# Patient Record
Sex: Male | Born: 1985
Health system: Southern US, Community
[De-identification: ages and names within clinical notes are randomized; demographics above are authoritative.]

## PROBLEM LIST (undated history)

## (undated) DIAGNOSIS — N186 End stage renal disease: Secondary | ICD-10-CM

## (undated) DIAGNOSIS — J189 Pneumonia, unspecified organism: Secondary | ICD-10-CM

## (undated) DIAGNOSIS — T462X1A Poisoning by other antidysrhythmic drugs, accidental (unintentional), initial encounter: Secondary | ICD-10-CM

## (undated) DIAGNOSIS — N289 Disorder of kidney and ureter, unspecified: Secondary | ICD-10-CM

## (undated) DIAGNOSIS — R7303 Prediabetes: Secondary | ICD-10-CM

## (undated) DIAGNOSIS — D649 Anemia, unspecified: Secondary | ICD-10-CM

## (undated) DIAGNOSIS — I4892 Unspecified atrial flutter: Secondary | ICD-10-CM

## (undated) DIAGNOSIS — I428 Other cardiomyopathies: Secondary | ICD-10-CM

## (undated) DIAGNOSIS — I480 Paroxysmal atrial fibrillation: Secondary | ICD-10-CM

## (undated) DIAGNOSIS — I5082 Biventricular heart failure: Secondary | ICD-10-CM

## (undated) DIAGNOSIS — E662 Morbid (severe) obesity with alveolar hypoventilation: Secondary | ICD-10-CM

## (undated) DIAGNOSIS — J9611 Chronic respiratory failure with hypoxia: Secondary | ICD-10-CM

## (undated) DIAGNOSIS — Z992 Dependence on renal dialysis: Secondary | ICD-10-CM

## (undated) HISTORY — PX: CARDIAC CATHETERIZATION: SHX172

## (undated) HISTORY — PX: ABSCESS DRAINAGE: SHX1119

## (undated) NOTE — *Deleted (*Deleted)
***In Progress*** PCP: [facility] Primary Cardiologist: Dr Haroldine Laws  HPI:   25 y.o.with history of cardiomyopathy and obesity. He has been known to have a cardiomyopathy (biventricular failure) since back in 2019, echo in 2019 with EF 20% [facility]). He has been followed in the [facility] system.   Admitted with increased A/C Systolic HF. Diuresed with IV furosemide and transitioned to torsemide. Placed on milrinone and had RHC/LHC. Cath showed no CAD and volume overload.  Blood gas revealed a CO2 level elevated to 78. BiPAP was initiated. HF team recommended to d/c on Farxiga 10 mg daily, digoxin 0.125 mg daily,  Corlanor 5 mg BID, losartan 12.5 mg BID, KCl 20 mEq BID, torsemide 60 mg daily, and spironolactone 25 mg daily.  He was not discharged on torsemide but discharged on all other recommended meds.  Discharge weight was 417 pounds.   He returned to clinic for HF follow up with Darrick Grinder, NP, on 04/29/20. Overall feeling much better. Able to walk up steps. Mild SOB with steps. Denied PND/Orthopnea. Using Bipap nightly.  Appetite ok. Drinking lots of fluids. No fever or chills. Weight at home 419-432 pounds. Taking all medications. Living with his mom and uncle. Drives a bus full time.  Today he returns to HF clinic for pharmacist medication titration. At last visit with Darrick Grinder, NP, on 04/29/20, carvedilol 3.125 mg BID was initiated.   Overall feeling ***. Dizziness, lightheadedness, fatigue:  Chest pain or palpitations.  How is your breathing?: *** SOB Able to complete all ADLs. Activity level ***  Weight at home pounds. Takes furosemide/torsemide/bumex *** mg *** daily.  PND/Orthopnea:   Appetite ***.   HF Medications: Carvedilol 3.125 mg BID  Entresto 24/26 mg BID  Spironolactone 25 mg daily  Dapagliflozin 10 mg daily  Digoxin 0.125 mg daily Ivabradine 5 mg BID  Torsemide 60 mg daily  Potassium Chloride 20 mEq BID  Has the patient been experiencing any side  effects to the medications prescribed?  {YES NO:22349}  Does the patient have any problems obtaining medications due to transportation or finances?   {YES [employer]  Understanding of regimen: {excellent/good/fair/poor:19665} Understanding of indications: {excellent/good/fair/poor:19665} Potential of compliance: {excellent/good/fair/poor:19665} Patient understands to avoid NSAIDs. Patient understands to avoid decongestants.    Pertinent Lab Values 04/29/20: Serum creatinine 1.07, BUN 8, Potassium 3.7, Sodium 134, BNP 368.5 (02/26/20)   Vital Signs: . Weight: *** (last clinic weight: 428 lbs) . Blood pressure: ***  . Heart rate: ***   Assessment:  1. Chronic systolic CHF: Known cardiomyopathy since 2019. Echo 02/27/2020 his admission with EF <20%, moderate LV dilation, severely decreased RV function with severe RV dilation, severe biatrial enlargement, mild MR. No history of ETOH/drugs. No FH of cardiomyopathy (mother with [place]). 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 Suspect he is too large for cardiac MRI. Narrow QRS on ECG so not CRT candidate. RHC/LHC on 03/04/20 with no CAD (nonischemic cardiomyopathy), elevated left and right heart filling pressures, and low CI at 2.1. Started on milrinone. Milrinone weaned and discontinued8/15/21. - NYHA II. Volume status stable. Continue  torsemide 60 mg once daily. Discussed limiting fluid intake to < 2 liter per day.  - Continue carvedilol 3.125 mg BID - Continue Ivabradine 5 mg BID.  - Continue digoxin 0.125 daily - Continue spironolactone 25 mg daily  - Continue Entresto 24-26 mg twice a day.  - Continue dapagliflozin 10 mg daily. - Plan to repeat ECHO in November with  Dr [name].   2. OSA: Hypercarbic respiratory failure in hospital.  -Continue Bipap every night.   3.Type 2Diabetes: - Hgb A1c 6.8 - Started dapagliflozin 03/08/20 - Followed by  primary care.    4. Recent AKI: Creatinine up to 1.5. Suspect cardiorenal +/- contrast from cath this admit - Creatinine now at baseline of 1.1  5. Hyponatremia, Na 134 on 04/29/20, now stable  6. 2nd Degree AV Block, Type 2   7. Obesity  Body mass index is 58.07 kg/m. Discussed portion control.    Plan: 1) Medication changes: Based on clinical presentation, vital signs and recent labs will *** 2) Labs: *** 3) Follow-up with Dr. [name] in 3 weeks   Audry Riles, PharmD, BCPS, BCCP, CPP Heart Failure Clinic Pharmacist [phone]

---

## 2015-03-11 ENCOUNTER — Ambulatory Visit: Payer: No Typology Code available for payment source | Attending: Chiropractic Medicine | Admitting: Physical Therapy

## 2015-04-07 ENCOUNTER — Ambulatory Visit: Payer: Self-pay | Attending: Chiropractic Medicine | Admitting: Physical Therapy

## 2015-04-07 ENCOUNTER — Encounter: Payer: Self-pay | Admitting: Physical Therapy

## 2015-04-07 DIAGNOSIS — M545 Low back pain, unspecified: Secondary | ICD-10-CM

## 2015-04-07 DIAGNOSIS — M629 Disorder of muscle, unspecified: Secondary | ICD-10-CM | POA: Insufficient documentation
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
## 2015-04-09 ENCOUNTER — Encounter: Payer: Self-pay | Admitting: Physical Therapy

## 2015-04-09 ENCOUNTER — Ambulatory Visit: Payer: Self-pay | Admitting: Physical Therapy

## 2015-04-09 DIAGNOSIS — M545 Low back pain, unspecified: Secondary | ICD-10-CM

## 2015-04-09 DIAGNOSIS — M629 Disorder of muscle, unspecified: Secondary | ICD-10-CM

## 2015-04-09 NOTE — Therapy (Signed)
[place] [address] [place], [place], [address] Phone: [phone]   Fax:  [phone]  Physical Therapy Treatment  Patient Details  Name: Phillip Young MRN: [id number] Date of Birth: [birth year] Referring Provider:  Reinaldo Meeker, DC  Encounter Date: 04/09/2015      PT End of Session - 04/09/15 1600    Visit Number 2   Date for PT Re-Evaluation 06/07/15   PT Start Time 1529   PT Stop Time 1600   PT Time Calculation (min) 31 min   Activity Tolerance Patient tolerated treatment well   Behavior During Therapy [facility] for tasks assessed/performed      History reviewed. No pertinent past medical history.  History reviewed. No pertinent past surgical history.  There were no vitals filed for this visit.  Visit Diagnosis:  Hamstring tightness of both lower extremities  Midline low back pain without sciatica      Subjective Assessment - 04/09/15 1530    Subjective Pt report waking up stiff. Pt report mild LBP after last treatment on his drive home.                          [facility] Adult PT Treatment/Exercise - 04/09/15 0001    Lumbar Exercises: Aerobic   Elliptical I 10 R 5 x 5 min    Lumbar Exercises: Machines for Strengthening   Cybex Knee Extension #45 3X10    Cybex Knee Flexion 55# 3x10   Leg Press #100 3X10    Other Lumbar Machine Exercise seated rows 65#, Lats 65# 2x 15 each   Other Lumbar Machine Exercise Standing AR press #55 10 reps 2 sets, Standing straight arm pull downs #75 3X15   Manual Therapy   Manual Therapy Passive ROM   Manual therapy comments Tight hamstrings R>L, PROM taken to end range and held   Passive ROM bilat Hip flex and ext.                  PT Short Term Goals - 04/09/15 1602    PT SHORT TERM GOAL #1   Title independent with initial HEP   Status Partially Met           PT Long Term Goals - 04/07/15 1440    PT LONG TERM GOAL #1   Title  understand proper posture and body mechanics   Time 8   Period Weeks   Status New   PT LONG TERM GOAL #2   Title decrease pain 50%   Time 8   Period Weeks   Status New   PT LONG TERM GOAL #3   Title increase ROM of the lumbar spine tpo WFL's   Time 8   Period Weeks   Status New   PT LONG TERM GOAL #4   Title lift 75# from the floor   Time 8   Period Weeks   Status New               Plan - 04/09/15 1601    Clinical Impression Statement Pt 14 minutes late for PT treatment. Pt tolerated gym level exercises well and shows good strength. Pt does show signs of fatigue with exercises. Pt bilat hamstrings very tight L>R. Pt reports low back stiffness throughout treatment   Pt will benefit from skilled therapeutic intervention in order to improve on the following deficits Cardiopulmonary status limiting activity;Decreased activity tolerance;Decreased range of motion;Increased muscle spasms;Pain  Rehab Potential Good   PT Frequency 2x / week   PT Duration 8 weeks   PT Treatment/Interventions Moist Heat;Electrical Stimulation;Traction;Therapeutic exercise;Therapeutic activities;Manual techniques;Patient/family education        Problem List There are no active problems to display for this patient.   Scot Jun, PTA  04/09/2015, 4:04 PM  [place] [address] [place], [place], [address] Phone: [phone]   Fax:  [phone]

## 2015-04-14 ENCOUNTER — Ambulatory Visit: Payer: Self-pay | Admitting: Physical Therapy

## 2015-04-17 ENCOUNTER — Encounter: Payer: Self-pay | Admitting: Physical Therapy

## 2015-04-17 ENCOUNTER — Ambulatory Visit: Payer: Self-pay | Admitting: Physical Therapy

## 2015-04-17 DIAGNOSIS — M629 Disorder of muscle, unspecified: Secondary | ICD-10-CM

## 2015-04-17 DIAGNOSIS — M545 Low back pain, unspecified: Secondary | ICD-10-CM

## 2015-04-17 NOTE — Therapy (Signed)
[place] [address] [place], [place], [address] Phone: [phone]   Fax:  [phone]  Physical Therapy Treatment  Patient Details  Name: Kaizen Ibsen MRN: [id number] Date of Birth: 1986-03-05 Referring Provider:  Reinaldo Meeker, DC  Encounter Date: 04/17/2015      PT End of Session - 04/17/15 1430    Visit Number 2   Date for PT Re-Evaluation 06/07/15   PT Start Time 5465   PT Stop Time 1430   PT Time Calculation (min) 31 min   Activity Tolerance Patient tolerated treatment well   Behavior During Therapy [facility] for tasks assessed/performed      History reviewed. No pertinent past medical history.  History reviewed. No pertinent past surgical history.  There were no vitals filed for this visit.  Visit Diagnosis:  Hamstring tightness of both lower extremities  Midline low back pain without sciatica      Subjective Assessment - 04/17/15 1402    Subjective Pt reports that he has no pain today but does have some stiffness in low back. Pt does report little low back pain in the mornings.   Currently in Pain? No/denies   Pain Score 0-No pain                         [facility] Adult PT Treatment/Exercise - 04/17/15 0001    Lumbar Exercises: Aerobic   Elliptical I 10 R 5 x 6 min    Lumbar Exercises: Machines for Strengthening   Cybex Knee Extension #65 2X15    Cybex Knee Flexion 65# 2x15   Leg Press #120 3X15   Other Lumbar Machine Exercise seated rows 85# 3X15, Lats 85# 2x15    Other Lumbar Machine Exercise Standing AR press #55 10 reps 2 sets, Standing straight arm pull downs #75 3X15   Lumbar Exercises: Standing   Other Standing Lumbar Exercises Dead lifts #20 2X15   Shoulder Exercises: Standing   Row 15 reps;Both;PROM;Strengthening;Weights  2 sets    Row Weight (lbs) 85                  PT Short Term Goals - 04/09/15 1602    PT SHORT TERM GOAL #1   Title independent with  initial HEP   Status Partially Met           PT Long Term Goals - 04/17/15 1434    PT LONG TERM GOAL #1   Title understand proper posture and body mechanics   Status Partially Met               Plan - 04/17/15 1432    Clinical Impression Statement Pr again 15 minutes late for PR treatment. Reports tightness and pain in his low back in the morning that improves once he gets moving. Pt continues to have decrease activity tolerance with exercises but displays good strength.   Pt will benefit from skilled therapeutic intervention in order to improve on the following deficits Cardiopulmonary status limiting activity;Decreased activity tolerance;Decreased range of motion;Increased muscle spasms;Pain   Rehab Potential Good   PT Frequency 2x / week   PT Duration 8 weeks   PT Treatment/Interventions Moist Heat;Electrical Stimulation;Traction;Therapeutic exercise;Therapeutic activities;Manual techniques;Patient/family education   PT Next Visit Plan continue to add exercises        Problem List There are no active problems to display for this patient.   Scot Jun, PTA  04/17/2015, 2:34 PM  [place] [address] [place], [place], [address] Phone: [phone]   Fax:  [phone]

## 2015-04-21 ENCOUNTER — Ambulatory Visit: Payer: Self-pay | Admitting: Physical Therapy

## 2015-04-21 ENCOUNTER — Encounter: Payer: Self-pay | Admitting: Physical Therapy

## 2015-04-21 DIAGNOSIS — M545 Low back pain, unspecified: Secondary | ICD-10-CM

## 2015-04-21 DIAGNOSIS — M629 Disorder of muscle, unspecified: Secondary | ICD-10-CM

## 2015-04-21 NOTE — Therapy (Signed)
[place] [address] [place], [place], [address] Phone: [phone]   Fax:  [phone]  Physical Therapy Treatment  Patient Details  Name: Phillip Young MRN: [id number] Date of Birth: 09-03-1985 Referring Sarina Robleto:  Reinaldo Meeker, DC  Encounter Date: 04/21/2015      PT End of Session - 04/21/15 0920    Visit Number 3   Date for PT Re-Evaluation 06/07/15   PT Start Time 0838   PT Stop Time 0920   PT Time Calculation (min) 42 min   Activity Tolerance Patient tolerated treatment well;Patient limited by fatigue   Behavior During Therapy [facility] for tasks assessed/performed      History reviewed. No pertinent past medical history.  History reviewed. No pertinent past surgical history.  There were no vitals filed for this visit.  Visit Diagnosis:  Hamstring tightness of both lower extremities  Midline low back pain without sciatica      Subjective Assessment - 04/21/15 0839    Subjective Pt reports mild LPB when he breathes, Pt also reports that he cant sleep on his side.    Pain Score 5    Pain Location Back   Pain Orientation Lower                         [facility] Adult PT Treatment/Exercise - 04/21/15 0001    Exercises   Exercises Lumbar;Knee/Hip   Lumbar Exercises: Aerobic   Elliptical I 10 R 5 x 7 min    Lumbar Exercises: Machines for Strengthening   Cybex Knee Extension #75 2X15    Cybex Knee Flexion 75# 2x15   Leg Press #140 3X15   Other Lumbar Machine Exercise seated rows 95# 3X10, Lats 95# 3x10    Other Lumbar Machine Exercise Standing AR press #55 10 reps 2 sets, Standing straight arm pull downs #75 3X15   Lumbar Exercises: Standing   Other Standing Lumbar Exercises Functional carry 25lb x4, 3 flights of stairs #20    Lumbar Exercises: Prone   Other Prone Lumbar Exercises Plank 30 sex X3                   PT Short Term Goals - 04/21/15 3893    PT SHORT TERM GOAL  #1   Title independent with initial HEP   Status Achieved           PT Long Term Goals - 04/17/15 1434    PT LONG TERM GOAL #1   Title understand proper posture and body mechanics   Status Partially Met               Plan - 04/21/15 0920    Clinical Impression Statement Pt demo ed a decrease activity tolerance this day with exercises. Pt shows good strength but requires multiple rest breaks. Pt able to progress to functional carry as well as stair negotiation with weight.   Pt will benefit from skilled therapeutic intervention in order to improve on the following deficits Cardiopulmonary status limiting activity;Decreased activity tolerance;Decreased range of motion;Increased muscle spasms;Pain   PT Frequency 2x / week   PT Duration 8 weeks   PT Treatment/Interventions Moist Heat;Electrical Stimulation;Traction;Therapeutic exercise;Therapeutic activities;Manual techniques;Patient/family education   PT Next Visit Plan Core strengthening         Problem List There are no active problems to display for this patient.   Scot Jun , PTA   04/21/2015, 9:22 AM  [facility]  [address] [place], [place], [address] Phone: [phone]   Fax:  [phone]

## 2015-04-25 ENCOUNTER — Ambulatory Visit: Payer: Self-pay | Admitting: Physical Therapy

## 2015-04-25 ENCOUNTER — Encounter: Payer: Self-pay | Admitting: Physical Therapy

## 2015-04-25 DIAGNOSIS — M545 Low back pain, unspecified: Secondary | ICD-10-CM

## 2015-04-25 DIAGNOSIS — M629 Disorder of muscle, unspecified: Secondary | ICD-10-CM

## 2015-04-25 NOTE — Therapy (Addendum)
[place] [address] [place], [place], [address] Phone: [phone]   Fax:  [phone]  Physical Therapy Treatment  Patient Details  Name: Phillip Young MRN: [id number] Date of Birth: [birth year] Referring Provider:  Reinaldo Meeker, DC  Encounter Date: 04/25/2015      PT End of Session - 04/25/15 0926    Visit Number 4   Date for PT Re-Evaluation 06/07/15   PT Start Time 3790   PT Stop Time 0927   PT Time Calculation (min) 32 min   Activity Tolerance Patient tolerated treatment well;Patient limited by fatigue   Behavior During Therapy [facility] for tasks assessed/performed      History reviewed. No pertinent past medical history.  History reviewed. No pertinent past surgical history.  There were no vitals filed for this visit.  Visit Diagnosis:  Hamstring tightness of both lower extremities  Midline low back pain without sciatica      Subjective Assessment - 04/25/15 0852    Subjective Pt reports that everything is good but their is always pain   Diagnostic tests x-rays negative   Currently in Pain? Yes   Pain Score 2    Pain Location Back                         [facility] Adult PT Treatment/Exercise - 04/25/15 0001    Lumbar Exercises: Aerobic   Elliptical I 10 R 10 x 7 min    Lumbar Exercises: Machines for Strengthening   Cybex Knee Extension #85 3X15    Cybex Knee Flexion 85# 2x15   Leg Press #160 3X15   Other Lumbar Machine Exercise seated rows 105# 3X10, Lats 105# 3x10    Other Lumbar Machine Exercise Standing trunk rotation #45 2X10    Lumbar Exercises: Standing   Other Standing Lumbar Exercises Functional carry 25lb x2 up and down stairs    Lumbar Exercises: Prone   Other Prone Lumbar Exercises Plank 30 sex X3                   PT Short Term Goals - 04/21/15 2409    PT SHORT TERM GOAL #1   Title independent with initial HEP   Status Achieved           PT  Long Term Goals - 04/25/15 0929    PT LONG TERM GOAL #2   Title decrease pain 50%   Status Partially Met   PT LONG TERM GOAL #3   Title increase ROM of the lumbar spine tpo WFL's   Status Partially Met               Plan - 04/25/15 0927    Clinical Impression Statement Pt 10 minutes late for PT treatment. Pt tolerated all activities well. Pt able shows good strength and able to progress to functional carry up and down stairs. Pt reports all mobility at home is good but he still has LBP   Pt will benefit from skilled therapeutic intervention in order to improve on the following deficits Cardiopulmonary status limiting activity;Decreased activity tolerance;Decreased range of motion;Increased muscle spasms;Pain   Rehab Potential Good   PT Frequency 2x / week   PT Duration 8 weeks   PT Treatment/Interventions Moist Heat;Electrical Stimulation;Traction;Therapeutic exercise;Therapeutic activities;Manual techniques;Patient/family education   PT Next Visit Plan Core strengthening        PHYSICAL THERAPY DISCHARGE SUMMARY  Visits from Start of Care: 4  Plan: Patient agrees to discharge.  Patient goals were partially met. Patient is being discharged due to not returning since the last visit.  ?????       Problem List There are no active problems to display for this patient.   Scot Jun, PTA  04/25/2015, 9:30 AM  [name] [place], [place], [address] Phone: [phone]   Fax:  [phone]

## 2015-04-30 ENCOUNTER — Ambulatory Visit: Payer: Self-pay | Attending: Chiropractic Medicine | Admitting: Physical Therapy

## 2015-04-30 ENCOUNTER — Encounter: Payer: Self-pay | Admitting: Physical Therapy

## 2015-04-30 DIAGNOSIS — M629 Disorder of muscle, unspecified: Secondary | ICD-10-CM | POA: Insufficient documentation

## 2015-04-30 DIAGNOSIS — M545 Low back pain, unspecified: Secondary | ICD-10-CM

## 2015-04-30 NOTE — Therapy (Signed)
[address] [place], [place], [address] Phone: [phone]   Fax:  [phone]  Physical Therapy Treatment  Patient Details  Name: Phillip Young MRN: [id number] Date of Birth: 1986/01/12 Referring Provider:  Reinaldo Meeker, DC  Encounter Date: 04/30/2015      PT End of Session - 04/30/15 0922    Visit Number 5   Date for PT Re-Evaluation 06/07/15   PT Start Time 0840   PT Stop Time 0922   PT Time Calculation (min) 42 min   Activity Tolerance Patient tolerated treatment well;Patient limited by fatigue   Behavior During Therapy [facility] for tasks assessed/performed      History reviewed. No pertinent past medical history.  History reviewed. No pertinent past surgical history.  There were no vitals filed for this visit.  Visit Diagnosis:  Hamstring tightness of both lower extremities  Midline low back pain without sciatica      Subjective Assessment - 04/30/15 0840    Subjective Pt reports pain in low back, but has minor improvements    Currently in Pain? Yes   Pain Score 2    Pain Location Back                         [facility] Adult PT Treatment/Exercise - 04/30/15 0001    Lumbar Exercises: Stretches   Passive Hamstring Stretch 5 reps;10 seconds   Single Knee to Chest Stretch 1 rep;10 seconds   Quad Stretch 1 rep;10 seconds   Piriformis Stretch 10 seconds;1 rep   Lumbar Exercises: Aerobic   Elliptical I 10 R 10 x 7 min    Lumbar Exercises: Machines for Strengthening   Cybex Knee Extension #85 2X15    Cybex Knee Flexion 85# 2x15   Leg Press #200 3X15   Other Lumbar Machine Exercise seated rows 115# 2X15, Lats 115# 3x10    Other Lumbar Machine Exercise Standing trunk rotation #45 2X10    Lumbar Exercises: Standing   Row Power tower;AROM;Strengthening;Both;15 reps  2 sets   Row Limitations 105lb   Other Standing Lumbar Exercises Functional carry 25lb x2 up and down stairs    Lumbar Exercises: Prone   Other Prone Lumbar Exercises Plank 45 sec X3                   PT Short Term Goals - 04/21/15 [id number]    PT SHORT TERM GOAL #1   Title independent with initial HEP   Status Achieved           PT Long Term Goals - 04/30/15 0924    PT LONG TERM GOAL #1   Title understand proper posture and body mechanics   Status Achieved               Plan - 04/30/15 0923    Clinical Impression Statement Pt continues to report low back pain, tight bilat hamstrings. Demos good strength and able to complete all interventions. Fatigues easily frequent rest breaks.   Pt will benefit from skilled therapeutic intervention in order to improve on the following deficits Cardiopulmonary status limiting activity;Decreased activity tolerance;Decreased range of motion;Increased muscle spasms;Pain   Rehab Potential Good   PT Frequency 2x / week   PT Duration 8 weeks   PT Treatment/Interventions Moist Heat;Electrical Stimulation;Traction;Therapeutic exercise;Therapeutic activities;Manual techniques;Patient/family education   PT Next Visit Plan Core strengthening         Problem List There are no  active problems to display for this patient.   Scot Jun, PTA   04/30/2015, 9:25 AM  [address] [place], [place], [address] Phone: [phone]   Fax:  [phone]

## 2019-10-15 DIAGNOSIS — G4730 Sleep apnea, unspecified: Secondary | ICD-10-CM | POA: Diagnosis not present

## 2019-10-15 DIAGNOSIS — I5022 Chronic systolic (congestive) heart failure: Secondary | ICD-10-CM | POA: Diagnosis not present

## 2019-10-15 DIAGNOSIS — D508 Other iron deficiency anemias: Secondary | ICD-10-CM | POA: Diagnosis not present

## 2019-10-15 DIAGNOSIS — I110 Hypertensive heart disease with heart failure: Secondary | ICD-10-CM | POA: Diagnosis not present

## 2019-11-01 DIAGNOSIS — R05 Cough: Secondary | ICD-10-CM | POA: Diagnosis not present

## 2019-11-01 DIAGNOSIS — D509 Iron deficiency anemia, unspecified: Secondary | ICD-10-CM | POA: Diagnosis not present

## 2019-11-01 DIAGNOSIS — Z6841 Body Mass Index (BMI) 40.0 and over, adult: Secondary | ICD-10-CM | POA: Diagnosis not present

## 2019-11-05 DIAGNOSIS — I517 Cardiomegaly: Secondary | ICD-10-CM | POA: Diagnosis not present

## 2019-11-05 DIAGNOSIS — R05 Cough: Secondary | ICD-10-CM | POA: Diagnosis not present

## 2019-11-12 DIAGNOSIS — R1310 Dysphagia, unspecified: Secondary | ICD-10-CM | POA: Diagnosis not present

## 2019-11-12 DIAGNOSIS — R05 Cough: Secondary | ICD-10-CM | POA: Diagnosis not present

## 2019-11-12 DIAGNOSIS — D509 Iron deficiency anemia, unspecified: Secondary | ICD-10-CM | POA: Diagnosis not present

## 2019-12-04 DIAGNOSIS — G4730 Sleep apnea, unspecified: Secondary | ICD-10-CM | POA: Diagnosis not present

## 2019-12-04 DIAGNOSIS — R0681 Apnea, not elsewhere classified: Secondary | ICD-10-CM | POA: Diagnosis not present

## 2019-12-04 DIAGNOSIS — R400 Somnolence: Secondary | ICD-10-CM | POA: Diagnosis not present

## 2019-12-10 DIAGNOSIS — K449 Diaphragmatic hernia without obstruction or gangrene: Secondary | ICD-10-CM | POA: Diagnosis not present

## 2019-12-19 DIAGNOSIS — R0789 Other chest pain: Secondary | ICD-10-CM | POA: Diagnosis not present

## 2019-12-19 DIAGNOSIS — I5020 Unspecified systolic (congestive) heart failure: Secondary | ICD-10-CM | POA: Diagnosis not present

## 2019-12-19 DIAGNOSIS — Z87891 Personal history of nicotine dependence: Secondary | ICD-10-CM | POA: Diagnosis not present

## 2019-12-19 DIAGNOSIS — I5023 Acute on chronic systolic (congestive) heart failure: Secondary | ICD-10-CM | POA: Diagnosis not present

## 2019-12-19 DIAGNOSIS — Z6841 Body Mass Index (BMI) 40.0 and over, adult: Secondary | ICD-10-CM | POA: Diagnosis not present

## 2019-12-19 DIAGNOSIS — L732 Hidradenitis suppurativa: Secondary | ICD-10-CM | POA: Diagnosis not present

## 2019-12-19 DIAGNOSIS — D649 Anemia, unspecified: Secondary | ICD-10-CM | POA: Diagnosis not present

## 2019-12-19 DIAGNOSIS — I420 Dilated cardiomyopathy: Secondary | ICD-10-CM | POA: Diagnosis not present

## 2019-12-19 DIAGNOSIS — K219 Gastro-esophageal reflux disease without esophagitis: Secondary | ICD-10-CM | POA: Diagnosis not present

## 2019-12-19 DIAGNOSIS — I110 Hypertensive heart disease with heart failure: Secondary | ICD-10-CM | POA: Diagnosis not present

## 2019-12-19 DIAGNOSIS — L0591 Pilonidal cyst without abscess: Secondary | ICD-10-CM | POA: Diagnosis not present

## 2019-12-19 DIAGNOSIS — R1013 Epigastric pain: Secondary | ICD-10-CM | POA: Diagnosis not present

## 2019-12-19 DIAGNOSIS — R000 Tachycardia, unspecified: Secondary | ICD-10-CM | POA: Diagnosis not present

## 2019-12-19 DIAGNOSIS — Z79899 Other long term (current) drug therapy: Secondary | ICD-10-CM | POA: Diagnosis not present

## 2019-12-19 DIAGNOSIS — R109 Unspecified abdominal pain: Secondary | ICD-10-CM | POA: Diagnosis not present

## 2019-12-19 DIAGNOSIS — R072 Precordial pain: Secondary | ICD-10-CM | POA: Diagnosis not present

## 2019-12-19 DIAGNOSIS — I517 Cardiomegaly: Secondary | ICD-10-CM | POA: Diagnosis not present

## 2019-12-19 DIAGNOSIS — L0501 Pilonidal cyst with abscess: Secondary | ICD-10-CM | POA: Diagnosis not present

## 2019-12-19 DIAGNOSIS — R0602 Shortness of breath: Secondary | ICD-10-CM | POA: Diagnosis not present

## 2019-12-19 DIAGNOSIS — R05 Cough: Secondary | ICD-10-CM | POA: Diagnosis not present

## 2019-12-19 DIAGNOSIS — G4733 Obstructive sleep apnea (adult) (pediatric): Secondary | ICD-10-CM | POA: Diagnosis not present

## 2019-12-23 MED ORDER — LOSARTAN POTASSIUM 25 MG PO TABS
12.50 | ORAL_TABLET | ORAL | Status: DC
Start: 2019-12-23 — End: 2019-12-23

## 2019-12-23 MED ORDER — PROMETHAZINE HCL 25 MG PO TABS
25.00 | ORAL_TABLET | ORAL | Status: DC
Start: ? — End: 2019-12-23

## 2019-12-23 MED ORDER — ONDANSETRON HCL 4 MG/2ML IJ SOLN
4.00 | INTRAMUSCULAR | Status: DC
Start: ? — End: 2019-12-23

## 2019-12-23 MED ORDER — SPIRONOLACTONE 25 MG PO TABS
12.50 | ORAL_TABLET | ORAL | Status: DC
Start: 2019-12-23 — End: 2019-12-23

## 2019-12-23 MED ORDER — METOPROLOL SUCCINATE ER 25 MG PO TB24
25.00 | ORAL_TABLET | ORAL | Status: DC
Start: 2019-12-23 — End: 2019-12-23

## 2019-12-23 MED ORDER — OXYCODONE-ACETAMINOPHEN 10-325 MG PO TABS
1.00 | ORAL_TABLET | ORAL | Status: DC
Start: ? — End: 2019-12-23

## 2019-12-23 MED ORDER — FAMOTIDINE 20 MG PO TABS
20.00 | ORAL_TABLET | ORAL | Status: DC
Start: 2019-12-22 — End: 2019-12-23

## 2019-12-23 MED ORDER — ACETAMINOPHEN 325 MG PO TABS
650.00 | ORAL_TABLET | ORAL | Status: DC
Start: ? — End: 2019-12-23

## 2019-12-23 MED ORDER — MORPHINE SULFATE 2 MG/ML IJ SOLN
2.00 | INTRAMUSCULAR | Status: DC
Start: ? — End: 2019-12-23

## 2019-12-23 MED ORDER — FUROSEMIDE 10 MG/ML IJ SOLN
40.00 | INTRAMUSCULAR | Status: DC
Start: 2019-12-23 — End: 2019-12-23

## 2019-12-23 MED ORDER — HYDROCODONE-ACETAMINOPHEN 5-325 MG PO TABS
1.00 | ORAL_TABLET | ORAL | Status: DC
Start: ? — End: 2019-12-23

## 2019-12-23 MED ORDER — BENZONATATE 100 MG PO CAPS
100.00 | ORAL_CAPSULE | ORAL | Status: DC
Start: ? — End: 2019-12-23

## 2019-12-23 MED ORDER — BENZOCAINE-MENTHOL 6-10 MG MT LOZG
1.00 | LOZENGE | OROMUCOSAL | Status: DC
Start: ? — End: 2019-12-23

## 2020-01-07 DIAGNOSIS — L0501 Pilonidal cyst with abscess: Secondary | ICD-10-CM | POA: Diagnosis not present

## 2020-01-07 DIAGNOSIS — Z8679 Personal history of other diseases of the circulatory system: Secondary | ICD-10-CM | POA: Diagnosis not present

## 2020-01-16 DIAGNOSIS — I509 Heart failure, unspecified: Secondary | ICD-10-CM | POA: Diagnosis not present

## 2020-01-16 DIAGNOSIS — L0501 Pilonidal cyst with abscess: Secondary | ICD-10-CM | POA: Diagnosis not present

## 2020-01-16 DIAGNOSIS — L732 Hidradenitis suppurativa: Secondary | ICD-10-CM | POA: Diagnosis not present

## 2020-01-16 DIAGNOSIS — E119 Type 2 diabetes mellitus without complications: Secondary | ICD-10-CM | POA: Diagnosis not present

## 2020-02-18 DIAGNOSIS — J90 Pleural effusion, not elsewhere classified: Secondary | ICD-10-CM | POA: Diagnosis not present

## 2020-02-18 DIAGNOSIS — R109 Unspecified abdominal pain: Secondary | ICD-10-CM | POA: Diagnosis not present

## 2020-02-18 DIAGNOSIS — R0602 Shortness of breath: Secondary | ICD-10-CM | POA: Diagnosis not present

## 2020-02-18 DIAGNOSIS — R05 Cough: Secondary | ICD-10-CM | POA: Diagnosis not present

## 2020-02-18 DIAGNOSIS — I517 Cardiomegaly: Secondary | ICD-10-CM | POA: Diagnosis not present

## 2020-02-18 DIAGNOSIS — R1084 Generalized abdominal pain: Secondary | ICD-10-CM | POA: Diagnosis not present

## 2020-02-18 DIAGNOSIS — M7989 Other specified soft tissue disorders: Secondary | ICD-10-CM | POA: Diagnosis not present

## 2020-02-19 DIAGNOSIS — M7989 Other specified soft tissue disorders: Secondary | ICD-10-CM | POA: Diagnosis not present

## 2020-02-19 DIAGNOSIS — R05 Cough: Secondary | ICD-10-CM | POA: Diagnosis not present

## 2020-02-19 DIAGNOSIS — R000 Tachycardia, unspecified: Secondary | ICD-10-CM | POA: Diagnosis not present

## 2020-02-19 DIAGNOSIS — R109 Unspecified abdominal pain: Secondary | ICD-10-CM | POA: Diagnosis not present

## 2020-02-19 DIAGNOSIS — R1084 Generalized abdominal pain: Secondary | ICD-10-CM | POA: Diagnosis not present

## 2020-02-19 DIAGNOSIS — R0602 Shortness of breath: Secondary | ICD-10-CM | POA: Diagnosis not present

## 2020-02-26 ENCOUNTER — Inpatient Hospital Stay (HOSPITAL_COMMUNITY)
Admission: EM | Admit: 2020-02-26 | Discharge: 2020-03-11 | DRG: 286 | Disposition: A | Discharge status: Home / Self Care | Payer: BC Managed Care – PPO | Attending: Internal Medicine | Admitting: Internal Medicine

## 2020-02-26 ENCOUNTER — Emergency Department (HOSPITAL_COMMUNITY): Payer: BC Managed Care – PPO

## 2020-02-26 ENCOUNTER — Encounter (HOSPITAL_COMMUNITY): Payer: Self-pay | Admitting: Internal Medicine

## 2020-02-26 ENCOUNTER — Other Ambulatory Visit: Payer: Self-pay

## 2020-02-26 DIAGNOSIS — E119 Type 2 diabetes mellitus without complications: Secondary | ICD-10-CM | POA: Diagnosis not present

## 2020-02-26 DIAGNOSIS — R7303 Prediabetes: Secondary | ICD-10-CM

## 2020-02-26 DIAGNOSIS — I371 Nonrheumatic pulmonary valve insufficiency: Secondary | ICD-10-CM | POA: Diagnosis not present

## 2020-02-26 DIAGNOSIS — I441 Atrioventricular block, second degree: Secondary | ICD-10-CM | POA: Diagnosis present

## 2020-02-26 DIAGNOSIS — I509 Heart failure, unspecified: Secondary | ICD-10-CM | POA: Diagnosis not present

## 2020-02-26 DIAGNOSIS — I517 Cardiomegaly: Secondary | ICD-10-CM | POA: Diagnosis not present

## 2020-02-26 DIAGNOSIS — R053 Chronic cough: Secondary | ICD-10-CM

## 2020-02-26 DIAGNOSIS — E662 Morbid (severe) obesity with alveolar hypoventilation: Secondary | ICD-10-CM | POA: Diagnosis present

## 2020-02-26 DIAGNOSIS — I5043 Acute on chronic combined systolic (congestive) and diastolic (congestive) heart failure: Secondary | ICD-10-CM | POA: Diagnosis not present

## 2020-02-26 DIAGNOSIS — I5023 Acute on chronic systolic (congestive) heart failure: Secondary | ICD-10-CM | POA: Diagnosis present

## 2020-02-26 DIAGNOSIS — Z8249 Family history of ischemic heart disease and other diseases of the circulatory system: Secondary | ICD-10-CM | POA: Diagnosis not present

## 2020-02-26 DIAGNOSIS — Z6841 Body Mass Index (BMI) 40.0 and over, adult: Secondary | ICD-10-CM

## 2020-02-26 DIAGNOSIS — I110 Hypertensive heart disease with heart failure: Secondary | ICD-10-CM | POA: Diagnosis not present

## 2020-02-26 DIAGNOSIS — R05 Cough: Secondary | ICD-10-CM | POA: Diagnosis not present

## 2020-02-26 DIAGNOSIS — I5082 Biventricular heart failure: Secondary | ICD-10-CM | POA: Diagnosis not present

## 2020-02-26 DIAGNOSIS — J9602 Acute respiratory failure with hypercapnia: Secondary | ICD-10-CM | POA: Diagnosis not present

## 2020-02-26 DIAGNOSIS — G4733 Obstructive sleep apnea (adult) (pediatric): Secondary | ICD-10-CM | POA: Diagnosis not present

## 2020-02-26 DIAGNOSIS — Z79899 Other long term (current) drug therapy: Secondary | ICD-10-CM

## 2020-02-26 DIAGNOSIS — I959 Hypotension, unspecified: Secondary | ICD-10-CM | POA: Diagnosis present

## 2020-02-26 DIAGNOSIS — J9610 Chronic respiratory failure, unspecified whether with hypoxia or hypercapnia: Secondary | ICD-10-CM | POA: Diagnosis not present

## 2020-02-26 DIAGNOSIS — Z87891 Personal history of nicotine dependence: Secondary | ICD-10-CM

## 2020-02-26 DIAGNOSIS — R0602 Shortness of breath: Secondary | ICD-10-CM | POA: Diagnosis not present

## 2020-02-26 DIAGNOSIS — E871 Hypo-osmolality and hyponatremia: Secondary | ICD-10-CM | POA: Diagnosis not present

## 2020-02-26 DIAGNOSIS — L732 Hidradenitis suppurativa: Secondary | ICD-10-CM | POA: Diagnosis present

## 2020-02-26 DIAGNOSIS — I429 Cardiomyopathy, unspecified: Secondary | ICD-10-CM | POA: Diagnosis not present

## 2020-02-26 DIAGNOSIS — Z20822 Contact with and (suspected) exposure to covid-19: Secondary | ICD-10-CM | POA: Diagnosis not present

## 2020-02-26 DIAGNOSIS — Z9119 Patient's noncompliance with other medical treatment and regimen: Secondary | ICD-10-CM

## 2020-02-26 DIAGNOSIS — E876 Hypokalemia: Secondary | ICD-10-CM

## 2020-02-26 DIAGNOSIS — I5022 Chronic systolic (congestive) heart failure: Secondary | ICD-10-CM

## 2020-02-26 DIAGNOSIS — N179 Acute kidney failure, unspecified: Secondary | ICD-10-CM | POA: Diagnosis not present

## 2020-02-26 DIAGNOSIS — K219 Gastro-esophageal reflux disease without esophagitis: Secondary | ICD-10-CM | POA: Diagnosis not present

## 2020-02-26 DIAGNOSIS — J9601 Acute respiratory failure with hypoxia: Secondary | ICD-10-CM | POA: Diagnosis present

## 2020-02-26 DIAGNOSIS — I10 Essential (primary) hypertension: Secondary | ICD-10-CM

## 2020-02-26 DIAGNOSIS — I340 Nonrheumatic mitral (valve) insufficiency: Secondary | ICD-10-CM | POA: Diagnosis not present

## 2020-02-26 DIAGNOSIS — T502X5A Adverse effect of carbonic-anhydrase inhibitors, benzothiadiazides and other diuretics, initial encounter: Secondary | ICD-10-CM | POA: Diagnosis present

## 2020-02-26 DIAGNOSIS — I428 Other cardiomyopathies: Secondary | ICD-10-CM | POA: Diagnosis not present

## 2020-02-26 HISTORY — DX: Biventricular heart failure: I50.82

## 2020-02-26 HISTORY — DX: Gastro-esophageal reflux disease without esophagitis: K21.9

## 2020-02-26 HISTORY — DX: Prediabetes: R73.03

## 2020-02-26 HISTORY — DX: Body Mass Index (BMI) 40.0 and over, adult: Z684

## 2020-02-26 HISTORY — DX: Essential (primary) hypertension: I10

## 2020-02-26 HISTORY — DX: Hidradenitis suppurativa: L73.2

## 2020-02-26 HISTORY — DX: Morbid (severe) obesity due to excess calories: E66.01

## 2020-02-26 HISTORY — DX: Obstructive sleep apnea (adult) (pediatric): G47.33

## 2020-02-26 HISTORY — DX: Acute on chronic systolic (congestive) heart failure: I50.23

## 2020-02-26 LAB — CBC WITH DIFFERENTIAL/PLATELET
Abs Immature Granulocytes: 0.04 10*3/uL (ref 0.00–0.07)
Basophils Absolute: 0.1 10*3/uL (ref 0.0–0.1)
Basophils Relative: 1 %
Eosinophils Absolute: 0.3 10*3/uL (ref 0.0–0.5)
Eosinophils Relative: 3 %
HCT: 40.8 % (ref 39.0–52.0)
Hemoglobin: 12.7 g/dL — ABNORMAL LOW (ref 13.0–17.0)
Immature Granulocytes: 1 %
Lymphocytes Relative: 17 %
Lymphs Abs: 1.4 10*3/uL (ref 0.7–4.0)
MCH: 28.5 pg (ref 26.0–34.0)
MCHC: 31.1 g/dL (ref 30.0–36.0)
MCV: 91.5 fL (ref 80.0–100.0)
Monocytes Absolute: 0.5 10*3/uL (ref 0.1–1.0)
Monocytes Relative: 6 %
Neutro Abs: 6.2 10*3/uL (ref 1.7–7.7)
Neutrophils Relative %: 72 %
Platelets: 313 10*3/uL (ref 150–400)
RBC: 4.46 MIL/uL (ref 4.22–5.81)
RDW: 15.9 % — ABNORMAL HIGH (ref 11.5–15.5)
WBC: 8.6 10*3/uL (ref 4.0–10.5)
nRBC: 0 % (ref 0.0–0.2)

## 2020-02-26 LAB — BASIC METABOLIC PANEL
Anion gap: 12 (ref 5–15)
BUN: 8 mg/dL (ref 6–20)
CO2: 29 mmol/L (ref 22–32)
Calcium: 9 mg/dL (ref 8.9–10.3)
Chloride: 95 mmol/L — ABNORMAL LOW (ref 98–111)
Creatinine, Ser: 1.08 mg/dL (ref 0.61–1.24)
GFR calc Af Amer: >60 mL/min (ref >60)
GFR calc non Af Amer: >60 mL/min (ref >60)
Glucose, Bld: 102 mg/dL — ABNORMAL HIGH (ref 70–99)
Potassium: 3.1 mmol/L — ABNORMAL LOW (ref 3.5–5.1)
Sodium: 136 mmol/L (ref 135–145)

## 2020-02-26 LAB — BRAIN NATRIURETIC PEPTIDE: B Natriuretic Peptide: 368.5 pg/mL — ABNORMAL HIGH (ref 0.0–100.0)

## 2020-02-26 LAB — SARS CORONAVIRUS 2 BY RT PCR (HOSPITAL ORDER, PERFORMED IN ~~LOC~~ HOSPITAL LAB): SARS Coronavirus 2: NEGATIVE

## 2020-02-26 IMAGING — DX DG CHEST 1V PORT
1 series · 1 of 1 positions shown · non-contrast
Comparison: [DATE]

CLINICAL DATA: Cough and possible COVID

EXAM:
PORTABLE CHEST 1 VIEW

[chest ap]
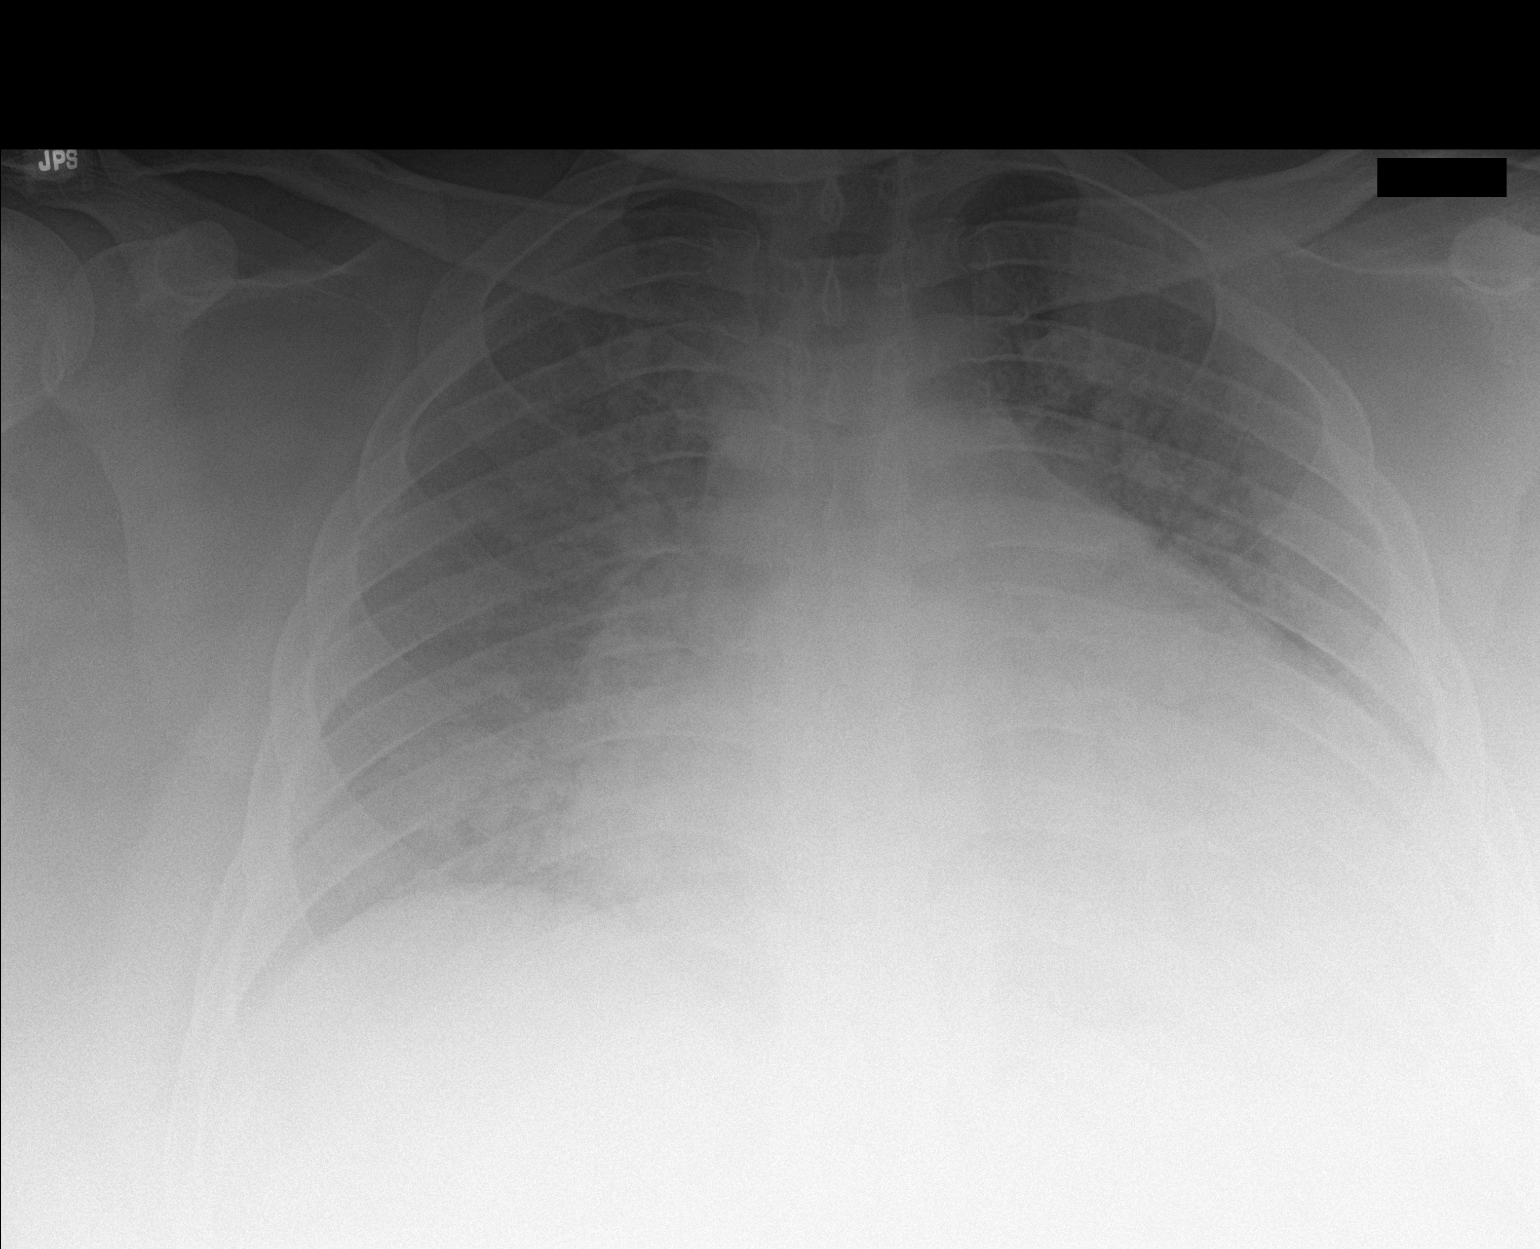

[1 of 1 positions shown; findings below may reference images not displayed]

FINDINGS: Cardiopericardial enlargement. Diffuse hazy opacification of the
chest which is likely vascular congestion and soft tissue
attenuation. No visible effusion or pneumothorax
IMPRESSION: Cardiomegaly and vascular congestion.  No focal pneumonia.

## 2020-02-26 MED ORDER — PANTOPRAZOLE SODIUM 40 MG PO TBEC
40.0000 mg | DELAYED_RELEASE_TABLET | Freq: Every day | ORAL | Status: DC
  Administered 2020-02-27 – 2020-03-11 (×14): 40 mg via ORAL
  Filled 2020-02-26 (×14): qty 1

## 2020-02-26 MED ORDER — SODIUM CHLORIDE 0.9% FLUSH
3.0000 mL | Freq: Two times a day (BID) | INTRAVENOUS | Status: DC
  Administered 2020-02-27 – 2020-03-09 (×14): 3 mL via INTRAVENOUS

## 2020-02-26 MED ORDER — ACETAMINOPHEN 325 MG PO TABS
650.0000 mg | ORAL_TABLET | ORAL | Status: DC | PRN
  Administered 2020-02-27 – 2020-02-29 (×4): 650 mg via ORAL
  Filled 2020-02-26 (×4): qty 2

## 2020-02-26 MED ORDER — FUROSEMIDE 10 MG/ML IJ SOLN
60.0000 mg | Freq: Two times a day (BID) | INTRAMUSCULAR | Status: DC
  Administered 2020-02-27 – 2020-03-02 (×10): 60 mg via INTRAVENOUS
  Filled 2020-02-26 (×10): qty 6

## 2020-02-26 MED ORDER — POLYETHYLENE GLYCOL 3350 17 G PO PACK: 17.0000 g | PACK | Freq: Every day | ORAL | Status: DC | PRN

## 2020-02-26 MED ORDER — SODIUM CHLORIDE 0.9 % IV SOLN: 250.0000 mL | INTRAVENOUS | Status: DC | PRN

## 2020-02-26 MED ORDER — POTASSIUM CHLORIDE CRYS ER 20 MEQ PO TBCR
20.0000 meq | EXTENDED_RELEASE_TABLET | Freq: Every day | ORAL | 0 refills | Status: DC
Start: 2020-02-26 — End: 2020-03-11

## 2020-02-26 MED ORDER — ENOXAPARIN SODIUM 100 MG/ML ~~LOC~~ SOLN
100.0000 mg | Freq: Every day | SUBCUTANEOUS | Status: DC
  Administered 2020-02-27: 100 mg via SUBCUTANEOUS
  Filled 2020-02-26: qty 1

## 2020-02-26 MED ORDER — LOSARTAN POTASSIUM 25 MG PO TABS
25.0000 mg | ORAL_TABLET | Freq: Every day | ORAL | Status: DC
  Administered 2020-02-27 – 2020-02-29 (×3): 25 mg via ORAL
  Filled 2020-02-26 (×3): qty 1

## 2020-02-26 MED ORDER — SODIUM CHLORIDE 0.9% FLUSH
3.0000 mL | INTRAVENOUS | Status: DC | PRN
  Administered 2020-03-02: 3 mL via INTRAVENOUS

## 2020-02-26 MED ORDER — ONDANSETRON HCL 4 MG/2ML IJ SOLN
4.0000 mg | Freq: Four times a day (QID) | INTRAMUSCULAR | Status: DC | PRN
  Administered 2020-03-04: 4 mg via INTRAVENOUS
  Filled 2020-02-26: qty 2

## 2020-02-26 MED ORDER — FUROSEMIDE 10 MG/ML IJ SOLN
40.0000 mg | Freq: Once | INTRAMUSCULAR | Status: AC
  Administered 2020-02-26: 40 mg via INTRAVENOUS
  Filled 2020-02-26: qty 4

## 2020-02-26 MED ORDER — POTASSIUM CHLORIDE CRYS ER 20 MEQ PO TBCR
60.0000 meq | EXTENDED_RELEASE_TABLET | Freq: Once | ORAL | Status: AC
  Administered 2020-02-26: 60 meq via ORAL
  Filled 2020-02-26: qty 3

## 2020-02-26 NOTE — ED Notes (Signed)
IV access attempted by multiple RN's without success.

## 2020-02-26 NOTE — ED Triage Notes (Signed)
Pt reports since Wednesday he has has productive cough with night sweats and shortness of breath. Pt has hx of CHF has not received any covid vaccines.

## 2020-02-26 NOTE — H&P (Signed)
History and Physical    Phillip Young GYI:[id number] DOB: 1986/01/26 DOA: 02/26/2020  PCP: Patient, No Pcp Per  Patient coming from: Home   Chief Complaint:  Chief Complaint  Patient presents with  . Cough  . Shortness of Breath     HPI:    34 year old male with past medical history of biventricular congestive heart failure (last document EF at [facility] of 20% 11/2019), morbid obesity, hidradenitis suppurativa obstructive sleep apnea, gastroesophageal reflux disease who presents to [facility] emergency department with complaints of shortness of breath.  Patient explains that on Saturday he began to develop shortness of breath, worse than his baseline.  Shortness of breath was initially mild in intensity of progressively worsened over the next several days and became severe in intensity.  Shortness of breath is worse with minimal exertion and improved with rest.  Patient complains of severe associated pillow orthopnea.  Patient denies any associated chest pain.  Patient denies any change in his chronic cough.  Patient denies fevers, sick contacts, recent travel or confirmed contact with COVID-19 infection.  Of note, patient has not had a COVID-19 vaccination.  Upon further questioning, it turns out the patient has been developing increasing abdominal girth and bilateral lower extremity edema over the past several weeks.  Patient was evaluated in the [facility] emergency department on 7/26 where he was evaluated and discharged home.  Patient symptoms have continued to worsen until he eventually presented to [facility] emergency department for evaluation.  Upon evaluation in the emergency department patient was clinically felt to be suffering from acute congestive heart failure and therefore was given 40 mg of IV Lasix.  Patient was also administered 60 mEq of oral potassium for hypokalemia.  The hospitalist group was then called to assess the patient for admission the  hospital.   Review of Systems:   Review of Systems  Respiratory: Positive for cough and shortness of breath.   Cardiovascular: Positive for leg swelling.  All other systems reviewed and are negative.     Past Medical History:  Diagnosis Date  . Biventricular congestive heart failure ([place])    Last Echo 11/2019 at [facility] reveals EF 20%  . Class 3 severe obesity due to excess calories with serious comorbidity and body mass index (BMI) of 50.0 to 59.9 in adult ([place]) 02/26/2020  . Essential hypertension 02/26/2020  . GERD without esophagitis 02/26/2020  . Hidradenitis suppurativa 02/26/2020  . OSA (obstructive sleep apnea) 02/26/2020  . Prediabetes 02/26/2020    History reviewed. No pertinent surgical history.   reports that he has quit smoking. He has quit using smokeless tobacco. No history on file for alcohol use and drug use.  No Known Allergies  Family History  Problem Relation Age of Onset  . Heart disease Mother   . Hypertension Mother   . Drug abuse Father      Prior to Admission medications   Medication Sig Start Date End Date Taking? Authorizing Provider  spironolactone (ALDACTONE) 25 MG tablet Take 12.5 mg by mouth daily. 01/16/20  Yes [provider]  torsemide (DEMADEX) 20 MG tablet Take 20 mg by mouth in the morning and at bedtime. 01/16/20  Yes [provider]  potassium chloride SA (KLOR-CON) 20 MEQ tablet Take 1 tablet (20 mEq total) by mouth daily. 02/26/20   Margarita Mail, PA-C    Physical Exam: Vitals:   02/26/20 1125 02/26/20 1341 02/26/20 2117 02/26/20 2242  BP: 134/79 103/76 117/81   Pulse:  99 97 (!) 104 (!) 101  Resp: (!) 24 16 (!) 34 (!) 26  Temp:      TempSrc:      SpO2: 93% 94% 91% 96%  Weight:      Height:        Constitutional: Acute alert and oriented x3, patient is in respiratory distress.  Patient is obese. Skin: no rashes, no lesions, good skin turgor noted. Eyes: Pupils are equally reactive to light.  No evidence of  scleral icterus or conjunctival pallor.  ENMT: Moist mucous membranes noted.  Posterior pharynx clear of any exudate or lesions.   Neck: normal, supple, no masses, no thyromegaly.  No evidence of jugular venous distension.   Respiratory: Patient exhibits fine bibasilar and mid field rales.  Patient does not exhibit any wheezing.  Patient is in respiratory distress and is tachypneic without any evidence of accessory muscle use  Cardiovascular: Tachycardic rate with regular rhythm, no murmurs / rubs / gallops.  Severe bilateral lower extremity pitting edema that tracks all the way up through the thighs.. 2+ pedal pulses. No carotid bruits.  Chest:   Nontender without crepitus or deformity.   Back:   Nontender without crepitus or deformity. Abdomen: Abdomen is extremely protuberant.  Abdomen is soft however.  Patient complains of generalized tenderness on palpation.  No evidence of intra-abdominal masses.  Positive bowel sounds noted in all quadrants.  Notable anterior abdominal wall pitting edema. Musculoskeletal: No joint deformity upper and lower extremities. Good ROM, no contractures. Normal muscle tone.  Neurologic: CN 2-12 grossly intact. Sensation intact, strength noted to be 5 out of 5 in all 4 extremities.  Patient is following all commands.  Patient is responsive to verbal stimuli.   Psychiatric: Patient presents as a normal mood with appropriate affect.  Patient seems to possess insight as to theircurrent situation.     Labs on Admission: I have personally reviewed following labs and imaging studies -   CBC: Recent Labs  Lab 02/26/20 1701  WBC 8.6  NEUTROABS 6.2  HGB 12.7*  HCT 40.8  MCV 91.5  PLT 099   Basic Metabolic Panel: Recent Labs  Lab 02/26/20 1701  NA 136  K 3.1*  CL 95*  CO2 29  GLUCOSE 102*  BUN 8  CREATININE 1.08  CALCIUM 9.0   GFR: Estimated Creatinine Clearance: 169.9 mL/min (by C-G formula based on SCr of 1.08 mg/dL). Liver Function Tests: No results  for input(s): AST, ALT, ALKPHOS, BILITOT, PROT, ALBUMIN in the last 168 hours. No results for input(s): LIPASE, AMYLASE in the last 168 hours. No results for input(s): AMMONIA in the last 168 hours. Coagulation Profile: No results for input(s): INR, PROTIME in the last 168 hours. Cardiac Enzymes: No results for input(s): CKTOTAL, CKMB, CKMBINDEX, TROPONINI in the last 168 hours. BNP (last 3 results) No results for input(s): PROBNP in the last 8760 hours. HbA1C: No results for input(s): HGBA1C in the last 72 hours. CBG: No results for input(s): GLUCAP in the last 168 hours. Lipid Profile: No results for input(s): CHOL, HDL, LDLCALC, TRIG, CHOLHDL, LDLDIRECT in the last 72 hours. Thyroid Function Tests: No results for input(s): TSH, T4TOTAL, FREET4, T3FREE, THYROIDAB in the last 72 hours. Anemia Panel: No results for input(s): VITAMINB12, FOLATE, FERRITIN, TIBC, IRON, RETICCTPCT in the last 72 hours. Urine analysis: No results found for: COLORURINE, APPEARANCEUR, LABSPEC, PHURINE, GLUCOSEU, HGBUR, BILIRUBINUR, KETONESUR, PROTEINUR, UROBILINOGEN, NITRITE, LEUKOCYTESUR  Radiological Exams on Admission - Personally Reviewed: DG Chest Portable 1 View  Result Date: 02/26/2020 CLINICAL DATA:  Cough and possible COVID EXAM: PORTABLE CHEST 1 VIEW COMPARISON:  02/18/2020 FINDINGS: Cardiopericardial enlargement. Diffuse hazy opacification of the chest which is likely vascular congestion and soft tissue attenuation. No visible effusion or pneumothorax IMPRESSION: Cardiomegaly and vascular congestion.  No focal pneumonia. Electronically Signed   By: Monte Fantasia M.D.   On: 02/26/2020 09:11    EKG: Personally reviewed.  Rhythm is sinus tachycardia with heart rate of 103 bpm.  Right axis deviation with Q waves in the anterior septal leads and evidence of biatrial enlargement.  No dynamic ST segment changes appreciated.  Assessment/Plan Principal Problem:   Acute on chronic systolic (congestive)  heart failure ([name])   Patient presenting with several week history of increasing abdominal girth and bilateral lower extremity pitting edema with rapidly progressive shortness of breath in the past several days concerning for acute on chronic systolic congestive heart failure.  Chest x-ray findings consistent with acute pulmonary edema.  Patient has known history of advanced congestive heart failure with last ejection fraction noted to be 20% in May.  Per last cardiology note at [facility], patient has yet to undergo cardiac catheterization but this is suspected to be nonischemic cardiomyopathy.  Placing patient on Lasix 60 mg IV every 12.  We will place patient on low-dose losartan. Patient reports that he has historically been on this medication however has been inconsistently taking some of his medications the past due to financial difficulties all of the patient is currently now working and has insurance.  Patient would benefit from reinitiation of beta-blocker therapy as well, will consider doing this once acute congestive heart failure is improved somewhat.  Placing patient temporarily on BiPAP for work of breathing as patient is in respiratory distress and quite tachypneic with pulmonary edema.  Strict input and output monitoring  Monitoring renal function electrolytes with serial chemistries  Daily weights  Admitting to stepdown unit  Active Problems:   Class 3 severe obesity due to excess calories with serious comorbidity and body mass index (BMI) of 50.0 to 59.9 in adult [facility])   Counseled patient daily on caloric restriction and regular physical activity.    Essential hypertension   As mentioned above, patient has historically been on beta-blocker therapy and losartan in the past but is not currently due to insurance difficulties.  Placing patient back on low-dose losartan, will likely also restart low-dose beta-blocker therapy as patient clinically improves.    GERD  without esophagitis   Daily proton pump inhibitor    Diuretic-induced hypokalemia   Provide patient with oral potassium chloride    OSA (obstructive sleep apnea)    Patient has history of obstructive sleep apnea and has last been seen at [place] [facility] in May.  Patient has yet to be prescribed with a new CPAP/BiPAP however   Code Status:  Full code Family Communication: Mother is at bedside and has been updated on plan of care.  Status is: Inpatient  Remains inpatient appropriate because:IV treatments appropriate due to intensity of illness or inability to take PO and Inpatient level of care appropriate due to severity of illness   Dispo: The patient is from: Home              Anticipated d/c is to: Home              Anticipated d/c date is: > 3 days              Patient  currently is not medically stable to d/c.        Vernelle Emerald MD [facility] Pager [phone]  If 7PM-7AM, please contact night-coverage [email or web address] Use universal [place] password for that web site. If you do not have the password, please call the hospital operator.  02/26/2020, 11:59 PM

## 2020-02-26 NOTE — Discharge Instructions (Signed)
Contact a health care provider if: You have questions about your medicines or you miss a dose. You feel anxious, depressed, or stressed. You have swelling in your feet, ankles, legs, or abdomen. You have shortness of breath during activity or exercise. You have a cough. You have a fever. You have trouble sleeping. You gain 2-3 lb (1-1.4 kg) in 24 hours or 5 lb (2.3 kg) in a week. Get help right away if: You have chest pain. You have shortness of breath while resting. You have severe fatigue. You are confused. You have severe dizziness. You have a rapid or irregular heartbeat. You have nausea or you vomit. You have a cough that is worse at night or you cannot lie flat. You have a cough that will not go away. You have severe depression or sadness.

## 2020-02-26 NOTE — ED Provider Notes (Signed)
[place] EMERGENCY DEPARTMENT Provider Note   CSN: [id number] Arrival date & time: 02/26/20  0825     History Chief Complaint  Patient presents with  . Cough  . Shortness of Breath    Phillip Young is a 34 y.o. male who presents with a cc of shortness of breath.  He has a past medical history of obesity, nicotine dependence, chronic systolic heart failure.  Over the past 3 days he has had worsening shortness of breath at rest and states that normally he gets winded when he is walking however now he can try to move or take off his shirt and he gets extremely short of breath.  He has increased his oral Lasix intake to 60 to 80 mg daily even though he is only prescribed 40 states that he has been urinating but it does not seem to reduce his edema in his legs and abdomen.  He has had chronic cough for over a year.  He denies fevers, chills.  HPI     No past medical history on file.  There are no problems to display for this patient.   No past surgical history on file.     No family history on file.  Social History   Tobacco Use  . Smoking status: Not on file  Substance Use Topics  . Alcohol use: Not on file  . Drug use: Not on file    Home Medications Prior to Admission medications   Not on File    Allergies    Patient has no known allergies.  Review of Systems   Review of Systems Ten systems reviewed and are negative for acute change, except as noted in the HPI.   Physical Exam Updated Vital Signs BP 103/76   Pulse 97   Temp 98.3 F (36.8 C) (Oral)   Resp 16   Ht 6' (1.829 m)   Wt (!) 195 kg   SpO2 94%   BMI 58.32 kg/m   Physical Exam Vitals and nursing note reviewed.  Constitutional:      General: He is not in acute distress.    Appearance: He is well-developed. He is not diaphoretic.  HENT:     Head: Normocephalic and atraumatic.  Eyes:     General: No scleral icterus.    Conjunctiva/sclera: Conjunctivae normal.   Cardiovascular:     Rate and Rhythm: Normal rate and regular rhythm.     Heart sounds: Normal heart sounds.     Comments: 1+ pitting abdominal edema up to the umbilicus Pulmonary:     Effort: Pulmonary effort is normal. No respiratory distress.     Breath sounds: Normal breath sounds.  Abdominal:     Palpations: Abdomen is soft.     Tenderness: There is no abdominal tenderness.  Musculoskeletal:     Cervical back: Normal range of motion and neck supple.     Right lower leg: 4+ Edema present.     Left lower leg: 4+ Edema present.  Skin:    General: Skin is warm and dry.  Neurological:     Mental Status: He is alert.  Psychiatric:        Behavior: Behavior normal.     ED Results / Procedures / Treatments   Labs (all labs ordered are listed, but only abnormal results are displayed) Labs Reviewed  SARS CORONAVIRUS 2 BY RT PCR (HOSPITAL ORDER, [facility])  BASIC METABOLIC PANEL  CBC WITH DIFFERENTIAL/PLATELET  BRAIN NATRIURETIC  PEPTIDE    EKG None  Radiology DG Chest Portable 1 View  Result Date: 02/26/2020 CLINICAL DATA:  Cough and possible COVID EXAM: PORTABLE CHEST 1 VIEW COMPARISON:  02/18/2020 FINDINGS: Cardiopericardial enlargement. Diffuse hazy opacification of the chest which is likely vascular congestion and soft tissue attenuation. No visible effusion or pneumothorax IMPRESSION: Cardiomegaly and vascular congestion.  No focal pneumonia. Electronically Signed   By: Monte Fantasia M.D.   On: 02/26/2020 09:11    Procedures Procedures (including critical care time)  Medications Ordered in ED Medications - No data to display  ED Course  I have reviewed the triage vital signs and the nursing notes.  Pertinent labs & imaging results that were available during my care of the patient were reviewed by me and considered in my medical decision making (see chart for details).  Clinical Course as of Feb 26 1446  Tue Feb 26, 2020  1920  Potassium(!): 3.1 [AH]    Clinical Course User Index [AH] Margarita Mail, PA-C   MDM Rules/Calculators/A&P                          CC:sob VS: BP (!) 125/99   Pulse 98   Temp 97.9 F (36.6 C) (Oral)   Resp 13   Ht 6' (1.829 m)   Wt (!) 206.3 kg Comment: Scale A  SpO2 100%   BMI 61.68 kg/m  QB:HALPFXT is gathered by patient and EMR/ mom at bedside. Previous records obtained and reviewed. DDX:The patient's complaint of sob involves an extensive number of diagnostic and treatment options, and is a complaint that carries with it a high risk of complications, morbidity, and potential mortality. Given the large differential diagnosis, medical decision making is of high complexity. The emergent differential diagnosis for shortness of breath includes, but is not limited to, Pulmonary edema, bronchoconstriction, Pneumonia, Pulmonary embolism, Pneumotherax/ Hemothorax, Dysrythmia, ACS.  Labs: I ordered reviewed and interpreted labs which include CBC which shows mild normocytic anemia, BNP elevated at 368.5 in a morbidly obese male. BMP with mild hypokalemia, elevated blood glucose.  Imaging: I ordered and reviewed images which included portable 1 view chest x-ray. I independently visualized and interpreted all imaging. Significant findings include cardiomegaly with pulmonary vascular congestion.  EKG: Sinus tachycardia at a rate of 103  Patient here with SOB- appears to be CHF exacerbation. Given IV lasix with 1 L urine OP. Plan to ambulate with pulse ox. Hypokalemia repleated orally Sign out given to Dr. Roxanne Mins.        Final Clinical Impression(s) / ED Diagnoses Final diagnoses:  None    Rx / DC Orders ED Discharge Orders    None       Margarita Mail, PA-C 02/40/97 3532    Delora Fuel, MD 99/24/26 8341    Delora Fuel, MD 96/22/29 1352

## 2020-02-27 ENCOUNTER — Other Ambulatory Visit: Payer: Self-pay

## 2020-02-27 ENCOUNTER — Inpatient Hospital Stay (HOSPITAL_COMMUNITY): Payer: BC Managed Care – PPO

## 2020-02-27 DIAGNOSIS — I340 Nonrheumatic mitral (valve) insufficiency: Secondary | ICD-10-CM

## 2020-02-27 DIAGNOSIS — I5023 Acute on chronic systolic (congestive) heart failure: Secondary | ICD-10-CM | POA: Diagnosis not present

## 2020-02-27 DIAGNOSIS — E876 Hypokalemia: Secondary | ICD-10-CM | POA: Diagnosis not present

## 2020-02-27 DIAGNOSIS — I371 Nonrheumatic pulmonary valve insufficiency: Secondary | ICD-10-CM

## 2020-02-27 DIAGNOSIS — I10 Essential (primary) hypertension: Secondary | ICD-10-CM | POA: Diagnosis not present

## 2020-02-27 LAB — CBC WITH DIFFERENTIAL/PLATELET
Abs Immature Granulocytes: 0.05 10*3/uL (ref 0.00–0.07)
Basophils Absolute: 0.1 10*3/uL (ref 0.0–0.1)
Basophils Relative: 1 %
Eosinophils Absolute: 0.3 10*3/uL (ref 0.0–0.5)
Eosinophils Relative: 3 %
HCT: 41.1 % (ref 39.0–52.0)
Hemoglobin: 13.1 g/dL (ref 13.0–17.0)
Immature Granulocytes: 1 %
Lymphocytes Relative: 15 %
Lymphs Abs: 1.6 10*3/uL (ref 0.7–4.0)
MCH: 28.9 pg (ref 26.0–34.0)
MCHC: 31.9 g/dL (ref 30.0–36.0)
MCV: 90.5 fL (ref 80.0–100.0)
Monocytes Absolute: 0.6 10*3/uL (ref 0.1–1.0)
Monocytes Relative: 5 %
Neutro Abs: 7.9 10*3/uL — ABNORMAL HIGH (ref 1.7–7.7)
Neutrophils Relative %: 75 %
Platelets: 337 10*3/uL (ref 150–400)
RBC: 4.54 MIL/uL (ref 4.22–5.81)
RDW: 15.9 % — ABNORMAL HIGH (ref 11.5–15.5)
WBC: 10.5 10*3/uL (ref 4.0–10.5)
nRBC: 0 % (ref 0.0–0.2)

## 2020-02-27 LAB — ECHOCARDIOGRAM COMPLETE
Area-P 1/2: 5.02 cm2
Height: 72 in
S' Lateral: 5.75 cm
Weight: 7276.94 oz

## 2020-02-27 LAB — BASIC METABOLIC PANEL
Anion gap: 14 (ref 5–15)
BUN: 8 mg/dL (ref 6–20)
CO2: 28 mmol/L (ref 22–32)
Calcium: 8.9 mg/dL (ref 8.9–10.3)
Chloride: 96 mmol/L — ABNORMAL LOW (ref 98–111)
Creatinine, Ser: 1.09 mg/dL (ref 0.61–1.24)
GFR calc Af Amer: >60 mL/min (ref >60)
GFR calc non Af Amer: >60 mL/min (ref >60)
Glucose, Bld: 144 mg/dL — ABNORMAL HIGH (ref 70–99)
Potassium: 3.2 mmol/L — ABNORMAL LOW (ref 3.5–5.1)
Sodium: 138 mmol/L (ref 135–145)

## 2020-02-27 LAB — MAGNESIUM
Magnesium: 1.6 mg/dL — ABNORMAL LOW (ref 1.7–2.4)
Magnesium: 1.7 mg/dL (ref 1.7–2.4)

## 2020-02-27 LAB — TROPONIN I (HIGH SENSITIVITY)
Troponin I (High Sensitivity): 22 ng/L — ABNORMAL HIGH (ref <18)
Troponin I (High Sensitivity): 21 ng/L — ABNORMAL HIGH (ref <18)

## 2020-02-27 LAB — HEMOGLOBIN A1C
Hgb A1c MFr Bld: 6.8 % — ABNORMAL HIGH (ref 4.8–5.6)
Mean Plasma Glucose: 148.46 mg/dL

## 2020-02-27 LAB — HIV ANTIBODY (ROUTINE TESTING W REFLEX): HIV Screen 4th Generation wRfx: NONREACTIVE

## 2020-02-27 LAB — MRSA PCR SCREENING: MRSA by PCR: NEGATIVE

## 2020-02-27 MED ORDER — METOPROLOL TARTRATE 12.5 MG HALF TABLET
12.5000 mg | ORAL_TABLET | Freq: Two times a day (BID) | ORAL | Status: DC
  Administered 2020-02-27 – 2020-03-02 (×9): 12.5 mg via ORAL
  Filled 2020-02-27 (×9): qty 1

## 2020-02-27 MED ORDER — HEPARIN SODIUM (PORCINE) 5000 UNIT/ML IJ SOLN
5000.0000 [IU] | Freq: Three times a day (TID) | INTRAMUSCULAR | Status: DC
  Administered 2020-02-28 – 2020-03-03 (×15): 5000 [IU] via SUBCUTANEOUS
  Filled 2020-02-27 (×16): qty 1

## 2020-02-27 MED ORDER — POTASSIUM CHLORIDE CRYS ER 20 MEQ PO TBCR
40.0000 meq | EXTENDED_RELEASE_TABLET | ORAL | Status: AC
  Administered 2020-02-27 (×2): 40 meq via ORAL
  Filled 2020-02-27 (×2): qty 2

## 2020-02-27 MED ORDER — MAGNESIUM SULFATE 2 GM/50ML IV SOLN
2.0000 g | Freq: Once | INTRAVENOUS | Status: AC
  Administered 2020-02-27: 2 g via INTRAVENOUS
  Filled 2020-02-27: qty 50

## 2020-02-27 MED ORDER — SPIRONOLACTONE 25 MG PO TABS
25.0000 mg | ORAL_TABLET | Freq: Every day | ORAL | Status: DC
  Administered 2020-02-27 – 2020-03-11 (×14): 25 mg via ORAL
  Filled 2020-02-27 (×14): qty 1

## 2020-02-27 MED ORDER — HEPARIN SODIUM (PORCINE) 5000 UNIT/ML IJ SOLN: 5000.0000 [IU] | Freq: Three times a day (TID) | INTRAMUSCULAR | Status: DC

## 2020-02-27 MED ORDER — PNEUMOCOCCAL VAC POLYVALENT 25 MCG/0.5ML IJ INJ: 0.5000 mL | INJECTION | INTRAMUSCULAR | Status: DC

## 2020-02-27 MED ORDER — GUAIFENESIN-DM 100-10 MG/5ML PO SYRP
5.0000 mL | ORAL_SOLUTION | ORAL | Status: DC | PRN
  Administered 2020-02-28 – 2020-03-03 (×7): 5 mL via ORAL
  Filled 2020-02-27 (×7): qty 5

## 2020-02-27 MED ORDER — PERFLUTREN LIPID MICROSPHERE
1.0000 mL | INTRAVENOUS | Status: AC | PRN
  Administered 2020-02-27: 2 mL via INTRAVENOUS
  Filled 2020-02-27: qty 10

## 2020-02-27 NOTE — Progress Notes (Signed)
  Echocardiogram 2D Echocardiogram with defintiy has been performed.  Phillip Young M 02/27/2020, 2:53 PM

## 2020-02-27 NOTE — Progress Notes (Signed)
PROGRESS NOTE    Phillip Young  ZJQ:[id number] DOB: 1986/03/19 DOA: 02/26/2020 PCP: Patient, No Pcp Per    Brief Narrative:  Patient was admitted to the hospital with a working diagnosis of acute on chronic systolic heart failure.  34 year old male with past medical history of biventricular congestive heart failure, ejection fraction 20%, obesity class III, obstructive sleep apnea, GERD and hidradenitis suppurativa who presents with dyspnea.  Patient reported 3 days of worsening dyspnea, associated with severe orthopnea GERD and lower extremity edema.  On his initial physical examination blood pressure 134/79, heart rate 104, respiratory rate thirty-four, oxygen saturation 91%.  His lungs had bibasilar Rales, no wheezing, heart S1-S2, present rhythmic, abdomen protuberant, positive lower extremity edema pitting bilaterally. Sodium 136, potassium 3.1, chloride 105, bicarb 29, glucose 102, BUN eight, creatinine 1.0, BNP 368, white count 8.6, hemoglobin 12.7, hematocrit 40.8, platelets 313.  SARS COVID-19 negative. Chest radiograph with positive cardiomegaly, diffuse bilateral interstitial infiltrates. EKG 103 bpm, rightward axis, normal intervals, sinus rhythm with left atrial enlargement, poor R wave progression, no significant ST segment or T wave changes.   Assessment & Plan:   Principal Problem:   Acute on chronic systolic (congestive) heart failure ([name]) Active Problems:   Class 3 severe obesity due to excess calories with serious comorbidity and body mass index (BMI) of 50.0 to 59.9 in adult [facility])   Essential hypertension   GERD without esophagitis   Diuretic-induced hypokalemia   OSA (obstructive sleep apnea)   1. Acute on chronic systolic heart failure, complicated with cardiogenic pulmonary edema and acute hypoxemic respiratory failure. E 20 to  25% 2019.  Blood pressure this am 103, oxygenation 100% on 3 L/ min per [place], urine output since admission 2000 ml.  Patient with  improvement of dyspnea but not yet back to baseline.   Continue aggressive diuresis with furosemide 60 mg IV q12, to target a negative fluid balance.  Continue with losartan and will resume spironolactone, will resume low dos metoprolol. In the past he has been on digoxin.   Old records personally reviewed: cardiology follow up at [facility] 03/21.  Non ischemic cardiomyopathy with biventricular failure, b blockade then held due to acute decompensation and hypotension. He is under consideration for ICD.   2. Hypokalemia/ hypomagnesemia.  Renal function with serum cr at 1,0, K is down to 3,2 and Mg is 1,7.  Will continue K correction with Kcl 40 meq x2 and add Mg sulfate. Continue diuresis with furosemide, follow up renal function in am.    3. HTN. Continue losartan, will resume low dose metoprolol and resume spironolactone  4. GERD. Continue antiacid therapy   5. Obesity class 3 with OSA. Continue cpap as tolerted    Patient continue to be at high risk for worsening heart failure   Status is: Inpatient  Remains inpatient appropriate because:IV treatments appropriate due to intensity of illness or inability to take PO   Dispo: The patient is from: Home              Anticipated d/c is to: Home              Anticipated d/c date is: 3 days              Patient currently is not medically stable to d/c.   DVT prophylaxis: Heparin   Code Status:   full  Family Communication:  No family at the bedside      Nutrition Status:    Subjective: Patient continue  to have dyspnea, improved but not yet back to baseline, no nausea or vomiting, no chest pain ,.   Objective: Vitals:   02/27/20 0451 02/27/20 0742 02/27/20 0917 02/27/20 1225  BP:  118/78  103/72  Pulse: (!) 101 (!) 103 94 92  Resp: (!) 29 20 (!) 22 13  Temp:  97.8 F (36.6 C)  97.9 F (36.6 C)  TempSrc:  Oral  Oral  SpO2: 100% 99% 99% 100%  Weight:      Height:        Intake/Output Summary (Last 24 hours) at  02/27/2020 1253 Last data filed at 02/27/2020 1200 Gross per 24 hour  Intake 1384 ml  Output 4385 ml  Net -3001 ml   Filed Weights   02/26/20 0841 02/27/20 0100  Weight: (!) 195 kg (!) 206.3 kg    Examination:   General: deconditioned  Neurology: Awake and alert, non focal  E ENT: mild pallor, no icterus, oral mucosa moist Cardiovascular: No JVD. S1-S2 present, rhythmic, no gallops, rubs, or murmurs. +++ pitting bilateral lower extremity edema. Pulmonary: positive breath sounds bilaterally, decreased breath sounds at bases. Gastrointestinal. Abdomen protuberant, non tender Skin. No rashes Musculoskeletal: no joint deformities     Data Reviewed: I have personally reviewed following labs and imaging studies  CBC: Recent Labs  Lab 02/26/20 1701 02/27/20 0134  WBC 8.6 10.5  NEUTROABS 6.2 7.9*  HGB 12.7* 13.1  HCT 40.8 41.1  MCV 91.5 90.5  PLT 313 993   Basic Metabolic Panel: Recent Labs  Lab 02/26/20 1701 02/27/20 0108 02/27/20 0134  NA 136  --  138  K 3.1*  --  3.2*  CL 95*  --  96*  CO2 29  --  28  GLUCOSE 102*  --  144*  BUN 8  --  8  CREATININE 1.08  --  1.09  CALCIUM 9.0  --  8.9  MG  --  1.6* 1.7   GFR: Estimated Creatinine Clearance: 174.4 mL/min (by C-G formula based on SCr of 1.09 mg/dL). Liver Function Tests: No results for input(s): AST, ALT, ALKPHOS, BILITOT, PROT, ALBUMIN in the last 168 hours. No results for input(s): LIPASE, AMYLASE in the last 168 hours. No results for input(s): AMMONIA in the last 168 hours. Coagulation Profile: No results for input(s): INR, PROTIME in the last 168 hours. Cardiac Enzymes: No results for input(s): CKTOTAL, CKMB, CKMBINDEX, TROPONINI in the last 168 hours. BNP (last 3 results) No results for input(s): PROBNP in the last 8760 hours. HbA1C: Recent Labs    02/27/20 0134  HGBA1C 6.8*   CBG: No results for input(s): GLUCAP in the last 168 hours. Lipid Profile: No results for input(s): CHOL, HDL, LDLCALC,  TRIG, CHOLHDL, LDLDIRECT in the last 72 hours. Thyroid Function Tests: No results for input(s): TSH, T4TOTAL, FREET4, T3FREE, THYROIDAB in the last 72 hours. Anemia Panel: No results for input(s): VITAMINB12, FOLATE, FERRITIN, TIBC, IRON, RETICCTPCT in the last 72 hours.    Radiology Studies: I have reviewed all of the imaging during this hospital visit personally     Scheduled Meds: . enoxaparin (LOVENOX) injection  100 mg Subcutaneous Daily  . furosemide  60 mg Intravenous BID  . losartan  25 mg Oral Daily  . pantoprazole  40 mg Oral Daily  . [START ON 02/28/2020] pneumococcal 23 valent vaccine  0.5 mL Intramuscular Tomorrow-1000  . potassium chloride  40 mEq Oral Q4H  . sodium chloride flush  3 mL Intravenous Q12H   Continuous Infusions: .  sodium chloride       LOS: 1 day        Dutch Ing Gerome Apley, MD

## 2020-02-27 NOTE — Plan of Care (Signed)
°  Problem: Education: Goal: Knowledge of General Education information will improve Description: Including pain rating scale, medication(s)/side effects and non-pharmacologic comfort measures Outcome: Progressing   Problem: Health Behavior/Discharge Planning: Goal: Ability to manage health-related needs will improve Outcome: Progressing   Problem: Clinical Measurements: Goal: Ability to maintain clinical measurements within normal limits will improve Outcome: Progressing   Problem: Clinical Measurements: Goal: Diagnostic test results will improve Outcome: Progressing   Problem: Clinical Measurements: Goal: Respiratory complications will improve Outcome: Progressing   Problem: Activity: Goal: Risk for activity intolerance will decrease Outcome: Progressing   Problem: Elimination: Goal: Will not experience complications related to urinary retention Outcome: Progressing   Problem: Pain Managment: Goal: General experience of comfort will improve Outcome: Progressing   Problem: Safety: Goal: Ability to remain free from injury will improve Outcome: Progressing

## 2020-02-28 DIAGNOSIS — I5023 Acute on chronic systolic (congestive) heart failure: Secondary | ICD-10-CM | POA: Diagnosis not present

## 2020-02-28 DIAGNOSIS — E876 Hypokalemia: Secondary | ICD-10-CM | POA: Diagnosis not present

## 2020-02-28 DIAGNOSIS — I10 Essential (primary) hypertension: Secondary | ICD-10-CM | POA: Diagnosis not present

## 2020-02-28 LAB — BASIC METABOLIC PANEL
Anion gap: 10 (ref 5–15)
BUN: 11 mg/dL (ref 6–20)
CO2: 27 mmol/L (ref 22–32)
Calcium: 9 mg/dL (ref 8.9–10.3)
Chloride: 100 mmol/L (ref 98–111)
Creatinine, Ser: 1.17 mg/dL (ref 0.61–1.24)
GFR calc Af Amer: >60 mL/min (ref >60)
GFR calc non Af Amer: >60 mL/min (ref >60)
Glucose, Bld: 137 mg/dL — ABNORMAL HIGH (ref 70–99)
Potassium: 4.1 mmol/L (ref 3.5–5.1)
Sodium: 137 mmol/L (ref 135–145)

## 2020-02-28 NOTE — Plan of Care (Signed)

## 2020-02-28 NOTE — Progress Notes (Signed)
PROGRESS NOTE    Phillip Young  XNA:[id number] DOB: [birth year] DOA: 02/26/2020 PCP: Patient, No Pcp Per    Brief Narrative:  Patient was admitted to the hospital with a working diagnosis of acute on chronic systolic heart failure.  34 year old male with past medical history of biventricular congestive heart failure, ejection fraction 20%, obesity class III, obstructive sleep apnea, GERD and hidradenitis suppurativa who presents with dyspnea.  Patient reported 3 days of worsening dyspnea, associated with severe orthopnea GERD and lower extremity edema.  On his initial physical examination blood pressure 134/79, heart rate 104, respiratory rate thirty-four, oxygen saturation 91%.  His lungs had bibasilar Rales, no wheezing, heart S1-S2, present rhythmic, abdomen protuberant, positive lower extremity edema pitting bilaterally. Sodium 136, potassium 3.1, chloride 105, bicarb 29, glucose 102, BUN eight, creatinine 1.0, BNP 368, white count 8.6, hemoglobin 12.7, hematocrit 40.8, platelets 313.  SARS COVID-19 negative. Chest radiograph with positive cardiomegaly, diffuse bilateral interstitial infiltrates. EKG 103 bpm, rightward axis, normal intervals, sinus rhythm with left atrial enlargement, poor R wave progression, no significant ST segment or T wave changes.  Old records personally reviewed: cardiology follow up at [facility] 03/21.  Non ischemic cardiomyopathy with biventricular failure, b blockade then held due to acute decompensation and hypotension. He is under consideration for ICD.   Patient placed on aggressive diuresis with furosemide with good response.   Assessment & Plan:   Principal Problem:   Acute on chronic systolic (congestive) heart failure ([name]) Active Problems:   Class 3 severe obesity due to excess calories with serious comorbidity and body mass index (BMI) of 50.0 to 59.9 in adult [facility])   Essential hypertension   GERD without esophagitis   Diuretic-induced  hypokalemia   OSA (obstructive sleep apnea)   1. Acute on chronic systolic heart failure, complicated with cardiogenic pulmonary edema and acute hypoxemic respiratory failure. E 20 to  25% 2019.  His blood pressure has remain stable with systolic 542 mmHG, urine output over last 24 H is 2,336 ml, with continue to improve dyspnea.   On IV diuresis with furosemide 60 mg q12 and spironolactone. Heart failure regimen with losartan and metoprolol.   Follow up echocardiography with EF less than 20%, with global hypokinesis, severe reduction in right ventricle systolic function, with severe dilatation.   2. Hypokalemia/ hypomagnesemia.  Tolerating well diuresis, clinically continue to be volume overloaded.  Renal function with serum cr at 1,17 with K at 4,1 and serum bicarbonate at 27.  Continue loop diuretic and spironolactone diuresis. Target a negative fluid balance.   3. HTN. Stable blood pressure tolerating well diuresis,   4. GERD. Continue with pantoprazole.    5. Obesity class 3 with OSA. On qhs cpap as tolerted   Patient continue to be at high risk for worsening heart failure.   Status is: Inpatient  Remains inpatient appropriate because:IV treatments appropriate due to intensity of illness or inability to take PO Patient was not able to follow up as outpatient due to high co-pay.   Dispo: The patient is from: Home              Anticipated d/c is to: Home              Anticipated d/c date is: 3 days              Patient currently is not medically stable to d/c.   DVT prophylaxis: Heparin   Code Status:   full  Family Communication:  I left  a voice message to his mother, unable to reach her at this point.  Subjective: Patient with improved dyspnea but not yet back to baseline continue to have edema and cough.   Objective: Vitals:   02/27/20 2344 02/28/20 0000 02/28/20 0320 02/28/20 0811  BP: 106/64 116/78 104/65 114/70  Pulse: 93 82 (!) 106 93  Resp: (!) 26 (!) 21  18 (!) 24  Temp: 97.9 F (36.6 C)  98.2 F (36.8 C) 97.6 F (36.4 C)  TempSrc: Oral  Oral Oral  SpO2: 92% 99% 93% 93%  Weight:   (!) 207.1 kg   Height:        Intake/Output Summary (Last 24 hours) at 02/28/2020 0955 Last data filed at 02/28/2020 0813 Gross per 24 hour  Intake 2213 ml  Output 2260 ml  Net -47 ml   Filed Weights   02/26/20 0841 02/27/20 0100 02/28/20 0320  Weight: (!) 195 kg (!) 206.3 kg (!) 207.1 kg    Examination:   General: deconditioned  Neurology: Awake and alert, non focal  E ENT: no pallor, no icterus, oral mucosa moist Cardiovascular: No JVD (wide short neck). S1-S2 present, rhythmic, no gallops, rubs, or murmurs. +++ pitting bilateral lower extremity edema. Pulmonary: decreased breath sounds bilaterally, at bases, but no wheezing, rales or rhonchi Gastrointestinal. Abdomen soft and non tender, protuberant Skin. No rashes Musculoskeletal: no joint deformities     Data Reviewed: I have personally reviewed following labs and imaging studies  CBC: Recent Labs  Lab 02/26/20 1701 02/27/20 0134  WBC 8.6 10.5  NEUTROABS 6.2 7.9*  HGB 12.7* 13.1  HCT 40.8 41.1  MCV 91.5 90.5  PLT 313 379   Basic Metabolic Panel: Recent Labs  Lab 02/26/20 1701 02/27/20 0108 02/27/20 0134 02/28/20 0351  NA 136  --  138 137  K 3.1*  --  3.2* 4.1  CL 95*  --  96* 100  CO2 29  --  28 27  GLUCOSE 102*  --  144* 137*  BUN 8  --  8 11  CREATININE 1.08  --  1.09 1.17  CALCIUM 9.0  --  8.9 9.0  MG  --  1.6* 1.7  --    GFR: Estimated Creatinine Clearance: 162.8 mL/min (by C-G formula based on SCr of 1.17 mg/dL). Liver Function Tests: No results for input(s): AST, ALT, ALKPHOS, BILITOT, PROT, ALBUMIN in the last 168 hours. No results for input(s): LIPASE, AMYLASE in the last 168 hours. No results for input(s): AMMONIA in the last 168 hours. Coagulation Profile: No results for input(s): INR, PROTIME in the last 168 hours. Cardiac Enzymes: No results for  input(s): CKTOTAL, CKMB, CKMBINDEX, TROPONINI in the last 168 hours. BNP (last 3 results) No results for input(s): PROBNP in the last 8760 hours. HbA1C: Recent Labs    02/27/20 0134  HGBA1C 6.8*   CBG: No results for input(s): GLUCAP in the last 168 hours. Lipid Profile: No results for input(s): CHOL, HDL, LDLCALC, TRIG, CHOLHDL, LDLDIRECT in the last 72 hours. Thyroid Function Tests: No results for input(s): TSH, T4TOTAL, FREET4, T3FREE, THYROIDAB in the last 72 hours. Anemia Panel: No results for input(s): VITAMINB12, FOLATE, FERRITIN, TIBC, IRON, RETICCTPCT in the last 72 hours.    Radiology Studies: I have reviewed all of the imaging during this hospital visit personally     Scheduled Meds: . furosemide  60 mg Intravenous BID  . heparin injection (subcutaneous)  5,000 Units Subcutaneous Q8H  . losartan  25 mg Oral Daily  .  metoprolol tartrate  12.5 mg Oral BID  . pantoprazole  40 mg Oral Daily  . pneumococcal 23 valent vaccine  0.5 mL Intramuscular Tomorrow-1000  . sodium chloride flush  3 mL Intravenous Q12H  . spironolactone  25 mg Oral Daily   Continuous Infusions: . sodium chloride       LOS: 2 days        Bethenny Losee Gerome Apley, MD

## 2020-02-29 ENCOUNTER — Encounter (HOSPITAL_COMMUNITY): Payer: Self-pay | Admitting: Internal Medicine

## 2020-02-29 DIAGNOSIS — R053 Chronic cough: Secondary | ICD-10-CM

## 2020-02-29 DIAGNOSIS — Z6841 Body Mass Index (BMI) 40.0 and over, adult: Secondary | ICD-10-CM

## 2020-02-29 DIAGNOSIS — I5023 Acute on chronic systolic (congestive) heart failure: Secondary | ICD-10-CM | POA: Diagnosis not present

## 2020-02-29 DIAGNOSIS — I5082 Biventricular heart failure: Secondary | ICD-10-CM

## 2020-02-29 DIAGNOSIS — R05 Cough: Secondary | ICD-10-CM

## 2020-02-29 DIAGNOSIS — I509 Heart failure, unspecified: Secondary | ICD-10-CM

## 2020-02-29 DIAGNOSIS — I5043 Acute on chronic combined systolic (congestive) and diastolic (congestive) heart failure: Secondary | ICD-10-CM

## 2020-02-29 LAB — BASIC METABOLIC PANEL
Anion gap: 12 (ref 5–15)
BUN: 10 mg/dL (ref 6–20)
CO2: 26 mmol/L (ref 22–32)
Calcium: 9 mg/dL (ref 8.9–10.3)
Chloride: 97 mmol/L — ABNORMAL LOW (ref 98–111)
Creatinine, Ser: 1.08 mg/dL (ref 0.61–1.24)
GFR calc Af Amer: >60 mL/min (ref >60)
GFR calc non Af Amer: >60 mL/min (ref >60)
Glucose, Bld: 136 mg/dL — ABNORMAL HIGH (ref 70–99)
Potassium: 4 mmol/L (ref 3.5–5.1)
Sodium: 135 mmol/L (ref 135–145)

## 2020-02-29 MED ORDER — BENZONATATE 100 MG PO CAPS
200.0000 mg | ORAL_CAPSULE | Freq: Three times a day (TID) | ORAL | Status: DC | PRN
  Administered 2020-02-29 – 2020-03-03 (×4): 200 mg via ORAL
  Filled 2020-02-29 (×4): qty 2

## 2020-02-29 MED ORDER — DIGOXIN 125 MCG PO TABS
0.1250 mg | ORAL_TABLET | Freq: Every day | ORAL | Status: DC
  Administered 2020-02-29 – 2020-03-11 (×12): 0.125 mg via ORAL
  Filled 2020-02-29 (×12): qty 1

## 2020-02-29 MED ORDER — SACUBITRIL-VALSARTAN 24-26 MG PO TABS
1.0000 | ORAL_TABLET | Freq: Two times a day (BID) | ORAL | Status: DC
  Administered 2020-03-01 – 2020-03-04 (×7): 1 via ORAL
  Filled 2020-02-29 (×7): qty 1

## 2020-02-29 NOTE — Progress Notes (Signed)
PROGRESS NOTE    Phillip Young  WJX:[id number] DOB: 1986-02-24 DOA: 02/26/2020 PCP: Patient, No Pcp Per    Brief Narrative:  Patient was admitted to the hospital with a working diagnosis of acute on chronic systolic heart failure.  34 year old male with past medical history of biventricular congestive heart failure, ejection fraction 20%, obesity class III, obstructive sleep apnea, GERD and hidradenitis suppurativa who presents with dyspnea.  Patient reported 3 days of worsening dyspnea, associated with severe orthopnea GERD and lower extremity edema.  On his initial physical examination blood pressure 134/79, heart rate 104, respiratory rate thirty-four, oxygen saturation 91%.  His lungs had bibasilar Rales, no wheezing, heart S1-S2, present rhythmic, abdomen protuberant, positive lower extremity edema pitting bilaterally. Sodium 136, potassium 3.1, chloride 105, bicarb 29, glucose 102, BUN eight, creatinine 1.0, BNP 368, white count 8.6, hemoglobin 12.7, hematocrit 40.8, platelets 313.  SARS COVID-19 negative. Chest radiograph with positive cardiomegaly, diffuse bilateral interstitial infiltrates. EKG 103 bpm, rightward axis, normal intervals, sinus rhythm with left atrial enlargement, poor R wave progression, no significant ST segment or T wave changes.  Old records personally reviewed: cardiology follow up at [facility] 03/21.  Non ischemic cardiomyopathy with biventricular failure, b blockade then held due to acute decompensation and hypotension. He is under consideration for ICD.   Patient placed on aggressive diuresis with furosemide with good response.  February 29, 2020: Patient seen. Available records reviewed. Patient was admitted with acute on chronic biventricular heart failure. Patient is deemed to have nonischemic cardiomyopathy, but has never undergone ischemic work-up. Patient is morbidly obese, and likely has undiagnosed OSA/OHS. Patient is currently on IV diuretics. Patient is  slowly improving. Will consult cardiology team to assist with patient's management. Patient reports cough that may be secondary to postnasal drip syndrome.  Assessment & Plan:   Principal Problem:   Acute on chronic systolic (congestive) heart failure ([name]) Active Problems:   Morbid obesity with body mass index (BMI) of 60.0 to 69.9 in adult [facility])   Essential hypertension   GERD without esophagitis   Diuretic-induced hypokalemia   OSA (obstructive sleep apnea)   Biventricular heart failure ([name])   Chronic cough   1. Acute on chronic biventricular heart failure, complicated with cardiogenic pulmonary edema and acute hypoxemic respiratory failure. E 20 to  25% 2019.  His blood pressure has remain stable with systolic 213 mmHG, urine output over last 24 H is 2,336 ml, with continue to improve dyspnea.   On IV diuresis with furosemide 60 mg q12 and spironolactone. Heart failure regimen with losartan and metoprolol.   Follow up echocardiography with EF less than 20%, with global hypokinesis, severe reduction in right ventricle systolic function, with severe dilatation.  February 29, 2020: Continue IV Lasix. Consult cardiology team. Patient may need ischemic work-up. Patient will need to complete work-up for likely obstructive sleep apnea. Patient remains morbidly obese.  2. Hypokalemia/ hypomagnesemia.  Tolerating well diuresis, clinically continue to be volume overloaded.  Renal function with serum cr at 1,17 with K at 4,1 and serum bicarbonate at 27.  Continue loop diuretic and spironolactone diuresis. Target a negative fluid balance.  February 29, 2020: Potassium today is 4. Renal function is normal. Monitor renal function and electrolytes.  3. HTN. Stable blood pressure tolerating well diuresis,   4. GERD. Continue with pantoprazole.    5. Obesity class 3 with OSA. On qhs cpap as tolerted  Status is: Inpatient  Remains inpatient appropriate because:IV treatments appropriate due  to intensity of  illness or inability to take PO Patient was not able to follow up as outpatient due to high co-pay.   Dispo: The patient is from: Home              Anticipated d/c is to: Home              Anticipated d/c date is: 3 days              Patient currently is not medically stable to d/c.   DVT prophylaxis: Heparin   Code Status:   full  Family Communication:  I  Subjective: -Patient continues to report shortness of breath. -Patient reports having cough.   Objective: Vitals:   02/29/20 1214 02/29/20 1607 02/29/20 1845 02/29/20 1846  BP:  124/67    Pulse: 92 97 99   Resp: 19 19    Temp: 98 F (36.7 C) (!) 97.4 F (36.3 C)    TempSrc: Oral Oral    SpO2: 97% 99%  100%  Weight:      Height:        Intake/Output Summary (Last 24 hours) at 02/29/2020 1932 Last data filed at 02/29/2020 1600 Gross per 24 hour  Intake 1569 ml  Output 1950 ml  Net -381 ml   Filed Weights   02/27/20 0100 02/28/20 0320 02/29/20 0346  Weight: (!) 206.3 kg (!) 207.1 kg (!) 207.8 kg    Examination:   General: Morbidly obese. Not in any significant distress.   Neurology: Awake and alert, non focal. Patient moves all extremities. HEENT: no pallor, no jaundice. JVD is difficult to assess. Cardiovascular: S1-S2. Pulmonary: Decreased air entry globally. Gastrointestinal. Abdomen morbidly obese, soft and non tender. Organs are difficult to assess. Extremities: Bilateral leg edema.   Data Reviewed: I have personally reviewed following labs and imaging studies  CBC: Recent Labs  Lab 02/26/20 1701 02/27/20 0134  WBC 8.6 10.5  NEUTROABS 6.2 7.9*  HGB 12.7* 13.1  HCT 40.8 41.1  MCV 91.5 90.5  PLT 313 803   Basic Metabolic Panel: Recent Labs  Lab 02/26/20 1701 02/27/20 0108 02/27/20 0134 02/28/20 0351 02/29/20 0225  NA 136  --  138 137 135  K 3.1*  --  3.2* 4.1 4.0  CL 95*  --  96* 100 97*  CO2 29  --  28 27 26   GLUCOSE 102*  --  144* 137* 136*  BUN 8  --  8 11 10    CREATININE 1.08  --  1.09 1.17 1.08  CALCIUM 9.0  --  8.9 9.0 9.0  MG  --  1.6* 1.7  --   --    GFR: Estimated Creatinine Clearance: 175.1 mL/min (by C-G formula based on SCr of 1.08 mg/dL). Liver Function Tests: No results for input(s): AST, ALT, ALKPHOS, BILITOT, PROT, ALBUMIN in the last 168 hours. No results for input(s): LIPASE, AMYLASE in the last 168 hours. No results for input(s): AMMONIA in the last 168 hours. Coagulation Profile: No results for input(s): INR, PROTIME in the last 168 hours. Cardiac Enzymes: No results for input(s): CKTOTAL, CKMB, CKMBINDEX, TROPONINI in the last 168 hours. BNP (last 3 results) No results for input(s): PROBNP in the last 8760 hours. HbA1C: Recent Labs    02/27/20 0134  HGBA1C 6.8*   CBG: No results for input(s): GLUCAP in the last 168 hours. Lipid Profile: No results for input(s): CHOL, HDL, LDLCALC, TRIG, CHOLHDL, LDLDIRECT in the last 72 hours. Thyroid Function Tests: No results for input(s): TSH, T4TOTAL,  FREET4, T3FREE, THYROIDAB in the last 72 hours. Anemia Panel: No results for input(s): VITAMINB12, FOLATE, FERRITIN, TIBC, IRON, RETICCTPCT in the last 72 hours.    Radiology Studies: I have reviewed all of the imaging during this hospital visit personally  Scheduled Meds: . digoxin  0.125 mg Oral Daily  . furosemide  60 mg Intravenous BID  . heparin injection (subcutaneous)  5,000 Units Subcutaneous Q8H  . metoprolol tartrate  12.5 mg Oral BID  . pantoprazole  40 mg Oral Daily  . pneumococcal 23 valent vaccine  0.5 mL Intramuscular Tomorrow-1000  . [START ON 03/01/2020] sacubitril-valsartan  1 tablet Oral BID  . sodium chloride flush  3 mL Intravenous Q12H  . spironolactone  25 mg Oral Daily   Continuous Infusions: . sodium chloride       LOS: 3 days   Greater than 35 minutes spent in direct patient care.  Bonnell Public, MD

## 2020-02-29 NOTE — Consult Note (Addendum)
Cardiology Consultation:   Patient ID: Phillip Young MRN: [id number]; DOB: [birth year]  Admit date: 02/26/2020 Date of Consult: 02/29/2020  Primary Care Provider: Patient, No Pcp Per [place] Cardiologist: [facility] - Dr. Rosanna Randy [facility] Electrophysiologist:  None    Patient Profile:   Phillip Young is a 34 y.o. male with a hx of past medical history of iron deficiency anemia, hypertension, DM 2, undiagnosed obstructive sleep apnea, former tobacco abuse, morbid obesity, and history of biventricular heart failure who is being seen today for the evaluation of acute on chronic biventricular CHF at the request of Dr. Marthenia Rolling.  History of Present Illness:   Phillip Young is a morbidly obese 34 year old male with past medical history of iron deficiency anemia, hypertension, DM 2, undiagnosed obstructive sleep apnea, former tobacco abuse, morbid obesity, and history of biventricular heart failure.  Patient was initially diagnosed with biventricular heart failure after he presented with worsening dyspnea in 2019.  He has not smoked since the diagnosis of heart failure.  He denies any history of alcohol abuse or illicit drug use.  His mother has a history of pulmonary hypertension, father died of heroin overdose.  Family previously observed him to have multiple apnea episodes during sleep, however he was never formally diagnosed with obstructive sleep apnea.  It was suspected he has nonischemic cardiomyopathy, however left and right heart cath was not performed previously due to hypervolemia and orthopnea.  He says he has been sleeping upright in a chair for the past several years due to inability to breathe and chest tightness laying flat.  Previous work-up showed positive inflammatory markers involve ANA, ESR and CRP, negative HIV, negative hepatitis panel, negative viral panel, negative ds-DNA antibody, negative SPEP, negative drug screen, negative sickle cell and negative celiac  disease.  TSH normal.  Previous echocardiogram performed at [facility] on 12/20/2019 showed EF 20%, severe left atrial dilatation, severe global hypokinesis, no significant change when compared to the previous echo on 05/12/2018.  More recently, due to worsening dyspnea, he went to the emergency room.  He was told to increase his torsemide from the previous 20 mg twice daily to 40 mg a.m. and 20 mg p.m.    For the past week, he has been noticing increasing dyspnea on exertion, orthopnea and PND.  He self increase his torsemide to 40 mg twice daily without improvement.  He eventually sought medical attention at [facility].  Initial BNP was 368.5.  Potassium 3.1.  COVID-19 test was negative.  Portable chest x-ray obtained on 02/26/2020 showed vascular congestion, no focal pneumonia.  His mother who came to the hospital at the same time was diagnosed with pneumonia.  They were initially concerned of COVID-19 symptoms, however fortunately neither one of them was diagnosed with COVID-19.  EKG on arrival was sinus tachycardia.  HIV screen was negative.  Hemoglobin A1c was 6.8 which placed the patient in the diabetic range, whereas previously he carries a diagnosis of prediabetes.  He was placed on 60 mg twice daily of IV Lasix.  So far he has been diuresed 4.3 L.  Repeat echocardiogram performed at [facility] on 02/27/2020 showed EF less than 20%, no evidence of intracardiac thrombus, severely reduced RV systolic function, severe dilatation of the left and right atrium, mild MR.  Cardiology has been consulted for management of acute on chronic biventricular heart failure.   Past Medical History:  Diagnosis Date   Biventricular congestive heart failure ([place])    Last  Echo 11/2019 at [facility] reveals EF 20%   Class 3 severe obesity due to excess calories with serious comorbidity and body mass index (BMI) of 50.0 to 59.9 in adult [facility]) 02/26/2020   Essential hypertension 02/26/2020   GERD without  esophagitis 02/26/2020   Hidradenitis suppurativa 02/26/2020   OSA (obstructive sleep apnea) 02/26/2020   Prediabetes 02/26/2020    History reviewed. No pertinent surgical history.   Home Medications:  Prior to Admission medications   Medication Sig Start Date End Date Taking? Authorizing Provider  spironolactone (ALDACTONE) 25 MG tablet Take 12.5 mg by mouth daily. 01/16/20  Yes [provider]  torsemide (DEMADEX) 20 MG tablet Take 20 mg by mouth in the morning and at bedtime. 01/16/20  Yes [provider]  potassium chloride SA (KLOR-CON) 20 MEQ tablet Take 1 tablet (20 mEq total) by mouth daily. 02/26/20   Margarita Mail, PA-C    Inpatient Medications: Scheduled Meds:  furosemide  60 mg Intravenous BID   heparin injection (subcutaneous)  5,000 Units Subcutaneous Q8H   losartan  25 mg Oral Daily   metoprolol tartrate  12.5 mg Oral BID   pantoprazole  40 mg Oral Daily   pneumococcal 23 valent vaccine  0.5 mL Intramuscular Tomorrow-1000   sodium chloride flush  3 mL Intravenous Q12H   spironolactone  25 mg Oral Daily   Continuous Infusions:  sodium chloride     PRN Meds: sodium chloride, acetaminophen, guaiFENesin-dextromethorphan, ondansetron (ZOFRAN) IV, polyethylene glycol, sodium chloride flush  Allergies:   No Known Allergies  Social History:   Social History   Socioeconomic History   Marital status: Unknown    Spouse name: Not on file   Number of children: Not on file   Years of education: Not on file   Highest education level: Not on file  Occupational History   Not on file  Tobacco Use   Smoking status: Former Smoker   Smokeless tobacco: Former Network engineer and Sexual Activity   Alcohol use: Not on file   Drug use: Not on file   Sexual activity: Not on file  Other Topics Concern   Not on file  Social History Narrative   Not on file   Social Determinants of Health   Financial Resource Strain:    Difficulty of  Paying Living Expenses:   Food Insecurity:    Worried About Charity fundraiser in the Last Year:    Arboriculturist in the Last Year:   Transportation Needs:    Film/video editor (Medical):    Lack of Transportation (Non-Medical):   Physical Activity:    Days of Exercise per Week:    Minutes of Exercise per Session:   Stress:    Feeling of Stress :   Social Connections:    Frequency of Communication with Friends and Family:    Frequency of Social Gatherings with Friends and Family:    Attends Religious Services:    Active Member of Clubs or Organizations:    Attends Music therapist:    Marital Status:   Intimate Partner Violence:    Fear of Current or Ex-Partner:    Emotionally Abused:    Physically Abused:    Sexually Abused:     Family History:    Family History  Problem Relation Age of Onset   Heart disease Mother    Hypertension Mother    Drug abuse Father      ROS:  Please see the  history of present illness.   All other ROS reviewed and negative.     Physical Exam/Data:   Vitals:   02/29/20 0800 02/29/20 0840 02/29/20 1214 02/29/20 1607  BP: (!) 114/50   124/67  Pulse: 95 97 92 97  Resp: '20 20 19 19  '$ Temp:  97.9 F (36.6 C) 98 F (36.7 C) (!) 97.4 F (36.3 C)  TempSrc:  Oral Oral Oral  SpO2:   97% 99%  Weight:      Height:        Intake/Output Summary (Last 24 hours) at 02/29/2020 1733 Last data filed at 02/29/2020 1600 Gross per 24 hour  Intake 2169 ml  Output 3250 ml  Net -1081 ml   Last 3 Weights 02/29/2020 02/28/2020 02/27/2020  Weight (lbs) 458 lb 1.9 oz 456 lb 9.2 oz 454 lb 12.9 oz  Weight (kg) 207.8 kg 207.1 kg 206.3 kg     Body mass index is 62.13 kg/m.  General:  Well nourished, well developed, in no acute distress HEENT: normal Lymph: no adenopathy Neck: no JVD Endocrine:  No thryomegaly Vascular: No carotid bruits; FA pulses 2+ bilaterally without bruits  Cardiac:  normal S1, S2; RRR; no murmur   Lungs:  clear to auscultation bilaterally, no wheezing, rhonchi or rales  Abd: soft, nontender, no hepatomegaly  Ext: 1-2+ edema Musculoskeletal:  No deformities, BUE and BLE strength normal and equal Skin: warm and dry  Neuro:  CNs 2-12 intact, no focal abnormalities noted Psych:  Normal affect   EKG:  The EKG was personally reviewed and demonstrates: Sinus tachycardia, no significant ST-T wave changes Telemetry:  Telemetry was personally reviewed and demonstrates: Sinus tachycardia, single run of 5 beats nonsustained VT.  Relevant CV Studies:  Echo 02/27/2020 1. No intracardiac thrombus seen with Definity contrast. Left ventricular  ejection fraction, by estimation, is <20%. The left ventricle has severely  decreased function. The left ventricle demonstrates global hypokinesis.  The left ventricular internal  cavity size was moderately to severely dilated. Left ventricular diastolic  parameters are consistent with Grade II diastolic dysfunction  (pseudonormalization). Elevated left atrial pressure.  2. Right ventricular systolic function is severely reduced. The right  ventricular size is severely enlarged. Tricuspid regurgitation signal is  inadequate for assessing PA pressure.  3. Left atrial size was severely dilated.  4. Right atrial size was severely dilated.  5. The mitral valve is normal in structure. Mild mitral valve  regurgitation.  6. The aortic valve is tricuspid. Aortic valve regurgitation is not  visualized. No aortic stenosis is present.  7. The inferior vena cava is dilated in size with <50% respiratory  variability, suggesting right atrial pressure of 15 mmHg.   Laboratory Data:  High Sensitivity Troponin:   Recent Labs  Lab 02/27/20 0108 02/27/20 0939  TROPONINIHS 22* 21*     Chemistry Recent Labs  Lab 02/27/20 0134 02/28/20 0351 02/29/20 0225  NA 138 137 135  K 3.2* 4.1 4.0  CL 96* 100 97*  CO2 '28 27 26  '$ GLUCOSE 144* 137* 136*  BUN '8 11  10  '$ CREATININE 1.09 1.17 1.08  CALCIUM 8.9 9.0 9.0  GFRNONAA >60 >60 >60  GFRAA >60 >60 >60  ANIONGAP '14 10 12    '$ No results for input(s): PROT, ALBUMIN, AST, ALT, ALKPHOS, BILITOT in the last 168 hours. Hematology Recent Labs  Lab 02/26/20 1701 02/27/20 0134  WBC 8.6 10.5  RBC 4.46 4.54  HGB 12.7* 13.1  HCT 40.8 41.1  MCV 91.5  90.5  MCH 28.5 28.9  MCHC 31.1 31.9  RDW 15.9* 15.9*  PLT 313 337   BNP Recent Labs  Lab 02/26/20 1730  BNP 368.5*    DDimer No results for input(s): DDIMER in the last 168 hours.   Radiology/Studies:  DG Chest Portable 1 View  Result Date: 02/26/2020 CLINICAL DATA:  Cough and possible COVID EXAM: PORTABLE CHEST 1 VIEW COMPARISON:  02/18/2020 FINDINGS: Cardiopericardial enlargement. Diffuse hazy opacification of the chest which is likely vascular congestion and soft tissue attenuation. No visible effusion or pneumothorax IMPRESSION: Cardiomegaly and vascular congestion.  No focal pneumonia. Electronically Signed   By: Monte Fantasia M.D.   On: 02/26/2020 09:11   ECHOCARDIOGRAM COMPLETE  Result Date: 02/27/2020    ECHOCARDIOGRAM REPORT   Patient Name:   Phillip Young Date of Exam: 02/27/2020 Medical Rec #:  [id number]      Height:       72.0 in Accession #:    [id number]     Weight:       454.8 lb Date of Birth:  Dec 24, 1985       BSA:          3.021 m Patient Age:    33 years       BP:           125/99 mmHg Patient Gender: M              HR:           98 bpm. Exam Location:  Inpatient Procedure: 2D Echo and Intracardiac Opacification Agent Indications:    Dyspnea 786.09 / R06.00  History:        Patient has prior history of Echocardiogram examinations, most                 recent 05/12/2018. Risk Factors:Sleep Apnea and Former Smoker.                 Biventricular systolic heart failure, Biventricular systolic                 heart failure:.  Sonographer:    Darlina Sicilian RDCS Referring Phys: [id number] [place]  Sonographer Comments: Technically  difficult study due to poor echo windows. IMPRESSIONS  1. No intracardiac thrombus seen with Definity contrast. Left ventricular ejection fraction, by estimation, is <20%. The left ventricle has severely decreased function. The left ventricle demonstrates global hypokinesis. The left ventricular internal cavity size was moderately to severely dilated. Left ventricular diastolic parameters are consistent with Grade II diastolic dysfunction (pseudonormalization). Elevated left atrial pressure.  2. Right ventricular systolic function is severely reduced. The right ventricular size is severely enlarged. Tricuspid regurgitation signal is inadequate for assessing PA pressure.  3. Left atrial size was severely dilated.  4. Right atrial size was severely dilated.  5. The mitral valve is normal in structure. Mild mitral valve regurgitation.  6. The aortic valve is tricuspid. Aortic valve regurgitation is not visualized. No aortic stenosis is present.  7. The inferior vena cava is dilated in size with <50% respiratory variability, suggesting right atrial pressure of 15 mmHg. FINDINGS  Left Ventricle: No intracardiac thrombus seen with Definity contrast. Left ventricular ejection fraction, by estimation, is <20%. The left ventricle has severely decreased function. The left ventricle demonstrates global hypokinesis. Definity contrast agent was given IV to delineate the left ventricular endocardial borders. The left ventricular internal cavity size was moderately to severely dilated. There is no left ventricular hypertrophy. Left ventricular diastolic  parameters are consistent with Grade II diastolic dysfunction (pseudonormalization). Elevated left atrial pressure. Right Ventricle: The right ventricular size is severely enlarged. No increase in right ventricular wall thickness. Right ventricular systolic function is severely reduced. Tricuspid regurgitation signal is inadequate for assessing PA pressure. Left Atrium: Left atrial  size was severely dilated. Right Atrium: Right atrial size was severely dilated. Pericardium: There is no evidence of pericardial effusion. Mitral Valve: The mitral valve is normal in structure. Mild mitral valve regurgitation, with centrally-directed jet. Tricuspid Valve: The tricuspid valve is grossly normal. Tricuspid valve regurgitation is not demonstrated. Aortic Valve: The aortic valve is tricuspid. Aortic valve regurgitation is not visualized. No aortic stenosis is present. Pulmonic Valve: The pulmonic valve was normal in structure. Pulmonic valve regurgitation is mild. Aorta: The aortic root and ascending aorta are structurally normal, with no evidence of dilitation. Venous: The inferior vena cava is dilated in size with less than 50% respiratory variability, suggesting right atrial pressure of 15 mmHg. IAS/Shunts: The interatrial septum was not well visualized.  LEFT VENTRICLE PLAX 2D LVIDd:         6.65 cm LVIDs:         5.75 cm LV PW:         1.10 cm LV IVS:        1.00 cm LVOT diam:     1.70 cm LV SV:         18 LV SV Index:   6 LVOT Area:     2.27 cm  LEFT ATRIUM           Index LA diam:      4.90 cm 1.62 cm/m LA Vol (A4C): 84.5 ml 27.97 ml/m  AORTIC VALVE LVOT Vmax:   54.10 cm/s LVOT Vmean:  42.300 cm/s LVOT VTI:    0.080 m  AORTA Ao Root diam: 2.50 cm MITRAL VALVE MV Area (PHT): 5.02 cm    SHUNTS MV Decel Time: 151 msec    Systemic VTI:  0.08 m MV E velocity: 68.90 cm/s  Systemic Diam: 1.70 cm Dani Gobble Croitoru MD Electronically signed by Sanda Klein MD Signature Date/Time: 02/27/2020/3:46:27 PM    Final      New York Heart Association (NYHA) Functional Class NYHA Class IV  Assessment and Plan:   1. Acute on chronic biventricular heart failure  -Patient was initially on 20 mg twice daily of torsemide during the previous office visit in March with his primary cardiologist at [facility].  Since then, torsemide has been increased to 40 mg a.m. and 20 mg p.m. during the recent ED visit.  Due  to worsening shortness of breath, orthopnea and PND symptom, he eventually increased torsemide to 40 mg twice daily without significant improvement.  -Previous echocardiogram obtained at [facility] showed EF 20% with severe LV and RV dysfunction.  Repeat echocardiogram obtained on 02/26/2020 continue to show EF less than 20% with LV and RV dysfunction.  -Even though patient was diagnosed with acute heart failure since 2019, he never underwent a left and right heart cath due to volume overload and orthopnea  -continue IV lasix '60mg'$  BID, switch losartan to Entresto 24-'26mg'$  BID. Start on low dose digoxin  -Potentially consider left and right heart cath on Monday once he is adequately diuresed.  While the patient is here, also consider sleep study and CPAP titration  -Agree with previous assessment this is likely nonischemic cardiomyopathy given his young age.  Cardiomyopathy likely due to combination of issues include morbid obesity, hypertension,  undiagnosed obstructive sleep apnea.  Previous work-up showed positive inflammatory markers involve ANA, ESR and CRP, however negative viral panel, negative hepatitis panel, negative dsDNA antibody, negative SPEP, negative drug screen, negative sickle cell and a negative celiac disease.  -Patient is fairly compliant with medications.  He avoid salty food.  Unfortunately he frequently drinks more than 2 L of fluid due to thirst after taking diuretic pill, this will need to be watched carefully.  We also discussed the importance of seek medical attention if weight increased by more than 3 pounds overnight or 5 pounds in a single week.  He need close outpatient follow-up with his cardiologist.  During this admission, consider having him seen by our heart failure physician.  2. Hypertension: On spironolactone at home.  Continue spironolactone.  Consider transitioning his losartan to [facility].  I will leave the patient on low-dose metoprolol to avoid suppressing  catecholamine release in this patient with acute decompensated heart failure.  3. DM 2: Although he previously carried a diagnosis of prediabetes, hemoglobin A1c obtained during this admission was 6.8 which placed the patient in the diabetic range.  4. Morbid obesity: Weight loss imperative  5. Undiagnosed obstructive sleep apnea: CPAP titration while in the hospital  6. Former tobacco abuse: Quit in 2019  7. History of iron deficiency anemia: Resolved, hemoglobin normal.  8. Hypokalemia: Potassium normalized.      For questions or updates, please contact [place] Please consult [email or web address] for contact info under    Hilbert Corrigan, [place]  02/29/2020 5:33 PM   Patient seen and examined. Agree with assessment and plan.  Phillip Young is a 34 year old African-American male who has a history of super morbid obesity, hypertension, type 2 diabetes mellitus, obstructive sleep apnea, iron deficiency anemia, as well as biventricular heart failure.  He states since January 2019 he has noticed progressive increasing shortness of breath.  His weight at that time was in the mid to upper 300s.  Since June 2019 his weight has increased over 400 pounds.  He has been evaluated in the [facility] system.  He is been demonstrated to have an EF of 20% with severe four-chamber enlargement and severe global hypokinesis.  He had been on furosemide and ultimately this was switched to torsemide and he self increased his dose to 40 mg twice a day due to progressive shortness of breath.  He has had a chronic cough.  He was supposed to be evaluated for sleep apnea but this was canceled secondary to Covid.  He admits to snoring.  He has difficulty sleeping on his back.  He was admitted with increasing shortness of breath.  A chest x-ray showed vascular congestion without focal pneumonia.  Repeat echo Doppler study on August 4 showed EF less than 20% with severe biventricular failure.  On exam, he is morbidly  obese with a body mass index greater than 60.  There is no apparent JVD but he has very increased neck size.  Mallampati scale is a 4.  There are no rales.  Rhythm is regular with rate increase in the upper 90s.  There is a 1/6 systolic murmur.  No rub.  There is significant central adiposity.  Bowel sounds positive.  1-2+ bilateral lower extremity edema.  Laboratory is notable for minimally increased high-sensitivity troponin with flat plateau at 22 and 21. Potassium 4.0.  BUN 10 creatinine 1.08.  Hemoglobin 13.1/hematocrit 41.1.  BNP 368. While in the hospital he has been placed on furosemide but he  believes he had better diuresis with torsemide.  He also has been started on BiPAP 14/6 but states his cough is making it difficult for him to use throughout the night.  He does have a history of reflux.  At present, recommend initiation of low-dose digoxin at 0.125 mg.  Will change losartan to Entresto 24/26 mg twice daily.  Continue spironolactone at current dose.  Ultimately, consider transition from metoprolol tartrate to carvedilol but  will not do this presently.  Continue with nocturnal BiPAP.  Patient once stabilized should undergo right and left heart cardiac catheterization.  With chronic cough, doubt secondary to previous Entresto use.  Consider initiation of PPI in the event of reflux contribution.  Patient denies any postnasal drip.  Will most likely need prophylactic ICD.   Troy Sine, MD, [facility] 02/29/2020 6:12 PM

## 2020-02-29 NOTE — Plan of Care (Signed)

## 2020-03-01 DIAGNOSIS — I5023 Acute on chronic systolic (congestive) heart failure: Secondary | ICD-10-CM | POA: Diagnosis not present

## 2020-03-01 DIAGNOSIS — E876 Hypokalemia: Secondary | ICD-10-CM

## 2020-03-01 DIAGNOSIS — I5022 Chronic systolic (congestive) heart failure: Secondary | ICD-10-CM

## 2020-03-01 LAB — BASIC METABOLIC PANEL
Anion gap: 13 (ref 5–15)
BUN: 11 mg/dL (ref 6–20)
CO2: 27 mmol/L (ref 22–32)
Calcium: 9.3 mg/dL (ref 8.9–10.3)
Chloride: 97 mmol/L — ABNORMAL LOW (ref 98–111)
Creatinine, Ser: 1.08 mg/dL (ref 0.61–1.24)
GFR calc Af Amer: >60 mL/min (ref >60)
GFR calc non Af Amer: >60 mL/min (ref >60)
Glucose, Bld: 130 mg/dL — ABNORMAL HIGH (ref 70–99)
Potassium: 3.6 mmol/L (ref 3.5–5.1)
Sodium: 137 mmol/L (ref 135–145)

## 2020-03-01 MED ORDER — LORATADINE 10 MG PO TABS
10.0000 mg | ORAL_TABLET | Freq: Every day | ORAL | Status: DC
  Administered 2020-03-01 – 2020-03-11 (×11): 10 mg via ORAL
  Filled 2020-03-01 (×11): qty 1

## 2020-03-01 MED ORDER — FLUTICASONE PROPIONATE 50 MCG/ACT NA SUSP
2.0000 | Freq: Every day | NASAL | Status: DC
  Administered 2020-03-01 – 2020-03-06 (×6): 2 via NASAL
  Filled 2020-03-01: qty 16

## 2020-03-01 NOTE — Plan of Care (Signed)

## 2020-03-01 NOTE — Plan of Care (Signed)
?  Problem: Clinical Measurements: ?Goal: Will remain free from infection ?Outcome: Progressing ?Goal: Diagnostic test results will improve ?Outcome: Progressing ?Goal: Respiratory complications will improve ?Outcome: Progressing ?  ?

## 2020-03-01 NOTE — Progress Notes (Signed)
Went to patient's room to place on Bipap, patient told me he could not go on because he had just popped a cough drop in his mouth.  I explained to patient that it may be a while before I could get back to room again, patient understood and informed patient to have his RN call when he was ready to go on.  Notified Janett Billow, RN of conversation with patient, asked her to call when he was ready.

## 2020-03-01 NOTE — Progress Notes (Signed)
Progress Note  Patient Name: Phillip Young Date of Encounter: 03/01/2020  Primary Cardiologist: No primary care provider on file.   Subjective   Complains of sharp stabbing pains in left breast.  Still SOB.  Now complaining of lower abdominal pain.   Inpatient Medications    Scheduled Meds: . digoxin  0.125 mg Oral Daily  . fluticasone  2 spray Each Nare Daily  . furosemide  60 mg Intravenous BID  . heparin injection (subcutaneous)  5,000 Units Subcutaneous Q8H  . loratadine  10 mg Oral Daily  . metoprolol tartrate  12.5 mg Oral BID  . pantoprazole  40 mg Oral Daily  . pneumococcal 23 valent vaccine  0.5 mL Intramuscular Tomorrow-1000  . sacubitril-valsartan  1 tablet Oral BID  . sodium chloride flush  3 mL Intravenous Q12H  . spironolactone  25 mg Oral Daily   Continuous Infusions: . sodium chloride     PRN Meds: sodium chloride, acetaminophen, benzonatate, guaiFENesin-dextromethorphan, ondansetron (ZOFRAN) IV, polyethylene glycol, sodium chloride flush   Vital Signs    Vitals:   03/01/20 0948 03/01/20 1121 03/01/20 1204 03/01/20 1412  BP: (!) 109/57 94/61  (!) 107/59  Pulse:  100  100  Resp:  (!) 22 (!) 22 17  Temp:  97.8 F (36.6 C)    TempSrc:  Oral    SpO2:  98%  97%  Weight:      Height:        Intake/Output Summary (Last 24 hours) at 03/01/2020 1604 Last data filed at 03/01/2020 1200 Gross per 24 hour  Intake 1909 ml  Output 2290 ml  Net -381 ml   Filed Weights   02/28/20 0320 02/29/20 0346 03/01/20 0328  Weight: (!) 207.1 kg (!) 207.8 kg (!) 210.7 kg    Telemetry    NSR - Personally Reviewed  ECG    No new EKG to review - Personally Reviewed  Physical Exam   GEN: No acute distress.  Morbid obese Neck: No JVD Cardiac: RRR, no murmurs, rubs, or gallops.  Respiratory: Crackles in lungs at bases GI: Soft, nontender, non-distended  MS: 1+ LE brawny edema; No deformity. Neuro:  Nonfocal  Psych: Normal affect   Labs    Chemistry Recent  Labs  Lab 02/28/20 0351 02/29/20 0225 03/01/20 0133  NA 137 135 137  K 4.1 4.0 3.6  CL 100 97* 97*  CO2 '27 26 27  '$ GLUCOSE 137* 136* 130*  BUN '11 10 11  '$ CREATININE 1.17 1.08 1.08  CALCIUM 9.0 9.0 9.3  GFRNONAA >60 >60 >60  GFRAA >60 >60 >60  ANIONGAP '10 12 13     '$ Hematology Recent Labs  Lab 02/26/20 1701 02/27/20 0134  WBC 8.6 10.5  RBC 4.46 4.54  HGB 12.7* 13.1  HCT 40.8 41.1  MCV 91.5 90.5  MCH 28.5 28.9  MCHC 31.1 31.9  RDW 15.9* 15.9*  PLT 313 337    Cardiac EnzymesNo results for input(s): TROPONINI in the last 168 hours. No results for input(s): TROPIPOC in the last 168 hours.   BNP Recent Labs  Lab 02/26/20 1730  BNP 368.5*     DDimer No results for input(s): DDIMER in the last 168 hours.   Radiology    No results found.  Cardiac Studies   2D echo 02/27/2020 IMPRESSIONS    1. No intracardiac thrombus seen with Definity contrast. Left ventricular  ejection fraction, by estimation, is <20%. The left ventricle has severely  decreased function. The left ventricle demonstrates global  hypokinesis.  The left ventricular internal  cavity size was moderately to severely dilated. Left ventricular diastolic  parameters are consistent with Grade II diastolic dysfunction  (pseudonormalization). Elevated left atrial pressure.  2. Right ventricular systolic function is severely reduced. The right  ventricular size is severely enlarged. Tricuspid regurgitation signal is  inadequate for assessing PA pressure.  3. Left atrial size was severely dilated.  4. Right atrial size was severely dilated.  5. The mitral valve is normal in structure. Mild mitral valve  regurgitation.  6. The aortic valve is tricuspid. Aortic valve regurgitation is not  visualized. No aortic stenosis is present.  7. The inferior vena cava is dilated in size with <50% respiratory   Patient Profile     34 y.o. male  with a hx of past medical history of iron deficiency anemia,  hypertension, DM 2, undiagnosed obstructive sleep apnea, former tobacco abuse, morbid obesity, and history of biventricular heart failure who is being seen  for the evaluation of acute on chronic biventricular CHF at the request of Dr. Marthenia Rolling.  Assessment & Plan    1. Acute on chronic biventricular heart failure -Patient was initially on 20 mg twice daily of torsemide during the previous office visit in March with his primary cardiologist at [facility].  Since then, torsemide has been increased to 40 mg a.m. and 20 mg p.m. during the recent ED visit.   -Due to worsening shortness of breath, orthopnea and PND symptom, he eventually increased torsemide to 40 mg twice daily without significant improvement.  -Previous echocardiogram obtained at [facility] showed EF 20% with severe LV and RV dysfunction.  Repeat echocardiogram obtained on 02/26/2020 continue to show EF less than 20% with LV and RV dysfunction. -Even though patient was diagnosed with acute heart failure since 2019, he never underwent a left and right heart cath due to volume overload and orthopnea -currently on IV lasix '60mg'$  BID and put out 2.4L yesterday and is net neg 4.66L -SCr stable with diuresis at 1.08 today -his Losartan was changed to Entresto 24-'26mg'$  BID and cannot titrate further due to soft BP -continue low dose dig and BB        -check TSH -recommend AHF consult tomorrow and plan left and right heart cath on Monday once he is adequately diuresed.   -he needs to be evaluated for OSA as well which can be done as an outpt -Cardiomyopathy likely due to combination of issues include morbid obesity, hypertension, undiagnosed obstructive sleep apnea.   -Previous work-up showed positive inflammatory markers involve ANA, ESR and CRP, however negative viral panel, negative hepatitis panel, negative dsDNA antibody, negative SPEP, negative drug screen, negative sickle cell and a negative celiac disease. -Patient is fairly compliant with  medications and tries to avoid salty food. -Unfortunately he frequently drinks more than 2 L of fluid due to thirst after taking diuretic pill, this will need to be watched carefully.   2.  Hypertension:  -Continue spironolactone.  -Losartan changed to Entresto    -continue low-dose metoprolol   3.  DM 2: Although he previously carried a diagnosis of prediabetes, hemoglobin A1c obtained during this admission was 6.8 which placed the patient in the diabetic range.  4.  Morbid obesity:  -Weight loss imperative -needs outpt sleep study  5.  Undiagnosed obstructive sleep apnea:  -will get outpt split night sleep study  6.  Former tobacco abuse: Quit in 2019    I have spent a total of 45 minutes  with patient reviewing 2D echo , telemetry, EKGs, labs and examining patient as well as establishing an assessment and plan that was discussed with the patient.  > 50% of time was spent in direct patient care.    For questions or updates, please contact [place] Please consult [email or web address] for contact info under Cardiology/STEMI.      Signed, Fransico Him, MD  03/01/2020, 4:04 PM

## 2020-03-01 NOTE — Progress Notes (Signed)
Patient gained almost 3 kg for one day. Patient drinks a lot of juice and water. The reason is that he coughs then drink water helps for stop coughs as well as [place].  Reminded patient to reduce fluid intake and explained why he have to do. Patient seemed understood it, but continue to ask fluid. Patient has abdominal pain due to coughing and tylenol is not working for him. Asked Dr. Marthenia Rolling regarding this matter and cough medications ( tessalon & robitussin DM which aren't working). Dr. Marthenia Rolling didn't want to give other pain medications. Explained patient that MD didn't order for pain meds. Patient's EF is only less than 20% and talked cardiologist regarding consulting heart failure team. Cardiologist will consult to HF team. HS Truman Hayward RN

## 2020-03-01 NOTE — Progress Notes (Signed)
PROGRESS NOTE    Phillip Young  PQZ:[id number] DOB: 03-08-1986 DOA: 02/26/2020 PCP: Patient, No Pcp Per    Brief Narrative:  Patient was admitted to the hospital with a working diagnosis of acute on chronic systolic heart failure.  34 year old male with past medical history of biventricular congestive heart failure, ejection fraction 20%, obesity class III, obstructive sleep apnea, GERD and hidradenitis suppurativa who presents with dyspnea.  Patient reported 3 days of worsening dyspnea, associated with severe orthopnea GERD and lower extremity edema.  On his initial physical examination blood pressure 134/79, heart rate 104, respiratory rate thirty-four, oxygen saturation 91%.  His lungs had bibasilar Rales, no wheezing, heart S1-S2, present rhythmic, abdomen protuberant, positive lower extremity edema pitting bilaterally. Sodium 136, potassium 3.1, chloride 105, bicarb 29, glucose 102, BUN eight, creatinine 1.0, BNP 368, white count 8.6, hemoglobin 12.7, hematocrit 40.8, platelets 313.  SARS COVID-19 negative. Chest radiograph with positive cardiomegaly, diffuse bilateral interstitial infiltrates. EKG 103 bpm, rightward axis, normal intervals, sinus rhythm with left atrial enlargement, poor R wave progression, no significant ST segment or T wave changes.  Old records personally reviewed: cardiology follow up at [facility] 03/21.  Non ischemic cardiomyopathy with biventricular failure, b blockade then held due to acute decompensation and hypotension. He is under consideration for ICD.   Patient placed on aggressive diuresis with furosemide with good response.  February 29, 2020: Patient seen. Available records reviewed. Patient was admitted with acute on chronic biventricular heart failure. Patient is deemed to have nonischemic cardiomyopathy, but has never undergone ischemic work-up. Patient is morbidly obese, and likely has undiagnosed OSA/OHS. Patient is currently on IV diuretics. Patient is  slowly improving. Will consult cardiology team to assist with patient's management. Patient reports cough that may be secondary to postnasal drip syndrome.  March 01, 2020: Patient seen. Cardiology input is appreciated. For possible cardiac catheterization on Monday. Patient continues to cough. Patient is diuresing well. Patient will need work-up for likely OSA.  Assessment & Plan:   Principal Problem:   Acute on chronic systolic (congestive) heart failure ([name]) Active Problems:   Morbid obesity with body mass index (BMI) of 60.0 to 69.9 in adult [facility])   Essential hypertension   GERD without esophagitis   Diuretic-induced hypokalemia   OSA (obstructive sleep apnea)   Biventricular heart failure ([name])   Chronic cough   Acute on chronic systolic congestive heart failure ([name])   Hypokalemia   1. Acute on chronic biventricular heart failure, complicated with cardiogenic pulmonary edema and acute hypoxemic respiratory failure. E 20 to  25% 2019.  His blood pressure has remain stable with systolic 335 mmHG, urine output over last 24 H is 2,336 ml, with continue to improve dyspnea.   On IV diuresis with furosemide 60 mg q12 and spironolactone. Heart failure regimen with losartan and metoprolol.   Follow up echocardiography with EF less than 20%, with global hypokinesis, severe reduction in right ventricle systolic function, with severe dilatation.  February 29, 2020: Continue IV Lasix. Consult cardiology team. Patient may need ischemic work-up. Patient will need to complete work-up for likely obstructive sleep apnea. Patient remains morbidly obese. March 01, 2020: Continue diuresis. Cardiology is directing care.  2. Hypokalemia/ hypomagnesemia.  Tolerating well diuresis, clinically continue to be volume overloaded.  Renal function with serum cr at 1,17 with K at 4,1 and serum bicarbonate at 27.  Continue loop diuretic and spironolactone diuresis. Target a negative fluid balance.  February 29, 2020: Potassium today is 4. Renal  function is normal. Monitor renal function and electrolytes. March 01, 2020: Continue to monitor.  3. HTN. Stable blood pressure tolerating well diuresis,   4. GERD. Continue with pantoprazole.    5. Obesity class 3 with OSA. On qhs cpap as tolerted  Status is: Inpatient  Remains inpatient appropriate because:IV treatments appropriate due to intensity of illness or inability to take PO Patient was not able to follow up as outpatient due to high co-pay.   Dispo: The patient is from: Home              Anticipated d/c is to: Home              Anticipated d/c date is: 3 days              Patient currently is not medically stable to d/c.   DVT prophylaxis: Heparin   Code Status:   full  Family Communication:  I  Subjective: -No new complaints. -Patient continues to cough.   Objective: Vitals:   03/01/20 1121 03/01/20 1204 03/01/20 1412 03/01/20 1614  BP: 94/61  (!) 107/59 116/61  Pulse: 100  100 96  Resp: (!) 22 (!) 22 17 (!) 22  Temp: 97.8 F (36.6 C)   98.2 F (36.8 C)  TempSrc: Oral   Oral  SpO2: 98%  97% 97%  Weight:      Height:        Intake/Output Summary (Last 24 hours) at 03/01/2020 1747 Last data filed at 03/01/2020 1200 Gross per 24 hour  Intake 1909 ml  Output 2290 ml  Net -381 ml   Filed Weights   02/28/20 0320 02/29/20 0346 03/01/20 0328  Weight: (!) 207.1 kg (!) 207.8 kg (!) 210.7 kg    Examination:   General: Morbidly obese. Not in any significant distress.   Neurology: Awake and alert, non focal. Patient moves all extremities. HEENT: no pallor, no jaundice. JVD is difficult to assess. Cardiovascular: S1-S2. Pulmonary: Decreased air entry globally. Gastrointestinal. Abdomen morbidly obese, soft and non tender. Organs are difficult to assess. Extremities: Bilateral leg edema.   Data Reviewed: I have personally reviewed following labs and imaging studies  CBC: Recent Labs  Lab 02/26/20 1701 02/27/20 0134   WBC 8.6 10.5  NEUTROABS 6.2 7.9*  HGB 12.7* 13.1  HCT 40.8 41.1  MCV 91.5 90.5  PLT 313 010   Basic Metabolic Panel: Recent Labs  Lab 02/26/20 1701 02/27/20 0108 02/27/20 0134 02/28/20 0351 02/29/20 0225 03/01/20 0133  NA 136  --  138 137 135 137  K 3.1*  --  3.2* 4.1 4.0 3.6  CL 95*  --  96* 100 97* 97*  CO2 29  --  28 27 26 27   GLUCOSE 102*  --  144* 137* 136* 130*  BUN 8  --  8 11 10 11   CREATININE 1.08  --  1.09 1.17 1.08 1.08  CALCIUM 9.0  --  8.9 9.0 9.0 9.3  MG  --  1.6* 1.7  --   --   --    GFR: Estimated Creatinine Clearance: 176.6 mL/min (by C-G formula based on SCr of 1.08 mg/dL). Liver Function Tests: No results for input(s): AST, ALT, ALKPHOS, BILITOT, PROT, ALBUMIN in the last 168 hours. No results for input(s): LIPASE, AMYLASE in the last 168 hours. No results for input(s): AMMONIA in the last 168 hours. Coagulation Profile: No results for input(s): INR, PROTIME in the last 168 hours. Cardiac Enzymes: No results for input(s): CKTOTAL, CKMB,  CKMBINDEX, TROPONINI in the last 168 hours. BNP (last 3 results) No results for input(s): PROBNP in the last 8760 hours. HbA1C: No results for input(s): HGBA1C in the last 72 hours. CBG: No results for input(s): GLUCAP in the last 168 hours. Lipid Profile: No results for input(s): CHOL, HDL, LDLCALC, TRIG, CHOLHDL, LDLDIRECT in the last 72 hours. Thyroid Function Tests: No results for input(s): TSH, T4TOTAL, FREET4, T3FREE, THYROIDAB in the last 72 hours. Anemia Panel: No results for input(s): VITAMINB12, FOLATE, FERRITIN, TIBC, IRON, RETICCTPCT in the last 72 hours.    Radiology Studies: I have reviewed all of the imaging during this hospital visit personally  Scheduled Meds: . digoxin  0.125 mg Oral Daily  . fluticasone  2 spray Each Nare Daily  . furosemide  60 mg Intravenous BID  . heparin injection (subcutaneous)  5,000 Units Subcutaneous Q8H  . loratadine  10 mg Oral Daily  . metoprolol tartrate   12.5 mg Oral BID  . pantoprazole  40 mg Oral Daily  . pneumococcal 23 valent vaccine  0.5 mL Intramuscular Tomorrow-1000  . sacubitril-valsartan  1 tablet Oral BID  . sodium chloride flush  3 mL Intravenous Q12H  . spironolactone  25 mg Oral Daily   Continuous Infusions: . sodium chloride       LOS: 4 days   Greater than 35 minutes spent in direct patient care.  Bonnell Public, MD

## 2020-03-02 ENCOUNTER — Inpatient Hospital Stay: Payer: Self-pay

## 2020-03-02 DIAGNOSIS — I5023 Acute on chronic systolic (congestive) heart failure: Secondary | ICD-10-CM | POA: Diagnosis not present

## 2020-03-02 LAB — BASIC METABOLIC PANEL
Anion gap: 11 (ref 5–15)
BUN: 7 mg/dL (ref 6–20)
CO2: 28 mmol/L (ref 22–32)
Calcium: 8.5 mg/dL — ABNORMAL LOW (ref 8.9–10.3)
Chloride: 96 mmol/L — ABNORMAL LOW (ref 98–111)
Creatinine, Ser: 1.01 mg/dL (ref 0.61–1.24)
GFR calc Af Amer: >60 mL/min (ref >60)
GFR calc non Af Amer: >60 mL/min (ref >60)
Glucose, Bld: 139 mg/dL — ABNORMAL HIGH (ref 70–99)
Potassium: 3.4 mmol/L — ABNORMAL LOW (ref 3.5–5.1)
Sodium: 135 mmol/L (ref 135–145)

## 2020-03-02 LAB — COOXEMETRY PANEL
Carboxyhemoglobin: 1.8 % — ABNORMAL HIGH (ref 0.5–1.5)
Methemoglobin: 0.9 % (ref 0.0–1.5)
O2 Saturation: 64.6 %
Total hemoglobin: 12.6 g/dL (ref 12.0–16.0)

## 2020-03-02 MED ORDER — METOLAZONE 2.5 MG PO TABS
2.5000 mg | ORAL_TABLET | Freq: Once | ORAL | Status: AC
  Administered 2020-03-02: 2.5 mg via ORAL
  Filled 2020-03-02: qty 1

## 2020-03-02 MED ORDER — SODIUM CHLORIDE 0.9% FLUSH
10.0000 mL | Freq: Two times a day (BID) | INTRAVENOUS | Status: DC
  Administered 2020-03-02 – 2020-03-10 (×14): 10 mL

## 2020-03-02 MED ORDER — POTASSIUM CHLORIDE CRYS ER 20 MEQ PO TBCR
40.0000 meq | EXTENDED_RELEASE_TABLET | Freq: Once | ORAL | Status: AC
  Administered 2020-03-02: 40 meq via ORAL
  Filled 2020-03-02: qty 2

## 2020-03-02 MED ORDER — IVABRADINE HCL 5 MG PO TABS
5.0000 mg | ORAL_TABLET | Freq: Two times a day (BID) | ORAL | Status: DC
  Administered 2020-03-02 – 2020-03-10 (×17): 5 mg via ORAL
  Filled 2020-03-02 (×21): qty 1

## 2020-03-02 MED ORDER — FUROSEMIDE 10 MG/ML IJ SOLN
15.0000 mg/h | INTRAVENOUS | Status: DC
  Administered 2020-03-02 – 2020-03-04 (×3): 12 mg/h via INTRAVENOUS
  Administered 2020-03-06 – 2020-03-08 (×4): 15 mg/h via INTRAVENOUS
  Filled 2020-03-02 (×9): qty 25
  Filled 2020-03-02: qty 21

## 2020-03-02 MED ORDER — CHLORHEXIDINE GLUCONATE CLOTH 2 % EX PADS
6.0000 | MEDICATED_PAD | Freq: Every day | CUTANEOUS | Status: DC
  Administered 2020-03-02 – 2020-03-11 (×9): 6 via TOPICAL

## 2020-03-02 MED ORDER — CARVEDILOL 3.125 MG PO TABS
3.1250 mg | ORAL_TABLET | Freq: Two times a day (BID) | ORAL | Status: DC
  Administered 2020-03-02 – 2020-03-04 (×5): 3.125 mg via ORAL
  Filled 2020-03-02 (×5): qty 1

## 2020-03-02 MED ORDER — SODIUM CHLORIDE 0.9% FLUSH
10.0000 mL | INTRAVENOUS | Status: DC | PRN
  Administered 2020-03-04: 10 mL

## 2020-03-02 NOTE — Progress Notes (Signed)
PROGRESS NOTE    Delois Tolbert  GYI:[id number] DOB: [birth year] DOA: 02/26/2020 PCP: Patient, No Pcp Per    Brief Narrative:  Patient was admitted to the hospital with a working diagnosis of acute on chronic biventricular heart failure.  Patient is a 34 year old male with past medical history of biventricular congestive heart failure, ejection fraction 20%, obesity class III, likely undiagnosed obstructive sleep apnea, GERD and hidradenitis suppurativa.  Patient was admitted with worsening dyspnea on minimal exertion, orthopnea and edema. On his initial physical examination blood pressure 134/79, heart rate 104, respiratory rate thirty-four, oxygen saturation 91%.  His lungs had bibasilar Rales, no wheezing, heart S1-S2, present rhythmic, abdomen protuberant, positive lower extremity edema pitting bilaterally. Sodium 136, potassium 3.1, chloride 105, bicarb 29, glucose 102, BUN eight, creatinine 1.0, BNP 368, white count 8.6, hemoglobin 12.7, hematocrit 40.8, platelets 313.  SARS COVID-19 negative.  Chest radiograph revealed positive cardiomegaly, diffuse bilateral interstitial infiltrates.  EKG 103 bpm, rightward axis, normal intervals, sinus rhythm with left atrial enlargement, poor R wave progression, no significant ST segment or T wave changes.  Apparently, patient had been seen at [facility] system was being managed for non ischemic cardiomyopathy with biventricular failure.  Patient has not had ischemic work-up due to significant orthopnea.  He is under consideration for ICD.   Patient was admitted and started on IV diuretics.  Cardiology team was consulted to assist with patient's management.  Heart failure team has been consulted, and they are directing care.  Patient is not on Lasix drip.  Patient is also on Aldactone, Entresto, digoxin, Coreg and ivabradine.  Patient received metolazone briefly.  The plan is to proceed with cardiac catheterization when patient is able to lie down  flat.  Assessment & Plan:   Principal Problem:   Acute on chronic systolic (congestive) heart failure ([name]) Active Problems:   Morbid obesity with body mass index (BMI) of 60.0 to 69.9 in adult [facility])   Essential hypertension   GERD without esophagitis   Diuretic-induced hypokalemia   OSA (obstructive sleep apnea)   Biventricular heart failure ([name])   Chronic cough   Acute on chronic systolic congestive heart failure ([name])   Hypokalemia   1. Acute on chronic biventricular heart failure, complicated with cardiogenic pulmonary edema and acute hypoxemic respiratory failure: -Current medication is as documented above.   -Last known EF of 20 to 25%. -Currently on Lasix drip. -For ischemic work-up when patient is able to lie down flat.  Follow up echocardiography done on 02/27/2020 revealed:   1. No intracardiac thrombus seen with Definity contrast. Left ventricular  ejection fraction, by estimation, is <20%. The left ventricle has severely  decreased function. The left ventricle demonstrates global hypokinesis.  The left ventricular internal  cavity size was moderately to severely dilated. Left ventricular diastolic  parameters are consistent with Grade II diastolic dysfunction  (pseudonormalization). Elevated left atrial pressure.  2. Right ventricular systolic function is severely reduced. The right  ventricular size is severely enlarged. Tricuspid regurgitation signal is  inadequate for assessing PA pressure.  3. Left atrial size was severely dilated.  4. Right atrial size was severely dilated.  5. The mitral valve is normal in structure. Mild mitral valve  regurgitation.  6. The aortic valve is tricuspid. Aortic valve regurgitation is not  visualized. No aortic stenosis is present.  7. The inferior vena cava is dilated in size with <50% respiratory  variability, suggesting right atrial pressure of 15 mmHg.  2. Hypokalemia/ hypomagnesemia: -Repeat  potassium is 3.4  today. -We will continue to correct. -Magnesium on 02/27/2020 was 1.7.   3. HTN. Stable blood pressure tolerating well diuresis,   4. GERD. Continue with pantoprazole.    5. Obesity class 3 with OSA: -sleep studies.   -Diet and exercise  Status is: Inpatient  Remains inpatient appropriate because:IV treatments appropriate due to intensity of illness or inability to take PO Patient was not able to follow up as outpatient due to high co-pay.   Dispo: The patient is from: Home              Anticipated d/c is to: Home              Anticipated d/c date is: 3 days              Patient currently is not medically stable to d/c.   DVT prophylaxis: Heparin   Code Status:   full  Family Communication:    Subjective: -No new complaints.  Objective: Vitals:   03/01/20 2315 03/02/20 0232 03/02/20 0749 03/02/20 1044  BP: 109/76 (!) 98/55 (!) 104/49 113/67  Pulse: 98 97 89 (!) 104  Resp: 20 20 (!) 22 20  Temp: 98.3 F (36.8 C) 97.9 F (36.6 C) 97.8 F (36.6 C) 97.9 F (36.6 C)  TempSrc: Oral Oral Oral Oral  SpO2: 95% 100% 97% 96%  Weight:  (!) 208.3 kg    Height:        Intake/Output Summary (Last 24 hours) at 03/02/2020 1455 Last data filed at 03/02/2020 1300 Gross per 24 hour  Intake 1836 ml  Output 6025 ml  Net -4189 ml   Filed Weights   02/29/20 0346 03/01/20 0328 03/02/20 0232  Weight: (!) 207.8 kg (!) 210.7 kg (!) 208.3 kg    Examination:   General: Morbidly obese. Not in any significant distress.   Neurology: Awake and alert, non focal. Patient moves all extremities. HEENT: no pallor, no jaundice. JVD is difficult to assess. Cardiovascular: S1-S2. Pulmonary: Decreased air entry globally. Gastrointestinal. Abdomen morbidly obese, soft and non tender. Organs are difficult to assess. Extremities: Bilateral leg edema.   Data Reviewed: I have personally reviewed following labs and imaging studies  CBC: Recent Labs  Lab 02/26/20 1701 02/27/20 0134  WBC 8.6  10.5  NEUTROABS 6.2 7.9*  HGB 12.7* 13.1  HCT 40.8 41.1  MCV 91.5 90.5  PLT 313 947   Basic Metabolic Panel: Recent Labs  Lab 02/26/20 1701 02/27/20 0108 02/27/20 0134 02/28/20 0351 02/29/20 0225 03/01/20 0133 03/02/20 0213  NA   < >  --  138 137 135 137 135  K   < >  --  3.2* 4.1 4.0 3.6 3.4*  CL   < >  --  96* 100 97* 97* 96*  CO2   < >  --  28 27 26 27 28   GLUCOSE   < >  --  144* 137* 136* 130* 139*  BUN   < >  --  8 11 10 11 7   CREATININE   < >  --  1.09 1.17 1.08 1.08 1.01  CALCIUM   < >  --  8.9 9.0 9.0 9.3 8.5*  MG  --  1.6* 1.7  --   --   --   --    < > = values in this interval not displayed.   GFR: Estimated Creatinine Clearance: 187.6 mL/min (by C-G formula based on SCr of 1.01 mg/dL). Liver Function Tests: No results  for input(s): AST, ALT, ALKPHOS, BILITOT, PROT, ALBUMIN in the last 168 hours. No results for input(s): LIPASE, AMYLASE in the last 168 hours. No results for input(s): AMMONIA in the last 168 hours. Coagulation Profile: No results for input(s): INR, PROTIME in the last 168 hours. Cardiac Enzymes: No results for input(s): CKTOTAL, CKMB, CKMBINDEX, TROPONINI in the last 168 hours. BNP (last 3 results) No results for input(s): PROBNP in the last 8760 hours. HbA1C: No results for input(s): HGBA1C in the last 72 hours. CBG: No results for input(s): GLUCAP in the last 168 hours. Lipid Profile: No results for input(s): CHOL, HDL, LDLCALC, TRIG, CHOLHDL, LDLDIRECT in the last 72 hours. Thyroid Function Tests: No results for input(s): TSH, T4TOTAL, FREET4, T3FREE, THYROIDAB in the last 72 hours. Anemia Panel: No results for input(s): VITAMINB12, FOLATE, FERRITIN, TIBC, IRON, RETICCTPCT in the last 72 hours.    Radiology Studies: I have reviewed all of the imaging during this hospital visit personally  Scheduled Meds: . carvedilol  3.125 mg Oral BID WC  . digoxin  0.125 mg Oral Daily  . fluticasone  2 spray Each Nare Daily  . heparin  injection (subcutaneous)  5,000 Units Subcutaneous Q8H  . ivabradine  5 mg Oral BID WC  . loratadine  10 mg Oral Daily  . pantoprazole  40 mg Oral Daily  . pneumococcal 23 valent vaccine  0.5 mL Intramuscular Tomorrow-1000  . sacubitril-valsartan  1 tablet Oral BID  . sodium chloride flush  3 mL Intravenous Q12H  . spironolactone  25 mg Oral Daily   Continuous Infusions: . sodium chloride    . furosemide (LASIX) infusion 12 mg/hr (03/02/20 1319)     LOS: 5 days   Greater than 35 minutes spent in direct patient care.  Bonnell Public, MD

## 2020-03-02 NOTE — Progress Notes (Signed)
Orthopedic Tech Progress Note Patient Details:  Phillip Young April 04, 1986 [id number]  Ortho Devices Type of Ortho Device: Haematologist Ortho Device/Splint Location: BLE Ortho Device/Splint Interventions: Ordered, Application   Post Interventions Patient Tolerated: Well   Phillip Young 03/02/2020, 12:34 PM

## 2020-03-02 NOTE — Consult Note (Signed)
Advanced Heart Failure Team Consult Note   Primary Physician: Patient, No Pcp Per PCP-Cardiologist:  No primary care provider on file.  Reason for Consultation: CHF  HPI:    Phillip Young is seen today for evaluation of CHF at the request of Dr. Radford Pax.   34 y.o. with history of cardiomyopathy and obesity.  He has been known to have a cardiomyopathy (biventricular failure) since back in 2019, echo in 2019 with EF 20% [facility]).  He has been followed in the [facility] system.  No history of cath.  Suspected to have OSA but never tested.  He reports progressive dyspnea and weight gain over the last few months.  He thinks that his weight is up >75 lbs in a few months.  He was admitted with worsening dyspnea and orthopnea.  He was started on IV Lasix. I/Os negative yesterday and weight down 2 lbs, but patient also reports that his uncle brought him in 2 torsemide pills to take since he did not feel like the IV Lasix was working.    This morning, he remains tachypneic and orthopneic.  He is sitting on the side of the bed.  Sinus tachy in 100s.  SBP 100s.    Review of Systems:  All systems reviewed and negative except as per HPI.   Home Medications Prior to Admission medications   Medication Sig Start Date End Date Taking? Authorizing Provider  spironolactone (ALDACTONE) 25 MG tablet Take 12.5 mg by mouth daily. 01/16/20  Yes [provider]  torsemide (DEMADEX) 20 MG tablet Take 20 mg by mouth in the morning and at bedtime. 01/16/20  Yes [provider]  potassium chloride SA (KLOR-CON) 20 MEQ tablet Take 1 tablet (20 mEq total) by mouth daily. 02/26/20   Margarita Mail, PA-C    Past Medical History: 1. Hidradenitis suppuritiva 2. GERD 3. OSA: Suspected.  4. Diabetes 5. Morbid obesity 6. Fe deficiency anemia 7. Chronic systolic CHF: No history of ETOH or drugs.  +ANA.  No strong FH of cardiomyopathy.  - Echo (10/19, [facility]): EF 20%.  - Echo (5/21, [facility]): EF 20% - Echo (8/21): EF < 20%, moderate LV dilation, severely decreased RV function with severe RV dilation, severe biatrial enlargement, mild MR.  8. Prior smoker.   Past Surgical History: Past Surgical History:  Procedure Laterality Date  . ABSCESS DRAINAGE      Family History: Family History  Problem Relation Age of Onset  . Heart disease Mother   . Hypertension Mother   . Pulmonary Hypertension Mother   . Drug abuse Father        died due to Heroin overdose    Social History: Social History   Socioeconomic History  . Marital status: Unknown    Spouse name: Not on file  . Number of children: Not on file  . Years of education: Not on file  . Highest education level: Not on file  Occupational History  . Not on file  Tobacco Use  . Smoking status: Former Research scientist (life sciences)  . Smokeless tobacco: Former Systems developer  . Tobacco comment: quit in 2019  Substance and Sexual Activity  . Alcohol use: Never  . Drug use: Never  . Sexual activity: Not on file  Other Topics Concern  . Not on file  Social History Narrative  . Not on file   Social Determinants of Health   Financial Resource Strain:   . Difficulty of Paying Living Expenses:   Food Insecurity:   .  Worried About Charity fundraiser in the Last Year:   . Arboriculturist in the Last Year:   Transportation Needs:   . Film/video editor (Medical):   Marland Kitchen Lack of Transportation (Non-Medical):   Physical Activity:   . Days of Exercise per Week:   . Minutes of Exercise per Session:   Stress:   . Feeling of Stress :   Social Connections:   . Frequency of Communication with Friends and Family:   . Frequency of Social Gatherings with Friends and Family:   . Attends Religious Services:   . Active Member of Clubs or Organizations:   . Attends Archivist Meetings:   Marland Kitchen Marital Status:     Allergies:  No Known Allergies  Objective:    Vital Signs:   Temp:  [97.8 F (36.6 C)-98.3 F (36.8 C)] 97.8 F (36.6  C) (08/08 0749) Pulse Rate:  [89-107] 89 (08/08 0749) Resp:  [17-22] 22 (08/08 0749) BP: (94-139)/(49-76) 104/49 (08/08 0749) SpO2:  [95 %-100 %] 97 % (08/08 0749) Weight:  [208.3 kg] 208.3 kg (08/08 0232) Last BM Date: 03/01/20  Weight change: Filed Weights   02/29/20 0346 03/01/20 0328 03/02/20 0232  Weight: (!) 207.8 kg (!) 210.7 kg (!) 208.3 kg    Intake/Output:   Intake/Output Summary (Last 24 hours) at 03/02/2020 1042 Last data filed at 03/02/2020 1023 Gross per 24 hour  Intake 1956 ml  Output 5505 ml  Net -3549 ml      Physical Exam    General:  Tachypneic HEENT: normal Neck: supple. JVP 16+. Carotids 2+ bilat; no bruits. No lymphadenopathy or thyromegaly appreciated. Cor: PMI lateral. Mildly tachy, regular rate & rhythm. No rubs, gallops or murmurs. Lungs: clear Abdomen: soft, nontender, nondistended. No hepatosplenomegaly. No bruits or masses. Good bowel sounds. Extremities: no cyanosis, clubbing, rash. 2+ edema to thighs.  Neuro: alert & orientedx3, cranial nerves grossly intact. moves all 4 extremities w/o difficulty. Affect pleasant   Telemetry   ST 100s (personally reviewed)  EKG    ST, right axis deviation, iRBBB (personally reviewed)  Labs   Basic Metabolic Panel: Recent Labs  Lab 02/26/20 1701 02/27/20 0108 02/27/20 0134 02/27/20 0134 02/28/20 0351 02/28/20 0351 02/29/20 0225 03/01/20 0133 03/02/20 0213  NA   < >  --  138  --  137  --  135 137 135  K   < >  --  3.2*  --  4.1  --  4.0 3.6 3.4*  CL   < >  --  96*  --  100  --  97* 97* 96*  CO2   < >  --  28  --  27  --  26 27 28   GLUCOSE   < >  --  144*  --  137*  --  136* 130* 139*  BUN   < >  --  8  --  11  --  10 11 7   CREATININE   < >  --  1.09  --  1.17  --  1.08 1.08 1.01  CALCIUM   < >  --  8.9   < > 9.0   < > 9.0 9.3 8.5*  MG  --  1.6* 1.7  --   --   --   --   --   --    < > = values in this interval not displayed.    Liver Function Tests: No results for input(s): AST, ALT,  ALKPHOS, BILITOT, PROT, ALBUMIN in the last 168 hours. No results for input(s): LIPASE, AMYLASE in the last 168 hours. No results for input(s): AMMONIA in the last 168 hours.  CBC: Recent Labs  Lab 02/26/20 1701 02/27/20 0134  WBC 8.6 10.5  NEUTROABS 6.2 7.9*  HGB 12.7* 13.1  HCT 40.8 41.1  MCV 91.5 90.5  PLT 313 337    Cardiac Enzymes: No results for input(s): CKTOTAL, CKMB, CKMBINDEX, TROPONINI in the last 168 hours.  BNP: BNP (last 3 results) Recent Labs    02/26/20 1730  BNP 368.5*    ProBNP (last 3 results) No results for input(s): PROBNP in the last 8760 hours.   CBG: No results for input(s): GLUCAP in the last 168 hours.  Coagulation Studies: No results for input(s): LABPROT, INR in the last 72 hours.   Imaging   [place] EKG SITE RITE  Result Date: 03/02/2020 If Site Rite image not attached, placement could not be confirmed due to current cardiac rhythm.     Medications:     Current Medications: . carvedilol  3.125 mg Oral BID WC  . digoxin  0.125 mg Oral Daily  . fluticasone  2 spray Each Nare Daily  . heparin injection (subcutaneous)  5,000 Units Subcutaneous Q8H  . ivabradine  5 mg Oral BID WC  . loratadine  10 mg Oral Daily  . metolazone  2.5 mg Oral Once  . pantoprazole  40 mg Oral Daily  . pneumococcal 23 valent vaccine  0.5 mL Intramuscular Tomorrow-1000  . potassium chloride  40 mEq Oral Once  . sacubitril-valsartan  1 tablet Oral BID  . sodium chloride flush  3 mL Intravenous Q12H  . spironolactone  25 mg Oral Daily     Infusions: . sodium chloride    . furosemide (LASIX) infusion        Assessment/Plan   1. Acute on chronic systolic CHF: Known cardiomyopathy since 2019.  Echo this admission with EF < 20%, moderate LV dilation, severely decreased RV function with severe RV dilation, severe biatrial enlargement, mild MR. No history of ETOH/drugs.  No FH of cardiomyopathy (mother with [place]).  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  Suspect he is too large for cardiac MRI.  Narrow QRS on ECG so not CRT candidate.  He is markedly volume overloaded on exam and tachypneic.  Says Lasix boluses don't work so took extra torsemide that his family brought in last night.  - Asked not to bring in outside meds.  - He has had Lasix 60 mg IV x 1 today so far, will start Lasix gtt 12 mg/hr and will give metolazone 2.5 x 1.  Replace K.  - Place PICC to follow CVP and co-ox (?need for milrinone).  - Stop metoprolol, can use low dose Coreg 3.125 mg bid for now.  - Ivabradine 5 mg bid.  - Continue digoxin 0.125 daily.  - Can continue current Entresto and spironolactone as long as BP remains stable.  - Unna boots.  - Repeat ANA, ?strongly positive.  - Will need right/left heart cath when CHF is better-controlled.  2. OSA: Strongly suspect.  Needs sleep study as outpatient.  3. Diabetes: Eventual SGLT2 inhibitor.   Length of Stay: [place], MD  03/02/2020, 10:42 AM  Advanced Heart Failure Team Pager [phone] (M-F; 7a - 4p)  Please contact [facility] for night-coverage after hours (4p -7a ) and weekends on [email or web address]

## 2020-03-02 NOTE — Progress Notes (Signed)
Peripherally Inserted Central Catheter Placement  The IV Nurse has discussed with the patient and/or persons authorized to consent for the patient, the purpose of this procedure and the potential benefits and risks involved with this procedure.  The benefits include less needle sticks, lab draws from the catheter, and the patient may be discharged home with the catheter. Risks include, but not limited to, infection, bleeding, blood clot (thrombus formation), and puncture of an artery; nerve damage and irregular heartbeat and possibility to perform a PICC exchange if needed/ordered by physician.  Alternatives to this procedure were also discussed.  Bard Power PICC patient education guide, fact sheet on infection prevention and patient information card has been provided to patient /or left at bedside.    PICC Placement Documentation  PICC Double Lumen 03/02/20 PICC Right Cephalic 46 cm 1 cm (Active)  Indication for Insertion or Continuance of Line Vasoactive infusions;Chronic illness with exacerbations (CF, Sickle Cell, etc.) 03/02/20 1456  Exposed Catheter (cm) 1 cm 03/02/20 1456  Site Assessment Clean;Dry;Intact 03/02/20 1456  Lumen #1 Status Flushed;Saline locked;Blood return noted 03/02/20 1456  Lumen #2 Status Flushed;Saline locked;Blood return noted 03/02/20 1456  Dressing Type Transparent 03/02/20 1456  Dressing Status Clean;Intact;Dry;Antimicrobial disc in place 03/02/20 1456  Safety Lock Not Applicable 33/74/45 1460  Line Care Connections checked and tightened 03/02/20 1456  Line Adjustment (NICU/IV Team Only) No 03/02/20 1456  Dressing Intervention New dressing 03/02/20 1456  Dressing Change Due 03/09/20 03/02/20 1456       Rolena Infante 03/02/2020, 2:57 PM

## 2020-03-03 DIAGNOSIS — I5023 Acute on chronic systolic (congestive) heart failure: Secondary | ICD-10-CM | POA: Diagnosis not present

## 2020-03-03 LAB — BASIC METABOLIC PANEL
Anion gap: 12 (ref 5–15)
BUN: 8 mg/dL (ref 6–20)
CO2: 32 mmol/L (ref 22–32)
Calcium: 8.6 mg/dL — ABNORMAL LOW (ref 8.9–10.3)
Chloride: 92 mmol/L — ABNORMAL LOW (ref 98–111)
Creatinine, Ser: 1.05 mg/dL (ref 0.61–1.24)
GFR calc Af Amer: >60 mL/min (ref >60)
GFR calc non Af Amer: >60 mL/min (ref >60)
Glucose, Bld: 167 mg/dL — ABNORMAL HIGH (ref 70–99)
Potassium: 3.2 mmol/L — ABNORMAL LOW (ref 3.5–5.1)
Sodium: 136 mmol/L (ref 135–145)

## 2020-03-03 LAB — MAGNESIUM: Magnesium: 1.9 mg/dL (ref 1.7–2.4)

## 2020-03-03 MED ORDER — SODIUM CHLORIDE 0.9 % IV SOLN: 250.0000 mL | INTRAVENOUS | Status: DC | PRN

## 2020-03-03 MED ORDER — TRAMADOL HCL 50 MG PO TABS
50.0000 mg | ORAL_TABLET | Freq: Two times a day (BID) | ORAL | Status: DC | PRN
  Administered 2020-03-03 – 2020-03-04 (×3): 50 mg via ORAL
  Filled 2020-03-03 (×3): qty 1

## 2020-03-03 MED ORDER — POTASSIUM CHLORIDE CRYS ER 10 MEQ PO TBCR
40.0000 meq | EXTENDED_RELEASE_TABLET | Freq: Once | ORAL | Status: AC
  Administered 2020-03-03: 40 meq via ORAL
  Filled 2020-03-03: qty 4

## 2020-03-03 MED ORDER — SODIUM CHLORIDE 0.9 % IV SOLN: INTRAVENOUS | Status: DC

## 2020-03-03 MED ORDER — METOLAZONE 2.5 MG PO TABS
2.5000 mg | ORAL_TABLET | Freq: Once | ORAL | Status: AC
  Administered 2020-03-03: 2.5 mg via ORAL
  Filled 2020-03-03: qty 1

## 2020-03-03 MED ORDER — MAGNESIUM SULFATE 2 GM/50ML IV SOLN
2.0000 g | Freq: Once | INTRAVENOUS | Status: AC
  Administered 2020-03-03: 2 g via INTRAVENOUS
  Filled 2020-03-03: qty 50

## 2020-03-03 MED ORDER — SODIUM CHLORIDE 0.9% FLUSH
3.0000 mL | Freq: Two times a day (BID) | INTRAVENOUS | Status: DC
  Administered 2020-03-03 – 2020-03-09 (×6): 3 mL via INTRAVENOUS

## 2020-03-03 MED ORDER — ASPIRIN 81 MG PO CHEW
81.0000 mg | CHEWABLE_TABLET | ORAL | Status: AC
  Administered 2020-03-04: 81 mg via ORAL
  Filled 2020-03-03: qty 1

## 2020-03-03 MED ORDER — SODIUM CHLORIDE 0.9% FLUSH: 3.0000 mL | INTRAVENOUS | Status: DC | PRN

## 2020-03-03 MED ORDER — POTASSIUM CHLORIDE CRYS ER 20 MEQ PO TBCR
40.0000 meq | EXTENDED_RELEASE_TABLET | Freq: Two times a day (BID) | ORAL | Status: AC
  Administered 2020-03-03: 40 meq via ORAL
  Filled 2020-03-03 (×2): qty 2

## 2020-03-03 NOTE — Progress Notes (Signed)
Patient ID: Phillip [name], male   DOB: 03-14-1986, 34 y.o.   MRN: [id number]     Advanced Heart Failure Rounding Note  PCP-Cardiologist: No primary care provider on file.   Subjective:    Excellent diuresis yesterday, weight down 6 lbs.  Still tachypneic and more short of breath than baseline.   CVP 13-14, co-ox 65%.    Objective:   Weight Range: (!) 205.7 kg Body mass index is 61.5 kg/m.   Vital Signs:   Temp:  [97.5 F (36.4 C)-98.4 F (36.9 C)] 98.4 F (36.9 C) (08/09 0308) Pulse Rate:  [96-104] 96 (08/09 0308) Resp:  [18-20] 18 (08/09 0308) BP: (104-115)/(53-85) 115/80 (08/09 0308) SpO2:  [92 %-99 %] 92 % (08/09 0308) Weight:  [205.7 kg] 205.7 kg (08/09 0308) Last BM Date: 03/02/20  Weight change: Filed Weights   03/01/20 0328 03/02/20 0232 03/03/20 0308  Weight: (!) 210.7 kg (!) 208.3 kg (!) 205.7 kg    Intake/Output:   Intake/Output Summary (Last 24 hours) at 03/03/2020 0844 Last data filed at 03/03/2020 0800 Gross per 24 hour  Intake 1920.79 ml  Output 9325 ml  Net -7404.21 ml      Physical Exam    General:  Well appearing. No resp difficulty HEENT: Normal Neck: Supple. JVP 12 cm. Carotids 2+ bilat; no bruits. No lymphadenopathy or thyromegaly appreciated. Cor: PMI nondisplaced. Regular rate & rhythm. No rubs, gallops or murmurs. Lungs: Clear Abdomen: Soft, nontender, nondistended. No hepatosplenomegaly. No bruits or masses. Good bowel sounds. Extremities: No cyanosis, clubbing, rash.  1+ edema to knees.  Neuro: Alert & orientedx3, cranial nerves grossly intact. moves all 4 extremities w/o difficulty. Affect pleasant   Telemetry   NSR 90s (personally reviewed)  Labs    CBC No results for input(s): WBC, NEUTROABS, HGB, HCT, MCV, PLT in the last 72 hours. Basic Metabolic Panel Recent Labs    03/01/20 0133 03/02/20 0213 03/03/20 0050  NA 137 135  --   K 3.6 3.4*  --   CL 97* 96*  --   CO2 27 28  --   GLUCOSE 130* 139*  --   BUN 11 7  --    CREATININE 1.08 1.01  --   CALCIUM 9.3 8.5*  --   MG  --   --  1.9   Liver Function Tests No results for input(s): AST, ALT, ALKPHOS, BILITOT, PROT, ALBUMIN in the last 72 hours. No results for input(s): LIPASE, AMYLASE in the last 72 hours. Cardiac Enzymes No results for input(s): CKTOTAL, CKMB, CKMBINDEX, TROPONINI in the last 72 hours.  BNP: BNP (last 3 results) Recent Labs    02/26/20 1730  BNP 368.5*    ProBNP (last 3 results) No results for input(s): PROBNP in the last 8760 hours.   D-Dimer No results for input(s): DDIMER in the last 72 hours. Hemoglobin A1C No results for input(s): HGBA1C in the last 72 hours. Fasting Lipid Panel No results for input(s): CHOL, HDL, LDLCALC, TRIG, CHOLHDL, LDLDIRECT in the last 72 hours. Thyroid Function Tests No results for input(s): TSH, T4TOTAL, T3FREE, THYROIDAB in the last 72 hours.  Invalid input(s): FREET3  Other results:   Imaging    [place] EKG SITE RITE  Result Date: 03/02/2020 If Site Rite image not attached, placement could not be confirmed due to current cardiac rhythm.     Medications:     Scheduled Medications: . carvedilol  3.125 mg Oral BID WC  . Chlorhexidine Gluconate Cloth  6 each Topical Daily  .  digoxin  0.125 mg Oral Daily  . fluticasone  2 spray Each Nare Daily  . heparin injection (subcutaneous)  5,000 Units Subcutaneous Q8H  . ivabradine  5 mg Oral BID WC  . loratadine  10 mg Oral Daily  . pantoprazole  40 mg Oral Daily  . pneumococcal 23 valent vaccine  0.5 mL Intramuscular Tomorrow-1000  . sacubitril-valsartan  1 tablet Oral BID  . sodium chloride flush  10-40 mL Intracatheter Q12H  . sodium chloride flush  3 mL Intravenous Q12H  . spironolactone  25 mg Oral Daily     Infusions: . sodium chloride    . furosemide (LASIX) infusion 12 mg/hr (03/02/20 1319)  . magnesium sulfate bolus IVPB       PRN Medications:  sodium chloride, acetaminophen, benzonatate,  guaiFENesin-dextromethorphan, ondansetron (ZOFRAN) IV, polyethylene glycol, sodium chloride flush, sodium chloride flush   Assessment/Plan   1. Acute on chronic systolic CHF: Known cardiomyopathy since 2019.  Echo this admission with EF < 20%, moderate LV dilation, severely decreased RV function with severe RV dilation, severe biatrial enlargement, mild MR. No history of ETOH/drugs.  No FH of cardiomyopathy (mother with [place]).  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  Suspect he is too large for cardiac MRI.  Narrow QRS on ECG so not CRT candidate.  Excellent diuresis yesterday with Lasix gtt + metolazone.  CVP 13-14, co-ox 65%.   - Continue Lasix gtt 12 mg/hr and will give metolazone 2.5 x 1.  Replace K (pending BMET).  - Continue low dose Coreg 3.125 mg bid for now.  - Ivabradine 5 mg bid.  - Continue digoxin 0.125 daily.  - Continue current Entresto and spironolactone.  - Unna boots.  - Repeat ANA, ?strongly positive.  - Will need right/left heart cath when CHF is better-controlled => aim for tomorrow.  Discussed risks/benefits with patient, he agrees to procedure.  2. OSA: Strongly suspect.  Needs sleep study as outpatient.  3. Diabetes: Eventual SGLT2 inhibitor.   Length of Stay: 6  [name], MD  03/03/2020, 8:44 AM  Advanced Heart Failure Team Pager [phone] (M-F; 7a - 4p)  Please contact [facility] for night-coverage after hours (4p -7a ) and weekends on [email or web address]

## 2020-03-03 NOTE — Plan of Care (Signed)

## 2020-03-03 NOTE — Progress Notes (Signed)
PROGRESS NOTE  Phillip [name]  DOB: Feb 18, 1986  PCP: Patient, No Pcp Per WHQ:[id number]  DOA: 02/26/2020  LOS: 6 days   Chief Complaint  Patient presents with  . Cough  . Shortness of Breath   Brief narrative: Patient is a 34 year old male with past medical history of biventricular congestive heart failure, ejection fraction 20%, obesity class III, likely undiagnosed obstructive sleep apnea, GERD and hidradenitis suppurativa.  Patient presented to the ED on 8/3 with worsening dyspnea on minimal exertion, orthopnea and edema.  Apparently, patient had been seen at [facility] system was being managed for non ischemic cardiomyopathy with biventricular failure.  Patient has not had ischemic work-up due to significant orthopnea.  He is under consideration for ICD.   In the ED, he was tachypneic to 30s.  He had bilateral lower extremity pedal edema. Chest x-ray showed diffuse hazy opacification bilaterally suggestive of vascular congestion. Patient was admitted under hospitalist service for CHF exacerbation. Cardiology consultation was obtained.   Patient was admitted and started on IV diuretics.  Cardiology team was consulted to assist with patient's management.  Heart failure team has been consulted, and they are directing care.  Patient is not on Lasix drip.  Patient is also on Aldactone, Entresto, digoxin, Coreg and ivabradine.  Patient received metolazone briefly.  The plan is to proceed with cardiac catheterization when patient is able to lie down flat.  Subjective: Patient was seen and examined this afternoon. [name] African-American male.  Sitting up at the edge of the bed.  Taking snacks. Not in physical distress. On room air.  Assessment/Plan: Acute on chronic biventricular heart failure Acute hypoxemic respiratory failure -Presented with tachypnea, bilateral pedal edema and pulmonary edema -Last known EF 20 to 25%. -Cardiac consult appreciated.  Currently on Lasix  drip. -Noted in ultimate plan for ischemia work-up when patient is able to lie down flat. -Continue to monitor intake, output and daily weight.  Hypokalemia/ hypomagnesemia: -Potassium and magnesium level being monitored while on Lasix drip. -Potassium level 3.2 today.  Noted oral replacement ordered.  HTN. Stable blood pressure tolerating well diuresis  New onset diabetes mellitus -A1c 6.8. -Diet control and exercise recommended.  GERD. Continue with pantoprazole.   Morbid obesity - Body mass index is 61.5 kg/m. Patient has been advised to make an attempt to improve diet and exercise patterns to aid in weight loss.  Mobility: Encourage ambulation Code Status:   Code Status: Full Code  Nutritional status: Body mass index is 61.5 kg/m.     Diet Order            Diet NPO time specified Except for: Sips with Meds  Diet effective midnight           Diet Heart Room service appropriate? Yes; Fluid consistency: Thin  Diet effective now                 DVT prophylaxis: heparin injection 5,000 Units Start: 02/28/20 0600   Antimicrobials:  None Fluid: None  Consultants: Cardiology Family Communication:  Girlfriend at bedside  Status is: Inpatient  Remains inpatient appropriate because:Ongoing diagnostic testing needed not appropriate for outpatient work up, Unsafe d/c plan and IV treatments appropriate due to intensity of illness or inability to take PO   Dispo: The patient is from: Home              Anticipated d/c is to: Home              Anticipated d/c date  is: 3 days              Patient currently is not medically stable to d/c.  Infusions:  . sodium chloride    . sodium chloride    . [START ON 03/04/2020] sodium chloride    . furosemide (LASIX) infusion 12 mg/hr (03/03/20 1111)    Scheduled Meds: . aspirin  81 mg Oral Pre-Cath  . carvedilol  3.125 mg Oral BID WC  . Chlorhexidine Gluconate Cloth  6 each Topical Daily  . digoxin  0.125 mg Oral Daily  .  fluticasone  2 spray Each Nare Daily  . heparin injection (subcutaneous)  5,000 Units Subcutaneous Q8H  . ivabradine  5 mg Oral BID WC  . loratadine  10 mg Oral Daily  . pantoprazole  40 mg Oral Daily  . pneumococcal 23 valent vaccine  0.5 mL Intramuscular Tomorrow-1000  . potassium chloride  40 mEq Oral BID  . sacubitril-valsartan  1 tablet Oral BID  . sodium chloride flush  10-40 mL Intracatheter Q12H  . sodium chloride flush  3 mL Intravenous Q12H  . sodium chloride flush  3 mL Intravenous Q12H  . spironolactone  25 mg Oral Daily    Antimicrobials: Anti-infectives (From admission, onward)   None      PRN meds: sodium chloride, sodium chloride, acetaminophen, benzonatate, guaiFENesin-dextromethorphan, ondansetron (ZOFRAN) IV, polyethylene glycol, sodium chloride flush, sodium chloride flush, sodium chloride flush, traMADol   Objective: Vitals:   03/03/20 0308 03/03/20 1121  BP: 115/80 (!) 102/52  Pulse: 96   Resp: 18 17  Temp: 98.4 F (36.9 C) 98.5 F (36.9 C)  SpO2: 92%     Intake/Output Summary (Last 24 hours) at 03/03/2020 1425 Last data filed at 03/03/2020 1124 Gross per 24 hour  Intake 1137.79 ml  Output 9900 ml  Net -8762.21 ml   Filed Weights   03/01/20 0328 03/02/20 0232 03/03/20 0308  Weight: (!) 210.7 kg (!) 208.3 kg (!) 205.7 kg   Weight change: -2.6 kg Body mass index is 61.5 kg/m.   Physical Exam: General exam: Appears calm and comfortable.  Morbidly obese.  Not in physical distress Skin: No rashes, lesions or ulcers. HEENT: Atraumatic, normocephalic, supple neck, no obvious bleeding Lungs: Clear to auscultate bilaterally CVS: Regular rate and rhythm with no murmur GI/Abd soft, nontender, distended from obesity, bowel sound present CNS: Alert, awake, oriented x3 Psychiatry: Mood appropriate Extremities: He has bilateral pedal edema on Ace wraps.  Data Review: I have personally reviewed the laboratory data and studies available.  Recent Labs   Lab 02/26/20 1701 02/27/20 0134  WBC 8.6 10.5  NEUTROABS 6.2 7.9*  HGB 12.7* 13.1  HCT 40.8 41.1  MCV 91.5 90.5  PLT 313 337   Recent Labs  Lab 02/26/20 1701 02/27/20 0108 02/27/20 0134 02/27/20 0134 02/28/20 0351 02/29/20 0225 03/01/20 0133 03/02/20 0213 03/03/20 0050 03/03/20 0817  NA   < >  --  138   < > 137 135 137 135  --  136  K   < >  --  3.2*   < > 4.1 4.0 3.6 3.4*  --  3.2*  CL   < >  --  96*   < > 100 97* 97* 96*  --  92*  CO2   < >  --  28   < > 27 26 27 28   --  32  GLUCOSE   < >  --  144*   < > 137* 136* 130*  139*  --  167*  BUN   < >  --  8   < > 11 10 11 7   --  8  CREATININE   < >  --  1.09   < > 1.17 1.08 1.08 1.01  --  1.05  CALCIUM   < >  --  8.9   < > 9.0 9.0 9.3 8.5*  --  8.6*  MG  --  1.6* 1.7  --   --   --   --   --  1.9  --    < > = values in this interval not displayed.   Lab Results  Component Value Date   HGBA1C 6.8 (H) 02/27/2020    No results found for: CHOL, TRIG, HDL, CHOLHDL, VLDL, LDLCALC, LDLDIRECT, LABVLDL  Signed, Terrilee Croak, MD [facility] Pager: [phone] (Secure Chat preferred). 03/03/2020

## 2020-03-03 NOTE — H&P (View-Only) (Signed)
Patient ID: Phillip [name], male   DOB: 07-Feb-1986, 34 y.o.   MRN: [id number]     Advanced Heart Failure Rounding Note  PCP-Cardiologist: No primary care provider on file.   Subjective:    Excellent diuresis yesterday, weight down 6 lbs.  Still tachypneic and more short of breath than baseline.   CVP 13-14, co-ox 65%.    Objective:   Weight Range: (!) 205.7 kg Body mass index is 61.5 kg/m.   Vital Signs:   Temp:  [97.5 F (36.4 C)-98.4 F (36.9 C)] 98.4 F (36.9 C) (08/09 0308) Pulse Rate:  [96-104] 96 (08/09 0308) Resp:  [18-20] 18 (08/09 0308) BP: (104-115)/(53-85) 115/80 (08/09 0308) SpO2:  [92 %-99 %] 92 % (08/09 0308) Weight:  [205.7 kg] 205.7 kg (08/09 0308) Last BM Date: 03/02/20  Weight change: Filed Weights   03/01/20 0328 03/02/20 0232 03/03/20 0308  Weight: (!) 210.7 kg (!) 208.3 kg (!) 205.7 kg    Intake/Output:   Intake/Output Summary (Last 24 hours) at 03/03/2020 0844 Last data filed at 03/03/2020 0800 Gross per 24 hour  Intake 1920.79 ml  Output 9325 ml  Net -7404.21 ml      Physical Exam    General:  Well appearing. No resp difficulty HEENT: Normal Neck: Supple. JVP 12 cm. Carotids 2+ bilat; no bruits. No lymphadenopathy or thyromegaly appreciated. Cor: PMI nondisplaced. Regular rate & rhythm. No rubs, gallops or murmurs. Lungs: Clear Abdomen: Soft, nontender, nondistended. No hepatosplenomegaly. No bruits or masses. Good bowel sounds. Extremities: No cyanosis, clubbing, rash.  1+ edema to knees.  Neuro: Alert & orientedx3, cranial nerves grossly intact. moves all 4 extremities w/o difficulty. Affect pleasant   Telemetry   NSR 90s (personally reviewed)  Labs    CBC No results for input(s): WBC, NEUTROABS, HGB, HCT, MCV, PLT in the last 72 hours. Basic Metabolic Panel Recent Labs    03/01/20 0133 03/02/20 0213 03/03/20 0050  NA 137 135  --   K 3.6 3.4*  --   CL 97* 96*  --   CO2 27 28  --   GLUCOSE 130* 139*  --   BUN 11 7  --    CREATININE 1.08 1.01  --   CALCIUM 9.3 8.5*  --   MG  --   --  1.9   Liver Function Tests No results for input(s): AST, ALT, ALKPHOS, BILITOT, PROT, ALBUMIN in the last 72 hours. No results for input(s): LIPASE, AMYLASE in the last 72 hours. Cardiac Enzymes No results for input(s): CKTOTAL, CKMB, CKMBINDEX, TROPONINI in the last 72 hours.  BNP: BNP (last 3 results) Recent Labs    02/26/20 1730  BNP 368.5*    ProBNP (last 3 results) No results for input(s): PROBNP in the last 8760 hours.   D-Dimer No results for input(s): DDIMER in the last 72 hours. Hemoglobin A1C No results for input(s): HGBA1C in the last 72 hours. Fasting Lipid Panel No results for input(s): CHOL, HDL, LDLCALC, TRIG, CHOLHDL, LDLDIRECT in the last 72 hours. Thyroid Function Tests No results for input(s): TSH, T4TOTAL, T3FREE, THYROIDAB in the last 72 hours.  Invalid input(s): FREET3  Other results:   Imaging    [place] EKG SITE RITE  Result Date: 03/02/2020 If Site Rite image not attached, placement could not be confirmed due to current cardiac rhythm.     Medications:     Scheduled Medications:  carvedilol  3.125 mg Oral BID WC   Chlorhexidine Gluconate Cloth  6 each Topical Daily  digoxin  0.125 mg Oral Daily   fluticasone  2 spray Each Nare Daily   heparin injection (subcutaneous)  5,000 Units Subcutaneous Q8H   ivabradine  5 mg Oral BID WC   loratadine  10 mg Oral Daily   pantoprazole  40 mg Oral Daily   pneumococcal 23 valent vaccine  0.5 mL Intramuscular Tomorrow-1000   sacubitril-valsartan  1 tablet Oral BID   sodium chloride flush  10-40 mL Intracatheter Q12H   sodium chloride flush  3 mL Intravenous Q12H   spironolactone  25 mg Oral Daily     Infusions:  sodium chloride     furosemide (LASIX) infusion 12 mg/hr (03/02/20 1319)   magnesium sulfate bolus IVPB       PRN Medications:  sodium chloride, acetaminophen, benzonatate,  guaiFENesin-dextromethorphan, ondansetron (ZOFRAN) IV, polyethylene glycol, sodium chloride flush, sodium chloride flush   Assessment/Plan   1. Acute on chronic systolic CHF: Known cardiomyopathy since 2019.  Echo this admission with EF < 20%, moderate LV dilation, severely decreased RV function with severe RV dilation, severe biatrial enlargement, mild MR. No history of ETOH/drugs.  No FH of cardiomyopathy (mother with [place]).  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  Suspect he is too large for cardiac MRI.  Narrow QRS on ECG so not CRT candidate.  Excellent diuresis yesterday with Lasix gtt + metolazone.  CVP 13-14, co-ox 65%.   - Continue Lasix gtt 12 mg/hr and will give metolazone 2.5 x 1.  Replace K (pending BMET).  - Continue low dose Coreg 3.125 mg bid for now.  - Ivabradine 5 mg bid.  - Continue digoxin 0.125 daily.  - Continue current Entresto and spironolactone.  - Unna boots.  - Repeat ANA, ?strongly positive.  - Will need right/left heart cath when CHF is better-controlled => aim for tomorrow.  Discussed risks/benefits with patient, he agrees to procedure.  2. OSA: Strongly suspect.  Needs sleep study as outpatient.  3. Diabetes: Eventual SGLT2 inhibitor.   Length of Stay: 6  [name], MD  03/03/2020, 8:44 AM  Advanced Heart Failure Team Pager [phone] (M-F; 7a - 4p)  Please contact [facility] for night-coverage after hours (4p -7a ) and weekends on [email or web address]

## 2020-03-03 NOTE — Progress Notes (Signed)
Pt not in need of bipap at this time. RT will continue to monitor as needed.

## 2020-03-03 NOTE — Progress Notes (Signed)
RN to called to pt room for "leaking" from IV site. Upon evaluation and after discussion with patient, pt admitted that tubing came loose while he was sleep and he tried to clean it up and reconnect. Educated pt on importance of not manipulating central line and asking for assistance; educated on risk of bleeding, clots and infection. Pt voiced understanding. Also reinforced the importance of fluid restrictions after pt was seen getting water from the faucet.

## 2020-03-04 ENCOUNTER — Encounter (HOSPITAL_COMMUNITY)
Admission: EM | Disposition: A | Discharge status: Home / Self Care | Payer: Self-pay | Source: Home / Self Care | Attending: Internal Medicine

## 2020-03-04 ENCOUNTER — Encounter: Payer: Self-pay | Admitting: *Deleted

## 2020-03-04 DIAGNOSIS — Z006 Encounter for examination for normal comparison and control in clinical research program: Secondary | ICD-10-CM

## 2020-03-04 DIAGNOSIS — I429 Cardiomyopathy, unspecified: Secondary | ICD-10-CM

## 2020-03-04 DIAGNOSIS — I509 Heart failure, unspecified: Secondary | ICD-10-CM

## 2020-03-04 DIAGNOSIS — E119 Type 2 diabetes mellitus without complications: Secondary | ICD-10-CM

## 2020-03-04 DIAGNOSIS — I5023 Acute on chronic systolic (congestive) heart failure: Secondary | ICD-10-CM | POA: Diagnosis not present

## 2020-03-04 HISTORY — PX: RIGHT/LEFT HEART CATH AND CORONARY ANGIOGRAPHY: CATH118266

## 2020-03-04 LAB — CBC
HCT: 42.9 % (ref 39.0–52.0)
Hemoglobin: 13.7 g/dL (ref 13.0–17.0)
MCH: 28.8 pg (ref 26.0–34.0)
MCHC: 31.9 g/dL (ref 30.0–36.0)
MCV: 90.1 fL (ref 80.0–100.0)
Platelets: 326 10*3/uL (ref 150–400)
RBC: 4.76 MIL/uL (ref 4.22–5.81)
RDW: 15.2 % (ref 11.5–15.5)
WBC: 8.9 10*3/uL (ref 4.0–10.5)
nRBC: 0 % (ref 0.0–0.2)

## 2020-03-04 LAB — BLOOD GAS, ARTERIAL
Acid-Base Excess: 11.9 mmol/L — ABNORMAL HIGH (ref 0.0–2.0)
Bicarbonate: 38.4 mmol/L — ABNORMAL HIGH (ref 20.0–28.0)
Drawn by: 137461
FIO2: 40
O2 Saturation: 95.5 %
Patient temperature: 37
pCO2 arterial: 78 mmHg (ref 32.0–48.0)
pH, Arterial: 7.313 — ABNORMAL LOW (ref 7.350–7.450)
pO2, Arterial: 91.3 mmHg (ref 83.0–108.0)

## 2020-03-04 LAB — BASIC METABOLIC PANEL
Anion gap: 12 (ref 5–15)
BUN: 17 mg/dL (ref 6–20)
CO2: 36 mmol/L — ABNORMAL HIGH (ref 22–32)
Calcium: 9.3 mg/dL (ref 8.9–10.3)
Chloride: 85 mmol/L — ABNORMAL LOW (ref 98–111)
Creatinine, Ser: 1.05 mg/dL (ref 0.61–1.24)
GFR calc Af Amer: >60 mL/min (ref >60)
GFR calc non Af Amer: >60 mL/min (ref >60)
Glucose, Bld: 127 mg/dL — ABNORMAL HIGH (ref 70–99)
Potassium: 4.1 mmol/L (ref 3.5–5.1)
Sodium: 133 mmol/L — ABNORMAL LOW (ref 135–145)

## 2020-03-04 LAB — POCT I-STAT EG7
Acid-Base Excess: 7 mmol/L — ABNORMAL HIGH (ref 0.0–2.0)
Bicarbonate: 38.6 mmol/L — ABNORMAL HIGH (ref 20.0–28.0)
Calcium, Ion: 1.03 mmol/L — ABNORMAL LOW (ref 1.15–1.40)
HCT: 43 % (ref 39.0–52.0)
Hemoglobin: 14.6 g/dL (ref 13.0–17.0)
O2 Saturation: 59 %
Potassium: 3.3 mmol/L — ABNORMAL LOW (ref 3.5–5.1)
Sodium: 129 mmol/L — ABNORMAL LOW (ref 135–145)
TCO2: 41 mmol/L — ABNORMAL HIGH (ref 22–32)
pCO2, Ven: 92.6 mmHg (ref 44.0–60.0)
pH, Ven: 7.228 — ABNORMAL LOW (ref 7.250–7.430)
pO2, Ven: 39 mmHg (ref 32.0–45.0)

## 2020-03-04 LAB — MAGNESIUM: Magnesium: 1.9 mg/dL (ref 1.7–2.4)

## 2020-03-04 LAB — COOXEMETRY PANEL
Carboxyhemoglobin: 1.5 % (ref 0.5–1.5)
Methemoglobin: 0.6 % (ref 0.0–1.5)
O2 Saturation: 71.4 %
Total hemoglobin: 13.6 g/dL (ref 12.0–16.0)

## 2020-03-04 LAB — ANA: Anti Nuclear Antibody (ANA): NEGATIVE

## 2020-03-04 SURGERY — RIGHT/LEFT HEART CATH AND CORONARY ANGIOGRAPHY
Anesthesia: LOCAL

## 2020-03-04 MED ORDER — IOHEXOL 350 MG/ML SOLN
INTRAVENOUS | Status: DC | PRN
  Administered 2020-03-04: 70 mL

## 2020-03-04 MED ORDER — ACETAZOLAMIDE 250 MG PO TABS
250.0000 mg | ORAL_TABLET | Freq: Every day | ORAL | Status: DC
  Administered 2020-03-04 – 2020-03-10 (×6): 250 mg via ORAL
  Filled 2020-03-04 (×7): qty 1

## 2020-03-04 MED ORDER — HEPARIN (PORCINE) IN NACL 1000-0.9 UT/500ML-% IV SOLN
INTRAVENOUS | Status: AC
  Filled 2020-03-04: qty 1000

## 2020-03-04 MED ORDER — MIDAZOLAM HCL 2 MG/2ML IJ SOLN
INTRAMUSCULAR | Status: DC | PRN
  Administered 2020-03-04: 1 mg via INTRAVENOUS

## 2020-03-04 MED ORDER — HYDRALAZINE HCL 20 MG/ML IJ SOLN: 10.0000 mg | INTRAMUSCULAR | Status: AC | PRN

## 2020-03-04 MED ORDER — FENTANYL CITRATE (PF) 100 MCG/2ML IJ SOLN
INTRAMUSCULAR | Status: DC | PRN
  Administered 2020-03-04: 25 ug via INTRAVENOUS

## 2020-03-04 MED ORDER — HEPARIN SODIUM (PORCINE) 1000 UNIT/ML IJ SOLN
INTRAMUSCULAR | Status: DC | PRN
  Administered 2020-03-04: 6000 [IU] via INTRAVENOUS

## 2020-03-04 MED ORDER — METOLAZONE 2.5 MG PO TABS
2.5000 mg | ORAL_TABLET | Freq: Once | ORAL | Status: AC
  Administered 2020-03-04: 2.5 mg via ORAL
  Filled 2020-03-04: qty 1

## 2020-03-04 MED ORDER — FENTANYL CITRATE (PF) 100 MCG/2ML IJ SOLN
INTRAMUSCULAR | Status: AC
  Filled 2020-03-04: qty 2

## 2020-03-04 MED ORDER — SODIUM CHLORIDE 0.9 % IV SOLN: 250.0000 mL | INTRAVENOUS | Status: DC | PRN

## 2020-03-04 MED ORDER — ACETAMINOPHEN 325 MG PO TABS
650.0000 mg | ORAL_TABLET | ORAL | Status: DC | PRN
  Administered 2020-03-06: 650 mg via ORAL
  Filled 2020-03-04: qty 2

## 2020-03-04 MED ORDER — ONDANSETRON HCL 4 MG/2ML IJ SOLN: 4.0000 mg | Freq: Four times a day (QID) | INTRAMUSCULAR | Status: DC | PRN

## 2020-03-04 MED ORDER — SODIUM CHLORIDE 0.9% FLUSH
3.0000 mL | INTRAVENOUS | Status: DC | PRN
  Administered 2020-03-11: 3 mL via INTRAVENOUS

## 2020-03-04 MED ORDER — HEPARIN SODIUM (PORCINE) 1000 UNIT/ML IJ SOLN
INTRAMUSCULAR | Status: AC
  Filled 2020-03-04: qty 1

## 2020-03-04 MED ORDER — LIDOCAINE HCL (PF) 1 % IJ SOLN
INTRAMUSCULAR | Status: AC
  Filled 2020-03-04: qty 30

## 2020-03-04 MED ORDER — LABETALOL HCL 5 MG/ML IV SOLN: 10.0000 mg | INTRAVENOUS | Status: AC | PRN

## 2020-03-04 MED ORDER — HEPARIN (PORCINE) IN NACL 1000-0.9 UT/500ML-% IV SOLN
INTRAVENOUS | Status: DC | PRN
  Administered 2020-03-04 (×2): 500 mL

## 2020-03-04 MED ORDER — HEPARIN SODIUM (PORCINE) 5000 UNIT/ML IJ SOLN
5000.0000 [IU] | Freq: Three times a day (TID) | INTRAMUSCULAR | Status: DC
  Administered 2020-03-05 – 2020-03-08 (×8): 5000 [IU] via SUBCUTANEOUS
  Filled 2020-03-04 (×14): qty 1

## 2020-03-04 MED ORDER — MIDAZOLAM HCL 2 MG/2ML IJ SOLN
INTRAMUSCULAR | Status: AC
  Filled 2020-03-04: qty 2

## 2020-03-04 MED ORDER — SODIUM CHLORIDE 0.9% FLUSH
3.0000 mL | Freq: Two times a day (BID) | INTRAVENOUS | Status: DC
  Administered 2020-03-06 – 2020-03-10 (×6): 3 mL via INTRAVENOUS

## 2020-03-04 MED ORDER — VERAPAMIL HCL 2.5 MG/ML IV SOLN
INTRAVENOUS | Status: AC
  Filled 2020-03-04: qty 2

## 2020-03-04 MED ORDER — VERAPAMIL HCL 2.5 MG/ML IV SOLN
INTRAVENOUS | Status: DC | PRN
  Administered 2020-03-04: 10 mL via INTRA_ARTERIAL

## 2020-03-04 MED ORDER — LIDOCAINE HCL (PF) 1 % IJ SOLN
INTRAMUSCULAR | Status: DC | PRN
  Administered 2020-03-04 (×2): 2 mL via INTRADERMAL

## 2020-03-04 SURGICAL SUPPLY — 11 items
CATH 5FR JL3.5 JR4 ANG PIG MP (CATHETERS) ×1 IMPLANT
CATH BALLN WEDGE 5F 110CM (CATHETERS) ×1 IMPLANT
DEVICE RAD COMP TR BAND LRG (VASCULAR PRODUCTS) ×1 IMPLANT
GLIDESHEATH SLEND SS 6F .021 (SHEATH) ×1 IMPLANT
GUIDEWIRE .025 260CM (WIRE) ×1 IMPLANT
GUIDEWIRE INQWIRE 1.5J.035X260 (WIRE) IMPLANT
INQWIRE 1.5J .035X260CM (WIRE) ×2
KIT HEART LEFT (KITS) ×2 IMPLANT
PACK CARDIAC CATHETERIZATION (CUSTOM PROCEDURE TRAY) ×2 IMPLANT
SHEATH GLIDE SLENDER 4/5FR (SHEATH) ×1 IMPLANT
TRANSDUCER W/STOPCOCK (MISCELLANEOUS) ×2 IMPLANT

## 2020-03-04 NOTE — Care Management (Signed)
Benefit check sent for entresto and corlinor

## 2020-03-04 NOTE — Progress Notes (Signed)
Patient is not wearing the bipap at this time and resting well. RT will continue to monitor and will put pt on bipap when he is ready.

## 2020-03-04 NOTE — Progress Notes (Signed)
Back from the cath lab awake and alert on O2 4 L [place] tolerating well. Right TR Band in placed, Dressing to left brachial in placed. Denied any discomfort. Instructed to elevate right arm with pillow and to avoid movement of the affected arm but non- compliant. Explained the chance of bleeding  If he kept moving the arm. Continue to monitor.

## 2020-03-04 NOTE — Progress Notes (Signed)
Pt transported to and from the cardiac cath lab on BiPAP without incident.  Upon returning to the patient's room post-cath, pt was taken off bipap and placed on 4 l/m [place].

## 2020-03-04 NOTE — Interval H&P Note (Signed)
History and Physical Interval Note:  03/04/2020 1:25 PM  Phillip Young  has presented today for surgery, with the diagnosis of heart failure.  The various methods of treatment have been discussed with the patient and family. After consideration of risks, benefits and other options for treatment, the patient has consented to  Procedure(s): RIGHT/LEFT HEART CATH AND CORONARY ANGIOGRAPHY (N/A) as a surgical intervention.  The patient's history has been reviewed, patient examined, no change in status, stable for surgery.  I have reviewed the patient's chart and labs.  Questions were answered to the patient's satisfaction.     Cloy Cozzens [employer]

## 2020-03-04 NOTE — TOC Benefit Eligibility Note (Signed)
Transition of Care [facility]) Benefit Eligibility Note    Patient Details  Name: Kendric Sindelar MRN: [id number] Date of Birth: 01-Nov-1985   Medication/Dose: Delene Loll 24-26 MG BID  Covered?: Yes  Tier: 3 Drug (PREFERRED)  Prescription Coverage Preferred Pharmacy: Roseanne Kaufman with Person/Company/Phone Number:: [facility]  @ [employer]  RX # [phone]  Co-Pay: Johnsie [employer]  Prior Approval: No     Additional Notes: IVABRADINE TABLET    5 MG  BID  ( CORLANOR  ) : COVER- YES  P/A-YES # [phone]    Memory Argue Phone Number: 03/04/2020, 4:18 PM

## 2020-03-04 NOTE — Progress Notes (Signed)
Patient was lethargic earlier around noon.  I obtained a blood gas. pH 7.31 with PCO2 elevated to 78. Seems to be in Cath Lab right now.  Will benefit from continuous BiPAP today and tonight. Repeat ABG in am. Discussed with cardiologist Dr. [name] and patient's RN.

## 2020-03-04 NOTE — Progress Notes (Signed)
Pt non compliant with post cath procedure of keeping affected arm elevated and immobilized. TR band still in place due to bleeding at puncture site. Will continue to monitor.

## 2020-03-04 NOTE — Research (Signed)
[place] Informed Consent                  Subject Name:   Phillip Young   Subject met inclusion and exclusion criteria.  The informed consent form, study requirements and expectations were reviewed with the subject and questions and concerns were addressed prior to the signing of the consent form.  The subject verbalized understanding of the trial requirements.  The subject agreed to participate in the [facility] trial and signed the informed consent.  The informed consent was obtained prior to performance of any protocol-specific procedures for the subject.  A copy of the signed informed consent was given to the subject and a copy was placed in the subject's medical record.   [place] Dariella Gillihan, Research Assistant  03/04/2020 08:19 a.m.

## 2020-03-04 NOTE — Progress Notes (Signed)
PROGRESS NOTE  Phillip Young  DOB: June 25, 1986  PCP: Patient, No Pcp Per ASN:[id number]  DOA: 02/26/2020  LOS: 7 days   Chief Complaint  Patient presents with  . Cough  . Shortness of Breath   Brief narrative: Patient is a 34 year old male with past medical history of biventricular congestive heart failure, ejection fraction 20%, obesity class III, likely undiagnosed obstructive sleep apnea, GERD and hidradenitis suppurativa.  Patient presented to the ED on 8/3 with worsening dyspnea on minimal exertion, orthopnea and edema.  Apparently, patient had been seen at [facility] system was being managed for non ischemic cardiomyopathy with biventricular failure.  Patient has not had ischemic work-up due to significant orthopnea.  He is under consideration for ICD.   In the ED, he was tachypneic to 30s.  He had bilateral lower extremity pedal edema. Chest x-ray showed diffuse hazy opacification bilaterally suggestive of vascular congestion. Patient was admitted under hospitalist service for CHF exacerbation. Cardiology consultation was obtained.  Patient was admitted and started on IV diuretics.  Cardiology team was consulted to assist with patient's management.  Heart failure team has been consulted, and they are directing care.  Patient is not on Lasix drip.  Patient is also on Aldactone, Entresto, digoxin, Coreg and ivabradine.  Patient received metolazone briefly.  The plan is to proceed with cardiac catheterization when patient is able to lie down flat.  Subjective: Patient was seen and examined this afternoon. He has been on BiPAP for last 30 minutes.  Per RN, he got short of breath, his oxygen saturation dropped to 70s and 80s.  After he was placed on BiPAP, saturation improved to 94.  Is currently able to open eyes on BiPAP. Labs from this morning noted.  Serum bicarb level has been trending up from 26 on admission to 36 today.  Could be secondary to Lasix drip.  May need to  rule out CO2 retention as well..  Assessment/Plan: Acute on chronic biventricular heart failure -Presented with tachypnea, bilateral pedal edema and pulmonary edema -Last known EF 20 to 25%. -Cardiac consult appreciated.  Currently on Lasix drip. -Noted net negative balance of 4 L in last 24 hours and 19 L since admission. -Noted in ultimate plan for ischemia work-up when patient is able to lie down flat.  -Continue to monitor intake, output and daily weight.  Acute hypoxemic respiratory failure Rule out CO2 retention -Respiratory failure since admission secondary to CHF exacerbation.  So far he has remained on 3 L oxygen by nasal cannula.  -However, this morning patient is lethargic and requiring BiPAP. -Serum bicarb level has been trending up from 26 on admission to 36 today.  Could be secondary to Lasix drip.  May need to rule out CO2 retention as well.. -Obtain stat blood gas.  He probably has underlying sleep apnea and would benefit from nightly BiPAP.   Hypokalemia/ hypomagnesemia: -Potassium and magnesium level being monitored while on Lasix drip.  HTN. Stable blood pressure tolerating well diuresis  New onset diabetes mellitus -A1c 6.8. -Diet control and exercise recommended.  GERD. Continue with pantoprazole.   Morbid obesity - Body mass index is 60.46 kg/m. Patient has been advised to make an attempt to improve diet and exercise patterns to aid in weight loss.  Mobility: Encourage ambulation Code Status:   Code Status: Full Code  Nutritional status: Body mass index is 60.46 kg/m.     Diet Order            Diet NPO  time specified Except for: Sips with Meds  Diet effective midnight                 DVT prophylaxis: heparin injection 5,000 Units Start: 02/28/20 0600   Antimicrobials:  None Fluid: None  Consultants: Cardiology Family Communication:  None at bedside.  Status is: Inpatient  Remains inpatient appropriate because:Ongoing diagnostic testing  needed not appropriate for outpatient work up, Unsafe d/c plan and IV treatments appropriate due to intensity of illness or inability to take PO   Dispo: The patient is from: Home              Anticipated d/c is to: Home              Anticipated d/c date is: More than 3 days              Patient currently is not medically stable to d/c.  Infusions:  . sodium chloride    . sodium chloride    . sodium chloride    . furosemide (LASIX) infusion 12 mg/hr (03/04/20 0944)    Scheduled Meds: . carvedilol  3.125 mg Oral BID WC  . Chlorhexidine Gluconate Cloth  6 each Topical Daily  . digoxin  0.125 mg Oral Daily  . fluticasone  2 spray Each Nare Daily  . heparin injection (subcutaneous)  5,000 Units Subcutaneous Q8H  . ivabradine  5 mg Oral BID WC  . loratadine  10 mg Oral Daily  . pantoprazole  40 mg Oral Daily  . pneumococcal 23 valent vaccine  0.5 mL Intramuscular Tomorrow-1000  . sacubitril-valsartan  1 tablet Oral BID  . sodium chloride flush  10-40 mL Intracatheter Q12H  . sodium chloride flush  3 mL Intravenous Q12H  . sodium chloride flush  3 mL Intravenous Q12H  . spironolactone  25 mg Oral Daily    Antimicrobials: Anti-infectives (From admission, onward)   None      PRN meds: sodium chloride, sodium chloride, acetaminophen, benzonatate, guaiFENesin-dextromethorphan, ondansetron (ZOFRAN) IV, polyethylene glycol, sodium chloride flush, sodium chloride flush, sodium chloride flush, traMADol   Objective: Vitals:   03/03/20 2315 03/04/20 1156  BP: 113/62   Pulse:  87  Resp: 20   Temp: 98.4 F (36.9 C)   SpO2: 96%     Intake/Output Summary (Last 24 hours) at 03/04/2020 1227 Last data filed at 03/04/2020 1200 Gross per 24 hour  Intake 1512.48 ml  Output 4450 ml  Net -2937.52 ml   Filed Weights   03/02/20 0232 03/03/20 0308 03/04/20 0500  Weight: (!) 208.3 kg (!) 205.7 kg (!) 202.2 kg   Weight change: -3.5 kg Body mass index is 60.46 kg/m.   Physical  Exam: General exam: Appears calm and comfortable.  Morbidly obese.  Lethargic on BiPAP  skin: No rashes, lesions or ulcers. HEENT: Atraumatic, normocephalic, supple neck, no obvious bleeding Lungs: Clear to auscultation bilaterally CVS: Regular rate and rhythm with no murmur GI/Abd soft, nontender, distended from obesity, bowel sound present CNS: Opens eyes on verbal command.  On BiPAP. Psychiatry: Unable to examine Extremities: He has bilateral pedal edema on Ace wraps.  Data Review: I have personally reviewed the laboratory data and studies available.  Recent Labs  Lab 02/26/20 1701 02/27/20 0134 03/04/20 0553  WBC 8.6 10.5 8.9  NEUTROABS 6.2 7.9*  --   HGB 12.7* 13.1 13.7  HCT 40.8 41.1 42.9  MCV 91.5 90.5 90.1  PLT 313 337 326   Recent Labs  Lab 02/26/20 1701 02/27/20  3762 02/27/20 0134 02/28/20 0351 02/29/20 0225 03/01/20 0133 03/02/20 0213 03/03/20 0050 03/03/20 0817 03/04/20 0553  NA   < >  --  138   < > 135 137 135  --  136 133*  K   < >  --  3.2*   < > 4.0 3.6 3.4*  --  3.2* 4.1  CL   < >  --  96*   < > 97* 97* 96*  --  92* 85*  CO2   < >  --  28   < > 26 27 28   --  32 36*  GLUCOSE   < >  --  144*   < > 136* 130* 139*  --  167* 127*  BUN   < >  --  8   < > 10 11 7   --  8 17  CREATININE   < >  --  1.09   < > 1.08 1.08 1.01  --  1.05 1.05  CALCIUM   < >  --  8.9   < > 9.0 9.3 8.5*  --  8.6* 9.3  MG  --  1.6* 1.7  --   --   --   --  1.9  --  1.9   < > = values in this interval not displayed.   Lab Results  Component Value Date   HGBA1C 6.8 (H) 02/27/2020    No results found for: CHOL, TRIG, HDL, CHOLHDL, VLDL, LDLCALC, LDLDIRECT, LABVLDL  Signed, Terrilee Croak, MD [facility] Pager: [phone] (Secure Chat preferred). 03/04/2020

## 2020-03-04 NOTE — Progress Notes (Signed)
Patient does not want to go on BIPAP yet, he said he is doing fine and he will let the nurse know if he needs RT to come in for the BIPAP. RT will continue to monitor.

## 2020-03-04 NOTE — Progress Notes (Signed)
Became lethargic but arousable, Desats to 70's-80's on 5 L [place] Placed on Bipap at 40% FIo2 saturation went up to 94 . PA made aware with no order,  continue to monitor.

## 2020-03-04 NOTE — Progress Notes (Signed)
Bleeding noted on TR Band site. Inflated with air, total 12 ml. Continue to monitor. Non-compliant with vital signs taking would always take out the BP cuff and not elevating right arm with pillow and always moving affected site. Continue to monitor.

## 2020-03-04 NOTE — Progress Notes (Addendum)
Patient ID: Ranen Doolin, male   DOB: [birth year], 34 y.o.   MRN: [id number]     Advanced Heart Failure Rounding Note  PCP-Cardiologist: No primary care provider on file.   Subjective:    Continues to diurese well. - 5.9L out yesterday. Wt down an additional 8 lb. Unable to get CVP reading this am (RN working to troubleshoot)  SCr and K stable.   Feeling better. Breathing improved. No CP. Poor sleep last night.   Awaiting R/LHC today.    Objective:   Weight Range: (!) 202.2 kg Body mass index is 60.46 kg/m.   Vital Signs:   Temp:  [98.4 F (36.9 C)-98.5 F (36.9 C)] 98.4 F (36.9 C) (08/09 2315) Pulse Rate:  [86-87] 87 (08/09 2010) Resp:  [15-24] 20 (08/09 2315) BP: (102-117)/(52-68) 113/62 (08/09 2315) SpO2:  [96 %-100 %] 96 % (08/09 2315) Weight:  [202.2 kg] 202.2 kg (08/10 0500) Last BM Date: 03/03/20  Weight change: Filed Weights   03/02/20 0232 03/03/20 0308 03/04/20 0500  Weight: (!) 208.3 kg (!) 205.7 kg (!) 202.2 kg    Intake/Output:   Intake/Output Summary (Last 24 hours) at 03/04/2020 0907 Last data filed at 03/04/2020 0700 Gross per 24 hour  Intake 1455.48 ml  Output 5375 ml  Net -3919.52 ml      Physical Exam    General:  Super morbidly obese young male, sitting up on side of bed. No resp difficulty HEENT: Normal Neck: Supple. Thick neck, JVD not well visualized Carotids 2+ bilat; no bruits. No lymphadenopathy or thyromegaly appreciated. Cor: PMI nondisplaced. Regular rate & rhythm. No rubs, gallops or murmurs. Lungs: distant breath sounds likely 2/2 body habitus  Abdomen: obese, Soft, nontender, nondistended. No hepatosplenomegaly. No bruits or masses. Good bowel sounds. Extremities: No cyanosis, clubbing, rash.  1+ edema to knees. + bilateral unna boots  Neuro: Alert & orientedx3, cranial nerves grossly intact. moves all 4 extremities w/o difficulty. Affect pleasant   Telemetry   NSR 80s- 90s (personally reviewed)  Labs    CBC Recent  Labs    03/04/20 0553  WBC 8.9  HGB 13.7  HCT 42.9  MCV 90.1  PLT 229   Basic Metabolic Panel Recent Labs    03/02/20 0213 03/03/20 0050 03/03/20 0817 03/04/20 0553  NA   < >  --  136 133*  K   < >  --  3.2* 4.1  CL   < >  --  92* 85*  CO2   < >  --  32 36*  GLUCOSE   < >  --  167* 127*  BUN   < >  --  8 17  CREATININE   < >  --  1.05 1.05  CALCIUM   < >  --  8.6* 9.3  MG  --  1.9  --  1.9   < > = values in this interval not displayed.   Liver Function Tests No results for input(s): AST, ALT, ALKPHOS, BILITOT, PROT, ALBUMIN in the last 72 hours. No results for input(s): LIPASE, AMYLASE in the last 72 hours. Cardiac Enzymes No results for input(s): CKTOTAL, CKMB, CKMBINDEX, TROPONINI in the last 72 hours.  BNP: BNP (last 3 results) Recent Labs    02/26/20 1730  BNP 368.5*    ProBNP (last 3 results) No results for input(s): PROBNP in the last 8760 hours.   D-Dimer No results for input(s): DDIMER in the last 72 hours. Hemoglobin A1C No results for input(s): HGBA1C in  the last 72 hours. Fasting Lipid Panel No results for input(s): CHOL, HDL, LDLCALC, TRIG, CHOLHDL, LDLDIRECT in the last 72 hours. Thyroid Function Tests No results for input(s): TSH, T4TOTAL, T3FREE, THYROIDAB in the last 72 hours.  Invalid input(s): FREET3  Other results:   Imaging    No results found.   Medications:     Scheduled Medications: . carvedilol  3.125 mg Oral BID WC  . Chlorhexidine Gluconate Cloth  6 each Topical Daily  . digoxin  0.125 mg Oral Daily  . fluticasone  2 spray Each Nare Daily  . heparin injection (subcutaneous)  5,000 Units Subcutaneous Q8H  . ivabradine  5 mg Oral BID WC  . loratadine  10 mg Oral Daily  . pantoprazole  40 mg Oral Daily  . pneumococcal 23 valent vaccine  0.5 mL Intramuscular Tomorrow-1000  . potassium chloride  40 mEq Oral BID  . sacubitril-valsartan  1 tablet Oral BID  . sodium chloride flush  10-40 mL Intracatheter Q12H  . sodium  chloride flush  3 mL Intravenous Q12H  . sodium chloride flush  3 mL Intravenous Q12H  . spironolactone  25 mg Oral Daily    Infusions: . sodium chloride    . sodium chloride    . sodium chloride    . furosemide (LASIX) infusion 12 mg/hr (03/03/20 1800)    PRN Medications: sodium chloride, sodium chloride, acetaminophen, benzonatate, guaiFENesin-dextromethorphan, ondansetron (ZOFRAN) IV, polyethylene glycol, sodium chloride flush, sodium chloride flush, sodium chloride flush, traMADol   Assessment/Plan   1. Acute on chronic systolic CHF: Known cardiomyopathy since 2019.  Echo this admission with EF < 20%, moderate LV dilation, severely decreased RV function with severe RV dilation, severe biatrial enlargement, mild MR. No history of ETOH/drugs.  No FH of cardiomyopathy (mother with [place]).  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  Suspect he is too large for cardiac MRI.  Narrow QRS on ECG so not CRT candidate.  Excellent diuresis yesterday with Lasix gtt + metolazone, -5.9L. Wt down additional 8 lb.  Continues w/ volume overload. SCr/K stable. Co-ox pending - Continue Lasix gtt 12 mg/hr.  - Continue low dose Coreg 3.125 mg bid for now.  - Ivabradine 5 mg bid.  - Continue digoxin 0.125 daily.  - Continue current Entresto and spironolactone.  - Unna boots.  - Repeat ANA pending, ?strongly positive.  - Plan R/LHC today.  2. OSA: Strongly suspect.  Needs sleep study as outpatient.  3. Diabetes: Eventual SGLT2 inhibitor.   Length of Stay: [address], PA-C  03/04/2020, 9:07 AM  Advanced Heart Failure Team Pager [phone] (M-F; 7a - 4p)  Please contact [facility] for night-coverage after hours (4p -7a ) and weekends on [email or web address]  Patient seen with PA, agree with the above note.    Hypercarbic respiratory failure noted today and patient was put on Bipap.  Good diuresis again yesterday on Lasix gtt +  metolazone, weight coming down.   RHC/LHC: Coronary Findings  Diagnostic Dominance: Right Left Main  No significant disease.  Left Anterior Descending  Luminal irregularities.  Ramus Intermedius  Large, no significant disease.  Left Circumflex  Small, no significant disease.  Right Coronary Artery  No significant disease.  Intervention  No interventions have been documented. Right Heart  Right Heart Pressures RHC Procedural Findings: Hemodynamics (mmHg) RA mean 19 RV 67/23 PA 71/20, mean 39 PCWP mean 23 LV 97/29 AO 95/60  Oxygen saturations: PA 61% AO 95%  Cardiac Output (Fick) 6.34  Cardiac Index (Fick) 2.1 PVR 2.5 WU   General: NAD, Bipap Neck: JVP 14-16 cm, no thyromegaly or thyroid nodule.  Lungs: Clear to auscultation bilaterally with normal respiratory effort. CV: Nonpalpable PMI.  Heart regular S1/S2, no S3/S4, no murmur.  1+ edema to thighs. Abdomen: Soft, nontender, no hepatosplenomegaly, no distention.  Skin: Intact without lesions or rashes.  Neurologic: Alert and oriented x 3.  Psych: Normal affect. Extremities: No clubbing or cyanosis.  HEENT: Normal.   Patient still has significant volume overload based on cath, cardiac output low but not markedly low.  He also has pulmonary venous hypertension.  - No BP room to titrate his meds today.  - Continue Lasix gtt 12 mg/hr and add metolazone 2.5 x 1.  - Add acetazolamide 250 mg daily with rising HCO3.   Hypercarbic respiratory failure.  Would keep on Bipap for now, can give him a break later this evening then use qhs.  He will need formal sleep study eventually.   [name] 03/04/2020 4:12 PM

## 2020-03-05 ENCOUNTER — Encounter (HOSPITAL_COMMUNITY): Payer: Self-pay | Admitting: Cardiology

## 2020-03-05 DIAGNOSIS — I5023 Acute on chronic systolic (congestive) heart failure: Secondary | ICD-10-CM | POA: Diagnosis not present

## 2020-03-05 DIAGNOSIS — N179 Acute kidney failure, unspecified: Secondary | ICD-10-CM

## 2020-03-05 LAB — BASIC METABOLIC PANEL
Anion gap: 11 (ref 5–15)
Anion gap: 11 (ref 5–15)
BUN: 23 mg/dL — ABNORMAL HIGH (ref 6–20)
BUN: 25 mg/dL — ABNORMAL HIGH (ref 6–20)
CO2: 37 mmol/L — ABNORMAL HIGH (ref 22–32)
CO2: 35 mmol/L — ABNORMAL HIGH (ref 22–32)
Calcium: 8.8 mg/dL — ABNORMAL LOW (ref 8.9–10.3)
Calcium: 8.8 mg/dL — ABNORMAL LOW (ref 8.9–10.3)
Chloride: 83 mmol/L — ABNORMAL LOW (ref 98–111)
Chloride: 80 mmol/L — ABNORMAL LOW (ref 98–111)
Creatinine, Ser: 1.5 mg/dL — ABNORMAL HIGH (ref 0.61–1.24)
Creatinine, Ser: 1.49 mg/dL — ABNORMAL HIGH (ref 0.61–1.24)
GFR calc Af Amer: >60 mL/min (ref >60)
GFR calc Af Amer: >60 mL/min (ref >60)
GFR calc non Af Amer: 59 mL/min — ABNORMAL LOW (ref >60)
GFR calc non Af Amer: 60 mL/min — ABNORMAL LOW (ref >60)
Glucose, Bld: 122 mg/dL — ABNORMAL HIGH (ref 70–99)
Glucose, Bld: 305 mg/dL — ABNORMAL HIGH (ref 70–99)
Potassium: 4 mmol/L (ref 3.5–5.1)
Potassium: 3.4 mmol/L — ABNORMAL LOW (ref 3.5–5.1)
Sodium: 131 mmol/L — ABNORMAL LOW (ref 135–145)
Sodium: 126 mmol/L — ABNORMAL LOW (ref 135–145)

## 2020-03-05 LAB — BLOOD GAS, ARTERIAL
Acid-Base Excess: 11.9 mmol/L — ABNORMAL HIGH (ref 0.0–2.0)
Acid-Base Excess: 12.7 mmol/L — ABNORMAL HIGH (ref 0.0–2.0)
Bicarbonate: 38 mmol/L — ABNORMAL HIGH (ref 20.0–28.0)
Bicarbonate: 38.9 mmol/L — ABNORMAL HIGH (ref 20.0–28.0)
Drawn by: 59133
Drawn by: 336832
FIO2: 36
FIO2: 36
O2 Saturation: 93.5 %
O2 Saturation: 92.5 %
Patient temperature: 37.4
Patient temperature: 36.7
pCO2 arterial: 74.7 mmHg (ref 32.0–48.0)
pCO2 arterial: 73.1 mmHg (ref 32.0–48.0)
pH, Arterial: 7.33 — ABNORMAL LOW (ref 7.350–7.450)
pH, Arterial: 7.344 — ABNORMAL LOW (ref 7.350–7.450)
pO2, Arterial: 75.6 mmHg — ABNORMAL LOW (ref 83.0–108.0)
pO2, Arterial: 69.8 mmHg — ABNORMAL LOW (ref 83.0–108.0)

## 2020-03-05 LAB — POCT I-STAT EG7
Acid-Base Excess: 12 mmol/L — ABNORMAL HIGH (ref 0.0–2.0)
Bicarbonate: 43.9 mmol/L — ABNORMAL HIGH (ref 20.0–28.0)
Calcium, Ion: 1.16 mmol/L (ref 1.15–1.40)
HCT: 46 % (ref 39.0–52.0)
Hemoglobin: 15.6 g/dL (ref 13.0–17.0)
O2 Saturation: 63 %
Potassium: 3.7 mmol/L (ref 3.5–5.1)
Sodium: 134 mmol/L — ABNORMAL LOW (ref 135–145)
TCO2: 47 mmol/L — ABNORMAL HIGH (ref 22–32)
pCO2, Ven: 93.6 mmHg (ref 44.0–60.0)
pH, Ven: 7.28 (ref 7.250–7.430)
pO2, Ven: 39 mmHg (ref 32.0–45.0)

## 2020-03-05 LAB — COOXEMETRY PANEL
Carboxyhemoglobin: 1.4 % (ref 0.5–1.5)
Methemoglobin: 0.6 % (ref 0.0–1.5)
O2 Saturation: 50.8 %
Total hemoglobin: 15.3 g/dL (ref 12.0–16.0)

## 2020-03-05 LAB — MAGNESIUM: Magnesium: 1.9 mg/dL (ref 1.7–2.4)

## 2020-03-05 LAB — DIGOXIN LEVEL: Digoxin Level: <0.2 ng/mL — ABNORMAL LOW (ref 0.8–2.0)

## 2020-03-05 MED ORDER — POTASSIUM CHLORIDE CRYS ER 20 MEQ PO TBCR
40.0000 meq | EXTENDED_RELEASE_TABLET | ORAL | Status: DC
  Filled 2020-03-05: qty 2

## 2020-03-05 MED ORDER — MAGNESIUM SULFATE 2 GM/50ML IV SOLN
2.0000 g | Freq: Once | INTRAVENOUS | Status: AC
  Administered 2020-03-05: 2 g via INTRAVENOUS
  Filled 2020-03-05: qty 50

## 2020-03-05 MED ORDER — POTASSIUM CHLORIDE CRYS ER 10 MEQ PO TBCR
40.0000 meq | EXTENDED_RELEASE_TABLET | ORAL | Status: AC
  Administered 2020-03-05 (×2): 40 meq via ORAL
  Filled 2020-03-05 (×2): qty 4

## 2020-03-05 MED ORDER — POTASSIUM CHLORIDE CRYS ER 10 MEQ PO TBCR
40.0000 meq | EXTENDED_RELEASE_TABLET | Freq: Once | ORAL | Status: AC
  Administered 2020-03-05: 40 meq via ORAL
  Filled 2020-03-05: qty 4

## 2020-03-05 MED ORDER — METOLAZONE 2.5 MG PO TABS
2.5000 mg | ORAL_TABLET | Freq: Once | ORAL | Status: AC
  Administered 2020-03-05: 2.5 mg via ORAL
  Filled 2020-03-05: qty 1

## 2020-03-05 MED ORDER — POTASSIUM CHLORIDE CRYS ER 20 MEQ PO TBCR
40.0000 meq | EXTENDED_RELEASE_TABLET | Freq: Once | ORAL | Status: DC
  Filled 2020-03-05: qty 2

## 2020-03-05 MED ORDER — MILRINONE LACTATE IN DEXTROSE 20-5 MG/100ML-% IV SOLN
0.1250 ug/kg/min | INTRAVENOUS | Status: DC
  Administered 2020-03-05 – 2020-03-08 (×9): 0.25 ug/kg/min via INTRAVENOUS
  Administered 2020-03-08 – 2020-03-09 (×2): 0.125 ug/kg/min via INTRAVENOUS
  Filled 2020-03-05 (×13): qty 100

## 2020-03-05 NOTE — Progress Notes (Signed)
Patient ID: Phillip Young, male   DOB: 1986/07/05, 34 y.o.   MRN: [id number]     Advanced Heart Failure Rounding Note  PCP-Cardiologist: No primary care provider on file.   Subjective:    He did not diurese as well yesterday, SBP as low as 80s at times. Creatinine up to 1.5. CVP 18-19 today.   Significant hypercarbia overnight with ABG 7.33/75/76.  He was not wearing Bipap but was convinced to wear it this morning, now sleeping on Bipap.    RHC/LHC: Coronary Findings  Diagnostic Dominance: Right Left Main  No significant disease.  Left Anterior Descending  Luminal irregularities.  Ramus Intermedius  Large, no significant disease.  Left Circumflex  Small, no significant disease.  Right Coronary Artery  No significant disease.  Intervention  No interventions have been documented. Right Heart  Right Heart Pressures RHC Procedural Findings: Hemodynamics (mmHg) RA mean 19 RV 67/23 PA 71/20, mean 39 PCWP mean 23 LV 97/29 AO 95/60  Oxygen saturations: PA 61% AO 95%  Cardiac Output (Fick) 6.34  Cardiac Index (Fick) 2.1 PVR 2.5 WU     Objective:   Weight Range: (!) 203.5 kg Body mass index is 60.85 kg/m.   Vital Signs:   Temp:  [98.7 F (37.1 C)-99.3 F (37.4 C)] 99.3 F (37.4 C) (08/11 0418) Pulse Rate:  [0-93] 87 (08/11 0731) Resp:  [0-66] 31 (08/11 0731) BP: (85-140)/(55-90) 102/55 (08/11 0418) SpO2:  [0 %-100 %] 90 % (08/11 0731) FiO2 (%):  [36 %-40 %] 40 % (08/11 0731) Weight:  [203.5 kg] 203.5 kg (08/11 0418) Last BM Date: 03/03/20  Weight change: Filed Weights   03/03/20 0308 03/04/20 0500 03/05/20 0418  Weight: (!) 205.7 kg (!) 202.2 kg (!) 203.5 kg    Intake/Output:   Intake/Output Summary (Last 24 hours) at 03/05/2020 0909 Last data filed at 03/05/2020 0800 Gross per 24 hour  Intake 860 ml  Output 2275 ml  Net -1415 ml      Physical Exam    General: Morbid obesity Neck: JVP 16 cm, no thyromegaly or thyroid nodule.  Lungs: Clear  to auscultation bilaterally with normal respiratory effort. CV: Nonpalpable PMI.  Heart regular S1/S2, no S3/S4, no murmur.  1+ edema to thighs.  Abdomen: Soft, nontender, no hepatosplenomegaly, no distention.  Skin: Intact without lesions or rashes.  Neurologic: Alert and oriented x 3.  Psych: Normal affect. Extremities: No clubbing or cyanosis.  HEENT: Normal.    Telemetry   NSR 80s- 90s (personally reviewed)  Labs    CBC Recent Labs    03/04/20 0553 03/04/20 1346  WBC 8.9  --   HGB 13.7 14.6  HCT 42.9 43.0  MCV 90.1  --   PLT 326  --    Basic Metabolic Panel Recent Labs    03/04/20 0553 03/04/20 0553 03/04/20 1346 03/05/20 0406  NA 133*   < > 129* 131*  K 4.1   < > 3.3* 4.0  CL 85*  --   --  83*  CO2 36*  --   --  37*  GLUCOSE 127*  --   --  122*  BUN 17  --   --  23*  CREATININE 1.05  --   --  1.50*  CALCIUM 9.3  --   --  8.8*  MG 1.9  --   --  1.9   < > = values in this interval not displayed.   Liver Function Tests No results for input(s): AST, ALT, ALKPHOS, BILITOT,  PROT, ALBUMIN in the last 72 hours. No results for input(s): LIPASE, AMYLASE in the last 72 hours. Cardiac Enzymes No results for input(s): CKTOTAL, CKMB, CKMBINDEX, TROPONINI in the last 72 hours.  BNP: BNP (last 3 results) Recent Labs    02/26/20 1730  BNP 368.5*    ProBNP (last 3 results) No results for input(s): PROBNP in the last 8760 hours.   D-Dimer No results for input(s): DDIMER in the last 72 hours. Hemoglobin A1C No results for input(s): HGBA1C in the last 72 hours. Fasting Lipid Panel No results for input(s): CHOL, HDL, LDLCALC, TRIG, CHOLHDL, LDLDIRECT in the last 72 hours. Thyroid Function Tests No results for input(s): TSH, T4TOTAL, T3FREE, THYROIDAB in the last 72 hours.  Invalid input(s): FREET3  Other results:   Imaging    CARDIAC CATHETERIZATION  Result Date: 03/04/2020 1. No significant CAD, nonischemic cardiomyopathy. 2. Filling pressures remain  elevated. 3. Severe pulmonary venous hypertension. 4. Low but not markedly low cardiac output.     Medications:     Scheduled Medications: . acetaZOLAMIDE  250 mg Oral Daily  . carvedilol  3.125 mg Oral BID WC  . Chlorhexidine Gluconate Cloth  6 each Topical Daily  . digoxin  0.125 mg Oral Daily  . fluticasone  2 spray Each Nare Daily  . heparin  5,000 Units Subcutaneous Q8H  . ivabradine  5 mg Oral BID WC  . loratadine  10 mg Oral Daily  . metolazone  2.5 mg Oral Once  . pantoprazole  40 mg Oral Daily  . pneumococcal 23 valent vaccine  0.5 mL Intramuscular Tomorrow-1000  . potassium chloride  40 mEq Oral Once  . sodium chloride flush  10-40 mL Intracatheter Q12H  . sodium chloride flush  3 mL Intravenous Q12H  . sodium chloride flush  3 mL Intravenous Q12H  . sodium chloride flush  3 mL Intravenous Q12H  . spironolactone  25 mg Oral Daily    Infusions: . sodium chloride    . sodium chloride    . furosemide (LASIX) infusion 12 mg/hr (03/04/20 0944)  . milrinone      PRN Medications: sodium chloride, sodium chloride, acetaminophen, benzonatate, guaiFENesin-dextromethorphan, ondansetron (ZOFRAN) IV, polyethylene glycol, sodium chloride flush, sodium chloride flush, sodium chloride flush, traMADol   Assessment/Plan   1. Acute on chronic systolic CHF: Known cardiomyopathy since 2019.  Echo this admission with EF < 20%, moderate LV dilation, severely decreased RV function with severe RV dilation, severe biatrial enlargement, mild MR. No history of ETOH/drugs.  No FH of cardiomyopathy (mother with [place]).  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  Suspect he is too large for cardiac MRI.  Narrow QRS on ECG so not CRT candidate. RHC/LHC on 8/10 with no CAD (nonischemic cardiomyopathy), elevated left and right heart filling pressures, and low CI at 2.1. No co-ox this morning, CVP 18-19 => needs more diuresis.  SBP low at  times overnight, did not diurese as well.  - With rise in creatinine and still volume overloaded with marginal diuresis, will add milrinone 0.25 mcg/kg/min.  - Stop Entresto with rise in creatinine and soft BP.  - Hold Coreg for now.   - Continue Ivabradine 5 mg bid.  - Continue digoxin 0.125 daily.  - Can continue spironolactone.  - Increase Lasix gtt to 15 mg/hr and will give dose of metolazone 2.5 x 1 today.  He is on acetazolamide 250 mg daily with HCO3 37 today.   Louretta Parma  boots.  - Repeat ANA negative.   2. OSA: Strongly suspect.  Hypercarbic respiratory failure in hospital.  - Encourage Bipap for now, repeat ABG on Bipap.  3. Diabetes: Eventual SGLT2 inhibitor.  4. AKI: Creatinine up to 1.5.  BP low now.  Suspect cardiorenal +/- contrast from cath yesterday.  - As above, stopping Entresto and Coreg today.  - Adding milrinone given elevated creatinine, poor diuresis, and marginal CI 2.1 on RHC yesterday.   CRITICAL CARE Performed by: [name]  Total critical care time: 35 minutes  Critical care time was exclusive of separately billable procedures and treating other patients.  Critical care was necessary to treat or prevent imminent or life-threatening deterioration.  Critical care was time spent personally by me on the following activities: development of treatment plan with patient and/or surrogate as well as nursing, discussions with consultants, evaluation of patient's response to treatment, examination of patient, obtaining history from patient or surrogate, ordering and performing treatments and interventions, ordering and review of laboratory studies, ordering and review of radiographic studies, pulse oximetry and re-evaluation of patient's condition.   Length of Stay: 8  [name], MD  03/05/2020, 9:09 AM  Advanced Heart Failure Team Pager [phone] (M-F; 7a - 4p)  Please contact [facility] for night-coverage after hours (4p -7a ) and weekends on  [email or web address]

## 2020-03-05 NOTE — Progress Notes (Signed)
Pt was on the BIPAP and was coughing and took off his mask. I talked to him about keeping the BIPAP on. Pt was little irritated but understood. RT will continue to monitor.

## 2020-03-05 NOTE — Progress Notes (Signed)
PROGRESS NOTE  Cecilio Ohlrich  DOB: Jul 01, 1986  PCP: Patient, No Pcp Per LHT:[id number]  DOA: 02/26/2020  LOS: 8 days   Chief Complaint  Patient presents with  . Cough  . Shortness of Breath   Brief narrative: Patient is a 34 year old male with past medical history of biventricular congestive heart failure, ejection fraction 20%, obesity class III, likely undiagnosed obstructive sleep apnea, GERD and hidradenitis suppurativa.  Patient presented to the ED on 8/3 with worsening dyspnea on minimal exertion, orthopnea and edema.  Apparently, patient had been seen at [facility] system was being managed for non ischemic cardiomyopathy with biventricular failure.  Patient has not had ischemic work-up due to significant orthopnea.  He is under consideration for ICD.   In the ED, he was tachypneic to 30s.  He had bilateral lower extremity pedal edema. Chest x-ray showed diffuse hazy opacification bilaterally suggestive of vascular congestion. Patient was admitted under hospitalist service for CHF exacerbation. Cardiology consultation was obtained.  Patient was admitted and started on IV diuretics.   Cardiology team was consulted.  Patient was started on Lasix drip.   8/10, patient underwent cardiac catheterization.   8/10, patient was also noted to be in hypercapnic respiratory failure.  Initiated on BiPAP.    Subjective: Patient was seen and examined this morning.  On BiPAP.  Alert, awake, able to answer questions. But RN and respiratory therapist at bedside, patient keeps pulling off his BiPAP as soon as his mental status improves.  He then becomes lethargic and gets placed back on BiPAP.  Blood gas from yesterday and this morning noted.  Only slight improvement PCO2.  Assessment/Plan: Acute on chronic biventricular heart failure Nonischemic cardiomyopathy Essential hypertension -Presented with tachypnea, bilateral pedal edema and pulmonary edema -Echo in this admission with  EF less than 20%, moderate LV dilatation, severely decreased RV function and severe RV dilatation. -Cardiac consult appreciated.  Currently on Lasix drip. -Negative balance of 1.4 liters in last 24 hours and 20 L since admission. -8/10, underwent cardiac catheterization, no coronary disease noted.  Nonischemic cardiomyopathy. -Heart failure medications being adjusted by cardiology.  Acute hypercapnic hypoxemic respiratory failure -Respiratory distress on admission secondary to CHF.  Patient probably also has underlying sleep apnea. -8/10, patient was noted to be lethargic.  Blood gas revealed a CO2 level elevated to 78.  Started on BiPAP but patient was noncompliant.  Repeat blood gas this morning shows only small improvement in PCO2 to 74. -Strongly recommend BiPAP throughout the day today and night with short breaks for food and hydration. -Repeat blood gas in the morning. -I have strongly counseled patient to comply with BiPAP.  Hypokalemia/ hypomagnesemia: -Potassium and magnesium level being monitored while on Lasix drip.  New onset diabetes mellitus -A1c 6.8. -Diet control and exercise recommended.  GERD. Continue with pantoprazole.   Morbid obesity - Body mass index is 60.85 kg/m. Patient has been advised to make an attempt to improve diet and exercise patterns to aid in weight loss.  Mobility: Encourage ambulation Code Status:   Code Status: Full Code  Nutritional status: Body mass index is 60.85 kg/m.     Diet Order            Diet NPO time specified  Diet effective now                 DVT prophylaxis: heparin injection 5,000 Units Start: 03/05/20 0600 SCD's Start: 03/04/20 1524   Antimicrobials:  None Fluid: None  Consultants: Cardiology Family  Communication:  None at bedside.  Status is: Inpatient  Remains inpatient appropriate because:Ongoing diagnostic testing needed not appropriate for outpatient work up, Unsafe d/c plan and IV treatments  appropriate due to intensity of illness or inability to take PO   Dispo: The patient is from: Home              Anticipated d/c is to: Home              Anticipated d/c date is: More than 3 days              Patient currently is not medically stable to d/c.  Infusions:  . sodium chloride    . sodium chloride    . furosemide (LASIX) infusion 15 mg/hr (03/05/20 1012)  . magnesium sulfate bolus IVPB 2 g (03/05/20 1205)  . milrinone 0.25 mcg/kg/min (03/05/20 1009)    Scheduled Meds: . acetaZOLAMIDE  250 mg Oral Daily  . Chlorhexidine Gluconate Cloth  6 each Topical Daily  . digoxin  0.125 mg Oral Daily  . fluticasone  2 spray Each Nare Daily  . heparin  5,000 Units Subcutaneous Q8H  . ivabradine  5 mg Oral BID WC  . loratadine  10 mg Oral Daily  . pantoprazole  40 mg Oral Daily  . pneumococcal 23 valent vaccine  0.5 mL Intramuscular Tomorrow-1000  . potassium chloride  40 mEq Oral Once  . sodium chloride flush  10-40 mL Intracatheter Q12H  . sodium chloride flush  3 mL Intravenous Q12H  . sodium chloride flush  3 mL Intravenous Q12H  . sodium chloride flush  3 mL Intravenous Q12H  . spironolactone  25 mg Oral Daily    Antimicrobials: Anti-infectives (From admission, onward)   None      PRN meds: sodium chloride, sodium chloride, acetaminophen, benzonatate, guaiFENesin-dextromethorphan, ondansetron (ZOFRAN) IV, polyethylene glycol, sodium chloride flush, sodium chloride flush, sodium chloride flush, traMADol   Objective: Vitals:   03/05/20 1124 03/05/20 1200  BP:    Pulse: 90 90  Resp: (!) 24   Temp:  98.5 F (36.9 C)  SpO2: 98%     Intake/Output Summary (Last 24 hours) at 03/05/2020 1258 Last data filed at 03/05/2020 1205 Gross per 24 hour  Intake 934.6 ml  Output 2425 ml  Net -1490.4 ml   Filed Weights   03/03/20 0308 03/04/20 0500 03/05/20 0418  Weight: (!) 205.7 kg (!) 202.2 kg (!) 203.5 kg   Weight change: 1.3 kg Body mass index is 60.85 kg/m.    Physical Exam: General exam: Morbidly obese.  Calm on BiPAP at the time of my evaluation Skin: No rashes, lesions or ulcers. HEENT: Atraumatic, normocephalic, supple neck, no obvious bleeding Lungs: Clear to auscultation bilaterally CVS: Regular rate and rhythm with no murmur GI/Abd soft, nontender, distended from obesity, bowel sound present CNS: Opens eyes on verbal command. On BiPAP. Psychiatry: Unable to examine Extremities: He has bilateral pedal edema on Ace wraps.  Data Review: I have personally reviewed the laboratory data and studies available.  Recent Labs  Lab 03/04/20 0553 03/04/20 1346  WBC 8.9  --   HGB 13.7 14.6  HCT 42.9 43.0  MCV 90.1  --   PLT 326  --    Recent Labs  Lab 03/01/20 0133 03/01/20 0133 03/02/20 0213 03/03/20 0050 03/03/20 0817 03/04/20 0553 03/04/20 1346 03/05/20 0406  NA 137   < > 135  --  136 133* 129* 131*  K 3.6   < > 3.4*  --  3.2* 4.1 3.3* 4.0  CL 97*  --  96*  --  92* 85*  --  83*  CO2 27  --  28  --  32 36*  --  37*  GLUCOSE 130*  --  139*  --  167* 127*  --  122*  BUN 11  --  7  --  8 17  --  23*  CREATININE 1.08  --  1.01  --  1.05 1.05  --  1.50*  CALCIUM 9.3  --  8.5*  --  8.6* 9.3  --  8.8*  MG  --   --   --  1.9  --  1.9  --  1.9   < > = values in this interval not displayed.   Lab Results  Component Value Date   HGBA1C 6.8 (H) 02/27/2020    No results found for: CHOL, TRIG, HDL, CHOLHDL, VLDL, LDLCALC, LDLDIRECT, LABVLDL  Signed, Terrilee Croak, MD [facility] Pager: [phone] (Secure Chat preferred). 03/05/2020

## 2020-03-05 NOTE — Progress Notes (Signed)
Pulled out bipap and refused to wear, not in resp. distress. Placed on [place] 4L pulse ox-98%. Continue to monitor.

## 2020-03-05 NOTE — Progress Notes (Signed)
RT NOTE: Pt currently off BIPAP and sitting up in the chair. Pts mental status is appropriate at this time. Rt to continue to monitor.

## 2020-03-05 NOTE — Progress Notes (Signed)
RN called RT and said that patient would like to go on bipap now. RT went into the room pt was sleeping. I woke him up and ask if he was ready and said no, he is ok without it. RT will continue to monitor.

## 2020-03-05 NOTE — Plan of Care (Signed)
  Problem: Education: Goal: Knowledge of General Education information will improve Description: Including pain rating scale, medication(s)/side effects and non-pharmacologic comfort measures Outcome: Progressing   Problem: Activity: Goal: Risk for activity intolerance will decrease Outcome: Progressing   Problem: Nutrition: Goal: Adequate nutrition will be maintained Outcome: Progressing   Problem: Coping: Goal: Level of anxiety will decrease Outcome: Progressing   Problem: Elimination: Goal: Will not experience complications related to bowel motility Outcome: Progressing Goal: Will not experience complications related to urinary retention Outcome: Progressing   Problem: Pain Managment: Goal: General experience of comfort will improve Outcome: Progressing   Problem: Safety: Goal: Ability to remain free from injury will improve Outcome: Progressing   Problem: Skin Integrity: Goal: Risk for impaired skin integrity will decrease Outcome: Progressing   

## 2020-03-05 NOTE — Progress Notes (Addendum)
Floor coverage  Patient admitted for acute hypoxemic and hypercarbic respiratory failure secondary to decompensated CHF (biventricular congestive heart failure with EF 20-25%).  Paged by nursing staff stating patient has refused to wear BiPAP all night.  Currently satting well on 4 L supplemental oxygen.  Previous ABG done yesterday with pH 7.31, PCO2 78, and PO2 91.3. ABG this morning showing PCO2 74.7 and and PO2 75.6.  I spoke to the patient and he does not appear confused.  AAO x4.  I explained to him the importance of wearing his BiPAP mask and he now agrees.  Nursing staff updated.  Repeat ABG in a few hours.

## 2020-03-05 NOTE — Progress Notes (Signed)
Attempted to place pt on BIPAP four times throughout the night. Pt refused and educated. Will continue to monitor.

## 2020-03-05 NOTE — Care Management (Addendum)
Patient continues to exhibit signs of hypercapnia associated with Morbid obesity BMI 60, causing thoracic restriction, hypoxemia and hypercarbic respiratory failure secondary to decompensated CHF (biventricular congestive heart failure with EF 20-25%), and obstructive sleep apnea. Patient requires the use of NIV both at sleep and in the daytime to help with exacerbation periods. The use of the NIV will treat both the patients high PCO2 levels and can reduce the risk of exacerbations and future hospitalizations when used at night and during the day. The patient will need these advanced settings in conjunction with their current medication regimen; BIPAP is not an option due to its functional limitations and the severity of the patient's condition. Failure to have NIV available for use over a 24 hour period could lead to death.   Patient is able to clear airway and manage secretions.

## 2020-03-05 NOTE — TOC Initial Note (Signed)
Transition of Care [facility]) - Initial/Assessment Note    Patient Details  Name: Phillip Young MRN: [id number] Date of Birth: [birth year]  Transition [facility]) CM/SW Contact:    Carles Collet, RN Phone Number: 03/05/2020, 8:32 AM  Clinical Narrative:           Patient is candidate for NIV/ BiPAP at DC.  Notified Zach w [employer], who will follow for orders.          Expected Discharge Plan: Home/Self Care Barriers to Discharge: Continued Medical Work up   Patient Goals and CMS Choice        Expected Discharge Plan and Services Expected Discharge Plan: Home/Self Care                         DME Arranged: NIV DME Agency: [employer] Date DME Agency Contacted: 03/05/20 Time DME Agency Contacted: [phone] Representative spoke with at DME Agency: Thedore Mins            Prior Living Arrangements/Services                       Activities of Daily Living Home Assistive Devices/Equipment: None ADL Screening (condition at time of admission) Patient's cognitive ability adequate to safely complete daily activities?: Yes Is the patient deaf or have difficulty hearing?: No Does the patient have difficulty seeing, even when wearing glasses/contacts?: No Does the patient have difficulty concentrating, remembering, or making decisions?: No Patient able to express need for assistance with ADLs?: Yes Does the patient have difficulty dressing or bathing?: No Independently performs ADLs?: Yes (appropriate for developmental age) Does the patient have difficulty walking or climbing stairs?: No Weakness of Legs: None Weakness of Arms/Hands: None  Permission Sought/Granted                  Emotional Assessment              Admission diagnosis:  Hypokalemia [E87.6] Acute on chronic systolic congestive heart failure ([name]) [I50.23] Acute on chronic systolic (congestive) heart failure ([place]) [I50.23] Patient Active Problem List   Diagnosis Date Noted  . Acute on chronic  systolic congestive heart failure ([place])   . Hypokalemia   . Biventricular heart failure ([place])   . Chronic cough   . Acute on chronic systolic (congestive) heart failure ([place]) 02/26/2020  . Morbid obesity with body mass index (BMI) of 60.0 to 69.9 in adult ([place]) 02/26/2020  . Essential hypertension 02/26/2020  . GERD without esophagitis 02/26/2020  . Diuretic-induced hypokalemia 02/26/2020  . OSA (obstructive sleep apnea) 02/26/2020  . Hidradenitis suppurativa 02/26/2020  . Prediabetes 02/26/2020   PCP:  Patient, No Pcp Per Pharmacy:   [id number] - [place], [place] - [address] AT [address] [place] [address] Phone: [phone] Fax: [phone]  [facility] [place], [place] [address] [place] [address] Phone: [phone] Fax: [phone]     Social Determinants of Health (SDOH) Interventions    Readmission Risk Interventions No flowsheet data found.

## 2020-03-05 NOTE — Progress Notes (Signed)
RN assessed pt and noted pt had removed pressure dressing in place post cath procedure. New pressure dressing applied and pt reeducated on the importance of being compliant with instructions given. Pt also noted to have removed una boots from both legs. Pt stated "my legs were itching and I had to take them off to scratch them." Pt also noted to have fast food on tray while on full liquid diet. Will continue to monitor.

## 2020-03-05 NOTE — Progress Notes (Signed)
Pt found not on bipap. ABG collected and pt lethargic at this time. RT placed pt back on bipap.

## 2020-03-05 NOTE — Progress Notes (Signed)
TR band removed and pressure dressing applied. Pt educated on leaving pressure dressing in place for twenty-four hours and also restricting movement of affected extremity. Pt also educated on monitoring site for bleeding. Will continue to monitor.

## 2020-03-05 NOTE — Progress Notes (Signed)
RT went to check on patient to see if he was still on the BIPAP. Pt is off the BIPAP and eating. RN said she will put the BIPAP back on when pt is ready again for it. Pt is stable and doing well at this moment. RT will continue to monitor.

## 2020-03-06 DIAGNOSIS — I5023 Acute on chronic systolic (congestive) heart failure: Secondary | ICD-10-CM | POA: Diagnosis not present

## 2020-03-06 LAB — BASIC METABOLIC PANEL
Anion gap: 11 (ref 5–15)
BUN: 25 mg/dL — ABNORMAL HIGH (ref 6–20)
CO2: 36 mmol/L — ABNORMAL HIGH (ref 22–32)
Calcium: 8.7 mg/dL — ABNORMAL LOW (ref 8.9–10.3)
Chloride: 81 mmol/L — ABNORMAL LOW (ref 98–111)
Creatinine, Ser: 1.4 mg/dL — ABNORMAL HIGH (ref 0.61–1.24)
GFR calc Af Amer: >60 mL/min (ref >60)
GFR calc non Af Amer: >60 mL/min (ref >60)
Glucose, Bld: 259 mg/dL — ABNORMAL HIGH (ref 70–99)
Potassium: 3.5 mmol/L (ref 3.5–5.1)
Sodium: 128 mmol/L — ABNORMAL LOW (ref 135–145)

## 2020-03-06 LAB — GLUCOSE, CAPILLARY
Glucose-Capillary: 177 mg/dL — ABNORMAL HIGH (ref 70–99)
Glucose-Capillary: 179 mg/dL — ABNORMAL HIGH (ref 70–99)
Glucose-Capillary: 140 mg/dL — ABNORMAL HIGH (ref 70–99)
Glucose-Capillary: 119 mg/dL — ABNORMAL HIGH (ref 70–99)

## 2020-03-06 LAB — COOXEMETRY PANEL
Carboxyhemoglobin: 2 % — ABNORMAL HIGH (ref 0.5–1.5)
Methemoglobin: 0.8 % (ref 0.0–1.5)
O2 Saturation: 70.9 %
Total hemoglobin: 17.2 g/dL — ABNORMAL HIGH (ref 12.0–16.0)

## 2020-03-06 LAB — MAGNESIUM: Magnesium: 2.3 mg/dL (ref 1.7–2.4)

## 2020-03-06 MED ORDER — POTASSIUM CHLORIDE CRYS ER 20 MEQ PO TBCR
40.0000 meq | EXTENDED_RELEASE_TABLET | Freq: Two times a day (BID) | ORAL | Status: AC
  Administered 2020-03-06 (×2): 40 meq via ORAL
  Filled 2020-03-06 (×2): qty 2

## 2020-03-06 MED ORDER — INSULIN ASPART 100 UNIT/ML ~~LOC~~ SOLN: 0.0000 [IU] | Freq: Three times a day (TID) | SUBCUTANEOUS | Status: DC

## 2020-03-06 MED ORDER — INSULIN ASPART 100 UNIT/ML ~~LOC~~ SOLN: 0.0000 [IU] | Freq: Every day | SUBCUTANEOUS | Status: DC

## 2020-03-06 MED ORDER — LOSARTAN POTASSIUM 25 MG PO TABS
12.5000 mg | ORAL_TABLET | Freq: Every day | ORAL | Status: DC
  Administered 2020-03-06: 12.5 mg via ORAL
  Filled 2020-03-06: qty 1

## 2020-03-06 NOTE — Progress Notes (Addendum)
Inpatient Diabetes Program Recommendations  AACE/ADA: New Consensus Statement on Inpatient Glycemic Control (2015)  Target Ranges:  Prepandial:   less than 140 mg/dL      Peak postprandial:   less than 180 mg/dL (1-2 hours)      Critically ill patients:  140 - 180 mg/dL   Lab Results  Component Value Date   GLUCAP 177 (H) 03/06/2020   HGBA1C 6.8 (H) 02/27/2020    Review of Glycemic Control  Diabetes history: None Current orders for Inpatient glycemic control:  Novolog 0-9 units tid + hs  Inpatient Diabetes Program Recommendations:    Glucose 177 this am.  Noted Hospitalist note yesterday, New DM diagnosis diet and lifestyle modifications recommended. Will speak with pt today if pt appropriate.  Addendum 2:19 pm:  Spoke with pt reqarding new DM diagnosis. Pt has DM in his family and just had an uncle with an amputation. 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 PT already modifying his eating habits while here. Pt reporting drinking lots of water and says the reason his glucose levels are up are due to the many cough drops he uses during the day for a chronic cough.  Cautioned pt about fluid intake and to check with his doctor. Discussed exercise with pt. Discussed glucose goals to pt. Discussed the reasons we use insulin in the hospital. Pt motivated and adamant about walking around the nursing unit so he doesn't have to get the insulin ordered.  Discussed with pt RN regarding conversation with pt. Expect fair compliance.  Thanks,  Tama Headings RN, MSN, BC-ADM Inpatient Diabetes Coordinator Team Pager [phone] (8a-5p)

## 2020-03-06 NOTE — Progress Notes (Signed)
Patient is off the BIPAP and is on 4L [place], He keeps taking the BIPAP off and unable to cope with it. RT will continue to monitor.

## 2020-03-06 NOTE — Progress Notes (Signed)
Patient has attempted to wear BiPap multiple times tonight.  He is only able to tolerate it 20min-1hour at a time.  He states "I think it is making me sick".  He does not elaborate on this statement, even when asked about it.  He tolerates 4LNC while awake with sats remaining above 90%.  Will continue to monitor.

## 2020-03-06 NOTE — TOC Progression Note (Signed)
Transition of Care [facility]) - Progression Note    Patient Details  Name: Phillip Young MRN: [id number] Date of Birth: 05-Oct-1985  Transition of Care [facility]) CM/SW [place], RN Phone Number: 03/06/2020, 1:51 PM  Clinical Narrative:    [name] to [place] regarding Home bipap. Co-pay looks like 158/month He will broach this with patient.  It would be beneficial to bring this here and get him used to his own machine with settings, monitor o2 sats, Co2/Abg to  Tweak prior to DC. Continue to follow for further home needs.    Expected Discharge Plan: Home/Self Care Barriers to Discharge: Continued Medical Work up  Expected Discharge Plan and Services Expected Discharge Plan: Home/Self Care                         DME Arranged: NIV DME Agency: [employer] Date DME Agency Contacted: 03/05/20 Time DME Agency Contacted: [phone] Representative spoke with at DME Agency: [place] ([place]) Interventions    Readmission Risk Interventions No flowsheet data found.

## 2020-03-06 NOTE — Progress Notes (Addendum)
Patient ID: Phillip Young, male   DOB: 02-09-1986, 34 y.o.   MRN: [id number]     Advanced Heart Failure Rounding Note  PCP-Cardiologist: No primary care provider on file.   Subjective:    Admitted for a/c systolic HF w/ volume overload. RHC/LHC on 8/10 with no CAD (nonischemic cardiomyopathy), elevated left and right heart filling pressures, and low CI at 2.1. Started on milrinone.   Improved diuresis yesterday, -7L out. Wt down 15 lb. SCr stable at 1.40. K 3.5.   Co-ox 71% on 0.25 of metolazone.   Na 128.   He is sleeping w/o BiPAP. He claims it causes a HA. HCO3 36 today. On Diamox.   RHC/LHC: Coronary Findings  Diagnostic Dominance: Right Left Main  No significant disease.  Left Anterior Descending  Luminal irregularities.  Ramus Intermedius  Large, no significant disease.  Left Circumflex  Small, no significant disease.  Right Coronary Artery  No significant disease.  Intervention  No interventions have been documented. Right Heart  Right Heart Pressures RHC Procedural Findings: Hemodynamics (mmHg) RA mean 19 RV 67/23 PA 71/20, mean 39 PCWP mean 23 LV 97/29 AO 95/60  Oxygen saturations: PA 61% AO 95%  Cardiac Output (Fick) 6.34  Cardiac Index (Fick) 2.1 PVR 2.5 WU     Objective:   Weight Range: (!) 196.6 kg Body mass index is 58.78 kg/m.   Vital Signs:   Temp:  [98 F (36.7 C)-100.9 F (38.3 C)] 98.4 F (36.9 C) (08/12 0734) Pulse Rate:  [87-100] 92 (08/12 0734) Resp:  [18-24] 18 (08/12 0734) BP: (93-130)/(50-63) 103/50 (08/12 0734) SpO2:  [90 %-100 %] 95 % (08/12 0815) Weight:  [196.6 kg] 196.6 kg (08/12 0538) Last BM Date: 03/03/20  Weight change: Filed Weights   03/04/20 0500 03/05/20 0418 03/06/20 0538  Weight: (!) 202.2 kg (!) 203.5 kg (!) 196.6 kg    Intake/Output:   Intake/Output Summary (Last 24 hours) at 03/06/2020 1246 Last data filed at 03/06/2020 1123 Gross per 24 hour  Intake 1642.38 ml  Output 7925 ml  Net -6282.62  ml      Physical Exam    PHYSICAL EXAM: CVP 15-16 General:  Obese,  No respiratory difficulty HEENT: normal Neck: supple. Thick neck, JVD not well visualized. Carotids 2+ bilat; no bruits. No lymphadenopathy or thyromegaly appreciated. Cor: PMI nondisplaced. Regular rate & rhythm. No rubs, gallops or murmurs. Lungs: clear Abdomen: soft, nontender, nondistended. No hepatosplenomegaly. No bruits or masses. Good bowel sounds. Extremities: no cyanosis, clubbing, rash, 1+ bilateral LE edema Neuro: alert & oriented x 3, cranial nerves grossly intact. moves all 4 extremities w/o difficulty. Affect pleasant.    Telemetry   NSR 80s- 90s (personally reviewed)  Labs    CBC Recent Labs    03/04/20 0553 03/04/20 0553 03/04/20 1346 03/04/20 1347  WBC 8.9  --   --   --   HGB 13.7   < > 14.6 15.6  HCT 42.9   < > 43.0 46.0  MCV 90.1  --   --   --   PLT 326  --   --   --    < > = values in this interval not displayed.   Basic Metabolic Panel Recent Labs    03/05/20 0406 03/05/20 0406 03/05/20 1500 03/06/20 0320  NA 131*   < > 126* 128*  K 4.0   < > 3.4* 3.5  CL 83*   < > 80* 81*  CO2 37*   < > 35* 36*  GLUCOSE 122*   < > 305* 259*  BUN 23*   < > 25* 25*  CREATININE 1.50*   < > 1.49* 1.40*  CALCIUM 8.8*   < > 8.8* 8.7*  MG 1.9  --   --  2.3   < > = values in this interval not displayed.   Liver Function Tests No results for input(s): AST, ALT, ALKPHOS, BILITOT, PROT, ALBUMIN in the last 72 hours. No results for input(s): LIPASE, AMYLASE in the last 72 hours. Cardiac Enzymes No results for input(s): CKTOTAL, CKMB, CKMBINDEX, TROPONINI in the last 72 hours.  BNP: BNP (last 3 results) Recent Labs    02/26/20 1730  BNP 368.5*    ProBNP (last 3 results) No results for input(s): PROBNP in the last 8760 hours.   D-Dimer No results for input(s): DDIMER in the last 72 hours. Hemoglobin A1C No results for input(s): HGBA1C in the last 72 hours. Fasting Lipid Panel No  results for input(s): CHOL, HDL, LDLCALC, TRIG, CHOLHDL, LDLDIRECT in the last 72 hours. Thyroid Function Tests No results for input(s): TSH, T4TOTAL, T3FREE, THYROIDAB in the last 72 hours.  Invalid input(s): FREET3  Other results:   Imaging    No results found.   Medications:     Scheduled Medications: . acetaZOLAMIDE  250 mg Oral Daily  . Chlorhexidine Gluconate Cloth  6 each Topical Daily  . digoxin  0.125 mg Oral Daily  . fluticasone  2 spray Each Nare Daily  . heparin  5,000 Units Subcutaneous Q8H  . insulin aspart  0-5 Units Subcutaneous QHS  . insulin aspart  0-9 Units Subcutaneous TID WC  . ivabradine  5 mg Oral BID WC  . loratadine  10 mg Oral Daily  . pantoprazole  40 mg Oral Daily  . pneumococcal 23 valent vaccine  0.5 mL Intramuscular Tomorrow-1000  . sodium chloride flush  10-40 mL Intracatheter Q12H  . sodium chloride flush  3 mL Intravenous Q12H  . sodium chloride flush  3 mL Intravenous Q12H  . sodium chloride flush  3 mL Intravenous Q12H  . spironolactone  25 mg Oral Daily    Infusions: . sodium chloride    . sodium chloride    . furosemide (LASIX) infusion 15 mg/hr (03/06/20 0321)  . milrinone 0.25 mcg/kg/min (03/06/20 1123)    PRN Medications: sodium chloride, sodium chloride, acetaminophen, benzonatate, guaiFENesin-dextromethorphan, ondansetron (ZOFRAN) IV, polyethylene glycol, sodium chloride flush, sodium chloride flush, sodium chloride flush, traMADol   Assessment/Plan   1. Acute on chronic systolic CHF: Known cardiomyopathy since 2019.  Echo this admission with EF < 20%, moderate LV dilation, severely decreased RV function with severe RV dilation, severe biatrial enlargement, mild MR. No history of ETOH/drugs.  No FH of cardiomyopathy (mother with [place]).  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  Suspect he is too large for cardiac MRI.  Narrow QRS on ECG so not CRT candidate.  RHC/LHC on 8/10 with no CAD (nonischemic cardiomyopathy), elevated left and right heart filling pressures, and low CI at 2.1. Started on milrinone 0.25. Co-ox 71% today. Diuresing well on lasix gtt + metolazone + diamox. -7L out yesterday. Wt down an additional 15 lb. Still volume up, CVP 16-17 today - continue lasix gtt at 15 mg/hr.  - continue Diamox 250 mg daily w/ HCO3 at 36 - Entresto on hold for AKI and hypotension  - Hold Coreg for now.   - Continue Ivabradine 5 mg bid.  - Continue  digoxin 0.125 daily.  - Can continue spironolactone.  - start losartan 12.5 mg today  - Unna boots.  - Repeat ANA negative.   2. OSA: Strongly suspect.  Hypercarbic respiratory failure in hospital.  - Encourage Bipap for now 3. Diabetes: Eventual SGLT2 inhibitor.  4. AKI: Creatinine up to 1.5.  BP low now.  Suspect cardiorenal +/- contrast from cath yesterday.  - As above, Entresto and Coreg discontinued - SCr improving on inotrope and w/ diuresis, 1.40 today  - start losartan 12.5 mg today. F/u BMP in AM    Length of Stay: [address] PA-C  03/06/2020, 12:46 PM  Advanced Heart Failure Team Pager [phone] (M-F; [place])  Please contact [facility] for night-coverage after hours (4p -7a ) and weekends on [email or web address]  Patient seen with PA.    Great diuresis yesterday, weight coming down.  Creatinine down to 1.4.  Co-ox 71% with CVP 16-17.  Not doing well with Bipap, it causes a headache.   General: NAD Neck: Thick, JVP 14+, no thyromegaly or thyroid nodule.  Lungs: Clear to auscultation bilaterally with normal respiratory effort. CV: Nondisplaced PMI.  Heart regular S1/S2, no S3/S4, no murmur.  Trace edema. Abdomen: Soft, nontender, no hepatosplenomegaly, no distention.  Skin: Intact without lesions or rashes.  Neurologic: Alert and oriented x 3.  Psych: Normal affect. Extremities: No clubbing or cyanosis.  HEENT: Normal.   Continue Lasix gtt 15 mg/hr + Diamox today.  Will  continue milrinone 0.25 mcg/kg/min.  Needs more fluid off. Add losartan.   Encourage Bipap.   [name] 03/06/2020 2:02 PM

## 2020-03-06 NOTE — Progress Notes (Signed)
RN entered pts room to assess pt during shift change. RN noted pt was off tele and IV pump was turned off. Pt told RN he turned it off because " it was beeping for four hours and nobody came to check it." RN noticed during assessment that the PICC line dressing was soiled and not properly applied. This RN changed the PICC line dressing using sterile technique and restarted pts IV medications. Patient and pts visitor were very upset about the soiled PICC line dressing and were taking pictures. Will continue to monitor.

## 2020-03-06 NOTE — Progress Notes (Signed)
PROGRESS NOTE  Phillip Young  DOB: May 22, 1986  PCP: Patient, No Pcp Per [facility]:[id number]  DOA: 02/26/2020  LOS: 9 days   Chief Complaint  Patient presents with  . Cough  . Shortness of Breath   Brief narrative: Patient is a 34 year old male with past medical history of biventricular congestive heart failure, ejection fraction 20%, obesity class III, likely undiagnosed obstructive sleep apnea, GERD and hidradenitis suppurativa.  Patient presented to the ED on 8/3 with worsening dyspnea on minimal exertion, orthopnea and edema.  Apparently, patient had been seen at [facility] system was being managed for non ischemic cardiomyopathy with biventricular failure.  Patient has not had ischemic work-up due to significant orthopnea.  He is under consideration for ICD.   In the ED, he was tachypneic to 30s.  He had bilateral lower extremity pedal edema. Chest x-ray showed diffuse hazy opacification bilaterally suggestive of vascular congestion. Patient was admitted under hospitalist service for CHF exacerbation. Cardiology consultation was obtained.  Patient has remained on Lasix drip and milrinone drip. 8/10, patient underwent cardiac catheterization.   8/10, patient was also noted to be in hypercapnic respiratory failure.  Initiated on BiPAP, however he has been very noncompliant to BiPAP.  Subjective: Patient was seen and examined this morning.   Sitting up in chair.  On 4 L oxygen by nasal cannula.   Per RN, patient was noncompliant to BiPAP again last night.  This morning reported on for 30 minutes and removed it off again.  Patient complains he gets headache with BiPAP. He is asking me to increase the dose of Ultram. He has been refusing to take insulin.  Assessment/Plan: Acute on chronic biventricular heart failure Nonischemic cardiomyopathy Essential hypertension -Presented with tachypnea, bilateral pedal edema and pulmonary edema -Echo in this admission with EF less  than 20%, moderate LV dilatation, severely decreased RV function and severe RV dilatation. -Cardiac consult appreciated.  Currently on Lasix drip. -Negative balance of 5.7 liters in last 24 hours and 26 L since admission. -8/10, underwent cardiac catheterization, no coronary disease noted.  Nonischemic cardiomyopathy. -Heart failure medications being adjusted by cardiology.  Acute hypercapnic hypoxemic respiratory failure Multifactorial: CHF exacerbation, morbid obesity, likely obesity hypoventilation syndrome -8/10, patient was noted to be lethargic.  Blood gas revealed a CO2 level elevated to 78.  -Since then, patient has been placed on BiPAP but is very noncompliant and keeps on removing BiPAP in less than an hour.  Because of this there has not been much improvement in PCO2 level. -Strongly recommend BiPAP nightly as well as as needed during the day.   -Repeat blood gas in the morning.  Hypokalemia/ hypomagnesemia: -Potassium and magnesium level being monitored while on Lasix drip.  New onset diabetes mellitus -A1c 6.8. -Start on sliding scale insulin with Accu-Cheks. -Diet control and exercise recommended.  GERD. Continue with pantoprazole.   Morbid obesity - Body mass index is 58.78 kg/m. Patient has been advised to make an attempt to improve diet and exercise patterns to aid in weight loss.  Mobility: Encourage ambulation Code Status:   Code Status: Full Code  Nutritional status: Body mass index is 58.78 kg/m.     Diet Order            Diet Heart Room service appropriate? Yes; Fluid consistency: Thin; Fluid restriction: 1200 mL Fluid  Diet effective now                 DVT prophylaxis: heparin injection 5,000 Units Start: 03/05/20 0600  SCD's Start: 03/04/20 1524   Antimicrobials:  None Fluid: None  Consultants: Cardiology Family Communication:  None at bedside.  Status is: Inpatient  Remains inpatient appropriate because:Ongoing diagnostic testing needed  not appropriate for outpatient work up, Unsafe d/c plan and IV treatments appropriate due to intensity of illness or inability to take PO   Dispo: The patient is from: Home              Anticipated d/c is to: Home              Anticipated d/c date is: More than 3 days              Patient currently is not medically stable to d/c.  Infusions:  . sodium chloride    . sodium chloride    . furosemide (LASIX) infusion 15 mg/hr (03/06/20 0321)  . milrinone 0.25 mcg/kg/min (03/06/20 1123)    Scheduled Meds: . acetaZOLAMIDE  250 mg Oral Daily  . Chlorhexidine Gluconate Cloth  6 each Topical Daily  . digoxin  0.125 mg Oral Daily  . fluticasone  2 spray Each Nare Daily  . heparin  5,000 Units Subcutaneous Q8H  . insulin aspart  0-5 Units Subcutaneous QHS  . insulin aspart  0-9 Units Subcutaneous TID WC  . ivabradine  5 mg Oral BID WC  . loratadine  10 mg Oral Daily  . pantoprazole  40 mg Oral Daily  . pneumococcal 23 valent vaccine  0.5 mL Intramuscular Tomorrow-1000  . sodium chloride flush  10-40 mL Intracatheter Q12H  . sodium chloride flush  3 mL Intravenous Q12H  . sodium chloride flush  3 mL Intravenous Q12H  . sodium chloride flush  3 mL Intravenous Q12H  . spironolactone  25 mg Oral Daily    Antimicrobials: Anti-infectives (From admission, onward)   None      PRN meds: sodium chloride, sodium chloride, acetaminophen, benzonatate, guaiFENesin-dextromethorphan, ondansetron (ZOFRAN) IV, polyethylene glycol, sodium chloride flush, sodium chloride flush, sodium chloride flush, traMADol   Objective: Vitals:   03/06/20 0734 03/06/20 0815  BP: (!) 103/50   Pulse: 92   Resp: 18   Temp: 98.4 F (36.9 C)   SpO2: 96% 95%    Intake/Output Summary (Last 24 hours) at 03/06/2020 1314 Last data filed at 03/06/2020 1123 Gross per 24 hour  Intake 1612.08 ml  Output 7925 ml  Net -6312.92 ml   Filed Weights   03/04/20 0500 03/05/20 0418 03/06/20 0538  Weight: (!) 202.2 kg (!)  203.5 kg (!) 196.6 kg   Weight change: -6.9 kg Body mass index is 58.78 kg/m.   Physical Exam: General exam: Morbidly obese.  Awake alert.  Not on BiPAP at the time of my evaluation.  On 4 L oxygen nasal cannula  skin: No rashes, lesions or ulcers. HEENT: Atraumatic, normocephalic, supple neck, no obvious bleeding Lungs: Clear to auscultation bilaterally CVS: Regular rate and rhythm with no murmur GI/Abd soft, nontender, distended from obesity, bowel sound present CNS: Alert, awake, oriented x3. Psychiatry: Unable to examine Extremities: He has bilateral pedal edema on Ace wraps.  Data Review: I have personally reviewed the laboratory data and studies available.  Recent Labs  Lab 03/04/20 0553 03/04/20 1346 03/04/20 1347  WBC 8.9  --   --   HGB 13.7 14.6 15.6  HCT 42.9 43.0 46.0  MCV 90.1  --   --   PLT 326  --   --    Recent Labs  Lab 03/02/20 0213 03/03/20  0050 03/03/20 0817 03/03/20 0817 03/04/20 0553 03/04/20 0553 03/04/20 1346 03/04/20 1347 03/05/20 0406 03/05/20 1500 03/06/20 0320  NA   < >  --  136   < > 133*   < > 129* 134* 131* 126* 128*  K   < >  --  3.2*   < > 4.1   < > 3.3* 3.7 4.0 3.4* 3.5  CL   < >  --  92*  --  85*  --   --   --  83* 80* 81*  CO2   < >  --  32  --  36*  --   --   --  37* 35* 36*  GLUCOSE   < >  --  167*  --  127*  --   --   --  122* 305* 259*  BUN   < >  --  8  --  17  --   --   --  23* 25* 25*  CREATININE   < >  --  1.05  --  1.05  --   --   --  1.50* 1.49* 1.40*  CALCIUM   < >  --  8.6*  --  9.3  --   --   --  8.8* 8.8* 8.7*  MG  --  1.9  --   --  1.9  --   --   --  1.9  --  2.3   < > = values in this interval not displayed.   Lab Results  Component Value Date   HGBA1C 6.8 (H) 02/27/2020    No results found for: CHOL, TRIG, HDL, CHOLHDL, VLDL, LDLCALC, LDLDIRECT, LABVLDL  Signed, Terrilee Croak, MD [facility] Pager: [phone] (Secure Chat preferred). 03/06/2020

## 2020-03-07 DIAGNOSIS — I5023 Acute on chronic systolic (congestive) heart failure: Secondary | ICD-10-CM | POA: Diagnosis not present

## 2020-03-07 LAB — CBC
HCT: 43.1 % (ref 39.0–52.0)
Hemoglobin: 13.6 g/dL (ref 13.0–17.0)
MCH: 27.9 pg (ref 26.0–34.0)
MCHC: 31.6 g/dL (ref 30.0–36.0)
MCV: 88.3 fL (ref 80.0–100.0)
Platelets: 273 10*3/uL (ref 150–400)
RBC: 4.88 MIL/uL (ref 4.22–5.81)
RDW: 15 % (ref 11.5–15.5)
WBC: 9.8 10*3/uL (ref 4.0–10.5)
nRBC: 0 % (ref 0.0–0.2)

## 2020-03-07 LAB — BASIC METABOLIC PANEL
Anion gap: 10 (ref 5–15)
BUN: 25 mg/dL — ABNORMAL HIGH (ref 6–20)
CO2: 37 mmol/L — ABNORMAL HIGH (ref 22–32)
Calcium: 9.3 mg/dL (ref 8.9–10.3)
Chloride: 84 mmol/L — ABNORMAL LOW (ref 98–111)
Creatinine, Ser: 1.25 mg/dL — ABNORMAL HIGH (ref 0.61–1.24)
GFR calc Af Amer: >60 mL/min (ref >60)
GFR calc non Af Amer: >60 mL/min (ref >60)
Glucose, Bld: 120 mg/dL — ABNORMAL HIGH (ref 70–99)
Potassium: 3.6 mmol/L (ref 3.5–5.1)
Sodium: 131 mmol/L — ABNORMAL LOW (ref 135–145)

## 2020-03-07 LAB — GLUCOSE, CAPILLARY
Glucose-Capillary: 127 mg/dL — ABNORMAL HIGH (ref 70–99)
Glucose-Capillary: 156 mg/dL — ABNORMAL HIGH (ref 70–99)
Glucose-Capillary: 135 mg/dL — ABNORMAL HIGH (ref 70–99)
Glucose-Capillary: 156 mg/dL — ABNORMAL HIGH (ref 70–99)

## 2020-03-07 LAB — COOXEMETRY PANEL
Carboxyhemoglobin: 2 % — ABNORMAL HIGH (ref 0.5–1.5)
Methemoglobin: 0.9 % (ref 0.0–1.5)
O2 Saturation: 71.2 %
Total hemoglobin: 14.4 g/dL (ref 12.0–16.0)

## 2020-03-07 LAB — MAGNESIUM: Magnesium: 2.2 mg/dL (ref 1.7–2.4)

## 2020-03-07 MED ORDER — LOSARTAN POTASSIUM 25 MG PO TABS
12.5000 mg | ORAL_TABLET | Freq: Two times a day (BID) | ORAL | Status: DC
  Administered 2020-03-07 – 2020-03-11 (×7): 12.5 mg via ORAL
  Filled 2020-03-07 (×8): qty 1

## 2020-03-07 MED ORDER — POTASSIUM CHLORIDE CRYS ER 20 MEQ PO TBCR
40.0000 meq | EXTENDED_RELEASE_TABLET | Freq: Two times a day (BID) | ORAL | Status: AC
  Filled 2020-03-07 (×3): qty 2

## 2020-03-07 NOTE — Plan of Care (Signed)

## 2020-03-07 NOTE — Progress Notes (Signed)
Pt is removing nasal canula and desating to the 17s. RN attempted to educate patient on importance of being compliant with care. Pt stated he has a headache from the nasal canula but is refusing acetaminophen at this time. Patient also refusing BIPAP. Will continue to monitor.

## 2020-03-07 NOTE — Progress Notes (Addendum)
Patient ID: Phillip Young, male   DOB: 1986-05-25, 34 y.o.   MRN: [id number]     Advanced Heart Failure Rounding Note  PCP-Cardiologist: No primary care provider on file.   Subjective:    Admitted for a/c systolic HF w/ volume overload. RHC/LHC on 8/10 with no CAD (nonischemic cardiomyopathy), elevated left and right heart filling pressures, and low CI at 2.1. Started on milrinone.   Continues to diurese well on lasix gtt. Additional 6.5L out yesterday. Wt down and additional 8 lb. Remains volume overloaded. CVP 15-16 today.   Remains on 0.25 of milrinone. Co-ox 71%. Rhythm stable on tele.   SCr improving w/ diuresis, down from 1.4>>1.25 today. Na improving, 126>>128>>131 today.   BP stable w/ losartan addition.    Continues to refuse bipap. Causes HA. Using [place]. CO2 37 today.   RHC/LHC: Coronary Findings  Diagnostic Dominance: Right Left Main  No significant disease.  Left Anterior Descending  Luminal irregularities.  Ramus Intermedius  Large, no significant disease.  Left Circumflex  Small, no significant disease.  Right Coronary Artery  No significant disease.  Intervention  No interventions have been documented. Right Heart  Right Heart Pressures RHC Procedural Findings: Hemodynamics (mmHg) RA mean 19 RV 67/23 PA 71/20, mean 39 PCWP mean 23 LV 97/29 AO 95/60  Oxygen saturations: PA 61% AO 95%  Cardiac Output (Fick) 6.34  Cardiac Index (Fick) 2.1 PVR 2.5 WU     Objective:   Weight Range: (!) 193.1 kg Body mass index is 57.74 kg/m.   Vital Signs:   Temp:  [97.8 F (36.6 C)-98.5 F (36.9 C)] 97.8 F (36.6 C) (08/13 0734) Pulse Rate:  [46-100] 88 (08/13 0734) Resp:  [12-26] 12 (08/13 0734) BP: (103-130)/(44-72) 108/56 (08/13 0734) SpO2:  [95 %-99 %] 97 % (08/13 0734) Weight:  [193.1 kg] 193.1 kg (08/13 0320) Last BM Date: 03/07/20  Weight change: Filed Weights   03/05/20 0418 03/06/20 0538 03/07/20 0320  Weight: (!) 203.5 kg (!) 196.6 kg  (!) 193.1 kg    Intake/Output:   Intake/Output Summary (Last 24 hours) at 03/07/2020 1022 Last data filed at 03/07/2020 1020 Gross per 24 hour  Intake 1654.22 ml  Output 6500 ml  Net -4845.78 ml      Physical Exam    PHYSICAL EXAM: CVP 15-16 General:  Obese, drowsy looking using [place].  No respiratory difficulty HEENT: normal Neck: supple. Thick neck, JVD not well visualized. Carotids 2+ bilat; no bruits. No lymphadenopathy or thyromegaly appreciated. Cor: PMI nondisplaced. Regular rate & rhythm. No rubs, gallops or murmurs. Lungs: distant BS bilaterally (2/2 obsity/ body habitus) Abdomen: soft, nontender, nondistended. No hepatosplenomegaly. No bruits or masses. Good bowel sounds. Extremities: no cyanosis, clubbing, rash, trace bilateral LE edema Neuro: alert & oriented x 3, cranial nerves grossly intact. moves all 4 extremities w/o difficulty. Affect pleasant.   Telemetry   NSR 80s, no arrhthymias  (personally reviewed)  Labs    CBC Recent Labs    03/04/20 1347 03/07/20 0441  WBC  --  9.8  HGB 15.6 13.6  HCT 46.0 43.1  MCV  --  88.3  PLT  --  962   Basic Metabolic Panel Recent Labs    03/06/20 0320 03/07/20 0441  NA 128* 131*  K 3.5 3.6  CL 81* 84*  CO2 36* 37*  GLUCOSE 259* 120*  BUN 25* 25*  CREATININE 1.40* 1.25*  CALCIUM 8.7* 9.3  MG 2.3 2.2   Liver Function Tests No results for input(s): AST,  ALT, ALKPHOS, BILITOT, PROT, ALBUMIN in the last 72 hours. No results for input(s): LIPASE, AMYLASE in the last 72 hours. Cardiac Enzymes No results for input(s): CKTOTAL, CKMB, CKMBINDEX, TROPONINI in the last 72 hours.  BNP: BNP (last 3 results) Recent Labs    02/26/20 1730  BNP 368.5*    ProBNP (last 3 results) No results for input(s): PROBNP in the last 8760 hours.   D-Dimer No results for input(s): DDIMER in the last 72 hours. Hemoglobin A1C No results for input(s): HGBA1C in the last 72 hours. Fasting Lipid Panel No results for input(s):  CHOL, HDL, LDLCALC, TRIG, CHOLHDL, LDLDIRECT in the last 72 hours. Thyroid Function Tests No results for input(s): TSH, T4TOTAL, T3FREE, THYROIDAB in the last 72 hours.  Invalid input(s): FREET3  Other results:   Imaging    No results found.   Medications:     Scheduled Medications:  acetaZOLAMIDE  250 mg Oral Daily   Chlorhexidine Gluconate Cloth  6 each Topical Daily   digoxin  0.125 mg Oral Daily   fluticasone  2 spray Each Nare Daily   heparin  5,000 Units Subcutaneous Q8H   insulin aspart  0-5 Units Subcutaneous QHS   insulin aspart  0-9 Units Subcutaneous TID WC   ivabradine  5 mg Oral BID WC   loratadine  10 mg Oral Daily   losartan  12.5 mg Oral BID   pantoprazole  40 mg Oral Daily   pneumococcal 23 valent vaccine  0.5 mL Intramuscular Tomorrow-1000   potassium chloride  40 mEq Oral BID   sodium chloride flush  10-40 mL Intracatheter Q12H   sodium chloride flush  3 mL Intravenous Q12H   sodium chloride flush  3 mL Intravenous Q12H   sodium chloride flush  3 mL Intravenous Q12H   spironolactone  25 mg Oral Daily    Infusions:  sodium chloride     sodium chloride     furosemide (LASIX) infusion 15 mg/hr (03/07/20 0200)   milrinone 0.25 mcg/kg/min (03/07/20 1001)    PRN Medications: sodium chloride, sodium chloride, acetaminophen, benzonatate, guaiFENesin-dextromethorphan, ondansetron (ZOFRAN) IV, polyethylene glycol, sodium chloride flush, sodium chloride flush, sodium chloride flush, traMADol   Assessment/Plan   1. Acute on chronic systolic CHF: Known cardiomyopathy since 2019.  Echo this admission with EF < 20%, moderate LV dilation, severely decreased RV function with severe RV dilation, severe biatrial enlargement, mild MR. No history of ETOH/drugs.  No FH of cardiomyopathy (mother with [place]).  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  Suspect he is too large for cardiac  MRI.  Narrow QRS on ECG so not CRT candidate. RHC/LHC on 8/10 with no CAD (nonischemic cardiomyopathy), elevated left and right heart filling pressures, and low CI at 2.1. Started on milrinone 0.25. Co-ox 71% today. Diuresing well on lasix gtt + diamox. -6.5L out yesterday. Wt down an additional 8 lb. Still volume up, CVP 15-16 today - continue lasix gtt at 15 mg/hr.  - continue Diamox 250 mg daily w/ HCO3 at 44  - Hold Coreg for now.   - Continue Ivabradine 5 mg bid.  - Continue digoxin 0.125 daily. Level ok on 8/11 (<0.2) - Continue spironolactone 25 mg daily  - Increase Losartan to 12.5 bid. BP too soft for Entresto - Will try starting SGLT2i soon  - Continue [place] boots.  - Repeat ANA negative.   2. OSA: Strongly suspect.  Hypercarbic respiratory failure in hospital.  - Encourage Bipap but  unable to tolerate due to "HA"  3. Diabetes: Eventual SGLT2 inhibitor.  4. AKI: Creatinine up to 1.5.  Suspect cardiorenal +/- contrast from cath this admit - improving w/ inotrope + diuresis, 1.25 today   - continue to monitor w/ diuresis   Length of Stay: [address] PA-C  03/07/2020, 10:22 AM  Advanced Heart Failure Team Pager [phone] (M-F; 7a - 4p)  Please contact [facility] for night-coverage after hours (4p -7a ) and weekends on [email or web address]  Patient seen with PA, agree with the above note.    Great diuresis again yesterday, weight coming down.  Creatinine down to 1.25.  Co-ox 71% with CVP 12-13 on my check. Not doing well with Bipap, it causes a headache.   General: NAD Neck: Thick. JVP 12 cm, no thyromegaly or thyroid nodule.  Lungs: Clear to auscultation bilaterally with normal respiratory effort. CV: Nondisplaced PMI.  Heart regular S1/S2, no S3/S4, no murmur.  1+ ankle edema.    Abdomen: Soft, nontender, no hepatosplenomegaly, no distention.  Skin: Intact without lesions or rashes.  Neurologic: Alert and oriented x 3.  Psych: Normal affect. Extremities: No  clubbing or cyanosis.  HEENT: Normal.   Continue Lasix gtt 15 mg/hr + Diamox today, hopefully back to po tomorrow with CVP coming down.  Will continue milrinone 0.25 mcg/kg/min today, start weaning tomorrow.  Increase losartan to 12.5 mg bid today, SGLT2 inhibitor soon.    Encourage Bipap. He says that he will try it again.    [name] 03/07/2020 11:06 AM

## 2020-03-07 NOTE — Progress Notes (Signed)
Patient asking if he can be taken off of some of his medications (milrinone + lasix drip) states that he feels like the milrinone is making him "sicker" because it is making his heart "beat too hard and too fast, and giving him a headace" but denies chest pains. RN educated patient on the purpose of these medications and explained that he getting continuous cardiac monitoring and his HR is reading normal. RN asked pt to call if he developed chest pain or pressure in his chest pt denies at this time. VSS. Will monitor.

## 2020-03-07 NOTE — Progress Notes (Signed)
RT entered pts room to collect ABG as order for the morning and pt refused stating " Hell Naw yall and sticking me like that again, that shit hurts". RT explained to pt it was to check his oxygen and CO2 levels, pt stated that "I know they high like they always are". Pt also refusing to keep any form of oxygen on throughout the night and sats dipping down into the 60's. RN made this RT aware that this has been an issue with the pt all night. RT discussed and educated the pt on the importance of being compliant with his oxygen. Pt still continues to pull oxygen off. RT will continue to monitor.

## 2020-03-07 NOTE — Progress Notes (Signed)
Pt declining use of BIPAP at this time. Pt states he just cannot handle it. Pt respiratory status is stable at this time on 6 Lpm  w/no distress noted. RT will continue to monitor.

## 2020-03-07 NOTE — Progress Notes (Signed)
PROGRESS NOTE  Phillip Young  DOB: 1986-03-10  PCP: Patient, No Pcp Per [id number]  DOA: 02/26/2020  LOS: 10 days   Chief Complaint  Patient presents with   Cough   Shortness of Breath   Brief narrative: Patient is a 34 year old male with past medical history of biventricular congestive heart failure, ejection fraction 20%, obesity class III, likely undiagnosed obstructive sleep apnea, GERD and hidradenitis suppurativa.  Patient presented to the ED on 8/3 with worsening dyspnea on minimal exertion, orthopnea and edema.  Apparently, patient had been seen at [facility] system was being managed for non ischemic cardiomyopathy with biventricular failure.  Patient has not had ischemic work-up due to significant orthopnea.  He is under consideration for ICD.   In the ED, he was tachypneic to 30s.  He had bilateral lower extremity pedal edema. Chest x-ray showed diffuse hazy opacification bilaterally suggestive of vascular congestion. Patient was admitted under hospitalist service for CHF exacerbation. Cardiology consultation was obtained.  Patient has remained on Lasix drip and milrinone drip. 8/10, patient underwent cardiac catheterization.   8/10, patient was also noted to be in hypercapnic respiratory failure.  Initiated on BiPAP, however he has been very noncompliant to BiPAP.  Subjective: Patient was seen and examined this morning.   Lying down in bed.  On 4 L oxygen by nasal cannula.  He has been noncompliant to BiPAP all night long.  Patient keeps on repeating that he gets headache with oxygen.  He says he would use BiPAP only if oxygen is not attached to it. Later this morning, he got lethargic again.  Rapid response was called.  He was put back on BiPAP.  He soon woke up and pulled out the mask.  Assessment/Plan: Acute on chronic biventricular heart failure Nonischemic cardiomyopathy Essential hypertension -Presented with tachypnea, bilateral pedal edema and  pulmonary edema -Echo in this admission with EF less than 20%, moderate LV dilatation, severely decreased RV function and severe RV dilatation. -Cardiac consult appreciated.  Currently on Lasix drip and milrinone drip. -Negative balance of 4.5 liters in last 24 hours and 31 L since admission. -8/10, underwent cardiac catheterization, no coronary disease noted.  Nonischemic cardiomyopathy. -Heart failure medications being adjusted by cardiology.  Acute hypercapnic hypoxemic respiratory failure Multifactorial: CHF exacerbation, morbid obesity, likely obesity hypoventilation syndrome -8/10, patient was noted to be lethargic.  Blood gas revealed a CO2 level elevated to 78.  -BiPAP was initiated but he has been very noncompliant using it and keeps on removing it within less than an hour.  Because of this behavior, there has not been adequate improvement in PCO2 level. -Strongly recommend BiPAP nightly as well as as needed during the day.   -He has been refusing blood gas as well. -Again I spent more than 20 minutes in counseling him this morning to use BiPAP.  He said he would. Later this morning, he got lethargic on oxygen by nasal cannula.  Rapid response was called.  He was put back on BiPAP.  He soon woke up and pulled out the mask.  Hypokalemia/ hypomagnesemia: -Potassium and magnesium level being monitored while on Lasix drip.  New onset diabetes mellitus -A1c 6.8. -Start on sliding scale insulin with Accu-Cheks. -Diet control and exercise recommended.  GERD. Continue with pantoprazole.   Morbid obesity - Body mass index is 57.74 kg/m. Patient has been advised to make an attempt to improve diet and exercise patterns to aid in weight loss.  Mobility: Encourage ambulation Code Status:  Code Status: Full Code  Nutritional status: Body mass index is 57.74 kg/m.     Diet Order            Diet Heart Room service appropriate? Yes; Fluid consistency: Thin; Fluid restriction: 1200 mL  Fluid  Diet effective now                 DVT prophylaxis: heparin injection 5,000 Units Start: 03/05/20 0600 SCD's Start: 03/04/20 1524   Antimicrobials:  None Fluid: None  Consultants: Cardiology Family Communication:  None at bedside.  Status is: Inpatient  Remains inpatient appropriate because:Ongoing diagnostic testing needed not appropriate for outpatient work up, Unsafe d/c plan and IV treatments appropriate due to intensity of illness or inability to take PO   Dispo: The patient is from: Home              Anticipated d/c is to: Home              Anticipated d/c date is: More than 3 days              Patient currently is not medically stable to d/c.  Infusions:   sodium chloride     sodium chloride     furosemide (LASIX) infusion 15 mg/hr (03/07/20 0200)   milrinone 0.25 mcg/kg/min (03/07/20 1001)    Scheduled Meds:  acetaZOLAMIDE  250 mg Oral Daily   Chlorhexidine Gluconate Cloth  6 each Topical Daily   digoxin  0.125 mg Oral Daily   fluticasone  2 spray Each Nare Daily   heparin  5,000 Units Subcutaneous Q8H   insulin aspart  0-5 Units Subcutaneous QHS   insulin aspart  0-9 Units Subcutaneous TID WC   ivabradine  5 mg Oral BID WC   loratadine  10 mg Oral Daily   losartan  12.5 mg Oral BID   pantoprazole  40 mg Oral Daily   pneumococcal 23 valent vaccine  0.5 mL Intramuscular Tomorrow-1000   potassium chloride  40 mEq Oral BID   sodium chloride flush  10-40 mL Intracatheter Q12H   sodium chloride flush  3 mL Intravenous Q12H   sodium chloride flush  3 mL Intravenous Q12H   sodium chloride flush  3 mL Intravenous Q12H   spironolactone  25 mg Oral Daily    Antimicrobials: Anti-infectives (From admission, onward)   None      PRN meds: sodium chloride, sodium chloride, acetaminophen, benzonatate, guaiFENesin-dextromethorphan, ondansetron (ZOFRAN) IV, polyethylene glycol, sodium chloride flush, sodium chloride flush, sodium  chloride flush, traMADol   Objective: Vitals:   03/07/20 1230 03/07/20 1233  BP: (!) 103/56   Pulse: 97 94  Resp: (!) 21 18  Temp: (!) 97.1 F (36.2 C)   SpO2: 100% 100%    Intake/Output Summary (Last 24 hours) at 03/07/2020 1330 Last data filed at 03/07/2020 1020 Gross per 24 hour  Intake 1654.22 ml  Output 5850 ml  Net -4195.78 ml   Filed Weights   03/05/20 0418 03/06/20 0538 03/07/20 0320  Weight: (!) 203.5 kg (!) 196.6 kg (!) 193.1 kg   Weight change: -3.5 kg Body mass index is 57.74 kg/m.   Physical Exam: General exam: Morbidly obese.  Awake alert.  Not on BiPAP at the time of my evaluation.  On 4 L oxygen nasal cannula  skin: No rashes, lesions or ulcers. HEENT: Atraumatic, normocephalic, supple neck, no obvious bleeding Lungs: Clear to auscultation bilaterally CVS: Regular rate and rhythm with no murmur GI/Abd soft, nontender, distended from  obesity, bowel sound present CNS: Alert, awake, oriented x3. Psychiatry: Unable to examine Extremities: Improving bilateral pedal edema  Data Review: I have personally reviewed the laboratory data and studies available.  Recent Labs  Lab 03/04/20 0553 03/04/20 1346 03/04/20 1347 03/07/20 0441  WBC 8.9  --   --  9.8  HGB 13.7 14.6 15.6 13.6  HCT 42.9 43.0 46.0 43.1  MCV 90.1  --   --  88.3  PLT 326  --   --  273   Recent Labs  Lab 03/03/20 0050 03/03/20 0817 03/04/20 0553 03/04/20 1346 03/04/20 1347 03/05/20 0406 03/05/20 1500 03/06/20 0320 03/07/20 0441  NA  --    < > 133*   < > 134* 131* 126* 128* 131*  K  --    < > 4.1   < > 3.7 4.0 3.4* 3.5 3.6  CL  --    < > 85*  --   --  83* 80* 81* 84*  CO2  --    < > 36*  --   --  37* 35* 36* 37*  GLUCOSE  --    < > 127*  --   --  122* 305* 259* 120*  BUN  --    < > 17  --   --  23* 25* 25* 25*  CREATININE  --    < > 1.05  --   --  1.50* 1.49* 1.40* 1.25*  CALCIUM  --    < > 9.3  --   --  8.8* 8.8* 8.7* 9.3  MG 1.9  --  1.9  --   --  1.9  --  2.3 2.2   < > =  values in this interval not displayed.   Lab Results  Component Value Date   HGBA1C 6.8 (H) 02/27/2020    No results found for: CHOL, TRIG, HDL, CHOLHDL, VLDL, LDLCALC, LDLDIRECT, LABVLDL  Signed, Terrilee Croak, MD [facility] Pager: [phone] (Secure Chat preferred). 03/07/2020

## 2020-03-07 NOTE — Progress Notes (Signed)
RN entered patient room to find pt un-arousable to voice and stimuli. Pt placed on bipap mask, 2nd RN Dawn in room attempted to arouse patient but was unsuccessful. Attempted to notify MD no return response so Rapid response called and to bedside. STAT abg ordered, after approx 15 minutes on bipap - pt suddenly woke and immediately pulled off bipap mask and refused to be stuck for ABG. Pt completely alert and oriented at that time. Nasal Cannula placed on patient-RN to monitor.

## 2020-03-07 NOTE — Significant Event (Addendum)
Rapid Response Event Note   Reason for Call :  Pt was alert, oriented talking with staff earlier. Shortly after, RN found pt unarousable to voice or sternal rub. RN placed pt on BiPAP initially and contacted RRRN.   Initial Focused Assessment:  Pt lying in bed. Opens eyes to painful stimuli, but does not remain awake. Pt appears to be in no distress, currently on BiPAP. PERRLA, 17mm.    VS: T 97.86F, BP 103/56, HR 97, RR 21, SpO2 100%  Pt suddenly alert, oriented. He removed BiPAP mask and began conversing with staff. ABG order discontinued. RN asked to continue to monitor pt and notify primary attending of event.   Interventions:  CBG: 156 ABG ordered- order cancelled due to pt responding, oriented and conversing with staff  Plan of Care:  -Encourage BiPAP -Continue to monitor for changes in mentation and LOC  Call rapid response for additional needs.   Event Summary:  MD Notified:  Dr. Pietro Cassis notified by primary RN, awaiting call back Call Time: [place] Time: 1222 End Time: [place], RN

## 2020-03-08 DIAGNOSIS — I5023 Acute on chronic systolic (congestive) heart failure: Secondary | ICD-10-CM | POA: Diagnosis not present

## 2020-03-08 LAB — BASIC METABOLIC PANEL
Anion gap: 11 (ref 5–15)
BUN: 23 mg/dL — ABNORMAL HIGH (ref 6–20)
CO2: 36 mmol/L — ABNORMAL HIGH (ref 22–32)
Calcium: 9.7 mg/dL (ref 8.9–10.3)
Chloride: 81 mmol/L — ABNORMAL LOW (ref 98–111)
Creatinine, Ser: 1.2 mg/dL (ref 0.61–1.24)
GFR calc Af Amer: >60 mL/min (ref >60)
GFR calc non Af Amer: >60 mL/min (ref >60)
Glucose, Bld: 198 mg/dL — ABNORMAL HIGH (ref 70–99)
Potassium: 3.6 mmol/L (ref 3.5–5.1)
Sodium: 128 mmol/L — ABNORMAL LOW (ref 135–145)

## 2020-03-08 LAB — GLUCOSE, CAPILLARY
Glucose-Capillary: 236 mg/dL — ABNORMAL HIGH (ref 70–99)
Glucose-Capillary: 117 mg/dL — ABNORMAL HIGH (ref 70–99)
Glucose-Capillary: 123 mg/dL — ABNORMAL HIGH (ref 70–99)
Glucose-Capillary: 143 mg/dL — ABNORMAL HIGH (ref 70–99)

## 2020-03-08 LAB — MAGNESIUM: Magnesium: 2.2 mg/dL (ref 1.7–2.4)

## 2020-03-08 LAB — COOXEMETRY PANEL
Carboxyhemoglobin: 1.8 % — ABNORMAL HIGH (ref 0.5–1.5)
Methemoglobin: 1 % (ref 0.0–1.5)
O2 Saturation: 56.2 %
Total hemoglobin: 15 g/dL (ref 12.0–16.0)

## 2020-03-08 MED ORDER — POTASSIUM CHLORIDE ER 10 MEQ PO TBCR
20.0000 meq | EXTENDED_RELEASE_TABLET | Freq: Two times a day (BID) | ORAL | Status: DC
  Administered 2020-03-08 – 2020-03-11 (×6): 20 meq via ORAL
  Filled 2020-03-08 (×14): qty 2

## 2020-03-08 MED ORDER — TORSEMIDE 20 MG PO TABS
40.0000 mg | ORAL_TABLET | Freq: Two times a day (BID) | ORAL | Status: DC
  Administered 2020-03-08 – 2020-03-09 (×2): 40 mg via ORAL
  Filled 2020-03-08 (×2): qty 2

## 2020-03-08 MED ORDER — DAPAGLIFLOZIN PROPANEDIOL 10 MG PO TABS
10.0000 mg | ORAL_TABLET | Freq: Every day | ORAL | Status: DC
  Filled 2020-03-08 (×4): qty 1

## 2020-03-08 NOTE — Progress Notes (Signed)
PROGRESS NOTE  Phillip Young  DOB: September 19, 1985  PCP: Patient, No Pcp Per [id number]  DOA: 02/26/2020  LOS: 11 days   Chief Complaint  Patient presents with  . Cough  . Shortness of Breath   Brief narrative: Patient is a 34 year old male with past medical history of biventricular congestive heart failure, ejection fraction 20%, obesity class III, likely undiagnosed obstructive sleep apnea, GERD and hidradenitis suppurativa.  Patient presented to the ED on 8/3 with worsening dyspnea on minimal exertion, orthopnea and edema.  Apparently, patient had been seen at [facility] system was being managed for non ischemic cardiomyopathy with biventricular failure.  Patient has not had ischemic work-up due to significant orthopnea.  He is under consideration for ICD.   In the ED, he was tachypneic to 30s.  He had bilateral lower extremity pedal edema. Chest x-ray showed diffuse hazy opacification bilaterally suggestive of vascular congestion. Patient was admitted under hospitalist service for CHF exacerbation. Cardiology consultation was obtained.  Patient has remained on Lasix drip and milrinone drip. 8/10, patient underwent cardiac catheterization.   8/10, patient was also noted to be in hypercapnic respiratory failure.  Initiated on BiPAP, however he has been very noncompliant to BiPAP.  Subjective: Patient was seen and examined this morning.   Sitting up in chair.  Not in distress.  Not on supplemental oxygen this morning while at rest sitting upright. Patient used new BiPAP machine that has been arranged for him to take home. Complains of headache with supplemental oxygen on BiPAP Also wanted to stop Lasix drip and milrinone drip because he thinks Lasix as side effects. Heart rate in 90s, blood pressure 90s and 100s in last 24 hours. Labs this morning show sodium level down to 128, creatinine down to 1.2, magnesium 2.2, potassium 3.6.  Assessment/Plan: Acute on chronic  biventricular heart failure Nonischemic cardiomyopathy Essential hypertension -Presented with tachypnea, bilateral pedal edema and pulmonary edema -Echo in this admission with EF less than 20%, moderate LV dilatation, severely decreased RV function and severe RV dilatation. -Cardiac consult appreciated.  Currently on Lasix drip and milrinone drip.  Defer to cardiology for further adjustment. -Negative balance of 3.9 liters in last 24 hours and 34 L since admission. -8/10, underwent cardiac catheterization, no coronary disease noted.  Nonischemic cardiomyopathy. -Heart failure medications being adjusted by cardiology.  Acute hypercapnic hypoxemic respiratory failure Multifactorial: CHF exacerbation, morbid obesity, likely obesity hypoventilation syndrome -8/10, patient was noted to be lethargic.  Blood gas revealed a CO2 level elevated to 78.  -BiPAP was initiated but he has been very noncompliant using it and keeps on removing it within less than an hour.  Because of this behavior, there has not been adequate improvement in PCO2 level. -Strongly recommend BiPAP nightly as well as as needed during the day.   -He has been refusing blood gas as well. -Now he has new BiPAP machine to take home.  He says he likes it.  I hope his compliance with BiPAP will improve now.  Hypokalemia/ hypomagnesemia: -Potassium and magnesium level this morning at 3.6 and 2.2.   -On potassium 20 mg twice daily.  Continue to monitor for levels while on Lasix drip.  New onset diabetes mellitus -A1c 6.8. -Start on sliding scale insulin with Accu-Cheks. -Diet control and exercise recommended.  GERD. Continue with pantoprazole.   Morbid obesity - Body mass index is 56.99 kg/m. Patient has been advised to make an attempt to improve diet and exercise patterns to aid in weight loss.  Mobility: Encourage ambulation Code Status:   Code Status: Full Code  Nutritional status: Body mass index is 56.99 kg/m.      Diet Order            Diet Heart Room service appropriate? Yes; Fluid consistency: Thin; Fluid restriction: 1200 mL Fluid  Diet effective now                 DVT prophylaxis: heparin injection 5,000 Units Start: 03/05/20 0600 SCD's Start: 03/04/20 1524   Antimicrobials:  None Fluid: None  Consultants: Cardiology Family Communication:  None at bedside.  Status is: Inpatient  Remains inpatient appropriate because:Ongoing diagnostic testing needed not appropriate for outpatient work up, Unsafe d/c plan and IV treatments appropriate due to intensity of illness or inability to take PO   Dispo: The patient is from: Home              Anticipated d/c is to: Home              Anticipated d/c date is: 2 to 3 days              Patient currently is not medically stable to d/c.  Infusions:  . sodium chloride    . sodium chloride    . milrinone 0.125 mcg/kg/min (03/08/20 1127)    Scheduled Meds: . acetaZOLAMIDE  250 mg Oral Daily  . Chlorhexidine Gluconate Cloth  6 each Topical Daily  . dapagliflozin propanediol  10 mg Oral Daily  . digoxin  0.125 mg Oral Daily  . fluticasone  2 spray Each Nare Daily  . heparin  5,000 Units Subcutaneous Q8H  . insulin aspart  0-5 Units Subcutaneous QHS  . insulin aspart  0-9 Units Subcutaneous TID WC  . ivabradine  5 mg Oral BID WC  . loratadine  10 mg Oral Daily  . losartan  12.5 mg Oral BID  . pantoprazole  40 mg Oral Daily  . pneumococcal 23 valent vaccine  0.5 mL Intramuscular Tomorrow-1000  . potassium chloride  20 mEq Oral BID  . sodium chloride flush  10-40 mL Intracatheter Q12H  . sodium chloride flush  3 mL Intravenous Q12H  . sodium chloride flush  3 mL Intravenous Q12H  . sodium chloride flush  3 mL Intravenous Q12H  . spironolactone  25 mg Oral Daily  . torsemide  40 mg Oral BID    Antimicrobials: Anti-infectives (From admission, onward)   None      PRN meds: sodium chloride, sodium chloride, acetaminophen, benzonatate,  guaiFENesin-dextromethorphan, ondansetron (ZOFRAN) IV, polyethylene glycol, sodium chloride flush, sodium chloride flush, sodium chloride flush, traMADol   Objective: Vitals:   03/08/20 0755 03/08/20 1055  BP: 104/65 (!) 113/54  Pulse: 98 90  Resp: (!) 22 18  Temp: 98 F (36.7 C) 98.1 F (36.7 C)  SpO2: 96% 93%    Intake/Output Summary (Last 24 hours) at 03/08/2020 1250 Last data filed at 03/08/2020 0844 Gross per 24 hour  Intake 1523.53 ml  Output 3800 ml  Net -2276.47 ml   Filed Weights   03/06/20 0538 03/07/20 0320 03/08/20 0310  Weight: (!) 196.6 kg (!) 193.1 kg (!) 190.6 kg   Weight change: -2.5 kg Body mass index is 56.99 kg/m.   Physical Exam: General exam: Morbidly obese.  Awake alert.  Sitting up on chair.  On room air.  skin: No rashes, lesions or ulcers. HEENT: Atraumatic, normocephalic, supple neck, no obvious bleeding Lungs: Clear to auscultation bilaterally CVS: Regular rate and  rhythm with no murmur GI/Abd soft, nontender, distended from obesity, bowel sound present CNS: Alert, awake, oriented x3 Psychiatry: Unable to examine Extremities: Improving bilateral pedal edema  Data Review: I have personally reviewed the laboratory data and studies available.  Recent Labs  Lab 03/04/20 0553 03/04/20 1346 03/04/20 1347 03/07/20 0441  WBC 8.9  --   --  9.8  HGB 13.7 14.6 15.6 13.6  HCT 42.9 43.0 46.0 43.1  MCV 90.1  --   --  88.3  PLT 326  --   --  273   Recent Labs  Lab 03/04/20 0553 03/04/20 1346 03/05/20 0406 03/05/20 1500 03/06/20 0320 03/07/20 0441 03/08/20 0300  NA 133*   < > 131* 126* 128* 131* 128*  K 4.1   < > 4.0 3.4* 3.5 3.6 3.6  CL 85*  --  83* 80* 81* 84* 81*  CO2 36*  --  37* 35* 36* 37* 36*  GLUCOSE 127*  --  122* 305* 259* 120* 198*  BUN 17  --  23* 25* 25* 25* 23*  CREATININE 1.05  --  1.50* 1.49* 1.40* 1.25* 1.20  CALCIUM 9.3  --  8.8* 8.8* 8.7* 9.3 9.7  MG 1.9  --  1.9  --  2.3 2.2 2.2   < > = values in this interval not  displayed.   Lab Results  Component Value Date   HGBA1C 6.8 (H) 02/27/2020    No results found for: CHOL, TRIG, HDL, CHOLHDL, VLDL, LDLCALC, LDLDIRECT, LABVLDL  Signed, Terrilee Croak, MD [facility] Pager: [phone] (Secure Chat preferred). 03/08/2020

## 2020-03-08 NOTE — Progress Notes (Signed)
Patient ID: Phillip Young, male   DOB: 09-19-1985, 34 y.o.   MRN: [id number]     Advanced Heart Failure Rounding Note  PCP-Cardiologist: No primary care provider on file.   Subjective:    Admitted for a/c systolic HF w/ volume overload. RHC/LHC on 8/10 with no CAD (nonischemic cardiomyopathy), elevated left and right heart filling pressures, and low CI at 2.1. Started on milrinone.   Continues to diurese well on lasix gtt. Wt down and additional 5 lb overnight. CVP 9-10  Remains on 0.25 of milrinone. Co-ox 71%-> 56%. Rhythm stable on tele.   SCr improving w/ diuresis, down from 1.4>>1.25 -> 1.20 today. Sodium 128  Feels ok. C/o chronic cough. No orthopnea or PND. Was up in chair earlier Drinking a lot of water.    RHC/LHC: Coronary Findings  Diagnostic Dominance: Right Left Main  No significant disease.  Left Anterior Descending  Luminal irregularities.  Ramus Intermedius  Large, no significant disease.  Left Circumflex  Small, no significant disease.  Right Coronary Artery  No significant disease.  Intervention  No interventions have been documented. Right Heart  Right Heart Pressures RHC Procedural Findings: Hemodynamics (mmHg) RA mean 19 RV 67/23 PA 71/20, mean 39 PCWP mean 23 LV 97/29 AO 95/60  Oxygen saturations: PA 61% AO 95%  Cardiac Output (Fick) 6.34  Cardiac Index (Fick) 2.1 PVR 2.5 WU     Objective:   Weight Range: (!) 190.6 kg Body mass index is 56.99 kg/m.   Vital Signs:   Temp:  [97.1 F (36.2 C)-98.7 F (37.1 C)] 98.1 F (36.7 C) (08/14 1055) Pulse Rate:  [88-98] 90 (08/14 1055) Resp:  [18-22] 18 (08/14 1055) BP: (91-113)/(48-67) 113/54 (08/14 1055) SpO2:  [92 %-100 %] 93 % (08/14 1055) Weight:  [190.6 kg] 190.6 kg (08/14 0310) Last BM Date: 03/07/20  Weight change: Filed Weights   03/06/20 0538 03/07/20 0320 03/08/20 0310  Weight: (!) 196.6 kg (!) 193.1 kg (!) 190.6 kg    Intake/Output:   Intake/Output Summary (Last 24  hours) at 03/08/2020 1100 Last data filed at 03/08/2020 0844 Gross per 24 hour  Intake 1523.53 ml  Output 3800 ml  Net -2276.47 ml      Physical Exam    General: Obese male lying flat in bed No resp difficulty HEENT: normal Neck: supple. JVP 9-10. Carotids 2+ bilat; no bruits. No lymphadenopathy or thryomegaly appreciated. Cor: PMI nondisplaced. Regular rate & rhythm. Distant  Lungs: clear Abdomen: markedly obese soft, nontender, nondistended. No hepatosplenomegaly. No bruits or masses. Good bowel sounds. Extremities: no cyanosis, clubbing, rash, edema Neuro: alert & orientedx3, cranial nerves grossly intact. moves all 4 extremities w/o difficulty. Affect pleasant   Telemetry   NSR 80-90s, no arrhthymias  (personally reviewed)  Labs    CBC Recent Labs    03/07/20 0441  WBC 9.8  HGB 13.6  HCT 43.1  MCV 88.3  PLT 678   Basic Metabolic Panel Recent Labs    03/07/20 0441 03/08/20 0300  NA 131* 128*  K 3.6 3.6  CL 84* 81*  CO2 37* 36*  GLUCOSE 120* 198*  BUN 25* 23*  CREATININE 1.25* 1.20  CALCIUM 9.3 9.7  MG 2.2 2.2   Liver Function Tests No results for input(s): AST, ALT, ALKPHOS, BILITOT, PROT, ALBUMIN in the last 72 hours. No results for input(s): LIPASE, AMYLASE in the last 72 hours. Cardiac Enzymes No results for input(s): CKTOTAL, CKMB, CKMBINDEX, TROPONINI in the last 72 hours.  BNP: BNP (last 3  results) Recent Labs    02/26/20 1730  BNP 368.5*    ProBNP (last 3 results) No results for input(s): PROBNP in the last 8760 hours.   D-Dimer No results for input(s): DDIMER in the last 72 hours. Hemoglobin A1C No results for input(s): HGBA1C in the last 72 hours. Fasting Lipid Panel No results for input(s): CHOL, HDL, LDLCALC, TRIG, CHOLHDL, LDLDIRECT in the last 72 hours. Thyroid Function Tests No results for input(s): TSH, T4TOTAL, T3FREE, THYROIDAB in the last 72 hours.  Invalid input(s): FREET3  Other results:   Imaging    No  results found.   Medications:     Scheduled Medications: . acetaZOLAMIDE  250 mg Oral Daily  . Chlorhexidine Gluconate Cloth  6 each Topical Daily  . digoxin  0.125 mg Oral Daily  . fluticasone  2 spray Each Nare Daily  . heparin  5,000 Units Subcutaneous Q8H  . insulin aspart  0-5 Units Subcutaneous QHS  . insulin aspart  0-9 Units Subcutaneous TID WC  . ivabradine  5 mg Oral BID WC  . loratadine  10 mg Oral Daily  . losartan  12.5 mg Oral BID  . pantoprazole  40 mg Oral Daily  . pneumococcal 23 valent vaccine  0.5 mL Intramuscular Tomorrow-1000  . sodium chloride flush  10-40 mL Intracatheter Q12H  . sodium chloride flush  3 mL Intravenous Q12H  . sodium chloride flush  3 mL Intravenous Q12H  . sodium chloride flush  3 mL Intravenous Q12H  . spironolactone  25 mg Oral Daily    Infusions: . sodium chloride    . sodium chloride    . furosemide (LASIX) infusion 15 mg/hr (03/08/20 0856)  . milrinone 0.25 mcg/kg/min (03/08/20 0722)    PRN Medications: sodium chloride, sodium chloride, acetaminophen, benzonatate, guaiFENesin-dextromethorphan, ondansetron (ZOFRAN) IV, polyethylene glycol, sodium chloride flush, sodium chloride flush, sodium chloride flush, traMADol   Assessment/Plan   1. Acute on chronic systolic CHF: Known cardiomyopathy since 2019.  Echo this admission with EF < 20%, moderate LV dilation, severely decreased RV function with severe RV dilation, severe biatrial enlargement, mild MR. No history of ETOH/drugs.  No FH of cardiomyopathy (mother with [place]).  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  Suspect he is too large for cardiac MRI.  Narrow QRS on ECG so not CRT candidate. RHC/LHC on 8/10 with no CAD (nonischemic cardiomyopathy), elevated left and right heart filling pressures, and low CI at 2.1. Started on milrinone 0.25. Co-ox 71%-> 56% today. Diuresing well on lasix gtt + diamox. CVP 9-10 - Will  stop IV lasix and switch back to torsemide - wean milrinone to 0.125 - continue Diamox 250 mg daily w/ HCO3 at 8  - Hold Coreg for now.   - Continue Ivabradine 5 mg bid.  - Continue digoxin 0.125 daily. Level ok on 8/11 (<0.2) - Continue spironolactone 25 mg daily  - Continue Losartan 25 bid.  - Add Farxiga 10 - Repeat ANA negative.   - Ambulate - Supp K  2. OSA: Strongly suspect.  Hypercarbic respiratory failure in hospital.  - Encourage Bipap but unable to tolerate due to "HA"  3. Diabetes:  - Start Farxiga 4. AKI: Creatinine up to 1.5.  Suspect cardiorenal +/- contrast from cath this admit - improving w/ inotrope + diuresis, 1.20 today   - continue to monitor w/ diuresis   Length of Stay: [place], MD  03/08/2020, 11:00 AM  Advanced Heart Failure  Team Pager [phone] (M-F; [place])  Please contact [facility] for night-coverage after hours (4p -7a ) and weekends on [email or web address]

## 2020-03-09 DIAGNOSIS — I5023 Acute on chronic systolic (congestive) heart failure: Secondary | ICD-10-CM | POA: Diagnosis not present

## 2020-03-09 LAB — BASIC METABOLIC PANEL
Anion gap: 11 (ref 5–15)
BUN: 25 mg/dL — ABNORMAL HIGH (ref 6–20)
CO2: 34 mmol/L — ABNORMAL HIGH (ref 22–32)
Calcium: 9.6 mg/dL (ref 8.9–10.3)
Chloride: 84 mmol/L — ABNORMAL LOW (ref 98–111)
Creatinine, Ser: 1.21 mg/dL (ref 0.61–1.24)
GFR calc Af Amer: >60 mL/min (ref >60)
GFR calc non Af Amer: >60 mL/min (ref >60)
Glucose, Bld: 230 mg/dL — ABNORMAL HIGH (ref 70–99)
Potassium: 3.6 mmol/L (ref 3.5–5.1)
Sodium: 129 mmol/L — ABNORMAL LOW (ref 135–145)

## 2020-03-09 LAB — MAGNESIUM: Magnesium: 2.3 mg/dL (ref 1.7–2.4)

## 2020-03-09 LAB — GLUCOSE, CAPILLARY
Glucose-Capillary: 115 mg/dL — ABNORMAL HIGH (ref 70–99)
Glucose-Capillary: 128 mg/dL — ABNORMAL HIGH (ref 70–99)
Glucose-Capillary: 112 mg/dL — ABNORMAL HIGH (ref 70–99)
Glucose-Capillary: 119 mg/dL — ABNORMAL HIGH (ref 70–99)

## 2020-03-09 LAB — COOXEMETRY PANEL
Carboxyhemoglobin: 1.5 % (ref 0.5–1.5)
Methemoglobin: 0.6 % (ref 0.0–1.5)
O2 Saturation: 77.4 %
Total hemoglobin: 14.9 g/dL (ref 12.0–16.0)

## 2020-03-09 MED ORDER — TORSEMIDE 20 MG PO TABS
60.0000 mg | ORAL_TABLET | Freq: Two times a day (BID) | ORAL | Status: DC
  Administered 2020-03-09 – 2020-03-10 (×2): 60 mg via ORAL
  Filled 2020-03-09 (×2): qty 3

## 2020-03-09 MED ORDER — POTASSIUM CHLORIDE CRYS ER 20 MEQ PO TBCR
20.0000 meq | EXTENDED_RELEASE_TABLET | Freq: Once | ORAL | Status: AC
  Administered 2020-03-09: 20 meq via ORAL
  Filled 2020-03-09: qty 1

## 2020-03-09 NOTE — Progress Notes (Signed)
PROGRESS NOTE  [name]  DOB: [birth year]  PCP: Patient, No Pcp Per [id number]  DOA: 02/26/2020  LOS: 12 days   Chief Complaint  Patient presents with  . Cough  . Shortness of Breath   Brief narrative: Patient is a 34 year old male with past medical history of biventricular congestive heart failure, ejection fraction 20%, obesity class III, likely undiagnosed obstructive sleep apnea, GERD and hidradenitis suppurativa.  Patient presented to the ED on 8/3 with worsening dyspnea on minimal exertion, orthopnea and edema.  Apparently, patient had been seen at [facility] system was being managed for non ischemic cardiomyopathy with biventricular failure.  Patient has not had ischemic work-up due to significant orthopnea.  He is under consideration for ICD.   In the ED, he was tachypneic to 30s.  He had bilateral lower extremity pedal edema. Chest x-ray showed diffuse hazy opacification bilaterally suggestive of vascular congestion. Patient was admitted under hospitalist service for CHF exacerbation. Cardiology consultation was obtained.  Patient has remained on Lasix drip and milrinone drip. 8/10, patient underwent cardiac catheterization.   8/10, patient was also noted to be in hypercapnic respiratory failure.  Initiated on BiPAP, however he has been very noncompliant to BiPAP.  Subjective: Patient was seen and examined this morning.   Lying down in bed.  On oxygen by nasal cannula. Alert, awake, oriented x3.  States he was able to use trilogy last night for 4 and half hours. Currently off Lasix drip and on IV Lasix. Negative balance of 1.7 L overnight and 35 L since admission. No fever last 24 hours.  Heart rate in 90s, blood pressure 100s, On labs with sodium 129, potassium 3.6 and magnesium 2.3, creatinine 1.1, bicarbonate 34.  Assessment/Plan: Acute on chronic biventricular heart failure Nonischemic cardiomyopathy Essential hypertension -Presented with  tachypnea, bilateral pedal edema and pulmonary edema -Echo in this admission with EF less than 20%, moderate LV dilatation, severely decreased RV function and severe RV dilatation. -Cardiac consult appreciated.  Patient was placed on Lasix drip and milrinone drip.  Currently off Lasix drip and on IV Lasix..  Milrinone drip ongoing.  Defer to cardiology for further adjustment. -Negative balance of 1.7 liters in last 24 hours and 35 L since admission. -8/10, underwent cardiac catheterization, no coronary disease noted.  Nonischemic cardiomyopathy. -Heart failure medications being adjusted by cardiology.  Acute hypercapnic hypoxemic respiratory failure Multifactorial: CHF exacerbation, morbid obesity, likely obesity hypoventilation syndrome -8/10, patient was noted to be lethargic.  Blood gas revealed a CO2 level elevated to 78.  -BiPAP was initiated but he has been very noncompliant using it and keeps on removing it within less than an hour.  Because of this behavior, there has not been adequate improvement in PCO2 level. -Patient now has a trilogy device at his bedside.  He states he likes it and he states he used it for 4-1/2 hours last night.  Encourage nightly use and also during the day if becomes lethargic. -It would be better to obtain a blood gas if he gets lethargic but patient is very reluctant to getting blood gas done.  Hypokalemia/ hypomagnesemia: -Potassium and magnesium levels are being monitored and replaced. -Potassium 3.6 this morning.  On 20 mEq twice daily twice a day.  New onset diabetes mellitus - A1c 6.8. -Start on sliding scale insulin with Accu-Cheks. -Diet control and exercise recommended.  GERD. Continue with pantoprazole.   Morbid obesity - Body mass index is 57.17 kg/m. Patient has been advised to make an attempt  to improve diet and exercise patterns to aid in weight loss.  Mobility: Encourage ambulation Code Status:   Code Status: Full Code  Nutritional  status: Body mass index is 57.17 kg/m.     Diet Order            Diet Heart Room service appropriate? Yes; Fluid consistency: Thin; Fluid restriction: 1200 mL Fluid  Diet effective now                 DVT prophylaxis: heparin injection 5,000 Units Start: 03/05/20 0600 SCD's Start: 03/04/20 1524   Antimicrobials:  None Fluid: None  Consultants: Cardiology Family Communication:  None at bedside.  Status is: Inpatient  Remains inpatient appropriate because:Ongoing diagnostic testing needed not appropriate for outpatient work up, Unsafe d/c plan and IV treatments appropriate due to intensity of illness or inability to take PO   Dispo: The patient is from: Home              Anticipated d/c is to: Home              Anticipated d/c date is: 2 to 3 days              Patient currently is not medically stable to d/c.  Once cardiology is able to wean him off IV Lasix and milrinone drip, we can discharge him home.  Infusions:  . sodium chloride    . sodium chloride    . milrinone 0.125 mcg/kg/min (03/09/20 6160)    Scheduled Meds: . acetaZOLAMIDE  250 mg Oral Daily  . Chlorhexidine Gluconate Cloth  6 each Topical Daily  . dapagliflozin propanediol  10 mg Oral Daily  . digoxin  0.125 mg Oral Daily  . fluticasone  2 spray Each Nare Daily  . heparin  5,000 Units Subcutaneous Q8H  . insulin aspart  0-5 Units Subcutaneous QHS  . insulin aspart  0-9 Units Subcutaneous TID WC  . ivabradine  5 mg Oral BID WC  . loratadine  10 mg Oral Daily  . losartan  12.5 mg Oral BID  . pantoprazole  40 mg Oral Daily  . pneumococcal 23 valent vaccine  0.5 mL Intramuscular Tomorrow-1000  . potassium chloride  20 mEq Oral BID  . sodium chloride flush  10-40 mL Intracatheter Q12H  . sodium chloride flush  3 mL Intravenous Q12H  . sodium chloride flush  3 mL Intravenous Q12H  . sodium chloride flush  3 mL Intravenous Q12H  . spironolactone  25 mg Oral Daily  . torsemide  40 mg Oral BID     Antimicrobials: Anti-infectives (From admission, onward)   None      PRN meds: sodium chloride, sodium chloride, acetaminophen, benzonatate, guaiFENesin-dextromethorphan, ondansetron (ZOFRAN) IV, polyethylene glycol, sodium chloride flush, sodium chloride flush, sodium chloride flush, traMADol   Objective: Vitals:   03/09/20 0331 03/09/20 0717  BP: (!) 111/45 108/78  Pulse: 89 93  Resp: 20 20  Temp: (!) 97.4 F (36.3 C) 98 F (36.7 C)  SpO2: 95% 90%    Intake/Output Summary (Last 24 hours) at 03/09/2020 0925 Last data filed at 03/09/2020 0900 Gross per 24 hour  Intake 730 ml  Output 2000 ml  Net -1270 ml   Filed Weights   03/07/20 0320 03/08/20 0310 03/09/20 0500  Weight: (!) 193.1 kg (!) 190.6 kg (!) 191.2 kg   Weight change: 0.6 kg Body mass index is 57.17 kg/m.   Physical Exam: General exam: Morbidly obese.  Awake alert.  Lying  on bed.  Not in distress.  On nasal cannula.  skin: No rashes, lesions or ulcers. HEENT: Atraumatic, normocephalic, supple neck, no obvious bleeding Lungs: Clear to auscultation bilaterally. CVS: Regular rate and rhythm with no murmur GI/Abd soft, nontender, distended from obesity, bowel sound present CNS: Alert, awake, oriented x3 Psychiatry: Unable to examine Extremities: Still has trace bilateral pedal edema but much better than at presentation.  Data Review: I have personally reviewed the laboratory data and studies available.  Recent Labs  Lab 03/04/20 0553 03/04/20 1346 03/04/20 1347 03/07/20 0441  WBC 8.9  --   --  9.8  HGB 13.7 14.6 15.6 13.6  HCT 42.9 43.0 46.0 43.1  MCV 90.1  --   --  88.3  PLT 326  --   --  273   Recent Labs  Lab 03/05/20 0406 03/05/20 0406 03/05/20 1500 03/06/20 0320 03/07/20 0441 03/08/20 0300 03/09/20 0320  NA 131*   < > 126* 128* 131* 128* 129*  K 4.0   < > 3.4* 3.5 3.6 3.6 3.6  CL 83*   < > 80* 81* 84* 81* 84*  CO2 37*   < > 35* 36* 37* 36* 34*  GLUCOSE 122*   < > 305* 259* 120*  198* 230*  BUN 23*   < > 25* 25* 25* 23* 25*  CREATININE 1.50*   < > 1.49* 1.40* 1.25* 1.20 1.21  CALCIUM 8.8*   < > 8.8* 8.7* 9.3 9.7 9.6  MG 1.9  --   --  2.3 2.2 2.2 2.3   < > = values in this interval not displayed.   Lab Results  Component Value Date   HGBA1C 6.8 (H) 02/27/2020    No results found for: CHOL, TRIG, HDL, CHOLHDL, VLDL, LDLCALC, LDLDIRECT, LABVLDL  Signed, Terrilee Croak, MD [facility] Pager: [phone] (Secure Chat preferred). 03/09/2020

## 2020-03-09 NOTE — Progress Notes (Signed)
Pt is able to place himself on his trilogy without assistance. RT place equipment within pt reach, pt independent needs very little assistance. RT will continue to monitor.

## 2020-03-09 NOTE — Progress Notes (Signed)
RT setup Trilogy for pt within reach, pt able to place himself on without difficulty. Pt on preset settings w/3 Lpm bled into the system. RT will continue to monitor.

## 2020-03-09 NOTE — Plan of Care (Signed)

## 2020-03-09 NOTE — Progress Notes (Signed)
Patient ID: Phillip Young, male   DOB: 1985/12/27, 34 y.o.   MRN: [id number]     Advanced Heart Failure Rounding Note  PCP-Cardiologist: No primary care provider on file.   Subjective:    Admitted for a/c systolic HF w/ volume overload. RHC/LHC on 8/10 with no CAD (nonischemic cardiomyopathy), elevated left and right heart filling pressures, and low CI at 2.1. Started on milrinone.   Milrinone decreased to 0.125. Switched to torsemide yesterday. Weight up 1 pound. CVP 12. Co-ox 77%  Denies CP, SOB, orthopnea or PND.   RHC/LHC: Coronary Findings  Diagnostic Dominance: Right Left Main  No significant disease.  Left Anterior Descending  Luminal irregularities.  Ramus Intermedius  Large, no significant disease.  Left Circumflex  Small, no significant disease.  Right Coronary Artery  No significant disease.  Intervention  No interventions have been documented. Right Heart  Right Heart Pressures RHC Procedural Findings: Hemodynamics (mmHg) RA mean 19 RV 67/23 PA 71/20, mean 39 PCWP mean 23 LV 97/29 AO 95/60  Oxygen saturations: PA 61% AO 95%  Cardiac Output (Fick) 6.34  Cardiac Index (Fick) 2.1 PVR 2.5 WU     Objective:   Weight Range: (!) 191.2 kg Body mass index is 57.17 kg/m.   Vital Signs:   Temp:  [97.4 F (36.3 C)-98.4 F (36.9 C)] 98 F (36.7 C) (08/15 0717) Pulse Rate:  [89-94] 93 (08/15 0717) Resp:  [15-28] 20 (08/15 0717) BP: (106-118)/(45-81) 108/78 (08/15 0717) SpO2:  [90 %-96 %] 90 % (08/15 0717) Weight:  [191.2 kg] 191.2 kg (08/15 0500) Last BM Date: 03/07/20  Weight change: Filed Weights   03/07/20 0320 03/08/20 0310 03/09/20 0500  Weight: (!) 193.1 kg (!) 190.6 kg (!) 191.2 kg    Intake/Output:   Intake/Output Summary (Last 24 hours) at 03/09/2020 1028 Last data filed at 03/09/2020 0900 Gross per 24 hour  Intake 730 ml  Output 2550 ml  Net -1820 ml      Physical Exam    General: Obese male lying flat in bed No resp  difficulty HEENT: normal Neck: supple.JVP 12  Carotids 2+ bilat; no bruits. No lymphadenopathy or thryomegaly appreciated. Cor: PMI nondisplaced. Regular rate & rhythm. No rubs, gallops or murmurs. Lungs: clear Abdomen: obese soft, nontender, nondistended. No hepatosplenomegaly. No bruits or masses. Good bowel sounds. Extremities: no cyanosis, clubbing, rash, edema Neuro: alert & orientedx3, cranial nerves grossly intact. moves all 4 extremities w/o difficulty. Affect pleasant   Telemetry   NSR 80-90s Personally reviewed  Labs    CBC Recent Labs    03/07/20 0441  WBC 9.8  HGB 13.6  HCT 43.1  MCV 88.3  PLT 623   Basic Metabolic Panel Recent Labs    03/08/20 0300 03/09/20 0320  NA 128* 129*  K 3.6 3.6  CL 81* 84*  CO2 36* 34*  GLUCOSE 198* 230*  BUN 23* 25*  CREATININE 1.20 1.21  CALCIUM 9.7 9.6  MG 2.2 2.3   Liver Function Tests No results for input(s): AST, ALT, ALKPHOS, BILITOT, PROT, ALBUMIN in the last 72 hours. No results for input(s): LIPASE, AMYLASE in the last 72 hours. Cardiac Enzymes No results for input(s): CKTOTAL, CKMB, CKMBINDEX, TROPONINI in the last 72 hours.  BNP: BNP (last 3 results) Recent Labs    02/26/20 1730  BNP 368.5*    ProBNP (last 3 results) No results for input(s): PROBNP in the last 8760 hours.   D-Dimer No results for input(s): DDIMER in the last 72 hours. Hemoglobin  A1C No results for input(s): HGBA1C in the last 72 hours. Fasting Lipid Panel No results for input(s): CHOL, HDL, LDLCALC, TRIG, CHOLHDL, LDLDIRECT in the last 72 hours. Thyroid Function Tests No results for input(s): TSH, T4TOTAL, T3FREE, THYROIDAB in the last 72 hours.  Invalid input(s): FREET3  Other results:   Imaging    No results found.   Medications:     Scheduled Medications:  acetaZOLAMIDE  250 mg Oral Daily   Chlorhexidine Gluconate Cloth  6 each Topical Daily   dapagliflozin propanediol  10 mg Oral Daily   digoxin  0.125 mg  Oral Daily   fluticasone  2 spray Each Nare Daily   heparin  5,000 Units Subcutaneous Q8H   insulin aspart  0-5 Units Subcutaneous QHS   insulin aspart  0-9 Units Subcutaneous TID WC   ivabradine  5 mg Oral BID WC   loratadine  10 mg Oral Daily   losartan  12.5 mg Oral BID   pantoprazole  40 mg Oral Daily   pneumococcal 23 valent vaccine  0.5 mL Intramuscular Tomorrow-1000   potassium chloride  20 mEq Oral BID   sodium chloride flush  10-40 mL Intracatheter Q12H   sodium chloride flush  3 mL Intravenous Q12H   sodium chloride flush  3 mL Intravenous Q12H   sodium chloride flush  3 mL Intravenous Q12H   spironolactone  25 mg Oral Daily   torsemide  40 mg Oral BID    Infusions:  sodium chloride     sodium chloride     milrinone 0.125 mcg/kg/min (03/09/20 0624)    PRN Medications: sodium chloride, sodium chloride, acetaminophen, benzonatate, guaiFENesin-dextromethorphan, ondansetron (ZOFRAN) IV, polyethylene glycol, sodium chloride flush, sodium chloride flush, sodium chloride flush, traMADol   Assessment/Plan   1. Acute on chronic systolic CHF: Known cardiomyopathy since 2019.  Echo this admission with EF < 20%, moderate LV dilation, severely decreased RV function with severe RV dilation, severe biatrial enlargement, mild MR. No history of ETOH/drugs.  No FH of cardiomyopathy (mother with [place]).  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  Suspect he is too large for cardiac MRI.  Narrow QRS on ECG so not CRT candidate. RHC/LHC on 8/10 with no CAD (nonischemic cardiomyopathy), elevated left and right heart filling pressures, and low CI at 2.1. Weaning milrinone now on 0.125. Co-ox 71%-> 56% -> 77% today. On torsemide 40 bid.  - Stop milrinone today. Follow co-ox - continue Diamox 250 mg daily w/ HCO3 at 84  - Hold Coreg for now.   - Continue Ivabradine 5 mg bid.  - Continue digoxin 0.125 daily. Level ok  on 8/11 (<0.2) - Continue spironolactone 25 mg daily  - Continue Losartan 25 bid.  - Continue Farxiga 10 (started yesterday) - Increase torsemide to 60 bid - Repeat ANA negative.   - Continue to ambulate - Supp K  2. OSA: Strongly suspect.  Hypercarbic respiratory failure in hospital.  - Encourage Bipap but unable to tolerate due to "HA"  3. Diabetes:  - Started Farxiga 8/14 4. AKI: Creatinine up to 1.5.  Suspect cardiorenal +/- contrast from cath this admit - improving w/ inotrope + diuresis, stable at 1.21 today   - continue to monitor w/ diuresis  5. Hyponatremia - stable 128-130 - restrict free water (he drinks a lot!)  Length of Stay: 12  Glori Bickers, MD  03/09/2020, 10:28 AM  Advanced Heart Failure Team Pager [phone] (M-F; 7a - 4p)  Please  contact [facility] for night-coverage after hours (4p -7a ) and weekends on [email or web address]

## 2020-03-10 DIAGNOSIS — I5023 Acute on chronic systolic (congestive) heart failure: Secondary | ICD-10-CM | POA: Diagnosis not present

## 2020-03-10 LAB — CBC
HCT: 48.2 % (ref 39.0–52.0)
Hemoglobin: 15.1 g/dL (ref 13.0–17.0)
MCH: 27.9 pg (ref 26.0–34.0)
MCHC: 31.3 g/dL (ref 30.0–36.0)
MCV: 88.9 fL (ref 80.0–100.0)
Platelets: 288 10*3/uL (ref 150–400)
RBC: 5.42 MIL/uL (ref 4.22–5.81)
RDW: 15.6 % — ABNORMAL HIGH (ref 11.5–15.5)
WBC: 10.2 10*3/uL (ref 4.0–10.5)
nRBC: 0 % (ref 0.0–0.2)

## 2020-03-10 LAB — GLUCOSE, CAPILLARY
Glucose-Capillary: 155 mg/dL — ABNORMAL HIGH (ref 70–99)
Glucose-Capillary: 115 mg/dL — ABNORMAL HIGH (ref 70–99)
Glucose-Capillary: 124 mg/dL — ABNORMAL HIGH (ref 70–99)

## 2020-03-10 LAB — COOXEMETRY PANEL
Carboxyhemoglobin: 1.4 % (ref 0.5–1.5)
Carboxyhemoglobin: 1.4 % (ref 0.5–1.5)
Methemoglobin: 0.8 % (ref 0.0–1.5)
Methemoglobin: 0.8 % (ref 0.0–1.5)
O2 Saturation: 48 %
O2 Saturation: 51.4 %
Total hemoglobin: 15.7 g/dL (ref 12.0–16.0)
Total hemoglobin: 15.7 g/dL (ref 12.0–16.0)

## 2020-03-10 LAB — BASIC METABOLIC PANEL
Anion gap: 10 (ref 5–15)
BUN: 22 mg/dL — ABNORMAL HIGH (ref 6–20)
CO2: 32 mmol/L (ref 22–32)
Calcium: 9.8 mg/dL (ref 8.9–10.3)
Chloride: 92 mmol/L — ABNORMAL LOW (ref 98–111)
Creatinine, Ser: 1.11 mg/dL (ref 0.61–1.24)
GFR calc Af Amer: >60 mL/min (ref >60)
GFR calc non Af Amer: >60 mL/min (ref >60)
Glucose, Bld: 116 mg/dL — ABNORMAL HIGH (ref 70–99)
Potassium: 4.6 mmol/L (ref 3.5–5.1)
Sodium: 134 mmol/L — ABNORMAL LOW (ref 135–145)

## 2020-03-10 LAB — MAGNESIUM: Magnesium: 2.4 mg/dL (ref 1.7–2.4)

## 2020-03-10 NOTE — Progress Notes (Addendum)
Patient ID: Phillip Young, male   DOB: Jul 17, 1986, 34 y.o.   MRN: [id number]     Advanced Heart Failure Rounding Note  PCP-Cardiologist: No primary care provider on file.   Subjective:    Admitted for a/c systolic HF w/ volume overload. RHC/LHC on 8/10 with no CAD (nonischemic cardiomyopathy), elevated left and right heart filling pressures, and low CI at 2.1. Started on milrinone.   Milrinone weaned over the weekend, discontinued 8/15. Co-ox low at 48% (77% yesterday).   Good UOP on PO diuretics, -3.7L out yesterday. Wt down an additional 4 lb.   Na trending up w/ diuresis, 134 today. SCr and K WNL.   2nd degree AVB type II, on tele early AM hours ~4AM, occurred during sleep.  He is sitting up in chair. Feels well. Denies fatigue. No dyspnea. CVP currently not connected to PICC line.     RHC/LHC: Coronary Findings  Diagnostic Dominance: Right Left Main  No significant disease.  Left Anterior Descending  Luminal irregularities.  Ramus Intermedius  Large, no significant disease.  Left Circumflex  Small, no significant disease.  Right Coronary Artery  No significant disease.  Intervention  No interventions have been documented. Right Heart  Right Heart Pressures RHC Procedural Findings: Hemodynamics (mmHg) RA mean 19 RV 67/23 PA 71/20, mean 39 PCWP mean 23 LV 97/29 AO 95/60  Oxygen saturations: PA 61% AO 95%  Cardiac Output (Fick) 6.34  Cardiac Index (Fick) 2.1 PVR 2.5 WU     Objective:   Weight Range: (!) 189.6 kg Body mass index is 56.69 kg/m.   Vital Signs:   Temp:  [97.8 F (36.6 C)-98.4 F (36.9 C)] 97.9 F (36.6 C) (08/16 0618) Pulse Rate:  [70-91] 87 (08/16 0618) Resp:  [13-24] 20 (08/16 0618) BP: (106-154)/(60-99) 152/99 (08/16 0618) SpO2:  [94 %-99 %] 99 % (08/16 0618) Weight:  [189.6 kg] 189.6 kg (08/16 0618) Last BM Date: 03/09/20  Weight change: Filed Weights   03/08/20 0310 03/09/20 0500 03/10/20 0618  Weight: (!) 190.6 kg  (!) 191.2 kg (!) 189.6 kg    Intake/Output:   Intake/Output Summary (Last 24 hours) at 03/10/2020 0729 Last data filed at 03/10/2020 0700 Gross per 24 hour  Intake 1519.81 ml  Output 3700 ml  Net -2180.19 ml      Physical Exam    PHYSICAL EXAM: General:  Morbidly obese male sitting up in chair, appearing looks less drowsy today. No respiratory difficulty HEENT: normal Neck: supple. Thick neck, JVD not well visualized. Carotids 2+ bilat; no bruits. No lymphadenopathy or thyromegaly appreciated. Cor: PMI nondisplaced. Regular rate & rhythm. No rubs, gallops or murmurs. Lungs: distant breath sounds, likely 2/2 body habitus/obesity  Abdomen: obese, soft, nontender, nondistended. No hepatosplenomegaly. No bruits or masses. Good bowel sounds. Extremities: no cyanosis, clubbing, rash, edema, + RUE PICC  Neuro: alert & oriented x 3, cranial nerves grossly intact. moves all 4 extremities w/o difficulty. Affect pleasant.   Telemetry   NSR 90s, 2nd degree AVB, type II ~4AM today   Labs    CBC Recent Labs    03/10/20 0550  WBC 10.2  HGB 15.1  HCT 48.2  MCV 88.9  PLT 235   Basic Metabolic Panel Recent Labs    03/09/20 0320 03/10/20 0550  NA 129* 134*  K 3.6 4.6  CL 84* 92*  CO2 34* 32  GLUCOSE 230* 116*  BUN 25* 22*  CREATININE 1.21 1.11  CALCIUM 9.6 9.8  MG 2.3 2.4   Liver Function  Tests No results for input(s): AST, ALT, ALKPHOS, BILITOT, PROT, ALBUMIN in the last 72 hours. No results for input(s): LIPASE, AMYLASE in the last 72 hours. Cardiac Enzymes No results for input(s): CKTOTAL, CKMB, CKMBINDEX, TROPONINI in the last 72 hours.  BNP: BNP (last 3 results) Recent Labs    02/26/20 1730  BNP 368.5*    ProBNP (last 3 results) No results for input(s): PROBNP in the last 8760 hours.   D-Dimer No results for input(s): DDIMER in the last 72 hours. Hemoglobin A1C No results for input(s): HGBA1C in the last 72 hours. Fasting Lipid Panel No results for  input(s): CHOL, HDL, LDLCALC, TRIG, CHOLHDL, LDLDIRECT in the last 72 hours. Thyroid Function Tests No results for input(s): TSH, T4TOTAL, T3FREE, THYROIDAB in the last 72 hours.  Invalid input(s): FREET3  Other results:   Imaging    No results found.   Medications:     Scheduled Medications: . acetaZOLAMIDE  250 mg Oral Daily  . Chlorhexidine Gluconate Cloth  6 each Topical Daily  . dapagliflozin propanediol  10 mg Oral Daily  . digoxin  0.125 mg Oral Daily  . fluticasone  2 spray Each Nare Daily  . heparin  5,000 Units Subcutaneous Q8H  . insulin aspart  0-5 Units Subcutaneous QHS  . insulin aspart  0-9 Units Subcutaneous TID WC  . ivabradine  5 mg Oral BID WC  . loratadine  10 mg Oral Daily  . losartan  12.5 mg Oral BID  . pantoprazole  40 mg Oral Daily  . pneumococcal 23 valent vaccine  0.5 mL Intramuscular Tomorrow-1000  . potassium chloride  20 mEq Oral BID  . sodium chloride flush  10-40 mL Intracatheter Q12H  . sodium chloride flush  3 mL Intravenous Q12H  . sodium chloride flush  3 mL Intravenous Q12H  . sodium chloride flush  3 mL Intravenous Q12H  . spironolactone  25 mg Oral Daily  . torsemide  60 mg Oral BID    Infusions: . sodium chloride    . sodium chloride      PRN Medications: sodium chloride, sodium chloride, acetaminophen, benzonatate, guaiFENesin-dextromethorphan, ondansetron (ZOFRAN) IV, polyethylene glycol, sodium chloride flush, sodium chloride flush, sodium chloride flush, traMADol   Assessment/Plan   1. Acute on chronic systolic CHF: Known cardiomyopathy since 2019.  Echo this admission with EF < 20%, moderate LV dilation, severely decreased RV function with severe RV dilation, severe biatrial enlargement, mild MR. No history of ETOH/drugs.  No FH of cardiomyopathy (mother with [place]).  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  Suspect he is too large for cardiac MRI.  Narrow QRS  on ECG so not CRT candidate. RHC/LHC on 8/10 with no CAD (nonischemic cardiomyopathy), elevated left and right heart filling pressures, and low CI at 2.1. Started on milrinone. Milrinone weaned and discontinued yesterday. Co-ox 77>>48% today (repeat stat co-ox, 51%) - will hold diuretic today to help w/ pre-load (torsemide and diamox) - Hold Coreg for now w/ low output.  - Continue Ivabradine 5 mg bid.  - Continue digoxin 0.125 daily. Level ok on 8/11 (<0.2) - Continue spironolactone 25 mg daily  - Continue Losartan 25 bid - Continue Farxiga 10  - Repeat ANA negative.   - Continue to ambulate - Volume status improved, have asked RN to set up CVP for measurement  - if co-ox remains low off milrinone, may need to consider home inotrope  2. OSA: Strongly suspect.  Hypercarbic respiratory failure  in hospital.  - Encourage Bipap but unable to tolerate due to "HA"  3. Diabetes:  - Started Farxiga 8/14 4. AKI: Creatinine up to 1.5.  Suspect cardiorenal +/- contrast from cath this admit - improving w/ inotrope + diuresis, stable at 1.11 today   - continue to monitor w/ diuresis  5. Hyponatremia Improving w/ diuresis and fluid restriction, 134 today  - continue to restrict free water (he drinks a lot!) 6. 2nd Degree AV Block, Type 2:  - occurred early morning hours during sleep, suspect OSA - no daytime AV block - encouraged use of BiPAP  Length of Stay: [id number], PA-C  03/10/2020, 7:29 AM  Advanced Heart Failure Team Pager [phone] (M-F; 7a - 4p)  Please contact [facility] for night-coverage after hours (4p -7a ) and weekends on [email or web address]  Patient seen and examined with the above-signed Advanced Practice Provider and/or Housestaff. I personally reviewed laboratory data, imaging studies and relevant notes. I independently examined the patient and formulated the important aspects of the plan. I have edited the note to reflect any of my changes or salient points. I have  personally discussed the plan with the patient and/or family.  Milrinone stopped yesterday. Torsemide increased. Weight down. Co-ox down to 48%. Recheck 51%  Renal function stable.   General:  Sitting in chair . No resp difficulty HEENT: normal Neck: supple. JVP hard to see. Carotids 2+ bilat; no bruits. No lymphadenopathy or thryomegaly appreciated. Cor: PMI nondisplaced. Regular rate & rhythm. No rubs, gallops or murmurs. Lungs: clear Abdomen: obese soft, nontender, nondistended. No hepatosplenomegaly. No bruits or masses. Good bowel sounds. Extremities: no cyanosis, clubbing, rash, edema Neuro: alert & orientedx3, cranial nerves grossly intact. moves all 4 extremities w/o difficulty. Affect pleasant  Suspect he is on the dry side. Co-ox down. Will hold diuretics today. Repeat co-ox in am. Not good candidate for home inotropes. Nocturnal Type 2 HB due to OSA.  Transient. Encourage BIPAP use.   Glori Bickers, MD  2:20 PM

## 2020-03-10 NOTE — Progress Notes (Signed)
PROGRESS NOTE  [name]  DOB: 05-23-1986  PCP: Patient, No Pcp Per [facility]:[id number]  DOA: 02/26/2020  LOS: 13 days   Chief Complaint  Patient presents with  . Cough  . Shortness of Breath   Brief narrative: Patient is a 34 year old male with past medical history of biventricular congestive heart failure, ejection fraction 20%, obesity class III, likely undiagnosed obstructive sleep apnea, GERD and hidradenitis suppurativa.  Patient presented to the ED on 8/3 with worsening dyspnea on minimal exertion, orthopnea and edema.  Apparently, patient had been seen at [facility] was being managed for non ischemic cardiomyopathy with biventricular failure.  Patient has not had ischemic work-up due to significant orthopnea.  He is under consideration for ICD.   In the ED, he was tachypneic to 30s.  He had bilateral lower extremity pedal edema. Chest x-ray showed diffuse hazy opacification bilaterally suggestive of vascular congestion. Patient was admitted under hospitalist service for CHF exacerbation. Cardiology consultation was obtained.  Patient has remained on Lasix drip and milrinone drip. 8/10, patient underwent cardiac catheterization.   8/10, patient was also noted to be in hypercapnic respiratory failure.  Initiated on BiPAP, however he has been very noncompliant to BiPAP.  Subjective: Patient was seen and examined this morning.   Lying down in bed.  Has trilogy mask on.  He says he likes it.  Negative balance of 2.1 L overnight and 38 L since admission. No fever last 24 hours.  Heart rate in 90s, blood pressure in 140s and 150s. Labs this morning with potassium 4.6, magnesium 2.4, coming down 32.  Assessment/Plan: Acute on chronic biventricular heart failure Nonischemic cardiomyopathy Essential hypertension -Presented with tachypnea, bilateral pedal edema and pulmonary edema -Echo in this admission with EF less than 20%, moderate LV dilatation, severely  decreased RV function and severe RV dilatation. -Cardiac consult appreciated.  Patient was placed on Lasix drip and milrinone drip.  Currently off both drips. -Negative balance of 2.1 L overnight and 38 L since admission. -8/10, underwent cardiac catheterization, no coronary disease noted.  Nonischemic cardiomyopathy. -Heart failure medications being adjusted by cardiology.  Acute hypercapnic hypoxemic respiratory failure Multifactorial: CHF exacerbation, morbid obesity, likely obesity hypoventilation syndrome -8/10, patient was noted to be lethargic.  Blood gas revealed a CO2 level elevated to 78.  -BiPAP was initiated on BiPAP but was very inconsistently compliant.   -He now has a trilogy machine arranged and is more compliant with it. -He has been less lethargic now.  He would not agree to blood gas for confirmation.  Hypokalemia/ hypomagnesemia: -Potassium and magnesium levels are being monitored and replaced by cardiology.  New onset diabetes mellitus - A1c 6.8. -Start on sliding scale insulin with Accu-Cheks. -Diet control and exercise recommended.  GERD. Continue with pantoprazole.   Morbid obesity - Body mass index is 56.69 kg/m. Patient has been advised to make an attempt to improve diet and exercise patterns to aid in weight loss.  Mobility: Encourage ambulation Code Status:   Code Status: Full Code  Nutritional status: Body mass index is 56.69 kg/m.     Diet Order            Diet Heart Room service appropriate? Yes; Fluid consistency: Thin; Fluid restriction: 1200 mL Fluid  Diet effective now                 DVT prophylaxis: heparin injection 5,000 Units Start: 03/05/20 0600 SCD's Start: 03/04/20 1524   Antimicrobials:  None Fluid: None  Consultants:  Cardiology Family Communication:  None at bedside.  Status is: Inpatient  Remains inpatient appropriate because:Ongoing diagnostic testing needed not appropriate for outpatient work up, Unsafe d/c plan and  IV treatments appropriate due to intensity of illness or inability to take PO   Dispo: The patient is from: Home              Anticipated d/c is to: Home              Anticipated d/c date is: 2 to 3 days              Patient currently is not medically stable to d/c.  Pending cardiology clearance.  Infusions:  . sodium chloride    . sodium chloride      Scheduled Meds: . Chlorhexidine Gluconate Cloth  6 each Topical Daily  . dapagliflozin propanediol  10 mg Oral Daily  . digoxin  0.125 mg Oral Daily  . fluticasone  2 spray Each Nare Daily  . heparin  5,000 Units Subcutaneous Q8H  . insulin aspart  0-5 Units Subcutaneous QHS  . insulin aspart  0-9 Units Subcutaneous TID WC  . ivabradine  5 mg Oral BID WC  . loratadine  10 mg Oral Daily  . losartan  12.5 mg Oral BID  . pantoprazole  40 mg Oral Daily  . pneumococcal 23 valent vaccine  0.5 mL Intramuscular Tomorrow-1000  . potassium chloride  20 mEq Oral BID  . sodium chloride flush  10-40 mL Intracatheter Q12H  . sodium chloride flush  3 mL Intravenous Q12H  . sodium chloride flush  3 mL Intravenous Q12H  . sodium chloride flush  3 mL Intravenous Q12H  . spironolactone  25 mg Oral Daily    Antimicrobials: Anti-infectives (From admission, onward)   None      PRN meds: sodium chloride, sodium chloride, acetaminophen, benzonatate, guaiFENesin-dextromethorphan, ondansetron (ZOFRAN) IV, polyethylene glycol, sodium chloride flush, sodium chloride flush, sodium chloride flush, traMADol   Objective: Vitals:   03/10/20 0744 03/10/20 1042  BP: (!) 145/121 (!) 121/52  Pulse:  88  Resp:  18  Temp:  98.4 F (36.9 C)  SpO2:  98%    Intake/Output Summary (Last 24 hours) at 03/10/2020 1116 Last data filed at 03/10/2020 1044 Gross per 24 hour  Intake 1389.81 ml  Output 3000 ml  Net -1610.19 ml   Filed Weights   03/08/20 0310 03/09/20 0500 03/10/20 0618  Weight: (!) 190.6 kg (!) 191.2 kg (!) 189.6 kg   Weight change: -1.6  kg Body mass index is 56.69 kg/m.   Physical Exam: General exam: Morbidly obese.  Awake alert.  Lying on bed.  Not in distress.  On trilogy skin: No rashes, lesions or ulcers. HEENT: Atraumatic, normocephalic, supple neck, no obvious bleeding Lungs: Clear to auscultation bilaterally. CVS: Regular rate and rhythm with no murmur GI/Abd soft, nontender, distended from obesity, bowel sound present CNS: Alert, awake, oriented x3 Psychiatry: Unable to examine Extremities: Still has trace bilateral pedal edema but much better than at presentation.  Data Review: I have personally reviewed the laboratory data and studies available.  Recent Labs  Lab 03/04/20 0553 03/04/20 1346 03/04/20 1347 03/07/20 0441 03/10/20 0550  WBC 8.9  --   --  9.8 10.2  HGB 13.7 14.6 15.6 13.6 15.1  HCT 42.9 43.0 46.0 43.1 48.2  MCV 90.1  --   --  88.3 88.9  PLT 326  --   --  273 288   Recent Labs  Lab 03/06/20 0320 03/07/20 0441 03/08/20 0300 03/09/20 0320 03/10/20 0550  NA 128* 131* 128* 129* 134*  K 3.5 3.6 3.6 3.6 4.6  CL 81* 84* 81* 84* 92*  CO2 36* 37* 36* 34* 32  GLUCOSE 259* 120* 198* 230* 116*  BUN 25* 25* 23* 25* 22*  CREATININE 1.40* 1.25* 1.20 1.21 1.11  CALCIUM 8.7* 9.3 9.7 9.6 9.8  MG 2.3 2.2 2.2 2.3 2.4   Lab Results  Component Value Date   HGBA1C 6.8 (H) 02/27/2020    No results found for: CHOL, TRIG, HDL, CHOLHDL, VLDL, LDLCALC, LDLDIRECT, LABVLDL  Signed, Terrilee Croak, MD [facility] Pager: [phone] (Secure Chat preferred). 03/10/2020

## 2020-03-10 NOTE — Progress Notes (Signed)
Patient has home Trilogy at bedside. Patient states he is able to place himself on/off as needed. No assistance needed from RT at this time. Patient aware to call for assistance if needed.

## 2020-03-11 ENCOUNTER — Telehealth (HOSPITAL_COMMUNITY): Payer: Self-pay | Admitting: Pharmacist

## 2020-03-11 LAB — BASIC METABOLIC PANEL
Anion gap: 9 (ref 5–15)
BUN: 17 mg/dL (ref 6–20)
CO2: 30 mmol/L (ref 22–32)
Calcium: 9.5 mg/dL (ref 8.9–10.3)
Chloride: 93 mmol/L — ABNORMAL LOW (ref 98–111)
Creatinine, Ser: 1.03 mg/dL (ref 0.61–1.24)
GFR calc Af Amer: >60 mL/min (ref >60)
GFR calc non Af Amer: >60 mL/min (ref >60)
Glucose, Bld: 120 mg/dL — ABNORMAL HIGH (ref 70–99)
Potassium: 4 mmol/L (ref 3.5–5.1)
Sodium: 132 mmol/L — ABNORMAL LOW (ref 135–145)

## 2020-03-11 LAB — GLUCOSE, CAPILLARY
Glucose-Capillary: 176 mg/dL — ABNORMAL HIGH (ref 70–99)
Glucose-Capillary: 100 mg/dL — ABNORMAL HIGH (ref 70–99)
Glucose-Capillary: 114 mg/dL — ABNORMAL HIGH (ref 70–99)

## 2020-03-11 LAB — COOXEMETRY PANEL
Carboxyhemoglobin: 1.2 % (ref 0.5–1.5)
Methemoglobin: 0.5 % (ref 0.0–1.5)
O2 Saturation: 58.5 %
Total hemoglobin: 16.4 g/dL — ABNORMAL HIGH (ref 12.0–16.0)

## 2020-03-11 LAB — MAGNESIUM: Magnesium: 2.2 mg/dL (ref 1.7–2.4)

## 2020-03-11 MED ORDER — LOSARTAN POTASSIUM 25 MG PO TABS: 12.5000 mg | ORAL_TABLET | Freq: Two times a day (BID) | ORAL | 0 refills | Status: DC

## 2020-03-11 MED ORDER — SPIRONOLACTONE 25 MG PO TABS: 25.0000 mg | ORAL_TABLET | Freq: Every day | ORAL | 0 refills | Status: DC

## 2020-03-11 MED ORDER — DIGOXIN 125 MCG PO TABS: 0.1250 mg | ORAL_TABLET | Freq: Every day | ORAL | 0 refills | Status: DC

## 2020-03-11 MED ORDER — DAPAGLIFLOZIN PROPANEDIOL 10 MG PO TABS: 10.0000 mg | ORAL_TABLET | Freq: Every day | ORAL | 0 refills | Status: AC

## 2020-03-11 MED ORDER — POTASSIUM CHLORIDE ER 20 MEQ PO TBCR: 20.0000 meq | EXTENDED_RELEASE_TABLET | Freq: Two times a day (BID) | ORAL | 0 refills | Status: DC

## 2020-03-11 MED ORDER — IVABRADINE HCL 5 MG PO TABS: 5.0000 mg | ORAL_TABLET | Freq: Two times a day (BID) | ORAL | 0 refills | Status: DC

## 2020-03-11 MED FILL — FARXIGA 10 MG TABLET: 10 | 30 days supply | Qty: 30 | Fill #0

## 2020-03-11 MED FILL — POTASSIUM CHLORIDE 20meqER: 20 | 30 days supply | Qty: 60 | Fill #0

## 2020-03-11 MED FILL — DIGOXIN 0.125 MG TABLET: 125 | 30 days supply | Qty: 30 | Fill #0

## 2020-03-11 MED FILL — LOSARTAN POTASSIUM 25 MG TA: 25 | 30 days supply | Qty: 30 | Fill #0

## 2020-03-11 MED FILL — SPIRONOLACTONE 25 MG TABLET: 25 | 30 days supply | Qty: 30 | Fill #0

## 2020-03-11 NOTE — Plan of Care (Signed)
  Problem: Education: Goal: Knowledge of General Education information will improve Description: Including pain rating scale, medication(s)/side effects and non-pharmacologic comfort measures Outcome: Progressing   Problem: Clinical Measurements: Goal: Ability to maintain clinical measurements within normal limits will improve Outcome: Progressing Goal: Will remain free from infection Outcome: Progressing Goal: Diagnostic test results will improve Outcome: Progressing   Problem: Activity: Goal: Risk for activity intolerance will decrease Outcome: Progressing   Problem: Nutrition: Goal: Adequate nutrition will be maintained Outcome: Progressing   Problem: Coping: Goal: Level of anxiety will decrease Outcome: Progressing

## 2020-03-11 NOTE — Progress Notes (Signed)
Pt has refused all treatment interventions. No CVP's and no pm meds. He states that he is going home tomorrow and he does not need any further interventions. Patient education provided. Will continue to monitor.

## 2020-03-11 NOTE — Progress Notes (Addendum)
Patient ID: Phillip [name], male   DOB: Jan 28, 1986, 34 y.o.   MRN: [id number]     Advanced Heart Failure Rounding Note  PCP-Cardiologist: No primary care provider on file.   Subjective:    Admitted for a/c systolic HF w/ volume overload. RHC/LHC on 8/10 with no CAD (nonischemic cardiomyopathy), elevated left and right heart filling pressures, and low CI at 2.1. Started on milrinone.   Currently off diuretics.  Wt -4lb since yesterday.    Apparently, he refused all PM meds and refused CVP checks and co-ox draw this am. Was concerned about his PICC line and paranoid thinking it could be infected. Had sharp pain at sight yesterday but now resolved. BP elevated this am.   He is sitting up in chair. No dyspnea. No fatigue.   After discussion, he is agreeable to lab draw. Co-ox 59% today (up from 51% yesterday). CVP 7.     RHC/LHC: Coronary Findings  Diagnostic Dominance: Right Left Main  No significant disease.  Left Anterior Descending  Luminal irregularities.  Ramus Intermedius  Large, no significant disease.  Left Circumflex  Small, no significant disease.  Right Coronary Artery  No significant disease.  Intervention  No interventions have been documented. Right Heart  Right Heart Pressures RHC Procedural Findings: Hemodynamics (mmHg) RA mean 19 RV 67/23 PA 71/20, mean 39 PCWP mean 23 LV 97/29 AO 95/60  Oxygen saturations: PA 61% AO 95%  Cardiac Output (Fick) 6.34  Cardiac Index (Fick) 2.1 PVR 2.5 WU     Objective:   Weight Range: (!) 189.6 kg Body mass index is 56.69 kg/m.   Vital Signs:   Temp:  [98.4 F (36.9 C)-98.6 F (37 C)] 98.4 F (36.9 C) (08/16 2310) Pulse Rate:  [88-92] 92 (08/16 2310) Resp:  [18-23] 21 (08/16 2310) BP: (121-152)/(52-121) 152/105 (08/16 1626) SpO2:  [98 %] 98 % (08/16 1042) Last BM Date: 03/10/20  Weight change: Filed Weights   03/08/20 0310 03/09/20 0500 03/10/20 0618  Weight: (!) 190.6 kg (!) 191.2 kg (!) 189.6  kg    Intake/Output:   Intake/Output Summary (Last 24 hours) at 03/11/2020 0714 Last data filed at 03/10/2020 1300 Gross per 24 hour  Intake 360 ml  Output 1050 ml  Net -690 ml      Physical Exam    PHYSICAL EXAM: General:  Obese, sitting up in chair No respiratory difficulty HEENT: normal Neck: supple. Thick neck, JVD not well visualized. Carotids 2+ bilat; no bruits. No lymphadenopathy or thyromegaly appreciated. Cor: PMI nondisplaced. Regular rate & rhythm. No rubs, gallops or murmurs. Lungs: clear  Abdomen: obese, soft, nontender, nondistended. No hepatosplenomegaly. No bruits or masses. Good bowel sounds. Extremities: no cyanosis, clubbing, rash, edema, + RUE PICC  Neuro: alert & oriented x 3, cranial nerves grossly intact. moves all 4 extremities w/o difficulty. Affect pleasant.   Telemetry   NSR upper 90s  Labs    CBC Recent Labs    03/10/20 0550  WBC 10.2  HGB 15.1  HCT 48.2  MCV 88.9  PLT 427   Basic Metabolic Panel Recent Labs    03/09/20 0320 03/10/20 0550  NA 129* 134*  K 3.6 4.6  CL 84* 92*  CO2 34* 32  GLUCOSE 230* 116*  BUN 25* 22*  CREATININE 1.21 1.11  CALCIUM 9.6 9.8  MG 2.3 2.4   Liver Function Tests No results for input(s): AST, ALT, ALKPHOS, BILITOT, PROT, ALBUMIN in the last 72 hours. No results for input(s): LIPASE, AMYLASE in the  last 72 hours. Cardiac Enzymes No results for input(s): CKTOTAL, CKMB, CKMBINDEX, TROPONINI in the last 72 hours.  BNP: BNP (last 3 results) Recent Labs    02/26/20 1730  BNP 368.5*    ProBNP (last 3 results) No results for input(s): PROBNP in the last 8760 hours.   D-Dimer No results for input(s): DDIMER in the last 72 hours. Hemoglobin A1C No results for input(s): HGBA1C in the last 72 hours. Fasting Lipid Panel No results for input(s): CHOL, HDL, LDLCALC, TRIG, CHOLHDL, LDLDIRECT in the last 72 hours. Thyroid Function Tests No results for input(s): TSH, T4TOTAL, T3FREE, THYROIDAB in  the last 72 hours.  Invalid input(s): FREET3  Other results:   Imaging    No results found.   Medications:     Scheduled Medications: . Chlorhexidine Gluconate Cloth  6 each Topical Daily  . dapagliflozin propanediol  10 mg Oral Daily  . digoxin  0.125 mg Oral Daily  . fluticasone  2 spray Each Nare Daily  . heparin  5,000 Units Subcutaneous Q8H  . insulin aspart  0-5 Units Subcutaneous QHS  . insulin aspart  0-9 Units Subcutaneous TID WC  . ivabradine  5 mg Oral BID WC  . loratadine  10 mg Oral Daily  . losartan  12.5 mg Oral BID  . pantoprazole  40 mg Oral Daily  . pneumococcal 23 valent vaccine  0.5 mL Intramuscular Tomorrow-1000  . potassium chloride  20 mEq Oral BID  . sodium chloride flush  10-40 mL Intracatheter Q12H  . sodium chloride flush  3 mL Intravenous Q12H  . sodium chloride flush  3 mL Intravenous Q12H  . sodium chloride flush  3 mL Intravenous Q12H  . spironolactone  25 mg Oral Daily    Infusions: . sodium chloride    . sodium chloride      PRN Medications: sodium chloride, sodium chloride, acetaminophen, benzonatate, guaiFENesin-dextromethorphan, ondansetron (ZOFRAN) IV, polyethylene glycol, sodium chloride flush, sodium chloride flush, sodium chloride flush, traMADol   Assessment/Plan   1. Acute on chronic systolic CHF: Known cardiomyopathy since 2019.  Echo this admission with EF < 20%, moderate LV dilation, severely decreased RV function with severe RV dilation, severe biatrial enlargement, mild MR. No history of ETOH/drugs.  No FH of cardiomyopathy (mother with [place]).  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  Suspect he is too large for cardiac MRI.  Narrow QRS on ECG so not CRT candidate. RHC/LHC on 8/10 with no CAD (nonischemic cardiomyopathy), elevated left and right heart filling pressures, and low CI at 2.1. Started on milrinone. Milrinone weaned and discontinued 8/15. Co-ox  marginal at 59% but not good candidate for home inotropes. CVP 7 today  - Plan to resume torsemide, reduce down to 60 mg once daily  - Hold Coreg for now w/ low output.  - Continue Ivabradine 5 mg bid.  - Continue digoxin 0.125 daily. Level ok on 8/11 (<0.2) - Continue spironolactone 25 mg daily  - Continue Losartan 25 bid. Eventual transition of Entresto if BP can tolerate.  - Continue Farxiga 10  - Repeat ANA negative.   2. OSA: Strongly suspect.  Hypercarbic respiratory failure in hospital.  - Encourage Bipap but unable to tolerate due to "HA"  3. Type 2 Diabetes:  - Hgb A1c 6.8  - Started Farxiga 8/14 4. AKI: Creatinine up to 1.5.  Suspect cardiorenal +/- contrast from cath this admit - improving w/ inotrope + diuresis, stable at 1.11 yesterday. Today's  BMP pending 5. Hyponatremia Improving w/ diuresis and fluid restriction,  Repeat BMP pending  - continue to restrict free water (he drinks a lot!) 6. 2nd Degree AV Block, Type 2:  - occurs during early morning hours during sleep, suspect OSA - no daytime AV block - encouraged use of BiPAP  Possible d/c home today after Dr. Haroldine Laws evaluates.  Cardiac Meds for Discharge Farxiga 10 mg daily  Digoxin 0.125 mg daily  Corlanor 5 mg bid Losartan 12.5 mg bid KCl 20 mEq bid Torsemide 60 mg daily  Spironolactone 25 mg daily   We will arrange [facility] f/u and will place appt infor in AVS.    Length of Stay: [address] PA-C  03/11/2020, 7:14 AM  Advanced Heart Failure Team Pager [phone] (M-F; 7a - 4p)  Please contact [facility] for night-coverage after hours (4p -7a ) and weekends on [email or web address]  Patient seen and examined with the above-signed Advanced Practice Provider and/or Housestaff. I personally reviewed laboratory data, imaging studies and relevant notes. I independently examined the patient and formulated the important aspects of the plan. I have edited the note to reflect any of my changes or salient  points. I have personally discussed the plan with the patient and/or family.  Looks and feels much better. Volume status optimized. Co-ox improved. Creatinine stable.   General:  Well appearing. No resp difficulty HEENT: normal Neck: supple. no JVD. Carotids 2+ bilat; no bruits. No lymphadenopathy or thryomegaly appreciated. Cor: PMI nondisplaced. Regular rate & rhythm. No rubs, gallops or murmurs. Lungs: clear Abdomen: obese soft, nontender, nondistended. No hepatosplenomegaly. No bruits or masses. Good bowel sounds. Extremities: no cyanosis, clubbing, rash, edema Neuro: alert & orientedx3, cranial nerves grossly intact. moves all 4 extremities w/o difficulty. Affect pleasant  Ok for d/c today on meds as above. Will need close f/u in HF Clinic. D/w Dr. Pietro Cassis.   Glori Bickers, MD  3:26 PM

## 2020-03-11 NOTE — Telephone Encounter (Signed)
Patient Advocate Encounter   Received notification from [facility] that prior authorization for Corlanor is required.   PA submitted via fax Status is pending   Will continue to follow.  Audry Riles, PharmD, BCPS, BCCP, CPP Heart Failure Clinic Pharmacist [phone]

## 2020-03-11 NOTE — Discharge Summary (Signed)
Physician Discharge Summary  Phillip [name] [facility]:[id number] DOB: 10-16-1985 DOA: 02/26/2020  PCP: Patient, No Pcp Per  Admit date: 02/26/2020 Discharge date: 03/11/2020  Admitted From: Home Discharge disposition: Home   Code Status: Full Code  Diet Recommendation: Cardiac/diabetic diet  Discharge Diagnosis:   Principal Problem:   Acute on chronic systolic (congestive) heart failure ([place]) Active Problems:   Morbid obesity with body mass index (BMI) of 60.0 to 69.9 in adult [facility])   Essential hypertension   GERD without esophagitis   Diuretic-induced hypokalemia   OSA (obstructive sleep apnea)   Biventricular heart failure ([name])   Chronic cough   Acute on chronic systolic congestive heart failure ([name])   Hypokalemia   History of Present Illness / Brief narrative:  Patient is a34 year old male with past medical history of biventricular congestive heart failure, ejection fraction 20%, obesity class III,likely undiagnosedobstructive sleep apnea, GERD and hidradenitissuppurativa.  Patient presented to the ED on 8/3 with worsening dyspnea on minimal exertion, orthopnea and edema.  Apparently, patient had been seen at [facility] system was being managed fornon ischemic cardiomyopathy with biventricular failure. Patient has not had ischemic work-up due to significant orthopnea. He is under consideration for ICD.   In the ED, he was tachypneic to 30s.  He had bilateral lower extremity pedal edema. Chest x-ray showed diffuse hazy opacification bilaterally suggestive of vascular congestion. Patient was admitted under hospitalist service for CHF exacerbation. Cardiology consultation was obtained.  Patient has remained on Lasix drip and milrinone drip. 8/10, patient underwent cardiac catheterization.   8/10, patient was also noted to be in hypercapnic respiratory failure.  Initiated on BiPAP, ultimately switched to trilogy device. See below for details.  Hospital Course:   Acute on chronic biventricular heart failure Nonischemic cardiomyopathy Essential hypertension -Presented with tachypnea, bilateral pedal edema and pulmonary edema -Echo in this admission with EF less than 20%, moderate LV dilatation, severely decreased RV function and severe RV dilatation. -Cardiac consult appreciated.  Patient was placed on Lasix drip and milrinone drip.  Currently off both drips. -Negative balance of 38 L since admission. -8/10, underwent cardiac catheterization, no coronary disease noted.  Nonischemic cardiomyopathy. -Heart failure medications at discharge adjusted and finalized by cardiology. Farxiga 10 mg daily  Digoxin 0.125 mg daily  Corlanor 5 mg bid Losartan 12.5 mg bid KCl 20 mEq bid Torsemide 60 mg daily  Spironolactone 25 mg daily   Acute hypercapnic hypoxemic respiratory failure Multifactorial: CHF exacerbation, morbid obesity, likely obesity hypoventilation syndrome -8/10, patient was noted to be lethargic.  Blood gas revealed a CO2 level elevated to 78.  -BiPAP was initiated on BiPAP but was very inconsistently compliant.   -He now has a trilogy machine arranged and is more compliant with it. -He has been less lethargic now.  He would not agree to blood gas for confirmation.  Hypokalemia/ hypomagnesemia: -Potassium and magnesium levels were monitored and replaced by cardiology.  Discharged on oral potassium as well.  New onset diabetes mellitus - A1c 6.8. -Start on sliding scale insulin with Accu-Cheks. -Diet control and exercise recommended. -Patient is not interested in Metformin.  GERD. Continue with pantoprazole.   Morbid obesity - Body mass index is 56.69 kg/m. Patient has been advised to make an attempt to improve diet and exercise patterns to aid in weight loss.  Wound care:    Subjective:  Seen and examined this morning.  [name] African-American male.  Lying on bed.  Not in distress.  Discharge Exam:   Vitals:  03/10/20  1042 03/10/20 1626 03/10/20 1955 03/10/20 2310  BP: (!) 121/52 (!) 152/105    Pulse: 88  92 92  Resp: 18 (!) 23  (!) 21  Temp: 98.4 F (36.9 C) 98.6 F (37 C) 98.6 F (37 C) 98.4 F (36.9 C)  TempSrc: Oral Oral Oral Oral  SpO2: 98%     Weight:      Height:        Body mass index is 56.69 kg/m.  General exam: Appears calm and comfortable.  Skin: No rashes, lesions or ulcers. HEENT: Atraumatic, normocephalic, supple neck, no obvious bleeding Lungs: Clear to auscultation bilaterally CVS: Regular rate and rhythm, no murmur GI/Abd soft, nontender, nondistended, bowel sound present CNS: Alert, awake, oriented x3 Psychiatry: Mood appropriate Extremities: Improving bilateral pedal edema, trace on both  Follow ups:   Discharge Instructions    AMB referral to CHF clinic   Complete by: As directed    Diet - low sodium heart healthy   Complete by: As directed    Increase activity slowly   Complete by: As directed       Follow-up Information    [facility] up on 04/10/2020.   Specialty: Cardiology Why: 10:00 AM [facility] Parking Garage Code 3220  Contact information: [address] [phone] [facility] up.   Contact information: [address] [place] [address] [phone]              Recommendations for Outpatient Follow-Up:   1. Follow-up with PCP as an outpatient 2. Follow-up with cardiology as an outpatient  Discharge Instructions:  Follow with Primary MD Patient, No Pcp Per in 7 days   Get CBC/CMP checked in next visit within 1 week by PCP or SNF MD ( we routinely change or add medications that can affect your baseline labs and fluid status, therefore we recommend that you get the mentioned basic workup next visit with your PCP, your PCP may decide not to get  them or add new tests based on their clinical decision)  On your next visit with your PCP, please Get Medicines reviewed and adjusted.  Please request your PCP  to go over all Hospital Tests and Procedure/Radiological results at the follow up, please get all Hospital records sent to your Prim MD by signing hospital release before you go home.  Activity: As tolerated with Full fall precautions use walker/cane & assistance as needed  For Heart failure patients - Check your Weight same time everyday, if you gain over 2 pounds, or you develop in leg swelling, experience more shortness of breath or chest pain, call your Primary MD immediately. Follow Cardiac Low Salt Diet and 1.5 lit/day fluid restriction.  If you have smoked or chewed Tobacco in the last 2 yrs please stop smoking, stop any regular Alcohol  and or any Recreational drug use.  If you experience worsening of your admission symptoms, develop shortness of breath, life threatening emergency, suicidal or homicidal thoughts you must seek medical attention immediately by calling 911 or calling your MD immediately  if symptoms less severe.  You Must read complete instructions/literature along with all the possible adverse reactions/side effects for all the Medicines you take and that have been prescribed to you. Take any new Medicines after you have completely understood and accpet all the possible adverse reactions/side effects.  Do not drive, operate heavy machinery, perform activities at heights, swimming or participation in water activities or provide baby sitting services if your were admitted for syncope or siezures until you have seen by Primary MD or a Neurologist and advised to do so again.  Do not drive when taking Pain medications.  Do not take more than prescribed Pain, Sleep and Anxiety Medications  Wear Seat belts while driving.   Please note You were cared for by a hospitalist during your hospital stay. If you have any  questions about your discharge medications or the care you received while you were in the hospital after you are discharged, you can call the unit and asked to speak with the hospitalist on call if the hospitalist that took care of you is not available. Once you are discharged, your primary care physician will handle any further medical issues. Please note that NO REFILLS for any discharge medications will be authorized once you are discharged, as it is imperative that you return to your primary care physician (or establish a relationship with a primary care physician if you do not have one) for your aftercare needs so that they can reassess your need for medications and monitor your lab values.    Allergies as of 03/11/2020   No Known Allergies     Medication List    STOP taking these medications   torsemide 20 MG tablet Commonly known as: DEMADEX     TAKE these medications   dapagliflozin propanediol 10 MG Tabs tablet Commonly known as: FARXIGA Take 1 tablet (10 mg total) by mouth daily. Start taking on: March 12, 2020   digoxin 0.125 MG tablet Commonly known as: LANOXIN Take 1 tablet (0.125 mg total) by mouth daily. Start taking on: March 12, 2020   ivabradine 5 MG Tabs tablet Commonly known as: CORLANOR Take 1 tablet (5 mg total) by mouth 2 (two) times daily with a meal.   losartan 25 MG tablet Commonly known as: COZAAR Take 0.5 tablets (12.5 mg total) by mouth 2 (two) times daily.   Potassium Chloride ER 20 MEQ Tbcr Take 20 mEq by mouth 2 (two) times daily.   spironolactone 25 MG tablet Commonly known as: ALDACTONE Take 1 tablet (25 mg total) by mouth daily. Start taking on: March 12, 2020 What changed: how much to take       Time coordinating discharge: 35 minutes  The results of significant diagnostics from this hospitalization (including imaging, microbiology, ancillary and laboratory) are listed below for reference.    Procedures and Diagnostic Studies:    DG Chest Portable 1 View  Result Date: 02/26/2020 CLINICAL DATA:  Cough and possible COVID EXAM: PORTABLE CHEST 1 VIEW COMPARISON:  02/18/2020 FINDINGS: Cardiopericardial enlargement. Diffuse hazy opacification of the chest which is likely vascular congestion and soft tissue attenuation. No visible effusion or pneumothorax IMPRESSION: Cardiomegaly and vascular congestion.  No focal pneumonia. Electronically Signed   By: Monte Fantasia M.D.   On: 02/26/2020 09:11   ECHOCARDIOGRAM COMPLETE  Result Date: 02/27/2020    ECHOCARDIOGRAM REPORT   Patient Name:   Phillip [name] Date of Exam: 02/27/2020 Medical Rec #:  [id number]      Height:       72.0 in Accession #:    [id number]     Weight:       454.8 lb Date of Birth:  04-Jul-1986       BSA:          3.021 m Patient Age:  33 years       BP:           125/99 mmHg Patient Gender: M              HR:           98 bpm. Exam Location:  Inpatient Procedure: 2D Echo and Intracardiac Opacification Agent Indications:    Dyspnea 786.09 / R06.00  History:        Patient has prior history of Echocardiogram examinations, most                 recent 05/12/2018. Risk Factors:Sleep Apnea and Former Smoker.                 Biventricular systolic heart failure, Biventricular systolic                 heart failure:.  Sonographer:    Darlina Sicilian RDCS Referring Phys: [id number] [place]  Sonographer Comments: Technically difficult study due to poor echo windows. IMPRESSIONS  1. No intracardiac thrombus seen with Definity contrast. Left ventricular ejection fraction, by estimation, is <20%. The left ventricle has severely decreased function. The left ventricle demonstrates global hypokinesis. The left ventricular internal cavity size was moderately to severely dilated. Left ventricular diastolic parameters are consistent with Grade II diastolic dysfunction (pseudonormalization). Elevated left atrial pressure.  2. Right ventricular systolic function is severely reduced. The  right ventricular size is severely enlarged. Tricuspid regurgitation signal is inadequate for assessing PA pressure.  3. Left atrial size was severely dilated.  4. Right atrial size was severely dilated.  5. The mitral valve is normal in structure. Mild mitral valve regurgitation.  6. The aortic valve is tricuspid. Aortic valve regurgitation is not visualized. No aortic stenosis is present.  7. The inferior vena cava is dilated in size with <50% respiratory variability, suggesting right atrial pressure of 15 mmHg. FINDINGS  Left Ventricle: No intracardiac thrombus seen with Definity contrast. Left ventricular ejection fraction, by estimation, is <20%. The left ventricle has severely decreased function. The left ventricle demonstrates global hypokinesis. Definity contrast agent was given IV to delineate the left ventricular endocardial borders. The left ventricular internal cavity size was moderately to severely dilated. There is no left ventricular hypertrophy. Left ventricular diastolic parameters are consistent with Grade II diastolic dysfunction (pseudonormalization). Elevated left atrial pressure. Right Ventricle: The right ventricular size is severely enlarged. No increase in right ventricular wall thickness. Right ventricular systolic function is severely reduced. Tricuspid regurgitation signal is inadequate for assessing PA pressure. Left Atrium: Left atrial size was severely dilated. Right Atrium: Right atrial size was severely dilated. Pericardium: There is no evidence of pericardial effusion. Mitral Valve: The mitral valve is normal in structure. Mild mitral valve regurgitation, with centrally-directed jet. Tricuspid Valve: The tricuspid valve is grossly normal. Tricuspid valve regurgitation is not demonstrated. Aortic Valve: The aortic valve is tricuspid. Aortic valve regurgitation is not visualized. No aortic stenosis is present. Pulmonic Valve: The pulmonic valve was normal in structure. Pulmonic valve  regurgitation is mild. Aorta: The aortic root and ascending aorta are structurally normal, with no evidence of dilitation. Venous: The inferior vena cava is dilated in size with less than 50% respiratory variability, suggesting right atrial pressure of 15 mmHg. IAS/Shunts: The interatrial septum was not well visualized.  LEFT VENTRICLE PLAX 2D LVIDd:         6.65 cm LVIDs:         5.75 cm LV PW:  1.10 cm LV IVS:        1.00 cm LVOT diam:     1.70 cm LV SV:         18 LV SV Index:   6 LVOT Area:     2.27 cm  LEFT ATRIUM           Index LA diam:      4.90 cm 1.62 cm/m LA Vol (A4C): 84.5 ml 27.97 ml/m  AORTIC VALVE LVOT Vmax:   54.10 cm/s LVOT Vmean:  42.300 cm/s LVOT VTI:    0.080 m  AORTA Ao Root diam: 2.50 cm MITRAL VALVE MV Area (PHT): 5.02 cm    SHUNTS MV Decel Time: 151 msec    Systemic VTI:  0.08 m MV E velocity: 68.90 cm/s  Systemic Diam: 1.70 cm Mihai Croitoru MD Electronically signed by Sanda Klein MD Signature Date/Time: 02/27/2020/3:46:27 PM    Final      Labs:   Basic Metabolic Panel: Recent Labs  Lab 03/07/20 0441 03/07/20 0441 03/08/20 0300 03/08/20 0300 03/09/20 0320 03/09/20 0320 03/10/20 0550 03/11/20 1015  NA 131*  --  128*  --  129*  --  134* 132*  K 3.6   < > 3.6   < > 3.6   < > 4.6 4.0  CL 84*  --  81*  --  84*  --  92* 93*  CO2 37*  --  36*  --  34*  --  32 30  GLUCOSE 120*  --  198*  --  230*  --  116* 120*  BUN 25*  --  23*  --  25*  --  22* 17  CREATININE 1.25*  --  1.20  --  1.21  --  1.11 1.03  CALCIUM 9.3  --  9.7  --  9.6  --  9.8 9.5  MG 2.2  --  2.2  --  2.3  --  2.4 2.2   < > = values in this interval not displayed.   GFR Estimated Creatinine Clearance: 173.3 mL/min (by C-G formula based on SCr of 1.03 mg/dL). Liver Function Tests: No results for input(s): AST, ALT, ALKPHOS, BILITOT, PROT, ALBUMIN in the last 168 hours. No results for input(s): LIPASE, AMYLASE in the last 168 hours. No results for input(s): AMMONIA in the last 168  hours. Coagulation profile No results for input(s): INR, PROTIME in the last 168 hours.  CBC: Recent Labs  Lab 03/07/20 0441 03/10/20 0550  WBC 9.8 10.2  HGB 13.6 15.1  HCT 43.1 48.2  MCV 88.3 88.9  PLT 273 288   Cardiac Enzymes: No results for input(s): CKTOTAL, CKMB, CKMBINDEX, TROPONINI in the last 168 hours. BNP: Invalid input(s): POCBNP CBG: Recent Labs  Lab 03/10/20 0614 03/10/20 1120 03/10/20 1630 03/11/20 0823 03/11/20 1131  GLUCAP 155* 115* 124* 176* 100*   D-Dimer No results for input(s): DDIMER in the last 72 hours. Hgb A1c No results for input(s): HGBA1C in the last 72 hours. Lipid Profile No results for input(s): CHOL, HDL, LDLCALC, TRIG, CHOLHDL, LDLDIRECT in the last 72 hours. Thyroid function studies No results for input(s): TSH, T4TOTAL, T3FREE, THYROIDAB in the last 72 hours.  Invalid input(s): FREET3 Anemia work up No results for input(s): VITAMINB12, FOLATE, FERRITIN, TIBC, IRON, RETICCTPCT in the last 72 hours. Microbiology No results found for this or any previous visit (from the past 240 hour(s)).   Signed: Terrilee Croak  [facility] 03/11/2020, 3:28 PM

## 2020-03-11 NOTE — TOC Progression Note (Addendum)
Transition of Care [facility]) - Progression Note    Patient Details  Name: Phillip [name] MRN: [id number] Date of Birth: June 27, 1986  Transition of Care [facility] CM/SW Contact  Zenon Mayo, RN Phone Number: 03/11/2020, 2:06 PM  Clinical Narrative:    NCM spoke with patient at bedside, he states he does not use oxygen at home.  He will be going home with NIV thru [employer].  He states he has transportation at [employer].  Zach with [employer] states the respiratory rep will make an apt to go by and set up the NIV at his home. Patient is for possible dc today.    Expected Discharge Plan: Home/Self Care Barriers to Discharge: Continued Medical Work up  Expected Discharge Plan and Services Expected Discharge Plan: Home/Self Care                         DME Arranged: NIV DME Agency: [employer] Date DME Agency Contacted: 03/05/20 Time DME Agency Contacted: [phone] Representative spoke with at DME Agency: [place] ([place]) Interventions    Readmission Risk Interventions No flowsheet data found.

## 2020-03-13 NOTE — Telephone Encounter (Signed)
Advanced Heart Failure Patient Advocate Encounter  Prior Authorization for Corlanor has been approved.    Effective dates: 03/12/20 through 03/12/21  Audry Riles, PharmD, BCPS, BCCP, CPP Heart Failure Clinic Pharmacist [phone]

## 2020-03-17 ENCOUNTER — Other Ambulatory Visit (HOSPITAL_COMMUNITY): Payer: Self-pay | Admitting: *Deleted

## 2020-03-17 ENCOUNTER — Telehealth (HOSPITAL_COMMUNITY): Payer: Self-pay | Admitting: Pharmacy Technician

## 2020-03-17 DIAGNOSIS — I5082 Biventricular heart failure: Secondary | ICD-10-CM | POA: Diagnosis not present

## 2020-03-17 DIAGNOSIS — I5023 Acute on chronic systolic (congestive) heart failure: Secondary | ICD-10-CM | POA: Diagnosis not present

## 2020-03-17 MED ORDER — IVABRADINE HCL 5 MG PO TABS: 5.0000 mg | ORAL_TABLET | Freq: Two times a day (BID) | ORAL | 0 refills | Status: DC

## 2020-03-17 NOTE — Telephone Encounter (Signed)
Received a notification that the patient's Corlanor was not going through at the pharmacy. Called and spoke with patient's insurance. Although the PA was approved, someone entered in the information incorrectly. That information has been corrected and now it will work at the pharmacy.  Co-pay on Corlanor is $0. Called and informed the patient. He would like to pick it up from [facility]. Asked for the script to be sent over.  Charlann Boxer, CPhT

## 2020-03-20 NOTE — Progress Notes (Addendum)
PCP: [facility] Primary Cardiologist: Dr Haroldine Laws HPI: 34 y.o. with history of cardiomyopathy and obesity.  He has been known to have a cardiomyopathy (biventricular failure) since back in 2019, echo in 2019 with EF 20% [facility]).  He has been followed in the [facility] system.   Admitted with increased A/C Systolic HF. Diuresed with IV lasix and transitioned to torsemide. Placed on milrinone and had RHC/LHC. Cath showed no CAD and volume overload.  Blood gas revealed a CO2 level elevated to 78. BiPAP was initiated. HF team recommended to d/c on Farxiga 10 mg daily , Digoxin 0.125 mg daily  Corlanor 5 mg bid, Losartan 12.5 mg bid, KCl 20 mEq bid, Torsemide 60 mg daily, and Spironolactone 25 mg daily  He was not discharged on torsemide and he was discharged on losartan 12.5 mg twice a day.  Discharge weight was 417 pounds.   Today he returns for post hospital follow up. Overall feeling ok. Wants to go back to work. Says he is limited by knee pain. Mild SOB with brisk walking. Denies PND/Orthopnea. Appetite ok. No fever or chills. Weight at home has gone up 10 pounds. Using Bipap. Taking all medications. 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    ECHO 02/27/2020 EF < 20%  03/04/2020 RHC/LHC:Dominance: Right Left Main  No significant disease.  Left Anterior Descending  Luminal irregularities.  Ramus Intermedius  Large, no significant disease.  Left Circumflex  Small, no significant disease.  Right Coronary Artery  No significant disease.  Intervention No interventions have been documented. Right Heart  Right Heart Pressures RHC Procedural Findings: Hemodynamics (mmHg) RA mean 19 RV 67/23 PA 71/20, mean 39 PCWP mean 23 LV 97/29 AO 95/60  Oxygen saturations: PA 61% AO 95%  Cardiac Output (Fick) 6.34  Cardiac Index (Fick) 2.1 PVR 2.5 WU    ROS: All systems negative except as listed in HPI, PMH and Problem List.  SH:  Social History   Socioeconomic History  .  Marital status: Unknown    Spouse name: Not on file  . Number of children: Not on file  . Years of education: Not on file  . Highest education level: Not on file  Occupational History  . Not on file  Tobacco Use  . Smoking status: Former Research scientist (life sciences)  . Smokeless tobacco: Former Systems developer  . Tobacco comment: quit in 2019  Substance and Sexual Activity  . Alcohol use: Never  . Drug use: Never  . Sexual activity: Not on file  Other Topics Concern  . Not on file  Social History Narrative  . Not on file   Social Determinants of Health   Financial Resource Strain:   . Difficulty of Paying Living Expenses: Not on file  Food Insecurity:   . Worried About Charity fundraiser in the Last Year: Not on file  . Ran Out of Food in the Last Year: Not on file  Transportation Needs:   . Lack of Transportation (Medical): Not on file  . Lack of Transportation (Non-Medical): Not on file  Physical Activity:   . Days of Exercise per Week: Not on file  . Minutes of Exercise per Session: Not on file  Stress:   . Feeling of Stress : Not on file  Social Connections:   . Frequency of Communication with Friends and Family: Not on file  . Frequency of Social Gatherings with Friends and Family: Not on file  . Attends Religious Services: Not on file  . Active  Member of Clubs or Organizations: Not on file  . Attends Archivist Meetings: Not on file  . Marital Status: Not on file  Intimate Partner Violence:   . Fear of Current or Ex-Partner: Not on file  . Emotionally Abused: Not on file  . Physically Abused: Not on file  . Sexually Abused: Not on file    FH:  Family History  Problem Relation Age of Onset  . Heart disease Mother   . Hypertension Mother   . Pulmonary Hypertension Mother   . Drug abuse Father        died due to Heroin overdose    Past Medical History:  Diagnosis Date  . Biventricular congestive heart failure ([place])    Last Echo 11/2019 at [facility] reveals EF 20%  . Class  3 severe obesity due to excess calories with serious comorbidity and body mass index (BMI) of 50.0 to 59.9 in adult ([place]) 02/26/2020  . Essential hypertension 02/26/2020  . GERD without esophagitis 02/26/2020  . Hidradenitis suppurativa 02/26/2020  . OSA (obstructive sleep apnea) 02/26/2020  . Prediabetes 02/26/2020    Current Outpatient Medications  Medication Sig Dispense Refill  . dapagliflozin propanediol (FARXIGA) 10 MG TABS tablet Take 1 tablet (10 mg total) by mouth daily. 30 tablet 0  . digoxin (LANOXIN) 0.125 MG tablet Take 1 tablet (0.125 mg total) by mouth daily. 30 tablet 0  . ivabradine (CORLANOR) 5 MG TABS tablet Take 1 tablet (5 mg total) by mouth 2 (two) times daily with a meal. 60 tablet 0  . losartan (COZAAR) 25 MG tablet Take 0.5 tablets (12.5 mg total) by mouth 2 (two) times daily. 30 tablet 0  . potassium chloride 20 MEQ TBCR Take 20 mEq by mouth 2 (two) times daily. 60 tablet 0  . spironolactone (ALDACTONE) 25 MG tablet Take 1 tablet (25 mg total) by mouth daily. 30 tablet 0   No current facility-administered medications for this encounter.    Vitals:   03/21/20 1044  BP: 130/90  Pulse: 87  SpO2: 94%  Weight: (!) 193.7 kg (427 lb)   Wt Readings from Last 3 Encounters:  03/21/20 (!) 193.7 kg  03/10/20 (!) 189.6 kg    PHYSICAL EXAM: General:  No resp difficulty HEENT: normal Neck: supple. JVP 11-12 . Carotids 2+ bilaterally; no bruits. No lymphadenopathy or thryomegaly appreciated. Cor: PMI normal. Regular rate & rhythm. No rubs, gallops or murmurs. Lungs: clear Abdomen: obese, soft, nontender, nondistended. No hepatosplenomegaly. No bruits or masses. Good bowel sounds. Extremities: no cyanosis, clubbing, rash, R and LLE 1-2+  edema Neuro: alert & orientedx3, cranial nerves grossly intact. Moves all 4 extremities w/o difficulty. Affect pleasant.   ECG: SR 90 bpm QRS 90 ms    ASSESSMENT & PLAN: 1. Chronic systolic CHF: Known cardiomyopathy since 2019. Echo this  admission with EF <20%, moderate LV dilation, severely decreased RV function with severe RV dilation, severe biatrial enlargement, mild MR. No history of ETOH/drugs. No FH of cardiomyopathy (mother with [place]). 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 Suspect he is too large for cardiac MRI. Narrow QRS on ECG so not CRT candidate. RHC/LHC on 8/10 with no CAD (nonischemic cardiomyopathy), elevated left and right heart filling pressures, and low CI at 2.1. Started on milrinone. Milrinone weaned and discontinued 8/15.  - NYHA II. - Volume status elevated not a surprise because he was not discharged on torsemide.  -Restart torsemide 60 mg once daily.  -  Hold Coreg for now w/ low output. Consider at next visit.   - Continue Ivabradine 5 mg bid.  - Continue digoxin 0.125 daily. Level ok on 8/11 (<0.2) - Continue spironolactone 25 mg daily  - Continue Losartan 12.5  bid. Eventual transition of Entresto if BP can tolerate.  - Continue Farxiga 10 mg daily. - Check BMET today and in 7 days.  - Plan to repeat ECHO in 3 months after HF meds optimized. Narrow QRS so no role for CRT  2. OSA: Hypercarbic respiratory failure in hospital.  - Encouraged to use Bipap nightly.    3. Type 2 Diabetes:  - Hgb A1c 6.8  - Started Farxiga 03/08/20 - Followed by primary care.    4. Recent AKI: Creatinine up to 1.5.  Suspect cardiorenal +/- contrast from cath this admit - Creatinine baseline 1.1 Check BMET today   5. Hyponatremia Check BMET   6. 2nd Degree AV Block, Type 2:  - In SR today   7. Obesity  Body mass index is 57.91 kg/m. Discussed portion control.   Follow up in 2 weeks with pharmacy and 4 weeks with APP and 3 months with Dr [name] and an ECHO.  Provided with a letter to return to work.   Milli Woolridge NP-C  11:13 AM

## 2020-03-21 ENCOUNTER — Ambulatory Visit (HOSPITAL_COMMUNITY)
Admit: 2020-03-21 | Discharge: 2020-03-21 | Disposition: A | Discharge status: Home / Self Care | Payer: BC Managed Care – PPO | Attending: Adult Health | Admitting: Adult Health

## 2020-03-21 ENCOUNTER — Other Ambulatory Visit: Payer: Self-pay

## 2020-03-21 VITALS — BP 130/90 | HR 87 | Wt >= 6400 oz

## 2020-03-21 DIAGNOSIS — G4733 Obstructive sleep apnea (adult) (pediatric): Secondary | ICD-10-CM | POA: Diagnosis not present

## 2020-03-21 DIAGNOSIS — Z7984 Long term (current) use of oral hypoglycemic drugs: Secondary | ICD-10-CM | POA: Insufficient documentation

## 2020-03-21 DIAGNOSIS — I429 Cardiomyopathy, unspecified: Secondary | ICD-10-CM | POA: Insufficient documentation

## 2020-03-21 DIAGNOSIS — E871 Hypo-osmolality and hyponatremia: Secondary | ICD-10-CM | POA: Insufficient documentation

## 2020-03-21 DIAGNOSIS — K219 Gastro-esophageal reflux disease without esophagitis: Secondary | ICD-10-CM | POA: Diagnosis not present

## 2020-03-21 DIAGNOSIS — I441 Atrioventricular block, second degree: Secondary | ICD-10-CM | POA: Insufficient documentation

## 2020-03-21 DIAGNOSIS — Z87891 Personal history of nicotine dependence: Secondary | ICD-10-CM | POA: Diagnosis not present

## 2020-03-21 DIAGNOSIS — Z79899 Other long term (current) drug therapy: Secondary | ICD-10-CM | POA: Diagnosis not present

## 2020-03-21 DIAGNOSIS — I5022 Chronic systolic (congestive) heart failure: Secondary | ICD-10-CM | POA: Insufficient documentation

## 2020-03-21 DIAGNOSIS — I509 Heart failure, unspecified: Secondary | ICD-10-CM | POA: Diagnosis not present

## 2020-03-21 DIAGNOSIS — Z8249 Family history of ischemic heart disease and other diseases of the circulatory system: Secondary | ICD-10-CM | POA: Diagnosis not present

## 2020-03-21 DIAGNOSIS — E119 Type 2 diabetes mellitus without complications: Secondary | ICD-10-CM | POA: Insufficient documentation

## 2020-03-21 DIAGNOSIS — I110 Hypertensive heart disease with heart failure: Secondary | ICD-10-CM | POA: Diagnosis not present

## 2020-03-21 DIAGNOSIS — Z6841 Body Mass Index (BMI) 40.0 and over, adult: Secondary | ICD-10-CM | POA: Diagnosis not present

## 2020-03-21 DIAGNOSIS — I5082 Biventricular heart failure: Secondary | ICD-10-CM | POA: Insufficient documentation

## 2020-03-21 LAB — BASIC METABOLIC PANEL
Anion gap: 15 (ref 5–15)
BUN: 12 mg/dL (ref 6–20)
CO2: 24 mmol/L (ref 22–32)
Calcium: 9.8 mg/dL (ref 8.9–10.3)
Chloride: 96 mmol/L — ABNORMAL LOW (ref 98–111)
Creatinine, Ser: 0.92 mg/dL (ref 0.61–1.24)
GFR calc Af Amer: >60 mL/min (ref >60)
GFR calc non Af Amer: >60 mL/min (ref >60)
Glucose, Bld: 107 mg/dL — ABNORMAL HIGH (ref 70–99)
Potassium: 3.9 mmol/L (ref 3.5–5.1)
Sodium: 135 mmol/L (ref 135–145)

## 2020-03-21 MED ORDER — TORSEMIDE 20 MG PO TABS
60.0000 mg | ORAL_TABLET | Freq: Two times a day (BID) | ORAL | 6 refills | Status: DC
Start: 2020-03-21 — End: 2020-04-03

## 2020-03-21 NOTE — Patient Instructions (Addendum)
Labs done today, your results will be available in MyChart, we will contact you for abnormal readings.  Repeat labs in: 1 week  START Torsemide 60mg  (3 tablets) Daily   Follow up with our APP clinic in: 2 weeks  Follow up with our Pharmacist in: 4 weeks  Follow up with our Physician with an echocardiogram in: 3 months  Your physician has requested that you have an echocardiogram. Echocardiography is a painless test that uses sound waves to create images of your heart. It provides your doctor with information about the size and shape of your heart and how well your heart's chambers and valves are working. This procedure takes approximately one hour. There are no restrictions for this procedure.   If you have any questions or concerns before your next appointment please send [place] a message through [place] or call our office at [phone].    TO LEAVE A MESSAGE FOR THE NURSE SELECT OPTION 2, PLEASE LEAVE A MESSAGE INCLUDING: . YOUR NAME . DATE OF BIRTH . CALL BACK NUMBER . REASON FOR CALL**this is important as we prioritize the call backs  [place] AS LONG AS YOU CALL BEFORE 4:00 PM   At the [facility], you and your health needs are our priority. As part of our continuing mission to provide you with exceptional heart care, we have created designated Provider Care Teams. These Care Teams include your primary Cardiologist (physician) and Advanced Practice Providers (APPs- Physician Assistants and Nurse Practitioners) who all work together to provide you with the care you need, when you need it.   You may see any of the following providers on your designated Care Team at your next follow up: Marland Kitchen Dr Glori Bickers . Dr [name] . Darrick Grinder, NP . Lyda Jester, PA . Audry Riles, PharmD   Please be sure to bring in all your medications bottles to every appointment.

## 2020-03-27 ENCOUNTER — Ambulatory Visit (HOSPITAL_COMMUNITY)
Admission: RE | Admit: 2020-03-27 | Discharge: 2020-03-27 | Disposition: A | Discharge status: Home / Self Care | Payer: BC Managed Care – PPO | Source: Ambulatory Visit | Attending: Internal Medicine | Admitting: Internal Medicine

## 2020-03-27 ENCOUNTER — Other Ambulatory Visit: Payer: Self-pay

## 2020-03-27 DIAGNOSIS — I509 Heart failure, unspecified: Secondary | ICD-10-CM | POA: Diagnosis not present

## 2020-03-27 LAB — BASIC METABOLIC PANEL
Anion gap: 12 (ref 5–15)
BUN: 11 mg/dL (ref 6–20)
CO2: 26 mmol/L (ref 22–32)
Calcium: 9.3 mg/dL (ref 8.9–10.3)
Chloride: 98 mmol/L (ref 98–111)
Creatinine, Ser: 0.96 mg/dL (ref 0.61–1.24)
GFR calc Af Amer: >60 mL/min (ref >60)
GFR calc non Af Amer: >60 mL/min (ref >60)
Glucose, Bld: 169 mg/dL — ABNORMAL HIGH (ref 70–99)
Potassium: 3.9 mmol/L (ref 3.5–5.1)
Sodium: 136 mmol/L (ref 135–145)

## 2020-04-03 ENCOUNTER — Telehealth (HOSPITAL_COMMUNITY): Payer: Self-pay | Admitting: Pharmacist

## 2020-04-03 MED ORDER — TORSEMIDE 20 MG PO TABS: 60.0000 mg | ORAL_TABLET | Freq: Every day | ORAL | 6 refills | Status: DC

## 2020-04-03 NOTE — Progress Notes (Signed)
PCP: [facility] Primary Cardiologist: Dr Haroldine Laws  HPI:  34 y.o.with history of cardiomyopathy and obesity. He has been known to have a cardiomyopathy (biventricular failure) since back in 2019, echo in 2019 with EF 20% [facility]). He has been followed in the [facility] system.   Admitted with increased A/C Systolic HF. Diuresed with IV lasix and transitioned to torsemide. Placed on milrinone and had RHC/LHC. Cath showed no CAD and volume overload.  Blood gas revealed a CO2 level elevated to 78. BiPAP was initiated. HF team recommended to discharge on Farxiga 10 mg daily , Digoxin 0.125 mg daily, Corlanor 5 mg bid, Losartan 12.5 mg BID, KCl 20 mEq BID, Torsemide 60 mg daily, and Spironolactone 25 mg daily. He was not discharged on torsemide and he was discharged on losartan 12.5 mg twice a day.  Discharge weight was 417 pounds.   Recently returned to HF Clinic for post hospital follow up on 03/21/20. Overall was feeling ok. Wanted to go back to work. Stated he was limited by knee pain. Mild SOB with brisk walking. Denied PND/Orthopnea. Appetite was ok. No fever or chills. Weight at home had gone up 10 pounds. Reported using his Bipap. Was taking all medications. 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   Today he returns to HF clinic for pharmacist medication titration. At last visit with APP, torsemide 60 mg daily was restarted.  Overall he is feeling well today. No dizziness, lightheadedness, chest pain or palpitations. He has to "breathe heavily" with mild-moderate activity but says he is not SOB. He can walk all the way around [employer] without being limited, which he was unable to do before. He takes torsemide 60 mg daily and has not needed any extra. His weight at home has been stable at 427 lbs. Weight in clinic was 435 lbs, but he was wearing heavy boots and wearing 3 large key rings around his neck. No LEE, PND or orthopnea.  States he legs sometimes swell at home, but this resolves with  torsemide. Drinks 2 L of water/juice per day. He follows a low salt diet. No issues affording medications, but did have difficulties getting his medications filled as they were last filled at [facility] and he couldn't get them transferred to [facility]. Because of this, he had been out of digoxin and spironolactone for 3 days. He was also taking losartan 25 mg daily instead of 12.5 mg BID.    HF Medications: Losartan 12.5 mg BID Spironolactone 25 mg daily Dapagliflozin 10 mg daily Ivabradine 5 mg BID Digoxin 0.125 mg daily Torsemide 60 mg daily Potassium chloride 20 mEq BID  Has the patient been experiencing any side effects to the medications prescribed?  no  Does the patient have any problems obtaining medications due to transportation or finances?   No - has Film/video editor.  Understanding of regimen: good Understanding of indications: good Potential of compliance: good Patient understands to avoid NSAIDs. Patient understands to avoid decongestants.    Pertinent Lab Values: . Serum creatinine 0.96, BUN 11, Potassium 3.9, Sodium 136  Vital Signs: . Weight: 435 lbs (last clinic weight: 427 lbs) . Blood pressure: 132/90  . Heart rate: 89   Assessment: 1. Chronic systolic CHF: Known cardiomyopathy since 2019. Echo this admission with EF <20%, moderate LV dilation, severely decreased RV function with severe RV dilation, severe biatrial enlargement, mild MR. No history of ETOH/drugs. No FH of cardiomyopathy (mother with [place]). 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 Suspect he is too large for cardiac MRI. Narrow QRS on ECG so not CRT candidate. RHC/LHC on 8/10 with no CAD (nonischemic cardiomyopathy), elevated left and right heart filling pressures, and low CI at 2.1. Started on milrinone. Milrinone weaned and discontinued8/15. - NYHA II, euvolemic on exam  - Continue torsemide 60 mg once daily.  - Hold  carvedilol for now with low output.  - Continue Ivabradine 5 mg bid.  - Continue digoxin 0.125 daily. Level ok on 03/05/20 (<0.2) - Continue spironolactone 25 mg daily  - Stop losartan and start Entresto 24/26 mg BID. Repeat BMET in 2 weeks.  - Continue Farxiga 10 mg daily. - Plan to repeat ECHO in 3 months after HF meds optimized. Narrow QRS so no role for CRT  2. OSA: Hypercarbic respiratory failure in hospital.  - Encouraged to use Bipap nightly.    3.Type 2Diabetes: - Hgb A1c 6.8 - Started Farxiga 03/08/20 - Followed by primary care.    4. Recent AKI: Creatinine up to 1.5. Suspect cardiorenal +/- contrast from cath this admit - Creatinine baseline 1.1  6. 2nd Degree AV Block, Type 2:  -In NSR today   7. Obesity  Body mass index is 57.91 kg/m. Discussed portion control.    Plan: 1) Medication changes: Based on clinical presentation, vital signs and recent labs will stop losartan and start Entresto 24/26 mg BID. Refills of all heart failure medications sent to [facility]. 2) Follow-up: 2 weeks with APP Clinic.    Audry Riles, PharmD, BCPS, BCCP, CPP Heart Failure Clinic Pharmacist [phone]

## 2020-04-03 NOTE — Telephone Encounter (Signed)
Spoke with Darrick Grinder, APP and patient should have been prescribed torsemide 60 mg daily instead of 60 mg BID. Called patient, he stated he had only been taking the 60 mg daily. I will update his torsemide to reflect correct dosage.   Audry Riles, PharmD, BCPS, BCCP, CPP Heart Failure Clinic Pharmacist [phone]

## 2020-04-07 DIAGNOSIS — J9610 Chronic respiratory failure, unspecified whether with hypoxia or hypercapnia: Secondary | ICD-10-CM | POA: Diagnosis not present

## 2020-04-10 ENCOUNTER — Encounter (HOSPITAL_COMMUNITY): Payer: BC Managed Care – PPO

## 2020-04-11 ENCOUNTER — Other Ambulatory Visit (HOSPITAL_COMMUNITY): Payer: Self-pay | Admitting: *Deleted

## 2020-04-16 ENCOUNTER — Ambulatory Visit (HOSPITAL_COMMUNITY)
Admission: RE | Admit: 2020-04-16 | Discharge: 2020-04-16 | Disposition: A | Discharge status: Home / Self Care | Payer: BC Managed Care – PPO | Source: Ambulatory Visit | Attending: Internal Medicine | Admitting: Internal Medicine

## 2020-04-16 ENCOUNTER — Other Ambulatory Visit: Payer: Self-pay

## 2020-04-16 VITALS — BP 132/90 | HR 89 | Wt >= 6400 oz

## 2020-04-16 DIAGNOSIS — Z7984 Long term (current) use of oral hypoglycemic drugs: Secondary | ICD-10-CM | POA: Diagnosis not present

## 2020-04-16 DIAGNOSIS — I5022 Chronic systolic (congestive) heart failure: Secondary | ICD-10-CM | POA: Insufficient documentation

## 2020-04-16 DIAGNOSIS — I5082 Biventricular heart failure: Secondary | ICD-10-CM | POA: Diagnosis not present

## 2020-04-16 DIAGNOSIS — I441 Atrioventricular block, second degree: Secondary | ICD-10-CM | POA: Diagnosis not present

## 2020-04-16 DIAGNOSIS — N179 Acute kidney failure, unspecified: Secondary | ICD-10-CM | POA: Insufficient documentation

## 2020-04-16 DIAGNOSIS — I509 Heart failure, unspecified: Secondary | ICD-10-CM

## 2020-04-16 DIAGNOSIS — Z6841 Body Mass Index (BMI) 40.0 and over, adult: Secondary | ICD-10-CM | POA: Insufficient documentation

## 2020-04-16 DIAGNOSIS — I429 Cardiomyopathy, unspecified: Secondary | ICD-10-CM | POA: Diagnosis not present

## 2020-04-16 DIAGNOSIS — G4733 Obstructive sleep apnea (adult) (pediatric): Secondary | ICD-10-CM | POA: Diagnosis not present

## 2020-04-16 DIAGNOSIS — Z9989 Dependence on other enabling machines and devices: Secondary | ICD-10-CM | POA: Diagnosis not present

## 2020-04-16 DIAGNOSIS — E669 Obesity, unspecified: Secondary | ICD-10-CM | POA: Insufficient documentation

## 2020-04-16 DIAGNOSIS — E119 Type 2 diabetes mellitus without complications: Secondary | ICD-10-CM | POA: Diagnosis not present

## 2020-04-16 DIAGNOSIS — Z79899 Other long term (current) drug therapy: Secondary | ICD-10-CM | POA: Insufficient documentation

## 2020-04-16 MED ORDER — DIGOXIN 125 MCG PO TABS: 0.1250 mg | ORAL_TABLET | Freq: Every day | ORAL | 3 refills | Status: DC

## 2020-04-16 MED ORDER — IVABRADINE HCL 5 MG PO TABS: 5.0000 mg | ORAL_TABLET | Freq: Two times a day (BID) | ORAL | 3 refills | Status: DC

## 2020-04-16 MED ORDER — SACUBITRIL-VALSARTAN 24-26 MG PO TABS: 1.0000 | ORAL_TABLET | Freq: Two times a day (BID) | ORAL | 3 refills | Status: DC

## 2020-04-16 MED ORDER — SPIRONOLACTONE 25 MG PO TABS: 25.0000 mg | ORAL_TABLET | Freq: Every day | ORAL | 0 refills | Status: DC

## 2020-04-16 MED ORDER — POTASSIUM CHLORIDE ER 20 MEQ PO TBCR: 20.0000 meq | EXTENDED_RELEASE_TABLET | Freq: Two times a day (BID) | ORAL | 3 refills | Status: DC

## 2020-04-16 MED ORDER — TORSEMIDE 20 MG PO TABS: 60.0000 mg | ORAL_TABLET | Freq: Every day | ORAL | 3 refills | Status: DC

## 2020-04-16 MED ORDER — DAPAGLIFLOZIN PROPANEDIOL 10 MG PO TABS: 10.0000 mg | ORAL_TABLET | Freq: Every day | ORAL | 3 refills | Status: DC

## 2020-04-16 NOTE — Patient Instructions (Addendum)
It was a pleasure seeing you today!  MEDICATIONS: -We are changing your medications today -Stop losartan -Start Entresto 24/26 mg (1 tablet) twice daily.  -Call if you have questions about your medications.   NEXT APPOINTMENT: Return to clinic in 2 weeks with APP Clinic.  In general, to take care of your heart failure: -Limit your fluid intake to 2 Liters (half-gallon) per day.   -Limit your salt intake to ideally 2-3 grams (2000-3000 mg) per day. -Weigh yourself daily and record, and bring that "weight diary" to your next appointment.  (Weight gain of 2-3 pounds in 1 day typically means fluid weight.) -The medications for your heart are to help your heart and help you live longer.   -Please contact us before stopping any of your heart medications.  Call the clinic at [phone] with questions or to reschedule future appointments.

## 2020-04-29 ENCOUNTER — Ambulatory Visit (HOSPITAL_COMMUNITY)
Admission: RE | Admit: 2020-04-29 | Discharge: 2020-04-29 | Disposition: A | Discharge status: Home / Self Care | Payer: BC Managed Care – PPO | Source: Ambulatory Visit | Attending: Adult Health | Admitting: Adult Health

## 2020-04-29 ENCOUNTER — Other Ambulatory Visit: Payer: Self-pay

## 2020-04-29 VITALS — BP 110/75 | HR 96 | Wt >= 6400 oz

## 2020-04-29 DIAGNOSIS — I5082 Biventricular heart failure: Secondary | ICD-10-CM | POA: Insufficient documentation

## 2020-04-29 DIAGNOSIS — Z8249 Family history of ischemic heart disease and other diseases of the circulatory system: Secondary | ICD-10-CM | POA: Insufficient documentation

## 2020-04-29 DIAGNOSIS — I110 Hypertensive heart disease with heart failure: Secondary | ICD-10-CM | POA: Diagnosis not present

## 2020-04-29 DIAGNOSIS — E119 Type 2 diabetes mellitus without complications: Secondary | ICD-10-CM | POA: Diagnosis not present

## 2020-04-29 DIAGNOSIS — G4733 Obstructive sleep apnea (adult) (pediatric): Secondary | ICD-10-CM | POA: Diagnosis not present

## 2020-04-29 DIAGNOSIS — I509 Heart failure, unspecified: Secondary | ICD-10-CM | POA: Diagnosis not present

## 2020-04-29 DIAGNOSIS — Z6841 Body Mass Index (BMI) 40.0 and over, adult: Secondary | ICD-10-CM | POA: Diagnosis not present

## 2020-04-29 DIAGNOSIS — E871 Hypo-osmolality and hyponatremia: Secondary | ICD-10-CM | POA: Insufficient documentation

## 2020-04-29 DIAGNOSIS — Z87891 Personal history of nicotine dependence: Secondary | ICD-10-CM | POA: Insufficient documentation

## 2020-04-29 DIAGNOSIS — R0602 Shortness of breath: Secondary | ICD-10-CM | POA: Diagnosis not present

## 2020-04-29 DIAGNOSIS — Z7984 Long term (current) use of oral hypoglycemic drugs: Secondary | ICD-10-CM | POA: Insufficient documentation

## 2020-04-29 DIAGNOSIS — I441 Atrioventricular block, second degree: Secondary | ICD-10-CM | POA: Diagnosis not present

## 2020-04-29 DIAGNOSIS — Z79899 Other long term (current) drug therapy: Secondary | ICD-10-CM | POA: Diagnosis not present

## 2020-04-29 DIAGNOSIS — I5022 Chronic systolic (congestive) heart failure: Secondary | ICD-10-CM

## 2020-04-29 LAB — BASIC METABOLIC PANEL
Anion gap: 11 (ref 5–15)
BUN: 8 mg/dL (ref 6–20)
CO2: 27 mmol/L (ref 22–32)
Calcium: 9 mg/dL (ref 8.9–10.3)
Chloride: 96 mmol/L — ABNORMAL LOW (ref 98–111)
Creatinine, Ser: 1.07 mg/dL (ref 0.61–1.24)
GFR calc non Af Amer: >60 mL/min (ref >60)
Glucose, Bld: 115 mg/dL — ABNORMAL HIGH (ref 70–99)
Potassium: 3.7 mmol/L (ref 3.5–5.1)
Sodium: 134 mmol/L — ABNORMAL LOW (ref 135–145)

## 2020-04-29 MED ORDER — CARVEDILOL 3.125 MG PO TABS
3.1250 mg | ORAL_TABLET | Freq: Two times a day (BID) | ORAL | 11 refills | Status: DC
Start: 2020-04-29 — End: 2020-05-22

## 2020-04-29 NOTE — Progress Notes (Signed)
PCP: [facility] Primary Cardiologist: Dr Haroldine Laws HPI: 34 y.o. with history of cardiomyopathy and obesity.  He has been known to have a cardiomyopathy (biventricular failure) since back in 2019, echo in 2019 with EF 20% [facility]).  He has been followed in the [facility] system.   Admitted with increased A/C Systolic HF. Diuresed with IV lasix and transitioned to torsemide. Placed on milrinone and had RHC/LHC. Cath showed no CAD and volume overload.  Blood gas revealed a CO2 level elevated to 78. BiPAP was initiated. HF team recommended to d/c on Farxiga 10 mg daily , Digoxin 0.125 mg daily  Corlanor 5 mg bid, Losartan 12.5 mg bid, KCl 20 mEq bid, Torsemide 60 mg daily, and Spironolactone 25 mg daily  He was not discharged on torsemide and he was discharged on losartan 12.5 mg twice a day.  Discharge weight was 417 pounds.   Today he returns for HF follow up. Last visit he was seen by pharmacy and started on entresto. Overall feeling much better.  Able to walk up steps. Mild SOB with steps. Denies PND/Orthopnea. Using Bipap nightly.  Appetite ok.Drinking lots of fluids. No fever or chills. Weight at home 419-432 pounds. Taking all medications. 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    ECHO 02/27/2020 EF < 20%  03/04/2020 RHC/LHC:Dominance: Right Left Main  No significant disease.  Left Anterior Descending  Luminal irregularities.  Ramus Intermedius  Large, no significant disease.  Left Circumflex  Small, no significant disease.  Right Coronary Artery  No significant disease.  Intervention No interventions have been documented. Right Heart  Right Heart Pressures RHC Procedural Findings: Hemodynamics (mmHg) RA mean 19 RV 67/23 PA 71/20, mean 39 PCWP mean 23 LV 97/29 AO 95/60  Oxygen saturations: PA 61% AO 95%  Cardiac Output (Fick) 6.34  Cardiac Index (Fick) 2.1 PVR 2.5 WU    ROS: All systems negative except as listed in HPI, PMH and Problem List.  SH:    Social History   Socioeconomic History  . Marital status: Unknown    Spouse name: Not on file  . Number of children: Not on file  . Years of education: Not on file  . Highest education level: Not on file  Occupational History  . Not on file  Tobacco Use  . Smoking status: Former Research scientist (life sciences)  . Smokeless tobacco: Former Systems developer  . Tobacco comment: quit in 2019  Substance and Sexual Activity  . Alcohol use: Never  . Drug use: Never  . Sexual activity: Not on file  Other Topics Concern  . Not on file  Social History Narrative  . Not on file   Social Determinants of Health   Financial Resource Strain:   . Difficulty of Paying Living Expenses: Not on file  Food Insecurity:   . Worried About Charity fundraiser in the Last Year: Not on file  . Ran Out of Food in the Last Year: Not on file  Transportation Needs:   . Lack of Transportation (Medical): Not on file  . Lack of Transportation (Non-Medical): Not on file  Physical Activity:   . Days of Exercise per Week: Not on file  . Minutes of Exercise per Session: Not on file  Stress:   . Feeling of Stress : Not on file  Social Connections:   . Frequency of Communication with Friends and Family: Not on file  . Frequency of Social Gatherings with Friends and Family: Not on file  . Attends  Religious Services: Not on file  . Active Member of Clubs or Organizations: Not on file  . Attends Archivist Meetings: Not on file  . Marital Status: Not on file  Intimate Partner Violence:   . Fear of Current or Ex-Partner: Not on file  . Emotionally Abused: Not on file  . Physically Abused: Not on file  . Sexually Abused: Not on file    FH:  Family History  Problem Relation Age of Onset  . Heart disease Mother   . Hypertension Mother   . Pulmonary Hypertension Mother   . Drug abuse Father        died due to Heroin overdose    Past Medical History:  Diagnosis Date  . Biventricular congestive heart failure ([place])    Last Echo  11/2019 at [facility] reveals EF 20%  . Class 3 severe obesity due to excess calories with serious comorbidity and body mass index (BMI) of 50.0 to 59.9 in adult ([place]) 02/26/2020  . Essential hypertension 02/26/2020  . GERD without esophagitis 02/26/2020  . Hidradenitis suppurativa 02/26/2020  . OSA (obstructive sleep apnea) 02/26/2020  . Prediabetes 02/26/2020    Current Outpatient Medications  Medication Sig Dispense Refill  . dapagliflozin propanediol (FARXIGA) 10 MG TABS tablet Take 1 tablet (10 mg total) by mouth daily. 90 tablet 3  . digoxin (LANOXIN) 0.125 MG tablet Take 1 tablet (0.125 mg total) by mouth daily. 90 tablet 3  . ivabradine (CORLANOR) 5 MG TABS tablet Take 1 tablet (5 mg total) by mouth 2 (two) times daily with a meal. 180 tablet 3  . Potassium Chloride ER 20 MEQ TBCR Take 20 mEq by mouth 2 (two) times daily. 180 tablet 3  . sacubitril-valsartan (ENTRESTO) 24-26 MG Take 1 tablet by mouth 2 (two) times daily. 180 tablet 3  . spironolactone (ALDACTONE) 25 MG tablet Take 1 tablet (25 mg total) by mouth daily. 90 tablet 0  . torsemide (DEMADEX) 20 MG tablet Take 3 tablets (60 mg total) by mouth daily. 270 tablet 3   No current facility-administered medications for this encounter.    Vitals:   04/29/20 1116  BP: 110/75  Pulse: 96  SpO2: 94%  Weight: (!) 194.2 kg (428 lb 3.2 oz)   Wt Readings from Last 3 Encounters:  04/29/20 (!) 194.2 kg (428 lb 3.2 oz)  04/16/20 (!) 197.3 kg (435 lb)  03/21/20 (!) 193.7 kg (427 lb)    PHYSICAL EXAM: General:  Well appearing. No resp difficulty HEENT: normal Neck: supple. no JVD. Carotids 2+ bilat; no bruits. No lymphadenopathy or thryomegaly appreciated. Cor: PMI nondisplaced. Regular rate & rhythm. No rubs, gallops or murmurs. Lungs: clear Abdomen: obese, soft, nontender, nondistended. No hepatosplenomegaly. No bruits or masses. Good bowel sounds. Extremities: no cyanosis, clubbing, rash, edema Neuro: alert & orientedx3, cranial  nerves grossly intact. moves all 4 extremities w/o difficulty. Affect pleasant  EKG: SR 91 bpm PR 156 ms QRS 84 ms  ASSESSMENT & PLAN: 1. Chronic systolic CHF: Known cardiomyopathy since 2019. Echo 02/27/2020 his admission with EF <20%, moderate LV dilation, severely decreased RV function with severe RV dilation, severe biatrial enlargement, mild MR. No history of ETOH/drugs. No FH of cardiomyopathy (mother with [place]). 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 Suspect he is too large for cardiac MRI. Narrow QRS on ECG so not CRT candidate. RHC/LHC on 8/10 with no CAD (nonischemic cardiomyopathy), elevated left and right heart filling pressures, and low CI at  2.1. Started on milrinone. Milrinone weaned and discontinued 8/15.  - NYHA II. Volume status stable. Continue  torsemide 60 mg once daily. Discussed limiting fluid intake to < 2 liter per day.  -Add coreg 3.125 mg twice a day.  EKG ok today.  - Continue Ivabradine 5 mg bid.  - Continue digoxin 0.125 daily. Level ok on 8/11 (<0.2) - Continue spironolactone 25 mg daily  - Continue entresto 24-26 mg twice a day.  - Continue Farxiga 10 mg daily. - Plan to repeat ECHO in November with Dr [name].  2. OSA: Hypercarbic respiratory failure in hospital.  -Continue Bipap every night.  3. Type 2 Diabetes:  - Hgb A1c 6.8  - Started Farxiga 03/08/20 - Followed by primary care.   4. Recent AKI: Creatinine up to 1.5.  Suspect cardiorenal +/- contrast from cath this admit - Creatinine baseline 1.1 5. Hyponatremia Check BMET 6. 2nd Degree AV Block, Type 2:  - In SR today  7. Obesity  Body mass index is 58.07 kg/m. Discussed portion control.   Check BMEt. Discussed medication changes.  Follow up with pharmacy in 3 weeks and 6 weeks with Dr [name] and an ECHO.   Cheresa Siers NP-C  11:19 AM

## 2020-04-29 NOTE — Patient Instructions (Signed)
START Coreg 3.125 mg, one tab twice a day  Labs today We will only contact you if something comes back abnormal or we need to make some changes. Otherwise no news is good news!  Your physician recommends that you schedule a follow-up appointment in: 3-4 weeks with the pharmacy team  If you have any questions or concerns before your next appointment please send [place] a message through [place] or call our office at [phone].    TO LEAVE A MESSAGE FOR THE NURSE SELECT OPTION 2, PLEASE LEAVE A MESSAGE INCLUDING: . YOUR NAME . DATE OF BIRTH . CALL BACK NUMBER . REASON FOR CALL**this is important as we prioritize the call backs  YOU WILL RECEIVE A CALL BACK THE SAME DAY AS LONG AS YOU CALL BEFORE 4:00 PM

## 2020-05-07 DIAGNOSIS — J9610 Chronic respiratory failure, unspecified whether with hypoxia or hypercapnia: Secondary | ICD-10-CM | POA: Diagnosis not present

## 2020-05-22 ENCOUNTER — Other Ambulatory Visit: Payer: Self-pay

## 2020-05-22 ENCOUNTER — Ambulatory Visit (HOSPITAL_COMMUNITY)
Admission: RE | Admit: 2020-05-22 | Discharge: 2020-05-22 | Disposition: A | Discharge status: Home / Self Care | Payer: BC Managed Care – PPO | Source: Ambulatory Visit | Attending: Cardiology | Admitting: Cardiology

## 2020-05-22 VITALS — BP 108/62 | Wt >= 6400 oz

## 2020-05-22 DIAGNOSIS — G4733 Obstructive sleep apnea (adult) (pediatric): Secondary | ICD-10-CM | POA: Diagnosis not present

## 2020-05-22 DIAGNOSIS — Z87448 Personal history of other diseases of urinary system: Secondary | ICD-10-CM | POA: Insufficient documentation

## 2020-05-22 DIAGNOSIS — I5022 Chronic systolic (congestive) heart failure: Secondary | ICD-10-CM | POA: Diagnosis not present

## 2020-05-22 DIAGNOSIS — I5082 Biventricular heart failure: Secondary | ICD-10-CM | POA: Insufficient documentation

## 2020-05-22 DIAGNOSIS — I441 Atrioventricular block, second degree: Secondary | ICD-10-CM | POA: Insufficient documentation

## 2020-05-22 DIAGNOSIS — Z7984 Long term (current) use of oral hypoglycemic drugs: Secondary | ICD-10-CM | POA: Insufficient documentation

## 2020-05-22 DIAGNOSIS — Z79899 Other long term (current) drug therapy: Secondary | ICD-10-CM | POA: Diagnosis not present

## 2020-05-22 DIAGNOSIS — Z5181 Encounter for therapeutic drug level monitoring: Secondary | ICD-10-CM | POA: Insufficient documentation

## 2020-05-22 DIAGNOSIS — Z9989 Dependence on other enabling machines and devices: Secondary | ICD-10-CM | POA: Insufficient documentation

## 2020-05-22 DIAGNOSIS — E119 Type 2 diabetes mellitus without complications: Secondary | ICD-10-CM | POA: Diagnosis not present

## 2020-05-22 DIAGNOSIS — I429 Cardiomyopathy, unspecified: Secondary | ICD-10-CM | POA: Diagnosis not present

## 2020-05-22 DIAGNOSIS — E669 Obesity, unspecified: Secondary | ICD-10-CM | POA: Diagnosis not present

## 2020-05-22 DIAGNOSIS — Z6841 Body Mass Index (BMI) 40.0 and over, adult: Secondary | ICD-10-CM | POA: Insufficient documentation

## 2020-05-22 MED ORDER — IVABRADINE HCL 7.5 MG PO TABS: 7.5000 mg | ORAL_TABLET | Freq: Two times a day (BID) | ORAL | 3 refills | Status: DC

## 2020-05-22 NOTE — Patient Instructions (Addendum)
It was a pleasure seeing you today!  MEDICATIONS: -We are changing your medications today  - STOP carvedilol -Increase ivabradine to 7.5 mg (1 tablet) twice daily. You may take 1.5 tablets of the 5 mg strength twice daily until you pick up the new dose.  -Call if you have questions about your medications.  NEXT APPOINTMENT: Return to clinic in 4 weeks with Dr. [name].  In general, to take care of your heart failure: -Limit your fluid intake to 2 Liters (half-gallon) per day.   -Limit your salt intake to ideally 2-3 grams (2000-3000 mg) per day. -Weigh yourself daily and record, and bring that "weight diary" to your next appointment.  (Weight gain of 2-3 pounds in 1 day typically means fluid weight.) -The medications for your heart are to help your heart and help you live longer.   -Please contact us before stopping any of your heart medications.  Call the clinic at [phone] with questions or to reschedule future appointments.

## 2020-05-22 NOTE — Progress Notes (Addendum)
PCP: [facility] Primary Cardiologist: Dr [name]  HPI:  34 y.o.with history of cardiomyopathy and obesity. He has been known to have a cardiomyopathy (biventricular failure) since back in 2019, echo in 2019 with EF 20% [facility]). He has been followed in the [facility] system.   Admitted with increased A/C Systolic HF. Diuresed with IV furosemide and transitioned to torsemide. Placed on milrinone and had RHC/LHC. Cath showed no CAD and volume overload.  Blood gas revealed a CO2 level elevated to 78. BiPAP was initiated. HF team recommended to d/c on Farxiga 10 mg daily, digoxin 0.125 mg daily,  Corlanor 5 mg BID, losartan 12.5 mg BID, KCl 20 mEq BID, torsemide 60 mg daily, and spironolactone 25 mg daily.  He was not discharged on torsemide but discharged on all other recommended meds.  Discharge weight was 417 pounds.   He returned to clinic for HF follow up with Darrick Grinder, NP, on 04/29/20. Overall was feeling much better. Able to walk up steps. Mild SOB with steps. Denied PND/Orthopnea. Was using Bipap nightly.  Appetite was ok. Drinking lots of fluids. No fever or chills. Weight at home 419-432 pounds. Taking all medications. 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  Today he returns to HF clinic for pharmacist medication titration. At last visit with Darrick Grinder, NP, on 04/29/20, carvedilol 3.125 mg BID was initiated. Overall he was feeling great until starting carvedilol. Since then he has had severe diarrhea and drowsiness. He noted some blood in his stool when straining. Additionally, complains of wheezing with deep breaths, and generally has been feeling ill. He reports dizziness and lightheadedness as well. No chest pain or palpitations. Breathing is difficult when overexerting. SOB when walking up hills or steps after about 5 minutes. Able to walk on flat ground. Weight at home has been stable at 425-435 pounds. Takes torsemide 60 mg daily and has not needed any extra. No  PND/Orthopnea recently. States he sometimes has bloating in abdomen and shoes fit tight, which he attributes to extra fluid, but this resolves with torsemide.  Appetite has been good. Patient has been eating more salt because he snacks on nuts and sunflower seeds to stay awake since starting carvedilol. Taking all medications as prescribed. No affordability issues.  HF Medications: Carvedilol 3.125 mg BID  Entresto 24/26 mg BID  Spironolactone 25 mg daily  Dapagliflozin 10 mg daily  Digoxin 0.125 mg daily Ivabradine 5 mg BID  Torsemide 60 mg daily  Potassium Chloride 20 mEq BID  Has the patient been experiencing any side effects to the medications prescribed?  Yes: drowsiness, diarrhea, and wheezing since carvedilol initiation.   Does the patient have any problems obtaining medications due to transportation or finances?  NO, BCBS Pharmacist, community  Understanding of regimen: Excellent Understanding of indications: Good Potential of compliance: Excellent Patient understands to avoid NSAIDs. Patient understands to avoid decongestants.    Pertinent Lab Values 04/29/20: Serum creatinine 1.07, BUN 8, Potassium 3.7, Sodium 134, BNP 368.5 (02/26/20)   Vital Signs: . Weight: 432.4 lbs (last clinic weight: 428 lbs) . Blood pressure: 108/62  . Heart rate: 92  Assessment:  1. Chronic systolic CHF: Known cardiomyopathy since 2019. Echo 02/27/2020 his admission with EF <20%, moderate LV dilation, severely decreased RV function with severe RV dilation, severe biatrial enlargement, mild MR. No history of ETOH/drugs. No FH of cardiomyopathy (mother with [place]). 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 Suspect he is too large  for cardiac MRI. Narrow QRS on ECG so not CRT candidate. RHC/LHC on 03/04/20 with no CAD (nonischemic cardiomyopathy), elevated left and right heart filling pressures, and low CI at 2.1. Started on milrinone.  Milrinone weaned and discontinued8/15/21. - NYHA II. Volume status stable on exam - Continue torsemide 60 mg once daily. - Discontinue carvedilol, patient is not tolerating carvedilol, has drowsiness, wheezing, and diarrhea. May consider bisoprolol in the future when patient can tolerate beta-blocker initiation. - Increase ivabradine to 7.5 mg BID. - Continue digoxin 0.125 mg daily - Continue Entresto 24-26 mg BID.  - Continue spironolactone 25 mg daily  - Continue dapagliflozin 10 mg daily. - Plan to repeat ECHO in November with Dr [name].   2. OSA: Hypercarbic respiratory failure in hospital.  -Continue Bipap every night.   3.Type 2Diabetes: - Hgb A1c 6.8 - Continue dapagliflozin 10 mg daily  - Followed by primary care.    4. Recent AKI: Creatinine up to 1.5. Suspect cardiorenal +/- contrast from cath this admit - Creatinine now at baseline of 1.1  6. 2nd Degree AV Block, Type 2   7. Obesity  Body mass index is 58.07 kg/m. Discussed portion control.    Plan: 1) Medication changes: Based on clinical presentation, vital signs and recent labs will discontinue carvedilol and increase ivabradine to 7.5 mg BID 2) Follow-up with Dr. [name] in 4 weeks   Audry Riles, PharmD, BCPS, BCCP, CPP Heart Failure Clinic Pharmacist [phone]

## 2020-06-07 DIAGNOSIS — J9610 Chronic respiratory failure, unspecified whether with hypoxia or hypercapnia: Secondary | ICD-10-CM | POA: Diagnosis not present

## 2020-06-17 ENCOUNTER — Other Ambulatory Visit: Payer: Self-pay

## 2020-06-17 ENCOUNTER — Ambulatory Visit (HOSPITAL_COMMUNITY)
Admission: RE | Admit: 2020-06-17 | Discharge: 2020-06-17 | Disposition: A | Discharge status: Home / Self Care | Payer: BC Managed Care – PPO | Source: Ambulatory Visit | Attending: Cardiology | Admitting: Cardiology

## 2020-06-17 ENCOUNTER — Ambulatory Visit (HOSPITAL_BASED_OUTPATIENT_CLINIC_OR_DEPARTMENT_OTHER)
Admission: RE | Admit: 2020-06-17 | Discharge: 2020-06-17 | Disposition: A | Discharge status: Home / Self Care | Payer: BC Managed Care – PPO | Source: Ambulatory Visit | Attending: Cardiology | Admitting: Cardiology

## 2020-06-17 ENCOUNTER — Encounter (HOSPITAL_COMMUNITY): Payer: Self-pay | Admitting: Cardiology

## 2020-06-17 VITALS — BP 96/62 | HR 88 | Wt >= 6400 oz

## 2020-06-17 DIAGNOSIS — K219 Gastro-esophageal reflux disease without esophagitis: Secondary | ICD-10-CM | POA: Insufficient documentation

## 2020-06-17 DIAGNOSIS — Z79899 Other long term (current) drug therapy: Secondary | ICD-10-CM | POA: Insufficient documentation

## 2020-06-17 DIAGNOSIS — Z87891 Personal history of nicotine dependence: Secondary | ICD-10-CM | POA: Diagnosis not present

## 2020-06-17 DIAGNOSIS — Z7984 Long term (current) use of oral hypoglycemic drugs: Secondary | ICD-10-CM | POA: Insufficient documentation

## 2020-06-17 DIAGNOSIS — Z6841 Body Mass Index (BMI) 40.0 and over, adult: Secondary | ICD-10-CM | POA: Diagnosis not present

## 2020-06-17 DIAGNOSIS — I5082 Biventricular heart failure: Secondary | ICD-10-CM | POA: Diagnosis not present

## 2020-06-17 DIAGNOSIS — G4733 Obstructive sleep apnea (adult) (pediatric): Secondary | ICD-10-CM | POA: Insufficient documentation

## 2020-06-17 DIAGNOSIS — R197 Diarrhea, unspecified: Secondary | ICD-10-CM | POA: Diagnosis not present

## 2020-06-17 DIAGNOSIS — I509 Heart failure, unspecified: Secondary | ICD-10-CM

## 2020-06-17 DIAGNOSIS — I5022 Chronic systolic (congestive) heart failure: Secondary | ICD-10-CM

## 2020-06-17 DIAGNOSIS — I110 Hypertensive heart disease with heart failure: Secondary | ICD-10-CM | POA: Diagnosis not present

## 2020-06-17 DIAGNOSIS — R0689 Other abnormalities of breathing: Secondary | ICD-10-CM | POA: Insufficient documentation

## 2020-06-17 DIAGNOSIS — Z8249 Family history of ischemic heart disease and other diseases of the circulatory system: Secondary | ICD-10-CM | POA: Insufficient documentation

## 2020-06-17 LAB — ECHOCARDIOGRAM COMPLETE
Area-P 1/2: 2.66 cm2
S' Lateral: 4.2 cm

## 2020-06-17 LAB — BASIC METABOLIC PANEL
Anion gap: 13 (ref 5–15)
BUN: 6 mg/dL (ref 6–20)
CO2: 28 mmol/L (ref 22–32)
Calcium: 9.4 mg/dL (ref 8.9–10.3)
Chloride: 96 mmol/L — ABNORMAL LOW (ref 98–111)
Creatinine, Ser: 0.85 mg/dL (ref 0.61–1.24)
GFR, Estimated: >60 mL/min (ref >60)
Glucose, Bld: 112 mg/dL — ABNORMAL HIGH (ref 70–99)
Potassium: 3.9 mmol/L (ref 3.5–5.1)
Sodium: 137 mmol/L (ref 135–145)

## 2020-06-17 LAB — DIGOXIN LEVEL: Digoxin Level: 0.2 ng/mL — ABNORMAL LOW (ref 1.0–2.0)

## 2020-06-17 MED ORDER — TORSEMIDE 20 MG PO TABS
80.0000 mg | ORAL_TABLET | Freq: Every day | ORAL | 3 refills | Status: DC
Start: 2020-06-17 — End: 2020-07-07

## 2020-06-17 NOTE — Progress Notes (Signed)
  Echocardiogram 2D Echocardiogram has been performed.  Phillip Young 06/17/2020, 12:02 PM

## 2020-06-17 NOTE — Progress Notes (Signed)
PCP: Patient, No Pcp Per Cardiology: Dr. [name]  HPI: 34 y.o. with history of cardiomyopathy and obesity.  He has been known to have a cardiomyopathy (biventricular failure) since back in 2019, echo in 2019 with EF 20% [facility]).  He has been followed in the [facility] system.  Admitted to [facility] in 7/16 with A/C Systolic HF. Diuresed with IV lasix and transitioned to torsemide. Placed on milrinone with low output and had RHC/LHC. Cath showed no CAD and volume overload.  Blood gas revealed a CO2 level elevated to 78. CPAP was initiated.   He was unable to tolerate Coreg due to wheezing and diarrhea.   Echo was done today and reviewed, EF 20-25%, RV poorly visualized.   Today he returns for HF follow up. Weight is up 23 lbs.  He admits to missing torsemide for 3 days over the weekend when he went on a trip.  He is short of breath walking up stairs and hill.  No dyspnea walking on flat ground.  He tries to use CPAP at night but does have some trouble with it.  +Orthopnea and bendopnea.  No PND.  No chest pain.  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 BP soft but no lightheadedness.   Labs (10/21): K 3.7, creatinine 1.07  ECG (personally reviewed): NSR, PVCs, right axis deviation, nonspecific T wave flattening.  PMH: 1. Hidradenitis suppuritiva 2. GERD 3. OSA: Suspected.  4. Diabetes 5. Morbid obesity 6. Fe deficiency anemia 7. Chronic systolic CHF: Nonischemic cardiomyopathy.  No history of ETOH or drugs.  +ANA.  No strong FH of cardiomyopathy.  - Echo (10/19, [facility]): EF 20%.  - Echo (5/21, [facility]): EF 20% - Echo (8/21): EF < 20%, moderate LV dilation, severely decreased RV function with severe RV dilation, severe biatrial enlargement, mild MR.  - LHC/RHC (8/21): No significant coronary disease; mean RA 19, PA 71/20 mean 39, mean PCWP 23, CI 2.1, PVR 2.5 WU.  - Echo (11/21): EF 20-25%, RV poorly visualized.  8. Prior smoker.   ROS: All systems negative except as  listed in HPI, PMH and Problem List.  Social History   Socioeconomic History  . Marital status: Unknown    Spouse name: Not on file  . Number of children: Not on file  . Years of education: Not on file  . Highest education level: Not on file  Occupational History  . Not on file  Tobacco Use  . Smoking status: Former Research scientist (life sciences)  . Smokeless tobacco: Former Systems developer  . Tobacco comment: quit in 2019  Substance and Sexual Activity  . Alcohol use: Never  . Drug use: Never  . Sexual activity: Not on file  Other Topics Concern  . Not on file  Social History Narrative  . Not on file   Social Determinants of Health   Financial Resource Strain:   . Difficulty of Paying Living Expenses: Not on file  Food Insecurity:   . Worried About Charity fundraiser in the Last Year: Not on file  . Ran Out of Food in the Last Year: Not on file  Transportation Needs:   . Lack of Transportation (Medical): Not on file  . Lack of Transportation (Non-Medical): Not on file  Physical Activity:   . Days of Exercise per Week: Not on file  . Minutes of Exercise per Session: Not on file  Stress:   . Feeling of Stress : Not on file  Social Connections:   . Frequency  of Communication with Friends and Family: Not on file  . Frequency of Social Gatherings with Friends and Family: Not on file  . Attends Religious Services: Not on file  . Active Member of Clubs or Organizations: Not on file  . Attends Archivist Meetings: Not on file  . Marital Status: Not on file  Intimate Partner Violence:   . Fear of Current or Ex-Partner: Not on file  . Emotionally Abused: Not on file  . Physically Abused: Not on file  . Sexually Abused: Not on file   Family History  Problem Relation Age of Onset  . Heart disease Mother   . Hypertension Mother   . Pulmonary Hypertension Mother   . Drug abuse Father        died due to Heroin overdose    Past Medical History:  Diagnosis Date  . Biventricular congestive  heart failure ([place])    Last Echo 11/2019 at [facility] reveals EF 20%  . Class 3 severe obesity due to excess calories with serious comorbidity and body mass index (BMI) of 50.0 to 59.9 in adult ([place]) 02/26/2020  . Essential hypertension 02/26/2020  . GERD without esophagitis 02/26/2020  . Hidradenitis suppurativa 02/26/2020  . OSA (obstructive sleep apnea) 02/26/2020  . Prediabetes 02/26/2020    Current Outpatient Medications  Medication Sig Dispense Refill  . dapagliflozin propanediol (FARXIGA) 10 MG TABS tablet Take 1 tablet (10 mg total) by mouth daily. 90 tablet 3  . digoxin (LANOXIN) 0.125 MG tablet Take 1 tablet (0.125 mg total) by mouth daily. 90 tablet 3  . ivabradine (CORLANOR) 7.5 MG TABS tablet Take 1 tablet (7.5 mg total) by mouth 2 (two) times daily with a meal. 180 tablet 3  . Potassium Chloride ER 20 MEQ TBCR Take 20 mEq by mouth 2 (two) times daily. 180 tablet 3  . sacubitril-valsartan (ENTRESTO) 24-26 MG Take 1 tablet by mouth 2 (two) times daily. 180 tablet 3  . spironolactone (ALDACTONE) 25 MG tablet Take 1 tablet (25 mg total) by mouth daily. 90 tablet 0  . torsemide (DEMADEX) 20 MG tablet Take 4 tablets (80 mg total) by mouth daily. 120 tablet 3   No current facility-administered medications for this encounter.    Vitals:   06/17/20 1215  BP: 96/62  Pulse: 88  SpO2: 90%  Weight: (!) 204.6 kg (451 lb)   Wt Readings from Last 3 Encounters:  06/17/20 (!) 204.6 kg (451 lb)  05/22/20 (!) 196.1 kg (432 lb 6.4 oz)  04/29/20 (!) 194.2 kg (428 lb 3.2 oz)    PHYSICAL EXAM: General: NAD, obese Neck: Thick, JVP 8-9 cm, no thyromegaly or thyroid nodule.  Lungs: Clear to auscultation bilaterally with normal respiratory effort. CV: Nondisplaced PMI.  Heart regular S1/S2, no S3/S4, no murmur.  1+ edema 3/4 to knees bilaterally.  No carotid bruit.  Normal pedal pulses.  Abdomen: Soft, nontender, no hepatosplenomegaly, no distention.  Skin: Intact without lesions or rashes.   Neurologic: Alert and oriented x 3.  Psych: Normal affect. Extremities: No clubbing or cyanosis.  HEENT: Normal.   ASSESSMENT & PLAN: 1. Chronic systolic CHF: Known cardiomyopathy since 2019. Echo 02/27/2020 his admission with EF <20%, moderate LV dilation, severely decreased RV function with severe RV dilation, severe biatrial enlargement, mild MR. No history of ETOH/drugs. No FH of cardiomyopathy (mother with [place]). 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 Suspect he is too large for cardiac MRI. Narrow QRS on ECG so not  CRT candidate. RHC/LHC on 03/04/20 with no CAD (nonischemic cardiomyopathy), elevated left and right heart filling pressures, and low CI at 2.1. Echo today showed EF 20-25%.  NYHA class II symptoms. He is volume overloaded after missing 3 days of torsemide and weight is up.   - Increase torsemide to 80 qam/40 qpm x 3 days then 80 mg daily. BMET today and in 10 days.  - He did not tolerate Coreg, will try to get him on bisoprolol when volume status better. .  - Continue Ivabradine 7.5 mg bid.  - Continue digoxin 0.125 daily. Check level today.  - Continue spironolactone 25 mg daily  - Continue entresto 24-26 mg twice a day, no BP room to increase today.  - Continue Farxiga 10 mg daily. - EF remains low on today's echo, I will refer to EP for evaluation for ICD.  2. OSA: Hypercarbic respiratory failure in hospital.  - Use OSA.  3. Obesity : Body mass index is 61.17 kg/m.  Imperative to lose weight.  - [facility]er to [facility].  4. I recommended that he get the COVID-19 vaccination.   Followup in 3 wks with APP to reassess volume status.    [name] 06/17/2020

## 2020-06-17 NOTE — Patient Instructions (Signed)
Increase Torsemide to 80 mg (4 tabs) in AM and 40 mg (2 tabs) in PM FOR 3 DAYS ONLY, then take:  Torsemide 80 mg (4 tabs) Daily  Labs done today, your results will be available in MyChart, we will contact you for abnormal readings.  Your physician recommends that you return for lab work in: 1-2 weeks  You have been referred to EP to discuss getting a defibrillator, they will call you for an appointment  You have been referred to [facility], they will call you to schedule an appointment  Your physician recommends that you schedule a follow-up appointment in: 3 weeks  If you have any questions or concerns before your next appointment please send [place] a message through [place] or call our office at [phone].    TO LEAVE A MESSAGE FOR THE NURSE SELECT OPTION 2, PLEASE LEAVE A MESSAGE INCLUDING: . YOUR NAME . DATE OF BIRTH . CALL BACK NUMBER . REASON FOR CALL**this is important as we prioritize the call backs  [place] AS LONG AS YOU CALL BEFORE 4:00 PM  At the [facility], you and your health needs are our priority. As part of our continuing mission to provide you with exceptional heart care, we have created designated Provider Care Teams. These Care Teams include your primary Cardiologist (physician) and Advanced Practice Providers (APPs- Physician Assistants and Nurse Practitioners) who all work together to provide you with the care you need, when you need it.   You may see any of the following providers on your designated Care Team at your next follow up: Marland Kitchen Dr Glori Bickers . Dr [name] . Darrick Grinder, NP . Lyda Jester, PA . Audry Riles, PharmD   Please be sure to bring in all your medications bottles to every appointment.

## 2020-06-24 ENCOUNTER — Other Ambulatory Visit: Payer: Self-pay

## 2020-06-24 ENCOUNTER — Encounter: Payer: Self-pay | Admitting: Cardiology

## 2020-06-24 ENCOUNTER — Ambulatory Visit (INDEPENDENT_AMBULATORY_CARE_PROVIDER_SITE_OTHER): Payer: BC Managed Care – PPO | Admitting: Cardiology

## 2020-06-24 VITALS — BP 92/60 | HR 87 | Ht 71.0 in | Wt >= 6400 oz

## 2020-06-24 DIAGNOSIS — I428 Other cardiomyopathies: Secondary | ICD-10-CM

## 2020-06-24 NOTE — Progress Notes (Signed)
Electrophysiology Office Note   Date:  06/24/2020   ID:  Phillip Young, DOB 06/06/1986, MRN [id number]  PCP:  Patient, No Pcp Per  Cardiologist: [name] Primary Electrophysiologist:  Marge Vandermeulen Meredith Leeds, MD    Chief Complaint: CHF   History of Present Illness: Phillip Young is a 34 y.o. male who is being seen today for the evaluation of CHF at the request of Larey Dresser, MD. Presenting today for electrophysiology evaluation.  He has a history of cardiomyopathy and obesity.  He has biventricular failure since 2019.  He was admitted to [facility] August 2021 with acute on chronic heart failure exacerbation.  He was placed on milrinone for low output heart failure.  Left heart catheterization showed no coronary artery disease.    Today, he denies symptoms of palpitations, chest pain, shortness of breath, orthopnea, PND, lower extremity edema, claudication, dizziness, presyncope, syncope, bleeding, or neurologic sequela. The patient is tolerating medications without difficulties.  He feels well today.  He does state that he gets short of breath when he misses doses of his torsemide, but otherwise has no acute complaints.   Past Medical History:  Diagnosis Date  . Biventricular congestive heart failure ([place])    Last Echo 11/2019 at [facility] reveals EF 20%  . Class 3 severe obesity due to excess calories with serious comorbidity and body mass index (BMI) of 50.0 to 59.9 in adult ([place]) 02/26/2020  . Essential hypertension 02/26/2020  . GERD without esophagitis 02/26/2020  . Hidradenitis suppurativa 02/26/2020  . OSA (obstructive sleep apnea) 02/26/2020  . Prediabetes 02/26/2020   Past Surgical History:  Procedure Laterality Date  . ABSCESS DRAINAGE    . RIGHT/LEFT HEART CATH AND CORONARY ANGIOGRAPHY N/A 03/04/2020   Procedure: RIGHT/LEFT HEART CATH AND CORONARY ANGIOGRAPHY;  Surgeon: Larey Dresser, MD;  Location: [facility];  Service: Cardiovascular;  Laterality: N/A;      Current Outpatient Medications  Medication Sig Dispense Refill  . dapagliflozin propanediol (FARXIGA) 10 MG TABS tablet Take 1 tablet (10 mg total) by mouth daily. 90 tablet 3  . digoxin (LANOXIN) 0.125 MG tablet Take 1 tablet (0.125 mg total) by mouth daily. 90 tablet 3  . ivabradine (CORLANOR) 7.5 MG TABS tablet Take 1 tablet (7.5 mg total) by mouth 2 (two) times daily with a meal. 180 tablet 3  . sacubitril-valsartan (ENTRESTO) 24-26 MG Take 1 tablet by mouth 2 (two) times daily. 180 tablet 3  . spironolactone (ALDACTONE) 25 MG tablet Take 1 tablet (25 mg total) by mouth daily. 90 tablet 0  . torsemide (DEMADEX) 20 MG tablet Take 4 tablets (80 mg total) by mouth daily. 120 tablet 3  . Potassium Chloride ER 20 MEQ TBCR Take 20 mEq by mouth 2 (two) times daily. 180 tablet 3   No current facility-administered medications for this visit.    Allergies:   Patient has no known allergies.   Social History:  The patient  reports that he has quit smoking. He has quit using smokeless tobacco. He reports that he does not drink alcohol and does not use drugs.   Family History:  The patient's family history includes Drug abuse in his father; Heart disease in his mother; Hypertension in his mother; Pulmonary Hypertension in his mother.    ROS:  Please see the history of present illness.   Otherwise, review of systems is positive for none.   All other systems are reviewed and negative.    PHYSICAL EXAM: VS:  BP 92/60   Pulse 87   Ht 5\' 11"  (1.803 m)   Wt (!) 446 lb 3.2 oz (202.4 kg)   SpO2 95%   BMI 62.23 kg/m  , BMI Body mass index is 62.23 kg/m. GEN: Well nourished, well developed, in no acute distress  HEENT: normal  Neck: no JVD, carotid bruits, or masses Cardiac: RRR; no murmurs, rubs, or gallops,no edema  Respiratory:  clear to auscultation bilaterally, normal work of breathing GI: soft, nontender, nondistended, + BS MS: no deformity or atrophy  Skin: warm and dry Neuro:   Strength and sensation are intact Psych: euthymic mood, full affect  EKG:  EKG is not ordered today. Personal review of the ekg ordered 06/17/20 shows sinus rhythm, right axis deviation, PVCs    Recent Labs: 02/26/2020: B Natriuretic Peptide 368.5 03/10/2020: Hemoglobin 15.1; Platelets 288 03/11/2020: Magnesium 2.2 06/17/2020: BUN 6; Creatinine, Ser 0.85; Potassium 3.9; Sodium 137    Lipid Panel  No results found for: CHOL, TRIG, HDL, CHOLHDL, VLDL, LDLCALC, LDLDIRECT   Wt Readings from Last 3 Encounters:  06/24/20 (!) 446 lb 3.2 oz (202.4 kg)  06/17/20 (!) 451 lb (204.6 kg)  05/22/20 (!) 432 lb 6.4 oz (196.1 kg)      Other studies Reviewed: Additional studies/ records that were reviewed today include: TTE 06/17/20  Review of the above records today demonstrates:  1. Left ventricular ejection fraction, by estimation, is 20 to 25%. The  left ventricle has severely decreased function. The left ventricle  demonstrates global hypokinesis. The left ventricular internal cavity size  was mildly dilated. Left ventricular  diastolic parameters are consistent with Grade II diastolic dysfunction  (pseudonormalization).  2. Right ventricular systolic function was not well visualized. The right  ventricular size is normal. Tricuspid regurgitation signal is inadequate  for assessing PA pressure.  3. Left atrial size was mildly dilated.  4. The mitral valve is normal in structure. No evidence of mitral valve  regurgitation. No evidence of mitral stenosis.  5. The aortic valve is tricuspid. Aortic valve regurgitation is not  visualized. No aortic stenosis is present.  6. The inferior vena cava is normal in size with <50% respiratory  variability, suggesting right atrial pressure of 8 mmHg.   Heart catheterization 03/04/2020 1. No significant CAD, nonischemic cardiomyopathy.  2. Filling pressures remain elevated.  3. Severe pulmonary venous hypertension.  4. Low but not markedly low  cardiac output.    ASSESSMENT AND PLAN:  1.  Chronic systolic heart failure due to nonischemic cardiomyopathy: Patient is currently on optimal medical therapy with Lisabeth Register, Aldactone.  He has not tolerated beta-blockers in the past.  He would likely benefit from an ICD, though his weight is a limiting factor.  He weighs 446 pounds today.  I am concerned about periprocedural complications.  I have asked him to continue to try to lose weight and if he can, would reconsider.  He would need to lose at least 75 to 100 pounds.  He understands this and Avrey Flanagin attempt to lose weight.  2.  Obstructive sleep apnea: CPAP compliance encouraged  Case discussed with primary cardiology  Current medicines are reviewed at length with the patient today.   The patient does not have concerns regarding his medicines.  The following changes were made today:  none  Labs/ tests ordered today include:  No orders of the defined types were placed in this encounter.    Disposition:   FU with Kalem Rockwell as needed  Signed, Elijio Staples  Meredith Leeds, MD  06/24/2020 12:29 PM     [facility] [address] [place] [address] [phone] (office) [phone] (fax)

## 2020-06-25 ENCOUNTER — Other Ambulatory Visit (HOSPITAL_COMMUNITY): Payer: BC Managed Care – PPO

## 2020-07-07 ENCOUNTER — Ambulatory Visit (HOSPITAL_COMMUNITY)
Admission: RE | Admit: 2020-07-07 | Discharge: 2020-07-07 | Disposition: A | Discharge status: Home / Self Care | Payer: BC Managed Care – PPO | Source: Ambulatory Visit | Attending: Adult Health | Admitting: Adult Health

## 2020-07-07 ENCOUNTER — Encounter (HOSPITAL_COMMUNITY): Payer: Self-pay

## 2020-07-07 ENCOUNTER — Other Ambulatory Visit: Payer: Self-pay

## 2020-07-07 VITALS — BP 126/88 | HR 83 | Wt >= 6400 oz

## 2020-07-07 DIAGNOSIS — Z79899 Other long term (current) drug therapy: Secondary | ICD-10-CM | POA: Diagnosis not present

## 2020-07-07 DIAGNOSIS — I5082 Biventricular heart failure: Secondary | ICD-10-CM | POA: Diagnosis not present

## 2020-07-07 DIAGNOSIS — G4733 Obstructive sleep apnea (adult) (pediatric): Secondary | ICD-10-CM | POA: Insufficient documentation

## 2020-07-07 DIAGNOSIS — I110 Hypertensive heart disease with heart failure: Secondary | ICD-10-CM | POA: Insufficient documentation

## 2020-07-07 DIAGNOSIS — Z6841 Body Mass Index (BMI) 40.0 and over, adult: Secondary | ICD-10-CM | POA: Diagnosis not present

## 2020-07-07 DIAGNOSIS — Z9989 Dependence on other enabling machines and devices: Secondary | ICD-10-CM | POA: Insufficient documentation

## 2020-07-07 DIAGNOSIS — J9610 Chronic respiratory failure, unspecified whether with hypoxia or hypercapnia: Secondary | ICD-10-CM | POA: Diagnosis not present

## 2020-07-07 DIAGNOSIS — I428 Other cardiomyopathies: Secondary | ICD-10-CM | POA: Diagnosis not present

## 2020-07-07 DIAGNOSIS — I493 Ventricular premature depolarization: Secondary | ICD-10-CM | POA: Insufficient documentation

## 2020-07-07 DIAGNOSIS — I420 Dilated cardiomyopathy: Secondary | ICD-10-CM | POA: Diagnosis not present

## 2020-07-07 DIAGNOSIS — Z87891 Personal history of nicotine dependence: Secondary | ICD-10-CM | POA: Diagnosis not present

## 2020-07-07 DIAGNOSIS — Z7984 Long term (current) use of oral hypoglycemic drugs: Secondary | ICD-10-CM | POA: Insufficient documentation

## 2020-07-07 DIAGNOSIS — I5022 Chronic systolic (congestive) heart failure: Secondary | ICD-10-CM

## 2020-07-07 MED ORDER — ENTRESTO 49-51 MG PO TABS
1.0000 | ORAL_TABLET | Freq: Two times a day (BID) | ORAL | 3 refills | Status: DC
Start: 2020-07-07 — End: 2020-10-02

## 2020-07-07 NOTE — Progress Notes (Signed)
PCP: Patient, No Pcp Per Cardiology: Dr. [name]  HPI: 34 y.o. with history of cardiomyopathy and obesity.  He has been known to have a cardiomyopathy (biventricular failure) since back in 2019, echo in 2019 with EF 20% [facility]).  He has been followed in the [facility] system.  Admitted to [facility] in 1/44 with A/C Systolic HF. Diuresed with IV lasix and transitioned to torsemide. Placed on milrinone with low output and had RHC/LHC. Cath showed no CAD and volume overload.  Blood gas revealed a CO2 level elevated to 78. CPAP was initiated.   He was unable to tolerate Coreg due to wheezing and diarrhea.   06/17/2020 Echo EF 20-25%, RV poorly visualized.   Today he returns for HF follow up.Overall feeling fine. SOB with inclines but can walk on flat surfaces. .  Denies PND/Orthopnea. Using CPAP. Drinks lots of fluids. Appetite ok. No fever or chills. Weight at home 440  pounds. Taking all medications. 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   Labs (10/21): K 3.7, creatinine 1.07  ECG (personally reviewed): NSR, PVCs, right axis deviation, nonspecific T wave flattening.  PMH: 1. Hidradenitis suppuritiva 2. GERD 3. OSA: Suspected.  4. Diabetes 5. Morbid obesity 6. Fe deficiency anemia 7. Chronic systolic CHF: Nonischemic cardiomyopathy.  No history of ETOH or drugs.  +ANA.  No strong FH of cardiomyopathy.  - Echo (10/19, [facility]): EF 20%.  - Echo (5/21, [facility]): EF 20% - Echo (8/21): EF < 20%, moderate LV dilation, severely decreased RV function with severe RV dilation, severe biatrial enlargement, mild MR.  - Echo 06/17/2020 EF  - LHC/RHC (8/21): No significant coronary disease; mean RA 19, PA 71/20 mean 39, mean PCWP 23, CI 2.1, PVR 2.5 WU.  - Echo (11/21): EF 20-25%, RV poorly visualized.  8. Prior smoker.   ROS: All systems negative except as listed in HPI, PMH and Problem List.  Social History   Socioeconomic History  . Marital status: Unknown    Spouse  name: Not on file  . Number of children: Not on file  . Years of education: Not on file  . Highest education level: Not on file  Occupational History  . Not on file  Tobacco Use  . Smoking status: Former Research scientist (life sciences)  . Smokeless tobacco: Former Systems developer  . Tobacco comment: quit in 2019  Substance and Sexual Activity  . Alcohol use: Never  . Drug use: Never  . Sexual activity: Not on file  Other Topics Concern  . Not on file  Social History Narrative  . Not on file   Social Determinants of Health   Financial Resource Strain: Not on file  Food Insecurity: Not on file  Transportation Needs: Not on file  Physical Activity: Not on file  Stress: Not on file  Social Connections: Not on file  Intimate Partner Violence: Not on file   Family History  Problem Relation Age of Onset  . Heart disease Mother   . Hypertension Mother   . Pulmonary Hypertension Mother   . Drug abuse Father        died due to Heroin overdose    Past Medical History:  Diagnosis Date  . Biventricular congestive heart failure ([place])    Last Echo 11/2019 at [facility] reveals EF 20%  . Class 3 severe obesity due to excess calories with serious comorbidity and body mass index (BMI) of 50.0 to 59.9 in adult ([place]) 02/26/2020  . Essential hypertension 02/26/2020  .  GERD without esophagitis 02/26/2020  . Hidradenitis suppurativa 02/26/2020  . OSA (obstructive sleep apnea) 02/26/2020  . Prediabetes 02/26/2020    Current Outpatient Medications  Medication Sig Dispense Refill  . dapagliflozin propanediol (FARXIGA) 10 MG TABS tablet Take 1 tablet (10 mg total) by mouth daily. 90 tablet 3  . digoxin (LANOXIN) 0.125 MG tablet Take 1 tablet (0.125 mg total) by mouth daily. 90 tablet 3  . ivabradine (CORLANOR) 7.5 MG TABS tablet Take 1 tablet (7.5 mg total) by mouth 2 (two) times daily with a meal. 180 tablet 3  . Potassium Chloride ER 20 MEQ TBCR Take 20 mEq by mouth 2 (two) times daily. 180 tablet 3  . spironolactone (ALDACTONE) 25  MG tablet Take 1 tablet (25 mg total) by mouth daily. 90 tablet 0  . torsemide (DEMADEX) 20 MG tablet Take 60 mg by mouth daily.    . sacubitril-valsartan (ENTRESTO) 49-51 MG Take 1 tablet by mouth 2 (two) times daily. 60 tablet 3   No current facility-administered medications for this encounter.    Vitals:   07/07/20 1520  BP: 126/88  Pulse: 83  SpO2: 93%  Weight: (!) 204.3 kg (450 lb 6.4 oz)   Wt Readings from Last 3 Encounters:  07/07/20 (!) 204.3 kg (450 lb 6.4 oz)  06/24/20 (!) 202.4 kg (446 lb 3.2 oz)  06/17/20 (!) 204.6 kg (451 lb)    PHYSICAL EXAM: General:  Well appearing. No resp difficulty HEENT: normal Neck: supple. no JVD. Carotids 2+ bilat; no bruits. No lymphadenopathy or thryomegaly appreciated. Cor: PMI nondisplaced. Regular rate & rhythm. No rubs, gallops or murmurs. Lungs: clear Abdomen: obese, soft, nontender, nondistended. No hepatosplenomegaly. No bruits or masses. Good bowel sounds. Extremities: no cyanosis, clubbing, rash, edema Neuro: alert & orientedx3, cranial nerves grossly intact. moves all 4 extremities w/o difficulty. Affect pleasant    ASSESSMENT & PLAN: 1. Chronic systolic CHF: Known cardiomyopathy since 2019. Echo 02/27/2020 his admission with EF <20%, moderate LV dilation, severely decreased RV function with severe RV dilation, severe biatrial enlargement, mild MR. No history of ETOH/drugs. No FH of cardiomyopathy (mother with [place]). 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 Suspect he is too large for cardiac MRI. Narrow QRS on ECG so not CRT candidate. RHC/LHC on 03/04/20 with no CAD (nonischemic cardiomyopathy), elevated left and right heart filling pressures, and low CI at 2.1.  05/2020 Echo  showed EF 20-25%. Saw EP but needs to lose weight for ICD.   NYHA class II -III. Volume status stable.  - He did not tolerate Coreg- Updated allergies.  - Continue Ivabradine 7.5 mg bid.  -  Continue digoxin 0.125 daily. Check level today.  - Continue spironolactone 25 mg daily  - Increaes entresto 49-51 mg twice a day.  Check  BMET in 7 days  - Continue Farxiga 10 mg daily. 2. OSA: Hypercarbic respiratory failure in hospital.  - Use OSA. Continue nightly.  3. Obesity : Body mass index is 62.82 kg/m.  Imperative to lose weight.  - [facility] to [facility].   Follow up in 8 weeks. Consider increase entresto at tat time.    Greater than 50% of the (total minutes 25) visit spent in counseling/coordination of care regarding the above plan and medication changes.    Warren Lacy Kaelin Holford  NP-C  07/07/2020

## 2020-07-07 NOTE — Patient Instructions (Addendum)
INCREASE Entresto 49/51mg  (1 tablet) twice daily  Your physician recommends that you schedule a lab appointment in 1 week  Your physician recommends that you schedule a follow-up appointment in: 8 weeks  If you have any questions or concerns before your next appointment please send [place] a message through [place] or call our office at [phone].    TO LEAVE A MESSAGE FOR THE NURSE SELECT OPTION 2, PLEASE LEAVE A MESSAGE INCLUDING: . YOUR NAME . DATE OF BIRTH . CALL BACK NUMBER . REASON FOR CALL**this is important as we prioritize the call backs  YOU WILL RECEIVE A CALL BACK THE SAME DAY AS LONG AS YOU CALL BEFORE 4:00 PM

## 2020-07-12 ENCOUNTER — Other Ambulatory Visit (HOSPITAL_COMMUNITY): Payer: Self-pay | Admitting: Internal Medicine

## 2020-07-14 ENCOUNTER — Other Ambulatory Visit (HOSPITAL_COMMUNITY): Payer: BC Managed Care – PPO

## 2020-07-28 ENCOUNTER — Encounter (HOSPITAL_COMMUNITY): Payer: Self-pay | Admitting: Emergency Medicine

## 2020-07-28 ENCOUNTER — Emergency Department (HOSPITAL_COMMUNITY)
Admission: EM | Admit: 2020-07-28 | Discharge: 2020-07-29 | Disposition: A | Discharge status: Home / Self Care | Payer: BC Managed Care – PPO | Attending: Emergency Medicine | Admitting: Emergency Medicine

## 2020-07-28 DIAGNOSIS — I517 Cardiomegaly: Secondary | ICD-10-CM | POA: Diagnosis not present

## 2020-07-28 DIAGNOSIS — Z87891 Personal history of nicotine dependence: Secondary | ICD-10-CM | POA: Insufficient documentation

## 2020-07-28 DIAGNOSIS — Z79899 Other long term (current) drug therapy: Secondary | ICD-10-CM | POA: Diagnosis not present

## 2020-07-28 DIAGNOSIS — R0602 Shortness of breath: Secondary | ICD-10-CM | POA: Diagnosis not present

## 2020-07-28 DIAGNOSIS — R079 Chest pain, unspecified: Secondary | ICD-10-CM | POA: Diagnosis not present

## 2020-07-28 DIAGNOSIS — U071 COVID-19: Secondary | ICD-10-CM

## 2020-07-28 DIAGNOSIS — I110 Hypertensive heart disease with heart failure: Secondary | ICD-10-CM | POA: Diagnosis not present

## 2020-07-28 DIAGNOSIS — I5082 Biventricular heart failure: Secondary | ICD-10-CM | POA: Diagnosis not present

## 2020-07-28 DIAGNOSIS — R059 Cough, unspecified: Secondary | ICD-10-CM | POA: Diagnosis not present

## 2020-07-28 DIAGNOSIS — R519 Headache, unspecified: Secondary | ICD-10-CM | POA: Diagnosis not present

## 2020-07-28 LAB — RESP PANEL BY RT-PCR (FLU A&B, COVID) ARPGX2
Influenza A by PCR: NEGATIVE
Influenza B by PCR: NEGATIVE
SARS Coronavirus 2 by RT PCR: POSITIVE — AB

## 2020-07-28 NOTE — ED Triage Notes (Signed)
Pt reports he was around his mother yesterday who got diagnosed with covid, now having lower back pain, cough and nasal congestion.

## 2020-07-29 ENCOUNTER — Emergency Department (HOSPITAL_COMMUNITY): Payer: BC Managed Care – PPO

## 2020-07-29 DIAGNOSIS — R059 Cough, unspecified: Secondary | ICD-10-CM | POA: Diagnosis not present

## 2020-07-29 DIAGNOSIS — I517 Cardiomegaly: Secondary | ICD-10-CM | POA: Diagnosis not present

## 2020-07-29 LAB — COMPREHENSIVE METABOLIC PANEL
ALT: 32 U/L (ref 0–44)
AST: 38 U/L (ref 15–41)
Albumin: 3.3 g/dL — ABNORMAL LOW (ref 3.5–5.0)
Alkaline Phosphatase: 60 U/L (ref 38–126)
Anion gap: 12 (ref 5–15)
BUN: 11 mg/dL (ref 6–20)
CO2: 27 mmol/L (ref 22–32)
Calcium: 8.9 mg/dL (ref 8.9–10.3)
Chloride: 93 mmol/L — ABNORMAL LOW (ref 98–111)
Creatinine, Ser: 1.02 mg/dL (ref 0.61–1.24)
GFR, Estimated: >60 mL/min (ref >60)
Glucose, Bld: 121 mg/dL — ABNORMAL HIGH (ref 70–99)
Potassium: 3.6 mmol/L (ref 3.5–5.1)
Sodium: 132 mmol/L — ABNORMAL LOW (ref 135–145)
Total Bilirubin: 0.6 mg/dL (ref 0.3–1.2)
Total Protein: 7.8 g/dL (ref 6.5–8.1)

## 2020-07-29 LAB — CBC WITH DIFFERENTIAL/PLATELET
Abs Immature Granulocytes: 0.03 10*3/uL (ref 0.00–0.07)
Basophils Absolute: 0 10*3/uL (ref 0.0–0.1)
Basophils Relative: 1 %
Eosinophils Absolute: 0.1 10*3/uL (ref 0.0–0.5)
Eosinophils Relative: 1 %
HCT: 43.6 % (ref 39.0–52.0)
Hemoglobin: 14.2 g/dL (ref 13.0–17.0)
Immature Granulocytes: 1 %
Lymphocytes Relative: 15 %
Lymphs Abs: 1 10*3/uL (ref 0.7–4.0)
MCH: 30.6 pg (ref 26.0–34.0)
MCHC: 32.6 g/dL (ref 30.0–36.0)
MCV: 94 fL (ref 80.0–100.0)
Monocytes Absolute: 0.7 10*3/uL (ref 0.1–1.0)
Monocytes Relative: 10 %
Neutro Abs: 4.6 10*3/uL (ref 1.7–7.7)
Neutrophils Relative %: 72 %
Platelets: 249 10*3/uL (ref 150–400)
RBC: 4.64 MIL/uL (ref 4.22–5.81)
RDW: 12.5 % (ref 11.5–15.5)
WBC: 6.3 10*3/uL (ref 4.0–10.5)
nRBC: 0 % (ref 0.0–0.2)

## 2020-07-29 LAB — LACTATE DEHYDROGENASE: LDH: 192 U/L (ref 98–192)

## 2020-07-29 LAB — PROCALCITONIN: Procalcitonin: <0.1 ng/mL

## 2020-07-29 LAB — C-REACTIVE PROTEIN: CRP: 1.4 mg/dL — ABNORMAL HIGH (ref <1.0)

## 2020-07-29 LAB — FERRITIN: Ferritin: 101 ng/mL (ref 24–336)

## 2020-07-29 LAB — LACTIC ACID, PLASMA: Lactic Acid, Venous: 2.1 mmol/L (ref 0.5–1.9)

## 2020-07-29 LAB — TRIGLYCERIDES: Triglycerides: 137 mg/dL (ref <150)

## 2020-07-29 LAB — FIBRINOGEN: Fibrinogen: 514 mg/dL — ABNORMAL HIGH (ref 210–475)

## 2020-07-29 LAB — BRAIN NATRIURETIC PEPTIDE: B Natriuretic Peptide: 27.9 pg/mL (ref 0.0–100.0)

## 2020-07-29 LAB — D-DIMER, QUANTITATIVE: D-Dimer, Quant: 0.48 ug/mL-FEU (ref 0.00–0.50)

## 2020-07-29 IMAGING — DX DG CHEST 1V PORT
1 series · 1 of 1 positions shown · non-contrast
Comparison: [DATE]

CLINICAL DATA: [1T], cough, nasal congestion

EXAM:
PORTABLE CHEST 1 VIEW

[chest]
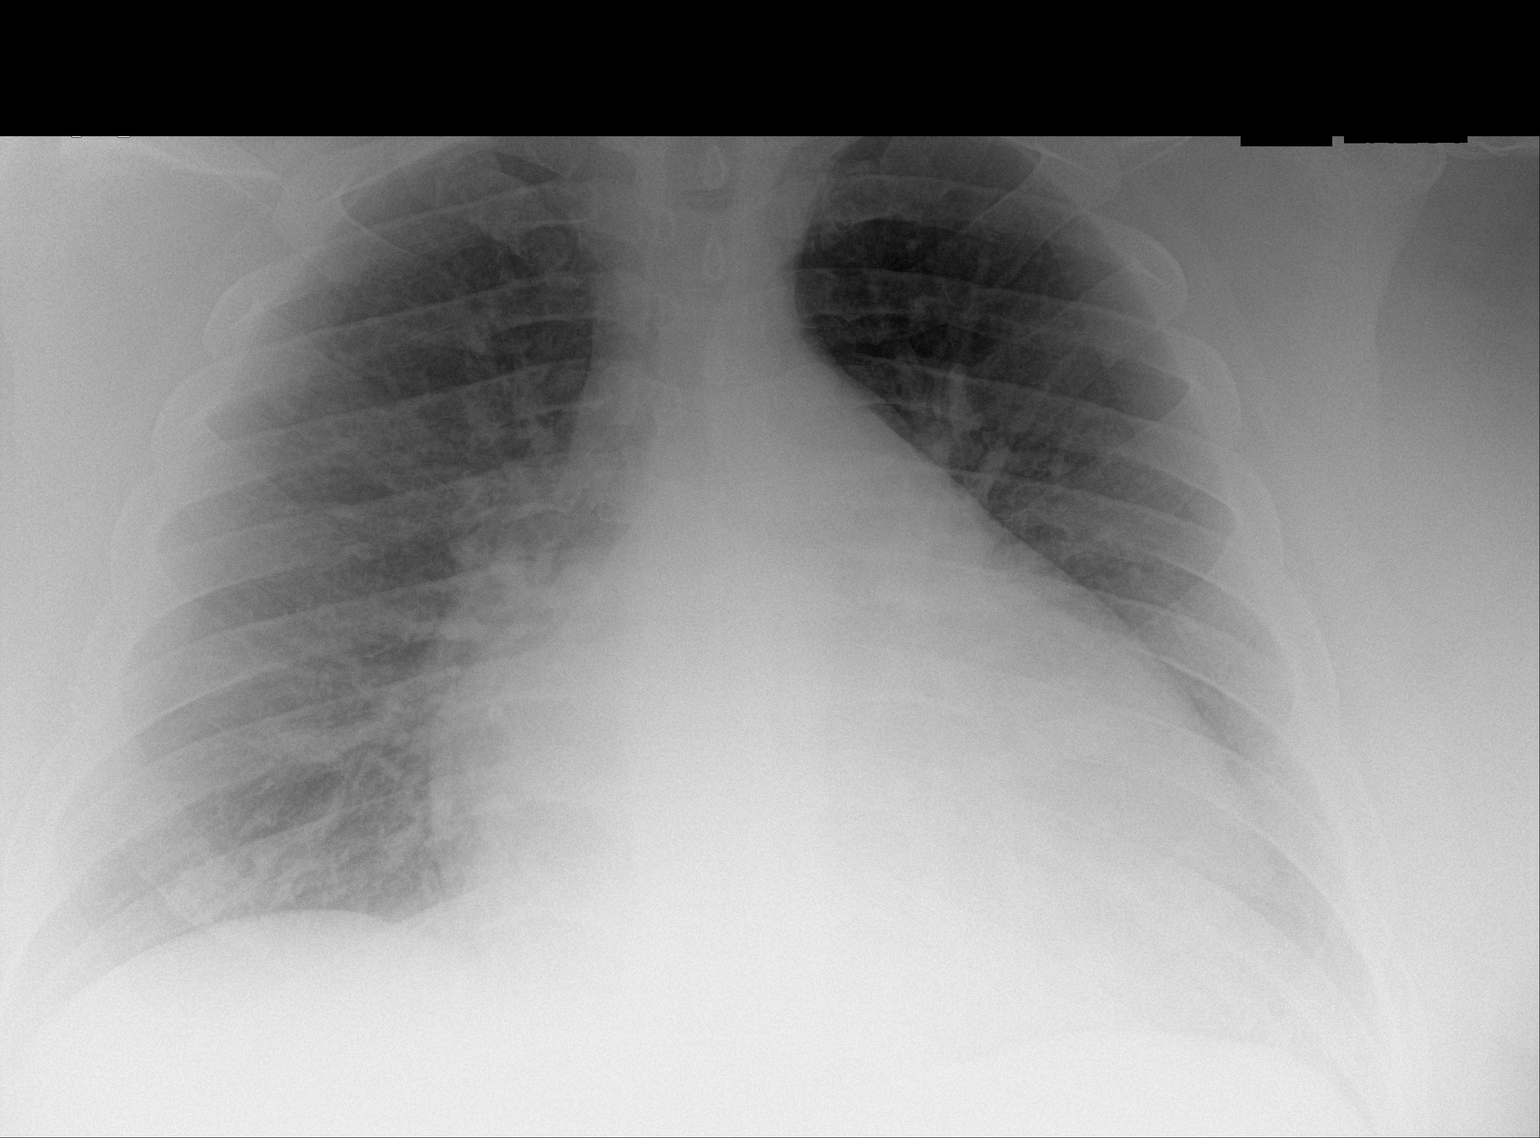

[1 of 1 positions shown; findings below may reference images not displayed]

FINDINGS: Single frontal view of the chest demonstrates stable enlargement of
the cardiac silhouette. No acute airspace disease, effusion, or
pneumothorax. Chronic central vascular congestion unchanged. No
acute bony abnormalities.
IMPRESSION: 1. No acute airspace disease.
2. Chronic cardiomegaly and central vascular congestion.

## 2020-07-29 MED ORDER — ALBUTEROL SULFATE HFA 108 (90 BASE) MCG/ACT IN AERS
4.0000 | INHALATION_SPRAY | Freq: Once | RESPIRATORY_TRACT | Status: AC
  Administered 2020-07-29: 4 via RESPIRATORY_TRACT
  Filled 2020-07-29: qty 6.7

## 2020-07-29 MED ORDER — ACETAMINOPHEN 325 MG PO TABS
650.0000 mg | ORAL_TABLET | Freq: Once | ORAL | Status: AC
  Administered 2020-07-29: 650 mg via ORAL
  Filled 2020-07-29: qty 2

## 2020-07-29 NOTE — ED Provider Notes (Signed)
[facility] EMERGENCY DEPARTMENT Provider Note   CSN: [id number] Arrival date & time: 07/28/20  1239     History No chief complaint on file.   Phillip Young is a 35 y.o. male.  HPI    35 year old male with history of morbid obesity, CHF with EF of 20% comes with a chief complaint of shortness of breath.  Patient reports that he started developing symptoms about 4 to 5 days ago.  He is having body aches, headaches, shortness of breath, cough and URI-like symptoms.  Patient does not have any underlying lung disease -but does have OSA.  He is not vaccinated.  Past Medical History:  Diagnosis Date  . Biventricular congestive heart failure ([place])    Last Echo 11/2019 at [facility] reveals EF 20%  . Class 3 severe obesity due to excess calories with serious comorbidity and body mass index (BMI) of 50.0 to 59.9 in adult ([place]) 02/26/2020  . Essential hypertension 02/26/2020  . GERD without esophagitis 02/26/2020  . Hidradenitis suppurativa 02/26/2020  . OSA (obstructive sleep apnea) 02/26/2020  . Prediabetes 02/26/2020    Patient Active Problem List   Diagnosis Date Noted  . Acute on chronic systolic congestive heart failure ([place])   . Hypokalemia   . Biventricular heart failure ([place])   . Chronic cough   . Acute on chronic systolic (congestive) heart failure ([place]) 02/26/2020  . Morbid obesity with body mass index (BMI) of 60.0 to 69.9 in adult ([place]) 02/26/2020  . Essential hypertension 02/26/2020  . GERD without esophagitis 02/26/2020  . Diuretic-induced hypokalemia 02/26/2020  . OSA (obstructive sleep apnea) 02/26/2020  . Hidradenitis suppurativa 02/26/2020  . Prediabetes 02/26/2020    Past Surgical History:  Procedure Laterality Date  . ABSCESS DRAINAGE    . RIGHT/LEFT HEART CATH AND CORONARY ANGIOGRAPHY N/A 03/04/2020   Procedure: RIGHT/LEFT HEART CATH AND CORONARY ANGIOGRAPHY;  Surgeon: Larey Dresser, MD;  Location: [facility];  Service: Cardiovascular;   Laterality: N/A;       Family History  Problem Relation Age of Onset  . Heart disease Mother   . Hypertension Mother   . Pulmonary Hypertension Mother   . Drug abuse Father        died due to Heroin overdose    Social History   Tobacco Use  . Smoking status: Former Research scientist (life sciences)  . Smokeless tobacco: Former Systems developer  . Tobacco comment: quit in 2019  Substance Use Topics  . Alcohol use: Never  . Drug use: Never    Home Medications Prior to Admission medications   Medication Sig Start Date End Date Taking? Authorizing Provider  dapagliflozin propanediol (FARXIGA) 10 MG TABS tablet Take 1 tablet (10 mg total) by mouth daily. 04/16/20   Bensimhon, Shaune Pascal, MD  digoxin (LANOXIN) 0.125 MG tablet Take 1 tablet (0.125 mg total) by mouth daily. 04/16/20   Bensimhon, Shaune Pascal, MD  ivabradine (CORLANOR) 7.5 MG TABS tablet Take 1 tablet (7.5 mg total) by mouth 2 (two) times daily with a meal. 05/22/20   Larey Dresser, MD  Potassium Chloride ER 20 MEQ TBCR Take 20 mEq by mouth 2 (two) times daily. 04/16/20 06/17/20  Bensimhon, Shaune Pascal, MD  sacubitril-valsartan (ENTRESTO) 49-51 MG Take 1 tablet by mouth 2 (two) times daily. 07/07/20   Clegg, Amy D, NP  spironolactone (ALDACTONE) 25 MG tablet TAKE 1 TABLET(25 MG) BY MOUTH DAILY 07/14/20   Bensimhon, Shaune Pascal, MD  torsemide (DEMADEX) 20 MG tablet Take 60 mg  by mouth daily.    [provider]    Allergies    Coreg [carvedilol]  Review of Systems   Review of Systems  Constitutional: Positive for activity change.  Respiratory: Positive for shortness of breath.   Cardiovascular: Positive for chest pain.  Gastrointestinal: Negative for nausea and vomiting.  Musculoskeletal: Positive for arthralgias.  All other systems reviewed and are negative.   Physical Exam Updated Vital Signs BP 135/77   Pulse 89   Temp 99.2 F (37.3 C) (Oral)   Resp 18   SpO2 98%   Physical Exam Vitals and nursing note reviewed.  Constitutional:       Appearance: He is well-developed.  HENT:     Head: Atraumatic.  Cardiovascular:     Rate and Rhythm: Normal rate.  Pulmonary:     Effort: Pulmonary effort is normal.     Breath sounds: No wheezing or rales.  Musculoskeletal:     Cervical back: Neck supple.     Right lower leg: No edema.     Left lower leg: No edema.  Skin:    General: Skin is warm.  Neurological:     Mental Status: He is alert and oriented to person, place, and time.     ED Results / Procedures / Treatments   Labs (all labs ordered are listed, but only abnormal results are displayed) Labs Reviewed  RESP PANEL BY RT-PCR (FLU A&B, COVID) ARPGX2 - Abnormal; Notable for the following components:      Result Value   SARS Coronavirus 2 by RT PCR POSITIVE (*)    All other components within normal limits  LACTIC ACID, PLASMA  LACTIC ACID, PLASMA  CBC WITH DIFFERENTIAL/PLATELET  COMPREHENSIVE METABOLIC PANEL  D-DIMER, QUANTITATIVE (NOT AT [facility])  PROCALCITONIN  LACTATE DEHYDROGENASE  FERRITIN  TRIGLYCERIDES  FIBRINOGEN  C-REACTIVE PROTEIN  BRAIN NATRIURETIC PEPTIDE    EKG None  Radiology No results found.  Procedures Procedures (including critical care time)  Medications Ordered in ED Medications  albuterol (VENTOLIN HFA) 108 (90 Base) MCG/ACT inhaler 4 puff (has no administration in time range)  acetaminophen (TYLENOL) tablet 650 mg (has no administration in time range)    ED Course  I have reviewed the triage vital signs and the nursing notes.  Pertinent labs & imaging results that were available during my care of the patient were reviewed by me and considered in my medical decision making (see chart for details).    MDM Rules/Calculators/A&P                          35 year old male comes in a chief complaint of shortness of breath.  He is having URI-like symptoms as well.  He is confirmed to be Covid positive.  He is unvaccinated.  Patient risk factors for complication from [id number] include  obesity, OSA, advanced CHF.  Hemodynamically stable.  He is slightly tachypneic but no respiratory distress.  Given his advanced CHF, we will get basic labs and chest x-ray. Anticipate discharge if work-up is reassuring with strict ER return precautions.   Davell Beckstead was evaluated in Emergency Department on 07/29/2020 for the symptoms described in the history of present illness. He was evaluated in the context of the global COVID-19 pandemic, which necessitated consideration that the patient might be at risk for infection with the SARS-CoV-2 virus that causes COVID-19. Institutional protocols and algorithms that pertain to the evaluation of patients at risk for COVID-19 are in a state  of rapid change based on information released by regulatory bodies including the CDC and federal and state organizations. These policies and algorithms were followed during the patient's care in the ED.   Final Clinical Impression(s) / ED Diagnoses Final diagnoses:  Acute COVID-19    Rx / DC Orders ED Discharge Orders    None       Varney Biles, MD 07/29/20 [phone]

## 2020-07-29 NOTE — ED Provider Notes (Signed)
Patient signed out pending lab work.  Improved Covid positive.  Significant medical history.  Lab work obtained over stratify.  O2 sats 93 to 95%.  Lab work reviewed.  Slight lactic acidosis 2.1.  No signs or symptoms of sepsis.  Slight hyponatremia but otherwise no significant metabolic derangements.  And other lab work and risk stratifying tests are negative.  Chest x-ray is reassuring.  Will discharge home with Covid precautions per prior provider.  Taner Rzepka was evaluated in Emergency Department on 07/29/2020 for the symptoms described in the history of present illness. He was evaluated in the context of the global COVID-19 pandemic, which necessitated consideration that the patient might be at risk for infection with the SARS-CoV-2 virus that causes COVID-19. Institutional protocols and algorithms that pertain to the evaluation of patients at risk for COVID-19 are in a state of rapid change based on information released by regulatory bodies including the CDC and federal and state organizations. These policies and algorithms were followed during the patient's care in the ED.    Physical Exam  BP (!) 141/87   Pulse 100   Temp 99.2 F (37.3 C) (Oral)   Resp (!) 21   Ht 1.803 m (5\' 11" )   Wt (!) 204.3 kg   SpO2 93%   BMI 62.82 kg/m     Merryl Hacker, MD 07/29/20 [phone]

## 2020-07-29 NOTE — Discharge Instructions (Addendum)
Your labs and work-up are reassuring.  Return for any new or worsening symptoms including worsening shortness of breath.

## 2020-08-01 ENCOUNTER — Telehealth: Payer: Self-pay | Admitting: Cardiology

## 2020-08-01 NOTE — Telephone Encounter (Signed)
[name] called in reporting he was recently Dx with COVID. Known to AHF with EF of 20%. He has developed worsening dyspnea over the past day. Is notable coughing and wheezing on the phone. Feels his heart is "flying, beating very fast". Seems to be in distress from brief conversation had with patient and his mother. I advised that they call 911 now as he is a serious risk of decompensating, not to wait any longer. She voiced understanding and was calling at the end of our conversation.

## 2020-08-07 DIAGNOSIS — J9610 Chronic respiratory failure, unspecified whether with hypoxia or hypercapnia: Secondary | ICD-10-CM | POA: Diagnosis not present

## 2020-08-30 ENCOUNTER — Encounter (INDEPENDENT_AMBULATORY_CARE_PROVIDER_SITE_OTHER): Payer: Self-pay

## 2020-09-01 ENCOUNTER — Encounter (HOSPITAL_COMMUNITY): Payer: BC Managed Care – PPO

## 2020-09-01 ENCOUNTER — Telehealth (HOSPITAL_COMMUNITY): Payer: Self-pay | Admitting: Cardiology

## 2020-09-01 NOTE — Telephone Encounter (Signed)
appt call reminder made appt details left on voicemail [phone] (M)

## 2020-09-07 DIAGNOSIS — J9610 Chronic respiratory failure, unspecified whether with hypoxia or hypercapnia: Secondary | ICD-10-CM | POA: Diagnosis not present

## 2020-10-02 ENCOUNTER — Ambulatory Visit (HOSPITAL_COMMUNITY)
Admission: RE | Admit: 2020-10-02 | Discharge: 2020-10-02 | Disposition: A | Discharge status: Home / Self Care | Payer: BC Managed Care – PPO | Source: Ambulatory Visit | Attending: Family Medicine | Admitting: Family Medicine

## 2020-10-02 ENCOUNTER — Encounter (HOSPITAL_COMMUNITY): Payer: Self-pay

## 2020-10-02 ENCOUNTER — Other Ambulatory Visit: Payer: Self-pay

## 2020-10-02 VITALS — BP 126/92 | HR 93 | Ht 72.0 in | Wt >= 6400 oz

## 2020-10-02 DIAGNOSIS — I110 Hypertensive heart disease with heart failure: Secondary | ICD-10-CM | POA: Diagnosis not present

## 2020-10-02 DIAGNOSIS — Z79899 Other long term (current) drug therapy: Secondary | ICD-10-CM | POA: Insufficient documentation

## 2020-10-02 DIAGNOSIS — I5082 Biventricular heart failure: Secondary | ICD-10-CM | POA: Insufficient documentation

## 2020-10-02 DIAGNOSIS — Z8249 Family history of ischemic heart disease and other diseases of the circulatory system: Secondary | ICD-10-CM | POA: Diagnosis not present

## 2020-10-02 DIAGNOSIS — Z7984 Long term (current) use of oral hypoglycemic drugs: Secondary | ICD-10-CM | POA: Diagnosis not present

## 2020-10-02 DIAGNOSIS — G4733 Obstructive sleep apnea (adult) (pediatric): Secondary | ICD-10-CM

## 2020-10-02 DIAGNOSIS — E119 Type 2 diabetes mellitus without complications: Secondary | ICD-10-CM | POA: Diagnosis not present

## 2020-10-02 DIAGNOSIS — I428 Other cardiomyopathies: Secondary | ICD-10-CM | POA: Insufficient documentation

## 2020-10-02 DIAGNOSIS — Z87891 Personal history of nicotine dependence: Secondary | ICD-10-CM | POA: Diagnosis not present

## 2020-10-02 DIAGNOSIS — R7303 Prediabetes: Secondary | ICD-10-CM | POA: Diagnosis not present

## 2020-10-02 DIAGNOSIS — Z6841 Body Mass Index (BMI) 40.0 and over, adult: Secondary | ICD-10-CM | POA: Diagnosis not present

## 2020-10-02 DIAGNOSIS — I5022 Chronic systolic (congestive) heart failure: Secondary | ICD-10-CM

## 2020-10-02 DIAGNOSIS — Z8616 Personal history of COVID-19: Secondary | ICD-10-CM | POA: Insufficient documentation

## 2020-10-02 LAB — BASIC METABOLIC PANEL
Anion gap: 8 (ref 5–15)
BUN: 8 mg/dL (ref 6–20)
CO2: 28 mmol/L (ref 22–32)
Calcium: 9.3 mg/dL (ref 8.9–10.3)
Chloride: 100 mmol/L (ref 98–111)
Creatinine, Ser: 0.95 mg/dL (ref 0.61–1.24)
GFR, Estimated: >60 mL/min (ref >60)
Glucose, Bld: 115 mg/dL — ABNORMAL HIGH (ref 70–99)
Potassium: 3.5 mmol/L (ref 3.5–5.1)
Sodium: 136 mmol/L (ref 135–145)

## 2020-10-02 LAB — HEMOGLOBIN A1C
Hgb A1c MFr Bld: 6.1 % — ABNORMAL HIGH (ref 4.8–5.6)
Mean Plasma Glucose: 128.37 mg/dL

## 2020-10-02 LAB — BRAIN NATRIURETIC PEPTIDE: B Natriuretic Peptide: 153.4 pg/mL — ABNORMAL HIGH (ref 0.0–100.0)

## 2020-10-02 LAB — DIGOXIN LEVEL: Digoxin Level: <0.2 ng/mL — ABNORMAL LOW (ref 0.8–2.0)

## 2020-10-02 MED ORDER — SACUBITRIL-VALSARTAN 97-103 MG PO TABS: 1.0000 | ORAL_TABLET | Freq: Two times a day (BID) | ORAL | 11 refills | Status: DC

## 2020-10-02 NOTE — Addendum Note (Signed)
Encounter addended by: Rafael Bihari, FNP on: 10/02/2020 11:42 AM  Actions taken: Clinical Note Signed

## 2020-10-02 NOTE — Patient Instructions (Signed)
INCREASE Entresto to 97/'103mg'$  (1 tab) twice a day  INCREASE Torsemide to '60mg'$  (3 tabs) twice a day for 3 days THEN back down to '60mg'$  (3 tabs) daily.  Labs today We will only contact you if something comes back abnormal or we need to make some changes. Otherwise no news is good news!  Your physician recommends that you schedule a follow-up appointment in: 3-4 weeks with the Nurse Practitioner/Physician Assistant.  Please call office at [phone] option 2 if you have any questions or concerns.   At the [facility], you and your health needs are our priority. As part of our continuing mission to provide you with exceptional heart care, we have created designated Provider Care Teams. These Care Teams include your primary Cardiologist (physician) and Advanced Practice Providers (APPs- Physician Assistants and Nurse Practitioners) who all work together to provide you with the care you need, when you need it.   You may see any of the following providers on your designated Care Team at your next follow up: Marland Kitchen Dr Glori Bickers . Dr [name] . Dr Vickki Muff . Darrick Grinder, NP . Lyda Jester, [place] . Audry Riles, PharmD   Please be sure to bring in all your medications bottles to every appointment.

## 2020-10-02 NOTE — Progress Notes (Addendum)
PCP: Patient, No Pcp Per Cardiology: Dr. [name]  HPI: 35 y.o. with history of cardiomyopathy and obesity.  He has been known to have a cardiomyopathy (biventricular failure) since back in 2019, echo in 2019 with EF 20% [facility]).  He has been followed in the [facility] system.  Admitted to [facility] in [id number] with A/C Systolic HF. Diuresed with IV lasix and transitioned to torsemide. Placed on milrinone with low output and had RHC/LHC. Cath showed no CAD and volume overload.  Blood gas revealed a CO2 level elevated to 78. CPAP was initiated.   He was unable to tolerate Coreg due to wheezing and diarrhea.   06/17/2020 Echo EF 20-25%, RV poorly visualized.   Today she returns for HF follow up. Last seen (12/21) and weight was up due to increased fluid intake. Entresto was increased.  Now, overall feeling fine. Weight up 15 lbs since last visit. Denies increasing SOB, CP, dizziness, edema, or PND/Orthopnea. Appetite ok. No fever or chills. Weight at home 440-450 pounds. Taking all medications. Works full time as a Recruitment consultant. Been working OT lately, been taking extra torsemide about 1x/week. COVID+ in January 2022, unable to tolerate CPAP due to coughing post-covid.  Labs (10/21): K 3.7, creatinine 1.07 Labs (1/22): K 3.6, creatinine 1.02  PMH: 1. Hidradenitis suppuritiva 2. GERD 3. OSA: Suspected.  4. Diabetes 5. Morbid obesity 6. Fe deficiency anemia 7. Chronic systolic CHF: Nonischemic cardiomyopathy.  No history of ETOH or drugs.  +ANA.  No strong FH of cardiomyopathy.  - Echo (10/19, [facility]): EF 20%.  - Echo (5/21, [facility]): EF 20% - Echo (8/21): EF < 20%, moderate LV dilation, severely decreased RV function with severe RV dilation, severe biatrial enlargement, mild MR.  - Echo 06/17/2020 EF  - LHC/RHC (8/21): No significant coronary disease; mean RA 19, PA 71/20 mean 39, mean PCWP 23, CI 2.1, PVR 2.5 WU.  - Echo (11/21): EF 20-25%, RV poorly visualized.  8. Prior  smoker.   ROS: All systems negative except as listed in HPI, PMH and Problem List.  Social History   Socioeconomic History  . Marital status: Unknown    Spouse name: Not on file  . Number of children: Not on file  . Years of education: Not on file  . Highest education level: Not on file  Occupational History  . Not on file  Tobacco Use  . Smoking status: Former Research scientist (life sciences)  . Smokeless tobacco: Former Systems developer  . Tobacco comment: quit in 2019  Substance and Sexual Activity  . Alcohol use: Never  . Drug use: Never  . Sexual activity: Not on file  Other Topics Concern  . Not on file  Social History Narrative  . Not on file   Social Determinants of Health   Financial Resource Strain: Not on file  Food Insecurity: Not on file  Transportation Needs: Not on file  Physical Activity: Not on file  Stress: Not on file  Social Connections: Not on file  Intimate Partner Violence: Not on file   Family History  Problem Relation Age of Onset  . Heart disease Mother   . Hypertension Mother   . Pulmonary Hypertension Mother   . Drug abuse Father        died due to Heroin overdose   Past Medical History:  Diagnosis Date  . Biventricular congestive heart failure ([place])    Last Echo 11/2019 at [facility] reveals EF 20%  . Class 3 severe obesity due to  excess calories with serious comorbidity and body mass index (BMI) of 50.0 to 59.9 in adult [facility]) 02/26/2020  . Essential hypertension 02/26/2020  . GERD without esophagitis 02/26/2020  . Hidradenitis suppurativa 02/26/2020  . OSA (obstructive sleep apnea) 02/26/2020  . Prediabetes 02/26/2020   Current Outpatient Medications  Medication Sig Dispense Refill  . dapagliflozin propanediol (FARXIGA) 10 MG TABS tablet Take 1 tablet (10 mg total) by mouth daily. 90 tablet 3  . digoxin (LANOXIN) 0.125 MG tablet Take 1 tablet (0.125 mg total) by mouth daily. 90 tablet 3  . ivabradine (CORLANOR) 7.5 MG TABS tablet Take 1 tablet (7.5 mg total) by mouth 2 (two)  times daily with a meal. 180 tablet 3  . Potassium Chloride ER 20 MEQ TBCR Take 20 mEq by mouth 2 (two) times daily. 180 tablet 3  . sacubitril-valsartan (ENTRESTO) 49-51 MG Take 1 tablet by mouth 2 (two) times daily. 60 tablet 3  . spironolactone (ALDACTONE) 25 MG tablet TAKE 1 TABLET(25 MG) BY MOUTH DAILY 90 tablet 0  . torsemide (DEMADEX) 20 MG tablet Take 60 mg by mouth daily.     No current facility-administered medications for this encounter.   Vitals:   10/02/20 0913  BP: (!) 126/92  Pulse: 93  SpO2: 90%  Weight: (!) 211.6 kg (466 lb 9.6 oz)  Height: 6' (1.829 m)   Wt Readings from Last 3 Encounters:  10/02/20 (!) 211.6 kg (466 lb 9.6 oz)  07/29/20 (!) 204.3 kg (450 lb 6.4 oz)  07/07/20 (!) 204.3 kg (450 lb 6.4 oz)   PHYSICAL EXAM: General:  Well appearing. No resp difficulty. HEENT: normal Neck: supple. Thick neck, difficult to assess JVP. Carotids 2+ bilat; no bruits. No lymphadenopathy or thryomegaly appreciated. Cor: PMI nondisplaced. Regular rate & rhythm. No rubs, gallops or murmurs. Lungs: clear Abdomen: Obese, soft, nontender, +distended. No hepatosplenomegaly. No bruits or masses. Good bowel sounds. Extremities: No cyanosis, clubbing, rash, 1+ LE edema. Neuro: Alert & orientedx3, cranial nerves grossly intact. moves all 4 extremities w/o difficulty. Affect pleasant.   ASSESSMENT & PLAN: 1. Chronic systolic CHF: Known cardiomyopathy since 2019. Echo 02/27/2020 his admission with EF <20%, moderate LV dilation, severely decreased RV function with severe RV dilation, severe biatrial enlargement, mild MR. No history of ETOH/drugs. No FH of cardiomyopathy (mother with [place]). 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 Suspect he is too large for cardiac MRI. Narrow QRS on ECG so not CRT candidate. RHC/LHC on 03/04/20 with no CAD (nonischemic cardiomyopathy), elevated left and right heart filling pressures, and low CI at  2.1. 05/2020 Echo showed EF 20-25%. Saw EP but needs to lose weight for ICD.  - NYHA III, confounded by obesity and knee pain. Volume up on exam and by weight. - Increase Entresto to 97/103 mg bid. BMET today, repeat 7-10 days. - Increase torsemide to 60 mg bid x 3 days, then back to 60 mg daily - Continue Ivabradine 7.5 mg bid. HR 93. - Continue digoxin 0.125 daily. Check level today.  - Continue spironolactone 25 mg daily  - Continue Farxiga 10 mg daily. No GU symptoms. - He did not tolerate Coreg (Updated allergies).  2. OSA: Hypercarbic respiratory failure in hospital.  - Has CPAP. Encouraged to use nightly.  3. Obesity : Body mass index is 63.28 kg/m.  Imperative to lose weight.  - [facility] but had to reschedule because of COVID.  4. DM2: a1c 6.8, working on getting  PCP. - Will check A1c today.  - Strongly encouraged to get COVID vaccinations - Follow up in 3-4 weeks w/ APP to evaluate fluid status.  Maricela Bo [facility]  FNP-BC  10/02/2020

## 2020-10-05 DIAGNOSIS — J9610 Chronic respiratory failure, unspecified whether with hypoxia or hypercapnia: Secondary | ICD-10-CM | POA: Diagnosis not present

## 2020-10-08 ENCOUNTER — Other Ambulatory Visit: Payer: Self-pay

## 2020-10-08 ENCOUNTER — Inpatient Hospital Stay (HOSPITAL_COMMUNITY)
Admission: EM | Admit: 2020-10-08 | Discharge: 2020-10-16 | DRG: 193 | Disposition: A | Discharge status: Home / Self Care | Payer: BC Managed Care – PPO | Attending: Internal Medicine | Admitting: Internal Medicine

## 2020-10-08 ENCOUNTER — Emergency Department (HOSPITAL_COMMUNITY): Payer: BC Managed Care – PPO

## 2020-10-08 ENCOUNTER — Encounter (HOSPITAL_COMMUNITY): Payer: Self-pay | Admitting: Emergency Medicine

## 2020-10-08 DIAGNOSIS — I517 Cardiomegaly: Secondary | ICD-10-CM | POA: Diagnosis not present

## 2020-10-08 DIAGNOSIS — Z6841 Body Mass Index (BMI) 40.0 and over, adult: Secondary | ICD-10-CM

## 2020-10-08 DIAGNOSIS — R918 Other nonspecific abnormal finding of lung field: Secondary | ICD-10-CM | POA: Diagnosis not present

## 2020-10-08 DIAGNOSIS — I110 Hypertensive heart disease with heart failure: Secondary | ICD-10-CM | POA: Diagnosis present

## 2020-10-08 DIAGNOSIS — B1910 Unspecified viral hepatitis B without hepatic coma: Secondary | ICD-10-CM | POA: Diagnosis not present

## 2020-10-08 DIAGNOSIS — R059 Cough, unspecified: Secondary | ICD-10-CM | POA: Diagnosis not present

## 2020-10-08 DIAGNOSIS — G4733 Obstructive sleep apnea (adult) (pediatric): Secondary | ICD-10-CM | POA: Diagnosis present

## 2020-10-08 DIAGNOSIS — I5082 Biventricular heart failure: Secondary | ICD-10-CM | POA: Diagnosis not present

## 2020-10-08 DIAGNOSIS — R7303 Prediabetes: Secondary | ICD-10-CM | POA: Diagnosis present

## 2020-10-08 DIAGNOSIS — R7989 Other specified abnormal findings of blood chemistry: Secondary | ICD-10-CM | POA: Diagnosis present

## 2020-10-08 DIAGNOSIS — Z813 Family history of other psychoactive substance abuse and dependence: Secondary | ICD-10-CM | POA: Diagnosis not present

## 2020-10-08 DIAGNOSIS — R0600 Dyspnea, unspecified: Secondary | ICD-10-CM | POA: Diagnosis not present

## 2020-10-08 DIAGNOSIS — I5023 Acute on chronic systolic (congestive) heart failure: Secondary | ICD-10-CM | POA: Diagnosis not present

## 2020-10-08 DIAGNOSIS — Z452 Encounter for adjustment and management of vascular access device: Secondary | ICD-10-CM | POA: Diagnosis not present

## 2020-10-08 DIAGNOSIS — J123 Human metapneumovirus pneumonia: Secondary | ICD-10-CM | POA: Diagnosis not present

## 2020-10-08 DIAGNOSIS — J811 Chronic pulmonary edema: Secondary | ICD-10-CM | POA: Diagnosis not present

## 2020-10-08 DIAGNOSIS — Z87891 Personal history of nicotine dependence: Secondary | ICD-10-CM

## 2020-10-08 DIAGNOSIS — Z20822 Contact with and (suspected) exposure to covid-19: Secondary | ICD-10-CM | POA: Diagnosis not present

## 2020-10-08 DIAGNOSIS — I4891 Unspecified atrial fibrillation: Secondary | ICD-10-CM | POA: Diagnosis not present

## 2020-10-08 DIAGNOSIS — Z9119 Patient's noncompliance with other medical treatment and regimen: Secondary | ICD-10-CM

## 2020-10-08 DIAGNOSIS — K219 Gastro-esophageal reflux disease without esophagitis: Secondary | ICD-10-CM | POA: Diagnosis not present

## 2020-10-08 DIAGNOSIS — Z888 Allergy status to other drugs, medicaments and biological substances status: Secondary | ICD-10-CM

## 2020-10-08 DIAGNOSIS — Z8616 Personal history of COVID-19: Secondary | ICD-10-CM

## 2020-10-08 DIAGNOSIS — E119 Type 2 diabetes mellitus without complications: Secondary | ICD-10-CM | POA: Diagnosis present

## 2020-10-08 DIAGNOSIS — E785 Hyperlipidemia, unspecified: Secondary | ICD-10-CM | POA: Diagnosis present

## 2020-10-08 DIAGNOSIS — Z23 Encounter for immunization: Secondary | ICD-10-CM

## 2020-10-08 DIAGNOSIS — R350 Frequency of micturition: Secondary | ICD-10-CM | POA: Diagnosis present

## 2020-10-08 DIAGNOSIS — E871 Hypo-osmolality and hyponatremia: Secondary | ICD-10-CM | POA: Diagnosis not present

## 2020-10-08 DIAGNOSIS — M549 Dorsalgia, unspecified: Secondary | ICD-10-CM | POA: Diagnosis present

## 2020-10-08 DIAGNOSIS — T502X5A Adverse effect of carbonic-anhydrase inhibitors, benzothiadiazides and other diuretics, initial encounter: Secondary | ICD-10-CM | POA: Diagnosis not present

## 2020-10-08 DIAGNOSIS — Z8249 Family history of ischemic heart disease and other diseases of the circulatory system: Secondary | ICD-10-CM | POA: Diagnosis not present

## 2020-10-08 DIAGNOSIS — I10 Essential (primary) hypertension: Secondary | ICD-10-CM | POA: Diagnosis present

## 2020-10-08 DIAGNOSIS — Z79899 Other long term (current) drug therapy: Secondary | ICD-10-CM

## 2020-10-08 DIAGNOSIS — R0602 Shortness of breath: Secondary | ICD-10-CM | POA: Diagnosis not present

## 2020-10-08 DIAGNOSIS — I420 Dilated cardiomyopathy: Secondary | ICD-10-CM | POA: Diagnosis present

## 2020-10-08 DIAGNOSIS — Z9989 Dependence on other enabling machines and devices: Secondary | ICD-10-CM | POA: Diagnosis not present

## 2020-10-08 DIAGNOSIS — J9601 Acute respiratory failure with hypoxia: Secondary | ICD-10-CM | POA: Diagnosis not present

## 2020-10-08 DIAGNOSIS — J189 Pneumonia, unspecified organism: Secondary | ICD-10-CM | POA: Diagnosis present

## 2020-10-08 DIAGNOSIS — L732 Hidradenitis suppurativa: Secondary | ICD-10-CM | POA: Diagnosis present

## 2020-10-08 DIAGNOSIS — I5043 Acute on chronic combined systolic (congestive) and diastolic (congestive) heart failure: Secondary | ICD-10-CM | POA: Diagnosis not present

## 2020-10-08 DIAGNOSIS — R197 Diarrhea, unspecified: Secondary | ICD-10-CM | POA: Diagnosis not present

## 2020-10-08 DIAGNOSIS — I2699 Other pulmonary embolism without acute cor pulmonale: Secondary | ICD-10-CM | POA: Diagnosis not present

## 2020-10-08 DIAGNOSIS — R600 Localized edema: Secondary | ICD-10-CM | POA: Diagnosis not present

## 2020-10-08 DIAGNOSIS — R053 Chronic cough: Secondary | ICD-10-CM | POA: Diagnosis present

## 2020-10-08 LAB — SARS CORONAVIRUS 2 (TAT 6-24 HRS): SARS Coronavirus 2: NEGATIVE

## 2020-10-08 LAB — RESPIRATORY PANEL BY PCR

## 2020-10-08 LAB — BASIC METABOLIC PANEL
Anion gap: 11 (ref 5–15)
BUN: 7 mg/dL (ref 6–20)
CO2: 25 mmol/L (ref 22–32)
Calcium: 8.9 mg/dL (ref 8.9–10.3)
Chloride: 96 mmol/L — ABNORMAL LOW (ref 98–111)
Creatinine, Ser: 1.13 mg/dL (ref 0.61–1.24)
GFR, Estimated: >60 mL/min (ref >60)
Glucose, Bld: 137 mg/dL — ABNORMAL HIGH (ref 70–99)
Potassium: 3.4 mmol/L — ABNORMAL LOW (ref 3.5–5.1)
Sodium: 132 mmol/L — ABNORMAL LOW (ref 135–145)

## 2020-10-08 LAB — CBC
HCT: 41.8 % (ref 39.0–52.0)
Hemoglobin: 14 g/dL (ref 13.0–17.0)
MCH: 31 pg (ref 26.0–34.0)
MCHC: 33.5 g/dL (ref 30.0–36.0)
MCV: 92.7 fL (ref 80.0–100.0)
Platelets: 265 10*3/uL (ref 150–400)
RBC: 4.51 MIL/uL (ref 4.22–5.81)
RDW: 14.1 % (ref 11.5–15.5)
WBC: 7.2 10*3/uL (ref 4.0–10.5)
nRBC: 0 % (ref 0.0–0.2)

## 2020-10-08 LAB — HEPATIC FUNCTION PANEL
ALT: 36 U/L (ref 0–44)
AST: 44 U/L — ABNORMAL HIGH (ref 15–41)
Albumin: 3.3 g/dL — ABNORMAL LOW (ref 3.5–5.0)
Alkaline Phosphatase: 70 U/L (ref 38–126)
Bilirubin, Direct: 0.2 mg/dL (ref 0.0–0.2)
Indirect Bilirubin: 1.1 mg/dL — ABNORMAL HIGH (ref 0.3–0.9)
Total Bilirubin: 1.3 mg/dL — ABNORMAL HIGH (ref 0.3–1.2)
Total Protein: 8.1 g/dL (ref 6.5–8.1)

## 2020-10-08 LAB — DIGOXIN LEVEL: Digoxin Level: 0.2 ng/mL — ABNORMAL LOW (ref 0.8–2.0)

## 2020-10-08 LAB — TROPONIN I (HIGH SENSITIVITY)
Troponin I (High Sensitivity): 25 ng/L — ABNORMAL HIGH (ref <18)
Troponin I (High Sensitivity): 30 ng/L — ABNORMAL HIGH (ref <18)

## 2020-10-08 LAB — BRAIN NATRIURETIC PEPTIDE: B Natriuretic Peptide: 93.2 pg/mL (ref 0.0–100.0)

## 2020-10-08 LAB — PROCALCITONIN: Procalcitonin: <0.1 ng/mL

## 2020-10-08 LAB — D-DIMER, QUANTITATIVE: D-Dimer, Quant: 1.55 ug/mL-FEU — ABNORMAL HIGH (ref 0.00–0.50)

## 2020-10-08 LAB — CBG MONITORING, ED: Glucose-Capillary: 132 mg/dL — ABNORMAL HIGH (ref 70–99)

## 2020-10-08 IMAGING — CR DG CHEST 2V
2 series · 2 of 2 positions shown · non-contrast
Comparison: [DATE].

CLINICAL DATA: Dyspnea.  Productive cough.  Fever/body aches.

EXAM:
CHEST - 2 VIEW

[chest pa]
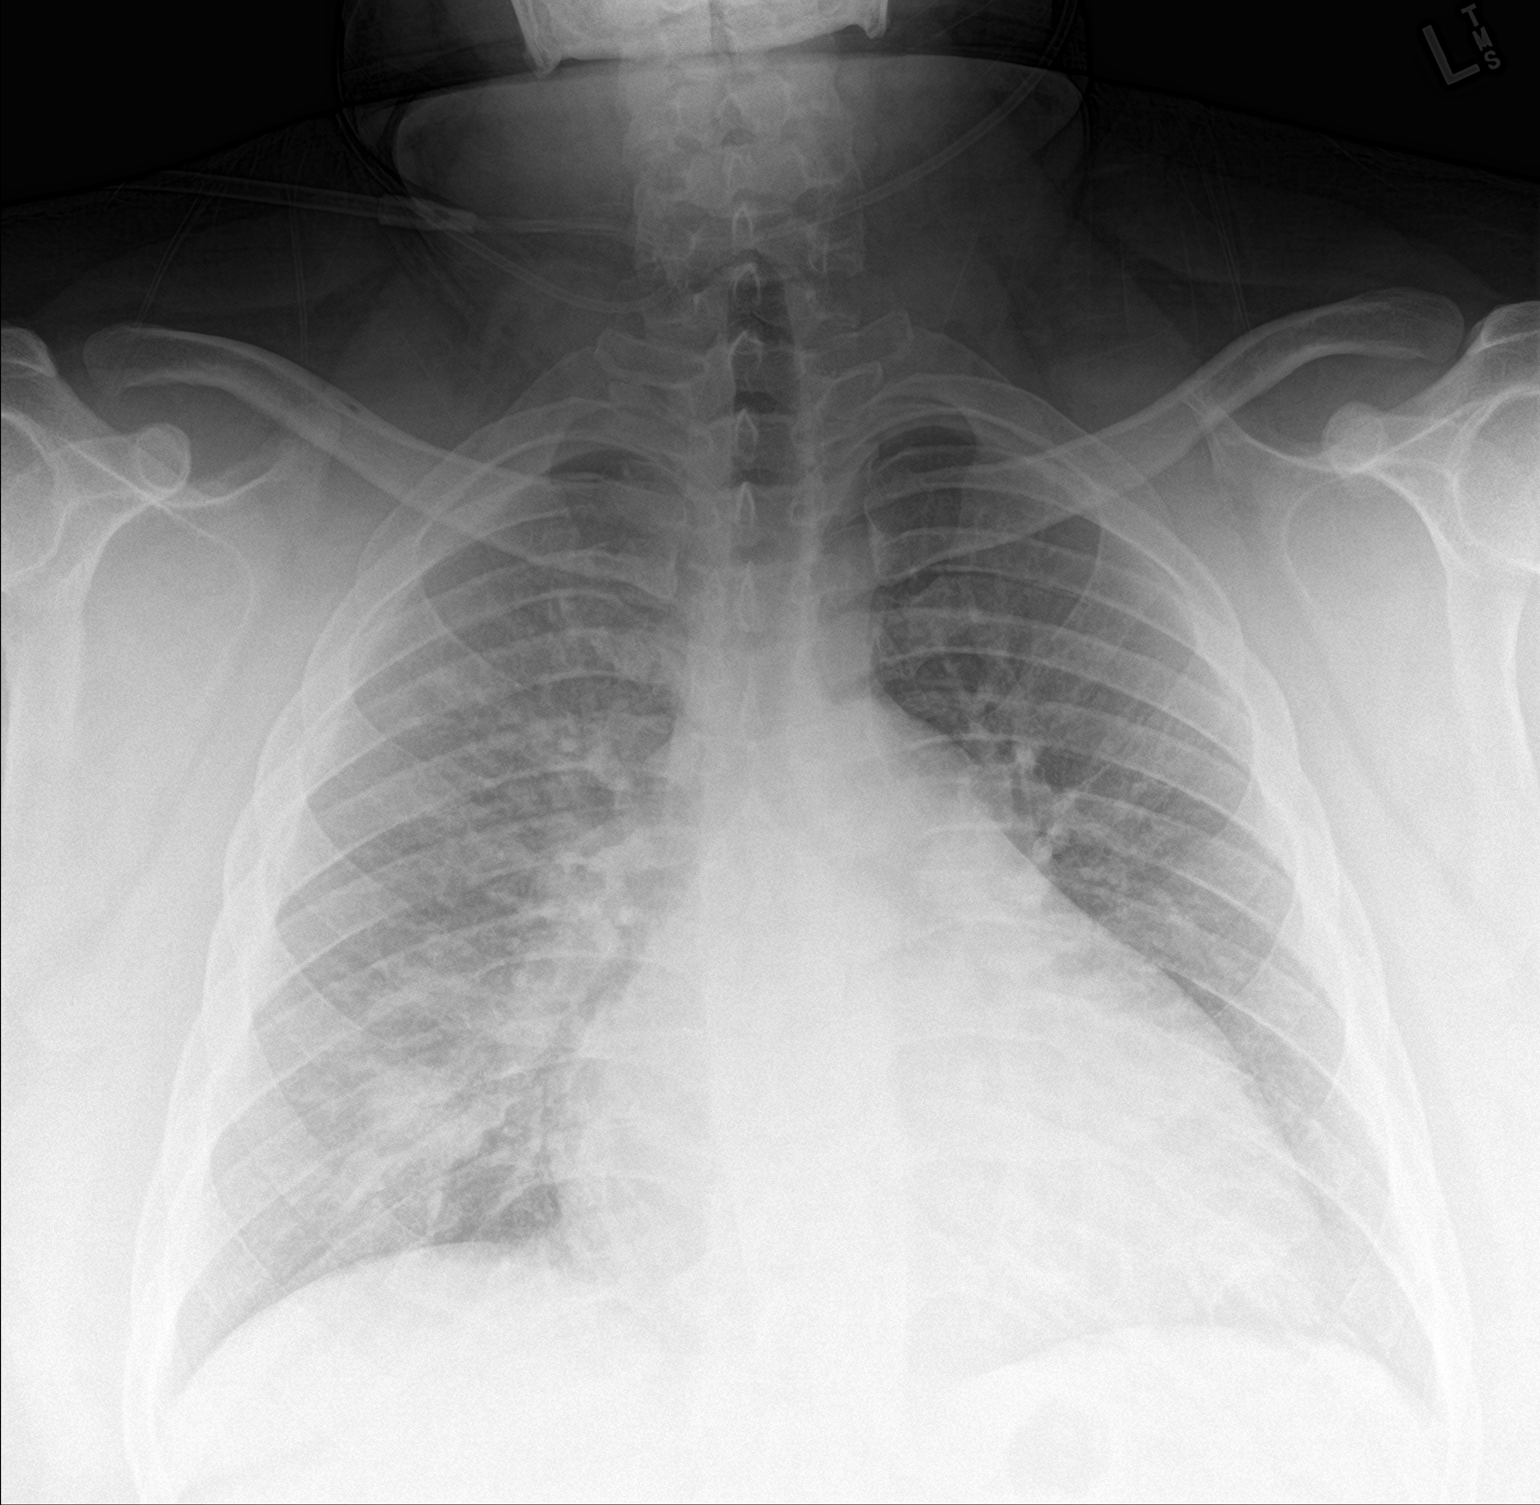

[chest lat]
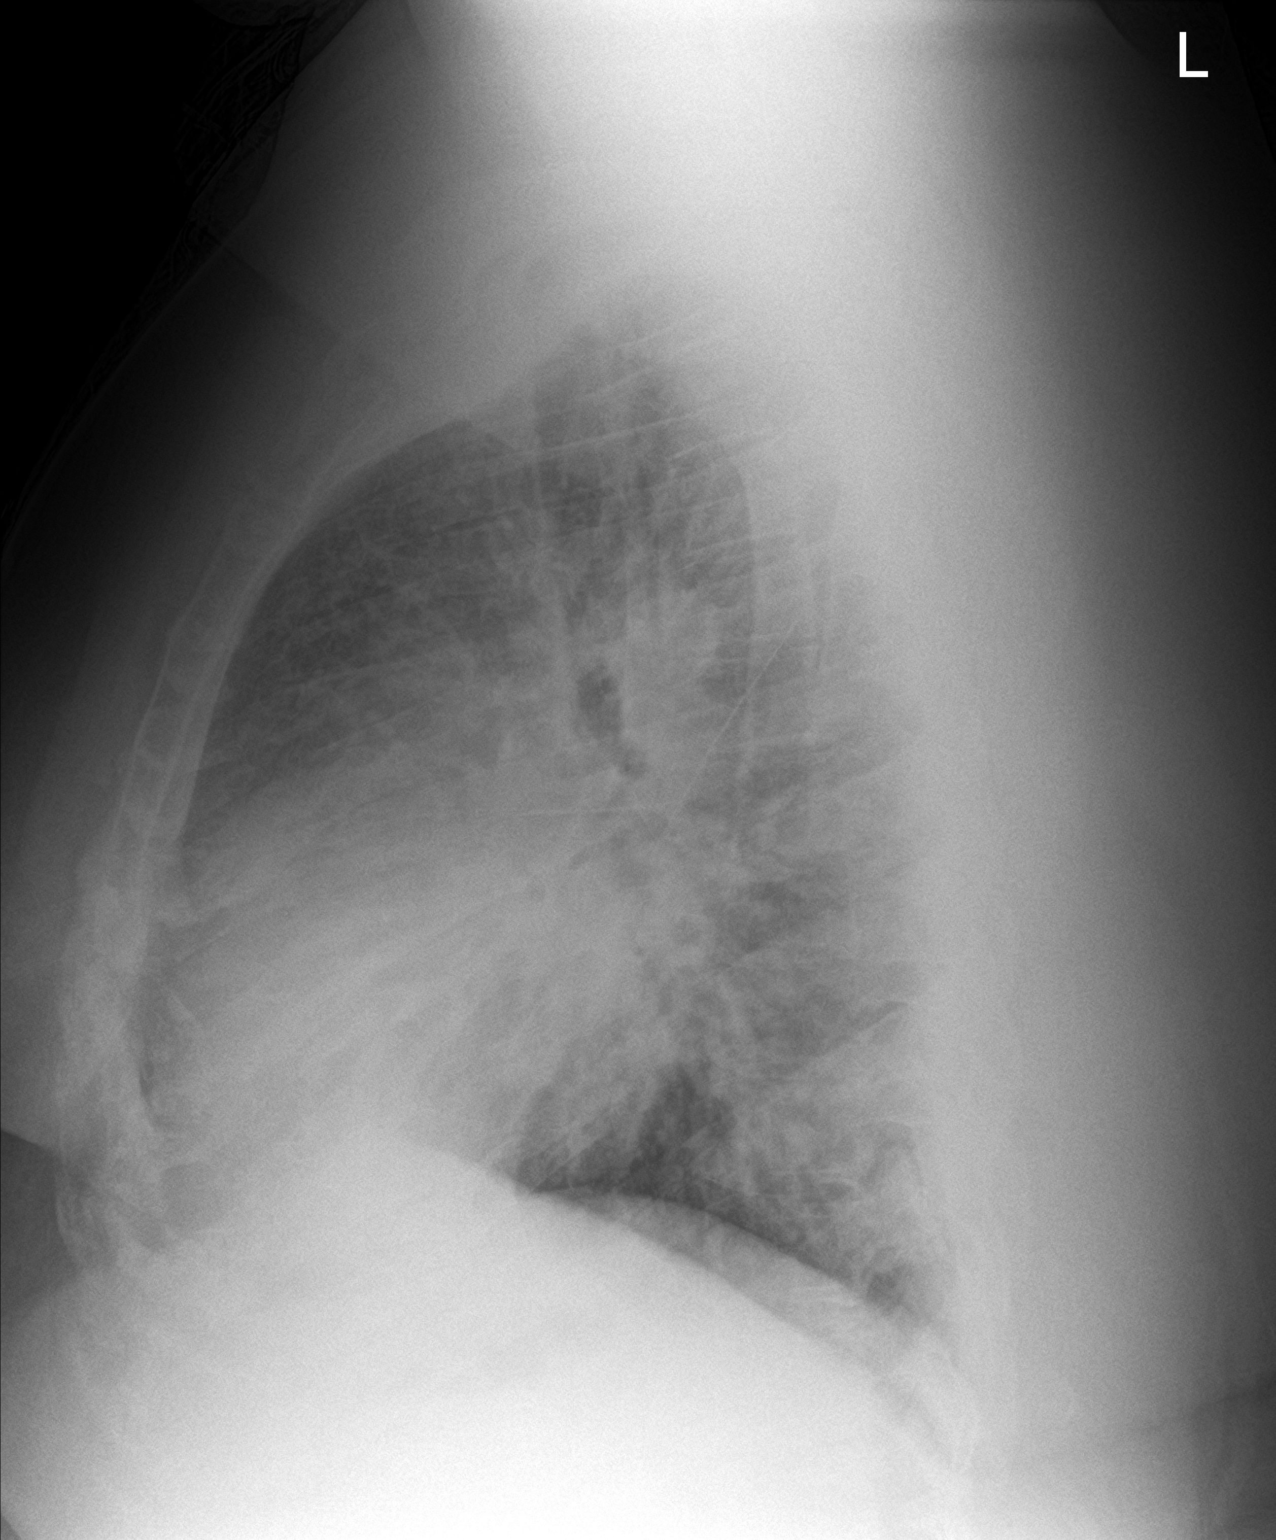

[2 of 2 positions shown; findings below may reference images not displayed]

FINDINGS: New consolidation in the right lower lung. No visible pleural
effusions or pneumothorax. Similar enlarged cardiac silhouette.
Similar pulmonary vascular congestion.
IMPRESSION: 1. New consolidation in the right lower lung, concerning for
pneumonia. Recommend imaging follow-up to resolution.
2. Similar cardiomegaly and pulmonary vascular congestion.

## 2020-10-08 MED ORDER — DIGOXIN 125 MCG PO TABS
0.1250 mg | ORAL_TABLET | Freq: Every day | ORAL | Status: DC
  Administered 2020-10-09 – 2020-10-16 (×8): 0.125 mg via ORAL
  Filled 2020-10-08 (×8): qty 1

## 2020-10-08 MED ORDER — SACUBITRIL-VALSARTAN 49-51 MG PO TABS
1.0000 | ORAL_TABLET | Freq: Two times a day (BID) | ORAL | Status: DC
  Administered 2020-10-08 – 2020-10-16 (×16): 1 via ORAL
  Filled 2020-10-08 (×16): qty 1

## 2020-10-08 MED ORDER — SODIUM CHLORIDE 0.9 % IV SOLN
1.0000 g | Freq: Once | INTRAVENOUS | Status: AC
  Administered 2020-10-08: 1 g via INTRAVENOUS
  Filled 2020-10-08: qty 10

## 2020-10-08 MED ORDER — GUAIFENESIN 100 MG/5ML PO SOLN
5.0000 mL | ORAL | Status: DC | PRN
  Administered 2020-10-08 – 2020-10-10 (×4): 100 mg via ORAL
  Filled 2020-10-08 (×4): qty 5

## 2020-10-08 MED ORDER — SODIUM CHLORIDE 0.9 % IV SOLN
500.0000 mg | Freq: Once | INTRAVENOUS | Status: AC
  Administered 2020-10-08: 500 mg via INTRAVENOUS
  Filled 2020-10-08: qty 500

## 2020-10-08 MED ORDER — INSULIN ASPART 100 UNIT/ML ~~LOC~~ SOLN: 0.0000 [IU] | Freq: Three times a day (TID) | SUBCUTANEOUS | Status: DC

## 2020-10-08 MED ORDER — DAPAGLIFLOZIN PROPANEDIOL 10 MG PO TABS
10.0000 mg | ORAL_TABLET | Freq: Every day | ORAL | Status: DC
  Administered 2020-10-09 – 2020-10-16 (×8): 10 mg via ORAL
  Filled 2020-10-08 (×8): qty 1

## 2020-10-08 MED ORDER — POTASSIUM CHLORIDE CRYS ER 20 MEQ PO TBCR
40.0000 meq | EXTENDED_RELEASE_TABLET | Freq: Two times a day (BID) | ORAL | Status: AC
  Administered 2020-10-08 (×2): 40 meq via ORAL
  Filled 2020-10-08 (×2): qty 2

## 2020-10-08 MED ORDER — IVABRADINE HCL 7.5 MG PO TABS
7.5000 mg | ORAL_TABLET | Freq: Two times a day (BID) | ORAL | Status: DC
  Administered 2020-10-08 – 2020-10-10 (×4): 7.5 mg via ORAL
  Filled 2020-10-08 (×5): qty 1

## 2020-10-08 MED ORDER — ENOXAPARIN SODIUM 40 MG/0.4ML ~~LOC~~ SOLN
40.0000 mg | Freq: Every day | SUBCUTANEOUS | Status: DC
  Administered 2020-10-08: 40 mg via SUBCUTANEOUS
  Filled 2020-10-08: qty 0.4

## 2020-10-08 MED ORDER — IPRATROPIUM-ALBUTEROL 0.5-2.5 (3) MG/3ML IN SOLN: 3.0000 mL | RESPIRATORY_TRACT | Status: DC | PRN

## 2020-10-08 MED ORDER — SPIRONOLACTONE 25 MG PO TABS
25.0000 mg | ORAL_TABLET | Freq: Every day | ORAL | Status: DC
Start: 2020-10-09 — End: 2020-10-16
  Administered 2020-10-09 – 2020-10-16 (×8): 25 mg via ORAL
  Filled 2020-10-08 (×8): qty 1

## 2020-10-08 MED ORDER — ACETAMINOPHEN 325 MG PO TABS
650.0000 mg | ORAL_TABLET | Freq: Four times a day (QID) | ORAL | Status: DC | PRN
  Administered 2020-10-09 – 2020-10-10 (×2): 650 mg via ORAL
  Filled 2020-10-08 (×4): qty 2

## 2020-10-08 MED ORDER — ACETAMINOPHEN 650 MG RE SUPP: 650.0000 mg | Freq: Four times a day (QID) | RECTAL | Status: DC | PRN

## 2020-10-08 MED ORDER — TORSEMIDE 20 MG PO TABS
60.0000 mg | ORAL_TABLET | Freq: Every day | ORAL | Status: DC
  Administered 2020-10-09 – 2020-10-13 (×5): 60 mg via ORAL
  Filled 2020-10-08 (×5): qty 3

## 2020-10-08 MED ORDER — FUROSEMIDE 10 MG/ML IJ SOLN
60.0000 mg | Freq: Once | INTRAMUSCULAR | Status: AC
  Administered 2020-10-08: 60 mg via INTRAVENOUS
  Filled 2020-10-08: qty 6

## 2020-10-08 NOTE — Plan of Care (Signed)

## 2020-10-08 NOTE — ED Triage Notes (Signed)
Patient with history of heart failure complains of shortness of breath that started one week ago, tested positive for COVID in January. Tachypneic in triage with room air SpO2 of 86%. Placed on 2L [place]. SpO2 95% on 2L [place], respiratory rate slowed, reports improvement in dyspnea.

## 2020-10-08 NOTE — ED Provider Notes (Signed)
[place] EMERGENCY DEPARTMENT Provider Note   CSN: [id number] Arrival date & time: 10/08/20  1017     History Chief Complaint  Patient presents with  . Shortness of Breath    Phillip Young is a 35 y.o. male.  Patient is a 35 year old male with a history of biventricular CHF, GERD, OSA who is on multiple congestive heart failure medications and diuretics presenting today with a 2-week history of worsening cough and shortness of breath.  Patient does not check his weight regularly but does not feel like the swelling in his lower extremities are much worse.  He did see his heart doctor 2 weeks ago and at that time they increased his torsemide which he has been doing ever since.  However he reports the cough has worsened and he is now short of breath with any minor activity.  Today he noticed a little bit of productive sputum that was white and noticed a temperature of 99-100 yesterday.  He does note that he had Covid at the beginning of January but is not aware of any specific complications.  Upon arrival to the emergency room oxygen saturation was 88% and he was placed on 2 L.  However he continued to drop to the 80s and was placed on 4 L with improvement of his symptoms.  He denies any chest pain, abdominal pain, nausea or vomiting.  He does not have a history of asthma or use inhalers at home.  He has quit smoking some years ago.  The history is provided by the patient.  Shortness of Breath Severity:  Severe Onset quality:  Gradual Duration:  2 weeks Timing:  Constant Progression:  Worsening Chronicity:  Recurrent Context: activity and URI   Relieved by:  Oxygen and sitting up Worsened by:  Activity and exertion Ineffective treatments:  None tried Associated symptoms: cough, fever and sputum production   Associated symptoms: no abdominal pain and no wheezing        Past Medical History:  Diagnosis Date  . Biventricular congestive heart failure ([place])     Last Echo 11/2019 at [facility] reveals EF 20%  . Class 3 severe obesity due to excess calories with serious comorbidity and body mass index (BMI) of 50.0 to 59.9 in adult ([place]) 02/26/2020  . Essential hypertension 02/26/2020  . GERD without esophagitis 02/26/2020  . Hidradenitis suppurativa 02/26/2020  . OSA (obstructive sleep apnea) 02/26/2020  . Prediabetes 02/26/2020    Patient Active Problem List   Diagnosis Date Noted  . Acute on chronic systolic congestive heart failure ([place])   . Hypokalemia   . Biventricular heart failure ([place])   . Chronic cough   . Acute on chronic systolic (congestive) heart failure ([place]) 02/26/2020  . Morbid obesity with body mass index (BMI) of 60.0 to 69.9 in adult ([place]) 02/26/2020  . Essential hypertension 02/26/2020  . GERD without esophagitis 02/26/2020  . Diuretic-induced hypokalemia 02/26/2020  . OSA (obstructive sleep apnea) 02/26/2020  . Hidradenitis suppurativa 02/26/2020  . Prediabetes 02/26/2020    Past Surgical History:  Procedure Laterality Date  . ABSCESS DRAINAGE    . RIGHT/LEFT HEART CATH AND CORONARY ANGIOGRAPHY N/A 03/04/2020   Procedure: RIGHT/LEFT HEART CATH AND CORONARY ANGIOGRAPHY;  Surgeon: Larey Dresser, MD;  Location: [place] CV LAB;  Service: Cardiovascular;  Laterality: N/A;       Family History  Problem Relation Age of Onset  . Heart disease Mother   . Hypertension Mother   . Pulmonary Hypertension  Mother   . Drug abuse Father        died due to Heroin overdose    Social History   Tobacco Use  . Smoking status: Former Research scientist (life sciences)  . Smokeless tobacco: Former Systems developer  . Tobacco comment: quit in 2019  Substance Use Topics  . Alcohol use: Never  . Drug use: Never    Home Medications Prior to Admission medications   Medication Sig Start Date End Date Taking? Authorizing Provider  dapagliflozin propanediol (FARXIGA) 10 MG TABS tablet Take 1 tablet (10 mg total) by mouth daily. 04/16/20   Bensimhon, Shaune Pascal, MD  digoxin  (LANOXIN) 0.125 MG tablet Take 1 tablet (0.125 mg total) by mouth daily. 04/16/20   Bensimhon, Shaune Pascal, MD  ivabradine (CORLANOR) 7.5 MG TABS tablet Take 1 tablet (7.5 mg total) by mouth 2 (two) times daily with a meal. 05/22/20   Larey Dresser, MD  Potassium Chloride ER 20 MEQ TBCR Take 20 mEq by mouth 2 (two) times daily. 04/16/20 06/17/20  Bensimhon, Shaune Pascal, MD  sacubitril-valsartan (ENTRESTO) 97-103 MG Take 1 tablet by mouth 2 (two) times daily. 10/02/20   Rafael Bihari, FNP  spironolactone (ALDACTONE) 25 MG tablet TAKE 1 TABLET(25 MG) BY MOUTH DAILY 07/14/20   Bensimhon, Shaune Pascal, MD  torsemide (DEMADEX) 20 MG tablet Take 60 mg by mouth daily.    [provider]    Allergies    Coreg [carvedilol]  Review of Systems   Review of Systems  Constitutional: Positive for fever.  Respiratory: Positive for cough, sputum production and shortness of breath. Negative for wheezing.   Gastrointestinal: Negative for abdominal pain.  All other systems reviewed and are negative.   Physical Exam Updated Vital Signs BP 137/74   Pulse (!) 101   Temp 98.2 F (36.8 C)   Resp 15   Ht 6' (1.829 m)   Wt (!) 211.4 kg   SpO2 96%   BMI 63.20 kg/m   Physical Exam Vitals and nursing note reviewed.  Constitutional:      General: He is not in acute distress.    Appearance: He is well-developed.  HENT:     Head: Normocephalic and atraumatic.     Mouth/Throat:     Mouth: Mucous membranes are moist.  Eyes:     Conjunctiva/sclera: Conjunctivae normal.     Pupils: Pupils are equal, round, and reactive to light.  Cardiovascular:     Rate and Rhythm: Regular rhythm. Tachycardia present.     Heart sounds: No murmur heard.   Pulmonary:     Effort: Pulmonary effort is normal. Tachypnea present. No respiratory distress.     Breath sounds: Normal breath sounds. No wheezing or rales.     Comments: Worse breath sounds throughout with some mild rales.  Wet cough throughout exam Abdominal:      General: There is no distension.     Palpations: Abdomen is soft.     Tenderness: There is no abdominal tenderness. There is no guarding or rebound.  Musculoskeletal:        General: No tenderness. Normal range of motion.     Cervical back: Normal range of motion and neck supple.     Right lower leg: Edema present.     Left lower leg: Edema present.  Skin:    General: Skin is warm and dry.     Findings: No erythema or rash.  Neurological:     Mental Status: He is alert and oriented to person,  place, and time. Mental status is at baseline.  Psychiatric:        Mood and Affect: Mood normal.        Behavior: Behavior normal.        Thought Content: Thought content normal.     ED Results / Procedures / Treatments   Labs (all labs ordered are listed, but only abnormal results are displayed) Labs Reviewed  BASIC METABOLIC PANEL - Abnormal; Notable for the following components:      Result Value   Sodium 132 (*)    Potassium 3.4 (*)    Chloride 96 (*)    Glucose, Bld 137 (*)    All other components within normal limits  DIGOXIN LEVEL - Abnormal; Notable for the following components:   Digoxin Level 0.2 (*)    All other components within normal limits  HEPATIC FUNCTION PANEL - Abnormal; Notable for the following components:   Albumin 3.3 (*)    AST 44 (*)    Total Bilirubin 1.3 (*)    Indirect Bilirubin 1.1 (*)    All other components within normal limits  TROPONIN I (HIGH SENSITIVITY) - Abnormal; Notable for the following components:   Troponin I (High Sensitivity) 25 (*)    All other components within normal limits  SARS CORONAVIRUS 2 (TAT 6-24 HRS)  CBC  BRAIN NATRIURETIC PEPTIDE  PROCALCITONIN  TROPONIN I (HIGH SENSITIVITY)    EKG EKG Interpretation  Date/Time:  Wednesday October 08 2020 11:09:28 EDT Ventricular Rate:  100 PR Interval:    QRS Duration: 94 QT Interval:  343 QTC Calculation: 443 R Axis:   107 Text Interpretation: Sinus tachycardia Biatrial  enlargement Right axis deviation recurrent Borderline T abnormalities, inferior leads Confirmed by Blanchie Dessert [id number]) on 10/08/2020 12:46:17 PM   Radiology DG Chest 2 View  Result Date: 10/08/2020 CLINICAL DATA:  Dyspnea.  Productive cough.  Fever/body aches. EXAM: CHEST - 2 VIEW COMPARISON:  07/29/2020. FINDINGS: New consolidation in the right lower lung. No visible pleural effusions or pneumothorax. Similar enlarged cardiac silhouette. Similar pulmonary vascular congestion. IMPRESSION: 1. New consolidation in the right lower lung, concerning for pneumonia. Recommend imaging follow-up to resolution. 2. Similar cardiomegaly and pulmonary vascular congestion. Electronically Signed   By: Margaretha Sheffield MD   On: 10/08/2020 11:14    Procedures Procedures   Medications Ordered in ED Medications  furosemide (LASIX) injection 60 mg (has no administration in time range)  cefTRIAXone (ROCEPHIN) 1 g in sodium chloride 0.9 % 100 mL IVPB (has no administration in time range)  azithromycin (ZITHROMAX) 500 mg in sodium chloride 0.9 % 250 mL IVPB (has no administration in time range)    ED Course  I have reviewed the triage vital signs and the nursing notes.  Pertinent labs & imaging results that were available during my care of the patient were reviewed by me and considered in my medical decision making (see chart for details).    MDM Rules/Calculators/A&P                          35 year old male with known congestive heart failure on multiple medications presenting today with 2 weeks of worsening cough and shortness of breath.  Patient does note low-grade fever yesterday and some mild sputum production today.  However he reports the cough is just worsening and now he can walk only a few steps before becoming winded.  Patient was hypoxic upon arrival to the emergency room and has  a wet sounding cough.  He is mentating normally and sats on 4 L are now 96 to 97%.  He is mildly tachycardic but  the sinus rhythm.  No acute EKG changes.  Low suspicion for ACS today however concern for CHF exacerbation versus infectious etiology.  Patient did have Covid at the beginning of January and lower suspicion for Covid however on x-ray today it does show that he has new concern for right lower lobe pneumonia.  Given he had had low-grade temperatures and productive cough we will treat with azithromycin and Rocephin however also concern for fluid overload which he reports has caused significant coughing in the past as well.  This is despite taking increased torsemide for the last 2 weeks.  There is vascular congestion and cardiomegaly present on chest x-ray.  Last echo was 06/17/2020 and at that time an EF of 20 to 25%.  Plan will be to admit as patient is currently requiring oxygen.  He was given IV Lasix in addition to the antibiotics.  BMP with stable creatinine and potassium of 3.4, CBC without acute findings.  Troponin, BMP and LFTs are pending.  1:36 PM Trop is mildly elevate at 25 but baseline.  BNP slightly improved from prior.  lft's wnl.  Will admit for hypoxia and cap  MDM Number of Diagnoses or Management Options   Amount and/or Complexity of Data Reviewed Clinical lab tests: ordered and reviewed Tests in the radiology section of CPT: ordered and reviewed Tests in the medicine section of CPT: reviewed and ordered Decide to obtain previous medical records or to obtain history from someone other than the patient: yes Obtain history from someone other than the patient: yes Review and summarize past medical records: yes Discuss the patient with other providers: yes Independent visualization of images, tracings, or specimens: yes  Risk of Complications, Morbidity, and/or Mortality Presenting problems: high Diagnostic procedures: moderate Management options: moderate  Patient Progress Patient progress: stable   Final Clinical Impression(s) / ED Diagnoses Final diagnoses:  Community  acquired pneumonia of right middle lobe of lung  Acute respiratory failure with hypoxia ([place])    Rx / DC Orders ED Discharge Orders    None       Blanchie Dessert, MD 10/08/20 1339

## 2020-10-08 NOTE — ED Notes (Signed)
Pt coming to room from Xray

## 2020-10-08 NOTE — H&P (Addendum)
Date: 10/08/2020               Patient Name:  Phillip Young MRN: [id number]  DOB: 07/20/1986 Age / Sex: 35 y.o., male   PCP: Patient, No Pcp Per         Medical Service: Internal Medicine Teaching Service         Attending Physician: Dr. Aldine Contes, MD    First Contact: Sanjuana Letters, DO Pager: Maryje[facility] Morn [phone]  Second Contact: Marianna Payment, DO Pager: [facility] [phone]       After Hours (After 5p/  First Contact Pager: [phone]  weekends / holidays): Second Contact Pager: [phone]   SUBJECTIVE   Chief Complaint: Cough, weight gain  History of Present Illness:  Phillip Young is a 35 year old male with past medical history of biventricular heart failure, diabetes, HLD, OSA, recent Covid infection presented to the ED today with dyspnea as well as dry cough for the past 2 weeks.  Patient states he has had a persistent nonproductive cough since his Covid infection 3-1/2 months ago.  He did not have to be admitted for this Covid infection.  He states that over the past 2 weeks his cough has worsened, leading him to be evaluated in the emergency department.  He also endorses worsening dyspnea.  He states that he is unable to lie flat and that is unchanged from his baseline.  Dyspnea on exertion has worsened.  Endorses temperatures of 99-100 Fahrenheit.  He denied any chills, chest pain, nausea, vomiting, diarrhea.  Denies lower extremity swelling being worse than usual.  States he has not any difficulty urinating, and feels as though his fluid pills are working.  Passing his bowels okay.  Denies sick contacts.  States he has had a chronic cough since his Covid infection.  Notes baseline weight of 440 lbs. tries to weight himself daily but does not believe his home scale is accurate.  He has been admitted in the past for CHF exacerbation.  He recently was instructed to double his torsemide 2 weeks ago due to weight gain.  He endorses good urinary output with this.  ED Course: Patient hypoxic on  arrival, saturating above 90% on 4 L.  Chest x-ray revealed a new right lower lobe opacity that was concerning for possible pneumonia.  He was started on azithromycin and ceftriaxone due to concern for possible pneumonia.  He was also found to have vascular congestion and cardiomegaly.  He received IV Lasix in the ED.  Creatinine stable and potassium of 3.4.  CBC without acute findings.  Troponins flat.  BNP is 90 (elevated in the past in the 300-400s.)  Liver enzymes within normal limits   Lab Orders     SARS CORONAVIRUS 2 (TAT 6-24 HRS) Nasopharyngeal Nasopharyngeal Swab     Respiratory (~20 pathogens) panel by PCR     Basic metabolic panel     CBC     Digoxin level     Hepatic function panel     Brain natriuretic peptide     Procalcitonin - Baseline     Procalcitonin     CBC     Basic metabolic panel   Meds:  Current Meds  Medication Sig  . CORLANOR 5 MG TABS tablet Take 7.5 mg by mouth 2 (two) times daily.  . dapagliflozin propanediol (FARXIGA) 10 MG TABS tablet Take 1 tablet (10 mg total) by mouth daily.  . digoxin (LANOXIN) 0.125 MG tablet Take 1 tablet (0.125 mg total) by mouth daily.  Marland Kitchen  Potassium Chloride ER 20 MEQ TBCR Take 20 mEq by mouth 2 (two) times daily.  . sacubitril-valsartan (ENTRESTO) 49-51 MG Take 1 tablet by mouth 2 (two) times daily.  Marland Kitchen spironolactone (ALDACTONE) 25 MG tablet TAKE 1 TABLET(25 MG) BY MOUTH DAILY (Patient taking differently: Take 25 mg by mouth daily.)  . torsemide (DEMADEX) 20 MG tablet Take 60 mg by mouth daily.  . [DISCONTINUED] ivabradine (CORLANOR) 7.5 MG TABS tablet Take 1 tablet (7.5 mg total) by mouth 2 (two) times daily with a meal. (Patient taking differently: Take 11.25 mg by mouth 2 (two) times daily with a meal.)    Social:  Lives With: Family Occupation: Bus driver Level of Function: Able to form all of his own ADLs PCP: None Substances: Quit smoking in Oct 2019, used to smoke about 6 cigars per day for about 2 years, smoked  Newports prior to that since the age of [age], [age], about 1/2 ppd. Denies EtOH use or recreational drug use.    Family History: Mother: Pulm HTN. Father: Used heroin, passed away.   Allergies: Allergies as of 10/08/2020 - Review Complete 10/08/2020  Allergen Reaction Noted  . Coreg [carvedilol] Shortness Of Breath and Diarrhea 07/07/2020   Past Medical History:  Diagnosis Date  . Biventricular congestive heart failure ([place])    Last Echo 11/2019 at [facility] reveals EF 20%  . Class 3 severe obesity due to excess calories with serious comorbidity and body mass index (BMI) of 50.0 to 59.9 in adult ([place]) 02/26/2020  . Essential hypertension 02/26/2020  . GERD without esophagitis 02/26/2020  . Hidradenitis suppurativa 02/26/2020  . OSA (obstructive sleep apnea) 02/26/2020  . Prediabetes 02/26/2020    Review of Systems: A complete ROS was negative except as per HPI.   OBJECTIVE:   Physical Exam: Blood pressure 125/68, pulse 98, temperature 98.2 F (36.8 C), resp. rate 20, height 6' (1.829 m), weight (!) 211.4 kg, SpO2 93 %. Constitutional: Mild distress with coughing HENT: normocephalic atraumatic, mucous membranes moist Eyes: conjunctiva non-erythematous Neck: supple.  JVD difficult to assess secondary to body habitus. Cardiovascular: Tachycardic, regular rhythm no m/r/g Pulmonary/Chest: 3 to 4 L nasal cannula breathing well.  Mild redness of bilateral lower extremity bases.  Difficult to assess due to body habitus Abdominal: soft, non-tender, non-distended MSK: normal bulk and tone Neurological: alert & oriented x 3, 5/5 strength in bilateral upper and lower extremities, normal gait Skin: warm and dry.  1+ lower extremity edema Psych: Normal thought process  Labs: CBC    Component Value Date/Time   WBC 7.2 10/08/2020 1031   RBC 4.51 10/08/2020 1031   HGB 14.0 10/08/2020 1031   HCT 41.8 10/08/2020 1031   PLT 265 10/08/2020 1031   MCV 92.7 10/08/2020 1031   MCH 31.0 10/08/2020  1031   MCHC 33.5 10/08/2020 1031   RDW 14.1 10/08/2020 1031   LYMPHSABS 1.0 07/29/2020 0141   MONOABS 0.7 07/29/2020 0141   EOSABS 0.1 07/29/2020 0141   BASOSABS 0.0 07/29/2020 0141     CMP     Component Value Date/Time   NA 132 (L) 10/08/2020 1031   K 3.4 (L) 10/08/2020 1031   CL 96 (L) 10/08/2020 1031   CO2 25 10/08/2020 1031   GLUCOSE 137 (H) 10/08/2020 1031   BUN 7 10/08/2020 1031   CREATININE 1.13 10/08/2020 1031   CALCIUM 8.9 10/08/2020 1031   PROT 8.1 10/08/2020 1115   ALBUMIN 3.3 (L) 10/08/2020 1115   AST 44 (H) 10/08/2020 1115  ALT 36 10/08/2020 1115   ALKPHOS 70 10/08/2020 1115   BILITOT 1.3 (H) 10/08/2020 1115   GFRNONAA >60 10/08/2020 1031   GFRAA >60 03/27/2020 1055    Imaging: DG Chest 2 View  Result Date: 10/08/2020 CLINICAL DATA:  Dyspnea.  Productive cough.  Fever/body aches. EXAM: CHEST - 2 VIEW COMPARISON:  07/29/2020. FINDINGS: New consolidation in the right lower lung. No visible pleural effusions or pneumothorax. Similar enlarged cardiac silhouette. Similar pulmonary vascular congestion. IMPRESSION: 1. New consolidation in the right lower lung, concerning for pneumonia. Recommend imaging follow-up to resolution. 2. Similar cardiomegaly and pulmonary vascular congestion. Electronically Signed   By: Margaretha Sheffield MD   On: 10/08/2020 11:14    EKG: personally reviewed my interpretation is sinus tachycardia, rate of 100.  Left atrial enlargement.  Compared to prior from January 2022.  No acute changes   ASSESSMENT & PLAN:    Assessment & Plan by Problem: Active Problems:   CAP (community acquired pneumonia)   Tyelor Parrales is a 35 y.o. with pertinent PMH of biventricular heart failure, diabetes, HLD, OSA, recent Covid infection who presented with dry cough, dyspnea on exertion and will admit for pneumonia versus acute CHF exacerbation on hospital day 0  Nonproductive cough Dyspnea on exertion Right lower lobe opacity Patient with  nonproductive cough and worsening dyspnea.  Chest x-ray with new right lower lobe opacity.  Difficult to assess whether or not this is pneumonia versus fluid.  Pro-Cal negative, will plan to discontinue antibiotics tomorrow.  We will continue heart failure medications, patient does not seem to be hypervolemic on exam, but this is difficult to assess due to his body habitus despite being 20 pounds over baseline.  He does not have an AKI nor an elevated BNP compared to prior, lowering my suspicion that his worsening dyspnea is from fluid.  Plan to continue home heart failure medications.  Will work-up for viral pneumonia.  Covid test negative.  Cough also be secondary to post Covid syndrome, but will work-up for other etiologies as this is diagnosis of exclusion.  New oxygen requirement on admission, will continue to monitor.  Low suspicion for pulmonary emboli. Low risk Geneva score.  In setting of recent Covid, and new oxygen requirement, will order D-dimer. -Given dose of azithromycin and ceftriaxone in the ED, will discontinue with negative pro-Cal -Urine Legionella/strep pneumo pending -Viral respiratory panel pending, Covid negative -D-dimer pending -Continue heart failure medications as per below -Robitussin for cough -Monitor fever, Tylenol for any continued fever -Wean oxygen as possible  Biventricular heart failure (EF 20-25%, 05/2020) Patient follow closely with heart failure team.  As per above do not believe that this is an acute CHF exacerbation, despite difference in weight.  Torsemide was doubled 2 weeks ago, patient with good urine output since.  BNP is elevated has been in the past. Patient states that he is adherent to his home medications, but his digoxin level is nontherapeutic. Continue to monitor and continue on home CHF medications. -Continue Entresto, Farxiga, spironolactone, torsemide -Strict I/O's, daily weights -Daily BMPs  History of tobacco use Patient been smoking  cigarettes/cigars on and off since he was 35 years old.  Patient may have some component of COPD that is contributing to his chronic cough.  Patient quit smoking October 2019. -Duo nebs every 4 hours as needed wheezing  OSA Continue home CPAP  Diet: Heart healthy VTE: Enoxaparin IVF: None,None Code: Full  Prior to Admission Living Arrangement: Home, living Family Anticipated Discharge Location:  Home Barriers to Discharge: Improvement of dyspnea  Dispo: Admit patient to Inpatient with expected length of stay greater than 2 midnights.  Signed: [name], MD Internal Medicine Resident PGY-1 Pager: [phone]  10/08/2020, 4:36 PM

## 2020-10-08 NOTE — Hospital Course (Signed)
  On sees heart failure Significant HFrEF  Heart Failure Exacerbation vs pneumonia       No PCP  Past Medical History:  Diagnosis Date   Biventricular congestive heart failure ([place])    Last Echo 11/2019 at [facility] reveals EF 20%   Class 3 severe obesity due to excess calories with serious comorbidity and body mass index (BMI) of 50.0 to 59.9 in adult [facility]) 02/26/2020   Essential hypertension 02/26/2020   GERD without esophagitis 02/26/2020   Hidradenitis suppurativa 02/26/2020   OSA (obstructive sleep apnea) 02/26/2020   Prediabetes 02/26/2020

## 2020-10-08 NOTE — ED Notes (Signed)
O2 decreased to 1.5L/[place]

## 2020-10-08 NOTE — ED Notes (Signed)
Weaned from 1.5L O2/[place] to room air

## 2020-10-09 ENCOUNTER — Inpatient Hospital Stay (HOSPITAL_COMMUNITY): Payer: BC Managed Care – PPO

## 2020-10-09 ENCOUNTER — Encounter (HOSPITAL_COMMUNITY): Payer: Self-pay | Admitting: Internal Medicine

## 2020-10-09 ENCOUNTER — Other Ambulatory Visit (HOSPITAL_COMMUNITY): Payer: BC Managed Care – PPO

## 2020-10-09 ENCOUNTER — Other Ambulatory Visit: Payer: Self-pay

## 2020-10-09 ENCOUNTER — Other Ambulatory Visit (HOSPITAL_COMMUNITY): Payer: Self-pay | Admitting: Internal Medicine

## 2020-10-09 DIAGNOSIS — R7989 Other specified abnormal findings of blood chemistry: Secondary | ICD-10-CM

## 2020-10-09 DIAGNOSIS — G4733 Obstructive sleep apnea (adult) (pediatric): Secondary | ICD-10-CM

## 2020-10-09 DIAGNOSIS — Z9989 Dependence on other enabling machines and devices: Secondary | ICD-10-CM

## 2020-10-09 DIAGNOSIS — J123 Human metapneumovirus pneumonia: Principal | ICD-10-CM

## 2020-10-09 DIAGNOSIS — I5082 Biventricular heart failure: Secondary | ICD-10-CM

## 2020-10-09 DIAGNOSIS — Z8616 Personal history of COVID-19: Secondary | ICD-10-CM

## 2020-10-09 LAB — GLUCOSE, CAPILLARY
Glucose-Capillary: 176 mg/dL — ABNORMAL HIGH (ref 70–99)
Glucose-Capillary: 132 mg/dL — ABNORMAL HIGH (ref 70–99)
Glucose-Capillary: 107 mg/dL — ABNORMAL HIGH (ref 70–99)
Glucose-Capillary: 124 mg/dL — ABNORMAL HIGH (ref 70–99)

## 2020-10-09 LAB — BASIC METABOLIC PANEL
Anion gap: 8 (ref 5–15)
BUN: 9 mg/dL (ref 6–20)
CO2: 27 mmol/L (ref 22–32)
Calcium: 8.7 mg/dL — ABNORMAL LOW (ref 8.9–10.3)
Chloride: 96 mmol/L — ABNORMAL LOW (ref 98–111)
Creatinine, Ser: 1.12 mg/dL (ref 0.61–1.24)
GFR, Estimated: >60 mL/min (ref >60)
Glucose, Bld: 118 mg/dL — ABNORMAL HIGH (ref 70–99)
Potassium: 3.6 mmol/L (ref 3.5–5.1)
Sodium: 131 mmol/L — ABNORMAL LOW (ref 135–145)

## 2020-10-09 LAB — CBC
HCT: 41.1 % (ref 39.0–52.0)
Hemoglobin: 13.3 g/dL (ref 13.0–17.0)
MCH: 30.3 pg (ref 26.0–34.0)
MCHC: 32.4 g/dL (ref 30.0–36.0)
MCV: 93.6 fL (ref 80.0–100.0)
Platelets: 252 10*3/uL (ref 150–400)
RBC: 4.39 MIL/uL (ref 4.22–5.81)
RDW: 13.9 % (ref 11.5–15.5)
WBC: 6.1 10*3/uL (ref 4.0–10.5)
nRBC: 0 % (ref 0.0–0.2)

## 2020-10-09 LAB — PROCALCITONIN: Procalcitonin: <0.1 ng/mL

## 2020-10-09 IMAGING — CT CT ANGIO CHEST
2 of 6 series · 18 of 46 positions shown · IV contrast (omnipaque)
Comparison: Chest radiography yesterday.  Previous CT [DATE]

CLINICAL DATA: Positive D-dimer.  Question pulmonary emboli.

EXAM:
CT ANGIOGRAPHY CHEST WITH CONTRAST
TECHNIQUE: Multidetector CT imaging of the chest was performed using the
standard protocol during bolus administration of intravenous
contrast. Multiplanar CT image reconstructions and MIPs were
obtained to evaluate the vascular anatomy.
CONTRAST:  100mL OMNIPAQUE IOHEXOL 350 MG/ML SOLN

[Series 6: thins · axial · 0.98mm/px · z∈[+1418,+1704]mm · 15 of 314 slices shown]
[im 14/314  lung]
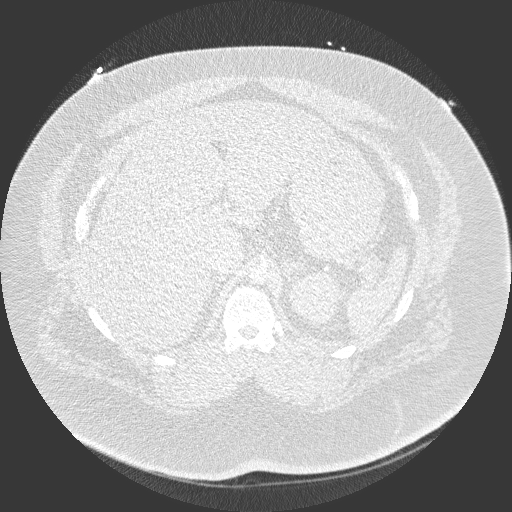
[im 41/314  soft-tissue]
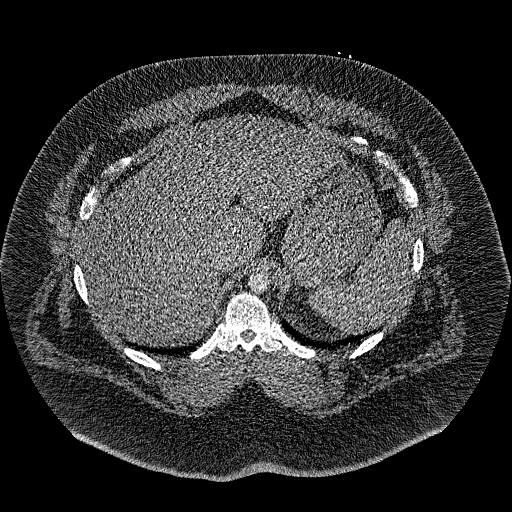
[im 55/314  lung]
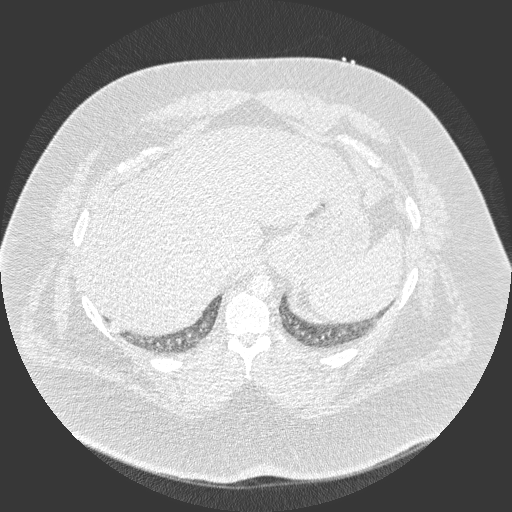
[im 82/314  soft-tissue]
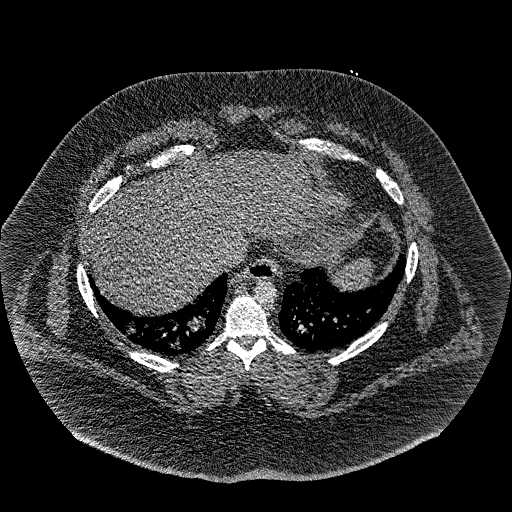
[im 96/314  lung]
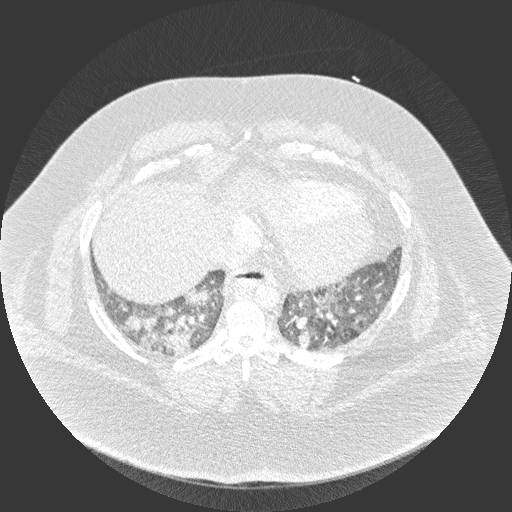
[im 123/314  soft-tissue]
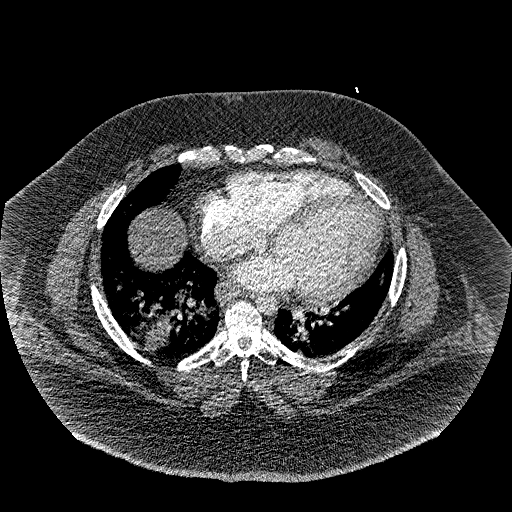
[im 137/314  lung]
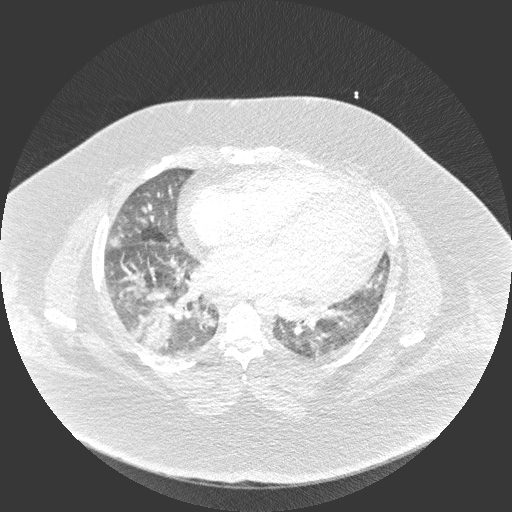
[im 164/314  soft-tissue]
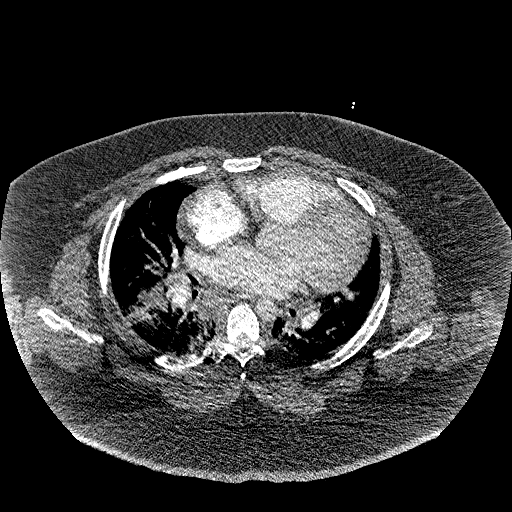
[im 177/314  lung]
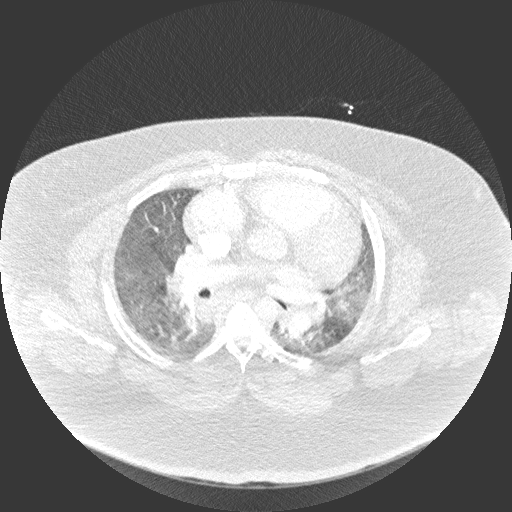
[im 191/314  soft-tissue]
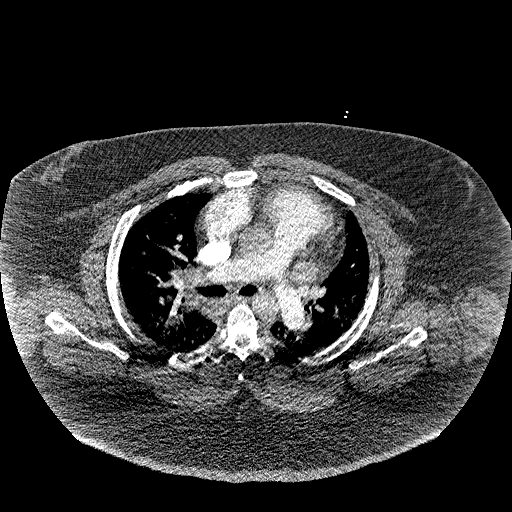
[im 218/314  lung]
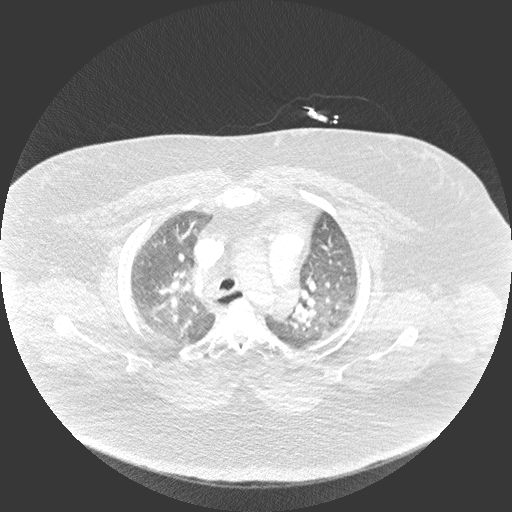
[im 232/314  soft-tissue]
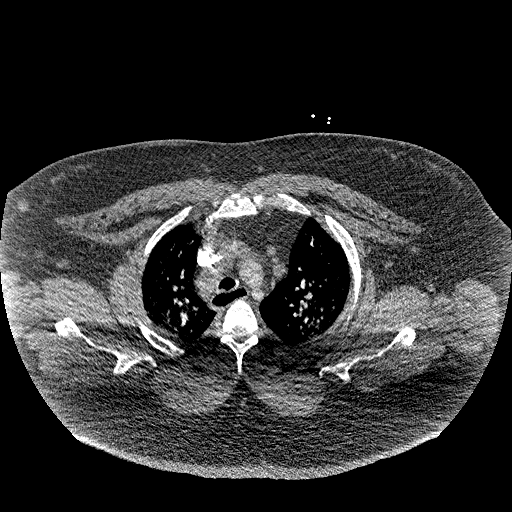
[im 259/314  lung]
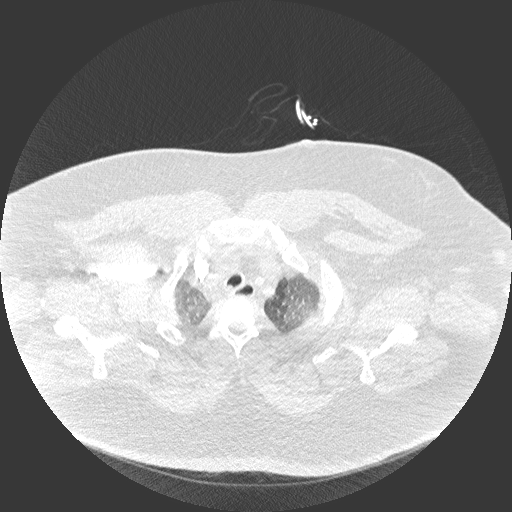
[im 273/314  soft-tissue]
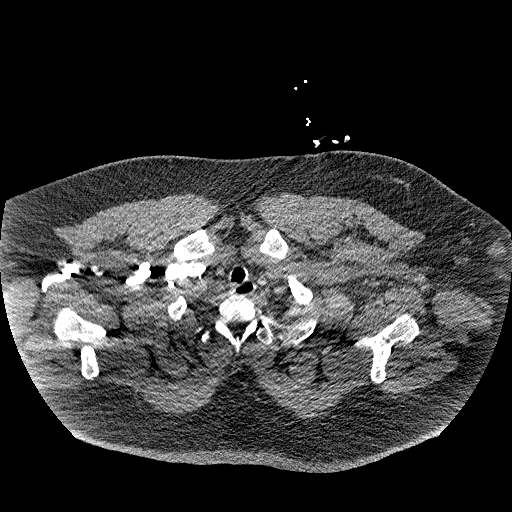
[im 300/314  lung]
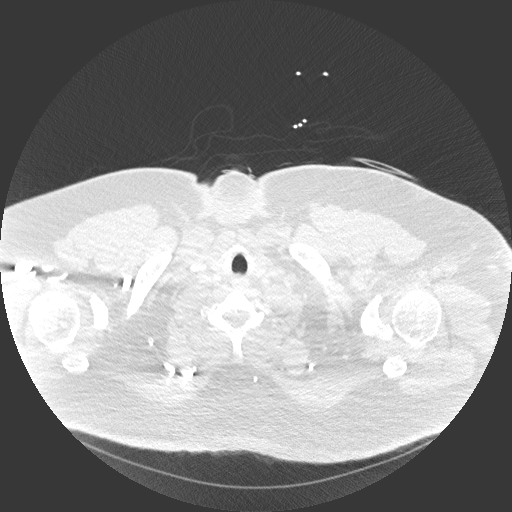

[Series 8: coronal mpr · coronal · 0.61mm/px · 3 of 161 slices shown]
[im 41/161  soft-tissue]
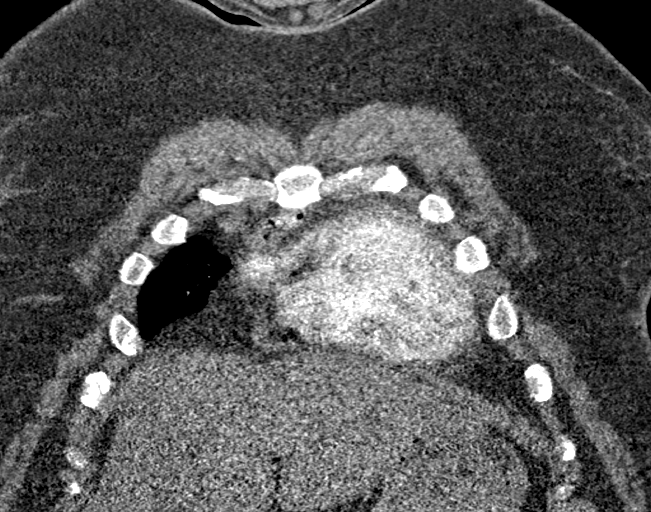
[im 81/161  soft-tissue]
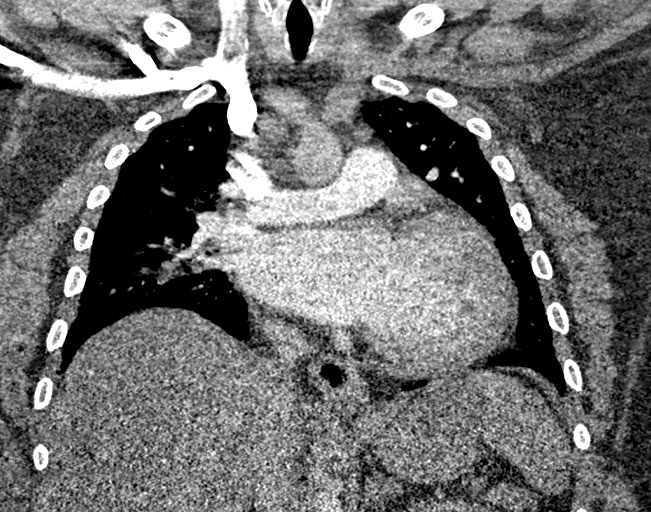
[im 121/161  soft-tissue]
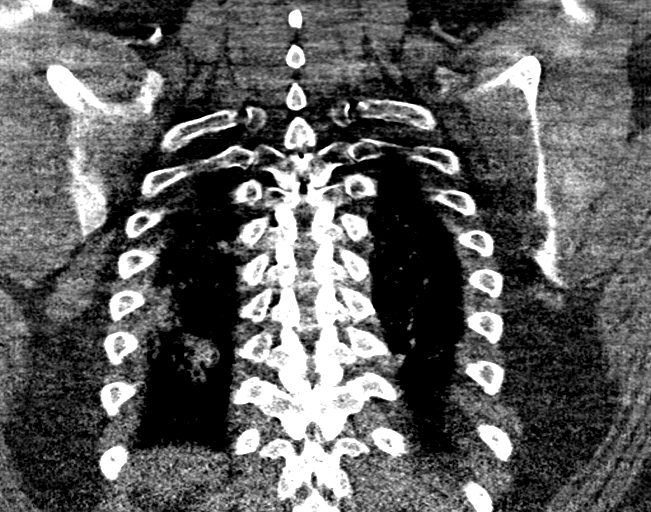

[18 of 46 positions shown; findings below may reference images not displayed]

FINDINGS: Cardiovascular: Cardiomegaly. Small amount of pericardial fluid. No
visible coronary artery calcification or aortic atherosclerotic
calcification. Pulmonary arterial opacification is moderate. No
central pulmonary emboli are seen. Small peripheral emboli could not
be excluded given this degree of arterial opacification.

Mediastinum/Nodes: No mediastinal or hilar mass. Mild nodal
prominence, likely reactive.

Lungs/Pleura: Bilateral lower lobe bronchopneumonia or extensive on
the right than the left. No pleural effusion.

Upper Abdomen: Negative

Musculoskeletal: Negative

Review of the MIP images confirms the above findings.
IMPRESSION: 1. Pulmonary arterial opacification is moderate. No central
pulmonary emboli are seen. Small peripheral emboli could not be
excluded given this degree of arterial opacification.
2. Cardiomegaly. Small amount of pericardial fluid.
3. Bilateral lower lobe bronchopneumonia more extensive on the right
than the left.

## 2020-10-09 MED ORDER — INFLUENZA VAC SPLIT QUAD 0.5 ML IM SUSY
0.5000 mL | PREFILLED_SYRINGE | INTRAMUSCULAR | Status: AC
  Administered 2020-10-12: 0.5 mL via INTRAMUSCULAR
  Filled 2020-10-09 (×2): qty 0.5

## 2020-10-09 MED ORDER — ENOXAPARIN SODIUM 100 MG/ML ~~LOC~~ SOLN
100.0000 mg | Freq: Every day | SUBCUTANEOUS | Status: DC
  Administered 2020-10-09 – 2020-10-10 (×2): 100 mg via SUBCUTANEOUS
  Filled 2020-10-09 (×2): qty 1

## 2020-10-09 MED ORDER — IOHEXOL 350 MG/ML SOLN
100.0000 mL | Freq: Once | INTRAVENOUS | Status: AC | PRN
  Administered 2020-10-09: 100 mL via INTRAVENOUS

## 2020-10-09 MED ORDER — KETOROLAC TROMETHAMINE 15 MG/ML IJ SOLN
15.0000 mg | Freq: Once | INTRAMUSCULAR | Status: AC
  Administered 2020-10-09: 15 mg via INTRAVENOUS
  Filled 2020-10-09: qty 1

## 2020-10-09 NOTE — Progress Notes (Signed)
  Date: 10/09/2020  Patient name: Phillip Young  Medical record number: [id number]  Date of birth: Jun 18, 1986   I have seen and evaluated Phillip Young and discussed their care with the Residency Team.  In brief, patient is a 35 year old male with a possible history of chronic combined systolic and diastolic heart failure, type 2 diabetes, hyperlipidemia, OSA and recent Covid infection who presented to the ED with worsening cough and shortness of breath over the last couple of weeks.  Patient states that since he was diagnosed with Covid approximately 3 and half months ago he has noted persistent nonproductive cough.  Over the last couple weeks patient has noted progressively worsening cough with associated dyspnea on exertion.  Patient also states that he is unable to lie flat and that is unchanged from his baseline.  Patient also states that he has some associated low-grade fevers.  No chills, no chest pain, no nausea or vomiting, no diarrhea, no abdominal pain, no lightheadedness, no syncope, no focal weakness.  Today, patient states that he feels a little better and that her shortness of breath and cough are slowly improving.  He still complains of dyspnea on minimal exertion.  PMHx, Fam Hx, and/or Soc Hx : As per resident admit note  Vitals:   10/09/20 1022 10/09/20 1218  BP: 124/66 105/83  Pulse: 93 88  Resp: 20 20  Temp: 99 F (37.2 C) 98.8 F (37.1 C)  SpO2: 95% 97%   General: Awake, alert, and x3, NAD CVS: Regular rhythm, heart sounds Lungs: Distant breath sounds, clear to auscultation bilaterally Abdomen: Soft, obese, nontender, nondistended, normoactive bowel sounds Extremities: Trace to 1+ bilateral lower extremity pitting edema noted worse on the left than the right, nontender to palpation Psych: Normal mood and affect HEENT: Normocephalic, atraumatic Skin: Warm and dry  Assessment and Plan: I have seen and evaluated the patient as outlined above. I agree with the  formulated Assessment and Plan as detailed in the residents' note, with the following changes:   1.  Viral pneumonia: -Patient presented to ED with progressively worsening cough and shortness of breath with dyspnea on exertion as well as low-grade fevers and was noted to have a right lower lobe infiltrate on imaging.  Respiratory viral panel was positive for metapneumovirus.  Covid test was negative and procalcitonin was less than 0.1 making bacterial pneumonia unlikely -Antibiotics were DC'd today -CTA chest was done secondary to elevated D-dimer and tachycardia as well as shortness of breath.  CTA showed no evidence of PE but bilateral lower lobe bronchopneumonia worse on the right.  This would be consistent with a viral pneumonia (metapneumovirus). -There was concern for heart failure exacerbation initially on admission but patient does not appear to be grossly volume overloaded and his BNP is within normal limits which would make this unlikely (BNP can be on the lower side in patients who are obese but he was noted to have an elevated BNP when he had a heart failure exacerbation a couple months ago). -We will continue with current medication regimen including Entresto, digoxin, ivabradine, Farxiga, spironolactone and torsemide for his chronic heart failure treatment. -Continue with supportive care for his viral pneumonia including guaifenesin and duo nebs as needed as well as Tylenol for fevers -No further work-up at this time -I suspect patient should be stable for DC home in the next 1 to 2 days if he continues to improve  Phillip Contes, MD 3/17/20223:54 PM

## 2020-10-09 NOTE — Progress Notes (Signed)
Pt on edge of bed, dozing off.  Assisted to recliner to prevent falling forward.  SPO2 fluctuating d/t reclined position and sleeping.  Pt appreciative of more comfortable position to sleep in.  Aware that Pt needs CT this morning.  Will cont plan of care.

## 2020-10-09 NOTE — Progress Notes (Signed)
Patient requested Covid and pneumonia vaccines. MD will advise if patient is able to receive those vaccinations during this hospital stay with current diagnosis.

## 2020-10-09 NOTE — Progress Notes (Signed)
HD#1 Subjective:  Overnight Events: No new events overnight.   Patient reports that his breathing is doing better. States that the cough medication helped. He reports he did not wear a CPAP last night, states he wasn't able to sleep much.States that he got short of breath even getting up to the sink.  He stated that someone was going to bring in his CPAP tonight to use.  We discussed that he has a viral pneumonia and that this needs to gett supportive care. We discussed that this was likely not related to his COVID infection. We discussed that this may take a few weeks to get better.   Objective:  Vital signs in last 24 hours: Vitals:   10/08/20 2117 10/08/20 2318 10/09/20 0055 10/09/20 0318  BP:  130/66 112/78 124/75  Pulse: 86 98 94 87  Resp: 19 (!) '21 20 18  '$ Temp:  99.9 F (37.7 C) 99.4 F (37.4 C) 98.3 F (36.8 C)  TempSrc:  Oral Oral Oral  SpO2: 92% 95% 96% 94%  Weight:      Height:       Supplemental O2: Nasal Cannula SpO2: 94 % O2 Flow Rate (L/min): 4 L/min   Physical Exam:  Constitutional: Sitting in recliner comfortably HENT: normocephalic atraumatic Eyes: conjunctiva non-erythematous Neck: supple Cardiovascular: regular rate and rhythm, no m/r/g Pulmonary/Chest: Breathing comfortably on 4 L, diminished breath sounds throughout.  Clear otherwise Abdominal: soft, non-tender, non-distended MSK: normal bulk and tone Neurological: alert & oriented x 3 Skin: warm and dry.  Trace to 1+ bilateral lower extremity pitting edema worse on the left. Psych: Normal mood  Filed Weights   10/08/20 1120  Weight: (!) 211.4 kg     Intake/Output Summary (Last 24 hours) at 10/09/2020 0726 Last data filed at 10/08/2020 1410 Gross per 24 hour  Intake 450 ml  Output 950 ml  Net -500 ml   Net IO Since Admission: -500 mL [10/09/20 0726]  Pertinent Labs: CBC Latest Ref Rng & Units 10/09/2020 10/08/2020 07/29/2020  WBC 4.0 - 10.5 K/uL 6.1 7.2 6.3  Hemoglobin 13.0 - 17.0 g/dL  13.3 14.0 14.2  Hematocrit 39.0 - 52.0 % 41.1 41.8 43.6  Platelets 150 - 400 K/uL 252 265 249    CMP Latest Ref Rng & Units 10/09/2020 10/08/2020 10/02/2020  Glucose 70 - 99 mg/dL 118(H) 137(H) 115(H)  BUN 6 - 20 mg/dL '9 7 8  '$ Creatinine 0.61 - 1.24 mg/dL 1.12 1.13 0.95  Sodium 135 - 145 mmol/L 131(L) 132(L) 136  Potassium 3.5 - 5.1 mmol/L 3.6 3.4(L) 3.5  Chloride 98 - 111 mmol/L 96(L) 96(L) 100  CO2 22 - 32 mmol/L '27 25 28  '$ Calcium 8.9 - 10.3 mg/dL 8.7(L) 8.9 9.3  Total Protein 6.5 - 8.1 g/dL - 8.1 -  Total Bilirubin 0.3 - 1.2 mg/dL - 1.3(H) -  Alkaline Phos 38 - 126 U/L - 70 -  AST 15 - 41 U/L - 44(H) -  ALT 0 - 44 U/L - 36 -    Imaging: DG Chest 2 View  Result Date: 10/08/2020 CLINICAL DATA:  Dyspnea.  Productive cough.  Fever/body aches. EXAM: CHEST - 2 VIEW COMPARISON:  07/29/2020. FINDINGS: New consolidation in the right lower lung. No visible pleural effusions or pneumothorax. Similar enlarged cardiac silhouette. Similar pulmonary vascular congestion. IMPRESSION: 1. New consolidation in the right lower lung, concerning for pneumonia. Recommend imaging follow-up to resolution. 2. Similar cardiomegaly and pulmonary vascular congestion. Electronically Signed   By: Margaretha Sheffield  MD   On: 10/08/2020 11:14    Assessment/Plan:   Active Problems:   CAP (community acquired pneumonia)   Patient Summary: Phillip Young is a 35 y.o. with pertinent PMH of biventricular heart failure, diabetes, HLD, OSA, recent Covid infection who presented with dry cough, dyspnea on exertion and will admit for pneumonia versus acute CHF exacerbation.  Found to have viral pneumonia.  Pneumometavirus Lower respiratory infection Positive for PMV on respiratory panel.  Continue conservative treatment. -Discontinue antibiotics -Robitussin for cough -Wean oxygen as tolerated -Contact precautions  Biventricular heart failure (EF 20-25%, 05/2020) We will continue on home medications as per below.  Lower  extremity edema is trace to 1+.  We will continue to monitor daily. -Continue Delene Loll, Farxiga, spironolactone, torsemide -I/O - -500 -Daily Weight - Pending Admission Weight - 211.4 Kg -Strict I/O's, daily weights -Daily BMPs  OSA Continue home CPAP.  Patient states he will have family member bring his home CPAP  Elevated D-dimer In setting of patient's new oxygen requirement recent Covid infection, a D-dimer was performed.  This was positive, so a chest CTA was ordered.  This was negative for any acute clot.  We will continue to monitor and keep on DVT prophylaxis.  Diet: Heart Healthy IVF: None,None VTE: Enoxaparin Code: Full PT/OT recs: None,  Dispo: Anticipated discharge to Home in 3 days pending improvement of hypoxia  [facility] Internal Medicine Resident PGY-1 Pager [phone] Please contact the on call pager after 5 pm and on weekends at [phone].

## 2020-10-09 NOTE — Progress Notes (Signed)
Patient has gone down for CT scan twice. When CT flushes the IV, both times the IV has blown. Pt is a difficult start and has been requiring ultrasound to get an IV. Will send a new request.

## 2020-10-09 NOTE — Progress Notes (Signed)
Notified Harvie Heck MD, in reference to contact precautions. Pt tested positive for Metapneumovirus and MD stated the patient needed to be on contact precautions. RN asked about droplet and MD stated the virus is not transmitted via droplet.

## 2020-10-10 ENCOUNTER — Inpatient Hospital Stay (HOSPITAL_COMMUNITY): Payer: BC Managed Care – PPO

## 2020-10-10 ENCOUNTER — Inpatient Hospital Stay: Payer: Self-pay

## 2020-10-10 DIAGNOSIS — I4891 Unspecified atrial fibrillation: Secondary | ICD-10-CM

## 2020-10-10 DIAGNOSIS — R600 Localized edema: Secondary | ICD-10-CM

## 2020-10-10 DIAGNOSIS — J189 Pneumonia, unspecified organism: Secondary | ICD-10-CM

## 2020-10-10 LAB — LEGIONELLA PNEUMOPHILA SEROGP 1 UR AG: L. pneumophila Serogp 1 Ur Ag: NEGATIVE

## 2020-10-10 LAB — RENAL FUNCTION PANEL
Albumin: 3.1 g/dL — ABNORMAL LOW (ref 3.5–5.0)
Anion gap: 9 (ref 5–15)
BUN: 15 mg/dL (ref 6–20)
CO2: 26 mmol/L (ref 22–32)
Calcium: 8.8 mg/dL — ABNORMAL LOW (ref 8.9–10.3)
Chloride: 96 mmol/L — ABNORMAL LOW (ref 98–111)
Creatinine, Ser: 1.3 mg/dL — ABNORMAL HIGH (ref 0.61–1.24)
GFR, Estimated: >60 mL/min (ref >60)
Glucose, Bld: 130 mg/dL — ABNORMAL HIGH (ref 70–99)
Phosphorus: 4.1 mg/dL (ref 2.5–4.6)
Potassium: 3.8 mmol/L (ref 3.5–5.1)
Sodium: 131 mmol/L — ABNORMAL LOW (ref 135–145)

## 2020-10-10 LAB — SODIUM, URINE, RANDOM: Sodium, Ur: 31 mmol/L

## 2020-10-10 LAB — COOXEMETRY PANEL
Carboxyhemoglobin: 1.1 % (ref 0.5–1.5)
Methemoglobin: 0.9 % (ref 0.0–1.5)
O2 Saturation: 56.5 %
Total hemoglobin: 14.4 g/dL (ref 12.0–16.0)

## 2020-10-10 LAB — CBC
HCT: 41.4 % (ref 39.0–52.0)
Hemoglobin: 13.4 g/dL (ref 13.0–17.0)
MCH: 30.4 pg (ref 26.0–34.0)
MCHC: 32.4 g/dL (ref 30.0–36.0)
MCV: 93.9 fL (ref 80.0–100.0)
Platelets: 253 10*3/uL (ref 150–400)
RBC: 4.41 MIL/uL (ref 4.22–5.81)
RDW: 13.7 % (ref 11.5–15.5)
WBC: 6.9 10*3/uL (ref 4.0–10.5)
nRBC: 0 % (ref 0.0–0.2)

## 2020-10-10 LAB — BASIC METABOLIC PANEL
Anion gap: 9 (ref 5–15)
BUN: 12 mg/dL (ref 6–20)
CO2: 31 mmol/L (ref 22–32)
Calcium: 9 mg/dL (ref 8.9–10.3)
Chloride: 93 mmol/L — ABNORMAL LOW (ref 98–111)
Creatinine, Ser: 1.19 mg/dL (ref 0.61–1.24)
GFR, Estimated: >60 mL/min (ref >60)
Glucose, Bld: 131 mg/dL — ABNORMAL HIGH (ref 70–99)
Potassium: 4 mmol/L (ref 3.5–5.1)
Sodium: 133 mmol/L — ABNORMAL LOW (ref 135–145)

## 2020-10-10 LAB — LACTIC ACID, PLASMA: Lactic Acid, Venous: 1.5 mmol/L (ref 0.5–1.9)

## 2020-10-10 LAB — BLOOD GAS, VENOUS
Acid-Base Excess: 2.3 mmol/L — ABNORMAL HIGH (ref 0.0–2.0)
Bicarbonate: 27.2 mmol/L (ref 20.0–28.0)
FIO2: 40
O2 Saturation: 96.9 %
pCO2, Ven: 49 mmHg (ref 44.0–60.0)
pH, Ven: 7.363 (ref 7.250–7.430)

## 2020-10-10 LAB — BRAIN NATRIURETIC PEPTIDE: B Natriuretic Peptide: 126.9 pg/mL — ABNORMAL HIGH (ref 0.0–100.0)

## 2020-10-10 LAB — PROTEIN / CREATININE RATIO, URINE
Creatinine, Urine: 62.43 mg/dL
Protein Creatinine Ratio: 0.32 mg/mg{Cre} — ABNORMAL HIGH (ref 0.00–0.15)
Total Protein, Urine: 20 mg/dL

## 2020-10-10 LAB — MAGNESIUM: Magnesium: 1.7 mg/dL (ref 1.7–2.4)

## 2020-10-10 LAB — HEPARIN LEVEL (UNFRACTIONATED): Heparin Unfractionated: 0.15 IU/mL — ABNORMAL LOW (ref 0.30–0.70)

## 2020-10-10 LAB — PROCALCITONIN: Procalcitonin: <0.1 ng/mL

## 2020-10-10 LAB — GLUCOSE, CAPILLARY: Glucose-Capillary: 176 mg/dL — ABNORMAL HIGH (ref 70–99)

## 2020-10-10 IMAGING — DX DG CHEST 1V PORT
1 series · 1 of 1 positions shown · non-contrast
Comparison: [DATE]

CLINICAL DATA: Central line placement

EXAM:
PORTABLE CHEST 1 VIEW

[chest ap]
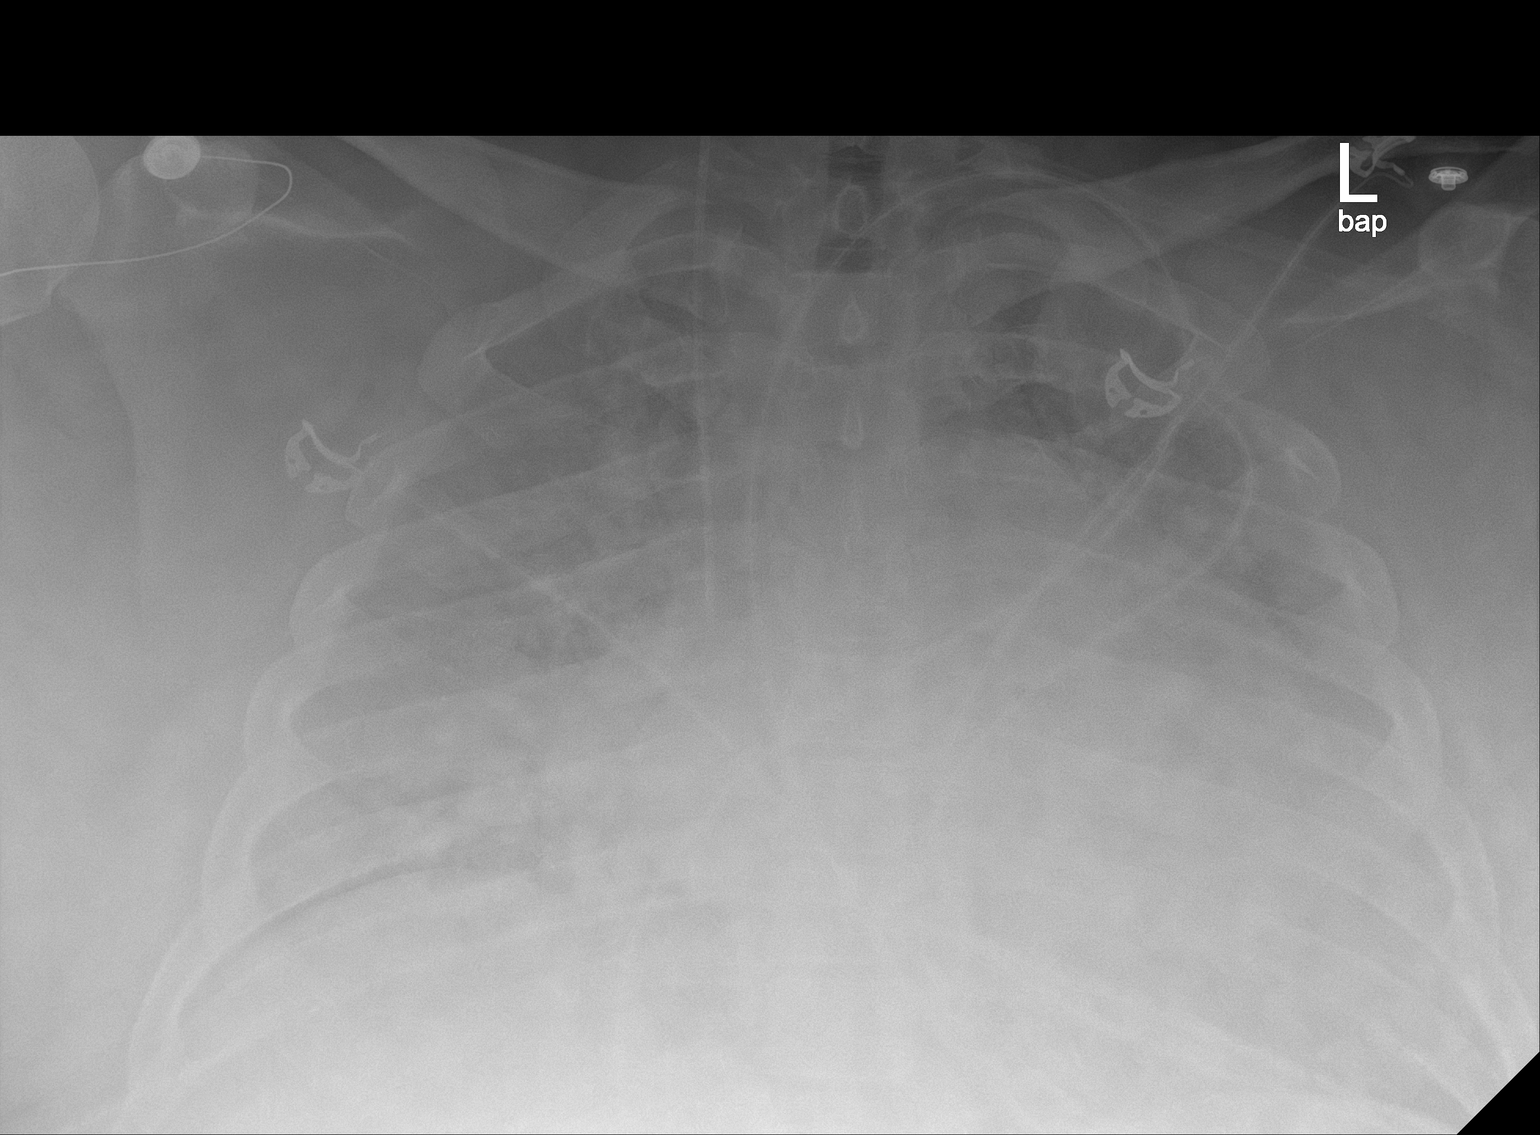

[1 of 1 positions shown; findings below may reference images not displayed]

FINDINGS: A right central line terminates in the central SVC. No pneumothorax.
Diffuse bilateral pulmonary infiltrates are markedly worsened in the
interval. Cardiomegaly. No other acute abnormalities.
IMPRESSION: 1. The right central line terminates in the central SVC without
pneumothorax.
2. Diffuse bilateral pulmonary infiltrates are worrisome for a
diffuse infectious process given CT pulmonary angiogram findings of
pneumonia from yesterday.

## 2020-10-10 MED ORDER — HEPARIN (PORCINE) 25000 UT/250ML-% IV SOLN
2600.0000 [IU]/h | INTRAVENOUS | Status: DC
  Administered 2020-10-10: 1800 [IU]/h via INTRAVENOUS
  Administered 2020-10-12: 2600 [IU]/h via INTRAVENOUS
  Filled 2020-10-10 (×5): qty 250

## 2020-10-10 MED ORDER — LIDOCAINE 5 % EX PTCH
1.0000 | MEDICATED_PATCH | CUTANEOUS | Status: DC
  Administered 2020-10-10 – 2020-10-13 (×2): 1 via TRANSDERMAL
  Filled 2020-10-10 (×2): qty 1

## 2020-10-10 MED ORDER — LIDOCAINE HCL (PF) 1 % IJ SOLN
5.0000 mL | Freq: Once | INTRAMUSCULAR | Status: DC
  Filled 2020-10-10: qty 5

## 2020-10-10 MED ORDER — IBUPROFEN 400 MG PO TABS
400.0000 mg | ORAL_TABLET | Freq: Four times a day (QID) | ORAL | Status: DC
  Administered 2020-10-10: 400 mg via ORAL
  Filled 2020-10-10: qty 1

## 2020-10-10 MED ORDER — TRAMADOL HCL 50 MG PO TABS
50.0000 mg | ORAL_TABLET | Freq: Once | ORAL | Status: AC
  Administered 2020-10-10: 50 mg via ORAL
  Filled 2020-10-10: qty 1

## 2020-10-10 MED ORDER — ACETAMINOPHEN 650 MG RE SUPP: 650.0000 mg | Freq: Four times a day (QID) | RECTAL | Status: DC

## 2020-10-10 MED ORDER — KETOROLAC TROMETHAMINE 15 MG/ML IJ SOLN: 7.5000 mg | Freq: Three times a day (TID) | INTRAMUSCULAR | Status: DC | PRN

## 2020-10-10 MED ORDER — GUAIFENESIN 100 MG/5ML PO SOLN
5.0000 mL | ORAL | Status: DC
  Administered 2020-10-10 – 2020-10-16 (×25): 100 mg via ORAL
  Filled 2020-10-10 (×13): qty 5
  Filled 2020-10-10: qty 10
  Filled 2020-10-10 (×7): qty 5
  Filled 2020-10-10: qty 10
  Filled 2020-10-10 (×3): qty 5

## 2020-10-10 MED ORDER — AMIODARONE HCL IN DEXTROSE 360-4.14 MG/200ML-% IV SOLN
60.0000 mg/h | INTRAVENOUS | Status: AC
  Administered 2020-10-10: 60 mg/h via INTRAVENOUS
  Filled 2020-10-10: qty 200

## 2020-10-10 MED ORDER — IPRATROPIUM-ALBUTEROL 0.5-2.5 (3) MG/3ML IN SOLN
3.0000 mL | RESPIRATORY_TRACT | Status: DC
  Administered 2020-10-10: 3 mL via RESPIRATORY_TRACT
  Filled 2020-10-10: qty 3

## 2020-10-10 MED ORDER — FENTANYL CITRATE (PF) 100 MCG/2ML IJ SOLN
INTRAMUSCULAR | Status: AC
  Filled 2020-10-10: qty 2

## 2020-10-10 MED ORDER — LEVALBUTEROL HCL 0.63 MG/3ML IN NEBU: 0.6300 mg | INHALATION_SOLUTION | Freq: Four times a day (QID) | RESPIRATORY_TRACT | Status: DC | PRN

## 2020-10-10 MED ORDER — AMIODARONE HCL IN DEXTROSE 360-4.14 MG/200ML-% IV SOLN
30.0000 mg/h | INTRAVENOUS | Status: DC
  Filled 2020-10-10 (×3): qty 200

## 2020-10-10 MED ORDER — AMIODARONE LOAD VIA INFUSION
150.0000 mg | Freq: Once | INTRAVENOUS | Status: AC
  Administered 2020-10-10: 150 mg via INTRAVENOUS
  Filled 2020-10-10: qty 83.34

## 2020-10-10 MED ORDER — ACETAMINOPHEN 325 MG PO TABS
650.0000 mg | ORAL_TABLET | Freq: Four times a day (QID) | ORAL | Status: DC
  Administered 2020-10-10 – 2020-10-16 (×19): 650 mg via ORAL
  Filled 2020-10-10 (×19): qty 2

## 2020-10-10 NOTE — Progress Notes (Signed)
[place] for IV heparin Indication: atrial fibrillation  Allergies  Allergen Reactions  . Coreg [Carvedilol] Shortness Of Breath and Diarrhea    Wheezing     Patient Measurements: Height: 6' (182.9 cm) Weight: (!) 212.4 kg (468 lb 2.9 oz) IBW/kg (Calculated) : 77.6 Heparin Dosing Weight: 131 kg  Vital Signs: Temp: 100.7 F (38.2 C) (03/18 2304) Temp Source: Oral (03/18 2304) BP: 118/83 (03/18 2304) Pulse Rate: 113 (03/18 2304)  Labs: Recent Labs    10/08/20 1031 10/08/20 1115 10/08/20 1251 10/09/20 0116 10/10/20 0217 10/10/20 1336 10/10/20 2304  HGB 14.0  --   --  13.3 13.4  --   --   HCT 41.8  --   --  41.1 41.4  --   --   PLT 265  --   --  252 253  --   --   HEPARINUNFRC  --   --   --   --   --   --  0.15*  CREATININE 1.13  --   --  1.12 1.19 1.30*  --   TROPONINIHS  --  25* 30*  --   --   --   --     Estimated Creatinine Clearance: 147.5 mL/min (A) (by C-G formula based on SCr of 1.3 mg/dL (H)).  Assessment: 35 yo male with new onset afib.  Had been receiving 100 mg of lovenox (0.5 mg/kg) for DVT prophylaxis.  Last dose today 945 AM. Pharmacy asked to begin anticoagulation with IV heparin.    Heparin level subtherapeutic (0.15) on gtt at 1800 units/hr. No issues with line or bleeding reported per RN.  Goal of Therapy:  Heparin level 0.3-0.7 units/ml Monitor platelets by anticoagulation protocol: Yes   Plan:  Increase IV heparin to 2200 units/hr  Will f/u 6 hr heparin level  Sherlon Handing, PharmD, BCPS Please see amion for complete clinical pharmacist phone list  10/10/2020 11:43 PM

## 2020-10-10 NOTE — Progress Notes (Signed)
Okay to proceed with the PICC placement per DR. B Coe in regards to elevated temp today. BC negative.

## 2020-10-10 NOTE — Progress Notes (Signed)
   10/10/20 0245  Vitals  Temp (!) 102.1 F (38.9 C)  Temp Source Oral  BP 108/64  MAP (mmHg) 78  BP Location Left Wrist  BP Method Automatic  Patient Position (if appropriate) Lying  Pulse Rate 94  Pulse Rate Source Monitor  ECG Heart Rate 93  Resp 20   650 tylenol given will continue to monitor

## 2020-10-10 NOTE — Progress Notes (Signed)
VBG revealed pH of 7.35.  Patient reevaluated.  Placed on CPAP and napping.  Oxygen saturation was in the 80s, O2 monitor was replaced.  Oxygen saturations above 90% at this time.  Patient continues to appear fatigued.  Will have low threshold to repeat VBG.  Cardiology on board, adding amiodarone and PICC line to be placed.  We will continue to monitor, patient is high risk for decompensation.

## 2020-10-10 NOTE — Progress Notes (Signed)
PT Cancellation Note  Patient Details Name: Phillip Young MRN: [id number] DOB: [birth year]   Cancelled Treatment:    Reason Eval/Treat Not Completed: Medical issues which prohibited therapy.  Pt is running a higher temp, will retry as time and pt allow.   Ramond Dial 10/10/2020, 10:25 AM Mee Hives, PT MS Acute Rehab Dept. Number: [place] [facility] [place]

## 2020-10-10 NOTE — CV Procedure (Signed)
Central Venous Catheter Insertion Procedure Note Earley Pock [id number] 13-Sep-1985    Procedure: Insertion of Central Venous Catheter Indications: Drug and/or fluid administration   Procedure Details Consent: Risks of procedure as well as the alternatives and risks of each were explained to the (patient/caregiver).  Consent for procedure obtained. Time Out: Verified patient identification, verified procedure, site/side was marked, verified correct patient position, special equipment/implants available, medications/allergies/relevent history reviewed, required imaging and test results available.  Performed   Maximum sterile technique was used including antiseptics, cap, gloves, gown, hand hygiene, mask and sheet. Skin prep: Chlorhexidine; local anesthetic administered A antimicrobial bonded/coated triple lumen catheter was placed in the right internal jugular vein using the Seldinger technique and u/s guidance.   Evaluation Blood flow good Complications: No apparent complications Patient did tolerate procedure well. Chest X-ray ordered to verify placement.  CXR: ok   Glori Bickers, MD  9:49 PM

## 2020-10-10 NOTE — Progress Notes (Signed)
Paged to bedside patient was in A. fib and heart rate in the 120s to 150s.  Went to bedside with co-resident Dr. Marianna Payment.  Patient resting in bed with significant other at bedside.  He states he feels fine except for pounding in his head.  States he was receiving a breathing treatment which helped improve his shortness of breath.  Denies chest pain.  Patient resting in bed saturating well on 5 L.  Pulse oximetry not reading at this time, but per nursing staff patient saturating 91% on 5 L.  Does not appear to be in acute respiratory distress at this time.  Overall patient does not appear in any acute distress as compared to earlier when he was evaluated.  EKG performed, difficult to assess if patient is in A. fib versus flutter.  Does appear that P waves are present making me think more of a flutter picture.  His heart rate was ranging from 90s to 120s.   Patient is already on digoxin.  Has been unable to tolerate carvedilol in the past (allergy of shortness of breath and wheezing) because of patient's acute illness of viral pneumonia, new oxygen requirement, and now with the A. Fib, will consult cardiology.  Patient is high risk for decompensation.  Asked nursing staff to place patient back on pulse ox.

## 2020-10-10 NOTE — Progress Notes (Signed)
   10/10/20 0535  Vitals  Temp (!) 102.8 F (39.3 C)  Temp Source Oral  BP 128/79  MAP (mmHg) 91  BP Location Left Wrist  BP Method Automatic  Patient Position (if appropriate) Lying  Pulse Rate 95  Pulse Rate Source Monitor  ECG Heart Rate 96  Resp 20   Pt tempeture went up to 102.8 after administration of tylenol check with 2 different thermometers, Dr Lisabeth Devoid paged and no new orders given will continue to monitor

## 2020-10-10 NOTE — Progress Notes (Signed)
ANTICOAGULATION CONSULT NOTE - Initial Consult  Pharmacy Consult for IV heparin Indication: atrial fibrillation  Allergies  Allergen Reactions  . Coreg [Carvedilol] Shortness Of Breath and Diarrhea    Wheezing     Patient Measurements: Height: 6' (182.9 cm) Weight: (!) 212.4 kg (468 lb 2.9 oz) IBW/kg (Calculated) : 77.6 Heparin Dosing Weight: 131 kg  Vital Signs: Temp: 99.1 F (37.3 C) (03/18 1229) Temp Source: Oral (03/18 1229) BP: 124/88 (03/18 1229) Pulse Rate: 90 (03/18 1229)  Labs: Recent Labs    10/08/20 1031 10/08/20 1115 10/08/20 1251 10/09/20 0116 10/10/20 0217  HGB 14.0  --   --  13.3 13.4  HCT 41.8  --   --  41.1 41.4  PLT 265  --   --  252 253  CREATININE 1.13  --   --  1.12 1.19  TROPONINIHS  --  25* 30*  --   --     Estimated Creatinine Clearance: 161.2 mL/min (by C-G formula based on SCr of 1.19 mg/dL).   Medical History: Past Medical History:  Diagnosis Date  . Biventricular congestive heart failure ([place])    Last Echo 11/2019 at [facility] reveals EF 20%  . Class 3 severe obesity due to excess calories with serious comorbidity and body mass index (BMI) of 50.0 to 59.9 in adult ([place]) 02/26/2020  . Essential hypertension 02/26/2020  . GERD without esophagitis 02/26/2020  . Hidradenitis suppurativa 02/26/2020  . OSA (obstructive sleep apnea) 02/26/2020  . Prediabetes 02/26/2020    Medications:  Scheduled:  . acetaminophen  650 mg Oral Q6H   Or  . acetaminophen  650 mg Rectal Q6H  . amiodarone  150 mg Intravenous Once  . dapagliflozin propanediol  10 mg Oral Daily  . digoxin  0.125 mg Oral Daily  . guaiFENesin  5 mL Oral Q4H  . influenza vac split quadrivalent PF  0.5 mL Intramuscular Tomorrow-1000  . insulin aspart  0-15 Units Subcutaneous TID WC  . lidocaine  1 patch Transdermal Q24H  . sacubitril-valsartan  1 tablet Oral BID  . spironolactone  25 mg Oral Daily  . torsemide  60 mg Oral Daily    Assessment: 35 yo male with new onset afib.  Had  been receiving 100 mg of lovenox (0.5 mg/kg) for DVT prophylaxis.  Last dose today 945 AM.  Pharmacy asked to begin anticoagulation with IV heparin.  CBC WNL, no known bleeding or complications noted.  Goal of Therapy:  Heparin level 0.3-0.7 units/ml Monitor platelets by anticoagulation protocol: Yes   Plan:  Start IV heparin at 1800 units/hr without bolus given recent Lovenox. Check heparin level 8 hrs after drip started. Daily heparin level and CBC.  [place] Crane, Roylene Reason, BCCP Clinical Pharmacist  10/10/2020 3:02 PM   [facility] pharmacy phone numbers are listed on [email or web address]

## 2020-10-10 NOTE — Progress Notes (Signed)
PICC unsuccessful.  Amiodarone and heparin not IV compatible. Primary team made aware and orders placed for central line.  Page HF MD on call as well at [place] who saw Pt earlier.  No call back.  Per primary heparin gtt remains running in single PIV.  Await central line for Amiodarone admin and CVP monitoring.   Pt resting in bed with CPAP on, SPO2 87-95%.  HR 110's to 130's, atrial fib.  BP stable. Will report to night RN

## 2020-10-10 NOTE — Progress Notes (Signed)
HD#2 Subjective:  Overnight Events: Back pain, given Toradol.  Patient febrile  Patient leaning over bedside table ill-appearing.  He states he feels worse today in terms of being febrile with body aches.  Patient states that he takes '60mg'$  torsemide daily at home although recently took torsemide '60mg'$  twice daily for 3 days. He says in the past he got more fluid off with an IV drip. He states that he continues to have severe shortness of breath and endorses fever and productive cough, intermittently able to cough up phlegm. He says he was unable to wear his CPAP overnight as there's a hole in his home tubing and he has not been oxygenating well. He says he would like his Robitussin and Motrin scheduled because he is having pain in his neck and back from coughing.  No other concerns at this time   Objective:  Vital signs in last 24 hours: Vitals:   10/09/20 2258 10/10/20 0245 10/10/20 0300 10/10/20 0535  BP: 112/77 108/64  128/79  Pulse: 69 94  95  Resp: '20 20  20  '$ Temp: 99.2 F (37.3 C) (!) 102.1 F (38.9 C)  (!) 102.8 F (39.3 C)  TempSrc: Oral Oral  Oral  SpO2: 93% 93%  96%  Weight:   (!) 212.4 kg   Height:       Supplemental O2: Nasal Cannula SpO2: 96 % O2 Flow Rate (L/min): 4 L/min   Physical Exam:  Constitutional: sitting up, edge of bed. Ill appearing HENT: normocephalic atraumatic Eyes: conjunctiva non-erythematous Neck: supple Cardiovascular: regular rate and rhythm, no m/r/g Pulmonary/Chest: Breathing comfortably on 4 L, diminished breath sounds throughout diffusely. Abdominal: soft, non-tender, non-distended MSK: normal bulk and tone Neurological: alert & oriented x 3 Skin: warm and dry.  Trace to 1+ bilateral lower extremity pitting edema worse on the left. Psych: Normal mood  Filed Weights   10/08/20 1120 10/10/20 0300  Weight: (!) 211.4 kg (!) 212.4 kg     Intake/Output Summary (Last 24 hours) at 10/10/2020 0613 Last data filed at 10/10/2020 0245 Gross  per 24 hour  Intake 1200 ml  Output 3800 ml  Net -2600 ml   Net IO Since Admission: -3,100 mL [10/10/20 0613]  Pertinent Labs: CBC Latest Ref Rng & Units 10/10/2020 10/09/2020 10/08/2020  WBC 4.0 - 10.5 K/uL 6.9 6.1 7.2  Hemoglobin 13.0 - 17.0 g/dL 13.4 13.3 14.0  Hematocrit 39.0 - 52.0 % 41.4 41.1 41.8  Platelets 150 - 400 K/uL 253 252 265    CMP Latest Ref Rng & Units 10/10/2020 10/09/2020 10/08/2020  Glucose 70 - 99 mg/dL 131(H) 118(H) 137(H)  BUN 6 - 20 mg/dL '12 9 7  '$ Creatinine 0.61 - 1.24 mg/dL 1.19 1.12 1.13  Sodium 135 - 145 mmol/L 133(L) 131(L) 132(L)  Potassium 3.5 - 5.1 mmol/L 4.0 3.6 3.4(L)  Chloride 98 - 111 mmol/L 93(L) 96(L) 96(L)  CO2 22 - 32 mmol/L '31 27 25  '$ Calcium 8.9 - 10.3 mg/dL 9.0 8.7(L) 8.9  Total Protein 6.5 - 8.1 g/dL - - 8.1  Total Bilirubin 0.3 - 1.2 mg/dL - - 1.3(H)  Alkaline Phos 38 - 126 U/L - - 70  AST 15 - 41 U/L - - 44(H)  ALT 0 - 44 U/L - - 36    Imaging: CT ANGIO CHEST PE W OR WO CONTRAST  Result Date: 10/09/2020 CLINICAL DATA:  Positive D-dimer.  Question pulmonary emboli. EXAM: CT ANGIOGRAPHY CHEST WITH CONTRAST TECHNIQUE: Multidetector CT imaging of the chest was  performed using the standard protocol during bolus administration of intravenous contrast. Multiplanar CT image reconstructions and MIPs were obtained to evaluate the vascular anatomy. CONTRAST:  169m OMNIPAQUE IOHEXOL 350 MG/ML SOLN COMPARISON:  Chest radiography yesterday.  Previous CT 02/19/2020 FINDINGS: Cardiovascular: Cardiomegaly. Small amount of pericardial fluid. No visible coronary artery calcification or aortic atherosclerotic calcification. Pulmonary arterial opacification is moderate. No central pulmonary emboli are seen. Small peripheral emboli could not be excluded given this degree of arterial opacification. Mediastinum/Nodes: No mediastinal or hilar mass. Mild nodal prominence, likely reactive. Lungs/Pleura: Bilateral lower lobe bronchopneumonia or extensive on the right  than the left. No pleural effusion. Upper Abdomen: Negative Musculoskeletal: Negative Review of the MIP images confirms the above findings. IMPRESSION: 1. Pulmonary arterial opacification is moderate. No central pulmonary emboli are seen. Small peripheral emboli could not be excluded given this degree of arterial opacification. 2. Cardiomegaly. Small amount of pericardial fluid. 3. Bilateral lower lobe bronchopneumonia more extensive on the right than the left. Electronically Signed   By: MNelson ChimesM.D.   On: 10/09/2020 14:04    Assessment/Plan:   Active Problems:   CAP (community acquired pneumonia)   Patient Summary: WHubbert Degrawis a 35y.o. with pertinent PMH of biventricular heart failure, diabetes, HLD, OSA, recent Covid infection who presented with dry cough, dyspnea on exertion and will admit for pneumonia versus acute CHF exacerbation.  Found to have viral pneumonia.  Pneumometavirus Lower respiratory infection Positive for PMV on respiratory panel.  Patient states he feels more ill today due to feeling febrile.  We will continue with conservative treatment, patient with bilateral pneumonia on CTA.  Continues to endorse body aches. -Continue Tylenol for any fever, alternate with ibuprofen for any pain. -Robitussin for cough scheduled -Flutter valve -Lidocaine patch for back pain -Wean oxygen as tolerated -Contact precautions  Biventricular heart failure (EF 20-25%, 05/2020) We will continue on home medications as per below.  Lower extremity edema is trace to 1+.  We will continue to monitor daily. -Continue EDelene Loll Farxiga, spironolactone, torsemide -I/O - -3100 -Daily Weight - 212 Kg - 211.4 Kg -Strict I/O's, daily weights -Daily BMPs and electrolytes, is hyponatremic suspect this is secondary to his diuretic use.  OSA Patient unable to use CPAP last night as home CPAP broken.  Will consult respiratory care for hospital supplied machine.  Expect if patient is able to  get a good nights rest, this will help him feel better. -CPAP nightly  Diet: Heart Healthy IVF: None,None VTE: Enoxaparin Code: Full PT/OT recs: None,  Dispo: Anticipated discharge to Home in 3 days pending improvement of hypoxia  VRutledgeInternal Medicine Resident PGY-1 Pager [phone] contact the on call pager after 5 pm and on weekends at [phone]

## 2020-10-10 NOTE — Progress Notes (Signed)
Went to patient room to place PICC line. Attempted twice in the LUE, able to access the vessel but unable to thread the catheter in the subclavian. Unable to thread the catheter centrally.  Assessed the vein in the RUE. Cephalic and Basilic vein is small to measure. Brachial vein is 4cm deep that is hard to access. No suitable vein for PICC insertion. RN made aware and to notify MD.

## 2020-10-10 NOTE — Consult Note (Addendum)
Advanced Heart Failure Team Consult Note   Primary Physician: Patient, No Pcp Per PCP-Cardiologist:  Dr. [name]    Reason for Consultation: acute on chronic systolic heart failure   HPI:    Phillip Young is seen today for evaluation of acute on chronic systolic heart failure + Atrial fibrillation w/ RVR at the request of Dr. Dareen Piano, Internal Medicine.   35 y.o.with history of cardiomyopathy and super morbid obesity. He has been known to have a cardiomyopathy (biventricular failure) since back in 2019, echo in 2019 with EF 20% [facility]). He has been followed in the [facility] system.  Admitted to [facility] in [id number] with A/C Systolic HF. Diuresed with IV lasix and transitioned to torsemide. Placed on milrinone with low output and had RHC/LHC. Cath showed no CAD and volume overload.  Blood gas revealed a CO2 level elevated to 78. CPAP was initiated.   He was unable to tolerate Coreg due to wheezing and diarrhea.   06/17/2020 Echo EF 20-25%, RV poorly visualized.   07/2020 he was diagnosed w COVID. Since COVID, he has struggled w/ persistent dyspnea and increased difficulty tolerating BiPAP at night.   He presented to [facility] ED on 3/16 w/ complains of nonproductive cough, increasing dyspnea and low grade fevers. COVID negative. BNP 93. CXR showed ?RLL opacity concerning for PNA + cardiomegaly and pulmonary vascular congestion. WBC 7. Started on abx but PCT not elevated, <0.10 x 3. Abx discontinued. Remains Febrile w/ temp 102.8. Had chest CT given sinus tach, dyspnea and elevated D-dimer but was negative for PE. Did show bilateral lower lobe bronchopneumonia worse on the right.  Being treated for viral PNA. Respiratory panel + for Metapneumovirus.   He was in sinus tach on EKG on admit but went into Afib w/ RVR earlier today during breathing treatment w/ albuterol.  Rates in the 150s. Given his chronic biventricular dysfunction and tachy atrial arrhthymia, AHF team has been consulted  to assist w/ further management given high risk of acute decompensation.   Echo 02/2020 EF <20%, RV severely reduced Echo 05/2020 EF 20-25%, G2DD, RV not well visualized. LA mildly dilated   [facility] 02/2020 1. No significant CAD, nonischemic cardiomyopathy.  2. Filling pressures remain elevated.  3. Severe pulmonary venous hypertension.  4. Low but not markedly low cardiac output.   Right Heart Pressures RHC Procedural Findings: Hemodynamics (mmHg) RA mean 19 RV 67/23 PA 71/20, mean 39 PCWP mean 23 LV 97/29 AO 95/60  Oxygen saturations: PA 61% AO 95%  Cardiac Output (Fick) 6.34  Cardiac Index (Fick) 2.1 PVR 2.5 WU      Review of Systems: [y] = yes, '[ ]'$  = no    General: Weight gain '[ ]'$ ; Weight loss '[ ]'$ ; Anorexia '[ ]'$ ; Fatigue [ Y]; Fever [ Y]; Chills '[ ]'$ ; Weakness '[ ]'$    Cardiac: Chest pain/pressure '[ ]'$ ; Resting SOB [Y ]; Exertional SOB [ Y]; Orthopnea '[ ]'$ ; Pedal Edema '[ ]'$ ; Palpitations '[ ]'$ ; Syncope '[ ]'$ ; Presyncope '[ ]'$ ; Paroxysmal nocturnal dyspnea'[ ]'$    Pulmonary: Cough [Y ]; Wheezing[ Y]; Hemoptysis'[ ]'$ ; Sputum '[ ]'$ ; Snoring '[ ]'$    GI: Vomiting'[ ]'$ ; Dysphagia'[ ]'$ ; Melena'[ ]'$ ; Hematochezia '[ ]'$ ; Heartburn'[ ]'$ ; Abdominal pain '[ ]'$ ; Constipation '[ ]'$ ; Diarrhea '[ ]'$ ; BRBPR '[ ]'$    GU: Hematuria'[ ]'$ ; Dysuria '[ ]'$ ; Nocturia'[ ]'$    Vascular: Pain in legs with walking '[ ]'$ ; Pain in feet with lying flat '[ ]'$ ; Non-healing sores '[ ]'$ ; Stroke '[ ]'$ ;  TIA '[ ]'$ ; Slurred speech '[ ]'$ ;   Neuro: Headaches'[ ]'$ ; Vertigo'[ ]'$ ; Seizures'[ ]'$ ; Paresthesias'[ ]'$ ;Blurred vision '[ ]'$ ; Diplopia '[ ]'$ ; Vision changes '[ ]'$    Ortho/Skin: Arthritis '[ ]'$ ; Joint pain '[ ]'$ ; Muscle pain '[ ]'$ ; Joint swelling '[ ]'$ ; Back Pain '[ ]'$ ; Rash '[ ]'$    Psych: Depression'[ ]'$ ; Anxiety'[ ]'$    Heme: Bleeding problems '[ ]'$ ; Clotting disorders '[ ]'$ ; Anemia '[ ]'$    Endocrine: Diabetes '[ ]'$ ; Thyroid dysfunction'[ ]'$   Home Medications Prior to Admission medications   Medication Sig Start Date End Date Taking? Authorizing Provider  CORLANOR 5 MG TABS tablet Take 7.5 mg  by mouth 2 (two) times daily. 09/10/20  Yes [provider]  dapagliflozin propanediol (FARXIGA) 10 MG TABS tablet Take 1 tablet (10 mg total) by mouth daily. 04/16/20  Yes Labib Cwynar, Shaune Pascal, MD  digoxin (LANOXIN) 0.125 MG tablet Take 1 tablet (0.125 mg total) by mouth daily. 04/16/20  Yes Christine Schiefelbein, Shaune Pascal, MD  Potassium Chloride ER 20 MEQ TBCR Take 20 mEq by mouth 2 (two) times daily. 04/16/20 06/17/20 Yes Ferol Laiche, Shaune Pascal, MD  sacubitril-valsartan (ENTRESTO) 49-51 MG Take 1 tablet by mouth 2 (two) times daily.   Yes [provider]  torsemide (DEMADEX) 20 MG tablet Take 60 mg by mouth daily.   Yes [provider]  sacubitril-valsartan (ENTRESTO) 97-103 MG Take 1 tablet by mouth 2 (two) times daily. Patient not taking: No sig reported 10/02/20   Rafael Bihari, FNP  spironolactone (ALDACTONE) 25 MG tablet TAKE 1 TABLET(25 MG) BY MOUTH DAILY 10/09/20   Vrinda Heckstall, Shaune Pascal, MD    Past Medical History: Past Medical History:  Diagnosis Date   Biventricular congestive heart failure ([place])    Last Echo 11/2019 at [facility] reveals EF 20%   Class 3 severe obesity due to excess calories with serious comorbidity and body mass index (BMI) of 50.0 to 59.9 in adult [facility]) 02/26/2020   Essential hypertension 02/26/2020   GERD without esophagitis 02/26/2020   Hidradenitis suppurativa 02/26/2020   OSA (obstructive sleep apnea) 02/26/2020   Prediabetes 02/26/2020    Past Surgical History: Past Surgical History:  Procedure Laterality Date   ABSCESS DRAINAGE     RIGHT/LEFT HEART CATH AND CORONARY ANGIOGRAPHY N/A 03/04/2020   Procedure: RIGHT/LEFT HEART CATH AND CORONARY ANGIOGRAPHY;  Surgeon: Larey Dresser, MD;  Location: [facility];  Service: Cardiovascular;  Laterality: N/A;    Family History: Family History  Problem Relation Age of Onset   Heart disease Mother    Hypertension Mother    Pulmonary Hypertension Mother    Drug abuse Father        died  due to Heroin overdose    Social History: Social History   Socioeconomic History   Marital status: Unknown    Spouse name: Not on file   Number of children: Not on file   Years of education: Not on file   Highest education level: Not on file  Occupational History   Not on file  Tobacco Use   Smoking status: Former Smoker   Smokeless tobacco: Former Systems developer   Tobacco comment: quit in 2019  Substance and Sexual Activity   Alcohol use: Never   Drug use: Never   Sexual activity: Not on file  Other Topics Concern   Not on file  Social History Narrative   Not on file   Social Determinants of Health   Financial Resource Strain: Not on file  Food Insecurity:  Not on file  Transportation Needs: Not on file  Physical Activity: Not on file  Stress: Not on file  Social Connections: Not on file    Allergies:  Allergies  Allergen Reactions   Coreg [Carvedilol] Shortness Of Breath and Diarrhea    Wheezing     Objective:    Vital Signs:   Temp:  [99.1 F (37.3 C)-102.8 F (39.3 C)] 99.1 F (37.3 C) (03/18 1229) Pulse Rate:  [69-98] 90 (03/18 1229) Resp:  [20-33] 33 (03/18 1127) BP: (104-139)/(43-88) 124/88 (03/18 1229) SpO2:  [90 %-96 %] 90 % (03/18 1229) Weight:  [212.4 kg] 212.4 kg (03/18 0300) Last BM Date: 10/09/20  Weight change: Filed Weights   10/08/20 1120 10/10/20 0300  Weight: (!) 211.4 kg (!) 212.4 kg    Intake/Output:   Intake/Output Summary (Last 24 hours) at 10/10/2020 1249 Last data filed at 10/10/2020 0803 Gross per 24 hour  Intake 2160 ml  Output 4400 ml  Net -2240 ml      Physical Exam    General:  Well appearing but super morbidly obese AAM. No resp difficulty HEENT: normal Neck: supple. Thick neck JVD assessment difficult . Carotids 2+ bilat; no bruits. No lymphadenopathy or thyromegaly appreciated. Cor: PMI nondisplaced. Irregularly irregular rhythm, tachy rate No rubs, gallops or murmurs. Lungs: Decreased BS at the bases,  + expiratory wheezing RLL Abdomen: obese soft, nontender, nondistended. No hepatosplenomegaly. No bruits or masses. Good bowel sounds. Extremities: no cyanosis, clubbing, rash, obese extremities  Neuro: alert & orientedx3, cranial nerves grossly intact. moves all 4 extremities w/o difficulty. Affect pleasant   Telemetry   Atrial fibrillation 120s   EKG    Admit EKG sinus tach 100 bpm   Labs   Basic Metabolic Panel: Recent Labs  Lab 10/08/20 1031 10/09/20 0116 10/10/20 0217  NA 132* 131* 133*  K 3.4* 3.6 4.0  CL 96* 96* 93*  CO2 '25 27 31  '$ GLUCOSE 137* 118* 131*  BUN '7 9 12  '$ CREATININE 1.13 1.12 1.19  CALCIUM 8.9 8.7* 9.0    Liver Function Tests: Recent Labs  Lab 10/08/20 1115  AST 44*  ALT 36  ALKPHOS 70  BILITOT 1.3*  PROT 8.1  ALBUMIN 3.3*   No results for input(s): LIPASE, AMYLASE in the last 168 hours. No results for input(s): AMMONIA in the last 168 hours.  CBC: Recent Labs  Lab 10/08/20 1031 10/09/20 0116 10/10/20 0217  WBC 7.2 6.1 6.9  HGB 14.0 13.3 13.4  HCT 41.8 41.1 41.4  MCV 92.7 93.6 93.9  PLT 265 252 253    Cardiac Enzymes: No results for input(s): CKTOTAL, CKMB, CKMBINDEX, TROPONINI in the last 168 hours.  BNP: BNP (last 3 results) Recent Labs    07/29/20 0140 10/02/20 1015 10/08/20 1115  BNP 27.9 153.4* 93.2    ProBNP (last 3 results) No results for input(s): PROBNP in the last 8760 hours.   CBG: Recent Labs  Lab 10/09/20 0630 10/09/20 1215 10/09/20 1727 10/09/20 2127 10/10/20 0611  GLUCAP 176* 132* 107* 124* 176*    Coagulation Studies: No results for input(s): LABPROT, INR in the last 72 hours.   Imaging   CT ANGIO CHEST PE W OR WO CONTRAST  Result Date: 10/09/2020 CLINICAL DATA:  Positive D-dimer.  Question pulmonary emboli. EXAM: CT ANGIOGRAPHY CHEST WITH CONTRAST TECHNIQUE: Multidetector CT imaging of the chest was performed using the standard protocol during bolus administration of intravenous contrast.  Multiplanar CT image reconstructions and MIPs were obtained to evaluate  the vascular anatomy. CONTRAST:  16m OMNIPAQUE IOHEXOL 350 MG/ML SOLN COMPARISON:  Chest radiography yesterday.  Previous CT 02/19/2020 FINDINGS: Cardiovascular: Cardiomegaly. Small amount of pericardial fluid. No visible coronary artery calcification or aortic atherosclerotic calcification. Pulmonary arterial opacification is moderate. No central pulmonary emboli are seen. Small peripheral emboli could not be excluded given this degree of arterial opacification. Mediastinum/Nodes: No mediastinal or hilar mass. Mild nodal prominence, likely reactive. Lungs/Pleura: Bilateral lower lobe bronchopneumonia or extensive on the right than the left. No pleural effusion. Upper Abdomen: Negative Musculoskeletal: Negative Review of the MIP images confirms the above findings. IMPRESSION: 1. Pulmonary arterial opacification is moderate. No central pulmonary emboli are seen. Small peripheral emboli could not be excluded given this degree of arterial opacification. 2. Cardiomegaly. Small amount of pericardial fluid. 3. Bilateral lower lobe bronchopneumonia more extensive on the right than the left. Electronically Signed   By: MNelson ChimesM.D.   On: 10/09/2020 14:04      Medications:     Current Medications:  acetaminophen  650 mg Oral Q6H   Or   acetaminophen  650 mg Rectal Q6H   dapagliflozin propanediol  10 mg Oral Daily   digoxin  0.125 mg Oral Daily   enoxaparin (LOVENOX) injection  100 mg Subcutaneous Daily   guaiFENesin  5 mL Oral Q4H   ibuprofen  400 mg Oral Q6H   influenza vac split quadrivalent PF  0.5 mL Intramuscular Tomorrow-1000   insulin aspart  0-15 Units Subcutaneous TID WC   ivabradine  7.5 mg Oral BID   lidocaine  1 patch Transdermal Q24H   sacubitril-valsartan  1 tablet Oral BID   spironolactone  25 mg Oral Daily   torsemide  60 mg Oral Daily     Infusions:     Assessment/Plan   1. Viral  PNA - CXR and Chest CT w/ bilateral lower lobe bronchopneumonia worse on the right - COVID negative  - Febrile mTemp 102. WBC 7. PCT <0.10 x 3 - Respiratory panel + for Metapneumovirus  - supportive care - change PRN bronchodilator from Albuterol to Xopenex to reduce risk of reflex tachycardia   2. Atrial Fibrillation w/ RVR - new diagnosis - suspect triggered by acute viral infection (PNA)  - also in setting of structural heart diease and underlying OSA w/ poor compliance w/ CPAP - K normal, 4.0. Check Mg level and TSH  - no Cardizem w/ BiV dysfunction, avoid  blocker w/ acute CHF decompensation - start Amio gtt for rate/rhythm control  - Keep K > 4.0 and Mg > 2.0  - continue digoxin 0.125 - ? Discontinuation of ivabradine    - start heparin gtt for now    3. Acute on Chronic Biventricular Heart Failure  - Known cardiomyopathy since 2019. Echo 02/27/2020 his admission with EF <20%, moderate LV dilation, severely decreased RV function with severe RV dilation, severe biatrial enlargement, mild MR. No history of ETOH/drugs. No FH of cardiomyopathy (mother with [place]. 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 Suspect he is too large for cardiac MRI. Narrow QRS on ECG so not CRT candidate. RHC/LHC on 03/04/20 with no CAD (nonischemic cardiomyopathy), elevated left and right heart filling pressures, and low CI at 2.1. 05/2020 Echo showed EF 20-25%. RV was not well visualized. Saw EP but needs to lose weight for ICD.  - Initial CXR w/ cardiomegaly and pulmonary vascular congestion - BNP only 92 but may be falsely low in setting of obesity  -  Initially received IV Lasix. Net I/Os negative 4.5L. Now back on PO toresemide - suspect he is still volume overloaded but assessing degree is challenging on exam due to body habitus/obesity  - Place PICC for CVP monitoring to help guide further diuresis  - follow co-ox after PICC placement  - resume  IV Lasix if CVP elevated  - Continue Entresto 49-51 bid - Continue Spiro 25 mg daily  - Continue Farxiga 10 mg daily  - Continue digoxin 0.125 mg daily. Check dig level  - ? Discontinuing ivabradine now in Afib  - Strict I/Os and daily wts   4. OSA - CPAP QHS   5. Super Morbid Obesity  Body mass index is 63.5 kg/m. - wt loss advised   Length of Stay: 2  Lyda Jester, PA-C  10/10/2020, 12:49 PM  Advanced Heart Failure Team Pager [phone] (M-F; 7a - 5p)  Please contact [facility] for night-coverage after hours (4p -7a ) and weekends on [email or web address]  Patient seen and examined with the above-signed Advanced Practice Provider and/or Housestaff. I personally reviewed laboratory data, imaging studies and relevant notes. I independently examined the patient and formulated the important aspects of the plan. I have edited the note to reflect any of my changes or salient points. I have personally discussed the plan with the patient and/or family.  35 y/o male with super morbid obesity and severe systolic HF due to biventricular dysfunction. Admitted with recurrent HF in setting of PNA and AF with RVR.   Remains quite SO. Mentating ok. Febrile to 102  Volume status difficult to assess but suspect he is volume overloaded and possibly low output.   General:  Obese male mildly SOB HEENT: normal Neck: supple. Unable to assess JVP. Carotids 2+ bilat; no bruits. No lymphadenopathy or thryomegaly appreciated. Cor: PMI nondisplaced. Irregular tachy  Lungs: coarse Abdomen: obese soft, nontender, nondistended.  No bruits or masses. Good bowel sounds. Extremities: no cyanosis, clubbing, rash, 1-2+ woody edema Neuro: alert & orientedx3, cranial nerves grossly intact. moves all 4 extremities w/o difficulty. Affect pleasant  He remains tenuous. I placed central line this evening to get a better sense of his volume status and cardiac output. CVPs may be elevated due to chronic RV failure. Would  be careful not to overdiurese in setting of active infection. Agree with amio and heparin for AF. We will follow.   Glori Bickers, MD  9:53 PM

## 2020-10-10 NOTE — Progress Notes (Signed)
RT unable to obtain ABG. Pt did not want another RT to try. MD at bedside and will order. VBG.

## 2020-10-10 NOTE — Progress Notes (Signed)
Patient reevaluated at bedside. Patient states he is weak. Denies trouble breathing. Does appear more fatigued. Will order stat ABG. Consider placing patient on cpap

## 2020-10-10 NOTE — Significant Event (Signed)
Rapid Response Progress Note   Reason for Call :  Asked to evaluate pt.  Pt admitted for pneumonia vs acute CHF exacerbation. Hx of heart failure, OSA, recent Covid infection. Ordered to have a heparin gtt and amiodarone gtt. Amio gtt initiation delayed d/t needing additional IV access. IV team unable to place PICC line. Currently he has one PIV which is infusing Heparin gtt. Pt AO. Pt endorses pain "all over", congested cough, headache. Febrile. Warm, dry to touch. Lung sounds are diminished. No distress, currently on his CPAP. His heart rate is irregular, 100-135 bpm, atrial fibrillation.  T 101.5F, BP 99/28, HR 125, RR 24, SpO2 96% on 5LNC  Interventions:  No acute need for rapid response intervention at this time RN contacting primary MD regarding IV access needs.  Time arrived: 1800 Time departed: [place], RN

## 2020-10-10 NOTE — Progress Notes (Signed)
Peripherally Inserted Central Catheter Placement  The IV Nurse has discussed with the patient and/or persons authorized to consent for the patient, the purpose of this procedure and the potential benefits and risks involved with this procedure.  The benefits include less needle sticks, lab draws from the catheter, and the patient may be discharged home with the catheter. Risks include, but not limited to, infection, bleeding, blood clot (thrombus formation), and puncture of an artery; nerve damage and irregular heartbeat and possibility to perform a PICC exchange if needed/ordered by physician.  Alternatives to this procedure were also discussed.  Bard Power PICC patient education guide, fact sheet on infection prevention and patient information card has been provided to patient /or left at bedside.    PICC Placement Documentation        Phillip Young 10/10/2020, 5:42 PM

## 2020-10-11 LAB — BLOOD GAS, VENOUS
Acid-Base Excess: 4.5 mmol/L — ABNORMAL HIGH (ref 0.0–2.0)
Acid-Base Excess: 4.5 mmol/L — ABNORMAL HIGH (ref 0.0–2.0)
Bicarbonate: 30.3 mmol/L — ABNORMAL HIGH (ref 20.0–28.0)
Bicarbonate: 30.3 mmol/L — ABNORMAL HIGH (ref 20.0–28.0)
FIO2: 40
FIO2: 56
O2 Saturation: 61.3 %
O2 Saturation: 73.9 %
Patient temperature: 37
Patient temperature: 37
pCO2, Ven: 62.4 mmHg — ABNORMAL HIGH (ref 44.0–60.0)
pCO2, Ven: 61 mmHg — ABNORMAL HIGH (ref 44.0–60.0)
pH, Ven: 7.308 (ref 7.250–7.430)
pH, Ven: 7.316 (ref 7.250–7.430)
pO2, Ven: 36.9 mmHg (ref 32.0–45.0)

## 2020-10-11 LAB — COMPREHENSIVE METABOLIC PANEL
ALT: 50 U/L — ABNORMAL HIGH (ref 0–44)
AST: 58 U/L — ABNORMAL HIGH (ref 15–41)
Albumin: 3 g/dL — ABNORMAL LOW (ref 3.5–5.0)
Alkaline Phosphatase: 53 U/L (ref 38–126)
Anion gap: 8 (ref 5–15)
BUN: 15 mg/dL (ref 6–20)
CO2: 29 mmol/L (ref 22–32)
Calcium: 8.6 mg/dL — ABNORMAL LOW (ref 8.9–10.3)
Chloride: 94 mmol/L — ABNORMAL LOW (ref 98–111)
Creatinine, Ser: 1.31 mg/dL — ABNORMAL HIGH (ref 0.61–1.24)
GFR, Estimated: >60 mL/min (ref >60)
Glucose, Bld: 127 mg/dL — ABNORMAL HIGH (ref 70–99)
Potassium: 4 mmol/L (ref 3.5–5.1)
Sodium: 131 mmol/L — ABNORMAL LOW (ref 135–145)
Total Bilirubin: 1 mg/dL (ref 0.3–1.2)
Total Protein: 7.7 g/dL (ref 6.5–8.1)

## 2020-10-11 LAB — HEPARIN LEVEL (UNFRACTIONATED)
Heparin Unfractionated: 0.29 IU/mL — ABNORMAL LOW (ref 0.30–0.70)
Heparin Unfractionated: 0.25 IU/mL — ABNORMAL LOW (ref 0.30–0.70)
Heparin Unfractionated: 0.32 IU/mL (ref 0.30–0.70)
Heparin Unfractionated: 0.2 IU/mL — ABNORMAL LOW (ref 0.30–0.70)

## 2020-10-11 LAB — COOXEMETRY PANEL
Carboxyhemoglobin: 1 % (ref 0.5–1.5)
Methemoglobin: 0.7 % (ref 0.0–1.5)
O2 Saturation: 55.5 %
Total hemoglobin: 15.1 g/dL (ref 12.0–16.0)

## 2020-10-11 LAB — GLUCOSE, CAPILLARY
Glucose-Capillary: 151 mg/dL — ABNORMAL HIGH (ref 70–99)
Glucose-Capillary: 128 mg/dL — ABNORMAL HIGH (ref 70–99)
Glucose-Capillary: 149 mg/dL — ABNORMAL HIGH (ref 70–99)

## 2020-10-11 LAB — MAGNESIUM: Magnesium: 1.8 mg/dL (ref 1.7–2.4)

## 2020-10-11 LAB — PHOSPHORUS: Phosphorus: 4.1 mg/dL (ref 2.5–4.6)

## 2020-10-11 MED ORDER — BENZONATATE 100 MG PO CAPS
100.0000 mg | ORAL_CAPSULE | Freq: Three times a day (TID) | ORAL | Status: DC | PRN
  Administered 2020-10-11 – 2020-10-15 (×6): 100 mg via ORAL
  Filled 2020-10-11 (×6): qty 1

## 2020-10-11 MED ORDER — HEPARIN SODIUM (PORCINE) 5000 UNIT/ML IJ SOLN
INTRAMUSCULAR | Status: AC
  Administered 2020-10-11: 1500 [IU]
  Filled 2020-10-11: qty 1

## 2020-10-11 MED ORDER — HEPARIN BOLUS VIA INFUSION
1500.0000 [IU] | Freq: Once | INTRAVENOUS | Status: DC
  Filled 2020-10-11: qty 1500

## 2020-10-11 MED ORDER — HYDROCOD POLST-CPM POLST ER 10-8 MG/5ML PO SUER
5.0000 mL | Freq: Once | ORAL | Status: AC
  Administered 2020-10-11: 5 mL via ORAL
  Filled 2020-10-11: qty 5

## 2020-10-11 MED ORDER — CHLORHEXIDINE GLUCONATE CLOTH 2 % EX PADS
6.0000 | MEDICATED_PAD | Freq: Every day | CUTANEOUS | Status: DC
  Administered 2020-10-11: 6 via TOPICAL

## 2020-10-11 MED ORDER — MAGNESIUM SULFATE 2 GM/50ML IV SOLN
2.0000 g | Freq: Once | INTRAVENOUS | Status: AC
  Administered 2020-10-11: 2 g via INTRAVENOUS
  Filled 2020-10-11: qty 50

## 2020-10-11 NOTE — Progress Notes (Signed)
Pt noted sleeping in room, encouraged pt to put CPAP on while asleep pt stated "I don't feel like it, maybe tonight I will try again".

## 2020-10-11 NOTE — Progress Notes (Signed)
[place] for IV heparin Indication: atrial fibrillation  Allergies  Allergen Reactions  . Coreg [Carvedilol] Shortness Of Breath and Diarrhea    Wheezing     Patient Measurements: Height: 6' (182.9 cm) Weight: (!) 212.4 kg (468 lb 2.9 oz) IBW/kg (Calculated) : 77.6 Heparin Dosing Weight: 131 kg  Vital Signs: Temp: 100.1 F (37.8 C) (03/19 0809) Temp Source: Oral (03/19 0809) BP: 90/77 (03/19 0809) Pulse Rate: 125 (03/19 0809)  Labs: Recent Labs    10/08/20 1115 10/08/20 1251 10/09/20 0116 10/09/20 0116 10/10/20 0217 10/10/20 1336 10/10/20 2304 10/11/20 0500 10/11/20 0536  HGB  --   --  13.3  --  13.4  --   --   --   --   HCT  --   --  41.1  --  41.4  --   --   --   --   PLT  --   --  252  --  253  --   --   --   --   HEPARINUNFRC  --   --   --   --   --   --  0.15*  --  0.29*  CREATININE  --   --  1.12   < > 1.19 1.30*  --  1.31*  --   TROPONINIHS 25* 30*  --   --   --   --   --   --   --    < > = values in this interval not displayed.    Estimated Creatinine Clearance: 146.4 mL/min (A) (by C-G formula based on SCr of 1.31 mg/dL (H)).  Assessment: 35 yo male with new onset afib.  Had been receiving 100 mg of lovenox (0.5 mg/kg) for DVT prophylaxis.  Last dose today 945 AM. Pharmacy asked to begin anticoagulation with IV heparin.    AM heparin level delayed since it was hemolyzed - f/u level subtherapeutic (0.25) on gtt at 2,200 units/hr. No issues with line or bleeding noted.   Goal of Therapy:  Heparin level 0.3-0.7 units/ml Monitor platelets by anticoagulation protocol: Yes   Plan:  Increase IV heparin to 2,400 units/hr   F/u 6 hr heparin level Monitor daily HL, CBC and s/sx bleeding  Mercy Riding, PharmD PGY1 Acute Care Pharmacy Resident Please refer to [facility] for unit-specific pharmacist

## 2020-10-11 NOTE — Progress Notes (Signed)
After multiple attempts to get pt wear CPAP from myself and multiple MD's pt still doesn't want to wear it.

## 2020-10-11 NOTE — Progress Notes (Signed)
HD#3 SUBJECTIVE:  Overnight Events: Patient had central line placed overnight. Appreciate PCCM and Advanced Heart Failures assistance with CVC placement. Patient tolerated procedure well.  Patient was not able to tolerate CPAP overnight despite multiple attempts to educate him on his need for CPAP therapy while sleeping.  Patient stated that he was unable to keep it on due to having to get up frequently to urinate.   Patient was evaluated this morning while sitting at his bedside chair.  The nurse did page me right before evaluating patient due to concern over increased sleepiness.  On evaluation the patient does appear sleepy but is alert and oriented.  He is moderately tachypneic, but does not appear to be in overt respiratory distress.  He states that he is feeling a little bit better today but feels as though he is not sleeping well and is more sleepy due to this.  I educated him on the importance of using his CPAP whenever he is resting.   OBJECTIVE:  Vital Signs: Vitals:   10/10/20 2140 10/10/20 2300 10/10/20 2304 10/11/20 0034  BP: (!) 118/102 (!) 132/119 118/83   Pulse: 66  (!) 113 100  Resp: (!) '23  20 20  '$ Temp:   (!) 100.7 F (38.2 C) 100.2 F (37.9 C)  TempSrc:   Oral Oral  SpO2: 97%  93% 94%  Weight:      Height:       Supplemental O2: Nasal Cannula SpO2: 94 % O2 Flow Rate (L/min): 5 L/min  Filed Weights   10/08/20 1120 10/10/20 0300  Weight: (!) 211.4 kg (!) 212.4 kg     Intake/Output Summary (Last 24 hours) at 10/11/2020 0530 Last data filed at 10/11/2020 0200 Gross per 24 hour  Intake 1466.9 ml  Output 3600 ml  Net -2133.1 ml   Net IO Since Admission: -4,633.1 mL [10/11/20 0530]  Physical Exam: Physical Exam Constitutional:      General: He is not in acute distress.    Appearance: He is obese.  HENT:     Head: Normocephalic and atraumatic.  Eyes:     Extraocular Movements: Extraocular movements intact.  Cardiovascular:     Rate and Rhythm:  Tachycardia present. Rhythm irregular.     Pulses: Normal pulses.     Heart sounds: No murmur heard.   Pulmonary:     Effort: Pulmonary effort is normal. Tachypnea present. No respiratory distress.     Comments: Distant breath sounds without significant wheezing or rails improved from patient. Abdominal:     General: Bowel sounds are normal. There is no distension.     Tenderness: There is no abdominal tenderness.  Musculoskeletal:        General: Normal range of motion.     Cervical back: Normal range of motion.     Right lower leg: Edema (1-2+ improved from yesterday.) present.     Left lower leg: Edema (1-2+ improved) present.  Skin:    General: Skin is warm and dry.  Neurological:     General: No focal deficit present.     Mental Status: He is alert and oriented to person, place, and time.     Patient Lines/Drains/Airways Status    Active Line/Drains/Airways    Name Placement date Placement time Site Days   Peripheral IV 10/09/20 Right;Anterior Forearm 10/09/20  0523  Forearm  2   CVC Triple Lumen 10/10/20 Right Internal jugular 10/10/20  2135  - 1  Recent Labs    10/09/20 1727 10/09/20 2127 10/10/20 0611  GLUCAP 107* 124* 176*     Pertinent Labs: CBC Latest Ref Rng & Units 10/10/2020 10/09/2020 10/08/2020  WBC 4.0 - 10.5 K/uL 6.9 6.1 7.2  Hemoglobin 13.0 - 17.0 g/dL 13.4 13.3 14.0  Hematocrit 39.0 - 52.0 % 41.4 41.1 41.8  Platelets 150 - 400 K/uL 253 252 265    CMP Latest Ref Rng & Units 10/10/2020 10/10/2020 10/09/2020  Glucose 70 - 99 mg/dL 130(H) 131(H) 118(H)  BUN 6 - 20 mg/dL '15 12 9  '$ Creatinine 0.61 - 1.24 mg/dL 1.30(H) 1.19 1.12  Sodium 135 - 145 mmol/L 131(L) 133(L) 131(L)  Potassium 3.5 - 5.1 mmol/L 3.8 4.0 3.6  Chloride 98 - 111 mmol/L 96(L) 93(L) 96(L)  CO2 22 - 32 mmol/L '26 31 27  '$ Calcium 8.9 - 10.3 mg/dL 8.8(L) 9.0 8.7(L)  Total Protein 6.5 - 8.1 g/dL - - -  Total Bilirubin 0.3 - 1.2 mg/dL - - -  Alkaline Phos 38 - 126 U/L - - -  AST  15 - 41 U/L - - -  ALT 0 - 44 U/L - - -   CVP: 13  Co-ox: 56.5 FENa: 0.5%  Imaging: DG CHEST PORT 1 VIEW  Result Date: 10/10/2020 CLINICAL DATA:  Central line placement EXAM: PORTABLE CHEST 1 VIEW COMPARISON:  October 08, 2020 FINDINGS: A right central line terminates in the central SVC. No pneumothorax. Diffuse bilateral pulmonary infiltrates are markedly worsened in the interval. Cardiomegaly. No other acute abnormalities. IMPRESSION: 1. The right central line terminates in the central SVC without pneumothorax. 2. Diffuse bilateral pulmonary infiltrates are worrisome for a diffuse infectious process given CT pulmonary angiogram findings of pneumonia from yesterday. Electronically Signed   By: Dorise Bullion III M.D   On: 10/10/2020 21:56   [place] EKG SITE RITE  Result Date: 10/10/2020 If Site Rite image not attached, placement could not be confirmed due to current cardiac rhythm.   ASSESSMENT/PLAN:   Active Problems:   CAP (community acquired pneumonia)   Patient Summary: Phillip Young a 35 y.o.with pertinent PMH of biventricular heart failure, diabetes, HLD, OSA, recent Covid infectionwho presented withdry cough, dyspnea on exertionand willadmit forPMV pneumonia complicated by CHF exacerbation.   Pneumometavirus pneumonia Appears mildly improved today.  He is still very tachypneic/short of breath and having difficulty with adherence to CPAP.  He remains febrile overnight with a T-max of 100.7 and saturating at 94-98% on 5 L nasal cannula.  Continues to have significant cough. -Continue Tylenol for fever  -Continue Robitussin, Tessalon for cough -Continue lidocaine patch for back pain secondary to cough -Keep SPO2 around 90 to 93% wean as tolerated - VBG prn if patient develops acute respiratory distress  Biventricular heart failure(EF 20-25%, 05/2020) Patient had central venous catheter placed overnight.  He tolerated the procedure well.  CVP is mildly elevated at 13 and  co-ox of 56.5.  Continues to diurese well on torsemide 60 mg daily with 3.6 L out over the last 24 hours.  Patient subjectively feels better than when admitted.  Lung exam improved with less crackles and lower extremity edema improved at roughly 1+ bilaterally today. -We will continue guideline directed therapy with Entresto 49-51 mg twice daily, spironolactone 25 mg daily, dapagliflozin 10 mg daily, torsemide 60 mg daily.   -Strict ins and outs -Fluid and salt restricted diet -Daily weights -Titrate medications as needed -Trend CVP and Co-ox. -She advanced heart failure's assistance with this patient  Atrial  fibrillation w/ RVR: Patient remains in atrial fibrillation with rates ranging from 100- 130 bpm on digoxin 0.125 mg and amiodarone gtt. patient is tolerating this well.  Patient is tolerating heparin infusion without evidence of bleeding -Continue digoxin, amiodarone -Continue heparin  OSA Patient was still unable to tolerate CPAP overnight due to frequent urination.  We will place an order for condom cath and educated patient regarding the importance of wearing his CPAP whenever he is resting. -CPAP prn  Best Practice: Diet: HH/Fluid restriction  IVF: PO intake (fluid restriction of 1500 cc) VTE: Heparin gtt Code: Full PT/OT: Pending DISPO: Pending   Anticipated discharge in 1-2 days pending clinical improvement.  Lawerance Cruel, D.O.  Internal Medicine Resident, PGY-2 [facility]  Pager: [phone] 5:30 AM, 10/11/2020   Please contact the on call pager after 5 pm and on weekends at [phone].

## 2020-10-11 NOTE — Progress Notes (Signed)
Have encouraged pt multiple times to wear CPAP pt continues to state he is not ready for it will continue to monitor

## 2020-10-11 NOTE — Progress Notes (Signed)
[place] for IV heparin Indication: atrial fibrillation  Allergies  Allergen Reactions  . Coreg [Carvedilol] Shortness Of Breath and Diarrhea    Wheezing     Patient Measurements: Height: 6' (182.9 cm) Weight: (!) 212.4 kg (468 lb 2.9 oz) IBW/kg (Calculated) : 77.6 Heparin Dosing Weight: 131 kg  Vital Signs: Temp: 99.3 F (37.4 C) (03/19 1947) Temp Source: Oral (03/19 1947) BP: 100/72 (03/19 1947) Pulse Rate: 98 (03/19 1947)  Labs: Recent Labs    10/09/20 0116 10/10/20 0217 10/10/20 1336 10/10/20 2304 10/11/20 0500 10/11/20 0536 10/11/20 1027 10/11/20 1657 10/11/20 1900  HGB 13.3 13.4  --   --   --   --   --   --   --   HCT 41.1 41.4  --   --   --   --   --   --   --   PLT 252 253  --   --   --   --   --   --   --   HEPARINUNFRC  --   --   --    < >  --    < > 0.25* 0.32 0.20*  CREATININE 1.12 1.19 1.30*  --  1.31*  --   --   --   --    < > = values in this interval not displayed.    Estimated Creatinine Clearance: 146.4 mL/min (A) (by C-G formula based on SCr of 1.31 mg/dL (H)).  Assessment: 35 yo male with new onset afib.  Had been receiving 100 mg of lovenox (0.5 mg/kg) for DVT prophylaxis.  Last dose today 945 AM. Pharmacy asked to begin anticoagulation with IV heparin.    Evening heparin level remains subtherapeutic and has trended down to 0.2. No overt s/s of bleeding per RN   Goal of Therapy:  Heparin level 0.3-0.7 units/ml Monitor platelets by anticoagulation protocol: Yes   Plan:  Bolus heparin 1500 units IV once. Increase IV heparin to 2,600 units/hr   F/u 6 hr heparin level Monitor daily HL, CBC and s/sx bleeding  Albertina Parr, PharmD., BCPS, BCCCP Clinical Pharmacist Please refer to [facility] for unit-specific pharmacist

## 2020-10-11 NOTE — Progress Notes (Signed)
Advanced Heart Failure Rounding Note   Subjective:    Resting comfortably.   Breathing better but still SOB and tachypneic. Diuresing well. Febrile to 101. Still with some cough,   CVP 12-13. Co-ox 56%  Converted to NSR at 230p on IV amio    Objective:   Weight Range:  Vital Signs:   Temp:  [98 F (36.7 C)-101.1 F (38.4 C)] 98.3 F (36.8 C) (03/19 1156) Pulse Rate:  [50-181] 67 (03/19 1156) Resp:  [17-37] 20 (03/19 1156) BP: (90-133)/(45-119) 110/85 (03/19 1156) SpO2:  [81 %-100 %] 90 % (03/19 1156) Last BM Date: 10/09/20  Weight change: Filed Weights   10/08/20 1120 10/10/20 0300  Weight: (!) 211.4 kg (!) 212.4 kg    Intake/Output:   Intake/Output Summary (Last 24 hours) at 10/11/2020 1508 Last data filed at 10/11/2020 1500 Gross per 24 hour  Intake 2157.76 ml  Output 3450 ml  Net -1292.24 ml     Physical Exam: General:  Obese male lying in bed mildly tachypneic HEENT: normal Neck: supple. JVP hard to see. RIJ central line Carotids 2+ bilat; no bruits. No lymphadenopathy or thryomegaly appreciated. Cor: PMI nondisplaced. Regular rate & rhythm. No rubs, gallops or murmurs. Lungs: coarse Abdomen: marhely obese soft, nontender, nondistended. No hepatosplenomegaly. No bruits or masses. Good bowel sounds. Extremities: no cyanosis, clubbing, rash, 1+edema Neuro: alert & orientedx3, cranial nerves grossly intact. moves all 4 extremities w/o difficulty. Affect pleasant  Telemetry:  Sinus 60-70 Personally reviewed   Labs: Basic Metabolic Panel: Recent Labs  Lab 10/08/20 1031 10/09/20 0116 10/10/20 0217 10/10/20 1336 10/11/20 0500  NA 132* 131* 133* 131* 131*  K 3.4* 3.6 4.0 3.8 4.0  CL 96* 96* 93* 96* 94*  CO2 '25 27 31 26 29  '$ GLUCOSE 137* 118* 131* 130* 127*  BUN '7 9 12 15 15  '$ CREATININE 1.13 1.12 1.19 1.30* 1.31*  CALCIUM 8.9 8.7* 9.0 8.8* 8.6*  MG  --   --   --  1.7 1.8  PHOS  --   --   --  4.1 4.1    Liver Function Tests: Recent Labs   Lab 10/08/20 1115 10/10/20 1336 10/11/20 0500  AST 44*  --  58*  ALT 36  --  50*  ALKPHOS 70  --  53  BILITOT 1.3*  --  1.0  PROT 8.1  --  7.7  ALBUMIN 3.3* 3.1* 3.0*   No results for input(s): LIPASE, AMYLASE in the last 168 hours. No results for input(s): AMMONIA in the last 168 hours.  CBC: Recent Labs  Lab 10/08/20 1031 10/09/20 0116 10/10/20 0217  WBC 7.2 6.1 6.9  HGB 14.0 13.3 13.4  HCT 41.8 41.1 41.4  MCV 92.7 93.6 93.9  PLT 265 252 253    Cardiac Enzymes: No results for input(s): CKTOTAL, CKMB, CKMBINDEX, TROPONINI in the last 168 hours.  BNP: BNP (last 3 results) Recent Labs    10/02/20 1015 10/08/20 1115 10/10/20 1336  BNP 153.4* 93.2 126.9*    ProBNP (last 3 results) No results for input(s): PROBNP in the last 8760 hours.    Other results:  Imaging: DG CHEST PORT 1 VIEW  Result Date: 10/10/2020 CLINICAL DATA:  Central line placement EXAM: PORTABLE CHEST 1 VIEW COMPARISON:  October 08, 2020 FINDINGS: A right central line terminates in the central SVC. No pneumothorax. Diffuse bilateral pulmonary infiltrates are markedly worsened in the interval. Cardiomegaly. No other acute abnormalities. IMPRESSION: 1. The right central line terminates in the  central SVC without pneumothorax. 2. Diffuse bilateral pulmonary infiltrates are worrisome for a diffuse infectious process given CT pulmonary angiogram findings of pneumonia from yesterday. Electronically Signed   By: Dorise Bullion III M.D   On: 10/10/2020 21:56   [place] EKG SITE RITE  Result Date: 10/10/2020 If Site Rite image not attached, placement could not be confirmed due to current cardiac rhythm.     Medications:     Scheduled Medications: . acetaminophen  650 mg Oral Q6H   Or  . acetaminophen  650 mg Rectal Q6H  . Chlorhexidine Gluconate Cloth  6 each Topical Daily  . dapagliflozin propanediol  10 mg Oral Daily  . digoxin  0.125 mg Oral Daily  . guaiFENesin  5 mL Oral Q4H  . influenza vac  split quadrivalent PF  0.5 mL Intramuscular Tomorrow-1000  . insulin aspart  0-15 Units Subcutaneous TID WC  . lidocaine  1 patch Transdermal Q24H  . lidocaine (PF)  5 mL Intradermal Once  . sacubitril-valsartan  1 tablet Oral BID  . spironolactone  25 mg Oral Daily  . torsemide  60 mg Oral Daily     Infusions: . amiodarone 30 mg/hr (10/11/20 0500)  . heparin 2,400 Units/hr (10/11/20 1144)     PRN Medications:  benzonatate, levalbuterol   Assessment/Plan:    1. Acute hypoxic respiratory failure due to [place] - CXR and Chest CT w/ bilateral lower lobe bronchopneumonia worse on the right - COVID negative  - Respiratory panel + for Metapneumovirus  - supportive care - Management per primary   2. Atrial Fibrillation w/ RVR - new diagnosis - suspect triggered by acute viral infection (PNA)  - also in setting of structural heart diease and underlying OSA w/ poor compliance w/ CPAP - K normal, 4.0. Check Mg level and TSH  - no Cardizem w/ BiV dysfunction, avoid ? blocker w/ acute CHF decompensation - Converted to SR on IV amio. Will continue.  - Keep K > 4.0 and Mg > 2.0  - continue digoxin 0.125 - ivabradine stopped  - continue heparin gtt. Can switch to DOAC prior to d/c   3. Acute on Chronic Biventricular Heart Failure  - Known cardiomyopathy since 2019. Echo 02/27/2020 his admission with EF <20%, moderate LV dilation, severely decreased RV function with severe RV dilation, severe biatrial enlargement, mild MR. No history of ETOH/drugs. No FH of cardiomyopathy (mother with [place]). 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 Suspect he is too large for cardiac MRI. Narrow QRS on ECG so not CRT candidate. RHC/LHC on 03/04/20 with no CAD (nonischemic cardiomyopathy), elevated left and right heart filling pressures, and low CI at 2.1. 05/2020 Echo showed EF 20-25%. RV was not well visualized. Saw EP but needs to lose weight  for ICD. - Initial CXR w/ cardiomegaly and pulmonary vascular congestion - BNP only 92 but may be falsely low in setting of obesity  - Initially received IV Lasix. Net I/Os negative 4.5L. Now back on PO toresemide - Diuresing well. CVP 12. Co-ox 56. Would continue po torsemide. Would not overdiurese in setting of active PNA - Continue Entresto 49-51 bid - Continue Spiro 25 mg daily  - Continue Farxiga 10 mg daily  - Continue digoxin 0.125 mg daily. Check dig level   - Off ivabradine with new Afib  - Strict I/Os and daily wts   4. OSA - CPAP QHS   5. Super Morbid Obesity  Body mass index is 63.5 kg/m. -  wt loss advised     Length of Stay: 3   Glori Bickers MD 10/11/2020, 3:08 PM  Advanced Heart Failure Team Pager [phone] (M-F; 7a - 4p)  Please contact [facility] for night-coverage after hours (4p -7a ) and weekends on [email or web address]

## 2020-10-11 NOTE — Progress Notes (Signed)
Pt converted to NSR

## 2020-10-11 NOTE — Progress Notes (Signed)
Pt states he is not a diabetic and will not take insulin.  Pt also is refusing is scheduled medication. MD Coe aware. Will CTM.

## 2020-10-12 LAB — CBC
HCT: 40.8 % (ref 39.0–52.0)
Hemoglobin: 13.2 g/dL (ref 13.0–17.0)
MCH: 29.9 pg (ref 26.0–34.0)
MCHC: 32.4 g/dL (ref 30.0–36.0)
MCV: 92.5 fL (ref 80.0–100.0)
Platelets: 214 10*3/uL (ref 150–400)
RBC: 4.41 MIL/uL (ref 4.22–5.81)
RDW: 13.6 % (ref 11.5–15.5)
WBC: 7.1 10*3/uL (ref 4.0–10.5)
nRBC: 0 % (ref 0.0–0.2)

## 2020-10-12 LAB — COMPREHENSIVE METABOLIC PANEL
ALT: 57 U/L — ABNORMAL HIGH (ref 0–44)
AST: 60 U/L — ABNORMAL HIGH (ref 15–41)
Albumin: 2.9 g/dL — ABNORMAL LOW (ref 3.5–5.0)
Alkaline Phosphatase: 46 U/L (ref 38–126)
Anion gap: 8 (ref 5–15)
BUN: 12 mg/dL (ref 6–20)
CO2: 30 mmol/L (ref 22–32)
Calcium: 8.5 mg/dL — ABNORMAL LOW (ref 8.9–10.3)
Chloride: 93 mmol/L — ABNORMAL LOW (ref 98–111)
Creatinine, Ser: 1.14 mg/dL (ref 0.61–1.24)
GFR, Estimated: >60 mL/min (ref >60)
Glucose, Bld: 116 mg/dL — ABNORMAL HIGH (ref 70–99)
Potassium: 4 mmol/L (ref 3.5–5.1)
Sodium: 131 mmol/L — ABNORMAL LOW (ref 135–145)
Total Bilirubin: 0.6 mg/dL (ref 0.3–1.2)
Total Protein: 7.9 g/dL (ref 6.5–8.1)

## 2020-10-12 LAB — HEPARIN LEVEL (UNFRACTIONATED)
Heparin Unfractionated: 0.49 IU/mL (ref 0.30–0.70)
Heparin Unfractionated: 0.36 IU/mL (ref 0.30–0.70)

## 2020-10-12 LAB — COOXEMETRY PANEL
Carboxyhemoglobin: 1 % (ref 0.5–1.5)
Methemoglobin: 1 % (ref 0.0–1.5)
O2 Saturation: 65 %
Total hemoglobin: 13.2 g/dL (ref 12.0–16.0)

## 2020-10-12 LAB — GLUCOSE, CAPILLARY
Glucose-Capillary: 110 mg/dL — ABNORMAL HIGH (ref 70–99)
Glucose-Capillary: 159 mg/dL — ABNORMAL HIGH (ref 70–99)
Glucose-Capillary: 115 mg/dL — ABNORMAL HIGH (ref 70–99)
Glucose-Capillary: 111 mg/dL — ABNORMAL HIGH (ref 70–99)

## 2020-10-12 MED ORDER — PHENOL 1.4 % MT LIQD
1.0000 | OROMUCOSAL | Status: DC | PRN
  Filled 2020-10-12: qty 177

## 2020-10-12 NOTE — Evaluation (Signed)
Physical Therapy Evaluation Patient Details Name: Phillip Young MRN: [id number] DOB: [birth year] Today's Date: 10/12/2020   History of Present Illness  Patient is a 35 y/o male who presents with SOB, cough, fevers 10/08/20. CXR- RLL opacity. Admitted with pneumometavirus of the lower respiratory tract and respiratory failure. PMH includes OSA, HTN, Biventricular congestive heart failure.  Clinical Impression  Patient presents with dyspnea on exertion, decreased cardiovascular endurance, impaired activity tolerance and impaired mobility s/p above. Pt lives with uncle and reports being independent for ADLs/IADLs PTA working as a Recruitment consultant. Today, pt tolerated transfers and gait training with supervision for safety. Pt with poor awareness of medical condition and abilities at this time. Needs cues to self pace and monitor for symptoms during mobility as well as follow fluid restrictions etc. Education re: energy conservation techniques, pursed lip breathing, importance of upright and short bouts of activity with longer rest breaks. Sp02 ranged from 85-92% on 4-6L/min 02 [place] with activity. HR up to 123 bpm during exercise. Needed frequent standing rest breaks due to 3/4 DOE. Anticipate pt improve with increased activity in appropriate parameters. Will follow acutely to maximize independence and mobility prior to return home.    Follow Up Recommendations No PT follow up;Supervision - Intermittent    Equipment Recommendations  None recommended by PT    Recommendations for Other Services       Precautions / Restrictions Precautions Precautions: Other (comment) Precaution Comments: watch 02 Restrictions Weight Bearing Restrictions: No      Mobility  Bed Mobility               General bed mobility comments: Standing at sink upon PT arrival.    Transfers Overall transfer level: Modified independent Equipment used: None             General transfer comment: Stood from EOB without  difficulty.  Ambulation/Gait Ambulation/Gait assistance: Supervision Gait Distance (Feet): 320 Feet Assistive device: None Gait Pattern/deviations: Step-through pattern;Decreased stride length;Wide base of support;Decreased step length - right;Decreased step length - left Gait velocity: varies based on patient Gait velocity interpretation: <1.31 ft/sec, indicative of household ambulator General Gait Details: Initially, slow waddling like gait with 1 instance of brisk walking despite cues to slow down, "I need to get back to normal." 4 standing rest breaks leaning against wall for rest. Sp02 initially on 4L increased to 6L due to drop in Sp02 to 85% with activity. Able to maintain >88% the rest of ambulation on 6L 02 [place]. Cues for pursed lip breathing. HR up to 123 bpm.  Stairs            Wheelchair Mobility    Modified Rankin (Stroke Patients Only)       Balance Overall balance assessment: Needs assistance Sitting-balance support: Feet supported;No upper extremity supported Sitting balance-Leahy Scale: Good     Standing balance support: During functional activity Standing balance-Leahy Scale: Good                               Pertinent Vitals/Pain Pain Assessment: Faces Faces Pain Scale: Hurts little more Pain Location: headache, back Pain Descriptors / Indicators: Aching;Headache;Sore Pain Intervention(s): Monitored during session;Repositioned;RN gave pain meds during session    Home Living Family/patient expects to be discharged to:: Private residence Living Arrangements: Other relatives (uncle) Available Help at Discharge: Family;Available PRN/intermittently Type of Home: House Home Access: Level entry     Home Layout: One level Home Equipment: None  Prior Function Level of Independence: Independent         Comments: 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 Independent for ADLs/IADLs.     Hand Dominance        Extremity/Trunk Assessment   Upper  Extremity Assessment Upper Extremity Assessment: Defer to OT evaluation    Lower Extremity Assessment Lower Extremity Assessment: Generalized weakness (but functional)       Communication   Communication: No difficulties  Cognition Arousal/Alertness: Awake/alert Behavior During Therapy: WFL for tasks assessed/performed Overall Cognitive Status: Within Functional Limits for tasks assessed                                 General Comments: Pt not wanting to be in the hospital and does what he wants to do.      General Comments General comments (skin integrity, edema, etc.): Sp02 ranged from 85-92% on 4-6L/min 02 [place] with activity. HR up to 123 bpm during exercise.    Exercises     Assessment/Plan    PT Assessment Patient needs continued PT services  PT Problem List Decreased strength;Decreased mobility;Decreased activity tolerance;Cardiopulmonary status limiting activity       PT Treatment Interventions Therapeutic exercise;Gait training;Patient/family education;Therapeutic activities;Functional mobility training;Stair training    PT Goals (Current goals can be found in the Care Plan section)  Acute Rehab PT Goals Patient Stated Goal: "to get back to normal" PT Goal Formulation: With patient Time For Goal Achievement: 10/26/20 Potential to Achieve Goals: Good    Frequency Min 3X/week   Barriers to discharge        Co-evaluation               AM-PAC PT "6 Clicks" Mobility  Outcome Measure Help needed turning from your back to your side while in a flat bed without using bedrails?: None Help needed moving from lying on your back to sitting on the side of a flat bed without using bedrails?: None Help needed moving to and from a bed to a chair (including a wheelchair)?: A Little Help needed standing up from a chair using your arms (e.g., wheelchair or bedside chair)?: A Little Help needed to walk in hospital room?: A Little Help needed climbing 3-5  steps with a railing? : A Little 6 Click Score: 20    End of Session Equipment Utilized During Treatment: Oxygen Activity Tolerance: Treatment limited secondary to medical complications (Comment) (drop in Sp02) Patient left: in bed;with call bell/phone within reach (sitting EOB) Nurse Communication: Mobility status PT Visit Diagnosis: Other (comment);Difficulty in walking, not elsewhere classified (R26.2) (dyspnea)    Time: [id number] PT Time Calculation (min) (ACUTE ONLY): 30 min   Charges:   PT Evaluation $PT Eval Moderate Complexity: 1 Mod PT Treatments $Gait Training: 8-22 mins        Marisa Severin, PT, DPT Acute Rehabilitation Services Pager [phone] Office [place] 10/12/2020, 10:49 AM

## 2020-10-12 NOTE — Progress Notes (Signed)
Patient continues to deny insulin.  Will DC sliding scale, and continue continuous glucose monitoring.

## 2020-10-12 NOTE — Progress Notes (Signed)
Pt is at 1500 for fluid restriction.

## 2020-10-12 NOTE — Progress Notes (Signed)
Noted patients IV pump was alarming, once entering the room 2 lines were disconnected completed from his CVL exposing the catheter hubs.  Pt stated he was trying to get to the bathroom and they popped off then he proceeded to clamp off all of his IV lines.  I scrubbed the hubs of all the lines and changed the tubing. Educated pt to not touch the central line nor any of the IV  Equipment, pt stated he understood and he would not touch them again.

## 2020-10-12 NOTE — Progress Notes (Signed)
HD#4 SUBJECTIVE:  Overnight Events: Difficulty sleeping, did not use CPAP.  Converted to NSR  Patient sitting up at bedside, eating breakfast.  States he is unable to use his CPAP secondary to frequent urination.  He is unable to take his home torsemide during the day at home because he works as a [employer] consultant.  This limits his ability to urinate frequently.  With therefore infuses having to urinate at night, so he does not wear his CPAP.  Overall he states he feels better, he still has some shortness of breath with using the bathroom and ambulating.  He states he is overall frustrated with being hospitalized, but is grateful for the staff.  Discussed with him it is important to take his medications and to follow nursing instructions.  He agrees to do these things, just states it is difficult when he has to frequently urinate.  OBJECTIVE:  Vital Signs: Vitals:   10/11/20 1156 10/11/20 1619 10/11/20 1947 10/12/20 0100  BP: 110/85 118/74 100/72 122/77  Pulse: 67 (!) 105 98 99  Resp: '20 18 19   '$ Temp: 98.3 F (36.8 C) (!) 100.9 F (38.3 C) 99.3 F (37.4 C) 98.1 F (36.7 C)  TempSrc: Oral Oral Oral Oral  SpO2: 90% 93% 98%   Weight:      Height:       Supplemental O2: Nasal Cannula SpO2: 98 % O2 Flow Rate (L/min): 4 L/min  Filed Weights   10/08/20 1120 10/10/20 0300  Weight: (!) 211.4 kg (!) 212.4 kg     Intake/Output Summary (Last 24 hours) at 10/12/2020 0626 Last data filed at 10/11/2020 1949 Gross per 24 hour  Intake 1458.35 ml  Output 1600 ml  Net -141.65 ml   Net IO Since Admission: -4,488.93 mL [10/12/20 0626]  Constitutional: Obese, uncomfortable appearing HENT: normocephalic atraumatic Eyes: conjunctiva non-erythematous Neck: supple, difficult to assess JVP in setting of body habitus.  R IJ central line in place, nontender nonerythematous. Cardiovascular: regular rate and rhythm, no m/r/g Pulmonary/Chest: Diminished breath sounds.  Saturating well on 4 L nasal  cannula.  No obvious wheezing or rhonchi Abdominal: soft, non-tender, non-distended MSK: normal bulk and tone Neurological: alert & oriented x 3, 5/5 strength in bilateral upper and lower extremities, normal gait Skin: warm and dry.  1+ lower extremity edema Psych: Normal thought process  Telemetry-normal sinus rhythm  Patient Lines/Drains/Airways Status    Active Line/Drains/Airways    Name Placement date Placement time Site Days   Peripheral IV 10/09/20 Right;Anterior Forearm 10/09/20  0523  Forearm  2   CVC Triple Lumen 10/10/20 Right Internal jugular 10/10/20  2135  - 1          Recent Labs    10/11/20 1230 10/11/20 1607 10/12/20 0443  GLUCAP 128* 149* 110*     Pertinent Labs: CBC Latest Ref Rng & Units 10/12/2020 10/10/2020 10/09/2020  WBC 4.0 - 10.5 K/uL 7.1 6.9 6.1  Hemoglobin 13.0 - 17.0 g/dL 13.2 13.4 13.3  Hematocrit 39.0 - 52.0 % 40.8 41.4 41.1  Platelets 150 - 400 K/uL 214 253 252    CMP Latest Ref Rng & Units 10/11/2020 10/10/2020 10/10/2020  Glucose 70 - 99 mg/dL 127(H) 130(H) 131(H)  BUN 6 - 20 mg/dL '15 15 12  '$ Creatinine 0.61 - 1.24 mg/dL 1.31(H) 1.30(H) 1.19  Sodium 135 - 145 mmol/L 131(L) 131(L) 133(L)  Potassium 3.5 - 5.1 mmol/L 4.0 3.8 4.0  Chloride 98 - 111 mmol/L 94(L) 96(L) 93(L)  CO2 22 -  32 mmol/L '29 26 31  '$ Calcium 8.9 - 10.3 mg/dL 8.6(L) 8.8(L) 9.0  Total Protein 6.5 - 8.1 g/dL 7.7 - -  Total Bilirubin 0.3 - 1.2 mg/dL 1.0 - -  Alkaline Phos 38 - 126 U/L 53 - -  AST 15 - 41 U/L 58(H) - -  ALT 0 - 44 U/L 50(H) - -   CVP: 13  Co-ox: 56.5 FENa: 0.5%  Imaging: No results found.  ASSESSMENT/PLAN:   Active Problems:   CAP (community acquired pneumonia)   Patient Summary: Phillip Young a 35 y.o.with pertinent PMH of biventricular heart failure, diabetes, HLD, OSA, recent Covid infectionwho presented withdry cough, dyspnea on exertionand willadmit forPMV pneumonia complicated by CHF exacerbation.   Pneumometavirus pneumonia Acute  hypoxic respiratory failure Appears mildly improved today.  Continues to be short of breath requiring supplemental oxygen.  He remains febrile overnight with a T-max of 100.9 and saturating at 94-98% on 4 L nasal cannula.  Continues to have significant cough.  - Continue conservative treatment. - Continue Tylenol for fever  - Continue Robitussin, Tessalon for cough - Continue lidocaine patch for back pain secondary to cough - Keep SPO2 around 90 to 93% wean as tolerated requirement improving - VBG prn if patient develops acute respiratory distress  Biventricular heart failure(EF 20-25%, 05/2020) Central venous catheter in place.  CO-OX of 65%, improving.  CVP is in the 90s to 80s, believe these are being recorded improperly. Yesterday CVP 12-13.  Heart failure team on board, appreciate their recommendations. -We will continue guideline directed therapy with Entresto 49-51 mg twice daily, spironolactone 25 mg daily, dapagliflozin 10 mg daily, torsemide 60 mg daily.   -Strict ins and outs -Fluid and salt restricted diet -Daily weights, patient has not been weighed in 2 days. -Titrate medications as needed -Trend CVP and Co-ox.  Atrial fibrillation w/ RVR: Converted to normal sinus rhythm overnight.  -Continue digoxin, amiodarone -Continue heparin  OSA Patient was still unable to tolerate CPAP overnight due to frequent urination.  We will place an order for condom cath and educated patient regarding the importance of wearing his CPAP whenever he is resting. -CPAP prn  Best Practice: Diet: HH/Fluid restriction  IVF: PO intake (fluid restriction of 1500 cc) VTE: heparin bolus via infusion 1,500 Units Start: 10/11/20 2100Heparin gtt Code: Full PT/OT: Pending DISPO: Pending  Anticipated discharge in 1-2 days pending clinical improvement.  Sanjuana Letters DO  Internal Medicine Resident PGY-1 [place]  Pager: [phone]  Please contact the on call pager after 5 pm and on  weekends at [phone].

## 2020-10-12 NOTE — Progress Notes (Signed)
[place] for IV heparin Indication: atrial fibrillation  Allergies  Allergen Reactions  . Coreg [Carvedilol] Shortness Of Breath and Diarrhea    Wheezing     Patient Measurements: Height: 6' (182.9 cm) Weight: (!) 210.5 kg (464 lb) IBW/kg (Calculated) : 77.6 Heparin Dosing Weight: 131 kg  Vital Signs: Temp: 99 F (37.2 C) (03/20 0715) Temp Source: Oral (03/20 0715) BP: 107/74 (03/20 0715) Pulse Rate: 100 (03/20 0715)  Labs: Recent Labs    10/10/20 0217 10/10/20 1336 10/10/20 2304 10/11/20 0500 10/11/20 0536 10/11/20 1900 10/12/20 0433 10/12/20 0452 10/12/20 0739 10/12/20 0928  HGB 13.4  --   --   --   --   --   --  13.2  --   --   HCT 41.4  --   --   --   --   --   --  40.8  --   --   PLT 253  --   --   --   --   --   --  214  --   --   HEPARINUNFRC  --   --    < >  --    < > 0.20* 0.49  --   --  0.36  CREATININE 1.19 1.30*  --  1.31*  --   --   --   --  1.14  --    < > = values in this interval not displayed.    Estimated Creatinine Clearance: 167.3 mL/min (by C-G formula based on SCr of 1.14 mg/dL).  Assessment: 35 yo male with new onset afib.  Had been receiving 100 mg of lovenox (0.5 mg/kg) for DVT prophylaxis.  Last dose today 945 AM. Pharmacy asked to begin anticoagulation with IV heparin.    HL remains therapeutic (0.36) on gtt at current rate. CBC wnl today. Will transition to monitoring daily HLs now.    Goal of Therapy:  Heparin level 0.3-0.7 units/ml Monitor platelets by anticoagulation protocol: Yes   Plan:  Continue IV heparin at 2,600 units/hr Monitor daily HL, CBC and s/sx bleeding F/u transition to PO anticoagulation  Mercy Riding, PharmD PGY1 Acute Care Pharmacy Resident Please refer to [facility] for unit-specific pharmacist

## 2020-10-12 NOTE — Progress Notes (Signed)
ANTICOAGULATION CONSULT NOTE - Follow Up Consult  Pharmacy Consult for heparin Indication: atrial fibrillation  Labs: Recent Labs    10/10/20 0217 10/10/20 1336 10/10/20 2304 10/11/20 0500 10/11/20 0536 10/11/20 1657 10/11/20 1900 10/12/20 0433 10/12/20 0452  HGB 13.4  --   --   --   --   --   --   --  13.2  HCT 41.4  --   --   --   --   --   --   --  40.8  PLT 253  --   --   --   --   --   --   --  214  HEPARINUNFRC  --   --    < >  --    < > 0.32 0.20* 0.49  --   CREATININE 1.19 1.30*  --  1.31*  --   --   --   --   --    < > = values in this interval not displayed.    Assessment/Plan:  35yo male therapeutic on heparin after rate changes. Will continue gtt at current rate of 2600 units/hr and confirm stable with additional level.   Wynona Neat, PharmD, BCPS  10/12/2020,5:20 AM

## 2020-10-12 NOTE — Progress Notes (Signed)
Notified by RN Herbert Spires, when she went into patient's room 2 of his IV lines were disconnected leaving catheter hub exposed.  Patient stated he had to go to the bathroom and when he did the clamps popped off.  Hubs were then scrubbed and tubing was changed.  He was educated by the RN about this.  I also discussed with the patient the risk of infection, which may exacerbate his illness further.  He also requested an increase in fluid restriction.  He is still 20 pounds above his base weight of 440.  Discussed with him that we will keep it where it is and give him sponges to moisten his mouth as well as a few ice chips.  He is agreeable to wear a condom cath to prevent this from occurring in the future.

## 2020-10-13 DIAGNOSIS — I5043 Acute on chronic combined systolic (congestive) and diastolic (congestive) heart failure: Secondary | ICD-10-CM

## 2020-10-13 DIAGNOSIS — J9601 Acute respiratory failure with hypoxia: Secondary | ICD-10-CM

## 2020-10-13 LAB — CBC
HCT: 38.2 % — ABNORMAL LOW (ref 39.0–52.0)
Hemoglobin: 12.7 g/dL — ABNORMAL LOW (ref 13.0–17.0)
MCH: 30.8 pg (ref 26.0–34.0)
MCHC: 33.2 g/dL (ref 30.0–36.0)
MCV: 92.5 fL (ref 80.0–100.0)
Platelets: 214 10*3/uL (ref 150–400)
RBC: 4.13 MIL/uL — ABNORMAL LOW (ref 4.22–5.81)
RDW: 13.7 % (ref 11.5–15.5)
WBC: 8 10*3/uL (ref 4.0–10.5)
nRBC: 0 % (ref 0.0–0.2)

## 2020-10-13 LAB — GLUCOSE, CAPILLARY
Glucose-Capillary: 154 mg/dL — ABNORMAL HIGH (ref 70–99)
Glucose-Capillary: 112 mg/dL — ABNORMAL HIGH (ref 70–99)
Glucose-Capillary: 152 mg/dL — ABNORMAL HIGH (ref 70–99)
Glucose-Capillary: 153 mg/dL — ABNORMAL HIGH (ref 70–99)

## 2020-10-13 LAB — BASIC METABOLIC PANEL
Anion gap: 6 (ref 5–15)
BUN: 10 mg/dL (ref 6–20)
CO2: 34 mmol/L — ABNORMAL HIGH (ref 22–32)
Calcium: 8.3 mg/dL — ABNORMAL LOW (ref 8.9–10.3)
Chloride: 93 mmol/L — ABNORMAL LOW (ref 98–111)
Creatinine, Ser: 0.99 mg/dL (ref 0.61–1.24)
GFR, Estimated: >60 mL/min (ref >60)
Glucose, Bld: 110 mg/dL — ABNORMAL HIGH (ref 70–99)
Potassium: 3.6 mmol/L (ref 3.5–5.1)
Sodium: 133 mmol/L — ABNORMAL LOW (ref 135–145)

## 2020-10-13 LAB — COOXEMETRY PANEL
Carboxyhemoglobin: 1.2 % (ref 0.5–1.5)
Methemoglobin: 1 % (ref 0.0–1.5)
O2 Saturation: 74.3 %
Total hemoglobin: 12.9 g/dL (ref 12.0–16.0)

## 2020-10-13 LAB — MAGNESIUM: Magnesium: 2.2 mg/dL (ref 1.7–2.4)

## 2020-10-13 LAB — HEPARIN LEVEL (UNFRACTIONATED): Heparin Unfractionated: 0.5 IU/mL (ref 0.30–0.70)

## 2020-10-13 MED ORDER — FUROSEMIDE 10 MG/ML IJ SOLN: 80.0000 mg | Freq: Two times a day (BID) | INTRAMUSCULAR | Status: DC

## 2020-10-13 MED ORDER — POTASSIUM CHLORIDE CRYS ER 20 MEQ PO TBCR: 40.0000 meq | EXTENDED_RELEASE_TABLET | Freq: Once | ORAL | Status: DC

## 2020-10-13 MED ORDER — AMIODARONE HCL 200 MG PO TABS
200.0000 mg | ORAL_TABLET | Freq: Two times a day (BID) | ORAL | Status: DC
  Administered 2020-10-13 – 2020-10-16 (×7): 200 mg via ORAL
  Filled 2020-10-13 (×7): qty 1

## 2020-10-13 MED ORDER — APIXABAN 5 MG PO TABS
5.0000 mg | ORAL_TABLET | Freq: Two times a day (BID) | ORAL | Status: DC
  Administered 2020-10-13 – 2020-10-16 (×7): 5 mg via ORAL
  Filled 2020-10-13 (×7): qty 1

## 2020-10-13 MED ORDER — FUROSEMIDE 10 MG/ML IJ SOLN
80.0000 mg | Freq: Two times a day (BID) | INTRAMUSCULAR | Status: DC
  Administered 2020-10-13 – 2020-10-15 (×6): 80 mg via INTRAVENOUS
  Filled 2020-10-13 (×6): qty 8

## 2020-10-13 MED ORDER — AMIODARONE HCL IN DEXTROSE 360-4.14 MG/200ML-% IV SOLN
INTRAVENOUS | Status: AC
  Filled 2020-10-13: qty 200

## 2020-10-13 MED ORDER — POTASSIUM CHLORIDE CRYS ER 20 MEQ PO TBCR
40.0000 meq | EXTENDED_RELEASE_TABLET | Freq: Two times a day (BID) | ORAL | Status: AC
  Administered 2020-10-13 (×2): 40 meq via ORAL
  Filled 2020-10-13 (×2): qty 2

## 2020-10-13 NOTE — Discharge Instructions (Addendum)
Information on my medicine - ELIQUIS (apixaban)  Why was Eliquis prescribed for you? Eliquis was prescribed for you to reduce the risk of forming blood clots that can cause a stroke if you have a medical condition called atrial fibrillation (a type of irregular heartbeat) OR to reduce the risk of a blood clots forming after orthopedic surgery.  What do You need to know about Eliquis ? Take your Eliquis TWICE DAILY - one tablet in the morning and one tablet in the evening with or without food.  It would be best to take the doses about the same time each day.  If you have difficulty swallowing the tablet whole please discuss with your pharmacist how to take the medication safely.  Take Eliquis exactly as prescribed by your doctor and DO NOT stop taking Eliquis without talking to the doctor who prescribed the medication.  Stopping may increase your risk of developing a new clot or stroke.  Refill your prescription before you run out.  After discharge, you should have regular check-up appointments with your healthcare provider that is prescribing your Eliquis.  In the future your dose may need to be changed if your kidney function or weight changes by a significant amount or as you get older.  What do you do if you miss a dose? If you miss a dose, take it as soon as you remember on the same day and resume taking twice daily.  Do not take more than one dose of ELIQUIS at the same time.  Important Safety Information A possible side effect of Eliquis is bleeding. You should call your healthcare provider right away if you experience any of the following: ? Bleeding from an injury or your nose that does not stop. ? Unusual colored urine (red or dark brown) or unusual colored stools (red or black). ? Unusual bruising for unknown reasons. ? A serious fall or if you hit your head (even if there is no bleeding).  Some medicines may interact with Eliquis and might increase your risk of bleeding  or clotting while on Eliquis. To help avoid this, consult your healthcare provider or pharmacist prior to using any new prescription or non-prescription medications, including herbals, vitamins, non-steroidal anti-inflammatory drugs (NSAIDs) and supplements.  This website has more information on Eliquis (apixaban): [email or web address].  Low Sodium Nutrition Therapy  Eating less sodium can help you if you have high blood pressure, heart failure, or kidney or liver disease.   Your body needs a little sodium, but too much sodium can cause your body to hold onto extra water. This extra water will raise your blood pressure and can cause damage to your heart, kidneys, or liver as they are forced to work harder.   Sometimes you can see how the extra fluid affects you because your hands, legs, or belly swell. You may also hold water around your heart and lungs, which makes it hard to breathe.   Even if you take medication for blood pressure or a water pill (diuretic) to remove fluid, it is still important to have less salt in your diet.   Check with your primary care provider before drinking alcohol since it may affect the amount of fluid in your body and how your heart, kidneys, or liver work. Sodium in Food A low-sodium meal plan limits the sodium that you get from food and beverages to 1,500-2,000 milligrams (mg) per day. Salt is the main source of sodium. Read the nutrition label on the package to find out how  much sodium is in one serving of a food.   Select foods with 140 milligrams (mg) of sodium or less per serving.   You may be able to eat one or two servings of foods with a little more than 140 milligrams (mg) of sodium if you are closely watching how much sodium you eat in a day.   Check the serving size on the label. The amount of sodium listed on the label shows the amount in one serving of the food. So, if you eat more than one serving, you will get more sodium than the amount  listed.  Tips Cutting Back on Sodium  Eat more fresh foods.   Fresh fruits and vegetables are low in sodium, as well as frozen vegetables and fruits that have no added juices or sauces.   Fresh meats are lower in sodium than processed meats, such as bacon, sausage, and hotdogs.   Not all processed foods are unhealthy, but some processed foods may have too much sodium.   Eat less salt at the table and when cooking. One of the ingredients in salt is sodium.   One teaspoon of table salt has 2,300 milligrams of sodium.   Leave the salt out of recipes for pasta, casseroles, and soups.  Be a Paramedic.   Food packages that say Salt-free, sodium-free, very low sodium, and low sodium have less than 140 milligrams of sodium per serving.   Beware of products identified as Unsalted, No Salt Added, Reduced Sodium, or Lower Sodium. These items may still be high in sodium. You should always check the nutrition label.  Add flavors to your food without adding sodium.   Try lemon juice, lime juice, or vinegar.   Dry or fresh herbs add flavor.   Buy a sodium-free seasoning blend or make your own at home.  You can purchase salt-free or sodium-free condiments like barbeque sauce in stores and online. Ask your registered dietitian nutritionist for recommendations and where to find them.    Eating in Restaurants  Choose foods carefully when you eat outside your home. Restaurant foods can be very high in sodium. Many restaurants provide nutrition facts on their menus or their websites. If you cannot find that information, ask your server. Let your server know that you want your food to be cooked without salt and that you would like your salad dressing and sauces to be served on the side.      Foods Recommended  Food Group  Foods Recommended   Grains  Bread, bagels, rolls without salted tops Homemade bread made with reduced-sodium baking powder Cold cereals, especially  shredded wheat and puffed rice Oats, grits, or cream of wheat Pastas, quinoa, and rice Popcorn, pretzels or crackers without salt Corn tortillas   Protein Foods  Fresh meats and fish; [place] bacon (check the nutrition labels - make sure they are not packaged in a sodium solution) Canned or packed tuna (no more than 4 ounces at 1 serving) Beans and peas Soybeans) and tofu Eggs Nuts or nut butters without salt   Dairy  Milk or milk powder Plant milks, such as rice and soy Yogurt, including Greek yogurt Small amounts of natural cheese (blocks of cheese) or reduced-sodium cheese can be used in moderation. (Swiss, ricotta, and fresh mozzarella cheese are lower in sodium than the others) Cream Cheese Low sodium cottage cheese   Vegetables  Fresh and frozen vegetables without added sauces or salt Homemade soups (without salt) Low-sodium, salt-free or sodium-free canned  vegetables and soups   Fruit  Fresh and canned fruits Dried fruits, such as raisins, cranberries, and prunes   Oils  Tub or liquid margarine, regular or without salt Canola, corn, peanut, olive, safflower, or sunflower oils   Condiments  Fresh or dried herbs such as basil, bay leaf, dill, mustard (dry), nutmeg, paprika, parsley, rosemary, sage, or thyme.  Low sodium ketchup Vinegar  Lemon or lime juice Pepper, red pepper flakes, and cayenne. Hot sauce contains sodium, but if you use just a drop or two, it will not add up to much.  Salt-free or sodium-free seasoning mixes and marinades Simple salad dressings: vinegar and oil     Foods Not Recommended  Food Group  Foods Not Recommended   Grains  Breads or crackers topped with salt Cereals (hot/cold) with more than 300 mg sodium per serving Biscuits, cornbread, and other quick breads prepared with baking soda Pre-packaged bread crumbs Seasoned and packaged rice and pasta mixes Self-rising flours   Protein Foods  Cured meats: Bacon, ham, sausage,  pepperoni and hot dogs Canned meats (chili, vienna sausage, or sardines) Smoked fish and meats Frozen meals that have more than 600 mg of sodium per serving Egg substitute (with added sodium)   Dairy  Buttermilk Processed cheese spreads Cottage cheese (1 cup may have over 500 mg of sodium; look for low-sodium.) American or feta cheese Shredded Cheese has more sodium than blocks of cheese String cheese   Vegetables  Canned vegetables (unless they are salt-free, sodium-free or low sodium) Frozen vegetables with seasoning and sauces Sauerkraut and pickled vegetables Canned or dried soups (unless they are salt-free, sodium-free, or low sodium) [place] fries and onion rings   Fruit  Dried fruits preserved with additives that have sodium   Oils  Salted butter or margarine, all types of olives   Condiments  Salt, sea salt, kosher salt, onion salt, and garlic salt Seasoning mixes with salt Bouillon cubes Ketchup Barbeque sauce and Worcestershire sauce unless low sodium Soy sauce Salsa, pickles, olives, relish Salad dressings: ranch, blue cheese, [place], and [place].     Low Sodium Sample 1-Day Menu   Breakfast  1 cup cooked oatmeal   1 slice whole wheat bread toast   1 tablespoon peanut butter without salt   1 banana   1 cup 1% milk   Lunch  Tacos made with: 2 corn tortillas    cup black beans, low sodium    cup roasted or grilled chicken (without skin)    avocado   Squeeze of lime juice   1 cup salad greens   1 tablespoon low-sodium salad dressing    cup strawberries   1 orange   Afternoon Snack  1/3 cup grapes   6 ounces yogurt   Evening Meal  3 ounces herb-baked fish   1 baked potato   2 teaspoons olive oil    cup cooked carrots   2 thick slices tomatoes on:   2 lettuce leaves   1 teaspoon olive oil   1 teaspoon balsamic vinegar   1 cup 1% milk   Evening Snack  1 apple    cup almonds without salt     Low-Sodium  Vegetarian (Lacto-Ovo) Sample 1-Day Menu   Breakfast  1 cup cooked oatmeal   1 slice whole wheat toast   1 tablespoon peanut butter without salt   1 banana   1 cup 1% milk   Lunch  Tacos made with: 2 corn tortillas    cup  black beans, low sodium    cup roasted or grilled chicken (without skin)    avocado   Squeeze of lime juice   1 cup salad greens   1 tablespoon low-sodium salad dressing    cup strawberries   1 orange   Evening Meal  Stir fry made with:  cup tofu   1 cup brown rice    cup broccoli    cup green beans    cup peppers    tablespoon peanut oil   1 orange   1 cup 1% milk   Evening Snack  4 strips celery   2 tablespoons hummus   1 hard-boiled egg     Low-Sodium Vegan Sample 1-Day Menu   Breakfast  1 cup cooked oatmeal   1 tablespoon peanut butter without salt   1 cup blueberries   1 cup soymilk fortified with calcium, vitamin B12, and vitamin D   Lunch  1 small whole wheat pita    cup cooked lentils   2 tablespoons hummus   4 carrot sticks   1 medium apple   1 cup soymilk fortified with calcium, vitamin B12, and vitamin D   Evening Meal  Stir fry made with:  cup tofu   1 cup brown rice    cup broccoli    cup green beans    cup peppers    tablespoon peanut oil   1 cup cantaloupe   Evening Snack  1 cup soy yogurt    cup mixed nuts   Copyright 2020  Academy of [employer]. All rights reserved    Sodium Free Flavoring Tips    When cooking, the following items may be used for flavoring instead of salt or seasonings that contain sodium.  Remember: A little bit of spice goes a long way! Be careful not to overseason.  Spice Blend Recipe (makes about ? cup)  5 teaspoons onion powder   2 teaspoons garlic powder   2 teaspoons paprika   2 teaspoon dry mustard   1 teaspoon crushed thyme leaves    teaspoon white pepper    teaspoon celery seed Food Item  Flavorings  Beef Basil, bay leaf, caraway, curry, dill, dry mustard, garlic, grape jelly, green pepper, mace, marjoram, mushrooms (fresh), nutmeg, onion or onion powder, parsley, pepper, rosemary, sage  Chicken Basil, cloves, cranberries, mace, mushrooms (fresh), nutmeg, oregano, paprika, parsley, pineapple, saffron, sage, savory, tarragon, thyme, tomato, turmeric  Egg Chervil, curry, dill, dry mustard, garlic or garlic powder, green pepper, jelly, mushrooms (fresh), nutmeg, onion powder, paprika, parsley, rosemary, tarragon, tomato  Fish Basil, bay leaf, chervil, curry, dill, dry mustard, green pepper, lemon juice, marjoram, mushrooms (fresh), paprika, pepper, tarragon, tomato, turmeric  Lamb Cloves, curry, dill, garlic or garlic powder, mace, mint, mint jelly, onion, oregano, parsley, pineapple, rosemary, tarragon, thyme  Pork Applesauce, basil, caraway, chives, cloves, garlic or garlic powder, onion or onion powder, rosemary, thyme  Veal Apricots, basil, bay leaf, currant jelly, curry, ginger, marjoram, mushrooms (fresh), oregano, paprika  Vegetables Basil, dill, garlic or garlic powder, ginger, lemon juice, mace, marjoram, nutmeg, onion or onion powder, tarragon, tomato, sugar or sugar substitute, salt-free salad dressing, vinegar  Desserts Allspice, anise, cinnamon, cloves, ginger, mace, nutmeg, vanilla extract, other extracts   Copyright 2020  Academy of [employer] and [employer]etics. All rights reserved  Fluid Restricted Nutrition Therapy  You have been prescribed this diet because your condition affects how much fluid you can eat or drink. If your heart, liver, or kidneys  aren't working properly, you may not be able to effectively eliminate fluids from the body and this may cause swelling (edema) in the legs, arms, and/or stomach. Drink no more than _________ liters or ________ ounces or ________cups of fluid per day.   You don't need to stop eating or drinking the same fluids you normally would,  but you may need to eat or drink less than usual.   Your registered dietitian nutritionist will help you determine the correct amount of fluid to consume during the day Breakfast Include fluids taken with medications  Lunch Include fluids taken with medications  Dinner Include fluids taken with medications  Bedtime Snack Include fluids taken with medications     Tips What Are Fluids?  A fluid is anything that is liquid or anything that would melt if left at room temperature. You will need to count these foods and liquids--including any liquid used to take medication--as part of your daily fluid intake. Some examples are:  Alcohol (drink only with your doctor's permission)   Coffee, tea, and other hot beverages   Gelatin (Jell-O)   Gravy   Ice cream, sherbet, sorbet   Ice cubes, ice chips   Milk, liquid creamer   Nutritional supplements   Popsicles   Vegetable and fruit juices; fluid in canned fruit   Watermelon   Yogurt   Soft drinks, lemonade, limeade   Soups   Syrup How Do I Measure My Fluid Intake?  Record your fluid intake daily.   Tip: Every day, each time you eat or drink fluids, pour water in the same amount into an empty container that can hold the same amount of fluids you are allowed daily. This may help you keep track of how much fluid you are taking in throughout the day.   To accurately keep track of how much liquid you take in, measure the size of the cups, glasses, and bowls you use. If you eat soup, measure how much of it is liquid and how much is solid (such as noodles, vegetables, meat). Conversions for Measuring Fluid Intake  Milliliters (mL) Liters (L) Ounces (oz) Cups (c)  1000 1 32 4  1200 1.2 40 5  1500 1.5 50 6 1/4  1800 1.8 60 7 1/2  2000 2 67 8 1/3  Tips to Reduce Your Thirst  Chew gum or suck on hard candy.   Rinse or gargle with mouthwash. Do not swallow.   Ice chips or popsicles my help quench thirst, but this too needs to  be calculated into the total restriction. Melt ice chips or cubes first to figure out how much fluid they produce (for example, experiment with melting  cup ice chips or 2 ice cubes).   Add a lemon wedge to your water.   Limit how much salt you take in. A high salt intake might make you thirstier.   Don't eat or drink all your allowed liquids at once. Space your liquids out through the day.   Use small glasses and cups and sip slowly. If allowed, take your medications with fluids you eat or drink during a meal.   Fluid-Restricted Nutrition Therapy Sample 1-Day Menu  Breakfast 1 slice wheat toast  1 tablespoon peanut butter  1/2 cup yogurt (120 milliliters)  1/2 cup blueberries  1 cup milk (240 milliliters)   Lunch 3 ounces sliced [place]  2 slices whole wheat bread  1/2 cup lettuce for sandwich  2 slices tomato for sandwich  1 ounce reduced-fat,  reduced-sodium cheese  1/2 cup fresh carrot sticks  1 banana  1 cup unsweetened tea (240 milliliters)   Evening Meal 8 ounces soup (240 milliliters)  3 ounces salmon  1/2 cup quinoa  1 cup green beans  1 cup mixed greens salad  1 tablespoon olive oil  1 cup coffee (240 milliliters)  Evening Snack 1/2 cup sliced peaches  1/2 cup frozen yogurt (120 milliliters)  1 cup water (240 milliliters)  Copyright 2020  Academy of [employer] and Dietetics. All rights reserved

## 2020-10-13 NOTE — Progress Notes (Signed)
HD#5 SUBJECTIVE:  Overnight Events: Patient refused CPAP  Phillip Young lying in bed with CPAP on. States that he was able to get some sleep overnight and feels his breathing is much better. He is currently on CPAP. He feels much better overall today.   OBJECTIVE:  Vital Signs: Vitals:   10/12/20 2111 10/13/20 0043 10/13/20 0453 10/13/20 0702  BP:  (!) 114/58 133/90   Pulse:  79 82 90  Resp:  '16 14 19  '$ Temp: (P) 98.1 F (36.7 C) 98.3 F (36.8 C)    TempSrc: (P) Oral Oral  Oral  SpO2:  96% 98% 94%  Weight:    (!) 211.8 kg  Height:       Supplemental O2: Nasal Cannula SpO2: 94 % O2 Flow Rate (L/min): 4 L/min  Filed Weights   10/10/20 0300 10/12/20 0944 10/13/20 0702  Weight: (!) 212.4 kg (!) 210.5 kg (!) 211.8 kg     Intake/Output Summary (Last 24 hours) at 10/13/2020 0748 Last data filed at 10/12/2020 2148 Gross per 24 hour  Intake 1860 ml  Output 3180 ml  Net -1320 ml   Net IO Since Admission: -6,158.93 mL [10/13/20 0748]  Constitutional: Obese, uncomfortable appearing HENT: normocephalic atraumatic Eyes: conjunctiva non-erythematous Neck: supple, difficult to assess JVP in setting of body habitus.  R IJ central line in place, nontender nonerythematous. Cardiovascular: regular rate and rhythm, no m/r/g. No LE edema Pulmonary/Chest: Diminished breath sounds.  Saturating well on CPAP.  No obvious wheezing or rhonchi Abdominal: soft, non-tender, non-distended MSK: normal bulk and tone Neurological: alert & oriented x 3 Skin: warm and dry.  1+ lower extremity edema Psych: Normal thought process  Telemetry-normal sinus rhythm  Patient Lines/Drains/Airways Status    Active Line/Drains/Airways    Name Placement date Placement time Site Days   Peripheral IV 10/09/20 Right;Anterior Forearm 10/09/20  0523  Forearm  2   CVC Triple Lumen 10/10/20 Right Internal jugular 10/10/20  2135  - 1          Recent Labs    10/12/20 1631 10/12/20 2138 10/13/20 0704   GLUCAP 115* 111* 154*     Pertinent Labs: CBC Latest Ref Rng & Units 10/13/2020 10/12/2020 10/10/2020  WBC 4.0 - 10.5 K/uL 8.0 7.1 6.9  Hemoglobin 13.0 - 17.0 g/dL 12.7(L) 13.2 13.4  Hematocrit 39.0 - 52.0 % 38.2(L) 40.8 41.4  Platelets 150 - 400 K/uL 214 214 253    CMP Latest Ref Rng & Units 10/13/2020 10/12/2020 10/11/2020  Glucose 70 - 99 mg/dL 110(H) 116(H) 127(H)  BUN 6 - 20 mg/dL '10 12 15  '$ Creatinine 0.61 - 1.24 mg/dL 0.99 1.14 1.31(H)  Sodium 135 - 145 mmol/L 133(L) 131(L) 131(L)  Potassium 3.5 - 5.1 mmol/L 3.6 4.0 4.0  Chloride 98 - 111 mmol/L 93(L) 93(L) 94(L)  CO2 22 - 32 mmol/L 34(H) 30 29  Calcium 8.9 - 10.3 mg/dL 8.3(L) 8.5(L) 8.6(L)  Total Protein 6.5 - 8.1 g/dL - 7.9 7.7  Total Bilirubin 0.3 - 1.2 mg/dL - 0.6 1.0  Alkaline Phos 38 - 126 U/L - 46 53  AST 15 - 41 U/L - 60(H) 58(H)  ALT 0 - 44 U/L - 57(H) 50(H)   CVP: 15 Co-ox: 74% FENa: 0.5%  Imaging: No results found.  ASSESSMENT/PLAN:   Active Problems:   CAP (community acquired pneumonia)   Patient Summary: Phillip Young a 35 y.o.with pertinent PMH of biventricular heart failure, diabetes, HLD, OSA, recent Covid infectionwho presented withdry cough, dyspnea on  exertionand willadmit forPMV pneumonia complicated by CHF exacerbation.   Pneumometavirus pneumonia Acute hypoxic respiratory failure Patient endorses improvement of shortness of breath.  Has remained afebrile.  Will wean oxygen today, O2 goal as per below. - Continue conservative treatment. - Continue Tylenol for fever  - Continue Robitussin, Tessalon for cough - Continue lidocaine patch for back pain secondary to cough - Keep SPO2 around 90 to 93% wean as tolerated requirement improving - VBG prn if patient develops acute respiratory distress  Biventricular heart failure(EF 20-25%, 05/2020) Central venous catheter in place.  CO-OX of 74%, improving.  CVP of 15.. Yesterday CVP 12-13.  Heart failure team on board, appreciate their  recommendations.  Patient continues to have good urinary output, but uncertain if patient is following fluid restrictions. -We will continue guideline directed therapy with Entresto 49-51 mg twice daily, spironolactone 25 mg daily, dapagliflozin 10 mg daily, torsemide 60 mg daily.   -Strict ins and outs-1500 mL fluid restriction -Fluid and salt restricted diet -Daily weights, patient has not been weighed in 2 days. -Titrate medications as needed -Trend CVP and Co-ox.  Atrial fibrillation w/ RVR: Converted to normal sinus rhythm overnight.  -Continue digoxin, amiodarone -Continue heparin  OSA Patient appears to wear CPAP during daytime.  He does work third shift as a Recruitment consultant, and states that he has a difficult time taking diuretics while at work.  This then leads to him taking them in the evening and with the frequent back and forth to the bathroom, difficult for him to remain adherent to his CPAP. -CPAP prn  Best Practice: Diet: HH/Fluid restriction  IVF: PO intake (fluid restriction of 1500 cc) VTE: Heparin gtt Code: Full PT/OT: Pending DISPO: Pending  Anticipated discharge in 1-2 days pending clinical improvement.  Sanjuana Letters DO  Internal Medicine Resident PGY-1 [place]  Pager: [phone]  Please contact the on call pager after 5 pm and on weekends at [phone].

## 2020-10-13 NOTE — Progress Notes (Addendum)
Patient ID: Phillip Young, male   DOB: 1986/04/15, 35 y.o.   MRN: [id number]    Advanced Heart Failure Rounding Note   Subjective:    Sleepy but will awaken.  Still short of breath and feels like he is retaining fluid.  CVP 18, co-ox 74%.   He is off antibiotics.    Objective:   Weight Range:  Vital Signs:   Temp:  [97.6 F (36.4 C)-98.3 F (36.8 C)] 98 F (36.7 C) (03/21 0805) Pulse Rate:  [79-90] 81 (03/21 0847) Resp:  [14-20] 19 (03/21 0805) BP: (113-133)/(58-90) 118/78 (03/21 0805) SpO2:  [92 %-98 %] 98 % (03/21 0805) Weight:  [211.8 kg] 211.8 kg (03/21 0702) Last BM Date: 10/12/20  Weight change: Filed Weights   10/10/20 0300 10/12/20 0944 10/13/20 0702  Weight: (!) 212.4 kg (!) 210.5 kg (!) 211.8 kg    Intake/Output:   Intake/Output Summary (Last 24 hours) at 10/13/2020 1014 Last data filed at 10/12/2020 2148 Gross per 24 hour  Intake 1380 ml  Output 3180 ml  Net -1800 ml     Physical Exam: General: NAD Neck: JVP difficult with thick neck, no thyromegaly or thyroid nodule.  Lungs: Bilateral wheezing CV: Nonpalpable PMI.  Heart regular S1/S2, no S3/S4, no murmur.  1+ edema 1/2 to knees bilaterally.   Abdomen: Soft, nontender, no hepatosplenomegaly, no distention.  Skin: Intact without lesions or rashes.  Neurologic: Alert and oriented x 3.  Psych: Normal affect. Extremities: No clubbing or cyanosis.  HEENT: Normal.   Telemetry:  NSR 60s Personally reviewed   Labs: Basic Metabolic Panel: Recent Labs  Lab 10/10/20 0217 10/10/20 1336 10/11/20 0500 10/12/20 0739 10/13/20 0447 10/13/20 0700  NA 133* 131* 131* 131* 133*  --   K 4.0 3.8 4.0 4.0 3.6  --   CL 93* 96* 94* 93* 93*  --   CO2 '31 26 29 30 '$ 34*  --   GLUCOSE 131* 130* 127* 116* 110*  --   BUN '12 15 15 12 10  '$ --   CREATININE 1.19 1.30* 1.31* 1.14 0.99  --   CALCIUM 9.0 8.8* 8.6* 8.5* 8.3*  --   MG  --  1.7 1.8  --   --  2.2  PHOS  --  4.1 4.1  --   --   --     Liver Function  Tests: Recent Labs  Lab 10/08/20 1115 10/10/20 1336 10/11/20 0500 10/12/20 0739  AST 44*  --  58* 60*  ALT 36  --  50* 57*  ALKPHOS 70  --  53 46  BILITOT 1.3*  --  1.0 0.6  PROT 8.1  --  7.7 7.9  ALBUMIN 3.3* 3.1* 3.0* 2.9*   No results for input(s): LIPASE, AMYLASE in the last 168 hours. No results for input(s): AMMONIA in the last 168 hours.  CBC: Recent Labs  Lab 10/08/20 1031 10/09/20 0116 10/10/20 0217 10/12/20 0452 10/13/20 0447  WBC 7.2 6.1 6.9 7.1 8.0  HGB 14.0 13.3 13.4 13.2 12.7*  HCT 41.8 41.1 41.4 40.8 38.2*  MCV 92.7 93.6 93.9 92.5 92.5  PLT 265 252 253 214 214    Cardiac Enzymes: No results for input(s): CKTOTAL, CKMB, CKMBINDEX, TROPONINI in the last 168 hours.  BNP: BNP (last 3 results) Recent Labs    10/02/20 1015 10/08/20 1115 10/10/20 1336  BNP 153.4* 93.2 126.9*    ProBNP (last 3 results) No results for input(s): PROBNP in the last 8760 hours.    Other results:  Imaging: No results found.   Medications:     Scheduled Medications: . acetaminophen  650 mg Oral Q6H   Or  . acetaminophen  650 mg Rectal Q6H  . Chlorhexidine Gluconate Cloth  6 each Topical Daily  . dapagliflozin propanediol  10 mg Oral Daily  . digoxin  0.125 mg Oral Daily  . furosemide  80 mg Intravenous BID  . guaiFENesin  5 mL Oral Q4H  . lidocaine  1 patch Transdermal Q24H  . lidocaine (PF)  5 mL Intradermal Once  . potassium chloride  40 mEq Oral Once  . sacubitril-valsartan  1 tablet Oral BID  . spironolactone  25 mg Oral Daily    Infusions: . amiodarone 30 mg/hr (10/11/20 0500)  . heparin 2,600 Units/hr (10/12/20 1514)    PRN Medications: benzonatate, levalbuterol, phenol   Assessment/Plan:    1. Acute hypoxic respiratory failure: Due to PNA. CXR and Chest CT w/ bilateral lower lobe bronchopneumonia worse on the right. COVID negative. Respiratory panel + for Metapneumovirus.   - Management per primary, now off abx.  2. Atrial Fibrillation  w/ RVR: New diagnosis. Suspect triggered by acute viral infection (PNA). Also in setting of structural heart diease and underlying OSA w/ poor compliance w/ CPAP.  Converted to SR on IV amio. Will continue.  - Keep K > 4.0 and Mg > 2.0  - continue digoxin 0.125 - ivabradine stopped  - Would transition to Eliquis at this point.  - Can transition to po amiodarone.  3. Acute on Chronic Biventricular Heart Failure: Known cardiomyopathy since 2019. Echo 02/27/2020 with EF <20%, moderate LV dilation, severely decreased RV function with severe RV dilation, severe biatrial enlargement, mild MR. No history of ETOH/drugs. No FH of cardiomyopathy (mother with [place]). 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 Suspect he is too large for cardiac MRI. Narrow QRS on ECG so not CRT candidate. RHC/LHC on 03/04/20 with no CAD (nonischemic cardiomyopathy), elevated left and right heart filling pressures, and low CI at 2.1. 05/2020 Echo showed EF 20-25%. RV was not well visualized. Saw EP but needs to lose weight for ICD. BNP only 92 but may be falsely low in setting of obesity.  Initially received IV Lasix.  Now back on PO torsemide. CVP 18 today, needs to go back on IV diuretics.  - Stop torsemide, restart Lasix 80 mg IV bid. Replace K.  - Continue Entresto 49-51 bid - Continue Spiro 25 mg daily  - Continue Farxiga 10 mg daily  - Continue digoxin 0.125 mg daily.  - Off ivabradine with new Afib  - Strict I/Os and daily wts  4. OSA: CPAP QHS  5. Super Morbid Obesity: Body mass index is 63.5 kg/m. - wt loss advised   Length of Stay: 5  [name] MD 10/13/2020, 10:14 AM  Advanced Heart Failure Team Pager [phone] (M-F; 7a - 4p)  Please contact [facility] for night-coverage after hours (4p -7a ) and weekends on [email or web address]

## 2020-10-13 NOTE — Progress Notes (Signed)
Patient with O2 saturation of 70% at times while sleeping and apneic periods of 20 seconds or more. Patient and cardiologist were made aware. Patient refused Cpap machine for now. Patient educated on the possible consequences of noncompliance and still chose to refuse. Will continue to monitor.   -Donnelly Angelica, RN

## 2020-10-13 NOTE — Progress Notes (Addendum)
[place] for IV heparin Indication: atrial fibrillation  Allergies  Allergen Reactions  . Coreg [Carvedilol] Shortness Of Breath and Diarrhea    Wheezing     Patient Measurements: Height: 6' (182.9 cm) Weight: (!) 211.8 kg (466 lb 14.4 oz) IBW/kg (Calculated) : 77.6 Heparin Dosing Weight: 131 kg  Vital Signs: Temp: 98.3 F (36.8 C) (03/21 0043) Temp Source: Oral (03/21 0702) BP: 133/90 (03/21 0453) Pulse Rate: 81 (03/21 0847)  Labs: Recent Labs    10/11/20 0500 10/11/20 0536 10/12/20 0433 10/12/20 0452 10/12/20 0739 10/12/20 0928 10/13/20 0447  HGB  --   --   --  13.2  --   --  12.7*  HCT  --   --   --  40.8  --   --  38.2*  PLT  --   --   --  214  --   --  214  HEPARINUNFRC  --    < > 0.49  --   --  0.36 0.50  CREATININE 1.31*  --   --   --  1.14  --  0.99   < > = values in this interval not displayed.    Estimated Creatinine Clearance: 193.4 mL/min (by C-G formula based on SCr of 0.99 mg/dL).  Assessment: 35 yo male with new onset afib.  Had been receiving 100 mg of lovenox (0.5 mg/kg) for DVT prophylaxis. Pharmacy asked to begin anticoagulation with IV heparin.    HL remains therapeutic (0.5) on heparin infusion at 2600 units/hr. CBC stable. No s/sx of bleeding or infusion issues.    Goal of Therapy:  Heparin level 0.3-0.7 units/ml Monitor platelets by anticoagulation protocol: Yes   Plan:  Continue IV heparin at 2,600 units/hr Monitor daily HL, CBC and s/sx bleeding F/u transition to PO anticoagulation  Antonietta Jewel, PharmD, [place] Pharmacist  Phone: [phone] 10/13/2020 9:26 AM  Please check AMION for all [place] phone numbers After 10:00 PM, call [facility] to transition from heparin infusion to apixaban. Given age<80, wt> 60 kg, and Scr<1.5, qualifies for full dose apixaban 5 mg BID. Monitor for s/sx of bleeding.  Antonietta Jewel, PharmD, [place] Clinical Pharmacist

## 2020-10-14 DIAGNOSIS — I5023 Acute on chronic systolic (congestive) heart failure: Secondary | ICD-10-CM

## 2020-10-14 LAB — COOXEMETRY PANEL
Carboxyhemoglobin: 1.2 % (ref 0.5–1.5)
Methemoglobin: 0.8 % (ref 0.0–1.5)
O2 Saturation: 50.6 %
Total hemoglobin: 13 g/dL (ref 12.0–16.0)

## 2020-10-14 LAB — BASIC METABOLIC PANEL
Anion gap: 7 (ref 5–15)
BUN: 10 mg/dL (ref 6–20)
CO2: 33 mmol/L — ABNORMAL HIGH (ref 22–32)
Calcium: 8.8 mg/dL — ABNORMAL LOW (ref 8.9–10.3)
Chloride: 93 mmol/L — ABNORMAL LOW (ref 98–111)
Creatinine, Ser: 0.98 mg/dL (ref 0.61–1.24)
GFR, Estimated: >60 mL/min (ref >60)
Glucose, Bld: 105 mg/dL — ABNORMAL HIGH (ref 70–99)
Potassium: 3.9 mmol/L (ref 3.5–5.1)
Sodium: 133 mmol/L — ABNORMAL LOW (ref 135–145)

## 2020-10-14 LAB — CBC
HCT: 39.7 % (ref 39.0–52.0)
Hemoglobin: 12.9 g/dL — ABNORMAL LOW (ref 13.0–17.0)
MCH: 30.2 pg (ref 26.0–34.0)
MCHC: 32.5 g/dL (ref 30.0–36.0)
MCV: 93 fL (ref 80.0–100.0)
Platelets: 221 10*3/uL (ref 150–400)
RBC: 4.27 MIL/uL (ref 4.22–5.81)
RDW: 13.6 % (ref 11.5–15.5)
WBC: 6.8 10*3/uL (ref 4.0–10.5)
nRBC: 0 % (ref 0.0–0.2)

## 2020-10-14 LAB — GLUCOSE, CAPILLARY
Glucose-Capillary: 100 mg/dL — ABNORMAL HIGH (ref 70–99)
Glucose-Capillary: 135 mg/dL — ABNORMAL HIGH (ref 70–99)

## 2020-10-14 LAB — PHOSPHORUS: Phosphorus: 4.4 mg/dL (ref 2.5–4.6)

## 2020-10-14 LAB — MAGNESIUM: Magnesium: 2 mg/dL (ref 1.7–2.4)

## 2020-10-14 MED ORDER — METOLAZONE 5 MG PO TABS
2.5000 mg | ORAL_TABLET | Freq: Once | ORAL | Status: AC
  Administered 2020-10-14: 2.5 mg via ORAL
  Filled 2020-10-14: qty 1

## 2020-10-14 MED ORDER — POTASSIUM CHLORIDE CRYS ER 20 MEQ PO TBCR
40.0000 meq | EXTENDED_RELEASE_TABLET | Freq: Two times a day (BID) | ORAL | Status: DC
  Administered 2020-10-14 (×2): 40 meq via ORAL
  Filled 2020-10-14 (×2): qty 2

## 2020-10-14 MED ORDER — POTASSIUM CHLORIDE CRYS ER 20 MEQ PO TBCR
40.0000 meq | EXTENDED_RELEASE_TABLET | Freq: Once | ORAL | Status: AC
  Administered 2020-10-14: 40 meq via ORAL
  Filled 2020-10-14: qty 2

## 2020-10-14 MED ORDER — POTASSIUM CHLORIDE CRYS ER 20 MEQ PO TBCR: 40.0000 meq | EXTENDED_RELEASE_TABLET | Freq: Three times a day (TID) | ORAL | Status: DC

## 2020-10-14 MED ORDER — POTASSIUM CHLORIDE CRYS ER 20 MEQ PO TBCR: 30.0000 meq | EXTENDED_RELEASE_TABLET | Freq: Once | ORAL | Status: DC

## 2020-10-14 NOTE — TOC Initial Note (Signed)
Transition of Care [facility]) - Initial Note  Heart Failure   Patient Details  Name: Phillip Young MRN: [id number] Date of Birth: 17-Oct-1985  [facility] CM/SW Contact:    [place], [place] Phone Number: 10/14/2020, 11:54 AM  Clinical Narrative:             CSW attempted to screen patient for SDOH however Phillip Young kept falling asleep and couldn't stay awake to speak with CSW. Phillip Young reported that he does not have a PCP and would like helping setting up an appointment. CSW will work on getting Phillip Young a PCP. CSW will try again at another time to screen Phillip Young for social needs.        Expected Discharge Plan: Home/Self Care Barriers to Discharge: Continued Medical Work up   Patient Goals and CMS Choice        Expected Discharge Plan and Services Expected Discharge Plan: Home/Self Care In-house Referral: Clinical Social Work Discharge Planning Services: CM Consult                                          Prior Living Arrangements/Services   Lives with:: Self,Parents,Relatives Patient language and need for interpreter reviewed:: Yes        Need for Family Participation in Patient Care: No (Comment) Care giver support system in place?: No (comment)   Criminal Activity/Legal Involvement Pertinent to Current Situation/Hospitalization: No - Comment as needed  Activities of Daily Living Home Assistive Devices/Equipment: None ADL Screening (condition at time of admission) Patient's cognitive ability adequate to safely complete daily activities?: Yes Is the patient deaf or have difficulty hearing?: No Does the patient have difficulty seeing, even when wearing glasses/contacts?: No Does the patient have difficulty concentrating, remembering, or making decisions?: No Patient able to express need for assistance with ADLs?: Yes Does the patient have difficulty dressing or bathing?: Yes Independently performs ADLs?: Yes (appropriate for  developmental age) Does the patient have difficulty walking or climbing stairs?: Yes Weakness of Legs: None Weakness of Arms/Hands: None  Permission Sought/Granted Permission sought to share information with : Case Manager                Emotional Assessment Appearance:: Appears stated age Attitude/Demeanor/Rapport: Lethargic Affect (typically observed): Quiet   Alcohol / Substance Use: Not Applicable Psych Involvement: No (comment)  Admission diagnosis:  CAP (community acquired pneumonia) [J18.9] Acute respiratory failure with hypoxia ([place]) [J96.01] Community acquired pneumonia of right middle lobe of lung [J18.9] Patient Active Problem List   Diagnosis Date Noted  . CAP (community acquired pneumonia) 10/08/2020  . Acute on chronic systolic congestive heart failure ([place])   . Hypokalemia   . Biventricular heart failure ([place])   . Chronic cough   . Acute on chronic systolic (congestive) heart failure ([place]) 02/26/2020  . Morbid obesity with body mass index (BMI) of 60.0 to 69.9 in adult ([place]) 02/26/2020  . Essential hypertension 02/26/2020  . GERD without esophagitis 02/26/2020  . Diuretic-induced hypokalemia 02/26/2020  . OSA (obstructive sleep apnea) 02/26/2020  . Hidradenitis suppurativa 02/26/2020  . Prediabetes 02/26/2020   PCP:  Patient, No Pcp Per Pharmacy:   [id number] - [place], [place] - [address] AT [place] [address] [place] [address] Phone: [phone] Fax: [phone]  [facility] -  [place], [place] - [address] [place] [address] Phone: [phone] Fax: [phone]     Social Determinants of Health ([place]) Interventions    Readmission Risk Interventions No flowsheet data found.  Cortlin Polo, MSW, [place] Heart Failure Social Worker

## 2020-10-14 NOTE — Progress Notes (Signed)
Nutrition Education Note  RD consulted for nutrition education regarding new onset CHF.  RD provided "Low Sodium Nutrition Therapy" handout from the Academy of Nutrition and Dietetics. Reviewed patient's dietary recall. Provided examples on ways to decrease sodium intake in diet. Discouraged intake of processed foods and use of salt shaker. Encouraged fresh fruits and vegetables as well as whole grain sources of carbohydrates to maximize fiber intake.   RD discussed why it is important for patient to adhere to diet recommendations, and emphasized the role of fluids, foods to avoid, and importance of weighing self daily. Teach back method used.  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 Typically eats breakfast, dinner, and skips lunch to avoid being sleepy while driving. States breakfast consists of egg sandwich and dinner consists of a meat (cooked in air fryer) with vegetable, and grain. He seems to be aware of foods that are higher in sodium, tries to avoid using the salt shaker, and uses alternative seasoning such as Mrs. Dash. States he "fell back into old ways" when he was diagnosed with COVID a few months. RD dicussed why it's important to follow these recommendations. Provided handout in AVS.   Expect fair compliance.  Current diet order is heart healthy, patient is consuming approximately 100% of meals at this time. Labs and medications reviewed. No further nutrition interventions warranted at this time. RD contact information provided. If additional nutrition issues arise, please re-consult RD.   Mariana Single RD, LDN Clinical Nutrition Pager listed in [place]

## 2020-10-14 NOTE — Progress Notes (Addendum)
Patient ID: Phillip Young, male   DOB: 1985-12-29, 35 y.o.   MRN: [id number]    Advanced Heart Failure Rounding Note   Subjective:    Yesterday IV lasix restarted. Negative 2.1 liters.   Feeling a little better.   Objective:   Weight Range:  Vital Signs:   Temp:  [97.3 F (36.3 C)-98.5 F (36.9 C)] 98.5 F (36.9 C) (03/22 0415) Pulse Rate:  [81-88] 84 (03/22 0415) Resp:  [18-20] 20 (03/22 0415) BP: (108-143)/(69-100) 108/73 (03/22 0415) SpO2:  [93 %-100 %] 93 % (03/22 0415) Weight:  [212.6 kg] 212.6 kg (03/22 0415) Last BM Date: 10/12/20  Weight change: Filed Weights   10/12/20 0944 10/13/20 0702 10/14/20 0415  Weight: (!) 210.5 kg (!) 211.8 kg (!) 212.6 kg    Intake/Output:   Intake/Output Summary (Last 24 hours) at 10/14/2020 0738 Last data filed at 10/14/2020 0239 Gross per 24 hour  Intake 1480 ml  Output 3677 ml  Net -2197 ml    CVP sitting on the side of the bed 14 Physical Exam: General:  Sitting on the side of the bed.  No resp difficulty HEENT: normal Neck: supple. JVP difficult to assess.  Carotids 2+ bilat; no bruits. No lymphadenopathy or thryomegaly appreciated. RIJ  Cor: PMI nondisplaced. Regular rate & rhythm. No rubs, gallops or murmurs. Lungs: clear Abdomen: obese, soft, nontender, nondistended. No hepatosplenomegaly. No bruits or masses. Good bowel sounds. Extremities: no cyanosis, clubbing, rash, R and LLE 1+ edema Neuro: alert & orientedx3, cranial nerves grossly intact. moves all 4 extremities w/o difficulty. Affect pleasant  Telemetry: NSR 60s had brief episode of bradycardia.    Labs: Basic Metabolic Panel: Recent Labs  Lab 10/10/20 0217 10/10/20 1336 10/11/20 0500 10/12/20 0739 10/13/20 0447 10/13/20 0700  NA 133* 131* 131* 131* 133*  --   K 4.0 3.8 4.0 4.0 3.6  --   CL 93* 96* 94* 93* 93*  --   CO2 '31 26 29 30 '$ 34*  --   GLUCOSE 131* 130* 127* 116* 110*  --   BUN '12 15 15 12 10  '$ --   CREATININE 1.19 1.30* 1.31* 1.14 0.99  --    CALCIUM 9.0 8.8* 8.6* 8.5* 8.3*  --   MG  --  1.7 1.8  --   --  2.2  PHOS  --  4.1 4.1  --   --   --     Liver Function Tests: Recent Labs  Lab 10/08/20 1115 10/10/20 1336 10/11/20 0500 10/12/20 0739  AST 44*  --  58* 60*  ALT 36  --  50* 57*  ALKPHOS 70  --  53 46  BILITOT 1.3*  --  1.0 0.6  PROT 8.1  --  7.7 7.9  ALBUMIN 3.3* 3.1* 3.0* 2.9*   No results for input(s): LIPASE, AMYLASE in the last 168 hours. No results for input(s): AMMONIA in the last 168 hours.  CBC: Recent Labs  Lab 10/08/20 1031 10/09/20 0116 10/10/20 0217 10/12/20 0452 10/13/20 0447  WBC 7.2 6.1 6.9 7.1 8.0  HGB 14.0 13.3 13.4 13.2 12.7*  HCT 41.8 41.1 41.4 40.8 38.2*  MCV 92.7 93.6 93.9 92.5 92.5  PLT 265 252 253 214 214    Cardiac Enzymes: No results for input(s): CKTOTAL, CKMB, CKMBINDEX, TROPONINI in the last 168 hours.  BNP: BNP (last 3 results) Recent Labs    10/02/20 1015 10/08/20 1115 10/10/20 1336  BNP 153.4* 93.2 126.9*    ProBNP (last 3 results) No results  for input(s): PROBNP in the last 8760 hours.    Other results:  Imaging: No results found.   Medications:     Scheduled Medications: . acetaminophen  650 mg Oral Q6H   Or  . acetaminophen  650 mg Rectal Q6H  . amiodarone  200 mg Oral BID  . apixaban  5 mg Oral BID  . Chlorhexidine Gluconate Cloth  6 each Topical Daily  . dapagliflozin propanediol  10 mg Oral Daily  . digoxin  0.125 mg Oral Daily  . furosemide  80 mg Intravenous BID  . guaiFENesin  5 mL Oral Q4H  . lidocaine  1 patch Transdermal Q24H  . lidocaine (PF)  5 mL Intradermal Once  . sacubitril-valsartan  1 tablet Oral BID  . spironolactone  25 mg Oral Daily    Infusions:   PRN Medications: benzonatate, levalbuterol, phenol   Assessment/Plan:    1. Acute hypoxic respiratory failure: Due to PNA. CXR and Chest CT w/ bilateral lower lobe bronchopneumonia worse on the right. COVID negative. Respiratory panel + for Metapneumovirus.   -  Management per primary, now off abx.  2. Atrial Fibrillation w/ RVR: New diagnosis. Suspect triggered by acute viral infection (PNA). Also in setting of structural heart diease and underlying OSA w/ poor compliance w/ CPAP.  Converted to SR on IV amio.  - Keep K > 4.0 and Mg > 2.0  - Maintaining NSR.  - continue digoxin 0.125 - ivabradine stopped  - Switched to eliquis. .  - Continue amio 200 mg twice a day .  3. Acute on Chronic Biventricular Heart Failure: Known cardiomyopathy since 2019. Echo 02/27/2020 with EF <20%, moderate LV dilation, severely decreased RV function with severe RV dilation, severe biatrial enlargement, mild MR. No history of ETOH/drugs. No FH of cardiomyopathy (mother with [place]). 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 Suspect he is too large for cardiac MRI. Narrow QRS on ECG so not CRT candidate. RHC/LHC on 03/04/20 with no CAD (nonischemic cardiomyopathy), elevated left and right heart filling pressures, and low CI at 2.1. 05/2020 Echo showed EF 20-25%. RV was not well visualized. Saw EP but needs to lose weight for ICD. BNP only 92 but may be falsely low in setting of obesity.  Initially received IV Lasix and was switched to torsemide but CVP went back up. Now on IV lasix.  - CVP 13-14. Continue IV lasix 80 mg twice a day and give 2.5 mg metolazone.  If poor response can switch to lasix drip.  - Continue Entresto 49-51 bid - Continue Spiro 25 mg daily  - Continue Farxiga 10 mg daily  - Continue digoxin 0.125 mg daily.  - Off ivabradine with new Afib  4. OSA: CPAP QHS  5. Super Morbid Obesity: Body mass index is 63.58 kg/m. - wt loss advised  - Consult dietitian   Labs pending.   Consult cardiac rehab.   Length of Stay: [place] NP-C  10/14/2020, 7:38 AM  Advanced Heart Failure Team Pager [phone] (M-F; 7a - 5p)  Please contact [facility] for night-coverage after hours (5p -7a ) and weekends on  [email or web address]  Patient seen with NP, agree with the above note.   Good diuresis yesterday but CVP still 14.  He still feels "swollen" with some dyspnea.   General: NAD Neck: Thick, JVP difficult, no thyromegaly or thyroid nodule.  Lungs: Clear to auscultation bilaterally with normal respiratory effort. CV: Nonpalpable PMI.  Heart regular S1/S2, no  S3/S4, no murmur.  1+ edema 1/2 to knees. Abdomen: Soft, nontender, no hepatosplenomegaly, no distention.  Skin: Intact without lesions or rashes.  Neurologic: Alert and oriented x 3.  Psych: Normal affect. Extremities: No clubbing or cyanosis.  HEENT: Normal.   Still volume overloaded with CVP 14.  Agree with Lasix 80 mg IV bid today and will give a dose of metolazone 2.5 x 1.   Still pending labs, replace K as needed.   [name] 10/14/2020 8:28 AM

## 2020-10-14 NOTE — Progress Notes (Signed)
Physical Therapy Treatment Patient Details Name: Phillip Young MRN: [id number] DOB: [birth year] Today's Date: 10/14/2020    History of Present Illness Patient is a 35 y/o male who presents with SOB, cough, fevers 10/08/20. CXR- RLL opacity. Admitted with pneumometavirus of the lower respiratory tract and respiratory failure. PMH includes OSA, HTN, Biventricular congestive heart failure.    PT Comments    Pt received in standard chair in room, sitting forward in chair and c/o discomfort due to chair too small for him to sit comfortably in. Pt assisted to transfer from chair>EOB>bariatric recliner chair with Supervision for line mgmt/safety and pt denies dizziness during mobility (per RN he had possible near-syncopal episode earlier) and no loss of balance. Reviewed seated BLE therex and encouraged pt to perform t/o day as currently he defers to attempt. Pt continues to benefit from PT services to progress toward functional mobility goals. Pt could benefit from working with mobility tech as well if appropriate per RN/MD. Continue to recommend home, pending progress may need supplemental O2.  Follow Up Recommendations  No PT follow up;Supervision - Intermittent     Equipment Recommendations  None recommended by PT (may need supplemental O2, will continue to assess)    Recommendations for Other Services       Precautions / Restrictions Precautions Precautions: Other (comment) Precaution Comments: watch 02 Restrictions Weight Bearing Restrictions: No    Mobility  Bed Mobility               General bed mobility comments: sitting in chair pre/post session    Transfers Overall transfer level: Needs assistance Equipment used: None Transfers: Sit to/from Stand Sit to Stand: Supervision         General transfer comment: Stood from Phillip Young, no LOB and Supervision for line mgmt  Ambulation/Gait Ambulation/Gait assistance: Supervision Gait Distance (Feet): 10 Feet Assistive  device: None Gait Pattern/deviations: Step-through pattern;Decreased stride length;Wide base of support;Decreased step length - right;Decreased step length - left     General Gait Details: Wide BOS 2/2 body habitus, minimal distance as pt reporting increased fatigue after earlier session and poor sleep previous evening   Stairs             Wheelchair Mobility    Modified Rankin (Stroke Patients Only)       Balance Overall balance assessment: Needs assistance Sitting-balance support: Feet supported;No upper extremity supported Sitting balance-Leahy Scale: Good     Standing balance support: During functional activity Standing balance-Leahy Scale: Fair Standing balance comment: no LOB, supervision for safety                            Cognition Arousal/Alertness: Awake/alert Behavior During Therapy: WFL for tasks assessed/performed Overall Cognitive Status: Within Functional Limits for tasks assessed                                 General Comments: cooperative, frustrated with his health issues as he wants to be able to help his mother at home (she also has mobility issues)      Exercises General Exercises - Lower Extremity Ankle Circles/Pumps: AROM;Both;10 reps;Seated Other Exercises Other Exercises: reviewed seated therex, encouraged pt to perform TID    General Comments General comments (skin integrity, edema, etc.): SpO2 WNL on 2L [place]; limited mobility due to pt fatigue      Pertinent Vitals/Pain Pain Assessment: Faces Faces Pain Scale: Hurts  little more Pain Location: LBP Pain Descriptors / Indicators: Aching Pain Intervention(s): Monitored during session;Repositioned    Home Living                      Prior Function            PT Goals (current goals can now be found in the care plan section) Acute Rehab PT Goals Patient Stated Goal: "to get back to normal so I can help take care of my Mom." PT Goal Formulation:  With patient Time For Goal Achievement: 10/26/20 Potential to Achieve Goals: Good Progress towards PT goals: Progressing toward goals    Frequency    Min 3X/week      PT Plan Current plan remains appropriate    Co-evaluation              AM-PAC PT "6 Clicks" Mobility   Outcome Measure  Help needed turning from your back to your side while in a flat bed without using bedrails?: None Help needed moving from lying on your back to sitting on the side of a flat bed without using bedrails?: None Help needed moving to and from a bed to a chair (including a wheelchair)?: A Little Help needed standing up from a chair using your arms (e.g., wheelchair or bedside chair)?: A Little Help needed to walk in hospital room?: A Little Help needed climbing 3-5 steps with a railing? : A Little 6 Click Score: 20    End of Session Equipment Utilized During Treatment: Oxygen Activity Tolerance: Patient tolerated treatment well Patient left: with call bell/phone within reach;in chair Nurse Communication: Mobility status PT Visit Diagnosis: Other (comment);Difficulty in walking, not elsewhere classified (R26.2) (dyspnea)     Time: [id number] PT Time Calculation (min) (ACUTE ONLY): 9 min  Charges:  $Therapeutic Activity: 8-22 mins                     Aisia Correira P., PTA Acute Rehabilitation Services Pager: [phone] Office: [place] 10/14/2020, 4:05 PM

## 2020-10-14 NOTE — TOC Progression Note (Signed)
Transition of Care (TOC) - Progression Note  Heart Failure   Patient Details  Name: Phillip Young MRN: [id number] Date of Birth: Dec 22, 1985  Transition of Care [facility]) CM/SW [place], [place] Phone Number: 10/14/2020, 2:20 PM  Clinical Narrative:    CSW attempted again to speak with [name] for SDOH and to inform him of his scheduled PCP appointment. However [name] again couldn't stay awake to speak with CSW. CSW attempted to wake [name] up several times to discuss PCP appointment and [name] reported understanding and that he did have transportation but then fell back asleep. CSW left paperwork for [name] about his upcoming PCP appointment date, time, address and phone number.  TOC will continue to follow for discharge.    Expected Discharge Plan: Home/Self Care Barriers to Discharge: Continued Medical Work up  Expected Discharge Plan and Services Expected Discharge Plan: Home/Self Care In-house Referral: Clinical Social Work Discharge Planning Services: CM Consult                                           Social Determinants of Health (SDOH) Interventions    Readmission Risk Interventions Readmission Risk Prevention Plan 10/14/2020 10/14/2020  Transportation Screening Complete -  PCP or Specialist Appt within 5-7 Days Complete Complete  Some recent data might be hidden   Cortlin Polo, MSW, [place] Heart Failure Social Worker

## 2020-10-14 NOTE — Progress Notes (Signed)
Physical Therapy Treatment Patient Details Name: Phillip Young MRN: [id number] DOB: 1985-07-30 Today's Date: 10/14/2020    History of Present Illness Patient is a 35 y/o male who presents with SOB, cough, fevers 10/08/20. CXR- RLL opacity. Admitted with pneumometavirus of the lower respiratory tract and respiratory failure. PMH includes OSA, HTN, Biventricular congestive heart failure.    PT Comments    Pt received seated EOB, agreeable to therapy session and with good participation, motivated to progress activity tolerance. Pt remains limited due to DOE 2-3/4 during gait trial but able to perform greater than household distance ambulation task with no AD and self-monitors activity, taking standing breaks as needed. He continues to need cues for pursed-lip breathing technique and seems to breathe through mouth but SpO2 WNL on 4L O2 [place] with exertion, he does desat to 86% on 2L with exertion. Reviewed seated/standing HEP, pt would benefit from handout for HEP and stair training next session. Pt continues to benefit from PT services to progress toward functional mobility goals. Continue to recommend DC home, will continue to monitor O2 needs.  Follow Up Recommendations  No PT follow up;Supervision - Intermittent     Equipment Recommendations  None recommended by PT (may need supplemental O2, will continue to assess)    Recommendations for Other Services       Precautions / Restrictions Precautions Precautions: Other (comment) Precaution Comments: watch 02 Restrictions Weight Bearing Restrictions: No    Mobility  Bed Mobility               General bed mobility comments: sitting EOB upon PTA arrival    Transfers Overall transfer level: Needs assistance Equipment used: None Transfers: Sit to/from Stand Sit to Stand: Supervision         General transfer comment: Stood from EOB, increased time and Supervision due to mild posterior lean upon initially standing but pt able to  self-correct with bed frame support behind BLE  Ambulation/Gait Ambulation/Gait assistance: Supervision Gait Distance (Feet): 300 Feet (including x4 prolonged standing breaks (>1 min each)) Assistive device: None Gait Pattern/deviations: Step-through pattern;Decreased stride length;Wide base of support;Decreased step length - right;Decreased step length - left Gait velocity: grossly <0.6 m/s; variable   General Gait Details: Wide BOS 2/2 body habitus, x4 standing rest breaks leaning against wall for rest and pt at times reaching for hallway handrail but seems more due to fatigue/DOE than poor balance. SpO2 initially on 2L increased to 3L then 4L due to drop in Sp02 to 86% with activity. Able to maintain >88% the rest of ambulation on 4L 02 [place]. Cues for pursed lip breathing.   Stairs             Wheelchair Mobility    Modified Rankin (Stroke Patients Only)       Balance Overall balance assessment: Needs assistance Sitting-balance support: Feet supported;No upper extremity supported Sitting balance-Leahy Scale: Good     Standing balance support: During functional activity Standing balance-Leahy Scale: Fair Standing balance comment: tending to use hallway handrails occasionally, but static standing/dynamic standing tasks without overt LOB                            Cognition Arousal/Alertness: Awake/alert Behavior During Therapy: WFL for tasks assessed/performed Overall Cognitive Status: Within Functional Limits for tasks assessed  General Comments: Pt not wanting to be in the hospital and does what he wants to do but cooperative this date      Exercises General Exercises - Lower Extremity Heel Raises: AROM;Both;5 reps;Standing Other Exercises Other Exercises: reviewed seated therex and standing hamstring curls and standing hip flexion, encouraged pt to perform TID with hand/bed rails and supervision, will need  reinforcement    General Comments General comments (skin integrity, edema, etc.): SpO2 86-94% on 2-4L O2 [place] with exertion, increased when sats dropped below 88%; mostly on 4L O2 [place] during gait trial; HR 92-103 bpm during activity;      Pertinent Vitals/Pain Pain Assessment: Faces Faces Pain Scale: Hurts little more Pain Location: LBP Pain Descriptors / Indicators: Aching Pain Intervention(s): Monitored during session;Repositioned           PT Goals (current goals can now be found in the care plan section) Acute Rehab PT Goals Patient Stated Goal: "to get back to normal" PT Goal Formulation: With patient Time For Goal Achievement: 10/26/20 Potential to Achieve Goals: Good Progress towards PT goals: Progressing toward goals    Frequency    Min 3X/week      PT Plan Current plan remains appropriate       AM-PAC PT "6 Clicks" Mobility   Outcome Measure  Help needed turning from your back to your side while in a flat bed without using bedrails?: None Help needed moving from lying on your back to sitting on the side of a flat bed without using bedrails?: None Help needed moving to and from a bed to a chair (including a wheelchair)?: A Little Help needed standing up from a chair using your arms (e.g., wheelchair or bedside chair)?: A Little Help needed to walk in hospital room?: A Little Help needed climbing 3-5 steps with a railing? : A Little 6 Click Score: 20    End of Session Equipment Utilized During Treatment: Oxygen Activity Tolerance: Patient tolerated treatment well Patient left: in bed;with call bell/phone within reach;Other (comment) (seated EOB) Nurse Communication: Mobility status;Other (comment) (needed 4L O2 [place] to remain >90%) PT Visit Diagnosis: Other (comment);Difficulty in walking, not elsewhere classified (R26.2) (dyspnea)     Time: [id number] PT Time Calculation (min) (ACUTE ONLY): 19 min  Charges:  $Gait Training: 8-22 mins                      Calhoun Reichardt P., PTA Acute Rehabilitation Services Pager: [phone] Office: [place] 10/14/2020, 1:52 PM

## 2020-10-14 NOTE — Progress Notes (Signed)
[id number] Offered to walk. Pt coughing often and hard and not quite ready to walk again. Walked with PT earlier. Gave pt CHF booklet for him to read. Pt stated he does not think his scales are accurate for his weight. Will continue to follow. Graylon Good RN BSN 10/14/2020 3:07 PM

## 2020-10-14 NOTE — TOC Progression Note (Signed)
Transition of Care (TOC) - Progression Note Heart Failure   Patient Details  Name: Phillip Young MRN: [id number] Date of Birth: January 04, 1986  Transition of Care [facility]) CM/SW [place], [place] Phone Number: 10/14/2020, 12:28 PM  Clinical Narrative:    CSW scheduled [name] a PCP appointment at [facility] in [place], [place] for March 29th, 2022 Tuesday at 12:45pm. CSW will provide [name] with PCP appointment time, location, and phone number.  TOC will continue to follow through discharge.    Expected Discharge Plan: Home/Self Care Barriers to Discharge: Continued Medical Work up  Expected Discharge Plan and Services Expected Discharge Plan: Home/Self Care In-house Referral: Clinical Social Work Discharge Planning Services: CM Consult                                           Social Determinants of Health (SDOH) Interventions    Readmission Risk Interventions Readmission Risk Prevention Plan 10/14/2020  PCP or Specialist Appt within 5-7 Days Complete  Some recent data might be hidden   Cortlin Polo, MSW, [place] Heart Failure Social Worker

## 2020-10-14 NOTE — Progress Notes (Signed)
HD#6 SUBJECTIVE:  Overnight Events: No acute events overnight  Patient sitting up at edge of bed, breathing comfortably on 3 to 4 L nasal cannula.  He states that his breathing is much improved, but he feels as though if he had all the fluid off of him he be able to breathe better.  He has no other acute concerns at this time.  OBJECTIVE:  Vital Signs: Vitals:   10/13/20 1943 10/13/20 2300 10/14/20 0002 10/14/20 0415  BP: 114/69  (!) 120/91 108/73  Pulse: 81  85 84  Resp: '20 20 20 20  '$ Temp: 97.6 F (36.4 C) 97.8 F (36.6 C) 97.8 F (36.6 C) 98.5 F (36.9 C)  TempSrc: Oral Oral Oral Oral  SpO2: 100%  96% 93%  Weight:    (!) 212.6 kg  Height:       Supplemental O2: Nasal Cannula SpO2: 93 % O2 Flow Rate (L/min): 4 L/min  Filed Weights   10/12/20 0944 10/13/20 0702 10/14/20 0415  Weight: (!) 210.5 kg (!) 211.8 kg (!) 212.6 kg     Intake/Output Summary (Last 24 hours) at 10/14/2020 0621 Last data filed at 10/14/2020 0239 Gross per 24 hour  Intake 1480 ml  Output 3677 ml  Net -2197 ml   Net IO Since Admission: -8,355.93 mL [10/14/20 0621]  Constitutional: Obese HENT: normocephalic atraumatic Eyes: conjunctiva non-erythematous Neck: supple, difficult to assess JVP in setting of body habitus.  R IJ central line in place Cardiovascular: regular rate and rhythm, no m/r/g. No LE edema Pulmonary/Chest: Diminished breath sounds.  Saturating well 4 L no obvious wheezing or rhonchi Abdominal: soft, non-tender, non-distended MSK: normal bulk and tone Neurological: alert & oriented x 3 Skin: warm and dry.  1+-2+ lower extremity edema Psych: Normal thought process   Patient Lines/Drains/Airways Status    Active Line/Drains/Airways    Name Placement date Placement time Site Days   Peripheral IV 10/09/20 Right;Anterior Forearm 10/09/20  0523  Forearm  2   CVC Triple Lumen 10/10/20 Right Internal jugular 10/10/20  2135  - 1          Recent Labs    10/13/20 1736  10/13/20 2119 10/14/20 0614  GLUCAP 152* 153* 100*     Pertinent Labs: CBC Latest Ref Rng & Units 10/13/2020 10/12/2020 10/10/2020  WBC 4.0 - 10.5 K/uL 8.0 7.1 6.9  Hemoglobin 13.0 - 17.0 g/dL 12.7(L) 13.2 13.4  Hematocrit 39.0 - 52.0 % 38.2(L) 40.8 41.4  Platelets 150 - 400 K/uL 214 214 253    CMP Latest Ref Rng & Units 10/13/2020 10/12/2020 10/11/2020  Glucose 70 - 99 mg/dL 110(H) 116(H) 127(H)  BUN 6 - 20 mg/dL '10 12 15  '$ Creatinine 0.61 - 1.24 mg/dL 0.99 1.14 1.31(H)  Sodium 135 - 145 mmol/L 133(L) 131(L) 131(L)  Potassium 3.5 - 5.1 mmol/L 3.6 4.0 4.0  Chloride 98 - 111 mmol/L 93(L) 93(L) 94(L)  CO2 22 - 32 mmol/L 34(H) 30 29  Calcium 8.9 - 10.3 mg/dL 8.3(L) 8.5(L) 8.6(L)  Total Protein 6.5 - 8.1 g/dL - 7.9 7.7  Total Bilirubin 0.3 - 1.2 mg/dL - 0.6 1.0  Alkaline Phos 38 - 126 U/L - 46 53  AST 15 - 41 U/L - 60(H) 58(H)  ALT 0 - 44 U/L - 57(H) 50(H)   CVP: 15 Co-ox: 50%  Imaging: No results found.  ASSESSMENT/PLAN:   Active Problems:   CAP (community acquired pneumonia)   Patient Summary: Phillip Young a 35 y.o.with pertinent PMH of biventricular  heart failure, diabetes, HLD, OSA, recent Covid infectionwho presented withdry cough, dyspnea on exertionand willadmit forPMV pneumonia complicated by CHF exacerbation.   Pneumometavirus pneumonia Acute hypoxic respiratory failure Patient endorses improvement of shortness of breath and he is remained afebrile.  I believe his pneumonia is improving well.  We will continue to wean oxygen as tolerable. - Continue conservative treatment. - Continue Tylenol for fever  - Continue Robitussin, Tessalon for cough - Continue lidocaine patch for back pain secondary to cough - Keep SPO2 around 90 to 93% wean as tolerated requirement improving - VBG prn if patient develops acute respiratory distress  Biventricular heart failure(EF 20-25%, 05/2020) CVP of 14.  Still appears hypervolemic on exam.  Patient continues to have  good urinary output, but with increase in CVP cardiology recommend restarting IV Lasix.  Appreciate heart failure team's recommendations -We will continue guideline directed therapy with Entresto 49-51 mg twice daily, spironolactone 25 mg daily, dapagliflozin 10 mg daily -Start IV Lasix 80 mg twice daily today and metolazone 2.5 once -Monitor electrolytes -Strict ins and outs-1500 mL fluid restriction -Fluid and salt restricted diet -Daily weights -Titrate medications as needed -Trend CVP and Co-ox.  Atrial fibrillation w/ RVR-resolved Converted to normal sinus rhythm -Continue digoxin, amiodarone -Continue heparin  OSA Continue to encourage CPAP use  Best Practice: Diet: HH/Fluid restriction  IVF: PO intake (fluid restriction of 1500 cc) VTE: Heparin gtt Code: Full PT/OT: Pending DISPO: Pending  Anticipated discharge in 1-2 days pending clinical improvement.  Sanjuana Letters DO  Internal Medicine Resident PGY-1 [place]  Pager: [phone]  Please contact the on call pager after 5 pm and on weekends at [phone].

## 2020-10-15 LAB — CBC
HCT: 40.4 % (ref 39.0–52.0)
Hemoglobin: 13.5 g/dL (ref 13.0–17.0)
MCH: 30.5 pg (ref 26.0–34.0)
MCHC: 33.4 g/dL (ref 30.0–36.0)
MCV: 91.4 fL (ref 80.0–100.0)
Platelets: 256 10*3/uL (ref 150–400)
RBC: 4.42 MIL/uL (ref 4.22–5.81)
RDW: 13.7 % (ref 11.5–15.5)
WBC: 7.2 10*3/uL (ref 4.0–10.5)
nRBC: 0 % (ref 0.0–0.2)

## 2020-10-15 LAB — MAGNESIUM: Magnesium: 2.1 mg/dL (ref 1.7–2.4)

## 2020-10-15 LAB — BASIC METABOLIC PANEL
Anion gap: 8 (ref 5–15)
BUN: 16 mg/dL (ref 6–20)
CO2: 32 mmol/L (ref 22–32)
Calcium: 9.3 mg/dL (ref 8.9–10.3)
Chloride: 92 mmol/L — ABNORMAL LOW (ref 98–111)
Creatinine, Ser: 1 mg/dL (ref 0.61–1.24)
GFR, Estimated: >60 mL/min (ref >60)
Glucose, Bld: 110 mg/dL — ABNORMAL HIGH (ref 70–99)
Potassium: 4.2 mmol/L (ref 3.5–5.1)
Sodium: 132 mmol/L — ABNORMAL LOW (ref 135–145)

## 2020-10-15 LAB — GLUCOSE, CAPILLARY
Glucose-Capillary: 111 mg/dL — ABNORMAL HIGH (ref 70–99)
Glucose-Capillary: 116 mg/dL — ABNORMAL HIGH (ref 70–99)
Glucose-Capillary: 156 mg/dL — ABNORMAL HIGH (ref 70–99)
Glucose-Capillary: 119 mg/dL — ABNORMAL HIGH (ref 70–99)

## 2020-10-15 LAB — PHOSPHORUS: Phosphorus: 4.9 mg/dL — ABNORMAL HIGH (ref 2.5–4.6)

## 2020-10-15 LAB — COOXEMETRY PANEL
Carboxyhemoglobin: 1.3 % (ref 0.5–1.5)
Methemoglobin: 0.7 % (ref 0.0–1.5)
O2 Saturation: 65.6 %
Total hemoglobin: 13.8 g/dL (ref 12.0–16.0)

## 2020-10-15 MED ORDER — METOLAZONE 5 MG PO TABS
2.5000 mg | ORAL_TABLET | Freq: Once | ORAL | Status: AC
  Administered 2020-10-15: 2.5 mg via ORAL
  Filled 2020-10-15: qty 1

## 2020-10-15 MED ORDER — POTASSIUM CHLORIDE CRYS ER 20 MEQ PO TBCR
40.0000 meq | EXTENDED_RELEASE_TABLET | Freq: Two times a day (BID) | ORAL | Status: AC
  Administered 2020-10-15: 40 meq via ORAL
  Filled 2020-10-15 (×2): qty 2

## 2020-10-15 NOTE — Progress Notes (Signed)
Patient ID: Phillip Young, male   DOB: 06/16/1986, 35 y.o.   MRN: [id number] P   Advanced Heart Failure Rounding Note   Subjective:    Excellent response to IV Lasix + metolazone 2.5. Weight down 12 lbs. Co-ox 66%, CVP 14.   Still wearing oxygen during the day.   Objective:   Weight Range:  Vital Signs:   Temp:  [97.9 F (36.6 C)-98.5 F (36.9 C)] 98 F (36.7 C) (03/23 0821) Pulse Rate:  [64-87] 66 (03/23 0821) Resp:  [18-20] 19 (03/23 0821) BP: (105-138)/(59-91) 118/62 (03/23 0821) SpO2:  [90 %-97 %] 97 % (03/23 0821) Weight:  [207.2 kg] 207.2 kg (03/23 0431) Last BM Date: 10/12/20  Weight change: Filed Weights   10/13/20 0702 10/14/20 0415 10/15/20 0431  Weight: (!) 211.8 kg (!) 212.6 kg (!) 207.2 kg    Intake/Output:   Intake/Output Summary (Last 24 hours) at 10/15/2020 0849 Last data filed at 10/15/2020 0435 Gross per 24 hour  Intake 894 ml  Output 6840 ml  Net -5946 ml    General: NAD Neck: Thick, JVP difficult, no thyromegaly or thyroid nodule.  Lungs: Clear to auscultation bilaterally with normal respiratory effort. CV: Nondisplaced PMI.  Heart regular S1/S2, no S3/S4, no murmur.  1+ ankle edema.  Abdomen: Soft, nontender, no hepatosplenomegaly, no distention.  Skin: Intact without lesions or rashes.  Neurologic: Alert and oriented x 3.  Psych: Normal affect. Extremities: No clubbing or cyanosis.  HEENT: Normal.    Telemetry: NSR 80s, personally reviewed.    Labs: Basic Metabolic Panel: Recent Labs  Lab 10/10/20 1336 10/11/20 0500 10/12/20 0739 10/13/20 0447 10/13/20 0700 10/14/20 0736 10/15/20 0236  NA 131* 131* 131* 133*  --  133* 132*  K 3.8 4.0 4.0 3.6  --  3.9 4.2  CL 96* 94* 93* 93*  --  93* 92*  CO2 '26 29 30 '$ 34*  --  33* 32  GLUCOSE 130* 127* 116* 110*  --  105* 110*  BUN '15 15 12 10  '$ --  10 16  CREATININE 1.30* 1.31* 1.14 0.99  --  0.98 1.00  CALCIUM 8.8* 8.6* 8.5* 8.3*  --  8.8* 9.3  MG 1.7 1.8  --   --  2.2 2.0 2.1  PHOS 4.1  4.1  --   --   --  4.4 4.9*    Liver Function Tests: Recent Labs  Lab 10/08/20 1115 10/10/20 1336 10/11/20 0500 10/12/20 0739  AST 44*  --  58* 60*  ALT 36  --  50* 57*  ALKPHOS 70  --  53 46  BILITOT 1.3*  --  1.0 0.6  PROT 8.1  --  7.7 7.9  ALBUMIN 3.3* 3.1* 3.0* 2.9*   No results for input(s): LIPASE, AMYLASE in the last 168 hours. No results for input(s): AMMONIA in the last 168 hours.  CBC: Recent Labs  Lab 10/10/20 0217 10/12/20 0452 10/13/20 0447 10/14/20 0736 10/15/20 0236  WBC 6.9 7.1 8.0 6.8 7.2  HGB 13.4 13.2 12.7* 12.9* 13.5  HCT 41.4 40.8 38.2* 39.7 40.4  MCV 93.9 92.5 92.5 93.0 91.4  PLT 253 214 214 221 256    Cardiac Enzymes: No results for input(s): CKTOTAL, CKMB, CKMBINDEX, TROPONINI in the last 168 hours.  BNP: BNP (last 3 results) Recent Labs    10/02/20 1015 10/08/20 1115 10/10/20 1336  BNP 153.4* 93.2 126.9*    ProBNP (last 3 results) No results for input(s): PROBNP in the last 8760 hours.  Other results:  Imaging: No results found.   Medications:     Scheduled Medications: . acetaminophen  650 mg Oral Q6H   Or  . acetaminophen  650 mg Rectal Q6H  . amiodarone  200 mg Oral BID  . apixaban  5 mg Oral BID  . Chlorhexidine Gluconate Cloth  6 each Topical Daily  . dapagliflozin propanediol  10 mg Oral Daily  . digoxin  0.125 mg Oral Daily  . furosemide  80 mg Intravenous BID  . guaiFENesin  5 mL Oral Q4H  . lidocaine  1 patch Transdermal Q24H  . lidocaine (PF)  5 mL Intradermal Once  . potassium chloride  40 mEq Oral BID  . sacubitril-valsartan  1 tablet Oral BID  . spironolactone  25 mg Oral Daily    Infusions:   PRN Medications: benzonatate, levalbuterol, phenol   Assessment/Plan:    1. Acute hypoxic respiratory failure: Due to PNA. CXR and Chest CT w/ bilateral lower lobe bronchopneumonia worse on the right. COVID negative. Respiratory panel + for Metapneumovirus.   - Management per primary, now off abx.   2. Atrial Fibrillation w/ RVR: New diagnosis. Suspect triggered by acute viral infection (PNA). Also in setting of structural heart diease and underlying OSA w/ poor compliance w/ CPAP.  Converted to SR on IV amio.  Remains on NSR now on po amiodarone.  - Continue Eliquis.  - Continue amio 200 mg twice a day .  3. Acute on Chronic Biventricular Heart Failure: Known cardiomyopathy since 2019. Echo 02/27/2020 with EF <20%, moderate LV dilation, severely decreased RV function with severe RV dilation, severe biatrial enlargement, mild MR. No history of ETOH/drugs. No FH of cardiomyopathy (mother with [place]). 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 Suspect he is too large for cardiac MRI. Narrow QRS on ECG so not CRT candidate. RHC/LHC on 03/04/20 with no CAD (nonischemic cardiomyopathy), elevated left and right heart filling pressures, and low CI at 2.1. 05/2020 Echo showed EF 20-25%. RV was not well visualized. Saw EP but needs to lose weight for ICD. BNP only 92 but may be falsely low in setting of obesity.  Initially received IV Lasix and was switched to torsemide but CVP went back up. Now on IV lasix + metolazone with excellent response, weight down 12 lbs over the last day but still volume overloaded with CVP 14.   - Continue IV lasix 80 mg twice a day and give 2.5 mg metolazone again.  - Continue Entresto 49-51 bid - Continue Spiro 25 mg daily  - Continue Farxiga 10 mg daily  - Continue digoxin 0.125 mg daily.  - Off ivabradine with new Afib  4. OSA: CPAP QHS  5. Super Morbid Obesity: Body mass index is 61.94 kg/m. - wt loss advised  - Consult dietitian   [name] 10/15/2020 8:49 AM

## 2020-10-15 NOTE — Progress Notes (Signed)
Physical Therapy Treatment Patient Details Name: Phillip Young MRN: [id number] DOB: 09/27/1985 Today's Date: 10/15/2020    History of Present Illness Patient is a 35 y/o male who presents with SOB, cough, fevers 10/08/20. CXR- RLL opacity. Admitted with pneumometavirus of the lower respiratory tract and respiratory failure. PMH includes OSA, HTN, Biventricular congestive heart failure.    PT Comments    Pt received seated EOB, agreeable to therapy session and with good tolerance for gait and stair training. Pt reports improved activity tolerance and he needed at most 3L O2 [place] with exertion, an improvement from previous session. Pt Supervision for safety with functional mobility tasks. Pt continues to benefit from PT services to progress toward functional mobility goals. Anticipate pt to discharge home, will continue to monitor for post-acute needs, currently recommend no PT follow-up.  Follow Up Recommendations  No PT follow up;Supervision - Intermittent (may benefit from cardiopulmonary rehab in outpatient setting depending on progress acutely)     Equipment Recommendations  None recommended by PT (may need supplemental O2, will continue to assess)    Recommendations for Other Services       Precautions / Restrictions Precautions Precautions: Other (comment) Precaution Comments: watch 02 Restrictions Weight Bearing Restrictions: No    Mobility  Bed Mobility               General bed mobility comments: sitting EOB pre/post session    Transfers Overall transfer level: Needs assistance Equipment used: None Transfers: Sit to/from Stand Sit to Stand: Supervision         General transfer comment: Stood from EOB, no LOB and Supervision only for line mgmt  Ambulation/Gait Ambulation/Gait assistance: Supervision Gait Distance (Feet): 330 Feet Assistive device: None Gait Pattern/deviations: Step-through pattern;Decreased stride length;Wide base of support;Decreased step  length - right;Decreased step length - left Gait velocity: grossly <0.6 m/s; variable   General Gait Details: Wide BOS 2/2 body habitus, x2 standing breaks >1 minute with cues for pursed-lip breathing. Pt on 2L O2 [place] at rest and needs increased to 3L O2 [place] with exertion, after ~217f he did desat to 86%. On 3L maintained 89-94%.   Stairs Stairs: Yes Stairs assistance: Min guard Stair Management: One rail Right;Step to pattern;Forwards Number of Stairs: 5 General stair comments: he ascended/descended 1 step, then 2 steps, then 2 more steps with brief standing breaks between   Wheelchair Mobility    Modified Rankin (Stroke Patients Only)       Balance Overall balance assessment: Needs assistance Sitting-balance support: Feet supported;No upper extremity supported Sitting balance-Leahy Scale: Good     Standing balance support: During functional activity Standing balance-Leahy Scale: Fair Standing balance comment: no LOB, supervision for safety                            Cognition Arousal/Alertness: Awake/alert Behavior During Therapy: WFL for tasks assessed/performed Overall Cognitive Status: Within Functional Limits for tasks assessed                General Comments: cooperative, seems more optimistic this date and wanting to get stronger to help his Mom      Exercises      General Comments General comments (skin integrity, edema, etc.): HR 65 bpm resting and SpO2 98% at rest on 2L O2 [place]; desat to 86% with exertion but improves to >89% on 3L O2 [place]      Pertinent Vitals/Pain Pain Assessment: Faces Faces Pain Scale: Hurts little  more Pain Location: LBP, increases with time standing Pain Descriptors / Indicators: Aching Pain Intervention(s): Monitored during session;Repositioned           PT Goals (current goals can now be found in the care plan section) Acute Rehab PT Goals Patient Stated Goal: "to get back to normal so I can help take care of  my Mom." PT Goal Formulation: With patient Time For Goal Achievement: 10/26/20 Potential to Achieve Goals: Good Progress towards PT goals: Progressing toward goals    Frequency    Min 3X/week      PT Plan Current plan remains appropriate    Co-evaluation              AM-PAC PT "6 Clicks" Mobility   Outcome Measure  Help needed turning from your back to your side while in a flat bed without using bedrails?: None Help needed moving from lying on your back to sitting on the side of a flat bed without using bedrails?: None Help needed moving to and from a bed to a chair (including a wheelchair)?: A Little Help needed standing up from a chair using your arms (e.g., wheelchair or bedside chair)?: A Little Help needed to walk in hospital room?: A Little Help needed climbing 3-5 steps with a railing? : A Little 6 Click Score: 20    End of Session Equipment Utilized During Treatment: Oxygen Activity Tolerance: Patient tolerated treatment well Patient left: with call bell/phone within reach;in bed;Other (comment) (seated EOB) Nurse Communication: Mobility status PT Visit Diagnosis: Other (comment);Difficulty in walking, not elsewhere classified (R26.2) (dyspnea)     Time: [id number] PT Time Calculation (min) (ACUTE ONLY): 23 min  Charges:  $Gait Training: 23-37 mins                     Phillip Young P., PTA Acute Rehabilitation Services Pager: [phone] Office: [place] 10/15/2020, 5:36 PM

## 2020-10-15 NOTE — Progress Notes (Signed)
62 Talked with PT who is going to check with pt this afternoon. Will see how pt does and continue to follow. Graylon Good RN BSN 10/15/2020 2:48 PM

## 2020-10-15 NOTE — Progress Notes (Signed)
Pt repeatedly refusing CPAP placement throughout night 2/2 coughing. Has been getting available PRNs. Continues to cough up clear/tan thick phlegm.   Also repeatedly taking nasal cannula off, and sats will drift into 60s while asleep and reclined in chair. Instructed pt to leave cannula in and the risks of allowing sats to drop too low for too long. Will continue to monitor.  Jaymes Graff, RN

## 2020-10-15 NOTE — Progress Notes (Signed)
HD#7 SUBJECTIVE:  Overnight Events: No acute events overnight  Patient sitting up at edge of bed.  Comfortably on 4 L nasal cannula.  States he feels much better after being given IV diuretics yesterday and has had improvement of his breathing OBJECTIVE:  Vital Signs: Vitals:   10/14/20 1644 10/14/20 1944 10/14/20 2325 10/15/20 0431  BP: (!) 123/59 105/78 121/75 (!) 138/91  Pulse: 64 85 86 87  Resp: '18 20 20 20  '$ Temp: 98 F (36.7 C) 97.9 F (36.6 C) 97.9 F (36.6 C) 98.3 F (36.8 C)  TempSrc: Oral Oral Oral Oral  SpO2: 91% 90% 96% 93%  Weight:    (!) 207.2 kg  Height:       Supplemental O2: Nasal Cannula SpO2: 93 % O2 Flow Rate (L/min): 4 L/min  Filed Weights   10/13/20 0702 10/14/20 0415 10/15/20 0431  Weight: (!) 211.8 kg (!) 212.6 kg (!) 207.2 kg     Intake/Output Summary (Last 24 hours) at 10/15/2020 0742 Last data filed at 10/15/2020 0435 Gross per 24 hour  Intake 894 ml  Output 7440 ml  Net -6546 ml   Net IO Since Admission: -14,901.93 mL [10/15/20 0742]  Constitutional: Obese HENT: normocephalic atraumatic Eyes: conjunctiva non-erythematous Neck: supple, difficult to assess JVP in setting of body habitus.  R IJ central line in place Cardiovascular: regular rate and rhythm, no m/r/g.  Pulmonary/Chest: Diminished breath sounds.  Saturating well 4 L no obvious wheezing or rhonchi Abdominal: soft, non-tender, non-distended MSK: normal bulk and tone Neurological: alert & oriented x 3 Skin: warm and dry.  1+ lower extremity edema Psych: Normal thought process   Patient Lines/Drains/Airways Status    Active Line/Drains/Airways    Name Placement date Placement time Site Days   Peripheral IV 10/09/20 Right;Anterior Forearm 10/09/20  0523  Forearm  2   CVC Triple Lumen 10/10/20 Right Internal jugular 10/10/20  2135  -- 1          Recent Labs    10/14/20 0614 10/14/20 2119 10/15/20 0608  GLUCAP 100* 135* 111*     Pertinent Labs: CBC Latest Ref  Rng & Units 10/15/2020 10/14/2020 10/13/2020  WBC 4.0 - 10.5 K/uL 7.2 6.8 8.0  Hemoglobin 13.0 - 17.0 g/dL 13.5 12.9(L) 12.7(L)  Hematocrit 39.0 - 52.0 % 40.4 39.7 38.2(L)  Platelets 150 - 400 K/uL 256 221 214    CMP Latest Ref Rng & Units 10/15/2020 10/14/2020 10/13/2020  Glucose 70 - 99 mg/dL 110(H) 105(H) 110(H)  BUN 6 - 20 mg/dL '16 10 10  '$ Creatinine 0.61 - 1.24 mg/dL 1.00 0.98 0.99  Sodium 135 - 145 mmol/L 132(L) 133(L) 133(L)  Potassium 3.5 - 5.1 mmol/L 4.2 3.9 3.6  Chloride 98 - 111 mmol/L 92(L) 93(L) 93(L)  CO2 22 - 32 mmol/L 32 33(H) 34(H)  Calcium 8.9 - 10.3 mg/dL 9.3 8.8(L) 8.3(L)  Total Protein 6.5 - 8.1 g/dL - - -  Total Bilirubin 0.3 - 1.2 mg/dL - - -  Alkaline Phos 38 - 126 U/L - - -  AST 15 - 41 U/L - - -  ALT 0 - 44 U/L - - -   CVP: 14  Co-ox: 60%  Imaging: No results found.  ASSESSMENT/PLAN:   Active Problems:   CAP (community acquired pneumonia)   Patient Summary: Phillip Young a 35 y.o.with pertinent PMH of biventricular heart failure, diabetes, HLD, OSA, recent Covid infectionwho presented withdry cough, dyspnea on exertionand willadmit forPMV pneumonia complicated by CHF exacerbation.  Pneumometavirus pneumonia Acute hypoxic respiratory failure Pneumonia improving well.  Continue to wean oxygen as tolerable and continuous conservative treatment.  Patient continues to have cough during sleep, will request nurse to give Robitussin prior to bedtime and CPAP placement. - Continue conservative treatment. - Continue Tylenol for fever  - Continue Robitussin, Tessalon for cough - Continue lidocaine patch for back pain secondary to cough - Keep SPO2 around 90 to 93% wean as tolerated requirement improving - VBG prn if patient develops acute respiratory distress  Biventricular heart failure(EF 20-25%, 05/2020) CVP of 14.  Patient feels much improved since diuresis yesterday with Lasix and metolazone.  Weight down 12 pounds with adequate urinary  output. -We will continue guideline directed therapy with Entresto 49-51 mg twice daily, spironolactone 25 mg daily, dapagliflozin 10 mg daily -Continue IV Lasix 80 mg twice daily today and metolazone 2.5 once -Monitor electrolytes -Strict ins and outs-1500 mL fluid restriction -Fluid and salt restricted diet -Daily weights -Titrate medications as needed -Trend CVP and Co-ox.  Atrial fibrillation w/ RVR-resolved Converted to normal sinus rhythm -Continue digoxin, amiodarone -Continue heparin  OSA Continue to encourage CPAP use  Best Practice: Diet: HH/Fluid restriction  IVF: PO intake (fluid restriction of 1500 cc) VTE: Heparin gtt Code: Full PT/OT: No further recommendations DISPO: Pending  Anticipated discharge in 1-2 days pending clinical improvement.  Sanjuana Letters DO  Internal Medicine Resident PGY-1 [place]  Pager: [phone]  Please contact the on call pager after 5 pm and on weekends at [phone].

## 2020-10-16 LAB — BASIC METABOLIC PANEL
Anion gap: 6 (ref 5–15)
BUN: 19 mg/dL (ref 6–20)
CO2: 33 mmol/L — ABNORMAL HIGH (ref 22–32)
Calcium: 8.9 mg/dL (ref 8.9–10.3)
Chloride: 93 mmol/L — ABNORMAL LOW (ref 98–111)
Creatinine, Ser: 1.12 mg/dL (ref 0.61–1.24)
GFR, Estimated: >60 mL/min (ref >60)
Glucose, Bld: 102 mg/dL — ABNORMAL HIGH (ref 70–99)
Potassium: 4.1 mmol/L (ref 3.5–5.1)
Sodium: 132 mmol/L — ABNORMAL LOW (ref 135–145)

## 2020-10-16 LAB — CBC
HCT: 40.4 % (ref 39.0–52.0)
Hemoglobin: 13.2 g/dL (ref 13.0–17.0)
MCH: 30.3 pg (ref 26.0–34.0)
MCHC: 32.7 g/dL (ref 30.0–36.0)
MCV: 92.7 fL (ref 80.0–100.0)
Platelets: 297 K/uL (ref 150–400)
RBC: 4.36 MIL/uL (ref 4.22–5.81)
RDW: 13.7 % (ref 11.5–15.5)
WBC: 8.6 K/uL (ref 4.0–10.5)
nRBC: 0 % (ref 0.0–0.2)

## 2020-10-16 LAB — COOXEMETRY PANEL
Carboxyhemoglobin: 1.1 % (ref 0.5–1.5)
Methemoglobin: 0.8 % (ref 0.0–1.5)
O2 Saturation: 84.1 %
Total hemoglobin: 13.4 g/dL (ref 12.0–16.0)

## 2020-10-16 LAB — GLUCOSE, CAPILLARY
Glucose-Capillary: 131 mg/dL — ABNORMAL HIGH (ref 70–99)
Glucose-Capillary: 109 mg/dL — ABNORMAL HIGH (ref 70–99)

## 2020-10-16 LAB — MAGNESIUM: Magnesium: 2 mg/dL (ref 1.7–2.4)

## 2020-10-16 MED ORDER — TORSEMIDE 40 MG PO TABS: 80.0000 mg | ORAL_TABLET | Freq: Every day | ORAL | 0 refills | Status: DC

## 2020-10-16 MED ORDER — TORSEMIDE 20 MG PO TABS
80.0000 mg | ORAL_TABLET | Freq: Every day | ORAL | Status: DC
  Administered 2020-10-16: 80 mg via ORAL
  Filled 2020-10-16: qty 4

## 2020-10-16 MED ORDER — GUAIFENESIN 100 MG/5ML PO SOLN: 5.0000 mL | ORAL | 0 refills | Status: DC

## 2020-10-16 MED ORDER — POTASSIUM CHLORIDE CRYS ER 20 MEQ PO TBCR
20.0000 meq | EXTENDED_RELEASE_TABLET | Freq: Once | ORAL | Status: AC
  Administered 2020-10-16: 20 meq via ORAL
  Filled 2020-10-16: qty 1

## 2020-10-16 MED ORDER — AMIODARONE HCL 200 MG PO TABS: 200.0000 mg | ORAL_TABLET | Freq: Two times a day (BID) | ORAL | 0 refills | Status: DC

## 2020-10-16 MED ORDER — APIXABAN 5 MG PO TABS: 5.0000 mg | ORAL_TABLET | Freq: Two times a day (BID) | ORAL | 0 refills | Status: DC

## 2020-10-16 NOTE — TOC Transition Note (Signed)
Transition of Care [facility]) - CM/SW Discharge Note Heart Failure   Patient Details  Name: Phillip Young MRN: [id number] Date of Birth: 06-12-1986  Transition of Care [facility]) CM/SW Contact:  [place], [place] Phone Number: 10/16/2020, 2:14 PM   Clinical Narrative:    CSW received a message from the patients nurse that Mr. Fiene will be discharging today and needing oxygen. The doctor put in an order for the oxygen and CSW contacted Thedore Mins with [employer] [phone]) to get oxygen delivered to the patients room and to his house before Mr. Suda discharges. CSW reached out to the patients mother Ms. Crystopher Durst to inform her about the oxygen delivery from [employer].  No other needs identified at this time.  TOC is signing off.    Final next level of care: Home/Self Care Barriers to Discharge: Barriers Resolved   Patient Goals and CMS Choice        Discharge Placement                       Discharge Plan and Services In-house Referral: Clinical Social Work Discharge Planning Services: CM Consult            DME Arranged: Oxygen DME Agency: [employer] Date DME Agency Contacted: 10/16/20 Time DME Agency Contacted: 0102 Representative spoke with at DME Agency: Thedore Mins with [employer]            Social Determinants of Health ([place]) Interventions     Readmission Risk Interventions Readmission Risk Prevention Plan 10/16/2020 10/16/2020 10/14/2020  Transportation Screening Complete Complete Complete  PCP or Specialist Appt within 5-7 Days Complete Complete Complete  Home Care Screening Complete - -  Medication Review (RN CM) Referral to Pharmacy - -  Some recent data might be hidden    Cortlin Polo, MSW, [place] Heart Failure Social Worker

## 2020-10-16 NOTE — Progress Notes (Signed)
MD called and wanted patient to put CPAP on. Patient has personal CPAP in room at bedside. He was agreeable to wearing CPAP. RN set up for patient and waited to ensure CPAP was on and working correctly.Fuller [place], RN

## 2020-10-16 NOTE — Progress Notes (Signed)
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  o2 sats 81-85% on RA, pt doesn't complain of any distress.  MD and team aware, orders received to do a walk test on RA to see if pt qualifies for home o2.  Will ctm .

## 2020-10-16 NOTE — Progress Notes (Incomplete)
HD#8 SUBJECTIVE:  Overnight Events: No acute events overnight  OBJECTIVE:  Vital Signs: Vitals:   10/15/20 1623 10/15/20 2003 10/16/20 0021 10/16/20 0401  BP: 131/87 (!) 93/54 113/61 99/61  Pulse: 81 85 75 83  Resp: '19 20 19 20  '$ Temp: 97.7 F (36.5 C) 97.9 F (36.6 C) 97.6 F (36.4 C) 97.6 F (36.4 C)  TempSrc: Oral Oral Oral Oral  SpO2: 95% 97% (!) 40% 97%  Weight:    (!) 198.4 kg  Height:       Supplemental O2: Nasal Cannula SpO2: 97 % O2 Flow Rate (L/min): 4 L/min  Filed Weights   10/14/20 0415 10/15/20 0431 10/16/20 0401  Weight: (!) 212.6 kg (!) 207.2 kg (!) 198.4 kg     Intake/Output Summary (Last 24 hours) at 10/16/2020 0630 Last data filed at 10/15/2020 2100 Gross per 24 hour  Intake 1200 ml  Output 1800 ml  Net -600 ml   Net IO Since Admission: -15,501.93 mL [10/16/20 0630]  Constitutional: Obese HENT: normocephalic atraumatic Eyes: conjunctiva non-erythematous Neck: supple, difficult to assess JVP in setting of body habitus.  R IJ central line in place Cardiovascular: regular rate and rhythm, no m/r/g.  Pulmonary/Chest: Diminished breath sounds.  Saturating well 4 L no obvious wheezing or rhonchi Abdominal: soft, non-tender, non-distended MSK: normal bulk and tone Neurological: alert & oriented x 3 Skin: warm and dry.  1+ lower extremity edema Psych: Normal thought process   Patient Lines/Drains/Airways Status    Active Line/Drains/Airways    Name Placement date Placement time Site Days   Peripheral IV 10/09/20 Right;Anterior Forearm 10/09/20  0523  Forearm  2   CVC Triple Lumen 10/10/20 Right Internal jugular 10/10/20  2135  - 1          Recent Labs    10/15/20 1117 10/15/20 1615 10/15/20 2120  GLUCAP 116* 156* 119*     Pertinent Labs: CBC Latest Ref Rng & Units 10/16/2020 10/15/2020 10/14/2020  WBC 4.0 - 10.5 K/uL 8.6 7.2 6.8  Hemoglobin 13.0 - 17.0 g/dL 13.2 13.5 12.9(L)  Hematocrit 39.0 - 52.0 % 40.4 40.4 39.7  Platelets 150 -  400 K/uL 297 256 221    CMP Latest Ref Rng & Units 10/16/2020 10/15/2020 10/14/2020  Glucose 70 - 99 mg/dL 102(H) 110(H) 105(H)  BUN 6 - 20 mg/dL '19 16 10  '$ Creatinine 0.61 - 1.24 mg/dL 1.12 1.00 0.98  Sodium 135 - 145 mmol/L 132(L) 132(L) 133(L)  Potassium 3.5 - 5.1 mmol/L 4.1 4.2 3.9  Chloride 98 - 111 mmol/L 93(L) 92(L) 93(L)  CO2 22 - 32 mmol/L 33(H) 32 33(H)  Calcium 8.9 - 10.3 mg/dL 8.9 9.3 8.8(L)  Total Protein 6.5 - 8.1 g/dL - - -  Total Bilirubin 0.3 - 1.2 mg/dL - - -  Alkaline Phos 38 - 126 U/L - - -  AST 15 - 41 U/L - - -  ALT 0 - 44 U/L - - -   CVP: 14  Co-ox: 60%  Imaging: No results found.  ASSESSMENT/PLAN:   Active Problems:   CAP (community acquired pneumonia)   Patient Summary: Phillip Young a 35 y.o.with pertinent PMH of biventricular heart failure, diabetes, HLD, OSA, recent Covid infectionwho presented withdry cough, dyspnea on exertionand willadmit forPMV pneumonia complicated by CHF exacerbation.   Pneumometavirus pneumonia Acute hypoxic respiratory failure Pneumonia improving well.  Continue to wean oxygen as tolerable and continuous conservative treatment.  Patient continues to have cough during sleep, will request nurse to give  Robitussin prior to bedtime and CPAP placement. - Continue conservative treatment. - Continue Tylenol for fever  - Continue Robitussin, Tessalon for cough - Continue lidocaine patch for back pain secondary to cough - Keep SPO2 around 90 to 93% wean as tolerated requirement improving - VBG prn if patient develops acute respiratory distress  Biventricular heart failure(EF 20-25%, 05/2020) CVP of 14.  Patient feels much improved since diuresis yesterday with Lasix and metolazone.  Weight down 12 pounds with adequate urinary output. -We will continue guideline directed therapy with Entresto 49-51 mg twice daily, spironolactone 25 mg daily, dapagliflozin 10 mg daily -Continue IV Lasix 80 mg twice daily today and  metolazone 2.5 once -Monitor electrolytes -Strict ins and outs-1500 mL fluid restriction -Fluid and salt restricted diet -Daily weights -Titrate medications as needed -Trend CVP and Co-ox.  Atrial fibrillation w/ RVR-resolved Converted to normal sinus rhythm -Continue digoxin, amiodarone -Continue heparin  OSA Continue to encourage CPAP use  Best Practice: Diet: HH/Fluid restriction  IVF: PO intake (fluid restriction of 1500 cc) VTE: Heparin gtt Code: Full PT/OT: No further recommendations DISPO: Pending  Anticipated discharge in 1-2 days pending clinical improvement.  Sanjuana Letters DO  Internal Medicine Resident PGY-1 [place]  Pager: [phone]  Please contact the on call pager after 5 pm and on weekends at [phone].

## 2020-10-16 NOTE — Discharge Summary (Signed)
Name: Phillip Young MRN: [id number] DOB: 10-26-1985 35 y.o. PCP: Patient, No Pcp Per  Date of Admission: 10/08/2020 10:22 AM Date of Discharge: 10/16/2020  Attending Physician: Dr. Dareen Piano  DISCHARGE DIAGNOSIS:  Principal Problem:   CAP (community acquired pneumonia) Active Problems:   Acute on chronic systolic (congestive) heart failure ([place])   Essential hypertension   OSA (obstructive sleep apnea)   Prediabetes    DISCHARGE MEDICATIONS:   Allergies as of 10/16/2020      Reactions   Coreg [carvedilol] Shortness Of Breath, Diarrhea   Wheezing       Medication List    STOP taking these medications   Corlanor 5 MG Tabs tablet Generic drug: ivabradine     TAKE these medications   amiodarone 200 MG tablet Commonly known as: PACERONE Take 1 tablet (200 mg total) by mouth 2 (two) times daily.   apixaban 5 MG Tabs tablet Commonly known as: ELIQUIS Take 1 tablet (5 mg total) by mouth 2 (two) times daily.   dapagliflozin propanediol 10 MG Tabs tablet Commonly known as: FARXIGA Take 1 tablet (10 mg total) by mouth daily.   digoxin 0.125 MG tablet Commonly known as: LANOXIN Take 1 tablet (0.125 mg total) by mouth daily.   Entresto 49-51 MG Generic drug: sacubitril-valsartan Take 1 tablet by mouth 2 (two) times daily. What changed: Another medication with the same name was removed. Continue taking this medication, and follow the directions you see here.   guaiFENesin 100 MG/5ML Soln Commonly known as: ROBITUSSIN Take 5 mLs (100 mg total) by mouth every 4 (four) hours.   Potassium Chloride ER 20 MEQ Tbcr Take 20 mEq by mouth 2 (two) times daily.   spironolactone 25 MG tablet Commonly known as: ALDACTONE TAKE 1 TABLET(25 MG) BY MOUTH DAILY What changed: See the new instructions.   Torsemide 40 MG Tabs Take 80 mg by mouth daily. Start taking on: October 17, 2020 What changed:   medication strength  how much to take            Durable Medical Equipment   (From admission, onward)         Start     Ordered   10/16/20 1349  For home use only DME oxygen  Once       Question Answer Comment  Length of Need 6 Months   Mode or (Route) Nasal cannula   Liters per Minute 2   Oxygen delivery system Gas      10/16/20 1348          DISPOSITION AND FOLLOW-UP:  [name] was discharged from [facility] in Stable condition.  At the hospital follow up visit please address:  Follow-up Recommendations:  A. Viral CAP: Recommendations: Wean O2 Pending work-up: None Labs: None, Imaging: x-ray (in 4-6 weeks)  B. Acute on Chronic Systolic Heart Failure: Recommendations: Cont. GDT, torsemide increased to 80 mg at d/c Pending work-up: none Labs: Basic Metabolic Profile, Imaging: none  C. OSA: Recommendations: Make sure patient is compliant with CPAP Pending work-up: none Labs: none, Imaging: none  D. Atrial Fibrillation w/ RVR: Recommendations: Started on digoxin, amiodarone, and apixaban, reeval in 4-6 weeks for continuation of AC. Pending work-up: none Labs: Electrolyte, Imaging: EKG   E. Prediabetes: Recommendations: repeat A1c Pending work-up: none Labs: a1c, Imaging: none  Important changes since admission: Make sure patient is taking all of his Heart failure medications and Afib meds.   Follow-up Appointments:  Follow-up Information    [facility] Follow up on 11/04/2020.   Specialty: Cardiology Why: at 1200 Contact information: [address] [id number] [place], [place] [facility] Follow up.   Why: Oxygen to be delivered to hospital room and home prior to discharge.  Contact information: [place] [address] [phone]               HOSPITAL COURSE:   Community-acquired pneumonia secondary to metapneumovirus.   Patient presented with fevers and shortness of breath  hep B CID positive for metapneumovirus.  Patient was sent home on 2 L supplemental oxygen to be weaned in the outpatient setting.  A. fib with RVR: Patient presented with atrial fibrillation with rapid ventricular response in the setting of viral pneumonia and acute decompensation of his heart failure.  Patient was started on jocks and and amiodarone and anticoagulated with apixaban.  Patient will need follow-up in the outpatient setting in roughly 3 to 6 weeks to follow-up his atrial fibrillation.  Patient would also benefit from continuing use of CPAP at night due to underlying OSA.   Acute on chronic systolic heart failure: Patient presented with shortness of breath in the setting of viral pneumonia with subsequent decompensated systolic heart failure.  Patient was volume up at presentation requiring significant IV diuresis.  Patient was diuresed down to his dry weight of 240 pounds.  Patient remains on supplemental oxygen at discharge requiring 2 L.  He will need to be weaned down in the outpatient setting.  Patient will need follow-up with heart failure team in the following week or 2.  He will remain on his guideline directed heart failure medications.  He was increased to torsemide 80 mg prior to discharge.   OSA: Needs to stay on CPAP in the outpatient setting  DISCHARGE INSTRUCTIONS:   Discharge Instructions    Call MD for:  difficulty breathing, headache or visual disturbances   Complete by: As directed    Call MD for:  hives   Complete by: As directed    Call MD for:  persistant dizziness or light-headedness   Complete by: As directed    Call MD for:  persistant nausea and vomiting   Complete by: As directed    Call MD for:  redness, tenderness, or signs of infection (pain, swelling, redness, odor or green/yellow discharge around incision site)   Complete by: As directed    Call MD for:  severe uncontrolled pain   Complete by: As directed    Call MD for:  temperature >100.4    Complete by: As directed    Diet - low sodium heart healthy   Complete by: As directed    Discharge instructions   Complete by: As directed    [name] thank you for allowing [place] to care for you during her hospitalization.  You were admitted with viral pneumonia as well as heart failure exacerbation.  You are treated with conservative measures for your pneumonia.  You are given IV diuretics to help with your heart failure exacerbation.  He progressed well, but will continue with oxygen at home.  Please follow-up with your primary care provider on Monday to see if you need to continue this.  Please follow-up with your heart failure doctors as well.  We will be increasing your torsemide to 80 mg daily.  We have started you on 2 new medications amiodarone, and Eliquis.  I have  also added Robitussin for your cough, please take this every 4 hours as needed for coughing.   Increase activity slowly   Complete by: As directed       SUBJECTIVE:   Discharge Vitals:   BP 115/64 (BP Location: Left Wrist)   Pulse 79   Temp 97.7 F (36.5 C) (Oral)   Resp 20   Ht 6' (1.829 m)   Wt (!) 198.4 kg   SpO2 90%   BMI 59.32 kg/m   OBJECTIVE:  Pertinent Labs, Studies, and Procedures:  CBC Latest Ref Rng & Units 10/16/2020 10/15/2020 10/14/2020  WBC 4.0 - 10.5 K/uL 8.6 7.2 6.8  Hemoglobin 13.0 - 17.0 g/dL 13.2 13.5 12.9(L)  Hematocrit 39.0 - 52.0 % 40.4 40.4 39.7  Platelets 150 - 400 K/uL 297 256 221    CMP Latest Ref Rng & Units 10/16/2020 10/15/2020 10/14/2020  Glucose 70 - 99 mg/dL 102(H) 110(H) 105(H)  BUN 6 - 20 mg/dL '19 16 10  '$ Creatinine 0.61 - 1.24 mg/dL 1.12 1.00 0.98  Sodium 135 - 145 mmol/L 132(L) 132(L) 133(L)  Potassium 3.5 - 5.1 mmol/L 4.1 4.2 3.9  Chloride 98 - 111 mmol/L 93(L) 92(L) 93(L)  CO2 22 - 32 mmol/L 33(H) 32 33(H)  Calcium 8.9 - 10.3 mg/dL 8.9 9.3 8.8(L)  Total Protein 6.5 - 8.1 g/dL - - -  Total Bilirubin 0.3 - 1.2 mg/dL - - -  Alkaline Phos 38 - 126 U/L - - -  AST 15 - 41  U/L - - -  ALT 0 - 44 U/L - - -    DG Chest 2 View  Result Date: 10/08/2020 CLINICAL DATA:  Dyspnea.  Productive cough.  Fever/body aches. EXAM: CHEST - 2 VIEW COMPARISON:  07/29/2020. FINDINGS: New consolidation in the right lower lung. No visible pleural effusions or pneumothorax. Similar enlarged cardiac silhouette. Similar pulmonary vascular congestion. IMPRESSION: 1. New consolidation in the right lower lung, concerning for pneumonia. Recommend imaging follow-up to resolution. 2. Similar cardiomegaly and pulmonary vascular congestion. Electronically Signed   By: Margaretha Sheffield MD   On: 10/08/2020 11:14   CT ANGIO CHEST PE W OR WO CONTRAST  Result Date: 10/09/2020 CLINICAL DATA:  Positive D-dimer.  Question pulmonary emboli. EXAM: CT ANGIOGRAPHY CHEST WITH CONTRAST TECHNIQUE: Multidetector CT imaging of the chest was performed using the standard protocol during bolus administration of intravenous contrast. Multiplanar CT image reconstructions and MIPs were obtained to evaluate the vascular anatomy. CONTRAST:  165m OMNIPAQUE IOHEXOL 350 MG/ML SOLN COMPARISON:  Chest radiography yesterday.  Previous CT 02/19/2020 FINDINGS: Cardiovascular: Cardiomegaly. Small amount of pericardial fluid. No visible coronary artery calcification or aortic atherosclerotic calcification. Pulmonary arterial opacification is moderate. No central pulmonary emboli are seen. Small peripheral emboli could not be excluded given this degree of arterial opacification. Mediastinum/Nodes: No mediastinal or hilar mass. Mild nodal prominence, likely reactive. Lungs/Pleura: Bilateral lower lobe bronchopneumonia or extensive on the right than the left. No pleural effusion. Upper Abdomen: Negative Musculoskeletal: Negative Review of the MIP images confirms the above findings. IMPRESSION: 1. Pulmonary arterial opacification is moderate. No central pulmonary emboli are seen. Small peripheral emboli could not be excluded given this degree  of arterial opacification. 2. Cardiomegaly. Small amount of pericardial fluid. 3. Bilateral lower lobe bronchopneumonia more extensive on the right than the left. Electronically Signed   By: MNelson ChimesM.D.   On: 10/09/2020 14:04     Signed: BLawerance Cruel D.O.  Internal Medicine Resident, PGY-2 [facility]  Pager: [phone] 5:36 PM, 10/16/2020

## 2020-10-16 NOTE — Progress Notes (Signed)
SATURATION QUALIFICATIONS: (This note is used to comply with regulatory documentation for home oxygen)  Patient Saturations on Room Air at Rest = 88%  Patient Saturations on Room Air while Ambulating = 84%  Patient Saturations on 1 Liters of oxygen while Ambulating = 90%  Please briefly explain why patient needs home oxygen: Pt desaturation with exertion to 84% on room air.

## 2020-10-16 NOTE — Progress Notes (Addendum)
Patient ID: Phillip Young, male   DOB: 1986-07-15, 35 y.o.   MRN: [id number] P   Advanced Heart Failure Rounding Note   Subjective:    Excellent response to IV Lasix + metolazone 2.5. Weight down another 19 pounds. Co-ox 84%  CVP down to 6.    Feeling much better Wants to go home. Denies SOB.   Objective:   Weight Range:  Vital Signs:   Temp:  [97.6 F (36.4 C)-97.9 F (36.6 C)] 97.6 F (36.4 C) (03/24 0401) Pulse Rate:  [75-85] 83 (03/24 0401) Resp:  [19-20] 20 (03/24 0401) BP: (93-131)/(54-87) 99/61 (03/24 0401) SpO2:  [40 %-97 %] 97 % (03/24 0401) Weight:  [198.4 kg] 198.4 kg (03/24 0401) Last BM Date: 10/15/20  Weight change: Filed Weights   10/14/20 0415 10/15/20 0431 10/16/20 0401  Weight: (!) 212.6 kg (!) 207.2 kg (!) 198.4 kg    Intake/Output:   Intake/Output Summary (Last 24 hours) at 10/16/2020 0831 Last data filed at 10/16/2020 0746 Gross per 24 hour  Intake 1560 ml  Output 1800 ml  Net -240 ml  CVP 5-6   General:  Well appearing. No resp difficulty HEENT: normal Neck: supple. no JVD. Carotids 2+ bilat; no bruits. No lymphadenopathy or thryomegaly appreciated. Cor: PMI nondisplaced. Regular rate & rhythm. No rubs, gallops or murmurs. Lungs: clear Abdomen: soft, nontender, nondistended. No hepatosplenomegaly. No bruits or masses. Good bowel sounds. Extremities: no cyanosis, clubbing, rash, edema Neuro: alert & orientedx3, cranial nerves grossly intact. moves all 4 extremities w/o difficulty. Affect pleasant .    Telemetry: NSR 70-80s   Labs: Basic Metabolic Panel: Recent Labs  Lab 10/10/20 1336 10/11/20 0500 10/12/20 0739 10/13/20 0447 10/13/20 0700 10/14/20 0736 10/15/20 0236 10/16/20 0450  NA 131* 131* 131* 133*  --  133* 132* 132*  K 3.8 4.0 4.0 3.6  --  3.9 4.2 4.1  CL 96* 94* 93* 93*  --  93* 92* 93*  CO2 '26 29 30 '$ 34*  --  33* 32 33*  GLUCOSE 130* 127* 116* 110*  --  105* 110* 102*  BUN '15 15 12 10  '$ --  '10 16 19  '$ CREATININE 1.30*  1.31* 1.14 0.99  --  0.98 1.00 1.12  CALCIUM 8.8* 8.6* 8.5* 8.3*  --  8.8* 9.3 8.9  MG 1.7 1.8  --   --  2.2 2.0 2.1 2.0  PHOS 4.1 4.1  --   --   --  4.4 4.9*  --     Liver Function Tests: Recent Labs  Lab 10/10/20 1336 10/11/20 0500 10/12/20 0739  AST  --  58* 60*  ALT  --  50* 57*  ALKPHOS  --  53 46  BILITOT  --  1.0 0.6  PROT  --  7.7 7.9  ALBUMIN 3.1* 3.0* 2.9*   No results for input(s): LIPASE, AMYLASE in the last 168 hours. No results for input(s): AMMONIA in the last 168 hours.  CBC: Recent Labs  Lab 10/12/20 0452 10/13/20 0447 10/14/20 0736 10/15/20 0236 10/16/20 0450  WBC 7.1 8.0 6.8 7.2 8.6  HGB 13.2 12.7* 12.9* 13.5 13.2  HCT 40.8 38.2* 39.7 40.4 40.4  MCV 92.5 92.5 93.0 91.4 92.7  PLT 214 214 221 256 297    Cardiac Enzymes: No results for input(s): CKTOTAL, CKMB, CKMBINDEX, TROPONINI in the last 168 hours.  BNP: BNP (last 3 results) Recent Labs    10/02/20 1015 10/08/20 1115 10/10/20 1336  BNP 153.4* 93.2 126.9*    ProBNP (  last 3 results) No results for input(s): PROBNP in the last 8760 hours.    Other results:  Imaging: No results found.   Medications:     Scheduled Medications: . acetaminophen  650 mg Oral Q6H   Or  . acetaminophen  650 mg Rectal Q6H  . amiodarone  200 mg Oral BID  . apixaban  5 mg Oral BID  . Chlorhexidine Gluconate Cloth  6 each Topical Daily  . dapagliflozin propanediol  10 mg Oral Daily  . digoxin  0.125 mg Oral Daily  . furosemide  80 mg Intravenous BID  . guaiFENesin  5 mL Oral Q4H  . lidocaine  1 patch Transdermal Q24H  . lidocaine (PF)  5 mL Intradermal Once  . potassium chloride  40 mEq Oral BID  . sacubitril-valsartan  1 tablet Oral BID  . spironolactone  25 mg Oral Daily    Infusions:   PRN Medications: benzonatate, levalbuterol, phenol   Assessment/Plan:    1. Acute hypoxic respiratory failure: Due to PNA. CXR and Chest CT w/ bilateral lower lobe bronchopneumonia worse on the right.  COVID negative. Respiratory panel + for Metapneumovirus.   - Management per primary, now off abx.  2. Atrial Fibrillation w/ RVR: New diagnosis. Suspect triggered by acute viral infection (PNA). Also in setting of structural heart diease and underlying OSA w/ poor compliance w/ CPAP.  Converted to SR on IV amio.   - Remains in NSR.   - Continue Eliquis.  - Continue amio 200 mg twice a day .  3. Acute on Chronic Biventricular Heart Failure: Known cardiomyopathy since 2019. Echo 02/27/2020 with EF <20%, moderate LV dilation, severely decreased RV function with severe RV dilation, severe biatrial enlargement, mild MR. No history of ETOH/drugs. No FH of cardiomyopathy (mother with [place]). 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 Suspect he is too large for cardiac MRI. Narrow QRS on ECG so not CRT candidate. RHC/LHC on 03/04/20 with no CAD (nonischemic cardiomyopathy), elevated left and right heart filling pressures, and low CI at 2.1. 05/2020 Echo showed EF 20-25%. RV was not well visualized. Saw EP but needs to lose weight for ICD. BNP only 92 but may be falsely low in setting of obesity.  Initially received IV Lasix and was switched to torsemide but CVP went back up. Now on IV lasix + metolazone with excellent response, weight down another 19 bs over the last day.  - CVP down to 5-6. Stop IV lasix. Start torsemide 80 mg daily.    - Continue Entresto 49-51 bid - Continue Spiro 25 mg daily  - Continue Farxiga 10 mg daily  - Continue digoxin 0.125 mg daily.  - Off ivabradine with new Afib  4. OSA: CPAP QHS  5. Super Morbid Obesity: Body mass index is 59.32 kg/m. - wt loss advised  - Consult dietitian   [facility] for d/c with higher dose of torsemide. We will set up HF follow up.   Amy Clegg NP-C  10/16/2020 8:31 AM  Patient seen with NP, agree with the above note.   He diuresed very well again yesterday, CVP down to 5. Coughing improved.   Breathing improved.   General: NAD, obese.  Neck: No JVD, no thyromegaly or thyroid nodule.  Lungs: Clear to auscultation bilaterally with normal respiratory effort. CV: Nondisplaced PMI.  Heart regular S1/S2, no S3/S4, no murmur.  No peripheral edema.   Abdomen: Soft, nontender, no hepatosplenomegaly, no distention.  Skin: Intact without lesions or  rashes.  Neurologic: Alert and oriented x 3.  Psych: Normal affect. Extremities: No clubbing or cyanosis.  HEENT: Normal.   Stop IV Lasix, will start higher dose of torsemide 80 mg daily for home.  Will leave other cardiac meds as current.    I agree that he can go home today from cardiac perspective and it appears that PNA treatment is completed.  Would keep him on his home cardiac meds except would increase his torsemide to 80 mg daily for home (will give this morning), continue Eliquis, and home on amiodarone 200 mg daily.   [name] 10/16/2020 8:47 AM

## 2020-10-16 NOTE — Progress Notes (Signed)
Physical Therapy Treatment Patient Details Name: Phillip Young MRN: [id number] DOB: 03/26/1986 Today's Date: 10/16/2020    History of Present Illness Patient is a 35 y/o male who presents with SOB, cough, fevers 10/08/20. CXR- RLL opacity. Admitted with pneumometavirus of the lower respiratory tract and respiratory failure. PMH includes OSA, HTN, Biventricular congestive heart failure.    PT Comments    Pt received in chair, agreeable to therapy session and with good participation and tolerance for hallway gait training. Pt continues to perform gait with Supervision (assist for line mgmt) and no assistive device, needs cues at time for standing rest breaks/pursed-lip breathing techniques as he desaturates with exertion. Pt making good progress toward mobility goals, performed transfers modI. Pt continues to benefit from PT services to progress toward functional mobility goals and anticipate pt safe to DC home once medically cleared.  Patient Saturations on Room Air at Rest = 88% Patient Saturations on [employer] while Ambulating = 84% Patient Saturations on 1 Liters of oxygen while Ambulating = 90% Please briefly explain why patient needs home oxygen: Pt desaturation with exertion to 84%   Follow Up Recommendations  No PT follow up;Supervision - Intermittent (may benefit from cardiopulmonary rehab in outpatient setting depending on progress acutely)     Equipment Recommendations  Other (comment) (would benefit from supplemental O2 with mobility)    Recommendations for Other Services       Precautions / Restrictions Precautions Precautions: Other (comment) Precaution Comments: watch 02 Restrictions Weight Bearing Restrictions: No    Mobility  Bed Mobility               General bed mobility comments: sitting in recliner chair    Transfers Overall transfer level: Needs assistance Equipment used: None Transfers: Sit to/from Stand Sit to Stand: Modified independent  (Device/Increase time)         General transfer comment: Stood from chair, no LOB  Ambulation/Gait Ambulation/Gait assistance: Supervision Gait Distance (Feet): 270 Feet Assistive device: None Gait Pattern/deviations: Step-through pattern;Decreased stride length;Wide base of support;Decreased step length - right;Decreased step length - left Gait velocity: grossly <0.6 m/s; variable   General Gait Details: Wide BOS 2/2 body habitus, x3 standing breaks >1 minute with cues for pursed-lip breathing. Pt on RA at rest and desat to 84% on RA with exertion; needs increased to 1L O2 [place] with exertion to maintain >88%, RN notified.   Stairs             Wheelchair Mobility    Modified Rankin (Stroke Patients Only)       Balance Overall balance assessment: Needs assistance Sitting-balance support: Feet supported;No upper extremity supported Sitting balance-Leahy Scale: Good     Standing balance support: During functional activity Standing balance-Leahy Scale: Fair Standing balance comment: no LOB, supervision for safety, leaning against wall while resting in hallway                            Cognition Arousal/Alertness: Awake/alert Behavior During Therapy: WFL for tasks assessed/performed Overall Cognitive Status: Within Functional Limits for tasks assessed                                 General Comments: cooperative, expressed some anxiety regarding O2 needs with exertion, reports he is motivated to lose weight so he won't need bipap      Exercises      General  Comments General comments (skin integrity, edema, etc.): HR 80's bpm during gait trial, see assessment comments for SpO2, pt needs 1L O2 [place] with exertion; no dizziness with transfers/gait and BP not further assessed; 3/10 RPE after gait trial      Pertinent Vitals/Pain Pain Assessment: Faces Faces Pain Scale: Hurts a little bit Pain Location: LBP, increases with time standing Pain  Descriptors / Indicators: Aching Pain Intervention(s): Monitored during session;Repositioned    Home Living                      Prior Function            PT Goals (current goals can now be found in the care plan section) Acute Rehab PT Goals Patient Stated Goal: "to get back to normal so I can help take care of my Mom." PT Goal Formulation: With patient Time For Goal Achievement: 10/26/20 Potential to Achieve Goals: Good Progress towards PT goals: Progressing toward goals    Frequency    Min 3X/week      PT Plan Current plan remains appropriate    Co-evaluation              AM-PAC PT "6 Clicks" Mobility   Outcome Measure  Help needed turning from your back to your side while in a flat bed without using bedrails?: None Help needed moving from lying on your back to sitting on the side of a flat bed without using bedrails?: None Help needed moving to and from a bed to a chair (including a wheelchair)?: None Help needed standing up from a chair using your arms (e.g., wheelchair or bedside chair)?: None Help needed to walk in hospital room?: A Little Help needed climbing 3-5 steps with a railing? : A Little 6 Click Score: 22    End of Session Equipment Utilized During Treatment: Oxygen Activity Tolerance: Patient tolerated treatment well Patient left: with call bell/phone within reach;Other (comment);in chair (bipap on) Nurse Communication: Mobility status PT Visit Diagnosis: Other (comment);Difficulty in walking, not elsewhere classified (R26.2) (dyspnea)     Time: [id number] PT Time Calculation (min) (ACUTE ONLY): 15 min  Charges:  $Gait Training: 8-22 mins                     Keyundra Fant P., PTA Acute Rehabilitation Services Pager: [phone] Office: [place] 10/16/2020, 12:51 PM

## 2020-10-16 NOTE — TOC Progression Note (Signed)
Transition of Care (TOC) - Progression Note  Heart Failure  Patient Details  Name: Zyran Eyer MRN: [id number] Date of Birth: 1986/03/20  Transition of Care [facility]) CM/SW [place], [place] Phone Number: 10/16/2020, 1:10 PM  Clinical Narrative:    CSW received a message from the patients nurse that Mr. Loatman will be discharging today and needing oxygen. The doctor put in an order for the oxygen and CSW contacted Thedore Mins with [employer] [phone]) to get oxygen delivered to the patients room and to his house before Mr. Cubero discharges. CSW reached out to the patients mother Ms. Dvante Rogier to inform her about the oxygen delivery from [employer].    Expected Discharge Plan: Home/Self Care Barriers to Discharge: Continued Medical Work up  Expected Discharge Plan and Services Expected Discharge Plan: Home/Self Care In-house Referral: Clinical Social Work Discharge Planning Services: CM Consult                                           Social Determinants of Health (SDOH) Interventions    Readmission Risk Interventions Readmission Risk Prevention Plan 10/14/2020 10/14/2020  Transportation Screening Complete -  PCP or Specialist Appt within 5-7 Days Complete Complete  Some recent data might be hidden   Cortlin Polo, MSW, [place] Heart Failure Social Worker

## 2020-10-21 ENCOUNTER — Other Ambulatory Visit (HOSPITAL_COMMUNITY): Payer: Self-pay

## 2020-10-21 ENCOUNTER — Ambulatory Visit: Payer: BC Managed Care – PPO | Admitting: Family Medicine

## 2020-10-21 DIAGNOSIS — Z0289 Encounter for other administrative examinations: Secondary | ICD-10-CM

## 2020-10-21 MED ORDER — TORSEMIDE 40 MG PO TABS: 80.0000 mg | ORAL_TABLET | Freq: Every day | ORAL | 0 refills | Status: DC

## 2020-11-04 ENCOUNTER — Telehealth (HOSPITAL_COMMUNITY): Payer: Self-pay | Admitting: Cardiology

## 2020-11-04 ENCOUNTER — Encounter (HOSPITAL_COMMUNITY): Payer: BC Managed Care – PPO

## 2020-11-04 NOTE — Telephone Encounter (Signed)
appt call reminder complete Unable to speak with patient no answer Unable to leave message as VM full

## 2020-11-07 ENCOUNTER — Telehealth (HOSPITAL_COMMUNITY): Payer: Self-pay | Admitting: Cardiology

## 2020-11-07 ENCOUNTER — Encounter (HOSPITAL_COMMUNITY): Payer: Self-pay

## 2020-11-07 ENCOUNTER — Other Ambulatory Visit (HOSPITAL_COMMUNITY): Payer: Self-pay

## 2020-11-07 ENCOUNTER — Other Ambulatory Visit: Payer: Self-pay

## 2020-11-07 ENCOUNTER — Ambulatory Visit (HOSPITAL_COMMUNITY)
Admission: RE | Admit: 2020-11-07 | Discharge: 2020-11-07 | Disposition: A | Discharge status: Home / Self Care | Payer: BC Managed Care – PPO | Source: Ambulatory Visit | Attending: Adult Health | Admitting: Adult Health

## 2020-11-07 VITALS — BP 112/90 | HR 100 | Wt >= 6400 oz

## 2020-11-07 DIAGNOSIS — I5023 Acute on chronic systolic (congestive) heart failure: Secondary | ICD-10-CM

## 2020-11-07 DIAGNOSIS — Z79899 Other long term (current) drug therapy: Secondary | ICD-10-CM | POA: Insufficient documentation

## 2020-11-07 DIAGNOSIS — I110 Hypertensive heart disease with heart failure: Secondary | ICD-10-CM | POA: Diagnosis not present

## 2020-11-07 DIAGNOSIS — I5022 Chronic systolic (congestive) heart failure: Secondary | ICD-10-CM | POA: Diagnosis not present

## 2020-11-07 DIAGNOSIS — Z87891 Personal history of nicotine dependence: Secondary | ICD-10-CM | POA: Insufficient documentation

## 2020-11-07 DIAGNOSIS — I428 Other cardiomyopathies: Secondary | ICD-10-CM

## 2020-11-07 DIAGNOSIS — Z8249 Family history of ischemic heart disease and other diseases of the circulatory system: Secondary | ICD-10-CM | POA: Insufficient documentation

## 2020-11-07 DIAGNOSIS — Z7984 Long term (current) use of oral hypoglycemic drugs: Secondary | ICD-10-CM | POA: Insufficient documentation

## 2020-11-07 DIAGNOSIS — E119 Type 2 diabetes mellitus without complications: Secondary | ICD-10-CM | POA: Diagnosis not present

## 2020-11-07 DIAGNOSIS — I5082 Biventricular heart failure: Secondary | ICD-10-CM | POA: Diagnosis not present

## 2020-11-07 DIAGNOSIS — G4733 Obstructive sleep apnea (adult) (pediatric): Secondary | ICD-10-CM | POA: Diagnosis not present

## 2020-11-07 DIAGNOSIS — R0602 Shortness of breath: Secondary | ICD-10-CM | POA: Insufficient documentation

## 2020-11-07 DIAGNOSIS — I480 Paroxysmal atrial fibrillation: Secondary | ICD-10-CM

## 2020-11-07 DIAGNOSIS — Z6841 Body Mass Index (BMI) 40.0 and over, adult: Secondary | ICD-10-CM | POA: Insufficient documentation

## 2020-11-07 LAB — CBC
HCT: 42.7 % (ref 39.0–52.0)
Hemoglobin: 14 g/dL (ref 13.0–17.0)
MCH: 30.4 pg (ref 26.0–34.0)
MCHC: 32.8 g/dL (ref 30.0–36.0)
MCV: 92.6 fL (ref 80.0–100.0)
Platelets: 242 10*3/uL (ref 150–400)
RBC: 4.61 MIL/uL (ref 4.22–5.81)
RDW: 14.7 % (ref 11.5–15.5)
WBC: 8.6 10*3/uL (ref 4.0–10.5)
nRBC: 0 % (ref 0.0–0.2)

## 2020-11-07 LAB — BASIC METABOLIC PANEL
Anion gap: 9 (ref 5–15)
BUN: 9 mg/dL (ref 6–20)
CO2: 27 mmol/L (ref 22–32)
Calcium: 9.4 mg/dL (ref 8.9–10.3)
Chloride: 100 mmol/L (ref 98–111)
Creatinine, Ser: 0.93 mg/dL (ref 0.61–1.24)
GFR, Estimated: >60 mL/min (ref >60)
Glucose, Bld: 111 mg/dL — ABNORMAL HIGH (ref 70–99)
Potassium: 3.7 mmol/L (ref 3.5–5.1)
Sodium: 136 mmol/L (ref 135–145)

## 2020-11-07 LAB — BRAIN NATRIURETIC PEPTIDE: B Natriuretic Peptide: 106 pg/mL — ABNORMAL HIGH (ref 0.0–100.0)

## 2020-11-07 MED ORDER — ELIQUIS 5 MG PO TABS: 5.0000 mg | ORAL_TABLET | Freq: Two times a day (BID) | ORAL | 3 refills | Status: DC

## 2020-11-07 MED ORDER — FUROSEMIDE 10 MG/ML IJ SOLN
80.0000 mg | Freq: Once | INTRAMUSCULAR | Status: AC
  Administered 2020-11-07: 80 mg via INTRAVENOUS
  Filled 2020-11-07: qty 8

## 2020-11-07 MED ORDER — POTASSIUM CHLORIDE CRYS ER 20 MEQ PO TBCR
20.0000 meq | EXTENDED_RELEASE_TABLET | Freq: Once | ORAL | Status: AC
  Administered 2020-11-07: 20 meq via ORAL
  Filled 2020-11-07: qty 1

## 2020-11-07 NOTE — Telephone Encounter (Signed)
appt call reminder placed No answer and unable to leave message VM full [phone] No answer unable to leave message [phone]

## 2020-11-07 NOTE — Progress Notes (Signed)
PCP: Patient, No Pcp Per (Inactive) Cardiology: Dr. [name]  HPI: 35 y.o. with history of cardiomyopathy and obesity.  He has been known to have a cardiomyopathy (biventricular failure) since back in 2019, echo in 2019 with EF 20% [facility]).  He has been followed in the [facility] system.  Admitted to [facility] in [id number] with A/C Systolic HF. Diuresed with IV lasix and transitioned to torsemide. Placed on milrinone with low output and had RHC/LHC. Cath showed no CAD and volume overload.  Blood gas revealed a CO2 level elevated to 78. CPAP was initiated.   He was unable to tolerate Coreg due to wheezing and diarrhea.   06/17/2020 Echo EF 20-25%, RV poorly visualized.   Admitted 10/08/20 with viral CAP and A/C systolic heart failure. Treated with antibiotics and diuresed with IV lasix. Once diuresed transitioned to 80 mg torsemide daily. Had new onset A fib RVR thought to be triggered by CAP.  Placed on dig + amio + elqiuis for A fib RVR. Unfortunately he was not able to pay for amiodarone or eliquis due to cost so he started ivabradine. He was switched to Bipap at discharge but he could not afford the Co-pay. Discharge weight 437 pounds.   Today he returns for post hospital heart failure follow up. Overall feeling ok.  Remains SOB with exertion. Denies PND/Orthopnea. Unable to pay for Bipap due to cost. Says it was 200.00 so he sent it back. Appetite ok. Eating chips and cheez its.  Drinks lots of fluids. No fever or chills. Weight at home has gone up 20 pounds. He could not afford elquis or amiodarone. He has been taking ivabradine instead of amiodarone.  Works full time as a Recruitment consultant. Usually takes torsemide at night.   Labs (10/21): K 3.7, creatinine 1.07 Labs (1/22): K 3.6, creatinine 1.02 Labs (10/16/20): K 4.1 Creatinine 1.12   PMH: 1. Hidradenitis suppuritiva 2. GERD 3. OSA: Suspected.  4. Diabetes 5. Morbid obesity 6. Fe deficiency anemia 7. Chronic systolic CHF: Nonischemic  cardiomyopathy.  No history of ETOH or drugs.  +ANA.  No strong FH of cardiomyopathy.  - Echo (10/19, [facility]): EF 20%.  - Echo (5/21, [facility]): EF 20% - Echo (8/21): EF < 20%, moderate LV dilation, severely decreased RV function with severe RV dilation, severe biatrial enlargement, mild MR.  - Echo 06/17/2020 EF  - LHC/RHC (8/21): No significant coronary disease; mean RA 19, PA 71/20 mean 39, mean PCWP 23, CI 2.1, PVR 2.5 WU.  - Echo (11/21): EF 20-25%, RV poorly visualized.  8. Prior smoker.   ROS: All systems negative except as listed in HPI, PMH and Problem List.  Social History   Socioeconomic History  . Marital status: Unknown    Spouse name: Not on file  . Number of children: Not on file  . Years of education: Not on file  . Highest education level: Not on file  Occupational History  . Not on file  Tobacco Use  . Smoking status: Former Research scientist (life sciences)  . Smokeless tobacco: Former Systems developer  . Tobacco comment: quit in 2019  Substance and Sexual Activity  . Alcohol use: Never  . Drug use: Never  . Sexual activity: Not on file  Other Topics Concern  . Not on file  Social History Narrative  . Not on file   Social Determinants of Health   Financial Resource Strain: Not on file  Food Insecurity: Not on file  Transportation Needs: Not on file  Physical  Activity: Not on file  Stress: Not on file  Social Connections: Not on file  Intimate Partner Violence: Not on file   Family History  Problem Relation Age of Onset  . Heart disease Mother   . Hypertension Mother   . Pulmonary Hypertension Mother   . Drug abuse Father        died due to Heroin overdose   Past Medical History:  Diagnosis Date  . Biventricular congestive heart failure ([place])    Last Echo 11/2019 at [facility] reveals EF 20%  . Class 3 severe obesity due to excess calories with serious comorbidity and body mass index (BMI) of 50.0 to 59.9 in adult ([place]) 02/26/2020  . Essential hypertension 02/26/2020  . GERD  without esophagitis 02/26/2020  . Hidradenitis suppurativa 02/26/2020  . OSA (obstructive sleep apnea) 02/26/2020  . Prediabetes 02/26/2020   Current Outpatient Medications  Medication Sig Dispense Refill  . dapagliflozin propanediol (FARXIGA) 10 MG TABS tablet Take 1 tablet (10 mg total) by mouth daily. 90 tablet 3  . digoxin (LANOXIN) 0.125 MG tablet Take 1 tablet (0.125 mg total) by mouth daily. 90 tablet 3  . ivabradine (CORLANOR) 5 MG TABS tablet Take 7.5 mg by mouth 2 (two) times daily with a meal.    . Potassium Chloride ER 20 MEQ TBCR Take 20 mEq by mouth 2 (two) times daily. 180 tablet 3  . sacubitril-valsartan (ENTRESTO) 49-51 MG Take 1 tablet by mouth 2 (two) times daily.    Marland Kitchen spironolactone (ALDACTONE) 25 MG tablet TAKE 1 TABLET(25 MG) BY MOUTH DAILY 90 tablet 0  . Torsemide 40 MG TABS Take 80 mg by mouth daily. 120 tablet 0  . guaiFENesin (ROBITUSSIN) 100 MG/5ML SOLN Take 5 mLs (100 mg total) by mouth every 4 (four) hours. 236 mL 0   No current facility-administered medications for this encounter.   Vitals:   11/07/20 0913  BP: 112/90  Pulse: 100  SpO2: 93%  Weight: (!) 211.6 kg   Wt Readings from Last 3 Encounters:  11/07/20 (!) 211.6 kg  10/16/20 (!) 198.4 kg  10/02/20 (!) 211.6 kg   PHYSICAL EXAM: General:  Walked slowly in the clinic. No resp difficulty HEENT: normal Neck: supple. JVP to jaw. Carotids 2+ bilat; no bruits. No lymphadenopathy or thryomegaly appreciated. Cor: PMI nondisplaced. Regular rate & rhythm. No rubs, gallops or murmurs. Lungs: clear decreased in the bases.  Abdomen: obese, soft, nontender, nondistended. No hepatosplenomegaly. No bruits or masses. Good bowel sounds. Extremities: no cyanosis, clubbing, rash, Rand LLE 2+ edema Neuro: alert & orientedx3, cranial nerves grossly intact. moves all 4 extremities w/o difficulty. Affect pleasant  EKG: SR 96 bpm    ASSESSMENT & PLAN: 1. Chronic HFrEF: Known cardiomyopathy since 2019. Echo 02/27/2020 his  admission with EF <20%, moderate LV dilation, severely decreased RV function with severe RV dilation, severe biatrial enlargement, mild MR. No history of ETOH/drugs. No FH of cardiomyopathy (mother with [place]). 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 Suspect he is too large for cardiac MRI. Narrow QRS on ECG so not CRT candidate. RHC/LHC on 03/04/20 with no CAD (nonischemic cardiomyopathy), elevated left and right heart filling pressures, and low CI at 2.1. 05/2020 Echo showed EF 20-25%. Saw EP but needs to lose weight for ICD.  - NYHA III. Volume status elevated. Weight up over 20 pounds from discharge. Given 80 mg IV lasix x1 + 20 meq of potassium--> excellent response with 1 liter urine noted prior to  d/c.  -Tomorrow he will take torsemide 80 mg twice a day x2 days then back to torsemide 80 mg daily.  - Continue digoxin 0.125 daily.  - Continue spironolactone 25 mg daily  - Continue Farxiga 10 mg daily. No GU symptoms. - He did not tolerate Coreg (Updated allergies).  - Stop ivabradine today.  -Check BMET  2. OSA: Hypercarbic respiratory failure in hospital.  -No longer using Bipap due to cost.  3. Obesity : Body mass index is 63.26 kg/m.   - [facility]  4. DM2: a1c 6.8, working on getting PCP. 5. PAF -He was unable to start eliquis & amio due to cost after d/c  -EKG today -->NSR today.  -Will need to stop ivabradine.  - Hold off on amiodarone.  - Start elquis 5 mg twice a day. We verified with pharmacy team he has $0 co pay for eliquis. Stressed that he needed to start due to risk of stroke.   Check BMET today.   Referred to HFSW and pharmacy team for assistance with medications. Follow up in 1-2 weeks.  Will need to reassess volume status. Discussed low salt food choices and limiting fluid intake to < 2 liters per day. Stressed importance of taking all medications. Warren Lacy Emelina Hinch  NP-C   11/07/2020

## 2020-11-07 NOTE — Patient Instructions (Addendum)
INCREASE Torsemide to 80 mg twice a day for two days, then resume 80 mg daily thereafter START Eliquis 5 mg twice a day  Labs today We will only contact you if something comes back abnormal or we need to make some changes. Otherwise no news is good news!  Your physician recommends that you schedule a follow-up appointment in: 1-2 weeks  Do the following things EVERYDAY: 1) Weigh yourself in the morning before breakfast. Write it down and keep it in a log. 2) Take your medicines as prescribed 3) Eat low salt foods--Limit salt (sodium) to 2000 mg per day.  4) Stay as active as you can everyday 5) Limit all fluids for the day to less than 2 liters  If you have any questions or concerns before your next appointment please send [place] a message through [place] or call our office at [phone].    TO LEAVE A MESSAGE FOR THE NURSE SELECT OPTION 2, PLEASE LEAVE A MESSAGE INCLUDING: . YOUR NAME . DATE OF BIRTH . CALL BACK NUMBER . REASON FOR CALL**this is important as we prioritize the call backs  YOU WILL RECEIVE A CALL BACK THE SAME DAY AS LONG AS YOU CALL BEFORE 4:00 PM

## 2020-11-16 DIAGNOSIS — I5082 Biventricular heart failure: Secondary | ICD-10-CM | POA: Diagnosis not present

## 2020-11-16 DIAGNOSIS — I5023 Acute on chronic systolic (congestive) heart failure: Secondary | ICD-10-CM | POA: Diagnosis not present

## 2020-11-20 ENCOUNTER — Ambulatory Visit (HOSPITAL_COMMUNITY)
Admission: RE | Admit: 2020-11-20 | Discharge: 2020-11-20 | Disposition: A | Discharge status: Home / Self Care | Payer: BC Managed Care – PPO | Source: Ambulatory Visit | Attending: Internal Medicine | Admitting: Internal Medicine

## 2020-11-20 ENCOUNTER — Encounter (HOSPITAL_COMMUNITY): Payer: Self-pay

## 2020-11-20 ENCOUNTER — Other Ambulatory Visit: Payer: Self-pay

## 2020-11-20 VITALS — BP 118/78 | HR 97 | Resp 95 | Wt >= 6400 oz

## 2020-11-20 DIAGNOSIS — G4733 Obstructive sleep apnea (adult) (pediatric): Secondary | ICD-10-CM

## 2020-11-20 DIAGNOSIS — Z7901 Long term (current) use of anticoagulants: Secondary | ICD-10-CM | POA: Diagnosis not present

## 2020-11-20 DIAGNOSIS — Z8249 Family history of ischemic heart disease and other diseases of the circulatory system: Secondary | ICD-10-CM | POA: Diagnosis not present

## 2020-11-20 DIAGNOSIS — E119 Type 2 diabetes mellitus without complications: Secondary | ICD-10-CM | POA: Diagnosis not present

## 2020-11-20 DIAGNOSIS — I428 Other cardiomyopathies: Secondary | ICD-10-CM

## 2020-11-20 DIAGNOSIS — I5022 Chronic systolic (congestive) heart failure: Secondary | ICD-10-CM

## 2020-11-20 DIAGNOSIS — R0602 Shortness of breath: Secondary | ICD-10-CM | POA: Insufficient documentation

## 2020-11-20 DIAGNOSIS — Z79899 Other long term (current) drug therapy: Secondary | ICD-10-CM | POA: Insufficient documentation

## 2020-11-20 DIAGNOSIS — I5082 Biventricular heart failure: Secondary | ICD-10-CM | POA: Diagnosis not present

## 2020-11-20 DIAGNOSIS — I5023 Acute on chronic systolic (congestive) heart failure: Secondary | ICD-10-CM | POA: Diagnosis not present

## 2020-11-20 DIAGNOSIS — I110 Hypertensive heart disease with heart failure: Secondary | ICD-10-CM | POA: Diagnosis not present

## 2020-11-20 DIAGNOSIS — Z7984 Long term (current) use of oral hypoglycemic drugs: Secondary | ICD-10-CM | POA: Diagnosis not present

## 2020-11-20 DIAGNOSIS — Z6841 Body Mass Index (BMI) 40.0 and over, adult: Secondary | ICD-10-CM | POA: Diagnosis not present

## 2020-11-20 DIAGNOSIS — I5032 Chronic diastolic (congestive) heart failure: Secondary | ICD-10-CM | POA: Diagnosis not present

## 2020-11-20 DIAGNOSIS — Z87891 Personal history of nicotine dependence: Secondary | ICD-10-CM | POA: Insufficient documentation

## 2020-11-20 DIAGNOSIS — I480 Paroxysmal atrial fibrillation: Secondary | ICD-10-CM | POA: Insufficient documentation

## 2020-11-20 LAB — BASIC METABOLIC PANEL
Anion gap: 11 (ref 5–15)
BUN: 7 mg/dL (ref 6–20)
CO2: 30 mmol/L (ref 22–32)
Calcium: 9 mg/dL (ref 8.9–10.3)
Chloride: 94 mmol/L — ABNORMAL LOW (ref 98–111)
Creatinine, Ser: 0.82 mg/dL (ref 0.61–1.24)
GFR, Estimated: >60 mL/min (ref >60)
Glucose, Bld: 95 mg/dL (ref 70–99)
Potassium: 3.1 mmol/L — ABNORMAL LOW (ref 3.5–5.1)
Sodium: 135 mmol/L (ref 135–145)

## 2020-11-20 NOTE — Progress Notes (Signed)
PCP: Patient, No Pcp Per (Inactive) Cardiology: Dr. [name]  HPI: 35 y.o. with history of cardiomyopathy and obesity.  He has been known to have a cardiomyopathy (biventricular failure) since back in 2019, echo in 2019 with EF 20% [facility]).  He has been followed in the [facility] system.  Admitted to [facility] in [id number] with A/C Systolic HF. Diuresed with IV lasix and transitioned to torsemide. Placed on milrinone with low output and had RHC/LHC. Cath showed no CAD and volume overload.  Blood gas revealed a CO2 level elevated to 78. CPAP was initiated.   He was unable to tolerate Coreg due to wheezing and diarrhea.   06/17/2020 Echo EF 20-25%, RV poorly visualized.   Admitted 10/08/20 with viral CAP and A/C systolic heart failure. Treated with antibiotics and diuresed with IV lasix. Once diuresed transitioned to 80 mg torsemide daily. Had new onset A fib RVR thought to be triggered by CAP.  Placed on dig + amio + elqiuis for A fib RVR. Unfortunately he was not able to pay for amiodarone or eliquis due to cost so he started ivabradine. He was switched to Bipap at discharge but he could not afford the Co-pay. Discharge weight 437 pounds.   He was seen on 11/07/20 and given IV lasix and instructed to take torsemide 80 mg twice a day x2 days then 80 mg daily.  Also stopped ivabradine and restarted eliquis. He had not been taking eliquis because he thought it was to expensive. We verified $0 co pay.   Today he returns for HF follow up.Overall feeling much better. SOB with exertion but much improved. SOB with exertion. Denies PND/Orthopnea. Appetite ok. No fever or chills. Weight at home 443-447 pounds. Taking all medications. Planning to fly to [place] today. Plans to take his meds with him. 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   Labs (10/21): K 3.7, creatinine 1.07 Labs (1/22): K 3.6, creatinine 1.02 Labs (10/16/20): K 4.1 Creatinine 1.12   PMH: 1. Hidradenitis suppuritiva 2. GERD 3.  OSA: Suspected.  4. Diabetes 5. Morbid obesity 6. Fe deficiency anemia 7. Chronic systolic CHF: Nonischemic cardiomyopathy.  No history of ETOH or drugs.  +ANA.  No strong FH of cardiomyopathy.  - Echo (10/19, [facility]): EF 20%.  - Echo (5/21, [facility]): EF 20% - Echo (8/21): EF < 20%, moderate LV dilation, severely decreased RV function with severe RV dilation, severe biatrial enlargement, mild MR.  - Echo 06/17/2020 EF  - LHC/RHC (8/21): No significant coronary disease; mean RA 19, PA 71/20 mean 39, mean PCWP 23, CI 2.1, PVR 2.5 WU.  - Echo (11/21): EF 20-25%, RV poorly visualized.  8. Prior smoker.   ROS: All systems negative except as listed in HPI, PMH and Problem List.  Social History   Socioeconomic History  . Marital status: Unknown    Spouse name: Not on file  . Number of children: Not on file  . Years of education: Not on file  . Highest education level: Not on file  Occupational History  . Not on file  Tobacco Use  . Smoking status: Former Research scientist (life sciences)  . Smokeless tobacco: Former Systems developer  . Tobacco comment: quit in 2019  Substance and Sexual Activity  . Alcohol use: Never  . Drug use: Never  . Sexual activity: Not on file  Other Topics Concern  . Not on file  Social History Narrative  . Not on file   Social Determinants of Health  Financial Resource Strain: Not on file  Food Insecurity: Not on file  Transportation Needs: Not on file  Physical Activity: Not on file  Stress: Not on file  Social Connections: Not on file  Intimate Partner Violence: Not on file   Family History  Problem Relation Age of Onset  . Heart disease Mother   . Hypertension Mother   . Pulmonary Hypertension Mother   . Drug abuse Father        died due to Heroin overdose   Past Medical History:  Diagnosis Date  . Biventricular congestive heart failure ([place])    Last Echo 11/2019 at [facility] reveals EF 20%  . Class 3 severe obesity due to excess calories with serious comorbidity  and body mass index (BMI) of 50.0 to 59.9 in adult ([place]) 02/26/2020  . Essential hypertension 02/26/2020  . GERD without esophagitis 02/26/2020  . Hidradenitis suppurativa 02/26/2020  . OSA (obstructive sleep apnea) 02/26/2020  . Prediabetes 02/26/2020   Current Outpatient Medications  Medication Sig Dispense Refill  . apixaban (ELIQUIS) 5 MG TABS tablet Take 1 tablet (5 mg total) by mouth 2 (two) times daily. 180 tablet 3  . dapagliflozin propanediol (FARXIGA) 10 MG TABS tablet Take 1 tablet (10 mg total) by mouth daily. 90 tablet 3  . digoxin (LANOXIN) 0.125 MG tablet Take 1 tablet (0.125 mg total) by mouth daily. 90 tablet 3  . guaiFENesin (ROBITUSSIN) 100 MG/5ML SOLN Take 5 mLs (100 mg total) by mouth every 4 (four) hours. 236 mL 0  . Potassium Chloride ER 20 MEQ TBCR Take 20 mEq by mouth 2 (two) times daily. 180 tablet 3  . sacubitril-valsartan (ENTRESTO) 49-51 MG Take 1 tablet by mouth 2 (two) times daily.    Marland Kitchen spironolactone (ALDACTONE) 25 MG tablet TAKE 1 TABLET(25 MG) BY MOUTH DAILY 90 tablet 0  . Torsemide 40 MG TABS Take 80 mg by mouth daily. 120 tablet 0   No current facility-administered medications for this encounter.   Vitals:   11/20/20 1215  BP: 118/78  Pulse: 97  Resp: (!) 95  Weight: (!) 206.4 kg (455 lb)   Wt Readings from Last 3 Encounters:  11/20/20 (!) 206.4 kg (455 lb)  11/07/20 (!) 211.6 kg (466 lb 6.4 oz)  10/16/20 (!) 198.4 kg (437 lb 6.4 oz)   PHYSICAL EXAM: General:  Walked in the clinic. No resp difficulty HEENT: normal Neck: supple. JVP 7-8  Carotids 2+ bilat; no bruits. No lymphadenopathy or thryomegaly appreciated. Cor: PMI nondisplaced. Regular rate & rhythm. No rubs, gallops or murmurs. Lungs: clear Abdomen: obese, soft, nontender, nondistended. No hepatosplenomegaly. No bruits or masses. Good bowel sounds. Extremities: no cyanosis, clubbing, rash, R and LLE trace edema Neuro: alert & orientedx3, cranial nerves grossly intact. moves all 4 extremities w/o  difficulty. Affect pleasant    ASSESSMENT & PLAN: 1. Chronic HFrEF: Known cardiomyopathy since 2019. Echo 02/27/2020 his admission with EF <20%, moderate LV dilation, severely decreased RV function with severe RV dilation, severe biatrial enlargement, mild MR. No history of ETOH/drugs. No FH of cardiomyopathy (mother with [place]). 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 Suspect he is too large for cardiac MRI. Narrow QRS on ECG so not CRT candidate. RHC/LHC on 03/04/20 with no CAD (nonischemic cardiomyopathy), elevated left and right heart filling pressures, and low CI at 2.1. 05/2020 Echo showed EF 20-25%. Saw EP but needs to lose weight for ICD.  - NYHA III. Volume status mildly elevated. Increase torsemide  to 80 mg daily. He has only been taking 60 mg daily torsemide. Check BMET today  - Continue digoxin 0.125 daily. Dig level 0.2.  - Continue spironolactone 25 mg daily  - Continue Farxiga 10 mg daily. No GU symptoms. - He did not tolerate Coreg (Updated allergies).  2. OSA: Hypercarbic respiratory failure in hospital.  -No longer using Bipap due to cost. W 3. Obesity : Body mass index is 61.71 kg/m.   - [facility]  - Discussed portion control 4. DM2: a1c 6.8, working on getting PCP. 5. PAF -Keep off ivabradine given h/o A fib.  - Regular on exam.  - Continue  elquis 5 mg twice a day. He has the medicine has been taking.      . Darrick Grinder  NP-C  11/20/2020

## 2020-11-20 NOTE — Patient Instructions (Addendum)
  Please take Torsemide 80 mg daily Labs today.  We will call if abnormal.

## 2020-11-21 ENCOUNTER — Telehealth (HOSPITAL_COMMUNITY): Payer: Self-pay | Admitting: Surgery

## 2020-11-21 MED ORDER — POTASSIUM CHLORIDE ER 20 MEQ PO TBCR: 40.0000 meq | EXTENDED_RELEASE_TABLET | Freq: Two times a day (BID) | ORAL | 3 refills | Status: DC

## 2020-11-21 NOTE — Telephone Encounter (Signed)
Increased Potassium dosage to 40 meq BID per Dr. Haroldine Laws after recent labwork in clinic.  New prescription sent in.

## 2020-12-16 DIAGNOSIS — I5082 Biventricular heart failure: Secondary | ICD-10-CM | POA: Diagnosis not present

## 2020-12-16 DIAGNOSIS — I5023 Acute on chronic systolic (congestive) heart failure: Secondary | ICD-10-CM | POA: Diagnosis not present

## 2020-12-23 ENCOUNTER — Other Ambulatory Visit (HOSPITAL_COMMUNITY): Payer: Self-pay

## 2020-12-23 MED ORDER — SPIRONOLACTONE 25 MG PO TABS: ORAL_TABLET | ORAL | 0 refills | Status: DC

## 2020-12-23 NOTE — Progress Notes (Signed)
PCP: Patient, No Pcp Per (Inactive) Cardiology: Dr. [name]  HPI: 35 y.o. with history of cardiomyopathy and obesity.  He has been known to have a cardiomyopathy (biventricular failure) since back in 2019, echo in 2019 with EF 20% [facility]).  He has been followed in the [facility] system.  Admitted to [facility] in [id number] with A/C Systolic HF. Diuresed with IV lasix and transitioned to torsemide. Placed on milrinone with low output and had RHC/LHC. Cath showed no CAD and volume overload.  Blood gas revealed a CO2 level elevated to 78. CPAP was initiated.   He was unable to tolerate Coreg due to wheezing and diarrhea.   06/17/2020 Echo EF 20-25%, RV poorly visualized.   Admitted 10/08/20 with viral CAP and A/C systolic heart failure. Treated with antibiotics and diuresed with IV lasix. Once diuresed transitioned to 80 mg torsemide daily. Had new onset A fib RVR thought to be triggered by CAP.  Placed on dig + amio + elqiuis for A fib RVR. Unfortunately he was not able to pay for amiodarone or eliquis due to cost so he started ivabradine. He was switched to Bipap at discharge but he could not afford the Co-pay. Discharge weight 437 pounds.   He was seen on 11/07/20 and given IV lasix and instructed to take torsemide 80 mg twice a day x2 days then 80 mg daily.  Also stopped ivabradine and restarted eliquis. He had not been taking eliquis because he thought it was to expensive. We verified $0 co pay.   Today he returns for HF follow up.Overall feeling fine. Mild SOB with exertion but says its much better. Denies PND/Orthopnea. Appetite ok. No fever or chills. Weight at home 443 -446 pounds. Using oxygen at night. Not using Bipap due to cost. Taking all medication. Says he is walking more.  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   Labs (10/21): K 3.7, creatinine 1.07 Labs (1/22): K 3.6, creatinine 1.02 Labs (10/16/20): K 4.1 Creatinine 1.12  Labs (11/20/20) : K 3.1 Creatinine 0.8   PMH: 1.  Hidradenitis suppuritiva 2. GERD 3. OSA: Suspected.  4. Diabetes 5. Morbid obesity 6. Fe deficiency anemia 7. Chronic systolic CHF: Nonischemic cardiomyopathy.  No history of ETOH or drugs.  +ANA.  No strong FH of cardiomyopathy.  - Echo (10/19, [facility]): EF 20%.  - Echo (5/21, [facility]): EF 20% - Echo (8/21): EF < 20%, moderate LV dilation, severely decreased RV function with severe RV dilation, severe biatrial enlargement, mild MR.  - Echo 06/17/2020 EF  - LHC/RHC (8/21): No significant coronary disease; mean RA 19, PA 71/20 mean 39, mean PCWP 23, CI 2.1, PVR 2.5 WU.  - Echo (11/21): EF 20-25%, RV poorly visualized.  8. Prior smoker.   ROS: All systems negative except as listed in HPI, PMH and Problem List.  Social History   Socioeconomic History  . Marital status: Unknown    Spouse name: Not on file  . Number of children: Not on file  . Years of education: Not on file  . Highest education level: Not on file  Occupational History  . Not on file  Tobacco Use  . Smoking status: Former Research scientist (life sciences)  . Smokeless tobacco: Former Systems developer  . Tobacco comment: quit in 2019  Substance and Sexual Activity  . Alcohol use: Never  . Drug use: Never  . Sexual activity: Not on file  Other Topics Concern  . Not on file  Social History Narrative  .  Not on file   Social Determinants of Health   Financial Resource Strain: Not on file  Food Insecurity: Not on file  Transportation Needs: Not on file  Physical Activity: Not on file  Stress: Not on file  Social Connections: Not on file  Intimate Partner Violence: Not on file   Family History  Problem Relation Age of Onset  . Heart disease Mother   . Hypertension Mother   . Pulmonary Hypertension Mother   . Drug abuse Father        died due to Heroin overdose   Past Medical History:  Diagnosis Date  . Biventricular congestive heart failure ([place])    Last Echo 11/2019 at [facility] reveals EF 20%  . Class 3 severe obesity due to  excess calories with serious comorbidity and body mass index (BMI) of 50.0 to 59.9 in adult ([place]) 02/26/2020  . Essential hypertension 02/26/2020  . GERD without esophagitis 02/26/2020  . Hidradenitis suppurativa 02/26/2020  . OSA (obstructive sleep apnea) 02/26/2020  . Prediabetes 02/26/2020   Current Outpatient Medications  Medication Sig Dispense Refill  . apixaban (ELIQUIS) 5 MG TABS tablet Take 1 tablet (5 mg total) by mouth 2 (two) times daily. 180 tablet 3  . dapagliflozin propanediol (FARXIGA) 10 MG TABS tablet Take 1 tablet (10 mg total) by mouth daily. 90 tablet 3  . digoxin (LANOXIN) 0.125 MG tablet Take 1 tablet (0.125 mg total) by mouth daily. 90 tablet 3  . Potassium Chloride ER 20 MEQ TBCR Take 40 mEq by mouth 2 (two) times daily. 180 tablet 3  . sacubitril-valsartan (ENTRESTO) 97-103 MG Take 1 tablet by mouth 2 (two) times daily.    Marland Kitchen spironolactone (ALDACTONE) 25 MG tablet TAKE 1 TABLET(25 MG) BY MOUTH DAILY 90 tablet 0  . Torsemide 40 MG TABS Take 80 mg by mouth daily. 120 tablet 0   No current facility-administered medications for this encounter.   Vitals:   12/24/20 1205  BP: (!) 140/92  Pulse: 86  SpO2: 92%  Weight: (!) 206.7 kg (455 lb 12.8 oz)   Wt Readings from Last 3 Encounters:  12/24/20 (!) 206.7 kg (455 lb 12.8 oz)  11/20/20 (!) 206.4 kg (455 lb)  11/07/20 (!) 211.6 kg (466 lb 6.4 oz)   PHYSICAL EXAM: General:  Well appearing. No resp difficulty HEENT: normal Neck: supple. no JVD. Carotids 2+ bilat; no bruits. No lymphadenopathy or thryomegaly appreciated. Cor: PMI nondisplaced. Regular rate & rhythm. No rubs, gallops or murmurs. Lungs: clear Abdomen: obese, soft, nontender, nondistended. No hepatosplenomegaly. No bruits or masses. Good bowel sounds. Extremities: no cyanosis, clubbing, rash, edema Neuro: alert & orientedx3, cranial nerves grossly intact. moves all 4 extremities w/o difficulty. Affect pleasant    ASSESSMENT & PLAN: 1. Chronic HFrEF: Known  cardiomyopathy since 2019. Echo 02/27/2020 his admission with EF <20%, moderate LV dilation, severely decreased RV function with severe RV dilation, severe biatrial enlargement, mild MR. No history of ETOH/drugs. No FH of cardiomyopathy (mother with [place]). 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 Suspect he is too large for cardiac MRI. Narrow QRS on ECG so not CRT candidate. RHC/LHC on 03/04/20 with no CAD (nonischemic cardiomyopathy), elevated left and right heart filling pressures, and low CI at 2.1. 05/2020 Echo showed EF 20-25%. Saw EP but needs to lose weight for ICD.  - NYHA III. Stressed he needs to take torsemide as directed. Volume status stable. Continue torsemide to 80 mg daily. Refill torsemide today. Check BMET  -  Continue digoxin 0.125 daily. Dig level 0.2.  - Continue spironolactone 25 mg daily  - Continue entresto 97-103 twice a day  - We discussed adding Bidil but he wants to hold off. Asked him to check BP at home. We are going to send BP cuff to his house.  - Continue Farxiga 10 mg daily. No GU symptoms. - He did not tolerate Coreg (Updated allergies).  2. OSA: Hypercarbic respiratory failure in hospital.  -No longer using Bipap due to cost.  3. Obesity : Body mass index is 61.82 kg/m.   - [facility]erred to [facility] . He cancelled his appointment due to [place]. I have asked him to call for an appointment.  4. DM2: a1c 6.8, working on getting PCP. 5. PAF - Regular on exam.  -Keep off ivabradine given h/o A fib.  - Continue  elquis 5 mg twice a day. He has the medicine has been taking.  6. Hypokalemia Check BMET today.    Follow up with Dr [name] in 3 months. Will see if we can get BP  machine sent to his home. I have asked him to reschedule appointment with  [facility].   Needs PCP. Given list to PCP.   Check BMET today     Phillip Scarano  NP-C  12/24/2020

## 2020-12-24 ENCOUNTER — Encounter (HOSPITAL_COMMUNITY): Payer: BC Managed Care – PPO

## 2020-12-24 ENCOUNTER — Telehealth (HOSPITAL_COMMUNITY): Payer: Self-pay | Admitting: Licensed Clinical Social Worker

## 2020-12-24 ENCOUNTER — Other Ambulatory Visit: Payer: Self-pay

## 2020-12-24 ENCOUNTER — Encounter (HOSPITAL_COMMUNITY): Payer: Self-pay

## 2020-12-24 ENCOUNTER — Ambulatory Visit (HOSPITAL_COMMUNITY)
Admission: RE | Admit: 2020-12-24 | Discharge: 2020-12-24 | Disposition: A | Discharge status: Home / Self Care | Payer: BC Managed Care – PPO | Source: Ambulatory Visit | Attending: Adult Health | Admitting: Adult Health

## 2020-12-24 VITALS — BP 140/92 | HR 86 | Wt >= 6400 oz

## 2020-12-24 DIAGNOSIS — E119 Type 2 diabetes mellitus without complications: Secondary | ICD-10-CM | POA: Insufficient documentation

## 2020-12-24 DIAGNOSIS — Z87891 Personal history of nicotine dependence: Secondary | ICD-10-CM | POA: Insufficient documentation

## 2020-12-24 DIAGNOSIS — Z6841 Body Mass Index (BMI) 40.0 and over, adult: Secondary | ICD-10-CM | POA: Insufficient documentation

## 2020-12-24 DIAGNOSIS — I5082 Biventricular heart failure: Secondary | ICD-10-CM | POA: Diagnosis not present

## 2020-12-24 DIAGNOSIS — G4733 Obstructive sleep apnea (adult) (pediatric): Secondary | ICD-10-CM | POA: Insufficient documentation

## 2020-12-24 DIAGNOSIS — I480 Paroxysmal atrial fibrillation: Secondary | ICD-10-CM | POA: Diagnosis not present

## 2020-12-24 DIAGNOSIS — R0602 Shortness of breath: Secondary | ICD-10-CM | POA: Insufficient documentation

## 2020-12-24 DIAGNOSIS — I5022 Chronic systolic (congestive) heart failure: Secondary | ICD-10-CM | POA: Diagnosis not present

## 2020-12-24 DIAGNOSIS — Z7901 Long term (current) use of anticoagulants: Secondary | ICD-10-CM | POA: Insufficient documentation

## 2020-12-24 DIAGNOSIS — Z7984 Long term (current) use of oral hypoglycemic drugs: Secondary | ICD-10-CM | POA: Diagnosis not present

## 2020-12-24 DIAGNOSIS — I428 Other cardiomyopathies: Secondary | ICD-10-CM

## 2020-12-24 DIAGNOSIS — Z8249 Family history of ischemic heart disease and other diseases of the circulatory system: Secondary | ICD-10-CM | POA: Diagnosis not present

## 2020-12-24 DIAGNOSIS — Z79899 Other long term (current) drug therapy: Secondary | ICD-10-CM | POA: Insufficient documentation

## 2020-12-24 DIAGNOSIS — E876 Hypokalemia: Secondary | ICD-10-CM | POA: Insufficient documentation

## 2020-12-24 DIAGNOSIS — I110 Hypertensive heart disease with heart failure: Secondary | ICD-10-CM | POA: Diagnosis not present

## 2020-12-24 LAB — BASIC METABOLIC PANEL
Anion gap: 9 (ref 5–15)
BUN: 6 mg/dL (ref 6–20)
CO2: 28 mmol/L (ref 22–32)
Calcium: 9 mg/dL (ref 8.9–10.3)
Chloride: 100 mmol/L (ref 98–111)
Creatinine, Ser: 0.95 mg/dL (ref 0.61–1.24)
GFR, Estimated: >60 mL/min (ref >60)
Glucose, Bld: 118 mg/dL — ABNORMAL HIGH (ref 70–99)
Potassium: 3.5 mmol/L (ref 3.5–5.1)
Sodium: 137 mmol/L (ref 135–145)

## 2020-12-24 MED ORDER — TORSEMIDE 40 MG PO TABS: 80.0000 mg | ORAL_TABLET | Freq: Every day | ORAL | 6 refills | Status: DC

## 2020-12-24 NOTE — Telephone Encounter (Signed)
CSW referred to assist patient with obtaining a BP cuff. CSW contacted patient to inform cuff will be delivered to home. Patient grateful for support and assistance. CSW available as needed. Jackie Benita Boonstra, LCSW, CCSW-MCS [phone]  

## 2020-12-24 NOTE — Patient Instructions (Addendum)
Labs today We will only contact you if something comes back abnormal or we need to make some changes. Otherwise no news is good news!  We will order a Blood Pressure cuff for you and mail to your home   Record Blood pressure every day.  Please call us if blood pressure is too low or too high  We provided a list of primary care physicians.  Your physician recommends that you schedule a follow-up appointment in: 3 months with Dr [name]  Please call office at [phone] option [phone] if you have any questions or concerns.    At the [facility], you and your health needs are our priority. As part of our continuing mission to provide you with exceptional heart care, we have created designated Provider Care Teams. These Care Teams include your primary Cardiologist (physician) and Advanced Practice Providers (APPs- Physician Assistants and Nurse Practitioners) who all work together to provide you with the care you need, when you need it.   You may see any of the following providers on your designated Care Team at your next follow up: Marland Kitchen Dr Glori Bickers . Dr [name] . Dr Vickki Muff . Darrick Grinder, NP . Lyda Jester, [place] . Audry Riles, PharmD   Please be sure to bring in all your medications bottles to every appointment.

## 2021-01-16 DIAGNOSIS — I5023 Acute on chronic systolic (congestive) heart failure: Secondary | ICD-10-CM | POA: Diagnosis not present

## 2021-01-16 DIAGNOSIS — I5082 Biventricular heart failure: Secondary | ICD-10-CM | POA: Diagnosis not present

## 2021-02-05 ENCOUNTER — Telehealth (HOSPITAL_COMMUNITY): Payer: Self-pay | Admitting: Pharmacy Technician

## 2021-02-05 NOTE — Telephone Encounter (Signed)
Patient Advocate Encounter   Received notification from [employer] that prior authorization for Corlanor is required.   PA submitted on CoverMyMeds Key [id number] Status is pending   Will continue to follow.

## 2021-02-11 ENCOUNTER — Inpatient Hospital Stay (HOSPITAL_COMMUNITY)
Admission: EM | Admit: 2021-02-11 | Discharge: 2021-03-26 | DRG: 286 | Disposition: A | Discharge status: Home / Self Care | Payer: BC Managed Care – PPO | Attending: Cardiology | Admitting: Cardiology

## 2021-02-11 ENCOUNTER — Encounter (HOSPITAL_COMMUNITY): Payer: Self-pay | Admitting: Emergency Medicine

## 2021-02-11 ENCOUNTER — Emergency Department (HOSPITAL_COMMUNITY): Payer: BC Managed Care – PPO

## 2021-02-11 DIAGNOSIS — Z6841 Body Mass Index (BMI) 40.0 and over, adult: Secondary | ICD-10-CM

## 2021-02-11 DIAGNOSIS — N184 Chronic kidney disease, stage 4 (severe): Secondary | ICD-10-CM | POA: Diagnosis present

## 2021-02-11 DIAGNOSIS — I10 Essential (primary) hypertension: Secondary | ICD-10-CM | POA: Diagnosis present

## 2021-02-11 DIAGNOSIS — Z515 Encounter for palliative care: Secondary | ICD-10-CM | POA: Diagnosis not present

## 2021-02-11 DIAGNOSIS — R7303 Prediabetes: Secondary | ICD-10-CM | POA: Diagnosis present

## 2021-02-11 DIAGNOSIS — N170 Acute kidney failure with tubular necrosis: Secondary | ICD-10-CM | POA: Diagnosis not present

## 2021-02-11 DIAGNOSIS — T82818A Embolism of vascular prosthetic devices, implants and grafts, initial encounter: Secondary | ICD-10-CM | POA: Diagnosis not present

## 2021-02-11 DIAGNOSIS — Z9114 Patient's other noncompliance with medication regimen: Secondary | ICD-10-CM

## 2021-02-11 DIAGNOSIS — R059 Cough, unspecified: Secondary | ICD-10-CM | POA: Diagnosis not present

## 2021-02-11 DIAGNOSIS — R112 Nausea with vomiting, unspecified: Secondary | ICD-10-CM | POA: Diagnosis not present

## 2021-02-11 DIAGNOSIS — I248 Other forms of acute ischemic heart disease: Secondary | ICD-10-CM | POA: Diagnosis present

## 2021-02-11 DIAGNOSIS — J9691 Respiratory failure, unspecified with hypoxia: Secondary | ICD-10-CM

## 2021-02-11 DIAGNOSIS — R188 Other ascites: Secondary | ICD-10-CM | POA: Diagnosis present

## 2021-02-11 DIAGNOSIS — N179 Acute kidney failure, unspecified: Secondary | ICD-10-CM | POA: Diagnosis present

## 2021-02-11 DIAGNOSIS — L732 Hidradenitis suppurativa: Secondary | ICD-10-CM | POA: Diagnosis not present

## 2021-02-11 DIAGNOSIS — R509 Fever, unspecified: Secondary | ICD-10-CM | POA: Diagnosis not present

## 2021-02-11 DIAGNOSIS — I5023 Acute on chronic systolic (congestive) heart failure: Secondary | ICD-10-CM | POA: Diagnosis not present

## 2021-02-11 DIAGNOSIS — J9600 Acute respiratory failure, unspecified whether with hypoxia or hypercapnia: Secondary | ICD-10-CM

## 2021-02-11 DIAGNOSIS — R7989 Other specified abnormal findings of blood chemistry: Secondary | ICD-10-CM | POA: Diagnosis not present

## 2021-02-11 DIAGNOSIS — I471 Supraventricular tachycardia: Secondary | ICD-10-CM | POA: Diagnosis present

## 2021-02-11 DIAGNOSIS — J9690 Respiratory failure, unspecified, unspecified whether with hypoxia or hypercapnia: Secondary | ICD-10-CM | POA: Diagnosis not present

## 2021-02-11 DIAGNOSIS — Z79899 Other long term (current) drug therapy: Secondary | ICD-10-CM

## 2021-02-11 DIAGNOSIS — I517 Cardiomegaly: Secondary | ICD-10-CM | POA: Diagnosis not present

## 2021-02-11 DIAGNOSIS — I130 Hypertensive heart and chronic kidney disease with heart failure and stage 1 through stage 4 chronic kidney disease, or unspecified chronic kidney disease: Secondary | ICD-10-CM | POA: Diagnosis not present

## 2021-02-11 DIAGNOSIS — J811 Chronic pulmonary edema: Secondary | ICD-10-CM | POA: Diagnosis not present

## 2021-02-11 DIAGNOSIS — D696 Thrombocytopenia, unspecified: Secondary | ICD-10-CM | POA: Diagnosis not present

## 2021-02-11 DIAGNOSIS — I509 Heart failure, unspecified: Secondary | ICD-10-CM

## 2021-02-11 DIAGNOSIS — D7582 Heparin induced thrombocytopenia (HIT): Secondary | ICD-10-CM | POA: Diagnosis not present

## 2021-02-11 DIAGNOSIS — Y832 Surgical operation with anastomosis, bypass or graft as the cause of abnormal reaction of the patient, or of later complication, without mention of misadventure at the time of the procedure: Secondary | ICD-10-CM | POA: Diagnosis not present

## 2021-02-11 DIAGNOSIS — R570 Cardiogenic shock: Secondary | ICD-10-CM

## 2021-02-11 DIAGNOSIS — E8770 Fluid overload, unspecified: Secondary | ICD-10-CM | POA: Diagnosis present

## 2021-02-11 DIAGNOSIS — R34 Anuria and oliguria: Secondary | ICD-10-CM | POA: Diagnosis not present

## 2021-02-11 DIAGNOSIS — Z20822 Contact with and (suspected) exposure to covid-19: Secondary | ICD-10-CM | POA: Diagnosis present

## 2021-02-11 DIAGNOSIS — N390 Urinary tract infection, site not specified: Secondary | ICD-10-CM | POA: Diagnosis present

## 2021-02-11 DIAGNOSIS — Z452 Encounter for adjustment and management of vascular access device: Secondary | ICD-10-CM | POA: Diagnosis not present

## 2021-02-11 DIAGNOSIS — E8779 Other fluid overload: Secondary | ICD-10-CM | POA: Diagnosis not present

## 2021-02-11 DIAGNOSIS — E662 Morbid (severe) obesity with alveolar hypoventilation: Secondary | ICD-10-CM | POA: Diagnosis present

## 2021-02-11 DIAGNOSIS — R002 Palpitations: Secondary | ICD-10-CM | POA: Diagnosis present

## 2021-02-11 DIAGNOSIS — I4892 Unspecified atrial flutter: Secondary | ICD-10-CM | POA: Diagnosis not present

## 2021-02-11 DIAGNOSIS — Z87891 Personal history of nicotine dependence: Secondary | ICD-10-CM

## 2021-02-11 DIAGNOSIS — I5082 Biventricular heart failure: Secondary | ICD-10-CM | POA: Diagnosis not present

## 2021-02-11 DIAGNOSIS — Z992 Dependence on renal dialysis: Secondary | ICD-10-CM

## 2021-02-11 DIAGNOSIS — R0602 Shortness of breath: Secondary | ICD-10-CM

## 2021-02-11 DIAGNOSIS — Z9111 Patient's noncompliance with dietary regimen: Secondary | ICD-10-CM

## 2021-02-11 DIAGNOSIS — D509 Iron deficiency anemia, unspecified: Secondary | ICD-10-CM | POA: Diagnosis not present

## 2021-02-11 DIAGNOSIS — R0600 Dyspnea, unspecified: Secondary | ICD-10-CM

## 2021-02-11 DIAGNOSIS — I5021 Acute systolic (congestive) heart failure: Secondary | ICD-10-CM | POA: Diagnosis not present

## 2021-02-11 DIAGNOSIS — K047 Periapical abscess without sinus: Secondary | ICD-10-CM | POA: Diagnosis present

## 2021-02-11 DIAGNOSIS — I4891 Unspecified atrial fibrillation: Secondary | ICD-10-CM | POA: Diagnosis not present

## 2021-02-11 DIAGNOSIS — Z888 Allergy status to other drugs, medicaments and biological substances status: Secondary | ICD-10-CM

## 2021-02-11 DIAGNOSIS — R404 Transient alteration of awareness: Secondary | ICD-10-CM | POA: Diagnosis not present

## 2021-02-11 DIAGNOSIS — K5900 Constipation, unspecified: Secondary | ICD-10-CM | POA: Diagnosis present

## 2021-02-11 DIAGNOSIS — R000 Tachycardia, unspecified: Secondary | ICD-10-CM

## 2021-02-11 DIAGNOSIS — Z7189 Other specified counseling: Secondary | ICD-10-CM | POA: Diagnosis not present

## 2021-02-11 DIAGNOSIS — E1122 Type 2 diabetes mellitus with diabetic chronic kidney disease: Secondary | ICD-10-CM | POA: Diagnosis present

## 2021-02-11 DIAGNOSIS — Z813 Family history of other psychoactive substance abuse and dependence: Secondary | ICD-10-CM

## 2021-02-11 DIAGNOSIS — I420 Dilated cardiomyopathy: Secondary | ICD-10-CM | POA: Diagnosis present

## 2021-02-11 DIAGNOSIS — IMO0002 Reserved for concepts with insufficient information to code with codable children: Secondary | ICD-10-CM

## 2021-02-11 DIAGNOSIS — G4733 Obstructive sleep apnea (adult) (pediatric): Secondary | ICD-10-CM | POA: Diagnosis present

## 2021-02-11 DIAGNOSIS — E871 Hypo-osmolality and hyponatremia: Secondary | ICD-10-CM | POA: Diagnosis present

## 2021-02-11 DIAGNOSIS — R600 Localized edema: Secondary | ICD-10-CM | POA: Diagnosis not present

## 2021-02-11 DIAGNOSIS — J9601 Acute respiratory failure with hypoxia: Secondary | ICD-10-CM

## 2021-02-11 DIAGNOSIS — I4819 Other persistent atrial fibrillation: Secondary | ICD-10-CM | POA: Diagnosis not present

## 2021-02-11 DIAGNOSIS — K5901 Slow transit constipation: Secondary | ICD-10-CM | POA: Diagnosis not present

## 2021-02-11 DIAGNOSIS — K219 Gastro-esophageal reflux disease without esophagitis: Secondary | ICD-10-CM | POA: Diagnosis present

## 2021-02-11 DIAGNOSIS — Z8249 Family history of ischemic heart disease and other diseases of the circulatory system: Secondary | ICD-10-CM

## 2021-02-11 DIAGNOSIS — I483 Typical atrial flutter: Secondary | ICD-10-CM | POA: Diagnosis present

## 2021-02-11 DIAGNOSIS — Z66 Do not resuscitate: Secondary | ICD-10-CM | POA: Diagnosis not present

## 2021-02-11 DIAGNOSIS — R0603 Acute respiratory distress: Secondary | ICD-10-CM | POA: Diagnosis not present

## 2021-02-11 DIAGNOSIS — E875 Hyperkalemia: Secondary | ICD-10-CM | POA: Diagnosis present

## 2021-02-11 DIAGNOSIS — I480 Paroxysmal atrial fibrillation: Secondary | ICD-10-CM | POA: Diagnosis present

## 2021-02-11 DIAGNOSIS — Z9119 Patient's noncompliance with other medical treatment and regimen: Secondary | ICD-10-CM

## 2021-02-11 DIAGNOSIS — R109 Unspecified abdominal pain: Secondary | ICD-10-CM

## 2021-02-11 DIAGNOSIS — Z7901 Long term (current) use of anticoagulants: Secondary | ICD-10-CM

## 2021-02-11 DIAGNOSIS — R778 Other specified abnormalities of plasma proteins: Secondary | ICD-10-CM | POA: Diagnosis present

## 2021-02-11 DIAGNOSIS — Z5320 Procedure and treatment not carried out because of patient's decision for unspecified reasons: Secondary | ICD-10-CM | POA: Diagnosis not present

## 2021-02-11 DIAGNOSIS — Z4901 Encounter for fitting and adjustment of extracorporeal dialysis catheter: Secondary | ICD-10-CM | POA: Diagnosis not present

## 2021-02-11 DIAGNOSIS — Z95828 Presence of other vascular implants and grafts: Secondary | ICD-10-CM

## 2021-02-11 DIAGNOSIS — J9611 Chronic respiratory failure with hypoxia: Secondary | ICD-10-CM

## 2021-02-11 LAB — CBC WITH DIFFERENTIAL/PLATELET
Abs Immature Granulocytes: 0.04 10*3/uL (ref 0.00–0.07)
Basophils Absolute: 0.1 10*3/uL (ref 0.0–0.1)
Basophils Relative: 1 %
Eosinophils Absolute: 0.1 10*3/uL (ref 0.0–0.5)
Eosinophils Relative: 1 %
HCT: 42.9 % (ref 39.0–52.0)
Hemoglobin: 13.5 g/dL (ref 13.0–17.0)
Immature Granulocytes: 0 %
Lymphocytes Relative: 15 %
Lymphs Abs: 1.6 10*3/uL (ref 0.7–4.0)
MCH: 29.3 pg (ref 26.0–34.0)
MCHC: 31.5 g/dL (ref 30.0–36.0)
MCV: 93.1 fL (ref 80.0–100.0)
Monocytes Absolute: 0.5 10*3/uL (ref 0.1–1.0)
Monocytes Relative: 5 %
Neutro Abs: 8.1 10*3/uL — ABNORMAL HIGH (ref 1.7–7.7)
Neutrophils Relative %: 78 %
Platelets: 343 10*3/uL (ref 150–400)
RBC: 4.61 MIL/uL (ref 4.22–5.81)
RDW: 15 % (ref 11.5–15.5)
WBC: 10.3 10*3/uL (ref 4.0–10.5)
nRBC: 0 % (ref 0.0–0.2)

## 2021-02-11 LAB — COMPREHENSIVE METABOLIC PANEL
ALT: 52 U/L — ABNORMAL HIGH (ref 0–44)
AST: 44 U/L — ABNORMAL HIGH (ref 15–41)
Albumin: 3.4 g/dL — ABNORMAL LOW (ref 3.5–5.0)
Alkaline Phosphatase: 68 U/L (ref 38–126)
Anion gap: 11 (ref 5–15)
BUN: 10 mg/dL (ref 6–20)
CO2: 25 mmol/L (ref 22–32)
Calcium: 8.6 mg/dL — ABNORMAL LOW (ref 8.9–10.3)
Chloride: 99 mmol/L (ref 98–111)
Creatinine, Ser: 1.14 mg/dL (ref 0.61–1.24)
GFR, Estimated: >60 mL/min (ref >60)
Glucose, Bld: 127 mg/dL — ABNORMAL HIGH (ref 70–99)
Potassium: 3.6 mmol/L (ref 3.5–5.1)
Sodium: 135 mmol/L (ref 135–145)
Total Bilirubin: 2.1 mg/dL — ABNORMAL HIGH (ref 0.3–1.2)
Total Protein: 7.7 g/dL (ref 6.5–8.1)

## 2021-02-11 LAB — DIGOXIN LEVEL: Digoxin Level: <0.2 ng/mL — ABNORMAL LOW (ref 0.8–2.0)

## 2021-02-11 LAB — MAGNESIUM: Magnesium: 1.8 mg/dL (ref 1.7–2.4)

## 2021-02-11 LAB — LACTIC ACID, PLASMA: Lactic Acid, Venous: 1.9 mmol/L (ref 0.5–1.9)

## 2021-02-11 LAB — RESP PANEL BY RT-PCR (FLU A&B, COVID) ARPGX2
Influenza A by PCR: NEGATIVE
Influenza B by PCR: NEGATIVE
SARS Coronavirus 2 by RT PCR: NEGATIVE

## 2021-02-11 LAB — TROPONIN I (HIGH SENSITIVITY)
Troponin I (High Sensitivity): 131 ng/L (ref <18)
Troponin I (High Sensitivity): 146 ng/L (ref <18)

## 2021-02-11 LAB — TSH: TSH: 3.693 u[IU]/mL (ref 0.350–4.500)

## 2021-02-11 LAB — HEMOGLOBIN A1C
Hgb A1c MFr Bld: 6.5 % — ABNORMAL HIGH (ref 4.8–5.6)
Mean Plasma Glucose: 139.85 mg/dL

## 2021-02-11 LAB — BRAIN NATRIURETIC PEPTIDE: B Natriuretic Peptide: 194.3 pg/mL — ABNORMAL HIGH (ref 0.0–100.0)

## 2021-02-11 LAB — D-DIMER, QUANTITATIVE: D-Dimer, Quant: 0.59 ug/mL-FEU — ABNORMAL HIGH (ref 0.00–0.50)

## 2021-02-11 IMAGING — CR DG CHEST 2V
2 series · 2 of 2 positions shown · non-contrast
Comparison: [DATE]

CLINICAL DATA: shortness of breath and feeling like his heart is
racing.hx chf

EXAM:
CHEST - 2 VIEW

[chest pa]
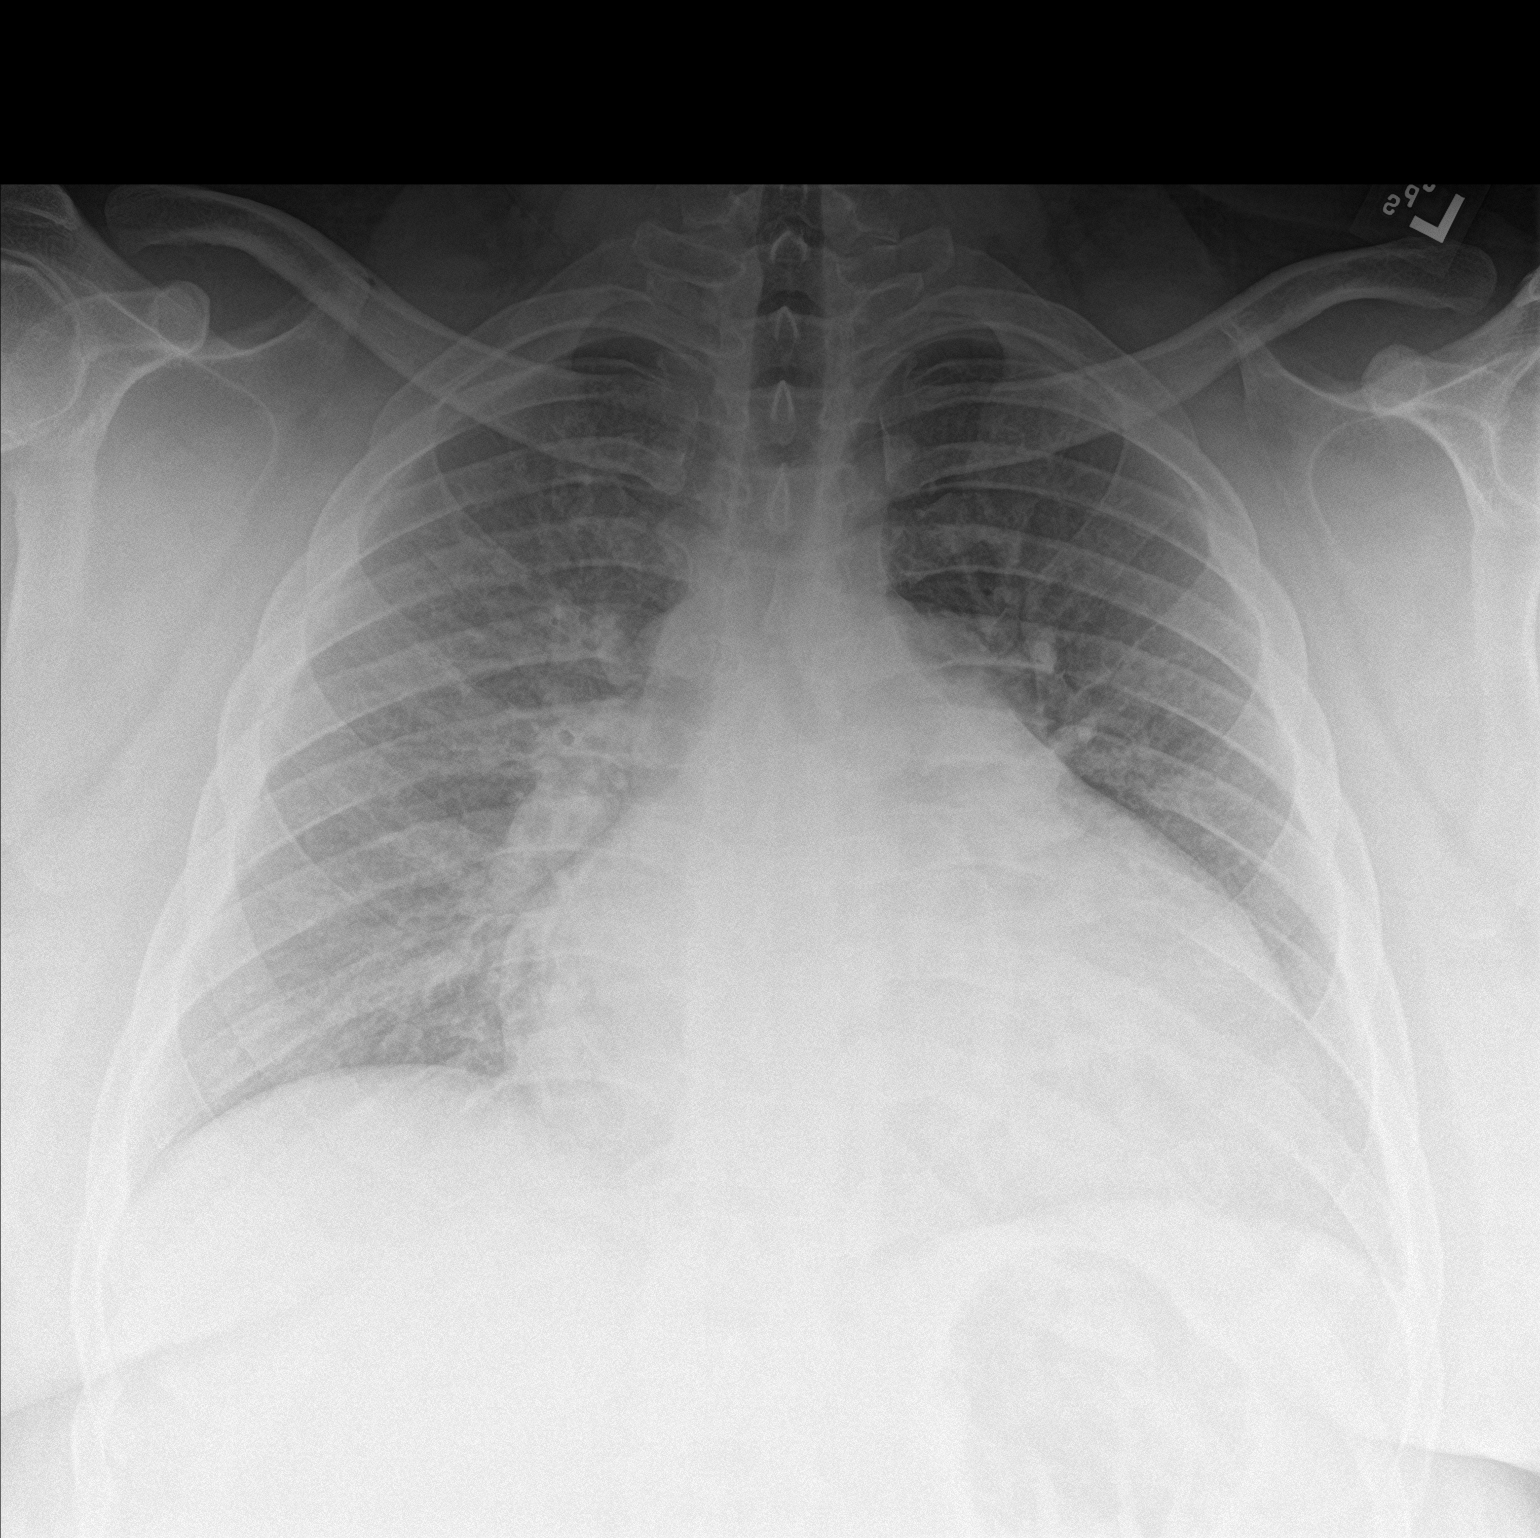

[chest lat]
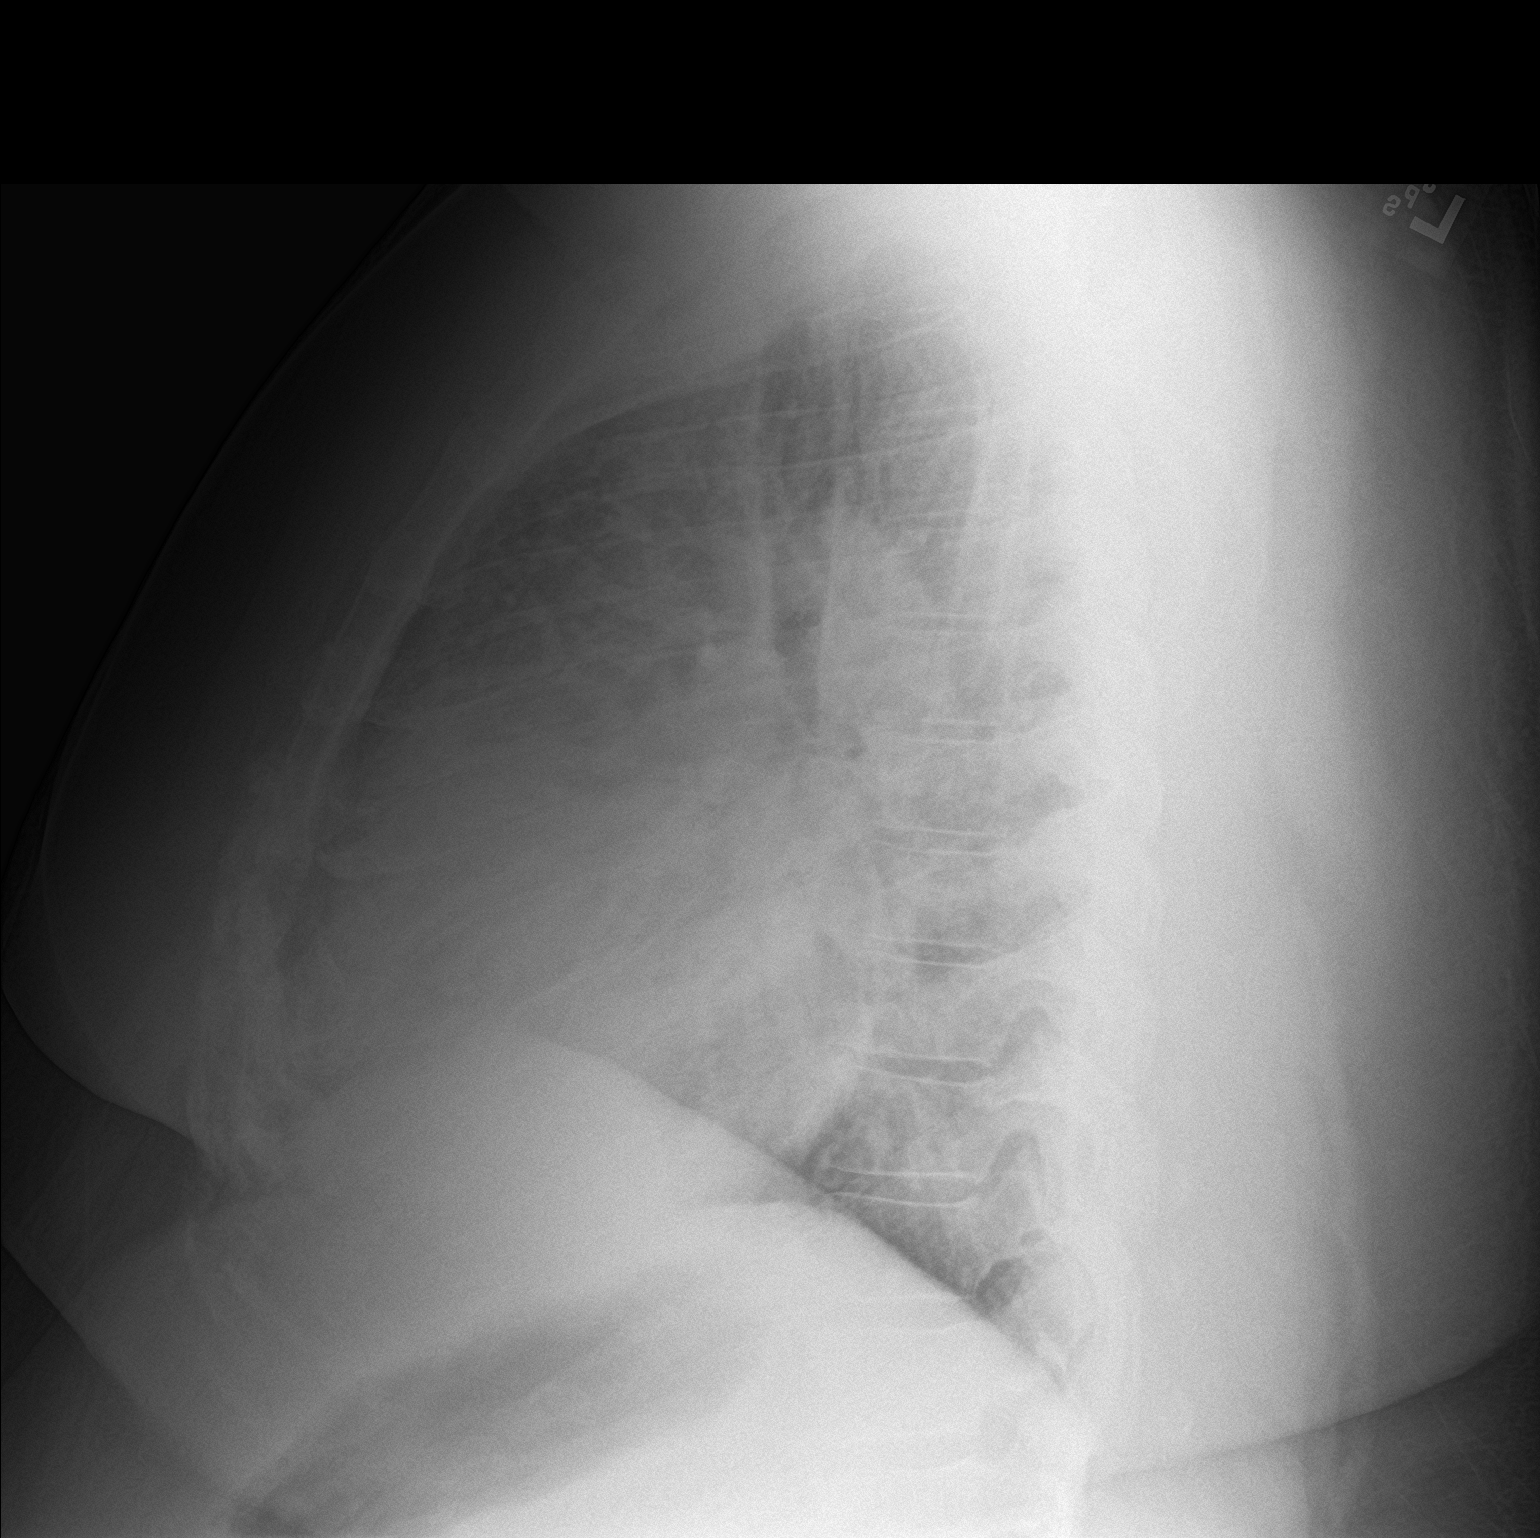

[2 of 2 positions shown; findings below may reference images not displayed]

FINDINGS: The central line has been removed. There are mild bilateral
interstitial infiltrates or edema, improved since previous.

Persistent cardiomegaly.

No effusion.  No pneumothorax.

Visualized bones unremarkable.
IMPRESSION: 1. Mild bilateral interstitial edema or infiltrates, improved since
previous

## 2021-02-11 MED ORDER — DILTIAZEM HCL 25 MG/5ML IV SOLN
15.0000 mg | Freq: Once | INTRAVENOUS | Status: DC
  Filled 2021-02-11: qty 5

## 2021-02-11 MED ORDER — AMIODARONE HCL IN DEXTROSE 360-4.14 MG/200ML-% IV SOLN
30.0000 mg/h | INTRAVENOUS | Status: DC
  Administered 2021-02-11: 30 mg/h via INTRAVENOUS
  Administered 2021-02-12: 60 mg/h via INTRAVENOUS
  Administered 2021-02-12: 30 mg/h via INTRAVENOUS
  Administered 2021-02-13 – 2021-02-15 (×13): 60 mg/h via INTRAVENOUS
  Administered 2021-02-16: 30 mg/h via INTRAVENOUS
  Administered 2021-02-16: 60 mg/h via INTRAVENOUS
  Administered 2021-02-16 – 2021-02-23 (×13): 30 mg/h via INTRAVENOUS
  Administered 2021-02-24: 60 mg/h via INTRAVENOUS
  Administered 2021-02-24 (×2): 30 mg/h via INTRAVENOUS
  Administered 2021-02-25 – 2021-02-26 (×5): 60 mg/h via INTRAVENOUS
  Administered 2021-02-26 – 2021-03-04 (×11): 30 mg/h via INTRAVENOUS
  Filled 2021-02-11 (×31): qty 200
  Filled 2021-02-11: qty 400
  Filled 2021-02-11 (×18): qty 200

## 2021-02-11 MED ORDER — ACETAMINOPHEN 325 MG PO TABS
650.0000 mg | ORAL_TABLET | Freq: Four times a day (QID) | ORAL | Status: DC | PRN
  Administered 2021-02-26 – 2021-03-17 (×5): 650 mg via ORAL
  Filled 2021-02-11 (×5): qty 2

## 2021-02-11 MED ORDER — SODIUM CHLORIDE 0.9 % IV SOLN
250.0000 mL | INTRAVENOUS | Status: DC | PRN
  Administered 2021-02-26: 250 mL via INTRAVENOUS

## 2021-02-11 MED ORDER — FUROSEMIDE 10 MG/ML IJ SOLN
80.0000 mg | Freq: Once | INTRAMUSCULAR | Status: AC
  Administered 2021-02-11: 80 mg via INTRAVENOUS
  Filled 2021-02-11: qty 8

## 2021-02-11 MED ORDER — AMIODARONE HCL IN DEXTROSE 360-4.14 MG/200ML-% IV SOLN
60.0000 mg/h | INTRAVENOUS | Status: DC
  Administered 2021-02-11: 60 mg/h via INTRAVENOUS
  Filled 2021-02-11 (×2): qty 200

## 2021-02-11 MED ORDER — AMIODARONE LOAD VIA INFUSION
150.0000 mg | Freq: Once | INTRAVENOUS | Status: AC
  Administered 2021-02-11: 150 mg via INTRAVENOUS
  Filled 2021-02-11: qty 83.34

## 2021-02-11 MED ORDER — SODIUM CHLORIDE 0.9% FLUSH
3.0000 mL | Freq: Two times a day (BID) | INTRAVENOUS | Status: DC
  Administered 2021-02-12 – 2021-03-10 (×19): 3 mL via INTRAVENOUS

## 2021-02-11 MED ORDER — SODIUM CHLORIDE 0.9% FLUSH: 3.0000 mL | INTRAVENOUS | Status: DC | PRN

## 2021-02-11 MED ORDER — ACETAMINOPHEN 650 MG RE SUPP: 650.0000 mg | Freq: Four times a day (QID) | RECTAL | Status: DC | PRN

## 2021-02-11 NOTE — ED Notes (Signed)
Report given to rn on 6e 

## 2021-02-11 NOTE — ED Provider Notes (Addendum)
Assumed care of patient at 3 PM.  Here with shortness of breath, hypoxia, atrial flutter with RVR.  He is on amiodarone, digoxin, Eliquis.  He does admit to some missed doses of Eliquis and may be some of his other meds.  Patient had chest x-ray that shows fluid but improved from prior EKGs.  BNP and troponin mildly elevated.  D-dimer mildly elevated but have low suspicion for PE but will talk to medicine if they want to pursue a CT.  Have low concern for infectious process.  Talked with Dr. [name] with cardiology and overall given his obesity, possibly some missed doses of Eliquis do not think is a great candidate for cardioversion or sedation at this time.  Patient does not want to pursue cardioversion at this time as well and overall we will start patient on amiodarone bolus and infusion.  Cardiology will see but prefer admission to medicine at this time.  We will give him a dose IV Lasix as well as suspect there is component of fluid overload causing his shortness of breath and hypoxia as well.  To be admitted to medicine for further care.  This chart was dictated using voice recognition software.  Despite best efforts to proofread,  errors can occur which can change the documentation meaning.  .Critical Care  Date/Time: 02/11/2021 4:57 PM Performed by: Lennice Sites, DO Authorized by: Lennice Sites, DO   Critical care provider statement:    Critical care time (minutes):  35   Critical care was necessary to treat or prevent imminent or life-threatening deterioration of the following conditions: atrial flutter with rvr.   Critical care was time spent personally by me on the following activities:  Blood draw for specimens, development of treatment plan with patient or surrogate, discussions with consultants, discussions with primary provider, evaluation of patient's response to treatment, examination of patient, obtaining history from patient or surrogate, ordering and performing treatments and  interventions, ordering and review of laboratory studies, re-evaluation of patient's condition, ordering and review of radiographic studies, pulse oximetry and review of old charts   I assumed direction of critical care for this patient from another provider in my specialty: no     Care discussed with: admitting provider      Lennice Sites, DO 02/11/21 [place], [name], DO 02/11/21 1658

## 2021-02-11 NOTE — ED Notes (Signed)
hospitalist paged about the pt not having a urine ourout after 80 of lasix  Bladder scan ordered

## 2021-02-11 NOTE — ED Triage Notes (Signed)
Patient was started on amoxicillin last Thursday in preparation for tooth extraction, reports shortness of breath and feeling like his heart is racing. Patient also reports new diarrhea this week. Patient alert, oriented, and in no apparent distress at this time.

## 2021-02-11 NOTE — ED Provider Notes (Signed)
[facility] EMERGENCY DEPARTMENT Provider Note   CSN: [id number] Arrival date & time: 02/11/21  1156     History Chief Complaint  Patient presents with   Shortness of Breath    Phillip Young is a 35 y.o. male.   Shortness of Breath  Patient presents to the ED with complaints of shortness of breath and palpitations.  Patient does have history of CHF and morbid obesity.  Patient states he started having his symptoms several days ago.  Patient started taking amoxicillin last Thursday in preparation for tooth extraction.  He started to have some episodes of loose stools.  Patient feels like the breathing issues started after that.  He has not had any fevers or chills.  He has not had any body aches.  Patient does have history of congestive heart failure and felt that he had similar symptoms when he was first diagnosed.  Past Medical History:  Diagnosis Date   Biventricular congestive heart failure ([place])    Last Echo 11/2019 at [facility] reveals EF 20%   Class 3 severe obesity due to excess calories with serious comorbidity and body mass index (BMI) of 50.0 to 59.9 in adult [facility]) 02/26/2020   Essential hypertension 02/26/2020   GERD without esophagitis 02/26/2020   Hidradenitis suppurativa 02/26/2020   OSA (obstructive sleep apnea) 02/26/2020   Prediabetes 02/26/2020    Patient Active Problem List   Diagnosis Date Noted   CAP (community acquired pneumonia) 10/08/2020   Acute on chronic systolic congestive heart failure ([name])    Hypokalemia    Biventricular heart failure ([name])    Chronic cough    Acute on chronic systolic (congestive) heart failure ([place]) 02/26/2020   Morbid obesity with body mass index (BMI) of 60.0 to 69.9 in adult [facility]) 02/26/2020   Essential hypertension 02/26/2020   GERD without esophagitis 02/26/2020   Diuretic-induced hypokalemia 02/26/2020   OSA (obstructive sleep apnea) 02/26/2020   Hidradenitis suppurativa 02/26/2020   Prediabetes 02/26/2020     Past Surgical History:  Procedure Laterality Date   ABSCESS DRAINAGE     RIGHT/LEFT HEART CATH AND CORONARY ANGIOGRAPHY N/A 03/04/2020   Procedure: RIGHT/LEFT HEART CATH AND CORONARY ANGIOGRAPHY;  Surgeon: Larey Dresser, MD;  Location: [place] CV LAB;  Service: Cardiovascular;  Laterality: N/A;       Family History  Problem Relation Age of Onset   Heart disease Mother    Hypertension Mother    Pulmonary Hypertension Mother    Drug abuse Father        died due to Heroin overdose    Social History   Tobacco Use   Smoking status: Former   Smokeless tobacco: Former   Tobacco comments:    quit in 2019  Substance Use Topics   Alcohol use: Never   Drug use: Never    Home Medications Prior to Admission medications   Medication Sig Start Date End Date Taking? Authorizing Provider  amiodarone (PACERONE) 200 MG tablet Take 80 mg by mouth daily. 12/18/20  Yes [provider]  amoxicillin (AMOXIL) 500 MG capsule Take 500 mg by mouth 3 (three) times daily. 02/07/21  Yes [provider]  apixaban (ELIQUIS) 5 MG TABS tablet Take 1 tablet (5 mg total) by mouth 2 (two) times daily. 11/07/20  Yes Clegg, Amy D, NP  dapagliflozin propanediol (FARXIGA) 10 MG TABS tablet Take 1 tablet (10 mg total) by mouth daily. 04/16/20  Yes Bensimhon, Shaune Pascal, MD  digoxin (LANOXIN) 0.125 MG tablet  Take 1 tablet (0.125 mg total) by mouth daily. 04/16/20  Yes Bensimhon, Shaune Pascal, MD  Potassium Chloride ER 20 MEQ TBCR Take 40 mEq by mouth 2 (two) times daily. 11/21/20 02/11/21 Yes Bensimhon, Shaune Pascal, MD  sacubitril-valsartan (ENTRESTO) 97-103 MG Take 1 tablet by mouth 2 (two) times daily.   Yes [provider]  spironolactone (ALDACTONE) 25 MG tablet TAKE 1 TABLET(25 MG) BY MOUTH DAILY Patient taking differently: Take 25 mg by mouth daily. TAKE 1 TABLET(25 MG) BY MOUTH DAILY 12/23/20  Yes Bensimhon, Shaune Pascal, MD  Torsemide 40 MG TABS Take 80 mg by mouth daily. 12/24/20  Yes Clegg,  Amy D, NP    Allergies    Coreg [carvedilol]  Review of Systems   Review of Systems  Respiratory:  Positive for shortness of breath.   All other systems reviewed and are negative.  Physical Exam Updated Vital Signs BP 134/61   Pulse (!) 135   Temp 98.6 F (37 C)   Resp (!) 31   SpO2 97%   Physical Exam Vitals and nursing note reviewed.  Constitutional:      Appearance: He is well-developed. He is obese.  HENT:     Head: Normocephalic and atraumatic.     Right Ear: External ear normal.     Left Ear: External ear normal.  Eyes:     General: No scleral icterus.       Right eye: No discharge.        Left eye: No discharge.     Conjunctiva/sclera: Conjunctivae normal.  Neck:     Trachea: No tracheal deviation.  Cardiovascular:     Rate and Rhythm: Regular rhythm. Tachycardia present.  Pulmonary:     Effort: Pulmonary effort is normal. No respiratory distress.     Breath sounds: Normal breath sounds. No stridor. No wheezing or rales.  Abdominal:     General: Bowel sounds are normal. There is no distension.     Palpations: Abdomen is soft.     Tenderness: There is no abdominal tenderness. There is no guarding or rebound.  Musculoskeletal:        General: No tenderness or deformity.     Cervical back: Neck supple.     Right lower leg: Edema present.     Left lower leg: Edema present.  Skin:    General: Skin is warm and dry.     Findings: No rash.  Neurological:     General: No focal deficit present.     Mental Status: He is alert.     Cranial Nerves: No cranial nerve deficit (no facial droop, extraocular movements intact, no slurred speech).     Sensory: No sensory deficit.     Motor: No abnormal muscle tone or seizure activity.     Coordination: Coordination normal.  Psychiatric:        Mood and Affect: Mood normal.    ED Results / Procedures / Treatments   Labs (all labs ordered are listed, but only abnormal results are displayed) Labs Reviewed   COMPREHENSIVE METABOLIC PANEL - Abnormal; Notable for the following components:      Result Value   Glucose, Bld 127 (*)    Calcium 8.6 (*)    Albumin 3.4 (*)    AST 44 (*)    ALT 52 (*)    Total Bilirubin 2.1 (*)    All other components within normal limits  CBC WITH DIFFERENTIAL/PLATELET - Abnormal; Notable for the following components:   Neutro  Abs 8.1 (*)    All other components within normal limits  D-DIMER, QUANTITATIVE - Abnormal; Notable for the following components:   D-Dimer, Quant 0.59 (*)    All other components within normal limits  RESP PANEL BY RT-PCR (FLU A&B, COVID) ARPGX2  LACTIC ACID, PLASMA  URINALYSIS, ROUTINE W REFLEX MICROSCOPIC  BRAIN NATRIURETIC PEPTIDE  DIGOXIN LEVEL  TROPONIN I (HIGH SENSITIVITY)    EKG EKG Interpretation  Date/Time:  Wednesday February 11 2021 12:14:52 EDT Ventricular Rate:  135 PR Interval:    QRS Duration: 78 QT Interval:  374 QTC Calculation: 561 R Axis:   114 Text Interpretation: Atrial flutter with 2:1 A-V conduction Right axis deviation Pulmonary disease pattern Nonspecific ST and T wave abnormality Abnormal ECG Confirmed by Lennice Sites ([id number]) on 02/11/2021 3:45:35 PM  Radiology DG Chest 2 View  Result Date: 02/11/2021 CLINICAL DATA:  shortness of breath and feeling like his heart is racing.hx chf EXAM: CHEST - 2 VIEW COMPARISON:  10/10/2020 FINDINGS: The central line has been removed. There are mild bilateral interstitial infiltrates or edema, improved since previous. Persistent cardiomegaly. No effusion.  No pneumothorax. Visualized bones unremarkable. IMPRESSION: 1. Mild bilateral interstitial edema or infiltrates, improved since previous Electronically Signed   By: Lucrezia Europe M.D.   On: 02/11/2021 13:32    Procedures Procedures   Medications Ordered in ED Medications  diltiazem (CARDIZEM) injection 15 mg (has no administration in time range)    ED Course  I have reviewed the triage vital signs and the nursing  notes.  Pertinent labs & imaging results that were available during my care of the patient were reviewed by me and considered in my medical decision making (see chart for details).  Clinical Course as of 02/11/21 1551  Wed Feb 11, 2021  1442 CBC normal.  Metabolic panel normal.  Lactic acid level normal. [JK]  1443 Chest x-ray shows mild interstitial edema [JK]    Clinical Course User Index [JK] Dorie Rank, MD   MDM Rules/Calculators/A&P                           Patient presented to ED for evaluation of tachycardia and shortness of breath.  Patient noted to be tachycardic here in the ED 130s.  He denies any fever.  Sepsis was a consideration but I doubt based on his presentation.  No signs of infection.  More concerned about the possibility of CHF and possible atrial flutter with a 2-1 block.  We will add on BNP and troponins to check for heart failure.  COVID test has also been ordered.  We will give a dose of Cardizem.  Care turned over to Dr Ronnald Nian. Final Clinical Impression(s) / ED Diagnoses Final diagnoses:  SOB (shortness of breath)  Tachycardia       Dorie Rank, MD 02/11/21 [id number]

## 2021-02-11 NOTE — ED Notes (Signed)
Pt Sa02 88% on RA, placed pt on 2L 02 per [place]

## 2021-02-11 NOTE — ED Notes (Signed)
Unsuccessful attempt to call report rn on [phone] supposed to call me back

## 2021-02-11 NOTE — H&P (Signed)
History and Physical    Phillip Young [id number] DOB: 31-May-1986 DOA: 02/11/2021  PCP: Patient, No Pcp Per (Inactive) Consultants:  cardiology: Dr. Curt Bears Dr. Sung Amabile Patient coming from:  Home - lives with uncle    Chief Complaint: shortness of breath and palpitations.   HPI: Phillip Young is a 35 y.o. male with medical history significant of biventricular heart failure, morbid obesity, HTN, GERD, OSA, prediabetes who presented with shortness of breath and palpitations x 1 week. He also was on amoxicillin for a tooth extraction and had some diarrhea and feels like his heart rate started to jump up when he started to take this medication. He checked his pulse ox at home and his oxygen was 68% and heart rate was 150. This was 2 days ago.  He went to work the next day and felt bad and decided to come to the ER. He also has had a cough that started a few days ago.  Cough is dry in nature. His weight goes up and down. He denies any leg swelling, but states his stomach seems more swollen. He has orthopnea and can not lie down flat. He denies any fever, has had some chills. Diarrhea has resolved. He denies any chest pain. Does not use cpap at night due to cost.   He does occasionally miss a dose of his medication, but states he typically takes his medication.     ED Course: afebrile, bp: 112/91, HR: 131, RR: 26, oxygen: 88-91% room air-->2L 94%. Ekg with atrial flutter with RVR with rate of 136. Pertinent labs: BNP: 194 Troponin: 131--->pending D-dimer: .59. CXR shows cardiomegaly, but improved fluid. Cardiology consulted and amiodarone drip started. Given '80mg'$  IV lasix x 1 dose. Asked to admit.   Review of Systems: As per HPI; otherwise review of systems reviewed and negative.   Ambulatory Status:  Ambulates without assistance    Past Medical History:  Diagnosis Date   Biventricular congestive heart failure ([place])    Last Echo 11/2019 at [facility] reveals EF 20%   Class 3 severe  obesity due to excess calories with serious comorbidity and body mass index (BMI) of 50.0 to 59.9 in adult [facility]) 02/26/2020   Essential hypertension 02/26/2020   GERD without esophagitis 02/26/2020   Hidradenitis suppurativa 02/26/2020   OSA (obstructive sleep apnea) 02/26/2020   Prediabetes 02/26/2020    Past Surgical History:  Procedure Laterality Date   ABSCESS DRAINAGE     RIGHT/LEFT HEART CATH AND CORONARY ANGIOGRAPHY N/A 03/04/2020   Procedure: RIGHT/LEFT HEART CATH AND CORONARY ANGIOGRAPHY;  Surgeon: Larey Dresser, MD;  Location: [place] [facility];  Service: Cardiovascular;  Laterality: N/A;    Social History   Socioeconomic History   Marital status: Unknown    Spouse name: Not on file   Number of children: Not on file   Years of education: Not on file   Highest education level: Not on file  Occupational History   Not on file  Tobacco Use   Smoking status: Former   Smokeless tobacco: Former   Tobacco comments:    quit in 2019  Substance and Sexual Activity   Alcohol use: Never   Drug use: Never   Sexual activity: Not on file  Other Topics Concern   Not on file  Social History Narrative   Not on file   Social Determinants of Health   Financial Resource Strain: Not on file  Food Insecurity: Not on file  Transportation Needs: Not on file  Physical  Activity: Not on file  Stress: Not on file  Social Connections: Not on file  Intimate Partner Violence: Not on file    Allergies  Allergen Reactions   Coreg [Carvedilol] Shortness Of Breath and Diarrhea    Wheezing     Family History  Problem Relation Age of Onset   Heart disease Mother    Hypertension Mother    Pulmonary Hypertension Mother    Drug abuse Father        died due to Heroin overdose    Prior to Admission medications   Medication Sig Start Date End Date Taking? Authorizing Provider  amiodarone (PACERONE) 200 MG tablet Take 80 mg by mouth daily. 12/18/20  Yes [provider]  amoxicillin  (AMOXIL) 500 MG capsule Take 500 mg by mouth 3 (three) times daily. 02/07/21  Yes [provider]  apixaban (ELIQUIS) 5 MG TABS tablet Take 1 tablet (5 mg total) by mouth 2 (two) times daily. 11/07/20  Yes Clegg, Amy D, NP  dapagliflozin propanediol (FARXIGA) 10 MG TABS tablet Take 1 tablet (10 mg total) by mouth daily. 04/16/20  Yes Bensimhon, Shaune Pascal, MD  digoxin (LANOXIN) 0.125 MG tablet Take 1 tablet (0.125 mg total) by mouth daily. 04/16/20  Yes Bensimhon, Shaune Pascal, MD  Potassium Chloride ER 20 MEQ TBCR Take 40 mEq by mouth 2 (two) times daily. 11/21/20 02/11/21 Yes Bensimhon, Shaune Pascal, MD  sacubitril-valsartan (ENTRESTO) 97-103 MG Take 1 tablet by mouth 2 (two) times daily.   Yes [provider]  spironolactone (ALDACTONE) 25 MG tablet TAKE 1 TABLET(25 MG) BY MOUTH DAILY Patient taking differently: Take 25 mg by mouth daily. TAKE 1 TABLET(25 MG) BY MOUTH DAILY 12/23/20  Yes Bensimhon, Shaune Pascal, MD  Torsemide 40 MG TABS Take 80 mg by mouth daily. 12/24/20  Yes Clegg, Amy D, NP    Physical Exam: Vitals:   02/11/21 1340 02/11/21 1430 02/11/21 1500 02/11/21 1720  BP: (!) 150/93 (!) 123/45 134/61 (!) 155/90  Pulse: (!) 137 (!) 138 (!) 135 (!) 134  Resp: (!) 22 19 (!) 31 (!) 34  Temp:      SpO2: 92% 94% 97% 98%     General:  Appears calm and comfortable and is in NAD Eyes:  PERRL, EOMI, normal lids, iris ENT:  grossly normal hearing, lips & tongue, mmm; appropriate dentition Neck:  no LAD, masses or thyromegaly; no carotid bruits Cardiovascular: tachycardic, but regular, no m/r/g. Trace LE edema.  Respiratory:   CTA bilaterally with no wheezes/rales/rhonchi.  Normal respiratory effort. Abdomen:  soft, NT, ND, NABS Back:   normal alignment, no CVAT Skin:  no rash or induration seen on limited exam Musculoskeletal:  grossly normal tone BUE/BLE, good ROM, no bony abnormality Lower extremity:  trace LE edema.  Limited foot exam with no ulcerations.  2+ distal  pulses. Psychiatric:  grossly normal mood and affect, speech fluent and appropriate, AOx3 Neurologic:  CN 2-12 grossly intact, moves all extremities in coordinated fashion, sensation intact    Radiological Exams on Admission: Independently reviewed - see discussion in A/P where applicable  DG Chest 2 View  Result Date: 02/11/2021 CLINICAL DATA:  shortness of breath and feeling like his heart is racing.hx chf EXAM: CHEST - 2 VIEW COMPARISON:  10/10/2020 FINDINGS: The central line has been removed. There are mild bilateral interstitial infiltrates or edema, improved since previous. Persistent cardiomegaly. No effusion.  No pneumothorax. Visualized bones unremarkable. IMPRESSION: 1. Mild bilateral interstitial edema or infiltrates, improved since previous Electronically  Signed   By: Lucrezia Europe M.D.   On: 02/11/2021 13:32    EKG: Independently reviewed.  Atrial flutter with rate of 135; nonspecific ST changes with no evidence of acute ischemia   Labs on Admission: I have personally reviewed the available labs and imaging studies at the time of the admission.  Pertinent labs:  BNP: 194 Troponin: 131--->146 D-dimer: .59  Assessment/Plan Principal Problem:   Atrial flutter with rapid ventricular response [facility]) -cardiology consulted in ER -started on amiodarone bolus/drip and they will see -continued his digoxin, level pending -continue eliquis and tele  -likely not a candidate for cardioversion with his weight and missed doses of medication but will f/u with cards recommendation  -TSH pending   Active Problems:   Acute on chronic systolic (congestive) heart failure ([name]) -biventricular heart failure, CXR improved and BNP slightly elevated. Likely secondary to atrial flutter -took AM dose of oral toresomide and given '80mg'$  IV lasix in ER -will see how he responds and adjust as needed. Held home diuretics, continued oral potassium replacement.  -strict I/O and daily weights -continued  entresto, farxiga, digoxin and spironolactone  -digoxin level pending  -last echo in 11/21 with EF of 20-25%. Severely decreased LVF and global hypokinesis. Grade II diastolic dysfunction.  -repeat echo pending  -f/u on cards recommendations  Elevated troponin -likely secondary from demand ischemia from atrial flutter with RVR -no ST elevation, chest pain and heart cath in 02/2020 with no significant CAD.  -continue tele and echo pending -cardiology to see   Respiratory failure with hypoxia -needing 2L oxygen, maintaining saturation -likely secondary to atrial flutter +CHF  -wean as tolerated   Elevated d-dimer -low probability of DVT, taking eliquis -no edematous LE -hold CTA at this time.     OSA (obstructive sleep apnea) -noncompliant with cpap at home due to cost -cpap ordered while in hospital -social work consult to help with getting supplies     Essential hypertension -has been well controlled -continue home meds    Prediabetes A1c in 09/2020, will repeat today    Morbid obesity with body mass index (BMI) of 60.0 to 69.9 in adult [facility]) -encourage weight loss and lifestyle changes   Continued amoxicillin x 2 more days for his 7 day course.     There is no height or weight on file to calculate BMI.  Note: This patient has been tested and is negative for the novel coronavirus COVID-19.   Level of care: Progressive DVT prophylaxis:  eliquis Code Status:  Full - confirmed with patient Family Communication: None present Disposition Plan:  The patient is from: home  Anticipated d/c is to: home without [facility] services once his cardiology issues have been resolved. Felt he meets inpatient criteria with new arrhyhtmia requiring a drip for rate control, respiratory failure requiring oxygen.    Consults called: cardiology, Dr. Marigene Ehlers by EDP  Admission status:  inpatient    Orma Flaming MD [facility]   How to contact the [facility] Attending or Consulting provider  [place] or covering provider during after hours [place], for this patient?  Check the care team in [facility] and look for a) attending/consulting TRH provider listed and b) the [facility] team listed Log into [email or web address] and use [place]'s universal password to access. If you do not have the password, please contact the hospital operator. Locate the [facility] provider you are looking for under [facility] and page to a number that you can be directly reached. If you  still have difficulty reaching the provider, please page the [facility] (Director on Call) for the Hospitalists listed on amion for assistance.   02/11/2021, 8:17 PM

## 2021-02-12 ENCOUNTER — Inpatient Hospital Stay: Payer: Self-pay

## 2021-02-12 ENCOUNTER — Encounter (HOSPITAL_COMMUNITY): Payer: Self-pay | Admitting: Family Medicine

## 2021-02-12 ENCOUNTER — Inpatient Hospital Stay (HOSPITAL_COMMUNITY): Payer: BC Managed Care – PPO

## 2021-02-12 DIAGNOSIS — I5021 Acute systolic (congestive) heart failure: Secondary | ICD-10-CM

## 2021-02-12 DIAGNOSIS — I5023 Acute on chronic systolic (congestive) heart failure: Secondary | ICD-10-CM | POA: Diagnosis not present

## 2021-02-12 LAB — COMPREHENSIVE METABOLIC PANEL
ALT: 47 U/L — ABNORMAL HIGH (ref 0–44)
AST: 35 U/L (ref 15–41)
Albumin: 3.4 g/dL — ABNORMAL LOW (ref 3.5–5.0)
Alkaline Phosphatase: 69 U/L (ref 38–126)
Anion gap: 12 (ref 5–15)
BUN: 13 mg/dL (ref 6–20)
CO2: 23 mmol/L (ref 22–32)
Calcium: 8.8 mg/dL — ABNORMAL LOW (ref 8.9–10.3)
Chloride: 97 mmol/L — ABNORMAL LOW (ref 98–111)
Creatinine, Ser: 1.24 mg/dL (ref 0.61–1.24)
GFR, Estimated: >60 mL/min (ref >60)
Glucose, Bld: 144 mg/dL — ABNORMAL HIGH (ref 70–99)
Potassium: 3.6 mmol/L (ref 3.5–5.1)
Sodium: 132 mmol/L — ABNORMAL LOW (ref 135–145)
Total Bilirubin: 1.9 mg/dL — ABNORMAL HIGH (ref 0.3–1.2)
Total Protein: 7.9 g/dL (ref 6.5–8.1)

## 2021-02-12 LAB — URINALYSIS, ROUTINE W REFLEX MICROSCOPIC
Bilirubin Urine: NEGATIVE
Glucose, UA: NEGATIVE mg/dL
Hgb urine dipstick: NEGATIVE
Ketones, ur: NEGATIVE mg/dL
Nitrite: NEGATIVE
Protein, ur: 100 mg/dL — AB
Specific Gravity, Urine: 1.023 (ref 1.005–1.030)
WBC, UA: >50 WBC/hpf — ABNORMAL HIGH (ref 0–5)
pH: 5 (ref 5.0–8.0)

## 2021-02-12 LAB — ECHOCARDIOGRAM COMPLETE
Height: 71 in
S' Lateral: 6.7 cm
Weight: 7601.6 oz

## 2021-02-12 MED ORDER — METOLAZONE 5 MG PO TABS
5.0000 mg | ORAL_TABLET | Freq: Once | ORAL | Status: AC
  Administered 2021-02-13: 5 mg via ORAL
  Filled 2021-02-12: qty 1

## 2021-02-12 MED ORDER — FUROSEMIDE 10 MG/ML IJ SOLN
80.0000 mg | Freq: Once | INTRAMUSCULAR | Status: AC
  Administered 2021-02-12: 80 mg via INTRAVENOUS
  Filled 2021-02-12: qty 8

## 2021-02-12 MED ORDER — AMOXICILLIN 500 MG PO CAPS
500.0000 mg | ORAL_CAPSULE | Freq: Three times a day (TID) | ORAL | Status: DC
  Administered 2021-02-12 – 2021-02-13 (×3): 500 mg via ORAL
  Filled 2021-02-12 (×4): qty 1

## 2021-02-12 MED ORDER — POTASSIUM CHLORIDE CRYS ER 20 MEQ PO TBCR
40.0000 meq | EXTENDED_RELEASE_TABLET | Freq: Two times a day (BID) | ORAL | Status: DC
  Administered 2021-02-12 – 2021-02-13 (×3): 40 meq via ORAL
  Filled 2021-02-12 (×3): qty 2

## 2021-02-12 MED ORDER — SACUBITRIL-VALSARTAN 97-103 MG PO TABS
1.0000 | ORAL_TABLET | Freq: Two times a day (BID) | ORAL | Status: DC
  Administered 2021-02-12 (×2): 1 via ORAL
  Filled 2021-02-12 (×4): qty 1

## 2021-02-12 MED ORDER — PERFLUTREN LIPID MICROSPHERE
1.0000 mL | INTRAVENOUS | Status: AC | PRN
  Administered 2021-02-12: 4 mL via INTRAVENOUS
  Filled 2021-02-12: qty 10

## 2021-02-12 MED ORDER — DAPAGLIFLOZIN PROPANEDIOL 10 MG PO TABS
10.0000 mg | ORAL_TABLET | Freq: Every day | ORAL | Status: DC
  Administered 2021-02-12: 10 mg via ORAL
  Filled 2021-02-12 (×2): qty 1

## 2021-02-12 MED ORDER — SPIRONOLACTONE 25 MG PO TABS
25.0000 mg | ORAL_TABLET | Freq: Every day | ORAL | Status: DC
  Administered 2021-02-12: 25 mg via ORAL
  Filled 2021-02-12: qty 1

## 2021-02-12 MED ORDER — DIGOXIN 125 MCG PO TABS
0.1250 mg | ORAL_TABLET | Freq: Every day | ORAL | Status: DC
  Administered 2021-02-12: 0.125 mg via ORAL
  Filled 2021-02-12: qty 1

## 2021-02-12 MED ORDER — DOBUTAMINE IN D5W 4-5 MG/ML-% IV SOLN
6.0000 ug/kg/min | INTRAVENOUS | Status: DC
  Administered 2021-02-12: 2.5 ug/kg/min via INTRAVENOUS
  Administered 2021-02-13 – 2021-02-23 (×14): 5 ug/kg/min via INTRAVENOUS
  Administered 2021-02-24: 7.5 ug/kg/min via INTRAVENOUS
  Administered 2021-02-24: 5 ug/kg/min via INTRAVENOUS
  Administered 2021-02-25 – 2021-03-07 (×23): 7.5 ug/kg/min via INTRAVENOUS
  Administered 2021-03-07 – 2021-03-08 (×2): 6 ug/kg/min via INTRAVENOUS
  Filled 2021-02-12 (×45): qty 250

## 2021-02-12 MED ORDER — FUROSEMIDE 10 MG/ML IJ SOLN
20.0000 mg/h | INTRAVENOUS | Status: DC
  Administered 2021-02-12: 15 mg/h via INTRAVENOUS
  Administered 2021-02-13 – 2021-02-16 (×8): 20 mg/h via INTRAVENOUS
  Filled 2021-02-12 (×12): qty 20

## 2021-02-12 MED ORDER — APIXABAN 5 MG PO TABS
5.0000 mg | ORAL_TABLET | Freq: Two times a day (BID) | ORAL | Status: DC
  Administered 2021-02-12 – 2021-02-13 (×4): 5 mg via ORAL
  Filled 2021-02-12 (×4): qty 1

## 2021-02-12 MED ORDER — METOLAZONE 5 MG PO TABS
2.5000 mg | ORAL_TABLET | Freq: Once | ORAL | Status: AC
  Administered 2021-02-12: 2.5 mg via ORAL
  Filled 2021-02-12: qty 1

## 2021-02-12 NOTE — Progress Notes (Signed)
Cardiology brief note  Patient seen and examined C/o fluid overload, dizzy spell, decreased energy, SOB and just not feeling well BP 83/73 (MAP 61). On lasix '15mg'$ /hr, UO is minimal. Bladder scan- not much  Morbidly obesee male sitting on the chair with increased work of breathing Abdominal edema/ascities Leg swelling- cold legs  ECHO- EF <20%  Suspect low flow state/impending Cardiogenic shock.  His BP are low and he has not put out Urine despite lasix gtt. Rate control/aflutter also contributing and needs NSR.   - start dobutamine 2.5mg/kg/min. - increase lasix gtt to '20mg'$ /hr. Give metolazone '5mg'$  x1 now.  - get PICC line in am (he refused earlier as he was not able to lay flat) - maintain 2 PIV - get ScVO2 after PICC line - hold tonight entresto and as BP improves- reinstitute afterload reduction - continue amiodarone and eliquis - patient refusing TEE DCCV in am. Probably get off fluid/diurese and plan for TEE DCCV later.  RRenae FickleCardiology coverage.

## 2021-02-12 NOTE — TOC CM/SW Note (Addendum)
TOC CM received call back from [place], Zach. Pt current bill for Trilogy/NIV is $1900, his monthly copay was $50.00. He received that DME in 02/2020. Pt returned the DME in March due to not being able to pay monthly copay. Also has oxygen with [employer]. Per rep, a CPAP copay would be less expensive than NIV. Pt is currently on CPAP with oxygen in hospital. Will need Sleep Study for CPAP. [employer] currently have CPAP machines in stock after nationwide shortage during pandemic. Updated attending on CPAP and NIV. [place], [place] ED TOC CM [phone]

## 2021-02-12 NOTE — TOC Progression Note (Signed)
Transition of Care (TOC) - Progression Note  Heart Failure   Patient Details  Name: Phillip Young MRN: [id number] Date of Birth: [birth year]  Transition of Care [facility]) CM/SW [place], [place] Phone Number: 02/12/2021, 4:01 PM  Clinical Narrative:    CSW spoke with the patient at bedside and completed a very brief SDOH with the patient who reported that he is still working and has tried to apply for disability through his employer but that hasn't been successful for him. CSW spoke with the patient about the [facility] to see if he would find that helpful for him and patient was agreeable and CSW obtained his signature for the Food Stamp and disability referral for the [facility] to reach out to him. CSW faxed over the referral and application and informed the patient that the [facility] will be in touch and to reach out to CSW as other social needs arise.  CSW will continue to follow throughout discharge.  Expected Discharge Plan: Home/Self Care Barriers to Discharge: Continued Medical Work up  Expected Discharge Plan and Services Expected Discharge Plan: Home/Self Care In-house Referral: Clinical Social Work Discharge Planning Services: CM Consult, HF Clinic   Living arrangements for the past 2 months: Single Family Home                                       Social Determinants of Health (SDOH) Interventions Food Insecurity Interventions: Assist with [employer] Application Financial Strain Interventions: Other (Comment) (Referral to [facility]) Housing Interventions: Intervention Not Indicated Transportation Interventions: Intervention Not Indicated  Readmission Risk Interventions Readmission Risk Prevention Plan 10/16/2020 10/16/2020 10/14/2020  Transportation Screening Complete Complete Complete  PCP or Specialist Appt within 5-7 Days Complete Complete Complete  Home Care Screening Complete - -  Medication Review (RN CM) Referral to Pharmacy - -   Some recent data might be hidden   Lamont Tant, MSW, [place] Heart Failure Social Worker

## 2021-02-12 NOTE — Progress Notes (Signed)
   02/12/21 0226  Assess: MEWS Score  Temp (!) 97.4 F (36.3 C)  BP (!) 141/92  Pulse Rate (!) 131  ECG Heart Rate (!) 131  Resp 20  SpO2 92 %  O2 Device CPAP  Assess: MEWS Score  MEWS Temp 0  MEWS Systolic 0  MEWS Pulse 3  MEWS RR 0  MEWS LOC 0  MEWS Score 3  MEWS Score Color Yellow  Assess: if the MEWS score is Yellow or Red  Were vital signs taken at a resting state? Yes  Focused Assessment No change from prior assessment  Early Detection of Sepsis Score *See Row Information* Low  MEWS guidelines implemented *See Row Information* Yes  Treat  MEWS Interventions Administered scheduled meds/treatments  Pain Scale 0-10  Pain Score 0  Take Vital Signs  Increase Vital Sign Frequency  Yellow: Q 2hr X 2 then Q 4hr X 2, if remains yellow, continue Q 4hrs  Notify: Charge Nurse/RN  Name of Charge Nurse/RN Notified Deborah, RN  Date Charge Nurse/RN Notified 02/12/21  Time Charge Nurse/RN Notified [phone]

## 2021-02-12 NOTE — Consult Note (Addendum)
Advanced Heart Failure Team Consult Note   Primary Physician: Patient, No Pcp Per (Inactive) PCP-Cardiologist:  Dr. [name]   Reason for Consultation: acute on chronic biventricular systolic heart failure in setting of afib w/ RVR   HPI:    Phillip Young is seen today for evaluation of acute on chronic biventricular heart failure, in the setting of Afib w/ RVR at the request of Dr. Erlinda Hong, Internal Medicine.   35 y.o. with history of cardiomyopathy and obesity.  He has been known to have a cardiomyopathy (biventricular failure) since back in 2019, echo in 2019 with EF 20% [facility]).   Admitted to [facility] in [id number] with A/C Systolic HF. Diuresed with IV lasix and transitioned to torsemide. Placed on milrinone with low output and had RHC/LHC. Cath showed no CAD and volume overload.  Blood gas revealed a CO2 level elevated to 78. CPAP was initiated.   He was unable to tolerate Coreg due to wheezing and diarrhea.   06/17/2020 Echo EF 20-25%, RV poorly visualized.   Admitted 10/08/20 with viral CAP and A/C systolic heart failure. Treated with antibiotics and diuresed with IV lasix. Once diuresed transitioned to 80 mg torsemide daily. Had new onset A fib RVR thought to be triggered by CAP.  Placed on dig + amio + elqiuis for A fib RVR. He was switched to Bipap at discharge but he could not afford the Co-pay. Discharge weight 437 pounds.   He presented to the ED yesterday w/ complaints of SOB, hypoxia and tachycardia. About 2 weeks ago, he had a GI bug w/ diarrhea. Last week, he went to dentist and found to have infected tooth/abscess and started on 7 day course of amoxicillin. He reports nausea after taking abx. 2-3 days ago, he noticed increased SOB and checked his O2 sats w/ home pulse-ox and noted it to be low in the 80s, HR was also elevated but did not seek immediate medical attention. Went to work the next day and felt worse, prompting him to come to the ED for evaluation.   In ED, he  was found to be in 2:1 rapid Atrial Flutter, ventricular rate 135 bpm. Also in acute on chronic CHF. BNP 194 but likely falsely low in setting of super morbid obesity. CXR showed mild bilateral interstitial edema. He is ~40 lb above last d/c dry wt (437>>475 lb).   Pertinent Labs  WBC 10.3 and Afebrile Hgb 13.5 Na 135 CO2 25 SCr 1.14 Hs Trop 131>>146 Dig level < 0.2 D-dimer elevated 0.59 Hgb A1c 6.5 Mg 1.8  TSH 3.693 AST 44>>35 ALT 52>>47  Total Bili 2.1>>1.9   He was admitted and started on amio gtt + bolus. Received '80mg'$  IV lasix x 1 w/ poor urinary response. Also reports poor response to home PO torsemide over the last 2 weeks. Home HF meds have been continued. Remains in rapid AFL, 120s. BP normotensive. SCr up slight to 1.24, K 3.6, Na 132 today. Repeat echo pending.     Echo 02/2020 EF < 20%, G2DD, RV severely enlarged and severely reduced Echo 05/2020 EF 20-25%, G2DD, RV not well visualized. LA mildly dilated   Review of Systems: [y] = yes, '[ ]'$  = no   General: Weight gain [ Y]; Weight loss '[ ]'$ ; Anorexia '[ ]'$ ; Fatigue '[ ]'$ ; Fever '[ ]'$ ; Chills '[ ]'$ ; Weakness [ Y]  Cardiac: Chest pain/pressure '[ ]'$ ; Resting SOB '[ ]'$ ; Exertional SOB [Y ]; Orthopnea Jazmín.Cullens ]; Pedal Edema '[ ]'$ ; Palpitations [  Y]; Syncope '[ ]'$ ; Presyncope '[ ]'$ ; Paroxysmal nocturnal dyspnea'[ ]'$   Pulmonary: Cough '[ ]'$ ; Wheezing'[ ]'$ ; Hemoptysis'[ ]'$ ; Sputum '[ ]'$ ; Snoring [ Y]  GI: Vomiting'[ ]'$ ; Dysphagia'[ ]'$ ; Melena'[ ]'$ ; Hematochezia '[ ]'$ ; Heartburn'[ ]'$ ; Abdominal pain '[ ]'$ ; Constipation '[ ]'$ ; Diarrhea [Y ]; BRBPR '[ ]'$   GU: Hematuria'[ ]'$ ; Dysuria '[ ]'$ ; Nocturia'[ ]'$   Vascular: Pain in legs with walking '[ ]'$ ; Pain in feet with lying flat '[ ]'$ ; Non-healing sores '[ ]'$ ; Stroke '[ ]'$ ; TIA '[ ]'$ ; Slurred speech '[ ]'$ ;  Neuro: Headaches'[ ]'$ ; Vertigo'[ ]'$ ; Seizures'[ ]'$ ; Paresthesias'[ ]'$ ;Blurred vision '[ ]'$ ; Diplopia '[ ]'$ ; Vision changes '[ ]'$   Ortho/Skin: Arthritis '[ ]'$ ; Joint pain '[ ]'$ ; Muscle pain '[ ]'$ ; Joint swelling '[ ]'$ ; Back Pain '[ ]'$ ; Rash '[ ]'$   Psych: Depression'[ ]'$ ;  Anxiety'[ ]'$   Heme: Bleeding problems '[ ]'$ ; Clotting disorders '[ ]'$ ; Anemia '[ ]'$   Endocrine: Diabetes '[ ]'$ ; Thyroid dysfunction'[ ]'$   Home Medications Prior to Admission medications   Medication Sig Start Date End Date Taking? Authorizing Provider  amiodarone (PACERONE) 200 MG tablet Take 80 mg by mouth daily. 12/18/20  Yes [provider]  amoxicillin (AMOXIL) 500 MG capsule Take 500 mg by mouth 3 (three) times daily. 02/07/21  Yes [provider]  apixaban (ELIQUIS) 5 MG TABS tablet Take 1 tablet (5 mg total) by mouth 2 (two) times daily. 11/07/20  Yes Clegg, Amy D, NP  dapagliflozin propanediol (FARXIGA) 10 MG TABS tablet Take 1 tablet (10 mg total) by mouth daily. 04/16/20  Yes Amani Marseille, Shaune Pascal, MD  digoxin (LANOXIN) 0.125 MG tablet Take 1 tablet (0.125 mg total) by mouth daily. 04/16/20  Yes Nicholes Hibler, Shaune Pascal, MD  Potassium Chloride ER 20 MEQ TBCR Take 40 mEq by mouth 2 (two) times daily. 11/21/20 02/11/21 Yes Thimothy Barretta, Shaune Pascal, MD  sacubitril-valsartan (ENTRESTO) 97-103 MG Take 1 tablet by mouth 2 (two) times daily.   Yes [provider]  spironolactone (ALDACTONE) 25 MG tablet TAKE 1 TABLET(25 MG) BY MOUTH DAILY Patient taking differently: Take 25 mg by mouth daily. TAKE 1 TABLET(25 MG) BY MOUTH DAILY 12/23/20  Yes Krystine Pabst, Shaune Pascal, MD  Torsemide 40 MG TABS Take 80 mg by mouth daily. 12/24/20  Yes Clegg, Amy D, NP    Past Medical History: Past Medical History:  Diagnosis Date   Biventricular congestive heart failure ([place])    Last Echo 11/2019 at [facility] reveals EF 20%   Class 3 severe obesity due to excess calories with serious comorbidity and body mass index (BMI) of 50.0 to 59.9 in adult [facility]) 02/26/2020   Essential hypertension 02/26/2020   GERD without esophagitis 02/26/2020   Hidradenitis suppurativa 02/26/2020   OSA (obstructive sleep apnea) 02/26/2020   Prediabetes 02/26/2020    Past Surgical History: Past Surgical History:  Procedure Laterality Date    ABSCESS DRAINAGE     RIGHT/LEFT HEART CATH AND CORONARY ANGIOGRAPHY N/A 03/04/2020   Procedure: RIGHT/LEFT HEART CATH AND CORONARY ANGIOGRAPHY;  Surgeon: Larey Dresser, MD;  Location: [place] [facility];  Service: Cardiovascular;  Laterality: N/A;    Family History: Family History  Problem Relation Age of Onset   Heart disease Mother    Hypertension Mother    Pulmonary Hypertension Mother    Drug abuse Father        died due to Heroin overdose    Social History: Social History   Socioeconomic History   Marital status: Unknown    Spouse name: Not  on file   Number of children: Not on file   Years of education: Not on file   Highest education level: Not on file  Occupational History   Not on file  Tobacco Use   Smoking status: Former   Smokeless tobacco: Former   Tobacco comments:    quit in 2019  Substance and Sexual Activity   Alcohol use: Never   Drug use: Never   Sexual activity: Not on file  Other Topics Concern   Not on file  Social History Narrative   Not on file   Social Determinants of Health   Financial Resource Strain: Not on file  Food Insecurity: Not on file  Transportation Needs: Not on file  Physical Activity: Not on file  Stress: Not on file  Social Connections: Not on file    Allergies:  Allergies  Allergen Reactions   Coreg [Carvedilol] Shortness Of Breath and Diarrhea    Wheezing     Objective:    Vital Signs:   Temp:  [97.4 F (36.3 C)-98.2 F (36.8 C)] 97.8 F (36.6 C) (07/21 1203) Pulse Rate:  [122-138] 123 (07/21 1203) Resp:  [14-34] 20 (07/21 1203) BP: (99-155)/(45-99) 125/91 (07/21 1203) SpO2:  [71 %-100 %] 96 % (07/21 1203) Weight:  [215.5 kg] 215.5 kg (07/21 0025)    Weight change: Filed Weights   02/12/21 0025  Weight: (!) 215.5 kg    Intake/Output:   Intake/Output Summary (Last 24 hours) at 02/12/2021 1249 Last data filed at 02/12/2021 1204 Gross per 24 hour  Intake 360 ml  Output 300 ml  Net 60 ml       Physical Exam    General:  supper morbidly obese young AAM, sitting up in chair. No resp difficulty at rest HEENT: normal Neck: supple. JVP elevated to ear . Carotids 2+ bilat; no bruits. No lymphadenopathy or thyromegaly appreciated. Cor: PMI nondisplaced. Irregular rhythm, tachy rate No rubs, gallops or murmurs. Lungs: decreased BS at the bases bilaterally  Abdomen: obese, soft, nontender, nondistended. No hepatosplenomegaly. No bruits or masses. Good bowel sounds. Extremities: no cyanosis, clubbing, rash, obese LEs w/ 1+ bilateral LEE  Neuro: alert & orientedx3, cranial nerves grossly intact. moves all 4 extremities w/o difficulty. Affect pleasant   Telemetry   Atrial flutter 120s   EKG    2:1 AFL w/ RVR 135 bpm   Labs   Basic Metabolic Panel: Recent Labs  Lab 02/11/21 1208 02/11/21 2100 02/12/21 0413  NA 135  --  132*  K 3.6  --  3.6  CL 99  --  97*  CO2 25  --  23  GLUCOSE 127*  --  144*  BUN 10  --  13  CREATININE 1.14  --  1.24  CALCIUM 8.6*  --  8.8*  MG  --  1.8  --     Liver Function Tests: Recent Labs  Lab 02/11/21 1208 02/12/21 0413  AST 44* 35  ALT 52* 47*  ALKPHOS 68 69  BILITOT 2.1* 1.9*  PROT 7.7 7.9  ALBUMIN 3.4* 3.4*   No results for input(s): LIPASE, AMYLASE in the last 168 hours. No results for input(s): AMMONIA in the last 168 hours.  CBC: Recent Labs  Lab 02/11/21 1208  WBC 10.3  NEUTROABS 8.1*  HGB 13.5  HCT 42.9  MCV 93.1  PLT 343    Cardiac Enzymes: No results for input(s): CKTOTAL, CKMB, CKMBINDEX, TROPONINI in the last 168 hours.  BNP: BNP (last 3 results) Recent Labs  10/10/20 1336 11/07/20 0932 02/11/21 1500  BNP 126.9* 106.0* 194.3*    ProBNP (last 3 results) No results for input(s): PROBNP in the last 8760 hours.   CBG: No results for input(s): GLUCAP in the last 168 hours.  Coagulation Studies: No results for input(s): LABPROT, INR in the last 72 hours.   Imaging   DG Chest 2  View  Result Date: 02/11/2021 CLINICAL DATA:  shortness of breath and feeling like his heart is racing.hx chf EXAM: CHEST - 2 VIEW COMPARISON:  10/10/2020 FINDINGS: The central line has been removed. There are mild bilateral interstitial infiltrates or edema, improved since previous. Persistent cardiomegaly. No effusion.  No pneumothorax. Visualized bones unremarkable. IMPRESSION: 1. Mild bilateral interstitial edema or infiltrates, improved since previous Electronically Signed   By: Lucrezia Europe M.D.   On: 02/11/2021 13:32     Medications:     Current Medications:  amoxicillin  500 mg Oral TID   apixaban  5 mg Oral BID   dapagliflozin propanediol  10 mg Oral Daily   digoxin  0.125 mg Oral Daily   potassium chloride SA  40 mEq Oral BID   sacubitril-valsartan  1 tablet Oral BID   sodium chloride flush  3 mL Intravenous Q12H   spironolactone  25 mg Oral Daily    Infusions:  sodium chloride     amiodarone 30 mg/hr (02/12/21 0923)      Patient Profile   35 y/o morbidly obese AAM w/ chronic biventricular systolic heart failure/ NICM, OSA not on CPAP, HTN, type 2DM and h/o Atrial Fibrillation, admitted for a/c CHF and AFL w/ RVR.   Assessment/Plan   1. Acute on Chronic Biventricular Heart Failure:  Known cardiomyopathy since 2019, previously [facility].  Echo 02/27/2020 with EF < 20%, moderate LV dilation, severely decreased RV function with severe RV dilation, severe biatrial enlargement, mild MR. No history of ETOH/drugs.  No FH of cardiomyopathy (mother with [place]).  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  He is too large for cardiac MRI. RHC/LHC on 03/04/20 with no CAD (nonischemic cardiomyopathy), elevated left and right heart filling pressures, and low CI at 2.1. 05/2020 Echo showed EF 20-25%. Evaluated by EP but not a candidate for ICD given BMI.  - Now readmitted w/ a/c CHF + AFL w/ RVR. Markedly volume overloaded. Wt up 40  lb from prior d/c wt 4 months ago. Poor initial response to IV Lasix.  - Place PICC line for CVP monitoring to help guide diuresis  - will also check and follow co-ox (required milrinone prior admit 02/2020) - Give 80 mg IV Lasix followed by lasix gtt at 8/hr - Fluid restrict <1,800 ml/ day  - Continue Entresto 97-103 bid  - Continue Spiro 25 mg daily  - Continue Farxiga 10 mg daily for now (may need to d/c if recurrent UTIs, see below) - Continue Digoxin 0.125 (dig level < 0.2)  - Need attempt at DCCV to restore NSR, once better diuresed  - with multiple re-admits in the last 12 months for CHF, consider CardioMEMs  2. Aflutter w/ RVR: Noted to be in Afib prior admit, 3/22, in setting of CAP and a/cCHF but converted to NSR on amiodarone. Discharged home w/ eliquis. In 2:1 AFL this admit in setting of a/c CHF w/ marked volume overload, + recent gastroenteritis w/ diarrhea. Has OSA but not using CPAP. TSH WNL. Admits to intermittent compliance w/ Eliquis. Has missed several doses in the last  month.  - continue amio gtt, can increase to 60/hr if needed - continue digoxin 0.125 mg  - keep K > 4.0 and Mg > 2.0  - continue Eliquis 5 mg bid. Consider switch to Xarelto to help w/ compliance - plan TEE/DCCV this admit, will need more diuresis first  3. Acute Hypoxic Respiratory Failure: suspect underlying OHS exacerbated but a/c CHF + rapid ALF - c/w supp O2 to maintain O2 sats > 94% - c/w diuresis + short term amio for rate/rhythm control  - needs wt loss  - no longer using BiPAP due to cost  4. HTH: controlled on current regimen - c/w GDMT for HF  - monitor BP w/ lasix gtt  5. T2DM  - Hbg A1c 6.5  - on Farxiga 10 mg daily  6. OSA - no longer using BiPAP due to cost  7. Super Morbid Obesity: Body mass index is 66.26 kg/m. - wt loss advised  8. Tooth Abscess  - c/w amoxicillin 500 tid x 7 days  9. UTI:  - asymptomatic ? If not clean catch  - c/w amoxicillin  10. Elevated Hs Trop: Suspect  demand ischemia in setting of CHF + rapid AFL. Cath 8/21 showed no CAD.     Medication concerns reviewed with patient and pharmacy team. Barriers identified: poor compliance w/ meds. Would benefit from paramedicine.   Length of Stay: 1  Lyda Jester, PA-C  02/12/2021, 12:49 PM  Advanced Heart Failure Team Pager [phone] (M-F; 7a - 5p)  Please contact [facility] for night-coverage after hours (4p -7a ) and weekends on [email or web address]  Patient seen and examined with the above-signed Advanced Practice Provider and/or Housestaff. I personally reviewed laboratory data, imaging studies and relevant notes. I independently examined the patient and formulated the important aspects of the plan. I have edited the note to reflect any of my changes or salient points. I have personally discussed the plan with the patient and/or family.  34 y/o male with PAF, OSA, marked obesity, systolic HF with biventricular dysfunction due to NICM. Now admitted with severe volume overload and respiratory failure in setting of AFL with RVR.   Minimal response to initial lasix 80 IV  General:  Markedly obese  Mildly SOB  HEENT: normal Neck: supple. JVP to ear Carotids 2+ bilat; no bruits. No lymphadenopathy or thryomegaly appreciated. Cor: PMI nondisplaced. Tachy mildly irregular No rubs, gallops or murmurs. Lungs: decreased throughout  Abdomen: markedly obese + distended normal bowel sounds Extremities: no cyanosis, clubbing, rash, 3-4+ edema Neuro: alert & orientedx3, cranial nerves grossly intact. moves all 4 extremities w/o difficulty. Affect pleasant  Recurrent HF with marked volume overload in setting of AFL with RVR. Not responding well to 80 IV lasix. Agree with lasix gtt + one dose of metolazone today. Place PICC to follow CVP and co-ox. May need milrinone support. Continue IV amio. Continue DOAC. TEE/DCCV when diuresed. (Has missed doses of Eliquis as outpatient.).   Glori Bickers, MD  5:09  PM

## 2021-02-12 NOTE — TOC Initial Note (Addendum)
Transition of Care [facility]) - Initial/Assessment Note    Patient Details  Name: Phillip Young MRN: [id number] Date of Birth: Dec 15, 1985  [facility]) CM/SW Contact:    Erenest Rasher, RN Phone Number: [phone] 02/12/2021, 2:07 PM  Clinical Narrative:                 TOC CM spoke to pt at bedside. States he did have a Bipap machine with [employer] but due to his monthly cost of $200 he turned machine back into DME vendor. States he does have [employer]. Explained TOC CM will follow up with [employer] to check on copay cost and outstanding bill. He reports he works full-time with [employer]. Pt is in agreement to see Dietician and follow up outpatient with Dietician and/or [facility]. He reports having a previous appt with outpt Nutritionist but he was dx with COVID and did not follow up. ED attending updated and order given for pt to see dietician. He does weigh but not daily, and tries to decrease his sodium intake.   Declines COVID vaccine.    Has appt with new PCP in Sept 20, 2022 with Dr Ron[facility]d Ramp at [facility]. Reports he does not know the name of his [place], Thedore Mins to follow up on cost of Bipap. And if new Rx is required.   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) Patient language and  need for interpreter reviewed:: Yes Do you feel safe going back to the place where you live?: Yes      Need for Family Participation in Patient Care: No (Comment) Care giver support system in place?: No (comment) Current home services: DME (oxygen ([place])) Criminal Activity/Legal Involvement Pertinent to Current Situation/Hospitalization: No - Comment as needed  Activities of Daily Living      Permission Sought/Granted Permission sought to share information with : Case Manager, Facility Sport and exercise psychologist, PCP Permission granted to share information with : Yes, Verbal Permission Granted  Share Information with NAME: Bay Blazejewski     Permission granted to share info w Relationship: mother  Permission granted to share info w Contact Information: [phone]  Emotional Assessment Appearance:: Appears stated age Attitude/Demeanor/Rapport: Engaged Affect (typically observed): Accepting Orientation: : Oriented to Self, Oriented to  Time, Oriented to Place, Oriented to Situation   Psych Involvement: No (comment)  Admission diagnosis:  SOB (shortness of breath) [R06.02] Tachycardia [R00.0] Atrial flutter with rapid ventricular response ([name]) [I48.92] Acute respiratory failure with hypoxia ([name]) [J96.01] Patient Active Problem List   Diagnosis Date Noted   Atrial flutter with rapid ventricular response ([place]) 02/11/2021   Elevated troponin 02/11/2021   Respiratory failure with hypoxia ([place]) 02/11/2021   CAP (community acquired  pneumonia) 10/08/2020   Acute on chronic systolic congestive heart failure ([name])    Hypokalemia    Biventricular heart failure ([name])    Chronic cough    Acute on chronic systolic (congestive) heart failure ([place]) 02/26/2020   Morbid obesity with body mass index (BMI) of 60.0 to 69.9 in adult [facility]) 02/26/2020   Essential hypertension 02/26/2020   GERD without esophagitis 02/26/2020   Diuretic-induced hypokalemia 02/26/2020   OSA (obstructive  sleep apnea) 02/26/2020   Hidradenitis suppurativa 02/26/2020   Prediabetes 02/26/2020   PCP:  Patient, No Pcp Per (Inactive) Pharmacy:   [id number] - [place], [place] - [address] AT [place] [address] [place] [address] Phone: [phone] Fax: [phone]  [facility] [address] [address] Phone: [phone] Fax: [phone]     Social Determinants of Health (SDOH) Interventions    Readmission Risk Interventions Readmission Risk Prevention Plan 10/16/2020 10/16/2020 10/14/2020  Transportation Screening Complete Complete Complete  PCP or Specialist Appt within 5-7 Days Complete Complete Complete  Home Care Screening Complete - -  Medication Review (RN CM) Referral to Pharmacy - -  Some recent data might be hidden

## 2021-02-12 NOTE — Progress Notes (Signed)
Patient states that he is feeling very uncomfortable.  He states that he is having ringing in his ears and dizziness after he has a coughing spell.  Patient states that the ringing in the ears last about 17mns after he has a coughing episode, and he also states that the dizziness last about 224ms after he has a coughing spell.  Also complains of his peripheral vision fading away when he cuts his to the side.  States that his vision does come back after a few mins.  Patient states that he does not want to have the cardioversion in the morning. He states that he wants to start feeling better before he has any invasive procedures.  On-call MD notified and states that he will come and see the patient.  Will continue to monitor.   GaDonah DriverRN

## 2021-02-12 NOTE — Progress Notes (Signed)
Went to patient room to place a PICC line. Floor RN at bedside, patient is not ready for the procedure due to difficulty of breathing that cannot lie down in bed for sterile procedure. Primary RN will update PICC RN when patient is ready. Will follow up.

## 2021-02-12 NOTE — ED Notes (Signed)
Bladder scan of 50 mL

## 2021-02-12 NOTE — Progress Notes (Addendum)
PROGRESS NOTE    Phillip Young  SJG:[id number] DOB: 12-24-1985 DOA: 02/11/2021 PCP: Janith Lima, MD    Chief Complaint  Patient presents with   Shortness of Breath    Brief Narrative:  Phillip Young is a 35 y.o. male with medical history significant of biventricular heart failure, morbid obesity, HTN, GERD, OSA, prediabetes who presented with shortness of breath and palpitations x 1 week, He checked his pulse ox at home and his oxygen was 68% and heart rate was 150 ED Course: afebrile, bp: 112/91, HR: 131, RR: 26, oxygen: 88-91% room air-->2L 94% He is admitted for acute heart failure acute hypoxic respiratory failure, a flutter RVR Heart failure team consulted  Subjective:  Assessment & Plan:   Principal Problem:   Acute on chronic systolic (congestive) heart failure ([place]) Active Problems:   Morbid obesity with body mass index (BMI) of 60.0 to 69.9 in adult [facility])   Essential hypertension   GERD without esophagitis   OSA (obstructive sleep apnea)   Prediabetes   Atrial flutter with rapid ventricular response ([name])   Elevated troponin   Respiratory failure with hypoxia ([name])   Acute on chronic biventricular heart failure exacerbation/Acute hypoxic respiratory failure -LVEF less than 20% -Elevated troponin likely demand ischemia in the setting of heart failure and a flutter, has cath 8/21 showed no CAD -He did not appear to respond IV Lasix , now he is on Lasix drip, spironolactone ,Entresto, Farxiga, digoxin -PICC line to be placed to monitor CVP and venous O2 sat -Appreciate heart failure management, will follow recommendation  A flutter RVR Amiodarone drip, continue Eliquis Plan for cardioversion Management per cardiology  Elevation of LFTs Likely due to liver congestion Improving  CKD 2 Monitor renal function while on Lasix drip and Entresto, spironolactone  Non-insulin-dependent type 2 diabetes A1c 6.5 Continue Farxiga  On amoxicillin after recent  teeth extraction Continue to finish course  Morbid obesity : Body mass index is 66.26 kg/m.. OSA, no longer use BiPAP due to cost, appreciate case manager management, hopefully can get him home on NIV, as he likely has obesity hypoventilation syndrome as well Will likely need home O2 as well Will provide weight management clinic information and dietitian information to the patient      Unresulted Labs (From admission, onward)     Start     Ordered   02/13/21 0500  Blood gas, arterial  Tomorrow morning,   R        02/12/21 1508   02/12/21 1304  .Cooxemetry Panel (carboxy, met, total hgb, O2 sat)  Once,   R        02/12/21 1304              DVT prophylaxis:  apixaban (ELIQUIS) tablet 5 mg   Code Status: Full Family Communication: Patient Disposition:   Status is: Inpatient   Dispo: The patient is from: Home              Anticipated d/c is to: Home              Anticipated d/c date is: To be determined                Consultants:  Cardiology/heart failure team  Procedures:  PICC line placement  Antimicrobials:   Amoxicillin     Objective: Vitals:   02/12/21 0800 02/12/21 1203 02/12/21 1612 02/12/21 1754  BP: 126/70 (!) 125/91 (!) 94/52 100/64  Pulse: (!) 122 (!) 123 (!) 122 (!) 127  Resp: '16 20 20   '$ Temp: 97.6 F (36.4 C) 97.8 F (36.6 C) 98.4 F (36.9 C)   TempSrc: Oral Oral Oral   SpO2: 94% 96% 91% 91%  Weight:      Height:        Intake/Output Summary (Last 24 hours) at 02/12/2021 1821 Last data filed at 02/12/2021 1204 Gross per 24 hour  Intake 360 ml  Output 300 ml  Net 60 ml   Filed Weights   02/12/21 0025  Weight: (!) 215.5 kg    Examination:  General exam: calm, NAD Respiratory system:  Respiratory effort normal. Cardiovascular system: IRRR. +pedal edema. Gastrointestinal system: Abdomen is nondistended, soft and nontender.+ bowel sounds  Central nervous system: Alert and oriented. No focal neurological  deficits. Extremities: Symmetric 5 x 5 power. Skin: No rashes, lesions or ulcers Psychiatry: Judgement and insight appear normal. Mood & affect appropriate.     Data Reviewed: I have personally reviewed following labs and imaging studies  CBC: Recent Labs  Lab 02/11/21 1208  WBC 10.3  NEUTROABS 8.1*  HGB 13.5  HCT 42.9  MCV 93.1  PLT 027    Basic Metabolic Panel: Recent Labs  Lab 02/11/21 1208 02/11/21 2100 02/12/21 0413  NA 135  --  132*  K 3.6  --  3.6  CL 99  --  97*  CO2 25  --  23  GLUCOSE 127*  --  144*  BUN 10  --  13  CREATININE 1.14  --  1.24  CALCIUM 8.6*  --  8.8*  MG  --  1.8  --     GFR: Estimated Creatinine Clearance: 154.5 mL/min (by C-G formula based on SCr of 1.24 mg/dL).  Liver Function Tests: Recent Labs  Lab 02/11/21 1208 02/12/21 0413  AST 44* 35  ALT 52* 47*  ALKPHOS 68 69  BILITOT 2.1* 1.9*  PROT 7.7 7.9  ALBUMIN 3.4* 3.4*    CBG: No results for input(s): GLUCAP in the last 168 hours.   Recent Results (from the past 240 hour(s))  Resp Panel by RT-PCR (Flu A&B, Covid) Nasopharyngeal Swab     Status: None   Collection Time: 02/11/21  2:45 PM   Specimen: Nasopharyngeal Swab; Nasopharyngeal(NP) swabs in vial transport medium  Result Value Ref Range Status   SARS Coronavirus 2 by RT PCR NEGATIVE NEGATIVE Final    Comment: (NOTE) SARS-CoV-2 target nucleic acids are NOT DETECTED.  The SARS-CoV-2 RNA is generally detectable in upper respiratory specimens during the acute phase of infection. The lowest concentration of SARS-CoV-2 viral copies this assay can detect is 138 copies/mL. A negative result does not preclude SARS-Cov-2 infection and should not be used as the sole basis for treatment or other patient management decisions. A negative result may occur with  improper specimen collection/handling, submission of specimen other than nasopharyngeal swab, presence of viral mutation(s) within the areas targeted by this assay, and  inadequate number of viral copies(<138 copies/mL). A negative result must be combined with clinical observations, patient history, and epidemiological information. The expected result is Negative.  Fact Sheet for Patients:  EntrepreneurPulse.com.au  Fact Sheet for Healthcare Providers:  IncredibleEmployment.be  This test is no t yet approved or cleared by the [place] FDA and  has been authorized for detection and/or diagnosis of SARS-CoV-2 by FDA under an Emergency Use Authorization (EUA). This EUA will remain  in effect (meaning this test can be used) for the duration of the COVID-19 declaration under Section 564(b)(1) of  the Act, 21 U.S.C.section 360bbb-3(b)(1), unless the authorization is terminated  or revoked sooner.       Influenza A by PCR NEGATIVE NEGATIVE Final   Influenza B by PCR NEGATIVE NEGATIVE Final    Comment: (NOTE) The Xpert Xpress SARS-CoV-2/FLU/RSV plus assay is intended as an aid in the diagnosis of influenza from Nasopharyngeal swab specimens and should not be used as a sole basis for treatment. Nasal washings and aspirates are unacceptable for Xpert Xpress SARS-CoV-2/FLU/RSV testing.  Fact Sheet for Patients: EntrepreneurPulse.com.au  Fact Sheet for Healthcare Providers: IncredibleEmployment.be  This test is not yet approved or cleared by the [place] FDA and has been authorized for detection and/or diagnosis of SARS-CoV-2 by FDA under an Emergency Use Authorization (EUA). This EUA will remain in effect (meaning this test can be used) for the duration of the COVID-19 declaration under Section 564(b)(1) of the Act, 21 U.S.C. section 360bbb-3(b)(1), unless the authorization is terminated or revoked.  Performed at [facility], [place] [address] [place], [place] [address]          Radiology Studies: DG Chest 2 View  Result Date: 02/11/2021 CLINICAL DATA:   shortness of breath and feeling like his heart is racing.hx chf EXAM: CHEST - 2 VIEW COMPARISON:  10/10/2020 FINDINGS: The central line has been removed. There are mild bilateral interstitial infiltrates or edema, improved since previous. Persistent cardiomegaly. No effusion.  No pneumothorax. Visualized bones unremarkable. IMPRESSION: 1. Mild bilateral interstitial edema or infiltrates, improved since previous Electronically Signed   By: Lucrezia Europe M.D.   On: 02/11/2021 13:32   ECHOCARDIOGRAM COMPLETE  Result Date: 02/12/2021    ECHOCARDIOGRAM REPORT   Patient Name:   Phillip Young Date of Exam: 02/12/2021 Medical Rec #:  [id number]      Height:       71.0 in Accession #:    [id number]     Weight:       475.1 lb Date of Birth:  03-Aug-1985       BSA:          3.046 m Patient Age:    32 years       BP:           126/70 mmHg Patient Gender: M              HR:           122 bpm. Exam Location:  Inpatient Procedure: 2D Echo, Color Doppler, Cardiac Doppler and Intracardiac            Opacification Agent Indications:    H47.42 Acute systolic (congestive) heart failure  History:        Patient has prior history of Echocardiogram examinations, most                 recent 06/17/2020. CHF; Risk Factors:Hypertension and Sleep                 Apnea.  Sonographer:    Raquel Sarna Senior RDCS Referring Phys: [id number] Orma Flaming  Sonographer Comments: Very technically difficult study. Patient is morbidly obese, scanned upright in recliner due to dyspnea. IMPRESSIONS  1. Left ventricular ejection fraction, by estimation, is <20%. The left ventricle has severely decreased function. Left ventricular endocardial border not optimally defined to evaluate regional wall motion. The left ventricular internal cavity size was severely dilated. Left ventricular diastolic parameters are indeterminate.  2. Right ventricular systolic function was not well visualized. The right ventricular size is normal. Tricuspid  regurgitation signal is inadequate  for assessing PA pressure.  3. Left atrial size was moderately dilated.  4. The mitral valve was not well visualized. Trivial mitral valve regurgitation.  5. The aortic valve is tricuspid. Aortic valve regurgitation is not visualized. Comparison(s): A prior study was performed on 06/17/2020. LVEF is severely reduced; difficult comparison (this is a very technically difficult study). FINDINGS  Left Ventricle: Left ventricular ejection fraction, by estimation, is <20%. The left ventricle has severely decreased function. Left ventricular endocardial border not optimally defined to evaluate regional wall motion. Definity contrast agent was given  IV to delineate the left ventricular endocardial borders. The left ventricular internal cavity size was severely dilated. There is no left ventricular hypertrophy. Left ventricular diastolic parameters are indeterminate. Right Ventricle: The right ventricular size is normal. Right vetricular wall thickness was not well visualized. Right ventricular systolic function was not well visualized. Tricuspid regurgitation signal is inadequate for assessing PA pressure. Left Atrium: Left atrial size was moderately dilated. Right Atrium: Right atrial size was normal in size. Pericardium: There is no evidence of pericardial effusion. Mitral Valve: The mitral valve was not well visualized. Trivial mitral valve regurgitation. Tricuspid Valve: The tricuspid valve is not well visualized. Tricuspid valve regurgitation is not demonstrated. No evidence of tricuspid stenosis. Aortic Valve: The aortic valve is tricuspid. Aortic valve regurgitation is not visualized. Pulmonic Valve: The pulmonic valve was not well visualized. Pulmonic valve regurgitation is not visualized. Aorta: The aortic root and ascending aorta are structurally normal, with no evidence of dilitation. IAS/Shunts: The interatrial septum was not well visualized.  LEFT VENTRICLE PLAX 2D LVIDd:         7.20 cm LVIDs:         6.70 cm  LV PW:         0.90 cm LV IVS:        0.70 cm LVOT diam:     2.50 cm LV SV:         32 LV SV Index:   11 LVOT Area:     4.91 cm  RIGHT VENTRICLE RV S prime:     6.09 cm/s TAPSE (M-mode): 0.7 cm LEFT ATRIUM            Index       RIGHT ATRIUM           Index LA diam:      4.40 cm  1.44 cm/m  RA Area:     20.70 cm LA Vol (A4C): 131.0 ml 43.01 ml/m RA Volume:   55.60 ml  18.26 ml/m  AORTIC VALVE LVOT Vmax:   54.30 cm/s LVOT Vmean:  37.700 cm/s LVOT VTI:    0.066 m  AORTA Ao Root diam: 3.00 cm Ao Asc diam:  3.00 cm  SHUNTS Systemic VTI:  0.07 m Systemic Diam: 2.50 cm Riley Lam MD Electronically signed by Riley Lam MD Signature Date/Time: 02/12/2021/3:50:48 PM    Final    [place] EKG SITE RITE  Result Date: 02/12/2021 If Site Rite image not attached, placement could not be confirmed due to current cardiac rhythm.       Scheduled Meds:  amoxicillin  500 mg Oral TID   apixaban  5 mg Oral BID   dapagliflozin propanediol  10 mg Oral Daily   digoxin  0.125 mg Oral Daily   potassium chloride SA  40 mEq Oral BID   sacubitril-valsartan  1 tablet Oral BID   sodium chloride flush  3 mL Intravenous Q12H  spironolactone  25 mg Oral Daily   Continuous Infusions:  sodium chloride     amiodarone 60 mg/hr (02/12/21 1642)   furosemide (LASIX) 200 mg in dextrose 5% 100 mL ($Remov'2mg'fGGnvc$ /mL) infusion 15 mg/hr (02/12/21 1751)     LOS: 1 day   Time spent: 29mins Greater than 50% of this time was spent in counseling, explanation of diagnosis, planning of further management, and coordination of care.   Voice Recognition Viviann Spare dictation system was used to create this note, attempts have been made to correct errors. Please contact the author with questions and/or clarifications.   Florencia Reasons, MD PhD FACP [facility]  Available via Epic secure chat 7am-7pm for nonurgent issues Please page for urgent issues To page the attending provider between 7A-7P or the covering provider during after  hours 7P-7A, please log into the web site [email or web address] and access using universal  password for that web site. If you do not have the password, please call the hospital operator.    02/12/2021, 6:21 PM

## 2021-02-12 NOTE — Telephone Encounter (Signed)
Advanced Heart Failure Patient Advocate Encounter  Prior Authorization for [place] has been approved.    Effective dates: 03/13/21 through 03/13/22 (this pa will pick up when the current pa expires).  Charlann Boxer, CPhT

## 2021-02-12 NOTE — Progress Notes (Signed)
Echocardiogram 2D Echocardiogram has been performed.  Oneal Deputy Keyah Blizard RDCS 02/12/2021, 2:28 PM

## 2021-02-13 ENCOUNTER — Encounter (HOSPITAL_COMMUNITY): Payer: Self-pay | Admitting: Certified Registered Nurse Anesthetist

## 2021-02-13 ENCOUNTER — Inpatient Hospital Stay (HOSPITAL_COMMUNITY): Payer: BC Managed Care – PPO

## 2021-02-13 ENCOUNTER — Inpatient Hospital Stay: Payer: Self-pay

## 2021-02-13 ENCOUNTER — Encounter (HOSPITAL_COMMUNITY)
Admission: EM | Disposition: A | Discharge status: Home / Self Care | Payer: Self-pay | Source: Home / Self Care | Attending: Cardiology

## 2021-02-13 ENCOUNTER — Encounter (HOSPITAL_COMMUNITY): Payer: Self-pay | Admitting: Family Medicine

## 2021-02-13 DIAGNOSIS — I5023 Acute on chronic systolic (congestive) heart failure: Secondary | ICD-10-CM | POA: Diagnosis not present

## 2021-02-13 DIAGNOSIS — R570 Cardiogenic shock: Secondary | ICD-10-CM | POA: Diagnosis not present

## 2021-02-13 LAB — BASIC METABOLIC PANEL WITH GFR
Anion gap: 11 (ref 5–15)
BUN: 22 mg/dL — ABNORMAL HIGH (ref 6–20)
CO2: 24 mmol/L (ref 22–32)
Calcium: 8.5 mg/dL — ABNORMAL LOW (ref 8.9–10.3)
Chloride: 97 mmol/L — ABNORMAL LOW (ref 98–111)
Creatinine, Ser: 2.73 mg/dL — ABNORMAL HIGH (ref 0.61–1.24)
GFR, Estimated: 30 mL/min — ABNORMAL LOW
Glucose, Bld: 106 mg/dL — ABNORMAL HIGH (ref 70–99)
Potassium: 4.1 mmol/L (ref 3.5–5.1)
Sodium: 132 mmol/L — ABNORMAL LOW (ref 135–145)

## 2021-02-13 LAB — MAGNESIUM: Magnesium: 1.8 mg/dL (ref 1.7–2.4)

## 2021-02-13 LAB — BASIC METABOLIC PANEL
Anion gap: 11 (ref 5–15)
BUN: 23 mg/dL — ABNORMAL HIGH (ref 6–20)
CO2: 27 mmol/L (ref 22–32)
Calcium: 8 mg/dL — ABNORMAL LOW (ref 8.9–10.3)
Chloride: 92 mmol/L — ABNORMAL LOW (ref 98–111)
Creatinine, Ser: 2.42 mg/dL — ABNORMAL HIGH (ref 0.61–1.24)
GFR, Estimated: 35 mL/min — ABNORMAL LOW (ref >60)
Glucose, Bld: 257 mg/dL — ABNORMAL HIGH (ref 70–99)
Potassium: 3.5 mmol/L (ref 3.5–5.1)
Sodium: 130 mmol/L — ABNORMAL LOW (ref 135–145)

## 2021-02-13 LAB — COOXEMETRY PANEL
Carboxyhemoglobin: 1.1 % (ref 0.5–1.5)
Methemoglobin: 0.9 % (ref 0.0–1.5)
O2 Saturation: 58.5 %
Total hemoglobin: 11.3 g/dL — ABNORMAL LOW (ref 12.0–16.0)

## 2021-02-13 IMAGING — DX DG CHEST 1V PORT
1 series · 2 of 2 positions shown · non-contrast
Comparison: [DATE]

CLINICAL DATA: Shortness of breath.  PICC line placement.

EXAM:
PORTABLE CHEST 1 VIEW

[Series 1: chest · 0.14mm/px · 2 of 2 slices shown]
[im 1/2]
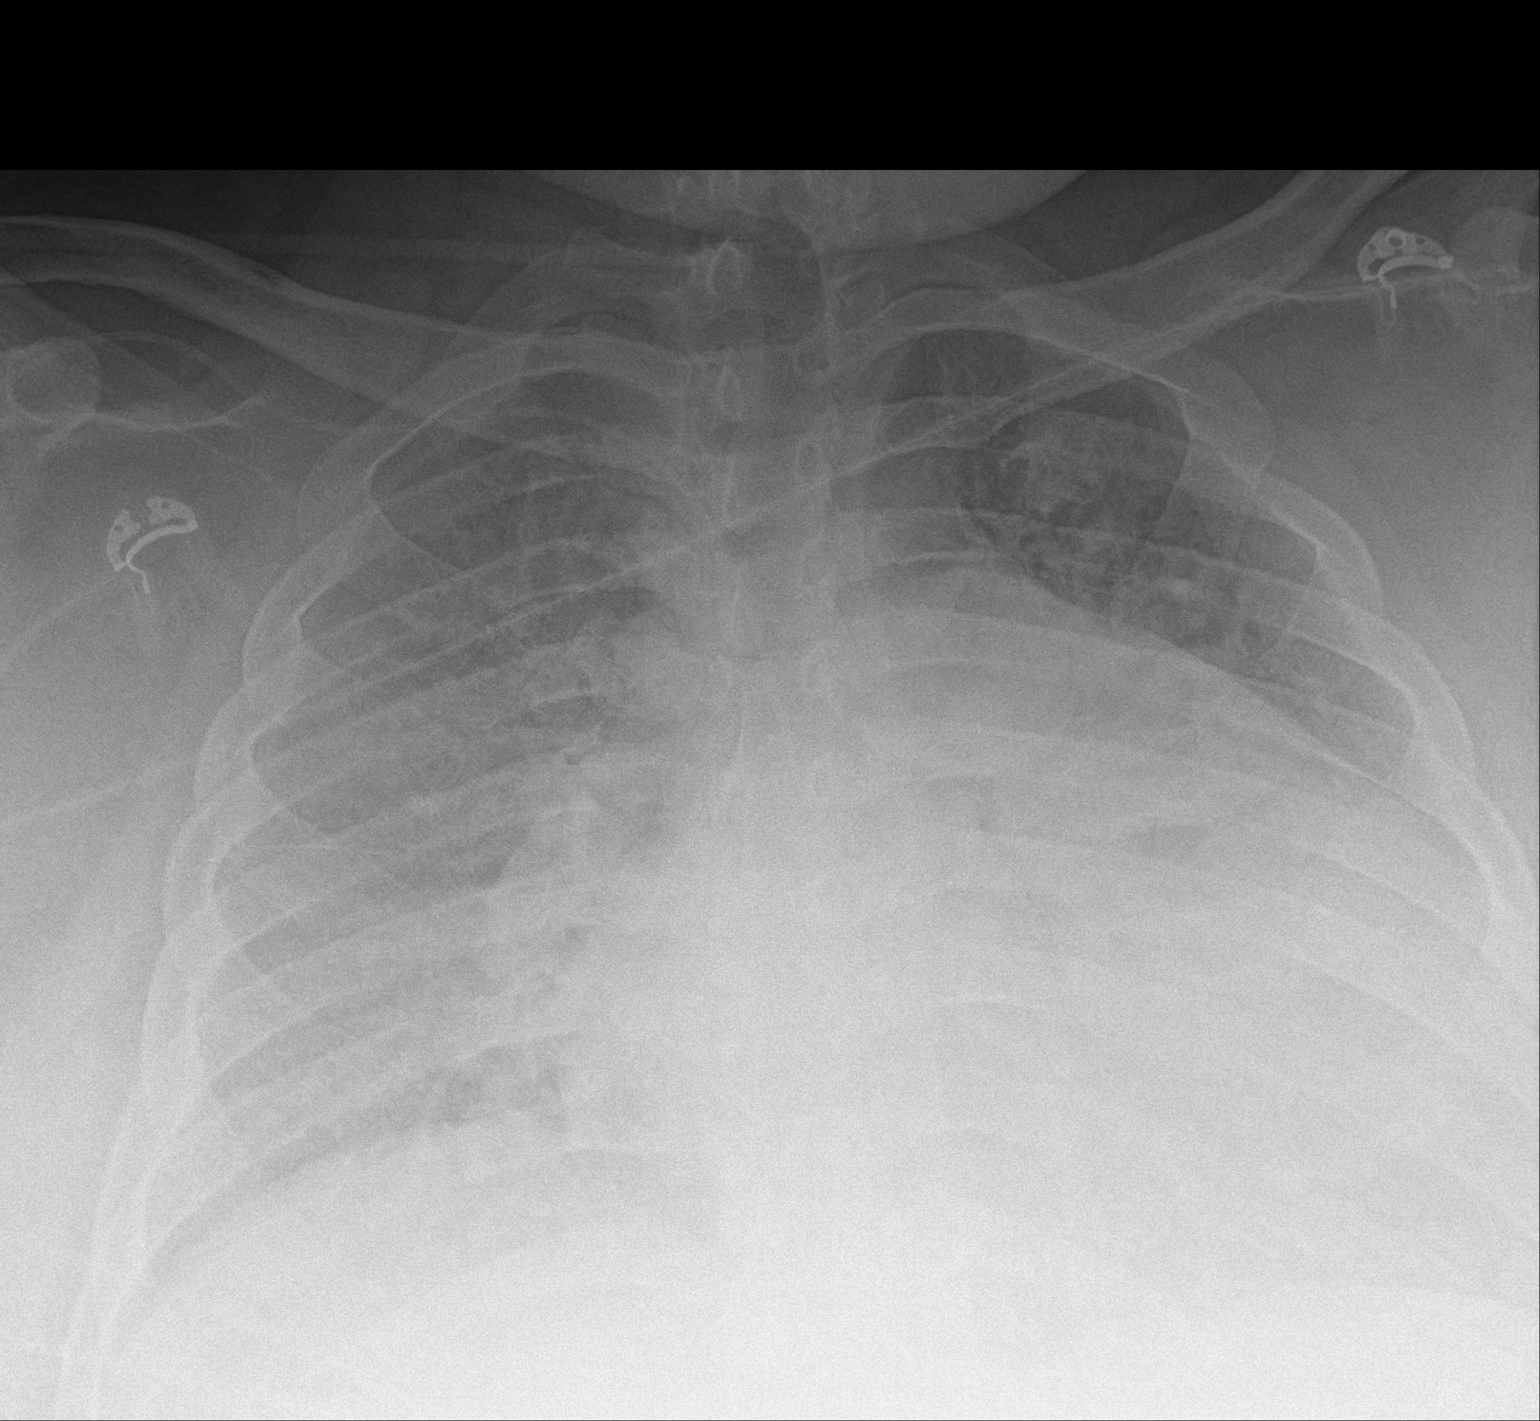
[im 2/2]
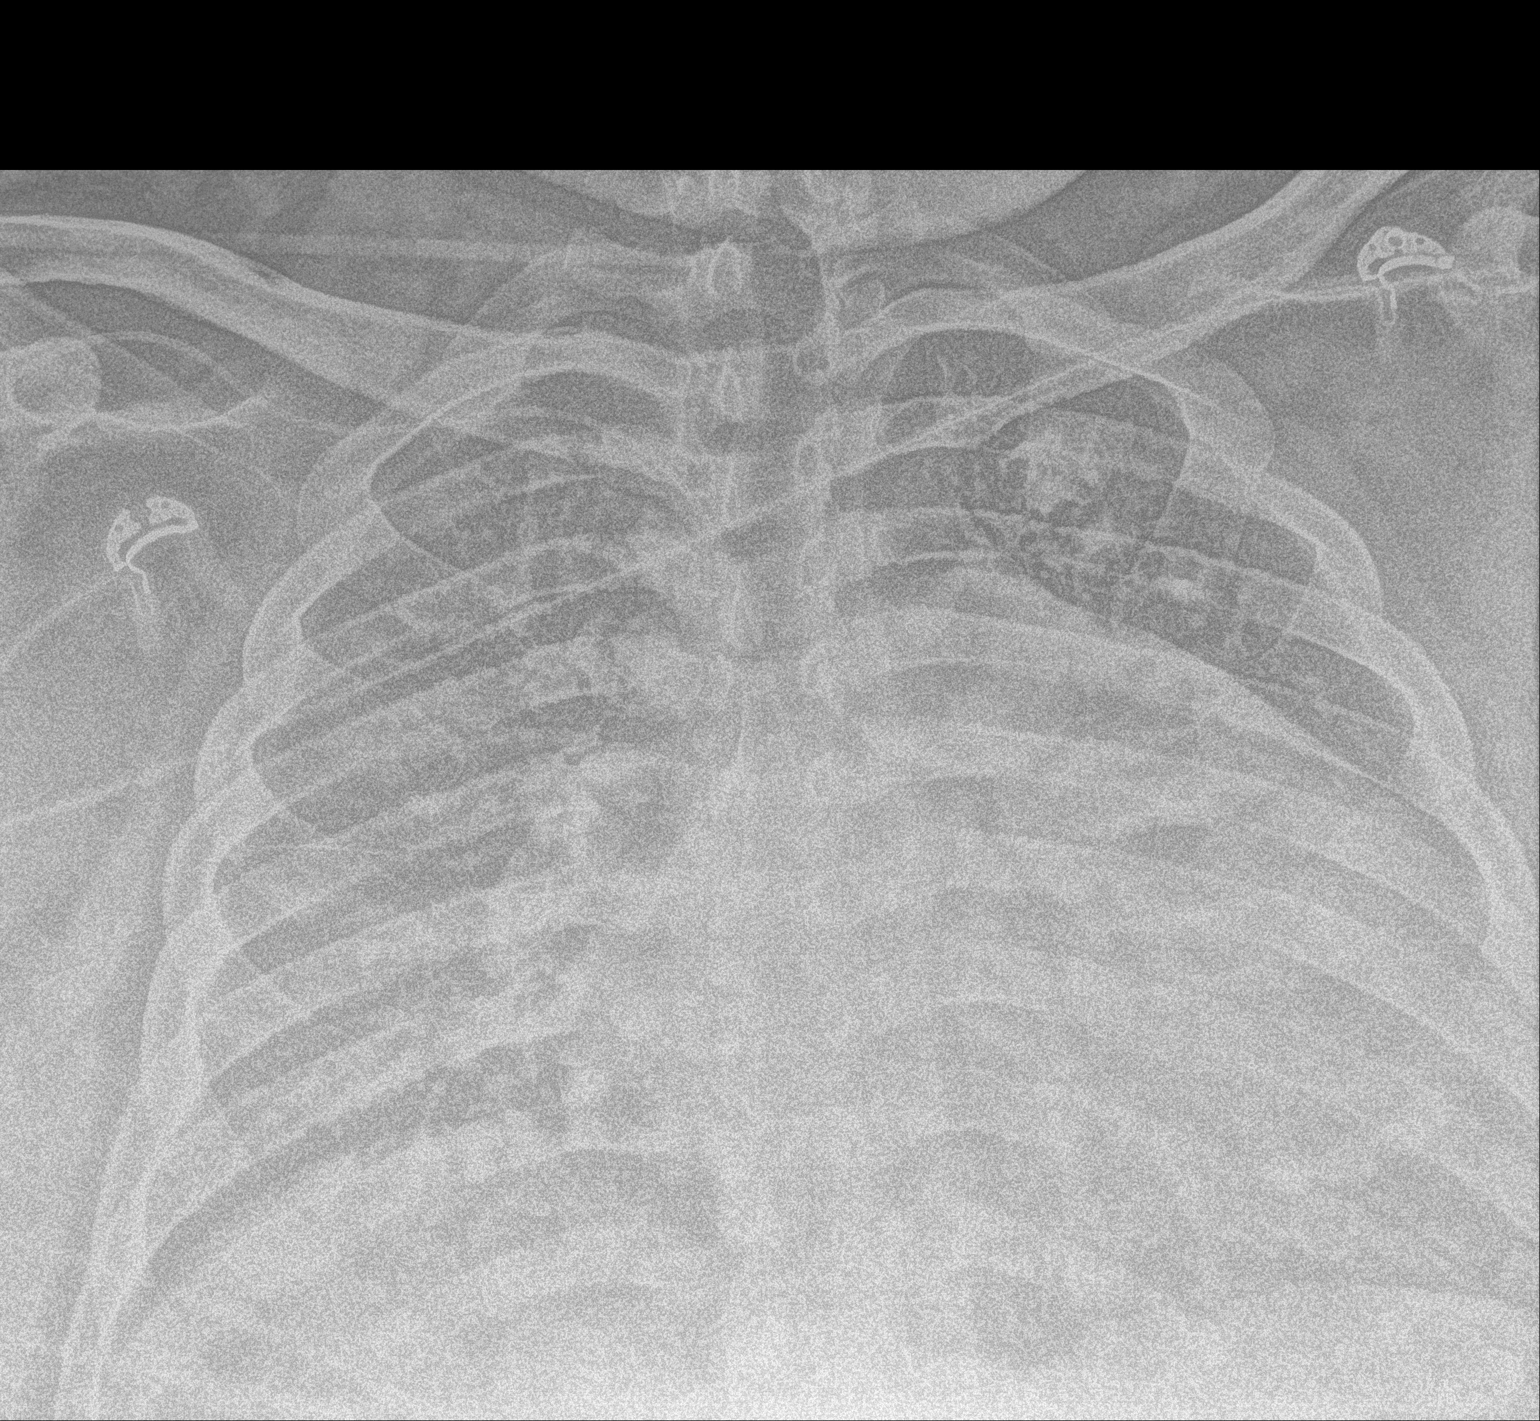

[2 of 2 positions shown; findings below may reference images not displayed]

FINDINGS: [D3] hours. Low volume lordotic film with underpenetration and
leftward rotation. The cardio pericardial silhouette is enlarged.
Vascular congestion with probable interstitial pulmonary edema
although technique on the study hinders assessment. No substantial
pleural effusion left costophrenic angle has not been included on
the study. Right PICC line tip projects over the mediastinum, just
below the carina.
IMPRESSION: Low volume lordotic film with under penetration and leftward
rotation. The right PICC line tip projects inferior to the carina
over the T6 vertebral body. Given technical limitations and patient
rotation, clinical correlation for tip position will be required. If
the PICC line is in the SVC, the position of the PICC line tip would
be in the mid SVC region.

## 2021-02-13 IMAGING — US US RENAL
1 series · 14 of 24 positions shown · non-contrast
Comparison: None.

CLINICAL DATA: Elevated creatinine, morbid obesity

EXAM:
RENAL / URINARY TRACT ULTRASOUND COMPLETE

[Series 1: us renal · 14 of 24 slices shown]
[im 1/24]
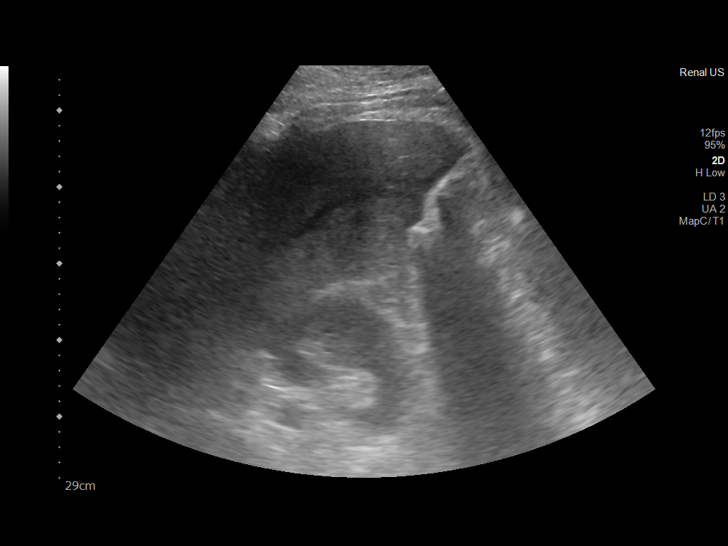
[im 3/24]
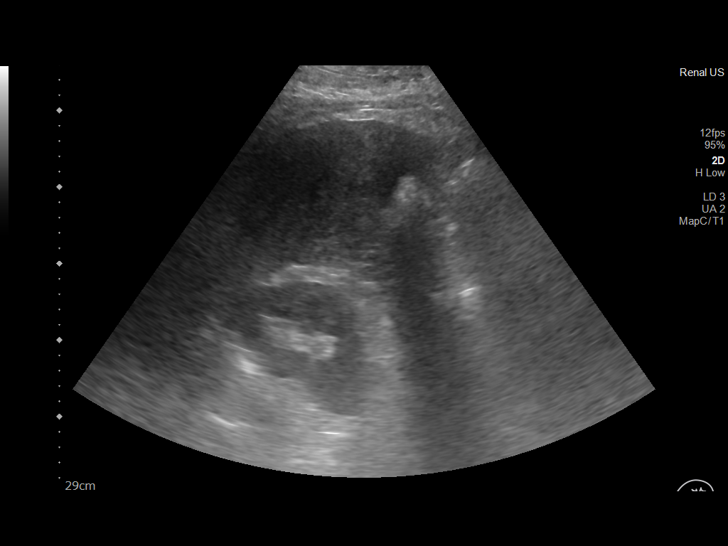
[im 5/24]
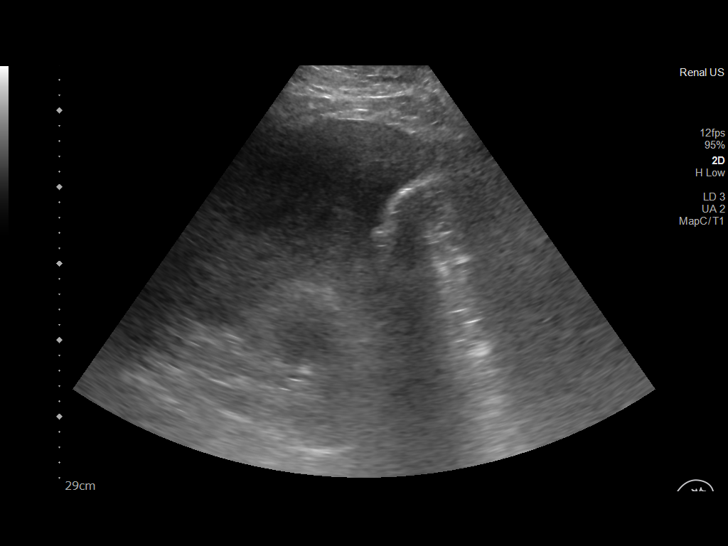
[im 7/24]
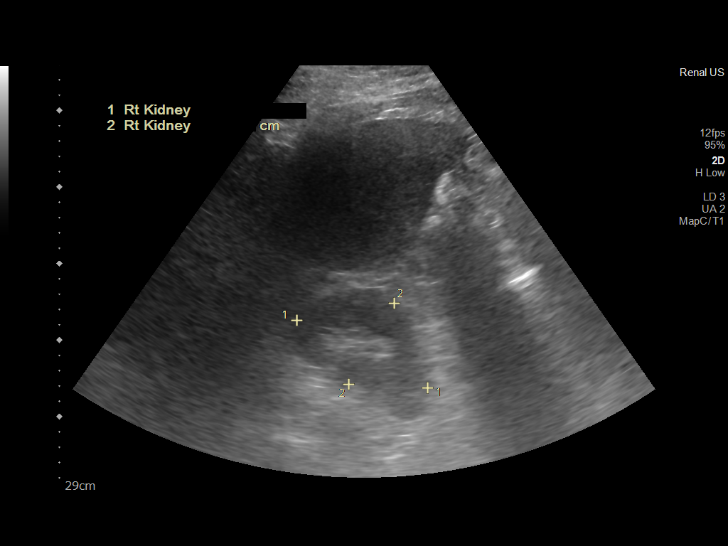
[im 8/24]
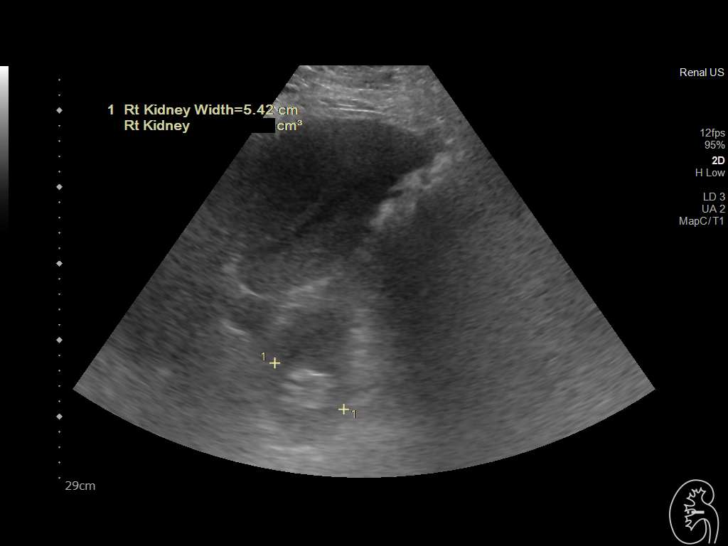
[im 10/24]
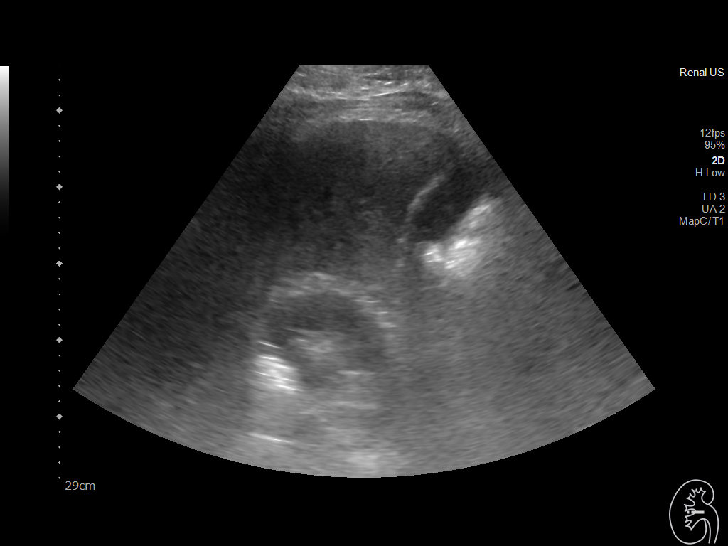
[im 12/24]
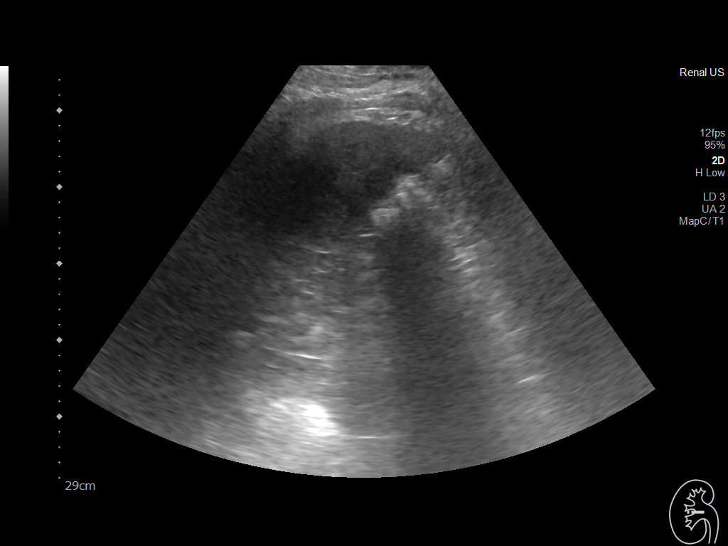
[im 13/24]
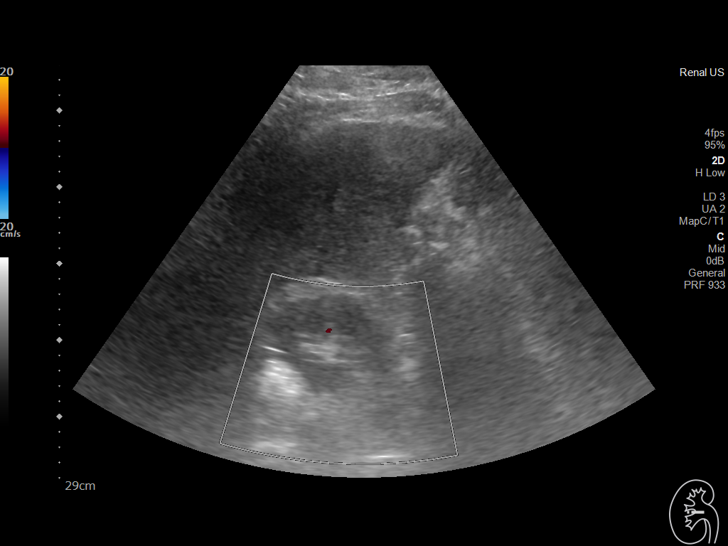
[im 15/24]
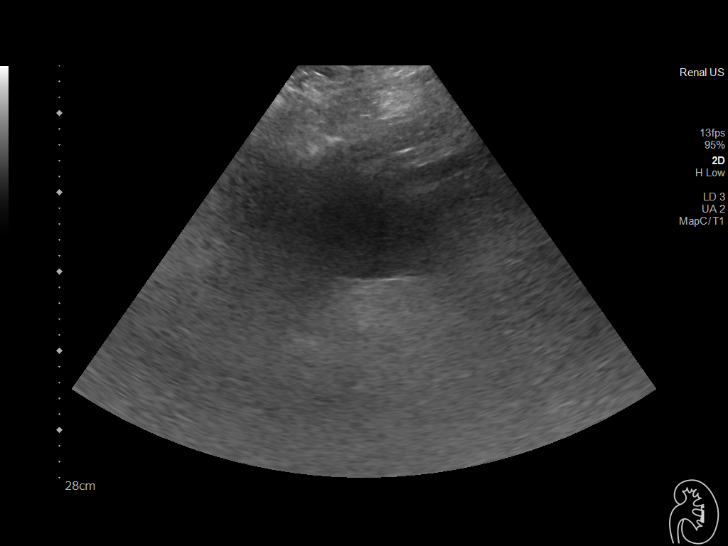
[im 17/24]
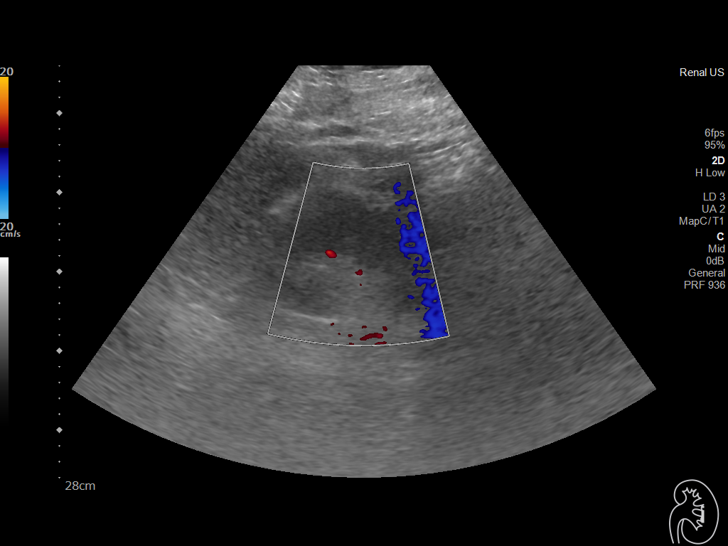
[im 19/24]
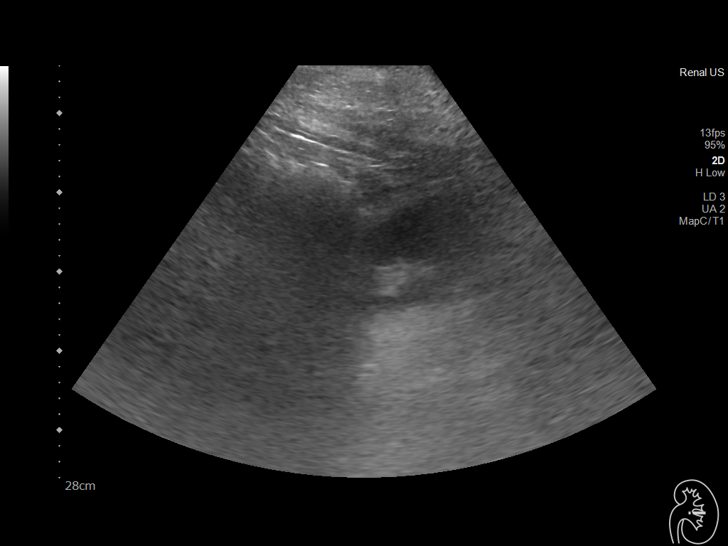
[im 20/24]
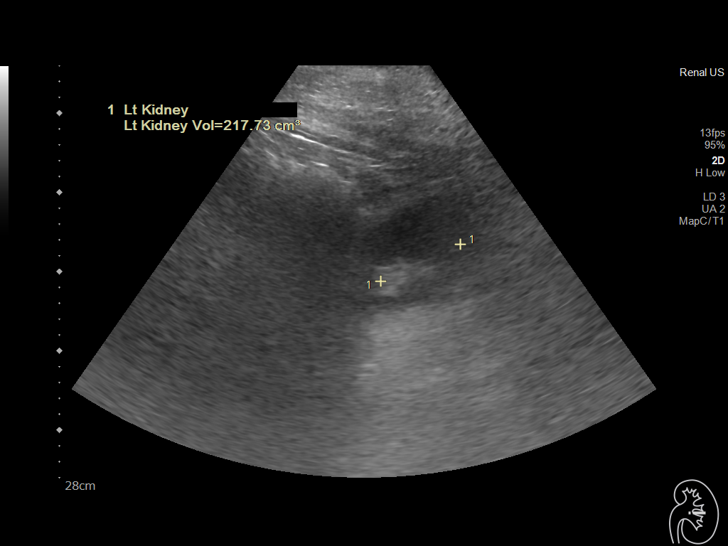
[im 22/24]
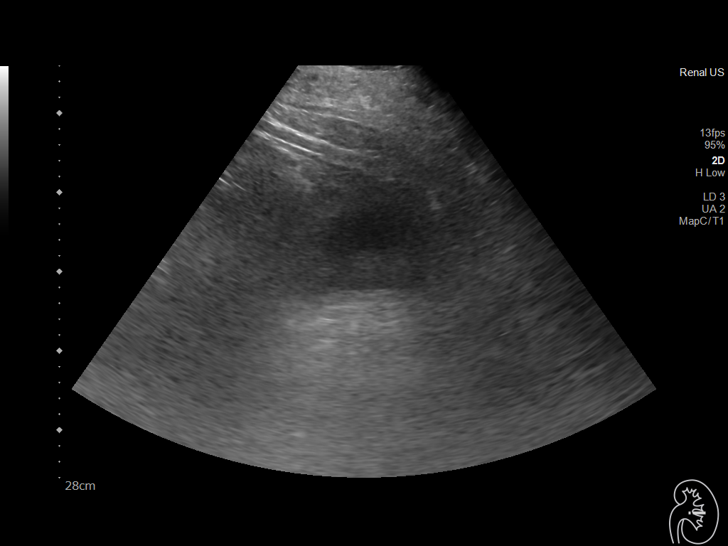
[im 24/24]
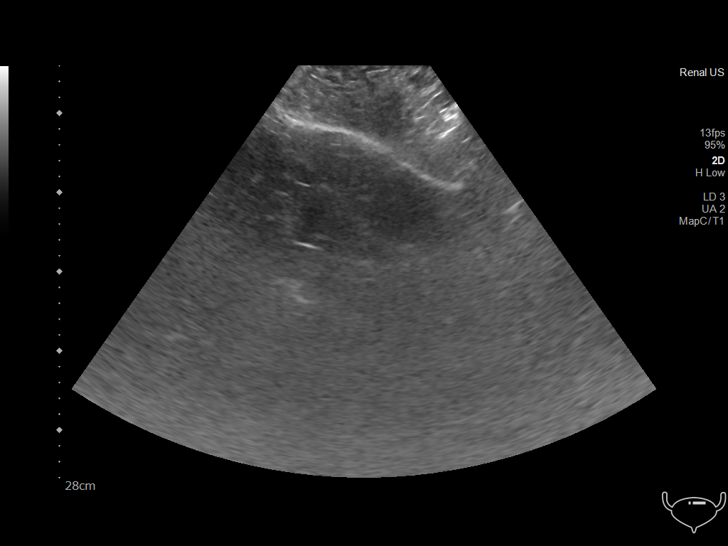

[14 of 24 positions shown; findings below may reference images not displayed]

FINDINGS: Right Kidney:

Renal measurements: 9.6 x 6.1 x 5.4 cm = volume: 165 mL.
Echogenicity within normal limits. No mass or hydronephrosis
visualized.

Left Kidney:

Renal measurements: 11.5 x 6.5 x 5.5 cm = volume: 217 mL.
Echogenicity within normal limits. No mass or hydronephrosis
visualized.

Bladder:

Appears normal for degree of bladder distention.

Other:

Limited exam because of body habitus.
IMPRESSION: Limited exam but no acute finding by ultrasound.  No hydronephrosis.

## 2021-02-13 SURGERY — CANCELLED PROCEDURE

## 2021-02-13 MED ORDER — SODIUM CHLORIDE 0.9% FLUSH
10.0000 mL | Freq: Two times a day (BID) | INTRAVENOUS | Status: DC
  Administered 2021-02-13 – 2021-02-19 (×13): 10 mL
  Administered 2021-02-19: 20 mL
  Administered 2021-02-20: 10 mL
  Administered 2021-02-20: 30 mL
  Administered 2021-02-21 – 2021-03-14 (×18): 10 mL
  Administered 2021-03-16: 40 mL
  Administered 2021-03-17: 10 mL
  Administered 2021-03-18: 20 mL
  Administered 2021-03-18 – 2021-03-22 (×8): 10 mL
  Administered 2021-03-24: 30 mL
  Administered 2021-03-24 – 2021-03-26 (×4): 10 mL

## 2021-02-13 MED ORDER — CHLORHEXIDINE GLUCONATE CLOTH 2 % EX PADS
6.0000 | MEDICATED_PAD | Freq: Every day | CUTANEOUS | Status: DC
  Administered 2021-02-13 – 2021-03-16 (×27): 6 via TOPICAL

## 2021-02-13 MED ORDER — SODIUM CHLORIDE 0.9% FLUSH
10.0000 mL | INTRAVENOUS | Status: DC | PRN
  Administered 2021-03-20: 30 mL

## 2021-02-13 MED ORDER — RIVAROXABAN 20 MG PO TABS
20.0000 mg | ORAL_TABLET | Freq: Every day | ORAL | Status: DC
  Administered 2021-02-13 – 2021-02-15 (×3): 20 mg via ORAL
  Filled 2021-02-13 (×4): qty 1

## 2021-02-13 MED ORDER — LIDOCAINE HCL URETHRAL/MUCOSAL 2 % EX GEL
1.0000 "application " | Freq: Once | CUTANEOUS | Status: AC
  Administered 2021-02-13: 1 via URETHRAL
  Filled 2021-02-13: qty 11
  Filled 2021-02-13: qty 6

## 2021-02-13 MED ORDER — SODIUM CHLORIDE 0.9 % IV SOLN
1.0000 g | INTRAVENOUS | Status: DC
  Administered 2021-02-13 – 2021-02-16 (×4): 1 g via INTRAVENOUS
  Filled 2021-02-13 (×3): qty 1
  Filled 2021-02-13: qty 10

## 2021-02-13 MED ORDER — POTASSIUM CHLORIDE CRYS ER 10 MEQ PO TBCR
30.0000 meq | EXTENDED_RELEASE_TABLET | ORAL | Status: AC
  Administered 2021-02-13 – 2021-02-14 (×2): 30 meq via ORAL
  Filled 2021-02-13 (×2): qty 1

## 2021-02-13 MED ORDER — SODIUM CHLORIDE 0.9 % IV SOLN: INTRAVENOUS | Status: DC

## 2021-02-13 MED ORDER — MAGNESIUM SULFATE 2 GM/50ML IV SOLN
2.0000 g | Freq: Once | INTRAVENOUS | Status: AC
  Administered 2021-02-13: 2 g via INTRAVENOUS
  Filled 2021-02-13: qty 50

## 2021-02-13 NOTE — Progress Notes (Signed)
foley cath insertion with lidocaine gel for lubrication, patient refused. MD notified

## 2021-02-13 NOTE — Progress Notes (Signed)
Patient transferred in bed to [facility] ICU at 0900hrs.  Notified Ken RN 2H that PICC line to be placed 0930hrs.  Patient notified family of transfer.

## 2021-02-13 NOTE — Progress Notes (Signed)
TOC CM at bedside to speak to patient to see if he wanted Chaplain to come and visit. Pt will transferred to [place] and was talking to his mother on phone. Requested CM speak to mother, [name]. Contacted pt's mother, [name]. Mother requested Chaplain come to see patient. Provided her with contact number and she will contact CM when she comes. Referral will sent to Chaplain. [place], [place] ED TOC CM [phone]

## 2021-02-13 NOTE — Progress Notes (Signed)
Peripherally Inserted Central Catheter Placement  The IV Nurse has discussed with the patient and/or persons authorized to consent for the patient, the purpose of this procedure and the potential benefits and risks involved with this procedure.  The benefits include less needle sticks, lab draws from the catheter, and the patient may be discharged home with the catheter. Risks include, but not limited to, infection, bleeding, blood clot (thrombus formation), and puncture of an artery; nerve damage and irregular heartbeat and possibility to perform a PICC exchange if needed/ordered by physician.  Alternatives to this procedure were also discussed.  Bard Power PICC patient education guide, fact sheet on infection prevention and patient information card has been provided to patient /or left at bedside.    PICC Placement Documentation  PICC Triple Lumen 02/13/21 PICC Right Cephalic 47 cm 0 cm (Active)  Site Assessment Clean;Dry;Intact 02/13/21 1025  Lumen #1 Status Flushed;Blood return noted 02/13/21 1025  Lumen #2 Status Flushed 02/13/21 1025  Lumen #3 Status Flushed;Blood return noted 02/13/21 1025  Dressing Type Transparent 02/13/21 1025  Dressing Status Clean;Intact;Dry 02/13/21 1025  Antimicrobial disc in place? Yes 02/13/21 1025  Dressing Intervention New dressing;Other (Comment) 02/13/21 1025  Dressing Change Due 02/20/21 02/13/21 1025       Phillip Young 02/13/2021, 10:27 AM

## 2021-02-13 NOTE — Progress Notes (Signed)
Patient seen by attending MD, PICC line reviewed, blood backflow and CVP waveform measurement obtained from PICC. PICC line can be used as per attending

## 2021-02-13 NOTE — Progress Notes (Signed)
Patient refused to let me collect the ABG. Tried to convince him multiple times to let me try, and patient continued to refused. RN aware.

## 2021-02-13 NOTE — Plan of Care (Signed)
  Problem: Education: Goal: Knowledge of General Education information will improve Description: Including pain rating scale, medication(s)/side effects and non-pharmacologic comfort measures Outcome: Progressing   Problem: Activity: Goal: Risk for activity intolerance will decrease Outcome: Progressing   Problem: Coping: Goal: Level of anxiety will decrease Outcome: Progressing   

## 2021-02-13 NOTE — Progress Notes (Signed)
   02/13/21 1045  Clinical Encounter Type  Visited With Patient  Visit Type Spiritual support  Referral From Nurse  Consult/Referral To Chaplain   Chaplain responded to consult request for prayer. The patient was asleep and there were no support persons present. I prayed at the patient's bedside. This note was prepared by Jeanine Luz, M.Div..  For questions please contact by phone [phone].

## 2021-02-13 NOTE — Progress Notes (Signed)
PT placed on cpap for the night. 

## 2021-02-13 NOTE — Progress Notes (Signed)
PROGRESS NOTE    Phillip Young  VXB:[id number] DOB: Feb 06, 1986 DOA: 02/11/2021 PCP: Janith Lima, MD    Chief Complaint  Patient presents with   Shortness of Breath    Brief Narrative:  Phillip Young is a 35 y.o. male with medical history significant of biventricular heart failure, morbid obesity, HTN, GERD, OSA, prediabetes who presented with shortness of breath and palpitations x 1 week, He checked his pulse ox at home and his oxygen was 68% and heart rate was 150 ED Course: afebrile, bp: 112/91, HR: 131, RR: 26, oxygen: 88-91% room air-->2L 94% He is admitted for acute heart failure acute hypoxic respiratory failure, a flutter RVR Heart failure team consulted  Subjective:  He was started on dobutamine last night, he is transferred to  2heart/cardiac floor  He reports chest pressure last night , lasted several hours, also has left arm heaviness x2 last night He now has a picc line placed  Currently he is sitting up at bedside, denies pain He refused foley insertion, he refused abg testing last night   Assessment & Plan:   Principal Problem:   Acute on chronic systolic (congestive) heart failure ([place]) Active Problems:   Morbid obesity with body mass index (BMI) of 60.0 to 69.9 in adult [facility])   Essential hypertension   GERD without esophagitis   OSA (obstructive sleep apnea)   Prediabetes   Atrial flutter with rapid ventricular response ([name])   Elevated troponin   Respiratory failure with hypoxia ([name])   Acute on chronic biventricular heart failure exacerbation/Acute hypoxic respiratory failure -LVEF less than 20% -Elevated troponin likely demand ischemia in the setting of heart failure and a flutter, has cath 8/21 showed no CAD -He did not appear to respond IV Lasix , now on Lasix drip -he is started on dobutamine drip on 7/21-7/22 night  -spironolactone ,Delene Loll, Farxiga, digoxin discontinued due to worsening of cr -PICC placed to monitor CVP and venous O2  sat -management per  heart failure team   A flutter RVR Amiodarone drip, was on Eliquis, now [place] for cardioversion Management per cardiology  Elevation of LFTs Likely due to liver congestion Improving  AKI on KD 2 Ua with rae bacteria, not sure if clean cath, urine culture ordered, rocephin per cardiology  He refused foley insertion Bladder scan /renal uls Renal dosing meds  Non-insulin-dependent type 2 diabetes A1c 6.5 Was on Farxiga  On amoxicillin after recent teeth extraction Continue to finish course  Morbid obesity : Body mass index is 66.75 kg/m.. OSA, no longer use BiPAP due to cost, appreciate case manager management, hopefully can get him home on NIV, as he likely has obesity hypoventilation syndrome as well Will likely need home O2 as well Will provide weight management clinic information and dietitian information to the patient      Unresulted Labs (From admission, onward)     Start     Ordered   02/14/21 0500  .Cooxemetry Panel (carboxy, met, total hgb, O2 sat)  Daily,   R      02/13/21 0829   02/14/21 0500  CBC  Daily,   R      02/13/21 0829   02/13/21 3300  Basic metabolic panel  Once-Timed,   TIMED        02/13/21 1318   02/13/21 1100  .Cooxemetry Panel (carboxy, met, total hgb, O2 sat)  Once,   R       Comments: After PICC placed    02/13/21 0829  02/13/21 0500  Blood gas, arterial  Tomorrow morning,   R        02/12/21 1508   02/13/21 3790  Basic metabolic panel  Daily,   R      02/12/21 1826   02/12/21 1823  Urine Culture  Once,   R       Question:  Indication  Answer:  Urgency/frequency   02/12/21 1823              DVT prophylaxis:  apixaban (ELIQUIS) tablet 5 mg   Code Status: Full Family Communication: Patient Disposition:   Status is: Inpatient   Dispo: The patient is from: Home              Anticipated d/c is to: Home              Anticipated d/c date is: To be determined                Consultants:   Cardiology/heart failure team  Procedures:  PICC line placement  Antimicrobials:    Anti-infectives (From admission, onward)    Start     Dose/Rate Route Frequency Ordered Stop   02/13/21 0900  cefTRIAXone (ROCEPHIN) 1 g in sodium chloride 0.9 % 100 mL IVPB        1 g 200 mL/hr over 30 Minutes Intravenous Every 24 hours 02/13/21 0810     02/12/21 1000  amoxicillin (AMOXIL) capsule 500 mg  Status:  Discontinued        500 mg Oral 3 times daily 02/12/21 0038 02/13/21 0816           Objective: Vitals:   02/13/21 1000 02/13/21 1100 02/13/21 1200 02/13/21 1300  BP: 126/70 (!) 77/47 (!) 93/51 (!) 121/105  Pulse: (!) 117 (!) 117 (!) 118 92  Resp: (!) 22 (!) _0 Temp:   98.1 F (36.7 C)   TempSrc:   Oral   SpO2: 95% 95% 95% 98%  Weight:      Height:        Intake/Output Summary (Last 24 hours) at 02/13/2021 1346 Last data filed at 02/13/2021 1200 Gross per 24 hour  Intake --  Output 650 ml  Net -650 ml   Filed Weights   02/12/21 0025 02/13/21 0501  Weight: (!) 215.5 kg (!) 217.1 kg    Examination:  General exam: calm, NAD, obese Respiratory system:  difficult to auscultate, diminished, no wheezing /rales/rhonchi, Respiratory effort normal. Cardiovascular system: IRRR. +pedal edema. Gastrointestinal system: Abdomen is nondistended, soft and nontender.+ bowel sounds  Central nervous system: Alert and oriented. No focal neurological deficits. Extremities: Symmetric 5 x 5 power. Skin: No rashes, lesions or ulcers Psychiatry: Judgement and insight appear normal. Mood & affect appropriate.     Data Reviewed: I have personally reviewed following labs and imaging studies  CBC: Recent Labs  Lab 02/11/21 1208  WBC 10.3  NEUTROABS 8.1*  HGB 13.5  HCT 42.9  MCV 93.1  PLT 240    Basic Metabolic Panel: Recent Labs  Lab 02/11/21 1208 02/11/21 2100 02/12/21 0413 02/13/21 0146  NA 135  --  132* 132*  K 3.6  --  3.6 4.1  CL 99  --  97* 97*  CO2 25  --   23 24  GLUCOSE 127*  --  144* 106*  BUN 10  --  13 22*  CREATININE 1.14  --  1.24 2.73*  CALCIUM 8.6*  --  8.8* 8.5*  MG  --  1.8  --  1.8    GFR: Estimated Creatinine Clearance: 70.5 mL/min (A) (by C-G formula based on SCr of 2.73 mg/dL (H)).  Liver Function Tests: Recent Labs  Lab 02/11/21 1208 02/12/21 0413  AST 44* 35  ALT 52* 47*  ALKPHOS 68 69  BILITOT 2.1* 1.9*  PROT 7.7 7.9  ALBUMIN 3.4* 3.4*    CBG: No results for input(s): GLUCAP in the last 168 hours.   Recent Results (from the past 240 hour(s))  Resp Panel by RT-PCR (Flu A&B, Covid) Nasopharyngeal Swab     Status: None   Collection Time: 02/11/21  2:45 PM   Specimen: Nasopharyngeal Swab; Nasopharyngeal(NP) swabs in vial transport medium  Result Value Ref Range Status   SARS Coronavirus 2 by RT PCR NEGATIVE NEGATIVE Final    Comment: (NOTE) SARS-CoV-2 target nucleic acids are NOT DETECTED.  The SARS-CoV-2 RNA is generally detectable in upper respiratory specimens during the acute phase of infection. The lowest concentration of SARS-CoV-2 viral copies this assay can detect is 138 copies/mL. A negative result does not preclude SARS-Cov-2 infection and should not be used as the sole basis for treatment or other patient management decisions. A negative result may occur with  improper specimen collection/handling, submission of specimen other than nasopharyngeal swab, presence of viral mutation(s) within the areas targeted by this assay, and inadequate number of viral copies(<138 copies/mL). A negative result must be combined with clinical observations, patient history, and epidemiological information. The expected result is Negative.  Fact Sheet for Patients:  EntrepreneurPulse.com.au  Fact Sheet for Healthcare Providers:  IncredibleEmployment.be  This test is no t yet approved or cleared by the [place] FDA and  has been authorized for detection and/or diagnosis  of SARS-CoV-2 by FDA under an Emergency Use Authorization (EUA). This EUA will remain  in effect (meaning this test can be used) for the duration of the COVID-19 declaration under Section 564(b)(1) of the Act, 21 U.S.C.section 360bbb-3(b)(1), unless the authorization is terminated  or revoked sooner.       Influenza A by PCR NEGATIVE NEGATIVE Final   Influenza B by PCR NEGATIVE NEGATIVE Final    Comment: (NOTE) The Xpert Xpress SARS-CoV-2/FLU/RSV plus assay is intended as an aid in the diagnosis of influenza from Nasopharyngeal swab specimens and should not be used as a sole basis for treatment. Nasal washings and aspirates are unacceptable for Xpert Xpress SARS-CoV-2/FLU/RSV testing.  Fact Sheet for Patients: EntrepreneurPulse.com.au  Fact Sheet for Healthcare Providers: IncredibleEmployment.be  This test is not yet approved or cleared by the [place] FDA and has been authorized for detection and/or diagnosis of SARS-CoV-2 by FDA under an Emergency Use Authorization (EUA). This EUA will remain in effect (meaning this test can be used) for the duration of the COVID-19 declaration under Section 564(b)(1) of the Act, 21 U.S.C. section 360bbb-3(b)(1), unless the authorization is terminated or revoked.  Performed at [facility], [place] [address], [place], [place] [address]          Radiology Studies: DG Chest Port 1 View  Result Date: 02/13/2021 CLINICAL DATA:  Shortness of breath.  PICC line placement. EXAM: PORTABLE CHEST 1 VIEW COMPARISON:  02/11/2021 FINDINGS: 1035 hours. Low volume lordotic film with underpenetration and leftward rotation. The cardio pericardial silhouette is enlarged. Vascular congestion with probable interstitial pulmonary edema although technique on the study hinders assessment. No substantial pleural effusion left costophrenic angle has not been included on the study. Right PICC line tip projects over the  mediastinum, just below the carina. IMPRESSION: Low volume lordotic film with under penetration and leftward rotation. The right PICC line tip projects inferior to the carina over the T6 vertebral body. Given technical limitations and patient rotation, clinical correlation for tip position will be required. If the PICC line is in the SVC, the position of the PICC line tip would be in the mid SVC region. Electronically Signed   By: Misty Stanley M.D.   On: 02/13/2021 11:01   ECHOCARDIOGRAM COMPLETE  Result Date: 02/12/2021    ECHOCARDIOGRAM REPORT   Patient Name:   KEAHI MCCARNEY Date of Exam: 02/12/2021 Medical Rec #:  [id number]      Height:       71.0 in Accession #:    [id number]     Weight:       475.1 lb Date of Birth:  [birth year]       BSA:          3.046 m Patient Age:    54 years       BP:           126/70 mmHg Patient Gender: M              HR:           122 bpm. Exam Location:  Inpatient Procedure: 2D Echo, Color Doppler, Cardiac Doppler and Intracardiac            Opacification Agent Indications:    H37.16 Acute systolic (congestive) heart failure  History:        Patient has prior history of Echocardiogram examinations, most                 recent 06/17/2020. CHF; Risk Factors:Hypertension and Sleep                 Apnea.  Sonographer:    Raquel Sarna Senior RDCS Referring Phys: [id number] Orma Flaming  Sonographer Comments: Very technically difficult study. Patient is morbidly obese, scanned upright in recliner due to dyspnea. IMPRESSIONS  1. Left ventricular ejection fraction, by estimation, is <20%. The left ventricle has severely decreased function. Left ventricular endocardial border not optimally defined to evaluate regional wall motion. The left ventricular internal cavity size was severely dilated. Left ventricular diastolic parameters are indeterminate.  2. Right ventricular systolic function was not well visualized. The right ventricular size is normal. Tricuspid regurgitation signal is inadequate for  assessing PA pressure.  3. Left atrial size was moderately dilated.  4. The mitral valve was not well visualized. Trivial mitral valve regurgitation.  5. The aortic valve is tricuspid. Aortic valve regurgitation is not visualized. Comparison(s): A prior study was performed on 06/17/2020. LVEF is severely reduced; difficult comparison (this is a very technically difficult study). FINDINGS  Left Ventricle: Left ventricular ejection fraction, by estimation, is <20%. The left ventricle has severely decreased function. Left ventricular endocardial border not optimally defined to evaluate regional wall motion. Definity contrast agent was given  IV to delineate the left ventricular endocardial borders. The left ventricular internal cavity size was severely dilated. There is no left ventricular hypertrophy. Left ventricular diastolic parameters are indeterminate. Right Ventricle: The right ventricular size is normal. Right vetricular wall thickness was not well visualized. Right ventricular systolic function was not well visualized. Tricuspid regurgitation signal is inadequate for assessing PA pressure. Left Atrium: Left atrial size was moderately dilated. Right Atrium: Right atrial size was normal in size. Pericardium: There is no evidence of pericardial effusion. Mitral Valve: The mitral  valve was not well visualized. Trivial mitral valve regurgitation. Tricuspid Valve: The tricuspid valve is not well visualized. Tricuspid valve regurgitation is not demonstrated. No evidence of tricuspid stenosis. Aortic Valve: The aortic valve is tricuspid. Aortic valve regurgitation is not visualized. Pulmonic Valve: The pulmonic valve was not well visualized. Pulmonic valve regurgitation is not visualized. Aorta: The aortic root and ascending aorta are structurally normal, with no evidence of dilitation. IAS/Shunts: The interatrial septum was not well visualized.  LEFT VENTRICLE PLAX 2D LVIDd:         7.20 cm LVIDs:         6.70 cm LV  PW:         0.90 cm LV IVS:        0.70 cm LVOT diam:     2.50 cm LV SV:         32 LV SV Index:   11 LVOT Area:     4.91 cm  RIGHT VENTRICLE RV S prime:     6.09 cm/s TAPSE (M-mode): 0.7 cm LEFT ATRIUM            Index       RIGHT ATRIUM           Index LA diam:      4.40 cm  1.44 cm/m  RA Area:     20.70 cm LA Vol (A4C): 131.0 ml 43.01 ml/m RA Volume:   55.60 ml  18.26 ml/m  AORTIC VALVE LVOT Vmax:   54.30 cm/s LVOT Vmean:  37.700 cm/s LVOT VTI:    0.066 m  AORTA Ao Root diam: 3.00 cm Ao Asc diam:  3.00 cm  SHUNTS Systemic VTI:  0.07 m Systemic Diam: 2.50 cm Rudean Haskell MD Electronically signed by Rudean Haskell MD Signature Date/Time: 02/12/2021/3:50:48 PM    Final    [place] EKG SITE RITE  Result Date: 02/13/2021 If Site Rite image not attached, placement could not be confirmed due to current cardiac rhythm.  [place] EKG SITE RITE  Result Date: 02/12/2021 If Site Rite image not attached, placement could not be confirmed due to current cardiac rhythm.       Scheduled Meds:  apixaban  5 mg Oral BID   Chlorhexidine Gluconate Cloth  6 each Topical Daily   sodium chloride flush  10-40 mL Intracatheter Q12H   sodium chloride flush  3 mL Intravenous Q12H   Continuous Infusions:  sodium chloride     amiodarone 60 mg/hr (02/13/21 1317)   cefTRIAXone (ROCEPHIN)  IV     DOBUTamine 5 mcg/kg/min (02/13/21 1311)   furosemide (LASIX) 200 mg in dextrose 5% 100 mL (38m/mL) infusion 20 mg/hr (02/13/21 0432)   magnesium sulfate bolus IVPB 2 g (02/13/21 1307)     LOS: 2 days   Time spent: 376ms Greater than 50% of this time was spent in counseling, explanation of diagnosis, planning of further management, and coordination of care.   Voice Recognition /DViviann Spareictation system was used to create this note, attempts have been made to correct errors. Please contact the author with questions and/or clarifications.   FaFlorencia ReasonsMD PhD FACP [facility]  Available via Epic secure chat  7am-7pm for nonurgent issues Please page for urgent issues To page the attending provider between 7A-7P or the covering provider during after hours 7P-7A, please log into the web site [email or web address] and access using universal Bureau password for that web site. If you do not have the password, please call the hospital operator.  02/13/2021, 1:46 PM

## 2021-02-13 NOTE — Progress Notes (Signed)
Patient's BP was 73/40, on Dobutamine drip.  Rapid response called to see the patient.  Patient is resting comfortably in the chair with CPAP machine on.  Patient is not in respiratory distress at this time.  Patient has not voided all shift even with Lasix drip.  Bladder scan performed but unsuccessful.  I spoke with patient about an in and out cath so that we can tell how much urine he might be retaining.  Patient is not agreeable to an in and out cath at this time.  Education provided to patient on the importance of accurate input and output.  MD notified. Will continue to monitor.    Donah Driver, RN

## 2021-02-13 NOTE — Progress Notes (Signed)
Report received from night shift RN. Patient refused in/out cath and has had no urine output for 24h.  Refused ABG and refused cardioversion at this time.  Order for Mag IV, patient has 2 IV sites being used for IV lasix, IV amiodarone and IV dobutamine.  IV team order for 3rd IV.  Dr. Erlinda Hong updated on current situation.  Cardiology paged and will come see patient.

## 2021-02-13 NOTE — Progress Notes (Signed)
Patient states that he refuses to have an in and out cath.  I educated him on the importance of getting the in and out cath.  Patient has not voided in 24 hrs.  Patient states that he is aware that he has not voided but states that "the urine will come when it gets ready".  Patient also states that he does not want to have a cardioversion done today.  Patient states that he wants to feel better before he does any type of invasive treatment.  I educated the patient on the patient on the importance of the procedure, patient was still not agreeable. Spoke with on-call MD concerning the patient refusing the in and cath, the cardioversion, and the ABG.  Will continue to monitor.   Donah Driver, RN

## 2021-02-13 NOTE — Progress Notes (Signed)
Patient ID: Phillip Young, male   DOB: [birth year], 35 y.o.   MRN: [id number]     Advanced Heart Failure Rounding Note  PCP-Cardiologist: None   Subjective:    Patient remains in atrial flutter rate 110s on amiodarone 60 mg/hr.    Overnight, no urine output.  Foley recommended but patient refused.  He was hypotensive overnight and dobutamine 2.5 was started.  He is on Lasix gtt 20 mg/hr, finally voided about 225 cc on his own this morning.  Patient did not get CPAP yesterday as he could not lie flat. Creatinine 1.24 => 2.73.   Today, he is reclining in chair on CPAP.  Comfortable currently.    UA concerning for UTI.   Echo: EF < 20%, severe LV dilation, RV not well visualized.   Objective:   Weight Range: (!) 217.1 kg Body mass index is 66.75 kg/m.   Vital Signs:   Temp:  [97 F (36.1 C)-98.4 F (36.9 C)] 97.6 F (36.4 C) (07/22 0501) Pulse Rate:  [84-127] 117 (07/22 0520) Resp:  [20-35] 29 (07/22 0520) BP: (73-202)/(40-161) 100/49 (07/22 0520) SpO2:  [85 %-100 %] 93 % (07/22 0520) Weight:  [217.1 kg] 217.1 kg (07/22 0501) Last BM Date: 02/12/21  Weight change: Filed Weights   02/12/21 0025 02/13/21 0501  Weight: (!) 215.5 kg (!) 217.1 kg    Intake/Output:   Intake/Output Summary (Last 24 hours) at 02/13/2021 0825 Last data filed at 02/12/2021 1204 Gross per 24 hour  Intake 360 ml  Output --  Net 360 ml      Physical Exam    General:  Well appearing. No resp difficulty HEENT: Normal Neck: Thick, JVP 16 cm. Carotids 2+ bilat; no bruits. No lymphadenopathy or thyromegaly appreciated. Cor: PMI nondisplaced. Tachy, regular rate & rhythm. No rubs, gallops or murmurs. Lungs: Clear Abdomen: Soft, nontender, nondistended. No hepatosplenomegaly. No bruits or masses. Good bowel sounds. Extremities: No cyanosis, clubbing, rash.  2+ edema to knees Neuro: Alert & orientedx3, cranial nerves grossly intact. moves all 4 extremities w/o difficulty. Affect  pleasant   Telemetry   Atrial flutter rate 110s  (personally reviewed)  Labs    CBC Recent Labs    02/11/21 1208  WBC 10.3  NEUTROABS 8.1*  HGB 13.5  HCT 42.9  MCV 93.1  PLT [id number]   Basic Metabolic Panel Recent Labs    02/11/21 2100 02/12/21 0413 02/13/21 0146  NA  --  132* 132*  K  --  3.6 4.1  CL  --  97* 97*  CO2  --  23 24  GLUCOSE  --  144* 106*  BUN  --  13 22*  CREATININE  --  1.24 2.73*  CALCIUM  --  8.8* 8.5*  MG 1.8  --  1.8   Liver Function Tests Recent Labs    02/11/21 1208 02/12/21 0413  AST 44* 35  ALT 52* 47*  ALKPHOS 68 69  BILITOT 2.1* 1.9*  PROT 7.7 7.9  ALBUMIN 3.4* 3.4*   No results for input(s): LIPASE, AMYLASE in the last 72 hours. Cardiac Enzymes No results for input(s): CKTOTAL, CKMB, CKMBINDEX, TROPONINI in the last 72 hours.  BNP: BNP (last 3 results) Recent Labs    10/10/20 1336 11/07/20 0932 02/11/21 1500  BNP 126.9* 106.0* 194.3*    ProBNP (last 3 results) No results for input(s): PROBNP in the last 8760 hours.   D-Dimer Recent Labs    02/11/21 1500  DDIMER 0.59*   Hemoglobin A1C Recent Labs  02/11/21 2100  HGBA1C 6.5*   Fasting Lipid Panel No results for input(s): CHOL, HDL, LDLCALC, TRIG, CHOLHDL, LDLDIRECT in the last 72 hours. Thyroid Function Tests Recent Labs    02/11/21 2100  TSH 3.693    Other results:   Imaging    ECHOCARDIOGRAM COMPLETE  Result Date: 02/12/2021    ECHOCARDIOGRAM REPORT   Patient Name:   Phillip Young Date of Exam: 02/12/2021 Medical Rec #:  [id number]      Height:       71.0 in Accession #:    [id number]     Weight:       475.1 lb Date of Birth:  October 27, 1985       BSA:          3.046 m Patient Age:    45 years       BP:           126/70 mmHg Patient Gender: M              HR:           122 bpm. Exam Location:  Inpatient Procedure: 2D Echo, Color Doppler, Cardiac Doppler and Intracardiac            Opacification Agent Indications:    [id number] Acute systolic (congestive)  heart failure  History:        Patient has prior history of Echocardiogram examinations, most                 recent 06/17/2020. CHF; Risk Factors:Hypertension and Sleep                 Apnea.  Sonographer:    Raquel Sarna Senior RDCS Referring Phys: [id number] Orma Flaming  Sonographer Comments: Very technically difficult study. Patient is morbidly obese, scanned upright in recliner due to dyspnea. IMPRESSIONS  1. Left ventricular ejection fraction, by estimation, is <20%. The left ventricle has severely decreased function. Left ventricular endocardial border not optimally defined to evaluate regional wall motion. The left ventricular internal cavity size was severely dilated. Left ventricular diastolic parameters are indeterminate.  2. Right ventricular systolic function was not well visualized. The right ventricular size is normal. Tricuspid regurgitation signal is inadequate for assessing PA pressure.  3. Left atrial size was moderately dilated.  4. The mitral valve was not well visualized. Trivial mitral valve regurgitation.  5. The aortic valve is tricuspid. Aortic valve regurgitation is not visualized. Comparison(s): A prior study was performed on 06/17/2020. LVEF is severely reduced; difficult comparison (this is a very technically difficult study). FINDINGS  Left Ventricle: Left ventricular ejection fraction, by estimation, is <20%. The left ventricle has severely decreased function. Left ventricular endocardial border not optimally defined to evaluate regional wall motion. Definity contrast agent was given  IV to delineate the left ventricular endocardial borders. The left ventricular internal cavity size was severely dilated. There is no left ventricular hypertrophy. Left ventricular diastolic parameters are indeterminate. Right Ventricle: The right ventricular size is normal. Right vetricular wall thickness was not well visualized. Right ventricular systolic function was not well visualized. Tricuspid  regurgitation signal is inadequate for assessing PA pressure. Left Atrium: Left atrial size was moderately dilated. Right Atrium: Right atrial size was normal in size. Pericardium: There is no evidence of pericardial effusion. Mitral Valve: The mitral valve was not well visualized. Trivial mitral valve regurgitation. Tricuspid Valve: The tricuspid valve is not well visualized. Tricuspid valve regurgitation is not demonstrated. No evidence of tricuspid stenosis. Aortic Valve: The aortic  valve is tricuspid. Aortic valve regurgitation is not visualized. Pulmonic Valve: The pulmonic valve was not well visualized. Pulmonic valve regurgitation is not visualized. Aorta: The aortic root and ascending aorta are structurally normal, with no evidence of dilitation. IAS/Shunts: The interatrial septum was not well visualized.  LEFT VENTRICLE PLAX 2D LVIDd:         7.20 cm LVIDs:         6.70 cm LV PW:         0.90 cm LV IVS:        0.70 cm LVOT diam:     2.50 cm LV SV:         32 LV SV Index:   11 LVOT Area:     4.91 cm  RIGHT VENTRICLE RV S prime:     6.09 cm/s TAPSE (M-mode): 0.7 cm LEFT ATRIUM            Index       RIGHT ATRIUM           Index LA diam:      4.40 cm  1.44 cm/m  RA Area:     20.70 cm LA Vol (A4C): 131.0 ml 43.01 ml/m RA Volume:   55.60 ml  18.26 ml/m  AORTIC VALVE LVOT Vmax:   54.30 cm/s LVOT Vmean:  37.700 cm/s LVOT VTI:    0.066 m  AORTA Ao Root diam: 3.00 cm Ao Asc diam:  3.00 cm  SHUNTS Systemic VTI:  0.07 m Systemic Diam: 2.50 cm Rudean Haskell MD Electronically signed by Rudean Haskell MD Signature Date/Time: 02/12/2021/3:50:48 PM    Final    [place] EKG SITE RITE  Result Date: 02/13/2021 If Site Rite image not attached, placement could not be confirmed due to current cardiac rhythm.  [place] EKG SITE RITE  Result Date: 02/12/2021 If Site Rite image not attached, placement could not be confirmed due to current cardiac rhythm.    Medications:     Scheduled Medications:  apixaban  5 mg  Oral BID   potassium chloride SA  40 mEq Oral BID   sodium chloride flush  3 mL Intravenous Q12H    Infusions:  sodium chloride     amiodarone 60 mg/hr (02/13/21 0644)   cefTRIAXone (ROCEPHIN)  IV     DOBUTamine 2.5 mcg/kg/min (02/12/21 2344)   furosemide (LASIX) 200 mg in dextrose 5% 100 mL ('2mg'$ /mL) infusion 20 mg/hr (02/13/21 0432)   magnesium sulfate bolus IVPB      PRN Medications: sodium chloride, acetaminophen **OR** acetaminophen, sodium chloride flush  Assessment/Plan   1. Acute on Chronic Biventricular Heart Failure:  Known cardiomyopathy since 2019, previously [facility] by [facility].  Echo 02/27/2020 with EF < 20%, moderate LV dilation, severely decreased RV function with severe RV dilation, severe biatrial enlargement, mild MR. No history of ETOH/drugs.  No FH of cardiomyopathy (mother with Ross).  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  He is too large for cardiac MRI. RHC/LHC on 03/04/20 with no CAD (nonischemic cardiomyopathy), elevated left and right heart filling pressures, and low CI at 2.1. 05/2020 Echo showed EF 20-25%. Evaluated by EP but not a candidate for ICD given BMI.  Echo this admission with EF < 20%, RV poorly visualized. He now has CHF exacerbation likely in setting of AFL with RVR.  He was hypotensive overnight with poor UOP, concern for cardiogenic shock.  Dobutamine therefore started at 2.5.  Unable to get PICC yesterday and refused foley catheter.  Now with some UOP this morning (just had 225 cc).  SBP 100s, HR 110s.  Comfortable on CPAP but hypoxemic without.  Volume overload on exam, weight is up 40 lbs.  AKI with creatinine up to 2.7.  - Place PICC, he should be able to lie back in bed on CPAP.  Will follow CVP and co-ox.  - Increase dobutamine empircially to 5 mcg/kg/min.  - Now getting some urine output, on Lasix gtt 20 which I will continue.  Needs foley catheter, ?component of obstruction. He is now agreeing  to foley.  - Fluid restrict <1,800 ml/ day - With AKI/hypotension, stop Entresto, dapagliflozin, spironolactone, digoxin.  - Need attempt at DCCV to restore NSR, once better diuresed - with multiple re-admits in the last 12 months for CHF, consider CardioMEMs - With marked obesity, he is not a candidate for advanced therapies.  2. Aflutter w/ RVR: Noted to be in Afib prior admit, 3/22, in setting of CAP and a/c CHF but converted to NSR on amiodarone. Discharged home w/ eliquis. In 2:1 AFL this admit in setting of a/c CHF w/ marked volume overload, + recent gastroenteritis w/ diarrhea. Has OSA but not using CPAP. TSH WNL. Admits to intermittent compliance w/ Eliquis. Has missed several doses in the last month.  Now on amiodarone gtt 60 mg/hr with HR 110s.  - Continue amio gtt 60 mg/hr.  - Continue Eliquis 5 mg bid.  - Plan TEE/DCCV this admit, will need more diuresis first (not today).  3. Acute Hypoxic Respiratory Failure: suspect underlying OHS/OSA exacerbated by CHF this admission.  - Continue oxygen + CPAP.  - Diurese.  4. Type 2 DM: Farxiga held with AKI.  5. OSA: Has not been on Bipap at home.  6. Super Morbid Obesity: Body mass index is 66.26 kg/m. - wt loss advised 7. Tooth Abscess: Has been on amoxicillin but changing to ceftriaxone with ?UTI.  8. UTI: Suspect by UA, sent urine culture.  ?Urinary obstruction related to this.  - Ceftriaxone IV - Place foley for ?obstruction as above.  9. Elevated Hs Trop: Suspect demand ischemia in setting of CHF + rapid AFL. Cath 8/21 showed no CAD.   Transfer to [place].   CRITICAL CARE Performed by: [name]  Total critical care time: 40 minutes  Critical care time was exclusive of separately billable procedures and treating other patients.  Critical care was necessary to treat or prevent imminent or life-threatening deterioration.  Critical care was time spent personally by me on the following activities: development of treatment plan  with patient and/or surrogate as well as nursing, discussions with consultants, evaluation of patient's response to treatment, examination of patient, obtaining history from patient or surrogate, ordering and performing treatments and interventions, ordering and review of laboratory studies, ordering and review of radiographic studies, pulse oximetry and re-evaluation of patient's condition.    Length of Stay: 2  [name], MD  02/13/2021, 8:25 AM  Advanced Heart Failure Team Pager [phone] (M-F; 7a - 5p)  Please contact [facility] for night-coverage after hours (5p -7a ) and weekends on [email or web address]

## 2021-02-14 DIAGNOSIS — I5023 Acute on chronic systolic (congestive) heart failure: Secondary | ICD-10-CM | POA: Diagnosis not present

## 2021-02-14 DIAGNOSIS — R570 Cardiogenic shock: Secondary | ICD-10-CM | POA: Diagnosis not present

## 2021-02-14 LAB — CBC
HCT: 34.4 % — ABNORMAL LOW (ref 39.0–52.0)
Hemoglobin: 11 g/dL — ABNORMAL LOW (ref 13.0–17.0)
MCH: 30 pg (ref 26.0–34.0)
MCHC: 32 g/dL (ref 30.0–36.0)
MCV: 93.7 fL (ref 80.0–100.0)
Platelets: 271 10*3/uL (ref 150–400)
RBC: 3.67 MIL/uL — ABNORMAL LOW (ref 4.22–5.81)
RDW: 14.8 % (ref 11.5–15.5)
WBC: 9.9 10*3/uL (ref 4.0–10.5)
nRBC: 0 % (ref 0.0–0.2)

## 2021-02-14 LAB — BASIC METABOLIC PANEL
Anion gap: 12 (ref 5–15)
BUN: 24 mg/dL — ABNORMAL HIGH (ref 6–20)
CO2: 26 mmol/L (ref 22–32)
Calcium: 8.3 mg/dL — ABNORMAL LOW (ref 8.9–10.3)
Chloride: 90 mmol/L — ABNORMAL LOW (ref 98–111)
Creatinine, Ser: 3.24 mg/dL — ABNORMAL HIGH (ref 0.61–1.24)
GFR, Estimated: 25 mL/min — ABNORMAL LOW (ref >60)
Glucose, Bld: 212 mg/dL — ABNORMAL HIGH (ref 70–99)
Potassium: 4.1 mmol/L (ref 3.5–5.1)
Sodium: 128 mmol/L — ABNORMAL LOW (ref 135–145)

## 2021-02-14 LAB — COOXEMETRY PANEL
Carboxyhemoglobin: 1 % (ref 0.5–1.5)
Methemoglobin: 1 % (ref 0.0–1.5)
O2 Saturation: 86.4 %
Total hemoglobin: 11.1 g/dL — ABNORMAL LOW (ref 12.0–16.0)

## 2021-02-14 MED ORDER — DIPHENHYDRAMINE HCL 25 MG PO CAPS
25.0000 mg | ORAL_CAPSULE | Freq: Once | ORAL | Status: AC
  Administered 2021-02-14: 25 mg via ORAL
  Filled 2021-02-14: qty 1

## 2021-02-14 MED ORDER — NOREPINEPHRINE 4 MG/250ML-% IV SOLN
6.0000 ug/min | INTRAVENOUS | Status: DC
  Administered 2021-02-14 (×2): 3 ug/min via INTRAVENOUS
  Administered 2021-02-15: 5 ug/min via INTRAVENOUS
  Administered 2021-02-16: 3 ug/min via INTRAVENOUS
  Administered 2021-02-16: 6 ug/min via INTRAVENOUS
  Administered 2021-02-17 – 2021-02-24 (×7): 3 ug/min via INTRAVENOUS
  Filled 2021-02-14 (×15): qty 250

## 2021-02-14 MED ORDER — ORAL CARE MOUTH RINSE
15.0000 mL | Freq: Two times a day (BID) | OROMUCOSAL | Status: DC
  Administered 2021-02-14 – 2021-02-28 (×14): 15 mL via OROMUCOSAL

## 2021-02-14 MED ORDER — CHLORHEXIDINE GLUCONATE 0.12 % MT SOLN
15.0000 mL | Freq: Two times a day (BID) | OROMUCOSAL | Status: DC
  Administered 2021-02-14 – 2021-03-02 (×26): 15 mL via OROMUCOSAL
  Filled 2021-02-14 (×23): qty 15

## 2021-02-14 NOTE — Progress Notes (Signed)
[facility] Progress Note Patient Name: Phillip Young DOB: [birth year] MRN: [id number]   Date of Service  02/14/2021  HPI/Events of Note  Pruritus. RN requests benadryl.   eICU Interventions  Ordered benadryl '25mg'$  PO x1 dose.     Intervention Category Minor Interventions: Routine modifications to care plan (e.g. PRN medications for pain, fever)  Charlott Rakes 02/14/2021, 10:32 PM

## 2021-02-14 NOTE — Progress Notes (Signed)
PROGRESS NOTE    Phillip Young  TRV:[id number] DOB: 06-23-1986 DOA: 02/11/2021 PCP: Janith Lima, MD    Chief Complaint  Patient presents with   Shortness of Breath    Brief Narrative:  Phillip Young is a 35 y.o. male with medical history significant of biventricular heart failure, morbid obesity, HTN, GERD, OSA, prediabetes who presented with shortness of breath and palpitations x 1 week, He checked his pulse ox at home and his oxygen was 68% and heart rate was 150 ED Course: afebrile, bp: 112/91, HR: 131, RR: 26, oxygen: 88-91% room air-->2L 94% He is admitted for acute heart failure acute hypoxic respiratory failure, a flutter RVR Heart failure team consulted  Subjective:  He was started on  levophed this am due to hypotension while on dobutamine drip, he is continued on  amiodarone drip and lasix drip 1.3 L urine output documented last 24 hours, creatinine went up He is sitting up on edge of bed, denies chest pain   Assessment & Plan:   Principal Problem:   Acute on chronic systolic (congestive) heart failure ([place]) Active Problems:   Morbid obesity with body mass index (BMI) of 60.0 to 69.9 in adult [facility])   Essential hypertension   GERD without esophagitis   OSA (obstructive sleep apnea)   Prediabetes   Atrial flutter with rapid ventricular response ([name])   Elevated troponin   Respiratory failure with hypoxia ([name])   Acute on chronic biventricular heart failure exacerbation/Acute hypoxic respiratory failure -LVEF less than 20% -Elevated troponin likely demand ischemia in the setting of heart failure and a flutter, has cath 8/21 showed no CAD -He did not appear to respond IV Lasix , now on Lasix drip -he is started on dobutamine drip on 7/21-7/22 night , levophed started on 7/23 am --PICC placed to monitor CVP and venous O2 sat -management per  heart failure team   A flutter RVR Amiodarone drip, was on Eliquis, now [place] for cardioversion Management  per cardiology  Elevation of LFTs Likely due to liver congestion   AKI on KD 2 Ua with rae bacteria, not sure if clean cath, urine culture ordered, but per RN ,not able to get clean cath urine, he refused foley placement rocephin per cardiology  Bladder scan /renal US  Renal dosing meds  Non-insulin-dependent type 2 diabetes A1c 6.5 Was on Farxiga, discontinued due to worsening cr  On amoxicillin after recent teeth extraction prior to admission  finished course  Morbid obesity : Body mass index is 67.95 kg/m.. OSA, no longer use BiPAP due to cost, appreciate case manager management, hopefully can get him home on NIV, as he likely has obesity hypoventilation syndrome as well Will likely need home O2 as well Will provide weight management clinic information and dietitian information to the patient      Unresulted Labs (From admission, onward)     Start     Ordered   02/15/21 0500  Magnesium  Tomorrow morning,   R       Question:  Specimen collection method  Answer:  Unit=Unit collect   02/14/21 1432   02/14/21 0500  .Cooxemetry Panel (carboxy, met, total hgb, O2 sat)  Daily,   R      02/13/21 0829   02/14/21 0500  CBC  Daily,   R      02/13/21 0829   02/14/21 8616  Basic metabolic panel  Daily,   R      02/13/21 1700   02/13/21 0500  Blood gas, arterial  Tomorrow morning,   R        02/12/21 1508   02/12/21 1823  Urine Culture  Once,   R       Question:  Indication  Answer:  Urgency/frequency   02/12/21 1823              DVT prophylaxis:  rivaroxaban (XARELTO) tablet 20 mg   Code Status: Full Family Communication: Patient Disposition:   Status is: Inpatient   Dispo: The patient is from: Home              Anticipated d/c is to: Home              Anticipated d/c date is: To be determined                Consultants:  Cardiology/heart failure team  Procedures:  PICC line placement  Antimicrobials:    Anti-infectives (From admission, onward)     Start     Dose/Rate Route Frequency Ordered Stop   02/13/21 0900  cefTRIAXone (ROCEPHIN) 1 g in sodium chloride 0.9 % 100 mL IVPB        1 g 200 mL/hr over 30 Minutes Intravenous Every 24 hours 02/13/21 0810     02/12/21 1000  amoxicillin (AMOXIL) capsule 500 mg  Status:  Discontinued        500 mg Oral 3 times daily 02/12/21 0038 02/13/21 0816           Objective: Vitals:   02/14/21 1500 02/14/21 1600 02/14/21 1700 02/14/21 1800  BP: (!) 107/58  91/61 (!) 92/54  Pulse: (!) 119 (!) 108 (!) 105 (!) 110  Resp: 11 (!) 40 (!) 27   Temp:      TempSrc:      SpO2: 97% 93% 95% 99%  Weight:      Height:        Intake/Output Summary (Last 24 hours) at 02/14/2021 1837 Last data filed at 02/14/2021 1700 Gross per 24 hour  Intake 2267.55 ml  Output 800 ml  Net 1467.55 ml   Filed Weights   02/12/21 0025 02/13/21 0501 02/14/21 0530  Weight: (!) 215.5 kg (!) 217.1 kg (!) 221 kg    Examination:  General exam: calm, NAD, obese Respiratory system:  difficult to auscultate, diminished, no wheezing /rales/rhonchi, Respiratory effort normal. Cardiovascular system: IRRR. +pedal edema. Gastrointestinal system: Abdomen is nondistended, soft and nontender.+ bowel sounds  Central nervous system: Alert and oriented. No focal neurological deficits. Extremities: Symmetric 5 x 5 power. Skin: No rashes, lesions or ulcers Psychiatry: Judgement and insight appear normal. Mood & affect appropriate.     Data Reviewed: I have personally reviewed following labs and imaging studies  CBC: Recent Labs  Lab 02/11/21 1208 02/14/21 0440  WBC 10.3 9.9  NEUTROABS 8.1*  --   HGB 13.5 11.0*  HCT 42.9 34.4*  MCV 93.1 93.7  PLT 343 831    Basic Metabolic Panel: Recent Labs  Lab 02/11/21 1208 02/11/21 2100 02/12/21 0413 02/13/21 0146 02/13/21 1600 02/14/21 0440  NA 135  --  132* 132* 130* 128*  K 3.6  --  3.6 4.1 3.5 4.1  CL 99  --  97* 97* 92* 90*  CO2 25  --  $R'23 24 27 26  'Tr$ GLUCOSE 127*   --  144* 106* 257* 212*  BUN 10  --  13 22* 23* 24*  CREATININE 1.14  --  1.24 2.73* 2.42* 3.24*  CALCIUM 8.6*  --  8.8* 8.5* 8.0* 8.3*  MG  --  1.8  --  1.8  --   --     GFR: Estimated Creatinine Clearance: 60.1 mL/min (A) (by C-G formula based on SCr of 3.24 mg/dL (H)).  Liver Function Tests: Recent Labs  Lab 02/11/21 1208 02/12/21 0413  AST 44* 35  ALT 52* 47*  ALKPHOS 68 69  BILITOT 2.1* 1.9*  PROT 7.7 7.9  ALBUMIN 3.4* 3.4*    CBG: No results for input(s): GLUCAP in the last 168 hours.   Recent Results (from the past 240 hour(s))  Resp Panel by RT-PCR (Flu A&B, Covid) Nasopharyngeal Swab     Status: None   Collection Time: 02/11/21  2:45 PM   Specimen: Nasopharyngeal Swab; Nasopharyngeal(NP) swabs in vial transport medium  Result Value Ref Range Status   SARS Coronavirus 2 by RT PCR NEGATIVE NEGATIVE Final    Comment: (NOTE) SARS-CoV-2 target nucleic acids are NOT DETECTED.  The SARS-CoV-2 RNA is generally detectable in upper respiratory specimens during the acute phase of infection. The lowest concentration of SARS-CoV-2 viral copies this assay can detect is 138 copies/mL. A negative result does not preclude SARS-Cov-2 infection and should not be used as the sole basis for treatment or other patient management decisions. A negative result may occur with  improper specimen collection/handling, submission of specimen other than nasopharyngeal swab, presence of viral mutation(s) within the areas targeted by this assay, and inadequate number of viral copies(<138 copies/mL). A negative result must be combined with clinical observations, patient history, and epidemiological information. The expected result is Negative.  Fact Sheet for Patients:  EntrepreneurPulse.com.au  Fact Sheet for Healthcare Providers:  IncredibleEmployment.be  This test is no t yet approved or cleared by the [place] FDA and  has been authorized for  detection and/or diagnosis of SARS-CoV-2 by FDA under an Emergency Use Authorization (EUA). This EUA will remain  in effect (meaning this test can be used) for the duration of the COVID-19 declaration under Section 564(b)(1) of the Act, 21 U.S.C.section 360bbb-3(b)(1), unless the authorization is terminated  or revoked sooner.       Influenza A by PCR NEGATIVE NEGATIVE Final   Influenza B by PCR NEGATIVE NEGATIVE Final    Comment: (NOTE) The Xpert Xpress SARS-CoV-2/FLU/RSV plus assay is intended as an aid in the diagnosis of influenza from Nasopharyngeal swab specimens and should not be used as a sole basis for treatment. Nasal washings and aspirates are unacceptable for Xpert Xpress SARS-CoV-2/FLU/RSV testing.  Fact Sheet for Patients: EntrepreneurPulse.com.au  Fact Sheet for Healthcare Providers: IncredibleEmployment.be  This test is not yet approved or cleared by the [place] FDA and has been authorized for detection and/or diagnosis of SARS-CoV-2 by FDA under an Emergency Use Authorization (EUA). This EUA will remain in effect (meaning this test can be used) for the duration of the COVID-19 declaration under Section 564(b)(1) of the Act, 21 U.S.C. section 360bbb-3(b)(1), unless the authorization is terminated or revoked.  Performed at [facility], [place] [address], [place], [place] [address]          Radiology Studies: US RENAL  Result Date: 02/13/2021 CLINICAL DATA:  Elevated creatinine, morbid obesity EXAM: RENAL / URINARY TRACT ULTRASOUND COMPLETE COMPARISON:  None. FINDINGS: Right Kidney: Renal measurements: 9.6 x 6.1 x 5.4 cm = volume: 165 mL. Echogenicity within normal limits. No mass or hydronephrosis visualized. Left Kidney: Renal measurements: 11.5 x 6.5 x 5.5 cm = volume: 217 mL. Echogenicity within normal limits.  No mass or hydronephrosis visualized. Bladder: Appears normal for degree of bladder distention. Other:  Limited exam because of body habitus. IMPRESSION: Limited exam but no acute finding by ultrasound.  No hydronephrosis. Electronically Signed   By: Jerilynn Mages.  Shick M.D.   On: 02/13/2021 17:03   DG Chest Port 1 View  Result Date: 02/13/2021 CLINICAL DATA:  Shortness of breath.  PICC line placement. EXAM: PORTABLE CHEST 1 VIEW COMPARISON:  02/11/2021 FINDINGS: 1035 hours. Low volume lordotic film with underpenetration and leftward rotation. The cardio pericardial silhouette is enlarged. Vascular congestion with probable interstitial pulmonary edema although technique on the study hinders assessment. No substantial pleural effusion left costophrenic angle has not been included on the study. Right PICC line tip projects over the mediastinum, just below the carina. IMPRESSION: Low volume lordotic film with under penetration and leftward rotation. The right PICC line tip projects inferior to the carina over the T6 vertebral body. Given technical limitations and patient rotation, clinical correlation for tip position will be required. If the PICC line is in the SVC, the position of the PICC line tip would be in the mid SVC region. Electronically Signed   By: Misty Stanley M.D.   On: 02/13/2021 11:01   [place] EKG SITE RITE  Result Date: 02/13/2021 If [facility] image not attached, placement could not be confirmed due to current cardiac rhythm.       Scheduled Meds:  chlorhexidine  15 mL Mouth Rinse BID   Chlorhexidine Gluconate Cloth  6 each Topical Daily   mouth rinse  15 mL Mouth Rinse q12n4p   rivaroxaban  20 mg Oral Q supper   sodium chloride flush  10-40 mL Intracatheter Q12H   sodium chloride flush  3 mL Intravenous Q12H   Continuous Infusions:  sodium chloride     sodium chloride 20 mL/hr at 02/14/21 1200   amiodarone 60 mg/hr (02/14/21 1811)   cefTRIAXone (ROCEPHIN)  IV Stopped (02/14/21 0837)   DOBUTamine 5 mcg/kg/min (02/14/21 1200)   furosemide (LASIX) 200 mg in dextrose 5% 100 mL ($Remov'2mg'ZDJrLG$ /mL)  infusion 20 mg/hr (02/14/21 1200)   norepinephrine (LEVOPHED) Adult infusion 5 mcg/min (02/14/21 1335)     LOS: 3 days   Time spent: 62mins Greater than 50% of this time was spent in counseling, explanation of diagnosis, planning of further management, and coordination of care.   Voice Recognition Viviann Spare dictation system was used to create this note, attempts have been made to correct errors. Please contact the author with questions and/or clarifications.   Florencia Reasons, MD PhD FACP [facility]  Available via Epic secure chat 7am-7pm for nonurgent issues Please page for urgent issues To page the attending provider between 7A-7P or the covering provider during after hours 7P-7A, please log into the web site [email or web address] and access using universal [place] password for that web site. If you do not have the password, please call the hospital operator.    02/14/2021, 6:37 PM

## 2021-02-14 NOTE — Plan of Care (Signed)

## 2021-02-14 NOTE — Progress Notes (Signed)
Pt alert and aware sitting on the edge of bed. He had just been visited by the doctor, for which he had a lot of questions for concerning his care. He told me some of his medical history.  The chaplain offered caring and supportive presence, prayers and blessings and listening ear. I informed him that he could request another visit by letting the nurse know.

## 2021-02-14 NOTE — Progress Notes (Signed)
Patient ID: Phillip Young, male   DOB: 16-Feb-1986, 35 y.o.   MRN: [id number]     Advanced Heart Failure Rounding Note  PCP-Cardiologist: None   Subjective:    Patient remains in atrial flutter rate around 110 on amiodarone 60 mg/hr.    Dobutamine 5 ongoing with Lasix gtt 20.  CVP 20 with co-ox 86%. Creatinine 1.24 => 2.73 => 3.24.  UOP not vigorous, 1350 cc.  Breathing comfortably on CPAP.    UA concerning for UTI. On ceftriaxone.   Echo: EF < 20%, severe LV dilation, RV not well visualized.   Objective:   Weight Range: (!) 221 kg Body mass index is 67.95 kg/m.   Vital Signs:   Temp:  [97.4 F (36.3 C)-98.1 F (36.7 C)] 97.5 F (36.4 C) (07/23 0400) Pulse Rate:  [92-120] 111 (07/23 0555) Resp:  [14-35] 26 (07/23 0555) BP: (56-156)/(25-125) 91/56 (07/23 0555) SpO2:  [90 %-100 %] 100 % (07/23 0555) Weight:  [221 kg] 221 kg (07/23 0530) Last BM Date: 02/12/21  Weight change: Filed Weights   02/12/21 0025 02/13/21 0501 02/14/21 0530  Weight: (!) 215.5 kg (!) 217.1 kg (!) 221 kg    Intake/Output:   Intake/Output Summary (Last 24 hours) at 02/14/2021 0713 Last data filed at 02/14/2021 0600 Gross per 24 hour  Intake 3056.27 ml  Output 1350 ml  Net 1706.27 ml      Physical Exam    General: NAD, obese.  Neck: JVP 16+, no thyromegaly or thyroid nodule.  Lungs: Clear to auscultation bilaterally with normal respiratory effort. CV: Nonpalpable PMI.  Heart tachy, regular S1/S2, no S3/S4, no murmur.  1+ edema to knees.   Abdomen: Soft, nontender, no hepatosplenomegaly, no distention.  Skin: Intact without lesions or rashes.  Neurologic: Alert and oriented x 3.  Psych: Normal affect. Extremities: No clubbing or cyanosis.  HEENT: Normal.    Telemetry   Atrial flutter rate around 110 (personally reviewed)  Labs    CBC Recent Labs    02/11/21 1208 02/14/21 0440  WBC 10.3 9.9  NEUTROABS 8.1*  --   HGB 13.5 11.0*  HCT 42.9 34.4*  MCV 93.1 93.7  PLT 343 [id number]    Basic Metabolic Panel Recent Labs    02/11/21 2100 02/12/21 0413 02/13/21 0146 02/13/21 1600 02/14/21 0440  NA  --    < > 132* 130* 128*  K  --    < > 4.1 3.5 4.1  CL  --    < > 97* 92* 90*  CO2  --    < > '24 27 26  '$ GLUCOSE  --    < > 106* 257* 212*  BUN  --    < > 22* 23* 24*  CREATININE  --    < > 2.73* 2.42* 3.24*  CALCIUM  --    < > 8.5* 8.0* 8.3*  MG 1.8  --  1.8  --   --    < > = values in this interval not displayed.   Liver Function Tests Recent Labs    02/11/21 1208 02/12/21 0413  AST 44* 35  ALT 52* 47*  ALKPHOS 68 69  BILITOT 2.1* 1.9*  PROT 7.7 7.9  ALBUMIN 3.4* 3.4*   No results for input(s): LIPASE, AMYLASE in the last 72 hours. Cardiac Enzymes No results for input(s): CKTOTAL, CKMB, CKMBINDEX, TROPONINI in the last 72 hours.  BNP: BNP (last 3 results) Recent Labs    10/10/20 1336 11/07/20 0932 02/11/21 1500  BNP  126.9* 106.0* 194.3*    ProBNP (last 3 results) No results for input(s): PROBNP in the last 8760 hours.   D-Dimer Recent Labs    02/11/21 1500  DDIMER 0.59*   Hemoglobin A1C Recent Labs    02/11/21 2100  HGBA1C 6.5*   Fasting Lipid Panel No results for input(s): CHOL, HDL, LDLCALC, TRIG, CHOLHDL, LDLDIRECT in the last 72 hours. Thyroid Function Tests Recent Labs    02/11/21 2100  TSH 3.693    Other results:   Imaging    US RENAL  Result Date: 02/13/2021 CLINICAL DATA:  Elevated creatinine, morbid obesity EXAM: RENAL / URINARY TRACT ULTRASOUND COMPLETE COMPARISON:  None. FINDINGS: Right Kidney: Renal measurements: 9.6 x 6.1 x 5.4 cm = volume: 165 mL. Echogenicity within normal limits. No mass or hydronephrosis visualized. Left Kidney: Renal measurements: 11.5 x 6.5 x 5.5 cm = volume: 217 mL. Echogenicity within normal limits. No mass or hydronephrosis visualized. Bladder: Appears normal for degree of bladder distention. Other: Limited exam because of body habitus. IMPRESSION: Limited exam but no acute finding by  ultrasound.  No hydronephrosis. Electronically Signed   By: Jerilynn Mages.  Shick M.D.   On: 02/13/2021 17:03   DG Chest Port 1 View  Result Date: 02/13/2021 CLINICAL DATA:  Shortness of breath.  PICC line placement. EXAM: PORTABLE CHEST 1 VIEW COMPARISON:  02/11/2021 FINDINGS: 1035 hours. Low volume lordotic film with underpenetration and leftward rotation. The cardio pericardial silhouette is enlarged. Vascular congestion with probable interstitial pulmonary edema although technique on the study hinders assessment. No substantial pleural effusion left costophrenic angle has not been included on the study. Right PICC line tip projects over the mediastinum, just below the carina. IMPRESSION: Low volume lordotic film with under penetration and leftward rotation. The right PICC line tip projects inferior to the carina over the T6 vertebral body. Given technical limitations and patient rotation, clinical correlation for tip position will be required. If the PICC line is in the SVC, the position of the PICC line tip would be in the mid SVC region. Electronically Signed   By: Misty Stanley M.D.   On: 02/13/2021 11:01   [place] EKG SITE RITE  Result Date: 02/13/2021 If [facility] image not attached, placement could not be confirmed due to current cardiac rhythm.    Medications:     Scheduled Medications:  chlorhexidine  15 mL Mouth Rinse BID   Chlorhexidine Gluconate Cloth  6 each Topical Daily   mouth rinse  15 mL Mouth Rinse q12n4p   rivaroxaban  20 mg Oral Q supper   sodium chloride flush  10-40 mL Intracatheter Q12H   sodium chloride flush  3 mL Intravenous Q12H    Infusions:  sodium chloride     sodium chloride 20 mL/hr at 02/13/21 2148   amiodarone 60 mg/hr (02/14/21 0600)   cefTRIAXone (ROCEPHIN)  IV 1 g (02/13/21 1709)   DOBUTamine 5 mcg/kg/min (02/14/21 0600)   furosemide (LASIX) 200 mg in dextrose 5% 100 mL ('2mg'$ /mL) infusion 20 mg/hr (02/14/21 0600)    PRN Medications: sodium chloride,  acetaminophen **OR** acetaminophen, sodium chloride flush, sodium chloride flush  Assessment/Plan   1. Acute on Chronic Biventricular Heart Failure:  Known cardiomyopathy since 2019, previously followed by [facility].  Echo 02/27/2020 with EF < 20%, moderate LV dilation, severely decreased RV function with severe RV dilation, severe biatrial enlargement, mild MR. No history of ETOH/drugs.  No FH of cardiomyopathy (mother with [place]).  He has biventricular failure, nonischemic dilated cardiomyopathy, ?related to  prior myocarditis.  He is too large for cardiac MRI. RHC/LHC on 03/04/20 with no CAD (nonischemic cardiomyopathy), elevated left and right heart filling pressures, and low CI at 2.1. 05/2020 Echo showed EF 20-25%. Evaluated by EP but not a candidate for ICD given BMI.  Echo this admission with EF < 20%, RV poorly visualized. He now has CHF exacerbation likely in setting of AFL with RVR.  He was hypotensive with poor UOP, concern for cardiogenic shock.  Dobutamine therefore started at 2.5 and increased to 5.  This morning, co-ox 86% but BP soft and CVP still 20 with poor UOP.  AKI with creatinine up to 3.24.  - I will add norepinephrine 3 with soft BP, poor UOP.  - Continue dobutamine 5.  - Continue Lasix gtt 20, if UOP/creatinine do not improve, he will need CVVH.  - Refused foley, bladder scan to make sure no retention.  - Fluid restrict <1,800 ml/ day - With AKI/hypotension, stopped Entresto, dapagliflozin, spironolactone, digoxin.  - Need attempt at DCCV to restore NSR, once better diuresed - With marked obesity, he is not a candidate for advanced therapies.  2. Aflutter w/ RVR: Noted to be in Afib prior admit, 3/22, in setting of CAP and a/c CHF but converted to NSR on amiodarone. Discharged home w/ eliquis. In 2:1 AFL this admit in setting of a/c CHF w/ marked volume overload, + recent gastroenteritis w/ diarrhea. Has OSA but not using CPAP. TSH WNL. Admits to intermittent compliance w/ Eliquis. Has  missed several doses in the last month.  Now on amiodarone gtt 60 mg/hr with HR 110s.  - Continue amio gtt 60 mg/hr.  - Continue Eliquis 5 mg bid.  - Plan TEE/DCCV this admit, will need more diuresis first (not today).  3. Acute Hypoxic Respiratory Failure: suspect underlying OHS/OSA exacerbated by CHF this admission.  - Continue oxygen + CPAP.  - Diurese.  4. Type 2 DM: Farxiga held with AKI.  5. OSA: Has not been on Bipap at home.  6. Super Morbid Obesity: Body mass index is 66.26 kg/m. - wt loss advised 7. Tooth Abscess: Has been on amoxicillin but changing to ceftriaxone with ?UTI.  8. UTI: Suspect by UA, sent urine culture.  ?Urinary obstruction related to this => no hydronephrosis on renal US.  - Ceftriaxone IV - Place foley for ?obstruction as above.  9. Elevated Hs Trop: Suspect demand ischemia in setting of CHF + rapid AFL. Cath 8/21 showed no CAD.  10. AKI: Creatinine up to 3.24 today.  Concern for cardiorenal syndrome.  Renal US with no hydronephrosis.  - With poor UOP and rising creatinine, I am concerned he will require CVVH.    CRITICAL CARE Performed by: [name]  Total critical care time: 40 minutes  Critical care time was exclusive of separately billable procedures and treating other patients.  Critical care was necessary to treat or prevent imminent or life-threatening deterioration.  Critical care was time spent personally by me on the following activities: development of treatment plan with patient and/or surrogate as well as nursing, discussions with consultants, evaluation of patient's response to treatment, examination of patient, obtaining history from patient or surrogate, ordering and performing treatments and interventions, ordering and review of laboratory studies, ordering and review of radiographic studies, pulse oximetry and re-evaluation of patient's condition.    Length of Stay: 3  [name], MD  02/14/2021, 7:13 AM  Advanced Heart  Failure Team Pager [phone] (M-F; 7a - 5p)  Please contact  [facility] for night-coverage after hours (5p -7a ) and weekends on [email or web address]

## 2021-02-15 ENCOUNTER — Inpatient Hospital Stay (HOSPITAL_COMMUNITY): Payer: BC Managed Care – PPO

## 2021-02-15 DIAGNOSIS — I5023 Acute on chronic systolic (congestive) heart failure: Secondary | ICD-10-CM

## 2021-02-15 LAB — RENAL FUNCTION PANEL
Albumin: 3.1 g/dL — ABNORMAL LOW (ref 3.5–5.0)
Anion gap: 8 (ref 5–15)
BUN: 31 mg/dL — ABNORMAL HIGH (ref 6–20)
CO2: 27 mmol/L (ref 22–32)
Calcium: 8.1 mg/dL — ABNORMAL LOW (ref 8.9–10.3)
Chloride: 91 mmol/L — ABNORMAL LOW (ref 98–111)
Creatinine, Ser: 3.45 mg/dL — ABNORMAL HIGH (ref 0.61–1.24)
GFR, Estimated: 23 mL/min — ABNORMAL LOW (ref >60)
Glucose, Bld: 126 mg/dL — ABNORMAL HIGH (ref 70–99)
Phosphorus: 5.8 mg/dL — ABNORMAL HIGH (ref 2.5–4.6)
Potassium: 4.2 mmol/L (ref 3.5–5.1)
Sodium: 126 mmol/L — ABNORMAL LOW (ref 135–145)

## 2021-02-15 LAB — BASIC METABOLIC PANEL
Anion gap: 13 (ref 5–15)
BUN: 28 mg/dL — ABNORMAL HIGH (ref 6–20)
CO2: 23 mmol/L (ref 22–32)
Calcium: 8 mg/dL — ABNORMAL LOW (ref 8.9–10.3)
Chloride: 93 mmol/L — ABNORMAL LOW (ref 98–111)
Creatinine, Ser: 3.32 mg/dL — ABNORMAL HIGH (ref 0.61–1.24)
GFR, Estimated: 24 mL/min — ABNORMAL LOW (ref >60)
Glucose, Bld: 146 mg/dL — ABNORMAL HIGH (ref 70–99)
Potassium: 3.6 mmol/L (ref 3.5–5.1)
Sodium: 129 mmol/L — ABNORMAL LOW (ref 135–145)

## 2021-02-15 LAB — CBC
HCT: 36.1 % — ABNORMAL LOW (ref 39.0–52.0)
Hemoglobin: 11.5 g/dL — ABNORMAL LOW (ref 13.0–17.0)
MCH: 29.6 pg (ref 26.0–34.0)
MCHC: 31.9 g/dL (ref 30.0–36.0)
MCV: 93 fL (ref 80.0–100.0)
Platelets: 274 10*3/uL (ref 150–400)
RBC: 3.88 MIL/uL — ABNORMAL LOW (ref 4.22–5.81)
RDW: 14.9 % (ref 11.5–15.5)
WBC: 8.1 10*3/uL (ref 4.0–10.5)
nRBC: 0 % (ref 0.0–0.2)

## 2021-02-15 LAB — COOXEMETRY PANEL
Carboxyhemoglobin: 1 % (ref 0.5–1.5)
Methemoglobin: 1 % (ref 0.0–1.5)
O2 Saturation: 73.3 %
Total hemoglobin: 11.3 g/dL — ABNORMAL LOW (ref 12.0–16.0)

## 2021-02-15 LAB — MAGNESIUM: Magnesium: 1.9 mg/dL (ref 1.7–2.4)

## 2021-02-15 IMAGING — DX DG CHEST 1V
1 series · 2 of 2 positions shown · non-contrast
Comparison: [DATE]

CLINICAL DATA: Central line placement.  History of CHF.

EXAM:
CHEST  1 VIEW

[Series 1: chest · 0.14mm/px · 2 of 2 slices shown]
[im 1/2]
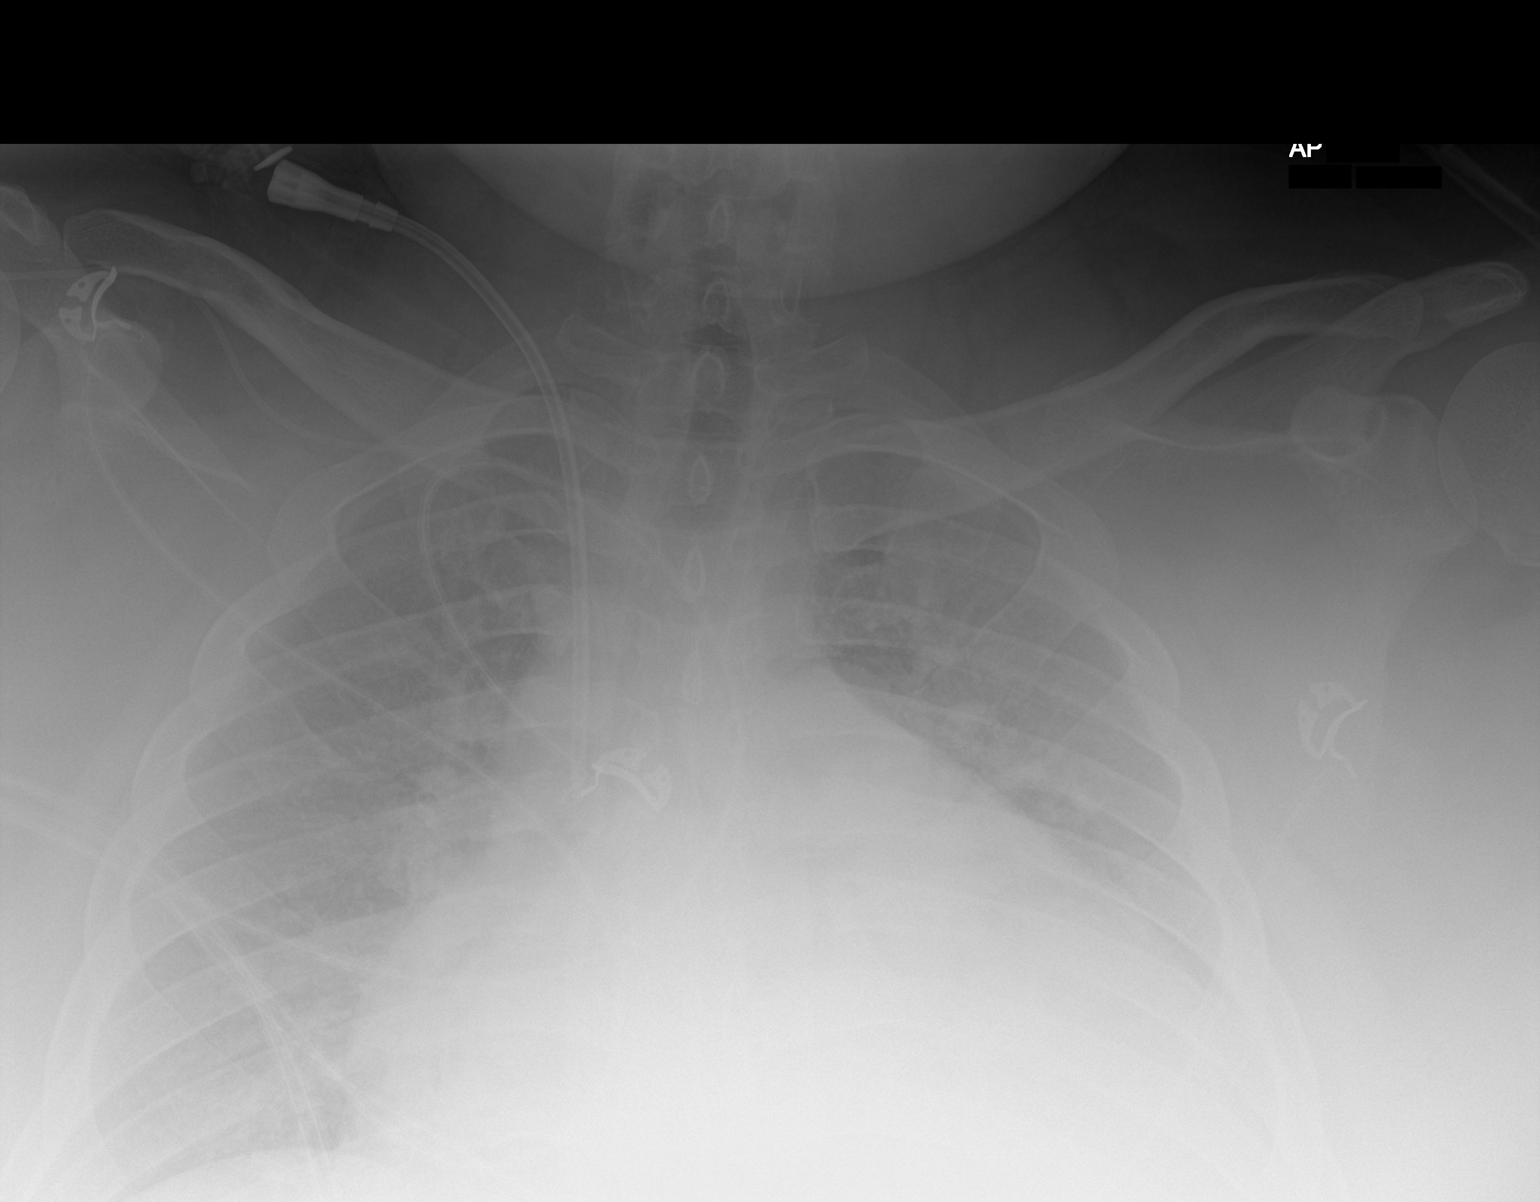
[im 2/2]
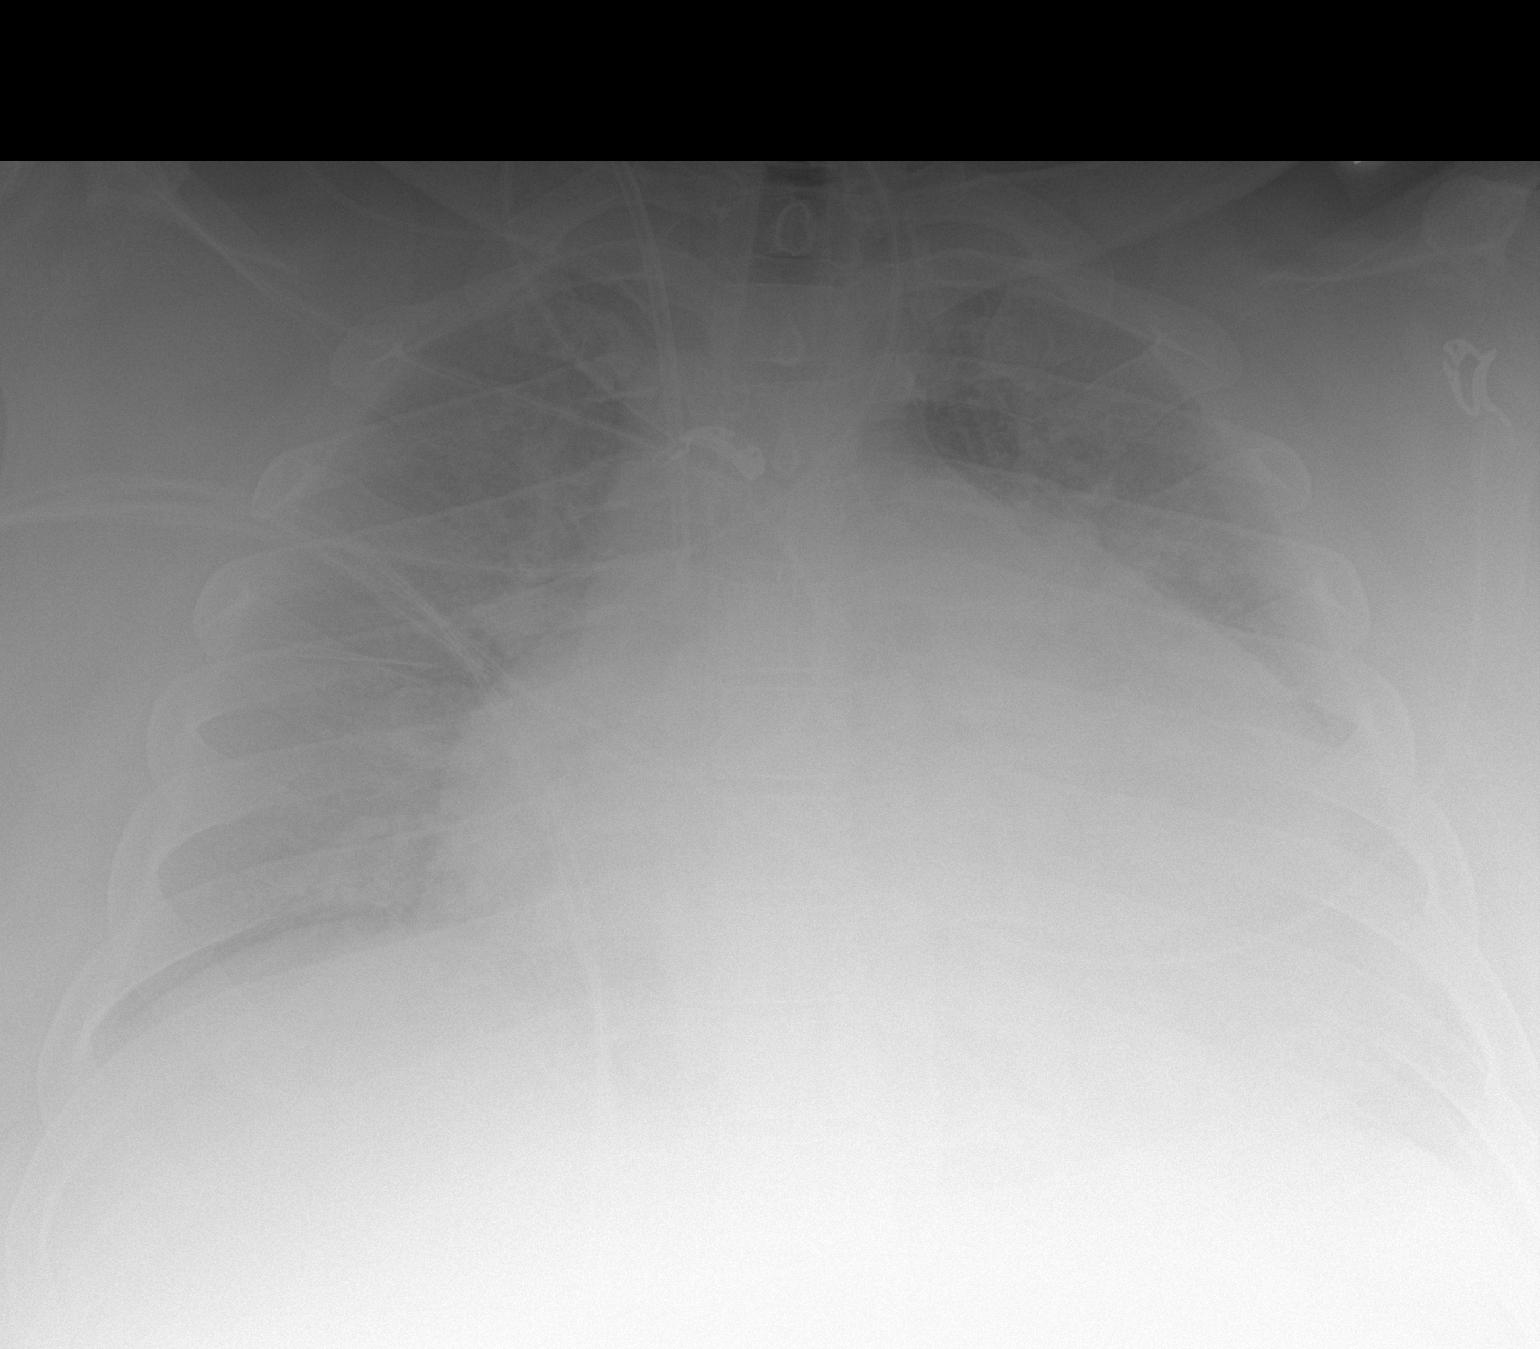

[2 of 2 positions shown; findings below may reference images not displayed]

FINDINGS: A new right central line terminates in the central SVC. No
pneumothorax. Cardiomegaly and probable edema remain on this limited
study. No other interval changes.
IMPRESSION: 1. The new right central line is in good position terminating in the
central SVC without pneumothorax.
2. Cardiomegaly and pulmonary edema.

## 2021-02-15 MED ORDER — PRISMASOL BGK 4/2.5 32-4-2.5 MEQ/L EC SOLN: Status: DC

## 2021-02-15 MED ORDER — DIPHENHYDRAMINE HCL 25 MG PO CAPS
50.0000 mg | ORAL_CAPSULE | Freq: Once | ORAL | Status: AC | PRN
  Administered 2021-02-15: 50 mg via ORAL
  Filled 2021-02-15: qty 2

## 2021-02-15 MED ORDER — MAGNESIUM SULFATE 2 GM/50ML IV SOLN
2.0000 g | Freq: Once | INTRAVENOUS | Status: AC
  Administered 2021-02-15: 2 g via INTRAVENOUS
  Filled 2021-02-15: qty 50

## 2021-02-15 MED ORDER — SODIUM CHLORIDE 0.9 % FOR CRRT: INTRAVENOUS_CENTRAL | Status: DC | PRN

## 2021-02-15 MED ORDER — POTASSIUM CHLORIDE CRYS ER 20 MEQ PO TBCR
20.0000 meq | EXTENDED_RELEASE_TABLET | Freq: Once | ORAL | Status: AC
  Administered 2021-02-15: 20 meq via ORAL
  Filled 2021-02-15: qty 1

## 2021-02-15 MED ORDER — DIPHENHYDRAMINE HCL 25 MG PO CAPS
25.0000 mg | ORAL_CAPSULE | Freq: Once | ORAL | Status: AC | PRN
  Administered 2021-02-15: 25 mg via ORAL
  Filled 2021-02-15: qty 1

## 2021-02-15 MED ORDER — METOLAZONE 5 MG PO TABS
5.0000 mg | ORAL_TABLET | Freq: Two times a day (BID) | ORAL | Status: DC
  Administered 2021-02-15: 5 mg via ORAL
  Filled 2021-02-15: qty 1

## 2021-02-15 MED ORDER — PRISMASOL BGK 4/2.5 32-4-2.5 MEQ/L REPLACEMENT SOLN: Status: DC

## 2021-02-15 MED ORDER — DEXTROSE 5 % IV SOLN
500.0000 mg | Freq: Once | INTRAVENOUS | Status: AC
  Administered 2021-02-15: 500 mg via INTRAVENOUS
  Filled 2021-02-15: qty 500

## 2021-02-15 MED ORDER — ALTEPLASE 2 MG IJ SOLR
2.0000 mg | Freq: Once | INTRAMUSCULAR | Status: AC | PRN
  Administered 2021-02-23: 2 mg
  Filled 2021-02-15: qty 2

## 2021-02-15 MED ORDER — HEPARIN SODIUM (PORCINE) 1000 UNIT/ML DIALYSIS
1000.0000 [IU] | INTRAMUSCULAR | Status: DC | PRN
  Administered 2021-02-16: 2000 [IU] via INTRAVENOUS_CENTRAL
  Administered 2021-02-20: 2400 [IU] via INTRAVENOUS_CENTRAL
  Administered 2021-02-28 – 2021-03-01 (×2): 2000 [IU] via INTRAVENOUS_CENTRAL
  Administered 2021-03-03: 2400 [IU] via INTRAVENOUS_CENTRAL
  Filled 2021-02-15 (×2): qty 6
  Filled 2021-02-15: qty 4
  Filled 2021-02-15: qty 2
  Filled 2021-02-15: qty 6
  Filled 2021-02-15: qty 1
  Filled 2021-02-15: qty 6
  Filled 2021-02-15: qty 4
  Filled 2021-02-15: qty 6
  Filled 2021-02-15: qty 2
  Filled 2021-02-15 (×2): qty 6

## 2021-02-15 NOTE — Procedures (Signed)
Central Venous Catheter Insertion Procedure Note  Phillip Young  [id number]  1986-03-28  Date:02/15/21  Time:3:13 PM   Provider Performing:Porcia Morganti Cipriano Mile   Procedure: Insertion of Non-tunneled Central Venous [phone]) with US guidance [id number])   Indication(s) Hemodialysis  Consent Risks of the procedure as well as the alternatives and risks of each were explained to the patient and/or caregiver.  Consent for the procedure was obtained and is signed in the bedside chart  Anesthesia Topical only with 1% lidocaine   Timeout Verified patient identification, verified procedure, site/side was marked, verified correct patient position, special equipment/implants available, medications/allergies/relevant history reviewed, required imaging and test results available.  Sterile Technique Maximal sterile technique including full sterile barrier drape, hand hygiene, sterile gown, sterile gloves, mask, hair covering, sterile ultrasound probe cover (if used).  Procedure Description Area of catheter insertion was cleaned with chlorhexidine and draped in sterile fashion.  With real-time ultrasound guidance a HD catheter was placed into the right internal jugular vein. Nonpulsatile blood flow and easy flushing noted in all ports.  The catheter was sutured in place and sterile dressing applied.  Complications/Tolerance None; patient tolerated the procedure well. Chest X-ray is ordered to verify placement for internal jugular or subclavian cannulation.   Chest x-ray is not ordered for femoral cannulation.  EBL Minimal  Specimen(s) None

## 2021-02-15 NOTE — Consult Note (Signed)
NAME:  Phillip Young, MRN:  [id number], DOB:  Aug 08, 1985, LOS: 4 ADMISSION DATE:  02/11/2021, CONSULTATION DATE:  02/15/21 REFERRING MD:  Harvin Hazel HF  -- [name] , CHIEF COMPLAINT:  decompensated heart failure, renal failure    History of Present Illness:  35 yo M PMH class V obesity, biventricular heart failure, HTN, OSA presented to ED 7/20 with CC SOB, palpitations x 1 week. Associated dry cough, intermittent hypoxia, edema. Admitted to [facility] for management of acute on chronic heart failure, rapid aflutter. Got '80mg'$  IV lasix, amio. 7/21 Adv HF consulted for further assistance. 7/24 patient in the CVICU on a lasix infusion as well as dobutamine without robust response to diuresis (notably patient is refusing foley catheter placement). He was started on norepi 7/24 with persistent soft Bps amd worsening AKI, volume overload.   Critical care consulted in this setting  Pertinent  Medical History  Biventricular heart failure OSA Class V obesity GERD HTN DM2  Significant Hospital Events: Including procedures, antibiotic start and stop dates in addition to other pertinent events   7/20 admitted to [facility] for Afl RVR, acute on chronic BiV HF, hypoxia. Amio, push dose lasix  7/21 adv HF consult, started dobutamine, lasix gtt  7/22 ECHO with LVEF <20% (worse than prior). Hard to assess RV with technically difficult study  7/24 adding NE. CCM consult. Worse renal function, on lasix gtt but teetering need for CRRT   Interim History / Subjective:  Started on levophed  Critical care consulted   Objective   Blood pressure (!) 129/94, pulse (!) 108, temperature 97.7 F (36.5 C), temperature source Axillary, resp. rate (!) 23, height '5\' 11"'$  (1.803 m), weight (!) 222.5 kg, SpO2 94 %. CVP:  [20 mmHg-32 mmHg] 28 mmHg      Intake/Output Summary (Last 24 hours) at 02/15/2021 1154 Last data filed at 02/15/2021 0912 Gross per 24 hour  Intake 2629.4 ml  Output 1000 ml  Net 1629.4 ml   Filed Weights   02/13/21  0501 02/14/21 0530 02/15/21 0505  Weight: (!) 217.1 kg (!) 221 kg (!) 222.5 kg    Examination: General: super morbidly obese adult M supine on CPAP sleeping   HENT: redundant neck tissue. CPAP in place. Anicteric sclera  Lungs: diminished, shallow unlabored. Symmetrical chest expansion  Cardiovascular: aflutter rvr. Distant heart sounds  Abdomen: obese soft ndnt  Extremities: no acute joint deformity  Neuro: awakens to voice, following commands no focal deficit  GU: no foley   Resolved Hospital Problem list     Assessment & Plan:   Acute hypoxic respiratory failure  OSA -pulm edema, likely some hypoventilation  -doesn't use OP CPAP due to cost -suspect component of OHS,at some point would probably be good to look at ABG and try to establish if pt chronically hypercarbic  P -volume off with lasix gtt  -CPAP PRN and QHS  Acute on chronic biventricular heart failure Cardiogenic shock  -LVEF <20% this admission P -dobutamine, levo -trending CVP, coox   Aflutter RVR P -amio -xarelto  -eventual DCCV   AKI  -not a robust UOP, hard to get a strict measurement with pt refusing foley and bladder scan has potential to be skewed with morbid obesity + edema  -renal US without acute findings  P -continues on lasix gtt, metolazone added  -neprho consulted -- will await their eval re possible CRRT initiation   Hyponatremia  -volume overloaded   DM2 -SSI   Best Practice (right click and "Reselect all SmartList Selections"  daily)   Diet/type: Regular consistency (see orders) DVT prophylaxis: DOAC GI prophylaxis: N/A Lines: yes and it is still needed PICC Foley:  N/A Code Status:  full code Last date of multidisciplinary goals of care discussion [pending] Dispo: CVICU   Labs   CBC: Recent Labs  Lab 02/11/21 1208 02/14/21 0440 02/15/21 0530  WBC 10.3 9.9 8.1  NEUTROABS 8.1*  --   --   HGB 13.5 11.0* 11.5*  HCT 42.9 34.4* 36.1*  MCV 93.1 93.7 93.0  PLT 343 271  123456    Basic Metabolic Panel: Recent Labs  Lab 02/11/21 2100 02/12/21 0413 02/13/21 0146 02/13/21 1600 02/14/21 0440 02/15/21 0530  NA  --  132* 132* 130* 128* 129*  K  --  3.6 4.1 3.5 4.1 3.6  CL  --  97* 97* 92* 90* 93*  CO2  --  '23 24 27 26 23  '$ GLUCOSE  --  144* 106* 257* 212* 146*  BUN  --  13 22* 23* 24* 28*  CREATININE  --  1.24 2.73* 2.42* 3.24* 3.32*  CALCIUM  --  8.8* 8.5* 8.0* 8.3* 8.0*  MG 1.8  --  1.8  --   --  1.9   GFR: Estimated Creatinine Clearance: 58.9 mL/min (A) (by C-G formula based on SCr of 3.32 mg/dL (H)). Recent Labs  Lab 02/11/21 1208 02/11/21 1235 02/14/21 0440 02/15/21 0530  WBC 10.3  --  9.9 8.1  LATICACIDVEN  --  1.9  --   --     Liver Function Tests: Recent Labs  Lab 02/11/21 1208 02/12/21 0413  AST 44* 35  ALT 52* 47*  ALKPHOS 68 69  BILITOT 2.1* 1.9*  PROT 7.7 7.9  ALBUMIN 3.4* 3.4*   No results for input(s): LIPASE, AMYLASE in the last 168 hours. No results for input(s): AMMONIA in the last 168 hours.  ABG    Component Value Date/Time   PHART 7.344 (L) 03/05/2020 1123   PCO2ART 73.1 (HH) 03/05/2020 1123   PO2ART 69.8 (L) 03/05/2020 1123   HCO3 30.3 (H) 10/11/2020 1152   TCO2 47 (H) 03/04/2020 1347   O2SAT 73.3 02/15/2021 0530     Coagulation Profile: No results for input(s): INR, PROTIME in the last 168 hours.  Cardiac Enzymes: No results for input(s): CKTOTAL, CKMB, CKMBINDEX, TROPONINI in the last 168 hours.  HbA1C: Hgb A1c MFr Bld  Date/Time Value Ref Range Status  02/11/2021 09:00 PM 6.5 (H) 4.8 - 5.6 % Final    Comment:    (NOTE) Pre diabetes:          5.7%-6.4%  Diabetes:              >6.4%  Glycemic control for   <7.0% adults with diabetes   10/02/2020 10:15 AM 6.1 (H) 4.8 - 5.6 % Final    Comment:    (NOTE) Pre diabetes:          5.7%-6.4%  Diabetes:              >6.4%  Glycemic control for   <7.0% adults with diabetes     CBG: No results for input(s): GLUCAP in the last 168  hours.  Review of Systems:   Full 12 point ROS performed. Negative except  + SOB  + truncal edema + extremity edema +palpitations  + fatigue + DOE   Past Medical History:  He,  has a past medical history of Biventricular congestive heart failure ([place]), Class 3 severe obesity due to excess  calories with serious comorbidity and body mass index (BMI) of 50.0 to 59.9 in adult [facility]) (02/26/2020), Essential hypertension (02/26/2020), GERD without esophagitis (02/26/2020), Hidradenitis suppurativa (02/26/2020), OSA (obstructive sleep apnea) (02/26/2020), and Prediabetes (02/26/2020).   Surgical History:   Past Surgical History:  Procedure Laterality Date   ABSCESS DRAINAGE     RIGHT/LEFT HEART CATH AND CORONARY ANGIOGRAPHY N/A 03/04/2020   Procedure: RIGHT/LEFT HEART CATH AND CORONARY ANGIOGRAPHY;  Surgeon: Larey Dresser, MD;  Location: [place] CV LAB;  Service: Cardiovascular;  Laterality: N/A;     Social History:   reports that he quit smoking about 3 years ago. His smoking use included cigarettes. He smoked an average of 1 pack per day. He has quit using smokeless tobacco. He reports that he does not drink alcohol and does not use drugs.   Family History:  His family history includes Drug abuse in his father; Heart disease in his mother; Hypertension in his mother; Pulmonary Hypertension in his mother.   Allergies Allergies  Allergen Reactions   Coreg [Carvedilol] Shortness Of Breath and Diarrhea    Wheezing      Home Medications  Prior to Admission medications   Medication Sig Start Date End Date Taking? Authorizing Provider  amiodarone (PACERONE) 200 MG tablet Take 80 mg by mouth daily. 12/18/20  Yes [provider]  amoxicillin (AMOXIL) 500 MG capsule Take 500 mg by mouth 3 (three) times daily. 02/07/21  Yes [provider]  apixaban (ELIQUIS) 5 MG TABS tablet Take 1 tablet (5 mg total) by mouth 2 (two) times daily. 11/07/20  Yes Clegg, Amy D, NP  dapagliflozin  propanediol (FARXIGA) 10 MG TABS tablet Take 1 tablet (10 mg total) by mouth daily. 04/16/20  Yes Bensimhon, Shaune Pascal, MD  digoxin (LANOXIN) 0.125 MG tablet Take 1 tablet (0.125 mg total) by mouth daily. 04/16/20  Yes Bensimhon, Shaune Pascal, MD  Potassium Chloride ER 20 MEQ TBCR Take 40 mEq by mouth 2 (two) times daily. 11/21/20 02/11/21 Yes Bensimhon, Shaune Pascal, MD  sacubitril-valsartan (ENTRESTO) 97-103 MG Take 1 tablet by mouth 2 (two) times daily.   Yes [provider]  spironolactone (ALDACTONE) 25 MG tablet TAKE 1 TABLET(25 MG) BY MOUTH DAILY Patient taking differently: Take 25 mg by mouth daily. TAKE 1 TABLET(25 MG) BY MOUTH DAILY 12/23/20  Yes Bensimhon, Shaune Pascal, MD  Torsemide 40 MG TABS Take 80 mg by mouth daily. 12/24/20  Yes Clegg, Amy D, NP     Critical care time: 41 min    CRITICAL CARE Performed by: Cristal Generous   Total critical care time: 41  minutes  Critical care time was exclusive of separately billable procedures and treating other patients. Critical care was necessary to treat or prevent imminent or life-threatening deterioration.  Critical care was time spent personally by me on the following activities: development of treatment plan with patient and/or surrogate as well as nursing, discussions with consultants, evaluation of patient's response to treatment, examination of patient, obtaining history from patient or surrogate, ordering and performing treatments and interventions, ordering and review of laboratory studies, ordering and review of radiographic studies, pulse oximetry and re-evaluation of patient's condition.   Eliseo Gum MSN, AGACNP-BC [place] for pager  02/15/2021, 1:11 PM

## 2021-02-15 NOTE — Progress Notes (Signed)
Patient ID: Phillip Young, male   DOB: 1986-07-07, 35 y.o.   MRN: [id number]     Advanced Heart Failure Rounding Note  PCP-Cardiologist: None   Subjective:    Patient remains in atrial flutter rate 90s on amiodarone 60 mg/hr.    Dobutamine 5/NE 5 ongoing with Lasix gtt 20.  CVP 26 with co-ox 73%. Creatinine 1.24 => 2.73 => 3.24 => 3.32.  UOP not vigorous, 850 cc.  Breathing comfortably on CPAP.  Had some itching resolved with Benadryl.   UA concerning for UTI. On ceftriaxone.   Echo: EF < 20%, severe LV dilation, RV not well visualized.   Objective:   Weight Range: (!) 222.5 kg Body mass index is 68.41 kg/m.   Vital Signs:   Temp:  [97.7 F (36.5 C)-97.8 F (36.6 C)] 97.7 F (36.5 C) (07/24 0410) Pulse Rate:  [83-119] 95 (07/24 0700) Resp:  [9-40] 26 (07/24 0700) BP: (75-157)/(28-130) 126/61 (07/24 0700) SpO2:  [83 %-100 %] 95 % (07/24 0700) Weight:  [222.5 kg] 222.5 kg (07/24 0505) Last BM Date: 02/12/21  Weight change: Filed Weights   02/13/21 0501 02/14/21 0530 02/15/21 0505  Weight: (!) 217.1 kg (!) 221 kg (!) 222.5 kg    Intake/Output:   Intake/Output Summary (Last 24 hours) at 02/15/2021 0730 Last data filed at 02/15/2021 0700 Gross per 24 hour  Intake 2379.4 ml  Output 850 ml  Net 1529.4 ml      Physical Exam    General: NAD Neck: Thick, JVP 16+, no thyromegaly or thyroid nodule.  Lungs: Clear to auscultation bilaterally with normal respiratory effort. CV: Nonpalpable PMI.  Heart irregular S1/S2, no S3/S4, no murmur.  1+ edema to knees.  Abdomen: Soft, nontender, no hepatosplenomegaly, no distention.  Skin: Intact without lesions or rashes.  Neurologic: Alert and oriented x 3.  Psych: Normal affect. Extremities: No clubbing or cyanosis.  HEENT: Normal.     Telemetry   Atrial flutter rate around 90s (personally reviewed)  Labs    CBC Recent Labs    02/14/21 0440 02/15/21 0530  WBC 9.9 8.1  HGB 11.0* 11.5*  HCT 34.4* 36.1*  MCV 93.7  93.0  PLT 271 [id number]   Basic Metabolic Panel Recent Labs    02/13/21 0146 02/13/21 1600 02/14/21 0440 02/15/21 0530  NA 132*   < > 128* 129*  K 4.1   < > 4.1 3.6  CL 97*   < > 90* 93*  CO2 24   < > 26 23  GLUCOSE 106*   < > 212* 146*  BUN 22*   < > 24* 28*  CREATININE 2.73*   < > 3.24* 3.32*  CALCIUM 8.5*   < > 8.3* 8.0*  MG 1.8  --   --  1.9   < > = values in this interval not displayed.   Liver Function Tests No results for input(s): AST, ALT, ALKPHOS, BILITOT, PROT, ALBUMIN in the last 72 hours.  No results for input(s): LIPASE, AMYLASE in the last 72 hours. Cardiac Enzymes No results for input(s): CKTOTAL, CKMB, CKMBINDEX, TROPONINI in the last 72 hours.  BNP: BNP (last 3 results) Recent Labs    10/10/20 1336 11/07/20 0932 02/11/21 1500  BNP 126.9* 106.0* 194.3*    ProBNP (last 3 results) No results for input(s): PROBNP in the last 8760 hours.   D-Dimer No results for input(s): DDIMER in the last 72 hours.  Hemoglobin A1C No results for input(s): HGBA1C in the last 72 hours.  Fasting  Lipid Panel No results for input(s): CHOL, HDL, LDLCALC, TRIG, CHOLHDL, LDLDIRECT in the last 72 hours. Thyroid Function Tests No results for input(s): TSH, T4TOTAL, T3FREE, THYROIDAB in the last 72 hours.  Invalid input(s): FREET3   Other results:   Imaging    No results found.   Medications:     Scheduled Medications:  chlorhexidine  15 mL Mouth Rinse BID   Chlorhexidine Gluconate Cloth  6 each Topical Daily   mouth rinse  15 mL Mouth Rinse q12n4p   metolazone  5 mg Oral BID   potassium chloride  20 mEq Oral Once   rivaroxaban  20 mg Oral Q supper   sodium chloride flush  10-40 mL Intracatheter Q12H   sodium chloride flush  3 mL Intravenous Q12H    Infusions:  sodium chloride     sodium chloride 20 mL/hr at 02/14/21 1200   amiodarone 60 mg/hr (02/15/21 0700)   cefTRIAXone (ROCEPHIN)  IV Stopped (02/14/21 0837)   DOBUTamine 5 mcg/kg/min (02/15/21 0700)    furosemide (LASIX) 200 mg in dextrose 5% 100 mL ('2mg'$ /mL) infusion 20 mg/hr (02/15/21 0700)   norepinephrine (LEVOPHED) Adult infusion 5 mcg/min (02/15/21 0700)    PRN Medications: sodium chloride, acetaminophen **OR** acetaminophen, sodium chloride flush, sodium chloride flush  Assessment/Plan   1. Acute on Chronic Biventricular Heart Failure:  Known cardiomyopathy since 2019, previously [facility] by [facility]  Echo 02/27/2020 with EF < 20%, moderate LV dilation, severely decreased RV function with severe RV dilation, severe biatrial enlargement, mild MR. No history of ETOH/drugs.  No FH of cardiomyopathy (mother with [place]).  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  He is too large for cardiac MRI. RHC/LHC on 03/04/20 with no CAD (nonischemic cardiomyopathy), elevated left and right heart filling pressures, and low CI at 2.1. 05/2020 Echo showed EF 20-25%. Evaluated by EP but not a candidate for ICD given BMI.  Echo this admission with EF < 20%, RV poorly visualized. He now has CHF exacerbation likely in setting of AFL with RVR.  He was hypotensive with poor UOP, concern for cardiogenic shock.  Dobutamine therefore started at 2.5 and increased to 5, NE added at 5.  This morning, co-ox 73% and CVP still 26 with poor UOP.  AKI with creatinine up to 3.32.  - Continue dobutamine 5 and NE 5. - Continue Lasix gtt 20, add metolazone 5 mg bid.  I am concerned that he is not going to be successful with diuretics and will need CVVH for volume removal.  Discussed with nephrology who will see him today.  - Fluid restrict <1,800 ml/ day - With AKI/hypotension, stopped Entresto, dapagliflozin, spironolactone, digoxin.  - Need attempt at DCCV to restore NSR, once volume better.  - With marked obesity, he is not a candidate for advanced therapies.  2. Aflutter w/ RVR: Noted to be in Afib prior admit, 3/22, in setting of CAP and a/c CHF but converted to NSR on  amiodarone. Discharged home w/ eliquis. In 2:1 AFL this admit in setting of a/c CHF w/ marked volume overload, + recent gastroenteritis w/ diarrhea. Has OSA but not using CPAP. TSH WNL. Admits to intermittent compliance w/ Eliquis. Has missed several doses in the last month.  Now on amiodarone gtt 60 mg/hr with HR 90s.  - Continue amio gtt 60 mg/hr.  - Continue Eliquis 5 mg bid.  - Plan TEE/DCCV this admit, will need more diuresis first. 3. Acute Hypoxic Respiratory Failure: suspect underlying OHS/OSA exacerbated  by CHF this admission.  - Continue oxygen + CPAP.  - Diurese.  4. Type 2 DM: Farxiga held with AKI.  5. OSA: Has not been on Bipap at home.  6. Super Morbid Obesity: Body mass index is 66.26 kg/m. - wt loss advised 7. Tooth Abscess: Has been on amoxicillin but changing to ceftriaxone with ?UTI.  8. UTI: Suspect by UA, sent urine culture.  ?Urinary obstruction related to this => no hydronephrosis on renal US.  - Ceftriaxone IV 9. Elevated Hs Trop: Suspect demand ischemia in setting of CHF + rapid AFL. Cath 8/21 showed no CAD.  10. AKI: Creatinine up to 3.32 today though BUN not markedly high.  Concern for cardiorenal syndrome.  Renal US with no hydronephrosis.  - With poor UOP and rising creatinine + marked volume overload, I am concerned he will require CVVH.  Have asked nephrology to see.    CRITICAL CARE Performed by: [name]  Total critical care time: 40 minutes  Critical care time was exclusive of separately billable procedures and treating other patients.  Critical care was necessary to treat or prevent imminent or life-threatening deterioration.  Critical care was time spent personally by me on the following activities: development of treatment plan with patient and/or surrogate as well as nursing, discussions with consultants, evaluation of patient's response to treatment, examination of patient, obtaining history from patient or surrogate, ordering and performing  treatments and interventions, ordering and review of laboratory studies, ordering and review of radiographic studies, pulse oximetry and re-evaluation of patient's condition.    Length of Stay: [place], MD  02/15/2021, 7:30 AM  Advanced Heart Failure Team Pager [phone] (M-F; 7a - 5p)  Please contact [facility] for night-coverage after hours (5p -7a ) and weekends on [email or web address]

## 2021-02-15 NOTE — Progress Notes (Signed)
PT placed on cpap for the night. 

## 2021-02-15 NOTE — Plan of Care (Signed)
  Problem: Health Behavior/Discharge Planning: Goal: Ability to manage health-related needs will improve Outcome: Progressing   Problem: Clinical Measurements: Goal: Ability to maintain clinical measurements within normal limits will improve Outcome: Progressing Goal: Diagnostic test results will improve Outcome: Progressing Goal: Cardiovascular complication will be avoided Outcome: Progressing

## 2021-02-15 NOTE — Plan of Care (Signed)

## 2021-02-15 NOTE — Progress Notes (Addendum)
eLink Physician-Brief Progress Note Patient Name: Phillip Young DOB: January 30, 1986 MRN: [id number]   Date of Service  02/15/2021  HPI/Events of Note  Complians of generalized itching Received Benadryl 25 mg last night with transient relief and received a second dose  eICU Interventions  Ordered Benadryl 50 mg PO x 1     Intervention Category Minor Interventions: Routine modifications to care plan (e.g. PRN medications for pain, fever)  Shona Needles Analis Distler 02/15/2021, 10:33 PM

## 2021-02-15 NOTE — Consult Note (Signed)
Renal Service Consult Note [facility]  Phillip Young 02/15/2021 Sol Blazing, MD Requesting Physician: Dr. Tamala Julian, D.   Reason for Consult: Renal failure  HPI: The patient is a 35 y.o. year-old w/ hx of severe LV/ RV heart failure, super morbid obesity, HTN, OSA presented on 7/20 w/ SOB x 1 week. ED showed 68% SpO2 on RA, CXR showed diffuse IS edema, +orthopnea, c/o ^'d abd swelling.Cards consulted and IV lasix started and pt admitted. Atrial w/ RVR may have been cause of decomp CHF per cardiology team.  ECHO showed EF <20%. BP's dropped and pt moved to ICU for suspected cardiogenic shock. Started on IV dobutamine and norepi gtt's, Lasix gtt going now at max dose '20mg'$  / hr. CVP remains high w/ UOP < 1 liter day. AKI w/ creat up to 3.3 today (1.2 on admit).  Asked to see for CRRT.    Pt seen in ICU, pt on bipap, no current CP or abd pain.  Bladder scans have been zero, no foley in . Renal US today showed no hydro bilat.  UA 7/21 showed prot 100, 11-20 rbc, >50 wbc     ROS - denies CP, no joint pain, no HA, no blurry vision, no rash, no diarrhea, no nausea/ vomiting, no dysuria, no difficulty voiding   Past Medical History  Past Medical History:  Diagnosis Date   Biventricular congestive heart failure ([place])    Last Echo 11/2019 at [facility] reveals EF 20%   Class 3 severe obesity due to excess calories with serious comorbidity and body mass index (BMI) of 50.0 to 59.9 in adult [facility]) 02/26/2020   Essential hypertension 02/26/2020   GERD without esophagitis 02/26/2020   Hidradenitis suppurativa 02/26/2020   OSA (obstructive sleep apnea) 02/26/2020   Prediabetes 02/26/2020   Past Surgical History  Past Surgical History:  Procedure Laterality Date   ABSCESS DRAINAGE     RIGHT/LEFT HEART CATH AND CORONARY ANGIOGRAPHY N/A 03/04/2020   Procedure: RIGHT/LEFT HEART CATH AND CORONARY ANGIOGRAPHY;  Surgeon: Larey Dresser, MD;  Location: [facility];  Service: Cardiovascular;   Laterality: N/A;   Family History  Family History  Problem Relation Age of Onset   Heart disease Mother    Hypertension Mother    Pulmonary Hypertension Mother    Drug abuse Father        died due to Heroin overdose   Social History  reports that he quit smoking about 3 years ago. His smoking use included cigarettes. He smoked an average of 1 pack per day. He has quit using smokeless tobacco. He reports that he does not drink alcohol and does not use drugs. Allergies  Allergies  Allergen Reactions   Coreg [Carvedilol] Shortness Of Breath and Diarrhea    Wheezing    Home medications Prior to Admission medications   Medication Sig Start Date End Date Taking? Authorizing Provider  amiodarone (PACERONE) 200 MG tablet Take 80 mg by mouth daily. 12/18/20  Yes [provider]  amoxicillin (AMOXIL) 500 MG capsule Take 500 mg by mouth 3 (three) times daily. 02/07/21  Yes [provider]  apixaban (ELIQUIS) 5 MG TABS tablet Take 1 tablet (5 mg total) by mouth 2 (two) times daily. 11/07/20  Yes Clegg, Amy D, NP  dapagliflozin propanediol (FARXIGA) 10 MG TABS tablet Take 1 tablet (10 mg total) by mouth daily. 04/16/20  Yes Bensimhon, Shaune Pascal, MD  digoxin (LANOXIN) 0.125 MG tablet Take 1 tablet (0.125 mg total) by mouth  daily. 04/16/20  Yes Bensimhon, Shaune Pascal, MD  Potassium Chloride ER 20 MEQ TBCR Take 40 mEq by mouth 2 (two) times daily. 11/21/20 02/11/21 Yes Bensimhon, Shaune Pascal, MD  sacubitril-valsartan (ENTRESTO) 97-103 MG Take 1 tablet by mouth 2 (two) times daily.   Yes [provider]  spironolactone (ALDACTONE) 25 MG tablet TAKE 1 TABLET(25 MG) BY MOUTH DAILY Patient taking differently: Take 25 mg by mouth daily. TAKE 1 TABLET(25 MG) BY MOUTH DAILY 12/23/20  Yes Bensimhon, Shaune Pascal, MD  Torsemide 40 MG TABS Take 80 mg by mouth daily. 12/24/20  Yes Clegg, Amy D, NP     Vitals:   02/15/21 1100 02/15/21 1200 02/15/21 1300 02/15/21 1344  BP: (!) 129/94 103/68 (!) 82/54    Pulse: (!) 108 97 (!) 102   Resp: (!) 23 (!) 30 (!) 23   Temp:      TempSrc:      SpO2: 94% 95% 93% 92%  Weight:      Height:       Exam Gen alert, no distress No rash, cyanosis or gangrene Sclera anicteric, throat clear  No jvd or bruits Chest clear bilat to bases, no rales/ wheezing RRR no MRG Abd soft ntnd no mass or ascites +bs GU normal MS no joint effusions or deformity Ext 2+ UE > LE and abd pannus edema Neuro is groggy , on bipap/ cpap mask  CXR > diffuse IS edema  CVP >> 28- 32   Assessment/ Plan: AKI - creat 1.2 on admit, up now to 3.3 in setting on cardiogenic shock, long-standing hx of severe bivent CHF, now w/ aflutter/ RVR.  Vol overload refractory to inotropes, pressors and IV lasix gtt.  Recommend CRRT, max UF as tolerated.  Vol overload - diffuse pulm edema by CXR, on bipap, prob ascites on exam, ^^CVP RV/ LV heart failure - per cardiology / CHF team Morbid obesity      Phillip Splinter  MD 02/15/2021, 2:28 PM  Recent Labs  Lab 02/14/21 0440 02/15/21 0530  WBC 9.9 8.1  HGB 11.0* 11.5*   Recent Labs  Lab 02/14/21 0440 02/15/21 0530  K 4.1 3.6  BUN 24* 28*  CREATININE 3.24* 3.32*  CALCIUM 8.3* 8.0*

## 2021-02-16 DIAGNOSIS — N179 Acute kidney failure, unspecified: Secondary | ICD-10-CM | POA: Diagnosis not present

## 2021-02-16 DIAGNOSIS — I5023 Acute on chronic systolic (congestive) heart failure: Secondary | ICD-10-CM | POA: Diagnosis not present

## 2021-02-16 LAB — RENAL FUNCTION PANEL
Albumin: 2.8 g/dL — ABNORMAL LOW (ref 3.5–5.0)
Albumin: 3.1 g/dL — ABNORMAL LOW (ref 3.5–5.0)
Anion gap: 8 (ref 5–15)
Anion gap: 7 (ref 5–15)
BUN: 25 mg/dL — ABNORMAL HIGH (ref 6–20)
BUN: 19 mg/dL (ref 6–20)
CO2: 26 mmol/L (ref 22–32)
CO2: 28 mmol/L (ref 22–32)
Calcium: 7.8 mg/dL — ABNORMAL LOW (ref 8.9–10.3)
Calcium: 8.1 mg/dL — ABNORMAL LOW (ref 8.9–10.3)
Chloride: 93 mmol/L — ABNORMAL LOW (ref 98–111)
Chloride: 95 mmol/L — ABNORMAL LOW (ref 98–111)
Creatinine, Ser: 2.56 mg/dL — ABNORMAL HIGH (ref 0.61–1.24)
Creatinine, Ser: 1.96 mg/dL — ABNORMAL HIGH (ref 0.61–1.24)
GFR, Estimated: 33 mL/min — ABNORMAL LOW (ref >60)
GFR, Estimated: 45 mL/min — ABNORMAL LOW (ref >60)
Glucose, Bld: 225 mg/dL — ABNORMAL HIGH (ref 70–99)
Glucose, Bld: 118 mg/dL — ABNORMAL HIGH (ref 70–99)
Phosphorus: 4.4 mg/dL (ref 2.5–4.6)
Phosphorus: 3.2 mg/dL (ref 2.5–4.6)
Potassium: 3.7 mmol/L (ref 3.5–5.1)
Potassium: 4.2 mmol/L (ref 3.5–5.1)
Sodium: 127 mmol/L — ABNORMAL LOW (ref 135–145)
Sodium: 130 mmol/L — ABNORMAL LOW (ref 135–145)

## 2021-02-16 LAB — CBC
HCT: 34 % — ABNORMAL LOW (ref 39.0–52.0)
Hemoglobin: 10.8 g/dL — ABNORMAL LOW (ref 13.0–17.0)
MCH: 29.7 pg (ref 26.0–34.0)
MCHC: 31.8 g/dL (ref 30.0–36.0)
MCV: 93.4 fL (ref 80.0–100.0)
Platelets: 241 10*3/uL (ref 150–400)
RBC: 3.64 MIL/uL — ABNORMAL LOW (ref 4.22–5.81)
RDW: 14.8 % (ref 11.5–15.5)
WBC: 7.1 10*3/uL (ref 4.0–10.5)
nRBC: 0 % (ref 0.0–0.2)

## 2021-02-16 LAB — COOXEMETRY PANEL
Carboxyhemoglobin: 1.1 % (ref 0.5–1.5)
Methemoglobin: 1.1 % (ref 0.0–1.5)
O2 Saturation: 68.5 %
Total hemoglobin: 10.7 g/dL — ABNORMAL LOW (ref 12.0–16.0)

## 2021-02-16 LAB — APTT: aPTT: 45 seconds — ABNORMAL HIGH (ref 24–36)

## 2021-02-16 LAB — MAGNESIUM: Magnesium: 2.3 mg/dL (ref 1.7–2.4)

## 2021-02-16 MED ORDER — APIXABAN 5 MG PO TABS
5.0000 mg | ORAL_TABLET | Freq: Two times a day (BID) | ORAL | Status: DC
  Administered 2021-02-16 – 2021-02-20 (×9): 5 mg via ORAL
  Filled 2021-02-16 (×10): qty 1

## 2021-02-16 NOTE — Progress Notes (Signed)
[facility] Progress Note   35 y.o. year-old w/ hx of severe LV/ RV heart failure, super morbid obesity, HTN, OSA presented on 7/20 w/ SOB x 1 week, c/w CHF ->  IV lasix started and pt admitted. Atrial w/ RVR may have been cause of decomp CHF per cardiology team.  ECHO showed EF <20%. BP's dropped and pt moved to ICU for suspected cardiogenic shock. Started on IV dobutamine and norepi gtt's. CRRT started 7/24.  Assessment/ Plan:   AKI - creat 1.2 on admit, up to 3.3 in setting on cardiogenic shock, long-standing hx of severe bivent CHF, now w/ aflutter/ RVR.  Vol overload refractory to inotropes, pressors and IV lasix gtt ->  CRRT 7/24 ->    Seen on CRRT RIJ temp 400/200/1800 pre/post/qd Net UF 100-150 currently on levo and amio  He's markedly up on his EDW (402 lbs earlier in 2022); continue on CRRT with max UF as tolerated  Vol overload - diffuse pulm edema by CXR, on bipap, prob ascites on exam, ^^CVP RV/ LV heart failure - per cardiology / CHF team -> waiting to optimize fluid status (markedly overloaded) prior to attempting DCCV. UTI on Ceftriaxone Morbid obesity. DM  Subjective:   Aflutter in 80's on amio, dobutamine with lasix gtt, CVP >20 with good UOP Wt 486 lbs.   Objective:   BP 135/79   Pulse 90   Temp 97.6 F (36.4 C) (Axillary)   Resp (!) 22   Ht '5\' 11"'$  (1.803 m)   Wt (!) 220.8 kg   SpO2 92%   BMI 67.89 kg/m   Intake/Output Summary (Last 24 hours) at 02/16/2021 1028 Last data filed at 02/16/2021 1000 Gross per 24 hour  Intake 3362.47 ml  Output 3973 ml  Net -610.53 ml   Weight change: -1.7 kg  Physical Exam: Gen alert, no distress No rash, cyanosis or gangrene Sclera anicteric, throat clear No jvd or bruits Chest clear bilat to bases, no rales/ wheezing RRR no MRG Abd soft ntnd no mass or ascites +bs GU normal MS no joint effusions or deformity Ext 2+ UE > LE and abd pannus edema Neuro is groggy , on bipap/ cpap mask  Imaging: DG  Chest 1 View  Result Date: 02/15/2021 CLINICAL DATA:  Central line placement.  History of CHF. EXAM: CHEST  1 VIEW COMPARISON:  February 13, 2021 FINDINGS: A new right central line terminates in the central SVC. No pneumothorax. Cardiomegaly and probable edema remain on this limited study. No other interval changes. IMPRESSION: 1. The new right central line is in good position terminating in the central SVC without pneumothorax. 2. Cardiomegaly and pulmonary edema. Electronically Signed   By: Dorise Bullion III M.D   On: 02/15/2021 15:43    Labs: BMET Recent Labs  Lab 02/12/21 0413 02/13/21 0146 02/13/21 1600 02/14/21 0440 02/15/21 0530 02/15/21 1600 02/16/21 0428  NA 132* 132* 130* 128* 129* 126* 127*  K 3.6 4.1 3.5 4.1 3.6 4.2 3.7  CL 97* 97* 92* 90* 93* 91* 93*  CO2 '23 24 27 26 23 27 26  '$ GLUCOSE 144* 106* 257* 212* 146* 126* 225*  BUN 13 22* 23* 24* 28* 31* 25*  CREATININE 1.24 2.73* 2.42* 3.24* 3.32* 3.45* 2.56*  CALCIUM 8.8* 8.5* 8.0* 8.3* 8.0* 8.1* 7.8*  PHOS  --   --   --   --   --  5.8* 4.4   CBC Recent Labs  Lab 02/11/21 1208 02/14/21 0440 02/15/21 0530 02/16/21 0428  WBC 10.3 9.9 8.1 7.1  NEUTROABS 8.1*  --   --   --   HGB 13.5 11.0* 11.5* 10.8*  HCT 42.9 34.4* 36.1* 34.0*  MCV 93.1 93.7 93.0 93.4  PLT 343 271 274 241    Medications:     apixaban  5 mg Oral BID   chlorhexidine  15 mL Mouth Rinse BID   Chlorhexidine Gluconate Cloth  6 each Topical Daily   mouth rinse  15 mL Mouth Rinse q12n4p   sodium chloride flush  10-40 mL Intracatheter Q12H   sodium chloride flush  3 mL Intravenous Q12H      [name], MD 02/16/2021, 10:28 AM

## 2021-02-16 NOTE — Progress Notes (Signed)
Patient ID: Phillip Young, male   DOB: 05-27-1986, 35 y.o.   MRN: [id number]     Advanced Heart Failure Rounding Note  PCP-Cardiologist: None   Subjective:    Patient remains in atrial flutter rate 80s on amiodarone 60 mg/hr.    Dobutamine 5/NE 3 ongoing with Lasix gtt 20.  CVP >20 with co-ox 68.5%. He is now on CVVH, had about 1450 UOP however.   Sleeping comfortably on CPAP.  Had some itching resolved with Benadryl.   UA concerning for UTI. On ceftriaxone.   Echo: EF < 20%, severe LV dilation, RV not well visualized.   Objective:   Weight Range: (!) 220.8 kg Body mass index is 67.89 kg/m.   Vital Signs:   Temp:  [97.6 F (36.4 C)] 97.6 F (36.4 C) (07/24 2017) Pulse Rate:  [51-110] 99 (07/25 0630) Resp:  [5-35] 19 (07/25 0630) BP: (62-136)/(41-118) 84/69 (07/25 0530) SpO2:  [86 %-100 %] 94 % (07/25 0630) FiO2 (%):  [44 %] 44 % (07/24 1344) Weight:  [220.8 kg] 220.8 kg (07/25 0500) Last BM Date: 02/15/21  Weight change: Filed Weights   02/14/21 0530 02/15/21 0505 02/16/21 0500  Weight: (!) 221 kg (!) 222.5 kg (!) 220.8 kg    Intake/Output:   Intake/Output Summary (Last 24 hours) at 02/16/2021 0713 Last data filed at 02/16/2021 0600 Gross per 24 hour  Intake 2869.7 ml  Output 2960 ml  Net -90.3 ml      Physical Exam    General: NAD Neck: Thick, JVP difficult but elevated, no thyromegaly or thyroid nodule.  Lungs: Clear to auscultation bilaterally with normal respiratory effort. CV: Nonpalpable PMI.  Heart irregular S1/S2, no S3/S4, no murmur.  1+ edema to thighs.  Abdomen: Soft, nontender, no hepatosplenomegaly, no distention.  Skin: Intact without lesions or rashes.  Neurologic: Alert and oriented x 3.  Psych: Normal affect. Extremities: No clubbing or cyanosis.  HEENT: Normal.    Telemetry   Atrial flutter rate around 80s (personally reviewed)  Labs    CBC Recent Labs    02/15/21 0530 02/16/21 0428  WBC 8.1 7.1  HGB 11.5* 10.8*  HCT 36.1*  34.0*  MCV 93.0 93.4  PLT 274 [id number]   Basic Metabolic Panel Recent Labs    02/15/21 0530 02/15/21 1600 02/16/21 0428  NA 129* 126* 127*  K 3.6 4.2 3.7  CL 93* 91* 93*  CO2 '23 27 26  '$ GLUCOSE 146* 126* 225*  BUN 28* 31* 25*  CREATININE 3.32* 3.45* 2.56*  CALCIUM 8.0* 8.1* 7.8*  MG 1.9  --  2.3  PHOS  --  5.8* 4.4   Liver Function Tests Recent Labs    02/15/21 1600 02/16/21 0428  ALBUMIN 3.1* 2.8*    No results for input(s): LIPASE, AMYLASE in the last 72 hours. Cardiac Enzymes No results for input(s): CKTOTAL, CKMB, CKMBINDEX, TROPONINI in the last 72 hours.  BNP: BNP (last 3 results) Recent Labs    10/10/20 1336 11/07/20 0932 02/11/21 1500  BNP 126.9* 106.0* 194.3*    ProBNP (last 3 results) No results for input(s): PROBNP in the last 8760 hours.   D-Dimer No results for input(s): DDIMER in the last 72 hours.  Hemoglobin A1C No results for input(s): HGBA1C in the last 72 hours.  Fasting Lipid Panel No results for input(s): CHOL, HDL, LDLCALC, TRIG, CHOLHDL, LDLDIRECT in the last 72 hours. Thyroid Function Tests No results for input(s): TSH, T4TOTAL, T3FREE, THYROIDAB in the last 72 hours.  Invalid input(s): FREET3  Other results:   Imaging    DG Chest 1 View  Result Date: 02/15/2021 CLINICAL DATA:  Central line placement.  History of CHF. EXAM: CHEST  1 VIEW COMPARISON:  February 13, 2021 FINDINGS: A new right central line terminates in the central SVC. No pneumothorax. Cardiomegaly and probable edema remain on this limited study. No other interval changes. IMPRESSION: 1. The new right central line is in good position terminating in the central SVC without pneumothorax. 2. Cardiomegaly and pulmonary edema. Electronically Signed   By: Dorise Bullion III M.D   On: 02/15/2021 15:43     Medications:     Scheduled Medications:  chlorhexidine  15 mL Mouth Rinse BID   Chlorhexidine Gluconate Cloth  6 each Topical Daily   mouth rinse  15 mL Mouth Rinse  q12n4p   rivaroxaban  20 mg Oral Q supper   sodium chloride flush  10-40 mL Intracatheter Q12H   sodium chloride flush  3 mL Intravenous Q12H    Infusions:   prismasol BGK 4/2.5 400 mL/hr at 02/15/21 1643    prismasol BGK 4/2.5 200 mL/hr at 02/15/21 1643   sodium chloride     sodium chloride Stopped (02/15/21 2116)   amiodarone 60 mg/hr (02/16/21 0600)   cefTRIAXone (ROCEPHIN)  IV Stopped (02/15/21 0944)   DOBUTamine 5 mcg/kg/min (02/16/21 0600)   norepinephrine (LEVOPHED) Adult infusion 3 mcg/min (02/16/21 0600)   prismasol BGK 4/2.5 1,800 mL/hr at 02/15/21 2313    PRN Medications: sodium chloride, acetaminophen **OR** acetaminophen, alteplase, heparin, sodium chloride, sodium chloride flush, sodium chloride flush  Assessment/Plan   1. Acute on Chronic Biventricular Heart Failure:  Known cardiomyopathy since 2019, previously followed by [facility].  Echo 02/27/2020 with EF < 20%, moderate LV dilation, severely decreased RV function with severe RV dilation, severe biatrial enlargement, mild MR. No history of ETOH/drugs.  No FH of cardiomyopathy (mother with [place]).  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  He is too large for cardiac MRI. RHC/LHC on 03/04/20 with no CAD (nonischemic cardiomyopathy), elevated left and right heart filling pressures, and low CI at 2.1. 05/2020 Echo showed EF 20-25%. Evaluated by EP but not a candidate for ICD given BMI.  Echo this admission with EF < 20%, RV poorly visualized. He now has CHF exacerbation likely in setting of AFL with RVR.  He was hypotensive with poor UOP, concern for cardiogenic shock.  Dobutamine therefore started at 2.5 and increased to 5, NE added at 3 currently.  This morning, co-ox 68.5% and CVP still >20.  With progressive renal failure, CVVH started.   - Continue dobutamine 5 and NE 3, can place arterial line if needed. - Now on CVVH, can aim to pull 100-150 cc/hr net negative UF today as BP  tolerates.  - Stop Lasix/metolazone while getting CVVH.  - Fluid restrict <1,800 ml/ day - With AKI/hypotension, stopped Entresto, dapagliflozin, spironolactone, digoxin.  - Need attempt at DCCV to restore NSR, once volume better.  - With marked obesity, he is not a candidate for advanced therapies.  2. Aflutter w/ RVR: Noted to be in Afib prior admit, 3/22, in setting of CAP and a/c CHF but converted to NSR on amiodarone. Discharged home w/ eliquis. In 2:1 AFL this admit in setting of a/c CHF w/ marked volume overload, + recent gastroenteritis w/ diarrhea. Has OSA but not using CPAP. TSH WNL. Admits to intermittent compliance w/ Eliquis. Has missed several doses in the last month.  Now on  amiodarone gtt 60 mg/hr with HR 80s.  - Decrease amiodarone to 30 mg/hr.  - Continue Eliquis 5 mg bid.  - Plan TEE/DCCV this admit, will need more diuresis first. 3. Acute Hypoxic Respiratory Failure: suspect underlying OHS/OSA exacerbated by CHF this admission.  - Continue oxygen + CPAP.  - Diurese.  4. Type 2 DM: Farxiga held with AKI.  5. OSA: Has not been on Bipap at home.  6. Super Morbid Obesity: Body mass index is 66.26 kg/m. - wt loss advised 7. Tooth Abscess: Has been on amoxicillin but changing to ceftriaxone with ?UTI.  8. UTI: Suspect by UA, sent urine culture.  ?Urinary obstruction related to this => no hydronephrosis on renal US.  - Ceftriaxone IV 9. Elevated Hs Trop: Suspect demand ischemia in setting of CHF + rapid AFL. Cath 8/21 showed no CAD.  10. AKI: Creatinine up to 3.32 today though BUN not markedly high.  Concern for cardiorenal syndrome.  Renal US with no hydronephrosis. With poor UOP and rising creatinine + marked volume overload, he was started on CVVH.  - Continue CVVH, pulling 100-150 cc/hr.    CRITICAL CARE Performed by: [name]  Total critical care time: 40 minutes  Critical care time was exclusive of separately billable procedures and treating other  patients.  Critical care was necessary to treat or prevent imminent or life-threatening deterioration.  Critical care was time spent personally by me on the following activities: development of treatment plan with patient and/or surrogate as well as nursing, discussions with consultants, evaluation of patient's response to treatment, examination of patient, obtaining history from patient or surrogate, ordering and performing treatments and interventions, ordering and review of laboratory studies, ordering and review of radiographic studies, pulse oximetry and re-evaluation of patient's condition.    Length of Stay: 5  [name], MD  02/16/2021, 7:13 AM  Advanced Heart Failure Team Pager [phone] (M-F; [place])  Please contact [facility] for night-coverage after hours (5p -7a ) and weekends on [email or web address]

## 2021-02-16 NOTE — Progress Notes (Signed)
Patient found on 72mg of norepi but current order is non titratable at 365m. Will attempt to wean to 78m55mif tolerated.

## 2021-02-16 NOTE — Progress Notes (Signed)
NAME:  Phillip Young, MRN:  [id number], DOB:  07-11-1986, LOS: 5 ADMISSION DATE:  02/11/2021, CONSULTATION DATE:  02/15/21 REFERRING MD:  Harvin Hazel HF  -- [name] , CHIEF COMPLAINT:  decompensated heart failure, renal failure    History of Present Illness:  35 yo M PMH class V obesity, biventricular heart failure, HTN, OSA presented to ED 7/20 with CC SOB, palpitations x 1 week. Associated dry cough, intermittent hypoxia, edema. Admitted to [facility] for management of acute on chronic heart failure, rapid aflutter. Got '80mg'$  IV lasix, amio. 7/21 Adv HF consulted for further assistance. 7/24 patient in the CVICU on a lasix infusion as well as dobutamine without robust response to diuresis (notably patient is refusing foley catheter placement). He was started on norepi 7/24 with persistent soft Bps amd worsening AKI, volume overload.   Critical care consulted in this setting  Pertinent  Medical History  Biventricular heart failure OSA Class V obesity GERD HTN DM2  Significant Hospital Events: Including procedures, antibiotic start and stop dates in addition to other pertinent events   7/20 admitted to [facility] for Afl RVR, acute on chronic BiV HF, hypoxia. Amio, push dose lasix  7/21 adv HF consult, started dobutamine, lasix gtt  7/22 ECHO with LVEF <20% (worse than prior). Hard to assess RV with technically difficult study  7/24 adding NE. CCM consult. Worse renal function, on lasix gtt but teetering need for CRRT   Interim History / Subjective:  Making urine. In good spirits. Upset that night nursing would not let him sit on side of bed to pee.  Objective   Blood pressure 111/67, pulse 89, temperature 97.6 F (36.4 C), temperature source Axillary, resp. rate (!) 28, height '5\' 11"'$  (1.803 m), weight (!) 220.8 kg, SpO2 100 %. CVP:  [20 mmHg-31 mmHg] 21 mmHg  FiO2 (%):  [44 %] 44 %   Intake/Output Summary (Last 24 hours) at 02/16/2021 0746 Last data filed at 02/16/2021 0700 Gross per 24 hour  Intake  2940.46 ml  Output 3167 ml  Net -226.54 ml    Filed Weights   02/14/21 0530 02/15/21 0505 02/16/21 0500  Weight: (!) 221 kg (!) 222.5 kg (!) 220.8 kg    Examination: No distress CVVH ongoing Ext warm Diffuse anasarca Moves all 4 ext, mentating Heart sounds irregular  Chemistries improved with CVVH Net as above Coox 68% on milrinone, levophed CBG okay  Resolved Hospital Problem list     Assessment & Plan:  Acute decompensated heart failure with pulmonary edema- about 60 lbs above dry weight Afib on AC Cardiorenal syndrome Class 5 obesity OSA/OHS Hypomagnesemia  - Continue CVVH with fluid offloading - Wean levophed for MAP 65 - Continue NoAC and amiodarone - Dobutamine dosing per CHF team, appreciate help - Needs about 50 more lbs off - Encourage night-time and PRN BIPAP to help offload LV  Best Practice (right click and "Reselect all SmartList Selections" daily)   Diet/type: diabetic DVT prophylaxis: DOAC GI prophylaxis: N/A Lines: yes and it is still needed  Foley:  N/A, refuses Code Status:  full code Last date of multidisciplinary goals of care discussion [pending] Dispo: CVICU    Patient critically ill due to cardiogenic shock, renal failure Interventions to address this today CVVH, pressor titration Risk of deterioration without these interventions is high  I personally spent 34 minutes providing critical care not including any separately billable procedures  Erskine Emery MD [facility]  Prefer epic messenger for cross cover needs If after hours,  please call E-link

## 2021-02-16 NOTE — Progress Notes (Signed)
RT at bedside to place CPAP. Patient is not ready at this time and can place himself on /off as needed. Patient aware to call for assistance as needed.

## 2021-02-16 NOTE — Plan of Care (Signed)
  Problem: Clinical Measurements: Goal: Ability to maintain clinical measurements within normal limits will improve Outcome: Progressing Goal: Will remain free from infection Outcome: Progressing Goal: Diagnostic test results will improve Outcome: Progressing Goal: Respiratory complications will improve Outcome: Progressing Goal: Cardiovascular complication will be avoided Outcome: Progressing   Problem: Elimination: Goal: Will not experience complications related to bowel motility Outcome: Progressing Goal: Will not experience complications related to urinary retention Outcome: Progressing   Problem: Pain Managment: Goal: General experience of comfort will improve Outcome: Progressing   

## 2021-02-17 ENCOUNTER — Encounter (HOSPITAL_COMMUNITY)
Admission: EM | Disposition: A | Discharge status: Home / Self Care | Payer: Self-pay | Source: Home / Self Care | Attending: Cardiology

## 2021-02-17 ENCOUNTER — Inpatient Hospital Stay (HOSPITAL_COMMUNITY): Payer: BC Managed Care – PPO

## 2021-02-17 DIAGNOSIS — I5023 Acute on chronic systolic (congestive) heart failure: Secondary | ICD-10-CM | POA: Diagnosis not present

## 2021-02-17 DIAGNOSIS — R570 Cardiogenic shock: Secondary | ICD-10-CM

## 2021-02-17 DIAGNOSIS — I4892 Unspecified atrial flutter: Secondary | ICD-10-CM | POA: Diagnosis not present

## 2021-02-17 LAB — CBC
HCT: 37.3 % — ABNORMAL LOW (ref 39.0–52.0)
Hemoglobin: 11.7 g/dL — ABNORMAL LOW (ref 13.0–17.0)
MCH: 29.4 pg (ref 26.0–34.0)
MCHC: 31.4 g/dL (ref 30.0–36.0)
MCV: 93.7 fL (ref 80.0–100.0)
Platelets: 249 10*3/uL (ref 150–400)
RBC: 3.98 MIL/uL — ABNORMAL LOW (ref 4.22–5.81)
RDW: 14.7 % (ref 11.5–15.5)
WBC: 7.9 10*3/uL (ref 4.0–10.5)
nRBC: 0 % (ref 0.0–0.2)

## 2021-02-17 LAB — RENAL FUNCTION PANEL
Albumin: 3.3 g/dL — ABNORMAL LOW (ref 3.5–5.0)
Albumin: 3.3 g/dL — ABNORMAL LOW (ref 3.5–5.0)
Anion gap: 7 (ref 5–15)
Anion gap: 6 (ref 5–15)
BUN: 17 mg/dL (ref 6–20)
BUN: 16 mg/dL (ref 6–20)
CO2: 27 mmol/L (ref 22–32)
CO2: 28 mmol/L (ref 22–32)
Calcium: 8.5 mg/dL — ABNORMAL LOW (ref 8.9–10.3)
Calcium: 8.4 mg/dL — ABNORMAL LOW (ref 8.9–10.3)
Chloride: 97 mmol/L — ABNORMAL LOW (ref 98–111)
Chloride: 97 mmol/L — ABNORMAL LOW (ref 98–111)
Creatinine, Ser: 1.56 mg/dL — ABNORMAL HIGH (ref 0.61–1.24)
Creatinine, Ser: 1.37 mg/dL — ABNORMAL HIGH (ref 0.61–1.24)
GFR, Estimated: 59 mL/min — ABNORMAL LOW (ref >60)
GFR, Estimated: >60 mL/min (ref >60)
Glucose, Bld: 138 mg/dL — ABNORMAL HIGH (ref 70–99)
Glucose, Bld: 114 mg/dL — ABNORMAL HIGH (ref 70–99)
Phosphorus: 2.5 mg/dL (ref 2.5–4.6)
Phosphorus: 2.3 mg/dL — ABNORMAL LOW (ref 2.5–4.6)
Potassium: 4.6 mmol/L (ref 3.5–5.1)
Potassium: 4.5 mmol/L (ref 3.5–5.1)
Sodium: 131 mmol/L — ABNORMAL LOW (ref 135–145)
Sodium: 131 mmol/L — ABNORMAL LOW (ref 135–145)

## 2021-02-17 LAB — MAGNESIUM: Magnesium: 2.4 mg/dL (ref 1.7–2.4)

## 2021-02-17 LAB — COOXEMETRY PANEL
Carboxyhemoglobin: 1.2 % (ref 0.5–1.5)
Methemoglobin: 0.9 % (ref 0.0–1.5)
O2 Saturation: 54.6 %
Total hemoglobin: 11.6 g/dL — ABNORMAL LOW (ref 12.0–16.0)

## 2021-02-17 LAB — APTT: aPTT: 34 seconds (ref 24–36)

## 2021-02-17 SURGERY — CARDIOVERSION
Anesthesia: Monitor Anesthesia Care

## 2021-02-17 MED ORDER — RENA-VITE PO TABS
1.0000 | ORAL_TABLET | Freq: Every day | ORAL | Status: DC
  Administered 2021-02-17 – 2021-03-25 (×37): 1 via ORAL
  Filled 2021-02-17 (×37): qty 1

## 2021-02-17 MED ORDER — PROSOURCE PLUS PO LIQD
30.0000 mL | Freq: Three times a day (TID) | ORAL | Status: DC
  Administered 2021-02-17 – 2021-03-01 (×17): 30 mL via ORAL
  Filled 2021-02-17 (×18): qty 30

## 2021-02-17 MED ORDER — ENSURE ENLIVE PO LIQD
237.0000 mL | Freq: Two times a day (BID) | ORAL | Status: DC
  Administered 2021-02-17 – 2021-02-18 (×2): 237 mL via ORAL

## 2021-02-17 NOTE — Progress Notes (Signed)
Nutrition Follow-up  DOCUMENTATION CODES:   Morbid obesity  INTERVENTION:   30 ml ProSource Plus TID, each supplement provides 100 kcals and 15 grams protein.   Ensure Enlive po BID, each supplement provides 350 kcal and 20 grams of protein   Add Renal MVI daily   NUTRITION DIAGNOSIS:   Increased nutrient needs related to acute illness as evidenced by estimated needs.   GOAL:   Patient will meet greater than or equal to 90% of their needs  MONITOR:   PO intake, Supplement acceptance, Weight trends, Labs  REASON FOR ASSESSMENT:   Rounds (CRRT)    ASSESSMENT:   35 yo male admitted with acute respiratory failure with pulmonary edema with acute decompensated heart failure, cardiogenic shock. Pt requiring CRRT for volume removal. PMH includes biventricular heart failure, morbid obesity, HTN, GERD, OSA, pre-DM  7/20 Admit 7/22 ECHO with LVEF <20% 7/24 CRRT initiated for volume removal   Remains on CRRT, UF 150 ml/hr Very good UOP 7 L in 24 hours  Recorded po intake 40-100% of meals  Current wt 218.4 kg; admit weight 215.5 kg. Noted weight around 206-207 kg in June  If remains on CRRT, pt will likely require phosphorus supplementation Phosphorus trending down-5.8->2.5  Labs: sodium 131 (improved from 126), phosphorus trending down-currently 2.5 Meds: reviewed  Diet Order:   Diet Order             Diet Heart Room service appropriate? Yes; Fluid consistency: Thin; Fluid restriction: 1800 mL Fluid  Diet effective now                   EDUCATION NEEDS:   Education needs have been addressed  Skin:  Skin Assessment: Reviewed RN Assessment  Last BM:  7/24  Height:   Ht Readings from Last 1 Encounters:  02/12/21 '5\' 11"'$  (1.803 m)    Weight:   Wt Readings from Last 1 Encounters:  02/17/21 (!) 218.4 kg     BMI:  Body mass index is 67.16 kg/m.  Estimated Nutritional Needs:   Kcal:  2300-3500 kcals  Protein:  150-170 g  Fluid:  </= 1.8 L  fluid restriction per MD   Kerman Passey MS, RDN, LDN, CNSC Registered Dietitian III Clinical Nutrition RD Pager and On-Call Pager Number Located in [place]

## 2021-02-17 NOTE — Progress Notes (Signed)
[name] Progress Note   35 y.o. year-old w/ hx of severe LV/ RV heart failure, super morbid obesity, HTN, OSA presented on 7/20 w/ SOB x 1 week, c/w CHF ->  IV lasix started and pt admitted. Atrial w/ RVR may have been cause of decomp CHF per cardiology team.  ECHO showed EF <20%. BP's dropped and pt moved to ICU for suspected cardiogenic shock. Started on IV dobutamine and norepi gtt's. CRRT started 7/24.  Assessment/ Plan:   AKI - creat 1.2 on admit, up to 3.3 in setting on cardiogenic shock, long-standing hx of severe bivent CHF, now w/ aflutter/ RVR.  Vol overload refractory to inotropes, pressors and IV lasix gtt ->  CRRT 7/24 ->    Seen on CRRT RIJ temp 400/200/1800 pre/post/qd Net UF 100-150 currently on levo, dobutamine and amio  Filter will need to be changed today  He's markedly up on his EDW (402 lbs earlier in 2022); continue on CRRT with max UF as tolerated -> 218 kg today.  CVP still > 20; pt tolerating CVVHD.  Vol overload - diffuse pulm edema by CXR, on bipap, prob ascites on exam, ^^CVP RV/ LV heart failure - per cardiology / CHF team -> waiting to optimize fluid status (markedly overloaded) prior to attempting DCCV. UTI on Ceftriaxone Morbid obesity. DM  Subjective:   Aflutter in 80's on amio, dobutamine + levophed but now off  lasix gtt, CVP >20 with good UOP Wt 486 lbs -> 479.   Objective:   BP (!) 107/58 (BP Location: Left Wrist)   Pulse 100   Temp 97.6 F (36.4 C)   Resp (!) 25   Ht '5\' 11"'$  (1.803 m)   Wt (!) 218.4 kg   SpO2 98%   BMI 67.16 kg/m   Intake/Output Summary (Last 24 hours) at 02/17/2021 1048 Last data filed at 02/17/2021 1000 Gross per 24 hour  Intake 3030.98 ml  Output 6681 ml  Net -3650.02 ml   Weight change: -2.393 kg  Physical Exam: Gen alert, no distress No rash, cyanosis or gangrene Sclera anicteric, throat clear No jvd or bruits Chest clear bilat to bases, no rales/ wheezing RRR no MRG Abd soft ntnd no  mass or ascites +bs GU normal MS no joint effusions or deformity Ext 2+ UE > LE and abd pannus edema Neuro is groggy , on bipap/ cpap mask  Imaging: DG Chest 1 View  Result Date: 02/15/2021 CLINICAL DATA:  Central line placement.  History of CHF. EXAM: CHEST  1 VIEW COMPARISON:  February 13, 2021 FINDINGS: A new right central line terminates in the central SVC. No pneumothorax. Cardiomegaly and probable edema remain on this limited study. No other interval changes. IMPRESSION: 1. The new right central line is in good position terminating in the central SVC without pneumothorax. 2. Cardiomegaly and pulmonary edema. Electronically Signed   By: Dorise Bullion III M.D   On: 02/15/2021 15:43    Labs: BMET Recent Labs  Lab 02/13/21 1600 02/14/21 0440 02/15/21 0530 02/15/21 1600 02/16/21 0428 02/16/21 1630 02/17/21 0456  NA 130* 128* 129* 126* 127* 130* 131*  K 3.5 4.1 3.6 4.2 3.7 4.2 4.6  CL 92* 90* 93* 91* 93* 95* 97*  CO2 '27 26 23 27 26 28 27  '$ GLUCOSE 257* 212* 146* 126* 225* 118* 138*  BUN 23* 24* 28* 31* 25* 19 17  CREATININE 2.42* 3.24* 3.32* 3.45* 2.56* 1.96* 1.56*  CALCIUM 8.0* 8.3* 8.0* 8.1* 7.8* 8.1* 8.5*  PHOS  --   --   --  5.8* 4.4 3.2 2.5   CBC Recent Labs  Lab 02/11/21 1208 02/14/21 0440 02/15/21 0530 02/16/21 0428 02/17/21 0456  WBC 10.3 9.9 8.1 7.1 7.9  NEUTROABS 8.1*  --   --   --   --   HGB 13.5 11.0* 11.5* 10.8* 11.7*  HCT 42.9 34.4* 36.1* 34.0* 37.3*  MCV 93.1 93.7 93.0 93.4 93.7  PLT 343 271 274 241 249    Medications:     apixaban  5 mg Oral BID   chlorhexidine  15 mL Mouth Rinse BID   Chlorhexidine Gluconate Cloth  6 each Topical Daily   mouth rinse  15 mL Mouth Rinse q12n4p   sodium chloride flush  10-40 mL Intracatheter Q12H   sodium chloride flush  3 mL Intravenous Q12H      [name], MD 02/17/2021, 10:48 AM

## 2021-02-17 NOTE — Progress Notes (Addendum)
Patient ID: Phillip Young, male   DOB: 1986-06-06, 35 y.o.   MRN: [id number]     Advanced Heart Failure Rounding Note  PCP-Cardiologist: None   Subjective:    Remains in AFL, HR 90s- low 100s. On amio gtt at 30/hr.   On DBA 5+ NE 3. Co-ox 55%.  On CRRT for volume removal, current pull rate 150cc/hr   5L pulled yesterday + 1.6L in UOP. Down an additional 5 lb. CVP still > 20   SCr improving, 2.56>>1.96>>1.56 Na 127>>130>>131   Resting comfortably in bed w/ BiPAP. Didn't sleep well last night. Denies CP.   UA concerning for UTI. On ceftriaxone.   Echo: EF < 20%, severe LV dilation, RV not well visualized.   Objective:   Weight Range: (!) 218.4 kg Body mass index is 67.16 kg/m.   Vital Signs:   Temp:  [97.1 F (36.2 C)-97.9 F (36.6 C)] 97.1 F (36.2 C) (07/26 0000) Pulse Rate:  [82-110] 98 (07/26 0636) Resp:  [8-31] 8 (07/26 0636) BP: (87-135)/(52-117) 108/54 (07/26 0619) SpO2:  [85 %-100 %] 100 % (07/26 0636) Weight:  [218.4 kg] 218.4 kg (07/26 0606) Last BM Date: 02/15/21  Weight change: Filed Weights   02/15/21 0505 02/16/21 0500 02/17/21 0606  Weight: (!) 222.5 kg (!) 220.8 kg (!) 218.4 kg    Intake/Output:   Intake/Output Summary (Last 24 hours) at 02/17/2021 0710 Last data filed at 02/17/2021 0600 Gross per 24 hour  Intake 3054.7 ml  Output 6607 ml  Net -3552.3 ml      Physical Exam    CVP > 20  General:  super morbidly obese.  Sleeping w/  bipap. No respiratory difficulty HEENT: normal Neck: supple. Thick neck JVD not well visualized. Carotids 2+ bilat; no bruits. No lymphadenopathy or thyromegaly appreciated. Cor: PMI nondisplaced. Irregular irregular rhythm and tach rate. No rubs, gallops or murmurs. Lungs: decreased BS anteriorly  Abdomen: obese soft, nontender, nondistended. No hepatosplenomegaly. No bruits or masses. Good bowel sounds. Extremities: no cyanosis, clubbing, rash, 1+ bilateral LE edema Neuro: alert & oriented x 3, cranial  nerves grossly intact. moves all 4 extremities w/o difficulty. Affect pleasant.    Telemetry   Atrial flutter rate around 90s-low 100s (personally reviewed)  Labs    CBC Recent Labs    02/16/21 0428 02/17/21 0456  WBC 7.1 7.9  HGB 10.8* 11.7*  HCT 34.0* 37.3*  MCV 93.4 93.7  PLT 241 0000000   Basic Metabolic Panel Recent Labs    02/16/21 0428 02/16/21 1630 02/17/21 0456  NA 127* 130* 131*  K 3.7 4.2 4.6  CL 93* 95* 97*  CO2 '26 28 27  '$ GLUCOSE 225* 118* 138*  BUN 25* 19 17  CREATININE 2.56* 1.96* 1.56*  CALCIUM 7.8* 8.1* 8.5*  MG 2.3  --  2.4  PHOS 4.4 3.2 2.5   Liver Function Tests Recent Labs    02/16/21 1630 02/17/21 0456  ALBUMIN 3.1* 3.3*    No results for input(s): LIPASE, AMYLASE in the last 72 hours. Cardiac Enzymes No results for input(s): CKTOTAL, CKMB, CKMBINDEX, TROPONINI in the last 72 hours.  BNP: BNP (last 3 results) Recent Labs    10/10/20 1336 11/07/20 0932 02/11/21 1500  BNP 126.9* 106.0* 194.3*    ProBNP (last 3 results) No results for input(s): PROBNP in the last 8760 hours.   D-Dimer No results for input(s): DDIMER in the last 72 hours.  Hemoglobin A1C No results for input(s): HGBA1C in the last 72 hours.  Fasting  Lipid Panel No results for input(s): CHOL, HDL, LDLCALC, TRIG, CHOLHDL, LDLDIRECT in the last 72 hours. Thyroid Function Tests No results for input(s): TSH, T4TOTAL, T3FREE, THYROIDAB in the last 72 hours.  Invalid input(s): FREET3   Other results:   Imaging    No results found.   Medications:     Scheduled Medications:  apixaban  5 mg Oral BID   chlorhexidine  15 mL Mouth Rinse BID   Chlorhexidine Gluconate Cloth  6 each Topical Daily   mouth rinse  15 mL Mouth Rinse q12n4p   sodium chloride flush  10-40 mL Intracatheter Q12H   sodium chloride flush  3 mL Intravenous Q12H    Infusions:   prismasol BGK 4/2.5 400 mL/hr at 02/16/21 2202    prismasol BGK 4/2.5 200 mL/hr at 02/16/21 1929    sodium chloride     sodium chloride Stopped (02/16/21 2155)   amiodarone 30 mg/hr (02/17/21 0600)   DOBUTamine 5 mcg/kg/min (02/17/21 0600)   norepinephrine (LEVOPHED) Adult infusion 3 mcg/min (02/17/21 0615)   prismasol BGK 4/2.5 1,800 mL/hr at 02/17/21 0431    PRN Medications: sodium chloride, acetaminophen **OR** acetaminophen, alteplase, heparin, sodium chloride, sodium chloride flush, sodium chloride flush  Assessment/Plan   1. Acute on Chronic Biventricular Heart Failure:  Known cardiomyopathy since 2019, previously followed by [facility].  Echo 02/27/2020 with EF < 20%, moderate LV dilation, severely decreased RV function with severe RV dilation, severe biatrial enlargement, mild MR. No history of ETOH/drugs.  No FH of cardiomyopathy (mother with [place]).  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  He is too large for cardiac MRI. RHC/LHC on 03/04/20 with no CAD (nonischemic cardiomyopathy), elevated left and right heart filling pressures, and low CI at 2.1. 05/2020 Echo showed EF 20-25%. Evaluated by EP but not a candidate for ICD given BMI.  Echo this admission with EF < 20%, RV poorly visualized. He now has CHF exacerbation likely in setting of AFL with RVR.  He was hypotensive with poor UOP, concern for cardiogenic shock.  Dobutamine therefore started at 2.5 and increased to 5, NE added at 3 currently.  This morning, co-ox 56% and CVP still >20.  With progressive renal failure, CVVH started.   - Continue dobutamine 5 and NE 3, can place arterial line if needed. - Now on CVVH, can aim to pull 100-150 cc/hr net negative UF today as BP tolerates.  - Fluid restrict <1,800 ml/ day - With AKI/hypotension, stopped Entresto, dapagliflozin, spironolactone, digoxin.  - Need attempt at DCCV to restore NSR, once volume better.  - With marked obesity, he is not a candidate for advanced therapies.  2. Aflutter w/ RVR: Noted to be in Afib prior admit, 3/22,  in setting of CAP and a/c CHF but converted to NSR on amiodarone. Discharged home w/ eliquis. In 2:1 AFL this admit in setting of a/c CHF w/ marked volume overload, + recent gastroenteritis w/ diarrhea. Has OSA but not using CPAP. TSH WNL. Admits to intermittent compliance w/ Eliquis. Has missed several doses in the last month.  Now on amiodarone gtt 30 mg/hr with HR 90s.  - Continue amiodarone at 30 mg/hr.  - Continue Eliquis 5 mg bid.  - Plan TEE/DCCV this admit, will need more diuresis first. 3. Acute Hypoxic Respiratory Failure: suspect underlying OHS/OSA exacerbated by CHF this admission.  - Continue oxygen + CPAP.  - Diurese.  4. Type 2 DM: Farxiga held with AKI.  5. OSA: Has not been  on Bipap at home.  6. Super Morbid Obesity: Body mass index is 66.26 kg/m. - wt loss advised 7. Tooth Abscess: Has been on amoxicillin but changing to ceftriaxone with ?UTI.  8. UTI: Suspect by UA, sent urine culture.  ?Urinary obstruction related to this => no hydronephrosis on renal US.  - Ceftriaxone IV 9. Elevated Hs Trop: Suspect demand ischemia in setting of CHF + rapid AFL. Cath 8/21 showed no CAD.  10. AKI: Creatinine peaked to 3.32 though BUN not markedly high.  Concern for cardiorenal syndrome.  Renal US with no hydronephrosis. With poor UOP and rising creatinine + marked volume overload, he was started on CVVH.  - Continue CVVH, pulling 100-150 cc/hr.    Length of Stay: [address], PA-C  02/17/2021, 7:10 AM  Advanced Heart Failure Team Pager [phone] (M-F; 7a - 5p)  Please contact [facility] for night-coverage after hours (5p -7a ) and weekends on [email or web address]  Patient seen with PA, agree with the above note.   Weight down 5 lbs, UF 100-150 cc/hr and also with 1680 cc UOP.  He remains on dobutamine 5 and NE 3 with CVP > 20.   General: NAD, obese.  Neck: Thick. JVP 16+, no thyromegaly or thyroid nodule.  Lungs: Clear to auscultation bilaterally with normal respiratory  effort. CV: Nonpalpable.  Heart regular S1/S2, no S3/S4, no murmur. 1+ edema to knees.  No carotid bruit.  Normal pedal pulses.  Abdomen: Soft, nontender, no hepatosplenomegaly, no distention.  Skin: Intact without lesions or rashes.  Neurologic: Alert and oriented x 3.  Psych: Normal affect. Extremities: No clubbing or cyanosis.  HEENT: Normal.   Patient remains markedly volume overloaded.  Continue CVVH 100-150 cc/hr, continue current dobutamine/NE.    He remains in atrial flutter on apixaban and amiodarone.  When volume status better-controlled, will need TEE-guided DCCV.   CRITICAL CARE Performed by: [name]  Total critical care time: 35 minutes  Critical care time was exclusive of separately billable procedures and treating other patients.  Critical care was necessary to treat or prevent imminent or life-threatening deterioration.  Critical care was time spent personally by me on the following activities: development of treatment plan with patient and/or surrogate as well as nursing, discussions with consultants, evaluation of patient's response to treatment, examination of patient, obtaining history from patient or surrogate, ordering and performing treatments and interventions, ordering and review of laboratory studies, ordering and review of radiographic studies, pulse oximetry and re-evaluation of patient's condition.  [name] 02/17/2021 7:35 AM

## 2021-02-17 NOTE — Progress Notes (Addendum)
NAME:  Phillip Young, MRN:  [id number], DOB:  September 28, 1985, LOS: 6 ADMISSION DATE:  02/11/2021, CONSULTATION DATE:  02/15/21 REFERRING MD:  Harvin Hazel HF  -- [name] , CHIEF COMPLAINT:  decompensated heart failure, renal failure    History of Present Illness:  35 yo M PMH class V obesity, biventricular heart failure, HTN, OSA presented to ED 7/20 with CC SOB, palpitations x 1 week. Associated dry cough, intermittent hypoxia, edema. Admitted to [facility] for management of acute on chronic heart failure, rapid aflutter. Got '80mg'$  IV lasix, amio. 7/21 Adv HF consulted for further assistance. 7/24 patient in the CVICU on a lasix infusion as well as dobutamine without robust response to diuresis (notably patient is refusing foley catheter placement). He was started on norepi 7/24 with persistent soft Bps amd worsening AKI, volume overload.   Critical care consulted in this setting  Pertinent  Medical History  Biventricular heart failure OSA Class V obesity GERD HTN DM2  Significant Hospital Events: Including procedures, antibiotic start and stop dates in addition to other pertinent events   7/20 admitted to [facility] for Afl RVR, acute on chronic BiV HF, hypoxia. Amio, push dose lasix  7/21 adv HF consult, started dobutamine, lasix gtt  7/22 ECHO with LVEF <20% (worse than prior). Hard to assess RV with technically difficult study  7/24 adding NE. CCM consult. Worse renal function, on lasix gtt but teetering need for CRRT  7/25 CRRT pulling 150/hr  Interim History / Subjective:  No complaints. Making good urine. 7L out yesterday. CRRT ongoing. suboptimal CPAP compliance.   Objective   Blood pressure 96/82, pulse (!) 108, temperature (!) 97.1 F (36.2 C), temperature source Axillary, resp. rate 15, height '5\' 11"'$  (1.803 m), weight (!) 218.4 kg, SpO2 97 %. CVP:  [25 mmHg-26 mmHg] 26 mmHg      Intake/Output Summary (Last 24 hours) at 02/17/2021 0804 Last data filed at 02/17/2021 0700 Gross per 24 hour  Intake  2643.25 ml  Output 6145 ml  Net -3501.75 ml    Filed Weights   02/15/21 0505 02/16/21 0500 02/17/21 0606  Weight: (!) 222.5 kg (!) 220.8 kg (!) 218.4 kg    Examination: General:  Middle aged appearing male morbid obesity.  Neuro:  Alert, oriented, non-focal HEENT:  [place]/AT, JVP elevated, PERRL Cardiovascular:  Irregular, no MRG Lungs:  Clear bilateral breath sounds Abdomen:  Soft, non-distended, non-tender.  Musculoskeletal:  No acute deformity or ROM limitation Skin:  Intact, MMM  Co-ox 56  Resolved Hospital Problem list     Assessment & Plan:  Acute decompensated heart failure with pulmonary edema- about 60 lbs above dry weight on presentation.  Cardiogenic shock - Appreciate advanced heart failure - Norepi 36mg, Dobutamine 5107m - CRRT for volume removal.  - MAP goal 65.   Atrial flutter w RVR - Amio infusion - Eliquis - Needs cardioversion at some point pending clinical improvement.   Acute hypoxemic respiratory failure: on nocturnal oxygen at home. Sat 88% on room air in ED.  Pulmonary edema.  OSA/OHS with poor home BiPAP compliance due to financial limitations.  - Continue supplemental O2 to keep SpO2 88-95% - CRRT for volume removal.  - Nocturnal CPAP while inpatient with suboptimal compliance. Prescribed BiPAP at home but has been unable to obtain due to financial limitations.  - May be a candidate for Trilogy which can sometimes be easier to obtain. Can figure this out when he is closer to discharge.   Cardiorenal syndrome: creatinine trending down. 7L UOP last  24 hours.  - CRRT - Appreciate nephrology   Best Practice (right click and "Reselect all SmartList Selections" daily)   Diet/type: diabetic DVT prophylaxis: DOAC GI prophylaxis: N/A Lines: yes and it is still needed  Foley:  N/A, refuses Code Status:  full code Last date of multidisciplinary goals of care discussion [pending] Dispo: CVICU   Critical care time 42 mins required due to  cardiogenic shock, acute renal failure on CRRT, acute hypoxemic respiratory failure.   Georgann Housekeeper, AGACNP-BC [facility]  See Amion for personal pager PCCM on call pager [phone] until 7pm. Please call Elink 7p-7a. [phone]  02/17/2021 8:17 AM   Attending Note Seen for cardiorenal syndrome and cardiogenic shock In good spirits, sleeping during day, awake at night Remains volume overloaded on exam Wearing CPAP PRN Continue fluid removal with CRRT, hopefully temporary Wean pressors Continue inotropes Needs another 50 lbs off  32 min cc time Erskine Emery MD PCCM

## 2021-02-18 ENCOUNTER — Inpatient Hospital Stay: Payer: BC Managed Care – PPO | Admitting: Student

## 2021-02-18 DIAGNOSIS — I5023 Acute on chronic systolic (congestive) heart failure: Secondary | ICD-10-CM | POA: Diagnosis not present

## 2021-02-18 LAB — RENAL FUNCTION PANEL
Albumin: 3.3 g/dL — ABNORMAL LOW (ref 3.5–5.0)
Albumin: 3.4 g/dL — ABNORMAL LOW (ref 3.5–5.0)
Anion gap: 6 (ref 5–15)
Anion gap: 7 (ref 5–15)
BUN: 14 mg/dL (ref 6–20)
BUN: 12 mg/dL (ref 6–20)
CO2: 30 mmol/L (ref 22–32)
CO2: 28 mmol/L (ref 22–32)
Calcium: 8.9 mg/dL (ref 8.9–10.3)
Calcium: 8.9 mg/dL (ref 8.9–10.3)
Chloride: 97 mmol/L — ABNORMAL LOW (ref 98–111)
Chloride: 96 mmol/L — ABNORMAL LOW (ref 98–111)
Creatinine, Ser: 1.18 mg/dL (ref 0.61–1.24)
Creatinine, Ser: 1.1 mg/dL (ref 0.61–1.24)
GFR, Estimated: >60 mL/min (ref >60)
GFR, Estimated: >60 mL/min (ref >60)
Glucose, Bld: 115 mg/dL — ABNORMAL HIGH (ref 70–99)
Glucose, Bld: 215 mg/dL — ABNORMAL HIGH (ref 70–99)
Phosphorus: 2.4 mg/dL — ABNORMAL LOW (ref 2.5–4.6)
Phosphorus: 2 mg/dL — ABNORMAL LOW (ref 2.5–4.6)
Potassium: 4.5 mmol/L (ref 3.5–5.1)
Potassium: 4.2 mmol/L (ref 3.5–5.1)
Sodium: 133 mmol/L — ABNORMAL LOW (ref 135–145)
Sodium: 131 mmol/L — ABNORMAL LOW (ref 135–145)

## 2021-02-18 LAB — CBC
HCT: 36 % — ABNORMAL LOW (ref 39.0–52.0)
Hemoglobin: 11.5 g/dL — ABNORMAL LOW (ref 13.0–17.0)
MCH: 29.9 pg (ref 26.0–34.0)
MCHC: 31.9 g/dL (ref 30.0–36.0)
MCV: 93.5 fL (ref 80.0–100.0)
Platelets: 234 10*3/uL (ref 150–400)
RBC: 3.85 MIL/uL — ABNORMAL LOW (ref 4.22–5.81)
RDW: 14.7 % (ref 11.5–15.5)
WBC: 7.5 10*3/uL (ref 4.0–10.5)
nRBC: 0 % (ref 0.0–0.2)

## 2021-02-18 LAB — COOXEMETRY PANEL
Carboxyhemoglobin: 1.5 % (ref 0.5–1.5)
Methemoglobin: 0.7 % (ref 0.0–1.5)
O2 Saturation: 68.1 %
Total hemoglobin: 11.6 g/dL — ABNORMAL LOW (ref 12.0–16.0)

## 2021-02-18 LAB — APTT: aPTT: 33 seconds (ref 24–36)

## 2021-02-18 LAB — MAGNESIUM: Magnesium: 2.5 mg/dL — ABNORMAL HIGH (ref 1.7–2.4)

## 2021-02-18 MED ORDER — MAGNESIUM HYDROXIDE 400 MG/5ML PO SUSP: 30.0000 mL | Freq: Every day | ORAL | Status: DC | PRN

## 2021-02-18 MED ORDER — ALUM & MAG HYDROXIDE-SIMETH 200-200-20 MG/5ML PO SUSP
30.0000 mL | ORAL | Status: DC | PRN
  Administered 2021-02-23 – 2021-02-24 (×2): 30 mL via ORAL
  Filled 2021-02-18 (×3): qty 30

## 2021-02-18 MED ORDER — DIPHENHYDRAMINE HCL 25 MG PO CAPS
25.0000 mg | ORAL_CAPSULE | Freq: Four times a day (QID) | ORAL | Status: DC | PRN
  Administered 2021-02-19 – 2021-03-22 (×13): 25 mg via ORAL
  Filled 2021-02-18 (×13): qty 1

## 2021-02-18 MED ORDER — GUAIFENESIN-DM 100-10 MG/5ML PO SYRP
15.0000 mL | ORAL_SOLUTION | ORAL | Status: DC | PRN
  Administered 2021-02-18 – 2021-03-22 (×18): 15 mL via ORAL
  Filled 2021-02-18 (×22): qty 15

## 2021-02-18 NOTE — Progress Notes (Signed)
CHF team to take over as primary PCCM available PRN

## 2021-02-18 NOTE — Progress Notes (Addendum)
Patient ID: Phillip Young, male   DOB: [birth year], 35 y.o.   MRN: [id number]     Advanced Heart Failure Rounding Note  PCP-Cardiologist: None   Subjective:    Still in AFL, V-rates 110s.    On DBA 5+ NE 3. Co-ox 68%.  Wt down 13 lb from yesterday.  5.7L off w/ CRRT + 2L in UOP. CVP coming down, 19 today.   SCr improving, 2.56>>1.96>>1.56>>1.18 Na 127>>130>>131>>133 K 4.5   Mg 2.5  Feels better. No complaints. Out of bed and just finished breakfast.    Echo: EF < 20%, severe LV dilation, RV not well visualized.   Objective:   Weight Range: (!) 212.4 kg Body mass index is 65.31 kg/m.   Vital Signs:   Temp:  [96 F (35.6 C)-96.8 F (36 C)] 96.8 F (36 C) (07/27 0650) Pulse Rate:  [63-221] 115 (07/27 0650) Resp:  [13-38] 22 (07/27 0650) BP: (59-136)/(43-96) 106/50 (07/27 0600) SpO2:  [82 %-100 %] 95 % (07/27 0650) Weight:  [212.4 kg] 212.4 kg (07/27 0630) Last BM Date: 02/15/21  Weight change: Filed Weights   02/16/21 0500 02/17/21 0606 02/18/21 0630  Weight: (!) 220.8 kg (!) 218.4 kg (!) 212.4 kg    Intake/Output:   Intake/Output Summary (Last 24 hours) at 02/18/2021 0709 Last data filed at 02/18/2021 0700 Gross per 24 hour  Intake 3476.13 ml  Output 7790 ml  Net -4313.87 ml      Physical Exam   CVP 19  General:  morbidly obese young male, sitting up in chair. No respiratory difficulty HEENT: normal Neck: supple. Thick neck, JVD to jaw + Rt IJ HD cath Carotids 2+ bilat; no bruits. No lymphadenopathy or thyromegaly appreciated. Cor: PMI nondisplaced. Irregular rhythm, tachy rate No rubs, gallops or murmurs. Lungs: clear Abdomen: soft, nontender, nondistended. No hepatosplenomegaly. No bruits or masses. Good bowel sounds. Extremities: legs obese, no pitting edema, no cyanosis, clubbing, rash, + RUE PICC Neuro: alert & oriented x 3, cranial nerves grossly intact. moves all 4 extremities w/o difficulty. Affect pleasant.  Telemetry   Atrial flutter 110s  (personally reviewed)  Labs    CBC Recent Labs    02/17/21 0456 02/18/21 0447  WBC 7.9 7.5  HGB 11.7* 11.5*  HCT 37.3* 36.0*  MCV 93.7 93.5  PLT 249 [id number]   Basic Metabolic Panel Recent Labs    02/17/21 0456 02/17/21 1629 02/18/21 0447  NA 131* 131* 133*  K 4.6 4.5 4.5  CL 97* 97* 97*  CO2 '27 28 30  '$ GLUCOSE 138* 114* 115*  BUN '17 16 14  '$ CREATININE 1.56* 1.37* 1.18  CALCIUM 8.5* 8.4* 8.9  MG 2.4  --  2.5*  PHOS 2.5 2.3* 2.4*   Liver Function Tests Recent Labs    02/17/21 1629 02/18/21 0447  ALBUMIN 3.3* 3.3*    No results for input(s): LIPASE, AMYLASE in the last 72 hours. Cardiac Enzymes No results for input(s): CKTOTAL, CKMB, CKMBINDEX, TROPONINI in the last 72 hours.  BNP: BNP (last 3 results) Recent Labs    10/10/20 1336 11/07/20 0932 02/11/21 1500  BNP 126.9* 106.0* 194.3*    ProBNP (last 3 results) No results for input(s): PROBNP in the last 8760 hours.   D-Dimer No results for input(s): DDIMER in the last 72 hours.  Hemoglobin A1C No results for input(s): HGBA1C in the last 72 hours.  Fasting Lipid Panel No results for input(s): CHOL, HDL, LDLCALC, TRIG, CHOLHDL, LDLDIRECT in the last 72 hours. Thyroid Function Tests No results  for input(s): TSH, T4TOTAL, T3FREE, THYROIDAB in the last 72 hours.  Invalid input(s): FREET3   Other results:   Imaging    No results found.   Medications:     Scheduled Medications:  (feeding supplement) PROSource Plus  30 mL Oral TID BM   apixaban  5 mg Oral BID   chlorhexidine  15 mL Mouth Rinse BID   Chlorhexidine Gluconate Cloth  6 each Topical Daily   feeding supplement  237 mL Oral BID BM   mouth rinse  15 mL Mouth Rinse q12n4p   multivitamin  1 tablet Oral QHS   sodium chloride flush  10-40 mL Intracatheter Q12H   sodium chloride flush  3 mL Intravenous Q12H    Infusions:   prismasol BGK 4/2.5 400 mL/hr at 02/18/21 0010    prismasol BGK 4/2.5 200 mL/hr at 02/17/21 2136   sodium  chloride     sodium chloride Stopped (02/16/21 2155)   amiodarone 30 mg/hr (02/18/21 0600)   DOBUTamine 5 mcg/kg/min (02/18/21 0600)   norepinephrine (LEVOPHED) Adult infusion 3 mcg/min (02/18/21 0600)   prismasol BGK 4/2.5 1,800 mL/hr at 02/18/21 0702    PRN Medications: sodium chloride, acetaminophen **OR** acetaminophen, alteplase, diphenhydrAMINE, heparin, sodium chloride, sodium chloride flush, sodium chloride flush  Assessment/Plan   1. Acute on Chronic Biventricular Heart Failure:  Known cardiomyopathy since 2019, previously followed by [facility].  Echo 02/27/2020 with EF < 20%, moderate LV dilation, severely decreased RV function with severe RV dilation, severe biatrial enlargement, mild MR. No history of ETOH/drugs.  No FH of cardiomyopathy (mother with [place]).  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  He is too large for cardiac MRI. RHC/LHC on 03/04/20 with no CAD (nonischemic cardiomyopathy), elevated left and right heart filling pressures, and low CI at 2.1. 05/2020 Echo showed EF 20-25%. Evaluated by EP but not a candidate for ICD given BMI.  Echo this admission with EF < 20%, RV poorly visualized. He now has CHF exacerbation likely in setting of AFL with RVR.  He was hypotensive with poor UOP, concern for cardiogenic shock.  Dobutamine therefore started at 2.5 and increased to 5, NE added at 3 currently.  With progressive renal failure, CVVH started.  This morning, co-ox 68% and CVP trending down at 19.   - Continue dobutamine 5 and NE 3, can place arterial line if needed. - Continue CVVH for fluid offloading  - Fluid restrict <1,800 ml/ day - With AKI/hypotension, stopped Entresto, dapagliflozin, spironolactone, digoxin.  - Need attempt at DCCV to restore NSR, once volume better.  - With marked obesity, he is not a candidate for advanced therapies.  2. Aflutter w/ RVR: Noted to be in Afib prior admit, 3/22, in setting of CAP and a/c  CHF but converted to NSR on amiodarone. Discharged home w/ eliquis. In 2:1 AFL this admit in setting of a/c CHF w/ marked volume overload, + recent gastroenteritis w/ diarrhea. Has OSA but not using CPAP. TSH WNL. Admits to intermittent compliance w/ Eliquis. Has missed several doses in the last month.  Now on amiodarone gtt 30 mg/hr with HR 110s.  - Continue amiodarone at 30 mg/hr.  - Continue Eliquis 5 mg bid.  - Plan TEE/DCCV this admit, will need more diuresis first. 3. Acute Hypoxic Respiratory Failure: suspect underlying OHS/OSA exacerbated by CHF this admission.  - Continue oxygen + CPAP.  - Diurese.  4. Type 2 DM: Farxiga held with AKI.  5. OSA: Has not been  on Bipap at home.  6. Super Morbid Obesity: Body mass index is 66.26 kg/m. - wt loss advised 7. Tooth Abscess: Has been on amoxicillin but changing to ceftriaxone with ?UTI.  8. UTI: Suspect by UA, sent urine culture.  ?Urinary obstruction related to this => no hydronephrosis on renal US.  - Ceftriaxone IV 9. Elevated Hs Trop: Suspect demand ischemia in setting of CHF + rapid AFL. Cath 8/21 showed no CAD.  10. AKI: Creatinine peaked to 3.32 though BUN not markedly high.  Concern for cardiorenal syndrome.  Renal US with no hydronephrosis. With poor UOP and rising creatinine + marked volume overload, he was started on CVVH.  - now resolved, SCr down to 1.18 today  - Continue CVVH for fluid offloading    Length of Stay: [address], PA-C  02/18/2021, 7:09 AM  Advanced Heart Failure Team Pager [phone] (M-F; 7a - 5p)  Please contact [facility] for night-coverage after hours (5p -7a ) and weekends on [email or web address]  Patient seen with PA agree with the above note.   Weight coming down.  CVP 19 this morning, co-ox 68%.  Remains on dobutamine 5 and NE 3. UOP 2080.   General: NAD, obese.  Neck: No JVD, no thyromegaly or thyroid nodule.  Lungs: Clear to auscultation bilaterally with normal respiratory effort. CV:  Nonpalpable PMI.  Heart irregular S1/S2, no S3/S4, no murmur.  1+ edema to thighs.  Abdomen: Soft, nontender, no hepatosplenomegaly, no distention.  Skin: Intact without lesions or rashes.  Neurologic: Alert and oriented x 3.  Psych: Normal affect. Extremities: No clubbing or cyanosis.  HEENT: Normal.   Patient remains markedly volume overloaded but improving with CVP down to 19.  Continue CVVH 100-150 cc/hr, continue current dobutamine/NE.  He is making urine as well, hopefully will have renal recovery when we stop CVVH eventually.    He remains in atrial flutter on apixaban and amiodarone.  When volume status better-controlled, will need TEE-guided DCCV.  CRITICAL CARE Performed by: [name]  Total critical care time: 35 minutes  Critical care time was exclusive of separately billable procedures and treating other patients.  Critical care was necessary to treat or prevent imminent or life-threatening deterioration.  Critical care was time spent personally by me on the following activities: development of treatment plan with patient and/or surrogate as well as nursing, discussions with consultants, evaluation of patient's response to treatment, examination of patient, obtaining history from patient or surrogate, ordering and performing treatments and interventions, ordering and review of laboratory studies, ordering and review of radiographic studies, pulse oximetry and re-evaluation of patient's condition.  [name] 02/18/2021 7:36 AM

## 2021-02-18 NOTE — Progress Notes (Signed)
[facility] Progress Note   35 y.o. year-old w/ hx of severe LV/ RV heart failure, super morbid obesity, HTN, OSA presented on 7/20 w/ SOB x 1 week, c/w CHF ->  IV lasix started and pt admitted. Atrial w/ RVR may have been cause of decomp CHF per cardiology team.  ECHO showed EF <20%. BP's dropped and pt moved to ICU for suspected cardiogenic shock. Started on IV dobutamine and norepi gtt's. CRRT started 7/24.  Assessment/ Plan:   AKI - creat 1.2 on admit, up to 3.3 in setting on cardiogenic shock, long-standing hx of severe bivent CHF, now w/ aflutter/ RVR.  Vol overload refractory to inotropes, pressors and IV lasix gtt ->  CRRT 7/24 ->    Seen on CRRT RIJ temp 400/200/1800 pre/post/qd Net UF 100-150 currently on levo, dobutamine and amio  He's markedly up on his EDW (402 lbs earlier in 2022); continue on CRRT with max UF as tolerated -> 468 lbs today.  CVP finally below  20; pt tolerating CVVHD.  Vol overload - diffuse pulm edema by CXR, on bipap, prob ascites on exam, ^^CVP RV/ LV heart failure - per cardiology / CHF team -> waiting to optimize fluid status (markedly overloaded) prior to attempting DCCV. UTI on Ceftriaxone Morbid obesity. DM  Subjective:   Aflutter on amio, dobutamine + levophed but now off  lasix gtt, CVP 19 and he is making urine. Out of bed this AM. Wt 486 lbs -> 479 -> 468.   Objective:   BP (!) 106/50   Pulse (!) 115   Temp 97.9 F (36.6 C) (Oral)   Resp (!) 22   Ht '5\' 11"'$  (1.803 m)   Wt (!) 212.4 kg   SpO2 95%   BMI 65.31 kg/m   Intake/Output Summary (Last 24 hours) at 02/18/2021 0943 Last data filed at 02/18/2021 [id number] Gross per 24 hour  Intake 3025.7 ml  Output 7749 ml  Net -4723.3 ml   Weight change: -6.007 kg  Physical Exam: Gen alert, no distress No rash, cyanosis or gangrene Sclera anicteric, throat clear No jvd or bruits Chest clear bilat to bases, no rales/ wheezing RRR no MRG Abd soft ntnd no mass or ascites +bs GU  normal MS no joint effusions or deformity Ext 2+ UE > LE and abd pannus edema Neuro very alert this am in chair bedside , off bipap/ cpap mask  Imaging: No results found.  Labs: BMET Recent Labs  Lab 02/15/21 0530 02/15/21 1600 02/16/21 0428 02/16/21 1630 02/17/21 0456 02/17/21 1629 02/18/21 0447  NA 129* 126* 127* 130* 131* 131* 133*  K 3.6 4.2 3.7 4.2 4.6 4.5 4.5  CL 93* 91* 93* 95* 97* 97* 97*  CO2 '23 27 26 28 27 28 30  '$ GLUCOSE 146* 126* 225* 118* 138* 114* 115*  BUN 28* 31* 25* '19 17 16 14  '$ CREATININE 3.32* 3.45* 2.56* 1.96* 1.56* 1.37* 1.18  CALCIUM 8.0* 8.1* 7.8* 8.1* 8.5* 8.4* 8.9  PHOS  --  5.8* 4.4 3.2 2.5 2.3* 2.4*   CBC Recent Labs  Lab 02/11/21 1208 02/14/21 0440 02/15/21 0530 02/16/21 0428 02/17/21 0456 02/18/21 0447  WBC 10.3   < > 8.1 7.1 7.9 7.5  NEUTROABS 8.1*  --   --   --   --   --   HGB 13.5   < > 11.5* 10.8* 11.7* 11.5*  HCT 42.9   < > 36.1* 34.0* 37.3* 36.0*  MCV 93.1   < > 93.0 93.4  93.7 93.5  PLT 343   < > 274 241 249 234   < > = values in this interval not displayed.    Medications:     (feeding supplement) PROSource Plus  30 mL Oral TID BM   apixaban  5 mg Oral BID   chlorhexidine  15 mL Mouth Rinse BID   Chlorhexidine Gluconate Cloth  6 each Topical Daily   feeding supplement  237 mL Oral BID BM   mouth rinse  15 mL Mouth Rinse q12n4p   multivitamin  1 tablet Oral QHS   sodium chloride flush  10-40 mL Intracatheter Q12H   sodium chloride flush  3 mL Intravenous Q12H      [name], MD 02/18/2021, 9:43 AM

## 2021-02-18 NOTE — Progress Notes (Signed)
[place] Progress Note Patient Name: Phillip Young DOB: [birth year] MRN: [id number]   Date of Service  02/18/2021  HPI/Events of Note  Itching - Patient request for Benadryl.   eICU Interventions  Plan: Benadryl 25 mg PO Q 6 hours PRN itching.     Intervention Category Major Interventions: Other:  Shermika Balthaser Cornelia Copa 02/18/2021, 5:14 AM

## 2021-02-19 DIAGNOSIS — I5023 Acute on chronic systolic (congestive) heart failure: Secondary | ICD-10-CM | POA: Diagnosis not present

## 2021-02-19 DIAGNOSIS — I4892 Unspecified atrial flutter: Secondary | ICD-10-CM | POA: Diagnosis not present

## 2021-02-19 LAB — MAGNESIUM: Magnesium: 2.2 mg/dL (ref 1.7–2.4)

## 2021-02-19 LAB — RENAL FUNCTION PANEL
Albumin: 3.3 g/dL — ABNORMAL LOW (ref 3.5–5.0)
Albumin: 3.4 g/dL — ABNORMAL LOW (ref 3.5–5.0)
Anion gap: 8 (ref 5–15)
Anion gap: 7 (ref 5–15)
BUN: 11 mg/dL (ref 6–20)
BUN: 10 mg/dL (ref 6–20)
CO2: 26 mmol/L (ref 22–32)
CO2: 28 mmol/L (ref 22–32)
Calcium: 8.7 mg/dL — ABNORMAL LOW (ref 8.9–10.3)
Calcium: 8.9 mg/dL (ref 8.9–10.3)
Chloride: 94 mmol/L — ABNORMAL LOW (ref 98–111)
Chloride: 98 mmol/L (ref 98–111)
Creatinine, Ser: 1.16 mg/dL (ref 0.61–1.24)
Creatinine, Ser: 1.22 mg/dL (ref 0.61–1.24)
GFR, Estimated: >60 mL/min (ref >60)
GFR, Estimated: >60 mL/min (ref >60)
Glucose, Bld: 278 mg/dL — ABNORMAL HIGH (ref 70–99)
Glucose, Bld: 158 mg/dL — ABNORMAL HIGH (ref 70–99)
Phosphorus: 2.2 mg/dL — ABNORMAL LOW (ref 2.5–4.6)
Phosphorus: 3.1 mg/dL (ref 2.5–4.6)
Potassium: 4.1 mmol/L (ref 3.5–5.1)
Potassium: 4.5 mmol/L (ref 3.5–5.1)
Sodium: 128 mmol/L — ABNORMAL LOW (ref 135–145)
Sodium: 133 mmol/L — ABNORMAL LOW (ref 135–145)

## 2021-02-19 LAB — CBC
HCT: 36.2 % — ABNORMAL LOW (ref 39.0–52.0)
Hemoglobin: 11.4 g/dL — ABNORMAL LOW (ref 13.0–17.0)
MCH: 29.5 pg (ref 26.0–34.0)
MCHC: 31.5 g/dL (ref 30.0–36.0)
MCV: 93.8 fL (ref 80.0–100.0)
Platelets: 212 10*3/uL (ref 150–400)
RBC: 3.86 MIL/uL — ABNORMAL LOW (ref 4.22–5.81)
RDW: 14.7 % (ref 11.5–15.5)
WBC: 8.6 10*3/uL (ref 4.0–10.5)
nRBC: 0 % (ref 0.0–0.2)

## 2021-02-19 LAB — COOXEMETRY PANEL
Carboxyhemoglobin: 1.7 % — ABNORMAL HIGH (ref 0.5–1.5)
Methemoglobin: 1 % (ref 0.0–1.5)
O2 Saturation: 61.7 %
Total hemoglobin: 10.6 g/dL — ABNORMAL LOW (ref 12.0–16.0)

## 2021-02-19 LAB — APTT: aPTT: 36 seconds (ref 24–36)

## 2021-02-19 MED ORDER — HEPARIN (PORCINE) 2000 UNITS/L FOR CRRT: INTRAVENOUS_CENTRAL | Status: DC | PRN

## 2021-02-19 MED ORDER — BENZONATATE 100 MG PO CAPS
100.0000 mg | ORAL_CAPSULE | Freq: Two times a day (BID) | ORAL | Status: DC
  Administered 2021-02-19 – 2021-03-01 (×22): 100 mg via ORAL
  Filled 2021-02-19 (×23): qty 1

## 2021-02-19 NOTE — Progress Notes (Addendum)
Patient ID: Phillip Young, male   DOB: 12-11-1985, 35 y.o.   MRN: [id number]     Advanced Heart Failure Rounding Note  PCP-Cardiologist: None   Subjective:    Still in AFL, V-rates 110s.    On DBA 5+ NE 3. Co-ox 62%.  UOP picking up, -2.8L out yesterday + 4.8L in fluid removal off CVVHD. Wt down another 8 lb. Unable to get accurate CVP reading.   AKI resolved. SCr 2.56>>1.96>>1.56>>1.18>>1.16  Na low at 128   He reports he had a "rough night" last night due to frequent coughing and chest wall discomfort, feels like he pulled a muscle.    Echo: EF < 20%, severe LV dilation, RV not well visualized.   Objective:   Weight Range: (!) 208.8 kg Body mass index is 64.2 kg/m.   Vital Signs:   Temp:  [97.3 F (36.3 C)-98 F (36.7 C)] 97.3 F (36.3 C) (07/28 0400) Pulse Rate:  [85-139] 113 (07/28 0100) Resp:  [20-36] 26 (07/28 0630) BP: (102-136)/(47-103) 127/92 (07/28 0630) SpO2:  [90 %-100 %] 98 % (07/28 0509) Weight:  [208.8 kg] 208.8 kg (07/28 0530) Last BM Date: 02/15/21  Weight change: Filed Weights   02/17/21 0606 02/18/21 0630 02/19/21 0530  Weight: (!) 218.4 kg (!) 212.4 kg (!) 208.8 kg    Intake/Output:   Intake/Output Summary (Last 24 hours) at 02/19/2021 0711 Last data filed at 02/19/2021 0700 Gross per 24 hour  Intake 2475.23 ml  Output 7712 ml  Net -5236.77 ml      Physical Exam   General:  obese. Coughing frequently. Normal WOB. HEENT: normal,  Neck: supple. thick neck, JVD not well visualized . Carotids 2+ bilat; no bruits. No lymphadenopathy or thyromegaly appreciated. Cor: PMI nondisplaced. Irregular rhythm, tachy rate. No rubs, gallops or murmurs. Lungs: decreased BS at the bases  Abdomen: obese soft, nontender, nondistended. No hepatosplenomegaly. No bruits or masses. Good bowel sounds. Extremities: no cyanosis, clubbing, rash, edema Neuro: alert & oriented x 3, cranial nerves grossly intact. moves all 4 extremities w/o difficulty. Affect  pleasant.   Telemetry   Atrial flutter 110s (personally reviewed)  Labs    CBC Recent Labs    02/18/21 0447 02/19/21 0515  WBC 7.5 8.6  HGB 11.5* 11.4*  HCT 36.0* 36.2*  MCV 93.5 93.8  PLT 234 [id number]   Basic Metabolic Panel Recent Labs    02/18/21 0447 02/18/21 1634 02/19/21 0500 02/19/21 0515  NA 133* 131*  --  128*  K 4.5 4.2  --  4.1  CL 97* 96*  --  94*  CO2 30 28  --  26  GLUCOSE 115* 215*  --  278*  BUN 14 12  --  11  CREATININE 1.18 1.10  --  1.16  CALCIUM 8.9 8.9  --  8.7*  MG 2.5*  --  2.2  --   PHOS 2.4* 2.0*  --  2.2*   Liver Function Tests Recent Labs    02/18/21 1634 02/19/21 0515  ALBUMIN 3.4* 3.3*    No results for input(s): LIPASE, AMYLASE in the last 72 hours. Cardiac Enzymes No results for input(s): CKTOTAL, CKMB, CKMBINDEX, TROPONINI in the last 72 hours.  BNP: BNP (last 3 results) Recent Labs    10/10/20 1336 11/07/20 0932 02/11/21 1500  BNP 126.9* 106.0* 194.3*    ProBNP (last 3 results) No results for input(s): PROBNP in the last 8760 hours.   D-Dimer No results for input(s): DDIMER in the last 72 hours.  Hemoglobin A1C No results for input(s): HGBA1C in the last 72 hours.  Fasting Lipid Panel No results for input(s): CHOL, HDL, LDLCALC, TRIG, CHOLHDL, LDLDIRECT in the last 72 hours. Thyroid Function Tests No results for input(s): TSH, T4TOTAL, T3FREE, THYROIDAB in the last 72 hours.  Invalid input(s): FREET3   Other results:   Imaging    No results found.   Medications:     Scheduled Medications:  (feeding supplement) PROSource Plus  30 mL Oral TID BM   apixaban  5 mg Oral BID   chlorhexidine  15 mL Mouth Rinse BID   Chlorhexidine Gluconate Cloth  6 each Topical Daily   mouth rinse  15 mL Mouth Rinse q12n4p   multivitamin  1 tablet Oral QHS   sodium chloride flush  10-40 mL Intracatheter Q12H   sodium chloride flush  3 mL Intravenous Q12H    Infusions:   prismasol BGK 4/2.5 400 mL/hr at 02/19/21  0238    prismasol BGK 4/2.5 200 mL/hr at 02/18/21 2349   sodium chloride     sodium chloride Stopped (02/16/21 2155)   amiodarone 30 mg/hr (02/19/21 0700)   DOBUTamine 5 mcg/kg/min (02/19/21 0700)   norepinephrine (LEVOPHED) Adult infusion 3 mcg/min (02/19/21 0700)   prismasol BGK 4/2.5 1,800 mL/hr at 02/19/21 0636    PRN Medications: sodium chloride, acetaminophen **OR** acetaminophen, alteplase, alum & mag hydroxide-simeth, diphenhydrAMINE, guaiFENesin-dextromethorphan, heparin, magnesium hydroxide, sodium chloride, sodium chloride flush, sodium chloride flush  Assessment/Plan   1. Acute on Chronic Biventricular Heart Failure:  Known cardiomyopathy since 2019, previously [facility] by [facility]  Echo 02/27/2020 with EF < 20%, moderate LV dilation, severely decreased RV function with severe RV dilation, severe biatrial enlargement, mild MR. No history of ETOH/drugs.  No FH of cardiomyopathy (mother with [place]).  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  He is too large for cardiac MRI. RHC/LHC on 03/04/20 with no CAD (nonischemic cardiomyopathy), elevated left and right heart filling pressures, and low CI at 2.1. 05/2020 Echo showed EF 20-25%. Evaluated by EP but not a candidate for ICD given BMI.  Echo this admission with EF < 20%, RV poorly visualized. He now has CHF exacerbation likely in setting of AFL with RVR.  He was hypotensive with poor UOP, concern for cardiogenic shock.  Dobutamine therefore started at 2.5 and increased to 5, NE added at 3 currently.  With progressive renal failure, CVVH started.  This morning, co-ox 62%. Wt trending down.   - Continue dobutamine 5 and NE 3, can place arterial line if needed. - Continue CVVH for fluid offloading  - Fluid restrict <1,800 ml/ day - With AKI/hypotension, stopped Entresto, dapagliflozin, spironolactone, digoxin.  - Need attempt at DCCV to restore NSR, once volume better.  - With marked  obesity, he is not a candidate for advanced therapies.  2. Aflutter w/ RVR: Noted to be in Afib prior admit, 3/22, in setting of CAP and a/c CHF but converted to NSR on amiodarone. Discharged home w/ eliquis. In 2:1 AFL this admit in setting of a/c CHF w/ marked volume overload, + recent gastroenteritis w/ diarrhea. Has OSA but not using CPAP. TSH WNL. Admits to intermittent compliance w/ Eliquis. Has missed several doses in the last month.  Now on amiodarone gtt 30 mg/hr with HR 110s.  - Continue amiodarone at 30 mg/hr.  - Continue Eliquis 5 mg bid.  - Plan TEE/DCCV this admit, will need more diuresis first. 3. Acute Hypoxic Respiratory Failure: suspect underlying  OHS/OSA exacerbated by CHF this admission.  - Continue oxygen + CPAP.  - Diurese.  4. Type 2 DM: Farxiga held with AKI.  5. OSA: Has not been on Bipap at home.  6. Super Morbid Obesity: Body mass index is 66.26 kg/m. - wt loss advised 7. Tooth Abscess: Has been on amoxicillin but changing to ceftriaxone with ?UTI.  8. UTI: Suspect by UA, sent urine culture.  ?Urinary obstruction related to this => no hydronephrosis on renal US.  - Ceftriaxone IV 9. Elevated Hs Trop: Suspect demand ischemia in setting of CHF + rapid AFL. Cath 8/21 showed no CAD.  10. AKI: Creatinine peaked to 3.32 though BUN not markedly high.  Concern for cardiorenal syndrome.  Renal US with no hydronephrosis. With poor UOP and rising creatinine + marked volume overload, he was started on CVVH.  - now resolved, SCr down to 1.16 today  - Continue CVVH for fluid offloading  11. Hypervolemic Hyponatremia: 128 today  - on CVVH, per nephrology  12. Cough - guaifenesin + tessalon pearls     Length of Stay: [address] PA-C  02/19/2021, 7:11 AM  Advanced Heart Failure Team Pager [phone] (M-F; 7a - 5p)  Please contact [facility] for night-coverage after hours (5p -7a ) and weekends on [email or web address]  Patient seen with PA, agree with the above note.     Co-ox 62% this morning.  Weight down 8 lbs.  CVP 17-18.  Good UOP, also pulling 150 cc/hr net UF via CVVH.   General: NAD Neck: Thick, JVP 16 cm, no thyromegaly or thyroid nodule.  Lungs: Clear to auscultation bilaterally with normal respiratory effort. CV: Nondisplaced PMI.  Heart mildly tachy, irregular S1/S2, no S3/S4, no murmur.  Trace ankle edema. Abdomen: Soft, nontender, no hepatosplenomegaly, no distention.  Skin: Intact without lesions or rashes.  Neurologic: Alert and oriented x 3.  Psych: Normal affect. Extremities: No clubbing or cyanosis.  HEENT: Normal.   Patient remains volume overloaded but improving with CVP down to 17-18.  Continue CVVH net 150 cc/hr, continue current dobutamine/NE.  He is making urine as well, hopefully will have renal recovery when we stop CVVH eventually.    He remains in atrial flutter on apixaban and amiodarone.  When volume status better-controlled, will need TEE-guided DCCV (probably Monday).  CRITICAL CARE Performed by: [name]  Total critical care time: 35 minutes  Critical care time was exclusive of separately billable procedures and treating other patients.  Critical care was necessary to treat or prevent imminent or life-threatening deterioration.  Critical care was time spent personally by me on the following activities: development of treatment plan with patient and/or surrogate as well as nursing, discussions with consultants, evaluation of patient's response to treatment, examination of patient, obtaining history from patient or surrogate, ordering and performing treatments and interventions, ordering and review of laboratory studies, ordering and review of radiographic studies, pulse oximetry and re-evaluation of patient's condition.  [name] 02/19/2021 8:28 AM

## 2021-02-19 NOTE — TOC CM/SW Note (Signed)
[facility] TOC CM continues to follow for dc needs. Pt will need Sleep Study outpatient. [employer] rep, Thedore Mins contacted for NIV/Trilogy. Pt was on Trilogy and in March turned device back in due to copay. DME agency willing to work with pt on [place], [place] [facility] TOC CM [phone]

## 2021-02-19 NOTE — Progress Notes (Signed)
[facility] Progress Note   35 y.o. year-old w/ hx of severe LV/ RV heart failure, super morbid obesity, HTN, OSA presented on 7/20 w/ SOB x 1 week, c/w CHF ->  IV lasix started and pt admitted. Atrial w/ RVR may have been cause of decomp CHF per cardiology team.  ECHO showed EF <20%. BP's dropped and pt moved to ICU for suspected cardiogenic shock. Started on IV dobutamine and norepi gtt's. CRRT started 7/24.  Assessment/ Plan:   AKI - creat 1.2 on admit, up to 3.3 in setting on cardiogenic shock, long-standing hx of severe bivent CHF, now w/ aflutter/ RVR.  Vol overload refractory to inotropes, pressors and IV lasix gtt ->  CRRT 7/24 ->    Seen on CRRT RIJ temp 400/200/1800 pre/post/qd Net UF 100-150 currently on levo, dobutamine and amio He was net neg 5837/24hr and by net I&O during hospitalization neg 11.6 L.  Filters clotting bec of frequent coughing; can UF 100-200 as long as UOP good and pressor req don't incr.  He's markedly up on his EDW (402 lbs earlier in 2022); continue on CRRT with max UF as tolerated -> 468 lbs today.  Pt tolerating CVVHD.  Vol overload - diffuse pulm edema by CXR, on bipap, prob ascites on exam, ^^CVP RV/ LV heart failure - per cardiology / CHF team -> waiting to optimize fluid status (markedly overloaded) prior to attempting DCCV. UTI on Ceftriaxone Morbid obesity. DM  Subjective:   Aflutter on amio, dobutamine + levophed but now off  lasix gtt,  and he is making good urine (2830 Margo Aye). Mask uncomfortable so he's been off mask since last night Wt 486 lbs -> 479 -> 468 -> 460.   Objective:   BP (!) 127/92   Pulse (!) 114   Temp 97.9 F (36.6 C) (Oral)   Resp (!) 25   Ht '5\' 11"'$  (1.803 m)   Wt (!) 208.8 kg   SpO2 96%   BMI 64.20 kg/m   Intake/Output Summary (Last 24 hours) at 02/19/2021 [id number] Last data filed at 02/19/2021 0800 Gross per 24 hour  Intake 2434.92 ml  Output 6898 ml  Net -4463.08 ml   Weight change: -3.6  kg  Physical Exam: Gen alert, no distress No rash, cyanosis or gangrene Sclera anicteric, throat clear No jvd or bruits Chest clear bilat to bases, no rales/ wheezing RRR no MRG Abd soft ntnd no mass or ascites +bs GU normal MS no joint effusions or deformity Ext 2+ UE > LE and abd pannus edema Neuro very alert this am in chair bedside , off bipap/ cpap mask  Imaging: No results found.  Labs: BMET Recent Labs  Lab 02/16/21 0428 02/16/21 1630 02/17/21 0456 02/17/21 1629 02/18/21 0447 02/18/21 1634 02/19/21 0515  NA 127* 130* 131* 131* 133* 131* 128*  K 3.7 4.2 4.6 4.5 4.5 4.2 4.1  CL 93* 95* 97* 97* 97* 96* 94*  CO2 '26 28 27 28 30 28 26  '$ GLUCOSE 225* 118* 138* 114* 115* 215* 278*  BUN 25* '19 17 16 14 12 11  '$ CREATININE 2.56* 1.96* 1.56* 1.37* 1.18 1.10 1.16  CALCIUM 7.8* 8.1* 8.5* 8.4* 8.9 8.9 8.7*  PHOS 4.4 3.2 2.5 2.3* 2.4* 2.0* 2.2*   CBC Recent Labs  Lab 02/16/21 0428 02/17/21 0456 02/18/21 0447 02/19/21 0515  WBC 7.1 7.9 7.5 8.6  HGB 10.8* 11.7* 11.5* 11.4*  HCT 34.0* 37.3* 36.0* 36.2*  MCV 93.4 93.7 93.5 93.8  PLT 241 249  234 212    Medications:     (feeding supplement) PROSource Plus  30 mL Oral TID BM   apixaban  5 mg Oral BID   benzonatate  100 mg Oral BID   chlorhexidine  15 mL Mouth Rinse BID   Chlorhexidine Gluconate Cloth  6 each Topical Daily   mouth rinse  15 mL Mouth Rinse q12n4p   multivitamin  1 tablet Oral QHS   sodium chloride flush  10-40 mL Intracatheter Q12H   sodium chloride flush  3 mL Intravenous Q12H      [name], MD 02/19/2021, 9:06 AM

## 2021-02-20 ENCOUNTER — Inpatient Hospital Stay (HOSPITAL_COMMUNITY): Payer: BC Managed Care – PPO

## 2021-02-20 DIAGNOSIS — I4892 Unspecified atrial flutter: Secondary | ICD-10-CM | POA: Diagnosis not present

## 2021-02-20 DIAGNOSIS — I5023 Acute on chronic systolic (congestive) heart failure: Secondary | ICD-10-CM | POA: Diagnosis not present

## 2021-02-20 LAB — RENAL FUNCTION PANEL
Albumin: 3.5 g/dL (ref 3.5–5.0)
Albumin: 3.5 g/dL (ref 3.5–5.0)
Anion gap: 6 (ref 5–15)
Anion gap: 4 — ABNORMAL LOW (ref 5–15)
BUN: 12 mg/dL (ref 6–20)
BUN: 11 mg/dL (ref 6–20)
CO2: 27 mmol/L (ref 22–32)
CO2: 27 mmol/L (ref 22–32)
Calcium: 9.2 mg/dL (ref 8.9–10.3)
Calcium: 8.9 mg/dL (ref 8.9–10.3)
Chloride: 98 mmol/L (ref 98–111)
Chloride: 100 mmol/L (ref 98–111)
Creatinine, Ser: 1.19 mg/dL (ref 0.61–1.24)
Creatinine, Ser: 1.28 mg/dL — ABNORMAL HIGH (ref 0.61–1.24)
GFR, Estimated: >60 mL/min (ref >60)
GFR, Estimated: >60 mL/min (ref >60)
Glucose, Bld: 168 mg/dL — ABNORMAL HIGH (ref 70–99)
Glucose, Bld: 179 mg/dL — ABNORMAL HIGH (ref 70–99)
Phosphorus: 2.7 mg/dL (ref 2.5–4.6)
Phosphorus: 2.4 mg/dL — ABNORMAL LOW (ref 2.5–4.6)
Potassium: 4.3 mmol/L (ref 3.5–5.1)
Potassium: 4.2 mmol/L (ref 3.5–5.1)
Sodium: 131 mmol/L — ABNORMAL LOW (ref 135–145)
Sodium: 131 mmol/L — ABNORMAL LOW (ref 135–145)

## 2021-02-20 LAB — COOXEMETRY PANEL
Carboxyhemoglobin: 1.4 % (ref 0.5–1.5)
Methemoglobin: 1.1 % (ref 0.0–1.5)
O2 Saturation: 65.4 %
Total hemoglobin: 11.8 g/dL — ABNORMAL LOW (ref 12.0–16.0)

## 2021-02-20 LAB — MAGNESIUM: Magnesium: 2.3 mg/dL (ref 1.7–2.4)

## 2021-02-20 LAB — APTT: aPTT: 33 seconds (ref 24–36)

## 2021-02-20 IMAGING — DX DG CHEST 1V PORT
1 series · 1 of 1 positions shown · non-contrast
Comparison: [DATE]

CLINICAL DATA: Dialysis catheter placement

EXAM:
PORTABLE CHEST 1 VIEW

[chest]
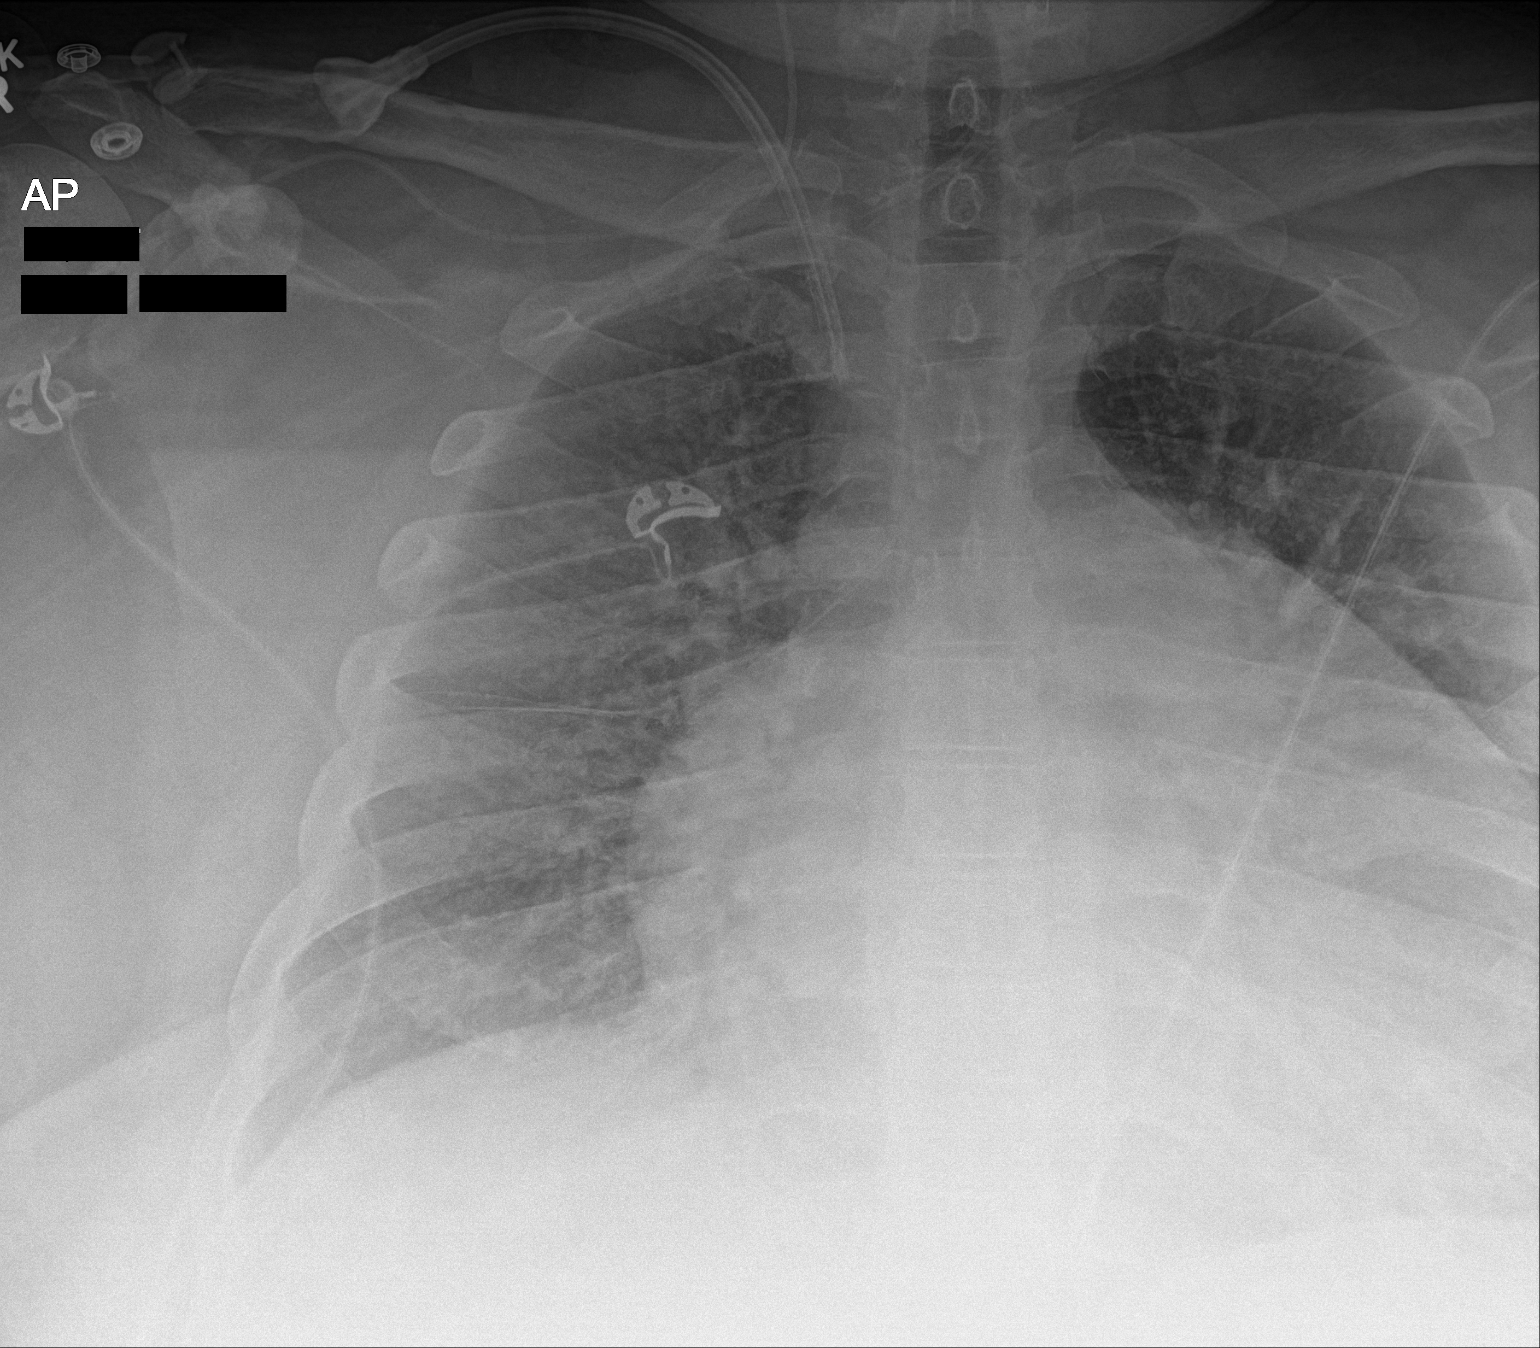

[1 of 1 positions shown; findings below may reference images not displayed]

FINDINGS: Right jugular central venous catheter tip appears to be in the right
innominate vein. This catheter appears to been exchanged since the
prior study. No pneumothorax

Cardiac enlargement with vascular congestion. Mild edema has
improved in the interval.
IMPRESSION: Central venous catheter tip right innominate vein.  No pneumothorax

Improvement in pulmonary edema.

## 2021-02-20 NOTE — Plan of Care (Signed)
  Problem: Education: Goal: Knowledge of General Education information will improve Description: Including pain rating scale, medication(s)/side effects and non-pharmacologic comfort measures Outcome: Progressing   Problem: Health Behavior/Discharge Planning: Goal: Ability to manage health-related needs will improve Outcome: Not Progressing   Problem: Clinical Measurements: Goal: Will remain free from infection Outcome: Progressing Goal: Diagnostic test results will improve Outcome: Progressing Goal: Respiratory complications will improve Outcome: Progressing Goal: Cardiovascular complication will be avoided Outcome: Progressing   Problem: Activity: Goal: Risk for activity intolerance will decrease Outcome: Progressing   Problem: Nutrition: Goal: Adequate nutrition will be maintained Outcome: Completed/Met

## 2021-02-20 NOTE — Progress Notes (Signed)
Physical Therapy Evaluation Patient Details Name: Phillip Young MRN: [id number] DOB: [birth year] Today's Date: 02/20/2021   History of Present Illness  35 y.o. year-old admitted 7/22 w/ SOB x 1 week. CHF exacerbation. Atrial w/ RVR may have been cause of decomp CHF per cardiology team.  ECHO showed EF <20%. BP's dropped and pt moved to ICU for suspected cardiogenic shock.CRRT started 7/24. PMH:  severe LV/ RV heart failure, super morbid obesity, HTN,  Clinical Impression  Pt admitted with above diagnosis. Pt refused to ambulate with PT even with max encouragement since pt was off of CRRT.  Nurse also tried to convince pt however he adamantly refused ambulation. Pt was standing on arrival beside the CRRT machine (disconnected) and balance in standing good.  Pt stood about 10 min while PT talked with him. Anticipate he will do well once he is participatory.  Will follow acutely.  Pt currently with functional limitations due to the deficits listed below (see PT Problem List). Pt will benefit from skilled PT to increase their independence and safety with mobility to allow discharge to the venue listed below.       Follow Up Recommendations No PT follow up    Equipment Recommendations  None recommended by PT    Recommendations for Other Services       Precautions / Restrictions Precautions Precautions: None Restrictions Weight Bearing Restrictions: No      Mobility  Bed Mobility Overal bed mobility: Independent                  Transfers Overall transfer level: Independent               General transfer comment: Pt was standing at side of bed beside CRRT machine (nurse had disconnected pt) and pt had one foot propped up on the chair beside the bed and was standing with good balance for prolonged duration.  Talked with pt for about 10 min trying to get pt to walk in hallway with PT however pt would not agree even with max encouragment.  Nurse tried to convince pt as well  Pt  adamantly refused.  Portable Xray arrived and was able to see pt take a few steps to bed and get into bed.  Ambulation/Gait             General Gait Details: Declined  Stairs            Wheelchair Mobility    Modified Rankin (Stroke Patients Only)       Balance Overall balance assessment: Needs assistance Sitting-balance support: No upper extremity supported;Feet supported Sitting balance-Leahy Scale: Fair     Standing balance support: No upper extremity supported;During functional activity Standing balance-Leahy Scale: Good Standing balance comment: No issues with static standing and dynamic appears fair as well although unable to challenge pts balance today due to pt refusing                             Pertinent Vitals/Pain Pain Assessment: No/denies pain    Home Living Family/patient expects to be discharged to:: Private residence Living Arrangements: Other relatives (uncle) Available Help at Discharge: Family;Available PRN/intermittently Type of Home: House Home Access: Level entry     Home Layout: One level Home Equipment: None      Prior Function Level of Independence: Independent               Hand Dominance  Extremity/Trunk Assessment   Upper Extremity Assessment Upper Extremity Assessment: Defer to OT evaluation    Lower Extremity Assessment Lower Extremity Assessment: Overall WFL for tasks assessed    Cervical / Trunk Assessment Cervical / Trunk Assessment: Normal  Communication   Communication: No difficulties  Cognition Arousal/Alertness: Awake/alert Behavior During Therapy: WFL for tasks assessed/performed Overall Cognitive Status: Within Functional Limits for tasks assessed                                        General Comments General comments (skin integrity, edema, etc.): VSS overall for session  Did remove 4LO2 to check sats  Pt hovered 89-91% on RA therefore nurse agreed to  replace O2 at 4L.    Exercises     Assessment/Plan    PT Assessment Patient needs continued PT services  PT Problem List Decreased activity tolerance;Decreased balance;Decreased mobility;Decreased knowledge of use of DME;Decreased safety awareness       PT Treatment Interventions DME instruction;Gait training;Functional mobility training;Therapeutic activities;Therapeutic exercise;Balance training;Patient/family education    PT Goals (Current goals can be found in the Care Plan section)  Acute Rehab PT Goals Patient Stated Goal: to get off the CRRT machine PT Goal Formulation: With patient Time For Goal Achievement: 03/06/21 Potential to Achieve Goals: Good    Frequency Min 3X/week   Barriers to discharge        Co-evaluation               AM-PAC PT "6 Clicks" Mobility  Outcome Measure Help needed turning from your back to your side while in a flat bed without using bedrails?: None Help needed moving from lying on your back to sitting on the side of a flat bed without using bedrails?: None Help needed moving to and from a bed to a chair (including a wheelchair)?: None Help needed standing up from a chair using your arms (e.g., wheelchair or bedside chair)?: None Help needed to walk in hospital room?: A Little Help needed climbing 3-5 steps with a railing? : A Little 6 Click Score: 22    End of Session Equipment Utilized During Treatment: Gait belt;Oxygen Activity Tolerance: Patient limited by fatigue Patient left: with call bell/phone within reach;in bed;with nursing/sitter in room Nurse Communication: Mobility status PT Visit Diagnosis: Muscle weakness (generalized) (M62.81)    Time: [id number] PT Time Calculation (min) (ACUTE ONLY): 14 min   Charges:   PT Evaluation $PT Eval Low Complexity: 1 Low          Bensyn Bornemann M,PT Acute Rehab Services [phone] (pager)   Alvira Philips 02/20/2021, 11:53 AM

## 2021-02-20 NOTE — Progress Notes (Addendum)
Patient ID: Phillip Young, male   DOB: 1986-04-03, 35 y.o.   MRN: [id number]     Advanced Heart Failure Rounding Note  PCP-Cardiologist: None   Subjective:   Weight peaked 490--> 457lbs today.   Still in AFL, V-rates 110s   On DBA 5+ NE 3. Co-ox 65%   Remains on CRRT pulling 150-200 per hour. Made >1375  cc of urine.    Wants to know when he go back to work.    Echo: EF < 20%, severe LV dilation, RV not well visualized.   Objective:   Weight Range: (!) 207.6 kg Body mass index is 63.84 kg/m.   Vital Signs:   Temp:  [98 F (36.7 C)] 98 F (36.7 C) (07/29 0305) Pulse Rate:  [91-118] 112 (07/29 0700) Resp:  [14-32] 27 (07/29 0700) BP: (87-145)/(48-93) 95/61 (07/29 0700) SpO2:  [89 %-100 %] 100 % (07/29 0700) Weight:  [207.6 kg] 207.6 kg (07/29 0556) Last BM Date: 02/19/21  Weight change: Filed Weights   02/18/21 0630 02/19/21 0530 02/20/21 0556  Weight: (!) 212.4 kg (!) 208.8 kg (!) 207.6 kg    Intake/Output:   Intake/Output Summary (Last 24 hours) at 02/20/2021 0706 Last data filed at 02/20/2021 0600 Gross per 24 hour  Intake 2794.68 ml  Output 6313 ml  Net -3518.32 ml      Physical Exam  CVP 16.  General:  In bed. No resp difficulty HEENT: normal Neck: supple. JVP difficult to assess due to body habitus.  Carotids 2+ bilat; no bruits. No lymphadenopathy or thryomegaly appreciated. RIJ HD cath  Cor: PMI nondisplaced. Tachy Regular rate & rhythm. No rubs, gallops or murmurs. Lungs: clear on 10 liters [place].  Abdomen: soft, nontender, nondistended. No hepatosplenomegaly. No bruits or masses. Good bowel sounds. Extremities: no cyanosis, clubbing, rash, edema. RUE PICC  Neuro: alert & orientedx3, cranial nerves grossly intact. moves all 4 extremities w/o difficulty. Affect pleasant .   Telemetry   A flutter 110s.   Labs    CBC Recent Labs    02/18/21 0447 02/19/21 0515  WBC 7.5 8.6  HGB 11.5* 11.4*  HCT 36.0* 36.2*  MCV 93.5 93.8  PLT 234 [id number]    Basic Metabolic Panel Recent Labs    02/19/21 0500 02/19/21 0515 02/19/21 1602 02/20/21 0518  NA  --    < > 133* 131*  K  --    < > 4.5 4.3  CL  --    < > 98 98  CO2  --    < > 28 27  GLUCOSE  --    < > 158* 168*  BUN  --    < > 10 12  CREATININE  --    < > 1.22 1.19  CALCIUM  --    < > 8.9 9.2  MG 2.2  --   --  2.3  PHOS  --    < > 3.1 2.7   < > = values in this interval not displayed.   Liver Function Tests Recent Labs    02/19/21 1602 02/20/21 0518  ALBUMIN 3.4* 3.5    No results for input(s): LIPASE, AMYLASE in the last 72 hours. Cardiac Enzymes No results for input(s): CKTOTAL, CKMB, CKMBINDEX, TROPONINI in the last 72 hours.  BNP: BNP (last 3 results) Recent Labs    10/10/20 1336 11/07/20 0932 02/11/21 1500  BNP 126.9* 106.0* 194.3*    ProBNP (last 3 results) No results for input(s): PROBNP in the last 8760 hours.  D-Dimer No results for input(s): DDIMER in the last 72 hours.  Hemoglobin A1C No results for input(s): HGBA1C in the last 72 hours.  Fasting Lipid Panel No results for input(s): CHOL, HDL, LDLCALC, TRIG, CHOLHDL, LDLDIRECT in the last 72 hours. Thyroid Function Tests No results for input(s): TSH, T4TOTAL, T3FREE, THYROIDAB in the last 72 hours.  Invalid input(s): FREET3   Other results:   Imaging    No results found.   Medications:     Scheduled Medications:  (feeding supplement) PROSource Plus  30 mL Oral TID BM   apixaban  5 mg Oral BID   benzonatate  100 mg Oral BID   chlorhexidine  15 mL Mouth Rinse BID   Chlorhexidine Gluconate Cloth  6 each Topical Daily   mouth rinse  15 mL Mouth Rinse q12n4p   multivitamin  1 tablet Oral QHS   sodium chloride flush  10-40 mL Intracatheter Q12H   sodium chloride flush  3 mL Intravenous Q12H    Infusions:   prismasol BGK 4/2.5 400 mL/hr at 02/20/21 0458    prismasol BGK 4/2.5 200 mL/hr at 02/20/21 0159   sodium chloride     sodium chloride Stopped (02/16/21 2155)    amiodarone 30 mg/hr (02/20/21 0600)   DOBUTamine 5 mcg/kg/min (02/20/21 0600)   norepinephrine (LEVOPHED) Adult infusion 3 mcg/min (02/20/21 0600)   prismasol BGK 4/2.5 1,800 mL/hr at 02/20/21 0310    PRN Medications: sodium chloride, acetaminophen **OR** acetaminophen, alteplase, alum & mag hydroxide-simeth, diphenhydrAMINE, guaiFENesin-dextromethorphan, heparin, heparin, magnesium hydroxide, sodium chloride, sodium chloride flush, sodium chloride flush  Assessment/Plan   1. Acute on Chronic Biventricular Heart Failure:  Known cardiomyopathy since 2019, previously followed by [facility].  Echo 02/27/2020 with EF < 20%, moderate LV dilation, severely decreased RV function with severe RV dilation, severe biatrial enlargement, mild MR. No history of ETOH/drugs.  No FH of cardiomyopathy (mother with [place]).  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  He is too large for cardiac MRI. RHC/LHC on 03/04/20 with no CAD (nonischemic cardiomyopathy), elevated left and right heart filling pressures, and low CI at 2.1. 05/2020 Echo showed EF 20-25%. Evaluated by EP but not a candidate for ICD given BMI.  Echo this admission with EF < 20%, RV poorly visualized. He now has CHF exacerbation likely in setting of AFL with RVR.  He was hypotensive with poor UOP, concern for cardiogenic shock.  Dobutamine therefore started at 2.5 and increased to 5, NE added at 3 currently.  With progressive renal failure, CVVH started.  This morning, co-ox 65. Wt trending down.   - Continue dobutamine 5 and NE 3, can place arterial line if needed. Continue current regimen.  - Continue CVVH for fluid offloading  - Fluid restrict <1,800 ml/ day - With AKI/hypotension, stopped Entresto, dapagliflozin, spironolactone, digoxin.  - Need attempt at DCCV to restore NSR, once volume better. Shot for next week.  - With marked obesity, he is not a candidate for advanced therapies.  2. Aflutter w/  RVR: Noted to be in Afib prior admit, 3/22, in setting of CAP and a/c CHF but converted to NSR on amiodarone. Discharged home w/ eliquis. In 2:1 AFL this admit in setting of a/c CHF w/ marked volume overload, + recent gastroenteritis w/ diarrhea. Has OSA but not using CPAP. TSH WNL. Admits to intermittent compliance w/ Eliquis. Has missed several doses in the last month.  Now on amiodarone gtt 30 mg/hr with HR 110s. - Continue amiodarone  at 30 mg/hr.  - Continue Eliquis 5 mg bid.  - Plan TEE/DCCV for Monday at 0730. 3. Acute Hypoxic Respiratory Failure: suspect underlying OHS/OSA exacerbated by CHF this admission.  - Continue oxygen + CPAP. Remains on 10 lites [place] 4. Type 2 DM: Farxiga held with AKI.  5. OSA: Has not been on Bipap at home.  6. Super Morbid Obesity: Body mass index is 63.84 kg/m. 7. Tooth Abscess: Has been on amoxicillin but changing to ceftriaxone with ?UTI.  8. UTI: Suspect by UA, sent urine culture.  ?Urinary obstruction related to this => no hydronephrosis on renal US.  - Ceftriaxone IV 9. Elevated Hs Trop: Suspect demand ischemia in setting of CHF + rapid AFL. Cath 8/21 showed no CAD.  10. AKI: Creatinine peaked to 3.32 though BUN not markedly high.  Concern for cardiorenal syndrome.  Renal US with no hydronephrosis.  - Remains on CVVHD. -Nephrology appreciated.  11. Hypervolemic Hyponatremia: 131 today  - on CVVH, per nephrology  12. Cough - guaifenesin + tessalon pearls    Consult PT. TEE/DC-CV Monday at 730    Length of Stay: [place], NP  02/20/2021, 7:06 AM  Advanced Heart Failure Team Pager [phone] (M-F; 7a - 5p)  Please contact [facility] for night-coverage after hours (5p -7a ) and weekends on [email or web address]   Patient seen with NP, agree with the above note .  Co-ox 65% this morning.  Weight down again.  CVP 16.  Good UOP, also pulling 150 cc/hr net UF via CVVH.   General: NAD Neck: Thick, JVP 14-16 no thyromegaly or thyroid nodule.  Lungs: Clear  to auscultation bilaterally with normal respiratory effort. CV: Nondisplaced PMI.  Heart mildly tachy, irregular S1/S2, no S3/S4, no murmur.  Trace ankle edema.  Abdomen: Soft, nontender, no hepatosplenomegaly, no distention.  Skin: Intact without lesions or rashes.  Neurologic: Alert and oriented x 3.  Psych: Normal affect. Extremities: No clubbing or cyanosis.  HEENT: Normal.    Patient remains volume overloaded but improving with CVP down to 16.  Continue CVVH net 150 cc/hr, continue current dobutamine/NE.  He is making urine as well, hopefully will have renal recovery when we stop CVVH eventually.    He remains in atrial flutter on apixaban and amiodarone.  When volume status better-controlled, will need TEE-guided DCCV (probably Monday).  CRITICAL CARE Performed by: [name]  Total critical care time: 35 minutes  Critical care time was exclusive of separately billable procedures and treating other patients.  Critical care was necessary to treat or prevent imminent or life-threatening deterioration.  Critical care was time spent personally by me on the following activities: development of treatment plan with patient and/or surrogate as well as nursing, discussions with consultants, evaluation of patient's response to treatment, examination of patient, obtaining history from patient or surrogate, ordering and performing treatments and interventions, ordering and review of laboratory studies, ordering and review of radiographic studies, pulse oximetry and re-evaluation of patient's condition.  [name] 02/20/2021 8:02 AM

## 2021-02-20 NOTE — Progress Notes (Signed)
[facility] Progress Note   35 y.o. year-old w/ hx of severe LV/ RV heart failure, super morbid obesity, HTN, OSA presented on 7/20 w/ SOB x 1 week, c/w CHF ->  IV lasix started and pt admitted. Atrial w/ RVR may have been cause of decomp CHF per cardiology team.  ECHO showed EF <20%. BP's dropped and pt moved to ICU for suspected cardiogenic shock. Started on IV dobutamine and norepi gtt's. CRRT started 7/24.  Assessment/ Plan:   AKI - creat 1.2 on admit, up to 3.3 in setting on cardiogenic shock, long-standing hx of severe bivent CHF, now w/ aflutter/ RVR.  Vol overload refractory to inotropes, pressors and IV lasix gtt ->  CRRT 7/24 ->    Seen on CRRT RIJ temp 400/200/1800 pre/post/qd Net UF 150-200 currently on levo, dobutamine and amio He was net neg 5837/24hr and by net I&O during hospitalization neg 17 L.  Filters clotting bec of frequent coughing; can UF 100-200 as long as UOP good and pressor req don't incr.  He's markedly up on his EDW (402 lbs earlier in 2022); continue on CRRT with max UF as tolerated -> 457 lbs today.  Pt tolerating CVVHD. Filter clotting frequently; will check CXR some of the catheter is outside the venotomy although the flush and return is good through both ports.  CVP 16-17 -> likely will need to continue CRRT through the weekend  Vol overload - diffuse pulm edema by CXR, on bipap, prob ascites on exam, ^^CVP (16-17) RV/ LV heart failure - per cardiology / CHF team -> waiting to optimize fluid status (markedly overloaded) prior to attempting DCCV. UTI on Ceftriaxone Morbid obesity. DM  Subjective:   Aflutter on amio, dobutamine + levophed and  off  lasix gtt,  and he is making good urine (2830 Margo Aye). Mask uncomfortable so he's been off mask Wt 486 lbs -> 479 -> 468 -> 460 -> 457.   Objective:   BP (!) 137/105   Pulse (!) 113   Temp 98 F (36.7 C) (Oral)   Resp (!) 22   Ht '5\' 11"'$  (1.803 m)   Wt (!) 207.6 kg   SpO2 96%   BMI 63.84  kg/m   Intake/Output Summary (Last 24 hours) at 02/20/2021 0954 Last data filed at 02/20/2021 0800 Gross per 24 hour  Intake 2666.41 ml  Output 6280 ml  Net -3613.59 ml   Weight change: -1.189 kg  Physical Exam: Gen alert, no distress No rash, cyanosis or gangrene Sclera anicteric, throat clear No jvd or bruits Chest clear bilat to bases, no rales/ wheezing RRR no MRG Abd soft ntnd no mass or ascites +bs GU normal MS no joint effusions or deformity Ext 2+ UE > LE and abd pannus edema Neuro very alert this am in chair bedside , off bipap/ cpap mask  Imaging: No results found.  Labs: BMET Recent Labs  Lab 02/17/21 0456 02/17/21 1629 02/18/21 0447 02/18/21 1634 02/19/21 0515 02/19/21 1602 02/20/21 0518  NA 131* 131* 133* 131* 128* 133* 131*  K 4.6 4.5 4.5 4.2 4.1 4.5 4.3  CL 97* 97* 97* 96* 94* 98 98  CO2 '27 28 30 28 26 28 27  '$ GLUCOSE 138* 114* 115* 215* 278* 158* 168*  BUN '17 16 14 12 11 10 12  '$ CREATININE 1.56* 1.37* 1.18 1.10 1.16 1.22 1.19  CALCIUM 8.5* 8.4* 8.9 8.9 8.7* 8.9 9.2  PHOS 2.5 2.3* 2.4* 2.0* 2.2* 3.1 2.7   CBC Recent Labs  Lab 02/16/21 0428 02/17/21 0456 02/18/21 0447 02/19/21 0515  WBC 7.1 7.9 7.5 8.6  HGB 10.8* 11.7* 11.5* 11.4*  HCT 34.0* 37.3* 36.0* 36.2*  MCV 93.4 93.7 93.5 93.8  PLT 241 249 234 212    Medications:     (feeding supplement) PROSource Plus  30 mL Oral TID BM   apixaban  5 mg Oral BID   benzonatate  100 mg Oral BID   chlorhexidine  15 mL Mouth Rinse BID   Chlorhexidine Gluconate Cloth  6 each Topical Daily   mouth rinse  15 mL Mouth Rinse q12n4p   multivitamin  1 tablet Oral QHS   sodium chloride flush  10-40 mL Intracatheter Q12H   sodium chloride flush  3 mL Intravenous Q12H      [name], MD 02/20/2021, 9:54 AM

## 2021-02-21 DIAGNOSIS — I5023 Acute on chronic systolic (congestive) heart failure: Secondary | ICD-10-CM | POA: Diagnosis not present

## 2021-02-21 LAB — RENAL FUNCTION PANEL
Albumin: 3.5 g/dL (ref 3.5–5.0)
Albumin: 3.5 g/dL (ref 3.5–5.0)
Anion gap: 7 (ref 5–15)
Anion gap: 12 (ref 5–15)
BUN: 11 mg/dL (ref 6–20)
BUN: 12 mg/dL (ref 6–20)
CO2: 26 mmol/L (ref 22–32)
CO2: 22 mmol/L (ref 22–32)
Calcium: 9 mg/dL (ref 8.9–10.3)
Calcium: 8.8 mg/dL — ABNORMAL LOW (ref 8.9–10.3)
Chloride: 95 mmol/L — ABNORMAL LOW (ref 98–111)
Chloride: 95 mmol/L — ABNORMAL LOW (ref 98–111)
Creatinine, Ser: 1.3 mg/dL — ABNORMAL HIGH (ref 0.61–1.24)
Creatinine, Ser: 1.26 mg/dL — ABNORMAL HIGH (ref 0.61–1.24)
GFR, Estimated: >60 mL/min (ref >60)
GFR, Estimated: >60 mL/min (ref >60)
Glucose, Bld: 246 mg/dL — ABNORMAL HIGH (ref 70–99)
Glucose, Bld: 298 mg/dL — ABNORMAL HIGH (ref 70–99)
Phosphorus: 2.7 mg/dL (ref 2.5–4.6)
Phosphorus: 2.7 mg/dL (ref 2.5–4.6)
Potassium: 4.4 mmol/L (ref 3.5–5.1)
Potassium: 3.9 mmol/L (ref 3.5–5.1)
Sodium: 128 mmol/L — ABNORMAL LOW (ref 135–145)
Sodium: 129 mmol/L — ABNORMAL LOW (ref 135–145)

## 2021-02-21 LAB — COOXEMETRY PANEL
Carboxyhemoglobin: 1.5 % (ref 0.5–1.5)
Methemoglobin: 0.9 % (ref 0.0–1.5)
O2 Saturation: 62.1 %
Total hemoglobin: 11 g/dL — ABNORMAL LOW (ref 12.0–16.0)

## 2021-02-21 LAB — APTT
aPTT: 27 seconds (ref 24–36)
aPTT: 45 seconds — ABNORMAL HIGH (ref 24–36)

## 2021-02-21 LAB — MAGNESIUM: Magnesium: 2.3 mg/dL (ref 1.7–2.4)

## 2021-02-21 MED ORDER — HEPARIN (PORCINE) 25000 UT/250ML-% IV SOLN
3200.0000 [IU]/h | INTRAVENOUS | Status: DC
  Administered 2021-02-21: 1700 [IU]/h via INTRAVENOUS
  Administered 2021-02-22: 2400 [IU]/h via INTRAVENOUS
  Administered 2021-02-23 (×2): 2600 [IU]/h via INTRAVENOUS
  Administered 2021-02-24: 2900 [IU]/h via INTRAVENOUS
  Administered 2021-02-24: 2800 [IU]/h via INTRAVENOUS
  Administered 2021-02-25 – 2021-02-28 (×9): 2900 [IU]/h via INTRAVENOUS
  Administered 2021-02-28 – 2021-03-01 (×2): 2950 [IU]/h via INTRAVENOUS
  Administered 2021-03-02 – 2021-03-03 (×3): 3300 [IU]/h via INTRAVENOUS
  Administered 2021-03-03 – 2021-03-05 (×6): 3200 [IU]/h via INTRAVENOUS
  Filled 2021-02-21 (×32): qty 250

## 2021-02-21 MED ORDER — HEPARIN BOLUS VIA INFUSION (CRRT): 1000.0000 [IU] | INTRAVENOUS | Status: DC | PRN

## 2021-02-21 MED ORDER — SODIUM CHLORIDE 0.9 % IV SOLN: 250.0000 [IU]/h | INTRAVENOUS | Status: DC

## 2021-02-21 NOTE — Progress Notes (Signed)
[facility] Progress Note   35 y.o. year-old w/ hx of severe LV/ RV heart failure, super morbid obesity, HTN, OSA presented on 7/20 w/ SOB x 1 week, c/w CHF ->  IV lasix started and pt admitted. Atrial w/ RVR may have been cause of decomp CHF per cardiology team.  ECHO showed EF <20%. BP's dropped and pt moved to ICU for suspected cardiogenic shock. Started on IV dobutamine and norepi gtt's. CRRT started 7/24.  Assessment/ Plan:   AKI - creat 1.2 on admit, up to 3.3 in setting on cardiogenic shock, long-standing hx of severe bivent CHF, now w/ aflutter/ RVR.  Vol overload refractory to inotropes, pressors and IV lasix gtt ->  CRRT 7/24 ->    Seen on CRRT RIJ temp 400/200/1800 pre/post/qd Net UF 150-200 currently on levo, dobutamine and amio He was net neg 5837/24hr and by net I&O during hospitalization neg 16.35 L.  Filters clotting bec of frequent coughing; can UF 100-200 as long as UOP good and pressor req don't incr. UOP is trending down but still not in oliguric range -> will give trial with heparin to see if he doesn't clot the filter so often which leads to decr time with UF.  He's markedly up on his EDW (402 lbs earlier in 2022); continue on CRRT with max UF as tolerated -> 457 lbs today.  Pt tolerating CVVHD.   Filter clotting frequently, CXR shows tip of  catheter in the innominate vein; flush and return are good through both ports.  CVP 19 -> likely will need to continue CRRT for another week  Vol overload - diffuse pulm edema by CXR, on bipap, prob ascites on exam, ^^CVP (19) RV/ LV heart failure - per cardiology / CHF team -> waiting to optimize fluid status (markedly overloaded) prior to attempting DCCV. UTI on Ceftriaxone Morbid obesity. DM  Subjective:   Aflutter on amio, dobutamine + levophed and  off  lasix gtt,  but UOP starting to drop  (2830 -> [address]). Mask uncomfortable so he's been off mask. Coughing esp after eating which is chronic per pt. Wt  486 lbs -> 479 -> 468 -> 460 -> 457 -> 452.   Objective:   BP 113/89   Pulse 87   Temp 97.7 F (36.5 C) (Axillary)   Resp (!) 25   Ht '5\' 11"'$  (1.803 m)   Wt (!) 205.3 kg   SpO2 97%   BMI 63.13 kg/m   Intake/Output Summary (Last 24 hours) at 02/21/2021 0900 Last data filed at 02/21/2021 0800 Gross per 24 hour  Intake 2718.75 ml  Output 5132 ml  Net -2413.25 ml   Weight change: -2.311 kg  Physical Exam: Gen alert, no distress Chest clear bilat to bases, no rales/ wheezing RRR no MRG Abd soft ntnd no mass or ascites +bs MS no joint effusions or deformity Ext 2+ UE > LE and abd pannus edema Neuro very alert this am in chair bedside , off bipap/ cpap mask  Imaging: DG CHEST PORT 1 VIEW  Result Date: 02/20/2021 CLINICAL DATA:  Dialysis catheter placement EXAM: PORTABLE CHEST 1 VIEW COMPARISON:  02/15/2021 FINDINGS: Right jugular central venous catheter tip appears to be in the right innominate vein. This catheter appears to been exchanged since the prior study. No pneumothorax Cardiac enlargement with vascular congestion. Mild edema has improved in the interval. IMPRESSION: Central venous catheter tip right innominate vein.  No pneumothorax Improvement in pulmonary edema. Electronically Signed   By: Juanda Crumble  Carlis Abbott M.D.   On: 02/20/2021 10:49    Labs: BMET Recent Labs  Lab 02/18/21 0447 02/18/21 1634 02/19/21 0515 02/19/21 1602 02/20/21 0518 02/20/21 1617 02/21/21 0522  NA 133* 131* 128* 133* 131* 131* 128*  K 4.5 4.2 4.1 4.5 4.3 4.2 4.4  CL 97* 96* 94* 98 98 100 95*  CO2 '30 28 26 28 27 27 26  '$ GLUCOSE 115* 215* 278* 158* 168* 179* 246*  BUN '14 12 11 10 12 11 11  '$ CREATININE 1.18 1.10 1.16 1.22 1.19 1.28* 1.30*  CALCIUM 8.9 8.9 8.7* 8.9 9.2 8.9 9.0  PHOS 2.4* 2.0* 2.2* 3.1 2.7 2.4* 2.7   CBC Recent Labs  Lab 02/16/21 0428 02/17/21 0456 02/18/21 0447 02/19/21 0515  WBC 7.1 7.9 7.5 8.6  HGB 10.8* 11.7* 11.5* 11.4*  HCT 34.0* 37.3* 36.0* 36.2*  MCV 93.4 93.7 93.5  93.8  PLT 241 249 234 212    Medications:     (feeding supplement) PROSource Plus  30 mL Oral TID BM   apixaban  5 mg Oral BID   benzonatate  100 mg Oral BID   chlorhexidine  15 mL Mouth Rinse BID   Chlorhexidine Gluconate Cloth  6 each Topical Daily   mouth rinse  15 mL Mouth Rinse q12n4p   multivitamin  1 tablet Oral QHS   sodium chloride flush  10-40 mL Intracatheter Q12H   sodium chloride flush  3 mL Intravenous Q12H      [name], MD 02/21/2021, 9:00 AM

## 2021-02-21 NOTE — Progress Notes (Signed)
ANTICOAGULATION CONSULT NOTE   Pharmacy Consult for heparin  Indication: atrial fibrillation  Allergies  Allergen Reactions   Coreg [Carvedilol] Shortness Of Breath and Diarrhea    Wheezing     Patient Measurements: Height: '5\' 11"'$  (180.3 cm) Weight: (!) 205.3 kg (452 lb 9.7 oz) IBW/kg (Calculated) : 75.3 HEPARIN DW (KG): 130.5   Vital Signs: Temp: 97.9 F (36.6 C) (07/30 1600) Temp Source: Axillary (07/30 1600) BP: 127/73 (07/30 1840) Pulse Rate: 113 (07/30 1840)  Labs: Recent Labs    02/19/21 0515 02/19/21 1602 02/20/21 0518 02/20/21 1617 02/21/21 0522 02/21/21 1647 02/21/21 1837  HGB 11.4*  --   --   --   --   --   --   HCT 36.2*  --   --   --   --   --   --   PLT 212  --   --   --   --   --   --   APTT  --   --  33  --  27  --  45*  CREATININE 1.16   < > 1.19 1.28* 1.30* 1.26*  --    < > = values in this interval not displayed.     Estimated Creatinine Clearance: 147.3 mL/min (A) (by C-G formula based on SCr of 1.26 mg/dL (H)).   Medical History: Past Medical History:  Diagnosis Date   Biventricular congestive heart failure ([place])    Last Echo 11/2019 at [facility] reveals EF 20%   Class 3 severe obesity due to excess calories with serious comorbidity and body mass index (BMI) of 50.0 to 59.9 in adult [facility]) 02/26/2020   Essential hypertension 02/26/2020   GERD without esophagitis 02/26/2020   Hidradenitis suppurativa 02/26/2020   OSA (obstructive sleep apnea) 02/26/2020   Prediabetes 02/26/2020     Assessment 35 yo male on apixaban for afib. He has acute HF and on CRRT and plans to start heparin. Last dose of apixaban given 7/29 ~ 10pm. Plans noted for DCCV on 8/1 -Hg= 11.7  APTT this evening below goal.  No overt bleeding or complications noted.  Goal of Therapy:  Heparin level 0.3-0.7 units/ml aPTT 66-102 seconds Monitor platelets by anticoagulation protocol: Yes   Plan:  -No heparin bolus with recent apixaban -Increase IV heparin to 2100  units/hr. -aPTT in 8 hours -Daily aPTT, heparin level and CBC  [place] Crane, Roylene Reason, BCCP Clinical Pharmacist  02/21/2021 8:06 PM   [facility] pharmacy phone numbers are listed on [email or web address]

## 2021-02-21 NOTE — Progress Notes (Signed)
Patient ID: Phillip Young, male   DOB: [birth year], 35 y.o.   MRN: [id number]     Advanced Heart Failure Rounding Note  PCP-Cardiologist: None   Subjective:    Weight peaked 490--> 453 lbs today.   Still in AFL, V-rates 110s   On DBA 5+ NE 3. Co-ox 62%   Remains on CRRT pulling 150 per hour net. Less UOP over the last day (885 cc).  CVP still 19-20.   Has been up to chair.   Echo: EF < 20%, severe LV dilation, RV not well visualized.   Objective:   Weight Range: (!) 205.3 kg Body mass index is 63.13 kg/m.   Vital Signs:   Temp:  [97.6 F (36.4 C)-97.8 F (36.6 C)] 97.8 F (36.6 C) (07/30 0516) Pulse Rate:  [55-115] 55 (07/30 0400) Resp:  [19-35] 24 (07/30 0400) BP: (86-137)/(56-105) 127/69 (07/30 0606) SpO2:  [84 %-100 %] 96 % (07/30 0400) Weight:  [205.3 kg] 205.3 kg (07/30 0601) Last BM Date: 02/20/21  Weight change: Filed Weights   02/19/21 0530 02/20/21 0556 02/21/21 0601  Weight: (!) 208.8 kg (!) 207.6 kg (!) 205.3 kg    Intake/Output:   Intake/Output Summary (Last 24 hours) at 02/21/2021 0728 Last data filed at 02/21/2021 0700 Gross per 24 hour  Intake 2998.46 ml  Output 5394 ml  Net -2395.54 ml      Physical Exam   CVP 19-20.  General: NAD Neck: JVP 16+, no thyromegaly or thyroid nodule.  Lungs: Clear to auscultation bilaterally with normal respiratory effort. CV: Nonpalpable PMI.  Heart mildly tachy, irregular S1/S2, no S3/S4, no murmur.  Trace lower leg edema.  Abdomen: Soft, nontender, no hepatosplenomegaly, no distention.  Skin: Intact without lesions or rashes.  Neurologic: Alert and oriented x 3.  Psych: Normal affect. Extremities: No clubbing or cyanosis.  HEENT: Normal.    Telemetry   A flutter 110s.   Labs    CBC Recent Labs    02/19/21 0515  WBC 8.6  HGB 11.4*  HCT 36.2*  MCV 93.8  PLT 99991111   Basic Metabolic Panel Recent Labs    02/20/21 0518 02/20/21 1617 02/21/21 0522  NA 131* 131* 128*  K 4.3 4.2 4.4  CL 98  100 95*  CO2 '27 27 26  '$ GLUCOSE 168* 179* 246*  BUN '12 11 11  '$ CREATININE 1.19 1.28* 1.30*  CALCIUM 9.2 8.9 9.0  MG 2.3  --  2.3  PHOS 2.7 2.4* 2.7   Liver Function Tests Recent Labs    02/20/21 1617 02/21/21 0522  ALBUMIN 3.5 3.5    No results for input(s): LIPASE, AMYLASE in the last 72 hours. Cardiac Enzymes No results for input(s): CKTOTAL, CKMB, CKMBINDEX, TROPONINI in the last 72 hours.  BNP: BNP (last 3 results) Recent Labs    10/10/20 1336 11/07/20 0932 02/11/21 1500  BNP 126.9* 106.0* 194.3*    ProBNP (last 3 results) No results for input(s): PROBNP in the last 8760 hours.   D-Dimer No results for input(s): DDIMER in the last 72 hours.  Hemoglobin A1C No results for input(s): HGBA1C in the last 72 hours.  Fasting Lipid Panel No results for input(s): CHOL, HDL, LDLCALC, TRIG, CHOLHDL, LDLDIRECT in the last 72 hours. Thyroid Function Tests No results for input(s): TSH, T4TOTAL, T3FREE, THYROIDAB in the last 72 hours.  Invalid input(s): FREET3   Other results:   Imaging    DG CHEST PORT 1 VIEW  Result Date: 02/20/2021 CLINICAL DATA:  Dialysis catheter placement  EXAM: PORTABLE CHEST 1 VIEW COMPARISON:  02/15/2021 FINDINGS: Right jugular central venous catheter tip appears to be in the right innominate vein. This catheter appears to been exchanged since the prior study. No pneumothorax Cardiac enlargement with vascular congestion. Mild edema has improved in the interval. IMPRESSION: Central venous catheter tip right innominate vein.  No pneumothorax Improvement in pulmonary edema. Electronically Signed   By: Franchot Gallo M.D.   On: 02/20/2021 10:49     Medications:     Scheduled Medications:  (feeding supplement) PROSource Plus  30 mL Oral TID BM   apixaban  5 mg Oral BID   benzonatate  100 mg Oral BID   chlorhexidine  15 mL Mouth Rinse BID   Chlorhexidine Gluconate Cloth  6 each Topical Daily   mouth rinse  15 mL Mouth Rinse q12n4p    multivitamin  1 tablet Oral QHS   sodium chloride flush  10-40 mL Intracatheter Q12H   sodium chloride flush  3 mL Intravenous Q12H    Infusions:   prismasol BGK 4/2.5 400 mL/hr at 02/20/21 1917    prismasol BGK 4/2.5 200 mL/hr at 02/21/21 0358   sodium chloride     sodium chloride Stopped (02/16/21 2155)   amiodarone 30 mg/hr (02/21/21 0700)   DOBUTamine 5 mcg/kg/min (02/21/21 0700)   norepinephrine (LEVOPHED) Adult infusion 3 mcg/min (02/21/21 0700)   prismasol BGK 4/2.5 1,800 mL/hr at 02/21/21 0644    PRN Medications: sodium chloride, acetaminophen **OR** acetaminophen, alteplase, alum & mag hydroxide-simeth, diphenhydrAMINE, guaiFENesin-dextromethorphan, heparin, heparin, magnesium hydroxide, sodium chloride, sodium chloride flush, sodium chloride flush  Assessment/Plan   1. Acute on Chronic Biventricular Heart Failure:  Known cardiomyopathy since 2019, previously followed by [facility].  Echo 02/27/2020 with EF < 20%, moderate LV dilation, severely decreased RV function with severe RV dilation, severe biatrial enlargement, mild MR. No history of ETOH/drugs.  No FH of cardiomyopathy (mother with [place]).  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  He is too large for cardiac MRI. RHC/LHC on 03/04/20 with no CAD (nonischemic cardiomyopathy), elevated left and right heart filling pressures, and low CI at 2.1. 05/2020 Echo showed EF 20-25%. Evaluated by EP but not a candidate for ICD given BMI.  Echo this admission with EF < 20%, RV poorly visualized. He now has CHF exacerbation likely in setting of AFL with RVR.  He was hypotensive with poor UOP, concern for cardiogenic shock.  Dobutamine therefore started at 2.5 and increased to 5, NE added at 3 currently.  With progressive renal failure, CVVH started.  This morning, co-ox 62%. Weight down another 5 lbs but CVP still 19-20.  - Continue dobutamine 5 and NE 3.   - Continue CVVH for fluid offloading,  pulling 150 cc/hr net (would not increase).  - Fluid restrict <1,800 ml/ day - With AKI/hypotension, stopped Entresto, dapagliflozin, spironolactone, digoxin.  - Need attempt at DCCV to restore NSR, once volume better. Hopefully next week.   - With marked obesity, he is not a candidate for advanced therapies.  2. Aflutter w/ RVR: Noted to be in Afib prior admit, 3/22, in setting of CAP and a/c CHF but converted to NSR on amiodarone. Discharged home w/ eliquis. In 2:1 AFL this admit in setting of a/c CHF w/ marked volume overload, + recent gastroenteritis w/ diarrhea. Has OSA but not using CPAP. TSH WNL. Admits to intermittent compliance w/ Eliquis. Has missed several doses in the last month.  Now on amiodarone gtt 30 mg/hr  with HR 110s. - Continue amiodarone at 30 mg/hr.  - Continue Eliquis 5 mg bid.  - Plan TEE/DCCV for Monday at 0730. 3. Acute Hypoxic Respiratory Failure: Suspect underlying OHS/OSA exacerbated by CHF this admission.  - Continue oxygen + CPAP.  4. Type 2 DM: Farxiga held with AKI.  5. OSA: Has not been on Bipap at home.  6. Super Morbid Obesity: Body mass index is 63.13 kg/m. 7. Tooth Abscess: Completed ceftriaxone. 8. UTI: Suspect by UA, sent urine culture.  ?Urinary obstruction related to this => no hydronephrosis on renal US.  - Ceftriaxone IV course completed.  9. Elevated Hs Trop: Suspect demand ischemia in setting of CHF + rapid AFL. Cath 8/21 showed no CAD.  10. AKI: Creatinine peaked to 3.32 though BUN not markedly high.  Concern for cardiorenal syndrome.  Renal US with no hydronephrosis. Remains on CVVHD, pulling 150 cc/hr.  Not as much UOP over the last day. - Nephrology appreciated.   CRITICAL CARE Performed by: [name]  Total critical care time: 35 minutes  Critical care time was exclusive of separately billable procedures and treating other patients.  Critical care was necessary to treat or prevent imminent or life-threatening  deterioration.  Critical care was time spent personally by me on the following activities: development of treatment plan with patient and/or surrogate as well as nursing, discussions with consultants, evaluation of patient's response to treatment, examination of patient, obtaining history from patient or surrogate, ordering and performing treatments and interventions, ordering and review of laboratory studies, ordering and review of radiographic studies, pulse oximetry and re-evaluation of patient's condition.  Length of Stay: [place], MD  02/21/2021, 7:28 AM  Advanced Heart Failure Team Pager [phone] (M-F; 7a - 5p)  Please contact [facility] for night-coverage after hours (5p -7a ) and weekends on [email or web address]

## 2021-02-21 NOTE — Progress Notes (Signed)
ANTICOAGULATION CONSULT NOTE - Initial Consult  Pharmacy Consult for heparin  Indication: atrial fibrillation  Allergies  Allergen Reactions   Coreg [Carvedilol] Shortness Of Breath and Diarrhea    Wheezing     Patient Measurements: Height: '5\' 11"'$  (180.3 cm) Weight: (!) 205.3 kg (452 lb 9.7 oz) IBW/kg (Calculated) : 75.3 HEPARIN DW (KG): 130.5   Vital Signs: Temp: 97.7 F (36.5 C) (07/30 0800) Temp Source: Axillary (07/30 0800) BP: 113/89 (07/30 0800) Pulse Rate: 87 (07/30 0800)  Labs: Recent Labs    02/19/21 0500 02/19/21 0515 02/19/21 1602 02/20/21 0518 02/20/21 1617 02/21/21 0522  HGB  --  11.4*  --   --   --   --   HCT  --  36.2*  --   --   --   --   PLT  --  212  --   --   --   --   APTT 36  --   --  33  --  27  CREATININE  --  1.16   < > 1.19 1.28* 1.30*   < > = values in this interval not displayed.    Estimated Creatinine Clearance: 142.8 mL/min (A) (by C-G formula based on SCr of 1.3 mg/dL (H)).   Medical History: Past Medical History:  Diagnosis Date   Biventricular congestive heart failure ([place])    Last Echo 11/2019 at [facility] reveals EF 20%   Class 3 severe obesity due to excess calories with serious comorbidity and body mass index (BMI) of 50.0 to 59.9 in adult [facility]) 02/26/2020   Essential hypertension 02/26/2020   GERD without esophagitis 02/26/2020   Hidradenitis suppurativa 02/26/2020   OSA (obstructive sleep apnea) 02/26/2020   Prediabetes 02/26/2020     Assessment 35 yo male on apixaban for afib. He has acute HF and on CRRT and plans to start heparin. Last dose of apixaban given 7/29 ~ 10pm. Plans noted for DCCV on 8/1 -Hg= 11.7   Goal of Therapy:  Heparin level 0.3-0.7 units/ml aPTT 66-102 seconds Monitor platelets by anticoagulation protocol: Yes   Plan:  -No heparin bolus with recent apixaban -Start heparin at 1700 units/hr -aPTT in 8 hours -Daily aPTT, heparin level and CBC  Hildred Laser, PharmD Clinical Pharmacist **Pharmacist  phone directory can now be found on [email or web address] ([email or web address]).  Listed under [place].

## 2021-02-22 ENCOUNTER — Encounter (HOSPITAL_COMMUNITY): Payer: Self-pay | Admitting: Anesthesiology

## 2021-02-22 DIAGNOSIS — I4892 Unspecified atrial flutter: Secondary | ICD-10-CM | POA: Diagnosis not present

## 2021-02-22 DIAGNOSIS — I5023 Acute on chronic systolic (congestive) heart failure: Secondary | ICD-10-CM | POA: Diagnosis not present

## 2021-02-22 LAB — RENAL FUNCTION PANEL
Albumin: 3.8 g/dL (ref 3.5–5.0)
Albumin: 3.8 g/dL (ref 3.5–5.0)
Anion gap: 8 (ref 5–15)
Anion gap: 9 (ref 5–15)
BUN: 14 mg/dL (ref 6–20)
BUN: 16 mg/dL (ref 6–20)
CO2: 25 mmol/L (ref 22–32)
CO2: 24 mmol/L (ref 22–32)
Calcium: 9.2 mg/dL (ref 8.9–10.3)
Calcium: 9.2 mg/dL (ref 8.9–10.3)
Chloride: 96 mmol/L — ABNORMAL LOW (ref 98–111)
Chloride: 95 mmol/L — ABNORMAL LOW (ref 98–111)
Creatinine, Ser: 1.39 mg/dL — ABNORMAL HIGH (ref 0.61–1.24)
Creatinine, Ser: 1.45 mg/dL — ABNORMAL HIGH (ref 0.61–1.24)
GFR, Estimated: >60 mL/min (ref >60)
GFR, Estimated: >60 mL/min (ref >60)
Glucose, Bld: 114 mg/dL — ABNORMAL HIGH (ref 70–99)
Glucose, Bld: 197 mg/dL — ABNORMAL HIGH (ref 70–99)
Phosphorus: 3 mg/dL (ref 2.5–4.6)
Phosphorus: 2.5 mg/dL (ref 2.5–4.6)
Potassium: 4.6 mmol/L (ref 3.5–5.1)
Potassium: 4.4 mmol/L (ref 3.5–5.1)
Sodium: 129 mmol/L — ABNORMAL LOW (ref 135–145)
Sodium: 128 mmol/L — ABNORMAL LOW (ref 135–145)

## 2021-02-22 LAB — APTT
aPTT: 50 seconds — ABNORMAL HIGH (ref 24–36)
aPTT: 68 seconds — ABNORMAL HIGH (ref 24–36)

## 2021-02-22 LAB — CBC
HCT: 32.4 % — ABNORMAL LOW (ref 39.0–52.0)
HCT: 36.5 % — ABNORMAL LOW (ref 39.0–52.0)
Hemoglobin: 9.8 g/dL — ABNORMAL LOW (ref 13.0–17.0)
Hemoglobin: 11.5 g/dL — ABNORMAL LOW (ref 13.0–17.0)
MCH: 29.2 pg (ref 26.0–34.0)
MCH: 29.4 pg (ref 26.0–34.0)
MCHC: 30.2 g/dL (ref 30.0–36.0)
MCHC: 31.5 g/dL (ref 30.0–36.0)
MCV: 96.4 fL (ref 80.0–100.0)
MCV: 93.4 fL (ref 80.0–100.0)
Platelets: 210 10*3/uL (ref 150–400)
Platelets: 208 10*3/uL (ref 150–400)
RBC: 3.36 MIL/uL — ABNORMAL LOW (ref 4.22–5.81)
RBC: 3.91 MIL/uL — ABNORMAL LOW (ref 4.22–5.81)
RDW: 14.9 % (ref 11.5–15.5)
RDW: 14.6 % (ref 11.5–15.5)
WBC: 8 10*3/uL (ref 4.0–10.5)
WBC: 8.9 10*3/uL (ref 4.0–10.5)
nRBC: 0 % (ref 0.0–0.2)
nRBC: 0 % (ref 0.0–0.2)

## 2021-02-22 LAB — COOXEMETRY PANEL
Carboxyhemoglobin: 1.6 % — ABNORMAL HIGH (ref 0.5–1.5)
Methemoglobin: 0.7 % (ref 0.0–1.5)
O2 Saturation: 82.4 %
Total hemoglobin: 10.4 g/dL — ABNORMAL LOW (ref 12.0–16.0)

## 2021-02-22 LAB — HEPARIN LEVEL (UNFRACTIONATED): Heparin Unfractionated: 0.67 IU/mL (ref 0.30–0.70)

## 2021-02-22 LAB — MAGNESIUM: Magnesium: 2.6 mg/dL — ABNORMAL HIGH (ref 1.7–2.4)

## 2021-02-22 MED ORDER — MENTHOL 3 MG MT LOZG
1.0000 | LOZENGE | OROMUCOSAL | Status: DC | PRN
  Filled 2021-02-22: qty 9

## 2021-02-22 MED ORDER — PANTOPRAZOLE SODIUM 40 MG PO TBEC
40.0000 mg | DELAYED_RELEASE_TABLET | Freq: Every day | ORAL | Status: DC
  Administered 2021-02-22 – 2021-02-28 (×3): 40 mg via ORAL
  Filled 2021-02-22 (×6): qty 1

## 2021-02-22 NOTE — Progress Notes (Signed)
[place] for heparin  Indication: atrial fibrillation  Allergies  Allergen Reactions   Coreg [Carvedilol] Shortness Of Breath and Diarrhea    Wheezing     Patient Measurements: Height: '5\' 11"'$  (180.3 cm) Weight: (!) 203.4 kg (448 lb 6.7 oz) IBW/kg (Calculated) : 75.3 HEPARIN DW (KG): 130.5   Vital Signs: Temp: 97.5 F (36.4 C) (07/31 1600) Temp Source: Axillary (07/31 1600) BP: 145/112 (07/31 1600) Pulse Rate: 109 (07/31 1600)  Labs: Recent Labs    02/21/21 0522 02/21/21 1647 02/21/21 1837 02/22/21 0513 02/22/21 0617 02/22/21 0619 02/22/21 1559  HGB  --   --   --  9.8*  --  11.5*  --   HCT  --   --   --  32.4*  --  36.5*  --   PLT  --   --   --  210  --  208  --   APTT 27  --  45*  --  50*  --  68*  HEPARINUNFRC  --   --   --   --  0.67  --   --   CREATININE 1.30* 1.26*  --   --  1.39*  --   --      Estimated Creatinine Clearance: 132.7 mL/min (A) (by C-G formula based on SCr of 1.39 mg/dL (H)).   Medical History: Past Medical History:  Diagnosis Date   Biventricular congestive heart failure ([place])    Last Echo 11/2019 at [facility] reveals EF 20%   Class 3 severe obesity due to excess calories with serious comorbidity and body mass index (BMI) of 50.0 to 59.9 in adult [facility]) 02/26/2020   Essential hypertension 02/26/2020   GERD without esophagitis 02/26/2020   Hidradenitis suppurativa 02/26/2020   OSA (obstructive sleep apnea) 02/26/2020   Prediabetes 02/26/2020     Assessment 35 yo male on apixaban for afib. He has acute HF and on CRRT and plans to start heparin. Last dose of apixaban given 7/29 ~ 10pm. Plans noted for DCCV on 8/1  APTT this evening within goal range.  No overt bleeding or complications noted.  Goal of Therapy:  Heparin level 0.3-0.7 units/ml aPTT 66-102 seconds Monitor platelets by anticoagulation protocol: Yes   Plan:  -Continue IV heparin at 2400 units/hr. -aPTT in 8 hours -Daily aPTT, heparin  level and CBC  [place] Crane, Roylene Reason, [facility] Clinical Pharmacist  02/22/2021 4:50 PM   [facility] pharmacy phone numbers are listed on [email or web address]

## 2021-02-22 NOTE — Progress Notes (Signed)
Patient ID: Phillip Young, male   DOB: 1986/07/26, 35 y.o.   MRN: [id number]     Advanced Heart Failure Rounding Note  PCP-Cardiologist: None   Subjective:    Weight peaked 490--> 448 lbs today.   Still in AFL, V-rates 110s   On DBA 5+ NE 3. Co-ox 82%   Remains on CRRT pulling 150 per hour net. Less UOP over the last day (575 cc).  CVP still 18-19.   Has been up in chair.   Echo: EF < 20%, severe LV dilation, RV not well visualized.   Objective:   Weight Range: (!) 203.4 kg Body mass index is 62.54 kg/m.   Vital Signs:   Temp:  [97.3 F (36.3 C)-98 F (36.7 C)] 98 F (36.7 C) (07/30 2200) Pulse Rate:  [87-126] 126 (07/31 0700) Resp:  [23-37] 37 (07/30 2200) BP: (102-136)/(53-102) 131/94 (07/31 0600) SpO2:  [94 %-100 %] 96 % (07/31 0700) Weight:  [203.4 kg] 203.4 kg (07/31 0600) Last BM Date: 02/20/21  Weight change: Filed Weights   02/20/21 0556 02/21/21 0601 02/22/21 0600  Weight: (!) 207.6 kg (!) 205.3 kg (!) 203.4 kg    Intake/Output:   Intake/Output Summary (Last 24 hours) at 02/22/2021 0711 Last data filed at 02/22/2021 0700 Gross per 24 hour  Intake 2738.51 ml  Output 5411 ml  Net -2672.49 ml      Physical Exam   CVP 18-19  General: NAD Neck: Thick, JVP 16+, no thyromegaly or thyroid nodule.  Lungs: Clear to auscultation bilaterally with normal respiratory effort. CV: Nondisplaced PMI.  Heart tachy, regular S1/S2, no S3/S4, no murmur.  1+ ankle edema.  Abdomen: Soft, nontender, no hepatosplenomegaly, no distention.  Skin: Intact without lesions or rashes.  Neurologic: Alert and oriented x 3.  Psych: Normal affect. Extremities: No clubbing or cyanosis.  HEENT: Normal.    Telemetry   A flutter 110s (personally reviewed).   Labs    CBC Recent Labs    02/22/21 0513 02/22/21 0619  WBC 8.0 8.9  HGB 9.8* 11.5*  HCT 32.4* 36.5*  MCV 96.4 93.4  PLT 210 [id number]   Basic Metabolic Panel Recent Labs    02/21/21 0522 02/21/21 1647  02/22/21 0617  NA 128* 129* 129*  K 4.4 3.9 4.6  CL 95* 95* 96*  CO2 '26 22 25  '$ GLUCOSE 246* 298* 114*  BUN '11 12 14  '$ CREATININE 1.30* 1.26* 1.39*  CALCIUM 9.0 8.8* 9.2  MG 2.3  --  2.6*  PHOS 2.7 2.7 3.0   Liver Function Tests Recent Labs    02/21/21 1647 02/22/21 0617  ALBUMIN 3.5 3.8    No results for input(s): LIPASE, AMYLASE in the last 72 hours. Cardiac Enzymes No results for input(s): CKTOTAL, CKMB, CKMBINDEX, TROPONINI in the last 72 hours.  BNP: BNP (last 3 results) Recent Labs    10/10/20 1336 11/07/20 0932 02/11/21 1500  BNP 126.9* 106.0* 194.3*    ProBNP (last 3 results) No results for input(s): PROBNP in the last 8760 hours.   D-Dimer No results for input(s): DDIMER in the last 72 hours.  Hemoglobin A1C No results for input(s): HGBA1C in the last 72 hours.  Fasting Lipid Panel No results for input(s): CHOL, HDL, LDLCALC, TRIG, CHOLHDL, LDLDIRECT in the last 72 hours. Thyroid Function Tests No results for input(s): TSH, T4TOTAL, T3FREE, THYROIDAB in the last 72 hours.  Invalid input(s): FREET3   Other results:   Imaging    No results found.   Medications:  Scheduled Medications:  (feeding supplement) PROSource Plus  30 mL Oral TID BM   benzonatate  100 mg Oral BID   chlorhexidine  15 mL Mouth Rinse BID   Chlorhexidine Gluconate Cloth  6 each Topical Daily   mouth rinse  15 mL Mouth Rinse q12n4p   multivitamin  1 tablet Oral QHS   sodium chloride flush  10-40 mL Intracatheter Q12H   sodium chloride flush  3 mL Intravenous Q12H    Infusions:   prismasol BGK 4/2.5 400 mL/hr at 02/21/21 2219    prismasol BGK 4/2.5 200 mL/hr at 02/21/21 0358   sodium chloride     sodium chloride Stopped (02/16/21 2155)   amiodarone 30 mg/hr (02/22/21 0700)   DOBUTamine 5 mcg/kg/min (02/22/21 0700)   heparin 2,100 Units/hr (02/22/21 0700)   norepinephrine (LEVOPHED) Adult infusion 3 mcg/min (02/22/21 0700)   prismasol BGK 4/2.5 1,800 mL/hr  at 02/22/21 0626    PRN Medications: sodium chloride, acetaminophen **OR** acetaminophen, alteplase, alum & mag hydroxide-simeth, diphenhydrAMINE, guaiFENesin-dextromethorphan, heparin, heparin, magnesium hydroxide, sodium chloride, sodium chloride flush, sodium chloride flush  Assessment/Plan   1. Acute on Chronic Biventricular Heart Failure:  Known cardiomyopathy since 2019, previously followed by [facility].  Echo 02/27/2020 with EF < 20%, moderate LV dilation, severely decreased RV function with severe RV dilation, severe biatrial enlargement, mild MR. No history of ETOH/drugs.  No FH of cardiomyopathy (mother with [place]).  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  He is too large for cardiac MRI. RHC/LHC on 03/04/20 with no CAD (nonischemic cardiomyopathy), elevated left and right heart filling pressures, and low CI at 2.1. 05/2020 Echo showed EF 20-25%. Evaluated by EP but not a candidate for ICD given BMI.  Echo this admission with EF < 20%, RV poorly visualized. He now has CHF exacerbation likely in setting of AFL with RVR.  He was hypotensive with poor UOP, concern for cardiogenic shock.  Dobutamine therefore started at 2.5 and increased to 5, NE added at 3 currently.  With progressive renal failure, CVVH started.  This morning, co-ox 82%. Weight down another 4 lbs but CVP still 18-19 - Continue dobutamine 5 and NE 3.   - Continue CVVH for fluid offloading, pulling 150-200 cc/hr net (BP stable).   - Fluid restrict <1,800 ml/ day - With AKI/hypotension, stopped Entresto, dapagliflozin, spironolactone, digoxin.  - Need attempt at DCCV to restore NSR, once volume better. Possibly tomorrow.   - With marked obesity, he is not a candidate for advanced therapies.  2. Aflutter w/ RVR: Noted to be in Afib prior admit, 3/22, in setting of CAP and a/c CHF but converted to NSR on amiodarone. Discharged home w/ eliquis. In 2:1 AFL this admit in setting of a/c  CHF w/ marked volume overload, + recent gastroenteritis w/ diarrhea. Has OSA but not using CPAP. TSH WNL. Admits to intermittent compliance w/ Eliquis. Has missed several doses in the last month.  Now on amiodarone gtt 30 mg/hr with HR 110s. - Continue amiodarone at 30 mg/hr.  - Continue Eliquis 5 mg bid.  - Plan TEE/DCCV for Monday at 0730. 3. Acute Hypoxic Respiratory Failure: Suspect underlying OHS/OSA exacerbated by CHF this admission.  - Continue oxygen + CPAP.  4. Type 2 DM: Farxiga held with AKI.  5. OSA: Has not been on Bipap at home.  6. Super Morbid Obesity: Body mass index is 62.54 kg/m. 7. Tooth Abscess: Completed ceftriaxone. 8. UTI: Suspect by UA, sent urine culture.  ?Urinary  obstruction related to this => no hydronephrosis on renal US.  - Ceftriaxone IV course completed.  9. Elevated Hs Trop: Suspect demand ischemia in setting of CHF + rapid AFL. Cath 8/21 showed no CAD.  10. AKI: Creatinine peaked to 3.32 though BUN not markedly high.  Concern for cardiorenal syndrome.  Renal US with no hydronephrosis. Remains on CVVHD, pulling 150 cc/hr.  Not as much UOP over the last day. - Nephrology appreciated.  - UF 150-200 cc/hr today as long as BP continues to tolerate.   CRITICAL CARE Performed by: [name]  Total critical care time: 35 minutes  Critical care time was exclusive of separately billable procedures and treating other patients.  Critical care was necessary to treat or prevent imminent or life-threatening deterioration.  Critical care was time spent personally by me on the following activities: development of treatment plan with patient and/or surrogate as well as nursing, discussions with consultants, evaluation of patient's response to treatment, examination of patient, obtaining history from patient or surrogate, ordering and performing treatments and interventions, ordering and review of laboratory studies, ordering and review of radiographic studies, pulse  oximetry and re-evaluation of patient's condition.  Length of Stay: 56  [name], MD  02/22/2021, 7:11 AM  Advanced Heart Failure Team Pager [phone] (M-F; [place])  Please contact [facility] for night-coverage after hours (5p -7a ) and weekends on [email or web address]

## 2021-02-22 NOTE — Anesthesia Preprocedure Evaluation (Deleted)
Anesthesia Evaluation    Reviewed: Allergy & Precautions, H&P , Patient's Chart, lab work & pertinent test results  Airway        Dental   Pulmonary neg pulmonary ROS, sleep apnea , former smoker,           Cardiovascular Exercise Tolerance: Good hypertension, Pt. on medications +CHF  negative cardio ROS       Neuro/Psych negative neurological ROS  negative psych ROS   GI/Hepatic negative GI ROS, Neg liver ROS, GERD  ,  Endo/Other  negative endocrine ROSMorbid obesity  Renal/GU negative Renal ROS  negative genitourinary   Musculoskeletal   Abdominal   Peds  Hematology negative hematology ROS (+)   Anesthesia Other Findings   Reproductive/Obstetrics negative OB ROS                             Anesthesia Physical Anesthesia Plan  ASA: 4  Anesthesia Plan: General   Post-op Pain Management:    Induction: Intravenous  PONV Risk Score and Plan: 2 and Treatment may vary due to age or medical condition  Airway Management Planned: Oral ETT  Additional Equipment:   Intra-op Plan:   Post-operative Plan: Extubation in OR  Informed Consent:   Plan Discussed with:   Anesthesia Plan Comments:         Anesthesia Quick Evaluation

## 2021-02-22 NOTE — Progress Notes (Signed)
[place] for heparin  Indication: atrial fibrillation  Allergies  Allergen Reactions   Coreg [Carvedilol] Shortness Of Breath and Diarrhea    Wheezing     Patient Measurements: Height: '5\' 11"'$  (180.3 cm) Weight: (!) 203.4 kg (448 lb 6.7 oz) IBW/kg (Calculated) : 75.3 HEPARIN DW (KG): 130.5   Vital Signs: Temp: 98 F (36.7 C) (07/30 2200) BP: 131/94 (07/31 0600) Pulse Rate: 126 (07/31 0700)  Labs: Recent Labs    02/21/21 0522 02/21/21 1647 02/21/21 1837 02/22/21 0513 02/22/21 0617 02/22/21 0619  HGB  --   --   --  9.8*  --  11.5*  HCT  --   --   --  32.4*  --  36.5*  PLT  --   --   --  210  --  208  APTT 27  --  45*  --  50*  --   HEPARINUNFRC  --   --   --   --  0.67  --   CREATININE 1.30* 1.26*  --   --  1.39*  --      Estimated Creatinine Clearance: 132.7 mL/min (A) (by C-G formula based on SCr of 1.39 mg/dL (H)).   Medical History: Past Medical History:  Diagnosis Date   Biventricular congestive heart failure ([place])    Last Echo 11/2019 at [facility] reveals EF 20%   Class 3 severe obesity due to excess calories with serious comorbidity and body mass index (BMI) of 50.0 to 59.9 in adult [facility]) 02/26/2020   Essential hypertension 02/26/2020   GERD without esophagitis 02/26/2020   Hidradenitis suppurativa 02/26/2020   OSA (obstructive sleep apnea) 02/26/2020   Prediabetes 02/26/2020     Assessment 35 yo male on apixaban for afib. He has acute HF and on CRRT and plans to start heparin. Last dose of apixaban given 7/29 ~ 10pm. Plans noted for DCCV on 8/1 -aptt= 50, Hg= 11.5  APTT this evening below goal.  No overt bleeding or complications noted.  Goal of Therapy:  Heparin level 0.3-0.7 units/ml aPTT 66-102 seconds Monitor platelets by anticoagulation protocol: Yes   Plan:  -Increase IV heparin to 2400 units/hr. -aPTT in 8 hours -Daily aPTT, heparin level and CBC  Hildred Laser, PharmD Clinical Pharmacist **Pharmacist  phone directory can now be found on [email or web address] ([email or web address]).  Listed under [place].

## 2021-02-22 NOTE — Progress Notes (Signed)
[facility] Progress Note   35 y.o. year-old w/ hx of severe LV/ RV heart failure, super morbid obesity, HTN, OSA presented on 7/20 w/ SOB x 1 week, c/w CHF ->  IV lasix started and pt admitted. Atrial w/ RVR may have been cause of decomp CHF per cardiology team.  ECHO showed EF <20%. BP's dropped and pt moved to ICU for suspected cardiogenic shock. Started on IV dobutamine and norepi gtt's. CRRT started 7/24.  Assessment/ Plan:   AKI - creat 1.2 on admit, up to 3.3 in setting on cardiogenic shock, long-standing hx of severe bivent CHF, now w/ aflutter/ RVR.  Vol overload refractory to inotropes, pressors and IV lasix gtt ->  CRRT 7/24 ->    Seen on CRRT RIJ temp 400/200/1800 pre/post/qd Net UF 150-200 currently on levo, dobutamine and amio He was net neg 19 L by  I&O during hospitalization.  Filters clotting bec of frequent coughing; can UF 100-200 as long as UOP good and pressor req don't incr. UOP is trending down but still not in oliguric range -> doing better with systemic heparin with filter pressures that aren't as high.  He's markedly up on his EDW (402 lbs earlier in 2022); continue on CRRT with max UF as tolerated -> 448 lbs today.  Pt tolerating CVVHD.   CXR shows tip of  catheter in the innominate vein; flush and return are good through both ports.  CVP 17-19 -> likely will need to continue CRRT for another week -> ok to increase net UF to 200 ml/hr (UOP dropping off regardless).  Vol overload - diffuse pulm edema by CXR, on bipap, prob ascites on exam, ^^CVP (18) RV/ LV heart failure - per cardiology / CHF team -> waiting to optimize fluid status (markedly overloaded) prior to attempting DCCV. UTI on Ceftriaxone Morbid obesity. DM  Subjective:   Aflutter on amio, dobutamine + levophed and  off  lasix gtt,  but UOP starting to drop  (300-400 /24 hr). Mask uncomfortable so had been off mask but it's on ths AM. Coughing esp after eating which is chronic per  pt. Wt 486 lbs -> 479 -> 468 -> 460 -> 457 -> 452 -> 448.   Objective:   BP 130/81 (BP Location: Left Wrist)   Pulse (!) 111   Temp 97.8 F (36.6 C) (Axillary)   Resp (!) 24   Ht '5\' 11"'$  (1.803 m)   Wt (!) 203.4 kg   SpO2 97%   BMI 62.54 kg/m   Intake/Output Summary (Last 24 hours) at 02/22/2021 1012 Last data filed at 02/22/2021 0900 Gross per 24 hour  Intake 2742.76 ml  Output 5649 ml  Net -2906.24 ml   Weight change: -1.9 kg  Physical Exam: Gen alert, no distress, CPAP on Chest clear bilat to bases, no rales/ wheezing RRR no MRG Abd soft ntnd no mass or ascites +bs MS no joint effusions or deformity Ext 2+ UE > LE and abd pannus edema  Imaging: DG CHEST PORT 1 VIEW  Result Date: 02/20/2021 CLINICAL DATA:  Dialysis catheter placement EXAM: PORTABLE CHEST 1 VIEW COMPARISON:  02/15/2021 FINDINGS: Right jugular central venous catheter tip appears to be in the right innominate vein. This catheter appears to been exchanged since the prior study. No pneumothorax Cardiac enlargement with vascular congestion. Mild edema has improved in the interval. IMPRESSION: Central venous catheter tip right innominate vein.  No pneumothorax Improvement in pulmonary edema. Electronically Signed   By: Franchot Gallo M.D.  On: 02/20/2021 10:49    Labs: BMET Recent Labs  Lab 02/19/21 0515 02/19/21 1602 02/20/21 0518 02/20/21 1617 02/21/21 0522 02/21/21 1647 02/22/21 0617  NA 128* 133* 131* 131* 128* 129* 129*  K 4.1 4.5 4.3 4.2 4.4 3.9 4.6  CL 94* 98 98 100 95* 95* 96*  CO2 '26 28 27 27 26 22 25  '$ GLUCOSE 278* 158* 168* 179* 246* 298* 114*  BUN '11 10 12 11 11 12 14  '$ CREATININE 1.16 1.22 1.19 1.28* 1.30* 1.26* 1.39*  CALCIUM 8.7* 8.9 9.2 8.9 9.0 8.8* 9.2  PHOS 2.2* 3.1 2.7 2.4* 2.7 2.7 3.0   CBC Recent Labs  Lab 02/18/21 0447 02/19/21 0515 02/22/21 0513 02/22/21 0619  WBC 7.5 8.6 8.0 8.9  HGB 11.5* 11.4* 9.8* 11.5*  HCT 36.0* 36.2* 32.4* 36.5*  MCV 93.5 93.8 96.4 93.4  PLT  234 212 210 208    Medications:     (feeding supplement) PROSource Plus  30 mL Oral TID BM   benzonatate  100 mg Oral BID   chlorhexidine  15 mL Mouth Rinse BID   Chlorhexidine Gluconate Cloth  6 each Topical Daily   mouth rinse  15 mL Mouth Rinse q12n4p   multivitamin  1 tablet Oral QHS   pantoprazole  40 mg Oral Daily   sodium chloride flush  10-40 mL Intracatheter Q12H   sodium chloride flush  3 mL Intravenous Q12H      [name], MD 02/22/2021, 10:12 AM

## 2021-02-22 NOTE — H&P (Signed)
Pt places self on/off cpap when wanted.  Aware to call in he needs assistance.

## 2021-02-23 ENCOUNTER — Other Ambulatory Visit (HOSPITAL_COMMUNITY): Payer: BC Managed Care – PPO

## 2021-02-23 ENCOUNTER — Inpatient Hospital Stay (HOSPITAL_COMMUNITY): Payer: BC Managed Care – PPO

## 2021-02-23 ENCOUNTER — Encounter (HOSPITAL_COMMUNITY)
Admission: EM | Disposition: A | Discharge status: Home / Self Care | Payer: Self-pay | Source: Home / Self Care | Attending: Cardiology

## 2021-02-23 DIAGNOSIS — I5023 Acute on chronic systolic (congestive) heart failure: Secondary | ICD-10-CM | POA: Diagnosis not present

## 2021-02-23 DIAGNOSIS — I4892 Unspecified atrial flutter: Secondary | ICD-10-CM | POA: Diagnosis not present

## 2021-02-23 LAB — CBC
HCT: 29.3 % — ABNORMAL LOW (ref 39.0–52.0)
Hemoglobin: 8.9 g/dL — ABNORMAL LOW (ref 13.0–17.0)
MCH: 29.8 pg (ref 26.0–34.0)
MCHC: 30.4 g/dL (ref 30.0–36.0)
MCV: 98 fL (ref 80.0–100.0)
Platelets: 170 10*3/uL (ref 150–400)
RBC: 2.99 MIL/uL — ABNORMAL LOW (ref 4.22–5.81)
RDW: 14.7 % (ref 11.5–15.5)
WBC: 9.1 10*3/uL (ref 4.0–10.5)
nRBC: 0.2 % (ref 0.0–0.2)

## 2021-02-23 LAB — RENAL FUNCTION PANEL
Albumin: 4 g/dL (ref 3.5–5.0)
Albumin: 4 g/dL (ref 3.5–5.0)
Anion gap: 12 (ref 5–15)
Anion gap: 10 (ref 5–15)
BUN: 16 mg/dL (ref 6–20)
BUN: 16 mg/dL (ref 6–20)
CO2: 20 mmol/L — ABNORMAL LOW (ref 22–32)
CO2: 25 mmol/L (ref 22–32)
Calcium: 9.4 mg/dL (ref 8.9–10.3)
Calcium: 9.2 mg/dL (ref 8.9–10.3)
Chloride: 97 mmol/L — ABNORMAL LOW (ref 98–111)
Chloride: 92 mmol/L — ABNORMAL LOW (ref 98–111)
Creatinine, Ser: 1.69 mg/dL — ABNORMAL HIGH (ref 0.61–1.24)
Creatinine, Ser: 1.82 mg/dL — ABNORMAL HIGH (ref 0.61–1.24)
GFR, Estimated: 54 mL/min — ABNORMAL LOW (ref >60)
GFR, Estimated: 49 mL/min — ABNORMAL LOW (ref >60)
Glucose, Bld: 135 mg/dL — ABNORMAL HIGH (ref 70–99)
Glucose, Bld: 210 mg/dL — ABNORMAL HIGH (ref 70–99)
Phosphorus: 2.9 mg/dL (ref 2.5–4.6)
Phosphorus: 3 mg/dL (ref 2.5–4.6)
Potassium: 4.9 mmol/L (ref 3.5–5.1)
Potassium: 4.7 mmol/L (ref 3.5–5.1)
Sodium: 129 mmol/L — ABNORMAL LOW (ref 135–145)
Sodium: 127 mmol/L — ABNORMAL LOW (ref 135–145)

## 2021-02-23 LAB — COOXEMETRY PANEL
Carboxyhemoglobin: 1.4 % (ref 0.5–1.5)
Methemoglobin: 1.1 % (ref 0.0–1.5)
O2 Saturation: 59.8 %
Total hemoglobin: 11.7 g/dL — ABNORMAL LOW (ref 12.0–16.0)

## 2021-02-23 LAB — IRON AND TIBC
Iron: 29 ug/dL — ABNORMAL LOW (ref 45–182)
Saturation Ratios: 6 % — ABNORMAL LOW (ref 17.9–39.5)
TIBC: 491 ug/dL — ABNORMAL HIGH (ref 250–450)
UIBC: 462 ug/dL

## 2021-02-23 LAB — APTT
aPTT: 58 seconds — ABNORMAL HIGH (ref 24–36)
aPTT: 69 seconds — ABNORMAL HIGH (ref 24–36)
aPTT: 65 seconds — ABNORMAL HIGH (ref 24–36)

## 2021-02-23 LAB — MAGNESIUM: Magnesium: 2.5 mg/dL — ABNORMAL HIGH (ref 1.7–2.4)

## 2021-02-23 LAB — HEMOGLOBIN AND HEMATOCRIT, BLOOD
HCT: 35.3 % — ABNORMAL LOW (ref 39.0–52.0)
Hemoglobin: 11.2 g/dL — ABNORMAL LOW (ref 13.0–17.0)

## 2021-02-23 LAB — HEPARIN LEVEL (UNFRACTIONATED)
Heparin Unfractionated: 0.38 IU/mL (ref 0.30–0.70)
Heparin Unfractionated: 0.42 IU/mL (ref 0.30–0.70)

## 2021-02-23 LAB — C-REACTIVE PROTEIN: CRP: 13 mg/dL — ABNORMAL HIGH (ref <1.0)

## 2021-02-23 LAB — LACTIC ACID, PLASMA: Lactic Acid, Venous: 1.3 mmol/L (ref 0.5–1.9)

## 2021-02-23 LAB — FERRITIN: Ferritin: 82 ng/mL (ref 24–336)

## 2021-02-23 IMAGING — DX DG CHEST 1V PORT
1 series · 1 of 1 positions shown · non-contrast
Comparison: [DATE]

CLINICAL DATA: Shortness of breath, acute on chronic systolic CHF

EXAM:
PORTABLE CHEST 1 VIEW

[chest ap]
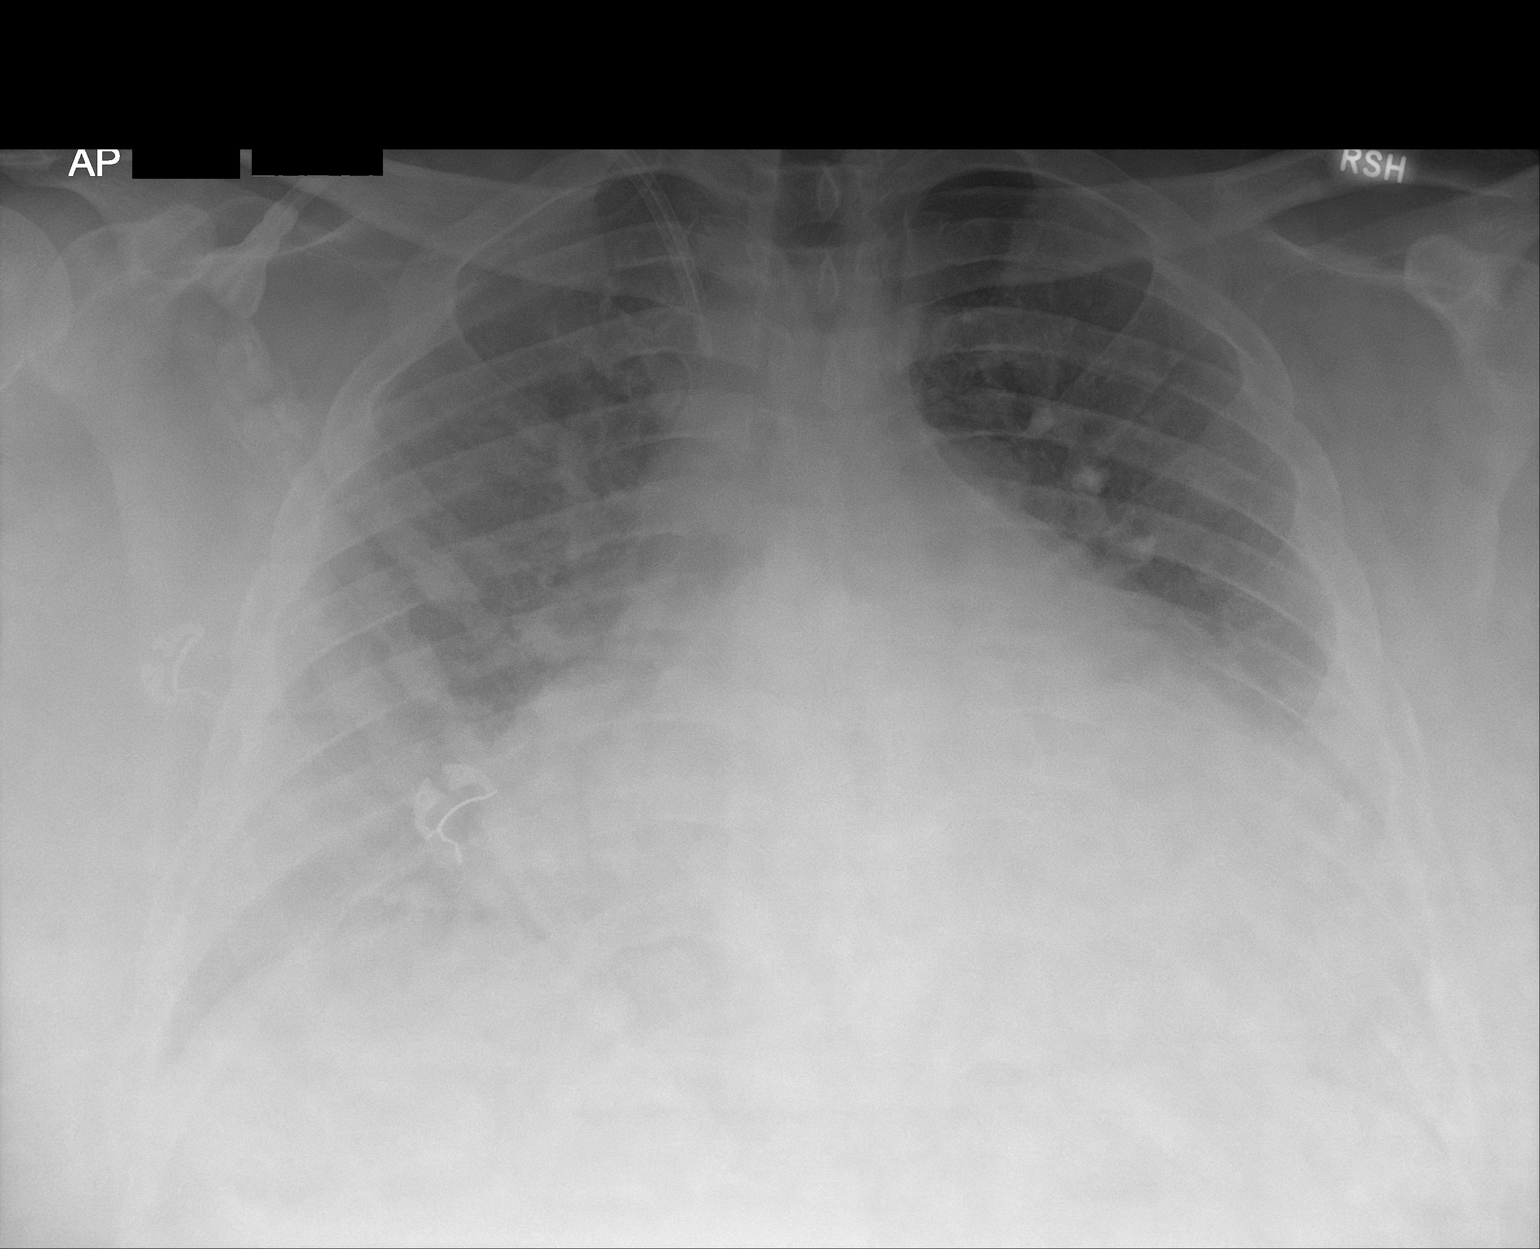

[1 of 1 positions shown; findings below may reference images not displayed]

FINDINGS: Right dialysis catheter remains in place, unchanged. Cardiomegaly
with vascular congestion and bilateral perihilar opacities and
interstitial prominence, increasing since prior study. No visible
effusions. No acute bony abnormality.
IMPRESSION: Worsening CHF.

## 2021-02-23 SURGERY — CANCELLED PROCEDURE

## 2021-02-23 MED ORDER — ALTEPLASE 2 MG IJ SOLR
2.0000 mg | Freq: Once | INTRAMUSCULAR | Status: AC
  Administered 2021-02-23: 2 mg
  Filled 2021-02-23: qty 2

## 2021-02-23 MED ORDER — DICYCLOMINE HCL 10 MG PO CAPS
10.0000 mg | ORAL_CAPSULE | Freq: Once | ORAL | Status: AC
  Administered 2021-02-23: 10 mg via ORAL
  Filled 2021-02-23: qty 1

## 2021-02-23 MED ORDER — ALTEPLASE 2 MG IJ SOLR: 2.0000 mg | Freq: Once | INTRAMUSCULAR | Status: AC

## 2021-02-23 MED ORDER — SENNOSIDES-DOCUSATE SODIUM 8.6-50 MG PO TABS
2.0000 | ORAL_TABLET | Freq: Two times a day (BID) | ORAL | Status: DC
  Administered 2021-02-23 – 2021-03-25 (×54): 2 via ORAL
  Filled 2021-02-23 (×55): qty 2

## 2021-02-23 MED ORDER — FUROSEMIDE 10 MG/ML IJ SOLN
160.0000 mg | Freq: Two times a day (BID) | INTRAVENOUS | Status: AC
  Administered 2021-02-23 (×2): 160 mg via INTRAVENOUS
  Filled 2021-02-23: qty 10
  Filled 2021-02-23: qty 2

## 2021-02-23 NOTE — Plan of Care (Signed)
  Problem: Education: Goal: Knowledge of General Education information will improve Description: Including pain rating scale, medication(s)/side effects and non-pharmacologic comfort measures Outcome: Not Progressing   Problem: Health Behavior/Discharge Planning: Goal: Ability to manage health-related needs will improve Outcome: Progressing   Problem: Clinical Measurements: Goal: Will remain free from infection Outcome: Progressing Goal: Diagnostic test results will improve Outcome: Progressing Goal: Cardiovascular complication will be avoided Outcome: Progressing

## 2021-02-23 NOTE — Significant Event (Signed)
I was notified by nursing that the patient was having significant abdominal pain.  Describes it as being cramping/aching primarily in the LUQ.  The pain started a few hours prior to my arrival.  I went to evaluate the patient and his VS were stable and he was only in mild distress.  His abdominal exam was negative for rebound tenderness or guarding.  He did have tenderness to palpation diffusely however.  Denies having any chest pain or vomiting.  Did endorse decreased appetite and nausea.  I checked a lactate and was 1.3 and CRP was 13.  Low concern for bowel ischemia at this time.  I started the patient on Senokot-S twice daily and gave Bentyl.  He is currently sleeping so presumably the Bentyl helped.  I ordered a KUB as well, but the patient declined given abdominal pain but was agreeable to getting in the morning.  He remains hemodynamically stable at this time.

## 2021-02-23 NOTE — TOC Progression Note (Signed)
Transition of Care (TOC) - Progression Note  Heart Failure   Patient Details  Name: Phillip Young MRN: [id number] Date of Birth: May 25, 1986  Transition of Care [facility]) CM/SW [place], [place] Phone Number: 02/23/2021, 3:25 PM  Clinical Narrative:    CSW met with the patient and his mother, [name] at bedside. [name] reported needing help with FMLA paperwork to send to his employer and needs it sent by tomorrow 02/24/2021. [name] asked the CSW to print off FMLA paperwork from the Department of Labor's website as he has not received anything from his employer even though he has asked them to send him the paperwork. [name] will need the MD to fill it out and send to his employer. CSW was able to get the FMLA paperwork to [name] and provided him with copies and suggested that he send an e-mail to his employer with the information as well to have for his records and [name] reported understanding.   CSW will continue to follow throughout discharge.   Expected Discharge Plan: Home/Self Care Barriers to Discharge: Continued Medical Work up  Expected Discharge Plan and Services Expected Discharge Plan: Home/Self Care In-house Referral: Clinical Social Work Discharge Planning Services: CM Consult, HF Clinic   Living arrangements for the past 2 months: Single Family Home                                       Social Determinants of Health (SDOH) Interventions Food Insecurity Interventions: Assist with [employer] Application Financial Strain Interventions: Other (Comment) (Referral to [facility]) Housing Interventions: Intervention Not Indicated Transportation Interventions: Intervention Not Indicated  Readmission Risk Interventions Readmission Risk Prevention Plan 10/16/2020 10/16/2020 10/14/2020  Transportation Screening Complete Complete Complete  PCP or Specialist Appt within 5-7 Days Complete Complete Complete  Home Care Screening Complete - -   Medication Review (RN CM) Referral to Pharmacy - -  Some recent data might be hidden   Haward Pope, MSW, [place] Heart Failure Social Worker

## 2021-02-23 NOTE — Progress Notes (Signed)
[place] for heparin  Indication: atrial fibrillation  Allergies  Allergen Reactions   Coreg [Carvedilol] Shortness Of Breath and Diarrhea    Wheezing     Patient Measurements: Height: '5\' 11"'$  (180.3 cm) Weight: (!) 203.4 kg (448 lb 6.7 oz) IBW/kg (Calculated) : 75.3 HEPARIN DW (KG): 130.5   Vital Signs: BP: 115/101 (08/01 0545) Pulse Rate: 113 (07/31 1900)  Labs: Recent Labs    02/21/21 1647 02/21/21 1837 02/22/21 0513 02/22/21 0617 02/22/21 0619 02/22/21 1559 02/23/21 0419  HGB  --    < > 9.8*  --  11.5*  --  8.9*  HCT  --   --  32.4*  --  36.5*  --  29.3*  PLT  --   --  210  --  208  --  170  APTT  --    < >  --  50*  --  68* 58*  HEPARINUNFRC  --   --   --  0.67  --   --  0.38  CREATININE 1.26*  --   --  1.39*  --  1.45*  --    < > = values in this interval not displayed.     Estimated Creatinine Clearance: 127.2 mL/min (A) (by C-G formula based on SCr of 1.45 mg/dL (H)).   Medical History: Past Medical History:  Diagnosis Date   Biventricular congestive heart failure ([place])    Last Echo 11/2019 at [facility] reveals EF 20%   Class 3 severe obesity due to excess calories with serious comorbidity and body mass index (BMI) of 50.0 to 59.9 in adult [facility]) 02/26/2020   Essential hypertension 02/26/2020   GERD without esophagitis 02/26/2020   Hidradenitis suppurativa 02/26/2020   OSA (obstructive sleep apnea) 02/26/2020   Prediabetes 02/26/2020     Assessment 35 yo male on apixaban for afib. He has acute HF and on CRRT and plans to start heparin. Last dose of apixaban given 7/29 ~ 10pm. Plans noted for DCCV on 8/1  8/1 AM update: aPTT low  Goal of Therapy:  Heparin level 0.3-0.7 units/ml aPTT 66-102 seconds Monitor platelets by anticoagulation protocol: Yes   Plan:  Inc heparin to 2600 units/hr 1400 aPTT and heparin level  Narda Bonds, PharmD, BCPS Clinical Pharmacist Phone: [phone]

## 2021-02-23 NOTE — Progress Notes (Addendum)
Patient ID: Phillip Young, male   DOB: 1985-12-25, 35 y.o.   MRN: [id number] P    Advanced Heart Failure Rounding Note  PCP-Cardiologist: None   Subjective:    Weight peaked 490--> 442 lbs today.   Still in AFL, V-rates 110s   On DBA 5 + NE 3. Co-ox 60%   Remains on CRRT pulling 200 per hour net. Less UOP over the last day (450 cc).  CVP still at least 18.   Has been up in chair. Has a lot of coughing this morning, cannot lie flat.   Echo: EF < 20%, severe LV dilation, RV not well visualized.   Objective:   Weight Range: (!) 200.9 kg Body mass index is 61.77 kg/m.   Vital Signs:   Temp:  [97.3 F (36.3 C)-97.8 F (36.6 C)] 97.5 F (36.4 C) (07/31 1600) Pulse Rate:  [109-120] 113 (07/31 1900) Resp:  [19-36] 33 (08/01 0600) BP: (88-145)/(53-112) 102/71 (08/01 0600) SpO2:  [92 %-100 %] 98 % (08/01 0545) Weight:  [200.9 kg] 200.9 kg (08/01 0630) Last BM Date: 02/20/21  Weight change: Filed Weights   02/21/21 0601 02/22/21 0600 02/23/21 0630  Weight: (!) 205.3 kg (!) 203.4 kg (!) 200.9 kg    Intake/Output:   Intake/Output Summary (Last 24 hours) at 02/23/2021 0745 Last data filed at 02/23/2021 0700 Gross per 24 hour  Intake 2779.98 ml  Output 7074 ml  Net -4294.02 ml      Physical Exam   CVP 18-20 General: NAD Neck: JVP 16+, no thyromegaly or thyroid nodule.  Lungs: Clear to auscultation bilaterally with normal respiratory effort. CV: Nondisplaced PMI.  Heart tachy regular S1/S2, no S3/S4, no murmur.  Trace ankle edema.  Abdomen: Soft, nontender, no hepatosplenomegaly, no distention.  Skin: Intact without lesions or rashes.  Neurologic: Alert and oriented x 3.  Psych: Normal affect. Extremities: No clubbing or cyanosis.  HEENT: Normal.    Telemetry   A flutter 110s (personally reviewed).   Labs    CBC Recent Labs    02/22/21 0619 02/23/21 0419  WBC 8.9 9.1  HGB 11.5* 8.9*  HCT 36.5* 29.3*  MCV 93.4 98.0  PLT 208 [id number]   Basic Metabolic  Panel Recent Labs    02/22/21 0617 02/22/21 1559 02/23/21 0540  NA 129* 128* 129*  K 4.6 4.4 4.9  CL 96* 95* 97*  CO2 25 24 20*  GLUCOSE 114* 197* 135*  BUN '14 16 16  '$ CREATININE 1.39* 1.45* 1.69*  CALCIUM 9.2 9.2 9.4  MG 2.6*  --  2.5*  PHOS 3.0 2.5 2.9   Liver Function Tests Recent Labs    02/22/21 1559 02/23/21 0540  ALBUMIN 3.8 4.0    No results for input(s): LIPASE, AMYLASE in the last 72 hours. Cardiac Enzymes No results for input(s): CKTOTAL, CKMB, CKMBINDEX, TROPONINI in the last 72 hours.  BNP: BNP (last 3 results) Recent Labs    10/10/20 1336 11/07/20 0932 02/11/21 1500  BNP 126.9* 106.0* 194.3*    ProBNP (last 3 results) No results for input(s): PROBNP in the last 8760 hours.   D-Dimer No results for input(s): DDIMER in the last 72 hours.  Hemoglobin A1C No results for input(s): HGBA1C in the last 72 hours.  Fasting Lipid Panel No results for input(s): CHOL, HDL, LDLCALC, TRIG, CHOLHDL, LDLDIRECT in the last 72 hours. Thyroid Function Tests No results for input(s): TSH, T4TOTAL, T3FREE, THYROIDAB in the last 72 hours.  Invalid input(s): FREET3   Other results:  Imaging    No results found.   Medications:     Scheduled Medications:  (feeding supplement) PROSource Plus  30 mL Oral TID BM   benzonatate  100 mg Oral BID   chlorhexidine  15 mL Mouth Rinse BID   Chlorhexidine Gluconate Cloth  6 each Topical Daily   mouth rinse  15 mL Mouth Rinse q12n4p   multivitamin  1 tablet Oral QHS   pantoprazole  40 mg Oral Daily   sodium chloride flush  10-40 mL Intracatheter Q12H   sodium chloride flush  3 mL Intravenous Q12H    Infusions:   prismasol BGK 4/2.5 400 mL/hr at 02/23/21 0058    prismasol BGK 4/2.5 200 mL/hr at 02/22/21 0715   sodium chloride     sodium chloride Stopped (02/16/21 2155)   amiodarone 30 mg/hr (02/23/21 0700)   DOBUTamine 5 mcg/kg/min (02/23/21 0700)   heparin 2,400 Units/hr (02/23/21 0700)   norepinephrine  (LEVOPHED) Adult infusion 3 mcg/min (02/23/21 0700)   prismasol BGK 4/2.5 1,800 mL/hr at 02/23/21 0610    PRN Medications: sodium chloride, acetaminophen **OR** acetaminophen, alteplase, alum & mag hydroxide-simeth, diphenhydrAMINE, guaiFENesin-dextromethorphan, heparin, heparin, magnesium hydroxide, menthol-cetylpyridinium, sodium chloride, sodium chloride flush, sodium chloride flush  Assessment/Plan   1. Acute on Chronic Biventricular Heart Failure:  Known cardiomyopathy since 2019, previously followed by [facility].  Echo 02/27/2020 with EF < 20%, moderate LV dilation, severely decreased RV function with severe RV dilation, severe biatrial enlargement, mild MR. No history of ETOH/drugs.  No FH of cardiomyopathy (mother with [place]).  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  He is too large for cardiac MRI. RHC/LHC on 03/04/20 with no CAD (nonischemic cardiomyopathy), elevated left and right heart filling pressures, and low CI at 2.1. 05/2020 Echo showed EF 20-25%. Evaluated by EP but not a candidate for ICD given BMI.  Echo this admission with EF < 20%, RV poorly visualized. He now has CHF exacerbation likely in setting of AFL with RVR.  He was hypotensive with poor UOP, concern for cardiogenic shock.  Dobutamine therefore started at 2.5 and increased to 5, NE added at 3 currently.  With progressive renal failure, CVVH started.  This morning, co-ox 60%. Weight down another 6 lbs but CVP still at least 18.  - Continue dobutamine 5 and NE 3.   - Continue CVVH for fluid offloading, aim for 150 cc/hr net today (BP stable).   - Fluid restrict <1,800 ml/ day - With AKI/hypotension, stopped Entresto, dapagliflozin, spironolactone, digoxin.  - Need attempt at DCCV to restore NSR, once volume better. Not today as he is coughing a lot and cannot lie down.  - With marked obesity, he is not a candidate for advanced therapies.  2. Aflutter w/ RVR: Noted to be in  Afib prior admit, 3/22, in setting of CAP and a/c CHF but converted to NSR on amiodarone. Discharged home w/ eliquis. In 2:1 AFL this admit in setting of a/c CHF w/ marked volume overload, + recent gastroenteritis w/ diarrhea. Has OSA but not using CPAP. TSH WNL. Admits to intermittent compliance w/ Eliquis. Has missed several doses in the last month.  Now on amiodarone gtt 30 mg/hr with HR 110s. - Continue amiodarone at 30 mg/hr.  - Continue Eliquis 5 mg bid.  - Plan TEE/DCCV when he can lie back and coughing controlled.  3. Acute Hypoxic Respiratory Failure: Suspect underlying OHS/OSA exacerbated by CHF this admission.  - Continue oxygen + CPAP.  4. Type  2 DM: Wilder Glade held with AKI.  5. OSA: Has not been on Bipap at home.  6. Super Morbid Obesity: Body mass index is 61.77 kg/m. 7. Tooth Abscess: Completed ceftriaxone. 8. UTI: Suspect by UA, sent urine culture.  ?Urinary obstruction related to this => no hydronephrosis on renal US.  - Ceftriaxone IV course completed.  9. Elevated Hs Trop: Suspect demand ischemia in setting of CHF + rapid AFL. Cath 8/21 showed no CAD.  10. AKI: Creatinine peaked to 3.32 though BUN not markedly high.  Concern for cardiorenal syndrome.  Renal US with no hydronephrosis. Remains on CVVHD, pulling 200 cc/hr.  Not as much UOP over the last day. - Nephrology appreciated.  - UF 150 today as long as BP continues to tolerate.  11. Anemia: Drop in hgb to 8.9.  - Recheck CBC - FOBC - Fe, TIBC, ferritin 12. Cough - symptomatic tx - pCXR now.   CRITICAL CARE Performed by: [name]  Total critical care time: 35 minutes  Critical care time was exclusive of separately billable procedures and treating other patients.  Critical care was necessary to treat or prevent imminent or life-threatening deterioration.  Critical care was time spent personally by me on the following activities: development of treatment plan with patient and/or surrogate as well as nursing,  discussions with consultants, evaluation of patient's response to treatment, examination of patient, obtaining history from patient or surrogate, ordering and performing treatments and interventions, ordering and review of laboratory studies, ordering and review of radiographic studies, pulse oximetry and re-evaluation of patient's condition.  Length of Stay: 74  [name], MD  02/23/2021, 7:45 AM  Advanced Heart Failure Team Pager [phone] (M-F; 7a - 5p)  Please contact [facility] for night-coverage after hours (5p -7a ) and weekends on [email or web address]

## 2021-02-23 NOTE — Progress Notes (Signed)
PT Cancellation Note  Patient Details Name: Phillip Young MRN: [id number] DOB: 1986/04/03   Cancelled Treatment:    Reason Eval/Treat Not Completed: Patient declined, no reason specified (pt sitting in short back chair on arrival with limited attention due to frequently falling asleep during conversation. Pt refused repositioning to bed or recliner for safety while sleeping. Pt also stating he is refusing any P.T. while on CRRT. Pt educated for role and purpose of therapy and stated he will perform tranfers on CRRT but no therapy. Will follow from a distance until off CRRT)   [name] B [name] 02/23/2021, 9:54 AM Bayard Males, PT Acute Rehabilitation Services Pager: [phone] Office: [phone]

## 2021-02-23 NOTE — Progress Notes (Addendum)
[facility] Progress Note   35 y.o. year-old w/ hx of severe LV/ RV heart failure, super morbid obesity, HTN, OSA presented on 7/20 w/ SOB x 1 week, c/w CHF ->  IV lasix started and pt admitted. Atrial w/ RVR may have been cause of decomp CHF per cardiology team.  ECHO showed EF <20%. BP's dropped and pt moved to ICU for suspected cardiogenic shock. Started on IV dobutamine and norepi gtt's. CRRT started 7/24.  Assessment/ Plan:   AKI - creat 1.2 on admit, up to 3.3 in setting on cardiogenic shock, long-standing hx of severe bivent CHF, now w/ aflutter/ RVR.  Vol overload refractory to inotropes, pressors and IV lasix gtt ->  CRRT 7/24 ->    Seen on CRRT RIJ temp 400/200/1800 pre/post/qd Net UF 150-200 currently on Levophed and dobutamine Continues to be net negative daily  Filter clotting improved with heparin.  Try addition of diuretics to aid in fluid removal and judge kidneys response.  Vol overload -related to heart failure.  Volume removal as above. RV/ LV heart failure - per cardiology / CHF team -> waiting to optimize fluid status (markedly overloaded) prior to attempting DCCV.  Continue dobutamine and norepinephrine per primary team.  Holding home medications because of hypotension. UTI status post ceftriaxone Morbid obesity. DM -management per primary Hyponatremia: Associated with fluid overload.  Continue CRRT as above Anemia: Hemoglobin 8.9 today.  Iron deficiency noted on labs.  Would benefit from IV iron  Subjective:   Continues to have SVT. Frustrated he isn't peeing yet. Wants to try diuretics. Feels nauseated. Poor appetite  Continues on CRRT.  -4 L yesterday.  450 cc of urine output.  Continues on dobutamine and norepinephrine.   Objective:   BP 102/71   Pulse (!) 113   Temp (!) 97.5 F (36.4 C) (Axillary)   Resp (!) 33   Ht '5\' 11"'$  (1.803 m)   Wt (!) 200.9 kg   SpO2 98%   BMI 61.77 kg/m   Intake/Output Summary (Last 24 hours) at 02/23/2021  0737 Last data filed at 02/23/2021 0700 Gross per 24 hour  Intake 2779.98 ml  Output 7274 ml  Net -4494.02 ml   Weight change: -2.5 kg  Physical Exam: Gen sitting in chair, morbidly obese, nasal cannula Chest bilateral chest rise, distant lung sounds, no rales/ wheezing RRR tachycardia, regular rhythm Abd soft ntnd no mass or ascites +bs MS no joint effusions or deformity Ext 2+ bilateral lower extremity edema, edema pannus  Imaging: No results found.  Labs: BMET Recent Labs  Lab 02/20/21 0518 02/20/21 1617 02/21/21 0522 02/21/21 1647 02/22/21 0617 02/22/21 1559 02/23/21 0540  NA 131* 131* 128* 129* 129* 128* 129*  K 4.3 4.2 4.4 3.9 4.6 4.4 4.9  CL 98 100 95* 95* 96* 95* 97*  CO2 '27 27 26 22 25 24 '$ 20*  GLUCOSE 168* 179* 246* 298* 114* 197* 135*  BUN '12 11 11 12 14 16 16  '$ CREATININE 1.19 1.28* 1.30* 1.26* 1.39* 1.45* 1.69*  CALCIUM 9.2 8.9 9.0 8.8* 9.2 9.2 9.4  PHOS 2.7 2.4* 2.7 2.7 3.0 2.5 2.9   CBC Recent Labs  Lab 02/19/21 0515 02/22/21 0513 02/22/21 0619 02/23/21 0419  WBC 8.6 8.0 8.9 9.1  HGB 11.4* 9.8* 11.5* 8.9*  HCT 36.2* 32.4* 36.5* 29.3*  MCV 93.8 96.4 93.4 98.0  PLT 212 210 208 170    Medications:     (feeding supplement) PROSource Plus  30 mL Oral TID BM  benzonatate  100 mg Oral BID   chlorhexidine  15 mL Mouth Rinse BID   Chlorhexidine Gluconate Cloth  6 each Topical Daily   mouth rinse  15 mL Mouth Rinse q12n4p   multivitamin  1 tablet Oral QHS   pantoprazole  40 mg Oral Daily   sodium chloride flush  10-40 mL Intracatheter Q12H   sodium chloride flush  3 mL Intravenous Q12H      Reesa Chew  02/23/2021, 7:37 AM

## 2021-02-23 NOTE — Progress Notes (Signed)
Pt places self on/off cpap when ready.  RT available if needed.

## 2021-02-23 NOTE — Progress Notes (Addendum)
ANTICOAGULATION CONSULT NOTE- Follow-Up Note  Pharmacy Consult for Heparin  Indication: atrial fibrillation  Patient Measurements: Height: '5\' 11"'$  (180.3 cm) Weight: (!) 200.9 kg (442 lb 14.5 oz) IBW/kg (Calculated) : 75.3 HEPARIN DW (KG): 130.5   Vital Signs: Temp: 97.4 F (36.3 C) (08/01 0800) Temp Source: Oral (08/01 0800) BP: 102/71 (08/01 0600)  Labs: Recent Labs    02/22/21 0513 02/22/21 0617 02/22/21 0619 02/22/21 1559 02/23/21 0419 02/23/21 0540 02/23/21 1441  HGB 9.8*  --  11.5*  --  8.9*  --  11.2*  HCT 32.4*  --  36.5*  --  29.3*  --  35.3*  PLT 210  --  208  --  170  --   --   APTT  --  50*  --  68* 58*  --  69*  HEPARINUNFRC  --  0.67  --   --  0.38  --  0.42  CREATININE  --  1.39*  --  1.45*  --  1.69*  --      Estimated Creatinine Clearance: 108.3 mL/min (A) (by C-G formula based on SCr of 1.69 mg/dL (H)).    Assessment 35 yo male on apixaban for afib. He has acute HF and on CRRT and plans to start heparin. Last dose of apixaban given 7/29 ~ 10pm.   aPTT this evening is therapeutic (aPTT 69, goal of 66-102). Heparin level close to correlating -  perhaps with AM labs.   Addendum: Confirmatory level this evening is slightly SUBtherapeutic (aPTT 65). No bleeding or infusion issues noted. Will increase Heparin to 2800 units/hr and f/u levels in the AM  Alycia Rossetti, PharmD, BCPS 02/23/2021 10:06 PM   Goal of Therapy:  Heparin level 0.3-0.7 units/ml aPTT 66-102 seconds Monitor platelets by anticoagulation protocol: Yes   Plan:  - Continue Heparin at 2600 units/hr - Will continue to monitor for any signs/symptoms of bleeding and will follow up with aPTT in 6 hours to confirm  Thank you for allowing pharmacy to be a part of this patient's care.  Alycia Rossetti, PharmD, BCPS Clinical Pharmacist Clinical phone for 02/23/2021: [phone] 02/23/2021 4:14 PM   **Pharmacist phone directory can now be found on [email or web address] ([email or web address]).  Listed under [place].

## 2021-02-24 DIAGNOSIS — I5023 Acute on chronic systolic (congestive) heart failure: Secondary | ICD-10-CM | POA: Diagnosis not present

## 2021-02-24 DIAGNOSIS — I4892 Unspecified atrial flutter: Secondary | ICD-10-CM | POA: Diagnosis not present

## 2021-02-24 LAB — RENAL FUNCTION PANEL
Albumin: 3.6 g/dL (ref 3.5–5.0)
Albumin: 3.4 g/dL — ABNORMAL LOW (ref 3.5–5.0)
Anion gap: 10 (ref 5–15)
Anion gap: 13 (ref 5–15)
BUN: 17 mg/dL (ref 6–20)
BUN: 19 mg/dL (ref 6–20)
CO2: 23 mmol/L (ref 22–32)
CO2: 20 mmol/L — ABNORMAL LOW (ref 22–32)
Calcium: 8.9 mg/dL (ref 8.9–10.3)
Calcium: 8.7 mg/dL — ABNORMAL LOW (ref 8.9–10.3)
Chloride: 90 mmol/L — ABNORMAL LOW (ref 98–111)
Chloride: 87 mmol/L — ABNORMAL LOW (ref 98–111)
Creatinine, Ser: 2.07 mg/dL — ABNORMAL HIGH (ref 0.61–1.24)
Creatinine, Ser: 2.31 mg/dL — ABNORMAL HIGH (ref 0.61–1.24)
GFR, Estimated: 42 mL/min — ABNORMAL LOW (ref >60)
GFR, Estimated: 37 mL/min — ABNORMAL LOW (ref >60)
Glucose, Bld: 259 mg/dL — ABNORMAL HIGH (ref 70–99)
Glucose, Bld: 381 mg/dL — ABNORMAL HIGH (ref 70–99)
Phosphorus: 2.7 mg/dL (ref 2.5–4.6)
Phosphorus: 3 mg/dL (ref 2.5–4.6)
Potassium: 4.7 mmol/L (ref 3.5–5.1)
Potassium: 4.3 mmol/L (ref 3.5–5.1)
Sodium: 123 mmol/L — ABNORMAL LOW (ref 135–145)
Sodium: 120 mmol/L — ABNORMAL LOW (ref 135–145)

## 2021-02-24 LAB — COOXEMETRY PANEL
Carboxyhemoglobin: 1.6 % — ABNORMAL HIGH (ref 0.5–1.5)
Carboxyhemoglobin: 1.5 % (ref 0.5–1.5)
Methemoglobin: 0.9 % (ref 0.0–1.5)
Methemoglobin: 1.3 % (ref 0.0–1.5)
O2 Saturation: 61.9 %
O2 Saturation: 40.8 %
Total hemoglobin: 11.9 g/dL — ABNORMAL LOW (ref 12.0–16.0)
Total hemoglobin: 10.2 g/dL — ABNORMAL LOW (ref 12.0–16.0)

## 2021-02-24 LAB — CBC
HCT: 36.6 % — ABNORMAL LOW (ref 39.0–52.0)
Hemoglobin: 11.4 g/dL — ABNORMAL LOW (ref 13.0–17.0)
MCH: 28.9 pg (ref 26.0–34.0)
MCHC: 31.1 g/dL (ref 30.0–36.0)
MCV: 92.9 fL (ref 80.0–100.0)
Platelets: 245 10*3/uL (ref 150–400)
RBC: 3.94 MIL/uL — ABNORMAL LOW (ref 4.22–5.81)
RDW: 14.6 % (ref 11.5–15.5)
WBC: 14.9 10*3/uL — ABNORMAL HIGH (ref 4.0–10.5)
nRBC: 0 % (ref 0.0–0.2)

## 2021-02-24 LAB — MAGNESIUM: Magnesium: 2.3 mg/dL (ref 1.7–2.4)

## 2021-02-24 LAB — APTT: aPTT: 63 seconds — ABNORMAL HIGH (ref 24–36)

## 2021-02-24 LAB — PROTIME-INR
INR: 1.4 — ABNORMAL HIGH (ref 0.8–1.2)
Prothrombin Time: 17.3 seconds — ABNORMAL HIGH (ref 11.4–15.2)

## 2021-02-24 MED ORDER — SODIUM CHLORIDE 0.9 % IV SOLN
250.0000 mg | Freq: Every day | INTRAVENOUS | Status: AC
  Administered 2021-02-24 – 2021-02-27 (×4): 250 mg via INTRAVENOUS
  Filled 2021-02-24 (×4): qty 20

## 2021-02-24 MED ORDER — ONDANSETRON HCL 4 MG/2ML IJ SOLN
4.0000 mg | Freq: Four times a day (QID) | INTRAMUSCULAR | Status: DC | PRN
  Administered 2021-02-26 – 2021-03-25 (×9): 4 mg via INTRAVENOUS
  Filled 2021-02-24 (×9): qty 2

## 2021-02-24 MED ORDER — ALTEPLASE 2 MG IJ SOLR: 2.0000 mg | Freq: Once | INTRAMUSCULAR | Status: DC

## 2021-02-24 MED ORDER — AMIODARONE LOAD VIA INFUSION
150.0000 mg | Freq: Once | INTRAVENOUS | Status: AC
  Administered 2021-02-24: 150 mg via INTRAVENOUS
  Filled 2021-02-24: qty 83.34

## 2021-02-24 MED ORDER — MIDODRINE HCL 5 MG PO TABS
5.0000 mg | ORAL_TABLET | Freq: Three times a day (TID) | ORAL | Status: DC
  Administered 2021-02-24: 5 mg via ORAL
  Filled 2021-02-24: qty 1

## 2021-02-24 MED ORDER — SODIUM CHLORIDE 0.9 % IV SOLN: INTRAVENOUS | Status: DC

## 2021-02-24 NOTE — Progress Notes (Addendum)
   Called by nursing staff for A flutter 2:1 rate 220s.   Respiratory rate in the 30s on 6 liters. CRRT on hold  Remains on norepi  3  dobutamine 5 mcg.   Amio increased to 60 mg per hour and given 150 mg bolus with rate back down A flutter 2:1 108   Complaining of dyspnea at rest. Sitting on the side of bed. Moved to the chair.   CCM consulted. Concerned he cant protect his airway. Dr [name] aware and at the bedside.   Amy Clegg  NP-C  11:40 AM   CO-OX 40%. Increase dobutamine 7.5 mcg.   Moved back to the bed. Consult palliative care.   Amy Clegg NP-C  11:54 AM  Patient seen with NP, agree with the above note.   Patient became more short of breath, HR up to 200s atrial flutter x 2.  Resolved with amiodarone bolus, increased to 60 mg/hr.    Co-ox 40%, increase dobutamine to 7.5 for now.    MAP now stable, starting Bipap.  Will continue to pull CVVH 150-200/hr for increased dyspnea.   I discussed code status with him.  He is thinking about it but still wants full code.  I told him that we would have a really hard time getting him off the vent if we had to intubate him. Will involve palliative care.   I do not think we will be able to cardiovert him at this point without intubating (was scheduled for tomorrow).  Will see if we can do this in the future with more fluid off on CVVH.   CRITICAL CARE Performed by: [name]  Total critical care time: 30 minutes  Critical care time was exclusive of separately billable procedures and treating other patients.  Critical care was necessary to treat or prevent imminent or life-threatening deterioration.  Critical care was time spent personally by me on the following activities: development of treatment plan with patient and/or surrogate as well as nursing, discussions with consultants, evaluation of patient's response to treatment, examination of patient, obtaining history from patient or surrogate, ordering and performing  treatments and interventions, ordering and review of laboratory studies, ordering and review of radiographic studies, pulse oximetry and re-evaluation of patient's condition.  [name] 02/24/2021 12:06 PM

## 2021-02-24 NOTE — Progress Notes (Signed)
ANTICOAGULATION CONSULT NOTE- Follow-Up Note  Pharmacy Consult for Heparin  Indication: atrial fibrillation  Patient Measurements: Height: '5\' 11"'$  (180.3 cm) Weight: (!) 200.9 kg (442 lb 14.5 oz) IBW/kg (Calculated) : 75.3 HEPARIN DW (KG): 130.5   Vital Signs: Temp: 98.2 F (36.8 C) (08/02 0400) Temp Source: Oral (08/02 0400) BP: 110/92 (08/02 0700) Pulse Rate: 115 (08/02 0700)  Labs: Recent Labs    02/22/21 0617 02/22/21 [id number] 02/22/21 1559 02/23/21 0419 02/23/21 0540 02/23/21 1441 02/23/21 1600 02/23/21 2119 02/24/21 0535 02/24/21 0700  HGB  --  11.5*  --  8.9*  --  11.2*  --   --  11.4*  --   HCT  --  36.5*  --  29.3*  --  35.3*  --   --  36.6*  --   PLT  --  208  --  170  --   --   --   --  245  --   APTT 50*  --    < > 58*  --  69*  --  65*  --  63*  HEPARINUNFRC 0.67  --   --  0.38  --  0.42  --   --   --   --   CREATININE 1.39*  --    < >  --  1.69*  --  1.82*  --  2.07*  --    < > = values in this interval not displayed.     Estimated Creatinine Clearance: 88.4 mL/min (A) (by C-G formula based on SCr of 2.07 mg/dL (H)).    Assessment 35 yo male on apixaban for afib. He has acute HF and on CRRT and plans to start heparin. Last dose of apixaban given 7/29 ~ 10pm.   aPTT is slightly below goal at 63 seconds, CBC stable, no heparin level this morning.  Goal of Therapy:  Heparin level 0.3-0.7 units/ml aPTT 66-102 seconds Monitor platelets by anticoagulation protocol: Yes   Plan:  -Increase heparin to 2900 units/h -Daily aPTT and heparin level   Arrie Senate, PharmD, BCPS, [facility] Clinical Pharmacist [phone] Please check AMION for all [place] numbers 02/24/2021

## 2021-02-24 NOTE — Progress Notes (Signed)
Consult to de-clot PICC: Caps changed/flushed red and grey lumen. Brisk blood return noted from both. Alteplase order canceled.

## 2021-02-24 NOTE — Progress Notes (Signed)
Patient ID: Phillip Young, male   DOB: 05/18/1986, 35 y.o.   MRN: [id number]     Advanced Heart Failure Rounding Note  PCP-Cardiologist: None   Subjective:    Still in AFL, V-rates 110s   On DBA 5 + NE 3. Co-ox 62%   Remains on CRRT pulling 150 cc per hour net. Less UOP over the last day (300 cc).  CVP 17.    Has been up in chair. Coughing less.  Frustrated with the whole process but understands.    Echo: EF < 20%, severe LV dilation, RV not well visualized.   Objective:   Weight Range: (!) 200.9 kg Body mass index is 61.77 kg/m.   Vital Signs:   Temp:  [97.4 F (36.3 C)-98.4 F (36.9 C)] 98.2 F (36.8 C) (08/02 0400) Pulse Rate:  [89-118] 115 (08/02 0700) Resp:  [21-40] 35 (08/02 0700) BP: (81-140)/(62-105) 110/92 (08/02 0700) SpO2:  [91 %-100 %] 99 % (08/02 0700) Last BM Date: 02/20/21  Weight change: Filed Weights   02/21/21 0601 02/22/21 0600 02/23/21 0630  Weight: (!) 205.3 kg (!) 203.4 kg (!) 200.9 kg    Intake/Output:   Intake/Output Summary (Last 24 hours) at 02/24/2021 0743 Last data filed at 02/24/2021 0700 Gross per 24 hour  Intake 4184.64 ml  Output 7466 ml  Net -3281.36 ml      Physical Exam   CVP 17 General: NAD Neck: JVP 16 cm, no thyromegaly or thyroid nodule.  Lungs: Clear to auscultation bilaterally with normal respiratory effort. CV: Nondisplaced PMI.  Heart tachy, regular S1/S2, no S3/S4, no murmur.  No peripheral edema.  No carotid bruit.  Normal pedal pulses.  Abdomen: Soft, nontender, no hepatosplenomegaly, no distention.  Skin: Intact without lesions or rashes.  Neurologic: Alert and oriented x 3.  Psych: Normal affect. Extremities: No clubbing or cyanosis.  HEENT: Normal.    Telemetry   A flutter 110s (personally reviewed).   Labs    CBC Recent Labs    02/23/21 0419 02/23/21 1441 02/24/21 0535  WBC 9.1  --  14.9*  HGB 8.9* 11.2* 11.4*  HCT 29.3* 35.3* 36.6*  MCV 98.0  --  92.9  PLT 170  --  [id number]   Basic Metabolic  Panel Recent Labs    02/23/21 0540 02/23/21 1600 02/24/21 0535  NA 129* 127* 123*  K 4.9 4.7 4.7  CL 97* 92* 90*  CO2 20* 25 23  GLUCOSE 135* 210* 259*  BUN '16 16 17  '$ CREATININE 1.69* 1.82* 2.07*  CALCIUM 9.4 9.2 8.9  MG 2.5*  --  2.3  PHOS 2.9 3.0 2.7   Liver Function Tests Recent Labs    02/23/21 1600 02/24/21 0535  ALBUMIN 4.0 3.6    No results for input(s): LIPASE, AMYLASE in the last 72 hours. Cardiac Enzymes No results for input(s): CKTOTAL, CKMB, CKMBINDEX, TROPONINI in the last 72 hours.  BNP: BNP (last 3 results) Recent Labs    10/10/20 1336 11/07/20 0932 02/11/21 1500  BNP 126.9* 106.0* 194.3*    ProBNP (last 3 results) No results for input(s): PROBNP in the last 8760 hours.   D-Dimer No results for input(s): DDIMER in the last 72 hours.  Hemoglobin A1C No results for input(s): HGBA1C in the last 72 hours.  Fasting Lipid Panel No results for input(s): CHOL, HDL, LDLCALC, TRIG, CHOLHDL, LDLDIRECT in the last 72 hours. Thyroid Function Tests No results for input(s): TSH, T4TOTAL, T3FREE, THYROIDAB in the last 72 hours.  Invalid input(s): FREET3  Other results:   Imaging    DG CHEST PORT 1 VIEW  Result Date: 02/23/2021 CLINICAL DATA:  Shortness of breath, acute on chronic systolic CHF EXAM: PORTABLE CHEST 1 VIEW COMPARISON:  02/20/2021 FINDINGS: Right dialysis catheter remains in place, unchanged. Cardiomegaly with vascular congestion and bilateral perihilar opacities and interstitial prominence, increasing since prior study. No visible effusions. No acute bony abnormality. IMPRESSION: Worsening CHF. Electronically Signed   By: Rolm Baptise M.D.   On: 02/23/2021 08:40     Medications:     Scheduled Medications:  (feeding supplement) PROSource Plus  30 mL Oral TID BM   benzonatate  100 mg Oral BID   chlorhexidine  15 mL Mouth Rinse BID   Chlorhexidine Gluconate Cloth  6 each Topical Daily   mouth rinse  15 mL Mouth Rinse q12n4p    midodrine  5 mg Oral TID WC   multivitamin  1 tablet Oral QHS   pantoprazole  40 mg Oral Daily   senna-docusate  2 tablet Oral BID   sodium chloride flush  10-40 mL Intracatheter Q12H   sodium chloride flush  3 mL Intravenous Q12H    Infusions:   prismasol BGK 4/2.5 400 mL/hr at 02/24/21 0209    prismasol BGK 4/2.5 200 mL/hr at 02/23/21 0934   sodium chloride     sodium chloride Stopped (02/16/21 2155)   amiodarone 30 mg/hr (02/24/21 0600)   DOBUTamine 5 mcg/kg/min (02/24/21 0600)   ferric gluconate (FERRLECIT) IVPB     heparin 2,800 Units/hr (02/24/21 0700)   norepinephrine (LEVOPHED) Adult infusion 3 mcg/min (02/24/21 0600)   prismasol BGK 4/2.5 1,800 mL/hr at 02/24/21 0551    PRN Medications: sodium chloride, acetaminophen **OR** acetaminophen, alum & mag hydroxide-simeth, diphenhydrAMINE, guaiFENesin-dextromethorphan, heparin, heparin, magnesium hydroxide, menthol-cetylpyridinium, sodium chloride, sodium chloride flush, sodium chloride flush  Assessment/Plan   1. Acute on Chronic Biventricular Heart Failure:  Known cardiomyopathy since 2019, previously followed by [facility].  Echo 02/27/2020 with EF < 20%, moderate LV dilation, severely decreased RV function with severe RV dilation, severe biatrial enlargement, mild MR. No history of ETOH/drugs.  No FH of cardiomyopathy (mother with [place]).  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  He is too large for cardiac MRI. RHC/LHC on 03/04/20 with no CAD (nonischemic cardiomyopathy), elevated left and right heart filling pressures, and low CI at 2.1. 05/2020 Echo showed EF 20-25%. Evaluated by EP but not a candidate for ICD given BMI.  Echo this admission with EF < 20%, RV poorly visualized. He now has CHF exacerbation likely in setting of AFL with RVR.  He was hypotensive with poor UOP, concern for cardiogenic shock.  Dobutamine therefore started at 2.5 and increased to 5, NE added at 3 currently.   With progressive renal failure, CVVH started.  This morning, co-ox 62%. Weight coming down with CVP down to 17.  - Continue dobutamine 5 and NE 3.  Add midodrine 5 mg tid to help wean off pressors eventually.  - Continue CVVH for fluid offloading, aim for 150 cc/hr net today (BP stable).   - Fluid restrict <1,800 ml/ day - With AKI/hypotension, stopped Entresto, dapagliflozin, spironolactone, digoxin.  - Need attempt at DCCV to restore NSR, once volume better.  - With marked obesity, he is not a candidate for advanced therapies.  2. Aflutter w/ RVR: Noted to be in Afib prior admit, 3/22, in setting of CAP and a/c CHF but converted to NSR on amiodarone. Discharged home w/ eliquis. In  2:1 AFL this admit in setting of a/c CHF w/ marked volume overload, + recent gastroenteritis w/ diarrhea. Has OSA but not using CPAP. TSH WNL. Admits to intermittent compliance w/ Eliquis. Has missed several doses in the last month.  Now on amiodarone gtt 30 mg/hr with HR 110s. - Continue amiodarone at 30 mg/hr.  - Continue Eliquis 5 mg bid.  - Plan TEE/DCCV when more fluid off, aim for tomorrow morning. Discussed risks/benefits with patient and he agrees to procedure.   3. Acute Hypoxic Respiratory Failure: Suspect underlying OHS/OSA exacerbated by CHF this admission.  - Continue oxygen + CPAP.  4. Type 2 DM: Farxiga held with AKI.  5. OSA: Has not been on Bipap at home.  6. Super Morbid Obesity: Body mass index is 61.77 kg/m. 7. Tooth Abscess: Completed ceftriaxone. 8. UTI: Suspect by UA, sent urine culture.  ?Urinary obstruction related to this => no hydronephrosis on renal US.  - Ceftriaxone IV course completed.  9. Elevated Hs Trop: Suspect demand ischemia in setting of CHF + rapid AFL. Cath 8/21 showed no CAD.  10. AKI: Creatinine peaked to 3.32 though BUN not markedly high.  Concern for cardiorenal syndrome.  Renal US with no hydronephrosis. Remains on CVVHD, pulling 150 cc/hr.  Not as much UOP over the last  day. I fear that he will end up HD-dependent.  - Nephrology appreciated.  - UF 150 today as long as BP continues to tolerate.  11. Anemia: Hgb 11.4 today.  - Give IV Fe.  12. Cough: CXR with CHF.  - Improved.   CRITICAL CARE Performed by: [name]  Total critical care time: 35 minutes  Critical care time was exclusive of separately billable procedures and treating other patients.  Critical care was necessary to treat or prevent imminent or life-threatening deterioration.  Critical care was time spent personally by me on the following activities: development of treatment plan with patient and/or surrogate as well as nursing, discussions with consultants, evaluation of patient's response to treatment, examination of patient, obtaining history from patient or surrogate, ordering and performing treatments and interventions, ordering and review of laboratory studies, ordering and review of radiographic studies, pulse oximetry and re-evaluation of patient's condition.  Length of Stay: 52  [name], MD  02/24/2021, 7:43 AM  Advanced Heart Failure Team Pager [phone] (M-F; 7a - 5p)  Please contact [facility] for night-coverage after hours (5p -7a ) and weekends on [email or web address]

## 2021-02-24 NOTE — Progress Notes (Signed)
Nutrition Follow-up  DOCUMENTATION CODES:   Morbid obesity  INTERVENTION:   Continue Pro-Source supplements; encourage pt to take  Continue Renal MVI daily  If po intake does not improve, consider restarting Ensure Enlive supplements  NUTRITION DIAGNOSIS:   Increased nutrient needs related to acute illness as evidenced by estimated needs.  Continues  GOAL:   Patient will meet greater than or equal to 90% of their needs   MONITOR:   PO intake, Supplement acceptance, Weight trends, Labs  REASON FOR ASSESSMENT:   Rounds (CRRT)    ASSESSMENT:   35 yo male admitted with acute respiratory failure with pulmonary edema with acute decompensated heart failure, cardiogenic shock. Pt requiring CRRT for volume removal. PMH includes biventricular heart failure, morbid obesity, HTN, GERD, OSA, pre-DM  7/20 Admit 7/22 ECHO with LVEF <20% 7/24 CRRT initiated for volume removal  Today pt complaining of dyspnea at resent, in A/flutter Noted pt placed on BiPap, remains full code. Noted per MD notes, not likely to be able to cardiovert patient tomorrow without intubating patient and if intubated, likely hard to get pt off vent. Noted palliative care has now been consulted  Remains on CRRT, UF 150-200 ml/hr. Remains on dobutamine, levophed and amiodarone  Noted pt complaining of abdominal pain over night. MD evaluated pt at bedside with plans for KUB but pt declined. Lactate and CRP checked; low concern for bowel ischemia at this time per MD  Pt is endorsing decreased appetite, +nausea at times Pt has not been taking Pro-Source supplements per RN Recorded po intake 20-100% of meals; no intake recorded since 7/31 Noted Ensure Enlive had been discontinued several days ago as pt was eating very well, ordering extra food and drinking in excess.   Current wt 198.7 kg; admit weight 215.5 kg; net negative 26L per I/O flow sheet  Labs: sodium 123 (L) Meds: Rena-vite  Diet Order:   Diet  Order             Diet NPO time specified  Diet effective midnight           Diet NPO time specified  Diet effective midnight           Diet Heart Room service appropriate? Yes; Fluid consistency: Thin; Fluid restriction: 1800 mL Fluid  Diet effective now                   EDUCATION NEEDS:   Education needs have been addressed  Skin:  Skin Assessment: Reviewed RN Assessment  Last BM:  7/31  Height:   Ht Readings from Last 1 Encounters:  02/12/21 '5\' 11"'$  (1.803 m)    Weight:   Wt Readings from Last 1 Encounters:  02/24/21 (!) 198.7 kg     BMI:  Body mass index is 61.1 kg/m.  Estimated Nutritional Needs:   Kcal:  2300-3500 kcals  Protein:  150-170 g  Fluid:  </= 1.8 L fluid restriction per MD   Kerman Passey MS, RDN, LDN, CNSC Registered Dietitian III Clinical Nutrition RD Pager and On-Call Pager Number Located in [place]

## 2021-02-24 NOTE — Progress Notes (Signed)
HF TOC CM spoke to [place] for possible Trilogy/NIV for home. Will run pt's benefits for DME. [place], [place] ED TOC CM [phone]

## 2021-02-24 NOTE — Progress Notes (Signed)
[facility] Progress Note   35 y.o. year-old w/ hx of severe LV/ RV heart failure, super morbid obesity, HTN, OSA presented on 7/20 w/ SOB x 1 week, c/w CHF ->  IV lasix started and pt admitted. Atrial w/ RVR may have been cause of decomp CHF per cardiology team.  ECHO showed EF <20%. BP's dropped and pt moved to ICU for suspected cardiogenic shock. Started on IV dobutamine and norepi gtt's. CRRT started 7/24.  Assessment/ Plan:   AKI - creat 1.2 on admit, up to 3.3 in setting on cardiogenic shock, long-standing hx of severe bivent CHF, now w/ aflutter/ RVR.  Vol overload refractory to inotropes, pressors and IV lasix gtt ->  CRRT 7/24 ->    Seen on CRRT RIJ temp 400/200/1800 pre/post/qd Net UF 150-200 currently on Levophed and dobutamine Continues to be net negative daily  Filter clotting improved with heparin.  No significant change in urine output with IV diuretics.  Continue volume removal on CRRT  Vol overload -related to heart failure.  Volume removal as above. RV/ LV heart failure - per cardiology / CHF team -> waiting to optimize fluid status (markedly overloaded) prior to attempting DCCV.  Continue dobutamine and norepinephrine per primary team.  Holding home medications because of hypotension. UTI status post ceftriaxone Morbid obesity. DM -management per primary Hyponatremia: Associated with fluid overload. Na 123 today.  Continue CRRT as above. Consider increasing DFR Patient seen anemia: Hemoglobin 11.4 Iron deplete. Ordered IV iron x4 doses.  Subjective:   Continues on dobutamine and norepinephrine.  Tachycardia persistent.  CVP 17.  Nausea is persistent but no vomiting.  Echocardiogram with EF of less than 20%.  Net -3.4 L yesterday   Objective:   BP (!) 141/128   Pulse (!) 111   Temp 98.2 F (36.8 C) (Oral)   Resp (!) 37   Ht '5\' 11"'$  (1.803 m)   Wt (!) 198.7 kg   SpO2 96%   BMI 61.10 kg/m   Intake/Output Summary (Last 24 hours) at 02/24/2021  [id number] Last data filed at 02/24/2021 0900 Gross per 24 hour  Intake 4178.54 ml  Output 7382 ml  Net -3203.46 ml   Weight change:   Physical Exam: Gen lying in bed, morbidly obese, nasal cannula Chest bilateral chest rise, distant lung sounds, no rales/ wheezing RRR tachycardia, irregular rhythm Abd soft ntnd no mass or ascites +bs MS no joint effusions or deformity Ext 2+ bilateral lower extremity edema, edema pannus  Imaging: DG CHEST PORT 1 VIEW  Result Date: 02/23/2021 CLINICAL DATA:  Shortness of breath, acute on chronic systolic CHF EXAM: PORTABLE CHEST 1 VIEW COMPARISON:  02/20/2021 FINDINGS: Right dialysis catheter remains in place, unchanged. Cardiomegaly with vascular congestion and bilateral perihilar opacities and interstitial prominence, increasing since prior study. No visible effusions. No acute bony abnormality. IMPRESSION: Worsening CHF. Electronically Signed   By: Rolm Baptise M.D.   On: 02/23/2021 08:40    Labs: BMET Recent Labs  Lab 02/21/21 0522 02/21/21 1647 02/22/21 0617 02/22/21 1559 02/23/21 0540 02/23/21 1600 02/24/21 0535  NA 128* 129* 129* 128* 129* 127* 123*  K 4.4 3.9 4.6 4.4 4.9 4.7 4.7  CL 95* 95* 96* 95* 97* 92* 90*  CO2 '26 22 25 24 '$ 20* 25 23  GLUCOSE 246* 298* 114* 197* 135* 210* 259*  BUN '11 12 14 16 16 16 17  '$ CREATININE 1.30* 1.26* 1.39* 1.45* 1.69* 1.82* 2.07*  CALCIUM 9.0 8.8* 9.2 9.2 9.4 9.2 8.9  PHOS  2.7 2.7 3.0 2.5 2.9 3.0 2.7   CBC Recent Labs  Lab 02/22/21 0513 02/22/21 0619 02/23/21 0419 02/23/21 1441 02/24/21 0535  WBC 8.0 8.9 9.1  --  14.9*  HGB 9.8* 11.5* 8.9* 11.2* 11.4*  HCT 32.4* 36.5* 29.3* 35.3* 36.6*  MCV 96.4 93.4 98.0  --  92.9  PLT 210 208 170  --  245    Medications:     (feeding supplement) PROSource Plus  30 mL Oral TID BM   benzonatate  100 mg Oral BID   chlorhexidine  15 mL Mouth Rinse BID   Chlorhexidine Gluconate Cloth  6 each Topical Daily   mouth rinse  15 mL Mouth Rinse q12n4p   midodrine  5  mg Oral TID WC   multivitamin  1 tablet Oral QHS   pantoprazole  40 mg Oral Daily   senna-docusate  2 tablet Oral BID   sodium chloride flush  10-40 mL Intracatheter Q12H   sodium chloride flush  3 mL Intravenous Q12H      Reesa Chew  02/24/2021, 9:06 AM

## 2021-02-24 NOTE — Progress Notes (Signed)
Pt places self on/off cpap.  RT will continue to monitor

## 2021-02-25 ENCOUNTER — Other Ambulatory Visit (HOSPITAL_COMMUNITY): Payer: BC Managed Care – PPO

## 2021-02-25 ENCOUNTER — Other Ambulatory Visit: Payer: Self-pay

## 2021-02-25 ENCOUNTER — Encounter (HOSPITAL_COMMUNITY)
Admission: EM | Disposition: A | Discharge status: Home / Self Care | Payer: Self-pay | Source: Home / Self Care | Attending: Cardiology

## 2021-02-25 DIAGNOSIS — I4892 Unspecified atrial flutter: Secondary | ICD-10-CM | POA: Diagnosis not present

## 2021-02-25 DIAGNOSIS — I5023 Acute on chronic systolic (congestive) heart failure: Secondary | ICD-10-CM | POA: Diagnosis not present

## 2021-02-25 LAB — RENAL FUNCTION PANEL
Albumin: 3.7 g/dL (ref 3.5–5.0)
Albumin: 3.5 g/dL (ref 3.5–5.0)
Anion gap: 14 (ref 5–15)
Anion gap: 13 (ref 5–15)
BUN: 21 mg/dL — ABNORMAL HIGH (ref 6–20)
BUN: 20 mg/dL (ref 6–20)
CO2: 22 mmol/L (ref 22–32)
CO2: 20 mmol/L — ABNORMAL LOW (ref 22–32)
Calcium: 9.3 mg/dL (ref 8.9–10.3)
Calcium: 8.7 mg/dL — ABNORMAL LOW (ref 8.9–10.3)
Chloride: 90 mmol/L — ABNORMAL LOW (ref 98–111)
Chloride: 91 mmol/L — ABNORMAL LOW (ref 98–111)
Creatinine, Ser: 2.31 mg/dL — ABNORMAL HIGH (ref 0.61–1.24)
Creatinine, Ser: 2.4 mg/dL — ABNORMAL HIGH (ref 0.61–1.24)
GFR, Estimated: 37 mL/min — ABNORMAL LOW (ref >60)
GFR, Estimated: 35 mL/min — ABNORMAL LOW (ref >60)
Glucose, Bld: 152 mg/dL — ABNORMAL HIGH (ref 70–99)
Glucose, Bld: 149 mg/dL — ABNORMAL HIGH (ref 70–99)
Phosphorus: 2.8 mg/dL (ref 2.5–4.6)
Phosphorus: 2.9 mg/dL (ref 2.5–4.6)
Potassium: 4.8 mmol/L (ref 3.5–5.1)
Potassium: 4.9 mmol/L (ref 3.5–5.1)
Sodium: 126 mmol/L — ABNORMAL LOW (ref 135–145)
Sodium: 124 mmol/L — ABNORMAL LOW (ref 135–145)

## 2021-02-25 LAB — COOXEMETRY PANEL
Carboxyhemoglobin: 1.2 % (ref 0.5–1.5)
Methemoglobin: 1.1 % (ref 0.0–1.5)
O2 Saturation: 41.5 %
Total hemoglobin: 12.9 g/dL (ref 12.0–16.0)

## 2021-02-25 LAB — MAGNESIUM: Magnesium: 2.6 mg/dL — ABNORMAL HIGH (ref 1.7–2.4)

## 2021-02-25 LAB — CBC
HCT: 36.9 % — ABNORMAL LOW (ref 39.0–52.0)
Hemoglobin: 11.9 g/dL — ABNORMAL LOW (ref 13.0–17.0)
MCH: 29.7 pg (ref 26.0–34.0)
MCHC: 32.2 g/dL (ref 30.0–36.0)
MCV: 92 fL (ref 80.0–100.0)
Platelets: 233 10*3/uL (ref 150–400)
RBC: 4.01 MIL/uL — ABNORMAL LOW (ref 4.22–5.81)
RDW: 14.7 % (ref 11.5–15.5)
WBC: 16.2 10*3/uL — ABNORMAL HIGH (ref 4.0–10.5)
nRBC: 0 % (ref 0.0–0.2)

## 2021-02-25 LAB — HEPARIN LEVEL (UNFRACTIONATED): Heparin Unfractionated: 0.37 IU/mL (ref 0.30–0.70)

## 2021-02-25 LAB — GLUCOSE, CAPILLARY
Glucose-Capillary: 207 mg/dL — ABNORMAL HIGH (ref 70–99)
Glucose-Capillary: 214 mg/dL — ABNORMAL HIGH (ref 70–99)
Glucose-Capillary: 151 mg/dL — ABNORMAL HIGH (ref 70–99)

## 2021-02-25 LAB — APTT: aPTT: 67 seconds — ABNORMAL HIGH (ref 24–36)

## 2021-02-25 SURGERY — CANCELLED PROCEDURE

## 2021-02-25 MED ORDER — POLYETHYLENE GLYCOL 3350 17 G PO PACK
17.0000 g | PACK | Freq: Every day | ORAL | Status: DC
  Filled 2021-02-25 (×2): qty 1

## 2021-02-25 MED ORDER — NOREPINEPHRINE 16 MG/250ML-% IV SOLN: 6.0000 ug/min | INTRAVENOUS | Status: DC

## 2021-02-25 MED ORDER — MIDODRINE HCL 5 MG PO TABS
10.0000 mg | ORAL_TABLET | Freq: Three times a day (TID) | ORAL | Status: DC
  Administered 2021-02-25 (×2): 10 mg via ORAL
  Filled 2021-02-25 (×2): qty 2

## 2021-02-25 MED ORDER — INSULIN ASPART 100 UNIT/ML IJ SOLN
0.0000 [IU] | Freq: Every day | INTRAMUSCULAR | Status: DC
Start: 2021-02-25 — End: 2021-03-26
  Administered 2021-03-01 – 2021-03-17 (×3): 2 [IU] via SUBCUTANEOUS

## 2021-02-25 MED ORDER — INSULIN ASPART 100 UNIT/ML IJ SOLN
0.0000 [IU] | Freq: Three times a day (TID) | INTRAMUSCULAR | Status: DC
  Administered 2021-02-25 (×2): 3 [IU] via SUBCUTANEOUS
  Administered 2021-02-26: 2 [IU] via SUBCUTANEOUS
  Administered 2021-02-26: 1 [IU] via SUBCUTANEOUS
  Administered 2021-02-26: 2 [IU] via SUBCUTANEOUS
  Administered 2021-02-27: 9 [IU] via SUBCUTANEOUS
  Administered 2021-02-27 – 2021-02-28 (×3): 2 [IU] via SUBCUTANEOUS
  Administered 2021-02-28: 3 [IU] via SUBCUTANEOUS
  Administered 2021-03-01: 1 [IU] via SUBCUTANEOUS
  Administered 2021-03-01 – 2021-03-02 (×4): 2 [IU] via SUBCUTANEOUS
  Administered 2021-03-02: 1 [IU] via SUBCUTANEOUS
  Administered 2021-03-03 (×3): 2 [IU] via SUBCUTANEOUS
  Administered 2021-03-06 – 2021-03-16 (×13): 1 [IU] via SUBCUTANEOUS
  Administered 2021-03-16: 2 [IU] via SUBCUTANEOUS
  Administered 2021-03-16: 1 [IU] via SUBCUTANEOUS
  Administered 2021-03-17: 2 [IU] via SUBCUTANEOUS
  Administered 2021-03-18 – 2021-03-19 (×3): 1 [IU] via SUBCUTANEOUS
  Administered 2021-03-22 (×2): 2 [IU] via SUBCUTANEOUS
  Administered 2021-03-22 – 2021-03-25 (×5): 1 [IU] via SUBCUTANEOUS

## 2021-02-25 MED ORDER — NOREPINEPHRINE 4 MG/250ML-% IV SOLN: 6.0000 ug/min | INTRAVENOUS | Status: DC

## 2021-02-25 MED ORDER — SORBITOL 70 % SOLN
45.0000 mL | Freq: Once | Status: DC
  Filled 2021-02-25: qty 60

## 2021-02-25 MED ORDER — NOREPINEPHRINE 16 MG/250ML-% IV SOLN
6.0000 ug/min | INTRAVENOUS | Status: DC
  Administered 2021-02-25 – 2021-02-27 (×2): 6 ug/min via INTRAVENOUS
  Filled 2021-02-25: qty 250

## 2021-02-25 NOTE — Progress Notes (Signed)
Arrived to patient's room to place midline and to remove the PICC line. The patient gave verbal consent to have midline placed and gave verbal consent to have PICC line removed. The patient as well as staff preferred midline be on the right side once the PICC line was removed. [name] was able to accommodate current IV medications with IV access that was present. PICC line was removed. The patient then requested a cup of Ice water. The patient is on 1800 fluid restriction and had just completed a 480cc cup of grape juice. [name] educated patient on his fluid restrictions and asked if he could hold off on the Ice water due to closely reaching his fluid restriction limit. The patient became upset and stated no to having a midline placed and even verbalized signing himself out. [name] educated the patient as in why he needed the midline and the patient's severity of condition as to why it was not a good idea to try and leave AMA. The patient again refused and said he did not want to be poked again and did not care about needing the medicine. Education was given to the patient if he changed his mind then IV team would return to place the midline. I also verbalized to [name] that if the patient changed his mind, if he would re enter the midline consult and IV team would return.

## 2021-02-25 NOTE — Progress Notes (Signed)
RT note. RT attempt to place patient on cpap for the night. Patient not ready at this time. Told patient to let his RN know when he is ready for cpap and RT will place patient on cpap.

## 2021-02-25 NOTE — Progress Notes (Signed)
Called by nursing staff   Phillip Young wants to continue drinking fluids, refused midline catheter, and wants to leave AMA.   On arrival he is asking for ice and more water. Says he just wants more fluids even though he is on fluid restrictions. We discussed fluid restriction and he tells me he is tired and not sure he wants to continue doing this. I told him we have asked Palliative Care Team to consult for goals of care and that if he stops the pressors and CRRT he will likely not survive very long. We discussed transitioning to comfort care and giving medications shortness of breath if needed. He wants to think about this and talk to his Mom.   He is agreeable for midline catheter. Dr [name] aware.    Saivion Goettel NP-C  11:34 AM

## 2021-02-25 NOTE — Progress Notes (Signed)
[facility] Progress Note   35 y.o. year-old w/ hx of severe LV/ RV heart failure, super morbid obesity, HTN, OSA presented on 7/20 w/ SOB x 1 week, c/w CHF ->  IV lasix started and pt admitted. Atrial w/ RVR may have been cause of decomp CHF per cardiology team.  ECHO showed EF <20%. BP's dropped and pt moved to ICU for suspected cardiogenic shock. Started on IV dobutamine and norepi gtt's. CRRT started 7/24.  Assessment/ Plan:   AKI - creat 1.2 on admit, up to 3.3 in setting on cardiogenic shock, long-standing hx of severe bivent CHF, now w/ aflutter/ RVR.  Vol overload refractory to inotropes, pressors and IV lasix gtt ->  CRRT 7/24 ->    Seen on CRRT RIJ temp 400/200/3000 pre/post/qd Net UF 150-200 currently on Levophed and dobutamine Continues to be net negative daily  Filter clotting improved with heparin.  CRRT DFR was adjusted for hyponatremia as below. Continue with aggressive volume removal.   Vol overload -related to heart failure.  Volume removal as above. RV/ LV heart failure - per cardiology / CHF team -> waiting to optimize fluid status (markedly overloaded) prior to attempting DCCV.  Continue dobutamine and norepinephrine per primary team.  Holding home medications because of hypotension. UTI status post ceftriaxone Morbid obesity. DM -management per primary Hyponatremia: Associated with fluid overload worsened yesterday. Odd that this is occurring on CRRT. Did improve today after increasing his DFR to appropriate level for his weight. CRRT as above. Limit his free water as able Patient seen anemia: Hemoglobin improved ~11. Iron deplete. Ordered IV iron x4 doses.  Subjective:   Patient had worsening clinical status yesterday with very rapid atrial flutter and increased respiratory rate requiring more amiodarone.  There was concerned about ability to protect his airway but he did not get put on the ventilator.  Dobutamine was increased to 7.5 MCG.  Palliative  care was consulted.  Patient has been drinking a fair amount of fluids and it is a struggle to get him to drink less.   Objective:   BP (!) 79/53   Pulse (!) 108   Temp 97.8 F (36.6 C) (Oral)   Resp (!) 35   Ht '5\' 11"'$  (1.803 m)   Wt (!) 197.1 kg   SpO2 100%   BMI 60.60 kg/m   Intake/Output Summary (Last 24 hours) at 02/25/2021 [id number] Last data filed at 02/25/2021 0915 Gross per 24 hour  Intake 4793.05 ml  Output 8010 ml  Net -3216.95 ml   Weight change:   Physical Exam: Gen lying in bed, morbidly obese, BiPAP Chest bilateral chest rise, distant lung sounds, no rales/ wheezing RRR tachycardia, irregular rhythm Abd soft ntnd no mass or ascites +bs MS no joint effusions or deformity Ext 2+ bilateral lower extremity edema, edema pannus  Imaging: No results found.  Labs: BMET Recent Labs  Lab 02/22/21 0617 02/22/21 1559 02/23/21 0540 02/23/21 1600 02/24/21 0535 02/24/21 1600 02/25/21 0419  NA 129* 128* 129* 127* 123* 120* 126*  K 4.6 4.4 4.9 4.7 4.7 4.3 4.8  CL 96* 95* 97* 92* 90* 87* 90*  CO2 25 24 20* 25 23 20* 22  GLUCOSE 114* 197* 135* 210* 259* 381* 152*  BUN '14 16 16 16 17 19 '$ 21*  CREATININE 1.39* 1.45* 1.69* 1.82* 2.07* 2.31* 2.31*  CALCIUM 9.2 9.2 9.4 9.2 8.9 8.7* 9.3  PHOS 3.0 2.5 2.9 3.0 2.7 3.0 2.8   CBC Recent Labs  Lab 02/22/21 [phone]  02/23/21 0419 02/23/21 1441 02/24/21 0535 02/25/21 0419  WBC 8.9 9.1  --  14.9* 16.2*  HGB 11.5* 8.9* 11.2* 11.4* 11.9*  HCT 36.5* 29.3* 35.3* 36.6* 36.9*  MCV 93.4 98.0  --  92.9 92.0  PLT 208 170  --  245 233    Medications:     (feeding supplement) PROSource Plus  30 mL Oral TID BM   benzonatate  100 mg Oral BID   chlorhexidine  15 mL Mouth Rinse BID   Chlorhexidine Gluconate Cloth  6 each Topical Daily   insulin aspart  0-5 Units Subcutaneous QHS   insulin aspart  0-9 Units Subcutaneous TID WC   mouth rinse  15 mL Mouth Rinse q12n4p   midodrine  10 mg Oral TID WC   multivitamin  1 tablet Oral QHS    pantoprazole  40 mg Oral Daily   polyethylene glycol  17 g Oral Daily   senna-docusate  2 tablet Oral BID   sodium chloride flush  10-40 mL Intracatheter Q12H   sodium chloride flush  3 mL Intravenous Q12H      Shaune Pollack Auther Lyerly  02/25/2021, 9:22 AM

## 2021-02-25 NOTE — Progress Notes (Signed)
PT Cancellation Note  Patient Details Name: Phillip Young MRN: [id number] DOB: 1986/01/25   Cancelled Treatment:    Reason Eval/Treat Not Completed: Patient declined, no reason specified.  Pt refuses therapy while on CRRT.   02/25/2021  Ginger Carne., PT Acute Rehabilitation Services [phone]  (pager) [phone]  (office)   Tessie Fass Adda Stokes 02/25/2021, 4:09 PM

## 2021-02-25 NOTE — Progress Notes (Addendum)
Patient ID: Phillip Young, male   DOB: Sep 15, 1985, 34 y.o.   MRN: [id number]     Advanced Heart Failure Rounding Note  PCP-Cardiologist: None   Subjective:   8/2 A flutter rate 200s given amio bolus and amio. DBA increased to 7.5.   On DBA 7.5 + NE 3. Co-ox 41%  WBC trending up.   Remains on CRRT   Drinking fluids all night. Feels better today. Says he does not want to stop drinking   Echo: EF < 20%, severe LV dilation, RV not well visualized.   Objective:   Weight Range: (!) 197.1 kg Body mass index is 60.6 kg/m.   Vital Signs:   Temp:  [97.3 F (36.3 C)-97.8 F (36.6 C)] 97.8 F (36.6 C) (08/03 0410) Pulse Rate:  [106-114] 108 (08/03 0600) Resp:  [21-40] 28 (08/03 0600) BP: (71-150)/(55-128) 107/65 (08/03 0600) SpO2:  [94 %-100 %] 96 % (08/03 0600) Weight:  [197.1 kg-198.7 kg] 197.1 kg (08/03 0600) Last BM Date: 02/22/21  Weight change: Filed Weights   02/23/21 0630 02/24/21 0800 02/25/21 0600  Weight: (!) 200.9 kg (!) 198.7 kg (!) 197.1 kg    Intake/Output:   Intake/Output Summary (Last 24 hours) at 02/25/2021 0732 Last data filed at 02/25/2021 0714 Gross per 24 hour  Intake 4820.82 ml  Output 7721 ml  Net -2900.18 ml      Physical Exam  CVP 17 General:  Sitting on the side of the bed. No resp difficulty HEENT: normal Neck: supple. JVP difficult to assess.  Carotids 2+ bilat; no bruits. No lymphadenopathy or thryomegaly appreciated. Cor: PMI nondisplaced. Tachy Regular rate & rhythm. No rubs, gallops or murmurs. Lungs: clear Abdomen: soft, nontender, nondistended. No hepatosplenomegaly. No bruits or masses. Good bowel sounds. Extremities: no cyanosis, clubbing, rash, edema Neuro: alert & orientedx3, cranial nerves grossly intact. moves all 4 extremities w/o difficulty. Affect pleasant   Telemetry   A flutter 110s (personally reviewed).   Labs    CBC Recent Labs    02/24/21 0535 02/25/21 0419  WBC 14.9* 16.2*  HGB 11.4* 11.9*  HCT 36.6* 36.9*   MCV 92.9 92.0  PLT 245 0000000   Basic Metabolic Panel Recent Labs    02/24/21 0535 02/24/21 1600 02/25/21 0419  NA 123* 120* 126*  K 4.7 4.3 4.8  CL 90* 87* 90*  CO2 23 20* 22  GLUCOSE 259* 381* 152*  BUN 17 19 21*  CREATININE 2.07* 2.31* 2.31*  CALCIUM 8.9 8.7* 9.3  MG 2.3  --  2.6*  PHOS 2.7 3.0 2.8   Liver Function Tests Recent Labs    02/24/21 1600 02/25/21 0419  ALBUMIN 3.4* 3.7    No results for input(s): LIPASE, AMYLASE in the last 72 hours. Cardiac Enzymes No results for input(s): CKTOTAL, CKMB, CKMBINDEX, TROPONINI in the last 72 hours.  BNP: BNP (last 3 results) Recent Labs    10/10/20 1336 11/07/20 0932 02/11/21 1500  BNP 126.9* 106.0* 194.3*    ProBNP (last 3 results) No results for input(s): PROBNP in the last 8760 hours.   D-Dimer No results for input(s): DDIMER in the last 72 hours.  Hemoglobin A1C No results for input(s): HGBA1C in the last 72 hours.  Fasting Lipid Panel No results for input(s): CHOL, HDL, LDLCALC, TRIG, CHOLHDL, LDLDIRECT in the last 72 hours. Thyroid Function Tests No results for input(s): TSH, T4TOTAL, T3FREE, THYROIDAB in the last 72 hours.  Invalid input(s): FREET3   Other results:   Imaging    No  results found.   Medications:     Scheduled Medications:  (feeding supplement) PROSource Plus  30 mL Oral TID BM   benzonatate  100 mg Oral BID   chlorhexidine  15 mL Mouth Rinse BID   Chlorhexidine Gluconate Cloth  6 each Topical Daily   mouth rinse  15 mL Mouth Rinse q12n4p   midodrine  5 mg Oral TID WC   multivitamin  1 tablet Oral QHS   pantoprazole  40 mg Oral Daily   senna-docusate  2 tablet Oral BID   sodium chloride flush  10-40 mL Intracatheter Q12H   sodium chloride flush  3 mL Intravenous Q12H    Infusions:   prismasol BGK 4/2.5 400 mL/hr at 02/25/21 0514    prismasol BGK 4/2.5 200 mL/hr at 02/24/21 1144   sodium chloride     amiodarone 30 mg/hr (02/25/21 0700)   DOBUTamine 7.5  mcg/kg/min (02/25/21 0700)   ferric gluconate (FERRLECIT) IVPB Stopped (02/24/21 1208)   heparin 2,900 Units/hr (02/25/21 0700)   norepinephrine (LEVOPHED) Adult infusion 3 mcg/min (02/25/21 0700)   prismasol BGK 4/2.5 3,000 mL/hr at 02/25/21 0620    PRN Medications: sodium chloride, acetaminophen **OR** acetaminophen, alum & mag hydroxide-simeth, diphenhydrAMINE, guaiFENesin-dextromethorphan, heparin, heparin, magnesium hydroxide, menthol-cetylpyridinium, ondansetron (ZOFRAN) IV, sodium chloride, sodium chloride flush, sodium chloride flush  Assessment/Plan   1. Acute on Chronic Biventricular Heart Failure:  Known cardiomyopathy since 2019, previously followed by [facility].  Echo 02/27/2020 with EF < 20%, moderate LV dilation, severely decreased RV function with severe RV dilation, severe biatrial enlargement, mild MR. No history of ETOH/drugs.  No FH of cardiomyopathy (mother with Lamar).  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  He is too large for cardiac MRI. RHC/LHC on 03/04/20 with no CAD (nonischemic cardiomyopathy), elevated left and right heart filling pressures, and low CI at 2.1. 05/2020 Echo showed EF 20-25%. Evaluated by EP but not a candidate for ICD given BMI.  Echo this admission with EF < 20%, RV poorly visualized. He now has CHF exacerbation likely in setting of AFL with RVR.  He was hypotensive with poor UOP, concern for cardiogenic shock.  Dobutamine therefore started at 2.5 and increased to 5, NE added at 3 currently.  With progressive renal failure, CVVH started.  This morning, co-ox 41.5. Weight coming down with CVP down to 17.  - Continue dobutamine 7.5 and increase NE 6 mcg. Increase midodrine to 10 mg tid. -Continue CVVH for fluid offloading, aim for 150 cc/hr net today (BP stable).   - Continues to drink lots of fluids. Discussed limiting fluid.  - With AKI/hypotension, stopped Entresto, dapagliflozin, spironolactone, digoxin.   - Need attempt at DCCV to restore NSR, once volume better.  - With marked obesity, he is not a candidate for advanced therapies.  2. Aflutter w/ RVR: Noted to be in Afib prior admit, 3/22, in setting of CAP and a/c CHF but converted to NSR on amiodarone. Discharged home w/ eliquis. In 2:1 AFL this admit in setting of a/c CHF w/ marked volume overload, + recent gastroenteritis w/ diarrhea. Has OSA but not using CPAP. TSH WNL. Admits to intermittent compliance w/ Eliquis. Has missed several doses in the last month.  -Yesterday rate in the 200s so given IV bolus and continued on amio drip.  - Increase amio drip 60 mg per hour. -  - Continue Eliquis 5 mg bid.  - Plan TEE/DCCV when more fluid off. Discussed risks/benefits with patient and he agrees  to procedure.  Not sure he can get this because he wont stop drinking fluids.  3. Acute Hypoxic Respiratory Failure: Suspect underlying OHS/OSA exacerbated by CHF this admission.  - Continue oxygen + CPAP.  4. Type 2 DM: Farxiga held with AKI.  5. OSA: Has not been on Bipap at home.  6. Super Morbid Obesity: Body mass index is 60.6 kg/m. 7. Tooth Abscess: Completed ceftriaxone. 8. UTI: Suspect by UA, sent urine culture.  ?Urinary obstruction related to this => no hydronephrosis on renal US.  - Ceftriaxone IV course completed.  9. Elevated Hs Trop: Suspect demand ischemia in setting of CHF + rapid AFL. Cath 8/21 showed no CAD.  10. AKI: Creatinine peaked to 3.32 though BUN not markedly high.  Concern for cardiorenal syndrome.  Renal US with no hydronephrosis. Remains on CVVHD, pulling 150 cc/hr.  Not as much UOP over the last day. Concerned he will end up HD-dependent.  - Nephrology appreciated.  - UF 150 today as long as BP continues to tolerate.  11. Anemia: Hgb 11.9 today.  - Received IV Fe.  12. Cough: CXR with CHF.  - Improved.  13. Hyponatremia.  14. ID: WBC trending up Check blood cultures x2.   Length of Stay: [place], NP  02/25/2021,  7:32 AM  Advanced Heart Failure Team Pager [phone] (M-F; 7a - 5p)  Please contact [facility] for night-coverage after hours (5p -7a ) and weekends on [email or web address]   Patient seen with NP, agree with the above note.   Drinking a lot of fluid overnight, still able to get 4 lbs off via CVVH.  CVP 17.  UOP less. Co-ox 41% this morning on dobutamine 7.5 + NE 3.  Remains in atrial flutter 110.   WBCs rising 16.2.   General: NAD, on CPAP.  Neck: JVP 16, no thyromegaly or thyroid nodule.  Lungs: Clear to auscultation bilaterally with normal respiratory effort. CV: Nondisplaced PMI.  Heart tachy, regular S1/S2, no S3/S4, no murmur.  Trace ankle edema.  Abdomen: Soft, nontender, no hepatosplenomegaly, no distention.  Skin: Intact without lesions or rashes.  Neurologic: Alert and oriented x 3.  Psych: Normal affect. Extremities: No clubbing or cyanosis.  HEENT: Normal.   Continue CVVH net UF 150 cc/hr today.  Think we are getting close.  Increase midodrine to 10 mg tid, continue dobutamine, and increase NE to 6 with low co-ox.   Still think TEE-DCCV is going to be high risk, still very hard to lie flat without CPAP.  Continue CVVH today, reassess tomorrow.  Will discuss with Dr. Valeta Harms, may try just DCCV without TEE, understanding risk is higher for CVA but TEE may be too dangerous for him in terms of aspiration risk and intubation (would be very hard to get him extubated). Keep NPO after midnight.   Have discussed code status with him still wants to be full code but willing to talk with palliative care.  I fear that if we intubate him we will not be able to get him extubated.   CRITICAL CARE Performed by: [name]  Total critical care time: 35 minutes  Critical care time was exclusive of separately billable procedures and treating other patients.  Critical care was necessary to treat or prevent imminent or life-threatening deterioration.  Critical care was time spent personally by  me on the following activities: development of treatment plan with patient and/or surrogate as well as nursing, discussions with consultants, evaluation of patient's response to treatment, examination of  patient, obtaining history from patient or surrogate, ordering and performing treatments and interventions, ordering and review of laboratory studies, ordering and review of radiographic studies, pulse oximetry and re-evaluation of patient's condition.  [name] 02/25/2021 8:14 AM

## 2021-02-25 NOTE — Plan of Care (Signed)
  Problem: Fluid Volume: Goal: Fluid volume balance will be maintained or improved Outcome: Not Progressing   Problem: Nutritional: Goal: Ability to make appropriate dietary choices will improve Outcome: Progressing   Problem: Respiratory: Goal: Respiratory symptoms related to disease process will be avoided Outcome: Progressing

## 2021-02-25 NOTE — Progress Notes (Signed)
ANTICOAGULATION CONSULT NOTE- Follow-Up Note  Pharmacy Consult for Heparin  Indication: atrial fibrillation  Patient Measurements: Height: '5\' 11"'$  (180.3 cm) Weight: (!) 197.1 kg (434 lb 8.4 oz) IBW/kg (Calculated) : 75.3 HEPARIN DW (KG): 130.5   Vital Signs: Temp: 97.8 F (36.6 C) (08/03 0410) Temp Source: Oral (08/03 0410) BP: 107/65 (08/03 0600) Pulse Rate: 108 (08/03 0600)  Labs: Recent Labs    02/23/21 0419 02/23/21 0540 02/23/21 1441 02/23/21 1600 02/23/21 2119 02/24/21 0535 02/24/21 0700 02/24/21 1052 02/24/21 1600 02/25/21 0419  HGB 8.9*  --  11.2*  --   --  11.4*  --   --   --  11.9*  HCT 29.3*  --  35.3*  --   --  36.6*  --   --   --  36.9*  PLT 170  --   --   --   --  245  --   --   --  233  APTT 58*  --  69*  --  65*  --  63*  --   --  67*  LABPROT  --   --   --   --   --   --   --  17.3*  --   --   INR  --   --   --   --   --   --   --  1.4*  --   --   HEPARINUNFRC 0.38  --  0.42  --   --   --   --   --   --  0.37  CREATININE  --    < >  --    < >  --  2.07*  --   --  2.31* 2.31*   < > = values in this interval not displayed.     Estimated Creatinine Clearance: 78.3 mL/min (A) (by C-G formula based on SCr of 2.31 mg/dL (H)).    Assessment 35 yo male on apixaban for afib. He has acute HF and on CRRT and plans to start heparin. Last dose of apixaban given 7/29 ~ 10pm.   aPTT is therapeutic at 67 seconds, heparin level 0.37 close to correlating - will begin heparin level monitoring tomorrow. CBC stable, no bleeding noted.  Goal of Therapy:  Heparin level 0.3-0.7 units/ml Monitor platelets by anticoagulation protocol: Yes   Plan:  -Continue heparin 2900 units/h -Daily aPTT and heparin level   Arrie Senate, PharmD, BCPS, [facility] Clinical Pharmacist [phone] Please check AMION for all [facility] Pharmacy numbers 02/25/2021

## 2021-02-26 DIAGNOSIS — Z7189 Other specified counseling: Secondary | ICD-10-CM

## 2021-02-26 DIAGNOSIS — Z515 Encounter for palliative care: Secondary | ICD-10-CM

## 2021-02-26 DIAGNOSIS — I5023 Acute on chronic systolic (congestive) heart failure: Secondary | ICD-10-CM | POA: Diagnosis not present

## 2021-02-26 DIAGNOSIS — I4892 Unspecified atrial flutter: Secondary | ICD-10-CM | POA: Diagnosis not present

## 2021-02-26 DIAGNOSIS — R0602 Shortness of breath: Secondary | ICD-10-CM

## 2021-02-26 DIAGNOSIS — K5901 Slow transit constipation: Secondary | ICD-10-CM

## 2021-02-26 LAB — RENAL FUNCTION PANEL
Albumin: 3.5 g/dL (ref 3.5–5.0)
Albumin: 3.4 g/dL — ABNORMAL LOW (ref 3.5–5.0)
Anion gap: 15 (ref 5–15)
Anion gap: 12 (ref 5–15)
BUN: 17 mg/dL (ref 6–20)
BUN: 15 mg/dL (ref 6–20)
CO2: 20 mmol/L — ABNORMAL LOW (ref 22–32)
CO2: 24 mmol/L (ref 22–32)
Calcium: 9.4 mg/dL (ref 8.9–10.3)
Calcium: 8.7 mg/dL — ABNORMAL LOW (ref 8.9–10.3)
Chloride: 89 mmol/L — ABNORMAL LOW (ref 98–111)
Chloride: 92 mmol/L — ABNORMAL LOW (ref 98–111)
Creatinine, Ser: 2.51 mg/dL — ABNORMAL HIGH (ref 0.61–1.24)
Creatinine, Ser: 2.32 mg/dL — ABNORMAL HIGH (ref 0.61–1.24)
GFR, Estimated: 33 mL/min — ABNORMAL LOW (ref >60)
GFR, Estimated: 37 mL/min — ABNORMAL LOW (ref >60)
Glucose, Bld: 153 mg/dL — ABNORMAL HIGH (ref 70–99)
Glucose, Bld: 190 mg/dL — ABNORMAL HIGH (ref 70–99)
Phosphorus: 3 mg/dL (ref 2.5–4.6)
Phosphorus: 2.1 mg/dL — ABNORMAL LOW (ref 2.5–4.6)
Potassium: 5.1 mmol/L (ref 3.5–5.1)
Potassium: 4.7 mmol/L (ref 3.5–5.1)
Sodium: 124 mmol/L — ABNORMAL LOW (ref 135–145)
Sodium: 128 mmol/L — ABNORMAL LOW (ref 135–145)

## 2021-02-26 LAB — GLUCOSE, CAPILLARY
Glucose-Capillary: 128 mg/dL — ABNORMAL HIGH (ref 70–99)
Glucose-Capillary: 154 mg/dL — ABNORMAL HIGH (ref 70–99)
Glucose-Capillary: 166 mg/dL — ABNORMAL HIGH (ref 70–99)
Glucose-Capillary: 186 mg/dL — ABNORMAL HIGH (ref 70–99)

## 2021-02-26 LAB — CBC
HCT: 35.8 % — ABNORMAL LOW (ref 39.0–52.0)
Hemoglobin: 11.8 g/dL — ABNORMAL LOW (ref 13.0–17.0)
MCH: 29.8 pg (ref 26.0–34.0)
MCHC: 33 g/dL (ref 30.0–36.0)
MCV: 90.4 fL (ref 80.0–100.0)
Platelets: 255 10*3/uL (ref 150–400)
RBC: 3.96 MIL/uL — ABNORMAL LOW (ref 4.22–5.81)
RDW: 14.8 % (ref 11.5–15.5)
WBC: 16.3 10*3/uL — ABNORMAL HIGH (ref 4.0–10.5)
nRBC: 0 % (ref 0.0–0.2)

## 2021-02-26 LAB — COOXEMETRY PANEL
Carboxyhemoglobin: 1 % (ref 0.5–1.5)
Carboxyhemoglobin: 1.1 % (ref 0.5–1.5)
Methemoglobin: 0.7 % (ref 0.0–1.5)
Methemoglobin: 0.7 % (ref 0.0–1.5)
O2 Saturation: 44.1 %
O2 Saturation: 45.5 %
Total hemoglobin: 12.4 g/dL (ref 12.0–16.0)
Total hemoglobin: 12 g/dL (ref 12.0–16.0)

## 2021-02-26 LAB — APTT: aPTT: 84 seconds — ABNORMAL HIGH (ref 24–36)

## 2021-02-26 LAB — MAGNESIUM: Magnesium: 2.7 mg/dL — ABNORMAL HIGH (ref 1.7–2.4)

## 2021-02-26 LAB — HEPARIN LEVEL (UNFRACTIONATED): Heparin Unfractionated: 0.43 IU/mL (ref 0.30–0.70)

## 2021-02-26 MED ORDER — GLYCERIN (LAXATIVE) 2 G RE SUPP
1.0000 | Freq: Once | RECTAL | Status: DC
  Filled 2021-02-26: qty 1

## 2021-02-26 MED ORDER — OXYCODONE HCL 5 MG/5ML PO SOLN
2.5000 mg | ORAL | Status: DC | PRN
  Administered 2021-02-26 – 2021-03-03 (×18): 2.5 mg via ORAL
  Filled 2021-02-26 (×19): qty 5

## 2021-02-26 MED ORDER — SODIUM PHOSPHATES 45 MMOLE/15ML IV SOLN
30.0000 mmol | Freq: Once | INTRAVENOUS | Status: AC
  Administered 2021-02-26: 30 mmol via INTRAVENOUS
  Filled 2021-02-26: qty 10

## 2021-02-26 MED ORDER — POLYETHYLENE GLYCOL 3350 17 G PO PACK
17.0000 g | PACK | Freq: Two times a day (BID) | ORAL | Status: DC
  Administered 2021-02-26 (×2): 17 g via ORAL
  Filled 2021-02-26: qty 1

## 2021-02-26 MED ORDER — GLYCERIN (LAXATIVE) 1 G RE SUPP
2.0000 | Freq: Once | RECTAL | Status: DC
  Filled 2021-02-26 (×2): qty 2

## 2021-02-26 MED ORDER — MIDODRINE HCL 5 MG PO TABS
15.0000 mg | ORAL_TABLET | Freq: Three times a day (TID) | ORAL | Status: DC
  Administered 2021-02-26 – 2021-03-01 (×10): 15 mg via ORAL
  Filled 2021-02-26 (×10): qty 3

## 2021-02-26 MED ORDER — SORBITOL 70 % SOLN
30.0000 mL | Freq: Once | Status: AC
  Administered 2021-02-26: 30 mL via ORAL
  Filled 2021-02-26: qty 30

## 2021-02-26 NOTE — Consult Note (Addendum)
Consultation Note Date: 02/26/2021   Patient Name: Phillip Young  DOB: 10-Jan-1986  MRN: [id number]  Age / Sex: 35 y.o., male  PCP: [name], MD Referring Physician: Larey Dresser, MD  Reason for Consultation: Establishing goals of care  HPI/Patient Profile: 35 y.o. male  with past medical history of diabetes, OSA, CHF, morbid obesity admitted on 02/11/2021 with shortness of breath and palpitations. Workup reveals CHF exacerbation and acute renal failure in the setting of A-fib with RVR. ECHO showed EF <20%. He has been hypotensive - is on dobutamine and norepinephrine. Currently on CRRT. Scheduled for DCCV tomorrow without TE or intubation due to his high risk to not be able to extubate. He was refusing fluid restrictions and midline (but currently has accepted)- and made comment to cardiology NP that he was "tired and not sure he wanted to continue doing this". Palliative medicine consulted for goals of care.   Clinical Assessment and Goals of Care: I have reviewed medical records including EPIC notes, labs and imaging, received report from RN, assessed the patient and then met at the bedside along with Will to discuss diagnosis prognosis, GOC, EOL wishes, disposition and options.  I introduced Palliative Medicine as specialized medical care for people living with serious illness. It focuses on providing relief from the symptoms and stress of a serious illness. The goal is to improve quality of life for both the patient and the family.  We discussed a brief life review of the patient- he is originally from [place], [place]- moved to [place] to be with his family. Lives with his Uncle. 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   We discussed his chronic CHF, as well as his acute state and his goals of care.  His current goals of care are to continue full scope treatment.  If he needs  permanent dialysis he would be willing to do that.  He hopes to be able to return to his job driving a bus.  We discussed the what if's those goals cannot be attained and what his idea of quality of life is.  He was able to define quality of life and being alive as being able to interact with people.  If he was unable to be awake and to interact then he would no longer wish to continue aggressive measures to prolong his life.  His suffering was also discussed-he has had an extended hospitalization this has affected his mood greatly.  He feels that the hospital staff cares greatly for him and he understands now-after discussion with multiple providers- that he must also participate in his care.  He appears to be more motivated.    He shares that he would like to lose weight.  He has stopped eating solid food and is only ordering fruit and drinking ensures.  He is also choosing fruit juices as his fluid intake.  We discussed the necessity to receive nutrition from all different foods, "eating the rainbow".  He was surprised to hear about the sugar  content of the orange juice that he is drinking.  He is bothered by the fluid restriction-he feels the juice gives him energy and brightens his mind.  Attempted to discuss alternatives that he can use to the juice- ie hard candy- he was not interested in this.   He is currently constipated and this is causing him suffering-he feels bloated.  He is passing gas, no nausea or vomiting or abdominal pain.  He is short of breath at rest-this increases with any type of activity.  Nursing has offered to walk down the hall to get him out of the room-however he declines stating he is too short of breath.  This also affects his sleep.  CODE STATUS and advanced directives were discussed.  He was educated that in his current state of health CPR would be unlikely to return him to a state where he would be interactive with people which is his stated goal.  We also discussed the  fact that if he was intubated he would likely not be able to come off of the life support.  I also educated him regarding advanced directives and the importance of advance care planning and advanced directives within the context of his own goals of care and discussing this with his family.  He was unable to make decisions for himself his mother would be his decision maker.   Nursing reported he had been telling his family he was going to Hospice. He tells me that he was saying this to "prank" his family. He acknowledges that there can be truth in his joking. We discussed suffering and how much suffering he is willing to endure. For now he does not wish to go to Hospice. Will continue to explore these feelings with him.   Questions and concerns were addressed.  Hard Choices booklet left for review. The family was encouraged to call with questions or concerns.    Primary Decision Maker PATIENT    SUMMARY OF RECOMMENDATIONS-  GOC/Advanced care planning- full scope, full code- Copy of Advanced Directives given- he will consider completing wishes to discuss with his mother- DNR was recommended due to CPR and intubation will not be effective in restoring him to his previous state of functioning if he experiences cardiac or respiratory arrest due to his chronic illnesses and comorbidities. Will followup with him tomorrow.  -Heart failure- continue current plan and scope of care- patient is wrestling with the fact that his illness is chronic and his own role in his healthcare- noted plan for DCCV tomorrow- will continue to follow his trajectory- and discuss GOC as needed -Shortness of breath- will add low dose liquid oxycodone 2.62m po q2hr prn in efforts to increase his mobility and decrease his suffering- PDMP was reviewed and patient was screened for substance use history which is negative Constipation- LBM is documented as 7/31- need to be aggressive with interventions- he is currently on senna-S 2po bid  since 8/1, sorbitol this morning with no results. Will add miralax 17gm po bid. Patient does not want suppository. Diabetes- last A1C was 6.5- he is on FIranoutpatient but he has poor understanding that he even has DM and how his diet relates to this. Dietician consulted for education- noted that food insecurity is listed as one of his social determinants of health risk factors- he may benefit from social work consult to help address this as well. He also needs a primary care provider to help him manage this       Code  Status/Advance Care Planning: Full code  Psycho-social/Spiritual:  Desire for further Chaplaincy support:no Additional Recommendations: Referral to Community Resources   Prognosis:   Unable to determine  Discharge Planning: To Be Determined  Primary Diagnoses: Present on Admission:  GERD without esophagitis  Essential hypertension  OSA (obstructive sleep apnea)  Prediabetes  Atrial flutter with rapid ventricular response ([name])  Acute on chronic systolic (congestive) heart failure ([name])  Elevated troponin   I have reviewed the medical record, interviewed the patient and family, and examined the patient. The following aspects are pertinent.  Past Medical History:  Diagnosis Date   Biventricular congestive heart failure ([place])    Last Echo 11/2019 at [facility] reveals EF 20%   Class 3 severe obesity due to excess calories with serious comorbidity and body mass index (BMI) of 50.0 to 59.9 in adult [facility]) 02/26/2020   Essential hypertension 02/26/2020   GERD without esophagitis 02/26/2020   Hidradenitis suppurativa 02/26/2020   OSA (obstructive sleep apnea) 02/26/2020   Prediabetes 02/26/2020   Social History   Socioeconomic History   Marital status: Unknown    Spouse name: Not on file   Number of children: Not on file   Years of education: Not on file   Highest education level: Not on file  Occupational History   Not on file  Tobacco Use   Smoking status: Former     Packs/day: 1.00    Types: Cigarettes    Quit date: 2019    Years since quitting: 3.5   Smokeless tobacco: Former   Tobacco comments:    quit in 2019  Substance and Sexual Activity   Alcohol use: Never   Drug use: Never   Sexual activity: Not on file  Other Topics Concern   Not on file  Social History Narrative   Not on file   Social Determinants of Health   Financial Resource Strain: Low Risk    Difficulty of Paying Living Expenses: Not very hard  Food Insecurity: Food Insecurity Present   Worried About [place] in the Last Year: Never true   Ran Out of Food in the Last Year: Sometimes true  Transportation Needs: No Transportation Needs   Lack of Transportation (Medical): No   Lack of Transportation (Non-Medical): No  Physical Activity: Not on file  Stress: Not on file  Social Connections: Not on file   Scheduled Meds:  (feeding supplement) PROSource Plus  30 mL Oral TID BM   benzonatate  100 mg Oral BID   chlorhexidine  15 mL Mouth Rinse BID   Chlorhexidine Gluconate Cloth  6 each Topical Daily   glycerin (Pediatric)  2 suppository Rectal Once   insulin aspart  0-5 Units Subcutaneous QHS   insulin aspart  0-9 Units Subcutaneous TID WC   mouth rinse  15 mL Mouth Rinse q12n4p   midodrine  15 mg Oral TID WC   multivitamin  1 tablet Oral QHS   pantoprazole  40 mg Oral Daily   polyethylene glycol  17 g Oral BID   senna-docusate  2 tablet Oral BID   sodium chloride flush  10-40 mL Intracatheter Q12H   sodium chloride flush  3 mL Intravenous Q12H   Continuous Infusions:   prismasol BGK 4/2.5 400 mL/hr at 02/25/21 1841    prismasol BGK 4/2.5 200 mL/hr at 02/26/21 0756   sodium chloride 10 mL/hr at 02/26/21 1300   amiodarone 30 mg/hr (02/26/21 1300)   DOBUTamine 7.5 mcg/kg/min (02/26/21 1300)   ferric  gluconate (FERRLECIT) IVPB Stopped (02/26/21 1023)   heparin 2,900 Units/hr (02/26/21 1300)   norepinephrine (LEVOPHED) Adult infusion 6 mcg/min (02/26/21 1300)    prismasol BGK 4/2.5 3,000 mL/hr at 02/26/21 1337   PRN Meds:.sodium chloride, acetaminophen **OR** acetaminophen, alum & mag hydroxide-simeth, diphenhydrAMINE, guaiFENesin-dextromethorphan, heparin, heparin, magnesium hydroxide, menthol-cetylpyridinium, ondansetron (ZOFRAN) IV, oxyCODONE, sodium chloride, sodium chloride flush, sodium chloride flush Medications Prior to Admission:  Prior to Admission medications   Medication Sig Start Date End Date Taking? Authorizing Provider  amiodarone (PACERONE) 200 MG tablet Take 80 mg by mouth daily. 12/18/20  Yes [provider]  amoxicillin (AMOXIL) 500 MG capsule Take 500 mg by mouth 3 (three) times daily. 02/07/21  Yes [provider]  apixaban (ELIQUIS) 5 MG TABS tablet Take 1 tablet (5 mg total) by mouth 2 (two) times daily. 11/07/20  Yes Clegg, Amy D, NP  dapagliflozin propanediol (FARXIGA) 10 MG TABS tablet Take 1 tablet (10 mg total) by mouth daily. 04/16/20  Yes Bensimhon, Shaune Pascal, MD  digoxin (LANOXIN) 0.125 MG tablet Take 1 tablet (0.125 mg total) by mouth daily. 04/16/20  Yes Bensimhon, Shaune Pascal, MD  Potassium Chloride ER 20 MEQ TBCR Take 40 mEq by mouth 2 (two) times daily. 11/21/20 02/11/21 Yes Bensimhon, Shaune Pascal, MD  sacubitril-valsartan (ENTRESTO) 97-103 MG Take 1 tablet by mouth 2 (two) times daily.   Yes [provider]  spironolactone (ALDACTONE) 25 MG tablet TAKE 1 TABLET(25 MG) BY MOUTH DAILY Patient taking differently: Take 25 mg by mouth daily. TAKE 1 TABLET(25 MG) BY MOUTH DAILY 12/23/20  Yes Bensimhon, Shaune Pascal, MD  Torsemide 40 MG TABS Take 80 mg by mouth daily. 12/24/20  Yes Clegg, Amy D, NP   Allergies  Allergen Reactions   Coreg [Carvedilol] Shortness Of Breath and Diarrhea    Wheezing    Review of Systems  Constitutional:  Positive for activity change.  Respiratory:  Positive for shortness of breath.   Gastrointestinal:  Positive for abdominal distention, constipation and vomiting. Negative for  abdominal pain and nausea.  Psychiatric/Behavioral:  Positive for sleep disturbance.    Physical Exam Vitals and nursing note reviewed.  Cardiovascular:     Rate and Rhythm: Tachycardia present. Rhythm irregular.  Pulmonary:     Comments: RR rate increased at rest, audibly SOB with discussion Abdominal:     General: There is distension.  Musculoskeletal:        General: No swelling.  Skin:    General: Skin is warm and dry.  Neurological:     Mental Status: He is alert and oriented to person, place, and time.  Psychiatric:        Mood and Affect: Mood normal.     Comments: Insight is fair, judgement fair    Vital Signs: BP (!) 110/53   Pulse 88   Temp (!) 97 F (36.1 C)   Resp 19   Ht _0  (1.803 m)   Wt (!) 197.2 kg   SpO2 95%   BMI 60.63 kg/m  Pain Scale: 0-10   Pain Score: 0-No pain   SpO2: SpO2: 95 % O2 Device:SpO2: 95 % O2 Flow Rate: .O2 Flow Rate (L/min): 6 L/min  IO: Intake/output summary:  Intake/Output Summary (Last 24 hours) at 02/26/2021 1358 Last data filed at 02/26/2021 1300 Gross per 24 hour  Intake 4727.87 ml  Output 9085 ml  Net -4357.13 ml    LBM: Last BM Date: 02/22/21 Baseline Weight: Weight: (!) 215.5 kg Most recent weight: Weight: Marland Kitchen)  197.2 kg     Palliative Assessment/Data: PPS: 50%     Thank you for this consult. Palliative medicine will continue to follow and assist as needed.   Time In: 1232 Time Out: 1447 Time Total: 132 minutes Greater than 50%  of this time was spent counseling and coordinating care related to the above assessment and plan.  Signed by: Mariana Kaufman, AGNP-C Palliative Medicine    Please contact Palliative Medicine Team phone at [phone] for questions and concerns.  For individual provider: See Shea Evans

## 2021-02-26 NOTE — Progress Notes (Addendum)
Patient ID: Phillip Young, male   DOB: July 29, 1985, 35 y.o.   MRN: [id number]     Advanced Heart Failure Rounding Note  PCP-Cardiologist: None   Subjective:   8/2 A flutter rate 200s given amio bolus and amio. DBA increased to 7.5.   On DBA 7.5 + NE 6. Co-ox remains persistently low at 44%, despite further titration of NE yesterday.   9L fluid removal off CRRT yesterday but intake also high. Net negative 4.5L for the day. Only 100 cc in UOP. Wt unchanged.   Remains in AFL, 90s   Na low at 124   WBC elevated at 16K but stable. AF.   Feels ok. Main complaint is constipation. On BM in 3 days.   Echo: EF < 20%, severe LV dilation, RV not well visualized.   Objective:   Weight Range: (!) 197.2 kg Body mass index is 60.63 kg/m.   Vital Signs:   Temp:  [97.2 F (36.2 C)-97.6 F (36.4 C)] 97.2 F (36.2 C) (08/04 0700) Pulse Rate:  [91-102] 98 (08/04 0700) Resp:  [16-35] 27 (08/04 0700) BP: (53-125)/(25-101) 86/61 (08/04 0700) SpO2:  [97 %-100 %] 100 % (08/04 0700) Weight:  [197.2 kg] 197.2 kg (08/04 0500) Last BM Date: 02/22/21  Weight change: Filed Weights   02/24/21 0800 02/25/21 0600 02/26/21 0500  Weight: (!) 198.7 kg (!) 197.1 kg (!) 197.2 kg    Intake/Output:   Intake/Output Summary (Last 24 hours) at 02/26/2021 0708 Last data filed at 02/26/2021 0700 Gross per 24 hour  Intake 4556.22 ml  Output 9092 ml  Net -4535.78 ml      Physical Exam   General:  obese, laying in bed. No respiratory difficulty HEENT: normal Neck: supple. JVD elevated to jaw. Carotids 2+ bilat; no bruits. No lymphadenopathy or thyromegaly appreciated. Cor: PMI nondisplaced. Irregular rhythm and rate. No rubs, gallops or murmurs. Lungs: decreased BS at the bases  Abdomen: obese, soft, nontender, nondistended. No hepatosplenomegaly. No bruits or masses. Good bowel sounds. Extremities: no cyanosis, clubbing, rash, edema Neuro: alert & oriented x 3, cranial nerves grossly intact. moves all 4  extremities w/o difficulty. Affect pleasant.   Telemetry     flutter 90s (personally reviewed).   Labs    CBC Recent Labs    02/25/21 0419 02/26/21 0429  WBC 16.2* 16.3*  HGB 11.9* 11.8*  HCT 36.9* 35.8*  MCV 92.0 90.4  PLT 233 [id number]   Basic Metabolic Panel Recent Labs    02/25/21 0419 02/25/21 1624 02/26/21 0429  NA 126* 124* 124*  K 4.8 4.9 5.1  CL 90* 91* 89*  CO2 22 20* 20*  GLUCOSE 152* 149* 153*  BUN 21* 20 17  CREATININE 2.31* 2.40* 2.51*  CALCIUM 9.3 8.7* 9.4  MG 2.6*  --  2.7*  PHOS 2.8 2.9 3.0   Liver Function Tests Recent Labs    02/25/21 1624 02/26/21 0429  ALBUMIN 3.5 3.5    No results for input(s): LIPASE, AMYLASE in the last 72 hours. Cardiac Enzymes No results for input(s): CKTOTAL, CKMB, CKMBINDEX, TROPONINI in the last 72 hours.  BNP: BNP (last 3 results) Recent Labs    10/10/20 1336 11/07/20 0932 02/11/21 1500  BNP 126.9* 106.0* 194.3*    ProBNP (last 3 results) No results for input(s): PROBNP in the last 8760 hours.   D-Dimer No results for input(s): DDIMER in the last 72 hours.  Hemoglobin A1C No results for input(s): HGBA1C in the last 72 hours.  Fasting Lipid Panel No  results for input(s): CHOL, HDL, LDLCALC, TRIG, CHOLHDL, LDLDIRECT in the last 72 hours. Thyroid Function Tests No results for input(s): TSH, T4TOTAL, T3FREE, THYROIDAB in the last 72 hours.  Invalid input(s): FREET3   Other results:   Imaging    No results found.   Medications:     Scheduled Medications:  (feeding supplement) PROSource Plus  30 mL Oral TID BM   benzonatate  100 mg Oral BID   chlorhexidine  15 mL Mouth Rinse BID   Chlorhexidine Gluconate Cloth  6 each Topical Daily   insulin aspart  0-5 Units Subcutaneous QHS   insulin aspart  0-9 Units Subcutaneous TID WC   mouth rinse  15 mL Mouth Rinse q12n4p   midodrine  10 mg Oral TID WC   multivitamin  1 tablet Oral QHS   pantoprazole  40 mg Oral Daily   polyethylene glycol  17  g Oral Daily   senna-docusate  2 tablet Oral BID   sodium chloride flush  10-40 mL Intracatheter Q12H   sodium chloride flush  3 mL Intravenous Q12H   sorbitol  45 mL Oral Once    Infusions:   prismasol BGK 4/2.5 400 mL/hr at 02/25/21 1841    prismasol BGK 4/2.5 200 mL/hr at 02/25/21 1409   sodium chloride     amiodarone 60 mg/hr (02/26/21 0700)   DOBUTamine 7.5 mcg/kg/min (02/26/21 0700)   ferric gluconate (FERRLECIT) IVPB Stopped (02/25/21 1045)   heparin 2,900 Units/hr (02/26/21 0700)   norepinephrine (LEVOPHED) Adult infusion 6 mcg/min (02/26/21 0700)   prismasol BGK 4/2.5 3,000 mL/hr at 02/26/21 0655    PRN Medications: sodium chloride, acetaminophen **OR** acetaminophen, alum & mag hydroxide-simeth, diphenhydrAMINE, guaiFENesin-dextromethorphan, heparin, heparin, magnesium hydroxide, menthol-cetylpyridinium, ondansetron (ZOFRAN) IV, sodium chloride, sodium chloride flush, sodium chloride flush  Assessment/Plan   1. Acute on Chronic Biventricular Heart Failure:  Known cardiomyopathy since 2019, previously followed by [facility].  Echo 02/27/2020 with EF < 20%, moderate LV dilation, severely decreased RV function with severe RV dilation, severe biatrial enlargement, mild MR. No history of ETOH/drugs.  No FH of cardiomyopathy (mother with [place]).  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  He is too large for cardiac MRI. RHC/LHC on 03/04/20 with no CAD (nonischemic cardiomyopathy), elevated left and right heart filling pressures, and low CI at 2.1. 05/2020 Echo showed EF 20-25%. Evaluated by EP but not a candidate for ICD given BMI.  Echo this admission with EF < 20%, RV poorly visualized. He now has CHF exacerbation likely in setting of AFL with RVR.  He was hypotensive with poor UOP, concern for cardiogenic shock.  Dobutamine therefore started at 2.5 and increased to 7.5, NE added at 6 currently.  With progressive renal failure, CVVH started.   This morning, co-ox 44%. Continues on CVVH for fluid removal but wt unchanged. Fluid intake high.  - Continue dobutamine 7.5 and NE 6 mcg. C/w midodrine 10 mg tid  -Continue CVVH for fluid offloading, aim for 150 cc/hr net today (BP stable).   - Continues to drink lots of fluids. Discussed limiting fluid.  - With AKI/hypotension, stopped Entresto, dapagliflozin, spironolactone, digoxin.  - Need attempt at DCCV to restore NSR, once volume better.  - With marked obesity, he is not a candidate for advanced therapies.  2. Aflutter w/ RVR: Noted to be in Afib prior admit, 3/22, in setting of CAP and a/c CHF but converted to NSR on amiodarone. Discharged home w/ eliquis. In 2:1 AFL this  admit in setting of a/c CHF w/ marked volume overload, + recent gastroenteritis w/ diarrhea. Has OSA but not using CPAP. TSH WNL. Admits to intermittent compliance w/ Eliquis. Has missed several doses in the last month.  - c/w amio drip 60 mg per hour.  - Continue Eliquis 5 mg bid.  - Plan TEE/DCCV when more fluid off. Discussed risks/benefits with patient and he agrees to procedure.  Not sure he can get this because he wont stop drinking fluids.  3. Acute Hypoxic Respiratory Failure: Suspect underlying OHS/OSA exacerbated by CHF this admission.  - Continue oxygen + CPAP.  4. Type 2 DM: Farxiga held with AKI.  5. OSA: Has not been on Bipap at home.  6. Super Morbid Obesity: Body mass index is 60.63 kg/m. 7. Tooth Abscess: Completed ceftriaxone. 8. UTI: Suspect by UA, sent urine culture.  ?Urinary obstruction related to this => no hydronephrosis on renal US.  - Ceftriaxone IV course completed.  9. Elevated Hs Trop: Suspect demand ischemia in setting of CHF + rapid AFL. Cath 8/21 showed no CAD.  10. AKI: Creatinine peaked to 3.32 though BUN not markedly high.  Concern for cardiorenal syndrome.  Renal US with no hydronephrosis. Remains on CVVHD, pulling 150 cc/hr.  Not as much UOP over the last day. Concerned he will end  up HD-dependent.  - Nephrology appreciated.  - UF 150 today as long as BP continues to tolerate.  11. Anemia: Hgb 11.8 today.  - Received IV Fe.  12. Cough: CXR with CHF.  - Improved.  13. Hyponatremia: Na 124 - on CVVHD, management per nephrology  14. ID: WBC elevated but stable at 16K. AF - BCx pending   Length of Stay: [address] PA-C  02/26/2021, 7:08 AM  Advanced Heart Failure Team Pager [phone] (M-F; 7a - 5p)  Please contact [facility] for night-coverage after hours (5p -7a ) and weekends on [email or web address]   Patient seen with NP, agree with the above note.   I/Os net negative 4535 via CVVH.  CVP 15.  UOP less. Co-ox 44% this morning on dobutamine 7.5 + NE 6.  Remains in atrial flutter rate better in 90s on amiodarone 60 mg/hr.   WBCs stable at 16.    General: NAD, obese Neck: JVP 14+, no thyromegaly or thyroid nodule.  Lungs: Clear to auscultation bilaterally with normal respiratory effort. CV: Nondisplaced PMI.  Heart regular S1/S2, no S3/S4, no murmur.  Trace ankle edema.  Abdomen: Soft, nontender, no hepatosplenomegaly, no distention.  Skin: Intact without lesions or rashes.  Neurologic: Alert and oriented x 3.  Psych: Normal affect. Extremities: No clubbing or cyanosis.  HEENT: Normal.     Continue CVVH, CVP down to 15.  Will aim for net negative UF 100 cc/hr today.  Think we are getting close.  Increase midodrine to 15 mg tid, continue dobutamine and NE 6, repeat co-ox today.   Still think TEE-DCCV is going to be high risk, still very hard to lie flat without CPAP.  Continue CVVH today, volume status hopefully will be optimized by tomorrow.  Will discuss with Dr. Valeta Harms, will aim for DCCV without TEE, understanding risk is higher for CVA but TEE may be too dangerous for him in terms of aspiration risk and intubation (would be very hard to get him extubated). Keep NPO after midnight.   Have discussed code status with him still wants to be full code but  willing to talk with palliative care.  I fear that  if we intubate him we will not be able to get him extubated.  CRITICAL CARE Performed by: [name]  Total critical care time: 35 minutes  Critical care time was exclusive of separately billable procedures and treating other patients.  Critical care was necessary to treat or prevent imminent or life-threatening deterioration.  Critical care was time spent personally by me on the following activities: development of treatment plan with patient and/or surrogate as well as nursing, discussions with consultants, evaluation of patient's response to treatment, examination of patient, obtaining history from patient or surrogate, ordering and performing treatments and interventions, ordering and review of laboratory studies, ordering and review of radiographic studies, pulse oximetry and re-evaluation of patient's condition.  [name] 02/26/2021 7:49 AM

## 2021-02-26 NOTE — Progress Notes (Signed)
[facility] Progress Note   35 y.o. year-old w/ hx of severe LV/ RV heart failure, super morbid obesity, HTN, OSA presented on 7/20 w/ SOB x 1 week, c/w CHF ->  IV lasix started and pt admitted. Atrial w/ RVR may have been cause of decomp CHF per cardiology team.  ECHO showed EF <20%. BP's dropped and pt moved to ICU for suspected cardiogenic shock. Started on IV dobutamine and norepi gtt's. CRRT started 7/24.  Assessment/ Plan:   AKI - creat 1.2 on admit, up to 3.3 in setting on cardiogenic shock, long-standing hx of severe bivent CHF, now w/ aflutter/ RVR.  Vol overload refractory to inotropes, pressors and IV lasix gtt ->  CRRT 7/24 ->    Seen on CRRT RIJ temp 400/200/3000 pre/post/qd Net UF 150-200 currently on Levophed and dobutamine Continues to be net negative daily  Filter clotting improved with heparin.  CRRT DFR was adjusted for hyponatremia as below. Continue with aggressive volume removal.   Vol overload -related to heart failure.  Volume removal as above. RV/ LV heart failure - per cardiology / CHF team -> waiting to optimize fluid status (markedly overloaded) prior to attempting DCCV.  Continue dobutamine and norepinephrine per primary team.  Holding home medications because of hypotension.  Palliative care involved given difficult clinical situation and possibility of no recovery UTI status post ceftriaxone Morbid obesity. DM -management per primary Hyponatremia: Associated with fluid overload and high free water intake.  Continue current high DFR to help combat hyponatremia Iron deficiency anemia: Hemoglobin improved greater than 11. Iron deplete.  Finishing up IV iron load  Subjective:   Increased requirement of norepinephrine yesterday.  Patient continues to have extensive fluid intake and does not want to diminish his intake.  -4 L yesterday.  Complains of constipation today.   Objective:   BP (!) 87/72 (BP Location: Left Wrist)   Pulse 88   Temp (!)  97.2 F (36.2 C) (Oral)   Resp (!) 26   Ht '5\' 11"'$  (1.803 m)   Wt (!) 197.2 kg   SpO2 100%   BMI 60.63 kg/m   Intake/Output Summary (Last 24 hours) at 02/26/2021 0913 Last data filed at 02/26/2021 0800 Gross per 24 hour  Intake 4697.65 ml  Output 8688 ml  Net -3990.35 ml   Weight change: -1.5 kg  Physical Exam: Gen sitting in chair morbidly obese, BiPAP Chest bilateral chest rise, distant lung sounds, no rales/ wheezing RRR tachycardia, irregular rhythm Abd soft ntnd no mass or ascites +bs MS no joint effusions or deformity Ext 2+ bilateral lower extremity edema, edema pannus  Imaging: No results found.  Labs: BMET Recent Labs  Lab 02/23/21 0540 02/23/21 1600 02/24/21 0535 02/24/21 1600 02/25/21 0419 02/25/21 1624 02/26/21 0429  NA 129* 127* 123* 120* 126* 124* 124*  K 4.9 4.7 4.7 4.3 4.8 4.9 5.1  CL 97* 92* 90* 87* 90* 91* 89*  CO2 20* 25 23 20* 22 20* 20*  GLUCOSE 135* 210* 259* 381* 152* 149* 153*  BUN '16 16 17 19 '$ 21* 20 17  CREATININE 1.69* 1.82* 2.07* 2.31* 2.31* 2.40* 2.51*  CALCIUM 9.4 9.2 8.9 8.7* 9.3 8.7* 9.4  PHOS 2.9 3.0 2.7 3.0 2.8 2.9 3.0   CBC Recent Labs  Lab 02/23/21 0419 02/23/21 1441 02/24/21 0535 02/25/21 0419 02/26/21 0429  WBC 9.1  --  14.9* 16.2* 16.3*  HGB 8.9* 11.2* 11.4* 11.9* 11.8*  HCT 29.3* 35.3* 36.6* 36.9* 35.8*  MCV 98.0  --  92.9 92.0 90.4  PLT 170  --  245 233 255    Medications:     (feeding supplement) PROSource Plus  30 mL Oral TID BM   benzonatate  100 mg Oral BID   chlorhexidine  15 mL Mouth Rinse BID   Chlorhexidine Gluconate Cloth  6 each Topical Daily   glycerin (Pediatric)  2 suppository Rectal Once   insulin aspart  0-5 Units Subcutaneous QHS   insulin aspart  0-9 Units Subcutaneous TID WC   mouth rinse  15 mL Mouth Rinse q12n4p   midodrine  15 mg Oral TID WC   multivitamin  1 tablet Oral QHS   pantoprazole  40 mg Oral Daily   polyethylene glycol  17 g Oral Daily   senna-docusate  2 tablet Oral BID    sodium chloride flush  10-40 mL Intracatheter Q12H   sodium chloride flush  3 mL Intravenous Q12H      Reesa Chew  02/26/2021, 9:13 AM

## 2021-02-26 NOTE — Plan of Care (Signed)
  Problem: Education: Goal: Knowledge of General Education information will improve Description: Including pain rating scale, medication(s)/side effects and non-pharmacologic comfort measures Outcome: Progressing   Problem: Health Behavior/Discharge Planning: Goal: Ability to manage health-related needs will improve Outcome: Progressing   Problem: Clinical Measurements: Goal: Ability to maintain clinical measurements within normal limits will improve Outcome: Progressing Goal: Will remain free from infection Outcome: Progressing Goal: Diagnostic test results will improve Outcome: Progressing Goal: Respiratory complications will improve Outcome: Progressing Goal: Cardiovascular complication will be avoided Outcome: Progressing   Problem: Activity: Goal: Risk for activity intolerance will decrease Outcome: Progressing   Problem: Coping: Goal: Level of anxiety will decrease Outcome: Progressing   Problem: Elimination: Goal: Will not experience complications related to bowel motility Outcome: Progressing Goal: Will not experience complications related to urinary retention Outcome: Progressing   Problem: Pain Managment: Goal: General experience of comfort will improve Outcome: Progressing   Problem: Safety: Goal: Ability to remain free from injury will improve Outcome: Progressing   Problem: Skin Integrity: Goal: Risk for impaired skin integrity will decrease Outcome: Progressing   Problem: Health Behavior/Discharge Planning: Goal: Ability to manage health-related needs will improve Outcome: Progressing   Problem: Clinical Measurements: Goal: Complications related to the disease process or treatment will be avoided or minimized Outcome: Progressing Goal: Dialysis access will remain free of complications Outcome: Progressing

## 2021-02-26 NOTE — Progress Notes (Signed)
ANTICOAGULATION CONSULT NOTE- Follow-Up Note  Pharmacy Consult for Heparin  Indication: atrial fibrillation  Patient Measurements: Height: '5\' 11"'$  (180.3 cm) Weight: (!) 197.2 kg (434 lb 12 oz) IBW/kg (Calculated) : 75.3 HEPARIN DW (KG): 130.5   Vital Signs: Temp: 97.2 F (36.2 C) (08/04 0700) Temp Source: Oral (08/04 0700) BP: 86/61 (08/04 0700) Pulse Rate: 98 (08/04 0700)  Labs: Recent Labs    02/23/21 1441 02/23/21 1600 02/24/21 0535 02/24/21 0700 02/24/21 1052 02/24/21 1600 02/25/21 0419 02/25/21 1624 02/26/21 0429  HGB 11.2*  --  11.4*  --   --   --  11.9*  --  11.8*  HCT 35.3*  --  36.6*  --   --   --  36.9*  --  35.8*  PLT  --   --  245  --   --   --  233  --  255  APTT 69*   < >  --  63*  --   --  67*  --  84*  LABPROT  --   --   --   --  17.3*  --   --   --   --   INR  --   --   --   --  1.4*  --   --   --   --   HEPARINUNFRC 0.42  --   --   --   --   --  0.37  --  0.43  CREATININE  --    < > 2.07*  --   --    < > 2.31* 2.40* 2.51*   < > = values in this interval not displayed.     Estimated Creatinine Clearance: 72.1 mL/min (A) (by C-G formula based on SCr of 2.51 mg/dL (H)).    Assessment 35 yo male on apixaban for afib. He has acute HF and on CRRT and plans to start heparin. Last dose of apixaban given 7/29 ~ 10pm.   aPTT and heparin level both therapeutic - will stop aPTT monitoring. CBC remains stable, no S/Sx bleeding noted.  Goal of Therapy:  Heparin level 0.3-0.7 units/ml Monitor platelets by anticoagulation protocol: Yes   Plan:  -Continue heparin 2900 units/h -Daily heparin level and CBC   Arrie Senate, PharmD, BCPS, [facility] Clinical Pharmacist [phone] Please check AMION for all [facility] Pharmacy numbers 02/26/2021

## 2021-02-26 NOTE — Plan of Care (Signed)
  Problem: Clinical Measurements: Goal: Dialysis access will remain free of complications Outcome: Progressing   Problem: Fluid Volume: Goal: Fluid volume balance will be maintained or improved Outcome: Not Progressing   Problem: Respiratory: Goal: Respiratory symptoms related to disease process will be avoided Outcome: Progressing   Problem: Urinary Elimination: Goal: Progression of disease will be identified and treated Outcome: Not Progressing

## 2021-02-27 DIAGNOSIS — Z7189 Other specified counseling: Secondary | ICD-10-CM | POA: Diagnosis not present

## 2021-02-27 DIAGNOSIS — I4892 Unspecified atrial flutter: Secondary | ICD-10-CM | POA: Diagnosis not present

## 2021-02-27 DIAGNOSIS — R0602 Shortness of breath: Secondary | ICD-10-CM | POA: Diagnosis not present

## 2021-02-27 DIAGNOSIS — I5023 Acute on chronic systolic (congestive) heart failure: Secondary | ICD-10-CM | POA: Diagnosis not present

## 2021-02-27 LAB — RENAL FUNCTION PANEL
Albumin: 3.3 g/dL — ABNORMAL LOW (ref 3.5–5.0)
Albumin: 3.4 g/dL — ABNORMAL LOW (ref 3.5–5.0)
Albumin: 3.3 g/dL — ABNORMAL LOW (ref 3.5–5.0)
Anion gap: 12 (ref 5–15)
Anion gap: 14 (ref 5–15)
Anion gap: 12 (ref 5–15)
BUN: 15 mg/dL (ref 6–20)
BUN: 17 mg/dL (ref 6–20)
BUN: 19 mg/dL (ref 6–20)
CO2: 25 mmol/L (ref 22–32)
CO2: 20 mmol/L — ABNORMAL LOW (ref 22–32)
CO2: 24 mmol/L (ref 22–32)
Calcium: 8.8 mg/dL — ABNORMAL LOW (ref 8.9–10.3)
Calcium: 9 mg/dL (ref 8.9–10.3)
Calcium: 8.7 mg/dL — ABNORMAL LOW (ref 8.9–10.3)
Chloride: 89 mmol/L — ABNORMAL LOW (ref 98–111)
Chloride: 93 mmol/L — ABNORMAL LOW (ref 98–111)
Chloride: 88 mmol/L — ABNORMAL LOW (ref 98–111)
Creatinine, Ser: 2.72 mg/dL — ABNORMAL HIGH (ref 0.61–1.24)
Creatinine, Ser: 2.79 mg/dL — ABNORMAL HIGH (ref 0.61–1.24)
Creatinine, Ser: 3.09 mg/dL — ABNORMAL HIGH (ref 0.61–1.24)
GFR, Estimated: 30 mL/min — ABNORMAL LOW
GFR, Estimated: 29 mL/min — ABNORMAL LOW
GFR, Estimated: 26 mL/min — ABNORMAL LOW
Glucose, Bld: 114 mg/dL — ABNORMAL HIGH (ref 70–99)
Glucose, Bld: 116 mg/dL — ABNORMAL HIGH (ref 70–99)
Glucose, Bld: 108 mg/dL — ABNORMAL HIGH (ref 70–99)
Phosphorus: 3.9 mg/dL (ref 2.5–4.6)
Phosphorus: 3.6 mg/dL (ref 2.5–4.6)
Phosphorus: 2.8 mg/dL (ref 2.5–4.6)
Potassium: 5.8 mmol/L — ABNORMAL HIGH (ref 3.5–5.1)
Potassium: 5.7 mmol/L — ABNORMAL HIGH (ref 3.5–5.1)
Potassium: 4.9 mmol/L (ref 3.5–5.1)
Sodium: 126 mmol/L — ABNORMAL LOW (ref 135–145)
Sodium: 127 mmol/L — ABNORMAL LOW (ref 135–145)
Sodium: 124 mmol/L — ABNORMAL LOW (ref 135–145)

## 2021-02-27 LAB — CBC
HCT: 36.3 % — ABNORMAL LOW (ref 39.0–52.0)
HCT: 36.6 % — ABNORMAL LOW (ref 39.0–52.0)
HCT: 35.5 % — ABNORMAL LOW (ref 39.0–52.0)
Hemoglobin: 11.6 g/dL — ABNORMAL LOW (ref 13.0–17.0)
Hemoglobin: 11.9 g/dL — ABNORMAL LOW (ref 13.0–17.0)
Hemoglobin: 11.4 g/dL — ABNORMAL LOW (ref 13.0–17.0)
MCH: 29.5 pg (ref 26.0–34.0)
MCH: 29.6 pg (ref 26.0–34.0)
MCH: 29.6 pg (ref 26.0–34.0)
MCHC: 32 g/dL (ref 30.0–36.0)
MCHC: 32.5 g/dL (ref 30.0–36.0)
MCHC: 32.1 g/dL (ref 30.0–36.0)
MCV: 92.4 fL (ref 80.0–100.0)
MCV: 91 fL (ref 80.0–100.0)
MCV: 92.2 fL (ref 80.0–100.0)
Platelets: 277 K/uL (ref 150–400)
Platelets: 258 10*3/uL (ref 150–400)
Platelets: 270 10*3/uL (ref 150–400)
RBC: 3.93 MIL/uL — ABNORMAL LOW (ref 4.22–5.81)
RBC: 4.02 MIL/uL — ABNORMAL LOW (ref 4.22–5.81)
RBC: 3.85 MIL/uL — ABNORMAL LOW (ref 4.22–5.81)
RDW: 15 % (ref 11.5–15.5)
RDW: 15.2 % (ref 11.5–15.5)
RDW: 15.3 % (ref 11.5–15.5)
WBC: 19.2 K/uL — ABNORMAL HIGH (ref 4.0–10.5)
WBC: 16.9 10*3/uL — ABNORMAL HIGH (ref 4.0–10.5)
WBC: 21 10*3/uL — ABNORMAL HIGH (ref 4.0–10.5)
nRBC: 0 % (ref 0.0–0.2)
nRBC: 0 % (ref 0.0–0.2)
nRBC: 0.3 % — ABNORMAL HIGH (ref 0.0–0.2)

## 2021-02-27 LAB — GLUCOSE, CAPILLARY
Glucose-Capillary: 168 mg/dL — ABNORMAL HIGH (ref 70–99)
Glucose-Capillary: 368 mg/dL — ABNORMAL HIGH (ref 70–99)
Glucose-Capillary: 187 mg/dL — ABNORMAL HIGH (ref 70–99)
Glucose-Capillary: 140 mg/dL — ABNORMAL HIGH (ref 70–99)

## 2021-02-27 LAB — HEPARIN LEVEL (UNFRACTIONATED)
Heparin Unfractionated: 0.43 IU/mL (ref 0.30–0.70)
Heparin Unfractionated: 0.38 [IU]/mL (ref 0.30–0.70)

## 2021-02-27 LAB — MAGNESIUM
Magnesium: 2.6 mg/dL — ABNORMAL HIGH (ref 1.7–2.4)
Magnesium: 2.5 mg/dL — ABNORMAL HIGH (ref 1.7–2.4)
Magnesium: 2.5 mg/dL — ABNORMAL HIGH (ref 1.7–2.4)

## 2021-02-27 LAB — COOXEMETRY PANEL
Carboxyhemoglobin: 1.3 % (ref 0.5–1.5)
Methemoglobin: 0.8 % (ref 0.0–1.5)
O2 Saturation: 49.7 %
Total hemoglobin: 11.8 g/dL — ABNORMAL LOW (ref 12.0–16.0)

## 2021-02-27 MED ORDER — DEXMEDETOMIDINE HCL IN NACL 400 MCG/100ML IV SOLN
0.4000 ug/kg/h | INTRAVENOUS | Status: DC
  Administered 2021-02-27: 0.7 ug/kg/h via INTRAVENOUS
  Filled 2021-02-27: qty 100

## 2021-02-27 MED ORDER — PRISMASOL BGK 0/2.5 32-2.5 MEQ/L EC SOLN
Status: DC
  Filled 2021-02-27 (×9): qty 5000

## 2021-02-27 MED ORDER — PRISMASOL BGK 0/2.5 32-2.5 MEQ/L EC SOLN
Status: DC
  Filled 2021-02-27 (×51): qty 5000

## 2021-02-27 MED ORDER — PRISMASOL BGK 0/2.5 32-2.5 MEQ/L EC SOLN
Status: DC
  Filled 2021-02-27 (×8): qty 5000

## 2021-02-27 MED ORDER — MIDAZOLAM HCL 2 MG/2ML IJ SOLN
2.0000 mg | Freq: Once | INTRAMUSCULAR | Status: AC
  Administered 2021-02-27: 2 mg via INTRAVENOUS

## 2021-02-27 MED ORDER — KETAMINE HCL 50 MG/5ML IJ SOSY
PREFILLED_SYRINGE | INTRAMUSCULAR | Status: AC
  Filled 2021-02-27: qty 5

## 2021-02-27 MED ORDER — ETOMIDATE 2 MG/ML IV SOLN
INTRAVENOUS | Status: AC
  Filled 2021-02-27: qty 20

## 2021-02-27 MED ORDER — ALTEPLASE 2 MG IJ SOLR
2.0000 mg | Freq: Once | INTRAMUSCULAR | Status: AC
  Administered 2021-02-27: 2 mg

## 2021-02-27 MED ORDER — ROCURONIUM BROMIDE 10 MG/ML (PF) SYRINGE
PREFILLED_SYRINGE | INTRAVENOUS | Status: AC
  Filled 2021-02-27: qty 10

## 2021-02-27 MED ORDER — FENTANYL CITRATE (PF) 100 MCG/2ML IJ SOLN
100.0000 ug | Freq: Once | INTRAMUSCULAR | Status: DC
Start: 2021-02-27 — End: 2021-03-02
  Filled 2021-02-27: qty 4

## 2021-02-27 MED ORDER — POLYETHYLENE GLYCOL 3350 17 G PO PACK
17.0000 g | PACK | Freq: Every day | ORAL | Status: DC
  Administered 2021-02-27 – 2021-02-28 (×3): 17 g via ORAL
  Filled 2021-02-27 (×3): qty 1

## 2021-02-27 MED ORDER — FENTANYL CITRATE (PF) 100 MCG/2ML IJ SOLN
INTRAMUSCULAR | Status: AC
  Filled 2021-02-27: qty 2

## 2021-02-27 MED ORDER — FENTANYL CITRATE (PF) 100 MCG/2ML IJ SOLN
100.0000 ug | Freq: Once | INTRAMUSCULAR | Status: AC
  Administered 2021-02-27: 100 ug via INTRAVENOUS

## 2021-02-27 MED ORDER — MIDAZOLAM HCL 2 MG/2ML IJ SOLN
INTRAMUSCULAR | Status: AC
  Filled 2021-02-27: qty 2

## 2021-02-27 MED ORDER — ALTEPLASE 2 MG IJ SOLR
2.0000 mg | Freq: Once | INTRAMUSCULAR | Status: AC
  Administered 2021-02-27: 2 mg
  Filled 2021-02-27: qty 2

## 2021-02-27 NOTE — Progress Notes (Signed)
Brief Nutrition Follow Up   RD consulted to provide weight loss education.   Patient remains in ICU on CRRT. Underwent cardioversion this am. PO intake has dropped due to breathing difficulty and loss of appetite. No meal intakes documented since 8/5. RD observed lunch tray with a few bites taken. Patient having difficulty understanding fluid restriction and which items are counted towards his fluid intake.   RD discussed the importance of protein and kcal intake while on CRRT. Encouraged patient to eat balanced meals with protein, vegetables, and grains. Also, discussed the importance of compliance with fluid restriction. Listed all items that count. Patient seems motivated to make changes.   Weight loss was not discussed as this is typically is addressed in an outpatient setting versus critical care setting.   Mariana Single MS, RD, LDN, CNSC Clinical Nutrition Pager listed in [place]

## 2021-02-27 NOTE — Progress Notes (Addendum)
PCCM:  Asked to assist Dr. [name] for bedside cardioversion.  Discussed with patient the use of moderate sedation to help with comfort during the cardioversion.  The patient is agreeable to proceed.  We will have all necessary standby equipment for airway management if needed.   Garner Nash, DO [facility] 02/27/2021 7:49 AM     Time out performed Patient was already on precedex infusion for agitation  He was given 164mg of fentanyl and '2mg'$  of versed  Moderate sedation was successfully achieved  DCCV cardioversion was successful with one attempt.  15 mins of moderate sedation time was complete. He was placed on CPAP after the procedure.   BGarner Nash DO [facility] 02/27/2021 8:16 AM

## 2021-02-27 NOTE — Progress Notes (Addendum)
VAST consulted to declot R IJ temp W[place]. Due to pressors running through CRRT, PIV needed while attempting to declot [place]. Physician and ICU nurse requesting midline. Provided education to ICU RN, Misha, that pressors should not infuse through midline d/t increased risk for extravasation. She verbalized pressors would be given through PIV and heparin would be administered through midline.  While setting up to assess arm with ultrasound, ICU RN getting patient back to bed. Patient started coughing and jumped up to side of bed. Patient vomiting and became unresponsive with eyes open falling back into bed. Help summoned to get patient back into bed and assess status; labs drawn with IV start.  VAST RN assessed right arm utilizing ultrasound. Only appropriate vessel in lower arm already with PIV in place. Upper arm assessed for possible midline placement. Pt previously had a PICC in right upper arm. Unfortunately, the brachial vessel which was large enough for midline placement was non-compressible mid upper arm; therefore, unable to place midline. Instead, placed a 20g PIV with ultrasound proximal to non-compressible area. Dobutamine and Amiodarone infusing through this line while heparin infusing through peripheral in lower right arm.  Left arm is restricted for possible AVF placement in future.

## 2021-02-27 NOTE — Progress Notes (Signed)
PT Cancellation Note  Patient Details Name: Phillip Young MRN: [id number] DOB: 08/23/1985   Cancelled Treatment:    Reason Eval/Treat Not Completed: Other (comment) (pt remains on CRRT and refuses therapy on CRRT. Will sign off and please reorder when pt willing to participate)   Dondrell Loudermilk B Cayce Paschal 02/27/2021, 7:36 AM Bayard Males, PT Acute Rehabilitation Services Pager: [phone] Office: [phone]

## 2021-02-27 NOTE — Progress Notes (Signed)
Pt placed on BIPAP after cardioversion per MD order. Pt is tolerating well at this time. RT to continue to monitor.

## 2021-02-27 NOTE — Progress Notes (Signed)
   02/27/21 1700  Clinical Encounter Type  Visited With Patient not available  Visit Type Code  Referral From Nurse  Consult/Referral To Chaplain  The chaplain responded to a page for a code. The patient is being assessed and unavailable. No chaplain services are needed at this time. The chaplain will follow up as needed.

## 2021-02-27 NOTE — Interval H&P Note (Signed)
History and Physical Interval Note:  02/27/2021 7:42 AM  Phillip Young  has presented today for surgery, with the diagnosis of afib.  The various methods of treatment have been discussed with the patient and family. After consideration of risks, benefits and other options for treatment, the patient has consented to  Procedure(s): CANCELLED PROCEDURE as a surgical intervention.  The patient's history has been reviewed, patient examined, no change in status, stable for surgery.  I have reviewed the patient's chart and labs.  Questions were answered to the patient's satisfaction.     Santana Edell [employer]

## 2021-02-27 NOTE — Progress Notes (Signed)
Pt was in bed waiting for IV team to place a line when he jumped out of bed and started vomiting. His eyes rolled to the back of his head and he became unresponsive. He fell back into his bed and a code was called. He became responsive within seconds and was helped back into bed. IV was placed and labs were drawn. Will continue to monitor.

## 2021-02-27 NOTE — Plan of Care (Signed)

## 2021-02-27 NOTE — H&P (View-Only) (Signed)
Patient ID: Phillip Young, male   DOB: 05-09-1986, 35 y.o.   MRN: [id number]     Advanced Heart Failure Rounding Note  PCP-Cardiologist: None   Subjective:    On DBA 7.5 + NE 6. Co-ox remains marginal at 50%.  Additional 9L removal off CRRT yesterday. Oliguric w/ only 50 cc in UOP. Wt down 5 lb. CVP 8   SCr 2.51>>2.72 K 5.8   Remains in AFL, 90s   Na 124>>126   WBC rising, 16>>19K but AF. Blood cultures NGTD.   Feeling better. No resting dyspnea. Had BM yesterday.    Echo: EF < 20%, severe LV dilation, RV not well visualized.   Objective:   Weight Range: (!) 194.6 kg Body mass index is 59.84 kg/m.   Vital Signs:   Temp:  [97 F (36.1 C)-98.3 F (36.8 C)] 97.4 F (36.3 C) (08/05 0610) Pulse Rate:  [80-101] 88 (08/05 0655) Resp:  [15-38] 25 (08/05 0655) BP: (87-115)/(34-101) 103/73 (08/05 0655) SpO2:  [93 %-100 %] 99 % (08/05 0655) Weight:  [194.6 kg] 194.6 kg (08/05 0610) Last BM Date: 02/26/21  Weight change: Filed Weights   02/25/21 0600 02/26/21 0500 02/27/21 0610  Weight: (!) 197.1 kg (!) 197.2 kg (!) 194.6 kg    Intake/Output:   Intake/Output Summary (Last 24 hours) at 02/27/2021 0709 Last data filed at 02/27/2021 0700 Gross per 24 hour  Intake 4556.55 ml  Output 9029 ml  Net -4472.45 ml      Physical Exam   CVP 8  General:  Well appearing, obese. No respiratory difficulty HEENT: normal Neck: supple. Thick neck, JVD not well visualized. Carotids 2+ bilat; no bruits. No lymphadenopathy or thyromegaly appreciated. Cor: PMI nondisplaced. Irregular rhythm and rate. No rubs, gallops or murmurs. Lungs: clear Abdomen: obese, soft, nontender, nondistended. No hepatosplenomegaly. No bruits or masses. Good bowel sounds. Extremities: no cyanosis, clubbing, rash, edema Neuro: alert & oriented x 3, cranial nerves grossly intact. moves all 4 extremities w/o difficulty. Affect pleasant.  Telemetry     flutter 90s (personally reviewed).   Labs     CBC Recent Labs    02/26/21 0429 02/27/21 0500  WBC 16.3* 19.2*  HGB 11.8* 11.6*  HCT 35.8* 36.3*  MCV 90.4 92.4  PLT 255 [id number]   Basic Metabolic Panel Recent Labs    02/26/21 0429 02/26/21 1600 02/27/21 0500  NA 124* 128* 126*  K 5.1 4.7 5.8*  CL 89* 92* 89*  CO2 20* 24 25  GLUCOSE 153* 190* 114*  BUN '17 15 15  '$ CREATININE 2.51* 2.32* 2.72*  CALCIUM 9.4 8.7* 8.8*  MG 2.7*  --  2.6*  PHOS 3.0 2.1* 3.9   Liver Function Tests Recent Labs    02/26/21 1600 02/27/21 0500  ALBUMIN 3.4* 3.3*    No results for input(s): LIPASE, AMYLASE in the last 72 hours. Cardiac Enzymes No results for input(s): CKTOTAL, CKMB, CKMBINDEX, TROPONINI in the last 72 hours.  BNP: BNP (last 3 results) Recent Labs    10/10/20 1336 11/07/20 0932 02/11/21 1500  BNP 126.9* 106.0* 194.3*    ProBNP (last 3 results) No results for input(s): PROBNP in the last 8760 hours.   D-Dimer No results for input(s): DDIMER in the last 72 hours.  Hemoglobin A1C No results for input(s): HGBA1C in the last 72 hours.  Fasting Lipid Panel No results for input(s): CHOL, HDL, LDLCALC, TRIG, CHOLHDL, LDLDIRECT in the last 72 hours. Thyroid Function Tests No results for input(s): TSH, T4TOTAL, T3FREE, THYROIDAB in  the last 72 hours.  Invalid input(s): FREET3   Other results:   Imaging    No results found.   Medications:     Scheduled Medications:  (feeding supplement) PROSource Plus  30 mL Oral TID BM   benzonatate  100 mg Oral BID   chlorhexidine  15 mL Mouth Rinse BID   Chlorhexidine Gluconate Cloth  6 each Topical Daily   glycerin (Pediatric)  2 suppository Rectal Once   insulin aspart  0-5 Units Subcutaneous QHS   insulin aspart  0-9 Units Subcutaneous TID WC   mouth rinse  15 mL Mouth Rinse q12n4p   midodrine  15 mg Oral TID WC   multivitamin  1 tablet Oral QHS   pantoprazole  40 mg Oral Daily   polyethylene glycol  17 g Oral BID   senna-docusate  2 tablet Oral BID   sodium  chloride flush  10-40 mL Intracatheter Q12H   sodium chloride flush  3 mL Intravenous Q12H    Infusions:   prismasol BGK 4/2.5 400 mL/hr at 02/26/21 2100    prismasol BGK 4/2.5 200 mL/hr at 02/26/21 0756   sodium chloride 10 mL/hr at 02/27/21 0700   amiodarone 30 mg/hr (02/27/21 0700)   DOBUTamine 7.5 mcg/kg/min (02/27/21 0700)   ferric gluconate (FERRLECIT) IVPB Stopped (02/26/21 1023)   heparin 2,900 Units/hr (02/27/21 0700)   norepinephrine (LEVOPHED) Adult infusion 6 mcg/min (02/27/21 0700)   prismasol BGK 4/2.5 3,000 mL/hr at 02/27/21 0620    PRN Medications: sodium chloride, acetaminophen **OR** acetaminophen, alum & mag hydroxide-simeth, diphenhydrAMINE, guaiFENesin-dextromethorphan, heparin, heparin, menthol-cetylpyridinium, ondansetron (ZOFRAN) IV, oxyCODONE, sodium chloride, sodium chloride flush, sodium chloride flush  Assessment/Plan   1. Acute on Chronic Biventricular Heart Failure:  Known cardiomyopathy since 2019, previously [facility].  Echo 02/27/2020 with EF < 20%, moderate LV dilation, severely decreased RV function with severe RV dilation, severe biatrial enlargement, mild MR. No history of ETOH/drugs.  No FH of cardiomyopathy (mother with Evans).  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  He is too large for cardiac MRI. RHC/LHC on 03/04/20 with no CAD (nonischemic cardiomyopathy), elevated left and right heart filling pressures, and low CI at 2.1. 05/2020 Echo showed EF 20-25%. Evaluated by EP but not a candidate for ICD given BMI.  Echo this admission with EF < 20%, RV poorly visualized. He now has CHF exacerbation likely in setting of AFL with RVR.  He was hypotensive with poor UOP, concern for cardiogenic shock.  Dobutamine therefore started at 2.5 and increased to 7.5, NE added at 6 currently.  With progressive renal failure, CVVH started.  This morning, co-ox 50%. Continues on CVVH for fluid removal. Overall,  down 60 lb. CVP 8. Remains in ALF - Plan attempt at [place] today  - Continue dobutamine 7.5 and wean NE today as tolerated. C/w midodrine 15 mg tid - Unfortunately, suspect he will be HD dependent - With AKI/hypotension, stopped Entresto, dapagliflozin, spironolactone, digoxin.  - With marked obesity, he is not a candidate for advanced therapies.  2. Aflutter w/ RVR: Noted to be in Afib prior admit, 3/22, in setting of CAP and a/c CHF but converted to NSR on amiodarone. Discharged home w/ eliquis. In 2:1 AFL this admit in setting of a/c CHF w/ marked volume overload, + recent gastroenteritis w/ diarrhea. Has OSA but not using CPAP. TSH WNL. Admits to intermittent compliance w/ Eliquis. Has missed several doses in the last month.  - c/w amio drip 60  mg per hour.  - Continue Eliquis 5 mg bid.  - Plan attempt at [place] today  3. Acute Hypoxic Respiratory Failure: Suspect underlying OHS/OSA exacerbated by CHF this admission.  - Continue oxygen + CPAP.  4. Type 2 DM: Farxiga held with AKI.  5. OSA: Has not been on Bipap at home.  6. Super Morbid Obesity: Body mass index is 59.84 kg/m. 7. Tooth Abscess: Completed ceftriaxone. 8. UTI: Suspect by UA, sent urine culture.  ?Urinary obstruction related to this => no hydronephrosis on renal US.  - Ceftriaxone IV course completed.  9. Elevated Hs Trop: Suspect demand ischemia in setting of CHF + rapid AFL. Cath 8/21 showed no CAD.  10. AKI: Creatinine peaked to 3.32 though BUN not markedly high.  Concern for cardiorenal syndrome.  Renal US with no hydronephrosis. Remains on CVVHD, pulling 150 cc/hr.  Not as much UOP over the last day. Concerned he will end up HD-dependent.  - Nephrology appreciated.  11. Anemia: Hgb 11.6 today.  - Received IV Fe.  12. Cough: CXR with CHF.  - Improved.  13. Hyponatremia: Na 126 - on CVVHD, management per nephrology  14. ID: WBC elevated but stable at 19K. AF - BCx NGTD  15. Hyperkalemia - K 5.8 in setting of renal  failure - c/w CVVHD   Length of Stay: [address] PA-C  02/27/2021, 7:09 AM  Advanced Heart Failure Team Pager [phone] (M-F; 7a - 5p)  Please contact [facility] for night-coverage after hours (5p -7a ) and weekends on [email or web address]   Patient seen with NP, agree with the above note.   I/Os net negative 4472 via CVVH.  CVP 8.  UOP 50. Co-ox 50% this morning on dobutamine 7.5 + NE 6.  Remains in atrial flutter rate better in 90s on amiodarone 30 mg/hr.   WBCs 16 => 19, blood cultures negative.    General: NAD, obese.  Neck: Thick. No JVD, no thyromegaly or thyroid nodule.  Lungs: Clear to auscultation bilaterally with normal respiratory effort. CV: Nondisplaced PMI.  Heart regular S1/S2, no S3/S4, no murmur.  Trace ankle edema.  Abdomen: Soft, nontender, no hepatosplenomegaly, no distention.  Skin: Intact without lesions or rashes.  Neurologic: Alert and oriented x 3.  Psych: Normal affect. Extremities: No clubbing or cyanosis.  HEENT: Normal.   Volume status looks optimized with CVP 8.  Keep CVVH running even today.  Continue midodrine 15 mg tid, dobutamine 7.5, and NE 6 for now.  I am concerned that his renal function is not going to recover.    Aim for DCCV without TEE, understanding risk is higher for CVA but TEE may be too dangerous for him in terms of aspiration risk and intubation (would be very hard to get him extubated). Planning for this morning.    He is having ongoing code discussions with palliative care.   CRITICAL CARE Performed by: [name]  Total critical care time: 40 minutes  Critical care time was exclusive of separately billable procedures and treating other patients.  Critical care was necessary to treat or prevent imminent or life-threatening deterioration.  Critical care was time spent personally by me on the following activities: development of treatment plan with patient and/or surrogate as well as nursing, discussions with  consultants, evaluation of patient's response to treatment, examination of patient, obtaining history from patient or surrogate, ordering and performing treatments and interventions, ordering and review of laboratory studies, ordering and review of radiographic studies, pulse oximetry and re-evaluation  of patient's condition.  [name] 02/27/2021 7:41 AM

## 2021-02-27 NOTE — Progress Notes (Signed)
ANTICOAGULATION CONSULT NOTE- Follow-Up Note  Pharmacy Consult for Heparin  Indication: atrial fibrillation  Patient Measurements: Height: '5\' 11"'$  (180.3 cm) Weight: (!) 194.6 kg (429 lb 0.2 oz) IBW/kg (Calculated) : 75.3 HEPARIN DW (KG): 130.5   Vital Signs: Temp: 97.4 F (36.3 C) (08/05 0610) Temp Source: Oral (08/05 0610) BP: 103/73 (08/05 0655) Pulse Rate: 88 (08/05 0655)  Labs: Recent Labs    02/24/21 1052 02/24/21 1600 02/25/21 0419 02/25/21 1624 02/26/21 0429 02/26/21 1600 02/27/21 0500  HGB  --    < > 11.9*  --  11.8*  --  11.6*  HCT  --   --  36.9*  --  35.8*  --  36.3*  PLT  --   --  233  --  255  --  277  APTT  --   --  67*  --  84*  --   --   LABPROT 17.3*  --   --   --   --   --   --   INR 1.4*  --   --   --   --   --   --   HEPARINUNFRC  --   --  0.37  --  0.43  --  0.43  CREATININE  --    < > 2.31*   < > 2.51* 2.32* 2.72*   < > = values in this interval not displayed.     Estimated Creatinine Clearance: 65.9 mL/min (A) (by C-G formula based on SCr of 2.72 mg/dL (H)).    Assessment 35 yo Young on apixaban for afib. He has acute HF and on CRRT and plans to start heparin. Last dose of apixaban given 7/29 ~ 10pm.   Heparin level remains therapeutic at 0.43, CBC stable.  Goal of Therapy:  Heparin level 0.3-0.7 units/ml Monitor platelets by anticoagulation protocol: Yes   Plan:  -Continue heparin 2900 units/h -Daily heparin level and CBC   Arrie Senate, PharmD, BCPS, [facility] Clinical Pharmacist [phone] Please check AMION for all [facility] Pharmacy numbers 02/27/2021

## 2021-02-27 NOTE — Progress Notes (Signed)
Patient ID: Phillip Young, male   DOB: December 30, 1985, 35 y.o.   MRN: [id number] [facility] Progress Note   Assessment/ Plan:   1. Acute kidney Injury: Secondary to cardiogenic shock/hemodynamically mediated in the setting of atrial flutter with RVR and underlying severe biventricular congestive heart failure.  Anuric at this time and will continue CRRT for volume unloading and make adjustments for hyperkalemia management.  Net -38 L to date with overnight net -4.4 L balance.  Overall prognosis appears poor with burden of underlying cardiac status. 2.  Hyponatremia: Secondary to CHF/impaired free water handling in the setting of acute kidney injury.  Continue ultrafiltration/CRRT. 3.  Hyperkalemia: Medications reviewed, not getting extraneous potassium other than from CRRT prescription-this will be modified. 4.  Biventricular congestive heart failure: Continue CRRT for volume unloading after refractory to diuretics.  Unfortunately not a candidate for advanced therapies due to morbid obesity  5.  Atrial flutter with RVR: Plans noted for DCCV today, on amiodarone and Eliquis  Subjective:   Reports to be anxious this morning, breathing a little better   Objective:   BP (!) 64/52   Pulse 93   Temp (!) 97.4 F (36.3 C) (Oral)   Resp (!) 28   Ht '5\' 11"'$  (1.803 m)   Wt (!) 194.6 kg   SpO2 100%   BMI 59.84 kg/m   Intake/Output Summary (Last 24 hours) at 02/27/2021 0737 Last data filed at 02/27/2021 0700 Gross per 24 hour  Intake 4556.55 ml  Output 9029 ml  Net -4472.45 ml   Weight change: -2.6 kg  Physical Exam: Gen: Comfortably resting in bed, on oxygen via nasal cannula.  Morbidly obese man CVS: Pulse irregularly irregular tachycardia, S1 and S2 normal Resp: Diminished breath sounds over bases, no distinct rales or rhonchi Abd: Soft, obese, nontender Ext: 2-3+ anasarca.  Temporary right IJ hemodialysis catheter  Imaging: No results found.  Labs: BMET Recent Labs  Lab  02/24/21 1600 02/25/21 0419 02/25/21 1624 02/26/21 0429 02/26/21 1600 02/27/21 0500 02/27/21 0645  NA 120* 126* 124* 124* 128* 126* 127*  K 4.3 4.8 4.9 5.1 4.7 5.8* 5.7*  CL 87* 90* 91* 89* 92* 89* 93*  CO2 20* 22 20* 20* 24 25 20*  GLUCOSE 381* 152* 149* 153* 190* 114* 116*  BUN 19 21* '20 17 15 15 17  '$ CREATININE 2.31* 2.31* 2.40* 2.51* 2.32* 2.72* 2.79*  CALCIUM 8.7* 9.3 8.7* 9.4 8.7* 8.8* 9.0  PHOS 3.0 2.8 2.9 3.0 2.1* 3.9 3.6   CBC Recent Labs  Lab 02/24/21 0535 02/25/21 0419 02/26/21 0429 02/27/21 0500  WBC 14.9* 16.2* 16.3* 19.2*  HGB 11.4* 11.9* 11.8* 11.6*  HCT 36.6* 36.9* 35.8* 36.3*  MCV 92.9 92.0 90.4 92.4  PLT 245 233 255 277    Medications:     (feeding supplement) PROSource Plus  30 mL Oral TID BM   benzonatate  100 mg Oral BID   chlorhexidine  15 mL Mouth Rinse BID   Chlorhexidine Gluconate Cloth  6 each Topical Daily   glycerin (Pediatric)  2 suppository Rectal Once   insulin aspart  0-5 Units Subcutaneous QHS   insulin aspart  0-9 Units Subcutaneous TID WC   mouth rinse  15 mL Mouth Rinse q12n4p   midodrine  15 mg Oral TID WC   multivitamin  1 tablet Oral QHS   pantoprazole  40 mg Oral Daily   polyethylene glycol  17 g Oral BID   senna-docusate  2 tablet Oral BID  sodium chloride flush  10-40 mL Intracatheter Q12H   sodium chloride flush  3 mL Intravenous Q12H   Elmarie Shiley, MD 02/27/2021, 7:37 AM

## 2021-02-27 NOTE — Progress Notes (Addendum)
Patient ID: Phillip Young, male   DOB: 06/15/1986, 35 y.o.   MRN: [id number]     Advanced Heart Failure Rounding Note  PCP-Cardiologist: None   Subjective:    On DBA 7.5 + NE 6. Co-ox remains marginal at 50%.  Additional 9L removal off CRRT yesterday. Oliguric w/ only 50 cc in UOP. Wt down 5 lb. CVP 8   SCr 2.51>>2.72 K 5.8   Remains in AFL, 90s   Na 124>>126   WBC rising, 16>>19K but AF. Blood cultures NGTD.   Feeling better. No resting dyspnea. Had BM yesterday.    Echo: EF < 20%, severe LV dilation, RV not well visualized.   Objective:   Weight Range: (!) 194.6 kg Body mass index is 59.84 kg/m.   Vital Signs:   Temp:  [97 F (36.1 C)-98.3 F (36.8 C)] 97.4 F (36.3 C) (08/05 0610) Pulse Rate:  [80-101] 88 (08/05 0655) Resp:  [15-38] 25 (08/05 0655) BP: (87-115)/(34-101) 103/73 (08/05 0655) SpO2:  [93 %-100 %] 99 % (08/05 0655) Weight:  [194.6 kg] 194.6 kg (08/05 0610) Last BM Date: 02/26/21  Weight change: Filed Weights   02/25/21 0600 02/26/21 0500 02/27/21 0610  Weight: (!) 197.1 kg (!) 197.2 kg (!) 194.6 kg    Intake/Output:   Intake/Output Summary (Last 24 hours) at 02/27/2021 0709 Last data filed at 02/27/2021 0700 Gross per 24 hour  Intake 4556.55 ml  Output 9029 ml  Net -4472.45 ml      Physical Exam   CVP 8  General:  Well appearing, obese. No respiratory difficulty HEENT: normal Neck: supple. Thick neck, JVD not well visualized. Carotids 2+ bilat; no bruits. No lymphadenopathy or thyromegaly appreciated. Cor: PMI nondisplaced. Irregular rhythm and rate. No rubs, gallops or murmurs. Lungs: clear Abdomen: obese, soft, nontender, nondistended. No hepatosplenomegaly. No bruits or masses. Good bowel sounds. Extremities: no cyanosis, clubbing, rash, edema Neuro: alert & oriented x 3, cranial nerves grossly intact. moves all 4 extremities w/o difficulty. Affect pleasant.  Telemetry     flutter 90s (personally reviewed).   Labs     CBC Recent Labs    02/26/21 0429 02/27/21 0500  WBC 16.3* 19.2*  HGB 11.8* 11.6*  HCT 35.8* 36.3*  MCV 90.4 92.4  PLT 255 [id number]   Basic Metabolic Panel Recent Labs    02/26/21 0429 02/26/21 1600 02/27/21 0500  NA 124* 128* 126*  K 5.1 4.7 5.8*  CL 89* 92* 89*  CO2 20* 24 25  GLUCOSE 153* 190* 114*  BUN '17 15 15  '$ CREATININE 2.51* 2.32* 2.72*  CALCIUM 9.4 8.7* 8.8*  MG 2.7*  --  2.6*  PHOS 3.0 2.1* 3.9   Liver Function Tests Recent Labs    02/26/21 1600 02/27/21 0500  ALBUMIN 3.4* 3.3*    No results for input(s): LIPASE, AMYLASE in the last 72 hours. Cardiac Enzymes No results for input(s): CKTOTAL, CKMB, CKMBINDEX, TROPONINI in the last 72 hours.  BNP: BNP (last 3 results) Recent Labs    10/10/20 1336 11/07/20 0932 02/11/21 1500  BNP 126.9* 106.0* 194.3*    ProBNP (last 3 results) No results for input(s): PROBNP in the last 8760 hours.   D-Dimer No results for input(s): DDIMER in the last 72 hours.  Hemoglobin A1C No results for input(s): HGBA1C in the last 72 hours.  Fasting Lipid Panel No results for input(s): CHOL, HDL, LDLCALC, TRIG, CHOLHDL, LDLDIRECT in the last 72 hours. Thyroid Function Tests No results for input(s): TSH, T4TOTAL, T3FREE, THYROIDAB in  the last 72 hours.  Invalid input(s): FREET3   Other results:   Imaging    No results found.   Medications:     Scheduled Medications:  (feeding supplement) PROSource Plus  30 mL Oral TID BM   benzonatate  100 mg Oral BID   chlorhexidine  15 mL Mouth Rinse BID   Chlorhexidine Gluconate Cloth  6 each Topical Daily   glycerin (Pediatric)  2 suppository Rectal Once   insulin aspart  0-5 Units Subcutaneous QHS   insulin aspart  0-9 Units Subcutaneous TID WC   mouth rinse  15 mL Mouth Rinse q12n4p   midodrine  15 mg Oral TID WC   multivitamin  1 tablet Oral QHS   pantoprazole  40 mg Oral Daily   polyethylene glycol  17 g Oral BID   senna-docusate  2 tablet Oral BID   sodium  chloride flush  10-40 mL Intracatheter Q12H   sodium chloride flush  3 mL Intravenous Q12H    Infusions:   prismasol BGK 4/2.5 400 mL/hr at 02/26/21 2100    prismasol BGK 4/2.5 200 mL/hr at 02/26/21 0756   sodium chloride 10 mL/hr at 02/27/21 0700   amiodarone 30 mg/hr (02/27/21 0700)   DOBUTamine 7.5 mcg/kg/min (02/27/21 0700)   ferric gluconate (FERRLECIT) IVPB Stopped (02/26/21 1023)   heparin 2,900 Units/hr (02/27/21 0700)   norepinephrine (LEVOPHED) Adult infusion 6 mcg/min (02/27/21 0700)   prismasol BGK 4/2.5 3,000 mL/hr at 02/27/21 0620    PRN Medications: sodium chloride, acetaminophen **OR** acetaminophen, alum & mag hydroxide-simeth, diphenhydrAMINE, guaiFENesin-dextromethorphan, heparin, heparin, menthol-cetylpyridinium, ondansetron (ZOFRAN) IV, oxyCODONE, sodium chloride, sodium chloride flush, sodium chloride flush  Assessment/Plan   1. Acute on Chronic Biventricular Heart Failure:  Known cardiomyopathy since 2019, previously followed by [facility].  Echo 02/27/2020 with EF < 20%, moderate LV dilation, severely decreased RV function with severe RV dilation, severe biatrial enlargement, mild MR. No history of ETOH/drugs.  No FH of cardiomyopathy (mother with [place]).  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  He is too large for cardiac MRI. RHC/LHC on 03/04/20 with no CAD (nonischemic cardiomyopathy), elevated left and right heart filling pressures, and low CI at 2.1. 05/2020 Echo showed EF 20-25%. Evaluated by EP but not a candidate for ICD given BMI.  Echo this admission with EF < 20%, RV poorly visualized. He now has CHF exacerbation likely in setting of AFL with RVR.  He was hypotensive with poor UOP, concern for cardiogenic shock.  Dobutamine therefore started at 2.5 and increased to 7.5, NE added at 6 currently.  With progressive renal failure, CVVH started.  This morning, co-ox 50%. Continues on CVVH for fluid removal. Overall,  down 60 lb. CVP 8. Remains in ALF - Plan attempt at [facility] today  - Continue dobutamine 7.5 and wean NE today as tolerated. C/w midodrine 15 mg tid - Unfortunately, suspect he will be HD dependent - With AKI/hypotension, stopped Entresto, dapagliflozin, spironolactone, digoxin.  - With marked obesity, he is not a candidate for advanced therapies.  2. Aflutter w/ RVR: Noted to be in Afib prior admit, 3/22, in setting of CAP and a/c CHF but converted to NSR on amiodarone. Discharged home w/ eliquis. In 2:1 AFL this admit in setting of a/c CHF w/ marked volume overload, + recent gastroenteritis w/ diarrhea. Has OSA but not using CPAP. TSH WNL. Admits to intermittent compliance w/ Eliquis. Has missed several doses in the last month.  - c/w amio drip 60  mg per hour.  - Continue Eliquis 5 mg bid.  - Plan attempt at [place] today  3. Acute Hypoxic Respiratory Failure: Suspect underlying OHS/OSA exacerbated by CHF this admission.  - Continue oxygen + CPAP.  4. Type 2 DM: Farxiga held with AKI.  5. OSA: Has not been on Bipap at home.  6. Super Morbid Obesity: Body mass index is 59.84 kg/m. 7. Tooth Abscess: Completed ceftriaxone. 8. UTI: Suspect by UA, sent urine culture.  ?Urinary obstruction related to this => no hydronephrosis on renal US.  - Ceftriaxone IV course completed.  9. Elevated Hs Trop: Suspect demand ischemia in setting of CHF + rapid AFL. Cath 8/21 showed no CAD.  10. AKI: Creatinine peaked to 3.32 though BUN not markedly high.  Concern for cardiorenal syndrome.  Renal US with no hydronephrosis. Remains on CVVHD, pulling 150 cc/hr.  Not as much UOP over the last day. Concerned he will end up HD-dependent.  - Nephrology appreciated.  11. Anemia: Hgb 11.6 today.  - Received IV Fe.  12. Cough: CXR with CHF.  - Improved.  13. Hyponatremia: Na 126 - on CVVHD, management per nephrology  14. ID: WBC elevated but stable at 19K. AF - BCx NGTD  15. Hyperkalemia - K 5.8 in setting of renal  failure - c/w CVVHD   Length of Stay: [address] PA-C  02/27/2021, 7:09 AM  Advanced Heart Failure Team Pager [phone] (M-F; 7a - 5p)  Please contact [facility] for night-coverage after hours (5p -7a ) and weekends on [email or web address]   Patient seen with NP, agree with the above note.   I/Os net negative 4472 via CVVH.  CVP 8.  UOP 50. Co-ox 50% this morning on dobutamine 7.5 + NE 6.  Remains in atrial flutter rate better in 90s on amiodarone 30 mg/hr.   WBCs 16 => 19, blood cultures negative.    General: NAD, obese.  Neck: Thick. No JVD, no thyromegaly or thyroid nodule.  Lungs: Clear to auscultation bilaterally with normal respiratory effort. CV: Nondisplaced PMI.  Heart regular S1/S2, no S3/S4, no murmur.  Trace ankle edema.  Abdomen: Soft, nontender, no hepatosplenomegaly, no distention.  Skin: Intact without lesions or rashes.  Neurologic: Alert and oriented x 3.  Psych: Normal affect. Extremities: No clubbing or cyanosis.  HEENT: Normal.   Volume status looks optimized with CVP 8.  Keep CVVH running even today.  Continue midodrine 15 mg tid, dobutamine 7.5, and NE 6 for now.  I am concerned that his renal function is not going to recover.    Aim for DCCV without TEE, understanding risk is higher for CVA but TEE may be too dangerous for him in terms of aspiration risk and intubation (would be very hard to get him extubated). Planning for this morning.    He is having ongoing code discussions with palliative care.   CRITICAL CARE Performed by: [name]  Total critical care time: 40 minutes  Critical care time was exclusive of separately billable procedures and treating other patients.  Critical care was necessary to treat or prevent imminent or life-threatening deterioration.  Critical care was time spent personally by me on the following activities: development of treatment plan with patient and/or surrogate as well as nursing, discussions with  consultants, evaluation of patient's response to treatment, examination of patient, obtaining history from patient or surrogate, ordering and performing treatments and interventions, ordering and review of laboratory studies, ordering and review of radiographic studies, pulse oximetry and re-evaluation  of patient's condition.  [name] 02/27/2021 7:41 AM

## 2021-02-27 NOTE — Procedures (Signed)
Electrical Cardioversion Procedure Note Phillip Young [id number] [birth year]  Procedure: Electrical Cardioversion Indications:  Atrial Flutter  Procedure Details Consent: Risks of procedure as well as the alternatives and risks of each were explained to the (patient/caregiver).  Consent for procedure obtained. Time Out: Verified patient identification, verified procedure, site/side was marked, verified correct patient position, special equipment/implants available, medications/allergies/relevent history reviewed, required imaging and test results available.  Performed  Patient placed on cardiac monitor, pulse oximetry, supplemental oxygen as necessary.  Sedation given:  Per CCM Pacer pads placed anterior and posterior chest.  Cardioverted 1 time(s).  Cardioverted at [place].  Evaluation Findings: Post procedure EKG shows: NSR Complications: None Patient did tolerate procedure well.   [name] 02/27/2021, 8:04 AM

## 2021-02-27 NOTE — Progress Notes (Signed)
Daily Progress Note   Patient Name: Phillip Young       Date: 02/27/2021 DOB: 08/20/1985  Age: 35 y.o. MRN#: [id number] Attending Physician: Larey Dresser, MD Primary Care Physician: [name], MD Admit Date: 02/11/2021  Reason for Consultation/Follow-up: Establishing goals of care  Subjective: Phillip Young is out of bed today- sitting up in chair. He ambulated with his RN and participated in physical therapy. Has been adherent to fluid restriction. DCCV was successful in returning him to NSR. Pressors are weaning down slowly.  He had a BM.  He reports that surprisingly the low dose oxycodone was helpful to him yesterday.  We discussed his pleasure with his progress. Also discussed the barriers that still are in front of him. Pressor requirements, renal injury. He notes that he feels he is putting out more urine today and his hopeful his kidney function improves now that his heart is back in NSR. However, he would be agreeable to ongoing permanent HD if this was needed.  He is waiting for his Mom to visit to look over advanced directives and consider completing them.   Review of Systems  Constitutional:  Positive for malaise/fatigue.  Respiratory:  Negative for shortness of breath.   Cardiovascular:  Negative for chest pain and orthopnea.   Length of Stay: 16  Current Medications: Scheduled Meds:   (feeding supplement) PROSource Plus  30 mL Oral TID BM   benzonatate  100 mg Oral BID   chlorhexidine  15 mL Mouth Rinse BID   Chlorhexidine Gluconate Cloth  6 each Topical Daily   etomidate       fentaNYL (SUBLIMAZE) injection  100-200 mcg Intravenous Once   glycerin (Pediatric)  2 suppository Rectal Once   insulin aspart  0-5 Units Subcutaneous QHS   insulin aspart  0-9 Units  Subcutaneous TID WC   ketamine HCl       mouth rinse  15 mL Mouth Rinse q12n4p   midazolam       midodrine  15 mg Oral TID WC   multivitamin  1 tablet Oral QHS   pantoprazole  40 mg Oral Daily   polyethylene glycol  17 g Oral QHS   rocuronium bromide       senna-docusate  2 tablet Oral BID   sodium chloride flush  10-40 mL Intracatheter Q12H  sodium chloride flush  3 mL Intravenous Q12H    Continuous Infusions:  sodium chloride 10 mL/hr at 02/27/21 1400   amiodarone 30 mg/hr (02/27/21 1400)   dexmedetomidine (PRECEDEX) IV infusion Stopped (02/27/21 0802)   DOBUTamine 7.5 mcg/kg/min (02/27/21 1400)   heparin 2,900 Units/hr (02/27/21 1400)   norepinephrine (LEVOPHED) Adult infusion 3 mcg/min (02/27/21 1400)   prismasol BGK 2/2.5 dialysis solution 2,000 mL/hr at 02/27/21 1303   prismasol BGK 2/2.5 replacement solution 400 mL/hr at 02/27/21 0945   prismasol BGK 2/2.5 replacement solution 400 mL/hr at 02/27/21 0945    PRN Meds: sodium chloride, acetaminophen **OR** acetaminophen, alum & mag hydroxide-simeth, diphenhydrAMINE, guaiFENesin-dextromethorphan, heparin, heparin, menthol-cetylpyridinium, ondansetron (ZOFRAN) IV, oxyCODONE, sodium chloride, sodium chloride flush, sodium chloride flush  Physical Exam Vitals and nursing note reviewed.  Constitutional:      General: He is not in acute distress.    Appearance: He is not ill-appearing.  Cardiovascular:     Rate and Rhythm: Normal rate and regular rhythm.  Pulmonary:     Effort: Pulmonary effort is normal.  Neurological:     General: No focal deficit present.     Mental Status: He is alert and oriented to person, place, and time. Mental status is at baseline.            Vital Signs: BP (!) 95/52   Pulse 95   Temp 97.8 F (36.6 C) (Oral)   Resp (!) 30   Ht '5\' 11"'$  (1.803 m)   Wt (!) 194.6 kg   SpO2 96%   BMI 59.84 kg/m  SpO2: SpO2: 96 % O2 Device: O2 Device: Bi-PAP O2 Flow Rate: O2 Flow Rate (L/min): 5  L/min  Intake/output summary:  Intake/Output Summary (Last 24 hours) at 02/27/2021 1514 Last data filed at 02/27/2021 1400 Gross per 24 hour  Intake 4115.95 ml  Output 8060 ml  Net -3944.05 ml   LBM: Last BM Date: 02/27/21 Baseline Weight: Weight: (!) 215.5 kg Most recent weight: Weight: (!) 194.6 kg       Palliative Assessment/Data: PPS: 70%      Patient Active Problem List   Diagnosis Date Noted   Cardiogenic shock ([place])    Atrial flutter with rapid ventricular response ([place]) 02/11/2021   Elevated troponin 02/11/2021   Respiratory failure with hypoxia ([place]) 02/11/2021   CAP (community acquired pneumonia) 10/08/2020   Acute on chronic systolic congestive heart failure ([name])    Hypokalemia    Biventricular heart failure ([name])    Chronic cough    Acute on chronic systolic (congestive) heart failure ([place]) 02/26/2020   Morbid obesity with body mass index (BMI) of 60.0 to 69.9 in adult ([place]) 02/26/2020   Essential hypertension 02/26/2020   GERD without esophagitis 02/26/2020   Diuretic-induced hypokalemia 02/26/2020   OSA (obstructive sleep apnea) 02/26/2020   Hidradenitis suppurativa 02/26/2020   Prediabetes 02/26/2020    Palliative Care Assessment & Plan   Patient Profile: 35 y.o. male  with past medical history of diabetes, OSA, CHF, morbid obesity admitted on 02/11/2021 with shortness of breath and palpitations. Workup reveals CHF exacerbation and acute renal failure in the setting of A-fib with RVR. ECHO showed EF <20%. He has been hypotensive - is on dobutamine and norepinephrine. Currently on CRRT. Scheduled for DCCV tomorrow without TE or intubation due to his high risk to not be able to extubate. He was refusing fluid restrictions and midline (but currently has accepted)- and made comment to cardiology NP that he was "tired and not  sure he wanted to continue doing this". Palliative medicine consulted for goals of care.   Assessment/Recommendations/Plan  GOC/Advanced  care planning- remain the same- full code, full scope- patient to discuss advanced directives with his Mom Heart failure- symptoms are improved today- he was able to ambulate and is sitting up out of bed, SOB is improved- recommend outpatient Palliative Renal injury- on CRRT- he would be agreeable to long term HD if needed Constipation- resolved- Phillip Young change miralax to QHS only- continue senna-S  DM- he needs outpatient primary for control, needs nutrition and diabetes education- recommend outpatient referral to a diabetes educator PMT Phillip Young chart check and see him again if he decompensates- please call if further assistance is needed   Goals of Care and Additional Recommendations: Limitations on Scope of Treatment: Full Scope Treatment  Code Status: Full code  Prognosis:  Unable to determine  Discharge Planning: To Be Determined  Care plan was discussed with patient and Misha, RN.   Thank you for allowing the Palliative Medicine Team to assist in the care of this patient.   Total time: 38 mins Greater than 50%  of this time was spent counseling and coordinating care related to the above assessment and plan.  Mariana Kaufman, AGNP-C Palliative Medicine   Please contact Palliative Medicine Team phone at [phone] for questions and concerns.

## 2021-02-27 NOTE — Progress Notes (Signed)
NAME:  Phillip Young, MRN:  [id number], DOB:  1986/07/07, LOS: 22 ADMISSION DATE:  02/11/2021, CONSULTATION DATE:  02/15/21 REFERRING MD:  Harvin Hazel HF  -- [name] , CHIEF COMPLAINT:  decompensated heart failure, renal failure    History of Present Illness:  35 yo M PMH class V obesity, biventricular heart failure, HTN, OSA presented to ED 7/20 with CC SOB, palpitations x 1 week. Associated dry cough, intermittent hypoxia, edema. Admitted to [facility] for management of acute on chronic heart failure, rapid aflutter. Got '80mg'$  IV lasix, amio. 7/21 Adv HF consulted for further assistance. 7/24 patient in the CVICU on a lasix infusion as well as dobutamine without robust response to diuresis (notably patient is refusing foley catheter placement). He was started on norepi 7/24 with persistent soft Bps amd worsening AKI, volume overload.   Critical care consulted in this setting  Pertinent  Medical History  Biventricular heart failure OSA Class V obesity GERD HTN DM2  Significant Hospital Events: Including procedures, antibiotic start and stop dates in addition to other pertinent events   7/20 admitted to [facility] for Afl RVR, acute on chronic BiV HF, hypoxia. Amio, push dose lasix  7/21 adv HF consult, started dobutamine, lasix gtt  7/22 ECHO with LVEF <20% (worse than prior). Hard to assess RV with technically difficult study  7/24 adding NE. CCM consult. Worse renal function, on lasix gtt but teetering need for CRRT  7/25 CRRT pulling 150/hr  Interim History / Subjective:   Remains critically ill, on pressors in the ICU   Objective   Blood pressure (!) 64/52, pulse 93, temperature (!) 97.4 F (36.3 C), temperature source Oral, resp. rate (!) 28, height '5\' 11"'$  (1.803 m), weight (!) 194.6 kg, SpO2 100 %. CVP:  [15 mmHg-17 mmHg] 17 mmHg      Intake/Output Summary (Last 24 hours) at 02/27/2021 0751 Last data filed at 02/27/2021 0700 Gross per 24 hour  Intake 4319.55 ml  Output 8979 ml  Net -4659.45 ml    Filed Weights   02/25/21 0600 02/26/21 0500 02/27/21 0610  Weight: (!) 197.1 kg (!) 197.2 kg (!) 194.6 kg    Examination: General: Obese male, resting in bed  Neuro:  AAOx3  HEENT:  NCAT Cardiovascular:  Irregularly irregular  Lungs:  CTAB  Abdomen:  Soft, NT ND  Musculoskeletal:  trace edema  Skin:  MMM   Resolved Hospital Problem list     Assessment & Plan:   Acute decompensated heart failure with pulmonary edema- about 60 lbs above dry weight on presentation.  Cardiogenic shock P:  Remains on pressors and inotropes Weaning per HF service  MAP goal 65 mmHg   Atrial flutter w RVR P: Remains on amiodarone  Eliquis Successful cardioversion today   Acute hypoxemic respiratory failure: on nocturnal oxygen at home. Sat 88% on room air in ED.  Pulmonary edema.  OSA/OHS with poor home BiPAP compliance due to financial limitations.  -CPAP QHS   Cardiorenal syndrome: creatinine trending down. 7L UOP last 24 hours.  - continue CVVHD  Best Practice (right click and "Reselect all SmartList Selections" daily)   Diet/type: diabetic DVT prophylaxis: DOAC GI prophylaxis: N/A Lines: yes and it is still needed  Foley:  N/A, refuses Code Status:  full code Last date of multidisciplinary goals of care discussion [pending] Dispo: CVICU   This patient is critically ill with multiple organ system failure; which, requires frequent high complexity decision making, assessment, support, evaluation, and titration of therapies. This was completed through  the application of advanced monitoring technologies and extensive interpretation of multiple databases. During this encounter critical care time was devoted to patient care services described in this note for 32 minutes.  Garner Nash, DO [facility] 02/27/2021 7:51 AM

## 2021-02-28 DIAGNOSIS — I5023 Acute on chronic systolic (congestive) heart failure: Secondary | ICD-10-CM | POA: Diagnosis not present

## 2021-02-28 LAB — RENAL FUNCTION PANEL
Albumin: 3 g/dL — ABNORMAL LOW (ref 3.5–5.0)
Albumin: 3 g/dL — ABNORMAL LOW (ref 3.5–5.0)
Anion gap: 10 (ref 5–15)
Anion gap: 10 (ref 5–15)
BUN: 21 mg/dL — ABNORMAL HIGH (ref 6–20)
BUN: 23 mg/dL — ABNORMAL HIGH (ref 6–20)
CO2: 24 mmol/L (ref 22–32)
CO2: 24 mmol/L (ref 22–32)
Calcium: 8.4 mg/dL — ABNORMAL LOW (ref 8.9–10.3)
Calcium: 8.7 mg/dL — ABNORMAL LOW (ref 8.9–10.3)
Chloride: 88 mmol/L — ABNORMAL LOW (ref 98–111)
Chloride: 90 mmol/L — ABNORMAL LOW (ref 98–111)
Creatinine, Ser: 3.16 mg/dL — ABNORMAL HIGH (ref 0.61–1.24)
Creatinine, Ser: 3.12 mg/dL — ABNORMAL HIGH (ref 0.61–1.24)
GFR, Estimated: 25 mL/min — ABNORMAL LOW (ref >60)
GFR, Estimated: 26 mL/min — ABNORMAL LOW (ref >60)
Glucose, Bld: 136 mg/dL — ABNORMAL HIGH (ref 70–99)
Glucose, Bld: 127 mg/dL — ABNORMAL HIGH (ref 70–99)
Phosphorus: 2.2 mg/dL — ABNORMAL LOW (ref 2.5–4.6)
Phosphorus: 2.3 mg/dL — ABNORMAL LOW (ref 2.5–4.6)
Potassium: 4.3 mmol/L (ref 3.5–5.1)
Potassium: 4 mmol/L (ref 3.5–5.1)
Sodium: 122 mmol/L — ABNORMAL LOW (ref 135–145)
Sodium: 124 mmol/L — ABNORMAL LOW (ref 135–145)

## 2021-02-28 LAB — GLUCOSE, CAPILLARY
Glucose-Capillary: 168 mg/dL — ABNORMAL HIGH (ref 70–99)
Glucose-Capillary: 115 mg/dL — ABNORMAL HIGH (ref 70–99)
Glucose-Capillary: 217 mg/dL — ABNORMAL HIGH (ref 70–99)
Glucose-Capillary: 165 mg/dL — ABNORMAL HIGH (ref 70–99)

## 2021-02-28 LAB — CBC
HCT: 33.5 % — ABNORMAL LOW (ref 39.0–52.0)
Hemoglobin: 10.8 g/dL — ABNORMAL LOW (ref 13.0–17.0)
MCH: 29.8 pg (ref 26.0–34.0)
MCHC: 32.2 g/dL (ref 30.0–36.0)
MCV: 92.5 fL (ref 80.0–100.0)
Platelets: 260 10*3/uL (ref 150–400)
RBC: 3.62 MIL/uL — ABNORMAL LOW (ref 4.22–5.81)
RDW: 15.3 % (ref 11.5–15.5)
WBC: 19.4 10*3/uL — ABNORMAL HIGH (ref 4.0–10.5)
nRBC: 0.6 % — ABNORMAL HIGH (ref 0.0–0.2)

## 2021-02-28 LAB — COOXEMETRY PANEL
Carboxyhemoglobin: 1.4 % (ref 0.5–1.5)
Methemoglobin: 0.8 % (ref 0.0–1.5)
O2 Saturation: 54.3 %
Total hemoglobin: 11.1 g/dL — ABNORMAL LOW (ref 12.0–16.0)

## 2021-02-28 LAB — MAGNESIUM: Magnesium: 2.5 mg/dL — ABNORMAL HIGH (ref 1.7–2.4)

## 2021-02-28 LAB — HEPARIN LEVEL (UNFRACTIONATED): Heparin Unfractionated: 0.29 IU/mL — ABNORMAL LOW (ref 0.30–0.70)

## 2021-02-28 NOTE — Progress Notes (Signed)
ANTICOAGULATION CONSULT NOTE- Follow-Up Note  Pharmacy Consult for Heparin  Indication: atrial fibrillation  Patient Measurements: Height: '5\' 11"'$  (180.3 cm) Weight: (!) 196 kg (432 lb 1.6 oz) IBW/kg (Calculated) : 75.3 HEPARIN DW (KG): 130.5   Vital Signs: Temp: 98.1 F (36.7 C) (08/06 0800) Temp Source: Tympanic (08/06 0800) BP: 160/98 (08/06 1000) Pulse Rate: 88 (08/06 1000)  Labs: Recent Labs    02/26/21 0429 02/26/21 1600 02/27/21 0500 02/27/21 0645 02/27/21 1744 02/28/21 0306  HGB 11.8*  --  11.6* 11.9* 11.4* 10.8*  HCT 35.8*  --  36.3* 36.6* 35.5* 33.5*  PLT 255  --  277 258 270 260  APTT 84*  --   --   --   --   --   HEPARINUNFRC 0.43  --  0.43 0.38  --  0.29*  CREATININE 2.51*   < > 2.72* 2.79* 3.09* 3.16*   < > = values in this interval not displayed.     Estimated Creatinine Clearance: 56.5 mL/min (A) (by C-G formula based on SCr of 3.16 mg/dL (H)).    Assessment 35 yo male on apixaban for afib. He has acute HF and on CRRT and plans to start heparin. Last dose of apixaban given 7/29 ~ 10pm.   Heparin level at low end of therapeutic at 0.3 on heparin drip 2900uts/hr  CBC stable.  Goal of Therapy:  Heparin level 0.3-0.7 units/ml Monitor platelets by anticoagulation protocol: Yes   Plan:   Increase slightly  heparin 2950 units/h -Daily heparin level and CBC   Bonnita Nasuti Pharm.D. CPP, BCPS Clinical Pharmacist [phone] 02/28/2021 11:16 AM   Please check AMION for all [facility] numbers 02/28/2021

## 2021-02-28 NOTE — Progress Notes (Signed)
Patient ID: Phillip Young, male   DOB: [birth year], 35 y.o.   MRN: [id number]     Advanced Heart Failure Rounding Note  PCP-Cardiologist: None   Subjective:    Underwent DCCV yesterday with successful conversion to NSR. No TEE due to concern for aspiration and intubation  Remains in NSR in 80s.   Brief episode of unresponsiveness yesterday afternoon.  Reportedly, acutely vomited and became unresponsive.  A code blue was called, but he became responsive within seconds.    Remains on DBA 7.5.  NE off.  Co-ox remains marginal at 54.3%. Weight up to 432 from 429 but overall, down about 60 pounds.  CVP is 22.    CRRT continues with 7.2L removed overnight.  Net I/O is -3L.  Creatinine increased slightly to 3.16 from 3.    Na down to 122 from 124.    WBC continues to rise (19 to 21), but afebrile. Blood cultures NGTD.   Echo: EF < 20%, severe LV dilation, RV not well visualized.   This morning, he is sitting up in a chair and vomited earlier.  He appears mildly short of breath.  He denies pain, but endorses frustration with his fluid restriction as well as concern about his lack of renal recovery.     Objective:   Weight Range: (!) 196 kg Body mass index is 60.27 kg/m.   Vital Signs:   Temp:  [97.8 F (36.6 C)-98.1 F (36.7 C)] 98.1 F (36.7 C) (08/06 0800) Pulse Rate:  [83-98] 83 (08/06 0900) Resp:  [17-38] 37 (08/06 0900) BP: (85-162)/(40-104) 156/49 (08/06 0900) SpO2:  [90 %-100 %] 96 % (08/06 0900) Weight:  [196 kg] 196 kg (08/06 0500) Last BM Date: 02/27/21  Weight change: Filed Weights   02/26/21 0500 02/27/21 0610 02/28/21 0500  Weight: (!) 197.2 kg (!) 194.6 kg (!) 196 kg    Intake/Output:   Intake/Output Summary (Last 24 hours) at 02/28/2021 0949 Last data filed at 02/28/2021 0900 Gross per 24 hour  Intake 4379.77 ml  Output 7444 ml  Net -3064.23 ml       Physical Exam   CVP 22 General:  Well appearing, obese. Mild dyspnea noted  HEENT: normal Neck:  supple. Thick neck, JVD not well visualized. Carotids 2+ bilat; no bruits. No lymphadenopathy or thyromegaly appreciated. Cor: PMI nondisplaced. Regular rate and rhythm. No rubs, gallops or murmurs. Lungs: clear Abdomen: obese, soft, nontender, nondistended. No hepatosplenomegaly. No bruits or masses. Good bowel sounds. Extremities: no cyanosis, clubbing, rash. 1+ edema to BLE  Neuro: alert & oriented x 3, cranial nerves grossly intact. moves all 4 extremities w/o difficulty.  Telemetry    NSR in 80s (personally reviewed)  Labs    CBC Recent Labs    02/27/21 1744 02/28/21 0306  WBC 21.0* 19.4*  HGB 11.4* 10.8*  HCT 35.5* 33.5*  MCV 92.2 92.5  PLT 270 [id number]    Basic Metabolic Panel Recent Labs    02/27/21 1744 02/28/21 0306  NA 124* 122*  K 4.9 4.3  CL 88* 88*  CO2 24 24  GLUCOSE 108* 136*  BUN 19 21*  CREATININE 3.09* 3.16*  CALCIUM 8.7* 8.4*  MG 2.5* 2.5*  PHOS 2.8 2.2*    Liver Function Tests Recent Labs    02/27/21 1744 02/28/21 0306  ALBUMIN 3.3* 3.0*     No results for input(s): LIPASE, AMYLASE in the last 72 hours. Cardiac Enzymes No results for input(s): CKTOTAL, CKMB, CKMBINDEX, TROPONINI in the last 72 hours.  BNP: BNP (last 3 results) Recent Labs    10/10/20 1336 11/07/20 0932 02/11/21 1500  BNP 126.9* 106.0* 194.3*     ProBNP (last 3 results) No results for input(s): PROBNP in the last 8760 hours.   D-Dimer No results for input(s): DDIMER in the last 72 hours.  Hemoglobin A1C No results for input(s): HGBA1C in the last 72 hours.  Fasting Lipid Panel No results for input(s): CHOL, HDL, LDLCALC, TRIG, CHOLHDL, LDLDIRECT in the last 72 hours. Thyroid Function Tests No results for input(s): TSH, T4TOTAL, T3FREE, THYROIDAB in the last 72 hours.  Invalid input(s): FREET3   Other results:   Imaging    No results found.   Medications:     Scheduled Medications:  (feeding supplement) PROSource Plus  30 mL Oral TID BM    benzonatate  100 mg Oral BID   chlorhexidine  15 mL Mouth Rinse BID   Chlorhexidine Gluconate Cloth  6 each Topical Daily   fentaNYL (SUBLIMAZE) injection  100-200 mcg Intravenous Once   glycerin (Pediatric)  2 suppository Rectal Once   insulin aspart  0-5 Units Subcutaneous QHS   insulin aspart  0-9 Units Subcutaneous TID WC   mouth rinse  15 mL Mouth Rinse q12n4p   midodrine  15 mg Oral TID WC   multivitamin  1 tablet Oral QHS   pantoprazole  40 mg Oral Daily   polyethylene glycol  17 g Oral QHS   senna-docusate  2 tablet Oral BID   sodium chloride flush  10-40 mL Intracatheter Q12H   sodium chloride flush  3 mL Intravenous Q12H    Infusions:  sodium chloride Stopped (02/27/21 1651)   amiodarone 30 mg/hr (02/28/21 0900)   dexmedetomidine (PRECEDEX) IV infusion Stopped (02/27/21 0802)   DOBUTamine 7.5 mcg/kg/min (02/28/21 0900)   heparin 2,900 Units/hr (02/28/21 0900)   norepinephrine (LEVOPHED) Adult infusion Stopped (02/27/21 1701)   prismasol BGK 2/2.5 dialysis solution 3,000 mL/hr at 02/28/21 0855   prismasol BGK 2/2.5 replacement solution 400 mL/hr at 02/28/21 0855   prismasol BGK 2/2.5 replacement solution 400 mL/hr at 02/28/21 0854    PRN Medications: sodium chloride, acetaminophen **OR** acetaminophen, alum & mag hydroxide-simeth, diphenhydrAMINE, guaiFENesin-dextromethorphan, heparin, heparin, menthol-cetylpyridinium, ondansetron (ZOFRAN) IV, oxyCODONE, sodium chloride, sodium chloride flush, sodium chloride flush  Assessment/Plan   1. Acute on Chronic Biventricular Heart Failure:  Known cardiomyopathy since 2019, previously followed by [facility].  Echo 02/27/2020 with EF < 20%, moderate LV dilation, severely decreased RV function with severe RV dilation, severe biatrial enlargement, mild MR. No history of ETOH/drugs.  No FH of cardiomyopathy (mother with [place]).  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  He is too large for  cardiac MRI. RHC/LHC on 03/04/20 with no CAD (nonischemic cardiomyopathy), elevated left and right heart filling pressures, and low CI at 2.1. 05/2020 Echo showed EF 20-25%. Evaluated by EP but not a candidate for ICD given BMI.  Echo this admission with EF < 20%, RV poorly visualized. He now has CHF exacerbation likely in setting of AFL with RVR.  He was hypotensive with poor UOP, concern for cardiogenic shock.  Dobutamine therefore started at 2.5 and increased to 7.5.  NE weaned off.  With progressive renal failure, CVVH started.  This morning, co-ox 53.4%. Continues on CVVH for fluid removal. Overall, down 60 lb. CVP still elevated at 22.  - Now in NSR after successful DCCV on 02/27/2021. No TEE due to concern for high aspiration risk and intubation   -  Continue dobutamine at 7.5.  NE off and he is now somewhat hypertensive.  Will attempt to remove more volume via CRRT (goal is -250 mL/hr) and if his SBP remains >140, will start hydralazine  - Unfortunately, suspect he will be HD dependent - With AKI/hypotension, stopped Entresto, dapagliflozin, spironolactone, digoxin.  - With marked obesity, he is not a candidate for advanced therapies.  2. Aflutter w/ RVR:  Noted to be in Afib during a prior admission in 3/22 in the setting of CAP and a/c CHF, but converted to NSR on amiodarone. Discharged home w/ eliquis. Was in 2:1 AFL this admit in setting of a/c CHF w/ marked volume overload, + recent gastroenteritis w/ diarrhea. Has OSA but not using CPAP. TSH WNL. Admits to intermittent compliance w/ Eliquis. Has missed several doses in the last month. Now in NSR s/p DCCV on 8/5. - c/w amio drip at 30 mg per hour.  - Continue Eliquis 5 mg bid.  3. Acute Hypoxic Respiratory Failure: Suspect underlying OHS/OSA exacerbated by CHF this admission.  - Continue oxygen + CPAP.  4. Type 2 DM: Farxiga held with AKI.  5. OSA: Has not been on Bipap at home.  6. Super Morbid Obesity: Body mass index is 60.27 kg/m. 7.  Tooth Abscess: Completed ceftriaxone. 8. UTI: Suspect by UA, sent urine culture.  ?Urinary obstruction related to this => no hydronephrosis on renal US.  - Ceftriaxone IV course completed.  9. Elevated Hs Trop: Suspect demand ischemia in setting of CHF + rapid AFL. Cath 8/21 showed no CAD.  10. AKI: Creatinine peaked to 3.32 though BUN not markedly high.  Concern for cardiorenal syndrome.  Renal US with no hydronephrosis. Remains on CVVHD and aiming for -250 mL/hr.  Concerned he will end up HD-dependent.  - Nephrology appreciated.  11. Anemia: Hgb 10.8 today.  - Received IV Fe.  12. Cough: CXR with CHF.  - Improved.  13. Hyponatremia: Na 122 - on CVVHD, management per nephrology  14. ID: WBC elevated but stable at 19K. AF - BCx NGTD  15. Hyperkalemia -- now resolved  - K previously 5.8 in setting of renal failure - c/w CVVHD   Length of Stay: [place], NP  02/28/2021, 9:49 AM  Advanced Heart Failure Team Pager [phone] (M-F; 7a - 5p)   Agree with above.   Remains tenuous. Had brief LOC yesterday after coughing. Code called.  Resuscitated quickly.   Underwent DC-CV on 8/5  Remains on DBA 7.5 and CVVHD. Pulling -200/hr. Feels ok. Remains SOB and bloated. Noncompliant with fluid restriction.   General:  Sitting in chair  HEENT: normal Neck: supple. +IJ HD cath Carotids 2+ bilat; no bruits. No lymphadenopathy or thryomegaly appreciated. Cor: PMI nondisplaced. Regular rate & rhythm. No rubs, gallops or murmurs. Lungs: clear Abdomen: obese soft, nontender, + distended. No hepatosplenomegaly. No bruits or masses. Good bowel sounds. Extremities: no cyanosis, clubbing, rash, 3+ edema Neuro: alert & orientedx3, cranial nerves grossly intact. moves all 4 extremities w/o difficulty. Affect pleasant  Remains markedly volume overloaded despite CVVHD. Co-ox marginal at 54% on DBA 7.5.   Will increase CVVHD to -250. Discussed need to reduce his fluid intake.   Continue amio and  eliquis.   Long-term plan remains unclear given size and need for intorope support likely not good candidate for HD.   CRITICAL CARE Performed by: Glori Bickers  Total critical care time: 35 minutes  Critical care time was exclusive of separately billable procedures and  treating other patients.  Critical care was necessary to treat or prevent imminent or life-threatening deterioration.  Critical care was time spent personally by me (independent of midlevel providers or residents) on the following activities: development of treatment plan with patient and/or surrogate as well as nursing, discussions with consultants, evaluation of patient's response to treatment, examination of patient, obtaining history from patient or surrogate, ordering and performing treatments and interventions, ordering and review of laboratory studies, ordering and review of radiographic studies, pulse oximetry and re-evaluation of patient's condition.   Glori Bickers, MD  11:02 AM

## 2021-02-28 NOTE — Progress Notes (Signed)
RN stated he would assist patient with CPAP. RT will monitor as needed.

## 2021-02-28 NOTE — Progress Notes (Signed)
Patient ID: Phillip Young, male   DOB: August 19, 1985, 35 y.o.   MRN: [id number] [facility] Progress Note   Assessment/ Plan:   1. Acute kidney Injury: Secondary to cardiogenic shock/hemodynamically mediated in the setting of atrial flutter with RVR and underlying severe biventricular congestive heart failure.  Net -43.5 L fluid balance since admission (-3 L overnight).  Charted as anuric but patient reports voiding some urine while having bowel movement and minimal amounts in urinal.  The plan is to continue CRRT for the next 24 hours and then discontinue this and monitor for potential renal recovery versus ability to transition to intermittent hemodialysis. 2.  Hyponatremia: Secondary to CHF/impaired free water handling in the setting of acute kidney injury.  We will increase dialysate flow rate to 3 L because of administration of 3% saline is challenging for lack of IV access. 3.  Hyperkalemia: Corrected with adjustment of CRRT prescription. 4.  Biventricular congestive heart failure: Continue CRRT for volume unloading after refractory to diuretics.  Unfortunately not a candidate for advanced therapies due to morbid obesity  5.  Atrial flutter with RVR: He underwent successful cardioversion yesterday, currently in sinus rhythm on amiodarone and Eliquis  Subjective:   Acute events noted overnight with vomiting/unresponsiveness while attempting to place peripheral IV.  He did not have any critical arrhythmia or respiratory distress.  Birthday today.   Objective:   BP (!) 160/72   Pulse 90   Temp 97.9 F (36.6 C) (Tympanic)   Resp (!) 25   Ht '5\' 11"'$  (1.803 m)   Wt (!) 196 kg   SpO2 99%   BMI 60.27 kg/m   Intake/Output Summary (Last 24 hours) at 02/28/2021 0719 Last data filed at 02/28/2021 0700 Gross per 24 hour  Intake 4187.13 ml  Output 7222 ml  Net -3034.87 ml   Weight change: 1.4 kg  Physical Exam: Gen: Morbidly obese man, sitting comfortably in recliner, oxygen via  nasal cannula CVS: Pulse regular rhythm and normal rate, S1 and S2 normal Resp: Diminished breath sounds over bases, no distinct rales or rhonchi Abd: Soft, obese, nontender Ext: 1-2+ anasarca.  Temporary right IJ hemodialysis catheter-on CRRT  Imaging: No results found.  Labs: BMET Recent Labs  Lab 02/25/21 1624 02/26/21 0429 02/26/21 1600 02/27/21 0500 02/27/21 0645 02/27/21 1744 02/28/21 0306  NA 124* 124* 128* 126* 127* 124* 122*  K 4.9 5.1 4.7 5.8* 5.7* 4.9 4.3  CL 91* 89* 92* 89* 93* 88* 88*  CO2 20* 20* 24 25 20* 24 24  GLUCOSE 149* 153* 190* 114* 116* 108* 136*  BUN '20 17 15 15 17 19 '$ 21*  CREATININE 2.40* 2.51* 2.32* 2.72* 2.79* 3.09* 3.16*  CALCIUM 8.7* 9.4 8.7* 8.8* 9.0 8.7* 8.4*  PHOS 2.9 3.0 2.1* 3.9 3.6 2.8 2.2*   CBC Recent Labs  Lab 02/27/21 0500 02/27/21 0645 02/27/21 1744 02/28/21 0306  WBC 19.2* 16.9* 21.0* 19.4*  HGB 11.6* 11.9* 11.4* 10.8*  HCT 36.3* 36.6* 35.5* 33.5*  MCV 92.4 91.0 92.2 92.5  PLT 277 258 270 260    Medications:     (feeding supplement) PROSource Plus  30 mL Oral TID BM   benzonatate  100 mg Oral BID   chlorhexidine  15 mL Mouth Rinse BID   Chlorhexidine Gluconate Cloth  6 each Topical Daily   fentaNYL (SUBLIMAZE) injection  100-200 mcg Intravenous Once   glycerin (Pediatric)  2 suppository Rectal Once   insulin aspart  0-5 Units Subcutaneous QHS   insulin aspart  0-9 Units Subcutaneous TID WC   mouth rinse  15 mL Mouth Rinse q12n4p   midodrine  15 mg Oral TID WC   multivitamin  1 tablet Oral QHS   pantoprazole  40 mg Oral Daily   polyethylene glycol  17 g Oral QHS   senna-docusate  2 tablet Oral BID   sodium chloride flush  10-40 mL Intracatheter Q12H   sodium chloride flush  3 mL Intravenous Q12H   Elmarie Shiley, MD 02/28/2021, 7:19 AM

## 2021-02-28 NOTE — Plan of Care (Signed)

## 2021-02-28 NOTE — Evaluation (Signed)
Physical Therapy Evaluation Patient Details Name: Phillip Young MRN: [id number] DOB: [birth year] Today's Date: 02/28/2021   History of Present Illness  35 y.o. year-old admitted 7/22 w/ SOB x 1 week. CHF exacerbation. Atrial w/ RVR may have been cause of decomp CHF per cardiology team.  ECHO showed EF <20%. BP's dropped and pt moved to ICU for suspected cardiogenic shock.CRRT started 7/24. PMH:  severe LV/ RV heart failure, super morbid obesity, HTN,  Clinical Impression   Pt admitted secondary to problem above with deficits below. PTA patient was independent with all mobiltiy and working as a Recruitment consultant. Pt currently is unable to ambulate due to CRRT and required education and supervision for therapeutic exercise. Anticipate good progress when he is off CRRT and able to progress to ambulation.  Anticipate patient will benefit from PT to address problems listed below.Will continue to follow acutely to maximize functional mobility independence and safety.       Follow Up Recommendations No PT follow up    Equipment Recommendations  None recommended by PT    Recommendations for Other Services       Precautions / Restrictions Precautions Precautions: None      Mobility  Bed Mobility Overal bed mobility: Modified Independent             Phillip bed mobility comments: HOB flat, uses leg momentum to assist with raising torso    Transfers Overall transfer level: Independent Equipment used: None             Phillip transfer comment: sit to stand from bed and from straight chair  Ambulation/Gait             Phillip Gait Details: unable due to CRRT  Stairs            Wheelchair Mobility    Modified Rankin (Stroke Patients Only)       Balance Overall balance assessment: Needs assistance Sitting-balance support: No upper extremity supported;Feet supported Sitting balance-Leahy Scale: Fair     Standing balance support: No upper extremity supported;During  functional activity Standing balance-Leahy Scale: Good Standing balance comment: No issues with static standing and dynamic appears fair as well although unable to challenge pts balance today due to pt refusing                             Pertinent Vitals/Pain Pain Assessment: No/denies pain    Home Living Family/patient expects to be discharged to:: Private residence Living Arrangements: Other relatives (uncle) Available Help at Discharge: Family;Available PRN/intermittently Type of Home: House Home Access: Level entry     Home Layout: One level Home Equipment: None      Prior Function Level of Independence: Independent         Comments: 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 Independent for ADLs/IADLs.     Hand Dominance        Extremity/Trunk Assessment   Upper Extremity Assessment Upper Extremity Assessment: Overall WFL for tasks assessed    Lower Extremity Assessment Lower Extremity Assessment: Generalized weakness    Cervical / Trunk Assessment Cervical / Trunk Assessment: Other exceptions Cervical / Trunk Exceptions: morvid obesity  Communication   Communication: No difficulties  Cognition Arousal/Alertness: Awake/alert Behavior During Therapy: WFL for tasks assessed/performed Overall Cognitive Status: Within Functional Limits for tasks assessed  Phillip Comments Phillip comments (skin integrity, edema, etc.): Sats >88%-98% througnout session; RR 26-36 with cues for slowing breathing    Exercises Phillip Exercises - Lower Extremity Long Arc Quad: Both;5 reps (educated to do while up in chair) Hip Flexion/Marching: Both;5 reps (educated to do while up in chair) Toe Raises: Both;5 reps Heel Raises: Both;5 reps Other Exercises Other Exercises: sit to stand x 5 reps; seated rest; x 5 reps   Assessment/Plan    PT Assessment Patient needs continued PT services  PT Problem List Decreased activity  tolerance;Decreased balance;Decreased mobility;Decreased knowledge of use of DME;Decreased safety awareness;Decreased strength;Cardiopulmonary status limiting activity;Obesity       PT Treatment Interventions DME instruction;Gait training;Functional mobility training;Therapeutic activities;Therapeutic exercise;Balance training;Patient/family education    PT Goals (Current goals can be found in the Care Plan section)  Acute Rehab PT Goals Patient Stated Goal: to get off the CRRT machine PT Goal Formulation: With patient Time For Goal Achievement: 03/14/21 Potential to Achieve Goals: Good    Frequency Min 3X/week   Barriers to discharge        Co-evaluation               AM-PAC PT "6 Clicks" Mobility  Outcome Measure Help needed turning from your back to your side while in a flat bed without using bedrails?: None Help needed moving from lying on your back to sitting on the side of a flat bed without using bedrails?: None Help needed moving to and from a bed to a chair (including a wheelchair)?: None Help needed standing up from a chair using your arms (e.g., wheelchair or bedside chair)?: None Help needed to walk in hospital room?: A Little Help needed climbing 3-5 steps with a railing? : A Little 6 Click Score: 22    End of Session Equipment Utilized During Treatment: Oxygen Activity Tolerance: Patient tolerated treatment well Patient left: with call bell/phone within reach;with nursing/sitter in room;in chair Nurse Communication: Mobility status PT Visit Diagnosis: Muscle weakness (generalized) (M62.81)    Time: [id number] PT Time Calculation (min) (ACUTE ONLY): 24 min   Charges:   PT Evaluation $PT Eval Low Complexity: 1 Low PT Treatments $Therapeutic Exercise: 8-22 mins         Arby Barrette, PT Pager [phone]   Rexanne Mano 02/28/2021, 3:04 PM

## 2021-03-01 ENCOUNTER — Inpatient Hospital Stay (HOSPITAL_COMMUNITY): Payer: BC Managed Care – PPO

## 2021-03-01 DIAGNOSIS — I5023 Acute on chronic systolic (congestive) heart failure: Secondary | ICD-10-CM | POA: Diagnosis not present

## 2021-03-01 LAB — RENAL FUNCTION PANEL
Albumin: 2.9 g/dL — ABNORMAL LOW (ref 3.5–5.0)
Albumin: 2.9 g/dL — ABNORMAL LOW (ref 3.5–5.0)
Anion gap: 10 (ref 5–15)
Anion gap: 11 (ref 5–15)
BUN: 20 mg/dL (ref 6–20)
BUN: 22 mg/dL — ABNORMAL HIGH (ref 6–20)
CO2: 26 mmol/L (ref 22–32)
CO2: 25 mmol/L (ref 22–32)
Calcium: 8.7 mg/dL — ABNORMAL LOW (ref 8.9–10.3)
Calcium: 8.3 mg/dL — ABNORMAL LOW (ref 8.9–10.3)
Chloride: 91 mmol/L — ABNORMAL LOW (ref 98–111)
Chloride: 91 mmol/L — ABNORMAL LOW (ref 98–111)
Creatinine, Ser: 2.66 mg/dL — ABNORMAL HIGH (ref 0.61–1.24)
Creatinine, Ser: 2.85 mg/dL — ABNORMAL HIGH (ref 0.61–1.24)
GFR, Estimated: 31 mL/min — ABNORMAL LOW (ref >60)
GFR, Estimated: 28 mL/min — ABNORMAL LOW (ref >60)
Glucose, Bld: 97 mg/dL (ref 70–99)
Glucose, Bld: 127 mg/dL — ABNORMAL HIGH (ref 70–99)
Phosphorus: 2.1 mg/dL — ABNORMAL LOW (ref 2.5–4.6)
Phosphorus: 2.1 mg/dL — ABNORMAL LOW (ref 2.5–4.6)
Potassium: 3.7 mmol/L (ref 3.5–5.1)
Potassium: 3.8 mmol/L (ref 3.5–5.1)
Sodium: 127 mmol/L — ABNORMAL LOW (ref 135–145)
Sodium: 127 mmol/L — ABNORMAL LOW (ref 135–145)

## 2021-03-01 LAB — GLUCOSE, CAPILLARY
Glucose-Capillary: 95 mg/dL (ref 70–99)
Glucose-Capillary: 131 mg/dL — ABNORMAL HIGH (ref 70–99)
Glucose-Capillary: 170 mg/dL — ABNORMAL HIGH (ref 70–99)
Glucose-Capillary: 159 mg/dL — ABNORMAL HIGH (ref 70–99)
Glucose-Capillary: 215 mg/dL — ABNORMAL HIGH (ref 70–99)

## 2021-03-01 LAB — COOXEMETRY PANEL
Carboxyhemoglobin: 1.2 % (ref 0.5–1.5)
Carboxyhemoglobin: 1.5 % (ref 0.5–1.5)
Carboxyhemoglobin: 1.3 % (ref 0.5–1.5)
Methemoglobin: 0.8 % (ref 0.0–1.5)
Methemoglobin: 1 % (ref 0.0–1.5)
Methemoglobin: 0.7 % (ref 0.0–1.5)
O2 Saturation: 36.5 %
O2 Saturation: 42.3 %
O2 Saturation: 55 %
Total hemoglobin: 12.2 g/dL (ref 12.0–16.0)
Total hemoglobin: 11.7 g/dL — ABNORMAL LOW (ref 12.0–16.0)
Total hemoglobin: 11.9 g/dL — ABNORMAL LOW (ref 12.0–16.0)

## 2021-03-01 LAB — MAGNESIUM: Magnesium: 2.5 mg/dL — ABNORMAL HIGH (ref 1.7–2.4)

## 2021-03-01 LAB — CBC
HCT: 35.2 % — ABNORMAL LOW (ref 39.0–52.0)
Hemoglobin: 11.4 g/dL — ABNORMAL LOW (ref 13.0–17.0)
MCH: 29.9 pg (ref 26.0–34.0)
MCHC: 32.4 g/dL (ref 30.0–36.0)
MCV: 92.4 fL (ref 80.0–100.0)
Platelets: 197 10*3/uL (ref 150–400)
RBC: 3.81 MIL/uL — ABNORMAL LOW (ref 4.22–5.81)
RDW: 15.5 % (ref 11.5–15.5)
WBC: 19.5 10*3/uL — ABNORMAL HIGH (ref 4.0–10.5)
nRBC: 0.8 % — ABNORMAL HIGH (ref 0.0–0.2)

## 2021-03-01 LAB — HEPARIN LEVEL (UNFRACTIONATED)
Heparin Unfractionated: 0.23 IU/mL — ABNORMAL LOW (ref 0.30–0.70)
Heparin Unfractionated: 0.26 IU/mL — ABNORMAL LOW (ref 0.30–0.70)

## 2021-03-01 IMAGING — DX DG CHEST 1V PORT
2 series · 2 of 2 positions shown · non-contrast
Comparison: [DATE]

CLINICAL DATA: Shortness of breath.  CHF.

EXAM:
PORTABLE CHEST 1 VIEW

[chest ap (1 of 2)]
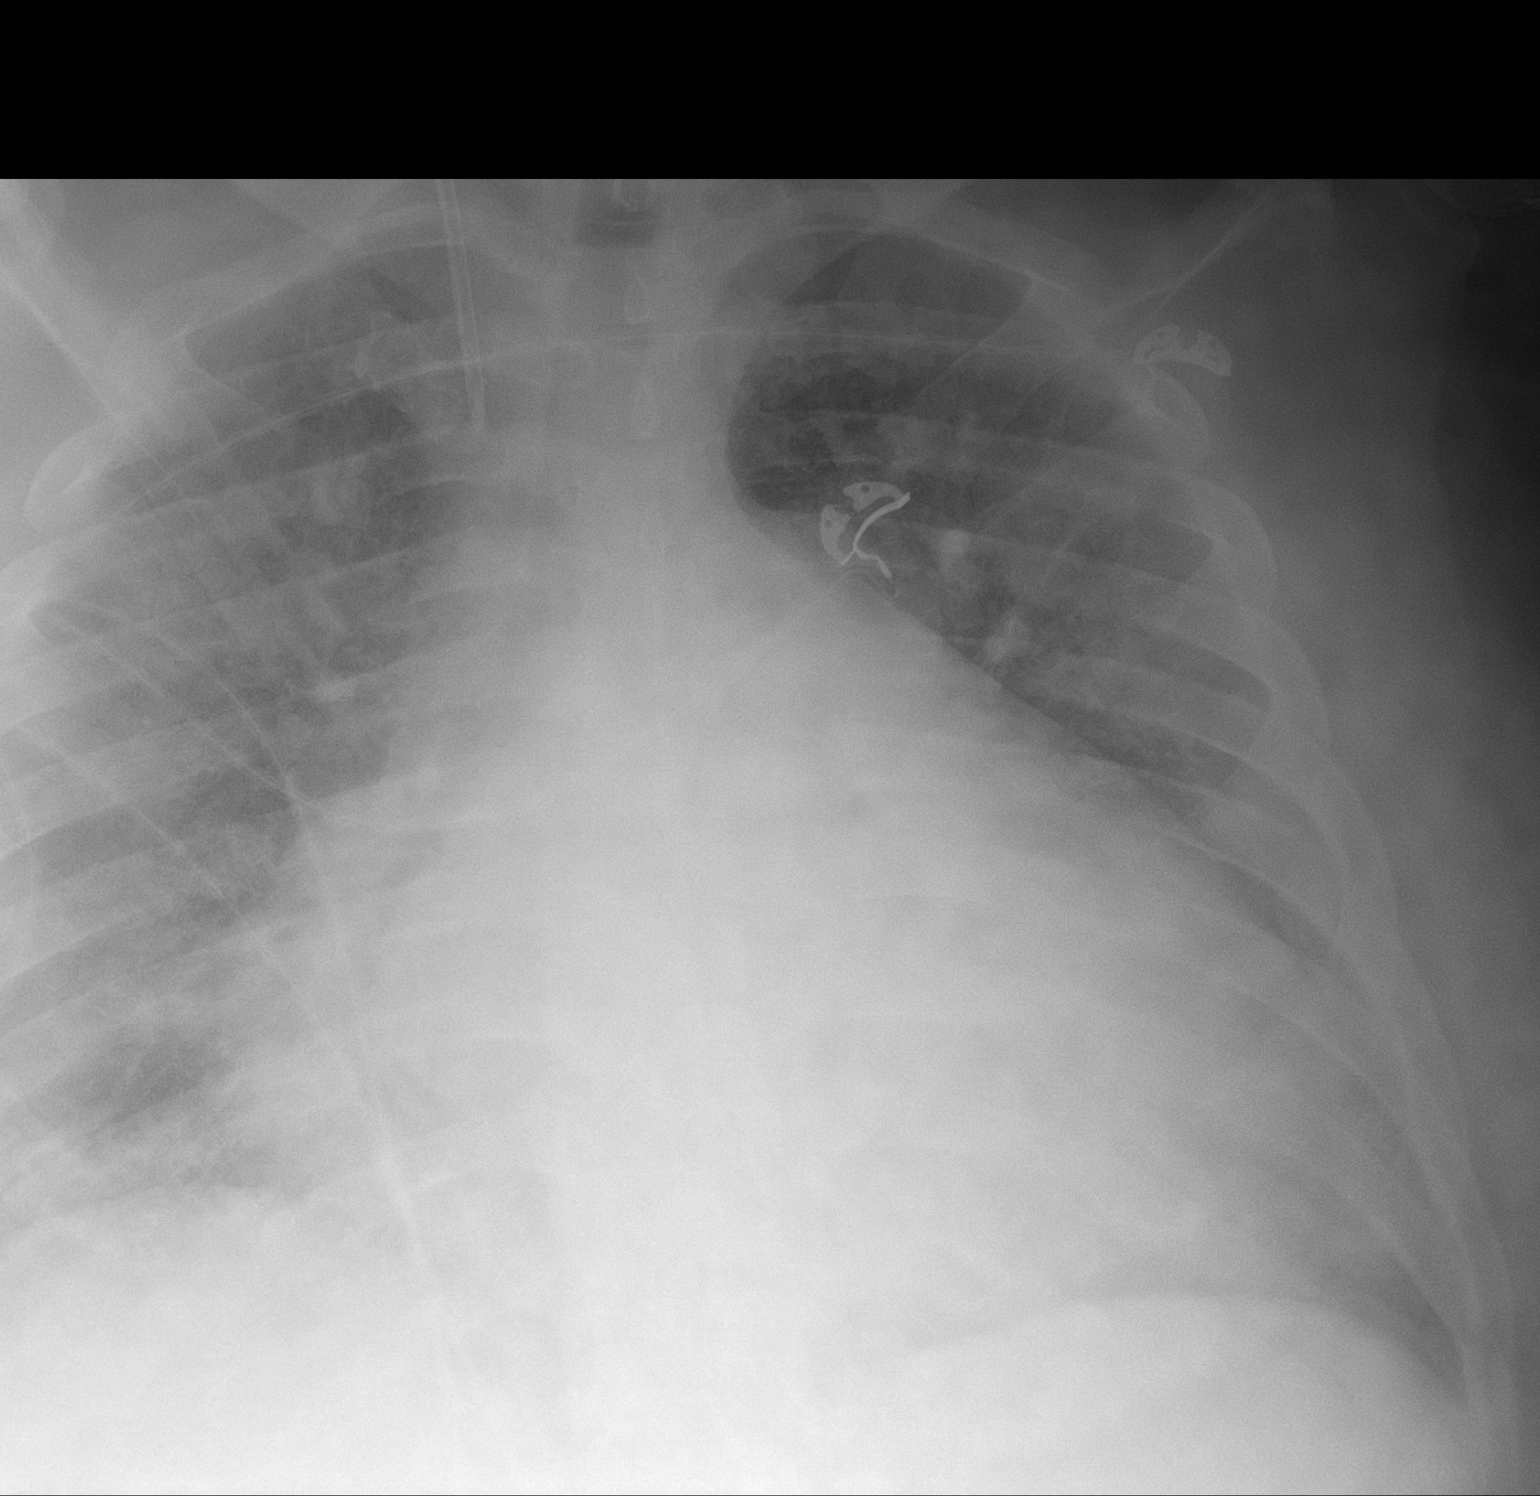

[chest ap (2 of 2)]
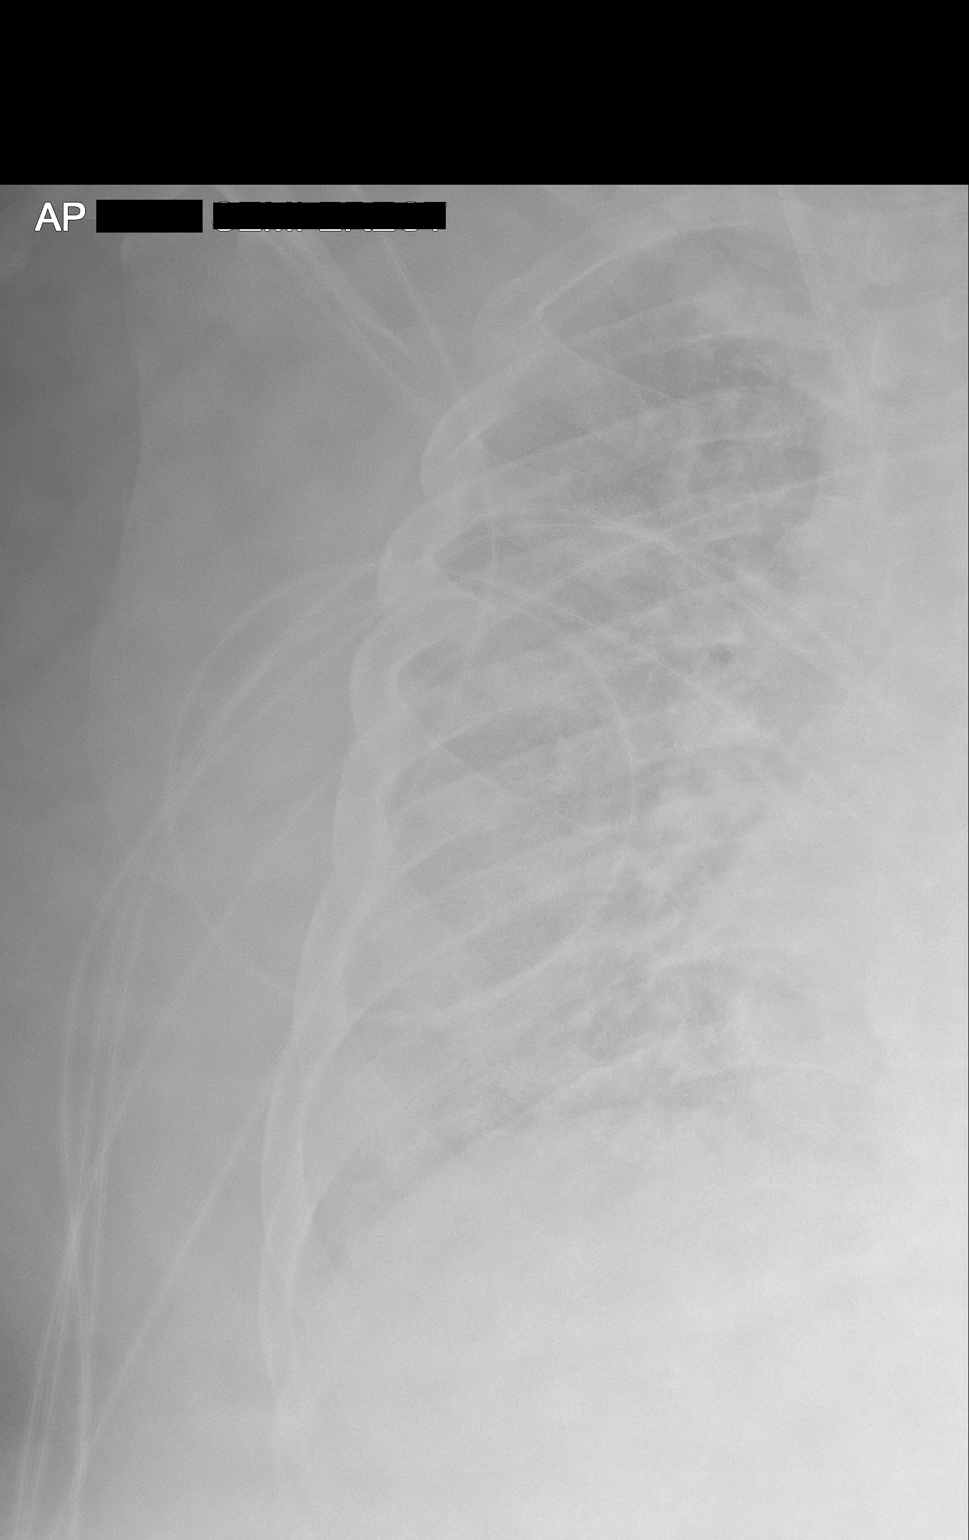

[2 of 2 positions shown; findings below may reference images not displayed]

FINDINGS: Patient has RIGHT IJ central line, tip overlying the UPPER superior
vena cava. Stable marked cardiomegaly. Pulmonary vascular congestion
appears stable. No focal consolidations or pleural effusions.
IMPRESSION: Stable cardiomegaly and vascular congestion.

## 2021-03-01 MED ORDER — NOREPINEPHRINE 16 MG/250ML-% IV SOLN
3.0000 ug/min | INTRAVENOUS | Status: DC
  Administered 2021-03-01 – 2021-03-05 (×3): 6 ug/min via INTRAVENOUS
  Administered 2021-03-06 – 2021-03-09 (×2): 5 ug/min via INTRAVENOUS
  Filled 2021-03-01 (×6): qty 250

## 2021-03-01 MED ORDER — SORBITOL 70 % SOLN
30.0000 mL | Freq: Once | Status: AC
  Administered 2021-03-01: 30 mL via ORAL
  Filled 2021-03-01: qty 30

## 2021-03-01 MED ORDER — SALINE SPRAY 0.65 % NA SOLN
1.0000 | NASAL | Status: DC | PRN
  Filled 2021-03-01: qty 44

## 2021-03-01 MED ORDER — POLYETHYLENE GLYCOL 3350 17 G PO PACK
17.0000 g | PACK | Freq: Two times a day (BID) | ORAL | Status: DC
  Administered 2021-03-01: 17 g via ORAL
  Filled 2021-03-01: qty 1

## 2021-03-01 MED ORDER — SORBITOL 70 % SOLN: 30.0000 mL | Freq: Once | Status: DC

## 2021-03-01 MED ORDER — SODIUM CHLORIDE 0.9 % IV SOLN
600.0000 [IU]/h | INTRAVENOUS | Status: DC
  Administered 2021-03-01 – 2021-03-02 (×3): 600 [IU]/h via INTRAVENOUS_CENTRAL
  Filled 2021-03-01 (×3): qty 2

## 2021-03-01 NOTE — Progress Notes (Signed)
ANTICOAGULATION CONSULT NOTE - Follow Up Consult  Pharmacy Consult for heparin Indication: atrial fibrillation  Labs: Recent Labs    02/27/21 1744 02/28/21 0306 02/28/21 1600 03/01/21 0455 03/01/21 1808 03/01/21 2238  HGB 11.4* 10.8*  --  11.4*  --   --   HCT 35.5* 33.5*  --  35.2*  --   --   PLT 270 260  --  197  --   --   HEPARINUNFRC  --  0.29*  --  0.23*  --  0.26*  CREATININE 3.09* 3.16* 3.12* 2.66* 2.85*  --     Assessment: 35yo male remains subtherapeutic on heparin after rate change; no infusion issues or signs of bleeding per RN.  Goal of Therapy:  Heparin level 0.3-0.7 units/ml   Plan:  Will increase systemic heparin infusion by 2 units/kgABW/hr to 3300 units/hr and check level in 6-8 hours.    Wynona Neat, PharmD, BCPS  03/01/2021,11:21 PM

## 2021-03-01 NOTE — Progress Notes (Signed)
Pt places self on/off cpap/bipap when ready.  RT will continue to monitor.

## 2021-03-01 NOTE — Progress Notes (Signed)
Significant increase in oxygen demands after coming off CPAP this morning. 6L [place] to 12L HFNC despite consistent fluid removal on dialysis.  Notified nephrology and heart failure teams of change in status. Increased fluid removal to 300 mL/hr.   Education given by myself, Dr. Posey Pronto, and Dr. Haroldine Laws explaining importance of following strict fluid restriction. Pt agreed to comply with orders.

## 2021-03-01 NOTE — Progress Notes (Addendum)
Witnessed visitor giving pt fluid despite only having 165m left until midnight.   Education provided to both pt and visitor about importance of following fluid restriction.  Pt stated, "If a visitor gives me something, I am going to drink it".  Pt has been updated multiple times throughout shift of remaining fluid intake until midnight. Despite this, pt asked for more fluids while RN was not in room.  Fluid intake since midnight: 1266 w/ meds to still take tonight.

## 2021-03-01 NOTE — Progress Notes (Signed)
Patient ID: Phillip Young, male   DOB: 1986/01/02, 35 y.o.   MRN: [id number]     Advanced Heart Failure Rounding Note  PCP-Cardiologist: None   Subjective:    Underwent DCCV 8/5 with successful conversion to NSR. Remains in NSR  Remains on DBA 7.5. Co-ox 36% early this am. Recheck 42%.   On CVVHD. Now pulling -300. Weight down 8 pounds overnight (66 pounds total) but more SOB this am. + Orthopnea.    Echo: EF < 20%, severe LV dilation, RV not well visualized.   This morning, he is sitting up in a chair and vomited earlier.  He appears mildly short of breath.  He denies pain, but endorses frustration with his fluid restriction as well as concern about his lack of renal recovery.     Objective:   Weight Range: (!) 192.7 kg Body mass index is 59.25 kg/m.   Vital Signs:   Temp:  [97.5 F (36.4 C)-98.1 F (36.7 C)] 97.9 F (36.6 C) (08/07 1100) Pulse Rate:  [81-92] 88 (08/07 1000) Resp:  [14-37] 37 (08/07 1000) BP: (93-153)/(47-92) 124/64 (08/07 1000) SpO2:  [90 %-100 %] 90 % (08/07 1000) Weight:  [192.7 kg] 192.7 kg (08/07 1000) Last BM Date: 02/27/21  Weight change: Filed Weights   02/27/21 0610 02/28/21 0500 03/01/21 1000  Weight: (!) 194.6 kg (!) 196 kg (!) 192.7 kg    Intake/Output:   Intake/Output Summary (Last 24 hours) at 03/01/2021 1155 Last data filed at 03/01/2021 1030 Gross per 24 hour  Intake 2908.18 ml  Output 7618 ml  Net -4709.82 ml       Physical Exam   Obese male sitting up. + dyspnea at rest HEENT: normal Neck: supple. + HD cath Carotids 2+ bilat; no bruits. No lymphadenopathy or thryomegaly appreciated. Cor: PMI nondisplaced. Regular rate & rhythm.Distant HS Lungs: clear decreased at bases Abdomen: markedly obese soft, nontender, nondistended. No hepatosplenomegaly. No bruits or masses. Good bowel sounds. Extremities: no cyanosis, clubbing, rash, 3+ edema + UNNA Neuro: alert & orientedx3, cranial nerves grossly intact. moves all 4 extremities  w/o difficulty. Affect pleasant  Telemetry    NSR 80-90s Personally reviewed  Labs    CBC Recent Labs    02/28/21 0306 03/01/21 0455  WBC 19.4* 19.5*  HGB 10.8* 11.4*  HCT 33.5* 35.2*  MCV 92.5 92.4  PLT 260 [id number]    Basic Metabolic Panel Recent Labs    02/28/21 0306 02/28/21 1600 03/01/21 0455  NA 122* 124* 127*  K 4.3 4.0 3.7  CL 88* 90* 91*  CO2 '24 24 26  '$ GLUCOSE 136* 127* 97  BUN 21* 23* 20  CREATININE 3.16* 3.12* 2.66*  CALCIUM 8.4* 8.7* 8.7*  MG 2.5*  --  2.5*  PHOS 2.2* 2.3* 2.1*    Liver Function Tests Recent Labs    02/28/21 1600 03/01/21 0455  ALBUMIN 3.0* 2.9*     No results for input(s): LIPASE, AMYLASE in the last 72 hours. Cardiac Enzymes No results for input(s): CKTOTAL, CKMB, CKMBINDEX, TROPONINI in the last 72 hours.  BNP: BNP (last 3 results) Recent Labs    10/10/20 1336 11/07/20 0932 02/11/21 1500  BNP 126.9* 106.0* 194.3*     ProBNP (last 3 results) No results for input(s): PROBNP in the last 8760 hours.   D-Dimer No results for input(s): DDIMER in the last 72 hours.  Hemoglobin A1C No results for input(s): HGBA1C in the last 72 hours.  Fasting Lipid Panel No results for input(s): CHOL, HDL, LDLCALC,  TRIG, CHOLHDL, LDLDIRECT in the last 72 hours. Thyroid Function Tests No results for input(s): TSH, T4TOTAL, T3FREE, THYROIDAB in the last 72 hours.  Invalid input(s): FREET3   Other results:   Imaging    No results found.   Medications:     Scheduled Medications:  (feeding supplement) PROSource Plus  30 mL Oral TID BM   benzonatate  100 mg Oral BID   chlorhexidine  15 mL Mouth Rinse BID   Chlorhexidine Gluconate Cloth  6 each Topical Daily   fentaNYL (SUBLIMAZE) injection  100-200 mcg Intravenous Once   glycerin (Pediatric)  2 suppository Rectal Once   insulin aspart  0-5 Units Subcutaneous QHS   insulin aspart  0-9 Units Subcutaneous TID WC   mouth rinse  15 mL Mouth Rinse q12n4p   midodrine  15 mg  Oral TID WC   multivitamin  1 tablet Oral QHS   pantoprazole  40 mg Oral Daily   polyethylene glycol  17 g Oral BID   senna-docusate  2 tablet Oral BID   sodium chloride flush  10-40 mL Intracatheter Q12H   sodium chloride flush  3 mL Intravenous Q12H    Infusions:  sodium chloride Stopped (02/27/21 1651)   amiodarone 30 mg/hr (03/01/21 1000)   DOBUTamine 7.5 mcg/kg/min (03/01/21 1000)   heparin 3,050 Units/hr (03/01/21 1000)   prismasol BGK 2/2.5 dialysis solution 3,000 mL/hr at 03/01/21 1040   prismasol BGK 2/2.5 replacement solution 500 mL/hr at 03/01/21 0830   prismasol BGK 2/2.5 replacement solution 400 mL/hr at 03/01/21 0300    PRN Medications: sodium chloride, acetaminophen **OR** acetaminophen, alum & mag hydroxide-simeth, diphenhydrAMINE, guaiFENesin-dextromethorphan, heparin, heparin, menthol-cetylpyridinium, ondansetron (ZOFRAN) IV, oxyCODONE, sodium chloride, sodium chloride flush, sodium chloride flush  Assessment/Plan   1. Acute on Chronic Biventricular Heart Failure -> cardiogenic shock:  Known NICM since 2019, previously followed [facility].  Echo 8/21 EF < 20%, moderate LV dilation, severely decreased RV function, severe biatrial enlargement, mild MR. No history of ETOH/drugs.  No FH of cardiomyopathy (mother with Emerson).  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  He is too large for cardiac MRI. RHC/LHC on 03/04/20 with no CAD low CI at 2.1. Evaluated by EP but not a candidate for ICD given BMI.  Echo this admission with EF < 20%, RV poorly visualized. He now has CHF exacerbation/shock likely in setting of AFL with RVR.  He was hypotensive with poor UOP, concern for cardiogenic shock.  Dobutamine therefore started at 2.5 and increased to 7.5.  NE weaned off.  With progressive renal failure, CVVH started. Co-ox low this am. CVP remains elevated (22). On CVVHD pulling -300/hr - Now in NSR after successful DCCV on 02/27/2021.  -  Continue dobutamine at 7.5. Given worsening HF symptoms despite fluid removal will add back NE.  - Unfortunately, suspect he will be HD dependent - With AKI/hypotension, stopped Entresto, dapagliflozin, spironolactone, digoxin.  - With marked obesity, he is not a candidate for advanced therapies.  - Long-term plan remains unclear given size and need for intorope support likely not good candidate for HD.   2. Aflutter w/ RVR:  Noted to be in Afib during a prior admission in 3/22 in the setting of CAP and a/c CHF, but converted to NSR on amiodarone. Discharged home w/ eliquis. Was in 2:1 AFL this admit in setting of a/c CHF w/ marked volume overload, + recent gastroenteritis w/ diarrhea. Has OSA but not using CPAP. TSH WNL. Admits to  intermittent compliance w/ Eliquis. Has missed several doses in the last month. Now in NSR s/p DCCV on 8/5. - c/w amio drip at 30 mg per hour.  - Continue Eliquis 5 mg bid.   3. AKI: Creatinine peaked to 3.32.Concern for cardiorenal syndrome.  Renal US with no hydronephrosis. Remains on CVVHD and aiming for -300 mL/hr.  Concerned he will end up HD-dependent.  - Nephrology appreciated.   4. Acute Hypoxic Respiratory Failure: Suspect underlying OHS/OSA exacerbated by CHF this admission.  - Continue oxygen + CPAP.  - remains tenuous   5. Type 2 DM: Farxiga held with AKI.   6. OSA: Has not been on Bipap at home.   7. Super Morbid Obesity: Body mass index is 59.25 kg/m.  8. UTI: Suspect by UA, sent urine culture.  ?Urinary obstruction related to this => no hydronephrosis on renal US.  - Ceftriaxone IV course completed.   9. Hyponatremia: Na 127 - on CVVHD, management per nephrology   10. Leukocytosis: WBC elevated but stable at 19K. AF - BCx NGTD   CRITICAL CARE Performed by: Glori Bickers  Total critical care time: 45 minutes  Critical care time was exclusive of separately billable procedures and treating other patients.  Critical care was necessary  to treat or prevent imminent or life-threatening deterioration.  Critical care was time spent personally by me (independent of midlevel providers or residents) on the following activities: development of treatment plan with patient and/or surrogate as well as nursing, discussions with consultants, evaluation of patient's response to treatment, examination of patient, obtaining history from patient or surrogate, ordering and performing treatments and interventions, ordering and review of laboratory studies, ordering and review of radiographic studies, pulse oximetry and re-evaluation of patient's condition.   Length of Stay: 76  Glori Bickers, MD  03/01/2021, 11:55 AM  Advanced Heart Failure Team Pager [phone] (M-F; 7a - 5p)

## 2021-03-01 NOTE — Progress Notes (Signed)
Patient ID: Phillip Young, male   DOB: 01-13-1986, 35 y.o.   MRN: [id number] [facility] Progress Note   Assessment/ Plan:   1. Acute kidney Injury: Secondary to cardiogenic shock/hemodynamically mediated in the setting of atrial flutter with RVR and underlying severe biventricular congestive heart failure.  Net -49.5 L fluid balance since admission (-4.7 L overnight).  Unfortunately, CVP remains elevated and the biggest challenge has been restricting his oral intake.  He is increasingly hypoxic/dyspneic this morning and we will continue CRRT with net ultrafiltration of 300 cc/h. 2.  Hyponatremia: Secondary to CHF/impaired free water handling with poorly restricted free water intake-this was tightened to 1.2 L yesterday and nursing staff instructed to enforce this (patient previously drinking up to 3 L a day).  Will increase rate of ultrafiltration to 300 cc a day. 3.  Hyperkalemia: Corrected with adjustment of CRRT prescription. 4.  Biventricular congestive heart failure: Continue CRRT for volume unloading after refractory to diuretics.  Unfortunately not a candidate for advanced therapies due to morbid obesity  5.  Atrial flutter with RVR: He underwent successful cardioversion yesterday, currently in sinus rhythm on amiodarone and Eliquis  Subjective:   With increasingly worsening shortness of breath this morning and elevated CVP of 20 cmH2O.  Reeducated about the need for fluid restriction and the reality of ending up on dialysis.   Objective:   BP 136/75   Pulse 89   Temp 97.8 F (36.6 C) (Oral)   Resp 18   Ht '5\' 11"'$  (1.803 m)   Wt (!) 196 kg   SpO2 96%   BMI 60.27 kg/m   Intake/Output Summary (Last 24 hours) at 03/01/2021 0809 Last data filed at 03/01/2021 0754 Gross per 24 hour  Intake 3226.23 ml  Output 7692 ml  Net -4465.77 ml   Weight change:   Physical Exam: Gen: Morbidly obese man, appears uncomfortable resting in bed, dyspneic CVS: Pulse regular rhythm and  normal rate, S1 and S2 normal Resp: Coarse breath sounds bilaterally with rhonchi Abd: Soft, obese, nontender Ext: 1-2+ anasarca.  Temporary right IJ hemodialysis catheter-on CRRT  Imaging: No results found.  Labs: BMET Recent Labs  Lab 02/26/21 1600 02/27/21 0500 02/27/21 0645 02/27/21 1744 02/28/21 0306 02/28/21 1600 03/01/21 0455  NA 128* 126* 127* 124* 122* 124* 127*  K 4.7 5.8* 5.7* 4.9 4.3 4.0 3.7  CL 92* 89* 93* 88* 88* 90* 91*  CO2 24 25 20* '24 24 24 26  '$ GLUCOSE 190* 114* 116* 108* 136* 127* 97  BUN '15 15 17 19 '$ 21* 23* 20  CREATININE 2.32* 2.72* 2.79* 3.09* 3.16* 3.12* 2.66*  CALCIUM 8.7* 8.8* 9.0 8.7* 8.4* 8.7* 8.7*  PHOS 2.1* 3.9 3.6 2.8 2.2* 2.3* 2.1*   CBC Recent Labs  Lab 02/27/21 0645 02/27/21 1744 02/28/21 0306 03/01/21 0455  WBC 16.9* 21.0* 19.4* 19.5*  HGB 11.9* 11.4* 10.8* 11.4*  HCT 36.6* 35.5* 33.5* 35.2*  MCV 91.0 92.2 92.5 92.4  PLT 258 270 260 197    Medications:     (feeding supplement) PROSource Plus  30 mL Oral TID BM   benzonatate  100 mg Oral BID   chlorhexidine  15 mL Mouth Rinse BID   Chlorhexidine Gluconate Cloth  6 each Topical Daily   fentaNYL (SUBLIMAZE) injection  100-200 mcg Intravenous Once   glycerin (Pediatric)  2 suppository Rectal Once   insulin aspart  0-5 Units Subcutaneous QHS   insulin aspart  0-9 Units Subcutaneous TID WC   mouth rinse  15 mL Mouth Rinse q12n4p   midodrine  15 mg Oral TID WC   multivitamin  1 tablet Oral QHS   pantoprazole  40 mg Oral Daily   polyethylene glycol  17 g Oral QHS   senna-docusate  2 tablet Oral BID   sodium chloride flush  10-40 mL Intracatheter Q12H   sodium chloride flush  3 mL Intravenous Q12H   Elmarie Shiley, MD 03/01/2021, 8:09 AM

## 2021-03-01 NOTE — Progress Notes (Signed)
ANTICOAGULATION CONSULT NOTE- Follow-Up Note  Pharmacy Consult for Heparin  Indication: atrial fibrillation  Patient Measurements: Height: '5\' 11"'$  (180.3 cm) Weight:  (Patient refused standing weight at this time) IBW/kg (Calculated) : 75.3 HEPARIN DW (KG): 130.5   Vital Signs: Temp: 97.8 F (36.6 C) (08/07 0359) Temp Source: Oral (08/07 0359) BP: 136/75 (08/07 0700) Pulse Rate: 89 (08/07 0700)  Labs: Recent Labs    02/27/21 0645 02/27/21 1744 02/28/21 0306 02/28/21 1600 03/01/21 0455  HGB 11.9* 11.4* 10.8*  --  11.4*  HCT 36.6* 35.5* 33.5*  --  35.2*  PLT 258 270 260  --  197  HEPARINUNFRC 0.38  --  0.29*  --  0.23*  CREATININE 2.79* 3.09* 3.16* 3.12* 2.66*     Estimated Creatinine Clearance: 67.1 mL/min (A) (by C-G formula based on SCr of 2.66 mg/dL (H)).    Assessment 35 yo male on apixaban for afib. He has acute HF and on CRRT and plans to start heparin. Last dose of apixaban given 7/29 ~ 10pm.  Afib > SR after DCCv 8/5  Amiodarone drip '30mg'$ /hr Heparin level at low end of therapeutic at 0.23 on heparin drip 2950uts/hr  CBC stable.  Goal of Therapy:  Heparin level 0.3-0.7 units/ml Monitor platelets by anticoagulation protocol: Yes   Plan:   Increase slightly  heparin 3050 units/h -Daily heparin level and CBC   Bonnita Nasuti Pharm.D. CPP, BCPS Clinical Pharmacist [phone] 03/01/2021 8:40 AM   Please check AMION for all [place] numbers 03/01/2021

## 2021-03-02 DIAGNOSIS — I5023 Acute on chronic systolic (congestive) heart failure: Secondary | ICD-10-CM | POA: Diagnosis not present

## 2021-03-02 LAB — CBC
HCT: 36.9 % — ABNORMAL LOW (ref 39.0–52.0)
Hemoglobin: 12 g/dL — ABNORMAL LOW (ref 13.0–17.0)
MCH: 29.7 pg (ref 26.0–34.0)
MCHC: 32.5 g/dL (ref 30.0–36.0)
MCV: 91.3 fL (ref 80.0–100.0)
Platelets: 108 10*3/uL — ABNORMAL LOW (ref 150–400)
RBC: 4.04 MIL/uL — ABNORMAL LOW (ref 4.22–5.81)
RDW: 16.2 % — ABNORMAL HIGH (ref 11.5–15.5)
WBC: 18.8 10*3/uL — ABNORMAL HIGH (ref 4.0–10.5)
nRBC: 0.9 % — ABNORMAL HIGH (ref 0.0–0.2)

## 2021-03-02 LAB — RENAL FUNCTION PANEL
Albumin: 2.7 g/dL — ABNORMAL LOW (ref 3.5–5.0)
Albumin: 2.9 g/dL — ABNORMAL LOW (ref 3.5–5.0)
Anion gap: 11 (ref 5–15)
Anion gap: 14 (ref 5–15)
BUN: 21 mg/dL — ABNORMAL HIGH (ref 6–20)
BUN: 22 mg/dL — ABNORMAL HIGH (ref 6–20)
CO2: 23 mmol/L (ref 22–32)
CO2: 21 mmol/L — ABNORMAL LOW (ref 22–32)
Calcium: 8.5 mg/dL — ABNORMAL LOW (ref 8.9–10.3)
Calcium: 8.7 mg/dL — ABNORMAL LOW (ref 8.9–10.3)
Chloride: 91 mmol/L — ABNORMAL LOW (ref 98–111)
Chloride: 93 mmol/L — ABNORMAL LOW (ref 98–111)
Creatinine, Ser: 3.4 mg/dL — ABNORMAL HIGH (ref 0.61–1.24)
Creatinine, Ser: 3.47 mg/dL — ABNORMAL HIGH (ref 0.61–1.24)
GFR, Estimated: 23 mL/min — ABNORMAL LOW (ref >60)
GFR, Estimated: 22 mL/min — ABNORMAL LOW (ref >60)
Glucose, Bld: 108 mg/dL — ABNORMAL HIGH (ref 70–99)
Glucose, Bld: 113 mg/dL — ABNORMAL HIGH (ref 70–99)
Phosphorus: 2.2 mg/dL — ABNORMAL LOW (ref 2.5–4.6)
Phosphorus: 3.2 mg/dL (ref 2.5–4.6)
Potassium: 4.1 mmol/L (ref 3.5–5.1)
Potassium: 4.2 mmol/L (ref 3.5–5.1)
Sodium: 125 mmol/L — ABNORMAL LOW (ref 135–145)
Sodium: 128 mmol/L — ABNORMAL LOW (ref 135–145)

## 2021-03-02 LAB — COOXEMETRY PANEL
Carboxyhemoglobin: 1.6 % — ABNORMAL HIGH (ref 0.5–1.5)
Methemoglobin: 0.9 % (ref 0.0–1.5)
O2 Saturation: 66.2 %
Total hemoglobin: 12 g/dL (ref 12.0–16.0)

## 2021-03-02 LAB — MRSA NEXT GEN BY PCR, NASAL: MRSA by PCR Next Gen: NOT DETECTED

## 2021-03-02 LAB — MAGNESIUM: Magnesium: 2.7 mg/dL — ABNORMAL HIGH (ref 1.7–2.4)

## 2021-03-02 LAB — HEPARIN LEVEL (UNFRACTIONATED): Heparin Unfractionated: 0.44 IU/mL (ref 0.30–0.70)

## 2021-03-02 LAB — GLUCOSE, CAPILLARY
Glucose-Capillary: 174 mg/dL — ABNORMAL HIGH (ref 70–99)
Glucose-Capillary: 141 mg/dL — ABNORMAL HIGH (ref 70–99)
Glucose-Capillary: 155 mg/dL — ABNORMAL HIGH (ref 70–99)
Glucose-Capillary: 117 mg/dL — ABNORMAL HIGH (ref 70–99)

## 2021-03-02 MED ORDER — PRISMASOL BGK 4/2.5 32-4-2.5 MEQ/L REPLACEMENT SOLN: Status: DC

## 2021-03-02 MED ORDER — SORBITOL 70 % SOLN
30.0000 mL | Freq: Every day | Status: DC | PRN
  Administered 2021-03-05 – 2021-03-08 (×3): 30 mL via ORAL
  Filled 2021-03-02 (×3): qty 30

## 2021-03-02 MED ORDER — SODIUM PHOSPHATES 45 MMOLE/15ML IV SOLN
30.0000 mmol | Freq: Once | INTRAVENOUS | Status: AC
  Administered 2021-03-02: 30 mmol via INTRAVENOUS
  Filled 2021-03-02: qty 10

## 2021-03-02 MED ORDER — LIDOCAINE HCL (PF) 1 % IJ SOLN
INTRAMUSCULAR | Status: AC
  Administered 2021-03-02: 5 mL
  Filled 2021-03-02: qty 5

## 2021-03-02 MED ORDER — ORAL CARE MOUTH RINSE
15.0000 mL | Freq: Two times a day (BID) | OROMUCOSAL | Status: DC
  Administered 2021-03-04 – 2021-03-20 (×20): 15 mL via OROMUCOSAL

## 2021-03-02 MED ORDER — FAMOTIDINE 20 MG PO TABS
20.0000 mg | ORAL_TABLET | Freq: Every day | ORAL | Status: DC
  Filled 2021-03-02: qty 1

## 2021-03-02 MED ORDER — PRISMASOL BGK 4/2.5 32-4-2.5 MEQ/L EC SOLN: Status: DC

## 2021-03-02 MED ORDER — LIDOCAINE HCL (PF) 1 % IJ SOLN: 5.0000 mL | Freq: Once | INTRAMUSCULAR | Status: AC

## 2021-03-02 NOTE — Progress Notes (Signed)
ANTICOAGULATION CONSULT NOTE - Follow Up Consult  Pharmacy Consult for heparin Indication: atrial fibrillation  Labs: Recent Labs    02/28/21 0306 02/28/21 1600 03/01/21 0455 03/01/21 1808 03/01/21 2238 03/02/21 0615  HGB 10.8*  --  11.4*  --   --  12.0*  HCT 33.5*  --  35.2*  --   --  36.9*  PLT 260  --  197  --   --  PENDING  HEPARINUNFRC 0.29*  --  0.23*  --  0.26* 0.44  CREATININE 3.16*   < > 2.66* 2.85*  --  3.40*   < > = values in this interval not displayed.     Assessment: 35yo male remains therapeutic on heparin after rate change; no infusion issues or signs of bleeding per RN.  Goal of Therapy:  Heparin level 0.3-0.7 units/ml   Plan:  Will continue systemic heparin infusion at 3300 units/hr Check daily heparin level  Erin Hearing PharmD., BCPS Clinical Pharmacist 03/02/2021 7:36 AM

## 2021-03-02 NOTE — Progress Notes (Signed)
PT Cancellation Note  Patient Details Name: Phillip Young MRN: [id number] DOB: 1986-03-14   Cancelled Treatment:    Reason Eval/Treat Not Completed: Patient declined, no reason specified. 03/02/2021  Ginger Carne., PT Acute Rehabilitation Services [phone]  (pager) [phone]  (office)   Tessie Fass Quintel Mccalla 03/02/2021, 3:04 PM

## 2021-03-02 NOTE — Progress Notes (Addendum)
Patient ID: Phillip Young, male   DOB: Jan 23, 1986, 35 y.o.   MRN: [id number]     Advanced Heart Failure Rounding Note  PCP-Cardiologist: Dr. [name]  Subjective:    Underwent DCCV 8/5 with successful conversion to NSR. Remains in NSR  Remains on DBA 7.5 and NE 6. Co-ox 66%  On CVVHD, - 5L last 24 hrs. Weight stable. CVP 13.  Echo: EF < 20%, severe LV dilation, RV not well visualized.   Increased work of breathing yesterday, now on 12L HFNC. Less dyspnea today. Unable to wean 02. Fluid restriction remains an issue.  Difficulty with access, drawing labs.    Objective:   Weight Range: (!) 193.2 kg Body mass index is 59.41 kg/m.   Vital Signs:   Temp:  [97.4 F (36.3 C)-98.2 F (36.8 C)] 97.9 F (36.6 C) (08/08 0000) Pulse Rate:  [29-95] 88 (08/08 0600) Resp:  [16-44] 24 (08/08 0600) BP: (86-126)/(51-85) 102/58 (08/08 0600) SpO2:  [90 %-100 %] 93 % (08/08 0600) Weight:  [192.7 kg-193.2 kg] 193.2 kg (08/08 0500) Last BM Date: 03/01/21  Weight change: Filed Weights   02/28/21 0500 03/01/21 1000 03/02/21 0500  Weight: (!) 196 kg (!) 192.7 kg (!) 193.2 kg    Intake/Output:   Intake/Output Summary (Last 24 hours) at 03/02/2021 0703 Last data filed at 03/02/2021 0600 Gross per 24 hour  Intake 2910.09 ml  Output 7938 ml  Net -5027.91 ml      Physical Exam   Obese male sitting up. On 12HFNC HEENT: normal Neck: supple. + HD cath Carotids 2+ bilat; no bruits. No lymphadenopathy or thryomegaly appreciated. Cor: PMI nondisplaced. Regular rate & rhythm.Distant HS Lungs: clear decreased at bases Abdomen: markedly obese soft, nontender, nondistended. No hepatosplenomegaly. No bruits or masses. Good bowel sounds. Extremities: no cyanosis, clubbing, rash, 2-3 + edema Neuro: alert & orientedx3, cranial nerves grossly intact. moves all 4 extremities w/o difficulty. Affect pleasant  Telemetry    NSR, 80-90s Personally reviewed  Labs    CBC Recent Labs    02/28/21 0306  03/01/21 0455  WBC 19.4* 19.5*  HGB 10.8* 11.4*  HCT 33.5* 35.2*  MCV 92.5 92.4  PLT 260 [id number]   Basic Metabolic Panel Recent Labs    02/28/21 0306 02/28/21 1600 03/01/21 0455 03/01/21 1808 03/02/21 0615  NA 122*   < > 127* 127* 125*  K 4.3   < > 3.7 3.8 4.1  CL 88*   < > 91* 91* 91*  CO2 24   < > '26 25 23  '$ GLUCOSE 136*   < > 97 127* 108*  BUN 21*   < > 20 22* 21*  CREATININE 3.16*   < > 2.66* 2.85* 3.40*  CALCIUM 8.4*   < > 8.7* 8.3* 8.5*  MG 2.5*  --  2.5*  --   --   PHOS 2.2*   < > 2.1* 2.1* 2.2*   < > = values in this interval not displayed.   Liver Function Tests Recent Labs    03/01/21 1808 03/02/21 0615  ALBUMIN 2.9* 2.7*    No results for input(s): LIPASE, AMYLASE in the last 72 hours. Cardiac Enzymes No results for input(s): CKTOTAL, CKMB, CKMBINDEX, TROPONINI in the last 72 hours.  BNP: BNP (last 3 results) Recent Labs    10/10/20 1336 11/07/20 0932 02/11/21 1500  BNP 126.9* 106.0* 194.3*    ProBNP (last 3 results) No results for input(s): PROBNP in the last 8760 hours.   D-Dimer No results for  input(s): DDIMER in the last 72 hours.  Hemoglobin A1C No results for input(s): HGBA1C in the last 72 hours.  Fasting Lipid Panel No results for input(s): CHOL, HDL, LDLCALC, TRIG, CHOLHDL, LDLDIRECT in the last 72 hours. Thyroid Function Tests No results for input(s): TSH, T4TOTAL, T3FREE, THYROIDAB in the last 72 hours.  Invalid input(s): FREET3   Other results:   Imaging    DG CHEST PORT 1 VIEW  Result Date: 03/01/2021 CLINICAL DATA:  Shortness of breath.  CHF. EXAM: PORTABLE CHEST 1 VIEW COMPARISON:  02/23/2021 FINDINGS: Patient has RIGHT IJ central line, tip overlying the UPPER superior vena cava. Stable marked cardiomegaly. Pulmonary vascular congestion appears stable. No focal consolidations or pleural effusions. IMPRESSION: Stable cardiomegaly and vascular congestion. Electronically Signed   By: Nolon Nations M.D.   On: 03/01/2021  16:38     Medications:     Scheduled Medications:  (feeding supplement) PROSource Plus  30 mL Oral TID BM   benzonatate  100 mg Oral BID   chlorhexidine  15 mL Mouth Rinse BID   Chlorhexidine Gluconate Cloth  6 each Topical Daily   fentaNYL (SUBLIMAZE) injection  100-200 mcg Intravenous Once   glycerin (Pediatric)  2 suppository Rectal Once   insulin aspart  0-5 Units Subcutaneous QHS   insulin aspart  0-9 Units Subcutaneous TID WC   mouth rinse  15 mL Mouth Rinse q12n4p   multivitamin  1 tablet Oral QHS   pantoprazole  40 mg Oral Daily   polyethylene glycol  17 g Oral BID   senna-docusate  2 tablet Oral BID   sodium chloride flush  10-40 mL Intracatheter Q12H   sodium chloride flush  3 mL Intravenous Q12H    Infusions:  sodium chloride Stopped (02/27/21 1651)   amiodarone 30 mg/hr (03/02/21 0621)   DOBUTamine 7.5 mcg/kg/min (03/02/21 0620)   heparin 10,000 units/ 20 mL infusion syringe 600 Units/hr (03/02/21 0623)   heparin 3,300 Units/hr (03/02/21 0600)   norepinephrine (LEVOPHED) Adult infusion 6 mcg/min (03/02/21 0600)   prismasol BGK 2/2.5 dialysis solution 3,000 mL/hr at 03/01/21 2325   prismasol BGK 2/2.5 replacement solution 500 mL/hr at 03/01/21 1245   prismasol BGK 2/2.5 replacement solution 400 mL/hr at 03/01/21 1245   sodium phosphate  Dextrose 5% IVPB      PRN Medications: sodium chloride, acetaminophen **OR** acetaminophen, alum & mag hydroxide-simeth, diphenhydrAMINE, guaiFENesin-dextromethorphan, heparin, heparin, menthol-cetylpyridinium, ondansetron (ZOFRAN) IV, oxyCODONE, sodium chloride, sodium chloride, sodium chloride flush, sodium chloride flush  Assessment/Plan   1. Acute on Chronic Biventricular Heart Failure -> cardiogenic shock:  Known NICM since 2019, previously followed by [facility].  Echo 8/21 EF < 20%, moderate LV dilation, severely decreased RV function, severe biatrial enlargement, mild MR. No history of ETOH/drugs.  No FH of cardiomyopathy  (mother with [place]).  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  He is too large for cardiac MRI. RHC/LHC on 03/04/20 with no CAD low CI at 2.1. Evaluated by EP but not a candidate for ICD given BMI.  Echo this admission with EF < 20%, RV poorly visualized. He now has CHF exacerbation/shock likely in setting of AFL with RVR.  He was hypotensive with poor UOP, concern for cardiogenic shock.  Dobutamine therefore started at 2.5 and increased to 7.5.  NE weaned off but added back 08/07 at 6 mcg/min d/t worsening HF symptoms despite fluid removal.  Co-ox better today, 42% > 55% > 66%.  - With progressive renal failure, CVVH started.  Pulling 300 mL/hr, down to 250 mL/hr, -5L last 24 hr. CVP 13. - Fluid restriction has been an issue - discussed with patient again this am  - Now in NSR after successful DCCV on 02/27/2021.  - With AKI/hypotension, stopped Entresto, dapagliflozin, spironolactone, digoxin.  - Continue CRRT one more day. Will attempt to trial off CRRT tomorrow.  Unfortunately, suspect he will be HD dependent.  Long-term plan remains unclear given size and need for intorope support likely not good candidate for HD.  - With marked obesity, he is not a candidate for advanced therapies.  - Will arrange for family meeting with palliative care to discuss prognosis and goals of care.   2. Aflutter w/ RVR:  Noted to be in Afib during a prior admission in 3/22 in the setting of CAP and a/c CHF, but converted to NSR on amiodarone. Discharged home w/ eliquis. Was in 2:1 AFL this admit in setting of a/c CHF w/ marked volume overload, + recent gastroenteritis w/ diarrhea. Has OSA but not using CPAP. TSH WNL. Admits to intermittent compliance w/ Eliquis. Has missed several doses in the last month. Now in NSR s/p DCCV on 8/5. - c/w amio drip at 30 mg per hour.  - Continue Eliquis 5 mg bid.   3. AKI: Creatinine peaked to 3.40.Concern for cardiorenal syndrome.   Renal US with no hydronephrosis. Remains on CVVHD and aiming for -250 mL/hr.  Concerned he will end up HD-dependent.  - Nephrology input appreciated.   4. Acute Hypoxic Respiratory Failure: Suspect underlying OHS/OSA exacerbated by CHF this admission.  - Continue oxygen + CPAP. Increased 02 requirements, more comfortable today on 12 L HFNC. - remains tenuous   5. Type 2 DM: Farxiga held with AKI.   6. OSA: Has not been on Bipap at home.   7. Super Morbid Obesity: Body mass index is 59.41 kg/m.  8. UTI: Suspect by UA, sent urine culture.  ?Urinary obstruction related to this => no hydronephrosis on renal US.  - Ceftriaxone IV course completed.   9. Hyponatremia: Na 125 - on CVVHD, management per nephrology   10. Leukocytosis: WBC elevated but stable at 19K. AF - BCx NGTD  - Chest x-ray stable 08/07   GOC: -Arranging family meeting with Palliative Care as above. Palliative Care already following. -Currently Full Code.   Length of Stay: [place], [place], PA-C  03/02/2021, 7:03 AM  Advanced Heart Failure Team Pager [phone] (M-F; 7a - 5p)   Agree with above.   Remains on CVVHD. On NE 6 and DBA 7.5. Pulling -300 but he continues with high po intake so weight not dropping much. Remains SOB.  CVP 13 (checked personally). Co-ox 66%   General:  Sitting up in bed. Panting. Drinking fluids and asking for a cough drop "right now" HEENT: normal Neck: supple. RIJ HD cath Carotids 2+ bilat; no bruits. No lymphadenopathy or thryomegaly appreciated. Cor: PMI nondisplaced. Regular rate & rhythm. No rubs, gallops or murmurs. Lungs: clear decreased at bases Abdomen: obese soft, nontender, nondistended. No hepatosplenomegaly. No bruits or masses. Good bowel sounds. Extremities: no cyanosis, clubbing, rash, edema Neuro: alert & orientedx3, cranial nerves grossly intact. moves all 4 extremities w/o difficulty. Affect pleasant   Volume status getting much better. Attaining euvolemia has  been limited by high po intake. Remains on DBA and NE (failed NE wean) Will continue CVVHD one more day then try to hold. Long talk with him about the fact that kidneys may not recover  and that he may not be able to tolerate iHD. Will involve palliative care to help with family meeting with his parents to update them and determine GOC.   CRITICAL CARE Performed by: Glori Bickers  Total critical care time: 45 minutes  Critical care time was exclusive of separately billable procedures and treating other patients.  Critical care was necessary to treat or prevent imminent or life-threatening deterioration.  Critical care was time spent personally by me (independent of midlevel providers or residents) on the following activities: development of treatment plan with patient and/or surrogate as well as nursing, discussions with consultants, evaluation of patient's response to treatment, examination of patient, obtaining history from patient or surrogate, ordering and performing treatments and interventions, ordering and review of laboratory studies, ordering and review of radiographic studies, pulse oximetry and re-evaluation of patient's condition.  Glori Bickers, MD  9:41 AM

## 2021-03-02 NOTE — Progress Notes (Signed)
Patient ID: Phillip Young, male   DOB: 12/09/1985, 35 y.o.   MRN: [id number] [facility] Progress Note   Assessment/ Plan:   1. Acute kidney Injury: Secondary to cardiogenic shock/hemodynamically mediated in the setting of atrial flutter with RVR and underlying severe biventricular congestive heart failure.  As of 8/7 he had -49.5 L fluid balance since admission.  High PO intake has been a struggle  - Continue CRRT with net ultrafiltration of net neg 250 ml/hr - Will follow this AM's labs - Replacing phos - didn't get any yesterday - Note he is on 2K fluids - anticipate changing to 4K once AM labs available.  2.  Hyponatremia: Secondary to CHF/impaired free water handling with poorly restricted free water intake-fluid restriction was tightened to 1.2 L per day and appreciate nursing assistance   3.  Hyperkalemia: resolved with adjustment of CRRT prescription. Anticipate change to 4K  4.  Biventricular congestive heart failure: Continue CRRT for volume unloading after refractory to diuretics.  Unfortunately not a candidate for advanced therapies due to morbid obesity   5.  Atrial flutter with RVR: He underwent successful cardioversion, and is on amiodarone and Eliquis  6. Anemia normocytic - mild  Subjective:   He has been on UF goal of 300 ml/hr after increased shortness of breath on 8/7.  He had 150 ml UOP over 8/7 charted.  He had 7.3 liters UF over 8/7 as currently charted.  Per nursing and patient he has done a lot better with fluid restriction.  he has been on dobutamine and levophed at 6 mcg/min.  They have had issues getting labs  Review of systems:  Reports short of breath but much improved from yesterday  Nausea with emesis yesterday - no nausea now No chest pain    Objective:   BP (!) 92/51 (BP Location: Left Wrist)   Pulse 94   Temp 97.9 F (36.6 C) (Oral)   Resp (!) 35   Ht '5\' 11"'$  (1.803 m)   Wt (!) 192.7 kg   SpO2 93%   BMI 59.25 kg/m   Intake/Output  Summary (Last 24 hours) at 03/02/2021 0536 Last data filed at 03/02/2021 0400 Gross per 24 hour  Intake 3132.6 ml  Output 8214 ml  Net -5081.4 ml   Weight change:   Physical Exam:  General adult male in bed in no acute distress on CPAP  HEENT normocephalic atraumatic extraocular movements intact sclera anicteric Neck supple trachea midline Lungs reduced to auscultation bilaterally normal work of breathing at rest Heart S1S2 no rub Abdomen soft nontender obese habitus  Extremities no to trace pitting edema lower extremities Psych normal mood and affect Access - RIJ nontunneled dialysis catheter   Imaging: DG CHEST PORT 1 VIEW  Result Date: 03/01/2021 CLINICAL DATA:  Shortness of breath.  CHF. EXAM: PORTABLE CHEST 1 VIEW COMPARISON:  02/23/2021 FINDINGS: Patient has RIGHT IJ central line, tip overlying the UPPER superior vena cava. Stable marked cardiomegaly. Pulmonary vascular congestion appears stable. No focal consolidations or pleural effusions. IMPRESSION: Stable cardiomegaly and vascular congestion. Electronically Signed   By: Nolon Nations M.D.   On: 03/01/2021 16:38    Labs: BMET Recent Labs  Lab 02/27/21 0500 02/27/21 0645 02/27/21 1744 02/28/21 0306 02/28/21 1600 03/01/21 0455 03/01/21 1808  NA 126* 127* 124* 122* 124* 127* 127*  K 5.8* 5.7* 4.9 4.3 4.0 3.7 3.8  CL 89* 93* 88* 88* 90* 91* 91*  CO2 25 20* '24 24 24 26 25  '$ GLUCOSE  114* 116* 108* 136* 127* 97 127*  BUN '15 17 19 '$ 21* 23* 20 22*  CREATININE 2.72* 2.79* 3.09* 3.16* 3.12* 2.66* 2.85*  CALCIUM 8.8* 9.0 8.7* 8.4* 8.7* 8.7* 8.3*  PHOS 3.9 3.6 2.8 2.2* 2.3* 2.1* 2.1*   CBC Recent Labs  Lab 02/27/21 0645 02/27/21 1744 02/28/21 0306 03/01/21 0455  WBC 16.9* 21.0* 19.4* 19.5*  HGB 11.9* 11.4* 10.8* 11.4*  HCT 36.6* 35.5* 33.5* 35.2*  MCV 91.0 92.2 92.5 92.4  PLT 258 270 260 197    Medications:     (feeding supplement) PROSource Plus  30 mL Oral TID BM   benzonatate  100 mg Oral BID    chlorhexidine  15 mL Mouth Rinse BID   Chlorhexidine Gluconate Cloth  6 each Topical Daily   fentaNYL (SUBLIMAZE) injection  100-200 mcg Intravenous Once   glycerin (Pediatric)  2 suppository Rectal Once   insulin aspart  0-5 Units Subcutaneous QHS   insulin aspart  0-9 Units Subcutaneous TID WC   mouth rinse  15 mL Mouth Rinse q12n4p   multivitamin  1 tablet Oral QHS   pantoprazole  40 mg Oral Daily   polyethylene glycol  17 g Oral BID   senna-docusate  2 tablet Oral BID   sodium chloride flush  10-40 mL Intracatheter Q12H   sodium chloride flush  3 mL Intravenous Q12H   Claudia Desanctis, MD 03/02/2021, 5:56 AM

## 2021-03-02 NOTE — TOC Progression Note (Addendum)
Transition of Care (TOC) - Progression Note  Heart Failure   Patient Details  Name: Phillip Young MRN: [id number] Date of Birth: Jan 13, 1986  Transition of Care [facility]) CM/SW [place], [place] Phone Number: 03/02/2021, 4:39 PM  Clinical Narrative:    CSW received a call from the [facility] about the patient needing FMLA paperwork for his employer. CSW spoke with [name] and his mother [name] and they will email the CSW the necessary paperwork needed for his employer. [name] reported that his employer needs the paperwork returned by 03/08/21. CSW is awaiting e-mail from [name].  CSW will continue to follow throughout discharge.  Expected Discharge Plan: Home/Self Care Barriers to Discharge: Continued Medical Work up  Expected Discharge Plan and Services Expected Discharge Plan: Home/Self Care In-house Referral: Clinical Social Work Discharge Planning Services: CM Consult, HF Clinic   Living arrangements for the past 2 months: Single Family Home                                       Social Determinants of Health (SDOH) Interventions Food Insecurity Interventions: Assist with [employer] Application Financial Strain Interventions: Other (Comment) (Referral to [facility]) Housing Interventions: Intervention Not Indicated Transportation Interventions: Intervention Not Indicated  Readmission Risk Interventions Readmission Risk Prevention Plan 10/16/2020 10/16/2020 10/14/2020  Transportation Screening Complete Complete Complete  PCP or Specialist Appt within 5-7 Days Complete Complete Complete  Home Care Screening Complete - -  Medication Review (RN CM) Referral to Pharmacy - -  Some recent data might be hidden   [name], MSW, [place] Heart Failure Social Worker

## 2021-03-02 NOTE — Progress Notes (Signed)
Patient seen on PM rounds.   Remains on CVVHD pulling - 250/hr. BP stable on NE and DBA.   Having trouble with HD cath coming loose.   PM electrolytes ok.   Long talk with him and his mother about the situation and fact that we will probably stop CVVHD tomorrow and wait for Renal recovery. If no Renal recovery then we will need to see if he can tolerate iHD without inotrope support. If not, would need Hospice. We discussed this frankly and she was very realistic and understood the situation.  While on round, I cleaned his HD cath site sterilely and placed several retention sutures to secure it in place. A fresh dressing was placed.  Additional CCT 40 mins.   Glori Bickers, MD  7:01 PM

## 2021-03-03 DIAGNOSIS — I5023 Acute on chronic systolic (congestive) heart failure: Secondary | ICD-10-CM | POA: Diagnosis not present

## 2021-03-03 LAB — RENAL FUNCTION PANEL
Albumin: 3.1 g/dL — ABNORMAL LOW (ref 3.5–5.0)
Albumin: 3 g/dL — ABNORMAL LOW (ref 3.5–5.0)
Anion gap: 16 — ABNORMAL HIGH (ref 5–15)
Anion gap: 16 — ABNORMAL HIGH (ref 5–15)
BUN: 22 mg/dL — ABNORMAL HIGH (ref 6–20)
BUN: 25 mg/dL — ABNORMAL HIGH (ref 6–20)
CO2: 20 mmol/L — ABNORMAL LOW (ref 22–32)
CO2: 19 mmol/L — ABNORMAL LOW (ref 22–32)
Calcium: 8.8 mg/dL — ABNORMAL LOW (ref 8.9–10.3)
Calcium: 9.2 mg/dL (ref 8.9–10.3)
Chloride: 90 mmol/L — ABNORMAL LOW (ref 98–111)
Chloride: 89 mmol/L — ABNORMAL LOW (ref 98–111)
Creatinine, Ser: 3.55 mg/dL — ABNORMAL HIGH (ref 0.61–1.24)
Creatinine, Ser: 4.03 mg/dL — ABNORMAL HIGH (ref 0.61–1.24)
GFR, Estimated: 22 mL/min — ABNORMAL LOW (ref >60)
GFR, Estimated: 19 mL/min — ABNORMAL LOW (ref >60)
Glucose, Bld: 99 mg/dL (ref 70–99)
Glucose, Bld: 163 mg/dL — ABNORMAL HIGH (ref 70–99)
Phosphorus: 3.2 mg/dL (ref 2.5–4.6)
Phosphorus: 2.9 mg/dL (ref 2.5–4.6)
Potassium: 4 mmol/L (ref 3.5–5.1)
Potassium: 4.4 mmol/L (ref 3.5–5.1)
Sodium: 126 mmol/L — ABNORMAL LOW (ref 135–145)
Sodium: 124 mmol/L — ABNORMAL LOW (ref 135–145)

## 2021-03-03 LAB — CULTURE, BLOOD (ROUTINE X 2)
Culture: NO GROWTH
Culture: NO GROWTH

## 2021-03-03 LAB — CBC
HCT: 38.5 % — ABNORMAL LOW (ref 39.0–52.0)
Hemoglobin: 12.6 g/dL — ABNORMAL LOW (ref 13.0–17.0)
MCH: 29.7 pg (ref 26.0–34.0)
MCHC: 32.7 g/dL (ref 30.0–36.0)
MCV: 90.8 fL (ref 80.0–100.0)
Platelets: 61 10*3/uL — ABNORMAL LOW (ref 150–400)
RBC: 4.24 MIL/uL (ref 4.22–5.81)
RDW: 16.4 % — ABNORMAL HIGH (ref 11.5–15.5)
WBC: 22.8 10*3/uL — ABNORMAL HIGH (ref 4.0–10.5)
nRBC: 0.8 % — ABNORMAL HIGH (ref 0.0–0.2)

## 2021-03-03 LAB — GLUCOSE, CAPILLARY
Glucose-Capillary: 158 mg/dL — ABNORMAL HIGH (ref 70–99)
Glucose-Capillary: 179 mg/dL — ABNORMAL HIGH (ref 70–99)
Glucose-Capillary: 191 mg/dL — ABNORMAL HIGH (ref 70–99)
Glucose-Capillary: 204 mg/dL — ABNORMAL HIGH (ref 70–99)

## 2021-03-03 LAB — HEPARIN LEVEL (UNFRACTIONATED): Heparin Unfractionated: 0.68 IU/mL (ref 0.30–0.70)

## 2021-03-03 LAB — MAGNESIUM: Magnesium: 2.7 mg/dL — ABNORMAL HIGH (ref 1.7–2.4)

## 2021-03-03 MED ORDER — OXYCODONE HCL 5 MG PO TABS
5.0000 mg | ORAL_TABLET | ORAL | Status: DC | PRN
  Administered 2021-03-03 – 2021-03-17 (×19): 5 mg via ORAL
  Filled 2021-03-03 (×19): qty 1

## 2021-03-03 NOTE — TOC CM/SW Note (Addendum)
346 pm HF TOC CM had pt sign his [employer] disability paperwork. Paperwork will by completed by attending HF Clinic RN and faxed to [facility]. Pt will be taken a copy of completed paperwork. HF TOC CM requested a call when paperwork has been completed. Jonnie Finner RN CCM, [facility] ED TOC CM [phone]   257 pm TOC CM received call from pt's [place], [place], # [phone], fax [phone]. States she can assist with dc planning, and auth. Pt has met his out of pocket deductible $5000. His copay for NIV, or CIR or SNF rehab should be $0. States his benefits are from Jan to Jan. Draco Malczewski RN CCM, [facility] ED TOC CM [phone]   238 pm HF TOC CM will follow up with FMLA paperwork to have completed for his employer. [place], [place] ED TOC CM [phone]

## 2021-03-03 NOTE — Progress Notes (Signed)
Pharmacy medication related note  Patient refused taking stress ulcer prophylaxis. Pantoprazole changed to famotidine 8/8 but patient also refused that stating it decreased his hunger. Orders discontinued.    Erin Hearing PharmD., BCPS Clinical Pharmacist 03/03/2021 8:13 AM

## 2021-03-03 NOTE — Progress Notes (Signed)
[name] CRRT filter clotted. The decision was made not to restart CRRT due to it being turned off tomorrow.

## 2021-03-03 NOTE — Progress Notes (Signed)
Pt continuously educated by this RN about fluid restriction throughout night. RN has kept pt aware of exactly how much is left on his restriction for remainder of day. Pt states "I just won't drink anything during the day". Pt reminded that he has meds to take during the day along with meals. Pt unconcerned, continues to request fluids.  Pt has had 954cc since midnight. Made aware that the remaining 246cc of his restriction will be reserved for day shift needs d/t meds. Pt verbalizes understanding of this plan.   4:27 AM 03/03/21

## 2021-03-03 NOTE — Progress Notes (Addendum)
Patient ID: Phillip Young, male   DOB: 1986-01-31, 35 y.o.   MRN: [id number]     Advanced Heart Failure Rounding Note  PCP-Cardiologist: Dr. [name]  Subjective:    Underwent DCCV 8/5 with successful conversion to NSR. Remains in NSR  Remains on DBA 7.5 and NE 6. Co-ox 66%  On CVVHD, - 5.9L last 24 hrs. Weight stable. CVP 13.  Echo: EF < 20%, severe LV dilation, RV not well visualized.   Increased 02 requirements, now on 15L HFNC. Denies any dyspnea. Unable to wean 02. Fluid restriction remains an issue.  Unable to get co-ox today, no return on pigtail. CVP difficult.    Objective:   Weight Range: (!) 189.6 kg Body mass index is 58.3 kg/m.   Vital Signs:   Temp:  [98.2 F (36.8 C)-99.2 F (37.3 C)] 98.2 F (36.8 C) (08/09 0640) Pulse Rate:  [29-96] 87 (08/09 0640) Resp:  [21-41] 25 (08/09 0640) BP: (99-150)/(50-83) 103/63 (08/09 0400) SpO2:  [84 %-99 %] 96 % (08/09 0640) Weight:  [189.6 kg] 189.6 kg (08/09 0600) Last BM Date: 03/01/21  Weight change: Filed Weights   03/01/21 1000 03/02/21 0500 03/03/21 0600  Weight: (!) 192.7 kg (!) 193.2 kg (!) 189.6 kg    Intake/Output:   Intake/Output Summary (Last 24 hours) at 03/03/2021 0659 Last data filed at 03/03/2021 0600 Gross per 24 hour  Intake 4301.56 ml  Output 10343 ml  Net -6041.44 ml      Physical Exam   Obese male sitting up. On 15L HFNC HEENT: normal Neck: supple. + HD cath Carotids 2+ bilat; no bruits. No lymphadenopathy or thryomegaly appreciated. Cor: PMI nondisplaced. Regular rate & rhythm.Distant HS Lungs: Diminished throughout Abdomen: markedly obese soft, nontender, nondistended. No hepatosplenomegaly. No bruits or masses. Good bowel sounds. Extremities: no cyanosis, clubbing, rash, 2 + edema Neuro: alert & orientedx3, cranial nerves grossly intact. moves all 4 extremities w/o difficulty. Affect pleasant  Telemetry    NSR, 90s Personally reviewed  Labs    CBC Recent Labs    03/02/21 0615  03/03/21 0031  WBC 18.8* 22.8*  HGB 12.0* 12.6*  HCT 36.9* 38.5*  MCV 91.3 90.8  PLT 108* 61*   Basic Metabolic Panel Recent Labs    03/02/21 0615 03/02/21 1552 03/03/21 0031  NA 125* 128* 126*  K 4.1 4.2 4.0  CL 91* 93* 90*  CO2 23 21* 20*  GLUCOSE 108* 113* 99  BUN 21* 22* 22*  CREATININE 3.40* 3.47* 3.55*  CALCIUM 8.5* 8.7* 8.8*  MG 2.7*  --  2.7*  PHOS 2.2* 3.2 3.2   Liver Function Tests Recent Labs    03/02/21 1552 03/03/21 0031  ALBUMIN 2.9* 3.1*    No results for input(s): LIPASE, AMYLASE in the last 72 hours. Cardiac Enzymes No results for input(s): CKTOTAL, CKMB, CKMBINDEX, TROPONINI in the last 72 hours.  BNP: BNP (last 3 results) Recent Labs    10/10/20 1336 11/07/20 0932 02/11/21 1500  BNP 126.9* 106.0* 194.3*    ProBNP (last 3 results) No results for input(s): PROBNP in the last 8760 hours.   D-Dimer No results for input(s): DDIMER in the last 72 hours.  Hemoglobin A1C No results for input(s): HGBA1C in the last 72 hours.  Fasting Lipid Panel No results for input(s): CHOL, HDL, LDLCALC, TRIG, CHOLHDL, LDLDIRECT in the last 72 hours. Thyroid Function Tests No results for input(s): TSH, T4TOTAL, T3FREE, THYROIDAB in the last 72 hours.  Invalid input(s): FREET3   Other results:  Imaging    No results found.   Medications:     Scheduled Medications:  Chlorhexidine Gluconate Cloth  6 each Topical Daily   insulin aspart  0-5 Units Subcutaneous QHS   insulin aspart  0-9 Units Subcutaneous TID WC   mouth rinse  15 mL Mouth Rinse BID   multivitamin  1 tablet Oral QHS   senna-docusate  2 tablet Oral BID   sodium chloride flush  10-40 mL Intracatheter Q12H   sodium chloride flush  3 mL Intravenous Q12H    Infusions:   prismasol BGK 4/2.5 500 mL/hr at 03/02/21 2058    prismasol BGK 4/2.5 400 mL/hr at 03/03/21 0002   sodium chloride Stopped (02/27/21 1651)   amiodarone 30 mg/hr (03/03/21 0600)   DOBUTamine 7.5 mcg/kg/min  (03/03/21 0600)   heparin 10,000 units/ 20 mL infusion syringe 600 Units/hr (03/02/21 2237)   heparin 3,300 Units/hr (03/03/21 0600)   norepinephrine (LEVOPHED) Adult infusion 6 mcg/min (03/03/21 0600)   prismasol BGK 4/2.5 2,200 mL/hr at 03/03/21 0318    PRN Medications: sodium chloride, acetaminophen **OR** acetaminophen, alum & mag hydroxide-simeth, diphenhydrAMINE, guaiFENesin-dextromethorphan, heparin, heparin, menthol-cetylpyridinium, ondansetron (ZOFRAN) IV, oxyCODONE, sodium chloride, sodium chloride, sodium chloride flush, sodium chloride flush, sorbitol  Assessment/Plan   1. Acute on Chronic Biventricular Heart Failure -> cardiogenic shock:  Known NICM since 2019, previously followed by [facility]  Echo 8/21 EF < 20%, moderate LV dilation, severely decreased RV function, severe biatrial enlargement, mild MR. No history of ETOH/drugs.  No FH of cardiomyopathy (mother with [place]).  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  He is too large for cardiac MRI. RHC/LHC on 03/04/20 with no CAD low CI at 2.1. Evaluated by EP but not a candidate for ICD given BMI.  Echo this admission with EF < 20%, RV poorly visualized. He now has CHF exacerbation/shock likely in setting of AFL with RVR.  He was hypotensive with poor UOP, concern for cardiogenic shock.  Dobutamine therefore started at 2.5 and increased to 7.5.  Failed NE wean, added back 08/07.  Co-ox 42% > 55% > 66%. RN unable draw co-ox on HD cath - With progressive renal failure, CVVH started.  Pulling 250 mL/hr, -5.9 L last 24 hr. Switched to 150 ml/hr this am per Nephrology. Unable to get CVP. - Fluid restriction has been an issue  - Now in NSR after successful DCCV on 02/27/2021.  - With AKI/hypotension, stopped Entresto, dapagliflozin, spironolactone, digoxin.  - Will attempt to trial off CRRT.  Long talk with him and his mother yesterday evening about the fact that kidneys may not recover and  that he may not be able to tolerate iHD. If no renal recovery, then would need to be able to tolerate iHD without inotrope support. If not, would need Hospice. - With marked obesity, he is not a candidate for advanced therapies.   2. Aflutter w/ RVR:  Noted to be in Afib during a prior admission in 3/22 in the setting of CAP and a/c CHF, but converted to NSR on amiodarone. Discharged home w/ eliquis. Was in 2:1 AFL this admit in setting of a/c CHF w/ marked volume overload, + recent gastroenteritis w/ diarrhea. Has OSA but not using CPAP. TSH WNL. Admits to intermittent compliance w/ Eliquis. Has missed several doses in the last month. Now in NSR s/p DCCV on 8/5. - c/w amio drip at 30 mg per hour.  - Continue Eliquis 5 mg bid.   3. AKI: Creatinine  peaked to 3.40.Concern for cardiorenal syndrome.  Renal US with no hydronephrosis. Remains on CVVHD and aiming for -150 mL/hr.   - Concerned he will end up HD-dependent but would need to be able to tolerate without inotrope. - Nephrology input appreciated.   4. Acute Hypoxic Respiratory Failure: Suspect underlying OHS/OSA exacerbated by CHF this admission.  - Continue oxygen + CPAP. Increased 02 requirements, on 15 L HFNC. - remains tenuous   5. Type 2 DM: Farxiga held with AKI.   6. OSA: Has not been on Bipap at home.   7. Super Morbid Obesity: Body mass index is 58.3 kg/m.  8. UTI: Suspect by UA, sent urine culture.  ?Urinary obstruction related to this => no hydronephrosis on renal US.  - Ceftriaxone IV course completed.   9. Hyponatremia: Na 126 - on CVVHD, management per nephrology   10. Leukocytosis: WBC elevated to 23k today AF - BCx NGTD  - Chest x-ray stable 08/07   GOC: -Palliative Care following -Currently Full Code. -Had discussion with patient and his mother yesterday evening. Once CVVHD stopped await renal recovery. If no renal recovery, would need to see if he can tolerate iHD without inotrope. If not, Hospice would be his  only option.  -Not a candidate for advanced therapies.  Length of Stay: [place], [place], PA-C  03/03/2021, 6:59 AM  Advanced Heart Failure Team Pager [phone] (M-F; 7a - 5p)   Agree with above.   Remains on NE and milrinone. Unable to draw co-ox or get CVP. (CVP was 13 yesterday) Still on CVVHD. Pulling-250. Weight down another 8 pounds. Now 73 pounds total. Breathing improved but still SOB.  Multiple difficult interactions with staff regarding his going over his fluid restriction and other parts of his care  General:  Obese male sitting up on side of bed, SOB at times. HEENT: normal Neck: supple. RIJ HD cath Carotids 2+ bilat; no bruits. No lymphadenopathy or thryomegaly appreciated. Cor: PMI nondisplaced. Regular rate & rhythm. No rubs, gallops or murmurs. Lungs: clear Abdomen: obese soft, nontender, nondistended. No hepatosplenomegaly. No bruits or masses. Good bowel sounds. Extremities: no cyanosis, clubbing, rash, no edema Neuro: alert & orientedx3, cranial nerves grossly intact. moves all 4 extremities w/o difficulty. Affect pleasant  He is down 73 pounds. I think he is about as dry as we can get him. Will try to set up CVP again today. I ultrasounded his IJ yesterday and completely collapses during inspiration suggesting fluid status low.   I will d/w Renal regarding timing of stopping CVVHD. As above, would not restart CVVHD. If no renal recovery will need to try to switch him to [facility] and continue to wean inotropes. If unable to get to [facility] will need Hospice.   He is addicted to food/drinking constantly. It has proven to be essentially useless to argue with him about fluid restriction. I just explained to him that his kidneys need time to recover. If he puts back the fluid too quickly he will have less time for Renal recovery and may expedite the need for iHD, and ultimately the decision about the need for Hospice, etc.   CRITICAL CARE Performed by: Glori Bickers  Total critical care time: 45 minutes  Critical care time was exclusive of separately billable procedures and treating other patients.  Critical care was necessary to treat or prevent imminent or life-threatening deterioration.  Critical care was time spent personally by me (independent of midlevel providers or residents) on the following activities:  development of treatment plan with patient and/or surrogate as well as nursing, discussions with consultants, evaluation of patient's response to treatment, examination of patient, obtaining history from patient or surrogate, ordering and performing treatments and interventions, ordering and review of laboratory studies, ordering and review of radiographic studies, pulse oximetry and re-evaluation of patient's condition.  Glori Bickers, MD  8:11 AM

## 2021-03-03 NOTE — Progress Notes (Signed)
Pt asked for more fluids, RN reiterated pt is at limit for shift, as verbally agreed upon earlier (since pt only has 246cc left until 2359 tonight). Pt then asked for peach cups. This RN gave him said peaches after draining the excess fluid from the cup. Pt became acutely upset when told the excess fluid was removed. Became verbally aggressive with this RN and threatened to pull trialysis catheter out "and then you'll have to clean that mess up". I asked the charge nurse Xyan to talk with the patient, which he did for over an hour, explaining the necessity of the fluid restriction and even demonstrating how the excess fluid was 60cc per cup and would need to be documented as intake. Nephrology MD came in during morning rounds and agreed with primary RN and CN assessment and plan to maintain fluid restriction. Agreed with plan to save remaining 246cc to be received on day shift, as pt will need something to take meds with and any amount of water and ice counts as intake.

## 2021-03-03 NOTE — Progress Notes (Addendum)
Pt requested oxygen be titrated down. Explained that I cannot do that at this time since his O2 has been in the low-mid 90s and he has had dyspnea+tachypnea all night. Said I could titrate down if his O2sat improves. Explained how the O2 is supporting his heart function. Pt pulled prongs out of nose so that they blow into his mouth despite this education.

## 2021-03-03 NOTE — Progress Notes (Addendum)
Patient ID: Phillip Young, male   DOB: [birth year], 35 y.o.   MRN: [id number] [facility] Progress Note   Assessment/ Plan:   1. Acute kidney Injury: Secondary to cardiogenic shock/hemodynamically mediated in the setting of atrial flutter with RVR and underlying severe biventricular congestive heart failure.  As of 8/7 he had -49.5 L fluid balance since admission.  High PO intake has been a struggle  - Continue CRRT with net ultrafiltration of net neg 150 ml/hr - Continue 4K fluids - Will discuss with CHF re: timing of a trial of transition off of CRRT - reduce UF goal as above and reassess tomorrow. I am concerned about his ability to tolerate a transition off CRRT today  2.  Hyponatremia: Secondary to CHF/impaired free water handling with poorly restricted free water intake-fluid restriction was tightened to 1.2 L per day and appreciate nursing assistance. Large amount of UF per day as well   3.  Hyperkalemia: resolved with adjustment of CRRT prescription. Now tolerating 4K fluids  4.  Biventricular congestive heart failure: Continue CRRT for volume unloading after refractory to diuretics.  Unfortunately not a candidate for advanced therapies due to morbid obesity   5.  Atrial flutter with RVR: He underwent successful cardioversion and is on amiodarone   6. Anemia normocytic - mild  Subjective:   Difficulty with fluid restriction per charting.  He has requested excessive fluids overnight per RN - stating that he just wouldn't drink during the day; RN charted that remainder of fluid intake would be for meds.  He had 9.2 liters UF over 8/8 charted despite a decrease in UF goal to net neg 250 cc/hr.  No UOP is charted for 8/8.  Per note, Dr. Haroldine Laws applied suture to HD catheter and discussed a plan to possibly stop CRRT today and assess for either renal recovery vs. Transition to iHD and if did not tolerate then would transition to hospice.  He has been on levo at 6  mcg/min.  Review of systems:   Reports short of breath but states is improved from yesterday  Nausea with emesis yesterday - no nausea now No chest pain    Objective:   BP 103/63 (BP Location: Right Leg)   Pulse 86   Temp 99.2 F (37.3 C) (Axillary)   Resp (!) 38   Ht '5\' 11"'$  (1.803 m)   Wt (!) 193.2 kg   SpO2 94%   BMI 59.41 kg/m   Intake/Output Summary (Last 24 hours) at 03/03/2021 0540 Last data filed at 03/03/2021 0403 Gross per 24 hour  Intake 4222.29 ml  Output 9466 ml  Net -5243.71 ml   Weight change:   Physical Exam:  General adult male in bed in no acute distress on 11 liters oxygen HEENT normocephalic atraumatic extraocular movements intact sclera anicteric Neck supple trachea midline Lungs clear but reduced to auscultation bilaterally normal work of breathing at rest Heart S1S2 no rub Abdomen soft nontender obese habitus  Extremities trace to 1+ pitting edema lower extremities Psych normal mood and affect Access - RIJ nontunneled dialysis catheter   Imaging: DG CHEST PORT 1 VIEW  Result Date: 03/01/2021 CLINICAL DATA:  Shortness of breath.  CHF. EXAM: PORTABLE CHEST 1 VIEW COMPARISON:  02/23/2021 FINDINGS: Patient has RIGHT IJ central line, tip overlying the UPPER superior vena cava. Stable marked cardiomegaly. Pulmonary vascular congestion appears stable. No focal consolidations or pleural effusions. IMPRESSION: Stable cardiomegaly and vascular congestion. Electronically Signed   By: Nolon Nations M.D.  On: 03/01/2021 16:38    Labs: BMET Recent Labs  Lab 02/28/21 0306 02/28/21 1600 03/01/21 0455 03/01/21 1808 03/02/21 0615 03/02/21 1552 03/03/21 0031  NA 122* 124* 127* 127* 125* 128* 126*  K 4.3 4.0 3.7 3.8 4.1 4.2 4.0  CL 88* 90* 91* 91* 91* 93* 90*  CO2 '24 24 26 25 23 '$ 21* 20*  GLUCOSE 136* 127* 97 127* 108* 113* 99  BUN 21* 23* 20 22* 21* 22* 22*  CREATININE 3.16* 3.12* 2.66* 2.85* 3.40* 3.47* 3.55*  CALCIUM 8.4* 8.7* 8.7* 8.3* 8.5* 8.7* 8.8*   PHOS 2.2* 2.3* 2.1* 2.1* 2.2* 3.2 3.2   CBC Recent Labs  Lab 02/28/21 0306 03/01/21 0455 03/02/21 0615 03/03/21 0031  WBC 19.4* 19.5* 18.8* 22.8*  HGB 10.8* 11.4* 12.0* 12.6*  HCT 33.5* 35.2* 36.9* 38.5*  MCV 92.5 92.4 91.3 90.8  PLT 260 197 108* 61*    Medications:     Chlorhexidine Gluconate Cloth  6 each Topical Daily   insulin aspart  0-5 Units Subcutaneous QHS   insulin aspart  0-9 Units Subcutaneous TID WC   mouth rinse  15 mL Mouth Rinse BID   multivitamin  1 tablet Oral QHS   senna-docusate  2 tablet Oral BID   sodium chloride flush  10-40 mL Intracatheter Q12H   sodium chloride flush  3 mL Intravenous Q12H   Claudia Desanctis, MD 03/03/2021, 5:58 AM

## 2021-03-03 NOTE — Progress Notes (Signed)
ANTICOAGULATION CONSULT NOTE - Follow Up Consult  Pharmacy Consult for heparin Indication: atrial fibrillation  Labs: Recent Labs    03/01/21 0455 03/01/21 1808 03/01/21 2238 03/02/21 0615 03/02/21 1552 03/03/21 0031  HGB 11.4*  --   --  12.0*  --  12.6*  HCT 35.2*  --   --  36.9*  --  38.5*  PLT 197  --   --  108*  --  61*  HEPARINUNFRC 0.23*  --  0.26* 0.44  --  0.68  CREATININE 2.66*   < >  --  3.40* 3.47* 3.55*   < > = values in this interval not displayed.     Assessment: 35yo male remains therapeutic but now at upper end of goal on heparin; no infusion issues or signs of bleeding per RN.  Goal of Therapy:  Heparin level 0.3-0.7 units/ml   Plan:  Will reduce systemic heparin infusion to 3200 units/hr Check daily heparin level  Erin Hearing PharmD., BCPS Clinical Pharmacist 03/03/2021 7:19 AM

## 2021-03-03 NOTE — Progress Notes (Signed)
RT note. Patient placed self on cpap tonight, patient sat 94% and resting comfortable, RT will continue to monitor

## 2021-03-03 NOTE — TOC Progression Note (Addendum)
Transition of Care (TOC) - Progression Note  Heart Failure   Patient Details  Name: Phillip Young MRN: [id number] Date of Birth: 12-Jul-1986  Transition of Care [facility]) CM/SW [place], [place] Phone Number: 03/03/2021, 2:39 PM  Clinical Narrative:    CSW spoke with the patient at bedside to follow up from the conversation yesterday regarding his employer, FMLA paperwork and his insurance [employer] for short term disability. CSW informed Mr. Billey that no email communication was received and Mr. Wenner sent the CSW an email while the CSW was in the patients room. Mr. Broderson and the attending MD will need to fill out the paperwork and then the CSW/RNCM will get it back to Mr. Paganini' employer.  CSW will continue to follow throughout discharge.   Expected Discharge Plan: Home/Self Care Barriers to Discharge: Continued Medical Work up  Expected Discharge Plan and Services Expected Discharge Plan: Home/Self Care In-house Referral: Clinical Social Work Discharge Planning Services: CM Consult, HF Clinic   Living arrangements for the past 2 months: Single Family Home                                       Social Determinants of Health (SDOH) Interventions Food Insecurity Interventions: Assist with [employer] Application Financial Strain Interventions: Other (Comment) (Referral to [facility]) Housing Interventions: Intervention Not Indicated Transportation Interventions: Intervention Not Indicated  Readmission Risk Interventions Readmission Risk Prevention Plan 10/16/2020 10/16/2020 10/14/2020  Transportation Screening Complete Complete Complete  PCP or Specialist Appt within 5-7 Days Complete Complete Complete  Home Care Screening Complete - -  Medication Review (RN CM) Referral to Pharmacy - -  Some recent data might be hidden   Alexios Keown, MSW, [place] Heart Failure Social Worker

## 2021-03-03 NOTE — Progress Notes (Signed)
Unable to draw AM coox d/t no blood return on trialysis pigtail. Unable to TPA line d/t pressors going through it without alternative central access. Will notify day team during rounds.

## 2021-03-03 NOTE — Plan of Care (Signed)
  Problem: Education: Goal: Knowledge of General Education information will improve Description: Including pain rating scale, medication(s)/side effects and non-pharmacologic comfort measures Outcome: Progressing   Problem: Health Behavior/Discharge Planning: Goal: Ability to manage health-related needs will improve Outcome: Progressing   Problem: Clinical Measurements: Goal: Ability to maintain clinical measurements within normal limits will improve Outcome: Progressing Goal: Will remain free from infection Outcome: Progressing Goal: Diagnostic test results will improve Outcome: Progressing Goal: Respiratory complications will improve Outcome: Progressing Goal: Cardiovascular complication will be avoided Outcome: Progressing   Problem: Activity: Goal: Risk for activity intolerance will decrease Outcome: Progressing   Problem: Coping: Goal: Level of anxiety will decrease Outcome: Progressing   Problem: Elimination: Goal: Will not experience complications related to bowel motility Outcome: Progressing Goal: Will not experience complications related to urinary retention Outcome: Progressing   Problem: Pain Managment: Goal: General experience of comfort will improve Outcome: Progressing   Problem: Safety: Goal: Ability to remain free from injury will improve Outcome: Progressing   Problem: Skin Integrity: Goal: Risk for impaired skin integrity will decrease Outcome: Progressing   Problem: Education: Goal: Knowledge of disease and its progression will improve Outcome: Progressing   Problem: Health Behavior/Discharge Planning: Goal: Ability to manage health-related needs will improve Outcome: Progressing   Problem: Clinical Measurements: Goal: Complications related to the disease process or treatment will be avoided or minimized Outcome: Progressing Goal: Dialysis access will remain free of complications Outcome: Progressing   Problem: Activity: Goal: Activity  intolerance will improve Outcome: Progressing   Problem: Fluid Volume: Goal: Fluid volume balance will be maintained or improved Outcome: Progressing   Problem: Nutritional: Goal: Ability to make appropriate dietary choices will improve Outcome: Progressing   Problem: Respiratory: Goal: Respiratory symptoms related to disease process will be avoided Outcome: Progressing   Problem: Self-Concept: Goal: Body image disturbance will be avoided or minimized Outcome: Progressing   Problem: Urinary Elimination: Goal: Progression of disease will be identified and treated Outcome: Progressing

## 2021-03-03 NOTE — Progress Notes (Signed)
PT Cancellation Note  Patient Details Name: Phillip Young MRN: [id number] DOB: 01-12-1986   Cancelled Treatment:    Reason Eval/Treat Not Completed: Patient declined, no reason specified.  Pt has refused any intervention while on CRRT.  Await possible participate IF pt gets on CRRT. 03/03/2021  Ginger Carne., PT Acute Rehabilitation Services [phone]  (pager) [phone]  (office)   Tessie Fass Tiegan Terpstra 03/03/2021, 12:12 PM

## 2021-03-04 DIAGNOSIS — I5023 Acute on chronic systolic (congestive) heart failure: Secondary | ICD-10-CM | POA: Diagnosis not present

## 2021-03-04 LAB — CBC
HCT: 36.1 % — ABNORMAL LOW (ref 39.0–52.0)
Hemoglobin: 11.5 g/dL — ABNORMAL LOW (ref 13.0–17.0)
MCH: 29 pg (ref 26.0–34.0)
MCHC: 31.9 g/dL (ref 30.0–36.0)
MCV: 90.9 fL (ref 80.0–100.0)
Platelets: 52 10*3/uL — ABNORMAL LOW (ref 150–400)
RBC: 3.97 MIL/uL — ABNORMAL LOW (ref 4.22–5.81)
RDW: 16.9 % — ABNORMAL HIGH (ref 11.5–15.5)
WBC: 23.5 10*3/uL — ABNORMAL HIGH (ref 4.0–10.5)
nRBC: 0.3 % — ABNORMAL HIGH (ref 0.0–0.2)

## 2021-03-04 LAB — GLUCOSE, CAPILLARY
Glucose-Capillary: 109 mg/dL — ABNORMAL HIGH (ref 70–99)
Glucose-Capillary: 99 mg/dL (ref 70–99)
Glucose-Capillary: 86 mg/dL (ref 70–99)
Glucose-Capillary: 117 mg/dL — ABNORMAL HIGH (ref 70–99)

## 2021-03-04 LAB — BASIC METABOLIC PANEL
Anion gap: 15 (ref 5–15)
BUN: 41 mg/dL — ABNORMAL HIGH (ref 6–20)
CO2: 18 mmol/L — ABNORMAL LOW (ref 22–32)
Calcium: 9.1 mg/dL (ref 8.9–10.3)
Chloride: 87 mmol/L — ABNORMAL LOW (ref 98–111)
Creatinine, Ser: 6.39 mg/dL — ABNORMAL HIGH (ref 0.61–1.24)
GFR, Estimated: 11 mL/min — ABNORMAL LOW (ref >60)
Glucose, Bld: 98 mg/dL (ref 70–99)
Potassium: 4.5 mmol/L (ref 3.5–5.1)
Sodium: 120 mmol/L — ABNORMAL LOW (ref 135–145)

## 2021-03-04 LAB — COOXEMETRY PANEL
Carboxyhemoglobin: 0.9 % (ref 0.5–1.5)
Carboxyhemoglobin: 1.2 % (ref 0.5–1.5)
Methemoglobin: 0.6 % (ref 0.0–1.5)
Methemoglobin: 0.7 % (ref 0.0–1.5)
O2 Saturation: 35.4 %
O2 Saturation: 66.8 %
Total hemoglobin: 12.3 g/dL (ref 12.0–16.0)
Total hemoglobin: 12 g/dL (ref 12.0–16.0)

## 2021-03-04 LAB — RENAL FUNCTION PANEL
Albumin: 2.8 g/dL — ABNORMAL LOW (ref 3.5–5.0)
Anion gap: 15 (ref 5–15)
BUN: 32 mg/dL — ABNORMAL HIGH (ref 6–20)
CO2: 18 mmol/L — ABNORMAL LOW (ref 22–32)
Calcium: 8.9 mg/dL (ref 8.9–10.3)
Chloride: 87 mmol/L — ABNORMAL LOW (ref 98–111)
Creatinine, Ser: 5.03 mg/dL — ABNORMAL HIGH (ref 0.61–1.24)
GFR, Estimated: 14 mL/min — ABNORMAL LOW (ref >60)
Glucose, Bld: 91 mg/dL (ref 70–99)
Phosphorus: 3.7 mg/dL (ref 2.5–4.6)
Potassium: 4.8 mmol/L (ref 3.5–5.1)
Sodium: 120 mmol/L — ABNORMAL LOW (ref 135–145)

## 2021-03-04 LAB — MAGNESIUM: Magnesium: 2.5 mg/dL — ABNORMAL HIGH (ref 1.7–2.4)

## 2021-03-04 LAB — HEPARIN LEVEL (UNFRACTIONATED): Heparin Unfractionated: 0.33 IU/mL (ref 0.30–0.70)

## 2021-03-04 MED ORDER — FUROSEMIDE 10 MG/ML IJ SOLN: 80.0000 mg | Freq: Once | INTRAMUSCULAR | Status: DC

## 2021-03-04 MED ORDER — CHLORHEXIDINE GLUCONATE CLOTH 2 % EX PADS: 6.0000 | MEDICATED_PAD | Freq: Every day | CUTANEOUS | Status: DC

## 2021-03-04 MED ORDER — PROSOURCE PLUS PO LIQD
30.0000 mL | Freq: Three times a day (TID) | ORAL | Status: DC
  Administered 2021-03-04 (×2): 30 mL via ORAL
  Filled 2021-03-04 (×2): qty 30

## 2021-03-04 MED ORDER — FUROSEMIDE 10 MG/ML IJ SOLN
80.0000 mg | Freq: Two times a day (BID) | INTRAMUSCULAR | Status: DC
  Administered 2021-03-04 (×2): 80 mg via INTRAVENOUS
  Filled 2021-03-04 (×2): qty 8

## 2021-03-04 MED ORDER — AMIODARONE HCL 200 MG PO TABS
400.0000 mg | ORAL_TABLET | Freq: Two times a day (BID) | ORAL | Status: DC
  Administered 2021-03-04 – 2021-03-08 (×8): 400 mg via ORAL
  Filled 2021-03-04 (×8): qty 2

## 2021-03-04 NOTE — Progress Notes (Signed)
Physical Therapy Treatment Patient Details Name: Phillip Young MRN: [id number] DOB: 17-Dec-1985 Today's Date: 03/04/2021    History of Present Illness 34 y.o. year-old admitted 7/22 w/ SOB x 1 week. CHF exacerbation. Atrial w/ RVR may have been cause of decomp CHF per cardiology team.  ECHO showed EF <20%. BP's dropped and pt moved to ICU for suspected cardiogenic shock.CRRT started 7/24 and discontinued 8/10.  PMH:  severe LV/ RV heart failure, super morbid obesity, HTN,    PT Comments    Pt now off CRRT and makes good on his promise to work on goals and ambulate in the halls.  Emphasis on progressing gait stability/stamina with EVA walker with monitoring of SpO2 and HR.    Follow Up Recommendations  No PT follow up     Equipment Recommendations  None recommended by PT    Recommendations for Other Services       Precautions / Restrictions Precautions Precautions: None    Mobility  Bed Mobility Overal bed mobility: Modified Independent             General bed mobility comments: HOB flat, uses leg momentum to assist with raising torso    Transfers Overall transfer level: Independent Equipment used: None                Ambulation/Gait Ambulation/Gait assistance: Supervision Gait Distance (Feet): 300 Feet (total with 3 long 5 min standing rest breaks in the EVA walker) Assistive device:  (EVA) Gait Pattern/deviations: Step-through pattern   Gait velocity interpretation: 1.31 - 2.62 ft/sec, indicative of limited community ambulator General Gait Details: mildly unsteady, mildly weak gait in the EVA with some "waggle"  Sats while moving stayed in the 90's on 15L [place], but on several occasions standing , sats dropped down into the mid to lower 80's with steady recovery with cuing.  EHR stayed below 100 bpm.   Stairs             Wheelchair Mobility    Modified Rankin (Stroke Patients Only)       Balance Overall balance assessment: Needs assistance    Sitting balance-Leahy Scale: Fair (to good)       Standing balance-Leahy Scale: Good Standing balance comment: But prefers use of the EVA over standing without assist                            Cognition Arousal/Alertness: Awake/alert Behavior During Therapy: WFL for tasks assessed/performed Overall Cognitive Status: Within Functional Limits for tasks assessed                                        Exercises      General Comments        Pertinent Vitals/Pain Pain Assessment: No/denies pain    Home Living                      Prior Function            PT Goals (current goals can now be found in the care plan section) Acute Rehab PT Goals Patient Stated Goal: to get off the CRRT machine PT Goal Formulation: With patient Time For Goal Achievement: 03/14/21 Potential to Achieve Goals: Good Progress towards PT goals: Progressing toward goals    Frequency    Min 3X/week      PT Plan  Current plan remains appropriate    Co-evaluation              AM-PAC PT "6 Clicks" Mobility   Outcome Measure  Help needed turning from your back to your side while in a flat bed without using bedrails?: None Help needed moving from lying on your back to sitting on the side of a flat bed without using bedrails?: None Help needed moving to and from a bed to a chair (including a wheelchair)?: None Help needed standing up from a chair using your arms (e.g., wheelchair or bedside chair)?: None Help needed to walk in hospital room?: A Little Help needed climbing 3-5 steps with a railing? : A Little 6 Click Score: 22    End of Session Equipment Utilized During Treatment: Oxygen Activity Tolerance: Patient tolerated treatment well Patient left: with call bell/phone within reach;with nursing/sitter in room;in chair;with family/visitor present Nurse Communication: Mobility status PT Visit Diagnosis: Unsteadiness on feet (R26.81);Muscle  weakness (generalized) (M62.81);Difficulty in walking, not elsewhere classified (R26.2)     Time: [id number] PT Time Calculation (min) (ACUTE ONLY): 34 min  Charges:  $Gait Training: 8-22 mins $Therapeutic Activity: 8-22 mins                     03/04/2021  Ginger Carne., PT Acute Rehabilitation Services [phone]  (pager) [phone]  (office)   Tessie Fass Jaidyn Usery 03/04/2021, 5:47 PM

## 2021-03-04 NOTE — Progress Notes (Signed)
Inpatient Diabetes Program Recommendations  AACE/ADA: New Consensus Statement on Inpatient Glycemic Control (2015)  Target Ranges:  Prepandial:   less than 140 mg/dL      Peak postprandial:   less than 180 mg/dL (1-2 hours)      Critically ill patients:  140 - 180 mg/dL   Lab Results  Component Value Date   GLUCAP 109 (H) 03/04/2021   HGBA1C 6.5 (H) 02/11/2021    Review of Glycemic Control Results for Phillip Young, Phillip Young (MRN [id number]) as of 03/04/2021 09:43  Ref. Range 03/03/2021 06:40 03/03/2021 11:16 03/03/2021 16:10 03/03/2021 20:31 03/04/2021 06:17  Glucose-Capillary Latest Ref Range: 70 - 99 mg/dL 158 (H)  Novolog 2 units 179 (H)  Novolog 2 units 191 (H)  Novolog 2 units 204 (H)  Novolog 2 units 109 (H)   Diabetes history: DM 2 Outpatient Diabetes medications: farxiga 10 mg Daily Current orders for Inpatient glycemic control:  Novolog 0-9 units tid + hs  Inpatient Diabetes Program Recommendations:    - may consider Novolog 2 units tid meal coverage if eating >50% of meals  Thanks,  Tama Headings RN, MSN, BC-ADM Inpatient Diabetes Coordinator Team Pager [phone] (8a-5p)

## 2021-03-04 NOTE — Progress Notes (Signed)
Nutrition Follow-up  DOCUMENTATION CODES:   Morbid obesity  INTERVENTION:   Resume 30 ml ProSource Plus TID, each supplement provides 100 kcals and 15 grams protein.   Change to 2g sodium diet with 1200 mL fluid restriction  Double portion of protein at meals  Continue Renal MVI  NUTRITION DIAGNOSIS:   Increased nutrient needs related to acute illness as evidenced by estimated needs.  Being addressed via supplements  GOAL:   Patient will meet greater than or equal to 90% of their needs  Progressing  MONITOR:   PO intake, Supplement acceptance, Weight trends, Labs  REASON FOR ASSESSMENT:   Rounds (CRRT)    ASSESSMENT:   35 yo male admitted with acute respiratory failure with pulmonary edema with acute decompensated heart failure, cardiogenic shock. Pt requiring CRRT for volume removal. PMH includes biventricular heart failure, morbid obesity, HTN, GERD, OSA, pre-DM  Pt remains on dobutamine and levophed Off CRRT, noted possible iHD Palliative care is following  Fluid restriction lifted yesterday and pt gained 10 pounds in 24 hours. Fluid restriction reinstated today. Discussed with RN who indicates pt is dealing with fluid restriction much better today. He seems to understand better why he needs the fluid restriction; he is uncomfortable after gaining 10 pounds   Pt eating better, actually eating solid food. Pt ate eggs and toast at breakfast this AM. Per RN, pt did have an episode of emesis today but RN reports it appeared bilious  Labs: sodium 120 (L), potassium 4.5 (wdl), phosphorus 3.7 (wdl) Meds: lasix, ss novolog, Rena-Vite, senna-docusate    Diet Order:  RENAL diet with 1200 mL fluid restriction   EDUCATION NEEDS:   Education needs have been addressed  Skin:  Skin Assessment: Reviewed RN Assessment  Last BM:  8/7  Height:   Ht Readings from Last 1 Encounters:  03/03/21 '5\' 11"'$  (1.803 m)    Weight:   Wt Readings from Last 1 Encounters:   03/04/21 (!) 193.9 kg    BMI:  Body mass index is 59.62 kg/m.  Estimated Nutritional Needs:   Kcal:  2300-3500 kcals  Protein:  150-170 g  Fluid:  1.2 L  Kerman Passey MS, RDN, LDN, CNSC Registered Dietitian III Clinical Nutrition RD Pager and On-Call Pager Number Located in [place]

## 2021-03-04 NOTE — Progress Notes (Signed)
ANTICOAGULATION CONSULT NOTE - Follow Up Consult  Pharmacy Consult for heparin Indication: atrial fibrillation  Labs: Recent Labs    03/02/21 0615 03/02/21 1552 03/03/21 0031 03/03/21 1559 03/04/21 0112  HGB 12.0*  --  12.6*  --   --   HCT 36.9*  --  38.5*  --   --   PLT 108*  --  61*  --   --   HEPARINUNFRC 0.44  --  0.68  --  0.33  CREATININE 3.40*   < > 3.55* 4.03* 5.03*   < > = values in this interval not displayed.     Assessment: 35yo male on heparin drip for AFib now in SR on amiodarone  with heparin level 0.33 therapeutic at goal on heparin 3200 uts/hr, no infusion issues or signs of bleeding per RN, h/h stable pltc fell yesterday awaiting recheck today    Goal of Therapy:  Heparin level 0.3-0.7 units/ml   Plan:  Continue systemic heparin infusion to 3200 units/hr Check daily heparin level and CBC   Bonnita Nasuti Pharm.D. CPP, BCPS Clinical Pharmacist [phone] 03/04/2021 12:31 PM   03/04/2021 12:22 PM

## 2021-03-04 NOTE — Progress Notes (Signed)
Patient ID: Phillip Young, male   DOB: August 13, 1985, 35 y.o.   MRN: [id number]     Advanced Heart Failure Rounding Note  PCP-Cardiologist: Dr. [name]  Subjective:    CVVHD catheter clotted yesterday -- unable to resume CVVHD.  Per nephrology, if it cannot resume, will attempt iHD   Weight now up 10 pounds and net I/O is +3588.  Co-ox 35.4 with repeat pending. Question accuracy of CVP at 11   Oxygen requirement is 15L and he is short of breath this morning.  Platelet count down to 61 on 8/9.    This morning, he expresses remorse over his poor treatment of staff and over his noncompliance with his fluid restriction.  He says he needed to "binge" and now regrets the decision.     Objective:   Weight Range: (!) 193.9 kg Body mass index is 59.62 kg/m.   Vital Signs:   Temp:  [96.2 F (35.7 C)-98.8 F (37.1 C)] 97.6 F (36.4 C) (08/10 0630) Pulse Rate:  [76-92] 77 (08/10 0635) Resp:  [10-58] 21 (08/10 0635) BP: (77-146)/(43-103) 139/76 (08/10 0605) SpO2:  [88 %-100 %] 95 % (08/10 0635) Weight:  [193.9 kg] 193.9 kg (08/10 0500) Last BM Date: 03/01/21  Weight change: Filed Weights   03/02/21 0500 03/03/21 0600 03/04/21 0500  Weight: (!) 193.2 kg (!) 189.6 kg (!) 193.9 kg    Intake/Output:   Intake/Output Summary (Last 24 hours) at 03/04/2021 0704 Last data filed at 03/04/2021 0400 Gross per 24 hour  Intake 5628.42 ml  Output 2040 ml  Net 3588.42 ml       Physical Exam   Obese male sitting up. On 15L HFNC HEENT: normal Neck: supple. + HD cath Carotids 2+ bilat; no bruits. No lymphadenopathy or thryomegaly appreciated. Cor: PMI nondisplaced. Regular rate & rhythm.Distant HS Lungs: Diminished throughout Abdomen: markedly obese soft, nontender, nondistended. No hepatosplenomegaly. No bruits or masses. Good bowel sounds. Extremities: no cyanosis, clubbing, rash, 2 + edema Neuro: alert & orientedx3, cranial nerves grossly intact. moves all 4 extremities w/o difficulty.   Affect pleasant.  Telemetry    NSR in 80s Personally reviewed  Labs    CBC Recent Labs    03/02/21 0615 03/03/21 0031  WBC 18.8* 22.8*  HGB 12.0* 12.6*  HCT 36.9* 38.5*  MCV 91.3 90.8  PLT 108* 61*    Basic Metabolic Panel Recent Labs    03/03/21 0031 03/03/21 1559 03/04/21 0112  NA 126* 124* 120*  K 4.0 4.4 4.8  CL 90* 89* 87*  CO2 20* 19* 18*  GLUCOSE 99 163* 91  BUN 22* 25* 32*  CREATININE 3.55* 4.03* 5.03*  CALCIUM 8.8* 9.2 8.9  MG 2.7*  --  2.5*  PHOS 3.2 2.9 3.7    Liver Function Tests Recent Labs    03/03/21 1559 03/04/21 0112  ALBUMIN 3.0* 2.8*     No results for input(s): LIPASE, AMYLASE in the last 72 hours. Cardiac Enzymes No results for input(s): CKTOTAL, CKMB, CKMBINDEX, TROPONINI in the last 72 hours.  BNP: BNP (last 3 results) Recent Labs    10/10/20 1336 11/07/20 0932 02/11/21 1500  BNP 126.9* 106.0* 194.3*     ProBNP (last 3 results) No results for input(s): PROBNP in the last 8760 hours.   D-Dimer No results for input(s): DDIMER in the last 72 hours.  Hemoglobin A1C No results for input(s): HGBA1C in the last 72 hours.  Fasting Lipid Panel No results for input(s): CHOL, HDL, LDLCALC, TRIG, CHOLHDL, LDLDIRECT in  the last 72 hours. Thyroid Function Tests No results for input(s): TSH, T4TOTAL, T3FREE, THYROIDAB in the last 72 hours.  Invalid input(s): FREET3   Other results:   Imaging    No results found.   Medications:     Scheduled Medications:  Chlorhexidine Gluconate Cloth  6 each Topical Daily   furosemide  80 mg Intravenous BID   insulin aspart  0-5 Units Subcutaneous QHS   insulin aspart  0-9 Units Subcutaneous TID WC   mouth rinse  15 mL Mouth Rinse BID   multivitamin  1 tablet Oral QHS   senna-docusate  2 tablet Oral BID   sodium chloride flush  10-40 mL Intracatheter Q12H   sodium chloride flush  3 mL Intravenous Q12H    Infusions:  sodium chloride Stopped (02/27/21 1651)   amiodarone 30  mg/hr (03/04/21 0400)   DOBUTamine 7.5 mcg/kg/min (03/04/21 0400)   heparin 3,200 Units/hr (03/04/21 [id number])   norepinephrine (LEVOPHED) Adult infusion 6 mcg/min (03/04/21 0400)    PRN Medications: sodium chloride, acetaminophen **OR** acetaminophen, diphenhydrAMINE, guaiFENesin-dextromethorphan, menthol-cetylpyridinium, ondansetron (ZOFRAN) IV, oxyCODONE, sodium chloride, sodium chloride flush, sodium chloride flush, sorbitol  Assessment/Plan   1. Acute on Chronic Biventricular Heart Failure -> cardiogenic shock:  Known NICM since 2019, previously followed by [facility].  Echo 8/21 EF < 20%, moderate LV dilation, severely decreased RV function, severe biatrial enlargement, mild MR. No history of ETOH/drugs.  No FH of cardiomyopathy (mother with [place]).  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  He is too large for cardiac MRI. RHC/LHC on 03/04/20 with no CAD low CI at 2.1. Evaluated by EP but not a candidate for ICD given BMI.  Echo this admission with EF < 20%, RV poorly visualized. He now has CHF exacerbation/shock likely in setting of AFL with RVR.  He was hypotensive with poor UOP, concern for cardiogenic shock.  Dobutamine therefore started at 2.5 and increased to 7.5.  Failed NE wean, added back 08/07.  Co-ox 42% > 55% > 66%. RN unable draw co-ox on HD cath, but attempting again on 8/10  - With progressive renal failure, CVVHD started.  However, catheter clotted on 8/9 and unable resume at this time. - Will attempt to trial off CVVHD.  Long talk with him and his mother yesterday evening about the fact that kidneys may not recover and that he may not be able to tolerate iHD. If no renal recovery, then would need to be able to tolerate iHD without inotrope support. If not, would need hospice - Fluid restriction has been an issue  - After discontinuation of CVVHD, weight up 10 pounds with +3.5L fluid balance  - BID Lasix resumed by nephrology to start  8/10  - Now in NSR after successful DCCV on 02/27/2021.  - With AKI/hypotension, stopped Entresto, dapagliflozin, spironolactone, digoxin.  - With marked obesity, he is not a candidate for advanced therapies.   2. Aflutter w/ RVR:  Noted to be in Afib during a prior admission in 3/22 in the setting of CAP and a/c CHF, but converted to NSR on amiodarone. Discharged home w/ eliquis. Was in 2:1 AFL this admit in setting of a/c CHF w/ marked volume overload, + recent gastroenteritis w/ diarrhea. Has OSA but not using CPAP. TSH WNL. Admits to intermittent compliance w/ Eliquis. Has missed several doses in the last month. Now in NSR s/p DCCV on 8/5. - c/w amio drip at 30 mg per hour.  - Continue heparin drip for  now -- low plt count on 8/9 noted. HIT pending, repeat CBC pending  - Resume Eliquis if no other procedures planned   3. AKI: Creatinine now up to 5.Concern for cardiorenal syndrome.  Renal US with no hydronephrosis.   - CVVHD stopped on 8/9 after catheter clotted.  Per nephrology, will not resume CVVHD and will attempt iHD; however, may be limited by hypotension - BID Lasix resumed by nephrology on 8/10   4. Acute Hypoxic Respiratory Failure: Suspect underlying OHS/OSA exacerbated by CHF this admission.  - Continue oxygen + CPAP. Increased O2 requirements -- now on 15 L HFNC. - remains tenuous   5. Type 2 DM: Farxiga held with AKI.   6. OSA: Has not been on Bipap at home.   7. Super Morbid Obesity: Body mass index is 59.62 kg/m.  8. UTI: Suspect by UA, sent urine culture.  ?Urinary obstruction related to this => no hydronephrosis on renal US.  - Ceftriaxone IV course completed.   9. Hyponatremia: Na 120 - on CVVHD, management per nephrology  - Per nephrology, plan to recheck BMP today -- may need iHD today if able to arrange logistically   10. Leukocytosis: WBC elevated to 23, but afebrile - BCx NGTD  - Chest x-ray stable 08/07  11. Thrombocytopenia - Plt count down to 61 on 8/9   - HIT panel pending, but do not suspect it is HIT due to the length of time on heparin with stable plt count. CBC recheck pending   GOC: -Palliative care following -Currently full code. -Had discussion with patient and his mother yesterday evening. Once CVVHD stopped await renal recovery. If no renal recovery, would need to see if he can tolerate iHD without inotrope. If not, hospice would be his only option.  -Not a candidate for advanced therapies. - Now expressing remorse for his behavior toward staff and for his noncompliance with treatment regimen   Length of Stay: [place], NP  03/04/2021, 7:04 AM  Advanced Heart Failure Team Pager [phone] (M-F; 7a - 5p)   Agree with above.   CVVHD filter clotted yesterday. Did not restart. He said he had to "binge" yesterday to "get it out of his system". Very high food and fluid intake.  Says he feels ok this am but weight up 10 pounds. Initial co-os 35% but repeat was 67%. Remains on DBA 7.5 and NE 6. Anuric. SCr rising quickly. Na 120  General:  Sitting up in chair No resp difficulty HEENT: normal Neck: supple.RIJ HD cath Carotids 2+ bilat; no bruits. No lymphadenopathy or thryomegaly appreciated. Cor: PMI nondisplaced. Regular rate & rhythm. No rubs, gallops or murmurs. Lungs: clear Abdomen: obese soft, nontender, nondistended. No hepatosplenomegaly. No bruits or masses. Good bowel sounds. Extremities: no cyanosis, clubbing, rash, 1+ edema Neuro: alert & orientedx3, cranial nerves grossly intact. moves all 4 extremities w/o difficulty. Affect pleasant  Now off CVVHD. No evidence of renal recovery. Electrolytes and fluid status worse. Suspect he will need attempt at [facility] soon. If unable to tolerate iHD with pressor support then will need Hospice. If tolerates iHD with pressor support then will need to try to wean pressors slowly. He is aware of how tenuous his situation is.   CRITICAL CARE Performed by: Glori Bickers  Total critical care time: 45 minutes  Critical care time was exclusive of separately billable procedures and treating other patients.  Critical care was necessary to treat or prevent imminent or life-threatening deterioration.  Critical care  was time spent personally by me (independent of midlevel providers or residents) on the following activities: development of treatment plan with patient and/or surrogate as well as nursing, discussions with consultants, evaluation of patient's response to treatment, examination of patient, obtaining history from patient or surrogate, ordering and performing treatments and interventions, ordering and review of laboratory studies, ordering and review of radiographic studies, pulse oximetry and re-evaluation of patient's condition.  Glori Bickers, MD  5:24 PM

## 2021-03-04 NOTE — Progress Notes (Signed)
RT note.Patient able to place self on cpap for the night when ready for bed. CPAP all set up, RT will continue to monitor

## 2021-03-04 NOTE — Progress Notes (Addendum)
Patient ID: Phillip Young, male   DOB: [birth year], 35 y.o.   MRN: [id number] [facility] Progress Note   Assessment/ Plan:   1. Acute kidney Injury: Secondary to cardiogenic shock/hemodynamically mediated in the setting of atrial flutter with RVR and underlying severe biventricular congestive heart failure.  As of 8/9 he is down 73 lbs since admission.  High PO intake has been a struggle  - Spoke with HF team on 8/9 and plans for stopping CRRT on 8/10 and not restarting once it is stopped; if no renal recovery he would need to transition to iHD and if he is not able to tolerate he would need to transition to hospice - Remain off of CRRT  - Lasix 80 mg IV BID  - assess need for need for iHD daily - reinstate fluid restriction of 1.2 liters/day  2.  Hyponatremia: Secondary to CHF/impaired free water handling with poorly restricted free water intake. Worsened today.  Lasix as above.  Repeat BMP   3.  Hyperkalemia: resolved with CRRT  4.  Biventricular congestive heart failure: s/p CRRT for volume unloading after refractory to diuretics.  Unfortunately not a candidate for advanced therapies per CHF charting  5.  Atrial flutter with RVR: He underwent successful cardioversion and is on amiodarone   6. Anemia normocytic - mild  Subjective:   Difficulty with fluid restriction per charting and fluid restrictions were relaxed yesterday per charting.  He had 1.9 liters UF over 8/9 with CRRT.  Goal of net neg 150 ml/hr. 100 mL UOP is charted for 8/9.  His CRRT filter clotted on 8/9 at 3pm per charting and nephrology was never notified. He states that he had to "get the binge" out of his system yesterday and will do better with fluids.  He states at first that he only wants to take lasix PO then agrees to take IV lasix once I discussed that his kidneys would not respond to the PO lasix and this would hasten a decline toward hospice.  Review of systems:    denies shortness of breath  Denies  n/v No chest pain    Objective:   BP 129/70 (BP Location: Right Leg)   Pulse 79   Temp 98.8 F (37.1 C) (Oral)   Resp (!) 31   Ht '5\' 11"'$  (1.803 m)   Wt (!) 193.9 kg   SpO2 91%   BMI 59.62 kg/m   Intake/Output Summary (Last 24 hours) at 03/04/2021 0535 Last data filed at 03/04/2021 0400 Gross per 24 hour  Intake 5787.29 ml  Output 2740 ml  Net 3047.29 ml   Weight change: 4.3 kg  Physical Exam:   General adult male in bed in no acute distress on 15 liters oxygen HEENT normocephalic atraumatic extraocular movements intact sclera anicteric Neck supple trachea midline Lungs clear but reduced to auscultation bilaterally normal work of breathing at rest; on 15 liters oxygen Heart S1S2 no rub Abdomen soft nontender obese habitus  Extremities no edema lower extremities Psych normal mood and affect Access - RIJ nontunneled dialysis catheter   Imaging: No results found.  Labs: BMET Recent Labs  Lab 03/01/21 0455 03/01/21 1808 03/02/21 0615 03/02/21 1552 03/03/21 0031 03/03/21 1559 03/04/21 0112  NA 127* 127* 125* 128* 126* 124* 120*  K 3.7 3.8 4.1 4.2 4.0 4.4 4.8  CL 91* 91* 91* 93* 90* 89* 87*  CO2 '26 25 23 '$ 21* 20* 19* 18*  GLUCOSE 97 127* 108* 113* 99 163* 91  BUN 20  22* 21* 22* 22* 25* 32*  CREATININE 2.66* 2.85* 3.40* 3.47* 3.55* 4.03* 5.03*  CALCIUM 8.7* 8.3* 8.5* 8.7* 8.8* 9.2 8.9  PHOS 2.1* 2.1* 2.2* 3.2 3.2 2.9 3.7   CBC Recent Labs  Lab 02/28/21 0306 03/01/21 0455 03/02/21 0615 03/03/21 0031  WBC 19.4* 19.5* 18.8* 22.8*  HGB 10.8* 11.4* 12.0* 12.6*  HCT 33.5* 35.2* 36.9* 38.5*  MCV 92.5 92.4 91.3 90.8  PLT 260 197 108* 61*    Medications:     Chlorhexidine Gluconate Cloth  6 each Topical Daily   insulin aspart  0-5 Units Subcutaneous QHS   insulin aspart  0-9 Units Subcutaneous TID WC   mouth rinse  15 mL Mouth Rinse BID   multivitamin  1 tablet Oral QHS   senna-docusate  2 tablet Oral BID   sodium chloride flush  10-40 mL Intracatheter  Q12H   sodium chloride flush  3 mL Intravenous Q12H   Claudia Desanctis, MD 03/04/2021, 6:01 AM

## 2021-03-05 ENCOUNTER — Encounter (HOSPITAL_COMMUNITY): Payer: Self-pay | Admitting: Internal Medicine

## 2021-03-05 DIAGNOSIS — I5023 Acute on chronic systolic (congestive) heart failure: Secondary | ICD-10-CM | POA: Diagnosis not present

## 2021-03-05 LAB — RENAL FUNCTION PANEL
Albumin: 2.7 g/dL — ABNORMAL LOW (ref 3.5–5.0)
Anion gap: 16 — ABNORMAL HIGH (ref 5–15)
BUN: 52 mg/dL — ABNORMAL HIGH (ref 6–20)
CO2: 19 mmol/L — ABNORMAL LOW (ref 22–32)
Calcium: 9.2 mg/dL (ref 8.9–10.3)
Chloride: 85 mmol/L — ABNORMAL LOW (ref 98–111)
Creatinine, Ser: 7.36 mg/dL — ABNORMAL HIGH (ref 0.61–1.24)
GFR, Estimated: 9 mL/min — ABNORMAL LOW (ref >60)
Glucose, Bld: 95 mg/dL (ref 70–99)
Phosphorus: 6.4 mg/dL — ABNORMAL HIGH (ref 2.5–4.6)
Potassium: 4.7 mmol/L (ref 3.5–5.1)
Sodium: 120 mmol/L — ABNORMAL LOW (ref 135–145)

## 2021-03-05 LAB — HEPATITIS B CORE ANTIBODY, IGM: Hep B C IgM: NONREACTIVE

## 2021-03-05 LAB — HEPARIN LEVEL (UNFRACTIONATED): Heparin Unfractionated: 0.35 [IU]/mL (ref 0.30–0.70)

## 2021-03-05 LAB — CBC
HCT: 34.4 % — ABNORMAL LOW (ref 39.0–52.0)
Hemoglobin: 11.2 g/dL — ABNORMAL LOW (ref 13.0–17.0)
MCH: 29.7 pg (ref 26.0–34.0)
MCHC: 32.6 g/dL (ref 30.0–36.0)
MCV: 91.2 fL (ref 80.0–100.0)
Platelets: 55 10*3/uL — ABNORMAL LOW (ref 150–400)
RBC: 3.77 MIL/uL — ABNORMAL LOW (ref 4.22–5.81)
RDW: 16.9 % — ABNORMAL HIGH (ref 11.5–15.5)
WBC: 22.5 10*3/uL — ABNORMAL HIGH (ref 4.0–10.5)
nRBC: 0.2 % (ref 0.0–0.2)

## 2021-03-05 LAB — COOXEMETRY PANEL
Carboxyhemoglobin: 1.3 % (ref 0.5–1.5)
Methemoglobin: 0.8 % (ref 0.0–1.5)
O2 Saturation: 88.5 %
Total hemoglobin: 11.8 g/dL — ABNORMAL LOW (ref 12.0–16.0)

## 2021-03-05 LAB — HEPATITIS B SURFACE ANTIGEN: Hepatitis B Surface Ag: NONREACTIVE

## 2021-03-05 LAB — GLUCOSE, CAPILLARY
Glucose-Capillary: 96 mg/dL (ref 70–99)
Glucose-Capillary: 95 mg/dL (ref 70–99)
Glucose-Capillary: 120 mg/dL — ABNORMAL HIGH (ref 70–99)
Glucose-Capillary: 109 mg/dL — ABNORMAL HIGH (ref 70–99)

## 2021-03-05 LAB — APTT
aPTT: 75 seconds — ABNORMAL HIGH (ref 24–36)
aPTT: 74 seconds — ABNORMAL HIGH (ref 24–36)

## 2021-03-05 LAB — HEPATITIS B SURFACE ANTIBODY,QUALITATIVE: Hep B S Ab: REACTIVE — AB

## 2021-03-05 LAB — HEPARIN INDUCED PLATELET AB (HIT ANTIBODY): Heparin Induced Plt Ab: 2.335 {OD_unit} — ABNORMAL HIGH (ref 0.000–0.400)

## 2021-03-05 LAB — MAGNESIUM: Magnesium: 2.7 mg/dL — ABNORMAL HIGH (ref 1.7–2.4)

## 2021-03-05 MED ORDER — PENTAFLUOROPROP-TETRAFLUOROETH EX AERO: 1.0000 "application " | INHALATION_SPRAY | CUTANEOUS | Status: DC | PRN

## 2021-03-05 MED ORDER — WHITE PETROLATUM EX OINT
TOPICAL_OINTMENT | CUTANEOUS | Status: AC
  Filled 2021-03-05: qty 28.35

## 2021-03-05 MED ORDER — HEPARIN SODIUM (PORCINE) 1000 UNIT/ML IJ SOLN
INTRAMUSCULAR | Status: AC
  Administered 2021-03-05: 2400 [IU] via INTRAVENOUS
  Filled 2021-03-05: qty 4

## 2021-03-05 MED ORDER — LIDOCAINE-PRILOCAINE 2.5-2.5 % EX CREA
1.0000 "application " | TOPICAL_CREAM | CUTANEOUS | Status: DC | PRN
  Filled 2021-03-05: qty 5

## 2021-03-05 MED ORDER — SODIUM CHLORIDE 0.9 % IV SOLN
100.0000 mL | INTRAVENOUS | Status: DC | PRN
Start: 2021-03-05 — End: 2021-03-11

## 2021-03-05 MED ORDER — ALTEPLASE 2 MG IJ SOLR: 2.0000 mg | Freq: Once | INTRAMUSCULAR | Status: DC | PRN

## 2021-03-05 MED ORDER — LIDOCAINE HCL (PF) 1 % IJ SOLN: 5.0000 mL | INTRAMUSCULAR | Status: DC | PRN

## 2021-03-05 MED ORDER — HEPARIN SODIUM (PORCINE) 1000 UNIT/ML IJ SOLN: 1000.0000 [IU] | INTRAMUSCULAR | Status: DC | PRN

## 2021-03-05 MED ORDER — SODIUM CHLORIDE 0.9 % IV SOLN
0.0500 mg/kg/h | INTRAVENOUS | Status: DC
  Administered 2021-03-05: 0.05 mg/kg/h via INTRAVENOUS
  Filled 2021-03-05: qty 250

## 2021-03-05 MED ORDER — SODIUM CHLORIDE 0.9 % IV SOLN: 100.0000 mL | INTRAVENOUS | Status: DC | PRN

## 2021-03-05 NOTE — Progress Notes (Signed)
Patient ID: Phillip Young, male   DOB: 1986/02/27, 35 y.o.   MRN: [id number]     Advanced Heart Failure Rounding Note  PCP-Cardiologist: Dr. [name]  Subjective:    Remains off CVVHD with plan to attempt iHD today.    Unable to obtain co-ox or CVP -- no blood return from central line.    Weight stable today and net I/O is +2574; however, PO intake is considerably lower with only 858 mL charted x24 hours.   Oxygen requirement down to 8L, but he is short of breath this morning with accessory muscle breathing.  Platelet count down to 52 today.    He is lying in bed and reports that he feels fine and is hopeful for success with iHD today.  He denies chest pain, abdominal pain and nausea.    Objective:   Weight Range: (!) 193.9 kg Body mass index is 59.62 kg/m.   Vital Signs:   Temp:  [97.7 F (36.5 C)-98.4 F (36.9 C)] 97.7 F (36.5 C) (08/11 0400) Pulse Rate:  [72-86] 82 (08/11 0600) Resp:  [13-38] 33 (08/11 0600) BP: (104-150)/(40-104) 104/61 (08/11 0600) SpO2:  [90 %-100 %] 100 % (08/11 0600) Weight:  [193.9 kg] 193.9 kg (08/11 0500) Last BM Date: 03/01/21  Weight change: Filed Weights   03/03/21 0600 03/04/21 0500 03/05/21 0500  Weight: (!) 189.6 kg (!) 193.9 kg (!) 193.9 kg    Intake/Output:   Intake/Output Summary (Last 24 hours) at 03/05/2021 0743 Last data filed at 03/05/2021 0600 Gross per 24 hour  Intake 2599.68 ml  Output 25 ml  Net 2574.68 ml       Physical Exam   Obese male lying in bed. On 8L HFNC HEENT: normal Neck: supple. + HD cath.  Carotids 2+ bilat; no bruits. No lymphadenopathy or thryomegaly appreciated. Cor: PMI nondisplaced. Regular rate & rhythm.Distant HS Lungs: Diminished throughout Abdomen: markedly obese soft, nontender, nondistended. No hepatosplenomegaly. No bruits or masses. Good bowel sounds. Extremities: no cyanosis, clubbing, rash, 1 + edema Neuro: alert & orientedx3, cranial nerves grossly intact. moves all 4 extremities w/o  difficulty.  Affect pleasant.  Telemetry    NSR in 80s.  Personally reviewed   Labs    CBC Recent Labs    03/04/21 1222 03/05/21 0029  WBC 23.5* 22.5*  HGB 11.5* 11.2*  HCT 36.1* 34.4*  MCV 90.9 91.2  PLT 52* 55*    Basic Metabolic Panel Recent Labs    03/04/21 0112 03/04/21 1214 03/05/21 0029  NA 120* 120* 120*  K 4.8 4.5 4.7  CL 87* 87* 85*  CO2 18* 18* 19*  GLUCOSE 91 98 95  BUN 32* 41* 52*  CREATININE 5.03* 6.39* 7.36*  CALCIUM 8.9 9.1 9.2  MG 2.5*  --  2.7*  PHOS 3.7  --  6.4*    Liver Function Tests Recent Labs    03/04/21 0112 03/05/21 0029  ALBUMIN 2.8* 2.7*     No results for input(s): LIPASE, AMYLASE in the last 72 hours. Cardiac Enzymes No results for input(s): CKTOTAL, CKMB, CKMBINDEX, TROPONINI in the last 72 hours.  BNP: BNP (last 3 results) Recent Labs    10/10/20 1336 11/07/20 0932 02/11/21 1500  BNP 126.9* 106.0* 194.3*     ProBNP (last 3 results) No results for input(s): PROBNP in the last 8760 hours.   D-Dimer No results for input(s): DDIMER in the last 72 hours.  Hemoglobin A1C No results for input(s): HGBA1C in the last 72 hours.  Fasting Lipid  Panel No results for input(s): CHOL, HDL, LDLCALC, TRIG, CHOLHDL, LDLDIRECT in the last 72 hours. Thyroid Function Tests No results for input(s): TSH, T4TOTAL, T3FREE, THYROIDAB in the last 72 hours.  Invalid input(s): FREET3   Other results:   Imaging    No results found.   Medications:     Scheduled Medications:  (feeding supplement) PROSource Plus  30 mL Oral TID BM   amiodarone  400 mg Oral BID   Chlorhexidine Gluconate Cloth  6 each Topical Daily   Chlorhexidine Gluconate Cloth  6 each Topical Q0600   insulin aspart  0-5 Units Subcutaneous QHS   insulin aspart  0-9 Units Subcutaneous TID WC   mouth rinse  15 mL Mouth Rinse BID   multivitamin  1 tablet Oral QHS   senna-docusate  2 tablet Oral BID   sodium chloride flush  10-40 mL Intracatheter Q12H    sodium chloride flush  3 mL Intravenous Q12H    Infusions:  sodium chloride Stopped (02/27/21 1651)   DOBUTamine 7.5 mcg/kg/min (03/05/21 0614)   heparin 3,200 Units/hr (03/05/21 0600)   norepinephrine (LEVOPHED) Adult infusion 6 mcg/min (03/05/21 0613)    PRN Medications: sodium chloride, acetaminophen **OR** acetaminophen, diphenhydrAMINE, guaiFENesin-dextromethorphan, menthol-cetylpyridinium, ondansetron (ZOFRAN) IV, oxyCODONE, sodium chloride, sodium chloride flush, sodium chloride flush, sorbitol  Assessment/Plan   1. Acute on Chronic Biventricular Heart Failure -> cardiogenic shock:  Known NICM since 2019, previously followed by [facility].  Echo 8/21 EF < 20%, moderate LV dilation, severely decreased RV function, severe biatrial enlargement, mild MR. No history of ETOH/drugs.  No FH of cardiomyopathy (mother with [place]).  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  He is too large for cardiac MRI. RHC/LHC on 03/04/20 with no CAD low CI at 2.1. Evaluated by EP but not a candidate for ICD given BMI.  Echo this admission with EF < 20%, RV poorly visualized. He now has CHF exacerbation/shock likely in setting of AFL with RVR.  He was hypotensive with poor UOP, concern for cardiogenic shock.  Dobutamine therefore started at 2.5 and increased to 7.5.  Failed NE wean, added back 08/07. Unable to obtain co-ox today -- no blood return from CVC - With progressive renal failure, CVVHD started.  However, catheter clotted on 8/9 and unable to resume.   - Discussed with patient on 8/10 that renal recovery is unlikely.  He is aware that if he is unable to tolerate iHD due to hypotension, his only option is hospice  - Fluid restriction has been an issue, but over the past 24 hours, his intake was considerably lower and within the 1200 mL limit.  He expressed remorse over his noncompliance with the prescribed restriction - The morning after discontinuing CVVHD,  weight up 10 pounds with +3.5L fluid balance - BID Lasix resumed by nephrology to start 8/10, now discontinued due to anuria  - Now in NSR after successful DCCV on 02/27/2021.  - With AKI/hypotension, stopped Entresto, dapagliflozin, spironolactone, digoxin.  - With marked obesity, he is not a candidate for advanced therapies.   2. Aflutter w/ RVR:  Noted to be in Afib during a prior admission in 3/22 in the setting of CAP and a/c CHF, but converted to NSR on amiodarone. Discharged home w/ eliquis. Was in 2:1 AFL this admit in setting of a/c CHF w/ marked volume overload, + recent gastroenteritis w/ diarrhea. Has OSA but not using CPAP. TSH WNL. Admits to intermittent compliance w/ Eliquis. Has missed several doses  in the last month. Now in NSR s/p DCCV on 8/5. - c/w amio drip at 30 mg per hour.  - Continue heparin drip for now -- low plt count on 8/9-8/11 noted. HIT pending - Resume Eliquis if no other procedures planned   3. AKI: Creatinine now up to 5.Concern for cardiorenal syndrome.  Renal US with no hydronephrosis.   - CVVHD stopped on 8/9 after catheter clotted.  Per nephrology, will not resume CVVHD and will attempt iHD; however, may be limited by hypotension - BID Lasix resumed by nephrology on 8/10, but discontinued due to anuria   4. Acute Hypoxic Respiratory Failure: Suspect underlying OHS/OSA exacerbated by CHF this admission.  - Continue oxygen + CPAP. Now on 8L of oxygen (15 yesterday) - remains tenuous   5. Type 2 DM: Farxiga held with AKI.   6. OSA: Has not been on Bipap at home.   7. Super Morbid Obesity: Body mass index is 59.62 kg/m.  8. UTI: Suspect by UA, sent urine culture.  ?Urinary obstruction related to this => no hydronephrosis on renal US.  - Ceftriaxone IV course completed.   9. Hyponatremia: Na 120 - on CVVHD, management per nephrology  - Plan for iHD today   10. Leukocytosis: WBC elevated to 23, but stable - Afebrile - BCx NGTD  - Chest x-ray stable  08/07  11. Thrombocytopenia - Plt count down to 52 on 8/11  - HIT panel pending, but do not suspect it is HIT due to the length of time on heparin with stable plt count  GOC: -Palliative care following -Currently full code -Discussed with patient that likelihood that his renal function will not recover and that the next "test" is whether he can tolerate iHD -Not a candidate for advanced therapies. - Now expressing remorse for his behavior toward staff and for his noncompliance with treatment regimen   Length of Stay: [place], NP  03/05/2021, 7:43 AM  Advanced Heart Failure Team Pager [phone] (M-F; 7a - 5p)   Agree with above.   Remains on DBA 7.5 NE 6. Anuric.  Unable to get co-ox or CVP due to pigtail clotted on CRT. Remains on 8L O2. PLTs stable at 52K. SNa 120  General:  Sitting in chair. Mildly dyspneic HEENT: normal Neck: supple. RIJ HD cath  Carotids 2+ bilat; no bruits. No lymphadenopathy or thryomegaly appreciated. Cor: PMI nondisplaced. Regular rate & rhythm. No rubs, gallops or murmurs. Lungs: + crackles Abdomen: obese soft, nontender, nondistended. No hepatosplenomegaly. No bruits or masses. Good bowel sounds. Extremities: no cyanosis, clubbing, rash, 1+ edema Neuro: alert & orientedx3, cranial nerves grossly intact. moves all 4 extremities w/o difficulty. Affect pleasant  Remains extremely tenuous. Plan for trial of iHD today with pressor support. If unable to tolerate will need Hospice. If able to tolerate will need to start weaning pressors and see if we can bridge him to outpatient HD. Watch WBC and PLTs. Will need to consider tunneled cath if he tolerates HD.   CRITICAL CARE Performed by: Glori Bickers  Total critical care time: 35 minutes  Critical care time was exclusive of separately billable procedures and treating other patients.  Critical care was necessary to treat or prevent imminent or life-threatening deterioration.  Critical care  was time spent personally by me (independent of midlevel providers or residents) on the following activities: development of treatment plan with patient and/or surrogate as well as nursing, discussions with consultants, evaluation of patient's response to treatment, examination  of patient, obtaining history from patient or surrogate, ordering and performing treatments and interventions, ordering and review of laboratory studies, ordering and review of radiographic studies, pulse oximetry and re-evaluation of patient's condition.  Glori Bickers, MD  8:33 AM

## 2021-03-05 NOTE — Progress Notes (Signed)
Disability forms completed, signed by Dr Haroldine Laws, and faxed to [facility] at [phone]

## 2021-03-05 NOTE — Progress Notes (Signed)
Unable to obtain Cooxemetry panel labs this am. Central line (trialysis) has no blood return.

## 2021-03-05 NOTE — H&P (View-Only) (Signed)
Patient ID: Phillip Young, male   DOB: 1986/04/26, 35 y.o.   MRN: [id number]     Advanced Heart Failure Rounding Note  PCP-Cardiologist: Dr. [name]  Subjective:    Remains off CVVHD with plan to attempt iHD today.    Unable to obtain co-ox or CVP -- no blood return from central line.    Weight stable today and net I/O is +2574; however, PO intake is considerably lower with only 858 mL charted x24 hours.   Oxygen requirement down to 8L, but he is short of breath this morning with accessory muscle breathing.  Platelet count down to 52 today.    He is lying in bed and reports that he feels fine and is hopeful for success with iHD today.  He denies chest pain, abdominal pain and nausea.    Objective:   Weight Range: (!) 193.9 kg Body mass index is 59.62 kg/m.   Vital Signs:   Temp:  [97.7 F (36.5 C)-98.4 F (36.9 C)] 97.7 F (36.5 C) (08/11 0400) Pulse Rate:  [72-86] 82 (08/11 0600) Resp:  [13-38] 33 (08/11 0600) BP: (104-150)/(40-104) 104/61 (08/11 0600) SpO2:  [90 %-100 %] 100 % (08/11 0600) Weight:  [193.9 kg] 193.9 kg (08/11 0500) Last BM Date: 03/01/21  Weight change: Filed Weights   03/03/21 0600 03/04/21 0500 03/05/21 0500  Weight: (!) 189.6 kg (!) 193.9 kg (!) 193.9 kg    Intake/Output:   Intake/Output Summary (Last 24 hours) at 03/05/2021 0743 Last data filed at 03/05/2021 0600 Gross per 24 hour  Intake 2599.68 ml  Output 25 ml  Net 2574.68 ml       Physical Exam   Obese male lying in bed. On 8L HFNC HEENT: normal Neck: supple. + HD cath.  Carotids 2+ bilat; no bruits. No lymphadenopathy or thryomegaly appreciated. Cor: PMI nondisplaced. Regular rate & rhythm.Distant HS Lungs: Diminished throughout Abdomen: markedly obese soft, nontender, nondistended. No hepatosplenomegaly. No bruits or masses. Good bowel sounds. Extremities: no cyanosis, clubbing, rash, 1 + edema Neuro: alert & orientedx3, cranial nerves grossly intact. moves all 4 extremities w/o  difficulty.  Affect pleasant.  Telemetry    NSR in 80s.  Personally reviewed   Labs    CBC Recent Labs    03/04/21 1222 03/05/21 0029  WBC 23.5* 22.5*  HGB 11.5* 11.2*  HCT 36.1* 34.4*  MCV 90.9 91.2  PLT 52* 55*    Basic Metabolic Panel Recent Labs    03/04/21 0112 03/04/21 1214 03/05/21 0029  NA 120* 120* 120*  K 4.8 4.5 4.7  CL 87* 87* 85*  CO2 18* 18* 19*  GLUCOSE 91 98 95  BUN 32* 41* 52*  CREATININE 5.03* 6.39* 7.36*  CALCIUM 8.9 9.1 9.2  MG 2.5*  --  2.7*  PHOS 3.7  --  6.4*    Liver Function Tests Recent Labs    03/04/21 0112 03/05/21 0029  ALBUMIN 2.8* 2.7*     No results for input(s): LIPASE, AMYLASE in the last 72 hours. Cardiac Enzymes No results for input(s): CKTOTAL, CKMB, CKMBINDEX, TROPONINI in the last 72 hours.  BNP: BNP (last 3 results) Recent Labs    10/10/20 1336 11/07/20 0932 02/11/21 1500  BNP 126.9* 106.0* 194.3*     ProBNP (last 3 results) No results for input(s): PROBNP in the last 8760 hours.   D-Dimer No results for input(s): DDIMER in the last 72 hours.  Hemoglobin A1C No results for input(s): HGBA1C in the last 72 hours.  Fasting Lipid  Panel No results for input(s): CHOL, HDL, LDLCALC, TRIG, CHOLHDL, LDLDIRECT in the last 72 hours. Thyroid Function Tests No results for input(s): TSH, T4TOTAL, T3FREE, THYROIDAB in the last 72 hours.  Invalid input(s): FREET3   Other results:   Imaging    No results found.   Medications:     Scheduled Medications:  (feeding supplement) PROSource Plus  30 mL Oral TID BM   amiodarone  400 mg Oral BID   Chlorhexidine Gluconate Cloth  6 each Topical Daily   Chlorhexidine Gluconate Cloth  6 each Topical Q0600   insulin aspart  0-5 Units Subcutaneous QHS   insulin aspart  0-9 Units Subcutaneous TID WC   mouth rinse  15 mL Mouth Rinse BID   multivitamin  1 tablet Oral QHS   senna-docusate  2 tablet Oral BID   sodium chloride flush  10-40 mL Intracatheter Q12H    sodium chloride flush  3 mL Intravenous Q12H    Infusions:  sodium chloride Stopped (02/27/21 1651)   DOBUTamine 7.5 mcg/kg/min (03/05/21 0614)   heparin 3,200 Units/hr (03/05/21 0600)   norepinephrine (LEVOPHED) Adult infusion 6 mcg/min (03/05/21 0613)    PRN Medications: sodium chloride, acetaminophen **OR** acetaminophen, diphenhydrAMINE, guaiFENesin-dextromethorphan, menthol-cetylpyridinium, ondansetron (ZOFRAN) IV, oxyCODONE, sodium chloride, sodium chloride flush, sodium chloride flush, sorbitol  Assessment/Plan   1. Acute on Chronic Biventricular Heart Failure -> cardiogenic shock:  Known NICM since 2019, previously followed by [facility].  Echo 8/21 EF < 20%, moderate LV dilation, severely decreased RV function, severe biatrial enlargement, mild MR. No history of ETOH/drugs.  No FH of cardiomyopathy (mother with [place]).  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  He is too large for cardiac MRI. RHC/LHC on 03/04/20 with no CAD low CI at 2.1. Evaluated by EP but not a candidate for ICD given BMI.  Echo this admission with EF < 20%, RV poorly visualized. He now has CHF exacerbation/shock likely in setting of AFL with RVR.  He was hypotensive with poor UOP, concern for cardiogenic shock.  Dobutamine therefore started at 2.5 and increased to 7.5.  Failed NE wean, added back 08/07. Unable to obtain co-ox today -- no blood return from CVC - With progressive renal failure, CVVHD started.  However, catheter clotted on 8/9 and unable to resume.   - Discussed with patient on 8/10 that renal recovery is unlikely.  He is aware that if he is unable to tolerate iHD due to hypotension, his only option is hospice  - Fluid restriction has been an issue, but over the past 24 hours, his intake was considerably lower and within the 1200 mL limit.  He expressed remorse over his noncompliance with the prescribed restriction - The morning after discontinuing CVVHD,  weight up 10 pounds with +3.5L fluid balance - BID Lasix resumed by nephrology to start 8/10, now discontinued due to anuria  - Now in NSR after successful DCCV on 02/27/2021.  - With AKI/hypotension, stopped Entresto, dapagliflozin, spironolactone, digoxin.  - With marked obesity, he is not a candidate for advanced therapies.   2. Aflutter w/ RVR:  Noted to be in Afib during a prior admission in 3/22 in the setting of CAP and a/c CHF, but converted to NSR on amiodarone. Discharged home w/ eliquis. Was in 2:1 AFL this admit in setting of a/c CHF w/ marked volume overload, + recent gastroenteritis w/ diarrhea. Has OSA but not using CPAP. TSH WNL. Admits to intermittent compliance w/ Eliquis. Has missed several doses  in the last month. Now in NSR s/p DCCV on 8/5. - c/w amio drip at 30 mg per hour.  - Continue heparin drip for now -- low plt count on 8/9-8/11 noted. HIT pending - Resume Eliquis if no other procedures planned   3. AKI: Creatinine now up to 5.Concern for cardiorenal syndrome.  Renal US with no hydronephrosis.   - CVVHD stopped on 8/9 after catheter clotted.  Per nephrology, will not resume CVVHD and will attempt iHD; however, may be limited by hypotension - BID Lasix resumed by nephrology on 8/10, but discontinued due to anuria   4. Acute Hypoxic Respiratory Failure: Suspect underlying OHS/OSA exacerbated by CHF this admission.  - Continue oxygen + CPAP. Now on 8L of oxygen (15 yesterday) - remains tenuous   5. Type 2 DM: Farxiga held with AKI.   6. OSA: Has not been on Bipap at home.   7. Super Morbid Obesity: Body mass index is 59.62 kg/m.  8. UTI: Suspect by UA, sent urine culture.  ?Urinary obstruction related to this => no hydronephrosis on renal US.  - Ceftriaxone IV course completed.   9. Hyponatremia: Na 120 - on CVVHD, management per nephrology  - Plan for iHD today   10. Leukocytosis: WBC elevated to 23, but stable - Afebrile - BCx NGTD  - Chest x-ray stable  08/07  11. Thrombocytopenia - Plt count down to 52 on 8/11  - HIT panel pending, but do not suspect it is HIT due to the length of time on heparin with stable plt count  GOC: -Palliative care following -Currently full code -Discussed with patient that likelihood that his renal function will not recover and that the next "test" is whether he can tolerate iHD -Not a candidate for advanced therapies. - Now expressing remorse for his behavior toward staff and for his noncompliance with treatment regimen   Length of Stay: [place], NP  03/05/2021, 7:43 AM  Advanced Heart Failure Team Pager [phone] (M-F; 7a - 5p)   Agree with above.   Remains on DBA 7.5 NE 6. Anuric.  Unable to get co-ox or CVP due to pigtail clotted on CRT. Remains on 8L O2. PLTs stable at 52K. SNa 120  General:  Sitting in chair. Mildly dyspneic HEENT: normal Neck: supple. RIJ HD cath  Carotids 2+ bilat; no bruits. No lymphadenopathy or thryomegaly appreciated. Cor: PMI nondisplaced. Regular rate & rhythm. No rubs, gallops or murmurs. Lungs: + crackles Abdomen: obese soft, nontender, nondistended. No hepatosplenomegaly. No bruits or masses. Good bowel sounds. Extremities: no cyanosis, clubbing, rash, 1+ edema Neuro: alert & orientedx3, cranial nerves grossly intact. moves all 4 extremities w/o difficulty. Affect pleasant  Remains extremely tenuous. Plan for trial of iHD today with pressor support. If unable to tolerate will need Hospice. If able to tolerate will need to start weaning pressors and see if we can bridge him to outpatient HD. Watch WBC and PLTs. Will need to consider tunneled cath if he tolerates HD.   CRITICAL CARE Performed by: Glori Bickers  Total critical care time: 35 minutes  Critical care time was exclusive of separately billable procedures and treating other patients.  Critical care was necessary to treat or prevent imminent or life-threatening deterioration.  Critical care  was time spent personally by me (independent of midlevel providers or residents) on the following activities: development of treatment plan with patient and/or surrogate as well as nursing, discussions with consultants, evaluation of patient's response to treatment, examination  of patient, obtaining history from patient or surrogate, ordering and performing treatments and interventions, ordering and review of laboratory studies, ordering and review of radiographic studies, pulse oximetry and re-evaluation of patient's condition.  Glori Bickers, MD  8:33 AM

## 2021-03-05 NOTE — TOC CM/SW Note (Signed)
HF TOC CM returned pt's disability paperwork to him and explained he will need to follow up with [employer] and employer to ensure he is in good standing with his job. [place], Heart Failure TOC CM [phone]

## 2021-03-05 NOTE — Progress Notes (Addendum)
ANTICOAGULATION CONSULT NOTE - Follow Up Consult  Pharmacy Consult for heparin to bivalirudin Indication: atrial fibrillation  Labs: Recent Labs    03/03/21 0031 03/03/21 1559 03/04/21 0112 03/04/21 1214 03/04/21 1222 03/05/21 0029  HGB 12.6*  --   --   --  11.5* 11.2*  HCT 38.5*  --   --   --  36.1* 34.4*  PLT 61*  --   --   --  52* 55*  HEPARINUNFRC 0.68  --  0.33  --   --  0.35  CREATININE 3.55*   < > 5.03* 6.39*  --  7.36*   < > = values in this interval not displayed.     Assessment: 35yo male on heparin drip for AFib now in SR on amiodarone  with heparin level 0.35 therapeutic at goal on heparin 3200 uts/hr, no infusion issues or signs of bleeding per RN, h/h stable pltc fell yesterday but stable 50-60s.  HIT Ab positive, will start bivalirudin. Will utilize actual body weight as recommended by Micromedex and use renal adjusted dose for RRT.  Goal of Therapy:  aPTT 50-85 seconds   Plan:  Stop all heparin - allergy added Bivalirudin 0.'05mg'$ /kg/h Check aPTT in ~4h   ADDENDUM: aPTT is therapeutic at 74 seconds. Will continue current rate and confirm with am labs.   Arrie Senate, PharmD, BCPS, [facility] Clinical Pharmacist [phone] Please check AMION for all [place] numbers 03/05/2021

## 2021-03-05 NOTE — Progress Notes (Signed)
Pharmacy Heparin Induced Thrombocytopenia (HIT) Note:  Phillip Young is an 35 y.o. male being evaluated for HIT. Heparin was started 7/30 for possible procedures while holding apixaban for AF, and baseline platelets were 208.   HIT labs were ordered on 8/10 when platelets dropped to 52.  Auto-populate labs:  Heparin Induced Plt Ab  Date/Time Value Ref Range Status  03/04/2021 12:14 PM 2.335 (H) 0.000 - 0.400 OD Final    Comment:    (NOTE) Performed At: [facility] [place], [place] [id number] [name] [id number]      CALCULATE SCORE:  4Ts (see the HIT Algorithm) Score  Thrombocytopenia 2  Timing 2  Thrombosis 0  Other causes of thrombocytopenia 0  Total 4     Recommendations (A or B) are based on available lab results (HIT antibody and/or SRA) and the HIT algorithm    A. HIT antibody result available  Possible HIT   Order SRA:  Yes Discontinue heparin / LMWH:  Yes Initiate alternative anticoagulation:  Yes Document heparin allergy:  Yes   B. SRA result availability  SRA not available   Name of MD Contacted: advanced HF service  Plan (Discussed with provider) Labs ordered:  SRA ordered  Heparin allergy:  Heparin allergy documented or updated. Anticoagulation plans:  Begin alternative anticoagulation with bivalirudin   Arrie Senate, PharmD, BCPS, [facility] Clinical Pharmacist [phone] Please check AMION for all [place] numbers 03/05/2021

## 2021-03-05 NOTE — Progress Notes (Signed)
Patient ID: Phillip Young, male   DOB: 1985/09/27, 35 y.o.   MRN: [id number] [facility] Progress Note   Assessment/ Plan:   1. Acute kidney Injury: Secondary to cardiogenic shock/hemodynamically mediated in the setting of atrial flutter with RVR and underlying severe biventricular congestive heart failure.  As of 8/9 he is down 73 lbs since admission.  High PO intake has been a struggle  - HD today.  He would need to transition to hospice if he is not able to tolerate intermittent hemodialysis.  Defer additional CRRT as documented by myself and CHF team - reinstated fluid restriction of 1.2 liters/day - stopped lasix as anuric  2.  Hyponatremia: Secondary to CHF/impaired free water handling with poorly restricted free water intake.  For HD as above  3.  Hyperkalemia: improved with RRT.   4.  Biventricular congestive heart failure: s/p CRRT for volume unloading after refractory to diuretics.  Unfortunately not a candidate for advanced therapies per CHF charting.  Not a candidate for additional CRRT  5.  Atrial flutter with RVR: He underwent cardioversion and is on amiodarone   6. Anemia normocytic - mild; no indication for ESA  7. Hyperphosphatemia - follow with HD. Change to renal diet  Subjective:   He was anuric over 8/10.  Per charting dialysis catheter with no blood return - nursing had attempted labs this am through same.  This has been an ongoing issue with that trialysis line but his dialysis has gone fine.  He did better with fluid restriction yesterday.  Last CVP was 9 - patient and RN tell me this.  He has been on levo at 6 mcg/min and dobutamine.   Review of systems:   denies shortness of breath  Denies n/v No chest pain    Objective:   BP (!) 111/40   Pulse 72   Temp 97.7 F (36.5 C) (Oral)   Resp (!) 35   Ht '5\' 11"'$  (1.803 m)   Wt (!) 193.9 kg   SpO2 96%   BMI 59.62 kg/m   Intake/Output Summary (Last 24 hours) at 03/05/2021 0539 Last data filed at  03/05/2021 0500 Gross per 24 hour  Intake 2507.1 ml  Output 25 ml  Net 2482.1 ml   Weight change: 0 kg  Physical Exam:   General adult male in bed in no acute distress  HEENT normocephalic atraumatic extraocular movements intact sclera anicteric Neck supple trachea midline Lungs clear but reduced to auscultation bilaterally normal work of breathing at rest; on 8 liters oxygen Heart S1S2 no rub Abdomen soft nontender obese habitus  Extremities no edema lower extremities Psych normal mood and affect Access - RIJ nontunneled dialysis catheter   Imaging: No results found.  Labs: BMET Recent Labs  Lab 03/01/21 1808 03/02/21 0615 03/02/21 1552 03/03/21 0031 03/03/21 1559 03/04/21 0112 03/04/21 1214 03/05/21 0029  NA 127* 125* 128* 126* 124* 120* 120* 120*  K 3.8 4.1 4.2 4.0 4.4 4.8 4.5 4.7  CL 91* 91* 93* 90* 89* 87* 87* 85*  CO2 25 23 21* 20* 19* 18* 18* 19*  GLUCOSE 127* 108* 113* 99 163* 91 98 95  BUN 22* 21* 22* 22* 25* 32* 41* 52*  CREATININE 2.85* 3.40* 3.47* 3.55* 4.03* 5.03* 6.39* 7.36*  CALCIUM 8.3* 8.5* 8.7* 8.8* 9.2 8.9 9.1 9.2  PHOS 2.1* 2.2* 3.2 3.2 2.9 3.7  --  6.4*   CBC Recent Labs  Lab 03/02/21 0615 03/03/21 0031 03/04/21 1222 03/05/21 0029  WBC 18.8*  22.8* 23.5* 22.5*  HGB 12.0* 12.6* 11.5* 11.2*  HCT 36.9* 38.5* 36.1* 34.4*  MCV 91.3 90.8 90.9 91.2  PLT 108* 61* 52* 55*    Medications:     (feeding supplement) PROSource Plus  30 mL Oral TID BM   amiodarone  400 mg Oral BID   Chlorhexidine Gluconate Cloth  6 each Topical Daily   Chlorhexidine Gluconate Cloth  6 each Topical Q0600   furosemide  80 mg Intravenous BID   insulin aspart  0-5 Units Subcutaneous QHS   insulin aspart  0-9 Units Subcutaneous TID WC   mouth rinse  15 mL Mouth Rinse BID   multivitamin  1 tablet Oral QHS   senna-docusate  2 tablet Oral BID   sodium chloride flush  10-40 mL Intracatheter Q12H   sodium chloride flush  3 mL Intravenous Q12H   Claudia Desanctis,  MD 03/05/2021, 5:57 AM

## 2021-03-05 NOTE — Plan of Care (Signed)
  Problem: Education: Goal: Knowledge of General Education information will improve Description: Including pain rating scale, medication(s)/side effects and non-pharmacologic comfort measures Outcome: Progressing   Problem: Health Behavior/Discharge Planning: Goal: Ability to manage health-related needs will improve Outcome: Progressing   Problem: Clinical Measurements: Goal: Ability to maintain clinical measurements within normal limits will improve Outcome: Progressing Goal: Will remain free from infection Outcome: Progressing Goal: Diagnostic test results will improve Outcome: Progressing Goal: Respiratory complications will improve Outcome: Progressing Goal: Cardiovascular complication will be avoided Outcome: Progressing   Problem: Activity: Goal: Risk for activity intolerance will decrease Outcome: Progressing   Problem: Coping: Goal: Level of anxiety will decrease Outcome: Progressing   Problem: Elimination: Goal: Will not experience complications related to bowel motility Outcome: Progressing Goal: Will not experience complications related to urinary retention Outcome: Progressing   Problem: Pain Managment: Goal: General experience of comfort will improve Outcome: Progressing   Problem: Safety: Goal: Ability to remain free from injury will improve Outcome: Progressing   Problem: Skin Integrity: Goal: Risk for impaired skin integrity will decrease Outcome: Progressing   Problem: Education: Goal: Knowledge of disease and its progression will improve Outcome: Progressing   Problem: Health Behavior/Discharge Planning: Goal: Ability to manage health-related needs will improve Outcome: Progressing   Problem: Clinical Measurements: Goal: Complications related to the disease process or treatment will be avoided or minimized Outcome: Progressing Goal: Dialysis access will remain free of complications Outcome: Progressing   Problem: Activity: Goal: Activity  intolerance will improve Outcome: Progressing   Problem: Fluid Volume: Goal: Fluid volume balance will be maintained or improved Outcome: Progressing   Problem: Nutritional: Goal: Ability to make appropriate dietary choices will improve Outcome: Progressing   Problem: Respiratory: Goal: Respiratory symptoms related to disease process will be avoided Outcome: Progressing   Problem: Self-Concept: Goal: Body image disturbance will be avoided or minimized Outcome: Progressing   Problem: Urinary Elimination: Goal: Progression of disease will be identified and treated Outcome: Progressing

## 2021-03-05 NOTE — Progress Notes (Signed)
ANTICOAGULATION CONSULT NOTE - Follow Up Consult  Pharmacy Consult for heparin Indication: atrial fibrillation  Labs: Recent Labs    03/03/21 0031 03/03/21 1559 03/04/21 0112 03/04/21 1214 03/04/21 1222 03/05/21 0029  HGB 12.6*  --   --   --  11.5* 11.2*  HCT 38.5*  --   --   --  36.1* 34.4*  PLT 61*  --   --   --  52* 55*  HEPARINUNFRC 0.68  --  0.33  --   --  0.35  CREATININE 3.55*   < > 5.03* 6.39*  --  7.36*   < > = values in this interval not displayed.     Assessment: 35yo male on heparin drip for AFib now in SR on amiodarone  with heparin level 0.35 therapeutic at goal on heparin 3200 uts/hr, no infusion issues or signs of bleeding per RN, h/h stable pltc fell yesterday but stable 50-60s    Goal of Therapy:  Heparin level 0.3-0.7 units/ml   Plan:  Continue systemic heparin infusion to 3200 units/hr Check daily heparin level and CBC   Bonnita Nasuti Pharm.D. CPP, BCPS Clinical Pharmacist [phone] 03/05/2021 8:33 AM   03/05/2021 8:33 AM

## 2021-03-06 ENCOUNTER — Encounter (HOSPITAL_COMMUNITY)
Admission: EM | Disposition: A | Discharge status: Home / Self Care | Payer: Self-pay | Source: Home / Self Care | Attending: Cardiology

## 2021-03-06 DIAGNOSIS — I5023 Acute on chronic systolic (congestive) heart failure: Secondary | ICD-10-CM | POA: Diagnosis not present

## 2021-03-06 HISTORY — PX: RIGHT HEART CATH: CATH118263

## 2021-03-06 HISTORY — PX: TEMPORARY DIALYSIS CATHETER: CATH118312

## 2021-03-06 LAB — RENAL FUNCTION PANEL
Albumin: 2.6 g/dL — ABNORMAL LOW (ref 3.5–5.0)
Anion gap: 15 (ref 5–15)
BUN: 47 mg/dL — ABNORMAL HIGH (ref 6–20)
CO2: 19 mmol/L — ABNORMAL LOW (ref 22–32)
Calcium: 8.3 mg/dL — ABNORMAL LOW (ref 8.9–10.3)
Chloride: 87 mmol/L — ABNORMAL LOW (ref 98–111)
Creatinine, Ser: 7.08 mg/dL — ABNORMAL HIGH (ref 0.61–1.24)
GFR, Estimated: 10 mL/min — ABNORMAL LOW (ref >60)
Glucose, Bld: 134 mg/dL — ABNORMAL HIGH (ref 70–99)
Phosphorus: 5.7 mg/dL — ABNORMAL HIGH (ref 2.5–4.6)
Potassium: 4.3 mmol/L (ref 3.5–5.1)
Sodium: 121 mmol/L — ABNORMAL LOW (ref 135–145)

## 2021-03-06 LAB — GLUCOSE, CAPILLARY
Glucose-Capillary: 254 mg/dL — ABNORMAL HIGH (ref 70–99)
Glucose-Capillary: 97 mg/dL (ref 70–99)
Glucose-Capillary: 93 mg/dL (ref 70–99)
Glucose-Capillary: 135 mg/dL — ABNORMAL HIGH (ref 70–99)
Glucose-Capillary: 110 mg/dL — ABNORMAL HIGH (ref 70–99)
Glucose-Capillary: 120 mg/dL — ABNORMAL HIGH (ref 70–99)

## 2021-03-06 LAB — POCT I-STAT EG7
Acid-base deficit: 5 mmol/L — ABNORMAL HIGH (ref 0.0–2.0)
Acid-base deficit: 7 mmol/L — ABNORMAL HIGH (ref 0.0–2.0)
Bicarbonate: 20.6 mmol/L (ref 20.0–28.0)
Bicarbonate: 22.3 mmol/L (ref 20.0–28.0)
Calcium, Ion: 1.08 mmol/L — ABNORMAL LOW (ref 1.15–1.40)
Calcium, Ion: 1.13 mmol/L — ABNORMAL LOW (ref 1.15–1.40)
HCT: 32 % — ABNORMAL LOW (ref 39.0–52.0)
HCT: 35 % — ABNORMAL LOW (ref 39.0–52.0)
Hemoglobin: 10.9 g/dL — ABNORMAL LOW (ref 13.0–17.0)
Hemoglobin: 11.9 g/dL — ABNORMAL LOW (ref 13.0–17.0)
O2 Saturation: 52 %
O2 Saturation: 54 %
Potassium: 4.4 mmol/L (ref 3.5–5.1)
Potassium: 4.8 mmol/L (ref 3.5–5.1)
Sodium: 122 mmol/L — ABNORMAL LOW (ref 135–145)
Sodium: 125 mmol/L — ABNORMAL LOW (ref 135–145)
TCO2: 22 mmol/L (ref 22–32)
TCO2: 24 mmol/L (ref 22–32)
pCO2, Ven: 47.3 mmHg (ref 44.0–60.0)
pCO2, Ven: 51.8 mmHg (ref 44.0–60.0)
pH, Ven: 7.242 — ABNORMAL LOW (ref 7.250–7.430)
pH, Ven: 7.247 — ABNORMAL LOW (ref 7.250–7.430)
pO2, Ven: 33 mmHg (ref 32.0–45.0)
pO2, Ven: 34 mmHg (ref 32.0–45.0)

## 2021-03-06 LAB — CBC
HCT: 33.4 % — ABNORMAL LOW (ref 39.0–52.0)
Hemoglobin: 10.6 g/dL — ABNORMAL LOW (ref 13.0–17.0)
MCH: 29.4 pg (ref 26.0–34.0)
MCHC: 31.7 g/dL (ref 30.0–36.0)
MCV: 92.8 fL (ref 80.0–100.0)
Platelets: 41 10*3/uL — ABNORMAL LOW (ref 150–400)
RBC: 3.6 MIL/uL — ABNORMAL LOW (ref 4.22–5.81)
RDW: 17.4 % — ABNORMAL HIGH (ref 11.5–15.5)
WBC: 22.9 10*3/uL — ABNORMAL HIGH (ref 4.0–10.5)
nRBC: 0.3 % — ABNORMAL HIGH (ref 0.0–0.2)

## 2021-03-06 LAB — COOXEMETRY PANEL
Carboxyhemoglobin: 1.2 % (ref 0.5–1.5)
Methemoglobin: 0.8 % (ref 0.0–1.5)
O2 Saturation: 65.6 %
Total hemoglobin: 10.6 g/dL — ABNORMAL LOW (ref 12.0–16.0)

## 2021-03-06 LAB — APTT: aPTT: 77 seconds — ABNORMAL HIGH (ref 24–36)

## 2021-03-06 LAB — MAGNESIUM: Magnesium: 2.5 mg/dL — ABNORMAL HIGH (ref 1.7–2.4)

## 2021-03-06 SURGERY — RIGHT HEART CATH
Anesthesia: LOCAL

## 2021-03-06 MED ORDER — ACETAMINOPHEN 325 MG PO TABS: 650.0000 mg | ORAL_TABLET | ORAL | Status: DC | PRN

## 2021-03-06 MED ORDER — HYDRALAZINE HCL 20 MG/ML IJ SOLN: 10.0000 mg | INTRAMUSCULAR | Status: AC | PRN

## 2021-03-06 MED ORDER — SODIUM CHLORIDE 0.9% FLUSH
3.0000 mL | INTRAVENOUS | Status: DC | PRN
  Administered 2021-03-16: 3 mL via INTRAVENOUS

## 2021-03-06 MED ORDER — ONDANSETRON HCL 4 MG/2ML IJ SOLN: 4.0000 mg | Freq: Four times a day (QID) | INTRAMUSCULAR | Status: DC | PRN

## 2021-03-06 MED ORDER — SODIUM CHLORIDE 0.9 % IV SOLN: 250.0000 mL | INTRAVENOUS | Status: DC | PRN

## 2021-03-06 MED ORDER — CAMPHOR-MENTHOL 0.5-0.5 % EX LOTN
TOPICAL_LOTION | CUTANEOUS | Status: DC | PRN
  Administered 2021-03-12: 1 via TOPICAL
  Filled 2021-03-06: qty 222

## 2021-03-06 MED ORDER — LIDOCAINE HCL (PF) 1 % IJ SOLN
INTRAMUSCULAR | Status: AC
  Filled 2021-03-06: qty 30

## 2021-03-06 MED ORDER — CHLORHEXIDINE GLUCONATE CLOTH 2 % EX PADS
6.0000 | MEDICATED_PAD | Freq: Every day | CUTANEOUS | Status: DC
Start: 2021-03-07 — End: 2021-03-06

## 2021-03-06 MED ORDER — SODIUM CHLORIDE 0.9% FLUSH
3.0000 mL | Freq: Two times a day (BID) | INTRAVENOUS | Status: DC
  Administered 2021-03-08 – 2021-03-19 (×11): 3 mL via INTRAVENOUS

## 2021-03-06 MED ORDER — MIDAZOLAM HCL 2 MG/2ML IJ SOLN
INTRAMUSCULAR | Status: DC | PRN
  Administered 2021-03-06: 1 mg via INTRAVENOUS

## 2021-03-06 MED ORDER — LIDOCAINE HCL (PF) 1 % IJ SOLN
INTRAMUSCULAR | Status: DC | PRN
  Administered 2021-03-06: 5 mL

## 2021-03-06 MED ORDER — SODIUM CHLORIDE 0.9 % IV SOLN
0.0600 mg/kg/h | INTRAVENOUS | Status: DC
  Administered 2021-03-06 – 2021-03-10 (×5): 0.05 mg/kg/h via INTRAVENOUS
  Administered 2021-03-12 – 2021-03-16 (×6): 0.06 mg/kg/h via INTRAVENOUS
  Filled 2021-03-06 (×14): qty 250

## 2021-03-06 MED ORDER — LABETALOL HCL 5 MG/ML IV SOLN: 10.0000 mg | INTRAVENOUS | Status: AC | PRN

## 2021-03-06 MED ORDER — ANTICOAGULANT SODIUM CITRATE 4% (200MG/5ML) IV SOLN
5.0000 mL | Freq: Once | Status: AC
  Administered 2021-03-06: 5 mL
  Filled 2021-03-06 (×2): qty 5

## 2021-03-06 MED ORDER — MIDAZOLAM HCL 2 MG/2ML IJ SOLN
INTRAMUSCULAR | Status: AC
  Filled 2021-03-06: qty 2

## 2021-03-06 SURGICAL SUPPLY — 7 items
CATH SWAN GANZ VIP 7.5F (CATHETERS) ×4 IMPLANT
KIT MICROPUNCTURE NIT STIFF (SHEATH) ×4 IMPLANT
PACK CARDIAC CATHETERIZATION (CUSTOM PROCEDURE TRAY) ×8 IMPLANT
SHEATH PINNACLE 8F 10CM (SHEATH) ×4 IMPLANT
SHEATH PROBE COVER 6X72 (BAG) ×4 IMPLANT
TRANSDUCER W/STOPCOCK (MISCELLANEOUS) ×4 IMPLANT
TUBING ART PRESS 72  MALE/MALE (TUBING) ×4 IMPLANT

## 2021-03-06 NOTE — Plan of Care (Signed)
  Problem: Education: Goal: Knowledge of General Education information will improve Description: Including pain rating scale, medication(s)/side effects and non-pharmacologic comfort measures Outcome: Progressing   Problem: Health Behavior/Discharge Planning: Goal: Ability to manage health-related needs will improve Outcome: Progressing   Problem: Clinical Measurements: Goal: Ability to maintain clinical measurements within normal limits will improve Outcome: Progressing Goal: Will remain free from infection Outcome: Progressing Goal: Diagnostic test results will improve Outcome: Progressing Goal: Respiratory complications will improve Outcome: Progressing Goal: Cardiovascular complication will be avoided Outcome: Progressing   Problem: Activity: Goal: Risk for activity intolerance will decrease Outcome: Progressing   Problem: Coping: Goal: Level of anxiety will decrease Outcome: Progressing   Problem: Elimination: Goal: Will not experience complications related to bowel motility Outcome: Progressing Goal: Will not experience complications related to urinary retention Outcome: Progressing   Problem: Pain Managment: Goal: General experience of comfort will improve Outcome: Progressing   Problem: Safety: Goal: Ability to remain free from injury will improve Outcome: Progressing   Problem: Skin Integrity: Goal: Risk for impaired skin integrity will decrease Outcome: Progressing   Problem: Education: Goal: Knowledge of disease and its progression will improve Outcome: Progressing   Problem: Health Behavior/Discharge Planning: Goal: Ability to manage health-related needs will improve Outcome: Progressing   Problem: Clinical Measurements: Goal: Complications related to the disease process or treatment will be avoided or minimized Outcome: Progressing Goal: Dialysis access will remain free of complications Outcome: Progressing   Problem: Activity: Goal: Activity  intolerance will improve Outcome: Progressing   Problem: Fluid Volume: Goal: Fluid volume balance will be maintained or improved Outcome: Progressing   Problem: Nutritional: Goal: Ability to make appropriate dietary choices will improve Outcome: Progressing   Problem: Respiratory: Goal: Respiratory symptoms related to disease process will be avoided Outcome: Progressing   Problem: Self-Concept: Goal: Body image disturbance will be avoided or minimized Outcome: Progressing   Problem: Urinary Elimination: Goal: Progression of disease will be identified and treated Outcome: Progressing

## 2021-03-06 NOTE — Progress Notes (Signed)
ANTICOAGULATION CONSULT NOTE - Follow Up Consult  Pharmacy Consult for heparin to bivalirudin Indication: atrial fibrillation  Labs: Recent Labs    03/04/21 0112 03/04/21 1214 03/04/21 1222 03/04/21 1222 03/05/21 0029 03/05/21 1734 03/05/21 2105 03/06/21 0024  HGB  --   --  11.5*   < > 11.2*  --   --  10.6*  HCT  --   --  36.1*  --  34.4*  --   --  33.4*  PLT  --   --  52*  --  55*  --   --  41*  APTT  --   --   --   --   --  75* 74* 77*  HEPARINUNFRC 0.33  --   --   --  0.35  --   --   --   CREATININE 5.03* 6.39*  --   --  7.36*  --   --  7.08*   < > = values in this interval not displayed.     Assessment: 35yo male on heparin drip for AFib now in SR on amiodarone  with heparin level 0.35 therapeutic at goal on heparin 3200 uts/hr, no infusion issues or signs of bleeding per RN, h/h stable pltc fell yesterday but stable 50-60s.  HIT Ab positive, check SRA and started bivalirudin. Will utilize actual body weight as recommended by [employer]rom[employer] and use renal adjusted dose for RRT. Bivalirudin 0.05 mg/kg/hr aptt 77sec at goal h/h stable  Planned tunneled HD cath today  Goal of Therapy:  aPTT 50-85 seconds   Plan:  Stop all heparin - allergy added Bivalirudin 0.'05mg'$ /kg/h Check aPTT daily Follow up restart bival after IR today   Bonnita Nasuti Pharm.D. CPP, BCPS Clinical Pharmacist [phone] 03/06/2021 10:28 AM

## 2021-03-06 NOTE — Interval H&P Note (Signed)
History and Physical Interval Note:  03/06/2021 1:22 PM  Phillip Young  has presented today for surgery, with the diagnosis of chest pain.  The various methods of treatment have been discussed with the patient and family. After consideration of risks, benefits and other options for treatment, the patient has consented to  Procedure(s): RIGHT HEART CATH (N/A) Phillip Young as a surgical intervention.  The patient's history has been reviewed, patient examined, no change in status, stable for surgery.  I have reviewed the patient's chart and labs.  Questions were answered to the patient's satisfaction.     Vergie Zahm

## 2021-03-06 NOTE — Plan of Care (Signed)
  Problem: Education: Goal: Knowledge of General Education information will improve Description: Including pain rating scale, medication(s)/side effects and non-pharmacologic comfort measures Outcome: Progressing   Problem: Coping: Goal: Level of anxiety will decrease Outcome: Progressing   Problem: Pain Managment: Goal: General experience of comfort will improve Outcome: Progressing   

## 2021-03-06 NOTE — Progress Notes (Signed)
Patient ID: Phillip Young, male   DOB: 04-Dec-1985, 35 y.o.   MRN: [id number]     Advanced Heart Failure Rounding Note  PCP-Cardiologist: Dr. [name]  Subjective:    Remains on NE at 6 and dobutamine at 7.5.    Underwent first session of iHD yesterday and able to tolerate without any increase in pressor dose.  Removed 2L of fluid with subsequent weight reduction of ~5 lbs.   CVP of 14-15 this morning. Unable to obtain co-ox due to malfunctioning line.  Now HIT-positive -- switched to bivalirudin yesterday.  SRA pending.  Nephrology placed order for tunneled catheter in IR today with removal of current HD line; however, IR declined due to leukocytosis.  Will replace line in the IJ in the cath lab today.  He is lying in bed with shortness of breath and is now requiring 15L of oxygen again.  He denies abdominal pain, nausea and chest pain.  Objective:   Weight Range: (!) 194.5 kg Body mass index is 59.8 kg/m.   Vital Signs:   Temp:  [97.3 F (36.3 C)-98.2 F (36.8 C)] 98.2 F (36.8 C) (08/12 0600) Pulse Rate:  [72-155] 77 (08/12 0600) Resp:  [18-41] 35 (08/12 0600) BP: (80-152)/(30-102) 119/84 (08/12 0600) SpO2:  [89 %-100 %] 96 % (08/12 0600) Weight:  [194.5 kg-196.7 kg] 194.5 kg (08/11 1530) Last BM Date: 03/05/21  Weight change: Filed Weights   03/05/21 0500 03/05/21 1200 03/05/21 1530  Weight: (!) 193.9 kg (!) 196.7 kg (!) 194.5 kg    Intake/Output:   Intake/Output Summary (Last 24 hours) at 03/06/2021 0654 Last data filed at 03/06/2021 0600 Gross per 24 hour  Intake 2540.08 ml  Output 2100 ml  Net 440.08 ml       Physical Exam   CVP: 14-15 Obese male lying in bed.   HEENT: normal Neck: supple. + HD cath.  Carotids 2+ bilat; no bruits. No lymphadenopathy or thryomegaly appreciated. Cor: PMI nondisplaced. Regular rate & rhythm.Distant HS Lungs: Diminished throughout Abdomen: markedly obese soft, nontender, nondistended. No hepatosplenomegaly. No bruits or  masses. Good bowel sounds. Extremities: no cyanosis, clubbing, rash, 1 + edema Neuro: alert & orientedx3, cranial nerves grossly intact. moves all 4 extremities w/o difficulty.  Affect pleasant.  Telemetry    NSR in 80s.  Personally reviewed   Labs    CBC Recent Labs    03/05/21 0029 03/06/21 0024  WBC 22.5* 22.9*  HGB 11.2* 10.6*  HCT 34.4* 33.4*  MCV 91.2 92.8  PLT 55* 41*    Basic Metabolic Panel Recent Labs    03/05/21 0029 03/06/21 0024  NA 120* 121*  K 4.7 4.3  CL 85* 87*  CO2 19* 19*  GLUCOSE 95 134*  BUN 52* 47*  CREATININE 7.36* 7.08*  CALCIUM 9.2 8.3*  MG 2.7* 2.5*  PHOS 6.4* 5.7*    Liver Function Tests Recent Labs    03/05/21 0029 03/06/21 0024  ALBUMIN 2.7* 2.6*     No results for input(s): LIPASE, AMYLASE in the last 72 hours. Cardiac Enzymes No results for input(s): CKTOTAL, CKMB, CKMBINDEX, TROPONINI in the last 72 hours.  BNP: BNP (last 3 results) Recent Labs    10/10/20 1336 11/07/20 0932 02/11/21 1500  BNP 126.9* 106.0* 194.3*     ProBNP (last 3 results) No results for input(s): PROBNP in the last 8760 hours.   D-Dimer No results for input(s): DDIMER in the last 72 hours.  Hemoglobin A1C No results for input(s): HGBA1C in the last  72 hours.  Fasting Lipid Panel No results for input(s): CHOL, HDL, LDLCALC, TRIG, CHOLHDL, LDLDIRECT in the last 72 hours. Thyroid Function Tests No results for input(s): TSH, T4TOTAL, T3FREE, THYROIDAB in the last 72 hours.  Invalid input(s): FREET3   Other results:   Imaging    No results found.   Medications:     Scheduled Medications:  amiodarone  400 mg Oral BID   Chlorhexidine Gluconate Cloth  6 each Topical Daily   Chlorhexidine Gluconate Cloth  6 each Topical Q0600   insulin aspart  0-5 Units Subcutaneous QHS   insulin aspart  0-9 Units Subcutaneous TID WC   mouth rinse  15 mL Mouth Rinse BID   multivitamin  1 tablet Oral QHS   senna-docusate  2 tablet Oral BID    sodium chloride flush  10-40 mL Intracatheter Q12H   sodium chloride flush  3 mL Intravenous Q12H    Infusions:  sodium chloride Stopped (02/27/21 1651)   sodium chloride     sodium chloride     bivalirudin (ANGIOMAX) infusion 0.5 mg/mL (Non-ACS indications) 0.05 mg/kg/hr (03/06/21 0600)   DOBUTamine 7.5 mcg/kg/min (03/06/21 0600)   norepinephrine (LEVOPHED) Adult infusion 6 mcg/min (03/06/21 0600)    PRN Medications: sodium chloride, sodium chloride, sodium chloride, acetaminophen **OR** acetaminophen, alteplase, diphenhydrAMINE, guaiFENesin-dextromethorphan, lidocaine (PF), lidocaine-prilocaine, menthol-cetylpyridinium, ondansetron (ZOFRAN) IV, oxyCODONE, pentafluoroprop-tetrafluoroeth, sodium chloride, sodium chloride flush, sodium chloride flush, sorbitol  Assessment/Plan   1. Acute on Chronic Biventricular Heart Failure -> cardiogenic shock:  Known NICM since 2019, previously followed by [facility].  Echo 8/21 EF < 20%, moderate LV dilation, severely decreased RV function, severe biatrial enlargement, mild MR. No history of ETOH/drugs.  No FH of cardiomyopathy (mother with [place]).  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  He is too large for cardiac MRI. RHC/LHC on 03/04/20 with no CAD low CI at 2.1. Evaluated by EP but not a candidate for ICD given BMI.  Echo this admission with EF < 20%, RV poorly visualized. He now has CHF exacerbation/shock likely in setting of AFL with RVR.  He was hypotensive with poor UOP, concern for cardiogenic shock.  Dobutamine therefore started at 2.5 and increased to 7.5.  Failed NE wean, added back 08/07.  CVP improved to 14-15 - With progressive renal failure, CVVHD started.  However, catheter clotted on 8/9 and unable to resume.  iHD started on 8/11 -- tolerated without the need to increase pressor dose. Weight down 5 lbs with 2L of volume removal  - Discussed with patient on 8/10 that renal recovery is  unlikely.  He is aware that if he is unable to tolerate iHD due to hypotension without pressors, his only option is hospice  - Fluid restriction has been an issue, but over the past 24 hours, his intake was considerably lower and within the 1200 mL limit.  He expressed remorse over his noncompliance with the prescribed restriction - BID Lasix resumed by nephrology to start 8/10, now discontinued due to anuria  - Remains in NSR after DCCV on 02/27/2021.  - With AKI/hypotension, stopped Entresto, dapagliflozin, spironolactone, digoxin.  - With marked obesity, he is not a candidate for advanced therapies.   2. Aflutter w/ RVR:  Noted to be in Afib during a prior admission in 3/22 in the setting of CAP and a/c CHF, but converted to NSR on amiodarone. Discharged home w/ eliquis. Was in 2:1 AFL this admit in setting of a/c CHF w/ marked volume overload, +  recent gastroenteritis w/ diarrhea. Has OSA but not using CPAP. TSH WNL. Admits to intermittent compliance w/ Eliquis. Has missed several doses in the last month. In NSR since DCCV on 8/5. - Continue PO amiodarone  - Heparin switched to bivalirudin due to HIT   3. AKI: Creatinine now up to >7.Concern for cardiorenal syndrome.  Renal US with no hydronephrosis.   - CVVHD stopped on 8/9 after catheter clotted.  iHD started on 8/11 with 2L of volume removed.  Next session planned for 8/13  - Initially planned for tunneled catheter in IR, but IR declined due to leukocytosis   4. Acute Hypoxic Respiratory Failure: Suspect underlying OHS/OSA exacerbated by CHF this admission.  - Continue oxygen + CPAP. Now on 15L of oxygen - remains tenuous   5. Type 2 DM: Farxiga held with AKI.   6. OSA: Has not been on Bipap at home.   7. Super Morbid Obesity: Body mass index is 59.8 kg/m.  8. UTI: Suspect by UA, sent urine culture.  ?Urinary obstruction related to this => no hydronephrosis on renal US.  - Ceftriaxone IV course completed.   9. Hyponatremia: Na  121 - iHD started on 8/11. S/p CVVHD   10. Leukocytosis: WBC elevated to 23, but stable - Afebrile - BCx NGTD. Repeat cultures ordered on 8/12 by nephrology; however, unable to obtain blood  - Chest x-ray stable 08/07  11. Heparin-induced thrombocytopenia  - Positive for HIT on 8/11 -- heparin switched to bivalirudin  - Plt count down to 41 on 8/12  - SRA pending   GOC: -Palliative care following -Currently full code -Discussed with patient that likelihood that his renal function will not recover and that the next "test" is whether he can tolerate iHD without pressor support  -Not a candidate for advanced therapies. - Now expressing remorse for his behavior toward staff and for his noncompliance with treatment regimen   Length of Stay: [place], NP  03/06/2021, 6:54 AM  Advanced Heart Failure Team Pager [phone] (M-F; 7a - 5p)   Agree with above.   Tolerated iHD with NE and DBA support. Weight down 5 pounds. Breathing better. Switched to bival for possible HIT. PLTs 41k this am   HD cath not working properly.   General:  Sitting up on side of bed. No resp difficulty HEENT: normal Neck: supple. RIJ HD cath partially pulled back Carotids 2+ bilat; no bruits. No lymphadenopathy or thryomegaly appreciated. Cor: PMI nondisplaced. Regular rate & rhythm. No rubs, gallops or murmurs. Lungs: crackles at basese Abdomen: obese soft, nontender, nondistended. No hepatosplenomegaly. No bruits or masses. Good bowel sounds. Extremities: no cyanosis, clubbing, rash, tr edema Neuro: alert & orientedx3, cranial nerves grossly intact. moves all 4 extremities w/o difficulty. Affect pleasant  Remains anuric. Tolerating iHD with DBA and NE support. Volume status improving. Will need to start weaning inotropes to see if he can tolerate iHD without inotrope support.   Will go to cath lab today to move Trialysis cath to [facility] and will do RHC at same time to assess hemodynamics.   Continue  Bival. Await SRA.   CRITICAL CARE Performed by: Glori Bickers  Total critical care time: 55 minutes  Critical care time was exclusive of separately billable procedures and treating other patients.  Critical care was necessary to treat or prevent imminent or life-threatening deterioration.  Critical care was time spent personally by me (independent of midlevel providers or residents) on the following activities: development of treatment  plan with patient and/or surrogate as well as nursing, discussions with consultants, evaluation of patient's response to treatment, examination of patient, obtaining history from patient or surrogate, ordering and performing treatments and interventions, ordering and review of laboratory studies, ordering and review of radiographic studies, pulse oximetry and re-evaluation of patient's condition.  Glori Bickers, MD  1:26 PM

## 2021-03-06 NOTE — Progress Notes (Signed)
Patient ID: Phillip Young, male   DOB: 1985/10/31, 35 y.o.   MRN: [id number] [facility] Progress Note   Assessment/ Plan:   1. Acute kidney Injury: Secondary to cardiogenic shock/hemodynamically mediated in the setting of atrial flutter with RVR and underlying severe biventricular congestive heart failure.  As of 8/9 he is down 73 lbs since admission.  High PO intake has been a struggle.  CRRT stopped on 8/9 and s/p iHD on 8/11. - HD tomorrow on 8/13 - It does not appear that his dialysis catheter is functioning well - nursing order to remove placed - Making NPO for tunneled dialysis catheter today if possible - Consulting IR for tunneled dialysis catheter - Note that for planning, as documented he would need to transition to hospice if he is not able to tolerate intermittent hemodialysis.  Defer additional CRRT as documented by myself and CHF team - reinstated fluid restriction of 1.2 liters/day - Will discuss line placement timing with CHF as well   2.  Hyponatremia: Secondary to CHF/impaired free water handling with poorly restricted free water intake.  For HD as above  3.  Hyperkalemia: improved with RRT.   4.  Biventricular congestive heart failure: s/p CRRT for volume unloading after refractory to diuretics.  Unfortunately not a candidate for advanced therapies per CHF charting.  Not a candidate for additional CRRT  5.  Atrial flutter with RVR: He underwent cardioversion and is on amiodarone   6. Anemia normocytic - mild; no indication for ESA  7. Hyperphosphatemia - follow with HD. Change to renal diet. Some improvement   8. Leukocytosis  - Sending blood cultures.  Note 8/3 blood cultures Negative   9. Thrombocytopenia - not on heparin with HD; not HIT ab elevated and now on bivalrudin per CHF  Subjective:   He was anuric over 8/11 with 100 mL UOP charted.  He had 2 kg UF on 8/11 with first iHD treatment. He has been on levo at 6 mcg/min and dobutamine.  He  states thankful that first HD went ok.  He hasn't eaten since midnight.  He really cut down on fluid yesterday he states.  Review of systems:   denies shortness of breath  Denies n/v No chest pain    Objective:   BP 95/60   Pulse 75   Temp (!) 97.5 F (36.4 C) (Axillary)   Resp (!) 22   Ht '5\' 11"'$  (1.803 m)   Wt (!) 194.5 kg   SpO2 100%   BMI 59.80 kg/m   Intake/Output Summary (Last 24 hours) at 03/06/2021 [id number] Last data filed at 03/06/2021 0400 Gross per 24 hour  Intake 2413.93 ml  Output 2100 ml  Net 313.93 ml   Weight change: 2.8 kg  Physical Exam:   General adult male in bed in no acute distress  HEENT normocephalic atraumatic extraocular movements intact sclera anicteric Neck supple trachea midline Lungs clear but reduced to auscultation bilaterally normal work of breathing at rest; on 11 liters oxygen Heart S1S2 no rub Abdomen soft nontender obese habitus  Extremities no edema lower extremities Psych normal mood and affect Access - RIJ nontunneled dialysis catheter   Imaging: No results found.  Labs: BMET Recent Labs  Lab 03/02/21 0615 03/02/21 1552 03/03/21 0031 03/03/21 1559 03/04/21 0112 03/04/21 1214 03/05/21 0029 03/06/21 0024  NA 125* 128* 126* 124* 120* 120* 120* 121*  K 4.1 4.2 4.0 4.4 4.8 4.5 4.7 4.3  CL 91* 93* 90* 89* 87* 87* 85* 87*  CO2 23 21* 20* 19* 18* 18* 19* 19*  GLUCOSE 108* 113* 99 163* 91 98 95 134*  BUN 21* 22* 22* 25* 32* 41* 52* 47*  CREATININE 3.40* 3.47* 3.55* 4.03* 5.03* 6.39* 7.36* 7.08*  CALCIUM 8.5* 8.7* 8.8* 9.2 8.9 9.1 9.2 8.3*  PHOS 2.2* 3.2 3.2 2.9 3.7  --  6.4* 5.7*   CBC Recent Labs  Lab 03/03/21 0031 03/04/21 1222 03/05/21 0029 03/06/21 0024  WBC 22.8* 23.5* 22.5* 22.9*  HGB 12.6* 11.5* 11.2* 10.6*  HCT 38.5* 36.1* 34.4* 33.4*  MCV 90.8 90.9 91.2 92.8  PLT 61* 52* 55* 41*    Medications:     amiodarone  400 mg Oral BID   Chlorhexidine Gluconate Cloth  6 each Topical Daily   Chlorhexidine  Gluconate Cloth  6 each Topical Q0600   insulin aspart  0-5 Units Subcutaneous QHS   insulin aspart  0-9 Units Subcutaneous TID WC   mouth rinse  15 mL Mouth Rinse BID   multivitamin  1 tablet Oral QHS   senna-docusate  2 tablet Oral BID   sodium chloride flush  10-40 mL Intracatheter Q12H   sodium chloride flush  3 mL Intravenous Q12H   Claudia Desanctis, MD 03/06/2021, 6:01 AM

## 2021-03-06 NOTE — Progress Notes (Signed)
PT Cancellation Note  Patient Details Name: Phillip Young MRN: [id number] DOB: 1986/07/07   Cancelled Treatment:    Reason Eval/Treat Not Completed: Patient not medically ready (Pt with creatnine 7.08, SOB on 11L and discussed with RN who stated to hold today based on medical issues and need for central line)   Julissa Browning B Eveleigh Crumpler 03/06/2021, 9:00 AM Bayard Males, PT Acute Rehabilitation Services Pager: [phone] Office: [phone]

## 2021-03-06 NOTE — Progress Notes (Signed)
Phlebotomy has attempted to obtain blood cultures x 2 without success.

## 2021-03-06 NOTE — TOC CM/SW Note (Signed)
HF TOC CM spoke to [place] and they will assist with Trilogy/NIV at dc, attending updated. Will need insurance narrative explained need and benefit for DME in the home. Pt had Trilogy in March and turned device in due to copay cost. Per pt's [employer] CM, he has met out of pocket deductibles and should not have copay for NIV. [employer] will check benefits for DME.   [place], Heart Failure TOC CM [phone]

## 2021-03-07 ENCOUNTER — Inpatient Hospital Stay (HOSPITAL_COMMUNITY): Payer: BC Managed Care – PPO

## 2021-03-07 DIAGNOSIS — I5023 Acute on chronic systolic (congestive) heart failure: Secondary | ICD-10-CM | POA: Diagnosis not present

## 2021-03-07 LAB — CBC
HCT: 30.7 % — ABNORMAL LOW (ref 39.0–52.0)
Hemoglobin: 9.7 g/dL — ABNORMAL LOW (ref 13.0–17.0)
MCH: 29.6 pg (ref 26.0–34.0)
MCHC: 31.6 g/dL (ref 30.0–36.0)
MCV: 93.6 fL (ref 80.0–100.0)
Platelets: 79 10*3/uL — ABNORMAL LOW (ref 150–400)
RBC: 3.28 MIL/uL — ABNORMAL LOW (ref 4.22–5.81)
RDW: 17.6 % — ABNORMAL HIGH (ref 11.5–15.5)
WBC: 17.2 10*3/uL — ABNORMAL HIGH (ref 4.0–10.5)
nRBC: 0.2 % (ref 0.0–0.2)

## 2021-03-07 LAB — GLUCOSE, CAPILLARY
Glucose-Capillary: 116 mg/dL — ABNORMAL HIGH (ref 70–99)
Glucose-Capillary: 114 mg/dL — ABNORMAL HIGH (ref 70–99)
Glucose-Capillary: 141 mg/dL — ABNORMAL HIGH (ref 70–99)
Glucose-Capillary: 115 mg/dL — ABNORMAL HIGH (ref 70–99)

## 2021-03-07 LAB — APTT: aPTT: 62 seconds — ABNORMAL HIGH (ref 24–36)

## 2021-03-07 LAB — MAGNESIUM: Magnesium: 2.4 mg/dL (ref 1.7–2.4)

## 2021-03-07 LAB — RENAL FUNCTION PANEL
Albumin: 2.7 g/dL — ABNORMAL LOW (ref 3.5–5.0)
Anion gap: 15 (ref 5–15)
BUN: 61 mg/dL — ABNORMAL HIGH (ref 6–20)
CO2: 18 mmol/L — ABNORMAL LOW (ref 22–32)
Calcium: 8.4 mg/dL — ABNORMAL LOW (ref 8.9–10.3)
Chloride: 86 mmol/L — ABNORMAL LOW (ref 98–111)
Creatinine, Ser: 8.56 mg/dL — ABNORMAL HIGH (ref 0.61–1.24)
GFR, Estimated: 8 mL/min — ABNORMAL LOW (ref >60)
Glucose, Bld: 110 mg/dL — ABNORMAL HIGH (ref 70–99)
Phosphorus: 6.6 mg/dL — ABNORMAL HIGH (ref 2.5–4.6)
Potassium: 4.2 mmol/L (ref 3.5–5.1)
Sodium: 119 mmol/L — CL (ref 135–145)

## 2021-03-07 LAB — COOXEMETRY PANEL
Carboxyhemoglobin: 1.4 % (ref 0.5–1.5)
Methemoglobin: 0.9 % (ref 0.0–1.5)
O2 Saturation: 62.6 %
Total hemoglobin: 9.9 g/dL — ABNORMAL LOW (ref 12.0–16.0)

## 2021-03-07 IMAGING — DX DG CHEST 1V PORT
1 series · 2 of 2 positions shown · non-contrast
Comparison: Chest x-ray dated [DATE].

CLINICAL DATA: Central line placement

EXAM:
PORTABLE CHEST 1 VIEW

[Series 1: chest · 0.14mm/px · 2 of 2 slices shown]
[im 1/2]
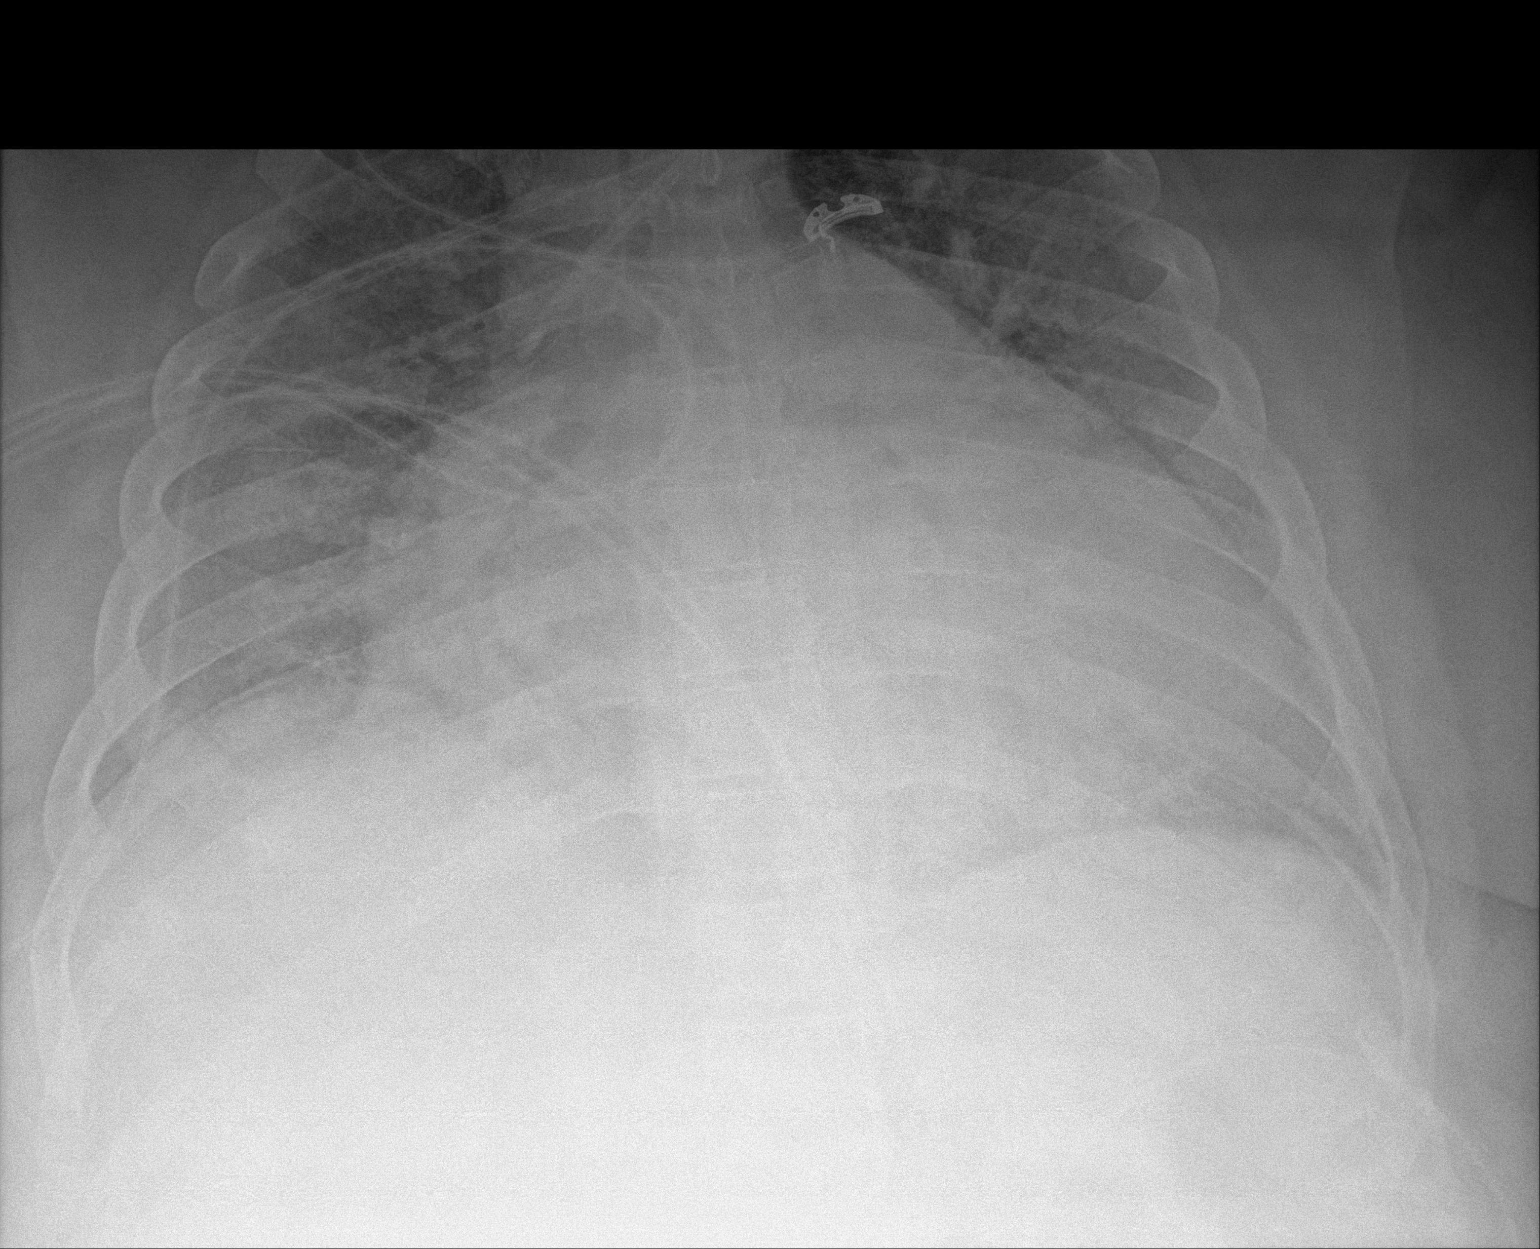
[im 2/2]
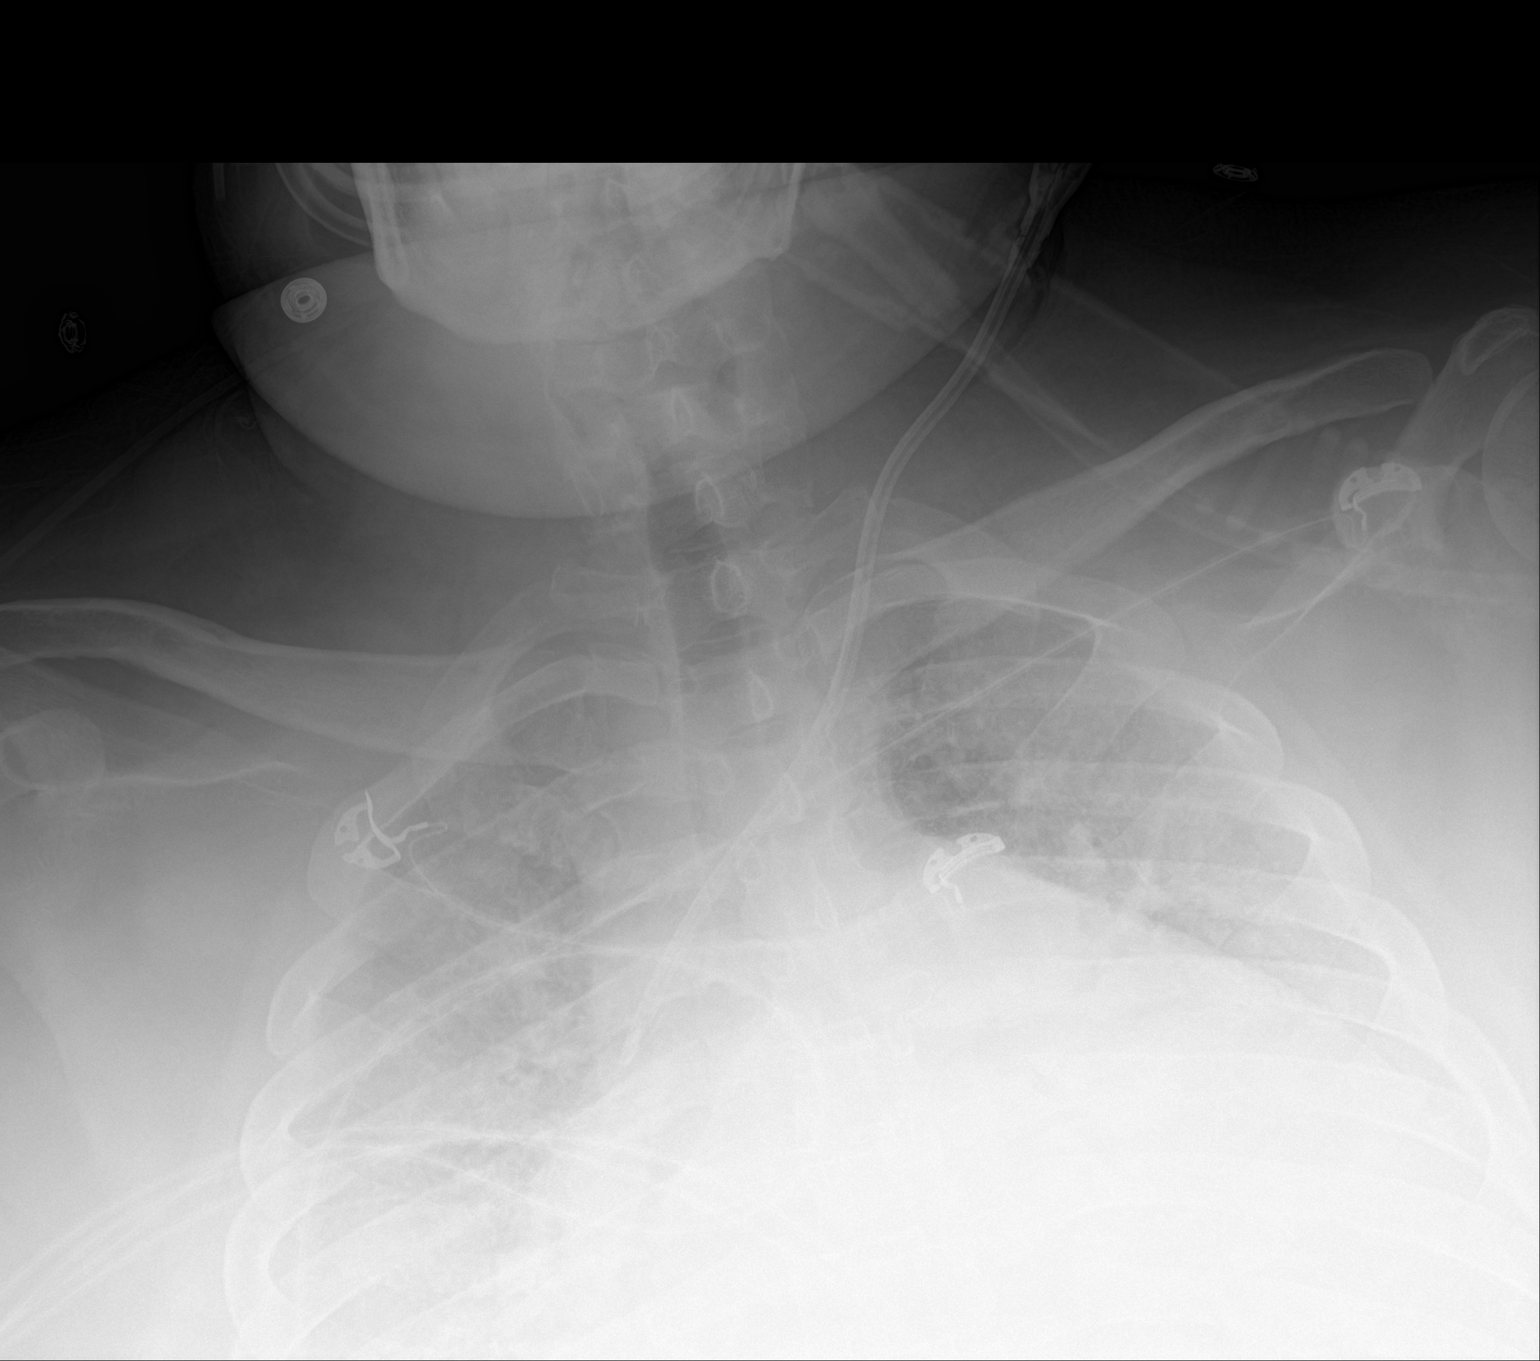

[2 of 2 positions shown; findings below may reference images not displayed]

FINDINGS: New LEFT IJ central line with tip adequately positioned at the level
of the mid/upper SVC. Stable marked cardiomegaly. Central pulmonary
vascular congestion and bilateral interstitial prominence, grossly
stable. No pneumothorax is seen.
IMPRESSION: 1. New LEFT IJ central line with tip adequately positioned at the
level of the mid/upper SVC. No pneumothorax seen.
2. Stable marked cardiomegaly with CHF/volume overload.

## 2021-03-07 MED ORDER — ANTICOAGULANT SODIUM CITRATE 4% (200MG/5ML) IV SOLN
5.0000 mL | Freq: Once | Status: AC
  Administered 2021-03-07: 5 mL
  Filled 2021-03-07: qty 5

## 2021-03-07 MED ORDER — TORSEMIDE 20 MG PO TABS
80.0000 mg | ORAL_TABLET | Freq: Once | ORAL | Status: AC
  Administered 2021-03-07: 80 mg via ORAL
  Filled 2021-03-07: qty 4

## 2021-03-07 NOTE — Progress Notes (Addendum)
Patient ID: Phillip Young, male   DOB: 02/02/1986, 35 y.o.   MRN: [id number] [facility] Progress Note   Assessment/ Plan:   1. Acute kidney Injury: Secondary to cardiogenic shock/hemodynamically mediated in the setting of atrial flutter with RVR and underlying severe biventricular congestive heart failure.  As of 8/9 he is down 73 lbs since admission.  High PO intake has been a struggle.  CRRT stopped on 8/9 and s/p iHD on 8/11.  Dialysis nontunneled catheter not functioning well and was replaced on 8/12 per Dr. Haroldine Laws - appreciate his assistance. UOP up a bit - HD today then assess needs daily.  Plan next tx on 8/15 after that - Will get a tunneled catheter with IR early next week (IR has been consulted).  Blood cx's pending - Note that for planning, as documented he would need to transition to hospice if he is not able to tolerate intermittent hemodialysis.  Defer additional CRRT as documented by myself and CHF team - On fluid restriction of 1.2 liters/day - torsemide 80 mg PO once  - lasix IV vs. Torsemide tomorrow   2.  Hyponatremia: Secondary to CHF/impaired free water handling with poorly restricted free water intake.  Also with poorly funcitoning HD catheter.  For HD as above  3.  Hyperkalemia: improved with RRT.   4.  Biventricular congestive heart failure: s/p CRRT for volume unloading after refractory to diuretics.  Unfortunately not a candidate for advanced therapies per CHF charting.  Not a candidate for additional CRRT. Transitioned to iHD to manage volume  5.  Atrial flutter with RVR: He underwent cardioversion and is on amiodarone   6. Anemia normocytic - mild; no indication for ESA  7. Hyperphosphatemia - follow with HD and now s/p exchange of catheter. Changed to renal diet. Some improvement   8. Leukocytosis  - blood cultures resent 8/12.  Note 8/3 blood cultures Negative  - slightly improved  9. Thrombocytopenia - not on heparin with HD; note HIT ab  elevated and now on bivalrudin per CHF - improved  Subjective:   He had 375 mL uop over 8/12.  Last HD on 8/11 with 2 kg UF on 8/11 (his first iHD treatment).  He has been on levo at 4 mcg/min and dobutamine.  States he appreciates the teams.  He states he has responded well to torsemide over IV lasix and asks if we would consider this instead.  Review of systems:   Some shortness of breath  Denies n/v No chest pain  Urinating more and this has encouraged him   Objective:   BP (!) 105/49   Pulse 72   Temp (!) 97.4 F (36.3 C) (Axillary)   Resp (!) 24   Ht '5\' 11"'$  (1.803 m)   Wt (!) 194.5 kg   SpO2 98%   BMI 59.80 kg/m   Intake/Output Summary (Last 24 hours) at 03/07/2021 0548 Last data filed at 03/07/2021 0500 Gross per 24 hour  Intake 2629.55 ml  Output 375 ml  Net 2254.55 ml   Weight change:   Physical Exam:   General adult male in bed in no acute distress  HEENT normocephalic atraumatic extraocular movements intact sclera anicteric Neck supple trachea midline Lungs clear but reduced to auscultation bilaterally normal work of breathing at rest; on CPAP Heart S1S2 no rub Abdomen soft nontender obese habitus  Extremities trace edema lower extremities Psych normal mood and affect Access - left IJ nontunneled dialysis catheter   Imaging: CARDIAC CATHETERIZATION  Result Date: 03/06/2021 Findings: On DBA 7.5 and NE 5 RA = 17 RV = 53/19 PA = 57/18 (36) PCW = 23 Fick cardiac output/index = 6.0/2.1 Thermo CO/CI = 8.9/3.0 PVR = 2.2 FA sat = 98% PA sat = 52%, 54% PaPI = 2.3 RA/PCW = 0.74 Assessment: 1. Biventricular failure with elevated pressures R>L 2. Preserved CO with pressor support 3. Successful LIJ trialysis catheter placement Plan/Discussion: Continue iHD. Wean inotropes as tolerated. Glori Bickers, MD 2:33 PM   PERIPHERAL VASCULAR CATHETERIZATION  Result Date: 03/06/2021 Findings: On DBA 7.5 and NE 5 RA = 17 RV = 53/19 PA = 57/18 (36) PCW = 23 Fick cardiac  output/index = 6.0/2.1 Thermo CO/CI = 8.9/3.0 PVR = 2.2 FA sat = 98% PA sat = 52%, 54% PaPI = 2.3 RA/PCW = 0.74 Assessment: 1. Biventricular failure with elevated pressures R>L 2. Preserved CO with pressor support 3. Successful LIJ trialysis catheter placement Plan/Discussion: Continue iHD. Wean inotropes as tolerated. Glori Bickers, MD 2:33 PM    Labs: BMET Recent Labs  Lab 03/02/21 1552 03/03/21 0031 03/03/21 1559 03/04/21 0112 03/04/21 1214 03/05/21 0029 03/06/21 0024 03/06/21 1403 03/06/21 1404 03/07/21 0400  NA 128* 126* 124* 120* 120* 120* 121* 125* 122* 119*  K 4.2 4.0 4.4 4.8 4.5 4.7 4.3 4.4 4.8 4.2  CL 93* 90* 89* 87* 87* 85* 87*  --   --  86*  CO2 21* 20* 19* 18* 18* 19* 19*  --   --  18*  GLUCOSE 113* 99 163* 91 98 95 134*  --   --  110*  BUN 22* 22* 25* 32* 41* 52* 47*  --   --  61*  CREATININE 3.47* 3.55* 4.03* 5.03* 6.39* 7.36* 7.08*  --   --  8.56*  CALCIUM 8.7* 8.8* 9.2 8.9 9.1 9.2 8.3*  --   --  8.4*  PHOS 3.2 3.2 2.9 3.7  --  6.4* 5.7*  --   --  6.6*   CBC Recent Labs  Lab 03/04/21 1222 03/05/21 0029 03/06/21 0024 03/06/21 1403 03/06/21 1404 03/07/21 0400  WBC 23.5* 22.5* 22.9*  --   --  17.2*  HGB 11.5* 11.2* 10.6* 10.9* 11.9* 9.7*  HCT 36.1* 34.4* 33.4* 32.0* 35.0* 30.7*  MCV 90.9 91.2 92.8  --   --  93.6  PLT 52* 55* 41*  --   --  79*    Medications:     amiodarone  400 mg Oral BID   Chlorhexidine Gluconate Cloth  6 each Topical Daily   insulin aspart  0-5 Units Subcutaneous QHS   insulin aspart  0-9 Units Subcutaneous TID WC   mouth rinse  15 mL Mouth Rinse BID   multivitamin  1 tablet Oral QHS   senna-docusate  2 tablet Oral BID   sodium chloride flush  10-40 mL Intracatheter Q12H   sodium chloride flush  3 mL Intravenous Q12H   sodium chloride flush  3 mL Intravenous Q12H   Claudia Desanctis, MD 03/07/2021, 6:05 AM

## 2021-03-07 NOTE — Plan of Care (Signed)
  Problem: Clinical Measurements: Goal: Will remain free from infection Outcome: Progressing Goal: Diagnostic test results will improve Outcome: Progressing Goal: Respiratory complications will improve Outcome: Progressing   Problem: Activity: Goal: Risk for activity intolerance will decrease Outcome: Progressing   

## 2021-03-07 NOTE — Progress Notes (Signed)
Patient ID: Phillip Young, male   DOB: 07-14-1986, 35 y.o.   MRN: [id number]     Advanced Heart Failure Rounding Note  PCP-Cardiologist: Dr. [name]  Subjective:    Trialysis cath switched to left IJ yesterday. No issues with site. RIJ cath removed. On bival due to + HIT. SRA pending PLTs improved today  Feels ok today. Denies SOB, orthopnea or PND.  On DBA 7.5. NE weaned off overnight,   Co-ox 63% CVP 17-18. For iHD today. Made 375cc urine yesterday.  Still with high levels of fluid intake -> Na 119  Objective:   Weight Range: (!) 194.5 kg Body mass index is 59.8 kg/m.   Vital Signs:   Temp:  [97.4 F (36.3 C)-98.5 F (36.9 C)] 97.7 F (36.5 C) (08/13 0640) Pulse Rate:  [66-100] 76 (08/13 0830) Resp:  [11-41] 22 (08/13 0830) BP: (66-132)/(38-98) 85/61 (08/13 0830) SpO2:  [90 %-100 %] 100 % (08/13 0830) Weight:  [194.5 kg] 194.5 kg (08/13 0500) Last BM Date: 03/05/21  Weight change: Filed Weights   03/05/21 1530 03/06/21 0500 03/07/21 0500  Weight: (!) 194.5 kg (!) 194.5 kg (!) 194.5 kg    Intake/Output:   Intake/Output Summary (Last 24 hours) at 03/07/2021 0902 Last data filed at 03/07/2021 0700 Gross per 24 hour  Intake 2651 ml  Output 375 ml  Net 2276 ml       Physical Exam   General:  Obese male sitting in chair.  HEENT: normal Neck: supple. LIJ HD cath, RIJ site ok Carotids 2+ bilat; no bruits. No lymphadenopathy or thryomegaly appreciated. Cor: PMI nondisplaced. Regular rate & rhythm. No rubs, gallops or murmurs. Lungs: decreased throughout Abdomen: obese soft, nontender, nondistended. No hepatosplenomegaly. No bruits or masses. Good bowel sounds. Extremities: no cyanosis, clubbing, rash, 1+ edema Neuro: alert & orientedx3, cranial nerves grossly intact. moves all 4 extremities w/o difficulty. Affect pleasant   Telemetry    NSR 70-80s.  Personally reviewed   Labs    CBC Recent Labs    03/06/21 0024 03/06/21 1403 03/06/21 1404 03/07/21 0400   WBC 22.9*  --   --  17.2*  HGB 10.6*   < > 11.9* 9.7*  HCT 33.4*   < > 35.0* 30.7*  MCV 92.8  --   --  93.6  PLT 41*  --   --  79*   < > = values in this interval not displayed.    Basic Metabolic Panel Recent Labs    03/06/21 0024 03/06/21 1403 03/06/21 1404 03/07/21 0400  NA 121*   < > 122* 119*  K 4.3   < > 4.8 4.2  CL 87*  --   --  86*  CO2 19*  --   --  18*  GLUCOSE 134*  --   --  110*  BUN 47*  --   --  61*  CREATININE 7.08*  --   --  8.56*  CALCIUM 8.3*  --   --  8.4*  MG 2.5*  --   --  2.4  PHOS 5.7*  --   --  6.6*   < > = values in this interval not displayed.    Liver Function Tests Recent Labs    03/06/21 0024 03/07/21 0400  ALBUMIN 2.6* 2.7*     No results for input(s): LIPASE, AMYLASE in the last 72 hours. Cardiac Enzymes No results for input(s): CKTOTAL, CKMB, CKMBINDEX, TROPONINI in the last 72 hours.  BNP: BNP (last 3 results) Recent Labs  10/10/20 1336 11/07/20 0932 02/11/21 1500  BNP 126.9* 106.0* 194.3*     ProBNP (last 3 results) No results for input(s): PROBNP in the last 8760 hours.   D-Dimer No results for input(s): DDIMER in the last 72 hours.  Hemoglobin A1C No results for input(s): HGBA1C in the last 72 hours.  Fasting Lipid Panel No results for input(s): CHOL, HDL, LDLCALC, TRIG, CHOLHDL, LDLDIRECT in the last 72 hours. Thyroid Function Tests No results for input(s): TSH, T4TOTAL, T3FREE, THYROIDAB in the last 72 hours.  Invalid input(s): FREET3   Other results:   Imaging    CARDIAC CATHETERIZATION  Result Date: 03/06/2021 Findings: On DBA 7.5 and NE 5 RA = 17 RV = 53/19 PA = 57/18 (36) PCW = 23 Fick cardiac output/index = 6.0/2.1 Thermo CO/CI = 8.9/3.0 PVR = 2.2 FA sat = 98% PA sat = 52%, 54% PaPI = 2.3 RA/PCW = 0.74 Assessment: 1. Biventricular failure with elevated pressures R>L 2. Preserved CO with pressor support 3. Successful LIJ trialysis catheter placement Plan/Discussion: Continue iHD. Wean inotropes  as tolerated. Glori Bickers, MD 2:33 PM   PERIPHERAL VASCULAR CATHETERIZATION  Result Date: 03/06/2021 Findings: On DBA 7.5 and NE 5 RA = 17 RV = 53/19 PA = 57/18 (36) PCW = 23 Fick cardiac output/index = 6.0/2.1 Thermo CO/CI = 8.9/3.0 PVR = 2.2 FA sat = 98% PA sat = 52%, 54% PaPI = 2.3 RA/PCW = 0.74 Assessment: 1. Biventricular failure with elevated pressures R>L 2. Preserved CO with pressor support 3. Successful LIJ trialysis catheter placement Plan/Discussion: Continue iHD. Wean inotropes as tolerated. Glori Bickers, MD 2:33 PM     Medications:     Scheduled Medications:  amiodarone  400 mg Oral BID   Chlorhexidine Gluconate Cloth  6 each Topical Daily   insulin aspart  0-5 Units Subcutaneous QHS   insulin aspart  0-9 Units Subcutaneous TID WC   mouth rinse  15 mL Mouth Rinse BID   multivitamin  1 tablet Oral QHS   senna-docusate  2 tablet Oral BID   sodium chloride flush  10-40 mL Intracatheter Q12H   sodium chloride flush  3 mL Intravenous Q12H   sodium chloride flush  3 mL Intravenous Q12H    Infusions:  sodium chloride Stopped (02/27/21 1651)   sodium chloride     sodium chloride     sodium chloride     bivalirudin (ANGIOMAX) infusion 0.5 mg/mL (Non-ACS indications) 0.05 mg/kg/hr (03/07/21 0600)   DOBUTamine 7.5 mcg/kg/min (03/07/21 0600)   norepinephrine (LEVOPHED) Adult infusion Stopped (03/07/21 0557)    PRN Medications: sodium chloride, sodium chloride, sodium chloride, sodium chloride, acetaminophen **OR** acetaminophen, alteplase, camphor-menthol, diphenhydrAMINE, guaiFENesin-dextromethorphan, lidocaine (PF), lidocaine-prilocaine, menthol-cetylpyridinium, ondansetron (ZOFRAN) IV, oxyCODONE, pentafluoroprop-tetrafluoroeth, sodium chloride, sodium chloride flush, sodium chloride flush, sodium chloride flush, sorbitol  Assessment/Plan   1. Acute on Chronic Biventricular Heart Failure -> cardiogenic shock:  Known NICM since 2019, previously followed by [facility].   Echo 8/21 EF < 20%, moderate LV dilation, severely decreased RV function, severe biatrial enlargement, mild MR. No history of ETOH/drugs.  No FH of cardiomyopathy (mother with [place]).  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  He is too large for cardiac MRI. RHC/LHC on 03/04/20 with no CAD low CI at 2.1. Evaluated by EP but not a candidate for ICD given BMI.  Echo this admission with EF < 20%, RV poorly Dev eloped cardiogenic shock -> AKI in setting of AFL with RVR. Dobutamine started at 2.5  and increased to 7.5.  NE added to support CVVHD. Now down 62 pounds and transitioned to iHD. NE weaned off overnight. Remains on DBA 7.5 CVP 17 co-ox 63. Continues with very high po intake despite extensive counseling,  - Plan iHD today. Will drop DBA to 6 and see if he tolerates - Urine output picking up a little. Test dose of torsemide today per Renal  - With RV failure, goal CVP likely 8-12 range - Not candidate for advanced therapies. This has been explained to him several times. If fails to tolerated iHD off inotopes will need Hospice. This  - With AKI/hypotension, stopped Entresto, dapagliflozin, spironolactone, digoxin.    2. Aflutter w/ RVR:  Noted to be in Afib during a prior admission in 3/22 in the setting of CAP and a/c CHF, but converted to NSR on amiodarone. Discharged home w/ eliquis. Was in 2:1 AFL this admit in setting of a/c CHF w/ marked volume overload, + recent gastroenteritis w/ diarrhea. Has OSA but not using CPAP. TSH WNL. In NSR since DCCV on 8/5. - Continue PO amiodarone  - Heparin switched to bivalirudin due to HIT. SRA pending  3. AKI on CKD IV: Likely ATN/cardiorenal.CVVHD stopped on 8/9 after catheter clotted.  iHD started on 8/11 with 2L of volume removed. Trialysis cath switched 8/12 - For iHD today per Renal - Tunneled cath pending later this week with IR  4. Acute Hypoxic Respiratory Failure: Suspect underlying OHS/OSA  exacerbated by CHF this admission. Still with significant O2 requirement - Continue oxygen + CPAP.   5. Type 2 DM: Farxiga held with AKI.  - continue SSI  6. OSA: Has not been on Bipap at home.   7. Super Morbid Obesity: Body mass index is 59.8 kg/m.  8. UTI: Suspect by UA, sent urine culture.  - Ceftriaxone IV course completed.   9. Hyponatremia: Na 119 - Management with iHD - He has been counseled on free water restriction numerous times  10. Leukocytosis: WBC 23 -> 17k after HD line changed - Afebrile - BCx NGTD. - Chest x-ray stable 08/07  11. Heparin-induced thrombocytopenia  - Positive for HIT on 8/11 -- heparin switched to bivalirudin  - Plt count down to 41 -> 79 - SRA pending    CRITICAL CARE Performed by: Glori Bickers  Total critical care time: 35 minutes  Critical care time was exclusive of separately billable procedures and treating other patients.  Critical care was necessary to treat or prevent imminent or life-threatening deterioration.  Critical care was time spent personally by me (independent of midlevel providers or residents) on the following activities: development of treatment plan with patient and/or surrogate as well as nursing, discussions with consultants, evaluation of patient's response to treatment, examination of patient, obtaining history from patient or surrogate, ordering and performing treatments and interventions, ordering and review of laboratory studies, ordering and review of radiographic studies, pulse oximetry and re-evaluation of patient's condition.   Length of Stay: Kline, MD  03/07/2021, 9:02 AM  Advanced Heart Failure Team Pager [phone] (M-F; 7a - 5p)

## 2021-03-07 NOTE — Progress Notes (Signed)
ANTICOAGULATION CONSULT NOTE - Follow Up Consult  Pharmacy Consult for heparin to bivalirudin Indication: atrial fibrillation  Labs: Recent Labs    03/05/21 0029 03/05/21 1734 03/05/21 2105 03/06/21 0024 03/06/21 1403 03/06/21 1404 03/07/21 0400  HGB 11.2*  --   --  10.6* 10.9* 11.9* 9.7*  HCT 34.4*  --   --  33.4* 32.0* 35.0* 30.7*  PLT 55*  --   --  41*  --   --  79*  APTT  --    < > 74* 77*  --   --  62*  HEPARINUNFRC 0.35  --   --   --   --   --   --   CREATININE 7.36*  --   --  7.08*  --   --  8.56*   < > = values in this interval not displayed.     Assessment: 35yo male on heparin drip for AFib now in SR on amiodarone. Patient was on a heparin gtt, but developed symptoms of HIT and was changed to bivalirudin.   HIT Ab positive, SRA in process and started bivalirudin. Will utilize actual body weight as recommended by [employer] and use renal adjusted dose for CRRT. Bivalirudin 0.05 mg/kg/hr aptt 62 sec at goal (50-85) h/h stable  Platelets trending back up. Up to 79 today.   Goal of Therapy:  aPTT 50-85 seconds   Plan:  Stop all heparin - allergy added Bivalirudin 0.'05mg'$ /kg/h Check aPTT daily  Cathrine Muster, PharmD PGY2 Cardiology Pharmacy Resident Phone: [phone] 03/07/2021  6:36 AM Please check [email or web address] for unit-specific pharmacy phone numbers.

## 2021-03-08 DIAGNOSIS — I5023 Acute on chronic systolic (congestive) heart failure: Secondary | ICD-10-CM | POA: Diagnosis not present

## 2021-03-08 LAB — RENAL FUNCTION PANEL
Albumin: 2.7 g/dL — ABNORMAL LOW (ref 3.5–5.0)
Anion gap: 13 (ref 5–15)
BUN: 69 mg/dL — ABNORMAL HIGH (ref 6–20)
CO2: 20 mmol/L — ABNORMAL LOW (ref 22–32)
Calcium: 8.5 mg/dL — ABNORMAL LOW (ref 8.9–10.3)
Chloride: 88 mmol/L — ABNORMAL LOW (ref 98–111)
Creatinine, Ser: 8.77 mg/dL — ABNORMAL HIGH (ref 0.61–1.24)
GFR, Estimated: 7 mL/min — ABNORMAL LOW (ref >60)
Glucose, Bld: 117 mg/dL — ABNORMAL HIGH (ref 70–99)
Phosphorus: 6.1 mg/dL — ABNORMAL HIGH (ref 2.5–4.6)
Potassium: 4.4 mmol/L (ref 3.5–5.1)
Sodium: 121 mmol/L — ABNORMAL LOW (ref 135–145)

## 2021-03-08 LAB — CBC
HCT: 29.9 % — ABNORMAL LOW (ref 39.0–52.0)
Hemoglobin: 9.7 g/dL — ABNORMAL LOW (ref 13.0–17.0)
MCH: 29.9 pg (ref 26.0–34.0)
MCHC: 32.4 g/dL (ref 30.0–36.0)
MCV: 92.3 fL (ref 80.0–100.0)
Platelets: 121 10*3/uL — ABNORMAL LOW (ref 150–400)
RBC: 3.24 MIL/uL — ABNORMAL LOW (ref 4.22–5.81)
RDW: 17.7 % — ABNORMAL HIGH (ref 11.5–15.5)
WBC: 15.2 10*3/uL — ABNORMAL HIGH (ref 4.0–10.5)
nRBC: 0.2 % (ref 0.0–0.2)

## 2021-03-08 LAB — MAGNESIUM: Magnesium: 2.3 mg/dL (ref 1.7–2.4)

## 2021-03-08 LAB — COOXEMETRY PANEL
Carboxyhemoglobin: 1.3 % (ref 0.5–1.5)
Methemoglobin: 0.8 % (ref 0.0–1.5)
O2 Saturation: 65.3 %
Total hemoglobin: 9.5 g/dL — ABNORMAL LOW (ref 12.0–16.0)

## 2021-03-08 LAB — GLUCOSE, CAPILLARY
Glucose-Capillary: 99 mg/dL (ref 70–99)
Glucose-Capillary: 123 mg/dL — ABNORMAL HIGH (ref 70–99)
Glucose-Capillary: 110 mg/dL — ABNORMAL HIGH (ref 70–99)
Glucose-Capillary: 134 mg/dL — ABNORMAL HIGH (ref 70–99)

## 2021-03-08 LAB — APTT: aPTT: 66 seconds — ABNORMAL HIGH (ref 24–36)

## 2021-03-08 MED ORDER — POLYETHYLENE GLYCOL 3350 17 G PO PACK: 17.0000 g | PACK | Freq: Every day | ORAL | Status: DC

## 2021-03-08 MED ORDER — CEFAZOLIN IN SODIUM CHLORIDE 3-0.9 GM/100ML-% IV SOLN
3.0000 g | INTRAVENOUS | Status: AC
  Filled 2021-03-08 (×2): qty 100

## 2021-03-08 MED ORDER — AMIODARONE HCL IN DEXTROSE 360-4.14 MG/200ML-% IV SOLN
30.0000 mg/h | INTRAVENOUS | Status: DC
  Administered 2021-03-08 – 2021-03-09 (×4): 30 mg/h via INTRAVENOUS
  Filled 2021-03-08 (×3): qty 200

## 2021-03-08 MED ORDER — AMIODARONE HCL IN DEXTROSE 360-4.14 MG/200ML-% IV SOLN
INTRAVENOUS | Status: AC
  Administered 2021-03-08: 150 mg via INTRAVENOUS
  Filled 2021-03-08: qty 200

## 2021-03-08 MED ORDER — POLYETHYLENE GLYCOL 3350 17 G PO PACK
17.0000 g | PACK | Freq: Every day | ORAL | Status: DC
  Administered 2021-03-08 – 2021-03-22 (×8): 17 g via ORAL
  Filled 2021-03-08 (×10): qty 1

## 2021-03-08 MED ORDER — DOBUTAMINE IN D5W 4-5 MG/ML-% IV SOLN
1.0000 ug/kg/min | INTRAVENOUS | Status: DC
  Administered 2021-03-08: 5 ug/kg/min via INTRAVENOUS
  Administered 2021-03-09 – 2021-03-14 (×5): 3 ug/kg/min via INTRAVENOUS
  Administered 2021-03-15 (×2): 5 ug/kg/min via INTRAVENOUS
  Administered 2021-03-16 – 2021-03-20 (×4): 3 ug/kg/min via INTRAVENOUS
  Administered 2021-03-21: 1 ug/kg/min via INTRAVENOUS
  Filled 2021-03-08 (×14): qty 250

## 2021-03-08 MED ORDER — AMIODARONE HCL IN DEXTROSE 360-4.14 MG/200ML-% IV SOLN
60.0000 mg/h | INTRAVENOUS | Status: AC
  Administered 2021-03-08 (×2): 60 mg/h via INTRAVENOUS
  Filled 2021-03-08: qty 200

## 2021-03-08 MED ORDER — CHLORHEXIDINE GLUCONATE CLOTH 2 % EX PADS
6.0000 | MEDICATED_PAD | Freq: Every day | CUTANEOUS | Status: DC
  Administered 2021-03-09 – 2021-03-24 (×10): 6 via TOPICAL

## 2021-03-08 MED ORDER — AMIODARONE LOAD VIA INFUSION
150.0000 mg | Freq: Once | INTRAVENOUS | Status: AC
  Filled 2021-03-08: qty 83.34

## 2021-03-08 MED ORDER — BENZONATATE 100 MG PO CAPS
200.0000 mg | ORAL_CAPSULE | Freq: Once | ORAL | Status: AC
  Administered 2021-03-08: 200 mg via ORAL
  Filled 2021-03-08: qty 2

## 2021-03-08 NOTE — Progress Notes (Signed)
Patient ID: Phillip Young, male   DOB: November 05, 1985, 35 y.o.   MRN: [id number] [facility] Progress Note   Assessment/ Plan:   1. Acute kidney Injury: Secondary to cardiogenic shock/hemodynamically mediated in the setting of atrial flutter with RVR and underlying severe biventricular congestive heart failure.  As of 8/9 he is down 73 lbs since admission.  High PO intake has been a struggle.  CRRT stopped on 8/9 and s/p iHD on 8/11.  Dialysis nontunneled catheter not functioning well and was replaced on 8/12 per Dr. Haroldine Laws - appreciate his assistance. UOP up a bit - Next HD tx on 8/15 - Will get a tunneled catheter with IR early this week (IR has been consulted  - I place another consult order to request catheter on 8/15.  Blood cx's pending and NGTD.  Clearance with no improvement after HD on 8/13 - concerned re: catheter. Leukocytosis and thrombocytopenia improved.  - Note that for planning, as documented he would need to transition to hospice if he is not able to tolerate intermittent hemodialysis.  Defer additional CRRT as documented by myself and CHF team - On fluid restriction of 1.2 liters/day - defer lasix today  2.  Hyponatremia: Secondary to CHF/impaired free water handling with poorly restricted free water intake.  Also with poorly funcitoning HD catheter - needs tunneled catheter.  For HD as above  3.  Hyperkalemia: improved with RRT.   4.  Biventricular congestive heart failure: s/p CRRT for volume unloading after refractory to diuretics.  Unfortunately not a candidate for advanced therapies per CHF charting.  Not a candidate for additional CRRT. Transitioned to iHD to manage volume  5.  Atrial flutter with RVR: He underwent cardioversion and is on amiodarone.  Note afib with RVR after HD on 8/13. Per CHF team  6. Anemia normocytic - mild; anticipate need for ESA  7. Hyperphosphatemia - follow with HD and now s/p exchange of catheter. Changed to renal diet. Some  improvement   8. Leukocytosis  - blood cultures resent 8/12 - NGTD.  Note 8/3 blood cultures Negative  - slightly improved  9. Thrombocytopenia - not on heparin with HD; note HIT ab elevated and now on bivalrudin per CHF - improved  Subjective:   He had 325 mL uop over 8/13 charted.  Last HD on 8/13 with 2 kg UF.  Noted that nursing reported rapid afib after HD.  He is charted as refusing to wear bipap or to get in bed. He has been on levo at 4 mcg/min and dobutamine.    Review of systems:  Denies overt shortness of breath  Denies n/v No chest pain  Urinating more than before overall    Objective:   BP 118/77   Pulse 97   Temp 98.4 F (36.9 C)   Resp (!) 28   Ht '5\' 11"'$  (1.803 m)   Wt (!) 192.5 kg   SpO2 97%   BMI 59.19 kg/m   Intake/Output Summary (Last 24 hours) at 03/08/2021 0545 Last data filed at 03/08/2021 0200 Gross per 24 hour  Intake 1471.62 ml  Output 2325 ml  Net -853.38 ml   Weight change: 0 kg  Physical Exam:   General adult male in bed in no acute distress  HEENT normocephalic atraumatic extraocular movements intact sclera anicteric Neck supple trachea midline Lungs clear but reduced to auscultation bilaterally normal work of breathing at rest; on BIPAP Heart S1S2 no rub Abdomen soft nontender obese habitus  Extremities 1+ edema  lower extremities Psych normal mood and affect Neuro - alert and oriented x 3 provides hx and follows commands Access - left IJ nontunneled dialysis catheter   Imaging: CARDIAC CATHETERIZATION  Result Date: 03/06/2021 Findings: On DBA 7.5 and NE 5 RA = 17 RV = 53/19 PA = 57/18 (36) PCW = 23 Fick cardiac output/index = 6.0/2.1 Thermo CO/CI = 8.9/3.0 PVR = 2.2 FA sat = 98% PA sat = 52%, 54% PaPI = 2.3 RA/PCW = 0.74 Assessment: 1. Biventricular failure with elevated pressures R>L 2. Preserved CO with pressor support 3. Successful LIJ trialysis catheter placement Plan/Discussion: Continue iHD. Wean inotropes as tolerated. Glori Bickers, MD 2:33 PM   PERIPHERAL VASCULAR CATHETERIZATION  Result Date: 03/06/2021 Findings: On DBA 7.5 and NE 5 RA = 17 RV = 53/19 PA = 57/18 (36) PCW = 23 Fick cardiac output/index = 6.0/2.1 Thermo CO/CI = 8.9/3.0 PVR = 2.2 FA sat = 98% PA sat = 52%, 54% PaPI = 2.3 RA/PCW = 0.74 Assessment: 1. Biventricular failure with elevated pressures R>L 2. Preserved CO with pressor support 3. Successful LIJ trialysis catheter placement Plan/Discussion: Continue iHD. Wean inotropes as tolerated. Glori Bickers, MD 2:33 PM   DG CHEST PORT 1 VIEW  Result Date: 03/07/2021 CLINICAL DATA:  Central line placement EXAM: PORTABLE CHEST 1 VIEW COMPARISON:  Chest x-ray dated 03/01/2021. FINDINGS: New LEFT IJ central line with tip adequately positioned at the level of the mid/upper SVC. Stable marked cardiomegaly. Central pulmonary vascular congestion and bilateral interstitial prominence, grossly stable. No pneumothorax is seen. IMPRESSION: 1. New LEFT IJ central line with tip adequately positioned at the level of the mid/upper SVC. No pneumothorax seen. 2. Stable marked cardiomegaly with CHF/volume overload. Electronically Signed   By: Franki Cabot M.D.   On: 03/07/2021 11:22    Labs: BMET Recent Labs  Lab 03/03/21 0031 03/03/21 1559 03/04/21 0112 03/04/21 1214 03/05/21 0029 03/06/21 0024 03/06/21 1403 03/06/21 1404 03/07/21 0400 03/08/21 0510  NA 126* 124* 120* 120* 120* 121* 125* 122* 119* 121*  K 4.0 4.4 4.8 4.5 4.7 4.3 4.4 4.8 4.2 4.4  CL 90* 89* 87* 87* 85* 87*  --   --  86* 88*  CO2 20* 19* 18* 18* 19* 19*  --   --  18* 20*  GLUCOSE 99 163* 91 98 95 134*  --   --  110* 117*  BUN 22* 25* 32* 41* 52* 47*  --   --  61* 69*  CREATININE 3.55* 4.03* 5.03* 6.39* 7.36* 7.08*  --   --  8.56* 8.77*  CALCIUM 8.8* 9.2 8.9 9.1 9.2 8.3*  --   --  8.4* 8.5*  PHOS 3.2 2.9 3.7  --  6.4* 5.7*  --   --  6.6* 6.1*   CBC Recent Labs  Lab 03/05/21 0029 03/06/21 0024 03/06/21 1403 03/06/21 1404  03/07/21 0400 03/08/21 0510  WBC 22.5* 22.9*  --   --  17.2* 15.2*  HGB 11.2* 10.6* 10.9* 11.9* 9.7* 9.7*  HCT 34.4* 33.4* 32.0* 35.0* 30.7* 29.9*  MCV 91.2 92.8  --   --  93.6 92.3  PLT 55* 41*  --   --  79* 121*    Medications:     amiodarone  400 mg Oral BID   Chlorhexidine Gluconate Cloth  6 each Topical Daily   insulin aspart  0-5 Units Subcutaneous QHS   insulin aspart  0-9 Units Subcutaneous TID WC   mouth rinse  15 mL Mouth Rinse BID  multivitamin  1 tablet Oral QHS   senna-docusate  2 tablet Oral BID   sodium chloride flush  10-40 mL Intracatheter Q12H   sodium chloride flush  3 mL Intravenous Q12H   sodium chloride flush  3 mL Intravenous Q12H   Claudia Desanctis, MD 03/08/2021, 6:03 AM

## 2021-03-08 NOTE — Progress Notes (Signed)
Patient ID: Phillip Young, male   DOB: 05-03-1986, 35 y.o.   MRN: [id number]     Advanced Heart Failure Rounding Note  PCP-Cardiologist: Dr. [name]  Subjective:    Had iHD yesterday with ~ 2.5 L off. Tolerated well but developed AF when coming off the circuit. Rates initially up to 130s now 100-110 but remains in AFL. On po amio and angiomax   Remains on DBA 6 and NE 4. Co-ox 65% Made 625cc of urine yesterday. Continues to drink a lot of fluids. NA 119 -> 121. Weight unchanged. CVP 17-18  On bival for ? HIT PLTs improved to 121k.  WBC continues to fall after line replaced.   Continues to refuse Bipap at night.   Denies orthopnea or PND.   Objective:   Weight Range: (!) 192.5 kg Body mass index is 59.19 kg/m.   Vital Signs:   Temp:  [96.3 F (35.7 C)-98.4 F (36.9 C)] 97.5 F (36.4 C) (08/14 0734) Pulse Rate:  [71-183] 101 (08/14 1000) Resp:  [18-36] 28 (08/14 1000) BP: (72-156)/(45-133) 109/85 (08/14 1000) SpO2:  [94 %-100 %] 100 % (08/14 1000) Weight:  [192.5 kg-194.5 kg] 192.5 kg (08/14 0500) Last BM Date: 03/07/21  Weight change: Filed Weights   03/07/21 1715 03/07/21 2015 03/08/21 0500  Weight: (!) 194.5 kg (!) 192.5 kg (!) 192.5 kg    Intake/Output:   Intake/Output Summary (Last 24 hours) at 03/08/2021 1027 Last data filed at 03/08/2021 1000 Gross per 24 hour  Intake 1392.5 ml  Output 2725 ml  Net -1332.5 ml       Physical Exam   General:  Sitting up in chair HEENT: normal Neck: supple. LIJ HD cath Carotids 2+ bilat; no bruits. No lymphadenopathy or thryomegaly appreciated. Cor: PMI nondisplaced. Irregular rate & rhythm. No rubs, gallops or murmurs. Lungs: clear Abdomen:  obese soft, nontender, nondistended. No hepatosplenomegaly. No bruits or masses. Good bowel sounds. Extremities: no cyanosis, clubbing, rash, trace edema Neuro: alert & orientedx3, cranial nerves grossly intact. moves all 4 extremities w/o difficulty. Affect pleasant   Telemetry     NSR 70-80s.  Personally reviewed   Labs    CBC Recent Labs    03/07/21 0400 03/08/21 0510  WBC 17.2* 15.2*  HGB 9.7* 9.7*  HCT 30.7* 29.9*  MCV 93.6 92.3  PLT 79* 121*    Basic Metabolic Panel Recent Labs    03/07/21 0400 03/08/21 0510  NA 119* 121*  K 4.2 4.4  CL 86* 88*  CO2 18* 20*  GLUCOSE 110* 117*  BUN 61* 69*  CREATININE 8.56* 8.77*  CALCIUM 8.4* 8.5*  MG 2.4 2.3  PHOS 6.6* 6.1*    Liver Function Tests Recent Labs    03/07/21 0400 03/08/21 0510  ALBUMIN 2.7* 2.7*     No results for input(s): LIPASE, AMYLASE in the last 72 hours. Cardiac Enzymes No results for input(s): CKTOTAL, CKMB, CKMBINDEX, TROPONINI in the last 72 hours.  BNP: BNP (last 3 results) Recent Labs    10/10/20 1336 11/07/20 0932 02/11/21 1500  BNP 126.9* 106.0* 194.3*     ProBNP (last 3 results) No results for input(s): PROBNP in the last 8760 hours.   D-Dimer No results for input(s): DDIMER in the last 72 hours.  Hemoglobin A1C No results for input(s): HGBA1C in the last 72 hours.  Fasting Lipid Panel No results for input(s): CHOL, HDL, LDLCALC, TRIG, CHOLHDL, LDLDIRECT in the last 72 hours. Thyroid Function Tests No results for input(s): TSH, T4TOTAL, T3FREE, THYROIDAB  in the last 72 hours.  Invalid input(s): FREET3   Other results:   Imaging    No results found.   Medications:     Scheduled Medications:  amiodarone  400 mg Oral BID   Chlorhexidine Gluconate Cloth  6 each Topical Daily   insulin aspart  0-5 Units Subcutaneous QHS   insulin aspart  0-9 Units Subcutaneous TID WC   mouth rinse  15 mL Mouth Rinse BID   multivitamin  1 tablet Oral QHS   senna-docusate  2 tablet Oral BID   sodium chloride flush  10-40 mL Intracatheter Q12H   sodium chloride flush  3 mL Intravenous Q12H   sodium chloride flush  3 mL Intravenous Q12H    Infusions:  sodium chloride Stopped (02/27/21 1651)   sodium chloride     sodium chloride     sodium chloride      bivalirudin (ANGIOMAX) infusion 0.5 mg/mL (Non-ACS indications) 0.05 mg/kg/hr (03/08/21 1000)   DOBUTamine 6 mcg/kg/min (03/08/21 1000)   norepinephrine (LEVOPHED) Adult infusion 4 mcg/min (03/08/21 1000)    PRN Medications: sodium chloride, sodium chloride, sodium chloride, sodium chloride, acetaminophen **OR** acetaminophen, alteplase, camphor-menthol, diphenhydrAMINE, guaiFENesin-dextromethorphan, lidocaine (PF), lidocaine-prilocaine, menthol-cetylpyridinium, ondansetron (ZOFRAN) IV, oxyCODONE, pentafluoroprop-tetrafluoroeth, sodium chloride, sodium chloride flush, sodium chloride flush, sodium chloride flush, sorbitol  Assessment/Plan   1. Acute on Chronic Biventricular Heart Failure -> cardiogenic shock:  Known NICM since 2019, previously followed by [facility].  Echo 8/21 EF < 20%, moderate LV dilation, severely decreased RV function, severe biatrial enlargement, mild MR. No history of ETOH/drugs.  No FH of cardiomyopathy (mother with [place]).  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  He is too large for cardiac MRI. RHC/LHC on 03/04/20 with no CAD low CI at 2.1. Evaluated by EP but not a candidate for ICD given BMI.  Echo this admission with EF < 20%, RV poorly Dev eloped cardiogenic shock -> AKI in setting of AFL with RVR. Dobutamine started at 2.5 and increased to 7.5.  NE added to support CVVHD. Now down 66 pounds and transitioned to iHD. Now on NE 4 and DBA 6  CVP 17-18 co-ox 65. Tolerated iHD on 8/13 but developed recurrent AFL - Continue NE 4 (can titrate to keep MAP >= 70 to optimize renal perfusion). Decrease DBA to 5 - Urine output picking up a little. Likely will need iHD again tomorrow - With RV failure, goal CVP likely 8-12 range - Not candidate for advanced therapies. This has been explained to him several times. If fails to tolerated iHD off inotopes will need Hospice.  - With AKI/hypotension, stopped Entresto, dapagliflozin,  spironolactone, digoxin.    2. Aflutter w/ RVR:  Noted to be in Afib during a prior admission in 3/22 in the setting of CAP and a/c CHF, but converted to NSR on amiodarone. Discharged home w/ eliquis. Was in 2:1 AFL this admit in setting of a/c CHF w/ marked volume overload, + recent gastroenteritis w/ diarrhea. Has OSA but not using CPAP. TSH WNL. Had DCCV on 8/5. Developed recurrent AFL on 8/13 - Restart IV amio  - Heparin switched to bivalirudin due to HIT. SRA still pending  3. AKI on CKD IV: Likely ATN/cardiorenal.CVVHD stopped on 8/9 after catheter clotted.  iHD started on 8/11 with 2L of volume removed. Trialysis cath switched 8/12. Tolerated iHD on 8/13 - For iHD tomorrow per Renal. There has been some concern that he is not getting adequate clearance with iHD. - Tunneled  cath pending later this week with IR - Continue NE to keep MAP >= 70 - If cannot tolerate iHD of pressors will be Hospice situation  4. Acute Hypoxic Respiratory Failure: Suspect underlying OHS/OSA exacerbated by CHF this admission. Still with significant O2 requirement - Continue oxygen + CPAP. - Refusing Bipap at night   5. Type 2 DM: Farxiga held with AKI.  - continue SSI  6. OSA: Has not been on Bipap at home.   7. Super Morbid Obesity: Body mass index is 59.19 kg/m.  8. UTI: Suspect by UA, sent urine culture.  - Ceftriaxone IV course completed.   9. Hyponatremia: Na 119 -> 121 - Management with iHD - He has been counseled on free water restriction numerous times  10. Leukocytosis: WBC 23 -> 17k -> 15k after HD line changed - Afebrile - BCx NGTD. - Chest x-ray stable 08/07  11. Heparin-induced thrombocytopenia  - Positive for HIT on 8/11 -- heparin switched to bivalirudin  - Plt count down to 41 -> 79 -> 121k  - SRA pending    CRITICAL CARE Performed by: Glori Bickers  Total critical care time: 40 minutes  Critical care time was exclusive of separately billable procedures and treating  other patients.  Critical care was necessary to treat or prevent imminent or life-threatening deterioration.  Critical care was time spent personally by me (independent of midlevel providers or residents) on the following activities: development of treatment plan with patient and/or surrogate as well as nursing, discussions with consultants, evaluation of patient's response to treatment, examination of patient, obtaining history from patient or surrogate, ordering and performing treatments and interventions, ordering and review of laboratory studies, ordering and review of radiographic studies, pulse oximetry and re-evaluation of patient's condition.   Length of Stay: [place], MD  03/08/2021, 10:27 AM  Advanced Heart Failure Team Pager [phone] (M-F; 7a - 5p)

## 2021-03-08 NOTE — Progress Notes (Signed)
2030 Patient heart rhythm changed to uncontrolled A-Fib post HD treatment. Patient has a history of Afib/A-Flutter and currently is receiving PO Amiodarone and Angiomax. Plan to continue with plan of care tonight and encourage patient to take all prescribed medication and wear oxygen.  0000 Patient continues with Afib but refuses to get back in bed to rest and to allow frequent vital signs. 0200 Patient continues with A-Fib but refuses to wear Bipap while sleeping. A-Fib is uncontrolled at this time with a rate of 127. Plan to notify MD on call. Thank you.

## 2021-03-08 NOTE — Consult Note (Signed)
Chief Complaint: Patient was seen in consultation today for  Chief Complaint  Patient presents with   Shortness of Breath    Referring Physician(s): Dr. Royce Macadamia, Nephrology   Supervising Physician: Corrie Mckusick  Patient Status: [facility] - In-pt  History of Present Illness: Phillip Young is a 35 y.o. male with a medical history of morbid obesity, HTN and CHF with an EF of 20%. He presented to the [facility] ED 02/11/21 with shortness of breath and heart palpitations. His O2 sats in the ED were 88-91% and he was found to be in atrial flutter with RVR. On 7/24 he was transferred to the ICU for cardiogenic shock and started on dobutamine, norepinephrine and lasix infusions. Nephrology was consulted at that time for AKI with creatinine up to 3.3. A temporary HD catheter was placed and the patient was started on CRRT.   On 03/06/21 the catheter clotted and IR was requested to place a tunneled line. The patient's WBC was 22.9 and IR recommended waiting until the patient's WBC was closer to normal. WBC is now 15.2 and Interventional Radiology will plan to place a tunneled line tentatively tomorrow.   Past Medical History:  Diagnosis Date   Biventricular congestive heart failure ([place])    Last Echo 11/2019 at [facility] reveals EF 20%   Class 3 severe obesity due to excess calories with serious comorbidity and body mass index (BMI) of 50.0 to 59.9 in adult [facility]) 02/26/2020   Essential hypertension 02/26/2020   GERD without esophagitis 02/26/2020   Hidradenitis suppurativa 02/26/2020   OSA (obstructive sleep apnea) 02/26/2020   Prediabetes 02/26/2020    Past Surgical History:  Procedure Laterality Date   ABSCESS DRAINAGE     RIGHT/LEFT HEART CATH AND CORONARY ANGIOGRAPHY N/A 03/04/2020   Procedure: RIGHT/LEFT HEART CATH AND CORONARY ANGIOGRAPHY;  Surgeon: Larey Dresser, MD;  Location: [place] [facility];  Service: Cardiovascular;  Laterality: N/A;    Allergies: Coreg [carvedilol] and  Heparin  Medications: Prior to Admission medications   Medication Sig Start Date End Date Taking? Authorizing Provider  amiodarone (PACERONE) 200 MG tablet Take 80 mg by mouth daily. 12/18/20  Yes [provider]  amoxicillin (AMOXIL) 500 MG capsule Take 500 mg by mouth 3 (three) times daily. 02/07/21  Yes [provider]  apixaban (ELIQUIS) 5 MG TABS tablet Take 1 tablet (5 mg total) by mouth 2 (two) times daily. 11/07/20  Yes Clegg, Amy D, NP  dapagliflozin propanediol (FARXIGA) 10 MG TABS tablet Take 1 tablet (10 mg total) by mouth daily. 04/16/20  Yes Bensimhon, Shaune Pascal, MD  digoxin (LANOXIN) 0.125 MG tablet Take 1 tablet (0.125 mg total) by mouth daily. 04/16/20  Yes Bensimhon, Shaune Pascal, MD  Potassium Chloride ER 20 MEQ TBCR Take 40 mEq by mouth 2 (two) times daily. 11/21/20 02/11/21 Yes Bensimhon, Shaune Pascal, MD  sacubitril-valsartan (ENTRESTO) 97-103 MG Take 1 tablet by mouth 2 (two) times daily.   Yes [provider]  spironolactone (ALDACTONE) 25 MG tablet TAKE 1 TABLET(25 MG) BY MOUTH DAILY Patient taking differently: Take 25 mg by mouth daily. TAKE 1 TABLET(25 MG) BY MOUTH DAILY 12/23/20  Yes Bensimhon, Shaune Pascal, MD  Torsemide 40 MG TABS Take 80 mg by mouth daily. 12/24/20  Yes Clegg, Amy D, NP     Family History  Problem Relation Age of Onset   Heart disease Mother    Hypertension Mother    Pulmonary Hypertension Mother    Drug abuse Father  died due to Heroin overdose    Social History   Socioeconomic History   Marital status: Unknown    Spouse name: Not on file   Number of children: Not on file   Years of education: Not on file   Highest education level: Not on file  Occupational History   Not on file  Tobacco Use   Smoking status: Former    Packs/day: 1.00    Types: Cigarettes    Quit date: 2019    Years since quitting: 3.6   Smokeless tobacco: Former   Tobacco comments:    quit in 2019  Substance and Sexual Activity   Alcohol use:  Never   Drug use: Never   Sexual activity: Not on file  Other Topics Concern   Not on file  Social History Narrative   Not on file   Social Determinants of Health   Financial Resource Strain: Low Risk    Difficulty of Paying Living Expenses: Not very hard  Food Insecurity: Food Insecurity Present   Worried About [place] in the Last Year: Never true   Ran Out of Food in the Last Year: Sometimes true  Transportation Needs: No Transportation Needs   Lack of Transportation (Medical): No   Lack of Transportation (Non-Medical): No  Physical Activity: Not on file  Stress: Not on file  Social Connections: Not on file    Review of Systems: A 12 point ROS discussed and pertinent positives are indicated in the HPI above.  All other systems are negative.  Review of Systems  Constitutional:  Positive for fever.  Respiratory:  Positive for cough and shortness of breath.   Cardiovascular:  Positive for leg swelling. Negative for chest pain.  Gastrointestinal:  Negative for abdominal pain, diarrhea, nausea and vomiting.  Neurological:  Negative for dizziness and headaches.   Vital Signs: BP (!) 111/54   Pulse 98   Temp 98.3 F (36.8 C) (Oral)   Resp (!) 32   Ht '5\' 11"'$  (1.803 m)   Wt (!) 424 lb 6.2 oz (192.5 kg)   SpO2 99%   BMI 59.19 kg/m   Physical Exam Constitutional:      General: He is not in acute distress.    Appearance: He is obese. He is ill-appearing.  HENT:     Mouth/Throat:     Mouth: Mucous membranes are dry.     Pharynx: Oropharynx is clear.  Cardiovascular:     Rate and Rhythm: Normal rate and regular rhythm.     Comments: Left IJ trialysis catheter.  Pulmonary:     Breath sounds: Normal breath sounds.  Abdominal:     General: Bowel sounds are normal.     Palpations: Abdomen is soft.  Musculoskeletal:     Right lower leg: Edema present.     Left lower leg: Edema present.  Skin:    General: Skin is warm and dry.  Neurological:     Mental  Status: He is alert and oriented to person, place, and time.    Imaging: DG Chest 1 View  Result Date: 02/15/2021 CLINICAL DATA:  Central line placement.  History of CHF. EXAM: CHEST  1 VIEW COMPARISON:  February 13, 2021 FINDINGS: A new right central line terminates in the central SVC. No pneumothorax. Cardiomegaly and probable edema remain on this limited study. No other interval changes. IMPRESSION: 1. The new right central line is in good position terminating in the central SVC without pneumothorax. 2. Cardiomegaly and pulmonary  edema. Electronically Signed   By: Dorise Bullion III M.D   On: 02/15/2021 15:43   DG Chest 2 View  Result Date: 02/11/2021 CLINICAL DATA:  shortness of breath and feeling like his heart is racing.hx chf EXAM: CHEST - 2 VIEW COMPARISON:  10/10/2020 FINDINGS: The central line has been removed. There are mild bilateral interstitial infiltrates or edema, improved since previous. Persistent cardiomegaly. No effusion.  No pneumothorax. Visualized bones unremarkable. IMPRESSION: 1. Mild bilateral interstitial edema or infiltrates, improved since previous Electronically Signed   By: Lucrezia Europe M.D.   On: 02/11/2021 13:32   CARDIAC CATHETERIZATION  Result Date: 03/06/2021 Findings: On DBA 7.5 and NE 5 RA = 17 RV = 53/19 PA = 57/18 (36) PCW = 23 Fick cardiac output/index = 6.0/2.1 Thermo CO/CI = 8.9/3.0 PVR = 2.2 FA sat = 98% PA sat = 52%, 54% PaPI = 2.3 RA/PCW = 0.74 Assessment: 1. Biventricular failure with elevated pressures R>L 2. Preserved CO with pressor support 3. Successful LIJ trialysis catheter placement Plan/Discussion: Continue iHD. Wean inotropes as tolerated. Glori Bickers, MD 2:33 PM   US RENAL  Result Date: 02/13/2021 CLINICAL DATA:  Elevated creatinine, morbid obesity EXAM: RENAL / URINARY TRACT ULTRASOUND COMPLETE COMPARISON:  None. FINDINGS: Right Kidney: Renal measurements: 9.6 x 6.1 x 5.4 cm = volume: 165 mL. Echogenicity within normal limits. No mass or  hydronephrosis visualized. Left Kidney: Renal measurements: 11.5 x 6.5 x 5.5 cm = volume: 217 mL. Echogenicity within normal limits. No mass or hydronephrosis visualized. Bladder: Appears normal for degree of bladder distention. Other: Limited exam because of body habitus. IMPRESSION: Limited exam but no acute finding by ultrasound.  No hydronephrosis. Electronically Signed   By: Jerilynn Mages.  Shick M.D.   On: 02/13/2021 17:03   PERIPHERAL VASCULAR CATHETERIZATION  Result Date: 03/06/2021 Findings: On DBA 7.5 and NE 5 RA = 17 RV = 53/19 PA = 57/18 (36) PCW = 23 Fick cardiac output/index = 6.0/2.1 Thermo CO/CI = 8.9/3.0 PVR = 2.2 FA sat = 98% PA sat = 52%, 54% PaPI = 2.3 RA/PCW = 0.74 Assessment: 1. Biventricular failure with elevated pressures R>L 2. Preserved CO with pressor support 3. Successful LIJ trialysis catheter placement Plan/Discussion: Continue iHD. Wean inotropes as tolerated. Glori Bickers, MD 2:33 PM   DG CHEST PORT 1 VIEW  Result Date: 03/07/2021 CLINICAL DATA:  Central line placement EXAM: PORTABLE CHEST 1 VIEW COMPARISON:  Chest x-ray dated 03/01/2021. FINDINGS: New LEFT IJ central line with tip adequately positioned at the level of the mid/upper SVC. Stable marked cardiomegaly. Central pulmonary vascular congestion and bilateral interstitial prominence, grossly stable. No pneumothorax is seen. IMPRESSION: 1. New LEFT IJ central line with tip adequately positioned at the level of the mid/upper SVC. No pneumothorax seen. 2. Stable marked cardiomegaly with CHF/volume overload. Electronically Signed   By: Franki Cabot M.D.   On: 03/07/2021 11:22   DG CHEST PORT 1 VIEW  Result Date: 03/01/2021 CLINICAL DATA:  Shortness of breath.  CHF. EXAM: PORTABLE CHEST 1 VIEW COMPARISON:  02/23/2021 FINDINGS: Patient has RIGHT IJ central line, tip overlying the UPPER superior vena cava. Stable marked cardiomegaly. Pulmonary vascular congestion appears stable. No focal consolidations or pleural effusions.  IMPRESSION: Stable cardiomegaly and vascular congestion. Electronically Signed   By: Nolon Nations M.D.   On: 03/01/2021 16:38   DG CHEST PORT 1 VIEW  Result Date: 02/23/2021 CLINICAL DATA:  Shortness of breath, acute on chronic systolic CHF EXAM: PORTABLE CHEST 1 VIEW COMPARISON:  02/20/2021  FINDINGS: Right dialysis catheter remains in place, unchanged. Cardiomegaly with vascular congestion and bilateral perihilar opacities and interstitial prominence, increasing since prior study. No visible effusions. No acute bony abnormality. IMPRESSION: Worsening CHF. Electronically Signed   By: Rolm Baptise M.D.   On: 02/23/2021 08:40   DG CHEST PORT 1 VIEW  Result Date: 02/20/2021 CLINICAL DATA:  Dialysis catheter placement EXAM: PORTABLE CHEST 1 VIEW COMPARISON:  02/15/2021 FINDINGS: Right jugular central venous catheter tip appears to be in the right innominate vein. This catheter appears to been exchanged since the prior study. No pneumothorax Cardiac enlargement with vascular congestion. Mild edema has improved in the interval. IMPRESSION: Central venous catheter tip right innominate vein.  No pneumothorax Improvement in pulmonary edema. Electronically Signed   By: Franchot Gallo M.D.   On: 02/20/2021 10:49   DG Chest Port 1 View  Result Date: 02/13/2021 CLINICAL DATA:  Shortness of breath.  PICC line placement. EXAM: PORTABLE CHEST 1 VIEW COMPARISON:  02/11/2021 FINDINGS: 1035 hours. Low volume lordotic film with underpenetration and leftward rotation. The cardio pericardial silhouette is enlarged. Vascular congestion with probable interstitial pulmonary edema although technique on the study hinders assessment. No substantial pleural effusion left costophrenic angle has not been included on the study. Right PICC line tip projects over the mediastinum, just below the carina. IMPRESSION: Low volume lordotic film with under penetration and leftward rotation. The right PICC line tip projects inferior to the  carina over the T6 vertebral body. Given technical limitations and patient rotation, clinical correlation for tip position will be required. If the PICC line is in the SVC, the position of the PICC line tip would be in the mid SVC region. Electronically Signed   By: Misty Stanley M.D.   On: 02/13/2021 11:01   ECHOCARDIOGRAM COMPLETE  Result Date: 02/12/2021    ECHOCARDIOGRAM REPORT   Patient Name:   Phillip Young Date of Exam: 02/12/2021 Medical Rec #:  [id number]      Height:       71.0 in Accession #:    [id number]     Weight:       475.1 lb Date of Birth:  11/22/1985       BSA:          3.046 m Patient Age:    79 years       BP:           126/70 mmHg Patient Gender: M              HR:           122 bpm. Exam Location:  Inpatient Procedure: 2D Echo, Color Doppler, Cardiac Doppler and Intracardiac            Opacification Agent Indications:    [id number] Acute systolic (congestive) heart failure  History:        Patient has prior history of Echocardiogram examinations, most                 recent 06/17/2020. CHF; Risk Factors:Hypertension and Sleep                 Apnea.  Sonographer:    Raquel Sarna Senior RDCS Referring Phys: [id number] Orma Flaming  Sonographer Comments: Very technically difficult study. Patient is morbidly obese, scanned upright in recliner due to dyspnea. IMPRESSIONS  1. Left ventricular ejection fraction, by estimation, is <20%. The left ventricle has severely decreased function. Left ventricular endocardial border not optimally defined to evaluate regional wall motion. The  left ventricular internal cavity size was severely dilated. Left ventricular diastolic parameters are indeterminate.  2. Right ventricular systolic function was not well visualized. The right ventricular size is normal. Tricuspid regurgitation signal is inadequate for assessing PA pressure.  3. Left atrial size was moderately dilated.  4. The mitral valve was not well visualized. Trivial mitral valve regurgitation.  5. The aortic  valve is tricuspid. Aortic valve regurgitation is not visualized. Comparison(s): A prior study was performed on 06/17/2020. LVEF is severely reduced; difficult comparison (this is a very technically difficult study). FINDINGS  Left Ventricle: Left ventricular ejection fraction, by estimation, is <20%. The left ventricle has severely decreased function. Left ventricular endocardial border not optimally defined to evaluate regional wall motion. Definity contrast agent was given  IV to delineate the left ventricular endocardial borders. The left ventricular internal cavity size was severely dilated. There is no left ventricular hypertrophy. Left ventricular diastolic parameters are indeterminate. Right Ventricle: The right ventricular size is normal. Right vetricular wall thickness was not well visualized. Right ventricular systolic function was not well visualized. Tricuspid regurgitation signal is inadequate for assessing PA pressure. Left Atrium: Left atrial size was moderately dilated. Right Atrium: Right atrial size was normal in size. Pericardium: There is no evidence of pericardial effusion. Mitral Valve: The mitral valve was not well visualized. Trivial mitral valve regurgitation. Tricuspid Valve: The tricuspid valve is not well visualized. Tricuspid valve regurgitation is not demonstrated. No evidence of tricuspid stenosis. Aortic Valve: The aortic valve is tricuspid. Aortic valve regurgitation is not visualized. Pulmonic Valve: The pulmonic valve was not well visualized. Pulmonic valve regurgitation is not visualized. Aorta: The aortic root and ascending aorta are structurally normal, with no evidence of dilitation. IAS/Shunts: The interatrial septum was not well visualized.  LEFT VENTRICLE PLAX 2D LVIDd:         7.20 cm LVIDs:         6.70 cm LV PW:         0.90 cm LV IVS:        0.70 cm LVOT diam:     2.50 cm LV SV:         32 LV SV Index:   11 LVOT Area:     4.91 cm  RIGHT VENTRICLE RV S prime:     6.09  cm/s TAPSE (M-mode): 0.7 cm LEFT ATRIUM            Index       RIGHT ATRIUM           Index LA diam:      4.40 cm  1.44 cm/m  RA Area:     20.70 cm LA Vol (A4C): 131.0 ml 43.01 ml/m RA Volume:   55.60 ml  18.26 ml/m  AORTIC VALVE LVOT Vmax:   54.30 cm/s LVOT Vmean:  37.700 cm/s LVOT VTI:    0.066 m  AORTA Ao Root diam: 3.00 cm Ao Asc diam:  3.00 cm  SHUNTS Systemic VTI:  0.07 m Systemic Diam: 2.50 cm Rudean Haskell MD Electronically signed by Rudean Haskell MD Signature Date/Time: 02/12/2021/3:50:48 PM    Final    [place] EKG SITE RITE  Result Date: 02/13/2021 If Site Rite image not attached, placement could not be confirmed due to current cardiac rhythm.  [place] EKG SITE RITE  Result Date: 02/12/2021 If Site Rite image not attached, placement could not be confirmed due to current cardiac rhythm.   Labs:  CBC: Recent Labs    03/05/21 0029 03/06/21  0024 03/06/21 1403 03/06/21 1404 03/07/21 0400 03/08/21 0510  WBC 22.5* 22.9*  --   --  17.2* 15.2*  HGB 11.2* 10.6* 10.9* 11.9* 9.7* 9.7*  HCT 34.4* 33.4* 32.0* 35.0* 30.7* 29.9*  PLT 55* 41*  --   --  79* 121*    COAGS: Recent Labs    02/24/21 1052 02/25/21 0419 03/05/21 2105 03/06/21 0024 03/07/21 0400 03/08/21 0510  INR 1.4*  --   --   --   --   --   APTT  --    < > 74* 77* 62* 66*   < > = values in this interval not displayed.    BMP: Recent Labs    03/10/20 0550 03/11/20 1015 03/21/20 1129 03/27/20 1055 04/29/20 1132 03/05/21 0029 03/06/21 0024 03/06/21 1403 03/06/21 1404 03/07/21 0400 03/08/21 0510  NA 134* 132* 135 136   < > 120* 121* 125* 122* 119* 121*  K 4.6 4.0 3.9 3.9   < > 4.7 4.3 4.4 4.8 4.2 4.4  CL 92* 93* 96* 98   < > 85* 87*  --   --  86* 88*  CO2 32 '30 24 26   '$ < > 19* 19*  --   --  18* 20*  GLUCOSE 116* 120* 107* 169*   < > 95 134*  --   --  110* 117*  BUN 22* '17 12 11   '$ < > 52* 47*  --   --  61* 69*  CALCIUM 9.8 9.5 9.8 9.3   < > 9.2 8.3*  --   --  8.4* 8.5*  CREATININE 1.11 1.03 0.92  0.96   < > 7.36* 7.08*  --   --  8.56* 8.77*  GFRNONAA >60 >60 >60 >60   < > 9* 10*  --   --  8* 7*  GFRAA >60 >60 >60 >60  --   --   --   --   --   --   --    < > = values in this interval not displayed.    LIVER FUNCTION TESTS: Recent Labs    10/11/20 0500 10/12/20 0739 02/11/21 1208 02/12/21 0413 02/15/21 1600 03/05/21 0029 03/06/21 0024 03/07/21 0400 03/08/21 0510  BILITOT 1.0 0.6 2.1* 1.9*  --   --   --   --   --   AST 58* 60* 44* 35  --   --   --   --   --   ALT 50* 57* 52* 47*  --   --   --   --   --   ALKPHOS 53 46 68 69  --   --   --   --   --   PROT 7.7 7.9 7.7 7.9  --   --   --   --   --   ALBUMIN 3.0* 2.9* 3.4* 3.4*   < > 2.7* 2.6* 2.7* 2.7*   < > = values in this interval not displayed.    TUMOR MARKERS: No results for input(s): AFPTM, CEA, CA199, CHROMGRNA in the last 8760 hours.  Assessment and Plan:  Acute kidney injury secondary to cardiogenic shock: [name], 35 year old male, is tentatively scheduled tomorrow for an image-guided tunneled dialysis catheter.   Risks and benefits discussed with the patient including, but not limited to bleeding, infection, vascular injury, pneumothorax which may require chest tube placement, air embolism or even death  All of the patient's questions were answered, patient is agreeable to proceed.  He will be NPO at midnight.   Consent signed and in chart.   Thank you for this interesting consult.  I greatly enjoyed meeting Arjenis Indovina and look forward to participating in their care.  A copy of this report was sent to the requesting provider on this date.  Electronically Signed: Soyla Dryer, AGACNP-BC [phone] 03/08/2021, 1:10 PM   I spent a total of 20 Minutes    in face to face in clinical consultation, greater than 50% of which was counseling/coordinating care for tunneled dialysis catheter.

## 2021-03-08 NOTE — Progress Notes (Signed)
ANTICOAGULATION CONSULT NOTE - Follow Up Consult  Pharmacy Consult for heparin to bivalirudin Indication: atrial fibrillation  Labs: Recent Labs    03/06/21 0024 03/06/21 1403 03/06/21 1404 03/07/21 0400 03/08/21 0510  HGB 10.6*   < > 11.9* 9.7* 9.7*  HCT 33.4*   < > 35.0* 30.7* 29.9*  PLT 41*  --   --  79* 121*  APTT 77*  --   --  62* 66*  CREATININE 7.08*  --   --  8.56* 8.77*   < > = values in this interval not displayed.     Assessment: 35yo male on heparin drip for AFib now in SR on amiodarone. Patient was on a heparin gtt, but developed symptoms of HIT and was changed to bivalirudin.   HIT Ab positive, SRA in process and started bivalirudin. Will utilize actual body weight as recommended by [employer] and use renal adjusted dose for CRRT. Bivalirudin 0.05 mg/kg/hr aptt 66 sec at goal (50-85) h/h stable  Platelets trending back up. Up to 121 today.   Goal of Therapy:  aPTT 50-85 seconds   Plan:  Stop all heparin - allergy added Bivalirudin 0.'05mg'$ /kg/h Check aPTT daily  Cathrine Muster, PharmD PGY2 Cardiology Pharmacy Resident Phone: [phone] 03/08/2021  7:09 AM Please check [email or web address] for unit-specific pharmacy phone numbers.

## 2021-03-09 ENCOUNTER — Encounter (HOSPITAL_COMMUNITY): Payer: Self-pay | Admitting: Internal Medicine

## 2021-03-09 ENCOUNTER — Inpatient Hospital Stay (HOSPITAL_COMMUNITY): Payer: BC Managed Care – PPO

## 2021-03-09 DIAGNOSIS — I5023 Acute on chronic systolic (congestive) heart failure: Secondary | ICD-10-CM | POA: Diagnosis not present

## 2021-03-09 LAB — CBC
HCT: 28.2 % — ABNORMAL LOW (ref 39.0–52.0)
Hemoglobin: 9.4 g/dL — ABNORMAL LOW (ref 13.0–17.0)
MCH: 30.7 pg (ref 26.0–34.0)
MCHC: 33.3 g/dL (ref 30.0–36.0)
MCV: 92.2 fL (ref 80.0–100.0)
Platelets: 180 10*3/uL (ref 150–400)
RBC: 3.06 MIL/uL — ABNORMAL LOW (ref 4.22–5.81)
RDW: 17.9 % — ABNORMAL HIGH (ref 11.5–15.5)
WBC: 13.5 10*3/uL — ABNORMAL HIGH (ref 4.0–10.5)
nRBC: 0 % (ref 0.0–0.2)

## 2021-03-09 LAB — COOXEMETRY PANEL
Carboxyhemoglobin: 1.2 % (ref 0.5–1.5)
Carboxyhemoglobin: 1.3 % (ref 0.5–1.5)
Methemoglobin: 0.8 % (ref 0.0–1.5)
Methemoglobin: 0.9 % (ref 0.0–1.5)
O2 Saturation: 57.4 %
O2 Saturation: 61.7 %
Total hemoglobin: 9.1 g/dL — ABNORMAL LOW (ref 12.0–16.0)
Total hemoglobin: 9.3 g/dL — ABNORMAL LOW (ref 12.0–16.0)

## 2021-03-09 LAB — RENAL FUNCTION PANEL
Albumin: 2.7 g/dL — ABNORMAL LOW (ref 3.5–5.0)
Anion gap: 14 (ref 5–15)
BUN: 77 mg/dL — ABNORMAL HIGH (ref 6–20)
CO2: 19 mmol/L — ABNORMAL LOW (ref 22–32)
Calcium: 8.7 mg/dL — ABNORMAL LOW (ref 8.9–10.3)
Chloride: 87 mmol/L — ABNORMAL LOW (ref 98–111)
Creatinine, Ser: 8.97 mg/dL — ABNORMAL HIGH (ref 0.61–1.24)
GFR, Estimated: 7 mL/min — ABNORMAL LOW (ref >60)
Glucose, Bld: 97 mg/dL (ref 70–99)
Phosphorus: 7.2 mg/dL — ABNORMAL HIGH (ref 2.5–4.6)
Potassium: 4.8 mmol/L (ref 3.5–5.1)
Sodium: 120 mmol/L — ABNORMAL LOW (ref 135–145)

## 2021-03-09 LAB — POCT I-STAT EG7
Acid-base deficit: 1 mmol/L (ref 0.0–2.0)
Bicarbonate: 26.1 mmol/L (ref 20.0–28.0)
Calcium, Ion: 1.11 mmol/L — ABNORMAL LOW (ref 1.15–1.40)
HCT: 29 % — ABNORMAL LOW (ref 39.0–52.0)
Hemoglobin: 9.9 g/dL — ABNORMAL LOW (ref 13.0–17.0)
O2 Saturation: 34 %
Patient temperature: 98.6
Potassium: 4.5 mmol/L (ref 3.5–5.1)
Sodium: 129 mmol/L — ABNORMAL LOW (ref 135–145)
TCO2: 28 mmol/L (ref 22–32)
pCO2, Ven: 51.8 mmHg (ref 44.0–60.0)
pH, Ven: 7.311 (ref 7.250–7.430)
pO2, Ven: 23 mmHg — CL (ref 32.0–45.0)

## 2021-03-09 LAB — GLUCOSE, CAPILLARY
Glucose-Capillary: 121 mg/dL — ABNORMAL HIGH (ref 70–99)
Glucose-Capillary: 136 mg/dL — ABNORMAL HIGH (ref 70–99)
Glucose-Capillary: 100 mg/dL — ABNORMAL HIGH (ref 70–99)
Glucose-Capillary: 101 mg/dL — ABNORMAL HIGH (ref 70–99)

## 2021-03-09 LAB — MAGNESIUM: Magnesium: 2.4 mg/dL (ref 1.7–2.4)

## 2021-03-09 LAB — APTT: aPTT: 76 seconds — ABNORMAL HIGH (ref 24–36)

## 2021-03-09 LAB — LACTIC ACID, PLASMA: Lactic Acid, Venous: 1.2 mmol/L (ref 0.5–1.9)

## 2021-03-09 IMAGING — DX DG CHEST 1V PORT
1 series · 1 of 1 positions shown · non-contrast
Comparison: [DATE]

CLINICAL DATA: Acute shortness of breath, respiratory failure

EXAM:
PORTABLE CHEST 1 VIEW

[chest]
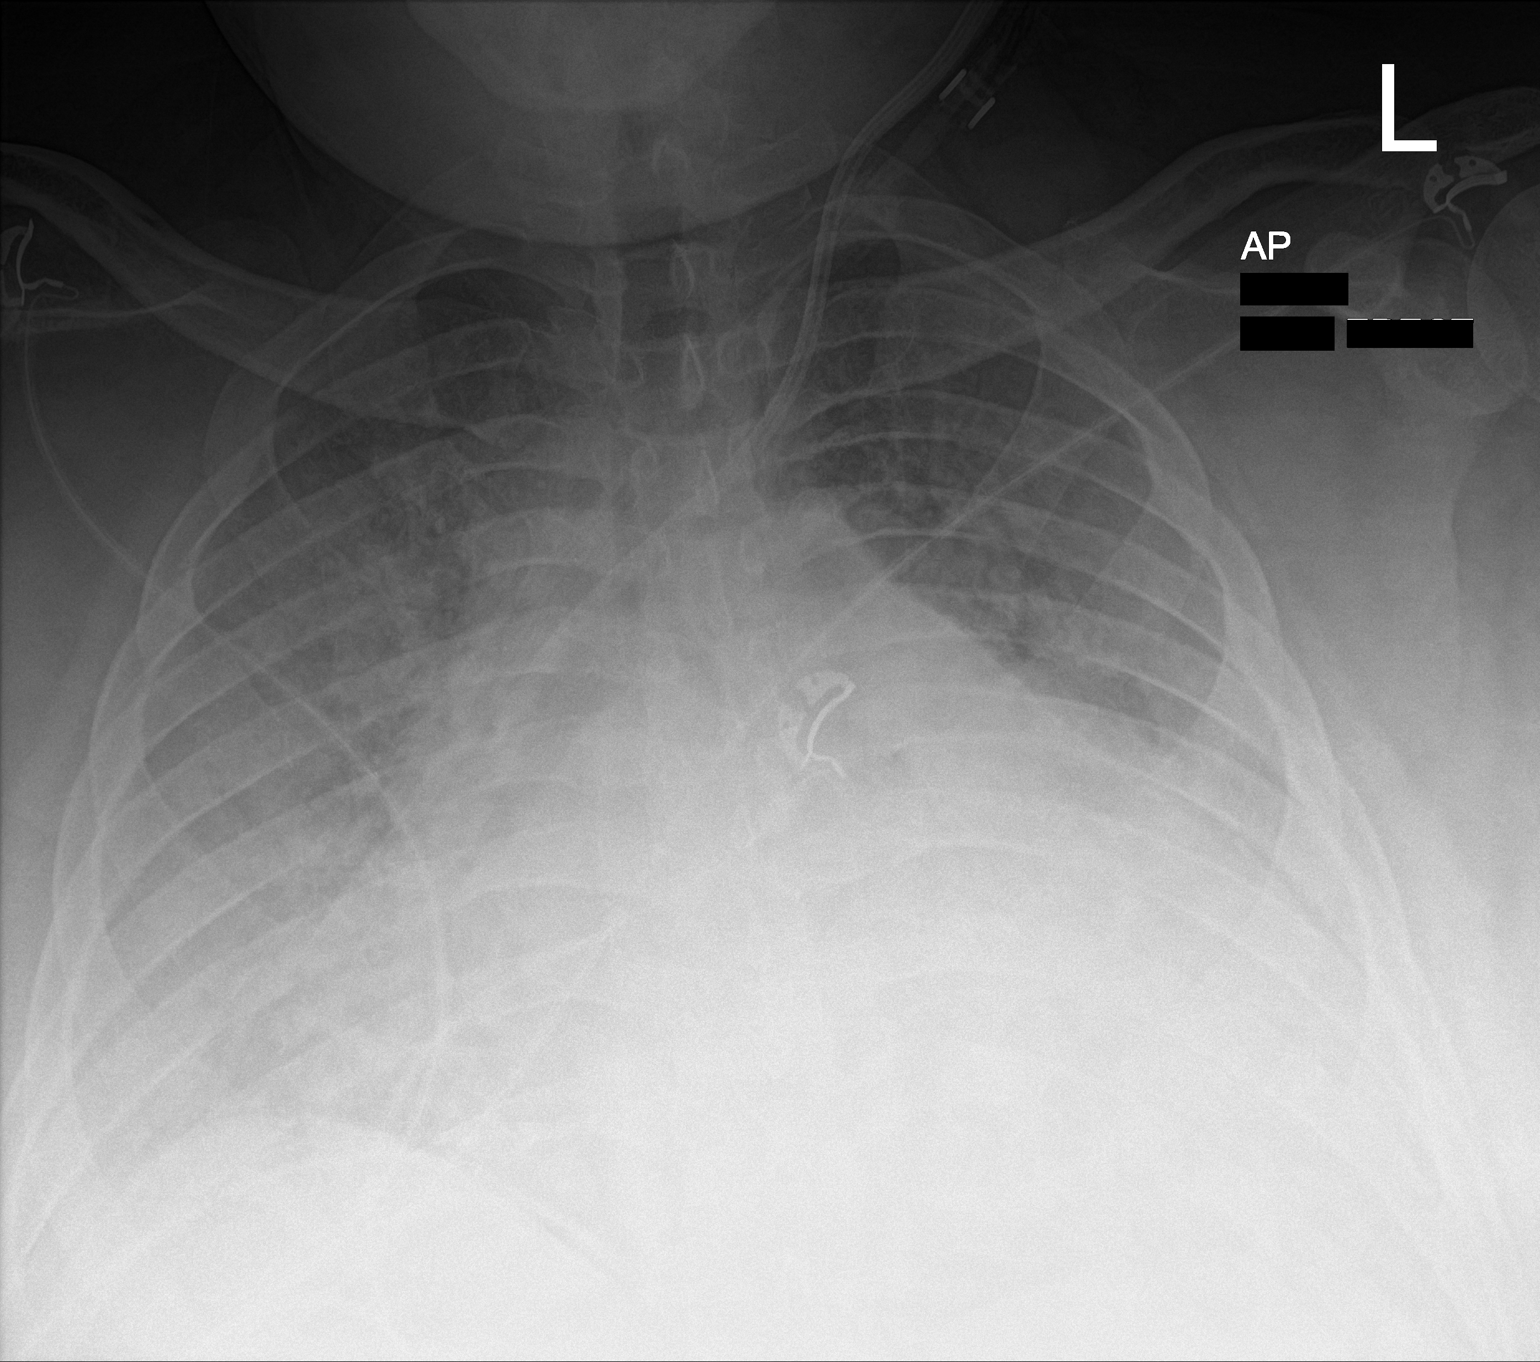

[1 of 1 positions shown; findings below may reference images not displayed]

FINDINGS: Bilateral diffuse interstitial and alveolar airspace opacities. No
pleural effusion or pneumothorax. Stable cardiomegaly. No acute
osseous abnormality.
IMPRESSION: CHF unchanged compared with [DATE].

## 2021-03-09 MED ORDER — TORSEMIDE 20 MG PO TABS
80.0000 mg | ORAL_TABLET | Freq: Two times a day (BID) | ORAL | Status: DC
  Administered 2021-03-09 – 2021-03-14 (×11): 80 mg via ORAL
  Filled 2021-03-09 (×11): qty 4

## 2021-03-09 MED ORDER — MIDODRINE HCL 5 MG PO TABS
15.0000 mg | ORAL_TABLET | Freq: Three times a day (TID) | ORAL | Status: DC
  Administered 2021-03-09 – 2021-03-22 (×40): 15 mg via ORAL
  Filled 2021-03-09 (×38): qty 3

## 2021-03-09 MED ORDER — LORAZEPAM 2 MG/ML IJ SOLN
0.5000 mg | Freq: Four times a day (QID) | INTRAMUSCULAR | Status: DC | PRN
  Administered 2021-03-09 – 2021-03-23 (×8): 0.5 mg via INTRAVENOUS
  Filled 2021-03-09 (×8): qty 1

## 2021-03-09 MED ORDER — HEPARIN SODIUM (PORCINE) 1000 UNIT/ML IJ SOLN
INTRAMUSCULAR | Status: AC
  Administered 2021-03-09: 2800 [IU]
  Filled 2021-03-09: qty 4

## 2021-03-09 NOTE — Progress Notes (Signed)
Pt not CCM pt. Called Bedside and notified

## 2021-03-09 NOTE — Progress Notes (Addendum)
Cards Fellow Humphrey Rolls, MD) made aware of patients status. Dr Humphrey Rolls on his way to speak to the patient.

## 2021-03-09 NOTE — Progress Notes (Addendum)
Patient ID: Phillip Young, male   DOB: [birth year], 35 y.o.   MRN: [id number]     Advanced Heart Failure Rounding Note  PCP-Cardiologist: Dr. [name]  Subjective:    Remains on DBA 5 and NE 5. CO-OX 57%. Weight up 16 pounds but this was a bed weight.   On bival for ? HIT PLTs improved to 121k.    Remains on amio drip.   Feels ok.    Objective:   Weight Range: (!) 199.6 kg Body mass index is 61.37 kg/m.   Vital Signs:   Temp:  [97.5 F (36.4 C)-98.5 F (36.9 C)] 97.5 F (36.4 C) (08/14 2300) Pulse Rate:  [69-154] 76 (08/15 0700) Resp:  [17-41] 26 (08/15 0700) BP: (76-144)/(32-100) 123/54 (08/15 0700) SpO2:  [95 %-100 %] 98 % (08/15 0700) Weight:  [199.6 kg] 199.6 kg (08/15 0600) Last BM Date: 03/07/21  Weight change: Filed Weights   03/07/21 2015 03/08/21 0500 03/09/21 0600  Weight: (!) 192.5 kg (!) 192.5 kg (!) 199.6 kg    Intake/Output:   Intake/Output Summary (Last 24 hours) at 03/09/2021 0704 Last data filed at 03/09/2021 0700 Gross per 24 hour  Intake 2283.07 ml  Output 800 ml  Net 1483.07 ml      Physical Exam  CVP 16.  General:  No resp difficulty HEENT: normal Neck: supple.JVO difficult to assess due to body habitus. Carotids 2+ bilat; no bruits. No lymphadenopathy or thryomegaly appreciated.LIJ  Cor: PMI nondisplaced. Regular rate & rhythm. No rubs, gallops or murmurs. Lungs: clear on 10 liters.  Abdomen: Obese, soft, nontender, nondistended. No hepatosplenomegaly. No bruits or masses. Good bowel sounds. Extremities: no cyanosis, clubbing, rash, edema Neuro: alert & orientedx3, cranial nerves grossly intact. moves all 4 extremities w/o difficulty. Affect pleasant  Telemetry  NSR 70-80s personally reviewed.   Labs    CBC Recent Labs    03/08/21 0510 03/09/21 0325  WBC 15.2* 13.5*  HGB 9.7* 9.4*  HCT 29.9* 28.2*  MCV 92.3 92.2  PLT 121* [id number]   Basic Metabolic Panel Recent Labs    03/08/21 0510 03/09/21 0325  NA 121* 120*  K 4.4 4.8   CL 88* 87*  CO2 20* 19*  GLUCOSE 117* 97  BUN 69* 77*  CREATININE 8.77* 8.97*  CALCIUM 8.5* 8.7*  MG 2.3 2.4  PHOS 6.1* 7.2*   Liver Function Tests Recent Labs    03/08/21 0510 03/09/21 0325  ALBUMIN 2.7* 2.7*    No results for input(s): LIPASE, AMYLASE in the last 72 hours. Cardiac Enzymes No results for input(s): CKTOTAL, CKMB, CKMBINDEX, TROPONINI in the last 72 hours.  BNP: BNP (last 3 results) Recent Labs    10/10/20 1336 11/07/20 0932 02/11/21 1500  BNP 126.9* 106.0* 194.3*    ProBNP (last 3 results) No results for input(s): PROBNP in the last 8760 hours.   D-Dimer No results for input(s): DDIMER in the last 72 hours.  Hemoglobin A1C No results for input(s): HGBA1C in the last 72 hours.  Fasting Lipid Panel No results for input(s): CHOL, HDL, LDLCALC, TRIG, CHOLHDL, LDLDIRECT in the last 72 hours. Thyroid Function Tests No results for input(s): TSH, T4TOTAL, T3FREE, THYROIDAB in the last 72 hours.  Invalid input(s): FREET3   Other results:   Imaging    No results found.   Medications:     Scheduled Medications:  Chlorhexidine Gluconate Cloth  6 each Topical Daily   Chlorhexidine Gluconate Cloth  6 each Topical Q0600   insulin aspart  0-5 Units  Subcutaneous QHS   insulin aspart  0-9 Units Subcutaneous TID WC   mouth rinse  15 mL Mouth Rinse BID   multivitamin  1 tablet Oral QHS   polyethylene glycol  17 g Oral Daily   senna-docusate  2 tablet Oral BID   sodium chloride flush  10-40 mL Intracatheter Q12H   sodium chloride flush  3 mL Intravenous Q12H   sodium chloride flush  3 mL Intravenous Q12H   torsemide  80 mg Oral BID    Infusions:  sodium chloride Stopped (02/27/21 1651)   sodium chloride     sodium chloride     sodium chloride     amiodarone 30 mg/hr (03/09/21 0700)   bivalirudin (ANGIOMAX) infusion 0.5 mg/mL (Non-ACS indications) 0.05 mg/kg/hr (03/09/21 0700)    ceFAZolin (ANCEF) IV     DOBUTamine 5 mcg/kg/min  (03/09/21 0700)   norepinephrine (LEVOPHED) Adult infusion 5 mcg/min (03/09/21 0700)    PRN Medications: sodium chloride, sodium chloride, sodium chloride, sodium chloride, acetaminophen **OR** acetaminophen, alteplase, camphor-menthol, diphenhydrAMINE, guaiFENesin-dextromethorphan, lidocaine (PF), lidocaine-prilocaine, menthol-cetylpyridinium, ondansetron (ZOFRAN) IV, oxyCODONE, pentafluoroprop-tetrafluoroeth, sodium chloride, sodium chloride flush, sodium chloride flush, sodium chloride flush, sorbitol  Assessment/Plan   1. Acute on Chronic Biventricular Heart Failure -> cardiogenic shock:  Known NICM since 2019, previously followed by [facility].  Echo 8/21 EF < 20%, moderate LV dilation, severely decreased RV function, severe biatrial enlargement, mild MR. No history of ETOH/drugs.  No FH of cardiomyopathy (mother with [place]).  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  He is too large for cardiac MRI. RHC/LHC on 03/04/20 with no CAD low CI at 2.1. Evaluated by EP but not a candidate for ICD given BMI.  Echo this admission with EF < 20%, RV poorly Dev eloped cardiogenic shock -> AKI in setting of AFL with RVR. Dobutamine started at 2.5 and increased to 7.5.  NE added to support CVVHD. Now down 66 pounds and transitioned to iHD. Now on NE 5 and DBA 5. -Tolerated iHD on 8/13 but developed recurrent AFL. In SR today.  - CVP 16. Plan for iHD today.  -  With RV failure, goal CVP likely 8-12 range - Not candidate for advanced therapies. This has been explained to him several times. If fails to tolerated iHD off inotopes will need Hospice.  - With AKI/hypotension, stopped Entresto, dapagliflozin, spironolactone, digoxin.   2. Aflutter w/ RVR:  Noted to be in Afib during a prior admission in 3/22 in the setting of CAP and a/c CHF, but converted to NSR on amiodarone. Discharged home w/ eliquis. Was in 2:1 AFL this admit in setting of a/c CHF w/ marked volume  overload, + recent gastroenteritis w/ diarrhea. Has OSA but not using CPAP. TSH WNL. Had DCCV on 8/5. Developed recurrent AFL on 8/13 and was restarted on IV amio.  - In SR today. Will continue IV amio today then switch to po amio.  - Heparin switched to bivalirudin due to HIT. SRA still pending  3. AKI on CKD IV: Likely ATN/cardiorenal.CVVHD stopped on 8/9 after catheter clotted.  iHD started on 8/11 with 2L of volume removed. Trialysis cath switched 8/12. Tolerated iHD on 8/13 - For iHD today. There has been some concern that he is not getting adequate clearance with iHD. - Tunneled cath later today.  - Continue NE to keep MAP >= 70 - If cannot tolerate iHD of pressors will be Hospice situation  4. Acute Hypoxic Respiratory Failure: Suspect underlying OHS/OSA exacerbated  by CHF this admission. Still with significant O2 requirement - Continue oxygen  - Refusing Bipap at night   5. Type 2 DM: Farxiga held with AKI.  - continue SSI  6. OSA: Has not been on Bipap at home.   7. Super Morbid Obesity: Body mass index is 61.37 kg/m.  8. UTI: Suspect by UA, sent urine culture.  - Ceftriaxone IV course completed.   9. Hyponatremia: Na 119 -> 121->120  - Management with iHD - He has been counseled on free water restriction numerous times  10. Leukocytosis: WBC 23 -> 17k -> 15k>13.5  after HD line changed - Afebrile - BCx NGTD. - Chest x-ray stable 08/07  11. Heparin-induced thrombocytopenia  - Positive for HIT on 8/11 -- heparin switched to bivalirudin  - Plt dropped to 41  but went back up after switching to bival-> 79 -> 121-->180   -SRA pending   Weight up 16 pounds? Will need standing weight.  Tomorrow will switch to eliquis after all procedures.   Length of Stay: [place], NP  03/09/2021, 7:04 AM  Advanced Heart Failure Team Pager [phone] (M-F; 7a - 5p)   Patient seen with NP, agree with the above note.   Walked in hall yesterday.  No complaints this morning.  He  went back into NSR overnight.  Stable BP, co-ox 57%.  Remains on dobutamine 5, NE 5, amiodarone 30.    On torsemide 80 mg bid, made 800 cc UOP.  JVP up to 16 today.   General: NAD Neck: JVP 14, no thyromegaly or thyroid nodule.  Lungs: Clear to auscultation bilaterally with normal respiratory effort. CV: Nondisplaced PMI.  Heart regular S1/S2, no S3/S4, no murmur.  1+ ankle edema.  Abdomen: Soft, nontender, no hepatosplenomegaly, no distention.  Skin: Intact without lesions or rashes.  Neurologic: Alert and oriented x 3.  Psych: Normal affect. Extremities: No clubbing or cyanosis.  HEENT: Normal.   Continue IV amiodarone today, back to po tomorrow if stays in NSR.  On bivalirudin with presumed HIT, to get tunneled catheter today and can likely start apixaban tomorrow.   To get HD today, needs fluid off.  He is going to need to come off pressors/inotropes eventually to be able to go home.  - Decrease dobutamine to 3, repeat co-ox afterwards.  - Continue NE 5 for now.  - Start midodrine 15 tid.   Ongoing hyponatremia.  Struggles mightily with fluid restriction despite constant reminder.  Will have HD today to improve but must fluid restrict.  I think this portends poor prognosis.   CRITICAL CARE Performed by: [name]  Total critical care time: 35 minutes  Critical care time was exclusive of separately billable procedures and treating other patients.  Critical care was necessary to treat or prevent imminent or life-threatening deterioration.  Critical care was time spent personally by me on the following activities: development of treatment plan with patient and/or surrogate as well as nursing, discussions with consultants, evaluation of patient's response to treatment, examination of patient, obtaining history from patient or surrogate, ordering and performing treatments and interventions, ordering and review of laboratory studies, ordering and review of radiographic studies,  pulse oximetry and re-evaluation of patient's condition.  [name] 03/09/2021 7:47 AM

## 2021-03-09 NOTE — Progress Notes (Signed)
RT entered to place pt on bipap d/t distress.  Pt refused to wear bipap and removed it as soon as it was placed.  Pt on HF [place]ter (in his mouth). Sats are acceptable but [name] WOB is increased and pt needs bipap placed.  Pt continues to say he wants something to drink, or mouthwash, etc.  Pt placed his [place] back in his mouth at this time and says he will not wear bipap because it will dry him out.  RTx2 and RNx3 spoke frankly about the consequences of not wearing it.  Pt aware of the risks and still refuses.  MD notified by RN at this time.  RT will continue to monitor.

## 2021-03-09 NOTE — Progress Notes (Signed)
RN spoke with Cards Fellow, orders being placed for anxiety medications as well as ABG  to determine patients status. Will call Cards Fellow with results.

## 2021-03-09 NOTE — Progress Notes (Signed)
ANTICOAGULATION CONSULT NOTE - Follow Up Consult  Pharmacy Consult for heparin to bivalirudin Indication: atrial fibrillation  Labs: Recent Labs    03/07/21 0400 03/08/21 0510 03/09/21 0325  HGB 9.7* 9.7* 9.4*  HCT 30.7* 29.9* 28.2*  PLT 79* 121* 180  APTT 62* 66* 76*  CREATININE 8.56* 8.77* 8.97*     Assessment: 35yo male on heparin drip for AFib now in SR on amiodarone. Patient was on a heparin gtt, but developed symptoms of HIT and was changed to bivalirudin.   HIT Ab positive, SRA in process and started bivalirudin. Will utilize actual body weight as recommended by Micromedex and use renal adjusted dose for CRRT. Bivalirudin 0.05 mg/kg/hr aptt 76 sec at goal (50-85) h/h stable  Platelets trending back up. Up to 180 today.   Goal of Therapy:  aPTT 50-85 seconds   Plan:  Bivalirudin 0.'05mg'$ /kg/h Check aPTT daily Follow up plans for tunneled line today by IR, may be able to switch anticoagulation to po if no further procedures are planned.   Erin Hearing PharmD., BCPS Clinical Pharmacist 03/09/2021 7:28 AM

## 2021-03-09 NOTE — TOC Progression Note (Signed)
Transition of Care (TOC) - Progression Note  Heart Failure   Patient Details  Name: Phillip Young MRN: [id number] Date of Birth: April 05, 1986  Transition of [facility]) CM/SW Contact  Janathan Bribiesca, [place] Phone Number: 03/09/2021, 1:54 PM  Clinical Narrative:    CSW received an email from Mr. Durnin over the weekend regarding his FMLA paperwork to fill out and to return by 03/11/21. CSW had Dr. [name] sign the FMLA paperwork and sent back to the patient and his employer.   CSW will continue to follow throughout discharge.   Expected Discharge Plan: Home/Self Care Barriers to Discharge: Continued Medical Work up  Expected Discharge Plan and Services Expected Discharge Plan: Home/Self Care In-house Referral: Clinical Social Work Discharge Planning Services: CM Consult, HF Clinic   Living arrangements for the past 2 months: Single Family Home                                       Social Determinants of Health (SDOH) Interventions Food Insecurity Interventions: Assist with [employer] Application Financial Strain Interventions: Other (Comment) (Referral to [facility]) Housing Interventions: Intervention Not Indicated Transportation Interventions: Intervention Not Indicated  Readmission Risk Interventions Readmission Risk Prevention Plan 10/16/2020 10/16/2020 10/14/2020  Transportation Screening Complete Complete Complete  PCP or Specialist Appt within 5-7 Days Complete Complete Complete  Home Care Screening Complete - -  Medication Review (RN CM) Referral to Pharmacy - -  Some recent data might be hidden   Teofilo Lupinacci, MSW, [place] Heart Failure Social Worker

## 2021-03-09 NOTE — Progress Notes (Signed)
RN called Warren Lacy RN, Lysbeth Galas, and made her aware of patients condition. Elink RN is going to try and get order for PRN medication to control his anxiety in hopes to start BiPAP to decrease patients work of breathing.

## 2021-03-09 NOTE — Progress Notes (Signed)
Pt wants to eat and is starting dialysis now. Will be done around 3p. We will bring down tomorrow for tunneled HD cath

## 2021-03-09 NOTE — Progress Notes (Signed)
Was CTB for acute onset SOB and increased WOB. Mild hypoxia, O2 sats 91% on RA. Was placed on NRB. O2 Sats now 100%.   He had iHD today w/ 2.5L volume removal. CVP checked personally, 17 (CVP of 15 earlier this am).  Maintaining NSR on tele.   He is now felling better w/ NRB  Check STAT CXR.   D/w Dr. [name]. He will need additional iHD tomorrow. I have personally d/w Dr. Augustin Coupe, on call MD for nephrology. He will write orders for iHD again tomorrow.   Pt and RN notified of plan.   Lyda Jester, PA-C

## 2021-03-09 NOTE — Progress Notes (Signed)
RN made aware pt is not longer being seen by CCM. RN called Cardiology for further orders.

## 2021-03-09 NOTE — Progress Notes (Signed)
IR was requested for [facility] placement .   The procedure was tentatively scheduled for today; however, patient had a dialysis scheduled today and wished to eat prior to the dialysis.   The procedure is tentatively scheduled for tomorrow pending IR schedule.  Made NPO at MN.   Please call IR for questions and concerns.    Armando Gang Aaleigha Bozza PA-C 03/09/2021 4:31 PM

## 2021-03-09 NOTE — Progress Notes (Addendum)
Dr Humphrey Rolls made patient aware that he is NPO starting at 2330 d/t the need of BiPAP. Dr. Humphrey Rolls explained the risk and benefits of BiPAP and the patient made the decision to go on BiPAP. The patient asked the RN for more ice and the RN educated the patient for his risk for aspiration if starting BiPAP. Pt stated he understood and did not want to risk his chances. RN tried to get the patient in the bed however, the patient refused to get in the bed until RN gave him more ice. RN explained that there is a high chance of aspiration if he drinks/eats anything on BiPAP. Pt finally agreed to BiPAP and RT placed it on. Pt removed BiPAP mask within minutes because he wanted to swab his mouth out. RN explained that we could do this before but we could not take breaks every 5-41mn to swab his mouth. Pt became frustrated and kept the BiPAP off. RN placed patient back on 10L [place]. RN tried to educate patient on the importance of BiPAP and the patient refused and stated to the RN "I will be fine, watch me". Pt is sitting in the chair, 98% 10L [place], 45RR. Dr. KHumphrey Rollsmade aware and advised to allow patient to stay on oxygen and monitor for acute changes.

## 2021-03-09 NOTE — Plan of Care (Signed)
  Problem: Education: Goal: Knowledge of General Education information will improve Description: Including pain rating scale, medication(s)/side effects and non-pharmacologic comfort measures 03/09/2021 0855 by Kathleene Hazel, RN Outcome: Progressing  Problem: Clinical Measurements: Goal: Diagnostic test results will improve 03/09/2021 0855 by Kathleene Hazel, RN Outcome: Progressing  Problem: Coping: Goal: Level of anxiety will decrease 03/09/2021 0855 by Kathleene Hazel, RN Outcome: Progressing

## 2021-03-09 NOTE — Progress Notes (Signed)
Patient unable to tolerate the CPAP. MD made aware by RN.

## 2021-03-09 NOTE — Progress Notes (Signed)
Upon initial assessment patient was on 15L NRB sating at 99% with a RR ranging from 25-35. RN spoke with Dr Tamala Julian and he advised the patient may need BiPap. After speaking with RT, I attempted to place pt on CPAP and patient was only able to tolerate the CPAP for 19mn before stating "I feel like I am choking". Pt placed back on 15L NRB and RN spoke with RT in regards to trying the patient on BiPap. NAD noted, 100% Spo2, 25RR.

## 2021-03-09 NOTE — Progress Notes (Addendum)
Pt sitting in chair with his eyes closes, swaying in the chair. RN entered room and explained to the patient that if he was going to go to sleep, he needed to be in the bed so that he did not fall out of the chair and get hurt. Pt stated "I am not sleeping, I am meditating". Pt refused to get in the bed. RN placed fall mats on the floor around the patient for safety.

## 2021-03-09 NOTE — Progress Notes (Signed)
PT Cancellation Note  Patient Details Name: Phillip Young MRN: [id number] DOB: 25-Jul-1986   Cancelled Treatment:    Reason Eval/Treat Not Completed: Patient at procedure or test/unavailable.  Pt wrapping up HD on arrival.  Pt unsure if will feel like therapy today, but will check back as able today. 03/09/2021  Ginger Carne., PT Acute Rehabilitation Services [phone]  (pager) [phone]  (office)   [name] 03/09/2021, 2:49 PM

## 2021-03-09 NOTE — Progress Notes (Signed)
Unable to obtian ABG per RT multiple attmepts. Able to run a VBG to use in the meantime. Cards Fellow made aware.

## 2021-03-09 NOTE — Progress Notes (Signed)
Two RT's attempted to obtain an ABG and were unsuccessful.  VBG obtained.

## 2021-03-09 NOTE — Progress Notes (Signed)
Patient ID: Phillip Young, male   DOB: 08-10-1985, 35 y.o.   MRN: [id number] [facility] Progress Note   Assessment/ Plan:   1. Acute kidney Injury: Secondary to cardiogenic shock/hemodynamically mediated in the setting of atrial flutter with RVR and underlying severe biventricular congestive heart failure.  With significant volume unloading/ultrafiltration on CRRT earlier in hospitalization.  Overnight, weight is up by almost 7 kg (16 pounds) but fluid balance shows +1.4 L (unclear accuracy).  He has had significant struggles with limiting fluid intake and this is likely going to be challenging to maintain him euvolemic on IHD.  Some urine output noted overnight, attempt to augment this further with diuretic.  No evidence of renal recovery and plans for hemodialysis later today after placement of tunneled hemodialysis catheter.  Overall prognosis guarded at this time with continued dependency on pressors/dobutamine. 2.  Hyponatremia: Secondary to CHF/impaired free water handling with poorly restricted free water intake-restart diuretic.  Unfortunately, portending a poor prognosis. 3.  Heparin-induced thrombocytopenia: Transitioned to bivalirudin from heparin after positive HIT panel on 8/11. 4.  Biventricular congestive heart failure: Transitioned from CRRT to intermittent hemodialysis and I will restart torsemide today to augment urine output.  Not a candidate for advanced therapies due to morbid obesity. 5.  Atrial flutter with RVR: He underwent cardioversion earlier but now back in atrial flutter with occasional tachycardia, on amiodarone and bivalirudin.  Subjective:   Reports some intermittent shortness of breath overnight without chest pain.   Objective:   BP (!) 124/55   Pulse 73   Temp (!) 97.5 F (36.4 C) (Oral)   Resp 17   Ht '5\' 11"'$  (1.803 m)   Wt (!) 199.6 kg   SpO2 100%   BMI 61.37 kg/m   Intake/Output Summary (Last 24 hours) at 03/09/2021 [id number] Last data filed at  03/09/2021 0600 Gross per 24 hour  Intake 2270.46 ml  Output 950 ml  Net 1320.46 ml   Weight change: 5.1 kg  Physical Exam: Gen: Morbidly obese man, comfortably resting in bed, appears dyspneic with conversation CVS: Pulse regular rhythm and normal rate, S1 and S2 normal Resp: Coarse breath sounds bilaterally with rhonchi Abd: Soft, obese, nontender Ext: 1-2+ anasarca.  Temporary left IJ dialysis catheter in place.  Imaging: No results found.  Labs: BMET Recent Labs  Lab 03/03/21 1559 03/04/21 0112 03/04/21 1214 03/05/21 0029 03/06/21 0024 03/06/21 1403 03/06/21 1404 03/07/21 0400 03/08/21 0510 03/09/21 0325  NA 124* 120* 120* 120* 121* 125* 122* 119* 121* 120*  K 4.4 4.8 4.5 4.7 4.3 4.4 4.8 4.2 4.4 4.8  CL 89* 87* 87* 85* 87*  --   --  86* 88* 87*  CO2 19* 18* 18* 19* 19*  --   --  18* 20* 19*  GLUCOSE 163* 91 98 95 134*  --   --  110* 117* 97  BUN 25* 32* 41* 52* 47*  --   --  61* 69* 77*  CREATININE 4.03* 5.03* 6.39* 7.36* 7.08*  --   --  8.56* 8.77* 8.97*  CALCIUM 9.2 8.9 9.1 9.2 8.3*  --   --  8.4* 8.5* 8.7*  PHOS 2.9 3.7  --  6.4* 5.7*  --   --  6.6* 6.1* 7.2*   CBC Recent Labs  Lab 03/06/21 0024 03/06/21 1403 03/06/21 1404 03/07/21 0400 03/08/21 0510 03/09/21 0325  WBC 22.9*  --   --  17.2* 15.2* 13.5*  HGB 10.6*   < > 11.9* 9.7* 9.7* 9.4*  HCT 33.4*   < > 35.0* 30.7* 29.9* 28.2*  MCV 92.8  --   --  93.6 92.3 92.2  PLT 41*  --   --  79* 121* 180   < > = values in this interval not displayed.    Medications:     Chlorhexidine Gluconate Cloth  6 each Topical Daily   Chlorhexidine Gluconate Cloth  6 each Topical Q0600   insulin aspart  0-5 Units Subcutaneous QHS   insulin aspart  0-9 Units Subcutaneous TID WC   mouth rinse  15 mL Mouth Rinse BID   multivitamin  1 tablet Oral QHS   polyethylene glycol  17 g Oral Daily   senna-docusate  2 tablet Oral BID   sodium chloride flush  10-40 mL Intracatheter Q12H   sodium chloride flush  3 mL  Intravenous Q12H   sodium chloride flush  3 mL Intravenous Q12H   Elmarie Shiley, MD 03/09/2021, 6:35 AM

## 2021-03-10 ENCOUNTER — Inpatient Hospital Stay (HOSPITAL_COMMUNITY): Payer: BC Managed Care – PPO

## 2021-03-10 DIAGNOSIS — R0600 Dyspnea, unspecified: Secondary | ICD-10-CM

## 2021-03-10 DIAGNOSIS — I4892 Unspecified atrial flutter: Secondary | ICD-10-CM | POA: Diagnosis not present

## 2021-03-10 DIAGNOSIS — I5023 Acute on chronic systolic (congestive) heart failure: Secondary | ICD-10-CM | POA: Diagnosis not present

## 2021-03-10 DIAGNOSIS — R0602 Shortness of breath: Secondary | ICD-10-CM

## 2021-03-10 HISTORY — PX: IR FLUORO GUIDE CV LINE RIGHT: IMG2283

## 2021-03-10 HISTORY — PX: IR US GUIDE VASC ACCESS RIGHT: IMG2390

## 2021-03-10 LAB — CBC
HCT: 27.1 % — ABNORMAL LOW (ref 39.0–52.0)
Hemoglobin: 8.5 g/dL — ABNORMAL LOW (ref 13.0–17.0)
MCH: 29.3 pg (ref 26.0–34.0)
MCHC: 31.4 g/dL (ref 30.0–36.0)
MCV: 93.4 fL (ref 80.0–100.0)
Platelets: 281 10*3/uL (ref 150–400)
RBC: 2.9 MIL/uL — ABNORMAL LOW (ref 4.22–5.81)
RDW: 17.8 % — ABNORMAL HIGH (ref 11.5–15.5)
WBC: 13.5 10*3/uL — ABNORMAL HIGH (ref 4.0–10.5)
nRBC: 0.1 % (ref 0.0–0.2)

## 2021-03-10 LAB — RENAL FUNCTION PANEL
Albumin: 2.6 g/dL — ABNORMAL LOW (ref 3.5–5.0)
Anion gap: 10 (ref 5–15)
BUN: 60 mg/dL — ABNORMAL HIGH (ref 6–20)
CO2: 23 mmol/L (ref 22–32)
Calcium: 8.2 mg/dL — ABNORMAL LOW (ref 8.9–10.3)
Chloride: 96 mmol/L — ABNORMAL LOW (ref 98–111)
Creatinine, Ser: 6.41 mg/dL — ABNORMAL HIGH (ref 0.61–1.24)
GFR, Estimated: 11 mL/min — ABNORMAL LOW (ref >60)
Glucose, Bld: 128 mg/dL — ABNORMAL HIGH (ref 70–99)
Phosphorus: 4.3 mg/dL (ref 2.5–4.6)
Potassium: 4.6 mmol/L (ref 3.5–5.1)
Sodium: 129 mmol/L — ABNORMAL LOW (ref 135–145)

## 2021-03-10 LAB — MAGNESIUM: Magnesium: 2 mg/dL (ref 1.7–2.4)

## 2021-03-10 LAB — COOXEMETRY PANEL
Carboxyhemoglobin: 1.5 % (ref 0.5–1.5)
Carboxyhemoglobin: 1.4 % (ref 0.5–1.5)
Methemoglobin: 0.9 % (ref 0.0–1.5)
Methemoglobin: 0.8 % (ref 0.0–1.5)
O2 Saturation: 82.9 %
O2 Saturation: 59.5 %
Total hemoglobin: 9 g/dL — ABNORMAL LOW (ref 12.0–16.0)
Total hemoglobin: 8.7 g/dL — ABNORMAL LOW (ref 12.0–16.0)

## 2021-03-10 LAB — GLUCOSE, CAPILLARY
Glucose-Capillary: 111 mg/dL — ABNORMAL HIGH (ref 70–99)
Glucose-Capillary: 128 mg/dL — ABNORMAL HIGH (ref 70–99)
Glucose-Capillary: 140 mg/dL — ABNORMAL HIGH (ref 70–99)
Glucose-Capillary: 144 mg/dL — ABNORMAL HIGH (ref 70–99)
Glucose-Capillary: 98 mg/dL (ref 70–99)

## 2021-03-10 LAB — APTT: aPTT: 67 seconds — ABNORMAL HIGH (ref 24–36)

## 2021-03-10 IMAGING — US IR FLUORO GUIDE CV LINE*R*
2 series · 2 of 2 positions shown · non-contrast
Comparison: none

INDICATION: Acute kidney injury

[Series 1: ir fluoro guide cv line*right* · 1 of 1 slices shown]
[im 1/1]
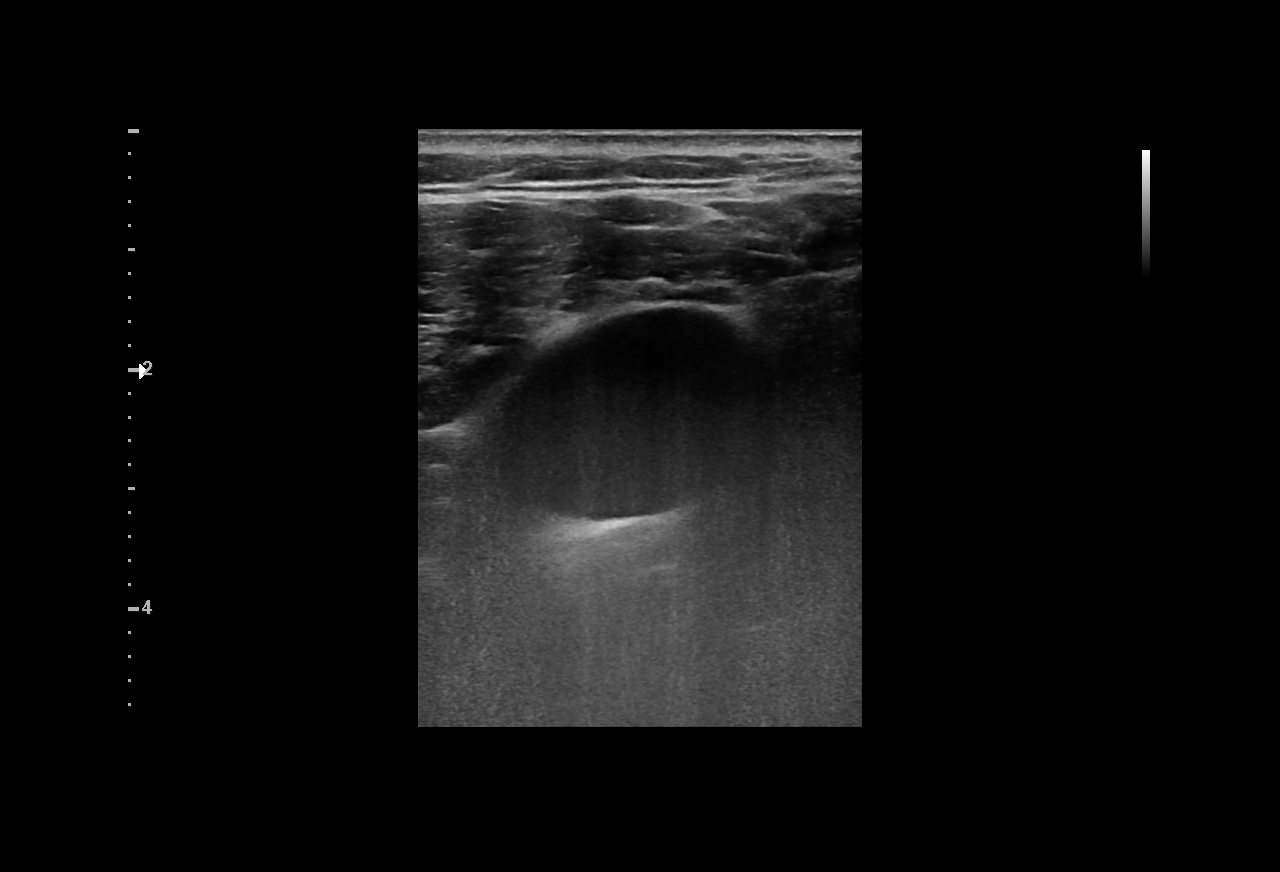

[Series 2: fl (-) angio · 1 of 1 slices shown]
[im 1/1]
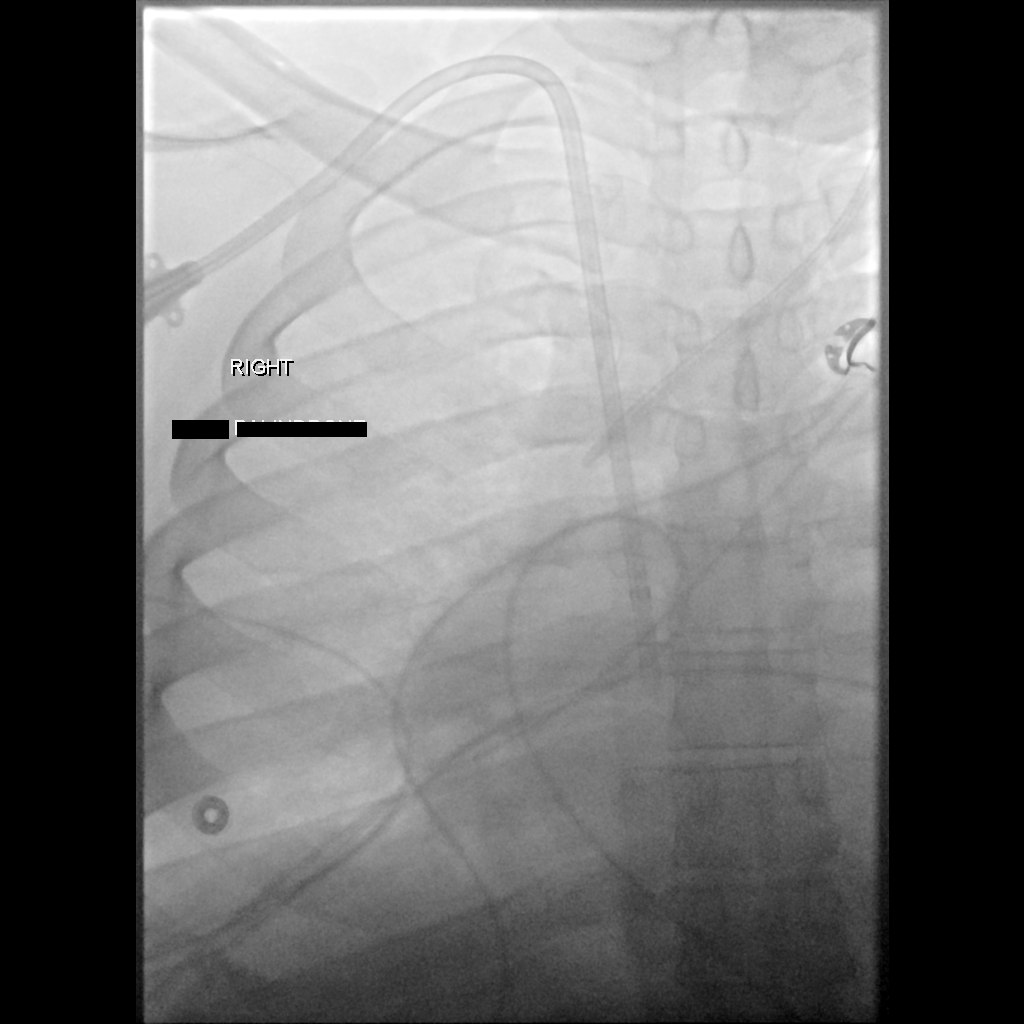

[2 of 2 positions shown; findings below may reference images not displayed]

EXAM:
Ultrasound and fluoroscopy guided tunneled dialysis catheter
placement

MEDICATIONS:
Ancef 3 gm IV; The antibiotic was administered within an appropriate
time interval prior to skin puncture.

ANESTHESIA/SEDATION:
None

FLUOROSCOPY TIME:  Fluoroscopy Time: 1 minutes of 54 seconds (74
mGy).

COMPLICATIONS:
None immediate.

PROCEDURE:
Informed written consent was obtained from the patient after a
thorough discussion of the procedural risks, benefits and
alternatives. All questions were addressed. Maximal Sterile Barrier
Technique was utilized including caps, mask, sterile gowns, sterile
gloves, sterile drape, hand hygiene and skin antiseptic. A timeout
was performed prior to the initiation of the procedure.

The right internal jugular vein was evaluated with ultrasound and
shown to be patent. A permanent ultrasound image was obtained and
placed in the patient's medical record. Using sterile gel and a
sterile probe cover, the right internal jugular vein was entered
with a 21 ga needle during real time ultrasound guidance.

0.018 inch guidewire placed and 21 ga needle exchanged for
transitional dilator set. Utilizing fluoroscopy, 0.035 inch
guidewire advanced through the needle without difficulty.

Seriel dilation was performed and peel-away sheath was placed.
Attention then turned to the right anterior upper chest. Following
local lidocaine administration, the hemodialysis catheter was
tunneled from the chest wall to the venotomy site. The catheter was
inserted through the peel-away sheath. The tip of the catheter was
positioned within the right atrium using fluoroscopic guidance.

All lumens of the catheter aspirated and flushed well. The dialysis
lumens were locked with sodium citrate.

The catheter was secured to the skin with suture. The insertion site
was covered with a Biopatch and sterile dressing.
IMPRESSION: 23 cm right IJ tunneled hemodialysis catheter is ready for use.

## 2021-03-10 MED ORDER — IPRATROPIUM BROMIDE 0.02 % IN SOLN: 0.5000 mg | Freq: Four times a day (QID) | RESPIRATORY_TRACT | Status: DC | PRN

## 2021-03-10 MED ORDER — MIDAZOLAM HCL 2 MG/2ML IJ SOLN
INTRAMUSCULAR | Status: AC
  Filled 2021-03-10: qty 2

## 2021-03-10 MED ORDER — AMIODARONE HCL 200 MG PO TABS
200.0000 mg | ORAL_TABLET | Freq: Two times a day (BID) | ORAL | Status: DC
  Administered 2021-03-10: 200 mg via ORAL
  Filled 2021-03-10: qty 1

## 2021-03-10 MED ORDER — PRISMASOL BGK 4/2.5 32-4-2.5 MEQ/L EC SOLN: Status: DC

## 2021-03-10 MED ORDER — BUDESONIDE 0.25 MG/2ML IN SUSP
0.2500 mg | Freq: Two times a day (BID) | RESPIRATORY_TRACT | Status: DC
  Administered 2021-03-10 – 2021-03-11 (×3): 0.25 mg via RESPIRATORY_TRACT
  Filled 2021-03-10 (×4): qty 2

## 2021-03-10 MED ORDER — PRISMASOL BGK 4/2.5 32-4-2.5 MEQ/L REPLACEMENT SOLN: Status: DC

## 2021-03-10 MED ORDER — AMIODARONE HCL IN DEXTROSE 360-4.14 MG/200ML-% IV SOLN
30.0000 mg/h | INTRAVENOUS | Status: DC
Start: 2021-03-10 — End: 2021-03-19
  Administered 2021-03-11 – 2021-03-19 (×17): 30 mg/h via INTRAVENOUS
  Filled 2021-03-10 (×14): qty 200

## 2021-03-10 MED ORDER — SODIUM CHLORIDE 0.9 % FOR CRRT: INTRAVENOUS_CENTRAL | Status: DC | PRN

## 2021-03-10 MED ORDER — CEFAZOLIN IN SODIUM CHLORIDE 3-0.9 GM/100ML-% IV SOLN
3.0000 g | INTRAVENOUS | Status: AC
  Administered 2021-03-10: 3 g via INTRAVENOUS
  Filled 2021-03-10: qty 100

## 2021-03-10 MED ORDER — NOREPINEPHRINE 16 MG/250ML-% IV SOLN
5.0000 ug/min | INTRAVENOUS | Status: DC
  Administered 2021-03-11: 10 ug/min via INTRAVENOUS
  Administered 2021-03-12: 12 ug/min via INTRAVENOUS
  Administered 2021-03-14: 4 ug/min via INTRAVENOUS
  Administered 2021-03-14: 3 ug/min via INTRAVENOUS
  Administered 2021-03-16 – 2021-03-18 (×2): 10 ug/min via INTRAVENOUS
  Administered 2021-03-19: 8 ug/min via INTRAVENOUS
  Filled 2021-03-10 (×7): qty 250

## 2021-03-10 MED ORDER — PROSOURCE PLUS PO LIQD
30.0000 mL | Freq: Three times a day (TID) | ORAL | Status: DC
  Administered 2021-03-16: 30 mL via ORAL
  Filled 2021-03-10 (×6): qty 30

## 2021-03-10 MED ORDER — SODIUM CHLORIDE 0.9 % IV SOLN
2.0000 g | Freq: Two times a day (BID) | INTRAVENOUS | Status: DC
  Administered 2021-03-11 – 2021-03-12 (×4): 2 g via INTRAVENOUS
  Filled 2021-03-10 (×4): qty 2

## 2021-03-10 MED ORDER — AMIODARONE HCL IN DEXTROSE 360-4.14 MG/200ML-% IV SOLN
INTRAVENOUS | Status: AC
  Administered 2021-03-10: 60 mg/h via INTRAVENOUS
  Filled 2021-03-10: qty 200

## 2021-03-10 MED ORDER — VANCOMYCIN HCL 2000 MG/400ML IV SOLN
2000.0000 mg | Freq: Once | INTRAVENOUS | Status: AC
  Administered 2021-03-10: 2000 mg via INTRAVENOUS
  Filled 2021-03-10: qty 400

## 2021-03-10 MED ORDER — LIDOCAINE HCL 1 % IJ SOLN
INTRAMUSCULAR | Status: AC | PRN
  Administered 2021-03-10: 10 mL

## 2021-03-10 MED ORDER — AMIODARONE HCL IN DEXTROSE 360-4.14 MG/200ML-% IV SOLN
60.0000 mg/h | INTRAVENOUS | Status: DC
  Administered 2021-03-10: 60 mg/h via INTRAVENOUS
  Filled 2021-03-10: qty 200

## 2021-03-10 MED ORDER — FENTANYL CITRATE (PF) 100 MCG/2ML IJ SOLN
INTRAMUSCULAR | Status: AC
  Filled 2021-03-10: qty 2

## 2021-03-10 MED ORDER — HEPARIN SODIUM (PORCINE) 1000 UNIT/ML IJ SOLN
INTRAMUSCULAR | Status: AC
  Administered 2021-03-10: 4000 [IU]
  Filled 2021-03-10: qty 4

## 2021-03-10 MED ORDER — VANCOMYCIN HCL 2000 MG/400ML IV SOLN
2000.0000 mg | INTRAVENOUS | Status: DC
  Administered 2021-03-11 – 2021-03-12 (×2): 2000 mg via INTRAVENOUS
  Filled 2021-03-10 (×2): qty 400

## 2021-03-10 MED ORDER — SODIUM CHLORIDE 0.9 % IV SOLN
2.0000 g | Freq: Once | INTRAVENOUS | Status: AC
  Administered 2021-03-10: 2 g via INTRAVENOUS
  Filled 2021-03-10: qty 2

## 2021-03-10 MED ORDER — ALBUMIN HUMAN 5 % IV SOLN
INTRAVENOUS | Status: AC
  Filled 2021-03-10: qty 250

## 2021-03-10 MED ORDER — ANTICOAGULANT SODIUM CITRATE 4% (200MG/5ML) IV SOLN
Status: AC | PRN
  Administered 2021-03-10: 5 mL

## 2021-03-10 MED ORDER — LIDOCAINE HCL 1 % IJ SOLN
INTRAMUSCULAR | Status: AC
  Filled 2021-03-10: qty 20

## 2021-03-10 NOTE — Progress Notes (Signed)
Patient ID: Phillip Young, male   DOB: February 09, 1986, 35 y.o.   MRN: [id number] [facility] Progress Note   Assessment/ Plan:   1. Acute kidney Injury: Secondary to cardiogenic shock/hemodynamically mediated in the setting of atrial flutter with RVR and underlying severe biventricular congestive heart failure.  With significant volume unloading/ultrafiltration on CRRT earlier in hospitalization.  He remains on pressors and dobutamine and urine output overnight charted as 500 cc.  Unfortunately, oral intake has been challenging for him to limit and after 2 L ultrafiltration with hemodialysis, he is net -0.7 L for the day which portends a poor prognosis with the ability to undertake outpatient dialysis even 4 days a week as he is likely going to have problems with recurrent volume overload.  Discussed hemodialysis again today possibly prior to [facility] exchange to allow him to lay flat in bed.  Continue daily monitoring of labs and urine output for renal recovery. 2.  Hyponatremia: Secondary to CHF/impaired free water handling with poorly restricted free water intake-restarted torsemide yesterday.  Poor prognostic indicator. 3.  Heparin-induced thrombocytopenia: Transitioned to bivalirudin from heparin after positive HIT panel on 8/11. 4.  Biventricular congestive heart failure: Transitioned from CRRT to intermittent hemodialysis and I will restart torsemide today to augment urine output.  Not a candidate for advanced therapies due to morbid obesity. 5.  Atrial flutter with RVR: He underwent cardioversion earlier but now back in atrial flutter with occasional tachycardia, on amiodarone and bivalirudin.  Subjective:   Had some intermittent shortness of breath overnight-refused BiPAP.  Supposed to be n.p.o this morning for [facility] but ate a banana.   Objective:   BP (!) 138/123   Pulse 95   Temp 98.8 F (37.1 C) (Oral)   Resp (!) 40   Ht '5\' 11"'$  (1.803 m)   Wt (!) 199.9 kg Comment: BED WEIGHT  SpO2  90%   BMI 61.47 kg/m   Intake/Output Summary (Last 24 hours) at 03/10/2021 [id number] Last data filed at 03/10/2021 0400 Gross per 24 hour  Intake 1754.79 ml  Output 2500 ml  Net -745.21 ml   Weight change: -1.9 kg  Physical Exam: Gen: Morbidly obese man, sitting up in recliner, oxygen via nasal cannula positioned in his mouth CVS: Pulse regular rhythm and normal rate, S1 and S2 normal Resp: Coarse breath sounds bilaterally with rhonchi Abd: Soft, obese, nontender Ext: 1-2+ anasarca.  Temporary left IJ dialysis catheter in place.  Imaging: DG CHEST PORT 1 VIEW  Result Date: 03/09/2021 CLINICAL DATA:  Acute shortness of breath, respiratory failure EXAM: PORTABLE CHEST 1 VIEW COMPARISON:  03/07/2021 FINDINGS: Bilateral diffuse interstitial and alveolar airspace opacities. No pleural effusion or pneumothorax. Stable cardiomegaly. No acute osseous abnormality. IMPRESSION: CHF unchanged compared with 03/07/2021. Electronically Signed   By: Kathreen Devoid M.D.   On: 03/09/2021 18:13    Labs: BMET Recent Labs  Lab 03/04/21 0112 03/04/21 1214 03/05/21 0029 03/06/21 0024 03/06/21 1403 03/06/21 1404 03/07/21 0400 03/08/21 0510 03/09/21 0325 03/09/21 2211 03/10/21 0509  NA 120* 120* 120* 121* 125* 122* 119* 121* 120* 129* 129*  K 4.8 4.5 4.7 4.3 4.4 4.8 4.2 4.4 4.8 4.5 4.6  CL 87* 87* 85* 87*  --   --  86* 88* 87*  --  96*  CO2 18* 18* 19* 19*  --   --  18* 20* 19*  --  23  GLUCOSE 91 98 95 134*  --   --  110* 117* 97  --  128*  BUN  32* 41* 52* 47*  --   --  61* 69* 77*  --  60*  CREATININE 5.03* 6.39* 7.36* 7.08*  --   --  8.56* 8.77* 8.97*  --  6.41*  CALCIUM 8.9 9.1 9.2 8.3*  --   --  8.4* 8.5* 8.7*  --  8.2*  PHOS 3.7  --  6.4* 5.7*  --   --  6.6* 6.1* 7.2*  --  4.3   CBC Recent Labs  Lab 03/07/21 0400 03/08/21 0510 03/09/21 0325 03/09/21 2211 03/10/21 0509  WBC 17.2* 15.2* 13.5*  --  13.5*  HGB 9.7* 9.7* 9.4* 9.9* 8.5*  HCT 30.7* 29.9* 28.2* 29.0* 27.1*  MCV 93.6 92.3  92.2  --  93.4  PLT 79* 121* 180  --  281    Medications:     Chlorhexidine Gluconate Cloth  6 each Topical Daily   Chlorhexidine Gluconate Cloth  6 each Topical Q0600   heparin sodium (porcine)       insulin aspart  0-5 Units Subcutaneous QHS   insulin aspart  0-9 Units Subcutaneous TID WC   mouth rinse  15 mL Mouth Rinse BID   midodrine  15 mg Oral TID WC   multivitamin  1 tablet Oral QHS   polyethylene glycol  17 g Oral Daily   senna-docusate  2 tablet Oral BID   sodium chloride flush  10-40 mL Intracatheter Q12H   sodium chloride flush  3 mL Intravenous Q12H   sodium chloride flush  3 mL Intravenous Q12H   torsemide  80 mg Oral BID   Elmarie Shiley, MD 03/10/2021, 7:09 AM

## 2021-03-10 NOTE — Procedures (Signed)
Interventional Radiology Procedure Note  Procedure: Tunneled HD catheter  Indication: Renal failure  Findings: Please refer to procedural dictation for full description.  Complications: None  EBL: < 10 mL  Mykela Mewborn, MD [phone]   

## 2021-03-10 NOTE — Progress Notes (Signed)
Pt got himself back into bed without assistance. RN advised for patient to call RN using the call bel before ambulating.

## 2021-03-10 NOTE — Progress Notes (Signed)
Pharmacy Antibiotic Note  Phillip Young is a 35 y.o. male admitted on 02/11/2021 with HF. He now has increased WBC 13, fever 101.6, increased vasopressor needs and increased SOB. BCx drawn and start empiric ABX and CRRT,  Pharmacy has been consulted for Vancomycin and cefepime dosing.  Plan: Vancomycin 2gm IV q24hr Cefepime 2gm IV q12h    Height: '5\' 11"'$  (180.3 cm) Weight: (!) 198.4 kg (437 lb 6.3 oz) IBW/kg (Calculated) : 75.3  Temp (24hrs), Avg:99.7 F (37.6 C), Min:98.3 F (36.8 C), Max:101.6 F (38.7 C)  Recent Labs  Lab 03/06/21 0024 03/07/21 0400 03/08/21 0510 03/09/21 0325 03/09/21 2110 03/10/21 0509  WBC 22.9* 17.2* 15.2* 13.5*  --  13.5*  CREATININE 7.08* 8.56* 8.77* 8.97*  --  6.41*  LATICACIDVEN  --   --   --   --  1.2  --     Estimated Creatinine Clearance: 28.1 mL/min (A) (by C-G formula based on SCr of 6.41 mg/dL (H)).    Allergies  Allergen Reactions   Coreg [Carvedilol] Shortness Of Breath and Diarrhea    Wheezing    Heparin Other (See Comments)    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, SRA pending    Antimicrobials this admission:   Dose adjustments this admission:   Microbiology results: 8/16 BCx:    Bonnita Nasuti Pharm.D. CPP, BCPS Clinical Pharmacist [phone] 03/10/2021 10:27 PM

## 2021-03-10 NOTE — Progress Notes (Signed)
Patient ID: Phillip Young, male   DOB: January 16, 1986, 35 y.o.   MRN: [id number]     Advanced Heart Failure Rounding Note  PCP-Cardiologist: Dr. [name]  Subjective:    Remains on DBA 5 and NE 5. CO-OX 59.5%. Weight up 11 pounds, but was a bed weight.  Yesterday evening, he required NRB due to hypoxia and underwent iHD in the evening.  He refused BiPAP, but ultimately agreed after acute respiratory distress.  He also consumed a banana and ice, despite NPO orders and repeated nursing instruction.  Torsemide resumed yesterday with UOP of 500 mL.  Net I/O is -745 x24 hours.  On exam, he is very short of breath and attributes it to "congestion."  After additional counseling on PO intake, he says he will "get back on track."  His main complaint is a headache.    Objective:   Weight Range: (!) 199.9 kg Body mass index is 61.47 kg/m.   Vital Signs:   Temp:  [97.5 F (36.4 C)-98.8 F (37.1 C)] 98.8 F (37.1 C) (08/16 0000) Pulse Rate:  [29-96] 95 (08/16 0645) Resp:  [20-46] 40 (08/16 0645) BP: (64-165)/(28-123) 138/123 (08/16 0600) SpO2:  [87 %-100 %] 90 % (08/16 0645) Weight:  [194.7 kg-199.9 kg] 199.9 kg (08/16 0500) Last BM Date: 03/09/21  Weight change: Filed Weights   03/09/21 1030 03/09/21 1400 03/10/21 0500  Weight: (!) 197.7 kg (!) 194.7 kg (!) 199.9 kg    Intake/Output:   Intake/Output Summary (Last 24 hours) at 03/10/2021 0731 Last data filed at 03/10/2021 0400 Gross per 24 hour  Intake 1754.79 ml  Output 2500 ml  Net -745.21 ml       Physical Exam  CVP 19.  General:  Sitting up in a chair in acute distress  HEENT: normal Neck: supple.JVP difficult to assess due to body habitus. Carotids 2+ bilat; no bruits. No lymphadenopathy or thryomegaly appreciated.LIJ  Cor: PMI nondisplaced. Regular rate & rhythm. No rubs, gallops or murmurs. Lungs: Dimished  Abdomen: Obese, soft, nontender, nondistended. No hepatosplenomegaly. No bruits or masses. Good bowel  sounds. Extremities: no cyanosis, clubbing, rash, edema Neuro: alert & orientedx3, cranial nerves grossly intact. moves all 4 extremities w/o difficulty. Affect pleasant  Telemetry  NSR 70-80s. Personally reviewed.   Labs    CBC Recent Labs    03/09/21 0325 03/09/21 2211 03/10/21 0509  WBC 13.5*  --  13.5*  HGB 9.4* 9.9* 8.5*  HCT 28.2* 29.0* 27.1*  MCV 92.2  --  93.4  PLT 180  --  [id number]    Basic Metabolic Panel Recent Labs    03/09/21 0325 03/09/21 2211 03/10/21 0509  NA 120* 129* 129*  K 4.8 4.5 4.6  CL 87*  --  96*  CO2 19*  --  23  GLUCOSE 97  --  128*  BUN 77*  --  60*  CREATININE 8.97*  --  6.41*  CALCIUM 8.7*  --  8.2*  MG 2.4  --  2.0  PHOS 7.2*  --  4.3    Liver Function Tests Recent Labs    03/09/21 0325 03/10/21 0509  ALBUMIN 2.7* 2.6*     No results for input(s): LIPASE, AMYLASE in the last 72 hours. Cardiac Enzymes No results for input(s): CKTOTAL, CKMB, CKMBINDEX, TROPONINI in the last 72 hours.  BNP: BNP (last 3 results) Recent Labs    10/10/20 1336 11/07/20 0932 02/11/21 1500  BNP 126.9* 106.0* 194.3*     ProBNP (last 3 results) No results for  input(s): PROBNP in the last 8760 hours.   D-Dimer No results for input(s): DDIMER in the last 72 hours.  Hemoglobin A1C No results for input(s): HGBA1C in the last 72 hours.  Fasting Lipid Panel No results for input(s): CHOL, HDL, LDLCALC, TRIG, CHOLHDL, LDLDIRECT in the last 72 hours. Thyroid Function Tests No results for input(s): TSH, T4TOTAL, T3FREE, THYROIDAB in the last 72 hours.  Invalid input(s): FREET3   Other results:   Imaging    DG CHEST PORT 1 VIEW  Result Date: 03/09/2021 CLINICAL DATA:  Acute shortness of breath, respiratory failure EXAM: PORTABLE CHEST 1 VIEW COMPARISON:  03/07/2021 FINDINGS: Bilateral diffuse interstitial and alveolar airspace opacities. No pleural effusion or pneumothorax. Stable cardiomegaly. No acute osseous abnormality. IMPRESSION: CHF  unchanged compared with 03/07/2021. Electronically Signed   By: Kathreen Devoid M.D.   On: 03/09/2021 18:13     Medications:     Scheduled Medications:  Chlorhexidine Gluconate Cloth  6 each Topical Daily   Chlorhexidine Gluconate Cloth  6 each Topical Q0600   heparin sodium (porcine)       insulin aspart  0-5 Units Subcutaneous QHS   insulin aspart  0-9 Units Subcutaneous TID WC   mouth rinse  15 mL Mouth Rinse BID   midodrine  15 mg Oral TID WC   multivitamin  1 tablet Oral QHS   polyethylene glycol  17 g Oral Daily   senna-docusate  2 tablet Oral BID   sodium chloride flush  10-40 mL Intracatheter Q12H   sodium chloride flush  3 mL Intravenous Q12H   sodium chloride flush  3 mL Intravenous Q12H   torsemide  80 mg Oral BID    Infusions:  sodium chloride Stopped (02/27/21 1651)   sodium chloride     sodium chloride     sodium chloride     amiodarone 30 mg/hr (03/10/21 0400)   bivalirudin (ANGIOMAX) infusion 0.5 mg/mL (Non-ACS indications) 0.05 mg/kg/hr (03/10/21 0400)   DOBUTamine 3 mcg/kg/min (03/10/21 0400)   norepinephrine (LEVOPHED) Adult infusion 5 mcg/min (03/10/21 0400)    PRN Medications: sodium chloride, sodium chloride, sodium chloride, sodium chloride, acetaminophen **OR** acetaminophen, alteplase, camphor-menthol, diphenhydrAMINE, guaiFENesin-dextromethorphan, lidocaine (PF), lidocaine-prilocaine, LORazepam, menthol-cetylpyridinium, ondansetron (ZOFRAN) IV, oxyCODONE, pentafluoroprop-tetrafluoroeth, sodium chloride, sodium chloride flush, sodium chloride flush, sodium chloride flush, sorbitol  Assessment/Plan   1. Acute on Chronic Biventricular Heart Failure -> cardiogenic shock:  Known NICM since 2019, previously followed by [facility].  Echo 8/21 EF < 20%, moderate LV dilation, severely decreased RV function, severe biatrial enlargement, mild MR. No history of ETOH/drugs.  No FH of cardiomyopathy (mother with [place]).  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  He is too large for cardiac MRI. RHC/LHC on 03/04/20 with no CAD low CI at 2.1. Evaluated by EP but not a candidate for ICD given BMI.  Echo this admission with EF < 20%, RV poorly Dev eloped cardiogenic shock -> AKI in setting of AFL with RVR. Dobutamine started at 2.5 and increased to 7.5.  NE added to support CVVHD. Now down 66 pounds and transitioned to iHD. Weaning dobutamine.  Continue DBA at 5 and decrease NE to 3 after dialysis. -Tolerated iHD on 8/13 but developed recurrent AFL. In SR today.  - CVP 19. Plan for iHD today. Last session yesterday evening after he developed acute respiratory distress  -  With RV failure, goal CVP likely 8-12 range - Not candidate for advanced therapies. This has been explained to him  several times. If fails to tolerated iHD off inotopes, he will need hospice.  - With AKI/hypotension, stopped Entresto, dapagliflozin, spironolactone, digoxin.   2. Aflutter w/ RVR:  Noted to be in Afib during a prior admission in 3/22 in the setting of CAP and a/c CHF, but converted to NSR on amiodarone. Discharged home w/ eliquis. Was in 2:1 AFL this admit in setting of a/c CHF w/ marked volume overload, + recent gastroenteritis w/ diarrhea. Has OSA but not using CPAP. TSH WNL. Had DCCV on 8/5. Developed recurrent AFL on 8/13 and was restarted on IV amio.  - In SR today. Will continue IV amio for now  - Heparin switched to bivalirudin due to HIT. SRA still pending. After [facility] placement, will switch to Eliquis   3. AKI on CKD IV: Likely ATN/cardiorenal.CVVHD stopped on 8/9 after catheter clotted.  iHD started on 8/11 with 2L of volume removed. Trialysis cath switched 8/12. Tolerated iHD on 8/13 - For iHD today. Volume status not optimized due to excessive PO intake.  Patient again counseled on importance of fluid restriction  - Tunneled cath later today - Continue NE to keep MAP >= 70 - If cannot tolerate iHD of pressors will be hospice  situation  4. Acute Hypoxic Respiratory Failure: Suspect underlying OHS/OSA exacerbated by CHF this admission. Still with significant O2 requirement - Continue oxygen  - Refusing Bipap at night   5. Type 2 DM: Farxiga held with AKI.  - continue SSI  6. OSA: Has not been on Bipap at home.   7. Super Morbid Obesity: Body mass index is 61.47 kg/m.  8. UTI: Suspect by UA, sent urine culture.  - Ceftriaxone IV course completed.   9. Hyponatremia: Na 119 -> 121->120. Now improved to 129 - Management with iHD - He has been counseled on free water restriction numerous times  10. Leukocytosis: WBC 23 -> 17k -> 15k>13.5  after HD line changed - Afebrile - BCx NGTD. - Chest x-ray stable 08/07  11. Heparin-induced thrombocytopenia  - Positive for HIT on 8/11 -- heparin switched to bivalirudin  - Plt dropped to 41  but went back up after switching to bival-> 79 -> 121-->180.  Now 281  -SRA pending  - After [facility] placement, will switch to Eliquis   Length of Stay: [address] NP  03/10/2021, 7:31 AM  Advanced Heart Failure Team Pager [phone] (M-F; 7a - 5p)   Patient seen with NP, agree with the above note.   He had HD yesterday, 2L UF but still only 0.7 L net negative for the day. Made 500 cc UOP, still on torsemide.   JVP today 20.  He is orthopneic, mildly tachypneic.    Remains on dobutamine 3, NE 5.  He is in NSR on amiodarone gtt.   General: NAD Neck: JVP 16 cm, no thyromegaly or thyroid nodule.  Lungs: Clear to auscultation bilaterally with normal respiratory effort. CV: Nondisplaced PMI.  Heart regular S1/S2, no S3/S4, no murmur.  Trace ankle edema.  Abdomen: Soft, nontender, no hepatosplenomegaly, no distention.  Skin: Intact without lesions or rashes.  Neurologic: Alert and oriented x 3.  Psych: Normal affect. Extremities: No clubbing or cyanosis.  HEENT: Normal.   Can transition to po amiodarone today.  On bivalirudin with presumed HIT, to get tunneled  catheter today and can likely start apixaban tomorrow after catheter placed.   To get HD again today, needs fluid off.  He is going to need to come  off pressors/inotropes eventually to be able to go home. Still not compliant with dietary restrictions, this is going to make transition to home HD very difficult.  - Continue dobutamine 3, decrease NE to 3.  - Continue midodrine 15 tid.  - Long discussion again about the need to restrict fluid and sodium.  I am concerned that we are not going to be able to get him to the point where we can get him on home HD.  Only option is going to be hospice/comfort care.    CRITICAL CARE Performed by: [name]  Total critical care time: 35 minutes  Critical care time was exclusive of separately billable procedures and treating other patients.  Critical care was necessary to treat or prevent imminent or life-threatening deterioration.  Critical care was time spent personally by me on the following activities: development of treatment plan with patient and/or surrogate as well as nursing, discussions with consultants, evaluation of patient's response to treatment, examination of patient, obtaining history from patient or surrogate, ordering and performing treatments and interventions, ordering and review of laboratory studies, ordering and review of radiographic studies, pulse oximetry and re-evaluation of patient's condition.  Farley Ly [facility] 03/10/2021 7:31 AM

## 2021-03-10 NOTE — Progress Notes (Signed)
Patient given instructions to limit oral intake multiple times today post IR.  Noncompliant with fluid restriction.  Noncompliant with previous NPO status.  Noncompliant with bipap.  Levo was increased during IR procedue due to hypotension.  MDs aware of levo titration, afib, and noncompliance with npo/ fluid restriction and bipap use.  One PIV found on floor.  One PIV remains.

## 2021-03-10 NOTE — Progress Notes (Signed)
NAME:  Phillip Young, MRN:  [id number], DOB:  March 22, 1986, LOS: 56 ADMISSION DATE:  02/11/2021, CONSULTATION DATE:  02/15/21 REFERRING MD:  Harvin Hazel HF  -- [name] , CHIEF COMPLAINT:  decompensated heart failure, renal failure    History of Present Illness:  35 yo M PMH class V obesity, biventricular heart failure, HTN, OSA presented to ED 7/20 with CC SOB, palpitations x 1 week. Associated dry cough, intermittent hypoxia, edema. Admitted to [facility] for management of acute on chronic heart failure, rapid aflutter. Got '80mg'$  IV lasix, amio. 7/21 Adv HF consulted for further assistance. 7/24 patient in the CVICU on a lasix infusion as well as dobutamine without robust response to diuresis (notably patient is refusing foley catheter placement). He was started on norepi 7/24 with persistent soft Bps amd worsening AKI, volume overload.   He improved somewhat with pressors and inotropes and PCCM signed off, however has had a prolonged hospital stay and on 8/16 had worsening volume overload and respiratory distress and was refusing to wear his CPAP so PCCM re-consulted in this setting   Pertinent  Medical History  Biventricular heart failure OSA Class V obesity GERD HTN DM2  Significant Hospital Events: Including procedures, antibiotic start and stop dates in addition to other pertinent events   7/20 admitted to [facility] for Afl RVR, acute on chronic BiV HF, hypoxia. Amio, push dose lasix  7/21 adv HF consult, started dobutamine, lasix gtt  7/22 ECHO with LVEF <20% (worse than prior). Hard to assess RV with technically difficult study  7/24 adding NE. CCM consult. Worse renal function, on lasix gtt but teetering need for CRRT  7/25 CRRT pulling 150/hr 8/16 Tunneled Hd catheter placed by IR.  Developed fever and worsening dyspnea requiring 15L O2, refusing Bipap, PCCM reconsulted  Interim History / Subjective:   Pt awake, mentating on non-rebreather mask  Objective   Blood pressure 101/73, pulse (!) 114,  temperature (!) 100.6 F (38.1 C), temperature source Oral, resp. rate (!) 42, height '5\' 11"'$  (1.803 m), weight (!) 198.4 kg, SpO2 100 %. CVP:  [0 mmHg-21 mmHg] 9 mmHg      Intake/Output Summary (Last 24 hours) at 03/10/2021 1706 Last data filed at 03/10/2021 1200 Gross per 24 hour  Intake 1044.2 ml  Output 3400 ml  Net -2355.8 ml    Filed Weights   03/10/21 0500 03/10/21 0730 03/10/21 1042  Weight: (!) 199.9 kg (!) 202.1 kg (!) 198.4 kg   General:  morbidly obese M, awake in moderate respiratory distress, sitting up in chair HEENT: MM pink/moist Neuro: awake, and alert, oriented and following commands CV: s1s2 tachycardic, irregular, no m/r/g PULM:  Bilateral rhonchi throughout, on 15L non-rebreather with tachypnea and mild accessory muscle use though able to answer questions  GI: soft, bsx4 active  Extremities: warm/dry, 3+edema  Skin: no rashes or lesions    Resolved Hospital Problem list     Assessment & Plan:    Acute hypoxemic respiratory failure on Home O2, Pulmonary edema.  OSA/OHS  With baseline poor CPAP compliance Continued pulmonary edema and above dry weight P: -pt encouraged to wear CPAP, states if he were to need a ventilator he would want to be intubated -Pulmicort and Ipratropium nebs -nephrology resuming CRRT    Acute on chronic biventricular decompensated heart failure with pulmonary edema and cardiogenic shock EF <20% P:  -management per primary team, Dobutamine and Levophed weaned off earlier today -not a candidate for advanced therapies, may need hospice if unable to tolerate iHD  Atrial flutter w RVR P: -re-bolused with amiodarone today -heparin switched to bivalirudin 2/2 HIT -plan to change to eliquis following tunneled dialysis catheter placement    Cardiorenal syndrome:  Pt unable to tolerate iHD Tunneled dialysis catheter placed today P: -resume CVVHD per nephrology   Fever Fever curve trending up today with TMAX 99.7,  completed course of Ceftriaxone earlier in hospital course for UTI P: -cefazolin resumed, cultures without growth thus far, consider removing CVL, however patient refusing to get out of the chair and will not tolerate laying flat tonight  Best Practice (right click and "Reselect all SmartList Selections" daily)   Per primary  CRITICAL CARE Performed by: Otilio Carpen Deadrian Toya   Total critical care time: 35 minutes  Critical care time was exclusive of separately billable procedures and treating other patients.  Critical care was necessary to treat or prevent imminent or life-threatening deterioration.  Critical care was time spent personally by me on the following activities: development of treatment plan with patient and/or surrogate as well as nursing, discussions with consultants, evaluation of patient's response to treatment, examination of patient, obtaining history from patient or surrogate, ordering and performing treatments and interventions, ordering and review of laboratory studies, ordering and review of radiographic studies, pulse oximetry and re-evaluation of patient's condition.   Otilio Carpen Harley Mccartney, PA-C  Pulmonary & Critical care See Amion for pager If no response to pager , please call [phone] until 7pm After 7:00 pm call Elink  [phone]?[place]

## 2021-03-10 NOTE — Progress Notes (Addendum)
  Dyspneic at rest. Wants to stay in the chair.  Temp 102 - Obtain bld cx now. Start vanc and cefepime    Currently on 15 liters HFNC. On dobutamine 3 mcg + norepi 8 mcg.   Back in A fib RVR with rate 120s.   Reload amio drip.   Laquisha Northcraft 4:08 PM

## 2021-03-10 NOTE — Progress Notes (Signed)
Patient stated he would not be wearing the CPAP tonight and would continue to wear the NRB.  RT advised RN if there are any changes to contact respiratory.

## 2021-03-10 NOTE — Progress Notes (Signed)
PT Cancellation Note  Patient Details Name: Phillip Young MRN: [id number] DOB: [birth year]   Cancelled Treatment:    Reason Eval/Treat Not Completed: Patient at procedure or test/unavailable  Currently undergoing HD; Worth considering bringing HD recliner for pt to try HD in recliner;   Will follow up later today as time allows;  Otherwise, will follow up for PT tomorrow;   Thank you,  Roney Marion, PT  Acute Rehabilitation Services Pager [phone] Office [place] 03/10/2021, 10:30 AM

## 2021-03-10 NOTE — Progress Notes (Signed)
Pt found eating a banana even though made aware he is NPO.

## 2021-03-10 NOTE — Progress Notes (Signed)
While checking on patient after his return from IR, he was in considerable respiratory distress on 15 liters of oxygen.  Respiratory arrived to bedside and encouraged use of BiPAP, which he refused.  "I'll be fine."    When urged to reconsider due to the high risk of decompensation, he replied, "that's not going to happen."  Respiratory applied a non-rebreather mask after he again refused BiPAP and to return to bed.    Both provider and respiratory therapist reiterated that his body can only adapt to a certain level of hypoxia.  We educated him that if he were to be intubated, there is a high likelihood that he would not be extubated or require tracheostomy if even an option.  He also was reminded that there is the possibility he may die.  He replied, "Okay, thank you."

## 2021-03-10 NOTE — Progress Notes (Signed)
Pt agreed to give BiPAP another chance. Pt is tolerating treatment well at this time

## 2021-03-10 NOTE — Progress Notes (Signed)
Nutrition Follow-up  DOCUMENTATION CODES:   Morbid obesity  INTERVENTION:   Resume 30 ml ProSource Plus TID, each supplement provides 100 kcals and 15 grams protein.     Double portion of protein at meals   Continue Renal MVI   NUTRITION DIAGNOSIS:   Increased nutrient needs related to acute illness as evidenced by estimated needs.  Being addressed   GOAL:   Patient will meet greater than or equal to 90% of their needs  Progressing  MONITOR:   PO intake, Supplement acceptance, Weight trends, Labs  REASON FOR ASSESSMENT:   Rounds (CRRT)    ASSESSMENT:   35 yo male admitted with acute respiratory failure with pulmonary edema with acute decompensated heart failure, cardiogenic shock. Pt requiring CRRT for volume removal. PMH includes biventricular heart failure, morbid obesity, HTN, GERD, OSA, pre-DM  Pt remains on levophed and dobutamine; also on amiodarone and bival drips  Receiving and tolerating iHD at present. Plan for [facility] today but pt ate banana even though NPO  Recorded po intake 20-100%  Weight has trended back up post d/c of CRRT  UOP 500 mL after torsemide resumed yesterday  Labs: sodium 129 (L); CBGs 98-144 Meds: ss novolog, miralax, senokot, Rena-Vite  Diet Order:   Diet Order             Diet NPO time specified Except for: Sips with Meds  Diet effective midnight                   EDUCATION NEEDS:   Education needs have been addressed  Skin:  Skin Assessment: Reviewed RN Assessment  Last BM:  8/15  Height:   Ht Readings from Last 1 Encounters:  03/03/21 '5\' 11"'$  (1.803 m)    Weight:   Wt Readings from Last 1 Encounters:  03/10/21 (!) 198.4 kg     BMI:  Body mass index is 61 kg/m.  Estimated Nutritional Needs:   Kcal:  2300-3500 kcals  Protein:  150-170 g  Fluid:  1.2 L   Kerman Passey MS, RDN, LDN, CNSC Registered Dietitian III Clinical Nutrition RD Pager and On-Call Pager Number Located in [place]

## 2021-03-10 NOTE — Progress Notes (Signed)
Called by RN to assess pt at bedside. Pt is in chair, refusing to get back into bed, and is very labored. Rt and RN heavily recommended going on the BIPAP, however pt is very clear that he doesn't want to go on it. Rt placed pt on NRB per pt request. CCM made aware.

## 2021-03-10 NOTE — Progress Notes (Signed)
Medical Necessity for NIV:   Phillip Young presents with obesity hypoventilation syndrome.  The use of the NIV will treat patients high PCO2 levels and can reduce risk of exacerbations and future hospitalizations when used at night and during the day.  All alternate devices [phone] and [id number]) have not effectively provided the essential volume control necessary to maintain acceptable CO2 levels. An NIV with AVAPS AE is necessary to prevent patient harm.  Interruption or failure to provide NIV would quickly lead to exacerbation of the patients condition, hospital admission and further likely harm to the patient. Continued use is preferred.  He is able to protect their airways and clear secretions on their own.     Phillip Lose, NP Advanced Heart Failure

## 2021-03-10 NOTE — Progress Notes (Signed)
Charge RN notified of CVVH orders.  Per charge RN, will setup CVVH machine.  Patient notified of the need for CVVH and agreeable.

## 2021-03-10 NOTE — Progress Notes (Signed)
ANTICOAGULATION CONSULT NOTE - Follow Up Consult  Pharmacy Consult for heparin to bivalirudin Indication: atrial fibrillation  Labs: Recent Labs    03/08/21 0510 03/09/21 0325 03/09/21 2211 03/10/21 0509  HGB 9.7* 9.4* 9.9* 8.5*  HCT 29.9* 28.2* 29.0* 27.1*  PLT 121* 180  --  281  APTT 66* 76*  --  67*  CREATININE 8.77* 8.97*  --  6.41*     Assessment: 35yo male on heparin drip for AFib now in SR on amiodarone. Patient was on a heparin gtt, but developed symptoms of HIT and was changed to bivalirudin.   HIT Ab positive, SRA continues to be in process and started bivalirudin.  Bivalirudin 0.05 mg/kg/hr aptt 67 sec at goal (50-85) h/h stable  Platelets trending back up to normal. No bleeding issues noted. Getting started on HD currently.  Goal of Therapy:  aPTT 50-85 seconds   Plan:  Bivalirudin 0.'05mg'$ /kg/h Check aPTT daily  Erin Hearing PharmD., BCPS Clinical Pharmacist 03/10/2021 7:39 AM

## 2021-03-11 ENCOUNTER — Inpatient Hospital Stay (HOSPITAL_COMMUNITY): Payer: BC Managed Care – PPO

## 2021-03-11 ENCOUNTER — Inpatient Hospital Stay: Payer: Self-pay

## 2021-03-11 DIAGNOSIS — R570 Cardiogenic shock: Secondary | ICD-10-CM | POA: Diagnosis not present

## 2021-03-11 DIAGNOSIS — I5023 Acute on chronic systolic (congestive) heart failure: Secondary | ICD-10-CM | POA: Diagnosis not present

## 2021-03-11 LAB — RENAL FUNCTION PANEL
Albumin: 2.6 g/dL — ABNORMAL LOW (ref 3.5–5.0)
Albumin: 2.4 g/dL — ABNORMAL LOW (ref 3.5–5.0)
Anion gap: 9 (ref 5–15)
Anion gap: 8 (ref 5–15)
BUN: 33 mg/dL — ABNORMAL HIGH (ref 6–20)
BUN: 32 mg/dL — ABNORMAL HIGH (ref 6–20)
CO2: 26 mmol/L (ref 22–32)
CO2: 25 mmol/L (ref 22–32)
Calcium: 8.3 mg/dL — ABNORMAL LOW (ref 8.9–10.3)
Calcium: 8.1 mg/dL — ABNORMAL LOW (ref 8.9–10.3)
Chloride: 96 mmol/L — ABNORMAL LOW (ref 98–111)
Chloride: 96 mmol/L — ABNORMAL LOW (ref 98–111)
Creatinine, Ser: 3.6 mg/dL — ABNORMAL HIGH (ref 0.61–1.24)
Creatinine, Ser: 3.26 mg/dL — ABNORMAL HIGH (ref 0.61–1.24)
GFR, Estimated: 22 mL/min — ABNORMAL LOW (ref >60)
GFR, Estimated: 24 mL/min — ABNORMAL LOW (ref >60)
Glucose, Bld: 111 mg/dL — ABNORMAL HIGH (ref 70–99)
Glucose, Bld: 213 mg/dL — ABNORMAL HIGH (ref 70–99)
Phosphorus: 3.6 mg/dL (ref 2.5–4.6)
Phosphorus: 3.5 mg/dL (ref 2.5–4.6)
Potassium: 4.4 mmol/L (ref 3.5–5.1)
Potassium: 4.3 mmol/L (ref 3.5–5.1)
Sodium: 131 mmol/L — ABNORMAL LOW (ref 135–145)
Sodium: 129 mmol/L — ABNORMAL LOW (ref 135–145)

## 2021-03-11 LAB — CBC
HCT: 28.8 % — ABNORMAL LOW (ref 39.0–52.0)
Hemoglobin: 9 g/dL — ABNORMAL LOW (ref 13.0–17.0)
MCH: 29.6 pg (ref 26.0–34.0)
MCHC: 31.3 g/dL (ref 30.0–36.0)
MCV: 94.7 fL (ref 80.0–100.0)
Platelets: 322 10*3/uL (ref 150–400)
RBC: 3.04 MIL/uL — ABNORMAL LOW (ref 4.22–5.81)
RDW: 17.9 % — ABNORMAL HIGH (ref 11.5–15.5)
WBC: 14.9 10*3/uL — ABNORMAL HIGH (ref 4.0–10.5)
nRBC: 0.1 % (ref 0.0–0.2)

## 2021-03-11 LAB — CULTURE, BLOOD (ROUTINE X 2)
Culture: NO GROWTH
Culture: NO GROWTH

## 2021-03-11 LAB — COOXEMETRY PANEL
Carboxyhemoglobin: 1.2 % (ref 0.5–1.5)
Methemoglobin: 0.7 % (ref 0.0–1.5)
O2 Saturation: 65.8 %
Total hemoglobin: 12 g/dL (ref 12.0–16.0)

## 2021-03-11 LAB — MAGNESIUM: Magnesium: 1.9 mg/dL (ref 1.7–2.4)

## 2021-03-11 LAB — GLUCOSE, CAPILLARY
Glucose-Capillary: 118 mg/dL — ABNORMAL HIGH (ref 70–99)
Glucose-Capillary: 108 mg/dL — ABNORMAL HIGH (ref 70–99)
Glucose-Capillary: 137 mg/dL — ABNORMAL HIGH (ref 70–99)
Glucose-Capillary: 163 mg/dL — ABNORMAL HIGH (ref 70–99)

## 2021-03-11 LAB — CULTURE, BLOOD (SINGLE): Culture: NO GROWTH

## 2021-03-11 LAB — APTT: aPTT: 51 seconds — ABNORMAL HIGH (ref 24–36)

## 2021-03-11 LAB — SEROTONIN RELEASE ASSAY (SRA)
SRA .2 IU/mL UFH Ser-aCnc: 61 % — ABNORMAL HIGH (ref 0–20)
SRA 100IU/mL UFH Ser-aCnc: <1 % (ref 0–20)

## 2021-03-11 NOTE — Progress Notes (Signed)
Patient ID: Phillip Young, male   DOB: 12-04-1985, 35 y.o.   MRN: [id number]     Advanced Heart Failure Rounding Note  PCP-Cardiologist: Dr. [name]  Subjective:    Remains on DBA 3 and NE up to 10. CO-OX 66%.  After HD yesterday, developed increasing respiratory distress and put on NRBM.  CVP > 20, restarted on CVVH currently pulling 150-200 cc/hr net UF.  I/Os net negative 3084.  CVP about 19 this morning.     Tm 102.5, cultures sent and empirically started on vancomycin/cefepime.   He is back in atrial fibrillation rate 110s on amiodarone gtt 30 and bivalirudin.     Objective:   Weight Range: (!) 200.1 kg Body mass index is 61.53 kg/m.   Vital Signs:   Temp:  [98.3 F (36.8 C)-101.6 F (38.7 C)] 98.7 F (37.1 C) (08/17 0515) Pulse Rate:  [28-156] 113 (08/17 0708) Resp:  [18-42] 32 (08/17 0708) BP: (66-173)/(35-150) 164/98 (08/17 0708) SpO2:  [64 %-100 %] 95 % (08/17 0708) Weight:  [198.4 kg-200.1 kg] 200.1 kg (08/17 0500) Last BM Date: 03/09/21  Weight change: Filed Weights   03/10/21 0730 03/10/21 1042 03/11/21 0500  Weight: (!) 202.1 kg (!) 198.4 kg (!) 200.1 kg    Intake/Output:   Intake/Output Summary (Last 24 hours) at 03/11/2021 0738 Last data filed at 03/11/2021 [id number] Gross per 24 hour  Intake 4466.01 ml  Output 6600 ml  Net -2133.99 ml      Physical Exam  CVP 19.  General: NAD Neck: JVP 16 cm, no thyromegaly or thyroid nodule.  Lungs: Clear to auscultation bilaterally with normal respiratory effort. CV: Nonpalpable PMI.  Heart tachy, irregular S1/S2, no S3/S4, no murmur.  1+ edema to knees.  Abdomen: Soft, nontender, no hepatosplenomegaly, no distention.  Skin: Intact without lesions or rashes.  Neurologic: Alert and oriented x 3.  Psych: Normal affect. Extremities: No clubbing or cyanosis.  HEENT: Normal.    Telemetry  Atrial fibrillation 110s. Personally reviewed.   Labs    CBC Recent Labs    03/10/21 0509 03/11/21 0407  WBC 13.5*  14.9*  HGB 8.5* 9.0*  HCT 27.1* 28.8*  MCV 93.4 94.7  PLT 281 [id number]   Basic Metabolic Panel Recent Labs    03/10/21 0509 03/11/21 0407  NA 129* 131*  K 4.6 4.4  CL 96* 96*  CO2 23 26  GLUCOSE 128* 111*  BUN 60* 33*  CREATININE 6.41* 3.60*  CALCIUM 8.2* 8.3*  MG 2.0 1.9  PHOS 4.3 3.6   Liver Function Tests Recent Labs    03/10/21 0509 03/11/21 0407  ALBUMIN 2.6* 2.6*    No results for input(s): LIPASE, AMYLASE in the last 72 hours. Cardiac Enzymes No results for input(s): CKTOTAL, CKMB, CKMBINDEX, TROPONINI in the last 72 hours.  BNP: BNP (last 3 results) Recent Labs    10/10/20 1336 11/07/20 0932 02/11/21 1500  BNP 126.9* 106.0* 194.3*    ProBNP (last 3 results) No results for input(s): PROBNP in the last 8760 hours.   D-Dimer No results for input(s): DDIMER in the last 72 hours.  Hemoglobin A1C No results for input(s): HGBA1C in the last 72 hours.  Fasting Lipid Panel No results for input(s): CHOL, HDL, LDLCALC, TRIG, CHOLHDL, LDLDIRECT in the last 72 hours. Thyroid Function Tests No results for input(s): TSH, T4TOTAL, T3FREE, THYROIDAB in the last 72 hours.  Invalid input(s): FREET3   Other results:   Imaging    IR Fluoro Guide CV Line Right  Result Date: 03/10/2021 INDICATION: Acute kidney injury EXAM: Ultrasound and fluoroscopy guided tunneled dialysis catheter placement MEDICATIONS: Ancef 3 gm IV; The antibiotic was administered within an appropriate time interval prior to skin puncture. ANESTHESIA/SEDATION: None FLUOROSCOPY TIME:  Fluoroscopy Time: 1 minutes of 54 seconds (74 mGy). COMPLICATIONS: None immediate. PROCEDURE: Informed written consent was obtained from the patient after a thorough discussion of the procedural risks, benefits and alternatives. All questions were addressed. Maximal Sterile Barrier Technique was utilized including caps, mask, sterile gowns, sterile gloves, sterile drape, hand hygiene and skin antiseptic. A timeout  was performed prior to the initiation of the procedure. The right internal jugular vein was evaluated with ultrasound and shown to be patent. A permanent ultrasound image was obtained and placed in the patient's medical record. Using sterile gel and a sterile probe cover, the right internal jugular vein was entered with a 21 ga needle during real time ultrasound guidance. 0.018 inch guidewire placed and 21 ga needle exchanged for transitional dilator set. Utilizing fluoroscopy, 0.035 inch guidewire advanced through the needle without difficulty. Seriel dilation was performed and peel-away sheath was placed. Attention then turned to the right anterior upper chest. Following local lidocaine administration, the hemodialysis catheter was tunneled from the chest wall to the venotomy site. The catheter was inserted through the peel-away sheath. The tip of the catheter was positioned within the right atrium using fluoroscopic guidance. All lumens of the catheter aspirated and flushed well. The dialysis lumens were locked with sodium citrate. The catheter was secured to the skin with suture. The insertion site was covered with a Biopatch and sterile dressing. IMPRESSION: 23 cm right IJ tunneled hemodialysis catheter is ready for use. Electronically Signed   By: Miachel Roux M.D.   On: 03/10/2021 15:39   IR US Guide Vasc Access Right  Result Date: 03/10/2021 INDICATION: Acute kidney injury EXAM: Ultrasound and fluoroscopy guided tunneled dialysis catheter placement MEDICATIONS: Ancef 3 gm IV; The antibiotic was administered within an appropriate time interval prior to skin puncture. ANESTHESIA/SEDATION: None FLUOROSCOPY TIME:  Fluoroscopy Time: 1 minutes of 54 seconds (74 mGy). COMPLICATIONS: None immediate. PROCEDURE: Informed written consent was obtained from the patient after a thorough discussion of the procedural risks, benefits and alternatives. All questions were addressed. Maximal Sterile Barrier Technique was  utilized including caps, mask, sterile gowns, sterile gloves, sterile drape, hand hygiene and skin antiseptic. A timeout was performed prior to the initiation of the procedure. The right internal jugular vein was evaluated with ultrasound and shown to be patent. A permanent ultrasound image was obtained and placed in the patient's medical record. Using sterile gel and a sterile probe cover, the right internal jugular vein was entered with a 21 ga needle during real time ultrasound guidance. 0.018 inch guidewire placed and 21 ga needle exchanged for transitional dilator set. Utilizing fluoroscopy, 0.035 inch guidewire advanced through the needle without difficulty. Seriel dilation was performed and peel-away sheath was placed. Attention then turned to the right anterior upper chest. Following local lidocaine administration, the hemodialysis catheter was tunneled from the chest wall to the venotomy site. The catheter was inserted through the peel-away sheath. The tip of the catheter was positioned within the right atrium using fluoroscopic guidance. All lumens of the catheter aspirated and flushed well. The dialysis lumens were locked with sodium citrate. The catheter was secured to the skin with suture. The insertion site was covered with a Biopatch and sterile dressing. IMPRESSION: 23 cm right IJ tunneled hemodialysis catheter is ready for  use. Electronically Signed   By: Miachel Roux M.D.   On: 03/10/2021 15:39   DG CHEST PORT 1 VIEW  Result Date: 03/10/2021 CLINICAL DATA:  Shortness of breath. EXAM: PORTABLE CHEST 1 VIEW COMPARISON:  03/09/2021 FINDINGS: New right IJ dialysis catheter in good position without complicating features. The left IJ catheter is stable. Stable cardiac enlargement and prominent mediastinal and hilar contours. Stable diffuse interstitial and airspace process in the lungs, likely pulmonary edema. No obvious/large pleural effusions and no pneumothorax. IMPRESSION: Central lines in good  position without complicating features. Stable cardiac enlargement and diffuse interstitial and airspace process in lungs. Electronically Signed   By: Marijo Sanes M.D.   On: 03/10/2021 17:22     Medications:     Scheduled Medications:  (feeding supplement) PROSource Plus  30 mL Oral TID BM   budesonide (PULMICORT) nebulizer solution  0.25 mg Nebulization BID   Chlorhexidine Gluconate Cloth  6 each Topical Daily   Chlorhexidine Gluconate Cloth  6 each Topical Q0600   insulin aspart  0-5 Units Subcutaneous QHS   insulin aspart  0-9 Units Subcutaneous TID WC   mouth rinse  15 mL Mouth Rinse BID   midodrine  15 mg Oral TID WC   multivitamin  1 tablet Oral QHS   polyethylene glycol  17 g Oral Daily   senna-docusate  2 tablet Oral BID   sodium chloride flush  10-40 mL Intracatheter Q12H   sodium chloride flush  3 mL Intravenous Q12H   sodium chloride flush  3 mL Intravenous Q12H   torsemide  80 mg Oral BID    Infusions:   prismasol BGK 4/2.5 500 mL/hr at 03/11/21 [id number]    prismasol BGK 4/2.5 200 mL/hr at 03/10/21 2130   sodium chloride Stopped (02/27/21 1651)   sodium chloride     sodium chloride     sodium chloride     amiodarone 30 mg/hr (03/11/21 0700)   bivalirudin (ANGIOMAX) infusion 0.5 mg/mL (Non-ACS indications) 0.05 mg/kg/hr (03/11/21 0700)   ceFEPime (MAXIPIME) IV     DOBUTamine 3 mcg/kg/min (03/11/21 0700)   norepinephrine (LEVOPHED) Adult infusion 12 mcg/min (03/11/21 0700)   prismasol BGK 4/2.5 1,500 mL/hr at 03/11/21 [id number]   vancomycin      PRN Medications: sodium chloride, sodium chloride, sodium chloride, sodium chloride, acetaminophen **OR** acetaminophen, alteplase, camphor-menthol, diphenhydrAMINE, guaiFENesin-dextromethorphan, ipratropium, lidocaine (PF), lidocaine-prilocaine, LORazepam, menthol-cetylpyridinium, ondansetron (ZOFRAN) IV, oxyCODONE, pentafluoroprop-tetrafluoroeth, sodium chloride, sodium chloride, sodium chloride flush, sodium chloride flush,  sodium chloride flush, sorbitol  Assessment/Plan   1. Acute on Chronic Biventricular Heart Failure -> cardiogenic shock:  Known NICM since 2019, previously followed by [facility].  Echo 8/21 EF < 20%, moderate LV dilation, severely decreased RV function, severe biatrial enlargement, mild MR. No history of ETOH/drugs.  No FH of cardiomyopathy (mother with [place]).  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  He is too large for cardiac MRI. RHC/LHC on 03/04/20 with no CAD low CI at 2.1. Evaluated by EP but not a candidate for ICD given BMI.  Echo this admission with EF < 20%, RV poorly Dev eloped cardiogenic shock -> AKI in setting of AFL with RVR. Dobutamine started at 2.5 and increased to 7.5.  NE added to support CVVHD. He was transitioned to [facility] but unable to tolerate this with rising CVP and respiratory distress, inability to comply with fluid restriction.  Restarted CVVH last night, CVP was >20, now down to 19.  He remains on dobutamine 3 +  NE 10 with co-ox 66%.  - Continue CVVH, aiming for net negative UF 150-200 cc/hr today.  - We will try aggressive CVVH 1 more time, if he fails transition to iHD again, we are out of options. Not candidate for advanced therapies. This has been explained to him several times. If fails to tolerated iHD off inotopes, he will need hospice.  - With AKI/hypotension, stopped Entresto, dapagliflozin, spironolactone, digoxin.  2. Aflutter/fibrillation w/ RVR:  Noted to be in Afib during a prior admission in 3/22 in the setting of CAP and a/c CHF, but converted to NSR on amiodarone. Discharged home w/ eliquis. Was in 2:1 AFL this admit in setting of a/c CHF w/ marked volume overload, + recent gastroenteritis w/ diarrhea. Has OSA but not using CPAP. TSH WNL. Had DCCV on 8/5. He is now back in atrial fibrillation rate 110s.  - Continue amiodarone gtt.  - Heparin switched to bivalirudin due to HIT. Will switch to apixaban when more  stable off NRBM.  3. AKI on CKD IV: Likely ATN/cardiorenal.CVVHD stopped on 8/9 after catheter clotted.  iHD started on 8/11 with 2L of volume removed. Trialysis cath switched 8/12. Tolerated iHD on 8/13 and had dialysis catheter placed 8/16.  On 8/16, CVVH restarted with respiratory distress.  - As above, 1 more trial of CVVH to get fluid off again. If cannot tolerate iHD off pressors will be hospice situation 4. Acute Hypoxic Respiratory Failure: Suspect underlying OHS/OSA exacerbated by CHF this admission. Still with significant O2 requirement => NRBM this morning.  - Continue oxygen  - Refusing Bipap at night - Volume removal with CVVH.  5. Type 2 DM: Farxiga held with AKI.  - continue SSI 6. OSA: Has not been on Bipap at home.  7. Super Morbid Obesity: Body mass index is 61.53 kg/m. 8. UTI: Suspect by UA, sent urine culture.  - Ceftriaxone IV course completed.  9. Hyponatremia: Improved with CVVH. - He has been counseled on free water restriction numerous times 10. ID: Febrile to 102.5 overnight, started empirically on vancomycin/cefepime.  Pending cultures.  11. 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 Platelets recovered.   - Continue bivalirudin, eventually will switch to Eliquis   Extensive discussion again about need for po fluid compliance and lack of good options if he fails iHD again.   CRITICAL CARE Performed by: [name]  Total critical care time: 40 minutes  Critical care time was exclusive of separately billable procedures and treating other patients.  Critical care was necessary to treat or prevent imminent or life-threatening deterioration.  Critical care was time spent personally by me on the following activities: development of treatment plan with patient and/or surrogate as well as nursing, discussions with consultants, evaluation of patient's response to treatment, examination of patient, obtaining  history from patient or surrogate, ordering and performing treatments and interventions, ordering and review of laboratory studies, ordering and review of radiographic studies, pulse oximetry and re-evaluation of patient's condition.  [name] 03/11/2021 7:38 AM

## 2021-03-11 NOTE — Progress Notes (Signed)
Patient ID: Phillip Young, male   DOB: 31-Jul-1985, 35 y.o.   MRN: [id number] [facility] Progress Note   Assessment/ Plan:   1. Acute kidney Injury: Secondary to cardiogenic shock/hemodynamically mediated in the setting of atrial flutter with RVR and underlying severe biventricular congestive heart failure.  Attempted transition to intermittent dialysis with daily hemodialysis on Monday and Tuesday with continued daily net fluid positive balance and increasing dyspnea.  Yesterday with increased hemodynamic instability and dyspnea prompting restarting of CRRT.  This will be the final attempt to try and get him (close to) euvolemic and assess his candidacy to transition to intermittent hemodialysis which he has essentially failed earlier in the week.  Reminded about limiting fluid intake. 2.  Hyponatremia: Secondary to CHF/impaired free water handling with poorly restricted free water intake-restarted torsemide yesterday.  Poor prognostic indicator. 3.  Heparin-induced thrombocytopenia: Transitioned to bivalirudin from heparin after positive HIT panel on 8/11. 4.  Biventricular congestive heart failure: Transitioned from CRRT to intermittent hemodialysis and I will restart torsemide today to augment urine output.  Not a candidate for advanced therapies due to morbid obesity. 5.  Atrial flutter with RVR: He underwent cardioversion earlier but now back in atrial flutter with occasional tachycardia, on amiodarone and bivalirudin.  Subjective:   Had increasing hypotension, shortness of breath and fever after TDC placement by IR yesterday.  Restarted on CRRT at the request of Dr. [name] as a final effort to see if we can improve volume status/hemodynamic status.   Objective:   BP (!) 119/103   Pulse (!) 42   Temp 98.7 F (37.1 C) (Oral)   Resp (!) 29   Ht '5\' 11"'$  (1.803 m)   Wt (!) 200.1 kg   SpO2 (!) 88%   BMI 61.53 kg/m   Intake/Output Summary (Last 24 hours) at 03/11/2021 [id number] Last  data filed at 03/11/2021 [id number] Gross per 24 hour  Intake 3784.31 ml  Output 6644 ml  Net -2859.69 ml   Weight change: 4.4 kg  Physical Exam: Gen: Morbidly obese man, sitting up in recliner, oxygen via facemask CVS: Pulse regular rhythm and normal rate, S1 and S2 normal, right IJ TDC connected to CRRT Resp: Coarse breath sounds bilaterally with rhonchi Abd: Soft, obese, nontender Ext: 2+ anasarca.    Imaging: IR Fluoro Guide CV Line Right  Result Date: 03/10/2021 INDICATION: Acute kidney injury EXAM: Ultrasound and fluoroscopy guided tunneled dialysis catheter placement MEDICATIONS: Ancef 3 gm IV; The antibiotic was administered within an appropriate time interval prior to skin puncture. ANESTHESIA/SEDATION: None FLUOROSCOPY TIME:  Fluoroscopy Time: 1 minutes of 54 seconds (74 mGy). COMPLICATIONS: None immediate. PROCEDURE: Informed written consent was obtained from the patient after a thorough discussion of the procedural risks, benefits and alternatives. All questions were addressed. Maximal Sterile Barrier Technique was utilized including caps, mask, sterile gowns, sterile gloves, sterile drape, hand hygiene and skin antiseptic. A timeout was performed prior to the initiation of the procedure. The right internal jugular vein was evaluated with ultrasound and shown to be patent. A permanent ultrasound image was obtained and placed in the patient's medical record. Using sterile gel and a sterile probe cover, the right internal jugular vein was entered with a 21 ga needle during real time ultrasound guidance. 0.018 inch guidewire placed and 21 ga needle exchanged for transitional dilator set. Utilizing fluoroscopy, 0.035 inch guidewire advanced through the needle without difficulty. Seriel dilation was performed and peel-away sheath was placed. Attention then turned to the right anterior upper  chest. Following local lidocaine administration, the hemodialysis catheter was tunneled from the chest wall  to the venotomy site. The catheter was inserted through the peel-away sheath. The tip of the catheter was positioned within the right atrium using fluoroscopic guidance. All lumens of the catheter aspirated and flushed well. The dialysis lumens were locked with sodium citrate. The catheter was secured to the skin with suture. The insertion site was covered with a Biopatch and sterile dressing. IMPRESSION: 23 cm right IJ tunneled hemodialysis catheter is ready for use. Electronically Signed   By: Miachel Roux M.D.   On: 03/10/2021 15:39   IR US Guide Vasc Access Right  Result Date: 03/10/2021 INDICATION: Acute kidney injury EXAM: Ultrasound and fluoroscopy guided tunneled dialysis catheter placement MEDICATIONS: Ancef 3 gm IV; The antibiotic was administered within an appropriate time interval prior to skin puncture. ANESTHESIA/SEDATION: None FLUOROSCOPY TIME:  Fluoroscopy Time: 1 minutes of 54 seconds (74 mGy). COMPLICATIONS: None immediate. PROCEDURE: Informed written consent was obtained from the patient after a thorough discussion of the procedural risks, benefits and alternatives. All questions were addressed. Maximal Sterile Barrier Technique was utilized including caps, mask, sterile gowns, sterile gloves, sterile drape, hand hygiene and skin antiseptic. A timeout was performed prior to the initiation of the procedure. The right internal jugular vein was evaluated with ultrasound and shown to be patent. A permanent ultrasound image was obtained and placed in the patient's medical record. Using sterile gel and a sterile probe cover, the right internal jugular vein was entered with a 21 ga needle during real time ultrasound guidance. 0.018 inch guidewire placed and 21 ga needle exchanged for transitional dilator set. Utilizing fluoroscopy, 0.035 inch guidewire advanced through the needle without difficulty. Seriel dilation was performed and peel-away sheath was placed. Attention then turned to the right  anterior upper chest. Following local lidocaine administration, the hemodialysis catheter was tunneled from the chest wall to the venotomy site. The catheter was inserted through the peel-away sheath. The tip of the catheter was positioned within the right atrium using fluoroscopic guidance. All lumens of the catheter aspirated and flushed well. The dialysis lumens were locked with sodium citrate. The catheter was secured to the skin with suture. The insertion site was covered with a Biopatch and sterile dressing. IMPRESSION: 23 cm right IJ tunneled hemodialysis catheter is ready for use. Electronically Signed   By: Miachel Roux M.D.   On: 03/10/2021 15:39   DG CHEST PORT 1 VIEW  Result Date: 03/10/2021 CLINICAL DATA:  Shortness of breath. EXAM: PORTABLE CHEST 1 VIEW COMPARISON:  03/09/2021 FINDINGS: New right IJ dialysis catheter in good position without complicating features. The left IJ catheter is stable. Stable cardiac enlargement and prominent mediastinal and hilar contours. Stable diffuse interstitial and airspace process in the lungs, likely pulmonary edema. No obvious/large pleural effusions and no pneumothorax. IMPRESSION: Central lines in good position without complicating features. Stable cardiac enlargement and diffuse interstitial and airspace process in lungs. Electronically Signed   By: Marijo Sanes M.D.   On: 03/10/2021 17:22   DG CHEST PORT 1 VIEW  Result Date: 03/09/2021 CLINICAL DATA:  Acute shortness of breath, respiratory failure EXAM: PORTABLE CHEST 1 VIEW COMPARISON:  03/07/2021 FINDINGS: Bilateral diffuse interstitial and alveolar airspace opacities. No pleural effusion or pneumothorax. Stable cardiomegaly. No acute osseous abnormality. IMPRESSION: CHF unchanged compared with 03/07/2021. Electronically Signed   By: Kathreen Devoid M.D.   On: 03/09/2021 18:13    Labs: BMET Recent Labs  Lab 03/05/21 0029 03/06/21  [id number] 03/06/21 1403 03/06/21 1404 03/07/21 0400 03/08/21 0510  03/09/21 0325 03/09/21 2211 03/10/21 0509 03/11/21 0407  NA 120* 121*   < > 122* 119* 121* 120* 129* 129* 131*  K 4.7 4.3   < > 4.8 4.2 4.4 4.8 4.5 4.6 4.4  CL 85* 87*  --   --  86* 88* 87*  --  96* 96*  CO2 19* 19*  --   --  18* 20* 19*  --  23 26  GLUCOSE 95 134*  --   --  110* 117* 97  --  128* 111*  BUN 52* 47*  --   --  61* 69* 77*  --  60* 33*  CREATININE 7.36* 7.08*  --   --  8.56* 8.77* 8.97*  --  6.41* 3.60*  CALCIUM 9.2 8.3*  --   --  8.4* 8.5* 8.7*  --  8.2* 8.3*  PHOS 6.4* 5.7*  --   --  6.6* 6.1* 7.2*  --  4.3 3.6   < > = values in this interval not displayed.   CBC Recent Labs  Lab 03/08/21 0510 03/09/21 0325 03/09/21 2211 03/10/21 0509 03/11/21 0407  WBC 15.2* 13.5*  --  13.5* 14.9*  HGB 9.7* 9.4* 9.9* 8.5* 9.0*  HCT 29.9* 28.2* 29.0* 27.1* 28.8*  MCV 92.3 92.2  --  93.4 94.7  PLT 121* 180  --  281 322    Medications:     (feeding supplement) PROSource Plus  30 mL Oral TID BM   budesonide (PULMICORT) nebulizer solution  0.25 mg Nebulization BID   Chlorhexidine Gluconate Cloth  6 each Topical Daily   Chlorhexidine Gluconate Cloth  6 each Topical Q0600   insulin aspart  0-5 Units Subcutaneous QHS   insulin aspart  0-9 Units Subcutaneous TID WC   mouth rinse  15 mL Mouth Rinse BID   midodrine  15 mg Oral TID WC   multivitamin  1 tablet Oral QHS   polyethylene glycol  17 g Oral Daily   senna-docusate  2 tablet Oral BID   sodium chloride flush  10-40 mL Intracatheter Q12H   sodium chloride flush  3 mL Intravenous Q12H   sodium chloride flush  3 mL Intravenous Q12H   torsemide  80 mg Oral BID   Elmarie Shiley, MD 03/11/2021, 6:52 AM

## 2021-03-11 NOTE — Progress Notes (Addendum)
NAME:  Phillip Young, MRN:  [id number], DOB:  06-25-1986, LOS: 45 ADMISSION DATE:  02/11/2021, CONSULTATION DATE:  02/15/21 REFERRING MD:  Harvin Hazel HF  -- Phillip Young , CHIEF COMPLAINT:  decompensated heart failure, renal failure    History of Present Illness:  35 yo M PMH class V obesity, biventricular heart failure, HTN, OSA presented to ED 7/20 with CC SOB, palpitations x 1 week. Associated dry cough, intermittent hypoxia, edema. Admitted to [facility] for management of acute on chronic heart failure, rapid aflutter. Got '80mg'$  IV lasix, amio. 7/21 Adv HF consulted for further assistance. 7/24 patient in the CVICU on a lasix infusion as well as dobutamine without robust response to diuresis (notably patient is refusing foley catheter placement). He was started on norepi 7/24 with persistent soft Bps amd worsening AKI, volume overload.   He improved somewhat with pressors and inotropes and PCCM signed off, however has had a prolonged hospital stay and on 8/16 had worsening volume overload and respiratory distress and was refusing to wear his CPAP so PCCM re-consulted in this setting   Pertinent  Medical History  Biventricular heart failure OSA Class V obesity GERD HTN DM2  Significant Hospital Events: Including procedures, antibiotic start and stop dates in addition to other pertinent events   7/20 admitted to [facility] for Afl RVR, acute on chronic BiV HF, hypoxia. Amio, push dose lasix  7/21 adv HF consult, started dobutamine, lasix gtt  7/22 ECHO with LVEF <20% (worse than prior). Hard to assess RV with technically difficult study  7/24 adding NE. CCM consult. Worse renal function, on lasix gtt but teetering need for CRRT  7/25 CRRT pulling 150/hr 8/16 Tunneled Hd catheter placed by IR.  Developed fever and worsening dyspnea requiring 15L O2, refusing Bipap, PCCM reconsulted  Interim History / Subjective:  Transferred to ICU with respiratory distress, CVP 20, re-started on CRRT, Overnight with A.Fib, on  amiodarone gtt. Tmax 102.5 started on Cefepime/Vancomycin  Objective   Blood pressure (!) 164/98, pulse (!) 113, temperature 98.7 F (37.1 C), temperature source Oral, resp. rate (!) 32, height '5\' 11"'$  (1.803 m), weight (!) 200.1 kg, SpO2 95 %. CVP:  [9 mmHg-23 mmHg] 23 mmHg      Intake/Output Summary (Last 24 hours) at 03/11/2021 0743 Last data filed at 03/11/2021 [id number] Gross per 24 hour  Intake 4466.01 ml  Output 6600 ml  Net -2133.99 ml   Filed Weights   03/10/21 0730 03/10/21 1042 03/11/21 0500  Weight: (!) 202.1 kg (!) 198.4 kg (!) 200.1 kg   General:  obese male, sitting in chair, no distress  HEENT: MM pink/moist Neuro: awake, and alert, oriented and following commands CV: Tachy, HR 118, Irregular, no MRG  PULM: Coarse breath sounds, crackles to bases, no use of accessory of muscles  GI: obese, non-tender, active bowel sounds  Extremities: warm/dry, +2 edema BLE  Skin: no rashes or lesions  Resolved Hospital Problem list    Assessment & Plan:   Acute hypoxemic respiratory failure on Home O2, Pulmonary edema.  OSA/OHS  With baseline poor CPAP compliance Continued pulmonary edema and above dry weight P: -pt encouraged to wear CPAP, agrees to intubation if declines  -Pulmicort and Ipratropium nebs -CRRT as below   Acute on chronic biventricular decompensated heart failure with pulmonary edema and cardiogenic shock EF <20% P:  -management per primary team, on dobutamine 3 mg/kg/min and 10 mcg/min levophed -continues on Midodrine 15 mg TID  -not a candidate for advanced therapies   Atrial flutter  w RVR P: -remains on amiodarone  -heparin switched to bivalirudin 2/2 HIT > plans to change to apixaban once respiratory status stable   Cardiorenal syndrome:  Pt unable to tolerate iHD Tunneled dialysis catheter placed 8/16 P: -Nephrology Following, Continues on CRRT  Fever Fever curve trending up today with TMAX 99.7, completed course of Ceftriaxone earlier in  hospital course for UTI P: -Trend WBC and Fever Curve, Follow Culture Data, continues on Cefepime/Vancomycin  -Plan to place PICC and remove Left IJ HD Cath   Best Practice (right click and "Reselect all SmartList Selections" daily)   Per primary  CRITICAL CARE Performed by: Omar Person  Total critical care time: 32 minutes  Critical care time was exclusive of separately billable procedures and treating other patients.  Critical care was necessary to treat or prevent imminent or life-threatening deterioration.  Critical care was time spent personally by me on the following activities: development of treatment plan with patient and/or surrogate as well as nursing, discussions with consultants, evaluation of patient's response to treatment, examination of patient, obtaining history from patient or surrogate, ordering and performing treatments and interventions, ordering and review of laboratory studies, ordering and review of radiographic studies, pulse oximetry and re-evaluation of patient's condition.  Hayden Pedro, AGACNP-BC [facility]  PCCM Pgr: [phone]   PCCM Attending:   35 year old male, past medical history of obesity biventricular failure, hypertension OSA.  Has acute hypoxemic respiratory failure on oxygen therapy.  Not tolerant of BiPAP.  Has chronic biventricular failure, atrial flutter with RVR, cardiorenal syndrome now intolerant of IHD on CVVHD.  BP (!) 92/42   Pulse (!) 118   Temp 98.9 F (37.2 C) (Oral)   Resp (!) 30   Ht '5\' 11"'$  (1.803 m)   Wt (!) 200.1 kg   SpO2 97%   BMI 61.53 kg/m   General: Obese male resting in bed, conversant HEENT: Large neck, right IJ catheter in place Heart: Regular rhythm S1-S2 none lungs: Clear to auscultation bilaterally, diminished breath sounds bilaterally Abdomen: Large obese pannus soft  Labs: Reviewed  Assessment: Acute hypoxemic respiratory failure requiring home oxygen  therapy OSA/OHS Acute on chronic biventricular decompensated heart failure cardiogenic shock bilateral pulmonary edema with reduced LVEF. Atrial flutter with RVR Cardiorenal syndrome requiring CVVHD  Plan: Remains on continuous vasopressor support Continue dobutamine Continue Levophed Continue midodrine 15 mg 3 times daily Continue CVVHD for volume removal  Overall needs ongoing palliative care discussions. He had a new fever yesterday. We need to pull his left HD catheter. He has a new tunneled catheter in place for CVVHD. He can have a PICC for his pressor requirements. PICC line has been team has been consulted.  This patient is critically ill with multiple organ system failure; which, requires frequent high complexity decision making, assessment, support, evaluation, and titration of therapies. This was completed through the application of advanced monitoring technologies and extensive interpretation of multiple databases. During this encounter critical care time was devoted to patient care services described in this note for 32 minutes.  Garner Nash, DO [facility] 03/11/2021 11:32 AM

## 2021-03-11 NOTE — Progress Notes (Signed)
ANTICOAGULATION CONSULT NOTE - Follow Up Consult  Pharmacy Consult for bivalirudin Indication: atrial fibrillation  Labs: Recent Labs    03/09/21 0325 03/09/21 2211 03/10/21 0509 03/11/21 0407  HGB 9.4* 9.9* 8.5* 9.0*  HCT 28.2* 29.0* 27.1* 28.8*  PLT 180  --  281 322  APTT 76*  --  67* 51*  CREATININE 8.97*  --  6.41* 3.60*     Assessment: 35yo male on heparin drip for AFib now in SR on amiodarone. Patient was on a heparin gtt, but developed symptoms of HIT and was changed to bivalirudin.   HIT Ab positive, SRA continues to be in process and he continues on bivalirudin.  Bivalirudin 0.05 mg/kg/hr aptt 51 sec at goal, although trended to low end.(50-85) h/h stable  Platelets trending back up to normal. No bleeding issues noted. Back on crrt and abx overnight.  Goal of Therapy:  aPTT 50-85 seconds   Plan:  Increase Bivalirudin to 0.'06mg'$ /kg/h Check aPTT daily  Erin Hearing PharmD., BCPS Clinical Pharmacist 03/11/2021 9:05 AM

## 2021-03-11 NOTE — Progress Notes (Signed)
PT Cancellation Note  Patient Details Name: Phillip Young MRN: [id number] DOB: 18-Aug-1985   Cancelled Treatment:    Reason Eval/Treat Not Completed: Patient declined, no reason specified.  Pt on CRRT, face mask, asking for water.  Declined therapy today though is appropriate for limited therapy within area of the CRRT. 03/11/2021  Ginger Carne., PT Acute Rehabilitation Services [phone]  (pager) [phone]  (office)   Tessie Fass Angeletta Goelz 03/11/2021, 2:38 PM

## 2021-03-11 NOTE — Progress Notes (Signed)
Palliative-   Phillip Young is sleeping heavily on my exam and does not arouse.  Chart reviewed and decompensation noted- back on CRRT.  Appears limits have been set by attending team- trial CRRT, then trial iHD one more time. If he fails then only option is end of life care.   PMT Phillip Young visit again tomorrow in efforts to support Phillip Young. He has previously expressed his desire for all offered medical interventions that Phillip Young prolong his life.   Mariana Kaufman, AGNP-C Palliative Medicine  No charge

## 2021-03-11 NOTE — Progress Notes (Signed)
Peripherally Inserted Central Catheter Placement  The IV Nurse has discussed with the patient and/or persons authorized to consent for the patient, the purpose of this procedure and the potential benefits and risks involved with this procedure.  The benefits include less needle sticks, lab draws from the catheter, and the patient may be discharged home with the catheter. Risks include, but not limited to, infection, bleeding, blood clot (thrombus formation), and puncture of an artery; nerve damage and irregular heartbeat and possibility to perform a PICC exchange if needed/ordered by physician.  Alternatives to this procedure were also discussed.  Bard Power PICC patient education guide, fact sheet on infection prevention and patient information card has been provided to patient /or left at bedside.    PICC Placement Documentation  PICC Triple Lumen 03/11/21 PICC Right Cephalic [id number] cm 1 cm (Active)  Site Assessment Clean;Dry;Intact 03/11/21 1339  Lumen #1 Status Flushed;Blood return noted;Saline locked 03/11/21 1339  Lumen #2 Status Flushed;Saline locked;Blood return noted 03/11/21 1339  Lumen #3 Status Blood return noted;Saline locked;Flushed 03/11/21 1339  Dressing Type Transparent;Securing device 03/11/21 1339  Antimicrobial disc in place? Yes 03/11/21 1339  Safety Lock Not Applicable 0000000 0000000  Dressing Change Due 03/18/21 03/11/21 1339       Frances Maywood 03/11/2021, 1:42 PM

## 2021-03-11 NOTE — Progress Notes (Signed)
Pharmacy Heparin Induced Thrombocytopenia (HIT) Note:  Phillip Young is an 35 y.o. male being evaluated for HIT. Heparin was started 7/30 for possible procedures while holding apixaban for AF, and baseline platelets were 208.   HIT labs were ordered on 8/10 when platelets dropped to 52.  Auto-populate labs:  Heparin Induced Plt Ab  Date/Time Value Ref Range Status  03/04/2021 12:14 PM 2.335 (H) 0.000 - 0.400 OD Final    Comment:    (NOTE) Performed At: [facility] [address] [place], [place] [id number] [name] [id number]    [place] .2 IU/mL UFH Ser-aCnc  Date/Time Value Ref Range Status  03/05/2021 05:34 PM 61 (H) 0 - 20 % Final    Comment:    (NOTE) This test was developed and its performance characteristics determined by [facility]. It has not been cleared or approved by the Food and Drug Administration.    SRA 100IU/mL UFH Ser-aCnc  Date/Time Value Ref Range Status  03/05/2021 05:34 PM <1 0 - 20 % Final    Comment:    (NOTE) This test was developed and its performance characteristics determined by [facility]. It has not been cleared or approved by the Food and Drug Administration.      CALCULATE SCORE:  4Ts (see the HIT Algorithm) Score  Thrombocytopenia 2  Timing 2  Thrombosis 0  Other causes of thrombocytopenia 0  Total 4     Recommendations (A or B) are based on available lab results (HIT antibody and/or SRA) and the HIT algorithm    A. HIT antibody result available  Possible HIT   Order SRA:  Yes Discontinue heparin / LMWH:  Yes Initiate alternative anticoagulation:  Yes Document heparin allergy:  Yes   B. SRA result availability  SRA positive with confirmed HIT:   Initiate alternative anticoagulation Document heparin allergy Consider oral anticoagulation   Name of MD Contacted: advanced HF service  Plan (Discussed with provider) Labs ordered:  SRA ordered  Heparin allergy:  Heparin allergy documented or  updated. Anticoagulation plans:  Begin alternative anticoagulation with bivalirudin  Erin Hearing PharmD., BCPS Clinical Pharmacist 03/11/2021 12:51 PM

## 2021-03-12 DIAGNOSIS — I5023 Acute on chronic systolic (congestive) heart failure: Secondary | ICD-10-CM | POA: Diagnosis not present

## 2021-03-12 DIAGNOSIS — I4819 Other persistent atrial fibrillation: Secondary | ICD-10-CM

## 2021-03-12 LAB — RENAL FUNCTION PANEL
Albumin: 2.5 g/dL — ABNORMAL LOW (ref 3.5–5.0)
Albumin: 2.5 g/dL — ABNORMAL LOW (ref 3.5–5.0)
Anion gap: 14 (ref 5–15)
Anion gap: 11 (ref 5–15)
BUN: 27 mg/dL — ABNORMAL HIGH (ref 6–20)
BUN: 28 mg/dL — ABNORMAL HIGH (ref 6–20)
CO2: 23 mmol/L (ref 22–32)
CO2: 22 mmol/L (ref 22–32)
Calcium: 8.8 mg/dL — ABNORMAL LOW (ref 8.9–10.3)
Calcium: 8.3 mg/dL — ABNORMAL LOW (ref 8.9–10.3)
Chloride: 93 mmol/L — ABNORMAL LOW (ref 98–111)
Chloride: 96 mmol/L — ABNORMAL LOW (ref 98–111)
Creatinine, Ser: 3.09 mg/dL — ABNORMAL HIGH (ref 0.61–1.24)
Creatinine, Ser: 2.92 mg/dL — ABNORMAL HIGH (ref 0.61–1.24)
GFR, Estimated: 26 mL/min — ABNORMAL LOW (ref >60)
GFR, Estimated: 28 mL/min — ABNORMAL LOW (ref >60)
Glucose, Bld: 195 mg/dL — ABNORMAL HIGH (ref 70–99)
Glucose, Bld: 217 mg/dL — ABNORMAL HIGH (ref 70–99)
Phosphorus: 2.4 mg/dL — ABNORMAL LOW (ref 2.5–4.6)
Phosphorus: 2 mg/dL — ABNORMAL LOW (ref 2.5–4.6)
Potassium: 4.3 mmol/L (ref 3.5–5.1)
Potassium: 4.2 mmol/L (ref 3.5–5.1)
Sodium: 130 mmol/L — ABNORMAL LOW (ref 135–145)
Sodium: 129 mmol/L — ABNORMAL LOW (ref 135–145)

## 2021-03-12 LAB — COOXEMETRY PANEL
Carboxyhemoglobin: 1 % (ref 0.5–1.5)
Methemoglobin: 0.7 % (ref 0.0–1.5)
O2 Saturation: 54.7 %
Total hemoglobin: 9.5 g/dL — ABNORMAL LOW (ref 12.0–16.0)

## 2021-03-12 LAB — CBC
HCT: 28.4 % — ABNORMAL LOW (ref 39.0–52.0)
Hemoglobin: 8.9 g/dL — ABNORMAL LOW (ref 13.0–17.0)
MCH: 29.9 pg (ref 26.0–34.0)
MCHC: 31.3 g/dL (ref 30.0–36.0)
MCV: 95.3 fL (ref 80.0–100.0)
Platelets: 287 10*3/uL (ref 150–400)
RBC: 2.98 MIL/uL — ABNORMAL LOW (ref 4.22–5.81)
RDW: 17.5 % — ABNORMAL HIGH (ref 11.5–15.5)
WBC: 13.7 10*3/uL — ABNORMAL HIGH (ref 4.0–10.5)
nRBC: 0 % (ref 0.0–0.2)

## 2021-03-12 LAB — GLUCOSE, CAPILLARY
Glucose-Capillary: 128 mg/dL — ABNORMAL HIGH (ref 70–99)
Glucose-Capillary: 141 mg/dL — ABNORMAL HIGH (ref 70–99)
Glucose-Capillary: 124 mg/dL — ABNORMAL HIGH (ref 70–99)
Glucose-Capillary: 130 mg/dL — ABNORMAL HIGH (ref 70–99)
Glucose-Capillary: 156 mg/dL — ABNORMAL HIGH (ref 70–99)

## 2021-03-12 LAB — APTT: aPTT: 65 seconds — ABNORMAL HIGH (ref 24–36)

## 2021-03-12 LAB — MAGNESIUM: Magnesium: 2.1 mg/dL (ref 1.7–2.4)

## 2021-03-12 MED ORDER — PROMETHAZINE HCL 6.25 MG/5ML PO SYRP
25.0000 mg | ORAL_SOLUTION | Freq: Four times a day (QID) | ORAL | Status: DC | PRN
  Filled 2021-03-12: qty 20

## 2021-03-12 MED ORDER — HEPARIN SODIUM (PORCINE) 5000 UNIT/ML IJ SOLN
INTRAMUSCULAR | Status: AC
  Filled 2021-03-12: qty 2

## 2021-03-12 MED ORDER — ANTICOAGULANT SODIUM CITRATE 4% (200MG/5ML) IV SOLN
5.0000 mL | Freq: Once | Status: AC
  Administered 2021-03-12: 5 mL
  Filled 2021-03-12: qty 5

## 2021-03-12 NOTE — Progress Notes (Signed)
PT Cancellation Note  Patient Details Name: Phillip Young MRN: [id number] DOB: Sep 10, 1985   Cancelled Treatment:    Reason Eval/Treat Not Completed: Patient declined, no reason specified;Other (comment) (Declined PT, as pt on CRRT) 03/12/2021  Ginger Carne., PT Acute Rehabilitation Services [phone]  (pager) [phone]  (office)   Tessie Fass Devean Skoczylas 03/12/2021, 1:44 PM

## 2021-03-12 NOTE — Progress Notes (Signed)
CRRT filterbegan to clot at [facility] made aware along with CCM and decision made to stop CRRT with plans to resume intermittent HD tomorrow or Saturday. Pharmacy notified to place order for citrate for HD Cath d/t patient being HIT positive.

## 2021-03-12 NOTE — Progress Notes (Signed)
RT spoke with patient about wearing a Venturi mask after explaining that wearing a Non- rebreather continuously is not best practice.  Patient stated he would try to use the other mask.  The Venturi mask was setup for 14 L with 55% fiO2.  RN will try to apply once patient gets into the bed.

## 2021-03-12 NOTE — Progress Notes (Signed)
ANTICOAGULATION CONSULT NOTE - Follow Up Consult  Pharmacy Consult for bivalirudin Indication: atrial fibrillation  Labs: Recent Labs    03/10/21 0509 03/11/21 0407 03/11/21 1608 03/12/21 0334  HGB 8.5* 9.0*  --  8.9*  HCT 27.1* 28.8*  --  28.4*  PLT 281 322  --  287  APTT 67* 51*  --  65*  CREATININE 6.41* 3.60* 3.26* 3.09*     Assessment: 35yo male on heparin drip for AFib now in SR on amiodarone. Patient was on a heparin gtt, but developed symptoms of HIT and was changed to bivalirudin.   HIT Ab positive, SRA also positive and he continues on bivalirudin.  Bivalirudin 0.06 mg/kg/hr aptt 65 sec at goal, h/h stable. No bleeding issues noted.  Goal of Therapy:  aPTT 50-85 seconds   Plan:  Bivalirudin at 0.'06mg'$ /kg/h Check aPTT daily  Erin Hearing PharmD., BCPS Clinical Pharmacist 03/12/2021 1:47 PM

## 2021-03-12 NOTE — Progress Notes (Deleted)
PT Cancellation Note  Patient Details Name: Phillip Young MRN: [id number] DOB: 12-22-1985   Cancelled Treatment:    Reason Eval/Treat Not Completed: Patient declined, no reason specified;Other (comment) (Declined PT, as pt on CRRT)   Domingo Dimes 03/12/2021, 11:29 AM

## 2021-03-12 NOTE — Progress Notes (Signed)
NAME:  Phillip Young, MRN:  [id number], DOB:  January 04, 1986, LOS: 66 ADMISSION DATE:  02/11/2021, CONSULTATION DATE:  02/15/21 REFERRING MD:  Harvin Hazel HF  -- [name] , CHIEF COMPLAINT:  decompensated heart failure, renal failure    History of Present Illness:  35 yo M PMH class V obesity, biventricular heart failure, HTN, OSA presented to ED 7/20 with CC SOB, palpitations x 1 week. Associated dry cough, intermittent hypoxia, edema. Admitted to [facility] for management of acute on chronic heart failure, rapid aflutter. Got '80mg'$  IV lasix, amio. 7/21 Adv HF consulted for further assistance. 7/24 patient in the CVICU on a lasix infusion as well as dobutamine without robust response to diuresis (notably patient is refusing foley catheter placement). He was started on norepi 7/24 with persistent soft Bps amd worsening AKI, volume overload.   He improved somewhat with pressors and inotropes and PCCM signed off, however has had a prolonged hospital stay and on 8/16 had worsening volume overload and respiratory distress and was refusing to wear his CPAP so PCCM re-consulted in this setting   Pertinent  Medical History  Biventricular heart failure OSA Class V obesity GERD HTN DM2  Significant Hospital Events: Including procedures, antibiotic start and stop dates in addition to other pertinent events   7/20 admitted to [facility] for Afl RVR, acute on chronic BiV HF, hypoxia. Amio, push dose lasix  7/21 adv HF consult, started dobutamine, lasix gtt  7/22 ECHO with LVEF <20% (worse than prior). Hard to assess RV with technically difficult study  7/24 adding NE. CCM consult. Worse renal function, on lasix gtt but teetering need for CRRT  7/25 CRRT pulling 150/hr 8/16 Tunneled Hd catheter placed by IR.  Developed fever and worsening dyspnea requiring 15L O2, refusing Bipap, PCCM reconsulted  Interim History / Subjective:  No events, remains on CRRT and pressors. Getting discouraged, wants to know when he gets better. Dry  cough ongoing Remains on NRB with breaks for meals  Objective   Blood pressure 93/65, pulse (!) 123, temperature 97.7 F (36.5 C), temperature source Oral, resp. rate (!) 37, height '5\' 11"'$  (1.803 m), weight (!) 195.3 kg, SpO2 (!) 84 %. CVP:  [18 mmHg-22 mmHg] 18 mmHg  FiO2 (%):  [15 %-100 %] 15 %   Intake/Output Summary (Last 24 hours) at 03/12/2021 0801 Last data filed at 03/12/2021 0700 Gross per 24 hour  Intake 3481.9 ml  Output 10222 ml  Net -6740.1 ml    Filed Weights   03/10/21 1042 03/11/21 0500 03/12/21 0500  Weight: (!) 198.4 kg (!) 200.1 kg (!) 195.3 kg   No distress Lungs diminished at bases Ext with 2+ edema Mentating okay but poor insight Tachypneic   Resolved Hospital Problem list    Assessment & Plan:   Acute hypoxemic respiratory failure on Home O2, Pulmonary edema.  OSA/OHS  With baseline poor CPAP compliance Continued pulmonary edema; his last discharge weight was around where he is now P: -pt encouraged to wear CPAP, agrees to intubation if declines -DC nebs, are not helping cough (cough is why he hates the CPAP), will do trial of phenergan cough syrup -CRRT as below   Acute on chronic biventricular decompensated heart failure with pulmonary edema and cardiogenic shock EF <20% P:  -inotropes per primary -levophed titrated to MAP 65 -continues on Midodrine 15 mg TID  -not a candidate for advanced therapies   Atrial flutter w RVR P: -remains on amiodarone  -heparin switched to bivalirudin 2/2 HIT > plans to  change to apixaban once respiratory status stable   Cardiorenal syndrome:  Pt unable to tolerate iHD Tunneled dialysis catheter placed 8/16 P: -Nephrology Following, Continues on CRRT pushing negative pull as tolerated  Fever Improved LIJ temp HD cath removed PICC in place P: -Trend WBC and Fever Curve, Follow Culture Data, continues on Cefepime/Vancomycin   Overall not really sure what destination is here; he is likely to die in  hospital.  Consider palliative care consultation.  Best Practice (right click and "Reselect all SmartList Selections" daily)    Patient critically ill due to shock, respiratory failure Interventions to address this today pressor titration Risk of deterioration without these interventions is high  I personally spent 34 minutes providing critical care not including any separately billable procedures  Erskine Emery MD [facility]  Prefer epic messenger for cross cover needs If after hours, please call E-link

## 2021-03-12 NOTE — Progress Notes (Signed)
Daily Progress Note   Patient Name: Phillip Young       Date: 03/12/2021 DOB: 04-09-1986  Age: 35 y.o. MRN#: [id number] Attending Physician: Larey Dresser, MD Primary Care Physician: [name], MD Admit Date: 02/11/2021  Reason for Consultation/Follow-up: Establishing goals of care  Patient Profile/HPI:  35 y.o. male  with past medical history of diabetes, OSA, CHF, morbid obesity admitted on 02/11/2021 with shortness of breath and palpitations. Workup reveals CHF exacerbation and acute renal failure in the setting of A-fib with RVR. ECHO showed EF <20%. He has been hypotensive - is on dobutamine and norepinephrine. Currently on CRRT. Scheduled for DCCV tomorrow without TE or intubation due to his high risk to not be able to extubate. He was refusing fluid restrictions and midline (but currently has accepted)- and made comment to cardiology NP that he was "tired and not sure he wanted to continue doing this". Palliative medicine consulted for goals of care. Will improved somewhat and was transitioned to iHD, however, he did not tolerate and did not comply with fluid restriction and again decompensated. Currently on CRRT, dobutamine, norepi, plan to try to transition to iHD again and retry DCCV- if fails then he will need EOL care.   Subjective: Will is awake, alert, on NRB. Discussed his care with him. Per RN report he has not been adhering to fluid restriction- he denies this. Encouraged him to take accountability for his care as well.  Tells me he is doing well. I challenged him on this with facts indicating that he is not doing well- on CRRT, on multiple life supporting IV medications, increased oxygen requirements.  He says he has learned his lesson and his goal is to continue  interventions and discharge home. I shared with him I am concerned that even if he adheres to fluid restriction there is concern his heart and his kidneys may not recover.  I asked him if his providers came to him and shared they had provided all interventions possible and were out of options what would his goal be? What would he want his death to look like? Where would he want to be?  He would want to die at home.  He asked me if he's going to die- I was clear in my answer that he is at very high risk of  dying, and even if he lives through this hospitalization, then he is likely to die within the next months or few years.  Code status discussion- he wants to continue full code and life support "as long as there is brain activity". His definition of brain activity is any activity detected, even if he is not awake.   Review of Systems  Constitutional:  Positive for fever and malaise/fatigue.  Respiratory:  Positive for shortness of breath.     Physical Exam Vitals and nursing note reviewed.  Constitutional:      Appearance: He is obese.  Pulmonary:     Comments: Increased effort, increased rate Neurological:     Mental Status: He is alert and oriented to person, place, and time.        Palliative Care Assessment & Plan    Assessment/Recommendations/Plan  Continue current scope- Will is not likely to set any limits in his care at this point Code status- continue full scope- however, if medical treatment is determined to be maximized and no further interventions are offered then will again discuss DNR with him as this would be the only way to meet his goal of dying at home. PMT will follow for outcomes- will watch for attending recommendations as to when treatments have been maximized and de-escalation of care is indicated and then will re-engage in patient discussions   Code Status: Full code  Prognosis:  Unable to determine  Discharge Planning: To Be Determined  Care plan was  discussed with patient's nurse.   Thank you for allowing the Palliative Medicine Team to assist in the care of this patient.  Total time: 56 minutes Prolonged billing: No     Greater than 50%  of this time was spent counseling and coordinating care related to the above assessment and plan.  Mariana Kaufman, AGNP-C Palliative Medicine   Please contact Palliative Medicine Team phone at [phone] for questions and concerns.

## 2021-03-12 NOTE — Progress Notes (Signed)
Patient ID: Phillip Young, male   DOB: 1985/07/29, 35 y.o.   MRN: [id number] [facility] Progress Note   Assessment/ Plan:   1. Acute kidney Injury: Secondary to cardiogenic shock/hemodynamically mediated in the setting of atrial flutter with RVR and underlying severe biventricular congestive heart failure.  Nonoliguric overnight (860 cc urine).  He failed transition to intermittent dialysis earlier this week and was restarted back on CRRT; net -6.5 L overnight.  Per discussion with cardiology, this will be a final attempt to determine the ability to transition to intermittent hemodialysis-if unsuccessful will transition to palliative/hospice care. 2.  Hyponatremia: Secondary to CHF/impaired free water handling with poorly restricted free water intake, continue torsemide and improving with CRRT.  Poor prognostic indicator. 3.  Heparin-induced thrombocytopenia: Transitioned to bivalirudin from heparin after positive HIT panel on 8/11. 4.  Biventricular congestive heart failure: Transitioned from CRRT to intermittent hemodialysis and I will restart torsemide today to augment urine output.  Not a candidate for advanced therapies due to morbid obesity. 5.  Atrial flutter with RVR: He underwent cardioversion earlier but now back in atrial flutter with occasional tachycardia, on amiodarone and bivalirudin.  Subjective:   Reports that he is feeling better and inquires about timeline of transitioning from CRRT to intermittent hemodialysis.   Objective:   BP 93/65   Pulse (!) 123   Temp 98.8 F (37.1 C) (Oral)   Resp (!) 37   Ht '5\' 11"'$  (1.803 m)   Wt (!) 195.3 kg   SpO2 (!) 84%   BMI 60.05 kg/m   Intake/Output Summary (Last 24 hours) at 03/12/2021 0723 Last data filed at 03/12/2021 0700 Gross per 24 hour  Intake 3435.34 ml  Output 10554 ml  Net -7118.66 ml   Weight change: -6.8 kg  Physical Exam: Gen: Morbidly obese man, sitting up in recliner, oxygen via facemask CVS: Pulse  regular rhythm and normal rate, S1 and S2 normal, right IJ TDC connected to CRRT Resp: Coarse/transmitted breath sounds bilaterally with rhonchi Abd: Soft, obese, nontender Ext: 1-2+ anasarca.    Imaging: IR Fluoro Guide CV Line Right  Result Date: 03/10/2021 INDICATION: Acute kidney injury EXAM: Ultrasound and fluoroscopy guided tunneled dialysis catheter placement MEDICATIONS: Ancef 3 gm IV; The antibiotic was administered within an appropriate time interval prior to skin puncture. ANESTHESIA/SEDATION: None FLUOROSCOPY TIME:  Fluoroscopy Time: 1 minutes of 54 seconds (74 mGy). COMPLICATIONS: None immediate. PROCEDURE: Informed written consent was obtained from the patient after a thorough discussion of the procedural risks, benefits and alternatives. All questions were addressed. Maximal Sterile Barrier Technique was utilized including caps, mask, sterile gowns, sterile gloves, sterile drape, hand hygiene and skin antiseptic. A timeout was performed prior to the initiation of the procedure. The right internal jugular vein was evaluated with ultrasound and shown to be patent. A permanent ultrasound image was obtained and placed in the patient's medical record. Using sterile gel and a sterile probe cover, the right internal jugular vein was entered with a 21 ga needle during real time ultrasound guidance. 0.018 inch guidewire placed and 21 ga needle exchanged for transitional dilator set. Utilizing fluoroscopy, 0.035 inch guidewire advanced through the needle without difficulty. Seriel dilation was performed and peel-away sheath was placed. Attention then turned to the right anterior upper chest. Following local lidocaine administration, the hemodialysis catheter was tunneled from the chest wall to the venotomy site. The catheter was inserted through the peel-away sheath. The tip of the catheter was positioned within the right atrium using fluoroscopic  guidance. All lumens of the catheter aspirated and  flushed well. The dialysis lumens were locked with sodium citrate. The catheter was secured to the skin with suture. The insertion site was covered with a Biopatch and sterile dressing. IMPRESSION: 23 cm right IJ tunneled hemodialysis catheter is ready for use. Electronically Signed   By: Miachel Roux M.D.   On: 03/10/2021 15:39   IR US Guide Vasc Access Right  Result Date: 03/10/2021 INDICATION: Acute kidney injury EXAM: Ultrasound and fluoroscopy guided tunneled dialysis catheter placement MEDICATIONS: Ancef 3 gm IV; The antibiotic was administered within an appropriate time interval prior to skin puncture. ANESTHESIA/SEDATION: None FLUOROSCOPY TIME:  Fluoroscopy Time: 1 minutes of 54 seconds (74 mGy). COMPLICATIONS: None immediate. PROCEDURE: Informed written consent was obtained from the patient after a thorough discussion of the procedural risks, benefits and alternatives. All questions were addressed. Maximal Sterile Barrier Technique was utilized including caps, mask, sterile gowns, sterile gloves, sterile drape, hand hygiene and skin antiseptic. A timeout was performed prior to the initiation of the procedure. The right internal jugular vein was evaluated with ultrasound and shown to be patent. A permanent ultrasound image was obtained and placed in the patient's medical record. Using sterile gel and a sterile probe cover, the right internal jugular vein was entered with a 21 ga needle during real time ultrasound guidance. 0.018 inch guidewire placed and 21 ga needle exchanged for transitional dilator set. Utilizing fluoroscopy, 0.035 inch guidewire advanced through the needle without difficulty. Seriel dilation was performed and peel-away sheath was placed. Attention then turned to the right anterior upper chest. Following local lidocaine administration, the hemodialysis catheter was tunneled from the chest wall to the venotomy site. The catheter was inserted through the peel-away sheath. The tip of the  catheter was positioned within the right atrium using fluoroscopic guidance. All lumens of the catheter aspirated and flushed well. The dialysis lumens were locked with sodium citrate. The catheter was secured to the skin with suture. The insertion site was covered with a Biopatch and sterile dressing. IMPRESSION: 23 cm right IJ tunneled hemodialysis catheter is ready for use. Electronically Signed   By: Miachel Roux M.D.   On: 03/10/2021 15:39   DG CHEST PORT 1 VIEW  Result Date: 03/11/2021 CLINICAL DATA:  PICC line EXAM: PORTABLE CHEST 1 VIEW COMPARISON:  Chest x-ray dated March 10, 2021 FINDINGS: Unchanged cardiomegaly. Diffuse bilateral heterogeneous opacities, unchanged compared to prior. No definite pleural effusion or pneumothorax. Interval placement of right arm PICC with tip within the right atrium. Unchanged position of bilateral central venous lines. IMPRESSION: Interval placement of right arm PICC with tip within the right atrium. Electronically Signed   By: Yetta Glassman M.D.   On: 03/11/2021 14:20   DG CHEST PORT 1 VIEW  Result Date: 03/10/2021 CLINICAL DATA:  Shortness of breath. EXAM: PORTABLE CHEST 1 VIEW COMPARISON:  03/09/2021 FINDINGS: New right IJ dialysis catheter in good position without complicating features. The left IJ catheter is stable. Stable cardiac enlargement and prominent mediastinal and hilar contours. Stable diffuse interstitial and airspace process in the lungs, likely pulmonary edema. No obvious/large pleural effusions and no pneumothorax. IMPRESSION: Central lines in good position without complicating features. Stable cardiac enlargement and diffuse interstitial and airspace process in lungs. Electronically Signed   By: Marijo Sanes M.D.   On: 03/10/2021 17:22   [place] EKG SITE RITE  Result Date: 03/11/2021 If Site Rite image not attached, placement could not be confirmed due to current cardiac rhythm.  Labs: BMET Recent Labs  Lab 03/07/21 0400 03/08/21 0510  03/09/21 0325 03/09/21 2211 03/10/21 0509 03/11/21 0407 03/11/21 1608 03/12/21 0334  NA 119* 121* 120* 129* 129* 131* 129* 130*  K 4.2 4.4 4.8 4.5 4.6 4.4 4.3 4.3  CL 86* 88* 87*  --  96* 96* 96* 93*  CO2 18* 20* 19*  --  '23 26 25 23  '$ GLUCOSE 110* 117* 97  --  128* 111* 213* 195*  BUN 61* 69* 77*  --  60* 33* 32* 27*  CREATININE 8.56* 8.77* 8.97*  --  6.41* 3.60* 3.26* 3.09*  CALCIUM 8.4* 8.5* 8.7*  --  8.2* 8.3* 8.1* 8.8*  PHOS 6.6* 6.1* 7.2*  --  4.3 3.6 3.5 2.4*   CBC Recent Labs  Lab 03/09/21 0325 03/09/21 2211 03/10/21 0509 03/11/21 0407 03/12/21 0334  WBC 13.5*  --  13.5* 14.9* 13.7*  HGB 9.4* 9.9* 8.5* 9.0* 8.9*  HCT 28.2* 29.0* 27.1* 28.8* 28.4*  MCV 92.2  --  93.4 94.7 95.3  PLT 180  --  281 322 287    Medications:     (feeding supplement) PROSource Plus  30 mL Oral TID BM   budesonide (PULMICORT) nebulizer solution  0.25 mg Nebulization BID   Chlorhexidine Gluconate Cloth  6 each Topical Daily   Chlorhexidine Gluconate Cloth  6 each Topical Q0600   insulin aspart  0-5 Units Subcutaneous QHS   insulin aspart  0-9 Units Subcutaneous TID WC   mouth rinse  15 mL Mouth Rinse BID   midodrine  15 mg Oral TID WC   multivitamin  1 tablet Oral QHS   polyethylene glycol  17 g Oral Daily   senna-docusate  2 tablet Oral BID   sodium chloride flush  10-40 mL Intracatheter Q12H   sodium chloride flush  3 mL Intravenous Q12H   torsemide  80 mg Oral BID   Elmarie Shiley, MD 03/12/2021, 7:23 AM

## 2021-03-12 NOTE — Progress Notes (Signed)
Patient ID: Phillip Young, male   DOB: 06-11-1986, 35 y.o.   MRN: [id number]      Advanced Heart Failure Rounding Note  PCP-Cardiologist: Dr. [name]  Subjective:    Remains on DBA 3 and NE 12. CO-OX 55%.  Weight down 11 lbs with CVVH, pulling net UF 200-250 cc/hr.  CVP 16.      Afebrile, on vancomycin/cefepime.  CVL removed and PICC placed.  Cultures negative.   He is back in atrial fibrillation rate 100s-110s on amiodarone gtt 30 and bivalirudin.     Objective:   Weight Range: (!) 195.3 kg Body mass index is 60.05 kg/m.   Vital Signs:   Temp:  [97.7 F (36.5 C)-98.9 F (37.2 C)] 97.7 F (36.5 C) (08/18 0740) Pulse Rate:  [25-151] 109 (08/18 0800) Resp:  [16-41] 29 (08/18 0800) BP: (56-169)/(35-144) 119/77 (08/18 0800) SpO2:  [79 %-100 %] 94 % (08/18 0800) FiO2 (%):  [15 %] 15 % (08/17 1954) Weight:  [195.3 kg] 195.3 kg (08/18 0500) Last BM Date: 03/11/21  Weight change: Filed Weights   03/10/21 1042 03/11/21 0500 03/12/21 0500  Weight: (!) 198.4 kg (!) 200.1 kg (!) 195.3 kg    Intake/Output:   Intake/Output Summary (Last 24 hours) at 03/12/2021 0844 Last data filed at 03/12/2021 0800 Gross per 24 hour  Intake 3541.63 ml  Output 10476 ml  Net -6934.37 ml      Physical Exam  CVP 16.  General: NAD Neck: Thick, JVP 16, no thyromegaly or thyroid nodule.  Lungs: Clear to auscultation bilaterally with normal respiratory effort. CV: Nonpalpable PMI.  Heart regular S1/S2, no S3/S4, no murmur.  1+ edema to knees.  Abdomen: Soft, nontender, no hepatosplenomegaly, no distention.  Skin: Intact without lesions or rashes.  Neurologic: Alert and oriented x 3.  Psych: Normal affect. Extremities: No clubbing or cyanosis.  HEENT: Normal.    Telemetry  Atrial fibrillation 100s-110s. Personally reviewed.   Labs    CBC Recent Labs    03/11/21 0407 03/12/21 0334  WBC 14.9* 13.7*  HGB 9.0* 8.9*  HCT 28.8* 28.4*  MCV 94.7 95.3  PLT 322 [id number]   Basic Metabolic  Panel Recent Labs    03/11/21 0407 03/11/21 1608 03/12/21 0334  NA 131* 129* 130*  K 4.4 4.3 4.3  CL 96* 96* 93*  CO2 '26 25 23  '$ GLUCOSE 111* 213* 195*  BUN 33* 32* 27*  CREATININE 3.60* 3.26* 3.09*  CALCIUM 8.3* 8.1* 8.8*  MG 1.9  --  2.1  PHOS 3.6 3.5 2.4*   Liver Function Tests Recent Labs    03/11/21 1608 03/12/21 0334  ALBUMIN 2.4* 2.5*    No results for input(s): LIPASE, AMYLASE in the last 72 hours. Cardiac Enzymes No results for input(s): CKTOTAL, CKMB, CKMBINDEX, TROPONINI in the last 72 hours.  BNP: BNP (last 3 results) Recent Labs    10/10/20 1336 11/07/20 0932 02/11/21 1500  BNP 126.9* 106.0* 194.3*    ProBNP (last 3 results) No results for input(s): PROBNP in the last 8760 hours.   D-Dimer No results for input(s): DDIMER in the last 72 hours.  Hemoglobin A1C No results for input(s): HGBA1C in the last 72 hours.  Fasting Lipid Panel No results for input(s): CHOL, HDL, LDLCALC, TRIG, CHOLHDL, LDLDIRECT in the last 72 hours. Thyroid Function Tests No results for input(s): TSH, T4TOTAL, T3FREE, THYROIDAB in the last 72 hours.  Invalid input(s): FREET3   Other results:   Imaging    DG CHEST PORT 1  VIEW  Result Date: 03/11/2021 CLINICAL DATA:  PICC line EXAM: PORTABLE CHEST 1 VIEW COMPARISON:  Chest x-ray dated March 10, 2021 FINDINGS: Unchanged cardiomegaly. Diffuse bilateral heterogeneous opacities, unchanged compared to prior. No definite pleural effusion or pneumothorax. Interval placement of right arm PICC with tip within the right atrium. Unchanged position of bilateral central venous lines. IMPRESSION: Interval placement of right arm PICC with tip within the right atrium. Electronically Signed   By: Yetta Glassman M.D.   On: 03/11/2021 14:20     Medications:     Scheduled Medications:  (feeding supplement) PROSource Plus  30 mL Oral TID BM   Chlorhexidine Gluconate Cloth  6 each Topical Daily   Chlorhexidine Gluconate Cloth  6  each Topical Q0600   insulin aspart  0-5 Units Subcutaneous QHS   insulin aspart  0-9 Units Subcutaneous TID WC   mouth rinse  15 mL Mouth Rinse BID   midodrine  15 mg Oral TID WC   multivitamin  1 tablet Oral QHS   polyethylene glycol  17 g Oral Daily   senna-docusate  2 tablet Oral BID   sodium chloride flush  10-40 mL Intracatheter Q12H   sodium chloride flush  3 mL Intravenous Q12H   torsemide  80 mg Oral BID    Infusions:   prismasol BGK 4/2.5 500 mL/hr at 03/12/21 0200    prismasol BGK 4/2.5 200 mL/hr at 03/12/21 0341   amiodarone 30 mg/hr (03/12/21 0800)   bivalirudin (ANGIOMAX) infusion 0.5 mg/mL (Non-ACS indications) 0.06 mg/kg/hr (03/12/21 0800)   ceFEPime (MAXIPIME) IV Stopped (03/11/21 2245)   DOBUTamine 3 mcg/kg/min (03/12/21 0800)   norepinephrine (LEVOPHED) Adult infusion 12 mcg/min (03/12/21 0800)   prismasol BGK 4/2.5 1,500 mL/hr at 03/12/21 0324   vancomycin Stopped (03/11/21 2139)    PRN Medications: acetaminophen **OR** acetaminophen, alteplase, camphor-menthol, diphenhydrAMINE, guaiFENesin-dextromethorphan, lidocaine (PF), lidocaine-prilocaine, LORazepam, menthol-cetylpyridinium, ondansetron (ZOFRAN) IV, oxyCODONE, pentafluoroprop-tetrafluoroeth, promethazine, sodium chloride, sodium chloride, sodium chloride flush, sodium chloride flush, sorbitol  Assessment/Plan   1. Acute on Chronic Biventricular Heart Failure -> cardiogenic shock:  Known NICM since 2019, previously followed by [facility].  Echo 8/21 EF < 20%, moderate LV dilation, severely decreased RV function, severe biatrial enlargement, mild MR. No history of ETOH/drugs.  No FH of cardiomyopathy (mother with Bernard).  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  He is too large for cardiac MRI. RHC/LHC on 03/04/20 with no CAD low CI at 2.1. Evaluated by EP but not a candidate for ICD given BMI.  Echo this admission with EF < 20%, RV poorly Dev eloped cardiogenic  shock -> AKI in setting of AFL with RVR. Dobutamine started at 2.5 and increased to 7.5.  NE added to support CVVHD. He was transitioned to [facility] but unable to tolerate this with rising CVP and respiratory distress, inability to comply with fluid restriction.  Restarted CVVH, CVP coming down again at 16, weight down 11 lbs.  He remains on dobutamine 3 + NE 12 with co-ox 55%.  - Continue CVVH, aiming for net negative UF 200-250 cc/hr today again.  - We will try aggressive CVVH 1 more time, if he fails transition to iHD again, we are out of options. Not candidate for advanced therapies. This has been explained to him several times. If fails to tolerated iHD off inotopes, he will need hospice.  - With AKI/hypotension, stopped Entresto, dapagliflozin, spironolactone, digoxin.  2. Aflutter/fibrillation w/ RVR:  Noted to be in Afib during a prior  admission in 3/22 in the setting of CAP and a/c CHF, but converted to NSR on amiodarone. Discharged home w/ eliquis. Was in 2:1 AFL this admit in setting of a/c CHF w/ marked volume overload, + recent gastroenteritis w/ diarrhea. Has OSA but not using CPAP. TSH WNL. Had DCCV on 8/5. He is now back in atrial fibrillation rate 100s-110s.  - Continue amiodarone gtt.  - Heparin switched to bivalirudin due to HIT. Will switch to apixaban when more stable off NRBM.  - Plan for DCCV again when fluid off.  3. AKI on CKD IV: Likely ATN/cardiorenal.CVVHD stopped on 8/9 after catheter clotted.  iHD started on 8/11 with 2L of volume removed. Trialysis cath switched 8/12. Tolerated iHD on 8/13 and had dialysis catheter placed 8/16.  On 8/16, CVVH restarted with respiratory distress.  - As above, 1 more trial of CVVH to get fluid off again. If cannot tolerate iHD off pressors will be hospice situation 4. Acute Hypoxic Respiratory Failure: Suspect underlying OHS/OSA exacerbated by CHF this admission. Still with significant O2 requirement => NRBM this morning.  - Continue oxygen  -  Refusing Bipap at night - Volume removal with CVVH.  5. Type 2 DM: Farxiga held with AKI.  - continue SSI 6. OSA: Has not been on Bipap at home.  7. Super Morbid Obesity: Body mass index is 60.05 kg/m. 8. UTI: Suspect by UA, sent urine culture.  - Ceftriaxone IV course completed.  9. Hyponatremia: Improved with CVVH. - He has been counseled on free water restriction numerous times 10. ID: With fever, started empirically on vancomycin/cefepime.  Cultures NGTD.  CVL removed and PICC placed.  11. 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 Platelets recovered.  SRA+.  - Continue bivalirudin, eventually will switch to Eliquis   Extensive discussion yet again about need for po fluid compliance and lack of good options if he fails iHD again.  He must try to maintain fluid restriction.  If he wants to drink whatever he wants, will need move to hospice care.   CRITICAL CARE Performed by: [name]  Total critical care time: 40 minutes  Critical care time was exclusive of separately billable procedures and treating other patients.  Critical care was necessary to treat or prevent imminent or life-threatening deterioration.  Critical care was time spent personally by me on the following activities: development of treatment plan with patient and/or surrogate as well as nursing, discussions with consultants, evaluation of patient's response to treatment, examination of patient, obtaining history from patient or surrogate, ordering and performing treatments and interventions, ordering and review of laboratory studies, ordering and review of radiographic studies, pulse oximetry and re-evaluation of patient's condition.  [name] 03/12/2021 8:44 AM

## 2021-03-12 NOTE — Progress Notes (Signed)
Patient requested to put CPAP at the bedside with O2 bleed in 10 L and he would apply the mask when he was ready.

## 2021-03-13 DIAGNOSIS — I5023 Acute on chronic systolic (congestive) heart failure: Secondary | ICD-10-CM | POA: Diagnosis not present

## 2021-03-13 LAB — RENAL FUNCTION PANEL
Albumin: 2.5 g/dL — ABNORMAL LOW (ref 3.5–5.0)
Albumin: 2.5 g/dL — ABNORMAL LOW (ref 3.5–5.0)
Anion gap: 13 (ref 5–15)
Anion gap: 11 (ref 5–15)
BUN: 32 mg/dL — ABNORMAL HIGH (ref 6–20)
BUN: 29 mg/dL — ABNORMAL HIGH (ref 6–20)
CO2: 20 mmol/L — ABNORMAL LOW (ref 22–32)
CO2: 22 mmol/L (ref 22–32)
Calcium: 8.6 mg/dL — ABNORMAL LOW (ref 8.9–10.3)
Calcium: 8.3 mg/dL — ABNORMAL LOW (ref 8.9–10.3)
Chloride: 95 mmol/L — ABNORMAL LOW (ref 98–111)
Chloride: 98 mmol/L (ref 98–111)
Creatinine, Ser: 3.44 mg/dL — ABNORMAL HIGH (ref 0.61–1.24)
Creatinine, Ser: 2.82 mg/dL — ABNORMAL HIGH (ref 0.61–1.24)
GFR, Estimated: 23 mL/min — ABNORMAL LOW (ref >60)
GFR, Estimated: 29 mL/min — ABNORMAL LOW (ref >60)
Glucose, Bld: 193 mg/dL — ABNORMAL HIGH (ref 70–99)
Glucose, Bld: 177 mg/dL — ABNORMAL HIGH (ref 70–99)
Phosphorus: 2.9 mg/dL (ref 2.5–4.6)
Phosphorus: 3.2 mg/dL (ref 2.5–4.6)
Potassium: 4.3 mmol/L (ref 3.5–5.1)
Potassium: 4.2 mmol/L (ref 3.5–5.1)
Sodium: 128 mmol/L — ABNORMAL LOW (ref 135–145)
Sodium: 131 mmol/L — ABNORMAL LOW (ref 135–145)

## 2021-03-13 LAB — CBC
HCT: 27.2 % — ABNORMAL LOW (ref 39.0–52.0)
Hemoglobin: 8.4 g/dL — ABNORMAL LOW (ref 13.0–17.0)
MCH: 29.7 pg (ref 26.0–34.0)
MCHC: 30.9 g/dL (ref 30.0–36.0)
MCV: 96.1 fL (ref 80.0–100.0)
Platelets: 295 10*3/uL (ref 150–400)
RBC: 2.83 MIL/uL — ABNORMAL LOW (ref 4.22–5.81)
RDW: 17.4 % — ABNORMAL HIGH (ref 11.5–15.5)
WBC: 13.6 10*3/uL — ABNORMAL HIGH (ref 4.0–10.5)
nRBC: 0.1 % (ref 0.0–0.2)

## 2021-03-13 LAB — COOXEMETRY PANEL
Carboxyhemoglobin: 1.1 % (ref 0.5–1.5)
Methemoglobin: 0.8 % (ref 0.0–1.5)
O2 Saturation: 53.6 %
Total hemoglobin: 9.7 g/dL — ABNORMAL LOW (ref 12.0–16.0)

## 2021-03-13 LAB — GLUCOSE, CAPILLARY
Glucose-Capillary: 129 mg/dL — ABNORMAL HIGH (ref 70–99)
Glucose-Capillary: 108 mg/dL — ABNORMAL HIGH (ref 70–99)
Glucose-Capillary: 118 mg/dL — ABNORMAL HIGH (ref 70–99)
Glucose-Capillary: 162 mg/dL — ABNORMAL HIGH (ref 70–99)

## 2021-03-13 LAB — APTT: aPTT: 70 seconds — ABNORMAL HIGH (ref 24–36)

## 2021-03-13 LAB — MAGNESIUM: Magnesium: 2 mg/dL (ref 1.7–2.4)

## 2021-03-13 MED ORDER — SODIUM CHLORIDE 0.9 % IV SOLN
1.0000 g | INTRAVENOUS | Status: DC
  Filled 2021-03-13: qty 1

## 2021-03-13 MED ORDER — VANCOMYCIN HCL 2000 MG/400ML IV SOLN
2000.0000 mg | INTRAVENOUS | Status: AC
  Administered 2021-03-13 – 2021-03-14 (×2): 2000 mg via INTRAVENOUS
  Filled 2021-03-13 (×2): qty 400

## 2021-03-13 MED ORDER — VANCOMYCIN VARIABLE DOSE PER UNSTABLE RENAL FUNCTION (PHARMACIST DOSING): Status: DC

## 2021-03-13 MED ORDER — SODIUM CHLORIDE 0.9 % IV SOLN
2.0000 g | Freq: Two times a day (BID) | INTRAVENOUS | Status: AC
  Administered 2021-03-13 – 2021-03-14 (×4): 2 g via INTRAVENOUS
  Filled 2021-03-13 (×4): qty 2

## 2021-03-13 NOTE — Progress Notes (Signed)
RT at bedside to place pt on CPAP, RN in the room as well.  Pt stated he was not ready, and he would place himself on when ready.  10L 02 bleed in.

## 2021-03-13 NOTE — Progress Notes (Signed)
NAME:  Phillip Young, MRN:  [id number], DOB:  [birth year], LOS: 28 ADMISSION DATE:  02/11/2021, CONSULTATION DATE:  02/15/21 REFERRING MD:  Harvin Hazel HF  -- [name] , CHIEF COMPLAINT:  decompensated heart failure, renal failure    History of Present Illness:  35 yo M PMH class V obesity, biventricular heart failure, HTN, OSA presented to ED 7/20 with CC SOB, palpitations x 1 week. Associated dry cough, intermittent hypoxia, edema. Admitted to [facility] for management of acute on chronic heart failure, rapid aflutter. Got '80mg'$  IV lasix, amio. 7/21 Adv HF consulted for further assistance. 7/24 patient in the CVICU on a lasix infusion as well as dobutamine without robust response to diuresis (notably patient is refusing foley catheter placement). He was started on norepi 7/24 with persistent soft Bps amd worsening AKI, volume overload.   He improved somewhat with pressors and inotropes and PCCM signed off, however has had a prolonged hospital stay and on 8/16 had worsening volume overload and respiratory distress and was refusing to wear his CPAP so PCCM re-consulted in this setting   Pertinent  Medical History  Biventricular heart failure OSA Class V obesity GERD HTN DM2  Significant Hospital Events: Including procedures, antibiotic start and stop dates in addition to other pertinent events   7/20 admitted to [facility] for Afl RVR, acute on chronic BiV HF, hypoxia. Amio, push dose lasix  7/21 adv HF consult, started dobutamine, lasix gtt  7/22 ECHO with LVEF <20% (worse than prior). Hard to assess RV with technically difficult study  7/24 adding NE. CCM consult. Worse renal function, on lasix gtt but teetering need for CRRT  7/25 CRRT pulling 150/hr 8/16 Tunneled Hd catheter placed by IR.  Developed fever and worsening dyspnea requiring 15L O2, refusing Bipap, PCCM reconsulted  Interim History / Subjective:  Remains on Levophed at 3 mcg/min, Dobutamine at 3 mcg/kg/mhr, Bivalirudin at 0.06 mcg/kg/hr, amio at 30  mg/hr Midodrine 15 mg TID Co ox 54% Net negative 58.9 L since admission, but net + 3300 last 24 hours CVVH restarting  8/19 for the weekend with hopes to transition to IHD Monday. 8/22 Remains on NRB , with breaks for meals States he Refused BiPAP overnight  1200 cc fluid restriction, and has already had 800 cc's since midnight.   Objective   Blood pressure 116/88, pulse (!) 113, temperature 98.1 F (36.7 C), temperature source Oral, resp. rate 20, height '5\' 11"'$  (1.803 m), weight (!) 196.1 kg, SpO2 98 %. CVP:  [14 mmHg-21 mmHg] 15 mmHg      Intake/Output Summary (Last 24 hours) at 03/13/2021 0816 Last data filed at 03/13/2021 0700 Gross per 24 hour  Intake 3306.84 ml  Output 3130 ml  Net 176.84 ml   Filed Weights   03/11/21 0500 03/12/21 0500 03/13/21 0500  Weight: (!) 200.1 kg (!) 195.3 kg (!) 196.1 kg   Physical Exam  No distress, on NRB Tachy with irr rhythm + JVD, No LAD Lungs Bilateral chest excursion , diminished at bases, no accessory muscle use while at rest. RR 20 Ext with 1+ edema bilaterally, otherwise no obvious deformities Awake and alert, following commands, MAE x 4, A&O x 3  Labs  Na 128, K 4.3, Cl 95, CO2 20, BUN 32, Creatinine 3.44( 2.92), Mag 2.0 WBC 13.6, HGB 8.4, platelets 295 T Max 99.3  Resolved Hospital Problem list    Assessment & Plan:   Acute hypoxemic respiratory failure on Home O2, Pulmonary edema.  OSA/OHS  With baseline poor CPAP compliance  Continued pulmonary edema; his last discharge weight was around where he is now P: -pt encouraged to wear CPAP, agrees to intubation if declines -DC nebs, are not helping cough (cough is why he hates the CPAP), will do trial of phenergan cough syrup -CRRT as below  - Educated on CPAP/BiPAP and how Untreated OSA worsens Heart Failure which is why he is here  Acute on chronic biventricular decompensated heart failure with pulmonary edema and cardiogenic shock EF <20% Co-ox 54% P:  -inotropes per  primary -levophed titrated to MAP 65 -continues on Midodrine 15 mg TID  -not a candidate for advanced therapies   Atrial flutter w RVR P: -remains on amiodarone  -heparin switched to bivalirudin 2/2 HIT > plans to change to apixaban once respiratory status stable  - trend CBC  Cardiorenal syndrome:  Pt unable to tolerate iHD Tunneled dialysis catheter placed 8/16 P: -Nephrology Following, Continues on CRRT pushing negative pull as tolerated - Vacation day 8/18, restarting 8/19 for the weekend, will try iHD 8/22  Fever Improved LIJ temp HD cath removed PICC in place P: -Trend WBC and Fever Curve, Follow Culture Data, continues on Cefepime/Vancomycin  - Re culture as is clinically indicated  Discussed that 2 of the factors in his situation he can actually control are his fluid intake and wearing his BiPAP/ CPAP. Asked him to take some personal accountability to improve his current situation. Agree with Dr. Tamala Julian, that until he is willing to be compliant with his care , his overall prognosis is poor. Consider palliative care consultation if he is not willing to improve compliance.  Best Practice (right click and "Reselect all SmartList Selections" daily)    Patient critically ill due to shock, respiratory failure Interventions to address this today pressor titration Risk of deterioration without these interventions is high  I spent 32 minutes dedicated to the care of this patient on the date of this encounter to include pre-visit review of records, face-to-face time with the patient discussing conditions above, post visit ordering of testing, clinical documentation with the electronic health record, making appropriate referrals as documented, and communicating necessary information to the patient's healthcare team.   Magdalen Spatz, MSN, AGACNP-BC [place] for personal pager PCCM on call pager [phone]  03/13/2021

## 2021-03-13 NOTE — Progress Notes (Signed)
Spoke with Dr Clayton Bibles, cards fellow, regarding pt's fluid restrictions. Notes that chart shows multiple, detailed discussions about fluid restrictions and pt's difficulty complying. OK for pt to have ice chips, will continue to attempt to keep reasonable with intake. Pt updated, appreciative.

## 2021-03-13 NOTE — Progress Notes (Addendum)
[facility] NEPHROLOGY PROGRESS NOTE  Assessment/ Plan:  # Acute kidney Injury: Secondary to cardiogenic shock/hemodynamically mediated in the setting of atrial flutter with RVR and underlying severe biventricular congestive heart failure.  He failed to transition to intermittent hemodialysis before and was getting CRRT to manage volume.  The CRRT held overnight because of issue with the catheter.  It will be very challenging for him to tolerate intermittent hemodialysis because of biventricular heart failure and ongoing requirement of inotropes and IV pressors.  His weight is going up associated with elevated CVP therefore plan to resume CRRT today at least through the weekend to optimize volume.  Discussed with the patient and the cardiology team in ICU. We will plan to do intermittent hemodialysis probably early next week after optimization of volume.  If he cannot tolerate intermittent hemodialysis then plan to transition him to palliative/hospice care.  # Hyponatremia: Secondary to CHF/impaired free water handling with poorly restricted free water intake, continue torsemide and improving with CRRT.  Poor prognostic indicator.  #Hypotension/shock: With heart failure.  Currently on dobutamine, Levophed and high-dose midodrine.  Monitor blood pressure.  # Heparin-induced thrombocytopenia: Transitioned to bivalirudin from heparin after positive HIT panel on 8/11.  # Biventricular congestive heart failure:  Not a candidate for advanced therapies due to morbid obesity.  Volume management with CRRT.  Heart failure team is following.  #  Atrial flutter with RVR: He underwent cardioversion earlier but now back in atrial flutter with occasional tachycardia, on amiodarone and bivalirudin.  Subjective: Seen and examined in bedside.  The CRRT held overnight because of pressure with the catheter.  He is not restricting fluid intake and leading to weight gain and elevated CVP.  He however  denies chest pain or shortness of breath.  Discussed with ICU nurse and heart failure team.  Urine output only 350 cc. Objective Vital signs in last 24 hours: Vitals:   03/13/21 0615 03/13/21 0630 03/13/21 0645 03/13/21 0700  BP: (!) 79/64 97/63 (!) 90/54 116/88  Pulse: (!) 103 (!) 102 (!) 110 (!) 113  Resp: (!) 29 (!) 29 (!) 25 20  Temp: 98.1 F (36.7 C)     TempSrc: Oral     SpO2: 94% 100% 100% 98%  Weight:      Height:       Weight change: 0.8 kg  Intake/Output Summary (Last 24 hours) at 03/13/2021 0832 Last data filed at 03/13/2021 0700 Gross per 24 hour  Intake 3306.84 ml  Output 3130 ml  Net 176.84 ml       Labs: Basic Metabolic Panel: Recent Labs  Lab 03/12/21 0334 03/12/21 1920 03/13/21 0350  NA 130* 129* 128*  K 4.3 4.2 4.3  CL 93* 96* 95*  CO2 23 22 20*  GLUCOSE 195* 217* 193*  BUN 27* 28* 32*  CREATININE 3.09* 2.92* 3.44*  CALCIUM 8.8* 8.3* 8.6*  PHOS 2.4* 2.0* 2.9   Liver Function Tests: Recent Labs  Lab 03/12/21 0334 03/12/21 1920 03/13/21 0350  ALBUMIN 2.5* 2.5* 2.5*   No results for input(s): LIPASE, AMYLASE in the last 168 hours. No results for input(s): AMMONIA in the last 168 hours. CBC: Recent Labs  Lab 03/09/21 0325 03/09/21 2211 03/10/21 0509 03/11/21 0407 03/12/21 0334 03/13/21 0350  WBC 13.5*  --  13.5* 14.9* 13.7* 13.6*  HGB 9.4*   < > 8.5* 9.0* 8.9* 8.4*  HCT 28.2*   < > 27.1* 28.8* 28.4* 27.2*  MCV 92.2  --  93.4 94.7 95.3  96.1  PLT 180  --  281 322 287 295   < > = values in this interval not displayed.   Cardiac Enzymes: No results for input(s): CKTOTAL, CKMB, CKMBINDEX, TROPONINI in the last 168 hours. CBG: Recent Labs  Lab 03/12/21 0737 03/12/21 1126 03/12/21 1548 03/12/21 2127 03/13/21 0606  GLUCAP 141* 124* 130* 156* 129*    Iron Studies: No results for input(s): IRON, TIBC, TRANSFERRIN, FERRITIN in the last 72 hours. Studies/Results: DG CHEST PORT 1 VIEW  Result Date: 03/11/2021 CLINICAL DATA:  PICC  line EXAM: PORTABLE CHEST 1 VIEW COMPARISON:  Chest x-ray dated March 10, 2021 FINDINGS: Unchanged cardiomegaly. Diffuse bilateral heterogeneous opacities, unchanged compared to prior. No definite pleural effusion or pneumothorax. Interval placement of right arm PICC with tip within the right atrium. Unchanged position of bilateral central venous lines. IMPRESSION: Interval placement of right arm PICC with tip within the right atrium. Electronically Signed   By: Yetta Glassman M.D.   On: 03/11/2021 14:20    Medications: Infusions:   prismasol BGK 4/2.5 Stopped (03/12/21 1900)    prismasol BGK 4/2.5 Stopped (03/12/21 1900)   amiodarone 30 mg/hr (03/13/21 0700)   bivalirudin (ANGIOMAX) infusion 0.5 mg/mL (Non-ACS indications) 0.06 mg/kg/hr (03/13/21 0700)   ceFEPime (MAXIPIME) IV     DOBUTamine 3 mcg/kg/min (03/13/21 0700)   norepinephrine (LEVOPHED) Adult infusion 5 mcg/min (03/13/21 0700)   prismasol BGK 4/2.5 Stopped (03/12/21 1900)   vancomycin      Scheduled Medications:  (feeding supplement) PROSource Plus  30 mL Oral TID BM   Chlorhexidine Gluconate Cloth  6 each Topical Daily   Chlorhexidine Gluconate Cloth  6 each Topical Q0600   insulin aspart  0-5 Units Subcutaneous QHS   insulin aspart  0-9 Units Subcutaneous TID WC   mouth rinse  15 mL Mouth Rinse BID   midodrine  15 mg Oral TID WC   multivitamin  1 tablet Oral QHS   polyethylene glycol  17 g Oral Daily   senna-docusate  2 tablet Oral BID   sodium chloride flush  10-40 mL Intracatheter Q12H   sodium chloride flush  3 mL Intravenous Q12H   torsemide  80 mg Oral BID    have reviewed scheduled and prn medications.  Physical Exam: General: Morbidly obese male, not in distress.  On oxygen via facemask Heart:RRR, s1s2 nl Lungs: Distant and coarse breath sound Abdomen:soft, obese, nontender Extremities: Significant edema consistent with anasarca Dialysis Access: Right IJ temporary HD catheter.  Lelon Ikard Reesa Chew  Betzaira Mentel 03/13/2021,8:32 AM  LOS: 30 days

## 2021-03-13 NOTE — Discharge Summary (Addendum)
Advanced Heart Failure Team  Discharge Summary   Patient ID: Phillip Young MRN: [id number], DOB/AGE: 36-Dec-1987 36 y.o. Admit date: 02/11/2021 D/C date:     03/26/2021   Primary Discharge Diagnoses:  Acute on Chronic Biventricular Heart Failure>>Cardiogenic Shock  Atrial Fibrillation/Flutter w/ RVR AKI on Stage IV CKD>>ESRD  Acute Hypoxic Respiratory Failure HIT UTI  Type 2 DM  OSA Supper Morbid Obesity    Hospital Course:   35 y.o. with history of cardiomyopathy and obesity.  Phillip Young has been known to have a cardiomyopathy (biventricular failure) since back in 2019, echo in 2019 with EF 20% [facility]). (biventricular failure) since back in 2019, echo in 2019 with EF 20% [facility]).    Admitted to [facility] in [id number] with A/C Systolic HF. Diuresed with IV lasix and transitioned to torsemide. Placed on milrinone with low output and had RHC/LHC. Cath showed no CAD and volume overload.  Blood gas revealed a CO2 level elevated to 78. CPAP was initiated.   Phillip Young was unable to tolerate Coreg due to wheezing and diarrhea.   06/17/2020 Echo EF 20-25%, RV poorly visualized.   Admitted 10/08/20 with viral CAP and A/C systolic heart failure. Treated with antibiotics and diuresed with IV lasix. Once diuresed transitioned to 80 mg torsemide daily. Had new onset A fib RVR thought to be triggered by CAP.  Placed on dig + amio + elqiuis for A fib RVR. Phillip Young was switched to Bipap at discharge but Phillip Young could not afford the Co-pay. Discharge weight 437 pounds.  Admitted 02/11/21 w/ a/c CHF w/ marked fluid overload, in setting of recurrent AFL w/ RVR. Initial poor response to IV lasix, concerning for low output. PICC line was placed for co-ox and CVP monitoring. Initial Co-ox 58%. CVP > 20. Started on milrinone, lasix gtt and IV amio. Echo showed LVEF <20, RV poorly visualized. Developed worsening shock and worsening oliguric renal failure. Milrinone switched to DBA. Later required addition of NE for hypotension and further inotropic support. Required CRRT for volume removal. After volume removal,  underwent DCCV to NSR but later reverted back to Afib w/ RVR. Not a candidate for ablation due to size. Remained HD/ pressor dependent and unable to wean off CRRT w/ persistent fluid overload and inability to adhere to strict fluid restriction.   Phillip Young eventually transitioned to [facility] with a gradual decrease in inotropic support, but after a few sessions, Phillip Young decompensated with the recurrence of atrial fibrillation and hypoxic respiratory failure requiring a non-rebreather secondary to volume overload.  CRRT was resumed.  Phillip Young was successfully cardioverted back into NSR and again transitioned to iHD.  On 8/30, inotropes were discontinued with plans for a trial of iHD without any hemodynamic support.  Phillip Young was able to tolerate dialysis on 03/25/2021 with stable blood pressures on no  pressors.  Phillip Young was also weaned down to 3L of oxygen.    Hospital course also notable for HIT.  Heparin switched to bivalirudin. Platelets recovered.  SRA+. Also w/ UTI treated w/ abx.  [facility] team assisted with procuring home oxygen, Trilogy and outpatient dialysis arrangements.      Discharge Weight Range: 412 pounds (admission weight was ~475 lbs) Discharge Vitals: Blood pressure (!) 91/52, pulse 86, temperature 98 F (36.7 C), temperature source Oral, resp. rate (!) 37, height '5\' 11"'$  (1.803 m), weight (!) 187 kg, SpO2 96 %.  Labs: Lab Results  Component Value Date   WBC 9.5 03/26/2021   HGB 9.8 (L) 03/26/2021   HCT 30.9 (L) 03/26/2021   MCV 96.6 03/26/2021   PLT 330 03/26/2021  Recent Labs  Lab 03/26/21 0423  NA 130*  K 4.8  CL 94*  CO2 25  BUN 36*  CREATININE 8.41*  CALCIUM 9.6  GLUCOSE 118*   Lab Results  Component Value Date   TRIG 137 07/29/2020   BNP (last 3 results) Recent Labs    10/10/20 1336 11/07/20 0932 02/11/21 1500  BNP 126.9* 106.0* 194.3*    ProBNP (last 3 results) No results for input(s): PROBNP in the last 8760 hours.   Diagnostic Studies/Procedures   No results  found.  Discharge Medications   Allergies as of 03/26/2021       Reactions   Coreg [carvedilol] Shortness Of Breath, Diarrhea   Wheezing    Heparin Other (See Comments)   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, SRA positive        Medication List     STOP taking these medications    amoxicillin 500 MG capsule Commonly known as: AMOXIL   dapagliflozin propanediol 10 MG Tabs tablet Commonly known as: FARXIGA   digoxin 0.125 MG tablet Commonly known as: LANOXIN   Entresto 97-103 MG Generic drug: sacubitril-valsartan   Potassium Chloride ER 20 MEQ Tbcr   spironolactone 25 MG tablet Commonly known as: ALDACTONE   Torsemide 40 MG Tabs       TAKE these medications    amiodarone 200 MG tablet Commonly known as: PACERONE Take 200 mg twice a day x 10 days then 200 mg daily What changed:  how much to take how to take this when to take this additional instructions   apixaban 5 MG Tabs tablet Commonly known as: Eliquis Take 1 tablet (5 mg total) by mouth 2 (two) times daily.   lanthanum 500 MG chewable tablet Commonly known as: FOSRENOL Chew 1 tablet (500 mg total) by mouth 3 (three) times daily with meals.   midodrine 10 MG tablet Commonly known as: PROAMATINE Take 2 tablets (20 mg total) by mouth 3 (three) times daily with meals. Do NOT hold for dialysis. TAKE DURING DAYLIGHT HOURS ONLY               Durable Medical Equipment  (From admission, onward)           Start     Ordered   03/25/21 1135  For home use only DME oxygen  Once       Comments: Will need OCD evaluation  Question Answer Comment  Length of Need Lifetime   Mode or (Route) Nasal cannula   Liters per Minute 3   Frequency Continuous (stationary and portable oxygen unit needed)   Oxygen conserving device Yes   Oxygen delivery system Gas      03/25/21 1134            Disposition   The patient will be discharged in stable condition to home. Discharge Instructions     (HEART  FAILURE PATIENTS) Call MD:  Anytime you have any of the following symptoms: 1) 3 pound weight gain in 24 hours or 5 pounds in 1 week 2) shortness of breath, with or without a dry hacking cough 3) swelling in the hands, feet or stomach 4) if you have to sleep on extra pillows at night in order to breathe.   Complete by: As directed    Diet - low sodium heart healthy   Complete by: As directed    Increase activity slowly   Complete by: As directed        Follow-up [place],  [facility] Follow up.   Why: will deliver portable tank to room to use for discharge and deliver concentrator for home. [employer] will arrange evaluation for a portable oxygen concentator to use when you work. [employer] will work with your insurance plan to get Trilogy/NIV approved for use at night Contact information: [place] [address] [phone]                   Duration of Discharge Encounter: Greater than 35 minutes   SignedDarcella Gasman  03/26/2021, 8:53 AM  Patient seen with NP, agree with the above note.   Phillip Young tolerated HD yesterday and is off pressors.  See my note from today.  Phillip Young can go home with close followup in CHF clinic.  Phillip Young has outpatient HD seat.   [name] 03/26/2021 9:28 AM

## 2021-03-13 NOTE — Progress Notes (Signed)
ANTICOAGULATION CONSULT NOTE - Follow Up Consult  Pharmacy Consult for bivalirudin Indication: atrial fibrillation  Labs: Recent Labs    03/11/21 0407 03/11/21 1608 03/12/21 0334 03/12/21 1920 03/13/21 0350  HGB 9.0*  --  8.9*  --  8.4*  HCT 28.8*  --  28.4*  --  27.2*  PLT 322  --  287  --  295  APTT 51*  --  65*  --  70*  CREATININE 3.60*   < > 3.09* 2.92* 3.44*   < > = values in this interval not displayed.     Assessment: 35yo male on heparin drip for AFib now in SR on amiodarone. Patient was on a heparin gtt, but developed symptoms of HIT and was changed to bivalirudin.   HIT Ab positive, SRA also positive and he continues on bivalirudin.   aPTT is therapeutic at 70, on bivalirudin 0.06 mg/kg/hr. No s/sx of bleeding or infusion issues - possibly hemorrhoid bleeding but will monitor.  Goal of Therapy:  aPTT 50-85 seconds   Plan:  Bivalirudin at 0.'06mg'$ /kg/h Check aPTT daily  Antonietta Jewel, PharmD, BCCCP Clinical Pharmacist  Phone: [phone] 03/13/2021 7:20 AM  Please check AMION for all [place] phone numbers After 10:00 PM, call [place] [phone]

## 2021-03-13 NOTE — Progress Notes (Addendum)
Patient ID: Phillip Young, male   DOB: [birth year], 35 y.o.   MRN: [id number]      Advanced Heart Failure Rounding Note  PCP-Cardiologist: Dr. [name]  Subjective:    Remains on DBA 3 and NE 5. CO-OX 54%. SBPs 90s. Cuff MAPs 70s. Remains in Afib 110s   CRRT stopped yesterday. Had 3L in fluid removal but intake also high. Net positive for the day, I/Os + 17 cc. Wt up 2 lb.   CVP 15  Slept well last night w/ BiPAP. Reports feeling better. No resting dyspnea or CP.   Objective:   Weight Range: (!) 196.1 kg Body mass index is 60.3 kg/m.   Vital Signs:   Temp:  [97.1 F (36.2 C)-99.3 F (37.4 C)] 98.1 F (36.7 C) (08/19 0615) Pulse Rate:  [58-134] 110 (08/19 0645) Resp:  [18-37] 25 (08/19 0645) BP: (78-130)/(38-94) 90/54 (08/19 0645) SpO2:  [88 %-100 %] 100 % (08/19 0645) Weight:  [196.1 kg] 196.1 kg (08/19 0500) Last BM Date: 03/12/21  Weight change: Filed Weights   03/11/21 0500 03/12/21 0500 03/13/21 0500  Weight: (!) 200.1 kg (!) 195.3 kg (!) 196.1 kg    Intake/Output:   Intake/Output Summary (Last 24 hours) at 03/13/2021 0714 Last data filed at 03/13/2021 0600 Gross per 24 hour  Intake 3313.12 ml  Output 3384 ml  Net -70.88 ml      Physical Exam   CVP 15 General:  obese young AAM. No respiratory difficulty HEENT: normal Neck: supple. JVD elevated to jaw. Carotids 2+ bilat; no bruits. No lymphadenopathy or thyromegaly appreciated. Cor: PMI nondisplaced. Irregularly irregular rhythm and tachy rate. No rubs, gallops or murmurs. Lungs: clear Abdomen: obese, soft, nontender, nondistended. No hepatosplenomegaly. No bruits or masses. Good bowel sounds. Extremities: no cyanosis, clubbing, rash, edema Neuro: alert & oriented x 3, cranial nerves grossly intact. moves all 4 extremities w/o difficulty. Affect pleasant.  Telemetry  Atrial fibrillation 100s-110s. Personally reviewed.   Labs    CBC Recent Labs    03/12/21 0334 03/13/21 0350  WBC 13.7* 13.6*  HGB  8.9* 8.4*  HCT 28.4* 27.2*  MCV 95.3 96.1  PLT 287 [id number]   Basic Metabolic Panel Recent Labs    03/12/21 0334 03/12/21 1920 03/13/21 0350  NA 130* 129* 128*  K 4.3 4.2 4.3  CL 93* 96* 95*  CO2 23 22 20*  GLUCOSE 195* 217* 193*  BUN 27* 28* 32*  CREATININE 3.09* 2.92* 3.44*  CALCIUM 8.8* 8.3* 8.6*  MG 2.1  --  2.0  PHOS 2.4* 2.0* 2.9   Liver Function Tests Recent Labs    03/12/21 1920 03/13/21 0350  ALBUMIN 2.5* 2.5*    No results for input(s): LIPASE, AMYLASE in the last 72 hours. Cardiac Enzymes No results for input(s): CKTOTAL, CKMB, CKMBINDEX, TROPONINI in the last 72 hours.  BNP: BNP (last 3 results) Recent Labs    10/10/20 1336 11/07/20 0932 02/11/21 1500  BNP 126.9* 106.0* 194.3*    ProBNP (last 3 results) No results for input(s): PROBNP in the last 8760 hours.   D-Dimer No results for input(s): DDIMER in the last 72 hours.  Hemoglobin A1C No results for input(s): HGBA1C in the last 72 hours.  Fasting Lipid Panel No results for input(s): CHOL, HDL, LDLCALC, TRIG, CHOLHDL, LDLDIRECT in the last 72 hours. Thyroid Function Tests No results for input(s): TSH, T4TOTAL, T3FREE, THYROIDAB in the last 72 hours.  Invalid input(s): FREET3   Other results:   Imaging    No  results found.   Medications:     Scheduled Medications:  (feeding supplement) PROSource Plus  30 mL Oral TID BM   Chlorhexidine Gluconate Cloth  6 each Topical Daily   Chlorhexidine Gluconate Cloth  6 each Topical Q0600   insulin aspart  0-5 Units Subcutaneous QHS   insulin aspart  0-9 Units Subcutaneous TID WC   mouth rinse  15 mL Mouth Rinse BID   midodrine  15 mg Oral TID WC   multivitamin  1 tablet Oral QHS   polyethylene glycol  17 g Oral Daily   senna-docusate  2 tablet Oral BID   sodium chloride flush  10-40 mL Intracatheter Q12H   sodium chloride flush  3 mL Intravenous Q12H   torsemide  80 mg Oral BID    Infusions:   prismasol BGK 4/2.5 Stopped (03/12/21  1900)    prismasol BGK 4/2.5 Stopped (03/12/21 1900)   amiodarone 30 mg/hr (03/13/21 0600)   bivalirudin (ANGIOMAX) infusion 0.5 mg/mL (Non-ACS indications) 0.06 mg/kg/hr (03/13/21 0600)   ceFEPime (MAXIPIME) IV Stopped (03/12/21 2257)   DOBUTamine 3 mcg/kg/min (03/13/21 0600)   norepinephrine (LEVOPHED) Adult infusion 6 mcg/min (03/13/21 0600)   prismasol BGK 4/2.5 Stopped (03/12/21 1900)   vancomycin Stopped (03/12/21 2222)    PRN Medications: acetaminophen **OR** acetaminophen, alteplase, camphor-menthol, diphenhydrAMINE, guaiFENesin-dextromethorphan, lidocaine (PF), lidocaine-prilocaine, LORazepam, menthol-cetylpyridinium, ondansetron (ZOFRAN) IV, oxyCODONE, pentafluoroprop-tetrafluoroeth, promethazine, sodium chloride, sodium chloride, sodium chloride flush, sodium chloride flush, sorbitol  Assessment/Plan   1. Acute on Chronic Biventricular Heart Failure -> cardiogenic shock:  Known NICM since 2019, previously followed by [facility].  Echo 8/21 EF < 20%, moderate LV dilation, severely decreased RV function, severe biatrial enlargement, mild MR. No history of ETOH/drugs.  No FH of cardiomyopathy (mother with [place]).  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  He is too large for cardiac MRI. RHC/LHC on 03/04/20 with no CAD low CI at 2.1. Evaluated by EP but not a candidate for ICD given BMI.  Echo this admission with EF < 20%, RV poorly Dev eloped cardiogenic shock -> AKI in setting of AFL with RVR. Dobutamine started at 2.5 and increased to 7.5.  NE added to support CVVHD. He was transitioned to [facility] but unable to tolerate this with rising CVP and respiratory distress, inability to comply with fluid restriction.  Restarted CVVH for additional fluid removal.  He remains on dobutamine 3 + NE 5 with co-ox 54%. CRRT stopped 8/18 w/ plans to retrial iHD. Today CVP 15. Wt up 2 lb.  - Plan attempt at [facility] again. Nephrology to manage   - If he fails  transition to [facility] again, we are out of options. Not candidate for advanced therapies. This has been explained to him several times. If fails to tolerated iHD off inotopes, he will need hospice.  - With AKI/hypotension, stopped Entresto, dapagliflozin, spironolactone, digoxin.  - long discussion again regarding importance of fluid restriction  2. Aflutter/fibrillation w/ RVR:  Noted to be in Afib during a prior admission in 3/22 in the setting of CAP and a/c CHF, but converted to NSR on amiodarone. Discharged home w/ eliquis. Was in 2:1 AFL this admit in setting of a/c CHF w/ marked volume overload, + recent gastroenteritis w/ diarrhea. Has OSA but not using CPAP. TSH WNL. Had DCCV on 8/5. He is now back in atrial fibrillation rate 100s-110s.  - Continue amiodarone gtt.  - Heparin switched to bivalirudin due to HIT. Will switch to apixaban when more stable  off NRBM.  - Plan for DCCV again when fluid off.  3. AKI on CKD IV: Likely ATN/cardiorenal.CVVHD stopped on 8/9 after catheter clotted.  iHD started on 8/11 with 2L of volume removed. Trialysis cath switched 8/12. Tolerated iHD on 8/13 and had dialysis catheter placed 8/16.  On 8/16, CVVH restarted with respiratory distress.  - As above, 1 more trial of iHD. If cannot tolerate iHD off pressors will be hospice situation 4. Acute Hypoxic Respiratory Failure: Suspect underlying OHS/OSA exacerbated by CHF this admission. Still with significant O2 requirement => NRBM this morning.  - Continue oxygen  - Encouraged Bipap at night - Volume removal with HD 5. Type 2 DM: Farxiga held with AKI.  - continue SSI 6. OSA: Has not been on Bipap at home.  7. Super Morbid Obesity: Body mass index is 60.3 kg/m. 8. UTI: Suspect by UA, sent urine culture.  - Ceftriaxone IV course completed.  9. Hyponatremia: Improved with CVVH but back down again, 128 today  - He has been counseled on free water restriction numerous times 10. ID: With fever, started empirically on  vancomycin/cefepime.  Cultures NGTD.  CVL removed and PICC placed.  11. 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 Platelets recovered.  SRA+.  - Continue bivalirudin, eventually will switch to Eliquis   Lyda Jester, PA-C  03/13/2021 7:14 AM  Patient seen with PA, agree with the above note.   CVVH stopped last night with clotting of filter and not restarted.  Still with CVP 15-16.  Still not obeying fluid restriction.  Co-ox 54% on NE 5 and dobutamine 3, I/Os even as CVVH has not been running overnight.   General: NAD Neck: JVP 16 cm, no thyromegaly or thyroid nodule.  Lungs: Clear to auscultation bilaterally with normal respiratory effort. CV: Nonpalpable PMI.  Heart regular S1/S2, no S3/S4, no murmur.  Trace ankle edema.  Abdomen: Soft, nontender, no hepatosplenomegaly, no distention.  Skin: Intact without lesions or rashes.  Neurologic: Alert and oriented x 3.  Psych: Normal affect. Extremities: No clubbing or cyanosis.  HEENT: Normal.   It is going to be very hard for Mr Dupriest to succeed at [facility], his only chance will be if we start with a relatively low CVP. Would like to see CVP around 10 before trying iHD.  Discussed with Dr. Carolin Sicks, will restart CVVH aiming for 200-250 cc net negative, probably over the weekend, then when CVP optimized will try him again on iHD.  If he fails this time with iHD, we will have no other options and will need to continue hospice discussions.  Continue midodrine 15 mg tid, will eventually have to wean off dobutamine and NE, will wait to push this until CVVH completed.   He remains on vancomycin/cefepime, is afebrile with cultures negative to date.   Remains in atrial fibrillation, would plan for repeat DCCV when volume status is better managed (possibly next week).    CRITICAL CARE Performed by: [name]  Total critical care time: 40 minutes  Critical care time was exclusive of  separately billable procedures and treating other patients.  Critical care was necessary to treat or prevent imminent or life-threatening deterioration.  Critical care was time spent personally by me on the following activities: development of treatment plan with patient and/or surrogate as well as nursing, discussions with consultants, evaluation of patient's response to treatment, examination of patient, obtaining history from patient or surrogate, ordering and performing treatments and interventions, ordering and  review of laboratory studies, ordering and review of radiographic studies, pulse oximetry and re-evaluation of patient's condition.  [name] 03/13/2021 8:02 AM

## 2021-03-13 NOTE — Progress Notes (Signed)
PT Cancellation Note  Patient Details Name: Phillip Young MRN: [id number] DOB: [birth year]   Cancelled Treatment:    Reason Eval/Treat Not Completed: Patient declined, no reason specified.  Not feeling up to it and pointed over to CRRT like that also meant "NO". 03/13/2021  Ginger Carne., PT Acute Rehabilitation Services [phone]  (pager) [phone]  (office)   Tessie Fass [name] 03/13/2021, 12:18 PM

## 2021-03-13 NOTE — Progress Notes (Signed)
Pt remains on 1200 ml fluid restrictions which started over at 0000. Have had continued discussion/educational talks with pt about importance of fluid restrictions (especially since CRRT has stopped). CVP went from 14 at 2000 to 17 at 0000. Sats stable, no change in respiratory status.  Pt has already used up approx 500 ml. Requesting ice chips and unwilling to compromise/follow MD instructions. States 'I have my rights'. Attempting to help pt make a plan on how he can make intake last the remaining 22 hrs.   CN in room to discuss options and hear pt's concerns.   Pt requests to speak to MD, cards fellow paged.

## 2021-03-13 NOTE — Plan of Care (Signed)
  Problem: Education: Goal: Knowledge of General Education information will improve Description: Including pain rating scale, medication(s)/side effects and non-pharmacologic comfort measures Outcome: Not Progressing   Problem: Health Behavior/Discharge Planning: Goal: Ability to manage health-related needs will improve Outcome: Not Progressing   Problem: Clinical Measurements: Goal: Ability to maintain clinical measurements within normal limits will improve Outcome: Not Progressing   Problem: Pain Managment: Goal: General experience of comfort will improve Outcome: Not Progressing

## 2021-03-13 NOTE — Progress Notes (Signed)
Pharmacy Antibiotic Note  Phillip Young is a 35 y.o. male admitted on 02/11/2021 with HF. He now had increased WBC 13, fever 101.6, increased vasopressor needs and increased SOB. BCx drawn and start empiric ABX and CRRT,  Pharmacy had been consulted for Vancomycin and cefepime dosing.  Now on day#4 of antibiotics. CRRT stopped 8/18, but now plan to restart until CVP lower then transition to HD. WBC 13, afebrile.  Plan: Vancomycin 2g q24 hr Cefepime 2gm IV q12h  Monitor renal fx, cx results, clinic pic   Height: '5\' 11"'$  (180.3 cm) Weight: (!) 196.1 kg (432 lb 5.2 oz) IBW/kg (Calculated) : 75.3  Temp (24hrs), Avg:98.1 F (36.7 C), Min:97.1 F (36.2 C), Max:99.3 F (37.4 C)  Recent Labs  Lab 03/09/21 0325 03/09/21 2110 03/10/21 0509 03/11/21 0407 03/11/21 1608 03/12/21 0334 03/12/21 1920 03/13/21 0350  WBC 13.5*  --  13.5* 14.9*  --  13.7*  --  13.6*  CREATININE 8.97*  --  6.41* 3.60* 3.26* 3.09* 2.92* 3.44*  LATICACIDVEN  --  1.2  --   --   --   --   --   --      Estimated Creatinine Clearance: 51.9 mL/min (A) (by C-G formula based on SCr of 3.44 mg/dL (H)).    Allergies  Allergen Reactions   Coreg [Carvedilol] Shortness Of Breath and Diarrhea    Wheezing    Heparin Other (See Comments)    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, SRA positive    Antimicrobials this admission: Vancomycin 8/16>> Cefepime 8/16>>  Dose adjustments this admission: N/A  Microbiology results: 8/16 bld: ngtd 8/16 resp:sent 8/12 bld: ng 8/13 bld: ng  Antonietta Jewel, PharmD, BCCCP Clinical Pharmacist  Phone: [phone] 03/13/2021 7:34 AM  Please check AMION for all [place] phone numbers After 10:00 PM, call [place] [phone]

## 2021-03-14 LAB — RENAL FUNCTION PANEL
Albumin: 2.6 g/dL — ABNORMAL LOW (ref 3.5–5.0)
Albumin: 2.8 g/dL — ABNORMAL LOW (ref 3.5–5.0)
Anion gap: 11 (ref 5–15)
Anion gap: 8 (ref 5–15)
BUN: 23 mg/dL — ABNORMAL HIGH (ref 6–20)
BUN: 22 mg/dL — ABNORMAL HIGH (ref 6–20)
CO2: 23 mmol/L (ref 22–32)
CO2: 25 mmol/L (ref 22–32)
Calcium: 8.7 mg/dL — ABNORMAL LOW (ref 8.9–10.3)
Calcium: 8.6 mg/dL — ABNORMAL LOW (ref 8.9–10.3)
Chloride: 93 mmol/L — ABNORMAL LOW (ref 98–111)
Chloride: 96 mmol/L — ABNORMAL LOW (ref 98–111)
Creatinine, Ser: 2.86 mg/dL — ABNORMAL HIGH (ref 0.61–1.24)
Creatinine, Ser: 3.01 mg/dL — ABNORMAL HIGH (ref 0.61–1.24)
GFR, Estimated: 28 mL/min — ABNORMAL LOW (ref >60)
GFR, Estimated: 27 mL/min — ABNORMAL LOW (ref >60)
Glucose, Bld: 206 mg/dL — ABNORMAL HIGH (ref 70–99)
Glucose, Bld: 163 mg/dL — ABNORMAL HIGH (ref 70–99)
Phosphorus: 2.4 mg/dL — ABNORMAL LOW (ref 2.5–4.6)
Phosphorus: 2.8 mg/dL (ref 2.5–4.6)
Potassium: 4.3 mmol/L (ref 3.5–5.1)
Potassium: 4.7 mmol/L (ref 3.5–5.1)
Sodium: 127 mmol/L — ABNORMAL LOW (ref 135–145)
Sodium: 129 mmol/L — ABNORMAL LOW (ref 135–145)

## 2021-03-14 LAB — MAGNESIUM: Magnesium: 2.1 mg/dL (ref 1.7–2.4)

## 2021-03-14 LAB — CBC
HCT: 27.8 % — ABNORMAL LOW (ref 39.0–52.0)
Hemoglobin: 8.4 g/dL — ABNORMAL LOW (ref 13.0–17.0)
MCH: 29.2 pg (ref 26.0–34.0)
MCHC: 30.2 g/dL (ref 30.0–36.0)
MCV: 96.5 fL (ref 80.0–100.0)
Platelets: 276 10*3/uL (ref 150–400)
RBC: 2.88 MIL/uL — ABNORMAL LOW (ref 4.22–5.81)
RDW: 17.2 % — ABNORMAL HIGH (ref 11.5–15.5)
WBC: 10.7 10*3/uL — ABNORMAL HIGH (ref 4.0–10.5)
nRBC: 0.2 % (ref 0.0–0.2)

## 2021-03-14 LAB — COOXEMETRY PANEL
Carboxyhemoglobin: 1.1 % (ref 0.5–1.5)
Methemoglobin: 0.6 % (ref 0.0–1.5)
O2 Saturation: 52.8 %
Total hemoglobin: 9 g/dL — ABNORMAL LOW (ref 12.0–16.0)

## 2021-03-14 LAB — GLUCOSE, CAPILLARY
Glucose-Capillary: 124 mg/dL — ABNORMAL HIGH (ref 70–99)
Glucose-Capillary: 121 mg/dL — ABNORMAL HIGH (ref 70–99)
Glucose-Capillary: 158 mg/dL — ABNORMAL HIGH (ref 70–99)

## 2021-03-14 LAB — APTT: aPTT: 59 seconds — ABNORMAL HIGH (ref 24–36)

## 2021-03-14 MED ORDER — GERHARDT'S BUTT CREAM
TOPICAL_CREAM | Freq: Every day | CUTANEOUS | Status: DC | PRN
  Administered 2021-03-16: 1 via TOPICAL
  Filled 2021-03-14 (×2): qty 1
  Filled 2021-03-14 (×2): qty 2

## 2021-03-14 MED ORDER — SODIUM CHLORIDE 0.9 % IV SOLN
INTRAVENOUS | Status: DC | PRN
  Administered 2021-03-14: 250 mL via INTRAVENOUS

## 2021-03-14 NOTE — Progress Notes (Signed)
RT at bedside, pt stated he doesn't want to wear CPAP tonight. Pt on NRB, no distress noted.

## 2021-03-14 NOTE — Progress Notes (Signed)
[facility] NEPHROLOGY PROGRESS NOTE  Assessment/ Plan:  # Acute kidney Injury: Secondary to cardiogenic shock/hemodynamically mediated in the setting of atrial flutter with RVR and underlying severe biventricular congestive heart failure.  He failed to transition to intermittent hemodialysis before and was getting CRRT to manage volume.  The CRRT held overnight because of issue with the catheter.  It will be very challenging for him to tolerate intermittent hemodialysis because of biventricular heart failure and ongoing requirement of inotropes and IV pressors.  Resumed CRRT on 8/19 and has been tolerating well with ultrafiltration.  CVP still elevated therefore plan to continue CRRT through the weekend and attempt intermittent IHD early next week.  If he cannot tolerate intermittent hemodialysis then plan to transition him to palliative/hospice care.  # Hyponatremia: Secondary to CHF/impaired free water handling with poorly restricted free water intake, continue CRRT and I will discontinue torsemide as he is not making much urine.  Poor prognostic indicator.  #Hypotension/shock: With heart failure.  Currently on dobutamine, Levophed and high-dose midodrine.  Monitor blood pressure.  # Heparin-induced thrombocytopenia: Transitioned to bivalirudin from heparin after positive HIT panel on 8/11.  # Biventricular congestive heart failure:  Not a candidate for advanced therapies due to morbid obesity.  Volume management with CRRT.  Heart failure team is following.  #  Atrial flutter with RVR: He underwent cardioversion earlier but now back in atrial flutter with occasional tachycardia, on amiodarone and bivalirudin.  Subjective: Seen and examined in bedside.  Tolerating CRRT well without any issues.  Able to do ultrafiltration around 250 cc an hour night.  No new event.  He denies chest pain, shortness of breath.  Discussed with nurse. Objective Vital signs in last 24 hours: Vitals:    03/14/21 0615 03/14/21 0630 03/14/21 0700 03/14/21 0800  BP:  (!) 113/43 112/71 94/65  Pulse: (!) 108 100 77 (!) 122  Resp: (!) 29 (!) 23 (!) 33 13  Temp:      TempSrc:      SpO2: 100% 100% 98% 100%  Weight:      Height:       Weight change: -3.8 kg  Intake/Output Summary (Last 24 hours) at 03/14/2021 0810 Last data filed at 03/14/2021 0806 Gross per 24 hour  Intake 2742.1 ml  Output 8415 ml  Net -5672.9 ml        Labs: Basic Metabolic Panel: Recent Labs  Lab 03/13/21 0350 03/13/21 1710 03/14/21 0500  NA 128* 131* 127*  K 4.3 4.2 4.3  CL 95* 98 93*  CO2 20* 22 23  GLUCOSE 193* 177* 206*  BUN 32* 29* 23*  CREATININE 3.44* 2.82* 2.86*  CALCIUM 8.6* 8.3* 8.7*  PHOS 2.9 3.2 2.4*    Liver Function Tests: Recent Labs  Lab 03/13/21 0350 03/13/21 1710 03/14/21 0500  ALBUMIN 2.5* 2.5* 2.6*    No results for input(s): LIPASE, AMYLASE in the last 168 hours. No results for input(s): AMMONIA in the last 168 hours. CBC: Recent Labs  Lab 03/10/21 0509 03/11/21 0407 03/12/21 0334 03/13/21 0350 03/14/21 0437  WBC 13.5* 14.9* 13.7* 13.6* 10.7*  HGB 8.5* 9.0* 8.9* 8.4* 8.4*  HCT 27.1* 28.8* 28.4* 27.2* 27.8*  MCV 93.4 94.7 95.3 96.1 96.5  PLT 281 322 287 295 276    Cardiac Enzymes: No results for input(s): CKTOTAL, CKMB, CKMBINDEX, TROPONINI in the last 168 hours. CBG: Recent Labs  Lab 03/13/21 0606 03/13/21 1112 03/13/21 1557 03/13/21 2124 03/14/21 0615  GLUCAP 129* 108* 118* 162*  124*     Iron Studies: No results for input(s): IRON, TIBC, TRANSFERRIN, FERRITIN in the last 72 hours. Studies/Results: No results found.  Medications: Infusions:   prismasol BGK 4/2.5 500 mL/hr at 03/14/21 0244    prismasol BGK 4/2.5 200 mL/hr at 03/14/21 0244   amiodarone 30 mg/hr (03/14/21 0800)   bivalirudin (ANGIOMAX) infusion 0.5 mg/mL (Non-ACS indications) 0.06 mg/kg/hr (03/14/21 0800)   ceFEPime (MAXIPIME) IV Stopped (03/13/21 2322)   DOBUTamine 3  mcg/kg/min (03/14/21 0800)   norepinephrine (LEVOPHED) Adult infusion 4 mcg/min (03/14/21 0800)   prismasol BGK 4/2.5 1,500 mL/hr at 03/14/21 0802   vancomycin 2,000 mg (03/13/21 2100)    Scheduled Medications:  (feeding supplement) PROSource Plus  30 mL Oral TID BM   Chlorhexidine Gluconate Cloth  6 each Topical Daily   Chlorhexidine Gluconate Cloth  6 each Topical Q0600   insulin aspart  0-5 Units Subcutaneous QHS   insulin aspart  0-9 Units Subcutaneous TID WC   mouth rinse  15 mL Mouth Rinse BID   midodrine  15 mg Oral TID WC   multivitamin  1 tablet Oral QHS   polyethylene glycol  17 g Oral Daily   senna-docusate  2 tablet Oral BID   sodium chloride flush  10-40 mL Intracatheter Q12H   sodium chloride flush  3 mL Intravenous Q12H   torsemide  80 mg Oral BID    have reviewed scheduled and prn medications.  Physical Exam: General: Morbidly obese male, not in distress.  Sitting on bed with oxygen via facemask Heart:RRR, s1s2 nl Lungs: Distant and coarse breath sound Abdomen:soft, obese, nontender Extremities: Significant edema consistent with anasarca Dialysis Access: Right IJ temporary HD catheter.  Clevie Prout Prasad Madoc Holquin 03/14/2021,8:10 AM  LOS: 31 days

## 2021-03-14 NOTE — Progress Notes (Signed)
ANTICOAGULATION CONSULT NOTE - Follow Up Consult  Pharmacy Consult for bivalirudin Indication: atrial fibrillation  Labs: Recent Labs    03/12/21 0334 03/12/21 1920 03/13/21 0350 03/13/21 1710 03/14/21 0437 03/14/21 0500  HGB 8.9*  --  8.4*  --  8.4*  --   HCT 28.4*  --  27.2*  --  27.8*  --   PLT 287  --  295  --  276  --   APTT 65*  --  70*  --  59*  --   CREATININE 3.09*   < > 3.44* 2.82*  --  2.86*   < > = values in this interval not displayed.     Assessment: 35yo male on heparin drip for AFib now in SR on amiodarone. Patient was on a heparin gtt, but developed symptoms of HIT and was changed to bivalirudin.   HIT Ab positive, SRA also positive and he continues on bivalirudin.   aPTT is therapeutic at 59, on bivalirudin 0.06 mg/kg/hr. No s/sx of bleeding or infusion issues.  Goal of Therapy:  aPTT 50-85 seconds   Plan:  Bivalirudin at 0.'06mg'$ /kg/h Check aPTT daily  Erin Hearing PharmD., BCPS Clinical Pharmacist 03/14/2021 7:24 AM

## 2021-03-14 NOTE — Progress Notes (Signed)
Patient ID: Phillip Young, male   DOB: 1985-11-02, 35 y.o.   MRN: [id number]      Advanced Heart Failure Rounding Note  PCP-Cardiologist: Dr. [name]  Subjective:    Remains on DBA 3 and NE 4. CO-OX 53%. SBPs 110s. Remains in Afib 110s.   He is on CVVH, pulling net UF 250+.  Weight down 9 lbs.  CVP still 17.  He remains on vancomycin/cefepime and is afebrile.     Objective:   Weight Range: (!) 192.3 kg Body mass index is 59.13 kg/m.   Vital Signs:   Temp:  [97.4 F (36.3 C)-97.8 F (36.6 C)] 97.8 F (36.6 C) (08/20 0330) Pulse Rate:  [45-132] 77 (08/20 0700) Resp:  [7-39] 33 (08/20 0700) BP: (49-145)/(29-126) 112/71 (08/20 0700) SpO2:  [93 %-100 %] 98 % (08/20 0700) Weight:  [192.3 kg] 192.3 kg (08/20 0500) Last BM Date: 03/13/21  Weight change: Filed Weights   03/12/21 0500 03/13/21 0500 03/14/21 0500  Weight: (!) 195.3 kg (!) 196.1 kg (!) 192.3 kg    Intake/Output:   Intake/Output Summary (Last 24 hours) at 03/14/2021 0735 Last data filed at 03/14/2021 0700 Gross per 24 hour  Intake 3033.08 ml  Output 8031 ml  Net -4997.92 ml      Physical Exam   CVP 17 General: NAD Neck: JVP 16 cm, no thyromegaly or thyroid nodule.  Lungs: Clear to auscultation bilaterally with normal respiratory effort. CV: Nondisplaced PMI.  Heart mildly tachy, irregular S1/S2, no S3/S4, no murmur.  No peripheral edema.   Abdomen: Soft, nontender, no hepatosplenomegaly, no distention.  Skin: Intact without lesions or rashes.  Neurologic: Alert and oriented x 3.  Psych: Normal affect. Extremities: No clubbing or cyanosis.  HEENT: Normal.    Telemetry  Atrial fibrillation 100s-110s. Personally reviewed.   Labs    CBC Recent Labs    03/13/21 0350 03/14/21 0437  WBC 13.6* 10.7*  HGB 8.4* 8.4*  HCT 27.2* 27.8*  MCV 96.1 96.5  PLT 295 [id number]   Basic Metabolic Panel Recent Labs    03/13/21 0350 03/13/21 1710 03/14/21 0437 03/14/21 0500  NA 128* 131*  --  127*  K 4.3 4.2   --  4.3  CL 95* 98  --  93*  CO2 20* 22  --  23  GLUCOSE 193* 177*  --  206*  BUN 32* 29*  --  23*  CREATININE 3.44* 2.82*  --  2.86*  CALCIUM 8.6* 8.3*  --  8.7*  MG 2.0  --  2.1  --   PHOS 2.9 3.2  --  2.4*   Liver Function Tests Recent Labs    03/13/21 1710 03/14/21 0500  ALBUMIN 2.5* 2.6*    No results for input(s): LIPASE, AMYLASE in the last 72 hours. Cardiac Enzymes No results for input(s): CKTOTAL, CKMB, CKMBINDEX, TROPONINI in the last 72 hours.  BNP: BNP (last 3 results) Recent Labs    10/10/20 1336 11/07/20 0932 02/11/21 1500  BNP 126.9* 106.0* 194.3*    ProBNP (last 3 results) No results for input(s): PROBNP in the last 8760 hours.   D-Dimer No results for input(s): DDIMER in the last 72 hours.  Hemoglobin A1C No results for input(s): HGBA1C in the last 72 hours.  Fasting Lipid Panel No results for input(s): CHOL, HDL, LDLCALC, TRIG, CHOLHDL, LDLDIRECT in the last 72 hours. Thyroid Function Tests No results for input(s): TSH, T4TOTAL, T3FREE, THYROIDAB in the last 72 hours.  Invalid input(s): FREET3   Other results:  Imaging    No results found.   Medications:     Scheduled Medications:  (feeding supplement) PROSource Plus  30 mL Oral TID BM   Chlorhexidine Gluconate Cloth  6 each Topical Daily   Chlorhexidine Gluconate Cloth  6 each Topical Q0600   insulin aspart  0-5 Units Subcutaneous QHS   insulin aspart  0-9 Units Subcutaneous TID WC   mouth rinse  15 mL Mouth Rinse BID   midodrine  15 mg Oral TID WC   multivitamin  1 tablet Oral QHS   polyethylene glycol  17 g Oral Daily   senna-docusate  2 tablet Oral BID   sodium chloride flush  10-40 mL Intracatheter Q12H   sodium chloride flush  3 mL Intravenous Q12H   torsemide  80 mg Oral BID    Infusions:   prismasol BGK 4/2.5 500 mL/hr at 03/14/21 0244    prismasol BGK 4/2.5 200 mL/hr at 03/14/21 0244   amiodarone 30 mg/hr (03/14/21 0700)   bivalirudin (ANGIOMAX) infusion 0.5  mg/mL (Non-ACS indications) 0.06 mg/kg/hr (03/14/21 0700)   ceFEPime (MAXIPIME) IV Stopped (03/13/21 2322)   DOBUTamine 3 mcg/kg/min (03/14/21 0700)   norepinephrine (LEVOPHED) Adult infusion 4 mcg/min (03/14/21 0700)   prismasol BGK 4/2.5 1,500 mL/hr at 03/14/21 0245   vancomycin 2,000 mg (03/13/21 2100)    PRN Medications: acetaminophen **OR** acetaminophen, alteplase, camphor-menthol, diphenhydrAMINE, guaiFENesin-dextromethorphan, lidocaine (PF), lidocaine-prilocaine, LORazepam, menthol-cetylpyridinium, ondansetron (ZOFRAN) IV, oxyCODONE, pentafluoroprop-tetrafluoroeth, promethazine, sodium chloride, sodium chloride, sodium chloride flush, sodium chloride flush, sorbitol  Assessment/Plan   1. Acute on Chronic Biventricular Heart Failure -> cardiogenic shock:  Known NICM since 2019, previously followed by [facility].  Echo 8/21 EF < 20%, moderate LV dilation, severely decreased RV function, severe biatrial enlargement, mild MR. No history of ETOH/drugs.  No FH of cardiomyopathy (mother with [place]).  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  He is too large for cardiac MRI. RHC/LHC on 03/04/20 with no CAD low CI at 2.1. Evaluated by EP but not a candidate for ICD given BMI.  Echo this admission with EF < 20%, RV poorly Dev eloped cardiogenic shock -> AKI in setting of AFL with RVR. Dobutamine started at 2.5 and increased to 7.5.  NE added to support CVVHD. He was transitioned to [facility] but unable to tolerate this with rising CVP and respiratory distress, inability to comply with fluid restriction.  Restarted CVVH for additional fluid removal.  He remains on dobutamine 3 + NE 4 with co-ox 53%. Ongoing CVVH, pulling net 250 cc+.  Today CVP 17. Wt down 10 lbs.   - Continu CVVH over weekend to get CVP as close to 10 as possible, continue aggressive UF today.    - If he fails transition to iHD again, we are out of options. Not candidate for advanced therapies.  This has been explained to him several times. If fails to tolerate iHD off inotopes, he will need hospice.  - Will need to eventually wean off dobutamine and NE, continue midodrine.  - With AKI/hypotension, stopped Entresto, dapagliflozin, spironolactone, digoxin.  - Reinforced importance of fluid restriction  2. Aflutter/fibrillation w/ RVR:  Noted to be in Afib during a prior admission in 3/22 in the setting of CAP and a/c CHF, but converted to NSR on amiodarone. Discharged home w/ eliquis. Was in 2:1 AFL this admit in setting of a/c CHF w/ marked volume overload, + recent gastroenteritis w/ diarrhea. Has OSA but not using CPAP. TSH WNL. Had DCCV  on 8/5. He is now back in atrial fibrillation rate 100s-110s.  - Continue amiodarone gtt.  - Heparin switched to bivalirudin due to HIT. Will switch to apixaban when more stable off NRBM.  - Plan for DCCV again when fluid off => probably Monday.  3. AKI on CKD IV: Likely ATN/cardiorenal.CVVHD stopped on 8/9 after catheter clotted.  iHD started on 8/11 with 2L of volume removed. Trialysis cath switched 8/12. Tolerated iHD on 8/13 and had dialysis catheter placed 8/16.  On 8/16, CVVH restarted with respiratory distress.  - As above, 1 more trial of iHD. If cannot tolerate iHD off pressors will be hospice situation 4. Acute Hypoxic Respiratory Failure: Suspect underlying OHS/OSA exacerbated by CHF this admission. Still with significant O2 requirement => NRBM this morning.  - Continue oxygen  - Encouraged Bipap at night - Volume removal with HD 5. Type 2 DM: Farxiga held with AKI.  - continue SSI 6. OSA: Has not been on Bipap at home.  7. Super Morbid Obesity: Body mass index is 59.13 kg/m. 8. UTI: Suspect by UA, sent urine culture.  - Ceftriaxone IV course completed.  9. Hyponatremia: Improved with CVVH but back down again, 127 today  - He has been counseled on free water restriction numerous times 10. ID: With fever, started empirically on  vancomycin/cefepime.  Cultures NGTD.  CVL removed and PICC placed.  - D/c abx after today (5 days).  11. 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 Platelets recovered.  SRA+.  - Continue bivalirudin, eventually will switch to Eliquis    CRITICAL CARE Performed by: [name]  Total critical care time: 40 minutes  Critical care time was exclusive of separately billable procedures and treating other patients.  Critical care was necessary to treat or prevent imminent or life-threatening deterioration.  Critical care was time spent personally by me on the following activities: development of treatment plan with patient and/or surrogate as well as nursing, discussions with consultants, evaluation of patient's response to treatment, examination of patient, obtaining history from patient or surrogate, ordering and performing treatments and interventions, ordering and review of laboratory studies, ordering and review of radiographic studies, pulse oximetry and re-evaluation of patient's condition.  [name] 03/14/2021 7:35 AM

## 2021-03-14 NOTE — Progress Notes (Signed)
Patient has been pretty stable from respiratory perspective.  Please reach out if further assistance needed in this tough case.  Erskine Emery MD PCCM

## 2021-03-15 LAB — RENAL FUNCTION PANEL
Albumin: 2.6 g/dL — ABNORMAL LOW (ref 3.5–5.0)
Albumin: 2.9 g/dL — ABNORMAL LOW (ref 3.5–5.0)
Anion gap: 11 (ref 5–15)
Anion gap: 10 (ref 5–15)
BUN: 19 mg/dL (ref 6–20)
BUN: 16 mg/dL (ref 6–20)
CO2: 22 mmol/L (ref 22–32)
CO2: 24 mmol/L (ref 22–32)
Calcium: 8.3 mg/dL — ABNORMAL LOW (ref 8.9–10.3)
Calcium: 8.9 mg/dL (ref 8.9–10.3)
Chloride: 91 mmol/L — ABNORMAL LOW (ref 98–111)
Chloride: 92 mmol/L — ABNORMAL LOW (ref 98–111)
Creatinine, Ser: 2.9 mg/dL — ABNORMAL HIGH (ref 0.61–1.24)
Creatinine, Ser: 2.95 mg/dL — ABNORMAL HIGH (ref 0.61–1.24)
GFR, Estimated: 28 mL/min — ABNORMAL LOW (ref >60)
GFR, Estimated: 27 mL/min — ABNORMAL LOW (ref >60)
Glucose, Bld: 356 mg/dL — ABNORMAL HIGH (ref 70–99)
Glucose, Bld: 176 mg/dL — ABNORMAL HIGH (ref 70–99)
Phosphorus: 2.1 mg/dL — ABNORMAL LOW (ref 2.5–4.6)
Phosphorus: 2.9 mg/dL (ref 2.5–4.6)
Potassium: 3.9 mmol/L (ref 3.5–5.1)
Potassium: 4.6 mmol/L (ref 3.5–5.1)
Sodium: 124 mmol/L — ABNORMAL LOW (ref 135–145)
Sodium: 126 mmol/L — ABNORMAL LOW (ref 135–145)

## 2021-03-15 LAB — COOXEMETRY PANEL
Carboxyhemoglobin: 1 % (ref 0.5–1.5)
Carboxyhemoglobin: 1.1 % (ref 0.5–1.5)
Methemoglobin: 0.8 % (ref 0.0–1.5)
Methemoglobin: 0.9 % (ref 0.0–1.5)
O2 Saturation: 47 %
O2 Saturation: 57.8 %
Total hemoglobin: 8.5 g/dL — ABNORMAL LOW (ref 12.0–16.0)
Total hemoglobin: 9 g/dL — ABNORMAL LOW (ref 12.0–16.0)

## 2021-03-15 LAB — CBC
HCT: 28.8 % — ABNORMAL LOW (ref 39.0–52.0)
Hemoglobin: 8.7 g/dL — ABNORMAL LOW (ref 13.0–17.0)
MCH: 29.4 pg (ref 26.0–34.0)
MCHC: 30.2 g/dL (ref 30.0–36.0)
MCV: 97.3 fL (ref 80.0–100.0)
Platelets: 255 10*3/uL (ref 150–400)
RBC: 2.96 MIL/uL — ABNORMAL LOW (ref 4.22–5.81)
RDW: 17.2 % — ABNORMAL HIGH (ref 11.5–15.5)
WBC: 9.7 10*3/uL (ref 4.0–10.5)
nRBC: 0 % (ref 0.0–0.2)

## 2021-03-15 LAB — CULTURE, BLOOD (ROUTINE X 2)
Culture: NO GROWTH
Culture: NO GROWTH
Special Requests: ADEQUATE
Special Requests: ADEQUATE

## 2021-03-15 LAB — GLUCOSE, CAPILLARY
Glucose-Capillary: 124 mg/dL — ABNORMAL HIGH (ref 70–99)
Glucose-Capillary: 134 mg/dL — ABNORMAL HIGH (ref 70–99)
Glucose-Capillary: 111 mg/dL — ABNORMAL HIGH (ref 70–99)
Glucose-Capillary: 131 mg/dL — ABNORMAL HIGH (ref 70–99)

## 2021-03-15 LAB — MAGNESIUM: Magnesium: 2.2 mg/dL (ref 1.7–2.4)

## 2021-03-15 LAB — APTT: aPTT: 62 seconds — ABNORMAL HIGH (ref 24–36)

## 2021-03-15 NOTE — Progress Notes (Addendum)
RT unable to obtain ABG. RN obtain VBG and ran on I-stat.  Pt encourage to wear CPAP for the night. Pt stated he would wear it later on.

## 2021-03-15 NOTE — H&P (View-Only) (Signed)
Patient ID: Phillip Young, male   DOB: 08/13/1985, 35 y.o.   MRN: [id number]      Advanced Heart Failure Rounding Note  PCP-Cardiologist: Dr. [name]  Subjective:    Remains on DBA increased to 5 and NE 4. CO-OX 47% before dobutamine increased. SBPs 100s. Remains in Afib  100s-110s.   He is on CVVH, pulling net UF 250+.  Weight down 7 lbs.  CVP 16.    Afebrile, now off abx.     Objective:   Weight Range: (!) 189 kg Body mass index is 58.11 kg/m.   Vital Signs:   Temp:  [96.9 F (36.1 C)-98.7 F (37.1 C)] 98.4 F (36.9 C) (08/21 0400) Pulse Rate:  [49-122] 49 (08/21 0600) Resp:  [13-34] 23 (08/21 0600) BP: (88-148)/(38-126) 88/73 (08/21 0600) SpO2:  [91 %-100 %] 100 % (08/21 0600) Weight:  [189 kg] 189 kg (08/21 0500) Last BM Date: 03/14/21  Weight change: Filed Weights   03/13/21 0500 03/14/21 0500 03/15/21 0500  Weight: (!) 196.1 kg (!) 192.3 kg (!) 189 kg    Intake/Output:   Intake/Output Summary (Last 24 hours) at 03/15/2021 0735 Last data filed at 03/15/2021 0700 Gross per 24 hour  Intake 3352.12 ml  Output 9356 ml  Net -6003.88 ml      Physical Exam   CVP 16 General: NAD Neck: Thick.JVP 14, no thyromegaly or thyroid nodule.  Lungs: Clear to auscultation bilaterally with normal respiratory effort. CV: Nondisplaced PMI.  Heart mildly tachy, irregular S1/S2, no S3/S4, no murmur.  No peripheral edema.   Abdomen: Soft, nontender, no hepatosplenomegaly, no distention.  Skin: Intact without lesions or rashes.  Neurologic: Alert and oriented x 3.  Psych: Normal affect. Extremities: No clubbing or cyanosis.  HEENT: Normal.    Telemetry  Atrial fibrillation 100s-110s. Personally reviewed.   Labs    CBC Recent Labs    03/14/21 0437 03/15/21 0430  WBC 10.7* 9.7  HGB 8.4* 8.7*  HCT 27.8* 28.8*  MCV 96.5 97.3  PLT [id number]   Basic Metabolic Panel Recent Labs    03/14/21 0437 03/14/21 0500 03/14/21 1600 03/15/21 0430  NA  --    < > 129* 124*   K  --    < > 4.7 3.9  CL  --    < > 96* 91*  CO2  --    < > 25 22  GLUCOSE  --    < > 163* 356*  BUN  --    < > 22* 19  CREATININE  --    < > 3.01* 2.90*  CALCIUM  --    < > 8.6* 8.3*  MG 2.1  --   --  2.2  PHOS  --    < > 2.8 2.1*   < > = values in this interval not displayed.   Liver Function Tests Recent Labs    03/14/21 1600 03/15/21 0430  ALBUMIN 2.8* 2.6*    No results for input(s): LIPASE, AMYLASE in the last 72 hours. Cardiac Enzymes No results for input(s): CKTOTAL, CKMB, CKMBINDEX, TROPONINI in the last 72 hours.  BNP: BNP (last 3 results) Recent Labs    10/10/20 1336 11/07/20 0932 02/11/21 1500  BNP 126.9* 106.0* 194.3*    ProBNP (last 3 results) No results for input(s): PROBNP in the last 8760 hours.   D-Dimer No results for input(s): DDIMER in the last 72 hours.  Hemoglobin A1C No results for input(s): HGBA1C in the last 72 hours.  Fasting  Lipid Panel No results for input(s): CHOL, HDL, LDLCALC, TRIG, CHOLHDL, LDLDIRECT in the last 72 hours. Thyroid Function Tests No results for input(s): TSH, T4TOTAL, T3FREE, THYROIDAB in the last 72 hours.  Invalid input(s): FREET3   Other results:   Imaging    No results found.   Medications:     Scheduled Medications:  (feeding supplement) PROSource Plus  30 mL Oral TID BM   Chlorhexidine Gluconate Cloth  6 each Topical Daily   Chlorhexidine Gluconate Cloth  6 each Topical Q0600   insulin aspart  0-5 Units Subcutaneous QHS   insulin aspart  0-9 Units Subcutaneous TID WC   mouth rinse  15 mL Mouth Rinse BID   midodrine  15 mg Oral TID WC   multivitamin  1 tablet Oral QHS   polyethylene glycol  17 g Oral Daily   senna-docusate  2 tablet Oral BID   sodium chloride flush  10-40 mL Intracatheter Q12H   sodium chloride flush  3 mL Intravenous Q12H    Infusions:   prismasol BGK 4/2.5 500 mL/hr at 03/15/21 0610    prismasol BGK 4/2.5 200 mL/hr at 03/15/21 0610   sodium chloride Stopped  (03/14/21 1254)   amiodarone 30 mg/hr (03/15/21 0700)   bivalirudin (ANGIOMAX) infusion 0.5 mg/mL (Non-ACS indications) 0.06 mg/kg/hr (03/15/21 0700)   DOBUTamine 5 mcg/kg/min (03/15/21 0700)   norepinephrine (LEVOPHED) Adult infusion 4 mcg/min (03/15/21 0700)   prismasol BGK 4/2.5 2,000 mL/hr at 03/15/21 0610    PRN Medications: sodium chloride, acetaminophen **OR** acetaminophen, alteplase, camphor-menthol, diphenhydrAMINE, Gerhardt's butt cream, guaiFENesin-dextromethorphan, lidocaine (PF), lidocaine-prilocaine, LORazepam, menthol-cetylpyridinium, ondansetron (ZOFRAN) IV, oxyCODONE, pentafluoroprop-tetrafluoroeth, promethazine, sodium chloride, sodium chloride, sodium chloride flush, sodium chloride flush, sorbitol  Assessment/Plan   1. Acute on Chronic Biventricular Heart Failure -> cardiogenic shock:  Known NICM since 2019, previously followed by [facility].  Echo 8/21 EF < 20%, moderate LV dilation, severely decreased RV function, severe biatrial enlargement, mild MR. No history of ETOH/drugs.  No FH of cardiomyopathy (mother with [place]).  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  He is too large for cardiac MRI. RHC/LHC on 03/04/20 with no CAD low CI at 2.1. Evaluated by EP but not a candidate for ICD given BMI.  Echo this admission with EF < 20%, RV poorly Dev eloped cardiogenic shock -> AKI in setting of AFL with RVR. Dobutamine started at 2.5 and increased to 7.5.  NE added to support CVVHD. He was transitioned to [facility] but unable to tolerate this with rising CVP and respiratory distress, inability to comply with fluid restriction.  Restarted CVVH for additional fluid removal.  He remains on dobutamine 5 + NE 4 with co-ox 47% (taken on dobutamine 3). Ongoing CVVH, pulling net 250 cc+.  Today CVP 16. Wt down 7 lbs.   - Continue CVVH over weekend to get CVP as close to 10 as possible, continue aggressive UF today.    - If he fails transition to iHD  again, we are out of options. Not candidate for advanced therapies. This has been explained to him several times. If fails to tolerate iHD off inotopes, he will need hospice.  - Will need to eventually wean off dobutamine and NE, continue midodrine.  Repeat co-ox this morning.  - With AKI/hypotension, stopped Entresto, dapagliflozin, spironolactone, digoxin.  - Reinforced importance of fluid restriction  2. Aflutter/fibrillation w/ RVR:  Noted to be in Afib during a prior admission in 3/22 in the setting of CAP and a/c  CHF, but converted to NSR on amiodarone. Discharged home w/ eliquis. Was in 2:1 AFL this admit in setting of a/c CHF w/ marked volume overload, + recent gastroenteritis w/ diarrhea. Has OSA but not using CPAP. TSH WNL. Had DCCV on 8/5. He is now back in atrial fibrillation rate 100s-110s.  - Continue amiodarone gtt.  - Heparin switched to bivalirudin due to HIT. Will switch to apixaban when more stable off NRBM.  - Plan for DCCV Monday, NPO at midnight.  Discussed risks/benefits with patient and he agrees to procedure.   3. AKI on CKD IV: Likely ATN/cardiorenal.CVVHD stopped on 8/9 after catheter clotted.  iHD started on 8/11 with 2L of volume removed. Trialysis cath switched 8/12. Tolerated iHD on 8/13 and had dialysis catheter placed 8/16.  On 8/16, CVVH restarted with respiratory distress.  - As above, 1 more trial of iHD. If cannot tolerate iHD off pressors will be hospice situation 4. Acute Hypoxic Respiratory Failure: Suspect underlying OHS/OSA exacerbated by CHF this admission. Still with significant O2 requirement => NRBM this morning.  - Continue oxygen  - Encouraged Bipap at night - Volume removal with HD 5. Type 2 DM: Farxiga held with AKI.  - continue SSI 6. OSA: Has not been on Bipap at home.  7. Super Morbid Obesity: Body mass index is 58.11 kg/m. 8. UTI: Suspect by UA, sent urine culture.  - Ceftriaxone IV course completed.  9. Hyponatremia: Improved with CVVH but  back down again, 124  today  - He has been counseled on free water restriction numerous times 10. ID: With fever, started empirically on vancomycin/cefepime.  Cultures NGTD.  CVL removed and PICC placed. He completed 5 days empiric abx.  11. 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 Platelets recovered.  SRA+.  - Continue bivalirudin, eventually will switch to Eliquis    CRITICAL CARE Performed by: [name]  Total critical care time: 35 minutes  Critical care time was exclusive of separately billable procedures and treating other patients.  Critical care was necessary to treat or prevent imminent or life-threatening deterioration.  Critical care was time spent personally by me on the following activities: development of treatment plan with patient and/or surrogate as well as nursing, discussions with consultants, evaluation of patient's response to treatment, examination of patient, obtaining history from patient or surrogate, ordering and performing treatments and interventions, ordering and review of laboratory studies, ordering and review of radiographic studies, pulse oximetry and re-evaluation of patient's condition.  [name] 03/15/2021 7:35 AM

## 2021-03-15 NOTE — Progress Notes (Signed)
Patient ID: Phillip Young, male   DOB: 10/23/1985, 35 y.o.   MRN: [id number]      Advanced Heart Failure Rounding Note  PCP-Cardiologist: Dr. [name]  Subjective:    Remains on DBA increased to 5 and NE 4. CO-OX 47% before dobutamine increased. SBPs 100s. Remains in Afib  100s-110s.   He is on CVVH, pulling net UF 250+.  Weight down 7 lbs.  CVP 16.    Afebrile, now off abx.     Objective:   Weight Range: (!) 189 kg Body mass index is 58.11 kg/m.   Vital Signs:   Temp:  [96.9 F (36.1 C)-98.7 F (37.1 C)] 98.4 F (36.9 C) (08/21 0400) Pulse Rate:  [49-122] 49 (08/21 0600) Resp:  [13-34] 23 (08/21 0600) BP: (88-148)/(38-126) 88/73 (08/21 0600) SpO2:  [91 %-100 %] 100 % (08/21 0600) Weight:  [189 kg] 189 kg (08/21 0500) Last BM Date: 03/14/21  Weight change: Filed Weights   03/13/21 0500 03/14/21 0500 03/15/21 0500  Weight: (!) 196.1 kg (!) 192.3 kg (!) 189 kg    Intake/Output:   Intake/Output Summary (Last 24 hours) at 03/15/2021 0735 Last data filed at 03/15/2021 0700 Gross per 24 hour  Intake 3352.12 ml  Output 9356 ml  Net -6003.88 ml      Physical Exam   CVP 16 General: NAD Neck: Thick.JVP 14, no thyromegaly or thyroid nodule.  Lungs: Clear to auscultation bilaterally with normal respiratory effort. CV: Nondisplaced PMI.  Heart mildly tachy, irregular S1/S2, no S3/S4, no murmur.  No peripheral edema.   Abdomen: Soft, nontender, no hepatosplenomegaly, no distention.  Skin: Intact without lesions or rashes.  Neurologic: Alert and oriented x 3.  Psych: Normal affect. Extremities: No clubbing or cyanosis.  HEENT: Normal.    Telemetry  Atrial fibrillation 100s-110s. Personally reviewed.   Labs    CBC Recent Labs    03/14/21 0437 03/15/21 0430  WBC 10.7* 9.7  HGB 8.4* 8.7*  HCT 27.8* 28.8*  MCV 96.5 97.3  PLT [id number]   Basic Metabolic Panel Recent Labs    03/14/21 0437 03/14/21 0500 03/14/21 1600 03/15/21 0430  NA  --    < > 129* 124*   K  --    < > 4.7 3.9  CL  --    < > 96* 91*  CO2  --    < > 25 22  GLUCOSE  --    < > 163* 356*  BUN  --    < > 22* 19  CREATININE  --    < > 3.01* 2.90*  CALCIUM  --    < > 8.6* 8.3*  MG 2.1  --   --  2.2  PHOS  --    < > 2.8 2.1*   < > = values in this interval not displayed.   Liver Function Tests Recent Labs    03/14/21 1600 03/15/21 0430  ALBUMIN 2.8* 2.6*    No results for input(s): LIPASE, AMYLASE in the last 72 hours. Cardiac Enzymes No results for input(s): CKTOTAL, CKMB, CKMBINDEX, TROPONINI in the last 72 hours.  BNP: BNP (last 3 results) Recent Labs    10/10/20 1336 11/07/20 0932 02/11/21 1500  BNP 126.9* 106.0* 194.3*    ProBNP (last 3 results) No results for input(s): PROBNP in the last 8760 hours.   D-Dimer No results for input(s): DDIMER in the last 72 hours.  Hemoglobin A1C No results for input(s): HGBA1C in the last 72 hours.  Fasting  Lipid Panel No results for input(s): CHOL, HDL, LDLCALC, TRIG, CHOLHDL, LDLDIRECT in the last 72 hours. Thyroid Function Tests No results for input(s): TSH, T4TOTAL, T3FREE, THYROIDAB in the last 72 hours.  Invalid input(s): FREET3   Other results:   Imaging    No results found.   Medications:     Scheduled Medications:  (feeding supplement) PROSource Plus  30 mL Oral TID BM   Chlorhexidine Gluconate Cloth  6 each Topical Daily   Chlorhexidine Gluconate Cloth  6 each Topical Q0600   insulin aspart  0-5 Units Subcutaneous QHS   insulin aspart  0-9 Units Subcutaneous TID WC   mouth rinse  15 mL Mouth Rinse BID   midodrine  15 mg Oral TID WC   multivitamin  1 tablet Oral QHS   polyethylene glycol  17 g Oral Daily   senna-docusate  2 tablet Oral BID   sodium chloride flush  10-40 mL Intracatheter Q12H   sodium chloride flush  3 mL Intravenous Q12H    Infusions:   prismasol BGK 4/2.5 500 mL/hr at 03/15/21 0610    prismasol BGK 4/2.5 200 mL/hr at 03/15/21 0610   sodium chloride Stopped  (03/14/21 1254)   amiodarone 30 mg/hr (03/15/21 0700)   bivalirudin (ANGIOMAX) infusion 0.5 mg/mL (Non-ACS indications) 0.06 mg/kg/hr (03/15/21 0700)   DOBUTamine 5 mcg/kg/min (03/15/21 0700)   norepinephrine (LEVOPHED) Adult infusion 4 mcg/min (03/15/21 0700)   prismasol BGK 4/2.5 2,000 mL/hr at 03/15/21 0610    PRN Medications: sodium chloride, acetaminophen **OR** acetaminophen, alteplase, camphor-menthol, diphenhydrAMINE, Gerhardt's butt cream, guaiFENesin-dextromethorphan, lidocaine (PF), lidocaine-prilocaine, LORazepam, menthol-cetylpyridinium, ondansetron (ZOFRAN) IV, oxyCODONE, pentafluoroprop-tetrafluoroeth, promethazine, sodium chloride, sodium chloride, sodium chloride flush, sodium chloride flush, sorbitol  Assessment/Plan   1. Acute on Chronic Biventricular Heart Failure -> cardiogenic shock:  Known NICM since 2019, previously followed by [facility].  Echo 8/21 EF < 20%, moderate LV dilation, severely decreased RV function, severe biatrial enlargement, mild MR. No history of ETOH/drugs.  No FH of cardiomyopathy (mother with [place]).  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  He is too large for cardiac MRI. RHC/LHC on 03/04/20 with no CAD low CI at 2.1. Evaluated by EP but not a candidate for ICD given BMI.  Echo this admission with EF < 20%, RV poorly Dev eloped cardiogenic shock -> AKI in setting of AFL with RVR. Dobutamine started at 2.5 and increased to 7.5.  NE added to support CVVHD. He was transitioned to [facility] but unable to tolerate this with rising CVP and respiratory distress, inability to comply with fluid restriction.  Restarted CVVH for additional fluid removal.  He remains on dobutamine 5 + NE 4 with co-ox 47% (taken on dobutamine 3). Ongoing CVVH, pulling net 250 cc+.  Today CVP 16. Wt down 7 lbs.   - Continue CVVH over weekend to get CVP as close to 10 as possible, continue aggressive UF today.    - If he fails transition to iHD  again, we are out of options. Not candidate for advanced therapies. This has been explained to him several times. If fails to tolerate iHD off inotopes, he will need hospice.  - Will need to eventually wean off dobutamine and NE, continue midodrine.  Repeat co-ox this morning.  - With AKI/hypotension, stopped Entresto, dapagliflozin, spironolactone, digoxin.  - Reinforced importance of fluid restriction  2. Aflutter/fibrillation w/ RVR:  Noted to be in Afib during a prior admission in 3/22 in the setting of CAP and a/c  CHF, but converted to NSR on amiodarone. Discharged home w/ eliquis. Was in 2:1 AFL this admit in setting of a/c CHF w/ marked volume overload, + recent gastroenteritis w/ diarrhea. Has OSA but not using CPAP. TSH WNL. Had DCCV on 8/5. He is now back in atrial fibrillation rate 100s-110s.  - Continue amiodarone gtt.  - Heparin switched to bivalirudin due to HIT. Will switch to apixaban when more stable off NRBM.  - Plan for DCCV Monday, NPO at midnight.  Discussed risks/benefits with patient and he agrees to procedure.   3. AKI on CKD IV: Likely ATN/cardiorenal.CVVHD stopped on 8/9 after catheter clotted.  iHD started on 8/11 with 2L of volume removed. Trialysis cath switched 8/12. Tolerated iHD on 8/13 and had dialysis catheter placed 8/16.  On 8/16, CVVH restarted with respiratory distress.  - As above, 1 more trial of iHD. If cannot tolerate iHD off pressors will be hospice situation 4. Acute Hypoxic Respiratory Failure: Suspect underlying OHS/OSA exacerbated by CHF this admission. Still with significant O2 requirement => NRBM this morning.  - Continue oxygen  - Encouraged Bipap at night - Volume removal with HD 5. Type 2 DM: Farxiga held with AKI.  - continue SSI 6. OSA: Has not been on Bipap at home.  7. Super Morbid Obesity: Body mass index is 58.11 kg/m. 8. UTI: Suspect by UA, sent urine culture.  - Ceftriaxone IV course completed.  9. Hyponatremia: Improved with CVVH but  back down again, 124  today  - He has been counseled on free water restriction numerous times 10. ID: With fever, started empirically on vancomycin/cefepime.  Cultures NGTD.  CVL removed and PICC placed. He completed 5 days empiric abx.  11. 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 Platelets recovered.  SRA+.  - Continue bivalirudin, eventually will switch to Eliquis    CRITICAL CARE Performed by: [name]  Total critical care time: 35 minutes  Critical care time was exclusive of separately billable procedures and treating other patients.  Critical care was necessary to treat or prevent imminent or life-threatening deterioration.  Critical care was time spent personally by me on the following activities: development of treatment plan with patient and/or surrogate as well as nursing, discussions with consultants, evaluation of patient's response to treatment, examination of patient, obtaining history from patient or surrogate, ordering and performing treatments and interventions, ordering and review of laboratory studies, ordering and review of radiographic studies, pulse oximetry and re-evaluation of patient's condition.  [name] 03/15/2021 7:35 AM

## 2021-03-15 NOTE — Progress Notes (Signed)
VBG result:   PH:      7.25  PC02:   55 HC03:   23.9  BE, B:   -4

## 2021-03-15 NOTE — Progress Notes (Signed)
[facility] NEPHROLOGY PROGRESS NOTE  Assessment/ Plan:  # Acute kidney Injury: Secondary to cardiogenic shock/hemodynamically mediated in the setting of atrial flutter with RVR and underlying severe biventricular congestive heart failure.  He failed to transition to intermittent hemodialysis before and was getting CRRT to manage volume.  The CRRT held on 8/18 night because of issue with the catheter.  It will be very challenging for him to tolerate intermittent hemodialysis because of biventricular heart failure and ongoing requirement of inotropes and IV pressors.  Resumed CRRT on 8/19 and has been tolerating well with ultrafiltration.  CVP still elevated therefore plan to continue CRRT through the weekend and attempt intermittent IHD early next week.  If he cannot tolerate intermittent hemodialysis then plan to transition him to palliative/hospice care.  I increased dialysate flow rate to increase clearance.  # Hyponatremia: Secondary to CHF/impaired free water handling with poorly restricted free water intake, continue CRRT.  Poor prognostic indicator.  Discussed about restricting fluid intake.  #Hypotension/shock: With heart failure.  Currently on dobutamine, Levophed and high-dose midodrine.  Monitor blood pressure.  # Heparin-induced thrombocytopenia: Transitioned to bivalirudin from heparin after positive HIT panel on 8/11.  # Biventricular congestive heart failure:  Not a candidate for advanced therapies due to morbid obesity.  Volume management with CRRT.  Heart failure team is following.  #  Atrial flutter with RVR: He underwent cardioversion earlier but now back in atrial flutter with occasional tachycardia, on amiodarone and bivalirudin.  Plan for cardioversion tomorrow per cardiology.  Subjective: Seen and examined in bedside.  Tolerating CRRT well.  Still requiring inotrope and pressors.  Urine output is only 200 cc.  No new event. Objective Vital signs in last 24  hours: Vitals:   03/15/21 0400 03/15/21 0500 03/15/21 0600 03/15/21 0800  BP: (!) 118/107 (!) 139/108 (!) 88/73 93/74  Pulse: (!) 59 (!) 103 (!) 49 90  Resp: (!) 31 (!) 31 (!) 23 (!) 21  Temp: 98.4 F (36.9 C)   98.4 F (36.9 C)  TempSrc:    Oral  SpO2: 100% 100% 100% 100%  Weight:  (!) 189 kg    Height:       Weight change: -3.3 kg  Intake/Output Summary (Last 24 hours) at 03/15/2021 0855 Last data filed at 03/15/2021 0800 Gross per 24 hour  Intake 3018.2 ml  Output 9321 ml  Net -6302.8 ml        Labs: Basic Metabolic Panel: Recent Labs  Lab 03/14/21 0500 03/14/21 1600 03/15/21 0430  NA 127* 129* 124*  K 4.3 4.7 3.9  CL 93* 96* 91*  CO2 '23 25 22  '$ GLUCOSE 206* 163* 356*  BUN 23* 22* 19  CREATININE 2.86* 3.01* 2.90*  CALCIUM 8.7* 8.6* 8.3*  PHOS 2.4* 2.8 2.1*    Liver Function Tests: Recent Labs  Lab 03/14/21 0500 03/14/21 1600 03/15/21 0430  ALBUMIN 2.6* 2.8* 2.6*    No results for input(s): LIPASE, AMYLASE in the last 168 hours. No results for input(s): AMMONIA in the last 168 hours. CBC: Recent Labs  Lab 03/11/21 0407 03/12/21 0334 03/13/21 0350 03/14/21 0437 03/15/21 0430  WBC 14.9* 13.7* 13.6* 10.7* 9.7  HGB 9.0* 8.9* 8.4* 8.4* 8.7*  HCT 28.8* 28.4* 27.2* 27.8* 28.8*  MCV 94.7 95.3 96.1 96.5 97.3  PLT 322 287 295 276 255    Cardiac Enzymes: No results for input(s): CKTOTAL, CKMB, CKMBINDEX, TROPONINI in the last 168 hours. CBG: Recent Labs  Lab 03/13/21 2124 03/14/21 0615 03/14/21  1602 03/14/21 2145 03/15/21 0634  GLUCAP 162* 124* 121* 158* 124*     Iron Studies: No results for input(s): IRON, TIBC, TRANSFERRIN, FERRITIN in the last 72 hours. Studies/Results: No results found.  Medications: Infusions:   prismasol BGK 4/2.5 500 mL/hr at 03/15/21 0610    prismasol BGK 4/2.5 200 mL/hr at 03/15/21 0610   sodium chloride Stopped (03/14/21 1254)   amiodarone 30 mg/hr (03/15/21 0800)   bivalirudin (ANGIOMAX) infusion 0.5 mg/mL  (Non-ACS indications) 0.06 mg/kg/hr (03/15/21 0800)   DOBUTamine 5 mcg/kg/min (03/15/21 0800)   norepinephrine (LEVOPHED) Adult infusion 3 mcg/min (03/15/21 0800)   prismasol BGK 4/2.5 2,000 mL/hr at 03/15/21 0610    Scheduled Medications:  (feeding supplement) PROSource Plus  30 mL Oral TID BM   Chlorhexidine Gluconate Cloth  6 each Topical Daily   Chlorhexidine Gluconate Cloth  6 each Topical Q0600   insulin aspart  0-5 Units Subcutaneous QHS   insulin aspart  0-9 Units Subcutaneous TID WC   mouth rinse  15 mL Mouth Rinse BID   midodrine  15 mg Oral TID WC   multivitamin  1 tablet Oral QHS   polyethylene glycol  17 g Oral Daily   senna-docusate  2 tablet Oral BID   sodium chloride flush  10-40 mL Intracatheter Q12H   sodium chloride flush  3 mL Intravenous Q12H    have reviewed scheduled and prn medications.  Physical Exam: General: Morbidly obese male, not in distress.  With facemask for oxygen.  Heart:RRR, s1s2 nl Lungs: Distant and coarse breath sound Abdomen:soft, obese, nontender Extremities: Significant edema consistent with anasarca Dialysis Access: Right IJ temporary HD catheter.  Kenyana Husak Prasad Erin Obando 03/15/2021,8:55 AM  LOS: 32 days

## 2021-03-15 NOTE — Progress Notes (Signed)
ANTICOAGULATION CONSULT NOTE - Follow Up Consult  Pharmacy Consult for bivalirudin Indication: atrial fibrillation  Labs: Recent Labs    03/13/21 0350 03/13/21 1710 03/14/21 0437 03/14/21 0500 03/14/21 1600 03/15/21 0430  HGB 8.4*  --  8.4*  --   --  8.7*  HCT 27.2*  --  27.8*  --   --  28.8*  PLT 295  --  276  --   --  255  APTT 70*  --  59*  --   --  62*  CREATININE 3.44*   < >  --  2.86* 3.01* 2.90*   < > = values in this interval not displayed.     Assessment: 35yo male on heparin drip for AFib now in SR on amiodarone. Patient was on a heparin gtt, but developed symptoms of HIT and was changed to bivalirudin.   HIT Ab positive, SRA also positive. He continues on bivalirudin.   aPTT is therapeutic at 62, on bivalirudin 0.06 mg/kg/hr. No s/sx of bleeding or infusion issues. Planning on continuing crrt through weekend then transition to [facility] tomorrow.    Goal of Therapy:  aPTT 50-85 seconds   Plan:  Bivalirudin at 0.'06mg'$ /kg/h Check aPTT daily  Erin Hearing PharmD., BCPS Clinical Pharmacist 03/15/2021 7:18 AM

## 2021-03-15 NOTE — Progress Notes (Signed)
Nurse contacted CARDs fellow at aprox 0500 for coox of 47 and the other resulted labs.  Nurse was instructed to increase Dobutamine to 5 and to wait for nephrology to address electrolytes in morning rounds.   Nurse continues to monitor patient, patient connected to monitor

## 2021-03-16 DIAGNOSIS — I4891 Unspecified atrial fibrillation: Secondary | ICD-10-CM

## 2021-03-16 DIAGNOSIS — R0602 Shortness of breath: Secondary | ICD-10-CM | POA: Diagnosis not present

## 2021-03-16 LAB — RENAL FUNCTION PANEL
Albumin: 2.8 g/dL — ABNORMAL LOW (ref 3.5–5.0)
Albumin: 3.1 g/dL — ABNORMAL LOW (ref 3.5–5.0)
Anion gap: 12 (ref 5–15)
Anion gap: 11 (ref 5–15)
BUN: 13 mg/dL (ref 6–20)
BUN: 13 mg/dL (ref 6–20)
CO2: 22 mmol/L (ref 22–32)
CO2: 22 mmol/L (ref 22–32)
Calcium: 8.5 mg/dL — ABNORMAL LOW (ref 8.9–10.3)
Calcium: 8.7 mg/dL — ABNORMAL LOW (ref 8.9–10.3)
Chloride: 90 mmol/L — ABNORMAL LOW (ref 98–111)
Chloride: 92 mmol/L — ABNORMAL LOW (ref 98–111)
Creatinine, Ser: 2.92 mg/dL — ABNORMAL HIGH (ref 0.61–1.24)
Creatinine, Ser: 2.89 mg/dL — ABNORMAL HIGH (ref 0.61–1.24)
GFR, Estimated: 28 mL/min — ABNORMAL LOW (ref >60)
GFR, Estimated: 28 mL/min — ABNORMAL LOW (ref >60)
Glucose, Bld: 304 mg/dL — ABNORMAL HIGH (ref 70–99)
Glucose, Bld: 242 mg/dL — ABNORMAL HIGH (ref 70–99)
Phosphorus: 2.7 mg/dL (ref 2.5–4.6)
Phosphorus: 2.2 mg/dL — ABNORMAL LOW (ref 2.5–4.6)
Potassium: 4.5 mmol/L (ref 3.5–5.1)
Potassium: 4.7 mmol/L (ref 3.5–5.1)
Sodium: 124 mmol/L — ABNORMAL LOW (ref 135–145)
Sodium: 125 mmol/L — ABNORMAL LOW (ref 135–145)

## 2021-03-16 LAB — APTT: aPTT: 65 seconds — ABNORMAL HIGH (ref 24–36)

## 2021-03-16 LAB — CBC
HCT: 30.4 % — ABNORMAL LOW (ref 39.0–52.0)
Hemoglobin: 9 g/dL — ABNORMAL LOW (ref 13.0–17.0)
MCH: 29.2 pg (ref 26.0–34.0)
MCHC: 29.6 g/dL — ABNORMAL LOW (ref 30.0–36.0)
MCV: 98.7 fL (ref 80.0–100.0)
Platelets: 256 10*3/uL (ref 150–400)
RBC: 3.08 MIL/uL — ABNORMAL LOW (ref 4.22–5.81)
RDW: 17.3 % — ABNORMAL HIGH (ref 11.5–15.5)
WBC: 10.7 10*3/uL — ABNORMAL HIGH (ref 4.0–10.5)
nRBC: 0.2 % (ref 0.0–0.2)

## 2021-03-16 LAB — COOXEMETRY PANEL
Carboxyhemoglobin: 1 % (ref 0.5–1.5)
Methemoglobin: 0.8 % (ref 0.0–1.5)
O2 Saturation: 66.1 %
Total hemoglobin: 9.2 g/dL — ABNORMAL LOW (ref 12.0–16.0)

## 2021-03-16 LAB — GLUCOSE, CAPILLARY
Glucose-Capillary: 129 mg/dL — ABNORMAL HIGH (ref 70–99)
Glucose-Capillary: 143 mg/dL — ABNORMAL HIGH (ref 70–99)
Glucose-Capillary: 151 mg/dL — ABNORMAL HIGH (ref 70–99)
Glucose-Capillary: 121 mg/dL — ABNORMAL HIGH (ref 70–99)
Glucose-Capillary: 145 mg/dL — ABNORMAL HIGH (ref 70–99)

## 2021-03-16 LAB — MAGNESIUM: Magnesium: 2.4 mg/dL (ref 1.7–2.4)

## 2021-03-16 MED ORDER — KETAMINE HCL 50 MG/5ML IJ SOSY
0.5000 mg/kg | PREFILLED_SYRINGE | Freq: Once | INTRAMUSCULAR | Status: AC
  Administered 2021-03-16: 94 mg via INTRAVENOUS
  Filled 2021-03-16: qty 10

## 2021-03-16 MED ORDER — KETAMINE 10 MG/ML SYRINGE: 93.0000 mg | Freq: Once | Status: DC

## 2021-03-16 MED ORDER — LIDOCAINE 5 % EX PTCH: 1.0000 | MEDICATED_PATCH | CUTANEOUS | Status: DC

## 2021-03-16 MED ORDER — PHENYLEPHRINE 40 MCG/ML (10ML) SYRINGE FOR IV PUSH (FOR BLOOD PRESSURE SUPPORT)
PREFILLED_SYRINGE | INTRAVENOUS | Status: AC
  Administered 2021-03-16: 200 ug
  Filled 2021-03-16: qty 10

## 2021-03-16 MED ORDER — LIDOCAINE 4 % EX CREA
TOPICAL_CREAM | Freq: Every day | CUTANEOUS | Status: DC | PRN
  Administered 2021-03-16: 1 via TOPICAL
  Filled 2021-03-16: qty 5

## 2021-03-16 MED ORDER — PHENYLEPHRINE 40 MCG/ML (10ML) SYRINGE FOR IV PUSH (FOR BLOOD PRESSURE SUPPORT): 80.0000 ug | PREFILLED_SYRINGE | Freq: Once | INTRAVENOUS | Status: DC | PRN

## 2021-03-16 MED ORDER — MIDAZOLAM HCL 2 MG/2ML IJ SOLN
2.0000 mg | Freq: Once | INTRAMUSCULAR | Status: AC
  Administered 2021-03-16: 2 mg via INTRAVENOUS
  Filled 2021-03-16: qty 2

## 2021-03-16 NOTE — Interval H&P Note (Signed)
History and Physical Interval Note:  03/16/2021 7:39 AM  Phillip Young  has presented today for surgery, with the diagnosis of chest pain.  The various methods of treatment have been discussed with the patient and family. After consideration of risks, benefits and other options for treatment, the patient has consented to  Procedure(s): RIGHT HEART CATH (N/A) [place] as a surgical intervention.  The patient's history has been reviewed, patient examined, no change in status, stable for surgery.  I have reviewed the patient's chart and labs.  Questions were answered to the patient's satisfaction.     Chancie Lampert [employer]

## 2021-03-16 NOTE — Progress Notes (Addendum)
Phillip Young re-evaluated this afternoon.  He remains on a non-rebreather on CRRT with volume removal of ~300 mL/hr.  CVP unchanged.    The patient expressed remorse for "getting caught" with drinks and insisted that he only sips drinks.  Again, reminded him how his noncompliance with his fluid restriction continues to impede his progress, further diminishing his chance of discharging home.    He expressed understanding, but denied that he exceeds his prescribed amount of fluid.

## 2021-03-16 NOTE — Progress Notes (Signed)
ANTICOAGULATION CONSULT NOTE - Follow Up Consult  Pharmacy Consult for bivalirudin Indication: atrial fibrillation  Labs: Recent Labs    03/14/21 0437 03/14/21 0500 03/15/21 0430 03/15/21 1600 03/16/21 0408  HGB 8.4*  --  8.7*  --  9.0*  HCT 27.8*  --  28.8*  --  30.4*  PLT 276  --  255  --  256  APTT 59*  --  62*  --  65*  CREATININE  --    < > 2.90* 2.95* 2.92*   < > = values in this interval not displayed.     Assessment: 35yo male on heparin drip for AFib now in SR on amiodarone. Patient was on a heparin gtt, but developed symptoms of HIT and was changed to bivalirudin.   HIT Ab positive, SRA also positive. He continues on bivalirudin.   aPTT is therapeutic at 65, on bivalirudin 0.06 mg/kg/hr. No s/sx of bleeding or infusion issues. Likely plan for DCCV today.   Goal of Therapy:  aPTT 50-85 seconds   Plan:  Bivalirudin at 0.'06mg'$ /kg/h Check aPTT daily  Antonietta Jewel, PharmD, BCCCP Clinical Pharmacist  Phone: [phone] 03/16/2021 7:13 AM  Please check AMION for all [place] phone numbers After 10:00 PM, call [place] [phone]

## 2021-03-16 NOTE — Progress Notes (Signed)
Pt refused bed alarm, and q15 min blood pressures by cuff.   Nurse paged Dr Blossom Hoops at aprox 0500 for increasing vasopressors, abnormal labs and low bp   Nurse continues to monitor pt, pt attached to monitor

## 2021-03-16 NOTE — Progress Notes (Signed)
PT Cancellation Note  Patient Details Name: Phillip Young MRN: [id number] DOB: 07-08-1986   Cancelled Treatment:    Reason Eval/Treat Not Completed: Patient declined, no reason specified (pt stated fatigue from cardioversion and CRRt. pt sitting EOB and noted to have been mobilizing with nursing. Pt aware of cancellation policy and his repeated refusals but reports he does want therapy to continue to attempt. 2 future attempts will be made prior to sign off.)   Lakaisha Danish B Eilish Mcdaniel 03/16/2021, 10:48 AM Bayard Males, PT Acute Rehabilitation Services Pager: [phone] Office: [phone]

## 2021-03-16 NOTE — Progress Notes (Signed)
NAME:  Phillip Young, MRN:  [id number], DOB:  [birth year], LOS: 48 ADMISSION DATE:  02/11/2021, CONSULTATION DATE:  02/15/21 REFERRING MD:  Harvin Hazel HF  -- [name] , CHIEF COMPLAINT:  decompensated heart failure, renal failure    History of Present Illness:  35 yo M PMH class V obesity, biventricular heart failure, HTN, OSA presented to ED 7/20 with CC SOB, palpitations x 1 week. Associated dry cough, intermittent hypoxia, edema. Admitted to [facility] for management of acute on chronic heart failure, rapid aflutter. Got '80mg'$  IV lasix, amio. 7/21 Adv HF consulted for further assistance. 7/24 patient in the CVICU on a lasix infusion as well as dobutamine without robust response to diuresis (notably patient is refusing foley catheter placement). He was started on norepi 7/24 with persistent soft Bps amd worsening AKI, volume overload.   He improved somewhat with pressors and inotropes and PCCM signed off, however has had a prolonged hospital stay and on 8/16 had worsening volume overload and respiratory distress and was refusing to wear his CPAP so PCCM re-consulted in this setting   Pertinent  Medical History  Biventricular heart failure OSA Class V obesity GERD HTN DM2  Significant Hospital Events: Including procedures, antibiotic start and stop dates in addition to other pertinent events   7/20 admitted to [facility] for Afl RVR, acute on chronic BiV HF, hypoxia. Amio, push dose lasix  7/21 adv HF consult, started dobutamine, lasix gtt  7/22 ECHO with LVEF <20% (worse than prior). Hard to assess RV with technically difficult study  7/24 adding NE. CCM consult. Worse renal function, on lasix gtt but teetering need for CRRT  7/25 CRRT pulling 150/hr 8/16 Tunneled Hd catheter placed by IR.  Developed fever and worsening dyspnea requiring 15L O2, refusing Bipap, PCCM reconsulted 8/22 DCCV   Interim History / Subjective:   Back in atrial fibrillation. Successful fluid removal with CRRT. NPO for cardioversion.    Objective   Blood pressure 100/77, pulse 99, temperature (!) 97.3 F (36.3 C), temperature source Axillary, resp. rate (!) 31, height '5\' 11"'$  (1.803 m), weight (!) 187.9 kg, SpO2 100 %. CVP:  [2 mmHg-22 mmHg] 13 mmHg      Intake/Output Summary (Last 24 hours) at 03/16/2021 0808 Last data filed at 03/16/2021 0700 Gross per 24 hour  Intake 2728.28 ml  Output 6982 ml  Net -4253.72 ml    Filed Weights   03/14/21 0500 03/15/21 0500 03/16/21 0700  Weight: (!) 192.3 kg (!) 189 kg (!) 187.9 kg   Physical Exam  General: No distress, on NRB, morbid obesity.  Cardiac Tachy with irr rhythm, JVP difficult to see due to neck girth. HS distant.  Lungs Bilateral chest excursion , diminished at bases, no accessory muscle use while at rest. On NRB Ext with 1+ edema bilaterally, otherwise no obvious deformities Awake and alert, following commands, MAE x 4, A&O x 3  Resolved Hospital Problem list    Assessment & Plan:   Acute hypoxemic respiratory failure on Home O2, Pulmonary edema.  OSA/OHS  Acute on chronic biventricular decompensated heart failure with pulmonary edema and cardiogenic shock with EF <20% Atrial flutter w RVR Cardiorenal syndrome:   Plan:   - For cardioversion today. Will provide conscious sedation with ketamine, midazolam.  - Continue current inotropic support and attempt to wean once in sinus rhythm.  - Continue fluid removal.  - Palliative care may be necessary if cannot wean off inotropic support.  - Strongly encourage NIV use as will benefit for cardiac  support in face of sleep disordered breathing. - Continue bivalirudin   Best Practice (right click and "Reselect all SmartList Selections" daily)   Best Practice (right click and "Reselect all SmartList Selections" daily)   Diet/type: Regular consistency (see orders) DVT prophylaxis: other GI prophylaxis: N/A Lines: Dialysis Catheter Foley:  Yes, and it is still needed Code Status:  full code Last date of  multidisciplinary goals of care discussion [per HF team]  CRITICAL CARE Performed by: Kipp Brood   Total critical care time: 40 minutes  Critical care time was exclusive of separately billable procedures and treating other patients.  Critical care was necessary to treat or prevent imminent or life-threatening deterioration.  Critical care was time spent personally by me on the following activities: development of treatment plan with patient and/or surrogate as well as nursing, discussions with consultants, evaluation of patient's response to treatment, examination of patient, obtaining history from patient or surrogate, ordering and performing treatments and interventions, ordering and review of laboratory studies, ordering and review of radiographic studies, pulse oximetry, re-evaluation of patient's condition and participation in multidisciplinary rounds.  Kipp Brood, MD [facility] ICU Physician [place]  Pager: [phone] Mobile: [phone] After hours: [phone].

## 2021-03-16 NOTE — Procedures (Signed)
Electrical Cardioversion Procedure Note Phillip Young [id number] 10-10-1985  Procedure: Electrical Cardioversion Indications:  Atrial Fibrillation  Procedure Details Consent: Risks of procedure as well as the alternatives and risks of each were explained to the (patient/caregiver).  Consent for procedure obtained. Time Out: Verified patient identification, verified procedure, site/side was marked, verified correct patient position, special equipment/implants available, medications/allergies/relevent history reviewed, required imaging and test results available.  Performed  Patient placed on cardiac monitor, pulse oximetry, supplemental oxygen as necessary.  Sedation given:  Ketamine/Versed per CCM Pacer pads placed anterior and posterior chest.  Cardioverted 1 time(s).  Cardioverted at [place].  Evaluation Findings: Post procedure EKG shows: NSR Complications: None Patient did tolerate procedure well.   Phillip Young 03/16/2021, 8:02 AM

## 2021-03-16 NOTE — Progress Notes (Addendum)
Patient ID: Phillip Young, male   DOB: 01/22/1986, 35 y.o.   MRN: [id number]      Advanced Heart Failure Rounding Note  PCP-Cardiologist: Dr. [name]  Subjective:    Remains on dual inotropes w/ increased requirements over the weekend. DBA increased to 5. NE increased further overnight for hypotension, up to 8 mcg. RN just reduced back down to 6. Co-ox 66%   Remains in Afib, 110s.   He is on CVVH, pulling net UF 250+. -7L fluid removal yesterday. Net negative 4L yesterday. Wt down 2 lb.  CVP 16   Na 124   Resting w/ Bipap. Reports he feels better.    Objective:   Weight Range: (!) 187.9 kg Body mass index is 57.78 kg/m.   Vital Signs:   Temp:  [97.1 F (36.2 C)-98.4 F (36.9 C)] 97.3 F (36.3 C) (08/22 0600) Pulse Rate:  [37-115] 63 (08/22 0600) Resp:  [19-37] 24 (08/22 0600) BP: (81-136)/(44-114) 104/93 (08/22 0500) SpO2:  [72 %-100 %] 100 % (08/22 0600) Weight:  [187.9 kg] 187.9 kg (08/22 0700) Last BM Date: 03/15/21  Weight change: Filed Weights   03/14/21 0500 03/15/21 0500 03/16/21 0700  Weight: (!) 192.3 kg (!) 189 kg (!) 187.9 kg    Intake/Output:   Intake/Output Summary (Last 24 hours) at 03/16/2021 0714 Last data filed at 03/16/2021 0600 Gross per 24 hour  Intake 2786.06 ml  Output 7176 ml  Net -4389.94 ml      Physical Exam   CVP 16  General:  Well appearing, obese. No respiratory difficulty HEENT: normal Neck: supple. JVD elevated to ear . Carotids 2+ bilat; no bruits. No lymphadenopathy or thyromegaly appreciated. Cor: PMI nondisplaced. Irregularly irregular rhythm, tachy rate No rubs, gallops or murmurs. Lungs: clear Abdomen: obese, soft, nontender, nondistended. No hepatosplenomegaly. No bruits or masses. Good bowel sounds. Extremities: no cyanosis, clubbing, rash, edema Neuro: alert & oriented x 3, cranial nerves grossly intact. moves all 4 extremities w/o difficulty. Affect pleasant.    Telemetry  Atrial fibrillation 100s-110s.  Personally reviewed.   Labs    CBC Recent Labs    03/15/21 0430 03/16/21 0408  WBC 9.7 10.7*  HGB 8.7* 9.0*  HCT 28.8* 30.4*  MCV 97.3 98.7  PLT 255 [id number]   Basic Metabolic Panel Recent Labs    03/15/21 0430 03/15/21 1600 03/16/21 0408  NA 124* 126* 124*  K 3.9 4.6 4.5  CL 91* 92* 90*  CO2 '22 24 22  '$ GLUCOSE 356* 176* 304*  BUN '19 16 13  '$ CREATININE 2.90* 2.95* 2.92*  CALCIUM 8.3* 8.9 8.5*  MG 2.2  --  2.4  PHOS 2.1* 2.9 2.7   Liver Function Tests Recent Labs    03/15/21 1600 03/16/21 0408  ALBUMIN 2.9* 2.8*    No results for input(s): LIPASE, AMYLASE in the last 72 hours. Cardiac Enzymes No results for input(s): CKTOTAL, CKMB, CKMBINDEX, TROPONINI in the last 72 hours.  BNP: BNP (last 3 results) Recent Labs    10/10/20 1336 11/07/20 0932 02/11/21 1500  BNP 126.9* 106.0* 194.3*    ProBNP (last 3 results) No results for input(s): PROBNP in the last 8760 hours.   D-Dimer No results for input(s): DDIMER in the last 72 hours.  Hemoglobin A1C No results for input(s): HGBA1C in the last 72 hours.  Fasting Lipid Panel No results for input(s): CHOL, HDL, LDLCALC, TRIG, CHOLHDL, LDLDIRECT in the last 72 hours. Thyroid Function Tests No results for input(s): TSH, T4TOTAL, T3FREE, THYROIDAB in the last  72 hours.  Invalid input(s): FREET3   Other results:   Imaging    No results found.   Medications:     Scheduled Medications:  (feeding supplement) PROSource Plus  30 mL Oral TID BM   Chlorhexidine Gluconate Cloth  6 each Topical Daily   Chlorhexidine Gluconate Cloth  6 each Topical Q0600   insulin aspart  0-5 Units Subcutaneous QHS   insulin aspart  0-9 Units Subcutaneous TID WC   mouth rinse  15 mL Mouth Rinse BID   midodrine  15 mg Oral TID WC   multivitamin  1 tablet Oral QHS   polyethylene glycol  17 g Oral Daily   senna-docusate  2 tablet Oral BID   sodium chloride flush  10-40 mL Intracatheter Q12H   sodium chloride flush  3 mL  Intravenous Q12H    Infusions:   prismasol BGK 4/2.5 500 mL/hr at 03/16/21 0357    prismasol BGK 4/2.5 300 mL/hr at 03/16/21 0357   sodium chloride Stopped (03/14/21 1254)   amiodarone 30 mg/hr (03/16/21 0500)   bivalirudin (ANGIOMAX) infusion 0.5 mg/mL (Non-ACS indications) 0.06 mg/kg/hr (03/16/21 0500)   DOBUTamine 5 mcg/kg/min (03/16/21 0500)   norepinephrine (LEVOPHED) Adult infusion 8 mcg/min (03/16/21 0500)   prismasol BGK 4/2.5 2,000 mL/hr at 03/16/21 0358    PRN Medications: sodium chloride, acetaminophen **OR** acetaminophen, alteplase, camphor-menthol, diphenhydrAMINE, Gerhardt's butt cream, guaiFENesin-dextromethorphan, lidocaine (PF), lidocaine-prilocaine, LORazepam, menthol-cetylpyridinium, ondansetron (ZOFRAN) IV, oxyCODONE, pentafluoroprop-tetrafluoroeth, promethazine, sodium chloride, sodium chloride, sodium chloride flush, sodium chloride flush, sorbitol  Assessment/Plan   1. Acute on Chronic Biventricular Heart Failure -> cardiogenic shock:  Known NICM since 2019, previously [facility] by [facility].  Echo 8/21 EF < 20%, moderate LV dilation, severely decreased RV function, severe biatrial enlargement, mild MR. No history of ETOH/drugs.  No FH of cardiomyopathy (mother with [place]).  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  He is too large for cardiac MRI. RHC/LHC on 03/04/20 with no CAD low CI at 2.1. Evaluated by EP but not a candidate for ICD given BMI.  Echo this admission with EF < 20%, RV poorly Dev eloped cardiogenic shock -> AKI in setting of AFL with RVR. Dobutamine started at 2.5 and increased to 7.5.  NE added to support CVVHD. He was transitioned to [facility] but unable to tolerate this with rising CVP and respiratory distress, inability to comply with fluid restriction.  Restarted CVVH for additional fluid removal.  He remains on dobutamine 5 + NE 6 with co-ox 66%. Ongoing CVVH, pulling net 250 cc+.  Today CVP 16. Wt down 2 lbs.    - Continue CVVH  to get CVP as close to 10 as possible, continue aggressive UF today.    - If he fails transition to iHD again, we are out of options. Not candidate for advanced therapies. This has been explained to him several times. If fails to tolerate iHD off inotopes, he will need hospice.  - Will need to eventually wean off dobutamine and NE, continue midodrine. - With AKI/hypotension, stopped Entresto, dapagliflozin, spironolactone, digoxin.  - Reinforced importance of fluid restriction  2. Aflutter/fibrillation w/ RVR:  Noted to be in Afib during a prior admission in 3/22 in the setting of CAP and a/c CHF, but converted to NSR on amiodarone. Discharged home w/ eliquis. Was in 2:1 AFL this admit in setting of a/c CHF w/ marked volume overload, + recent gastroenteritis w/ diarrhea. Has OSA but not using CPAP. TSH WNL. Had DCCV on  8/5. He is now back in atrial fibrillation rate 100s-110s.  - Continue amiodarone gtt.  - Heparin switched to bivalirudin due to HIT. Will switch to apixaban when more stable off NRBM.  - Plan for DCCV today.  Discussed risks/benefits with patient and he agrees to procedure.   3. AKI on CKD IV: Likely ATN/cardiorenal.CVVHD stopped on 8/9 after catheter clotted.  iHD started on 8/11 with 2L of volume removed. Trialysis cath switched 8/12. Tolerated iHD on 8/13 and had dialysis catheter placed 8/16.  On 8/16, CVVH restarted with respiratory distress.  - As above, 1 more trial of iHD. If cannot tolerate iHD off pressors will be hospice situation 4. Acute Hypoxic Respiratory Failure: Suspect underlying OHS/OSA exacerbated by CHF this admission. Still with significant O2 requirement => NRBM this morning.  - Continue oxygen  - Encouraged Bipap at night - Volume removal with HD 5. Type 2 DM: Farxiga held with AKI.  - continue SSI 6. OSA: Has not been on Bipap at home.  7. Super Morbid Obesity: Body mass index is 57.78 kg/m. 8. UTI: Suspect by UA, sent urine culture.   - Ceftriaxone IV course completed.  9. Hyponatremia: Improved with CVVH but back down again, 124  today  - He has been counseled on free water restriction numerous times 10. ID: With fever, started empirically on vancomycin/cefepime.  Cultures NGTD.  CVL removed and PICC placed. He completed 5 days empiric abx.  11. 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 Platelets recovered.  SRA+.  - Continue bivalirudin, eventually will switch to Eliquis    Lyda Jester, PA-C  03/16/2021 7:14 AM  Patient seen with PA, agree with the above note.   Cardioversion was done this morning, patient is now back in NSR.    CVP 12 today on dobutamine 5 + NE 6.  Weight down 2 lbs (?accuracy).  CVVH has been proceeding with net UF > 200 cc/hr .   General: NAD Neck: Thick, JVP 10 cm, no thyromegaly or thyroid nodule.  Lungs: Clear to auscultation bilaterally with normal respiratory effort. CV: Nondisplaced PMI.  Heart irregular S1/S2, no S3/S4, no murmur.  No peripheral edema.   Abdomen: Soft, nontender, no hepatosplenomegaly, no distention.  Skin: Intact without lesions or rashes.  Neurologic: Alert and oriented x 3.  Psych: Normal affect. Extremities: No clubbing or cyanosis.  HEENT: Normal.   Patient converted to NSR with cardioversion.  Continue bivalirudin for now, transition to Eliquis eventually.   Will continue amiodarone while he is on vasopressors.   Co-ox 66%, decrease dobutamine to 3.  Wean NE as able based on pressure.    CVP now 12, this is the lowest he has been.  Awaiting renal evaluation this morning, could do CVVH 1 more day versus transition to iHD.  If we transition to iHD, would proceed with it fairly quickly to prevent reaccumulation of volume.  If he fails transition to [facility] again, he will be out of options.  Palliative care has seen him.   CRITICAL CARE Performed by: [name]  Total critical care time: 40  minutes  Critical care time was exclusive of separately billable procedures and treating other patients.  Critical care was necessary to treat or prevent imminent or life-threatening deterioration.  Critical care was time spent personally by me on the following activities: development of treatment plan with patient and/or surrogate as well as nursing, discussions with consultants, evaluation of patient's response to treatment, examination of patient,  obtaining history from patient or surrogate, ordering and performing treatments and interventions, ordering and review of laboratory studies, ordering and review of radiographic studies, pulse oximetry and re-evaluation of patient's condition.  [name] 03/16/2021 8:12 AM

## 2021-03-16 NOTE — Progress Notes (Signed)
For cardioversion at bedside, following agents were used: -50 mg of ketamine  -2 mg of versed -200 mcg (5 mL) of phenylephrine    Antonietta Jewel, PharmD, BCCCP Clinical Pharmacist  Phone: [phone] 03/16/2021 7:57 AM  Please check AMION for all [place] phone numbers After 10:00 PM, call [place] [phone]

## 2021-03-16 NOTE — Progress Notes (Signed)
**Phillip Phillip** Phillip Phillip  Assessment/ Plan:  # Dialysis dependent acute kidney Injury: Secondary to cardiogenic shock/hemodynamically mediated in the setting of atrial flutter with RVR and underlying severe biventricular congestive heart failure.  He failed to transition to intermittent hemodialysis before and was getting CRRT to manage volume.  The CRRT held on 8/18 night because of issue with the catheter.  It will be very challenging for him to tolerate intermittent hemodialysis because of biventricular heart failure and ongoing requirement of inotropes and IV pressors.  Resumed CRRT on 8/19 and has been tolerating well with ultrafiltration.  CVP still elevated therefore plan to continue CRRT through the weekend and attempt intermittent IHD early next week once cardiac and volume status are optimized.  If he cannot tolerate intermittent hemodialysis then plan to transition him to palliative/hospice care.  Cont curretn CRRT settings, trend K and P.  # Hyponatremia: Secondary to CHF/impaired free water handling with poorly restricted free water intake, continue CRRT.  Poor prognostic indicator.  Discussed about restricting fluid intake.   #Hypotension/shock: With heart failure.  Currently on dobutamine, Levophed and high-dose midodrine.  Monitor blood pressure.  # Heparin-induced thrombocytopenia: Transitioned to bivalirudin from heparin after positive HIT panel on 8/11.  # Biventricular congestive heart failure:  Not a candidate for advanced therapies due to morbid obesity.  Volume management with CRRT.  Heart failure team is following.  #  Atrial flutter with RVR: DCCV 8/22  Subjective:  On CRRT: All 4K, 500/2000/300 flow, Bivalirudin gtt, TDC, P 2.7 goal net UF; R IJ HD cath; 253m/hr neg No c/o, at edge of bed Remains on NE and Dobut S/p DCCV this AM, converted to NSR  Objective Vital signs in last 24 hours: Vitals:   03/16/21 0812 03/16/21 0815 03/16/21  0817 03/16/21 0839  BP: (!) 108/96 (!) 68/55 (!) 75/34   Pulse: (!) 121 96 96   Resp: (!) 24 (!) 30 (!) 30   Temp:    (!) 97.4 F (36.3 C)  TempSrc:    Oral  SpO2: 99% 100% 100%   Weight:      Height:       Weight change: -1.1 kg  Intake/Output Summary (Last 24 hours) at 03/16/2021 0910 Last data filed at 03/16/2021 0900 Gross per 24 hour  Intake 2783.95 ml  Output 7355 ml  Net -4571.05 ml        Labs: Basic Metabolic Panel: Recent Labs  Lab 03/15/21 0430 03/15/21 1600 03/16/21 0408  NA 124* 126* 124*  K 3.9 4.6 4.5  CL 91* 92* 90*  CO2 '22 24 22  '$ GLUCOSE 356* 176* 304*  BUN '19 16 13  '$ CREATININE 2.90* 2.95* 2.92*  CALCIUM 8.3* 8.9 8.5*  PHOS 2.1* 2.9 2.7    Liver Function Tests: Recent Labs  Lab 03/15/21 0430 03/15/21 1600 03/16/21 0408  ALBUMIN 2.6* 2.9* 2.8*    No results for input(s): LIPASE, AMYLASE in the last 168 hours. No results for input(s): AMMONIA in the last 168 hours. CBC: Recent Labs  Lab 03/12/21 0334 03/13/21 0350 03/14/21 0437 03/15/21 0430 03/16/21 0408  WBC 13.7* 13.6* 10.7* 9.7 10.7*  HGB 8.9* 8.4* 8.4* 8.7* 9.0*  HCT 28.4* 27.2* 27.8* 28.8* 30.4*  MCV 95.3 96.1 96.5 97.3 98.7  PLT 287 295 276 255 256    Cardiac Enzymes: No results for input(s): CKTOTAL, CKMB, CKMBINDEX, TROPONINI in the last 168 hours. CBG: Recent Labs  Lab 03/15/21 1128 03/15/21 1524 03/15/21 2255 03/16/21 0617 03/16/21 [id number]  GLUCAP 134* 111* 131* 129* 143*     Iron Studies: No results for input(s): IRON, TIBC, TRANSFERRIN, FERRITIN in the last 72 hours. Studies/Results: No results found.  Medications: Infusions:   prismasol BGK 4/2.5 500 mL/hr at 03/16/21 0901    prismasol BGK 4/2.5 300 mL/hr at 03/16/21 0357   sodium chloride Stopped (03/14/21 1254)   amiodarone 30 mg/hr (03/16/21 0900)   bivalirudin (ANGIOMAX) infusion 0.5 mg/mL (Non-ACS indications) 0.06 mg/kg/hr (03/16/21 0900)   DOBUTamine 3 mcg/kg/min (03/16/21 0900)    norepinephrine (LEVOPHED) Adult infusion 10 mcg/min (03/16/21 0900)   prismasol BGK 4/2.5 2,000 mL/hr at 03/16/21 [id number]    Scheduled Medications:  (feeding supplement) PROSource Plus  30 mL Oral TID BM   Chlorhexidine Gluconate Cloth  6 each Topical Daily   Chlorhexidine Gluconate Cloth  6 each Topical Q0600   insulin aspart  0-5 Units Subcutaneous QHS   insulin aspart  0-9 Units Subcutaneous TID WC   mouth rinse  15 mL Mouth Rinse BID   midodrine  15 mg Oral TID WC   multivitamin  1 tablet Oral QHS   polyethylene glycol  17 g Oral Daily   senna-docusate  2 tablet Oral BID   sodium chloride flush  10-40 mL Intracatheter Q12H   sodium chloride flush  3 mL Intravenous Q12H    have reviewed scheduled and prn medications.  Physical Exam: General: Morbidly obese male, not in distress.  With facemask for oxygen.   Heart:RRR, s1s2 nl Lungs: Distant and coarse breath sound Abdomen:soft, obese, nontender Extremities: Trace LEE, centrally located Dialysis Access: Right IJ temporary HD catheter.  [name] B [name] 03/16/2021,9:10 AM  LOS: 33 days

## 2021-03-16 NOTE — Progress Notes (Signed)
Patient's family member tried to sneak in 2 bottles of mountain dew. Patient attempted to conceal the mountain dew. Agricultural consultant mitigated situation. Family member refused to give mountain dew to the nurse. Charge RN educated patient and family member regarding the importance of not bringing in drinks to patient with his fluid restriction. MD paged. MD came to bedside and had a conversation with family and patient.  They agree to give all drinks to the nurse.  MD states it is ok for [place] to search bags prior to entry of patient room.

## 2021-03-16 NOTE — Progress Notes (Signed)
Provided conscious sedation for cardioversion with 50 ketamine, 2 versed. DCCV performed with patient in dissociated state.   Post sedation patient intact. Spontaneously breathing throughout. 160 mcg of phenylephrine given for hypotension during procedure.   Kipp Brood, MD [facility] ICU Physician [place]  Pager: [phone] Or Epic Secure Chat After hours: [phone].  03/16/2021, 8:30 AM

## 2021-03-16 NOTE — Plan of Care (Signed)
  Problem: Education: Goal: Knowledge of General Education information will improve Description: Including pain rating scale, medication(s)/side effects and non-pharmacologic comfort measures Outcome: Progressing   Problem: Health Behavior/Discharge Planning: Goal: Ability to manage health-related needs will improve Outcome: Progressing   Problem: Clinical Measurements: Goal: Ability to maintain clinical measurements within normal limits will improve Outcome: Progressing Goal: Will remain free from infection Outcome: Progressing Goal: Diagnostic test results will improve Outcome: Progressing Goal: Respiratory complications will improve Outcome: Progressing Goal: Cardiovascular complication will be avoided Outcome: Progressing   Problem: Clinical Measurements: Goal: Respiratory complications will improve Outcome: Progressing   Problem: Elimination: Goal: Will not experience complications related to bowel motility Outcome: Progressing   Problem: Pain Managment: Goal: General experience of comfort will improve Outcome: Progressing   Problem: Safety: Goal: Ability to remain free from injury will improve Outcome: Progressing

## 2021-03-17 LAB — RENAL FUNCTION PANEL
Albumin: 3.1 g/dL — ABNORMAL LOW (ref 3.5–5.0)
Albumin: 3.2 g/dL — ABNORMAL LOW (ref 3.5–5.0)
Anion gap: 10 (ref 5–15)
Anion gap: 10 (ref 5–15)
BUN: 13 mg/dL (ref 6–20)
BUN: 18 mg/dL (ref 6–20)
CO2: 23 mmol/L (ref 22–32)
CO2: 23 mmol/L (ref 22–32)
Calcium: 8.8 mg/dL — ABNORMAL LOW (ref 8.9–10.3)
Calcium: 9.3 mg/dL (ref 8.9–10.3)
Chloride: 91 mmol/L — ABNORMAL LOW (ref 98–111)
Chloride: 92 mmol/L — ABNORMAL LOW (ref 98–111)
Creatinine, Ser: 2.77 mg/dL — ABNORMAL HIGH (ref 0.61–1.24)
Creatinine, Ser: 3.64 mg/dL — ABNORMAL HIGH (ref 0.61–1.24)
GFR, Estimated: 29 mL/min — ABNORMAL LOW (ref >60)
GFR, Estimated: 21 mL/min — ABNORMAL LOW (ref >60)
Glucose, Bld: 165 mg/dL — ABNORMAL HIGH (ref 70–99)
Glucose, Bld: 128 mg/dL — ABNORMAL HIGH (ref 70–99)
Phosphorus: 2.5 mg/dL (ref 2.5–4.6)
Phosphorus: 3.3 mg/dL (ref 2.5–4.6)
Potassium: 4.8 mmol/L (ref 3.5–5.1)
Potassium: 5.1 mmol/L (ref 3.5–5.1)
Sodium: 124 mmol/L — ABNORMAL LOW (ref 135–145)
Sodium: 125 mmol/L — ABNORMAL LOW (ref 135–145)

## 2021-03-17 LAB — GLUCOSE, CAPILLARY
Glucose-Capillary: 128 mg/dL — ABNORMAL HIGH (ref 70–99)
Glucose-Capillary: 115 mg/dL — ABNORMAL HIGH (ref 70–99)
Glucose-Capillary: 114 mg/dL — ABNORMAL HIGH (ref 70–99)
Glucose-Capillary: 193 mg/dL — ABNORMAL HIGH (ref 70–99)
Glucose-Capillary: 215 mg/dL — ABNORMAL HIGH (ref 70–99)

## 2021-03-17 LAB — CBC
HCT: 31.5 % — ABNORMAL LOW (ref 39.0–52.0)
Hemoglobin: 9.6 g/dL — ABNORMAL LOW (ref 13.0–17.0)
MCH: 29.7 pg (ref 26.0–34.0)
MCHC: 30.5 g/dL (ref 30.0–36.0)
MCV: 97.5 fL (ref 80.0–100.0)
Platelets: 258 10*3/uL (ref 150–400)
RBC: 3.23 MIL/uL — ABNORMAL LOW (ref 4.22–5.81)
RDW: 17.3 % — ABNORMAL HIGH (ref 11.5–15.5)
WBC: 10.4 10*3/uL (ref 4.0–10.5)
nRBC: 0.3 % — ABNORMAL HIGH (ref 0.0–0.2)

## 2021-03-17 LAB — COOXEMETRY PANEL
Carboxyhemoglobin: 1 % (ref 0.5–1.5)
Carboxyhemoglobin: 1.3 % (ref 0.5–1.5)
Methemoglobin: 0.9 % (ref 0.0–1.5)
Methemoglobin: 0.9 % (ref 0.0–1.5)
O2 Saturation: 40.6 %
O2 Saturation: 41.4 %
Total hemoglobin: 11.9 g/dL — ABNORMAL LOW (ref 12.0–16.0)
Total hemoglobin: 10.1 g/dL — ABNORMAL LOW (ref 12.0–16.0)

## 2021-03-17 LAB — MAGNESIUM: Magnesium: 2.6 mg/dL — ABNORMAL HIGH (ref 1.7–2.4)

## 2021-03-17 LAB — APTT: aPTT: 63 seconds — ABNORMAL HIGH (ref 24–36)

## 2021-03-17 MED ORDER — APIXABAN 5 MG PO TABS
5.0000 mg | ORAL_TABLET | Freq: Two times a day (BID) | ORAL | Status: DC
  Administered 2021-03-17 – 2021-03-26 (×19): 5 mg via ORAL
  Filled 2021-03-17 (×19): qty 1

## 2021-03-17 MED ORDER — ANTICOAGULANT SODIUM CITRATE 4% (200MG/5ML) IV SOLN
5.0000 mL | Freq: Once | Status: AC
  Administered 2021-03-17: 5 mL
  Filled 2021-03-17: qty 5

## 2021-03-17 NOTE — Progress Notes (Signed)
[facility] NEPHROLOGY PROGRESS NOTE  Assessment/ Plan:  # Dialysis dependent acute kidney Injury: Secondary to cardiogenic shock/hemodynamically mediated in the setting of atrial flutter with RVR and underlying severe biventricular congestive heart failure.  He failed to transition to intermittent hemodialysis before and was getting CRRT to manage volume.  The CRRT held on 8/18 night because of issue with the catheter.  It will be very challenging for him to tolerate intermittent hemodialysis because of biventricular heart failure and ongoing requirement of inotropes and IV pressors.  Resumed CRRT on 8/19 and has been tolerating well with ultrafiltration.   Vol status improved, CVP down this AM Hold CRRT today, will attempt iHD tomorrow: 3.5h, TDC, 2K, 2L UF goal If he cannot tolerate intermittent hemodialysis then plan to transition him to palliative/hospice care.    # Hyponatremia: Secondary to CHF/impaired free water handling with poorly restricted free water intake, continue CRRT.  Poor prognostic indicator.  Reminded again about restricting fluid intake. Has 1.2L fluid restriction  #Hypotension/shock: With heart failure.  Currently on dobutamine, Levophed and high-dose midodrine.  Monitor blood pressure.  # Heparin-induced thrombocytopenia: Transitioned to bivalirudin from heparin after positive HIT panel on 8/11.  # Biventricular congestive heart failure:  Not a candidate for advanced therapies due to morbid obesity.  Volume management with RRT.  Heart failure team is following.  #  Atrial flutter with RVR: DCCV 8/22  Subjective:  Having some cramps On CRRT: All 4K, 500/2000/300 flow, Bivalirudin gtt, TDC, P 2.7 goal net UF; R IJ HD cath; 245m/hr neg K 4.8, P 2.5 CVP 5 this AM, d/w AHF; probably close to euvolemic Remains on NE and Dobut  Objective Vital signs in last 24 hours: Vitals:   03/17/21 0729 03/17/21 0730 03/17/21 0745 03/17/21 0900  BP:  (!) 95/57  (!) 88/62 (!) 69/58  Pulse:  81 83 82  Resp:  '20 11 15  '$ Temp: 97.8 F (36.6 C)     TempSrc: Oral     SpO2:  94% 100% 99%  Weight:      Height:       Weight change: -2.6 kg  Intake/Output Summary (Last 24 hours) at 03/17/2021 0924 Last data filed at 03/17/2021 0900 Gross per 24 hour  Intake 4134.3 ml  Output 7443 ml  Net -3308.7 ml        Labs: Basic Metabolic Panel: Recent Labs  Lab 03/16/21 0408 03/16/21 1600 03/17/21 0449  NA 124* 125* 124*  K 4.5 4.7 4.8  CL 90* 92* 91*  CO2 '22 22 23  '$ GLUCOSE 304* 242* 165*  BUN '13 13 13  '$ CREATININE 2.92* 2.89* 2.77*  CALCIUM 8.5* 8.7* 8.8*  PHOS 2.7 2.2* 2.5    Liver Function Tests: Recent Labs  Lab 03/16/21 0408 03/16/21 1600 03/17/21 0449  ALBUMIN 2.8* 3.1* 3.1*    No results for input(s): LIPASE, AMYLASE in the last 168 hours. No results for input(s): AMMONIA in the last 168 hours. CBC: Recent Labs  Lab 03/13/21 0350 03/14/21 0437 03/15/21 0430 03/16/21 0408 03/17/21 0449  WBC 13.6* 10.7* 9.7 10.7* 10.4  HGB 8.4* 8.4* 8.7* 9.0* 9.6*  HCT 27.2* 27.8* 28.8* 30.4* 31.5*  MCV 96.1 96.5 97.3 98.7 97.5  PLT 295 276 255 256 258    Cardiac Enzymes: No results for input(s): CKTOTAL, CKMB, CKMBINDEX, TROPONINI in the last 168 hours. CBG: Recent Labs  Lab 03/16/21 1117 03/16/21 1600 03/16/21 2146 03/17/21 0622 03/17/21 0726  GLUCAP 151* 121* 145* 128* 115*  Iron Studies: No results for input(s): IRON, TIBC, TRANSFERRIN, FERRITIN in the last 72 hours. Studies/Results: No results found.  Medications: Infusions:   prismasol BGK 4/2.5 500 mL/hr at 03/17/21 0650    prismasol BGK 4/2.5 300 mL/hr at 03/16/21 1701   sodium chloride Stopped (03/14/21 1254)   amiodarone 30 mg/hr (03/17/21 0900)   DOBUTamine 3 mcg/kg/min (03/17/21 0900)   norepinephrine (LEVOPHED) Adult infusion 10 mcg/min (03/17/21 0900)   prismasol BGK 4/2.5 2,000 mL/hr at 03/17/21 [id number]    Scheduled Medications:  (feeding  supplement) PROSource Plus  30 mL Oral TID BM   apixaban  5 mg Oral BID   Chlorhexidine Gluconate Cloth  6 each Topical Q0600   insulin aspart  0-5 Units Subcutaneous QHS   insulin aspart  0-9 Units Subcutaneous TID WC   mouth rinse  15 mL Mouth Rinse BID   midodrine  15 mg Oral TID WC   multivitamin  1 tablet Oral QHS   polyethylene glycol  17 g Oral Daily   senna-docusate  2 tablet Oral BID   sodium chloride flush  10-40 mL Intracatheter Q12H   sodium chloride flush  3 mL Intravenous Q12H    have reviewed scheduled and prn medications.  Physical Exam: General: Morbidly obese male, not in distress.  Sitting in chair Heart:RRR, s1s2 nl Lungs: Distant and coarse breath sound Abdomen:soft, obese, nontender Extremities: Trace LEE, centrally located Dialysis Access: Right IJ TDC.  Orene Abbasi B Naomie Crow 03/17/2021,9:24 AM  LOS: 34 days

## 2021-03-17 NOTE — Plan of Care (Signed)
  Problem: Education: Goal: Knowledge of General Education information will improve Description: Including pain rating scale, medication(s)/side effects and non-pharmacologic comfort measures Outcome: Progressing   Problem: Health Behavior/Discharge Planning: Goal: Ability to manage health-related needs will improve Outcome: Progressing   Problem: Clinical Measurements: Goal: Ability to maintain clinical measurements within normal limits will improve Outcome: Progressing Goal: Will remain free from infection Outcome: Progressing Goal: Diagnostic test results will improve Outcome: Progressing Goal: Respiratory complications will improve Outcome: Progressing Goal: Cardiovascular complication will be avoided Outcome: Progressing   Problem: Activity: Goal: Risk for activity intolerance will decrease Outcome: Progressing   Problem: Coping: Goal: Level of anxiety will decrease Outcome: Progressing   Problem: Elimination: Goal: Will not experience complications related to bowel motility Outcome: Progressing   Problem: Pain Managment: Goal: General experience of comfort will improve Outcome: Progressing   Problem: Safety: Goal: Ability to remain free from injury will improve Outcome: Progressing   Problem: Health Behavior/Discharge Planning: Goal: Ability to manage health-related needs will improve Outcome: Progressing   Problem: Clinical Measurements: Goal: Complications related to the disease process or treatment will be avoided or minimized Outcome: Progressing Goal: Dialysis access will remain free of complications Outcome: Progressing

## 2021-03-17 NOTE — Progress Notes (Signed)
Daily Progress Note   Patient Name: Phillip Young       Date: 03/17/2021 DOB: 10/15/1985  Age: 35 y.o. MRN#: [id number] Attending Physician: Larey Dresser, MD Primary Care Physician: [name], MD Admit Date: 02/11/2021  Reason for Consultation/Follow-up: Establishing goals of care  Patient Profile/HPI: 35 y.o. male  with past medical history of diabetes, OSA, CHF, morbid obesity admitted on 02/11/2021 with shortness of breath and palpitations. Workup reveals CHF exacerbation and acute renal failure in the setting of A-fib with RVR. ECHO showed EF <20%. He has been hypotensive - is on dobutamine and norepinephrine. Currently on CRRT. Scheduled for DCCV tomorrow without TE or intubation due to his high risk to not be able to extubate. He was refusing fluid restrictions and midline (but currently has accepted)- and made comment to cardiology NP that he was "tired and not sure he wanted to continue doing this". Palliative medicine consulted for goals of care. Will improved somewhat and was transitioned to iHD, however, he did not tolerate and did not comply with fluid restriction and again decompensated. Currently on CRRT, dobutamine, norepi, plan to try to transition to iHD again and retry DCCV- if fails then he will need EOL care.   Subjective: Will is sitting up. His mother and uncle are at bedside.  He and his family continue to be hopeful that her will be able to tolerate iHD. His uncle provided spiritual counseling for Will stating that if he followed God's laws then he would get better.  Will complained of pain from an abscess near his left gluteal cleft- reports this is being treated by primary team.  Review of Systems  Respiratory:  Positive for shortness of breath.      Physical Exam Vitals and nursing note reviewed.  Constitutional:      Appearance: He is obese.  Neurological:     Mental Status: He is alert.            Vital Signs: BP (!) 92/59 (BP Location: Right Arm)   Pulse 99   Temp (!) 97.4 F (36.3 C) (Oral)   Resp (!) 25   Ht '5\' 11"'$  (1.803 m)   Wt (!) 185.3 kg   SpO2 97%   BMI 56.98 kg/m  SpO2: SpO2: 97 % O2 Device: O2 Device: High Flow Nasal Cannula  O2 Flow Rate: O2 Flow Rate (L/min): 12 L/min  Intake/output summary:  Intake/Output Summary (Last 24 hours) at 03/17/2021 1239 Last data filed at 03/17/2021 1200 Gross per 24 hour  Intake 3996.27 ml  Output 6455 ml  Net -2458.73 ml   LBM: Last BM Date: 03/15/21 Baseline Weight: Weight: (!) 215.5 kg Most recent weight: Weight: (!) 185.3 kg       Palliative Assessment/Data: PPS: 50%      Patient Active Problem List   Diagnosis Date Noted   Dyspnea    Cardiogenic shock ([place])    Atrial flutter with rapid ventricular response ([place]) 02/11/2021   Elevated troponin 02/11/2021   Respiratory failure with hypoxia ([place]) 02/11/2021   CAP (community acquired pneumonia) 10/08/2020   Acute on chronic systolic congestive heart failure ([name])    Hypokalemia    CHF (congestive heart failure) ([name])    Chronic cough    Acute on chronic systolic (congestive) heart failure ([place]) 02/26/2020   Morbid obesity with body mass index (BMI) of 60.0 to 69.9 in adult [facility]) 02/26/2020   Essential hypertension 02/26/2020   GERD without esophagitis 02/26/2020   Diuretic-induced hypokalemia 02/26/2020   OSA (obstructive sleep apnea) 02/26/2020   Hidradenitis suppurativa 02/26/2020   Prediabetes 02/26/2020    Palliative Care Assessment & Plan    Assessment/Recommendations/Plan  Continue current plans PMT will continue to follow - he and his mother are aware that if he is unable to tolerate the transition to iHD then he will be at end of life   Code Status: DNR  Prognosis:  Unable to  determine  Discharge Planning: To Be Determined  Care plan was discussed with patient and his family   Thank you for allowing the Palliative Medicine Team to assist in the care of this patient.  Total time:  37 minutes Prolonged billing:   Greater than 50%  of this time was spent counseling and coordinating care related to the above assessment and plan.  Mariana Kaufman, AGNP-C Palliative Medicine   Please contact Palliative Medicine Team phone at [phone] for questions and concerns.

## 2021-03-17 NOTE — Progress Notes (Signed)
Nutrition Follow-up  DOCUMENTATION CODES:   Morbid obesity  INTERVENTION:     Double portion of protein at meals   Continue Renal MVI  Reinforce fluid and salt restriction    NUTRITION DIAGNOSIS:   Increased nutrient needs related to acute illness as evidenced by estimated needs.  GOAL:   Patient will meet greater than or equal to 90% of their needs  MONITOR:   PO intake, Supplement acceptance, Weight trends, Labs  REASON FOR ASSESSMENT:   Rounds (CRRT)    ASSESSMENT:   35 yo male admitted with acute respiratory failure with pulmonary edema with acute decompensated heart failure, cardiogenic shock. Pt requiring CRRT for volume removal. PMH includes biventricular heart failure, morbid obesity, HTN, GERD, OSA, pre-DM  Stopping CRRT today with plans for iHD tomorrow, UF today on CRRT was 300 mL/hr. If pt does not tolerate iHD then plan is for comfort care  Remains on levophed, dobutamine, bival and amiodarone drips  Hyponatremic, per report pt remains nonadherent to fluid restriction. Noted family is sneaking in beverages for patient and pt has "beverage stash" in his room. Pt denies exceeding his fluid restriction  Appetite is good, eating meals. Not taking Pro-Source supplement  Labs: sodium 124 (L) Meds: ss novolog, Rena-Vite, miralax, senna-docusate   Diet Order:   Diet Order             Diet 2 gram sodium Room service appropriate? Yes; Fluid consistency: Thin; Fluid restriction: 1200 mL Fluid  Diet effective now                   EDUCATION NEEDS:   Education needs have been addressed  Skin:  Skin Assessment: Reviewed RN Assessment  Last BM:  8/21  Height:   Ht Readings from Last 1 Encounters:  03/03/21 '5\' 11"'$  (1.803 m)    Weight:   Wt Readings from Last 1 Encounters:  03/17/21 (!) 185.3 kg    Ideal Body Weight:     BMI:  Body mass index is 56.98 kg/m.  Estimated Nutritional Needs:   Kcal:  2300-3500 kcals  Protein:  150-170  g  Fluid:  1.2 L   Kerman Passey MS, RDN, LDN, CNSC Registered Dietitian III Clinical Nutrition RD Pager and On-Call Pager Number Located in Center

## 2021-03-17 NOTE — Progress Notes (Signed)
Patient ID: Phillip Young, male   DOB: 08/08/1985, 35 y.o.   MRN: [id number]      Advanced Heart Failure Rounding Note  PCP-Cardiologist: Dr. [name]  Subjective:    Remains on dual inotropes w/ dobutamine 3 and NE 10.  UF pulling net 200-250 cc/hr negative.  Weight down 6 lbs, CVP 5 this morning.  He reports cramping.    DCCV back to NSR on 8/22, remains in NSR.   Na 124, still not compliant with fluid restrictions.   On nasal cannula today.    Objective:   Weight Range: (!) 185.3 kg Body mass index is 56.98 kg/m.   Vital Signs:   Temp:  [97.1 F (36.2 C)-97.8 F (36.6 C)] 97.8 F (36.6 C) (08/23 0729) Pulse Rate:  [56-121] 83 (08/23 0745) Resp:  [11-34] 11 (08/23 0745) BP: (52-125)/(14-104) 88/62 (08/23 0745) SpO2:  [88 %-100 %] 100 % (08/23 0745) Weight:  [185.3 kg] 185.3 kg (08/23 0600) Last BM Date: 03/15/21  Weight change: Filed Weights   03/15/21 0500 03/16/21 0700 03/17/21 0600  Weight: (!) 189 kg (!) 187.9 kg (!) 185.3 kg    Intake/Output:   Intake/Output Summary (Last 24 hours) at 03/17/2021 0803 Last data filed at 03/17/2021 0700 Gross per 24 hour  Intake 3588.84 ml  Output 7161 ml  Net -3572.16 ml      Physical Exam   CVP 5  General: NAD Neck: Thick neck. No JVD, no thyromegaly or thyroid nodule.  Lungs: Clear to auscultation bilaterally with normal respiratory effort. CV: Nondisplaced PMI.  Heart regular S1/S2, no S3/S4, no murmur.  No edema.  Abdomen: Soft, nontender, no hepatosplenomegaly, no distention.  Skin: Intact without lesions or rashes.  Neurologic: Alert and oriented x 3.  Psych: Normal affect. Extremities: No clubbing or cyanosis.  HEENT: Normal.    Telemetry   NSR 80s (personally reviewed)  Labs    CBC Recent Labs    03/16/21 0408 03/17/21 0449  WBC 10.7* 10.4  HGB 9.0* 9.6*  HCT 30.4* 31.5*  MCV 98.7 97.5  PLT 256 0000000   Basic Metabolic Panel Recent Labs    03/16/21 0408 03/16/21 1600 03/17/21 0449  NA  124* 125* 124*  K 4.5 4.7 4.8  CL 90* 92* 91*  CO2 '22 22 23  '$ GLUCOSE 304* 242* 165*  BUN '13 13 13  '$ CREATININE 2.92* 2.89* 2.77*  CALCIUM 8.5* 8.7* 8.8*  MG 2.4  --  2.6*  PHOS 2.7 2.2* 2.5   Liver Function Tests Recent Labs    03/16/21 1600 03/17/21 0449  ALBUMIN 3.1* 3.1*    No results for input(s): LIPASE, AMYLASE in the last 72 hours. Cardiac Enzymes No results for input(s): CKTOTAL, CKMB, CKMBINDEX, TROPONINI in the last 72 hours.  BNP: BNP (last 3 results) Recent Labs    10/10/20 1336 11/07/20 0932 02/11/21 1500  BNP 126.9* 106.0* 194.3*    ProBNP (last 3 results) No results for input(s): PROBNP in the last 8760 hours.   D-Dimer No results for input(s): DDIMER in the last 72 hours.  Hemoglobin A1C No results for input(s): HGBA1C in the last 72 hours.  Fasting Lipid Panel No results for input(s): CHOL, HDL, LDLCALC, TRIG, CHOLHDL, LDLDIRECT in the last 72 hours. Thyroid Function Tests No results for input(s): TSH, T4TOTAL, T3FREE, THYROIDAB in the last 72 hours.  Invalid input(s): FREET3   Other results:   Imaging    No results found.   Medications:     Scheduled Medications:  (feeding  supplement) PROSource Plus  30 mL Oral TID BM   Chlorhexidine Gluconate Cloth  6 each Topical Q0600   insulin aspart  0-5 Units Subcutaneous QHS   insulin aspart  0-9 Units Subcutaneous TID WC   mouth rinse  15 mL Mouth Rinse BID   midodrine  15 mg Oral TID WC   multivitamin  1 tablet Oral QHS   polyethylene glycol  17 g Oral Daily   senna-docusate  2 tablet Oral BID   sodium chloride flush  10-40 mL Intracatheter Q12H   sodium chloride flush  3 mL Intravenous Q12H    Infusions:   prismasol BGK 4/2.5 500 mL/hr at 03/17/21 0650    prismasol BGK 4/2.5 300 mL/hr at 03/16/21 1701   sodium chloride Stopped (03/14/21 1254)   amiodarone 30 mg/hr (03/17/21 0700)   bivalirudin (ANGIOMAX) infusion 0.5 mg/mL (Non-ACS indications) 0.06 mg/kg/hr (03/17/21 0700)    DOBUTamine 3 mcg/kg/min (03/17/21 0700)   norepinephrine (LEVOPHED) Adult infusion 10 mcg/min (03/17/21 0700)   prismasol BGK 4/2.5 2,000 mL/hr at 03/17/21 0606    PRN Medications: sodium chloride, acetaminophen **OR** acetaminophen, alteplase, camphor-menthol, diphenhydrAMINE, Gerhardt's butt cream, guaiFENesin-dextromethorphan, lidocaine, LORazepam, menthol-cetylpyridinium, ondansetron (ZOFRAN) IV, oxyCODONE, sodium chloride, sodium chloride, sodium chloride flush, sodium chloride flush  Assessment/Plan   1. Acute on Chronic Biventricular Heart Failure -> cardiogenic shock:  Known NICM since 2019, previously followed by [facility].  Echo 8/21 EF < 20%, moderate LV dilation, severely decreased RV function, severe biatrial enlargement, mild MR. No history of ETOH/drugs.  No FH of cardiomyopathy (mother with [place]).  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  He is too large for cardiac MRI. RHC/LHC on 03/04/20 with no CAD low CI at 2.1. Evaluated by EP but not a candidate for ICD given BMI.  Echo this admission with EF < 20%, RV poorly Dev eloped cardiogenic shock -> AKI in setting of AFL with RVR. Dobutamine started at 2.5 and increased to 7.5.  NE added to support CVVHD. He was transitioned to [facility] but unable to tolerate this with rising CVP and respiratory distress, inability to comply with fluid restriction.  Restarted CVVH for additional fluid removal.  He remains on dobutamine 3 + NE 10 with early am co-ox low at 40%. Ongoing CVVH, pulling net 250 cc+.  Today CVP 5. Wt down 6 lbs.  I think he is near euvolemic at this point.  - Decrease CVVH rate to net negative 0-50 cc/hr.  Will await nephrology evaluation but from a volume standpoint, ready for switch to iHD.  Transition is going to be tricky with him, not sure that he will be able to manage his volume by iHD.  Will need HD session likely tomorrow.  If he fails transition to iHD again, we are out of  options. Not candidate for advanced therapies. This has been explained to him several times. If fails to tolerate iHD off inotopes, he will need hospice.  - Will need to eventually wean off dobutamine and NE, continue midodrine.  Will repeat co-ox this morning, hopefully can come down on NE today with decreasing CVVH rate.  - With AKI/hypotension, stopped Entresto, dapagliflozin, spironolactone, digoxin.  - Reinforced importance of fluid restriction  2. Aflutter/fibrillation w/ RVR:  Noted to be in Afib during a prior admission in 3/22 in the setting of CAP and a/c CHF, but converted to NSR on amiodarone. Discharged home w/ eliquis. Was in 2:1 AFL this admit in setting of a/c CHF w/  marked volume overload, + recent gastroenteritis w/ diarrhea. Has OSA but not using CPAP. TSH WNL. Had DCCV on 8/5 and again 8/22, in NSR today.  - Continue amiodarone gtt.  - Transition to Eliquis today, stop bivalirudin.  3. AKI on CKD IV: Likely ATN/cardiorenal.CVVHD stopped on 8/9 after catheter clotted.  iHD started on 8/11 with 2L of volume removed. Trialysis cath switched 8/12. Tolerated iHD on 8/13 and had dialysis catheter placed 8/16.  On 8/16, CVVH restarted with respiratory distress.  CVP 5 today, decreasing CVVH rate.  If renal agrees, transition to [facility], would recommend first session tomorrow to keep fluid managed.  Hyponatremia remains an issue.  - As above, 1 more trial of iHD. If cannot tolerate iHD off pressors will be hospice situation 4. Acute Hypoxic Respiratory Failure: Suspect underlying OHS/OSA exacerbated by CHF this admission. Now on nasal cannula.  - Continue oxygen  - Encouraged Bipap at night - Volume removal with HD 5. Type 2 DM: Farxiga held with AKI.  - continue SSI 6. OSA: Has not been on Bipap at home.  7. Super Morbid Obesity: Body mass index is 56.98 kg/m. 8. UTI: Suspect by UA, sent urine culture.  - Ceftriaxone IV course completed.  9. Hyponatremia: Improved with CVVH but back down  again, 124 today  - He has been counseled on free water restriction numerous times. This remains an issue.  10. ID: With fever, started empirically on vancomycin/cefepime.  Cultures NGTD.  CVL removed and PICC placed. He completed 5 days empiric abx.  11. 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 Platelets recovered.  SRA+.  - switch to Eliquis today.   CRITICAL CARE Performed by: [name]  Total critical care time: 40 minutes  Critical care time was exclusive of separately billable procedures and treating other patients.  Critical care was necessary to treat or prevent imminent or life-threatening deterioration.  Critical care was time spent personally by me on the following activities: development of treatment plan with patient and/or surrogate as well as nursing, discussions with consultants, evaluation of patient's response to treatment, examination of patient, obtaining history from patient or surrogate, ordering and performing treatments and interventions, ordering and review of laboratory studies, ordering and review of radiographic studies, pulse oximetry and re-evaluation of patient's condition.  [name] 03/17/2021 8:03 AM

## 2021-03-17 NOTE — Progress Notes (Addendum)
ANTICOAGULATION CONSULT NOTE - Follow Up Consult  Pharmacy Consult for bivalirudin Indication: atrial fibrillation  Labs: Recent Labs    03/15/21 0430 03/15/21 1600 03/16/21 0408 03/16/21 1600 03/17/21 0449  HGB 8.7*  --  9.0*  --  9.6*  HCT 28.8*  --  30.4*  --  31.5*  PLT 255  --  256  --  258  APTT 62*  --  65*  --  63*  CREATININE 2.90*   < > 2.92* 2.89* 2.77*   < > = values in this interval not displayed.     Assessment: 35yo male on heparin drip for AFib now in SR on amiodarone. Patient was on a heparin gtt, but developed symptoms of HIT and was changed to bivalirudin.   HIT Ab positive, SRA also positive. He continues on bivalirudin.   aPTT is therapeutic at 63, on bivalirudin 0.06 mg/kg/hr. Hgb 9.6, plt 258 - stable. No s/sx of bleeding or infusion issues.  Goal of Therapy:  aPTT 50-85 seconds   Plan:  Bivalirudin at 0.'06mg'$ /kg/h Check aPTT daily  Antonietta Jewel, PharmD, [place] Pharmacist  Phone: [phone] 03/17/2021 7:21 AM  Please check AMION for all [name] phone numbers After 10:00 PM, call [place] [phone]  ADDENDUM No further procedures planned - will change to apixaban at this time. Given age<80 and wt>60 kg despite Scr>1.5, qualifies for full dose apixaban. Will stop bivalirudin and start apixaban 5 mg twice daily.   Antonietta Jewel, PharmD, [place] Clinical Pharmacist

## 2021-03-18 LAB — GLUCOSE, CAPILLARY
Glucose-Capillary: 126 mg/dL — ABNORMAL HIGH (ref 70–99)
Glucose-Capillary: 135 mg/dL — ABNORMAL HIGH (ref 70–99)
Glucose-Capillary: 126 mg/dL — ABNORMAL HIGH (ref 70–99)
Glucose-Capillary: 100 mg/dL — ABNORMAL HIGH (ref 70–99)
Glucose-Capillary: 122 mg/dL — ABNORMAL HIGH (ref 70–99)
Glucose-Capillary: 195 mg/dL — ABNORMAL HIGH (ref 70–99)

## 2021-03-18 LAB — RENAL FUNCTION PANEL
Albumin: 3 g/dL — ABNORMAL LOW (ref 3.5–5.0)
Albumin: 3.1 g/dL — ABNORMAL LOW (ref 3.5–5.0)
Anion gap: 11 (ref 5–15)
Anion gap: 11 (ref 5–15)
BUN: 26 mg/dL — ABNORMAL HIGH (ref 6–20)
BUN: 17 mg/dL (ref 6–20)
CO2: 23 mmol/L (ref 22–32)
CO2: 26 mmol/L (ref 22–32)
Calcium: 9.3 mg/dL (ref 8.9–10.3)
Calcium: 9 mg/dL (ref 8.9–10.3)
Chloride: 89 mmol/L — ABNORMAL LOW (ref 98–111)
Chloride: 91 mmol/L — ABNORMAL LOW (ref 98–111)
Creatinine, Ser: 4.88 mg/dL — ABNORMAL HIGH (ref 0.61–1.24)
Creatinine, Ser: 3.2 mg/dL — ABNORMAL HIGH (ref 0.61–1.24)
GFR, Estimated: 15 mL/min — ABNORMAL LOW (ref >60)
GFR, Estimated: 25 mL/min — ABNORMAL LOW (ref >60)
Glucose, Bld: 132 mg/dL — ABNORMAL HIGH (ref 70–99)
Glucose, Bld: 128 mg/dL — ABNORMAL HIGH (ref 70–99)
Phosphorus: 3.5 mg/dL (ref 2.5–4.6)
Phosphorus: 1.9 mg/dL — ABNORMAL LOW (ref 2.5–4.6)
Potassium: 5.1 mmol/L (ref 3.5–5.1)
Potassium: 4 mmol/L (ref 3.5–5.1)
Sodium: 123 mmol/L — ABNORMAL LOW (ref 135–145)
Sodium: 128 mmol/L — ABNORMAL LOW (ref 135–145)

## 2021-03-18 LAB — COOXEMETRY PANEL
Carboxyhemoglobin: 1.5 % (ref 0.5–1.5)
Methemoglobin: 0.9 % (ref 0.0–1.5)
O2 Saturation: 55.8 %
Total hemoglobin: 9.5 g/dL — ABNORMAL LOW (ref 12.0–16.0)

## 2021-03-18 LAB — CBC
HCT: 30.1 % — ABNORMAL LOW (ref 39.0–52.0)
Hemoglobin: 9.4 g/dL — ABNORMAL LOW (ref 13.0–17.0)
MCH: 30 pg (ref 26.0–34.0)
MCHC: 31.2 g/dL (ref 30.0–36.0)
MCV: 96.2 fL (ref 80.0–100.0)
Platelets: 270 10*3/uL (ref 150–400)
RBC: 3.13 MIL/uL — ABNORMAL LOW (ref 4.22–5.81)
RDW: 17.6 % — ABNORMAL HIGH (ref 11.5–15.5)
WBC: 10.7 10*3/uL — ABNORMAL HIGH (ref 4.0–10.5)
nRBC: 0.3 % — ABNORMAL HIGH (ref 0.0–0.2)

## 2021-03-18 LAB — MAGNESIUM: Magnesium: 2.5 mg/dL — ABNORMAL HIGH (ref 1.7–2.4)

## 2021-03-18 MED ORDER — ANTICOAGULANT SODIUM CITRATE 4% (200MG/5ML) IV SOLN
3.8000 mL | Status: DC | PRN
  Administered 2021-03-18 – 2021-03-25 (×4): 3.8 mL
  Filled 2021-03-18: qty 3.8
  Filled 2021-03-18: qty 5
  Filled 2021-03-18: qty 3.8
  Filled 2021-03-18: qty 5
  Filled 2021-03-18 (×2): qty 3.8

## 2021-03-18 NOTE — Procedures (Signed)
I was present at this dialysis session. I have reviewed the session itself and made appropriate changes.   SO far doing well on HD, 1h in.  Using NE. UF goal 2.5L.  Pt feels well. Mother in ROom.   Filed Weights   03/17/21 0600 03/18/21 0655 03/18/21 1230  Weight: (!) 185.3 kg (!) 188 kg (!) 188 kg    Recent Labs  Lab 03/18/21 0439  NA 123*  K 5.1  CL 89*  CO2 23  GLUCOSE 132*  BUN 26*  CREATININE 4.88*  CALCIUM 9.3  PHOS 3.5    Recent Labs  Lab 03/16/21 0408 03/17/21 0449 03/18/21 0439  WBC 10.7* 10.4 10.7*  HGB 9.0* 9.6* 9.4*  HCT 30.4* 31.5* 30.1*  MCV 98.7 97.5 96.2  PLT 256 258 270    Scheduled Meds:  apixaban  5 mg Oral BID   Chlorhexidine Gluconate Cloth  6 each Topical Q0600   insulin aspart  0-5 Units Subcutaneous QHS   insulin aspart  0-9 Units Subcutaneous TID WC   mouth rinse  15 mL Mouth Rinse BID   midodrine  15 mg Oral TID WC   multivitamin  1 tablet Oral QHS   polyethylene glycol  17 g Oral Daily   senna-docusate  2 tablet Oral BID   sodium chloride flush  10-40 mL Intracatheter Q12H   sodium chloride flush  3 mL Intravenous Q12H   Continuous Infusions:   prismasol BGK 4/2.5 500 mL/hr at 03/17/21 0650    prismasol BGK 4/2.5 300 mL/hr at 03/16/21 1701   sodium chloride Stopped (03/14/21 1254)   amiodarone 30 mg/hr (03/18/21 1223)   anticoagulant sodium citrate     DOBUTamine 3 mcg/kg/min (03/18/21 0600)   norepinephrine (LEVOPHED) Adult infusion 12 mcg/min (03/18/21 1344)   prismasol BGK 4/2.5 2,000 mL/hr at 03/17/21 0823   PRN Meds:.sodium chloride, acetaminophen **OR** acetaminophen, anticoagulant sodium citrate, camphor-menthol, diphenhydrAMINE, Gerhardt's butt cream, guaiFENesin-dextromethorphan, lidocaine, LORazepam, menthol-cetylpyridinium, ondansetron (ZOFRAN) IV, oxyCODONE, sodium chloride, sodium chloride, sodium chloride flush, sodium chloride flush   Pearson Grippe  MD 03/18/2021, 2:19 PM

## 2021-03-18 NOTE — Progress Notes (Signed)
Physical Therapy Treatment Patient Details Name: Phillip Young MRN: [id number] DOB: Dec 14, 1985 Today's Date: 03/18/2021    History of Present Illness 35 y.o. year-old admitted 7/22 w/ SOB x 1 week. CHF exacerbation. Atrial w/ RVR may have been cause of decomp CHF per cardiology team.  ECHO showed EF <20%. BP's dropped and pt moved to ICU for suspected cardiogenic shock.CRRT started 7/24 and discontinued 8/10.  PMH:  severe LV/ RV heart failure, super morbid obesity, HTN,    PT Comments    Pt getting a realistic look at what extended periods of time on CRRT without therapy looks and feels like.  Pt did surprisingly well from an ability to power up and through, but had no reserves of stamina and was generally weak from his viewpoint.      Follow Up Recommendations  No PT follow up     Equipment Recommendations  None recommended by PT    Recommendations for Other Services       Precautions / Restrictions Precautions Precautions: None    Mobility  Bed Mobility Overal bed mobility: Modified Independent                  Transfers Overall transfer level: Independent Equipment used: None                Ambulation/Gait Ambulation/Gait assistance: Min guard Gait Distance (Feet): 24 Feet (then 42, 45, 40, 15 feet with rests in between) Assistive device:  (eva walker) Gait Pattern/deviations: Step-through pattern   Gait velocity interpretation: 1.31 - 2.62 ft/sec, indicative of limited community ambulator General Gait Details: mildly unsteady, quick to fatigue with dyspnea 2-3/4, but able to talk  HR no more than 101 bpm,  Pleth noisy throughout, so SpO2 difficult to ascertain.  But >90% between trials of ambulation on [facility]   Stairs             Wheelchair Mobility    Modified Rankin (Stroke Patients Only)       Balance     Sitting balance-Leahy Scale: Fair       Standing balance-Leahy Scale: Good (to fair)                               Cognition Arousal/Alertness: Awake/alert Behavior During Therapy: WFL for tasks assessed/performed Overall Cognitive Status: Within Functional Limits for tasks assessed                                        Exercises      General Comments        Pertinent Vitals/Pain Pain Assessment: Faces Faces Pain Scale: No hurt Pain Intervention(s): Monitored during session    Home Living                      Prior Function            PT Goals (current goals can now be found in the care plan section) Acute Rehab PT Goals Patient Stated Goal: to get off the CRRT machine PT Goal Formulation: With patient Time For Goal Achievement: 03/14/21 Potential to Achieve Goals: Good Progress towards PT goals: Progressing toward goals    Frequency    Min 3X/week      PT Plan Current plan remains appropriate    Co-evaluation  AM-PAC PT "6 Clicks" Mobility   Outcome Measure  Help needed turning from your back to your side while in a flat bed without using bedrails?: None Help needed moving from lying on your back to sitting on the side of a flat bed without using bedrails?: None Help needed moving to and from a bed to a chair (including a wheelchair)?: None Help needed standing up from a chair using your arms (e.g., wheelchair or bedside chair)?: None Help needed to walk in hospital room?: A Little Help needed climbing 3-5 steps with a railing? : A Little 6 Click Score: 22    End of Session Equipment Utilized During Treatment: Oxygen Activity Tolerance: Patient tolerated treatment well;Patient limited by fatigue Patient left: with call bell/phone within reach;with nursing/sitter in room;in chair;with family/visitor present Nurse Communication: Mobility status PT Visit Diagnosis: Unsteadiness on feet (R26.81);Muscle weakness (generalized) (M62.81);Difficulty in walking, not elsewhere classified (R26.2)     Time: 1810-1850 PT Time  Calculation (min) (ACUTE ONLY): 40 min  Charges:  $Gait Training: 23-37 mins $Therapeutic Activity: 8-22 mins                     03/18/2021  Ginger Carne., PT Acute Rehabilitation Services [phone]  (pager) [phone]  (office)   Tessie Fass Guerline Happ 03/18/2021, 7:08 PM

## 2021-03-18 NOTE — Plan of Care (Signed)

## 2021-03-18 NOTE — Progress Notes (Signed)
[facility] NEPHROLOGY PROGRESS NOTE  Assessment/ Plan:  # Dialysis dependent acute kidney Injury: Secondary to cardiogenic shock/hemodynamically mediated in the setting of atrial flutter with RVR and underlying severe biventricular congestive heart failure.  Previously he failed to transition to intermittent hemodialysis before Resumed CRRT on 8/19 - 8/24 Attempt iHD today: 4h, TDC, 2K, 3L UF goal, no heparin, Na 134 If he cannot tolerate intermittent hemodialysis then plan to transition to palliative/hospice care.    # Hyponatremia: Secondary to CHF/impaired free water handling with poorly restricted free water intake.  Poor prognostic indicator.  Reminded again about restricting fluid intake. Has 1.2L fluid restriction.  Lower Na bath  #Hypotension/shock: With heart failure.  Currently on dobutamine, Levophed and high-dose midodrine.  Monitor blood pressure.  # Heparin-induced thrombocytopenia: Transitioned to bivalirudin from heparin after positive HIT panel on 8/11.  # Biventricular congestive heart failure:  Not a candidate for advanced therapies due to morbid obesity.  Volume management with RRT.  Heart failure team is following.  #  Atrial flutter with RVR: DCCV 8/22  Subjective:  No interval events Off CRRT, for IHD today Remains on norepinephrine and dobutamine CVP 14 this morning, weight up 6 pounds Serum sodium down to 123  Objective Vital signs in last 24 hours: Vitals:   03/18/21 0630 03/18/21 0655 03/18/21 0700 03/18/21 0800  BP: 108/68  (!) 134/102 (!) 89/58  Pulse: 74  80 79  Resp: 20  16 (!) 26  Temp:   98.1 F (36.7 C)   TempSrc:   Oral   SpO2: 100%  95% 92%  Weight:  (!) 188 kg    Height:       Weight change: 2.7 kg  Intake/Output Summary (Last 24 hours) at 03/18/2021 0903 Last data filed at 03/18/2021 0737 Gross per 24 hour  Intake 2524.57 ml  Output 175 ml  Net 2349.57 ml        Labs: Basic Metabolic Panel: Recent Labs  Lab  03/17/21 0449 03/17/21 1654 03/18/21 0439  NA 124* 125* 123*  K 4.8 5.1 5.1  CL 91* 92* 89*  CO2 '23 23 23  '$ GLUCOSE 165* 128* 132*  BUN 13 18 26*  CREATININE 2.77* 3.64* 4.88*  CALCIUM 8.8* 9.3 9.3  PHOS 2.5 3.3 3.5    Liver Function Tests: Recent Labs  Lab 03/17/21 0449 03/17/21 1654 03/18/21 0439  ALBUMIN 3.1* 3.2* 3.0*    No results for input(s): LIPASE, AMYLASE in the last 168 hours. No results for input(s): AMMONIA in the last 168 hours. CBC: Recent Labs  Lab 03/14/21 0437 03/15/21 0430 03/16/21 0408 03/17/21 0449 03/18/21 0439  WBC 10.7* 9.7 10.7* 10.4 10.7*  HGB 8.4* 8.7* 9.0* 9.6* 9.4*  HCT 27.8* 28.8* 30.4* 31.5* 30.1*  MCV 96.5 97.3 98.7 97.5 96.2  PLT 276 255 256 258 270    Cardiac Enzymes: No results for input(s): CKTOTAL, CKMB, CKMBINDEX, TROPONINI in the last 168 hours. CBG: Recent Labs  Lab 03/17/21 1123 03/17/21 1550 03/17/21 2148 03/18/21 0444 03/18/21 0637  GLUCAP 114* 193* 215* 135* 126*     Iron Studies: No results for input(s): IRON, TIBC, TRANSFERRIN, FERRITIN in the last 72 hours. Studies/Results: No results found.  Medications: Infusions:   prismasol BGK 4/2.5 500 mL/hr at 03/17/21 0650    prismasol BGK 4/2.5 300 mL/hr at 03/16/21 1701   sodium chloride Stopped (03/14/21 1254)   amiodarone 30 mg/hr (03/18/21 0600)   DOBUTamine 3 mcg/kg/min (03/18/21 0600)   norepinephrine (LEVOPHED) Adult infusion  8 mcg/min (03/18/21 0737)   prismasol BGK 4/2.5 2,000 mL/hr at 03/17/21 [id number]    Scheduled Medications:  apixaban  5 mg Oral BID   Chlorhexidine Gluconate Cloth  6 each Topical Q0600   insulin aspart  0-5 Units Subcutaneous QHS   insulin aspart  0-9 Units Subcutaneous TID WC   mouth rinse  15 mL Mouth Rinse BID   midodrine  15 mg Oral TID WC   multivitamin  1 tablet Oral QHS   polyethylene glycol  17 g Oral Daily   senna-docusate  2 tablet Oral BID   sodium chloride flush  10-40 mL Intracatheter Q12H   sodium chloride  flush  3 mL Intravenous Q12H    have reviewed scheduled and prn medications.  Physical Exam: General: Morbidly obese male, not in distress.  Lying in bed Heart:RRR, s1s2 nl Lungs: Distant and coarse breath sound Abdomen:soft, obese, nontender Extremities: Trace LEE, centrally located Dialysis Access: Right IJ TDC.  Dhara Schepp B Manaia Samad 03/18/2021,9:03 AM  LOS: 35 days

## 2021-03-18 NOTE — TOC Progression Note (Addendum)
Transition of Care (TOC) - Progression Note  Heart Failure  Patient Details  Name: Phillip Young MRN: [id number] Date of Birth: 1985-08-28  Transition of Care [facility]) CM/SW [place], [place] Phone Number: 03/18/2021, 2:55 PM  Clinical Narrative:    CSW spoke with the patient and his mother, Phillip Young at bedside regarding FMLA and [employer] claim. Phillip Young reported that she received a call from her son's insurance that another claim had been put through yesterday 03/17/21 so that her son wouldn't receive the money from the first claim since another had been submitted. Phillip Young called [employer] while the CSW was in the room and they explained that a case manager will be reaching out and that someone needs to review the case before they will receive any money. No other concerns presented at this time.  CSW will continue to follow throughout discharge.   Expected Discharge Plan: Home/Self Care Barriers to Discharge: Continued Medical Work up  Expected Discharge Plan and Services Expected Discharge Plan: Home/Self Care In-house Referral: Clinical Social Work Discharge Planning Services: CM Consult, HF Clinic   Living arrangements for the past 2 months: Single Family Home                                       Social Determinants of Health (SDOH) Interventions Food Insecurity Interventions: Assist with [employer] Application Financial Strain Interventions: Other (Comment) (Referral to [facility]) Housing Interventions: Intervention Not Indicated Transportation Interventions: Intervention Not Indicated  Readmission Risk Interventions Readmission Risk Prevention Plan 10/16/2020 10/16/2020 10/14/2020  Transportation Screening Complete Complete Complete  PCP or Specialist Appt within 5-7 Days Complete Complete Complete  Home Care Screening Complete - -  Medication Review (RN CM) Referral to Pharmacy - -  Some recent data might be hidden   Opal Mckellips, MSW,  [place] Heart Failure Social Worker

## 2021-03-18 NOTE — Progress Notes (Addendum)
Patient ID: Phillip Young, male   DOB: [birth year], 35 y.o.   MRN: [id number]      Advanced Heart Failure Rounding Note  PCP-Cardiologist: Dr. [name]  Subjective:    Remains on dual inotropes w/ dobutamine 3 and NE 10.  Co-ox 56%.   CRRT stopped yesterday. Wt up 6 lb. CVP trending back up 12-13 overnight.   Na 123   Maintaining NSR on amio gtt. HR 80s.   Feels ok. Sitting up in chair. Only complaint is cramping.    Objective:   Weight Range: (!) 188 kg Body mass index is 57.81 kg/m.   Vital Signs:   Temp:  [97.4 F (36.3 C)-98.1 F (36.7 C)] 98.1 F (36.7 C) (08/24 0300) Pulse Rate:  [73-99] 74 (08/24 0630) Resp:  [11-30] 20 (08/24 0630) BP: (48-144)/(28-101) 108/68 (08/24 0630) SpO2:  [93 %-100 %] 100 % (08/24 0630) Weight:  [188 kg] 188 kg (08/24 0655) Last BM Date: 03/15/21  Weight change: Filed Weights   03/16/21 0700 03/17/21 0600 03/18/21 0655  Weight: (!) 187.9 kg (!) 185.3 kg (!) 188 kg    Intake/Output:   Intake/Output Summary (Last 24 hours) at 03/18/2021 0711 Last data filed at 03/18/2021 0655 Gross per 24 hour  Intake 2883.26 ml  Output 850 ml  Net 2033.26 ml      Physical Exam   General:  Well appearing, obese. No respiratory difficulty HEENT: normal Neck: supple. Thick neck JVD not well visualized. Carotids 2+ bilat; no bruits. No lymphadenopathy or thyromegaly appreciated. Cor: PMI nondisplaced. Regular rate & rhythm. No rubs, gallops or murmurs. Lungs: clear Abdomen: obese, soft, nontender, nondistended. No hepatosplenomegaly. No bruits or masses. Good bowel sounds. Extremities: no cyanosis, clubbing, rash, edema Neuro: alert & oriented x 3, cranial nerves grossly intact. moves all 4 extremities w/o difficulty. Affect pleasant.    Telemetry   NSR 80s (personally reviewed)  Labs    CBC Recent Labs    03/17/21 0449 03/18/21 0439  WBC 10.4 10.7*  HGB 9.6* 9.4*  HCT 31.5* 30.1*  MCV 97.5 96.2  PLT 258 [id number]   Basic Metabolic  Panel Recent Labs    03/17/21 0449 03/17/21 1654 03/18/21 0439  NA 124* 125* 123*  K 4.8 5.1 5.1  CL 91* 92* 89*  CO2 '23 23 23  '$ GLUCOSE 165* 128* 132*  BUN 13 18 26*  CREATININE 2.77* 3.64* 4.88*  CALCIUM 8.8* 9.3 9.3  MG 2.6*  --  2.5*  PHOS 2.5 3.3 3.5   Liver Function Tests Recent Labs    03/17/21 1654 03/18/21 0439  ALBUMIN 3.2* 3.0*    No results for input(s): LIPASE, AMYLASE in the last 72 hours. Cardiac Enzymes No results for input(s): CKTOTAL, CKMB, CKMBINDEX, TROPONINI in the last 72 hours.  BNP: BNP (last 3 results) Recent Labs    10/10/20 1336 11/07/20 0932 02/11/21 1500  BNP 126.9* 106.0* 194.3*    ProBNP (last 3 results) No results for input(s): PROBNP in the last 8760 hours.   D-Dimer No results for input(s): DDIMER in the last 72 hours.  Hemoglobin A1C No results for input(s): HGBA1C in the last 72 hours.  Fasting Lipid Panel No results for input(s): CHOL, HDL, LDLCALC, TRIG, CHOLHDL, LDLDIRECT in the last 72 hours. Thyroid Function Tests No results for input(s): TSH, T4TOTAL, T3FREE, THYROIDAB in the last 72 hours.  Invalid input(s): FREET3   Other results:   Imaging    No results found.   Medications:     Scheduled Medications:  apixaban  5 mg Oral BID   Chlorhexidine Gluconate Cloth  6 each Topical Q0600   insulin aspart  0-5 Units Subcutaneous QHS   insulin aspart  0-9 Units Subcutaneous TID WC   mouth rinse  15 mL Mouth Rinse BID   midodrine  15 mg Oral TID WC   multivitamin  1 tablet Oral QHS   polyethylene glycol  17 g Oral Daily   senna-docusate  2 tablet Oral BID   sodium chloride flush  10-40 mL Intracatheter Q12H   sodium chloride flush  3 mL Intravenous Q12H    Infusions:   prismasol BGK 4/2.5 500 mL/hr at 03/17/21 0650    prismasol BGK 4/2.5 300 mL/hr at 03/16/21 1701   sodium chloride Stopped (03/14/21 1254)   amiodarone 30 mg/hr (03/18/21 0600)   DOBUTamine 3 mcg/kg/min (03/18/21 0600)    norepinephrine (LEVOPHED) Adult infusion 10 mcg/min (03/18/21 0600)   prismasol BGK 4/2.5 2,000 mL/hr at 03/17/21 0823    PRN Medications: sodium chloride, acetaminophen **OR** acetaminophen, alteplase, camphor-menthol, diphenhydrAMINE, Gerhardt's butt cream, guaiFENesin-dextromethorphan, lidocaine, LORazepam, menthol-cetylpyridinium, ondansetron (ZOFRAN) IV, oxyCODONE, sodium chloride, sodium chloride, sodium chloride flush, sodium chloride flush  Assessment/Plan   1. Acute on Chronic Biventricular Heart Failure -> cardiogenic shock:  Known NICM since 2019, [facility]  Echo 8/21 EF < 20%, moderate LV dilation, severely decreased RV function, severe biatrial enlargement, mild MR. No history of ETOH/drugs.  No FH of cardiomyopathy (mother with [place]).  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  He is too large for cardiac MRI. RHC/LHC on 03/04/20 with no CAD low CI at 2.1. Evaluated by EP but not a candidate for ICD given BMI.  Echo this admission with EF < 20%, RV poorly Dev eloped cardiogenic shock -> AKI in setting of AFL with RVR. Dobutamine started at 2.5 and increased to 7.5.  NE added to support CVVHD. He was transitioned to [facility] but unable to tolerate this with rising CVP and respiratory distress, inability to comply with fluid restriction.  Restarted CVVH for additional fluid removal.  He remains on dobutamine 3 + NE 10. Co-ox 56%. Good volume removal w/ CVVH w/ CVP down to 5 yesterday. CVVH stopped. Wt now up 6 lb and CVP drifting back up, 12-13 overnight.  - Plan switch to iHD today.  Transition is going to be tricky with him, not sure that he will be able to manage his volume by iHD. If he fails transition to iHD again, we are out of options. Not candidate for advanced therapies. This has been explained to him several times. If fails to tolerate iHD off inotopes, he will need hospice.  - Will need to eventually wean off  dobutamine and NE, continue midodrine.   - With AKI/hypotension, stopped Entresto, dapagliflozin, spironolactone, digoxin.  - Reinforced importance of fluid restriction  2. Aflutter/fibrillation w/ RVR:  Noted to be in Afib during a prior admission in 3/22 in the setting of CAP and a/c CHF, but converted to NSR on amiodarone. Discharged home w/ eliquis. Was in 2:1 AFL this admit in setting of a/c CHF w/ marked volume overload, + recent gastroenteritis w/ diarrhea. Has OSA but not using CPAP. TSH WNL. Had DCCV on 8/5 and again 8/22, in NSR today.  - Continue amiodarone gtt.  - Continue Eliquis  3. AKI on CKD IV: Likely ATN/cardiorenal.CVVHD stopped on 8/9 after catheter clotted.  iHD started on 8/11 with 2L of volume removed. Trialysis cath switched  8/12. Tolerated iHD on 8/13 and had dialysis catheter placed 8/16.  On 8/16, CVVH restarted with respiratory distress.  Good volume removal w/ CVVH w/ CVP down to 5 yesterday. CVVH stopped. Wt now up 6 lb and CVP drifting back up, 12-13 overnight.   - transition to iHD today, to keep fluid managed.  Hyponatremia remains an issue.  - As above, 1 more trial of iHD. If cannot tolerate iHD off pressors will be hospice situation 4. Acute Hypoxic Respiratory Failure: Suspect underlying OHS/OSA exacerbated by CHF this admission. Now on nasal cannula.  - Continue oxygen  - Encouraged Bipap at night - Volume removal with HD 5. Type 2 DM: Farxiga held with AKI.  - continue SSI 6. OSA: Has not been on Bipap at home.  7. Super Morbid Obesity: Body mass index is 57.81 kg/m. 8. UTI: Suspect by UA, sent urine culture.  - Ceftriaxone IV course completed.  9. Hyponatremia: Improved with CVVH but back down again, 123 today  - He has been counseled on free water restriction numerous times. This remains an issue.  10. ID: With fever, started empirically on vancomycin/cefepime.  Cultures NGTD.  CVL removed and PICC placed. He completed 5 days empiric abx.  11.  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 Platelets recovered.  SRA+.  - now back on Eliquis    Nelida Gores  03/18/2021 7:11 AM  Patient seen with PA, agree with the above note.   He feels fine today.  CVP about 12, weight up some after stopping CVVH.  He remains in NSR on amiodarone 30 mg/hr.  He is on NE 10 and dobutamine 2 with stable MAP.   General: NAD Neck: JVP 10-12 cm, no thyromegaly or thyroid nodule.  Lungs: Clear to auscultation bilaterally with normal respiratory effort. CV: Nondisplaced PMI.  Heart regular S1/S2, no S3/S4, no murmur.  Trace ankle edema.   Abdomen: Soft, nontender, no hepatosplenomegaly, no distention.  Skin: Intact without lesions or rashes.  Neurologic: Alert and oriented x 3.  Psych: Normal affect. Extremities: No clubbing or cyanosis.  HEENT: Normal.   He will need dialysis today, push UF for fluid removal.  If he fails iHD this time, we will be out of options for him and will need to pursue hospice care.   Will need to start slowly weaning vasoactive meds.  Decrease NE to 8 this morning, can decrease further after HD.    Continue amiodarone gtt while on vasoactive meds.  He is now on Eliquis.   CRITICAL CARE Performed by: [name]  Total critical care time: 35 minutes  Critical care time was exclusive of separately billable procedures and treating other patients.  Critical care was necessary to treat or prevent imminent or life-threatening deterioration.  Critical care was time spent personally by me on the following activities: development of treatment plan with patient and/or surrogate as well as nursing, discussions with consultants, evaluation of patient's response to treatment, examination of patient, obtaining history from patient or surrogate, ordering and performing treatments and interventions, ordering and review of laboratory studies, ordering and review of  radiographic studies, pulse oximetry and re-evaluation of patient's condition.  [name] 03/18/2021 7:37 AM

## 2021-03-19 DIAGNOSIS — R570 Cardiogenic shock: Secondary | ICD-10-CM | POA: Diagnosis not present

## 2021-03-19 DIAGNOSIS — I5023 Acute on chronic systolic (congestive) heart failure: Secondary | ICD-10-CM | POA: Diagnosis not present

## 2021-03-19 LAB — RENAL FUNCTION PANEL
Albumin: 2.8 g/dL — ABNORMAL LOW (ref 3.5–5.0)
Albumin: 2.9 g/dL — ABNORMAL LOW (ref 3.5–5.0)
Anion gap: 13 (ref 5–15)
Anion gap: 13 (ref 5–15)
BUN: 24 mg/dL — ABNORMAL HIGH (ref 6–20)
BUN: 37 mg/dL — ABNORMAL HIGH (ref 6–20)
CO2: 24 mmol/L (ref 22–32)
CO2: 21 mmol/L — ABNORMAL LOW (ref 22–32)
Calcium: 9.4 mg/dL (ref 8.9–10.3)
Calcium: 9 mg/dL (ref 8.9–10.3)
Chloride: 86 mmol/L — ABNORMAL LOW (ref 98–111)
Chloride: 90 mmol/L — ABNORMAL LOW (ref 98–111)
Creatinine, Ser: 5.15 mg/dL — ABNORMAL HIGH (ref 0.61–1.24)
Creatinine, Ser: 6.54 mg/dL — ABNORMAL HIGH (ref 0.61–1.24)
GFR, Estimated: 14 mL/min — ABNORMAL LOW (ref >60)
GFR, Estimated: 11 mL/min — ABNORMAL LOW (ref >60)
Glucose, Bld: 140 mg/dL — ABNORMAL HIGH (ref 70–99)
Glucose, Bld: 108 mg/dL — ABNORMAL HIGH (ref 70–99)
Phosphorus: 3 mg/dL (ref 2.5–4.6)
Phosphorus: 3.1 mg/dL (ref 2.5–4.6)
Potassium: 4.7 mmol/L (ref 3.5–5.1)
Potassium: 5 mmol/L (ref 3.5–5.1)
Sodium: 123 mmol/L — ABNORMAL LOW (ref 135–145)
Sodium: 124 mmol/L — ABNORMAL LOW (ref 135–145)

## 2021-03-19 LAB — CBC
HCT: 28.1 % — ABNORMAL LOW (ref 39.0–52.0)
Hemoglobin: 8.8 g/dL — ABNORMAL LOW (ref 13.0–17.0)
MCH: 29.6 pg (ref 26.0–34.0)
MCHC: 31.3 g/dL (ref 30.0–36.0)
MCV: 94.6 fL (ref 80.0–100.0)
Platelets: 216 10*3/uL (ref 150–400)
RBC: 2.97 MIL/uL — ABNORMAL LOW (ref 4.22–5.81)
RDW: 17.7 % — ABNORMAL HIGH (ref 11.5–15.5)
WBC: 9.3 10*3/uL (ref 4.0–10.5)
nRBC: 0.2 % (ref 0.0–0.2)

## 2021-03-19 LAB — COOXEMETRY PANEL
Carboxyhemoglobin: 1.5 % (ref 0.5–1.5)
Methemoglobin: 1.2 % (ref 0.0–1.5)
O2 Saturation: 45.2 %
Total hemoglobin: 7.2 g/dL — ABNORMAL LOW (ref 12.0–16.0)

## 2021-03-19 LAB — GLUCOSE, CAPILLARY
Glucose-Capillary: 96 mg/dL (ref 70–99)
Glucose-Capillary: 135 mg/dL — ABNORMAL HIGH (ref 70–99)
Glucose-Capillary: 183 mg/dL — ABNORMAL HIGH (ref 70–99)

## 2021-03-19 LAB — MAGNESIUM: Magnesium: 2.2 mg/dL (ref 1.7–2.4)

## 2021-03-19 MED ORDER — AMIODARONE HCL 200 MG PO TABS
400.0000 mg | ORAL_TABLET | Freq: Two times a day (BID) | ORAL | Status: DC
  Administered 2021-03-19 – 2021-03-26 (×15): 400 mg via ORAL
  Filled 2021-03-19 (×15): qty 2

## 2021-03-19 MED ORDER — NOREPINEPHRINE 16 MG/250ML-% IV SOLN
3.0000 ug/min | INTRAVENOUS | Status: DC
  Administered 2021-03-19: 5 ug/min via INTRAVENOUS
  Administered 2021-03-21: 4 ug/min via INTRAVENOUS
  Administered 2021-03-23: 3 ug/min via INTRAVENOUS
  Filled 2021-03-19 (×2): qty 250

## 2021-03-19 NOTE — Progress Notes (Signed)
Patient ID: Phillip Young, male   DOB: 1985/10/22, 35 y.o.   MRN: [id number]      Advanced Heart Failure Rounding Note  PCP-Cardiologist: Dr. [name]  Subjective:    Remains on dual inotropes w/ dobutamine 3 and NE 10.  Co-ox dropped to 45.2% and oxygen requirement up to 15L.  Resumed iHD yesterday with 2.5 l net UF.  Wt down 8 lbs with HD, then up 8 lbs this morning. CVP 10-11.    Na 123, down from 127.   Maintaining NSR on amio gtt. HR 80s.   He is lying in bed in no acute distress and requested bowls of cereal for breakfast.    Objective:   Weight Range: (!) 188.7 kg Body mass index is 58.02 kg/m.   Vital Signs:   Temp:  [97.5 F (36.4 C)-98.4 F (36.9 C)] 98.2 F (36.8 C) (08/25 0614) Pulse Rate:  [25-139] 82 (08/25 0645) Resp:  [14-36] 26 (08/25 0645) BP: (76-145)/(44-105) 89/58 (08/25 0630) SpO2:  [54 %-100 %] 94 % (08/25 0645) Weight:  [185.5 kg-188.7 kg] 188.7 kg (08/25 0455) Last BM Date: 03/18/21  Weight change: Filed Weights   03/18/21 1230 03/18/21 1700 03/19/21 0455  Weight: (!) 188 kg (!) 185.5 kg (!) 188.7 kg    Intake/Output:   Intake/Output Summary (Last 24 hours) at 03/19/2021 0717 Last data filed at 03/19/2021 0600 Gross per 24 hour  Intake 2450.3 ml  Output 2500 ml  Net -49.7 ml      Physical Exam   General:  Well appearing, obese. Mild SOB HEENT: RIJ HD catheter   Neck: supple. Thick neck -- JVD not well visualized. Carotids 2+ bilat; no bruits. No lymphadenopathy or thyromegaly appreciated. Cor: PMI nondisplaced. Regular rate & rhythm. No rubs, gallops or murmurs. Lungs: clear Abdomen: obese, soft, nontender, nondistended. No hepatosplenomegaly. No bruits or masses. Good bowel sounds. Extremities: no cyanosis, clubbing, rash, edema Neuro: alert & oriented x 3, cranial nerves grossly intact. moves all 4 extremities w/o difficulty. Affect pleasant.  Telemetry   NSR 80s (personally reviewed)  Labs    CBC Recent Labs     03/18/21 0439 03/19/21 0454  WBC 10.7* 9.3  HGB 9.4* 8.8*  HCT 30.1* 28.1*  MCV 96.2 94.6  PLT 270 [id number]    Basic Metabolic Panel Recent Labs    03/18/21 0439 03/18/21 1655 03/19/21 0454  NA 123* 128* 123*  K 5.1 4.0 4.7  CL 89* 91* 86*  CO2 '23 26 24  '$ GLUCOSE 132* 128* 140*  BUN 26* 17 24*  CREATININE 4.88* 3.20* 5.15*  CALCIUM 9.3 9.0 9.4  MG 2.5*  --  2.2  PHOS 3.5 1.9* 3.0    Liver Function Tests Recent Labs    03/18/21 1655 03/19/21 0454  ALBUMIN 3.1* 2.8*     No results for input(s): LIPASE, AMYLASE in the last 72 hours. Cardiac Enzymes No results for input(s): CKTOTAL, CKMB, CKMBINDEX, TROPONINI in the last 72 hours.  BNP: BNP (last 3 results) Recent Labs    10/10/20 1336 11/07/20 0932 02/11/21 1500  BNP 126.9* 106.0* 194.3*     ProBNP (last 3 results) No results for input(s): PROBNP in the last 8760 hours.   D-Dimer No results for input(s): DDIMER in the last 72 hours.  Hemoglobin A1C No results for input(s): HGBA1C in the last 72 hours.  Fasting Lipid Panel No results for input(s): CHOL, HDL, LDLCALC, TRIG, CHOLHDL, LDLDIRECT in the last 72 hours. Thyroid Function Tests No results for input(s): TSH,  T4TOTAL, T3FREE, THYROIDAB in the last 72 hours.  Invalid input(s): FREET3   Other results:   Imaging    No results found.   Medications:     Scheduled Medications:  apixaban  5 mg Oral BID   Chlorhexidine Gluconate Cloth  6 each Topical Q0600   insulin aspart  0-5 Units Subcutaneous QHS   insulin aspart  0-9 Units Subcutaneous TID WC   mouth rinse  15 mL Mouth Rinse BID   midodrine  15 mg Oral TID WC   multivitamin  1 tablet Oral QHS   polyethylene glycol  17 g Oral Daily   senna-docusate  2 tablet Oral BID   sodium chloride flush  10-40 mL Intracatheter Q12H   sodium chloride flush  3 mL Intravenous Q12H    Infusions:   prismasol BGK 4/2.5 500 mL/hr at 03/17/21 0650    prismasol BGK 4/2.5 300 mL/hr at 03/16/21 1701    sodium chloride Stopped (03/14/21 1254)   amiodarone 30 mg/hr (03/19/21 0600)   anticoagulant sodium citrate     DOBUTamine 3 mcg/kg/min (03/19/21 0600)   norepinephrine (LEVOPHED) Adult infusion 10 mcg/min (03/19/21 0600)   prismasol BGK 4/2.5 2,000 mL/hr at 03/17/21 0823    PRN Medications: sodium chloride, acetaminophen **OR** acetaminophen, anticoagulant sodium citrate, camphor-menthol, diphenhydrAMINE, Gerhardt's butt cream, guaiFENesin-dextromethorphan, lidocaine, LORazepam, menthol-cetylpyridinium, ondansetron (ZOFRAN) IV, oxyCODONE, sodium chloride, sodium chloride, sodium chloride flush, sodium chloride flush  Assessment/Plan   1. Acute on Chronic Biventricular Heart Failure -> cardiogenic shock:  Known NICM since 2019, previously followed by [facility].  Echo 8/21 EF < 20%, moderate LV dilation, severely decreased RV function, severe biatrial enlargement, mild MR. No history of ETOH/drugs.  No FH of cardiomyopathy (mother with Webb).  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  He is too large for cardiac MRI. RHC/LHC on 03/04/20 with no CAD low CI at 2.1. Evaluated by EP but not a candidate for ICD given BMI.  Echo this admission with EF < 20%, RV poorly Dev eloped cardiogenic shock -> AKI in setting of AFL with RVR. Dobutamine started at 2.5 and increased to 7.5.  NE added to support CVVHD. He was transitioned to [facility] but unable to tolerate this with rising CVP and respiratory distress, inability to comply with fluid restriction.  Restarted CVVH for additional fluid removal, but again transitioned to iHD with last session on 8/24.  CVP 10-11 (down slightly), but weight up 8 pounds (unclear if bed weight).  Decrease NE to 8  - iHD transition is going to be tricky with him, not sure that he will be able to manage his volume by iHD. If he fails transition to iHD again, we are out of options. Not candidate for advanced therapies. This has been  explained to him several times. If fails to tolerate iHD off inotopes, he will need hospice.  - Will need to eventually wean off dobutamine and NE, continue midodrine.   - With AKI/hypotension, stopped Entresto, dapagliflozin, spironolactone, digoxin.  - Reinforced importance of fluid restriction  2. Aflutter/fibrillation w/ RVR:  Noted to be in Afib during a prior admission in 3/22 in the setting of CAP and a/c CHF, but converted to NSR on amiodarone. Discharged home w/ eliquis. Was in 2:1 AFL this admit in setting of a/c CHF w/ marked volume overload, + recent gastroenteritis w/ diarrhea. Has OSA but not using CPAP. TSH WNL. Had DCCV on 8/5 and again 8/22, in NSR today.  - Switch to  PO amiodarone (400 mg BID) - Continue Eliquis  3. AKI on CKD IV: Likely ATN/cardiorenal.CVVHD stopped on 8/9 after catheter clotted.  iHD started on 8/11 with 2L of volume removed. Trialysis cath switched 8/12. Tolerated iHD on 8/13 and had dialysis catheter placed 8/16.  On 8/16, CVVH restarted with respiratory distress.    - Transition to iHD on 8/24 with mild improvement in CVP.  Co-ox decreased and weight up, however. Hyponatremia remains an issue.  - As above, 1 more trial of iHD. If cannot tolerate iHD off pressors will be hospice situation 4. Acute Hypoxic Respiratory Failure: Suspect underlying OHS/OSA exacerbated by CHF this admission. Now on nasal cannula.  - Continue oxygen  - Encouraged Bipap at night - Volume removal with HD 5. Type 2 DM: Farxiga held with AKI.  - continue SSI 6. OSA: Has not been on Bipap at home.  7. Super Morbid Obesity: Body mass index is 58.02 kg/m. 8. UTI: Suspect by UA, sent urine culture.  - Ceftriaxone IV course completed.  9. Hyponatremia: Improved with CVVH but back down again at 123 (127 yesterday) - He has been counseled on free water restriction numerous times. This remains an issue.  10. ID: With fever, started empirically on vancomycin/cefepime.  Cultures NGTD.  CVL  removed and PICC placed. He completed 5 days empiric abx.  11. 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 Platelets recovered.  SRA+.  - now back on Eliquis    Darcella Gasman, NP  03/19/2021 7:17 AM  Patient seen with NP, agree with the above note.   He feels fine today.  CVP about 10-11, successful HD yesterday but weight back up again.  Na down to 123. Early am co-ox low 45%.  He remains in NSR.    General: NAD Neck: JVP 12 cm, no thyromegaly or thyroid nodule.  Lungs: Clear to auscultation bilaterally with normal respiratory effort. CV: Nondisplaced PMI.  Heart regular S1/S2, no S3/S4, no murmur.  Trace ankle edema.  Abdomen: Soft, nontender, no hepatosplenomegaly, no distention.  Skin: Intact without lesions or rashes.  Neurologic: Alert and oriented x 3.  Psych: Normal affect. Extremities: No clubbing or cyanosis.  HEENT: Normal.   Patient tolerates HD on current pressors, but unclear that he will be able to limit volume intake between sessions.  Weight already increasing again. Next HD per nephrology, he will almost certainly require 4 days/week if iHD is successful.     Will need to start slowly weaning vasoactive meds.  Decrease NE to 5 this morning, keep dobutamine the same.  Accept SBP > 85 as long as he is not lightheaded.    To limit fluid, stop amiodarone gtt and start po amiodarone 400 mg bid.   Must limit fluid intake given low sodium.   CRITICAL CARE Performed by: [name]  Total critical care time: 35 minutes  Critical care time was exclusive of separately billable procedures and treating other patients.  Critical care was necessary to treat or prevent imminent or life-threatening deterioration.  Critical care was time spent personally by me on the following activities: development of treatment plan with patient and/or surrogate as well as nursing, discussions with consultants, evaluation of patient's  response to treatment, examination of patient, obtaining history from patient or surrogate, ordering and performing treatments and interventions, ordering and review of laboratory studies, ordering and review of radiographic studies, pulse oximetry and re-evaluation of patient's condition.  [name] 03/19/2021 7:48 AM

## 2021-03-19 NOTE — Progress Notes (Signed)
Physical Therapy Treatment Patient Details Name: Phillip Young MRN: [id number] DOB: Aug 27, 1985 Today's Date: 03/19/2021    History of Present Illness 35 y.o. year-old admitted 7/22 w/ SOB x 1 week. CHF exacerbation. Atrial w/ RVR may have been cause of decomp CHF per cardiology team.  ECHO showed EF <20%. BP's dropped and pt moved to ICU for suspected cardiogenic shock.CRRT started 7/24 and discontinued 8/10.  PMH:  severe LV/ RV heart failure, super morbid obesity, HTN,    PT Comments    Pt agreeable to participating with little encouragement.  Emphasis on progression of gait on 10L using EVA walker with stress on posture, stamina and maintaining stable vitals.    Follow Up Recommendations  No PT follow up     Equipment Recommendations  None recommended by PT    Recommendations for Other Services       Precautions / Restrictions Precautions Precautions: Other (comment) (SpO2 monitor)    Mobility  Bed Mobility Overal bed mobility: Modified Independent                  Transfers Overall transfer level: Independent Equipment used: None             General transfer comment: mod I/I from even lower surfaces.  Ambulation/Gait Ambulation/Gait assistance: Min guard Gait Distance (Feet): 370 Feet (total, sitting rests at 150 and 280 feet levels.) Assistive device:  (EVA Walker) Gait Pattern/deviations: Step-through pattern   Gait velocity interpretation: >2.62 ft/sec, indicative of community ambulatory General Gait Details: variable from steady to mildly unsteady with mild dyspnea or lightheadedness.   Stairs             Wheelchair Mobility    Modified Rankin (Stroke Patients Only)       Balance Overall balance assessment: Needs assistance   Sitting balance-Leahy Scale: Good       Standing balance-Leahy Scale: Good Standing balance comment: But prefers use of the EVA over standing without assist                             Cognition Arousal/Alertness: Awake/alert Behavior During Therapy: WFL for tasks assessed/performed Overall Cognitive Status: Within Functional Limits for tasks assessed                                        Exercises      General Comments General comments (skin integrity, edema, etc.): vss overall.  SpO2 during gait on 10L HFNC at 92-97% generally with one instance at 90%  HR less than 103 bpm      Pertinent Vitals/Pain Pain Assessment: Faces Faces Pain Scale: No hurt Pain Intervention(s): Monitored during session    Home Living                      Prior Function            PT Goals (current goals can now be found in the care plan section) Acute Rehab PT Goals PT Goal Formulation: With patient Time For Goal Achievement: 03/14/21 Potential to Achieve Goals: Good Progress towards PT goals: Progressing toward goals (continue goals)    Frequency    Min 3X/week      PT Plan Current plan remains appropriate    Co-evaluation              AM-PAC PT "6  Clicks" Mobility   Outcome Measure  Help needed turning from your back to your side while in a flat bed without using bedrails?: None Help needed moving from lying on your back to sitting on the side of a flat bed without using bedrails?: None Help needed moving to and from a bed to a chair (including a wheelchair)?: None Help needed standing up from a chair using your arms (e.g., wheelchair or bedside chair)?: None Help needed to walk in hospital room?: A Little Help needed climbing 3-5 steps with a railing? : A Little 6 Click Score: 22    End of Session Equipment Utilized During Treatment: Oxygen Activity Tolerance: Patient tolerated treatment well;Patient limited by fatigue Patient left: with call bell/phone within reach;in chair Nurse Communication: Mobility status PT Visit Diagnosis: Difficulty in walking, not elsewhere classified (R26.2);Other abnormalities of gait and mobility  (R26.89)     Time: [id number] PT Time Calculation (min) (ACUTE ONLY): 26 min  Charges:  $Gait Training: 8-22 mins $Therapeutic Activity: 8-22 mins                     03/19/2021  Ginger Carne., PT Acute Rehabilitation Services [phone]  (pager) [phone]  (office)   Phillip Young 03/19/2021, 3:43 PM

## 2021-03-19 NOTE — Progress Notes (Signed)
[facility] NEPHROLOGY PROGRESS NOTE  Assessment/ Plan:  # Dialysis dependent acute kidney Injury: Secondary to cardiogenic shock/hemodynamically mediated in the setting of atrial flutter with RVR and underlying severe biventricular congestive heart failure.  Previously he failed to transition to intermittent hemodialysis before Resumed CRRT on 8/19 - 8/24 Reattempted iHD starting 8/24: 4h, TDC, 2K, 4L UF goal, no heparin, Na 134 If he cannot tolerate intermittent hemodialysis off pressors and inotropes then plan to transition to palliative/hospice care.    # Hyponatremia: Secondary to no GFR, CHF with poorly restricted free water intake.  Again, reminded again about restricting fluid intake.  Changed to 0.5 L.  Lower Na bath  #Hypotension/shock: With heart failure.  Currently on dobutamine, Levophed and midodrine 15 mg 3 times daily.  Monitor blood pressure.  # Heparin-induced thrombocytopenia: Transitioned to bivalirudin from heparin after positive HIT panel on 8/11.  # Biventricular congestive heart failure:  Not a candidate for advanced therapies due to morbid obesity.  Volume management with RRT.  Heart failure team is following.  #  Atrial flutter with RVR: DCCV 8/22  #CKD-BMD: Phosphorus and calcium at target, CTM  Subjective:  Tolerated dialysis yesterday, achieved 2.5 L UF Weight increased by 8 pounds overnight and serum sodium went from 128->123 Patient continues to claim minimizing fluid intake Remains on norepinephrine and dobutamine CVP this morning is 12  Objective Vital signs in last 24 hours: Vitals:   03/19/21 0700 03/19/21 0800 03/19/21 0900 03/19/21 1000  BP: (!) 89/50 (!) 141/84 (!) 84/55 (!) 81/53  Pulse: 86 89 85 87  Resp: 20 18 (!) 21 18  Temp:      TempSrc:      SpO2: (!) 89% 98% 93% 98%  Weight:      Height:       Weight change: 0 kg  Intake/Output Summary (Last 24 hours) at 03/19/2021 1056 Last data filed at 03/19/2021 0805 Gross  per 24 hour  Intake 2530.3 ml  Output 2500 ml  Net 30.3 ml        Labs: Basic Metabolic Panel: Recent Labs  Lab 03/18/21 0439 03/18/21 1655 03/19/21 0454  NA 123* 128* 123*  K 5.1 4.0 4.7  CL 89* 91* 86*  CO2 '23 26 24  '$ GLUCOSE 132* 128* 140*  BUN 26* 17 24*  CREATININE 4.88* 3.20* 5.15*  CALCIUM 9.3 9.0 9.4  PHOS 3.5 1.9* 3.0    Liver Function Tests: Recent Labs  Lab 03/18/21 0439 03/18/21 1655 03/19/21 0454  ALBUMIN 3.0* 3.1* 2.8*    No results for input(s): LIPASE, AMYLASE in the last 168 hours. No results for input(s): AMMONIA in the last 168 hours. CBC: Recent Labs  Lab 03/15/21 0430 03/16/21 0408 03/17/21 0449 03/18/21 0439 03/19/21 0454  WBC 9.7 10.7* 10.4 10.7* 9.3  HGB 8.7* 9.0* 9.6* 9.4* 8.8*  HCT 28.8* 30.4* 31.5* 30.1* 28.1*  MCV 97.3 98.7 97.5 96.2 94.6  PLT 255 256 258 270 216    Cardiac Enzymes: No results for input(s): CKTOTAL, CKMB, CKMBINDEX, TROPONINI in the last 168 hours. CBG: Recent Labs  Lab 03/18/21 0637 03/18/21 1007 03/18/21 1522 03/18/21 2127 03/19/21 0606  GLUCAP 126* 100* 122* 195* 96     Iron Studies: No results for input(s): IRON, TIBC, TRANSFERRIN, FERRITIN in the last 72 hours. Studies/Results: No results found.  Medications: Infusions:   prismasol BGK 4/2.5 500 mL/hr at 03/17/21 0650    prismasol BGK 4/2.5 300 mL/hr at 03/16/21 1701   sodium chloride Stopped (  03/14/21 1254)   anticoagulant sodium citrate     DOBUTamine 3 mcg/kg/min (03/19/21 0600)   norepinephrine (LEVOPHED) Adult infusion 6 mcg/min (03/19/21 0934)   prismasol BGK 4/2.5 2,000 mL/hr at 03/17/21 [id number]    Scheduled Medications:  amiodarone  400 mg Oral BID   apixaban  5 mg Oral BID   Chlorhexidine Gluconate Cloth  6 each Topical Q0600   insulin aspart  0-5 Units Subcutaneous QHS   insulin aspart  0-9 Units Subcutaneous TID WC   mouth rinse  15 mL Mouth Rinse BID   midodrine  15 mg Oral TID WC   multivitamin  1 tablet Oral QHS    polyethylene glycol  17 g Oral Daily   senna-docusate  2 tablet Oral BID   sodium chloride flush  10-40 mL Intracatheter Q12H   sodium chloride flush  3 mL Intravenous Q12H    have reviewed scheduled and prn medications.  Physical Exam: General: Morbidly obese male, not in distress.  Lying in bed Heart:RRR, s1s2 nl Lungs: Distant and coarse breath sound Abdomen:soft, obese, nontender Extremities: Trace LEE, centrally located Dialysis Access: Right IJ TDC.  Phillip Young 03/19/2021,10:56 AM  LOS: 36 days

## 2021-03-20 DIAGNOSIS — I5023 Acute on chronic systolic (congestive) heart failure: Secondary | ICD-10-CM | POA: Diagnosis not present

## 2021-03-20 DIAGNOSIS — R570 Cardiogenic shock: Secondary | ICD-10-CM | POA: Diagnosis not present

## 2021-03-20 LAB — CBC
HCT: 28.2 % — ABNORMAL LOW (ref 39.0–52.0)
Hemoglobin: 9 g/dL — ABNORMAL LOW (ref 13.0–17.0)
MCH: 29.8 pg (ref 26.0–34.0)
MCHC: 31.9 g/dL (ref 30.0–36.0)
MCV: 93.4 fL (ref 80.0–100.0)
Platelets: 220 10*3/uL (ref 150–400)
RBC: 3.02 MIL/uL — ABNORMAL LOW (ref 4.22–5.81)
RDW: 18.1 % — ABNORMAL HIGH (ref 11.5–15.5)
WBC: 9.8 10*3/uL (ref 4.0–10.5)
nRBC: 0 % (ref 0.0–0.2)

## 2021-03-20 LAB — RENAL FUNCTION PANEL
Albumin: 2.8 g/dL — ABNORMAL LOW (ref 3.5–5.0)
Albumin: 2.7 g/dL — ABNORMAL LOW (ref 3.5–5.0)
Anion gap: 13 (ref 5–15)
Anion gap: 11 (ref 5–15)
BUN: 42 mg/dL — ABNORMAL HIGH (ref 6–20)
BUN: 50 mg/dL — ABNORMAL HIGH (ref 6–20)
CO2: 20 mmol/L — ABNORMAL LOW (ref 22–32)
CO2: 22 mmol/L (ref 22–32)
Calcium: 8.9 mg/dL (ref 8.9–10.3)
Calcium: 9.2 mg/dL (ref 8.9–10.3)
Chloride: 90 mmol/L — ABNORMAL LOW (ref 98–111)
Chloride: 92 mmol/L — ABNORMAL LOW (ref 98–111)
Creatinine, Ser: 7.46 mg/dL — ABNORMAL HIGH (ref 0.61–1.24)
Creatinine, Ser: 8.37 mg/dL — ABNORMAL HIGH (ref 0.61–1.24)
GFR, Estimated: 9 mL/min — ABNORMAL LOW (ref >60)
GFR, Estimated: 8 mL/min — ABNORMAL LOW (ref >60)
Glucose, Bld: 103 mg/dL — ABNORMAL HIGH (ref 70–99)
Glucose, Bld: 112 mg/dL — ABNORMAL HIGH (ref 70–99)
Phosphorus: 3.7 mg/dL (ref 2.5–4.6)
Phosphorus: 3.2 mg/dL (ref 2.5–4.6)
Potassium: 4.8 mmol/L (ref 3.5–5.1)
Potassium: 4.8 mmol/L (ref 3.5–5.1)
Sodium: 123 mmol/L — ABNORMAL LOW (ref 135–145)
Sodium: 125 mmol/L — ABNORMAL LOW (ref 135–145)

## 2021-03-20 LAB — GLUCOSE, CAPILLARY
Glucose-Capillary: 120 mg/dL — ABNORMAL HIGH (ref 70–99)
Glucose-Capillary: 118 mg/dL — ABNORMAL HIGH (ref 70–99)
Glucose-Capillary: 89 mg/dL (ref 70–99)
Glucose-Capillary: 79 mg/dL (ref 70–99)

## 2021-03-20 LAB — MAGNESIUM: Magnesium: 2.1 mg/dL (ref 1.7–2.4)

## 2021-03-20 MED ORDER — ALTEPLASE 2 MG IJ SOLR
2.0000 mg | Freq: Once | INTRAMUSCULAR | Status: AC
  Administered 2021-03-20: 2 mg
  Filled 2021-03-20: qty 2

## 2021-03-20 MED ORDER — SODIUM CHLORIDE 0.9 % IV SOLN: 100.0000 mL | INTRAVENOUS | Status: DC | PRN

## 2021-03-20 MED ORDER — ALTEPLASE 2 MG IJ SOLR: 2.0000 mg | Freq: Once | INTRAMUSCULAR | Status: DC | PRN

## 2021-03-20 MED ORDER — HEPARIN SODIUM (PORCINE) 1000 UNIT/ML IJ SOLN
INTRAMUSCULAR | Status: AC
  Filled 2021-03-20: qty 4

## 2021-03-20 NOTE — Progress Notes (Signed)
RT spoke with pt about wearing CPAP overnight. Pt stated he wasn't ready to wear, would place himself when ready for bed. Pt knows to call for RT if assistance is needed.

## 2021-03-20 NOTE — Progress Notes (Signed)
Brief Nutrition follow-up:  Noted pt now on 500 mL fluid restriction per Nephrology. RD reached out to MD regarding plan.   Pt is not thrilled by fluid restriction but both MD and RN have discussed the new restriction with pt.  Pt has been non-compliant with 1200 mL fluid restriction and will likely continue to be non-compliant with 500 mL fluid restriction. Breakfast tray this AM with 2-8 ounce cartons of milk, Jell-O, and 2 canned peaches which already exceeds 500 mL restriction. MD wanting pt to be on 500 mL restriction due to persistent hyponatremia increasing risk with iHD. While this strict of a fluid restriction can be utilized in the very short term, it is not a reasonable expectation for patient at discharge.   Spoke with Manager in [employer] regarding fluid restriction and pt is not to receive ANY fluids on meal trays. This includes drinks, jello, popsicles, ice cream, canned fruit (pt drinks the juice from the canned fruit and orders multiple on meal trays), soup/broth. [place] for pt to have pudding and yogurt. RN will provide all fluids to patient.   Kerman Passey MS, RDN, LDN, CNSC Registered Dietitian III Clinical Nutrition RD Pager and On-Call Pager Number Located in [place]

## 2021-03-20 NOTE — Progress Notes (Signed)
[facility] NEPHROLOGY PROGRESS NOTE  Assessment/ Plan:  # Dialysis dependent acute kidney Injury: Secondary to cardiogenic shock/hemodynamically mediated in the setting of atrial flutter with RVR and underlying severe biventricular congestive heart failure.  Previously he failed to transition to intermittent hemodialysis before Resumed CRRT on 8/19 - 8/24 Reattempted iHD starting 8/24: 4h, TDC, 2K, 4L UF goal, no heparin, Na 134; will be receiving dialysis again today If he cannot tolerate intermittent hemodialysis off pressors and inotropes then plan to transition to palliative/hospice care.    # Hyponatremia: Secondary to no GFR, CHF with poorly restricted free water intake.  Again, reminded again about restricting fluid intake.  Continue 0.5 L fluid restriction.  Rationale discussed with patient.  Dialysis in the setting of hyponatremia has increased risk.  Lower Na bath 134  #Hypotension / Prolonged cardiogenic shock.  Remains on dobutamine, Levophed and midodrine 15 mg 3 times daily.  Per AHF  # HIT: Transitioned to bivalirudin from heparin after positive HIT panel on 8/11.  # Biventricular congestive heart failure:  Not a candidate for advanced therapies due to morbid obesity.  Volume management with RRT.  Heart failure team is following.  #  Atrial flutter with RVR: DCCV 8/22  #CKD-BMD: Phosphorus and calcium at target, CTM  Subjective:  No interval events Remains on low-dose norepinephrine, on the dobutamine No reported urine output Frustrated about 0.5 L fluid restriction Serum sodium 123 this morning   Objective Vital signs in last 24 hours: Vitals:   03/20/21 0600 03/20/21 0630 03/20/21 0700 03/20/21 0800  BP: 112/69 100/60 114/65   Pulse: 83 82 85   Resp: 19 (!) 22 (!) 24   Temp: 99.2 F (37.3 C)     TempSrc: Oral     SpO2: 100% 99% 98% 92%  Weight:      Height:       Weight change: -1 kg  Intake/Output Summary (Last 24 hours) at 03/20/2021  1056 Last data filed at 03/20/2021 0939 Gross per 24 hour  Intake 1617.13 ml  Output --  Net 1617.13 ml        Labs: Basic Metabolic Panel: Recent Labs  Lab 03/19/21 0454 03/19/21 1747 03/20/21 0012  NA 123* 124* 123*  K 4.7 5.0 4.8  CL 86* 90* 90*  CO2 24 21* 20*  GLUCOSE 140* 108* 103*  BUN 24* 37* 42*  CREATININE 5.15* 6.54* 7.46*  CALCIUM 9.4 9.0 8.9  PHOS 3.0 3.1 3.7    Liver Function Tests: Recent Labs  Lab 03/19/21 0454 03/19/21 1747 03/20/21 0012  ALBUMIN 2.8* 2.9* 2.8*    No results for input(s): LIPASE, AMYLASE in the last 168 hours. No results for input(s): AMMONIA in the last 168 hours. CBC: Recent Labs  Lab 03/16/21 0408 03/17/21 0449 03/18/21 0439 03/19/21 0454 03/20/21 0012  WBC 10.7* 10.4 10.7* 9.3 9.8  HGB 9.0* 9.6* 9.4* 8.8* 9.0*  HCT 30.4* 31.5* 30.1* 28.1* 28.2*  MCV 98.7 97.5 96.2 94.6 93.4  PLT 256 258 270 216 220    Cardiac Enzymes: No results for input(s): CKTOTAL, CKMB, CKMBINDEX, TROPONINI in the last 168 hours. CBG: Recent Labs  Lab 03/19/21 0606 03/19/21 1110 03/19/21 1636 03/19/21 2241 03/20/21 0614  GLUCAP 96 135* 120* 183* 118*     Iron Studies: No results for input(s): IRON, TIBC, TRANSFERRIN, FERRITIN in the last 72 hours. Studies/Results: No results found.  Medications: Infusions:   prismasol BGK 4/2.5 500 mL/hr at 03/17/21 0650    prismasol BGK 4/2.5  300 mL/hr at 03/16/21 1701   sodium chloride Stopped (03/14/21 1254)   anticoagulant sodium citrate     DOBUTamine 2 mcg/kg/min (03/20/21 0738)   norepinephrine (LEVOPHED) Adult infusion 4 mcg/min (03/20/21 0738)   prismasol BGK 4/2.5 2,000 mL/hr at 03/17/21 [id number]    Scheduled Medications:  amiodarone  400 mg Oral BID   apixaban  5 mg Oral BID   Chlorhexidine Gluconate Cloth  6 each Topical Q0600   insulin aspart  0-5 Units Subcutaneous QHS   insulin aspart  0-9 Units Subcutaneous TID WC   mouth rinse  15 mL Mouth Rinse BID   midodrine  15 mg Oral  TID WC   multivitamin  1 tablet Oral QHS   polyethylene glycol  17 g Oral Daily   senna-docusate  2 tablet Oral BID   sodium chloride flush  10-40 mL Intracatheter Q12H   sodium chloride flush  3 mL Intravenous Q12H    have reviewed scheduled and prn medications.  Physical Exam: General: Morbidly obese male, not in distress.  Lying in bed Heart:RRR, s1s2 nl Lungs: Distant and coarse breath sound Abdomen:soft, obese, nontender Extremities: Trace LEE, centrally located Dialysis Access: Right IJ TDC.  Samreen Seltzer B Aldrick Derrig 03/20/2021,10:56 AM  LOS: 37 days

## 2021-03-20 NOTE — Plan of Care (Signed)
Problem: Health Behavior/Discharge Planning: Goal: Ability to manage health-related needs will improve Outcome: Not Progressing   Problem: Clinical Measurements: Goal: Ability to maintain clinical measurements within normal limits will improve Outcome: Not Progressing   Problem: Education: Goal: Knowledge of disease and its progression will improve Outcome: Not Progressing   Problem: Health Behavior/Discharge Planning: Goal: Ability to manage health-related needs will improve Outcome: Not Progressing   Problem: Fluid Volume: Goal: Fluid volume balance will be maintained or improved Outcome: Not Progressing   Problem: Nutritional: Goal: Ability to make appropriate dietary choices will improve Outcome: Not Progressing

## 2021-03-20 NOTE — Progress Notes (Signed)
[name] arrived to unit with restaurant prepared food brought in for the patient. Total of 3 bags. Food consists of BBQ (chopped), BBQ chicken, macaroni and cheese, sweet potato casserole, banana pudding, rolls, and a 20oz bottle of water. The third bag was not inspected as it was brown paper and placed in a tied plastic bag. Patient became extremely agitated and threatened staff (foul language). Patient also threatened to leave AMA, and said he wanted all of this to be over. This nurse tried to educate the patient and family over the quantity and quality of food. (Patient on a 2g Sodium diet with 576m fluid restriction). Patient unsuccessfully educated and MD and APP paged.  Both arrived at bedside. See care order instructions.    SKathleene HazelRN

## 2021-03-20 NOTE — Progress Notes (Signed)
Patient ID: Phillip Young, male   DOB: 21-Mar-1986, 35 y.o.   MRN: [id number]      Advanced Heart Failure Rounding Note  PCP-Cardiologist: Dr. [name]  Subjective:    Remains on dual inotropes w/ dobutamine at 3 and NE at 5.  Co-oximetry pending this morning -- tPA instilling through port.  CVP 16-18, but unclear if accurate due to issues with line.   Last iHD on 8/24 with 2.5L removed.    Charted weight is four pounds lower, but PO intake continues to be a challenge.  He is +1743 overall this morning.   Maintaining NSR on amio gtt. HR 80s.   He is lying in bed in no acute distress, but upset about his fluid restriction of 500 mL/day ordered by nephrology.   Objective:   Weight Range: (!) 187 kg Body mass index is 57.5 kg/m.   Vital Signs:   Temp:  [97.8 F (36.6 C)-99.2 F (37.3 C)] 99.2 F (37.3 C) (08/26 0600) Pulse Rate:  [80-93] 82 (08/26 0630) Resp:  [16-30] 22 (08/26 0630) BP: (81-141)/(43-96) 100/60 (08/26 0630) SpO2:  [82 %-100 %] 99 % (08/26 0630) Weight:  [187 kg] 187 kg (08/26 0500) Last BM Date: 03/18/21  Weight change: Filed Weights   03/18/21 1700 03/19/21 0455 03/20/21 0500  Weight: (!) 185.5 kg (!) 188.7 kg (!) 187 kg    Intake/Output:   Intake/Output Summary (Last 24 hours) at 03/20/2021 0659 Last data filed at 03/20/2021 0400 Gross per 24 hour  Intake 1743.08 ml  Output --  Net 1743.08 ml      Physical Exam   General:  Well appearing, obese. HEENT: RIJ HD catheter  Neck: supple. Thick neck -- JVD not well visualized. Carotids 2+ bilat; no bruits. No lymphadenopathy or thyromegaly appreciated. Cor: PMI nondisplaced. Regular rate & rhythm. No rubs, gallops or murmurs. Lungs: clear Abdomen: obese, soft, nontender, nondistended. No hepatosplenomegaly. No bruits or masses. Good bowel sounds. Extremities: no cyanosis, clubbing, rash, edema Neuro: alert & oriented x 3, cranial nerves grossly intact. moves all 4 extremities w/o difficulty. Affect  pleasant.  Telemetry   NSR 80s (personally reviewed)  Labs    CBC Recent Labs    03/19/21 0454 03/20/21 0012  WBC 9.3 9.8  HGB 8.8* 9.0*  HCT 28.1* 28.2*  MCV 94.6 93.4  PLT 216 [id number]    Basic Metabolic Panel Recent Labs    03/19/21 0454 03/19/21 1747 03/20/21 0012  NA 123* 124* 123*  K 4.7 5.0 4.8  CL 86* 90* 90*  CO2 24 21* 20*  GLUCOSE 140* 108* 103*  BUN 24* 37* 42*  CREATININE 5.15* 6.54* 7.46*  CALCIUM 9.4 9.0 8.9  MG 2.2  --  2.1  PHOS 3.0 3.1 3.7    Liver Function Tests Recent Labs    03/19/21 1747 03/20/21 0012  ALBUMIN 2.9* 2.8*     No results for input(s): LIPASE, AMYLASE in the last 72 hours. Cardiac Enzymes No results for input(s): CKTOTAL, CKMB, CKMBINDEX, TROPONINI in the last 72 hours.  BNP: BNP (last 3 results) Recent Labs    10/10/20 1336 11/07/20 0932 02/11/21 1500  BNP 126.9* 106.0* 194.3*     ProBNP (last 3 results) No results for input(s): PROBNP in the last 8760 hours.   D-Dimer No results for input(s): DDIMER in the last 72 hours.  Hemoglobin A1C No results for input(s): HGBA1C in the last 72 hours.  Fasting Lipid Panel No results for input(s): CHOL, HDL, LDLCALC, TRIG, CHOLHDL, LDLDIRECT  in the last 72 hours. Thyroid Function Tests No results for input(s): TSH, T4TOTAL, T3FREE, THYROIDAB in the last 72 hours.  Invalid input(s): FREET3   Other results:   Imaging    No results found.   Medications:     Scheduled Medications:  amiodarone  400 mg Oral BID   apixaban  5 mg Oral BID   Chlorhexidine Gluconate Cloth  6 each Topical Q0600   insulin aspart  0-5 Units Subcutaneous QHS   insulin aspart  0-9 Units Subcutaneous TID WC   mouth rinse  15 mL Mouth Rinse BID   midodrine  15 mg Oral TID WC   multivitamin  1 tablet Oral QHS   polyethylene glycol  17 g Oral Daily   senna-docusate  2 tablet Oral BID   sodium chloride flush  10-40 mL Intracatheter Q12H   sodium chloride flush  3 mL Intravenous  Q12H    Infusions:   prismasol BGK 4/2.5 500 mL/hr at 03/17/21 0650    prismasol BGK 4/2.5 300 mL/hr at 03/16/21 1701   sodium chloride Stopped (03/14/21 1254)   anticoagulant sodium citrate     DOBUTamine 3 mcg/kg/min (03/20/21 [id number])   norepinephrine (LEVOPHED) Adult infusion 5 mcg/min (03/20/21 0400)   prismasol BGK 4/2.5 2,000 mL/hr at 03/17/21 0823    PRN Medications: sodium chloride, acetaminophen **OR** acetaminophen, anticoagulant sodium citrate, camphor-menthol, diphenhydrAMINE, Gerhardt's butt cream, guaiFENesin-dextromethorphan, lidocaine, LORazepam, menthol-cetylpyridinium, ondansetron (ZOFRAN) IV, oxyCODONE, sodium chloride, sodium chloride, sodium chloride flush, sodium chloride flush  Assessment/Plan   1. Acute on Chronic Biventricular Heart Failure -> cardiogenic shock:  Known NICM since 2019, previously followed by [facility].  Echo 8/21 EF < 20%, moderate LV dilation, severely decreased RV function, severe biatrial enlargement, mild MR. No history of ETOH/drugs.  No FH of cardiomyopathy (mother with [place]).  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  He is too large for cardiac MRI. RHC/LHC on 03/04/20 with no CAD low CI at 2.1. Evaluated by EP but not a candidate for ICD given BMI.  Echo this admission with EF < 20%, RV poorly Dev eloped cardiogenic shock -> AKI in setting of AFL with RVR. Dobutamine started at 2.5 and increased to 7.5.  NE added to support CVVHD. He was transitioned to [facility] but unable to tolerate this with rising CVP and respiratory distress, inability to comply with fluid restriction.  Restarted CVVH for additional fluid removal, but again transitioned to iHD with last session on 8/24.  CVP 16-18, but unclear if accurate due to issue with line. Decrease NE to 4 and dobutamine to 2 today - iHD transition is going to be tricky with him, not sure that he will be able to manage his volume by iHD. If he fails transition  to iHD again, we are out of options. Not candidate for advanced therapies. This has been explained to him several times. If fails to tolerate iHD off inotopes, he will need hospice.  - Will need to eventually wean off dobutamine and NE, continue midodrine.   - With AKI/hypotension, stopped Entresto, dapagliflozin, spironolactone, digoxin.  - Reinforced importance of fluid restriction  2. Aflutter/fibrillation w/ RVR:  Noted to be in Afib during a prior admission in 3/22 in the setting of CAP and a/c CHF, but converted to NSR on amiodarone. Discharged home w/ eliquis. Was in 2:1 AFL this admit in setting of a/c CHF w/ marked volume overload, + recent gastroenteritis w/ diarrhea. Has OSA but not using CPAP. TSH  WNL. Had DCCV on 8/5 and again 8/22, in NSR today.  - Switch to PO amiodarone (400 mg BID) - Continue Eliquis  3. AKI on CKD IV: Likely ATN/cardiorenal.CVVHD stopped on 8/9 after catheter clotted.  iHD started on 8/11 with 2L of volume removed. Trialysis cath switched 8/12. Tolerated iHD on 8/13 and had dialysis catheter placed 8/16.  On 8/16, CVVH restarted with respiratory distress.    - Transition to iHD on 8/24 with mild improvement in CVP.  Co-ox decreased and weight up, however. Hyponatremia remains an issue.  - As above, 1 more trial of iHD. If cannot tolerate iHD off pressors will be hospice situation 4. Acute Hypoxic Respiratory Failure: Suspect underlying OHS/OSA exacerbated by CHF this admission. Now on nasal cannula.  - Continue oxygen  - Encouraged BiPAP at night - Volume removal with HD 5. Type 2 DM: Farxiga held with AKI.  - continue SSI 6. OSA: Has not been on Bipap at home.  7. Super Morbid Obesity: Body mass index is 57.5 kg/m. 8. UTI: Suspect by UA, sent urine culture.  - Ceftriaxone IV course completed.  9. Hyponatremia: Improved with CVVH but back down again at 123 (127 yesterday) - He has been counseled on free water restriction numerous times. This remains an issue.   10. ID: With fever, started empirically on vancomycin/cefepime.  Cultures NGTD.  CVL removed and PICC placed. He completed 5 days empiric abx.  11. 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 Platelets recovered.  SRA+.  - now back on Eliquis    Darcella Gasman, NP  03/20/2021 6:59 AM  Patient seen with NP, agree with the above note.    Coughing this morning, CVP 16-18. Unable to get co-ox off line this morning. Last HD was 8/24.  Na low but stable at 123. He remains in NSR.    General: NAD Neck: JVP 16 cm, no thyromegaly or thyroid nodule.  Lungs: Clear to auscultation bilaterally with normal respiratory effort. CV: Nondisplaced PMI.  Heart regular S1/S2, no S3/S4, no murmur.  Trace ankle edema.  No carotid bruit.  Normal pedal pulses.  Abdomen: Soft, nontender, no hepatosplenomegaly, no distention.  Skin: Intact without lesions or rashes.  Neurologic: Alert and oriented x 3.  Psych: Normal affect. Extremities: No clubbing or cyanosis.  HEENT: Normal.   Patient seems to tolerate iHD on current pressors, but unclear that he will be able to limit volume intake between sessions.  CVP trending up. Plan HD today, he will almost certainly require 4 days/week if iHD is successful.     Continue slowly weaning vasoactive meds.  Decrease NE to 4 this morning and dobutamine to 2.  Accept SBP > 85 as long as he is not lightheaded. Continue midodrine 15 mg tid.    Maintaining NSR on amiodarone and apixaban.     Must limit fluid intake given low sodium.    Ambulate.  CRITICAL CARE Performed by: [name]  Total critical care time: 35 minutes  Critical care time was exclusive of separately billable procedures and treating other patients.  Critical care was necessary to treat or prevent imminent or life-threatening deterioration.  Critical care was time spent personally by me on the following activities: development of treatment plan  with patient and/or surrogate as well as nursing, discussions with consultants, evaluation of patient's response to treatment, examination of patient, obtaining history from patient or surrogate, ordering and performing treatments and interventions, ordering and review of laboratory  studies, ordering and review of radiographic studies, pulse oximetry and re-evaluation of patient's condition.  [name] 03/20/2021 7:38 AM

## 2021-03-20 NOTE — Progress Notes (Addendum)
Called to bedside to address patient's frustration over food intake.  His mother brought him a plate of food from a [employer] that consisted of beef brisket, chicken, string beans and macaroni and cheese.  RN did not permit him to eat the food because of his dietary sodium restriction.  The patient became upset and argumentative.  Explained to him that his options for recovery are limited and in order to maximize the possibility of surviving this hospital stay, we as a team need to be extremely diligent and selective about his intake in order to facilitate volume removal.  I stressed that I absolutely understand his frustration, but unfortunately, we are very limited.   He expressed to his mother and staff that he's "ready to give up" and feels the limitations are "torture."    Dr [name] came to bedside and reiterated the above.  He may not have restaurant food at this time.

## 2021-03-20 NOTE — Progress Notes (Signed)
Pt and family continue to refuse education and adhere to the 568m restriction. Family continues to bring in drinks and food.  Pt refuses to have BP taken q140ms. Pt refuses to have BG taken.  Pt refuses to have bed alarm on and was educated on the risk of falls and injury.   Nurse contacted NP, Xe to let the team know of the patients condition and his refusal of care. Nurse continues to educate patient, and pt is connected to monitor.

## 2021-03-21 DIAGNOSIS — I5023 Acute on chronic systolic (congestive) heart failure: Secondary | ICD-10-CM | POA: Diagnosis not present

## 2021-03-21 LAB — CBC
HCT: 29 % — ABNORMAL LOW (ref 39.0–52.0)
Hemoglobin: 9.3 g/dL — ABNORMAL LOW (ref 13.0–17.0)
MCH: 30.1 pg (ref 26.0–34.0)
MCHC: 32.1 g/dL (ref 30.0–36.0)
MCV: 93.9 fL (ref 80.0–100.0)
Platelets: 250 10*3/uL (ref 150–400)
RBC: 3.09 MIL/uL — ABNORMAL LOW (ref 4.22–5.81)
RDW: 18.6 % — ABNORMAL HIGH (ref 11.5–15.5)
WBC: 10.2 10*3/uL (ref 4.0–10.5)
nRBC: 0.4 % — ABNORMAL HIGH (ref 0.0–0.2)

## 2021-03-21 LAB — GLUCOSE, CAPILLARY
Glucose-Capillary: 108 mg/dL — ABNORMAL HIGH (ref 70–99)
Glucose-Capillary: 164 mg/dL — ABNORMAL HIGH (ref 70–99)

## 2021-03-21 LAB — RENAL FUNCTION PANEL
Albumin: 3 g/dL — ABNORMAL LOW (ref 3.5–5.0)
Albumin: 2.9 g/dL — ABNORMAL LOW (ref 3.5–5.0)
Anion gap: 11 (ref 5–15)
Anion gap: 11 (ref 5–15)
BUN: 32 mg/dL — ABNORMAL HIGH (ref 6–20)
BUN: 38 mg/dL — ABNORMAL HIGH (ref 6–20)
CO2: 24 mmol/L (ref 22–32)
CO2: 25 mmol/L (ref 22–32)
Calcium: 9.3 mg/dL (ref 8.9–10.3)
Calcium: 9.2 mg/dL (ref 8.9–10.3)
Chloride: 91 mmol/L — ABNORMAL LOW (ref 98–111)
Chloride: 91 mmol/L — ABNORMAL LOW (ref 98–111)
Creatinine, Ser: 6.69 mg/dL — ABNORMAL HIGH (ref 0.61–1.24)
Creatinine, Ser: 8.15 mg/dL — ABNORMAL HIGH (ref 0.61–1.24)
GFR, Estimated: 10 mL/min — ABNORMAL LOW (ref >60)
GFR, Estimated: 8 mL/min — ABNORMAL LOW (ref >60)
Glucose, Bld: 129 mg/dL — ABNORMAL HIGH (ref 70–99)
Glucose, Bld: 113 mg/dL — ABNORMAL HIGH (ref 70–99)
Phosphorus: 3.7 mg/dL (ref 2.5–4.6)
Phosphorus: 4.8 mg/dL — ABNORMAL HIGH (ref 2.5–4.6)
Potassium: 4.5 mmol/L (ref 3.5–5.1)
Potassium: 4.7 mmol/L (ref 3.5–5.1)
Sodium: 126 mmol/L — ABNORMAL LOW (ref 135–145)
Sodium: 127 mmol/L — ABNORMAL LOW (ref 135–145)

## 2021-03-21 LAB — COOXEMETRY PANEL
Carboxyhemoglobin: 1.6 % — ABNORMAL HIGH (ref 0.5–1.5)
Methemoglobin: 1 % (ref 0.0–1.5)
O2 Saturation: 65 %
Total hemoglobin: 9.2 g/dL — ABNORMAL LOW (ref 12.0–16.0)

## 2021-03-21 LAB — MAGNESIUM: Magnesium: 2.2 mg/dL (ref 1.7–2.4)

## 2021-03-21 MED ORDER — FUROSEMIDE 10 MG/ML IJ SOLN
160.0000 mg | Freq: Two times a day (BID) | INTRAVENOUS | Status: AC
  Administered 2021-03-21 (×2): 160 mg via INTRAVENOUS
  Filled 2021-03-21: qty 10
  Filled 2021-03-21 (×2): qty 16

## 2021-03-21 NOTE — Plan of Care (Signed)
  Problem: Education: Goal: Knowledge of General Education information will improve Description: Including pain rating scale, medication(s)/side effects and non-pharmacologic comfort measures Outcome: Not Progressing   Problem: Health Behavior/Discharge Planning: Goal: Ability to manage health-related needs will improve Outcome: Not Progressing   Problem: Clinical Measurements: Goal: Ability to maintain clinical measurements within normal limits will improve Outcome: Not Progressing Goal: Will remain free from infection Outcome: Not Progressing Goal: Diagnostic test results will improve Outcome: Not Progressing Goal: Respiratory complications will improve Outcome: Not Progressing Goal: Cardiovascular complication will be avoided Outcome: Not Progressing   Problem: Activity: Goal: Risk for activity intolerance will decrease Outcome: Not Progressing   Problem: Coping: Goal: Level of anxiety will decrease Outcome: Not Progressing   Problem: Elimination: Goal: Will not experience complications related to bowel motility Outcome: Not Progressing   Problem: Pain Managment: Goal: General experience of comfort will improve Outcome: Not Progressing   Problem: Safety: Goal: Ability to remain free from injury will improve Outcome: Not Progressing   Problem: Activity: Goal: Activity intolerance will improve Outcome: Not Progressing   Problem: Clinical Measurements: Goal: Complications related to the disease process or treatment will be avoided or minimized Outcome: Not Progressing Goal: Dialysis access will remain free of complications Outcome: Not Progressing   Problem: Health Behavior/Discharge Planning: Goal: Ability to manage health-related needs will improve Outcome: Not Progressing   Problem: Fluid Volume: Goal: Fluid volume balance will be maintained or improved Outcome: Not Progressing

## 2021-03-21 NOTE — Progress Notes (Addendum)
Pt requests to see Charge Nurse for undisclosed reason, Charge Nurse comes to the bedside.  Pt is immediately verbally aggressive with Charge, is unsatisfied with answers to his questions regarding medication routes and plan of care. Pt claims IV Lasix, "Is what ruined my kidneys to start with," and that he will only take, "pill form," Lasix.  Pt claims that he will, "really refuse care now," and has removed his heart monitor leads.  Pt requests apple juice for his oral medication.  Pt in the chair, resp heavy and labored, sats in the 90s, no EKG present, continuing to monitor and assess.  Will ask pharmacy for oral '160mg'$  Lasix  ** **Update: Per Dr Haroldine Laws, if pt refuses heart monitor or medication, then Palliative Care will become the main Provider Team for this pt and he can transfer.  This information was given to the pt with Charge Nurse present, pt expressed interest in talking with PC to further understand their role as care providers, pt consents to IV Lasix and pt consents to wearing the heart monitor.  Pt back in bed, monitor in place, IV Lasix being administered, all to the current satisfaction of pt.  Continuing to monitor and assess.

## 2021-03-21 NOTE — Progress Notes (Signed)
[facility] NEPHROLOGY PROGRESS NOTE  Assessment/ Plan:  # Dialysis dependent acute kidney Injury: Secondary to cardiogenic shock/hemodynamically mediated in the setting of atrial flutter with RVR and underlying severe biventricular congestive heart failure.  Previously he failed to transition to intermittent hemodialysis before Resumed CRRT on 8/19 - 8/24 Reattempted iHD starting 8/24: 4h, TDC, 2K, 4L UF goal, no heparin, Na 134; has tolerated first 2 Tx while on inotrop and pressor Trial o flasix high dose to day to augment UOP Assess HD need tomorrow  # Hyponatremia: Secondary to no GFR, CHF with poorly restricted free water intake.  Continue 0.5 L fluid restriction.  Rationale discussed with patient.  Dialysis in the setting of hyponatremia has increased risk.  Using lower Na bath 134  #Hypotension / Prolonged cardiogenic shock.  Weening, on dobutamine, Levophed and midodrine 15 mg 3 times daily.  Per AHF  # HIT: Transitioned to bivalirudin from heparin after positive HIT panel on 8/11.  # Biventricular congestive heart failure:  Not a candidate for advanced therapies due to morbid obesity.  Volume management with RRT.  Heart failure team is following.  #  Atrial flutter with RVR: DCCV 8/22  #CKD-BMD: Phosphorus and calcium at target, CTM  Subjective:  Is not adherent to diet/fluid restriciton HD yesterday 4L UF Weening Dobut and NE 0.3L UOP  CVP 7 this AM SNa 126 this AM  Objective Vital signs in last 24 hours: Vitals:   03/21/21 0830 03/21/21 0900 03/21/21 0930 03/21/21 1000  BP:      Pulse: 96 85 91 (!) 101  Resp: (!) '25 17 20 18  '$ Temp:      TempSrc:      SpO2: 100% 97% 96% 95%  Weight:      Height:       Weight change: 5.2 kg  Intake/Output Summary (Last 24 hours) at 03/21/2021 1108 Last data filed at 03/21/2021 [id number] Gross per 24 hour  Intake 542.51 ml  Output 4300 ml  Net -3757.49 ml        Labs: Basic Metabolic Panel: Recent Labs   Lab 03/20/21 0012 03/20/21 1600 03/21/21 0401  NA 123* 125* 126*  K 4.8 4.8 4.5  CL 90* 92* 91*  CO2 20* 22 24  GLUCOSE 103* 112* 129*  BUN 42* 50* 32*  CREATININE 7.46* 8.37* 6.69*  CALCIUM 8.9 9.2 9.3  PHOS 3.7 3.2 3.7    Liver Function Tests: Recent Labs  Lab 03/20/21 0012 03/20/21 1600 03/21/21 0401  ALBUMIN 2.8* 2.7* 3.0*    No results for input(s): LIPASE, AMYLASE in the last 168 hours. No results for input(s): AMMONIA in the last 168 hours. CBC: Recent Labs  Lab 03/17/21 0449 03/18/21 0439 03/19/21 0454 03/20/21 0012 03/21/21 0401  WBC 10.4 10.7* 9.3 9.8 10.2  HGB 9.6* 9.4* 8.8* 9.0* 9.3*  HCT 31.5* 30.1* 28.1* 28.2* 29.0*  MCV 97.5 96.2 94.6 93.4 93.9  PLT 258 270 216 220 250    Cardiac Enzymes: No results for input(s): CKTOTAL, CKMB, CKMBINDEX, TROPONINI in the last 168 hours. CBG: Recent Labs  Lab 03/19/21 1636 03/19/21 2241 03/20/21 0614 03/20/21 1112 03/20/21 1639  GLUCAP 120* 183* 118* 89 79     Iron Studies: No results for input(s): IRON, TIBC, TRANSFERRIN, FERRITIN in the last 72 hours. Studies/Results: No results found.  Medications: Infusions:  sodium chloride Stopped (03/14/21 1254)   sodium chloride     sodium chloride     sodium chloride     anticoagulant  sodium citrate     DOBUTamine 1 mcg/kg/min (03/21/21 0945)   furosemide 160 mg (03/21/21 0921)   norepinephrine (LEVOPHED) Adult infusion 2 mcg/min (03/21/21 0946)    Scheduled Medications:  amiodarone  400 mg Oral BID   apixaban  5 mg Oral BID   Chlorhexidine Gluconate Cloth  6 each Topical Q0600   insulin aspart  0-5 Units Subcutaneous QHS   insulin aspart  0-9 Units Subcutaneous TID WC   midodrine  15 mg Oral TID WC   multivitamin  1 tablet Oral QHS   polyethylene glycol  17 g Oral Daily   senna-docusate  2 tablet Oral BID   sodium chloride flush  10-40 mL Intracatheter Q12H    have reviewed scheduled and prn medications.  Physical Exam: General: Morbidly  obese male, not in distress.  Lying in bed Heart:RRR, s1s2 nl Lungs: Distant and coarse breath sound Abdomen:soft, obese, nontender Extremities: Trace LEE, centrally located Dialysis Access: Right IJ TDC.  Phillip Young Phillip Young 03/21/2021,11:08 AM  LOS: 38 days

## 2021-03-21 NOTE — Progress Notes (Signed)
After lunch pt has been sleeping in bed, monitor attached, O2 in place.  Pt periodically removes [place] and desats into the low 80s and is needed to be reminded to place both prongs of his [place] correctly in his nose.  Pt displays s/s of OSA while napping.  Pt refusing to wear the CPAP unless it is night time.  PT rouses and is A/Ox4, VSS when awake and breathing adequately. Will continue to adjust O2 flow and monitor and assess.

## 2021-03-21 NOTE — Progress Notes (Addendum)
Patient ID: Phillip Young, male   DOB: [birth year], 35 y.o.   MRN: [id number]      Advanced Heart Failure Rounding Note  PCP-Cardiologist: Dr. [name]  Subjective:    Remains on dual inotropes w/ dobutamine at 2 and NE at 4. Dobutamine turned down yesterday. Co-ox 65%  Had HD yesterday with 4L off. Continues to refuse to adhere to fluid restriction or comply with many other RN requests. CVP 12  Maintaining NSR on amio gtt. HR 80s.   Denies SOB, orthopnea or PND. Wants to go home.   Objective:   Weight Range: (!) 186.3 kg Body mass index is 57.28 kg/m.   Vital Signs:   Temp:  [98 F (36.7 C)-98.2 F (36.8 C)] 98.2 F (36.8 C) (08/27 0000) Pulse Rate:  [79-92] 90 (08/27 0600) Resp:  [15-30] 23 (08/27 0600) BP: (72-147)/(26-133) 88/37 (08/27 0500) SpO2:  [83 %-100 %] 99 % (08/27 0600) Weight:  [186.3 kg-192.2 kg] 186.3 kg (08/27 0500) Last BM Date: 03/20/21  Weight change: Filed Weights   03/20/21 1615 03/20/21 2031 03/21/21 0500  Weight: (!) 192.2 kg (!) 188.2 kg (!) 186.3 kg    Intake/Output:   Intake/Output Summary (Last 24 hours) at 03/21/2021 0744 Last data filed at 03/21/2021 0400 Gross per 24 hour  Intake 592.51 ml  Output 4300 ml  Net -3707.49 ml      Physical Exam   General:  Sitting up No resp difficulty HEENT: normal Neck: supple. Unable to see JVP. Carotids 2+ bilat; no bruits. No lymphadenopathy or thryomegaly appreciated. Cor: PMI nondisplaced. Regular rate & rhythm. No rubs, gallops or murmurs. + tunneled cath Lungs: clear Abdomen: obese soft, nontender, nondistended. No hepatosplenomegaly. No bruits or masses. Good bowel sounds. Extremities: no cyanosis, clubbing, rash, edema Neuro: alert & orientedx3, cranial nerves grossly intact. moves all 4 extremities w/o difficulty. Affect pleasant   Telemetry   NSR 80-90s Personally reviewed Personally reviewed   Labs    CBC Recent Labs    03/20/21 0012 03/21/21 0401  WBC 9.8 10.2  HGB 9.0*  9.3*  HCT 28.2* 29.0*  MCV 93.4 93.9  PLT 220 [id number]    Basic Metabolic Panel Recent Labs    03/20/21 0012 03/20/21 1600 03/21/21 0401  NA 123* 125* 126*  K 4.8 4.8 4.5  CL 90* 92* 91*  CO2 20* 22 24  GLUCOSE 103* 112* 129*  BUN 42* 50* 32*  CREATININE 7.46* 8.37* 6.69*  CALCIUM 8.9 9.2 9.3  MG 2.1  --  2.2  PHOS 3.7 3.2 3.7    Liver Function Tests Recent Labs    03/20/21 1600 03/21/21 0401  ALBUMIN 2.7* 3.0*     No results for input(s): LIPASE, AMYLASE in the last 72 hours. Cardiac Enzymes No results for input(s): CKTOTAL, CKMB, CKMBINDEX, TROPONINI in the last 72 hours.  BNP: BNP (last 3 results) Recent Labs    10/10/20 1336 11/07/20 0932 02/11/21 1500  BNP 126.9* 106.0* 194.3*     ProBNP (last 3 results) No results for input(s): PROBNP in the last 8760 hours.   D-Dimer No results for input(s): DDIMER in the last 72 hours.  Hemoglobin A1C No results for input(s): HGBA1C in the last 72 hours.  Fasting Lipid Panel No results for input(s): CHOL, HDL, LDLCALC, TRIG, CHOLHDL, LDLDIRECT in the last 72 hours. Thyroid Function Tests No results for input(s): TSH, T4TOTAL, T3FREE, THYROIDAB in the last 72 hours.  Invalid input(s): FREET3   Other results:   Imaging  No results found.   Medications:     Scheduled Medications:  amiodarone  400 mg Oral BID   apixaban  5 mg Oral BID   Chlorhexidine Gluconate Cloth  6 each Topical Q0600   insulin aspart  0-5 Units Subcutaneous QHS   insulin aspart  0-9 Units Subcutaneous TID WC   midodrine  15 mg Oral TID WC   multivitamin  1 tablet Oral QHS   polyethylene glycol  17 g Oral Daily   senna-docusate  2 tablet Oral BID   sodium chloride flush  10-40 mL Intracatheter Q12H    Infusions:  sodium chloride Stopped (03/14/21 1254)   sodium chloride     sodium chloride     sodium chloride     anticoagulant sodium citrate     DOBUTamine 2 mcg/kg/min (03/21/21 0400)   norepinephrine (LEVOPHED)  Adult infusion 4 mcg/min (03/21/21 0400)    PRN Medications: sodium chloride, sodium chloride, sodium chloride, sodium chloride, acetaminophen **OR** acetaminophen, alteplase, anticoagulant sodium citrate, camphor-menthol, diphenhydrAMINE, Gerhardt's butt cream, guaiFENesin-dextromethorphan, lidocaine, LORazepam, menthol-cetylpyridinium, ondansetron (ZOFRAN) IV, oxyCODONE, sodium chloride, sodium chloride  Assessment/Plan   1. Acute on Chronic Biventricular Heart Failure -> cardiogenic shock:  Known NICM since 2019, previously followed by [facility].  Echo 8/21 EF < 20%, moderate LV dilation, severely decreased RV function, severe biatrial enlargement, mild MR. No history of ETOH/drugs.  No FH of cardiomyopathy (mother with [place]).  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  He is too large for cardiac MRI. RHC/LHC on 03/04/20 with no CAD low CI at 2.1. Evaluated by EP but not a candidate for ICD given BMI.  Echo this admission with EF < 20%, RV poorly Dev eloped cardiogenic shock -> AKI in setting of AFL with RVR. Dobutamine started at 2.5 and increased to 7.5.  NE added to support CVVHD. He was transitioned to [facility] but unable to tolerate this with rising CVP and respiratory distress, inability to comply with fluid restriction.  Restarted CVVH for additional fluid removal, but again transitioned to iHD. Underwent HD yesterday with 4L off. CVP down to 12 On NE 4 and dobutamine 2. Co-ox 65% - Decrease DBA to 1 and NE to 2. Continue midodrine - iHD transition is going to be tricky with him, not sure that he will be able to manage his volume by iHD. If he fails transition to iHD again, we are out of options. Not candidate for advanced therapies. This has been explained to him several times. If fails to tolerate iHD off inotopes, he will need hospice. - With AKI/hypotension, stopped Entresto, dapagliflozin, spironolactone, digoxin.  - Reinforced importance of  fluid restriction  2. Aflutter/fibrillation w/ RVR:  Noted to be in Afib during a prior admission in 3/22 in the setting of CAP and a/c CHF, but converted to NSR on amiodarone. Discharged home w/ eliquis. Was in 2:1 AFL this admit in setting of a/c CHF w/ marked volume overload, + recent gastroenteritis w/ diarrhea. Has OSA but not using CPAP. TSH WNL. Had DCCV on 8/5 and again 8/22. Rmains in NSR today.  -  Continue PO amiodarone 400 mg BID - Continue Eliquis  3. AKI on CKD IV: Likely ATN/cardiorenal.CVVHD stopped on 8/9 after catheter clotted.  iHD started on 8/11 with 2L of volume removed. Trialysis cath switched 8/12. Tolerated iHD on 8/13 and had dialysis catheter placed 8/16.  On 8/16, CVVH restarted with respiratory distress.    - Transition to iHD on 8/24 with  mild improvement in CVP. Tolerated iHD yesterday well -4L - As above, 1 more trial of iHD. If cannot tolerate iHD off pressors will be hospice situation 4. Acute Hypoxic Respiratory Failure: Suspect underlying OHS/OSA exacerbated by CHF this admission. Now on nasal cannula.  - Continue oxygen  - Encouraged BiPAP at night - Volume removal with HD 5. Type 2 DM: Farxiga held with AKI.  - continue SSI 6. OSA: Has not been on Bipap at home.  7. Super Morbid Obesity: Body mass index is 57.28 kg/m. 8. UTI: Suspect by UA, sent urine culture.  - Ceftriaxone IV course completed.  9. Hyponatremia: Improved with CVVH 123 -> 126 - He has been counseled on free water restriction numerous times. This remains an issue.  10. ID: With fever, started empirically on vancomycin/cefepime.  Cultures NGTD.  CVL removed and PICC placed. He completed 5 days empiric abx.  11. 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 Platelets recovered.  SRA+.  - now back on Eliquis. Hgb stable  CRITICAL CARE Performed by: Glori Bickers  Total critical care time: 45 minutes  Critical care time was exclusive of  separately billable procedures and treating other patients.  Critical care was necessary to treat or prevent imminent or life-threatening deterioration.  Critical care was time spent personally by me (independent of midlevel providers or residents) on the following activities: development of treatment plan with patient and/or surrogate as well as nursing, discussions with consultants, evaluation of patient's response to treatment, examination of patient, obtaining history from patient or surrogate, ordering and performing treatments and interventions, ordering and review of laboratory studies, ordering and review of radiographic studies, pulse oximetry and re-evaluation of patient's condition.   Glori Bickers, MD 03/21/2021 7:44 AM

## 2021-03-22 DIAGNOSIS — I5023 Acute on chronic systolic (congestive) heart failure: Secondary | ICD-10-CM | POA: Diagnosis not present

## 2021-03-22 LAB — RENAL FUNCTION PANEL
Albumin: 2.8 g/dL — ABNORMAL LOW (ref 3.5–5.0)
Albumin: 2.8 g/dL — ABNORMAL LOW (ref 3.5–5.0)
Anion gap: 15 (ref 5–15)
Anion gap: 11 (ref 5–15)
BUN: 43 mg/dL — ABNORMAL HIGH (ref 6–20)
BUN: 49 mg/dL — ABNORMAL HIGH (ref 6–20)
CO2: 22 mmol/L (ref 22–32)
CO2: 25 mmol/L (ref 22–32)
Calcium: 9.5 mg/dL (ref 8.9–10.3)
Calcium: 9.2 mg/dL (ref 8.9–10.3)
Chloride: 88 mmol/L — ABNORMAL LOW (ref 98–111)
Chloride: 92 mmol/L — ABNORMAL LOW (ref 98–111)
Creatinine, Ser: 8.9 mg/dL — ABNORMAL HIGH (ref 0.61–1.24)
Creatinine, Ser: 9.19 mg/dL — ABNORMAL HIGH (ref 0.61–1.24)
GFR, Estimated: 7 mL/min — ABNORMAL LOW (ref >60)
GFR, Estimated: 7 mL/min — ABNORMAL LOW (ref >60)
Glucose, Bld: 154 mg/dL — ABNORMAL HIGH (ref 70–99)
Glucose, Bld: 119 mg/dL — ABNORMAL HIGH (ref 70–99)
Phosphorus: 4.4 mg/dL (ref 2.5–4.6)
Phosphorus: 5.3 mg/dL — ABNORMAL HIGH (ref 2.5–4.6)
Potassium: 4.7 mmol/L (ref 3.5–5.1)
Potassium: 4.8 mmol/L (ref 3.5–5.1)
Sodium: 125 mmol/L — ABNORMAL LOW (ref 135–145)
Sodium: 128 mmol/L — ABNORMAL LOW (ref 135–145)

## 2021-03-22 LAB — COOXEMETRY PANEL
Carboxyhemoglobin: 1 % (ref 0.5–1.5)
Methemoglobin: 0.9 % (ref 0.0–1.5)
O2 Saturation: 45.5 %
Total hemoglobin: 8.8 g/dL — ABNORMAL LOW (ref 12.0–16.0)

## 2021-03-22 LAB — CBC
HCT: 28.7 % — ABNORMAL LOW (ref 39.0–52.0)
Hemoglobin: 8.8 g/dL — ABNORMAL LOW (ref 13.0–17.0)
MCH: 29.4 pg (ref 26.0–34.0)
MCHC: 30.7 g/dL (ref 30.0–36.0)
MCV: 96 fL (ref 80.0–100.0)
Platelets: 275 10*3/uL (ref 150–400)
RBC: 2.99 MIL/uL — ABNORMAL LOW (ref 4.22–5.81)
RDW: 18.6 % — ABNORMAL HIGH (ref 11.5–15.5)
WBC: 9.9 10*3/uL (ref 4.0–10.5)
nRBC: 0.2 % (ref 0.0–0.2)

## 2021-03-22 LAB — GLUCOSE, CAPILLARY
Glucose-Capillary: 198 mg/dL — ABNORMAL HIGH (ref 70–99)
Glucose-Capillary: 155 mg/dL — ABNORMAL HIGH (ref 70–99)
Glucose-Capillary: 122 mg/dL — ABNORMAL HIGH (ref 70–99)
Glucose-Capillary: 218 mg/dL — ABNORMAL HIGH (ref 70–99)

## 2021-03-22 LAB — MAGNESIUM: Magnesium: 2.2 mg/dL (ref 1.7–2.4)

## 2021-03-22 MED ORDER — MIDODRINE HCL 5 MG PO TABS
20.0000 mg | ORAL_TABLET | Freq: Three times a day (TID) | ORAL | Status: DC
  Administered 2021-03-22 – 2021-03-26 (×12): 20 mg via ORAL
  Filled 2021-03-22 (×12): qty 4

## 2021-03-22 MED ORDER — FUROSEMIDE 10 MG/ML IJ SOLN
160.0000 mg | Freq: Once | INTRAVENOUS | Status: AC
  Administered 2021-03-22: 160 mg via INTRAVENOUS
  Filled 2021-03-22: qty 16

## 2021-03-22 MED ORDER — AMIODARONE IV BOLUS ONLY 150 MG/100ML
150.0000 mg | INTRAVENOUS | Status: AC
  Administered 2021-03-22 (×2): 150 mg via INTRAVENOUS
  Filled 2021-03-22 (×2): qty 100

## 2021-03-22 NOTE — Progress Notes (Signed)
Patient ID: Phillip Young, male   DOB: 1986-04-02, 35 y.o.   MRN: [id number]      Advanced Heart Failure Rounding Note  PCP-Cardiologist: Dr. [name]  Subjective:    DBA dropped to 1 yesterday. NE stopped.  NE had to be restarted due to low BP. NE at 6 now. Co-ox 46%. CVP stable at 12   Patient consistently giving staff a hard time throughout the day. Demanding drinks/food and refusing to comply with any medical instructions. Refused IV lasix yesterday and took himself off the monitor.  Went back into AF yesterday afternoon  Has a tray full of food and drinks despite repeated requests to limit intake so that dialysis can keep up with him and keep the fluid off.    Objective:   Weight Range: (!) 186.3 kg Body mass index is 57.28 kg/m.   Vital Signs:   Temp:  [98 F (36.7 C)-98.2 F (36.8 C)] 98.1 F (36.7 C) (08/28 0819) Pulse Rate:  [74-114] 97 (08/28 0800) Resp:  [12-30] 20 (08/28 0800) BP: (80-137)/(49-93) 106/58 (08/28 0500) SpO2:  [84 %-100 %] 97 % (08/28 0800) Last BM Date: 03/20/21  Weight change: Filed Weights   03/20/21 1615 03/20/21 2031 03/21/21 0500  Weight: (!) 192.2 kg (!) 188.2 kg (!) 186.3 kg    Intake/Output:   Intake/Output Summary (Last 24 hours) at 03/22/2021 0925 Last data filed at 03/22/2021 0600 Gross per 24 hour  Intake 966.72 ml  Output --  Net 966.72 ml      Physical Exam   General:  Sitting up in bed. No resp difficulty HEENT: normal Neck: supple. JVP hard to see given size  Carotids 2+ bilat; no bruits. No lymphadenopathy or thryomegaly appreciated. Cor: PMI nondisplaced. Irregular tachy No rubs, gallops or murmurs. + tunneled cath Lungs: coarse Abdomen: obese soft, nontender, nondistended. No hepatosplenomegaly. No bruits or masses. Good bowel sounds. Extremities: no cyanosis, clubbing, rash, 1+ edema Neuro: alert & orientedx3, cranial nerves grossly intact. moves all 4 extremities w/o difficulty. Affect pleasant  Telemetry   AF  130s Personally reviewed   Labs    CBC Recent Labs    03/21/21 0401 03/22/21 0335  WBC 10.2 9.9  HGB 9.3* 8.8*  HCT 29.0* 28.7*  MCV 93.9 96.0  PLT 250 [id number]    Basic Metabolic Panel Recent Labs    03/21/21 0401 03/21/21 1635 03/22/21 0335  NA 126* 127* 125*  K 4.5 4.7 4.7  CL 91* 91* 88*  CO2 '24 25 22  '$ GLUCOSE 129* 113* 154*  BUN 32* 38* 43*  CREATININE 6.69* 8.15* 8.90*  CALCIUM 9.3 9.2 9.5  MG 2.2  --  2.2  PHOS 3.7 4.8* 4.4    Liver Function Tests Recent Labs    03/21/21 1635 03/22/21 0335  ALBUMIN 2.9* 2.8*     No results for input(s): LIPASE, AMYLASE in the last 72 hours. Cardiac Enzymes No results for input(s): CKTOTAL, CKMB, CKMBINDEX, TROPONINI in the last 72 hours.  BNP: BNP (last 3 results) Recent Labs    10/10/20 1336 11/07/20 0932 02/11/21 1500  BNP 126.9* 106.0* 194.3*     ProBNP (last 3 results) No results for input(s): PROBNP in the last 8760 hours.   D-Dimer No results for input(s): DDIMER in the last 72 hours.  Hemoglobin A1C No results for input(s): HGBA1C in the last 72 hours.  Fasting Lipid Panel No results for input(s): CHOL, HDL, LDLCALC, TRIG, CHOLHDL, LDLDIRECT in the last 72 hours. Thyroid Function Tests No results  for input(s): TSH, T4TOTAL, T3FREE, THYROIDAB in the last 72 hours.  Invalid input(s): FREET3   Other results:   Imaging    No results found.   Medications:     Scheduled Medications:  amiodarone  400 mg Oral BID   apixaban  5 mg Oral BID   Chlorhexidine Gluconate Cloth  6 each Topical Q0600   insulin aspart  0-5 Units Subcutaneous QHS   insulin aspart  0-9 Units Subcutaneous TID WC   midodrine  15 mg Oral TID WC   multivitamin  1 tablet Oral QHS   polyethylene glycol  17 g Oral Daily   senna-docusate  2 tablet Oral BID   sodium chloride flush  10-40 mL Intracatheter Q12H    Infusions:  sodium chloride Stopped (03/14/21 1254)   sodium chloride     sodium chloride     sodium  chloride     anticoagulant sodium citrate     DOBUTamine 1 mcg/kg/min (03/22/21 0600)   norepinephrine (LEVOPHED) Adult infusion 6 mcg/min (03/22/21 0600)    PRN Medications: sodium chloride, sodium chloride, sodium chloride, sodium chloride, acetaminophen **OR** acetaminophen, alteplase, anticoagulant sodium citrate, camphor-menthol, diphenhydrAMINE, Gerhardt's butt cream, guaiFENesin-dextromethorphan, lidocaine, LORazepam, menthol-cetylpyridinium, ondansetron (ZOFRAN) IV, oxyCODONE, sodium chloride, sodium chloride  Assessment/Plan   1. Acute on Chronic Biventricular Heart Failure -> cardiogenic shock:  Known NICM since 2019, previously followed by [facility].  Echo 8/21 EF < 20%, moderate LV dilation, severely decreased RV function, severe biatrial enlargement, mild MR. No history of ETOH/drugs.  No FH of cardiomyopathy (mother with [place]).  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  He is too large for cardiac MRI. RHC/LHC on 03/04/20 with no CAD low CI at 2.1. Evaluated by EP but not a candidate for ICD given BMI.  Echo this admission with EF < 20%, RV poorly Dev eloped cardiogenic shock -> AKI in setting of AFL with RVR. Dobutamine started at 2.5 and increased to 7.5.  NE added to support CVVHD. He was transitioned to [facility] but unable to tolerate this with rising CVP and respiratory distress, inability to comply with fluid restriction.  Restarted CVVH for additional fluid removal, but again transitioned to iHD. Underwent HD 8/26 with 4L off and CVP down to 12. DBA dropped to 1 yesterday. NE stopped.  NE had to be restarted due to low BP. NE at 6 now. Co-ox 46%. CVP 12 He received IV lasix trial yesterday and did not respond - He is again failing transition to iHD and wean of inotropes for both physiologic reasons and abject noncompliance - I explained to him that there is no way he will be able to be maintained with outpatient iHD given his current  situation of low output and non-compliance. The only option now is hospice but he refuses - Will drop NE to 4 and keep DBA at 1 for now. Will need to consider stopping inotropes this week and see if he can fly or not. Otherwise there is no way to get him to outpatient HD. - With AKI/hypotension, stopped Entresto, dapagliflozin, spironolactone, digoxin.  - Reinforced importance of fluid restriction  2. Aflutter/fibrillation w/ RVR:  Noted to be in Afib during a prior admission in 3/22 in the setting of CAP and a/c CHF, but converted to NSR on amiodarone. Discharged home w/ eliquis. Was in 2:1 AFL this admit in setting of a/c CHF w/ marked volume overload, + recent gastroenteritis w/ diarrhea. Has OSA but not using CPAP. TSH  WNL. Had DCCV on 8/5 and again 8/22. Back in AF today.  -  Will continue PO amiodarone 400 mg BID to avoid volume load with IV. Bolus IV amio now and can repeat.  - Continue Eliquis. Hgb stable 3. AKI on CKD IV: Likely ATN/cardiorenal.CVVHD stopped on 8/9 after catheter clotted.  iHD started on 8/11 with 2L of volume removed. Trialysis cath switched 8/12. Tolerated iHD on 8/13 and had dialysis catheter placed 8/16.  On 8/16, CVVH restarted with respiratory distress.  Failed trial of IV lasix on 8/27 - He is again failing transition to iHD and wean of inotropes for both physiologic reasons and abject noncompliance - There is no way he will be able to be maintained with outpatient iHD. The only option now is hospice - D/w Renal today. Will plan iHD again tomorrow.  4. Acute Hypoxic Respiratory Failure: Suspect underlying OHS/OSA exacerbated by CHF this admission. Now on nasal cannula.  - Continues to refuse BIPAP at night - Volume management complicted by noncomplaince 5. Type 2 DM: Farxiga held with AKI.  - continue SSI 6. OSA: Has not been on Bipap at home.  7. Super Morbid Obesity: Body mass index is 57.28 kg/m. 8. UTI: Suspect by UA, sent urine culture.  - Ceftriaxone IV  course completed.  9. Hyponatremia: Improved with CVVH 123 -> 126 -> 125 - He has been counseled on free water restriction numerous times. This remains an issue.  10. ID: With fever, started empirically on vancomycin/cefepime.  Cultures NGTD.  CVL removed and PICC placed. He completed 5 days empiric abx.  11. 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 Platelets recovered.  SRA+.  - now back on Eliquis. Hgb stable 8.8  CRITICAL CARE Performed by: Glori Bickers  Total critical care time: 35 minutes  Critical care time was exclusive of separately billable procedures and treating other patients.  Critical care was necessary to treat or prevent imminent or life-threatening deterioration.  Critical care was time spent personally by me (independent of midlevel providers or residents) on the following activities: development of treatment plan with patient and/or surrogate as well as nursing, discussions with consultants, evaluation of patient's response to treatment, examination of patient, obtaining history from patient or surrogate, ordering and performing treatments and interventions, ordering and review of laboratory studies, ordering and review of radiographic studies, pulse oximetry and re-evaluation of patient's condition.   Glori Bickers, MD 03/22/2021 9:25 AM

## 2021-03-22 NOTE — Progress Notes (Signed)
Pt continues to ignore fluid restriction and bullies staff to bring him drinks. Nurse has educated pt on the importance of the fluid restriction.   Pt Coox <50% notified Dr. Renella Cunas at aprox 0400.  Pt states he will give day shift team a "hard time" if they do not give him what he "demands" today 03-22-21.   Pt is A/Ox4 and connected to the monitor.  Nurse continues to give care and emotional support.

## 2021-03-22 NOTE — Progress Notes (Signed)
[facility] NEPHROLOGY PROGRESS NOTE  Assessment/ Plan:  # Dialysis dependent acute kidney Injury: Secondary to cardiogenic shock/hemodynamically mediated in the setting of atrial flutter with RVR and underlying severe biventricular congestive heart failure.  Previously he failed to transition to intermittent hemodialysis before Resumed CRRT on 8/19 - 8/24 Reattempted iHD starting 8/24: 4h, TDC, 2K, 4-5L UF goal, no heparin, Na 134; has tolerated first 2 Tx while on inotrope and pressor Rx Lasix to try to augment UOP if patient chooses to receive  # Hyponatremia: Secondary to no GFR, CHF with poorly restricted free water intake.  Continue 0.5 L fluid restriction.  On a daily basis, rationale discussed with patient.  Dialysis in the setting of hyponatremia has increased risk.  Using lower Na bath 134, reduced BFR  #Hypotension / Prolonged cardiogenic shock.  Weening, on dobutamine, Levophed and midodrine 15 mg 3 times daily.  Per AHF  # HIT: Transitioned to bivalirudin from heparin after positive HIT panel on 8/11.  # Biventricular congestive heart failure:  Not a candidate for advanced therapies due to morbid obesity.  Volume management with RRT.  Heart failure team is following.  #  Atrial flutter with RVR: DCCV 8/22  #CKD-BMD: Phosphorus and calcium at target, CTM  # ongoing severe dietary Na and fluid indiscretions: cont to educate and encourage adherence  Subjective:  Is not adherent to diet/fluid restriciton Low dose dobut and NE No recorded UOP Refusing lasix CVP 11 SNa 125 this AM  Objective Vital signs in last 24 hours: Vitals:   03/22/21 0500 03/22/21 0600 03/22/21 0800 03/22/21 0819  BP: (!) 106/58     Pulse: 94 98 97   Resp: 20 (!) 26 20   Temp:    98.1 F (36.7 C)  TempSrc:    Oral  SpO2: 95% 98% 97%   Weight:      Height:       Weight change:   Intake/Output Summary (Last 24 hours) at 03/22/2021 1014 Last data filed at 03/22/2021 0600 Gross  per 24 hour  Intake 966.72 ml  Output --  Net 966.72 ml        Labs: Basic Metabolic Panel: Recent Labs  Lab 03/21/21 0401 03/21/21 1635 03/22/21 0335  NA 126* 127* 125*  K 4.5 4.7 4.7  CL 91* 91* 88*  CO2 '24 25 22  '$ GLUCOSE 129* 113* 154*  BUN 32* 38* 43*  CREATININE 6.69* 8.15* 8.90*  CALCIUM 9.3 9.2 9.5  PHOS 3.7 4.8* 4.4    Liver Function Tests: Recent Labs  Lab 03/21/21 0401 03/21/21 1635 03/22/21 0335  ALBUMIN 3.0* 2.9* 2.8*    No results for input(s): LIPASE, AMYLASE in the last 168 hours. No results for input(s): AMMONIA in the last 168 hours. CBC: Recent Labs  Lab 03/18/21 0439 03/19/21 0454 03/20/21 0012 03/21/21 0401 03/22/21 0335  WBC 10.7* 9.3 9.8 10.2 9.9  HGB 9.4* 8.8* 9.0* 9.3* 8.8*  HCT 30.1* 28.1* 28.2* 29.0* 28.7*  MCV 96.2 94.6 93.4 93.9 96.0  PLT 270 216 220 250 275    Cardiac Enzymes: No results for input(s): CKTOTAL, CKMB, CKMBINDEX, TROPONINI in the last 168 hours. CBG: Recent Labs  Lab 03/20/21 1112 03/20/21 1639 03/21/21 1120 03/21/21 2120 03/22/21 0734  GLUCAP 89 79 108* 164* 198*     Iron Studies: No results for input(s): IRON, TIBC, TRANSFERRIN, FERRITIN in the last 72 hours. Studies/Results: No results found.  Medications: Infusions:  sodium chloride Stopped (03/14/21 1254)   sodium chloride  sodium chloride     sodium chloride     anticoagulant sodium citrate     DOBUTamine 1 mcg/kg/min (03/22/21 0600)   norepinephrine (LEVOPHED) Adult infusion 6 mcg/min (03/22/21 0600)    Scheduled Medications:  amiodarone  400 mg Oral BID   apixaban  5 mg Oral BID   Chlorhexidine Gluconate Cloth  6 each Topical Q0600   insulin aspart  0-5 Units Subcutaneous QHS   insulin aspart  0-9 Units Subcutaneous TID WC   midodrine  20 mg Oral TID WC   multivitamin  1 tablet Oral QHS   polyethylene glycol  17 g Oral Daily   senna-docusate  2 tablet Oral BID   sodium chloride flush  10-40 mL Intracatheter Q12H    have  reviewed scheduled and prn medications.  Physical Exam: General: Morbidly obese male, not in distress.  Lying in bed Heart:RRR, s1s2 nl Lungs: Distant and coarse breath sound Abdomen:soft, obese, nontender Extremities: Trace LEE, centrally located Dialysis Access: Right IJ TDC.  Justyn Langham B Maryah Marinaro 03/22/2021,10:14 AM  LOS: 39 days

## 2021-03-23 DIAGNOSIS — R570 Cardiogenic shock: Secondary | ICD-10-CM | POA: Diagnosis not present

## 2021-03-23 DIAGNOSIS — I5023 Acute on chronic systolic (congestive) heart failure: Secondary | ICD-10-CM | POA: Diagnosis not present

## 2021-03-23 LAB — RENAL FUNCTION PANEL
Albumin: 2.8 g/dL — ABNORMAL LOW (ref 3.5–5.0)
Albumin: 3.1 g/dL — ABNORMAL LOW (ref 3.5–5.0)
Anion gap: 12 (ref 5–15)
Anion gap: 11 (ref 5–15)
BUN: 55 mg/dL — ABNORMAL HIGH (ref 6–20)
BUN: 34 mg/dL — ABNORMAL HIGH (ref 6–20)
CO2: 23 mmol/L (ref 22–32)
CO2: 25 mmol/L (ref 22–32)
Calcium: 9.4 mg/dL (ref 8.9–10.3)
Calcium: 9.2 mg/dL (ref 8.9–10.3)
Chloride: 91 mmol/L — ABNORMAL LOW (ref 98–111)
Chloride: 94 mmol/L — ABNORMAL LOW (ref 98–111)
Creatinine, Ser: 9.68 mg/dL — ABNORMAL HIGH (ref 0.61–1.24)
Creatinine, Ser: 7.38 mg/dL — ABNORMAL HIGH (ref 0.61–1.24)
GFR, Estimated: 7 mL/min — ABNORMAL LOW (ref >60)
GFR, Estimated: 9 mL/min — ABNORMAL LOW (ref >60)
Glucose, Bld: 147 mg/dL — ABNORMAL HIGH (ref 70–99)
Glucose, Bld: 122 mg/dL — ABNORMAL HIGH (ref 70–99)
Phosphorus: 5.6 mg/dL — ABNORMAL HIGH (ref 2.5–4.6)
Phosphorus: 4 mg/dL (ref 2.5–4.6)
Potassium: 4.7 mmol/L (ref 3.5–5.1)
Potassium: 4.6 mmol/L (ref 3.5–5.1)
Sodium: 126 mmol/L — ABNORMAL LOW (ref 135–145)
Sodium: 130 mmol/L — ABNORMAL LOW (ref 135–145)

## 2021-03-23 LAB — COOXEMETRY PANEL
Carboxyhemoglobin: 1.6 % — ABNORMAL HIGH (ref 0.5–1.5)
Methemoglobin: 0.9 % (ref 0.0–1.5)
O2 Saturation: 61.8 %
Total hemoglobin: 8.8 g/dL — ABNORMAL LOW (ref 12.0–16.0)

## 2021-03-23 LAB — GLUCOSE, CAPILLARY
Glucose-Capillary: 100 mg/dL — ABNORMAL HIGH (ref 70–99)
Glucose-Capillary: 132 mg/dL — ABNORMAL HIGH (ref 70–99)
Glucose-Capillary: 128 mg/dL — ABNORMAL HIGH (ref 70–99)
Glucose-Capillary: 115 mg/dL — ABNORMAL HIGH (ref 70–99)

## 2021-03-23 LAB — CBC
HCT: 28.4 % — ABNORMAL LOW (ref 39.0–52.0)
Hemoglobin: 8.9 g/dL — ABNORMAL LOW (ref 13.0–17.0)
MCH: 30.1 pg (ref 26.0–34.0)
MCHC: 31.3 g/dL (ref 30.0–36.0)
MCV: 95.9 fL (ref 80.0–100.0)
Platelets: 288 10*3/uL (ref 150–400)
RBC: 2.96 MIL/uL — ABNORMAL LOW (ref 4.22–5.81)
RDW: 19 % — ABNORMAL HIGH (ref 11.5–15.5)
WBC: 9.1 10*3/uL (ref 4.0–10.5)
nRBC: 0 % (ref 0.0–0.2)

## 2021-03-23 LAB — MAGNESIUM: Magnesium: 2.3 mg/dL (ref 1.7–2.4)

## 2021-03-23 LAB — PROTIME-INR
INR: 1.3 — ABNORMAL HIGH (ref 0.8–1.2)
Prothrombin Time: 15.8 seconds — ABNORMAL HIGH (ref 11.4–15.2)

## 2021-03-23 NOTE — Progress Notes (Signed)
While doing linen change, nurse found multiple zip-lock bags with bbq sandwiches that family had snuck in and pt hid under sheets. Nurse educated pt on the importance of following the care guide.  Pt refused to give sandwiches to nurse.  It is unknown how much food and drinks the pt has been given from visitors.

## 2021-03-23 NOTE — Progress Notes (Signed)
   Cardioversion set up for tomorrow at 07:30   NPO after midnight.   Phillip Ridlon NP-C  8:21 AM

## 2021-03-23 NOTE — Progress Notes (Signed)
PT Cancellation Note  Patient Details Name: Phillip Young MRN: [id number] DOB: 10/01/1985   Cancelled Treatment:    Reason Eval/Treat Not Completed: Patient at procedure or test/unavailable. Pt receiving HD. Will continue to follow.   [place] 03/23/2021, 9:19 AM [place] Pager [phone] Office [phone]

## 2021-03-23 NOTE — Progress Notes (Addendum)
Patient ID: Phillip Young, male   DOB: [birth year], 35 y.o.   MRN: [id number]      Advanced Heart Failure Rounding Note  PCP-Cardiologist: Dr. [name]  Subjective:    He is on dobutamine 1 and NE 6 today, co-ox 62%.  CVP 14. Weight up over weekend with no HD.   Still not following fluid restriction.  Hiding food in his bed from outside (BBQ sandwiches in ziplock bags).   Back in AF again.   Getting HD this morning.   Objective:   Weight Range: (!) 191 kg Body mass index is 58.73 kg/m.   Vital Signs:   Temp:  [96.6 F (35.9 C)-99 F (37.2 C)] 98.3 F (36.8 C) (08/29 0700) Pulse Rate:  [82-120] 99 (08/29 0730) Resp:  [12-38] 26 (08/29 0730) BP: (66-160)/(41-118) 110/80 (08/29 0730) SpO2:  [86 %-100 %] 97 % (08/29 0730) Weight:  [191 kg] 191 kg (08/29 0400) Last BM Date: 03/20/21  Weight change: Filed Weights   03/20/21 2031 03/21/21 0500 03/23/21 0400  Weight: (!) 188.2 kg (!) 186.3 kg (!) 191 kg    Intake/Output:   Intake/Output Summary (Last 24 hours) at 03/23/2021 0749 Last data filed at 03/23/2021 0700 Gross per 24 hour  Intake 1237.45 ml  Output 110 ml  Net 1127.45 ml     Physical Exam   General: NAD Neck: JVP difficult, no thyromegaly or thyroid nodule.  Lungs: Clear to auscultation bilaterally with normal respiratory effort. CV: Nonpalpable PMI.  Heart mildly tachy, irregular S1/S2, no S3/S4, no murmur.  Trace ankle edema.  Abdomen: Soft, nontender, no hepatosplenomegaly, no distention.  Skin: Intact without lesions or rashes.  Neurologic: Alert and oriented x 3.  Psych: Normal affect. Extremities: No clubbing or cyanosis.  HEENT: Normal.   Telemetry   AF 100s Personally reviewed   Labs    CBC Recent Labs    03/22/21 0335 03/23/21 0350  WBC 9.9 9.1  HGB 8.8* 8.9*  HCT 28.7* 28.4*  MCV 96.0 95.9  PLT 275 [id number]   Basic Metabolic Panel Recent Labs    03/22/21 0335 03/22/21 1600 03/23/21 0350  NA 125* 128* 126*  K 4.7 4.8 4.7  CL 88*  92* 91*  CO2 '22 25 23  '$ GLUCOSE 154* 119* 147*  BUN 43* 49* 55*  CREATININE 8.90* 9.19* 9.68*  CALCIUM 9.5 9.2 9.4  MG 2.2  --  2.3  PHOS 4.4 5.3* 5.6*   Liver Function Tests Recent Labs    03/22/21 1600 03/23/21 0350  ALBUMIN 2.8* 2.8*    No results for input(s): LIPASE, AMYLASE in the last 72 hours. Cardiac Enzymes No results for input(s): CKTOTAL, CKMB, CKMBINDEX, TROPONINI in the last 72 hours.  BNP: BNP (last 3 results) Recent Labs    10/10/20 1336 11/07/20 0932 02/11/21 1500  BNP 126.9* 106.0* 194.3*    ProBNP (last 3 results) No results for input(s): PROBNP in the last 8760 hours.   D-Dimer No results for input(s): DDIMER in the last 72 hours.  Hemoglobin A1C No results for input(s): HGBA1C in the last 72 hours.  Fasting Lipid Panel No results for input(s): CHOL, HDL, LDLCALC, TRIG, CHOLHDL, LDLDIRECT in the last 72 hours. Thyroid Function Tests No results for input(s): TSH, T4TOTAL, T3FREE, THYROIDAB in the last 72 hours.  Invalid input(s): FREET3   Other results:   Imaging    No results found.   Medications:     Scheduled Medications:  amiodarone  400 mg Oral BID   apixaban  5 mg Oral BID   Chlorhexidine Gluconate Cloth  6 each Topical Q0600   insulin aspart  0-5 Units Subcutaneous QHS   insulin aspart  0-9 Units Subcutaneous TID WC   midodrine  20 mg Oral TID WC   multivitamin  1 tablet Oral QHS   polyethylene glycol  17 g Oral Daily   senna-docusate  2 tablet Oral BID   sodium chloride flush  10-40 mL Intracatheter Q12H    Infusions:  sodium chloride Stopped (03/14/21 1254)   sodium chloride     sodium chloride     sodium chloride     anticoagulant sodium citrate     norepinephrine (LEVOPHED) Adult infusion 6 mcg/min (03/23/21 0400)    PRN Medications: sodium chloride, sodium chloride, sodium chloride, sodium chloride, acetaminophen **OR** acetaminophen, alteplase, anticoagulant sodium citrate, camphor-menthol,  diphenhydrAMINE, Gerhardt's butt cream, guaiFENesin-dextromethorphan, lidocaine, LORazepam, menthol-cetylpyridinium, ondansetron (ZOFRAN) IV, oxyCODONE, sodium chloride, sodium chloride  Assessment/Plan   1. Acute on Chronic Biventricular Heart Failure -> cardiogenic shock:  Known NICM since 2019, previously followed by [facility].  Echo 8/21 EF < 20%, moderate LV dilation, severely decreased RV function, severe biatrial enlargement, mild MR. No history of ETOH/drugs.  No FH of cardiomyopathy (mother with [place]).  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  He is too large for cardiac MRI. RHC/LHC on 03/04/20 with no CAD low CI at 2.1. Evaluated by EP but not a candidate for ICD given BMI.  Echo this admission with EF < 20%, RV poorly visualized.  Developed cardiogenic shock -> AKI in setting of AFL with RVR. Dobutamine started at 2.5 and increased to 7.5.  NE added to support CVVHD. He was transitioned to [facility] but unable to tolerate this with rising CVP and respiratory distress, inability to comply with fluid restriction.  Restarted CVVH for additional fluid removal, but again transitioned to iHD. Getting iHD this morning.  CVP 14, weight up over weekend.  Co-ox 62% on dobutamine 1 and NE 6.  - I have explained to him that there is no way he will be able to be maintained with outpatient iHD given his current situation of low output and non-compliance. The best option now is likely hospice but he refuses.  - After HD today, stop dobutamine and decrease NE to 3.  Will try not to increase.  - Plan for iHD today, then repeat DCCV tomorrow and stop the rest of his inotropes and hopefully work towards home if he tolerates HD off pressors/inotropes on Wednesday.  I suspect he will fail iHD unless he can control his intake which he has been unsuccessful with so far.  - With AKI/hypotension, stopped Entresto, dapagliflozin, spironolactone, digoxin.  2.  Aflutter/fibrillation w/ RVR:  Noted to be in Afib during a prior admission in 3/22 in the setting of CAP and a/c CHF, but converted to NSR on amiodarone. Discharged home w/ eliquis. Was in 2:1 AFL this admit in setting of a/c CHF w/ marked volume overload, + recent gastroenteritis w/ diarrhea. Has OSA but not using CPAP. TSH WNL. Had DCCV on 8/5 and again 8/22. Back in AF over the weekend.   -  Will continue PO amiodarone 400 mg BID to avoid volume load with IV.   - Continue Eliquis.  - Will attempt DCCV one more time tomorrow, hopefully off pressors/inotropes.  3. AKI on CKD IV: Likely ATN/cardiorenal.CVVHD stopped on 8/9 after catheter clotted.  iHD started on 8/11 with 2L of volume removed. Trialysis  cath switched 8/12. Tolerated iHD on 8/13 and had dialysis catheter placed 8/16.  On 8/16, CVVH restarted with respiratory distress.  He has been transitioned to [facility] for a 2nd time.  CVP 14 today, weight up considerably over weekend due to noncompliance with fluid/sodium restriction.  - He is again failing transition to iHD and wean of inotropes for both physiologic reasons and abject noncompliance - At this point, with refusal of hospice care, only option will be weaning him off dobutamine/NE and and doing the best we can with iHD.  As above, will try to aim for home after Wednesday HD.  However, future for him looks pretty bleak.  4. Acute Hypoxic Respiratory Failure: Suspect underlying OHS/OSA exacerbated by CHF this admission. Now on nasal cannula.  - Continues to refuse BIPAP at night - Volume management complicated by noncompliance 5. Type 2 DM: Farxiga held with AKI.  - continue SSI 6. OSA: Has not been on Bipap at home.  7. Super Morbid Obesity: Body mass index is 58.73 kg/m. 8. UTI: Suspect by UA, sent urine culture.  - Ceftriaxone IV course completed.  9. Hyponatremia: Improved with CVVH 123 -> 126 -> 125 -> 126 - He has been counseled on free water restriction numerous times. This  remains an issue.  10. ID: With fever, started empirically on vancomycin/cefepime.  Cultures NGTD.  CVL removed and PICC placed. He completed 5 days empiric abx.  11. 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 Platelets recovered.  SRA+.  - now back on Eliquis. Hgb stable 8.9  CRITICAL CARE Performed by: [name]  Total critical care time: 35 minutes  Critical care time was exclusive of separately billable procedures and treating other patients.  Critical care was necessary to treat or prevent imminent or life-threatening deterioration.  Critical care was time spent personally by me (independent of midlevel providers or residents) on the following activities: development of treatment plan with patient and/or surrogate as well as nursing, discussions with consultants, evaluation of patient's response to treatment, examination of patient, obtaining history from patient or surrogate, ordering and performing treatments and interventions, ordering and review of laboratory studies, ordering and review of radiographic studies, pulse oximetry and re-evaluation of patient's condition.   [name], MD 03/23/2021 7:49 AM

## 2021-03-23 NOTE — Ethics Note (Signed)
Ethics Consult Note  Initial contact w/ medical team 03/23/21, which was on patient's hospital day 40   Source of Consult: Dr. [name] Current attending physician/service: Larey Dresser, MD  Reason(s) for consult and ethical question(s): IS MEDICAL TEAM ETHICALLY OBLIGATED TO HONOR PATIENT REQUEST FOR UNREASONABLE / FUTILE TREATMENT?   Information-gathering: Discussion with source of consult Chart review 03/23/21    Narrative:  Medical facts: Patient is a 35 yo gentleman w/ history of cardiomyopathy and CHF, nonadherent to medical therapy, hospitalized for CHF exacerbation complicated by renal failure. He has been on and off pressors and inotropes to maintain cardiac function, has not thus far been able to tolerate weaning off CVVH to hemodialysis. Medical team has planned to wean off pressors/inotropes and try again for HD soon, without plan to resume CVVH as it has not resulted in meaningful improvement, but there is concern that the patient will decompensate. Patient has requested to continue Full [place] "do everything."       With regard to the applicable underlying ethical principles, the standards of ethical care and relevant resources were discussed directly with the attending physician.  Ethics committee strives to ensure that all necessary and appropriate steps are taken, such that all decisions made for this patient are ethically justifiable. Ethics offers the following recommendations:     Recommendations:  1) STRONGLY ADVISE that the patient complete and advanced directive / MOST if he hasn't already done so.   2) In the interest of honoring patient autonomy (especially since the medical team can confer with the patient who is conscious and who has capacity) the argument can be made that agreeing his wishes would be ethical, even if aggressive resuscitation would a) be unsuccessful and patient dies, b) achieve ROSC but not consciousness (anoxic brain injury and its sequelae  is a risk to discuss with him) or c) be successful in achieving ROSC/consciousness and yet the patient is no better off than he is now and still in the same situation or likely worse and owuld be unlikely to ever leave the hospital, given his current requirements for care. If patient earnestly is agreeable these options, it is ethically reasonable to offer  and to perform CPR if/when he arrests.   3) The attending physician (in cooperation with at least one other physician consultant) is ethically justified in withdrawing or in not offering treatments IF those physicians are convinced that such measures are futile, i.e. will not improve patient's QOL, will serve no medical benefit, or may in fact cause harm. In this case, the treatment in question is CPR/resuscitation in event of cardiopulmonary arrest.    3) If the attending decides NOT to offer CPR on the basis of medical futility, patient / his surrogate insists upon treatments which the medical team considers futile, they have the right to attempt transfer to a facility that agrees to provide those treatments. (Of note, there is no designated time allowance frame for transfer, but it is advised to set clear expectations as much as possible. Patient/proxy ought to be making demonstrable efforts at transfer if transfer is desired. It is reasonable for attending physicians to use their best judgment to determine deadlines for transfer, or deadlines for withdrawal of life support if transfer attempts are not successful. It is reasonable to keep unconscious patients on life support for a limited time to allow family to gather at bedside prior to withdrawal of life support. In this case, it would be reasonable to delay restarting HD / weaning  meds if patient wants to transfer facilities.)   4) If there appears to be room to attempt shared decision making with the involvement of Ethics, Ethics Committee will strive to make 2+ committee members available for a  meeting between Ethics representatives, the patient and whoever the patient designates as HCPOA to explore this more. Unit manager will be asked to arrange a time/place that is agreeable to all relevant parties. This meeting should limit its attendees to the patient, attending physician, relevant consultants including members of the ethics committee, relevant nursing staff and other medical staff as appropriate, and patient's legal surrogate decision-maker(s) or other support parties who are authorized to receive HIPAA-protected information.       Thank you for this consult. Ethics will continue to follow this case.   Dr. [name] has my personal cell phone number and has my permission to share this number at their discretion.  Secure message on Epic is also welcome but may not receive an immediate response.  Please reference AMION for on-call committee member if needed.    [place]

## 2021-03-23 NOTE — Plan of Care (Signed)
  Problem: Education: Goal: Knowledge of General Education information will improve Description: Including pain rating scale, medication(s)/side effects and non-pharmacologic comfort measures Outcome: Not Progressing   Problem: Health Behavior/Discharge Planning: Goal: Ability to manage health-related needs will improve Outcome: Not Progressing   Problem: Clinical Measurements: Goal: Ability to maintain clinical measurements within normal limits will improve Outcome: Not Progressing   Problem: Clinical Measurements: Goal: Diagnostic test results will improve Outcome: Not Progressing   Problem: Clinical Measurements: Goal: Respiratory complications will improve Outcome: Not Progressing   Problem: Activity: Goal: Risk for activity intolerance will decrease Outcome: Not Progressing   Problem: Fluid Volume: Goal: Fluid volume balance will be maintained or improved Outcome: Not Progressing   Problem: Nutritional: Goal: Ability to make appropriate dietary choices will improve Outcome: Not Progressing   Problem: Respiratory: Goal: Respiratory symptoms related to disease process will be avoided Outcome: Not Progressing   Problem: Education: Goal: Ability to demonstrate management of disease process will improve Outcome: Not Progressing   Problem: Education: Goal: Ability to verbalize understanding of medication therapies will improve Outcome: Not Progressing   Problem: Activity: Goal: Capacity to carry out activities will improve Outcome: Not Progressing

## 2021-03-23 NOTE — Progress Notes (Signed)
Physical Therapy Treatment Patient Details Name: Phillip Young MRN: [id number] DOB: [birth year] Today's Date: 03/23/2021    History of Present Illness 35 y.o. year-old admitted 7/22 w/ SOB x 1 week. CHF exacerbation. Atrial w/ RVR may have been cause of decomp CHF per cardiology team.  ECHO showed EF <20%. BP's dropped and pt moved to ICU for suspected cardiogenic shock.CRRT started 7/24 and discontinued 8/10.  PMH:  severe LV/ RV heart failure, super morbid obesity, HTN,    PT Comments    Pt able to get to EOB and stand independently. Pt had agreed to amb in hallway. Pt turned vulgar music up on his phone. I asked pt to turn the music down/off because there were people and families on the unit that needed quiet. Pt stated, "It's freedom of speech." Explained to pt that it wasn't appropriate and that he would need to turn it down/off not to disturb others. Pt would not ambulate without. This pt is moving fairly well and I do not think PT has much left to offer him if he cannot follow basic courtesy to other pts in this environment. Will sign off at this time. Please reorder if pt more agreeable to cooperate.   Follow Up Recommendations  No PT follow up     Equipment Recommendations  None recommended by PT    Recommendations for Other Services       Precautions / Restrictions      Mobility  Bed Mobility Overal bed mobility: Modified Independent                  Transfers Overall transfer level: Modified independent Equipment used: None             General transfer comment: Incr time to rise. Uses momentum  Ambulation/Gait             General Gait Details: Pt would not turn down his loud, vulgar music to amb in hallway. He refused to amb without his music.   Stairs             Wheelchair Mobility    Modified Rankin (Stroke Patients Only)       Balance     Sitting balance-Leahy Scale: Normal     Standing balance support: No upper extremity  supported;During functional activity Standing balance-Leahy Scale: Good                              Cognition Arousal/Alertness: Awake/alert Behavior During Therapy: WFL for tasks assessed/performed Overall Cognitive Status: Within Functional Limits for tasks assessed                                        Exercises      General Comments        Pertinent Vitals/Pain      Home Living                      Prior Function            PT Goals (current goals can now be found in the care plan section) Progress towards PT goals: Not progressing toward goals - comment    Frequency           PT Plan Other (comment) (DC from PT)    Co-evaluation  AM-PAC PT "6 Clicks" Mobility   Outcome Measure  Help needed turning from your back to your side while in a flat bed without using bedrails?: None Help needed moving from lying on your back to sitting on the side of a flat bed without using bedrails?: None Help needed moving to and from a bed to a chair (including a wheelchair)?: None Help needed standing up from a chair using your arms (e.g., wheelchair or bedside chair)?: None Help needed to walk in hospital room?: None Help needed climbing 3-5 steps with a railing? : A Little 6 Click Score: 23    End of Session Equipment Utilized During Treatment: Oxygen Activity Tolerance: Other (comment) (Pt limited by his unwillingness to turn down his vulgar music to ambulate in hall.) Patient left: in bed;with call bell/phone within reach Nurse Communication: Mobility status;Other (comment) (unwillingness to perform hallway amb without loud vulgar music)       Time: [id number] PT Time Calculation (min) (ACUTE ONLY): 13 min  Charges:  $Therapeutic Activity: 8-22 mins                     [place] Pager [phone] Office [place] 03/23/2021, 3:11 PM

## 2021-03-23 NOTE — Progress Notes (Signed)
Patient ID: Phillip Young, male   DOB: Oct 31, 1985, 35 y.o.   MRN: [id number] S: Events of last night noted.  Pt would like to speak with dietician. O:BP 103/70 (BP Location: Right Leg)   Pulse 96   Temp 98.3 F (36.8 C)   Resp (!) 26   Ht '5\' 11"'$  (1.803 m)   Wt (!) 191 kg   SpO2 96%   BMI 58.73 kg/m   Intake/Output Summary (Last 24 hours) at 03/23/2021 0831 Last data filed at 03/23/2021 0800 Gross per 24 hour  Intake 1153.47 ml  Output 110 ml  Net 1043.47 ml   Intake/Output: I/O last 3 completed shifts: In: 2229.7 [P.O.:1674; I.V.:555.7] Out: 110 [Urine:110]  Intake/Output this shift:  Total I/O In: 8.5 [I.V.:8.5] Out: -  Weight change:  Gen: NAD CVS:IRR IRR Resp: CTA Abd: +BS, soft,NT/ND Ext: no edema  Recent Labs  Lab 03/20/21 0012 03/20/21 1600 03/21/21 0401 03/21/21 1635 03/22/21 0335 03/22/21 1600 03/23/21 0350  NA 123* 125* 126* 127* 125* 128* 126*  K 4.8 4.8 4.5 4.7 4.7 4.8 4.7  CL 90* 92* 91* 91* 88* 92* 91*  CO2 20* '22 24 25 22 25 23  '$ GLUCOSE 103* 112* 129* 113* 154* 119* 147*  BUN 42* 50* 32* 38* 43* 49* 55*  CREATININE 7.46* 8.37* 6.69* 8.15* 8.90* 9.19* 9.68*  ALBUMIN 2.8* 2.7* 3.0* 2.9* 2.8* 2.8* 2.8*  CALCIUM 8.9 9.2 9.3 9.2 9.5 9.2 9.4  PHOS 3.7 3.2 3.7 4.8* 4.4 5.3* 5.6*   Liver Function Tests: Recent Labs  Lab 03/22/21 0335 03/22/21 1600 03/23/21 0350  ALBUMIN 2.8* 2.8* 2.8*   No results for input(s): LIPASE, AMYLASE in the last 168 hours. No results for input(s): AMMONIA in the last 168 hours. CBC: Recent Labs  Lab 03/19/21 0454 03/20/21 0012 03/21/21 0401 03/22/21 0335 03/23/21 0350  WBC 9.3 9.8 10.2 9.9 9.1  HGB 8.8* 9.0* 9.3* 8.8* 8.9*  HCT 28.1* 28.2* 29.0* 28.7* 28.4*  MCV 94.6 93.4 93.9 96.0 95.9  PLT 216 220 250 275 288   Cardiac Enzymes: No results for input(s): CKTOTAL, CKMB, CKMBINDEX, TROPONINI in the last 168 hours. CBG: Recent Labs  Lab 03/22/21 0734 03/22/21 1108 03/22/21 1522 03/22/21 2235  03/23/21 0730  GLUCAP 198* 155* 122* 218* 100*    Iron Studies: No results for input(s): IRON, TIBC, TRANSFERRIN, FERRITIN in the last 72 hours. Studies/Results: No results found.  amiodarone  400 mg Oral BID   apixaban  5 mg Oral BID   Chlorhexidine Gluconate Cloth  6 each Topical Q0600   insulin aspart  0-5 Units Subcutaneous QHS   insulin aspart  0-9 Units Subcutaneous TID WC   midodrine  20 mg Oral TID WC   multivitamin  1 tablet Oral QHS   polyethylene glycol  17 g Oral Daily   senna-docusate  2 tablet Oral BID   sodium chloride flush  10-40 mL Intracatheter Q12H    BMET    Component Value Date/Time   NA 126 (L) 03/23/2021 0350   K 4.7 03/23/2021 0350   CL 91 (L) 03/23/2021 0350   CO2 23 03/23/2021 0350   GLUCOSE 147 (H) 03/23/2021 0350   BUN 55 (H) 03/23/2021 0350   CREATININE 9.68 (H) 03/23/2021 0350   CALCIUM 9.4 03/23/2021 0350   GFRNONAA 7 (L) 03/23/2021 0350   GFRAA >60 03/27/2020 1055   CBC    Component Value Date/Time   WBC 9.1 03/23/2021 0350   RBC 2.96 (L) 03/23/2021 0350  HGB 8.9 (L) 03/23/2021 0350   HCT 28.4 (L) 03/23/2021 0350   PLT 288 03/23/2021 0350   MCV 95.9 03/23/2021 0350   MCH 30.1 03/23/2021 0350   MCHC 31.3 03/23/2021 0350   RDW 19.0 (H) 03/23/2021 0350   LYMPHSABS 1.6 02/11/2021 1208   MONOABS 0.5 02/11/2021 1208   EOSABS 0.1 02/11/2021 1208   BASOSABS 0.1 02/11/2021 1208      Assessment/Plan:  AKI due to cardiogenic shock - in setting of a flutter with RVR and underlying severe biventricular CHF.  Pt started on CRRT and failed initial transition to IHD on 8/19.  Able to tolerate IHD on 03/18/21 with levophed and dobutamine.  Not responding to IV lasix.  Seen on HD today with plans to wean off of dobutamine per heart failure team and cardioversion 03/25/21.  If he is not able to tolerate IHD without pressors, there is nothing further we can offer.  He has declined hospice in the past. Acute on chronic Biventricular CHF -  as above  plan to stop dobutamine and decrease norepi and follow response Aflutter/fib with RVR - for DCCV 03/25/21, on amiodarone per Heart failure team. Hyponatremia - remains low despite IHD.  HIT + - on bivalirudin. Cardiogenic shock/hypotension - on dobutamine, levophed, midodrine. Disposition - if he fails to wean, will need palliative care assistance with EOL decisions.   Donetta Potts, MD [employer] [phone]

## 2021-03-23 NOTE — Progress Notes (Addendum)
Nutrition Follow-up  DOCUMENTATION CODES:   Morbid obesity  INTERVENTION:   Double portion of protein at meals   Continue Renal MVI  Reinforce fluid and salt restriction   NUTRITION DIAGNOSIS:   Increased nutrient needs related to acute illness as evidenced by estimated needs.  Ongoing  GOAL:   Patient will meet greater than or equal to 90% of their needs  Meeting   MONITOR:   PO intake, Supplement acceptance, Weight trends, Labs  REASON FOR ASSESSMENT:   Rounds (CRRT)    ASSESSMENT:   35 yo male admitted with acute respiratory failure with pulmonary edema with acute decompensated heart failure, cardiogenic shock. Pt requiring CRRT for volume removal. PMH includes biventricular heart failure, morbid obesity, HTN, GERD, OSA, pre-DM  Back in AF again. Repeat DCCV tomorrow. Undergoing HD. Remains on dobutamine and levophed. Plan to stop inotropes tomorrow and attempt HD without on Wednesday. Patient still having difficulty controlling fluid/sodium intake. If unable to tolerate HD without support, palliative to revisit GOC.   Of note, patient had bologna and cheese sandwich at bedside from family. RD discussed why this contributes to fluid status and discouraged intake of this for time being. Breakfast tray had three fruit cups with juice, jello, and milk on tray. RN to check trays prior to providing them to patient.   RD printed off handouts that included low sodium food items, high sodium food items, and fluid measurements of drinks/food. Went over each handout circling food/drink items patient that are low in sodium/fluid and items to avoid.   Last Monday weight: 187.9 kg Current weight: 188.5 kg   UOP: 110 ml x 24 hrs   Labs: sodium 126 (L) Phosphorus 5.6 (H) CBG 100-218 Meds: ss novolog, Rena-Vite, miralax, senokot Drips: levophed, doubutamine  Diet Order:   Diet Order             Diet NPO time specified  Diet effective midnight           Diet NPO time  specified  Diet effective midnight           Diet 2 gram sodium Room service appropriate? Yes; Fluid consistency: Thin; Fluid restriction: Other (see comments)  Diet effective now                   EDUCATION NEEDS:   Education needs have been addressed  Skin:  Skin Assessment: Reviewed RN Assessment  Last BM:  8/26  Height:   Ht Readings from Last 1 Encounters:  03/03/21 '5\' 11"'$  (1.803 m)    Weight:   Wt Readings from Last 1 Encounters:  03/23/21 (!) 188.5 kg    BMI:  Body mass index is 57.96 kg/m.  Estimated Nutritional Needs:   Kcal:  2300-3500 kcals  Protein:  150-170 g  Fluid:  500 ml fluid restriction per MD  Mariana Single MS, RD, LDN, CNSC Clinical Nutrition Pager listed in [place]

## 2021-03-24 ENCOUNTER — Encounter (HOSPITAL_COMMUNITY)
Admission: EM | Disposition: A | Discharge status: Home / Self Care | Payer: Self-pay | Source: Home / Self Care | Attending: Cardiology

## 2021-03-24 ENCOUNTER — Encounter (HOSPITAL_COMMUNITY): Payer: Self-pay | Admitting: Anesthesiology

## 2021-03-24 DIAGNOSIS — I5023 Acute on chronic systolic (congestive) heart failure: Secondary | ICD-10-CM | POA: Diagnosis not present

## 2021-03-24 LAB — GLUCOSE, CAPILLARY
Glucose-Capillary: 142 mg/dL — ABNORMAL HIGH (ref 70–99)
Glucose-Capillary: 96 mg/dL (ref 70–99)
Glucose-Capillary: 123 mg/dL — ABNORMAL HIGH (ref 70–99)
Glucose-Capillary: 110 mg/dL — ABNORMAL HIGH (ref 70–99)

## 2021-03-24 LAB — RENAL FUNCTION PANEL
Albumin: 3.2 g/dL — ABNORMAL LOW (ref 3.5–5.0)
Anion gap: 11 (ref 5–15)
BUN: 45 mg/dL — ABNORMAL HIGH (ref 6–20)
CO2: 24 mmol/L (ref 22–32)
Calcium: 9.7 mg/dL (ref 8.9–10.3)
Chloride: 93 mmol/L — ABNORMAL LOW (ref 98–111)
Creatinine, Ser: 8.89 mg/dL — ABNORMAL HIGH (ref 0.61–1.24)
GFR, Estimated: 7 mL/min — ABNORMAL LOW (ref >60)
Glucose, Bld: 106 mg/dL — ABNORMAL HIGH (ref 70–99)
Phosphorus: 5.8 mg/dL — ABNORMAL HIGH (ref 2.5–4.6)
Potassium: 5.3 mmol/L — ABNORMAL HIGH (ref 3.5–5.1)
Sodium: 128 mmol/L — ABNORMAL LOW (ref 135–145)

## 2021-03-24 LAB — CBC
HCT: 30.5 % — ABNORMAL LOW (ref 39.0–52.0)
Hemoglobin: 9.4 g/dL — ABNORMAL LOW (ref 13.0–17.0)
MCH: 29.7 pg (ref 26.0–34.0)
MCHC: 30.8 g/dL (ref 30.0–36.0)
MCV: 96.5 fL (ref 80.0–100.0)
Platelets: 291 10*3/uL (ref 150–400)
RBC: 3.16 MIL/uL — ABNORMAL LOW (ref 4.22–5.81)
RDW: 19.7 % — ABNORMAL HIGH (ref 11.5–15.5)
WBC: 9 10*3/uL (ref 4.0–10.5)
nRBC: 0.2 % (ref 0.0–0.2)

## 2021-03-24 LAB — MAGNESIUM: Magnesium: 2.2 mg/dL (ref 1.7–2.4)

## 2021-03-24 LAB — COOXEMETRY PANEL
Carboxyhemoglobin: 1.2 % (ref 0.5–1.5)
Methemoglobin: 0.9 % (ref 0.0–1.5)
O2 Saturation: 44.5 %
Total hemoglobin: 10.9 g/dL — ABNORMAL LOW (ref 12.0–16.0)

## 2021-03-24 SURGERY — CANCELLED PROCEDURE

## 2021-03-24 MED ORDER — SODIUM ZIRCONIUM CYCLOSILICATE 10 G PO PACK
10.0000 g | PACK | Freq: Every day | ORAL | Status: DC
  Administered 2021-03-24: 10 g via ORAL
  Filled 2021-03-24 (×3): qty 1

## 2021-03-24 NOTE — Progress Notes (Signed)
Patient ID: Phillip Young, male   DOB: July 27, 1985, 35 y.o.   MRN: [id number] S: no complaints this am.  Has been weaned off of pressors this morning. O:BP 106/73 (BP Location: Right Leg)   Pulse 81   Temp (!) 97.5 F (36.4 C) (Oral)   Resp (!) 23   Ht '5\' 11"'$  (1.803 m)   Wt (!) 189.1 kg   SpO2 94%   BMI 58.14 kg/m   Intake/Output Summary (Last 24 hours) at 03/24/2021 1027 Last data filed at 03/24/2021 0800 Gross per 24 hour  Intake 513.74 ml  Output 5000 ml  Net -4486.26 ml   Intake/Output: I/O last 3 completed shifts: In: 1257.9 [P.O.:1036; I.V.:221.9] Out: [id number] [Urine:110; Other:5000]  Intake/Output this shift:  Total I/O In: 1.3 [I.V.:1.3] Out: -  Weight change: 1.6 kg [id number], NAD CVS:IRR IRR Resp:CTA Abd:+BS, soft, NT [id number] pretibial edema  Recent Labs  Lab 03/21/21 0401 03/21/21 1635 03/22/21 0335 03/22/21 1600 03/23/21 0350 03/23/21 1600 03/24/21 0600  NA 126* 127* 125* 128* 126* 130* 128*  K 4.5 4.7 4.7 4.8 4.7 4.6 5.3*  CL 91* 91* 88* 92* 91* 94* 93*  CO2 '24 25 22 25 23 25 24  '$ GLUCOSE 129* 113* 154* 119* 147* 122* 106*  BUN 32* 38* 43* 49* 55* 34* 45*  CREATININE 6.69* 8.15* 8.90* 9.19* 9.68* 7.38* 8.89*  ALBUMIN 3.0* 2.9* 2.8* 2.8* 2.8* 3.1* 3.2*  CALCIUM 9.3 9.2 9.5 9.2 9.4 9.2 9.7  PHOS 3.7 4.8* 4.4 5.3* 5.6* 4.0 5.8*   Liver Function Tests: Recent Labs  Lab 03/23/21 0350 03/23/21 1600 03/24/21 0600  ALBUMIN 2.8* 3.1* 3.2*   No results for input(s): LIPASE, AMYLASE in the last 168 hours. No results for input(s): AMMONIA in the last 168 hours. CBC: Recent Labs  Lab 03/20/21 0012 03/21/21 0401 03/22/21 0335 03/23/21 0350 03/24/21 0600  WBC 9.8 10.2 9.9 9.1 9.0  HGB 9.0* 9.3* 8.8* 8.9* 9.4*  HCT 28.2* 29.0* 28.7* 28.4* 30.5*  MCV 93.4 93.9 96.0 95.9 96.5  PLT 220 250 275 288 291   Cardiac Enzymes: No results for input(s): CKTOTAL, CKMB, CKMBINDEX, TROPONINI in the last 168 hours. CBG: Recent Labs  Lab 03/23/21 0730  03/23/21 1114 03/23/21 1616 03/23/21 2108 03/24/21 0643  GLUCAP 100* 132* 128* 115* 142*    Iron Studies: No results for input(s): IRON, TIBC, TRANSFERRIN, FERRITIN in the last 72 hours. Studies/Results: No results found.  amiodarone  400 mg Oral BID   apixaban  5 mg Oral BID   Chlorhexidine Gluconate Cloth  6 each Topical Q0600   insulin aspart  0-5 Units Subcutaneous QHS   insulin aspart  0-9 Units Subcutaneous TID WC   midodrine  20 mg Oral TID WC   multivitamin  1 tablet Oral QHS   polyethylene glycol  17 g Oral Daily   senna-docusate  2 tablet Oral BID   sodium chloride flush  10-40 mL Intracatheter Q12H    BMET    Component Value Date/Time   NA 128 (L) 03/24/2021 0600   K 5.3 (H) 03/24/2021 0600   CL 93 (L) 03/24/2021 0600   CO2 24 03/24/2021 0600   GLUCOSE 106 (H) 03/24/2021 0600   BUN 45 (H) 03/24/2021 0600   CREATININE 8.89 (H) 03/24/2021 0600   CALCIUM 9.7 03/24/2021 0600   GFRNONAA 7 (L) 03/24/2021 0600   GFRAA >60 03/27/2020 1055   CBC    Component Value Date/Time   WBC 9.0 03/24/2021 0600   RBC 3.16 (L)  03/24/2021 0600   HGB 9.4 (L) 03/24/2021 0600   HCT 30.5 (L) 03/24/2021 0600   PLT 291 03/24/2021 0600   MCV 96.5 03/24/2021 0600   MCH 29.7 03/24/2021 0600   MCHC 30.8 03/24/2021 0600   RDW 19.7 (H) 03/24/2021 0600   LYMPHSABS 1.6 02/11/2021 1208   MONOABS 0.5 02/11/2021 1208   EOSABS 0.1 02/11/2021 1208   BASOSABS 0.1 02/11/2021 1208    Assessment/Plan:   AKI due to cardiogenic shock - in setting of a flutter with RVR and underlying severe biventricular CHF.  Pt started on CRRT and failed initial transition to IHD on 8/19.  Able to tolerate IHD on 03/18/21 with levophed and dobutamine.  Not responding to IV lasix.   Plan for HD tomorrow off of pressors and see if he can tolerate UF. If he is not able to tolerate IHD without pressors, there is nothing further we can offer.  He has declined hospice and believes that he will tolerate HD. If he  does tolerate HD will start looking for outpatient HD for AKI but not sure his insurance will pay for it.  Will need CM/SW assistance with placement. Will need tunneled HD catheter as well, will consult IR. Acute on chronic Biventricular CHF -  as above plan to stop dobutamine and decrease norepi and follow response Aflutter/fib with RVR - for DCCV 03/25/21, on amiodarone per Heart failure team. Hyponatremia - remains low despite IHD.  Poor prognostic indicator. Hyperkalemia - will give lokelma and plan for HD tomorrow. HIT + - on bivalirudin. Cardiogenic shock/hypotension - on dobutamine, levophed, midodrine. Disposition - if he fails to wean, will need palliative care assistance with EOL decisions.   Donetta Potts, MD [employer] [phone]

## 2021-03-24 NOTE — Anesthesia Preprocedure Evaluation (Deleted)
Anesthesia Evaluation    Reviewed: Allergy & Precautions, Patient's Chart, lab work & pertinent test results  Airway        Dental   Pulmonary sleep apnea , former smoker,           Cardiovascular hypertension, +CHF  + dysrhythmias Atrial Fibrillation   Acute on Chronic Biventricular Heart Failure:  Known cardiomyopathy since 2019, previously followed by [facility].  Echo 02/27/2020 with EF < 20%, moderate LV dilation, severely decreased RV function with severe RV dilation, severe biatrial enlargement, mild MR. No history of ETOH/drugs.  No FH of cardiomyopathy (mother with [place]).  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  He is too large for cardiac MRI. RHC/LHC on 03/04/20 with no CAD (nonischemic cardiomyopathy), elevated left and right heart filling pressures, and low CI at 2.1. 05/2020 Echo showed EF 20-25%. Evaluated by EP but not a candidate for ICD given BMI.    Neuro/Psych negative neurological ROS  negative psych ROS   GI/Hepatic Neg liver ROS, GERD  ,  Endo/Other  Morbid obesity  Renal/GU negative Renal ROS     Musculoskeletal negative musculoskeletal ROS (+)   Abdominal   Peds  Hematology negative hematology ROS (+)   Anesthesia Other Findings   Reproductive/Obstetrics                             Anesthesia Physical Anesthesia Plan  ASA: 4  Anesthesia Plan: General   Post-op Pain Management:    Induction: Intravenous  PONV Risk Score and Plan: Propofol infusion  Airway Management Planned: Natural Airway  Additional Equipment:   Intra-op Plan:   Post-operative Plan:   Informed Consent:   Plan Discussed with: Anesthesiologist and CRNA  Anesthesia Plan Comments:         Anesthesia Quick Evaluation

## 2021-03-24 NOTE — Progress Notes (Signed)
Patient is on 6 liters high flow oxygen. Checking with the clinic to see if this can be provided.

## 2021-03-24 NOTE — Progress Notes (Addendum)
Patient ID: Phillip Young, male   DOB: October 18, 1985, 35 y.o.   MRN: [id number]      Advanced Heart Failure Rounding Note  PCP-Cardiologist: Dr. [name]  Subjective:    Remains on norepinephrine at 3. Co-ox of 44.5% and CVP of 10-11.  Weight up one pound today, but down 9 pounds after dialysis yesterday.  Oxygen down to 8L on exam, then turned down to 6 per patient request.      Per RN, consumed two fruit cups at 0600 -- patient adamantly denies, however.   Afib in 100s noted on telemetry.  He is sitting up in bed in no mild distress.  He says he's ready to give up and wants to be discharged today.     Objective:   Weight Range: (!) 189.1 kg Body mass index is 58.14 kg/m.   Vital Signs:   Temp:  [97.5 F (36.4 C)-98.6 F (37 C)] 97.5 F (36.4 C) (08/30 0300) Pulse Rate:  [76-224] 80 (08/30 0600) Resp:  [13-35] 27 (08/30 0600) BP: (78-157)/(35-143) 157/143 (08/30 0445) SpO2:  [69 %-100 %] 95 % (08/30 0600) Weight:  [188.5 kg-189.1 kg] 189.1 kg (08/30 0630) Last BM Date: 03/20/21  Weight change: Filed Weights   03/23/21 0715 03/23/21 1300 03/24/21 0630  Weight: (!) 192.6 kg (!) 188.5 kg (!) 189.1 kg    Intake/Output:   Intake/Output Summary (Last 24 hours) at 03/24/2021 0715 Last data filed at 03/24/2021 0700 Gross per 24 hour  Intake 620.91 ml  Output 5000 ml  Net -4379.09 ml      Physical Exam   General: NAD Neck: JVP difficult, no thyromegaly or thyroid nodule.  Lungs: Clear to auscultation bilaterally with normal respiratory effort. CV: Nonpalpable PMI.  Heart mildly tachy, irregular S1/S2, no S3/S4, no murmur.  Trace ankle edema.  Abdomen: Soft, nontender, no hepatosplenomegaly, no distention.  Skin: Intact without lesions or rashes.  Neurologic: Alert and oriented x 3.  Psych: Normal affect. Extremities: No clubbing or cyanosis.  HEENT: Normal.   Telemetry   AF 100s. Personally reviewed   Labs    CBC Recent Labs    03/23/21 0350 03/24/21 0600   WBC 9.1 9.0  HGB 8.9* 9.4*  HCT 28.4* 30.5*  MCV 95.9 96.5  PLT 288 [id number]    Basic Metabolic Panel Recent Labs    03/23/21 0350 03/23/21 1600 03/24/21 0600  NA 126* 130* 128*  K 4.7 4.6 5.3*  CL 91* 94* 93*  CO2 '23 25 24  '$ GLUCOSE 147* 122* 106*  BUN 55* 34* 45*  CREATININE 9.68* 7.38* 8.89*  CALCIUM 9.4 9.2 9.7  MG 2.3  --  2.2  PHOS 5.6* 4.0 5.8*    Liver Function Tests Recent Labs    03/23/21 1600 03/24/21 0600  ALBUMIN 3.1* 3.2*     No results for input(s): LIPASE, AMYLASE in the last 72 hours. Cardiac Enzymes No results for input(s): CKTOTAL, CKMB, CKMBINDEX, TROPONINI in the last 72 hours.  BNP: BNP (last 3 results) Recent Labs    10/10/20 1336 11/07/20 0932 02/11/21 1500  BNP 126.9* 106.0* 194.3*     ProBNP (last 3 results) No results for input(s): PROBNP in the last 8760 hours.   D-Dimer No results for input(s): DDIMER in the last 72 hours.  Hemoglobin A1C No results for input(s): HGBA1C in the last 72 hours.  Fasting Lipid Panel No results for input(s): CHOL, HDL, LDLCALC, TRIG, CHOLHDL, LDLDIRECT in the last 72 hours. Thyroid Function Tests No results for input(s):  TSH, T4TOTAL, T3FREE, THYROIDAB in the last 72 hours.  Invalid input(s): FREET3   Other results:   Imaging    No results found.   Medications:     Scheduled Medications:  amiodarone  400 mg Oral BID   apixaban  5 mg Oral BID   Chlorhexidine Gluconate Cloth  6 each Topical Q0600   insulin aspart  0-5 Units Subcutaneous QHS   insulin aspart  0-9 Units Subcutaneous TID WC   midodrine  20 mg Oral TID WC   multivitamin  1 tablet Oral QHS   polyethylene glycol  17 g Oral Daily   senna-docusate  2 tablet Oral BID   sodium chloride flush  10-40 mL Intracatheter Q12H    Infusions:  sodium chloride Stopped (03/14/21 1254)   sodium chloride     sodium chloride     sodium chloride     anticoagulant sodium citrate     norepinephrine (LEVOPHED) Adult infusion 3  mcg/min (03/24/21 0700)    PRN Medications: sodium chloride, sodium chloride, sodium chloride, sodium chloride, acetaminophen **OR** acetaminophen, alteplase, anticoagulant sodium citrate, camphor-menthol, diphenhydrAMINE, Gerhardt's butt cream, guaiFENesin-dextromethorphan, lidocaine, LORazepam, menthol-cetylpyridinium, ondansetron (ZOFRAN) IV, oxyCODONE, sodium chloride, sodium chloride  Assessment/Plan   1. Acute on Chronic Biventricular Heart Failure -> cardiogenic shock:  Known NICM since 2019, previously followed by [facility].  Echo 8/21 EF < 20%, moderate LV dilation, severely decreased RV function, severe biatrial enlargement, mild MR. No history of ETOH/drugs.  No FH of cardiomyopathy (mother with [place]).  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  He is too large for cardiac MRI. RHC/LHC on 03/04/20 with no CAD low CI at 2.1. Evaluated by EP but not a candidate for ICD given BMI.  Echo this admission with EF < 20%, RV poorly visualized.  Developed cardiogenic shock -> AKI in setting of AFL with RVR. Dobutamine started at 2.5 and increased to 7.5.  NE added to support CVVHD. He was transitioned to [facility] but unable to tolerate this with rising CVP and respiratory distress, inability to comply with fluid restriction.  Restarted CVVH for additional fluid removal, but again transitioned to iHD. CVP 10-11 this morning and a nine pound weight loss noted after dialysis yesterday, but one pound gain this morning.  Co-ox of 45% on NE of 3 since yesterday. - I have explained to him that there is no way he will be able to be maintained with outpatient iHD given his current situation of low output and non-compliance. The best option now is likely hospice but he refuses.  - NE off this morning  - DCCV planned for this morning; however, patient ate at 0600 -- rescheduled until 1430 per endo scheduling.  I suspect he will fail iHD unless he can control his intake  which he has been unsuccessful with so far. Nursing reports finding food/drinks hidden in room multiple times. - With AKI/hypotension, stopped Entresto, dapagliflozin, spironolactone, digoxin.  2. Aflutter/fibrillation w/ RVR:  Noted to be in Afib during a prior admission in 3/22 in the setting of CAP and a/c CHF, but converted to NSR on amiodarone. Discharged home w/ eliquis. Was in 2:1 AFL this admit in setting of a/c CHF w/ marked volume overload, + recent gastroenteritis w/ diarrhea. Has OSA but not using CPAP. TSH WNL. Had DCCV on 8/5 and again 8/22. Back in AF over the weekend.   -  Will continue PO amiodarone 400 mg BID to avoid volume load with IV.   -  Continue Eliquis.  - Will attempt DCCV this afternoon off pressors  3. AKI on CKD IV: Likely ATN/cardiorenal.CVVHD stopped on 8/9 after catheter clotted.  iHD started on 8/11 with 2L of volume removed. Trialysis cath switched 8/12. Tolerated iHD on 8/13 and had dialysis catheter placed 8/16.  On 8/16, CVVH restarted with respiratory distress.  He has been transitioned to [facility] for a 2nd time.  CVP 10-11 today - was 14 prior to dialysis yesterday - At this point, with refusal of hospice care, only option will be weaning him off dobutamine and NE and and doing the best we can with iHD.  As above, will try to aim for home after Wednesday HD.  However, future for him looks pretty bleak.  4. Acute Hypoxic Respiratory Failure: Suspect underlying OHS/OSA exacerbated by CHF this admission. Now on nasal cannula.  - Continues to refuse BIPAP at night - Volume management complicated by noncompliance 5. Type 2 DM: Farxiga held with AKI.  - continue SSI 6. OSA: Has not been on Bipap at home.  7. Super Morbid Obesity: Body mass index is 58.14 kg/m. 8. UTI: Suspect by UA, sent urine culture.  - Ceftriaxone IV course completed.  9. Hyponatremia: Improved with CVVH 123 -> 126 -> 125 -> 126 --> 130 --> 128 - He has been counseled on free water restriction  numerous times. This remains an issue.  10. ID: With fever, started empirically on vancomycin/cefepime.  Cultures NGTD.  CVL removed and PICC placed. He completed 5 days empiric abx.  11. 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 Platelets recovered.  SRA+.  - now back on Eliquis. Hgb stable and is 9.4 today    KIM CAROLLO, AGACNP-BC  Patient seen with NP, agree with the above note.    Patient tolerated HD yesterday, NE down to 3 and off dobutamine.  5L off, CVP down to 10-11.  He remains in atrial fibrillation rate 100s.   Patient was NPO for DCCV this morning but ate 2 fruit cups that he had hidden.   General: NAD Neck: Thick, JVP difficult, no thyromegaly or thyroid nodule.  Lungs: Clear to auscultation bilaterally with normal respiratory effort. CV: Nondisplaced PMI.  Heart irregular S1/S2, no S3/S4, no murmur.  Trace ankle edema.  No carotid bruit.  Normal pedal pulses.  Abdomen: Soft, nontender, no hepatosplenomegaly, no distention.  Skin: Intact without lesions or rashes.  Neurologic: Alert and oriented x 3.  Psych: Normal affect. Extremities: No clubbing or cyanosis.  HEENT: Normal.   MAP stable, will stop NE today.  He will need to be able to tolerate iHD tomorrow off NE and dobutamine.   Oxygen turned down to 6L today, continue to wean down.   DCCV rescheduled to this afternoon as he ate early this morning (concealed fruit cups).   If patient does not tolerate iHD off pressors/inotropes and cannot maintain volume this way, we have no other options.  I do not believe that he would obtain long-term benefit by going back on CVVH.  He has refused hospice/palliative care.  I have obtained ethics consult in the case that we hit a point where patient is demanding care that we believe is futile.   CRITICAL CARE Performed by: [name]  Total critical care time: 35 minutes  Critical care time was exclusive of separately  billable procedures and treating other patients.  Critical care was necessary to treat or prevent imminent or life-threatening deterioration.  Critical care was  time spent personally by me on the following activities: development of treatment plan with patient and/or surrogate as well as nursing, discussions with consultants, evaluation of patient's response to treatment, examination of patient, obtaining history from patient or surrogate, ordering and performing treatments and interventions, ordering and review of laboratory studies, ordering and review of radiographic studies, pulse oximetry and re-evaluation of patient's condition.  [name] 03/24/2021 7:45 AM

## 2021-03-24 NOTE — Progress Notes (Signed)
Called into room by pt around 1000 after CVP line became disconnected from PICC line while pt was on the bedside commode. A small amount of blood was present on the floor and across the sheets. He reconnected the tubing and bleeding was no longer present - cleaned tubing, cleared out air, and checked PICC line prior to reconnecting tubing. Changed linens and asked if pt wanted to be wiped down but refused until later in the afternoon.  Told patient I needed to give him his medications and he stated that he did not want to take any medication because he is NPO for cardioversion today. Spoke with endo RN and was told patient had to at least take his eliquis in order to be cardioverted.  Gave pt about 60 mL of water and asked him to take the eliquis, amiodarone, and midodrine (7 pills total). He states he was not able to take all of the medications without more water. I asked if he would at least take the amio and eliquis (3 pills) in order to get the cardioversion today. Pt stated he wants to take all of his medications or none of them. I again told him he could not have anymore water and he said he shrugged his shoulders and would not take the medications. I explained that this meant the cardioversion would be canceled and hospice would be the result, as Dr. [name] told the patient this morning.  Pt then accused me of "threatening hospice" and became increasingly hostile and aggressive. I told him that he was being manipulative in picking which parts of his care that he would be compliant with or not. Attempted to reason with patient but unable to deescalate the situation and left the room.   Called Thurnell Lose, NP to assist with situation. [name] mother on speaker phone while Maudie Mercury was talking to discuss the issue. [name] mother became angry and speaking very loudly saying "he wants to be compliant so you need to call the doctor to get an order saying he can have more liquid to get his pills down". At this point,  I removed myself from the situation.  Pt yelled out, "I want a new nurse" as I was leaving the room. Reported to Minette Brine, charge RN who alerted Nunzio Cory, Therapist, sports to the situation.   Pt took all medications required for cardioversion today.

## 2021-03-24 NOTE — TOC CM/SW Note (Signed)
HF TOC CM spoke to pt and discussed dc to home. Explained outpt HD referral was sent for outpt HD center. Discussed Trilogy/NIV at dc, will have attending complete order and will have set up with [place]. Pt verbalized understanding. Spoke with [employer] disability and paperwork needs to be submitted again. The claim number on form was for an old claim in March. Pt was not able to locate claim number for this hospital stay. Will submit to HF Clinic RN to complete and resubmit. Waiting on information about his dialysis center. [place], Heart Failure TOC CM [phone]

## 2021-03-24 NOTE — Progress Notes (Signed)
Out Patient Arrangements:  Have been requested to arrange out pt HD for pt. Have submitted medical records to [facility] admissions. Please advise of d/c date.   Linus Orn HPSS [phone]

## 2021-03-24 NOTE — TOC Progression Note (Addendum)
Transition of Care (TOC) - Progression Note  Heart Failure   Patient Details  Name: Phillip Young MRN: [id number] Date of Birth: 1986-03-03  Transition of Care [facility]) CM/SW [place], [place] Phone Number: 03/24/2021, 11:04 AM  Clinical Narrative:    Mr. Gilday is for a possible discharge tomorrow if he is medically stable and able to establish outpatient HD. CSW reached out to the temporary Renal Navigator Marya Amsler [phone] for assistance in locating outpatient HD and Marya Amsler reported that he will arrange HD and let the CSW know today.   CSW will continue to follow throughout discharge.   Expected Discharge Plan: Home/Self Care Barriers to Discharge: Continued Medical Work up  Expected Discharge Plan and Services Expected Discharge Plan: Home/Self Care In-house Referral: Clinical Social Work Discharge Planning Services: CM Consult, HF Clinic   Living arrangements for the past 2 months: Single Family Home                                       Social Determinants of Health (SDOH) Interventions Food Insecurity Interventions: Assist with [employer] Application Financial Strain Interventions: Other (Comment) (Referral to [facility]) Housing Interventions: Intervention Not Indicated Transportation Interventions: Intervention Not Indicated  Readmission Risk Interventions Readmission Risk Prevention Plan 10/16/2020 10/16/2020 10/14/2020  Transportation Screening Complete Complete Complete  PCP or Specialist Appt within 5-7 Days Complete Complete Complete  Home Care Screening Complete - -  Medication Review (RN CM) Referral to Pharmacy - -  Some recent data might be hidden   Karla Pavone, MSW, [place] Heart Failure Social Worker

## 2021-03-24 NOTE — Progress Notes (Signed)
Gave report to RN taking over for the remainder of the shift.  Was sitting at the nurses station documenting and saw pt standing in room talking to nurse tech outside room. I looked up and he said to the nurse tech, "no, not you, she's a bitch" while pointing at me. It was completely unprovoked and I said, "really?". He pointed his phone camera at me as if he was recording, I said, "you don't get to speak to anyone that way" and he said "it's freedom of speech". I walked away immediately.  Reported to Therapist, sports and Camera operator.

## 2021-03-24 NOTE — Plan of Care (Signed)
  Problem: Respiratory: Goal: Respiratory symptoms related to disease process will be avoided Outcome: Not Progressing   Problem: Cardiac: Goal: Ability to achieve and maintain adequate cardiopulmonary perfusion will improve Outcome: Not Progressing   Problem: Activity: Goal: Capacity to carry out activities will improve Outcome: Not Progressing   Problem: Urinary Elimination: Goal: Progression of disease will be identified and treated Outcome: Not Progressing   Problem: Education: Goal: Ability to demonstrate management of disease process will improve Outcome: Not Progressing Goal: Ability to verbalize understanding of medication therapies will improve Outcome: Not Progressing

## 2021-03-24 NOTE — Progress Notes (Signed)
Called to bedside by RN, who reports that patient refused his scheduled meds (Eliquis, midodrine and amiodarone) because he says he could not swallow the pills with such a small amount of water.  The patient was very angry and with his mother on the phone, continued to ask how can he take the meds he needs to take if he can't swallow them.  His mother then insisted on him getting enough water so he can take the medications he needs to take.  It was reiterated to both that if his fluid intake is too high, the cardioversion would be cancelled.  At first, the agreement was for him to only take the Eliquis and amiodarone with the water he had at bedside; however, after discussion with Dr. [name], the midodrine was necessary.  Agreed to give him a small amount of fluid to take the midodrine, which he did.

## 2021-03-24 NOTE — Progress Notes (Signed)
Pt aware of npo orders for am procedure. Pt  eats 2 cups of fruit at 0600. Discussed with pt respiratory risk and implications of non-compliance to npo status before procedures. Anesthesia dept and Md notified. Procedure to be rescheduled for pm per anesthesia.

## 2021-03-25 LAB — RENAL FUNCTION PANEL
Albumin: 3.1 g/dL — ABNORMAL LOW (ref 3.5–5.0)
Anion gap: 13 (ref 5–15)
BUN: 58 mg/dL — ABNORMAL HIGH (ref 6–20)
CO2: 22 mmol/L (ref 22–32)
Calcium: 9.6 mg/dL (ref 8.9–10.3)
Chloride: 94 mmol/L — ABNORMAL LOW (ref 98–111)
Creatinine, Ser: 10.66 mg/dL — ABNORMAL HIGH (ref 0.61–1.24)
GFR, Estimated: 6 mL/min — ABNORMAL LOW (ref >60)
Glucose, Bld: 128 mg/dL — ABNORMAL HIGH (ref 70–99)
Phosphorus: 6.8 mg/dL — ABNORMAL HIGH (ref 2.5–4.6)
Potassium: 5.3 mmol/L — ABNORMAL HIGH (ref 3.5–5.1)
Sodium: 129 mmol/L — ABNORMAL LOW (ref 135–145)

## 2021-03-25 LAB — GLUCOSE, CAPILLARY
Glucose-Capillary: 139 mg/dL — ABNORMAL HIGH (ref 70–99)
Glucose-Capillary: 108 mg/dL — ABNORMAL HIGH (ref 70–99)
Glucose-Capillary: 90 mg/dL (ref 70–99)
Glucose-Capillary: 162 mg/dL — ABNORMAL HIGH (ref 70–99)

## 2021-03-25 LAB — COOXEMETRY PANEL
Carboxyhemoglobin: 1.4 % (ref 0.5–1.5)
Methemoglobin: 1 % (ref 0.0–1.5)
O2 Saturation: 58 %
Total hemoglobin: 9.2 g/dL — ABNORMAL LOW (ref 12.0–16.0)

## 2021-03-25 LAB — CBC
HCT: 28.8 % — ABNORMAL LOW (ref 39.0–52.0)
Hemoglobin: 9 g/dL — ABNORMAL LOW (ref 13.0–17.0)
MCH: 30.6 pg (ref 26.0–34.0)
MCHC: 31.3 g/dL (ref 30.0–36.0)
MCV: 98 fL (ref 80.0–100.0)
Platelets: 297 10*3/uL (ref 150–400)
RBC: 2.94 MIL/uL — ABNORMAL LOW (ref 4.22–5.81)
RDW: 20 % — ABNORMAL HIGH (ref 11.5–15.5)
WBC: 9.2 10*3/uL (ref 4.0–10.5)
nRBC: 0 % (ref 0.0–0.2)

## 2021-03-25 LAB — MAGNESIUM: Magnesium: 2.4 mg/dL (ref 1.7–2.4)

## 2021-03-25 MED ORDER — LANTHANUM CARBONATE 500 MG PO CHEW
500.0000 mg | CHEWABLE_TABLET | Freq: Three times a day (TID) | ORAL | Status: DC
  Administered 2021-03-25 – 2021-03-26 (×3): 500 mg via ORAL
  Filled 2021-03-25 (×5): qty 1

## 2021-03-25 NOTE — TOC Benefit Eligibility Note (Signed)
Transition of Care [facility]) Benefit Eligibility Note    Patient Details  Name: Maciel Wyllie MRN: [id number] Date of Birth: 03-Mar-1986   Medication/Dose: Eliquis '5mg'$ . bid  30 day supply (ELIQUIS doesnot come in 10 mg.)  Covered?: Yes  Tier: 3 Drug  Prescription Coverage Preferred Pharmacy: [facility] with Person/Company/Phone Number:: Jasmekka C. W/ [employer]# [id number]  Co-Pay: ZERO  Prior Approval: No (NO DEDUCTIBLE)          Shelda Altes Phone Number: 03/25/2021, 10:47 AM

## 2021-03-25 NOTE — Progress Notes (Signed)
Patient refused Lokelma ordered, states he does not want to take his leisure fluids with medications and would take [facility] if I gave him another juice to mix it in. I informed the patient if Potassium gets to high it would stop his heart and that I could not go against doctors orders and give him more fluid than ordered. The patient then said, "I'll take my chances". Case Manager Beryle Beams also tried to educate patient but was unsuccessful with influencing him to comply with his fluid restriction and medication regimen,  Consuella Lose, RN

## 2021-03-25 NOTE — Progress Notes (Addendum)
Patient ID: Phillip Young, male   DOB: 27-Oct-1985, 35 y.o.   MRN: [id number]      Advanced Heart Failure Rounding Note  PCP-Cardiologist: Dr. [name]  Subjective:    Off inotropy since yesterday morning with stable and intermittently borderline blood pressures.  He converted to NSR spontaneously yesterday and remains in SR today.  Co-ox 58% today with CVP of 14-16.  Weight down 3 pounds and oxygen requirement stable at 6L.   Intake charted at ~650 mL.  As noted, yesterday was difficult in terms of fluid and food restrictions.    Na of 129 (128 yesterday) and K of 5.2.  Nephrology ordered a dose of Lokelma yesterday for a K of 5.3, but it was not given due to his initial strict PO restrictions in anticipation of cardioversion, then due to his agitation.   Will attempt iHD off hemodynamic support for the first time.    He is sitting up in bed in distress and says he feels remorseful about his impulsivity and unkindness toward staff.  He is very hopeful for discharge home today.    Objective:   Weight Range: (!) 187.5 kg Body mass index is 57.65 kg/m.   Vital Signs:   Temp:  [97.9 F (36.6 C)-98.4 F (36.9 C)] 98.4 F (36.9 C) (08/31 0640) Pulse Rate:  [58-120] 85 (08/31 0455) Resp:  [12-29] 20 (08/31 0455) BP: (75-135)/(45-116) 109/56 (08/31 0455) SpO2:  [91 %-99 %] 91 % (08/31 0455) Weight:  [187.5 kg] 187.5 kg (08/31 0438) Last BM Date: 03/24/21  Weight change: Filed Weights   03/23/21 1300 03/24/21 0630 03/25/21 0438  Weight: (!) 188.5 kg (!) 189.1 kg (!) 187.5 kg    Intake/Output:   Intake/Output Summary (Last 24 hours) at 03/25/2021 0708 Last data filed at 03/25/2021 0400 Gross per 24 hour  Intake 668.32 ml  Output --  Net 668.32 ml      Physical Exam   General: NAD Neck: JVP difficult, no thyromegaly or thyroid nodule.  Lungs: Crackles in left base.  Normal respiratory effort. CV: Nonpalpable PMI.  Regular rate and rhythm.  No S3 or S4, no murmur.  Trace  ankle edema  Abdomen: Soft, nontender, no hepatosplenomegaly, no distention.  Skin: Intact without lesions or rashes.  Neurologic: Alert and oriented x 3.  Psych: Normal affect. Extremities: No clubbing or cyanosis.  HEENT: Normal.   Telemetry   SR in 80s. Personally reviewed   Labs    CBC Recent Labs    03/24/21 0600 03/25/21 0431  WBC 9.0 9.2  HGB 9.4* 9.0*  HCT 30.5* 28.8*  MCV 96.5 98.0  PLT 291 [id number]    Basic Metabolic Panel Recent Labs    03/24/21 0600 03/25/21 0431  NA 128* 129*  K 5.3* 5.3*  CL 93* 94*  CO2 24 22  GLUCOSE 106* 128*  BUN 45* 58*  CREATININE 8.89* 10.66*  CALCIUM 9.7 9.6  MG 2.2 2.4  PHOS 5.8* 6.8*    Liver Function Tests Recent Labs    03/24/21 0600 03/25/21 0431  ALBUMIN 3.2* 3.1*     No results for input(s): LIPASE, AMYLASE in the last 72 hours. Cardiac Enzymes No results for input(s): CKTOTAL, CKMB, CKMBINDEX, TROPONINI in the last 72 hours.  BNP: BNP (last 3 results) Recent Labs    10/10/20 1336 11/07/20 0932 02/11/21 1500  BNP 126.9* 106.0* 194.3*     ProBNP (last 3 results) No results for input(s): PROBNP in the last 8760 hours.   D-Dimer No  results for input(s): DDIMER in the last 72 hours.  Hemoglobin A1C No results for input(s): HGBA1C in the last 72 hours.  Fasting Lipid Panel No results for input(s): CHOL, HDL, LDLCALC, TRIG, CHOLHDL, LDLDIRECT in the last 72 hours. Thyroid Function Tests No results for input(s): TSH, T4TOTAL, T3FREE, THYROIDAB in the last 72 hours.  Invalid input(s): FREET3   Other results:   Imaging    No results found.   Medications:     Scheduled Medications:  amiodarone  400 mg Oral BID   apixaban  5 mg Oral BID   Chlorhexidine Gluconate Cloth  6 each Topical Q0600   insulin aspart  0-5 Units Subcutaneous QHS   insulin aspart  0-9 Units Subcutaneous TID WC   midodrine  20 mg Oral TID WC   multivitamin  1 tablet Oral QHS   polyethylene glycol  17 g Oral Daily    senna-docusate  2 tablet Oral BID   sodium chloride flush  10-40 mL Intracatheter Q12H   sodium zirconium cyclosilicate  10 g Oral Daily    Infusions:  sodium chloride Stopped (03/14/21 1254)   sodium chloride     sodium chloride     sodium chloride     anticoagulant sodium citrate      PRN Medications: sodium chloride, sodium chloride, sodium chloride, sodium chloride, acetaminophen **OR** acetaminophen, alteplase, anticoagulant sodium citrate, camphor-menthol, diphenhydrAMINE, Gerhardt's butt cream, guaiFENesin-dextromethorphan, lidocaine, LORazepam, menthol-cetylpyridinium, ondansetron (ZOFRAN) IV, oxyCODONE, sodium chloride, sodium chloride  Assessment/Plan   1. Acute on Chronic Biventricular Heart Failure -> cardiogenic shock:  Known NICM since 2019, previously followed by [facility].  Echo 8/21 EF < 20%, moderate LV dilation, severely decreased RV function, severe biatrial enlargement, mild MR. No history of ETOH/drugs.  No FH of cardiomyopathy (mother with Chicken).  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  He is too large for cardiac MRI. RHC/LHC on 03/04/20 with no CAD low CI at 2.1. Evaluated by EP but not a candidate for ICD given BMI.  Echo this admission with EF < 20%, RV poorly visualized.  Developed cardiogenic shock -> AKI in setting of AFL with RVR. Dobutamine started at 2.5 and increased to 7.5.  NE added to support CVVHD. He was transitioned to [facility] but unable to tolerate this with rising CVP and respiratory distress, inability to comply with fluid restriction.  Restarted CVVH for additional fluid removal, but again transitioned to iHD. NE off with stable and intermittently borderline blood pressures.  Co-ox of 58% and CVP of 14-16.  Weight loss of 3 lb noted  - Suspect he will fail iHD unless he can control his intake which he has been unsuccessful with so far. Nursing reports finding food/drinks hidden in room multiple times. -  I have explained to him that there is no way he will be able to be maintained with outpatient iHD given his current situation of low output and non-compliance. The best option now is likely hospice but he refuses.  - He spontaneously converted to NSR, so cardioversion planned for yesterday afternoon was cancelled.  - With AKI/hypotension, stopped Entresto, dapagliflozin, spironolactone, digoxin.  2. Aflutter/fibrillation w/ RVR:  Noted to be in Afib during a prior admission in 3/22 in the setting of CAP and a/c CHF, but converted to NSR on amiodarone. Discharged home w/ eliquis. Was in 2:1 AFL this admit in setting of a/c CHF w/ marked volume overload, + recent gastroenteritis w/ diarrhea. Has OSA but not using CPAP. TSH  WNL. Had DCCV on 8/5 and again 8/22. Back in AF over the weekend.   -  Will continue PO amiodarone 400 mg BID to avoid volume load with IV.   - Continue Eliquis.  - Converted back to NSR before scheduled cardioversion on 8/30  3. AKI on CKD IV: Likely ATN/cardiorenal.CVVHD stopped on 8/9 after catheter clotted.  iHD started on 8/11 with 2L of volume removed. Trialysis cath switched 8/12. Tolerated iHD on 8/13 and had dialysis catheter placed 8/16.  On 8/16, CVVH restarted with respiratory distress.  He has been transitioned to [facility] for a 2nd time.  CVP 10-11 today - was 14 prior to dialysis yesterday - At this point, with refusal of hospice care, will attempt iHD today off pressors and inotropy.  Case management working on outpatient dialysis arrangements.  Ethics consulted and following in the event that he fails iHD   4. Acute Hypoxic Respiratory Failure: Suspect underlying OHS/OSA exacerbated by CHF this admission. Now on nasal cannula.  - Continues to refuse BIPAP at night - Volume management complicated by noncompliance 5. Type 2 DM: Farxiga held with AKI.  - continue SSI 6. OSA: Has not been on Bipap at home.  7. Super Morbid Obesity: Body mass index is 57.65 kg/m. 8. UTI:  Suspect by UA, sent urine culture.  - Ceftriaxone IV course completed.  9. Hyponatremia: Improved with CVVH 123 -> 126 -> 125 -> 126 --> 130 --> 128 --> 129 - He has been counseled on free water restriction numerous times. This remains an issue.  10. ID: With fever, started empirically on vancomycin/cefepime.  Cultures NGTD.  CVL removed and PICC placed. He completed 5 days empiric abx.  11. 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 Platelets recovered.  SRA+.  - now back on Eliquis. Hgb stable and is 9 today.    Thurnell Lose, AGACNP-BC  Patient seen with NP, agree with the above note.   He is now off NE and dobutamine, BP stable and co-ox 58%.  CVP 14 this morning. He is down to 2L [place] when awake. He went back into NSR and remains in NSR.   General: NAD Neck: Thick, JVP 14 cm, no thyromegaly or thyroid nodule.  Lungs: Clear to auscultation bilaterally with normal respiratory effort. CV: Nondisplaced PMI.  Heart regular S1/S2, no S3/S4, no murmur.  Trace ankle edema.  Abdomen: Soft, nontender, no hepatosplenomegaly, no distention.  Skin: Intact without lesions or rashes.  Neurologic: Alert and oriented x 3.  Psych: Normal affect. Extremities: No clubbing or cyanosis.  HEENT: Normal.   Patient to get HD today off pressors.  If he tolerates this well, hopefully we can prepare him for home with outpatient HD.  He is on much lower oxygen during the day and has been walking in hall.  He has a tunneled catheter for HD.  BP stable now on midodrine 20 tid.   Continue amiodarone 400 mg bid for 1 more week then 200 mg bid x 1 week then 200 mg daily.  Continue apixaban.  He remains in NSR today.   He has refused CPAP here, says he will use at home.   Still concerned about him long-term due to difficulty with fluid restriction.   CRITICAL CARE Performed by: [name]  Total critical care time: 35 minutes  Critical care time was  exclusive of separately billable procedures and treating other patients.  Critical care was necessary to treat or prevent imminent or  life-threatening deterioration.  Critical care was time spent personally by me on the following activities: development of treatment plan with patient and/or surrogate as well as nursing, discussions with consultants, evaluation of patient's response to treatment, examination of patient, obtaining history from patient or surrogate, ordering and performing treatments and interventions, ordering and review of laboratory studies, ordering and review of radiographic studies, pulse oximetry and re-evaluation of patient's condition.  [name] 03/25/2021 7:57 AM

## 2021-03-25 NOTE — Progress Notes (Signed)
Per Case Manager/Aleisa patient will discharge on 3l 02. He will not require high flow oxygen. Spoke to [employer] at [employer]. Patient has been given a TTS 5:45 am appointment. He needs to arrive at 5:30am.Current start date is Saturday, 9/3. Notified Aleisa, Case Manager.

## 2021-03-25 NOTE — Progress Notes (Signed)
Patient went to Hemodialysis for treatment, returning around 1700. Patient hide his chart under the blanket and reviewed all documents in chart. The patient brought it to my attention there was someone else's chart in the middle of his chart. He stated to me that he had taken pictures of every single page in the chart and that he wanted apple juice to delete the photos. I then spoke with charge nurse- Terie Purser and Mollie Germany about to handle the situation. Elmyra Ricks spoke with the [facility] and was told to call security to see if he would allow security to look at his phones. Ethelda Chick ([place]) came to bedside; patient allowed the security to look at his phone and see that no pictures were actually taken on any of his three phones.  Consuella Lose, RN

## 2021-03-25 NOTE — TOC CM/SW Note (Addendum)
HF TOC CM spoke to pt at length at bedside. States his goal is to get well and get back to work. Educated the importance of compliance with taking meds as prescribed, oxygen, NIV/Trilogy at night, compliance with dialysis treatments, daily weights, diet/exercise, follow up appts as prescribed and instructed by his physicians. Pt verbalized understanding. [place] for oxygen and NIV/Trilogy at dc with planned dc 9/1 or 9/2. Spoke to attending about having meds sent up from [name] prior to dc. [employer] will deliver a portable to room for pt to use to dc home and deliver oxygen concentrator to his home. CM took new disability paperwork to [place] to complete and submit. Previous paperwork had an old claim number for previous hospital stay. Explained to pt and mother in detail. Waiting on pt outpt HD clinic information and schedule. Attending updated. [place], Heart Failure TOC CM [phone]

## 2021-03-25 NOTE — TOC CM/SW Note (Signed)
SATURATION QUALIFICATIONS: (This note is used to comply with regulatory documentation for home oxygen)  Patient Saturations on Room Air at Rest = 77%  Patient Saturations on Room Air while Ambulating = %  Patient Saturations on 3 Liters of oxygen while Ambulating = 90%  Please briefly explain why patient needs home oxygen: CHF

## 2021-03-25 NOTE — Progress Notes (Signed)
Patient ID: Terrill Makovec, male   DOB: 03-30-1986, 35 y.o.   MRN: [id number] S:Wants to go home, no new complaints.  Off of pressors and now on 2 liters HFNC O:BP (!) 161/115   Pulse 86   Temp 98.4 F (36.9 C) (Oral)   Resp (!) 24   Ht '5\' 11"'$  (1.803 m)   Wt (!) 187.5 kg   SpO2 91%   BMI 57.65 kg/m   Intake/Output Summary (Last 24 hours) at 03/25/2021 0936 Last data filed at 03/25/2021 0800 Gross per 24 hour  Intake 1147 ml  Output --  Net 1147 ml   Intake/Output: I/O last 3 completed shifts: In: [id number] [P.O.:1303; I.V.:55] Out: -   Intake/Output this shift:  Total I/O In: 120 [P.O.:120] Out: -  Weight change: -5.1 kg Gen:NAD CVS: RRR Resp:CTA [id number], +BS, soft, NT Ext: no edema  Recent Labs  Lab 03/21/21 1635 03/22/21 0335 03/22/21 1600 03/23/21 0350 03/23/21 1600 03/24/21 0600 03/25/21 0431  NA 127* 125* 128* 126* 130* 128* 129*  K 4.7 4.7 4.8 4.7 4.6 5.3* 5.3*  CL 91* 88* 92* 91* 94* 93* 94*  CO2 '25 22 25 23 25 24 22  '$ GLUCOSE 113* 154* 119* 147* 122* 106* 128*  BUN 38* 43* 49* 55* 34* 45* 58*  CREATININE 8.15* 8.90* 9.19* 9.68* 7.38* 8.89* 10.66*  ALBUMIN 2.9* 2.8* 2.8* 2.8* 3.1* 3.2* 3.1*  CALCIUM 9.2 9.5 9.2 9.4 9.2 9.7 9.6  PHOS 4.8* 4.4 5.3* 5.6* 4.0 5.8* 6.8*   Liver Function Tests: Recent Labs  Lab 03/23/21 1600 03/24/21 0600 03/25/21 0431  ALBUMIN 3.1* 3.2* 3.1*   No results for input(s): LIPASE, AMYLASE in the last 168 hours. No results for input(s): AMMONIA in the last 168 hours. CBC: Recent Labs  Lab 03/21/21 0401 03/22/21 0335 03/23/21 0350 03/24/21 0600 03/25/21 0431  WBC 10.2 9.9 9.1 9.0 9.2  HGB 9.3* 8.8* 8.9* 9.4* 9.0*  HCT 29.0* 28.7* 28.4* 30.5* 28.8*  MCV 93.9 96.0 95.9 96.5 98.0  PLT 250 275 288 291 297   Cardiac Enzymes: No results for input(s): CKTOTAL, CKMB, CKMBINDEX, TROPONINI in the last 168 hours. CBG: Recent Labs  Lab 03/24/21 0643 03/24/21 1308 03/24/21 1620 03/24/21 2154 03/25/21 0640  GLUCAP 142* 96  123* 110* 139*    Iron Studies: No results for input(s): IRON, TIBC, TRANSFERRIN, FERRITIN in the last 72 hours. Studies/Results: No results found.  amiodarone  400 mg Oral BID   apixaban  5 mg Oral BID   Chlorhexidine Gluconate Cloth  6 each Topical Q0600   insulin aspart  0-5 Units Subcutaneous QHS   insulin aspart  0-9 Units Subcutaneous TID WC   midodrine  20 mg Oral TID WC   multivitamin  1 tablet Oral QHS   polyethylene glycol  17 g Oral Daily   senna-docusate  2 tablet Oral BID   sodium chloride flush  10-40 mL Intracatheter Q12H   sodium zirconium cyclosilicate  10 g Oral Daily    BMET    Component Value Date/Time   NA 129 (L) 03/25/2021 0431   K 5.3 (H) 03/25/2021 0431   CL 94 (L) 03/25/2021 0431   CO2 22 03/25/2021 0431   GLUCOSE 128 (H) 03/25/2021 0431   BUN 58 (H) 03/25/2021 0431   CREATININE 10.66 (H) 03/25/2021 0431   CALCIUM 9.6 03/25/2021 0431   GFRNONAA 6 (L) 03/25/2021 0431   GFRAA >60 03/27/2020 1055   CBC    Component Value Date/Time   WBC 9.2  03/25/2021 0431   RBC 2.94 (L) 03/25/2021 0431   HGB 9.0 (L) 03/25/2021 0431   HCT 28.8 (L) 03/25/2021 0431   PLT 297 03/25/2021 0431   MCV 98.0 03/25/2021 0431   MCH 30.6 03/25/2021 0431   MCHC 31.3 03/25/2021 0431   RDW 20.0 (H) 03/25/2021 0431   LYMPHSABS 1.6 02/11/2021 1208   MONOABS 0.5 02/11/2021 1208   EOSABS 0.1 02/11/2021 1208   BASOSABS 0.1 02/11/2021 1208      Assessment/Plan:   AKI due to cardiogenic shock - in setting of a flutter with RVR and underlying severe biventricular CHF.  Pt started on CRRT and failed initial transition to IHD on 8/19.  Able to tolerate IHD on 03/18/21 with levophed and dobutamine.  Not responding to IV lasix.   Plan for HD today off of pressors and see if he can tolerate UF. If he is not able to tolerate IHD without pressors, there is nothing further we can offer.  He has declined hospice and believes that he will tolerate HD. If he does tolerate HD will start  looking for outpatient HD for AKI but not sure his insurance will pay for it.  Will need CM/SW assistance with placement. Acute on chronic Biventricular CHF -  as above plan to stop dobutamine and decrease norepi and follow response Aflutter/fib with RVR - for DCCV 03/25/21, on amiodarone per Heart failure team. Hyponatremia - remains low despite IHD.  Poor prognostic indicator. Hyperkalemia - will give lokelma and plan for HD tomorrow. HIT + - on bivalirudin. CKD-BMD - will start on lanthanum for elevated phos and follow. Cardiogenic shock/hypotension - on dobutamine, levophed, midodrine. Disposition - if he fails to wean, will need palliative care assistance with EOL decisions.  He may have an outpatient spot at [facility] but will need to verify.  Donetta Potts, MD [employer] [phone]

## 2021-03-26 ENCOUNTER — Other Ambulatory Visit (HOSPITAL_COMMUNITY): Payer: Self-pay

## 2021-03-26 DIAGNOSIS — E1122 Type 2 diabetes mellitus with diabetic chronic kidney disease: Secondary | ICD-10-CM | POA: Insufficient documentation

## 2021-03-26 LAB — RENAL FUNCTION PANEL
Albumin: 3.5 g/dL (ref 3.5–5.0)
Anion gap: 11 (ref 5–15)
BUN: 36 mg/dL — ABNORMAL HIGH (ref 6–20)
CO2: 25 mmol/L (ref 22–32)
Calcium: 9.6 mg/dL (ref 8.9–10.3)
Chloride: 94 mmol/L — ABNORMAL LOW (ref 98–111)
Creatinine, Ser: 8.41 mg/dL — ABNORMAL HIGH (ref 0.61–1.24)
GFR, Estimated: 8 mL/min — ABNORMAL LOW (ref >60)
Glucose, Bld: 118 mg/dL — ABNORMAL HIGH (ref 70–99)
Phosphorus: 4.6 mg/dL (ref 2.5–4.6)
Potassium: 4.8 mmol/L (ref 3.5–5.1)
Sodium: 130 mmol/L — ABNORMAL LOW (ref 135–145)

## 2021-03-26 LAB — CBC
HCT: 30.9 % — ABNORMAL LOW (ref 39.0–52.0)
Hemoglobin: 9.8 g/dL — ABNORMAL LOW (ref 13.0–17.0)
MCH: 30.6 pg (ref 26.0–34.0)
MCHC: 31.7 g/dL (ref 30.0–36.0)
MCV: 96.6 fL (ref 80.0–100.0)
Platelets: 330 10*3/uL (ref 150–400)
RBC: 3.2 MIL/uL — ABNORMAL LOW (ref 4.22–5.81)
RDW: 20.5 % — ABNORMAL HIGH (ref 11.5–15.5)
WBC: 9.5 10*3/uL (ref 4.0–10.5)
nRBC: 0.2 % (ref 0.0–0.2)

## 2021-03-26 LAB — COOXEMETRY PANEL
Carboxyhemoglobin: 1.3 % (ref 0.5–1.5)
Methemoglobin: 1 % (ref 0.0–1.5)
O2 Saturation: 48.8 %
Total hemoglobin: 9.9 g/dL — ABNORMAL LOW (ref 12.0–16.0)

## 2021-03-26 LAB — MAGNESIUM: Magnesium: 2.1 mg/dL (ref 1.7–2.4)

## 2021-03-26 LAB — GLUCOSE, CAPILLARY
Glucose-Capillary: 109 mg/dL — ABNORMAL HIGH (ref 70–99)
Glucose-Capillary: 119 mg/dL — ABNORMAL HIGH (ref 70–99)

## 2021-03-26 MED ORDER — MIDODRINE HCL 10 MG PO TABS
20.0000 mg | ORAL_TABLET | Freq: Three times a day (TID) | ORAL | 6 refills | Status: DC
  Filled 2021-03-26: qty 82, 14d supply, fill #0

## 2021-03-26 MED ORDER — AMIODARONE HCL 200 MG PO TABS
ORAL_TABLET | ORAL | 6 refills | Status: DC
  Filled 2021-03-26: qty 40, 30d supply, fill #0

## 2021-03-26 MED ORDER — AMIODARONE HCL 100 MG PO TABS
ORAL_TABLET | ORAL | 0 refills | Status: DC
  Filled 2021-03-26: qty 80, fill #0

## 2021-03-26 MED ORDER — LANTHANUM CARBONATE 500 MG PO CHEW
500.0000 mg | CHEWABLE_TABLET | Freq: Three times a day (TID) | ORAL | 6 refills | Status: DC
  Filled 2021-03-26: qty 90, 30d supply, fill #0

## 2021-03-26 MED ORDER — LANTHANUM CARBONATE 500 MG PO CHEW: 500.0000 mg | CHEWABLE_TABLET | Freq: Three times a day (TID) | ORAL | 6 refills | Status: DC

## 2021-03-26 MED ORDER — APIXABAN 5 MG PO TABS
5.0000 mg | ORAL_TABLET | Freq: Two times a day (BID) | ORAL | 11 refills | Status: DC
  Filled 2021-03-26: qty 60, 30d supply, fill #0

## 2021-03-26 NOTE — Progress Notes (Signed)
Patient ID: Phillip Young, male   DOB: [birth year], 35 y.o.   MRN: [id number] S: Events overnight noted. O:BP (!) 91/52   Pulse 86   Temp 98 F (36.7 C) (Oral)   Resp (!) 37   Ht '5\' 11"'$  (1.803 m)   Wt (!) 187 kg   SpO2 96%   BMI 57.50 kg/m   Intake/Output Summary (Last 24 hours) at 03/26/2021 1028 Last data filed at 03/26/2021 0700 Gross per 24 hour  Intake 648 ml  Output 4091 ml  Net -3443 ml   Intake/Output: I/O last 3 completed shifts: In: 1268 [P.O.:1248; I.V.:20] Out: [id number] [Urine:125; [id number]  Intake/Output this shift:  No intake/output data recorded. Weight change: 3.5 kg Gen:NAD CVS: RRR Resp: CTA Abd: obese, +BS, soft Ext: no edema  Recent Labs  Lab 03/22/21 0335 03/22/21 1600 03/23/21 0350 03/23/21 1600 03/24/21 0600 03/25/21 0431 03/26/21 0423  NA 125* 128* 126* 130* 128* 129* 130*  K 4.7 4.8 4.7 4.6 5.3* 5.3* 4.8  CL 88* 92* 91* 94* 93* 94* 94*  CO2 '22 25 23 25 24 22 25  '$ GLUCOSE 154* 119* 147* 122* 106* 128* 118*  BUN 43* 49* 55* 34* 45* 58* 36*  CREATININE 8.90* 9.19* 9.68* 7.38* 8.89* 10.66* 8.41*  ALBUMIN 2.8* 2.8* 2.8* 3.1* 3.2* 3.1* 3.5  CALCIUM 9.5 9.2 9.4 9.2 9.7 9.6 9.6  PHOS 4.4 5.3* 5.6* 4.0 5.8* 6.8* 4.6   Liver Function Tests: Recent Labs  Lab 03/24/21 0600 03/25/21 0431 03/26/21 0423  ALBUMIN 3.2* 3.1* 3.5   No results for input(s): LIPASE, AMYLASE in the last 168 hours. No results for input(s): AMMONIA in the last 168 hours. CBC: Recent Labs  Lab 03/22/21 0335 03/23/21 0350 03/24/21 0600 03/25/21 0431 03/26/21 0423  WBC 9.9 9.1 9.0 9.2 9.5  HGB 8.8* 8.9* 9.4* 9.0* 9.8*  HCT 28.7* 28.4* 30.5* 28.8* 30.9*  MCV 96.0 95.9 96.5 98.0 96.6  PLT 275 288 291 297 330   Cardiac Enzymes: No results for input(s): CKTOTAL, CKMB, CKMBINDEX, TROPONINI in the last 168 hours. CBG: Recent Labs  Lab 03/25/21 0640 03/25/21 1056 03/25/21 1728 03/25/21 2102 03/26/21 0630  GLUCAP 139* 108* 90 162* 109*    Iron Studies: No results  for input(s): IRON, TIBC, TRANSFERRIN, FERRITIN in the last 72 hours. Studies/Results: No results found.  amiodarone  400 mg Oral BID   apixaban  5 mg Oral BID   Chlorhexidine Gluconate Cloth  6 each Topical Q0600   insulin aspart  0-5 Units Subcutaneous QHS   insulin aspart  0-9 Units Subcutaneous TID WC   lanthanum  500 mg Oral TID WC   midodrine  20 mg Oral TID WC   multivitamin  1 tablet Oral QHS   polyethylene glycol  17 g Oral Daily   senna-docusate  2 tablet Oral BID   sodium chloride flush  10-40 mL Intracatheter Q12H   sodium zirconium cyclosilicate  10 g Oral Daily    BMET    Component Value Date/Time   NA 130 (L) 03/26/2021 0423   K 4.8 03/26/2021 0423   CL 94 (L) 03/26/2021 0423   CO2 25 03/26/2021 0423   GLUCOSE 118 (H) 03/26/2021 0423   BUN 36 (H) 03/26/2021 0423   CREATININE 8.41 (H) 03/26/2021 0423   CALCIUM 9.6 03/26/2021 0423   GFRNONAA 8 (L) 03/26/2021 0423   GFRAA >60 03/27/2020 1055   CBC    Component Value Date/Time   WBC 9.5 03/26/2021 0423   RBC 3.20 (  L) 03/26/2021 0423   HGB 9.8 (L) 03/26/2021 0423   HCT 30.9 (L) 03/26/2021 0423   PLT 330 03/26/2021 0423   MCV 96.6 03/26/2021 0423   MCH 30.6 03/26/2021 0423   MCHC 31.7 03/26/2021 0423   RDW 20.5 (H) 03/26/2021 0423   LYMPHSABS 1.6 02/11/2021 1208   MONOABS 0.5 02/11/2021 1208   EOSABS 0.1 02/11/2021 1208   BASOSABS 0.1 02/11/2021 1208      Assessment/Plan:   AKI due to cardiogenic shock - in setting of a flutter with RVR and underlying severe biventricular CHF.  Pt started on CRRT and failed initial transition to IHD on 8/19.  Able to tolerate IHD on 03/18/21 with levophed and dobutamine.  Not responding to IV lasix.   Was able to tolerate 4 hours HD without pressors 03/25/21 and UF 4 liters Set up for outpatient dialysis at [facility] on TTS schedule.  He will start Saturday at 5 am. Acute on chronic Biventricular CHF -  follow up with advanced heart failure team Aflutter/fib with  RVR - s/p successful DCCV 03/25/21, on amiodarone per Heart failure team. Hyponatremia - remains low despite IHD.  Poor prognostic indicator. Hyperkalemia - improved with HD.  OK to resume outpatient dose of diuretics, torsemide 80 mg daily to see if he starts to regain some renal function.  HIT + - on bivalirudin. CKD-BMD - will start on lanthanum for elevated phos and follow. Cardiogenic shock/hypotension - on dobutamine, levophed, midodrine. Disposition - [place] for discharge to home today and follow up at [facility] Saturday at 5 am.  Also discussed the need for fluid and sodium restriction.   Donetta Potts, MD [employer] [phone]

## 2021-03-26 NOTE — Progress Notes (Signed)
Patient ID: Phillip Young, male   DOB: 11-Jul-1986, 35 y.o.   MRN: [id number]      Advanced Heart Failure Rounding Note  PCP-Cardiologist: Dr. [name]  Subjective:    Patient is doing well today off pressors.  CVP 16, on 5-6L [place] overnight.  He had successful dialysis yesterday and weight is down.   He remains in NSR today.   Objective:   Weight Range: (!) 187 kg Body mass index is 57.5 kg/m.   Vital Signs:   Temp:  [97.8 F (36.6 C)-98.2 F (36.8 C)] 98 F (36.7 C) (09/01 0000) Pulse Rate:  [80-103] 103 (09/01 0700) Resp:  [12-34] 17 (09/01 0700) BP: (83-161)/(34-139) 91/52 (09/01 0200) SpO2:  [72 %-98 %] 97 % (09/01 0700) Weight:  [187 kg-191 kg] 187 kg (08/31 1609) Last BM Date: 03/25/21  Weight change: Filed Weights   03/25/21 0438 03/25/21 1203 03/25/21 1609  Weight: (!) 187.5 kg (!) 191 kg (!) 187 kg    Intake/Output:   Intake/Output Summary (Last 24 hours) at 03/26/2021 0738 Last data filed at 03/26/2021 0700 Gross per 24 hour  Intake 768 ml  Output 4091 ml  Net -3323 ml     Physical Exam   General: NAD Neck: JVP 12 cm, no thyromegaly or thyroid nodule.  Lungs: Clear to auscultation bilaterally with normal respiratory effort. CV: Nondisplaced PMI.  Heart regular S1/S2, no S3/S4, no murmur.  No peripheral edema.   Abdomen: Soft, nontender, no hepatosplenomegaly, no distention.  Skin: Intact without lesions or rashes.  Neurologic: Alert and oriented x 3.  Psych: Normal affect. Extremities: No clubbing or cyanosis.  HEENT: Normal.    Telemetry   SR in 80s. Personally reviewed   Labs    CBC Recent Labs    03/25/21 0431 03/26/21 0423  WBC 9.2 9.5  HGB 9.0* 9.8*  HCT 28.8* 30.9*  MCV 98.0 96.6  PLT 297 [id number]   Basic Metabolic Panel Recent Labs    03/25/21 0431 03/26/21 0423  NA 129* 130*  K 5.3* 4.8  CL 94* 94*  CO2 22 25  GLUCOSE 128* 118*  BUN 58* 36*  CREATININE 10.66* 8.41*  CALCIUM 9.6 9.6  MG 2.4 2.1  PHOS 6.8* 4.6   Liver  Function Tests Recent Labs    03/25/21 0431 03/26/21 0423  ALBUMIN 3.1* 3.5    No results for input(s): LIPASE, AMYLASE in the last 72 hours. Cardiac Enzymes No results for input(s): CKTOTAL, CKMB, CKMBINDEX, TROPONINI in the last 72 hours.  BNP: BNP (last 3 results) Recent Labs    10/10/20 1336 11/07/20 0932 02/11/21 1500  BNP 126.9* 106.0* 194.3*    ProBNP (last 3 results) No results for input(s): PROBNP in the last 8760 hours.   D-Dimer No results for input(s): DDIMER in the last 72 hours.  Hemoglobin A1C No results for input(s): HGBA1C in the last 72 hours.  Fasting Lipid Panel No results for input(s): CHOL, HDL, LDLCALC, TRIG, CHOLHDL, LDLDIRECT in the last 72 hours. Thyroid Function Tests No results for input(s): TSH, T4TOTAL, T3FREE, THYROIDAB in the last 72 hours.  Invalid input(s): FREET3   Other results:   Imaging    No results found.   Medications:     Scheduled Medications:  amiodarone  400 mg Oral BID   apixaban  5 mg Oral BID   Chlorhexidine Gluconate Cloth  6 each Topical Q0600   insulin aspart  0-5 Units Subcutaneous QHS   insulin aspart  0-9 Units Subcutaneous TID WC  lanthanum  500 mg Oral TID WC   midodrine  20 mg Oral TID WC   multivitamin  1 tablet Oral QHS   polyethylene glycol  17 g Oral Daily   senna-docusate  2 tablet Oral BID   sodium chloride flush  10-40 mL Intracatheter Q12H   sodium zirconium cyclosilicate  10 g Oral Daily    Infusions:  sodium chloride Stopped (03/14/21 1254)   anticoagulant sodium citrate      PRN Medications: sodium chloride, acetaminophen **OR** acetaminophen, anticoagulant sodium citrate, camphor-menthol, diphenhydrAMINE, Gerhardt's butt cream, guaiFENesin-dextromethorphan, lidocaine, LORazepam, menthol-cetylpyridinium, ondansetron (ZOFRAN) IV, oxyCODONE, sodium chloride, sodium chloride  Assessment/Plan   1. Acute on Chronic Biventricular Heart Failure -> cardiogenic shock:  Known NICM  since 2019, previously followed by [facility].  Echo 8/21 EF < 20%, moderate LV dilation, severely decreased RV function, severe biatrial enlargement, mild MR. No history of ETOH/drugs.  No FH of cardiomyopathy (mother with [place]).  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  He is too large for cardiac MRI. RHC/LHC on 03/04/20 with no CAD low CI at 2.1. Evaluated by EP but not a candidate for ICD given BMI.  Echo this admission with EF < 20%, RV poorly visualized.  Developed cardiogenic shock -> AKI in setting of AFL with RVR. Dobutamine started at 2.5 and increased to 7.5.  NE added to support CVVHD. He was transitioned to [facility] but unable to tolerate this with rising CVP and respiratory distress, inability to comply with fluid restriction.  Restarted CVVH for additional fluid removal, but again transitioned to iHD.  He is now off pressors/inotropes and has tolerated HD.  Oxygen is down to 2L during the day, 5-6 L at night. CVP 16 but weight is down and he denies dyspnea.  - Continue HD for volume control, he is tolerating.  - With AKI/hypotension, stopped Entresto, dapagliflozin, spironolactone, digoxin.  2. Aflutter/fibrillation w/ RVR:  Noted to be in Afib during a prior admission in 3/22 in the setting of CAP and a/c CHF, but converted to NSR on amiodarone. Discharged home w/ eliquis. Was in 2:1 AFL this admit in setting of a/c CHF w/ marked volume overload, + recent gastroenteritis w/ diarrhea. Has OSA but not using CPAP. TSH WNL. Had DCCV on 8/5 and again 8/22. Back in AF over the weekend, now again in NSR.   - Amiodarone 200 mg bid at discharge x 10 days then 200 daily.   - Continue Eliquis.  3. AKI on CKD IV: Likely ATN/cardiorenal.CVVHD stopped on 8/9 after catheter clotted.  iHD started on 8/11 with 2L of volume removed. Trialysis cath switched 8/12. Tolerated iHD on 8/13 and had dialysis catheter placed 8/16.  On 8/16, CVVH restarted with respiratory  distress.  He has been transitioned to [facility] for a 2nd time.  He is tolerating HD so far. - Will make sure he has outpatient HD placement.   4. Acute Hypoxic Respiratory Failure: Suspect underlying OHS/OSA exacerbated by CHF this admission. Now on nasal cannula.  - he will need home oxygen.  5. Type 2 DM: Farxiga held with AKI.  - continue SSI 6. OSA: Has not been on Bipap at home.  7. Super Morbid Obesity: Body mass index is 57.5 kg/m. 8. UTI: Suspect by UA, sent urine culture.  - Ceftriaxone IV course completed.  9. Hyponatremia: Improved with CVVH 123 -> 126 -> 125 -> 126 --> 130 --> 128 --> 129 --> 130.  - He has been counseled  on free water restriction numerous times. This remains an issue.  10. ID: With fever, started empirically on vancomycin/cefepime.  Cultures NGTD.  CVL removed and PICC placed. He completed 5 days empiric abx.  11. 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 Platelets recovered.  SRA+.  - now back on Eliquis. Hgb stable and is 9.8 today.    [place] for home today.  Will followup in CHF clinic.  Plan for TTS HD, making sure he has finalized plan for outpatient HD.  He will need home oxygen.  He should restart on CPAP at home.  Cardiac meds for discharge: amiodarone 200 mg bid x 10 days then 200 mg daily, apixaban 5 mg bid, midodrine 20 mg tid.   [name] 03/26/2021 7:38 AM

## 2021-03-26 NOTE — TOC Progression Note (Addendum)
Transition of Care (TOC) - Progression Note  Heart Failure   Patient Details  Name: Phillip Young MRN: [id number] Date of Birth: [birth year]  Transition of Care [facility]) CM/SW [place], [place] Phone Number: 03/26/2021, 9:26 AM  Clinical Narrative:    CSW received a call this morning from cardiology reporting that Mr. Labrum will be discharging today and will need home 02. [name] reported that his mother, [name] will take him home today at discharge. CSW reached out to [place] with [employer] for home 02 and he will deliver a portable tank to the room for discharge.  CSW will continue to follow throughout discharge.   Expected Discharge Plan: Home/Self Care Barriers to Discharge: Continued Medical Work up  Expected Discharge Plan and Services Expected Discharge Plan: Home/Self Care In-house Referral: Clinical Social Work Discharge Planning Services: CM Consult, HF Clinic   Living arrangements for the past 2 months: Single Family Home Expected Discharge Date: 03/26/21                                     Social Determinants of Health (SDOH) Interventions Food Insecurity Interventions: Assist with [employer] Application Financial Strain Interventions: Other (Comment) (Referral to [facility]) Housing Interventions: Intervention Not Indicated Transportation Interventions: Intervention Not Indicated  Readmission Risk Interventions Readmission Risk Prevention Plan 10/16/2020 10/16/2020 10/14/2020  Transportation Screening Complete Complete Complete  PCP or Specialist Appt within 5-7 Days Complete Complete Complete  Home Care Screening Complete - -  Medication Review (RN CM) Referral to Pharmacy - -  Some recent data might be hidden   Terrianna Holsclaw, MSW, [place] Heart Failure Social Worker

## 2021-03-26 NOTE — Progress Notes (Signed)
Rounded on patient today in correlation to transition to outpatient HD. Phillip Young given Kidney Failure book. Patient educated at the bedside regarding care of tunneled dialysis catheter. Patient verbalizes concern for inability to take showers.  Patient also educated on the importance of adhering to scheduled dialysis treatments, the effects of fluid overload, hyperkalemia and hyperphosphatemia. Patient capable of re-verbalizing via teach back method. This RN reinforced the importance of adhering to his appointments and diet as it applies to renal failure.  [name] also verbalizes concern about having seen information that didn't belong to him in his chart. He reports that he was asked to allow security to go through all of his phones to ensure that he had not pictured the information that did not belong to him. He reports that both his nurse and the charge nurse were aware previously. This nurse verbalizes understanding and asked if he requested any further assistance at this time. Handouts and contact information provided to patient for any further assistance. Will follow as appropriate.   Dorthey Sawyer, RN  Dialysis Nurse Coordinator Phone: [phone]

## 2021-03-26 NOTE — TOC Transition Note (Signed)
Transition of Care [facility]) - CM/SW Discharge Note Heart Failure   Patient Details  Name: Phillip Young MRN: [id number] Date of Birth: January 09, 1986  Transition of Care [facility]) CM/SW Contact:  [place], [place] Phone Number: 03/26/2021, 11:51 AM   Clinical Narrative:    CSW followed up with [employer] again for delivery of 02 to bedside for patients discharge home today. Outpatient HD hopefully set up with [facility] for TTS 5:45am and next HD appointment scheduled for Saturday. [name] [name] to provide transportation home for [name].  CSW will sign off for now as social work intervention is no longer needed. Please consult [place] again if new needs arise.   Final next level of care: Home/Self Care Barriers to Discharge: No Barriers Identified   Patient Goals and CMS Choice Patient states their goals for this hospitalization and ongoing recovery are:: would like to start back on Bipap      Discharge Placement                       Discharge Plan and Services In-house Referral: Clinical Social Work Discharge Planning Services: CM Consult, HF Clinic            DME Arranged: Oxygen DME Agency: [employer], [facility] Date DME Agency Contacted: 03/26/21 Time DME Agency Contacted: [phone] Representative spoke with at DME Agency: [place] Determinants of Health ([place]) Interventions Food Insecurity Interventions: Assist with SNAP Application Financial Strain Interventions: Other (Comment) (Referral to [facility]) Housing Interventions: Intervention Not Indicated Transportation Interventions: Intervention Not Indicated   Readmission Risk Interventions Readmission Risk Prevention Plan 10/16/2020 10/16/2020 10/14/2020  Transportation Screening Complete Complete Complete  PCP or Specialist Appt within 5-7 Days Complete Complete Complete  Home Care Screening Complete - -  Medication Review (RN CM) Referral to Pharmacy - -  Some recent data might be hidden    English Tomer, MSW, [place] Heart Failure Social Worker

## 2021-03-26 NOTE — Progress Notes (Signed)
Patient ready for discharge.  Patient given discharge instructions and all questions answered. Verbalizes understanding. Mother at beside all of her questions were answered. Patient aware of follow up appointments and scheduled dialysis. Verbalizes understanding and repeated dates and times of appointments.  All valuables returned.   Kathleene Hazel RN

## 2021-03-27 ENCOUNTER — Telehealth (HOSPITAL_COMMUNITY): Payer: Self-pay | Admitting: Nephrology

## 2021-03-27 NOTE — Telephone Encounter (Signed)
Transition of care contact from inpatient facility  Date of Discharge: 03/26/21 Date of Contact: attempted 03/27/21 Method of contact: Phone  Attempted to contact patient to discuss transition of care from inpatient admission. Patient did not answer the phone. No ability to leave message. To start OP HD tomorrow - will try to touch base with him at dialysis next week.  Veneta Penton, PA-C [employer] Pager [phone]

## 2021-03-27 NOTE — TOC CM/SW Note (Signed)
HF TOC CM received message from [place] states that pt's insurance and waiting auth for [facility]. [place], Heart Failure TOC CM [phone]

## 2021-03-28 DIAGNOSIS — N2581 Secondary hyperparathyroidism of renal origin: Secondary | ICD-10-CM | POA: Diagnosis not present

## 2021-03-28 DIAGNOSIS — Z992 Dependence on renal dialysis: Secondary | ICD-10-CM | POA: Diagnosis not present

## 2021-03-28 DIAGNOSIS — T8249XD Other complication of vascular dialysis catheter, subsequent encounter: Secondary | ICD-10-CM | POA: Diagnosis not present

## 2021-03-28 DIAGNOSIS — N178 Other acute kidney failure: Secondary | ICD-10-CM | POA: Diagnosis not present

## 2021-03-31 ENCOUNTER — Telehealth: Payer: Self-pay | Admitting: *Deleted

## 2021-03-31 DIAGNOSIS — Z992 Dependence on renal dialysis: Secondary | ICD-10-CM | POA: Diagnosis not present

## 2021-03-31 DIAGNOSIS — N178 Other acute kidney failure: Secondary | ICD-10-CM | POA: Diagnosis not present

## 2021-03-31 DIAGNOSIS — N2581 Secondary hyperparathyroidism of renal origin: Secondary | ICD-10-CM | POA: Diagnosis not present

## 2021-03-31 DIAGNOSIS — T8249XD Other complication of vascular dialysis catheter, subsequent encounter: Secondary | ICD-10-CM | POA: Diagnosis not present

## 2021-03-31 DIAGNOSIS — N179 Acute kidney failure, unspecified: Secondary | ICD-10-CM | POA: Diagnosis not present

## 2021-04-01 ENCOUNTER — Encounter (HOSPITAL_COMMUNITY): Payer: Self-pay | Admitting: Cardiology

## 2021-04-01 ENCOUNTER — Other Ambulatory Visit: Payer: Self-pay

## 2021-04-01 ENCOUNTER — Ambulatory Visit (HOSPITAL_COMMUNITY)
Admission: RE | Admit: 2021-04-01 | Discharge: 2021-04-01 | Disposition: A | Discharge status: Home / Self Care | Payer: BC Managed Care – PPO | Source: Ambulatory Visit | Attending: Cardiology | Admitting: Cardiology

## 2021-04-01 VITALS — BP 104/60 | HR 94 | Wt >= 6400 oz

## 2021-04-01 DIAGNOSIS — Z7901 Long term (current) use of anticoagulants: Secondary | ICD-10-CM | POA: Diagnosis not present

## 2021-04-01 DIAGNOSIS — Z79899 Other long term (current) drug therapy: Secondary | ICD-10-CM | POA: Diagnosis not present

## 2021-04-01 DIAGNOSIS — E1122 Type 2 diabetes mellitus with diabetic chronic kidney disease: Secondary | ICD-10-CM | POA: Insufficient documentation

## 2021-04-01 DIAGNOSIS — I5022 Chronic systolic (congestive) heart failure: Secondary | ICD-10-CM

## 2021-04-01 DIAGNOSIS — Z992 Dependence on renal dialysis: Secondary | ICD-10-CM | POA: Diagnosis not present

## 2021-04-01 DIAGNOSIS — I480 Paroxysmal atrial fibrillation: Secondary | ICD-10-CM

## 2021-04-01 DIAGNOSIS — N186 End stage renal disease: Secondary | ICD-10-CM | POA: Diagnosis not present

## 2021-04-01 DIAGNOSIS — I5082 Biventricular heart failure: Secondary | ICD-10-CM | POA: Insufficient documentation

## 2021-04-01 DIAGNOSIS — Z6841 Body Mass Index (BMI) 40.0 and over, adult: Secondary | ICD-10-CM | POA: Insufficient documentation

## 2021-04-01 DIAGNOSIS — Z8249 Family history of ischemic heart disease and other diseases of the circulatory system: Secondary | ICD-10-CM | POA: Insufficient documentation

## 2021-04-01 DIAGNOSIS — G4733 Obstructive sleep apnea (adult) (pediatric): Secondary | ICD-10-CM

## 2021-04-01 DIAGNOSIS — I132 Hypertensive heart and chronic kidney disease with heart failure and with stage 5 chronic kidney disease, or end stage renal disease: Secondary | ICD-10-CM | POA: Diagnosis not present

## 2021-04-01 DIAGNOSIS — Z87891 Personal history of nicotine dependence: Secondary | ICD-10-CM | POA: Insufficient documentation

## 2021-04-01 DIAGNOSIS — I428 Other cardiomyopathies: Secondary | ICD-10-CM | POA: Diagnosis not present

## 2021-04-01 LAB — CBC
HCT: 30.8 % — ABNORMAL LOW (ref 39.0–52.0)
Hemoglobin: 9.6 g/dL — ABNORMAL LOW (ref 13.0–17.0)
MCH: 30.8 pg (ref 26.0–34.0)
MCHC: 31.2 g/dL (ref 30.0–36.0)
MCV: 98.7 fL (ref 80.0–100.0)
Platelets: 319 10*3/uL (ref 150–400)
RBC: 3.12 MIL/uL — ABNORMAL LOW (ref 4.22–5.81)
RDW: 21.5 % — ABNORMAL HIGH (ref 11.5–15.5)
WBC: 8.6 10*3/uL (ref 4.0–10.5)
nRBC: 0 % (ref 0.0–0.2)

## 2021-04-01 LAB — COMPREHENSIVE METABOLIC PANEL
ALT: 24 U/L (ref 0–44)
AST: 21 U/L (ref 15–41)
Albumin: 3.3 g/dL — ABNORMAL LOW (ref 3.5–5.0)
Alkaline Phosphatase: 108 U/L (ref 38–126)
Anion gap: 16 — ABNORMAL HIGH (ref 5–15)
BUN: 55 mg/dL — ABNORMAL HIGH (ref 6–20)
CO2: 22 mmol/L (ref 22–32)
Calcium: 9.4 mg/dL (ref 8.9–10.3)
Chloride: 94 mmol/L — ABNORMAL LOW (ref 98–111)
Creatinine, Ser: 9.5 mg/dL — ABNORMAL HIGH (ref 0.61–1.24)
GFR, Estimated: 7 mL/min — ABNORMAL LOW (ref >60)
Glucose, Bld: 119 mg/dL — ABNORMAL HIGH (ref 70–99)
Potassium: 4.2 mmol/L (ref 3.5–5.1)
Sodium: 132 mmol/L — ABNORMAL LOW (ref 135–145)
Total Bilirubin: 0.7 mg/dL (ref 0.3–1.2)
Total Protein: 8.1 g/dL (ref 6.5–8.1)

## 2021-04-01 LAB — TSH: TSH: 6.083 u[IU]/mL — ABNORMAL HIGH (ref 0.350–4.500)

## 2021-04-01 NOTE — Progress Notes (Signed)
[name] [employer] Disability forms completed during OV, forms signed by Dr [name] and faxed to [phone], pt aware this was done and given copy for his records

## 2021-04-01 NOTE — Patient Instructions (Signed)
Labs done today, your results will be available in MyChart, we will contact you for abnormal readings.  You have been referred to [place] for weight loss medication, they will call you to schedule  You have been referred to Cardiac Rehab in [facility], they will call you to schedule  Your physician recommends that you schedule a follow-up appointment in: 6 weeks  If you have any questions or concerns before your next appointment please send [place] a message through [place] or call our office at [phone].    TO LEAVE A MESSAGE FOR THE NURSE SELECT OPTION 2, PLEASE LEAVE A MESSAGE INCLUDING: YOUR NAME DATE OF BIRTH CALL BACK NUMBER REASON FOR CALL**this is important as we prioritize the call backs  YOU WILL RECEIVE A CALL BACK THE SAME DAY AS LONG AS YOU CALL BEFORE 4:00 PM  At the [facility], you and your health needs are our priority. As part of our continuing mission to provide you with exceptional heart care, we have created designated Provider Care Teams. These Care Teams include your primary Cardiologist (physician) and Advanced Practice Providers (APPs- Physician Assistants and Nurse Practitioners) who all work together to provide you with the care you need, when you need it.   You may see any of the following providers on your designated Care Team at your next follow up: Dr Glori Bickers Dr [name] Dr Patrice Paradise, NP Lyda Jester, [place] Ginnie Smart Audry Riles, PharmD   Please be sure to bring in all your medications bottles to every appointment.

## 2021-04-02 ENCOUNTER — Telehealth: Payer: Self-pay | Admitting: Student-PharmD

## 2021-04-02 ENCOUNTER — Inpatient Hospital Stay (HOSPITAL_COMMUNITY): Payer: BC Managed Care – PPO

## 2021-04-02 DIAGNOSIS — N186 End stage renal disease: Secondary | ICD-10-CM

## 2021-04-02 DIAGNOSIS — Z992 Dependence on renal dialysis: Secondary | ICD-10-CM

## 2021-04-02 DIAGNOSIS — N2581 Secondary hyperparathyroidism of renal origin: Secondary | ICD-10-CM | POA: Diagnosis not present

## 2021-04-02 DIAGNOSIS — N178 Other acute kidney failure: Secondary | ICD-10-CM | POA: Diagnosis not present

## 2021-04-02 DIAGNOSIS — T8249XD Other complication of vascular dialysis catheter, subsequent encounter: Secondary | ICD-10-CM | POA: Diagnosis not present

## 2021-04-02 NOTE — Progress Notes (Signed)
PCP: Phillip Pope, MD Cardiology: Dr. Aundra Young  HPI: 35 y.o. with history of cardiomyopathy and obesity.  He has been known to have a cardiomyopathy (biventricular failure) since back in 2019, echo in 2019 with EF 20% [facility]).  He has been followed in the [facility] system.  Admitted to [facility] in [id number] with A/C Systolic HF. Diuresed with IV lasix and transitioned to torsemide. Placed on milrinone with low output and had RHC/LHC. Cath showed no CAD and volume overload.  Blood gas revealed a CO2 level elevated to 78. CPAP was initiated.   He was unable to tolerate Coreg due to wheezing and diarrhea.   Echo in 11/21 showed EF 20-25%, RV poorly visualized.  He was seen by EP, thought to be too large for cardiac MRI.   Patient was admitted in 7/22 with cardiogenic shock and AKI.  He ended up on CVVH.  He had a prolonged hospitalization for volume removal and titration off inotropes/pressors.  Eventually he was able to transition to [facility] and was discharged. Hospitalization was complicated by AF with RVR as well as HIT.  He was cardioverted several times.  Echo in 7/22 showed EF < 20%, severe LV enlargement, normal RV.   Patient returns for followup of CHF, ESRD, and PAF.  He is doing well currently.  BP stable today, denies lightheadedness.  He remains on midodrine.  He does not have dyspnea with walking on flat ground.  He is wearing oxygen.  He needs CPAP at home.  Weight is remaining stable with HD.  No chest pain.  He wants to go back to work.   Labs (10/21): K 3.7, creatinine 1.07  ECG (personally reviewed): NSR, PVCs, right axis deviation, nonspecific T wave flattening.  PMH: 1. Hidradenitis suppuritiva 2. GERD 3. OHS/OSA  4. Diabetes 5. Morbid obesity 6. Fe deficiency anemia 7. Chronic systolic CHF: Nonischemic cardiomyopathy.  No history of ETOH or drugs.  +ANA.  No strong FH of cardiomyopathy.  - Echo (10/19, [facility]): EF 20%.  - Echo (5/21, [facility]): EF 20% -  Echo (8/21): EF < 20%, moderate LV dilation, severely decreased RV function with severe RV dilation, severe biatrial enlargement, mild MR.  - LHC/RHC (8/21): No significant coronary disease; mean RA 19, PA 71/20 mean 39, mean PCWP 23, CI 2.1, PVR 2.5 WU.  - Echo (11/21): EF 20-25%, RV poorly visualized.  - Echo (7/22): EF < 20%, severely dilated LV.  8. Prior smoker.  9. ESRD 10. Atrial fibrillation: Paroxysmal.  11. History of HIT.   ROS: All systems negative except as listed in HPI, PMH and Problem List.  Social History   Socioeconomic History   Marital status: Unknown    Spouse name: Not on file   Number of children: Not on file   Years of education: Not on file   Highest education level: Not on file  Occupational History   Not on file  Tobacco Use   Smoking status: Former    Packs/day: 1.00    Types: Cigarettes    Quit date: 2019    Years since quitting: 3.6   Smokeless tobacco: Former   Tobacco comments:    quit in 2019  Substance and Sexual Activity   Alcohol use: Never   Drug use: Never   Sexual activity: Not on file  Other Topics Concern   Not on file  Social History Narrative   Not on file   Social Determinants of Health   Financial Resource Strain:  Low Risk    Difficulty of Paying Living Expenses: Not very hard  Food Insecurity: Food Insecurity Present   Worried About [place] in the Last Year: Never true   Ran Out of Food in the Last Year: Sometimes true  Transportation Needs: No Transportation Needs   Lack of Transportation (Medical): No   Lack of Transportation (Non-Medical): No  Physical Activity: Not on file  Stress: Not on file  Social Connections: Not on file  Intimate Partner Violence: Not on file   Family History  Problem Relation Age of Onset   Heart disease Mother    Hypertension Mother    Pulmonary Hypertension Mother    Drug abuse Father        died due to Heroin overdose    Past Medical History:  Diagnosis Date    Biventricular congestive heart failure ([place])    Last Echo 11/2019 at [facility] reveals EF 20%   Class 3 severe obesity due to excess calories with serious comorbidity and body mass index (BMI) of 50.0 to 59.9 in adult [facility]) 02/26/2020   Essential hypertension 02/26/2020   GERD without esophagitis 02/26/2020   Hidradenitis suppurativa 02/26/2020   OSA (obstructive sleep apnea) 02/26/2020   Prediabetes 02/26/2020    Current Outpatient Medications  Medication Sig Dispense Refill   amiodarone (PACERONE) 200 MG tablet Take 1 tablet (200 mg) by mouth twice a day for 10 days, then take 1 tablet daily. 40 tablet 6   apixaban (ELIQUIS) 5 MG TABS tablet Take 1 tablet (5 mg total) by mouth 2 (two) times daily. 60 tablet 11   lanthanum (FOSRENOL) 500 MG chewable tablet Chew 1 tablet (500 mg total) by mouth 3 (three) times daily with meals. 90 tablet 6   midodrine (PROAMATINE) 10 MG tablet Take 2 tablets (20 mg total) by mouth 3 (three) times daily with meals. Do NOT hold for dialysis. TAKE DURING DAYLIGHT HOURS ONLY 180 tablet 6   No current facility-administered medications for this encounter.    Vitals:   04/01/21 1156  BP: 104/60  Pulse: 94  SpO2: 95%  Weight: (!) 194.5 kg (428 lb 12.8 oz)   Wt Readings from Last 3 Encounters:  04/01/21 (!) 194.5 kg (428 lb 12.8 oz)  03/25/21 (!) 187 kg (412 lb 4.2 oz)  12/24/20 (!) 206.7 kg (455 lb 12.8 oz)    PHYSICAL EXAM: General: NAD, obese.  Neck: Thick.  No JVD, no thyromegaly or thyroid nodule.  Lungs: Clear to auscultation bilaterally with normal respiratory effort. CV: Nondisplaced PMI.  Heart regular S1/S2, no S3/S4, no murmur.  Trace edema.  No carotid bruit.  Normal pedal pulses.  Abdomen: Soft, nontender, no hepatosplenomegaly, no distention.  Skin: Intact without lesions or rashes.  Neurologic: Alert and oriented x 3.  Psych: Normal affect. Extremities: No clubbing or cyanosis.  HEENT: Normal.    ASSESSMENT & PLAN: 1. Chronic Biventricular  Heart Failure:  Known NICM since 2019, previously followed by [facility].  Echo 8/21 EF < 20%, moderate LV dilation, severely decreased RV function, severe biatrial enlargement, mild MR. No history of ETOH/drugs.  No FH of cardiomyopathy (mother with [place]).  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  He is too large for cardiac MRI. RHC/LHC on 03/04/20 with no CAD low CI at 2.1. Evaluated by EP but not a candidate for ICD given BMI.  Echo in 7/22 with EF < 20%, RV poorly visualized.  Developed cardiogenic shock 7/22 admission  requiring pressors/inotropes and had AKI progressing to ESRD.  He titrated off inotropes/pressors.  Now on midodrine 20 mg tid and stable BP.   - Continue HD for volume control, he is tolerating.  - Continue current midodrine for now, hopefully can decrease in the future.  - Refer for cardiac rehab.   2. Atrial fibrillation: Paroxysmal.  DCCV several times in 8/22 admission.    - Continue amiodarone 200 daily.  Check LFTs and TSH, needs regular eye exam while on amiodarone.  - Continue Eliquis.  3. ESRD: So far, tolerating HD and getting fluid off effectively.    4. OHS/OSA: Using 2 L home oxygen.  He needs CPAP.   - I will see if Dr. Radford Pax can help with this, he says that he did a sleep study in the past and is supposed to be on CPAP.   5. Super Morbid Obesity: I will refer to pharmacy clinic for semaglutide.  He is working on a diet.   Followup in 6 wks with APP.   [name] 04/02/2021

## 2021-04-02 NOTE — Telephone Encounter (Addendum)
Received referral from Dr. [name] to initiate semaglutide for weight loss. Called patient to discuss interest and confirmed he has no contraindications to GLP1 agonist treatment included personal/family history of medullary thyroid carcinoma. He was under the impression treatment would be twice monthly injections. Discussed that semaglutide is a once weekly injection. However, given the shortage of Ozempic and Mancel Parsons currently, we are starting patients on once daily Saxenda, which has no supply chain issues, and titrating up to the max dose before switching to commercially available forms of Wegovy (1.7 mg then 2.4 mg).   Given patient's renal function, will use a slower dose titration. No contraindications for use and no dose adjustments needed, but at higher risk for experiencing adverse effects (GI). Therefore, tentative plan for dose titration will be as follows:  -Saxenda 0.6 mg once daily x 2 weeks -Saxenda 1.2 mg once daily x 2 weeks -Saxenda 1.8 mg once daily x 2 weeks -Saxenda 2.4 mg once daily x 2 weeks -Saxenda 3.0 mg once daily x 4 weeks -Switch to [facility] 1.7 mg weekly for at least 4 weeks, then 2.4 mg weekly  Will slow titration further or stay at a lower dose as needed for tolerability.   Will submit a PA for Saxenda and follow up with the patient pending insurance response and schedule appt with [facility] pharmacy clinic at that time.

## 2021-04-04 DIAGNOSIS — Z992 Dependence on renal dialysis: Secondary | ICD-10-CM | POA: Diagnosis not present

## 2021-04-04 DIAGNOSIS — N178 Other acute kidney failure: Secondary | ICD-10-CM | POA: Diagnosis not present

## 2021-04-04 DIAGNOSIS — N2581 Secondary hyperparathyroidism of renal origin: Secondary | ICD-10-CM | POA: Diagnosis not present

## 2021-04-04 DIAGNOSIS — T8249XD Other complication of vascular dialysis catheter, subsequent encounter: Secondary | ICD-10-CM | POA: Diagnosis not present

## 2021-04-06 NOTE — Telephone Encounter (Signed)
Saxenda denied with the reasoning that weight loss drugs not covered under plan

## 2021-04-07 DIAGNOSIS — N179 Acute kidney failure, unspecified: Secondary | ICD-10-CM | POA: Diagnosis not present

## 2021-04-07 DIAGNOSIS — E662 Morbid (severe) obesity with alveolar hypoventilation: Secondary | ICD-10-CM | POA: Diagnosis not present

## 2021-04-07 DIAGNOSIS — Z992 Dependence on renal dialysis: Secondary | ICD-10-CM | POA: Diagnosis not present

## 2021-04-07 DIAGNOSIS — T8249XD Other complication of vascular dialysis catheter, subsequent encounter: Secondary | ICD-10-CM | POA: Diagnosis not present

## 2021-04-07 DIAGNOSIS — N178 Other acute kidney failure: Secondary | ICD-10-CM | POA: Diagnosis not present

## 2021-04-07 DIAGNOSIS — J9610 Chronic respiratory failure, unspecified whether with hypoxia or hypercapnia: Secondary | ICD-10-CM | POA: Diagnosis not present

## 2021-04-07 DIAGNOSIS — I509 Heart failure, unspecified: Secondary | ICD-10-CM | POA: Diagnosis not present

## 2021-04-07 DIAGNOSIS — I5023 Acute on chronic systolic (congestive) heart failure: Secondary | ICD-10-CM | POA: Diagnosis not present

## 2021-04-07 DIAGNOSIS — N2581 Secondary hyperparathyroidism of renal origin: Secondary | ICD-10-CM | POA: Diagnosis not present

## 2021-04-07 MED ORDER — OZEMPIC (0.25 OR 0.5 MG/DOSE) 2 MG/1.5ML ~~LOC~~ SOPN: 0.2500 mg | PEN_INJECTOR | SUBCUTANEOUS | 2 refills | Status: DC

## 2021-04-07 NOTE — Addendum Note (Signed)
Addended by: Rebbeca Paul B on: 04/07/2021 01:54 PM   Modules accepted: Orders

## 2021-04-07 NOTE — Telephone Encounter (Signed)
Patient's insurance plan does not cover weight loss medications. Patient also has diagnosis of diabetes. Sent Rx for Ozempic to his pharmacy. Tried calling pharmacy to see if this was able to go through without a PA and to confirm cost/availability. Waited on hold for over 30 minutes without speaking with a pharmacy staff member.   Called the patient to let him know that this has been sent in to his pharmacy and to call if he has any issue picking it up. Informed him of copay card to bring cost down to $25/month. Scheduled him for initial visit with pharmacy clinic on Monday 9/26 at 3:30pm. He knows to bring his medication with him to this appointment for first injection training but to otherwise keep it in the fridge.

## 2021-04-08 ENCOUNTER — Telehealth: Payer: Self-pay | Admitting: *Deleted

## 2021-04-09 ENCOUNTER — Telehealth (HOSPITAL_BASED_OUTPATIENT_CLINIC_OR_DEPARTMENT_OTHER): Payer: Self-pay | Admitting: Pharmacist

## 2021-04-09 ENCOUNTER — Other Ambulatory Visit (HOSPITAL_BASED_OUTPATIENT_CLINIC_OR_DEPARTMENT_OTHER): Payer: Self-pay

## 2021-04-09 ENCOUNTER — Other Ambulatory Visit (HOSPITAL_COMMUNITY): Payer: Self-pay

## 2021-04-09 DIAGNOSIS — N178 Other acute kidney failure: Secondary | ICD-10-CM | POA: Diagnosis not present

## 2021-04-09 DIAGNOSIS — T8249XD Other complication of vascular dialysis catheter, subsequent encounter: Secondary | ICD-10-CM | POA: Diagnosis not present

## 2021-04-09 DIAGNOSIS — N2581 Secondary hyperparathyroidism of renal origin: Secondary | ICD-10-CM | POA: Diagnosis not present

## 2021-04-09 DIAGNOSIS — Z992 Dependence on renal dialysis: Secondary | ICD-10-CM | POA: Diagnosis not present

## 2021-04-09 NOTE — Telephone Encounter (Signed)
Pharmacy Transitions of Care Follow-up Telephone Call  Date of discharge: 03/26/2021   Discharge Diagnosis: Heart Failure  How have you been since you were released from the hospital? Doing well since discharge. No medication concerns at this time. Denies side effects, denies s/sx of bleeding. Transfers on medications from [place] confirmed.    Medication changes made at discharge:  - START: amiodarone 200 mg, apixaban 5 mg BID, midodrine 20 mg total   - STOPPED: amoxicillin 500 mg, dapagliflozin 10 mg, entresto 97/103 mg, potassium 20 meq, spironolactone 25 mg, toresmide 40 mg    Medication changes verified by the patient? Yes     Medication Accessibility:  Home Pharmacy: [facility] [phone]   Was the patient provided with refills on discharged medications? Yes   Have all prescriptions been transferred from [facility] to home pharmacy? Completed now   Is the patient able to afford medications? Yes - patient is insured   Medication Review: APIXABAN (ELIQUIS)  Apixaban 5 mg BID - Discussed importance of taking medication around the same time everyday  - Reviewed potential DDIs with patient  - Advised patient of medications to avoid (NSAIDs, ASA)  - Educated that Tylenol (acetaminophen) will be the preferred analgesic to prevent risk of bleeding  - Emphasized importance of monitoring for signs and symptoms of bleeding (abnormal bruising, prolonged bleeding, nose bleeds, bleeding from gums, discolored urine, black tarry stools)  - Advised patient to alert all providers of anticoagulation therapy prior to starting a new medication or having a procedure.   Follow-up Appointments:  PCP f/u appt confirmed? Yes - Scheduled to see Dr. Ronnald Ramp on 04/14/2021   Cardiology f/u appt confirmed? Yes - Scheduled on 04/20/2021  If their condition worsens, is the pt aware to call PCP or go to the Emergency Dept.? Yes  Harriet Pho, PharmD Clinical Pharmacist [facility]  04/09/2021 12:29 PM

## 2021-04-11 DIAGNOSIS — N178 Other acute kidney failure: Secondary | ICD-10-CM | POA: Diagnosis not present

## 2021-04-11 DIAGNOSIS — N2581 Secondary hyperparathyroidism of renal origin: Secondary | ICD-10-CM | POA: Diagnosis not present

## 2021-04-11 DIAGNOSIS — T8249XD Other complication of vascular dialysis catheter, subsequent encounter: Secondary | ICD-10-CM | POA: Diagnosis not present

## 2021-04-11 DIAGNOSIS — Z992 Dependence on renal dialysis: Secondary | ICD-10-CM | POA: Diagnosis not present

## 2021-04-12 ENCOUNTER — Inpatient Hospital Stay (HOSPITAL_COMMUNITY)
Admission: EM | Admit: 2021-04-12 | Discharge: 2021-05-19 | DRG: 314 | Disposition: A | Discharge status: Home / Self Care | Payer: BC Managed Care – PPO | Attending: Family Medicine | Admitting: Family Medicine

## 2021-04-12 ENCOUNTER — Other Ambulatory Visit: Payer: Self-pay

## 2021-04-12 ENCOUNTER — Encounter (HOSPITAL_COMMUNITY): Payer: Self-pay | Admitting: Emergency Medicine

## 2021-04-12 ENCOUNTER — Emergency Department (HOSPITAL_COMMUNITY): Payer: BC Managed Care – PPO

## 2021-04-12 DIAGNOSIS — Z8249 Family history of ischemic heart disease and other diseases of the circulatory system: Secondary | ICD-10-CM

## 2021-04-12 DIAGNOSIS — E875 Hyperkalemia: Secondary | ICD-10-CM | POA: Diagnosis present

## 2021-04-12 DIAGNOSIS — I509 Heart failure, unspecified: Secondary | ICD-10-CM

## 2021-04-12 DIAGNOSIS — I5023 Acute on chronic systolic (congestive) heart failure: Secondary | ICD-10-CM | POA: Diagnosis present

## 2021-04-12 DIAGNOSIS — Z992 Dependence on renal dialysis: Secondary | ICD-10-CM

## 2021-04-12 DIAGNOSIS — Z8616 Personal history of COVID-19: Secondary | ICD-10-CM

## 2021-04-12 DIAGNOSIS — R0602 Shortness of breath: Secondary | ICD-10-CM

## 2021-04-12 DIAGNOSIS — N186 End stage renal disease: Secondary | ICD-10-CM | POA: Diagnosis not present

## 2021-04-12 DIAGNOSIS — Z87891 Personal history of nicotine dependence: Secondary | ICD-10-CM

## 2021-04-12 DIAGNOSIS — D631 Anemia in chronic kidney disease: Secondary | ICD-10-CM | POA: Diagnosis present

## 2021-04-12 DIAGNOSIS — Z6841 Body Mass Index (BMI) 40.0 and over, adult: Secondary | ICD-10-CM

## 2021-04-12 DIAGNOSIS — I4892 Unspecified atrial flutter: Secondary | ICD-10-CM | POA: Diagnosis present

## 2021-04-12 DIAGNOSIS — R7881 Bacteremia: Secondary | ICD-10-CM | POA: Diagnosis present

## 2021-04-12 DIAGNOSIS — N179 Acute kidney failure, unspecified: Secondary | ICD-10-CM | POA: Diagnosis present

## 2021-04-12 DIAGNOSIS — E871 Hypo-osmolality and hyponatremia: Secondary | ICD-10-CM | POA: Diagnosis not present

## 2021-04-12 DIAGNOSIS — M898X9 Other specified disorders of bone, unspecified site: Secondary | ICD-10-CM | POA: Diagnosis present

## 2021-04-12 DIAGNOSIS — B9561 Methicillin susceptible Staphylococcus aureus infection as the cause of diseases classified elsewhere: Secondary | ICD-10-CM | POA: Diagnosis present

## 2021-04-12 DIAGNOSIS — I420 Dilated cardiomyopathy: Secondary | ICD-10-CM | POA: Diagnosis present

## 2021-04-12 DIAGNOSIS — I959 Hypotension, unspecified: Secondary | ICD-10-CM | POA: Diagnosis present

## 2021-04-12 DIAGNOSIS — T80211A Bloodstream infection due to central venous catheter, initial encounter: Principal | ICD-10-CM | POA: Diagnosis present

## 2021-04-12 DIAGNOSIS — Z5329 Procedure and treatment not carried out because of patient's decision for other reasons: Secondary | ICD-10-CM | POA: Diagnosis not present

## 2021-04-12 DIAGNOSIS — L89321 Pressure ulcer of left buttock, stage 1: Secondary | ICD-10-CM | POA: Diagnosis not present

## 2021-04-12 DIAGNOSIS — R0989 Other specified symptoms and signs involving the circulatory and respiratory systems: Secondary | ICD-10-CM

## 2021-04-12 DIAGNOSIS — T40605A Adverse effect of unspecified narcotics, initial encounter: Secondary | ICD-10-CM | POA: Diagnosis not present

## 2021-04-12 DIAGNOSIS — E1169 Type 2 diabetes mellitus with other specified complication: Secondary | ICD-10-CM | POA: Diagnosis present

## 2021-04-12 DIAGNOSIS — J9611 Chronic respiratory failure with hypoxia: Secondary | ICD-10-CM | POA: Diagnosis not present

## 2021-04-12 DIAGNOSIS — Z7985 Long-term (current) use of injectable non-insulin antidiabetic drugs: Secondary | ICD-10-CM

## 2021-04-12 DIAGNOSIS — I132 Hypertensive heart and chronic kidney disease with heart failure and with stage 5 chronic kidney disease, or end stage renal disease: Secondary | ICD-10-CM | POA: Diagnosis present

## 2021-04-12 DIAGNOSIS — J90 Pleural effusion, not elsewhere classified: Secondary | ICD-10-CM

## 2021-04-12 DIAGNOSIS — Z711 Person with feared health complaint in whom no diagnosis is made: Secondary | ICD-10-CM

## 2021-04-12 DIAGNOSIS — J811 Chronic pulmonary edema: Secondary | ICD-10-CM | POA: Diagnosis not present

## 2021-04-12 DIAGNOSIS — Z9119 Patient's noncompliance with other medical treatment and regimen: Secondary | ICD-10-CM

## 2021-04-12 DIAGNOSIS — J9601 Acute respiratory failure with hypoxia: Secondary | ICD-10-CM | POA: Insufficient documentation

## 2021-04-12 DIAGNOSIS — M4628 Osteomyelitis of vertebra, sacral and sacrococcygeal region: Secondary | ICD-10-CM | POA: Diagnosis present

## 2021-04-12 DIAGNOSIS — J9622 Acute and chronic respiratory failure with hypercapnia: Secondary | ICD-10-CM | POA: Diagnosis present

## 2021-04-12 DIAGNOSIS — J9621 Acute and chronic respiratory failure with hypoxia: Secondary | ICD-10-CM | POA: Diagnosis present

## 2021-04-12 DIAGNOSIS — Z515 Encounter for palliative care: Secondary | ICD-10-CM

## 2021-04-12 DIAGNOSIS — K5903 Drug induced constipation: Secondary | ICD-10-CM | POA: Diagnosis not present

## 2021-04-12 DIAGNOSIS — Z789 Other specified health status: Secondary | ICD-10-CM

## 2021-04-12 DIAGNOSIS — Y848 Other medical procedures as the cause of abnormal reaction of the patient, or of later complication, without mention of misadventure at the time of the procedure: Secondary | ICD-10-CM | POA: Diagnosis present

## 2021-04-12 DIAGNOSIS — I517 Cardiomegaly: Secondary | ICD-10-CM | POA: Diagnosis not present

## 2021-04-12 DIAGNOSIS — M25561 Pain in right knee: Secondary | ICD-10-CM | POA: Diagnosis not present

## 2021-04-12 DIAGNOSIS — K219 Gastro-esophageal reflux disease without esophagitis: Secondary | ICD-10-CM | POA: Diagnosis present

## 2021-04-12 DIAGNOSIS — E1122 Type 2 diabetes mellitus with diabetic chronic kidney disease: Secondary | ICD-10-CM | POA: Diagnosis present

## 2021-04-12 DIAGNOSIS — Z79899 Other long term (current) drug therapy: Secondary | ICD-10-CM

## 2021-04-12 DIAGNOSIS — M1A9XX0 Chronic gout, unspecified, without tophus (tophi): Secondary | ICD-10-CM | POA: Diagnosis present

## 2021-04-12 DIAGNOSIS — I480 Paroxysmal atrial fibrillation: Secondary | ICD-10-CM | POA: Diagnosis present

## 2021-04-12 DIAGNOSIS — Z91199 Patient's noncompliance with other medical treatment and regimen due to unspecified reason: Secondary | ICD-10-CM

## 2021-04-12 DIAGNOSIS — I5084 End stage heart failure: Secondary | ICD-10-CM | POA: Diagnosis present

## 2021-04-12 DIAGNOSIS — I9589 Other hypotension: Secondary | ICD-10-CM | POA: Diagnosis present

## 2021-04-12 DIAGNOSIS — Z91119 Patient's noncompliance with dietary regimen due to unspecified reason: Secondary | ICD-10-CM

## 2021-04-12 DIAGNOSIS — I5022 Chronic systolic (congestive) heart failure: Secondary | ICD-10-CM | POA: Diagnosis present

## 2021-04-12 DIAGNOSIS — Z20822 Contact with and (suspected) exposure to covid-19: Secondary | ICD-10-CM | POA: Diagnosis present

## 2021-04-12 DIAGNOSIS — Z9114 Patient's other noncompliance with medication regimen: Secondary | ICD-10-CM

## 2021-04-12 DIAGNOSIS — Z5320 Procedure and treatment not carried out because of patient's decision for unspecified reasons: Secondary | ICD-10-CM | POA: Diagnosis not present

## 2021-04-12 DIAGNOSIS — I5082 Biventricular heart failure: Secondary | ICD-10-CM | POA: Diagnosis present

## 2021-04-12 DIAGNOSIS — Z9981 Dependence on supplemental oxygen: Secondary | ICD-10-CM

## 2021-04-12 DIAGNOSIS — Z9115 Patient's noncompliance with renal dialysis: Secondary | ICD-10-CM

## 2021-04-12 DIAGNOSIS — R5381 Other malaise: Secondary | ICD-10-CM | POA: Diagnosis present

## 2021-04-12 DIAGNOSIS — G4733 Obstructive sleep apnea (adult) (pediatric): Secondary | ICD-10-CM | POA: Diagnosis present

## 2021-04-12 DIAGNOSIS — M25532 Pain in left wrist: Secondary | ICD-10-CM

## 2021-04-12 DIAGNOSIS — Z888 Allergy status to other drugs, medicaments and biological substances status: Secondary | ICD-10-CM

## 2021-04-12 DIAGNOSIS — Z7901 Long term (current) use of anticoagulants: Secondary | ICD-10-CM

## 2021-04-12 HISTORY — DX: Disorder of kidney and ureter, unspecified: N28.9

## 2021-04-12 LAB — COMPREHENSIVE METABOLIC PANEL
ALT: 32 U/L (ref 0–44)
AST: 36 U/L (ref 15–41)
Albumin: 3.3 g/dL — ABNORMAL LOW (ref 3.5–5.0)
Alkaline Phosphatase: 84 U/L (ref 38–126)
Anion gap: 19 — ABNORMAL HIGH (ref 5–15)
BUN: 50 mg/dL — ABNORMAL HIGH (ref 6–20)
CO2: 22 mmol/L (ref 22–32)
Calcium: 8.8 mg/dL — ABNORMAL LOW (ref 8.9–10.3)
Chloride: 86 mmol/L — ABNORMAL LOW (ref 98–111)
Creatinine, Ser: 8.28 mg/dL — ABNORMAL HIGH (ref 0.61–1.24)
GFR, Estimated: 8 mL/min — ABNORMAL LOW (ref >60)
Glucose, Bld: 123 mg/dL — ABNORMAL HIGH (ref 70–99)
Potassium: 4 mmol/L (ref 3.5–5.1)
Sodium: 127 mmol/L — ABNORMAL LOW (ref 135–145)
Total Bilirubin: 1.2 mg/dL (ref 0.3–1.2)
Total Protein: 8.5 g/dL — ABNORMAL HIGH (ref 6.5–8.1)

## 2021-04-12 LAB — CBC WITH DIFFERENTIAL/PLATELET
Abs Immature Granulocytes: 0.08 10*3/uL — ABNORMAL HIGH (ref 0.00–0.07)
Basophils Absolute: 0.1 10*3/uL (ref 0.0–0.1)
Basophils Relative: 0 %
Eosinophils Absolute: 0.1 10*3/uL (ref 0.0–0.5)
Eosinophils Relative: 1 %
HCT: 29.7 % — ABNORMAL LOW (ref 39.0–52.0)
Hemoglobin: 9.9 g/dL — ABNORMAL LOW (ref 13.0–17.0)
Immature Granulocytes: 1 %
Lymphocytes Relative: 10 %
Lymphs Abs: 1.2 10*3/uL (ref 0.7–4.0)
MCH: 32.1 pg (ref 26.0–34.0)
MCHC: 33.3 g/dL (ref 30.0–36.0)
MCV: 96.4 fL (ref 80.0–100.0)
Monocytes Absolute: 1.2 10*3/uL — ABNORMAL HIGH (ref 0.1–1.0)
Monocytes Relative: 10 %
Neutro Abs: 9.2 10*3/uL — ABNORMAL HIGH (ref 1.7–7.7)
Neutrophils Relative %: 78 %
Platelets: 221 10*3/uL (ref 150–400)
RBC: 3.08 MIL/uL — ABNORMAL LOW (ref 4.22–5.81)
RDW: 19.8 % — ABNORMAL HIGH (ref 11.5–15.5)
WBC: 11.9 10*3/uL — ABNORMAL HIGH (ref 4.0–10.5)
nRBC: 0 % (ref 0.0–0.2)

## 2021-04-12 LAB — BRAIN NATRIURETIC PEPTIDE: B Natriuretic Peptide: 658.4 pg/mL — ABNORMAL HIGH (ref 0.0–100.0)

## 2021-04-12 LAB — LIPASE, BLOOD: Lipase: 28 U/L (ref 11–51)

## 2021-04-12 LAB — D-DIMER, QUANTITATIVE: D-Dimer, Quant: 1.93 ug/mL-FEU — ABNORMAL HIGH (ref 0.00–0.50)

## 2021-04-12 LAB — RESP PANEL BY RT-PCR (FLU A&B, COVID) ARPGX2
Influenza A by PCR: NEGATIVE
Influenza B by PCR: NEGATIVE
SARS Coronavirus 2 by RT PCR: NEGATIVE

## 2021-04-12 IMAGING — CR DG CHEST 2V
2 series · 3 of 3 positions shown · non-contrast
Comparison: [DATE] and prior studies

CLINICAL DATA: Acute shortness of breath.

EXAM:
CHEST - 2 VIEW

[chest lat]
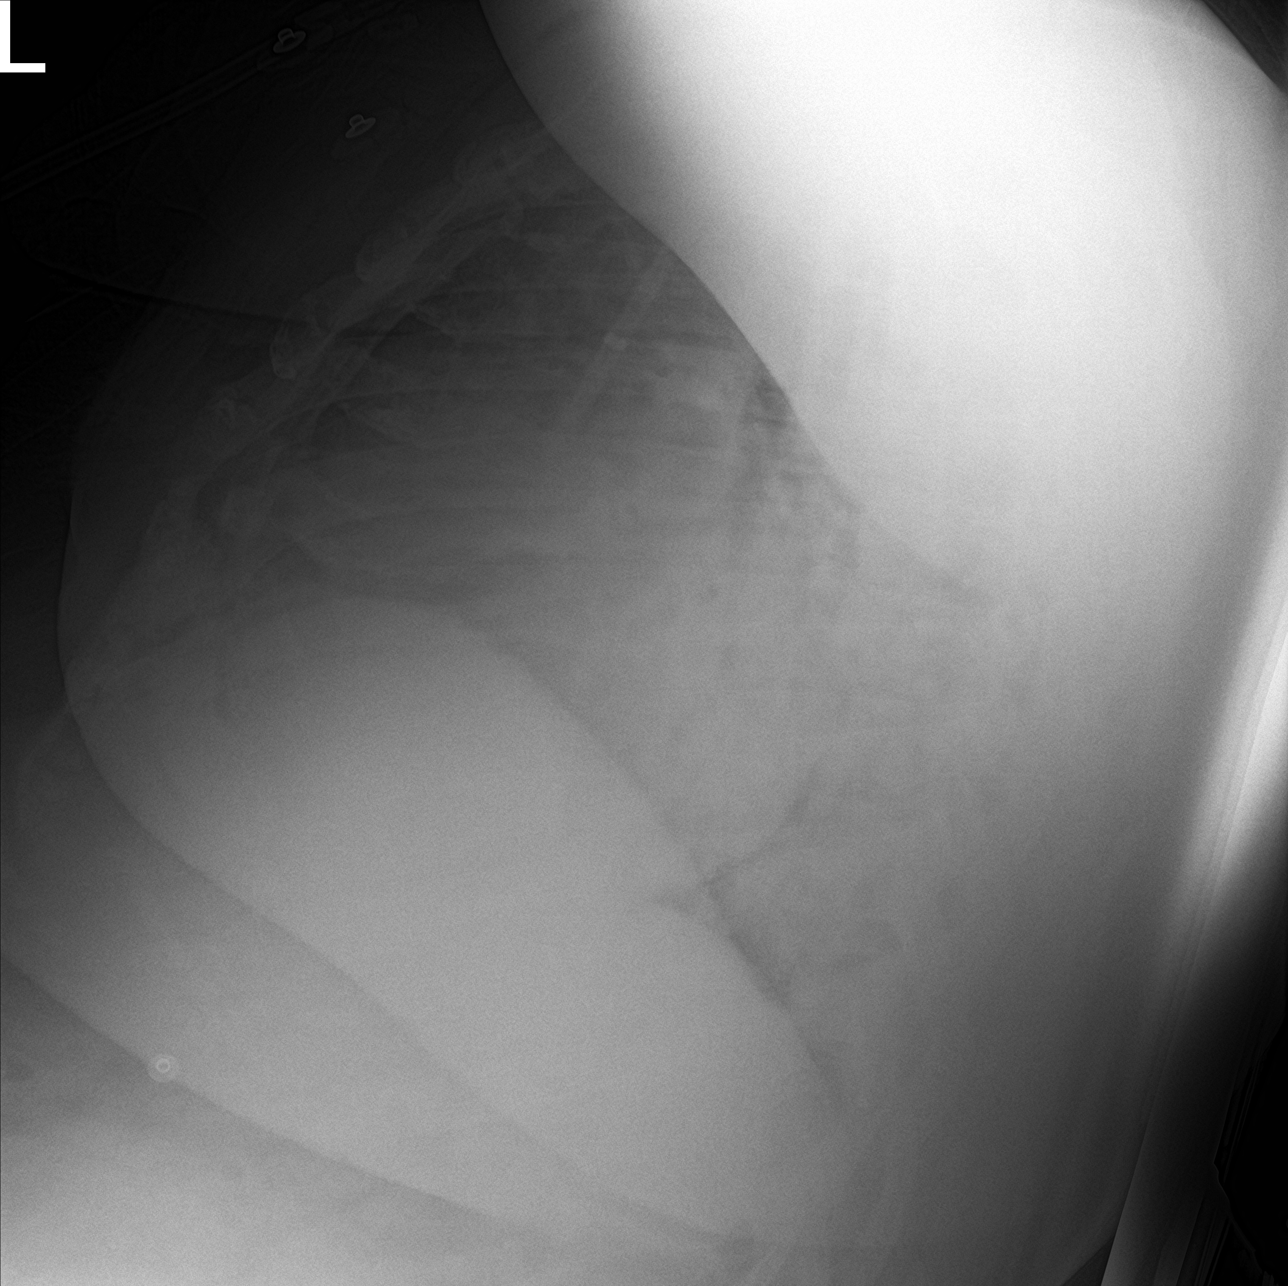

[Series 3: chest ap · 0.14mm/px · 2 of 2 slices shown]
[im 1/2]
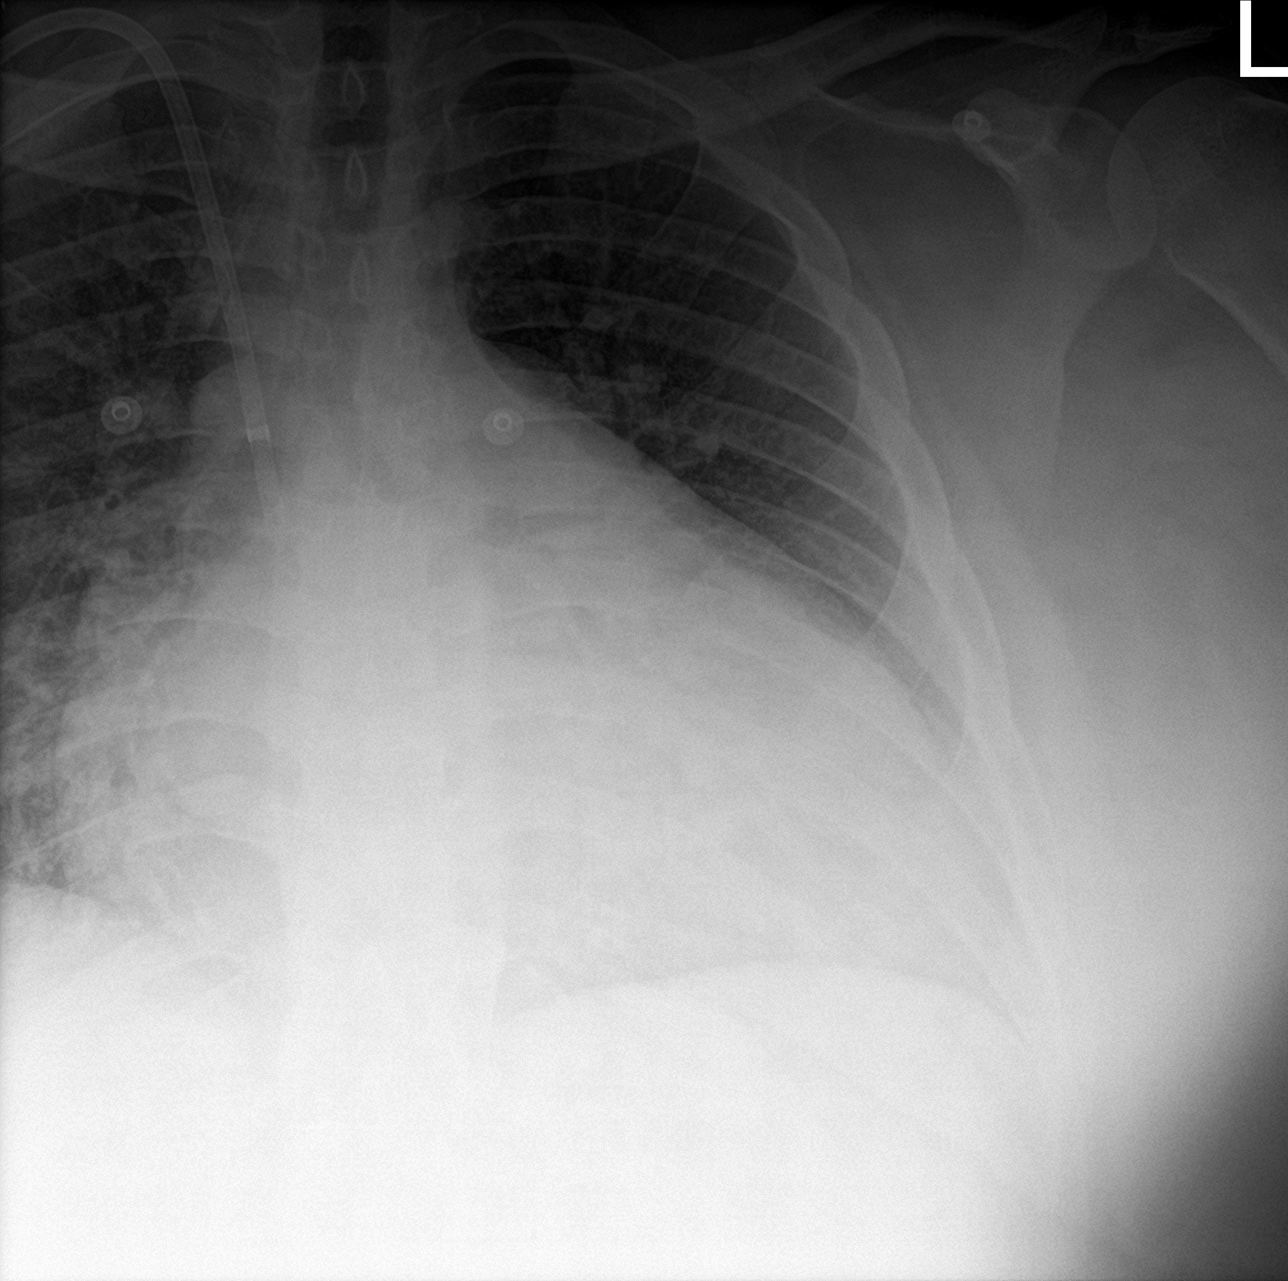
[im 2/2]
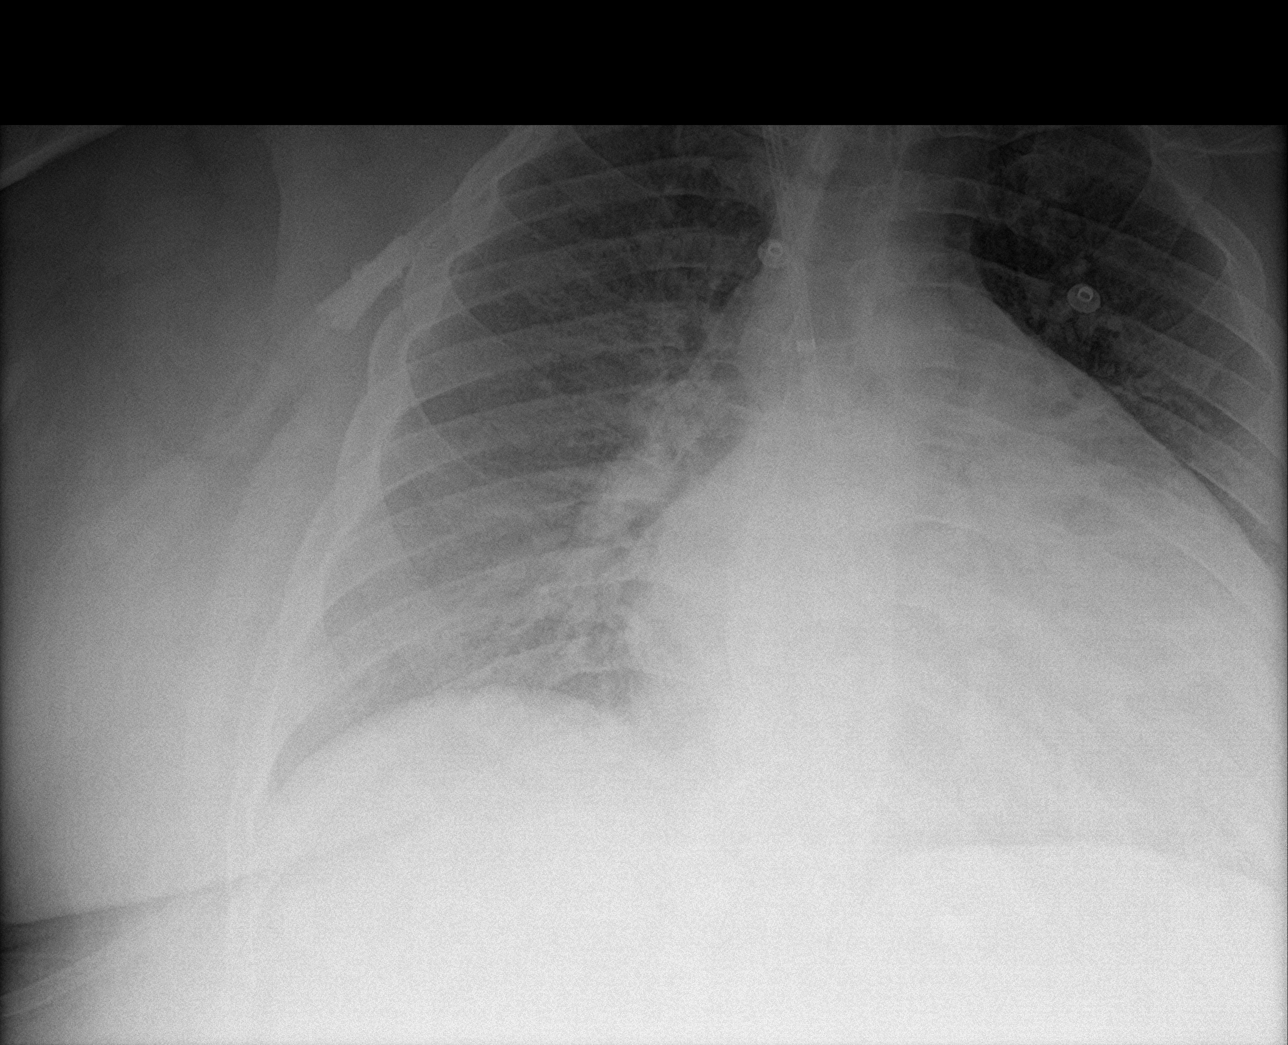

[3 of 3 positions shown; findings below may reference images not displayed]

FINDINGS: Cardiomegaly and RIGHT IJ central venous catheter again noted.

Pulmonary vascular congestion and mild bilateral interstitial
opacities are noted.

No pleural effusion or pneumothorax noted.

No acute bony abnormalities are present.
IMPRESSION: Cardiomegaly with pulmonary vascular congestion and mild bilateral
interstitial opacities/edema.

## 2021-04-12 IMAGING — CR DG KNEE COMPLETE 4+V*R*
4 series · 4 of 4 positions shown · non-contrast
Comparison: None.

CLINICAL DATA: Knee pain.  Shortness of breath.

EXAM:
RIGHT KNEE - COMPLETE 4+ VIEW

[knee ap]
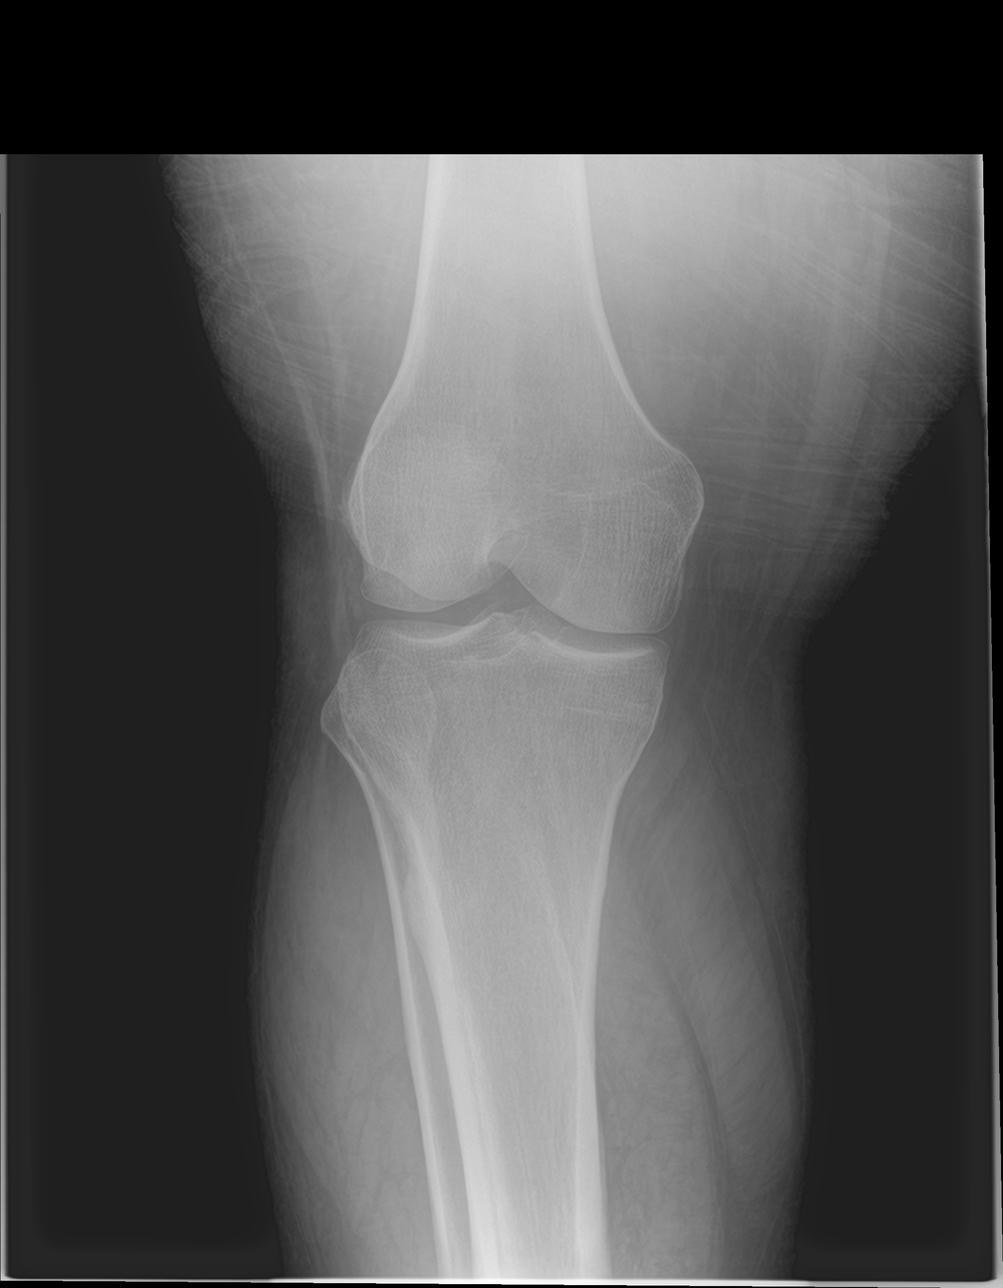

[tunnel]
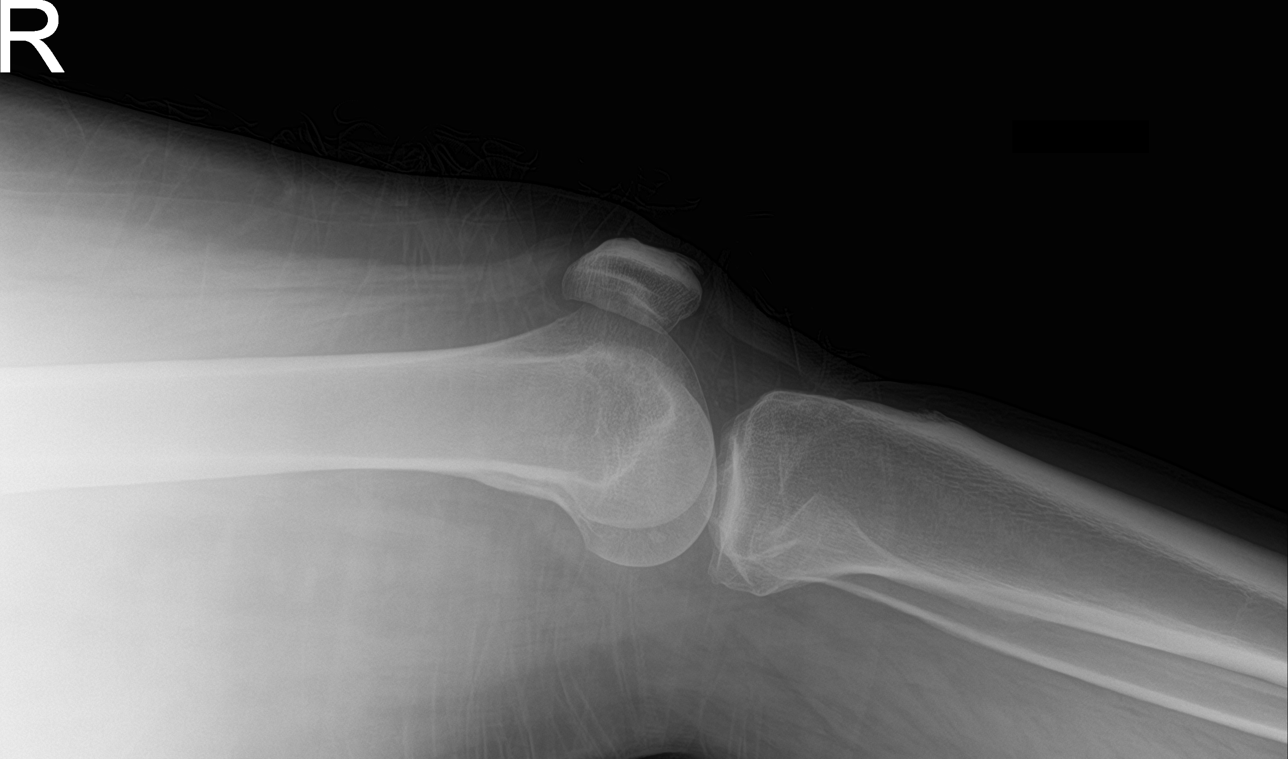

[knee obl (1 of 2)]
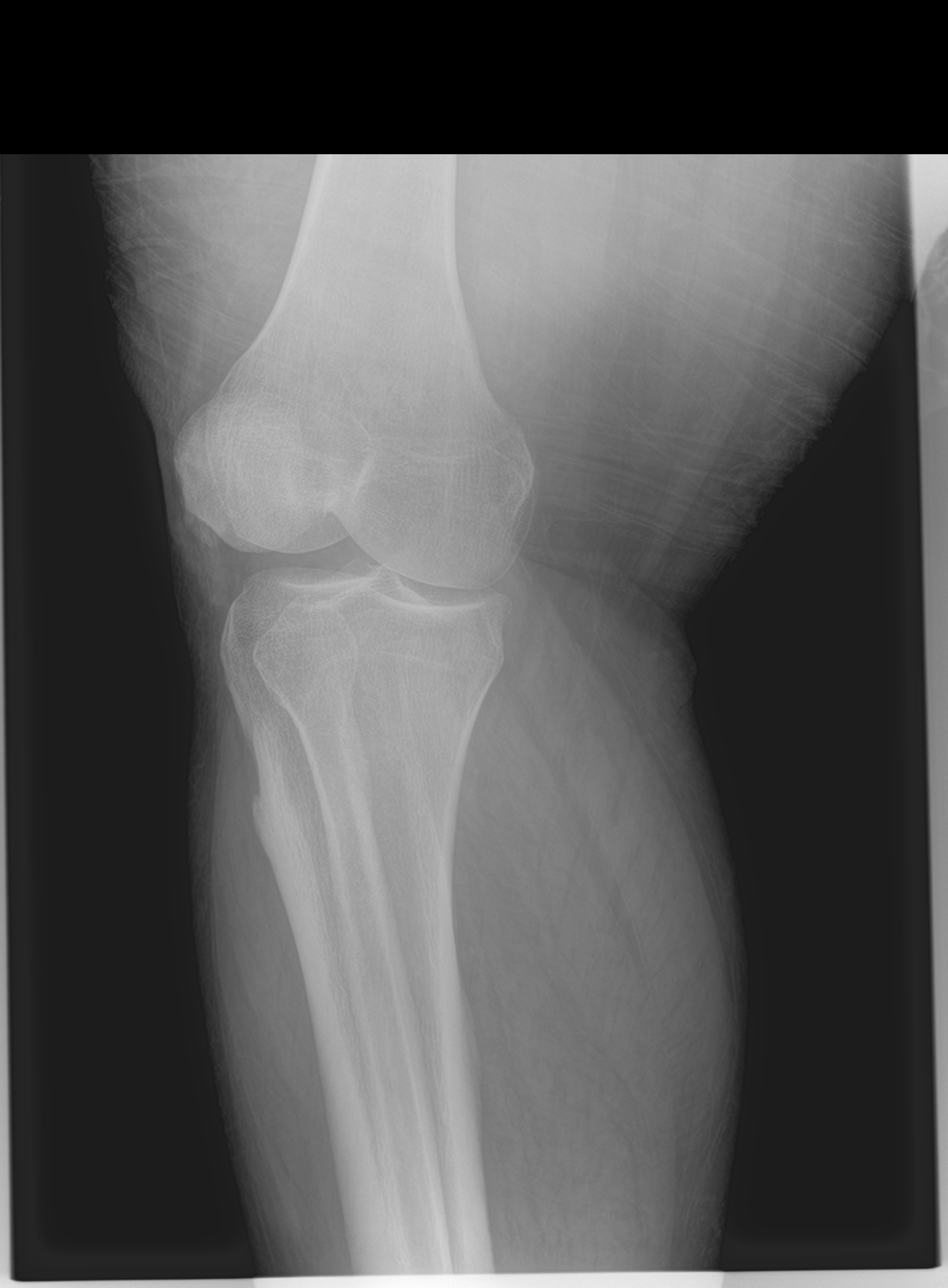

[knee obl (2 of 2)]
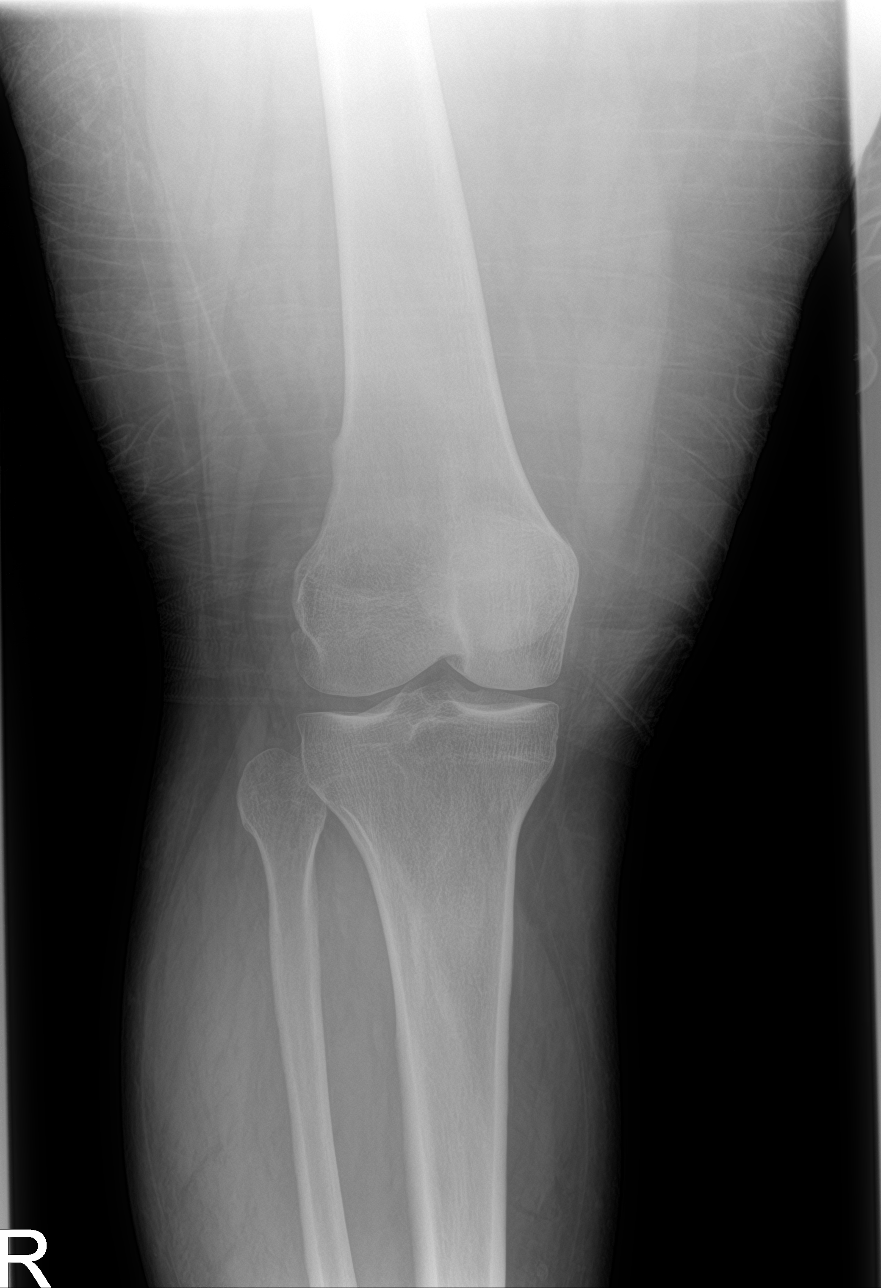

[4 of 4 positions shown; findings below may reference images not displayed]

FINDINGS: No evidence of fracture, dislocation, or joint effusion. No evidence
of arthropathy or other focal bone abnormality. Soft tissues are
unremarkable.
IMPRESSION: Negative.

## 2021-04-12 MED ORDER — ONDANSETRON HCL 4 MG PO TABS
4.0000 mg | ORAL_TABLET | Freq: Four times a day (QID) | ORAL | Status: DC | PRN
  Administered 2021-05-01 – 2021-05-02 (×2): 4 mg via ORAL
  Filled 2021-04-12 (×4): qty 1

## 2021-04-12 MED ORDER — SEMAGLUTIDE(0.25 OR 0.5MG/DOS) 2 MG/1.5ML ~~LOC~~ SOPN: 0.2500 mg | PEN_INJECTOR | SUBCUTANEOUS | Status: DC

## 2021-04-12 MED ORDER — LANTHANUM CARBONATE 500 MG PO CHEW
500.0000 mg | CHEWABLE_TABLET | Freq: Three times a day (TID) | ORAL | Status: DC
  Administered 2021-04-13 – 2021-04-29 (×31): 500 mg via ORAL
  Filled 2021-04-12 (×55): qty 1

## 2021-04-12 MED ORDER — ACETAMINOPHEN 325 MG PO TABS
650.0000 mg | ORAL_TABLET | Freq: Four times a day (QID) | ORAL | Status: DC | PRN
  Filled 2021-04-12 (×5): qty 2

## 2021-04-12 MED ORDER — ONDANSETRON HCL 4 MG/2ML IJ SOLN
4.0000 mg | Freq: Four times a day (QID) | INTRAMUSCULAR | Status: DC | PRN
  Administered 2021-04-23 – 2021-05-11 (×37): 4 mg via INTRAVENOUS
  Filled 2021-04-12 (×39): qty 2

## 2021-04-12 MED ORDER — ACETAMINOPHEN 650 MG RE SUPP: 650.0000 mg | Freq: Four times a day (QID) | RECTAL | Status: DC | PRN

## 2021-04-12 MED ORDER — APIXABAN 5 MG PO TABS
5.0000 mg | ORAL_TABLET | Freq: Two times a day (BID) | ORAL | Status: DC
  Administered 2021-04-13 – 2021-05-18 (×67): 5 mg via ORAL
  Filled 2021-04-12 (×72): qty 1

## 2021-04-12 MED ORDER — OXYCODONE-ACETAMINOPHEN 5-325 MG PO TABS
1.0000 | ORAL_TABLET | Freq: Four times a day (QID) | ORAL | Status: DC | PRN
  Administered 2021-04-13 – 2021-04-21 (×27): 2 via ORAL
  Filled 2021-04-12 (×4): qty 2
  Filled 2021-04-12: qty 1
  Filled 2021-04-12 (×6): qty 2
  Filled 2021-04-12: qty 1
  Filled 2021-04-12 (×12): qty 2
  Filled 2021-04-12: qty 1
  Filled 2021-04-12 (×3): qty 2
  Filled 2021-04-12: qty 1
  Filled 2021-04-12: qty 2

## 2021-04-12 MED ORDER — AMIODARONE HCL 200 MG PO TABS
200.0000 mg | ORAL_TABLET | Freq: Every day | ORAL | Status: DC
  Administered 2021-04-13 – 2021-05-19 (×34): 200 mg via ORAL
  Filled 2021-04-12 (×35): qty 1

## 2021-04-12 MED ORDER — MIDODRINE HCL 5 MG PO TABS
20.0000 mg | ORAL_TABLET | Freq: Three times a day (TID) | ORAL | Status: DC
  Administered 2021-04-13 – 2021-05-19 (×64): 20 mg via ORAL
  Filled 2021-04-12 (×73): qty 4

## 2021-04-12 NOTE — ED Notes (Signed)
2 IV attempts made, IV team order entered

## 2021-04-12 NOTE — ED Provider Notes (Signed)
[facility] EMERGENCY DEPARTMENT Provider Note   CSN: [id number] Arrival date & time: 04/12/21  1535     History Chief Complaint  Patient presents with   Shortness of Breath    Natanel Angotti is a 35 y.o. male.  Patient is a 35 yo male with pmh as seen below, including morbid obesity, ESRD on dialysis on T, TH, Sat-none missed, CHF with EF 20% in 2019 presenting for sob. Patient admits to worsening shortness of breath 5-6 days. Patient normally on 1 L oxygen [place] at home and found himself sating at 78% today. Admits to cough that is always present-at baseline. Denies fevers or chills. Admits to nausea and vomiting that began last Thursday and has continued to today. Denis abdominal pain. Admits to diarrhea that started yesterday.   The history is provided by the patient. No language interpreter was used.  Shortness of Breath Associated symptoms: cough and vomiting   Associated symptoms: no abdominal pain, no chest pain, no ear pain, no fever, no rash and no sore throat       Past Medical History:  Diagnosis Date   Biventricular congestive heart failure ([place])    Last Echo 11/2019 at [facility] reveals EF 20%   Class 3 severe obesity due to excess calories with serious comorbidity and body mass index (BMI) of 50.0 to 59.9 in adult [facility]) 02/26/2020   Essential hypertension 02/26/2020   GERD without esophagitis 02/26/2020   Hidradenitis suppurativa 02/26/2020   OSA (obstructive sleep apnea) 02/26/2020   Prediabetes 02/26/2020   Renal disorder     Patient Active Problem List   Diagnosis Date Noted   Type 2 diabetes mellitus with diabetic chronic kidney disease ([place]) 03/26/2021   Dyspnea    Cardiogenic shock ([name])    Atrial flutter with rapid ventricular response ([place]) 02/11/2021   Elevated troponin 02/11/2021   Respiratory failure with hypoxia ([place]) 02/11/2021   CAP (community acquired pneumonia) 10/08/2020   Acute on chronic systolic congestive heart failure  ([name])    Hypokalemia    CHF (congestive heart failure) ([name])    Chronic cough    Acute on chronic systolic (congestive) heart failure ([place]) 02/26/2020   Morbid obesity with body mass index (BMI) of 60.0 to 69.9 in adult [facility]) 02/26/2020   Essential hypertension 02/26/2020   GERD without esophagitis 02/26/2020   Diuretic-induced hypokalemia 02/26/2020   OSA (obstructive sleep apnea) 02/26/2020   Hidradenitis suppurativa 02/26/2020   Prediabetes 02/26/2020    Past Surgical History:  Procedure Laterality Date   ABSCESS DRAINAGE     IR FLUORO GUIDE CV LINE RIGHT  03/10/2021   IR US GUIDE VASC ACCESS RIGHT  03/10/2021   RIGHT HEART CATH N/A 03/06/2021   Procedure: RIGHT HEART CATH;  Surgeon: Jolaine Artist, MD;  Location: [place] CV LAB;  Service: Cardiovascular;  Laterality: N/A;   RIGHT/LEFT HEART CATH AND CORONARY ANGIOGRAPHY N/A 03/04/2020   Procedure: RIGHT/LEFT HEART CATH AND CORONARY ANGIOGRAPHY;  Surgeon: Larey Dresser, MD;  Location: [place] CV [facility];  Service: Cardiovascular;  Laterality: N/A;   TEMPORARY DIALYSIS CATHETER  03/06/2021   Procedure: TEMPORARY DIALYSIS CATHETER;  Surgeon: Jolaine Artist, MD;  Location: [place] CV LAB;  Service: Cardiovascular;;       Family History  Problem Relation Age of Onset   Heart disease Mother    Hypertension Mother    Pulmonary Hypertension Mother    Drug abuse Father        died  due to Heroin overdose    Social History   Tobacco Use   Smoking status: Former    Packs/day: 1.00    Types: Cigarettes    Quit date: 2019    Years since quitting: 3.7   Smokeless tobacco: Former   Tobacco comments:    quit in 2019  Substance Use Topics   Alcohol use: Never   Drug use: Never    Home Medications Prior to Admission medications   Medication Sig Start Date End Date Taking? Authorizing Provider  amiodarone (PACERONE) 200 MG tablet Take 1 tablet (200 mg) by mouth twice a day for 10 days, then take 1 tablet  daily. 03/26/21   Carollo, Farley Ly, NP  apixaban (ELIQUIS) 5 MG TABS tablet Take 1 tablet (5 mg total) by mouth 2 (two) times daily. 03/26/21   Carollo, Farley Ly, NP  lanthanum (FOSRENOL) 500 MG chewable tablet Chew 1 tablet (500 mg total) by mouth 3 (three) times daily with meals. 03/26/21   Carollo, Farley Ly, NP  midodrine (PROAMATINE) 10 MG tablet Take 2 tablets (20 mg total) by mouth 3 (three) times daily with meals. Do NOT hold for dialysis. TAKE DURING DAYLIGHT HOURS ONLY 03/26/21   Carollo, Farley Ly, NP  Semaglutide,0.25 or 0.'5MG'$ /DOS, (OZEMPIC, 0.25 OR 0.5 MG/DOSE,) 2 MG/1.5ML SOPN Inject 0.25 mg into the skin once a week. Increase as tolerated after 4 weeks to 0.5 mg weekly. 04/07/21   Larey Dresser, MD    Allergies    Coreg [carvedilol] and Heparin  Review of Systems   Review of Systems  Constitutional:  Negative for chills and fever.  HENT:  Negative for ear pain and sore throat.   Eyes:  Negative for pain and visual disturbance.  Respiratory:  Positive for cough and shortness of breath.   Cardiovascular:  Negative for chest pain and palpitations.  Gastrointestinal:  Positive for nausea and vomiting. Negative for abdominal pain.  Genitourinary:  Negative for dysuria and hematuria.  Musculoskeletal:  Negative for arthralgias and back pain.  Skin:  Negative for color change and rash.  Neurological:  Negative for seizures and syncope.  All other systems reviewed and are negative.  Physical Exam Updated Vital Signs BP (!) 98/47   Pulse 89   Temp 98.1 F (36.7 C)   Resp (!) 23   Ht '5\' 11"'$  (1.803 m)   Wt (!) 189.4 kg   SpO2 94%   BMI 58.24 kg/m   Physical Exam Vitals and nursing note reviewed.  Constitutional:      Appearance: He is morbidly obese.  HENT:     Head: Normocephalic and atraumatic.  Eyes:     Conjunctiva/sclera: Conjunctivae normal.  Cardiovascular:     Rate and Rhythm: Normal rate and regular rhythm.     Heart sounds: No murmur heard. Pulmonary:     Effort:  Respiratory distress present.     Breath sounds: Normal breath sounds.     Comments: Hypoxic on arrival Abdominal:     Palpations: Abdomen is soft.     Tenderness: There is no abdominal tenderness.  Musculoskeletal:     Cervical back: Neck supple.     Right lower leg: Edema present.     Left lower leg: Edema present.  Skin:    General: Skin is warm and dry.  Neurological:     Mental Status: He is alert.    ED Results / Procedures / Treatments   Labs (all labs ordered are listed, but only abnormal results  are displayed) Labs Reviewed  COMPREHENSIVE METABOLIC PANEL - Abnormal; Notable for the following components:      Result Value   Sodium 127 (*)    Chloride 86 (*)    Glucose, Bld 123 (*)    BUN 50 (*)    Creatinine, Ser 8.28 (*)    Calcium 8.8 (*)    Total Protein 8.5 (*)    Albumin 3.3 (*)    GFR, Estimated 8 (*)    Anion gap 19 (*)    All other components within normal limits  BRAIN NATRIURETIC PEPTIDE - Abnormal; Notable for the following components:   B Natriuretic Peptide 658.4 (*)    All other components within normal limits  CBC WITH DIFFERENTIAL/PLATELET - Abnormal; Notable for the following components:   WBC 11.9 (*)    RBC 3.08 (*)    Hemoglobin 9.9 (*)    HCT 29.7 (*)    RDW 19.8 (*)    Neutro Abs 9.2 (*)    Monocytes Absolute 1.2 (*)    Abs Immature Granulocytes 0.08 (*)    All other components within normal limits  D-DIMER, QUANTITATIVE - Abnormal; Notable for the following components:   D-Dimer, Quant 1.93 (*)    All other components within normal limits  LIPASE, BLOOD    EKG None  Radiology DG Chest 2 View  Result Date: 04/12/2021 CLINICAL DATA:  Acute shortness of breath. EXAM: CHEST - 2 VIEW COMPARISON:  03/11/2021 and prior studies FINDINGS: Cardiomegaly and RIGHT IJ central venous catheter again noted. Pulmonary vascular congestion and mild bilateral interstitial opacities are noted. No pleural effusion or pneumothorax noted. No acute bony  abnormalities are present. IMPRESSION: Cardiomegaly with pulmonary vascular congestion and mild bilateral interstitial opacities/edema. Electronically Signed   By: Margarette Canada M.D.   On: 04/12/2021 16:45   DG Knee Complete 4 Views Right  Result Date: 04/12/2021 CLINICAL DATA:  Knee pain.  Shortness of breath. EXAM: RIGHT KNEE - COMPLETE 4+ VIEW COMPARISON:  None. FINDINGS: No evidence of fracture, dislocation, or joint effusion. No evidence of arthropathy or other focal bone abnormality. Soft tissues are unremarkable. IMPRESSION: Negative. Electronically Signed   By: Dorise Bullion III M.D.   On: 04/12/2021 16:44    Procedures Procedures   Medications Ordered in ED Medications - No data to display  ED Course  I have reviewed the triage vital signs and the nursing notes.  Pertinent labs & imaging results that were available during my care of the patient were reviewed by me and considered in my medical decision making (see chart for details).    MDM Rules/Calculators/A&P  8:06 PM 35 yo male with pmh as seen below, including dialysis on T, TH, Sat-none missed, presenting for sob. Patient is Aox3 ,no acute distress, afebrile, with home oxygen of 1 L increased to 6 L [place] sating at 94% currently. Concern for fluid overloaded state. CXR demonstrates pulmonary vascular congestion and pleural effusions. Pt states dialysis gave him "too much IVF" due to hx allergic to heparin.   Will wait on recs from heart failure clinic on lasix for CHF exacerbation in dialysis patient.   Patient evaluated by nephrology. Please see notes for further recs.   Patient accepted by hospitalist for hypoxia likely secondary to fluid overloaded state.       Final Clinical Impression(s) / ED Diagnoses Final diagnoses:  Pleural effusion  Pulmonary vascular congestion  Acute respiratory failure with hypoxia ([name])  Morbid obesity ([name])  Acute on chronic congestive  heart failure, unspecified heart failure type  ([place])  ESRD on dialysis [facility])    Rx / DC Orders ED Discharge Orders     None        Lianne Cure, DO [id number]

## 2021-04-12 NOTE — ED Notes (Signed)
ED TO INPATIENT HANDOFF REPORT  ED Nurse Name and Phone #:  [phone]  S Name/Age/Gender Phillip Young 35 y.o. male Room/Bed: 026C/026C  Code Status   Code Status: Full Code  Home/SNF/Other Home Patient oriented to: self, place, time, and situation Is this baseline? Yes   Triage Complete: Triage complete  Chief Complaint Acute on chronic systolic CHF (congestive heart failure) ([place]) [I50.23]  Triage Note History of renal failure.  C/o increased SOB and oxygen requirements x 1 week.  Wears 1 liter [place] normally.  Pt placed on 8 liters [place] on arrival by EMT.  States he is unable to lay down due to SOB.  C/o nausea, vomiting, and fatigue.  Also reports non-traumatic R knee pain.  States he believes it is related to how he has been laying in bed.   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  Admit to Progressive based on following criteria: NEPHROLOGY stable condition requiring close monitoring for AKI, requiring Hemodialysis or Peritoneal Dialysis either from expected electrolyte imbalance, acidosis, or fluid overload that can be managed by NIPPV or high flow oxygen.  May place patient in observation at [facility] or [facility] if equivalent level of care is available:: No  Covid Evaluation: Asymptomatic Screening Protocol (No Symptoms)   Diagnosis: Acute on chronic systolic CHF (congestive heart failure) [id number]  Admitting Physician: Etta Quill [phone]  Attending Physician: Etta Quill [[id number]]          B Medical/Surgery History Past Medical History:  Diagnosis Date   Biventricular congestive heart failure ([place])    Last Echo 11/2019 at [facility] reveals EF 20%   Class 3 severe obesity due to excess calories with serious comorbidity and body mass index (BMI) of 50.0 to 59.9 in adult [facility]) 02/26/2020   Essential hypertension 02/26/2020   GERD without esophagitis 02/26/2020   Hidradenitis suppurativa 02/26/2020   OSA (obstructive sleep apnea) 02/26/2020   Prediabetes 02/26/2020   Renal disorder    Past Surgical History:  Procedure Laterality Date   ABSCESS DRAINAGE     IR FLUORO GUIDE CV LINE RIGHT  03/10/2021   IR US GUIDE VASC ACCESS RIGHT  03/10/2021   RIGHT HEART CATH N/A 03/06/2021   Procedure: RIGHT HEART CATH;  Surgeon: Jolaine Artist, MD;  Location: [place] CV LAB;  Service: Cardiovascular;  Laterality: N/A;   RIGHT/LEFT HEART CATH AND CORONARY ANGIOGRAPHY N/A 03/04/2020   Procedure: RIGHT/LEFT HEART CATH AND CORONARY ANGIOGRAPHY;  Surgeon: Larey Dresser, MD;  Location: [name] CV LAB;  Service: Cardiovascular;  Laterality: N/A;   TEMPORARY DIALYSIS CATHETER  03/06/2021   Procedure: TEMPORARY DIALYSIS CATHETER;  Surgeon: Jolaine Artist, MD;  Location: [place] CV LAB;  Service: Cardiovascular;;     A IV Location/Drains/Wounds Patient Lines/Drains/Airways Status     Active Line/Drains/Airways     Name Placement date Placement time Site Days   Peripheral IV  04/12/21 20 G 1.88" Left;Anterior Forearm 04/12/21  2228  Forearm  less than 1   Hemodialysis Catheter Right Internal jugular Double lumen Permanent (Tunneled) 03/10/21  1441  Internal jugular  33            Intake/Output Last 24 hours No intake or output data in the 24 hours ending 04/12/21  2355  Labs/Imaging Results for orders placed or performed during the hospital encounter of 04/12/21 (from the past 48 hour(s))  Comprehensive metabolic panel     Status: Abnormal   Collection Time: 04/12/21  3:55 PM  Result Value Ref Range   Sodium 127 (L) 135 - 145 mmol/L   Potassium 4.0 3.5 - 5.1 mmol/L   Chloride 86 (L) 98 - 111 mmol/L   CO2 22 22 - 32 mmol/L   Glucose, Bld 123 (H) 70 - 99 mg/dL    Comment: Glucose reference range applies only to samples taken after fasting for at least 8 hours.   BUN 50 (H) 6 - 20 mg/dL   Creatinine, Ser 8.28 (H) 0.61 - 1.24 mg/dL   Calcium 8.8 (L) 8.9 - 10.3 mg/dL   Total Protein 8.5 (H) 6.5 - 8.1 g/dL   Albumin 3.3 (L) 3.5 - 5.0 g/dL   AST 36 15 - 41 U/L   ALT 32 0 - 44 U/L   Alkaline Phosphatase 84 38 - 126 U/L   Total Bilirubin 1.2 0.3 - 1.2 mg/dL   GFR, Estimated 8 (L) >60 mL/min    Comment: (NOTE) Calculated using the CKD-EPI Creatinine Equation (2021)    Anion gap 19 (H) 5 - 15    Comment: Performed at [facility], [place] [address], [place], [place] [address]  Lipase, blood     Status: None   Collection Time: 04/12/21  3:55 PM  Result Value Ref Range   Lipase 28 11 - 51 U/L    Comment: Performed at [facility], [place] [address], [place], [place] [address]  Brain natriuretic peptide     Status: Abnormal   Collection Time: 04/12/21  3:55 PM  Result Value Ref Range   B Natriuretic Peptide 658.4 (H) 0.0 - 100.0 pg/mL    Comment: Performed at [place] [address] [place], [place] [address]  CBC with Differential     Status: Abnormal   Collection Time: 04/12/21  3:55 PM  Result Value Ref Range   WBC 11.9 (H) 4.0 - 10.5 K/uL   RBC 3.08 (L) 4.22 - 5.81 MIL/uL   Hemoglobin 9.9 (L) 13.0 - 17.0 g/dL   HCT 29.7 (L) 39.0 - 52.0 %   MCV 96.4 80.0 - 100.0 fL   MCH 32.1 26.0 - 34.0 pg   MCHC 33.3 30.0 - 36.0 g/dL   RDW 19.8 (H) 11.5 - 15.5 %   Platelets 221 150 - 400 K/uL   nRBC 0.0 0.0 - 0.2 %   Neutrophils  Relative % 78 %   Neutro Abs 9.2 (H) 1.7 - 7.7 K/uL   Lymphocytes Relative 10 %   Lymphs Abs 1.2 0.7 - 4.0 K/uL   Monocytes Relative 10 %   Monocytes Absolute 1.2 (H) 0.1 - 1.0 K/uL   Eosinophils Relative 1 %   Eosinophils Absolute 0.1 0.0 - 0.5 K/uL   Basophils Relative 0 %   Basophils Absolute 0.1 0.0 - 0.1 K/uL   Immature Granulocytes 1 %   Abs Immature Granulocytes 0.08 (H) 0.00 - 0.07 K/uL  Comment: Performed at [facility], [place] [address], [place], [place] [address]  D-dimer, quantitative     Status: Abnormal   Collection Time: 04/12/21  3:55 PM  Result Value Ref Range   D-Dimer, Quant 1.93 (H) 0.00 - 0.50 ug/mL-FEU    Comment: (NOTE) At the manufacturer cut-off value of 0.5 g/mL FEU, this assay has a negative predictive value of 95-100%.This assay is intended for use in conjunction with a clinical pretest probability (PTP) assessment model to exclude pulmonary embolism (PE) and deep venous thrombosis (DVT) in outpatients suspected of PE or DVT. Results should be correlated with clinical presentation. Performed at [facility], [place] [address] [place], [place] [address]   Resp Panel by RT-PCR (Flu A&B, Covid) Nasopharyngeal Swab     Status: None   Collection Time: 04/12/21  8:51 PM   Specimen: Nasopharyngeal Swab; Nasopharyngeal(NP) swabs in vial transport medium  Result Value Ref Range   SARS Coronavirus 2 by RT PCR NEGATIVE NEGATIVE    Comment: (NOTE) SARS-CoV-2 target nucleic acids are NOT DETECTED.  The SARS-CoV-2 RNA is generally detectable in upper respiratory specimens during the acute phase of infection. The lowest concentration of SARS-CoV-2 viral copies this assay can detect is 138 copies/mL. A negative result does not preclude SARS-Cov-2 infection and should not be used as the sole basis for treatment or other patient management decisions. A negative result may occur with  improper specimen collection/handling, submission of specimen  other than nasopharyngeal swab, presence of viral mutation(s) within the areas targeted by this assay, and inadequate number of viral copies(<138 copies/mL). A negative result must be combined with clinical observations, patient history, and epidemiological information. The expected result is Negative.  Fact Sheet for Patients:  EntrepreneurPulse.com.au  Fact Sheet for Healthcare Providers:  IncredibleEmployment.be  This test is no t yet approved or cleared by the [place] FDA and  has been authorized for detection and/or diagnosis of SARS-CoV-2 by FDA under an Emergency Use Authorization (EUA). This EUA will remain  in effect (meaning this test can be used) for the duration of the COVID-19 declaration under Section 564(b)(1) of the Act, 21 U.S.C.section 360bbb-3(b)(1), unless the authorization is terminated  or revoked sooner.       Influenza A by PCR NEGATIVE NEGATIVE   Influenza B by PCR NEGATIVE NEGATIVE    Comment: (NOTE) The Xpert Xpress SARS-CoV-2/FLU/RSV plus assay is intended as an aid in the diagnosis of influenza from Nasopharyngeal swab specimens and should not be used as a sole basis for treatment. Nasal washings and aspirates are unacceptable for Xpert Xpress SARS-CoV-2/FLU/RSV testing.  Fact Sheet for Patients: EntrepreneurPulse.com.au  Fact Sheet for Healthcare Providers: IncredibleEmployment.be  This test is not yet approved or cleared by the [place] FDA and has been authorized for detection and/or diagnosis of SARS-CoV-2 by FDA under an Emergency Use Authorization (EUA). This EUA will remain in effect (meaning this test can be used) for the duration of the COVID-19 declaration under Section 564(b)(1) of the Act, 21 U.S.C. section 360bbb-3(b)(1), unless the authorization is terminated or revoked.  Performed at [facility], [place] [address], [place],  [address]     DG Chest 2 View  Result Date: 04/12/2021 CLINICAL DATA:  Acute shortness of breath. EXAM: CHEST - 2 VIEW COMPARISON:  03/11/2021 and prior studies FINDINGS: Cardiomegaly and RIGHT IJ central venous catheter again noted. Pulmonary vascular congestion and mild bilateral interstitial opacities are noted. No pleural effusion or pneumothorax noted. No  acute bony abnormalities are present. IMPRESSION: Cardiomegaly with pulmonary vascular congestion and mild bilateral interstitial opacities/edema. Electronically Signed   By: Margarette [place] M.D.   On: 04/12/2021 16:45   DG Knee Complete 4 Views Right  Result Date: 04/12/2021 CLINICAL DATA:  Knee pain.  Shortness of breath. EXAM: RIGHT KNEE - COMPLETE 4+ VIEW COMPARISON:  None. FINDINGS: No evidence of fracture, dislocation, or joint effusion. No evidence of arthropathy or other focal bone abnormality. Soft tissues are unremarkable. IMPRESSION: Negative. Electronically Signed   By: Dorise Bullion III M.D.   On: 04/12/2021 16:44    Pending Labs Unresulted Labs (From admission, onward)     Start     Ordered   04/13/21 0500  CBC  Tomorrow morning,   R        04/12/21 2154   04/13/21 [id number]  Basic metabolic panel  Tomorrow morning,   R        04/12/21 2154   04/12/21 2153  HIV Antibody (routine testing w rflx)  (HIV Antibody (Routine testing w reflex) panel)  Once,   STAT        04/12/21 2154            Vitals/Pain Today's Vitals   04/12/21 1930 04/12/21 2000 04/12/21 2030 04/12/21 2100  BP: (!) 82/56 98/69 (!) 102/56 (!) 100/51  Pulse: 85 82  91  Resp: (!) 27 (!) 31 (!) 35 (!) 22  Temp:      TempSrc:      SpO2: 94% 91%  98%  Weight:      Height:      PainSc:        Isolation Precautions Airborne and Contact precautions  Medications Medications  amiodarone (PACERONE) tablet 200 mg (has no administration in time range)  apixaban (ELIQUIS) tablet 5 mg (has no administration in time range)  lanthanum (FOSRENOL) chewable tablet 500 mg  (has no administration in time range)  midodrine (PROAMATINE) tablet 20 mg (has no administration in time range)  acetaminophen (TYLENOL) tablet 650 mg (has no administration in time range)    Or  acetaminophen (TYLENOL) suppository 650 mg (has no administration in time range)  ondansetron (ZOFRAN) tablet 4 mg (has no administration in time range)    Or  ondansetron (ZOFRAN) injection 4 mg (has no administration in time range)  Semaglutide(0.25 or 0.'5MG'$ /DOS) SOPN 0.25 mg (has no administration in time range)  oxyCODONE-acetaminophen (PERCOCET/ROXICET) 5-325 MG per tablet 1-2 tablet (has no administration in time range)    Mobility manual wheelchair Low fall risk   Focused Assessments    R Recommendations: See Admitting Provider Note  Report given to:   Additional Notes:

## 2021-04-12 NOTE — Consult Note (Signed)
Renal Service Consult Note [facility]  Phillip Young 04/12/2021 Sol Blazing, MD Requesting Physician: Dr. Raul Del, ED  Reason for Consult: ESRD pt w/ SOB HPI: The patient is a 35 y.o. year-old w/ hx of biventricular HF (f/b CHF team at [facility]), class 3 obesity, hidradenitis, OSA who was admitted w/ AKI due to cardiogenic shock, rx'd here for 5 weeks starting on 7/20. He developed AKI and required CRRT then iHD, also had dobutamine/ levophed support which was eventually weaned off just at the end of his stay and pt was able to tolerate 4h HD w/ 4 L off of the drips. Therefore he was accepted to OP HD on TTS schedule at [facility]. He was voiding minimal amts (100- 200 cc/ day or less) by the time of discharge. He was dc'd on 03/26/2021. Pt returns to ED now, last HD was yesterday (Sat), c/o ^'d SOB for 1 week, orthopnea, also nausea, vomiting and fatigue. Unable to lay down due to SOB. In ED BP's 80- 104 / 50- 65, on 4 L O2 here (1 L at home), RR 22-30, afebrile. Labs okay for esrd. Asked to see for ESRD.    Pt seen in ED.  Pt states his O2 tank/ generator "ran out" and he felt really bad after HD because of this yesterday.  When EMS came and put him on proper oxygen today, he felt "back to normal".  He has been getting all his HD sessions, they are pulling aroound 4-6 L w/ each session, including yesterday's session. He denies any orthopnea, leg / UE edema, cough at this time. Lying flat in ED.  States his dry wt is around 189kg - 190 kg, and that that got his weight down "about 80 lbs" on this recent 5 week hospital stay.    Also states just today or overnight last night he must have slept on his R knee and L wrist because today is having L wrist and R knee pain and is "not sure he can get around and take care of myself at home" due to these issues. He states he is a "heavy man" and it takes a lot of strengh in the legs especially to get him and down.      ROS - denies CP, no  joint pain, no HA, no blurry vision, no rash, no diarrhea, no nausea/ vomiting, no dysuria, no difficulty voiding   Past Medical History  Past Medical History:  Diagnosis Date   Biventricular congestive heart failure ([place])    Last Echo 11/2019 at [facility] reveals EF 20%   Class 3 severe obesity due to excess calories with serious comorbidity and body mass index (BMI) of 50.0 to 59.9 in adult [facility]) 02/26/2020   Essential hypertension 02/26/2020   GERD without esophagitis 02/26/2020   Hidradenitis suppurativa 02/26/2020   OSA (obstructive sleep apnea) 02/26/2020   Prediabetes 02/26/2020   Renal disorder    Past Surgical History  Past Surgical History:  Procedure Laterality Date   ABSCESS DRAINAGE     IR FLUORO GUIDE CV LINE RIGHT  03/10/2021   IR US GUIDE VASC ACCESS RIGHT  03/10/2021   RIGHT HEART CATH N/A 03/06/2021   Procedure: RIGHT HEART CATH;  Surgeon: Jolaine Artist, MD;  Location: [place] CV LAB;  Service: Cardiovascular;  Laterality: N/A;   RIGHT/LEFT HEART CATH AND CORONARY ANGIOGRAPHY N/A 03/04/2020   Procedure: RIGHT/LEFT HEART CATH AND CORONARY ANGIOGRAPHY;  Surgeon: Larey Dresser, MD;  Location: [facility]  INVASIVE CV LAB;  Service: Cardiovascular;  Laterality: N/A;   TEMPORARY DIALYSIS CATHETER  03/06/2021   Procedure: TEMPORARY DIALYSIS CATHETER;  Surgeon: Jolaine Artist, MD;  Location: [place] CV LAB;  Service: Cardiovascular;;   Family History  Family History  Problem Relation Age of Onset   Heart disease Mother    Hypertension Mother    Pulmonary Hypertension Mother    Drug abuse Father        died due to Heroin overdose   Social History  reports that he quit smoking about 3 years ago. His smoking use included cigarettes. He smoked an average of 1 pack per day. He has quit using smokeless tobacco. He reports that he does not drink alcohol and does not use drugs. 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 Authorizing Provider  amiodarone (PACERONE) 200 MG tablet Take 1 tablet (200 mg) by mouth twice a day for 10 days, then take 1 tablet daily. 03/26/21   Carollo, Farley Ly, NP  apixaban (ELIQUIS) 5 MG TABS tablet Take 1 tablet (5 mg total) by mouth 2 (two) times daily. 03/26/21   Carollo, Farley Ly, NP  lanthanum (FOSRENOL) 500 MG chewable tablet Chew 1 tablet (500 mg total) by mouth 3 (three) times daily with meals. 03/26/21   Carollo, Farley Ly, NP  midodrine (PROAMATINE) 10 MG tablet Take 2 tablets (20 mg total) by mouth 3 (three) times daily with meals. Do NOT hold for dialysis. TAKE DURING DAYLIGHT HOURS ONLY 03/26/21   Carollo, Farley Ly, NP  Semaglutide,0.25 or 0.'5MG'$ /DOS, (OZEMPIC, 0.25 OR 0.5 MG/DOSE,) 2 MG/1.5ML SOPN Inject 0.25 mg into the skin once a week. Increase as tolerated after 4 weeks to 0.5 mg weekly. 04/07/21   Larey Dresser, MD     Vitals:   04/12/21 1930 04/12/21 2000 04/12/21 2030 04/12/21 2100  BP: (!) 82/56 98/69 (!) 102/56 (!) 100/51  Pulse: 85 82  91  Resp: (!) 27 (!) 31 (!) 35 (!) 22  Temp:      TempSrc:      SpO2: 94% 91%  98%  Weight:      Height:       Exam Gen alert, no distress, obese, nasal O2 4 L No rash, cyanosis or gangrene Sclera anicteric, throat clear  No jvd or bruits Chest clear bilat to bases, no rales/ wheezing RRR no MRG Abd soft ntnd no mass or ascites +bs GU normal MS no joint effusions or deformity Ext no LE or UE edema, no wounds or ulcers Neuro is alert, Ox 3 , nf  RIJ TDC     Home meds include - eliquis 5 bid, amiodarone, lanthanum 500 mg ac tid, midodrine '20mg'$  tid      OP HD: TTS [facility]   - get details in am   Assessment/ Plan: SOB - resolved by EMS / ED w/ oxygen. Per patient his O2 generator ran out and w/ the O2 supplied to him here  today he is "back to normal". He states has not missed any HD and they have been getting off the usual amounts w/ each session, around 5-6 L each HD.  He states, "this is not a dialysis problem", and I agree.  ESRD -  on HD TTS. Next HD on schedule Tuesday if still here.  Bivent CHF  Hypotension - on midodrine at high dose 20 tid Volume- no vol excess on exam, clear lungs, lying flat, CXR not convincing for edema.  R knee/ L wrist pain - not sure cause, per pmd Atrial fib - on amio/ eliquis MBD ckd - cont binder Anemia ckd - Hb 9.9 here, stable      Kelly Splinter  MD 04/12/2021, 10:31 PM  Recent Labs  Lab 04/12/21 1555  WBC 11.9*  HGB 9.9*    Recent Labs  Lab 04/12/21 1555  K 4.0  BUN 50*  CREATININE 8.28*  CALCIUM 8.8*

## 2021-04-12 NOTE — ED Triage Notes (Signed)
History of renal failure.  C/o increased SOB and oxygen requirements x 1 week.  Wears 1 liter [place] normally.  Pt placed on 8 liters [place] on arrival by EMT.  States he is unable to lay down due to SOB.  C/o nausea, vomiting, and fatigue.  Also reports non-traumatic R knee pain.  States he believes it is related to how he has been laying in bed.

## 2021-04-12 NOTE — ED Provider Notes (Addendum)
Emergency Medicine Provider Triage Evaluation Note  Phillip Young , a 35 y.o. male  was evaluated in triage.  Pt complains of shortness of breath.  Patient has a history of renal failure and has noticed increased oxygen requirements over the last week or so.  For the last day he is and able to lay flat due to orthopnea.  He reports associated fatigue, nausea, and vomiting.  He denies fever, chills, chest pain, abdominal pain, leg pain, leg swelling. Also complains of right knee pain.   Review of Systems  Positive:  Negative: See above  Physical Exam  There were no vitals taken for this visit. Gen:   Awake, no distress, obese Resp:  Normal effort  MSK:   Moves extremities without difficulty  Other:    Medical Decision Making  Medically screening exam initiated at 3:55 PM.  Appropriate orders placed.  Dayden Ballor was informed that the remainder of the evaluation will be completed by another provider, this initial triage assessment does not replace that evaluation, and the importance of remaining in the ED until their evaluation is complete.     Hendricks Limes, PA-C 04/12/21 1556    Myna Bright M, PA-C [id number]    Lianne Cure, DO [id number]

## 2021-04-12 NOTE — H&P (Addendum)
History and Physical    Phillip Young [id number] DOB: 03-May-1986 DOA: 04/12/2021  PCP: [name], MD  Patient coming from: Home  I have personally briefly reviewed patient's old medical records in [place]  Chief Complaint: SOB  HPI: Phillip Young is a 35 y.o. male with medical history significant of severe biventricular systolic CHF with EF < [id number] as of July, DM2, paroxysmal a.flutter on eliquis, recently new onset ESRD with dialysis TTS.  Pt in hospital 7/20-9/1 with cardiogenic shock, cardiorenal syndrome which unfortunately progressed to ESRD.  Initially pt requiring CVVHD then iHD with pressors, ultimately able to tolerate iHD with just max dose midodrine and discharged on TTS dialysis.  Last dialysis session was Sat.  Took off 1L less than usual per patient.  Pt normally on BIPAP at night and 1L O2 at home during day for OSA.  Pt with worsening SOB for past 5-6 days.  Today noted home O2 sat to be 78%.  Symptoms persistent, initially severe, improved on 4L O2 via [place] here in ED.  SOB is associated with orthopnea.  Questions if home O2 concentrator not functioning.  N/V onset last Thursday, has continued today.  No abd pain, CP, fevers, chills, edema.  Has diarrhea that started yesterday.   ED Course: Sats improved and pt felt back to normal as soon as EMS put him on oxygen.  Currently on 4L in ED.  CXR demonstrates pulmonary edema.  BNP 658.  WBC 11.9k no other SIRS.  COVID and flu neg.  Sodium 127.   Review of Systems: As per HPI, otherwise all review of systems negative.  Past Medical History:  Diagnosis Date   Biventricular congestive heart failure ([place])    Last Echo 11/2019 at [facility] reveals EF 20%   Class 3 severe obesity due to excess calories with serious comorbidity and body mass index (BMI) of 50.0 to 59.9 in adult [facility]) 02/26/2020   Essential hypertension 02/26/2020   GERD without esophagitis 02/26/2020   Hidradenitis  suppurativa 02/26/2020   OSA (obstructive sleep apnea) 02/26/2020   Prediabetes 02/26/2020   Renal disorder     Past Surgical History:  Procedure Laterality Date   ABSCESS DRAINAGE     IR FLUORO GUIDE CV LINE RIGHT  03/10/2021   IR US GUIDE VASC ACCESS RIGHT  03/10/2021   RIGHT HEART CATH N/A 03/06/2021   Procedure: RIGHT HEART CATH;  Surgeon: Jolaine Artist, MD;  Location: [place] CV LAB;  Service: Cardiovascular;  Laterality: N/A;   RIGHT/LEFT HEART CATH AND CORONARY ANGIOGRAPHY N/A 03/04/2020   Procedure: RIGHT/LEFT HEART CATH AND CORONARY ANGIOGRAPHY;  Surgeon: Larey Dresser, MD;  Location: [place] CV LAB;  Service: Cardiovascular;  Laterality: N/A;   TEMPORARY DIALYSIS CATHETER  03/06/2021   Procedure: TEMPORARY DIALYSIS CATHETER;  Surgeon: Jolaine Artist, MD;  Location: [place] CV LAB;  Service: Cardiovascular;;     reports that he quit smoking about 3 years ago. His smoking use included cigarettes. He smoked an average of 1 pack per day. He has quit using smokeless tobacco. He reports that he does not drink alcohol and does not use drugs.  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 Authorizing Provider  amiodarone (PACERONE) 200 MG tablet Take 1 tablet (200 mg) by mouth twice a day for 10 days, then take 1 tablet daily. 03/26/21   Carollo, Farley Ly, NP  apixaban (ELIQUIS) 5 MG TABS tablet Take 1 tablet (5 mg total) by mouth 2 (two) times daily. 03/26/21   Carollo, Farley Ly, NP  lanthanum (FOSRENOL) 500 MG chewable tablet Chew 1 tablet (500 mg total) by mouth 3  (three) times daily with meals. 03/26/21   Carollo, Farley Ly, NP  midodrine (PROAMATINE) 10 MG tablet Take 2 tablets (20 mg total) by mouth 3 (three) times daily with meals. Do NOT hold for dialysis. TAKE DURING DAYLIGHT HOURS ONLY 03/26/21   Carollo, Farley Ly, NP  Semaglutide,0.25 or 0.'5MG'$ /DOS, (OZEMPIC, 0.25 OR 0.5 MG/DOSE,) 2 MG/1.5ML SOPN Inject 0.25 mg into the skin once a week. Increase as tolerated after 4 weeks to 0.5 mg weekly. 04/07/21   Larey Dresser, MD    Physical Exam: Vitals:   04/12/21 1930 04/12/21 2000 04/12/21 2030 04/12/21 2100  BP: (!) 82/56 98/69 (!) 102/56 (!) 100/51  Pulse: 85 82  91  Resp: (!) 27 (!) 31 (!) 35 (!) 22  Temp:      TempSrc:      SpO2: 94% 91%  98%  Weight:      Height:        Constitutional: NAD, calm, comfortable, morbidly obese Eyes: PERRL, lids and conjunctivae normal ENMT: Mucous membranes are moist. Posterior pharynx clear of any exudate or lesions.Normal dentition.  Neck: normal, supple, no masses, no thyromegaly Respiratory: clear to auscultation bilaterally, no wheezing, no crackles. Normal respiratory effort. No accessory muscle use.  Cardiovascular: Regular rate and rhythm, no murmurs / rubs / gallops. No extremity edema. 2+ pedal pulses. No carotid bruits.  Abdomen: no tenderness, no masses palpated. No hepatosplenomegaly. Bowel sounds positive.  Musculoskeletal: no clubbing / cyanosis. No joint deformity upper and lower extremities. Good ROM, no contractures. Normal muscle tone.  Skin: no rashes, lesions, ulcers. No induration Neurologic: CN 2-12 grossly intact. Sensation intact, DTR normal. Strength 5/5 in all 4.  Psychiatric: Normal judgment and insight. Alert and oriented x 3. Normal mood.    Labs on Admission: I have personally reviewed following labs and imaging studies  CBC: Recent Labs  Lab 04/12/21 1555  WBC 11.9*  NEUTROABS 9.2*  HGB 9.9*  HCT 29.7*  MCV 96.4  PLT [id number]   Basic Metabolic Panel: Recent Labs  Lab  04/12/21 1555  NA 127*  K 4.0  CL 86*  CO2 22  GLUCOSE 123*  BUN 50*  CREATININE 8.28*  CALCIUM 8.8*   GFR: Estimated Creatinine Clearance: 21.1 mL/min (A) (by C-G formula based on SCr of 8.28 mg/dL (H)). Liver Function Tests: Recent Labs  Lab 04/12/21 1555  AST 36  ALT 32  ALKPHOS 84  BILITOT 1.2  PROT 8.5*  ALBUMIN 3.3*   Recent Labs  Lab 04/12/21 1555  LIPASE 28   No results for input(s): AMMONIA in the last 168 hours. Coagulation Profile: No results for input(s): INR, PROTIME in the last 168 hours. Cardiac Enzymes: No results for input(s): CKTOTAL, CKMB, CKMBINDEX, TROPONINI in the last 168 hours. BNP (last 3 results) No results for input(s): PROBNP in the last 8760 hours.  HbA1C: No results for input(s): HGBA1C in the last 72 hours. CBG: No results for input(s): GLUCAP in the last 168 hours. Lipid Profile: No results for input(s): CHOL, HDL, LDLCALC, TRIG, CHOLHDL, LDLDIRECT in the last 72 hours. Thyroid Function Tests: No results for input(s): TSH, T4TOTAL, FREET4, T3FREE, THYROIDAB in the last 72 hours. Anemia Panel: No results for input(s): VITAMINB12, FOLATE, FERRITIN, TIBC, IRON, RETICCTPCT in the last 72 hours. Urine analysis:    Component Value Date/Time   COLORURINE AMBER (A) 02/12/2021 0431   APPEARANCEUR CLOUDY (A) 02/12/2021 0431   LABSPEC 1.023 02/12/2021 0431   PHURINE 5.0 02/12/2021 0431   GLUCOSEU NEGATIVE 02/12/2021 0431   HGBUR NEGATIVE 02/12/2021 0431   BILIRUBINUR NEGATIVE 02/12/2021 0431   KETONESUR NEGATIVE 02/12/2021 0431   PROTEINUR 100 (A) 02/12/2021 0431   NITRITE NEGATIVE 02/12/2021 0431   LEUKOCYTESUR MODERATE (A) 02/12/2021 0431    Radiological Exams on Admission: DG Chest 2 View  Result Date: 04/12/2021 CLINICAL DATA:  Acute shortness of breath. EXAM: CHEST - 2 VIEW COMPARISON:  03/11/2021 and prior studies FINDINGS: Cardiomegaly and RIGHT IJ central venous catheter again noted. Pulmonary vascular congestion and mild  bilateral interstitial opacities are noted. No pleural effusion or pneumothorax noted. No acute bony abnormalities are present. IMPRESSION: Cardiomegaly with pulmonary vascular congestion and mild bilateral interstitial opacities/edema. Electronically Signed   By: Margarette Canada M.D.   On: 04/12/2021 16:45   DG Knee Complete 4 Views Right  Result Date: 04/12/2021 CLINICAL DATA:  Knee pain.  Shortness of breath. EXAM: RIGHT KNEE - COMPLETE 4+ VIEW COMPARISON:  None. FINDINGS: No evidence of fracture, dislocation, or joint effusion. No evidence of arthropathy or other focal bone abnormality. Soft tissues are unremarkable. IMPRESSION: Negative. Electronically Signed   By: Dorise Bullion III M.D.   On: 04/12/2021 16:44    EKG: Independently reviewed.  Assessment/Plan Principal Problem:   Chronic respiratory failure with hypoxia ([name]) Active Problems:   Morbid obesity with body mass index (BMI) of 60.0 to 69.9 in adult [facility])   Chronic systolic CHF (congestive heart failure) ([name])   Type 2 diabetes mellitus with diabetic chronic kidney disease ([name])   ESRD (end stage renal disease) ([name])   Paroxysmal atrial flutter ([name])   Hyponatremia    Chronic resp failure with hypoxia - Worsening symptoms apparently due to home O2 not functioning. Getting SW consult for this and possible SNF placement if needed PT/OT as well to evaluate Improved with O2 in ED Nephrology consulting on patient Sounds like they think he will tolerate iHD without need for additional pressors beyond PO midodrine No iHD planned emergently for tonight They actually feel he is euvolemic Think its an O2 concentrator issue Resume midodrine in AM Tele monitor O2 via  ESRD - Nephrology consulting Cont fosrenal PAF - In NSR currently Cont amiodarone Cont eliquis Hyponatremia - Ongoing despite dialysis Poor prognostic indicator per Dr. Elissa Hefty 8/31 note OSA - BIPAP QHS DM2 - start ozempic on tues (currently  battling with insurance company to get authorized as outpt). Knee and wrist pain - Will start PRN percocet for now (if his BP can even tolerate) Need to get him onto a bariatric / cardiac bed, ulcer prevention  DVT prophylaxis: Eliquis Code Status: Full Family Communication: No family in room Disposition Plan: TBD, pending improvement in O2 requirements, nephrology and cards recs Consults called: Dr. Jonnie Finner, Dr. [name] Admission status: Place in [place], [place] Hospitalists  How to contact the  TRH Attending or Consulting provider [place] or covering provider during after hours [place], for this patient?  Check the care team in [facility] and look for a) attending/consulting TRH provider listed and b) the [facility] team listed Log into [email or web address]  Amion Physician Scheduling and messaging for groups and whole hospitals  On call and physician scheduling software for group practices, residents, hospitalists and other medical providers for call, clinic, rotation and shift schedules. OnCall [employer] is a hospital-wide system for scheduling doctors and paging doctors on call. EasyPlot is for scientific plotting and data analysis.  [email or web address]  and use [place]'s universal password to access. If you do not have the password, please contact the hospital operator.  Locate the [facility] provider you are looking for under [facility] and page to a number that you can be directly reached. If you still have difficulty reaching the provider, please page the [facility] (Director on Call) for the Hospitalists listed on amion for assistance.  04/12/2021, 9:59 PM

## 2021-04-12 NOTE — ED Notes (Signed)
Pt states hes unable to keep food or heavy liquids. Pain in rt knee and Lf wrist. Pt stated that after dialysis on Saturday his ears started ringing and felt pressure in his ears and was slowly lowered to the ground. Pt says he is experiencing Body aches and vision loss. 3 assist to bed, pt placed on 4L [place].

## 2021-04-13 ENCOUNTER — Telehealth: Payer: Self-pay | Admitting: *Deleted

## 2021-04-13 DIAGNOSIS — J9621 Acute and chronic respiratory failure with hypoxia: Secondary | ICD-10-CM | POA: Diagnosis not present

## 2021-04-13 DIAGNOSIS — J9611 Chronic respiratory failure with hypoxia: Secondary | ICD-10-CM | POA: Diagnosis not present

## 2021-04-13 LAB — CBC
HCT: 26.4 % — ABNORMAL LOW (ref 39.0–52.0)
Hemoglobin: 8.5 g/dL — ABNORMAL LOW (ref 13.0–17.0)
MCH: 31.3 pg (ref 26.0–34.0)
MCHC: 32.2 g/dL (ref 30.0–36.0)
MCV: 97.1 fL (ref 80.0–100.0)
Platelets: 213 10*3/uL (ref 150–400)
RBC: 2.72 MIL/uL — ABNORMAL LOW (ref 4.22–5.81)
RDW: 19.4 % — ABNORMAL HIGH (ref 11.5–15.5)
WBC: 9.2 10*3/uL (ref 4.0–10.5)
nRBC: 0 % (ref 0.0–0.2)

## 2021-04-13 LAB — BASIC METABOLIC PANEL
Anion gap: 18 — ABNORMAL HIGH (ref 5–15)
BUN: 54 mg/dL — ABNORMAL HIGH (ref 6–20)
CO2: 24 mmol/L (ref 22–32)
Calcium: 8.8 mg/dL — ABNORMAL LOW (ref 8.9–10.3)
Chloride: 85 mmol/L — ABNORMAL LOW (ref 98–111)
Creatinine, Ser: 9.04 mg/dL — ABNORMAL HIGH (ref 0.61–1.24)
GFR, Estimated: 7 mL/min — ABNORMAL LOW (ref >60)
Glucose, Bld: 113 mg/dL — ABNORMAL HIGH (ref 70–99)
Potassium: 3.9 mmol/L (ref 3.5–5.1)
Sodium: 127 mmol/L — ABNORMAL LOW (ref 135–145)

## 2021-04-13 LAB — URIC ACID: Uric Acid, Serum: 9.6 mg/dL — ABNORMAL HIGH (ref 3.7–8.6)

## 2021-04-13 LAB — MRSA NEXT GEN BY PCR, NASAL: MRSA by PCR Next Gen: NOT DETECTED

## 2021-04-13 LAB — HIV ANTIBODY (ROUTINE TESTING W REFLEX): HIV Screen 4th Generation wRfx: NONREACTIVE

## 2021-04-13 MED ORDER — IPRATROPIUM-ALBUTEROL 0.5-2.5 (3) MG/3ML IN SOLN: 3.0000 mL | Freq: Four times a day (QID) | RESPIRATORY_TRACT | Status: DC | PRN

## 2021-04-13 MED ORDER — CHLORHEXIDINE GLUCONATE CLOTH 2 % EX PADS
6.0000 | MEDICATED_PAD | Freq: Every day | CUTANEOUS | Status: DC
  Administered 2021-04-13 – 2021-05-18 (×28): 6 via TOPICAL

## 2021-04-13 MED ORDER — CHLORHEXIDINE GLUCONATE CLOTH 2 % EX PADS
6.0000 | MEDICATED_PAD | Freq: Every day | CUTANEOUS | Status: DC
  Administered 2021-04-13 – 2021-04-25 (×5): 6 via TOPICAL

## 2021-04-13 MED ORDER — ALLOPURINOL 100 MG PO TABS
100.0000 mg | ORAL_TABLET | Freq: Every day | ORAL | Status: DC
  Administered 2021-04-13 – 2021-05-19 (×35): 100 mg via ORAL
  Filled 2021-04-13 (×35): qty 1

## 2021-04-13 MED ORDER — PREDNISONE 20 MG PO TABS: 40.0000 mg | ORAL_TABLET | Freq: Every day | ORAL | Status: DC

## 2021-04-13 MED ORDER — PREDNISONE 20 MG PO TABS
40.0000 mg | ORAL_TABLET | Freq: Every day | ORAL | Status: AC
  Administered 2021-04-13 – 2021-04-17 (×5): 40 mg via ORAL
  Filled 2021-04-13 (×5): qty 2

## 2021-04-13 NOTE — Progress Notes (Signed)
PT Cancellation Note  Patient Details Name: Phillip Young MRN: [id number] DOB: April 23, 1986   Cancelled Treatment:    Reason Eval/Treat Not Completed: Pain limiting ability to participate (pt reports joint pain 10/10 despite medication with percocet and not yet time for more.)   Karson Chicas B Derita Michelsen 04/13/2021, 11:23 AM Bayard Males, PT Acute Rehabilitation Services Pager: [phone] Office: [phone]

## 2021-04-13 NOTE — Evaluation (Signed)
Occupational Therapy Evaluation Patient Details Name: Phillip Young MRN: [id number] DOB: 10-11-1985 Today's Date: 04/13/2021   History of Present Illness Phillip Young is a 35 y.o. male who presented with worsening SOB, sats at 78% and reporting home O2 concentrator not working properly. Pt with recent hospitalization 7/20-9/1 with cardogenic shock, cardiorenal syndrome which unfortunately progressed to ESRD with need for HD. PMH: CHF, DM 2, a flutter, EDRD.   Clinical Impression    PTA, pt discharged to sister's home after recent hospitalization. Pt reports Independent with ADLs and mobility at home without issue prior to this admission, using 1 L O2 at home. Pt presents now with inconsistent reports of pain and functional abilities during session. Pt citing limitations due to L wrist and R knee pain due to likely "sleeping on it wrong" though able to demo ROM/muscle activation at various times. Pt overall Mod A for bed mobility, would not attempt standing (no LE muscle activation felt) so plan to further assess OOB ADLs prior to making DC recs at this time.   Received on 6 L O2, did not want to wear O2 properly due to ear irritation with desats to 89% on RA. Able to wean to 4 L O2 during session. 0/4 DOE   Recommendations for follow up therapy are one component of a multi-disciplinary discharge planning process, led by the attending physician.  Recommendations may be updated based on patient status, additional functional criteria and insurance authorization.   Follow Up Recommendations  Other (comment) (TBD - will need to assess OOB ADL pending pt tolerance)    Equipment Recommendations  Other (comment) (TBD)    Recommendations for Other Services       Precautions / Restrictions Precautions Precautions: Other (comment) Precaution Comments: monitor O2 (wears 1 L O2 at home) Restrictions Weight Bearing Restrictions: No      Mobility Bed Mobility Overal bed mobility: Needs  Assistance Bed Mobility: Supine to Sit;Sit to Supine     Supine to sit: Min assist;HOB elevated Sit to supine: Mod assist   General bed mobility comments: Light assist to advance R LE to EOB. pt able to pull self up via therapist handheld assist. Mod A for R LE back into bed, cues to advance B LE (noted R LE muscle activation with gravitiy minimized). pt able to pull self up in bed using R UE on headboard    Transfers                 General transfer comment: Pt "attempted" to stand with OT and male NT though no LE muscle activation felt to assist with task. declined to even attempt scooting along bedside    Balance Overall balance assessment: Needs assistance Sitting-balance support: No upper extremity supported;Feet supported Sitting balance-Leahy Scale: Good                                     ADL either performed or assessed with clinical judgement   ADL Overall ADL's : Needs assistance/impaired Eating/Feeding: Set up;Sitting   Grooming: Set up;Sitting   Upper Body Bathing: Minimal assistance;Sitting   Lower Body Bathing: Moderate assistance;Sitting/lateral leans   Upper Body Dressing : Minimal assistance;Sitting   Lower Body Dressing: Moderate assistance;Sitting/lateral leans       Toileting- Clothing Manipulation and Hygiene: Moderate assistance;Sitting/lateral lean         General ADL Comments: Self limiting attempts due to reported pain. Likely minor  limitations due to pain/discomfort though R dominant UE not affected     Vision Baseline Vision/History: 0 No visual deficits Ability to See in Adequate Light: 0 Adequate Patient Visual Report: No change from baseline Vision Assessment?: No apparent visual deficits     Perception     Praxis      Pertinent Vitals/Pain Pain Assessment: Faces Faces Pain Scale: Hurts little more Pain Location: L wrist, R knee Pain Descriptors / Indicators: Grimacing;Guarding;Moaning Pain  Intervention(s): Monitored during session;Limited activity within patient's tolerance;Patient requesting pain meds-RN notified     Hand Dominance Right   Extremity/Trunk Assessment Upper Extremity Assessment Upper Extremity Assessment: LUE deficits/detail LUE Deficits / Details: reports of severe L wrist pain and reports unable to move though pt able to demo supination/pronation and flexion/extension (less than full ROM). Per nursing staff, was able to quickly grab emesis bag and hold to mouth with L UE overnight LUE: Unable to fully assess due to pain   Lower Extremity Assessment Lower Extremity Assessment: Defer to PT evaluation   Cervical / Trunk Assessment Cervical / Trunk Assessment: Other exceptions Cervical / Trunk Exceptions: morbid obesity   Communication Communication Communication: No difficulties   Cognition Arousal/Alertness: Awake/alert Behavior During Therapy: WFL for tasks assessed/performed Overall Cognitive Status: Within Functional Limits for tasks assessed                                 General Comments: cognitively [facility], inconsistent reports of pain, functioning, etc than what was presented   General Comments  Received on 6 L O2 - pt declined initial attempts to wean O2 due to 96% readings. Noted to remove O2 throughout session and did not want to wear properly due to it rubbing his ears so would not stay in nostrils. With RA, desats to 89% though did not appear overly SOB. Weaned to 4 LO2 with vitals stable though again, O2 not in nose properly.    Exercises     Shoulder Instructions      Home Living Family/patient expects to be discharged to:: Private residence Living Arrangements: Other relatives (sister) Available Help at Discharge: Family;Available PRN/intermittently Type of Home: House Home Access: Level entry     Home Layout: One level         Bathroom Toilet: Handicapped height     Home Equipment: None   Additional  Comments: 6-7 steps to enter home at [facility] though pt had been staying with sister (home info above)      Prior Functioning/Environment Level of Independence: Independent        Comments: 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 Independent for ADLs/IADLs. Sponge bathing due to dialysis port        OT Problem List: Decreased range of motion;Decreased activity tolerance;Cardiopulmonary status limiting activity;Impaired balance (sitting and/or standing);Pain      OT Treatment/Interventions: Self-care/ADL training;Therapeutic exercise;DME and/or AE instruction;Therapeutic activities;Patient/family education;Balance training;Energy conservation    OT Goals(Current goals can be found in the care plan section) Acute Rehab OT Goals Patient Stated Goal: decrease pain OT Goal Formulation: With patient Time For Goal Achievement: 04/27/21 Potential to Achieve Goals: Fair  OT Frequency: Min 2X/week   Barriers to D/C:            Co-evaluation              AM-PAC OT "6 Clicks" Daily Activity     Outcome Measure Help from another person eating meals?: A Little  Help from another person taking care of personal grooming?: A Little Help from another person toileting, which includes using toliet, bedpan, or urinal?: A Lot Help from another person bathing (including washing, rinsing, drying)?: A Lot Help from another person to put on and taking off regular upper body clothing?: A Little Help from another person to put on and taking off regular lower body clothing?: A Lot 6 Click Score: 15   End of Session Equipment Utilized During Treatment: Oxygen Nurse Communication: Mobility status;Patient requests pain meds  Activity Tolerance: Patient limited by pain;Other (comment) (self limiting) Patient left: in bed;with call bell/phone within reach  OT Visit Diagnosis: Unsteadiness on feet (R26.81);Other abnormalities of gait and mobility (R26.89);Muscle weakness (generalized) (M62.81);Pain Pain -  Right/Left: Left Pain - part of body: Arm (R knee)                Time: [id number] OT Time Calculation (min): 35 min Charges:  OT General Charges $OT Visit: 1 Visit OT Evaluation $OT Eval Moderate Complexity: 1 Mod OT Treatments $Therapeutic Activity: 8-22 mins  Malachy Chamber, OTR/L Acute Rehab Services Office: [phone]   Layla Maw 04/13/2021, 7:51 AM

## 2021-04-13 NOTE — Telephone Encounter (Signed)
Reached out to the patient who is currently admitted in the hospital to ask if he has ever had a sleep study and he states he has never had a sleep study.  Reached out to [employer] and they state the patient does not have a sleep study on file and he has a non-invasive Ventilator.

## 2021-04-13 NOTE — Progress Notes (Signed)
Mobility Specialist: Progress Note   04/13/21 1646  Mobility  Activity Ambulated in room  Level of Assistance Maximum assist, patient does 25-49%  Assistive Device  (HHA)  Distance Ambulated (ft) 32 ft  Mobility Ambulated with assistance in room  Mobility Response Tolerated well  Mobility performed by Mobility specialist  $Mobility charge 1 Mobility   During Mobility: 86-92% SpO2  Pt seated EOB upon entering room and required maxA to stand from EOB. Pt limited d/t pain in in RLE but said his pain medication has helped. Pt pleasantly surprised that he had minimal to no pain when standing and with ambulation. Pt back to bed after walk with call bell and phone at his side.   [facility] Xavion Muscat Mobility Specialist Mobility Specialist Phone: [phone]

## 2021-04-13 NOTE — Progress Notes (Signed)
Currently PROGRESS NOTE    Phillip Young  [id number] DOB: [birth year] DOA: 04/12/2021 PCP: [name], MD    Brief Narrative:  35 year old gentleman with severe biventricular systolic heart failure with known ejection fraction less than 20%, type 2 diabetes, paroxysmal A-flutter on Eliquis and restarted on hemodialysis and recent extensive hospitalization with cardiogenic shock and cardiorenal syndrome progressed to ESRD presents back to the emergency room with shortness of breath, left wrist and right knee hurting.  Unable to ambulate around.  Home oxygen concentrator not working. At the emergency room saturations improved and back on 4 L oxygen.  Chest x-ray with pulmonary edema mostly chronic.  COVID-19 and flu negative.  Unable to ambulate.   Assessment & Plan:   Principal Problem:   Chronic respiratory failure with hypoxia ([name]) Active Problems:   Morbid obesity with body mass index (BMI) of 60.0 to 69.9 in adult [facility])   Chronic systolic CHF (congestive heart failure) ([name])   Type 2 diabetes mellitus with diabetic chronic kidney disease ([name])   ESRD (end stage renal disease) ([name])   Paroxysmal atrial flutter ([name])   Hyponatremia  Chronic respiratory failure with hypoxemia and hypercarbia: -Due to malfunctioning concentrator at home.  Currently on 4 L oxygen and saturating adequate.  Keep on oxygen to keep saturation more than 90%.  Use BiPAP at night without interruption because of underlying obesity hypoventilation, sleep apnea and heart failure.  Stabilized.  Left wrist and right knee pain: No swelling or effusion.  Generalized pain.  Check uric acid.  Prednisone trial.  start allopurinol.  ESRD and hemodialysis: Reportedly stable.  Followed by nephrology.  For dialysis tomorrow.  TTS schedule.  Biventricular heart failure/paroxysmal atrial fibrillation/hypertension: Stable.  Known EF of less than 20%.  Followed by heart failure team.  Intolerance to advanced heart  failure therapies. Midodrine 20 mg twice daily. Fluid management with dialysis.  Not a candidate for ICD as per heart failure team. Currently in sinus rhythm on amiodarone.  Therapeutic on Eliquis.  Morbid obesity: BMI more than 50.  Started on semaglutide.  Ambulatory dysfunction: Work with PT OT.  Refer to SNF.  Unable to manage at home.   DVT prophylaxis:  apixaban (ELIQUIS) tablet 5 mg   Code Status: Full code Family Communication: None Disposition Plan: Status is: Observation  The patient will require care spanning > 2 midnights and should be moved to inpatient because: IV treatments appropriate due to intensity of illness or inability to take PO and Inpatient level of care appropriate due to severity of illness  Dispo: The patient is from: Home              Anticipated d/c is to: SNF              Patient currently is not medically stable to d/c.   Difficult to place patient No         Consultants:  Cardiology Nephrology  Procedures:  None  Antimicrobials:  None   Subjective: Patient seen and examined.  Breathing better.  Mainly focused on pain and stiffness of the left wrist and right knee joint.  He would do better if he was not having pain.  Denies any other complaints.  He does not think he can manage at home.  Objective: Vitals:   04/13/21 0000 04/13/21 0043 04/13/21 0204 04/13/21 0454  BP:  (!) 100/47 (!) 95/58 (!) 93/56  Pulse:  90 83 76  Resp:  (!) 24 (!) 23 (!) 24  Temp:  98.7 F (37.1 C) 98.2 F (36.8 C)   TempSrc: Oral  Oral   SpO2:  97%  96%  Weight:  (!) 188 kg    Height:       No intake or output data in the 24 hours ending 04/13/21 1045 Filed Weights   04/12/21 1829 04/13/21 0043  Weight: (!) 189.4 kg (!) 188 kg    Examination:  General: Looks slightly anxious.  On 4 L oxygen.  Otherwise comfortable. Cardiovascular: Regular rate rhythm.  Difficult to auscultate. Respiratory: No added sounds.  Difficult to  auscultate. Gastrointestinal: Soft and nontender.  Obese and pendulous.  Bowel sounds present. Ext: Chronic edematous legs.  Nonpitting edema. Left wrist joint with some stiffness and crepitus on movement, no joint effusion or swelling. Right knee with tenderness to palpation but no joint effusion or swelling.   Data Reviewed: I have personally reviewed following labs and imaging studies  CBC: Recent Labs  Lab 04/12/21 1555 04/13/21 0432  WBC 11.9* 9.2  NEUTROABS 9.2*  --   HGB 9.9* 8.5*  HCT 29.7* 26.4*  MCV 96.4 97.1  PLT [id number]   Basic Metabolic Panel: Recent Labs  Lab 04/12/21 1555 04/13/21 0432  NA 127* 127*  K 4.0 3.9  CL 86* 85*  CO2 22 24  GLUCOSE 123* 113*  BUN 50* 54*  CREATININE 8.28* 9.04*  CALCIUM 8.8* 8.8*   GFR: Estimated Creatinine Clearance: 19.2 mL/min (A) (by C-G formula based on SCr of 9.04 mg/dL (H)). Liver Function Tests: Recent Labs  Lab 04/12/21 1555  AST 36  ALT 32  ALKPHOS 84  BILITOT 1.2  PROT 8.5*  ALBUMIN 3.3*   Recent Labs  Lab 04/12/21 1555  LIPASE 28   No results for input(s): AMMONIA in the last 168 hours. Coagulation Profile: No results for input(s): INR, PROTIME in the last 168 hours. Cardiac Enzymes: No results for input(s): CKTOTAL, CKMB, CKMBINDEX, TROPONINI in the last 168 hours. BNP (last 3 results) No results for input(s): PROBNP in the last 8760 hours. HbA1C: No results for input(s): HGBA1C in the last 72 hours. CBG: No results for input(s): GLUCAP in the last 168 hours. Lipid Profile: No results for input(s): CHOL, HDL, LDLCALC, TRIG, CHOLHDL, LDLDIRECT in the last 72 hours. Thyroid Function Tests: No results for input(s): TSH, T4TOTAL, FREET4, T3FREE, THYROIDAB in the last 72 hours. Anemia Panel: No results for input(s): VITAMINB12, FOLATE, FERRITIN, TIBC, IRON, RETICCTPCT in the last 72 hours. Sepsis Labs: No results for input(s): PROCALCITON, LATICACIDVEN in the last 168 hours.  Recent Results  (from the past 240 hour(s))  Resp Panel by RT-PCR (Flu A&B, Covid) Nasopharyngeal Swab     Status: None   Collection Time: 04/12/21  8:51 PM   Specimen: Nasopharyngeal Swab; Nasopharyngeal(NP) swabs in vial transport medium  Result Value Ref Range Status   SARS Coronavirus 2 by RT PCR NEGATIVE NEGATIVE Final    Comment: (NOTE) SARS-CoV-2 target nucleic acids are NOT DETECTED.  The SARS-CoV-2 RNA is generally detectable in upper respiratory specimens during the acute phase of infection. The lowest concentration of SARS-CoV-2 viral copies this assay can detect is 138 copies/mL. A negative result does not preclude SARS-Cov-2 infection and should not be used as the sole basis for treatment or other patient management decisions. A negative result may occur with  improper specimen collection/handling, submission of specimen other than nasopharyngeal swab, presence of viral mutation(s) within the areas targeted by this assay, and inadequate number of viral copies(<138 copies/mL). A  negative result must be combined with clinical observations, patient history, and epidemiological information. The expected result is Negative.  Fact Sheet for Patients:  EntrepreneurPulse.com.au  Fact Sheet for Healthcare Providers:  IncredibleEmployment.be  This test is no t yet approved or cleared by the [place] FDA and  has been authorized for detection and/or diagnosis of SARS-CoV-2 by FDA under an Emergency Use Authorization (EUA). This EUA will remain  in effect (meaning this test can be used) for the duration of the COVID-19 declaration under Section 564(b)(1) of the Act, 21 U.S.C.section 360bbb-3(b)(1), unless the authorization is terminated  or revoked sooner.       Influenza A by PCR NEGATIVE NEGATIVE Final   Influenza B by PCR NEGATIVE NEGATIVE Final    Comment: (NOTE) The Xpert Xpress SARS-CoV-2/FLU/RSV plus assay is intended as an aid in the  diagnosis of influenza from Nasopharyngeal swab specimens and should not be used as a sole basis for treatment. Nasal washings and aspirates are unacceptable for Xpert Xpress SARS-CoV-2/FLU/RSV testing.  Fact Sheet for Patients: EntrepreneurPulse.com.au  Fact Sheet for Healthcare Providers: IncredibleEmployment.be  This test is not yet approved or cleared by the [place] FDA and has been authorized for detection and/or diagnosis of SARS-CoV-2 by FDA under an Emergency Use Authorization (EUA). This EUA will remain in effect (meaning this test can be used) for the duration of the COVID-19 declaration under Section 564(b)(1) of the Act, 21 U.S.C. section 360bbb-3(b)(1), unless the authorization is terminated or revoked.  Performed at [facility], [place] [address], [name], [place] [address]   MRSA Next Gen by PCR, Nasal     Status: None   Collection Time: 04/13/21  1:02 AM   Specimen: Nasal Mucosa; Nasal Swab  Result Value Ref Range Status   MRSA by PCR Next Gen NOT DETECTED NOT DETECTED Final    Comment: (NOTE) The GeneXpert MRSA Assay (FDA approved for NASAL specimens only), is one component of a comprehensive MRSA colonization surveillance program. It is not intended to diagnose MRSA infection nor to guide or monitor treatment for MRSA infections. Test performance is not FDA approved in patients less than 60 years old. Performed at [facility], [place] [address] [place], [place] [address]          Radiology Studies: DG Chest 2 View  Result Date: 04/12/2021 CLINICAL DATA:  Acute shortness of breath. EXAM: CHEST - 2 VIEW COMPARISON:  03/11/2021 and prior studies FINDINGS: Cardiomegaly and RIGHT IJ central venous catheter again noted. Pulmonary vascular congestion and mild bilateral interstitial opacities are noted. No pleural effusion or pneumothorax noted. No acute bony abnormalities are present. IMPRESSION: Cardiomegaly  with pulmonary vascular congestion and mild bilateral interstitial opacities/edema. Electronically Signed   By: Margarette Canada M.D.   On: 04/12/2021 16:45   DG Knee Complete 4 Views Right  Result Date: 04/12/2021 CLINICAL DATA:  Knee pain.  Shortness of breath. EXAM: RIGHT KNEE - COMPLETE 4+ VIEW COMPARISON:  None. FINDINGS: No evidence of fracture, dislocation, or joint effusion. No evidence of arthropathy or other focal bone abnormality. Soft tissues are unremarkable. IMPRESSION: Negative. Electronically Signed   By: Dorise Bullion III M.D.   On: 04/12/2021 16:44        Scheduled Meds:  allopurinol  100 mg Oral Daily   amiodarone  200 mg Oral Daily   apixaban  5 mg Oral BID   Chlorhexidine Gluconate Cloth  6 each Topical Daily   lanthanum  500 mg Oral TID  WC   midodrine  20 mg Oral TID WC   predniSONE  40 mg Oral Daily   [START ON 04/14/2021] Semaglutide(0.25 or 0.'5MG'$ /DOS)  0.25 mg Subcutaneous Weekly   Continuous Infusions:   LOS: 0 days    Time spent: 30 minutes    Barb Merino, MD [facility] Pager [phone]

## 2021-04-13 NOTE — Consult Note (Signed)
Advanced Heart Failure Team Consult Note   Primary Physician: [name], MD PCP-Cardiologist:  None  Reason for Consultation: CHF  HPI:    Phillip Young is seen today for evaluation of chronic HF at the request of Dr Alcario Drought.   35 y.o. with history of cardiomyopathy and obesity.  He has been known to have a cardiomyopathy (biventricular failure) since back in 2019, echo in 2019 with EF 20% [facility]).  He was initially followed in the [facility] system.  Admitted to [facility] in [id number] with A/C Systolic HF. Diuresed with IV lasix and transitioned to torsemide. Placed on milrinone with low output and had RHC/LHC. Cath showed no CAD and volume overload.  Blood gas revealed a CO2 level elevated to 78. CPAP was initiated.    He was unable to tolerate Coreg due to wheezing and diarrhea.    Echo in 11/21 showed EF 20-25%, RV poorly visualized.  He was seen by EP, thought to be too large for cardiac MRI.    Patient was admitted in 7/22 with cardiogenic shock and AKI.  He ended up on CVVH.  He had a prolonged hospitalization for volume removal and titration off inotropes/pressors.  Eventually he was able to transition to [facility] and was discharged. Hospitalization was complicated by AF with RVR as well as HIT.  He was cardioverted several times.  Echo in 7/22 showed EF < 20%, severe LV enlargement, normal RV.    I saw the patient on 04/01/21.  He was doing well at that time, down to 1-2 L home oxygen and using Bipap at home.  He was tolerating HD and maintaining weight.  For the last couple days prior to admission, patient reports increased dyspnea, especially worse yesterday.  Additionally, he developed right knee and left ankle pain that became steadily more severe, impairing his ability to walk.  He had HD on Saturday, says that the usual amount of fluid was taken off.  Yesterday, he was very short of breath (at rest) and felt that his oxygen was not working.  No chest pain, no  lightheadedness/syncope.  He was unable to walk due to the right knee pain.  He thinks that his home oxygen concentrator is no working properly.  Oxygen saturation currently in the mid 90s on 4 L oxygen by [place] and his breathing is much better.  Still with joint pains. BP has remained stable and he is in NSR.   Review of Systems: All systems reviewed and negative except as per HPI.   Home Medications Prior to Admission medications   Medication Sig Start Date End Date Taking? Authorizing Provider  amiodarone (PACERONE) 200 MG tablet Take 1 tablet (200 mg) by mouth twice a day for 10 days, then take 1 tablet daily. Patient taking differently: Take 200 mg by mouth daily. 03/26/21  Yes Carollo, Farley Ly, NP  apixaban (ELIQUIS) 5 MG TABS tablet Take 1 tablet (5 mg total) by mouth 2 (two) times daily. 03/26/21  Yes Carollo, Farley Ly, NP  lanthanum (FOSRENOL) 500 MG chewable tablet Chew 1 tablet (500 mg total) by mouth 3 (three) times daily with meals. 03/26/21  Yes Carollo, Farley Ly, NP  midodrine (PROAMATINE) 10 MG tablet Take 2 tablets (20 mg total) by mouth 3 (three) times daily with meals. Do NOT hold for dialysis. TAKE DURING DAYLIGHT HOURS ONLY 03/26/21  Yes Carollo, Farley Ly, NP  Semaglutide,0.25 or 0.'5MG'$ /DOS, (OZEMPIC, 0.25 OR 0.5 MG/DOSE,) 2 MG/1.5ML SOPN Inject 0.25 mg into  the skin once a week. Increase as tolerated after 4 weeks to 0.5 mg weekly. Patient not taking: Reported on 04/12/2021 04/07/21   Larey Dresser, MD    Past Medical History: Past Medical History:  Diagnosis Date   Biventricular congestive heart failure ([place])    Last Echo 11/2019 at [facility] reveals EF 20%   Class 3 severe obesity due to excess calories with serious comorbidity and body mass index (BMI) of 50.0 to 59.9 in adult [facility]) 02/26/2020   Essential hypertension 02/26/2020   GERD without esophagitis 02/26/2020   Hidradenitis suppurativa 02/26/2020   OSA (obstructive sleep apnea) 02/26/2020   Prediabetes 02/26/2020   Renal  disorder     Past Surgical History: Past Surgical History:  Procedure Laterality Date   ABSCESS DRAINAGE     IR FLUORO GUIDE CV LINE RIGHT  03/10/2021   IR US GUIDE VASC ACCESS RIGHT  03/10/2021   RIGHT HEART CATH N/A 03/06/2021   Procedure: RIGHT HEART CATH;  Surgeon: Jolaine Artist, MD;  Location: [place] [facility];  Service: Cardiovascular;  Laterality: N/A;   RIGHT/LEFT HEART CATH AND CORONARY ANGIOGRAPHY N/A 03/04/2020   Procedure: RIGHT/LEFT HEART CATH AND CORONARY ANGIOGRAPHY;  Surgeon: Larey Dresser, MD;  Location: [place] [facility];  Service: Cardiovascular;  Laterality: N/A;   TEMPORARY DIALYSIS CATHETER  03/06/2021   Procedure: TEMPORARY DIALYSIS CATHETER;  Surgeon: Jolaine Artist, MD;  Location: [place] [facility];  Service: Cardiovascular;;    Family History: Family History  Problem Relation Age of Onset   Heart disease Mother    Hypertension Mother    Pulmonary Hypertension Mother    Drug abuse Father        died due to Heroin overdose    Social History: Social History   Socioeconomic History   Marital status: Single    Spouse name: Not on file   Number of children: Not on file   Years of education: Not on file   Highest education level: Not on file  Occupational History   Not on file  Tobacco Use   Smoking status: Former    Packs/day: 1.00    Types: Cigarettes    Quit date: 2019    Years since quitting: 3.7   Smokeless tobacco: Former   Tobacco comments:    quit in 2019  Substance and Sexual Activity   Alcohol use: Never   Drug use: Never   Sexual activity: Not on file  Other Topics Concern   Not on file  Social History Narrative   Not on file   Social Determinants of Health   Financial Resource Strain: Low Risk    Difficulty of Paying Living Expenses: Not very hard  Food Insecurity: Food Insecurity Present   Worried About [place] in the Last Year: Never true   Ran Out of Food in the Last Year: Sometimes true   Transportation Needs: No Transportation Needs   Lack of Transportation (Medical): No   Lack of Transportation (Non-Medical): No  Physical Activity: Not on file  Stress: Not on file  Social Connections: Not on file    Allergies:  Allergies  Allergen Reactions   Coreg [Carvedilol] Shortness Of Breath and Diarrhea    Wheezing    Heparin Other (See Comments)    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, SRA positive    Objective:    Vital Signs:   Temp:  [98.1 F (36.7 C)-98.7 F (37.1 C)] 98.2 F (36.8 C) (09/19 [id number])  Pulse Rate:  [76-98] 76 (09/19 0454) Resp:  [22-35] 24 (09/19 0454) BP: (82-106)/(47-83) 93/56 (09/19 0454) SpO2:  [91 %-98 %] 96 % (09/19 0454) Weight:  [188 kg-189.4 kg] 188 kg (09/19 0043) Last BM Date: 04/12/21  Weight change: Filed Weights   04/12/21 1829 04/13/21 0043  Weight: (!) 189.4 kg (!) 188 kg    Intake/Output:  No intake or output data in the 24 hours ending 04/13/21 0905    Physical Exam    General:  Well appearing. No resp difficulty. Obese HEENT: normal Neck: Thick. JVP 8 cm. Carotids 2+ bilat; no bruits. No lymphadenopathy or thyromegaly appreciated. Cor: PMI nondisplaced. Regular rate & rhythm. No rubs, gallops or murmurs. Lungs: clear Abdomen: soft, nontender, nondistended. No hepatosplenomegaly. No bruits or masses. Good bowel sounds. Extremities: no cyanosis, clubbing, rash. Trace ankle edema.  Neuro: alert & orientedx3, cranial nerves grossly intact. moves all 4 extremities w/o difficulty. Affect pleasant   Telemetry   NSR 70s, personally reviewed.   EKG    NSR, nonspecific T wave flattening (personally reviewed)  Labs   Basic Metabolic Panel: Recent Labs  Lab 04/12/21 1555 04/13/21 0432  NA 127* 127*  K 4.0 3.9  CL 86* 85*  CO2 22 24  GLUCOSE 123* 113*  BUN 50* 54*  CREATININE 8.28* 9.04*  CALCIUM 8.8* 8.8*    Liver Function Tests: Recent Labs  Lab 04/12/21 1555  AST 36  ALT 32  ALKPHOS 84  BILITOT 1.2   PROT 8.5*  ALBUMIN 3.3*   Recent Labs  Lab 04/12/21 1555  LIPASE 28   No results for input(s): AMMONIA in the last 168 hours.  CBC: Recent Labs  Lab 04/12/21 1555 04/13/21 0432  WBC 11.9* 9.2  NEUTROABS 9.2*  --   HGB 9.9* 8.5*  HCT 29.7* 26.4*  MCV 96.4 97.1  PLT 221 213    Cardiac Enzymes: No results for input(s): CKTOTAL, CKMB, CKMBINDEX, TROPONINI in the last 168 hours.  BNP: BNP (last 3 results) Recent Labs    11/07/20 0932 02/11/21 1500 04/12/21 1555  BNP 106.0* 194.3* 658.4*    ProBNP (last 3 results) No results for input(s): PROBNP in the last 8760 hours.   CBG: No results for input(s): GLUCAP in the last 168 hours.  Coagulation Studies: No results for input(s): LABPROT, INR in the last 72 hours.   Imaging   DG Chest 2 View  Result Date: 04/12/2021 CLINICAL DATA:  Acute shortness of breath. EXAM: CHEST - 2 VIEW COMPARISON:  03/11/2021 and prior studies FINDINGS: Cardiomegaly and RIGHT IJ central venous catheter again noted. Pulmonary vascular congestion and mild bilateral interstitial opacities are noted. No pleural effusion or pneumothorax noted. No acute bony abnormalities are present. IMPRESSION: Cardiomegaly with pulmonary vascular congestion and mild bilateral interstitial opacities/edema. Electronically Signed   By: Margarette Canada M.D.   On: 04/12/2021 16:45   DG Knee Complete 4 Views Right  Result Date: 04/12/2021 CLINICAL DATA:  Knee pain.  Shortness of breath. EXAM: RIGHT KNEE - COMPLETE 4+ VIEW COMPARISON:  None. FINDINGS: No evidence of fracture, dislocation, or joint effusion. No evidence of arthropathy or other focal bone abnormality. Soft tissues are unremarkable. IMPRESSION: Negative. Electronically Signed   By: Dorise Bullion III M.D.   On: 04/12/2021 16:44     Medications:     Current Medications:  amiodarone  200 mg Oral Daily   apixaban  5 mg Oral BID   Chlorhexidine Gluconate Cloth  6 each Topical Daily  lanthanum  500 mg  Oral TID WC   midodrine  20 mg Oral TID WC   predniSONE  40 mg Oral Daily   [START ON 04/14/2021] Semaglutide(0.25 or 0.'5MG'$ /DOS)  0.25 mg Subcutaneous Weekly    Infusions:   Assessment/Plan   1. Chronic Biventricular Heart Failure:  Known NICM since 2019, previously followed by [facility].  Echo 8/21 EF < 20%, moderate LV dilation, severely decreased RV function, severe biatrial enlargement, mild MR. No history of ETOH/drugs.  No FH of cardiomyopathy (mother with [place]).  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  He is too large for cardiac MRI. RHC/LHC on 03/04/20 with no CAD low CI at 2.1. Evaluated by EP but not a candidate for ICD given BMI.  Echo in 7/22 with EF < 20%, RV poorly visualized.  Developed cardiogenic shock 7/22 admission requiring pressors/inotropes and had AKI progressing to ESRD.  He titrated off inotropes/pressors.  Now on midodrine 20 mg tid with soft but stable BP.  I think that HF is stable this admission.  He is tolerating HD with good fluid removal, looks only mildly volume overloaded on exam.  Main issues driving admission seem to be malfunction of his home oxygen (not getting enough O2) and joint pain (?gout).   - Continue HD for volume control, renal has seen and plans to keep him on his TTS schedule.  - Continue current midodrine.  2. Atrial fibrillation: Paroxysmal.  DCCV several times in 8/22 admission.   He is in NSR today.  - Continue amiodarone 200 daily.  - Continue Eliquis.  3. ESRD: So far, tolerating HD and getting fluid off effectively.    4. OHS/OSA: Using home oxygen + Bipap.  Home oxygen concentrator apparently was not working and he was not getting adequate oxygen at home, leading to dyspnea.   Currently oxygen saturation in mid-90s on 4L [place].  - Continue supplemental oxygen.  - Will need home health to set up oxygen at home.  5. Super Morbid Obesity: He has started on semaglutide for weight loss.  6. Joint  pain: High suspicion for gout flare.  - Send uric acid.  - Would give prednisone burst.    Length of Stay: 0  [name], MD  04/13/2021, 9:05 AM  Advanced Heart Failure Team Pager [phone] (M-F; 7a - 5p)  Please contact [facility] for night-coverage after hours (4p -7a ) and weekends on [email or web address]

## 2021-04-13 NOTE — Progress Notes (Signed)
Pt refused bipap currently, pt also states he has nausea and has been vomiting. Rt will cont to monitor.

## 2021-04-13 NOTE — Telephone Encounter (Signed)
Reached back out to the patient and he states no one manages his ventilator.

## 2021-04-13 NOTE — Progress Notes (Signed)
Bipap set up for pt, pt stated he can place himself on machine when ready for bed.

## 2021-04-13 NOTE — Plan of Care (Signed)
Initiate Care Plan  Problem: Education: Goal: Knowledge of General Education information will improve Description: Including pain rating scale, medication(s)/side effects and non-pharmacologic comfort measures Outcome: Progressing   Problem: Health Behavior/Discharge Planning: Goal: Ability to manage health-related needs will improve Outcome: Progressing   Problem: Clinical Measurements: Goal: Ability to maintain clinical measurements within normal limits will improve Outcome: Progressing Goal: Will remain free from infection Outcome: Progressing Goal: Diagnostic test results will improve Outcome: Progressing Goal: Respiratory complications will improve Outcome: Progressing Goal: Cardiovascular complication will be avoided Outcome: Progressing   Problem: Activity: Goal: Risk for activity intolerance will decrease Outcome: Progressing   Problem: Nutrition: Goal: Adequate nutrition will be maintained Outcome: Progressing   Problem: Coping: Goal: Level of anxiety will decrease Outcome: Progressing   Problem: Elimination: Goal: Will not experience complications related to bowel motility Outcome: Progressing Goal: Will not experience complications related to urinary retention Outcome: Progressing   Problem: Pain Managment: Goal: General experience of comfort will improve Outcome: Progressing   Problem: Safety: Goal: Ability to remain free from injury will improve Outcome: Progressing   Problem: Skin Integrity: Goal: Risk for impaired skin integrity will decrease Outcome: Progressing   Problem: Education: Goal: Ability to demonstrate management of disease process will improve Outcome: Progressing Goal: Ability to verbalize understanding of medication therapies will improve Outcome: Progressing Goal: Individualized Educational Video(s) Outcome: Progressing   Problem: Activity: Goal: Capacity to carry out activities will improve Outcome: Progressing   Problem:  Cardiac: Goal: Ability to achieve and maintain adequate cardiopulmonary perfusion will improve Outcome: Progressing   Problem: Education: Goal: Knowledge of disease and its progression will improve Outcome: Progressing   Problem: Health Behavior/Discharge Planning: Goal: Ability to manage health-related needs will improve Outcome: Progressing   Problem: Clinical Measurements: Goal: Complications related to the disease process or treatment will be avoided or minimized Outcome: Progressing Goal: Dialysis access will remain free of complications Outcome: Progressing   Problem: Activity: Goal: Activity intolerance will improve Outcome: Progressing   Problem: Fluid Volume: Goal: Fluid volume balance will be maintained or improved Outcome: Progressing   Problem: Nutritional: Goal: Ability to make appropriate dietary choices will improve Outcome: Progressing   Problem: Respiratory: Goal: Respiratory symptoms related to disease process will be avoided Outcome: Progressing   Problem: Self-Concept: Goal: Body image disturbance will be avoided or minimized Outcome: Progressing   Problem: Urinary Elimination: Goal: Progression of disease will be identified and treated Outcome: Progressing

## 2021-04-13 NOTE — Telephone Encounter (Signed)
-----   Message from Sueanne Margarita, MD sent at 04/05/2021 10:33 PM EDT ----- Gae Bon  Can you check into this  I have checked everywhere in Epic I know where to look including [facility] Everywhere and there is no sleep study.  There is no pending order and no canceled or no shows for a sleep study Traci ----- Message ----- From: Larey Dresser, MD Sent: 04/02/2021   2:43 PM EDT To: Sueanne Margarita, MD  Traci, could you check into this patient's CPAP? He says that he is supposed to be getting it and says he had a sleep study, but I cannot find where.

## 2021-04-13 NOTE — Progress Notes (Addendum)
[name] Progress Note  Subjective: seen in room, main c/o is R knee pain  Vitals:   04/13/21 0000 04/13/21 0043 04/13/21 0204 04/13/21 0454  BP:  (!) 100/47 (!) 95/58 (!) 93/56  Pulse:  90 83 76  Resp:  (!) 24 (!) 23 (!) 24  Temp:  98.7 F (37.1 C) 98.2 F (36.8 C)   TempSrc: Oral  Oral   SpO2:  97%  96%  Weight:  (!) 188 kg    Height:        Exam:  alert, nad , obese, nasal O2  Lying flat, no ^wob  no jvd  Chest cta bilat  Cor reg no RG  Abd soft ntnd no ascites   Ext no LE edema   Alert, NF O x3  RIJ TDC      Home meds include - eliquis 5 bid, amiodarone, lanthanum 500 mg ac tid, midodrine '20mg'$  tid      OP HD: TTS/ AKI [facility]    4h  400/800    188.5kg   2/2 bath  TDC RIJ  Hep none   - no in center meds      Na 127  K 3.9  CO2 24  BUN 54  Cr 9.0  Ca 8.8   alb 3.3   Assessment/ Plan: SOB - possibly his O2 equipment ran out or malfunctioned. SOB resolved w/ O2 supplied here. Has not missed any HD and they have been getting off the usual amounts (4-5 L) w/ each session. Minimal vol excess on exam. Plan HD tomorrow.  ESRD - on HD TTS. HD tomorrow. Is at dry wt but will plan 3-4 L UF as tolerated, possibly has lost wt Bivent CHF  Hypotension - cont midodrine at high dose 20 tid Volume- as above no sig vol ^ on exam but Na+ low, set goal 3-4 L for HD tomorrow R knee/ L wrist pain - not sure cause, per pmd Atrial fib - on amio/ eliquis MBD ckd - cont binder Anemia ckd - Hb 9.9 here, stable       Phillip Young 04/13/2021, 10:22 AM   Recent Labs  Lab 04/12/21 1555 04/13/21 0432  K 4.0 3.9  BUN 50* 54*  CREATININE 8.28* 9.04*  CALCIUM 8.8* 8.8*  HGB 9.9* 8.5*   Inpatient medications:  amiodarone  200 mg Oral Daily   apixaban  5 mg Oral BID   Chlorhexidine Gluconate Cloth  6 each Topical Daily   lanthanum  500 mg Oral TID WC   midodrine  20 mg Oral TID WC   predniSONE  40 mg Oral Daily   [START ON 04/14/2021] Semaglutide(0.25 or  0.'5MG'$ /DOS)  0.25 mg Subcutaneous Weekly    acetaminophen **OR** acetaminophen, ipratropium-albuterol, ondansetron **OR** ondansetron (ZOFRAN) IV, oxyCODONE-acetaminophen

## 2021-04-14 ENCOUNTER — Ambulatory Visit: Payer: BC Managed Care – PPO | Admitting: Internal Medicine

## 2021-04-14 DIAGNOSIS — J9621 Acute and chronic respiratory failure with hypoxia: Secondary | ICD-10-CM | POA: Diagnosis not present

## 2021-04-14 DIAGNOSIS — J9611 Chronic respiratory failure with hypoxia: Secondary | ICD-10-CM | POA: Diagnosis not present

## 2021-04-14 LAB — CBC
HCT: 26.9 % — ABNORMAL LOW (ref 39.0–52.0)
Hemoglobin: 8.9 g/dL — ABNORMAL LOW (ref 13.0–17.0)
MCH: 32.5 pg (ref 26.0–34.0)
MCHC: 33.1 g/dL (ref 30.0–36.0)
MCV: 98.2 fL (ref 80.0–100.0)
Platelets: 241 10*3/uL (ref 150–400)
RBC: 2.74 MIL/uL — ABNORMAL LOW (ref 4.22–5.81)
RDW: 19.1 % — ABNORMAL HIGH (ref 11.5–15.5)
WBC: 10 10*3/uL (ref 4.0–10.5)
nRBC: 0 % (ref 0.0–0.2)

## 2021-04-14 LAB — RENAL FUNCTION PANEL
Albumin: 3 g/dL — ABNORMAL LOW (ref 3.5–5.0)
Anion gap: 22 — ABNORMAL HIGH (ref 5–15)
BUN: 75 mg/dL — ABNORMAL HIGH (ref 6–20)
CO2: 23 mmol/L (ref 22–32)
Calcium: 8.4 mg/dL — ABNORMAL LOW (ref 8.9–10.3)
Chloride: 84 mmol/L — ABNORMAL LOW (ref 98–111)
Creatinine, Ser: 9.96 mg/dL — ABNORMAL HIGH (ref 0.61–1.24)
GFR, Estimated: 6 mL/min — ABNORMAL LOW (ref >60)
Glucose, Bld: 133 mg/dL — ABNORMAL HIGH (ref 70–99)
Phosphorus: 8.7 mg/dL — ABNORMAL HIGH (ref 2.5–4.6)
Potassium: 3.8 mmol/L (ref 3.5–5.1)
Sodium: 129 mmol/L — ABNORMAL LOW (ref 135–145)

## 2021-04-14 LAB — HEPATITIS B SURFACE ANTIGEN: Hepatitis B Surface Ag: NONREACTIVE

## 2021-04-14 LAB — HEPATITIS B SURFACE ANTIBODY,QUALITATIVE: Hep B S Ab: REACTIVE — AB

## 2021-04-14 MED ORDER — CYCLOBENZAPRINE HCL 5 MG PO TABS
5.0000 mg | ORAL_TABLET | Freq: Three times a day (TID) | ORAL | Status: DC | PRN
  Administered 2021-04-16 – 2021-05-07 (×6): 5 mg via ORAL
  Filled 2021-04-14 (×6): qty 1

## 2021-04-14 MED ORDER — ANTICOAGULANT SODIUM CITRATE 4% (200MG/5ML) IV SOLN
5.0000 mL | Status: DC | PRN
  Administered 2021-04-14 – 2021-04-24 (×3): 3.8 mL
  Administered 2021-04-25 – 2021-05-19 (×2): 5 mL
  Filled 2021-04-14 (×7): qty 5
  Filled 2021-04-14: qty 3.8
  Filled 2021-04-14 (×2): qty 5

## 2021-04-14 MED ORDER — COLCHICINE 0.3 MG HALF TABLET
0.3000 mg | ORAL_TABLET | Freq: Once | ORAL | Status: AC
  Administered 2021-04-14: 0.3 mg via ORAL
  Filled 2021-04-14: qty 1

## 2021-04-14 NOTE — TOC CM/SW Note (Signed)
HF TOC CM received call from [place], [place]. They have attempted 3 times to deliver pt's oxygen concentrator (refillable) to his home. The pt or family has pushed back date to latest date of delivery 9/26. TOC CM contacted pt's mother, [name] and explained they have been refusing [place] to deliver his needed oxygen concentrator. Educated pt's mother on the importance of pt receiving his concentrator today to the home. Explained [place] will arrange time with her for delivery of oxygen prior to pt's dc from hospital. [name] mother states she will be home and verbalized understanding.    [place], Heart Failure TOC CM [phone]

## 2021-04-14 NOTE — Plan of Care (Signed)
  Problem: Education: Goal: Knowledge of General Education information will improve Description: Including pain rating scale, medication(s)/side effects and non-pharmacologic comfort measures Outcome: Progressing   Problem: Health Behavior/Discharge Planning: Goal: Ability to manage health-related needs will improve Outcome: Progressing   Problem: Clinical Measurements: Goal: Ability to maintain clinical measurements within normal limits will improve Outcome: Progressing Goal: Will remain free from infection Outcome: Progressing Goal: Cardiovascular complication will be avoided Outcome: Progressing   Problem: Activity: Goal: Risk for activity intolerance will decrease Outcome: Progressing   Problem: Pain Managment: Goal: General experience of comfort will improve Outcome: Progressing   Problem: Skin Integrity: Goal: Risk for impaired skin integrity will decrease Outcome: Progressing

## 2021-04-14 NOTE — Progress Notes (Addendum)
Advanced Heart Failure Rounding Note  PCP-Cardiologist: None   Subjective:    Complaining of knee pain. Uric acid 9.6. Placed on prednisone.   Denies SOB.  Objective:   Weight Range: (!) 188 kg Body mass index is 57.81 kg/m.   Vital Signs:   Temp:  [97.7 F (36.5 C)-98.2 F (36.8 C)] 97.8 F (36.6 C) (09/20 0717) Pulse Rate:  [67-80] 67 (09/20 0717) Resp:  [19-24] 19 (09/20 0717) BP: (99-131)/(64-80) 99/69 (09/20 0717) SpO2:  [92 %-95 %] 94 % (09/20 0717) Last BM Date: 04/12/21  Weight change: Filed Weights   04/12/21 1829 04/13/21 0043  Weight: (!) 189.4 kg (!) 188 kg    Intake/Output:   Intake/Output Summary (Last 24 hours) at 04/14/2021 0726 Last data filed at 04/13/2021 2201 Gross per 24 hour  Intake 956 ml  Output 250 ml  Net 706 ml      Physical Exam    General:  Well appearing. No resp difficulty HEENT: Normal Neck: Supple. JVP difficult to assess due body habitus. Carotids 2+ bilat; no bruits. No lymphadenopathy or thyromegaly appreciated. Cor: PMI nondisplaced. Regular rate & rhythm. No rubs, gallops or murmurs. R upper chest HD catheter Lungs: Clear on 4 liters [place] Abdomen: obese, Soft, nontender, nondistended. No hepatosplenomegaly. No bruits or masses. Good bowel sounds. Extremities: No cyanosis, clubbing, rash, edema Neuro: Alert & orientedx3, cranial nerves grossly intact. moves all 4 extremities w/o difficulty. Affect pleasant   Telemetry   SR 80s personally reviewed  Labs    CBC Recent Labs    04/12/21 1555 04/13/21 0432  WBC 11.9* 9.2  NEUTROABS 9.2*  --   HGB 9.9* 8.5*  HCT 29.7* 26.4*  MCV 96.4 97.1  PLT 221 [id number]   Basic Metabolic Panel Recent Labs    04/12/21 1555 04/13/21 0432  NA 127* 127*  K 4.0 3.9  CL 86* 85*  CO2 22 24  GLUCOSE 123* 113*  BUN 50* 54*  CREATININE 8.28* 9.04*  CALCIUM 8.8* 8.8*   Liver Function Tests Recent Labs    04/12/21 1555  AST 36  ALT 32  ALKPHOS 84  BILITOT 1.2  PROT 8.5*   ALBUMIN 3.3*   Recent Labs    04/12/21 1555  LIPASE 28   Cardiac Enzymes No results for input(s): CKTOTAL, CKMB, CKMBINDEX, TROPONINI in the last 72 hours.  BNP: BNP (last 3 results) Recent Labs    11/07/20 0932 02/11/21 1500 04/12/21 1555  BNP 106.0* 194.3* 658.4*    ProBNP (last 3 results) No results for input(s): PROBNP in the last 8760 hours.   D-Dimer Recent Labs    04/12/21 1555  DDIMER 1.93*   Hemoglobin A1C No results for input(s): HGBA1C in the last 72 hours. Fasting Lipid Panel No results for input(s): CHOL, HDL, LDLCALC, TRIG, CHOLHDL, LDLDIRECT in the last 72 hours. Thyroid Function Tests No results for input(s): TSH, T4TOTAL, T3FREE, THYROIDAB in the last 72 hours.  Invalid input(s): FREET3  Other results:   Imaging    No results found.   Medications:     Scheduled Medications:  allopurinol  100 mg Oral Daily   amiodarone  200 mg Oral Daily   apixaban  5 mg Oral BID   Chlorhexidine Gluconate Cloth  6 each Topical Daily   Chlorhexidine Gluconate Cloth  6 each Topical Q0600   lanthanum  500 mg Oral TID WC   midodrine  20 mg Oral TID WC   predniSONE  40 mg Oral Daily  Semaglutide(0.25 or 0.'5MG'$ /DOS)  0.25 mg Subcutaneous Weekly    Infusions:   PRN Medications: acetaminophen **OR** acetaminophen, ipratropium-albuterol, ondansetron **OR** ondansetron (ZOFRAN) IV, oxyCODONE-acetaminophen    Patient Profile   35 y.o. with history of cardiomyopathy and obesity.  He has been known to have a cardiomyopathy (biventricular failure) since back in 2019, echo in 2019 with EF 20% [facility]) . ESRD, OSA, OHS, and PAF.   Admitted with joint pain.   Assessment/Plan  1. Chronic Biventricular Heart Failure:  Known NICM since 2019, previously followed by [facility].  Echo 8/21 EF < 20%, moderate LV dilation, severely decreased RV function, severe biatrial enlargement, mild MR. No history of ETOH/drugs.  No FH of cardiomyopathy (mother with [place]).  He  has biventricular failure, nonischemic dilated cardiomyopathy, ?related to prior myocarditis.  He is too large for cardiac MRI. RHC/LHC on 03/04/20 with no CAD low CI at 2.1. Evaluated by EP but not a candidate for ICD given BMI.  Echo in 7/22 with EF < 20%, RV poorly visualized.  Developed cardiogenic shock 7/22 admission requiring pressors/inotropes and had AKI progressing to ESRD.  He titrated off inotropes/pressors.  Now on midodrine 20 mg tid with soft but stable BP.   Volume status managed with iHD.  Main issues driving admission seem to be malfunction of his home oxygen (not getting enough O2) and joint pain (?gout).   - Continue HD for volume control, renal has seen and plans to keep him on his TTS schedule.  - Continue current midodrine.  - Unable to place on GDMT due to ESRD/hypotension.  2. Atrial fibrillation: Paroxysmal.  DCCV several times in 8/22 admission.    - Remains in NSR.   - Continue amiodarone 200 daily.  - Continue Eliquis.  3. ESRD: So far, tolerating HD and getting fluid off effectively.    4. OHS/OSA: Using home oxygen + Bipap.  Home oxygen concentrator apparently was not working and he was not getting adequate oxygen at home, leading to dyspnea.   Currently oxygen saturation in mid-90s on 4L .  - Continue supplemental oxygen.  - Will need home health to set up oxygen at home. Consult TOC.  5. Super Morbid Obesity: He has started on semaglutide for weight loss.  6. Joint pain: High suspicion for gout flare.  - Uric Acid 9.5  - On prednisone.  7. Hyponatremia   Consult TOC for home oxygen. Consult PT. OOB today. .    Length of Stay: 0  Darrick Grinder, NP  04/14/2021, 7:26 AM  Advanced Heart Failure Team Pager [phone] (M-F; 7a - 5p)  Please contact [facility] for night-coverage after hours (5p -7a ) and weekends on [email or web address]  Patient seen with NP, agree with the above note.   Uric acid 9.5, suspect gout flare.  Still with pain though prednisone is helping.   Able to stand up yesterday.  Would give a 1 time dose of colchicine 0.3 mg.  Long-term, will need allopurinol.   Stable now on supplemental oxygen, needs at home.  To have HD today.   Hopefully home after HD.   [name] 04/14/2021 7:57 AM

## 2021-04-14 NOTE — Progress Notes (Signed)
Pt stated he could place himself on cpap when ready for bed.

## 2021-04-14 NOTE — Plan of Care (Signed)
  Problem: Education: Goal: Knowledge of General Education information will improve Description: Including pain rating scale, medication(s)/side effects and non-pharmacologic comfort measures Outcome: Progressing   Problem: Health Behavior/Discharge Planning: Goal: Ability to manage health-related needs will improve Outcome: Progressing   Problem: Clinical Measurements: Goal: Ability to maintain clinical measurements within normal limits will improve Outcome: Progressing Goal: Will remain free from infection Outcome: Progressing Goal: Diagnostic test results will improve Outcome: Progressing Goal: Respiratory complications will improve Outcome: Progressing Goal: Cardiovascular complication will be avoided Outcome: Progressing   Problem: Activity: Goal: Risk for activity intolerance will decrease Outcome: Progressing   Problem: Nutrition: Goal: Adequate nutrition will be maintained Outcome: Progressing   Problem: Coping: Goal: Level of anxiety will decrease Outcome: Progressing   Problem: Elimination: Goal: Will not experience complications related to bowel motility Outcome: Progressing Goal: Will not experience complications related to urinary retention Outcome: Progressing   Problem: Pain Managment: Goal: General experience of comfort will improve Outcome: Progressing   Problem: Safety: Goal: Ability to remain free from injury will improve Outcome: Progressing   Problem: Skin Integrity: Goal: Risk for impaired skin integrity will decrease Outcome: Progressing   Problem: Education: Goal: Ability to demonstrate management of disease process will improve Outcome: Progressing Goal: Ability to verbalize understanding of medication therapies will improve Outcome: Progressing Goal: Individualized Educational Video(s) Outcome: Progressing   Problem: Activity: Goal: Capacity to carry out activities will improve Outcome: Progressing   Problem: Cardiac: Goal:  Ability to achieve and maintain adequate cardiopulmonary perfusion will improve Outcome: Progressing   Problem: Education: Goal: Knowledge of disease and its progression will improve Outcome: Progressing   Problem: Health Behavior/Discharge Planning: Goal: Ability to manage health-related needs will improve Outcome: Progressing   Problem: Clinical Measurements: Goal: Complications related to the disease process or treatment will be avoided or minimized Outcome: Progressing Goal: Dialysis access will remain free of complications Outcome: Progressing   Problem: Activity: Goal: Activity intolerance will improve Outcome: Progressing   Problem: Fluid Volume: Goal: Fluid volume balance will be maintained or improved Outcome: Progressing   Problem: Nutritional: Goal: Ability to make appropriate dietary choices will improve Outcome: Progressing   Problem: Respiratory: Goal: Respiratory symptoms related to disease process will be avoided Outcome: Progressing   Problem: Self-Concept: Goal: Body image disturbance will be avoided or minimized Outcome: Progressing   Problem: Urinary Elimination: Goal: Progression of disease will be identified and treated Outcome: Progressing

## 2021-04-14 NOTE — Plan of Care (Signed)
Patient still complains of pain, with encouragement patient was able to stand at bedside and walk to sink to perform brushing his teeth.  Problem: Education: Goal: Knowledge of General Education information will improve Description: Including pain rating scale, medication(s)/side effects and non-pharmacologic comfort measures Outcome: Progressing   Problem: Health Behavior/Discharge Planning: Goal: Ability to manage health-related needs will improve Outcome: Progressing   Problem: Clinical Measurements: Goal: Ability to maintain clinical measurements within normal limits will improve Outcome: Progressing Goal: Will remain free from infection Outcome: Progressing Goal: Diagnostic test results will improve Outcome: Progressing Goal: Respiratory complications will improve Outcome: Progressing Goal: Cardiovascular complication will be avoided Outcome: Progressing   Problem: Activity: Goal: Risk for activity intolerance will decrease Outcome: Progressing   Problem: Nutrition: Goal: Adequate nutrition will be maintained Outcome: Progressing   Problem: Coping: Goal: Level of anxiety will decrease Outcome: Progressing   Problem: Elimination: Goal: Will not experience complications related to bowel motility Outcome: Progressing Goal: Will not experience complications related to urinary retention Outcome: Progressing   Problem: Pain Managment: Goal: General experience of comfort will improve Outcome: Not Progressing   Problem: Safety: Goal: Ability to remain free from injury will improve Outcome: Progressing   Problem: Education: Goal: Ability to demonstrate management of disease process will improve Outcome: Progressing Goal: Ability to verbalize understanding of medication therapies will improve Outcome: Progressing Goal: Individualized Educational Video(s) Outcome: Progressing

## 2021-04-14 NOTE — TOC Initial Note (Signed)
Transition of Care [facility]) - Initial/Assessment Note    Patient Details  Name: Phillip Young MRN: [id number] Date of Birth: 1985/11/11  [facility] of [facility]) CM/SW Contact:    Erenest Rasher, RN Phone Number: [phone] 04/14/2021, 12:36 PM  Clinical Narrative:                 HF TOC CM spoke to pt at bedside. States his concentrator is not "blowing concentrated oxygen". CM discussed he has to contact [place] in the future when oxygen is not working. States he does not have refillable portable oxygen tanks. Will follow up with [place]. [place]. States they will have oxygen serviced prior to dc. Provided [employer] with pt's mother contact number. Mother or Barbaraann Rondo will be at home. Requested portable oxygen tank to room for dc. Pt states he is wearing his Trilogy at night.   Expected Discharge Plan: Home/Self Care Barriers to Discharge: Continued Medical Work up   Patient Goals and CMS Choice        Expected Discharge Plan and Services Expected Discharge Plan: Home/Self Care In-house Referral: Clinical Social Work Discharge Planning Services: CM Consult Post Acute Care Choice: Durable Medical Equipment Living arrangements for the past 2 months: Single Family Home                   DME Agency: [employer] Date DME Agency Contacted: 04/14/21 Time DME Agency Contacted: 29 Representative spoke with at DME Agency: Andree Coss            Prior Living Arrangements/Services Living arrangements for the past 2 months: [place] Lives with:: Relatives Patient language and need for interpreter reviewed:: Yes Do you feel safe going back to the place where you live?: Yes      Need for Family Participation in Patient Care: No (Comment) Care giver support system in place?: No (comment) Current home services: DME (Trilogy ([employer]), oxygen ([employer])) Criminal Activity/Legal Involvement Pertinent to Current  Situation/Hospitalization: No - Comment as needed  Activities of Daily Living Home Assistive Devices/Equipment: Oxygen ADL Screening (condition at time of admission) Patient's cognitive ability adequate to safely complete daily activities?: No Is the patient deaf or have difficulty hearing?: No Does the patient have difficulty seeing, even when wearing glasses/contacts?: No Does the patient have difficulty concentrating, remembering, or making decisions?: No Patient able to express need for assistance with ADLs?: Yes Does the patient have difficulty dressing or bathing?: Yes Independently performs ADLs?: Yes (appropriate for developmental age) Does the patient have difficulty walking or climbing stairs?: Yes Weakness of Legs: Both Weakness of Arms/Hands: Both  Permission Sought/Granted Permission sought to share information with : Case Manager, Family Supports, PCP Permission granted to share information with : Yes, Verbal Permission Granted  Share Information with NAME: Dashton Caldarelli     Permission granted to share info w Relationship: mother  Permission granted to share info w Contact Information: [phone]  Emotional Assessment Appearance:: Appears stated age Attitude/Demeanor/Rapport: Gracious, Engaged Affect (typically observed): Accepting Orientation: : Oriented to Self, Oriented to Place, Oriented to  Time, Oriented to Situation   Psych Involvement: No (comment)  Admission diagnosis:  Morbid obesity ([place]) [E66.01] Pleural effusion [J90] ESRD on dialysis ([place]) [N18.6, Z99.2] Acute respiratory failure with hypoxia ([name]) [J96.01] Pulmonary vascular congestion [R09.89] Acute on chronic systolic CHF (congestive heart failure) ([name]) [I50.23] Acute on chronic congestive heart failure, unspecified heart failure type [facility]) [I50.9] Patient Active Problem List  Diagnosis Date Noted   ESRD (end stage renal disease) ([place]) 04/12/2021   Paroxysmal atrial flutter ([place])  04/12/2021   Acute respiratory failure with hypoxia ([place]) 04/12/2021   Hyponatremia 04/12/2021   Type 2 diabetes mellitus with diabetic chronic kidney disease ([place]) 03/26/2021   Dyspnea    Cardiogenic shock ([name])    Atrial flutter with rapid ventricular response ([place]) 02/11/2021   Elevated troponin 02/11/2021   Chronic respiratory failure with hypoxia ([place]) 02/11/2021   CAP (community acquired pneumonia) [id number]   Chronic systolic CHF (congestive heart failure) ([name])    Hypokalemia    CHF (congestive heart failure) ([place])    Chronic cough    Acute on chronic systolic (congestive) heart failure ([place]) 02/26/2020   Morbid obesity with body mass index (BMI) of 60.0 to 69.9 in adult ([place]) 02/26/2020   Essential hypertension 02/26/2020   GERD without esophagitis 02/26/2020   Diuretic-induced hypokalemia 02/26/2020   OSA (obstructive sleep apnea) 02/26/2020   Hidradenitis suppurativa 02/26/2020   Prediabetes 02/26/2020   PCP:  [name], MD Pharmacy:   [facility] [phone] - [place], [place] - [address] AT [address] [place] [address] Phone: [phone] Fax: [phone]  [facility] [address] [address] Phone: [phone] Fax: [phone]     Social Determinants of Health (SDOH) Interventions Food Insecurity Interventions: Assist with SNAP Application Financial Strain Interventions: Other (Comment) (Referral to [facility]) Housing Interventions: Intervention Not Indicated Transportation Interventions: Intervention Not Indicated  Readmission Risk Interventions Readmission Risk Prevention Plan 10/16/2020 10/16/2020 10/14/2020  Transportation Screening Complete Complete Complete  PCP or Specialist Appt within 5-7 Days Complete Complete Complete  Home Care Screening Complete - -  Medication Review (RN CM) Referral to Pharmacy - -  Some recent data might be  hidden

## 2021-04-14 NOTE — Progress Notes (Signed)
   04/14/21 1340  Vitals  Temp 98.2 F (36.8 C)  Temp Source Oral  BP (!) 130/57  BP Location Right Wrist  BP Method Automatic  Patient Position (if appropriate) Lying  Pulse Rate 76  Pulse Rate Source Monitor  ECG Heart Rate 76  Resp (!) 24  Oxygen Therapy  SpO2 98 %  O2 Device Nasal Cannula  O2 Flow Rate (L/min) 4 L/min  Dialysis Weight  Weight  (unable to obtain due to malfunctioning bed)  Post-Hemodialysis Assessment  Rinseback Volume (mL) 250 mL  KECN 281 V  Dialyzer Clearance Lightly streaked  Duration of HD Treatment -hour(s) 4 hour(s)  Hemodialysis Intake (mL) 500 mL  UF Total -Machine (mL) 4204 mL  Net UF (mL) 3704 mL  Tolerated HD Treatment Yes  Post-Hemodialysis Comments tx complete-pt stable  Hemodialysis Catheter Right Internal jugular Double lumen Permanent (Tunneled)  Placement Date/Time: 03/10/21 1441   Time Out: Correct patient;Correct site;Correct procedure  Maximum sterile barrier precautions: Hand hygiene;Sterile gloves;Cap;Large sterile sheet;Mask;Sterile gown  Site Prep: Chlorhexidine (preferred)  Local Anes...  Blue Lumen Status Flushed  Red Lumen Status Flushed  Catheter fill solution 4% Sodium Citrate  Catheter fill volume (Arterial) 1.9 cc  Catheter fill volume (Venous) 1.9  Dressing Type Transparent  Dressing Status Clean;Dry;Intact  Antimicrobial disc in place? Yes  Interventions New dressing;Dressing changed;Antimicrobial disc changed  Dressing Change Due 04/21/21  Post treatment catheter status Capped and Clamped  HD tx complete, pt stable.

## 2021-04-14 NOTE — Progress Notes (Signed)
Occupational Therapy Treatment Patient Details Name: Phillip Young MRN: [id number] DOB: [birth year] Today's Date: 04/14/2021   History of present illness 35 y.o. male admitted 9/18 with SOB, sats at 78% and reporting home O2 concentrator not working properly. Pt with recent hospitalization 7/20-9/1 with cardiogenic shock, cardiorenal syndrome which unfortunately progressed to ESRD with need for HD, biventricular heart failure. PMH: CHF, DM 2, a flutter, ESRD on HD TTS.   OT comments  Pt continues to report limitations due to R knee pain (L wrist pain appeared improved during session). Pt able to demo mobility in room without AD though appeared more comfortable with RW trial to offload R LE. Pt able to stand and demo ADLs without LOB or physical assist. Pt is insistent to have male staff assist with initial sit to stand transfers due to being a "heavy man" and this being a "man's job". However, once up, pt able to move around fairly well. Anticipate once pain further improves, pt will be able to return home with HHOT follow-up. Plan to educate in AE and further problem solve LB ADL completion.   SpO2 WFL on 4 L O2.   Recommendations for follow up therapy are one component of a multi-disciplinary discharge planning process, led by the attending physician.  Recommendations may be updated based on patient status, additional functional criteria and insurance authorization.    Follow Up Recommendations  Home health OT;Supervision - Intermittent    Equipment Recommendations  Other (comment) (bariatric rolling walker)    Recommendations for Other Services      Precautions / Restrictions Precautions Precautions: Other (comment) Precaution Comments: monitor O2 (wears 1 L O2 at home) Restrictions Weight Bearing Restrictions: No       Mobility Bed Mobility Overal bed mobility: Needs Assistance Bed Mobility: Supine to Sit     Supine to sit: Mod assist;HOB elevated     General bed mobility  comments: Light assist to advance R LE to EOB, increased assist to pull up trunk    Transfers Overall transfer level: Needs assistance Equipment used: 1 person hand held assist Transfers: Sit to/from Stand Sit to Stand: From elevated surface         General transfer comment: Pt insistent to have only male staff assist with task. From elevated bed and locking L UE with male NT, pt able to pull self up while NT static standing    Balance Overall balance assessment: Needs assistance Sitting-balance support: No upper extremity supported;Feet supported Sitting balance-Leahy Scale: Good     Standing balance support: No upper extremity supported;During functional activity Standing balance-Leahy Scale: Good Standing balance comment: able to mobilize without AD though improved comfort with RW to offload R LE                           ADL either performed or assessed with clinical judgement   ADL Overall ADL's : Needs assistance/impaired     Grooming: Set up;Standing;Brushing hair Grooming Details (indicate cue type and reason): able to complete task without AD standing at sink Upper Body Bathing: Minimal assistance;Sitting Upper Body Bathing Details (indicate cue type and reason): RN assisting in washing and applying lotion due to skin irritation                         Functional mobility during ADLs: Supervision/safety;Rolling walker General ADL Comments: Pt continues to self limit in bed mobility and standing attempts (insistent on having  male staff member assist due to him being a heavy man and this being a "man's job"). Once up (pulling on locked arms with NT Annie Main), pt able to mobilize without AD though improved comfort noted with RW use     Vision   Vision Assessment?: No apparent visual deficits   Perception     Praxis      Cognition Arousal/Alertness: Awake/alert Behavior During Therapy: WFL for tasks assessed/performed Overall Cognitive  Status: Within Functional Limits for tasks assessed                                 General Comments: cognitively [facility], inconsistent reports of pain, functioning, etc than what was presented        Exercises     Shoulder Instructions       General Comments VSS on 4 L O2. Encouraged continued gentle ROM within tolerance to affected joints    Pertinent Vitals/ Pain       Pain Assessment: 0-10 Pain Score: 10-Worst pain ever (facial expression 5/10) Pain Location: R knee Pain Descriptors / Indicators: Grimacing;Guarding;Moaning Pain Intervention(s): Monitored during session;Patient requesting pain meds-RN notified;RN gave pain meds during session;Limited activity within patient's tolerance  Home Living                                          Prior Functioning/Environment              Frequency  Min 2X/week        Progress Toward Goals  OT Goals(current goals can now be found in the care plan section)  Progress towards OT goals: Progressing toward goals  Acute Rehab OT Goals Patient Stated Goal: decrease pain OT Goal Formulation: With patient Time For Goal Achievement: 04/27/21 Potential to Achieve Goals: Fair ADL Goals Pt Will Perform Grooming: with set-up;standing Pt Will Perform Lower Body Bathing: with min guard assist;sit to/from stand Pt Will Transfer to Toilet: with min guard assist;stand pivot transfer;bedside commode Pt/caregiver will Perform Home Exercise Program: Increased ROM;Left upper extremity;Independently;With written HEP provided  Plan Discharge plan needs to be updated    Co-evaluation                 AM-PAC OT "6 Clicks" Daily Activity     Outcome Measure   Help from another person eating meals?: A Little Help from another person taking care of personal grooming?: A Little Help from another person toileting, which includes using toliet, bedpan, or urinal?: A Lot Help from another person bathing  (including washing, rinsing, drying)?: A Lot Help from another person to put on and taking off regular upper body clothing?: A Little Help from another person to put on and taking off regular lower body clothing?: A Lot 6 Click Score: 15    End of Session Equipment Utilized During Treatment: Rolling walker;Oxygen  OT Visit Diagnosis: Unsteadiness on feet (R26.81);Other abnormalities of gait and mobility (R26.89);Muscle weakness (generalized) (M62.81);Pain Pain - Right/Left: Right Pain - part of body: Knee   Activity Tolerance Patient limited by pain   Patient Left in bed;with call bell/phone within reach   Nurse Communication Mobility status        Time: [id number] OT Time Calculation (min): 33 min  Charges: OT General Charges $OT Visit: 1 Visit OT Treatments $Self Care/Home Management : 8-22 mins $Therapeutic  Activity: 8-22 mins  Malachy Chamber, OTR/L Acute Rehab Services Office: [phone]   Layla Maw 04/14/2021, 9:44 AM

## 2021-04-14 NOTE — Progress Notes (Signed)
[facility] Progress Note  Subjective: seen on HD, started on prednisone for possible gout. Has some dried drainage around his Phillip Young [facility] exit site, he says that this is a chronic issue and that they will clean this up around the exit site when he needs it at his OP unit.   Vitals:   04/14/21 0717 04/14/21 0835 04/14/21 0920 04/14/21 0935  BP: 99/69  (!) 103/45 (!) 105/4  Pulse: 67  68 67  Resp: 19 20 (!) 25 (!) 25  Temp: 97.8 F (36.6 C)  (!) 97.4 F (36.3 C)   TempSrc: Oral  Oral   SpO2: 94%  98% 99%  Weight:      Height:        Exam:  alert, nad , obese, nasal O2  Lying flat, no ^wob  no jvd  Chest cta bilat  Cor reg no RG  Abd soft ntnd no ascites   Ext no LE edema   Alert, NF O x3  RIJ TDC      Home meds include - eliquis 5 bid, amiodarone, lanthanum 500 mg ac tid, midodrine '20mg'$  tid      OP HD: TTS/ AKI [facility]    4h  400/800    188.5kg   2/2 bath  TDC RIJ  Hep none   - no in center meds      Na 127  K 3.9  CO2 24  BUN 54  Cr 9.0  Ca 8.8   alb 3.3   Assessment/ Plan: SOB - likely that his O2 equipment ran out or malfunctioned. SOB resolved w/ O2 supplied here. Per pt has not missed any OP HD and they have been getting off the usual amounts (4-5 L) w/ each session. Minimal vol excess on exam. Plan HD today.  ESRD - on HD TTS. HD today. Is at dry wt but will plan 3-4 L UF as tolerated (serum Na is low possibly has lost wt) Bivent CHF - severe Hypotension - cont midodrine at high dose 20 tid Volume- as above  R knee/ L wrist pain - joints painful and tender, started on pred for suspected gout Atrial fib - on amio/ eliquis MBD ckd - cont binder Anemia ckd - Hb 9.9 here, stable       Phillip Young 04/14/2021, 9:51 AM   Recent Labs  Lab 04/12/21 1555 04/13/21 0432  K 4.0 3.9  BUN 50* 54*  CREATININE 8.28* 9.04*  CALCIUM 8.8* 8.8*  HGB 9.9* 8.5*    Inpatient medications:  allopurinol  100 mg Oral Daily   amiodarone  200 mg Oral Daily    apixaban  5 mg Oral BID   Chlorhexidine Gluconate Cloth  6 each Topical Daily   Chlorhexidine Gluconate Cloth  6 each Topical Q0600   colchicine  0.3 mg Oral Once   lanthanum  500 mg Oral TID WC   midodrine  20 mg Oral TID WC   predniSONE  40 mg Oral Daily   Semaglutide(0.25 or 0.'5MG'$ /DOS)  0.25 mg Subcutaneous Weekly    acetaminophen **OR** acetaminophen, cyclobenzaprine, ipratropium-albuterol, ondansetron **OR** ondansetron (ZOFRAN) IV, oxyCODONE-acetaminophen

## 2021-04-14 NOTE — Progress Notes (Signed)
Currently PROGRESS NOTE    Phillip Young  [id number] DOB: [birth year] DOA: 04/12/2021 PCP: [name], MD    Brief Narrative:  35 year old gentleman with severe biventricular systolic heart failure with known ejection fraction less than 20%, type 2 diabetes, paroxysmal A-flutter on Eliquis and restarted on hemodialysis and recent extensive hospitalization with cardiogenic shock and cardiorenal syndrome progressed to ESRD presents back to the emergency room with shortness of breath, left wrist and right knee hurting.  Unable to ambulate around.  Home oxygen concentrator not working. At the emergency room saturations improved and back on 4 L oxygen.  Chest x-ray with pulmonary edema mostly chronic.  COVID-19 and flu negative.  Unable to ambulate.   Assessment & Plan:   Principal Problem:   Chronic respiratory failure with hypoxia ([name]) Active Problems:   Morbid obesity with body mass index (BMI) of 60.0 to 69.9 in adult [facility])   Chronic systolic CHF (congestive heart failure) ([name])   Type 2 diabetes mellitus with diabetic chronic kidney disease ([name])   ESRD (end stage renal disease) ([name])   Paroxysmal atrial flutter ([name])   Hyponatremia  Chronic respiratory failure with hypoxemia and hypercarbia: -Due to malfunctioning concentrator at home.  Currently on 4 L oxygen and saturating adequate.  Keep on oxygen to keep saturation more than 90%.  Use BiPAP at night without interruption because of underlying obesity hypoventilation, sleep apnea and heart failure.  Stabilized.  Left wrist and right knee pain: Gout flare.   Prednisone trial.   1 dose of colchicine.   Started on allopurinol, uric acid is 9.6.   As needed pain medications.  Short course of opiates.  ESRD and hemodialysis: Reportedly stable.  Followed by nephrology.  For dialysis today.  On TTS schedule.  Biventricular heart failure/paroxysmal atrial fibrillation/hypotension: Stable.  Known EF of less than 20%.   Followed by heart failure team.  Intolerance to advanced heart failure therapies. Midodrine 20 mg t 3 times a day.  Fluid management with dialysis.  Not a candidate for ICD as per heart failure team. Currently in sinus rhythm on amiodarone.  Therapeutic on Eliquis.  Morbid obesity: BMI more than 50.  Started on semaglutide.  Ambulatory dysfunction: Work with PT OT.  Still painful.  Could not work with physical therapy today.  We will continue to follow.  Unable to go home until able to mobilize out of the bed.   DVT prophylaxis:  apixaban (ELIQUIS) tablet 5 mg   Code Status: Full code Family Communication: None Disposition Plan: Status is: Observation  The patient will require care spanning > 2 midnights and should be moved to inpatient because: IV treatments appropriate due to intensity of illness or inability to take PO and Inpatient level of care appropriate due to severity of illness  Dispo: The patient is from: Home              Anticipated d/c is to: SNF versus home with home health.              Patient currently is medically stable but unable to ambulate.   Difficult to place patient No         Consultants:  Cardiology Nephrology  Procedures:  None  Antimicrobials:  None   Subjective: Seen and examined.  Left wrist pain is better.  Knee pain is better but yet unable to mobilize and bear weight.  Objective: Vitals:   04/14/21 1130 04/14/21 1200 04/14/21 1230 04/14/21 1300  BP: (!) 82/42 112/60 126/60 Marland Kitchen)  90/34  Pulse: 75 (!) 142 72 78  Resp: (!) 22 (!) 32 (!) 29 (!) 23  Temp:      TempSrc:      SpO2: 93% 100% 93% 95%  Weight:      Height:        Intake/Output Summary (Last 24 hours) at 04/14/2021 1331 Last data filed at 04/14/2021 0835 Gross per 24 hour  Intake 480 ml  Output 450 ml  Net 30 ml   Filed Weights   04/12/21 1829 04/13/21 0043  Weight: (!) 189.4 kg (!) 188 kg    Examination:  General: Looks slightly anxious.  On 4 L oxygen.   Otherwise comfortable. Cardiovascular: Regular rate rhythm.  Difficult to auscultate. Respiratory: No added sounds.  Difficult to auscultate. Gastrointestinal: Soft and nontender.  Obese and pendulous.  Bowel sounds present. Ext: Chronic edematous legs.  Nonpitting edema. Left wrist joint with some stiffness and crepitus on movement, no joint effusion or swelling. Right knee with tenderness to palpation but no joint effusion or swelling.   Data Reviewed: I have personally reviewed following labs and imaging studies  CBC: Recent Labs  Lab 04/12/21 1555 04/13/21 0432 04/14/21 0831  WBC 11.9* 9.2 10.0  NEUTROABS 9.2*  --   --   HGB 9.9* 8.5* 8.9*  HCT 29.7* 26.4* 26.9*  MCV 96.4 97.1 98.2  PLT 221 213 [id number]   Basic Metabolic Panel: Recent Labs  Lab 04/12/21 1555 04/13/21 0432 04/14/21 0831  NA 127* 127* 129*  K 4.0 3.9 3.8  CL 86* 85* 84*  CO2 '22 24 23  '$ GLUCOSE 123* 113* 133*  BUN 50* 54* 75*  CREATININE 8.28* 9.04* 9.96*  CALCIUM 8.8* 8.8* 8.4*  PHOS  --   --  8.7*   GFR: Estimated Creatinine Clearance: 17.5 mL/min (A) (by C-G formula based on SCr of 9.96 mg/dL (H)). Liver Function Tests: Recent Labs  Lab 04/12/21 1555 04/14/21 0831  AST 36  --   ALT 32  --   ALKPHOS 84  --   BILITOT 1.2  --   PROT 8.5*  --   ALBUMIN 3.3* 3.0*   Recent Labs  Lab 04/12/21 1555  LIPASE 28   No results for input(s): AMMONIA in the last 168 hours. Coagulation Profile: No results for input(s): INR, PROTIME in the last 168 hours. Cardiac Enzymes: No results for input(s): CKTOTAL, CKMB, CKMBINDEX, TROPONINI in the last 168 hours. BNP (last 3 results) No results for input(s): PROBNP in the last 8760 hours. HbA1C: No results for input(s): HGBA1C in the last 72 hours. CBG: No results for input(s): GLUCAP in the last 168 hours. Lipid Profile: No results for input(s): CHOL, HDL, LDLCALC, TRIG, CHOLHDL, LDLDIRECT in the last 72 hours. Thyroid Function Tests: No results for  input(s): TSH, T4TOTAL, FREET4, T3FREE, THYROIDAB in the last 72 hours. Anemia Panel: No results for input(s): VITAMINB12, FOLATE, FERRITIN, TIBC, IRON, RETICCTPCT in the last 72 hours. Sepsis Labs: No results for input(s): PROCALCITON, LATICACIDVEN in the last 168 hours.  Recent Results (from the past 240 hour(s))  Resp Panel by RT-PCR (Flu A&B, Covid) Nasopharyngeal Swab     Status: None   Collection Time: 04/12/21  8:51 PM   Specimen: Nasopharyngeal Swab; Nasopharyngeal(NP) swabs in vial transport medium  Result Value Ref Range Status   SARS Coronavirus 2 by RT PCR NEGATIVE NEGATIVE Final    Comment: (NOTE) SARS-CoV-2 target nucleic acids are NOT DETECTED.  The SARS-CoV-2 RNA is generally detectable in upper respiratory  specimens during the acute phase of infection. The lowest concentration of SARS-CoV-2 viral copies this assay can detect is 138 copies/mL. A negative result does not preclude SARS-Cov-2 infection and should not be used as the sole basis for treatment or other patient management decisions. A negative result may occur with  improper specimen collection/handling, submission of specimen other than nasopharyngeal swab, presence of viral mutation(s) within the areas targeted by this assay, and inadequate number of viral copies(<138 copies/mL). A negative result must be combined with clinical observations, patient history, and epidemiological information. The expected result is Negative.  Fact Sheet for Patients:  EntrepreneurPulse.com.au  Fact Sheet for Healthcare Providers:  IncredibleEmployment.be  This test is no t yet approved or cleared by the [place] FDA and  has been authorized for detection and/or diagnosis of SARS-CoV-2 by FDA under an Emergency Use Authorization (EUA). This EUA will remain  in effect (meaning this test can be used) for the duration of the COVID-19 declaration under Section 564(b)(1) of the Act,  21 U.S.C.section 360bbb-3(b)(1), unless the authorization is terminated  or revoked sooner.       Influenza A by PCR NEGATIVE NEGATIVE Final   Influenza B by PCR NEGATIVE NEGATIVE Final    Comment: (NOTE) The Xpert Xpress SARS-CoV-2/FLU/RSV plus assay is intended as an aid in the diagnosis of influenza from Nasopharyngeal swab specimens and should not be used as a sole basis for treatment. Nasal washings and aspirates are unacceptable for Xpert Xpress SARS-CoV-2/FLU/RSV testing.  Fact Sheet for Patients: EntrepreneurPulse.com.au  Fact Sheet for Healthcare Providers: IncredibleEmployment.be  This test is not yet approved or cleared by the [place] FDA and has been authorized for detection and/or diagnosis of SARS-CoV-2 by FDA under an Emergency Use Authorization (EUA). This EUA will remain in effect (meaning this test can be used) for the duration of the COVID-19 declaration under Section 564(b)(1) of the Act, 21 U.S.C. section 360bbb-3(b)(1), unless the authorization is terminated or revoked.  Performed at [facility], [place] [address] [place], [place] [address]   MRSA Next Gen by PCR, Nasal     Status: None   Collection Time: 04/13/21  1:02 AM   Specimen: Nasal Mucosa; Nasal Swab  Result Value Ref Range Status   MRSA by PCR Next Gen NOT DETECTED NOT DETECTED Final    Comment: (NOTE) The GeneXpert MRSA Assay (FDA approved for NASAL specimens only), is one component of a comprehensive MRSA colonization surveillance program. It is not intended to diagnose MRSA infection nor to guide or monitor treatment for MRSA infections. Test performance is not FDA approved in patients less than 62 years old. Performed at [facility], [place] [address] [place], [place] [address]          Radiology Studies: DG Chest 2 View  Result Date: 04/12/2021 CLINICAL DATA:  Acute shortness of breath. EXAM: CHEST - 2 VIEW COMPARISON:   03/11/2021 and prior studies FINDINGS: Cardiomegaly and RIGHT IJ central venous catheter again noted. Pulmonary vascular congestion and mild bilateral interstitial opacities are noted. No pleural effusion or pneumothorax noted. No acute bony abnormalities are present. IMPRESSION: Cardiomegaly with pulmonary vascular congestion and mild bilateral interstitial opacities/edema. Electronically Signed   By: Margarette Canada M.D.   On: 04/12/2021 16:45   DG Knee Complete 4 Views Right  Result Date: 04/12/2021 CLINICAL DATA:  Knee pain.  Shortness of breath. EXAM: RIGHT KNEE - COMPLETE 4+ VIEW COMPARISON:  None. FINDINGS: No evidence of fracture, dislocation, or joint  effusion. No evidence of arthropathy or other focal bone abnormality. Soft tissues are unremarkable. IMPRESSION: Negative. Electronically Signed   By: Dorise Bullion III M.D.   On: 04/12/2021 16:44        Scheduled Meds:  allopurinol  100 mg Oral Daily   amiodarone  200 mg Oral Daily   apixaban  5 mg Oral BID   Chlorhexidine Gluconate Cloth  6 each Topical Daily   Chlorhexidine Gluconate Cloth  6 each Topical Q0600   colchicine  0.3 mg Oral Once   lanthanum  500 mg Oral TID WC   midodrine  20 mg Oral TID WC   predniSONE  40 mg Oral Daily   Semaglutide(0.25 or 0.'5MG'$ /DOS)  0.25 mg Subcutaneous Weekly   Continuous Infusions:  anticoagulant sodium citrate       LOS: 0 days    Time spent: 30 minutes    Barb Merino, MD [facility] Pager [phone]

## 2021-04-14 NOTE — Progress Notes (Signed)
PT Cancellation Note  Patient Details Name: Kasai Browder MRN: [id number] DOB: 1986-02-11   Cancelled Treatment:    Reason Eval/Treat Not Completed: Patient at procedure or test/unavailable; seated EOB taking meds and transport to dialysis waiting to take him.  Already worked with OT some this am.  Will attempt again another day.   Reginia Naas 04/14/2021, 9:12 AM Magda Kiel, PT Acute Rehabilitation Services [id number] Office:[phone] 04/14/2021

## 2021-04-15 DIAGNOSIS — J9610 Chronic respiratory failure, unspecified whether with hypoxia or hypercapnia: Secondary | ICD-10-CM | POA: Diagnosis not present

## 2021-04-15 DIAGNOSIS — J9611 Chronic respiratory failure with hypoxia: Secondary | ICD-10-CM | POA: Diagnosis not present

## 2021-04-15 DIAGNOSIS — J9621 Acute and chronic respiratory failure with hypoxia: Secondary | ICD-10-CM | POA: Diagnosis not present

## 2021-04-15 DIAGNOSIS — I509 Heart failure, unspecified: Secondary | ICD-10-CM | POA: Diagnosis not present

## 2021-04-15 DIAGNOSIS — I5023 Acute on chronic systolic (congestive) heart failure: Secondary | ICD-10-CM | POA: Diagnosis not present

## 2021-04-15 LAB — HEPATITIS B SURFACE ANTIBODY, QUANTITATIVE: Hep B S AB Quant (Post): 99 m[IU]/mL (ref >9.9)

## 2021-04-15 NOTE — Progress Notes (Signed)
[facility] Progress Note  Subjective: seen in room. 3.7 L off w/ HD yest, no c/o's today.   Vitals:   04/15/21 0052 04/15/21 0341 04/15/21 0728 04/15/21 0820  BP:  113/74 (!) 103/49 124/69  Pulse: 73 68 71 69  Resp:  20 20 (!) 21  Temp:  97.8 F (36.6 C) 97.6 F (36.4 C)   TempSrc:  Oral Oral   SpO2: 98% 98% 98% 94%  Weight:      Height:        Exam:  alert, nad , obese, nasal O2  Lying flat, no ^wob  no jvd  Chest cta bilat  Cor reg no RG  Abd soft ntnd no ascites   Ext no LE edema   Alert, NF O x3  RIJ TDC      Home meds include - eliquis 5 bid, amiodarone, lanthanum 500 mg ac tid, midodrine '20mg'$  tid      OP HD: TTS/ AKI [facility]    4h  400/800    188.5kg   2/2 bath  TDC RIJ  Hep none   - no in center meds     Assessment/ Plan: SOB - likely that his O2 equipment ran out or malfunctioned. SOB resolved w/ O2 supplied here. Did not look overloaded on admission. 3.7 L off w/ HD yest, stable today. At dry wt on admission.  ESRD - on HD TTS. HD tomorrow.  Bivent CHF - severe Hypotension - cont midodrine at high dose 20 tid Volume- as above  R knee/ L wrist pain - joints painful and tender, started on prednisone for suspected gout Atrial fib - on amio/ eliquis MBD ckd - cont binder Anemia ckd - Hb 9.9 here, stable       Phillip Young 04/15/2021, 10:14 AM   Recent Labs  Lab 04/13/21 0432 04/14/21 0831  K 3.9 3.8  BUN 54* 75*  CREATININE 9.04* 9.96*  CALCIUM 8.8* 8.4*  PHOS  --  8.7*  HGB 8.5* 8.9*    Inpatient medications:  allopurinol  100 mg Oral Daily   amiodarone  200 mg Oral Daily   apixaban  5 mg Oral BID   Chlorhexidine Gluconate Cloth  6 each Topical Daily   Chlorhexidine Gluconate Cloth  6 each Topical Q0600   lanthanum  500 mg Oral TID WC   midodrine  20 mg Oral TID WC   predniSONE  40 mg Oral Daily    anticoagulant sodium citrate     acetaminophen **OR** acetaminophen, anticoagulant sodium citrate,  cyclobenzaprine, ipratropium-albuterol, ondansetron **OR** ondansetron (ZOFRAN) IV, oxyCODONE-acetaminophen

## 2021-04-15 NOTE — Progress Notes (Addendum)
Advanced Heart Failure Rounding Note  PCP-Cardiologist: None   Subjective:    Had iHD yesterday. 3.7L fluid removal. No dyspnea.   BP stable.   Na 129   NSR, HR 70s.   Continues w/ gout pain in knee, hip and wrist. Difficulties bearing wt.   Objective:   Weight Range: (!) 188 kg Body mass index is 57.81 kg/m.   Vital Signs:   Temp:  [97.4 F (36.3 C)-98.3 F (36.8 C)] 97.6 F (36.4 C) (09/21 0728) Pulse Rate:  [67-142] 69 (09/21 0820) Resp:  [17-32] 21 (09/21 0820) BP: (82-130)/(33-78) 124/69 (09/21 0820) SpO2:  [89 %-100 %] 94 % (09/21 0820) Last BM Date: 04/13/21  Weight change: Filed Weights   04/12/21 1829 04/13/21 0043  Weight: (!) 189.4 kg (!) 188 kg    Intake/Output:   Intake/Output Summary (Last 24 hours) at 04/15/2021 0833 Last data filed at 04/15/2021 0342 Gross per 24 hour  Intake 720 ml  Output 3904 ml  Net -3184 ml      Physical Exam    PHYSICAL EXAM: General:  morbidly obese male. No respiratory difficulty HEENT: normal Neck: supple. Thick neck, JVD not well visualized Carotids 2+ bilat; no bruits. No lymphadenopathy or thyromegaly appreciated. Cor: PMI nondisplaced. Regular rate & rhythm. No rubs, gallops or murmurs. Lungs: clear Abdomen: obese, soft, nontender, nondistended. No hepatosplenomegaly. No bruits or masses. Good bowel sounds. Extremities: no cyanosis, clubbing, rash, edema Neuro: alert & oriented x 3, cranial nerves grossly intact. moves all 4 extremities w/o difficulty. Affect pleasant.   Telemetry   SR 70s personally reviewed  Labs    CBC Recent Labs    04/12/21 1555 04/13/21 0432 04/14/21 0831  WBC 11.9* 9.2 10.0  NEUTROABS 9.2*  --   --   HGB 9.9* 8.5* 8.9*  HCT 29.7* 26.4* 26.9*  MCV 96.4 97.1 98.2  PLT 221 213 [id number]   Basic Metabolic Panel Recent Labs    04/13/21 0432 04/14/21 0831  NA 127* 129*  K 3.9 3.8  CL 85* 84*  CO2 24 23  GLUCOSE 113* 133*  BUN 54* 75*  CREATININE 9.04* 9.96*   CALCIUM 8.8* 8.4*  PHOS  --  8.7*   Liver Function Tests Recent Labs    04/12/21 1555 04/14/21 0831  AST 36  --   ALT 32  --   ALKPHOS 84  --   BILITOT 1.2  --   PROT 8.5*  --   ALBUMIN 3.3* 3.0*   Recent Labs    04/12/21 1555  LIPASE 28   Cardiac Enzymes No results for input(s): CKTOTAL, CKMB, CKMBINDEX, TROPONINI in the last 72 hours.  BNP: BNP (last 3 results) Recent Labs    11/07/20 0932 02/11/21 1500 04/12/21 1555  BNP 106.0* 194.3* 658.4*    ProBNP (last 3 results) No results for input(s): PROBNP in the last 8760 hours.   D-Dimer Recent Labs    04/12/21 1555  DDIMER 1.93*   Hemoglobin A1C No results for input(s): HGBA1C in the last 72 hours. Fasting Lipid Panel No results for input(s): CHOL, HDL, LDLCALC, TRIG, CHOLHDL, LDLDIRECT in the last 72 hours. Thyroid Function Tests No results for input(s): TSH, T4TOTAL, T3FREE, THYROIDAB in the last 72 hours.  Invalid input(s): FREET3  Other results:   Imaging    No results found.   Medications:     Scheduled Medications:  allopurinol  100 mg Oral Daily   amiodarone  200 mg Oral Daily   apixaban  5 mg Oral BID   Chlorhexidine Gluconate Cloth  6 each Topical Daily   Chlorhexidine Gluconate Cloth  6 each Topical Q0600   lanthanum  500 mg Oral TID WC   midodrine  20 mg Oral TID WC   predniSONE  40 mg Oral Daily    Infusions:  anticoagulant sodium citrate      PRN Medications: acetaminophen **OR** acetaminophen, anticoagulant sodium citrate, cyclobenzaprine, ipratropium-albuterol, ondansetron **OR** ondansetron (ZOFRAN) IV, oxyCODONE-acetaminophen    Patient Profile   35 y.o. with history of cardiomyopathy and obesity.  He has been known to have a cardiomyopathy (biventricular failure) since back in 2019, echo in 2019 with EF 20% [facility]) . ESRD, OSA, OHS, and PAF.   Admitted with joint pain.   Assessment/Plan  1. Chronic Biventricular Heart Failure:  Known NICM since 2019,  previously followed by [facility].  Echo 8/21 EF < 20%, moderate LV dilation, severely decreased RV function, severe biatrial enlargement, mild MR. No history of ETOH/drugs.  No FH of cardiomyopathy (mother with Center).  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  He is too large for cardiac MRI. RHC/LHC on 03/04/20 with no CAD low CI at 2.1. Evaluated by EP but not a candidate for ICD given BMI.  Echo in 7/22 with EF < 20%, RV poorly visualized.  Developed cardiogenic shock 7/22 admission requiring pressors/inotropes and had AKI progressing to ESRD.  He titrated off inotropes/pressors.  Now on midodrine 20 mg tid with soft but stable BP.   Volume status managed with iHD.  Main issues driving admission seem to be malfunction of his home oxygen (not getting enough O2) and joint pain (?gout).   - Continue HD for volume control, renal has seen and plans to keep him on his TTS schedule.  - Continue current midodrine.  - Unable to place on GDMT due to ESRD/hypotension.  2. Atrial fibrillation: Paroxysmal.  DCCV several times in 8/22 admission.    - Remains in NSR.   - Continue amiodarone 200 daily.  - Continue Eliquis 5 mg bid  3. ESRD: So far, tolerating HD and getting fluid off effectively.    4. OHS/OSA: Using home oxygen + Bipap.  Home oxygen concentrator apparently was not working and he was not getting adequate oxygen at home, leading to dyspnea.   Currently oxygen saturation in mid-90s on 4L [place].  - Continue supplemental oxygen.  - Will need home health to set up oxygen at home. TOC team assisting.  5. Super Morbid Obesity: He has started on semaglutide for weight loss.  6. Joint pain: High suspicion for gout flare.  - Uric Acid 9.5  - On prednisone.  - received 1 x dose of colchicine per IM  - allopurinol per IM  7. Hyponatremia  - Na 129 - c/w iHD   Continue to ambulate w/ PT. Hopefully home soon. D/c per primary team.   Cardiac Meds for  Discharge  Amiodarone 200 mg daily  Eliquis 5 mg bid  Midodrine 20 tid   He has f/u in the AHF.   Length of Stay: 0  Lyda Jester, PA-C  04/15/2021, 8:33 AM  Advanced Heart Failure Team Pager [phone] (M-F; 7a - 5p)  Please contact [facility] for night-coverage after hours (5p -7a ) and weekends on [email or web address]  Agree with the above PA note.    Stable from cardiology standpoint.  Still with pain from gout.  We will sign off, call with further questions.  Meds as above, followup scheduled.   [name] 04/15/2021

## 2021-04-15 NOTE — Progress Notes (Signed)
Currently PROGRESS NOTE    Phillip Young  [id number] DOB: 1986-05-28 DOA: 04/12/2021 PCP: [name], MD    Brief Narrative:  35 year old gentleman with severe biventricular systolic heart failure with known ejection fraction less than 20%, type 2 diabetes, paroxysmal A-flutter on Eliquis and restarted on hemodialysis and recent extensive hospitalization with cardiogenic shock and cardiorenal syndrome progressed to ESRD presents back to the emergency room with shortness of breath, left wrist and right knee hurting.  Unable to ambulate around.  Home oxygen concentrator not working. At the emergency room saturations improved and back on 4 L oxygen.  Chest x-ray with pulmonary edema mostly chronic.  COVID-19 and flu negative.  Unable to ambulate.   Assessment & Plan:   Principal Problem:   Chronic respiratory failure with hypoxia ([name]) Active Problems:   Morbid obesity with body mass index (BMI) of 60.0 to 69.9 in adult [facility])   Chronic systolic CHF (congestive heart failure) ([name])   Type 2 diabetes mellitus with diabetic chronic kidney disease ([name])   ESRD (end stage renal disease) ([name])   Paroxysmal atrial flutter ([name])   Hyponatremia  Chronic respiratory failure with hypoxemia and hypercarbia: -Due to malfunctioning concentrator at home.  Currently on 4 L oxygen and saturating adequate.  Keep on oxygen to keep saturation more than 90%.  Use BiPAP at night without interruption because of underlying obesity hypoventilation, sleep apnea and heart failure.  Stabilized.  Left wrist and right knee pain and left hip pain: Gout flare.   Prednisone trial.  Continue for 5 days. 1 dose of colchicine, can repeat in 2 weeks only. Started on allopurinol, uric acid is 9.6.   As needed pain medications.  Short course of opiates. Mobilize with PT OT.  ESRD and hemodialysis: Reportedly stable.  Followed by nephrology.  Tolerating dialysis with midodrine.  Biventricular heart  failure/paroxysmal atrial fibrillation/hypotension: Stable.  Known EF of less than 20%.  Followed by heart failure team.  Intolerance to advanced heart failure therapies. Midodrine 20 mg 3 times a day.  Fluid management with dialysis.  Not a candidate for ICD as per heart failure team. Currently in sinus rhythm on amiodarone.  Therapeutic on Eliquis.  Morbid obesity: BMI more than 50.  Started on semaglutide.  Ambulatory dysfunction: Work with PT OT.  Still painful.    Medically stabilizing.  Still with significant pain.  Increased dose of pain medications, use muscle relaxants and work with PT OT today to decide whether he can go home.   DVT prophylaxis:  apixaban (ELIQUIS) tablet 5 mg   Code Status: Full code Family Communication: None Disposition Plan: Status is: Observation  The patient will require care spanning > 2 midnights and should be moved to inpatient because: IV treatments appropriate due to intensity of illness or inability to take PO and Inpatient level of care appropriate due to severity of illness  Dispo: The patient is from: Home              Anticipated d/c is to: SNF versus home with home health.              Patient currently is medically stable but unable to ambulate.   Difficult to place patient No         Consultants:  Cardiology Nephrology  Procedures:  None  Antimicrobials:  None   Subjective: Patient seen and examined.  He thinks colchicine helped him relieve pain.  He needs 2 tablets of oxycodone to help with his pain. He has  not walked yet. Talked to him about discharge planning, he tells me that once his pain is slightly relieved he will go home.  Objective: Vitals:   04/15/21 0052 04/15/21 0341 04/15/21 0728 04/15/21 0820  BP:  113/74 (!) 103/49 124/69  Pulse: 73 68 71 69  Resp:  20 20 (!) 21  Temp:  97.8 F (36.6 C) 97.6 F (36.4 C)   TempSrc:  Oral Oral   SpO2: 98% 98% 98% 94%  Weight:      Height:        Intake/Output  Summary (Last 24 hours) at 04/15/2021 1016 Last data filed at 04/15/2021 0800 Gross per 24 hour  Intake 960 ml  Output 3704 ml  Net -2744 ml   Filed Weights   04/12/21 1829 04/13/21 0043  Weight: (!) 189.4 kg (!) 188 kg    Examination:  General: Looks fairly comfortable at rest, in moderate distress with pain on mobility including examination of left wrist and right knee. Cardiovascular: Regular rate rhythm.  Difficult to auscultate. Respiratory: No added sounds.  Difficult to auscultate. Gastrointestinal: Soft and nontender.  Obese and pendulous.  Bowel sounds present. Ext: Chronic edematous legs.  Nonpitting edema. Left wrist joint with some stiffness, no joint effusion or swelling. Right knee with tenderness to palpation but no joint effusion or swelling.   Data Reviewed: I have personally reviewed following labs and imaging studies  CBC: Recent Labs  Lab 04/12/21 1555 04/13/21 0432 04/14/21 0831  WBC 11.9* 9.2 10.0  NEUTROABS 9.2*  --   --   HGB 9.9* 8.5* 8.9*  HCT 29.7* 26.4* 26.9*  MCV 96.4 97.1 98.2  PLT 221 213 [id number]   Basic Metabolic Panel: Recent Labs  Lab 04/12/21 1555 04/13/21 0432 04/14/21 0831  NA 127* 127* 129*  K 4.0 3.9 3.8  CL 86* 85* 84*  CO2 '22 24 23  '$ GLUCOSE 123* 113* 133*  BUN 50* 54* 75*  CREATININE 8.28* 9.04* 9.96*  CALCIUM 8.8* 8.8* 8.4*  PHOS  --   --  8.7*   GFR: Estimated Creatinine Clearance: 17.5 mL/min (A) (by C-G formula based on SCr of 9.96 mg/dL (H)). Liver Function Tests: Recent Labs  Lab 04/12/21 1555 04/14/21 0831  AST 36  --   ALT 32  --   ALKPHOS 84  --   BILITOT 1.2  --   PROT 8.5*  --   ALBUMIN 3.3* 3.0*   Recent Labs  Lab 04/12/21 1555  LIPASE 28   No results for input(s): AMMONIA in the last 168 hours. Coagulation Profile: No results for input(s): INR, PROTIME in the last 168 hours. Cardiac Enzymes: No results for input(s): CKTOTAL, CKMB, CKMBINDEX, TROPONINI in the last 168 hours. BNP (last 3  results) No results for input(s): PROBNP in the last 8760 hours. HbA1C: No results for input(s): HGBA1C in the last 72 hours. CBG: No results for input(s): GLUCAP in the last 168 hours. Lipid Profile: No results for input(s): CHOL, HDL, LDLCALC, TRIG, CHOLHDL, LDLDIRECT in the last 72 hours. Thyroid Function Tests: No results for input(s): TSH, T4TOTAL, FREET4, T3FREE, THYROIDAB in the last 72 hours. Anemia Panel: No results for input(s): VITAMINB12, FOLATE, FERRITIN, TIBC, IRON, RETICCTPCT in the last 72 hours. Sepsis Labs: No results for input(s): PROCALCITON, LATICACIDVEN in the last 168 hours.  Recent Results (from the past 240 hour(s))  Resp Panel by RT-PCR (Flu A&B, Covid) Nasopharyngeal Swab     Status: None   Collection Time: 04/12/21  8:51 PM  Specimen: Nasopharyngeal Swab; Nasopharyngeal(NP) swabs in vial transport medium  Result Value Ref Range Status   SARS Coronavirus 2 by RT PCR NEGATIVE NEGATIVE Final    Comment: (NOTE) SARS-CoV-2 target nucleic acids are NOT DETECTED.  The SARS-CoV-2 RNA is generally detectable in upper respiratory specimens during the acute phase of infection. The lowest concentration of SARS-CoV-2 viral copies this assay can detect is 138 copies/mL. A negative result does not preclude SARS-Cov-2 infection and should not be used as the sole basis for treatment or other patient management decisions. A negative result may occur with  improper specimen collection/handling, submission of specimen other than nasopharyngeal swab, presence of viral mutation(s) within the areas targeted by this assay, and inadequate number of viral copies(<138 copies/mL). A negative result must be combined with clinical observations, patient history, and epidemiological information. The expected result is Negative.  Fact Sheet for Patients:  EntrepreneurPulse.com.au  Fact Sheet for Healthcare Providers:   IncredibleEmployment.be  This test is no t yet approved or cleared by the [place] FDA and  has been authorized for detection and/or diagnosis of SARS-CoV-2 by FDA under an Emergency Use Authorization (EUA). This EUA will remain  in effect (meaning this test can be used) for the duration of the COVID-19 declaration under Section 564(b)(1) of the Act, 21 U.S.C.section 360bbb-3(b)(1), unless the authorization is terminated  or revoked sooner.       Influenza A by PCR NEGATIVE NEGATIVE Final   Influenza B by PCR NEGATIVE NEGATIVE Final    Comment: (NOTE) The Xpert Xpress SARS-CoV-2/FLU/RSV plus assay is intended as an aid in the diagnosis of influenza from Nasopharyngeal swab specimens and should not be used as a sole basis for treatment. Nasal washings and aspirates are unacceptable for Xpert Xpress SARS-CoV-2/FLU/RSV testing.  Fact Sheet for Patients: EntrepreneurPulse.com.au  Fact Sheet for Healthcare Providers: IncredibleEmployment.be  This test is not yet approved or cleared by the [place] FDA and has been authorized for detection and/or diagnosis of SARS-CoV-2 by FDA under an Emergency Use Authorization (EUA). This EUA will remain in effect (meaning this test can be used) for the duration of the COVID-19 declaration under Section 564(b)(1) of the Act, 21 U.S.C. section 360bbb-3(b)(1), unless the authorization is terminated or revoked.  Performed at [facility], [place] [address] [place], [place] [address]   MRSA Next Gen by PCR, Nasal     Status: None   Collection Time: 04/13/21  1:02 AM   Specimen: Nasal Mucosa; Nasal Swab  Result Value Ref Range Status   MRSA by PCR Next Gen NOT DETECTED NOT DETECTED Final    Comment: (NOTE) The GeneXpert MRSA Assay (FDA approved for NASAL specimens only), is one component of a comprehensive MRSA colonization surveillance program. It is not intended to diagnose  MRSA infection nor to guide or monitor treatment for MRSA infections. Test performance is not FDA approved in patients less than 25 years old. Performed at [facility], [place] [address], [place], [place] [address]          Radiology Studies: No results found.      Scheduled Meds:  allopurinol  100 mg Oral Daily   amiodarone  200 mg Oral Daily   apixaban  5 mg Oral BID   Chlorhexidine Gluconate Cloth  6 each Topical Daily   Chlorhexidine Gluconate Cloth  6 each Topical Q0600   lanthanum  500 mg Oral TID WC   midodrine  20 mg Oral TID WC   predniSONE  40 mg Oral Daily   Continuous Infusions:  anticoagulant sodium citrate       LOS: 0 days    Time spent: 30 minutes    Barb Merino, MD [facility] Pager [phone]

## 2021-04-15 NOTE — TOC CM/SW Note (Signed)
SATURATION QUALIFICATIONS: (This note is used to comply with regulatory documentation for home oxygen)  Patient Saturations on Room Air at Rest = 85%  Patient Saturations on 4 Liters of oxygen while Ambulating = 93%  Please briefly explain why patient needs home oxygen: CHF  Jonnie Finner RN3 CCM, Heart Failure TOC CM [phone]

## 2021-04-15 NOTE — Evaluation (Signed)
Physical Therapy Evaluation Patient Details Name: Phillip Young MRN: [id number] DOB: 1986-03-24 Today's Date: 04/15/2021  History of Present Illness  35 y.o. male admitted 9/18 with SOB, sats at 78% and reporting home O2 concentrator not working properly. Pt with recent hospitalization 7/20-9/1 with cardiogenic shock, cardiorenal syndrome which unfortunately progressed to ESRD with need for HD, biventricular heart failure. PMH: CHF, DM 2, a flutter, ESRD on HD TTS.  Clinical Impression  Pt presents to PT with deficits in strength, power, functional mobility, endurance, gait, balance, and with significant pain. Pt reports an inability to utilize LUE due to L wrist pain during session, although able to utilize LUE to urinate but not to hold walker or don mask. Pt continues to require physical assistance to come to a standing position, declining multiple attempts with use of walker and instead requesting physical assistance of PT. Pt will benefit from continued aggressive mobilization and consistent encouragement to perform mobility as independently as possible in order to progress to discharge home. PT recommends home health PT at the time of discharge as well as a bariatric rolling walker.       Recommendations for follow up therapy are one component of a multi-disciplinary discharge planning process, led by the attending physician.  Recommendations may be updated based on patient status, additional functional criteria and insurance authorization.  Follow Up Recommendations Home health PT;Supervision for mobility/OOB    Equipment Recommendations  Other (comment) (bariatric rolling walker)    Recommendations for Other Services       Precautions / Restrictions Precautions Precautions: Other (comment) Precaution Comments: monitor O2 (wears 1 L O2 at home) Restrictions Weight Bearing Restrictions: No      Mobility  Bed Mobility Overal bed mobility: Needs Assistance Bed Mobility:  Rolling;Sidelying to Sit Rolling: Supervision Sidelying to sit: Supervision;HOB elevated       General bed mobility comments: encouragement for pt to attempt mobility without assistance. PT cues for rolling, use of bed rail    Transfers Overall transfer level: Needs assistance Equipment used: 1 person hand held assist Transfers: Sit to/from Stand Sit to Stand: Mod assist;From elevated surface         General transfer comment: pt rocks to initiate stand with use of walker but does not appear to push with RUE from bed. Pt remains insistent on locking arms with therapist to pull up into standing despite PT encouragement to attempt with use of walker in order to practice a more independent transfer technique  Ambulation/Gait Ambulation/Gait assistance: Min guard Gait Distance (Feet): 30 Feet Assistive device: Rolling walker (2 wheeled) Gait Pattern/deviations: Step-to pattern;Wide base of support;Decreased stance time - right Gait velocity: reduced Gait velocity interpretation: <1.8 ft/sec, indicate of risk for recurrent falls General Gait Details: pt with short step-to gait. Reduced stance time on RLE due to pain, although stance time does increase with further ambulation distances. Pt reports inability to utilize LUE on walker due to pain, mobilizing walker with RUE only.  Stairs            Wheelchair Mobility    Modified Rankin (Stroke Patients Only)       Balance Overall balance assessment: Needs assistance Sitting-balance support: No upper extremity supported;Feet supported Sitting balance-Leahy Scale: Good     Standing balance support: No upper extremity supported Standing balance-Leahy Scale: Fair Standing balance comment: pt able to stand to urinate with urinal without UE support of device  Pertinent Vitals/Pain Pain Assessment: Faces Faces Pain Scale: Hurts whole lot Pain Location: R knee and hip, L wrist Pain  Descriptors / Indicators: Guarding Pain Intervention(s): Premedicated before session    Home Living Family/patient expects to be discharged to:: Private residence Living Arrangements: Other relatives (sister) Available Help at Discharge: Family;Available PRN/intermittently Type of Home: House Home Access: Level entry     Home Layout: One level Home Equipment: None Additional Comments: 6-7 steps to enter home at [facility] though pt had been staying with sister (home info above)    Prior Function Level of Independence: Independent         Comments: 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 Independent for ADLs/IADLs. Sponge bathing due to dialysis port     Hand Dominance   Dominant Hand: Right    Extremity/Trunk Assessment   Upper Extremity Assessment Upper Extremity Assessment: LUE deficits/detail LUE Deficits / Details: significant wrist pain limiting LUE function per patient report. Shoulder and elbow motion WFL. Pt declines to attempt to don mask, even with RUE, due to L wrist pain LUE: Unable to fully assess due to pain    Lower Extremity Assessment Lower Extremity Assessment: RLE deficits/detail RLE Deficits / Details: RLE knee and hip pain reported, observed strength at least 4/5 based on functional mobility    Cervical / Trunk Assessment Cervical / Trunk Assessment: Other exceptions Cervical / Trunk Exceptions: morbid obesity  Communication   Communication: No difficulties  Cognition Arousal/Alertness: Awake/alert Behavior During Therapy: WFL for tasks assessed/performed Overall Cognitive Status: Within Functional Limits for tasks assessed                                 General Comments: pt requires significant encouragement to attempt mobility independently prior to requesting assistance      General Comments General comments (skin integrity, edema, etc.): pt on 4L [place] upon PT arrival, desats when weaned to 2L  and requires 4L  with mobility to maintain  sats at or above 92%.    Exercises     Assessment/Plan    PT Assessment Patient needs continued PT services  PT Problem List Decreased activity tolerance;Decreased balance;Decreased mobility;Decreased knowledge of use of DME;Decreased safety awareness;Decreased strength;Cardiopulmonary status limiting activity;Obesity;Pain       PT Treatment Interventions DME instruction;Gait training;Functional mobility training;Therapeutic activities;Therapeutic exercise;Balance training;Neuromuscular re-education;Patient/family education    PT Goals (Current goals can be found in the Care Plan section)  Acute Rehab PT Goals Patient Stated Goal: to reduce pain and return to independent mobility PT Goal Formulation: With patient Time For Goal Achievement: 04/29/21 Potential to Achieve Goals: Good    Frequency Min 3X/week   Barriers to discharge Decreased caregiver support      Co-evaluation               AM-PAC PT "6 Clicks" Mobility  Outcome Measure Help needed turning from your back to your side while in a flat bed without using bedrails?: A Little Help needed moving from lying on your back to sitting on the side of a flat bed without using bedrails?: A Little Help needed moving to and from a bed to a chair (including a wheelchair)?: A Little Help needed standing up from a chair using your arms (e.g., wheelchair or bedside chair)?: A Lot Help needed to walk in hospital room?: A Little Help needed climbing 3-5 steps with a railing? : A Lot 6 Click Score: 16  End of Session Equipment Utilized During Treatment: Oxygen Activity Tolerance: Patient limited by pain Patient left: in bed;with call bell/phone within reach (no alarms on upon arrival) Nurse Communication: Mobility status PT Visit Diagnosis: Difficulty in walking, not elsewhere classified (R26.2);Other abnormalities of gait and mobility (R26.89);Pain Pain - Right/Left: Right Pain - part of body: Knee;Hip    Time:  [id number] PT Time Calculation (min) (ACUTE ONLY): 31 min   Charges:   PT Evaluation $PT Eval Low Complexity: 1 Low          Zenaida Niece, PT, DPT Acute Rehabilitation Pager: [phone]   Zenaida Niece 04/15/2021, 10:52 AM

## 2021-04-15 NOTE — TOC CM/SW Note (Addendum)
HF TOC CM contacted pt's mother, [name] states she was on her way to take back his portable TOC and tanks back to [place]. Mother states they did not receive a call from [place] on yesterday or today about delivering oxygen concentrator. [place] to cancel order for oxygen. Mother wants to utilize DME vendor that delivered [employer]. Contacted [employer] and they will work with getting oxygen delivered to home. Will deliver Rollator and portable to room on 04/16/2021. [place], Heart Failure TOC CM [phone]

## 2021-04-16 ENCOUNTER — Other Ambulatory Visit (HOSPITAL_COMMUNITY): Payer: Self-pay | Admitting: Internal Medicine

## 2021-04-16 ENCOUNTER — Observation Stay (HOSPITAL_COMMUNITY): Payer: BC Managed Care – PPO

## 2021-04-16 ENCOUNTER — Encounter (HOSPITAL_COMMUNITY): Payer: Self-pay | Admitting: Internal Medicine

## 2021-04-16 DIAGNOSIS — M25561 Pain in right knee: Secondary | ICD-10-CM | POA: Diagnosis not present

## 2021-04-16 DIAGNOSIS — E1122 Type 2 diabetes mellitus with diabetic chronic kidney disease: Secondary | ICD-10-CM | POA: Diagnosis not present

## 2021-04-16 DIAGNOSIS — I9589 Other hypotension: Secondary | ICD-10-CM | POA: Diagnosis present

## 2021-04-16 DIAGNOSIS — L02416 Cutaneous abscess of left lower limb: Secondary | ICD-10-CM | POA: Diagnosis not present

## 2021-04-16 DIAGNOSIS — I340 Nonrheumatic mitral (valve) insufficiency: Secondary | ICD-10-CM | POA: Diagnosis not present

## 2021-04-16 DIAGNOSIS — I132 Hypertensive heart and chronic kidney disease with heart failure and with stage 5 chronic kidney disease, or end stage renal disease: Secondary | ICD-10-CM | POA: Diagnosis present

## 2021-04-16 DIAGNOSIS — I081 Rheumatic disorders of both mitral and tricuspid valves: Secondary | ICD-10-CM | POA: Diagnosis not present

## 2021-04-16 DIAGNOSIS — M25552 Pain in left hip: Secondary | ICD-10-CM | POA: Diagnosis not present

## 2021-04-16 DIAGNOSIS — J9621 Acute and chronic respiratory failure with hypoxia: Secondary | ICD-10-CM | POA: Diagnosis not present

## 2021-04-16 DIAGNOSIS — Y848 Other medical procedures as the cause of abnormal reaction of the patient, or of later complication, without mention of misadventure at the time of the procedure: Secondary | ICD-10-CM | POA: Diagnosis present

## 2021-04-16 DIAGNOSIS — M1611 Unilateral primary osteoarthritis, right hip: Secondary | ICD-10-CM | POA: Diagnosis not present

## 2021-04-16 DIAGNOSIS — I5022 Chronic systolic (congestive) heart failure: Secondary | ICD-10-CM | POA: Diagnosis present

## 2021-04-16 DIAGNOSIS — Z6841 Body Mass Index (BMI) 40.0 and over, adult: Secondary | ICD-10-CM | POA: Diagnosis not present

## 2021-04-16 DIAGNOSIS — J811 Chronic pulmonary edema: Secondary | ICD-10-CM | POA: Diagnosis not present

## 2021-04-16 DIAGNOSIS — A419 Sepsis, unspecified organism: Secondary | ICD-10-CM | POA: Diagnosis not present

## 2021-04-16 DIAGNOSIS — J9601 Acute respiratory failure with hypoxia: Secondary | ICD-10-CM | POA: Diagnosis not present

## 2021-04-16 DIAGNOSIS — Z20822 Contact with and (suspected) exposure to covid-19: Secondary | ICD-10-CM | POA: Diagnosis not present

## 2021-04-16 DIAGNOSIS — M25532 Pain in left wrist: Secondary | ICD-10-CM | POA: Diagnosis not present

## 2021-04-16 DIAGNOSIS — I420 Dilated cardiomyopathy: Secondary | ICD-10-CM | POA: Diagnosis present

## 2021-04-16 DIAGNOSIS — K429 Umbilical hernia without obstruction or gangrene: Secondary | ICD-10-CM | POA: Diagnosis not present

## 2021-04-16 DIAGNOSIS — I959 Hypotension, unspecified: Secondary | ICD-10-CM | POA: Diagnosis not present

## 2021-04-16 DIAGNOSIS — B9561 Methicillin susceptible Staphylococcus aureus infection as the cause of diseases classified elsewhere: Secondary | ICD-10-CM | POA: Diagnosis not present

## 2021-04-16 DIAGNOSIS — E662 Morbid (severe) obesity with alveolar hypoventilation: Secondary | ICD-10-CM | POA: Diagnosis not present

## 2021-04-16 DIAGNOSIS — J9690 Respiratory failure, unspecified, unspecified whether with hypoxia or hypercapnia: Secondary | ICD-10-CM | POA: Diagnosis not present

## 2021-04-16 DIAGNOSIS — M25461 Effusion, right knee: Secondary | ICD-10-CM | POA: Diagnosis not present

## 2021-04-16 DIAGNOSIS — J9610 Chronic respiratory failure, unspecified whether with hypoxia or hypercapnia: Secondary | ICD-10-CM | POA: Diagnosis not present

## 2021-04-16 DIAGNOSIS — E871 Hypo-osmolality and hyponatremia: Secondary | ICD-10-CM | POA: Diagnosis not present

## 2021-04-16 DIAGNOSIS — I509 Heart failure, unspecified: Secondary | ICD-10-CM | POA: Diagnosis not present

## 2021-04-16 DIAGNOSIS — R0602 Shortness of breath: Secondary | ICD-10-CM | POA: Diagnosis not present

## 2021-04-16 DIAGNOSIS — D631 Anemia in chronic kidney disease: Secondary | ICD-10-CM | POA: Diagnosis present

## 2021-04-16 DIAGNOSIS — T80211A Bloodstream infection due to central venous catheter, initial encounter: Secondary | ICD-10-CM | POA: Diagnosis not present

## 2021-04-16 DIAGNOSIS — M609 Myositis, unspecified: Secondary | ICD-10-CM | POA: Diagnosis not present

## 2021-04-16 DIAGNOSIS — R609 Edema, unspecified: Secondary | ICD-10-CM | POA: Diagnosis not present

## 2021-04-16 DIAGNOSIS — K4090 Unilateral inguinal hernia, without obstruction or gangrene, not specified as recurrent: Secondary | ICD-10-CM | POA: Diagnosis not present

## 2021-04-16 DIAGNOSIS — N186 End stage renal disease: Secondary | ICD-10-CM

## 2021-04-16 DIAGNOSIS — A4102 Sepsis due to Methicillin resistant Staphylococcus aureus: Secondary | ICD-10-CM | POA: Diagnosis not present

## 2021-04-16 DIAGNOSIS — J9622 Acute and chronic respiratory failure with hypercapnia: Secondary | ICD-10-CM | POA: Diagnosis present

## 2021-04-16 DIAGNOSIS — I5023 Acute on chronic systolic (congestive) heart failure: Secondary | ICD-10-CM | POA: Diagnosis not present

## 2021-04-16 DIAGNOSIS — Z4901 Encounter for fitting and adjustment of extracorporeal dialysis catheter: Secondary | ICD-10-CM | POA: Diagnosis not present

## 2021-04-16 DIAGNOSIS — Z992 Dependence on renal dialysis: Secondary | ICD-10-CM | POA: Diagnosis not present

## 2021-04-16 DIAGNOSIS — J9811 Atelectasis: Secondary | ICD-10-CM | POA: Diagnosis not present

## 2021-04-16 DIAGNOSIS — I5084 End stage heart failure: Secondary | ICD-10-CM | POA: Diagnosis present

## 2021-04-16 DIAGNOSIS — T8249XA Other complication of vascular dialysis catheter, initial encounter: Secondary | ICD-10-CM | POA: Diagnosis not present

## 2021-04-16 DIAGNOSIS — R7881 Bacteremia: Secondary | ICD-10-CM | POA: Diagnosis not present

## 2021-04-16 DIAGNOSIS — Z8616 Personal history of COVID-19: Secondary | ICD-10-CM | POA: Diagnosis not present

## 2021-04-16 DIAGNOSIS — R7303 Prediabetes: Secondary | ICD-10-CM | POA: Diagnosis not present

## 2021-04-16 DIAGNOSIS — J9611 Chronic respiratory failure with hypoxia: Secondary | ICD-10-CM | POA: Diagnosis not present

## 2021-04-16 DIAGNOSIS — I5082 Biventricular heart failure: Secondary | ICD-10-CM | POA: Diagnosis not present

## 2021-04-16 DIAGNOSIS — R519 Headache, unspecified: Secondary | ICD-10-CM | POA: Diagnosis not present

## 2021-04-16 DIAGNOSIS — N179 Acute kidney failure, unspecified: Secondary | ICD-10-CM | POA: Diagnosis not present

## 2021-04-16 LAB — CBC
HCT: 27.7 % — ABNORMAL LOW (ref 39.0–52.0)
Hemoglobin: 8.4 g/dL — ABNORMAL LOW (ref 13.0–17.0)
MCH: 30.9 pg (ref 26.0–34.0)
MCHC: 30.3 g/dL (ref 30.0–36.0)
MCV: 101.8 fL — ABNORMAL HIGH (ref 80.0–100.0)
Platelets: 319 10*3/uL (ref 150–400)
RBC: 2.72 MIL/uL — ABNORMAL LOW (ref 4.22–5.81)
RDW: 19.8 % — ABNORMAL HIGH (ref 11.5–15.5)
WBC: 15.3 10*3/uL — ABNORMAL HIGH (ref 4.0–10.5)
nRBC: 0.5 % — ABNORMAL HIGH (ref 0.0–0.2)

## 2021-04-16 LAB — RENAL FUNCTION PANEL
Albumin: 2.9 g/dL — ABNORMAL LOW (ref 3.5–5.0)
Anion gap: 15 (ref 5–15)
BUN: 65 mg/dL — ABNORMAL HIGH (ref 6–20)
CO2: 25 mmol/L (ref 22–32)
Calcium: 8.7 mg/dL — ABNORMAL LOW (ref 8.9–10.3)
Chloride: 87 mmol/L — ABNORMAL LOW (ref 98–111)
Creatinine, Ser: 7.87 mg/dL — ABNORMAL HIGH (ref 0.61–1.24)
GFR, Estimated: 8 mL/min — ABNORMAL LOW (ref >60)
Glucose, Bld: 143 mg/dL — ABNORMAL HIGH (ref 70–99)
Phosphorus: 8.8 mg/dL — ABNORMAL HIGH (ref 2.5–4.6)
Potassium: 4.6 mmol/L (ref 3.5–5.1)
Sodium: 127 mmol/L — ABNORMAL LOW (ref 135–145)

## 2021-04-16 IMAGING — CT CT HIP*R* W/CM
2 of 3 series · 17 of 46 positions shown, 19 images · IV contrast (omnipaque)
Comparison: [DATE]

CLINICAL DATA: Right hip pain, limping.  No known injury.

EXAM:
CT OF THE LOWER RIGHT EXTREMITY WITH CONTRAST
TECHNIQUE: Multidetector CT imaging of the lower right extremity was performed
according to the standard protocol following intravenous contrast
administration.
CONTRAST:  100mL OMNIPAQUE IOHEXOL 350 MG/ML SOLN

[Series 6: hip 2.0 st · axial · 0.46mm/px · z∈[+1042,+1272]mm · 14 of 133 slices shown, 16 images]
[im 9/133  soft-tissue]
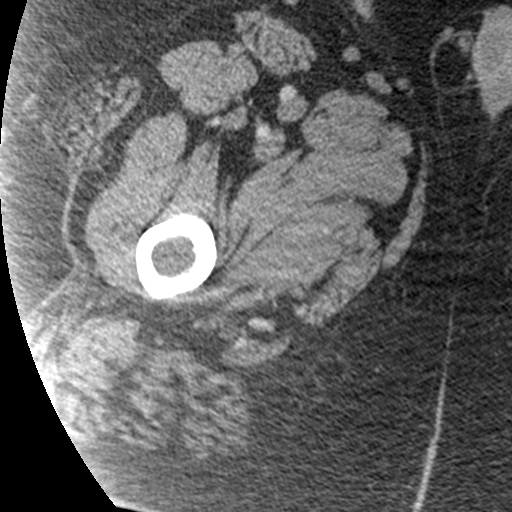
[im 9/133  bone]
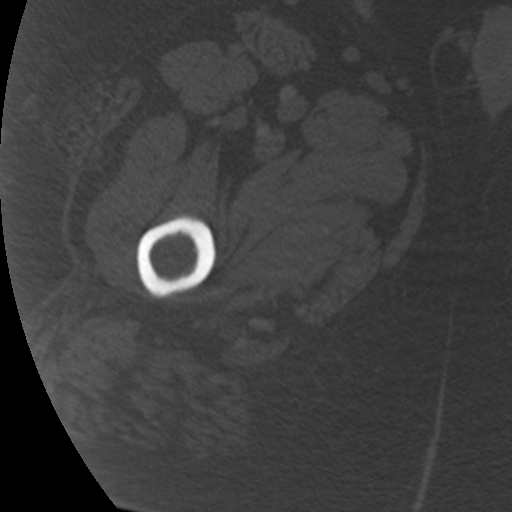
[im 18/133  soft-tissue]
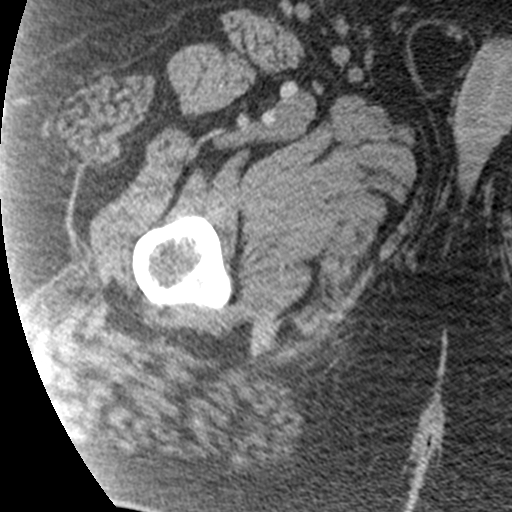
[im 26/133  soft-tissue]
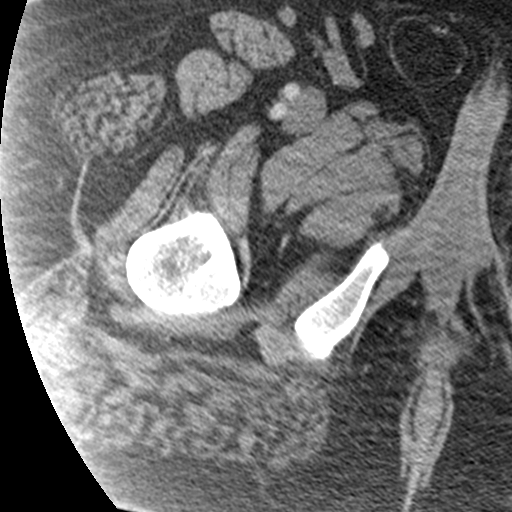
[im 35/133  soft-tissue]
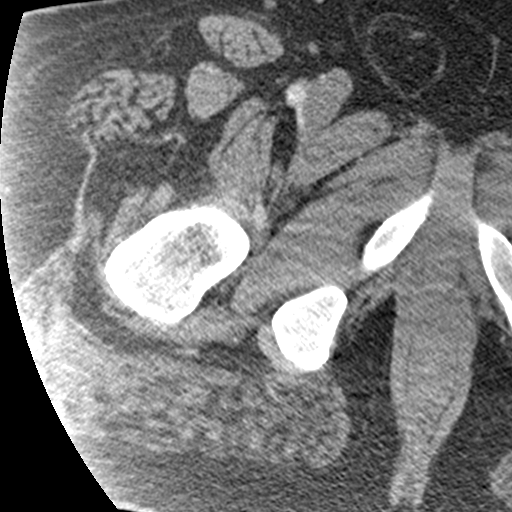
[im 43/133  soft-tissue]
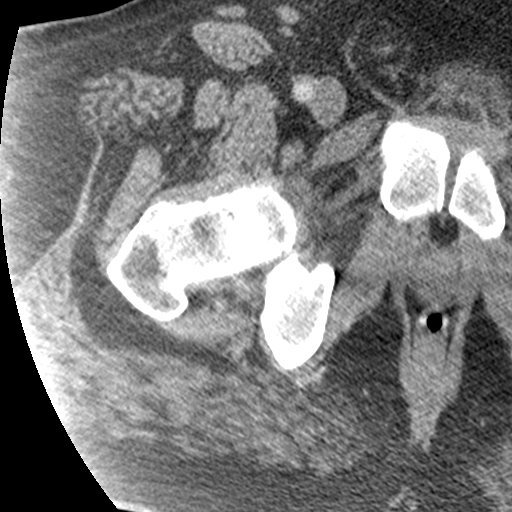
[im 52/133  soft-tissue]
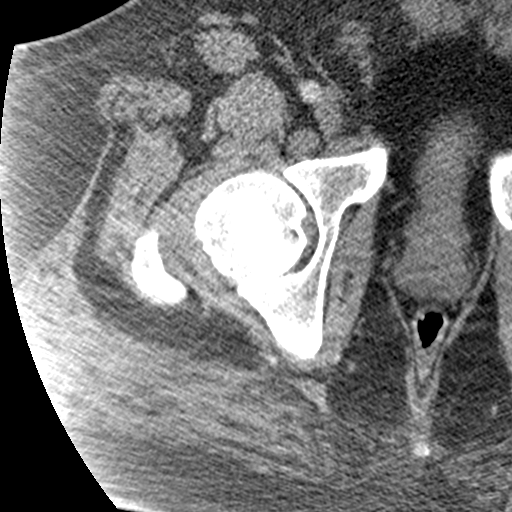
[im 60/133  soft-tissue]
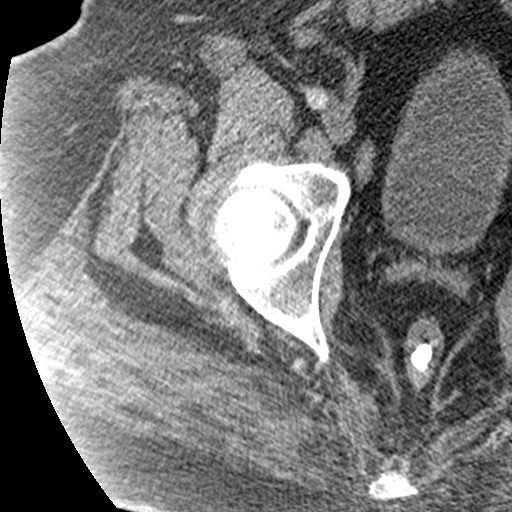
[im 73/133  soft-tissue]
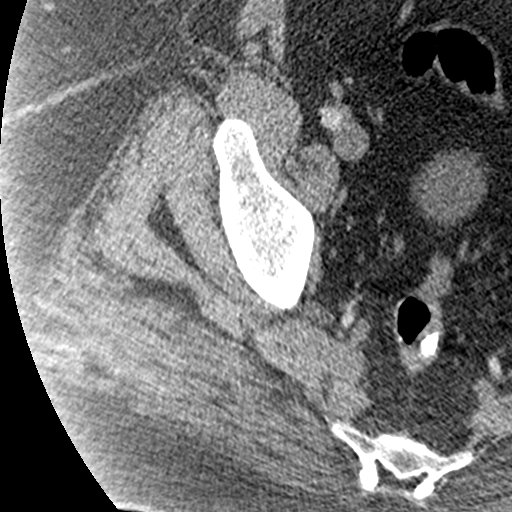
[im 81/133  soft-tissue]
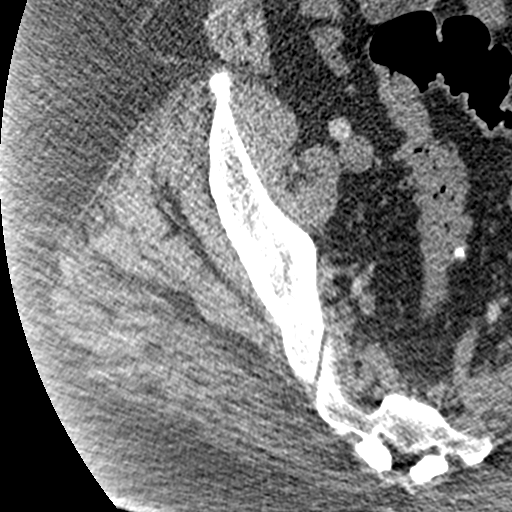
[im 81/133  bone]
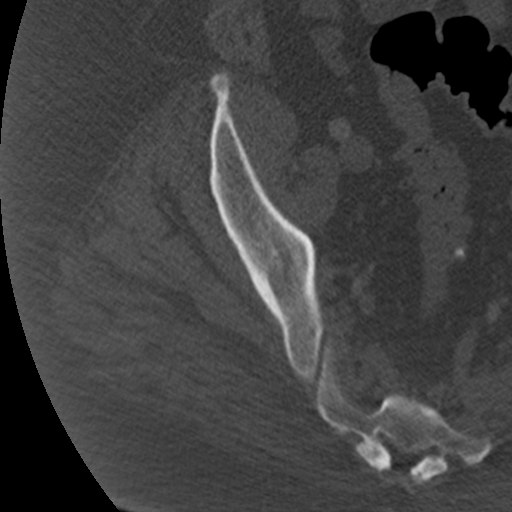
[im 90/133  soft-tissue]
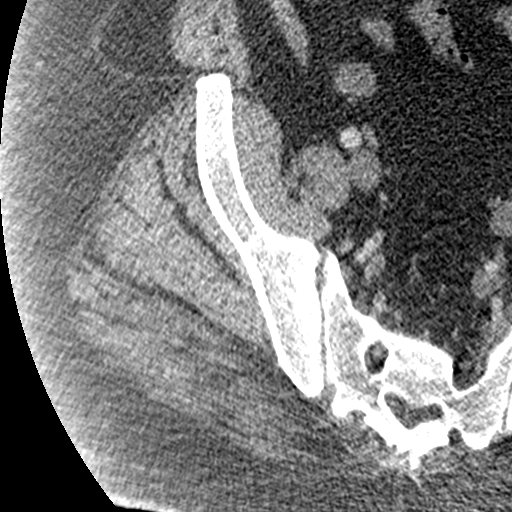
[im 98/133  soft-tissue]
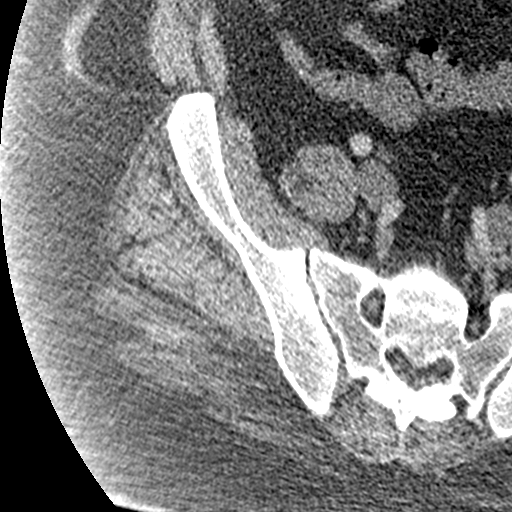
[im 107/133  soft-tissue]
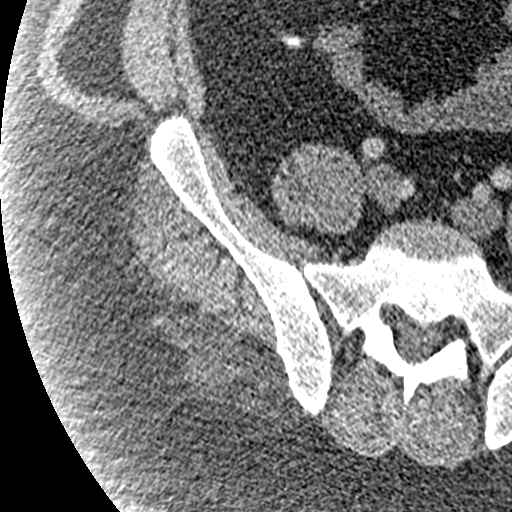
[im 115/133  soft-tissue]
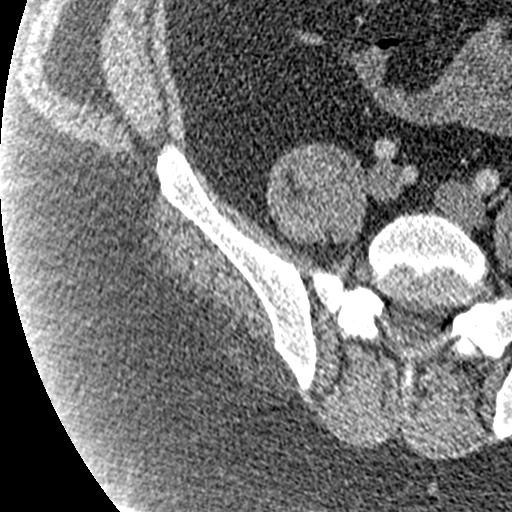
[im 124/133  soft-tissue]
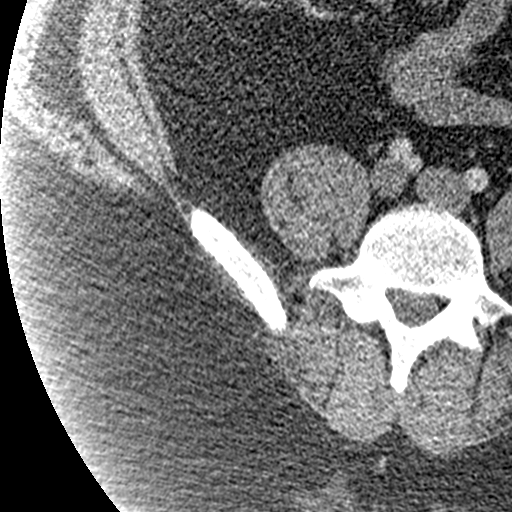

[Series 9: hip 2.0 cor. st · coronal · 0.58mm/px · 3 of 127 slices shown]
[im 43/127  soft-tissue]
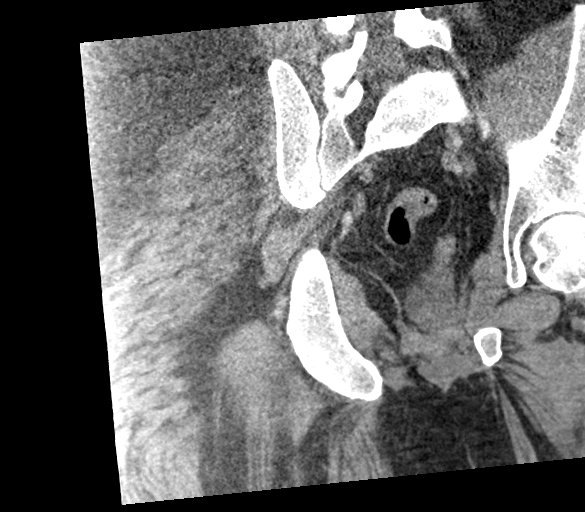
[im 57/127  soft-tissue]
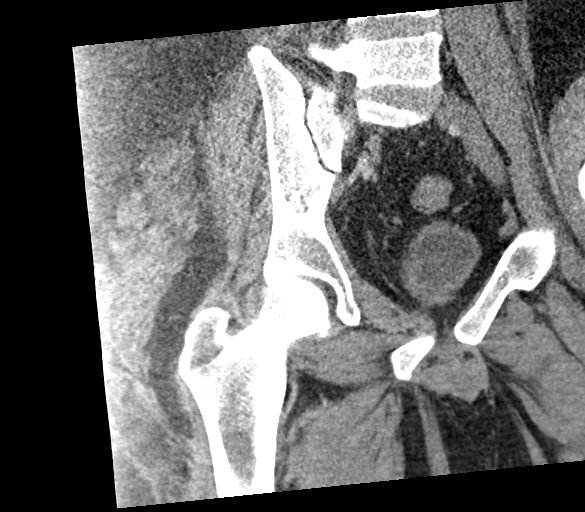
[im 71/127  soft-tissue]
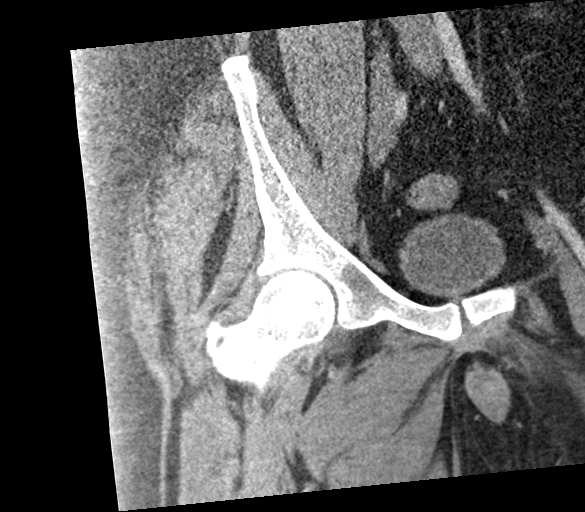

[17 of 46 positions shown; findings below may reference images not displayed]

FINDINGS: Bones/Joint/Cartilage

No acute fracture. No dislocation. No evidence of femoral head
avascular necrosis. Visualized portion of the right hemipelvis is
intact. Pubic symphysis and right SI joint intact without diastasis.
No lytic or sclerotic bony lesions. No erosion or periosteal
elevation. Mild joint space narrowing of the right hip. No
appreciable hip joint effusion.

Ligaments

Suboptimally assessed by CT.

Muscles and Tendons

No acute musculotendinous abnormality by CT. No intramuscular fluid
collection. No appreciable bursal fluid collection.

Soft tissues

No soft tissue edema or fluid collections. No right inguinal
lymphadenopathy is seen. No acute findings are evident within the
visualized right hemipelvis.
IMPRESSION: 1. No acute osseous abnormality of the right hip.
2. Mild degenerative changes of the right hip.

## 2021-04-16 MED ORDER — IOHEXOL 350 MG/ML SOLN
100.0000 mL | Freq: Once | INTRAVENOUS | Status: AC | PRN
  Administered 2021-04-16: 100 mL via INTRAVENOUS

## 2021-04-16 MED ORDER — ALTEPLASE 2 MG IJ SOLR
3.8000 mg | Freq: Once | INTRAMUSCULAR | Status: AC
  Administered 2021-04-16: 3.8 mg

## 2021-04-16 NOTE — TOC Progression Note (Addendum)
Transition of Care [facility]) - Progression Note    Patient Details  Name: Phillip Young MRN: [id number] Date of Birth: 1986/04/13  Transition of Care [facility]) CM/SW [place], [place] Phone Number: 04/16/2021, 1:04 PM  Clinical Narrative:    HF CSW reached out to Bowman with [employer] [phone] to confirm that the oxygen and rollator have been delivered for Mr. Saldutti and [name] confirmed.    Expected Discharge Plan: Home/Self Care Barriers to Discharge: Continued Medical Work up  Expected Discharge Plan and Services Expected Discharge Plan: Home/Self Care In-house Referral: Clinical Social Work Discharge Planning Services: CM Consult Post Acute Care Choice: Durable Medical Equipment Living arrangements for the past 2 months: Single Family Home                   DME Agency: [employer] Date DME Agency Contacted: 04/14/21 Time DME Agency Contacted: [phone] Representative spoke with at DME Agency: Andree Coss             Social Determinants of Health (SDOH) Interventions Food Insecurity Interventions: Assist with SNAP Application Financial Strain Interventions: Other (Comment) (Referral to [facility]) Housing Interventions: Intervention Not Indicated Transportation Interventions: Intervention Not Indicated  Readmission Risk Interventions Readmission Risk Prevention Plan 10/16/2020 10/16/2020 10/14/2020  Transportation Screening Complete Complete Complete  PCP or Specialist Appt within 5-7 Days Complete Complete Complete  Home Care Screening Complete - -  Medication Review (RN CM) Referral to Pharmacy - -  Some recent data might be hidden   Ibn Stief, MSW, [place] Heart Failure Social Worker

## 2021-04-16 NOTE — Telephone Encounter (Signed)
Reached back out to [place] to ask Per Sueanne Margarita, MD: [facility] you say noninvasive ventilator is it a BiPAP     No, it is not a Bipap or anything,not a trach just has 02 and the noninvasive ventilator. The RT at [facility] his vent.

## 2021-04-16 NOTE — Progress Notes (Signed)
Currently PROGRESS NOTE    Phillip Young  [id number] DOB: 1985-11-24 DOA: 04/12/2021 PCP: [name], MD    Brief Narrative:  35 year old gentleman with severe biventricular systolic heart failure with known ejection fraction less than 20%, type 2 diabetes, paroxysmal A-flutter on Eliquis and  recently started on hemodialysis and recent extensive hospitalization with cardiogenic shock and cardiorenal syndrome progressed to ESRD presents back to the emergency room with shortness of breath, left wrist and right knee hurting.  Unable to ambulate around.  Home oxygen concentrator not working. At the emergency room saturations improved and back on 4 L oxygen.  Chest x-ray with pulmonary edema mostly chronic.  COVID-19 and flu negative.  Unable to ambulate.   Assessment & Plan:   Principal Problem:   Chronic respiratory failure with hypoxia ([name]) Active Problems:   Morbid obesity with body mass index (BMI) of 60.0 to 69.9 in adult [facility])   Chronic systolic CHF (congestive heart failure) ([name])   Type 2 diabetes mellitus with diabetic chronic kidney disease ([name])   ESRD (end stage renal disease) ([name])   Paroxysmal atrial flutter ([name])   Hyponatremia  Chronic respiratory failure with hypoxemia and hypercarbia: Due to malfunctioning concentrator at home.  Currently on 4 L oxygen and saturating adequate.  Keep on oxygen to keep saturation more than 90%.  Use BiPAP at night without interruption because of underlying obesity hypoventilation, sleep apnea and heart failure.  Stabilized. Case management coordinating oxygen delivery at home.  Left wrist and right knee pain and new eft hip pain: Suspected gout flare.   Prednisone trial.  Continue for 5 days. 1 dose of colchicine, can repeat in 2 weeks only. Started on allopurinol, uric acid is 9.6.   As needed pain medications.  Short course of opiates. Mobilize with PT OT. Continue to use oxycodone and low-dose Flexeril.  Hot  packs. 9/21, patient complained of new onset hip pain.  CT scan with contrast is normal.  Difficult to do joint exam because of morbid obesity, however he does not have any abnormality on joint examination. Not sure when his pain is going to be controlled.  ESRD and hemodialysis: Reportedly stable.  Followed by nephrology.  Tolerating dialysis with midodrine.  Biventricular heart failure/paroxysmal atrial fibrillation/hypotension: Stable.  Known EF of less than 20%.  Followed by heart failure team.  Intolerance to advanced heart failure therapies. Midodrine 20 mg 3 times a day.  Fluid management with dialysis.  Not a candidate for ICD as per heart failure team. Currently in sinus rhythm on amiodarone.  Therapeutic on Eliquis.  Morbid obesity: BMI more than 50.  Started on semaglutide.  Ambulatory dysfunction: Work with PT OT.  Recommended home with home health PT OT.   Medically stabilizing.  Still with significant pain.  Increased dose of pain medications, use muscle relaxants and work with PT OT.   DVT prophylaxis:  apixaban (ELIQUIS) tablet 5 mg   Code Status: Full code Family Communication: None Disposition Plan: Status is: Observation  The patient will require care spanning > 2 midnights and should be moved to inpatient because: IV treatments appropriate due to intensity of illness or inability to take PO and Inpatient level of care appropriate due to severity of illness  Dispo: The patient is from: Home              Anticipated d/c is to: SNF versus home with home health.              Patient currently is  medically stable but unable to ambulate.   Difficult to place patient No         Consultants:  Cardiology Nephrology  Procedures:  None  Antimicrobials:  None   Subjective: Patient seen and examined.  Continues to complain of pain despite using 10 mg of oxycodone.  He was wondering whether we can go up on the oxycodone doses.  Right hip pain is  new. Difficult to assess clinically his joints, a CT scan with contrast is essentially normal. Patient tells me he cannot go home when he is so much painful.  Objective: Vitals:   04/16/21 0346 04/16/21 0400 04/16/21 0741 04/16/21 1127  BP: 123/71 (!) 92/54 97/63 112/65  Pulse: 73  94   Resp: '16  20 20  '$ Temp: 98.8 F (37.1 C)  98.8 F (37.1 C) 98.7 F (37.1 C)  TempSrc: Oral  Oral Oral  SpO2: 95%  93% 93%  Weight:      Height:        Intake/Output Summary (Last 24 hours) at 04/16/2021 1204 Last data filed at 04/16/2021 [id number] Gross per 24 hour  Intake 843.8 ml  Output 400 ml  Net 443.8 ml   Filed Weights   04/12/21 1829 04/13/21 0043  Weight: (!) 189.4 kg (!) 188 kg    Examination:  General: Looks fairly comfortable at rest, in moderate distress with pain on mobility including examination of left wrist and right knee. Morbidly obese.  Comfortable at rest.  Anxious on mobility. Cardiovascular: Regular rate rhythm.  Difficult to auscultate. Respiratory: No added sounds.  Difficult to auscultate. Gastrointestinal: Soft and nontender.  Obese and pendulous.  Bowel sounds present. Ext: Chronic edematous legs.  Nonpitting edema. Left wrist joint with some stiffness, no joint effusion or swelling. Right knee with tenderness to palpation but no joint effusion or swelling.   Data Reviewed: I have personally reviewed following labs and imaging studies  CBC: Recent Labs  Lab 04/12/21 1555 04/13/21 0432 04/14/21 0831  WBC 11.9* 9.2 10.0  NEUTROABS 9.2*  --   --   HGB 9.9* 8.5* 8.9*  HCT 29.7* 26.4* 26.9*  MCV 96.4 97.1 98.2  PLT 221 213 [id number]   Basic Metabolic Panel: Recent Labs  Lab 04/12/21 1555 04/13/21 0432 04/14/21 0831  NA 127* 127* 129*  K 4.0 3.9 3.8  CL 86* 85* 84*  CO2 '22 24 23  '$ GLUCOSE 123* 113* 133*  BUN 50* 54* 75*  CREATININE 8.28* 9.04* 9.96*  CALCIUM 8.8* 8.8* 8.4*  PHOS  --   --  8.7*   GFR: Estimated Creatinine Clearance: 17.5 mL/min (A) (by  C-G formula based on SCr of 9.96 mg/dL (H)). Liver Function Tests: Recent Labs  Lab 04/12/21 1555 04/14/21 0831  AST 36  --   ALT 32  --   ALKPHOS 84  --   BILITOT 1.2  --   PROT 8.5*  --   ALBUMIN 3.3* 3.0*   Recent Labs  Lab 04/12/21 1555  LIPASE 28   No results for input(s): AMMONIA in the last 168 hours. Coagulation Profile: No results for input(s): INR, PROTIME in the last 168 hours. Cardiac Enzymes: No results for input(s): CKTOTAL, CKMB, CKMBINDEX, TROPONINI in the last 168 hours. BNP (last 3 results) No results for input(s): PROBNP in the last 8760 hours. HbA1C: No results for input(s): HGBA1C in the last 72 hours. CBG: No results for input(s): GLUCAP in the last 168 hours. Lipid Profile: No results for input(s): CHOL, HDL, LDLCALC, TRIG,  CHOLHDL, LDLDIRECT in the last 72 hours. Thyroid Function Tests: No results for input(s): TSH, T4TOTAL, FREET4, T3FREE, THYROIDAB in the last 72 hours. Anemia Panel: No results for input(s): VITAMINB12, FOLATE, FERRITIN, TIBC, IRON, RETICCTPCT in the last 72 hours. Sepsis Labs: No results for input(s): PROCALCITON, LATICACIDVEN in the last 168 hours.  Recent Results (from the past 240 hour(s))  Resp Panel by RT-PCR (Flu A&B, Covid) Nasopharyngeal Swab     Status: None   Collection Time: 04/12/21  8:51 PM   Specimen: Nasopharyngeal Swab; Nasopharyngeal(NP) swabs in vial transport medium  Result Value Ref Range Status   SARS Coronavirus 2 by RT PCR NEGATIVE NEGATIVE Final    Comment: (NOTE) SARS-CoV-2 target nucleic acids are NOT DETECTED.  The SARS-CoV-2 RNA is generally detectable in upper respiratory specimens during the acute phase of infection. The lowest concentration of SARS-CoV-2 viral copies this assay can detect is 138 copies/mL. A negative result does not preclude SARS-Cov-2 infection and should not be used as the sole basis for treatment or other patient management decisions. A negative result may occur with   improper specimen collection/handling, submission of specimen other than nasopharyngeal swab, presence of viral mutation(s) within the areas targeted by this assay, and inadequate number of viral copies(<138 copies/mL). A negative result must be combined with clinical observations, patient history, and epidemiological information. The expected result is Negative.  Fact Sheet for Patients:  EntrepreneurPulse.com.au  Fact Sheet for Healthcare Providers:  IncredibleEmployment.be  This test is no t yet approved or cleared by the [place] FDA and  has been authorized for detection and/or diagnosis of SARS-CoV-2 by FDA under an Emergency Use Authorization (EUA). This EUA will remain  in effect (meaning this test can be used) for the duration of the COVID-19 declaration under Section 564(b)(1) of the Act, 21 U.S.C.section 360bbb-3(b)(1), unless the authorization is terminated  or revoked sooner.       Influenza A by PCR NEGATIVE NEGATIVE Final   Influenza B by PCR NEGATIVE NEGATIVE Final    Comment: (NOTE) The Xpert Xpress SARS-CoV-2/FLU/RSV plus assay is intended as an aid in the diagnosis of influenza from Nasopharyngeal swab specimens and should not be used as a sole basis for treatment. Nasal washings and aspirates are unacceptable for Xpert Xpress SARS-CoV-2/FLU/RSV testing.  Fact Sheet for Patients: EntrepreneurPulse.com.au  Fact Sheet for Healthcare Providers: IncredibleEmployment.be  This test is not yet approved or cleared by the [place] FDA and has been authorized for detection and/or diagnosis of SARS-CoV-2 by FDA under an Emergency Use Authorization (EUA). This EUA will remain in effect (meaning this test can be used) for the duration of the COVID-19 declaration under Section 564(b)(1) of the Act, 21 U.S.C. section 360bbb-3(b)(1), unless the authorization is terminated  or revoked.  Performed at [facility], [place] [address] [place], [place] [address]   MRSA Next Gen by PCR, Nasal     Status: None   Collection Time: 04/13/21  1:02 AM   Specimen: Nasal Mucosa; Nasal Swab  Result Value Ref Range Status   MRSA by PCR Next Gen NOT DETECTED NOT DETECTED Final    Comment: (NOTE) The GeneXpert MRSA Assay (FDA approved for NASAL specimens only), is one component of a comprehensive MRSA colonization surveillance program. It is not intended to diagnose MRSA infection nor to guide or monitor treatment for MRSA infections. Test performance is not FDA approved in patients less than 61 years old. Performed at [facility], [place] [address]  [place], [place] [address]          Radiology Studies: CT HIP RIGHT W CONTRAST  Result Date: 04/16/2021 CLINICAL DATA:  Right hip pain, limping.  No known injury. EXAM: CT OF THE LOWER RIGHT EXTREMITY WITH CONTRAST TECHNIQUE: Multidetector CT imaging of the lower right extremity was performed according to the standard protocol following intravenous contrast administration. CONTRAST:  178m OMNIPAQUE IOHEXOL 350 MG/ML SOLN COMPARISON:  02/19/2020 FINDINGS: Bones/Joint/Cartilage No acute fracture. No dislocation. No evidence of femoral head avascular necrosis. Visualized portion of the right hemipelvis is intact. Pubic symphysis and right SI joint intact without diastasis. No lytic or sclerotic bony lesions. No erosion or periosteal elevation. Mild joint space narrowing of the right hip. No appreciable hip joint effusion. Ligaments Suboptimally assessed by CT. Muscles and Tendons No acute musculotendinous abnormality by CT. No intramuscular fluid collection. No appreciable bursal fluid collection. Soft tissues No soft tissue edema or fluid collections. No right inguinal lymphadenopathy is seen. No acute findings are evident within the visualized right hemipelvis. IMPRESSION: 1. No acute osseous abnormality of the right  hip. 2. Mild degenerative changes of the right hip. Electronically Signed   By: NDavina PokeD.O.   On: 04/16/2021 11:27        Scheduled Meds:  allopurinol  100 mg Oral Daily   amiodarone  200 mg Oral Daily   apixaban  5 mg Oral BID   Chlorhexidine Gluconate Cloth  6 each Topical Daily   Chlorhexidine Gluconate Cloth  6 each Topical Q0600   lanthanum  500 mg Oral TID WC   midodrine  20 mg Oral TID WC   predniSONE  40 mg Oral Daily   Continuous Infusions:  anticoagulant sodium citrate       LOS: 0 days    Time spent: 30 minutes    KBarb Merino MD [facility] Pager [phone]

## 2021-04-16 NOTE — Progress Notes (Signed)
[facility] Progress Note  Subjective: seen in room. Main c/o's are pain, R knee now is "going to my hip". No resp issues.   Vitals:   04/16/21 0346 04/16/21 0400 04/16/21 0741 04/16/21 1127  BP: 123/71 (!) 92/54 97/63 112/65  Pulse: 73  94   Resp: '16  20 20  '$ Temp: 98.8 F (37.1 C)  98.8 F (37.1 C) 98.7 F (37.1 C)  TempSrc: Oral  Oral Oral  SpO2: 95%  93% 93%  Weight:      Height:        Exam:  alert, nad , obese, nasal O2  Lying flat, no ^wob  no jvd  Chest cta bilat  Cor reg no RG  Abd soft ntnd no ascites   Ext no LE edema   Alert, NF O x3  RIJ TDC      Home meds include - eliquis 5 bid, amiodarone, lanthanum 500 mg ac tid, midodrine '20mg'$  tid      OP HD: TTS/ AKI [facility]    4h  400/800    188.5kg   2/2 bath  TDC RIJ  Hep none   - no in center meds     Assessment/ Plan: SOB - likely that his O2 equipment ran out or malfunctioned. SOB resolved w/ O2 supplied here. Did not look overloaded on admission. Was at his dry wt at time of admission.  Volume - no wt's today, no vol ^ on exam. Stable O2 support. 3-4 L off w/ HD Tuesday, plan 3 L UF w/ HD today ESRD - on HD TTS. HD today.  Bivent CHF - severe Hypotension - cont midodrine at high dose 20 tid R knee/ L wrist pain - joints painful and tender, started on prednisone for suspected gout Atrial fib - on amio/ eliquis MBD ckd - cont binder Anemia ckd - Hb 9.9 here, stable       Rob Suzanne Garbers 04/16/2021, 12:10 PM   Recent Labs  Lab 04/13/21 0432 04/14/21 0831  K 3.9 3.8  BUN 54* 75*  CREATININE 9.04* 9.96*  CALCIUM 8.8* 8.4*  PHOS  --  8.7*  HGB 8.5* 8.9*    Inpatient medications:  allopurinol  100 mg Oral Daily   amiodarone  200 mg Oral Daily   apixaban  5 mg Oral BID   Chlorhexidine Gluconate Cloth  6 each Topical Daily   Chlorhexidine Gluconate Cloth  6 each Topical Q0600   lanthanum  500 mg Oral TID WC   midodrine  20 mg Oral TID WC   predniSONE  40 mg Oral Daily     anticoagulant sodium citrate     acetaminophen **OR** acetaminophen, anticoagulant sodium citrate, cyclobenzaprine, ipratropium-albuterol, ondansetron **OR** ondansetron (ZOFRAN) IV, oxyCODONE-acetaminophen

## 2021-04-16 NOTE — Progress Notes (Signed)
Per Dr Laurena Bering, ok to give percocet with flexeril at 10:30 before CT scan

## 2021-04-16 NOTE — Progress Notes (Signed)
PT Cancellation Note  Patient Details Name: Phillip Young MRN: [id number] DOB: 01-Sep-1985   Cancelled Treatment:    Reason Eval/Treat Not Completed: Other (comment).  Pt was unavailable for PT earlier then was sent to HD.  Follow up as time and pt allow.   Ramond Dial 04/16/2021, 3:40 PM  Mee Hives, PT MS Acute Rehab Dept. Number: [place] [facility] [place]

## 2021-04-16 NOTE — Progress Notes (Signed)
HD tx complete-pt stable. Pt noted with high arterial pressures despite switching of blood lines. Pt ran on BFR 300-350 most of the tx. Dr. Jonnie Finner made aware, received orders to lock HD cath with cathflo at conclusion of tx.

## 2021-04-17 ENCOUNTER — Inpatient Hospital Stay (HOSPITAL_COMMUNITY): Payer: BC Managed Care – PPO

## 2021-04-17 DIAGNOSIS — R609 Edema, unspecified: Secondary | ICD-10-CM | POA: Diagnosis not present

## 2021-04-17 DIAGNOSIS — E1122 Type 2 diabetes mellitus with diabetic chronic kidney disease: Secondary | ICD-10-CM | POA: Diagnosis not present

## 2021-04-17 DIAGNOSIS — J9611 Chronic respiratory failure with hypoxia: Secondary | ICD-10-CM | POA: Diagnosis not present

## 2021-04-17 DIAGNOSIS — N186 End stage renal disease: Secondary | ICD-10-CM | POA: Diagnosis not present

## 2021-04-17 DIAGNOSIS — B9561 Methicillin susceptible Staphylococcus aureus infection as the cause of diseases classified elsewhere: Secondary | ICD-10-CM

## 2021-04-17 DIAGNOSIS — R7881 Bacteremia: Secondary | ICD-10-CM

## 2021-04-17 LAB — BLOOD CULTURE ID PANEL (REFLEXED) - BCID2

## 2021-04-17 IMAGING — DX DG WRIST 2V*L*
2 series · 2 of 2 positions shown · non-contrast
Comparison: None.

CLINICAL DATA: Left wrist pain.

EXAM:
LEFT WRIST - 2 VIEW

[wrist ap]
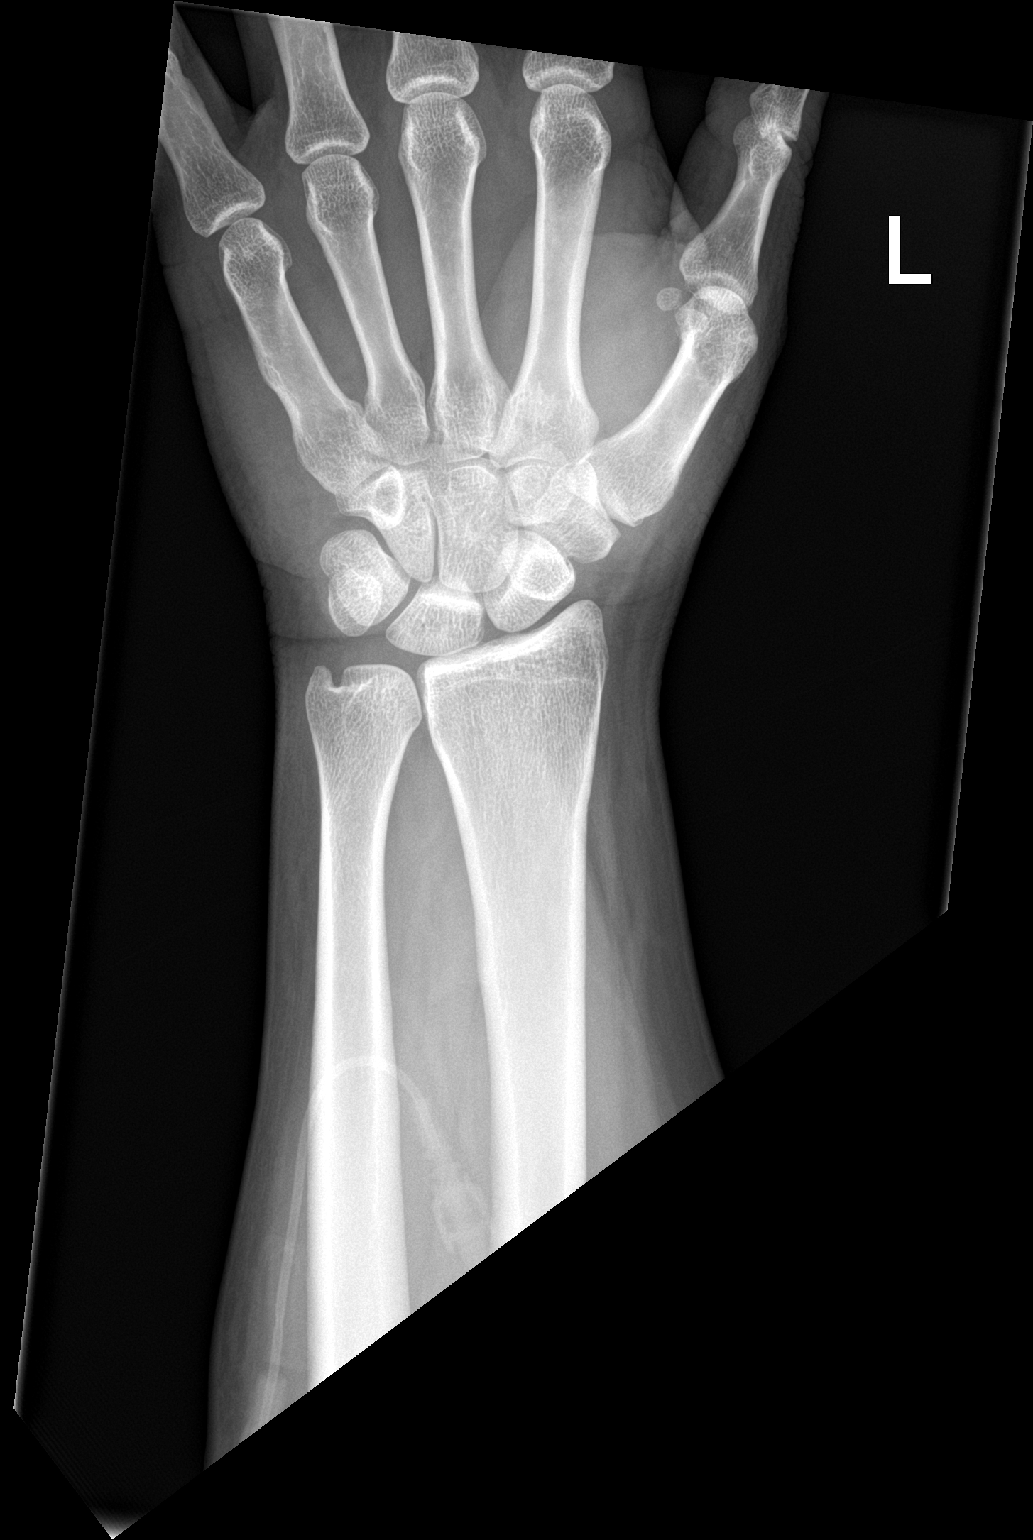

[wrist lat]
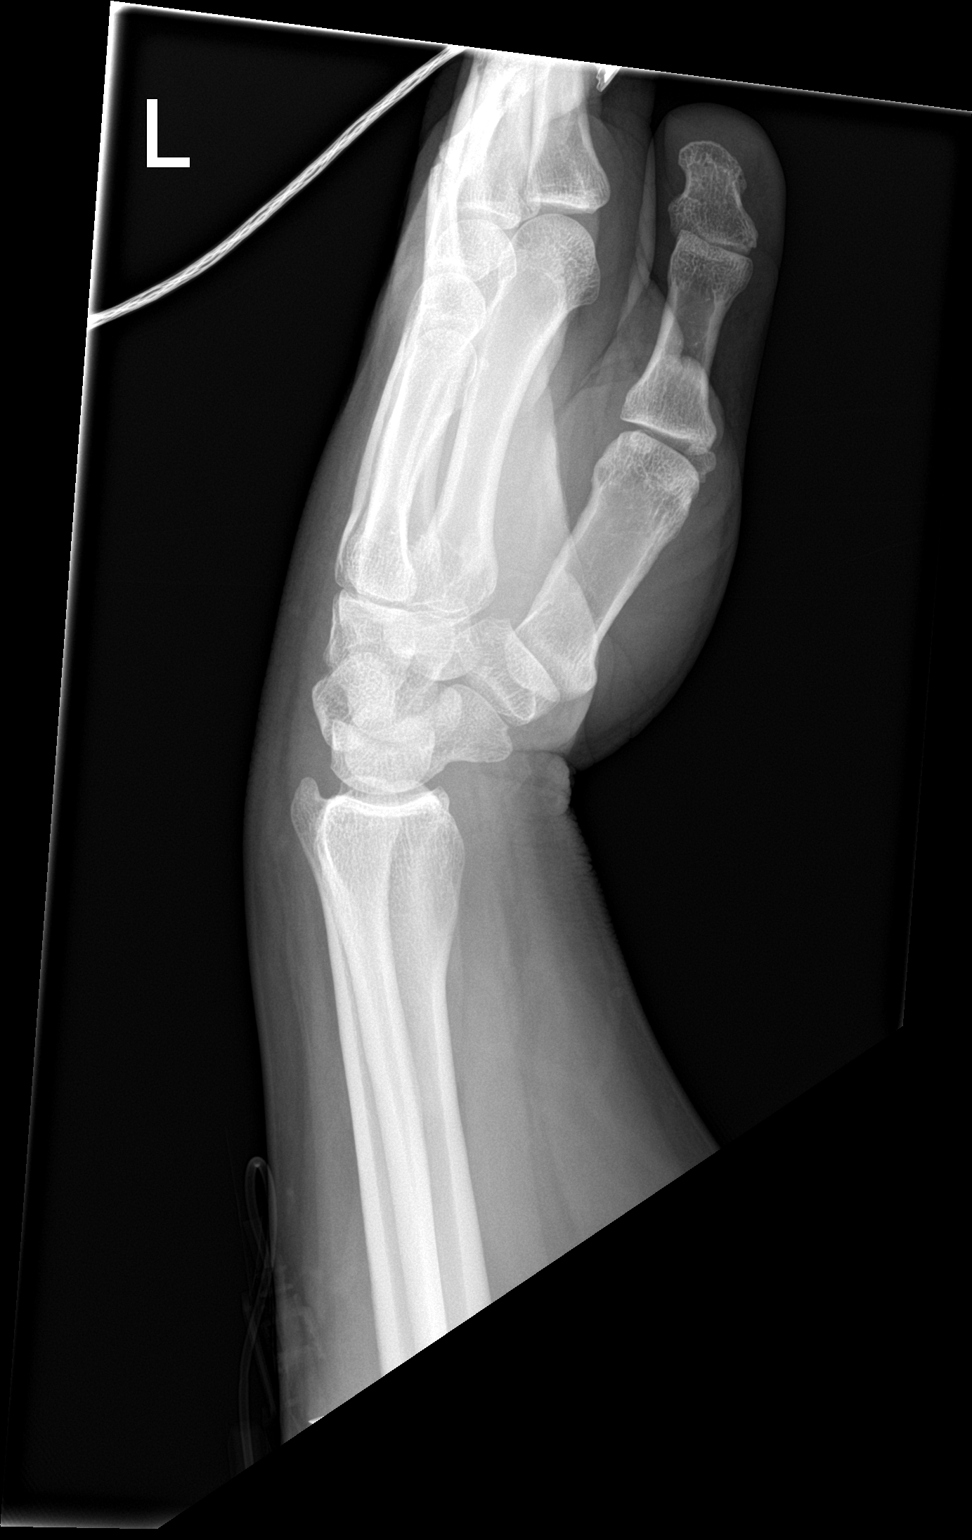

[2 of 2 positions shown; findings below may reference images not displayed]

FINDINGS: There is no evidence of fracture or dislocation. There is no
evidence of arthropathy or other focal bone abnormality. Soft
tissues are unremarkable.
IMPRESSION: Negative.

## 2021-04-17 MED ORDER — CEFAZOLIN SODIUM-DEXTROSE 1-4 GM/50ML-% IV SOLN
1.0000 g | INTRAVENOUS | Status: DC
  Administered 2021-04-17 – 2021-04-20 (×4): 1 g via INTRAVENOUS
  Filled 2021-04-17 (×6): qty 50

## 2021-04-17 NOTE — Progress Notes (Signed)
Asked pt about cpap, he stated he did not want to go on it now. I instructed him to call if he wants to go on it.

## 2021-04-17 NOTE — Progress Notes (Signed)
Occupational Therapy Treatment Patient Details Name: Tagan Roughton MRN: [id number] DOB: August 30, 1985 Today's Date: 04/17/2021   History of present illness     OT comments  Therapist attempted patient multiple time and patient was unable to arouse to particiapate.  Nursing states patient feel asleep during conversation earlier.   Recommendations for follow up therapy are one component of a multi-disciplinary discharge planning process, led by the attending physician.  Recommendations may be updated based on patient status, additional functional criteria and insurance authorization.    Follow Up Recommendations       Equipment Recommendations       Recommendations for Other Services      Precautions / Restrictions         Mobility Bed Mobility                    Transfers                      Balance                                           ADL either performed or assessed with clinical judgement   ADL                                               Vision       Perception     Praxis      Cognition                                                Exercises     Shoulder Instructions       General Comments      Pertinent Vitals/ Pain          Home Living                                          Prior Functioning/Environment              Frequency           Progress Toward Goals  OT Goals(current goals can now be found in the care plan section)        Plan      Co-evaluation                 AM-PAC OT "6 Clicks" Daily Activity     Outcome Measure                    End of Session        Activity Tolerance     Patient Left     Nurse Communication          Time:  -     Charges:    Lodema Hong, OTA   Trixie Dredge 04/17/2021, 3:24 PM

## 2021-04-17 NOTE — Progress Notes (Signed)
Height:  5'11"    Weight: 418 lbs BMI: 58.36  Today's Date: 04/17/21  STOP BANG RISK ASSESSMENT S (snore) Have you been told that you snore?     YES   T (tired) Are you often tired, fatigued, or sleepy during the day?   YES  O (obstruction) Do you stop breathing, choke, or gasp during sleep? YES   P (pressure) Do you have or are you being treated for high blood pressure? YES   B (BMI) Is your body index greater than 35 kg/m? YES   A (age) Are you 50 years old or older? NO   N (neck) Do you have a neck circumference greater than 16 inches?   YES   G (gender) Are you a male? YES   TOTAL STOP/BANG "YES" ANSWERS 7                                                                       For Office Use Only              Procedure Order Form    YES to 3+ Stop Bang questions OR two clinical symptoms - patient qualifies for WatchPAT (CPT 95800)      Clinical Notes: Will consult Sleep Specialist and refer for management of therapy due to patient increased risk of Sleep Apnea. Ordering a sleep study due to the following two clinical symptoms: Excessive daytime sleepiness G47.10 / Loud snoring R06.83

## 2021-04-17 NOTE — Progress Notes (Signed)
PHARMACY - PHYSICIAN COMMUNICATION CRITICAL VALUE ALERT - BLOOD CULTURE IDENTIFICATION (BCID)  Phillip Young is an 35 y.o. male who presented to [facility] on 04/12/2021 with a chief complaint of Shortness of Breath. History of ESRD, on HD  Assessment:   4/4 bottles GPC BCID detected Staphylococcus aureus No resistance detected  Name of physician (or Provider) Contacted: ID team and Dr. Posey Pronto  Current antibiotics:none  Changes to prescribed antibiotics recommended:  Start Cefazolin 1g IV q24h Will adjust for HD dosing when consistent schedule obtained   Results for orders placed or performed during the hospital encounter of 04/12/21  Blood Culture ID Panel (Reflexed) (Collected: 04/16/2021  9:42 PM)  Result Value Ref Range   Enterococcus faecalis NOT DETECTED NOT DETECTED   Enterococcus Faecium NOT DETECTED NOT DETECTED   Listeria monocytogenes NOT DETECTED NOT DETECTED   Staphylococcus species DETECTED (A) NOT DETECTED   Staphylococcus aureus (BCID) DETECTED (A) NOT DETECTED   Staphylococcus epidermidis NOT DETECTED NOT DETECTED   Staphylococcus lugdunensis NOT DETECTED NOT DETECTED   Streptococcus species NOT DETECTED NOT DETECTED   Streptococcus agalactiae NOT DETECTED NOT DETECTED   Streptococcus pneumoniae NOT DETECTED NOT DETECTED   Streptococcus pyogenes NOT DETECTED NOT DETECTED   A.calcoaceticus-baumannii NOT DETECTED NOT DETECTED   Bacteroides fragilis NOT DETECTED NOT DETECTED   Enterobacterales NOT DETECTED NOT DETECTED   Enterobacter cloacae complex NOT DETECTED NOT DETECTED   Escherichia coli NOT DETECTED NOT DETECTED   Klebsiella aerogenes NOT DETECTED NOT DETECTED   Klebsiella oxytoca NOT DETECTED NOT DETECTED   Klebsiella pneumoniae NOT DETECTED NOT DETECTED   Proteus species NOT DETECTED NOT DETECTED   Salmonella species NOT DETECTED NOT DETECTED   Serratia marcescens NOT DETECTED NOT DETECTED   Haemophilus influenzae NOT DETECTED NOT DETECTED    Neisseria meningitidis NOT DETECTED NOT DETECTED   Pseudomonas aeruginosa NOT DETECTED NOT DETECTED   Stenotrophomonas maltophilia NOT DETECTED NOT DETECTED   Candida albicans NOT DETECTED NOT DETECTED   Candida auris NOT DETECTED NOT DETECTED   Candida glabrata NOT DETECTED NOT DETECTED   Candida krusei NOT DETECTED NOT DETECTED   Candida parapsilosis NOT DETECTED NOT DETECTED   Candida tropicalis NOT DETECTED NOT DETECTED   Cryptococcus neoformans/gattii NOT DETECTED NOT DETECTED   Meth resistant mecA/C and MREJ NOT DETECTED NOT [place], PharmD PGY2 Infectious Diseases Pharmacy Resident   Please check [email or web address] for unit-specific pharmacy phone numbers

## 2021-04-17 NOTE — Progress Notes (Signed)
[facility] Progress Note  Patient: Phillip Young    [id number]  DOA: 04/12/2021     Date of Service: the patient was seen and examined on 04/17/2021  Brief hospital course: Past medical history of combined systolic and diastolic biventricular failure, type II DM, PAF on Eliquis, ESRD on HD, recent admission for cardiogenic shock and cardiorenal syndrome.  Presents to the hospital for shortness of breath and joint pains. Found to have MSSA bacteremia. ID, nephrology consulted Currently plan is continue current therapy.  Subjective: Continues to have pain on the left side of his body including left jaw. No nausea no vomiting.  No fever no chills.  No diarrhea. Tells me patient that he drinks under 1200 cc of water per day but then fails to add up his ice intake as well as juice intake.  Assessment and Plan: 1.  MSSA bacteremia 4 out of 4 bottles positive for MSSA.  Blood cultures performed on 9/22. ID consulted. Echocardiogram ordered. On IV cefazolin. Repeat cultures ordered. Will require TEE if TTE is negative for endocarditis. Patient will require a line holiday as well.  Nephrology following.  2.  Left-sided hip and breast pain as well as left jaw pain. Right knee pain. Possibly polyarticular gout although now the patient could be suspected to have polyarticular infection. Patient was treated with prednisone will discontinue. Patient was also given colchicine. Started on allopurinol.  Uric acid level is normal. Continue pain medication. Patient appears to be drowsy during conversation. Unable to fit in the MRI given his size. Will discuss with orthopedic regarding imaging and work-up options.  3.  ESRD on HD Nephrology consult (appreciate assistance. HD catheter will be removed.  4.  Chronic respiratory failure. OSA with morbid obesity. Acute on chronic biventricular heart failure On 4 LPM at home.  Concentrator was malfunctioning therefore high requirement of  increased oxygen on admission. Also on BiPAP at home which was continued. Case management working to ensure oxygen delivery at home. Volume appears to be adequate. On midodrine 20 mg 3 times a day. Not a candidate for ICD per heart failure team. Currently in sinus rhythm on amiodarone. Therapeutic to Eliquis.  5.  Morbid obesity Placing the patient at high risk for poor outcome.  Body mass index is 58.36 kg/m.   6.  Compliance issue. Patient has hard time remaining compliant with his warfarin taper.  Patient also appears to be using multiple supplements which I will be holding given presence of sodium   DVT Prophylaxis:    apixaban (ELIQUIS) tablet 5 mg    Pt is Full code.  Communication: no family was present at bedside, at the time of interview.   Data Reviewed: I have personally reviewed and interpreted daily labs, tele strips, imaging. Hemoglobin stable.  Electrolytes stable.  Physical Exam:  General: Appear in mild distress, no Rash; Oral Mucosa Clear, moist. no Abnormal Neck Mass Or lumps, Conjunctiva normal  Cardiovascular: S1 and S2 Present, no Murmur, Respiratory: good respiratory effort, Bilateral Air entry present and CTA, no Crackles, no wheezes Abdomen: Bowel Sound present, Soft and no tenderness Extremities: Left leg warmth and pedal edema Neurology: alert and oriented to time, place, and person affect appropriate. no new focal deficit Gait not checked due to patient safety concerns    Vitals:   04/17/21 0300 04/17/21 0733 04/17/21 1157 04/17/21 1609  BP: 113/69   121/71  Pulse: 78   70  Resp: 19   19  Temp: 97.9 F (36.6 C) 97.6  F (36.4 C) 97.8 F (36.6 C) 98 F (36.7 C)  TempSrc: Oral Axillary Oral Oral  SpO2: 97%   94%  Weight:      Height:        Disposition:  Status is: Inpatient  Remains inpatient appropriate because:IV treatments appropriate due to intensity of illness or inability to take PO  Dispo: The patient is from: Home               Anticipated d/c is to: to be determined              Patient currently is not medically stable to d/c.   Difficult to place patient No        Time spent: 35 minutes. I reviewed all nursing notes, pharmacy notes, vitals, pertinent old records. I have discussed plan of care as described above with RN.  Author: Berle Mull, MD [facility] 04/17/2021 6:32 PM  To reach On-call, see care teams to locate the attending and reach out via [email or web address]. Between 7PM-7AM, please contact night-coverage If you still have difficulty reaching the attending provider, please page the [facility] (Director on Call) for [facility] on amion for assistance.

## 2021-04-17 NOTE — Progress Notes (Signed)
Pt. Refused bipap. Pt. States he will notify if he changes his mind.

## 2021-04-17 NOTE — Consult Note (Signed)
[facility] for Infectious Disease    Date of Admission:  04/12/2021     Total days of antibiotics 0               Reason for Consult: MSSA Bacteremia   Referring Provider: Tivis Ringer / [employer] Primary Care Provider: [name], MD   ASSESSMENT:  [name] is a 35 y/o AA male on dialysis for ESRD and CHF admitted with shortness of breath, fatigue, and right knee, left hip and left wrist pain found to have MSSA bacteremia. Joint pain initially believed to be possible gout flare. Now with MSSA bacteremia have to be concerned about potential for infection. HD cathter may also be possible source. Will need line holiday due to bacteremia and will coordinate with Nephrology. Repeat blood cultures tomorrow. TTE to check for endocarditis. Given his current respiratory status I am not sure he would be a candidate for TEE.  May need to consider further imaging of right knee, left wrist and left hip or possible orthopedics consult to ensure he does not have any septic joints. Continue current dose of Cefazolin. Respiratory and remaining supportive care per primary team and Nephrology.   PLAN:  Continue Cefazolin. Recheck blood cultures tomorrow TTE to check for endocarditis Line holiday per Nephrology. Consider additional imaging of painful joint / orthopedic consult.  Remaining supportive care per primary team and Nephrology.    Principal Problem:   Chronic respiratory failure with hypoxia ([name]) Active Problems:   Morbid obesity with body mass index (BMI) of 60.0 to 69.9 in adult [facility])   Chronic systolic CHF (congestive heart failure) ([name])   Type 2 diabetes mellitus with diabetic chronic kidney disease ([name])   ESRD (end stage renal disease) ([name])   Paroxysmal atrial flutter ([name])   Hyponatremia   ESRD (end stage renal disease) on dialysis ([name])    allopurinol  100 mg Oral Daily   amiodarone  200 mg Oral Daily   apixaban  5 mg Oral BID   Chlorhexidine Gluconate  Cloth  6 each Topical Daily   Chlorhexidine Gluconate Cloth  6 each Topical Q0600   lanthanum  500 mg Oral TID WC   midodrine  20 mg Oral TID WC     HPI: Phillip Young is a 35 y.o. male with previous medical history of ESRD on dialysis, morbid obesity, hypertension, sleep apnea and Type 2 diabetes admitted with shortness of breath, orthopnea, fatigue and nausea/vomiting.   [name] has had worsening shortness of breath starting 5-6 days prior to presentation. Baseline on 1 L [place] at home and was hypoxic at 78%. Chest x-ray with cardiomegaly with pulmonary vascular congestion. Right knee x-ray obtained secondary to pain with no significant findings. Admitted with concern for fluid overload. In addition to right knee and left wrist pain now with acute left hip pain. CT right hip with no osseous abnormalities. Initially treated with concern for gout. Blood cultures drawn on 04/16/21 are positive for MSSA bacteremia.   [name] has been afebrile since admission and now has leukocytosis of 15.3. Has been started on Cefazolin. Continues to have right knee, left wrist and left hip pain. No trauma or injury. Has a temporary dialysis catheter in his upper right chest that was inserted about 1 month ago when he began dialysis.     Review of Systems: Review of Systems  Constitutional:  Positive for malaise/fatigue. Negative for chills, fever and weight loss.  Respiratory:  Negative for cough, shortness of breath  and wheezing.   Cardiovascular:  Negative for chest pain and leg swelling.  Gastrointestinal:  Positive for nausea. Negative for abdominal pain, constipation, diarrhea and vomiting.  Musculoskeletal:        Joint pain in right knee, left wrist and left hip  Skin:  Negative for rash.    Past Medical History:  Diagnosis Date   Biventricular congestive heart failure ([place])    Last Echo 11/2019 at [facility] reveals EF 20%   Class 3 severe obesity due to excess calories with serious  comorbidity and body mass index (BMI) of 50.0 to 59.9 in adult [facility]) 02/26/2020   Essential hypertension 02/26/2020   GERD without esophagitis 02/26/2020   Hidradenitis suppurativa 02/26/2020   OSA (obstructive sleep apnea) 02/26/2020   Prediabetes 02/26/2020   Renal disorder     Social History   Tobacco Use   Smoking status: Former    Packs/day: 1.00    Types: Cigarettes    Quit date: 2019    Years since quitting: 3.7   Smokeless tobacco: Former   Tobacco comments:    quit in 2019  Substance Use Topics   Alcohol use: Never   Drug use: Never    Family History  Problem Relation Age of Onset   Heart disease Mother    Hypertension Mother    Pulmonary Hypertension Mother    Drug abuse Father        died due to Heroin overdose    Allergies  Allergen Reactions   Coreg [Carvedilol] Shortness Of Breath and Diarrhea    Wheezing    Heparin Other (See Comments)    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 rate 19, height '5\' 11"'$  (1.803 m), weight (!) 189.8 kg, SpO2 97 %.  Physical Exam Constitutional:      General: He is not in acute distress.    Appearance: He is well-developed. He is obese. He is ill-appearing.     Comments: Lying in bed with head of bed elevated.  Cardiovascular:     Rate and Rhythm: Normal rate and regular rhythm.     Heart sounds: Normal heart sounds.     Comments: Dialysis catheter in right upper chest; clean and dry Pulmonary:     Effort: Pulmonary effort is normal.     Breath sounds: Normal breath sounds.  Musculoskeletal:     Comments: Generalized tenderness of right knee and left hip.   Skin:    General: Skin is warm and dry.  Neurological:     Mental Status: He is alert and oriented to person, place, and time.  Psychiatric:        Mood and Affect: Mood normal.    Lab Results Lab Results  Component Value Date   WBC 15.3  (H) 04/16/2021   HGB 8.4 (L) 04/16/2021   HCT 27.7 (L) 04/16/2021   MCV 101.8 (H) 04/16/2021   PLT 319 04/16/2021    Lab Results  Component Value Date   CREATININE 7.87 (H) 04/16/2021   BUN 65 (H) 04/16/2021   NA 127 (L) 04/16/2021   K 4.6 04/16/2021   CL 87 (L) 04/16/2021   CO2 25 04/16/2021    Lab Results  Component Value Date   ALT 32 04/12/2021   AST 36 04/12/2021   ALKPHOS 84 04/12/2021   BILITOT 1.2 04/12/2021     Microbiology: Recent Results (from the past 240 hour(s))  Resp Panel by RT-PCR (Flu A&B, Covid) Nasopharyngeal Swab     Status: None   Collection Time: 04/12/21  8:51 PM   Specimen: Nasopharyngeal Swab; Nasopharyngeal(NP) swabs in vial transport medium  Result Value Ref Range Status   SARS Coronavirus 2 by RT PCR NEGATIVE NEGATIVE Final    Comment: (NOTE) SARS-CoV-2 target nucleic acids are NOT DETECTED.  The SARS-CoV-2 RNA is generally detectable in upper respiratory specimens during the acute phase of infection. The lowest concentration of SARS-CoV-2 viral copies this assay can detect is 138 copies/mL. A negative result does not preclude SARS-Cov-2 infection and should not be used as the sole basis for treatment or other patient management decisions. A negative result may occur with  improper specimen collection/handling, submission of specimen other than nasopharyngeal swab, presence of viral mutation(s) within the areas targeted by this assay, and inadequate number of viral copies(<138 copies/mL). A negative result must be combined with clinical observations, patient history, and epidemiological information. The expected result is Negative.  Fact Sheet for Patients:  EntrepreneurPulse.com.au  Fact Sheet for Healthcare Providers:  IncredibleEmployment.be  This test is no t yet approved or cleared by the [place] FDA and  has been authorized for detection and/or diagnosis of SARS-CoV-2 by FDA under an  Emergency Use Authorization (EUA). This EUA will remain  in effect (meaning this test can be used) for the duration of the COVID-19 declaration under Section 564(b)(1) of the Act, 21 U.S.C.section 360bbb-3(b)(1), unless the authorization is terminated  or revoked sooner.       Influenza A by PCR NEGATIVE NEGATIVE Final   Influenza B by PCR NEGATIVE NEGATIVE Final    Comment: (NOTE) The Xpert Xpress SARS-CoV-2/FLU/RSV plus assay is intended as an aid in the diagnosis of influenza from Nasopharyngeal swab specimens and should not be used as a sole basis for treatment. Nasal washings and aspirates are unacceptable for Xpert Xpress SARS-CoV-2/FLU/RSV testing.  Fact Sheet for Patients: EntrepreneurPulse.com.au  Fact Sheet for Healthcare Providers: IncredibleEmployment.be  This test is not yet approved or cleared by the [place] FDA and has been authorized for detection and/or diagnosis of SARS-CoV-2 by FDA under an Emergency Use Authorization (EUA). This EUA will remain in effect (meaning this test can be used) for the duration of the COVID-19 declaration under Section 564(b)(1) of the Act, 21 U.S.C. section 360bbb-3(b)(1), unless the authorization is terminated or revoked.  Performed at [facility], [place] [address], [place], [place] [address]   MRSA Next Gen by PCR, Nasal     Status: None   Collection Time: 04/13/21  1:02 AM   Specimen: Nasal Mucosa; Nasal Swab  Result Value Ref Range Status   MRSA by PCR Next Gen NOT DETECTED NOT DETECTED Final    Comment: (NOTE) The GeneXpert MRSA Assay (FDA approved for NASAL specimens only), is one component of a comprehensive MRSA colonization surveillance program. It is not intended to diagnose MRSA infection nor to guide or monitor treatment for MRSA infections. Test performance is not FDA approved in patients less than 28 years old. Performed at [facility], [place] [address]  [place], [place] [address]   Culture, blood (Routine X 2) w Reflex to ID Panel     Status: None (Preliminary result)   Collection Time: 04/16/21  9:42 PM   Specimen: BLOOD  Result Value Ref Range Status   Specimen Description BLOOD BLOOD LEFT ARM  Final   Special Requests   Final    BOTTLES DRAWN  AEROBIC AND ANAEROBIC Blood Culture adequate volume   Culture  Setup Time   Final    GRAM POSITIVE COCCI IN BOTH AEROBIC AND ANAEROBIC BOTTLES CRITICAL RESULT CALLED TO, READ BACK BY AND VERIFIED WITH: PHARMD A.LOWLES AT 1121 ON 04/17/2021 BY T.SAAD. Performed at [facility], [place] [address], [place], [place] [address]    Culture GRAM POSITIVE COCCI  Final   Report Status PENDING  Incomplete  Culture, blood (Routine X 2) w Reflex to ID Panel     Status: None (Preliminary result)   Collection Time: 04/16/21  9:42 PM   Specimen: BLOOD  Result Value Ref Range Status   Specimen Description BLOOD BLOOD RIGHT ARM  Final   Special Requests   Final    BOTTLES DRAWN AEROBIC AND ANAEROBIC Blood Culture results may not be optimal due to an inadequate volume of blood received in culture bottles   Culture  Setup Time   Final    GRAM POSITIVE COCCI IN BOTH AEROBIC AND ANAEROBIC BOTTLES CRITICAL VALUE NOTED.  VALUE IS CONSISTENT WITH PREVIOUSLY REPORTED AND CALLED VALUE. Performed at [facility], [place] [address], [place], [place] [address]    Culture GRAM POSITIVE COCCI  Final   Report Status PENDING  Incomplete  Blood Culture ID Panel (Reflexed)     Status: Abnormal   Collection Time: 04/16/21  9:42 PM  Result Value Ref Range Status   Enterococcus faecalis NOT DETECTED NOT DETECTED Final   Enterococcus Faecium NOT DETECTED NOT DETECTED Final   Listeria monocytogenes NOT DETECTED NOT DETECTED Final   Staphylococcus species DETECTED (A) NOT DETECTED Final    Comment: CRITICAL RESULT CALLED TO, READ BACK BY AND VERIFIED WITH: PHARMD A.LOWLES AT 1121 ON 04/17/2021 BY T.SAAD.    Staphylococcus  aureus (BCID) DETECTED (A) NOT DETECTED Final    Comment: CRITICAL RESULT CALLED TO, READ BACK BY AND VERIFIED WITH: PHARMD A.LOWLES AT 1121 ON 04/17/2021 BY T.SAAD.    Staphylococcus epidermidis NOT DETECTED NOT DETECTED Final   Staphylococcus lugdunensis NOT DETECTED NOT DETECTED Final   Streptococcus species NOT DETECTED NOT DETECTED Final   Streptococcus agalactiae NOT DETECTED NOT DETECTED Final   Streptococcus pneumoniae NOT DETECTED NOT DETECTED Final   Streptococcus pyogenes NOT DETECTED NOT DETECTED Final   A.calcoaceticus-baumannii NOT DETECTED NOT DETECTED Final   Bacteroides fragilis NOT DETECTED NOT DETECTED Final   Enterobacterales NOT DETECTED NOT DETECTED Final   Enterobacter cloacae complex NOT DETECTED NOT DETECTED Final   Escherichia coli NOT DETECTED NOT DETECTED Final   Klebsiella aerogenes NOT DETECTED NOT DETECTED Final   Klebsiella oxytoca NOT DETECTED NOT DETECTED Final   Klebsiella pneumoniae NOT DETECTED NOT DETECTED Final   Proteus species NOT DETECTED NOT DETECTED Final   Salmonella species NOT DETECTED NOT DETECTED Final   Serratia marcescens NOT DETECTED NOT DETECTED Final   Haemophilus influenzae NOT DETECTED NOT DETECTED Final   Neisseria meningitidis NOT DETECTED NOT DETECTED Final   Pseudomonas aeruginosa NOT DETECTED NOT DETECTED Final   Stenotrophomonas maltophilia NOT DETECTED NOT DETECTED Final   Candida albicans NOT DETECTED NOT DETECTED Final   Candida auris NOT DETECTED NOT DETECTED Final   Candida glabrata NOT DETECTED NOT DETECTED Final   Candida krusei NOT DETECTED NOT DETECTED Final   Candida parapsilosis NOT DETECTED NOT DETECTED Final   Candida tropicalis NOT DETECTED NOT DETECTED Final   Cryptococcus neoformans/gattii NOT DETECTED NOT DETECTED Final   Meth resistant mecA/C and MREJ NOT DETECTED NOT DETECTED Final  Comment: Performed at [facility], [place] [address], [place], [place] [address]     Terri Piedra, [place] for Infectious Disease [facility]  04/17/2021  12:50 PM

## 2021-04-17 NOTE — Progress Notes (Signed)
Patient requested to be assisted to side of bed in order to eat meal. RN and NT arrived to the room together to offer assistance. Patient then declined assistance, stating that he had changed his mind. RN asked patient what will he do with the meal and he stated " I just won't eat".

## 2021-04-17 NOTE — Progress Notes (Signed)
[facility] Progress Note  Subjective: seen in room. Main c/o's are pain and fluid restriction  Vitals:   04/16/21 2004 04/16/21 2328 04/17/21 0300 04/17/21 0733  BP: 110/67 118/66 113/69   Pulse: 83 77 78   Resp: '20 19 19   '$ Temp: 97.7 F (36.5 C) 98.1 F (36.7 C) 97.9 F (36.6 C) 97.6 F (36.4 C)  TempSrc: Oral Oral Oral Axillary  SpO2: 94% 98% 97%   Weight:      Height:        Exam:  alert, nad , obese, nasal O2  Lying flat, no ^wob  no jvd  Chest cta bilat  Cor reg no RG  Abd soft ntnd no ascites   Ext no LE edema   Alert, NF O x3  RIJ TDC      Home meds include - eliquis 5 bid, amiodarone, lanthanum 500 mg ac tid, midodrine '20mg'$  tid      OP HD: TTS/ AKI [facility]    4h  400/800    188.5kg   2/2 bath  TDC RIJ  Hep none   - no in center meds     Assessment/ Plan: SOB - likely that his O2 equipment ran out or malfunctioned. SOB resolved w/ O2 supplied here. Did not look vol overloaded on admission.  Volume - 3.5 L off w/ HD yest, came off +1kg.  ESRD - on HD TTS. HD Sat Bivent CHF - severe Hypotension - on midodrine at high dose 20 tid R knee/ L wrist pain - started on prednisone for suspected gout Atrial fib - on amio/ eliquis MBD ckd - cont binder Anemia ckd - Hb 9.9 here, stable       Rob Lilith Solana 04/17/2021, 11:00 AM   Recent Labs  Lab 04/14/21 0831 04/16/21 1526  K 3.8 4.6  BUN 75* 65*  CREATININE 9.96* 7.87*  CALCIUM 8.4* 8.7*  PHOS 8.7* 8.8*  HGB 8.9* 8.4*    Inpatient medications:  allopurinol  100 mg Oral Daily   amiodarone  200 mg Oral Daily   apixaban  5 mg Oral BID   Chlorhexidine Gluconate Cloth  6 each Topical Daily   Chlorhexidine Gluconate Cloth  6 each Topical Q0600   lanthanum  500 mg Oral TID WC   midodrine  20 mg Oral TID WC    anticoagulant sodium citrate     acetaminophen **OR** acetaminophen, anticoagulant sodium citrate, cyclobenzaprine, ipratropium-albuterol, ondansetron **OR** ondansetron  (ZOFRAN) IV, oxyCODONE-acetaminophen

## 2021-04-17 NOTE — Addendum Note (Signed)
Encounter addended by: Scarlette Calico, RN on: 04/17/2021 12:36 PM  Actions taken: Clinical Note Signed, Visit diagnoses modified, Order list changed, Diagnosis association updated

## 2021-04-17 NOTE — Progress Notes (Signed)
Lower extremity venous has been completed.   Preliminary results in CV Proc.   Phillip Young 04/17/2021 3:23 PM

## 2021-04-18 ENCOUNTER — Inpatient Hospital Stay (HOSPITAL_COMMUNITY): Payer: BC Managed Care – PPO

## 2021-04-18 DIAGNOSIS — N186 End stage renal disease: Secondary | ICD-10-CM | POA: Diagnosis not present

## 2021-04-18 DIAGNOSIS — Z992 Dependence on renal dialysis: Secondary | ICD-10-CM | POA: Diagnosis not present

## 2021-04-18 DIAGNOSIS — R7881 Bacteremia: Secondary | ICD-10-CM | POA: Diagnosis not present

## 2021-04-18 DIAGNOSIS — J9611 Chronic respiratory failure with hypoxia: Secondary | ICD-10-CM | POA: Diagnosis not present

## 2021-04-18 LAB — RENAL FUNCTION PANEL
Albumin: 2.8 g/dL — ABNORMAL LOW (ref 3.5–5.0)
Anion gap: 13 (ref 5–15)
BUN: 61 mg/dL — ABNORMAL HIGH (ref 6–20)
CO2: 25 mmol/L (ref 22–32)
Calcium: 9.9 mg/dL (ref 8.9–10.3)
Chloride: 90 mmol/L — ABNORMAL LOW (ref 98–111)
Creatinine, Ser: 5.72 mg/dL — ABNORMAL HIGH (ref 0.61–1.24)
GFR, Estimated: 12 mL/min — ABNORMAL LOW (ref >60)
Glucose, Bld: 126 mg/dL — ABNORMAL HIGH (ref 70–99)
Phosphorus: 8.4 mg/dL — ABNORMAL HIGH (ref 2.5–4.6)
Potassium: 6.2 mmol/L — ABNORMAL HIGH (ref 3.5–5.1)
Sodium: 128 mmol/L — ABNORMAL LOW (ref 135–145)

## 2021-04-18 LAB — CBC WITH DIFFERENTIAL/PLATELET
Abs Immature Granulocytes: 0.22 10*3/uL — ABNORMAL HIGH (ref 0.00–0.07)
Basophils Absolute: 0 10*3/uL (ref 0.0–0.1)
Basophils Relative: 0 %
Eosinophils Absolute: 0 10*3/uL (ref 0.0–0.5)
Eosinophils Relative: 0 %
HCT: 28.2 % — ABNORMAL LOW (ref 39.0–52.0)
Hemoglobin: 8.9 g/dL — ABNORMAL LOW (ref 13.0–17.0)
Immature Granulocytes: 1 %
Lymphocytes Relative: 4 %
Lymphs Abs: 0.9 10*3/uL (ref 0.7–4.0)
MCH: 31.8 pg (ref 26.0–34.0)
MCHC: 31.6 g/dL (ref 30.0–36.0)
MCV: 100.7 fL — ABNORMAL HIGH (ref 80.0–100.0)
Monocytes Absolute: 1.7 10*3/uL — ABNORMAL HIGH (ref 0.1–1.0)
Monocytes Relative: 9 %
Neutro Abs: 17.1 10*3/uL — ABNORMAL HIGH (ref 1.7–7.7)
Neutrophils Relative %: 86 %
Platelets: 351 10*3/uL (ref 150–400)
RBC: 2.8 MIL/uL — ABNORMAL LOW (ref 4.22–5.81)
RDW: 19.5 % — ABNORMAL HIGH (ref 11.5–15.5)
WBC: 19.9 10*3/uL — ABNORMAL HIGH (ref 4.0–10.5)
nRBC: 0.2 % (ref 0.0–0.2)

## 2021-04-18 LAB — ECHOCARDIOGRAM LIMITED
Area-P 1/2: 5.27 cm2
Height: 71 in
S' Lateral: 6.1 cm
Weight: 6694.93 oz

## 2021-04-18 NOTE — Progress Notes (Signed)
[facility] Progress Note  Subjective: seen in room. Very upset about renal fluid restriction. Appreciate IR doing TDC removal this am.  Note purulent fluid at exit site when removed.   Vitals:   04/18/21 0741 04/18/21 1149 04/18/21 1200 04/18/21 1300  BP: 135/66 (!) 81/60 (!) 107/54 103/80  Pulse: 84 70 69 77  Resp: 18 20 (!) 22 20  Temp: 98.1 F (36.7 C) 98 F (36.7 C)    TempSrc: Oral Oral    SpO2: 94% 96% 94% 97%  Weight:      Height:        Exam:  alert, nad , obese, nasal O2  Lying flat, no ^wob  no jvd  Chest cta bilat  Cor reg no RG  Abd soft ntnd no ascites   Ext no LE edema   Alert, NF O x3  RIJ TDC      Home meds include - eliquis 5 bid, amiodarone, lanthanum 500 mg ac tid, midodrine '20mg'$  tid      OP HD: TTS/ AKI [facility]    4h  400/800    188.5kg   2/2 bath  TDC RIJ  Hep none   - no in center meds     Assessment/ Plan: Staph aureus bacteremia - HD cath related infection. Exit situ purulent at [facility] removal this am.  Getting IV abx, will have new HD placed on Tuesday.  Fatigue/ multi-joint pain - possibly due to #1. Follow clinically w/ rx of infection.   SOB - likely that his O2 equipment ran out or malfunctioned. SOB resolved w/ O2 supplied here.  ESRD - on HD TTS. Has HD early today. Next HD Tuesday next week Volume - 3.5 L off w/ last HD, came off +1kg.  Bivent CHF - severe Hypotension - on midodrine at high dose 20 tid R knee/ L wrist pain - started on prednisone for suspected gout Atrial fib - on amio/ eliquis MBD ckd - cont binder Anemia ckd - Hb 9.9 here, stable       Rob Kearstin Learn 04/18/2021, 2:16 PM   Recent Labs  Lab 04/16/21 1526 04/18/21 0030  K 4.6 6.2*  BUN 65* 61*  CREATININE 7.87* 5.72*  CALCIUM 8.7* 9.9  PHOS 8.8* 8.4*  HGB 8.4* 8.9*    Inpatient medications:  allopurinol  100 mg Oral Daily   amiodarone  200 mg Oral Daily   apixaban  5 mg Oral BID   Chlorhexidine Gluconate Cloth  6 each Topical  Daily   Chlorhexidine Gluconate Cloth  6 each Topical Q0600   lanthanum  500 mg Oral TID WC   midodrine  20 mg Oral TID WC    anticoagulant sodium citrate      ceFAZolin (ANCEF) IV Stopped (04/17/21 1418)   acetaminophen **OR** acetaminophen, anticoagulant sodium citrate, cyclobenzaprine, ipratropium-albuterol, ondansetron **OR** ondansetron (ZOFRAN) IV, oxyCODONE-acetaminophen

## 2021-04-18 NOTE — Progress Notes (Signed)
[facility]  Date of Admission:  04/12/2021      Lines: Old HD cath removed 9/24 right chest  Abx: 9/23-c cefazolin  ASSESSMENT: Mssa bacteremia Esrd on iHD via previously right chest hd catheter Left hip pain Right knee pain Left wrist pain   Wbc rising a bit today; no fever  Not able to fit into the mri scan for pelvis mri. Left wrist/right knee don't appear to have septic joint picture on exam will defer dedicated imaging for now  Tte/repeat bcx in process  On line holiday  PLAN: Continue cefazolin 1 gram daily; if persistent bacteremia can consider increasing given his obesity  Tte Pelvis ct with contrast F/u repeat bcx   I spent more than 35 minute reviewing data/chart, and coordinating care and >50% direct face to face time providing counseling/discussing diagnostics/treatment plan with patient   Principal Problem:   Chronic respiratory failure with hypoxia ([place]) Active Problems:   Morbid obesity ([name])   Chronic systolic CHF (congestive heart failure) ([name])   Type 2 diabetes mellitus with diabetic chronic kidney disease ([name])   ESRD (end stage renal disease) ([name])   Paroxysmal atrial flutter ([name])   Hyponatremia   ESRD (end stage renal disease) on dialysis ([name])   MSSA bacteremia   Allergies  Allergen Reactions   Coreg [Carvedilol] Shortness Of Breath and Diarrhea    Wheezing    Heparin Other (See Comments)    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, SRA positive    Scheduled Meds:  allopurinol  100 mg Oral Daily   amiodarone  200 mg Oral Daily   apixaban  5 mg Oral BID   Chlorhexidine Gluconate Cloth  6 each Topical Daily   Chlorhexidine Gluconate Cloth  6 each Topical Q0600   lanthanum  500 mg Oral TID WC   midodrine  20 mg Oral TID WC   Continuous Infusions:  anticoagulant sodium citrate      ceFAZolin (ANCEF) IV Stopped (04/17/21 1418)   PRN Meds:.acetaminophen **OR** acetaminophen, anticoagulant sodium  citrate, cyclobenzaprine, ipratropium-albuterol, ondansetron **OR** ondansetron (ZOFRAN) IV, oxyCODONE-acetaminophen   SUBJECTIVE: No back pain Constipation; getting pain meds No f/c Wbc up Complains of left pelvis pain still Mri unable to be done due to size of patient No other new focal pain  Review of Systems: ROS All other ROS was negative, except mentioned above     OBJECTIVE: Vitals:   04/18/21 0741 04/18/21 1149 04/18/21 1200 04/18/21 1300  BP: 135/66 (!) 81/60 (!) 107/54 103/80  Pulse: 84 70 69 77  Resp: 18 20 (!) 22 20  Temp: 98.1 F (36.7 C) 98 F (36.7 C)    TempSrc: Oral Oral    SpO2: 94% 96% 94% 97%  Weight:      Height:       Body mass index is 58.36 kg/m.  Physical Exam General/constitutional: no distress, pleasant; morbidly obese HEENT: Normocephalic, PER, Conj Clear, EOMI, Oropharynx clear Neck supple CV: rrr no mrg Lungs: clear to auscultation, normal respiratory effort Abd: Soft, Nontender Ext: no edema Skin: No Rash Neuro: nonfocal MSK: no knee/elbow/wrist swelling, and with good active rom in those joints; left groin tender on lle rom but still decent ROM    Central line presence: right chest hd cath removed; no fluctuance   Lab Results Lab Results  Component Value Date   WBC 19.9 (H) 04/18/2021   HGB 8.9 (L) 04/18/2021   HCT 28.2 (L) 04/18/2021  MCV 100.7 (H) 04/18/2021   PLT 351 04/18/2021    Lab Results  Component Value Date   CREATININE 5.72 (H) 04/18/2021   BUN 61 (H) 04/18/2021   NA 128 (L) 04/18/2021   K 6.2 (H) 04/18/2021   CL 90 (L) 04/18/2021   CO2 25 04/18/2021    Lab Results  Component Value Date   ALT 32 04/12/2021   AST 36 04/12/2021   ALKPHOS 84 04/12/2021   BILITOT 1.2 04/12/2021      Microbiology: Recent Results (from the past 240 hour(s))  Resp Panel by RT-PCR (Flu A&B, Covid) Nasopharyngeal Swab     Status: None   Collection Time: 04/12/21  8:51 PM   Specimen: Nasopharyngeal Swab;  Nasopharyngeal(NP) swabs in vial transport medium  Result Value Ref Range Status   SARS Coronavirus 2 by RT PCR NEGATIVE NEGATIVE Final    Comment: (NOTE) SARS-CoV-2 target nucleic acids are NOT DETECTED.  The SARS-CoV-2 RNA is generally detectable in upper respiratory specimens during the acute phase of infection. The lowest concentration of SARS-CoV-2 viral copies this assay can detect is 138 copies/mL. A negative result does not preclude SARS-Cov-2 infection and should not be used as the sole basis for treatment or other patient management decisions. A negative result may occur with  improper specimen collection/handling, submission of specimen other than nasopharyngeal swab, presence of viral mutation(s) within the areas targeted by this assay, and inadequate number of viral copies(<138 copies/mL). A negative result must be combined with clinical observations, patient history, and epidemiological information. The expected result is Negative.  Fact Sheet for Patients:  EntrepreneurPulse.com.au  Fact Sheet for Healthcare Providers:  IncredibleEmployment.be  This test is no t yet approved or cleared by the [place] FDA and  has been authorized for detection and/or diagnosis of SARS-CoV-2 by FDA under an Emergency Use Authorization (EUA). This EUA will remain  in effect (meaning this test can be used) for the duration of the COVID-19 declaration under Section 564(b)(1) of the Act, 21 U.S.C.section 360bbb-3(b)(1), unless the authorization is terminated  or revoked sooner.       Influenza A by PCR NEGATIVE NEGATIVE Final   Influenza B by PCR NEGATIVE NEGATIVE Final    Comment: (NOTE) The Xpert Xpress SARS-CoV-2/FLU/RSV plus assay is intended as an aid in the diagnosis of influenza from Nasopharyngeal swab specimens and should not be used as a sole basis for treatment. Nasal washings and aspirates are unacceptable for Xpert Xpress  SARS-CoV-2/FLU/RSV testing.  Fact Sheet for Patients: EntrepreneurPulse.com.au  Fact Sheet for Healthcare Providers: IncredibleEmployment.be  This test is not yet approved or cleared by the [place] FDA and has been authorized for detection and/or diagnosis of SARS-CoV-2 by FDA under an Emergency Use Authorization (EUA). This EUA will remain in effect (meaning this test can be used) for the duration of the COVID-19 declaration under Section 564(b)(1) of the Act, 21 U.S.C. section 360bbb-3(b)(1), unless the authorization is terminated or revoked.  Performed at [facility], [place] [address], [place], [place] [address]   MRSA Next Gen by PCR, Nasal     Status: None   Collection Time: 04/13/21  1:02 AM   Specimen: Nasal Mucosa; Nasal Swab  Result Value Ref Range Status   MRSA by PCR Next Gen NOT DETECTED NOT DETECTED Final    Comment: (NOTE) The GeneXpert MRSA Assay (FDA approved for NASAL specimens only), is one component of a comprehensive MRSA colonization surveillance program. It is not intended to diagnose MRSA  infection nor to guide or monitor treatment for MRSA infections. Test performance is not FDA approved in patients less than 17 years old. Performed at [facility], [place] [address] [place], [place] [address]   Culture, blood (Routine X 2) w Reflex to ID Panel     Status: Abnormal (Preliminary result)   Collection Time: 04/16/21  9:42 PM   Specimen: BLOOD  Result Value Ref Range Status   Specimen Description BLOOD BLOOD LEFT ARM  Final   Special Requests   Final    BOTTLES DRAWN AEROBIC AND ANAEROBIC Blood Culture adequate volume   Culture  Setup Time   Final    GRAM POSITIVE COCCI IN BOTH AEROBIC AND ANAEROBIC BOTTLES CRITICAL RESULT CALLED TO, READ BACK BY AND VERIFIED WITH: PHARMD A.LOWLES AT [id number] ON 04/17/2021 BY T.SAAD.    Culture (A)  Final    STAPHYLOCOCCUS AUREUS SUSCEPTIBILITIES TO FOLLOW Performed at  [facility], [place] [address] [place], [place] [address]    Report Status PENDING  Incomplete  Culture, blood (Routine X 2) w Reflex to ID Panel     Status: Abnormal (Preliminary result)   Collection Time: 04/16/21  9:42 PM   Specimen: BLOOD  Result Value Ref Range Status   Specimen Description BLOOD BLOOD RIGHT ARM  Final   Special Requests   Final    BOTTLES DRAWN AEROBIC AND ANAEROBIC Blood Culture results may not be optimal due to an inadequate volume of blood received in culture bottles   Culture  Setup Time   Final    GRAM POSITIVE COCCI IN BOTH AEROBIC AND ANAEROBIC BOTTLES CRITICAL VALUE NOTED.  VALUE IS CONSISTENT WITH PREVIOUSLY REPORTED AND CALLED VALUE. Performed at [facility], [place] [address], [place], [place] [address]    Culture STAPHYLOCOCCUS AUREUS (A)  Final   Report Status PENDING  Incomplete  Blood Culture ID Panel (Reflexed)     Status: Abnormal   Collection Time: 04/16/21  9:42 PM  Result Value Ref Range Status   Enterococcus faecalis NOT DETECTED NOT DETECTED Final   Enterococcus Faecium NOT DETECTED NOT DETECTED Final   Listeria monocytogenes NOT DETECTED NOT DETECTED Final   Staphylococcus species DETECTED (A) NOT DETECTED Final    Comment: CRITICAL RESULT CALLED TO, READ BACK BY AND VERIFIED WITH: PHARMD A.LOWLES AT 1121 ON 04/17/2021 BY T.SAAD.    Staphylococcus aureus (BCID) DETECTED (A) NOT DETECTED Final    Comment: CRITICAL RESULT CALLED TO, READ BACK BY AND VERIFIED WITH: PHARMD A.LOWLES AT 1121 ON 04/17/2021 BY T.SAAD.    Staphylococcus epidermidis NOT DETECTED NOT DETECTED Final   Staphylococcus lugdunensis NOT DETECTED NOT DETECTED Final   Streptococcus species NOT DETECTED NOT DETECTED Final   Streptococcus agalactiae NOT DETECTED NOT DETECTED Final   Streptococcus pneumoniae NOT DETECTED NOT DETECTED Final   Streptococcus pyogenes NOT DETECTED NOT DETECTED Final   A.calcoaceticus-baumannii NOT DETECTED NOT DETECTED Final    Bacteroides fragilis NOT DETECTED NOT DETECTED Final   Enterobacterales NOT DETECTED NOT DETECTED Final   Enterobacter cloacae complex NOT DETECTED NOT DETECTED Final   Escherichia coli NOT DETECTED NOT DETECTED Final   Klebsiella aerogenes NOT DETECTED NOT DETECTED Final   Klebsiella oxytoca NOT DETECTED NOT DETECTED Final   Klebsiella pneumoniae NOT DETECTED NOT DETECTED Final   Proteus species NOT DETECTED NOT DETECTED Final   Salmonella species NOT DETECTED NOT DETECTED Final   Serratia marcescens NOT DETECTED NOT DETECTED Final   Haemophilus influenzae NOT DETECTED NOT  DETECTED Final   Neisseria meningitidis NOT DETECTED NOT DETECTED Final   Pseudomonas aeruginosa NOT DETECTED NOT DETECTED Final   Stenotrophomonas maltophilia NOT DETECTED NOT DETECTED Final   Candida albicans NOT DETECTED NOT DETECTED Final   Candida auris NOT DETECTED NOT DETECTED Final   Candida glabrata NOT DETECTED NOT DETECTED Final   Candida krusei NOT DETECTED NOT DETECTED Final   Candida parapsilosis NOT DETECTED NOT DETECTED Final   Candida tropicalis NOT DETECTED NOT DETECTED Final   Cryptococcus neoformans/gattii NOT DETECTED NOT DETECTED Final   Meth resistant mecA/C and MREJ NOT DETECTED NOT DETECTED Final    Comment: Performed at [facility], [place] [address], [place], [place] [address]     Serology:   Imaging: If present, new imagings (plain films, ct scans, and mri) have been personally visualized and interpreted; radiology reports have been reviewed. Decision making incorporated into the Impression / Recommendations.  9/23 left wrist cxr Normal   9/22 right hip ct with contrast 1. No acute osseous abnormality of the right hip. 2. Mild degenerative changes of the right hip.    Jabier Mutton, [facility] [place] [phone] pager    04/18/2021, 2:17 PM

## 2021-04-18 NOTE — Progress Notes (Signed)
[facility] Progress Note  Patient: Phillip Young    [id number]  DOA: 04/12/2021     Date of Service: the patient was seen and examined on 04/18/2021  Brief hospital course: Past medical history of combined systolic and diastolic biventricular failure, type II DM, PAF on Eliquis, ESRD on HD, recent admission for cardiogenic shock and cardiorenal syndrome.  Presents to the hospital for shortness of breath and joint pains. Found to have MSSA bacteremia. ID, nephrology consulted Currently plan is continue current therapy.  Subjective: Patient reports more pain.  No nausea no vomiting.  Minimal oral intake secondary to pain in his jaw.  Assessment and Plan: 1.  MSSA bacteremia 4 out of 4 bottles positive for MSSA.  Blood cultures performed on 9/22. ID consulted. Echocardiogram ordered. On IV cefazolin. Repeat cultures ordered. Will require TEE if TTE is negative for endocarditis. Patient will require a line holiday as well.  Nephrology following.  2.  Left-sided hip and breast pain as well as left jaw pain. Right knee pain. Possibly polyarticular gout although now the patient could be suspected to have polyarticular infection. Patient was treated with prednisone will discontinue. Patient was also given colchicine. Started on allopurinol.  Uric acid level is normal. Continue pain medication. Patient appears to be drowsy during conversation. Noted to perform MRI. CT pelvis ordered by ID.  I will add a CT maxillofacial for jaw pain. Patient is drowsy intermittently and therefore will not increase pain regimen.  3.  ESRD on HD Nephrology consult (appreciate assistance. HD catheter will be removed.  4.  Chronic respiratory failure. OSA with morbid obesity. Acute on chronic biventricular heart failure On 4 LPM at home.  Concentrator was malfunctioning therefore high requirement of increased oxygen on admission. Also on BiPAP at home which was continued. Case management  working to ensure oxygen delivery at home. Volume appears to be adequate. On midodrine 20 mg 3 times a day. Not a candidate for ICD per heart failure team. Currently in sinus rhythm on amiodarone. Therapeutic to Eliquis.  5.  Morbid obesity Placing the patient at high risk for poor outcome.  Body mass index is 58.36 kg/m.   6.  Compliance issue. Patient has hard time remaining compliant with his warfarin taper.  Patient also appears to be using multiple supplements which I will be holding given presence of sodium   DVT Prophylaxis:    apixaban (ELIQUIS) tablet 5 mg    Pt is Full code.  Communication: no family was present at bedside, at the time of interview.   Data Reviewed: I have personally reviewed and interpreted daily labs, tele strips, imaging. Mild hyponatremia.  Hyperkalemia also there.  Leukocytosis worsening.  Hemoglobin stable.  Physical Exam:  General: Appear in mild distress, no Rash; Oral Mucosa Clear, moist. no Abnormal Neck Mass Or lumps, Conjunctiva normal  Cardiovascular: S1 and S2 Present, no Murmur, Respiratory: increased respiratory effort, Bilateral Air entry present and bilateral  Crackles, no wheezes Abdomen: Bowel Sound present, Soft and no tenderness Extremities: trace Pedal edema Neurology: Drowsy and oriented to time, place, and person affect appropriate. no new focal deficit Gait not checked due to patient safety concerns    Vitals:   04/18/21 1149 04/18/21 1200 04/18/21 1300 04/18/21 1600  BP: (!) 81/60 (!) 107/54 103/80 (!) 119/98  Pulse: 70 69 77 79  Resp: 20 (!) '22 20 20  '$ Temp: 98 F (36.7 C)   98.2 F (36.8 C)  TempSrc: Oral   Oral  SpO2: 96%  94% 97% 96%  Weight:      Height:        Disposition:  Status is: Inpatient  Remains inpatient appropriate because:IV treatments appropriate due to intensity of illness or inability to take PO  Dispo: The patient is from: Home              Anticipated d/c is to: to be determined               Patient currently is not medically stable to d/c.   Difficult to place patient No  Time spent: 35 minutes. I reviewed all nursing notes, pharmacy notes, vitals, pertinent old records. I have discussed plan of care as described above with RN.  Author: Berle Mull, MD [facility] 04/18/2021 7:24 PM  To reach On-call, see care teams to locate the attending and reach out via [email or web address]. Between 7PM-7AM, please contact night-coverage If you still have difficulty reaching the attending provider, please page the [facility] (Director on Call) for [facility] on amion for assistance.

## 2021-04-18 NOTE — Plan of Care (Signed)
  Problem: Fluid Volume: Goal: Fluid volume balance will be maintained or improved Outcome: Not Progressing Note: Patient needs reinforcement with fluid restriction

## 2021-04-18 NOTE — Progress Notes (Signed)
Pt refuses cpap he states he is coughing to bad and wants to stay off it tonight.

## 2021-04-18 NOTE — Procedures (Signed)
PROCEDURE SUMMARY:  Successful removal of tunneled hemodialysis catheter.  Patient tolerated well.  EBL < 5 mL  There were definite signs of infection with purulent drainage present on removal.  See full dictation in Imaging for details.  Nasim Garofano S Cambren Helm PA-C 04/18/2021 10:22 AM

## 2021-04-18 NOTE — Progress Notes (Signed)
  Echocardiogram 2D Echocardiogram has been performed.  Phillip Young 04/18/2021, 2:57 PM

## 2021-04-18 NOTE — Progress Notes (Signed)
Patient transported off the unit for dialysis.

## 2021-04-19 ENCOUNTER — Inpatient Hospital Stay (HOSPITAL_COMMUNITY): Payer: BC Managed Care – PPO

## 2021-04-19 DIAGNOSIS — J9611 Chronic respiratory failure with hypoxia: Secondary | ICD-10-CM | POA: Diagnosis not present

## 2021-04-19 LAB — CULTURE, BLOOD (ROUTINE X 2): Special Requests: ADEQUATE

## 2021-04-19 LAB — RENAL FUNCTION PANEL
Albumin: 2.6 g/dL — ABNORMAL LOW (ref 3.5–5.0)
Anion gap: 16 — ABNORMAL HIGH (ref 5–15)
BUN: 52 mg/dL — ABNORMAL HIGH (ref 6–20)
CO2: 24 mmol/L (ref 22–32)
Calcium: 9.5 mg/dL (ref 8.9–10.3)
Chloride: 90 mmol/L — ABNORMAL LOW (ref 98–111)
Creatinine, Ser: 5.17 mg/dL — ABNORMAL HIGH (ref 0.61–1.24)
GFR, Estimated: 14 mL/min — ABNORMAL LOW (ref >60)
Glucose, Bld: 112 mg/dL — ABNORMAL HIGH (ref 70–99)
Phosphorus: 6.8 mg/dL — ABNORMAL HIGH (ref 2.5–4.6)
Potassium: 4.7 mmol/L (ref 3.5–5.1)
Sodium: 130 mmol/L — ABNORMAL LOW (ref 135–145)

## 2021-04-19 LAB — CBC WITH DIFFERENTIAL/PLATELET
Abs Immature Granulocytes: 0.4 10*3/uL — ABNORMAL HIGH (ref 0.00–0.07)
Basophils Absolute: 0 10*3/uL (ref 0.0–0.1)
Basophils Relative: 0 %
Eosinophils Absolute: 0.1 10*3/uL (ref 0.0–0.5)
Eosinophils Relative: 1 %
HCT: 28.9 % — ABNORMAL LOW (ref 39.0–52.0)
Hemoglobin: 9 g/dL — ABNORMAL LOW (ref 13.0–17.0)
Immature Granulocytes: 3 %
Lymphocytes Relative: 9 %
Lymphs Abs: 1.5 10*3/uL (ref 0.7–4.0)
MCH: 30.9 pg (ref 26.0–34.0)
MCHC: 31.1 g/dL (ref 30.0–36.0)
MCV: 99.3 fL (ref 80.0–100.0)
Monocytes Absolute: 1.1 10*3/uL — ABNORMAL HIGH (ref 0.1–1.0)
Monocytes Relative: 7 %
Neutro Abs: 13 10*3/uL — ABNORMAL HIGH (ref 1.7–7.7)
Neutrophils Relative %: 80 %
Platelets: 343 10*3/uL (ref 150–400)
RBC: 2.91 MIL/uL — ABNORMAL LOW (ref 4.22–5.81)
RDW: 19.2 % — ABNORMAL HIGH (ref 11.5–15.5)
WBC: 16.2 10*3/uL — ABNORMAL HIGH (ref 4.0–10.5)
nRBC: 0.4 % — ABNORMAL HIGH (ref 0.0–0.2)

## 2021-04-19 IMAGING — CT CT PELVIS W/O CM
3 of 5 series · 16 of 47 positions shown, 19 images · non-contrast
Comparison: None.

CLINICAL DATA: Sepsis, left hip pain, abdominal abscess/infection
suspected.

EXAM:
CT PELVIS WITHOUT CONTRAST
TECHNIQUE: Multidetector CT imaging of the pelvis was performed following the
standard protocol without intravenous contrast.

[Series 4: soft tissue · axial · 0.98mm/px · z∈[-1128,-742]mm · 12 of 91 slices shown, 15 images]
[im 7/91  brain]
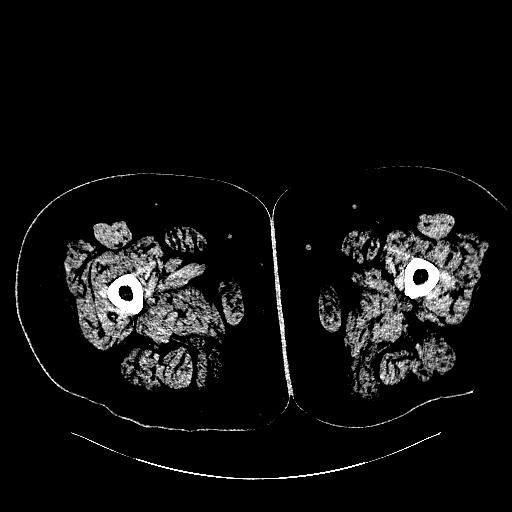
[im 7/91  bone]
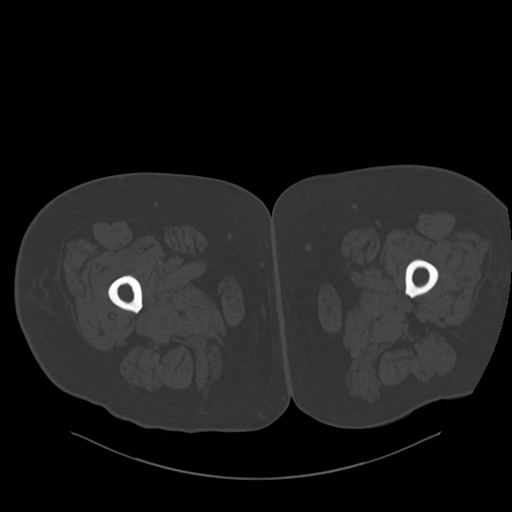
[im 13/91  brain]
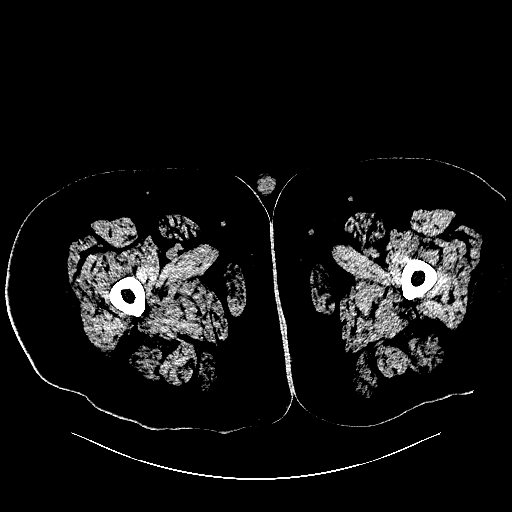
[im 19/91  brain]
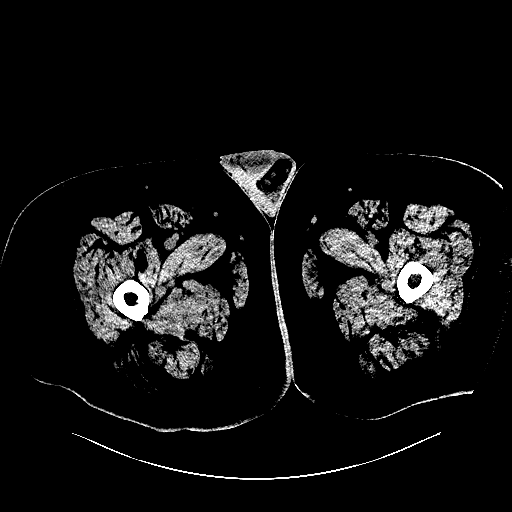
[im 28/91  brain]
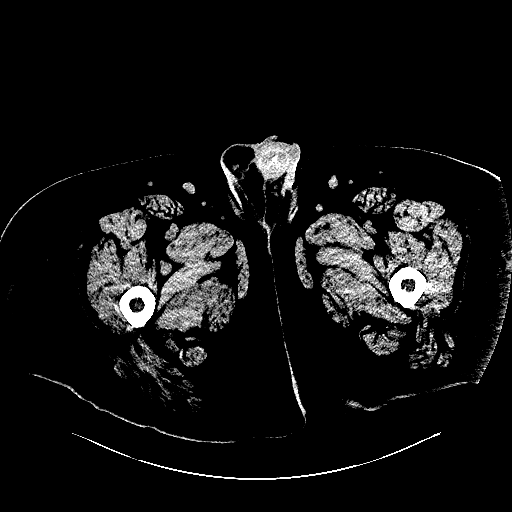
[im 35/91  brain]
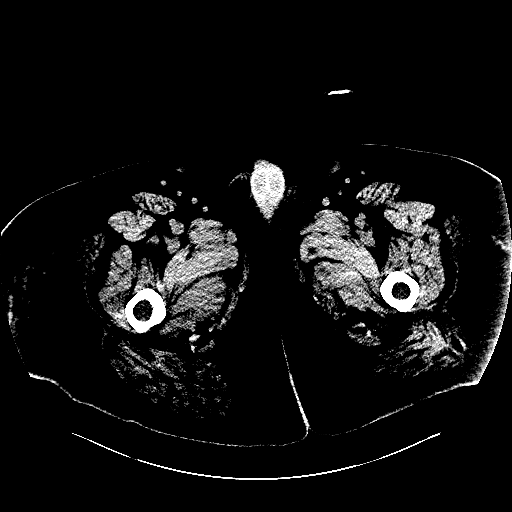
[im 35/91  bone]
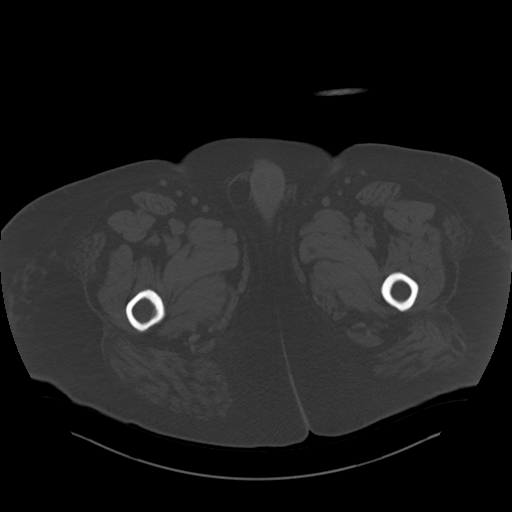
[im 41/91  brain]
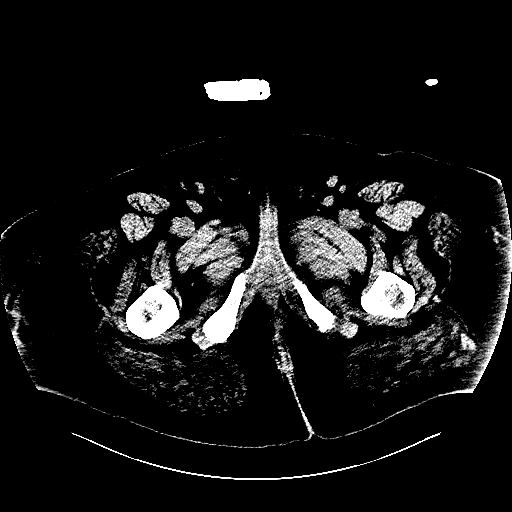
[im 50/91  brain]
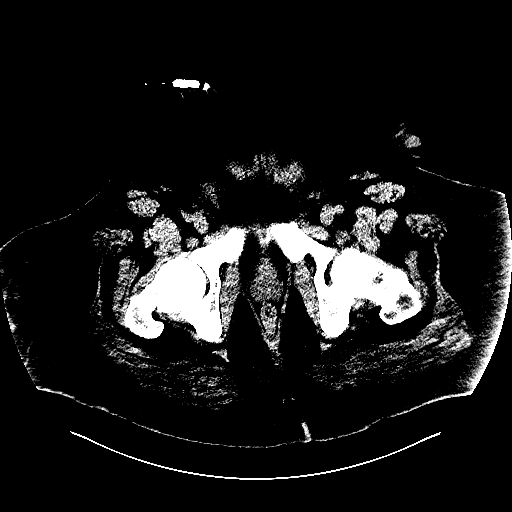
[im 56/91  brain]
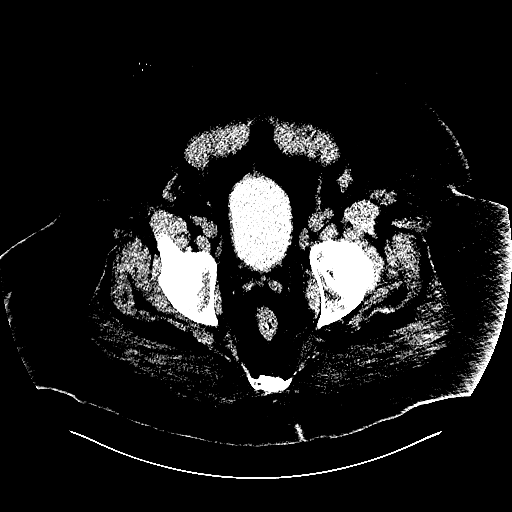
[im 63/91  brain]
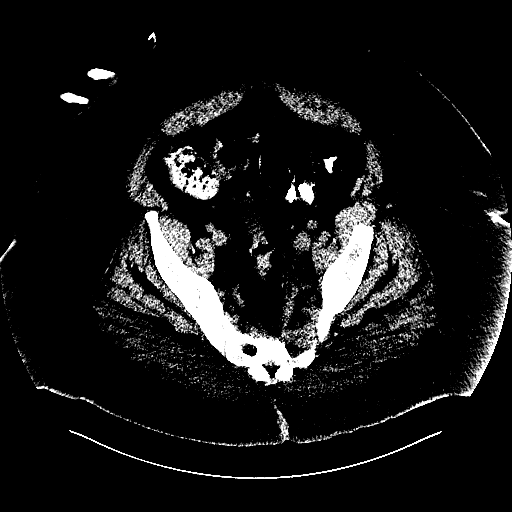
[im 63/91  bone]
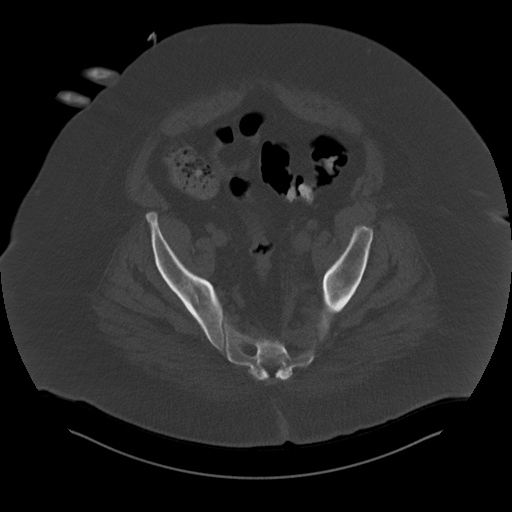
[im 72/91  brain]
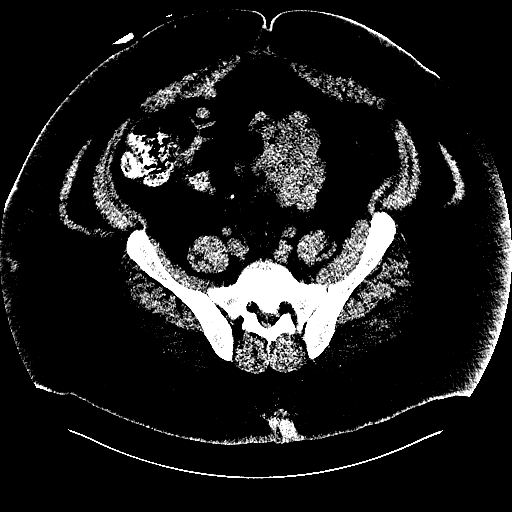
[im 78/91  brain]
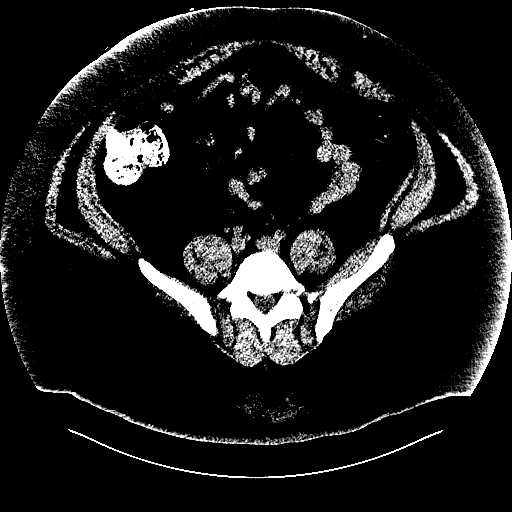
[im 84/91  brain]
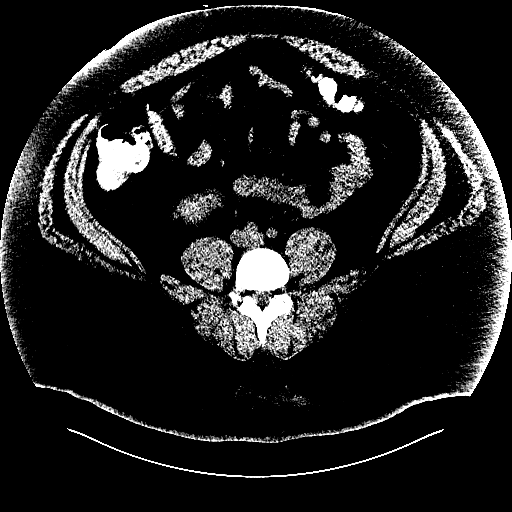

[Series 6: cor st · coronal · 0.90mm/px · 3 of 218 slices shown]
[im 55/218  brain]
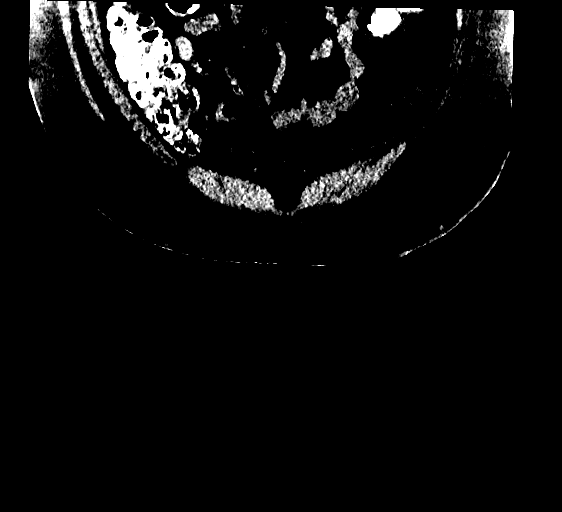
[im 109/218  brain]
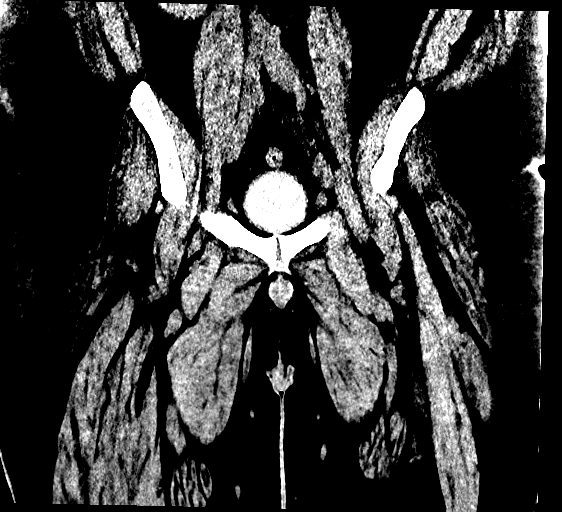
[im 163/218  brain]
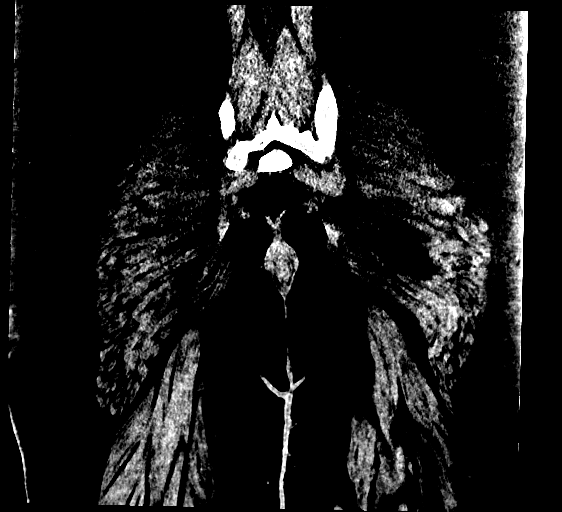

[Series 7: sag st · sagittal · 0.85mm/px · 1 of 253 slices shown]
[im 127/253  brain]
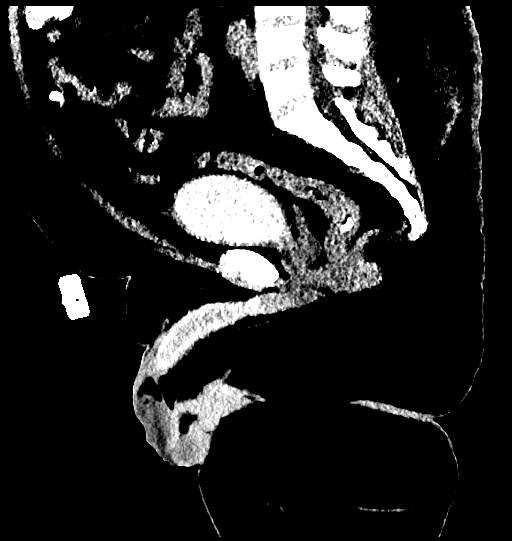

[16 of 47 positions shown; findings below may reference images not displayed]

FINDINGS: Urinary Tract:  No abnormality visualized.

Bowel:  Unremarkable visualized pelvic bowel loops.

Vascular/Lymphatic: No pathologically enlarged lymph nodes. No
significant vascular abnormality seen.

Reproductive:  Prostate gland unremarkable.

Other: Tiny fat containing umbilical and small fat containing right
inguinal hernias are present.

Musculoskeletal: Osseous structures are unremarkable. Hip joint
spaces are preserved. Sacroiliac joint spaces are preserved.
Periarticular soft tissues of the left hip are unremarkable.
IMPRESSION: No acute abnormality identified. No radiographic explanation for
the patient's reported symptoms.

## 2021-04-19 IMAGING — CT CT MAXILLOFACIAL W/O CM
3 of 6 series · 16 of 47 positions shown, 19 images · non-contrast
Comparison: None.

CLINICAL DATA: Maxillofacial pain

EXAM:
CT MAXILLOFACIAL WITHOUT CONTRAST
TECHNIQUE: Multidetector CT imaging of the maxillofacial structures was
performed. Multiplanar CT image reconstructions were also generated.

[Series 4: maxilllofacial 2.0 hr40 3 · axial · 0.43mm/px · z∈[-268,-96]mm · 11 of 102 slices shown, 14 images]
[im 8/102  brain]
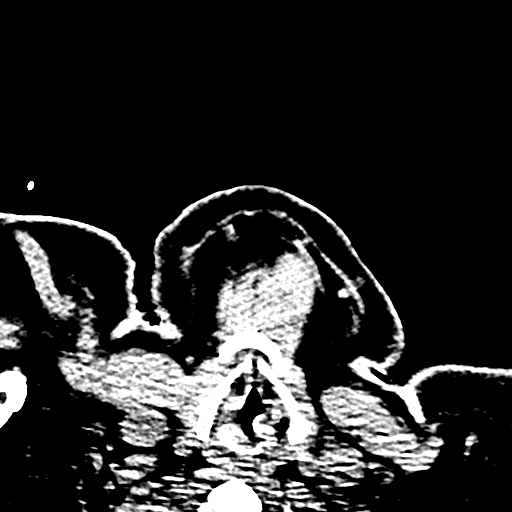
[im 8/102  bone]
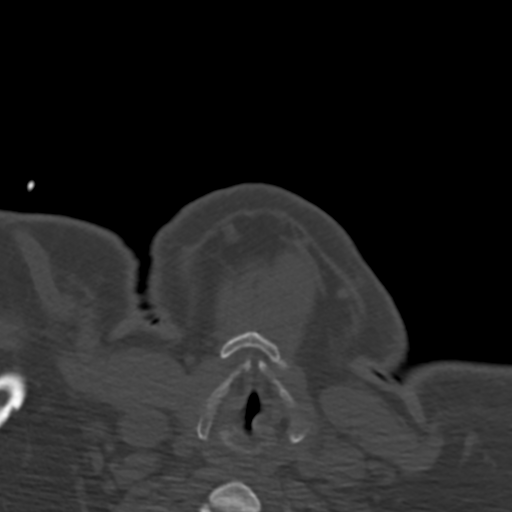
[im 15/102  bone]
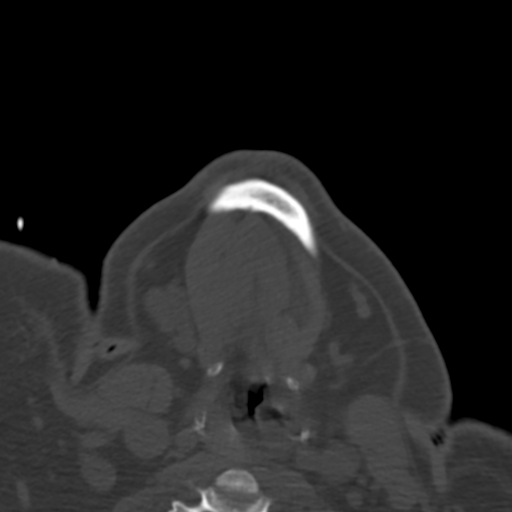
[im 22/102  bone]
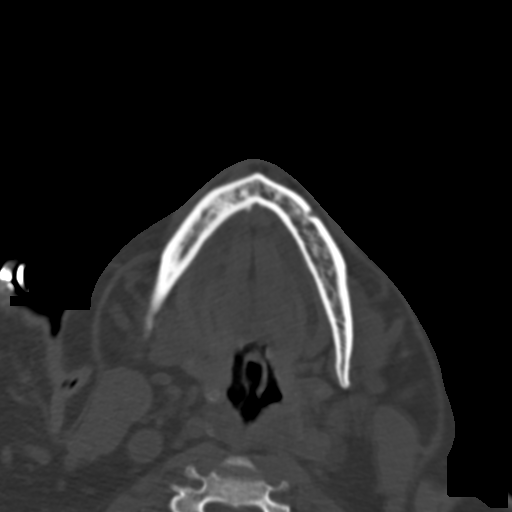
[im 37/102  bone]
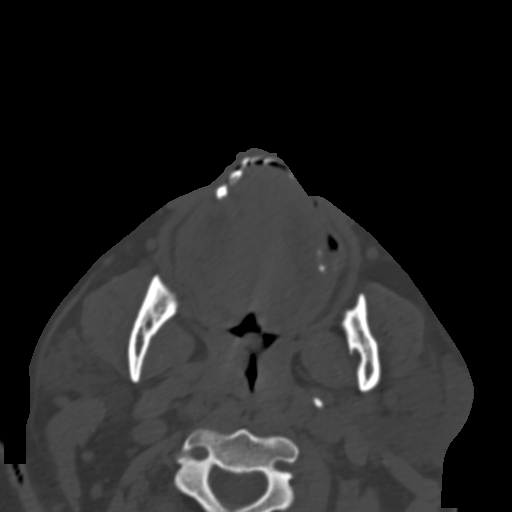
[im 44/102  brain]
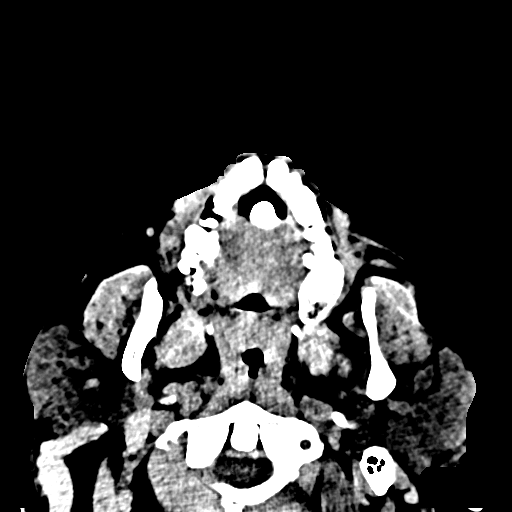
[im 44/102  bone]
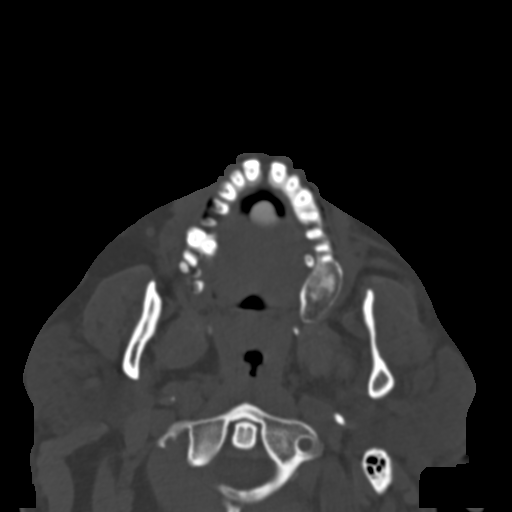
[im 51/102  bone]
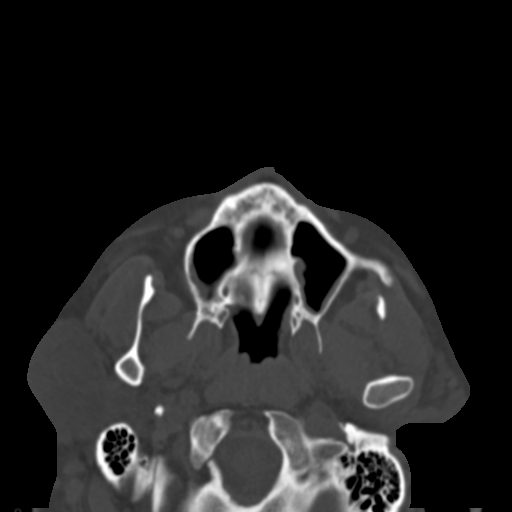
[im 58/102  bone]
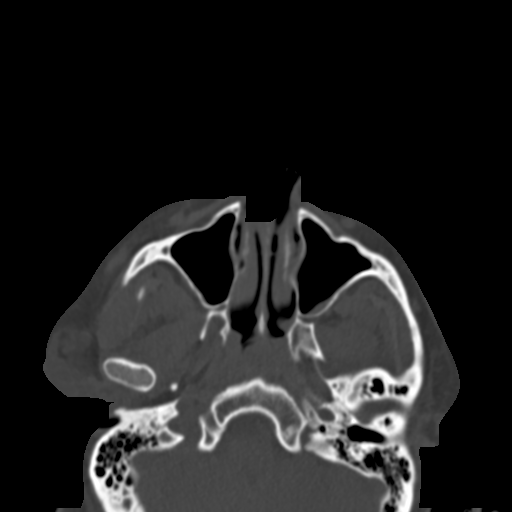
[im 65/102  bone]
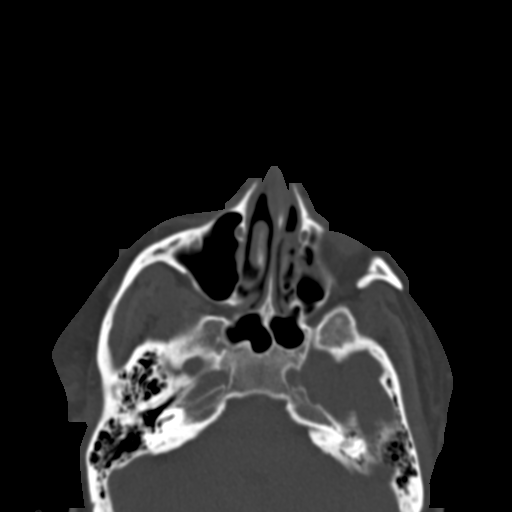
[im 80/102  brain]
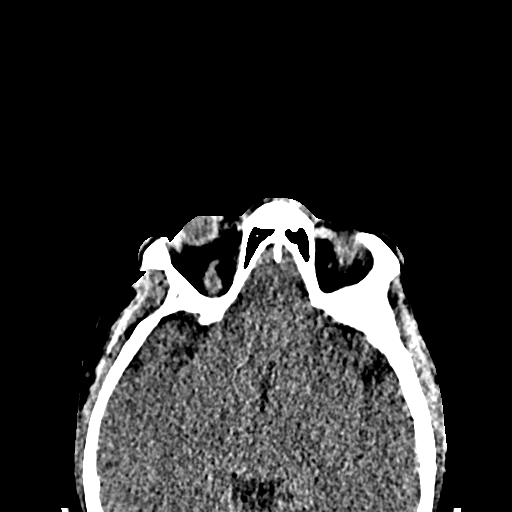
[im 80/102  bone]
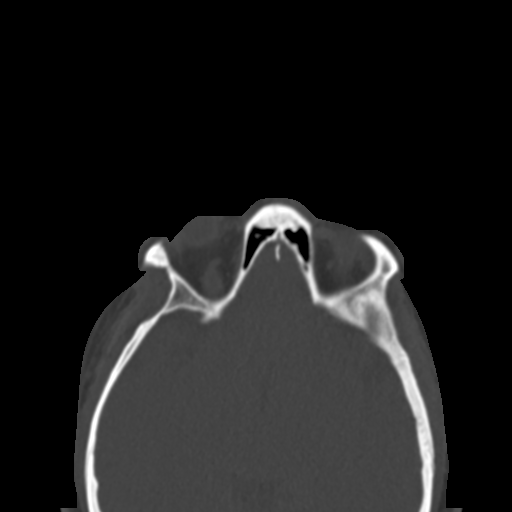
[im 87/102  bone]
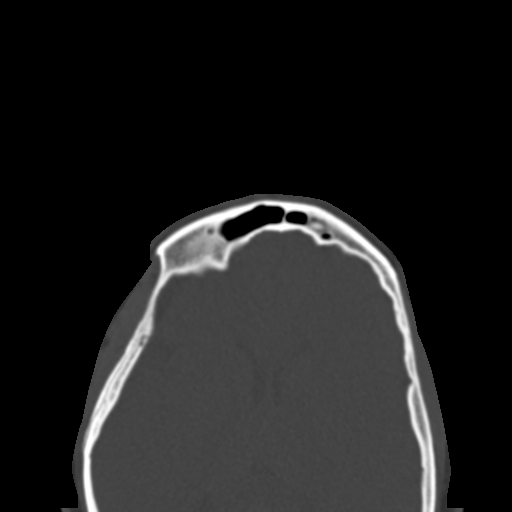
[im 94/102  bone]
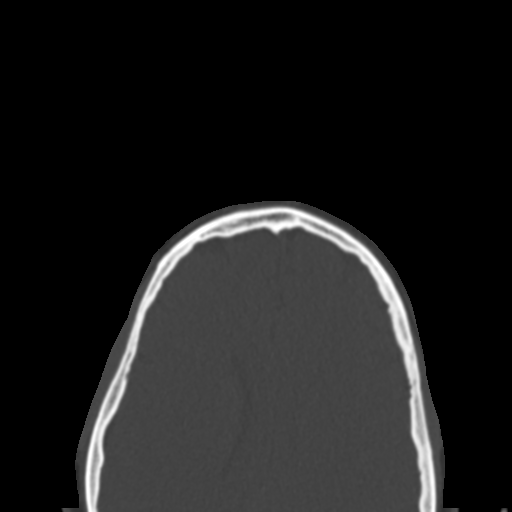

[Series 8: st cor · coronal · 0.37mm/px · 3 of 100 slices shown]
[im 25/100  bone]
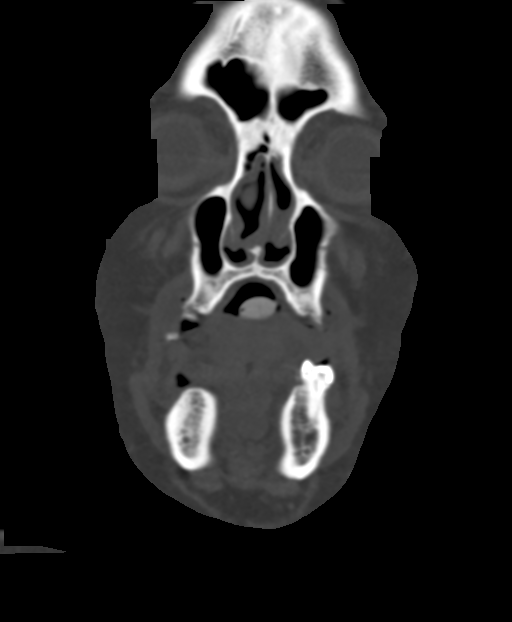
[im 50/100  bone]
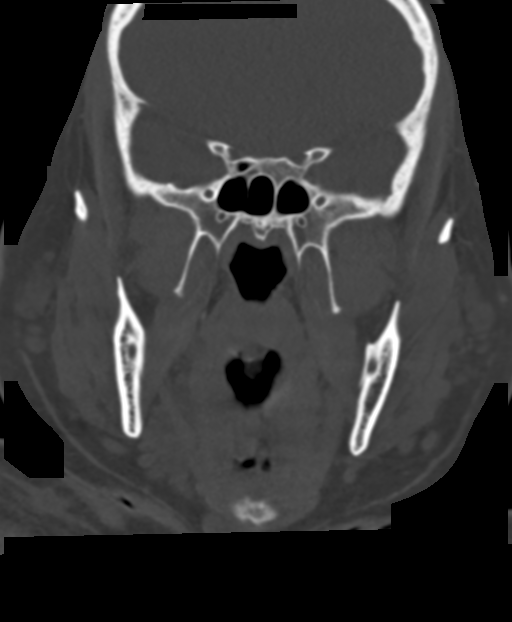
[im 75/100  bone]
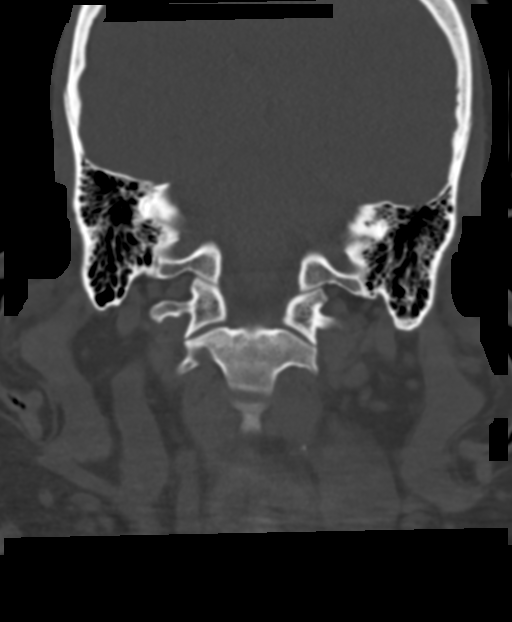

[Series 11: bone sag · sagittal · 0.43mm/px · 2 of 105 slices shown]
[im 35/105  bone]
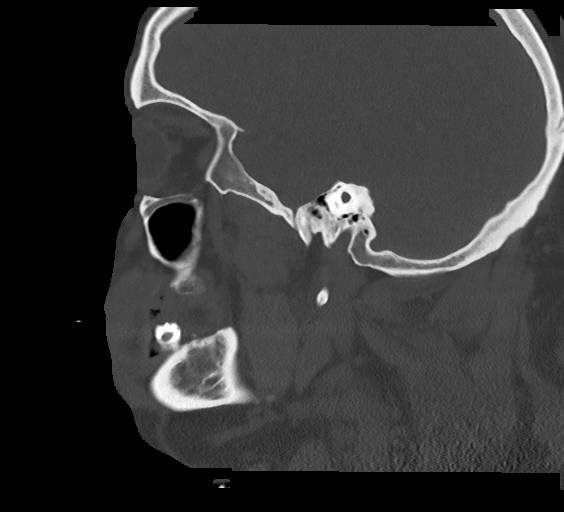
[im 70/105  bone]
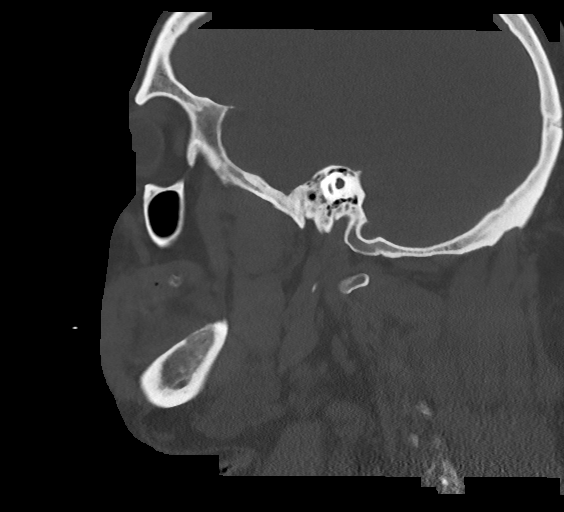

[16 of 47 positions shown; findings below may reference images not displayed]

FINDINGS: Osseous: No acute facial fracture. No mandibular dislocation.
Extensive periodontal disease with numerous absent dentition,
extensive dental caries, and periapical lucencies involving the
residual maxillary molars.

Orbits: Negative. No traumatic or inflammatory finding.

Sinuses: There is mild mucosal thickening within the a maxillary
sinuses bilaterally in keeping with mild paranasal sinus disease. No
air-fluid levels. Remaining paranasal sinuses are clear.

Soft tissues: Negative.

Limited intracranial: No significant or unexpected finding.
IMPRESSION: No acute abnormality identified.  Extensive periodontal disease.

## 2021-04-19 NOTE — Progress Notes (Signed)
Id brief update   Afebrile Wbc down Pelvis ct no obvious pyogenic focus to explain pain   -continue cefazolin -repeat bcx not yet drawn ?difficult stick -tte no obvious veg -will need to arrange tee this week; unable to contact cardmaster this weekend

## 2021-04-19 NOTE — Progress Notes (Signed)
[facility] Progress Note  Patient: Phillip Young    [id number]  DOA: 04/12/2021     Date of Service: the patient was seen and examined on 04/19/2021  Brief hospital course: Past medical history of combined systolic and diastolic biventricular failure, type II DM, PAF on Eliquis, ESRD on HD, recent admission for cardiogenic shock and cardiorenal syndrome.  Presents to the hospital for shortness of breath and joint pains. Found to have MSSA bacteremia. ID, nephrology consulted Currently plan is continue IV antibiotics and monitor for improvement in negative culture.  Subjective: Pain somewhat better.  No nausea no vomiting.  No diarrhea.  No constipation.  Sleepy throughout the interaction as he did not use CPAP last night.  Assessment and Plan: 1.  MSSA bacteremia 4 out of 4 bottles positive for MSSA.   Blood cultures performed on 9/22. ID consulted. Echocardiogram ordered. On IV cefazolin. Repeat cultures ordered on  9/25. Will require TEE if TTE is negative for endocarditis. Patient will require a line holiday as well.  Nephrology following.  2.  Left-sided hip and breast pain as well as left jaw pain. Right knee pain. Possibly polyarticular gout although now the patient could be suspected to have polyarticular infection. Patient was treated with prednisone will discontinue. Patient was also given colchicine. Started on allopurinol.  Uric acid level is normal. Continue pain medication. Patient appears to be drowsy during conversation. Unable to perform MRI due to weight restriction. CT pelvis negative for any acute abnormality and hip joint.  CT maxillofacial negative for any acute abnormality in TM joint. Patient is drowsy intermittently and therefore will not increase pain regimen.  3.  ESRD on HD Nephrology consult (appreciate assistance. HD catheter will be removed.  4.  Chronic respiratory failure. OSA with morbid obesity. Acute on chronic biventricular  heart failure On 4 LPM at home.   Concentrator was malfunctioning therefore high requirement of increased oxygen on admission. Also on BiPAP at home which was continued. Case management working to ensure oxygen delivery at home. Volume appears to be adequate. On midodrine 20 mg 3 times a day. Not a candidate for ICD per heart failure team. Currently in sinus rhythm on amiodarone. Therapeutic to Eliquis.  5.  Morbid obesity Placing the patient at high risk for poor outcome.  Body mass index is 58.36 kg/m.   6.  Compliance issue. Patient has hard time remaining compliant with his warfarin taper.  Patient also appears to be using multiple supplements which I will be holding given presence of sodium   DVT Prophylaxis:    apixaban (ELIQUIS) tablet 5 mg    Pt is Full code.  Communication: no family was present at bedside, at the time of interview.   Data Reviewed: I have personally reviewed and interpreted daily labs, tele strips, imaging.  Mild hyponatremia.  WBC improving.  Hemoglobin stable.  Physical Exam:  General: Appear in mild distress, no Rash; Oral Mucosa Clear, moist. no Abnormal Neck Mass Or lumps, Conjunctiva normal  Cardiovascular: S1 and S2 Present, no Murmur, Respiratory: good respiratory effort, Bilateral Air entry present and CTA, no Crackles, no wheezes Abdomen: Bowel Sound present, Soft and no tenderness Extremities: no Pedal edema Neurology: alert and oriented to time, place, and person affect appropriate. no new focal deficit Gait not checked due to patient safety concerns  Vitals:   04/19/21 0410 04/19/21 0710 04/19/21 1130 04/19/21 1705  BP: (!) 92/42 99/62 116/60 121/72  Pulse: 76 80 75 80  Resp: 19 19 (!)  21 20  Temp: 98.6 F (37 C) 98.1 F (36.7 C) 98.2 F (36.8 C) 98.4 F (36.9 C)  TempSrc: Axillary Oral Oral Oral  SpO2: 95% 90% 93% 93%  Weight:      Height:        Disposition:  Status is: Inpatient  Remains inpatient appropriate  because:IV treatments appropriate due to intensity of illness or inability to take PO  Dispo: The patient is from: Home              Anticipated d/c is to: to be determined              Patient currently is not medically stable to d/c.   Difficult to place patient No  Time spent: 35 minutes. I reviewed all nursing notes, pharmacy notes, vitals, pertinent old records. I have discussed plan of care as described above with RN.  Author: Berle Mull, MD [facility] 04/19/2021 7:35 PM  To reach On-call, see care teams to locate the attending and reach out via [email or web address]. Between 7PM-7AM, please contact night-coverage If you still have difficulty reaching the attending provider, please page the [facility] (Director on Call) for [facility] on amion for assistance.

## 2021-04-19 NOTE — Progress Notes (Signed)
Asked pt if he was going to wear CPAP tonight and pt stated " no he is in too much pain to do anything".

## 2021-04-19 NOTE — Progress Notes (Signed)
[facility] Progress Note  Subjective: seen in room  Vitals:   04/18/21 2020 04/18/21 2324 04/19/21 0410 04/19/21 0710  BP: 104/61 118/62 (!) 92/42 99/62  Pulse: 73 76 76 80  Resp: '20 20 19 19  '$ Temp: 98.5 F (36.9 C) 98.5 F (36.9 C) 98.6 F (37 C) 98.1 F (36.7 C)  TempSrc: Oral Oral Axillary Oral  SpO2: 96% 93% 95% 90%  Weight:      Height:        Exam:  alert, nad , obese, nasal O2  Lying flat, no ^wob  no jvd  Chest cta bilat  Cor reg no RG  Abd soft ntnd no ascites   Ext no LE edema   Alert, NF O x3  RIJ TDC      Home meds include - eliquis 5 bid, amiodarone, lanthanum 500 mg ac tid, midodrine '20mg'$  tid      OP HD: TTS/ AKI [facility]    4h  400/800    188.5kg   2/2 bath  TDC RIJ  Hep none   - no in center meds     Assessment/ Plan: MSSA bacteremia - bcx's + 9/22. HD cath related infection. Exit site purulence noted w/ TDC removal 9/24. For new temp cath vs TDC on tuesday.  Will get surveillance cx's today. On IV abx.  Fatigue/ multi-joint pain - due to #1 and/or gout. Per pmd.  SOB - likely that his O2 equipment ran out or malfunctioned. SOB resolved w/ O2 supplied here.  ESRD - on HD TTS. Has HD early today. Next HD Tuesday next week Volume - 3.5 L off w/ last HD, came off +1kg. Demanding more po fluids than usual esrd 1200 cc/d d/t chronic cough and other health problems. ^'d fluids to 1800cc /d.  Bivent CHF - severe Hypotension - on midodrine 20 tid Atrial fib - on amio/ eliquis MBD ckd - cont binder Anemia ckd - Hb 9.9 here, stable       Rob Bellanie Matthew 04/19/2021, 10:54 AM   Recent Labs  Lab 04/18/21 0030 04/19/21 0048  K 6.2* 4.7  BUN 61* 52*  CREATININE 5.72* 5.17*  CALCIUM 9.9 9.5  PHOS 8.4* 6.8*  HGB 8.9* 9.0*    Inpatient medications:  allopurinol  100 mg Oral Daily   amiodarone  200 mg Oral Daily   apixaban  5 mg Oral BID   Chlorhexidine Gluconate Cloth  6 each Topical Daily   Chlorhexidine Gluconate Cloth  6  each Topical Q0600   lanthanum  500 mg Oral TID WC   midodrine  20 mg Oral TID WC    anticoagulant sodium citrate      ceFAZolin (ANCEF) IV 1 g (04/18/21 1502)   acetaminophen **OR** acetaminophen, anticoagulant sodium citrate, cyclobenzaprine, ipratropium-albuterol, ondansetron **OR** ondansetron (ZOFRAN) IV, oxyCODONE-acetaminophen

## 2021-04-20 ENCOUNTER — Encounter (HOSPITAL_COMMUNITY): Payer: Self-pay | Admitting: Internal Medicine

## 2021-04-20 ENCOUNTER — Ambulatory Visit: Payer: BC Managed Care – PPO

## 2021-04-20 DIAGNOSIS — J9611 Chronic respiratory failure with hypoxia: Secondary | ICD-10-CM | POA: Diagnosis not present

## 2021-04-20 LAB — RENAL FUNCTION PANEL
Albumin: 2.6 g/dL — ABNORMAL LOW (ref 3.5–5.0)
Anion gap: 16 — ABNORMAL HIGH (ref 5–15)
BUN: 72 mg/dL — ABNORMAL HIGH (ref 6–20)
CO2: 22 mmol/L (ref 22–32)
Calcium: 9.8 mg/dL (ref 8.9–10.3)
Chloride: 91 mmol/L — ABNORMAL LOW (ref 98–111)
Creatinine, Ser: 5.58 mg/dL — ABNORMAL HIGH (ref 0.61–1.24)
GFR, Estimated: 13 mL/min — ABNORMAL LOW (ref >60)
Glucose, Bld: 107 mg/dL — ABNORMAL HIGH (ref 70–99)
Phosphorus: 8.1 mg/dL — ABNORMAL HIGH (ref 2.5–4.6)
Potassium: 4.7 mmol/L (ref 3.5–5.1)
Sodium: 129 mmol/L — ABNORMAL LOW (ref 135–145)

## 2021-04-20 LAB — CBC WITH DIFFERENTIAL/PLATELET
Abs Immature Granulocytes: 0.39 10*3/uL — ABNORMAL HIGH (ref 0.00–0.07)
Basophils Absolute: 0.1 10*3/uL (ref 0.0–0.1)
Basophils Relative: 0 %
Eosinophils Absolute: 0.2 10*3/uL (ref 0.0–0.5)
Eosinophils Relative: 2 %
HCT: 32.2 % — ABNORMAL LOW (ref 39.0–52.0)
Hemoglobin: 10.3 g/dL — ABNORMAL LOW (ref 13.0–17.0)
Immature Granulocytes: 3 %
Lymphocytes Relative: 8 %
Lymphs Abs: 1.2 10*3/uL (ref 0.7–4.0)
MCH: 31.4 pg (ref 26.0–34.0)
MCHC: 32 g/dL (ref 30.0–36.0)
MCV: 98.2 fL (ref 80.0–100.0)
Monocytes Absolute: 0.8 10*3/uL (ref 0.1–1.0)
Monocytes Relative: 5 %
Neutro Abs: 11.8 10*3/uL — ABNORMAL HIGH (ref 1.7–7.7)
Neutrophils Relative %: 82 %
Platelets: 341 10*3/uL (ref 150–400)
RBC: 3.28 MIL/uL — ABNORMAL LOW (ref 4.22–5.81)
RDW: 18.8 % — ABNORMAL HIGH (ref 11.5–15.5)
WBC: 14.4 10*3/uL — ABNORMAL HIGH (ref 4.0–10.5)
nRBC: 0.3 % — ABNORMAL HIGH (ref 0.0–0.2)

## 2021-04-20 MED ORDER — COLCHICINE 0.6 MG PO TABS
0.6000 mg | ORAL_TABLET | ORAL | Status: DC
  Administered 2021-04-20: 0.6 mg via ORAL
  Filled 2021-04-20: qty 1

## 2021-04-20 NOTE — Progress Notes (Signed)
[facility] Progress Note  Patient: Phillip Young    [id number]  DOA: 04/12/2021     Date of Service: the patient was seen and examined on 04/20/2021  Brief hospital course: Past medical history of combined systolic and diastolic biventricular failure, type II DM, PAF on Eliquis, ESRD on HD, recent admission for cardiogenic shock and cardiorenal syndrome.  Presents to the hospital for shortness of breath and joint pains. Found to have MSSA bacteremia. ID, nephrology consulted Currently plan is continue IV antibiotics, HD catheter replacement likely tomorrow, ensure resolution of bacteremia.  Subjective: Continues to report pain in his leg.  Now reports more pain in his right knee.  No nausea no vomiting no fever no chills.  Frustrated with fluid restriction.  Frustrated with perceived communication lapses.  Assessment and Plan: 1.  MSSA bacteremia 4 out of 4 bottles positive for MSSA.   Blood cultures performed on 9/22. ID consulted. Echocardiogram negative for any vegetation.  TEE ordered. On IV cefazolin. Repeat cultures ordered on  9/25. Line holiday completed.  We will be getting a new HD catheter on 9/27.   2.  Left-sided hip pain, left wrist pain, left jaw pain, right knee pain. With concern for polyarticular gout, patient was treated with prednisone.  Currently discontinued. Patient was also given colchicine. Started on allopurinol.  Uric acid level is normal. Unable to perform MRI due to dimension restrictions. CT pelvis negative for any acute abnormality in hip joint. CT maxillofacial negative for any acute abnormality in TM joint. Will check CT right knee.  3.  ESRD on HD Nephrology consult appreciate assistance. IR consulted. HD catheter removed.  New catheter will be inserted on 9/27.   4.  Chronic respiratory failure. OSA with morbid obesity. Acute on chronic biventricular heart failure On 4 LPM at home.   Concentrator was malfunctioning therefore high  requirement of increased oxygen on admission. Also on BiPAP at home which was continued. Case management working to ensure oxygen delivery at home. Volume appears to be adequate. On midodrine 20 mg 3 times a day. Not a candidate for ICD per heart failure team. Currently in sinus rhythm on amiodarone. Therapeutic to Eliquis.   5.  Compliance issue. Patient refusing midodrine for last 2 days. Patient refusing to wear BiPAP for last 2 days. Patient frustrated and sometimes verbally abusive to the staff with regards to his fluid restriction. Explained to patient multiple times with regards to importance of this medical intervention for his long-term health and care. Patient currently verbalized understanding.  6.  Pain control Difficult. Although given lack of any evidence of abnormality on CT pelvis further increase in narcotics is not warranted. Also Concern is with his lack of compliance with BiPAP patient remains at high risk for respiratory decompensation. This was explained to the patient. Patient will verbalize understanding  7. Morbid obesity Placing the patient at high risk for poor outcome.  Body mass index is 58.36 kg/m.   DVT Prophylaxis:    apixaban (ELIQUIS) tablet 5 mg    Pt is Full code.  Communication: family was present at bedside, at the time of interview.  The pt provided permission to discuss medical plan with the family. Opportunity was given to ask question and all questions were answered satisfactorily.   Data Reviewed: I have personally reviewed and interpreted daily labs, tele strips, imaging. Blood cultures negative.  Persistent hyponatremia.  WBC improving.  Hemoglobin stable.  Physical Exam:  General: Appear in mild distress, no Rash; Oral Mucosa  Clear, moist. no Abnormal Neck Mass Or lumps, Conjunctiva normal  Cardiovascular: S1 and S2 Present, no Murmur, Respiratory: good respiratory effort, Bilateral Air entry present and CTA, no Crackles, no  wheezes Abdomen: Bowel Sound present, Soft and no tenderness Extremities: bilateral  Pedal edema Neurology: alert and oriented to time, place, and person affect appropriate. no new focal deficit Gait not checked due to patient safety concerns   Vitals:   04/20/21 0011 04/20/21 0543 04/20/21 0734 04/20/21 1205  BP: (!) 97/54 (!) 95/59 107/62 115/73  Pulse: 82 81 74 80  Resp: '17 20 20 20  '$ Temp: 98.7 F (37.1 C) 98.2 F (36.8 C) 97.6 F (36.4 C) 98.4 F (36.9 C)  TempSrc: Oral Oral Oral Oral  SpO2: 96% 94% 94% 92%  Weight:      Height:        Disposition:  Status is: Inpatient  Remains inpatient appropriate because:Inpatient level of care appropriate due to severity of illness  Dispo: The patient is from: Home              Anticipated d/c is to: Home              Patient currently is not medically stable to d/c.   Difficult to place patient No        Time spent: 35 minutes. I reviewed all nursing notes, pharmacy notes, vitals, pertinent old records. I have discussed plan of care as described above with RN.  Author: Berle Mull, MD [facility] 04/20/2021 8:10 PM  To reach On-call, see care teams to locate the attending and reach out via [email or web address]. Between 7PM-7AM, please contact night-coverage If you still have difficulty reaching the attending provider, please page the [facility] (Director on Call) for [facility] on amion for assistance.

## 2021-04-20 NOTE — Progress Notes (Signed)
Patient ID: Phillip Young, male   DOB: 03-15-1986, 35 y.o.   MRN: [id number]  [facility] Progress Note   Assessment/ Plan:   MSSA bacteremia -suspected to be cath related bacteremia with positive blood cultures on 9/22 prompting removal of hemodialysis catheter on 9/24 due to exit site purulence to facilitate catheter holiday and clearance of bacteremia while on antibiotic therapy and with plans to replace hemodialysis catheter again on Tuesday 9/27.  Fatigue/ multi-joint pain -suspect that this is osteoarthritis versus acute gout with breakthrough pain on Percocet.  We will start renally adjusted colchicine. Shortness of breath-reportedly his O2 equipment ran out or malfunctioned.  Symptoms improved with oxygen supplementation here and should improve additionally with hemodialysis/ultrafiltration.  ESRD - on HD TTS.  Currently on catheter holiday to facilitate clearance of bacteremia and with plans for hemodialysis catheter placement again tomorrow to resume dialysis. Volume/hypertension-control of inter-dialytic weight gain has been a struggle and will be enforced rigorously here while in the hospital.  History of biventricular congestive heart failure-end-stage and not a candidate for assist device or transplant. Hypotension - on midodrine 20 tid Atrial fibrillation -appears rate controlled and is on Eliquis for anticoagulation.  Remains on amiodarone. CKD MBD-continue to reinforce renal diet along with phosphorus binder/adherence, elevated phosphorus level noted. Anemia of chronic kidney disease: Without overt blood loss, hemoglobin and hematocrit improving.  Subjective:   Complains of pain in the right knee and now bilateral hips that is poorly controlled on current analgesics.  Reports some mild shortness of breath.   Objective:   BP 107/62 (BP Location: Right Wrist)   Pulse 74   Temp 97.6 F (36.4 C) (Oral)   Resp 20   Ht '5\' 11"'$  (1.803 m)   Wt (!) 189.8 kg   SpO2 94%    BMI 58.36 kg/m   Physical Exam: Gen: Morbidly obese man who appears comfortable resting flat in bed with minimal dyspnea on conversation. CVS: Pulse regular rhythm, normal rate, S1 and S2 normal Resp: Anteriorly clear to auscultation without any rales or rhonchi, no wheeze Abd: Soft, obese, nontender, bowel sounds normal Ext: No lower extremity edema  Labs: BMET Recent Labs  Lab 04/14/21 0831 04/16/21 1526 04/18/21 0030 04/19/21 0048 04/20/21 0051  NA 129* 127* 128* 130* 129*  K 3.8 4.6 6.2* 4.7 4.7  CL 84* 87* 90* 90* 91*  CO2 '23 25 25 24 22  '$ GLUCOSE 133* 143* 126* 112* 107*  BUN 75* 65* 61* 52* 72*  CREATININE 9.96* 7.87* 5.72* 5.17* 5.58*  CALCIUM 8.4* 8.7* 9.9 9.5 9.8  PHOS 8.7* 8.8* 8.4* 6.8* 8.1*   CBC Recent Labs  Lab 04/16/21 1526 04/18/21 0030 04/19/21 0048 04/20/21 0051  WBC 15.3* 19.9* 16.2* 14.4*  NEUTROABS  --  17.1* 13.0* 11.8*  HGB 8.4* 8.9* 9.0* 10.3*  HCT 27.7* 28.2* 28.9* 32.2*  MCV 101.8* 100.7* 99.3 98.2  PLT 319 351 343 341      Medications:     allopurinol  100 mg Oral Daily   amiodarone  200 mg Oral Daily   apixaban  5 mg Oral BID   Chlorhexidine Gluconate Cloth  6 each Topical Daily   Chlorhexidine Gluconate Cloth  6 each Topical Q0600   lanthanum  500 mg Oral TID WC   midodrine  20 mg Oral TID WC     Elmarie Shiley, MD 04/20/2021, 9:03 AM

## 2021-04-20 NOTE — Progress Notes (Signed)
[facility] for Infectious Disease  Date of Admission:  04/12/2021     Total days of antibiotics 4         ASSESSMENT:  Phillip Young blood cultures drawn on 04/19/21 are pending. TTE without evidence of vegetation. TEE ordered to rule out endocarditis. Source of infection appears to be removed dialysis catheter. Additional imaging has not revealed any other potential sources and offers no specific cause of continued joint pains. Agree with Nephrology plan to replace catheter on 9/27 for continued dialysis. Continue current dose of Cefazolin with dialysis and monitor cultures for clearance of bacteremia. Duration of therapy depending on TEE results. ID will continue to follow.   PLAN:  Continue Cefazolin.  TEE ordered.  Monitor cultures for clearance of bacteremia.  Continue remaining supportive care per Primary Team and Nephrology. ID will continue to follow.   Principal Problem:   Chronic respiratory failure with hypoxia ([name]) Active Problems:   Morbid obesity ([name])   Chronic systolic CHF (congestive heart failure) ([name])   Type 2 diabetes mellitus with diabetic chronic kidney disease ([name])   ESRD (end stage renal disease) ([name])   Paroxysmal atrial flutter ([name])   Hyponatremia   ESRD (end stage renal disease) on dialysis ([name])   MSSA bacteremia    allopurinol  100 mg Oral Daily   amiodarone  200 mg Oral Daily   apixaban  5 mg Oral BID   Chlorhexidine Gluconate Cloth  6 each Topical Daily   Chlorhexidine Gluconate Cloth  6 each Topical Q0600   lanthanum  500 mg Oral TID WC   midodrine  20 mg Oral TID WC    SUBJECTIVE:  Afebrile overnight with no acute events. Continues to have joint pains. Mother at beside and has concern about multiple joint pain.   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  Respiratory:  Positive for shortness of breath. Negative for cough and wheezing.   Cardiovascular:  Negative for chest pain and leg swelling.  Gastrointestinal:  Negative for abdominal pain, constipation, diarrhea, nausea and vomiting.  Musculoskeletal:        Positive for left hip, right knee and left wrist pain.   Skin:  Negative for rash.     OBJECTIVE: Vitals:   04/19/21 1705 04/20/21 0011 04/20/21 0543 04/20/21 0734  BP: 121/72 (!) 97/54 (!) 95/59 107/62  Pulse: 80 82 81 74  Resp: '20 17 20 20  '$ Temp: 98.4 F (36.9 C) 98.7 F (37.1 C) 98.2 F (36.8 C) 97.6 F (36.4 C)  TempSrc: Oral Oral Oral Oral  SpO2: 93% 96% 94% 94%  Weight:      Height:       Body mass index is 58.36 kg/m.  Physical Exam Constitutional:      General: He is not in acute distress.    Appearance: He is obese. He is ill-appearing.     Comments: Lying in bed with head of bed elevated; lethargic/drowsy  Cardiovascular:     Rate and Rhythm: Normal rate and regular rhythm.     Heart sounds: Normal heart sounds.  Pulmonary:     Effort: Pulmonary effort is normal.     Breath sounds: Normal breath sounds.  Skin:  General: Skin is warm and dry.  Neurological:     Mental Status: He is oriented to person, place, and time.  Psychiatric:        Behavior: Behavior normal.        Thought Content: Thought content normal.        Judgment: Judgment normal.    Lab Results Lab Results  Component Value Date   WBC 14.4 (H) 04/20/2021   HGB 10.3 (L) 04/20/2021   HCT 32.2 (L) 04/20/2021   MCV 98.2 04/20/2021   PLT 341 04/20/2021    Lab Results  Component Value Date   CREATININE 5.58 (H) 04/20/2021   BUN 72 (H) 04/20/2021   NA 129 (L) 04/20/2021   K 4.7 04/20/2021   CL 91 (L) 04/20/2021   CO2 22 04/20/2021    Lab Results  Component Value Date   ALT 32 04/12/2021   AST 36 04/12/2021   ALKPHOS 84 04/12/2021   BILITOT 1.2  04/12/2021     Microbiology: Recent Results (from the past 240 hour(s))  Resp Panel by RT-PCR (Flu A&B, Covid) Nasopharyngeal Swab     Status: None   Collection Time: 04/12/21  8:51 PM   Specimen: Nasopharyngeal Swab; Nasopharyngeal(NP) swabs in vial transport medium  Result Value Ref Range Status   SARS Coronavirus 2 by RT PCR NEGATIVE NEGATIVE Final    Comment: (NOTE) SARS-CoV-2 target nucleic acids are NOT DETECTED.  The SARS-CoV-2 RNA is generally detectable in upper respiratory specimens during the acute phase of infection. The lowest concentration of SARS-CoV-2 viral copies this assay can detect is 138 copies/mL. A negative result does not preclude SARS-Cov-2 infection and should not be used as the sole basis for treatment or other patient management decisions. A negative result may occur with  improper specimen collection/handling, submission of specimen other than nasopharyngeal swab, presence of viral mutation(s) within the areas targeted by this assay, and inadequate number of viral copies(<138 copies/mL). A negative result must be combined with clinical observations, patient history, and epidemiological information. The expected result is Negative.  Fact Sheet for Patients:  EntrepreneurPulse.com.au  Fact Sheet for Healthcare Providers:  IncredibleEmployment.be  This test is no t yet approved or cleared by the [place] FDA and  has been authorized for detection and/or diagnosis of SARS-CoV-2 by FDA under an Emergency Use Authorization (EUA). This EUA will remain  in effect (meaning this test can be used) for the duration of the COVID-19 declaration under Section 564(b)(1) of the Act, 21 U.S.C.section 360bbb-3(b)(1), unless the authorization is terminated  or revoked sooner.       Influenza A by PCR NEGATIVE NEGATIVE Final   Influenza B by PCR NEGATIVE NEGATIVE Final    Comment: (NOTE) The Xpert Xpress SARS-CoV-2/FLU/RSV  plus assay is intended as an aid in the diagnosis of influenza from Nasopharyngeal swab specimens and should not be used as a sole basis for treatment. Nasal washings and aspirates are unacceptable for Xpert Xpress SARS-CoV-2/FLU/RSV testing.  Fact Sheet for Patients: EntrepreneurPulse.com.au  Fact Sheet for Healthcare Providers: IncredibleEmployment.be  This test is not yet approved or cleared by the [place] FDA and has been authorized for detection and/or diagnosis of SARS-CoV-2 by FDA under an Emergency Use Authorization (EUA). This EUA will remain in effect (meaning this test can be used) for the duration of the COVID-19 declaration under Section 564(b)(1) of the Act, 21 U.S.C. section 360bbb-3(b)(1), unless the authorization is terminated or revoked.  Performed at [facility], [place]  [address], [place], [place] [address]   MRSA Next Gen by PCR, Nasal     Status: None   Collection Time: 04/13/21  1:02 AM   Specimen: Nasal Mucosa; Nasal Swab  Result Value Ref Range Status   MRSA by PCR Next Gen NOT DETECTED NOT DETECTED Final    Comment: (NOTE) The GeneXpert MRSA Assay (FDA approved for NASAL specimens only), is one component of a comprehensive MRSA colonization surveillance program. It is not intended to diagnose MRSA infection nor to guide or monitor treatment for MRSA infections. Test performance is not FDA approved in patients less than 43 years old. Performed at [facility], [place] [address] [place], [place] [address]   Culture, blood (Routine X 2) w Reflex to ID Panel     Status: Abnormal   Collection Time: 04/16/21  9:42 PM   Specimen: BLOOD  Result Value Ref Range Status   Specimen Description BLOOD BLOOD LEFT ARM  Final   Special Requests   Final    BOTTLES DRAWN AEROBIC AND ANAEROBIC Blood Culture adequate volume   Culture  Setup Time   Final    GRAM POSITIVE COCCI IN BOTH AEROBIC AND ANAEROBIC  BOTTLES CRITICAL RESULT CALLED TO, READ BACK BY AND VERIFIED WITH: PHARMD A.LOWLES AT [id number] ON 04/17/2021 BY T.SAAD. Performed at [facility], [place] [address] [place], [place] [address]    Culture STAPHYLOCOCCUS AUREUS (A)  Final   Report Status 04/19/2021 FINAL  Final   Organism ID, Bacteria STAPHYLOCOCCUS AUREUS  Final      Susceptibility   Staphylococcus aureus - MIC*    CIPROFLOXACIN <=0.5 SENSITIVE Sensitive     ERYTHROMYCIN <=0.25 SENSITIVE Sensitive     GENTAMICIN <=0.5 SENSITIVE Sensitive     OXACILLIN <=0.25 SENSITIVE Sensitive     TETRACYCLINE <=1 SENSITIVE Sensitive     VANCOMYCIN <=0.5 SENSITIVE Sensitive     TRIMETH/SULFA <=10 SENSITIVE Sensitive     CLINDAMYCIN <=0.25 SENSITIVE Sensitive     RIFAMPIN <=0.5 SENSITIVE Sensitive     Inducible Clindamycin NEGATIVE Sensitive     * STAPHYLOCOCCUS AUREUS  Culture, blood (Routine X 2) w Reflex to ID Panel     Status: Abnormal   Collection Time: 04/16/21  9:42 PM   Specimen: BLOOD  Result Value Ref Range Status   Specimen Description BLOOD BLOOD RIGHT ARM  Final   Special Requests   Final    BOTTLES DRAWN AEROBIC AND ANAEROBIC Blood Culture results may not be optimal due to an inadequate volume of blood received in culture bottles   Culture  Setup Time   Final    GRAM POSITIVE COCCI IN BOTH AEROBIC AND ANAEROBIC BOTTLES CRITICAL VALUE NOTED.  VALUE IS CONSISTENT WITH PREVIOUSLY REPORTED AND CALLED VALUE.    Culture (A)  Final    STAPHYLOCOCCUS AUREUS SUSCEPTIBILITIES PERFORMED ON PREVIOUS CULTURE WITHIN THE LAST 5 DAYS. Performed at [facility], [place] [address] [place], [place] [address]    Report Status 04/19/2021 FINAL  Final  Blood Culture ID Panel (Reflexed)     Status: Abnormal   Collection Time: 04/16/21  9:42 PM  Result Value Ref Range Status   Enterococcus faecalis NOT DETECTED NOT DETECTED Final   Enterococcus Faecium NOT DETECTED NOT DETECTED Final   Listeria monocytogenes NOT DETECTED NOT  DETECTED Final   Staphylococcus species DETECTED (A) NOT DETECTED Final    Comment: CRITICAL RESULT CALLED TO, READ BACK BY AND VERIFIED WITH: PHARMD A.LOWLES AT 1121 ON  04/17/2021 BY T.SAAD.    Staphylococcus aureus (BCID) DETECTED (A) NOT DETECTED Final    Comment: CRITICAL RESULT CALLED TO, READ BACK BY AND VERIFIED WITH: PHARMD A.LOWLES AT 1121 ON 04/17/2021 BY T.SAAD.    Staphylococcus epidermidis NOT DETECTED NOT DETECTED Final   Staphylococcus lugdunensis NOT DETECTED NOT DETECTED Final   Streptococcus species NOT DETECTED NOT DETECTED Final   Streptococcus agalactiae NOT DETECTED NOT DETECTED Final   Streptococcus pneumoniae NOT DETECTED NOT DETECTED Final   Streptococcus pyogenes NOT DETECTED NOT DETECTED Final   A.calcoaceticus-baumannii NOT DETECTED NOT DETECTED Final   Bacteroides fragilis NOT DETECTED NOT DETECTED Final   Enterobacterales NOT DETECTED NOT DETECTED Final   Enterobacter cloacae complex NOT DETECTED NOT DETECTED Final   Escherichia coli NOT DETECTED NOT DETECTED Final   Klebsiella aerogenes NOT DETECTED NOT DETECTED Final   Klebsiella oxytoca NOT DETECTED NOT DETECTED Final   Klebsiella pneumoniae NOT DETECTED NOT DETECTED Final   Proteus species NOT DETECTED NOT DETECTED Final   Salmonella species NOT DETECTED NOT DETECTED Final   Serratia marcescens NOT DETECTED NOT DETECTED Final   Haemophilus influenzae NOT DETECTED NOT DETECTED Final   Neisseria meningitidis NOT DETECTED NOT DETECTED Final   Pseudomonas aeruginosa NOT DETECTED NOT DETECTED Final   Stenotrophomonas maltophilia NOT DETECTED NOT DETECTED Final   Candida albicans NOT DETECTED NOT DETECTED Final   Candida auris NOT DETECTED NOT DETECTED Final   Candida glabrata NOT DETECTED NOT DETECTED Final   Candida krusei NOT DETECTED NOT DETECTED Final   Candida parapsilosis NOT DETECTED NOT DETECTED Final   Candida tropicalis NOT DETECTED NOT DETECTED Final   Cryptococcus neoformans/gattii NOT  DETECTED NOT DETECTED Final   Meth resistant mecA/C and MREJ NOT DETECTED NOT DETECTED Final    Comment: Performed at [facility], [address] [place], [place] [address]     Terri Piedra, [facility] Infectious Disease [facility]  04/20/2021  9:14 AM

## 2021-04-20 NOTE — TOC Progression Note (Signed)
H[facility] of Care [facility]) - Progression Note    Patient Details  Name: Phillip Young MRN: [id number] Date of Birth: [birth year]  Transition of Care [facility]) CM/SW Contact  Zenon Mayo, RN Phone Number: 04/20/2021, 4:23 PM  Clinical Narrative:    HF signed off, [employer] is providing home oxygen , triology.  Rollator and portable is in the room.  Pt is rec HHPT.  Will offer choice.    Expected Discharge Plan: Home/Self Care Barriers to Discharge: Continued Medical Work up  Expected Discharge Plan and Services Expected Discharge Plan: Home/Self Care In-house Referral: Clinical Social Work Discharge Planning Services: CM Consult Post Acute Care Choice: Durable Medical Equipment Living arrangements for the past 2 months: Single Family Home                   DME Agency: [employer] Date DME Agency Contacted: 04/14/21 Time DME Agency Contacted: [phone] Representative spoke with at DME Agency: Andree Coss             Social Determinants of Health (SDOH) Interventions Food Insecurity Interventions: Assist with SNAP Application Financial Strain Interventions: Other (Comment) (Referral to [facility]) Housing Interventions: Intervention Not Indicated Transportation Interventions: Intervention Not Indicated  Readmission Risk Interventions Readmission Risk Prevention Plan 10/16/2020 10/16/2020 10/14/2020  Transportation Screening Complete Complete Complete  PCP or Specialist Appt within 5-7 Days Complete Complete Complete  Home Care Screening Complete - -  Medication Review (RN CM) Referral to Pharmacy - -  Some recent data might be hidden

## 2021-04-20 NOTE — Progress Notes (Signed)
Physical Therapy Treatment Patient Details Name: Phillip Young MRN: [id number] DOB: 09/08/1985 Today's Date: 04/20/2021   History of Present Illness 35 y.o. male admitted 9/18 with SOB, sats at 78% and reporting home O2 concentrator not working properly. Pt with recent hospitalization 7/20-9/1 with cardiogenic shock, cardiorenal syndrome which unfortunately progressed to ESRD with need for HD, biventricular heart failure. PMH: CHF, DM 2, a flutter, ESRD on HD TTS.    PT Comments    Patient not progressing towards PT goals today due to pain in left buttock/pelvis and right knee; pain with all/any movement passively or actively. Requires assist of 2 to get to EOB with increased time, preparation and effort. Particular about how he wants to move. Did not attempt standing today due to pain. Pt tearful and crying during session. Became increasingly agitated and verbally inappropriate/abusive towards mother/staff making therapy no longer appropriate. MD and director present in room upon departure. Will follow and progress as pt allows/tolerates.   Recommendations for follow up therapy are one component of a multi-disciplinary discharge planning process, led by the attending physician.  Recommendations may be updated based on patient status, additional functional criteria and insurance authorization.  Follow Up Recommendations  Home health PT;Supervision for mobility/OOB (pending improvement)     Equipment Recommendations  Other (comment) (bari RW)    Recommendations for Other Services       Precautions / Restrictions Precautions Precautions: Other (comment) Precaution Comments: monitor O2 (wears 1 L O2 at home) Restrictions Weight Bearing Restrictions: No     Mobility  Bed Mobility Overal bed mobility: Needs Assistance Bed Mobility: Supine to Sit     Supine to sit: Max assist;+2 for physical assistance;HOB elevated     General bed mobility comments: Increased time/effort, step by  step cues for technique. Assist witih LEs, trunk and scooting bottom to EOB. Wanted to stay sitting EOB post session to eat food.    Transfers                 General transfer comment: Did not attempt this date due to pain and pt getting agitated and verbally abusive/inappropriate towards mother/nurse.  Ambulation/Gait                 Stairs             Wheelchair Mobility    Modified Rankin (Stroke Patients Only)       Balance Overall balance assessment: Needs assistance Sitting-balance support: Feet supported;No upper extremity supported Sitting balance-Leahy Scale: Fair Sitting balance - Comments: required one extremity support for balance initially progressing to no UE support. Any pain withi movement esp in left buttock/pelvis, rightknee.                                    Cognition Arousal/Alertness: Awake/alert Behavior During Therapy: Agitated Overall Cognitive Status: Within Functional Limits for tasks assessed                                 General Comments: Pt crying during session and upset due tio pain. Stating he does not wantt o do this anymore and live like this, does not want dialysis. "this isn't helping me, this is torture." pt became increasingly more agitated using inappropriate language as session went on esp with mother present.      Exercises  General Comments General comments (skin integrity, edema, etc.): SP02 ranging >85% on 4L/min 02 [place]. Mother present for session.      Pertinent Vitals/Pain Pain Assessment: 0-10 Pain Score: 10-Worst pain ever Pain Location: left hip, right knee Pain Descriptors / Indicators: Grimacing;Moaning;Guarding;Constant;Discomfort;Sore Pain Intervention(s): Monitored during session;RN gave pain meds during session;Limited activity within patient's tolerance    Home Living                      Prior Function            PT Goals (current goals can  now be found in the care plan section) Progress towards PT goals: Not progressing toward goals - comment (due to pain, agitation)    Frequency    Min 3X/week      PT Plan Current plan remains appropriate    Co-evaluation              AM-PAC PT "6 Clicks" Mobility   Outcome Measure  Help needed turning from your back to your side while in a flat bed without using bedrails?: A Lot Help needed moving from lying on your back to sitting on the side of a flat bed without using bedrails?: Total Help needed moving to and from a bed to a chair (including a wheelchair)?: A Lot Help needed standing up from a chair using your arms (e.g., wheelchair or bedside chair)?: Total Help needed to walk in hospital room?: A Lot Help needed climbing 3-5 steps with a railing? : Total 6 Click Score: 9    End of Session Equipment Utilized During Treatment: Oxygen Activity Tolerance: Patient limited by pain;Treatment limited secondary to agitation Patient left: in bed;with call bell/phone within reach (sitting EOB with MD and director present) Nurse Communication: Mobility status PT Visit Diagnosis: Difficulty in walking, not elsewhere classified (R26.2);Other abnormalities of gait and mobility (R26.89);Pain Pain - Right/Left: Right Pain - part of body: Knee     Time: [id number] PT Time Calculation (min) (ACUTE ONLY): 46 min  Charges:  $Therapeutic Activity: 23-37 mins $Self Care/Home Management: 8-22                     Zettie Cooley, DPT Acute Rehabilitation Services Pager [phone] Office [phone]      Marguarite Arbour A Sabra Heck 04/20/2021, 4:31 PM

## 2021-04-20 NOTE — Progress Notes (Signed)
    [facility] has been requested to perform a transesophageal echocardiogram on 04/23/21 for bacteremia.  After careful review of history and examination, the risks and benefits of transesophageal echocardiogram have been explained including risks of esophageal damage, perforation (1:10,000 risk), bleeding, pharyngeal hematoma as well as other potential complications associated with conscious sedation including aspiration, arrhythmia, respiratory failure and death. Alternatives to treatment were discussed, questions were answered. Patient is willing to proceed.   TEE scheduled for 04/23/21 at 7:30am with Dr. Gardiner Rhyme.  Roby Lofts, PA-C 04/20/2021 11:26 AM

## 2021-04-20 NOTE — Progress Notes (Signed)
Occupational Therapy Treatment Patient Details Name: Phillip Young MRN: [id number] DOB: 08/20/1985 Today's Date: 04/20/2021   History of present illness 35 y.o. male admitted 9/18 with SOB, sats at 78% and reporting home O2 concentrator not working properly. Pt with recent hospitalization 7/20-9/1 with cardiogenic shock, cardiorenal syndrome which unfortunately progressed to ESRD with need for HD, biventricular heart failure. PMH: CHF, DM 2, a flutter, ESRD on HD TTS.   OT comments  Patient had complaints of left hip pain on this date, greatly affecting performance.  Patient required extra time and assistance to get to eob with assistance for BLEs and trunk.  Patient required frequent assistance for repositioning while on eob and attempted to stand to RW but was unsuccessful due to pain. Patient was returned to supine with assistance from nursing tech.  Acute OT to continue to follow.    Recommendations for follow up therapy are one component of a multi-disciplinary discharge planning process, led by the attending physician.  Recommendations may be updated based on patient status, additional functional criteria and insurance authorization.    Follow Up Recommendations  Home health OT;Supervision - Intermittent    Equipment Recommendations  Other (comment)    Recommendations for Other Services      Precautions / Restrictions Precautions Precautions: Other (comment) Precaution Comments: monitor O2 (wears 1 L O2 at home) Restrictions Weight Bearing Restrictions: No       Mobility Bed Mobility Overal bed mobility: Needs Assistance Bed Mobility: Rolling;Sidelying to Sit;Sit to Supine Rolling: Min assist Sidelying to sit: Max assist;HOB elevated   Sit to supine: Max assist;HOB elevated   General bed mobility comments: patient required extra time and assistance to get to eob due to increase pain    Transfers                      Balance Overall balance assessment: Needs  assistance Sitting-balance support: Feet supported;Single extremity supported Sitting balance-Leahy Scale: Fair Sitting balance - Comments: required one extremity support for balance                                   ADL either performed or assessed with clinical judgement   ADL                                               Vision       Perception     Praxis      Cognition Arousal/Alertness: Awake/alert Behavior During Therapy: WFL for tasks assessed/performed Overall Cognitive Status: Within Functional Limits for tasks assessed                                 General Comments: increased pain with bed mobility        Exercises     Shoulder Instructions       General Comments      Pertinent Vitals/ Pain       Pain Assessment: 0-10 Pain Score: 8  Faces Pain Scale: Hurts whole lot Pain Location:  (left hip) Pain Descriptors / Indicators: Grimacing;Moaning;Guarding Pain Intervention(s): Premedicated before session  Home Living  Prior Functioning/Environment              Frequency  Min 2X/week        Progress Toward Goals  OT Goals(current goals can now be found in the care plan section)  Progress towards OT goals: Progressing toward goals  Acute Rehab OT Goals Patient Stated Goal: to reduce pain and return to independent mobility OT Goal Formulation: With patient Time For Goal Achievement: 04/27/21 Potential to Achieve Goals: Fair ADL Goals Pt Will Perform Grooming: with set-up;standing Pt Will Perform Lower Body Bathing: with min guard assist;sit to/from stand Pt Will Transfer to Toilet: with min guard assist;stand pivot transfer;bedside commode Pt/caregiver will Perform Home Exercise Program: Increased ROM;Left upper extremity;Independently;With written HEP provided  Plan Discharge plan needs to be updated    Co-evaluation                  AM-PAC OT "6 Clicks" Daily Activity     Outcome Measure   Help from another person eating meals?: A Little Help from another person taking care of personal grooming?: A Little Help from another person toileting, which includes using toliet, bedpan, or urinal?: A Lot Help from another person bathing (including washing, rinsing, drying)?: A Lot Help from another person to put on and taking off regular upper body clothing?: A Little Help from another person to put on and taking off regular lower body clothing?: A Lot 6 Click Score: 15    End of Session Equipment Utilized During Treatment: Oxygen  OT Visit Diagnosis: Unsteadiness on feet (R26.81);Other abnormalities of gait and mobility (R26.89);Muscle weakness (generalized) (M62.81);Pain Pain - Right/Left: Left Pain - part of body: Hip   Activity Tolerance Patient limited by pain   Patient Left in bed;with call bell/phone within reach   Nurse Communication Mobility status        Time: [id number] OT Time Calculation (min): 40 min  Charges: OT General Charges $OT Visit: 1 Visit OT Treatments $Therapeutic Activity: 38-52 mins  Lodema Hong, OTA   Manveer Gomes Alexis Goodell 04/20/2021, 8:46 AM

## 2021-04-20 NOTE — Consult Note (Signed)
Chief Complaint: Patient was seen in consultation today for MSSA bacteremia  Referring Physician(s): Dr. Jonnie Finner  Supervising Physician: Sandi Mariscal  Patient Status: Phillip Young - In-pt  History of Present Illness: Phillip Young is a 35 y.o. male with past medical history of biventricular heart failure, PAF on Eliquis, HTN, GERD, hidradenitis who presented to [facility] ED with shortness of breath, fatigue, nausea, vomiting, and knee pain admitted for possible fluid overload and CHF exacerbation.  He was found to have MSSA bacteremia. His HD cath was removed 9/24 for line holiday.  There were signs of infection with purulent drainage upon removal.  Plan made for replacement Tuesday 9/27 in IR.   Patient assessed at bedside.  He complains of hip, knee, and shoulder pain.  He is lying flat at about 30 degrees, on O2 with occasional desats with prolonged conversation.  States frustration with his HD catheter and its limits on his ability to perform normal tasks such as showering, however also states understanding that catheter is needed to resume HD.  He is agreeable to replacement.    Past Medical History:  Diagnosis Date   Biventricular congestive heart failure ([place])    Last Echo 11/2019 at [facility] reveals EF 20%   Class 3 severe obesity due to excess calories with serious comorbidity and body mass index (BMI) of 50.0 to 59.9 in adult [facility]) 02/26/2020   Essential hypertension 02/26/2020   GERD without esophagitis 02/26/2020   Hidradenitis suppurativa 02/26/2020   OSA (obstructive sleep apnea) 02/26/2020   Prediabetes 02/26/2020   Renal disorder     Past Surgical History:  Procedure Laterality Date   ABSCESS DRAINAGE     IR FLUORO GUIDE CV LINE RIGHT  03/10/2021   IR US GUIDE VASC ACCESS RIGHT  03/10/2021   RIGHT HEART CATH N/A 03/06/2021   Procedure: RIGHT HEART CATH;  Surgeon: Jolaine Artist, MD;  Location: [place] [facility];  Service: Cardiovascular;  Laterality: N/A;   RIGHT/LEFT  HEART CATH AND CORONARY ANGIOGRAPHY N/A 03/04/2020   Procedure: RIGHT/LEFT HEART CATH AND CORONARY ANGIOGRAPHY;  Surgeon: Larey Dresser, MD;  Location: [place] [facility];  Service: Cardiovascular;  Laterality: N/A;   TEMPORARY DIALYSIS CATHETER  03/06/2021   Procedure: TEMPORARY DIALYSIS CATHETER;  Surgeon: Jolaine Artist, MD;  Location: [place] [facility];  Service: Cardiovascular;;    Allergies: Coreg [carvedilol] and Heparin  Medications: Prior to Admission medications   Medication Sig Start Date End Date Taking? Authorizing Provider  amiodarone (PACERONE) 200 MG tablet Take 1 tablet (200 mg) by mouth twice a day for 10 days, then take 1 tablet daily. Patient taking differently: Take 200 mg by mouth daily. 03/26/21  Yes Carollo, Farley Ly, NP  apixaban (ELIQUIS) 5 MG TABS tablet Take 1 tablet (5 mg total) by mouth 2 (two) times daily. 03/26/21  Yes Carollo, Farley Ly, NP  lanthanum (FOSRENOL) 500 MG chewable tablet Chew 1 tablet (500 mg total) by mouth 3 (three) times daily with meals. 03/26/21  Yes Carollo, Farley Ly, NP  midodrine (PROAMATINE) 10 MG tablet Take 2 tablets (20 mg total) by mouth 3 (three) times daily with meals. Do NOT hold for dialysis. TAKE DURING DAYLIGHT HOURS ONLY 03/26/21  Yes Carollo, Farley Ly, NP  Semaglutide,0.25 or 0.'5MG'$ /DOS, (OZEMPIC, 0.25 OR 0.5 MG/DOSE,) 2 MG/1.5ML SOPN Inject 0.25 mg into the skin once a week. Increase as tolerated after 4 weeks to 0.5 mg weekly. Patient not taking: Reported on 04/12/2021 04/07/21   [name]  S, MD     Family History  Problem Relation Age of Onset   Heart disease Mother    Hypertension Mother    Pulmonary Hypertension Mother    Drug abuse Father        died due to Heroin overdose    Social History   Socioeconomic History   Marital status: Single    Spouse name: Not on file   Number of children: Not on file   Years of education: Not on file   Highest education level: Not on file  Occupational History   Not on file  Tobacco  Use   Smoking status: Former    Packs/day: 1.00    Types: Cigarettes    Quit date: 2019    Years since quitting: 3.7   Smokeless tobacco: Former   Tobacco comments:    quit in 2019  Substance and Sexual Activity   Alcohol use: Never   Drug use: Never   Sexual activity: Not on file  Other Topics Concern   Not on file  Social History Narrative   Not on file   Social Determinants of Health   Financial Resource Strain: Low Risk    Difficulty of Paying Living Expenses: Not very hard  Food Insecurity: Food Insecurity Present   Worried About [place] in the Last Year: Never true   Ran Out of Food in the Last Year: Sometimes true  Transportation Needs: No Transportation Needs   Lack of Transportation (Medical): No   Lack of Transportation (Non-Medical): No  Physical Activity: Not on file  Stress: Not on file  Social Connections: Not on file     Review of Systems: A 12 point ROS discussed and pertinent positives are indicated in the HPI above.  All other systems are negative.  Review of Systems  Constitutional:  Negative for fatigue and fever.  Respiratory:  Positive for shortness of breath. Negative for cough.   Cardiovascular:  Negative for chest pain.  Gastrointestinal:  Negative for abdominal pain, diarrhea, nausea and vomiting.  Genitourinary:  Negative for dysuria.  Musculoskeletal:  Positive for arthralgias.  Psychiatric/Behavioral:  Negative for behavioral problems and confusion.    Vital Signs: BP 107/62 (BP Location: Right Wrist)   Pulse 74   Temp 97.6 F (36.4 C) (Oral)   Resp 20   Ht '5\' 11"'$  (1.803 m)   Wt (!) 418 lb 6.9 oz (189.8 kg)   SpO2 94%   BMI 58.36 kg/m   Physical Exam Vitals and nursing note reviewed.  Constitutional:      General: He is not in acute distress.    Appearance: He is well-developed. He is not ill-appearing.  Cardiovascular:     Rate and Rhythm: Normal rate and regular rhythm.  Pulmonary:     Effort: Pulmonary effort  is normal.     Breath sounds: Normal breath sounds.  Skin:    General: Skin is warm and dry.     Comments: Dried drainage on dressing.  Unable to express any further purulence from catheter tract.  Neurological:     General: No focal deficit present.     Mental Status: He is alert and oriented to person, place, and time.  Psychiatric:        Mood and Affect: Mood normal.        Behavior: Behavior normal.     MD Evaluation Airway: WNL Heart: WNL Abdomen: WNL Chest/ Lungs: WNL ASA  Classification: 3 Mallampati/Airway Score: Three   Imaging:  DG Chest 2 View  Result Date: 04/12/2021 CLINICAL DATA:  Acute shortness of breath. EXAM: CHEST - 2 VIEW COMPARISON:  03/11/2021 and prior studies FINDINGS: Cardiomegaly and RIGHT IJ central venous catheter again noted. Pulmonary vascular congestion and mild bilateral interstitial opacities are noted. No pleural effusion or pneumothorax noted. No acute bony abnormalities are present. IMPRESSION: Cardiomegaly with pulmonary vascular congestion and mild bilateral interstitial opacities/edema. Electronically Signed   By: Margarette Canada M.D.   On: 04/12/2021 16:45   DG Wrist 2 Views Left  Result Date: 04/17/2021 CLINICAL DATA:  Left wrist pain. EXAM: LEFT WRIST - 2 VIEW COMPARISON:  None. FINDINGS: There is no evidence of fracture or dislocation. There is no evidence of arthropathy or other focal bone abnormality. Soft tissues are unremarkable. IMPRESSION: Negative. Electronically Signed   By: Marijo Conception M.D.   On: 04/17/2021 15:55   CT PELVIS WO CONTRAST  Result Date: 04/19/2021 CLINICAL DATA:  Sepsis, left hip pain, abdominal abscess/infection suspected. EXAM: CT PELVIS WITHOUT CONTRAST TECHNIQUE: Multidetector CT imaging of the pelvis was performed following the standard protocol without intravenous contrast. COMPARISON:  None. FINDINGS: Urinary Tract:  No abnormality visualized. Bowel:  Unremarkable visualized pelvic bowel loops.  Vascular/Lymphatic: No pathologically enlarged lymph nodes. No significant vascular abnormality seen. Reproductive:  Prostate gland unremarkable. Other: Tiny fat containing umbilical and small fat containing right inguinal hernias are present. Musculoskeletal: Osseous structures are unremarkable. Hip joint spaces are preserved. Sacroiliac joint spaces are preserved. Periarticular soft tissues of the left hip are unremarkable. IMPRESSION: No acute abnormality identified. No radiographic explanation for the patient's reported symptoms. Electronically Signed   By: Fidela Salisbury M.D.   On: 04/19/2021 02:43   CT HIP RIGHT W CONTRAST  Result Date: 04/16/2021 CLINICAL DATA:  Right hip pain, limping.  No known injury. EXAM: CT OF THE LOWER RIGHT EXTREMITY WITH CONTRAST TECHNIQUE: Multidetector CT imaging of the lower right extremity was performed according to the standard protocol following intravenous contrast administration. CONTRAST:  161m OMNIPAQUE IOHEXOL 350 MG/ML SOLN COMPARISON:  02/19/2020 FINDINGS: Bones/Joint/Cartilage No acute fracture. No dislocation. No evidence of femoral head avascular necrosis. Visualized portion of the right hemipelvis is intact. Pubic symphysis and right SI joint intact without diastasis. No lytic or sclerotic bony lesions. No erosion or periosteal elevation. Mild joint space narrowing of the right hip. No appreciable hip joint effusion. Ligaments Suboptimally assessed by CT. Muscles and Tendons No acute musculotendinous abnormality by CT. No intramuscular fluid collection. No appreciable bursal fluid collection. Soft tissues No soft tissue edema or fluid collections. No right inguinal lymphadenopathy is seen. No acute findings are evident within the visualized right hemipelvis. IMPRESSION: 1. No acute osseous abnormality of the right hip. 2. Mild degenerative changes of the right hip. Electronically Signed   By: NDavina PokeD.O.   On: 04/16/2021 11:27   DG Knee Complete 4  Views Right  Result Date: 04/12/2021 CLINICAL DATA:  Knee pain.  Shortness of breath. EXAM: RIGHT KNEE - COMPLETE 4+ VIEW COMPARISON:  None. FINDINGS: No evidence of fracture, dislocation, or joint effusion. No evidence of arthropathy or other focal bone abnormality. Soft tissues are unremarkable. IMPRESSION: Negative. Electronically Signed   By: DDorise BullionIII M.D.   On: 04/12/2021 16:44   VAS [place]OWER EXTREMITY VENOUS (DVT)  Result Date: 04/18/2021  Lower Venous DVT Study Patient Name:  Phillip Young Date of Exam:   04/17/2021 Medical Rec #: [id number]      Accession #:  [id number] Date of Birth: Nov 13, 1985        Patient Gender: M Patient Age:   17 years Exam Location:  [facility] Procedure:      VAS [place] LOWER EXTREMITY VENOUS (DVT) Referring Phys: PRANAV PATEL --------------------------------------------------------------------------------  Indications: Edema, and tenderness.  Limitations: Body habitus and poor ultrasound/tissue interface. Comparison Study: no prior Performing Technologist: Archie Patten RVS  Examination Guidelines: A complete evaluation includes B-mode imaging, spectral Doppler, color Doppler, and power Doppler as needed of all accessible portions of each vessel. Bilateral testing is considered an integral part of a complete examination. Limited examinations for reoccurring indications may be performed as noted. The reflux portion of the exam is performed with the patient in reverse Trendelenburg.  +-----+---------------+---------+-----------+----------+--------------+ RIGHTCompressibilityPhasicitySpontaneityPropertiesThrombus Aging +-----+---------------+---------+-----------+----------+--------------+ CFV  Full           Yes      Yes                                 +-----+---------------+---------+-----------+----------+--------------+   +---------+---------------+---------+-----------+----------+-------------------+ LEFT      CompressibilityPhasicitySpontaneityPropertiesThrombus Aging      +---------+---------------+---------+-----------+----------+-------------------+ CFV      Full           Yes      Yes                                      +---------+---------------+---------+-----------+----------+-------------------+ SFJ      Full                                                             +---------+---------------+---------+-----------+----------+-------------------+ FV Prox  Full                                                             +---------+---------------+---------+-----------+----------+-------------------+ FV Mid   Full                                                             +---------+---------------+---------+-----------+----------+-------------------+ FV Distal               Yes      Yes                                      +---------+---------------+---------+-----------+----------+-------------------+ PFV      Full                                                             +---------+---------------+---------+-----------+----------+-------------------+ POP      Full  Yes      Yes                                      +---------+---------------+---------+-----------+----------+-------------------+ PTV      Full                                                             +---------+---------------+---------+-----------+----------+-------------------+ PERO                                                  Not well visualized +---------+---------------+---------+-----------+----------+-------------------+    Summary: RIGHT: - No evidence of common femoral vein obstruction.  LEFT: - There is no evidence of deep vein thrombosis in the lower extremity. However, portions of this examination were limited- see technologist comments above.  - No cystic structure found in the popliteal fossa.  *See table(s) above for measurements and  observations. Electronically signed by Servando Snare MD on 04/18/2021 at 5:21:30 PM.    Final    ECHOCARDIOGRAM LIMITED  Result Date: 04/18/2021    ECHOCARDIOGRAM LIMITED REPORT   Patient Name:   Phillip Young Date of Exam: 04/18/2021 Medical Rec #:  [id number]      Height:       71.0 in Accession #:    [id number]     Weight:       418.4 lb Date of Birth:  04/03/1986       BSA:          2.886 m Patient Age:    18 years       BP:           103/80 mmHg Patient Gender: M              HR:           79 bpm. Exam Location:  Inpatient Procedure: Limited Color Doppler, Cardiac Doppler and Limited Echo Indications:    bacteremia  History:        Patient has prior history of Echocardiogram examinations, most                 recent 02/12/2021. CHF, end stage renal disease,                 Signs/Symptoms:Bacteremia; Risk Factors:Diabetes, Hypertension                 and Sleep Apnea.  Sonographer:    Johny Chess RDCS Referring Phys: [id number] [place]  1. Left ventricular ejection fraction, by estimation, is 25 to 30%. The left ventricle has severely decreased function. The left ventricle demonstrates global hypokinesis. The left ventricular internal cavity size was moderately dilated. Left ventricular diastolic parameters are indeterminate.  2. Right ventricular systolic function is normal. The right ventricular size is normal. There is moderately elevated pulmonary artery systolic pressure. The estimated right ventricular systolic pressure is 0000000 mmHg.  3. The mitral valve is normal in structure. Trivial mitral valve regurgitation. No evidence of mitral stenosis.  4. The aortic valve is normal in structure. Aortic valve regurgitation is not visualized. No  aortic stenosis is present.  5. The inferior vena cava is normal in size with greater than 50% respiratory variability, suggesting right atrial pressure of 3 mmHg. Comparison(s): The left ventricular function has improved.  Conclusion(s)/Recommendation(s): No evidence of valvular vegetations on this transthoracic echocardiogram. Would recommend a transesophageal echocardiogram to exclude infective endocarditis if clinically indicated. FINDINGS  Left Ventricle: Left ventricular ejection fraction, by estimation, is 25 to 30%. The left ventricle has severely decreased function. The left ventricle demonstrates global hypokinesis. The left ventricular internal cavity size was moderately dilated. There is no left ventricular hypertrophy. Left ventricular diastolic parameters are indeterminate. Right Ventricle: The right ventricular size is normal. No increase in right ventricular wall thickness. Right ventricular systolic function is normal. There is moderately elevated pulmonary artery systolic pressure. The tricuspid regurgitant velocity is 3.31 m/s, and with an assumed right atrial pressure of 15 mmHg, the estimated right ventricular systolic pressure is 0000000 mmHg. Left Atrium: Left atrial size was normal in size. Right Atrium: Right atrial size was normal in size. Pericardium: There is no evidence of pericardial effusion. Mitral Valve: The mitral valve is normal in structure. Trivial mitral valve regurgitation. No evidence of mitral valve stenosis. Tricuspid Valve: The tricuspid valve is normal in structure. Tricuspid valve regurgitation is mild . No evidence of tricuspid stenosis. Aortic Valve: The aortic valve is normal in structure. Aortic valve regurgitation is not visualized. No aortic stenosis is present. Pulmonic Valve: The pulmonic valve was normal in structure. Pulmonic valve regurgitation is not visualized. No evidence of pulmonic stenosis. Aorta: The aortic root is normal in size and structure. Venous: The inferior vena cava is normal in size with greater than 50% respiratory variability, suggesting right atrial pressure of 3 mmHg. IAS/Shunts: No atrial level shunt detected by color flow Doppler. LEFT VENTRICLE PLAX 2D LVIDd:          7.10 cm  Diastology LVIDs:         6.10 cm  LV e' medial:  7.07 cm/s LV PW:         1.30 cm  LV e' lateral: 10.80 cm/s LV IVS:        0.90 cm LVOT diam:     2.20 cm LV SV:         68 LV SV Index:   23 LVOT Area:     3.80 cm  IVC IVC diam: 2.50 cm LEFT ATRIUM         Index LA diam:    4.50 cm 1.56 cm/m  AORTIC VALVE LVOT Vmax:   103.00 cm/s LVOT Vmean:  66.700 cm/s LVOT VTI:    0.178 m MITRAL VALVE               TRICUSPID VALVE MV Area (PHT): 5.27 cm    TR Peak grad:   43.8 mmHg MV Decel Time: 144 msec    TR Vmax:        331.00 cm/s MV A velocity: 58.00 cm/s                            SHUNTS                            Systemic VTI:  0.18 m                            Systemic Diam: 2.20 cm  Candee Furbish MD Electronically signed by Candee Furbish MD Signature Date/Time: 04/18/2021/3:15:32 PM    Final    CT MAXILLOFACIAL WO CONTRAST  Result Date: 04/19/2021 CLINICAL DATA:  Maxillofacial pain EXAM: CT MAXILLOFACIAL WITHOUT CONTRAST TECHNIQUE: Multidetector CT imaging of the maxillofacial structures was performed. Multiplanar CT image reconstructions were also generated. COMPARISON:  None. FINDINGS: Osseous: No acute facial fracture. No mandibular dislocation. Extensive periodontal disease with numerous absent dentition, extensive dental caries, and periapical lucencies involving the residual maxillary molars. Orbits: Negative. No traumatic or inflammatory finding. Sinuses: There is mild mucosal thickening within the a maxillary sinuses bilaterally in keeping with mild paranasal sinus disease. No air-fluid levels. Remaining paranasal sinuses are clear. Soft tissues: Negative. Limited intracranial: No significant or unexpected finding. IMPRESSION: No acute abnormality identified.  Extensive periodontal disease. Electronically Signed   By: Fidela Salisbury M.D.   On: 04/19/2021 02:34    Labs:  CBC: Recent Labs    04/16/21 1526 04/18/21 0030 04/19/21 0048 04/20/21 0051  WBC 15.3* 19.9* 16.2* 14.4*  HGB 8.4*  8.9* 9.0* 10.3*  HCT 27.7* 28.2* 28.9* 32.2*  PLT 319 351 343 341    COAGS: Recent Labs    02/24/21 1052 02/25/21 0419 03/14/21 0437 03/15/21 0430 03/16/21 0408 03/17/21 0449 03/23/21 1528  INR 1.4*  --   --   --   --   --  1.3*  APTT  --    < > 59* 62* 65* 63*  --    < > = values in this interval not displayed.    BMP: Recent Labs    04/16/21 1526 04/18/21 0030 04/19/21 0048 04/20/21 0051  NA 127* 128* 130* 129*  K 4.6 6.2* 4.7 4.7  CL 87* 90* 90* 91*  CO2 '25 25 24 22  '$ GLUCOSE 143* 126* 112* 107*  BUN 65* 61* 52* 72*  CALCIUM 8.7* 9.9 9.5 9.8  CREATININE 7.87* 5.72* 5.17* 5.58*  GFRNONAA 8* 12* 14* 13*    LIVER FUNCTION TESTS: Recent Labs    02/11/21 1208 02/12/21 0413 02/15/21 1600 04/01/21 1240 04/12/21 1555 04/14/21 0831 04/16/21 1526 04/18/21 0030 04/19/21 0048 04/20/21 0051  BILITOT 2.1* 1.9*  --  0.7 1.2  --   --   --   --   --   AST 44* 35  --  21 36  --   --   --   --   --   ALT 52* 47*  --  24 32  --   --   --   --   --   ALKPHOS 68 69  --  108 84  --   --   --   --   --   PROT 7.7 7.9  --  8.1 8.5*  --   --   --   --   --   ALBUMIN 3.4* 3.4*   < > 3.3* 3.3*   < > 2.9* 2.8* 2.6* 2.6*   < > = values in this interval not displayed.    TUMOR MARKERS: No results for input(s): AFPTM, CEA, CA199, CHROMGRNA in the last 8760 hours.  Assessment and Plan: Renal failure/dysfunction Patient with renal impairment, fluid overload, dialysis dependent via R IJ tunneled dialysis catheter last placed in IR 03/10/21.  Catheter removed 9/24 due to MSSA bacteremia.  Patient will need replacement of tunneled dialysis catheter 9/27.  NPO p MN.  He is on Eliquis.  Risks and benefits discussed with the patient including, but not limited to bleeding,  infection, vascular injury, pneumothorax which may require chest tube placement, air embolism or even death  All of the patient's questions were answered, patient is agreeable to proceed. Consent signed and in  chart.  Thank you for this interesting consult.  I greatly enjoyed meeting Phillip Young and look forward to participating in their care.  A copy of this report was sent to the requesting provider on this date.  Electronically Signed: Docia Barrier, PA 04/20/2021, 11:14 AM   I spent a total of 40 Minutes    in face to face in clinical consultation, greater than 50% of which was counseling/coordinating care for renal dysfunction.

## 2021-04-21 ENCOUNTER — Inpatient Hospital Stay (HOSPITAL_COMMUNITY): Payer: BC Managed Care – PPO

## 2021-04-21 ENCOUNTER — Telehealth: Payer: Self-pay

## 2021-04-21 DIAGNOSIS — I5023 Acute on chronic systolic (congestive) heart failure: Secondary | ICD-10-CM | POA: Diagnosis not present

## 2021-04-21 DIAGNOSIS — J9601 Acute respiratory failure with hypoxia: Secondary | ICD-10-CM

## 2021-04-21 DIAGNOSIS — G4733 Obstructive sleep apnea (adult) (pediatric): Secondary | ICD-10-CM

## 2021-04-21 DIAGNOSIS — I509 Heart failure, unspecified: Secondary | ICD-10-CM

## 2021-04-21 DIAGNOSIS — J9621 Acute and chronic respiratory failure with hypoxia: Secondary | ICD-10-CM

## 2021-04-21 DIAGNOSIS — Z7189 Other specified counseling: Secondary | ICD-10-CM

## 2021-04-21 DIAGNOSIS — Z9119 Patient's noncompliance with other medical treatment and regimen: Secondary | ICD-10-CM

## 2021-04-21 DIAGNOSIS — Z515 Encounter for palliative care: Secondary | ICD-10-CM

## 2021-04-21 DIAGNOSIS — E662 Morbid (severe) obesity with alveolar hypoventilation: Secondary | ICD-10-CM

## 2021-04-21 DIAGNOSIS — J9611 Chronic respiratory failure with hypoxia: Secondary | ICD-10-CM | POA: Diagnosis not present

## 2021-04-21 DIAGNOSIS — Z91199 Patient's noncompliance with other medical treatment and regimen due to unspecified reason: Secondary | ICD-10-CM

## 2021-04-21 DIAGNOSIS — R7881 Bacteremia: Secondary | ICD-10-CM | POA: Diagnosis not present

## 2021-04-21 DIAGNOSIS — N186 End stage renal disease: Secondary | ICD-10-CM | POA: Diagnosis not present

## 2021-04-21 DIAGNOSIS — I959 Hypotension, unspecified: Secondary | ICD-10-CM | POA: Diagnosis present

## 2021-04-21 HISTORY — DX: Acute and chronic respiratory failure with hypoxia: J96.21

## 2021-04-21 IMAGING — DX DG CHEST 1V PORT
1 series · 1 of 1 positions shown · non-contrast
Comparison: [DATE]

CLINICAL DATA: Shortness of breath and respiratory failure.

EXAM:
PORTABLE CHEST 1 VIEW

[chest ap]
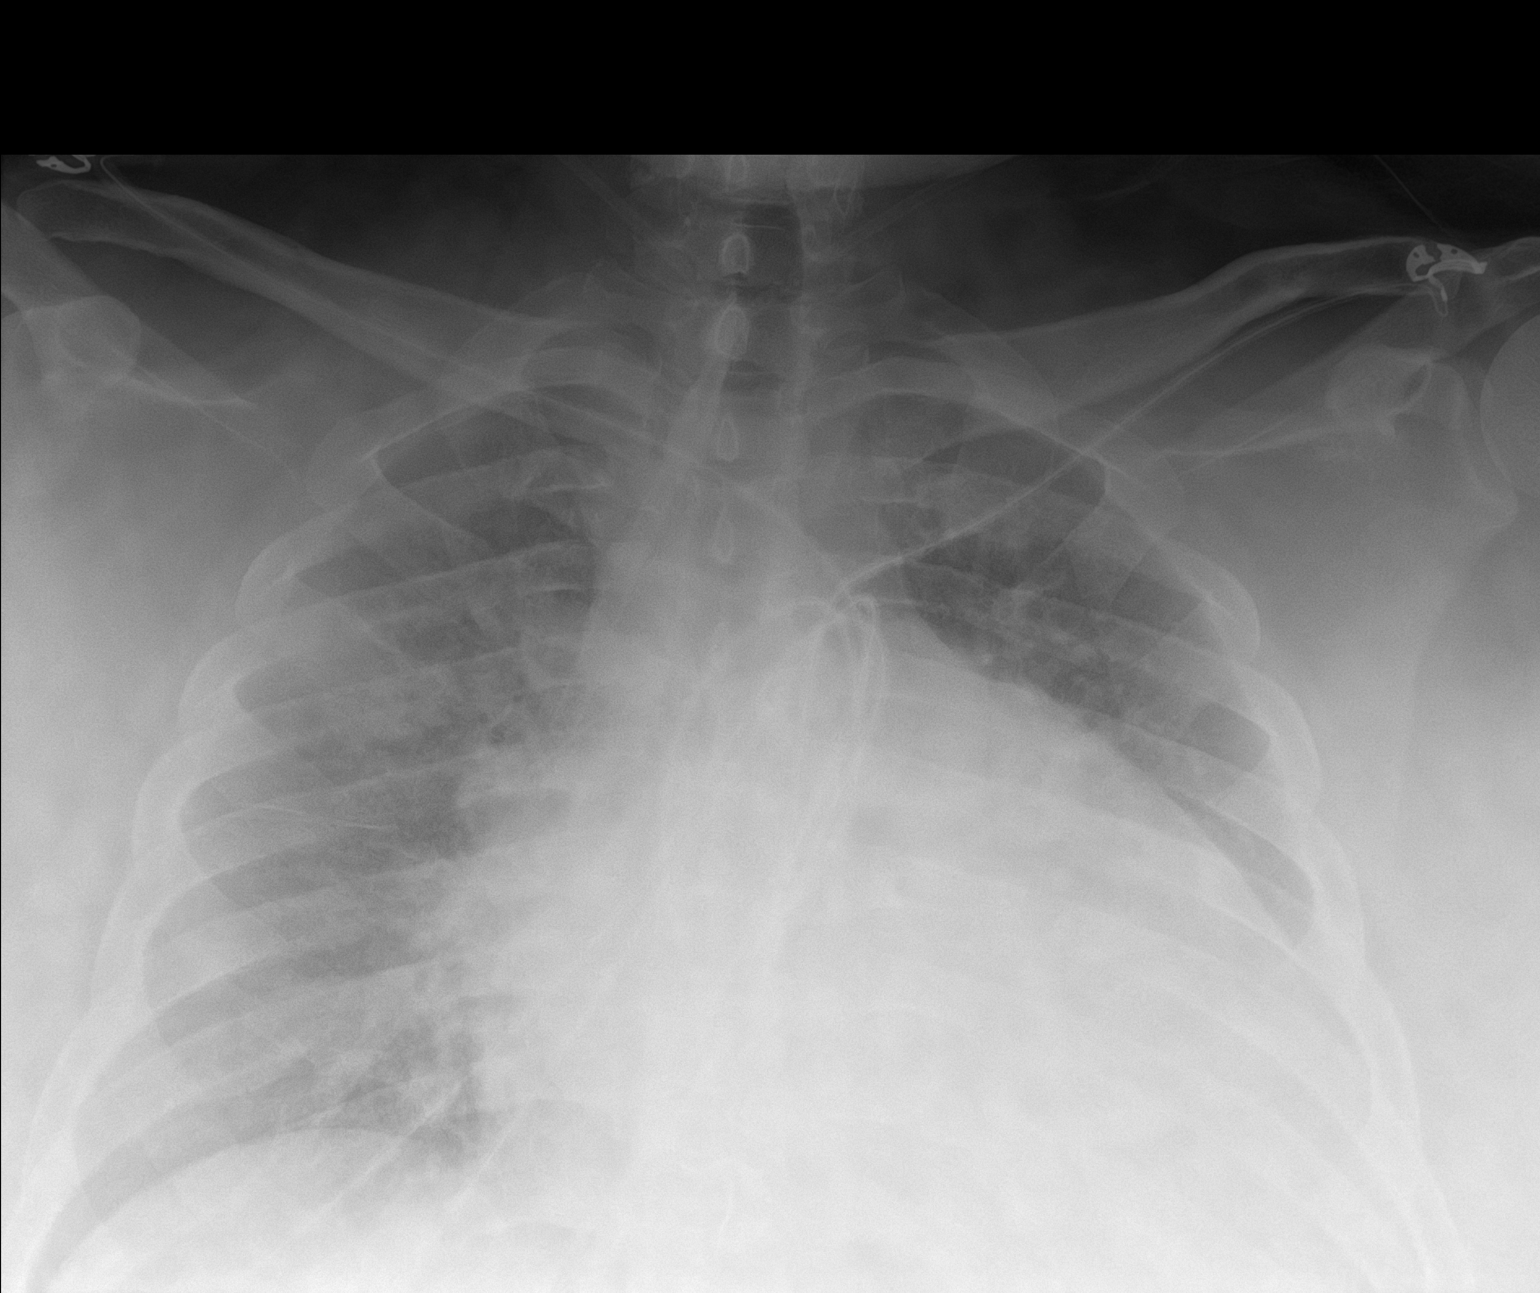

[1 of 1 positions shown; findings below may reference images not displayed]

FINDINGS: Cardiac enlargement unchanged from previous exam. Low lung volumes.
Pulmonary vascular congestion is again identified. New asymmetric
opacity is identified within the right upper lobe which may reflect
pneumonia. Atelectasis noted within the lung bases.
IMPRESSION: 1. New asymmetric opacity within the right upper lobe may reflect
pneumonia.
2. Stable cardiac enlargement and pulmonary vascular congestion.

## 2021-04-21 MED ORDER — SENNA 8.6 MG PO TABS
2.0000 | ORAL_TABLET | Freq: Two times a day (BID) | ORAL | Status: DC
  Administered 2021-04-21: 17.2 mg via ORAL
  Filled 2021-04-21: qty 2

## 2021-04-21 MED ORDER — ANTICOAGULANT SODIUM CITRATE 4% (200MG/5ML) IV SOLN
10.0000 mL | Freq: Once | Status: AC
  Administered 2021-04-30: 10 mL
  Filled 2021-04-21 (×3): qty 10

## 2021-04-21 MED ORDER — PREDNISONE 20 MG PO TABS
40.0000 mg | ORAL_TABLET | Freq: Every day | ORAL | Status: AC
  Administered 2021-04-21 – 2021-04-25 (×4): 40 mg via ORAL
  Filled 2021-04-21 (×4): qty 2

## 2021-04-21 MED ORDER — FUROSEMIDE 10 MG/ML IJ SOLN: 40.0000 mg | Freq: Once | INTRAMUSCULAR | Status: DC

## 2021-04-21 MED ORDER — OXYCODONE HCL 5 MG PO TABS
15.0000 mg | ORAL_TABLET | Freq: Four times a day (QID) | ORAL | Status: DC | PRN
  Administered 2021-04-21 – 2021-05-07 (×26): 15 mg via ORAL
  Filled 2021-04-21 (×33): qty 3

## 2021-04-21 NOTE — Assessment & Plan Note (Signed)
On 4 LPM at home. Now on 10 LPM. PCCM consulted.  Pt refuses Lasix, refused placement of IR HD catheter today, has been refusing Bipap here frequently.  Also requesting to increase his pain medication while being drowsy.  Non compliance with fluid restriction as well.  Pt has been education at length by multiple providers regarding compliance with medical regimen and risk of intubation, Not a candidate for ICD per heart failure team.

## 2021-04-21 NOTE — Assessment & Plan Note (Signed)
On midodrine, frequently non compliant with the med.

## 2021-04-21 NOTE — Progress Notes (Signed)
Patient ID: Phillip Young, male   DOB: [birth year], 35 y.o.   MRN: [id number]  [facility] Progress Note   Assessment/ Plan:   MSSA bacteremia -likely catheter related bacteremia with positive blood cultures on 9/22 prompting removal of hemodialysis catheter on 9/24 due to exit site purulence to facilitate catheter holiday and clearance of bacteremia while on antibiotic therapy and with plans to originally to replace hemodialysis catheter today that he declined and will be reattempted again tomorrow at the discretion of interventional radiology after we attempt pain control. Fatigue/ multi-joint pain -suspect that this is osteoarthritis with need to rule out septic arthritis with bacteremia (imaging limited by his inability to lay still).  Will increase oxycodone to 15 mg 3 times a day and start prednisone 40 mg daily for the next 5 days Shortness of breath-reportedly his O2 equipment ran out or malfunctioned.  He unfortunately displays continued nonadherence with nocturnal CPAP and reeducation continues.  CCM consulted.  ESRD - on HD TTS.  Currently on catheter holiday to facilitate clearance of bacteremia and with plans for hemodialysis catheter placement again tomorrow to resume dialysis. Volume/hypertension-control of inter-dialytic weight gain has been a struggle and will be enforced rigorously here while in the hospital.  History of biventricular congestive heart failure-end-stage and not a candidate for assist device or transplant. Hypotension - on midodrine 20 tid Atrial fibrillation -appears rate controlled and is on Eliquis for anticoagulation.  Remains on amiodarone. CKD MBD-continue to reinforce renal diet along with phosphorus binder/adherence, elevated phosphorus level noted. Anemia of chronic kidney disease: Without overt blood loss, hemoglobin and hematocrit improving.  Subjective:   Declined hemodialysis catheter placement and verbally insulted IR staff-I informed him  that this was unacceptable regardless of how much pain he was in.  He again was nonadherent to CPAP overnight and was angry about n.p.o. status after midnight trying to convince night staff to give him something to drink.  He complains of intolerable hip pain and stiffness of his right knee.   Objective:   BP 111/64 (BP Location: Right Wrist)   Pulse 71   Temp 98.2 F (36.8 C) (Oral)   Resp (!) 24   Ht '5\' 11"'$  (1.803 m)   Wt (!) 189.8 kg   SpO2 93%   BMI 58.36 kg/m   Physical Exam: Gen: Morbidly obese man who appears comfortable resting flat in bed initially on CPAP and switched to nasal cannula to allow conversation. CVS: Pulse regular rhythm, normal rate, S1 and S2 normal Resp: Anteriorly clear to auscultation without any rales or rhonchi, no wheeze Abd: Soft, obese, nontender, bowel sounds normal Ext: 1+ lower extremity edema  Labs: BMET Recent Labs  Lab 04/16/21 1526 04/18/21 0030 04/19/21 0048 04/20/21 0051  NA 127* 128* 130* 129*  K 4.6 6.2* 4.7 4.7  CL 87* 90* 90* 91*  CO2 '25 25 24 22  '$ GLUCOSE 143* 126* 112* 107*  BUN 65* 61* 52* 72*  CREATININE 7.87* 5.72* 5.17* 5.58*  CALCIUM 8.7* 9.9 9.5 9.8  PHOS 8.8* 8.4* 6.8* 8.1*   CBC Recent Labs  Lab 04/16/21 1526 04/18/21 0030 04/19/21 0048 04/20/21 0051  WBC 15.3* 19.9* 16.2* 14.4*  NEUTROABS  --  17.1* 13.0* 11.8*  HGB 8.4* 8.9* 9.0* 10.3*  HCT 27.7* 28.2* 28.9* 32.2*  MCV 101.8* 100.7* 99.3 98.2  PLT 319 351 343 341      Medications:     allopurinol  100 mg Oral Daily   amiodarone  200 mg Oral Daily  apixaban  5 mg Oral BID   Chlorhexidine Gluconate Cloth  6 each Topical Daily   Chlorhexidine Gluconate Cloth  6 each Topical Q0600   colchicine  0.6 mg Oral Q72H   lanthanum  500 mg Oral TID WC   midodrine  20 mg Oral TID WC   Elmarie Shiley, MD 04/21/2021, 3:24 PM

## 2021-04-21 NOTE — Progress Notes (Signed)
Pt refused BIPAP for tonight. Rt will cont to monitor.

## 2021-04-21 NOTE — Progress Notes (Signed)
I attempted to obtain blood gas 2X unsuccessfully. I called to charge RT to see if he could try. RN notified.

## 2021-04-21 NOTE — Assessment & Plan Note (Signed)
4 out of 4 bottles positive for MSSA.  Blood cultures performed on 9/22. Repeat Blood culture negative.  ID consulted. Echocardiogram negative for any vegetation.  TEE ordered, not sure if he is a good candidate with his current respiratory status. On IV cefazolin. Line holiday completed.

## 2021-04-21 NOTE — Progress Notes (Signed)
Called by RRT covering area to see if I could get an ABG on patient. Patient let me attempt one time, but I was unable to get.

## 2021-04-21 NOTE — Assessment & Plan Note (Addendum)
Currently in sinus rhythm on amiodarone. Therapeutic to Eliquis.

## 2021-04-21 NOTE — Consult Note (Signed)
NAME:  Phillip Young, MRN:  [id number], DOB:  1986/04/03, LOS: 5 ADMISSION DATE:  04/12/2021, CONSULTATION DATE: 04/21/2021 REFERRING MD: [facility], CHIEF COMPLAINT: Acute on chronic hypoxic hypercarbic respiratory failure  History of Present Illness:  [name] is a 35 year old male who is morbidly obese at greater than 400 pounds who has documented obstructive sleep apnea and is poorly compliant with his BiPAP at home.  He also has a documented heart failure with a EF of 25%.  Noted to have a recent MSSA bacteremia and currently is on line holiday for his end-stage renal disease.  Plan is to put a new tunneled catheter resume today 04/21/2021.  Patient was instructed to be more compliant with BiPAP he says he is but with staff reports he is not.  Pulmonary critical care will continue to follow if he continues to be noncompliant most likely will end up on the ventilator and in the intensive care unit.  Pertinent  Medical History   Past Medical History:  Diagnosis Date   Biventricular congestive heart failure ([place])    Last Echo 11/2019 at [facility] reveals EF 20%   Class 3 severe obesity due to excess calories with serious comorbidity and body mass index (BMI) of 50.0 to 59.9 in adult [facility]) 02/26/2020   Essential hypertension 02/26/2020   GERD without esophagitis 02/26/2020   Hidradenitis suppurativa 02/26/2020   OSA (obstructive sleep apnea) 02/26/2020   Prediabetes 02/26/2020   Renal disorder      Significant Hospital Events: Including procedures, antibiotic start and stop dates in addition to other pertinent events   04/21/2021 pulmonary consult  Interim History / Subjective:  35 year old male who is on line holiday with known end-stage renal disease plan is for tunneled catheter today.  Objective   Blood pressure 124/77, pulse 74, temperature 98.2 F (36.8 C), temperature source Oral, resp. rate 19, height '5\' 11"'$  (1.803 m), weight (!) 189.8 kg, SpO2 100 %.        Intake/Output  Summary (Last 24 hours) at 04/21/2021 1125 Last data filed at 04/21/2021 0900 Gross per 24 hour  Intake 826 ml  Output 875 ml  Net -49 ml   Filed Weights   04/13/21 0043 04/16/21 1431 04/16/21 1913  Weight: (!) 188 kg (!) 194.6 kg (!) 189.8 kg    Examination: General: Morbid obese male in no acute distress at rest HENT: Short neck no obvious JVD is Lungs: Decreased breath sounds throughout Cardiovascular: Sounds are irregular Abdomen: Obese soft Extremities: 1+ edema Neuro: Strange affect but answers questions appropriately GU: Does not void  Resolved Hospital Problem list     Assessment & Plan:  Acute on chronic hypercarbic hypoxic respiratory failure complicated by noncompliance of obstructive sleep apnea with CPAP further complicated by congestive heart failure with an EF of 25%. O2 to keep sats greater than 88% Needs to wear his CPAP at night and he was instructed to wear CPAP to which he replied he does.  The nurses noted during the night he takes his CPAP mask off and will not replace it.  Bacteremia with staph aureus.MSSA Antimicrobial therapy in place Dialysis catheter has been removed Plan for new dialysis catheter today 04/21/2021  End-stage renal disease Nephrology managing New HD catheter to be placed today  Morbid obesity.  Last documented weight 429 pound Weight loss  Other issues per primary  Best Practice (right click and "Reselect all SmartList Selections" daily)  Per primary  Labs   CBC: Recent Labs  Lab 04/16/21 1526  04/18/21 0030 04/19/21 0048 04/20/21 0051  WBC 15.3* 19.9* 16.2* 14.4*  NEUTROABS  --  17.1* 13.0* 11.8*  HGB 8.4* 8.9* 9.0* 10.3*  HCT 27.7* 28.2* 28.9* 32.2*  MCV 101.8* 100.7* 99.3 98.2  PLT 319 351 343 [id number]    Basic Metabolic Panel: Recent Labs  Lab 04/16/21 1526 04/18/21 0030 04/19/21 0048 04/20/21 0051  NA 127* 128* 130* 129*  K 4.6 6.2* 4.7 4.7  CL 87* 90* 90* 91*  CO2 '25 25 24 22  '$ GLUCOSE 143* 126* 112* 107*   BUN 65* 61* 52* 72*  CREATININE 7.87* 5.72* 5.17* 5.58*  CALCIUM 8.7* 9.9 9.5 9.8  PHOS 8.8* 8.4* 6.8* 8.1*   GFR: Estimated Creatinine Clearance: 31.3 mL/min (A) (by C-G formula based on SCr of 5.58 mg/dL (H)). Recent Labs  Lab 04/16/21 1526 04/18/21 0030 04/19/21 0048 04/20/21 0051  WBC 15.3* 19.9* 16.2* 14.4*    Liver Function Tests: Recent Labs  Lab 04/16/21 1526 04/18/21 0030 04/19/21 0048 04/20/21 0051  ALBUMIN 2.9* 2.8* 2.6* 2.6*   No results for input(s): LIPASE, AMYLASE in the last 168 hours. No results for input(s): AMMONIA in the last 168 hours.  ABG    Component Value Date/Time   PHART 7.344 (L) 03/05/2020 1123   PCO2ART 73.1 (HH) 03/05/2020 1123   PO2ART 69.8 (L) 03/05/2020 1123   HCO3 26.1 03/09/2021 2211   TCO2 28 03/09/2021 2211   ACIDBASEDEF 1.0 03/09/2021 2211   O2SAT 48.8 03/26/2021 0423     Coagulation Profile: No results for input(s): INR, PROTIME in the last 168 hours.  Cardiac Enzymes: No results for input(s): CKTOTAL, CKMB, CKMBINDEX, TROPONINI in the last 168 hours.  HbA1C: Hgb A1c MFr Bld  Date/Time Value Ref Range Status  02/11/2021 09:00 PM 6.5 (H) 4.8 - 5.6 % Final    Comment:    (NOTE) Pre diabetes:          5.7%-6.4%  Diabetes:              >6.4%  Glycemic control for   <7.0% adults with diabetes   10/02/2020 10:15 AM 6.1 (H) 4.8 - 5.6 % Final    Comment:    (NOTE) Pre diabetes:          5.7%-6.4%  Diabetes:              >6.4%  Glycemic control for   <7.0% adults with diabetes     CBG: No results for input(s): GLUCAP in the last 168 hours.  Review of Systems:   10 point review of system taken, please see HPI for positives and negatives.   Past Medical History:  He,  has a past medical history of Biventricular congestive heart failure ([place]), Class 3 severe obesity due to excess calories with serious comorbidity and body mass index (BMI) of 50.0 to 59.9 in adult [facility]) (02/26/2020), Essential hypertension  (02/26/2020), GERD without esophagitis (02/26/2020), Hidradenitis suppurativa (02/26/2020), OSA (obstructive sleep apnea) (02/26/2020), Prediabetes (02/26/2020), and Renal disorder.   Surgical History:   Past Surgical History:  Procedure Laterality Date   ABSCESS DRAINAGE     IR FLUORO GUIDE CV LINE RIGHT  03/10/2021   IR US GUIDE VASC ACCESS RIGHT  03/10/2021   RIGHT HEART CATH N/A 03/06/2021   Procedure: RIGHT HEART CATH;  Surgeon: Jolaine Artist, MD;  Location: [place] [facility];  Service: Cardiovascular;  Laterality: N/A;   RIGHT/LEFT HEART CATH AND CORONARY ANGIOGRAPHY N/A 03/04/2020   Procedure: RIGHT/LEFT HEART CATH AND CORONARY  ANGIOGRAPHY;  Surgeon: Larey Dresser, MD;  Location: [place] [facility];  Service: Cardiovascular;  Laterality: N/A;   TEMPORARY DIALYSIS CATHETER  03/06/2021   Procedure: TEMPORARY DIALYSIS CATHETER;  Surgeon: Jolaine Artist, MD;  Location: [place] [facility];  Service: Cardiovascular;;     Social History:   reports that he quit smoking about 3 years ago. His smoking use included cigarettes. He smoked an average of 1 pack per day. He has quit using smokeless tobacco. He reports that he does not drink alcohol and does not use drugs.   Family History:  His family history includes Drug abuse in his father; Heart disease in his mother; Hypertension in his mother; Pulmonary Hypertension in his mother.   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 Authorizing Provider  amiodarone (PACERONE) 200 MG tablet Take 1 tablet (200 mg) by mouth twice a day for 10 days, then take 1 tablet daily. Patient taking differently: Take 200 mg by mouth daily. 03/26/21  Yes Carollo, Farley Ly, NP  apixaban (ELIQUIS) 5 MG TABS tablet  Take 1 tablet (5 mg total) by mouth 2 (two) times daily. 03/26/21  Yes Carollo, Farley Ly, NP  lanthanum (FOSRENOL) 500 MG chewable tablet Chew 1 tablet (500 mg total) by mouth 3 (three) times daily with meals. 03/26/21  Yes Carollo, Farley Ly, NP  midodrine (PROAMATINE) 10 MG tablet Take 2 tablets (20 mg total) by mouth 3 (three) times daily with meals. Do NOT hold for dialysis. TAKE DURING DAYLIGHT HOURS ONLY 03/26/21  Yes Carollo, Farley Ly, NP  Semaglutide,0.25 or 0.'5MG'$ /DOS, (OZEMPIC, 0.25 OR 0.5 MG/DOSE,) 2 MG/1.5ML SOPN Inject 0.25 mg into the skin once a week. Increase as tolerated after 4 weeks to 0.5 mg weekly. Patient not taking: Reported on 04/12/2021 04/07/21   Larey Dresser, MD     Critical care time: 30 min    Richardson Landry Rhydian Baldi ACNP Acute Care Nurse Practitioner [place] Please consult Amion 04/21/2021, 11:25 AM

## 2021-04-21 NOTE — Assessment & Plan Note (Signed)
Nephrology consult appreciate assistance. IR consulted. HD catheter removed.   New catheter insertion was scheduled on 9/27, pt refused. Pt will need to be placed back on IR or VVS schedule for line placement.  On midodrine 20 mg 3 times a day.

## 2021-04-21 NOTE — Progress Notes (Addendum)
Notified by primary RN that patient had refused to go to IR for HD cath placement. Accompanied MD to bedside to meet with patient. Plan of care discussed, the importance of HD cath placement discussed also the need for an ABG and CXR. Patient adamantly refused IV lasix MD recommended, so lasix not ordered. Patient voiced understanding of the plan stated above - IR called and informed patient was willing to proceed. Will get patient as soon as they can. Primary RN updated.

## 2021-04-21 NOTE — Progress Notes (Addendum)
[facility] Progress Note  Patient: Phillip Young    [id number]  DOA: 04/12/2021     Date of Service: the patient was seen and examined on 04/21/2021  Brief hospital course: Past medical history of combined systolic and diastolic biventricular failure, type II DM, PAF on Eliquis, ESRD on HD, recent admission for cardiogenic shock and cardiorenal syndrome.  Presents to the hospital for shortness of breath and joint pains. Found to have MSSA bacteremia. ID, nephrology consulted Currently plan is clarify GOC and place HD catheter, monitor improvement in hypoxia.  Subjective: reports pain, ongoing issue with compliance, request pain meds. No nausea no vomiting,   Assessment and Plan: * MSSA bacteremia 4 out of 4 bottles positive for MSSA.   Blood cultures performed on 9/22. Repeat Blood culture negative.  ID consulted. Echocardiogram negative for any vegetation.  TEE ordered, not sure if he is a good candidate with his current respiratory status. On IV cefazolin. Line holiday completed.  Acute on chronic respiratory failure with hypoxia ([name]) On 4 LPM at home. Now on 10 LPM. PCCM consulted.  Pt refuses Lasix, refused placement of IR HD catheter today, has been refusing Bipap here frequently.  Also requesting to increase his pain medication while being drowsy.  Non compliance with fluid restriction as well.  Pt has been education at length by multiple providers regarding compliance with medical regimen and risk of intubation, Not a candidate for ICD per heart failure team.   ESRD (end stage renal disease) on dialysis [facility]) Nephrology consult appreciate assistance. IR consulted. HD catheter removed.   New catheter insertion was scheduled on 9/27, pt refused. Pt will need to be placed back on IR or VVS schedule for line placement.  On midodrine 20 mg 3 times a day.  Medical non-compliance Patient refusing midodrine.  Patient refusing to wear BiPAP.  Patient frustrated and  sometimes verbally abusive to the staff with regards to his fluid restriction. Explained to patient multiple times with regards to importance of this medical intervention for his long-term health and care. Palliative care consulted for goals of care conversations.   Chronic systolic CHF (congestive heart failure) ([name]) Management of fluid with HD once HD catheter is inserted. Pt refuses lasix. Thinking that lasix is responsible for his renal dysfunction.   OSA (obstructive sleep apnea) Non compliant with bipap here, at risk of ICU admission.   Hypotension On midodrine, frequently non compliant with the med.   Morbid obesity ([place]) Body mass index is 58.36 kg/m.  Placing the pt at higher risk of poor outcomes.   Hyponatremia Management per Nephrology with HD  Paroxysmal atrial flutter ([name]) Currently in sinus rhythm on amiodarone. Therapeutic to Eliquis.    Body mass index is 58.36 kg/m.   DVT Prophylaxis:    apixaban (ELIQUIS) tablet 5 mg    Pt is Full code.  Communication: no family was present at bedside, at the time of interview.  Data Reviewed: I have personally reviewed and interpreted daily labs, tele strips, imaging.   Physical Exam:  General: Appear in moderate distress, no Rash; Oral Mucosa Clear, moist. no Abnormal Neck Mass Or lumps, Conjunctiva normal  Cardiovascular: S1 and S2 Present, no Murmur, Respiratory: increased respiratory effort, Bilateral Air entry present and bilateral crackles, no wheezes Abdomen: Bowel Sound present, Soft and no tenderness Extremities: bilateral Pedal edema Neurology: alert and oriented to time, place, and person affect emotionally labile. no new focal deficit Gait not checked due to patient safety concerns  Vitals:  04/21/21 1150 04/21/21 1415 04/21/21 1650 04/21/21 1908  BP: 111/64  115/72   Pulse: 78 71 83   Resp: 20 (!) 24 20   Temp: 98.2 F (36.8 C)  98.3 F (36.8 C)   TempSrc: Oral  Oral Oral  SpO2: 95% 93%  94%   Weight:      Height:        Disposition:  Status is: Inpatient  Remains inpatient appropriate because:Hemodynamically unstable  Dispo: The patient is from: Home              Anticipated d/c is to: to be determined               Patient currently is not medically stable to d/c.   Difficult to place patient No        Time spent: 35 minutes. I reviewed all nursing notes, pharmacy notes, vitals, pertinent old records. I have discussed plan of care as described above with RN.  Author: Berle Mull, MD [facility] 04/21/2021 7:40 PM  To reach On-call, see care teams to locate the attending and reach out via [email or web address]. Between 7PM-7AM, please contact night-coverage If you still have difficulty reaching the attending provider, please page the [facility] (Director on Call) for [facility] on amion for assistance.

## 2021-04-21 NOTE — Progress Notes (Signed)
Patient asking for something to drink at this hour. The NT and this RN told the patient that he is not allowed to eat or drink anything until his procedure. This RN had informed the patient multiple times last night that he wont be able to eat or drink anything after midnight and the patient had verbalized understanding.  Being told he is not allowed to drink anything, patient tells that he ate graham cracker at 3 am which he had "stashed". Upon asking for the empty packing of the cracker, patient refused to show them and consistently asking for something to drink since he ate already. Patient blaming the staffs that "we did not wake him up before midnight to allow him to eat and drink".  At midnight, this RN had cleared the bedside table and there were no graham cracker packets on the table. Patient still kept NPO per order.

## 2021-04-21 NOTE — Progress Notes (Signed)
Transport up to bring pt down for HD cath placement. Pt refusing to come down. [name] nurse to call MD to notify of pt's refusal.

## 2021-04-21 NOTE — Assessment & Plan Note (Signed)
Management of fluid with HD once HD catheter is inserted. Pt refuses lasix. Thinking that lasix is responsible for his renal dysfunction.

## 2021-04-21 NOTE — Assessment & Plan Note (Signed)
Body mass index is 58.36 kg/m.  Placing the pt at higher risk of poor outcomes.

## 2021-04-21 NOTE — Assessment & Plan Note (Signed)
Blood pressure was rather low due to chronic hypotension

## 2021-04-21 NOTE — Consult Note (Addendum)
Consultation Note Date: 04/21/2021   Patient Name: Phillip Young  DOB: [birth year]  MRN: [id number]  Age / Sex: 35 y.o., male  PCP: [name], MD Referring Physician: Lavina Hamman, MD  Reason for Consultation: Establishing goals of care  HPI/Patient Profile: 35 y.o. male  with past medical history of severe biventricular CHF with EF < 20%, DM type 2, paroxysmal atrial flutter on Eliquis, OSA, morbid obesity, and ESRD on HD. He presented to the emergency department on 04/12/2021 with shortness of breath for the past 5-6 days. He noted his O2 saturation to be 78% at home, and was questioning whether his home O2 concentrator was working properly. O2 saturation improved as soon as EMS placed him on oxygen. In the ED, chest x-ray showed pulmonary edema. BNP 658. He was admitted to [facility] with chronic respiratory failure.  On 04/16/21, he was found to have MSSA bacteremia.  Patient was hospitalized 02/11/21 - 03/26/21 with cardiogenic shock and cardiorenal syndrome with progressed to ESRD. He initially required CRRT, but was discharged on TTS dialysis.     Clinical Assessment and Goals of Care: Chart reviewed. Patient needs placement of a new tunneled HD catheter. I have noted that he refused this procedure today (on 2 attempts). Also note that he refused IV lasix.  I met at bedside with patient  to discuss diagnosis, prognosis, GOC, EOL wishes, disposition, and options. He is known to Palliative medicine from his recent hospitalization. I re-introduced Palliative Medicine as specialized medical care for people living with serious illness. It focuses on providing relief from the symptoms and stress of a serious illness.   We discussed a brief life review of the patient. He is originally from [place], [place]. 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   As far  as functional status, Marquise states he was doing "well" prior to admission. He states he was driving himself to dialysis.  He states he was not having any issues tolerating dialysis.  Bedside RN states that patient currently requires assistance for all care and it usually takes 4-5 staff members.   We discussed his current illness and what it means in the larger context of his ongoing co-morbidities.  Natural disease trajectory of advanced heart failure was discussed, emphasizing that it is a non-curable and progressive disease. Hardy seems to think IV lasix caused his kidney failure. I provided education that his kidney failure is directly caused by his heart failure.   I attempted to elicit values and goals of care important to the patient. His goal is to "get better and go back to work". I gently stated this was not likely a realistic goal given his current medical condition.   Zaedyn reports that he was unable to tolerate the procedure today due to excruciating pain in his hip. We did discuss that his options for pain medication are limited due to ESRD and severe heart failure.   The patient is interested in all full scope medical  interventions offered. We did discuss code status. Encouraged patient to consider DNR/DNI status understanding evidenced based poor outcomes in similar hospitalized patients, as the cause of the arrest is likely associated with chronic/terminal disease rather than a reversible acute cardio-pulmonary event. I provided education this his prognosis would be poor in the event of cardiopulmonary arrest, but he is clear in his desire to continue full code status.   Primary decision maker: patient. He indicates his health care agent would be his mother.    SUMMARY OF RECOMMENDATIONS   Full code full scope Agree with oxycodone IR 15 mg every 6 hours as needed for pain Would consider giving a low dose of IV Fentanyl (50 mcg) 15 minutes prior to procedure Bowel regimen -  start sennakot 2 tablets twice daily  PMT will continue to follow   Code Status/Advance Care Planning: Full code  Palliative Prophylaxis:  Frequent Pain Assessment and Turn Reposition  Additional Recommendations (Limitations, Scope, Preferences): Full Scope Treatment  Prognosis:  Unable to determine  Discharge Planning: To Be Determined      Primary Diagnoses: Present on Admission:  Type 2 diabetes mellitus with diabetic chronic kidney disease ([place])  ESRD (end stage renal disease) ([name])  Paroxysmal atrial flutter ([name])  (Resolved) Acute on chronic systolic CHF (congestive heart failure) ([name])  Hyponatremia  Chronic systolic CHF (congestive heart failure) ([place])  Chronic respiratory failure with hypoxia ([place])   I have reviewed the medical record, interviewed the patient and family, and examined the patient. The following aspects are pertinent.  Past Medical History:  Diagnosis Date   Biventricular congestive heart failure ([place])    Last Echo 11/2019 at [facility] reveals EF 20%   Class 3 severe obesity due to excess calories with serious comorbidity and body mass index (BMI) of 50.0 to 59.9 in adult [facility]) 02/26/2020   Essential hypertension 02/26/2020   GERD without esophagitis 02/26/2020   Hidradenitis suppurativa 02/26/2020   OSA (obstructive sleep apnea) 02/26/2020   Prediabetes 02/26/2020   Renal disorder      Family History  Problem Relation Age of Onset   Heart disease Mother    Hypertension Mother    Pulmonary Hypertension Mother    Drug abuse Father        died due to Heroin overdose   Scheduled Meds:  allopurinol  100 mg Oral Daily   amiodarone  200 mg Oral Daily   apixaban  5 mg Oral BID   Chlorhexidine Gluconate Cloth  6 each Topical Daily   Chlorhexidine Gluconate Cloth  6 each Topical Q0600   lanthanum  500 mg Oral TID WC   midodrine  20 mg Oral TID WC   predniSONE  40 mg Oral Q breakfast   Continuous Infusions:  anticoagulant sodium citrate      anticoagulant sodium citrate      ceFAZolin (ANCEF) IV 1 g (04/20/21 1423)   PRN Meds:.acetaminophen **OR** acetaminophen, anticoagulant sodium citrate, cyclobenzaprine, ipratropium-albuterol, ondansetron **OR** ondansetron (ZOFRAN) IV, oxyCODONE    Allergies  Allergen Reactions   Coreg [Carvedilol] Shortness Of Breath and Diarrhea    Wheezing    Heparin Other (See Comments)    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  Musculoskeletal:        Joint pain   Physical Exam Vitals reviewed.  Constitutional:      Appearance: He is morbidly obese.     Comments: Chronically ill-appearing  Cardiovascular:  Rate and Rhythm: Normal rate.  Pulmonary:     Effort: Pulmonary effort is normal.  Neurological:     Mental Status: He is alert and oriented to person, place, and time.     Motor: Weakness present.    Vital Signs: BP 111/64 (BP Location: Right Wrist)   Pulse 71   Temp 98.2 F (36.8 C) (Oral)   Resp (!) 24   Ht $R'5\' 11"'wM$  (1.803 m)   Wt (!) 189.8 kg   SpO2 93%   BMI 58.36 kg/m  Pain Scale: 0-10 POSS *See Group Information*: 1-Acceptable,Awake and alert Pain Score: 0-No pain   SpO2: SpO2: 93 % O2 Device:SpO2: 93 % O2 Flow Rate: .O2 Flow Rate (L/min): 8 L/min  IO: Intake/output summary:  Intake/Output Summary (Last 24 hours) at 04/21/2021 1552 Last data filed at 04/21/2021 1323 Gross per 24 hour  Intake 1070 ml  Output 700 ml  Net 370 ml    LBM: Last BM Date: 04/12/21 Baseline Weight: Weight: (!) 189.4 kg Most recent weight: Weight: (!) 189.8 kg      Palliative Assessment/Data: PPS 30%     Time In: 1650 Time Out: 1802 Time Total: 72 minutes Greater than 50%  of this time was spent counseling and coordinating care related to the above assessment and plan.  Signed by: Lavena Bullion, NP   Please contact Palliative Medicine Team phone at [phone] for questions and concerns.   For individual provider: See Shea Evans

## 2021-04-21 NOTE — Assessment & Plan Note (Signed)
Management per Nephrology with HD

## 2021-04-21 NOTE — Progress Notes (Signed)
Pt brought down to IR for tunneled HD catheter placement. Once pt was moved to IR table pt started yelling he did not want procedure done. Pt informed of need for catheter and he stated he understands need for catheter but he still wanted to be moved back to his bed and taken back upstairs to his room. Pt stated he is ok with dying and he does not want procedure done. Pts nurse was notified of pts refusal.

## 2021-04-21 NOTE — TOC Progression Note (Addendum)
Transition of Care [facility]) - Progression Note    Patient Details  Name: Phillip Young MRN: [id number] Date of Birth: May 29, 1986  Transition of [facility]) CM/SW Contact  Zenon Mayo, RN Phone Number: 04/21/2021, 10:33 AM  Clinical Narrative:    NCM offered choice to patient for HHPT/HHOT, he states he does not have a preference. NCM made referral to [facility] with [facility], she states she should be able to take this referral , she will let this NCM know. Awaiting call back. Hoyle Sauer is able to take referral . Soc will begin 24 to 48 hrs post dc.   Expected Discharge Plan: Home/Self Care Barriers to Discharge: Continued Medical Work up  Expected Discharge Plan and Services Expected Discharge Plan: Home/Self Care In-house Referral: Clinical Social Work Discharge Planning Services: CM Consult Post Acute Care Choice: Durable Medical Equipment Living arrangements for the past 2 months: Single Family Home                   DME Agency: [employer] Date DME Agency Contacted: 04/14/21 Time DME Agency Contacted: [phone] Representative spoke with at DME Agency: Andree Coss             Social Determinants of Health (SDOH) Interventions Food Insecurity Interventions: Assist with SNAP Application Financial Strain Interventions: Other (Comment) (Referral to [facility]) Housing Interventions: Intervention Not Indicated Transportation Interventions: Intervention Not Indicated  Readmission Risk Interventions Readmission Risk Prevention Plan 10/16/2020 10/16/2020 10/14/2020  Transportation Screening Complete Complete Complete  PCP or Specialist Appt within 5-7 Days Complete Complete Complete  Home Care Screening Complete - -  Medication Review (RN CM) Referral to Pharmacy - -  Some recent data might be hidden

## 2021-04-21 NOTE — Assessment & Plan Note (Signed)
Patient refusing midodrine.  Patient refusing to wear BiPAP.  Patient frustrated and sometimes verbally abusive to the staff with regards to his fluid restriction. Explained to patient multiple times with regards to importance of this medical intervention for his long-term health and care. Palliative care consulted for goals of care conversations.

## 2021-04-21 NOTE — Assessment & Plan Note (Signed)
Non compliant with bipap here, at risk of ICU admission.

## 2021-04-21 NOTE — Telephone Encounter (Signed)
-----   Message from Freada Bergeron, [place] sent at 04/17/2021 10:09 AM EDT ----- Regarding: referral Hello Monaca Wadas please send referral. Thanks ----- Message ----- From: Sueanne Margarita, MD Sent: 04/16/2021   9:48 PM EDT To: Larey Dresser, MD, Freada Bergeron, CMA  Please refer patient to pulmonary to follow this  Traci ----- Message ----- From: Freada Bergeron, CMA Sent: 04/16/2021   2:59 PM EDT To: Sueanne Margarita, MD  No, it is not a Bipap or anything,not a trach just has 02 and the noninvasive ventilator. The RT at [facility] manages his vent. ----- Message ----- From: Sueanne Margarita, MD Sent: 04/14/2021   8:46 AM EDT To: Larey Dresser, MD, Freada Bergeron, CMA  [facility] you say noninvasive ventilator is it a BiPAP ----- Message ----- From: Freada Bergeron, CMA Sent: 04/13/2021   5:15 PM EDT To: Larey Dresser, MD, Sueanne Margarita, MD  Reached out to the patient and he states no one manages his ventilator. Gae Bon  ----- Message ----- From: Sueanne Margarita, MD Sent: 04/13/2021   4:09 PM EDT To: Larey Dresser, MD, Freada Bergeron, CMA  Who follows that ----- Message ----- From: Freada Bergeron, CMA Sent: 04/13/2021   4:05 PM EDT To: Larey Dresser, MD, Sueanne Margarita, MD  Reached out to [facility] and they state the patient does not have a sleep study on file and he has a non-invasive Ventilator.   ----- Message ----- From: Sueanne Margarita, MD Sent: 04/05/2021  10:34 PM EDT To: Larey Dresser, MD, Freada Bergeron, CMA  Gae Bon  Can you check into this  I have checked everywhere in Epic I know where to look including Care Everywhere and there is no sleep study.  There is no pending order and no canceled or no shows for a sleep study Traci ----- Message ----- From: Larey Dresser, MD Sent: 04/02/2021   2:43 PM EDT To: Sueanne Margarita, MD  Traci, could you check into this patient's CPAP? He says that he is supposed to be getting it and says he had a sleep study, but I  cannot find where.

## 2021-04-22 ENCOUNTER — Inpatient Hospital Stay (HOSPITAL_COMMUNITY): Payer: BC Managed Care – PPO

## 2021-04-22 ENCOUNTER — Encounter (HOSPITAL_COMMUNITY): Payer: Self-pay | Admitting: Internal Medicine

## 2021-04-22 DIAGNOSIS — I509 Heart failure, unspecified: Secondary | ICD-10-CM | POA: Diagnosis not present

## 2021-04-22 DIAGNOSIS — K5900 Constipation, unspecified: Secondary | ICD-10-CM

## 2021-04-22 DIAGNOSIS — J9611 Chronic respiratory failure with hypoxia: Secondary | ICD-10-CM | POA: Diagnosis not present

## 2021-04-22 DIAGNOSIS — J9601 Acute respiratory failure with hypoxia: Secondary | ICD-10-CM | POA: Diagnosis not present

## 2021-04-22 DIAGNOSIS — B9561 Methicillin susceptible Staphylococcus aureus infection as the cause of diseases classified elsewhere: Secondary | ICD-10-CM | POA: Diagnosis not present

## 2021-04-22 DIAGNOSIS — R7881 Bacteremia: Secondary | ICD-10-CM | POA: Diagnosis not present

## 2021-04-22 DIAGNOSIS — N186 End stage renal disease: Secondary | ICD-10-CM | POA: Diagnosis not present

## 2021-04-22 HISTORY — PX: IR US GUIDE VASC ACCESS RIGHT: IMG2390

## 2021-04-22 HISTORY — PX: IR FLUORO GUIDE CV LINE RIGHT: IMG2283

## 2021-04-22 LAB — CBC
HCT: 28.8 % — ABNORMAL LOW (ref 39.0–52.0)
Hemoglobin: 9 g/dL — ABNORMAL LOW (ref 13.0–17.0)
MCH: 30.2 pg (ref 26.0–34.0)
MCHC: 31.3 g/dL (ref 30.0–36.0)
MCV: 96.6 fL (ref 80.0–100.0)
Platelets: 374 10*3/uL (ref 150–400)
RBC: 2.98 MIL/uL — ABNORMAL LOW (ref 4.22–5.81)
RDW: 18.5 % — ABNORMAL HIGH (ref 11.5–15.5)
WBC: 14.7 10*3/uL — ABNORMAL HIGH (ref 4.0–10.5)
nRBC: 0.3 % — ABNORMAL HIGH (ref 0.0–0.2)

## 2021-04-22 LAB — POTASSIUM: Potassium: 6.2 mmol/L — ABNORMAL HIGH (ref 3.5–5.1)

## 2021-04-22 LAB — RENAL FUNCTION PANEL
Albumin: 2.6 g/dL — ABNORMAL LOW (ref 3.5–5.0)
Anion gap: 13 (ref 5–15)
BUN: 93 mg/dL — ABNORMAL HIGH (ref 6–20)
CO2: 24 mmol/L (ref 22–32)
Calcium: 10 mg/dL (ref 8.9–10.3)
Chloride: 91 mmol/L — ABNORMAL LOW (ref 98–111)
Creatinine, Ser: 5.06 mg/dL — ABNORMAL HIGH (ref 0.61–1.24)
GFR, Estimated: 14 mL/min — ABNORMAL LOW (ref >60)
Glucose, Bld: 165 mg/dL — ABNORMAL HIGH (ref 70–99)
Phosphorus: 9.3 mg/dL — ABNORMAL HIGH (ref 2.5–4.6)
Potassium: 6.6 mmol/L (ref 3.5–5.1)
Sodium: 128 mmol/L — ABNORMAL LOW (ref 135–145)

## 2021-04-22 IMAGING — XA IR FLUORO GUIDE CV LINE*R*
2 series · 4 of 4 positions shown · non-contrast
Comparison: none

Addendum:
INDICATION: 35-year-old with end-stage renal disease and needs new dialysis
access. Patient had a catheter SHAKIRU due to infection.

[Series 1: ir fluoro guide cv line*right* · 1 of 1 slices shown]
[im 1/1]
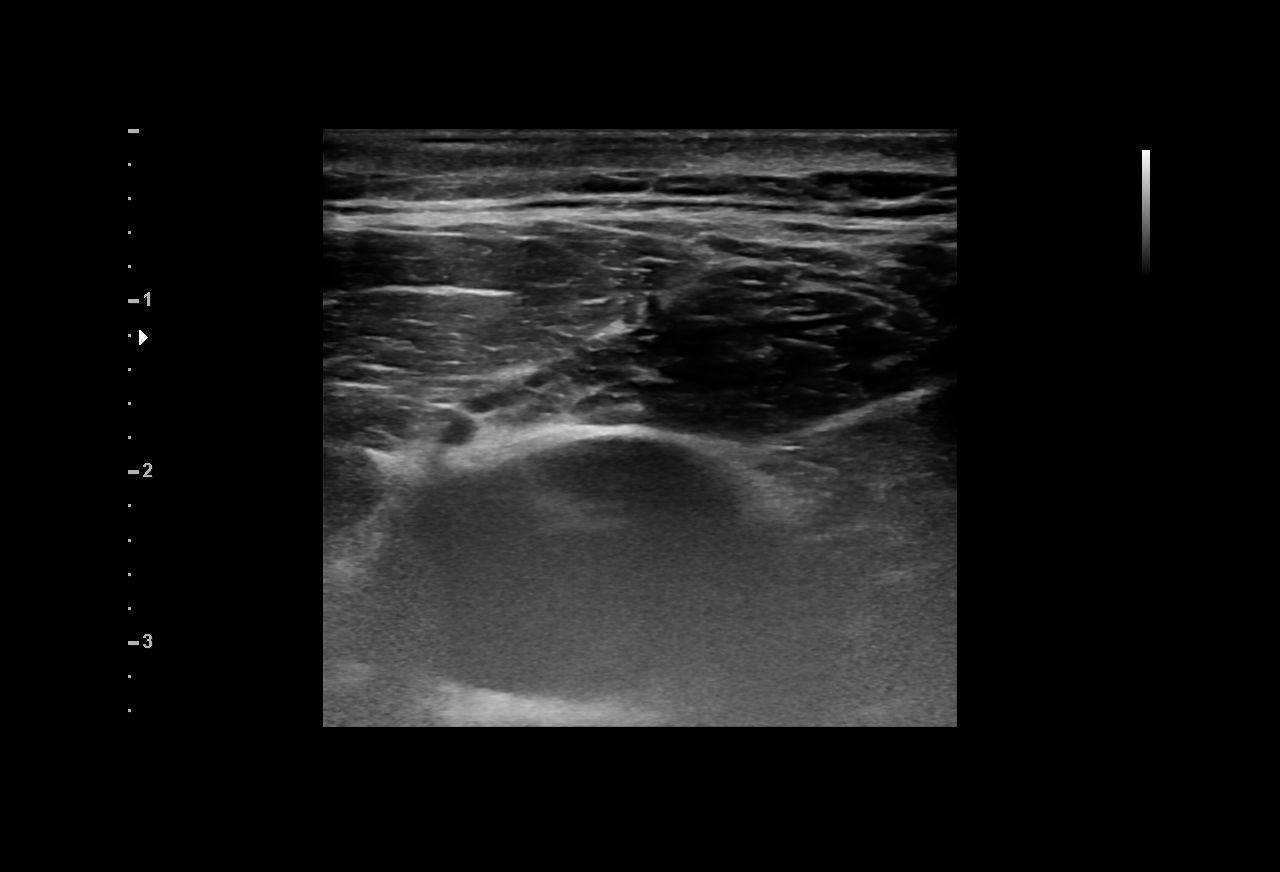

[Series 1: fl (-) angio · 3 of 3 slices shown]
[im 1/3]
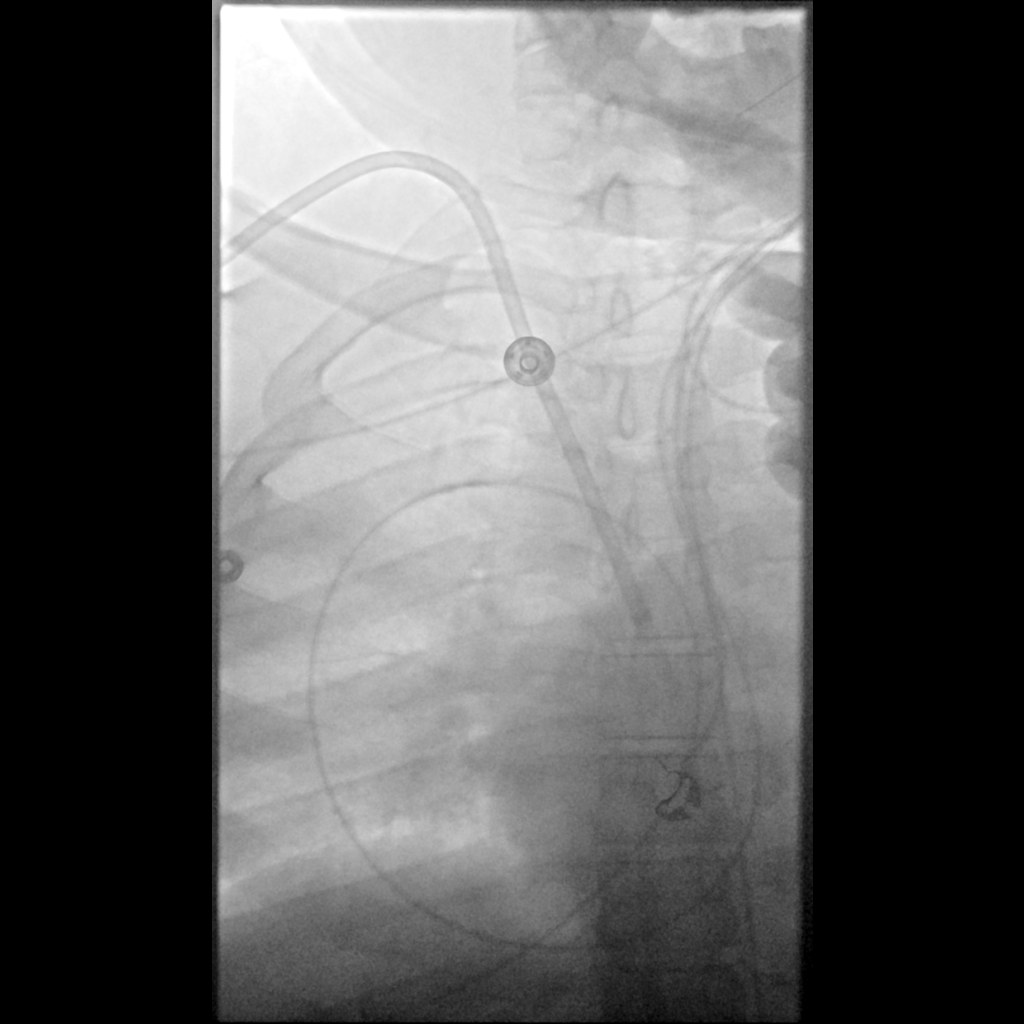
[im 2/3]
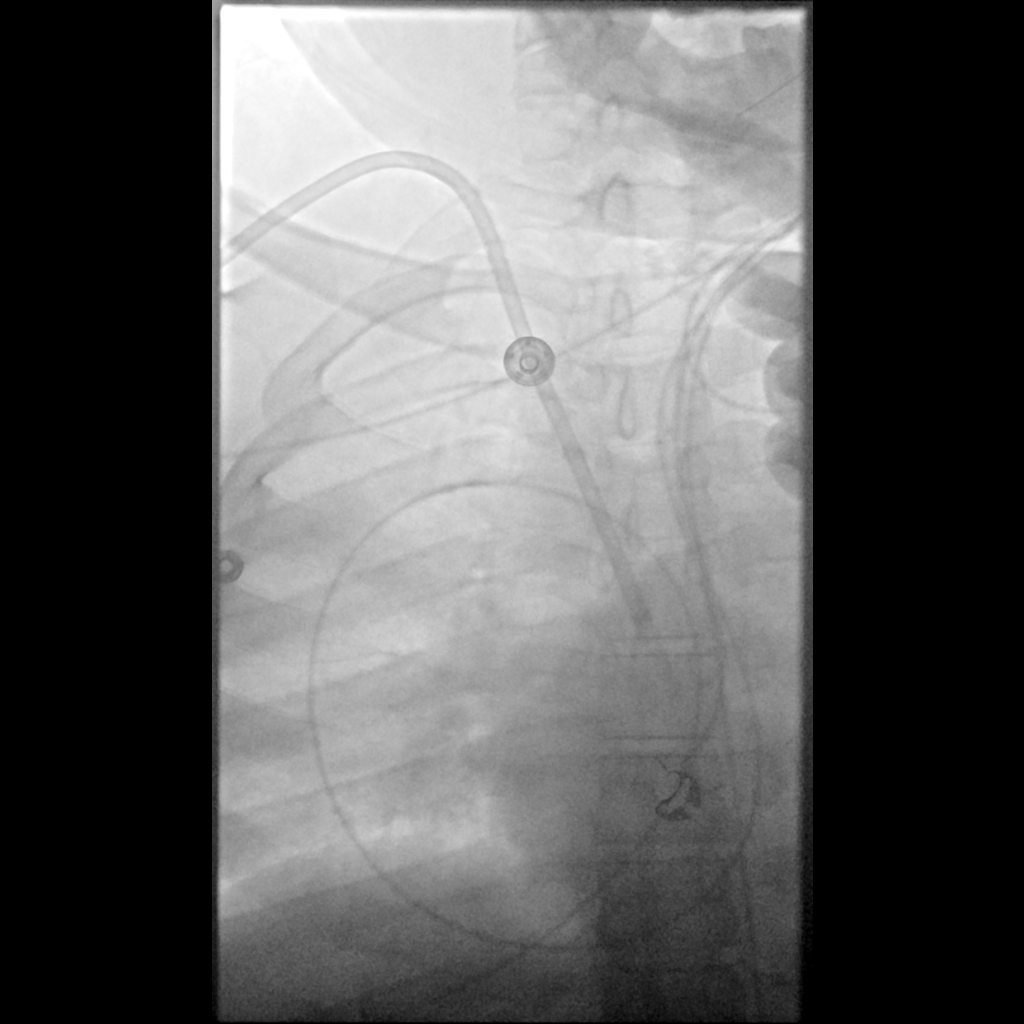
[im 3/3]
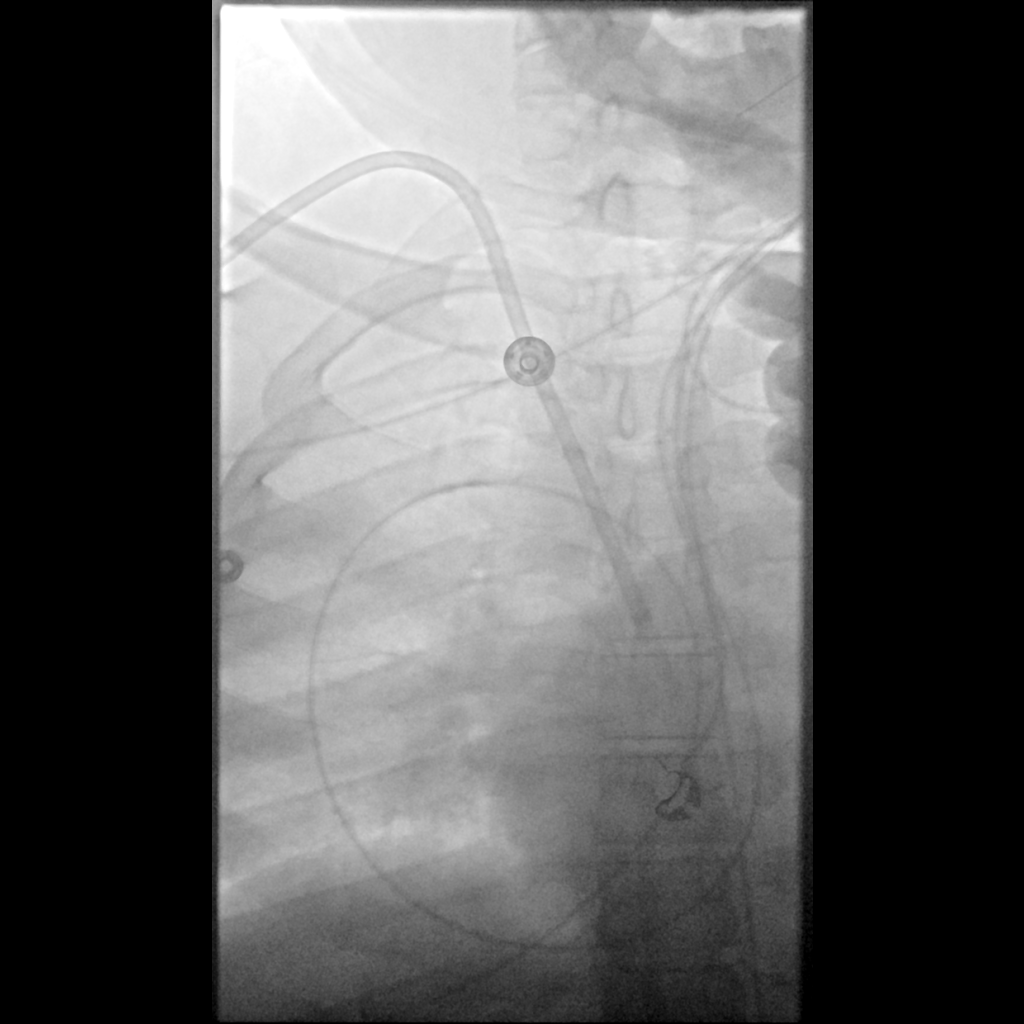

[4 of 4 positions shown; findings below may reference images not displayed]

EXAM:
FLUOROSCOPIC AND ULTRASOUND GUIDED PLACEMENT OF A TUNNELED DIALYSIS
CATHETER

MEDICATIONS:
Inpatient and receiving antibiotics.

ANESTHESIA/SEDATION:
Local anesthetic, 1% lidocaine

FLUOROSCOPY TIME:  Fluoroscopy Time: 3 minutes 30 seconds (133 mGy).

COMPLICATIONS:
None immediate.

PROCEDURE:
The procedure was explained to the patient. The risks and benefits
of the procedure were discussed and the patient's questions were
addressed. Informed consent was obtained from the patient. The
patient was placed supine on the interventional table. Ultrasound
confirmed a patent right internal jugular vein. Ultrasound image
obtained for documentation. The right neck and chest was prepped and
draped in a sterile fashion. Maximal barrier sterile technique was
utilized including caps, mask, sterile gowns, sterile gloves,
sterile drape, hand hygiene and skin antiseptic. The right neck was
anesthetized with 1% lidocaine. A small incision was made with #11
blade scalpel. A 21 gauge needle directed into the right internal
jugular vein with ultrasound guidance. A micropuncture dilator set
was placed. A 23 cm tip to cuff Palindrome catheter was selected.
The skin below the right clavicle was anesthetized and a small
incision was made with an #11 blade scalpel. A subcutaneous tunnel
was formed to the vein dermatotomy site. The catheter was brought
through the tunnel. The vein dermatotomy site was dilated to
accommodate a peel-away sheath. The catheter was placed through the
peel-away sheath and directed into the central venous structures.
The tip of the catheter was placed at superior cavoatrial junction
with fluoroscopy. Fluoroscopic images were obtained for
documentation. Both lumens were found to aspirate and flush well.
The proper amount of heparin was flushed in both lumens. The vein
dermatotomy site was closed using a single layer of absorbable
suture and Dermabond. The catheter was secured to the skin using
Prolene suture.
IMPRESSION: Successful placement of a right jugular tunneled dialysis catheter
using ultrasound and fluoroscopic guidance.

ADDENDUM:
Correction: Sodium citrate was placed within the catheter lumens NOT
heparin.

*** End of Addendum ***
EXAM:
FLUOROSCOPIC AND ULTRASOUND GUIDED PLACEMENT OF A TUNNELED DIALYSIS
CATHETER

MEDICATIONS:
Inpatient and receiving antibiotics.

ANESTHESIA/SEDATION:
Local anesthetic, 1% lidocaine

FLUOROSCOPY TIME:  Fluoroscopy Time: 3 minutes 30 seconds (133 mGy).

COMPLICATIONS:
None immediate.

PROCEDURE:
The procedure was explained to the patient. The risks and benefits
of the procedure were discussed and the patient's questions were
addressed. Informed consent was obtained from the patient. The
patient was placed supine on the interventional table. Ultrasound
confirmed a patent right internal jugular vein. Ultrasound image
obtained for documentation. The right neck and chest was prepped and
draped in a sterile fashion. Maximal barrier sterile technique was
utilized including caps, mask, sterile gowns, sterile gloves,
sterile drape, hand hygiene and skin antiseptic. The right neck was
anesthetized with 1% lidocaine. A small incision was made with #11
blade scalpel. A 21 gauge needle directed into the right internal
jugular vein with ultrasound guidance. A micropuncture dilator set
was placed. A 23 cm tip to cuff Palindrome catheter was selected.
The skin below the right clavicle was anesthetized and a small
incision was made with an #11 blade scalpel. A subcutaneous tunnel
was formed to the vein dermatotomy site. The catheter was brought
through the tunnel. The vein dermatotomy site was dilated to
accommodate a peel-away sheath. The catheter was placed through the
peel-away sheath and directed into the central venous structures.
The tip of the catheter was placed at superior cavoatrial junction
with fluoroscopy. Fluoroscopic images were obtained for
documentation. Both lumens were found to aspirate and flush well.
The proper amount of heparin was flushed in both lumens. The vein
dermatotomy site was closed using a single layer of absorbable
suture and Dermabond. The catheter was secured to the skin using
Prolene suture.
IMPRESSION: Successful placement of a right jugular tunneled dialysis catheter
using ultrasound and fluoroscopic guidance.

## 2021-04-22 IMAGING — CT CT KNEE*R* W/O CM
3 series · 14 of 33 positions shown, 17 images · non-contrast
Comparison: None.

CLINICAL DATA: Right knee osteomyelitis suspected. Right knee pain.

EXAM:
CT OF THE RIGHT KNEE WITHOUT CONTRAST
TECHNIQUE: Multidetector CT imaging of the RIGHT knee was performed according
to the standard protocol. Multiplanar CT image reconstructions were
also generated.

[Series 5: right knee 1.5 st · axial · 0.56mm/px · z∈[+500,+709]mm · 6 of 181 slices shown, 8 images]
[im 28/181  soft-tissue]
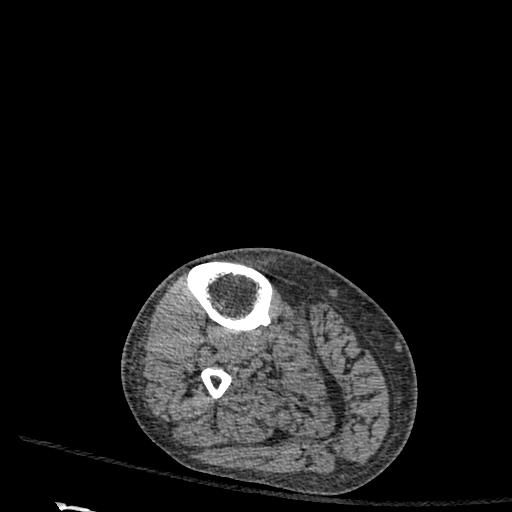
[im 28/181  bone]
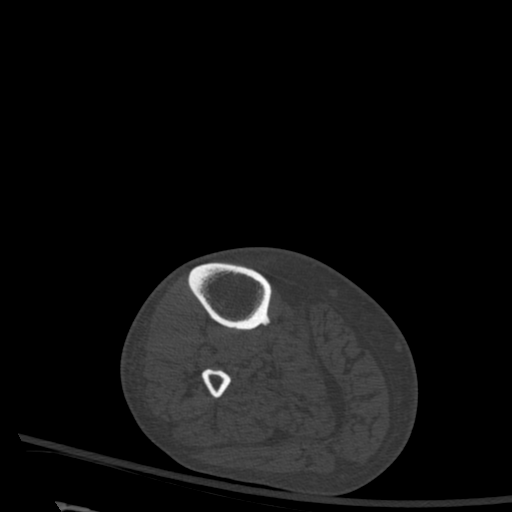
[im 56/181  bone]
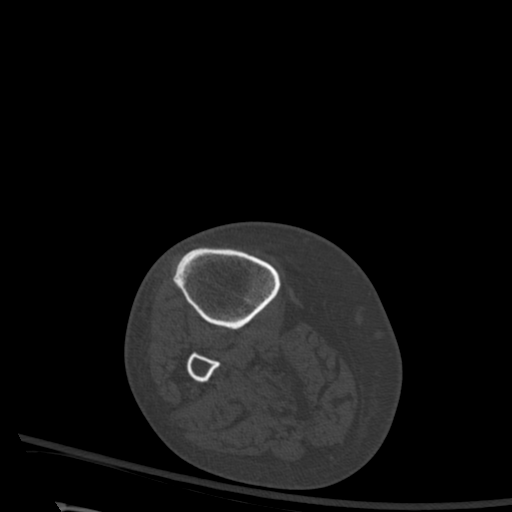
[im 84/181  bone]
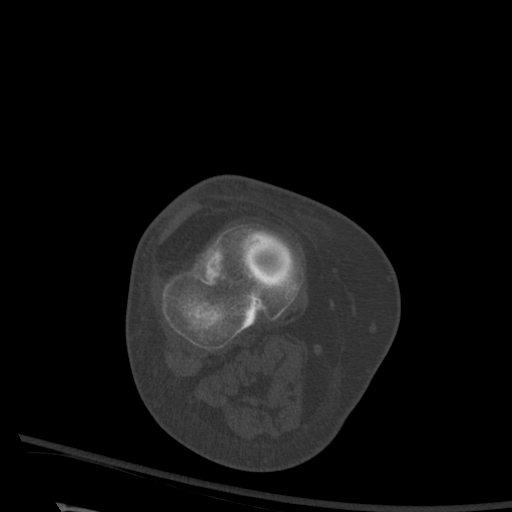
[im 111/181  bone]
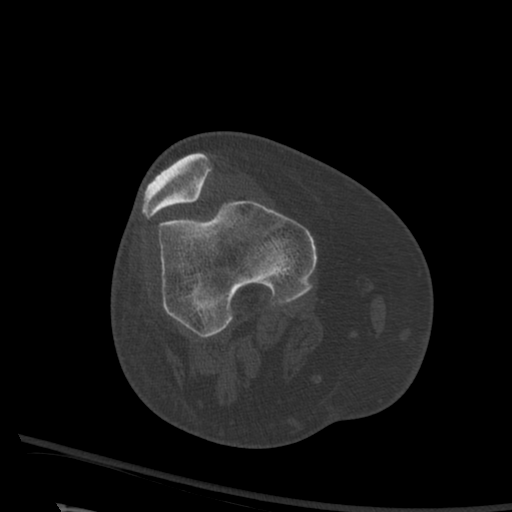
[im 139/181  soft-tissue]
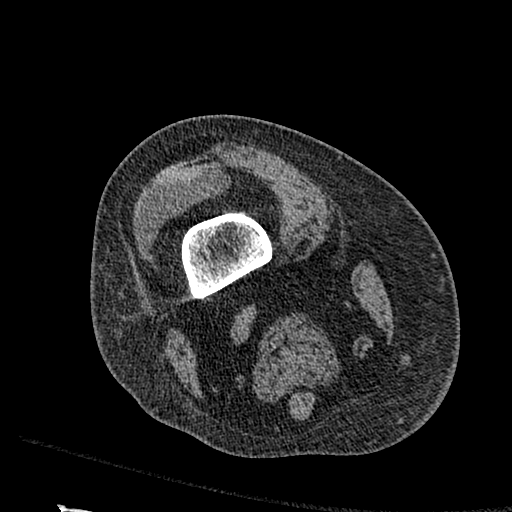
[im 139/181  bone]
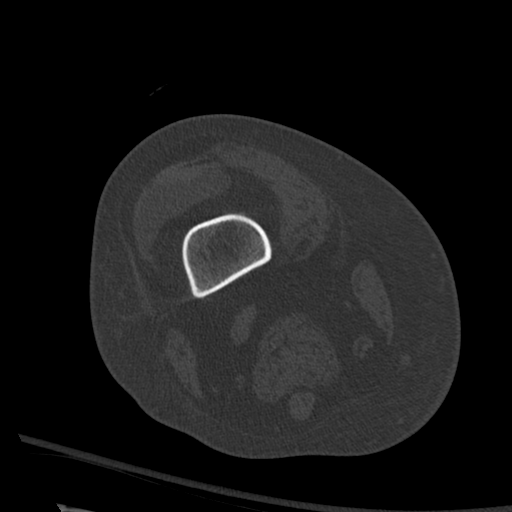
[im 167/181  bone]
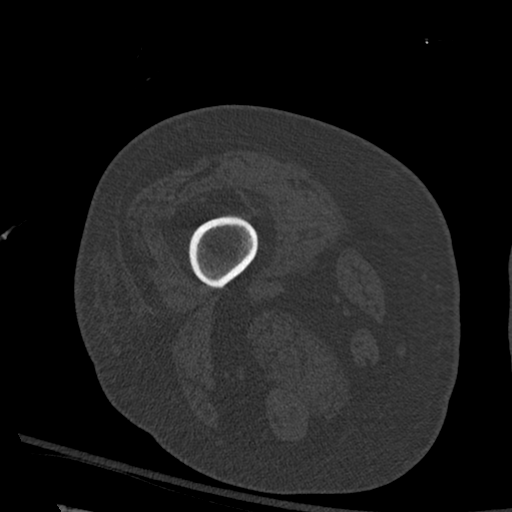

[Series 10: right knee cor st · coronal · 0.45mm/px · 3 of 167 slices shown]
[im 34/167  bone]
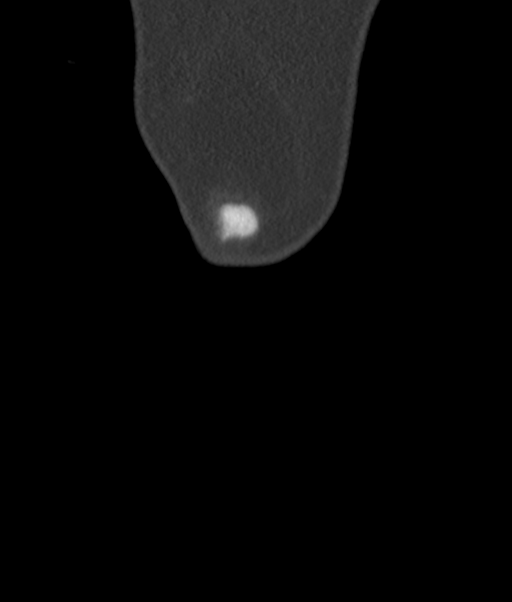
[im 67/167  bone]
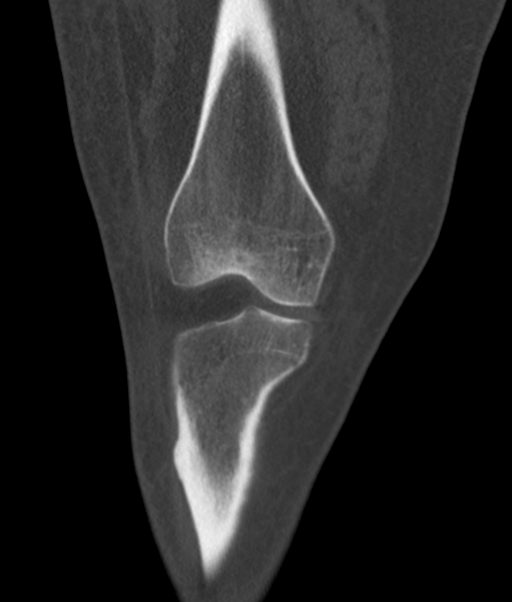
[im 100/167  bone]
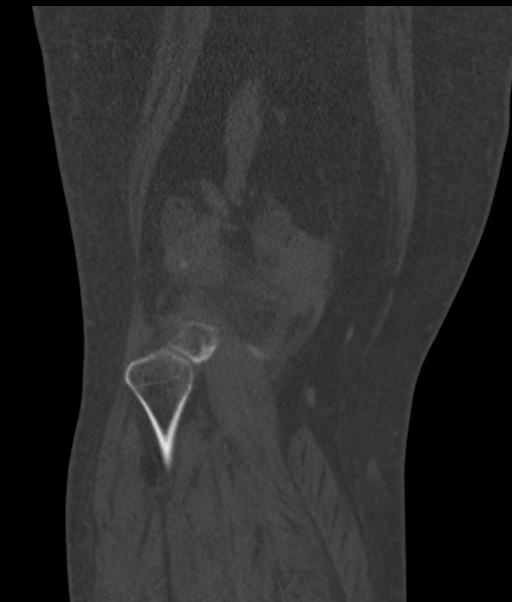

[Series 11: right knee sag st · sagittal · 0.41mm/px · 5 of 144 slices shown, 6 images]
[im 48/144  bone]
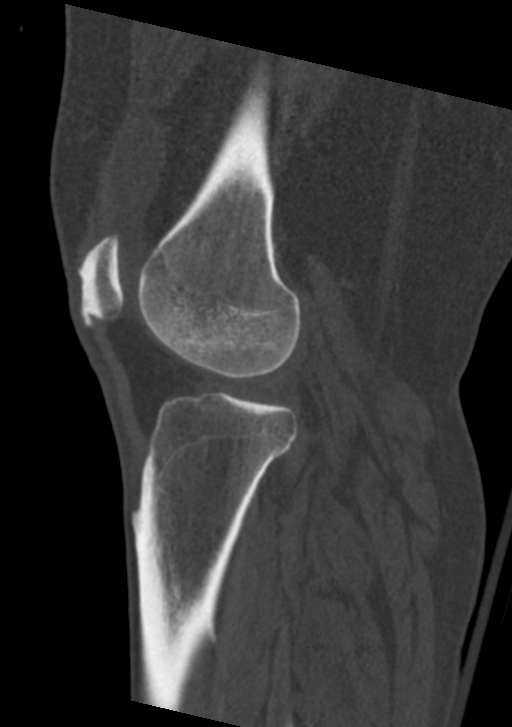
[im 60/144  bone]
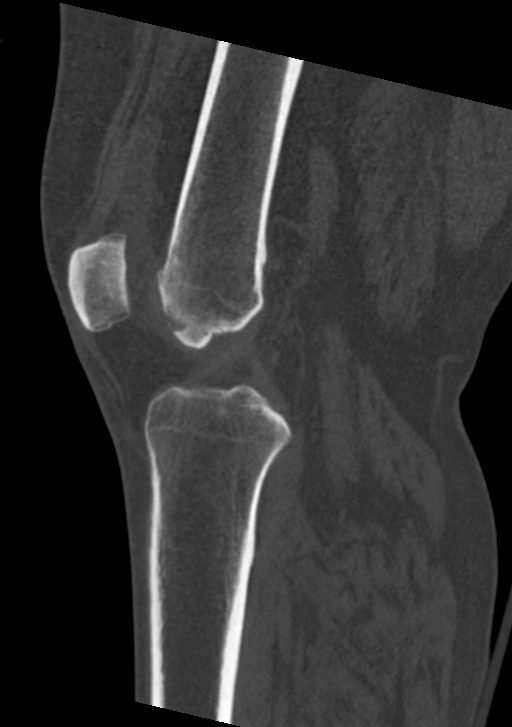
[im 72/144  soft-tissue]
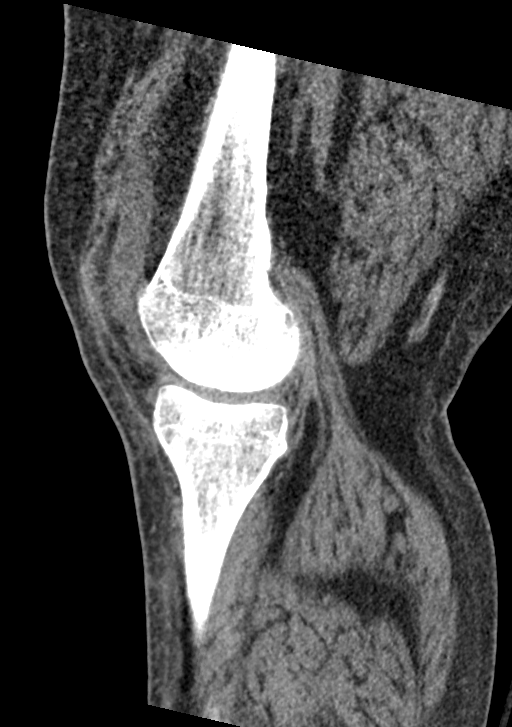
[im 72/144  bone]
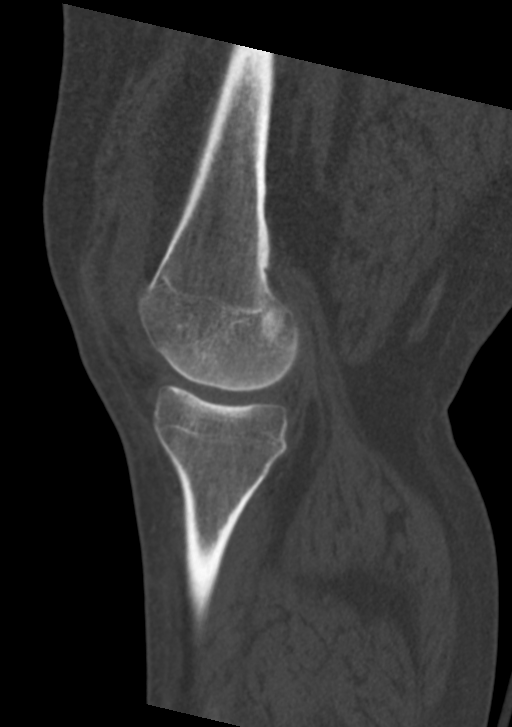
[im 84/144  bone]
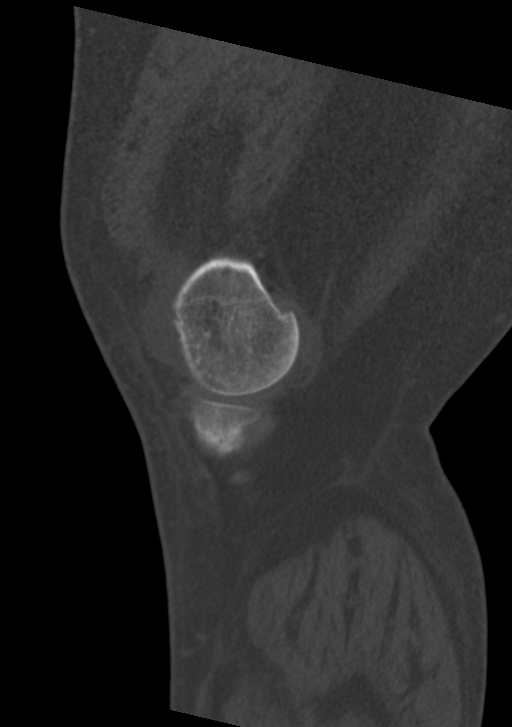
[im 96/144  bone]
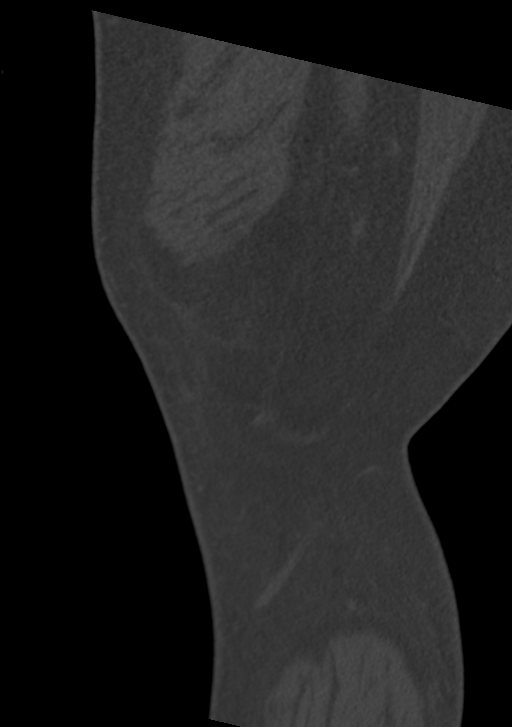

[14 of 33 positions shown; findings below may reference images not displayed]

FINDINGS: Bones/Joint/Cartilage

No fracture or dislocation. Normal alignment. Moderate joint
effusion.

Osteochondral lesion versus erosion involving the trochlear groove.

No other areas of bone destruction or erosive changes. No aggressive
osseous lesion.

Ligaments

Ligaments are suboptimally evaluated by CT.

Muscles and Tendons
Muscles are normal. Patellar tendon and quadriceps tendon are
intact.

Soft tissue
No fluid collection or hematoma.  No soft tissue mass.
IMPRESSION: 1. Osteochondral lesion versus erosion involving the trochlear
groove. Erosion may be secondary to an inflammatory or crystalline
arthropathy versus less likely infectious etiology. If there is
further clinical concern, recommend an MRI of the right knee without
and with intravenous contrast.
2. Moderate joint effusion.

## 2021-04-22 MED ORDER — SODIUM ZIRCONIUM CYCLOSILICATE 10 G PO PACK
10.0000 g | PACK | Freq: Once | ORAL | Status: AC
  Administered 2021-04-22: 10 g via ORAL
  Filled 2021-04-22: qty 1

## 2021-04-22 MED ORDER — HEPARIN SODIUM (PORCINE) 1000 UNIT/ML IJ SOLN
INTRAMUSCULAR | Status: AC
  Filled 2021-04-22: qty 1

## 2021-04-22 MED ORDER — CEFAZOLIN SODIUM-DEXTROSE 1-4 GM/50ML-% IV SOLN
1.0000 g | INTRAVENOUS | Status: DC
  Administered 2021-04-22: 1 g via INTRAVENOUS
  Filled 2021-04-22: qty 50

## 2021-04-22 MED ORDER — DEXTROSE 50 % IV SOLN
1.0000 | Freq: Once | INTRAVENOUS | Status: AC
  Administered 2021-04-22: 50 mL via INTRAVENOUS
  Filled 2021-04-22: qty 50

## 2021-04-22 MED ORDER — SODIUM BICARBONATE 8.4 % IV SOLN
50.0000 meq | Freq: Once | INTRAVENOUS | Status: AC
  Administered 2021-04-22: 50 meq via INTRAVENOUS
  Filled 2021-04-22: qty 50

## 2021-04-22 MED ORDER — LINACLOTIDE 145 MCG PO CAPS
145.0000 ug | ORAL_CAPSULE | Freq: Every day | ORAL | Status: DC
  Administered 2021-04-23 – 2021-05-18 (×14): 145 ug via ORAL
  Filled 2021-04-22 (×28): qty 1

## 2021-04-22 MED ORDER — CEFAZOLIN SODIUM-DEXTROSE 1-4 GM/50ML-% IV SOLN
1.0000 g | INTRAVENOUS | Status: DC
  Administered 2021-04-23 – 2021-05-13 (×21): 1 g via INTRAVENOUS
  Filled 2021-04-22 (×24): qty 50

## 2021-04-22 MED ORDER — HYDROMORPHONE HCL 1 MG/ML IJ SOLN
1.0000 mg | Freq: Once | INTRAMUSCULAR | Status: AC
  Administered 2021-04-22: 1 mg via INTRAVENOUS
  Filled 2021-04-22: qty 1

## 2021-04-22 MED ORDER — INSULIN ASPART 100 UNIT/ML IJ SOLN
5.0000 [IU] | Freq: Once | INTRAMUSCULAR | Status: AC
  Administered 2021-04-22: 5 [IU] via SUBCUTANEOUS

## 2021-04-22 MED ORDER — LIDOCAINE HCL 1 % IJ SOLN
INTRAMUSCULAR | Status: AC
  Filled 2021-04-22: qty 20

## 2021-04-22 MED ORDER — CALCIUM GLUCONATE-NACL 1-0.675 GM/50ML-% IV SOLN
1.0000 g | Freq: Once | INTRAVENOUS | Status: AC
  Administered 2021-04-22: 1000 mg via INTRAVENOUS
  Filled 2021-04-22: qty 50

## 2021-04-22 MED ORDER — ANTICOAGULANT SODIUM CITRATE 4% (200MG/5ML) IV SOLN
10.0000 mL | Status: AC
  Filled 2021-04-22: qty 10

## 2021-04-22 NOTE — Progress Notes (Signed)
Currently PROGRESS NOTE    Phillip Young  [id number] DOB: 05-28-1986 DOA: 04/12/2021 PCP: [name], MD    Brief Narrative:  35 year old gentleman with severe biventricular systolic heart failure with known ejection fraction less than 20%, type 2 diabetes, paroxysmal A-flutter on Eliquis and  recently started on hemodialysis and recent extensive hospitalization with cardiogenic shock and cardiorenal syndrome progressed to ESRD presents back to the emergency room with shortness of breath, left wrist and right knee hurting.  Unable to ambulate around.  Home oxygen concentrator not working. At the emergency room saturations improved and back on 4 L oxygen.  Chest x-ray with pulmonary edema mostly chronic.  COVID-19 and flu negative.  Unable to ambulate.  Continues to complain of pain.  He was found to have MSSA bacteremia.  ID and nephrology consulted. After catheter holiday, getting another permacath today with radiology.   Assessment & Plan:   Principal Problem:   MSSA bacteremia Active Problems:   Morbid obesity ([name])   OSA (obstructive sleep apnea)   Chronic systolic CHF (congestive heart failure) ([name])   Paroxysmal atrial flutter ([name])   Hyponatremia   ESRD (end stage renal disease) on dialysis ([place])   Acute on chronic respiratory failure with hypoxia ([name])   Medical non-compliance   Hypotension  MSSA bacteremia: Arthralgias. 4 out of 4 bottles positive for MSSA on admission Permacath removed, repeat blood cultures negative so far.  ID consulted.  Currently remains on cefazolin. Patient does have diffuse arthralgia, no evidence of palpable abscess, however difficult. TTE did not show any vegetation.  Too difficult to do TEE with current oxygen requirement.  He may end up on a ventilator. TEE is scheduled for 9/29. Difficult to do physical exam on his joints, however no obvious evidence of swelling or effusion. CT hip left normal. Patient declines further CT scan  as it is very painful to lay on the hard surface.  Acute on chronic respiratory failure with hypoxemia and hypercarbia: Due to malfunctioning concentrator at home.  Currently on 4 L oxygen and saturating adequate.  Keep on oxygen to keep saturation more than 90%.  Use BiPAP at night without interruption because of underlying obesity hypoventilation, sleep apnea and heart failure.  Stabilized. Hopefully this can be managed with Lasix today.  ESRD and hemodialysis: IR for permacath today.  Then dialysis.  Biventricular heart failure/paroxysmal atrial fibrillation/hypotension: Stable.  Known EF of less than 20%.  Followed by heart failure team.  Intolerance to advanced heart failure therapies. Midodrine 20 mg 3 times a day.  Fluid management with dialysis.  Not a candidate for ICD as per heart failure team. Currently in sinus rhythm on amiodarone.  Therapeutic on Eliquis.  Morbid obesity: BMI more than 50.  Started on semaglutide.  Ambulatory dysfunction: Work with PT OT.  Not medically stable.    DVT prophylaxis:  apixaban (ELIQUIS) tablet 5 mg   Code Status: Full code Family Communication: None Disposition Plan: Status is: Inpatient.   Dispo: The patient is from: Home              Anticipated d/c is to: SNF versus home with home health.              Patient currently is not medically stable.   Difficult to place patient No         Consultants:  Cardiology Nephrology Infectious disease Palliative care  Procedures:  None  Antimicrobials:  Cefazolin   Subjective: Patient seen and examined in the morning rounds.  He was trying to work with therapist.  Complains of ongoing pain left hip.  They were able to walk him around the bed. Agreeable to go for permacath placement today.  Currently remains on 10 L oxygen. Apologizes for using curse word, however he feels frustrated.  Wants [place] to help him relieve pain.  We discussed about getting a strong IV pain medication along  with transfer and is agreeable. Overnight events noted, given multiple medications for temporizing potassium 6.4.  Objective: Vitals:   04/21/21 2032 04/22/21 0030 04/22/21 0300 04/22/21 0715  BP: 118/75 (!) 144/87 116/63 114/63  Pulse: 79 77 75 81  Resp: 19 (!) 25 (!) 23 (!) 22  Temp: 98.1 F (36.7 C)  97.9 F (36.6 C) 97.8 F (36.6 C)  TempSrc: Oral  Axillary Oral  SpO2: 96% 99% 97% 92%  Weight:      Height:        Intake/Output Summary (Last 24 hours) at 04/22/2021 0945 Last data filed at 04/22/2021 0900 Gross per 24 hour  Intake 1780 ml  Output 1000 ml  Net 780 ml   Filed Weights   04/13/21 0043 04/16/21 1431 04/16/21 1913  Weight: (!) 188 kg (!) 194.6 kg (!) 189.8 kg    Examination:  General: Sick looking.  Anxious.  In moderate distress on mobility. Morbidly obese.  Cardiovascular: Regular rate rhythm.  Difficult to auscultate. Respiratory: No added sounds.  Difficult to auscultate. Gastrointestinal: Soft and nontender.  Obese and pendulous.  Bowel sounds present. Ext: Chronic edematous legs.  Nonpitting edema. Left wrist joint with some stiffness, no joint effusion or swelling. Right knee with tenderness to palpation but no joint effusion or swelling.   Data Reviewed: I have personally reviewed following labs and imaging studies  CBC: Recent Labs  Lab 04/16/21 1526 04/18/21 0030 04/19/21 0048 04/20/21 0051 04/22/21 0641  WBC 15.3* 19.9* 16.2* 14.4* 14.7*  NEUTROABS  --  17.1* 13.0* 11.8*  --   HGB 8.4* 8.9* 9.0* 10.3* 9.0*  HCT 27.7* 28.2* 28.9* 32.2* 28.8*  MCV 101.8* 100.7* 99.3 98.2 96.6  PLT 319 351 343 341 [id number]   Basic Metabolic Panel: Recent Labs  Lab 04/16/21 1526 04/18/21 0030 04/19/21 0048 04/20/21 0051 04/22/21 0043  NA 127* 128* 130* 129* 128*  K 4.6 6.2* 4.7 4.7 6.6*  CL 87* 90* 90* 91* 91*  CO2 '25 25 24 22 24  '$ GLUCOSE 143* 126* 112* 107* 165*  BUN 65* 61* 52* 72* 93*  CREATININE 7.87* 5.72* 5.17* 5.58* 5.06*  CALCIUM 8.7* 9.9  9.5 9.8 10.0  PHOS 8.8* 8.4* 6.8* 8.1* 9.3*   GFR: Estimated Creatinine Clearance: 34.6 mL/min (A) (by C-G formula based on SCr of 5.06 mg/dL (H)). Liver Function Tests: Recent Labs  Lab 04/16/21 1526 04/18/21 0030 04/19/21 0048 04/20/21 0051 04/22/21 0043  ALBUMIN 2.9* 2.8* 2.6* 2.6* 2.6*   No results for input(s): LIPASE, AMYLASE in the last 168 hours.  No results for input(s): AMMONIA in the last 168 hours. Coagulation Profile: No results for input(s): INR, PROTIME in the last 168 hours. Cardiac Enzymes: No results for input(s): CKTOTAL, CKMB, CKMBINDEX, TROPONINI in the last 168 hours. BNP (last 3 results) No results for input(s): PROBNP in the last 8760 hours. HbA1C: No results for input(s): HGBA1C in the last 72 hours. CBG: No results for input(s): GLUCAP in the last 168 hours. Lipid Profile: No results for input(s): CHOL, HDL, LDLCALC, TRIG, CHOLHDL, LDLDIRECT in the last 72 hours. Thyroid Function Tests: No results  for input(s): TSH, T4TOTAL, FREET4, T3FREE, THYROIDAB in the last 72 hours. Anemia Panel: No results for input(s): VITAMINB12, FOLATE, FERRITIN, TIBC, IRON, RETICCTPCT in the last 72 hours. Sepsis Labs: No results for input(s): PROCALCITON, LATICACIDVEN in the last 168 hours.  Recent Results (from the past 240 hour(s))  Resp Panel by RT-PCR (Flu A&B, Covid) Nasopharyngeal Swab     Status: None   Collection Time: 04/12/21  8:51 PM   Specimen: Nasopharyngeal Swab; Nasopharyngeal(NP) swabs in vial transport medium  Result Value Ref Range Status   SARS Coronavirus 2 by RT PCR NEGATIVE NEGATIVE Final    Comment: (NOTE) SARS-CoV-2 target nucleic acids are NOT DETECTED.  The SARS-CoV-2 RNA is generally detectable in upper respiratory specimens during the acute phase of infection. The lowest concentration of SARS-CoV-2 viral copies this assay can detect is 138 copies/mL. A negative result does not preclude SARS-Cov-2 infection and should not be used as the  sole basis for treatment or other patient management decisions. A negative result may occur with  improper specimen collection/handling, submission of specimen other than nasopharyngeal swab, presence of viral mutation(s) within the areas targeted by this assay, and inadequate number of viral copies(<138 copies/mL). A negative result must be combined with clinical observations, patient history, and epidemiological information. The expected result is Negative.  Fact Sheet for Patients:  EntrepreneurPulse.com.au  Fact Sheet for Healthcare Providers:  IncredibleEmployment.be  This test is no t yet approved or cleared by the [place] FDA and  has been authorized for detection and/or diagnosis of SARS-CoV-2 by FDA under an Emergency Use Authorization (EUA). This EUA will remain  in effect (meaning this test can be used) for the duration of the COVID-19 declaration under Section 564(b)(1) of the Act, 21 U.S.C.section 360bbb-3(b)(1), unless the authorization is terminated  or revoked sooner.       Influenza A by PCR NEGATIVE NEGATIVE Final   Influenza B by PCR NEGATIVE NEGATIVE Final    Comment: (NOTE) The Xpert Xpress SARS-CoV-2/FLU/RSV plus assay is intended as an aid in the diagnosis of influenza from Nasopharyngeal swab specimens and should not be used as a sole basis for treatment. Nasal washings and aspirates are unacceptable for Xpert Xpress SARS-CoV-2/FLU/RSV testing.  Fact Sheet for Patients: EntrepreneurPulse.com.au  Fact Sheet for Healthcare Providers: IncredibleEmployment.be  This test is not yet approved or cleared by the [place] FDA and has been authorized for detection and/or diagnosis of SARS-CoV-2 by FDA under an Emergency Use Authorization (EUA). This EUA will remain in effect (meaning this test can be used) for the duration of the COVID-19 declaration under Section 564(b)(1) of the  Act, 21 U.S.C. section 360bbb-3(b)(1), unless the authorization is terminated or revoked.  Performed at [facility], [place] [address], [place], [place] [address]   MRSA Next Gen by PCR, Nasal     Status: None   Collection Time: 04/13/21  1:02 AM   Specimen: Nasal Mucosa; Nasal Swab  Result Value Ref Range Status   MRSA by PCR Next Gen NOT DETECTED NOT DETECTED Final    Comment: (NOTE) The GeneXpert MRSA Assay (FDA approved for NASAL specimens only), is one component of a comprehensive MRSA colonization surveillance program. It is not intended to diagnose MRSA infection nor to guide or monitor treatment for MRSA infections. Test performance is not FDA approved in patients less than 80 years old. Performed at [facility], [place] [address] [place], [place] [address]   Culture, blood (Routine X 2) w Reflex  to ID Panel     Status: Abnormal   Collection Time: 04/16/21  9:42 PM   Specimen: BLOOD  Result Value Ref Range Status   Specimen Description BLOOD BLOOD LEFT ARM  Final   Special Requests   Final    BOTTLES DRAWN AEROBIC AND ANAEROBIC Blood Culture adequate volume   Culture  Setup Time   Final    GRAM POSITIVE COCCI IN BOTH AEROBIC AND ANAEROBIC BOTTLES CRITICAL RESULT CALLED TO, READ BACK BY AND VERIFIED WITH: PHARMD A.LOWLES AT [id number] ON 04/17/2021 BY T.SAAD. Performed at [facility], [place] [address], [place], [place] [address]    Culture STAPHYLOCOCCUS AUREUS (A)  Final   Report Status 04/19/2021 FINAL  Final   Organism ID, Bacteria STAPHYLOCOCCUS AUREUS  Final      Susceptibility   Staphylococcus aureus - MIC*    CIPROFLOXACIN <=0.5 SENSITIVE Sensitive     ERYTHROMYCIN <=0.25 SENSITIVE Sensitive     GENTAMICIN <=0.5 SENSITIVE Sensitive     OXACILLIN <=0.25 SENSITIVE Sensitive     TETRACYCLINE <=1 SENSITIVE Sensitive     VANCOMYCIN <=0.5 SENSITIVE Sensitive     TRIMETH/SULFA <=10 SENSITIVE Sensitive     CLINDAMYCIN <=0.25 SENSITIVE Sensitive      RIFAMPIN <=0.5 SENSITIVE Sensitive     Inducible Clindamycin NEGATIVE Sensitive     * STAPHYLOCOCCUS AUREUS  Culture, blood (Routine X 2) w Reflex to ID Panel     Status: Abnormal   Collection Time: 04/16/21  9:42 PM   Specimen: BLOOD  Result Value Ref Range Status   Specimen Description BLOOD BLOOD RIGHT ARM  Final   Special Requests   Final    BOTTLES DRAWN AEROBIC AND ANAEROBIC Blood Culture results may not be optimal due to an inadequate volume of blood received in culture bottles   Culture  Setup Time   Final    GRAM POSITIVE COCCI IN BOTH AEROBIC AND ANAEROBIC BOTTLES CRITICAL VALUE NOTED.  VALUE IS CONSISTENT WITH PREVIOUSLY REPORTED AND CALLED VALUE.    Culture (A)  Final    STAPHYLOCOCCUS AUREUS SUSCEPTIBILITIES PERFORMED ON PREVIOUS CULTURE WITHIN THE LAST 5 DAYS. Performed at [facility], [place] [address], [place], [place] [address]    Report Status 04/19/2021 FINAL  Final  Blood Culture ID Panel (Reflexed)     Status: Abnormal   Collection Time: 04/16/21  9:42 PM  Result Value Ref Range Status   Enterococcus faecalis NOT DETECTED NOT DETECTED Final   Enterococcus Faecium NOT DETECTED NOT DETECTED Final   Listeria monocytogenes NOT DETECTED NOT DETECTED Final   Staphylococcus species DETECTED (A) NOT DETECTED Final    Comment: CRITICAL RESULT CALLED TO, READ BACK BY AND VERIFIED WITH: PHARMD A.LOWLES AT 1121 ON 04/17/2021 BY T.SAAD.    Staphylococcus aureus (BCID) DETECTED (A) NOT DETECTED Final    Comment: CRITICAL RESULT CALLED TO, READ BACK BY AND VERIFIED WITH: PHARMD A.LOWLES AT 1121 ON 04/17/2021 BY T.SAAD.    Staphylococcus epidermidis NOT DETECTED NOT DETECTED Final   Staphylococcus lugdunensis NOT DETECTED NOT DETECTED Final   Streptococcus species NOT DETECTED NOT DETECTED Final   Streptococcus agalactiae NOT DETECTED NOT DETECTED Final   Streptococcus pneumoniae NOT DETECTED NOT DETECTED Final   Streptococcus pyogenes NOT DETECTED NOT DETECTED Final    A.calcoaceticus-baumannii NOT DETECTED NOT DETECTED Final   Bacteroides fragilis NOT DETECTED NOT DETECTED Final   Enterobacterales NOT DETECTED NOT DETECTED Final   Enterobacter cloacae complex NOT DETECTED NOT DETECTED Final   Escherichia  coli NOT DETECTED NOT DETECTED Final   Klebsiella aerogenes NOT DETECTED NOT DETECTED Final   Klebsiella oxytoca NOT DETECTED NOT DETECTED Final   Klebsiella pneumoniae NOT DETECTED NOT DETECTED Final   Proteus species NOT DETECTED NOT DETECTED Final   Salmonella species NOT DETECTED NOT DETECTED Final   Serratia marcescens NOT DETECTED NOT DETECTED Final   Haemophilus influenzae NOT DETECTED NOT DETECTED Final   Neisseria meningitidis NOT DETECTED NOT DETECTED Final   Pseudomonas aeruginosa NOT DETECTED NOT DETECTED Final   Stenotrophomonas maltophilia NOT DETECTED NOT DETECTED Final   Candida albicans NOT DETECTED NOT DETECTED Final   Candida auris NOT DETECTED NOT DETECTED Final   Candida glabrata NOT DETECTED NOT DETECTED Final   Candida krusei NOT DETECTED NOT DETECTED Final   Candida parapsilosis NOT DETECTED NOT DETECTED Final   Candida tropicalis NOT DETECTED NOT DETECTED Final   Cryptococcus neoformans/gattii NOT DETECTED NOT DETECTED Final   Meth resistant mecA/C and MREJ NOT DETECTED NOT DETECTED Final    Comment: Performed at [facility], [place] [address], [place], [place] [address]  Culture, blood (Routine X 2) w Reflex to ID Panel     Status: None (Preliminary result)   Collection Time: 04/19/21 11:32 AM   Specimen: BLOOD RIGHT HAND  Result Value Ref Range Status   Specimen Description BLOOD RIGHT HAND  Final   Special Requests   Final    BOTTLES DRAWN AEROBIC AND ANAEROBIC Blood Culture adequate volume   Culture   Final    NO GROWTH 2 DAYS Performed at [facility], [place] [address] [place], [place] [address]    Report Status PENDING  Incomplete  Culture, blood (Routine X 2) w Reflex to ID Panel     Status: None  (Preliminary result)   Collection Time: 04/19/21 11:37 AM   Specimen: BLOOD LEFT HAND  Result Value Ref Range Status   Specimen Description BLOOD LEFT HAND  Final   Special Requests   Final    BOTTLES DRAWN AEROBIC AND ANAEROBIC Blood Culture adequate volume   Culture   Final    NO GROWTH 2 DAYS Performed at [facility], [place] [address], [place], [place] [address]    Report Status PENDING  Incomplete         Radiology Studies: DG CHEST PORT 1 VIEW  Result Date: 04/21/2021 CLINICAL DATA:  Shortness of breath and respiratory failure. EXAM: PORTABLE CHEST 1 VIEW COMPARISON:  04/12/2021 FINDINGS: Cardiac enlargement unchanged from previous exam. Low lung volumes. Pulmonary vascular congestion is again identified. New asymmetric opacity is identified within the right upper lobe which may reflect pneumonia. Atelectasis noted within the lung bases. IMPRESSION: 1. New asymmetric opacity within the right upper lobe may reflect pneumonia. 2. Stable cardiac enlargement and pulmonary vascular congestion. Electronically Signed   By: Kerby Moors M.D.   On: 04/21/2021 09:26        Scheduled Meds:  allopurinol  100 mg Oral Daily   amiodarone  200 mg Oral Daily   apixaban  5 mg Oral BID   Chlorhexidine Gluconate Cloth  6 each Topical Daily   Chlorhexidine Gluconate Cloth  6 each Topical Q0600    HYDROmorphone (DILAUDID) injection  1 mg Intravenous Once   lanthanum  500 mg Oral TID WC   midodrine  20 mg Oral TID WC   predniSONE  40 mg Oral Q breakfast   senna  2 tablet Oral BID   Continuous Infusions:  anticoagulant  sodium citrate     anticoagulant sodium citrate      ceFAZolin (ANCEF) IV       LOS: 6 days    Time spent: 30 minutes    Barb Merino, MD [facility] Pager [phone]

## 2021-04-22 NOTE — Progress Notes (Signed)
HOSPITAL MEDICINE OVERNIGHT EVENT NOTE    Nursing is notified me that potassium is 6.6 this morning.  Chart reviewed, patient has a known history of end-stage renal disease, is currently being treated for MSSA bacteremia and acute on chronic hypoxic respiratory failure.  Etiology of hyperkalemia is likely ongoing decreased renal excretion of potassium due to cardiorenal syndrome.  Ordering stat EKG.  Administering 10 g of Lokelma, 1 amp of sodium bicarbonate, 1 g of calcium gluconate, 5 units of insulin and 1 amp of D50.  Continue to monitor patient closely on telemetry.  Vernelle Emerald  MD [facility]

## 2021-04-22 NOTE — Procedures (Signed)
Interventional Radiology Procedure:   Indications: ESRD and needs new dialysis access  Procedure: Tunneled dialysis catheter placement  Findings: Right jugular Palindrome (23 cm tip to cuff) with tip at SVC/RA junction  Complications: No immediate complications noted.     EBL: Minimal  Plan: Dialysis catheter is ready to use.    Phillip Holycross R. Anselm Pancoast, MD  Pager: [phone]

## 2021-04-22 NOTE — Progress Notes (Signed)
Occupational Therapy Treatment Patient Details Name: Phillip Young MRN: [id number] DOB: 07-30-1985 Today's Date: 04/22/2021   History of present illness 35 y.o. male admitted 9/18 with SOB, sats at 78% and reporting home O2 concentrator not working properly. Pt with recent hospitalization 7/20-9/1 with cardiogenic shock, cardiorenal syndrome which unfortunately progressed to ESRD with need for HD, biventricular heart failure. PMH: CHF, DM 2, a flutter, ESRD on HD TTS.   OT comments  Patient received in bed and nursing states that patient had pain medication prior to session. Patient appeared agitated during visit and was tearful when standing. Patient required assistance of 2 for bed mobility and standing.  Patient had complaints of right knee and left hip pain affecting his progress.  Acute OT to continue to follow.     Recommendations for follow up therapy are one component of a multi-disciplinary discharge planning process, led by the attending physician.  Recommendations may be updated based on patient status, additional functional criteria and insurance authorization.    Follow Up Recommendations  SNF    Equipment Recommendations       Recommendations for Other Services      Precautions / Restrictions Precautions Precautions: Other (comment) (left hip and right knee pain) Precaution Comments: monitor O2 (wears 1 L O2 at home)       Mobility Bed Mobility Overal bed mobility: Needs Assistance Bed Mobility: Supine to Sit;Sit to Supine     Supine to sit: Max assist;+2 for physical assistance;HOB elevated Sit to supine: Max assist;+2 for physical assistance   General bed mobility comments: increased time to perform bed mobility due to pain.  Patient was able to assist with pulling self up in bed with HOB down    Transfers       Sit to Stand: Max assist;+2 physical assistance;From elevated surface         General transfer comment: Patient begain cryng while standing  stating that he was becoming bedbound    Balance Overall balance assessment: Needs assistance Sitting-balance support: Feet supported;No upper extremity supported Sitting balance-Leahy Scale: Fair Sitting balance - Comments: required on extremity assist for balance   Standing balance support: Bilateral upper extremity supported Standing balance-Leahy Scale: Poor Standing balance comment: max assist +2 to stand and tolerated 60+ seconds of standing                           ADL either performed or assessed with clinical judgement   ADL Overall ADL's : Needs assistance/impaired                                       General ADL Comments: Patient would not use a urinal and but was willing to perform perineal cleaning     Vision       Perception     Praxis      Cognition Arousal/Alertness: Awake/alert Behavior During Therapy: Agitated Overall Cognitive Status: Within Functional Limits for tasks assessed                                 General Comments: Patient crying during session stating that he feels like he is bedbound        Exercises     Shoulder Instructions       General Comments  Pertinent Vitals/ Pain       Pain Assessment: Faces Faces Pain Scale: Hurts whole lot Pain Location: left hip, right knee Pain Descriptors / Indicators: Grimacing;Moaning;Guarding;Constant;Discomfort;Sore Pain Intervention(s): Premedicated before session  Home Living                                          Prior Functioning/Environment              Frequency  Min 2X/week        Progress Toward Goals  OT Goals(current goals can now be found in the care plan section)  Progress towards OT goals: Not progressing toward goals - comment  Acute Rehab OT Goals Patient Stated Goal: to reduce pain and return to independent mobility OT Goal Formulation: With patient Time For Goal Achievement:  04/27/21 Potential to Achieve Goals: Fair ADL Goals Pt Will Perform Grooming: with set-up;standing Pt Will Perform Lower Body Bathing: with min guard assist;sit to/from stand Pt Will Transfer to Toilet: with min guard assist;stand pivot transfer;bedside commode Pt/caregiver will Perform Home Exercise Program: Increased ROM;Left upper extremity;Independently;With written HEP provided  Plan Discharge plan needs to be updated    Co-evaluation    PT/OT/SLP Co-Evaluation/Treatment: Yes Reason for Co-Treatment: Complexity of the patient's impairments (multi-system involvement);For patient/therapist safety   OT goals addressed during session: ADL's and self-care      AM-PAC OT "6 Clicks" Daily Activity     Outcome Measure   Help from another person eating meals?: A Little Help from another person taking care of personal grooming?: A Little Help from another person toileting, which includes using toliet, bedpan, or urinal?: A Lot Help from another person bathing (including washing, rinsing, drying)?: A Lot Help from another person to put on and taking off regular upper body clothing?: A Little Help from another person to put on and taking off regular lower body clothing?: A Lot 6 Click Score: 15    End of Session    OT Visit Diagnosis: Unsteadiness on feet (R26.81);Other abnormalities of gait and mobility (R26.89);Muscle weakness (generalized) (M62.81);Pain Pain - Right/Left: Left Pain - part of body: Hip   Activity Tolerance Patient limited by pain   Patient Left in bed;with call bell/phone within reach   Nurse Communication Other (comment) (communicated patient's performance with therapy and requesting stronger pain meds)        Time: [id number] OT Time Calculation (min): 33 min  Charges: OT General Charges $OT Visit: 1 Visit OT Treatments $Therapeutic Activity: 8-22 mins  Lodema Hong, OTA   Rache Klimaszewski Alexis Goodell 04/22/2021, 9:29 AM

## 2021-04-22 NOTE — Progress Notes (Signed)
    [facility] has been requested to perform a transesophageal echocardiogram on Phillip Young for bacteremia in pt with sleep apnea, HTN, Biventricular HFand obesity.  After careful review of history and examination, the risks and benefits of transesophageal echocardiogram have been explained including risks of esophageal damage, perforation (1:10,000 risk), bleeding, pharyngeal hematoma as well as other potential complications associated with conscious sedation including aspiration, arrhythmia, respiratory failure and death. Alternatives to treatment were discussed, questions were answered. Patient is willing to proceed.   Cecilie Kicks, NP  04/22/2021 9:18 PM

## 2021-04-22 NOTE — Progress Notes (Signed)
Pt still currently eating. RN will contact RT when pt is ready for bedtime. RT will cont to monitor.

## 2021-04-22 NOTE — Progress Notes (Signed)
Physical Therapy Treatment Patient Details Name: Phillip Young MRN: [id number] DOB: 07-03-1986 Today's Date: 04/22/2021   History of Present Illness Pt is a 35 y.o. male admitted 04/12/21 with SOB, hypoxia. Workup for MSSA bacteremia, acute hypoxic respiratory failure, cardiorenal syndrome. Pt had catheter holiday with clearance of bacteremia; pt declined reinsertion of HD catheter 9/27. Course complicated by R knee and L hip pain; pelvic CT negative for acute abnormality. Of note, recent admission 02/11/21-03/26/21 with cardiogenic shock, biventricular HF, progression to ESRD with need for HD. Other PMH includes CHF, DM2, aflutter, ESRD (HD TTS).   PT Comments    Pt limited by multiple complaints this session, especially c/o L hip and R knee pain. Pt requires maxA+2 for bed mobility, maxA+2 to stand, unable to take steps. Pt becoming emotional during session, states, "I can't believe I'm bed bound man." Pt limited by pain, generalized weakness, decreased activity tolerance and impaired balance. Based on current functional mobility deficits, recommend SNF-level therapies to maximize functional mobility and independence prior to return home.    Recommendations for follow up therapy are one component of a multi-disciplinary discharge planning process, led by the attending physician.  Recommendations may be updated based on patient status, additional functional criteria and insurance authorization.  Follow Up Recommendations  SNF;Supervision for mobility/OOB     Equipment Recommendations  Lift equipment; bariatric wheelchair; bariatric walker; bariatric BSC   Recommendations for Other Services       Precautions / Restrictions Precautions Precautions: Fall;Other (comment) Precaution Comments: Watch SpO2 (wears 1L O2 baseline) Restrictions Weight Bearing Restrictions: No     Mobility  Bed Mobility Overal bed mobility: Needs Assistance Bed Mobility: Supine to Sit;Sit to Supine     Supine  to sit: Max assist;+2 for physical assistance;HOB elevated Sit to supine: Max assist;+2 for physical assistance;HOB elevated   General bed mobility comments: Increased time and effort secondary to pain; pt particular about how mobility completed, maxA+2 for trunk elevation and BLE management    Transfers Overall transfer level: Needs assistance Equipment used: 2 person hand held assist Transfers: Sit to/from Stand Sit to Stand: Max assist;+2 physical assistance;From elevated surface         General transfer comment: Pt able to stand on third attempt with bed elevated higher each trial, heavy reliance on maxA+2 for trunk elevation and stability; R knee buckling requiring block; pt becoming emotional and crying while standing "I'm becoming bedbound"; attempted side steps at EOB but pt unable to weight shift fully in order to take step  Ambulation/Gait                 Stairs             Wheelchair Mobility    Modified Rankin (Stroke Patients Only)       Balance Overall balance assessment: Needs assistance Sitting-balance support: Feet supported;No upper extremity supported Sitting balance-Leahy Scale: Fair Sitting balance - Comments: required on extremity assist for balance   Standing balance support: Bilateral upper extremity supported Standing balance-Leahy Scale: Zero Standing balance comment: Reliant on BUE support and maxA+2 to maintain static standing; able to tolerate 90-120 sec of standing                            Cognition Arousal/Alertness: Awake/alert Behavior During Therapy: Agitated Overall Cognitive Status: No family/caregiver present to determine baseline cognitive functioning  General Comments: Pt answering questions and following simple commands appropriately; internally distracted by pain and multiple complaints; becomes agitated when encouraged to perform tasks as independently as  possible. Difficult to reason with pt regarding mobility strategies/suggestions      Exercises      General Comments General comments (skin integrity, edema, etc.): SpO2 low 70s% as pt refusing to wear O2 [place] in nose, holding it mouth; pt convinced to place in nose with quick increase to 100% on 15L O2, decreased to 10L during session with SpO2 >/94%      Pertinent Vitals/Pain Pain Assessment: Faces Faces Pain Scale: Hurts whole lot Pain Location: left hip, right knee Pain Descriptors / Indicators: Grimacing;Moaning;Guarding;Constant;Discomfort;Sore Pain Intervention(s): Premedicated before session    Home Living                      Prior Function            PT Goals (current goals can now be found in the care plan section) Acute Rehab PT Goals Patient Stated Goal: to reduce pain and return to independent mobility Progress towards PT goals: Not progressing toward goals - comment (limited by pain, multiple complaints)    Frequency    Min 2X/week      PT Plan Discharge plan needs to be updated;Frequency needs to be updated    Co-evaluation PT/OT/SLP Co-Evaluation/Treatment: Yes Reason for Co-Treatment: Complexity of the patient's impairments (multi-system involvement);Necessary to address cognition/behavior during functional activity;For patient/therapist safety;To address functional/ADL transfers   OT goals addressed during session: ADL's and self-care      AM-PAC PT "6 Clicks" Mobility   Outcome Measure  Help needed turning from your back to your side while in a flat bed without using bedrails?: Total Help needed moving from lying on your back to sitting on the side of a flat bed without using bedrails?: Total Help needed moving to and from a bed to a chair (including a wheelchair)?: Total Help needed standing up from a chair using your arms (e.g., wheelchair or bedside chair)?: Total Help needed to walk in hospital room?: Total Help needed climbing 3-5  steps with a railing? : Total 6 Click Score: 6    End of Session Equipment Utilized During Treatment: Oxygen Activity Tolerance: Patient limited by pain Patient left: in bed;with call bell/phone within reach;with bed alarm set Nurse Communication: Mobility status PT Visit Diagnosis: Difficulty in walking, not elsewhere classified (R26.2);Other abnormalities of gait and mobility (R26.89);Pain     Time: [id number] PT Time Calculation (min) (ACUTE ONLY): 33 min  Charges:  $Therapeutic Activity: 8-22 mins                     Mabeline Caras, PT, DPT Acute Rehabilitation Services  Pager [phone] Office [place] 04/22/2021, 10:55 AM

## 2021-04-22 NOTE — Progress Notes (Addendum)
Daily Progress Note   Patient Name: Phillip Young       Date: 04/22/2021 DOB: 1986/03/20  Age: 35 y.o. MRN#: [id number] Attending Physician: Barb Merino, MD Primary Care Physician: [name], MD Admit Date: 04/12/2021  Reason for Consultation/Follow-up: Establishing goals of care  Subjective: Chart reviewed and discussed with bedside RN. Patient refused BiPAP overnight. PT session today was limited due to multiple complaints of left hip and right knee pain as well as generalized weakness.  I went to see patient at bedside. We discussed his joint pain at length. He does reports the pain has improved with the medication changes that were made yesterday. He expresses frustration that the cause of the pain is unknown.   Will verbalizes again to me today wanting to "get better". I asked him to elaborate on this. He states that his definition of better means "not being in pain". I reviewed with him the concept of a comfort path, which would allow [place] to more aggressively treat his pain. He states that comfort care means "he would die". I clarified that comfort care means treating symptoms of advanced/end-stage illness, but while allowing the natural course to occur. Discussed that the goal is comfort rather than prolonging life.   Will indicates he is not ready for a comfort path. He states he "knows what he is up against" but it is still hopeful. We discussed the importance of following recommendations from the medical team.    Length of Stay: 6  Current Medications: Scheduled Meds:  . allopurinol  100 mg Oral Daily  . amiodarone  200 mg Oral Daily  . apixaban  5 mg Oral BID  . Chlorhexidine Gluconate Cloth  6 each Topical Daily  . Chlorhexidine Gluconate Cloth  6 each Topical  Q0600  .  HYDROmorphone (DILAUDID) injection  1 mg Intravenous Once  . lanthanum  500 mg Oral TID WC  . midodrine  20 mg Oral TID WC  . predniSONE  40 mg Oral Q breakfast  . senna  2 tablet Oral BID    Continuous Infusions: . anticoagulant sodium citrate    . anticoagulant sodium citrate    . [START ON 04/23/2021]  ceFAZolin (ANCEF) IV      PRN Meds: acetaminophen **OR** acetaminophen, anticoagulant sodium citrate, cyclobenzaprine, ipratropium-albuterol, ondansetron **OR** ondansetron (ZOFRAN) IV, oxyCODONE  Physical Exam Vitals reviewed.  Constitutional:      General: He is not in acute distress.    Appearance: He is morbidly obese. He is ill-appearing.  Pulmonary:     Effort: Pulmonary effort is normal.  Neurological:     Mental Status: He is alert and oriented to person, place, and time.     Motor: Weakness present.            Vital Signs: BP 110/60 (BP Location: Right Wrist)   Pulse 75   Temp 97.8 F (36.6 C) (Oral)   Resp 19   Ht '5\' 11"'$  (1.803 m)   Wt (!) 189.8 kg   SpO2 94%   BMI 58.36 kg/m  SpO2: SpO2: 94 % O2 Device: O2 Device: High Flow Nasal Cannula O2 Flow Rate: O2 Flow Rate (L/min): 8 L/min  Intake/output summary:  Intake/Output Summary (Last 24 hours) at 04/22/2021 1419 Last data filed at 04/22/2021 1319 Gross per 24 hour  Intake 1310 ml  Output 1600 ml  Net -290 ml   LBM: Last BM Date: 04/12/21 Baseline Weight: Weight: (!) 189.4 kg Most recent weight: Weight: (!) 189.8 kg       Palliative Assessment/Data: PPS 30-40%       Palliative Care Assessment & Plan   HPI/Patient Profile: 35 y.o. male  with past medical history of severe biventricular CHF with EF < 20%, DM type 2, paroxysmal atrial flutter on Eliquis, OSA, morbid obesity, and ESRD on HD. He presented to the emergency department on 04/12/2021 with shortness of breath for the past 5-6 days. He noted his O2 saturation to be 78% at home, and was questioning whether his home O2 concentrator  was working properly. O2 saturation improved as soon as EMS placed him on oxygen. In the ED, chest x-ray showed pulmonary edema. BNP 658. He was admitted to [facility] with chronic respiratory failure.  He was also found to have MSSA bacteremia, likely from HD catheter which was subsequently removed.    Patient was hospitalized 02/11/21 - 03/26/21 with cardiogenic shock and cardiorenal syndrome with progressed to ESRD. He initially required CRRT, but was discharged on TTS dialysis.    Assessment: - MSSA bacteremia - chronic systolic CHF, fluid overload - ESRD on HD - acute on chronic respiratory failure with hypoxia - OSA - multi-joint pain - constipation  Recommendations/Plan: Full code full scope Patient does report improvement in pain since yesterday Recommend continuing oxycodone IR 15 mg every 6 hours prn Constipation: patient's last documented BM is 9/18. This is a chronic issue for him. He does not want a suppository. Miralax is not an ideal option due to fluid restriction. Start Linzess 145 mcg daily    Goals of Care and Additional Recommendations: Limitations on Scope of Treatment: Full Scope Treatment  Code Status: Full   Prognosis:  Unable to determine  Discharge Planning: To Be Determined    Thank you for allowing the Palliative Medicine Team to assist in the care of this patient.   Total Time 35 minutes Prolonged Time Billed  no       Greater than 50%  of this time was spent counseling and coordinating care related to the above assessment and plan.  Lavena Bullion, NP  Please contact Palliative Medicine Team phone at [phone] for questions and concerns.

## 2021-04-22 NOTE — Progress Notes (Signed)
Patient ID: Phillip Young, male   DOB: [birth year], 35 y.o.   MRN: [id number]  [facility] Progress Note   Assessment/ Plan:   MSSA bacteremia -likely catheter related bacteremia with positive blood cultures on 9/22 prompting removal of hemodialysis catheter on 9/24 due to exit site purulence to facilitate catheter holiday and clearance of bacteremia while on antibiotic therapy and with plans to replace hemodialysis catheter today after he declined yesterday because of hip pain. Fatigue/ multi-joint pain -suspect that this is osteoarthritis with need to rule out septic arthritis with bacteremia (imaging limited by his inability to lay still).  Increased oxycodone to 15 mg 3 times a day and start prednisone 40 mg daily for the next 5 days. Ordered dilaudid '1mg'$  pre TDC.  Shortness of breath/volume overload-reportedly his O2 equipment ran out or malfunctioned.  He continues to struggle with limiting IDWG. He unfortunately displays continued nonadherence with nocturnal CPAP and reeducation continues.  CCM consulted.  ESRD - on HD TTS.  Currently on catheter holiday to facilitate clearance of bacteremia and with plans for hemodialysis catheter placement today to resume dialysis. Hyperkalemia- s/p medical management overnight.   History of biventricular congestive heart failure-end-stage and not a candidate for assist device or transplant. Hypotension - on midodrine 20 tid Atrial fibrillation -appears rate controlled and is on Eliquis for anticoagulation.  Remains on amiodarone. CKD MBD-continue to reinforce renal diet along with phosphorus binder/adherence, elevated phosphorus level noted. Anemia of chronic kidney disease: Without overt blood loss, hemoglobin and hematocrit improving.  Subjective:   Attempted PT this AM- limited standing because of hip pain (Per PT assessment better than Monday). He reports some minimal improvement of pain overnight with adjustment of oxycodone/prednisone.     Objective:   BP 114/63   Pulse 81   Temp 97.8 F (36.6 C) (Oral)   Resp (!) 22   Ht '5\' 11"'$  (1.803 m)   Wt (!) 189.8 kg   SpO2 92%   BMI 58.36 kg/m   Physical Exam: Gen: Morbidly obese man who appears comfortable resting propped up in bed, PT at bedside CVS: Pulse regular rhythm, normal rate, S1 and S2 normal Resp: Anteriorly clear to auscultation without any rales or rhonchi, no wheeze Abd: Soft, obese, nontender, bowel sounds normal Ext: 1+ lower extremity edema  Labs: BMET Recent Labs  Lab 04/16/21 1526 04/18/21 0030 04/19/21 0048 04/20/21 0051 04/22/21 0043  NA 127* 128* 130* 129* 128*  K 4.6 6.2* 4.7 4.7 6.6*  CL 87* 90* 90* 91* 91*  CO2 '25 25 24 22 24  '$ GLUCOSE 143* 126* 112* 107* 165*  BUN 65* 61* 52* 72* 93*  CREATININE 7.87* 5.72* 5.17* 5.58* 5.06*  CALCIUM 8.7* 9.9 9.5 9.8 10.0  PHOS 8.8* 8.4* 6.8* 8.1* 9.3*   CBC Recent Labs  Lab 04/18/21 0030 04/19/21 0048 04/20/21 0051 04/22/21 0641  WBC 19.9* 16.2* 14.4* 14.7*  NEUTROABS 17.1* 13.0* 11.8*  --   HGB 8.9* 9.0* 10.3* 9.0*  HCT 28.2* 28.9* 32.2* 28.8*  MCV 100.7* 99.3 98.2 96.6  PLT 351 343 341 374      Medications:     allopurinol  100 mg Oral Daily   amiodarone  200 mg Oral Daily   apixaban  5 mg Oral BID   Chlorhexidine Gluconate Cloth  6 each Topical Daily   Chlorhexidine Gluconate Cloth  6 each Topical Q0600    HYDROmorphone (DILAUDID) injection  1 mg Intravenous Once   lanthanum  500 mg Oral TID WC  midodrine  20 mg Oral TID WC   predniSONE  40 mg Oral Q breakfast   senna  2 tablet Oral BID   Elmarie Shiley, MD 04/22/2021, 8:43 AM

## 2021-04-22 NOTE — Progress Notes (Addendum)
Patient refuses to wait any longer today for IR, he wants to [place] again---I have placed him NPO again tonight at midnite for possible HD Cath placement tomorrow 9/29--I have reminded patient of how critical it is to have dialysis as soon as possible to avoid intubation/respiratory crisis/ICU placement/possible death, patient is alert and oriented and understands the risks he is taking--Dr Laurena Bering, Dr Posey Pronto, Hemodialysis charge nurse and IR Nurse have been notified

## 2021-04-22 NOTE — Progress Notes (Signed)
[facility]   Reason for visit: Follow up on MSSA bacteremia  Interval History: repeat blood cultures remain ngtd, WBC 14.7, remains afebrile.  Refused placement of dialysis catheter yesterday.  Day 6 cefazolin  Physical Exam: Constitutional:  Vitals:   04/22/21 0715 04/22/21 1127  BP: 114/63 110/60  Pulse: 81 75  Resp: (!) 22 19  Temp: 97.8 F (36.6 C) 97.8 F (36.6 C)  SpO2: 92% 94%   patient appears in NAD Respiratory: Normal respiratory effort; CTA B Cardiovascular: RRR GI: obese  Review of Systems: Constitutional: negative for fevers and chills Gastrointestinal: negative for diarrhea  Lab Results  Component Value Date   WBC 14.7 (H) 04/22/2021   HGB 9.0 (L) 04/22/2021   HCT 28.8 (L) 04/22/2021   MCV 96.6 04/22/2021   PLT 374 04/22/2021    Lab Results  Component Value Date   CREATININE 5.06 (H) 04/22/2021   BUN 93 (H) 04/22/2021   NA 128 (L) 04/22/2021   K 6.2 (H) 04/22/2021   CL 91 (L) 04/22/2021   CO2 24 04/22/2021    Lab Results  Component Value Date   ALT 32 04/12/2021   AST 36 04/12/2021   ALKPHOS 84 04/12/2021     Microbiology: Recent Results (from the past 240 hour(s))  Resp Panel by RT-PCR (Flu A&B, Covid) Nasopharyngeal Swab     Status: None   Collection Time: 04/12/21  8:51 PM   Specimen: Nasopharyngeal Swab; Nasopharyngeal(NP) swabs in vial transport medium  Result Value Ref Range Status   SARS Coronavirus 2 by RT PCR NEGATIVE NEGATIVE Final    Comment: (NOTE) SARS-CoV-2 target nucleic acids are NOT DETECTED.  The SARS-CoV-2 RNA is generally detectable in upper respiratory specimens during the acute phase of infection. The lowest concentration of SARS-CoV-2 viral copies this assay can detect is 138 copies/mL. A negative result does not preclude SARS-Cov-2 infection and should not be used as the sole basis for treatment or other patient management decisions. A negative result may occur with  improper  specimen collection/handling, submission of specimen other than nasopharyngeal swab, presence of viral mutation(s) within the areas targeted by this assay, and inadequate number of viral copies(<138 copies/mL). A negative result must be combined with clinical observations, patient history, and epidemiological information. The expected result is Negative.  Fact Sheet for Patients:  EntrepreneurPulse.com.au  Fact Sheet for Healthcare Providers:  IncredibleEmployment.be  This test is no t yet approved or cleared by the [place] FDA and  has been authorized for detection and/or diagnosis of SARS-CoV-2 by FDA under an Emergency Use Authorization (EUA). This EUA will remain  in effect (meaning this test can be used) for the duration of the COVID-19 declaration under Section 564(b)(1) of the Act, 21 U.S.C.section 360bbb-3(b)(1), unless the authorization is terminated  or revoked sooner.       Influenza A by PCR NEGATIVE NEGATIVE Final   Influenza B by PCR NEGATIVE NEGATIVE Final    Comment: (NOTE) The Xpert Xpress SARS-CoV-2/FLU/RSV plus assay is intended as an aid in the diagnosis of influenza from Nasopharyngeal swab specimens and should not be used as a sole basis for treatment. Nasal washings and aspirates are unacceptable for Xpert Xpress SARS-CoV-2/FLU/RSV testing.  Fact Sheet for Patients: EntrepreneurPulse.com.au  Fact Sheet for Healthcare Providers: IncredibleEmployment.be  This test is not yet approved or cleared by the [place] FDA and has been authorized for detection and/or diagnosis of SARS-CoV-2 by FDA under an Emergency Use Authorization (EUA). This  EUA will remain in effect (meaning this test can be used) for the duration of the COVID-19 declaration under Section 564(b)(1) of the Act, 21 U.S.C. section 360bbb-3(b)(1), unless the authorization is terminated or revoked.  Performed at  [facility], [place] [address], [place], [place] [address]   MRSA Next Gen by PCR, Nasal     Status: None   Collection Time: 04/13/21  1:02 AM   Specimen: Nasal Mucosa; Nasal Swab  Result Value Ref Range Status   MRSA by PCR Next Gen NOT DETECTED NOT DETECTED Final    Comment: (NOTE) The GeneXpert MRSA Assay (FDA approved for NASAL specimens only), is one component of a comprehensive MRSA colonization surveillance program. It is not intended to diagnose MRSA infection nor to guide or monitor treatment for MRSA infections. Test performance is not FDA approved in patients less than 53 years old. Performed at [facility], [place] [address], [place], [place] [address]   Culture, blood (Routine X 2) w Reflex to ID Panel     Status: Abnormal   Collection Time: 04/16/21  9:42 PM   Specimen: BLOOD  Result Value Ref Range Status   Specimen Description BLOOD BLOOD LEFT ARM  Final   Special Requests   Final    BOTTLES DRAWN AEROBIC AND ANAEROBIC Blood Culture adequate volume   Culture  Setup Time   Final    GRAM POSITIVE COCCI IN BOTH AEROBIC AND ANAEROBIC BOTTLES CRITICAL RESULT CALLED TO, READ BACK BY AND VERIFIED WITH: PHARMD A.LOWLES AT [id number] ON 04/17/2021 BY T.SAAD. Performed at [facility], [place] [address], [place], [place] [address]    Culture STAPHYLOCOCCUS AUREUS (A)  Final   Report Status 04/19/2021 FINAL  Final   Organism ID, Bacteria STAPHYLOCOCCUS AUREUS  Final      Susceptibility   Staphylococcus aureus - MIC*    CIPROFLOXACIN <=0.5 SENSITIVE Sensitive     ERYTHROMYCIN <=0.25 SENSITIVE Sensitive     GENTAMICIN <=0.5 SENSITIVE Sensitive     OXACILLIN <=0.25 SENSITIVE Sensitive     TETRACYCLINE <=1 SENSITIVE Sensitive     VANCOMYCIN <=0.5 SENSITIVE Sensitive     TRIMETH/SULFA <=10 SENSITIVE Sensitive     CLINDAMYCIN <=0.25 SENSITIVE Sensitive     RIFAMPIN <=0.5 SENSITIVE Sensitive     Inducible Clindamycin NEGATIVE Sensitive     * STAPHYLOCOCCUS AUREUS   Culture, blood (Routine X 2) w Reflex to ID Panel     Status: Abnormal   Collection Time: 04/16/21  9:42 PM   Specimen: BLOOD  Result Value Ref Range Status   Specimen Description BLOOD BLOOD RIGHT ARM  Final   Special Requests   Final    BOTTLES DRAWN AEROBIC AND ANAEROBIC Blood Culture results may not be optimal due to an inadequate volume of blood received in culture bottles   Culture  Setup Time   Final    GRAM POSITIVE COCCI IN BOTH AEROBIC AND ANAEROBIC BOTTLES CRITICAL VALUE NOTED.  VALUE IS CONSISTENT WITH PREVIOUSLY REPORTED AND CALLED VALUE.    Culture (A)  Final    STAPHYLOCOCCUS AUREUS SUSCEPTIBILITIES PERFORMED ON PREVIOUS CULTURE WITHIN THE LAST 5 DAYS. Performed at [facility], [place] [address] [place], [place] [address]    Report Status 04/19/2021 FINAL  Final  Blood Culture ID Panel (Reflexed)     Status: Abnormal   Collection Time: 04/16/21  9:42 PM  Result Value Ref Range Status   Enterococcus faecalis NOT DETECTED NOT DETECTED Final  Enterococcus Faecium NOT DETECTED NOT DETECTED Final   Listeria monocytogenes NOT DETECTED NOT DETECTED Final   Staphylococcus species DETECTED (A) NOT DETECTED Final    Comment: CRITICAL RESULT CALLED TO, READ BACK BY AND VERIFIED WITH: PHARMD A.LOWLES AT 1121 ON 04/17/2021 BY T.SAAD.    Staphylococcus aureus (BCID) DETECTED (A) NOT DETECTED Final    Comment: CRITICAL RESULT CALLED TO, READ BACK BY AND VERIFIED WITH: PHARMD A.LOWLES AT 1121 ON 04/17/2021 BY T.SAAD.    Staphylococcus epidermidis NOT DETECTED NOT DETECTED Final   Staphylococcus lugdunensis NOT DETECTED NOT DETECTED Final   Streptococcus species NOT DETECTED NOT DETECTED Final   Streptococcus agalactiae NOT DETECTED NOT DETECTED Final   Streptococcus pneumoniae NOT DETECTED NOT DETECTED Final   Streptococcus pyogenes NOT DETECTED NOT DETECTED Final   A.calcoaceticus-baumannii NOT DETECTED NOT DETECTED Final   Bacteroides fragilis NOT DETECTED NOT DETECTED  Final   Enterobacterales NOT DETECTED NOT DETECTED Final   Enterobacter cloacae complex NOT DETECTED NOT DETECTED Final   Escherichia coli NOT DETECTED NOT DETECTED Final   Klebsiella aerogenes NOT DETECTED NOT DETECTED Final   Klebsiella oxytoca NOT DETECTED NOT DETECTED Final   Klebsiella pneumoniae NOT DETECTED NOT DETECTED Final   Proteus species NOT DETECTED NOT DETECTED Final   Salmonella species NOT DETECTED NOT DETECTED Final   Serratia marcescens NOT DETECTED NOT DETECTED Final   Haemophilus influenzae NOT DETECTED NOT DETECTED Final   Neisseria meningitidis NOT DETECTED NOT DETECTED Final   Pseudomonas aeruginosa NOT DETECTED NOT DETECTED Final   Stenotrophomonas maltophilia NOT DETECTED NOT DETECTED Final   Candida albicans NOT DETECTED NOT DETECTED Final   Candida auris NOT DETECTED NOT DETECTED Final   Candida glabrata NOT DETECTED NOT DETECTED Final   Candida krusei NOT DETECTED NOT DETECTED Final   Candida parapsilosis NOT DETECTED NOT DETECTED Final   Candida tropicalis NOT DETECTED NOT DETECTED Final   Cryptococcus neoformans/gattii NOT DETECTED NOT DETECTED Final   Meth resistant mecA/C and MREJ NOT DETECTED NOT DETECTED Final    Comment: Performed at [facility], [address], [place], [place] [address]  Culture, blood (Routine X 2) w Reflex to ID Panel     Status: None (Preliminary result)   Collection Time: 04/19/21 11:32 AM   Specimen: BLOOD RIGHT HAND  Result Value Ref Range Status   Specimen Description BLOOD RIGHT HAND  Final   Special Requests   Final    BOTTLES DRAWN AEROBIC AND ANAEROBIC Blood Culture adequate volume   Culture   Final    NO GROWTH 3 DAYS Performed at [facility], [place] [address], [place], [place] [address]    Report Status PENDING  Incomplete  Culture, blood (Routine X 2) w Reflex to ID Panel     Status: None (Preliminary result)   Collection Time: 04/19/21 11:37 AM   Specimen: BLOOD LEFT HAND  Result Value Ref Range  Status   Specimen Description BLOOD LEFT HAND  Final   Special Requests   Final    BOTTLES DRAWN AEROBIC AND ANAEROBIC Blood Culture adequate volume   Culture   Final    NO GROWTH 3 DAYS Performed at [facility], [place] [address] [place], [place] [address]    Report Status PENDING  Incomplete    Impression/Plan:  1. MSSA bacteremia - likely line related and repeat cultures ngtd.  He continues on cefazolin after dialysis and no changes.   TEE planned for tomorrow am  2.  Leukocytosis -  stable at 14.7.  will continue to monitor  3.  ESRD - continues on hemodialysis though refused catheter placement yesterday.  He is getting a catheter line holiday for #1.

## 2021-04-23 ENCOUNTER — Other Ambulatory Visit (HOSPITAL_COMMUNITY): Payer: BC Managed Care – PPO

## 2021-04-23 ENCOUNTER — Encounter (HOSPITAL_COMMUNITY): Payer: Self-pay | Admitting: Anesthesiology

## 2021-04-23 DIAGNOSIS — J9611 Chronic respiratory failure with hypoxia: Secondary | ICD-10-CM | POA: Diagnosis not present

## 2021-04-23 LAB — BASIC METABOLIC PANEL
Anion gap: 13 (ref 5–15)
BUN: 61 mg/dL — ABNORMAL HIGH (ref 6–20)
CO2: 26 mmol/L (ref 22–32)
Calcium: 9.8 mg/dL (ref 8.9–10.3)
Chloride: 93 mmol/L — ABNORMAL LOW (ref 98–111)
Creatinine, Ser: 3.15 mg/dL — ABNORMAL HIGH (ref 0.61–1.24)
GFR, Estimated: 25 mL/min — ABNORMAL LOW (ref >60)
Glucose, Bld: 103 mg/dL — ABNORMAL HIGH (ref 70–99)
Potassium: 4.9 mmol/L (ref 3.5–5.1)
Sodium: 132 mmol/L — ABNORMAL LOW (ref 135–145)

## 2021-04-23 LAB — PROTIME-INR
INR: 1.3 — ABNORMAL HIGH (ref 0.8–1.2)
Prothrombin Time: 16.3 seconds — ABNORMAL HIGH (ref 11.4–15.2)

## 2021-04-23 MED ORDER — SODIUM CHLORIDE 0.9 % IV SOLN
INTRAVENOUS | Status: DC
Start: 2021-04-23 — End: 2021-05-05

## 2021-04-23 MED ORDER — DARBEPOETIN ALFA 150 MCG/0.3ML IJ SOSY
150.0000 ug | PREFILLED_SYRINGE | INTRAMUSCULAR | Status: DC
  Administered 2021-04-24: 150 ug via INTRAVENOUS
  Filled 2021-04-23 (×2): qty 0.3

## 2021-04-23 NOTE — Anesthesia Preprocedure Evaluation (Deleted)
Anesthesia Evaluation  Patient identified by MRN, date of birth, ID band Patient awake    Reviewed: Allergy & Precautions, NPO status , Patient's Chart, lab work & pertinent test results  Airway        Dental   Pulmonary sleep apnea , former smoker,           Cardiovascular hypertension, Pt. on medications +CHF (EF 25-30%. )       Neuro/Psych negative neurological ROS     GI/Hepatic Neg liver ROS, GERD  ,  Endo/Other  Morbid obesity  Renal/GU ESRF and DialysisRenal disease     Musculoskeletal   Abdominal   Peds  Hematology  (+) anemia ,   Anesthesia Other Findings   Reproductive/Obstetrics                             Anesthesia Physical Anesthesia Plan Anesthesia Quick Evaluation

## 2021-04-23 NOTE — Progress Notes (Signed)
Currently PROGRESS NOTE    Phillip Young  [id number] DOB: 1986/07/08 DOA: 04/12/2021 PCP: [name], MD    Brief Narrative:  35 year old gentleman with severe biventricular systolic heart failure with known ejection fraction less than 20%, type 2 diabetes, paroxysmal A-flutter on Eliquis and  recently started on hemodialysis and recent extensive hospitalization with cardiogenic shock and cardiorenal syndrome progressed to ESRD presents back to the emergency room with shortness of breath, left wrist and right knee hurting.  Unable to ambulate around.  Home oxygen concentrator not working. At the emergency room saturations improved and back on 4 L oxygen.  Chest x-ray with pulmonary edema mostly chronic.  COVID-19 and flu negative.  Unable to ambulate.  Continues to complain of pain.  He was found to have MSSA bacteremia.  ID and nephrology consulted. After catheter holiday, he received on the permacath on 9/28 and overnight dialysis.   Assessment & Plan:   Principal Problem:   MSSA bacteremia Active Problems:   Morbid obesity ([name])   OSA (obstructive sleep apnea)   Chronic systolic CHF (congestive heart failure) ([name])   Paroxysmal atrial flutter ([name])   Hyponatremia   ESRD (end stage renal disease) on dialysis ([place])   Acute on chronic respiratory failure with hypoxia ([name])   Medical non-compliance   Hypotension  MSSA bacteremia: Arthralgias.  Possible septic arthritis but unable to prove.  See the scans without any evidence of abscess or collection. 4 out of 4 bottles positive for MSSA on admission Permacath removed, repeat blood cultures negative so far.  ID consulted.  Currently remains on cefazolin. Patient does have diffuse arthralgia, no evidence of palpable abscess, however difficult. TTE did not show any vegetation.  Too difficult to do TEE with current oxygen requirement.  He may end up on a ventilator. TEE was rescheduled for 9/29, postponed due to fluid  overload and being on 8 L oxygen. Difficult to do physical exam on his joints, however no obvious evidence of swelling or effusion. CT hip left normal. Pain is controlled and slightly improved today, on oral oxycodone and Flexeril along with short course of prednisone.  Acute on chronic respiratory failure with hypoxemia and hypercarbia: Due to malfunctioning concentrator at home.  Currently on 8 L oxygen and saturating adequate.  Keep on oxygen to keep saturation more than 90%.  Use BiPAP at night without interruption because of underlying obesity hypoventilation, sleep apnea and heart failure.  Stabilized.  ESRD and hemodialysis: Received new permacath.  Dialysis last night.  Followed by nephrology.  Biventricular heart failure/paroxysmal atrial fibrillation/hypotension: Stable.  Known EF of less than 20%.  Followed by heart failure team.  Intolerance to advanced heart failure therapies. Midodrine 20 mg 3 times a day.  Fluid management with dialysis.  Not a candidate for ICD as per heart failure team. Currently in sinus rhythm on amiodarone.  Therapeutic on Eliquis.  Morbid obesity: BMI more than 50.  Started on semaglutide.  Ambulatory dysfunction: Work with PT OT.  Not medically stable for discharge.  Ultimate plans to go home with home health PT OT.    DVT prophylaxis:  apixaban (ELIQUIS) tablet 5 mg   Code Status: Full code Family Communication: None Disposition Plan: Status is: Inpatient.   Dispo: The patient is from: Home              Anticipated d/c is to: SNF versus home with home health.              Patient currently is  not medically stable.   Difficult to place patient No         Consultants:  Cardiology Nephrology Infectious disease Palliative care  Procedures:  None  Antimicrobials:  Cefazolin   Subjective: Patient seen and examined.  He was slightly happy today because of pain improved from his left wrist and right knee.  Left is a still painful  and difficult to bear weight.  Overall he is very motivated and wants to work with therapies. Frustrated with TEE being canceled.  Objective: Vitals:   04/23/21 0950 04/23/21 0951 04/23/21 0955 04/23/21 1130  BP:    113/65  Pulse: 80 81 82 84  Resp:    20  Temp:    98.5 F (36.9 C)  TempSrc:    Oral  SpO2: (!) 87% (!) 88% 99% 98%  Weight:      Height:        Intake/Output Summary (Last 24 hours) at 04/23/2021 1306 Last data filed at 04/23/2021 1000 Gross per 24 hour  Intake 960 ml  Output 3500 ml  Net -2540 ml   Filed Weights   04/16/21 1913 04/23/21 0001 04/23/21 0310  Weight: (!) 189.8 kg (!) 194.6 kg (!) 192.2 kg    Examination:  General: Chronically sick looking.  Frail and debilitated.  Morbidly obese gentleman on 8 L of oxygen.  Able to talk in complete sentences.  In mild distress with mobility otherwise not in distress at rest. Cardiovascular: Regular rate rhythm.  Difficult to auscultate. Respiratory: No added sounds.  Difficult to auscultate. Gastrointestinal: Soft and nontender.  Obese and pendulous.  Bowel sounds present. Ext: Chronic edematous legs.  Nonpitting edema. Left wrist joint, right knee without tenderness or effusion.  Left hip with no obvious palpable tenderness.   Data Reviewed: I have personally reviewed following labs and imaging studies  CBC: Recent Labs  Lab 04/16/21 1526 04/18/21 0030 04/19/21 0048 04/20/21 0051 04/22/21 0641  WBC 15.3* 19.9* 16.2* 14.4* 14.7*  NEUTROABS  --  17.1* 13.0* 11.8*  --   HGB 8.4* 8.9* 9.0* 10.3* 9.0*  HCT 27.7* 28.2* 28.9* 32.2* 28.8*  MCV 101.8* 100.7* 99.3 98.2 96.6  PLT 319 351 343 341 [id number]   Basic Metabolic Panel: Recent Labs  Lab 04/16/21 1526 04/18/21 0030 04/19/21 0048 04/20/21 0051 04/22/21 0043 04/22/21 1040 04/23/21 0648  NA 127* 128* 130* 129* 128*  --  132*  K 4.6 6.2* 4.7 4.7 6.6* 6.2* 4.9  CL 87* 90* 90* 91* 91*  --  93*  CO2 '25 25 24 22 24  '$ --  26  GLUCOSE 143* 126* 112* 107*  165*  --  103*  BUN 65* 61* 52* 72* 93*  --  61*  CREATININE 7.87* 5.72* 5.17* 5.58* 5.06*  --  3.15*  CALCIUM 8.7* 9.9 9.5 9.8 10.0  --  9.8  PHOS 8.8* 8.4* 6.8* 8.1* 9.3*  --   --    GFR: Estimated Creatinine Clearance: 56 mL/min (A) (by C-G formula based on SCr of 3.15 mg/dL (H)). Liver Function Tests: Recent Labs  Lab 04/16/21 1526 04/18/21 0030 04/19/21 0048 04/20/21 0051 04/22/21 0043  ALBUMIN 2.9* 2.8* 2.6* 2.6* 2.6*   No results for input(s): LIPASE, AMYLASE in the last 168 hours.  No results for input(s): AMMONIA in the last 168 hours. Coagulation Profile: Recent Labs  Lab 04/23/21 0648  INR 1.3*   Cardiac Enzymes: No results for input(s): CKTOTAL, CKMB, CKMBINDEX, TROPONINI in the last 168 hours. BNP (last 3 results) No  results for input(s): PROBNP in the last 8760 hours. HbA1C: No results for input(s): HGBA1C in the last 72 hours. CBG: No results for input(s): GLUCAP in the last 168 hours. Lipid Profile: No results for input(s): CHOL, HDL, LDLCALC, TRIG, CHOLHDL, LDLDIRECT in the last 72 hours. Thyroid Function Tests: No results for input(s): TSH, T4TOTAL, FREET4, T3FREE, THYROIDAB in the last 72 hours. Anemia Panel: No results for input(s): VITAMINB12, FOLATE, FERRITIN, TIBC, IRON, RETICCTPCT in the last 72 hours. Sepsis Labs: No results for input(s): PROCALCITON, LATICACIDVEN in the last 168 hours.  Recent Results (from the past 240 hour(s))  Culture, blood (Routine X 2) w Reflex to ID Panel     Status: Abnormal   Collection Time: 04/16/21  9:42 PM   Specimen: BLOOD  Result Value Ref Range Status   Specimen Description BLOOD BLOOD LEFT ARM  Final   Special Requests   Final    BOTTLES DRAWN AEROBIC AND ANAEROBIC Blood Culture adequate volume   Culture  Setup Time   Final    GRAM POSITIVE COCCI IN BOTH AEROBIC AND ANAEROBIC BOTTLES CRITICAL RESULT CALLED TO, READ BACK BY AND VERIFIED WITH: PHARMD A.LOWLES AT [id number] ON 04/17/2021 BY T.SAAD. Performed at  [facility], [place] [address], [place], [place] [address]    Culture STAPHYLOCOCCUS AUREUS (A)  Final   Report Status 04/19/2021 FINAL  Final   Organism ID, Bacteria STAPHYLOCOCCUS AUREUS  Final      Susceptibility   Staphylococcus aureus - MIC*    CIPROFLOXACIN <=0.5 SENSITIVE Sensitive     ERYTHROMYCIN <=0.25 SENSITIVE Sensitive     GENTAMICIN <=0.5 SENSITIVE Sensitive     OXACILLIN <=0.25 SENSITIVE Sensitive     TETRACYCLINE <=1 SENSITIVE Sensitive     VANCOMYCIN <=0.5 SENSITIVE Sensitive     TRIMETH/SULFA <=10 SENSITIVE Sensitive     CLINDAMYCIN <=0.25 SENSITIVE Sensitive     RIFAMPIN <=0.5 SENSITIVE Sensitive     Inducible Clindamycin NEGATIVE Sensitive     * STAPHYLOCOCCUS AUREUS  Culture, blood (Routine X 2) w Reflex to ID Panel     Status: Abnormal   Collection Time: 04/16/21  9:42 PM   Specimen: BLOOD  Result Value Ref Range Status   Specimen Description BLOOD BLOOD RIGHT ARM  Final   Special Requests   Final    BOTTLES DRAWN AEROBIC AND ANAEROBIC Blood Culture results may not be optimal due to an inadequate volume of blood received in culture bottles   Culture  Setup Time   Final    GRAM POSITIVE COCCI IN BOTH AEROBIC AND ANAEROBIC BOTTLES CRITICAL VALUE NOTED.  VALUE IS CONSISTENT WITH PREVIOUSLY REPORTED AND CALLED VALUE.    Culture (A)  Final    STAPHYLOCOCCUS AUREUS SUSCEPTIBILITIES PERFORMED ON PREVIOUS CULTURE WITHIN THE LAST 5 DAYS. Performed at [facility], [place] [address], [place], [place] [address]    Report Status 04/19/2021 FINAL  Final  Blood Culture ID Panel (Reflexed)     Status: Abnormal   Collection Time: 04/16/21  9:42 PM  Result Value Ref Range Status   Enterococcus faecalis NOT DETECTED NOT DETECTED Final   Enterococcus Faecium NOT DETECTED NOT DETECTED Final   Listeria monocytogenes NOT DETECTED NOT DETECTED Final   Staphylococcus species DETECTED (A) NOT DETECTED Final    Comment: CRITICAL RESULT CALLED TO, READ BACK BY AND  VERIFIED WITH: PHARMD A.LOWLES AT 1121 ON 04/17/2021 BY T.SAAD.    Staphylococcus aureus (BCID) DETECTED (A) NOT DETECTED Final    Comment:  CRITICAL RESULT CALLED TO, READ BACK BY AND VERIFIED WITH: PHARMD A.LOWLES AT 1121 ON 04/17/2021 BY T.SAAD.    Staphylococcus epidermidis NOT DETECTED NOT DETECTED Final   Staphylococcus lugdunensis NOT DETECTED NOT DETECTED Final   Streptococcus species NOT DETECTED NOT DETECTED Final   Streptococcus agalactiae NOT DETECTED NOT DETECTED Final   Streptococcus pneumoniae NOT DETECTED NOT DETECTED Final   Streptococcus pyogenes NOT DETECTED NOT DETECTED Final   A.calcoaceticus-baumannii NOT DETECTED NOT DETECTED Final   Bacteroides fragilis NOT DETECTED NOT DETECTED Final   Enterobacterales NOT DETECTED NOT DETECTED Final   Enterobacter cloacae complex NOT DETECTED NOT DETECTED Final   Escherichia coli NOT DETECTED NOT DETECTED Final   Klebsiella aerogenes NOT DETECTED NOT DETECTED Final   Klebsiella oxytoca NOT DETECTED NOT DETECTED Final   Klebsiella pneumoniae NOT DETECTED NOT DETECTED Final   Proteus species NOT DETECTED NOT DETECTED Final   Salmonella species NOT DETECTED NOT DETECTED Final   Serratia marcescens NOT DETECTED NOT DETECTED Final   Haemophilus influenzae NOT DETECTED NOT DETECTED Final   Neisseria meningitidis NOT DETECTED NOT DETECTED Final   Pseudomonas aeruginosa NOT DETECTED NOT DETECTED Final   Stenotrophomonas maltophilia NOT DETECTED NOT DETECTED Final   Candida albicans NOT DETECTED NOT DETECTED Final   Candida auris NOT DETECTED NOT DETECTED Final   Candida glabrata NOT DETECTED NOT DETECTED Final   Candida krusei NOT DETECTED NOT DETECTED Final   Candida parapsilosis NOT DETECTED NOT DETECTED Final   Candida tropicalis NOT DETECTED NOT DETECTED Final   Cryptococcus neoformans/gattii NOT DETECTED NOT DETECTED Final   Meth resistant mecA/C and MREJ NOT DETECTED NOT DETECTED Final    Comment: Performed at [facility], [address] [place], [place] [address]  Culture, blood (Routine X 2) w Reflex to ID Panel     Status: None (Preliminary result)   Collection Time: 04/19/21 11:32 AM   Specimen: BLOOD RIGHT HAND  Result Value Ref Range Status   Specimen Description BLOOD RIGHT HAND  Final   Special Requests   Final    BOTTLES DRAWN AEROBIC AND ANAEROBIC Blood Culture adequate volume   Culture   Final    NO GROWTH 3 DAYS Performed at [facility], [place] [address] [place], [place] [address]    Report Status PENDING  Incomplete  Culture, blood (Routine X 2) w Reflex to ID Panel     Status: None (Preliminary result)   Collection Time: 04/19/21 11:37 AM   Specimen: BLOOD LEFT HAND  Result Value Ref Range Status   Specimen Description BLOOD LEFT HAND  Final   Special Requests   Final    BOTTLES DRAWN AEROBIC AND ANAEROBIC Blood Culture adequate volume   Culture   Final    NO GROWTH 3 DAYS Performed at [facility], [place] [address] [place], [place] [address]    Report Status PENDING  Incomplete         Radiology Studies: CT KNEE RIGHT WO CONTRAST  Result Date: 04/22/2021 CLINICAL DATA:  Right knee osteomyelitis suspected. Right knee pain. EXAM: CT OF THE RIGHT KNEE WITHOUT CONTRAST TECHNIQUE: Multidetector CT imaging of the RIGHT knee was performed according to the standard protocol. Multiplanar CT image reconstructions were also generated. COMPARISON:  None. FINDINGS: Bones/Joint/Cartilage No fracture or dislocation. Normal alignment. Moderate joint effusion. Osteochondral lesion versus erosion involving the trochlear groove. No other areas of bone destruction or erosive changes. No aggressive osseous lesion. Ligaments Ligaments are suboptimally evaluated by  CT. Muscles and Tendons Muscles are normal. Patellar tendon and quadriceps tendon are intact. Soft tissue No fluid collection or hematoma.  No soft tissue mass. IMPRESSION: 1. Osteochondral lesion versus erosion involving the  trochlear groove. Erosion may be secondary to an inflammatory or crystalline arthropathy versus less likely infectious etiology. If there is further clinical concern, recommend an MRI of the right knee without and with intravenous contrast. 2. Moderate joint effusion. Electronically Signed   By: Kathreen Devoid M.D.   On: 04/22/2021 18:45   IR Fluoro Guide CV Line Right  Addendum Date: 04/23/2021   ADDENDUM REPORT: 04/23/2021 11:06 ADDENDUM: Correction: Sodium citrate was placed within the catheter lumens NOT heparin. Electronically Signed   By: Markus Daft M.D.   On: 04/23/2021 11:06   Result Date: 04/23/2021 INDICATION: 35 year old with end-stage renal disease and needs new dialysis access. Patient had a catheter holiday due to infection. EXAM: FLUOROSCOPIC AND ULTRASOUND GUIDED PLACEMENT OF A TUNNELED DIALYSIS CATHETER Physician: Stephan Minister. Anselm Pancoast, MD MEDICATIONS: Inpatient and receiving antibiotics. ANESTHESIA/SEDATION: Local anesthetic, 1% lidocaine FLUOROSCOPY TIME:  Fluoroscopy Time: 3 minutes 30 seconds (133 mGy). COMPLICATIONS: None immediate. PROCEDURE: The procedure was explained to the patient. The risks and benefits of the procedure were discussed and the patient's questions were addressed. Informed consent was obtained from the patient. The patient was placed supine on the interventional table. Ultrasound confirmed a patent right internal jugular vein. Ultrasound image obtained for documentation. The right neck and chest was prepped and draped in a sterile fashion. Maximal barrier sterile technique was utilized including caps, mask, sterile gowns, sterile gloves, sterile drape, hand hygiene and skin antiseptic. The right neck was anesthetized with 1% lidocaine. A small incision was made with #11 blade scalpel. A 21 gauge needle directed into the right internal jugular vein with ultrasound guidance. A micropuncture dilator set was placed. A 23 cm tip to cuff Palindrome catheter was selected. The skin below  the right clavicle was anesthetized and a small incision was made with an #11 blade scalpel. A subcutaneous tunnel was formed to the vein dermatotomy site. The catheter was brought through the tunnel. The vein dermatotomy site was dilated to accommodate a peel-away sheath. The catheter was placed through the peel-away sheath and directed into the central venous structures. The tip of the catheter was placed at superior cavoatrial junction with fluoroscopy. Fluoroscopic images were obtained for documentation. Both lumens were found to aspirate and flush well. The proper amount of heparin was flushed in both lumens. The vein dermatotomy site was closed using a single layer of absorbable suture and Dermabond. The catheter was secured to the skin using Prolene suture. IMPRESSION: Successful placement of a right jugular tunneled dialysis catheter using ultrasound and fluoroscopic guidance. Electronically Signed: By: Markus Daft M.D. On: 04/23/2021 08:08   IR US Guide Vasc Access Right  Addendum Date: 04/23/2021   ADDENDUM REPORT: 04/23/2021 11:06 ADDENDUM: Correction: Sodium citrate was placed within the catheter lumens NOT heparin. Electronically Signed   By: Markus Daft M.D.   On: 04/23/2021 11:06   Result Date: 04/23/2021 INDICATION: 35 year old with end-stage renal disease and needs new dialysis access. Patient had a catheter holiday due to infection. EXAM: FLUOROSCOPIC AND ULTRASOUND GUIDED PLACEMENT OF A TUNNELED DIALYSIS CATHETER Physician: Stephan Minister. Anselm Pancoast, MD MEDICATIONS: Inpatient and receiving antibiotics. ANESTHESIA/SEDATION: Local anesthetic, 1% lidocaine FLUOROSCOPY TIME:  Fluoroscopy Time: 3 minutes 30 seconds (133 mGy). COMPLICATIONS: None immediate. PROCEDURE: The procedure was explained to the patient. The risks and benefits of  the procedure were discussed and the patient's questions were addressed. Informed consent was obtained from the patient. The patient was placed supine on the interventional  table. Ultrasound confirmed a patent right internal jugular vein. Ultrasound image obtained for documentation. The right neck and chest was prepped and draped in a sterile fashion. Maximal barrier sterile technique was utilized including caps, mask, sterile gowns, sterile gloves, sterile drape, hand hygiene and skin antiseptic. The right neck was anesthetized with 1% lidocaine. A small incision was made with #11 blade scalpel. A 21 gauge needle directed into the right internal jugular vein with ultrasound guidance. A micropuncture dilator set was placed. A 23 cm tip to cuff Palindrome catheter was selected. The skin below the right clavicle was anesthetized and a small incision was made with an #11 blade scalpel. A subcutaneous tunnel was formed to the vein dermatotomy site. The catheter was brought through the tunnel. The vein dermatotomy site was dilated to accommodate a peel-away sheath. The catheter was placed through the peel-away sheath and directed into the central venous structures. The tip of the catheter was placed at superior cavoatrial junction with fluoroscopy. Fluoroscopic images were obtained for documentation. Both lumens were found to aspirate and flush well. The proper amount of heparin was flushed in both lumens. The vein dermatotomy site was closed using a single layer of absorbable suture and Dermabond. The catheter was secured to the skin using Prolene suture. IMPRESSION: Successful placement of a right jugular tunneled dialysis catheter using ultrasound and fluoroscopic guidance. Electronically Signed: By: Markus Daft M.D. On: 04/23/2021 08:08        Scheduled Meds:  allopurinol  100 mg Oral Daily   amiodarone  200 mg Oral Daily   apixaban  5 mg Oral BID   Chlorhexidine Gluconate Cloth  6 each Topical Daily   Chlorhexidine Gluconate Cloth  6 each Topical Q0600   [START ON 04/24/2021] darbepoetin (ARANESP) injection - DIALYSIS  150 mcg Intravenous Q Fri-HD   lanthanum  500 mg Oral  TID WC   linaclotide  145 mcg Oral QAC breakfast   midodrine  20 mg Oral TID WC   predniSONE  40 mg Oral Q breakfast   Continuous Infusions:  sodium chloride Stopped (04/23/21 0830)   anticoagulant sodium citrate     anticoagulant sodium citrate     anticoagulant sodium citrate      ceFAZolin (ANCEF) IV 1 g (04/23/21 1134)     LOS: 7 days    Time spent: 30 minutes    Barb Merino, MD [facility] Pager [phone]

## 2021-04-23 NOTE — Progress Notes (Signed)
Pt refused BIPAP/CPAP tonight. Rt will cont to monitor.

## 2021-04-23 NOTE — Progress Notes (Signed)
Patient ID: Phillip Young, male   DOB: 06-20-1986, 35 y.o.   MRN: [id number]  [facility] Progress Note   Assessment/ Plan:   MSSA bacteremia -likely catheter related bacteremia with positive blood cultures on 9/22 prompting removal of hemodialysis catheter on 9/24 due to exit site purulence to facilitate catheter holiday and clearance of bacteremia while on antibiotic therapy and had TDC replaced by IR yesterday.  TEE deferred at this time to allow for optimization of volume/respiratory status. Fatigue/ multi-joint pain -suspect that this is osteoarthritis with need to rule out septic arthritis with bacteremia (imaging limited by his inability to lay still because of pain).  Continue current oxycodone and 5-day course of prednisone for pain/inflammation management.  Shortness of breath/volume overload-reportedly his O2 equipment ran out or malfunctioned.  He continues to struggle with limiting IDWG. He unfortunately displays continued nonadherence with nocturnal CPAP and reeducation continues.  Continue strict fluid restriction. ESRD - on HD TTS and had his hemodialysis done overnight yesterday-we will defer his next hemodialysis due tomorrow and then again on Saturday to get him back onto schedule.  I appreciate the interventional radiology service in their help for placing his catheter. History of biventricular congestive heart failure-end-stage and not a candidate for assist device or transplant. Hypotension - on midodrine 20 tid Atrial fibrillation -appears rate controlled and is on Eliquis for anticoagulation.  Remains on amiodarone. CKD MBD-continue to reinforce renal diet along with phosphorus binder/adherence, elevated phosphorus level noted. Anemia of chronic kidney disease: Without overt blood loss, hemoglobin and hematocrit are lower and I will restart his ESA.  Subjective:   Underwent placement of hemodialysis catheter yesterday afternoon after some resistance earlier in  the day for various reasons.  He had hemodialysis done overnight.   Objective:   BP 122/70 (BP Location: Right Wrist)   Pulse 80   Temp 98.4 F (36.9 C) (Oral)   Resp 20   Ht '5\' 11"'$  (1.803 m)   Wt (!) 192.2 kg   SpO2 98%   BMI 59.10 kg/m   Physical Exam: Gen: Morbidly obese man who appears comfortable resting propped up in bed, PT at bedside CVS: Pulse regular rhythm, normal rate, S1 and S2 normal Resp: Anteriorly clear to auscultation without any rales or rhonchi, no wheeze Abd: Soft, obese, nontender, bowel sounds normal Ext: 1+ lower extremity edema  Labs: BMET Recent Labs  Lab 04/16/21 1526 04/18/21 0030 04/19/21 0048 04/20/21 0051 04/22/21 0043 04/22/21 1040 04/23/21 0648  NA 127* 128* 130* 129* 128*  --  132*  K 4.6 6.2* 4.7 4.7 6.6* 6.2* 4.9  CL 87* 90* 90* 91* 91*  --  93*  CO2 '25 25 24 22 24  '$ --  26  GLUCOSE 143* 126* 112* 107* 165*  --  103*  BUN 65* 61* 52* 72* 93*  --  61*  CREATININE 7.87* 5.72* 5.17* 5.58* 5.06*  --  3.15*  CALCIUM 8.7* 9.9 9.5 9.8 10.0  --  9.8  PHOS 8.8* 8.4* 6.8* 8.1* 9.3*  --   --    CBC Recent Labs  Lab 04/18/21 0030 04/19/21 0048 04/20/21 0051 04/22/21 0641  WBC 19.9* 16.2* 14.4* 14.7*  NEUTROABS 17.1* 13.0* 11.8*  --   HGB 8.9* 9.0* 10.3* 9.0*  HCT 28.2* 28.9* 32.2* 28.8*  MCV 100.7* 99.3 98.2 96.6  PLT 351 343 341 374      Medications:     allopurinol  100 mg Oral Daily   amiodarone  200 mg Oral Daily  apixaban  5 mg Oral BID   Chlorhexidine Gluconate Cloth  6 each Topical Daily   Chlorhexidine Gluconate Cloth  6 each Topical Q0600   lanthanum  500 mg Oral TID WC   linaclotide  145 mcg Oral QAC breakfast   midodrine  20 mg Oral TID WC   predniSONE  40 mg Oral Q breakfast   Elmarie Shiley, MD 04/23/2021, 8:48 AM

## 2021-04-23 NOTE — Progress Notes (Signed)
Planned TEE today.  D/w Dr Sloan Leiter and Dr Ola Spurr, he is currently requiring 8L O2.  Had HD line placed yesterday, planning for HD today.  K up to 6.6 yesterday, improved to 4.9 today with medical management. Recommend holding off on TEE until respiratory status improved.  Suspect O2 requirement will improve with volume removal from restarting HD today.  No availability for TEE tomorrow and suspect will likely need a few sessions of HD before TEE, will tentatively add on for TEE on Monday 10/3 at 12:30pm.

## 2021-04-24 DIAGNOSIS — R7881 Bacteremia: Secondary | ICD-10-CM | POA: Diagnosis not present

## 2021-04-24 DIAGNOSIS — B9561 Methicillin susceptible Staphylococcus aureus infection as the cause of diseases classified elsewhere: Secondary | ICD-10-CM | POA: Diagnosis not present

## 2021-04-24 DIAGNOSIS — J9611 Chronic respiratory failure with hypoxia: Secondary | ICD-10-CM | POA: Diagnosis not present

## 2021-04-24 LAB — CBC
HCT: 28.3 % — ABNORMAL LOW (ref 39.0–52.0)
Hemoglobin: 8.7 g/dL — ABNORMAL LOW (ref 13.0–17.0)
MCH: 31.1 pg (ref 26.0–34.0)
MCHC: 30.7 g/dL (ref 30.0–36.0)
MCV: 101.1 fL — ABNORMAL HIGH (ref 80.0–100.0)
Platelets: 404 10*3/uL — ABNORMAL HIGH (ref 150–400)
RBC: 2.8 MIL/uL — ABNORMAL LOW (ref 4.22–5.81)
RDW: 18.7 % — ABNORMAL HIGH (ref 11.5–15.5)
WBC: 11.3 10*3/uL — ABNORMAL HIGH (ref 4.0–10.5)
nRBC: 0.3 % — ABNORMAL HIGH (ref 0.0–0.2)

## 2021-04-24 LAB — CULTURE, BLOOD (ROUTINE X 2)
Culture: NO GROWTH
Culture: NO GROWTH
Special Requests: ADEQUATE
Special Requests: ADEQUATE

## 2021-04-24 LAB — RENAL FUNCTION PANEL
Albumin: 2.6 g/dL — ABNORMAL LOW (ref 3.5–5.0)
Anion gap: 13 (ref 5–15)
BUN: 80 mg/dL — ABNORMAL HIGH (ref 6–20)
CO2: 27 mmol/L (ref 22–32)
Calcium: 9.7 mg/dL (ref 8.9–10.3)
Chloride: 92 mmol/L — ABNORMAL LOW (ref 98–111)
Creatinine, Ser: 3.37 mg/dL — ABNORMAL HIGH (ref 0.61–1.24)
GFR, Estimated: 23 mL/min — ABNORMAL LOW (ref >60)
Glucose, Bld: 139 mg/dL — ABNORMAL HIGH (ref 70–99)
Phosphorus: 6 mg/dL — ABNORMAL HIGH (ref 2.5–4.6)
Potassium: 5 mmol/L (ref 3.5–5.1)
Sodium: 132 mmol/L — ABNORMAL LOW (ref 135–145)

## 2021-04-24 MED ORDER — SALINE SPRAY 0.65 % NA SOLN
1.0000 | NASAL | Status: DC | PRN
  Filled 2021-04-24 (×2): qty 44

## 2021-04-24 NOTE — Progress Notes (Signed)
   04/24/21 1106  Vitals  Temp 98 F (36.7 C)  Temp Source Oral  BP (!) 94/57  BP Location Right Wrist  BP Method Automatic  Patient Position (if appropriate) Lying  Pulse Rate 76  Pulse Rate Source Monitor  ECG Heart Rate 76  Resp 18  Oxygen Therapy  SpO2 99 %  O2 Device HFNC  O2 Flow Rate (L/min) 11 L/min  Dialysis Weight  Weight (!) 183.7 kg  Type of Weight Post-Dialysis  Post-Hemodialysis Assessment  Rinseback Volume (mL) 250 mL  KECN 250 V  Dialyzer Clearance Lightly streaked  Duration of HD Treatment -hour(s) 3.95 hour(s)  Hemodialysis Intake (mL) 500 mL  UF Total -Machine (mL) 6074 mL  Net UF (mL) 5574 mL  Tolerated HD Treatment Yes  Post-Hemodialysis Comments tx completed-pt stable  Hemodialysis Catheter Right Internal jugular Double lumen Permanent (Tunneled)  Placement Date/Time: 04/22/21 1616   Time Out: Correct patient;Correct site;Correct procedure  Maximum sterile barrier precautions: Sterile gown;Mask;Cap;Large sterile sheet;Hand hygiene;Sterile gloves  Site Prep: Chlorhexidine (preferred);Skin Prep C...  Site Condition No complications  Blue Lumen Status Flushed;Dead end cap in place  Red Lumen Status Flushed;Dead end cap in place  Catheter fill solution 4% Sodium Citrate  Catheter fill volume (Arterial) 1.9 cc  Catheter fill volume (Venous) 1.9  Dressing Type Transparent  Dressing Status Clean;Dry;Intact  Antimicrobial disc in place? Yes  Interventions Other (Comment) (assessed)  Drainage Description Other (Comment) (dried blood near antimicrobial disc)  Dressing Change Due 04/30/21  Post treatment catheter status Capped and Clamped  HD tx complete-pt stable. Pt noted with issues with HD cath, high arterial pressures noted, despite switching blood lines, BFR decreased. Brooke Bonito on unit and made aware.

## 2021-04-24 NOTE — Progress Notes (Signed)
Pt stated that he broke his tooth overnight eating cereal. I (upper right pre-molar). I placed tooth in a sterile container and will hand this information off to on coming RN

## 2021-04-24 NOTE — Progress Notes (Signed)
[facility]  Date of Admission:  04/12/2021     Total days of antibiotics 8         ASSESSMENT:  Mr. Brabec 04/19/21 cultures have finalized without growth. Awaiting TEE planned on 10/3 to rule out endocarditis. Appears source control achieved with removal of prior HD catheter and line holiday. Continue current dose of Cefazolin. Final recommendations pending TEE results. Continue remaining care per Nephrology and primary team.   PLAN:  Continue current dose of Cefazolin.  Await TEE results to determine duration of therapy.  Remaining care per Nephrology and primary team.   Principal Problem:   MSSA bacteremia Active Problems:   Morbid obesity ([name])   GERD without esophagitis   OSA (obstructive sleep apnea)   Chronic systolic CHF (congestive heart failure) ([name])   Paroxysmal atrial flutter ([name])   Hyponatremia   ESRD (end stage renal disease) on dialysis ([facility])   Acute on chronic respiratory failure with hypoxia ([name])   Medical non-compliance   Hypotension    allopurinol  100 mg Oral Daily   amiodarone  200 mg Oral Daily   apixaban  5 mg Oral BID   Chlorhexidine Gluconate Cloth  6 each Topical Daily   Chlorhexidine Gluconate Cloth  6 each Topical Q0600   darbepoetin (ARANESP) injection - DIALYSIS  150 mcg Intravenous Q Fri-HD   lanthanum  500 mg Oral TID WC   linaclotide  145 mcg Oral QAC breakfast   midodrine  20 mg Oral TID WC   predniSONE  40 mg Oral Q breakfast    SUBJECTIVE:  Afebrile overnight with no acute events.   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  Respiratory:  Negative for cough, shortness of breath and wheezing.   Cardiovascular:  Negative for chest pain and leg swelling.   Gastrointestinal:  Negative for abdominal pain, constipation, diarrhea, nausea and vomiting.  Skin:  Negative for rash.     OBJECTIVE: Vitals:   04/24/21 0900 04/24/21 0930 04/24/21 1000 04/24/21 1030  BP: (!) 109/58 103/61 (!) 105/48 (!) 94/49  Pulse: 74 81 79 76  Resp: 18 19 (!) 22 18  Temp:      TempSrc:      SpO2:      Weight:      Height:       Body mass index is 58.42 kg/m.  Physical Exam Constitutional:      General: He is not in acute distress.    Appearance: He is well-developed. He is obese.     Interventions: Nasal cannula in place.     Comments: Lying in bed with head of bed elevated; pleasant; lethargic   Cardiovascular:     Rate and Rhythm: Normal rate and regular rhythm.     Comments: HD catheter in right upper chest currently infusing.  Pulmonary:     Effort: Pulmonary effort is normal.     Breath sounds: Normal breath sounds.  Skin:    General: Skin is warm and dry.  Neurological:     Mental Status: He is oriented to person, place, and time. He is lethargic.  Psychiatric:        Mood and Affect: Mood normal.    Lab  Results Lab Results  Component Value Date   WBC 11.3 (H) 04/24/2021   HGB 8.7 (L) 04/24/2021   HCT 28.3 (L) 04/24/2021   MCV 101.1 (H) 04/24/2021   PLT 404 (H) 04/24/2021    Lab Results  Component Value Date   CREATININE 3.37 (H) 04/24/2021   BUN 80 (H) 04/24/2021   NA 132 (L) 04/24/2021   K 5.0 04/24/2021   CL 92 (L) 04/24/2021   CO2 27 04/24/2021    Lab Results  Component Value Date   ALT 32 04/12/2021   AST 36 04/12/2021   ALKPHOS 84 04/12/2021   BILITOT 1.2 04/12/2021     Microbiology: Recent Results (from the past 240 hour(s))  Culture, blood (Routine X 2) w Reflex to ID Panel     Status: Abnormal   Collection Time: 04/16/21  9:42 PM   Specimen: BLOOD  Result Value Ref Range Status   Specimen Description BLOOD BLOOD LEFT ARM  Final   Special Requests   Final    BOTTLES DRAWN AEROBIC AND ANAEROBIC Blood  Culture adequate volume   Culture  Setup Time   Final    GRAM POSITIVE COCCI IN BOTH AEROBIC AND ANAEROBIC BOTTLES CRITICAL RESULT CALLED TO, READ BACK BY AND VERIFIED WITH: PHARMD A.LOWLES AT [id number] ON 04/17/2021 BY T.SAAD. Performed at [facility], [place] [address], [place], [place] [address]    Culture STAPHYLOCOCCUS AUREUS (A)  Final   Report Status 04/19/2021 FINAL  Final   Organism ID, Bacteria STAPHYLOCOCCUS AUREUS  Final      Susceptibility   Staphylococcus aureus - MIC*    CIPROFLOXACIN <=0.5 SENSITIVE Sensitive     ERYTHROMYCIN <=0.25 SENSITIVE Sensitive     GENTAMICIN <=0.5 SENSITIVE Sensitive     OXACILLIN <=0.25 SENSITIVE Sensitive     TETRACYCLINE <=1 SENSITIVE Sensitive     VANCOMYCIN <=0.5 SENSITIVE Sensitive     TRIMETH/SULFA <=10 SENSITIVE Sensitive     CLINDAMYCIN <=0.25 SENSITIVE Sensitive     RIFAMPIN <=0.5 SENSITIVE Sensitive     Inducible Clindamycin NEGATIVE Sensitive     * STAPHYLOCOCCUS AUREUS  Culture, blood (Routine X 2) w Reflex to ID Panel     Status: Abnormal   Collection Time: 04/16/21  9:42 PM   Specimen: BLOOD  Result Value Ref Range Status   Specimen Description BLOOD BLOOD RIGHT ARM  Final   Special Requests   Final    BOTTLES DRAWN AEROBIC AND ANAEROBIC Blood Culture results may not be optimal due to an inadequate volume of blood received in culture bottles   Culture  Setup Time   Final    GRAM POSITIVE COCCI IN BOTH AEROBIC AND ANAEROBIC BOTTLES CRITICAL VALUE NOTED.  VALUE IS CONSISTENT WITH PREVIOUSLY REPORTED AND CALLED VALUE.    Culture (A)  Final    STAPHYLOCOCCUS AUREUS SUSCEPTIBILITIES PERFORMED ON PREVIOUS CULTURE WITHIN THE LAST 5 DAYS. Performed at [facility], [place] [address], [place], [place] [address]    Report Status 04/19/2021 FINAL  Final  Blood Culture ID Panel (Reflexed)     Status: Abnormal   Collection Time: 04/16/21  9:42 PM  Result Value Ref Range Status   Enterococcus faecalis NOT DETECTED NOT DETECTED  Final   Enterococcus Faecium NOT DETECTED NOT DETECTED Final   Listeria monocytogenes NOT DETECTED NOT DETECTED Final   Staphylococcus species DETECTED (A) NOT DETECTED Final    Comment: CRITICAL RESULT CALLED TO, READ BACK BY AND VERIFIED WITH: PHARMD A.LOWLES AT 1121 ON  04/17/2021 BY T.SAAD.    Staphylococcus aureus (BCID) DETECTED (A) NOT DETECTED Final    Comment: CRITICAL RESULT CALLED TO, READ BACK BY AND VERIFIED WITH: PHARMD A.LOWLES AT 1121 ON 04/17/2021 BY T.SAAD.    Staphylococcus epidermidis NOT DETECTED NOT DETECTED Final   Staphylococcus lugdunensis NOT DETECTED NOT DETECTED Final   Streptococcus species NOT DETECTED NOT DETECTED Final   Streptococcus agalactiae NOT DETECTED NOT DETECTED Final   Streptococcus pneumoniae NOT DETECTED NOT DETECTED Final   Streptococcus pyogenes NOT DETECTED NOT DETECTED Final   A.calcoaceticus-baumannii NOT DETECTED NOT DETECTED Final   Bacteroides fragilis NOT DETECTED NOT DETECTED Final   Enterobacterales NOT DETECTED NOT DETECTED Final   Enterobacter cloacae complex NOT DETECTED NOT DETECTED Final   Escherichia coli NOT DETECTED NOT DETECTED Final   Klebsiella aerogenes NOT DETECTED NOT DETECTED Final   Klebsiella oxytoca NOT DETECTED NOT DETECTED Final   Klebsiella pneumoniae NOT DETECTED NOT DETECTED Final   Proteus species NOT DETECTED NOT DETECTED Final   Salmonella species NOT DETECTED NOT DETECTED Final   Serratia marcescens NOT DETECTED NOT DETECTED Final   Haemophilus influenzae NOT DETECTED NOT DETECTED Final   Neisseria meningitidis NOT DETECTED NOT DETECTED Final   Pseudomonas aeruginosa NOT DETECTED NOT DETECTED Final   Stenotrophomonas maltophilia NOT DETECTED NOT DETECTED Final   Candida albicans NOT DETECTED NOT DETECTED Final   Candida auris NOT DETECTED NOT DETECTED Final   Candida glabrata NOT DETECTED NOT DETECTED Final   Candida krusei NOT DETECTED NOT DETECTED Final   Candida parapsilosis NOT DETECTED NOT  DETECTED Final   Candida tropicalis NOT DETECTED NOT DETECTED Final   Cryptococcus neoformans/gattii NOT DETECTED NOT DETECTED Final   Meth resistant mecA/C and MREJ NOT DETECTED NOT DETECTED Final    Comment: Performed at [facility], [address], [place], [place] [address]  Culture, blood (Routine X 2) w Reflex to ID Panel     Status: None   Collection Time: 04/19/21 11:32 AM   Specimen: BLOOD RIGHT HAND  Result Value Ref Range Status   Specimen Description BLOOD RIGHT HAND  Final   Special Requests   Final    BOTTLES DRAWN AEROBIC AND ANAEROBIC Blood Culture adequate volume   Culture   Final    NO GROWTH 5 DAYS Performed at [facility], [place] [address] [place], [place] [address]    Report Status 04/24/2021 FINAL  Final  Culture, blood (Routine X 2) w Reflex to ID Panel     Status: None   Collection Time: 04/19/21 11:37 AM   Specimen: BLOOD LEFT HAND  Result Value Ref Range Status   Specimen Description BLOOD LEFT HAND  Final   Special Requests   Final    BOTTLES DRAWN AEROBIC AND ANAEROBIC Blood Culture adequate volume   Culture   Final    NO GROWTH 5 DAYS Performed at [facility], [place] [address], [place], [place] [address]    Report Status 04/24/2021 FINAL  Final     Terri Piedra, NP [facility]  04/24/2021  10:45 AM

## 2021-04-24 NOTE — Progress Notes (Signed)
Currently PROGRESS NOTE    Phillip Young  [id number] DOB: 12-02-1985 DOA: 04/12/2021 PCP: [name], MD    Brief Narrative:  35 year old gentleman with severe biventricular systolic heart failure with known ejection fraction less than 20%, type 2 diabetes, paroxysmal A-flutter on Eliquis and  recently started on hemodialysis and recent extensive hospitalization with cardiogenic shock and cardiorenal syndrome progressed to ESRD presents back to the emergency room with shortness of breath, left wrist and right knee hurting.  Unable to ambulate around.  Home oxygen concentrator not working. At the emergency room saturations improved and back on 4 L oxygen.  Chest x-ray with pulmonary edema mostly chronic.  COVID-19 and flu negative.  Unable to ambulate.  Continues to complain of pain.  He was found to have MSSA bacteremia.  ID and nephrology consulted. After catheter holiday, he received on the permacath on 9/28 and restarted on hemodialysis.   Assessment & Plan:   Principal Problem:   MSSA bacteremia Active Problems:   Morbid obesity ([place])   GERD without esophagitis   OSA (obstructive sleep apnea)   Chronic systolic CHF (congestive heart failure) ([name])   Paroxysmal atrial flutter ([name])   Hyponatremia   ESRD (end stage renal disease) on dialysis ([place])   Acute on chronic respiratory failure with hypoxia ([name])   Medical non-compliance   Hypotension  MSSA bacteremia: Arthralgias.  Possible septic arthritis but unable to prove.  CT scan without any evidence of abscess or collection. 4 out of 4 bottles positive for MSSA on admission Permacath removed, repeat blood cultures negative so far.  ID consulted.  Currently remains on cefazolin. Patient does have diffuse arthralgia, no evidence of palpable abscess, however difficult. TTE did not show any vegetation.  Too difficult to do TEE with current oxygen requirement.  He may end up on a ventilator. TEE was rescheduled for 9/29,  postponed due to fluid overload and being on 8 L oxygen.  Scheduled for 9/3. Difficult to do physical exam on his joints, however no obvious evidence of swelling or effusion. CT hip left normal. Pain is controlled and slightly improved today, on oral oxycodone and Flexeril along with short course of prednisone.  Acute on chronic respiratory failure with hypoxemia and hypercarbia: Due to malfunctioning concentrator at home.  Currently on 8 L oxygen and saturating adequate.  Keep on oxygen to keep saturation more than 90%.  Use BiPAP at night without interruption because of underlying obesity hypoventilation, sleep apnea and heart failure.  Stabilized.  ESRD and hemodialysis: Received new permacath.  Received dialysis.  Biventricular heart failure/paroxysmal atrial fibrillation/hypotension: Stable.  Known EF of less than 20%.  Followed by heart failure team.  Intolerance to advanced heart failure therapies. Midodrine 20 mg 3 times a day.  Fluid management with dialysis.  Not a candidate for ICD as per heart failure team. Currently in sinus rhythm on amiodarone.  Therapeutic on Eliquis.  Morbid obesity: BMI more than 50.  Started on semaglutide.  Ambulatory dysfunction: Work with PT OT.  Not medically stable for discharge.  Ultimate plans to go home with home health PT OT. Hopefully, by Monday we can control his pain as well as can get his TEE done and he can go home with antibiotics along with hemodialysis.    DVT prophylaxis:  apixaban (ELIQUIS) tablet 5 mg   Code Status: Full code Family Communication: None Disposition Plan: Status is: Inpatient.   Dispo: The patient is from: Home  Anticipated d/c is to: SNF versus home with home health.              Patient currently is not medically stable.   Difficult to place patient No         Consultants:  Cardiology Nephrology Infectious disease Palliative care  Procedures:  None  Antimicrobials:   Cefazolin   Subjective: Patient seen and examined.  He came back from dialysis.  Left hip hurts.  He has some dissatisfaction with his nurse today.  He is asking me to find a new nurse.  No new events.  Tolerated dialysis well.  Objective: Vitals:   04/24/21 1100 04/24/21 1105 04/24/21 1106 04/24/21 1121  BP: (!) 92/55 (!) 94/48 (!) 94/57   Pulse: 74 78 76   Resp: '16 17 18 20  '$ Temp:   98 F (36.7 C)   TempSrc:   Oral   SpO2:   99%   Weight:   (!) 183.7 kg   Height:        Intake/Output Summary (Last 24 hours) at 04/24/2021 1250 Last data filed at 04/24/2021 1106 Gross per 24 hour  Intake 1451.47 ml  Output 6149 ml  Net -4697.53 ml   Filed Weights   04/23/21 0310 04/24/21 0704 04/24/21 1106  Weight: (!) 192.2 kg (!) 190 kg (!) 183.7 kg    Examination:  General: Chronically sick looking.  Frail and debilitated.  Morbidly obese gentleman on 6-8 L of oxygen.  Able to talk in complete sentences.  In mild distress with mobility otherwise not in distress at rest. Cardiovascular: Regular rate rhythm.  Difficult to auscultate. Respiratory: No added sounds.  Difficult to auscultate. Gastrointestinal: Soft and nontender.  Obese and pendulous.  Bowel sounds present. Ext: Chronic edematous legs.  Nonpitting edema. Left wrist joint, right knee without tenderness or effusion.  Left hip with no obvious palpable tenderness.   Data Reviewed: I have personally reviewed following labs and imaging studies  CBC: Recent Labs  Lab 04/18/21 0030 04/19/21 0048 04/20/21 0051 04/22/21 0641 04/24/21 0726  WBC 19.9* 16.2* 14.4* 14.7* 11.3*  NEUTROABS 17.1* 13.0* 11.8*  --   --   HGB 8.9* 9.0* 10.3* 9.0* 8.7*  HCT 28.2* 28.9* 32.2* 28.8* 28.3*  MCV 100.7* 99.3 98.2 96.6 101.1*  PLT 351 343 341 374 [id number]*   Basic Metabolic Panel: Recent Labs  Lab 04/18/21 0030 04/19/21 0048 04/20/21 0051 04/22/21 0043 04/22/21 1040 04/23/21 0648 04/24/21 0726  NA 128* 130* 129* 128*  --  132* 132*   K 6.2* 4.7 4.7 6.6* 6.2* 4.9 5.0  CL 90* 90* 91* 91*  --  93* 92*  CO2 '25 24 22 24  '$ --  26 27  GLUCOSE 126* 112* 107* 165*  --  103* 139*  BUN 61* 52* 72* 93*  --  61* 80*  CREATININE 5.72* 5.17* 5.58* 5.06*  --  3.15* 3.37*  CALCIUM 9.9 9.5 9.8 10.0  --  9.8 9.7  PHOS 8.4* 6.8* 8.1* 9.3*  --   --  6.0*   GFR: Estimated Creatinine Clearance: 50.9 mL/min (A) (by C-G formula based on SCr of 3.37 mg/dL (H)). Liver Function Tests: Recent Labs  Lab 04/18/21 0030 04/19/21 0048 04/20/21 0051 04/22/21 0043 04/24/21 0726  ALBUMIN 2.8* 2.6* 2.6* 2.6* 2.6*   No results for input(s): LIPASE, AMYLASE in the last 168 hours.  No results for input(s): AMMONIA in the last 168 hours. Coagulation Profile: Recent Labs  Lab 04/23/21 0648  INR 1.3*  Cardiac Enzymes: No results for input(s): CKTOTAL, CKMB, CKMBINDEX, TROPONINI in the last 168 hours. BNP (last 3 results) No results for input(s): PROBNP in the last 8760 hours. HbA1C: No results for input(s): HGBA1C in the last 72 hours. CBG: No results for input(s): GLUCAP in the last 168 hours. Lipid Profile: No results for input(s): CHOL, HDL, LDLCALC, TRIG, CHOLHDL, LDLDIRECT in the last 72 hours. Thyroid Function Tests: No results for input(s): TSH, T4TOTAL, FREET4, T3FREE, THYROIDAB in the last 72 hours. Anemia Panel: No results for input(s): VITAMINB12, FOLATE, FERRITIN, TIBC, IRON, RETICCTPCT in the last 72 hours. Sepsis Labs: No results for input(s): PROCALCITON, LATICACIDVEN in the last 168 hours.  Recent Results (from the past 240 hour(s))  Culture, blood (Routine X 2) w Reflex to ID Panel     Status: Abnormal   Collection Time: 04/16/21  9:42 PM   Specimen: BLOOD  Result Value Ref Range Status   Specimen Description BLOOD BLOOD LEFT ARM  Final   Special Requests   Final    BOTTLES DRAWN AEROBIC AND ANAEROBIC Blood Culture adequate volume   Culture  Setup Time   Final    GRAM POSITIVE COCCI IN BOTH AEROBIC AND ANAEROBIC  BOTTLES CRITICAL RESULT CALLED TO, READ BACK BY AND VERIFIED WITH: PHARMD A.LOWLES AT [id number] ON 04/17/2021 BY T.SAAD. Performed at [facility], [place] [address], [place], [place] [address]    Culture STAPHYLOCOCCUS AUREUS (A)  Final   Report Status 04/19/2021 FINAL  Final   Organism ID, Bacteria STAPHYLOCOCCUS AUREUS  Final      Susceptibility   Staphylococcus aureus - MIC*    CIPROFLOXACIN <=0.5 SENSITIVE Sensitive     ERYTHROMYCIN <=0.25 SENSITIVE Sensitive     GENTAMICIN <=0.5 SENSITIVE Sensitive     OXACILLIN <=0.25 SENSITIVE Sensitive     TETRACYCLINE <=1 SENSITIVE Sensitive     VANCOMYCIN <=0.5 SENSITIVE Sensitive     TRIMETH/SULFA <=10 SENSITIVE Sensitive     CLINDAMYCIN <=0.25 SENSITIVE Sensitive     RIFAMPIN <=0.5 SENSITIVE Sensitive     Inducible Clindamycin NEGATIVE Sensitive     * STAPHYLOCOCCUS AUREUS  Culture, blood (Routine X 2) w Reflex to ID Panel     Status: Abnormal   Collection Time: 04/16/21  9:42 PM   Specimen: BLOOD  Result Value Ref Range Status   Specimen Description BLOOD BLOOD RIGHT ARM  Final   Special Requests   Final    BOTTLES DRAWN AEROBIC AND ANAEROBIC Blood Culture results may not be optimal due to an inadequate volume of blood received in culture bottles   Culture  Setup Time   Final    GRAM POSITIVE COCCI IN BOTH AEROBIC AND ANAEROBIC BOTTLES CRITICAL VALUE NOTED.  VALUE IS CONSISTENT WITH PREVIOUSLY REPORTED AND CALLED VALUE.    Culture (A)  Final    STAPHYLOCOCCUS AUREUS SUSCEPTIBILITIES PERFORMED ON PREVIOUS CULTURE WITHIN THE LAST 5 DAYS. Performed at [facility], [place] [address] [place], [place] [address]    Report Status 04/19/2021 FINAL  Final  Blood Culture ID Panel (Reflexed)     Status: Abnormal   Collection Time: 04/16/21  9:42 PM  Result Value Ref Range Status   Enterococcus faecalis NOT DETECTED NOT DETECTED Final   Enterococcus Faecium NOT DETECTED NOT DETECTED Final   Listeria monocytogenes NOT DETECTED NOT  DETECTED Final   Staphylococcus species DETECTED (A) NOT DETECTED Final    Comment: CRITICAL RESULT CALLED TO, READ BACK BY AND VERIFIED WITH: PHARMD A.LOWLES AT  1121 ON 04/17/2021 BY T.SAAD.    Staphylococcus aureus (BCID) DETECTED (A) NOT DETECTED Final    Comment: CRITICAL RESULT CALLED TO, READ BACK BY AND VERIFIED WITH: PHARMD A.LOWLES AT 1121 ON 04/17/2021 BY T.SAAD.    Staphylococcus epidermidis NOT DETECTED NOT DETECTED Final   Staphylococcus lugdunensis NOT DETECTED NOT DETECTED Final   Streptococcus species NOT DETECTED NOT DETECTED Final   Streptococcus agalactiae NOT DETECTED NOT DETECTED Final   Streptococcus pneumoniae NOT DETECTED NOT DETECTED Final   Streptococcus pyogenes NOT DETECTED NOT DETECTED Final   A.calcoaceticus-baumannii NOT DETECTED NOT DETECTED Final   Bacteroides fragilis NOT DETECTED NOT DETECTED Final   Enterobacterales NOT DETECTED NOT DETECTED Final   Enterobacter cloacae complex NOT DETECTED NOT DETECTED Final   Escherichia coli NOT DETECTED NOT DETECTED Final   Klebsiella aerogenes NOT DETECTED NOT DETECTED Final   Klebsiella oxytoca NOT DETECTED NOT DETECTED Final   Klebsiella pneumoniae NOT DETECTED NOT DETECTED Final   Proteus species NOT DETECTED NOT DETECTED Final   Salmonella species NOT DETECTED NOT DETECTED Final   Serratia marcescens NOT DETECTED NOT DETECTED Final   Haemophilus influenzae NOT DETECTED NOT DETECTED Final   Neisseria meningitidis NOT DETECTED NOT DETECTED Final   Pseudomonas aeruginosa NOT DETECTED NOT DETECTED Final   Stenotrophomonas maltophilia NOT DETECTED NOT DETECTED Final   Candida albicans NOT DETECTED NOT DETECTED Final   Candida auris NOT DETECTED NOT DETECTED Final   Candida glabrata NOT DETECTED NOT DETECTED Final   Candida krusei NOT DETECTED NOT DETECTED Final   Candida parapsilosis NOT DETECTED NOT DETECTED Final   Candida tropicalis NOT DETECTED NOT DETECTED Final   Cryptococcus neoformans/gattii NOT  DETECTED NOT DETECTED Final   Meth resistant mecA/C and MREJ NOT DETECTED NOT DETECTED Final    Comment: Performed at [name], [address], [place], [place] [address]  Culture, blood (Routine X 2) w Reflex to ID Panel     Status: None   Collection Time: 04/19/21 11:32 AM   Specimen: BLOOD RIGHT HAND  Result Value Ref Range Status   Specimen Description BLOOD RIGHT HAND  Final   Special Requests   Final    BOTTLES DRAWN AEROBIC AND ANAEROBIC Blood Culture adequate volume   Culture   Final    NO GROWTH 5 DAYS Performed at [facility], [place] [address], [place], [place] [address]    Report Status 04/24/2021 FINAL  Final  Culture, blood (Routine X 2) w Reflex to ID Panel     Status: None   Collection Time: 04/19/21 11:37 AM   Specimen: BLOOD LEFT HAND  Result Value Ref Range Status   Specimen Description BLOOD LEFT HAND  Final   Special Requests   Final    BOTTLES DRAWN AEROBIC AND ANAEROBIC Blood Culture adequate volume   Culture   Final    NO GROWTH 5 DAYS Performed at [facility], [place] [address], [place], [place] [address]    Report Status 04/24/2021 FINAL  Final         Radiology Studies: CT KNEE RIGHT WO CONTRAST  Result Date: 04/22/2021 CLINICAL DATA:  Right knee osteomyelitis suspected. Right knee pain. EXAM: CT OF THE RIGHT KNEE WITHOUT CONTRAST TECHNIQUE: Multidetector CT imaging of the RIGHT knee was performed according to the standard protocol. Multiplanar CT image reconstructions were also generated. COMPARISON:  None. FINDINGS: Bones/Joint/Cartilage No fracture or dislocation. Normal alignment. Moderate joint effusion. Osteochondral lesion versus erosion involving the trochlear groove. No  other areas of bone destruction or erosive changes. No aggressive osseous lesion. Ligaments Ligaments are suboptimally evaluated by CT. Muscles and Tendons Muscles are normal. Patellar tendon and quadriceps tendon are intact. Soft tissue No fluid collection or  hematoma.  No soft tissue mass. IMPRESSION: 1. Osteochondral lesion versus erosion involving the trochlear groove. Erosion may be secondary to an inflammatory or crystalline arthropathy versus less likely infectious etiology. If there is further clinical concern, recommend an MRI of the right knee without and with intravenous contrast. 2. Moderate joint effusion. Electronically Signed   By: Kathreen Devoid M.D.   On: 04/22/2021 18:45   IR Fluoro Guide CV Line Right  Addendum Date: 04/23/2021   ADDENDUM REPORT: 04/23/2021 11:06 ADDENDUM: Correction: Sodium citrate was placed within the catheter lumens NOT heparin. Electronically Signed   By: Markus Daft M.D.   On: 04/23/2021 11:06   Result Date: 04/23/2021 INDICATION: 35 year old with end-stage renal disease and needs new dialysis access. Patient had a catheter holiday due to infection. EXAM: FLUOROSCOPIC AND ULTRASOUND GUIDED PLACEMENT OF A TUNNELED DIALYSIS CATHETER Physician: Stephan Minister. Anselm Pancoast, MD MEDICATIONS: Inpatient and receiving antibiotics. ANESTHESIA/SEDATION: Local anesthetic, 1% lidocaine FLUOROSCOPY TIME:  Fluoroscopy Time: 3 minutes 30 seconds (133 mGy). COMPLICATIONS: None immediate. PROCEDURE: The procedure was explained to the patient. The risks and benefits of the procedure were discussed and the patient's questions were addressed. Informed consent was obtained from the patient. The patient was placed supine on the interventional table. Ultrasound confirmed a patent right internal jugular vein. Ultrasound image obtained for documentation. The right neck and chest was prepped and draped in a sterile fashion. Maximal barrier sterile technique was utilized including caps, mask, sterile gowns, sterile gloves, sterile drape, hand hygiene and skin antiseptic. The right neck was anesthetized with 1% lidocaine. A small incision was made with #11 blade scalpel. A 21 gauge needle directed into the right internal jugular vein with ultrasound guidance. A  micropuncture dilator set was placed. A 23 cm tip to cuff Palindrome catheter was selected. The skin below the right clavicle was anesthetized and a small incision was made with an #11 blade scalpel. A subcutaneous tunnel was formed to the vein dermatotomy site. The catheter was brought through the tunnel. The vein dermatotomy site was dilated to accommodate a peel-away sheath. The catheter was placed through the peel-away sheath and directed into the central venous structures. The tip of the catheter was placed at superior cavoatrial junction with fluoroscopy. Fluoroscopic images were obtained for documentation. Both lumens were found to aspirate and flush well. The proper amount of heparin was flushed in both lumens. The vein dermatotomy site was closed using a single layer of absorbable suture and Dermabond. The catheter was secured to the skin using Prolene suture. IMPRESSION: Successful placement of a right jugular tunneled dialysis catheter using ultrasound and fluoroscopic guidance. Electronically Signed: By: Markus Daft M.D. On: 04/23/2021 08:08   IR US Guide Vasc Access Right  Addendum Date: 04/23/2021   ADDENDUM REPORT: 04/23/2021 11:06 ADDENDUM: Correction: Sodium citrate was placed within the catheter lumens NOT heparin. Electronically Signed   By: Markus Daft M.D.   On: 04/23/2021 11:06   Result Date: 04/23/2021 INDICATION: 35 year old with end-stage renal disease and needs new dialysis access. Patient had a catheter holiday due to infection. EXAM: FLUOROSCOPIC AND ULTRASOUND GUIDED PLACEMENT OF A TUNNELED DIALYSIS CATHETER Physician: Stephan Minister. Anselm Pancoast, MD MEDICATIONS: Inpatient and receiving antibiotics. ANESTHESIA/SEDATION: Local anesthetic, 1% lidocaine FLUOROSCOPY TIME:  Fluoroscopy Time: 3 minutes 30 seconds (  133 mGy). COMPLICATIONS: None immediate. PROCEDURE: The procedure was explained to the patient. The risks and benefits of the procedure were discussed and the patient's questions were  addressed. Informed consent was obtained from the patient. The patient was placed supine on the interventional table. Ultrasound confirmed a patent right internal jugular vein. Ultrasound image obtained for documentation. The right neck and chest was prepped and draped in a sterile fashion. Maximal barrier sterile technique was utilized including caps, mask, sterile gowns, sterile gloves, sterile drape, hand hygiene and skin antiseptic. The right neck was anesthetized with 1% lidocaine. A small incision was made with #11 blade scalpel. A 21 gauge needle directed into the right internal jugular vein with ultrasound guidance. A micropuncture dilator set was placed. A 23 cm tip to cuff Palindrome catheter was selected. The skin below the right clavicle was anesthetized and a small incision was made with an #11 blade scalpel. A subcutaneous tunnel was formed to the vein dermatotomy site. The catheter was brought through the tunnel. The vein dermatotomy site was dilated to accommodate a peel-away sheath. The catheter was placed through the peel-away sheath and directed into the central venous structures. The tip of the catheter was placed at superior cavoatrial junction with fluoroscopy. Fluoroscopic images were obtained for documentation. Both lumens were found to aspirate and flush well. The proper amount of heparin was flushed in both lumens. The vein dermatotomy site was closed using a single layer of absorbable suture and Dermabond. The catheter was secured to the skin using Prolene suture. IMPRESSION: Successful placement of a right jugular tunneled dialysis catheter using ultrasound and fluoroscopic guidance. Electronically Signed: By: Markus Daft M.D. On: 04/23/2021 08:08        Scheduled Meds:  allopurinol  100 mg Oral Daily   amiodarone  200 mg Oral Daily   apixaban  5 mg Oral BID   Chlorhexidine Gluconate Cloth  6 each Topical Daily   Chlorhexidine Gluconate Cloth  6 each Topical Q0600   darbepoetin  (ARANESP) injection - DIALYSIS  150 mcg Intravenous Q Fri-HD   lanthanum  500 mg Oral TID WC   linaclotide  145 mcg Oral QAC breakfast   midodrine  20 mg Oral TID WC   predniSONE  40 mg Oral Q breakfast   Continuous Infusions:  sodium chloride Stopped (04/23/21 0830)   anticoagulant sodium citrate     anticoagulant sodium citrate      ceFAZolin (ANCEF) IV 1 g (04/23/21 1134)     LOS: 8 days    Time spent: 30 minutes    Barb Merino, MD [facility] Pager [phone]

## 2021-04-24 NOTE — Procedures (Signed)
Patient seen on Hemodialysis. BP 103/61 (BP Location: Right Wrist)   Pulse 81   Temp 98 F (36.7 C) (Oral)   Resp 19   Ht '5\' 11"'$  (1.803 m)   Wt (!) 190 kg   SpO2 99%   BMI 58.42 kg/m   QB 350, UF goal 6L Tolerating treatment without complaints at this time.   Elmarie Shiley MD [facility] Office # [phone] Pager # [phone] 9:38 AM

## 2021-04-24 NOTE — Progress Notes (Signed)
Physical Therapy Treatment Patient Details Name: Phillip Young MRN: [id number] DOB: Oct 03, 1985 Today's Date: 04/24/2021   History of Present Illness Pt is a 35 y.o. male admitted 04/12/21 with SOB, hypoxia. Workup for MSSA bacteremia, acute hypoxic respiratory failure, cardiorenal syndrome. Pt had catheter holiday with clearance of bacteremia; pt declined reinsertion of HD catheter 9/27. Course complicated by R knee and L hip pain; pelvic CT negative for acute abnormality. Of note, recent admission 02/11/21-03/26/21 with cardiogenic shock, biventricular HF, progression to ESRD with need for HD. Other PMH includes CHF, DM2, aflutter, ESRD (HD TTS).    PT Comments    A lengthy session still limited by significant gouty pain and need for significant assist of 2 persons that were not available today.  Emphasis on transitions to EOB, sit to stands into an EVA walker.  Attempt to ambulate limited to 3 feet forward and back with significant struggle and only possible due to use of the EVA walker.    Recommendations for follow up therapy are one component of a multi-disciplinary discharge planning process, led by the attending physician.  Recommendations may be updated based on patient status, additional functional criteria and insurance authorization.  Follow Up Recommendations  SNF;Supervision for mobility/OOB;Other (comment) (Will work toward home as able)     Equipment Recommendations  Other (comment) (TBD)    Recommendations for Other Services       Precautions / Restrictions Precautions Precautions: Fall Precaution Comments: Watch SpO2 (wears 1L O2 baseline)     Mobility  Bed Mobility Overal bed mobility: Needs Assistance Bed Mobility: Supine to Sit     Supine to sit: Max assist     General bed mobility comments: Very effortful and painful.  Had to assist by pt pullin on therapist assist    Transfers Overall transfer level: Needs assistance Equipment used:  (EVA  walker) Transfers: Sit to/from Stand Sit to Stand: Max assist;From elevated surface         General transfer comment: Pt needed a significant amount of forward translation and boost.  Ambulation/Gait Ambulation/Gait assistance: Min assist Gait Distance (Feet): 3 Feet (forward and back on the EVA walker) Assistive device:  (eva) Gait Pattern/deviations: Step-to pattern   Gait velocity interpretation: <1.31 ft/sec, indicative of household ambulator General Gait Details: pt took several visibly painful steps forward and back with significant weight bore through the EVA walker.   Stairs             Wheelchair Mobility    Modified Rankin (Stroke Patients Only)       Balance     Sitting balance-Leahy Scale: Fair Sitting balance - Comments: due to pain sitting without UE assist more difficult   Standing balance support: Bilateral upper extremity supported Standing balance-Leahy Scale: Poor                              Cognition Arousal/Alertness: Awake/alert Behavior During Therapy: Restless;Agitated;Anxious Overall Cognitive Status: Within Functional Limits for tasks assessed                                        Exercises Other Exercises Other Exercises: gentle AA/PROM to LE's prior to mobility    General Comments        Pertinent Vitals/Pain Pain Assessment: Faces Faces Pain Scale: Hurts whole lot Pain Location: left hip, right knee Pain Descriptors /  Indicators: Grimacing;Moaning;Guarding;Constant;Discomfort;Sore Pain Intervention(s): Limited activity within patient's tolerance;Patient requesting pain meds-RN notified    Home Living                      Prior Function            PT Goals (current goals can now be found in the care plan section) Acute Rehab PT Goals Patient Stated Goal: to reduce pain and return to independent mobility PT Goal Formulation: With patient Time For Goal Achievement:  04/29/21 Potential to Achieve Goals: Fair Progress towards PT goals: Progressing toward goals    Frequency    Min 2X/week      PT Plan Current plan remains appropriate    Co-evaluation              AM-PAC PT "6 Clicks" Mobility   Outcome Measure  Help needed turning from your back to your side while in a flat bed without using bedrails?: A Lot Help needed moving from lying on your back to sitting on the side of a flat bed without using bedrails?: A Lot Help needed moving to and from a bed to a chair (including a wheelchair)?: Total Help needed standing up from a chair using your arms (e.g., wheelchair or bedside chair)?: Total Help needed to walk in hospital room?: Total Help needed climbing 3-5 steps with a railing? : Total 6 Click Score: 8    End of Session   Activity Tolerance: Patient limited by pain Patient left: in bed;with call bell/phone within reach;with bed alarm set (sitting EOB for lunch) Nurse Communication: Mobility status PT Visit Diagnosis: Difficulty in walking, not elsewhere classified (R26.2);Other abnormalities of gait and mobility (R26.89);Pain Pain - Right/Left: Left Pain - part of body: Hip;Knee     Time: [id number] PT Time Calculation (min) (ACUTE ONLY): 41 min  Charges:  $Gait Training: 8-22 mins $Therapeutic Activity: 23-37 mins                     04/24/2021  Ginger Carne., PT Acute Rehabilitation Services [phone]  (pager) [phone]  (office)   Tessie Fass Cylee Dattilo 04/24/2021, 4:42 PM

## 2021-04-24 NOTE — Plan of Care (Signed)
  Problem: Education: Goal: Knowledge of General Education information will improve Description: Including pain rating scale, medication(s)/side effects and non-pharmacologic comfort measures Outcome: Progressing   Problem: Health Behavior/Discharge Planning: Goal: Ability to manage health-related needs will improve Outcome: Progressing   Problem: Clinical Measurements: Goal: Will remain free from infection Outcome: Progressing Goal: Diagnostic test results will improve Outcome: Progressing Goal: Respiratory complications will improve Outcome: Progressing   Problem: Nutrition: Goal: Adequate nutrition will be maintained Outcome: Progressing   Problem: Pain Managment: Goal: General experience of comfort will improve Outcome: Progressing   Problem: Skin Integrity: Goal: Risk for impaired skin integrity will decrease Outcome: Progressing   

## 2021-04-25 DIAGNOSIS — J9611 Chronic respiratory failure with hypoxia: Secondary | ICD-10-CM | POA: Diagnosis not present

## 2021-04-25 LAB — CBC
HCT: 31.5 % — ABNORMAL LOW (ref 39.0–52.0)
Hemoglobin: 9.7 g/dL — ABNORMAL LOW (ref 13.0–17.0)
MCH: 30.3 pg (ref 26.0–34.0)
MCHC: 30.8 g/dL (ref 30.0–36.0)
MCV: 98.4 fL (ref 80.0–100.0)
Platelets: 447 10*3/uL — ABNORMAL HIGH (ref 150–400)
RBC: 3.2 MIL/uL — ABNORMAL LOW (ref 4.22–5.81)
RDW: 18.5 % — ABNORMAL HIGH (ref 11.5–15.5)
WBC: 10.6 10*3/uL — ABNORMAL HIGH (ref 4.0–10.5)
nRBC: 0.2 % (ref 0.0–0.2)

## 2021-04-25 LAB — RENAL FUNCTION PANEL
Albumin: 2.8 g/dL — ABNORMAL LOW (ref 3.5–5.0)
Anion gap: 11 (ref 5–15)
BUN: 55 mg/dL — ABNORMAL HIGH (ref 6–20)
CO2: 26 mmol/L (ref 22–32)
Calcium: 9.9 mg/dL (ref 8.9–10.3)
Chloride: 92 mmol/L — ABNORMAL LOW (ref 98–111)
Creatinine, Ser: 3.47 mg/dL — ABNORMAL HIGH (ref 0.61–1.24)
GFR, Estimated: 22 mL/min — ABNORMAL LOW (ref >60)
Glucose, Bld: 99 mg/dL (ref 70–99)
Phosphorus: 6.8 mg/dL — ABNORMAL HIGH (ref 2.5–4.6)
Potassium: 5.5 mmol/L — ABNORMAL HIGH (ref 3.5–5.1)
Sodium: 129 mmol/L — ABNORMAL LOW (ref 135–145)

## 2021-04-25 MED ORDER — POLYETHYLENE GLYCOL 3350 17 G PO PACK
17.0000 g | PACK | Freq: Once | ORAL | Status: DC
  Filled 2021-04-25: qty 1

## 2021-04-25 MED ORDER — BISACODYL 10 MG RE SUPP
10.0000 mg | Freq: Once | RECTAL | Status: DC
  Filled 2021-04-25: qty 1

## 2021-04-25 NOTE — Progress Notes (Signed)
BP recheck result. UF remains off.

## 2021-04-25 NOTE — Procedures (Signed)
Patient seen on Hemodialysis. BP 120/62 (BP Location: Right Wrist)   Pulse 75   Temp 98.4 F (36.9 C) (Oral)   Resp 18   Ht '5\' 11"'$  (1.803 m)   Wt (!) 184.9 kg   SpO2 95%   BMI 56.85 kg/m   QB 400, UF goal 3L Tolerating treatment without complaints at this time.   Elmarie Shiley MD [facility]. Office # [phone] Pager # [phone] 9:27 AM

## 2021-04-25 NOTE — Progress Notes (Signed)
Currently PROGRESS NOTE    Phillip Young  [id number] DOB: 1985-09-24 DOA: 04/12/2021 PCP: [name], MD    Brief Narrative:  35 year old gentleman with severe biventricular systolic heart failure with known ejection fraction less than 20%, type 2 diabetes, paroxysmal A-flutter on Eliquis and  recently started on hemodialysis and recent extensive hospitalization with cardiogenic shock and cardiorenal syndrome progressed to ESRD presents back to the emergency room with shortness of breath, left wrist and right knee hurting.  Unable to ambulate around.  Home oxygen concentrator not working. At the emergency room Chest x-ray with pulmonary edema mostly chronic.  COVID-19 and flu negative.  Unable to ambulate.  Continues to complain of pain.  He was found to have MSSA bacteremia.  ID and nephrology consulted. After catheter holiday, he received on the permacath on 9/28 and restarted on hemodialysis. Continues to complain of pain mostly on the left hip, right knee.   Assessment & Plan:   Principal Problem:   MSSA bacteremia Active Problems:   Morbid obesity ([place])   GERD without esophagitis   OSA (obstructive sleep apnea)   Chronic systolic CHF (congestive heart failure) ([name])   Paroxysmal atrial flutter ([name])   Hyponatremia   ESRD (end stage renal disease) on dialysis ([place])   Acute on chronic respiratory failure with hypoxia ([name])   Medical non-compliance   Hypotension  MSSA bacteremia: Arthralgias.  Possible septic arthritis but unable to prove.   CT left hip 9/25, destructive changes. CT scan right knee 9/28, no acute findings. 4 out of 4 bottles positive for MSSA on admission Permacath removed, repeat blood cultures negative so far.  ID consulted.  Currently remains on cefazolin. Patient does have diffuse arthralgia, unable to prove septic joints.  Will explore if we can do noncontrast MRI as we need to try to avoid gadolinium in this patient. TTE did not show any  vegetation.  Too difficult to do TEE with current oxygen requirement.   TEE was rescheduled for 9/29, postponed due to fluid overload and being on 8 L oxygen.  Scheduled for 9/3. Difficult to do physical exam on his joints, however no obvious evidence of swelling or effusion. On oxycodone, Flexeril.  Was given a short course of prednisone.  Acute on chronic respiratory failure with hypoxemia and hypercarbia: Due to malfunctioning concentrator at home.  Currently on 6-8 L oxygen and saturating adequate.  Keep on oxygen to keep saturation more than 90%.  Use BiPAP at night without interruption because of underlying obesity hypoventilation, sleep apnea and heart failure.  Stabilized.  ESRD and hemodialysis: Received new permacath.  Receiving dialysis today.  Biventricular heart failure/paroxysmal atrial fibrillation/hypotension: Stable.  Known EF of less than 20%.  Followed by heart failure team.  Intolerance to advanced heart failure therapies. Midodrine 20 mg 3 times a day.  Fluid management with dialysis.  Not a candidate for ICD as per heart failure team. Currently in sinus rhythm on amiodarone.  Therapeutic on Eliquis.  Morbid obesity: BMI more than 50.  Started on semaglutide.  Ambulatory dysfunction: Work with PT OT.  Not medically stable for discharge.  Ultimate plans to go home with home health PT OT. Hopefully, by Monday we can control his pain as well as can get his TEE done and he can go home with antibiotics along with hemodialysis.    DVT prophylaxis:  apixaban (ELIQUIS) tablet 5 mg   Code Status: Full code Family Communication: None Disposition Plan: Status is: Inpatient.   Dispo: The patient is  from: Home              Anticipated d/c is to: SNF versus home with home health.              Patient currently is not medically stable.   Difficult to place patient No         Consultants:  Cardiology Nephrology Infectious disease Palliative care  Procedures:   None  Antimicrobials:  Cefazolin   Subjective: Patient seen and examined.  Receiving dialysis.  He tells me that he is right knee pain has come back.  Objective: Vitals:   04/25/21 1037 04/25/21 1038 04/25/21 1042 04/25/21 1050  BP: (!) 78/52  96/60 (!) 103/53  Pulse:      Resp: '18 17 19 17  '$ Temp:      TempSrc:      SpO2:      Weight:      Height:        Intake/Output Summary (Last 24 hours) at 04/25/2021 1108 Last data filed at 04/25/2021 0700 Gross per 24 hour  Intake 702.8 ml  Output 200 ml  Net 502.8 ml   Filed Weights   04/24/21 0704 04/24/21 1106 04/25/21 0853  Weight: (!) 190 kg (!) 183.7 kg (!) 184.9 kg    Examination:  General: Chronically sick looking.  Frail and debilitated.  Morbidly obese gentleman on 6-8 L of oxygen.  Able to talk in complete sentences.  Sleepy with dialysis.  In mild distress with mobility otherwise not in distress at rest. Cardiovascular: Regular rate rhythm.  Difficult to auscultate. Respiratory: No added sounds.  Difficult to auscultate. Gastrointestinal: Soft and nontender.  Obese and pendulous.  Bowel sounds present. Ext: Chronic edematous legs.  Nonpitting edema. Left wrist joint, right knee without tenderness or effusion.  Left hip with no obvious palpable tenderness.   Data Reviewed: I have personally reviewed following labs and imaging studies  CBC: Recent Labs  Lab 04/19/21 0048 04/20/21 0051 04/22/21 0641 04/24/21 0726 04/25/21 0808  WBC 16.2* 14.4* 14.7* 11.3* 10.6*  NEUTROABS 13.0* 11.8*  --   --   --   HGB 9.0* 10.3* 9.0* 8.7* 9.7*  HCT 28.9* 32.2* 28.8* 28.3* 31.5*  MCV 99.3 98.2 96.6 101.1* 98.4  PLT 343 341 374 404* [id number]*   Basic Metabolic Panel: Recent Labs  Lab 04/19/21 0048 04/20/21 0051 04/22/21 0043 04/22/21 1040 04/23/21 0648 04/24/21 0726 04/25/21 0808  NA 130* 129* 128*  --  132* 132* 129*  K 4.7 4.7 6.6* 6.2* 4.9 5.0 5.5*  CL 90* 91* 91*  --  93* 92* 92*  CO2 '24 22 24  '$ --  '26 27 26   '$ GLUCOSE 112* 107* 165*  --  103* 139* 99  BUN 52* 72* 93*  --  61* 80* 55*  CREATININE 5.17* 5.58* 5.06*  --  3.15* 3.37* 3.47*  CALCIUM 9.5 9.8 10.0  --  9.8 9.7 9.9  PHOS 6.8* 8.1* 9.3*  --   --  6.0* 6.8*   GFR: Estimated Creatinine Clearance: 49.6 mL/min (A) (by C-G formula based on SCr of 3.47 mg/dL (H)). Liver Function Tests: Recent Labs  Lab 04/19/21 0048 04/20/21 0051 04/22/21 0043 04/24/21 0726 04/25/21 0808  ALBUMIN 2.6* 2.6* 2.6* 2.6* 2.8*   No results for input(s): LIPASE, AMYLASE in the last 168 hours.  No results for input(s): AMMONIA in the last 168 hours. Coagulation Profile: Recent Labs  Lab 04/23/21 0648  INR 1.3*   Cardiac Enzymes: No results for  input(s): CKTOTAL, CKMB, CKMBINDEX, TROPONINI in the last 168 hours. BNP (last 3 results) No results for input(s): PROBNP in the last 8760 hours. HbA1C: No results for input(s): HGBA1C in the last 72 hours. CBG: No results for input(s): GLUCAP in the last 168 hours. Lipid Profile: No results for input(s): CHOL, HDL, LDLCALC, TRIG, CHOLHDL, LDLDIRECT in the last 72 hours. Thyroid Function Tests: No results for input(s): TSH, T4TOTAL, FREET4, T3FREE, THYROIDAB in the last 72 hours. Anemia Panel: No results for input(s): VITAMINB12, FOLATE, FERRITIN, TIBC, IRON, RETICCTPCT in the last 72 hours. Sepsis Labs: No results for input(s): PROCALCITON, LATICACIDVEN in the last 168 hours.  Recent Results (from the past 240 hour(s))  Culture, blood (Routine X 2) w Reflex to ID Panel     Status: Abnormal   Collection Time: 04/16/21  9:42 PM   Specimen: BLOOD  Result Value Ref Range Status   Specimen Description BLOOD BLOOD LEFT ARM  Final   Special Requests   Final    BOTTLES DRAWN AEROBIC AND ANAEROBIC Blood Culture adequate volume   Culture  Setup Time   Final    GRAM POSITIVE COCCI IN BOTH AEROBIC AND ANAEROBIC BOTTLES CRITICAL RESULT CALLED TO, READ BACK BY AND VERIFIED WITH: PHARMD A.LOWLES AT [id number] ON  04/17/2021 BY T.SAAD. Performed at [facility], [place] [name] [place], [place] [address]    Culture STAPHYLOCOCCUS AUREUS (A)  Final   Report Status 04/19/2021 FINAL  Final   Organism ID, Bacteria STAPHYLOCOCCUS AUREUS  Final      Susceptibility   Staphylococcus aureus - MIC*    CIPROFLOXACIN <=0.5 SENSITIVE Sensitive     ERYTHROMYCIN <=0.25 SENSITIVE Sensitive     GENTAMICIN <=0.5 SENSITIVE Sensitive     OXACILLIN <=0.25 SENSITIVE Sensitive     TETRACYCLINE <=1 SENSITIVE Sensitive     VANCOMYCIN <=0.5 SENSITIVE Sensitive     TRIMETH/SULFA <=10 SENSITIVE Sensitive     CLINDAMYCIN <=0.25 SENSITIVE Sensitive     RIFAMPIN <=0.5 SENSITIVE Sensitive     Inducible Clindamycin NEGATIVE Sensitive     * STAPHYLOCOCCUS AUREUS  Culture, blood (Routine X 2) w Reflex to ID Panel     Status: Abnormal   Collection Time: 04/16/21  9:42 PM   Specimen: BLOOD  Result Value Ref Range Status   Specimen Description BLOOD BLOOD RIGHT ARM  Final   Special Requests   Final    BOTTLES DRAWN AEROBIC AND ANAEROBIC Blood Culture results may not be optimal due to an inadequate volume of blood received in culture bottles   Culture  Setup Time   Final    GRAM POSITIVE COCCI IN BOTH AEROBIC AND ANAEROBIC BOTTLES CRITICAL VALUE NOTED.  VALUE IS CONSISTENT WITH PREVIOUSLY REPORTED AND CALLED VALUE.    Culture (A)  Final    STAPHYLOCOCCUS AUREUS SUSCEPTIBILITIES PERFORMED ON PREVIOUS CULTURE WITHIN THE LAST 5 DAYS. Performed at [facility], [place] [address] [place], [place] [address]    Report Status 04/19/2021 FINAL  Final  Blood Culture ID Panel (Reflexed)     Status: Abnormal   Collection Time: 04/16/21  9:42 PM  Result Value Ref Range Status   Enterococcus faecalis NOT DETECTED NOT DETECTED Final   Enterococcus Faecium NOT DETECTED NOT DETECTED Final   Listeria monocytogenes NOT DETECTED NOT DETECTED Final   Staphylococcus species DETECTED (A) NOT DETECTED Final    Comment: CRITICAL RESULT  CALLED TO, READ BACK BY AND VERIFIED WITH: PHARMD A.LOWLES AT 1121 ON 04/17/2021 BY T.SAAD.  Staphylococcus aureus (BCID) DETECTED (A) NOT DETECTED Final    Comment: CRITICAL RESULT CALLED TO, READ BACK BY AND VERIFIED WITH: PHARMD A.LOWLES AT 1121 ON 04/17/2021 BY T.SAAD.    Staphylococcus epidermidis NOT DETECTED NOT DETECTED Final   Staphylococcus lugdunensis NOT DETECTED NOT DETECTED Final   Streptococcus species NOT DETECTED NOT DETECTED Final   Streptococcus agalactiae NOT DETECTED NOT DETECTED Final   Streptococcus pneumoniae NOT DETECTED NOT DETECTED Final   Streptococcus pyogenes NOT DETECTED NOT DETECTED Final   A.calcoaceticus-baumannii NOT DETECTED NOT DETECTED Final   Bacteroides fragilis NOT DETECTED NOT DETECTED Final   Enterobacterales NOT DETECTED NOT DETECTED Final   Enterobacter cloacae complex NOT DETECTED NOT DETECTED Final   Escherichia coli NOT DETECTED NOT DETECTED Final   Klebsiella aerogenes NOT DETECTED NOT DETECTED Final   Klebsiella oxytoca NOT DETECTED NOT DETECTED Final   Klebsiella pneumoniae NOT DETECTED NOT DETECTED Final   Proteus species NOT DETECTED NOT DETECTED Final   Salmonella species NOT DETECTED NOT DETECTED Final   Serratia marcescens NOT DETECTED NOT DETECTED Final   Haemophilus influenzae NOT DETECTED NOT DETECTED Final   Neisseria meningitidis NOT DETECTED NOT DETECTED Final   Pseudomonas aeruginosa NOT DETECTED NOT DETECTED Final   Stenotrophomonas maltophilia NOT DETECTED NOT DETECTED Final   Candida albicans NOT DETECTED NOT DETECTED Final   Candida auris NOT DETECTED NOT DETECTED Final   Candida glabrata NOT DETECTED NOT DETECTED Final   Candida krusei NOT DETECTED NOT DETECTED Final   Candida parapsilosis NOT DETECTED NOT DETECTED Final   Candida tropicalis NOT DETECTED NOT DETECTED Final   Cryptococcus neoformans/gattii NOT DETECTED NOT DETECTED Final   Meth resistant mecA/C and MREJ NOT DETECTED NOT DETECTED Final    Comment:  Performed at [facility], [address] [place], [place] [address]  Culture, blood (Routine X 2) w Reflex to ID Panel     Status: None   Collection Time: 04/19/21 11:32 AM   Specimen: BLOOD RIGHT HAND  Result Value Ref Range Status   Specimen Description BLOOD RIGHT HAND  Final   Special Requests   Final    BOTTLES DRAWN AEROBIC AND ANAEROBIC Blood Culture adequate volume   Culture   Final    NO GROWTH 5 DAYS Performed at [facility], [place] [address] [place], [place] [address]    Report Status 04/24/2021 FINAL  Final  Culture, blood (Routine X 2) w Reflex to ID Panel     Status: None   Collection Time: 04/19/21 11:37 AM   Specimen: BLOOD LEFT HAND  Result Value Ref Range Status   Specimen Description BLOOD LEFT HAND  Final   Special Requests   Final    BOTTLES DRAWN AEROBIC AND ANAEROBIC Blood Culture adequate volume   Culture   Final    NO GROWTH 5 DAYS Performed at [facility], [place] [address], [place], [place] [address]    Report Status 04/24/2021 FINAL  Final         Radiology Studies: No results found.      Scheduled Meds:  allopurinol  100 mg Oral Daily   amiodarone  200 mg Oral Daily   apixaban  5 mg Oral BID   Chlorhexidine Gluconate Cloth  6 each Topical Daily   Chlorhexidine Gluconate Cloth  6 each Topical Q0600   darbepoetin (ARANESP) injection - DIALYSIS  150 mcg Intravenous Q Fri-HD   lanthanum  500 mg Oral TID WC   linaclotide  145  mcg Oral QAC breakfast   midodrine  20 mg Oral TID WC   predniSONE  40 mg Oral Q breakfast   Continuous Infusions:  sodium chloride Stopped (04/23/21 0830)   anticoagulant sodium citrate     anticoagulant sodium citrate      ceFAZolin (ANCEF) IV 100 mL/hr at 04/24/21 2200     LOS: 9 days    Time spent: 30 minutes    Barb Merino, MD [facility] Pager [phone]

## 2021-04-25 NOTE — Progress Notes (Signed)
BIPAP is at bedside setup with 6L O2 bled into circuit. Pt stated he will place himself on tonight when he is ready. RT instructed pt to have RN call RT if assistance is needed. RT will monitor as needed.

## 2021-04-25 NOTE — Progress Notes (Signed)
Patient states he feels tingling in hand. BP rechecked.

## 2021-04-25 NOTE — Progress Notes (Signed)
Treatment terminated early due to hypotension per Dr. Posey Pronto phone order. Patient with complaints of nausea relieved following rinse back and bp stabilization. Bp improved prior to patient returning to floor.

## 2021-04-25 NOTE — Progress Notes (Addendum)
Patient ID: Trevyon Doud, male   DOB: [birth year], 35 y.o.   MRN: [id number]  [facility] Progress Note   Assessment/ Plan:   MSSA bacteremia -likely catheter related bacteremia with positive blood cultures on 9/22 prompting removal of hemodialysis catheter on 9/24 due to exit site purulence to facilitate catheter holiday and clearance of bacteremia while on antibiotic therapy and had TDC replaced by IR on 9/28.  TEE is planned after optimization of volume status. Fatigue/ multi-joint pain -suspect that this is osteoarthritis with need to rule out septic arthritis with bacteremia (imaging limited by his inability to lay still because of pain).  On oxycodone and status post prednisone. Shortness of breath/volume overload-reportedly his O2 equipment ran out or malfunctioned.  He continues to struggle with limiting IDWG. He unfortunately displays continued nonadherence with nocturnal CPAP and reeducation continues.  Continue strict fluid restriction. ESRD - on HD TTS and today will be his third consecutive dialysis treatment after catheter holiday.  I appreciate the interventional radiology service in their help for placing his catheter. History of biventricular congestive heart failure-end-stage and not a candidate for assist device or transplant. Hypotension - on midodrine 20 tid Atrial fibrillation -appears rate controlled and is on Eliquis for anticoagulation.  Remains on amiodarone. CKD MBD-continue to reinforce renal diet along with phosphorus binder/adherence, elevated phosphorus level noted. Anemia of chronic kidney disease: Without overt blood loss, hemoglobin and hematocrit are lower and I will restart his ESA.  Subjective:   Multiple complaints this morning including dissatisfaction with fluid restriction, constipation and continued right hip and knee pain.  Had some cramping with hemodialysis yesterday.   Objective:   BP 114/73 (BP Location: Right Wrist)   Pulse 79   Temp  97.9 F (36.6 C) (Oral)   Resp 16   Ht '5\' 11"'$  (1.803 m)   Wt (!) 183.7 kg   SpO2 95%   BMI 56.48 kg/m   Physical Exam: Gen: Morbidly obese man, appearing uncomfortable resting in bed CVS: Pulse regular rhythm, normal rate, S1 and S2 normal Resp: Anteriorly clear to auscultation without any rales or rhonchi, no wheeze Abd: Soft, obese, nontender, bowel sounds normal Ext: 1+ lower extremity edema  Labs: BMET Recent Labs  Lab 04/19/21 0048 04/20/21 0051 04/22/21 0043 04/22/21 1040 04/23/21 0648 04/24/21 0726  NA 130* 129* 128*  --  132* 132*  K 4.7 4.7 6.6* 6.2* 4.9 5.0  CL 90* 91* 91*  --  93* 92*  CO2 '24 22 24  '$ --  26 27  GLUCOSE 112* 107* 165*  --  103* 139*  BUN 52* 72* 93*  --  61* 80*  CREATININE 5.17* 5.58* 5.06*  --  3.15* 3.37*  CALCIUM 9.5 9.8 10.0  --  9.8 9.7  PHOS 6.8* 8.1* 9.3*  --   --  6.0*   CBC Recent Labs  Lab 04/19/21 0048 04/20/21 0051 04/22/21 0641 04/24/21 0726  WBC 16.2* 14.4* 14.7* 11.3*  NEUTROABS 13.0* 11.8*  --   --   HGB 9.0* 10.3* 9.0* 8.7*  HCT 28.9* 32.2* 28.8* 28.3*  MCV 99.3 98.2 96.6 101.1*  PLT 343 341 374 404*      Medications:     allopurinol  100 mg Oral Daily   amiodarone  200 mg Oral Daily   apixaban  5 mg Oral BID   Chlorhexidine Gluconate Cloth  6 each Topical Daily   Chlorhexidine Gluconate Cloth  6 each Topical Q0600   darbepoetin (ARANESP) injection - DIALYSIS  150 mcg Intravenous Q Fri-HD   lanthanum  500 mg Oral TID WC   linaclotide  145 mcg Oral QAC breakfast   midodrine  20 mg Oral TID WC   predniSONE  40 mg Oral Q breakfast   Elmarie Shiley, MD 04/25/2021, 7:37 AM

## 2021-04-25 NOTE — Plan of Care (Signed)
  Problem: Education: Goal: Knowledge of General Education information will improve Description: Including pain rating scale, medication(s)/side effects and non-pharmacologic comfort measures Outcome: Progressing   Problem: Health Behavior/Discharge Planning: Goal: Ability to manage health-related needs will improve Outcome: Progressing   Problem: Clinical Measurements: Goal: Ability to maintain clinical measurements within normal limits will improve Outcome: Progressing Goal: Will remain free from infection Outcome: Progressing Goal: Diagnostic test results will improve Outcome: Progressing Goal: Respiratory complications will improve Outcome: Progressing Goal: Cardiovascular complication will be avoided Outcome: Progressing   Problem: Activity: Goal: Risk for activity intolerance will decrease Outcome: Progressing   Problem: Nutrition: Goal: Adequate nutrition will be maintained Outcome: Progressing   Problem: Coping: Goal: Level of anxiety will decrease Outcome: Progressing   Problem: Elimination: Goal: Will not experience complications related to bowel motility Outcome: Progressing Goal: Will not experience complications related to urinary retention Outcome: Progressing   Problem: Pain Managment: Goal: General experience of comfort will improve Outcome: Progressing   Problem: Safety: Goal: Ability to remain free from injury will improve Outcome: Progressing   Problem: Skin Integrity: Goal: Risk for impaired skin integrity will decrease Outcome: Progressing   Problem: Education: Goal: Ability to demonstrate management of disease process will improve Outcome: Progressing Goal: Ability to verbalize understanding of medication therapies will improve Outcome: Progressing Goal: Individualized Educational Video(s) Outcome: Progressing   Problem: Activity: Goal: Capacity to carry out activities will improve Outcome: Progressing   Problem: Cardiac: Goal:  Ability to achieve and maintain adequate cardiopulmonary perfusion will improve Outcome: Progressing   Problem: Education: Goal: Knowledge of disease and its progression will improve Outcome: Progressing   Problem: Health Behavior/Discharge Planning: Goal: Ability to manage health-related needs will improve Outcome: Progressing   Problem: Clinical Measurements: Goal: Complications related to the disease process or treatment will be avoided or minimized Outcome: Progressing Goal: Dialysis access will remain free of complications Outcome: Progressing   Problem: Activity: Goal: Activity intolerance will improve Outcome: Progressing   Problem: Fluid Volume: Goal: Fluid volume balance will be maintained or improved Outcome: Progressing   Problem: Respiratory: Goal: Respiratory symptoms related to disease process will be avoided Outcome: Progressing   Problem: Self-Concept: Goal: Body image disturbance will be avoided or minimized Outcome: Progressing   Problem: Urinary Elimination: Goal: Progression of disease will be identified and treated Outcome: Progressing

## 2021-04-25 NOTE — Progress Notes (Signed)
RN placed pt on CPA{ for the night.

## 2021-04-25 NOTE — Progress Notes (Signed)
Patient with hypotension during dialysis. 200 normal saline given. UF turned off. Dr. Posey Pronto notified. Telephone orders for no further fluid removal and to discontinue treatment in 30 minutes. Time adjusted.

## 2021-04-25 NOTE — Progress Notes (Signed)
200 normal saline given and UF turned off.

## 2021-04-26 DIAGNOSIS — J9611 Chronic respiratory failure with hypoxia: Secondary | ICD-10-CM | POA: Diagnosis not present

## 2021-04-26 NOTE — Progress Notes (Signed)
Currently PROGRESS NOTE    Phillip Young  [id number] DOB: 1985/12/13 DOA: 04/12/2021 PCP: [name], MD    Brief Narrative:  35 year old gentleman with severe biventricular systolic heart failure with known ejection fraction less than 20%, type 2 diabetes, paroxysmal A-flutter on Eliquis and  recently started on hemodialysis and recent extensive hospitalization with cardiogenic shock and cardiorenal syndrome progressed to ESRD presents back to the emergency room with shortness of breath, left wrist and right knee hurting.  Unable to ambulate around.  Home oxygen concentrator not working. At the emergency room Chest x-ray with pulmonary edema mostly chronic.  COVID-19 and flu negative.  Unable to ambulate.  Continues to complain of pain.  He was found to have MSSA bacteremia.  ID and nephrology consulted. After catheter holiday, he received on the permacath on 9/28 and restarted on hemodialysis. Continues to complain of pain mostly on the left hip, right knee.   Assessment & Plan:   Principal Problem:   MSSA bacteremia Active Problems:   Morbid obesity ([place])   GERD without esophagitis   OSA (obstructive sleep apnea)   Chronic systolic CHF (congestive heart failure) ([name])   Paroxysmal atrial flutter ([name])   Hyponatremia   ESRD (end stage renal disease) on dialysis ([place])   Acute on chronic respiratory failure with hypoxia ([name])   Medical non-compliance   Hypotension  MSSA bacteremia: Arthralgias.  Possible septic arthritis but unable to prove.  Unable to tolerate MRI. CT left hip 9/25, destructive changes. CT scan right knee 9/28, no acute findings. 4 out of 4 bottles positive for MSSA on admission Permacath removed, repeat blood cultures negative so far.  ID consulted.  Currently remains on cefazolin. Patient does have diffuse arthralgia, unable to prove septic joints.  Will explore if we can do noncontrast MRI as we need to try to avoid gadolinium in this  patient. TTE did not show any vegetation.  Too difficult to do TEE with current oxygen requirement.   TEE scheduled for tomorrow. Difficult to do physical exam on his joints, however no obvious evidence of swelling or effusion. On oxycodone, Flexeril.  Was given a short course of prednisone.  Acute on chronic respiratory failure with hypoxemia and hypercarbia: Due to malfunctioning concentrator at home.  Currently on 6-8 L oxygen and saturating adequate.  Keep on oxygen to keep saturation more than 90%.  Use BiPAP at night without interruption because of underlying obesity hypoventilation, sleep apnea and heart failure.  Stabilized. Oxygen requirement improving.  ESRD and hemodialysis: Received new permacath.  Receiving dialysis today.  Biventricular heart failure/paroxysmal atrial fibrillation/hypotension: Stable.  Known EF of less than 20%.  Followed by heart failure team.  Intolerance to advanced heart failure therapies. Midodrine 20 mg 3 times a day.  Fluid management with dialysis.  Not a candidate for ICD as per heart failure team. Currently in sinus rhythm on amiodarone.  Therapeutic on Eliquis.  Morbid obesity: BMI more than 50.  Started on semaglutide.  Ambulatory dysfunction: Work with PT OT.  Not medically stable for discharge.  Ultimate plans to go home with home health PT OT.     DVT prophylaxis:  apixaban (ELIQUIS) tablet 5 mg   Code Status: Full code Family Communication: Mother on the phone. Disposition Plan: Status is: Inpatient.   Dispo: The patient is from: Home              Anticipated d/c is to: SNF versus home with home health.  Patient currently is not medically stable.   Difficult to place patient No         Consultants:  Cardiology Nephrology Infectious disease Palliative care  Procedures:  None  Antimicrobials:  Cefazolin   Subjective: Patient seen and examined.  Early morning rounds, she was on the phone with his mother.   Complained of pain on his pressure points, left hip.  He wanted me to pick him up and changes position.  He wants to go to another floor.  Remains afebrile.  On repeat exam while sitting at the edge of the bed, he looks more comfortable.  Currently on 4 to 6 L of oxygen.  When he is sitting comfortably, he is only using 4 L.  Objective: Vitals:   04/26/21 0000 04/26/21 0355 04/26/21 0735 04/26/21 1145  BP: 120/82 110/75 109/68 (!) 101/54  Pulse: 70 74 79 80  Resp: (!) '21 20  16  '$ Temp: (!) 97.5 F (36.4 C) 98.2 F (36.8 C)  97.6 F (36.4 C)  TempSrc: Oral Oral  Oral  SpO2: 97% 98% 99% 92%  Weight:      Height:        Intake/Output Summary (Last 24 hours) at 04/26/2021 1259 Last data filed at 04/26/2021 0550 Gross per 24 hour  Intake 2124 ml  Output --  Net 2124 ml   Filed Weights   04/24/21 1106 04/25/21 0853 04/25/21 1141  Weight: (!) 183.7 kg (!) 184.9 kg (!) 183.6 kg    Examination:  General: Anxious on mobility and laying in bed.  Comfortable when positioned. Cardiovascular: S1-S2 normal.  Regular rate rhythm. Respiratory: No added sounds.  Bilateral clear.SpO2: 92 % O2 Flow Rate (L/min): 6 L/min FiO2 (%): (!) 6 %  Gastrointestinal: Soft.  Pendulous.  Nontender. Ext: No joint swelling or effusion.  Mobility is full range of motion.   Data Reviewed: I have personally reviewed following labs and imaging studies  CBC: Recent Labs  Lab 04/20/21 0051 04/22/21 0641 04/24/21 0726 04/25/21 0808  WBC 14.4* 14.7* 11.3* 10.6*  NEUTROABS 11.8*  --   --   --   HGB 10.3* 9.0* 8.7* 9.7*  HCT 32.2* 28.8* 28.3* 31.5*  MCV 98.2 96.6 101.1* 98.4  PLT 341 374 404* [id number]*   Basic Metabolic Panel: Recent Labs  Lab 04/20/21 0051 04/22/21 0043 04/22/21 1040 04/23/21 0648 04/24/21 0726 04/25/21 0808  NA 129* 128*  --  132* 132* 129*  K 4.7 6.6* 6.2* 4.9 5.0 5.5*  CL 91* 91*  --  93* 92* 92*  CO2 22 24  --  '26 27 26  '$ GLUCOSE 107* 165*  --  103* 139* 99  BUN 72* 93*  --   61* 80* 55*  CREATININE 5.58* 5.06*  --  3.15* 3.37* 3.47*  CALCIUM 9.8 10.0  --  9.8 9.7 9.9  PHOS 8.1* 9.3*  --   --  6.0* 6.8*   GFR: Estimated Creatinine Clearance: 49.4 mL/min (A) (by C-G formula based on SCr of 3.47 mg/dL (H)). Liver Function Tests: Recent Labs  Lab 04/20/21 0051 04/22/21 0043 04/24/21 0726 04/25/21 0808  ALBUMIN 2.6* 2.6* 2.6* 2.8*   No results for input(s): LIPASE, AMYLASE in the last 168 hours.  No results for input(s): AMMONIA in the last 168 hours. Coagulation Profile: Recent Labs  Lab 04/23/21 0648  INR 1.3*   Cardiac Enzymes: No results for input(s): CKTOTAL, CKMB, CKMBINDEX, TROPONINI in the last 168 hours. BNP (last 3 results) No results for input(s): PROBNP  in the last 8760 hours. HbA1C: No results for input(s): HGBA1C in the last 72 hours. CBG: No results for input(s): GLUCAP in the last 168 hours. Lipid Profile: No results for input(s): CHOL, HDL, LDLCALC, TRIG, CHOLHDL, LDLDIRECT in the last 72 hours. Thyroid Function Tests: No results for input(s): TSH, T4TOTAL, FREET4, T3FREE, THYROIDAB in the last 72 hours. Anemia Panel: No results for input(s): VITAMINB12, FOLATE, FERRITIN, TIBC, IRON, RETICCTPCT in the last 72 hours. Sepsis Labs: No results for input(s): PROCALCITON, LATICACIDVEN in the last 168 hours.  Recent Results (from the past 240 hour(s))  Culture, blood (Routine X 2) w Reflex to ID Panel     Status: Abnormal   Collection Time: 04/16/21  9:42 PM   Specimen: BLOOD  Result Value Ref Range Status   Specimen Description BLOOD BLOOD LEFT ARM  Final   Special Requests   Final    BOTTLES DRAWN AEROBIC AND ANAEROBIC Blood Culture adequate volume   Culture  Setup Time   Final    GRAM POSITIVE COCCI IN BOTH AEROBIC AND ANAEROBIC BOTTLES CRITICAL RESULT CALLED TO, READ BACK BY AND VERIFIED WITH: PHARMD A.LOWLES AT [id number] ON 04/17/2021 BY T.SAAD. Performed at [facility], [place] [address], [place], [place] [address]     Culture STAPHYLOCOCCUS AUREUS (A)  Final   Report Status 04/19/2021 FINAL  Final   Organism ID, Bacteria STAPHYLOCOCCUS AUREUS  Final      Susceptibility   Staphylococcus aureus - MIC*    CIPROFLOXACIN <=0.5 SENSITIVE Sensitive     ERYTHROMYCIN <=0.25 SENSITIVE Sensitive     GENTAMICIN <=0.5 SENSITIVE Sensitive     OXACILLIN <=0.25 SENSITIVE Sensitive     TETRACYCLINE <=1 SENSITIVE Sensitive     VANCOMYCIN <=0.5 SENSITIVE Sensitive     TRIMETH/SULFA <=10 SENSITIVE Sensitive     CLINDAMYCIN <=0.25 SENSITIVE Sensitive     RIFAMPIN <=0.5 SENSITIVE Sensitive     Inducible Clindamycin NEGATIVE Sensitive     * STAPHYLOCOCCUS AUREUS  Culture, blood (Routine X 2) w Reflex to ID Panel     Status: Abnormal   Collection Time: 04/16/21  9:42 PM   Specimen: BLOOD  Result Value Ref Range Status   Specimen Description BLOOD BLOOD RIGHT ARM  Final   Special Requests   Final    BOTTLES DRAWN AEROBIC AND ANAEROBIC Blood Culture results may not be optimal due to an inadequate volume of blood received in culture bottles   Culture  Setup Time   Final    GRAM POSITIVE COCCI IN BOTH AEROBIC AND ANAEROBIC BOTTLES CRITICAL VALUE NOTED.  VALUE IS CONSISTENT WITH PREVIOUSLY REPORTED AND CALLED VALUE.    Culture (A)  Final    STAPHYLOCOCCUS AUREUS SUSCEPTIBILITIES PERFORMED ON PREVIOUS CULTURE WITHIN THE LAST 5 DAYS. Performed at [facility], [place] [address] [place], [place] [address]    Report Status 04/19/2021 FINAL  Final  Blood Culture ID Panel (Reflexed)     Status: Abnormal   Collection Time: 04/16/21  9:42 PM  Result Value Ref Range Status   Enterococcus faecalis NOT DETECTED NOT DETECTED Final   Enterococcus Faecium NOT DETECTED NOT DETECTED Final   Listeria monocytogenes NOT DETECTED NOT DETECTED Final   Staphylococcus species DETECTED (A) NOT DETECTED Final    Comment: CRITICAL RESULT CALLED TO, READ BACK BY AND VERIFIED WITH: PHARMD A.LOWLES AT 1121 ON 04/17/2021 BY T.SAAD.     Staphylococcus aureus (BCID) DETECTED (A) NOT DETECTED Final    Comment: CRITICAL RESULT CALLED TO,  READ BACK BY AND VERIFIED WITH: PHARMD A.LOWLES AT [id number] ON 04/17/2021 BY T.SAAD.    Staphylococcus epidermidis NOT DETECTED NOT DETECTED Final   Staphylococcus lugdunensis NOT DETECTED NOT DETECTED Final   Streptococcus species NOT DETECTED NOT DETECTED Final   Streptococcus agalactiae NOT DETECTED NOT DETECTED Final   Streptococcus pneumoniae NOT DETECTED NOT DETECTED Final   Streptococcus pyogenes NOT DETECTED NOT DETECTED Final   A.calcoaceticus-baumannii NOT DETECTED NOT DETECTED Final   Bacteroides fragilis NOT DETECTED NOT DETECTED Final   Enterobacterales NOT DETECTED NOT DETECTED Final   Enterobacter cloacae complex NOT DETECTED NOT DETECTED Final   Escherichia coli NOT DETECTED NOT DETECTED Final   Klebsiella aerogenes NOT DETECTED NOT DETECTED Final   Klebsiella oxytoca NOT DETECTED NOT DETECTED Final   Klebsiella pneumoniae NOT DETECTED NOT DETECTED Final   Proteus species NOT DETECTED NOT DETECTED Final   Salmonella species NOT DETECTED NOT DETECTED Final   Serratia marcescens NOT DETECTED NOT DETECTED Final   Haemophilus influenzae NOT DETECTED NOT DETECTED Final   Neisseria meningitidis NOT DETECTED NOT DETECTED Final   Pseudomonas aeruginosa NOT DETECTED NOT DETECTED Final   Stenotrophomonas maltophilia NOT DETECTED NOT DETECTED Final   Candida albicans NOT DETECTED NOT DETECTED Final   Candida auris NOT DETECTED NOT DETECTED Final   Candida glabrata NOT DETECTED NOT DETECTED Final   Candida krusei NOT DETECTED NOT DETECTED Final   Candida parapsilosis NOT DETECTED NOT DETECTED Final   Candida tropicalis NOT DETECTED NOT DETECTED Final   Cryptococcus neoformans/gattii NOT DETECTED NOT DETECTED Final   Meth resistant mecA/C and MREJ NOT DETECTED NOT DETECTED Final    Comment: Performed at [facility], [address] [place], [place] [address]  Culture, blood  (Routine X 2) w Reflex to ID Panel     Status: None   Collection Time: 04/19/21 11:32 AM   Specimen: BLOOD RIGHT HAND  Result Value Ref Range Status   Specimen Description BLOOD RIGHT HAND  Final   Special Requests   Final    BOTTLES DRAWN AEROBIC AND ANAEROBIC Blood Culture adequate volume   Culture   Final    NO GROWTH 5 DAYS Performed at [facility], [place] [address] [place], [place] [address]    Report Status 04/24/2021 FINAL  Final  Culture, blood (Routine X 2) w Reflex to ID Panel     Status: None   Collection Time: 04/19/21 11:37 AM   Specimen: BLOOD LEFT HAND  Result Value Ref Range Status   Specimen Description BLOOD LEFT HAND  Final   Special Requests   Final    BOTTLES DRAWN AEROBIC AND ANAEROBIC Blood Culture adequate volume   Culture   Final    NO GROWTH 5 DAYS Performed at [facility], [place] [address], [place], [place] [address]    Report Status 04/24/2021 FINAL  Final         Radiology Studies: No results found.      Scheduled Meds:  allopurinol  100 mg Oral Daily   amiodarone  200 mg Oral Daily   apixaban  5 mg Oral BID   bisacodyl  10 mg Rectal Once   Chlorhexidine Gluconate Cloth  6 each Topical Daily   Chlorhexidine Gluconate Cloth  6 each Topical Q0600   darbepoetin (ARANESP) injection - DIALYSIS  150 mcg Intravenous Q Fri-HD   lanthanum  500 mg Oral TID WC   linaclotide  145 mcg Oral QAC breakfast   midodrine  20 mg Oral TID WC   polyethylene glycol  17 g Oral Once   Continuous Infusions:  sodium chloride Stopped (04/23/21 0830)   anticoagulant sodium citrate     anticoagulant sodium citrate      ceFAZolin (ANCEF) IV 1 g (04/25/21 1320)     LOS: 10 days    Time spent: 30 minutes    Barb Merino, MD [facility] Pager [phone]

## 2021-04-26 NOTE — Plan of Care (Signed)
  Problem: Clinical Measurements: Goal: Will remain free from infection Outcome: Progressing Goal: Respiratory complications will improve Outcome: Progressing Goal: Cardiovascular complication will be avoided Outcome: Progressing   Problem: Safety: Goal: Ability to remain free from injury will improve Outcome: Progressing   Problem: Education: Goal: Ability to verbalize understanding of medication therapies will improve Outcome: Progressing Goal: Individualized Educational Video(s) Outcome: Progressing   Problem: Cardiac: Goal: Ability to achieve and maintain adequate cardiopulmonary perfusion will improve Outcome: Progressing   Problem: Education: Goal: Knowledge of disease and its progression will improve Outcome: Progressing   Problem: Education: Goal: Knowledge of General Education information will improve Description: Including pain rating scale, medication(s)/side effects and non-pharmacologic comfort measures Outcome: Not Progressing   Problem: Health Behavior/Discharge Planning: Goal: Ability to manage health-related needs will improve Outcome: Not Progressing   Problem: Clinical Measurements: Goal: Ability to maintain clinical measurements within normal limits will improve Outcome: Not Progressing Goal: Diagnostic test results will improve Outcome: Not Progressing   Problem: Activity: Goal: Risk for activity intolerance will decrease Outcome: Not Progressing   Problem: Nutrition: Goal: Adequate nutrition will be maintained Outcome: Not Progressing   Problem: Coping: Goal: Level of anxiety will decrease Outcome: Not Progressing   Problem: Elimination: Goal: Will not experience complications related to bowel motility Outcome: Not Progressing Goal: Will not experience complications related to urinary retention Outcome: Not Progressing   Problem: Pain Managment: Goal: General experience of comfort will improve Outcome: Not Progressing   Problem:  Skin Integrity: Goal: Risk for impaired skin integrity will decrease Outcome: Not Progressing   Problem: Education: Goal: Ability to demonstrate management of disease process will improve Outcome: Not Progressing   Problem: Activity: Goal: Capacity to carry out activities will improve Outcome: Not Progressing   Problem: Health Behavior/Discharge Planning: Goal: Ability to manage health-related needs will improve Outcome: Not Progressing   Problem: Clinical Measurements: Goal: Complications related to the disease process or treatment will be avoided or minimized Outcome: Not Progressing Goal: Dialysis access will remain free of complications Outcome: Not Progressing   Problem: Activity: Goal: Activity intolerance will improve Outcome: Not Progressing   Problem: Fluid Volume: Goal: Fluid volume balance will be maintained or improved Outcome: Not Progressing   Problem: Nutritional: Goal: Ability to make appropriate dietary choices will improve Outcome: Not Progressing   Problem: Respiratory: Goal: Respiratory symptoms related to disease process will be avoided Outcome: Not Progressing   Problem: Self-Concept: Goal: Body image disturbance will be avoided or minimized Outcome: Not Progressing   Problem: Urinary Elimination: Goal: Progression of disease will be identified and treated Outcome: Not Progressing

## 2021-04-26 NOTE — Plan of Care (Signed)
  Problem: Education: Goal: Knowledge of General Education information will improve Description: Including pain rating scale, medication(s)/side effects and non-pharmacologic comfort measures Outcome: Progressing   Problem: Clinical Measurements: Goal: Ability to maintain clinical measurements within normal limits will improve Outcome: Progressing Goal: Will remain free from infection Outcome: Progressing Goal: Respiratory complications will improve Outcome: Progressing Goal: Cardiovascular complication will be avoided Outcome: Progressing   Problem: Nutrition: Goal: Adequate nutrition will be maintained Outcome: Progressing   Problem: Coping: Goal: Level of anxiety will decrease Outcome: Progressing   Problem: Pain Managment: Goal: General experience of comfort will improve Outcome: Progressing   Problem: Safety: Goal: Ability to remain free from injury will improve Outcome: Progressing   Problem: Skin Integrity: Goal: Risk for impaired skin integrity will decrease Outcome: Progressing   Problem: Education: Goal: Ability to demonstrate management of disease process will improve Outcome: Progressing Goal: Ability to verbalize understanding of medication therapies will improve Outcome: Progressing Goal: Individualized Educational Video(s) Outcome: Progressing   Problem: Activity: Goal: Capacity to carry out activities will improve Outcome: Progressing   Problem: Cardiac: Goal: Ability to achieve and maintain adequate cardiopulmonary perfusion will improve Outcome: Progressing   Problem: Education: Goal: Knowledge of disease and its progression will improve Outcome: Progressing   Problem: Health Behavior/Discharge Planning: Goal: Ability to manage health-related needs will improve Outcome: Progressing   Problem: Clinical Measurements: Goal: Complications related to the disease process or treatment will be avoided or minimized Outcome: Progressing Goal:  Dialysis access will remain free of complications Outcome: Progressing   Problem: Fluid Volume: Goal: Fluid volume balance will be maintained or improved Outcome: Progressing   Problem: Nutritional: Goal: Ability to make appropriate dietary choices will improve Outcome: Progressing   Problem: Respiratory: Goal: Respiratory symptoms related to disease process will be avoided Outcome: Progressing   Problem: Self-Concept: Goal: Body image disturbance will be avoided or minimized Outcome: Progressing

## 2021-04-26 NOTE — Progress Notes (Signed)
Patient ID: Phillip Young, male   DOB: December 27, 1985, 35 y.o.   MRN: [id number]  [facility] Progress Note   Assessment/ Plan:   MSSA bacteremia -likely catheter related bacteremia with positive blood cultures on 9/22 prompting removal of hemodialysis catheter on 9/24 due to exit site purulence to facilitate catheter holiday and clearance of bacteremia (negative blood cultures 9/25) on cefazolin and had TDC replaced by IR on 9/28.  Plans noted for TEE tomorrow to assess for SBE. Fatigue/ multi-joint pain -suspect that this is osteoarthritis with need to rule out septic arthritis with bacteremia (imaging limited by his inability to lay still because of pain but may need to be reattempted with mild sedation).  Continue PT/OT. Shortness of breath/volume overload-reportedly his O2 equipment ran out or malfunctioned.  He continues to struggle with limiting IDWG. He unfortunately displays continued nonadherence with nocturnal CPAP and reeducation continues.  Continue strict fluid restriction. ESRD - on HD TTS with 3 consecutive dialysis treatments in the latter part of the week after replacement of TDC-Next dialysis will be 10/4 unless acute indications emerge.  I appreciate the interventional radiology service in their help for placing his catheter. History of biventricular congestive heart failure-end-stage and not a candidate for assist device or transplant. Hypotension - on midodrine 20 tid Atrial fibrillation -he currently appears to be in sinus rhythm and remains on/amiodarone CKD MBD-continue to reinforce renal diet along with phosphorus binder/adherence, elevated phosphorus level noted. Anemia of chronic kidney disease: Without overt blood loss, hemoglobin and hematocrit are improving on darbepoetin.   Subjective:   Primarily complaining of pain/discomfort over left hip and right knee-discontent with bed/repositioning measures.  His mother is on the cell phone and inquires about aquatic  therapy.   Objective:   BP 109/68 (BP Location: Right Wrist)   Pulse 79   Temp 98.2 F (36.8 C) (Oral)   Resp 20   Ht '5\' 11"'$  (1.803 m)   Wt (!) 183.6 kg   SpO2 99%   BMI 56.45 kg/m   Physical Exam: Gen: Morbidly obese man, appears uncomfortable, speaking on phone CVS: Pulse regular rhythm, normal rate, S1 and S2 normal Resp: Anteriorly clear to auscultation without any rales or rhonchi, no wheeze Abd: Soft, obese, nontender, bowel sounds normal Ext: 1+ lower extremity edema  Labs: BMET Recent Labs  Lab 04/20/21 0051 04/22/21 0043 04/22/21 1040 04/23/21 0648 04/24/21 0726 04/25/21 0808  NA 129* 128*  --  132* 132* 129*  K 4.7 6.6* 6.2* 4.9 5.0 5.5*  CL 91* 91*  --  93* 92* 92*  CO2 22 24  --  '26 27 26  '$ GLUCOSE 107* 165*  --  103* 139* 99  BUN 72* 93*  --  61* 80* 55*  CREATININE 5.58* 5.06*  --  3.15* 3.37* 3.47*  CALCIUM 9.8 10.0  --  9.8 9.7 9.9  PHOS 8.1* 9.3*  --   --  6.0* 6.8*   CBC Recent Labs  Lab 04/20/21 0051 04/22/21 0641 04/24/21 0726 04/25/21 0808  WBC 14.4* 14.7* 11.3* 10.6*  NEUTROABS 11.8*  --   --   --   HGB 10.3* 9.0* 8.7* 9.7*  HCT 32.2* 28.8* 28.3* 31.5*  MCV 98.2 96.6 101.1* 98.4  PLT 341 374 404* 447*      Medications:     allopurinol  100 mg Oral Daily   amiodarone  200 mg Oral Daily   apixaban  5 mg Oral BID   bisacodyl  10 mg Rectal Once  Chlorhexidine Gluconate Cloth  6 each Topical Daily   Chlorhexidine Gluconate Cloth  6 each Topical Q0600   darbepoetin (ARANESP) injection - DIALYSIS  150 mcg Intravenous Q Fri-HD   lanthanum  500 mg Oral TID WC   linaclotide  145 mcg Oral QAC breakfast   midodrine  20 mg Oral TID WC   polyethylene glycol  17 g Oral Once   Elmarie Shiley, MD 04/26/2021, 9:03 AM

## 2021-04-27 ENCOUNTER — Encounter (HOSPITAL_COMMUNITY)
Admission: EM | Disposition: A | Discharge status: Home / Self Care | Payer: Self-pay | Source: Home / Self Care | Attending: Internal Medicine

## 2021-04-27 DIAGNOSIS — J9611 Chronic respiratory failure with hypoxia: Secondary | ICD-10-CM | POA: Diagnosis not present

## 2021-04-27 DIAGNOSIS — J9621 Acute and chronic respiratory failure with hypoxia: Secondary | ICD-10-CM | POA: Diagnosis not present

## 2021-04-27 DIAGNOSIS — Z992 Dependence on renal dialysis: Secondary | ICD-10-CM

## 2021-04-27 DIAGNOSIS — N186 End stage renal disease: Secondary | ICD-10-CM

## 2021-04-27 DIAGNOSIS — I509 Heart failure, unspecified: Secondary | ICD-10-CM | POA: Diagnosis not present

## 2021-04-27 DIAGNOSIS — Z789 Other specified health status: Secondary | ICD-10-CM

## 2021-04-27 DIAGNOSIS — R7881 Bacteremia: Secondary | ICD-10-CM | POA: Diagnosis not present

## 2021-04-27 DIAGNOSIS — Z515 Encounter for palliative care: Secondary | ICD-10-CM

## 2021-04-27 DIAGNOSIS — Z711 Person with feared health complaint in whom no diagnosis is made: Secondary | ICD-10-CM

## 2021-04-27 SURGERY — CANCELLED PROCEDURE

## 2021-04-27 MED ORDER — HYDROMORPHONE HCL 1 MG/ML IJ SOLN
0.5000 mg | Freq: Four times a day (QID) | INTRAMUSCULAR | Status: DC | PRN
Start: 2021-04-27 — End: 2021-04-28
  Administered 2021-04-27 – 2021-04-28 (×3): 0.5 mg via INTRAVENOUS
  Filled 2021-04-27 (×3): qty 0.5

## 2021-04-27 MED ORDER — LORAZEPAM 2 MG/ML IJ SOLN: 1.0000 mg | Freq: Once | INTRAMUSCULAR | Status: DC

## 2021-04-27 NOTE — Progress Notes (Signed)
Daily Progress Note   Patient Name: Phillip Young       Date: 04/27/2021 DOB: 02-12-1986  Age: 35 y.o. MRN#: [id number] Attending Physician: Barb Merino, MD Primary Care Physician: [name], MD Admit Date: 04/12/2021  Reason for Consultation/Follow-up: Establishing goals of care  Subjective: Received notification from [facility] unit director that patient has stated his wishes are for hospice care.   Chart review performed. Received detailed report from unit [facility]. Received report from primary RN - no acute concerns.  After discussing case with director and RN, patient was found sleeping. I was asked not to wake him up as he does not sleep well. Informed director and RN to notify me when he wakes up so I can return for continued GOC.    3:00 PM Notified patient is awake.  4:30 PM Returned to patient's bedside for continued GOC. He had recently received pain medication and is again sleeping. RN states he requested not to be woken up. Discussed pain medication with RN.   Will attempt [place] tomorrow AM.   Length of Stay: 11  Current Medications: Scheduled Meds:   allopurinol  100 mg Oral Daily   amiodarone  200 mg Oral Daily   apixaban  5 mg Oral BID   bisacodyl  10 mg Rectal Once   Chlorhexidine Gluconate Cloth  6 each Topical Daily   Chlorhexidine Gluconate Cloth  6 each Topical Q0600   darbepoetin (ARANESP) injection - DIALYSIS  150 mcg Intravenous Q Fri-HD   lanthanum  500 mg Oral TID WC   linaclotide  145 mcg Oral QAC breakfast   midodrine  20 mg Oral TID WC   polyethylene glycol  17 g Oral Once    Continuous Infusions:  sodium chloride Stopped (04/23/21 0830)   anticoagulant sodium citrate     anticoagulant sodium citrate      ceFAZolin (ANCEF) IV 1 g  (04/26/21 1306)    PRN Meds: acetaminophen **OR** acetaminophen, anticoagulant sodium citrate, cyclobenzaprine, HYDROmorphone (DILAUDID) injection, ipratropium-albuterol, ondansetron **OR** ondansetron (ZOFRAN) IV, oxyCODONE, sodium chloride  Physical Exam Vitals and nursing note reviewed.  Constitutional:      General: He is sleeping. He is not in acute distress.    Appearance: He is obese.  Pulmonary:     Effort: No respiratory distress.  Skin:  General: Skin is warm and dry.  Neurological:     Motor: Weakness present.            Vital Signs: BP 109/66 (BP Location: Right Wrist)   Pulse 86   Temp 97.8 F (36.6 C)   Resp 20   Ht '5\' 11"'$  (1.803 m)   Wt (!) 183.6 kg   SpO2 100%   BMI 56.45 kg/m  SpO2: SpO2: 100 % O2 Device: O2 Device: Nasal Cannula O2 Flow Rate: O2 Flow Rate (L/min): 6 L/min  Intake/output summary:  Intake/Output Summary (Last 24 hours) at 04/27/2021 1322 Last data filed at 04/27/2021 0736 Gross per 24 hour  Intake 472 ml  Output 625 ml  Net -153 ml   LBM: Last BM Date: 04/12/21 Baseline Weight: Weight: (!) 189.4 kg Most recent weight: Weight: (!) 183.6 kg       Palliative Assessment/Data: PPS 30%      Patient Active Problem List   Diagnosis Date Noted   Acute on chronic respiratory failure with hypoxia ([name]) 04/21/2021   Medical non-compliance 04/21/2021   Hypotension 04/21/2021   MSSA bacteremia    ESRD (end stage renal disease) on dialysis ([place]) 04/16/2021   ESRD (end stage renal disease) ([place]) 04/12/2021   Paroxysmal atrial flutter ([place]) 04/12/2021   Acute respiratory failure with hypoxia ([name]) 04/12/2021   Hyponatremia 04/12/2021   Dyspnea    Cardiogenic shock ([name])    Atrial flutter with rapid ventricular response ([place]) 02/11/2021   Elevated troponin 02/11/2021   Chronic respiratory failure with hypoxia ([place]) 02/11/2021   CAP (community acquired pneumonia) [id number]   Chronic systolic CHF (congestive heart failure) ([name])     Hypokalemia    CHF (congestive heart failure) ([name])    Chronic cough    Acute on chronic systolic (congestive) heart failure ([place]) 02/26/2020   Morbid obesity ([place]) 02/26/2020   GERD without esophagitis 02/26/2020   Diuretic-induced hypokalemia 02/26/2020   OSA (obstructive sleep apnea) 02/26/2020   Hidradenitis suppurativa 02/26/2020   Prediabetes 02/26/2020    Palliative Care Assessment & Plan   Patient Profile: 35 y.o. male  with past medical history of severe biventricular CHF with EF < 20%, DM type 2, paroxysmal atrial flutter on Eliquis, OSA, morbid obesity, and ESRD on HD. He presented to the emergency department on 04/12/2021 with shortness of breath for the past 5-6 days. He noted his O2 saturation to be 78% at home, and was questioning whether his home O2 concentrator was working properly. O2 saturation improved as soon as EMS placed him on oxygen. In the ED, chest x-ray showed pulmonary edema. BNP 658. He was admitted to [facility] with chronic respiratory failure.  He was also found to have MSSA bacteremia, likely from HD catheter which was subsequently removed.    Patient was hospitalized 02/11/21 - 03/26/21 with cardiogenic shock and cardiorenal syndrome with progressed to ESRD. He initially required CRRT, but was discharged on TTS dialysis.    Assessment: MSSA bacteremia Chronic systolic CHF, fluid overload ESRD on HD Acute on chronic respiratory failure with hypoxia OSA Multi-joint pain Constipation  Recommendations/Plan: Continue current medical treatment Continue full code status  PMT will attempt to continue [place] tomorrow when patient is awake PMT will continue to follow and support holistically  Goals of Care and Additional Recommendations: Limitations on Scope of Treatment: Full Scope Treatment  Code Status:    Code Status Orders  (From admission, onward)           Start  Ordered   04/12/21 2154  Full code  Continuous        04/12/21 2154            Code Status History     Date Active Date Inactive Code Status Order ID Comments User Context   02/11/2021 1849 03/26/2021 1740 Full Code [id number]  Orma Flaming, MD ED   10/08/2020 1424 10/16/2020 2059 Full Code [id number]  Asencion Noble, MD ED   02/26/2020 2340 03/11/2020 2330 Full Code [id number]  Shalhoub, Sherryll Burger, MD ED       Prognosis:  Poor in the setting of end stage chronic illnesses and multiple comorbidities  Discharge Planning: To Be Determined  Care plan was discussed with [place] Unit Director, primary RN  Thank you for allowing the Palliative Medicine Team to assist in the care of this patient.   Total Time 35 minutes Prolonged Time Billed  no       Greater than 50%  of this time was spent counseling and coordinating care related to the above assessment and plan.  Lin Landsman, NP  Please contact Palliative Medicine Team phone at [phone] for questions and concerns.

## 2021-04-27 NOTE — Progress Notes (Signed)
Currently PROGRESS NOTE    Phillip Young  [id number] DOB: Dec 14, 1985 DOA: 04/12/2021 PCP: [name], MD    Brief Narrative:  35 year old gentleman with severe biventricular systolic heart failure with known ejection fraction less than 20%, type 2 diabetes, paroxysmal A-flutter on Eliquis and  recently started on hemodialysis and recent extensive hospitalization with cardiogenic shock and cardiorenal syndrome progressed to ESRD presented back to the emergency room with shortness of breath, left wrist and right knee hurting.  Unable to ambulate around.  Home oxygen concentrator not working. At the emergency room Chest x-ray with pulmonary edema mostly chronic.  COVID-19 and flu negative.  Unable to ambulate.  Continues to complain of pain.  He was found to have MSSA bacteremia due to infected dialysis catheter. ID and nephrology consulted. After catheter holiday, he received new permacath on 9/28 and restarted on hemodialysis. Continues to complain of pain mostly on the left hip, right knee. Subsequent CT scans with no evidence of joint fluid or collections.  Pain issues remain consistent. 10/3, for TEE today.  Patient is agreeable.  He is also agreeable to go for MRI of the left hip.  Will not use gadolinium.   Assessment & Plan:   Principal Problem:   MSSA bacteremia Active Problems:   Morbid obesity ([place])   GERD without esophagitis   OSA (obstructive sleep apnea)   Chronic systolic CHF (congestive heart failure) ([name])   Paroxysmal atrial flutter ([name])   Hyponatremia   ESRD (end stage renal disease) on dialysis ([name])   Acute on chronic respiratory failure with hypoxia ([name])   Medical non-compliance   Hypotension  MSSA bacteremia: Arthralgias.  Possible septic arthritis but unable to prove.   CT left hip 9/25, destructive changes. CT scan right knee 9/28, no acute findings. 4 out of 4 bottles positive for MSSA on 9/22.  Echocardiogram transthoracic with no evidence  of vegetation. Permacath removed, repeat blood cultures negative so far.  ID consulted.  Currently remains on cefazolin. Patient does have diffuse arthralgia, unable to prove septic joints.  Agreeable for TEE today.  He is also agreeable for MRI left hip today.  If any evidence of septic arthritis or fluid collection, will consult orthopedics.  Treatment may be just antibiotics. Difficult to do physical exam on his joints, however no obvious evidence of swelling or effusion. On oxycodone, Flexeril.  Was given a short course of prednisone.  Acute on chronic respiratory failure with hypoxemia and hypercarbia: Due to malfunctioning concentrator at home and now with fluid overload. Oxygen requirement is improving.  Anywhere between 4 to 8 L of oxygen.  Using BiPAP at night without interruption.  Hopefully his oxygenation will improve with ongoing hemodialysis.   ESRD and hemodialysis: Received new permacath.  Receiving dialysis on schedule.  Biventricular heart failure/paroxysmal atrial fibrillation/hypotension: Stable.  Known EF of less than 20%.  Followed by heart failure team.  Intolerance to advanced heart failure therapies. Midodrine 20 mg 3 times a day.  Fluid management with dialysis.  Not a candidate for ICD as per heart failure team. Currently in sinus rhythm on amiodarone.  Therapeutic on Eliquis. Challenging to tolerate hemodialysis with EF less than 20%, however so far he is able to get dialysis 3 times a week.  Morbid obesity: BMI more than 50.  on semaglutide.  Ambulatory dysfunction: Work with PT OT.  Not medically stable for discharge.  Ultimate plans to go home with home health PT OT.     DVT prophylaxis:  apixaban (ELIQUIS)  tablet 5 mg   Code Status: Full code Family Communication: None. Disposition Plan: Status is: Inpatient.   Dispo: The patient is from: Home              Anticipated d/c is to: SNF versus home with home health.              Patient currently is  not medically stable.   Difficult to place patient No         Consultants:  Cardiology Nephrology Infectious disease Palliative care  Procedures:  None  Antimicrobials:  Cefazolin   Subjective: Patient seen and examined.  Early morning rounds, he said he will not get any test done or not go to dialysis until his pain is improved. I was able to convince him that all the test, dialysis should go side-by-side while we are working on relieving his pain.  Then he agreed to go for TEE. Previously he did not agree to go for MRI because of pain, now he tells me he will try.  Will order left hip MRI.  Can use 1 dose of injectable Dilaudid while going for MRI. He did not agree to move to bariatric bed last night because that was not appropriate time for him to move.  Objective: Vitals:   04/26/21 1922 04/27/21 0004 04/27/21 0339 04/27/21 0722  BP: (!) 96/57 106/64 121/76 109/66  Pulse: 78 75 83 86  Resp: '15 16 18 20  '$ Temp: (!) 97.5 F (36.4 C) (!) 97.4 F (36.3 C) 98 F (36.7 C) 97.8 F (36.6 C)  TempSrc: Oral Oral Oral   SpO2: 100% 100% 99% 100%  Weight:      Height:        Intake/Output Summary (Last 24 hours) at 04/27/2021 1042 Last data filed at 04/27/2021 0736 Gross per 24 hour  Intake 708 ml  Output 625 ml  Net 83 ml   Filed Weights   04/24/21 1106 04/25/21 0853 04/25/21 1141  Weight: (!) 183.7 kg (!) 184.9 kg (!) 183.6 kg    Examination:  General: Anxious, mild distress due to pain on the left hip.  Left wrist pain has improved. Cardiovascular: S1-S2 normal.  Regular rate rhythm. Respiratory: No added sounds.  Bilateral clear.SpO2: 100 % O2 Flow Rate (L/min): 6 L/min FiO2 (%): (!) 6 %  Gastrointestinal: Soft.  Pendulous.  Nontender. Ext: No joint swelling or effusion.  Mobility is full range of motion.   Data Reviewed: I have personally reviewed following labs and imaging studies  CBC: Recent Labs  Lab 04/22/21 0641 04/24/21 0726 04/25/21 0808   WBC 14.7* 11.3* 10.6*  HGB 9.0* 8.7* 9.7*  HCT 28.8* 28.3* 31.5*  MCV 96.6 101.1* 98.4  PLT 374 404* [id number]*   Basic Metabolic Panel: Recent Labs  Lab 04/22/21 0043 04/22/21 1040 04/23/21 0648 04/24/21 0726 04/25/21 0808  NA 128*  --  132* 132* 129*  K 6.6* 6.2* 4.9 5.0 5.5*  CL 91*  --  93* 92* 92*  CO2 24  --  '26 27 26  '$ GLUCOSE 165*  --  103* 139* 99  BUN 93*  --  61* 80* 55*  CREATININE 5.06*  --  3.15* 3.37* 3.47*  CALCIUM 10.0  --  9.8 9.7 9.9  PHOS 9.3*  --   --  6.0* 6.8*   GFR: Estimated Creatinine Clearance: 49.4 mL/min (A) (by C-G formula based on SCr of 3.47 mg/dL (H)). Liver Function Tests: Recent Labs  Lab 04/22/21 0043 04/24/21 0726 04/25/21  [id number]  ALBUMIN 2.6* 2.6* 2.8*   No results for input(s): LIPASE, AMYLASE in the last 168 hours.  No results for input(s): AMMONIA in the last 168 hours. Coagulation Profile: Recent Labs  Lab 04/23/21 0648  INR 1.3*   Cardiac Enzymes: No results for input(s): CKTOTAL, CKMB, CKMBINDEX, TROPONINI in the last 168 hours. BNP (last 3 results) No results for input(s): PROBNP in the last 8760 hours. HbA1C: No results for input(s): HGBA1C in the last 72 hours. CBG: No results for input(s): GLUCAP in the last 168 hours. Lipid Profile: No results for input(s): CHOL, HDL, LDLCALC, TRIG, CHOLHDL, LDLDIRECT in the last 72 hours. Thyroid Function Tests: No results for input(s): TSH, T4TOTAL, FREET4, T3FREE, THYROIDAB in the last 72 hours. Anemia Panel: No results for input(s): VITAMINB12, FOLATE, FERRITIN, TIBC, IRON, RETICCTPCT in the last 72 hours. Sepsis Labs: No results for input(s): PROCALCITON, LATICACIDVEN in the last 168 hours.  Recent Results (from the past 240 hour(s))  Culture, blood (Routine X 2) w Reflex to ID Panel     Status: None   Collection Time: 04/19/21 11:32 AM   Specimen: BLOOD RIGHT HAND  Result Value Ref Range Status   Specimen Description BLOOD RIGHT HAND  Final   Special Requests   Final     BOTTLES DRAWN AEROBIC AND ANAEROBIC Blood Culture adequate volume   Culture   Final    NO GROWTH 5 DAYS Performed at [facility], [address], [place], [place] [address]    Report Status 04/24/2021 FINAL  Final  Culture, blood (Routine X 2) w Reflex to ID Panel     Status: None   Collection Time: 04/19/21 11:37 AM   Specimen: BLOOD LEFT HAND  Result Value Ref Range Status   Specimen Description BLOOD LEFT HAND  Final   Special Requests   Final    BOTTLES DRAWN AEROBIC AND ANAEROBIC Blood Culture adequate volume   Culture   Final    NO GROWTH 5 DAYS Performed at [facility], [place] [address] [place], [place] [address]    Report Status 04/24/2021 FINAL  Final         Radiology Studies: No results found.      Scheduled Meds:  allopurinol  100 mg Oral Daily   amiodarone  200 mg Oral Daily   apixaban  5 mg Oral BID   bisacodyl  10 mg Rectal Once   Chlorhexidine Gluconate Cloth  6 each Topical Daily   Chlorhexidine Gluconate Cloth  6 each Topical Q0600   darbepoetin (ARANESP) injection - DIALYSIS  150 mcg Intravenous Q Fri-HD   lanthanum  500 mg Oral TID WC   linaclotide  145 mcg Oral QAC breakfast   midodrine  20 mg Oral TID WC   polyethylene glycol  17 g Oral Once   Continuous Infusions:  sodium chloride Stopped (04/23/21 0830)   anticoagulant sodium citrate     anticoagulant sodium citrate      ceFAZolin (ANCEF) IV 1 g (04/26/21 1306)     LOS: 11 days    Time spent: 30 minutes    Barb Merino, MD [facility] Pager [phone]

## 2021-04-27 NOTE — TOC Initial Note (Signed)
Transition of Care [facility]) - Initial/Assessment Note    Patient Details  Name: Phillip Young MRN: [id number] Date of Birth: 1985-10-02  [facility] CM/SW Contact:    Tresa Endo Phone Number: 04/27/2021, 3:37 PM  Clinical Narrative:                 CSW received SNF consult. CSW met with pt at bedside. CSW introduced self and explained role at the hospital. Pt reports that PTA the pt lived at home with relatives. PT reports pt could ambulate with RW prior to pt arrival. PT reports pt is MaxA+2 and has been very unpleasant with staff because of pain, pt mentioned pain management to CSW during assessment and stated that the only way he can ambulate or sleep is with medication.   CSW reviewed PT/OT recommendations for SNF. Pt reports wanting to go to a SNF for rehab to be able to walk again. Pt stats he feels limited and has not been restricted in walking before. Pt gave CSW permission to fax out to facilities in the area. Pt has no preference of facility at this time. CSW gave pt medicare.gov rating list to review. PT reports they are covid negative but has no covid vaccinations.   CSW will continue to follow.    Expected Discharge Plan: Skilled Nursing Facility Barriers to Discharge: Continued Medical Work up   Patient Goals and CMS Choice Patient states their goals for this hospitalization and ongoing recovery are:: Rehab to get back to baseline CMS Medicare.gov Compare Post Acute Care list provided to:: Patient Choice offered to / list presented to : Patient  Expected Discharge Plan and Services Expected Discharge Plan: [place] In-house Referral: Clinical Social Work Discharge Planning Services: CM Consult Post Acute Care Choice: [place] arrangements for the past 2 months: [place]                   DME Agency: [employer] Date DME Agency Contacted: 04/14/21 Time DME Agency Contacted: [phone] Representative  spoke with at DME Agency: Andree Coss            Prior Living Arrangements/Services Living arrangements for the past 2 months: [place] Lives with:: Relatives Patient language and need for interpreter reviewed:: Yes Do you feel safe going back to the place where you live?: Yes      Need for Family Participation in Patient Care: No (Comment) Care giver support system in place?: Yes (comment) Current home services: DME ([employer]), oxygen ([employer])) Criminal Activity/Legal Involvement Pertinent to Current Situation/Hospitalization: No - Comment as needed  Activities of Daily Living Home Assistive Devices/Equipment: Oxygen ADL Screening (condition at time of admission) Patient's cognitive ability adequate to safely complete daily activities?: No Is the patient deaf or have difficulty hearing?: No Does the patient have difficulty seeing, even when wearing glasses/contacts?: No Does the patient have difficulty concentrating, remembering, or making decisions?: No Patient able to express need for assistance with ADLs?: Yes Does the patient have difficulty dressing or bathing?: Yes Independently performs ADLs?: Yes (appropriate for developmental age) Does the patient have difficulty walking or climbing stairs?: Yes Weakness of Legs: Both Weakness of Arms/Hands: Both  Permission Sought/Granted Permission sought to share information with : Case Manager, Family Supports, PCP Permission granted to share information with : Yes, Verbal Permission Granted  Share Information with NAME: Phillip Young  Permission granted to share info w AGENCY: SNF  Permission granted to share  info w Relationship: Mother  Permission granted to share info w Contact Information: [phone]  Emotional Assessment Appearance:: Appears stated age Attitude/Demeanor/Rapport: Engaged Affect (typically observed): Accepting Orientation: : Oriented to Self, Oriented to Place, Oriented to  Time,  Oriented to Situation Alcohol / Substance Use: Not Applicable Psych Involvement: No (comment)  Admission diagnosis:  Morbid obesity ([place]) [E66.01] Pleural effusion [J90] ESRD on dialysis ([place]) [N18.6, Z99.2] Acute respiratory failure with hypoxia ([name]) [J96.01] Pulmonary vascular congestion [R09.89] Acute on chronic systolic CHF (congestive heart failure) ([name]) [I50.23] Acute on chronic congestive heart failure, unspecified heart failure type ([name]) [I50.9] ESRD (end stage renal disease) on dialysis ([place]) [N18.6, Z99.2] Patient Active Problem List   Diagnosis Date Noted   Acute on chronic respiratory failure with hypoxia ([place]) 04/21/2021   Medical non-compliance 04/21/2021   Hypotension 04/21/2021   MSSA bacteremia    ESRD (end stage renal disease) on dialysis ([place]) 04/16/2021   ESRD (end stage renal disease) ([place]) 04/12/2021   Paroxysmal atrial flutter ([place]) 04/12/2021   Acute respiratory failure with hypoxia ([name]) 04/12/2021   Hyponatremia 04/12/2021   Dyspnea    Cardiogenic shock ([name])    Atrial flutter with rapid ventricular response ([place]) 02/11/2021   Elevated troponin 02/11/2021   Chronic respiratory failure with hypoxia ([place]) 02/11/2021   CAP (community acquired pneumonia) 13/64/3837   Chronic systolic CHF (congestive heart failure) ([name])    Hypokalemia    CHF (congestive heart failure) ([name])    Chronic cough    Acute on chronic systolic (congestive) heart failure ([place]) 02/26/2020   Morbid obesity ([place]) 02/26/2020   GERD without esophagitis 02/26/2020   Diuretic-induced hypokalemia 02/26/2020   OSA (obstructive sleep apnea) 02/26/2020   Hidradenitis suppurativa 02/26/2020   Prediabetes 02/26/2020   PCP:  [name], MD Pharmacy:   [facility] [phone] - [place], [place] - [address] AT [place] [address] [place] [address] Phone: [phone] Fax: [phone]  [facility] [address]  [place] [address] Phone: [phone] Fax: [phone]     Social Determinants of Health (SDOH) Interventions Food Insecurity Interventions: Assist with SNAP Application Financial Strain Interventions: Other (Comment) (Referral to [facility]) Housing Interventions: Intervention Not Indicated Transportation Interventions: Intervention Not Indicated  Readmission Risk Interventions Readmission Risk Prevention Plan 10/16/2020 10/16/2020 10/14/2020  Transportation Screening Complete Complete Complete  PCP or Specialist Appt within 5-7 Days Complete Complete Complete  Home Care Screening Complete - -  Medication Review (RN CM) Referral to Pharmacy - -  Some recent data might be hidden

## 2021-04-27 NOTE — Progress Notes (Signed)
Called for patient to come down for MRI exam.  Patient refused and stated that he did not want to come down at this time.  Refused medications from nurse.  Patient is continually refusing this exam and precluding other patients when we attempt him and he refuse.   A decision should be made on this patient.

## 2021-04-27 NOTE — Progress Notes (Signed)
[facility] Progress Note  Subjective: no c/o  Vitals:   04/26/21 1922 04/27/21 0004 04/27/21 0339 04/27/21 0722  BP: (!) 96/57 106/64 121/76 109/66  Pulse: 78 75 83 86  Resp: '15 16 18 20  '$ Temp: (!) 97.5 F (36.4 C) (!) 97.4 F (36.3 C) 98 F (36.7 C) 97.8 F (36.6 C)  TempSrc: Oral Oral Oral   SpO2: 100% 100% 99% 100%  Weight:      Height:        Exam:  alert, nad , obese, nasal O2  Lying flat, no ^wob  no jvd  Chest cta bilat  Cor reg no RG  Abd soft ntnd no ascites   Ext no LE edema   Alert, NF O x3  RIJ TDC      Home meds include - eliquis 5 bid, amiodarone, lanthanum 500 mg ac tid, midodrine '20mg'$  tid      OP HD: TTS/ AKI [facility]    4h  400/800    188.5kg   2/2 bath  TDC RIJ  Hep none   - no in center meds     Assessment/ Plan: MSSA bacteremia -likely catheter related bacteremia with positive blood cultures on 9/22 prompting removal of HD cath by IR on 9/24, +exit site purulence noted at removal. TDC replaced then by IR again on 9/28 after f/u cx's on 9/25 were negative.  Getting IV cefazolin.  Plans noted for TEE to assess for SBE. Fatigue/ multi-joint pain -suspect osteoarthritis with need to rule out septic arthritis with bacteremia.  Continue PT/OT. Shortness of breath/volume overload- admit issue, resolved. Reportedly pt's O2 equipment ran out or malfunctioned.  He continues to struggle with limiting IDWG.  Continue fluid restriction. ESRD - on HD TTS. Next HD tomorrow History of biventricular congestive heart failure-end-stage and not a candidate for assist device or transplant. Hypotension - on midodrine 20 tid Atrial fibrillation -currently appears to be in sinus rhythm and remains on/amiodarone CKD MBD-continue to reinforce renal diet along with phosphorus binder/adherence, elevated phosphorus level noted. Anemia of chronic kidney disease: Without overt blood loss, hemoglobin and hematocrit are improving on darbepoetin.       Rob Adrea Sherpa 04/27/2021, 10:39 AM   Recent Labs  Lab 04/24/21 0726 04/25/21 0808  K 5.0 5.5*  BUN 80* 55*  CREATININE 3.37* 3.47*  CALCIUM 9.7 9.9  PHOS 6.0* 6.8*  HGB 8.7* 9.7*   Inpatient medications:  allopurinol  100 mg Oral Daily   amiodarone  200 mg Oral Daily   apixaban  5 mg Oral BID   bisacodyl  10 mg Rectal Once   Chlorhexidine Gluconate Cloth  6 each Topical Daily   Chlorhexidine Gluconate Cloth  6 each Topical Q0600   darbepoetin (ARANESP) injection - DIALYSIS  150 mcg Intravenous Q Fri-HD   lanthanum  500 mg Oral TID WC   linaclotide  145 mcg Oral QAC breakfast   midodrine  20 mg Oral TID WC   polyethylene glycol  17 g Oral Once    sodium chloride Stopped (04/23/21 0830)   anticoagulant sodium citrate     anticoagulant sodium citrate      ceFAZolin (ANCEF) IV 1 g (04/26/21 1306)   acetaminophen **OR** acetaminophen, anticoagulant sodium citrate, cyclobenzaprine, ipratropium-albuterol, ondansetron **OR** ondansetron (ZOFRAN) IV, oxyCODONE, sodium chloride

## 2021-04-27 NOTE — Progress Notes (Signed)
Called on pt for possibility of coming down for MRI this afternoon. Per RN, pt refused exam. Will attempt to call again at later time.

## 2021-04-27 NOTE — NC FL2 (Signed)
[place] LEVEL OF CARE SCREENING TOOL     IDENTIFICATION  Patient Name: Phillip Young Birthdate: 05/08/1986 Sex: male Admission Date (Current Location): 04/12/2021  [facility] and [place] Number:  Herbalist and Address:  [facility], [place] [address] [place], [place] [address]      Provider Number: [id number]  Attending Physician Name and Address:  Barb Merino, MD  Relative Name and Phone Number:  Jelan, Bowsher (Mother)   [phone]    Current Level of Care: Hospital Recommended Level of Care: [place] Prior Approval Number:    Date Approved/Denied:   PASRR Number: Passr Pending  Discharge Plan: SNF    Current Diagnoses: Patient Active Problem List   Diagnosis Date Noted   Acute on chronic respiratory failure with hypoxia ([place]) 04/21/2021   Medical non-compliance 04/21/2021   Hypotension 04/21/2021   MSSA bacteremia    ESRD (end stage renal disease) on dialysis ([place]) 04/16/2021   ESRD (end stage renal disease) ([place]) 04/12/2021   Paroxysmal atrial flutter ([place]) 04/12/2021   Acute respiratory failure with hypoxia ([place]) 04/12/2021   Hyponatremia 04/12/2021   Dyspnea    Cardiogenic shock ([name])    Atrial flutter with rapid ventricular response ([place]) 02/11/2021   Elevated troponin 02/11/2021   Chronic respiratory failure with hypoxia ([place]) 02/11/2021   CAP (community acquired pneumonia) [id number]   Chronic systolic CHF (congestive heart failure) ([name])    Hypokalemia    CHF (congestive heart failure) ([place])    Chronic cough    Acute on chronic systolic (congestive) heart failure ([place]) 02/26/2020   Morbid obesity ([place]) 02/26/2020   GERD without esophagitis 02/26/2020   Diuretic-induced hypokalemia 02/26/2020   OSA (obstructive sleep apnea) 02/26/2020   Hidradenitis suppurativa 02/26/2020   Prediabetes 02/26/2020    Orientation RESPIRATION BLADDER Height & Weight     Self, Time, Situation, Place   O2 (6L Nasal Cannula) Continent Weight: (!) 404 lb 12.2 oz (183.6 kg) Height:  '5\' 11"'$  (180.3 cm)  BEHAVIORAL SYMPTOMS/MOOD NEUROLOGICAL BOWEL NUTRITION STATUS      Continent Diet  AMBULATORY STATUS COMMUNICATION OF NEEDS Skin   Extensive Assist Verbally Normal                       Personal Care Assistance Level of Assistance  Bathing, Dressing, Feeding Bathing Assistance: Maximum assistance Feeding assistance: Independent Dressing Assistance: Maximum assistance     Functional Limitations Info  Sight, Hearing, Speech Sight Info: Adequate Hearing Info: Adequate Speech Info: Adequate    SPECIAL CARE FACTORS FREQUENCY  PT (By licensed PT), OT (By licensed OT)     PT Frequency: 5x a week OT Frequency: 5x a week            Contractures Contractures Info: Not present    Additional Factors Info  Code Status, Allergies Code Status Info: Full Allergies Info: Coreg (carvedilol), Heparin           Current Medications (04/27/2021):  This is the current hospital active medication list Current Facility-Administered Medications  Medication Dose Route Frequency Provider Last Rate Last Admin   0.9 %  sodium chloride infusion   Intravenous Continuous Isaiah Serge, NP   Stopped at 04/23/21 0830   acetaminophen (TYLENOL) tablet 650 mg  650 mg Oral Q6H PRN Etta Quill, DO       Or   acetaminophen (TYLENOL) suppository 650 mg  650 mg Rectal Q6H PRN Etta Quill, DO  allopurinol (ZYLOPRIM) tablet 100 mg  100 mg Oral Daily Barb Merino, MD   100 mg at 04/27/21 0857   amiodarone (PACERONE) tablet 200 mg  200 mg Oral Daily Jennette Kettle M, DO   200 mg at 04/27/21 [id number]   anticoagulant sodium citrate solution 10 mL  10 mL Intracatheter Once Corrie Mckusick, DO       anticoagulant sodium citrate solution 5 mL  5 mL Intracatheter PRN Roney Jaffe, MD   5 mL at 04/25/21 1142   apixaban (ELIQUIS) tablet 5 mg  5 mg Oral BID Etta Quill, DO   5 mg at 04/26/21 2106    bisacodyl (DULCOLAX) suppository 10 mg  10 mg Rectal Once Rise Patience, MD       ceFAZolin (ANCEF) IVPB 1 g/50 mL premix  1 g Intravenous Q24H Willette Cluster, RPH 100 mL/hr at 04/27/21 1506 1 g at 04/27/21 1506   Chlorhexidine Gluconate Cloth 2 % PADS 6 each  6 each Topical Daily Etta Quill, DO   6 each at 04/23/21 0902   Chlorhexidine Gluconate Cloth 2 % PADS 6 each  6 each Topical Q0600 Roney Jaffe, MD   6 each at 04/25/21 0405   cyclobenzaprine (FLEXERIL) tablet 5 mg  5 mg Oral TID PRN Barb Merino, MD   5 mg at 04/17/21 2039   Darbepoetin Alfa (ARANESP) injection 150 mcg  150 mcg Intravenous Q Corene Cornea, MD   150 mcg at 04/24/21 0836   HYDROmorphone (DILAUDID) injection 0.5 mg  0.5 mg Intravenous Q6H PRN Barb Merino, MD   0.5 mg at 04/27/21 1434   ipratropium-albuterol (DUONEB) 0.5-2.5 (3) MG/3ML nebulizer solution 3 mL  3 mL Nebulization Q6H PRN Etta Quill, DO       lanthanum Lucretia Kern) chewable tablet 500 mg  500 mg Oral TID WC Etta Quill, DO   500 mg at 04/27/21 0215   linaclotide (LINZESS) capsule 145 mcg  145 mcg Oral QAC breakfast Elie Confer B, NP   145 mcg at 04/27/21 0857   midodrine (PROAMATINE) tablet 20 mg  20 mg Oral TID WC Jennette Kettle M, DO   20 mg at 04/27/21 0856   ondansetron (ZOFRAN) tablet 4 mg  4 mg Oral Q6H PRN Etta Quill, DO       Or   ondansetron [facility]) injection 4 mg  4 mg Intravenous Q6H PRN Etta Quill, DO   4 mg at 04/27/21 0354   oxyCODONE (Oxy IR/ROXICODONE) immediate release tablet 15 mg  15 mg Oral Q6H PRN Elmarie Shiley, MD   15 mg at 04/27/21 0857   polyethylene glycol (MIRALAX / GLYCOLAX) packet 17 g  17 g Oral Once Rise Patience, MD       sodium chloride (OCEAN) 0.65 % nasal spray 1 spray  1 spray Each Nare PRN Barb Merino, MD         Discharge Medications: Please see discharge summary for a list of discharge medications.  Relevant Imaging Results:  Relevant Lab  Results:   Additional Information SSN: [id number]; Pt not vaccinated  Reece Agar, LCSWA

## 2021-04-27 NOTE — Progress Notes (Signed)
OT Cancellation Note  Patient Details Name: Phillip Young MRN: [id number] DOB: 1986/07/19   Cancelled Treatment:    Reason Eval/Treat Not Completed: Fatigue/lethargy limiting ability to participate.  Attempted to see patient and he stated he was moved around a lot this morning and did not want to do more. Patient was also falling asleep during conversation. Nursing also states they moved patient to new bed this morning.  Lodema Hong, OTA  Doren Kaspar Alexis Goodell 04/27/2021, 11:48 AM

## 2021-04-27 NOTE — Progress Notes (Signed)
Pt. Is verbally abusive when trying to move in the  bariatric bed due to pain on both legs. Pain meds given, still don't know  how to position his legs in bed.  More restless when trying to help, claiming that no one is helping him.  Pt. Attempted to call 911 to come  and help him. Explained that is necessary. 2c director came to see him and talked to him and also help in moving in the bariatric bed with the use of hoyer lift with 5 person assist. Claimed that the bed is not comfortable. Kept  him comfortable to the best we could do.  Went to sleep at 12 noon. Continue to monitor.

## 2021-04-28 DIAGNOSIS — R7881 Bacteremia: Secondary | ICD-10-CM | POA: Diagnosis not present

## 2021-04-28 DIAGNOSIS — I509 Heart failure, unspecified: Secondary | ICD-10-CM | POA: Diagnosis not present

## 2021-04-28 DIAGNOSIS — Z91199 Patient's noncompliance with other medical treatment and regimen due to unspecified reason: Secondary | ICD-10-CM

## 2021-04-28 DIAGNOSIS — J9611 Chronic respiratory failure with hypoxia: Secondary | ICD-10-CM | POA: Diagnosis not present

## 2021-04-28 DIAGNOSIS — B9561 Methicillin susceptible Staphylococcus aureus infection as the cause of diseases classified elsewhere: Secondary | ICD-10-CM | POA: Diagnosis not present

## 2021-04-28 DIAGNOSIS — J9621 Acute and chronic respiratory failure with hypoxia: Secondary | ICD-10-CM | POA: Diagnosis not present

## 2021-04-28 DIAGNOSIS — N186 End stage renal disease: Secondary | ICD-10-CM | POA: Diagnosis not present

## 2021-04-28 DIAGNOSIS — R52 Pain, unspecified: Secondary | ICD-10-CM

## 2021-04-28 LAB — RENAL FUNCTION PANEL
Albumin: 2.7 g/dL — ABNORMAL LOW (ref 3.5–5.0)
Anion gap: 9 (ref 5–15)
BUN: 57 mg/dL — ABNORMAL HIGH (ref 6–20)
CO2: 27 mmol/L (ref 22–32)
Calcium: 9.8 mg/dL (ref 8.9–10.3)
Chloride: 93 mmol/L — ABNORMAL LOW (ref 98–111)
Creatinine, Ser: 3.14 mg/dL — ABNORMAL HIGH (ref 0.61–1.24)
GFR, Estimated: 25 mL/min — ABNORMAL LOW (ref >60)
Glucose, Bld: 88 mg/dL (ref 70–99)
Phosphorus: 6.7 mg/dL — ABNORMAL HIGH (ref 2.5–4.6)
Potassium: 5 mmol/L (ref 3.5–5.1)
Sodium: 129 mmol/L — ABNORMAL LOW (ref 135–145)

## 2021-04-28 LAB — CBC
HCT: 31.6 % — ABNORMAL LOW (ref 39.0–52.0)
Hemoglobin: 10.1 g/dL — ABNORMAL LOW (ref 13.0–17.0)
MCH: 30.8 pg (ref 26.0–34.0)
MCHC: 32 g/dL (ref 30.0–36.0)
MCV: 96.3 fL (ref 80.0–100.0)
Platelets: 486 10*3/uL — ABNORMAL HIGH (ref 150–400)
RBC: 3.28 MIL/uL — ABNORMAL LOW (ref 4.22–5.81)
RDW: 17.8 % — ABNORMAL HIGH (ref 11.5–15.5)
WBC: 9.4 10*3/uL (ref 4.0–10.5)
nRBC: 0 % (ref 0.0–0.2)

## 2021-04-28 MED ORDER — POLYETHYLENE GLYCOL 3350 17 G PO PACK: 17.0000 g | PACK | Freq: Every day | ORAL | Status: DC | PRN

## 2021-04-28 MED ORDER — HYDROMORPHONE HCL 1 MG/ML IJ SOLN: 0.5000 mg | Freq: Two times a day (BID) | INTRAMUSCULAR | Status: DC | PRN

## 2021-04-28 MED ORDER — HYDROMORPHONE HCL 1 MG/ML IJ SOLN
0.5000 mg | Freq: Four times a day (QID) | INTRAMUSCULAR | Status: DC | PRN
  Administered 2021-04-28 – 2021-05-07 (×17): 0.5 mg via INTRAVENOUS
  Filled 2021-04-28 (×19): qty 0.5

## 2021-04-28 NOTE — Progress Notes (Signed)
BIPAP on standby at bedside with 6L O2 bled into circuit. Pt stated he would place himself on BIPAP when he is ready. RT instructed pt to have RT called if assistance is needed. RT will monitor as needed.

## 2021-04-28 NOTE — Progress Notes (Signed)
I have repeatedly asked patient to leave O2 nasal cannula on, sats at 94% at rest on 5L, I have replaced nasal cannula repeatedly and upon reentering room several times he has pulled it off and states he does not want it

## 2021-04-28 NOTE — Progress Notes (Signed)
Chart reviewed. Appears pt for possible snf placement. Pt receives out-pt HD at [facility] on TTS. Pt needs to arrive at 5:30 for 5:45 chair time. Will provide info to CSW.    Melven Sartorius Renal Navigator  [phone]

## 2021-04-28 NOTE — Progress Notes (Signed)
[facility] Progress Note  Subjective: no c/o, refused HD today, says that when the bed is deflated for transporting to HD, then he gets back pain / buttock pain that doesn't get better when the bed is re-inflated in HD.   Vitals:   04/28/21 0200 04/28/21 0300 04/28/21 0331 04/28/21 0851  BP: 105/67 109/70  107/69  Pulse:    80  Resp: 16 20 (!) 21 17  Temp:   97.6 F (36.4 C) 97.8 F (36.6 C)  TempSrc:   Oral Oral  SpO2:    98%  Weight:      Height:        Exam:  alert, nad , obese, nasal O2  Lying flat, no ^wob  no jvd  Chest cta bilat  Cor reg no RG  Abd soft ntnd no ascites   Ext no LE edema   Alert, NF O x3  RIJ TDC      Home meds include - eliquis 5 bid, amiodarone, lanthanum 500 mg ac tid, midodrine '20mg'$  tid      OP HD: TTS/ AKI [facility]    4h  400/800    188.5kg   2/2 bath  TDC RIJ  Hep none   - no in center meds     Assessment/ Plan: MSSA bacteremia -likely catheter related bacteremia with positive blood cultures on 9/22 prompting removal of HD cath by IR on 9/24, +exit site purulence noted at removal. TDC replaced then by IR again on 9/28 after f/u cx's on 9/25 were negative.  Getting IV cefazolin.  Fatigue/ multi-joint pain -suspect osteoarthritis. MRI did not happen. Continue PT/OT. Shortness of breath/volume overload- admit issue, resolved. Reportedly pt's O2 equipment ran out or malfunctioned.  He continues to struggle with limiting IDWG.  Continue fluid restriction. ESRD - on HD TTS. HD today.  History of biventricular congestive heart failure-end-stage and not a candidate for assist device or transplant. Hypotension - on midodrine 20 tid Atrial fibrillation -currently appears to be in sinus rhythm and remains on/amiodarone CKD MBD-continue to reinforce renal diet along with phosphorus binder/adherence, elevated phosphorus level noted. Anemia of chronic kidney disease: Without overt blood loss, hemoglobin and hematocrit are improving  on darbepoetin.      Rob Jasmene Goswami 04/28/2021, 11:18 AM   Recent Labs  Lab 04/25/21 0808 04/28/21 0631  K 5.5* 5.0  BUN 55* 57*  CREATININE 3.47* 3.14*  CALCIUM 9.9 9.8  PHOS 6.8* 6.7*  HGB 9.7* 10.1*    Inpatient medications:  allopurinol  100 mg Oral Daily   amiodarone  200 mg Oral Daily   apixaban  5 mg Oral BID   bisacodyl  10 mg Rectal Once   Chlorhexidine Gluconate Cloth  6 each Topical Daily   Chlorhexidine Gluconate Cloth  6 each Topical Q0600   darbepoetin (ARANESP) injection - DIALYSIS  150 mcg Intravenous Q Fri-HD   lanthanum  500 mg Oral TID WC   linaclotide  145 mcg Oral QAC breakfast   LORazepam  1 mg Intravenous Once   midodrine  20 mg Oral TID WC   polyethylene glycol  17 g Oral Once    sodium chloride Stopped (04/23/21 0830)   anticoagulant sodium citrate     anticoagulant sodium citrate      ceFAZolin (ANCEF) IV 1 g (04/27/21 1506)   acetaminophen **OR** acetaminophen, anticoagulant sodium citrate, cyclobenzaprine, HYDROmorphone (DILAUDID) injection, ipratropium-albuterol, ondansetron **OR** ondansetron (ZOFRAN) IV, oxyCODONE, sodium chloride

## 2021-04-28 NOTE — Progress Notes (Signed)
    [facility] for Infectious Disease   Reason for visit: Follow up on bacteremia  Interval History: refused MRI of his hip, complaining of hip pain, refused TEE.  Refused HD today.  No rash Day 12 cefazolin Blood culture 04/19/21 negative at 5 days x 2 MSSA on 9/22  Physical Exam: Constitutional:  Vitals:   04/28/21 0331 04/28/21 0851  BP:  107/69  Pulse:  80  Resp: (!) 21 17  Temp: 97.6 F (36.4 C) 97.8 F (36.6 C)  SpO2:  98%   patient appears in NAD Respiratory: Normal respiratory effort; CTA B Cardiovascular: RRR GI: soft, nt, nd  Review of Systems: Constitutional: negative for fevers and chills Gastrointestinal: negative for nausea and diarrhea  Lab Results  Component Value Date   WBC 9.4 04/28/2021   HGB 10.1 (L) 04/28/2021   HCT 31.6 (L) 04/28/2021   MCV 96.3 04/28/2021   PLT 486 (H) 04/28/2021    Lab Results  Component Value Date   CREATININE 3.14 (H) 04/28/2021   BUN 57 (H) 04/28/2021   NA 129 (L) 04/28/2021   K 5.0 04/28/2021   CL 93 (L) 04/28/2021   CO2 27 04/28/2021    Lab Results  Component Value Date   ALT 32 04/12/2021   AST 36 04/12/2021   ALKPHOS 84 04/12/2021     Microbiology: Recent Results (from the past 240 hour(s))  Culture, blood (Routine X 2) w Reflex to ID Panel     Status: None   Collection Time: 04/19/21 11:32 AM   Specimen: BLOOD RIGHT HAND  Result Value Ref Range Status   Specimen Description BLOOD RIGHT HAND  Final   Special Requests   Final    BOTTLES DRAWN AEROBIC AND ANAEROBIC Blood Culture adequate volume   Culture   Final    NO GROWTH 5 DAYS Performed at [facility], [address], [place], [place] [address]    Report Status 04/24/2021 FINAL  Final  Culture, blood (Routine X 2) w Reflex to ID Panel     Status: None   Collection Time: 04/19/21 11:37 AM   Specimen: BLOOD LEFT HAND  Result Value Ref Range Status   Specimen Description BLOOD LEFT HAND  Final   Special Requests   Final    BOTTLES  DRAWN AEROBIC AND ANAEROBIC Blood Culture adequate volume   Culture   Final    NO GROWTH 5 DAYS Performed at [facility], [place] [address] [place], [place] [address]    Report Status 04/24/2021 FINAL  Final    Impression/Plan:  1. MSSA bacteremia - repeat cultures 9/25 ngtd.  TTE without vegettation noted.  Did refuse the TEE.  With no TEE, I recommend he continue with cefazollin with dialysis for 6 weeks through November 5th  2.  Hip pain - likely arthritis but always a concern for a seeding of MSSA.  He though unfortunately has refused an MRI so unable to deteremine.  Will treat for 6 weeks as above and if pain persists can consider an MRI at a later point if he wants at that time.    3.  ESRD - refused hemodialysis today and periodically refuses.  Has a new line after a line holiday for his infection as above.    No further studies pending and plan as above, I will sign off, call with any changes.

## 2021-04-28 NOTE — Progress Notes (Signed)
Daily Progress Note   Patient Name: Phillip Young       Date: 04/28/2021 DOB: 09-23-1985  Age: 35 y.o. MRN#: [id number] Attending Physician: Barb Merino, MD Primary Care Physician: [name], MD Admit Date: 04/12/2021  Reason for Consultation/Follow-up: Establishing goals of care  Subjective: Chart review performed.  Received report from primary RN -no acute concerns.  Went to visit patient at bedside -no family/visitors present.  Patient is lying in bed he is awake, alert, oriented, and able to participate in conversation. No respiratory distress, increased work of breathing, or secretions noted.  He does endorse a 10 out of 10 pain in his hip and 4 out of 10 pain in his knee -RN notified -pain medication was given during my visit.  Met with patient to continue goals of care.  Reviewed patient's statement for desire for hospice services 2 days ago.  Patient states "sometimes when I am in so much pain I say things."  Patient tells me today that he does not desire hospice services and does want to continue to try and prolong his life.  Natural disease trajectory for end-stage CHF and ESRD were reviewed.  Education provided that CHF and CKD are progressive, noncurable diseases.  We also discussed his pain management in context of full scope care versus pain management in context of full comfort care.  Therapeutic listening provided as patient discusses why he refused dialysis.  I directly informed him that by refusing recommended medical interventions, this itself if life limiting - he expressed understanding.  Patient's goal is to discharge and hopefully gain benefit at rehab facility -we reviewed that he would have to be agreeable to recommended medical interventions in-house, which would  help him achieve this goal.  Reviewed that if he is able to make it to discharge he remains at high risk for rehospitalization due to his end-stage chronic diseases.  We discussed code status at length and medical recommendation was given for DNR/DNI -he wishes to continue full code at this time.    Patient states he will be agreeable to HD and other recommended interventions going forward to meet his goal for discharge to rehab.  We discuss patient's nausea today - he tells me his last BM was "two weeks ago" - no BM noted in flowsheet. He requests laxative and is agreeable  to miralax.   All questions and concerns addressed. Encouraged to call with questions and/or concerns. PMT card provided.   Length of Stay: 12  Current Medications: Scheduled Meds:  . allopurinol  100 mg Oral Daily  . amiodarone  200 mg Oral Daily  . apixaban  5 mg Oral BID  . bisacodyl  10 mg Rectal Once  . Chlorhexidine Gluconate Cloth  6 each Topical Daily  . Chlorhexidine Gluconate Cloth  6 each Topical Q0600  . darbepoetin (ARANESP) injection - DIALYSIS  150 mcg Intravenous Q Fri-HD  . lanthanum  500 mg Oral TID WC  . linaclotide  145 mcg Oral QAC breakfast  . LORazepam  1 mg Intravenous Once  . midodrine  20 mg Oral TID WC  . polyethylene glycol  17 g Oral Once    Continuous Infusions: . sodium chloride Stopped (04/23/21 0830)  . anticoagulant sodium citrate    . anticoagulant sodium citrate    .  ceFAZolin (ANCEF) IV 1 g (04/28/21 1504)    PRN Meds: acetaminophen **OR** acetaminophen, anticoagulant sodium citrate, cyclobenzaprine, HYDROmorphone (DILAUDID) injection, ipratropium-albuterol, ondansetron **OR** ondansetron (ZOFRAN) IV, oxyCODONE, sodium chloride  Physical Exam Vitals and nursing note reviewed.  Constitutional:      General: He is not in acute distress.    Appearance: He is morbidly obese.  Pulmonary:     Effort: No respiratory distress.  Skin:    General: Skin is warm and dry.   Neurological:     Mental Status: He is alert and oriented to person, place, and time.     Motor: Weakness present.  Psychiatric:        Attention and Perception: Attention normal.        Behavior: Behavior is cooperative.        Cognition and Memory: Cognition and memory normal.            Vital Signs: BP 118/68 (BP Location: Right Wrist)   Pulse 84   Temp 98 F (36.7 C) (Oral)   Resp 16   Ht _0  (1.803 m)   Wt (!) 183.6 kg   SpO2 98%   BMI 56.45 kg/m  SpO2: SpO2: 98 % O2 Device: O2 Device: Nasal Cannula O2 Flow Rate: O2 Flow Rate (L/min): 5 L/min  Intake/output summary:  Intake/Output Summary (Last 24 hours) at 04/28/2021 1722 Last data filed at 04/28/2021 1504 Gross per 24 hour  Intake 582 ml  Output 950 ml  Net -368 ml   LBM: Last BM Date: 04/12/21 Baseline Weight: Weight: (!) 189.4 kg Most recent weight: Weight: (!) 183.6 kg       Palliative Assessment/Data: PPS 30-40%      Patient Active Problem List   Diagnosis Date Noted  . ESRD on dialysis ([place])   . Palliative care by specialist   . Full code status   . Concern about end of life   . Acute on chronic respiratory failure with hypoxia ([place]) 04/21/2021  . Medical non-compliance 04/21/2021  . Hypotension 04/21/2021  . MSSA bacteremia   . ESRD (end stage renal disease) on dialysis ([place]) 04/16/2021  . ESRD (end stage renal disease) ([place]) 04/12/2021  . Paroxysmal atrial flutter ([place]) 04/12/2021  . Acute respiratory failure with hypoxia ([place]) 04/12/2021  . Hyponatremia 04/12/2021  . Dyspnea   . Cardiogenic shock ([place])   . Atrial flutter with rapid ventricular response ([place]) 02/11/2021  . Elevated troponin 02/11/2021  . Chronic respiratory failure with hypoxia ([place]) 02/11/2021  . CAP (community  acquired pneumonia) 10/08/2020  . Chronic systolic CHF (congestive heart failure) ([place])   . Hypokalemia   . CHF (congestive heart failure) ([place])   . Chronic cough   . Acute on chronic systolic (congestive)  heart failure ([place]) 02/26/2020  . Morbid obesity ([place]) 02/26/2020  . GERD without esophagitis 02/26/2020  . Diuretic-induced hypokalemia 02/26/2020  . OSA (obstructive sleep apnea) 02/26/2020  . Hidradenitis suppurativa 02/26/2020  . Prediabetes 02/26/2020    Palliative Care Assessment & Plan   Patient Profile: 35 y.o. male  with past medical history of severe biventricular CHF with EF < 20%, DM type 2, paroxysmal atrial flutter on Eliquis, OSA, morbid obesity, and ESRD on HD. He presented to the emergency department on 04/12/2021 with shortness of breath for the past 5-6 days. He noted his O2 saturation to be 78% at home, and was questioning whether his home O2 concentrator was working properly. O2 saturation improved as soon as EMS placed him on oxygen. In the ED, chest x-ray showed pulmonary edema. BNP 658. He was [facility] to [facility] with chronic respiratory failure.  He was also found to have MSSA bacteremia, likely from HD catheter which was subsequently removed.    Patient was hospitalized 02/11/21 - 03/26/21 with cardiogenic shock and cardiorenal syndrome with progressed to ESRD. He initially required CRRT, but was discharged on TTS dialysis.    Assessment: MSSA bacteremia Chronic systolic CHF, fluid overload ESRD on HD Acute on chronic respiratory failure with hypoxia OSA Multi-joint pain Constipation  Recommendations/Plan: Continue full scope medical treatment Continue full code status Patient is not agreeable to hospice or full comfort measures today Patient understands by refusing recommended medical treatment, this is life limiting, which does not meet his goal to prolong life. He states he will be agreeable to HD, MRI, and other recommended medical interventions/treatments going forward Patient's goal is for discharge to rehab facility - would recommend outpatient Palliative Care to follow Ordered Miralax daily PRN. Continue Linzess daily PMT will continue to follow  intermittently. If there are any imminent needs please call the service directly  Goals of Care and Additional Recommendations: Limitations on Scope of Treatment: Full Scope Treatment  Code Status:    Code Status Orders  (From admission, onward)           Start     Ordered   04/12/21 2154  Full code  Continuous        04/12/21 2154           Code Status History     Date Active Date Inactive Code Status Order ID Comments User Context   02/11/2021 1849 03/26/2021 1740 Full Code [id number]  Orma Flaming, MD ED   10/08/2020 1424 10/16/2020 2059 Full Code [id number]  Asencion Noble, MD ED   02/26/2020 2340 03/11/2020 2330 Full Code [id number]  Shalhoub, Sherryll Burger, MD ED       Prognosis: Poor in the setting of recurrent hospitalizations, multiple end stage diseases, and multiple comorbidities (especially if he continues to refuse treatment)  Discharge Planning: To Be Determined  Care plan was discussed with patient, primary RN  Thank you for allowing the Palliative Medicine Team to assist in the care of this patient.   Total Time 70 minutes Prolonged Time Billed  yes       Greater than 50%  of this time was spent counseling and coordinating care related to the above assessment and plan.  Lin Landsman, NP  Please contact Palliative Medicine Team phone at  [phone] for questions and concerns.

## 2021-04-28 NOTE — Progress Notes (Signed)
Hemodialysis- Patient brought to unit. Refusing dialysis due to discomfort with air mattress. Discussed importance of HD treatment with patient. Patient tells me "once this mattress deflates and my butt hits the bottom, Im in pain and theres nothing you can do about it. I dont want to put myself at risk on dialysis." Attempted to reason with patient. Notified K. Stovall PA and she also attempted to reason with patient. Still refusing. Will send back to room and notify primary RN.

## 2021-04-28 NOTE — Progress Notes (Signed)
Transported up to HD unit for dialysis today, then refused to be stuck due to bed causing back pain. Attempted to reason with him for over ten minutes, various excuses - no success to convince him to stay for dialysis -> will have him transported back to his room without treatment and circle back to him at the end of the day.  Veneta Penton, PA-C [employer] Pager [phone]

## 2021-04-28 NOTE — Progress Notes (Addendum)
Antimicrobial Stewardship Note:  Will be receiving antibiotics with dialysis at dialysis center once discharged.   Antibiotic:Cefazolin 2 gm with each HD TTHSa Indication: MSSA Bacteremia End Date: 05/30/21  Please fax labs to [phone]  He currently is not on a consistent dialysis schedule. If he does does go on a consistent schedule, will adjust dose.  Lestine Box, PharmD PGY2 Infectious Diseases Pharmacy Resident   Please check [email or web address] for unit-specific pharmacy phone numbers  Jimmy Footman, PharmD, BCPS, [place] Infectious Diseases Clinical Pharmacist Phone: [phone]

## 2021-04-28 NOTE — Progress Notes (Signed)
Currently PROGRESS NOTE    Phillip Young  [id number] DOB: 04-04-1986 DOA: 04/12/2021 PCP: [name], MD    Brief Narrative:  35 year old gentleman with severe biventricular systolic heart failure with known ejection fraction less than 20%, type 2 diabetes, paroxysmal A-flutter on Eliquis and  recently started on hemodialysis with recent extensive hospitalization with cardiogenic shock and cardiorenal syndrome progressed to ESRD presented back to the emergency room with shortness of breath, left wrist and right knee hurting.  Unable to ambulate around.  Home oxygen concentrator not working. At the emergency room Chest x-ray with pulmonary edema mostly chronic.  COVID-19 and flu negative.  Unable to ambulate.  Continues to complain of pain.  He was found to have MSSA bacteremia due to infected dialysis catheter. ID and nephrology consulted. After catheter holiday, he received new permacath on 9/28 and restarted on hemodialysis. Continues to complain of pain mostly on the left hip, right knee. Subsequent CT scans with no evidence of joint fluid or collections.  Pain issues remain consistent. Patient agrees with test with this provider, when we have resource available to take him for test like MRI then he refuses the test.  He has been in and out of declining treatment since last 1 week.  Very difficult situation because of declining test when manpower available to help him transfer. 10/4, again declined to go for dialysis saying that his left buttock hurts.   Assessment & Plan:   Principal Problem:   MSSA bacteremia Active Problems:   Morbid obesity ([place])   GERD without esophagitis   OSA (obstructive sleep apnea)   Chronic systolic CHF (congestive heart failure) ([name])   Paroxysmal atrial flutter ([name])   Hyponatremia   ESRD (end stage renal disease) on dialysis ([place])   Acute on chronic respiratory failure with hypoxia ([name])   Medical non-compliance   Hypotension   ESRD on  dialysis [facility])   Palliative care by specialist   Full code status   Concern about end of life  MSSA bacteremia: Arthralgias.  Possible septic arthritis but unable to prove.   CT left hip 9/25, destructive changes. CT scan right knee 9/28, no acute findings. 4 out of 4 bottles positive for MSSA on 9/22.  Echocardiogram transthoracic with no evidence of vegetation. Permacath removed, repeat blood cultures negative so far.  ID consulted.  Currently remains on cefazolin. Patient does have diffuse arthralgia, unable to prove septic joints.  Unable to do a physical exam but no obvious swelling or restricted movement. Was agreeable to TEE and then declined, cardiology holding off on TEE until he gets few more sessions of dialysis.  I am not sure when this is going to happen. He was agreeable to do MRI with me, we discussed about doing MRI along with some Ativan and injection Dilaudid, multiple support personnel were available in the evening 10/3, he simply declined it. Difficult to do physical exam on his joints, however no obvious evidence of swelling or effusion. On oxycodone, Flexeril.  Was given a short course of prednisone. With complaints of severe pain, will use Dilaudid 0.5 mg every 12 hours mostly to help with his mobility and during procedures to prevent from pain.  Acute on chronic respiratory failure with hypoxemia and hypercarbia: Due to malfunctioning concentrator at home and now with fluid overload. Oxygen requirement is improving.  Anywhere between 4 to 8 L of oxygen.  Using BiPAP at night without interruption.  Hopefully his oxygenation will improve with ongoing hemodialysis.   ESRD and  hemodialysis: Received new permacath.  Scheduled for dialysis, declines most of the time.  Then when  palliative care talks to him, he wants to be treated and wants to be full code.  Biventricular heart failure/paroxysmal atrial fibrillation/hypotension: Stable.  Known EF of less than 20%.  Followed  by heart failure team.  Intolerance to advanced heart failure therapies. Midodrine 20 mg 3 times a day.  Fluid management with dialysis.  Not a candidate for ICD as per heart failure team. Currently in sinus rhythm on amiodarone.  Therapeutic on Eliquis. Challenging to tolerate hemodialysis with EF less than 20%, however so far he is able to tolerate dialysis with occasional difficulties.    Morbid obesity: BMI more than 50.  on semaglutide.   Very difficult situation.  Most of the time declines procedure. Will try to convince him for hemodialysis and MRI of the left hip today.    DVT prophylaxis:  apixaban (ELIQUIS) tablet 5 mg   Code Status: Full code Family Communication: Sister at the bedside. Disposition Plan: Status is: Inpatient.   Dispo: The patient is from: Home              Anticipated d/c is to: SNF versus home with home health.              Patient currently is not medically stable.   Difficult to place patient No         Consultants:  Cardiology Nephrology Infectious disease Palliative care  Procedures:  None  Antimicrobials:  Cefazolin   Subjective: Patient seen and examined.  Early morning rounds, he did tell me his left hip is hurting but Dilaudid helped. He declined to go to MRI last evening as documented by radiology technician x2, he tells me he does not remember someone asking him to go to MRI. He was agreeable to go to dialysis and MRI then suddenly changed his mind.  Objective: Vitals:   04/28/21 0200 04/28/21 0300 04/28/21 0331 04/28/21 0851  BP: 105/67 109/70  107/69  Pulse:    80  Resp: 16 20 (!) 21 17  Temp:   97.6 F (36.4 C) 97.8 F (36.6 C)  TempSrc:   Oral Oral  SpO2:    98%  Weight:      Height:        Intake/Output Summary (Last 24 hours) at 04/28/2021 1138 Last data filed at 04/28/2021 0900 Gross per 24 hour  Intake 582 ml  Output 550 ml  Net 32 ml   Filed Weights   04/24/21 1106 04/25/21 0853 04/25/21 1141   Weight: (!) 183.7 kg (!) 184.9 kg (!) 183.6 kg    Examination:  General: Anxious, mild distress due to pain on the left hip. Cardiovascular: S1-S2 normal.  Regular rate rhythm. Respiratory: No added sounds.  Bilateral clear. SpO2: 98 % O2 Flow Rate (L/min): 6 L/min FiO2 (%): (!) 6 %  Gastrointestinal: Soft.  Pendulous.  Nontender. Ext: No joint swelling or effusion.  Mobility is full range of motion. Left wrist joint is without any tenderness and without any restricted range of motion. Left hip joint is difficult to assess, no obvious restricted range of motion, patient complained of posterior hip pain. Right knee with some suprapatellar tenderness, no joint effusion or patellar tap present.   Data Reviewed: I have personally reviewed following labs and imaging studies  CBC: Recent Labs  Lab 04/22/21 0641 04/24/21 0726 04/25/21 0808 04/28/21 0631  WBC 14.7* 11.3* 10.6* 9.4  HGB 9.0* 8.7* 9.7*  10.1*  HCT 28.8* 28.3* 31.5* 31.6*  MCV 96.6 101.1* 98.4 96.3  PLT 374 404* 447* [id number]*   Basic Metabolic Panel: Recent Labs  Lab 04/22/21 0043 04/22/21 1040 04/23/21 0648 04/24/21 0726 04/25/21 0808 04/28/21 0631  NA 128*  --  132* 132* 129* 129*  K 6.6* 6.2* 4.9 5.0 5.5* 5.0  CL 91*  --  93* 92* 92* 93*  CO2 24  --  '26 27 26 27  '$ GLUCOSE 165*  --  103* 139* 99 88  BUN 93*  --  61* 80* 55* 57*  CREATININE 5.06*  --  3.15* 3.37* 3.47* 3.14*  CALCIUM 10.0  --  9.8 9.7 9.9 9.8  PHOS 9.3*  --   --  6.0* 6.8* 6.7*   GFR: Estimated Creatinine Clearance: 54.6 mL/min (A) (by C-G formula based on SCr of 3.14 mg/dL (H)). Liver Function Tests: Recent Labs  Lab 04/22/21 0043 04/24/21 0726 04/25/21 0808 04/28/21 0631  ALBUMIN 2.6* 2.6* 2.8* 2.7*   No results for input(s): LIPASE, AMYLASE in the last 168 hours.  No results for input(s): AMMONIA in the last 168 hours. Coagulation Profile: Recent Labs  Lab 04/23/21 0648  INR 1.3*   Cardiac Enzymes: No results for  input(s): CKTOTAL, CKMB, CKMBINDEX, TROPONINI in the last 168 hours. BNP (last 3 results) No results for input(s): PROBNP in the last 8760 hours. HbA1C: No results for input(s): HGBA1C in the last 72 hours. CBG: No results for input(s): GLUCAP in the last 168 hours. Lipid Profile: No results for input(s): CHOL, HDL, LDLCALC, TRIG, CHOLHDL, LDLDIRECT in the last 72 hours. Thyroid Function Tests: No results for input(s): TSH, T4TOTAL, FREET4, T3FREE, THYROIDAB in the last 72 hours. Anemia Panel: No results for input(s): VITAMINB12, FOLATE, FERRITIN, TIBC, IRON, RETICCTPCT in the last 72 hours. Sepsis Labs: No results for input(s): PROCALCITON, LATICACIDVEN in the last 168 hours.  Recent Results (from the past 240 hour(s))  Culture, blood (Routine X 2) w Reflex to ID Panel     Status: None   Collection Time: 04/19/21 11:32 AM   Specimen: BLOOD RIGHT HAND  Result Value Ref Range Status   Specimen Description BLOOD RIGHT HAND  Final   Special Requests   Final    BOTTLES DRAWN AEROBIC AND ANAEROBIC Blood Culture adequate volume   Culture   Final    NO GROWTH 5 DAYS Performed at [facility], [address], [place], [place] [address]    Report Status 04/24/2021 FINAL  Final  Culture, blood (Routine X 2) w Reflex to ID Panel     Status: None   Collection Time: 04/19/21 11:37 AM   Specimen: BLOOD LEFT HAND  Result Value Ref Range Status   Specimen Description BLOOD LEFT HAND  Final   Special Requests   Final    BOTTLES DRAWN AEROBIC AND ANAEROBIC Blood Culture adequate volume   Culture   Final    NO GROWTH 5 DAYS Performed at [facility], [place] [address] [place], [place] [address]    Report Status 04/24/2021 FINAL  Final         Radiology Studies: No results found.      Scheduled Meds:  allopurinol  100 mg Oral Daily   amiodarone  200 mg Oral Daily   apixaban  5 mg Oral BID   bisacodyl  10 mg Rectal Once   Chlorhexidine Gluconate Cloth  6 each Topical  Daily   Chlorhexidine Gluconate Cloth  6 each Topical  Q0600   darbepoetin (ARANESP) injection - DIALYSIS  150 mcg Intravenous Q Fri-HD   lanthanum  500 mg Oral TID WC   linaclotide  145 mcg Oral QAC breakfast   LORazepam  1 mg Intravenous Once   midodrine  20 mg Oral TID WC   polyethylene glycol  17 g Oral Once   Continuous Infusions:  sodium chloride Stopped (04/23/21 0830)   anticoagulant sodium citrate     anticoagulant sodium citrate      ceFAZolin (ANCEF) IV 1 g (04/27/21 1506)     LOS: 12 days    Time spent: 30 minutes    Barb Merino, MD [facility] Pager [phone]

## 2021-04-28 NOTE — Progress Notes (Signed)
PT Cancellation Note  Patient Details Name: Phillip Young MRN: [id number] DOB: 1985/11/04   Cancelled Treatment:    Reason Eval/Treat Not Completed: Patient declined participation for multiple reasons, including, "I'm on heavy pain medication right now... I need to get stable." Noted patient taking nasal canula in/out of mouth; replaced on pt's nose due to significant hypoxia. Encouragement provided to participate, pt still declining. Will follow-up for PT treatment as schedule permits.  Mabeline Caras, PT, DPT Acute Rehabilitation Services  Pager [phone] Office [place] 04/28/2021, 10:34 AM

## 2021-04-29 ENCOUNTER — Inpatient Hospital Stay (HOSPITAL_COMMUNITY): Payer: BC Managed Care – PPO

## 2021-04-29 DIAGNOSIS — B9561 Methicillin susceptible Staphylococcus aureus infection as the cause of diseases classified elsewhere: Secondary | ICD-10-CM | POA: Diagnosis not present

## 2021-04-29 DIAGNOSIS — R7881 Bacteremia: Secondary | ICD-10-CM | POA: Diagnosis not present

## 2021-04-29 IMAGING — MR MR HIP*L* W/O CM
4 of 7 series · 19 of 40 positions shown · non-contrast
Comparison: CT scan [DATE]

CLINICAL DATA: Left hip pain.  MRSA bacteremia.

EXAM:
MR OF THE LEFT HIP WITHOUT CONTRAST
TECHNIQUE: Multiplanar, multisequence MR imaging was performed. No intravenous
contrast was administered.

[Series 2: T2 fat-sat · coronal · 4.0mm · 0.90mm/px · 6 of 54 slices shown (1 of 2)]
[im 1/54]
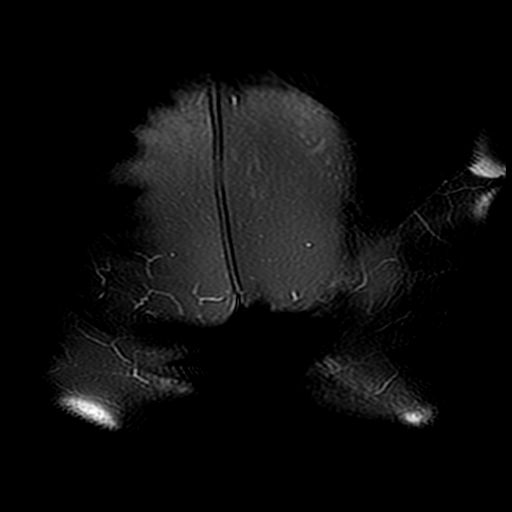
[im 11/54]
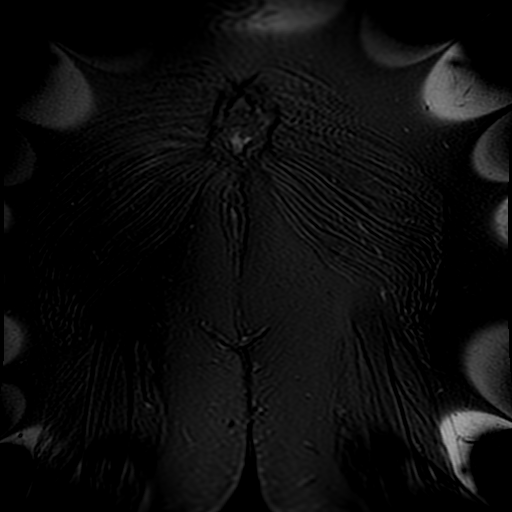
[im 22/54]
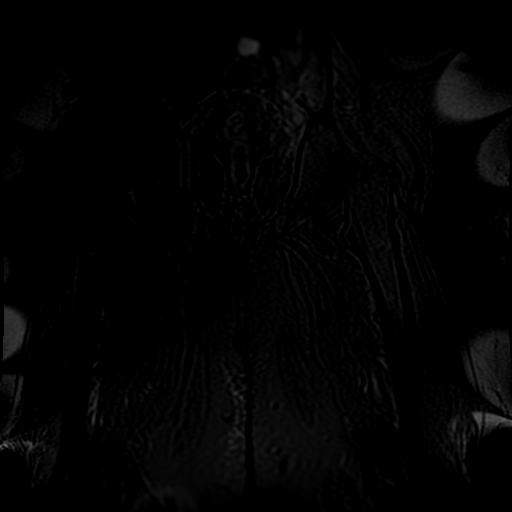
[im 32/54]
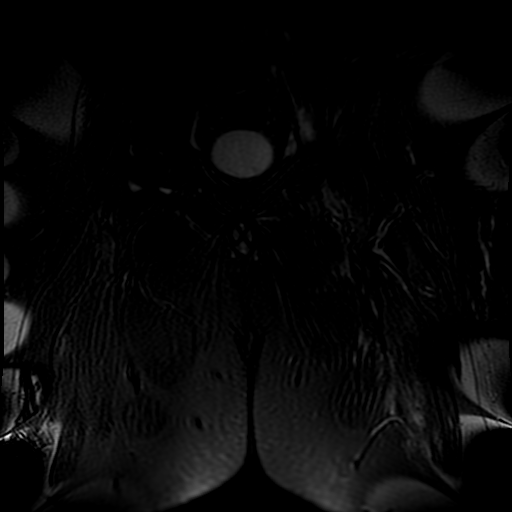
[im 43/54]
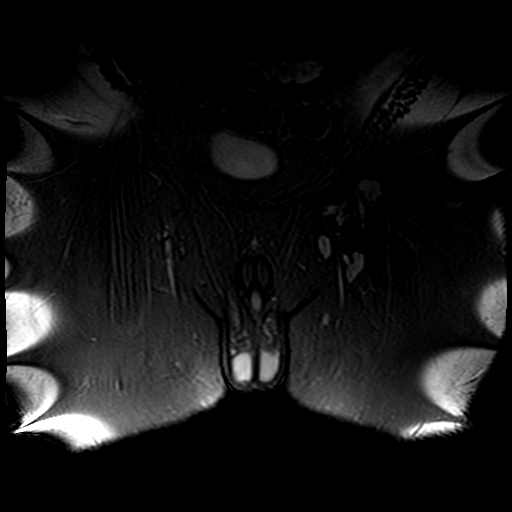
[im 54/54]
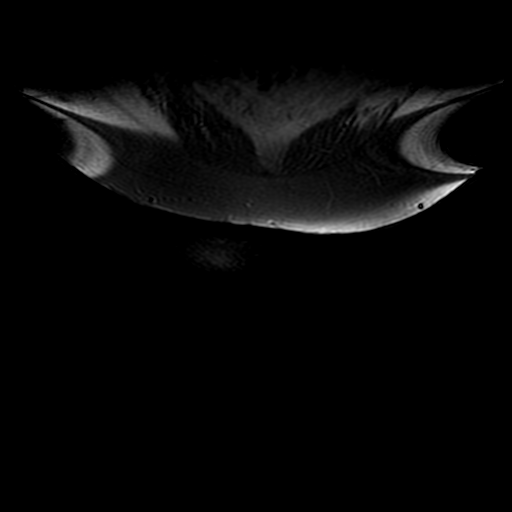

[Series 4: T2 fat-sat · axial · 4.0mm · 0.35mm/px · z∈[-15,+165]mm · 3 of 46 slices shown (2 of 2)]
[im 10/46]
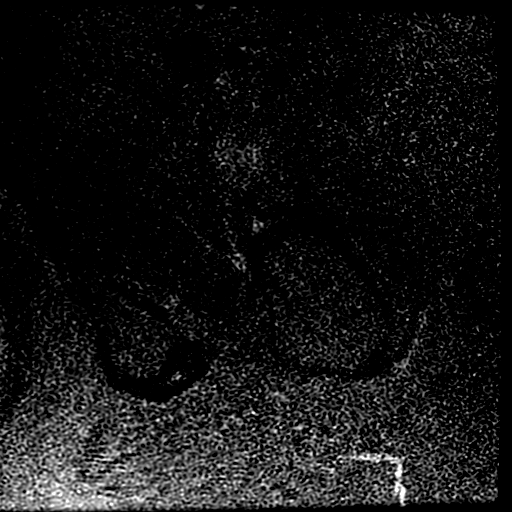
[im 28/46]
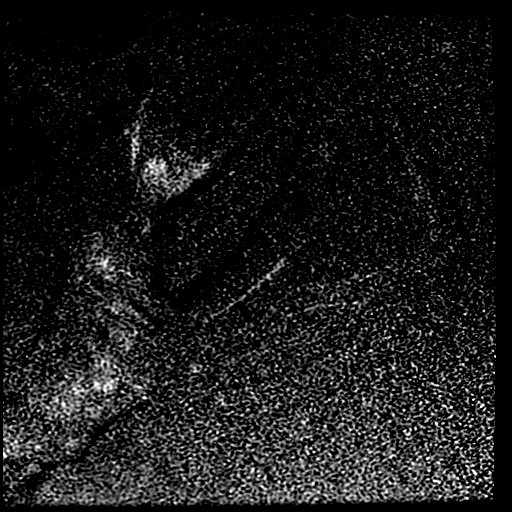
[im 46/46]
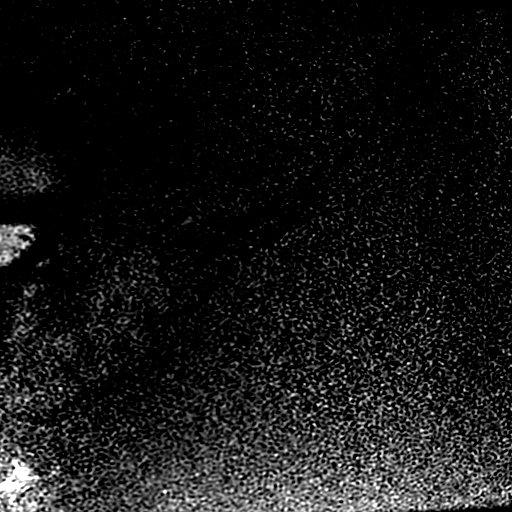

[Series 9: PD fat-sat · sagittal · 4.0mm · 0.35mm/px · 6 of 44 slices shown (1 of 2)]
[im 1/44]
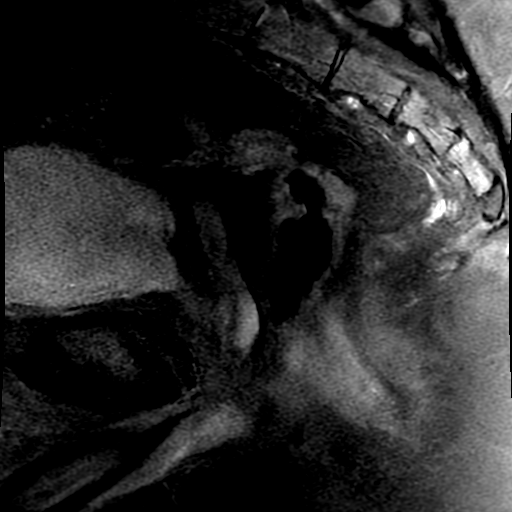
[im 9/44]
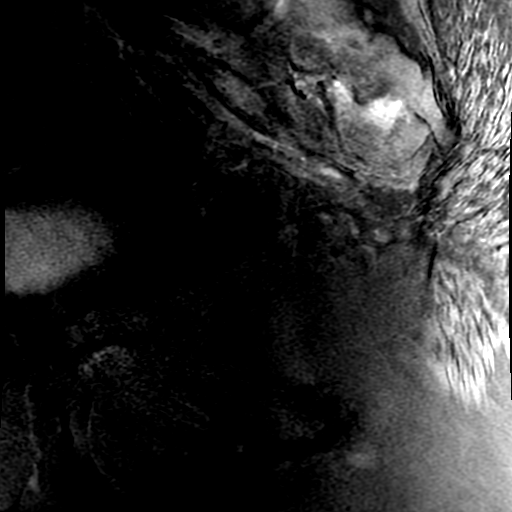
[im 18/44]
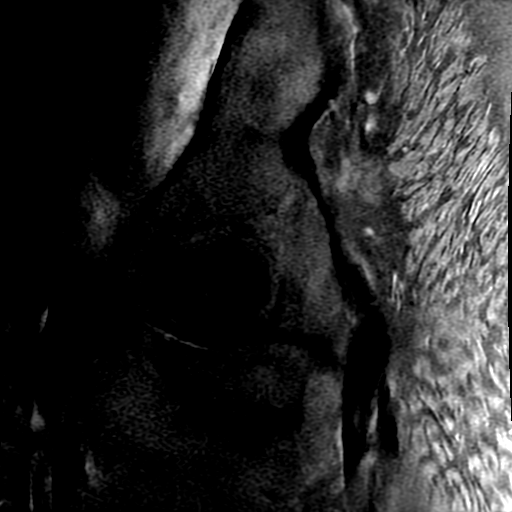
[im 26/44]
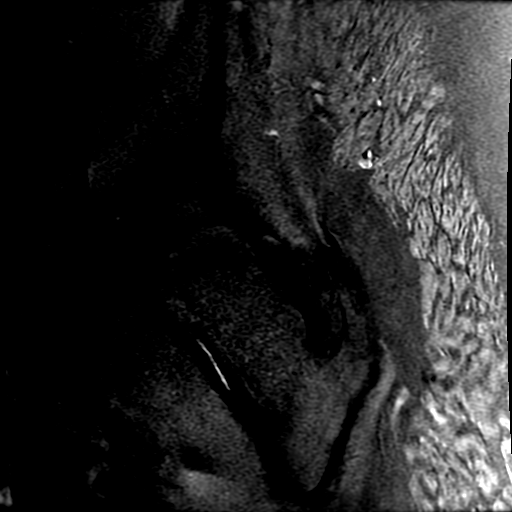
[im 35/44]
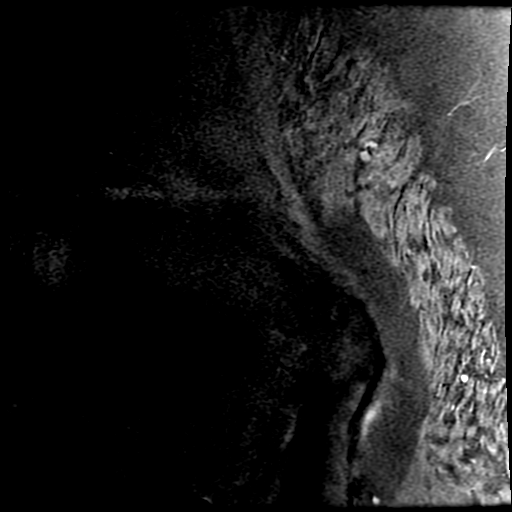
[im 44/44]
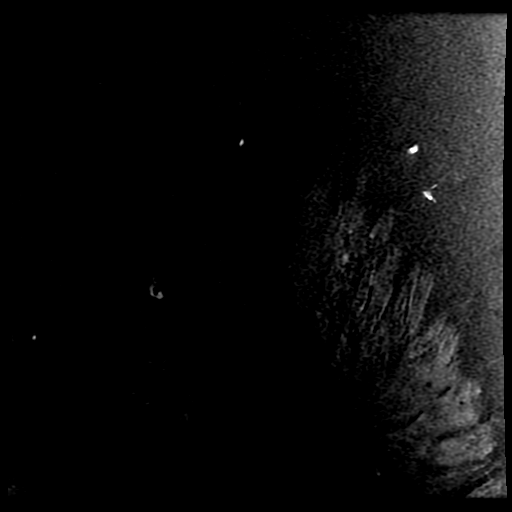

[Series 10: PD fat-sat · coronal · 4.0mm · 0.35mm/px · 4 of 34 slices shown (2 of 2)]
[im 1/34]
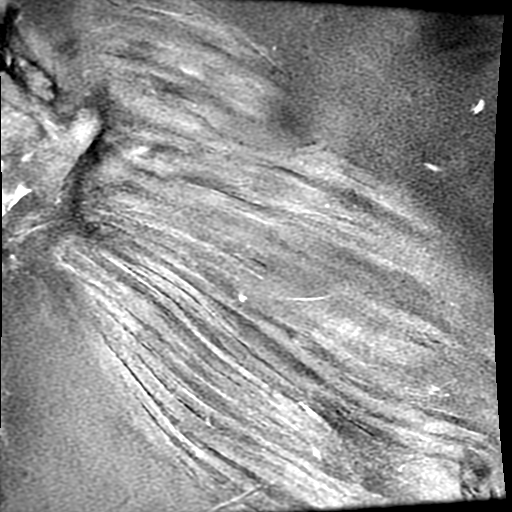
[im 12/34]
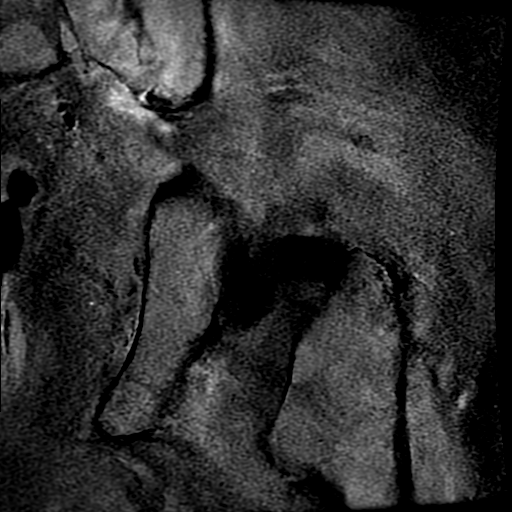
[im 23/34]
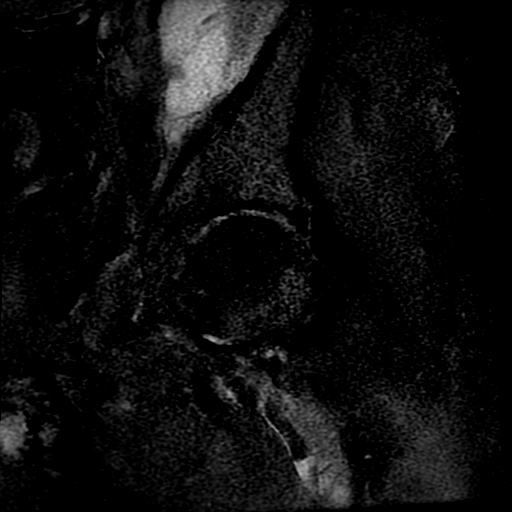
[im 34/34]
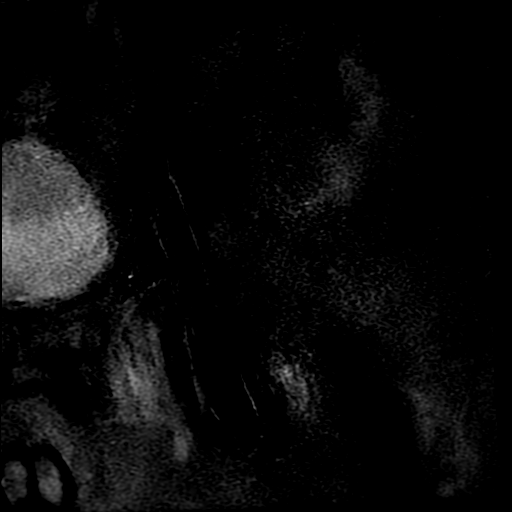

[19 of 40 positions shown; findings below may reference images not displayed]

FINDINGS: Both hips are normally located. Small hip joint effusions but no MR
findings suspicious for septic arthritis or osteomyelitis involving
the hips.

The pubic symphysis is intact.  No findings for septic arthritis.

There are changes of septic arthritis involving the left SI joint.
The joint is slightly widened and contains fluid. There is also an
elongated abscess involving the left iliacus muscle. This courses
down along the iliopsoas tendon all the way to the attachment site.
Associated abnormal marrow signal in the sacrum and iliac bone
consistent with osteomyelitis. Is also a small abscess involving the
left piriformis muscle and surrounding myofasciitis.

The bladder is unremarkable. The rectum and sigmoid colon grossly
normal. Prostate gland and seminal vesicles are normal.

Small bilateral inguinal lymph nodes and bilateral enlarged
obturator region lymph nodes. These are likely
reactive/inflammatory.

Mild edema like signal changes in the right gluteus maximus muscle
suggesting myositis.
IMPRESSION: 1. MR findings consistent with septic arthritis involving the left
SI joint. Associated osteomyelitis involving the sacrum and iliac
bone.
2. Elongated abscess involving the left iliacus muscle.
3. Small abscess involving the left piriformis muscle and
surrounding myofasciitis.
4. No findings for septic arthritis or osteomyelitis involving the
hips.

## 2021-04-29 MED ORDER — SORBITOL 70 % SOLN
30.0000 mL | Freq: Once | Status: DC
  Filled 2021-04-29: qty 30

## 2021-04-29 NOTE — Progress Notes (Signed)
[facility] Progress Note  Subjective: no c/o, in a better mood today. Still voiding, 825 cc out yesterday.   Vitals:   04/28/21 1149 04/28/21 1600 04/28/21 2304 04/29/21 0413  BP: 115/70 118/68 117/77 (!) 120/55  Pulse: 82 84 87 85  Resp: '20 16 19 18  '$ Temp: 97.8 F (36.6 C) 98 F (36.7 C) 98 F (36.7 C) 97.8 F (36.6 C)  TempSrc: Oral Oral Oral Oral  SpO2: 98% 98% 99% 97%  Weight:      Height:        Exam:  alert, nad , obese, nasal O2  Lying flat, no ^wob  no jvd  Chest cta bilat  Cor reg no RG  Abd soft ntnd no ascites   Ext no LE edema   Alert, NF O x3  RIJ TDC      Home meds include - eliquis 5 bid, amiodarone, lanthanum 500 mg ac tid, midodrine '20mg'$  tid      OP HD: TTS/ AKI [facility]    4h  400/800    188.5kg   2/2 bath  TDC RIJ  Hep none   - no in center meds     Assessment/ Plan: MSSA bacteremia -likely catheter related bacteremia with positive blood cultures on 9/22 prompting removal of HD cath by IR on 9/24, +exit site purulence noted at removal. TDC replaced by IR on 9/28 after f/u cx's on 9/25 were negative.  IV cefazolin.  Fatigue/ multi-joint pain -suspected osteoarthritis. Continue PT/OT. Shortness of breath- issue on admit, resolved now. Reportedly pt's O2 equipment ran out or malfunctioned.  He continues to struggle with limiting IDWG.  Continue fluid restriction. Is 5kg under dry wt and no edema on exam.  ESRD - on HD TTS. Refused HD 10/4 yesterday. Next HD tomorrow.  History of biventricular congestive heart failure-end-stage heart failure and not a candidate for assist device or transplant. Hypotension - on midodrine 20 tid Atrial fibrillation -currently appears to be in sinus rhythm and remains on/amiodarone CKD MBD-continue to reinforce renal diet along with phosphorus binder/adherence. Anemia of chronic kidney disease: Without overt blood loss, Hb 9-10, getting 150 ug darbepoetin weekly.       Rob Kayode Petion 04/29/2021,  12:38 PM   Recent Labs  Lab 04/25/21 0808 04/28/21 0631  K 5.5* 5.0  BUN 55* 57*  CREATININE 3.47* 3.14*  CALCIUM 9.9 9.8  PHOS 6.8* 6.7*  HGB 9.7* 10.1*    Inpatient medications:  allopurinol  100 mg Oral Daily   amiodarone  200 mg Oral Daily   apixaban  5 mg Oral BID   bisacodyl  10 mg Rectal Once   Chlorhexidine Gluconate Cloth  6 each Topical Daily   darbepoetin (ARANESP) injection - DIALYSIS  150 mcg Intravenous Q Fri-HD   lanthanum  500 mg Oral TID WC   linaclotide  145 mcg Oral QAC breakfast   LORazepam  1 mg Intravenous Once   midodrine  20 mg Oral TID WC    sodium chloride Stopped (04/23/21 0830)   anticoagulant sodium citrate     anticoagulant sodium citrate      ceFAZolin (ANCEF) IV 1 g (04/28/21 1504)   acetaminophen **OR** acetaminophen, anticoagulant sodium citrate, cyclobenzaprine, HYDROmorphone (DILAUDID) injection, ipratropium-albuterol, ondansetron **OR** ondansetron (ZOFRAN) IV, oxyCODONE, polyethylene glycol, sodium chloride

## 2021-04-29 NOTE — Progress Notes (Signed)
Occupational Therapy Treatment Patient Details Name: Phillip Young MRN: [id number] DOB: 1985/08/15 Today's Date: 04/29/2021   History of present illness Pt is a 35 y.o. male admitted 04/12/21 with SOB, hypoxia. Workup for MSSA bacteremia, acute hypoxic respiratory failure, cardiorenal syndrome. Pt had catheter holiday with clearance of bacteremia; pt declined reinsertion of HD catheter 9/27. Course complicated by R knee and L hip pain; pelvic CT negative for acute abnormality. Of note, recent admission 02/11/21-03/26/21 with cardiogenic shock, biventricular HF, progression to ESRD with need for HD. Other PMH includes CHF, DM2, aflutter, ESRD (HD TTS).   OT comments  Patient was co-treat with PT to address bed mobility, eob sitting balance, and standing with platform walker. Patient was able to get to eob with assistance and attempted to stand x2 from eob with platform walker but was unable to come to stand due to complaints of knee and hip pain.  Patient was positioned with egg crate cushion to assist with transport to MRI. Acute OT to continue to follow.    Recommendations for follow up therapy are one component of a multi-disciplinary discharge planning process, led by the attending physician.  Recommendations may be updated based on patient status, additional functional criteria and insurance authorization.    Follow Up Recommendations  SNF    Equipment Recommendations  Other (comment)    Recommendations for Other Services      Precautions / Restrictions Precautions Precautions: Fall Precaution Comments: Watch SpO2 (wears 1L O2 baseline) Restrictions Weight Bearing Restrictions: No       Mobility Bed Mobility Overal bed mobility: Needs Assistance Bed Mobility: Supine to Sit;Sit to Supine     Supine to sit: Max assist;HOB elevated Sit to supine: Max assist;+2 for physical assistance   General bed mobility comments: increased pain from stting on eob and attempting to stand,  required patient to need more assistance for sit to supine    Transfers                 General transfer comment: unable to come to a complete stand    Balance Overall balance assessment: Needs assistance Sitting-balance support: Feet supported;No upper extremity supported Sitting balance-Leahy Scale: Fair Sitting balance - Comments: patient able to maintain sitting balance but was fearful of falling       Standing balance comment: unable to come to stand using platform walker                           ADL either performed or assessed with clinical judgement   ADL                                               Vision       Perception     Praxis      Cognition Arousal/Alertness: Awake/alert Behavior During Therapy: Restless;Agitated;Anxious Overall Cognitive Status: Within Functional Limits for tasks assessed                                 General Comments: followed commands, limited by pain        Exercises     Shoulder Instructions       General Comments      Pertinent Vitals/ Pain       Pain Assessment:  Faces Faces Pain Scale: Hurts whole lot Pain Location: left hip, right knee Pain Descriptors / Indicators: Grimacing;Moaning;Guarding;Constant;Discomfort;Sore Pain Intervention(s): Premedicated before session;Monitored during session;Limited activity within patient's tolerance  Home Living                                          Prior Functioning/Environment              Frequency  Min 2X/week        Progress Toward Goals  OT Goals(current goals can now be found in the care plan section)  Progress towards OT goals: Progressing toward goals  Acute Rehab OT Goals Patient Stated Goal: to stand up OT Goal Formulation: With patient Time For Goal Achievement: 05/09/21 Potential to Achieve Goals: Fair ADL Goals Pt Will Perform Grooming: with set-up;standing Pt Will  Perform Lower Body Bathing: with min guard assist;sit to/from stand Pt Will Transfer to Toilet: with min guard assist;stand pivot transfer;bedside commode Pt/caregiver will Perform Home Exercise Program: Increased ROM;Left upper extremity;Independently;With written HEP provided  Plan Discharge plan needs to be updated    Co-evaluation    PT/OT/SLP Co-Evaluation/Treatment: Yes Reason for Co-Treatment: Complexity of the patient's impairments (multi-system involvement);For patient/therapist safety   OT goals addressed during session: Other (comment) (bed mobility)      AM-PAC OT "6 Clicks" Daily Activity     Outcome Measure   Help from another person eating meals?: A Little Help from another person taking care of personal grooming?: A Little Help from another person toileting, which includes using toliet, bedpan, or urinal?: A Lot Help from another person bathing (including washing, rinsing, drying)?: A Lot Help from another person to put on and taking off regular upper body clothing?: A Little Help from another person to put on and taking off regular lower body clothing?: A Lot 6 Click Score: 15    End of Session Equipment Utilized During Treatment: Oxygen;Other (comment) (platform walker)  OT Visit Diagnosis: Unsteadiness on feet (R26.81);Other abnormalities of gait and mobility (R26.89);Muscle weakness (generalized) (M62.81);Pain Pain - Right/Left: Left Pain - part of body: Hip   Activity Tolerance Patient limited by pain   Patient Left in bed;with call bell/phone within reach   Nurse Communication Other (comment) (nursing present for sit to supine and assisted in positioning in bed)        Time: [id number] OT Time Calculation (min): 56 min  Charges: OT General Charges $OT Visit: 1 Visit OT Treatments $Therapeutic Activity: 23-37 mins  Lodema Hong, OTA   Sadaf Przybysz Alexis Goodell 04/29/2021, 8:59 AM

## 2021-04-29 NOTE — TOC Progression Note (Signed)
Transition of Care [facility]) - Progression Note    Patient Details  Name: Phillip Young MRN: [id number] Date of Birth: [birth year]  [facility]) CM/SW Contact  Reece Agar, [place] Phone Number: 04/29/2021, 4:39 PM  Clinical Narrative:    CSW spoke with pt in room about SNF and pt barriers. CSW explained that non compliance and behaviors has an effect of pt care and pt facility options. Pt states he understands and has started to comply bc he has a goal of getting rehab to be able to walk again.    Expected Discharge Plan: [place] Barriers to Discharge: Continued Medical Work up  Expected Discharge Plan and Services Expected Discharge Plan: [place] In-house Referral: Clinical Social Work Discharge Planning Services: CM Consult Post Acute Care Choice: [place] arrangements for the past 2 months: [place]                   DME Agency: [employer] Date DME Agency Contacted: 04/14/21 Time DME Agency Contacted: [phone] Representative spoke with at DME Agency: Andree Coss             Social Determinants of Health (SDOH) Interventions Food Insecurity Interventions: Assist with SNAP Application Financial Strain Interventions: Other (Comment) (Referral to [facility]) Housing Interventions: Intervention Not Indicated Transportation Interventions: Intervention Not Indicated  Readmission Risk Interventions Readmission Risk Prevention Plan 10/16/2020 10/16/2020 10/14/2020  Transportation Screening Complete Complete Complete  PCP or Specialist Appt within 5-7 Days Complete Complete Complete  Home Care Screening Complete - -  Medication Review (RN CM) Referral to Pharmacy - -  Some recent data might be hidden

## 2021-04-29 NOTE — Progress Notes (Signed)
Pt to MRI

## 2021-04-29 NOTE — Progress Notes (Signed)
Physical Therapy Treatment Patient Details Name: Phillip Young MRN: [id number] DOB: Aug 28, 1985 Today's Date: 04/29/2021   History of Present Illness Pt is a 35 y.o. male admitted 04/12/21 with SOB, hypoxia. Workup for MSSA bacteremia, acute hypoxic respiratory failure, cardiorenal syndrome. Pt had catheter holiday with clearance of bacteremia; pt declined reinsertion of HD catheter 9/27. Course complicated by R knee and L hip pain; pelvic CT negative for acute abnormality. Of note, recent admission 02/11/21-03/26/21 with cardiogenic shock, biventricular HF, progression to ESRD with need for HD. Other PMH includes CHF, DM2, aflutter, ESRD (HD TTS).    PT Comments    Pt agreeable to working with therapy. Co-treat with OT given hx of therapy refusal and assist needed for mobility with goal to place foam under pt to be able to better tolerate MRI of R hip. Pt able to initiate movement for coming to EoB ultimately requiring maxAx2. Attempted x2 for coming to stand at platform RW, pt limited by pain and as reported by pt "my muscles don't work." Pt requires maxAx2 for leaning EoB to place foam under hips prior to return to bed requiring maxAx2 for management of LE back into bed. Given pt's discharge of SNF level rehab and frequent refusal, pt frequency decreased to 2x/wk. Goals reviewed and updated this date.     Recommendations for follow up therapy are one component of a multi-disciplinary discharge planning process, led by the attending physician.  Recommendations may be updated based on patient status, additional functional criteria and insurance authorization.  Follow Up Recommendations  SNF;Supervision for mobility/OOB;Other (comment)     Equipment Recommendations  Other (comment) (TBD, Rollator in room)       Precautions / Restrictions Precautions Precautions: Fall Precaution Comments: Watch SpO2 (wears 1L O2 baseline) Restrictions Weight Bearing Restrictions: No     Mobility  Bed  Mobility Overal bed mobility: Needs Assistance Bed Mobility: Supine to Sit;Sit to Supine     Supine to sit: Max assist;HOB elevated Sit to supine: Max assist;+2 for physical assistance   General bed mobility comments: increased pain from stting on eob and attempting to stand, required patient to need more assistance for sit to supine    Transfers                 General transfer comment: unable to come to a complete stand, pt reports "my muscles don't work at all        Balance Overall balance assessment: Needs assistance Sitting-balance support: Feet supported;No upper extremity supported Sitting balance-Leahy Scale: Fair Sitting balance - Comments: patient able to maintain sitting balance but was fearful of falling       Standing balance comment: unable to come to stand using platform walker                            Cognition Arousal/Alertness: Awake/alert Behavior During Therapy: Restless;Agitated;Anxious Overall Cognitive Status: Within Functional Limits for tasks assessed                                 General Comments: followed commands, limited by pain         General Comments General comments (skin integrity, edema, etc.): VSS on 5L O2 via [place]      Pertinent Vitals/Pain Pain Assessment: Faces Faces Pain Scale: Hurts whole lot Pain Location: left hip, right knee Pain Descriptors / Indicators: Grimacing;Moaning;Guarding;Constant;Discomfort;Sore Pain Intervention(s): Limited  activity within patient's tolerance;Monitored during session;Premedicated before session;Repositioned     PT Goals (current goals can now be found in the care plan section) Acute Rehab PT Goals Patient Stated Goal: to stand up PT Goal Formulation: With patient Time For Goal Achievement: 05/13/21 Potential to Achieve Goals: Fair Progress towards PT goals: Progressing toward goals    Frequency    Min 2X/week      PT Plan Frequency needs to be  updated    Co-evaluation PT/OT/SLP Co-Evaluation/Treatment: Yes Reason for Co-Treatment: Complexity of the patient's impairments (multi-system involvement) PT goals addressed during session: Mobility/safety with mobility;Other (comment) (placement of foam padding to be able to tolerate MRI) OT goals addressed during session: Other (comment) (bed mobility)      AM-PAC PT "6 Clicks" Mobility   Outcome Measure  Help needed turning from your back to your side while in a flat bed without using bedrails?: A Lot Help needed moving from lying on your back to sitting on the side of a flat bed without using bedrails?: Total Help needed moving to and from a bed to a chair (including a wheelchair)?: Total Help needed standing up from a chair using your arms (e.g., wheelchair or bedside chair)?: Total Help needed to walk in hospital room?: Total Help needed climbing 3-5 steps with a railing? : Total 6 Click Score: 7    End of Session Equipment Utilized During Treatment: Oxygen Activity Tolerance: Patient limited by pain Patient left: in bed;with call bell/phone within reach;with bed alarm set Nurse Communication: Mobility status PT Visit Diagnosis: Difficulty in walking, not elsewhere classified (R26.2);Other abnormalities of gait and mobility (R26.89);Pain Pain - Right/Left: Left Pain - part of body: Hip;Knee     Time: [id number] PT Time Calculation (min) (ACUTE ONLY): 54 min  Charges:  $Therapeutic Activity: 23-37 mins                     Brexley Cutshaw B. Migdalia Dk PT, DPT Acute Rehabilitation Services Pager [phone] Office [phone]    [place] 04/29/2021, 9:52 AM

## 2021-04-29 NOTE — Progress Notes (Addendum)
PROGRESS NOTE    Ge Slaski  [id number] DOB: 12/31/1985 DOA: 04/12/2021 PCP: [name], MD   Brief Narrative: 35 year old with past medical history significant for severe biventricular systolic heart failure with known ejection fraction less than 20%, diabetes type 2, paroxysmal a flutter on Eliquis, recently started on hemodialysis with recent extensive hospitalization with cardiogenic shock and cardiorenal syndrome progressed to ESRD presented back to the emergency room with shortness of breath, left wrist and right knee pain, unable to ambulate.  Home oxygen concentrator not working.  Chest x-ray showed pulmonary edema, COVID-19 negative.  Patient was found to have MSSA bacteremia due to infected dialysis catheter.  ID and nephrology consulted.  After catheter holiday he received new permanent cath on 9/28 and restarted on hemodialysis. Patient has been declining treatments for the last week on and off.  He now has agreed to dialysis and MRI to evaluate hip pain.   Assessment & Plan:   Principal Problem:   MSSA bacteremia Active Problems:   Morbid obesity ([place])   GERD without esophagitis   OSA (obstructive sleep apnea)   Chronic systolic CHF (congestive heart failure) ([name])   Paroxysmal atrial flutter ([name])   Hyponatremia   ESRD (end stage renal disease) on dialysis ([place])   Acute on chronic respiratory failure with hypoxia ([name])   Medical non-compliance   Hypotension   ESRD on dialysis [facility])   Palliative care by specialist   Full code status   Concern about end of life   1-MSSA Bacteremia; Arthralgia;  Possible septic arthritis unable to prove it.  CT left Hip 9/25 destructive changes.  CT scan right knee no acute finding.  4- out of 4 Blood culture 9/22. Positive for MSSA.  ECHO; no evidence of vegetation.  Perm cath removed ID consulted, recommend IV antibiotics for 6 weeks. Until Nov 5th.  Awaiting MRI results  On pain medication: dilaudid and Oxy.   Refused TEE initially, cardiology wants  more volume removed prior to TEE Repeated Blood cultures negative   2-Acute on chronic respiratory failure with hypoxemia and hypercapnia: Due to malfunctioning concentrator at home now with fluid overload Volume managed with hemodialysis. Hemodialysis yesterday due to pain , agreed to get dialysis today  3-ESRD on hemodialysis: Had a new PermCath He has been declining dialysis on and off. Was evaluated by palliative care, patient agreed to have hemodialysis  Biventricular heart failure, paroxysmal A. fib, hypotension: Action fraction less than 20%.  Followed by heart failure team.  Intolerance to advanced heart failure therapies.  On midodrine 20 3 times daily Volume managed with hemodialysis. On amiodarone for paroxysmal A. fib.  Morbid obesity: BMI more than 50. On Semaglutide.  Hyponatremia; volume manage with HD.   Constipation; Sorbitol ordered     Estimated body mass index is 56.45 kg/m as calculated from the following:   Height as of this encounter: '5\' 11"'$  (1.803 m).   Weight as of this encounter: 183.6 kg.   DVT prophylaxis: Eliquis Code Status: Full Code Family Communication:  Disposition Plan:  Status is: Inpatient  Remains inpatient appropriate because:IV treatments appropriate due to intensity of illness or inability to take PO  Dispo: The patient is from: Home              Anticipated d/c is to: SNF              Patient currently is not medically stable to d/c.   Difficult to place patient No  Consultants:  ID Nephrology   Procedures:  HD  Antimicrobials:  Cefazolin   Subjective: He is feeling tired, malaise.  No bowel movement in 17 days. Decline enema.   Objective: Vitals:   04/28/21 1600 04/28/21 2304 04/29/21 0413 04/29/21 1326  BP: 118/68 117/77 (!) 120/55 118/71  Pulse: 84 87 85 83  Resp: '16 19 18 15  '$ Temp: 98 F (36.7 C) 98 F (36.7 C) 97.8 F (36.6 C)   TempSrc: Oral Oral  Oral   SpO2: 98% 99% 97% 96%  Weight:      Height:        Intake/Output Summary (Last 24 hours) at 04/29/2021 1554 Last data filed at 04/29/2021 0659 Gross per 24 hour  Intake 1062 ml  Output 475 ml  Net 587 ml   Filed Weights   04/24/21 1106 04/25/21 0853 04/25/21 1141  Weight: (!) 183.7 kg (!) 184.9 kg (!) 183.6 kg    Examination:  General exam: Appears calm and comfortable  Respiratory system: Clear to auscultation. Respiratory effort normal. Cardiovascular system: S1 & S2 heard, RRR. Gastrointestinal system: Abdomen is nondistended, soft and nontender. No organomegaly or masses felt. Normal bowel sounds heard. Central nervous system: Alert sleepy  Extremities: Symmetric 5 x 5 power.   Data Reviewed: I have personally reviewed following labs and imaging studies  CBC: Recent Labs  Lab 04/24/21 0726 04/25/21 0808 04/28/21 0631  WBC 11.3* 10.6* 9.4  HGB 8.7* 9.7* 10.1*  HCT 28.3* 31.5* 31.6*  MCV 101.1* 98.4 96.3  PLT 404* 447* [id number]*   Basic Metabolic Panel: Recent Labs  Lab 04/23/21 0648 04/24/21 0726 04/25/21 0808 04/28/21 0631  NA 132* 132* 129* 129*  K 4.9 5.0 5.5* 5.0  CL 93* 92* 92* 93*  CO2 '26 27 26 27  '$ GLUCOSE 103* 139* 99 88  BUN 61* 80* 55* 57*  CREATININE 3.15* 3.37* 3.47* 3.14*  CALCIUM 9.8 9.7 9.9 9.8  PHOS  --  6.0* 6.8* 6.7*   GFR: Estimated Creatinine Clearance: 54.6 mL/min (A) (by C-G formula based on SCr of 3.14 mg/dL (H)). Liver Function Tests: Recent Labs  Lab 04/24/21 0726 04/25/21 0808 04/28/21 0631  ALBUMIN 2.6* 2.8* 2.7*   No results for input(s): LIPASE, AMYLASE in the last 168 hours. No results for input(s): AMMONIA in the last 168 hours. Coagulation Profile: Recent Labs  Lab 04/23/21 0648  INR 1.3*   Cardiac Enzymes: No results for input(s): CKTOTAL, CKMB, CKMBINDEX, TROPONINI in the last 168 hours. BNP (last 3 results) No results for input(s): PROBNP in the last 8760 hours. HbA1C: No results for input(s):  HGBA1C in the last 72 hours. CBG: No results for input(s): GLUCAP in the last 168 hours. Lipid Profile: No results for input(s): CHOL, HDL, LDLCALC, TRIG, CHOLHDL, LDLDIRECT in the last 72 hours. Thyroid Function Tests: No results for input(s): TSH, T4TOTAL, FREET4, T3FREE, THYROIDAB in the last 72 hours. Anemia Panel: No results for input(s): VITAMINB12, FOLATE, FERRITIN, TIBC, IRON, RETICCTPCT in the last 72 hours. Sepsis Labs: No results for input(s): PROCALCITON, LATICACIDVEN in the last 168 hours.  No results found for this or any previous visit (from the past 240 hour(s)).       Radiology Studies: No results found.      Scheduled Meds:  allopurinol  100 mg Oral Daily   amiodarone  200 mg Oral Daily   apixaban  5 mg Oral BID   bisacodyl  10 mg Rectal Once   Chlorhexidine Gluconate Cloth  6 each Topical  Daily   darbepoetin (ARANESP) injection - DIALYSIS  150 mcg Intravenous Q Fri-HD   lanthanum  500 mg Oral TID WC   linaclotide  145 mcg Oral QAC breakfast   LORazepam  1 mg Intravenous Once   midodrine  20 mg Oral TID WC   Continuous Infusions:  sodium chloride Stopped (04/23/21 0830)   anticoagulant sodium citrate     anticoagulant sodium citrate      ceFAZolin (ANCEF) IV 1 g (04/28/21 1504)     LOS: 13 days    Time spent: 35 minutes.     Elmarie Shiley, MD [facility]   If 7PM-7AM, please contact night-coverage [email or web address]  04/29/2021, 3:54 PM

## 2021-04-29 NOTE — Progress Notes (Signed)
RT  NOTE:  BIPAP setup for use @ bedside. Pt states he can put on when he is ready to wear.

## 2021-04-29 NOTE — Plan of Care (Signed)
  Problem: Coping: Goal: Level of anxiety will decrease Outcome: Not Progressing   Problem: Pain Managment: Goal: General experience of comfort will improve Outcome: Not Progressing   

## 2021-04-29 NOTE — Progress Notes (Addendum)
Pt educated on importance of diagnostic testing and HD during stay. Pt agreed to both. Plan made to medicate patient with appropriate pain meds at appropriate time to facilitate comfort during imaging. Called MRI to get patient on the schedule.

## 2021-04-30 DIAGNOSIS — B9561 Methicillin susceptible Staphylococcus aureus infection as the cause of diseases classified elsewhere: Secondary | ICD-10-CM | POA: Diagnosis not present

## 2021-04-30 DIAGNOSIS — R7881 Bacteremia: Secondary | ICD-10-CM | POA: Diagnosis not present

## 2021-04-30 LAB — CBC
HCT: 30.4 % — ABNORMAL LOW (ref 39.0–52.0)
Hemoglobin: 9.6 g/dL — ABNORMAL LOW (ref 13.0–17.0)
MCH: 30.7 pg (ref 26.0–34.0)
MCHC: 31.6 g/dL (ref 30.0–36.0)
MCV: 97.1 fL (ref 80.0–100.0)
Platelets: 482 10*3/uL — ABNORMAL HIGH (ref 150–400)
RBC: 3.13 MIL/uL — ABNORMAL LOW (ref 4.22–5.81)
RDW: 17.9 % — ABNORMAL HIGH (ref 11.5–15.5)
WBC: 8.6 10*3/uL (ref 4.0–10.5)
nRBC: 0 % (ref 0.0–0.2)

## 2021-04-30 LAB — RENAL FUNCTION PANEL
Albumin: 2.6 g/dL — ABNORMAL LOW (ref 3.5–5.0)
Anion gap: 11 (ref 5–15)
BUN: 53 mg/dL — ABNORMAL HIGH (ref 6–20)
CO2: 27 mmol/L (ref 22–32)
Calcium: 9.9 mg/dL (ref 8.9–10.3)
Chloride: 93 mmol/L — ABNORMAL LOW (ref 98–111)
Creatinine, Ser: 3.03 mg/dL — ABNORMAL HIGH (ref 0.61–1.24)
GFR, Estimated: 26 mL/min — ABNORMAL LOW (ref >60)
Glucose, Bld: 117 mg/dL — ABNORMAL HIGH (ref 70–99)
Phosphorus: 5 mg/dL — ABNORMAL HIGH (ref 2.5–4.6)
Potassium: 4.9 mmol/L (ref 3.5–5.1)
Sodium: 131 mmol/L — ABNORMAL LOW (ref 135–145)

## 2021-04-30 MED ORDER — LANTHANUM CARBONATE 500 MG PO CHEW
1000.0000 mg | CHEWABLE_TABLET | Freq: Three times a day (TID) | ORAL | Status: DC
  Administered 2021-04-30 – 2021-05-19 (×24): 1000 mg via ORAL
  Filled 2021-04-30 (×60): qty 2

## 2021-04-30 MED ORDER — DARBEPOETIN ALFA 150 MCG/0.3ML IJ SOSY
150.0000 ug | PREFILLED_SYRINGE | INTRAMUSCULAR | Status: DC
  Administered 2021-04-30: 150 ug via INTRAVENOUS
  Filled 2021-04-30 (×3): qty 0.3

## 2021-04-30 MED ORDER — POLYETHYLENE GLYCOL 3350 17 G PO PACK
17.0000 g | PACK | Freq: Two times a day (BID) | ORAL | Status: DC
  Administered 2021-05-16 – 2021-05-18 (×2): 17 g via ORAL
  Filled 2021-04-30 (×15): qty 1

## 2021-04-30 NOTE — Plan of Care (Signed)
  Problem: Health Behavior/Discharge Planning: Goal: Ability to manage health-related needs will improve Outcome: Not Progressing   Problem: Clinical Measurements: Goal: Diagnostic test results will improve Outcome: Not Progressing   Problem: Activity: Goal: Risk for activity intolerance will decrease Outcome: Not Progressing   Problem: Coping: Goal: Level of anxiety will decrease Outcome: Not Progressing   Problem: Activity: Goal: Capacity to carry out activities will improve Outcome: Not Progressing   Problem: Education: Goal: Knowledge of disease and its progression will improve Outcome: Not Progressing   Problem: Health Behavior/Discharge Planning: Goal: Ability to manage health-related needs will improve Outcome: Not Progressing   Problem: Activity: Goal: Activity intolerance will improve Outcome: Not Progressing   Problem: Fluid Volume: Goal: Fluid volume balance will be maintained or improved Outcome: Not Progressing   Problem: Nutritional: Goal: Ability to make appropriate dietary choices will improve Outcome: Not Progressing

## 2021-04-30 NOTE — TOC Progression Note (Signed)
Transition of Care [facility]) - Progression Note    Patient Details  Name: Phillip Young MRN: [id number] Date of Birth: 1985-12-03  Transition [facility]) CM/SW Contact  Reece Agar, [place] Phone Number: 04/30/2021, 3:53 PM  Clinical Narrative:    CSW contacted [facility], pt is not a candidate. CSW contacted [place] and [facility], neither facility can accept pt. CSW contacted [facility] in [place], admissions will review pt chart and follow up with CSW for decision.    Expected Discharge Plan: [place] Barriers to Discharge: Continued Medical Work up  Expected Discharge Plan and Services Expected Discharge Plan: [place] In-house Referral: Clinical Social Work Discharge Planning Services: CM Consult Post Acute Care Choice: [place] arrangements for the past 2 months: [place]                   DME Agency: [employer] Date DME Agency Contacted: 04/14/21 Time DME Agency Contacted: [phone] Representative spoke with at DME Agency: Andree Coss             Social Determinants of Health (SDOH) Interventions Food Insecurity Interventions: Assist with SNAP Application Financial Strain Interventions: Other (Comment) (Referral to [facility]) Housing Interventions: Intervention Not Indicated Transportation Interventions: Intervention Not Indicated  Readmission Risk Interventions Readmission Risk Prevention Plan 10/16/2020 10/16/2020 10/14/2020  Transportation Screening Complete Complete Complete  PCP or Specialist Appt within 5-7 Days Complete Complete Complete  Home Care Screening Complete - -  Medication Review (RN CM) Referral to Pharmacy - -  Some recent data might be hidden

## 2021-04-30 NOTE — Consult Note (Signed)
ORTHOPAEDIC CONSULTATION  REQUESTING PHYSICIAN: Elmarie Shiley, MD  Chief Complaint: Sacroiliac infection  HPI: Phillip Young is a 35 y.o. male who presents with with a left sacroiliac infection with extension into the iliacis muscle.  He has been hospitalized now for 3 weeks found to have sepsis due to infected hemodialysis catheter.  He does have a history of morbid obesity.  Per him he does describe a significant history of abscesses around the body.  He does have significant heart failure in the setting of a very low ejection fraction.  Past Medical History:  Diagnosis Date   Biventricular congestive heart failure ([place])    Last Echo 11/2019 at [facility] reveals EF 20%   Class 3 severe obesity due to excess calories with serious comorbidity and body mass index (BMI) of 50.0 to 59.9 in adult [facility]) 02/26/2020   Essential hypertension 02/26/2020   GERD without esophagitis 02/26/2020   Hidradenitis suppurativa 02/26/2020   OSA (obstructive sleep apnea) 02/26/2020   Prediabetes 02/26/2020   Renal disorder    Past Surgical History:  Procedure Laterality Date   ABSCESS DRAINAGE     IR FLUORO GUIDE CV LINE RIGHT  03/10/2021   IR FLUORO GUIDE CV LINE RIGHT  04/22/2021   IR US GUIDE VASC ACCESS RIGHT  03/10/2021   IR US GUIDE VASC ACCESS RIGHT  04/22/2021   RIGHT HEART CATH N/A 03/06/2021   Procedure: RIGHT HEART CATH;  Surgeon: Jolaine Artist, MD;  Location: [place] CV [facility];  Service: Cardiovascular;  Laterality: N/A;   RIGHT/LEFT HEART CATH AND CORONARY ANGIOGRAPHY N/A 03/04/2020   Procedure: RIGHT/LEFT HEART CATH AND CORONARY ANGIOGRAPHY;  Surgeon: Larey Dresser, MD;  Location: [place] [facility];  Service: Cardiovascular;  Laterality: N/A;   TEMPORARY DIALYSIS CATHETER  03/06/2021   Procedure: TEMPORARY DIALYSIS CATHETER;  Surgeon: Jolaine Artist, MD;  Location: [place] CV LAB;  Service: Cardiovascular;;   Social History   Socioeconomic History   Marital  status: Single    Spouse name: Not on file   Number of children: Not on file   Years of education: Not on file   Highest education level: Not on file  Occupational History   Not on file  Tobacco Use   Smoking status: Former    Packs/day: 1.00    Types: Cigarettes    Quit date: 2019    Years since quitting: 3.7   Smokeless tobacco: Former   Tobacco comments:    quit in 2019  Substance and Sexual Activity   Alcohol use: Never   Drug use: Never   Sexual activity: Not on file  Other Topics Concern   Not on file  Social History Narrative   Not on file   Social Determinants of Health   Financial Resource Strain: Low Risk    Difficulty of Paying Living Expenses: Not very hard  Food Insecurity: Food Insecurity Present   Worried About [place] in the Last Year: Never true   Ran Out of Food in the Last Year: Sometimes true  Transportation Needs: No Transportation Needs   Lack of Transportation (Medical): No   Lack of Transportation (Non-Medical): No  Physical Activity: Not on file  Stress: Not on file  Social Connections: Not on file   Family History  Problem Relation Age of Onset   Heart disease Mother    Hypertension Mother    Pulmonary Hypertension Mother    Drug abuse Father  died due to Heroin overdose   - negative except otherwise stated in the family history section Allergies  Allergen Reactions   Coreg [Carvedilol] Shortness Of Breath and Diarrhea    Wheezing    Heparin Other (See Comments)    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 Authorizing Provider  amiodarone (PACERONE) 200 MG tablet Take 1 tablet (200 mg) by mouth twice a day for 10 days, then take 1 tablet daily. Patient taking differently: Take 200 mg by mouth daily. 03/26/21  Yes Carollo, Farley Ly, NP  apixaban (ELIQUIS) 5 MG TABS tablet Take 1 tablet (5 mg total) by mouth 2 (two) times daily. 03/26/21  Yes  Carollo, Farley Ly, NP  lanthanum (FOSRENOL) 500 MG chewable tablet Chew 1 tablet (500 mg total) by mouth 3 (three) times daily with meals. 03/26/21  Yes Carollo, Farley Ly, NP  midodrine (PROAMATINE) 10 MG tablet Take 2 tablets (20 mg total) by mouth 3 (three) times daily with meals. Do NOT hold for dialysis. TAKE DURING DAYLIGHT HOURS ONLY 03/26/21  Yes Carollo, Farley Ly, NP  Semaglutide,0.25 or 0.'5MG'$ /DOS, (OZEMPIC, 0.25 OR 0.5 MG/DOSE,) 2 MG/1.5ML SOPN Inject 0.25 mg into the skin once a week. Increase as tolerated after 4 weeks to 0.5 mg weekly. Patient not taking: Reported on 04/12/2021 04/07/21   Larey Dresser, MD   MR HIP LEFT WO CONTRAST  Result Date: 04/29/2021 CLINICAL DATA:  Left hip pain.  MRSA bacteremia. EXAM: MR OF THE LEFT HIP WITHOUT CONTRAST TECHNIQUE: Multiplanar, multisequence MR imaging was performed. No intravenous contrast was administered. COMPARISON:  CT scan 04/19/2021 FINDINGS: Both hips are normally located. Small hip joint effusions but no MR findings suspicious for septic arthritis or osteomyelitis involving the hips. The pubic symphysis is intact.  No findings for septic arthritis. There are changes of septic arthritis involving the left SI joint. The joint is slightly widened and contains fluid. There is also an elongated abscess involving the left iliacus muscle. This courses down along the iliopsoas tendon all the way to the attachment site. Associated abnormal marrow signal in the sacrum and iliac bone consistent with osteomyelitis. Is also a small abscess involving the left piriformis muscle and surrounding myofasciitis. The bladder is unremarkable. The rectum and sigmoid colon grossly normal. Prostate gland and seminal vesicles are normal. Small bilateral inguinal lymph nodes and bilateral enlarged obturator region lymph nodes. These are likely reactive/inflammatory. Mild edema like signal changes in the right gluteus maximus muscle suggesting myositis. IMPRESSION: 1. MR findings  consistent with septic arthritis involving the left SI joint. Associated osteomyelitis involving the sacrum and iliac bone. 2. Elongated abscess involving the left iliacus muscle. 3. Small abscess involving the left piriformis muscle and surrounding myofasciitis. 4. No findings for septic arthritis or osteomyelitis involving the hips. Electronically Signed   By: Marijo Sanes M.D.   On: 04/29/2021 17:59     Positive ROS: All other systems have been reviewed and were otherwise negative with the exception of those mentioned in the HPI and as above.  Physical Exam: General: No acute distress  MUSCULOSKELETAL:  Minimal pain with logroll about the left hip.  He is able to flex at the left hip although this is painful in the left buttock area.  Sensation is intact distally.  Toes are warm and well-perfused  Independent Imaging Review: MRI shows SI joint infection with extension into the iliopsoas/iliac [place] interval.  There does  not appear to be involvement of the left hip  Assessment: 35 year old male with morbid obesity and an ejection fraction of 20% in the setting of heart failure presents with left SI joint infection with extension into the iliacus/iliopsoas.  Given the patient's medical comorbidities, I do not believe that he would be healthy enough towards sustaining cerebral surgical debridement.  At this time I would recommend consultation with IR to assess candidacy for a percutaneous type drainage.  Also recommend consultation with general surgery to see if patient would be a candidate for any type of debridement procedure, although this is unlikely   Thank you for the consult and the opportunity to see Mr. Lamarcus Bushy, MD [facility] 7:20 PM

## 2021-04-30 NOTE — Progress Notes (Signed)
Inpatient Rehab Admissions Coordinator:   Per CSW request, pt was screened for CIR appropriateness by Shann Medal, PT, DPT.  At this time, note frequent refusals for mobility 2/2 pain, and therapy decreasing frequency to 2x/week due to tolerance.  Do not feel that he could tolerate intensity of CIR program at this time, nor would I be able to get insurance approval with SNF recs (which are appropriate).  Not a candidate for CIR at this time.    Shann Medal, PT, DPT Admissions Coordinator [phone] 04/30/21  2:39 PM

## 2021-04-30 NOTE — Progress Notes (Signed)
   Dr [name] discussed with Dr Tyrell Antonio.  Phillip Young is at high risk for general anesthesia. Percutaneous drain preferred.   Once stable can set up TEE to assess for vegetation.   [name] NP-C  1:32 PM

## 2021-04-30 NOTE — Progress Notes (Signed)
Patient ID: Phillip Young, male   DOB: 04-01-1986, 35 y.o.   MRN: [id number]  Reviewed case with MD and agree with orthopedics assessment. Would not recommend surgery in this high risk patient.  [name], [facility] 04/30/2021, 4:28 PM Please see Amion for pager number during day hours 7:00am-4:30pm

## 2021-04-30 NOTE — Progress Notes (Addendum)
[facility] Progress Note   Subjective:  Seen on HD - 3L UFG and tolerating so far. No LE edema today, still using nasal O2. Denies CP/dyspnea. MRI 10/5 with sacral osteomyelitis + 2 small abscesses  Objective Vitals:   04/29/21 2011 04/29/21 2337 04/30/21 0426 04/30/21 0759  BP: 125/85 (!) 125/98 108/76 133/60  Pulse: 84 82 81 78  Resp: '20 20 15 14  '$ Temp: 98 F (36.7 C) 98.9 F (37.2 C) 98.2 F (36.8 C) 98.8 F (37.1 C)  TempSrc: Oral Oral Oral Oral  SpO2: 96% 99% 99%   Weight:      Height:       Physical Exam General: Obese man, NAD. Nasal O2 in place. Heart: RRR; no murmur Lungs: CTA anteriorly Abdomen: soft, non-tender Extremities: No LE edema Dialysis Access:  TDC in R chest  Additional Objective Labs: Basic Metabolic Panel: Recent Labs  Lab 04/24/21 0726 04/25/21 0808 04/28/21 0631  NA 132* 129* 129*  K 5.0 5.5* 5.0  CL 92* 92* 93*  CO2 '27 26 27  '$ GLUCOSE 139* 99 88  BUN 80* 55* 57*  CREATININE 3.37* 3.47* 3.14*  CALCIUM 9.7 9.9 9.8  PHOS 6.0* 6.8* 6.7*   Liver Function Tests: Recent Labs  Lab 04/24/21 0726 04/25/21 0808 04/28/21 0631  ALBUMIN 2.6* 2.8* 2.7*   CBC: Recent Labs  Lab 04/24/21 0726 04/25/21 0808 04/28/21 0631  WBC 11.3* 10.6* 9.4  HGB 8.7* 9.7* 10.1*  HCT 28.3* 31.5* 31.6*  MCV 101.1* 98.4 96.3  PLT 404* 447* 486*   Studies/Results: MR HIP LEFT WO CONTRAST  Result Date: 04/29/2021 CLINICAL DATA:  Left hip pain.  MRSA bacteremia. EXAM: MR OF THE LEFT HIP WITHOUT CONTRAST TECHNIQUE: Multiplanar, multisequence MR imaging was performed. No intravenous contrast was administered. COMPARISON:  CT scan 04/19/2021 FINDINGS: Both hips are normally located. Small hip joint effusions but no MR findings suspicious for septic arthritis or osteomyelitis involving the hips. The pubic symphysis is intact.  No findings for septic arthritis. There are changes of septic arthritis involving the left SI joint. The joint is slightly  widened and contains fluid. There is also an elongated abscess involving the left iliacus muscle. This courses down along the iliopsoas tendon all the way to the attachment site. Associated abnormal marrow signal in the sacrum and iliac bone consistent with osteomyelitis. Is also a small abscess involving the left piriformis muscle and surrounding myofasciitis. The bladder is unremarkable. The rectum and sigmoid colon grossly normal. Prostate gland and seminal vesicles are normal. Small bilateral inguinal lymph nodes and bilateral enlarged obturator region lymph nodes. These are likely reactive/inflammatory. Mild edema like signal changes in the right gluteus maximus muscle suggesting myositis. IMPRESSION: 1. MR findings consistent with septic arthritis involving the left SI joint. Associated osteomyelitis involving the sacrum and iliac bone. 2. Elongated abscess involving the left iliacus muscle. 3. Small abscess involving the left piriformis muscle and surrounding myofasciitis. 4. No findings for septic arthritis or osteomyelitis involving the hips. Electronically Signed   By: Marijo Sanes M.D.   On: 04/29/2021 17:59    Medications:  sodium chloride Stopped (04/23/21 0830)   anticoagulant sodium citrate     anticoagulant sodium citrate      ceFAZolin (ANCEF) IV 1 g (04/29/21 1836)    allopurinol  100 mg Oral Daily   amiodarone  200 mg Oral Daily   apixaban  5 mg Oral BID   bisacodyl  10 mg Rectal Once   Chlorhexidine Gluconate Cloth  6 each Topical Daily   darbepoetin (ARANESP) injection - DIALYSIS  150 mcg Intravenous Q Fri-HD   lanthanum  500 mg Oral TID WC   linaclotide  145 mcg Oral QAC breakfast   LORazepam  1 mg Intravenous Once   midodrine  20 mg Oral TID WC   sorbitol  30 mL Oral Once    Dialysis Orders: TTS/ AKI [place] [facility] 4h  400/800    188.5kg   2/2 bath  TDC RIJ  Hep none  - no in center meds  Assessment/Plan: MSSA bacteremia: Blood Cx 9/22 + and catheter exit site  purulence. S/p [facility] removal/line holiday 9/24 -> 9/28 with new TDC placed at that time.  On IV Cefazolin x 6 week course (thru 11/5). Hip pain -> sacral/iliac bone osteomyelitis/septic arthritis: With 2 small abscess on MRI, ?drainable with IR. Defer to primary and ID. Dyspnea: On admit, improved and dry weight gradually being lowered.  ESRD: on HD TTS, refusing at times -> HD today. 3L UFG. History of biventricular congestive heart failure: Not a candidate for LVAD or transplant, per notes. Hypotension: Stable on midodrine '20mg'$  TID. Atrial fibrillation: On amiodarone + Eliquis. CKD MBD: COrrCa high, Phos high -> increase Fosrenol binders to '1000mg'$  TID and check PTH. Anemia of chronic kidney disease: Hgb 10.5 - continue Aranesp 162mg weekly.    KVeneta Penton PA-C 04/30/2021, 8:10 AM  [employer]

## 2021-04-30 NOTE — Progress Notes (Signed)
IR received request to assess for perc drainage of pelvic/iliacus abscess. MR hip and previous non-contrasted CT reviewed by Dr. Dwaine Gale. Abscess is small and very deep, especially given pt size. Would recommend CT pelvis with IV contrast only to further evaluate abscess and surroundings, particularly bowel, which may or may not prohibit safe percutaneous access. Made NPO p MN in the event abscess amenable to drainage. IR will follow up after CT, if/when completed   Ascencion Dike PA-C Interventional Radiology 04/30/2021 4:38 PM

## 2021-04-30 NOTE — Consult Note (Signed)
Reason for Consult:Pelvic infection Referring Physician: Niel Hummer Time called: [id number] Time at bedside: [place] is an 35 y.o. male.  HPI: Phillip Young was admitted 3 weeks ago with SOB and polyarthralgia. He was found to have bacteremia 2/2 infected HD cath. About a week ago he began to have left hip and back pain severe enough that he's been unable to ambulate. He denies prior hx/o similar or fevers, chills, sweats, N/V. MRI showed left SI septic arthritis, iliacus abscess, and pelvic osteo and orthopedic surgery was consulted.  Past Medical History:  Diagnosis Date   Biventricular congestive heart failure ([place])    Last Echo 11/2019 at [facility] reveals EF 20%   Class 3 severe obesity due to excess calories with serious comorbidity and body mass index (BMI) of 50.0 to 59.9 in adult [facility]) 02/26/2020   Essential hypertension 02/26/2020   GERD without esophagitis 02/26/2020   Hidradenitis suppurativa 02/26/2020   OSA (obstructive sleep apnea) 02/26/2020   Prediabetes 02/26/2020   Renal disorder     Past Surgical History:  Procedure Laterality Date   ABSCESS DRAINAGE     IR FLUORO GUIDE CV LINE RIGHT  03/10/2021   IR FLUORO GUIDE CV LINE RIGHT  04/22/2021   IR US GUIDE VASC ACCESS RIGHT  03/10/2021   IR US GUIDE VASC ACCESS RIGHT  04/22/2021   RIGHT HEART CATH N/A 03/06/2021   Procedure: RIGHT HEART CATH;  Surgeon: Jolaine Artist, MD;  Location: [place] [facility];  Service: Cardiovascular;  Laterality: N/A;   RIGHT/LEFT HEART CATH AND CORONARY ANGIOGRAPHY N/A 03/04/2020   Procedure: RIGHT/LEFT HEART CATH AND CORONARY ANGIOGRAPHY;  Surgeon: Larey Dresser, MD;  Location: [place] [facility];  Service: Cardiovascular;  Laterality: N/A;   TEMPORARY DIALYSIS CATHETER  03/06/2021   Procedure: TEMPORARY DIALYSIS CATHETER;  Surgeon: Jolaine Artist, MD;  Location: [place] [facility];  Service: Cardiovascular;;    Family History  Problem Relation Age of Onset   Heart  disease Mother    Hypertension Mother    Pulmonary Hypertension Mother    Drug abuse Father        died due to Heroin overdose    Social History:  reports that he quit smoking about 3 years ago. His smoking use included cigarettes. He smoked an average of 1 pack per day. He has quit using smokeless tobacco. He reports that he does not drink alcohol and does not use drugs.  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  Results for orders placed or performed during the hospital encounter of 04/12/21 (from the past 48 hour(s))  CBC     Status: Abnormal   Collection Time: 04/30/21  8:17 AM  Result Value Ref Range   WBC 8.6 4.0 - 10.5 K/uL   RBC 3.13 (L) 4.22 - 5.81 MIL/uL   Hemoglobin 9.6 (L) 13.0 - 17.0 g/dL   HCT 30.4 (L) 39.0 - 52.0 %   MCV 97.1 80.0 - 100.0 fL   MCH 30.7 26.0 - 34.0 pg   MCHC 31.6 30.0 - 36.0 g/dL   RDW 17.9 (H) 11.5 - 15.5 %   Platelets 482 (H) 150 - 400 K/uL   nRBC 0.0 0.0 - 0.2 %    Comment: Performed at  [facility], [place] [address] [place], [place] [address]  Renal function panel     Status: Abnormal   Collection Time: 04/30/21  8:17 AM  Result Value Ref Range   Sodium 131 (L) 135 - 145 mmol/L   Potassium 4.9 3.5 - 5.1 mmol/L   Chloride 93 (L) 98 - 111 mmol/L   CO2 27 22 - 32 mmol/L   Glucose, Bld 117 (H) 70 - 99 mg/dL    Comment: Glucose reference range applies only to samples taken after fasting for at least 8 hours.   BUN 53 (H) 6 - 20 mg/dL   Creatinine, Ser 3.03 (H) 0.61 - 1.24 mg/dL   Calcium 9.9 8.9 - 10.3 mg/dL   Phosphorus 5.0 (H) 2.5 - 4.6 mg/dL   Albumin 2.6 (L) 3.5 - 5.0 g/dL   GFR, Estimated 26 (L) >60 mL/min    Comment: (NOTE) Calculated using the CKD-EPI Creatinine Equation (2021)    Anion gap 11 5 - 15     Comment: Performed at [facility] [address], [place], [place] [address]    MR HIP LEFT WO CONTRAST  Result Date: 04/29/2021 CLINICAL DATA:  Left hip pain.  MRSA bacteremia. EXAM: MR OF THE LEFT HIP WITHOUT CONTRAST TECHNIQUE: Multiplanar, multisequence MR imaging was performed. No intravenous contrast was administered. COMPARISON:  CT scan 04/19/2021 FINDINGS: Both hips are normally located. Small hip joint effusions but no MR findings suspicious for septic arthritis or osteomyelitis involving the hips. The pubic symphysis is intact.  No findings for septic arthritis. There are changes of septic arthritis involving the left SI joint. The joint is slightly widened and contains fluid. There is also an elongated abscess involving the left iliacus muscle. This courses down along the iliopsoas tendon all the way to the attachment site. Associated abnormal marrow signal in the sacrum and iliac bone consistent with osteomyelitis. Is also a small abscess involving the left piriformis muscle and surrounding myofasciitis. The bladder is unremarkable. The rectum and sigmoid colon grossly normal. Prostate gland and seminal vesicles are normal. Small bilateral inguinal lymph nodes and bilateral enlarged obturator region lymph nodes. These are likely reactive/inflammatory. Mild edema like signal changes in the right gluteus maximus muscle suggesting myositis. IMPRESSION: 1. MR findings consistent with septic arthritis involving the left SI joint. Associated osteomyelitis involving the sacrum and iliac bone. 2. Elongated abscess involving the left iliacus muscle. 3. Small abscess involving the left piriformis muscle and surrounding myofasciitis. 4. No findings for septic arthritis or osteomyelitis involving the hips. Electronically Signed   By: Marijo Sanes M.D.   On: 04/29/2021 17:59    Review of Systems  Constitutional:  Negative for chills, diaphoresis and fever.  HENT:  Negative for ear discharge, ear  pain, hearing loss and tinnitus.   Eyes:  Negative for photophobia and pain.  Respiratory:  Negative for cough and shortness of breath.   Cardiovascular:  Negative for chest pain.  Gastrointestinal:  Negative for abdominal pain, nausea and vomiting.  Genitourinary:  Negative for dysuria, flank pain, frequency and urgency.  Musculoskeletal:  Positive for arthralgias (Left hip) and back pain. Negative for myalgias and neck pain.  Neurological:  Negative for dizziness and headaches.  Hematological:  Does not bruise/bleed easily.  Psychiatric/Behavioral:  The patient is not nervous/anxious.   Blood pressure 124/69, pulse 82, temperature 98.3 F (36.8 C), temperature source Oral, resp. rate 15, height '5\' 11"'$  (1.803 m), weight (!) 220 kg, SpO2 98 %. Physical Exam  Constitutional:      General: He is not in acute distress.    Appearance: He is well-developed. He is not diaphoretic.  HENT:     Head: Normocephalic and atraumatic.  Eyes:     General: No scleral icterus.       Right eye: No discharge.        Left eye: No discharge.     Conjunctiva/sclera: Conjunctivae normal.  Cardiovascular:     Rate and Rhythm: Normal rate and regular rhythm.  Pulmonary:     Effort: Pulmonary effort is normal. No respiratory distress.  Musculoskeletal:     Cervical back: Normal range of motion.     Comments: Pelvis--no traumatic wounds or rash, no ecchymosis, stable to manual stress, nontender  LLE No traumatic wounds, ecchymosis, or rash  Nontender, mild back pain with PROM hip, severe hip/back pain with AROM hip  No obvioius knee or ankle effusion  Knee stable to varus/ valgus and anterior/posterior stress  Sens DPN, SPN, TN intact  Motor EHL, ext, flex, evers 5/5  DP 1+, PT 0, No significant edema  Skin:    General: Skin is warm and dry.  Neurological:     Mental Status: He is alert.  Psychiatric:        Mood and Affect: Mood normal.        Behavior: Behavior normal.     Assessment/Plan: Pelvic infection -- Pt extremely high risk for any type of surgery and thus would not recommend anything at this time. Would consult GS for second opinion and IR to see if they think percutaneous drainage of abscess would be beneficial. Would get IR back on board as pt will need multiple weeks of IV abx to try and clear bone infections. Morbidity associated with SI resection would be substantial, hemipelvectomy unthinkable.    Phillip Abu, PA-C Orthopedic Surgery [phone] 04/30/2021, 1:25 PM

## 2021-04-30 NOTE — Progress Notes (Signed)
PROGRESS NOTE    Phillip Young  [id number] DOB: 1986/04/16 DOA: 04/12/2021 PCP: [name], MD   Brief Narrative: 35 year old with past medical history significant for severe biventricular systolic heart failure with known ejection fraction less than 20%, diabetes type 2, paroxysmal a flutter on Eliquis, recently started on hemodialysis with recent extensive hospitalization with cardiogenic shock and cardiorenal syndrome progressed to ESRD presented back to the emergency room with shortness of breath, left wrist and right knee pain, unable to ambulate.  Home oxygen concentrator not working.  Chest x-ray showed pulmonary edema, COVID-19 negative.  Patient was found to have MSSA bacteremia due to infected dialysis catheter.  ID and nephrology consulted.  After catheter holiday he received new permanent cath on 9/28 and restarted on hemodialysis. Patient has been declining treatments for the last week on and off.  He now has agreed to dialysis and MRI to evaluate hip pain.   Assessment & Plan:   Principal Problem:   MSSA bacteremia Active Problems:   Morbid obesity ([place])   GERD without esophagitis   OSA (obstructive sleep apnea)   Chronic systolic CHF (congestive heart failure) ([name])   Paroxysmal atrial flutter ([name])   Hyponatremia   ESRD (end stage renal disease) on dialysis ([place])   Acute on chronic respiratory failure with hypoxia ([name])   Medical non-compliance   Hypotension   ESRD on dialysis [facility])   Palliative care by specialist   Full code status   Concern about end of life   1-MSSA Bacteremia; Arthralgia;  Possible septic arthritis unable to prove it.  CT left Hip 9/25 destructive changes.  CT scan right knee no acute finding.  4- out of 4 Blood culture 9/22. Positive for MSSA.  ECHO; no evidence of vegetation.  Perm cath removed ID consulted, recommend IV antibiotics for 6 weeks. Until Nov 5th.  Awaiting MRI results  On pain medication: dilaudid and Oxy.   Refused TEE initially, cardiology wants  more volume removed prior to TEE Repeated Blood cultures negative Cardiology planning TEE on Monday.   Left SI septic arthritis, sacrum and iliac bone osteomyelitis elongated abscess involving the left iliacus muscle, small abscess left piriformis muscle.  -Ortho consulted. Patient is too high risk for surgery. Will ask General surgery and IR to evaluate, does patient benefit from percutaneous drainage.  -ID informed of MRI results.   Acute on chronic respiratory failure with hypoxemia and hypercapnia: Due to malfunctioning concentrator at home now with fluid overload Volume managed with hemodialysis. Hemodialysis yesterday due to pain , agreed to get dialysis today  ESRD on hemodialysis: Had a new PermCath He has been declining dialysis on and off. Was evaluated by palliative care, patient agreed to have hemodialysis and further treatments.   Biventricular heart failure, paroxysmal A. fib, hypotension: Action fraction less than 20%.  Followed by heart failure team.  Intolerance to advanced heart failure therapies.  On midodrine 20 3 times daily Volume managed with hemodialysis. On amiodarone for paroxysmal A. fib.  Morbid obesity: BMI more than 50. On Semaglutide.  Hyponatremia; volume manage with HD.   Constipation; Sorbitol ordered     Estimated body mass index is 67.64 kg/m as calculated from the following:   Height as of this encounter: '5\' 11"'$  (1.803 m).   Weight as of this encounter: 220 kg.   DVT prophylaxis: Eliquis Code Status: Full Code Family Communication:  Disposition Plan:  Status is: Inpatient  Remains inpatient appropriate because:IV treatments appropriate due to intensity of illness or inability  to take PO  Dispo: The patient is from: Home              Anticipated d/c is to: SNF              Patient currently is not medically stable to d/c.   Difficult to place patient No        Consultants:   ID Nephrology   Procedures:  HD  Antimicrobials:  Cefazolin   Subjective: He is still having pain.  I informed him results of MRI  Objective: Vitals:   04/30/21 1100 04/30/21 1130 04/30/21 1137 04/30/21 1146  BP: 120/74 136/79 (!) 145/76 124/69  Pulse: 76 78 83 82  Resp:    15  Temp:    98.3 F (36.8 C)  TempSrc:    Oral  SpO2:    98%  Weight:    (!) 220 kg  Height:        Intake/Output Summary (Last 24 hours) at 04/30/2021 1544 Last data filed at 04/30/2021 1146 Gross per 24 hour  Intake 356 ml  Output 3235 ml  Net -2879 ml    Filed Weights   04/25/21 1141 04/30/21 0759 04/30/21 1146  Weight: (!) 183.6 kg (!) 222.3 kg (!) 220 kg    Examination:  General exam: NAD Respiratory system: CTA Cardiovascular system: S 1, S  2 RRR Gastrointestinal system: BS present, soft, nt Central nervous system: alert Extremities: trace edema   Data Reviewed: I have personally reviewed following labs and imaging studies  CBC: Recent Labs  Lab 04/24/21 0726 04/25/21 0808 04/28/21 0631 04/30/21 0817  WBC 11.3* 10.6* 9.4 8.6  HGB 8.7* 9.7* 10.1* 9.6*  HCT 28.3* 31.5* 31.6* 30.4*  MCV 101.1* 98.4 96.3 97.1  PLT 404* 447* 486* 482*    Basic Metabolic Panel: Recent Labs  Lab 04/24/21 0726 04/25/21 0808 04/28/21 0631 04/30/21 0817  NA 132* 129* 129* 131*  K 5.0 5.5* 5.0 4.9  CL 92* 92* 93* 93*  CO2 '27 26 27 27  '$ GLUCOSE 139* 99 88 117*  BUN 80* 55* 57* 53*  CREATININE 3.37* 3.47* 3.14* 3.03*  CALCIUM 9.7 9.9 9.8 9.9  PHOS 6.0* 6.8* 6.7* 5.0*    GFR: Estimated Creatinine Clearance: 63.5 mL/min (A) (by C-G formula based on SCr of 3.03 mg/dL (H)). Liver Function Tests: Recent Labs  Lab 04/24/21 0726 04/25/21 0808 04/28/21 0631 04/30/21 0817  ALBUMIN 2.6* 2.8* 2.7* 2.6*    No results for input(s): LIPASE, AMYLASE in the last 168 hours. No results for input(s): AMMONIA in the last 168 hours. Coagulation Profile: No results for input(s): INR, PROTIME  in the last 168 hours.  Cardiac Enzymes: No results for input(s): CKTOTAL, CKMB, CKMBINDEX, TROPONINI in the last 168 hours. BNP (last 3 results) No results for input(s): PROBNP in the last 8760 hours. HbA1C: No results for input(s): HGBA1C in the last 72 hours. CBG: No results for input(s): GLUCAP in the last 168 hours. Lipid Profile: No results for input(s): CHOL, HDL, LDLCALC, TRIG, CHOLHDL, LDLDIRECT in the last 72 hours. Thyroid Function Tests: No results for input(s): TSH, T4TOTAL, FREET4, T3FREE, THYROIDAB in the last 72 hours. Anemia Panel: No results for input(s): VITAMINB12, FOLATE, FERRITIN, TIBC, IRON, RETICCTPCT in the last 72 hours. Sepsis Labs: No results for input(s): PROCALCITON, LATICACIDVEN in the last 168 hours.  No results found for this or any previous visit (from the past 240 hour(s)).       Radiology Studies: MR HIP LEFT WO CONTRAST  Result Date: 04/29/2021 CLINICAL DATA:  Left hip pain.  MRSA bacteremia. EXAM: MR OF THE LEFT HIP WITHOUT CONTRAST TECHNIQUE: Multiplanar, multisequence MR imaging was performed. No intravenous contrast was administered. COMPARISON:  CT scan 04/19/2021 FINDINGS: Both hips are normally located. Small hip joint effusions but no MR findings suspicious for septic arthritis or osteomyelitis involving the hips. The pubic symphysis is intact.  No findings for septic arthritis. There are changes of septic arthritis involving the left SI joint. The joint is slightly widened and contains fluid. There is also an elongated abscess involving the left iliacus muscle. This courses down along the iliopsoas tendon all the way to the attachment site. Associated abnormal marrow signal in the sacrum and iliac bone consistent with osteomyelitis. Is also a small abscess involving the left piriformis muscle and surrounding myofasciitis. The bladder is unremarkable. The rectum and sigmoid colon grossly normal. Prostate gland and seminal vesicles are normal.  Small bilateral inguinal lymph nodes and bilateral enlarged obturator region lymph nodes. These are likely reactive/inflammatory. Mild edema like signal changes in the right gluteus maximus muscle suggesting myositis. IMPRESSION: 1. MR findings consistent with septic arthritis involving the left SI joint. Associated osteomyelitis involving the sacrum and iliac bone. 2. Elongated abscess involving the left iliacus muscle. 3. Small abscess involving the left piriformis muscle and surrounding myofasciitis. 4. No findings for septic arthritis or osteomyelitis involving the hips. Electronically Signed   By: Marijo Sanes M.D.   On: 04/29/2021 17:59        Scheduled Meds:  allopurinol  100 mg Oral Daily   amiodarone  200 mg Oral Daily   apixaban  5 mg Oral BID   bisacodyl  10 mg Rectal Once   Chlorhexidine Gluconate Cloth  6 each Topical Daily   darbepoetin (ARANESP) injection - DIALYSIS  150 mcg Intravenous Q Thu-HD   lanthanum  1,000 mg Oral TID WC   linaclotide  145 mcg Oral QAC breakfast   LORazepam  1 mg Intravenous Once   midodrine  20 mg Oral TID WC   sorbitol  30 mL Oral Once   Continuous Infusions:  sodium chloride Stopped (04/23/21 0830)   anticoagulant sodium citrate      ceFAZolin (ANCEF) IV 1 g (04/29/21 1836)     LOS: 14 days    Time spent: 35 minutes.     Elmarie Shiley, MD [facility]   If 7PM-7AM, please contact night-coverage [email or web address]  04/30/2021, 3:44 PM

## 2021-05-01 DIAGNOSIS — B9561 Methicillin susceptible Staphylococcus aureus infection as the cause of diseases classified elsewhere: Secondary | ICD-10-CM | POA: Diagnosis not present

## 2021-05-01 DIAGNOSIS — R7881 Bacteremia: Secondary | ICD-10-CM | POA: Diagnosis not present

## 2021-05-01 LAB — PARATHYROID HORMONE, INTACT (NO CA): PTH: 42 pg/mL (ref 15–65)

## 2021-05-01 MED ORDER — SORBITOL 70 % SOLN: 30.0000 mL | Freq: Once | Status: DC

## 2021-05-01 NOTE — Progress Notes (Signed)
[facility] for Infectious Disease  Date of Admission:  04/12/2021      Total days of antibiotics 15   Cefazolin           ASSESSMENT: Phillip Young is a 35 y.o. male (newly ESRD on HD T/Th/Sat) with MSSA bacteremia in the setting of infected HD line (purulence at site).  Found to have metastatic infection involving the left SI joint septic arthritis/osteomyelitis with small intramuscular abscesses.  Lt SI joint SA/OM with intramuscular abscesses - Given small sizes of the abscess and new HD patient (concern for using IV contrast here as recommended for perc drainage), discussions amongst his primary and consulting teams determine not worth the benefit of draining them. Agree - if ortho and IR feel there will be no therapeutic benefit to drainage would not drain only for diagnostic information. He has disseminated MSSA infection and certainly would not expect any other pathogen here. ay need pills after depending on clinical response. Will need follow up in ID clinic. Continue Cefazolin 2gm IV post HD sessions for 6 weeks.   Bacteremia - Line removed 9/24 >> new line in 9/28 after BSI resolved.   Low EF with biventricular HF history - following with AHF team. No candidate for LVAD or transplant given BMI and co morbidities. Scheduled for TEE 05/04/21.   CKD5 with new hemodialysis - management per primary team.     PLAN: Cefazolin x 6 weeks, 9/25 >> 11/06 post HD Will arrange for EOT visit with ID.  Recommended weekly labs with HD - CBC w/ diff, BMP, CRP, ESR faxed to [phone] ID OPAT team.  Baseline inflammatory markers in AM for therapeutic monitoring    Principal Problem:   MSSA bacteremia Active Problems:   Morbid obesity ([place])   GERD without esophagitis   OSA (obstructive sleep apnea)   Chronic systolic CHF (congestive heart failure) ([name])   Paroxysmal atrial flutter ([name])   Hyponatremia   ESRD (end stage renal disease) on dialysis ([place])   Acute on  chronic respiratory failure with hypoxia ([name])   Medical non-compliance   Hypotension   ESRD on dialysis [facility])   Palliative care by specialist   Full code status   Concern about end of life    allopurinol  100 mg Oral Daily   amiodarone  200 mg Oral Daily   apixaban  5 mg Oral BID   bisacodyl  10 mg Rectal Once   Chlorhexidine Gluconate Cloth  6 each Topical Daily   darbepoetin (ARANESP) injection - DIALYSIS  150 mcg Intravenous Q Thu-HD   lanthanum  1,000 mg Oral TID WC   linaclotide  145 mcg Oral QAC breakfast   LORazepam  1 mg Intravenous Once   midodrine  20 mg Oral TID WC   polyethylene glycol  17 g Oral BID   sorbitol  30 mL Oral Once    SUBJECTIVE: Hip pain flared up again going to the bathroom. Wondering when he can go back to work. Has been on medical leave for other reasons since July 2022.   No fevers or chills. Was noted to be refusing scans and procedures earlier today but presently says "he will play ball with [place] and do what he needs to do."    Review of Systems: Review of Systems  Constitutional:  Negative for chills, fever, malaise/fatigue and weight loss.  HENT:  Negative for sore throat.   Respiratory:  Negative for cough and sputum production.  Cardiovascular:  Negative for chest pain and leg swelling.  Gastrointestinal:  Negative for abdominal pain, diarrhea and vomiting.  Genitourinary:  Negative for dysuria and flank pain.  Musculoskeletal:  Positive for back pain and joint pain. Negative for myalgias and neck pain.  Skin:  Negative for rash.  Neurological:  Negative for dizziness, tingling and headaches.  Psychiatric/Behavioral:  Negative for depression and substance abuse. The patient is not nervous/anxious and does not have insomnia.     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   Physical Exam Vitals reviewed.  Constitutional:      Appearance: He is well-developed. He is obese.     Comments: Resting in bed.  HENT:     Mouth/Throat:     Dentition: Normal dentition. No dental abscesses.  Cardiovascular:     Rate and Rhythm: Normal rate and regular rhythm.     Heart sounds: Normal heart sounds.  Pulmonary:     Effort: Pulmonary effort is normal.     Breath sounds: Normal breath sounds.     Comments: Wearing nasal cannula breathing comfortably in no distress Abdominal:     General: There is no distension.     Palpations: Abdomen is soft.     Tenderness: There is no abdominal tenderness.  Lymphadenopathy:     Cervical: No cervical adenopathy.  Skin:    General: Skin is warm and dry.     Findings: No rash.  Neurological:     Mental Status: He is alert and oriented to person, place, and time.  Psychiatric:        Judgment: Judgment normal.    Lab Results Lab Results  Component Value Date   WBC 8.6 04/30/2021   HGB 9.6 (L) 04/30/2021   HCT 30.4 (L) 04/30/2021   MCV 97.1 04/30/2021   PLT 482 (H) 04/30/2021    Lab Results  Component Value Date   CREATININE 3.03 (H) 04/30/2021   BUN 53 (H) 04/30/2021   NA 131 (L) 04/30/2021   K 4.9 04/30/2021   CL 93 (L) 04/30/2021   CO2 27 04/30/2021    Lab Results  Component Value Date   ALT 32 04/12/2021   AST 36 04/12/2021   ALKPHOS 84 04/12/2021   BILITOT 1.2 04/12/2021     Microbiology: No results found for this or any previous visit (from the past 240 hour(s)).  Janene Madeira, MSN, NP-C [facility] for [facility] [place] Pager: [phone]  05/01/2021  11:51 AM

## 2021-05-01 NOTE — Progress Notes (Signed)
[facility] Progress Note   Subjective:  Seen in room, no c/o today  Objective Vitals:   04/30/21 2300 04/30/21 2352 05/01/21 0000 05/01/21 0100  BP: 113/70 113/70 119/81 106/72  Pulse:      Resp: (!) 24 18 (!) 22 (!) 21  Temp:      TempSrc:      SpO2:      Weight:      Height:       Physical Exam General: Obese man, NAD. Nasal O2 in place. Heart: RRR; no murmur Lungs: CTA anteriorly Abdomen: soft, non-tender Extremities: No LE edema Dialysis Access:  TDC in R chest  Additional Objective Labs: Basic Metabolic Panel: Recent Labs  Lab 04/25/21 0808 04/28/21 0631 04/30/21 0817  NA 129* 129* 131*  K 5.5* 5.0 4.9  CL 92* 93* 93*  CO2 '26 27 27  '$ GLUCOSE 99 88 117*  BUN 55* 57* 53*  CREATININE 3.47* 3.14* 3.03*  CALCIUM 9.9 9.8 9.9  PHOS 6.8* 6.7* 5.0*    Liver Function Tests: Recent Labs  Lab 04/25/21 0808 04/28/21 0631 04/30/21 0817  ALBUMIN 2.8* 2.7* 2.6*    CBC: Recent Labs  Lab 04/25/21 0808 04/28/21 0631 04/30/21 0817  WBC 10.6* 9.4 8.6  HGB 9.7* 10.1* 9.6*  HCT 31.5* 31.6* 30.4*  MCV 98.4 96.3 97.1  PLT 447* 486* 482*    Studies/Results: MR HIP LEFT WO CONTRAST  Result Date: 04/29/2021 CLINICAL DATA:  Left hip pain.  MRSA bacteremia. EXAM: MR OF THE LEFT HIP WITHOUT CONTRAST TECHNIQUE: Multiplanar, multisequence MR imaging was performed. No intravenous contrast was administered. COMPARISON:  CT scan 04/19/2021 FINDINGS: Both hips are normally located. Small hip joint effusions but no MR findings suspicious for septic arthritis or osteomyelitis involving the hips. The pubic symphysis is intact.  No findings for septic arthritis. There are changes of septic arthritis involving the left SI joint. The joint is slightly widened and contains fluid. There is also an elongated abscess involving the left iliacus muscle. This courses down along the iliopsoas tendon all the way to the attachment site. Associated abnormal marrow signal in the  sacrum and iliac bone consistent with osteomyelitis. Is also a small abscess involving the left piriformis muscle and surrounding myofasciitis. The bladder is unremarkable. The rectum and sigmoid colon grossly normal. Prostate gland and seminal vesicles are normal. Small bilateral inguinal lymph nodes and bilateral enlarged obturator region lymph nodes. These are likely reactive/inflammatory. Mild edema like signal changes in the right gluteus maximus muscle suggesting myositis. IMPRESSION: 1. MR findings consistent with septic arthritis involving the left SI joint. Associated osteomyelitis involving the sacrum and iliac bone. 2. Elongated abscess involving the left iliacus muscle. 3. Small abscess involving the left piriformis muscle and surrounding myofasciitis. 4. No findings for septic arthritis or osteomyelitis involving the hips. Electronically Signed   By: Marijo Sanes M.D.   On: 04/29/2021 17:59    Medications:  sodium chloride Stopped (04/23/21 0830)   anticoagulant sodium citrate      ceFAZolin (ANCEF) IV 1 g (04/30/21 1651)    allopurinol  100 mg Oral Daily   amiodarone  200 mg Oral Daily   apixaban  5 mg Oral BID   bisacodyl  10 mg Rectal Once   Chlorhexidine Gluconate Cloth  6 each Topical Daily   darbepoetin (ARANESP) injection - DIALYSIS  150 mcg Intravenous Q Thu-HD   lanthanum  1,000 mg Oral TID WC   linaclotide  145 mcg Oral QAC breakfast  LORazepam  1 mg Intravenous Once   midodrine  20 mg Oral TID WC   polyethylene glycol  17 g Oral BID   sorbitol  30 mL Oral Once    Dialysis Orders: TTS/ AKI [facility] 4h  400/800    188.5kg   2/2 bath  TDC RIJ  Hep none  - no in center meds  Assessment/Plan: MSSA bacteremia: Blood Cx 9/22 + and catheter exit site purulence. S/p [facility] removal/line holiday 9/24 -> 9/28 with new TDC placed at that time.  On IV Cefazolin x 6 week course (thru 11/5). Has assoc L SI septic athritis w/ some local osteo and 2 associated abscess  formations. Per pmd  Dyspnea: On admit, improved and dry weight gradually being lowered.  ESRD: on HD TTS, refusing HD at times. HD Sat, 3 L UF. History of biventricular congestive heart failure: Not a candidate for LVAD or transplant, per notes. Hypotension: Stable on midodrine '20mg'$  TID. Atrial fibrillation: On amiodarone + Eliquis. CKD MBD: COrrCa high, Phos high -> increase Fosrenol binders to '1000mg'$  TID and check PTH. Anemia of chronic kidney disease: Hgb 10.5 - continue Aranesp 168mg weekly.   RKelly Splinter MD 05/01/2021, 10:56 AM

## 2021-05-01 NOTE — Progress Notes (Signed)
Patient refused to take medications and refused vitals as well. He says he will not have any procedure done today.

## 2021-05-01 NOTE — Progress Notes (Signed)
Physical Therapy Treatment Patient Details Name: Phillip Young MRN: [id number] DOB: 06-13-1986 Today's Date: 05/01/2021   History of Present Illness Pt is a 35 y.o. male admitted 04/12/21 with SOB, hypoxia. Workup for MSSA bacteremia, acute hypoxic respiratory failure, cardiorenal syndrome. Pt had catheter holiday with clearance of bacteremia; pt declined reinsertion of HD catheter 9/27. Course complicated by R knee and L hip pain; pelvic CT negative for acute abnormality. Of note, recent admission 02/11/21-03/26/21 with cardiogenic shock, biventricular HF, progression to ESRD with need for HD. Other PMH includes CHF, DM2, aflutter, ESRD (HD TTS).    PT Comments    The pt was agreeable to participate in OOB mobility this morning with presence of +2 to physically assist. The pt was able to complete rolling and bed mobility with mod-maxA of 2 with increased cues for technique and use of UE on bed rails, he demos little problem solving to complete movements without continued cues/assist. The pt was then able to complete sit-stand from EOB with maxA of 2 through [facility] and increased use of momentum to  power up. The pt reports he is most limited by significant pain in L hip and R knee (L hip > R knee), but was able to complete short steps with RW to [facility]. The pt will continue to benefit from skilled PT acutely, remains limited in mobility progression due to pain and self-limiting due to fear of pain/weakness.    Recommendations for follow up therapy are one component of a multi-disciplinary discharge planning process, led by the attending physician.  Recommendations may be updated based on patient status, additional functional criteria and insurance authorization.  Follow Up Recommendations  SNF;Supervision for mobility/OOB;Other (comment)     Equipment Recommendations  Other (comment) (TBD, pt has rollator)    Recommendations for Other Services       Precautions / Restrictions Precautions Precautions:  Fall Precaution Comments: Watch SpO2 (wears 1L O2 baseline) Restrictions Weight Bearing Restrictions: No     Mobility  Bed Mobility Overal bed mobility: Needs Assistance Bed Mobility: Rolling;Sidelying to Sit Rolling: Min assist Sidelying to sit: Max assist;HOB elevated       General bed mobility comments: pt reporting pain from bed rail, increased time and effort, cues for reaching for bed rail, pt very worried about hurting the therapists    Transfers Overall transfer level: Needs assistance Equipment used: 2 person hand held assist Transfers: Sit to/from [facility] Sit to Stand: Max assist;+2 physical assistance Stand pivot transfers: Max assist;+2 safety/equipment       General transfer comment: maxA with therapists holding pt on arms, use of rocking to power up to standing. increased time to power up and pt needing significant assist, once in standing with BUE on RW, pt needing only minG to steady  Ambulation/Gait Ambulation/Gait assistance: Mod assist;+2 safety/equipment Gait Distance (Feet): 3 Feet Assistive device: Rolling walker (2 wheeled) Gait Pattern/deviations: Step-to pattern;Decreased weight shift to left;Decreased stance time - left;Decreased step length - right;Shuffle Gait velocity: reduced   General Gait Details: pt with minimal wt shift to LLE, increased time and effort, cues to push through BUE support      Balance Overall balance assessment: Needs assistance Sitting-balance support: Feet supported;No upper extremity supported Sitting balance-Leahy Scale: Fair Sitting balance - Comments: patient able to maintain sitting balance but was fearful of falling   Standing balance support: Bilateral upper extremity supported Standing balance-Leahy Scale: Poor Standing balance comment: needs RW for gait, able to leg go and maintain static  stance without UE support                            Cognition Arousal/Alertness:  Awake/alert Behavior During Therapy: Restless;Anxious Overall Cognitive Status: Within Functional Limits for tasks assessed                                 General Comments: patient very demanding, decreased coping skills, scared of the pain and thus trys to control the session and how to move.      Exercises      General Comments General comments (skin integrity, edema, etc.): VSS with 5L O2. drop to low of 82% with good pleth when pt removed O2. HR 100bpm with leads put back on (pt then removed)      Pertinent Vitals/Pain Pain Assessment: Faces Faces Pain Scale: Hurts whole lot Pain Location: left hip, right knee Pain Descriptors / Indicators: Grimacing;Moaning;Guarding;Constant;Discomfort;Sore Pain Intervention(s): Limited activity within patient's tolerance;Monitored during session;Repositioned     PT Goals (current goals can now be found in the care plan section) Acute Rehab PT Goals Patient Stated Goal: to stand up PT Goal Formulation: With patient Time For Goal Achievement: 05/13/21 Potential to Achieve Goals: Fair Progress towards PT goals: Progressing toward goals    Frequency    Min 2X/week      PT Plan Current plan remains appropriate    Co-evaluation PT/OT/SLP Co-Evaluation/Treatment: Yes Reason for Co-Treatment: Necessary to address cognition/behavior during functional activity;For patient/therapist safety;To address functional/ADL transfers PT goals addressed during session: Mobility/safety with mobility;Strengthening/ROM OT goals addressed during session: ADL's and self-care      AM-PAC PT "6 Clicks" Mobility   Outcome Measure  Help needed turning from your back to your side while in a flat bed without using bedrails?: A Lot Help needed moving from lying on your back to sitting on the side of a flat bed without using bedrails?: Total Help needed moving to and from a bed to a chair (including a wheelchair)?: Total Help needed standing  up from a chair using your arms (e.g., wheelchair or bedside chair)?: Total Help needed to walk in hospital room?: Total Help needed climbing 3-5 steps with a railing? : Total 6 Click Score: 7    End of Session Equipment Utilized During Treatment: Oxygen Activity Tolerance: Patient limited by pain Patient left: in chair;with call bell/phone within reach (on [facility] with call bell) Nurse Communication: Mobility status PT Visit Diagnosis: Difficulty in walking, not elsewhere classified (R26.2);Other abnormalities of gait and mobility (R26.89);Pain Pain - Right/Left: Left Pain - part of body: Hip;Knee     Time: 0819-0900 PT Time Calculation (min) (ACUTE ONLY): 41 min  Charges:  $Gait Training: 8-22 mins $Therapeutic Activity: 8-22 mins                     West Carbo, PT, DPT   Acute Rehabilitation Department Pager #: [phone]   Sandra Cockayne 05/01/2021, 11:36 AM

## 2021-05-01 NOTE — Progress Notes (Signed)
PROGRESS NOTE    Phillip Young  [id number] DOB: 11-29-1985 DOA: 04/12/2021 PCP: [name], MD   Brief Narrative: 35 year old with past medical history significant for severe biventricular systolic heart failure with known ejection fraction less than 20%, diabetes type 2, paroxysmal a flutter on Eliquis, recently started on hemodialysis with recent extensive hospitalization with cardiogenic shock and cardiorenal syndrome progressed to ESRD presented back to the emergency room with shortness of breath, left wrist and right knee pain, unable to ambulate.  Home oxygen concentrator not working.  Chest x-ray showed pulmonary edema, COVID-19 negative.  Patient was found to have MSSA bacteremia due to infected dialysis catheter.  ID and nephrology consulted.  After catheter holiday he received new permanent cath on 9/28 and restarted on hemodialysis. Patient has been declining treatments for the last week on and off.  He now has agreed to dialysis and MRI to evaluate hip pain.   Assessment & Plan:   Principal Problem:   MSSA bacteremia Active Problems:   Morbid obesity ([place])   GERD without esophagitis   OSA (obstructive sleep apnea)   Chronic systolic CHF (congestive heart failure) ([name])   Paroxysmal atrial flutter ([name])   Hyponatremia   ESRD (end stage renal disease) on dialysis ([place])   Acute on chronic respiratory failure with hypoxia ([name])   Medical non-compliance   Hypotension   ESRD on dialysis [facility])   Palliative care by specialist   Full code status   Concern about end of life   1-MSSA Bacteremia; Arthralgia;  CT left Hip 9/25 destructive changes.  CT scan right knee no acute finding.  4- out of 4 Blood culture 9/22. Positive for MSSA.  ECHO; no evidence of vegetation.  Perm cath removed ID consulted, recommend IV antibiotics for 6 weeks. Until Nov 6th.  On pain medication: dilaudid and Oxy.  Refused TEE initially, cardiology wants  more volume removed prior to  TEE Repeated Blood cultures negative Cardiology planning TEE on Monday.   Left SI septic arthritis, sacrum and iliac bone osteomyelitis elongated abscess involving the left iliacus muscle, small abscess left piriformis muscle.  -Ortho consulted. Patient is too high risk for surgery. Per IR abscess is small and even with CT scan, it is in a location that may not be amenable for drainage, for this reason and to avoid to give more contrast to patient to prevent worsening to any remain renal function he has left wont proceed with CT scan or procedure. Per ortho there is not significant therapeutic improvement for mobility with drainage of abscess.  -plan to treat with IV antibiotics. He will need Imagine after completion of antibiotics.    Acute on chronic respiratory failure with hypoxemia and hypercapnia: Due to malfunctioning concentrator at home now with fluid overload Volume managed with hemodialysis. Nephrology following.   ESRD on hemodialysis: Had a new PermCath He has been declining dialysis on and off. Was evaluated by palliative care, patient agreed to have hemodialysis and further treatments.   Biventricular heart failure, paroxysmal A. fib, hypotension: Action fraction less than 20%.  Followed by heart failure team.  Intolerance to advanced heart failure therapies.  On midodrine 20 3 times daily Volume managed with hemodialysis. On amiodarone for paroxysmal A. fib.  Morbid obesity: BMI more than 50. On Semaglutide.  Hyponatremia; volume manage with HD.   Constipation; Sorbitol ordered     Estimated body mass index is 67.64 kg/m as calculated from the following:   Height as of this encounter: '5\' 11"'$  (1.803  m).   Weight as of this encounter: 220 kg.   DVT prophylaxis: Eliquis Code Status: Full Code Family Communication:  Disposition Plan:  Status is: Inpatient  Remains inpatient appropriate because:IV treatments appropriate due to intensity of illness or inability  to take PO  Dispo: The patient is from: Home              Anticipated d/c is to: SNF              Patient currently is not medically stable to d/c.   Difficult to place patient No        Consultants:  ID Nephrology   Procedures:  HD  Antimicrobials:  Cefazolin   Subjective: He is willing to work with PT.  He hasnt had BM. He decline enema. Will take oral laxative.   Objective: Vitals:   04/30/21 1943 05/01/21 0000 05/01/21 0100 05/01/21 1152  BP:  119/81 106/72 99/64  Pulse:    84  Resp:  (!) 22 (!) 21 17  Temp: 97.8 F (36.6 C)   98.7 F (37.1 C)  TempSrc: Oral   Oral  SpO2: 98%   97%  Weight:      Height:        Intake/Output Summary (Last 24 hours) at 05/01/2021 1312 Last data filed at 04/30/2021 2230 Gross per 24 hour  Intake 240 ml  Output 300 ml  Net -60 ml    Filed Weights   04/25/21 1141 04/30/21 0759 04/30/21 1146  Weight: (!) 183.6 kg (!) 222.3 kg (!) 220 kg    Examination:  General exam: NAD Respiratory system: CTA Cardiovascular system: S 1, S 2 RRR Gastrointestinal system: BS present, soft, nt, nd Central nervous system: Alert.  Extremities: Trace edmea   Data Reviewed: I have personally reviewed following labs and imaging studies  CBC: Recent Labs  Lab 04/25/21 0808 04/28/21 0631 04/30/21 0817  WBC 10.6* 9.4 8.6  HGB 9.7* 10.1* 9.6*  HCT 31.5* 31.6* 30.4*  MCV 98.4 96.3 97.1  PLT 447* 486* 482*    Basic Metabolic Panel: Recent Labs  Lab 04/25/21 0808 04/28/21 0631 04/30/21 0817  NA 129* 129* 131*  K 5.5* 5.0 4.9  CL 92* 93* 93*  CO2 '26 27 27  '$ GLUCOSE 99 88 117*  BUN 55* 57* 53*  CREATININE 3.47* 3.14* 3.03*  CALCIUM 9.9 9.8 9.9  PHOS 6.8* 6.7* 5.0*    GFR: Estimated Creatinine Clearance: 63.5 mL/min (A) (by C-G formula based on SCr of 3.03 mg/dL (H)). Liver Function Tests: Recent Labs  Lab 04/25/21 0808 04/28/21 0631 04/30/21 0817  ALBUMIN 2.8* 2.7* 2.6*    No results for input(s): LIPASE, AMYLASE  in the last 168 hours. No results for input(s): AMMONIA in the last 168 hours. Coagulation Profile: No results for input(s): INR, PROTIME in the last 168 hours.  Cardiac Enzymes: No results for input(s): CKTOTAL, CKMB, CKMBINDEX, TROPONINI in the last 168 hours. BNP (last 3 results) No results for input(s): PROBNP in the last 8760 hours. HbA1C: No results for input(s): HGBA1C in the last 72 hours. CBG: No results for input(s): GLUCAP in the last 168 hours. Lipid Profile: No results for input(s): CHOL, HDL, LDLCALC, TRIG, CHOLHDL, LDLDIRECT in the last 72 hours. Thyroid Function Tests: No results for input(s): TSH, T4TOTAL, FREET4, T3FREE, THYROIDAB in the last 72 hours. Anemia Panel: No results for input(s): VITAMINB12, FOLATE, FERRITIN, TIBC, IRON, RETICCTPCT in the last 72 hours. Sepsis Labs: No results for input(s): PROCALCITON, LATICACIDVEN in  the last 168 hours.  No results found for this or any previous visit (from the past 240 hour(s)).       Radiology Studies: MR HIP LEFT WO CONTRAST  Result Date: 04/29/2021 CLINICAL DATA:  Left hip pain.  MRSA bacteremia. EXAM: MR OF THE LEFT HIP WITHOUT CONTRAST TECHNIQUE: Multiplanar, multisequence MR imaging was performed. No intravenous contrast was administered. COMPARISON:  CT scan 04/19/2021 FINDINGS: Both hips are normally located. Small hip joint effusions but no MR findings suspicious for septic arthritis or osteomyelitis involving the hips. The pubic symphysis is intact.  No findings for septic arthritis. There are changes of septic arthritis involving the left SI joint. The joint is slightly widened and contains fluid. There is also an elongated abscess involving the left iliacus muscle. This courses down along the iliopsoas tendon all the way to the attachment site. Associated abnormal marrow signal in the sacrum and iliac bone consistent with osteomyelitis. Is also a small abscess involving the left piriformis muscle and  surrounding myofasciitis. The bladder is unremarkable. The rectum and sigmoid colon grossly normal. Prostate gland and seminal vesicles are normal. Small bilateral inguinal lymph nodes and bilateral enlarged obturator region lymph nodes. These are likely reactive/inflammatory. Mild edema like signal changes in the right gluteus maximus muscle suggesting myositis. IMPRESSION: 1. MR findings consistent with septic arthritis involving the left SI joint. Associated osteomyelitis involving the sacrum and iliac bone. 2. Elongated abscess involving the left iliacus muscle. 3. Small abscess involving the left piriformis muscle and surrounding myofasciitis. 4. No findings for septic arthritis or osteomyelitis involving the hips. Electronically Signed   By: Marijo Sanes M.D.   On: 04/29/2021 17:59        Scheduled Meds:  allopurinol  100 mg Oral Daily   amiodarone  200 mg Oral Daily   apixaban  5 mg Oral BID   bisacodyl  10 mg Rectal Once   Chlorhexidine Gluconate Cloth  6 each Topical Daily   darbepoetin (ARANESP) injection - DIALYSIS  150 mcg Intravenous Q Thu-HD   lanthanum  1,000 mg Oral TID WC   linaclotide  145 mcg Oral QAC breakfast   LORazepam  1 mg Intravenous Once   midodrine  20 mg Oral TID WC   polyethylene glycol  17 g Oral BID   sorbitol  30 mL Oral Once   Continuous Infusions:  sodium chloride Stopped (04/23/21 0830)   anticoagulant sodium citrate      ceFAZolin (ANCEF) IV 1 g (04/30/21 1651)     LOS: 15 days    Time spent: 35 minutes.     Elmarie Shiley, MD [facility]   If 7PM-7AM, please contact night-coverage [email or web address]  05/01/2021, 1:12 PM

## 2021-05-01 NOTE — Progress Notes (Signed)
Occupational Therapy Treatment Patient Details Name: Phillip Young MRN: [id number] DOB: August 29, 1985 Today's Date: 05/01/2021   History of present illness Pt is a 35 y.o. male admitted 04/12/21 with SOB, hypoxia. Workup for MSSA bacteremia, acute hypoxic respiratory failure, cardiorenal syndrome. Pt had catheter holiday with clearance of bacteremia; pt declined reinsertion of HD catheter 9/27. Course complicated by R knee and L hip pain; pelvic CT negative for acute abnormality. Of note, recent admission 02/11/21-03/26/21 with cardiogenic shock, biventricular HF, progression to ESRD with need for HD. Other PMH includes CHF, DM2, aflutter, ESRD (HD TTS).   OT comments  Patient seen in conjunction with PT due to body habitus, level of expressed pain, and poor mobility over the past few weeks.  Patient able to complete bed mobility with increased time, effort, and cues with Mod A of two.  Patient stood at [facility] with Max A of two, and eventually shuffle stepped to the [facility] with Mod A.  Very lengthy session for the activity completed.  Patient demonstrates poor coping strategies and appears scared of perceived pain, thus copious amounts of time are needed to allow him to express his needs.  OT can continue to follow in the acute setting, but he will need to demonstrate the ability to participate on a consistent basis.  SNF appears needed post acute, as he does not have Max A of two help at home.     Recommendations for follow up therapy are one component of a multi-disciplinary discharge planning process, led by the attending physician.  Recommendations may be updated based on patient status, additional functional criteria and insurance authorization.    Follow Up Recommendations  SNF    Equipment Recommendations       Recommendations for Other Services      Precautions / Restrictions Precautions Precautions: Fall Precaution Comments: Watch SpO2 (wears 1L O2 baseline) Restrictions Weight Bearing  Restrictions: No       Mobility Bed Mobility Overal bed mobility: Needs Assistance Bed Mobility: Sidelying to Sit   Sidelying to sit: Max assist;HOB elevated         Patient Response: Restless;Anxious  Transfers Overall transfer level: Needs assistance   Transfers: Sit to/from Stand;Stand Pivot Transfers Sit to Stand: Max assist;+2 physical assistance Stand pivot transfers: Max assist;+2 safety/equipment       General transfer comment: states he feels stronger, difficulty weight bearing through L LE    Balance Overall balance assessment: Needs assistance Sitting-balance support: Feet supported;No upper extremity supported Sitting balance-Leahy Scale: Fair     Standing balance support: Bilateral upper extremity supported Standing balance-Leahy Scale: Poor Standing balance comment: needs RW                                                Cognition Arousal/Alertness: Awake/alert Behavior During Therapy: Restless;Anxious Overall Cognitive Status: Within Functional Limits for tasks assessed                                 General Comments: patient very demanding, decreased coping skills, scared of the pain and thus trys to control the session and how to move.                          Pertinent Vitals/ Pain  Pain Assessment: Faces Faces Pain Scale: Hurts whole lot Pain Location: left hip, right knee Pain Descriptors / Indicators: Grimacing;Moaning;Guarding;Constant;Discomfort;Sore Pain Intervention(s): Monitored during session                                                          Frequency  Min 2X/week        Progress Toward Goals  OT Goals(current goals can now be found in the care plan section)  Progress towards OT goals: Progressing toward goals  Acute Rehab OT Goals Patient Stated Goal: to stand up OT Goal Formulation: With patient Time For Goal Achievement:  05/09/21 Potential to Achieve Goals: Good  Plan Discharge plan remains appropriate    Co-evaluation    PT/OT/SLP Co-Evaluation/Treatment: Yes Reason for Co-Treatment: Complexity of the patient's impairments (multi-system involvement);Necessary to address cognition/behavior during functional activity;For patient/therapist safety;To address functional/ADL transfers   OT goals addressed during session: ADL's and self-care      AM-PAC OT "6 Clicks" Daily Activity     Outcome Measure   Help from another person eating meals?: None Help from another person taking care of personal grooming?: A Little Help from another person toileting, which includes using toliet, bedpan, or urinal?: A Lot Help from another person bathing (including washing, rinsing, drying)?: A Lot Help from another person to put on and taking off regular upper body clothing?: A Little Help from another person to put on and taking off regular lower body clothing?: A Lot 6 Click Score: 16    End of Session Equipment Utilized During Treatment: Gait belt;Rolling walker;Oxygen  OT Visit Diagnosis: Unsteadiness on feet (R26.81);Other abnormalities of gait and mobility (R26.89);Muscle weakness (generalized) (M62.81);Pain Pain - Right/Left: Left Pain - part of body: Hip   Activity Tolerance Patient limited by pain   Patient Left Other (comment);with call bell/phone within reach (on [facility])   Nurse Communication Other (comment) (patient to ring when done)        [id number] OT Time Calculation (min): 41 min  Charges: OT General Charges $OT Visit: 1 Visit OT Treatments $Therapeutic Activity: 8-22 mins  05/01/2021  RP, OTR/L  Acute Rehabilitation Services  Office:  [place] 05/01/2021, 9:27 AM

## 2021-05-01 NOTE — Progress Notes (Signed)
Physical Therapy Treatment Patient Details Name: Phillip Young MRN: [id number] DOB: [birth year] Today's Date: 05/01/2021   History of Present Illness Pt is a 35 y.o. male admitted 04/12/21 with SOB, hypoxia. Workup for MSSA bacteremia, acute hypoxic respiratory failure, cardiorenal syndrome. Pt had catheter holiday with clearance of bacteremia; pt declined reinsertion of HD catheter 9/27. Course complicated by R knee and L hip pain; pelvic CT negative for acute abnormality. Of note, recent admission 02/11/21-03/26/21 with cardiogenic shock, biventricular HF, progression to ESRD with need for HD. Other PMH includes CHF, DM2, aflutter, ESRD (HD TTS).    PT Comments    PT asked to return with assist to complete sit-stand transfer and return to bed. However, pt reporting bilateral legs now numb after sitting on BSC for prolonged time, unable to generate any clearance despite maxA of 2, dependent on +2-3 assist to use maximove lift to return to bed. Will continue to benefit from skilled PT to progress established plan of care and continue to address mobility deficits.     Recommendations for follow up therapy are one component of a multi-disciplinary discharge planning process, led by the attending physician.  Recommendations may be updated based on patient status, additional functional criteria and insurance authorization.  Follow Up Recommendations  SNF;Supervision for mobility/OOB;Other (comment)     Equipment Recommendations  Other (comment) (defer to post acute, pt has rollator)    Recommendations for Other Services       Precautions / Restrictions Precautions Precautions: Fall Precaution Comments: Watch SpO2 (wears 1L O2 baseline) Restrictions Weight Bearing Restrictions: No     Mobility  Bed Mobility Overal bed mobility: Needs Assistance Bed Mobility: Rolling Rolling: Min assist Sidelying to sit: Max assist;HOB elevated       General bed mobility comments: pt lowered to bed on  lift, then rolling with bed rails and minA to complete    Transfers Overall transfer level: Needs assistance Equipment used: 2 person hand held assist Transfers: Sit to/from [facility] Sit to Stand: Max assist;+2 physical assistance Stand pivot transfers: Max assist;+2 safety/equipment       General transfer comment: totalA to complete transfer wtih lift, pt unable to achieve any hip lift despite totalA of 2  Ambulation/Gait Ambulation/Gait assistance: Mod assist;+2 safety/equipment Gait Distance (Feet): 3 Feet Assistive device: Rolling walker (2 wheeled) Gait Pattern/deviations: Step-to pattern;Decreased weight shift to left;Decreased stance time - left;Decreased step length - right;Shuffle Gait velocity: reduced   General Gait Details: pt unable to achieve at this time      Balance Overall balance assessment: Needs assistance Sitting-balance support: Feet supported Sitting balance-Leahy Scale: Good Sitting balance - Comments: able to maintain sitting on BSC without supervision   Standing balance support: Bilateral upper extremity supported Standing balance-Leahy Scale: Poor Standing balance comment: needs RW for gait, able to leg go and maintain static stance without UE support                            Cognition Arousal/Alertness: Awake/alert Behavior During Therapy: Restless;Anxious Overall Cognitive Status: Within Functional Limits for tasks assessed                                 General Comments: pt remains demanding, fearful of pain and attempting to control aspects of session that he can. continues to remove leads and monitors      Exercises  General Comments General comments (skin integrity, edema, etc.): VSS with 5L O2. drop to low of 82% with good pleth when pt removed O2. HR 100bpm with leads put back on (pt then removed)      Pertinent Vitals/Pain Pain Assessment: Faces Faces Pain Scale: Hurts even  more Pain Location: left hip, right knee Pain Descriptors / Indicators: Grimacing;Moaning;Guarding;Constant;Discomfort;Sore Pain Intervention(s): Limited activity within patient's tolerance;Monitored during session;Repositioned     PT Goals (current goals can now be found in the care plan section) Acute Rehab PT Goals Patient Stated Goal: to stand up PT Goal Formulation: With patient Time For Goal Achievement: 05/13/21 Potential to Achieve Goals: Fair Progress towards PT goals: Progressing toward goals    Frequency    Min 2X/week      PT Plan Current plan remains appropriate    Co-evaluation PT/OT/SLP Co-Evaluation/Treatment: Yes Reason for Co-Treatment: Necessary to address cognition/behavior during functional activity;For patient/therapist safety;To address functional/ADL transfers PT goals addressed during session: Mobility/safety with mobility;Strengthening/ROM        AM-PAC PT "6 Clicks" Mobility   Outcome Measure  Help needed turning from your back to your side while in a flat bed without using bedrails?: A Lot Help needed moving from lying on your back to sitting on the side of a flat bed without using bedrails?: Total Help needed moving to and from a bed to a chair (including a wheelchair)?: Total Help needed standing up from a chair using your arms (e.g., wheelchair or bedside chair)?: Total Help needed to walk in hospital room?: Total Help needed climbing 3-5 steps with a railing? : Total 6 Click Score: 7    End of Session Equipment Utilized During Treatment: Oxygen Activity Tolerance: Patient limited by pain Patient left: in bed;with nursing/sitter in room Nurse Communication: Mobility status PT Visit Diagnosis: Difficulty in walking, not elsewhere classified (R26.2);Other abnormalities of gait and mobility (R26.89);Pain Pain - Right/Left: Left Pain - part of body: Hip;Knee     Time: [id number] PT Time Calculation (min) (ACUTE ONLY): 13 min  Charges:   $Gait Training: 8-22 mins $Therapeutic Activity: 8-22 mins                     [place] Carbo, PT, DPT   Acute Rehabilitation Department Pager #: [phone]   Sandra Cockayne 05/01/2021, 1:38 PM

## 2021-05-01 NOTE — Plan of Care (Signed)
  Problem: Education: Goal: Knowledge of General Education information will improve Description: Including pain rating scale, medication(s)/side effects and non-pharmacologic comfort measures Outcome: Progressing   Problem: Health Behavior/Discharge Planning: Goal: Ability to manage health-related needs will improve Outcome: Progressing   Problem: Clinical Measurements: Goal: Ability to maintain clinical measurements within normal limits will improve Outcome: Progressing Goal: Will remain free from infection Outcome: Progressing Goal: Diagnostic test results will improve Outcome: Progressing Goal: Respiratory complications will improve Outcome: Progressing Goal: Cardiovascular complication will be avoided Outcome: Progressing   Problem: Activity: Goal: Risk for activity intolerance will decrease Outcome: Progressing   Problem: Nutrition: Goal: Adequate nutrition will be maintained Outcome: Progressing   Problem: Coping: Goal: Level of anxiety will decrease Outcome: Progressing   Problem: Elimination: Goal: Will not experience complications related to bowel motility Outcome: Progressing Goal: Will not experience complications related to urinary retention Outcome: Progressing   Problem: Pain Managment: Goal: General experience of comfort will improve Outcome: Progressing   Problem: Safety: Goal: Ability to remain free from injury will improve Outcome: Progressing   Problem: Skin Integrity: Goal: Risk for impaired skin integrity will decrease Outcome: Progressing   Problem: Education: Goal: Ability to demonstrate management of disease process will improve Outcome: Progressing Goal: Ability to verbalize understanding of medication therapies will improve Outcome: Progressing Goal: Individualized Educational Video(s) Outcome: Progressing   Problem: Activity: Goal: Capacity to carry out activities will improve Outcome: Progressing   Problem: Cardiac: Goal:  Ability to achieve and maintain adequate cardiopulmonary perfusion will improve Outcome: Progressing   Problem: Education: Goal: Knowledge of disease and its progression will improve Outcome: Progressing   Problem: Health Behavior/Discharge Planning: Goal: Ability to manage health-related needs will improve Outcome: Progressing   Problem: Clinical Measurements: Goal: Complications related to the disease process or treatment will be avoided or minimized Outcome: Progressing Goal: Dialysis access will remain free of complications Outcome: Progressing   Problem: Activity: Goal: Activity intolerance will improve Outcome: Progressing   Problem: Fluid Volume: Goal: Fluid volume balance will be maintained or improved Outcome: Progressing   Problem: Nutritional: Goal: Ability to make appropriate dietary choices will improve Outcome: Progressing   Problem: Respiratory: Goal: Respiratory symptoms related to disease process will be avoided Outcome: Progressing   Problem: Self-Concept: Goal: Body image disturbance will be avoided or minimized Outcome: Progressing   Problem: Urinary Elimination: Goal: Progression of disease will be identified and treated Outcome: Progressing

## 2021-05-02 DIAGNOSIS — R7881 Bacteremia: Secondary | ICD-10-CM | POA: Diagnosis not present

## 2021-05-02 DIAGNOSIS — B9561 Methicillin susceptible Staphylococcus aureus infection as the cause of diseases classified elsewhere: Secondary | ICD-10-CM | POA: Diagnosis not present

## 2021-05-02 LAB — RENAL FUNCTION PANEL
Albumin: 2.7 g/dL — ABNORMAL LOW (ref 3.5–5.0)
Anion gap: 9 (ref 5–15)
BUN: 38 mg/dL — ABNORMAL HIGH (ref 6–20)
CO2: 27 mmol/L (ref 22–32)
Calcium: 9.8 mg/dL (ref 8.9–10.3)
Chloride: 91 mmol/L — ABNORMAL LOW (ref 98–111)
Creatinine, Ser: 4.23 mg/dL — ABNORMAL HIGH (ref 0.61–1.24)
GFR, Estimated: 18 mL/min — ABNORMAL LOW (ref >60)
Glucose, Bld: 91 mg/dL (ref 70–99)
Phosphorus: 5.3 mg/dL — ABNORMAL HIGH (ref 2.5–4.6)
Potassium: 4.3 mmol/L (ref 3.5–5.1)
Sodium: 127 mmol/L — ABNORMAL LOW (ref 135–145)

## 2021-05-02 LAB — BASIC METABOLIC PANEL
Anion gap: 12 (ref 5–15)
BUN: 21 mg/dL — ABNORMAL HIGH (ref 6–20)
CO2: 25 mmol/L (ref 22–32)
Calcium: 9.5 mg/dL (ref 8.9–10.3)
Chloride: 93 mmol/L — ABNORMAL LOW (ref 98–111)
Creatinine, Ser: 3.16 mg/dL — ABNORMAL HIGH (ref 0.61–1.24)
GFR, Estimated: 25 mL/min — ABNORMAL LOW (ref >60)
Glucose, Bld: 109 mg/dL — ABNORMAL HIGH (ref 70–99)
Potassium: 4.2 mmol/L (ref 3.5–5.1)
Sodium: 130 mmol/L — ABNORMAL LOW (ref 135–145)

## 2021-05-02 LAB — C-REACTIVE PROTEIN: CRP: 2.7 mg/dL — ABNORMAL HIGH (ref <1.0)

## 2021-05-02 LAB — CBC
HCT: 32.6 % — ABNORMAL LOW (ref 39.0–52.0)
Hemoglobin: 10 g/dL — ABNORMAL LOW (ref 13.0–17.0)
MCH: 29.8 pg (ref 26.0–34.0)
MCHC: 30.7 g/dL (ref 30.0–36.0)
MCV: 97 fL (ref 80.0–100.0)
Platelets: 461 10*3/uL — ABNORMAL HIGH (ref 150–400)
RBC: 3.36 MIL/uL — ABNORMAL LOW (ref 4.22–5.81)
RDW: 17.3 % — ABNORMAL HIGH (ref 11.5–15.5)
WBC: 7.4 10*3/uL (ref 4.0–10.5)
nRBC: 0 % (ref 0.0–0.2)

## 2021-05-02 LAB — SEDIMENTATION RATE: Sed Rate: 118 mm/hr — ABNORMAL HIGH (ref 0–16)

## 2021-05-02 MED ORDER — ANTICOAGULANT SODIUM CITRATE 4% (200MG/5ML) IV SOLN
5.0000 mL | Status: DC | PRN
  Administered 2021-05-02 – 2021-05-16 (×5): 5 mL
  Filled 2021-05-02 (×6): qty 5

## 2021-05-02 NOTE — Progress Notes (Signed)
Daily Progress Note   Patient Name: Phillip Young       Date: 05/02/2021 DOB: [birth year]  Age: 35 y.o. MRN#: [id number] Attending Physician: Elmarie Shiley, MD Primary Care Physician: [name], MD Admit Date: 04/12/2021  Reason for Consultation/Follow-up: Establishing goals of care  Chart review/updates: Patient with MSSA bacteremia in the setting of infected HD catheter. New HD catheter placed 9/28 after blood cultures cleared. He was found to have metastatic infection involving the left SI joint (septic arthritis/osteomyelitis with small intramuscular abscesses. Ortho, IR, and ID have deemed there would not be therapeutic or diagnostic benefit to drainage. TEE on 10/10 showed no evidence of endocarditis.  Today's Discussion: At bedside, patient reports that his pain is present but has improved to a more tolerable level. He is is glad the medical team has found the reason/cause for his pain (septic arthritis). He reports constipation has resolved - he is having regular bowel movements but they are often hard. Discussed adding a stool softener.  Will reports feeling discouraged because he remains non-ambulatory and spends most of his time in the bed. He feels the staff does not make great efforts to assist him with mobility, and he attribute this mostly to his weight.  We reviewed his goals of care. Will continues to want full code status and to receive full scope treatment, stating "I want to live as long as possible".  We discussed the "what ifs" regarding his current medical condition and what he considers to be acceptable quality of life. Will is open to all life-prolonging interventions offered. I asked him if he was able/willing to set any limits around his care in the event of  cardiopulmonary arrest - such as would he want a trach and/or PEG? Will does indicate he would not want his life prolonged if he had significant neurologic impairments and was not able to meaningfully interact with his family.    Length of Stay: 16   Physical Exam Vitals reviewed.  Constitutional:      General: He is not in acute distress.    Appearance: He is morbidly obese. He is ill-appearing.  Pulmonary:     Effort: Pulmonary effort is normal.  Neurological:     Mental Status: He is alert and oriented to person, place, and time.     Motor: Weakness  present.            Vital Signs: BP 115/69 (BP Location: Right Wrist)   Pulse 85   Temp 98 F (36.7 C) (Oral)   Resp 20   Ht '5\' 11"'$  (1.803 m)   Wt (!) 220 kg   SpO2 99%   BMI 67.64 kg/m  SpO2: SpO2: 99 % O2 Device: O2 Device: Nasal Cannula O2 Flow Rate: O2 Flow Rate (L/min): 5 L/min  Intake/output summary:  Intake/Output Summary (Last 24 hours) at 05/02/2021 1123 Last data filed at 05/02/2021 0900 Gross per 24 hour  Intake 1490 ml  Output 300 ml  Net 1190 ml   LBM: Last BM Date: 05/01/21 Baseline Weight: Weight: (!) 189.4 kg Most recent weight: Weight: (!) 220 kg       Palliative Assessment/Data: PPS 30-40%        Palliative Care Assessment & Plan   Patient Profile: 35 y.o. male  with past medical history of severe biventricular CHF with EF < 20%, DM type 2, paroxysmal atrial flutter on Eliquis, OSA, morbid obesity, and ESRD on HD. He presented to the emergency department on 04/12/2021 with shortness of breath for the past 5-6 days. He noted his O2 saturation to be 78% at home, and was questioning whether his home O2 concentrator was working properly. O2 saturation improved as soon as EMS placed him on oxygen. In the ED, chest x-ray showed pulmonary edema. BNP 658. He was admitted to [facility] with chronic respiratory failure.  He was also found to have MSSA bacteremia, likely from HD catheter which was subsequently  removed.    Patient was hospitalized 02/11/21 - 03/26/21 with cardiogenic shock and cardiorenal syndrome with progressed to ESRD. He initially required CRRT, but was discharged on TTS dialysis.     Assessment: MSSA bacteremia Chronic systolic CHF, fluid overload ESRD on HD Acute on chronic respiratory failure with hypoxia OSA Septic arthritis Constipation  Recommendations/Plan: Full code full scope He does indicate he would not want his life prolonged in the event of significant neurologic impairments Continue linzess 145 mcg daily for constipation Start colace 100 mg BID PMT will continue to follow intermittently  Goals of Care and Additional Recommendations: Limitations on Scope of Treatment: Full Scope Treatment  Code Status: Full  Prognosis:  Unable to determine  Discharge Planning: To Be Determined    Thank you for allowing the Palliative Medicine Team to assist in the care of this patient.   Total Time 35 minutes Prolonged Time Billed  no       Greater than 50%  of this time was spent counseling and coordinating care related to the above assessment and plan.  Lavena Bullion, NP  Please contact Palliative Medicine Team phone at [phone] for questions and concerns.

## 2021-05-02 NOTE — Progress Notes (Signed)
RT Note: Pt declined BiPAP for tonight stating " it gives me cluster headaches". I told him if he changes his mind to call RT

## 2021-05-02 NOTE — Progress Notes (Signed)
PROGRESS NOTE    Phillip Young  [id number] DOB: 1985/09/08 DOA: 04/12/2021 PCP: [name], MD   Brief Narrative: 35 year old with past medical history significant for severe biventricular systolic heart failure with known ejection fraction less than 20%, diabetes type 2, paroxysmal a flutter on Eliquis, recently started on hemodialysis with recent extensive hospitalization with cardiogenic shock and cardiorenal syndrome progressed to ESRD presented back to the emergency room with shortness of breath, left wrist and right knee pain, unable to ambulate.  Home oxygen concentrator not working.  Chest x-ray showed pulmonary edema, COVID-19 negative.  Patient was found to have MSSA bacteremia due to infected dialysis catheter.  ID and nephrology consulted.  After catheter holiday he received new permanent cath on 9/28 and restarted on hemodialysis. Patient has been declining treatments for the last week on and off.  He now has agreed to dialysis and MRI to evaluate hip pain.   Assessment & Plan:   Principal Problem:   MSSA bacteremia Active Problems:   Morbid obesity ([place])   GERD without esophagitis   OSA (obstructive sleep apnea)   Chronic systolic CHF (congestive heart failure) ([name])   Paroxysmal atrial flutter ([name])   Hyponatremia   ESRD (end stage renal disease) on dialysis ([place])   Acute on chronic respiratory failure with hypoxia ([name])   Medical non-compliance   Hypotension   ESRD on dialysis [facility])   Palliative care by specialist   Full code status   Concern about end of life   1-MSSA Bacteremia; Arthralgia;  CT left Hip 9/25 destructive changes.  CT scan right knee no acute finding.  Blood culture 9/22. Positive for MSSA.  ECHO; no evidence of vegetation.  Perm cath removed ID consulted, recommend IV antibiotics for 6 weeks. Until Nov 6th.  On pain medication: dilaudid and Oxy.  Refused TEE initially, cardiology wants  more volume removed prior to TEE Repeated  Blood cultures negative Cardiology planning TEE on Monday.   Left SI septic arthritis, sacrum and iliac bone osteomyelitis elongated abscess involving the left iliacus muscle, small abscess left piriformis muscle.  -Ortho consulted. Patient is too high risk for surgery. Per IR abscess is small and even with CT scan, it is in a location that may not be amenable for drainage, for this reason and to avoid to give more contrast to patient to prevent worsening to any residual  renal function he has left wont proceed with CT scan or procedure. Per ortho there is not significant therapeutic improvement for mobility with drainage of abscess.  -plan to treat with IV antibiotics. He will need Imagine after completion of antibiotics.   Acute on chronic respiratory failure with hypoxemia and hypercapnia: Due to malfunctioning concentrator at home now with fluid overload Volume managed with hemodialysis. Nephrology following.   ESRD on hemodialysis: Had a new PermCath He has been declining dialysis on and off. Was evaluated by palliative care, patient agreed to have hemodialysis and further treatments.  Plan to monitor urine out put.   Biventricular heart failure, paroxysmal A. fib, hypotension: Action fraction less than 20%.  Followed by heart failure team.  Intolerance to advanced heart failure therapies.  On midodrine 20 3 times daily Volume managed with hemodialysis. On amiodarone for paroxysmal A. fib.  Morbid obesity: BMI more than 50. On Semaglutide.  Hyponatremia; volume manage with HD.   Constipation; had BM      Estimated body mass index is 67.64 kg/m as calculated from the following:   Height as of this  encounter: '5\' 11"'$  (1.803 m).   Weight as of this encounter: 220 kg.   DVT prophylaxis: Eliquis Code Status: Full Code Family Communication:  Disposition Plan:  Status is: Inpatient  Remains inpatient appropriate because:IV treatments appropriate due to intensity of illness or  inability to take PO  Dispo: The patient is from: Home              Anticipated d/c is to: SNF              Patient currently is not medically stable to d/c.   Difficult to place patient No        Consultants:  ID Nephrology   Procedures:  HD  Antimicrobials:  Cefazolin   Subjective: He had BM.  We discussed plan for treatment for pelvic infection.     Objective: Vitals:   05/01/21 2123 05/01/21 2331 05/02/21 0353 05/02/21 0754  BP:  108/65 108/68 115/69  Pulse: 87 85 85 85  Resp:  '16 18 20  '$ Temp:  97.9 F (36.6 C) 98.2 F (36.8 C) 98 F (36.7 C)  TempSrc:  Oral Oral Oral  SpO2: 95% 99% 94% 99%  Weight:      Height:        Intake/Output Summary (Last 24 hours) at 05/02/2021 1211 Last data filed at 05/02/2021 0900 Gross per 24 hour  Intake 1490 ml  Output 300 ml  Net 1190 ml    Filed Weights   04/25/21 1141 04/30/21 0759 04/30/21 1146  Weight: (!) 183.6 kg (!) 222.3 kg (!) 220 kg    Examination:  General exam: NAD Respiratory system: CTA Cardiovascular system: S 1, S 2 RRR Gastrointestinal system: BS present, soft, nt Central nervous system: alert Extremities: Trace edmea   Data Reviewed: I have personally reviewed following labs and imaging studies  CBC: Recent Labs  Lab 04/28/21 0631 04/30/21 0817  WBC 9.4 8.6  HGB 10.1* 9.6*  HCT 31.6* 30.4*  MCV 96.3 97.1  PLT 486* 482*    Basic Metabolic Panel: Recent Labs  Lab 04/28/21 0631 04/30/21 0817  NA 129* 131*  K 5.0 4.9  CL 93* 93*  CO2 27 27  GLUCOSE 88 117*  BUN 57* 53*  CREATININE 3.14* 3.03*  CALCIUM 9.8 9.9  PHOS 6.7* 5.0*    GFR: Estimated Creatinine Clearance: 63.5 mL/min (A) (by C-G formula based on SCr of 3.03 mg/dL (H)). Liver Function Tests: Recent Labs  Lab 04/28/21 0631 04/30/21 0817  ALBUMIN 2.7* 2.6*    No results for input(s): LIPASE, AMYLASE in the last 168 hours. No results for input(s): AMMONIA in the last 168 hours. Coagulation Profile: No  results for input(s): INR, PROTIME in the last 168 hours.  Cardiac Enzymes: No results for input(s): CKTOTAL, CKMB, CKMBINDEX, TROPONINI in the last 168 hours. BNP (last 3 results) No results for input(s): PROBNP in the last 8760 hours. HbA1C: No results for input(s): HGBA1C in the last 72 hours. CBG: No results for input(s): GLUCAP in the last 168 hours. Lipid Profile: No results for input(s): CHOL, HDL, LDLCALC, TRIG, CHOLHDL, LDLDIRECT in the last 72 hours. Thyroid Function Tests: No results for input(s): TSH, T4TOTAL, FREET4, T3FREE, THYROIDAB in the last 72 hours. Anemia Panel: No results for input(s): VITAMINB12, FOLATE, FERRITIN, TIBC, IRON, RETICCTPCT in the last 72 hours. Sepsis Labs: No results for input(s): PROCALCITON, LATICACIDVEN in the last 168 hours.  No results found for this or any previous visit (from the past 240 hour(s)).  Radiology Studies: No results found.      Scheduled Meds:  allopurinol  100 mg Oral Daily   amiodarone  200 mg Oral Daily   apixaban  5 mg Oral BID   bisacodyl  10 mg Rectal Once   Chlorhexidine Gluconate Cloth  6 each Topical Daily   darbepoetin (ARANESP) injection - DIALYSIS  150 mcg Intravenous Q Thu-HD   lanthanum  1,000 mg Oral TID WC   linaclotide  145 mcg Oral QAC breakfast   LORazepam  1 mg Intravenous Once   midodrine  20 mg Oral TID WC   polyethylene glycol  17 g Oral BID   sorbitol  30 mL Oral Once   sorbitol  30 mL Oral Once   Continuous Infusions:  sodium chloride Stopped (04/23/21 0830)   anticoagulant sodium citrate      ceFAZolin (ANCEF) IV 100 mL/hr at 05/02/21 0600     LOS: 16 days    Time spent: 35 minutes.     Elmarie Shiley, MD [facility]   If 7PM-7AM, please contact night-coverage [email or web address]  05/02/2021, 12:11 PM

## 2021-05-02 NOTE — Progress Notes (Signed)
  [name] Progress Note   Subjective:  Seen in room, no c/o today. Pt interested in finding out if his kidneys have gotten better. UOP 750 on 10/6, yest nothing recorded but not collecting all output.   Objective Vitals:   05/01/21 2123 05/01/21 2331 05/02/21 0353 05/02/21 0754  BP:  108/65 108/68 115/69  Pulse: 87 85 85 85  Resp:  '16 18 20  '$ Temp:  97.9 F (36.6 C) 98.2 F (36.8 C) 98 F (36.7 C)  TempSrc:  Oral Oral Oral  SpO2: 95% 99% 94% 99%  Weight:      Height:       Physical Exam General: Obese man, NAD. Nasal O2 in place. Heart: RRR; no murmur Lungs: CTA anteriorly Abdomen: soft, non-tender Extremities: No LE edema Dialysis Access:  TDC in R chest  Additional Objective Labs: Basic Metabolic Panel: Recent Labs  Lab 04/28/21 0631 04/30/21 0817  NA 129* 131*  K 5.0 4.9  CL 93* 93*  CO2 27 27  GLUCOSE 88 117*  BUN 57* 53*  CREATININE 3.14* 3.03*  CALCIUM 9.8 9.9  PHOS 6.7* 5.0*    Liver Function Tests: Recent Labs  Lab 04/28/21 0631 04/30/21 0817  ALBUMIN 2.7* 2.6*    CBC: Recent Labs  Lab 04/28/21 0631 04/30/21 0817  WBC 9.4 8.6  HGB 10.1* 9.6*  HCT 31.6* 30.4*  MCV 96.3 97.1  PLT 486* 482*    Studies/Results: No results found.  Medications:  sodium chloride Stopped (04/23/21 0830)   anticoagulant sodium citrate      ceFAZolin (ANCEF) IV 100 mL/hr at 05/02/21 0600    allopurinol  100 mg Oral Daily   amiodarone  200 mg Oral Daily   apixaban  5 mg Oral BID   bisacodyl  10 mg Rectal Once   Chlorhexidine Gluconate Cloth  6 each Topical Daily   darbepoetin (ARANESP) injection - DIALYSIS  150 mcg Intravenous Q Thu-HD   lanthanum  1,000 mg Oral TID WC   linaclotide  145 mcg Oral QAC breakfast   LORazepam  1 mg Intravenous Once   midodrine  20 mg Oral TID WC   polyethylene glycol  17 g Oral BID   sorbitol  30 mL Oral Once   sorbitol  30 mL Oral Once    Dialysis Orders: TTS/ AKI [facility] 4h  400/800     188.5kg   2/2 bath  TDC RIJ  Hep none  - no in center meds  Assessment/Plan: MSSA bacteremia: Blood Cx 9/22 + and catheter exit site purulence. S/p [facility] removal/line holiday 9/24 -> 9/28 new TDC placed. Has assoc L SI septic athritis w/ local osteo and 2 associated small abscesses. On IV Cefazolin x 6 week course (thru 11/5). Per pmd  Volume: improved, 4-5kg under dry wt. Daily wts are off, no edema by exam ESRD: on HD TTS. HD today. Will monitor labs post HD today to eval for renal recovery at pt request.  History of biventricular congestive heart failure: Not a candidate for LVAD or transplant, per notes. Hypotension: Stable on midodrine '20mg'$  TID. Atrial fibrillation: On amiodarone + Eliquis. CKD MBD: COrrCa high, Phos were high -> increase Fosrenol binders to '1000mg'$  TID. PTH was 42. Anemia of chronic kidney disease: Hgb 9- 11 - continue Aranesp 17mg weekly.   RKelly Splinter MD 05/02/2021, 10:40 AM

## 2021-05-03 DIAGNOSIS — R7881 Bacteremia: Secondary | ICD-10-CM | POA: Diagnosis not present

## 2021-05-03 DIAGNOSIS — B9561 Methicillin susceptible Staphylococcus aureus infection as the cause of diseases classified elsewhere: Secondary | ICD-10-CM | POA: Diagnosis not present

## 2021-05-03 LAB — BASIC METABOLIC PANEL
Anion gap: 10 (ref 5–15)
BUN: 22 mg/dL — ABNORMAL HIGH (ref 6–20)
CO2: 25 mmol/L (ref 22–32)
Calcium: 9.7 mg/dL (ref 8.9–10.3)
Chloride: 94 mmol/L — ABNORMAL LOW (ref 98–111)
Creatinine, Ser: 3.65 mg/dL — ABNORMAL HIGH (ref 0.61–1.24)
GFR, Estimated: 21 mL/min — ABNORMAL LOW (ref >60)
Glucose, Bld: 123 mg/dL — ABNORMAL HIGH (ref 70–99)
Potassium: 4 mmol/L (ref 3.5–5.1)
Sodium: 129 mmol/L — ABNORMAL LOW (ref 135–145)

## 2021-05-03 NOTE — Progress Notes (Signed)
[facility] Progress Note   Subjective:  Seen in room. Pt interested in finding out if his kidneys have gotten better. 600 cc UOP yest. Creat post HD last night was 3.1 and this am 3.6. UF w/ HD yest 1.5 L only.   Objective Vitals:   05/02/21 1911 05/02/21 2246 05/03/21 0447 05/03/21 0756  BP: 102/66 (!) 110/52 111/73 109/75  Pulse: 83 84 90 84  Resp: (!) 24 (!) 23 (!) 24   Temp: 98.3 F (36.8 C) 97.8 F (36.6 C) 98 F (36.7 C) 98 F (36.7 C)  TempSrc: Oral Oral Oral Oral  SpO2: 97% 97% 93% 100%  Weight:      Height:       Physical Exam General: Obese man, NAD. Nasal O2 in place. Heart: RRR; no murmur Lungs: CTA anteriorly Abdomen: soft, non-tender Extremities: No LE edema Dialysis Access:  TDC in R chest  Additional Objective Labs: Basic Metabolic Panel: Recent Labs  Lab 04/28/21 0631 04/30/21 0817 05/02/21 1251 05/02/21 1944 05/03/21 0041  NA 129* 131* 127* 130* 129*  K 5.0 4.9 4.3 4.2 4.0  CL 93* 93* 91* 93* 94*  CO2 '27 27 27 25 25  '$ GLUCOSE 88 117* 91 109* 123*  BUN 57* 53* 38* 21* 22*  CREATININE 3.14* 3.03* 4.23* 3.16* 3.65*  CALCIUM 9.8 9.9 9.8 9.5 9.7  PHOS 6.7* 5.0* 5.3*  --   --     Liver Function Tests: Recent Labs  Lab 04/28/21 0631 04/30/21 0817 05/02/21 1251  ALBUMIN 2.7* 2.6* 2.7*    CBC: Recent Labs  Lab 04/28/21 0631 04/30/21 0817 05/02/21 1251  WBC 9.4 8.6 7.4  HGB 10.1* 9.6* 10.0*  HCT 31.6* 30.4* 32.6*  MCV 96.3 97.1 97.0  PLT 486* 482* 461*    Studies/Results: No results found.  Medications:  sodium chloride Stopped (04/23/21 0830)   anticoagulant sodium citrate     anticoagulant sodium citrate      ceFAZolin (ANCEF) IV 1 g (05/02/21 1650)    allopurinol  100 mg Oral Daily   amiodarone  200 mg Oral Daily   apixaban  5 mg Oral BID   bisacodyl  10 mg Rectal Once   Chlorhexidine Gluconate Cloth  6 each Topical Daily   darbepoetin (ARANESP) injection - DIALYSIS  150 mcg Intravenous Q Thu-HD    lanthanum  1,000 mg Oral TID WC   linaclotide  145 mcg Oral QAC breakfast   midodrine  20 mg Oral TID WC   polyethylene glycol  17 g Oral BID    Dialysis Orders: TTS/ AKI [facility] 4h  400/800    188.5kg   2/2 bath  TDC RIJ  Hep none  - no in center meds  Assessment/Plan: MSSA bacteremia: Blood Cx 9/22 + and catheter exit site purulence. S/p [facility] removal/line holiday 9/24 -> 9/28 new TDC placed. Has assoc left-sided SI joint septic athritis w/ local osteo and 2 associated small abscesses. Per ortho too high risk for surgical Rx. Per IR abscesses are small, would need IV contrast CT to attempt drain and even with that they may not be amenable to drainage; so drainage is not being pursued. On IV Cefazolin x 6 week course (thru 11/5). Per pmd  Volume: improved, 4-5kg under dry wt. No edema by exam ESRD: on HD TTS. HD Sat, next HD 10/11. Get daily labs x 2 to eval for renal recovery at pt request.  History of biventricular congestive heart failure: Not a candidate for  LVAD or transplant, per notes. Hypotension: Stable on midodrine '20mg'$  TID. Atrial fibrillation: On amiodarone + Eliquis. CKD MBD: COrrCa high, Phos were high -> increased Fosrenol binders to '1000mg'$  TID. PTH was 42. Anemia of chronic kidney disease: Hgb 9- 11 - continue Aranesp 162mg weekly.   RKelly Splinter MD 05/03/2021, 11:22 AM

## 2021-05-03 NOTE — Progress Notes (Signed)
Patient refused BiPAP for the night.  

## 2021-05-03 NOTE — Progress Notes (Signed)
PROGRESS NOTE    Phillip Young  [id number] DOB: 1985/11/10 DOA: 04/12/2021 PCP: [name], MD   Brief Narrative: 35 year old with past medical history significant for severe biventricular systolic heart failure with known ejection fraction less than 20%, diabetes type 2, paroxysmal a flutter on Eliquis, recently started on hemodialysis with recent extensive hospitalization with cardiogenic shock and cardiorenal syndrome progressed to ESRD presented back to the emergency room with shortness of breath, left wrist and right knee pain, unable to ambulate.  Home oxygen concentrator not working.  Chest x-ray showed pulmonary edema, COVID-19 negative.  Patient was found to have MSSA bacteremia due to infected dialysis catheter.  ID and nephrology consulted.  After catheter holiday he received new permanent cath on 9/28 and restarted on hemodialysis. Patient has been declining treatments for the last week on and off.  He now has agreed to dialysis and MRI to evaluate hip pain.   Assessment & Plan:   Principal Problem:   MSSA bacteremia Active Problems:   Morbid obesity ([place])   GERD without esophagitis   OSA (obstructive sleep apnea)   Chronic systolic CHF (congestive heart failure) ([name])   Paroxysmal atrial flutter ([name])   Hyponatremia   ESRD (end stage renal disease) on dialysis ([place])   Acute on chronic respiratory failure with hypoxia ([name])   Medical non-compliance   Hypotension   ESRD on dialysis [facility])   Palliative care by specialist   Full code status   Concern about end of life   1-MSSA Bacteremia; Arthralgia;  CT left Hip 9/25 destructive changes.  CT scan right knee no acute finding.  Blood culture 9/22. Positive for MSSA.  ECHO; no evidence of vegetation.  Perm cath removed ID consulted, recommend IV antibiotics for 6 weeks. Until Nov 6th.  On pain medication: dilaudid and Oxy.  Refused TEE initially, cardiology wants  more volume removed prior to TEE Repeated  Blood cultures negative Cardiology planning TEE on Monday.   Left SI septic arthritis, sacrum and iliac bone osteomyelitis elongated abscess involving the left iliacus muscle, small abscess left piriformis muscle.  -Ortho consulted. Patient is too high risk for surgery. Per IR abscess is small and even with CT scan, it is in a location that may not be amenable for drainage, for this reason and to avoid to give more contrast to patient to prevent worsening to any residual  renal function he has left wont proceed with CT scan or procedure. Per ortho there is not significant therapeutic improvement for mobility with drainage of abscess.  -plan to treat with IV antibiotics. He will need Imagine after completion of antibiotics.  -pain better.   Acute on chronic respiratory failure with hypoxemia and hypercapnia: Due to malfunctioning concentrator at home now with fluid overload Volume managed with hemodialysis. Nephrology following.   ESRD on hemodialysis: Had a new PermCath He has been declining dialysis on and off. Was evaluated by palliative care, patient agreed to have hemodialysis and further treatments.  Plan to monitor urine out put.   Biventricular heart failure, paroxysmal A. fib, hypotension: Action fraction less than 20%.  Followed by heart failure team.  Intolerance to advanced heart failure therapies.  On midodrine 20 3 times daily Volume managed with hemodialysis. On amiodarone for paroxysmal A. fib.  Morbid obesity: BMI more than 50. On Semaglutide.  Hyponatremia; volume manage with HD.   Constipation; had BM      Estimated body mass index is 67.64 kg/m as calculated from the following:   Height  as of this encounter: '5\' 11"'$  (1.803 m).   Weight as of this encounter: 220 kg.   DVT prophylaxis: Eliquis Code Status: Full Code Family Communication:  Disposition Plan:  Status is: Inpatient  Remains inpatient appropriate because:IV treatments appropriate due to  intensity of illness or inability to take PO  Dispo: The patient is from: Home              Anticipated d/c is to: SNF              Patient currently is not medically stable to d/c.   Difficult to place patient No        Consultants:  ID Nephrology   Procedures:  HD  Antimicrobials:  Cefazolin   Subjective: He report improvement of hip pain and leg pain . He hasn't required significant amount of pain med.   Objective: Vitals:   05/02/21 2246 05/03/21 0447 05/03/21 0756 05/03/21 1300  BP: (!) 110/52 111/73 109/75 112/72  Pulse: 84 90 84 80  Resp: (!) 23 (!) 24  19  Temp: 97.8 F (36.6 C) 98 F (36.7 C) 98 F (36.7 C) 98 F (36.7 C)  TempSrc: Oral Oral Oral Oral  SpO2: 97% 93% 100% 100%  Weight:      Height:        Intake/Output Summary (Last 24 hours) at 05/03/2021 1340 Last data filed at 05/03/2021 0857 Gross per 24 hour  Intake 540 ml  Output 2500 ml  Net -1960 ml    Filed Weights   04/30/21 0759 04/30/21 1146  Weight: (!) 222.3 kg (!) 220 kg    Examination:  General exam: NAD Respiratory system: CTA Cardiovascular system: S 1, S 2 RRR Gastrointestinal system: BS present, soft, nt Central nervous system: Alert, conversant.  Extremities: Trace edema   Data Reviewed: I have personally reviewed following labs and imaging studies  CBC: Recent Labs  Lab 04/28/21 0631 04/30/21 0817 05/02/21 1251  WBC 9.4 8.6 7.4  HGB 10.1* 9.6* 10.0*  HCT 31.6* 30.4* 32.6*  MCV 96.3 97.1 97.0  PLT 486* 482* 461*    Basic Metabolic Panel: Recent Labs  Lab 04/28/21 0631 04/30/21 0817 05/02/21 1251 05/02/21 1944 05/03/21 0041  NA 129* 131* 127* 130* 129*  K 5.0 4.9 4.3 4.2 4.0  CL 93* 93* 91* 93* 94*  CO2 '27 27 27 25 25  '$ GLUCOSE 88 117* 91 109* 123*  BUN 57* 53* 38* 21* 22*  CREATININE 3.14* 3.03* 4.23* 3.16* 3.65*  CALCIUM 9.8 9.9 9.8 9.5 9.7  PHOS 6.7* 5.0* 5.3*  --   --     GFR: Estimated Creatinine Clearance: 52.7 mL/min (A) (by C-G  formula based on SCr of 3.65 mg/dL (H)). Liver Function Tests: Recent Labs  Lab 04/28/21 0631 04/30/21 0817 05/02/21 1251  ALBUMIN 2.7* 2.6* 2.7*    No results for input(s): LIPASE, AMYLASE in the last 168 hours. No results for input(s): AMMONIA in the last 168 hours. Coagulation Profile: No results for input(s): INR, PROTIME in the last 168 hours.  Cardiac Enzymes: No results for input(s): CKTOTAL, CKMB, CKMBINDEX, TROPONINI in the last 168 hours. BNP (last 3 results) No results for input(s): PROBNP in the last 8760 hours. HbA1C: No results for input(s): HGBA1C in the last 72 hours. CBG: No results for input(s): GLUCAP in the last 168 hours. Lipid Profile: No results for input(s): CHOL, HDL, LDLCALC, TRIG, CHOLHDL, LDLDIRECT in the last 72 hours. Thyroid Function Tests: No results for input(s): TSH, T4TOTAL,  FREET4, T3FREE, THYROIDAB in the last 72 hours. Anemia Panel: No results for input(s): VITAMINB12, FOLATE, FERRITIN, TIBC, IRON, RETICCTPCT in the last 72 hours. Sepsis Labs: No results for input(s): PROCALCITON, LATICACIDVEN in the last 168 hours.  No results found for this or any previous visit (from the past 240 hour(s)).       Radiology Studies: No results found.      Scheduled Meds:  allopurinol  100 mg Oral Daily   amiodarone  200 mg Oral Daily   apixaban  5 mg Oral BID   bisacodyl  10 mg Rectal Once   Chlorhexidine Gluconate Cloth  6 each Topical Daily   darbepoetin (ARANESP) injection - DIALYSIS  150 mcg Intravenous Q Thu-HD   lanthanum  1,000 mg Oral TID WC   linaclotide  145 mcg Oral QAC breakfast   midodrine  20 mg Oral TID WC   polyethylene glycol  17 g Oral BID   Continuous Infusions:  sodium chloride Stopped (04/23/21 0830)   anticoagulant sodium citrate     anticoagulant sodium citrate      ceFAZolin (ANCEF) IV 1 g (05/02/21 1650)     LOS: 17 days    Time spent: 35 minutes.     Elmarie Shiley, MD [facility]   If  7PM-7AM, please contact night-coverage [email or web address]  05/03/2021, 1:40 PM

## 2021-05-03 NOTE — Plan of Care (Signed)
  Problem: Education: Goal: Knowledge of General Education information will improve Description: Including pain rating scale, medication(s)/side effects and non-pharmacologic comfort measures Outcome: Progressing   Problem: Health Behavior/Discharge Planning: Goal: Ability to manage health-related needs will improve Outcome: Progressing   Problem: Clinical Measurements: Goal: Ability to maintain clinical measurements within normal limits will improve Outcome: Progressing Goal: Will remain free from infection Outcome: Progressing Goal: Diagnostic test results will improve Outcome: Progressing Goal: Respiratory complications will improve Outcome: Progressing Goal: Cardiovascular complication will be avoided Outcome: Progressing   Problem: Activity: Goal: Risk for activity intolerance will decrease Outcome: Progressing   Problem: Nutrition: Goal: Adequate nutrition will be maintained Outcome: Progressing   Problem: Coping: Goal: Level of anxiety will decrease Outcome: Progressing   Problem: Elimination: Goal: Will not experience complications related to bowel motility Outcome: Progressing Goal: Will not experience complications related to urinary retention Outcome: Progressing   Problem: Pain Managment: Goal: General experience of comfort will improve Outcome: Progressing   Problem: Safety: Goal: Ability to remain free from injury will improve Outcome: Progressing   Problem: Skin Integrity: Goal: Risk for impaired skin integrity will decrease Outcome: Progressing   Problem: Education: Goal: Ability to demonstrate management of disease process will improve Outcome: Progressing Goal: Ability to verbalize understanding of medication therapies will improve Outcome: Progressing Goal: Individualized Educational Video(s) Outcome: Progressing   Problem: Activity: Goal: Capacity to carry out activities will improve Outcome: Progressing   Problem: Cardiac: Goal:  Ability to achieve and maintain adequate cardiopulmonary perfusion will improve Outcome: Progressing   Problem: Education: Goal: Knowledge of disease and its progression will improve Outcome: Progressing   Problem: Health Behavior/Discharge Planning: Goal: Ability to manage health-related needs will improve Outcome: Progressing   Problem: Clinical Measurements: Goal: Complications related to the disease process or treatment will be avoided or minimized Outcome: Progressing Goal: Dialysis access will remain free of complications Outcome: Progressing   Problem: Activity: Goal: Activity intolerance will improve Outcome: Progressing   Problem: Fluid Volume: Goal: Fluid volume balance will be maintained or improved Outcome: Progressing   Problem: Nutritional: Goal: Ability to make appropriate dietary choices will improve Outcome: Progressing   Problem: Respiratory: Goal: Respiratory symptoms related to disease process will be avoided Outcome: Progressing   Problem: Self-Concept: Goal: Body image disturbance will be avoided or minimized Outcome: Progressing   Problem: Urinary Elimination: Goal: Progression of disease will be identified and treated Outcome: Progressing

## 2021-05-04 ENCOUNTER — Encounter (HOSPITAL_COMMUNITY): Payer: Self-pay | Admitting: Internal Medicine

## 2021-05-04 DIAGNOSIS — B9561 Methicillin susceptible Staphylococcus aureus infection as the cause of diseases classified elsewhere: Secondary | ICD-10-CM | POA: Diagnosis not present

## 2021-05-04 DIAGNOSIS — R7881 Bacteremia: Secondary | ICD-10-CM | POA: Diagnosis not present

## 2021-05-04 LAB — CBC
HCT: 33.5 % — ABNORMAL LOW (ref 39.0–52.0)
Hemoglobin: 10.6 g/dL — ABNORMAL LOW (ref 13.0–17.0)
MCH: 30.5 pg (ref 26.0–34.0)
MCHC: 31.6 g/dL (ref 30.0–36.0)
MCV: 96.5 fL (ref 80.0–100.0)
Platelets: 406 10*3/uL — ABNORMAL HIGH (ref 150–400)
RBC: 3.47 MIL/uL — ABNORMAL LOW (ref 4.22–5.81)
RDW: 17.4 % — ABNORMAL HIGH (ref 11.5–15.5)
WBC: 8.5 10*3/uL (ref 4.0–10.5)
nRBC: 0 % (ref 0.0–0.2)

## 2021-05-04 LAB — BASIC METABOLIC PANEL
Anion gap: 9 (ref 5–15)
BUN: 31 mg/dL — ABNORMAL HIGH (ref 6–20)
CO2: 26 mmol/L (ref 22–32)
Calcium: 9.8 mg/dL (ref 8.9–10.3)
Chloride: 95 mmol/L — ABNORMAL LOW (ref 98–111)
Creatinine, Ser: 4.35 mg/dL — ABNORMAL HIGH (ref 0.61–1.24)
GFR, Estimated: 17 mL/min — ABNORMAL LOW (ref >60)
Glucose, Bld: 105 mg/dL — ABNORMAL HIGH (ref 70–99)
Potassium: 4.2 mmol/L (ref 3.5–5.1)
Sodium: 130 mmol/L — ABNORMAL LOW (ref 135–145)

## 2021-05-04 MED ORDER — SODIUM CHLORIDE 0.9 % IV SOLN: INTRAVENOUS | Status: DC

## 2021-05-04 NOTE — TOC CM/SW Note (Signed)
TOC CM received call from [employer] rep, Daymon Larsen # [phone] stating she is processing pt's long-term disability form. Pt will need to sign a HIPAA release form to gain medical records to process form. TOC CM spoke to pt and gave permission to speak to [employer] rep about his disability. She will email form for patient to sign. [place], Heart Failure TOC CM [phone]

## 2021-05-04 NOTE — Progress Notes (Signed)
[facility] Progress Note   Subjective:  Seen in room. No acute events. Uop ~400cc charted. Inquiring about recovery, cr 3.7 -> 4.4 today. No complaints  Objective Vitals:   05/03/21 1800 05/03/21 2047 05/04/21 0128 05/04/21 0520  BP:  120/78 119/78 115/75  Pulse: 80 71 78 77  Resp:  (!) '23 20 15  '$ Temp:  98.4 F (36.9 C) 97.6 F (36.4 C) 97.8 F (36.6 C)  TempSrc:  Oral Oral Oral  SpO2: 97% 100% 100% 100%  Weight:      Height:       Physical Exam General: Obese man, NAD. Nasal O2 in place. Heart: RRR; no murmur Lungs: CTA anteriorly Abdomen: soft, non-tender Extremities: No LE edema Dialysis Access:  TDC in R chest  Additional Objective Labs: Basic Metabolic Panel: Recent Labs  Lab 04/28/21 0631 04/30/21 0817 05/02/21 1251 05/02/21 1944 05/03/21 0041 05/04/21 0011  NA 129* 131* 127* 130* 129* 130*  K 5.0 4.9 4.3 4.2 4.0 4.2  CL 93* 93* 91* 93* 94* 95*  CO2 '27 27 27 25 25 26  '$ GLUCOSE 88 117* 91 109* 123* 105*  BUN 57* 53* 38* 21* 22* 31*  CREATININE 3.14* 3.03* 4.23* 3.16* 3.65* 4.35*  CALCIUM 9.8 9.9 9.8 9.5 9.7 9.8  PHOS 6.7* 5.0* 5.3*  --   --   --    Liver Function Tests: Recent Labs  Lab 04/28/21 0631 04/30/21 0817 05/02/21 1251  ALBUMIN 2.7* 2.6* 2.7*   CBC: Recent Labs  Lab 04/28/21 0631 04/30/21 0817 05/02/21 1251 05/04/21 0011  WBC 9.4 8.6 7.4 8.5  HGB 10.1* 9.6* 10.0* 10.6*  HCT 31.6* 30.4* 32.6* 33.5*  MCV 96.3 97.1 97.0 96.5  PLT 486* 482* 461* 406*   Studies/Results: No results found.  Medications:  sodium chloride Stopped (04/23/21 0830)   anticoagulant sodium citrate     anticoagulant sodium citrate      ceFAZolin (ANCEF) IV Stopped (05/03/21 1619)    allopurinol  100 mg Oral Daily   amiodarone  200 mg Oral Daily   apixaban  5 mg Oral BID   bisacodyl  10 mg Rectal Once   Chlorhexidine Gluconate Cloth  6 each Topical Daily   darbepoetin (ARANESP) injection - DIALYSIS  150 mcg Intravenous Q Thu-HD    lanthanum  1,000 mg Oral TID WC   linaclotide  145 mcg Oral QAC breakfast   midodrine  20 mg Oral TID WC   polyethylene glycol  17 g Oral BID    Dialysis Orders: TTS/ AKI [facility] 4h  400/800    188.5kg   2/2 bath  TDC RIJ  Hep none  - no in center meds  Assessment/Plan: MSSA bacteremia: Blood Cx 9/22 + and catheter exit site purulence. S/p [facility] removal/line holiday 9/24 -> 9/28 new TDC placed. Has assoc left-sided SI joint septic athritis w/ local osteo and 2 associated small abscesses. Per ortho too high risk for surgical Rx. Per IR abscesses are small, would need IV contrast CT to attempt drain and even with that they may not be amenable to drainage; so drainage is not being pursued. On IV Cefazolin x 6 week course (thru 11/5). Per pmd  Volume: improved, 4-5kg under dry wt. No edema by exam ESRD: on HD TTS. Next HD 10/11. No signs of robust recovery at this moment, will see labs tomorrow AM. Discussed w/ patient and he agrees with continuing HD History of biventricular congestive heart failure: Not a candidate for LVAD or  transplant, per notes. Hypotension: Stable on midodrine '20mg'$  TID. Atrial fibrillation: On amiodarone + Eliquis. CKD MBD: COrrCa high, Phos were high -> increased Fosrenol binders to '1000mg'$  TID. PTH was 42. Anemia of chronic kidney disease: Hgb 9- 11 - continue Aranesp 130mg weekly.   VGean Quint MD [facility] 05/04/2021, 8:01 AM

## 2021-05-04 NOTE — Progress Notes (Signed)
Patient refused BiPAP for the night. States it gives him a headache. Informed patient if he changed his mind to have RN to call.

## 2021-05-04 NOTE — H&P (View-Only) (Signed)
PROGRESS NOTE    Phillip Young  [id number] DOB: 03/04/1986 DOA: 04/12/2021 PCP: [name], MD   Brief Narrative: 35 year old with past medical history significant for severe biventricular systolic heart failure with known ejection fraction less than 20%, diabetes type 2, paroxysmal a flutter on Eliquis, recently started on hemodialysis with recent extensive hospitalization with cardiogenic shock and cardiorenal syndrome progressed to ESRD presented back to the emergency room with shortness of breath, left wrist and right knee pain, unable to ambulate.  Home oxygen concentrator not working.  Chest x-ray showed pulmonary edema, COVID-19 negative.  Patient was found to have MSSA bacteremia due to infected dialysis catheter.  ID and nephrology consulted.  After catheter holiday he received new permanent cath on 9/28 and restarted on hemodialysis. Patient has been declining treatments for the last week on and off.  He now has agreed to dialysis and MRI to evaluate hip pain.   Assessment & Plan:   Principal Problem:   MSSA bacteremia Active Problems:   Morbid obesity ([place])   GERD without esophagitis   OSA (obstructive sleep apnea)   Chronic systolic CHF (congestive heart failure) ([name])   Paroxysmal atrial flutter ([name])   Hyponatremia   ESRD (end stage renal disease) on dialysis ([place])   Acute on chronic respiratory failure with hypoxia ([name])   Medical non-compliance   Hypotension   ESRD on dialysis [facility])   Palliative care by specialist   Full code status   Concern about end of life   1-MSSA Bacteremia; Arthralgia;  CT left Hip 9/25 destructive changes.  CT scan right knee no acute finding.  Blood culture 9/22. Positive for MSSA.  ECHO; no evidence of vegetation.  Perm cath removed ID consulted, recommend IV antibiotics for 6 weeks. Until Nov 6th.  On pain medication: dilaudid and Oxy.  Refused TEE initially, cardiology wants  more volume removed prior to TEE Repeated  Blood cultures negative Stable on IV antibiotics.   Left SI septic arthritis, sacrum and iliac bone osteomyelitis elongated abscess involving the left iliacus muscle, small abscess left piriformis muscle.  -Ortho consulted. Patient is too high risk for surgery. Per IR abscess is small and even with CT scan, it is in a location that may not be amenable for drainage, for this reason and to avoid to give more contrast to patient to prevent worsening to any residual  renal function he has left wont proceed with CT scan or procedure. Per ortho there is not significant therapeutic improvement for mobility with drainage of abscess.  -plan to treat with IV antibiotics. He will need Imagine after completion of antibiotics.  -pain improving.   Acute on chronic respiratory failure with hypoxemia and hypercapnia: Due to malfunctioning concentrator at home now with fluid overload Volume managed with hemodialysis. Nephrology following.   ESRD on hemodialysis: Had a new PermCath He has been declining dialysis on and off. He is now more complaint with HD>  Was evaluated by palliative care, patient agreed to have hemodialysis and further treatments.  Plan to monitor urine out put.   Biventricular heart failure, paroxysmal A. fib, hypotension: Action fraction less than 20%.  Followed by heart failure team.  Intolerance to advanced heart failure therapies.  On midodrine 20 3 times daily Volume managed with hemodialysis. On amiodarone for paroxysmal A. fib.  Morbid obesity: BMI more than 50. On Semaglutide.  Hyponatremia; volume manage with HD.   Constipation; had BM      Estimated body mass index is 67.64 kg/m as  calculated from the following:   Height as of this encounter: '5\' 11"'$  (1.803 m).   Weight as of this encounter: 220 kg.   DVT prophylaxis: Eliquis Code Status: Full Code Family Communication:  Disposition Plan:  Status is: Inpatient  Remains inpatient appropriate because:IV  treatments appropriate due to intensity of illness or inability to take PO  Dispo: The patient is from: Home              Anticipated d/c is to: SNF              Patient currently is  medically stable to d/c.    Difficult to place patient No        Consultants:  ID Nephrology   Procedures:  HD  Antimicrobials:  Cefazolin   Subjective: He report improvement of hip and knee pain. Denies dyspnea.  He has been stable on 6 L oxygen.    Objective: Vitals:   05/04/21 0520 05/04/21 0811 05/04/21 1142 05/04/21 1556  BP: 115/75 103/71 103/62 115/67  Pulse: 77 77 80 80  Resp: '15 13 19 '$ (!) 27  Temp: 97.8 F (36.6 C) 97.9 F (36.6 C) 97.6 F (36.4 C) 98.1 F (36.7 C)  TempSrc: Oral Oral Oral Oral  SpO2: 100% 99% 100% 100%  Weight:      Height:        Intake/Output Summary (Last 24 hours) at 05/04/2021 1734 Last data filed at 05/04/2021 0215 Gross per 24 hour  Intake 600 ml  Output 450 ml  Net 150 ml    Filed Weights   04/30/21 0759 04/30/21 1146  Weight: (!) 222.3 kg (!) 220 kg    Examination:  General exam: NAD Respiratory system: BL air Movement, distant heart sounds.  Cardiovascular system: S 1, S 2 RRR Gastrointestinal system: BS present, soft, nt Central nervous system: alert, conversant.  Extremities: edema   Data Reviewed: I have personally reviewed following labs and imaging studies  CBC: Recent Labs  Lab 04/28/21 0631 04/30/21 0817 05/02/21 1251 05/04/21 0011  WBC 9.4 8.6 7.4 8.5  HGB 10.1* 9.6* 10.0* 10.6*  HCT 31.6* 30.4* 32.6* 33.5*  MCV 96.3 97.1 97.0 96.5  PLT 486* 482* 461* 406*    Basic Metabolic Panel: Recent Labs  Lab 04/28/21 0631 04/30/21 0817 05/02/21 1251 05/02/21 1944 05/03/21 0041 05/04/21 0011  NA 129* 131* 127* 130* 129* 130*  K 5.0 4.9 4.3 4.2 4.0 4.2  CL 93* 93* 91* 93* 94* 95*  CO2 '27 27 27 25 25 26  '$ GLUCOSE 88 117* 91 109* 123* 105*  BUN 57* 53* 38* 21* 22* 31*  CREATININE 3.14* 3.03* 4.23* 3.16* 3.65*  4.35*  CALCIUM 9.8 9.9 9.8 9.5 9.7 9.8  PHOS 6.7* 5.0* 5.3*  --   --   --     GFR: Estimated Creatinine Clearance: 44.2 mL/min (A) (by C-G formula based on SCr of 4.35 mg/dL (H)). Liver Function Tests: Recent Labs  Lab 04/28/21 0631 04/30/21 0817 05/02/21 1251  ALBUMIN 2.7* 2.6* 2.7*    No results for input(s): LIPASE, AMYLASE in the last 168 hours. No results for input(s): AMMONIA in the last 168 hours. Coagulation Profile: No results for input(s): INR, PROTIME in the last 168 hours.  Cardiac Enzymes: No results for input(s): CKTOTAL, CKMB, CKMBINDEX, TROPONINI in the last 168 hours. BNP (last 3 results) No results for input(s): PROBNP in the last 8760 hours. HbA1C: No results for input(s): HGBA1C in the last 72 hours. CBG: No results for  input(s): GLUCAP in the last 168 hours. Lipid Profile: No results for input(s): CHOL, HDL, LDLCALC, TRIG, CHOLHDL, LDLDIRECT in the last 72 hours. Thyroid Function Tests: No results for input(s): TSH, T4TOTAL, FREET4, T3FREE, THYROIDAB in the last 72 hours. Anemia Panel: No results for input(s): VITAMINB12, FOLATE, FERRITIN, TIBC, IRON, RETICCTPCT in the last 72 hours. Sepsis Labs: No results for input(s): PROCALCITON, LATICACIDVEN in the last 168 hours.  No results found for this or any previous visit (from the past 240 hour(s)).       Radiology Studies: No results found.      Scheduled Meds:  allopurinol  100 mg Oral Daily   amiodarone  200 mg Oral Daily   apixaban  5 mg Oral BID   bisacodyl  10 mg Rectal Once   Chlorhexidine Gluconate Cloth  6 each Topical Daily   darbepoetin (ARANESP) injection - DIALYSIS  150 mcg Intravenous Q Thu-HD   lanthanum  1,000 mg Oral TID WC   linaclotide  145 mcg Oral QAC breakfast   midodrine  20 mg Oral TID WC   polyethylene glycol  17 g Oral BID   Continuous Infusions:  sodium chloride Stopped (04/23/21 0830)   anticoagulant sodium citrate     anticoagulant sodium citrate       ceFAZolin (ANCEF) IV 1 g (05/04/21 1612)     LOS: 18 days    Time spent: 35 minutes.     Elmarie Shiley, MD [facility]   If 7PM-7AM, please contact night-coverage [email or web address]  05/04/2021, 5:34 PM

## 2021-05-04 NOTE — Progress Notes (Signed)
PROGRESS NOTE    Phillip Young  [id number] DOB: 02-08-1986 DOA: 04/12/2021 PCP: [name], MD   Brief Narrative: 35 year old with past medical history significant for severe biventricular systolic heart failure with known ejection fraction less than 20%, diabetes type 2, paroxysmal a flutter on Eliquis, recently started on hemodialysis with recent extensive hospitalization with cardiogenic shock and cardiorenal syndrome progressed to ESRD presented back to the emergency room with shortness of breath, left wrist and right knee pain, unable to ambulate.  Home oxygen concentrator not working.  Chest x-ray showed pulmonary edema, COVID-19 negative.  Patient was found to have MSSA bacteremia due to infected dialysis catheter.  ID and nephrology consulted.  After catheter holiday he received new permanent cath on 9/28 and restarted on hemodialysis. Patient has been declining treatments for the last week on and off.  He now has agreed to dialysis and MRI to evaluate hip pain.   Assessment & Plan:   Principal Problem:   MSSA bacteremia Active Problems:   Morbid obesity ([place])   GERD without esophagitis   OSA (obstructive sleep apnea)   Chronic systolic CHF (congestive heart failure) ([name])   Paroxysmal atrial flutter ([name])   Hyponatremia   ESRD (end stage renal disease) on dialysis ([place])   Acute on chronic respiratory failure with hypoxia ([name])   Medical non-compliance   Hypotension   ESRD on dialysis [facility])   Palliative care by specialist   Full code status   Concern about end of life   1-MSSA Bacteremia; Arthralgia;  CT left Hip 9/25 destructive changes.  CT scan right knee no acute finding.  Blood culture 9/22. Positive for MSSA.  ECHO; no evidence of vegetation.  Perm cath removed ID consulted, recommend IV antibiotics for 6 weeks. Until Nov 6th.  On pain medication: dilaudid and Oxy.  Refused TEE initially, cardiology wants  more volume removed prior to TEE Repeated  Blood cultures negative Stable on IV antibiotics.   Left SI septic arthritis, sacrum and iliac bone osteomyelitis elongated abscess involving the left iliacus muscle, small abscess left piriformis muscle.  -Ortho consulted. Patient is too high risk for surgery. Per IR abscess is small and even with CT scan, it is in a location that may not be amenable for drainage, for this reason and to avoid to give more contrast to patient to prevent worsening to any residual  renal function he has left wont proceed with CT scan or procedure. Per ortho there is not significant therapeutic improvement for mobility with drainage of abscess.  -plan to treat with IV antibiotics. He will need Imagine after completion of antibiotics.  -pain improving.   Acute on chronic respiratory failure with hypoxemia and hypercapnia: Due to malfunctioning concentrator at home now with fluid overload Volume managed with hemodialysis. Nephrology following.   ESRD on hemodialysis: Had a new PermCath He has been declining dialysis on and off. He is now more complaint with HD>  Was evaluated by palliative care, patient agreed to have hemodialysis and further treatments.  Plan to monitor urine out put.   Biventricular heart failure, paroxysmal A. fib, hypotension: Action fraction less than 20%.  Followed by heart failure team.  Intolerance to advanced heart failure therapies.  On midodrine 20 3 times daily Volume managed with hemodialysis. On amiodarone for paroxysmal A. fib.  Morbid obesity: BMI more than 50. On Semaglutide.  Hyponatremia; volume manage with HD.   Constipation; had BM      Estimated body mass index is 67.64 kg/m as  calculated from the following:   Height as of this encounter: '5\' 11"'$  (1.803 m).   Weight as of this encounter: 220 kg.   DVT prophylaxis: Eliquis Code Status: Full Code Family Communication:  Disposition Plan:  Status is: Inpatient  Remains inpatient appropriate because:IV  treatments appropriate due to intensity of illness or inability to take PO  Dispo: The patient is from: Home              Anticipated d/c is to: SNF              Patient currently is  medically stable to d/c.    Difficult to place patient No        Consultants:  ID Nephrology   Procedures:  HD  Antimicrobials:  Cefazolin   Subjective: He report improvement of hip and knee pain. Denies dyspnea.  He has been stable on 6 L oxygen.    Objective: Vitals:   05/04/21 0520 05/04/21 0811 05/04/21 1142 05/04/21 1556  BP: 115/75 103/71 103/62 115/67  Pulse: 77 77 80 80  Resp: '15 13 19 '$ (!) 27  Temp: 97.8 F (36.6 C) 97.9 F (36.6 C) 97.6 F (36.4 C) 98.1 F (36.7 C)  TempSrc: Oral Oral Oral Oral  SpO2: 100% 99% 100% 100%  Weight:      Height:        Intake/Output Summary (Last 24 hours) at 05/04/2021 1734 Last data filed at 05/04/2021 0215 Gross per 24 hour  Intake 600 ml  Output 450 ml  Net 150 ml    Filed Weights   04/30/21 0759 04/30/21 1146  Weight: (!) 222.3 kg (!) 220 kg    Examination:  General exam: NAD Respiratory system: BL air Movement, distant heart sounds.  Cardiovascular system: S 1, S 2 RRR Gastrointestinal system: BS present, soft, nt Central nervous system: alert, conversant.  Extremities: edema   Data Reviewed: I have personally reviewed following labs and imaging studies  CBC: Recent Labs  Lab 04/28/21 0631 04/30/21 0817 05/02/21 1251 05/04/21 0011  WBC 9.4 8.6 7.4 8.5  HGB 10.1* 9.6* 10.0* 10.6*  HCT 31.6* 30.4* 32.6* 33.5*  MCV 96.3 97.1 97.0 96.5  PLT 486* 482* 461* 406*    Basic Metabolic Panel: Recent Labs  Lab 04/28/21 0631 04/30/21 0817 05/02/21 1251 05/02/21 1944 05/03/21 0041 05/04/21 0011  NA 129* 131* 127* 130* 129* 130*  K 5.0 4.9 4.3 4.2 4.0 4.2  CL 93* 93* 91* 93* 94* 95*  CO2 '27 27 27 25 25 26  '$ GLUCOSE 88 117* 91 109* 123* 105*  BUN 57* 53* 38* 21* 22* 31*  CREATININE 3.14* 3.03* 4.23* 3.16* 3.65*  4.35*  CALCIUM 9.8 9.9 9.8 9.5 9.7 9.8  PHOS 6.7* 5.0* 5.3*  --   --   --     GFR: Estimated Creatinine Clearance: 44.2 mL/min (A) (by C-G formula based on SCr of 4.35 mg/dL (H)). Liver Function Tests: Recent Labs  Lab 04/28/21 0631 04/30/21 0817 05/02/21 1251  ALBUMIN 2.7* 2.6* 2.7*    No results for input(s): LIPASE, AMYLASE in the last 168 hours. No results for input(s): AMMONIA in the last 168 hours. Coagulation Profile: No results for input(s): INR, PROTIME in the last 168 hours.  Cardiac Enzymes: No results for input(s): CKTOTAL, CKMB, CKMBINDEX, TROPONINI in the last 168 hours. BNP (last 3 results) No results for input(s): PROBNP in the last 8760 hours. HbA1C: No results for input(s): HGBA1C in the last 72 hours. CBG: No results for  input(s): GLUCAP in the last 168 hours. Lipid Profile: No results for input(s): CHOL, HDL, LDLCALC, TRIG, CHOLHDL, LDLDIRECT in the last 72 hours. Thyroid Function Tests: No results for input(s): TSH, T4TOTAL, FREET4, T3FREE, THYROIDAB in the last 72 hours. Anemia Panel: No results for input(s): VITAMINB12, FOLATE, FERRITIN, TIBC, IRON, RETICCTPCT in the last 72 hours. Sepsis Labs: No results for input(s): PROCALCITON, LATICACIDVEN in the last 168 hours.  No results found for this or any previous visit (from the past 240 hour(s)).       Radiology Studies: No results found.      Scheduled Meds:  allopurinol  100 mg Oral Daily   amiodarone  200 mg Oral Daily   apixaban  5 mg Oral BID   bisacodyl  10 mg Rectal Once   Chlorhexidine Gluconate Cloth  6 each Topical Daily   darbepoetin (ARANESP) injection - DIALYSIS  150 mcg Intravenous Q Thu-HD   lanthanum  1,000 mg Oral TID WC   linaclotide  145 mcg Oral QAC breakfast   midodrine  20 mg Oral TID WC   polyethylene glycol  17 g Oral BID   Continuous Infusions:  sodium chloride Stopped (04/23/21 0830)   anticoagulant sodium citrate     anticoagulant sodium citrate       ceFAZolin (ANCEF) IV 1 g (05/04/21 1612)     LOS: 18 days    Time spent: 35 minutes.     Elmarie Shiley, MD [facility]   If 7PM-7AM, please contact night-coverage [email or web address]  05/04/2021, 5:34 PM

## 2021-05-04 NOTE — Plan of Care (Signed)
  Problem: Education: Goal: Knowledge of General Education information will improve Description: Including pain rating scale, medication(s)/side effects and non-pharmacologic comfort measures Outcome: Progressing   Problem: Clinical Measurements: Goal: Ability to maintain clinical measurements within normal limits will improve Outcome: Progressing Goal: Will remain free from infection Outcome: Progressing Goal: Respiratory complications will improve Outcome: Progressing   Problem: Activity: Goal: Risk for activity intolerance will decrease Outcome: Progressing   Problem: Nutrition: Goal: Adequate nutrition will be maintained Outcome: Progressing   Problem: Pain Managment: Goal: General experience of comfort will improve Outcome: Progressing

## 2021-05-05 ENCOUNTER — Inpatient Hospital Stay (HOSPITAL_COMMUNITY): Payer: BC Managed Care – PPO

## 2021-05-05 ENCOUNTER — Inpatient Hospital Stay (HOSPITAL_COMMUNITY): Payer: BC Managed Care – PPO | Admitting: Certified Registered Nurse Anesthetist

## 2021-05-05 ENCOUNTER — Encounter (HOSPITAL_COMMUNITY)
Admission: EM | Disposition: A | Discharge status: Home / Self Care | Payer: Self-pay | Source: Home / Self Care | Attending: Internal Medicine

## 2021-05-05 ENCOUNTER — Encounter (HOSPITAL_COMMUNITY): Payer: Self-pay | Admitting: Internal Medicine

## 2021-05-05 DIAGNOSIS — I340 Nonrheumatic mitral (valve) insufficiency: Secondary | ICD-10-CM

## 2021-05-05 DIAGNOSIS — B9561 Methicillin susceptible Staphylococcus aureus infection as the cause of diseases classified elsewhere: Secondary | ICD-10-CM | POA: Diagnosis not present

## 2021-05-05 DIAGNOSIS — R7881 Bacteremia: Secondary | ICD-10-CM | POA: Diagnosis not present

## 2021-05-05 HISTORY — PX: TEE WITHOUT CARDIOVERSION: SHX5443

## 2021-05-05 SURGERY — ECHOCARDIOGRAM, TRANSESOPHAGEAL
Anesthesia: General

## 2021-05-05 MED ORDER — PROMETHAZINE HCL 25 MG/ML IJ SOLN
INTRAMUSCULAR | Status: DC | PRN
  Administered 2021-05-05: 12.5 mg via INTRAVENOUS

## 2021-05-05 MED ORDER — FENTANYL CITRATE (PF) 100 MCG/2ML IJ SOLN
INTRAMUSCULAR | Status: DC | PRN
  Administered 2021-05-05: 100 ug via INTRAVENOUS

## 2021-05-05 MED ORDER — ONDANSETRON HCL 4 MG/2ML IJ SOLN
INTRAMUSCULAR | Status: DC | PRN
  Administered 2021-05-05: 4 mg via INTRAVENOUS

## 2021-05-05 MED ORDER — PROPOFOL 10 MG/ML IV BOLUS
INTRAVENOUS | Status: DC | PRN
  Administered 2021-05-05: 150 mg via INTRAVENOUS

## 2021-05-05 MED ORDER — SUCCINYLCHOLINE CHLORIDE 200 MG/10ML IV SOSY
PREFILLED_SYRINGE | INTRAVENOUS | Status: DC | PRN
  Administered 2021-05-05: 200 mg via INTRAVENOUS

## 2021-05-05 MED ORDER — LIDOCAINE 2% (20 MG/ML) 5 ML SYRINGE
INTRAMUSCULAR | Status: DC | PRN
  Administered 2021-05-05: 100 mg via INTRAVENOUS

## 2021-05-05 NOTE — Anesthesia Preprocedure Evaluation (Addendum)
Anesthesia Evaluation  Patient identified by MRN, date of birth, ID band Patient awake    Reviewed: Allergy & Precautions, NPO status , Patient's Chart, lab work & pertinent test results  Airway Mallampati: III  TM Distance: >3 FB Neck ROM: Full    Dental  (+) Teeth Intact, Dental Advisory Given   Pulmonary sleep apnea , former smoker,    Pulmonary exam normal breath sounds clear to auscultation       Cardiovascular hypertension, +CHF  Normal cardiovascular exam+ dysrhythmias Atrial Fibrillation  Rhythm:Regular Rate:Normal  TTE 2022 1. Left ventricular ejection fraction, by estimation, is 25 to 30%. The  left ventricle has severely decreased function. The left ventricle  demonstrates global hypokinesis. The left ventricular internal cavity size  was moderately dilated. Left  ventricular diastolic parameters are indeterminate.  2. Right ventricular systolic function is normal. The right ventricular  size is normal. There is moderately elevated pulmonary artery systolic  pressure. The estimated right ventricular systolic pressure is 0000000 mmHg.  3. The mitral valve is normal in structure. Trivial mitral valve  regurgitation. No evidence of mitral stenosis.  4. The aortic valve is normal in structure. Aortic valve regurgitation is  not visualized. No aortic stenosis is present.  5. The inferior vena cava is normal in size with greater than 50%  respiratory variability, suggesting right atrial pressure of 3 mmHg.    Neuro/Psych negative neurological ROS  negative psych ROS   GI/Hepatic Neg liver ROS, GERD  ,  Endo/Other  Morbid obesity (BMI 68)  Renal/GU ESRF and DialysisRenal diseaseLab Results      Component                Value               Date                      CREATININE               4.35 (H)            05/04/2021                BUN                      31 (H)              05/04/2021                NA                        130 (L)             05/04/2021                K                        4.2                 05/04/2021                CL                       95 (L)              05/04/2021                CO2  26                  05/04/2021             negative genitourinary   Musculoskeletal negative musculoskeletal ROS (+)   Abdominal   Peds  Hematology  (+) Blood dyscrasia (on eliquis), anemia , Lab Results      Component                Value               Date                      WBC                      8.5                 05/04/2021                HGB                      10.6 (L)            05/04/2021                HCT                      33.5 (L)            05/04/2021                MCV                      96.5                05/04/2021                PLT                      406 (H)             05/04/2021              Anesthesia Other Findings   Reproductive/Obstetrics                          Anesthesia Physical Anesthesia Plan  ASA: 4  Anesthesia Plan: General   Post-op Pain Management:    Induction: Intravenous and Rapid sequence  PONV Risk Score and Plan: 2 and Ondansetron and Treatment may vary due to age or medical condition  Airway Management Planned: Oral ETT and Video Laryngoscope Planned  Additional Equipment:   Intra-op Plan:   Post-operative Plan: Extubation in OR  Informed Consent: I have reviewed the patients History and Physical, chart, labs and discussed the procedure including the risks, benefits and alternatives for the proposed anesthesia with the patient or authorized representative who has indicated his/her understanding and acceptance.     Dental advisory given  Plan Discussed with: CRNA  Anesthesia Plan Comments:        Anesthesia Quick Evaluation

## 2021-05-05 NOTE — Anesthesia Postprocedure Evaluation (Signed)
Anesthesia Post Note  Patient: Phillip Young  Procedure(s) Performed: TRANSESOPHAGEAL ECHOCARDIOGRAM (TEE)     Patient location during evaluation: PACU Anesthesia Type: General Level of consciousness: awake and alert, oriented and patient cooperative Pain management: pain level controlled Vital Signs Assessment: post-procedure vital signs reviewed and stable Respiratory status: spontaneous breathing, nonlabored ventilation and respiratory function stable Cardiovascular status: blood pressure returned to baseline and stable Postop Assessment: no apparent nausea or vomiting Anesthetic complications: no   No notable events documented.  Last Vitals:  Vitals:   05/05/21 0732 05/05/21 0923  BP: (!) 135/51 126/82  Pulse: 80 89  Resp: 14 17  Temp: 36.5 C 36.7 C  SpO2: 100% 93%    Last Pain:  Vitals:   05/05/21 0923  TempSrc:   PainSc: 10-Worst pain ever   Pain Goal: Patients Stated Pain Goal: 0 (05/04/21 2059)                 Pervis Hocking

## 2021-05-05 NOTE — Progress Notes (Signed)
  Echocardiogram TEE has been performed.  Phillip Young F 05/05/2021, 9:24 AM

## 2021-05-05 NOTE — Progress Notes (Signed)
Patient returned from PACU after TEE Call from [facility] in HD regarding whether patient will do dialysis. Patient states no, states he needs to recover from his procedure. Will notify MDs Regalado [name] and Doctor, hospital.

## 2021-05-05 NOTE — Progress Notes (Signed)
Patient returned from PACU with successful TEE free of vegetation. Patient settled back into room, writer offered to get daily medications for patient and he refused "I'll take them later"

## 2021-05-05 NOTE — Progress Notes (Signed)
PT Cancellation Note  Patient Details Name: Phillip Young MRN: [id number] DOB: 1985-09-14   Cancelled Treatment:    Reason Eval/Treat Not Completed: Patient at procedure or test/unavailable. Will follow-up for PT treatment as schedule permits.  Mabeline Caras, PT, DPT Acute Rehabilitation Services  Pager [phone] Office [place] 05/05/2021, 7:35 AM

## 2021-05-05 NOTE — Plan of Care (Signed)
  Problem: Education: Goal: Knowledge of General Education information will improve Description: Including pain rating scale, medication(s)/side effects and non-pharmacologic comfort measures Outcome: Not Progressing   Problem: Activity: Goal: Risk for activity intolerance will decrease Outcome: Not Progressing   Problem: Skin Integrity: Goal: Risk for impaired skin integrity will decrease Outcome: Not Progressing

## 2021-05-05 NOTE — Anesthesia Procedure Notes (Signed)
Procedure Name: Intubation Date/Time: 05/05/2021 8:48 AM Performed by: Griffin Dakin, CRNA Pre-anesthesia Checklist: Patient identified, Emergency Drugs available, Suction available and Patient being monitored Patient Re-evaluated:Patient Re-evaluated prior to induction Oxygen Delivery Method: Circle system utilized Preoxygenation: Pre-oxygenation with 100% oxygen Induction Type: IV induction Ventilation: Mask ventilation without difficulty Laryngoscope Size: Glidescope and 4 Grade View: Grade II Tube type: Oral Tube size: 7.5 mm Number of attempts: 1 Airway Equipment and Method: Rigid stylet and Video-laryngoscopy Placement Confirmation: ETT inserted through vocal cords under direct vision, positive ETCO2 and breath sounds checked- equal and bilateral Secured at: 25 cm Tube secured with: Tape Dental Injury: Teeth and Oropharynx as per pre-operative assessment  Difficulty Due To: Difficulty was anticipated

## 2021-05-05 NOTE — Progress Notes (Signed)
Pt refused cpap for tonight 

## 2021-05-05 NOTE — Progress Notes (Signed)
[facility] Progress Note   Subjective:  Seen in room. TEE this am. Feels terrible today, diffuse pain, neck pain. Does not want to do dialysis today but is willing to do it either later tonight or tomorrow if feasible.  Objective Vitals:   05/05/21 0428 05/05/21 0732 05/05/21 0923 05/05/21 1139  BP: 115/83 (!) 135/51 126/82 125/73  Pulse: 83 80 89 86  Resp: (!) '21 14 17 18  '$ Temp: 97.6 F (36.4 C) 97.7 F (36.5 C) 98.1 F (36.7 C) 98 F (36.7 C)  TempSrc: Axillary Temporal  Oral  SpO2: 95% 100% 93% 96%  Weight:      Height:       Physical Exam General: Obese man, NAD. Nasal O2 in place. Heart: RRR; no murmur Lungs: CTA anteriorly Abdomen: soft, non-tender Extremities: No LE edema Neuro: drowsy but awake/alert Dialysis Access:  [facility] in R chest  Additional Objective Labs: Basic Metabolic Panel: Recent Labs  Lab 04/30/21 0817 05/02/21 1251 05/02/21 1944 05/03/21 0041 05/04/21 0011  NA 131* 127* 130* 129* 130*  K 4.9 4.3 4.2 4.0 4.2  CL 93* 91* 93* 94* 95*  CO2 '27 27 25 25 26  '$ GLUCOSE 117* 91 109* 123* 105*  BUN 53* 38* 21* 22* 31*  CREATININE 3.03* 4.23* 3.16* 3.65* 4.35*  CALCIUM 9.9 9.8 9.5 9.7 9.8  PHOS 5.0* 5.3*  --   --   --    Liver Function Tests: Recent Labs  Lab 04/30/21 0817 05/02/21 1251  ALBUMIN 2.6* 2.7*   CBC: Recent Labs  Lab 04/30/21 0817 05/02/21 1251 05/04/21 0011  WBC 8.6 7.4 8.5  HGB 9.6* 10.0* 10.6*  HCT 30.4* 32.6* 33.5*  MCV 97.1 97.0 96.5  PLT 482* 461* 406*   Studies/Results: No results found.  Medications:  anticoagulant sodium citrate     anticoagulant sodium citrate      ceFAZolin (ANCEF) IV 1 g (05/04/21 1612)    allopurinol  100 mg Oral Daily   amiodarone  200 mg Oral Daily   apixaban  5 mg Oral BID   bisacodyl  10 mg Rectal Once   Chlorhexidine Gluconate Cloth  6 each Topical Daily   darbepoetin (ARANESP) injection - DIALYSIS  150 mcg Intravenous Q Thu-HD   lanthanum  1,000 mg Oral TID WC    linaclotide  145 mcg Oral QAC breakfast   midodrine  20 mg Oral TID WC   polyethylene glycol  17 g Oral BID    Dialysis Orders: TTS/ AKI [facility] 4h  400/800    188.5kg   2/2 bath  TDC RIJ  Hep none  - no in center meds  Assessment/Plan: MSSA bacteremia: Blood Cx 9/22 + and catheter exit site purulence. S/p [facility] removal/line holiday 9/24 -> 9/28 new TDC placed. Has assoc left-sided SI joint septic athritis w/ local osteo and 2 associated small abscesses. Per ortho too high risk for surgical Rx. Per IR abscesses are small, would need IV contrast CT to attempt drain and even with that they may not be amenable to drainage; so drainage is not being pursued. On IV Cefazolin x 6 week course (thru 11/5). S/p TEE 10/11. Per primary service Volume: improved, uf as tolerated on hd ESRD: on HD TTS. Next HD either tonight or tomorrow. History of biventricular congestive heart failure: Not a candidate for LVAD or transplant, per notes. Hypotension: Stable on midodrine '20mg'$  TID. Atrial fibrillation: On amiodarone + Eliquis. CKD MBD: COrrCa high, Phos were high -> increased  Fosrenol binders to '1000mg'$  TID. PTH was 42. Anemia of chronic kidney disease: Hgb 9- 11 - continue Aranesp 176mg weekly.  Hyponatremia: managing with HD, 137Na bath, Uf'ing as tolerated  VGean Quint MD [facility]10/05/2021, 11:47 AM

## 2021-05-05 NOTE — Transfer of Care (Signed)
Immediate Anesthesia Transfer of Care Note  Patient: Page Ramaglia  Procedure(s) Performed: TRANSESOPHAGEAL ECHOCARDIOGRAM (TEE)  Patient Location: PACU  Anesthesia Type:General  Level of Consciousness: awake, alert  and oriented  Airway & Oxygen Therapy: Patient Spontanous Breathing and Patient connected to nasal cannula oxygen  Post-op Assessment: Report given to RN and Post -op Vital signs reviewed and stable  Post vital signs: Reviewed and stable  Last Vitals:  Vitals Value Taken Time  BP 126/82 05/05/21 0923  Temp    Pulse 98 05/05/21 0924  Resp 18 05/05/21 0924  SpO2 96 % 05/05/21 0924  Vitals shown include unvalidated device data.  Last Pain:  Vitals:   05/05/21 0732  TempSrc: Temporal  PainSc: 9       Patients Stated Pain Goal: 0 ([id number])  Complications: No notable events documented.

## 2021-05-05 NOTE — Progress Notes (Signed)
Pt educated re chg [place] last night and this morning , refused both times, kept npo since Mn.ivfin progress to left  forearm iv .

## 2021-05-05 NOTE — CV Procedure (Signed)
Procedure: TEE  Indication: MSSA bacteremia  Sedation: Per anesthesiology (patient was intubated)  Findings: Please see echo section for full report.  Moderately dilated LV with mild concentric LV hypertrophy.  EF 20%, diffuse hypokinesis.  Mildly dilated RV with moderately decreased systolic function.  Moderate left atrial enlargement with no LA appendage thrombus.  Normal right atrium. HD catheter in SVC, no vegetation noted. No PFO/ASD by color doppler.  Mild TR, peak RV-RA gradient 43 mmHg.  No TV vegetation.  No PV vegetation.  Trileaflet aortic valve with no stenosis or regurgitation, no vegetation.  Mild mitral regurgitation, no vegetation.   No evidence for endocarditis.   [name] 05/05/2021 9:07 AM

## 2021-05-05 NOTE — Interval H&P Note (Signed)
History and Physical Interval Note:  05/05/2021 8:46 AM  Phillip Young  has presented today for surgery, with the diagnosis of BACTEREMIA.  The various methods of treatment have been discussed with the patient and family. After consideration of risks, benefits and other options for treatment, the patient has consented to  Procedure(s): TRANSESOPHAGEAL ECHOCARDIOGRAM (TEE) (N/A) as a surgical intervention.  The patient's history has been reviewed, patient examined, no change in status, stable for surgery.  I have reviewed the patient's chart and labs.  Questions were answered to the patient's satisfaction.     Phillip Young

## 2021-05-05 NOTE — Progress Notes (Signed)
PROGRESS NOTE    Phillip Young  [id number] DOB: [birth year] DOA: 04/12/2021 PCP: [name], MD   Brief Narrative: 35 year old with past medical history significant for severe biventricular systolic heart failure with known ejection fraction less than 20%, diabetes type 2, paroxysmal a flutter on Eliquis, recently started on hemodialysis with recent extensive hospitalization with cardiogenic shock and cardiorenal syndrome progressed to ESRD presented back to the emergency room with shortness of breath, left wrist and right knee pain, unable to ambulate.  Home oxygen concentrator not working.  Chest x-ray showed pulmonary edema, COVID-19 negative.  Patient was found to have MSSA bacteremia due to infected dialysis catheter.  ID and nephrology consulted.  After catheter holiday he received new permanent cath on 9/28 and restarted on hemodialysis. Patient has been declining treatments for the last week on and off.  He now has agreed to dialysis and MRI to evaluate hip pain.   Assessment & Plan:   Principal Problem:   MSSA bacteremia Active Problems:   Morbid obesity ([place])   GERD without esophagitis   OSA (obstructive sleep apnea)   Chronic systolic CHF (congestive heart failure) ([name])   Paroxysmal atrial flutter ([name])   Hyponatremia   ESRD (end stage renal disease) on dialysis ([facility])   Acute on chronic respiratory failure with hypoxia ([name])   Medical non-compliance   Hypotension   ESRD on dialysis [facility])   Palliative care by specialist   Full code status   Concern about end of life   1-MSSA Bacteremia; Arthralgia;  CT left Hip 9/25 destructive changes.  CT scan right knee no acute finding.  Blood culture 9/22. Positive for MSSA.  ECHO; no evidence of vegetation.  Perm cath removed ID consulted, recommend IV antibiotics for 6 weeks. Until Nov 6th.  On pain medication: dilaudid and Oxy.  Underwent TEE 10/11 which was negative for endocarditis.  Repeated Blood cultures  negative Stable on IV antibiotics.   Left SI septic arthritis, sacrum and iliac bone osteomyelitis elongated abscess involving the left iliacus muscle, small abscess left piriformis muscle.  -Ortho consulted. Patient is too high risk for surgery. Per IR abscess is small and even with CT scan, it is in a location that may not be amenable for drainage, for this reason and to avoid to give more contrast to patient to prevent worsening to any residual  renal function he has left wont proceed with CT scan or procedure. Per ortho there is not significant therapeutic improvement for mobility with drainage of abscess.  -plan to treat with IV antibiotics. He will need Imagine after completion of antibiotics.  -pain improving.  Needs rehab  Acute on chronic respiratory failure with hypoxemia and hypercapnia: Due to malfunctioning concentrator at home now with fluid overload Volume managed with hemodialysis. Nephrology following.   ESRD on hemodialysis: Had a new PermCath He has been declining dialysis on and off.  Was evaluated by palliative care, patient agreed to have hemodialysis and further treatments.  Plan to monitor urine out put.   Biventricular heart failure, paroxysmal A. fib, hypotension: Action fraction less than 20%.  Followed by heart failure team.  Intolerance to advanced heart failure therapies.  On midodrine 20 3 times daily Volume managed with hemodialysis. On amiodarone for paroxysmal A. fib.  Morbid obesity: BMI more than 50. On Semaglutide.  Hyponatremia; volume manage with HD.   Constipation; had BM      Estimated body mass index is 67.64 kg/m as calculated from the following:   Height as  of this encounter: '5\' 11"'$  (1.803 m).   Weight as of this encounter: 220 kg.   DVT prophylaxis: Eliquis Code Status: Full Code Family Communication:  Disposition Plan:  Status is: Inpatient  Remains inpatient appropriate because:IV treatments appropriate due to intensity of  illness or inability to take PO  Dispo: The patient is from: Home              Anticipated d/c is to: SNF              Patient currently is  medically stable to d/c.    Difficult to place patient No        Consultants:  ID Nephrology   Procedures:  HD  Antimicrobials:  Cefazolin   Subjective: He came from TEE. Does not feels well report sore throat and neck pain  He is willing to have HD later or tomorrow.    Objective: Vitals:   05/05/21 0428 05/05/21 0732 05/05/21 0923 05/05/21 1139  BP: 115/83 (!) 135/51 126/82 125/73  Pulse: 83 80 89 86  Resp: (!) '21 14 17 18  '$ Temp: 97.6 F (36.4 C) 97.7 F (36.5 C) 98.1 F (36.7 C) 98 F (36.7 C)  TempSrc: Axillary Temporal  Oral  SpO2: 95% 100% 93% 96%  Weight:      Height:        Intake/Output Summary (Last 24 hours) at 05/05/2021 1333 Last data filed at 05/05/2021 1232 Gross per 24 hour  Intake 1362 ml  Output 550 ml  Net 812 ml    Filed Weights   04/30/21 0759 04/30/21 1146  Weight: (!) 222.3 kg (!) 220 kg    Examination:  General exam: NAD Respiratory system: BL air movement.  Cardiovascular system: S 1,  S 2 RRR Gastrointestinal system: BS present, soft, nt Central nervous system: alert,  Extremities: edema   Data Reviewed: I have personally reviewed following labs and imaging studies  CBC: Recent Labs  Lab 04/30/21 0817 05/02/21 1251 05/04/21 0011  WBC 8.6 7.4 8.5  HGB 9.6* 10.0* 10.6*  HCT 30.4* 32.6* 33.5*  MCV 97.1 97.0 96.5  PLT 482* 461* 406*    Basic Metabolic Panel: Recent Labs  Lab 04/30/21 0817 05/02/21 1251 05/02/21 1944 05/03/21 0041 05/04/21 0011  NA 131* 127* 130* 129* 130*  K 4.9 4.3 4.2 4.0 4.2  CL 93* 91* 93* 94* 95*  CO2 '27 27 25 25 26  '$ GLUCOSE 117* 91 109* 123* 105*  BUN 53* 38* 21* 22* 31*  CREATININE 3.03* 4.23* 3.16* 3.65* 4.35*  CALCIUM 9.9 9.8 9.5 9.7 9.8  PHOS 5.0* 5.3*  --   --   --     GFR: Estimated Creatinine Clearance: 44.2 mL/min (A) (by  C-G formula based on SCr of 4.35 mg/dL (H)). Liver Function Tests: Recent Labs  Lab 04/30/21 0817 05/02/21 1251  ALBUMIN 2.6* 2.7*    No results for input(s): LIPASE, AMYLASE in the last 168 hours. No results for input(s): AMMONIA in the last 168 hours. Coagulation Profile: No results for input(s): INR, PROTIME in the last 168 hours.  Cardiac Enzymes: No results for input(s): CKTOTAL, CKMB, CKMBINDEX, TROPONINI in the last 168 hours. BNP (last 3 results) No results for input(s): PROBNP in the last 8760 hours. HbA1C: No results for input(s): HGBA1C in the last 72 hours. CBG: No results for input(s): GLUCAP in the last 168 hours. Lipid Profile: No results for input(s): CHOL, HDL, LDLCALC, TRIG, CHOLHDL, LDLDIRECT in the last 72 hours. Thyroid Function  Tests: No results for input(s): TSH, T4TOTAL, FREET4, T3FREE, THYROIDAB in the last 72 hours. Anemia Panel: No results for input(s): VITAMINB12, FOLATE, FERRITIN, TIBC, IRON, RETICCTPCT in the last 72 hours. Sepsis Labs: No results for input(s): PROCALCITON, LATICACIDVEN in the last 168 hours.  No results found for this or any previous visit (from the past 240 hour(s)).       Radiology Studies: No results found.      Scheduled Meds:  allopurinol  100 mg Oral Daily   amiodarone  200 mg Oral Daily   apixaban  5 mg Oral BID   bisacodyl  10 mg Rectal Once   Chlorhexidine Gluconate Cloth  6 each Topical Daily   darbepoetin (ARANESP) injection - DIALYSIS  150 mcg Intravenous Q Thu-HD   lanthanum  1,000 mg Oral TID WC   linaclotide  145 mcg Oral QAC breakfast   midodrine  20 mg Oral TID WC   polyethylene glycol  17 g Oral BID   Continuous Infusions:  anticoagulant sodium citrate     anticoagulant sodium citrate      ceFAZolin (ANCEF) IV 1 g (05/04/21 1612)     LOS: 19 days    Time spent: 35 minutes.     Elmarie Shiley, MD [facility]   If 7PM-7AM, please contact  night-coverage [email or web address]  05/05/2021, 1:33 PM

## 2021-05-06 DIAGNOSIS — N186 End stage renal disease: Secondary | ICD-10-CM | POA: Diagnosis not present

## 2021-05-06 DIAGNOSIS — I5023 Acute on chronic systolic (congestive) heart failure: Secondary | ICD-10-CM | POA: Diagnosis not present

## 2021-05-06 DIAGNOSIS — R7881 Bacteremia: Secondary | ICD-10-CM | POA: Diagnosis not present

## 2021-05-06 DIAGNOSIS — B9561 Methicillin susceptible Staphylococcus aureus infection as the cause of diseases classified elsewhere: Secondary | ICD-10-CM | POA: Diagnosis not present

## 2021-05-06 LAB — CBC
HCT: 33.4 % — ABNORMAL LOW (ref 39.0–52.0)
Hemoglobin: 10.1 g/dL — ABNORMAL LOW (ref 13.0–17.0)
MCH: 29.8 pg (ref 26.0–34.0)
MCHC: 30.2 g/dL (ref 30.0–36.0)
MCV: 98.5 fL (ref 80.0–100.0)
Platelets: 399 10*3/uL (ref 150–400)
RBC: 3.39 MIL/uL — ABNORMAL LOW (ref 4.22–5.81)
RDW: 17.3 % — ABNORMAL HIGH (ref 11.5–15.5)
WBC: 7.4 10*3/uL (ref 4.0–10.5)
nRBC: 0 % (ref 0.0–0.2)

## 2021-05-06 LAB — RENAL FUNCTION PANEL
Albumin: 2.8 g/dL — ABNORMAL LOW (ref 3.5–5.0)
Anion gap: 11 (ref 5–15)
BUN: 37 mg/dL — ABNORMAL HIGH (ref 6–20)
CO2: 26 mmol/L (ref 22–32)
Calcium: 10 mg/dL (ref 8.9–10.3)
Chloride: 96 mmol/L — ABNORMAL LOW (ref 98–111)
Creatinine, Ser: 3.74 mg/dL — ABNORMAL HIGH (ref 0.61–1.24)
GFR, Estimated: 21 mL/min — ABNORMAL LOW (ref >60)
Glucose, Bld: 100 mg/dL — ABNORMAL HIGH (ref 70–99)
Phosphorus: 6.4 mg/dL — ABNORMAL HIGH (ref 2.5–4.6)
Potassium: 4.5 mmol/L (ref 3.5–5.1)
Sodium: 133 mmol/L — ABNORMAL LOW (ref 135–145)

## 2021-05-06 MED ORDER — DOCUSATE SODIUM 100 MG PO CAPS
100.0000 mg | ORAL_CAPSULE | Freq: Two times a day (BID) | ORAL | Status: DC
  Administered 2021-05-09 – 2021-05-19 (×12): 100 mg via ORAL
  Filled 2021-05-06 (×18): qty 1

## 2021-05-06 NOTE — Plan of Care (Signed)
  Problem: Education: Goal: Knowledge of General Education information will improve Description: Including pain rating scale, medication(s)/side effects and non-pharmacologic comfort measures Outcome: Progressing   Problem: Health Behavior/Discharge Planning: Goal: Ability to manage health-related needs will improve Outcome: Progressing   Problem: Clinical Measurements: Goal: Ability to maintain clinical measurements within normal limits will improve Outcome: Progressing Goal: Will remain free from infection Outcome: Progressing Goal: Diagnostic test results will improve Outcome: Progressing   Problem: Activity: Goal: Risk for activity intolerance will decrease Outcome: Progressing   Problem: Pain Managment: Goal: General experience of comfort will improve Outcome: Progressing

## 2021-05-06 NOTE — TOC CM/SW Note (Signed)
HF TOC CM spoke to [facility] and they are unable to accept due to pt current weight. Pt has to be 400 lbs or less. Contacted [facility] and they do not have dialysis bed. She will call [facility] in [facility] for possible LTAC bed for dialysis patient. [place], Heart Failure TOC CM [phone]

## 2021-05-06 NOTE — Progress Notes (Signed)
PT Cancellation Note  Patient Details Name: Phillip Young MRN: [id number] DOB: Dec 28, 1985   Cancelled Treatment:    Reason Eval/Treat Not Completed: Patient at procedure or test/unavailable (HD). Will follow-up for PT treatment as schedule permits.  Mabeline Caras, PT, DPT Acute Rehabilitation Services  Pager [phone] Office [place] 05/06/2021, 8:55 AM

## 2021-05-06 NOTE — Progress Notes (Signed)
PROGRESS NOTE    Phillip Young  [id number] DOB: 06-20-1986 DOA: 04/12/2021 PCP: [name], MD   Brief Narrative: 35 year old with past medical history significant for severe biventricular systolic heart failure with known ejection fraction less than 20%, diabetes type 2, paroxysmal a flutter on Eliquis, recently started on hemodialysis with recent extensive hospitalization with cardiogenic shock and cardiorenal syndrome progressed to ESRD presented back to the emergency room with shortness of breath, left wrist and right knee pain, unable to ambulate.  Home oxygen concentrator not working.  Chest x-ray showed pulmonary edema, COVID-19 negative.  Patient was found to have MSSA bacteremia due to infected dialysis catheter.  ID and nephrology consulted.  After catheter holiday he received new permanent cath on 9/28 and restarted on hemodialysis. Patient has been declining treatments for the last week on and off.  He now has agreed to dialysis and MRI to evaluate hip pain.   Assessment & Plan:   Principal Problem:   MSSA bacteremia Active Problems:   Morbid obesity ([place])   GERD without esophagitis   OSA (obstructive sleep apnea)   Chronic systolic CHF (congestive heart failure) ([name])   Paroxysmal atrial flutter ([name])   Hyponatremia   ESRD (end stage renal disease) on dialysis ([place])   Acute on chronic respiratory failure with hypoxia ([name])   Medical non-compliance   Hypotension   ESRD on dialysis [facility])   Palliative care by specialist   Full code status   Concern about end of life   1-MSSA Bacteremia; Arthralgia;  CT left Hip 9/25 destructive changes.  CT scan right knee no acute finding.  Blood culture 9/22. Positive for MSSA.  ECHO; no evidence of vegetation.  Perm cath removed ID consulted, recommend IV antibiotics for 6 weeks. Until Nov 6th.  On pain medication: dilaudid and Oxy.  Pain very well controlled. He was extremely reluctant but then allowed and  eventually underwent TEE 10/11 which was negative for endocarditis.  Repeated Blood cultures from 04/19/2021 NTD. Stable on IV antibiotics.   Left SI septic arthritis, sacrum and iliac bone osteomyelitis elongated abscess involving the left iliacus muscle, small abscess left piriformis muscle.  -Ortho consulted. Patient is too high risk for surgery. Per IR, abscess is small and even with CT scan, it is in a location that may not be amenable for drainage, for this reason and to avoid to give more contrast to patient to prevent worsening to any residual  renal function he has left wont proceed with CT scan or procedure. Per ortho there is not significant therapeutic improvement for mobility with drainage of abscess.  -plan to treat with IV antibiotics. He will need Imagine after completion of antibiotics.  Assessed by PT OT, needs SNF.  TOC on board.  Acute on chronic respiratory failure with hypoxemia and hypercapnia: Due to malfunctioning concentrator at home now with fluid overload Volume managed with hemodialysis. Nephrology following.  Currently on baseline oxygen requirement.  ESRD on hemodialysis: Had a new PermCath He has been declining dialysis on and off.  Was evaluated by palliative care, patient agreed to have hemodialysis and further treatments.   Biventricular heart failure, paroxysmal A. fib, hypotension: Ejection fraction less than 20%.  Followed by heart failure team.  Intolerance to advanced heart failure therapies.  On midodrine 20 3 times daily Volume managed with hemodialysis. On amiodarone 200 mg p.o. daily for paroxysmal A. fib.  Morbid obesity: BMI more than 50. On Semaglutide.   Hyponatremia; volume manage with HD.   Constipation;  had BM   Estimated body mass index is 67.64 kg/m as calculated from the following:   Height as of this encounter: '5\' 11"'$  (1.803 m).   Weight as of this encounter: 220 kg.   DVT prophylaxis: Eliquis Code Status: Full Code Family  Communication: None present. Disposition Plan:  Status is: Inpatient  Remains inpatient appropriate because:IV treatments appropriate due to intensity of illness or inability to take PO  Dispo: The patient is from: Home              Anticipated d/c is to: SNF              Patient currently is  medically stable to d/c.    Difficult to place patient yes   Consultants:  ID Nephrology   Procedures:  HD  Antimicrobials:  Cefazolin   Subjective: Patient seen and examined in the dialysis unit.  He complains of some pain at the left lateral thigh.  No other complaint.   Objective: Vitals:   05/06/21 1100 05/06/21 1130 05/06/21 1200 05/06/21 1209  BP: 137/78 128/68 124/69 (!) 119/58  Pulse: 85 78 79 78  Resp:    (!) 22  Temp:    98.1 F (36.7 C)  TempSrc:    Oral  SpO2:    100%  Height:        Intake/Output Summary (Last 24 hours) at 05/06/2021 1248 Last data filed at 05/06/2021 1209 Gross per 24 hour  Intake 590 ml  Output 3400 ml  Net -2810 ml    Filed Weights    Examination:  General exam: Appears calm and comfortable, obese Respiratory system: Diminished breath sounds in the bases bilaterally, respiratory effort normal. Cardiovascular system: S1 & S2 heard, RRR. No JVD, murmurs, rubs, gallops or clicks.  Trace pedal edema bilateral lower extremity. Gastrointestinal system: Abdomen is nondistended, soft and nontender. No organomegaly or masses felt. Normal bowel sounds heard. Central nervous system: Alert and oriented. No focal neurological deficits. Extremities: Symmetric 5 x 5 power. Skin: No rashes, lesions or ulcers.   Data Reviewed: I have personally reviewed following labs and imaging studies  CBC: Recent Labs  Lab 04/30/21 0817 05/02/21 1251 05/04/21 0011 05/06/21 0901  WBC 8.6 7.4 8.5 7.4  HGB 9.6* 10.0* 10.6* 10.1*  HCT 30.4* 32.6* 33.5* 33.4*  MCV 97.1 97.0 96.5 98.5  PLT 482* 461* 406* [id number]    Basic Metabolic Panel: Recent Labs  Lab  04/30/21 0817 05/02/21 1251 05/02/21 1944 05/03/21 0041 05/04/21 0011 05/06/21 0901  NA 131* 127* 130* 129* 130* 133*  K 4.9 4.3 4.2 4.0 4.2 4.5  CL 93* 91* 93* 94* 95* 96*  CO2 '27 27 25 25 26 26  '$ GLUCOSE 117* 91 109* 123* 105* 100*  BUN 53* 38* 21* 22* 31* 37*  CREATININE 3.03* 4.23* 3.16* 3.65* 4.35* 3.74*  CALCIUM 9.9 9.8 9.5 9.7 9.8 10.0  PHOS 5.0* 5.3*  --   --   --  6.4*    GFR: Estimated Creatinine Clearance: 51.4 mL/min (A) (by C-G formula based on SCr of 3.74 mg/dL (H)). Liver Function Tests: Recent Labs  Lab 04/30/21 0817 05/02/21 1251 05/06/21 0901  ALBUMIN 2.6* 2.7* 2.8*    No results for input(s): LIPASE, AMYLASE in the last 168 hours. No results for input(s): AMMONIA in the last 168 hours. Coagulation Profile: No results for input(s): INR, PROTIME in the last 168 hours.  Cardiac Enzymes: No results for input(s): CKTOTAL, CKMB, CKMBINDEX, TROPONINI in the last 168 hours.  BNP (last 3 results) No results for input(s): PROBNP in the last 8760 hours. HbA1C: No results for input(s): HGBA1C in the last 72 hours. CBG: No results for input(s): GLUCAP in the last 168 hours. Lipid Profile: No results for input(s): CHOL, HDL, LDLCALC, TRIG, CHOLHDL, LDLDIRECT in the last 72 hours. Thyroid Function Tests: No results for input(s): TSH, T4TOTAL, FREET4, T3FREE, THYROIDAB in the last 72 hours. Anemia Panel: No results for input(s): VITAMINB12, FOLATE, FERRITIN, TIBC, IRON, RETICCTPCT in the last 72 hours. Sepsis Labs: No results for input(s): PROCALCITON, LATICACIDVEN in the last 168 hours.  No results found for this or any previous visit (from the past 240 hour(s)).   Radiology Studies: No results found.   Scheduled Meds:  allopurinol  100 mg Oral Daily   amiodarone  200 mg Oral Daily   apixaban  5 mg Oral BID   bisacodyl  10 mg Rectal Once   Chlorhexidine Gluconate Cloth  6 each Topical Daily   darbepoetin (ARANESP) injection - DIALYSIS  150 mcg  Intravenous Q Thu-HD   lanthanum  1,000 mg Oral TID WC   linaclotide  145 mcg Oral QAC breakfast   midodrine  20 mg Oral TID WC   polyethylene glycol  17 g Oral BID   Continuous Infusions:  anticoagulant sodium citrate     anticoagulant sodium citrate      ceFAZolin (ANCEF) IV 1 g (05/05/21 1411)     LOS: 20 days   Time spent: 36 minutes.   Darliss Cheney, MD [facility]  If 7PM-7AM, please contact night-coverage [email or web address]  05/06/2021, 12:48 PM

## 2021-05-06 NOTE — Progress Notes (Signed)
OT Cancellation Note  Patient Details Name: Phillip Young MRN: [id number] DOB: 10-14-1985   Cancelled Treatment:    Reason Eval/Treat Not Completed: Patient at procedure or test/ unavailable Pt off unit for HD this AM. Will follow-up for OT session as schedule permits.  Layla Maw 05/06/2021, 9:04 AM

## 2021-05-06 NOTE — TOC Progression Note (Addendum)
Transition of Care [facility]) - Progression Note    Patient Details  Name: Phillip Young MRN: [id number] Date of Birth: 1986-04-27  Transition of Care [facility]) CM/SW Contact  Zenon Mayo, RN Phone Number: 05/06/2021, 3:32 PM  Clinical Narrative:    Per Raquel Sarna with [facility] they can not offer this patient a bed right now due to his wieght, he has to be less  than 400 lbs, they have no dialysis beds, and there are also three people ahead of him. Jennifer with [facility] states she spoke with someone yesterday and informed them that patient is not a candidate for their ltach.  This NCM informed Nathaniel Man of this information.  The MD states looks like he is more of a DTP patient. Patient states he lived with his sister on last hospital admit, she is a CNA, but after that he has been at his regular residence with his Uncle.  He has home oxygen with [employer] and a NIV.   He states he can not walk due to the pain from his abscess.   10/13- NCM asked physical therapy to put patient in [facility] program, so they can work with him daily to help get him moving to go home.  She states they will take a look at  him for this program.   Expected Discharge Plan: [place] Barriers to Discharge: Continued Medical Work up  Expected Discharge Plan and Services Expected Discharge Plan: [place] In-house Referral: Clinical Social Work Discharge Planning Services: CM Consult Post Acute Care Choice: [place] arrangements for the past 2 months: [place]                   DME Agency: [employer] Date DME Agency Contacted: 04/14/21 Time DME Agency Contacted: [phone] Representative spoke with at DME Agency: Andree Coss             Social Determinants of Health (SDOH) Interventions Food Insecurity Interventions: Assist with SNAP Application Financial Strain Interventions: Other (Comment) (Referral to [facility]) Housing Interventions:  Intervention Not Indicated Transportation Interventions: Intervention Not Indicated  Readmission Risk Interventions Readmission Risk Prevention Plan 10/16/2020 10/16/2020 10/14/2020  Transportation Screening Complete Complete Complete  PCP or Specialist Appt within 5-7 Days Complete Complete Complete  Home Care Screening Complete - -  Medication Review (RN CM) Referral to Pharmacy - -  Some recent data might be hidden

## 2021-05-06 NOTE — Progress Notes (Signed)
[facility] Progress Note   Subjective:  Seen in room. Feels better. Willing to do HD today. No complaints.  Objective Vitals:   05/05/21 1617 05/05/21 1944 05/05/21 2350 05/06/21 0313  BP: 122/77 104/68 (!) 120/98 99/69  Pulse:  86 78 84  Resp: 20 (!) '29 18 17  '$ Temp: 98 F (36.7 C) 97.8 F (36.6 C) 97.8 F (36.6 C) 97.6 F (36.4 C)  TempSrc: Oral Oral Oral Oral  SpO2: 97% 100% 96% 100%  Weight:      Height:       Physical Exam General: Obese, nad Heart: RRR; no murmur Lungs: CTA anteriorly Abdomen: soft, non-tender Extremities: No LE edema Neuro: awake, alert Dialysis Access:  TDC in R chest  Additional Objective Labs: Basic Metabolic Panel: Recent Labs  Lab 04/30/21 0817 05/02/21 1251 05/02/21 1944 05/03/21 0041 05/04/21 0011  NA 131* 127* 130* 129* 130*  K 4.9 4.3 4.2 4.0 4.2  CL 93* 91* 93* 94* 95*  CO2 '27 27 25 25 26  '$ GLUCOSE 117* 91 109* 123* 105*  BUN 53* 38* 21* 22* 31*  CREATININE 3.03* 4.23* 3.16* 3.65* 4.35*  CALCIUM 9.9 9.8 9.5 9.7 9.8  PHOS 5.0* 5.3*  --   --   --    Liver Function Tests: Recent Labs  Lab 04/30/21 0817 05/02/21 1251  ALBUMIN 2.6* 2.7*   CBC: Recent Labs  Lab 04/30/21 0817 05/02/21 1251 05/04/21 0011  WBC 8.6 7.4 8.5  HGB 9.6* 10.0* 10.6*  HCT 30.4* 32.6* 33.5*  MCV 97.1 97.0 96.5  PLT 482* 461* 406*   Studies/Results: No results found.  Medications:  anticoagulant sodium citrate     anticoagulant sodium citrate      ceFAZolin (ANCEF) IV 1 g (05/05/21 1411)    allopurinol  100 mg Oral Daily   amiodarone  200 mg Oral Daily   apixaban  5 mg Oral BID   bisacodyl  10 mg Rectal Once   Chlorhexidine Gluconate Cloth  6 each Topical Daily   darbepoetin (ARANESP) injection - DIALYSIS  150 mcg Intravenous Q Thu-HD   lanthanum  1,000 mg Oral TID WC   linaclotide  145 mcg Oral QAC breakfast   midodrine  20 mg Oral TID WC   polyethylene glycol  17 g Oral BID    Dialysis Orders: TTS/ AKI [facility] 4h  400/800    188.5kg   2/2 bath  TDC RIJ  Hep none  - no in center meds  Assessment/Plan: MSSA bacteremia: Blood Cx 9/22 + and catheter exit site purulence. S/p [facility] removal/line holiday 9/24 -> 9/28 new TDC placed. Has assoc left-sided SI joint septic athritis w/ local osteo and 2 associated small abscesses. Per ortho too high risk for surgical Rx. Per IR abscesses are small, would need IV contrast CT to attempt drain and even with that they may not be amenable to drainage; so drainage is not being pursued. On IV Cefazolin x 6 week course (thru 11/5). S/p TEE 10/11-neg for endocarditis. Per primary service Volume: improved, uf as tolerated on hd ESRD: on HD TTS. HD today, next HD either tomorrow or Friday depending on staffing constraints and inpatient census History of biventricular congestive heart failure: Not a candidate for LVAD or transplant, per notes. Hypotension: Stable on midodrine '20mg'$  TID. Atrial fibrillation: On amiodarone + Eliquis. CKD MBD: COrrCa high, Phos were high -> increased Fosrenol binders to '1000mg'$  TID. PTH was 42. Anemia of chronic kidney disease: Hgb 9- 11 -  continue Aranesp 1105mg weekly.  Hyponatremia: managing with HD, 137Na bath, Uf'ing as tolerated  VGean Quint MD [facility]10/06/2021, 8:08 AM

## 2021-05-06 NOTE — TOC CM/SW Note (Signed)
HF TOC CM spoke to pt and provided his Longterm disability paperwork for him to sign from [facility]. Will have HF RN, [name] complete and fax. Message sent to attending for possible LTAC for rehab and IV abx. He lives at home with his mother and she is unable to care for him at home. [place], Heart Failure TOC CM [phone]

## 2021-05-07 DIAGNOSIS — B9561 Methicillin susceptible Staphylococcus aureus infection as the cause of diseases classified elsewhere: Secondary | ICD-10-CM | POA: Diagnosis not present

## 2021-05-07 DIAGNOSIS — R7881 Bacteremia: Secondary | ICD-10-CM | POA: Diagnosis not present

## 2021-05-07 MED ORDER — OXYCODONE HCL ER 10 MG PO T12A
10.0000 mg | EXTENDED_RELEASE_TABLET | Freq: Two times a day (BID) | ORAL | Status: DC
Start: 2021-05-07 — End: 2021-05-20
  Administered 2021-05-07 – 2021-05-19 (×22): 10 mg via ORAL
  Filled 2021-05-07 (×22): qty 1

## 2021-05-07 MED ORDER — OXYCODONE HCL 5 MG PO TABS
10.0000 mg | ORAL_TABLET | Freq: Four times a day (QID) | ORAL | Status: DC | PRN
  Administered 2021-05-07 – 2021-05-15 (×13): 10 mg via ORAL
  Filled 2021-05-07 (×13): qty 2

## 2021-05-07 NOTE — Progress Notes (Signed)
Dialysis transporters were here to pick up patient at 1600. Patient refused today, saying he was not feeling well.

## 2021-05-07 NOTE — Progress Notes (Signed)
Occupational Therapy Treatment Patient Details Name: Phillip Young MRN: [id number] DOB: 11-22-1985 Today's Date: 05/07/2021   History of present illness Pt is a 35 y.o. male admitted 04/12/21 with SOB, hypoxia. Workup for MSSA bacteremia, acute hypoxic respiratory failure, cardiorenal syndrome. Pt had catheter holiday with clearance of bacteremia; pt declined reinsertion of HD catheter 9/27. Course complicated by R knee and L hip pain; pelvic CT negative for acute abnormality. Of note, recent admission 02/11/21-03/26/21 with cardiogenic shock, biventricular HF, progression to ESRD with need for HD. Other PMH includes CHF, DM2, aflutter, ESRD (HD TTS).   OT comments  Patient received in bed and eager to get out of bed and perform standing. Patient has improved with bed mobility and sit to stands. Patient asked to use BSC but remained standing until he returned to EOB.  Patient continues to require extra assistance for returning to supine.  Acute OT to continue to follow.    Recommendations for follow up therapy are one component of a multi-disciplinary discharge planning process, led by the attending physician.  Recommendations may be updated based on patient status, additional functional criteria and insurance authorization.    Follow Up Recommendations  SNF    Equipment Recommendations  3 in 1 bedside commode    Recommendations for Other Services      Precautions / Restrictions Precautions Precautions: Fall Precaution Comments: Watch SpO2 (wears 1L O2 baseline) Restrictions Weight Bearing Restrictions: No       Mobility Bed Mobility Overal bed mobility: Needs Assistance Bed Mobility: Rolling Rolling: Min assist Sidelying to sit: Mod assist;HOB elevated   Sit to supine: Max assist;+2 for physical assistance   General bed mobility comments: pt able to roll with use of bed rail, move LE to EOB with use of foot board, then needing modA and use of bed rail to pull to sitting. increased  time for all movement. maxA to bring LE back into bed and to reposition in bed    Transfers Overall transfer level: Needs assistance Equipment used: 4-wheeled walker Transfers: Sit to/from [facility] Sit to Stand: Mod assist;+2 safety/equipment Stand pivot transfers: Min assist;+2 safety/equipment       General transfer comment: pt powering up slowly, likely needed less assist than given as pt reports he has gotten himself from [facility] back to bed without assist.    Balance Overall balance assessment: Needs assistance Sitting-balance support: Feet supported Sitting balance-Leahy Scale: Good Sitting balance - Comments: able to maintain sitting EOB   Standing balance support: Bilateral upper extremity supported Standing balance-Leahy Scale: Poor Standing balance comment: prefers BUE support for pain control                           ADL either performed or assessed with clinical judgement   ADL                                         General ADL Comments: performed standing to increase independence with functional transfers and standing for ADLs     Vision       Perception     Praxis      Cognition Arousal/Alertness: Awake/alert Behavior During Therapy: Restless;Anxious Overall Cognitive Status: Within Functional Limits for tasks assessed  General Comments: pt remains demanding at times and doing what he can to maintain control of the session. repeatedly stating he needs to regain strength but also states multiple reasons why further strengthening will need to occur at later sessions.        Exercises     Shoulder Instructions       General Comments VSS on 2L. pt on 5L upon arrival and asking therapist to return to 6L, but SpO2 95-99% on 2L. Hr in 90s    Pertinent Vitals/ Pain       Pain Assessment: Faces Faces Pain Scale: Hurts little more Pain Location: left hip, right  knee Pain Descriptors / Indicators: Grimacing;Moaning;Guarding;Constant;Discomfort;Sore Pain Intervention(s): Limited activity within patient's tolerance;Monitored during session;Repositioned;Premedicated before session  Home Living Family/patient expects to be discharged to:: Private residence                                        Prior Functioning/Environment              Frequency  Min 2X/week        Progress Toward Goals  OT Goals(current goals can now be found in the care plan section)  Progress towards OT goals: Progressing toward goals  Acute Rehab OT Goals Patient Stated Goal: to stand up OT Goal Formulation: With patient Time For Goal Achievement: 05/09/21 Potential to Achieve Goals: Good ADL Goals Pt Will Perform Grooming: with set-up;standing Pt Will Perform Lower Body Bathing: with min guard assist;sit to/from stand Pt Will Transfer to Toilet: with min guard assist;stand pivot transfer;bedside commode Pt/caregiver will Perform Home Exercise Program: Increased ROM;Left upper extremity;Independently;With written HEP provided  Plan Discharge plan remains appropriate    Co-evaluation    PT/OT/SLP Co-Evaluation/Treatment: Yes Reason for Co-Treatment: Necessary to address cognition/behavior during functional activity;For patient/therapist safety PT goals addressed during session: Mobility/safety with mobility;Strengthening/ROM OT goals addressed during session: Strengthening/ROM      AM-PAC OT "6 Clicks" Daily Activity     Outcome Measure   Help from another person eating meals?: None Help from another person taking care of personal grooming?: A Little Help from another person toileting, which includes using toliet, bedpan, or urinal?: A Lot Help from another person bathing (including washing, rinsing, drying)?: A Lot Help from another person to put on and taking off regular upper body clothing?: A Little Help from another person to put  on and taking off regular lower body clothing?: A Lot 6 Click Score: 16    End of Session Equipment Utilized During Treatment: Gait belt;Rolling walker;Oxygen  OT Visit Diagnosis: Unsteadiness on feet (R26.81);Other abnormalities of gait and mobility (R26.89);Muscle weakness (generalized) (M62.81);Pain Pain - Right/Left: Left Pain - part of body: Hip   Activity Tolerance Patient limited by pain   Patient Left in bed;with call bell/phone within reach   Nurse Communication Mobility status        Time: [id number] OT Time Calculation (min): 57 min  Charges: OT General Charges $OT Visit: 1 Visit OT Treatments $Therapeutic Activity: 23-37 mins  Lodema Hong, OTA   Marcee Jacobs Alexis Goodell 05/07/2021, 11:55 AM

## 2021-05-07 NOTE — Progress Notes (Signed)
Pt refused CPAP tonight,

## 2021-05-07 NOTE — Plan of Care (Signed)
  Problem: Education: Goal: Knowledge of General Education information will improve Description: Including pain rating scale, medication(s)/side effects and non-pharmacologic comfort measures Outcome: Progressing   Problem: Health Behavior/Discharge Planning: Goal: Ability to manage health-related needs will improve Outcome: Progressing   Problem: Clinical Measurements: Goal: Ability to maintain clinical measurements within normal limits will improve Outcome: Progressing Goal: Will remain free from infection Outcome: Progressing   Problem: Activity: Goal: Risk for activity intolerance will decrease Outcome: Progressing   Problem: Coping: Goal: Level of anxiety will decrease Outcome: Progressing   Problem: Pain Managment: Goal: General experience of comfort will improve Outcome: Progressing   Problem: Skin Integrity: Goal: Risk for impaired skin integrity will decrease Outcome: Progressing

## 2021-05-07 NOTE — Progress Notes (Signed)
Physical Therapy Treatment Patient Details Name: Phillip Young MRN: [id number] DOB: [birth year] Today's Date: 05/07/2021   History of Present Illness Pt is a 35 y.o. male admitted 04/12/21 with SOB, hypoxia. Workup for MSSA bacteremia, acute hypoxic respiratory failure, cardiorenal syndrome. Pt had catheter holiday with clearance of bacteremia; pt declined reinsertion of HD catheter 9/27. Course complicated by R knee and L hip pain; pelvic CT negative for acute abnormality. Of note, recent admission 02/11/21-03/26/21 with cardiogenic shock, biventricular HF, progression to ESRD with need for HD. Other PMH includes CHF, DM2, aflutter, ESRD (HD TTS).    PT Comments    The pt was motivated to progress mobility at this time, but remains limited by pain and fatigue due to decreased mobility. The pt was able to complete bed mobility with reduced physical assist, but continues to require modA of 2 to power up to standing and to manage short bouts of stepping in the room due to LE weakness and pain.   Due to continued deficits in power, endurance, and gait, as well as difficulties being placed in SNF, I have updated the frequency to attempt to further progress pt mobility to allow for eventual d/c home.   Pt stable through session with all mobility on 2LO2.  HR 90-110   Recommendations for follow up therapy are one component of a multi-disciplinary discharge planning process, led by the attending physician.  Recommendations may be updated based on patient status, additional functional criteria and insurance authorization.  Follow Up Recommendations  SNF (pt remains difficult to place, will work towards home with HHPT but is not yet safe or able to d/c home)     Equipment Recommendations  None recommended by PT (has needed DME)    Recommendations for Other Services       Precautions / Restrictions Precautions Precautions: Fall Precaution Comments: Watch SpO2 (wears 1L O2  baseline) Restrictions Weight Bearing Restrictions: No     Mobility  Bed Mobility Overal bed mobility: Needs Assistance Bed Mobility: Rolling Rolling: Min assist Sidelying to sit: Mod assist;HOB elevated   Sit to supine: Max assist;+2 for physical assistance   General bed mobility comments: pt able to roll with use of bed rail, move LE to EOB with use of foot board, then needing modA and use of bed rail to pull to sitting. increased time for all movement. maxA to bring LE back into bed and to reposition in bed    Transfers Overall transfer level: Needs assistance Equipment used: 4-wheeled walker Transfers: Sit to/from [facility] Sit to Stand: Mod assist;+2 safety/equipment Stand pivot transfers: Min assist;+2 safety/equipment       General transfer comment: pt powering up slowly, likely needed less assist than given as pt reports he has gotten himself from [facility] back to bed without assist.  Ambulation/Gait Ambulation/Gait assistance: Min assist;+2 safety/equipment Gait Distance (Feet): 8 Feet Assistive device: 4-wheeled walker;Bilateral platform walker (EVA) Gait Pattern/deviations: Step-to pattern;Decreased weight shift to left;Decreased stance time - left;Decreased step length - right;Shuffle Gait velocity: reduced Gait velocity interpretation: <1.31 ft/sec, indicative of household ambulator General Gait Details: decreased wt acceptance on RLE due to pain. minA of 2 to manage eva walker. pt needing cues for posture but states trunk flexion is for pain management. declined further ambulation due to pain, but remained standing for ~20 min      Balance Overall balance assessment: Needs assistance Sitting-balance support: Feet supported Sitting balance-Leahy Scale: Good Sitting balance - Comments: able to maintain sitting EOB  Standing balance support: Bilateral upper extremity supported Standing balance-Leahy Scale: Poor Standing balance comment: prefers  BUE support for pain control                            Cognition Arousal/Alertness: Awake/alert Behavior During Therapy: Restless;Anxious Overall Cognitive Status: Within Functional Limits for tasks assessed                                 General Comments: pt remains demanding at times and doing what he can to maintain control of the session. repeatedly stating he needs to regain strength but also states multiple reasons why further strengthening will need to occur at later sessions.      Exercises      General Comments General comments (skin integrity, edema, etc.): VSS on 2L. pt on 5L upon arrival and asking therapist to return to 6L, but SpO2 95-99% on 2L. Hr in 90s      Pertinent Vitals/Pain Pain Assessment: Faces Faces Pain Scale: Hurts little more Pain Location: left hip, right knee Pain Descriptors / Indicators: Grimacing;Moaning;Guarding;Constant;Discomfort;Sore Pain Intervention(s): Limited activity within patient's tolerance;Monitored during session;Repositioned;Premedicated before session     PT Goals (current goals can now be found in the care plan section) Acute Rehab PT Goals Patient Stated Goal: to stand up PT Goal Formulation: With patient Time For Goal Achievement: 05/13/21 Potential to Achieve Goals: Fair Progress towards PT goals: Progressing toward goals    Frequency    Min 3X/week      PT Plan Current plan remains appropriate    Co-evaluation PT/OT/SLP Co-Evaluation/Treatment: Yes Reason for Co-Treatment: Necessary to address cognition/behavior during functional activity;For patient/therapist safety PT goals addressed during session: Mobility/safety with mobility;Strengthening/ROM        AM-PAC PT "6 Clicks" Mobility   Outcome Measure  Help needed turning from your back to your side while in a flat bed without using bedrails?: A Little Help needed moving from lying on your back to sitting on the side of a flat  bed without using bedrails?: A Lot Help needed moving to and from a bed to a chair (including a wheelchair)?: A Lot Help needed standing up from a chair using your arms (e.g., wheelchair or bedside chair)?: A Lot Help needed to walk in hospital room?: A Lot Help needed climbing 3-5 steps with a railing? : Total 6 Click Score: 12    End of Session Equipment Utilized During Treatment: Oxygen Activity Tolerance: Patient limited by pain Patient left: in bed;with nursing/sitter in room Nurse Communication: Mobility status PT Visit Diagnosis: Difficulty in walking, not elsewhere classified (R26.2);Other abnormalities of gait and mobility (R26.89);Pain Pain - Right/Left: Left Pain - part of body: Hip;Knee     Time: [id number] PT Time Calculation (min) (ACUTE ONLY): 57 min  Charges:  $Gait Training: 8-22 mins $Therapeutic Activity: 8-22 mins                     [place] Car[place], PT, DPT   Acute Rehabilitation Department Pager #: [phone]   Sandra Cockayne 05/07/2021, 11:25 AM

## 2021-05-07 NOTE — Progress Notes (Signed)
[facility] Progress Note   Subjective:  Seen in room. Tolerated HD yesterday without incident, net uf 3L. Had some nausea overnight. He does not want to do HD now, may consider later today and is okay for HD tomorrow  Objective Vitals:   05/06/21 1200 05/06/21 1209 05/06/21 2300 05/07/21 0500  BP: 124/69 (!) 119/58 113/78 120/84  Pulse: 79 78 86 85  Resp:  (!) '22 17 16  '$ Temp:  98.1 F (36.7 C) 98 F (36.7 C) 97.9 F (36.6 C)  TempSrc:  Oral Oral Oral  SpO2:  100% 95% 97%  Height:       Physical Exam General: Obese, nad Heart: RRR; no murmur Lungs: CTA anteriorly Abdomen: obese, soft, non-tender Extremities: trace LE edema Neuro: awake, alert Dialysis Access:  TDC in R chest  Additional Objective Labs: Basic Metabolic Panel: Recent Labs  Lab 05/02/21 1251 05/02/21 1944 05/03/21 0041 05/04/21 0011 05/06/21 0901  NA 127*   < > 129* 130* 133*  K 4.3   < > 4.0 4.2 4.5  CL 91*   < > 94* 95* 96*  CO2 27   < > '25 26 26  '$ GLUCOSE 91   < > 123* 105* 100*  BUN 38*   < > 22* 31* 37*  CREATININE 4.23*   < > 3.65* 4.35* 3.74*  CALCIUM 9.8   < > 9.7 9.8 10.0  PHOS 5.3*  --   --   --  6.4*   < > = values in this interval not displayed.   Liver Function Tests: Recent Labs  Lab 05/02/21 1251 05/06/21 0901  ALBUMIN 2.7* 2.8*   CBC: Recent Labs  Lab 05/02/21 1251 05/04/21 0011 05/06/21 0901  WBC 7.4 8.5 7.4  HGB 10.0* 10.6* 10.1*  HCT 32.6* 33.5* 33.4*  MCV 97.0 96.5 98.5  PLT 461* 406* 399   Studies/Results: No results found.  Medications:  anticoagulant sodium citrate     anticoagulant sodium citrate      ceFAZolin (ANCEF) IV 1 g (05/06/21 1617)    allopurinol  100 mg Oral Daily   amiodarone  200 mg Oral Daily   apixaban  5 mg Oral BID   bisacodyl  10 mg Rectal Once   Chlorhexidine Gluconate Cloth  6 each Topical Daily   darbepoetin (ARANESP) injection - DIALYSIS  150 mcg Intravenous Q Thu-HD   docusate sodium  100 mg Oral BID    lanthanum  1,000 mg Oral TID WC   linaclotide  145 mcg Oral QAC breakfast   midodrine  20 mg Oral TID WC   polyethylene glycol  17 g Oral BID    Dialysis Orders: TTS/ AKI [facility] 4h  400/800    188.5kg   2/2 bath  TDC RIJ  Hep none  - no in center meds  Assessment/Plan: MSSA bacteremia: Blood Cx 9/22 + and catheter exit site purulence. S/p [facility] removal/line holiday 9/24 -> 9/28 new TDC placed. Has assoc left-sided SI joint septic athritis w/ local osteo and 2 associated small abscesses. Per ortho too high risk for surgical Rx. Per IR abscesses are small, would need IV contrast CT to attempt drain and even with that they may not be amenable to drainage; so drainage is not being pursued. On IV Cefazolin x 6 week course (thru 11/5). S/p TEE 10/11-neg for endocarditis. Per primary service Volume: improved, uf as tolerated on hd ESRD: on HD TTS. HD either today or tomorrow off sched. Discussed w/ HD  staff History of biventricular congestive heart failure: Not a candidate for LVAD or transplant, per notes. Hypotension: Stable on midodrine '20mg'$  TID. Atrial fibrillation: On amiodarone + Eliquis. CKD MBD: COrrCa high, Phos were high -> increased Fosrenol binders to '1000mg'$  TID. PTH was 42. Anemia of chronic kidney disease: Hgb 9- 11 - continue Aranesp 133mg weekly.  Hyponatremia: managing with HD, 137Na bath, Uf'ing as tolerated  VGean Quint MD CPhysicians Surgery Center Of Nevada, LLC10/13/2022, 8:46 AM

## 2021-05-07 NOTE — Progress Notes (Signed)
PROGRESS NOTE    Phillip Young  [id number] DOB: 1986/02/01 DOA: 04/12/2021 PCP: [name], MD   Brief Narrative: 35 year old with past medical history significant for severe biventricular systolic heart failure with known ejection fraction less than 20%, diabetes type 2, paroxysmal a flutter on Eliquis, recently started on hemodialysis with recent extensive hospitalization with cardiogenic shock and cardiorenal syndrome progressed to ESRD presented back to the emergency room with shortness of breath, left wrist and right knee pain, unable to ambulate.  Home oxygen concentrator not working.  Chest x-ray showed pulmonary edema, COVID-19 negative.  Patient was found to have MSSA bacteremia due to infected dialysis catheter.  ID and nephrology consulted.  After catheter holiday he received new permanent cath on 9/28 and restarted on hemodialysis. Patient has been declining treatments for the last week on and off.  He now has agreed to dialysis and MRI to evaluate hip pain.   Assessment & Plan:   Principal Problem:   MSSA bacteremia Active Problems:   Morbid obesity ([place])   GERD without esophagitis   OSA (obstructive sleep apnea)   Chronic systolic CHF (congestive heart failure) ([name])   Paroxysmal atrial flutter ([name])   Hyponatremia   ESRD (end stage renal disease) on dialysis ([place])   Acute on chronic respiratory failure with hypoxia ([name])   Medical non-compliance   Hypotension   ESRD on dialysis [facility])   Palliative care by specialist   Full code status   Concern about end of life   1-MSSA Bacteremia; Arthralgia;    Left SI septic arthritis, sacrum and iliac bone osteomyelitis elongated abscess involving the left iliacus muscle, small abscess left piriformis muscle/MSSA bacteremia: CT left Hip 9/25 destructive changes. CT scan right knee no acute finding. Blood culture 9/22. Positive for MSSA.  Repeated Blood cultures from 04/19/2021 NTD.ECHO; no evidence of vegetation.   Perm cath removed. He was extremely reluctant but then allowed and eventually underwent TEE 10/11 which was negative for endocarditis.  ID consulted, recommend IV antibiotics for 6 weeks. Until Nov 6th.  On pain medication: dilaudid and Oxy but pain not controlled.  Will discontinue Dilaudid.  Start on long-acting oxycodone 10 mg every 12 hours with as needed short acting oxycodone 10 mg every 6 hours.  -Ortho consulted. Patient is too high risk for surgery. Per IR, abscess is small and even with CT scan, it is in a location that may not be amenable for drainage, for this reason and to avoid to give more contrast to patient to prevent worsening to any residual  renal function he has left wont proceed with CT scan or procedure. Per ortho there is not significant therapeutic improvement for mobility with drainage of abscess. plan to treat with IV antibiotics. He will need Imagine after completion of antibiotics.  Assessed by PT OT, needs SNF.  TOC on board.  Acute on chronic respiratory failure with hypoxemia and hypercapnia: Due to malfunctioning concentrator at home now with fluid overload Volume managed with hemodialysis. Nephrology following.  Currently on baseline oxygen requirement.  ESRD on hemodialysis: Had a new PermCath He has been declining dialysis on and off.  Was evaluated by palliative care, patient agreed to have hemodialysis and further treatments.   Biventricular heart failure, paroxysmal A. fib, hypotension: Ejection fraction less than 20%.  Followed by heart failure team.  Intolerance to advanced heart failure therapies.  On midodrine 20 3 times daily Volume managed with hemodialysis. On amiodarone 200 mg p.o. daily for paroxysmal A. fib.  Morbid  obesity: BMI more than 50. On Semaglutide.   Hyponatremia; volume manage with HD.   Constipation; had BM   Stage I decubitus ulcer at the medial left gluteal fold: Primary RN informed to place some dressing.  Estimated body  mass index is 67.64 kg/m as calculated from the following:   Height as of this encounter: '5\' 11"'$  (1.803 m).   Weight as of this encounter: 220 kg.   DVT prophylaxis: Eliquis Code Status: Full Code Family Communication: None present. Disposition Plan:  Status is: Inpatient  Remains inpatient appropriate because: Unsafe discharge plan  Dispo: The patient is from: Home              Anticipated d/c is to: SNF              Patient currently is  medically stable to d/c.    Difficult to place patient yes   Consultants:  ID Nephrology  Heart failure team  Procedures:  HD  Antimicrobials:  Cefazolin   Subjective: Patient seen and examined.  Complains of some pain at the left lateral side which is sometimes controlled but hurts more when he works with the physical therapy.  No other complaint.   Objective: Vitals:   05/06/21 1200 05/06/21 1209 05/06/21 2300 05/07/21 0500  BP: 124/69 (!) 119/58 113/78 120/84  Pulse: 79 78 86 85  Resp:  (!) '22 17 16  '$ Temp:  98.1 F (36.7 C) 98 F (36.7 C) 97.9 F (36.6 C)  TempSrc:  Oral Oral Oral  SpO2:  100% 95% 97%  Height:        Intake/Output Summary (Last 24 hours) at 05/07/2021 1116 Last data filed at 05/07/2021 [id number] Gross per 24 hour  Intake 1180 ml  Output 3000 ml  Net -1820 ml    Filed Weights    Examination:  General exam: Appears calm and comfortable, morbidly obese Respiratory system: Clear to auscultation. Respiratory effort normal. Cardiovascular system: S1 & S2 heard, RRR. No JVD, murmurs, rubs, gallops or clicks. No pedal edema. Gastrointestinal system: Abdomen is nondistended, soft and nontender. No organomegaly or masses felt. Normal bowel sounds heard. Central nervous system: Alert and oriented. No focal neurological deficits. Extremities: Symmetric 5 x 5 power. Skin: Very small perhaps 2 x 2 centimeter round stage I decubitus ulcer at the medial side of the left gluteal fold on the lower side. Psychiatry:  Judgement and insight appear normal. Mood & affect appropriate.    Data Reviewed: I have personally reviewed following labs and imaging studies  CBC: Recent Labs  Lab 05/02/21 1251 05/04/21 0011 05/06/21 0901  WBC 7.4 8.5 7.4  HGB 10.0* 10.6* 10.1*  HCT 32.6* 33.5* 33.4*  MCV 97.0 96.5 98.5  PLT 461* 406* [id number]    Basic Metabolic Panel: Recent Labs  Lab 05/02/21 1251 05/02/21 1944 05/03/21 0041 05/04/21 0011 05/06/21 0901  NA 127* 130* 129* 130* 133*  K 4.3 4.2 4.0 4.2 4.5  CL 91* 93* 94* 95* 96*  CO2 '27 25 25 26 26  '$ GLUCOSE 91 109* 123* 105* 100*  BUN 38* 21* 22* 31* 37*  CREATININE 4.23* 3.16* 3.65* 4.35* 3.74*  CALCIUM 9.8 9.5 9.7 9.8 10.0  PHOS 5.3*  --   --   --  6.4*    GFR: Estimated Creatinine Clearance: 51.4 mL/min (A) (by C-G formula based on SCr of 3.74 mg/dL (H)). Liver Function Tests: Recent Labs  Lab 05/02/21 1251 05/06/21 0901  ALBUMIN 2.7* 2.8*    No results for  input(s): LIPASE, AMYLASE in the last 168 hours. No results for input(s): AMMONIA in the last 168 hours. Coagulation Profile: No results for input(s): INR, PROTIME in the last 168 hours.  Cardiac Enzymes: No results for input(s): CKTOTAL, CKMB, CKMBINDEX, TROPONINI in the last 168 hours. BNP (last 3 results) No results for input(s): PROBNP in the last 8760 hours. HbA1C: No results for input(s): HGBA1C in the last 72 hours. CBG: No results for input(s): GLUCAP in the last 168 hours. Lipid Profile: No results for input(s): CHOL, HDL, LDLCALC, TRIG, CHOLHDL, LDLDIRECT in the last 72 hours. Thyroid Function Tests: No results for input(s): TSH, T4TOTAL, FREET4, T3FREE, THYROIDAB in the last 72 hours. Anemia Panel: No results for input(s): VITAMINB12, FOLATE, FERRITIN, TIBC, IRON, RETICCTPCT in the last 72 hours. Sepsis Labs: No results for input(s): PROCALCITON, LATICACIDVEN in the last 168 hours.  No results found for this or any previous visit (from the past 240 hour(s)).    Radiology Studies: No results found.   Scheduled Meds:  allopurinol  100 mg Oral Daily   amiodarone  200 mg Oral Daily   apixaban  5 mg Oral BID   bisacodyl  10 mg Rectal Once   Chlorhexidine Gluconate Cloth  6 each Topical Daily   darbepoetin (ARANESP) injection - DIALYSIS  150 mcg Intravenous Q Thu-HD   docusate sodium  100 mg Oral BID   lanthanum  1,000 mg Oral TID WC   linaclotide  145 mcg Oral QAC breakfast   midodrine  20 mg Oral TID WC   oxyCODONE  10 mg Oral Q12H   polyethylene glycol  17 g Oral BID   Continuous Infusions:  anticoagulant sodium citrate     anticoagulant sodium citrate      ceFAZolin (ANCEF) IV 1 g (05/06/21 1617)     LOS: 21 days   Time spent: 30 minutes.   Darliss Cheney, MD [facility]  If 7PM-7AM, please contact night-coverage [email or web address]  05/07/2021, 11:16 AM

## 2021-05-07 NOTE — TOC Progression Note (Signed)
Transition of Care [facility]) - Progression Note    Patient Details  Name: Phillip Young MRN: [id number] Date of Birth: 05/18/1986  [facility] CM/SW Contact  Reece Agar, [place] Phone Number: 05/07/2021, 4:34 PM  Clinical Narrative:    [phone] Caren Griffins with [employer] contacted CSW to offer assistance for pt care and placement. CSW will follow up with Caren Griffins for any insurance needs.   Expected Discharge Plan: [place] Barriers to Discharge: Continued Medical Work up  Expected Discharge Plan and Services Expected Discharge Plan: [place] In-house Referral: Clinical Social Work Discharge Planning Services: CM Consult Post Acute Care Choice: [place] arrangements for the past 2 months: [place]                   DME Agency: [employer] Date DME Agency Contacted: 04/14/21 Time DME Agency Contacted: [phone] Representative spoke with at DME Agency: Andree Coss             Social Determinants of Health (SDOH) Interventions Food Insecurity Interventions: Assist with SNAP Application Financial Strain Interventions: Other (Comment) (Referral to [facility]) Housing Interventions: Intervention Not Indicated Transportation Interventions: Intervention Not Indicated  Readmission Risk Interventions Readmission Risk Prevention Plan 10/16/2020 10/16/2020 10/14/2020  Transportation Screening Complete Complete Complete  PCP or Specialist Appt within 5-7 Days Complete Complete Complete  Home Care Screening Complete - -  Medication Review (RN CM) Referral to Pharmacy - -  Some recent data might be hidden

## 2021-05-07 NOTE — Progress Notes (Addendum)
Initial Nutrition Assessment  DOCUMENTATION CODES:   Morbid obesity  INTERVENTION:   - Double protein portions TID with meals  - Renal MVI daily  NUTRITION DIAGNOSIS:   Increased nutrient needs related to chronic illness (ERSD on HD) as evidenced by estimated needs.  GOAL:   Patient will meet greater than or equal to 90% of their needs  MONITOR:   PO intake, Labs, Weight trends, I & O's, Skin  REASON FOR ASSESSMENT:   Consult Assessment of nutrition requirement/status  ASSESSMENT:   35 year old male who presented to the ED on 9/18 with SOB. PMH of ESRD on HD, CHF with EF <20%, T2DM. Pt with recent hospital admission from 02/11/21 to 03/26/21 when he required CRRT. Pt admitted with acute on chronic respiratory failure.  09/24 - TDC removal by IR 09/28 - s/p tunneled HD catheter placement 10/05 - MRI showing sacral/iliac bone osteomyelitis/septic arthritis and 2 small abscesses 10/11 - s/p TEE with no evidence of endocarditis  During admission, pt found to have complicated MSSA bacteremia. Pt also with sacral/iliac bone osteomyelitis/septic arthritis and 2 small abscesses. [name] most recent HD treatment was this morning with 3000 ml net UF. No post-HD weight documented.  Pt sleeping at time of RD visit. Unable to obtain diet and weight history at this time. Spoke with RN who reports pt eating well although he has not yet had lunch today due to just getting back from HD.  Reviewed RD notes from previous admission. Pt had difficulty adhering to fluid restriction. Pt benefited from double protein portions with meals. RD to order double protein as well as renal MVI. Pt on fosrenol with meals but intermittently refusing per MAR.  EDW: 188.5 kg Admit weight: 189.4 kg Current weight: 210 kg (prior to HD today) Lowest weight since admit: 183.6 kg  Pt with non-pitting edema to BUE and BLE. Pt currently well above EDW.  Meal Completion: 10-100%  Medications reviewed and include:  aranesp weekly, colace, fosrenol TID with meals, linzess, miralax, IV abx  Labs reviewed: sodium 130, phosphorus 7.5  UOP: 400 ml x 24 hours I/O's: -16.8 L since admit  NUTRITION - FOCUSED PHYSICAL EXAM:  Unable to complete at this time.  Diet Order:   Diet Order             Diet renal/carb modified with fluid restriction Diet-HS Snack? Nothing; Fluid restriction: 1200 mL Fluid; Room service appropriate? Yes; Fluid consistency: Thin  Diet effective now                   EDUCATION NEEDS:   Not appropriate for education at this time  Skin:  Skin Assessment: Reviewed RN Assessment (stage I to gluteal fold per MD note)  Last BM:  05/05/21  Height:   Ht Readings from Last 1 Encounters:  04/12/21 '5\' 11"'$  (1.803 m)    Weight:   Wt Readings from Last 1 Encounters:  05/08/21 (!) 210 kg    BMI:  Body mass index is 64.58 kg/m.  Estimated Nutritional Needs:   Kcal:  2300-2500  Protein:  150-170 grams  Fluid:  1000 ml + UOP    Gustavus Bryant, MS, RD, LDN Inpatient Clinical Dietitian Please see AMiON for contact information.

## 2021-05-08 DIAGNOSIS — B9561 Methicillin susceptible Staphylococcus aureus infection as the cause of diseases classified elsewhere: Secondary | ICD-10-CM | POA: Diagnosis not present

## 2021-05-08 DIAGNOSIS — R7881 Bacteremia: Secondary | ICD-10-CM | POA: Diagnosis not present

## 2021-05-08 LAB — RENAL FUNCTION PANEL
Albumin: 3 g/dL — ABNORMAL LOW (ref 3.5–5.0)
Anion gap: 12 (ref 5–15)
BUN: 34 mg/dL — ABNORMAL HIGH (ref 6–20)
CO2: 24 mmol/L (ref 22–32)
Calcium: 10.4 mg/dL — ABNORMAL HIGH (ref 8.9–10.3)
Chloride: 94 mmol/L — ABNORMAL LOW (ref 98–111)
Creatinine, Ser: 5.12 mg/dL — ABNORMAL HIGH (ref 0.61–1.24)
GFR, Estimated: 14 mL/min — ABNORMAL LOW (ref >60)
Glucose, Bld: 87 mg/dL (ref 70–99)
Phosphorus: 7.5 mg/dL — ABNORMAL HIGH (ref 2.5–4.6)
Potassium: 5 mmol/L (ref 3.5–5.1)
Sodium: 130 mmol/L — ABNORMAL LOW (ref 135–145)

## 2021-05-08 LAB — CBC
HCT: 34.7 % — ABNORMAL LOW (ref 39.0–52.0)
Hemoglobin: 11 g/dL — ABNORMAL LOW (ref 13.0–17.0)
MCH: 30.2 pg (ref 26.0–34.0)
MCHC: 31.7 g/dL (ref 30.0–36.0)
MCV: 95.3 fL (ref 80.0–100.0)
Platelets: 351 10*3/uL (ref 150–400)
RBC: 3.64 MIL/uL — ABNORMAL LOW (ref 4.22–5.81)
RDW: 17 % — ABNORMAL HIGH (ref 11.5–15.5)
WBC: 6.6 10*3/uL (ref 4.0–10.5)
nRBC: 0 % (ref 0.0–0.2)

## 2021-05-08 LAB — HEPATITIS B SURFACE ANTIGEN: Hepatitis B Surface Ag: NONREACTIVE

## 2021-05-08 LAB — HEPATITIS B SURFACE ANTIBODY,QUALITATIVE: Hep B S Ab: REACTIVE — AB

## 2021-05-08 MED ORDER — RENA-VITE PO TABS
1.0000 | ORAL_TABLET | Freq: Every day | ORAL | Status: DC
  Administered 2021-05-08 – 2021-05-18 (×11): 1 via ORAL
  Filled 2021-05-08 (×11): qty 1

## 2021-05-08 MED ORDER — DARBEPOETIN ALFA 150 MCG/0.3ML IJ SOSY
150.0000 ug | PREFILLED_SYRINGE | INTRAMUSCULAR | Status: DC
  Administered 2021-05-08: 150 ug via INTRAVENOUS

## 2021-05-08 NOTE — Progress Notes (Signed)
[facility] Progress Note   Dialysis Orders: TTS/ AKI [facility] 4h  400/800    188.5kg   2/2 bath  TDC RIJ  Hep none  - no in center meds  Assessment/Plan: MSSA bacteremia: Blood Cx 9/22 + and catheter exit site purulence. S/p [facility] removal/line holiday 9/24 -> 9/28 new TDC placed. Has assoc left-sided SI joint septic athritis w/ local osteo and 2 associated small abscesses. Per ortho too high risk for surgical Rx. Per IR abscesses are small, would need IV contrast CT to attempt drain and even with that they may not be amenable to drainage; so drainage is not being pursued. On IV Cefazolin x 6 week course (thru 11/5). S/p TEE 10/11-neg for endocarditis. Per primary service Volume: improved, uf as tolerated on hd ESRD: on HD TTS.  10/6 2.8L, 10/8 1.9L, 10/12 net UF 3L  Seen on HD through RIJ TC 2K goal net UF 2.5L  114/86  Plan on HD Sat as well.  History of biventricular congestive heart failure: Not a candidate for LVAD or transplant, per notes. Hypotension: Stable on midodrine '20mg'$  TID. Atrial fibrillation: On amiodarone + Eliquis. CKD MBD: COrrCa high, Phos were high -> increased Fosrenol binders to '1000mg'$  TID. PTH was 42. P7.5 10/14 (recheck on mon or tues) Anemia of chronic kidney disease: Hgb 9- 11 - continue Aranesp 172mg weekly.  Hyponatremia: managing with HD, 137Na bath, Uf'ing as tolerated   Subjective:  Seen in HD;  some nausea but no vomiting. Denies f/c/myalgias. Standing with assistance.  Objective Vitals:   05/08/21 0729 05/08/21 0742 05/08/21 0800 05/08/21 0830  BP: 111/70 112/74 (!) 98/59 (!) 98/52  Pulse:  75 75 77  Resp: '18 20 15   '$ Temp: 97.8 F (36.6 C)     TempSrc: Oral     SpO2: 100%     Weight: (!) 210 kg     Height:       Physical Exam General: Obese, nad Heart: RRR; no murmur Lungs: CTA anteriorly Abdomen: obese, soft, non-tender Extremities: trace LE edema Neuro: awake, alert Dialysis Access:  TDC in R  chest  Additional Objective Labs: Basic Metabolic Panel: Recent Labs  Lab 05/02/21 1251 05/02/21 1944 05/04/21 0011 05/06/21 0901 05/08/21 0643  NA 127*   < > 130* 133* 130*  K 4.3   < > 4.2 4.5 5.0  CL 91*   < > 95* 96* 94*  CO2 27   < > '26 26 24  '$ GLUCOSE 91   < > 105* 100* 87  BUN 38*   < > 31* 37* 34*  CREATININE 4.23*   < > 4.35* 3.74* 5.12*  CALCIUM 9.8   < > 9.8 10.0 10.4*  PHOS 5.3*  --   --  6.4* 7.5*   < > = values in this interval not displayed.   Liver Function Tests: Recent Labs  Lab 05/02/21 1251 05/06/21 0901 05/08/21 0643  ALBUMIN 2.7* 2.8* 3.0*   CBC: Recent Labs  Lab 05/02/21 1251 05/04/21 0011 05/06/21 0901 05/08/21 0643  WBC 7.4 8.5 7.4 6.6  HGB 10.0* 10.6* 10.1* 11.0*  HCT 32.6* 33.5* 33.4* 34.7*  MCV 97.0 96.5 98.5 95.3  PLT 461* 406* 399 351   Studies/Results: No results found.  Medications:  anticoagulant sodium citrate     anticoagulant sodium citrate      ceFAZolin (ANCEF) IV 1 g (05/07/21 1518)    allopurinol  100 mg Oral Daily   amiodarone  200 mg Oral  Daily   apixaban  5 mg Oral BID   bisacodyl  10 mg Rectal Once   Chlorhexidine Gluconate Cloth  6 each Topical Daily   darbepoetin (ARANESP) injection - DIALYSIS  150 mcg Intravenous Q Thu-HD   docusate sodium  100 mg Oral BID   lanthanum  1,000 mg Oral TID WC   linaclotide  145 mcg Oral QAC breakfast   midodrine  20 mg Oral TID WC   oxyCODONE  10 mg Oral Q12H   polyethylene glycol  17 g Oral BID

## 2021-05-08 NOTE — Progress Notes (Signed)
PT Cancellation Note  Patient Details Name: Phillip Young MRN: [id number] DOB: March 23, 1986   Cancelled Treatment:    Reason Eval/Treat Not Completed: Fatigue/lethargy limiting ability to participate this afternoon due to fatigue following HD. Pt reports that he typically requires 24 hours to recover from HD. Will continue to follow and attempt as time/schedule allows.   [place], PT, DPT   Acute Rehabilitation Department Pager #: [phone]   Sandra Cockayne 05/08/2021, 1:25 PM

## 2021-05-08 NOTE — TOC Progression Note (Addendum)
Transition of Care [facility]) - Progression Note    Patient Details  Name: Phillip Young MRN: [id number] Date of Birth: 07-26-1986  [name] CM/SW Contact  Zenon Mayo, RN Phone Number: 05/08/2021, 2:21 PM  Clinical Narrative:    NCM asked Anderson Malta with [facility] to look at patient again for Phillip Young since he did work with physical therapy yesterday. Anderson Malta says she needs to see where he is participating and not refusing bipap if needed. But right now she does not have HD beds and non becoming available this week but could change next week.    Expected Discharge Plan: [place] Barriers to Discharge: Continued Medical Work up  Expected Discharge Plan and Services Expected Discharge Plan: [place] In-house Referral: Clinical Social Work Discharge Planning Services: CM Consult Post Acute Care Choice: [place] arrangements for the past 2 months: [place]                   DME Agency: [employer] Date DME Agency Contacted: 04/14/21 Time DME Agency Contacted: [phone] Representative spoke with at DME Agency: Andree Coss             Social Determinants of Health (SDOH) Interventions Food Insecurity Interventions: Assist with SNAP Application Financial Strain Interventions: Other (Comment) (Referral to [facility]) Housing Interventions: Intervention Not Indicated Transportation Interventions: Intervention Not Indicated  Readmission Risk Interventions Readmission Risk Prevention Plan 10/16/2020 10/16/2020 10/14/2020  Transportation Screening Complete Complete Complete  PCP or Specialist Appt within 5-7 Days Complete Complete Complete  Home Care Screening Complete - -  Medication Review (RN CM) Referral to Pharmacy - -  Some recent data might be hidden

## 2021-05-08 NOTE — Progress Notes (Signed)
Physical Therapy Treatment Patient Details Name: Phillip Young MRN: [id number] DOB: 1985-12-03 Today's Date: 05/08/2021   History of Present Illness Pt is a 35 y.o. male admitted 04/12/21 with SOB, hypoxia. Workup for MSSA bacteremia, acute hypoxic respiratory failure, cardiorenal syndrome. Pt had catheter holiday with clearance of bacteremia; pt declined reinsertion of HD catheter 9/27. Course complicated by R knee and L hip pain; pelvic CT negative for acute abnormality. Of note, recent admission 02/11/21-03/26/21 with cardiogenic shock, biventricular HF, progression to ESRD with need for HD. Other PMH includes CHF, DM2, aflutter, ESRD (HD TTS).    PT Comments    Pt agreeable to session this afternoon despite HD and subsequent fatigue. Originally agreeable only to sitting EOB, but once there was able to complete x3 sit-stand without assist (minG for safety) as well as stand both with UE support on 4-wheel walker and intermittently without UE support for 10-15 min. The pt demos improved ability to complete bed mobility and sit-stand without assist, but was limited by fatigue, nausea, and pain at this time. Will continue to benefit from skilled PT as he lives alone and has 6 steps to enter his home.     Recommendations for follow up therapy are one component of a multi-disciplinary discharge planning process, led by the attending physician.  Recommendations may be updated based on patient status, additional functional criteria and insurance authorization.  Follow Up Recommendations  Other (comment);SNF (pt remains difficult to place, will work towards home with HHPT but is not yet safe or able to d/c home)     Equipment Recommendations  None recommended by PT (has needed DME)    Recommendations for Other Services       Precautions / Restrictions Precautions Precautions: Fall Precaution Comments: Watch SpO2 (wears 1L O2 baseline) Restrictions Weight Bearing Restrictions: No     Mobility   Bed Mobility Overal bed mobility: Needs Assistance Bed Mobility: Rolling Rolling: Min guard Sidelying to sit: HOB elevated;Min guard   Sit to supine: Max assist;+2 for physical assistance   General bed mobility comments: progressed with gettting to eob, continues to require assistance to return to supine    Transfers Overall transfer level: Needs assistance Equipment used: 4-wheeled walker Transfers: Sit to/from [facility] Sit to Stand: Min guard Stand pivot transfers: Min guard       General transfer comment: minG with pt completing x3 during session without physical assist, limited by L hip pain  Ambulation/Gait Ambulation/Gait assistance: Min guard;+2 safety/equipment Gait Distance (Feet): 3 Feet Assistive device: 4-wheeled walker Gait Pattern/deviations: Step-to pattern;Decreased weight shift to left;Decreased stance time - left;Decreased step length - right;Shuffle Gait velocity: reduced Gait velocity interpretation: <1.31 ft/sec, indicative of household ambulator General Gait Details: decreased wt acceptance on LLE due to pain. minG for safety, pt able to self monitor pain and fatigue, declined further ambulation due to pain at this time      Balance Overall balance assessment: Needs assistance Sitting-balance support: Feet supported Sitting balance-Leahy Scale: Good Sitting balance - Comments: able to maintain sitting EOB   Standing balance support: Single extremity supported;No upper extremity supported;During functional activity Standing balance-Leahy Scale: Fair Standing balance comment: static stance without UE support, BUE support for stepping due to pain                            Cognition Arousal/Alertness: Awake/alert Behavior During Therapy: Restless;Anxious Overall Cognitive Status: Within Functional Limits for tasks assessed  General Comments: apprehensive to participate after  dialysis but performed well      Exercises Other Exercises Other Exercises: sit-stand x3 from EOB, heavy use of UE    General Comments General comments (skin integrity, edema, etc.): VSS on 2L, SpO2 92-96%      Pertinent Vitals/Pain Pain Assessment: Faces Faces Pain Scale: Hurts even more Pain Location: left hip, right knee Pain Descriptors / Indicators: Grimacing;Moaning;Guarding;Constant;Discomfort;Sore Pain Intervention(s): Limited activity within patient's tolerance;Monitored during session;Repositioned     PT Goals (current goals can now be found in the care plan section) Acute Rehab PT Goals Patient Stated Goal: to stand up PT Goal Formulation: With patient Time For Goal Achievement: 05/13/21 Potential to Achieve Goals: Fair Progress towards PT goals: Progressing toward goals    Frequency    Min 3X/week      PT Plan Current plan remains appropriate    Co-evaluation PT/OT/SLP Co-Evaluation/Treatment: Yes Reason for Co-Treatment: For patient/therapist safety;To address functional/ADL transfers PT goals addressed during session: Mobility/safety with mobility;Balance;Strengthening/ROM OT goals addressed during session: Proper use of Adaptive equipment and DME      AM-PAC PT "6 Clicks" Mobility   Outcome Measure  Help needed turning from your back to your side while in a flat bed without using bedrails?: A Little Help needed moving from lying on your back to sitting on the side of a flat bed without using bedrails?: A Little Help needed moving to and from a bed to a chair (including a wheelchair)?: A Little Help needed standing up from a chair using your arms (e.g., wheelchair or bedside chair)?: A Little Help needed to walk in hospital room?: A Lot Help needed climbing 3-5 steps with a railing? : Total 6 Click Score: 15    End of Session Equipment Utilized During Treatment: Oxygen Activity Tolerance: Patient limited by pain Patient left: in bed;with  nursing/sitter in room Nurse Communication: Mobility status PT Visit Diagnosis: Difficulty in walking, not elsewhere classified (R26.2);Other abnormalities of gait and mobility (R26.89);Pain Pain - Right/Left: Left Pain - part of body: Hip;Knee     Time: [id number] PT Time Calculation (min) (ACUTE ONLY): 35 min  Charges:  $Therapeutic Activity: 8-22 mins                     West Carbo, PT, DPT   Acute Rehabilitation Department Pager #: [phone]   Sandra Cockayne 05/08/2021, 3:56 PM

## 2021-05-08 NOTE — Progress Notes (Signed)
Occupational Therapy Treatment Patient Details Name: Phillip Young MRN: [id number] DOB: 1986/06/16 Today's Date: 05/08/2021   History of present illness Pt is a 35 y.o. male admitted 04/12/21 with SOB, hypoxia. Workup for MSSA bacteremia, acute hypoxic respiratory failure, cardiorenal syndrome. Pt had catheter holiday with clearance of bacteremia; pt declined reinsertion of HD catheter 9/27. Course complicated by R knee and L hip pain; pelvic CT negative for acute abnormality. Of note, recent admission 02/11/21-03/26/21 with cardiogenic shock, biventricular HF, progression to ESRD with need for HD. Other PMH includes CHF, DM2, aflutter, ESRD (HD TTS).   OT comments  Patient was received in bed and was apprehensive about participating after HD.  Patient agreed to get to EOB and was able to perform with min assist for safety. Patient was able to stand from eob and used rollator for support. Patient was able to use urinal while standing. Patient began feeling dizzy and sat on eob.  Patient was able to perform 2 sit to stand from eob with increased hip pain with each attempt.  Patient is making progress with OT treatment.  Acute OT to continue to follow.    Recommendations for follow up therapy are one component of a multi-disciplinary discharge planning process, led by the attending physician.  Recommendations may be updated based on patient status, additional functional criteria and insurance authorization.    Follow Up Recommendations  SNF    Equipment Recommendations  3 in 1 bedside commode    Recommendations for Other Services      Precautions / Restrictions Precautions Precautions: Fall Precaution Comments: Watch SpO2 (wears 1L O2 baseline) Restrictions Weight Bearing Restrictions: No       Mobility Bed Mobility Overal bed mobility: Needs Assistance Bed Mobility: Rolling Rolling: Min assist Sidelying to sit: Min assist;HOB elevated   Sit to supine: Max assist;+2 for physical  assistance   General bed mobility comments: progressed with gettting to eob, continues to require assistance to return to supine    Transfers Overall transfer level: Needs assistance Equipment used: 4-wheeled walker Transfers: Sit to/from [facility] Sit to Stand: Min assist;Min guard Stand pivot transfers: Min assist;+2 safety/equipment       General transfer comment: progressed with sit to stands on this date    Balance Overall balance assessment: Needs assistance Sitting-balance support: Feet supported Sitting balance-Leahy Scale: Good Sitting balance - Comments: able to maintain sitting EOB   Standing balance support: Single extremity supported;Bilateral upper extremity supported Standing balance-Leahy Scale: Poor Standing balance comment: used rollator walker for support.  was able to use urinal while standing.                           ADL either performed or assessed with clinical judgement   ADL                                               Vision       Perception     Praxis      Cognition Arousal/Alertness: Awake/alert Behavior During Therapy: Restless;Anxious Overall Cognitive Status: Within Functional Limits for tasks assessed                                 General Comments: apprehensive to participate after dialysis but performed  well        Exercises     Shoulder Instructions       General Comments      Pertinent Vitals/ Pain       Pain Assessment: Faces Faces Pain Scale: Hurts little more Pain Location: left hip, right knee Pain Descriptors / Indicators: Grimacing;Moaning;Guarding;Constant;Discomfort;Sore Pain Intervention(s): Monitored during session;Patient requesting pain meds-RN notified  Home Living                                          Prior Functioning/Environment              Frequency  Min 2X/week        Progress Toward Goals  OT  Goals(current goals can now be found in the care plan section)  Progress towards OT goals: Progressing toward goals  Acute Rehab OT Goals Patient Stated Goal: to stand up OT Goal Formulation: With patient Time For Goal Achievement: 05/09/21 Potential to Achieve Goals: Good ADL Goals Pt Will Perform Grooming: with set-up;standing Pt Will Perform Lower Body Bathing: with min guard assist;sit to/from stand Pt Will Transfer to Toilet: with min guard assist;stand pivot transfer;bedside commode Pt/caregiver will Perform Home Exercise Program: Increased ROM;Left upper extremity;Independently;With written HEP provided  Plan Discharge plan remains appropriate    Co-evaluation    PT/OT/SLP Co-Evaluation/Treatment: Yes Reason for Co-Treatment: For patient/therapist safety;To address functional/ADL transfers   OT goals addressed during session: Proper use of Adaptive equipment and DME      AM-PAC OT "6 Clicks" Daily Activity     Outcome Measure   Help from another person eating meals?: None Help from another person taking care of personal grooming?: A Little Help from another person toileting, which includes using toliet, bedpan, or urinal?: A Lot Help from another person bathing (including washing, rinsing, drying)?: A Lot Help from another person to put on and taking off regular upper body clothing?: A Little Help from another person to put on and taking off regular lower body clothing?: A Lot 6 Click Score: 16    End of Session Equipment Utilized During Treatment: Gait belt;Rolling walker;Oxygen  OT Visit Diagnosis: Unsteadiness on feet (R26.81);Other abnormalities of gait and mobility (R26.89);Muscle weakness (generalized) (M62.81);Pain Pain - Right/Left: Left Pain - part of body: Hip   Activity Tolerance Patient limited by pain;Patient limited by fatigue   Patient Left in bed;with call bell/phone within reach   Nurse Communication Mobility status        Time:  1453-1530 OT Time Calculation (min): 37 min  Charges: OT General Charges $OT Visit: 1 Visit OT Treatments $Therapeutic Activity: 8-22 mins  Lodema Hong, Kimberly  Pager [phone] Office [place] 05/08/2021, 3:39 PM

## 2021-05-08 NOTE — Progress Notes (Signed)
PROGRESS NOTE    Phillip Young  [id number] DOB: Mar 28, 1986 DOA: 04/12/2021 PCP: [name], MD   Brief Narrative: 35 year old with past medical history significant for severe biventricular systolic heart failure with known ejection fraction less than 20%, diabetes type 2, paroxysmal a flutter on Eliquis, recently started on hemodialysis with recent extensive hospitalization with cardiogenic shock and cardiorenal syndrome progressed to ESRD presented back to the emergency room with shortness of breath, left wrist and right knee pain, unable to ambulate.  Home oxygen concentrator not working.  Chest x-ray showed pulmonary edema, COVID-19 negative.  Patient was found to have MSSA bacteremia due to infected dialysis catheter.  ID and nephrology consulted.  After catheter holiday he received new permanent cath on 9/28 and restarted on hemodialysis. Patient has been declining treatments for the last week on and off.  He now has agreed to dialysis and MRI to evaluate hip pain.   Assessment & Plan:   Principal Problem:   MSSA bacteremia Active Problems:   Morbid obesity ([place])   GERD without esophagitis   OSA (obstructive sleep apnea)   Chronic systolic CHF (congestive heart failure) ([name])   Paroxysmal atrial flutter ([name])   Hyponatremia   ESRD (end stage renal disease) on dialysis ([place])   Acute on chronic respiratory failure with hypoxia ([name])   Medical non-compliance   Hypotension   ESRD on dialysis [facility])   Palliative care by specialist   Full code status   Concern about end of life   1-MSSA Bacteremia; Arthralgia;    Left SI septic arthritis, sacrum and iliac bone osteomyelitis elongated abscess involving the left iliacus muscle, small abscess left piriformis muscle/MSSA bacteremia: CT left Hip 9/25 destructive changes. CT scan right knee no acute finding. Blood culture 9/22. Positive for MSSA.  Repeated Blood cultures from 04/19/2021 NTD.ECHO; no evidence of vegetation.   Perm cath removed. He was extremely reluctant but then allowed and eventually underwent TEE 10/11 which was negative for endocarditis.  ID consulted, recommend IV antibiotics for 6 weeks. Until Nov 6th.  On pain medication: dilaudid and Oxy but pain not controlled.  Will discontinue Dilaudid.  Pain well controlled, continue oxycodone 10 mg every 12 hours with as needed short acting oxycodone 10 mg every 6 hours.  -Ortho consulted. Patient is too high risk for surgery. Per IR, abscess is small and even with CT scan, it is in a location that may not be amenable for drainage, for this reason and to avoid to give more contrast to patient to prevent worsening to any residual  renal function he has left wont proceed with CT scan or procedure. Per ortho there is not significant therapeutic improvement for mobility with drainage of abscess. plan to treat with IV antibiotics. He will need Imagine after completion of antibiotics.  Assessed by PT OT, needs SNF.  TOC on board.  Acute on chronic respiratory failure with hypoxemia and hypercapnia: Due to malfunctioning concentrator at home now with fluid overload Volume managed with hemodialysis. Nephrology following.  Currently on baseline oxygen requirement.  ESRD on hemodialysis: Had a new PermCath He has been declining dialysis on and off.  Was evaluated by palliative care, patient agreed to have hemodialysis and further treatments.   Biventricular heart failure, paroxysmal A. fib, hypotension: Ejection fraction less than 20%.  Followed by heart failure team.  Intolerance to advanced heart failure therapies.  On midodrine 20 3 times daily Volume managed with hemodialysis. On amiodarone 200 mg p.o. daily for paroxysmal A. fib.  Morbid obesity: BMI more than 50. On Semaglutide.   Hyponatremia; volume manage with HD.   Constipation; had BM   Stage I decubitus ulcer at the medial left gluteal fold: Primary RN informed to place some  dressing.  Estimated body mass index is 64.58 kg/m as calculated from the following:   Height as of this encounter: '5\' 11"'$  (1.803 m).   Weight as of this encounter: 210 kg.   DVT prophylaxis: Eliquis Code Status: Full Code Family Communication: None present. Disposition Plan:  Status is: Inpatient  Remains inpatient appropriate because: Unsafe discharge plan  Dispo: The patient is from: Home              Anticipated d/c is to: SNF              Patient currently is  medically stable to d/c.    Difficult to place patient yes   Consultants:  ID Nephrology  Heart failure team  Procedures:  HD  Antimicrobials:  Cefazolin   Subjective: Patient seen and examined in dialysis unit.  He has no new complaint.  Pain controlled.   Objective: Vitals:   05/08/21 1030 05/08/21 1042 05/08/21 1052 05/08/21 1137  BP: (!) 79/60 103/87 109/68 (!) 148/111  Pulse: (!) 56   62  Resp:  '16 16 18  '$ Temp:  97.8 F (36.6 C) 97.8 F (36.6 C) 98 F (36.7 C)  TempSrc:    Oral  SpO2:   100% 100%  Weight:      Height:        Intake/Output Summary (Last 24 hours) at 05/08/2021 1311 Last data filed at 05/08/2021 1100 Gross per 24 hour  Intake 712 ml  Output 3000 ml  Net -2288 ml    Filed Weights   05/08/21 0729  Weight: (!) 210 kg    Examination:  General exam: Appears calm and comfortable, morbidly obese Respiratory system: Clear to auscultation. Respiratory effort normal. Cardiovascular system: S1 & S2 heard, RRR. No JVD, murmurs, rubs, gallops or clicks.  +1 pitting edema bilateral lower extremity Gastrointestinal system: Abdomen is nondistended, soft and nontender. No organomegaly or masses felt. Normal bowel sounds heard. Central nervous system: Alert and oriented. No focal neurological deficits. Extremities: Symmetric 5 x 5 power. Skin: No rashes, lesions or ulcers.  Psychiatry: Judgement and insight appear normal. Mood & affect appropriate.   Data Reviewed: I have  personally reviewed following labs and imaging studies  CBC: Recent Labs  Lab 05/02/21 1251 05/04/21 0011 05/06/21 0901 05/08/21 0643  WBC 7.4 8.5 7.4 6.6  HGB 10.0* 10.6* 10.1* 11.0*  HCT 32.6* 33.5* 33.4* 34.7*  MCV 97.0 96.5 98.5 95.3  PLT 461* 406* 399 [id number]    Basic Metabolic Panel: Recent Labs  Lab 05/02/21 1251 05/02/21 1944 05/03/21 0041 05/04/21 0011 05/06/21 0901 05/08/21 0643  NA 127* 130* 129* 130* 133* 130*  K 4.3 4.2 4.0 4.2 4.5 5.0  CL 91* 93* 94* 95* 96* 94*  CO2 '27 25 25 26 26 24  '$ GLUCOSE 91 109* 123* 105* 100* 87  BUN 38* 21* 22* 31* 37* 34*  CREATININE 4.23* 3.16* 3.65* 4.35* 3.74* 5.12*  CALCIUM 9.8 9.5 9.7 9.8 10.0 10.4*  PHOS 5.3*  --   --   --  6.4* 7.5*    GFR: Estimated Creatinine Clearance: 36.4 mL/min (A) (by C-G formula based on SCr of 5.12 mg/dL (H)). Liver Function Tests: Recent Labs  Lab 05/02/21 1251 05/06/21 0901 05/08/21 0643  ALBUMIN 2.7* 2.8* 3.0*  No results for input(s): LIPASE, AMYLASE in the last 168 hours. No results for input(s): AMMONIA in the last 168 hours. Coagulation Profile: No results for input(s): INR, PROTIME in the last 168 hours.  Cardiac Enzymes: No results for input(s): CKTOTAL, CKMB, CKMBINDEX, TROPONINI in the last 168 hours. BNP (last 3 results) No results for input(s): PROBNP in the last 8760 hours. HbA1C: No results for input(s): HGBA1C in the last 72 hours. CBG: No results for input(s): GLUCAP in the last 168 hours. Lipid Profile: No results for input(s): CHOL, HDL, LDLCALC, TRIG, CHOLHDL, LDLDIRECT in the last 72 hours. Thyroid Function Tests: No results for input(s): TSH, T4TOTAL, FREET4, T3FREE, THYROIDAB in the last 72 hours. Anemia Panel: No results for input(s): VITAMINB12, FOLATE, FERRITIN, TIBC, IRON, RETICCTPCT in the last 72 hours. Sepsis Labs: No results for input(s): PROCALCITON, LATICACIDVEN in the last 168 hours.  No results found for this or any previous visit (from the  past 240 hour(s)).   Radiology Studies: No results found.   Scheduled Meds:  allopurinol  100 mg Oral Daily   amiodarone  200 mg Oral Daily   apixaban  5 mg Oral BID   bisacodyl  10 mg Rectal Once   Chlorhexidine Gluconate Cloth  6 each Topical Daily   darbepoetin (ARANESP) injection - DIALYSIS  150 mcg Intravenous Q Fri-HD   docusate sodium  100 mg Oral BID   lanthanum  1,000 mg Oral TID WC   linaclotide  145 mcg Oral QAC breakfast   midodrine  20 mg Oral TID WC   oxyCODONE  10 mg Oral Q12H   polyethylene glycol  17 g Oral BID   Continuous Infusions:  anticoagulant sodium citrate     anticoagulant sodium citrate      ceFAZolin (ANCEF) IV 1 g (05/07/21 1518)     LOS: 22 days   Time spent: 29 minutes.   Darliss Cheney, MD [facility]  If 7PM-7AM, please contact night-coverage [email or web address]  05/08/2021, 1:11 PM

## 2021-05-09 DIAGNOSIS — R7881 Bacteremia: Secondary | ICD-10-CM | POA: Diagnosis not present

## 2021-05-09 DIAGNOSIS — B9561 Methicillin susceptible Staphylococcus aureus infection as the cause of diseases classified elsewhere: Secondary | ICD-10-CM | POA: Diagnosis not present

## 2021-05-09 NOTE — Progress Notes (Signed)
[facility] Progress Note   Dialysis Orders: TTS/ AKI [facility] 4h  400/800    188.5kg   2/2 bath  TDC RIJ  Hep none  - no in center meds  Assessment/Plan: MSSA bacteremia: Blood Cx 9/22 + and catheter exit site purulence. S/p [facility] removal/line holiday 9/24 -> 9/28 new TDC placed. Has assoc left-sided SI joint septic athritis w/ local osteo and 2 associated small abscesses. Per ortho too high risk for surgical Rx. Per IR abscesses are small, would need IV contrast CT to attempt drain and even with that they may not be amenable to drainage; so drainage is not being pursued. On IV Cefazolin x 6 week course (thru 11/5). S/p TEE 10/11-neg for endocarditis. Per primary service Volume: improved, uf as tolerated on hd ESRD: on HD TTS.  10/6 2.8L, 10/8 1.9L, 10/12 net UF 3L  Tolerated  HD through RIJ TC on 10/14 w/ 3L net UF  Plan on HD Sat as well to get back on schedule but he wants to wait till Monday and then afterwards to get back on TTS regimen. No absolute indication for RRT and he is comfortable.  Counseled again as he was asking if he kidney function will return or if there's a supplement to help restore function. He thinks Lasix caused him to go on dialysis and I educated him that the low level of cardiac function is what caused the kidney function to deteriorate to the point of requiring RRT.  History of biventricular congestive heart failure: Not a candidate for LVAD or transplant, per notes. Hypotension: Stable on midodrine '20mg'$  TID. Atrial fibrillation: On amiodarone + Eliquis. CKD MBD: COrrCa high, Phos were high -> increased Fosrenol binders to '1000mg'$  TID. PTH was 42. P7.5 10/14 (recheck on mon or tues) Anemia of chronic kidney disease: Hgb 9- 11 - continue Aranesp 176mg weekly.  Hyponatremia: managing with HD, 137Na bath, Uf'ing as tolerated   Subjective:  Still has some nausea but no vomiting. Denies f/c/myalgias. Standing with  assistance.  Objective Vitals:   05/08/21 1945 05/08/21 2309 05/09/21 0344 05/09/21 0735  BP: 110/65 117/79 105/63 118/71  Pulse: 82 86 77 78  Resp: 17 (!) '22 17 14  '$ Temp: 98.1 F (36.7 C) 98.2 F (36.8 C) 98.2 F (36.8 C) 98.1 F (36.7 C)  TempSrc: Oral Oral Oral Oral  SpO2: 97% 93% 93% 93%  Weight:      Height:       Physical Exam General: Obese, nad Heart: RRR; no murmur Lungs: CTA anteriorly Abdomen: obese, soft, non-tender Extremities: trace LE edema Neuro: awake, alert Dialysis Access:  TDC in R chest  Additional Objective Labs: Basic Metabolic Panel: Recent Labs  Lab 05/02/21 1251 05/02/21 1944 05/04/21 0011 05/06/21 0901 05/08/21 0643  NA 127*   < > 130* 133* 130*  K 4.3   < > 4.2 4.5 5.0  CL 91*   < > 95* 96* 94*  CO2 27   < > '26 26 24  '$ GLUCOSE 91   < > 105* 100* 87  BUN 38*   < > 31* 37* 34*  CREATININE 4.23*   < > 4.35* 3.74* 5.12*  CALCIUM 9.8   < > 9.8 10.0 10.4*  PHOS 5.3*  --   --  6.4* 7.5*   < > = values in this interval not displayed.   Liver Function Tests: Recent Labs  Lab 05/02/21 1251 05/06/21 0901 05/08/21 0643  ALBUMIN 2.7* 2.8* 3.0*   CBC:  Recent Labs  Lab 05/02/21 1251 05/04/21 0011 05/06/21 0901 05/08/21 0643  WBC 7.4 8.5 7.4 6.6  HGB 10.0* 10.6* 10.1* 11.0*  HCT 32.6* 33.5* 33.4* 34.7*  MCV 97.0 96.5 98.5 95.3  PLT 461* 406* 399 351   Studies/Results: No results found.  Medications:  anticoagulant sodium citrate     anticoagulant sodium citrate      ceFAZolin (ANCEF) IV Stopped (05/08/21 1825)    allopurinol  100 mg Oral Daily   amiodarone  200 mg Oral Daily   apixaban  5 mg Oral BID   bisacodyl  10 mg Rectal Once   Chlorhexidine Gluconate Cloth  6 each Topical Daily   darbepoetin (ARANESP) injection - DIALYSIS  150 mcg Intravenous Q Fri-HD   docusate sodium  100 mg Oral BID   lanthanum  1,000 mg Oral TID WC   linaclotide  145 mcg Oral QAC breakfast   midodrine  20 mg Oral TID WC   multivitamin  1  tablet Oral QHS   oxyCODONE  10 mg Oral Q12H   polyethylene glycol  17 g Oral BID

## 2021-05-09 NOTE — Progress Notes (Signed)
PROGRESS NOTE    [facility]  [id number] DOB: 1986/05/04 DOA: 04/12/2021 PCP: Phillip Pope, MD   Brief Narrative: 35 year old with past medical history significant for severe biventricular systolic heart failure with known ejection fraction less than 20%, diabetes type 2, paroxysmal a flutter on Eliquis, recently started on hemodialysis with recent extensive hospitalization with cardiogenic shock and cardiorenal syndrome progressed to ESRD presented back to the emergency room with shortness of breath, left wrist and right knee pain, unable to ambulate.  Home oxygen concentrator not working.  Chest x-ray showed pulmonary edema, COVID-19 negative.  Patient was found to have MSSA bacteremia due to infected dialysis catheter.  ID and nephrology consulted.  After catheter holiday he received new permanent cath on 9/28 and restarted on hemodialysis. Patient has been declining treatments for the last week on and off.  He now has agreed to dialysis and MRI to evaluate hip pain.   Assessment & Plan:   Principal Problem:   MSSA bacteremia Active Problems:   Morbid obesity ([place])   GERD without esophagitis   OSA (obstructive sleep apnea)   Chronic systolic CHF (congestive heart failure) ([name])   Paroxysmal atrial flutter ([name])   Hyponatremia   ESRD (end stage renal disease) on dialysis ([name])   Acute on chronic respiratory failure with hypoxia ([name])   Medical non-compliance   Hypotension   ESRD on dialysis [facility])   Palliative care by specialist   Full code status   Concern about end of life   1-MSSA Bacteremia; Arthralgia;    Left SI septic arthritis, sacrum and iliac bone osteomyelitis elongated abscess involving the left iliacus muscle, small abscess left piriformis muscle/MSSA bacteremia: CT left Hip 9/25 destructive changes. CT scan right knee no acute finding. Blood culture 9/22. Positive for MSSA.  Repeated Blood cultures from 04/19/2021 NTD.ECHO; no evidence of vegetation.   Perm cath removed. He was extremely reluctant but then allowed and eventually underwent TEE 10/11 which was negative for endocarditis.  ID consulted, recommend IV antibiotics for 6 weeks. Until Nov 6th.   Pain well controlled, continue oxycodone 10 mg every 12 hours with as needed short acting oxycodone 10 mg every 6 hours.  -Ortho consulted. Patient is too high risk for surgery. Per IR, abscess is small and even with CT scan, it is in a location that may not be amenable for drainage, for this reason and to avoid to give more contrast to patient to prevent worsening to any residual  renal function he has left wont proceed with CT scan or procedure. Per ortho there is not significant therapeutic improvement for mobility with drainage of abscess. plan to treat with IV antibiotics. He will need Imagine after completion of antibiotics.  Assessed by PT OT, needs SNF.  TOC on board.  Acute on chronic respiratory failure with hypoxemia and hypercapnia: Due to malfunctioning concentrator at home now with fluid overload Volume managed with hemodialysis. Nephrology following.  Currently on baseline oxygen requirement.  ESRD on hemodialysis: Had a new PermCath He has been declining dialysis on and off.  Was evaluated by palliative care, patient agreed to have hemodialysis and further treatments.   Biventricular heart failure, paroxysmal A. fib, hypotension: Ejection fraction less than 20%.  Followed by heart failure team.  Intolerance to advanced heart failure therapies.  On midodrine 20 3 times daily Volume managed with hemodialysis. On amiodarone 200 mg p.o. daily for paroxysmal A. fib.  Morbid obesity: BMI more than 50. On Semaglutide.   Hyponatremia; volume manage with  HD.   Constipation; had BM   Stage I decubitus ulcer at the medial left gluteal fold: Dressing Per nurses  Estimated body mass index is 64.58 kg/m as calculated from the following:   Height as of this encounter: '5\' 11"'$  (1.803  m).   Weight as of this encounter: 210 kg.   DVT prophylaxis: Eliquis Code Status: Full Code Family Communication: None present. Disposition Plan:  Status is: Inpatient  Remains inpatient appropriate because: Unsafe discharge plan  Dispo: The patient is from: Home              Anticipated d/c is to: SNF              Patient currently is  medically stable to d/c.    Difficult to place patient yes   Consultants:  ID Nephrology  Heart failure team  Procedures:  HD  Antimicrobials:  Cefazolin   Subjective: Seen and examined.  No complaints.   Objective: Vitals:   05/08/21 1945 05/08/21 2309 05/09/21 0344 05/09/21 0735  BP: 110/65 117/79 105/63 118/71  Pulse: 82 86 77 78  Resp: 17 (!) '22 17 14  '$ Temp: 98.1 F (36.7 C) 98.2 F (36.8 C) 98.2 F (36.8 C) 98.1 F (36.7 C)  TempSrc: Oral Oral Oral Oral  SpO2: 97% 93% 93% 93%  Weight:      Height:        Intake/Output Summary (Last 24 hours) at 05/09/2021 1038 Last data filed at 05/09/2021 0857 Gross per 24 hour  Intake 1440 ml  Output 3275 ml  Net -1835 ml    Filed Weights   05/08/21 0729  Weight: (!) 210 kg    Examination:  General exam: Appears calm and comfortable, morbidly obese Respiratory system: Clear to auscultation. Respiratory effort normal. Cardiovascular system: S1 & S2 heard, RRR. No JVD, murmurs, rubs, gallops or clicks. No pedal edema. Gastrointestinal system: Abdomen is nondistended, soft and nontender. No organomegaly or masses felt. Normal bowel sounds heard. Central nervous system: Alert and oriented. No focal neurological deficits. Extremities: Symmetric 5 x 5 power. Skin: No rashes, lesions or ulcers.  Psychiatry: Judgement and insight appear poor   Data Reviewed: I have personally reviewed following labs and imaging studies  CBC: Recent Labs  Lab 05/02/21 1251 05/04/21 0011 05/06/21 0901 05/08/21 0643  WBC 7.4 8.5 7.4 6.6  HGB 10.0* 10.6* 10.1* 11.0*  HCT 32.6* 33.5*  33.4* 34.7*  MCV 97.0 96.5 98.5 95.3  PLT 461* 406* 399 [id number]    Basic Metabolic Panel: Recent Labs  Lab 05/02/21 1251 05/02/21 1944 05/03/21 0041 05/04/21 0011 05/06/21 0901 05/08/21 0643  NA 127* 130* 129* 130* 133* 130*  K 4.3 4.2 4.0 4.2 4.5 5.0  CL 91* 93* 94* 95* 96* 94*  CO2 '27 25 25 26 26 24  '$ GLUCOSE 91 109* 123* 105* 100* 87  BUN 38* 21* 22* 31* 37* 34*  CREATININE 4.23* 3.16* 3.65* 4.35* 3.74* 5.12*  CALCIUM 9.8 9.5 9.7 9.8 10.0 10.4*  PHOS 5.3*  --   --   --  6.4* 7.5*    GFR: Estimated Creatinine Clearance: 36.4 mL/min (A) (by C-G formula based on SCr of 5.12 mg/dL (H)). Liver Function Tests: Recent Labs  Lab 05/02/21 1251 05/06/21 0901 05/08/21 0643  ALBUMIN 2.7* 2.8* 3.0*    No results for input(s): LIPASE, AMYLASE in the last 168 hours. No results for input(s): AMMONIA in the last 168 hours. Coagulation Profile: No results for input(s): INR, PROTIME in the last  168 hours.  Cardiac Enzymes: No results for input(s): CKTOTAL, CKMB, CKMBINDEX, TROPONINI in the last 168 hours. BNP (last 3 results) No results for input(s): PROBNP in the last 8760 hours. HbA1C: No results for input(s): HGBA1C in the last 72 hours. CBG: No results for input(s): GLUCAP in the last 168 hours. Lipid Profile: No results for input(s): CHOL, HDL, LDLCALC, TRIG, CHOLHDL, LDLDIRECT in the last 72 hours. Thyroid Function Tests: No results for input(s): TSH, T4TOTAL, FREET4, T3FREE, THYROIDAB in the last 72 hours. Anemia Panel: No results for input(s): VITAMINB12, FOLATE, FERRITIN, TIBC, IRON, RETICCTPCT in the last 72 hours. Sepsis Labs: No results for input(s): PROCALCITON, LATICACIDVEN in the last 168 hours.  No results found for this or any previous visit (from the past 240 hour(s)).   Radiology Studies: No results found.   Scheduled Meds:  allopurinol  100 mg Oral Daily   amiodarone  200 mg Oral Daily   apixaban  5 mg Oral BID   bisacodyl  10 mg Rectal Once    Chlorhexidine Gluconate Cloth  6 each Topical Daily   darbepoetin (ARANESP) injection - DIALYSIS  150 mcg Intravenous Q Fri-HD   docusate sodium  100 mg Oral BID   lanthanum  1,000 mg Oral TID WC   linaclotide  145 mcg Oral QAC breakfast   midodrine  20 mg Oral TID WC   multivitamin  1 tablet Oral QHS   oxyCODONE  10 mg Oral Q12H   polyethylene glycol  17 g Oral BID   Continuous Infusions:  anticoagulant sodium citrate     anticoagulant sodium citrate      ceFAZolin (ANCEF) IV Stopped (05/08/21 1825)     LOS: 23 days   Time spent: 28 minutes.   Darliss Cheney, MD [facility]  If 7PM-7AM, please contact night-coverage [email or web address]  05/09/2021, 10:38 AM

## 2021-05-10 DIAGNOSIS — B9561 Methicillin susceptible Staphylococcus aureus infection as the cause of diseases classified elsewhere: Secondary | ICD-10-CM | POA: Diagnosis not present

## 2021-05-10 DIAGNOSIS — R7881 Bacteremia: Secondary | ICD-10-CM | POA: Diagnosis not present

## 2021-05-10 MED ORDER — SODIUM ZIRCONIUM CYCLOSILICATE 10 G PO PACK
10.0000 g | PACK | Freq: Once | ORAL | Status: DC
  Filled 2021-05-10: qty 1

## 2021-05-10 MED ORDER — DARBEPOETIN ALFA 100 MCG/0.5ML IJ SOSY: 100.0000 ug | PREFILLED_SYRINGE | INTRAMUSCULAR | Status: DC

## 2021-05-10 NOTE — Progress Notes (Signed)
Mobility Specialist Progress Note:    05/10/21 1700  Mobility  Activity Refused mobility   Pt refused d/t  " it's football Sunday and I just want to lay here chill and watch football".   [facility] and safety inspector Phone [phone]

## 2021-05-10 NOTE — Progress Notes (Signed)
[facility] Progress Note   Subjective:   Seen in room. Denies CP/dyspnea (on nasal O2). Hip pain is bad. Refused HD yesterday -> planning next on Monday 10/17. Will order dose of Lokelma for today as not dialyzed since Thursday.  Objective Vitals:   05/09/21 1940 05/09/21 2325 05/10/21 0236 05/10/21 0723  BP: 117/86 122/76 122/81 110/68  Pulse: 83 77 84 84  Resp: (!) '22 19 16 20  '$ Temp: 98.1 F (36.7 C) 97.9 F (36.6 C) 97.9 F (36.6 C) 98.1 F (36.7 C)  TempSrc: Oral Oral Oral Oral  SpO2: 93% 95% 98% 98%  Weight:      Height:       Physical Exam General: Obese man, NAD. Nasal O2 in place Heart: RRR; no murmur Lungs: CTA anteriorly Abdomen: soft Extremities: Trace LE edema Dialysis Access: [facility]  Additional Objective Labs: Basic Metabolic Panel: Recent Labs  Lab 05/04/21 0011 05/06/21 0901 05/08/21 0643  NA 130* 133* 130*  K 4.2 4.5 5.0  CL 95* 96* 94*  CO2 '26 26 24  '$ GLUCOSE 105* 100* 87  BUN 31* 37* 34*  CREATININE 4.35* 3.74* 5.12*  CALCIUM 9.8 10.0 10.4*  PHOS  --  6.4* 7.5*   Liver Function Tests: Recent Labs  Lab 05/06/21 0901 05/08/21 0643  ALBUMIN 2.8* 3.0*   CBC: Recent Labs  Lab 05/04/21 0011 05/06/21 0901 05/08/21 0643  WBC 8.5 7.4 6.6  HGB 10.6* 10.1* 11.0*  HCT 33.5* 33.4* 34.7*  MCV 96.5 98.5 95.3  PLT 406* 399 351   Medications:  anticoagulant sodium citrate     anticoagulant sodium citrate      ceFAZolin (ANCEF) IV Stopped (05/09/21 1447)    allopurinol  100 mg Oral Daily   amiodarone  200 mg Oral Daily   apixaban  5 mg Oral BID   bisacodyl  10 mg Rectal Once   Chlorhexidine Gluconate Cloth  6 each Topical Daily   darbepoetin (ARANESP) injection - DIALYSIS  150 mcg Intravenous Q Fri-HD   docusate sodium  100 mg Oral BID   lanthanum  1,000 mg Oral TID WC   linaclotide  145 mcg Oral QAC breakfast   midodrine  20 mg Oral TID WC   multivitamin  1 tablet Oral QHS   oxyCODONE  10 mg Oral Q12H   polyethylene  glycol  17 g Oral BID    Dialysis Orders: TTS/ AKI [facility] 4h  400/800    188.5kg   2/2 bath  TDC RIJ  Hep none  - no in center meds   Assessment/Plan: MSSA bacteremia: Blood Cx 9/22 + and catheter exit site purulence. S/p [facility] removal/line holiday 9/24 -> 9/28 new TDC placed. Has assoc left-sided SI joint septic athritis w/ local osteo and 2 associated small abscesses. Per ortho too high risk for surgical Rx. Per IR abscesses are small, would need IV contrast CT to attempt drain and even with that they may not be amenable to drainage; so drainage is not being pursued. On IV Cefazolin x 6 week course (thru 11/5). S/p TEE 10/11-neg for endocarditis. Per primary service BP/volume: Chronic hypotension on mido '20mg'$  TID, volume overall improving despite weight going up this admit. UF as tolerated with HD. ESRD: Usual TTS schedule - intermittently refusing. Dialyzed Wed/Fri this week, refused on Saturday. Planning HD tomorrow, then back to TTS schedule by the end of the week (likely Mon/Thurs/Sat next week). Lokelma 10g x 1 today to keep K at [place]. Biventricular congestive heart  failure: Not a candidate for LVAD or transplant, per notes. Atrial fibrillation: On amiodarone + Eliquis. CKD MBD: Ca/Phos both high. On Fosrenol as binder -> refuses often. PTH low (42). Hypercalcemia possibly due to immobility and osteo. Consider  additional hyperCa work-up if rises further. 2Ca bath with HD for now. Anemia of chronic kidney disease: Hgb 11,  reducing weekly Aranesp dose from 150 to 100.   Veneta Penton, PA-C 05/10/2021, 8:55 AM  [employer]

## 2021-05-10 NOTE — Progress Notes (Signed)
PROGRESS NOTE    Phillip Young  [id number] DOB: 10/03/1985 DOA: 04/12/2021 PCP: [name], MD   Brief Narrative: 35 year old with past medical history significant for severe biventricular systolic heart failure with known ejection fraction less than 20%, diabetes type 2, paroxysmal a flutter on Eliquis, recently started on hemodialysis with recent extensive hospitalization with cardiogenic shock and cardiorenal syndrome progressed to ESRD presented back to the emergency room with shortness of breath, left wrist and right knee pain, unable to ambulate.  Home oxygen concentrator not working.  Chest x-ray showed pulmonary edema, COVID-19 negative.  Patient was found to have MSSA bacteremia due to infected dialysis catheter.  ID and nephrology consulted.  After catheter holiday he received new permanent cath on 9/28 and restarted on hemodialysis. Patient has been declining treatments for the last week on and off.  He now has agreed to dialysis and MRI to evaluate hip pain.   Assessment & Plan:   Principal Problem:   MSSA bacteremia Active Problems:   Morbid obesity ([place])   GERD without esophagitis   OSA (obstructive sleep apnea)   Chronic systolic CHF (congestive heart failure) ([name])   Paroxysmal atrial flutter ([name])   Hyponatremia   ESRD (end stage renal disease) on dialysis ([place])   Acute on chronic respiratory failure with hypoxia ([name])   Medical non-compliance   Hypotension   ESRD on dialysis [facility])   Palliative care by specialist   Full code status   Concern about end of life   1-MSSA Bacteremia; Arthralgia;    Left SI septic arthritis, sacrum and iliac bone osteomyelitis elongated abscess involving the left iliacus muscle, small abscess left piriformis muscle/MSSA bacteremia: CT left Hip 9/25 destructive changes. CT scan right knee no acute finding. Blood culture 9/22. Positive for MSSA.  Repeated Blood cultures from 04/19/2021 NTD.ECHO; no evidence of vegetation.   Perm cath removed. He was extremely reluctant but then allowed and eventually underwent TEE 10/11 which was negative for endocarditis.  ID consulted, recommend IV antibiotics for 6 weeks. Until Nov 6th.   Pain well controlled, continue oxycodone 10 mg every 12 hours with as needed short acting oxycodone 10 mg every 6 hours.  -Ortho consulted. Patient is too high risk for surgery. Per IR, abscess is small and even with CT scan, it is in a location that may not be amenable for drainage, for this reason and to avoid to give more contrast to patient to prevent worsening to any residual  renal function he has left wont proceed with CT scan or procedure. Per ortho there is not significant therapeutic improvement for mobility with drainage of abscess. plan to treat with IV antibiotics. He will need Imagine after completion of antibiotics.  Assessed by PT OT, needs SNF.  TOC on board.  Acute on chronic respiratory failure with hypoxemia and hypercapnia: Due to malfunctioning concentrator at home now with fluid overload Volume managed with hemodialysis. Nephrology following.  Currently on baseline oxygen requirement.  ESRD on hemodialysis: Had a new PermCath He had been declining dialysis on and off but now seems to be fairly compliant. Was evaluated by palliative care, patient agreed to have hemodialysis and further treatments.   Biventricular heart failure, paroxysmal A. fib, hypotension: Ejection fraction less than 20%.  Followed by heart failure team.  Intolerance to advanced heart failure therapies.  On midodrine 20 3 times daily Volume managed with hemodialysis. On amiodarone 200 mg p.o. daily for paroxysmal A. fib.  Morbid obesity: BMI more than 50. On Semaglutide.  Hyponatremia; volume manage with HD.   Constipation; had BM   Stage I decubitus ulcer at the medial left gluteal fold: Dressing Per nurses  Estimated body mass index is 64.58 kg/m as calculated from the following:   Height  as of this encounter: '5\' 11"'$  (1.803 m).   Weight as of this encounter: 210 kg.   DVT prophylaxis: Eliquis Code Status: Full Code Family Communication: None present. Disposition Plan:  Status is: Inpatient  Remains inpatient appropriate because: Unsafe discharge plan  Dispo: The patient is from: Home              Anticipated d/c is to: SNF              Patient currently is  medically stable to d/c.    Difficult to place patient yes   Consultants:  ID Nephrology  Heart failure team  Procedures:  HD  Antimicrobials:  Cefazolin   Subjective: Patient seen and examined.  He has no complaints.   Objective: Vitals:   05/09/21 2325 05/10/21 0236 05/10/21 0723 05/10/21 1117  BP: 122/76 122/81 110/68 108/64  Pulse: 77 84 84 82  Resp: '19 16 20 16  '$ Temp: 97.9 F (36.6 C) 97.9 F (36.6 C) 98.1 F (36.7 C) 98.2 F (36.8 C)  TempSrc: Oral Oral Oral Oral  SpO2: 95% 98% 98% 97%  Weight:      Height:        Intake/Output Summary (Last 24 hours) at 05/10/2021 1159 Last data filed at 05/10/2021 1100 Gross per 24 hour  Intake 720 ml  Output 0 ml  Net 720 ml    Filed Weights   05/08/21 0729  Weight: (!) 210 kg    Examination:  General exam: Appears calm and comfortable, morbidly obese Respiratory system: Clear to auscultation. Respiratory effort normal. Cardiovascular system: S1 & S2 heard, RRR. No JVD, murmurs, rubs, gallops or clicks.  Trace pitting edema bilateral lower extremity Gastrointestinal system: Abdomen is nondistended, soft and nontender. No organomegaly or masses felt. Normal bowel sounds heard. Central nervous system: Alert and oriented. No focal neurological deficits. Extremities: Symmetric 5 x 5 power. Skin: No rashes, lesions or ulcers.  Psychiatry: Judgement and insight appear poor   Data Reviewed: I have personally reviewed following labs and imaging studies  CBC: Recent Labs  Lab 05/04/21 0011 05/06/21 0901 05/08/21 0643  WBC 8.5 7.4  6.6  HGB 10.6* 10.1* 11.0*  HCT 33.5* 33.4* 34.7*  MCV 96.5 98.5 95.3  PLT 406* 399 [id number]    Basic Metabolic Panel: Recent Labs  Lab 05/04/21 0011 05/06/21 0901 05/08/21 0643  NA 130* 133* 130*  K 4.2 4.5 5.0  CL 95* 96* 94*  CO2 '26 26 24  '$ GLUCOSE 105* 100* 87  BUN 31* 37* 34*  CREATININE 4.35* 3.74* 5.12*  CALCIUM 9.8 10.0 10.4*  PHOS  --  6.4* 7.5*    GFR: Estimated Creatinine Clearance: 36.4 mL/min (A) (by C-G formula based on SCr of 5.12 mg/dL (H)). Liver Function Tests: Recent Labs  Lab 05/06/21 0901 05/08/21 0643  ALBUMIN 2.8* 3.0*    No results for input(s): LIPASE, AMYLASE in the last 168 hours. No results for input(s): AMMONIA in the last 168 hours. Coagulation Profile: No results for input(s): INR, PROTIME in the last 168 hours.  Cardiac Enzymes: No results for input(s): CKTOTAL, CKMB, CKMBINDEX, TROPONINI in the last 168 hours. BNP (last 3 results) No results for input(s): PROBNP in the last 8760 hours. HbA1C: No results for input(s):  HGBA1C in the last 72 hours. CBG: No results for input(s): GLUCAP in the last 168 hours. Lipid Profile: No results for input(s): CHOL, HDL, LDLCALC, TRIG, CHOLHDL, LDLDIRECT in the last 72 hours. Thyroid Function Tests: No results for input(s): TSH, T4TOTAL, FREET4, T3FREE, THYROIDAB in the last 72 hours. Anemia Panel: No results for input(s): VITAMINB12, FOLATE, FERRITIN, TIBC, IRON, RETICCTPCT in the last 72 hours. Sepsis Labs: No results for input(s): PROCALCITON, LATICACIDVEN in the last 168 hours.  No results found for this or any previous visit (from the past 240 hour(s)).   Radiology Studies: No results found.   Scheduled Meds:  allopurinol  100 mg Oral Daily   amiodarone  200 mg Oral Daily   apixaban  5 mg Oral BID   bisacodyl  10 mg Rectal Once   Chlorhexidine Gluconate Cloth  6 each Topical Daily   [START ON 05/15/2021] darbepoetin (ARANESP) injection - DIALYSIS  100 mcg Intravenous Q Fri-HD    docusate sodium  100 mg Oral BID   lanthanum  1,000 mg Oral TID WC   linaclotide  145 mcg Oral QAC breakfast   midodrine  20 mg Oral TID WC   multivitamin  1 tablet Oral QHS   oxyCODONE  10 mg Oral Q12H   polyethylene glycol  17 g Oral BID   sodium zirconium cyclosilicate  10 g Oral Once   Continuous Infusions:  anticoagulant sodium citrate     anticoagulant sodium citrate      ceFAZolin (ANCEF) IV Stopped (05/09/21 1447)     LOS: 24 days   Time spent: 27 minutes.   Darliss Cheney, MD [facility]  If 7PM-7AM, please contact night-coverage [email or web address]  05/10/2021, 11:59 AM

## 2021-05-11 DIAGNOSIS — B9561 Methicillin susceptible Staphylococcus aureus infection as the cause of diseases classified elsewhere: Secondary | ICD-10-CM | POA: Diagnosis not present

## 2021-05-11 DIAGNOSIS — R7881 Bacteremia: Secondary | ICD-10-CM | POA: Diagnosis not present

## 2021-05-11 LAB — RENAL FUNCTION PANEL
Albumin: 3 g/dL — ABNORMAL LOW (ref 3.5–5.0)
Anion gap: 10 (ref 5–15)
BUN: 39 mg/dL — ABNORMAL HIGH (ref 6–20)
CO2: 25 mmol/L (ref 22–32)
Calcium: 10.2 mg/dL (ref 8.9–10.3)
Chloride: 92 mmol/L — ABNORMAL LOW (ref 98–111)
Creatinine, Ser: 6.06 mg/dL — ABNORMAL HIGH (ref 0.61–1.24)
GFR, Estimated: 12 mL/min — ABNORMAL LOW (ref >60)
Glucose, Bld: 91 mg/dL (ref 70–99)
Phosphorus: 7.6 mg/dL — ABNORMAL HIGH (ref 2.5–4.6)
Potassium: 4.4 mmol/L (ref 3.5–5.1)
Sodium: 127 mmol/L — ABNORMAL LOW (ref 135–145)

## 2021-05-11 LAB — CBC
HCT: 35.4 % — ABNORMAL LOW (ref 39.0–52.0)
Hemoglobin: 11 g/dL — ABNORMAL LOW (ref 13.0–17.0)
MCH: 29.7 pg (ref 26.0–34.0)
MCHC: 31.1 g/dL (ref 30.0–36.0)
MCV: 95.7 fL (ref 80.0–100.0)
Platelets: 371 10*3/uL (ref 150–400)
RBC: 3.7 MIL/uL — ABNORMAL LOW (ref 4.22–5.81)
RDW: 16.7 % — ABNORMAL HIGH (ref 11.5–15.5)
WBC: 6.2 10*3/uL (ref 4.0–10.5)
nRBC: 0 % (ref 0.0–0.2)

## 2021-05-11 LAB — HEPATITIS B E ANTIBODY: Hep B E Ab: NEGATIVE

## 2021-05-11 MED ORDER — ONDANSETRON HCL 4 MG PO TABS: 4.0000 mg | ORAL_TABLET | Freq: Four times a day (QID) | ORAL | Status: DC

## 2021-05-11 MED ORDER — FAMOTIDINE 20 MG PO TABS
20.0000 mg | ORAL_TABLET | Freq: Two times a day (BID) | ORAL | Status: DC
  Administered 2021-05-12 – 2021-05-18 (×2): 20 mg via ORAL
  Filled 2021-05-11 (×13): qty 1

## 2021-05-11 MED ORDER — ONDANSETRON HCL 4 MG/2ML IJ SOLN
4.0000 mg | Freq: Four times a day (QID) | INTRAMUSCULAR | Status: DC | PRN
  Administered 2021-05-11 – 2021-05-19 (×19): 4 mg via INTRAVENOUS
  Filled 2021-05-11 (×19): qty 2

## 2021-05-11 NOTE — Progress Notes (Signed)
PT Cancellation Note  Patient Details Name: Phillip Young MRN: [id number] DOB: 08-13-1985   Cancelled Treatment:    Reason Eval/Treat Not Completed: Patient at procedure or test/unavailable. Pt in HD.   Shary Decamp [facility] 05/11/2021, 10:18 AM [place] Pager [phone] Office [phone]

## 2021-05-11 NOTE — Progress Notes (Signed)
[facility] PROGRESS NOTE  Phillip Young [id number] DOB: Jun 09, 1986 DOA: 04/12/2021 PCP: [name], MD  Status: Remains inpatient appropriate because:  Continued requirement for IV antibiotics to treat bacteremia with osteomyelitis; significant physical deconditioning that will eventually require SNF for rehab; inpatient hemodialysis-once able to tolerate dialysis in chair will be candidate for outpatient hemodialysis  Barriers to discharge:  Continued requirement for IV antibiotic, SNF bed availability for superobese patient (BMI 64.6), patient lived alone prior to admission and must be able to navigate 6 steps before eventual discharge back to home  Level of care:  Telemetry Cardiac   Code Status: Full Family Communication: Patient only DVT prophylaxis: Eliquis COVID vaccination status: COVID + January 7042   HPI: 35 year old with past medical history significant for severe biventricular systolic heart failure with known ejection fraction less than 20%, diabetes type 2, paroxysmal a flutter on Eliquis, recently started on hemodialysis with recent extensive hospitalization with cardiogenic shock and cardiorenal syndrome progressed to ESRD presented back to the emergency room with shortness of breath, left wrist and right knee pain, unable to ambulate.  Home oxygen concentrator not working.  Chest x-ray showed pulmonary edema, COVID-19 negative.  Patient was found to have MSSA bacteremia due to infected dialysis catheter.  ID and nephrology consulted.  After catheter holiday he received new permanent cath on 9/28 and restarted on hemodialysis.  Subjective: Patient awake.  Complaining of chronic nausea that was present prior to admission.  Has questions regarding eligibility for bariatric surgery.  Redirected him to the [place] bariatric surgery website.  Patient has smart phone and is able to access while in the hospital.  Objective: Vitals:   05/10/21 2034  05/11/21 0002  BP: 103/67 119/72  Pulse: 76 80  Resp: 20 17  Temp: 98.2 F (36.8 C) 98.7 F (37.1 C)  SpO2: 99% 100%    Intake/Output Summary (Last 24 hours) at 05/11/2021 0726 Last data filed at 05/10/2021 1800 Gross per 24 hour  Intake 480 ml  Output --  Net 480 ml   Filed Weights   05/08/21 0729  Weight: (!) 210 kg    Exam:  Constitutional: NAD, calm, comfortable except for ongoing chronic nausea Respiratory: clear to auscultation bilaterally, no wheezing, no crackles. Normal respiratory effort. No accessory muscle use.  3 L nasal cannula oxygen with O2 sats between 96 and 100% Cardiovascular: Regular rate and rhythm, no murmurs / rubs / gallops. No extremity edema.  Remains normotensive Abdomen: no tenderness, no masses palpated.  Bowel sounds positive. LBM 10/14 Neurologic: CN 2-12 grossly intact. Sensation intact, DTR normal. Strength 4-5/5 x all 4 extremities.  Psychiatric: Normal judgment and insight. Alert and oriented x 3. Normal mood.    Assessment/Plan: Acute problems:   1-MSSA Bacteremia/Left SI septic arthritis, sacrum and iliac bone osteomyelitis/abscess left iliacus muscle, small abscess left piriformis muscle:  Imaging revealed changes consistent with osteomyelitis and abscess formation; blood cultures positive for MSSA-follow-up blood cultures negative TTE without evidence of vegetation-TEE on 10/11 negative for endocarditis Perm cath source of infection and was removed.  ID recommended IV antibiotics for 6 weeks. (LD Nov 6th).  Continue scheduled oxycontin along with Oxy IR prn. Ortho team evaluated and determined he was too high risk for surgery.  Per IR, abscess is small and it is in a location that may not be amenable for drainage, for this reason and to avoid to give more contrast to patient to prevent worsening to any residual renal function he has left won't proceed  with CT scan or procedure. Will require follow-up imaging prior to  discharge Continue PT and OT   Acute on chronic respiratory failure with hypoxemia and hypercapnia/OSA with suspected OHS:  Suspected due to malfunctioning concentrator at home-DME will need to be checked before discharge back to home Component of volume overload as well that is currently being managed with HD Currently on baseline oxygen requirement. Has documented OSA but but unable to use BiPAP due to cost of equipment-continue nasal cannula oxygen for now-we will need to clarify if patient has BiPAP at home or if we can obtain more affordable option   ESRD on hemodialysis: Tolerating dialysis through TDC-has not yet had permanent dialysis access placed Was evaluated by palliative care, patient agreed to have hemodialysis and further treatments.    Biventricular heart failure with associated and ICM, paroxysmal A. fib, hypotension: Known and ICM since 2019 and initially followed at [facility] EF < 20%.  Given BMI not a candidate for ICD Continue midodrine, Eliquis-volume management with HD  Chronic nausea 10/17 begin scheduled oral Zofran with H2 blocker  Paroxysmal atrial fibrillation Continue amiodarone   Morbid obesity:  BMI more than 50 with most recent reading 73.18 On Semaglutide for weight reduction.  Current hemoglobin A1c 6.5 Patient is interested in pursuing bariatric surgery option once medically stable   Constipation Continue Colace, Linzess and MiraLAX Likely narcotic induced   Stage I decubitus ulcer at the medial left gluteal fold:  Wound care per nursing protocol      Data Reviewed: Basic Metabolic Panel: Recent Labs  Lab 05/06/21 0901 05/08/21 0643  NA 133* 130*  K 4.5 5.0  CL 96* 94*  CO2 26 24  GLUCOSE 100* 87  BUN 37* 34*  CREATININE 3.74* 5.12*  CALCIUM 10.0 10.4*  PHOS 6.4* 7.5*   Liver Function Tests: Recent Labs  Lab 05/06/21 0901 05/08/21 0643  ALBUMIN 2.8* 3.0*   No results for input(s): LIPASE, AMYLASE in the last 168 hours. No  results for input(s): AMMONIA in the last 168 hours. CBC: Recent Labs  Lab 05/06/21 0901 05/08/21 0643  WBC 7.4 6.6  HGB 10.1* 11.0*  HCT 33.4* 34.7*  MCV 98.5 95.3  PLT 399 351   Cardiac Enzymes: No results for input(s): CKTOTAL, CKMB, CKMBINDEX, TROPONINI in the last 168 hours. BNP (last 3 results) Recent Labs    11/07/20 0932 02/11/21 1500 04/12/21 1555  BNP 106.0* 194.3* 658.4*    ProBNP (last 3 results) No results for input(s): PROBNP in the last 8760 hours.  CBG: No results for input(s): GLUCAP in the last 168 hours.  No results found for this or any previous visit (from the past 240 hour(s)).   Studies: No results found.  Scheduled Meds:  allopurinol  100 mg Oral Daily   amiodarone  200 mg Oral Daily   apixaban  5 mg Oral BID   bisacodyl  10 mg Rectal Once   Chlorhexidine Gluconate Cloth  6 each Topical Daily   [START ON 05/15/2021] darbepoetin (ARANESP) injection - DIALYSIS  100 mcg Intravenous Q Fri-HD   docusate sodium  100 mg Oral BID   lanthanum  1,000 mg Oral TID WC   linaclotide  145 mcg Oral QAC breakfast   midodrine  20 mg Oral TID WC   multivitamin  1 tablet Oral QHS   oxyCODONE  10 mg Oral Q12H   polyethylene glycol  17 g Oral BID   sodium zirconium cyclosilicate  10 g Oral Once   Continuous  Infusions:  anticoagulant sodium citrate     anticoagulant sodium citrate      ceFAZolin (ANCEF) IV Stopped (05/10/21 1455)    Principal Problem:   MSSA bacteremia Active Problems:   Morbid obesity ([place])   GERD without esophagitis   OSA (obstructive sleep apnea)   Chronic systolic CHF (congestive heart failure) ([name])   Paroxysmal atrial flutter ([name])   Hyponatremia   ESRD (end stage renal disease) on dialysis ([place])   Acute on chronic respiratory failure with hypoxia ([place])   Medical non-compliance   Hypotension   ESRD on dialysis [facility])   Palliative care by specialist   Full code status   Concern about end of  life   Consultants: Nephrology Cardiology Orthopedics Pulmonary medicine Infectious disease Interventional radiology  Procedures: TTE TEE Placement [facility]  Antibiotics: Cefazolin 9/23 >>   Time spent: 35 minutes    Erin Hearing ANP  [facility] 7 am - 330 pm/M-F for direct patient care and secure chat Please refer to Amion for contact info 25  days

## 2021-05-11 NOTE — Progress Notes (Signed)
[facility] Progress Note   Subjective:   Seen on HD, no new c/o's today  Objective Vitals:   05/11/21 0930 05/11/21 1000 05/11/21 1030 05/11/21 1100  BP: 117/72 127/73 128/78 (!) 108/59  Pulse: 74 74 83 80  Resp: (!) '22  15 13  '$ Temp:      TempSrc:      SpO2:      Weight:      Height:       Physical Exam General: Obese man, NAD. Nasal O2 in place Heart: RRR; no murmur Lungs: CTA anteriorly Abdomen: soft Extremities: Trace LE edema Dialysis Access: [facility]  Dialysis Orders: TTS/ AKI [facility] 4h  400/800    188.5kg   2/2 bath  TDC RIJ  Hep none  - no in center meds   Assessment/Plan: MSSA bacteremia: Blood Cx 9/22 + and catheter exit site purulence. S/p [facility] removal/line holiday 9/24 -> 9/28 new TDC placed. Has assoc left-sided SI joint septic athritis w/ local osteo and 2 associated small abscesses. Per ortho too high risk for surgical Rx. Per IR abscesses are small, would need IV contrast CT to attempt drain and even with that they may not be amenable to drainage; so drainage is not being pursued. On IV Cefazolin x 6 week course (thru 11/5). S/p TEE 10/11-neg for endocarditis. Per primary service BP/volume: Chronic hypotension on mido '20mg'$  TID, volume overall improving despite weight going up this admit. UF as tolerated with HD. ESRD: Usual TTS schedule - intermittently refusing. Dialyzed Wed/Fri last week, refused on Saturday. Planning HD today, then Thurs/ Sat this week. Biventricular congestive heart failure: Not a candidate for LVAD or transplant, per notes. Atrial fibrillation: On amiodarone + Eliquis. CKD MBD: Ca/Phos both high. On Fosrenol as binder -> refuses often. PTH low (42). Hypercalcemia possibly due to immobility and osteo. Consider  additional hyperCa work-up if rises further. 2Ca bath with HD for now. Anemia of chronic kidney disease: Hgb 11,  reducing weekly Aranesp dose from 150 to 100.  Kelly Splinter, MD 05/11/2021, 11:08  AM    Additional Objective Labs: Basic Metabolic Panel: Recent Labs  Lab 05/06/21 0901 05/08/21 0643 05/11/21 0641  NA 133* 130* 127*  K 4.5 5.0 4.4  CL 96* 94* 92*  CO2 '26 24 25  '$ GLUCOSE 100* 87 91  BUN 37* 34* 39*  CREATININE 3.74* 5.12* 6.06*  CALCIUM 10.0 10.4* 10.2  PHOS 6.4* 7.5* 7.6*    Liver Function Tests: Recent Labs  Lab 05/06/21 0901 05/08/21 0643 05/11/21 0641  ALBUMIN 2.8* 3.0* 3.0*    CBC: Recent Labs  Lab 05/06/21 0901 05/08/21 0643 05/11/21 0641  WBC 7.4 6.6 6.2  HGB 10.1* 11.0* 11.0*  HCT 33.4* 34.7* 35.4*  MCV 98.5 95.3 95.7  PLT 399 351 371    Medications:  anticoagulant sodium citrate     anticoagulant sodium citrate      ceFAZolin (ANCEF) IV Stopped (05/10/21 1455)    allopurinol  100 mg Oral Daily   amiodarone  200 mg Oral Daily   apixaban  5 mg Oral BID   bisacodyl  10 mg Rectal Once   Chlorhexidine Gluconate Cloth  6 each Topical Daily   [START ON 05/15/2021] darbepoetin (ARANESP) injection - DIALYSIS  100 mcg Intravenous Q Fri-HD   docusate sodium  100 mg Oral BID   famotidine  20 mg Oral BID   lanthanum  1,000 mg Oral TID WC   linaclotide  145 mcg Oral QAC breakfast  midodrine  20 mg Oral TID WC   multivitamin  1 tablet Oral QHS   ondansetron  4 mg Oral Q6H   oxyCODONE  10 mg Oral Q12H   polyethylene glycol  17 g Oral BID   sodium zirconium cyclosilicate  10 g Oral Once

## 2021-05-11 NOTE — Progress Notes (Addendum)
CSW spoke with [name] at [facility] who states the facility cannot accommodate this patient's needs. [facility] admissions are still on hold at this time.  CSW spoke with Anderson Malta at [facility]	[place] who confirms she is following the patient to possibly offer a bed.  Madilyn Fireman, MSW, LCSW Transitions of Care  Clinical Social Worker II [phone]

## 2021-05-11 NOTE — Progress Notes (Signed)
PT Cancellation Note  Patient Details Name: Phillip Young MRN: [id number] DOB: 16-Jun-1986   Cancelled Treatment:    Reason Eval/Treat Not Completed: Patient at procedure or test/unavailable. Pt in HD.   Shary Decamp [facility] 05/11/2021, 12:43 PM [place] Pager [phone] Office [phone]

## 2021-05-11 NOTE — TOC Progression Note (Addendum)
Transition of Care [facility]) - Progression Note    Patient Details  Name: Phillip Young MRN: [id number] Date of Birth: 1986/04/20  Transition of Care [facility]) CM/SW [place], RN Phone Number: 05/11/2021, 9:21 AM  Clinical Narrative:    CM called and left a message with Wardell Honour, CM with [facility] to have her review the patient for potential admission.  H[place], CM states that she will continue to follow the patient.  At this time, the patient may not be able to tolerate CIR due to fatigue and low EF per [place], CM.  I also added Di Kindle, [place] facility and [facility] skilled nursing facilities in the hub to review as well - which includes updated PT notes for review.  CM and MSW with DTP will continue to follow the patient for exploration of rehabilitation beds.  05/11/2021 1331- Hoyle Sauer, CM with [facility] will continue to follow the patient at this time.  The patient was a previous patient of the company but at this time the patient does not have an available family member to assist with patient care at the home.  CM will continue to follow the patient for SNF/LTAc placement.  I asked Joannie [place], CM with [facility] to review the patient for possible admission.   Expected Discharge Plan: [place] Barriers to Discharge: Continued Medical Work up  Expected Discharge Plan and Services Expected Discharge Plan: [place] In-house Referral: Clinical Social Work Discharge Planning Services: CM Consult Post Acute Care Choice: [place] arrangements for the past 2 months: [place]                   DME Agency: [employer] Date DME Agency Contacted: 04/14/21 Time DME Agency Contacted: [phone] Representative spoke with at DME Agency: Andree Coss             Social Determinants of Health (SDOH) Interventions Food Insecurity Interventions: Assist with SNAP  Application Financial Strain Interventions: Other (Comment) (Referral to [facility]) Housing Interventions: Intervention Not Indicated Transportation Interventions: Intervention Not Indicated  Readmission Risk Interventions Readmission Risk Prevention Plan 10/16/2020 10/16/2020 10/14/2020  Transportation Screening Complete Complete Complete  PCP or Specialist Appt within 5-7 Days Complete Complete Complete  Home Care Screening Complete - -  Medication Review (RN CM) Referral to Pharmacy - -  Some recent data might be hidden

## 2021-05-11 NOTE — Progress Notes (Signed)
UF off. 100 of normal saline given. Bp rechecked.

## 2021-05-11 NOTE — Progress Notes (Signed)
UF remains off. Patient with no complaints of dizziness or cramping.

## 2021-05-11 NOTE — Progress Notes (Signed)
UF back on . Patient alert.

## 2021-05-12 ENCOUNTER — Telehealth (HOSPITAL_COMMUNITY): Payer: Self-pay

## 2021-05-12 DIAGNOSIS — R7881 Bacteremia: Secondary | ICD-10-CM | POA: Diagnosis not present

## 2021-05-12 DIAGNOSIS — J90 Pleural effusion, not elsewhere classified: Secondary | ICD-10-CM

## 2021-05-12 DIAGNOSIS — B9561 Methicillin susceptible Staphylococcus aureus infection as the cause of diseases classified elsewhere: Secondary | ICD-10-CM | POA: Diagnosis not present

## 2021-05-12 DIAGNOSIS — R0989 Other specified symptoms and signs involving the circulatory and respiratory systems: Secondary | ICD-10-CM

## 2021-05-12 MED ORDER — LACTULOSE 10 GM/15ML PO SOLN
10.0000 g | Freq: Every day | ORAL | Status: DC
  Administered 2021-05-18: 10 g via ORAL
  Filled 2021-05-12 (×2): qty 15

## 2021-05-12 MED ORDER — AMITRIPTYLINE HCL 10 MG PO TABS
10.0000 mg | ORAL_TABLET | Freq: Every day | ORAL | Status: DC
  Administered 2021-05-12 – 2021-05-18 (×5): 10 mg via ORAL
  Filled 2021-05-12 (×9): qty 1

## 2021-05-12 NOTE — Progress Notes (Signed)
[facility] PROGRESS NOTE  Phillip Young [id number] DOB: [birth year] DOA: 04/12/2021 PCP: [name], MD  Status: Remains inpatient appropriate because:  Continued requirement for IV antibiotics to treat bacteremia with osteomyelitis; significant physical deconditioning that will eventually require SNF for rehab; inpatient hemodialysis-once able to tolerate dialysis in chair will be candidate for outpatient hemodialysis  Barriers to discharge:  Continued requirement for IV antibiotic, SNF bed availability for superobese patient (BMI 64.6), patient lived alone prior to admission and must be able to navigate 6 steps before eventual discharge back to home  Level of care:  Telemetry Cardiac   Code Status: Full Family Communication: Patient only DVT prophylaxis: Eliquis COVID vaccination status: COVID + January 608   HPI: 35 year old with past medical history significant for severe biventricular systolic heart failure with known ejection fraction less than 20%, diabetes type 2, paroxysmal a flutter on Eliquis, recently started on hemodialysis with recent extensive hospitalization with cardiogenic shock and cardiorenal syndrome progressed to ESRD presented back to the emergency room with shortness of breath, left wrist and right knee pain, unable to ambulate.  Home oxygen concentrator not working.  Chest x-ray showed pulmonary edema, COVID-19 negative.  Patient was found to have MSSA bacteremia due to infected dialysis catheter.  ID and nephrology consulted.  After catheter holiday he received new permanent cath on 9/28 and restarted on hemodialysis.  Subjective: Continues to report issues with ongoing nausea and vomiting although somewhat better with the addition of scheduled Zofran-patient very excited that he has lost a significant amount of weight this hospitalization..  Told him that likely quite a bit of was related to volume overload in context of recently evolving  chronic kidney disease now being managed with dialysis.  He is eager to begin weight management strategies.  Objective: Vitals:   05/12/21 0413 05/12/21 0708  BP:  101/84  Pulse:    Resp: 17 16  Temp:  98.2 F (36.8 C)  SpO2:  96%    Intake/Output Summary (Last 24 hours) at 05/12/2021 0725 Last data filed at 05/11/2021 1500 Gross per 24 hour  Intake 480 ml  Output 3615 ml  Net -3135 ml   Filed Weights   05/08/21 0729 05/11/21 0902  Weight: (!) 210 kg (!) 238 kg    Exam:  Constitutional: NAD, calm, dated Respiratory: Anterior lung sounds are clear to auscultation normal respiratory effort. No accessory muscle use.  3 L nasal cannula oxygen with O2 sats between 96 and 100% Cardiovascular: Regular rate and rhythm, S1-S2, no extremity edema.  Remains normotensive Abdomen: no tenderness, no masses palpated.  Bowel sounds positive. LBM 10/14 Neurologic: CN 2-12 grossly intact. Sensation intact, DTR normal. Strength 4-5/5 x all 4 extremities.  Psychiatric: Normal judgment and insight. Alert and oriented x 3. Normal mood.    Assessment/Plan: Acute problems:   MSSA Bacteremia/Left SI septic arthritis, sacrum and iliac bone osteomyelitis/abscess left iliacus muscle, small abscess left piriformis muscle:  Imaging + for osteomyelitis and abscess formation; blood cultures positive for MSSA-follow-up blood cultures negative TTE: no vegetation-TEE: no endocarditis Perm cath (source of infection) -removed.  ID recommended IV antibiotics for 6 weeks. (LD Nov 6th).  Discussed with nephrologist who recommended IJ PICC placement since skilled nursing facilities will not accept PIV. Continue scheduled oxycontin along with Oxy IR prn. Ortho team evaluated -too high risk for surgery.  Per IR, abscess small and likely not be amenable for drainage, for this reason and to avoid to give more contrast to patient to prevent worsening  to any residual renal function he has left won't proceed with CT  scan or procedure. Will require follow-up imaging prior to discharge   Acute on chronic respiratory failure with hypoxemia and hypercapnia/OSA with suspected OHS:  Suspected due to malfunctioning concentrator at home-DME will need to be checked before discharge back to home Component of volume overload as well that is currently being managed with HD Currently on baseline oxygen requirement. Has documented OSA on nocturnal BiPAP with BiPAP/Trilogy machine at home on the   ESRD on hemodialysis: Tolerating dialysis through TDC-has not yet had permanent dialysis access placed Was evaluated by palliative care, patient agreed to have hemodialysis and further treatments.    Biventricular heart failure with associated and ICM, paroxysmal A. fib, hypotension: Known and ICM since 2019 and initially followed at [facility] EF 25 to 30% recent TTE with EF on TEE 20%.  Given BMI not a candidate for ICD Continue midodrine, Eliquis-volume management with HD  Chronic nausea 10/17 begin scheduled oral Zofran with H2 blocker 10/18 continues to report nausea.  Low-dose Elavil 10 mg at HS EF 25 to 30% on 9/24  (TEE EF was 20% on 10/11 )as well as current medications of amiodarone, Zofran and now Elavil we will check EKG today regarding QTC and check every 2 to 3 days while hospitalized.  Last EKG 9/28 QTC was 468 ms  Paroxysmal atrial fibrillation Continue amiodarone   Morbid obesity:  BMI more than 50 with most recent reading 73.18 On Semaglutide for weight reduction.  Current hemoglobin A1c 6.5 Patient is interested in pursuing bariatric surgery option once medically stable   Narcotic induced constipation Continue Colace, Linzess and MiraLAX Will add scheduled lactulose at bedtime   Stage I decubitus ulcer at the medial left gluteal fold:  Wound care per nursing protocol      Data Reviewed: Basic Metabolic Panel: Recent Labs  Lab 05/06/21 0901 05/08/21 0643 05/11/21 0641  NA 133* 130* 127*   K 4.5 5.0 4.4  CL 96* 94* 92*  CO2 '26 24 25  '$ GLUCOSE 100* 87 91  BUN 37* 34* 39*  CREATININE 3.74* 5.12* 6.06*  CALCIUM 10.0 10.4* 10.2  PHOS 6.4* 7.5* 7.6*   Liver Function Tests: Recent Labs  Lab 05/06/21 0901 05/08/21 0643 05/11/21 0641  ALBUMIN 2.8* 3.0* 3.0*   No results for input(s): LIPASE, AMYLASE in the last 168 hours. No results for input(s): AMMONIA in the last 168 hours. CBC: Recent Labs  Lab 05/06/21 0901 05/08/21 0643 05/11/21 0641  WBC 7.4 6.6 6.2  HGB 10.1* 11.0* 11.0*  HCT 33.4* 34.7* 35.4*  MCV 98.5 95.3 95.7  PLT 399 351 371   Cardiac Enzymes: No results for input(s): CKTOTAL, CKMB, CKMBINDEX, TROPONINI in the last 168 hours. BNP (last 3 results) Recent Labs    11/07/20 0932 02/11/21 1500 04/12/21 1555  BNP 106.0* 194.3* 658.4*    ProBNP (last 3 results) No results for input(s): PROBNP in the last 8760 hours.  CBG: No results for input(s): GLUCAP in the last 168 hours.  No results found for this or any previous visit (from the past 240 hour(s)).   Studies: No results found.  Scheduled Meds:  allopurinol  100 mg Oral Daily   amiodarone  200 mg Oral Daily   apixaban  5 mg Oral BID   bisacodyl  10 mg Rectal Once   Chlorhexidine Gluconate Cloth  6 each Topical Daily   [START ON 05/15/2021] darbepoetin (ARANESP) injection - DIALYSIS  100 mcg Intravenous  Q Fri-HD   docusate sodium  100 mg Oral BID   famotidine  20 mg Oral BID   lanthanum  1,000 mg Oral TID WC   linaclotide  145 mcg Oral QAC breakfast   midodrine  20 mg Oral TID WC   multivitamin  1 tablet Oral QHS   oxyCODONE  10 mg Oral Q12H   polyethylene glycol  17 g Oral BID   sodium zirconium cyclosilicate  10 g Oral Once   Continuous Infusions:  anticoagulant sodium citrate     anticoagulant sodium citrate      ceFAZolin (ANCEF) IV 1 g (05/11/21 1831)    Principal Problem:   MSSA bacteremia Active Problems:   Morbid obesity ([place])   GERD without esophagitis   OSA  (obstructive sleep apnea)   Chronic systolic CHF (congestive heart failure) ([name])   Paroxysmal atrial flutter ([name])   Hyponatremia   ESRD (end stage renal disease) on dialysis ([place])   Acute on chronic respiratory failure with hypoxia ([place])   Medical non-compliance   Hypotension   ESRD on dialysis [facility])   Palliative care by specialist   Full code status   Concern about end of life   Consultants: Nephrology Cardiology Orthopedics Pulmonary medicine Infectious disease Interventional radiology  Procedures: TTE TEE Placement [facility]  Antibiotics: Cefazolin 9/23 >>   Time spent: 35 minutes    Erin Hearing ANP  [facility] 7 am - 330 pm/M-F for direct patient care and secure chat Please refer to Amion for contact info 26  days

## 2021-05-12 NOTE — Progress Notes (Signed)
Occupational Therapy Treatment Patient Details Name: Phillip Young MRN: [id number] DOB: 11-02-1985 Today's Date: 05/12/2021   History of present illness Pt is a 35 y.o. male admitted 04/12/21 with SOB, hypoxia. Workup for MSSA bacteremia, acute hypoxic respiratory failure, cardiorenal syndrome. Pt had catheter holiday with clearance of bacteremia; pt declined reinsertion of HD catheter 9/27. Course complicated by R knee and L hip pain; pelvic CT negative for acute abnormality. Of note, recent admission 02/11/21-03/26/21 with cardiogenic shock, biventricular HF, progression to ESRD with need for HD. Other PMH includes CHF, DM2, aflutter, ESRD (HD TTS).   OT comments  Pt is slowly progressing towards OT goals. Pt continues to be limited by pain, activity tolerance, and balance. During session, pt completed toilet transfer to [facility] with Min A for safety/cues using RW. Pt also completed toilet hygiene in standing position with supervision for safety. Pt continues to be appropriate for SNF at d/c if bed becomes available. If d/c'd home, will likely need HHOT. Will continue to follow acutely.   Recommendations for follow up therapy are one component of a multi-disciplinary discharge planning process, led by the attending physician.  Recommendations may be updated based on patient status, additional functional criteria and insurance authorization.    Follow Up Recommendations  SNF    Equipment Recommendations  3 in 1 bedside commode    Recommendations for Other Services      Precautions / Restrictions Precautions Precautions: Fall Precaution Comments: Watch SpO2 (wears 1L O2 baseline) Restrictions Weight Bearing Restrictions: No       Mobility Bed Mobility Overal bed mobility: Needs Assistance Bed Mobility: Supine to Sit     Supine to sit: Min guard;HOB elevated          Transfers Overall transfer level: Needs assistance Equipment used: Rolling walker (2 wheeled) Transfers: Sit  to/from Stand Sit to Stand: Min guard              Balance Overall balance assessment: Needs assistance Sitting-balance support: Feet supported Sitting balance-Leahy Scale: Good     Standing balance support: Single extremity supported;No upper extremity supported;During functional activity Standing balance-Leahy Scale: Fair                             ADL either performed or assessed with clinical judgement   ADL Overall ADL's : Needs assistance/impaired                         Toilet Transfer: Minimal assistance;Ambulation;BSC;RW;Cueing for safety;Cueing for sequencing   Toileting- Clothing Manipulation and Hygiene: Supervision/safety;Sit to/from stand       Functional mobility during ADLs: Min guard;Rolling walker;Cueing for safety;Cueing for sequencing General ADL Comments: Pt limited by pain, balance, and activity tolerance     Vision       Perception     Praxis      Cognition Arousal/Alertness: Awake/alert Behavior During Therapy: WFL for tasks assessed/performed Overall Cognitive Status: Within Functional Limits for tasks assessed                                          Exercises     Shoulder Instructions       General Comments      Pertinent Vitals/ Pain       Pain Assessment: 0-10 Pain Score: 8  Pain Location: left  hip, right knee Pain Descriptors / Indicators: Grimacing;Moaning;Guarding;Constant;Discomfort;Sore Pain Intervention(s): Limited activity within patient's tolerance;Monitored during session;Repositioned  Home Living                                          Prior Functioning/Environment              Frequency  Min 2X/week        Progress Toward Goals  OT Goals(current goals can now be found in the care plan section)  Progress towards OT goals: Progressing toward goals  Acute Rehab OT Goals Patient Stated Goal: return home OT Goal Formulation: With  patient Time For Goal Achievement: 05/23/21 Potential to Achieve Goals: Good ADL Goals Pt Will Perform Lower Body Dressing: with set-up;sitting/lateral leans;sit to/from stand  Plan Discharge plan remains appropriate;Frequency remains appropriate    Co-evaluation                 AM-PAC OT "6 Clicks" Daily Activity     Outcome Measure   Help from another person eating meals?: None Help from another person taking care of personal grooming?: A Little Help from another person toileting, which includes using toliet, bedpan, or urinal?: A Little Help from another person bathing (including washing, rinsing, drying)?: A Lot Help from another person to put on and taking off regular upper body clothing?: A Little Help from another person to put on and taking off regular lower body clothing?: A Lot 6 Click Score: 17    End of Session Equipment Utilized During Treatment: Gait belt;Rolling walker  OT Visit Diagnosis: Unsteadiness on feet (R26.81);Other abnormalities of gait and mobility (R26.89);Muscle weakness (generalized) (M62.81);Pain Pain - Right/Left: Left Pain - part of body: Hip   Activity Tolerance Patient limited by pain;Patient limited by fatigue   Patient Left in bed;with call bell/phone within reach   Nurse Communication Mobility status        Time: [id number] OT Time Calculation (min): 43 min  Charges: OT General Charges $OT Visit: 1 Visit OT Treatments $Self Care/Home Management : 23-37 mins $Therapeutic Activity: 8-22 mins  Rhodie Cienfuegos C, OT/L  Acute Rehab [place] 05/12/2021, 3:31 PM

## 2021-05-12 NOTE — Progress Notes (Signed)
[facility] Progress Note   Subjective:   Seen on HD, no new c/o's today  Objective Vitals:   05/12/21 0300 05/12/21 0413 05/12/21 0708 05/12/21 1144  BP:   101/84 103/85  Pulse:      Resp: '20 17 16 19  '$ Temp:   98.2 F (36.8 C) 98.7 F (37.1 C)  TempSrc:   Oral Oral  SpO2:   96% 98%  Weight:      Height:       Physical Exam General: Obese man, NAD. Nasal O2 in place Heart: RRR; no murmur Lungs: CTA anteriorly Abdomen: soft Extremities: Trace LE edema Dialysis Access: [facility]  Dialysis Orders: TTS/ AKI [facility] 4h  400/800    188.5kg   2/2 bath  TDC RIJ  Hep none  - no in center meds   Assessment/Plan: MSSA bacteremia: Blood Cx 9/22 + and catheter exit site purulence. S/p [facility] removal/line holiday 9/24 -> 9/28 new TDC placed. Has assoc left-sided SI joint septic athritis w/ local osteo and 2 associated small abscesses. Per ortho too high risk for surgical Rx. Per IR abscesses are small, would need IV contrast CT to attempt drain and even with that they may not be amenable to drainage; so drainage is not being pursued.  S/p TEE 10/11-neg for endocarditis. On IV Cefazolin x 6 week course (thru 11/5). Per primary service BP/volume: Chronic hypotension on mido '20mg'$  TID, volume overall improved while here.  Current wt's are off, try for standing weight. UF 3- 4 L each HD as tolerated here ESRD: Usual TTS schedule - intermittently refusing. Dialyzed Wed/Fri last week, refused on Saturday. Had HD yest/ Monday. Next HD Thursday. Biventricular congestive heart failure: Not a candidate for LVAD or transplant, per notes. Atrial fibrillation: On amiodarone + Eliquis. CKD MBD: Ca/Phos both high. On Fosrenol as binder -> refuses often. PTH low (42). Hypercalcemia possibly due to immobility and osteo. Consider  additional hyperCa work-up if rises further. 2Ca bath with HD for now. Anemia of chronic kidney disease: Hgb 11,  reducing weekly Aranesp dose from 150 to 100.  Kelly Splinter, MD 05/11/2021, 11:08 AM    Additional Objective Labs: Basic Metabolic Panel: Recent Labs  Lab 05/06/21 0901 05/08/21 0643 05/11/21 0641  NA 133* 130* 127*  K 4.5 5.0 4.4  CL 96* 94* 92*  CO2 '26 24 25  '$ GLUCOSE 100* 87 91  BUN 37* 34* 39*  CREATININE 3.74* 5.12* 6.06*  CALCIUM 10.0 10.4* 10.2  PHOS 6.4* 7.5* 7.6*    Liver Function Tests: Recent Labs  Lab 05/06/21 0901 05/08/21 0643 05/11/21 0641  ALBUMIN 2.8* 3.0* 3.0*    CBC: Recent Labs  Lab 05/06/21 0901 05/08/21 0643 05/11/21 0641  WBC 7.4 6.6 6.2  HGB 10.1* 11.0* 11.0*  HCT 33.4* 34.7* 35.4*  MCV 98.5 95.3 95.7  PLT 399 351 371    Medications:  anticoagulant sodium citrate     anticoagulant sodium citrate      ceFAZolin (ANCEF) IV 1 g (05/11/21 1831)    allopurinol  100 mg Oral Daily   amiodarone  200 mg Oral Daily   amitriptyline  10 mg Oral QHS   apixaban  5 mg Oral BID   bisacodyl  10 mg Rectal Once   Chlorhexidine Gluconate Cloth  6 each Topical Daily   [START ON 05/15/2021] darbepoetin (ARANESP) injection - DIALYSIS  100 mcg Intravenous Q Fri-HD   docusate sodium  100 mg Oral BID   famotidine  20  mg Oral BID   lactulose  10 g Oral QHS   lanthanum  1,000 mg Oral TID WC   linaclotide  145 mcg Oral QAC breakfast   midodrine  20 mg Oral TID WC   multivitamin  1 tablet Oral QHS   oxyCODONE  10 mg Oral Q12H   polyethylene glycol  17 g Oral BID   sodium zirconium cyclosilicate  10 g Oral Once

## 2021-05-12 NOTE — Progress Notes (Addendum)
Physical Therapy Treatment Patient Details Name: Phillip Young MRN: [id number] DOB: 1985/08/20 Today's Date: 05/12/2021   History of Present Illness Pt is a 35 y.o. male admitted 04/12/21 with SOB, hypoxia. Workup for MSSA bacteremia, acute hypoxic respiratory failure, cardiorenal syndrome. Pt had catheter holiday with clearance of bacteremia; pt declined reinsertion of HD catheter 9/27. Course complicated by R knee and L hip pain; pelvic CT negative for acute abnormality. Of note, recent admission 02/11/21-03/26/21 with cardiogenic shock, biventricular HF, progression to ESRD with need for HD. Other PMH includes CHF, DM2, aflutter, ESRD (HD TTS).    PT Comments    Pt pleasant and agreeable to PT session. Pt with slow, steady progress. Able to step forward 1 step onto standing scale. Pt continues with left hip pain that makes it difficult to advance LLE and also to fully weight bear on LLE in order to advance RLE. Continue to work toward increasing mobility.    Recommendations for follow up therapy are one component of a multi-disciplinary discharge planning process, led by the attending physician.  Recommendations may be updated based on patient status, additional functional criteria and insurance authorization.  Follow Up Recommendations  Other (comment);SNF (pt difficult to place)     Equipment Recommendations  None recommended by PT    Recommendations for Other Services       Precautions / Restrictions Precautions Precautions: Fall Precaution Comments: Watch SpO2 (wears 1L O2 baseline) Restrictions Weight Bearing Restrictions: No     Mobility  Bed Mobility            General bed mobility comments: Pt sitting up on EOB    Transfers Overall transfer level: Needs assistance Equipment used:  (standing scale) Transfers: Sit to/from Stand Sit to Stand: Min guard         General transfer comment: Incr time and effort but assist only for  safety  Ambulation/Gait Ambulation/Gait assistance: Min guard Gait Distance (Feet): 1 Feet Assistive device:  (holding onto standing scale) Gait Pattern/deviations: Step-to pattern     General Gait Details: From standing at EOB pt able to step forward onto standing scale using rails of scale. Then able to step backwards off of scale   Stairs             Wheelchair Mobility    Modified Rankin (Stroke Patients Only)       Balance Overall balance assessment: Needs assistance Sitting-balance support: Feet supported;No upper extremity supported Sitting balance-Leahy Scale: Good     Standing balance support: No upper extremity supported Standing balance-Leahy Scale: Fair Standing balance comment: static standing without UE support                            Cognition Arousal/Alertness: Awake/alert Behavior During Therapy: WFL for tasks assessed/performed Overall Cognitive Status: Within Functional Limits for tasks assessed                                        Exercises      General Comments        Pertinent Vitals/Pain Pain Assessment: 0-10 Pain Score: 8  Pain Location: left hip, right knee Pain Descriptors / Indicators: Grimacing;Guarding;Moaning;Constant Pain Intervention(s): Limited activity within patient's tolerance;Monitored during session;Repositioned    Home Living  Prior Function            PT Goals (current goals can now be found in the care plan section) Acute Rehab PT Goals Patient Stated Goal: return home Progress towards PT goals: Progressing toward goals    Frequency    Min 3X/week      PT Plan Current plan remains appropriate    Co-evaluation              AM-PAC PT "6 Clicks" Mobility   Outcome Measure  Help needed turning from your back to your side while in a flat bed without using bedrails?: A Little Help needed moving from lying on your back to sitting on  the side of a flat bed without using bedrails?: A Little Help needed moving to and from a bed to a chair (including a wheelchair)?: A Little Help needed standing up from a chair using your arms (e.g., wheelchair or bedside chair)?: A Little Help needed to walk in hospital room?: A Lot Help needed climbing 3-5 steps with a railing? : Total 6 Click Score: 15    End of Session   Activity Tolerance: Patient limited by pain Patient left: in bed;with call bell/phone within reach (sitting EOB) Nurse Communication: Mobility status PT Visit Diagnosis: Difficulty in walking, not elsewhere classified (R26.2);Other abnormalities of gait and mobility (R26.89);Pain Pain - Right/Left: Left Pain - part of body: Hip     Time: [id number] PT Time Calculation (min) (ACUTE ONLY): 12 min  Charges:  $Therapeutic Activity: 8-22 mins                     [place] Pager [phone] Office [place] 05/12/2021, 4:49 PM

## 2021-05-12 NOTE — Telephone Encounter (Signed)
Called to confirm/remind patient of their appointment at the [facility] on 05/13/21. However, patient stated he's in the hospital currently.

## 2021-05-12 NOTE — Progress Notes (Signed)
CSW spoke with [name] at [facility] who states she does not have any HD beds at this time.  CSW resent patient's clinicals to [facility], and [facility] for review.  Madilyn Fireman, MSW, LCSW Transitions of Care  Clinical Social Worker II [phone]

## 2021-05-12 NOTE — TOC Progression Note (Signed)
Transition of Care [facility]) - Progression Note    Patient Details  Name: Phillip Young MRN: [id number] Date of Birth: 06/18/1986  Transition of Care [facility]) CM/SW [place], RN Phone Number: 05/12/2021, 10:51 AM  Clinical Narrative:    Case management met with the patient at the bedside to discuss transitions of care and possible rehabilitation placement.  The patient states that he is excited that his weight is down to 385 lbs after diet and medications in the hospital.  The patient states that he is agreeable to SNF placement for rehabilitation so that he can get home at some point and be able to return to work again at a [employer].  The patient states that he has home oxygen through [employer] and [employer] through [employer] that is currently located at his sister's home.  I will ask that patient to have his Trilogy Bipap delivered to the hospital so that he can take it with him to skilled nursing facility for rehabilitation/ SNf placement.  I spoke with Erin Hearing, NP and explained that the patient may have more reliable bed offers now that has weight is improved.  Shon Baton, MSW spoke with [name], MSW at [facility] and she does not have an available HD bed this week.  Shon Baton, MSW to explore [facility] options and availability and will send the patient's clinicals back out in the hub to review.  The patient will need midline / PICC placement for IV antibiotics at the facility through 05/30/2021.  I spoke with Erin Hearing, NP and she plans to place orders for possible CVL placement through IR.  CM and MSW with DTP Team will continue to follow the patient for SNF placement able to provide HD transportation.   Expected Discharge Plan: [place] Barriers to Discharge: Continued Medical Work up  Expected Discharge Plan and Services Expected Discharge Plan: [place] In-house Referral: Clinical Social  Work Discharge Planning Services: CM Consult Post Acute Care Choice: [place] arrangements for the past 2 months: Single Family Home                 DME Arranged:  (Patient states he has home oxygen and Trilogy Bipap Physicist, medical)  - present at his sister's home) DME Agency: [employer] Date DME Agency Contacted: 04/14/21 Time DME Agency Contacted: [phone] Representative spoke with at DME Agency: [place] (SDOH) Interventions Food Insecurity Interventions: Assist with SNAP Application Financial Strain Interventions: Other (Comment) (Referral to [facility]) Housing Interventions: Intervention Not Indicated Transportation Interventions: Intervention Not Indicated  Readmission Risk Interventions Readmission Risk Prevention Plan 05/12/2021 10/16/2020 10/16/2020  Transportation Screening Complete Complete Complete  PCP or Specialist Appt within 5-7 Days - Complete Complete  Home Care Screening - Complete -  Medication Review (RN CM) - Referral to Pharmacy -  Medication Review ([place]) Complete - -  PCP or Specialist appointment within 3-5 days of discharge Complete - -  [place] or Home Care Consult Complete - -  SW Recovery Care/Counseling Consult Complete - -  Palliative Care Screening Complete - -  Skilled Nursing Facility Complete - -  Some recent data might be hidden

## 2021-05-12 NOTE — Plan of Care (Signed)
  Problem: Education: Goal: Knowledge of General Education information will improve Description: Including pain rating scale, medication(s)/side effects and non-pharmacologic comfort measures Outcome: Progressing   Problem: Health Behavior/Discharge Planning: Goal: Ability to manage health-related needs will improve Outcome: Progressing   Problem: Coping: Goal: Level of anxiety will decrease Outcome: Progressing   Problem: Pain Managment: Goal: General experience of comfort will improve Outcome: Progressing   

## 2021-05-12 NOTE — Progress Notes (Signed)
Brief Palliative Medicine Progress Note:  Chart review performed. Patient's goals remain clear and he is participating in recommend therapy sessions and no longer refusing treatments. PMT Will sign off.   Please re-consult if any future needs arise.  Thank you for allowing PMT to assist in the care of this patient.  Carolyne Whitsel M. [place] [facility] Palliative Medicine Team Team Phone: [phone] NO CHARGE

## 2021-05-13 ENCOUNTER — Inpatient Hospital Stay (HOSPITAL_COMMUNITY): Payer: BC Managed Care – PPO

## 2021-05-13 ENCOUNTER — Encounter (HOSPITAL_COMMUNITY): Payer: BC Managed Care – PPO

## 2021-05-13 DIAGNOSIS — R7881 Bacteremia: Secondary | ICD-10-CM | POA: Diagnosis not present

## 2021-05-13 DIAGNOSIS — B9561 Methicillin susceptible Staphylococcus aureus infection as the cause of diseases classified elsewhere: Secondary | ICD-10-CM | POA: Diagnosis not present

## 2021-05-13 MED ORDER — DARBEPOETIN ALFA 100 MCG/0.5ML IJ SOSY
100.0000 ug | PREFILLED_SYRINGE | INTRAMUSCULAR | Status: DC
  Administered 2021-05-14: 100 ug via INTRAVENOUS
  Filled 2021-05-13 (×2): qty 0.5

## 2021-05-13 NOTE — Progress Notes (Signed)
Phone call to pt RN to bring pt to radiology for procedure. Per RN, pt states that no one has explained the procedure to him and he will not come down until he knows about the procedure. Pt is also concerned that the CVL will be placed in his neck and he does not want it in his neck. I advised that I will inform radiology PA so that they can come explain the procedure to the pt. RN is appreciative

## 2021-05-13 NOTE — TOC Progression Note (Addendum)
Transition of Care [facility]) - Progression Note    Patient Details  Name: Phillip Young MRN: [id number] Date of Birth: 11/30/1985  Transition of Care [facility]) CM/SW Contact  Janae Bridgeman, RN Phone Number: 05/13/2021, 9:09 AM  Clinical Narrative:    CM noted that patient currently with no bed offers.  [facility] retracted the bed offer to the facility due to staffing issues at the facility at this time.  I called and spoke with Cathlean Marseilles, CM with [facility] and Pam, RNCM at [facility] and asked that the facilities review the patient for possible placement for rehabilitation services.  Patient is agreeable to exploring rehabilitation options for the patient at this time.  CM and MSW with DTP Team will continue to follow the patient for needed rehabilitation services.  No SNF bed offered at this time due to patient's medical complexity.  05/13/2021 1059 - CM met with the patient to explain that Cathlean Marseilles, CM with [facility] was going to visit with the patient to discuss possible CIR.  The patient states that he does not want an IV placed in his neck.  I notified Junious Silk, NP and made her aware that the patient was declining IJ placement.  CM and MSW with DTP Team will continue to follow the patient for discharge planning.  05/13/2021 1300 - CM spoke with Cathlean Marseilles, CM with [facility] and she plans to meet with the patient today around 2:30 to determine if the patient would be a candidate for their facility.  The patient is aware of the visit from the facility.   1500 - CM spoke with Cathlean Marseilles, CM with [facility] and the facility will be willing to admit the patient to the facility if the patient is agreeable to a PICC line / IJ or change the IV antibiotics to po route.  Junious Silk, NP was informed and states that she will speak with ID physician regarding this matter.  Expected Discharge Plan: Skilled Nursing Facility Barriers to Discharge:  Continued Medical Work up  Expected Discharge Plan and Services Expected Discharge Plan: Skilled Nursing Facility In-house Referral: Clinical Social Work Discharge Planning Services: CM Consult Post Acute Care Choice: Skilled Nursing Facility Living arrangements for the past 2 months: Single Family Home                 DME Arranged:  (Patient states he has home oxygen and Trilogy Bipap Electrical engineer)  - present at his sister's home) DME Agency: [employer] Date DME Agency Contacted: 04/14/21 Time DME Agency Contacted: [phone] Representative spoke with at DME Agency: Oletha Cruel             Social Determinants of Health (SDOH) Interventions Food Insecurity Interventions: Assist with SNAP Application Financial Strain Interventions: Other (Comment) (Referral to [facility]) Housing Interventions: Intervention Not Indicated Transportation Interventions: Intervention Not Indicated  Readmission Risk Interventions Readmission Risk Prevention Plan 05/12/2021 10/16/2020 10/16/2020  Transportation Screening Complete Complete Complete  PCP or Specialist Appt within 5-7 Days - Complete Complete  Home Care Screening - Complete -  Medication Review (RN CM) - Referral to Pharmacy -  Medication Review (RN Care Manager) Complete - -  PCP or Specialist appointment within 3-5 days of discharge Complete - -  [place] or Home Care Consult Complete - -  SW Recovery Care/Counseling Consult Complete - -  Palliative Care Screening Complete - -  Skilled Nursing Facility Complete - -  Some recent data might be hidden

## 2021-05-13 NOTE — Progress Notes (Signed)
[facility] PROGRESS NOTE  Phillip Young [id number] DOB: 07-09-1986 DOA: 04/12/2021 PCP: [name], MD  Status: Remains inpatient appropriate because:  Continued requirement for IV antibiotics to treat bacteremia with osteomyelitis; significant physical deconditioning that will eventually require SNF for rehab; inpatient hemodialysis-once able to tolerate dialysis in chair will be candidate for outpatient hemodialysis  Barriers to discharge:  Continued requirement for IV antibiotic, SNF bed availability for superobese patient (BMI 64.6), patient lived alone prior to admission and must be able to navigate 6 steps before eventual discharge back to home  Level of care:  Telemetry Cardiac   Code Status: Full Family Communication: Patient only DVT prophylaxis: Eliquis COVID vaccination status: COVID + January 36   HPI: 35 year old with past medical history significant for severe biventricular systolic heart failure with known ejection fraction less than 20%, diabetes type 2, paroxysmal a flutter on Eliquis, recently started on hemodialysis with recent extensive hospitalization with cardiogenic shock and cardiorenal syndrome progressed to ESRD presented back to the emergency room with shortness of breath, left wrist and right knee pain, unable to ambulate.  Home oxygen concentrator not working.  Chest x-ray showed pulmonary edema, COVID-19 negative.  Patient was found to have MSSA bacteremia due to infected dialysis catheter.  ID and nephrology consulted.  After catheter holiday he received new permanent cath on 9/28 and restarted on hemodialysis.  Subjective: Awake.  Asked which medication I have started to help with his nausea and vomiting and I explained to him how Elavil works.  No further question  Objective: Vitals:   05/13/21 0300 05/13/21 0331  BP:  112/73  Pulse:  84  Resp: 18 20  Temp:  98.2 F (36.8 C)  SpO2:  100%    Intake/Output Summary (Last 24  hours) at 05/13/2021 0726 Last data filed at 05/12/2021 2300 Gross per 24 hour  Intake 1188 ml  Output --  Net 1188 ml   Filed Weights   05/11/21 0902 05/11/21 1324 05/12/21 1521  Weight: (!) 238 kg (!) 174.7 kg (!) 176.6 kg    Exam:  Constitutional: NAD, calm Respiratory: Anterior lung sounds are clear to auscultation normal respiratory effort.  3 L nasal cannula oxygen with O2 sats 100% Cardiovascular: Regular rate and rhythm, S1-S2, no extremity edema.  Remains normotensive Abdomen: no tenderness, no masses palpated.  Bowel sounds positive. LBM 10/14 Neurologic: CN 2-12 grossly intact. Sensation intact, DTR normal. Strength 4-5/5 x all 4 extremities.  Psychiatric: Normal judgment and insight. Alert and oriented x 3. Normal mood.    Assessment/Plan: Acute problems:   MSSA Bacteremia/Left SI septic arthritis, sacrum and iliac bone osteomyelitis/abscess left iliacus muscle, small abscess left piriformis muscle:  Imaging + for osteomyelitis and abscess formation; blood cultures positive for MSSA-follow-up blood cultures negative TTE: no vegetation-TEE: no endocarditis Perm cath (source of infection) -removed.  ID recommended IV antibiotics for 6 weeks. (LD Nov 6th).  Discussed with nephrologist who recommended IJ PICC placement since skilled nursing facilities will not accept PIV. Continue scheduled oxycontin along with Oxy IR prn. Ortho team evaluated -too high risk for surgery.  Per IR, abscess small and likely not be amenable for drainage, for this reason and to avoid to give more contrast to patient to prevent worsening to any residual renal function he has left won't proceed with CT scan or procedure. Will require follow-up imaging prior to discharge   Acute on chronic respiratory failure with hypoxemia and hypercapnia/OSA with suspected OHS:  Suspected due to malfunctioning concentrator at home-DME  will need to be checked before discharge back to home Component of volume  overload as well that is currently being managed with HD Currently on baseline oxygen requirement along with nocturnal NIPPV at home   ESRD on hemodialysis: Tolerating dialysis through TDC-has not yet had permanent dialysis access placed Was evaluated by palliative care, patient agreed to have hemodialysis and further treatments.    Biventricular heart failure with associated and ICM, paroxysmal A. fib, hypotension: Known and ICM since 2019 and initially followed at [facility] EF 25 to 30% recent TTE with EF on TEE 20%.  Given BMI not a candidate for ICD Continue midodrine, Eliquis-volume management with HD  Chronic nausea 10/17 begin scheduled oral Zofran with H2 blocker 10/18 continues to report nausea.  Low-dose Elavil 10 mg at HS EF 25 to 30% on 9/24  (TEE EF was 20% on 10/11 )as well as current medications of amiodarone, Zofran and now Elavil we will check EKG today regarding QTC and check every 2 to 3 days while hospitalized.  EKG 9/28 QTC was 468 ms.  EKG 10/18 QTC was 466  Paroxysmal atrial fibrillation Continue amiodarone   Morbid obesity:  BMI more than 50 with most recent reading 54.3 On Semaglutide for weight reduction.  Current hemoglobin A1c 6.5   Narcotic induced constipation Continue Colace, Linzess and MiraLAX Continue scheduled lactulose at bedtime   Stage I decubitus ulcer at the medial left gluteal fold:  Wound care per nursing protocol      Data Reviewed: Basic Metabolic Panel: Recent Labs  Lab 05/06/21 0901 05/08/21 0643 05/11/21 0641  NA 133* 130* 127*  K 4.5 5.0 4.4  CL 96* 94* 92*  CO2 '26 24 25  '$ GLUCOSE 100* 87 91  BUN 37* 34* 39*  CREATININE 3.74* 5.12* 6.06*  CALCIUM 10.0 10.4* 10.2  PHOS 6.4* 7.5* 7.6*   Liver Function Tests: Recent Labs  Lab 05/06/21 0901 05/08/21 0643 05/11/21 0641  ALBUMIN 2.8* 3.0* 3.0*    CBC: Recent Labs  Lab 05/06/21 0901 05/08/21 0643 05/11/21 0641  WBC 7.4 6.6 6.2  HGB 10.1* 11.0* 11.0*  HCT 33.4*  34.7* 35.4*  MCV 98.5 95.3 95.7  PLT 399 351 371   Cardiac Enzymes: No results for input(s): CKTOTAL, CKMB, CKMBINDEX, TROPONINI in the last 168 hours. BNP (last 3 results) Recent Labs    11/07/20 0932 02/11/21 1500 04/12/21 1555  BNP 106.0* 194.3* 658.4*      Scheduled Meds:  allopurinol  100 mg Oral Daily   amiodarone  200 mg Oral Daily   amitriptyline  10 mg Oral QHS   apixaban  5 mg Oral BID   bisacodyl  10 mg Rectal Once   Chlorhexidine Gluconate Cloth  6 each Topical Daily   [START ON 05/15/2021] darbepoetin (ARANESP) injection - DIALYSIS  100 mcg Intravenous Q Fri-HD   docusate sodium  100 mg Oral BID   famotidine  20 mg Oral BID   lactulose  10 g Oral QHS   lanthanum  1,000 mg Oral TID WC   linaclotide  145 mcg Oral QAC breakfast   midodrine  20 mg Oral TID WC   multivitamin  1 tablet Oral QHS   oxyCODONE  10 mg Oral Q12H   polyethylene glycol  17 g Oral BID   sodium zirconium cyclosilicate  10 g Oral Once   Continuous Infusions:  anticoagulant sodium citrate     anticoagulant sodium citrate      ceFAZolin (ANCEF) IV 1 g (05/12/21 1543)  Principal Problem:   MSSA bacteremia Active Problems:   Morbid obesity ([place])   GERD without esophagitis   OSA (obstructive sleep apnea)   Chronic systolic CHF (congestive heart failure) ([name])   Paroxysmal atrial flutter ([name])   Hyponatremia   ESRD (end stage renal disease) on dialysis ([place])   Acute on chronic respiratory failure with hypoxia ([place])   Medical non-compliance   Hypotension   ESRD on dialysis [facility])   Palliative care by specialist   Full code status   Concern about end of life   Consultants: Nephrology Cardiology Orthopedics Pulmonary medicine Infectious disease Interventional radiology  Procedures: TTE TEE Placement [facility]  Antibiotics: Cefazolin 9/23 >>   Time spent: 35 minutes    Erin Hearing ANP  [facility] 7 am - 330 pm/M-F for direct patient care and secure  chat Please refer to Amion for contact info 27  days

## 2021-05-13 NOTE — Progress Notes (Signed)
[facility] Progress Note   Subjective:   Seen on HD, no new c/o's today  Objective Vitals:   05/13/21 0004 05/13/21 0300 05/13/21 0331 05/13/21 1016  BP: 126/87  112/73 126/83  Pulse: 88  84 89  Resp: '16 18 20   '$ Temp: 98 F (36.7 C)  98.2 F (36.8 C)   TempSrc: Oral  Oral Oral  SpO2: 100%  100% 100%  Weight:      Height:       Physical Exam General: Obese man, NAD. Nasal O2 in place Heart: RRR; no murmur Lungs: CTA anteriorly Abdomen: soft Extremities: Trace LE edema Dialysis Access: [facility]  Dialysis Orders: TTS/ AKI [facility] 4h  400/800    188.5kg   2/2 bath  TDC RIJ  Hep none  - no in center meds   Assessment/Plan: MSSA bacteremia: Blood Cx 9/22 + and catheter exit site purulence. S/p [facility] removal/line holiday 9/24 -> 9/28 new TDC placed. Has assoc left-sided SI joint septic athritis w/ local osteo and 2 associated small abscesses. Per ortho too high risk for surgical Rx. Per IR abscesses were small and not amenable to drainage.  S/p TEE 10/11-neg for endocarditis. On IV Cefazolin x 6 week course (thru 11/5). Per pmd BP/volume: Chronic hypotension on mido '20mg'$  TID, volume down while here, no edema/ vol ^ by exam now. Current wt's are off.  UF 3- 4 L each HD as tolerated here ESRD: Usual TTS schedule - intermittently refusing. Dialyzed Wed/Fri last week, refused Saturday. Had HD yest/ Monday. Next HD Thursday then Sat the TTS.  Biventricular congestive heart failure: Not a candidate for LVAD or transplant, per CHF team notes. Atrial fibrillation: On amiodarone + Eliquis. CKD MBD: Ca/Phos both high. On Fosrenol as binder -> refuses often. PTH low (42). Hypercalcemia possibly due to immobility and osteo. Consider  additional hyperCa work-up if rises further. 2Ca bath with HD for now. Anemia of chronic kidney disease: Hgb 11,  reducing weekly Aranesp dose from 150 to 100. Dispo - difficult SNF placement due to super morbid obesity  Kelly Splinter,  MD 05/11/2021, 11:08 AM    Additional Objective Labs: Basic Metabolic Panel: Recent Labs  Lab 05/08/21 0643 05/11/21 0641  NA 130* 127*  K 5.0 4.4  CL 94* 92*  CO2 24 25  GLUCOSE 87 91  BUN 34* 39*  CREATININE 5.12* 6.06*  CALCIUM 10.4* 10.2  PHOS 7.5* 7.6*    Liver Function Tests: Recent Labs  Lab 05/08/21 0643 05/11/21 0641  ALBUMIN 3.0* 3.0*    CBC: Recent Labs  Lab 05/08/21 0643 05/11/21 0641  WBC 6.6 6.2  HGB 11.0* 11.0*  HCT 34.7* 35.4*  MCV 95.3 95.7  PLT 351 371    Medications:  anticoagulant sodium citrate     anticoagulant sodium citrate      ceFAZolin (ANCEF) IV 1 g (05/12/21 1543)    allopurinol  100 mg Oral Daily   amiodarone  200 mg Oral Daily   amitriptyline  10 mg Oral QHS   apixaban  5 mg Oral BID   bisacodyl  10 mg Rectal Once   Chlorhexidine Gluconate Cloth  6 each Topical Daily   [START ON 05/14/2021] darbepoetin (ARANESP) injection - DIALYSIS  100 mcg Intravenous Q Thu-HD   docusate sodium  100 mg Oral BID   famotidine  20 mg Oral BID   lactulose  10 g Oral QHS   lanthanum  1,000 mg Oral TID WC   linaclotide  145 mcg Oral QAC breakfast   midodrine  20 mg Oral TID WC   multivitamin  1 tablet Oral QHS   oxyCODONE  10 mg Oral Q12H   polyethylene glycol  17 g Oral BID

## 2021-05-13 NOTE — Plan of Care (Signed)
  Problem: Education: Goal: Knowledge of General Education information will improve Description: Including pain rating scale, medication(s)/side effects and non-pharmacologic comfort measures Outcome: Progressing   Problem: Health Behavior/Discharge Planning: Goal: Ability to manage health-related needs will improve Outcome: Progressing   Problem: Activity: Goal: Risk for activity intolerance will decrease Outcome: Progressing   Problem: Pain Managment: Goal: General experience of comfort will improve Outcome: Progressing   Problem: Skin Integrity: Goal: Risk for impaired skin integrity will decrease Outcome: Progressing   

## 2021-05-13 NOTE — Progress Notes (Signed)
Went to see pt to discuss placement of tunneled CVC for prolonged abx. Pt (and mother on telephone speakerphone) are adamantly opposed to tunneled CVC due to risk of infection. States 'this is the reason I've been sick is because of these catheters' Explained that a tunneled CVC is much smaller and only for temporary use then would be removed, but yes, ultimately does carry small risk of infection.  He again, expressed wishes to not have any additional PICCs/tunneled CVCs placed. He states his PIV is working well and wants to continue using that.  Discussed with RN as well.  IR will defer procedure for now.  Ascencion Dike PA-C Interventional Radiology 05/13/2021 11:16 AM

## 2021-05-14 DIAGNOSIS — R7881 Bacteremia: Secondary | ICD-10-CM | POA: Diagnosis not present

## 2021-05-14 DIAGNOSIS — B9561 Methicillin susceptible Staphylococcus aureus infection as the cause of diseases classified elsewhere: Secondary | ICD-10-CM | POA: Diagnosis not present

## 2021-05-14 LAB — CBC
HCT: 35.1 % — ABNORMAL LOW (ref 39.0–52.0)
Hemoglobin: 11 g/dL — ABNORMAL LOW (ref 13.0–17.0)
MCH: 30.2 pg (ref 26.0–34.0)
MCHC: 31.3 g/dL (ref 30.0–36.0)
MCV: 96.4 fL (ref 80.0–100.0)
Platelets: 389 10*3/uL (ref 150–400)
RBC: 3.64 MIL/uL — ABNORMAL LOW (ref 4.22–5.81)
RDW: 17.1 % — ABNORMAL HIGH (ref 11.5–15.5)
WBC: 6.1 10*3/uL (ref 4.0–10.5)
nRBC: 0 % (ref 0.0–0.2)

## 2021-05-14 LAB — RENAL FUNCTION PANEL
Albumin: 3 g/dL — ABNORMAL LOW (ref 3.5–5.0)
Anion gap: 12 (ref 5–15)
BUN: 46 mg/dL — ABNORMAL HIGH (ref 6–20)
CO2: 27 mmol/L (ref 22–32)
Calcium: 9.9 mg/dL (ref 8.9–10.3)
Chloride: 91 mmol/L — ABNORMAL LOW (ref 98–111)
Creatinine, Ser: 8.13 mg/dL — ABNORMAL HIGH (ref 0.61–1.24)
GFR, Estimated: 8 mL/min — ABNORMAL LOW (ref >60)
Glucose, Bld: 113 mg/dL — ABNORMAL HIGH (ref 70–99)
Phosphorus: 6.8 mg/dL — ABNORMAL HIGH (ref 2.5–4.6)
Potassium: 5.3 mmol/L — ABNORMAL HIGH (ref 3.5–5.1)
Sodium: 130 mmol/L — ABNORMAL LOW (ref 135–145)

## 2021-05-14 MED ORDER — LORAZEPAM 2 MG/ML IJ SOLN
1.0000 mg | Freq: Once | INTRAMUSCULAR | Status: AC | PRN
  Administered 2021-05-14: 1 mg via INTRAVENOUS
  Filled 2021-05-14: qty 1

## 2021-05-14 MED ORDER — LANTHANUM CARBONATE 1000 MG PO CHEW: 1000.0000 mg | CHEWABLE_TABLET | Freq: Three times a day (TID) | ORAL | Status: DC

## 2021-05-14 MED ORDER — OXYCODONE HCL 10 MG PO TABS: 10.0000 mg | ORAL_TABLET | Freq: Four times a day (QID) | ORAL | 0 refills | Status: DC | PRN

## 2021-05-14 MED ORDER — ONDANSETRON HCL 4 MG/2ML IJ SOLN
4.0000 mg | Freq: Four times a day (QID) | INTRAMUSCULAR | 0 refills | Status: DC | PRN
Start: 2021-05-14 — End: 2021-08-18

## 2021-05-14 MED ORDER — RENA-VITE PO TABS: 1.0000 | ORAL_TABLET | Freq: Every day | ORAL | 0 refills | Status: DC

## 2021-05-14 MED ORDER — AMIODARONE HCL 200 MG PO TABS: 200.0000 mg | ORAL_TABLET | Freq: Every day | ORAL | Status: DC

## 2021-05-14 MED ORDER — AMITRIPTYLINE HCL 10 MG PO TABS: 10.0000 mg | ORAL_TABLET | Freq: Every day | ORAL | Status: DC

## 2021-05-14 MED ORDER — DARBEPOETIN ALFA 100 MCG/0.5ML IJ SOSY: 100.0000 ug | PREFILLED_SYRINGE | INTRAMUSCULAR | Status: DC

## 2021-05-14 MED ORDER — SALINE SPRAY 0.65 % NA SOLN: 1.0000 | NASAL | 0 refills | Status: DC | PRN

## 2021-05-14 MED ORDER — CYCLOBENZAPRINE HCL 5 MG PO TABS: 5.0000 mg | ORAL_TABLET | Freq: Three times a day (TID) | ORAL | 0 refills | Status: DC | PRN

## 2021-05-14 MED ORDER — POLYETHYLENE GLYCOL 3350 17 G PO PACK: 17.0000 g | PACK | Freq: Two times a day (BID) | ORAL | 0 refills | Status: DC

## 2021-05-14 MED ORDER — LACTULOSE 10 GM/15ML PO SOLN: 10.0000 g | Freq: Every day | ORAL | 0 refills | Status: DC

## 2021-05-14 MED ORDER — MIDODRINE HCL 10 MG PO TABS: 20.0000 mg | ORAL_TABLET | Freq: Three times a day (TID) | ORAL | Status: DC

## 2021-05-14 MED ORDER — DOCUSATE SODIUM 100 MG PO CAPS: 100.0000 mg | ORAL_CAPSULE | Freq: Two times a day (BID) | ORAL | 0 refills | Status: DC

## 2021-05-14 MED ORDER — CEFAZOLIN IV (FOR PTA / DISCHARGE USE ONLY): 2.0000 g | INTRAVENOUS | 0 refills | Status: AC

## 2021-05-14 MED ORDER — ACETAMINOPHEN 325 MG PO TABS
650.0000 mg | ORAL_TABLET | Freq: Four times a day (QID) | ORAL | Status: DC | PRN
Start: 2021-05-14 — End: 2021-05-19

## 2021-05-14 MED ORDER — OXYCODONE HCL ER 10 MG PO T12A: 10.0000 mg | EXTENDED_RELEASE_TABLET | Freq: Two times a day (BID) | ORAL | 0 refills | Status: DC

## 2021-05-14 MED ORDER — ALLOPURINOL 100 MG PO TABS: 100.0000 mg | ORAL_TABLET | Freq: Every day | ORAL | Status: DC

## 2021-05-14 MED ORDER — FAMOTIDINE 20 MG PO TABS: 20.0000 mg | ORAL_TABLET | Freq: Two times a day (BID) | ORAL | Status: DC

## 2021-05-14 MED ORDER — LINACLOTIDE 145 MCG PO CAPS: 145.0000 ug | ORAL_CAPSULE | Freq: Every day | ORAL | Status: DC

## 2021-05-14 MED ORDER — IPRATROPIUM-ALBUTEROL 0.5-2.5 (3) MG/3ML IN SOLN: 3.0000 mL | Freq: Four times a day (QID) | RESPIRATORY_TRACT | Status: DC | PRN

## 2021-05-14 MED ORDER — HEPARIN SODIUM (PORCINE) 1000 UNIT/ML IJ SOLN
INTRAMUSCULAR | Status: AC
  Filled 2021-05-14: qty 1

## 2021-05-14 MED ORDER — CEFAZOLIN SODIUM-DEXTROSE 2-4 GM/100ML-% IV SOLN: 2.0000 g | INTRAVENOUS | Status: DC

## 2021-05-14 MED ORDER — CEFAZOLIN SODIUM-DEXTROSE 2-4 GM/100ML-% IV SOLN
2.0000 g | INTRAVENOUS | Status: DC
  Administered 2021-05-16 – 2021-05-19 (×2): 2 g via INTRAVENOUS
  Filled 2021-05-14 (×4): qty 100

## 2021-05-14 NOTE — Progress Notes (Signed)
Nutrition Follow-up  DOCUMENTATION CODES:   Morbid obesity  INTERVENTION:   - Double protein portions TID with meals  - Ensure Enlive po daily, each supplement provides 350 kcal and 20 grams of protein   - Renal MVI daily  - Encourage PO intake  - Recommend removing Carb Modified restriction from pt's diet order to provide additional food options  NUTRITION DIAGNOSIS:   Increased nutrient needs related to chronic illness (ERSD on HD) as evidenced by estimated needs.  Ongoing, being addressed via supplements, double protein portions  GOAL:   Patient will meet greater than or equal to 90% of their needs  Progressing  MONITOR:   PO intake, Labs, Weight trends, I & O's, Skin  REASON FOR ASSESSMENT:   Consult Assessment of nutrition requirement/status  ASSESSMENT:   35 year old male who presented to the ED on 9/18 with SOB. PMH of ESRD on HD, CHF with EF <20%, T2DM. Pt with recent hospital admission from 02/11/21 to 03/26/21 when he required CRRT. Pt admitted with acute on chronic respiratory failure.  09/24 - TDC removal by IR 09/28 - s/p tunneled HD catheter placement 10/05 - MRI showing sacral/iliac bone osteomyelitis/septic arthritis and 2 small abscesses 10/11 - s/p TEE with no evidence of endocarditis  Pt with fosrenol ordered TID with meals, refusing ~50% of the time. Phosphorus remains elevated. Potassium also elevated.  Last HD was on 10/20 with 2869 ml net UF. No post-HD weight obtained.  Spoke with pt at bedside. Pt frustrated with limited menu options. Pt requesting Ensure supplement. Checked with RN who stated this was fine. Pt with fluid restriction so will order Ensure daily. Reached out to MD regarding removing Carb Modified diet restriction from pt's diet order; awaiting response.  EDW: 188.5 kg Admit weight: 189.4 kg Current weight: 176.6 kg Lowest weight since admit: 174.7 kg  Meal Completion: 10-100% x last 8 meals  Medications reviewed and  include: aranesp weekly, colace, pepcid, lactulose, fosrenol 1000 mg TID with meals, linzess, rena-vit, miralax, IV abx  Labs reviewed: sodium 130, potassium 5.3, phosphorus 6.8  UOP: 300 ml x 24 hours I/O's: -17.3 L since admit  NUTRITION - FOCUSED PHYSICAL EXAM:  Flowsheet Row Most Recent Value  Orbital Region No depletion  Upper Arm Region No depletion  Thoracic and Lumbar Region No depletion  Buccal Region No depletion  Temple Region No depletion  Clavicle Bone Region No depletion  Clavicle and Acromion Bone Region No depletion  Scapular Bone Region No depletion  Dorsal Hand No depletion  Patellar Region No depletion  Anterior Thigh Region No depletion  Posterior Calf Region No depletion  Edema (RD Assessment) Mild  Hair Reviewed  Eyes Reviewed  Mouth Reviewed  Skin Reviewed  Nails Reviewed       Diet Order:   Diet Order             Diet renal/carb modified with fluid restriction Diet-HS Snack? Nothing; Fluid restriction: 1500 mL Fluid; Room service appropriate? Yes; Fluid consistency: Thin  Diet effective now                   EDUCATION NEEDS:   Not appropriate for education at this time  Skin:  Skin Assessment: Reviewed RN Assessment (stage I to gluteal fold per MD note)  Last BM:  05/11/21 large type 1/type 2  Height:   Ht Readings from Last 1 Encounters:  04/12/21 '5\' 11"'$  (1.803 m)    Weight:   Wt Readings from Last 1 Encounters:  04/01/21 (!) 194.5 kg    BMI:  Body mass index is 54.3 kg/m.  Estimated Nutritional Needs:   Kcal:  2300-2500  Protein:  150-170 grams  Fluid:  1000 ml + UOP    Gustavus Bryant, MS, RD, LDN Inpatient Clinical Dietitian Please see [facility] for contact information.

## 2021-05-14 NOTE — Discharge Summary (Signed)
Physician Discharge Summary  Phillip Young JZP:[id number] DOB: Jul 21, 1986 DOA: 04/12/2021  PCP: [name], MD  Admit date: 04/12/2021 Discharge date: 05/19/2021  Time spent: 45 minutes  Recommendations for Outpatient Follow-up:  Patient will discharge home Outpatient physical therapy has been arranged for this patient.  He will receive outpatient PT at outpatient rehabilitation center on [facility]. Dialysis will be continued at his preadmission dialysis center in [facility]. ([facility] MWF) After discharge he needs to follow-up with Dr. Lincoln Maxin Once discharged from inpatient rehabilitation patient will need to follow-up with his cardiologist Dr. [name] Patient will need to continue Ancef IV with each hemodialysis treatment with last dose due on 11/6 He will need to continue BiPAP after discharge home.  Of note he has been refusing NIPPV during the hospitalization.  Baseline O2 requirements during the day via nasal cannula 3 L He will need to follow-up with the infectious disease clinic at [facility] after discharge.  An ambulatory referral has been sent   Discharge Diagnoses:  Principal Problem:   MSSA bacteremia Active Problems:   Morbid obesity ([name])   GERD without esophagitis   OSA (obstructive sleep apnea)   Chronic systolic CHF (congestive heart failure) ([name])   Paroxysmal atrial flutter ([name])   Hyponatremia   ESRD (end stage renal disease) on dialysis ([place])   Acute on chronic respiratory failure with hypoxia ([name])   Medical non-compliance   Hypotension   ESRD on dialysis [facility])   Palliative care by specialist   Full code status   Concern about end of life  SEPSIS RULED OUT  Discharge Condition: Stable  Diet recommendation: Renal cardiac modified with 1500 cc fluid restriction  Filed Weights   05/19/21 0003 05/19/21 0753 05/19/21 0817  Weight: (!) 175.3 kg (!) 175.3 kg (!) 175.3 kg    History of present illness:  35 year old with past medical  history significant for severe biventricular systolic heart failure with known ejection fraction less than 20%, diabetes type 2, paroxysmal a flutter on Eliquis, recently started on hemodialysis with recent extensive hospitalization with cardiogenic shock and cardiorenal syndrome progressed to ESRD presented back to the emergency room with shortness of breath, left wrist and right knee pain, unable to ambulate.  Home oxygen concentrator not working.  Chest x-ray showed pulmonary edema, COVID-19 negative.  Patient was found to have MSSA bacteremia due to infected dialysis catheter.  ID and nephrology consulted.  After catheter holiday he received new permanent cath on 9/28 and restarted on hemodialysis.  Hospital Course:  MSSA Bacteremia/Left SI septic arthritis, sacrum and iliac bone osteomyelitis/abscess left iliacus muscle, small abscess left piriformis muscle:  Imaging + for osteomyelitis and abscess formation; blood cultures positive for MSSA-follow-up blood cultures negative TTE: no vegetation-TEE: no endocarditis Perm cath (source of infection) -removed.  Patient had been showering her to admission with catheter in place. ID recommended IV antibiotics for 6 weeks. (LD Nov 6th).  Discussed with D and can administer antibiotics as we are currently doing with each dialysis treatment.  Pharmacist has adjusted dose back up to 2 g since he is consistently participating with dialysis Continue scheduled MS Contin along with Oxy IR prn after discharge. Ortho team evaluated -too high risk for surgery.  Per IR, abscess small and not amenable to drainage and given expectation patient could regain renal function would like to avoid additional contrast studies  Will require follow-up imaging after discharge-he will be following up at the infectious disease clinic and defer scheduling of MRI to the infectious  disease team   Acute on chronic respiratory failure with hypoxemia and hypercapnia/OSA with suspected  OHS:  Component of volume overload resolved with regular HD Currently on baseline oxygen requirement along with nocturnal NIPPV at home   ESRD on hemodialysis: Tolerating dialysis through [facility]DC-has not yet had permanent dialysis access placed   Biventricular heart failure with associated and ICM, paroxysmal A. fib, hypotension: Known ICM since 2019 -initially followed at [facility] EF 25 to 30% recent TTE with EF on TEE 20%.  Given BMI not a candidate for ICD Continue midodrine, Eliquis-volume management with HD   Chronic nausea Continue scheduled H2 blocker and Low-dose Elavil 10 mg at HS Follow QTC given above medications as well as requirement for amiodarone.  EKG 9/28 QTC was 468 ms.  EKG 10/18 QTC was 466   Paroxysmal atrial fibrillation Continue amiodarone   Morbid obesity:  BMI more than 50 with most recent reading 54.3 Prescribed semaglutide prior to admission but pharmacy documents he was not taking.  I suspect medication may be very expensive especially if insurance did not cover. Hemoglobin A1c 6.5   Narcotic induced constipation Continue Colace, Linzess and MiraLAX Continue scheduled lactulose at bedtime   Stage I decubitus ulcer at the medial left gluteal fold:  Wound care per nursing protocol    Procedures: TTE TEE Placement [facility]  Consultations: Nephrology Cardiology Orthopedics Pulmonary medicine Infectious disease Interventional radiology  Antibiotics: Cefazolin 9/23 >>  Discharge Exam: Vitals:   05/19/21 1030 05/19/21 1100  BP: 134/75 115/76  Pulse: 78 72  Resp: (!) 22 14  Temp:    SpO2:     Constitutional: NAD, calm Respiratory: Lungs CTA, normal respiratory effort.  3 L nasal cannula oxygen with O2 sats 100% Cardiovascular: Regular rate/rhythm, S1-S2, no extremity edema.  Remains normotensive Abdomen: Soft nondistended and nontender, bowel sounds positive. LBM 10/18 Neurologic: CN 2-12 grossly intact. Sensation intact, DTR normal. Strength 4-5/5  x all 4 extremities.  Psychiatric: Normal judgment and insight. Alert and oriented x 3. Normal mood.   Discharge Instructions   Discharge Instructions     Ambulatory referral to Infectious Disease   Complete by: As directed    Ambulatory referral to Physical Therapy   Complete by: As directed    Iontophoresis - 4 mg/ml of dexamethasone: No   T.E.N.S. Unit Evaluation and Dispense as Indicated: No   Call MD for:  difficulty breathing, headache or visual disturbances   Complete by: As directed    Call MD for:  extreme fatigue   Complete by: As directed    Call MD for:  persistant dizziness or light-headedness   Complete by: As directed    Call MD for:  persistant nausea and vomiting   Complete by: As directed    Call MD for:  temperature >100.4   Complete by: As directed    Diet - low sodium heart healthy   Complete by: As directed    With renal modifications regarding potassium intake   Discharge instructions   Complete by: As directed    Continue daytime nasal cannula oxygen at 3 L/min as prior to admission  Continue BiPAP at bedtime as prior to admission  Do not shower with tunneled dialysis catheter in place.  This can lead to bloodstream infections and rehospitalization  Please attend all dialysis sessions as scheduled   Home infusion instructions   Complete by: As directed    Instructions: Flushing of vascular access device: 0.9% NaCl pre/post medication administration and prn patency; Heparin 100  u/ml, 1ml for implanted ports and Heparin 10u/ml, 16ml for all other central venous catheters.   Increase activity slowly   Complete by: As directed    Keep scheduled appointment with outpatient physical therapy.  Use your rolling walker and other assistive devices while mobilizing.  Do not attempt to mobilize without assistive devices until instructed to do so by the therapy team.   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   allopurinol 100 MG tablet Commonly known as: ZYLOPRIM Take 1 tablet (100 mg total) by mouth daily.   amiodarone 200 MG tablet Commonly known as: PACERONE Take 1 tablet (200 mg total) by mouth daily.   amitriptyline 10 MG tablet Commonly known as: ELAVIL Take 1 tablet (10 mg total) by mouth at bedtime.   apixaban 5 MG Tabs tablet Commonly known as: Eliquis Take 1 tablet (5 mg total) by mouth 2 (two) times daily.   ceFAZolin  IVPB Commonly known as: ANCEF Inject 2 g into the vein Every Tuesday,Thursday,and Saturday with dialysis for 17 days. Indication:  MSSA Bacteremia First Dose: Yes Last Day of Therapy:  05/31/2021 Labs - Once weekly:  CBC/D and BMP, Labs - Every other week:  ESR and CRP Method of administration: IV Push Method of administration may be changed at the discretion of home infusion pharmacist based upon assessment of the patient and/or caregiver's ability to self-administer the medication ordered.   ceFAZolin 2-4 GM/100ML-% IVPB Commonly known as: ANCEF Inject 100 mLs (2 g total) into the vein every Tuesday, Thursday, and Saturday at 6 PM.   cyclobenzaprine 5 MG tablet Commonly known as: FLEXERIL Take 1 tablet (5 mg total) by mouth 3 (three) times daily as needed for muscle spasms.   Darbepoetin Alfa 100 MCG/0.5ML Sosy injection Commonly known as: ARANESP Inject 0.5 mLs (100 mcg total) into the vein every Thursday with hemodialysis.   docusate sodium 100 MG  capsule Commonly known as: COLACE Take 1 capsule (100 mg total) by mouth 2 (two) times daily.   famotidine 20 MG tablet Commonly known as: PEPCID Take 1 tablet (20 mg total) by mouth 2 (two) times daily.   lactulose 10 GM/15ML solution Commonly known as: CHRONULAC Take 15 mLs (10 g total) by mouth at bedtime.   lanthanum 1000 MG chewable tablet Commonly known as: FOSRENOL Chew 1 tablet (1,000 mg total) by mouth 3 (three) times daily with meals. What changed:  medication strength how much to take   linaclotide 145 MCG Caps capsule Commonly known as: LINZESS Take 1 capsule (145 mcg total) by mouth daily before breakfast.   midodrine 10 MG tablet Commonly known as: PROAMATINE Take 2 tablets (20 mg total) by mouth 3 (three) times daily with meals. What changed: additional instructions   morphine 15 MG 12 hr tablet Commonly known as:  MS CONTIN Take 1 tablet (15 mg total) by mouth every 12 (twelve) hours.   multivitamin Tabs tablet Take 1 tablet by mouth at bedtime.   ondansetron 4 MG/2ML Soln injection Commonly known as: ZOFRAN Inject 2 mLs (4 mg total) into the vein every 6 (six) hours as needed for nausea or vomiting.   Oxycodone HCl 10 MG Tabs Take 1 tablet (10 mg total) by mouth every 6 (six) hours as needed.   polyethylene glycol 17 g packet Commonly known as: MIRALAX / GLYCOLAX Take 17 g by mouth 2 (two) times daily.   sodium chloride 0.65 % Soln nasal spray Commonly known as: OCEAN Place 1 spray into both nostrils as needed for congestion.               Home Infusion Instuctions  (From admission, onward)           Start     Ordered   05/14/21 0000  Home infusion instructions       Question:  Instructions  Answer:  Flushing of vascular access device: 0.9% NaCl pre/post medication administration and prn patency; Heparin 100 u/ml, 48ml for implanted ports and Heparin 10u/ml, 12ml for all other central venous catheters.   05/14/21 [place]  (From admission, onward)           Start     Ordered   04/15/21 1650  For home use only DME 4 wheeled rolling walker with seat  Once       Comments: bariatric  Question:  Patient needs a walker to treat with the following condition  Answer:  CHF (congestive heart failure) ([place])   04/15/21 1650   04/15/21 1613  For home use only DME oxygen  Once       Comments: Evaluate for POC  Question Answer Comment  Length of Need Lifetime   Mode or (Route) Nasal cannula   Liters per Minute 4   Frequency Continuous (stationary and portable oxygen unit needed)   Oxygen conserving device Yes   Oxygen delivery system Gas      04/15/21 1624           Allergies  Allergen Reactions   Coreg [Carvedilol] Shortness Of Breath and Diarrhea    Wheezing    Heparin Other (See Comments)    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 Schedule an appointment as soon as possible for a visit.   Specialty: Internal Medicine Why: Please schedule an appointment within 7-10 days after discharge from the hospital or rehabilitation facility. Contact information: [place]  [address] [phone]         [facility] Schedule an appointment as soon as possible for a visit.   Specialty: Rehabilitation Why: Please call and follow up at the Outpatient rehabilitation facility regarding needed Outpatient therapy. Contact information: [address] [id number] [place] [phone]        [facility] Follow up.   Why: Schedule will be Monday,Wednesday,Friday.  Pt can start on Wednesday, 10/26. Pt will need to arrive at 11:30 for 11:50 chair time. Contact information: [place] [address] [phone]                  The results of significant diagnostics from this  hospitalization (including  imaging, microbiology, ancillary and laboratory) are listed below for reference.    Significant Diagnostic Studies: CT KNEE RIGHT WO CONTRAST  Result Date: 04/22/2021 CLINICAL DATA:  Right knee osteomyelitis suspected. Right knee pain. EXAM: CT OF THE RIGHT KNEE WITHOUT CONTRAST TECHNIQUE: Multidetector CT imaging of the RIGHT knee was performed according to the standard protocol. Multiplanar CT image reconstructions were also generated. COMPARISON:  None. FINDINGS: Bones/Joint/Cartilage No fracture or dislocation. Normal alignment. Moderate joint effusion. Osteochondral lesion versus erosion involving the trochlear groove. No other areas of bone destruction or erosive changes. No aggressive osseous lesion. Ligaments Ligaments are suboptimally evaluated by CT. Muscles and Tendons Muscles are normal. Patellar tendon and quadriceps tendon are intact. Soft tissue No fluid collection or hematoma.  No soft tissue mass. IMPRESSION: 1. Osteochondral lesion versus erosion involving the trochlear groove. Erosion may be secondary to an inflammatory or crystalline arthropathy versus less likely infectious etiology. If there is further clinical concern, recommend an MRI of the right knee without and with intravenous contrast. 2. Moderate joint effusion. Electronically Signed   By: Kathreen Devoid M.D.   On: 04/22/2021 18:45   MR HIP LEFT WO CONTRAST  Result Date: 04/29/2021 CLINICAL DATA:  Left hip pain.  MRSA bacteremia. EXAM: MR OF THE LEFT HIP WITHOUT CONTRAST TECHNIQUE: Multiplanar, multisequence MR imaging was performed. No intravenous contrast was administered. COMPARISON:  CT scan 04/19/2021 FINDINGS: Both hips are normally located. Small hip joint effusions but no MR findings suspicious for septic arthritis or osteomyelitis involving the hips. The pubic symphysis is intact.  No findings for septic arthritis. There are changes of septic arthritis involving the left SI joint. The joint is  slightly widened and contains fluid. There is also an elongated abscess involving the left iliacus muscle. This courses down along the iliopsoas tendon all the way to the attachment site. Associated abnormal marrow signal in the sacrum and iliac bone consistent with osteomyelitis. Is also a small abscess involving the left piriformis muscle and surrounding myofasciitis. The bladder is unremarkable. The rectum and sigmoid colon grossly normal. Prostate gland and seminal vesicles are normal. Small bilateral inguinal lymph nodes and bilateral enlarged obturator region lymph nodes. These are likely reactive/inflammatory. Mild edema like signal changes in the right gluteus maximus muscle suggesting myositis. IMPRESSION: 1. MR findings consistent with septic arthritis involving the left SI joint. Associated osteomyelitis involving the sacrum and iliac bone. 2. Elongated abscess involving the left iliacus muscle. 3. Small abscess involving the left piriformis muscle and surrounding myofasciitis. 4. No findings for septic arthritis or osteomyelitis involving the hips. Electronically Signed   By: Marijo Sanes M.D.   On: 04/29/2021 17:59   IR Fluoro Guide CV Line Right  Addendum Date: 04/23/2021   ADDENDUM REPORT: 04/23/2021 11:06 ADDENDUM: Correction: Sodium citrate was placed within the catheter lumens NOT heparin. Electronically Signed   By: Markus Daft M.D.   On: 04/23/2021 11:06   Result Date: 04/23/2021 INDICATION: 35 year old with end-stage renal disease and needs new dialysis access. Patient had a catheter holiday due to infection. EXAM: FLUOROSCOPIC AND ULTRASOUND GUIDED PLACEMENT OF A TUNNELED DIALYSIS CATHETER Physician: Stephan Minister. Anselm Pancoast, MD MEDICATIONS: Inpatient and receiving antibiotics. ANESTHESIA/SEDATION: Local anesthetic, 1% lidocaine FLUOROSCOPY TIME:  Fluoroscopy Time: 3 minutes 30 seconds (133 mGy). COMPLICATIONS: None immediate. PROCEDURE: The procedure was explained to the patient. The risks and  benefits of the procedure were discussed and the patient's questions were addressed. Informed consent was obtained from the patient. The patient was placed supine on  the interventional table. Ultrasound confirmed a patent right internal jugular vein. Ultrasound image obtained for documentation. The right neck and chest was prepped and draped in a sterile fashion. Maximal barrier sterile technique was utilized including caps, mask, sterile gowns, sterile gloves, sterile drape, hand hygiene and skin antiseptic. The right neck was anesthetized with 1% lidocaine. A small incision was made with #11 blade scalpel. A 21 gauge needle directed into the right internal jugular vein with ultrasound guidance. A micropuncture dilator set was placed. A 23 cm tip to cuff Palindrome catheter was selected. The skin below the right clavicle was anesthetized and a small incision was made with an #11 blade scalpel. A subcutaneous tunnel was formed to the vein dermatotomy site. The catheter was brought through the tunnel. The vein dermatotomy site was dilated to accommodate a peel-away sheath. The catheter was placed through the peel-away sheath and directed into the central venous structures. The tip of the catheter was placed at superior cavoatrial junction with fluoroscopy. Fluoroscopic images were obtained for documentation. Both lumens were found to aspirate and flush well. The proper amount of heparin was flushed in both lumens. The vein dermatotomy site was closed using a single layer of absorbable suture and Dermabond. The catheter was secured to the skin using Prolene suture. IMPRESSION: Successful placement of a right jugular tunneled dialysis catheter using ultrasound and fluoroscopic guidance. Electronically Signed: By: Markus Daft M.D. On: 04/23/2021 08:08   IR US Guide Vasc Access Right  Addendum Date: 04/23/2021   ADDENDUM REPORT: 04/23/2021 11:06 ADDENDUM: Correction: Sodium citrate was placed within the catheter  lumens NOT heparin. Electronically Signed   By: Markus Daft M.D.   On: 04/23/2021 11:06   Result Date: 04/23/2021 INDICATION: 35 year old with end-stage renal disease and needs new dialysis access. Patient had a catheter holiday due to infection. EXAM: FLUOROSCOPIC AND ULTRASOUND GUIDED PLACEMENT OF A TUNNELED DIALYSIS CATHETER Physician: Stephan Minister. Anselm Pancoast, MD MEDICATIONS: Inpatient and receiving antibiotics. ANESTHESIA/SEDATION: Local anesthetic, 1% lidocaine FLUOROSCOPY TIME:  Fluoroscopy Time: 3 minutes 30 seconds (133 mGy). COMPLICATIONS: None immediate. PROCEDURE: The procedure was explained to the patient. The risks and benefits of the procedure were discussed and the patient's questions were addressed. Informed consent was obtained from the patient. The patient was placed supine on the interventional table. Ultrasound confirmed a patent right internal jugular vein. Ultrasound image obtained for documentation. The right neck and chest was prepped and draped in a sterile fashion. Maximal barrier sterile technique was utilized including caps, mask, sterile gowns, sterile gloves, sterile drape, hand hygiene and skin antiseptic. The right neck was anesthetized with 1% lidocaine. A small incision was made with #11 blade scalpel. A 21 gauge needle directed into the right internal jugular vein with ultrasound guidance. A micropuncture dilator set was placed. A 23 cm tip to cuff Palindrome catheter was selected. The skin below the right clavicle was anesthetized and a small incision was made with an #11 blade scalpel. A subcutaneous tunnel was formed to the vein dermatotomy site. The catheter was brought through the tunnel. The vein dermatotomy site was dilated to accommodate a peel-away sheath. The catheter was placed through the peel-away sheath and directed into the central venous structures. The tip of the catheter was placed at superior cavoatrial junction with fluoroscopy. Fluoroscopic images were obtained for  documentation. Both lumens were found to aspirate and flush well. The proper amount of heparin was flushed in both lumens. The vein dermatotomy site was closed using a single layer of absorbable  suture and Dermabond. The catheter was secured to the skin using Prolene suture. IMPRESSION: Successful placement of a right jugular tunneled dialysis catheter using ultrasound and fluoroscopic guidance. Electronically Signed: By: Markus Daft M.D. On: 04/23/2021 08:08   DG CHEST PORT 1 VIEW  Result Date: 04/21/2021 CLINICAL DATA:  Shortness of breath and respiratory failure. EXAM: PORTABLE CHEST 1 VIEW COMPARISON:  04/12/2021 FINDINGS: Cardiac enlargement unchanged from previous exam. Low lung volumes. Pulmonary vascular congestion is again identified. New asymmetric opacity is identified within the right upper lobe which may reflect pneumonia. Atelectasis noted within the lung bases. IMPRESSION: 1. New asymmetric opacity within the right upper lobe may reflect pneumonia. 2. Stable cardiac enlargement and pulmonary vascular congestion. Electronically Signed   By: Kerby Moors M.D.   On: 04/21/2021 09:26    Microbiology: No results found for this or any previous visit (from the past 240 hour(s)).   Labs: Basic Metabolic Panel: Recent Labs  Lab 05/14/21 1439 05/16/21 0800 05/18/21 1016 05/19/21 0819  NA 130* 129* 131* 128*  K 5.3* 4.4 4.9 4.9  CL 91* 93* 94* 91*  CO2 27 26 21* 26  GLUCOSE 113* 99 97 85  BUN 46* 33* 30* 40*  CREATININE 8.13* 6.84* 6.75* 7.71*  CALCIUM 9.9 10.1 10.4* 10.1  PHOS 6.8* 5.5*  --  5.2*   Liver Function Tests: Recent Labs  Lab 05/14/21 1439 05/16/21 0800 05/18/21 0935 05/18/21 1016 05/19/21 0819  AST  --   --  23 24  --   ALT  --   --  6 6  --   ALKPHOS  --   --  89 90  --   BILITOT  --   --  0.7 0.7  --   PROT  --   --  8.4* 8.5*  --   ALBUMIN 3.0* 3.1* 3.2* 3.2* 3.2*   CBC: Recent Labs  Lab 05/14/21 1440 05/16/21 0800 05/19/21 0819  WBC 6.1 5.6 8.0   HGB 11.0* 10.9* 11.1*  HCT 35.1* 35.5* 36.0*  MCV 96.4 95.7 95.2  PLT 389 344 359    BNP (last 3 results) Recent Labs    11/07/20 0932 02/11/21 1500 04/12/21 1555  BNP 106.0* 194.3* 658.4*         Signed:  Erin Hearing ANP [facility] 05/19/2021, 11:30 AM

## 2021-05-14 NOTE — TOC Progression Note (Addendum)
Transition of Care [facility]) - Progression Note    Patient Details  Name: Laddie Woodke MRN: [id number] Date of Birth: [birth year]  Transition of Care [facility]) CM/SW [place], RN Phone Number: 05/14/2021, 9:03 AM  Clinical Narrative:    CM spoke with Wardell Honour, CM with [facility] in [facility], and she plans to visit the patient this morning to discuss possible admission to the facility.  The patient is aware of potential bed offer at the facility.  I spoke with Erin Hearing, NP and she plans to speak with Infectious disease about transitioning the patient's IV antibiotics to receive after HD sessions since the patient declined IJ placement on 05/13/2021.  CM and MSW with DTP Team will continue to follow the patient for possible bed offer at [facility] at this time.  05/14/2021 1112 - CM spoke with Melven Sartorius, Renal navigator and asked that she change the patient's HD chair temporarily to a [facility] facility if he is approved for [facility].  [facility] is going to speak with the patient and submit for insurance approval once the details of the dialysis center have been worked out.  [facility] plans to provide bariatric wheelchair transportation to an arranged HD facility once set up by [facility], Renal Navigator.  CM and MSW with DTP Team will continue to follow the patient for possible admission to [facility].  Expected Discharge Plan: IP Rehab Facility Barriers to Discharge: Continued Medical Work up  Expected Discharge Plan and Services Expected Discharge Plan: [place] In-house Referral: Clinical Social Work Discharge Planning Services: CM Consult Post Acute Care Choice: [place] arrangements for the past 2 months: Single Family Home                 DME Arranged:  (Patient states he has home oxygen and [employer]  Physicist, medical)  - present at his sister's home) DME Agency: [employer] Date DME Agency Contacted: 04/14/21 Time DME Agency Contacted: [phone] Representative spoke with at DME Agency: Andree Coss             Social Determinants of Health (SDOH) Interventions Food Insecurity Interventions: Assist with [employer] Application Financial Strain Interventions: Other (Comment) (Referral to [facility]) Housing Interventions: Intervention Not Indicated Transportation Interventions: Intervention Not Indicated  Readmission Risk Interventions Readmission Risk Prevention Plan 05/12/2021 10/16/2020 10/16/2020  Transportation Screening Complete Complete Complete  PCP or Specialist Appt within 5-7 Days - Complete Complete  Home Care Screening - Complete -  Medication Review (RN CM) - Referral to Pharmacy -  Medication Review ([place]) Complete - -  PCP or Specialist appointment within 3-5 days of discharge Complete - -  [place] or Home Care Consult Complete - -  SW Recovery Care/Counseling Consult Complete - -  Palliative Care Screening Complete - -  Skilled Nursing Facility Complete - -  Some recent data might be hidden

## 2021-05-14 NOTE — Progress Notes (Addendum)
Occupational Therapy Treatment Patient Details Name: Phillip Young MRN: [id number] DOB: Feb 23, 1986 Today's Date: 05/14/2021   History of present illness Pt is a 35 y.o. male admitted 04/12/21 with SOB, hypoxia. Workup for MSSA bacteremia, acute hypoxic respiratory failure, cardiorenal syndrome. Pt had catheter holiday with clearance of bacteremia; pt declined reinsertion of HD catheter 9/27. Course complicated by R knee and L hip pain; pelvic CT negative for acute abnormality. Of note, recent admission 02/11/21-03/26/21 with cardiogenic shock, biventricular HF, progression to ESRD with need for HD. Other PMH includes CHF, DM2, aflutter, ESRD (HD TTS).   OT comments  Patient progressing well with OT treatment. Patient able to get to eob and stand to rollator walker with supervision. Patient donned gown with min assist and performed hand hygiene while standing. Patient performed functional mobility with rollator walker and was limited to left hip pain. Acute OT to continue to follow.    Recommendations for follow up therapy are one component of a multi-disciplinary discharge planning process, led by the attending physician.  Recommendations may be updated based on patient status, additional functional criteria and insurance authorization.    Follow Up Recommendations  CIR    Equipment Recommendations  3 in 1 bedside commode    Recommendations for Other Services Rehab consult    Precautions / Restrictions Precautions Precautions: Fall Precaution Comments: Watch SpO2 (wears 1L O2 baseline) Restrictions Weight Bearing Restrictions: No       Mobility Bed Mobility Overal bed mobility: Needs Assistance Bed Mobility: Supine to Sit;Sit to Supine Rolling: Supervision   Supine to sit: Supervision     General bed mobility comments: supervision with use of bed rails    Transfers Overall transfer level: Needs assistance Equipment used: 4-wheeled walker Transfers: Sit to/from Stand Sit to  Stand: Min guard         General transfer comment: minG for safety, pt able to complete with no physical assist, soe set up for lines/O2 management    Balance Overall balance assessment: Needs assistance Sitting-balance support: Feet supported;No upper extremity supported Sitting balance-Leahy Scale: Good Sitting balance - Comments: able to maintain sitting EOB   Standing balance support: No upper extremity supported Standing balance-Leahy Scale: Fair Standing balance comment: static standing without UE support, BUE for gait for pain control                           ADL either performed or assessed with clinical judgement   ADL Overall ADL's : Needs assistance/impaired     Grooming: Set up;Standing;Wash/dry hands Grooming Details (indicate cue type and reason): performed standing at rollator         Upper Body Dressing : Minimal assistance;Standing Upper Body Dressing Details (indicate cue type and reason): donned gown while standing                 Functional mobility during ADLs: Min guard;Rolling walker;Cueing for safety;Cueing for sequencing General ADL Comments: performed all tasks standing     Vision       Perception     Praxis      Cognition Arousal/Alertness: Awake/alert Behavior During Therapy: WFL for tasks assessed/performed Overall Cognitive Status: Within Functional Limits for tasks assessed                                 General Comments: pt agreeable to commands, continues to demo controlling and manipulating behaviors through  session to increase pt control over situation.        Exercises     Shoulder Instructions       General Comments VSS on 4L O2 with gait    Pertinent Vitals/ Pain       Pain Assessment: Faces Faces Pain Scale: Hurts even more Pain Location: left hip, right knee Pain Descriptors / Indicators: Grimacing;Guarding;Moaning;Constant Pain Intervention(s): Limited activity within  patient's tolerance;Monitored during session;Repositioned;Patient requesting pain meds-RN notified  Home Living Family/patient expects to be discharged to:: Private residence                                        Prior Functioning/Environment              Frequency  Min 2X/week        Progress Toward Goals  OT Goals(current goals can now be found in the care plan section)  Progress towards OT goals: Progressing toward goals  Acute Rehab OT Goals Patient Stated Goal: return home OT Goal Formulation: With patient Time For Goal Achievement: 05/23/21 Potential to Achieve Goals: Good ADL Goals Pt Will Perform Grooming: with set-up;standing Pt Will Perform Lower Body Bathing: with min guard assist;sit to/from stand Pt Will Perform Lower Body Dressing: with set-up;sitting/lateral leans;sit to/from stand Pt Will Transfer to Toilet: with min guard assist;stand pivot transfer;bedside commode Pt/caregiver will Perform Home Exercise Program: Increased ROM;Left upper extremity;Independently;With written HEP provided  Plan Discharge plan remains appropriate;Frequency remains appropriate    Co-evaluation    PT/OT/SLP Co-Evaluation/Treatment: Yes Reason for Co-Treatment: Other (comment) (for pt comfort) PT goals addressed during session: Mobility/safety with mobility;Balance;Proper use of DME;Strengthening/ROM OT goals addressed during session: ADL's and self-care      AM-PAC OT "6 Clicks" Daily Activity     Outcome Measure   Help from another person eating meals?: None Help from another person taking care of personal grooming?: A Little Help from another person toileting, which includes using toliet, bedpan, or urinal?: A Little Help from another person bathing (including washing, rinsing, drying)?: A Lot Help from another person to put on and taking off regular upper body clothing?: A Little Help from another person to put on and taking off regular lower  body clothing?: A Lot 6 Click Score: 17    End of Session Equipment Utilized During Treatment: Rolling walker  OT Visit Diagnosis: Unsteadiness on feet (R26.81);Other abnormalities of gait and mobility (R26.89);Muscle weakness (generalized) (M62.81);Pain Pain - Right/Left: Left Pain - part of body: Hip   Activity Tolerance Patient limited by pain;Patient limited by fatigue   Patient Left in bed;with call bell/phone within reach   Nurse Communication Mobility status        Time: [id number] OT Time Calculation (min): 27 min  Charges: OT General Charges $OT Visit: 1 Visit OT Treatments $Self Care/Home Management : 8-22 mins  Lodema Hong, [place]  Pager [phone] Office [place] 05/14/2021, 2:58 PM

## 2021-05-14 NOTE — Progress Notes (Signed)
PHARMACY CONSULT NOTE FOR:  OUTPATIENT  PARENTERAL ANTIBIOTIC THERAPY (OPAT)  Indication: MSSA bacteremia Regimen: Cefazolin 2 gm IV Q TTHSa with dialysis  End date: 05/31/2021  IV antibiotic discharge orders are pended. To discharging provider:  please sign these orders via discharge navigator,  Select New Orders & click on the button choice - Manage This Unsigned Work.     Thank you for allowing pharmacy to be a part of this patient's care.  Jimmy Footman, PharmD, BCPS, BCIDP Infectious Diseases Clinical Pharmacist Phone: [phone] 05/14/2021, 9:17 AM

## 2021-05-14 NOTE — Progress Notes (Addendum)
[facility] PROGRESS NOTE  Phillip Young [id number] DOB: Nov 13, 1985 DOA: 04/12/2021 PCP: [name], MD  Status: Remains inpatient appropriate because:  Continued requirement for IV antibiotics to treat bacteremia with osteomyelitis; significant physical deconditioning that will eventually require SNF for rehab; inpatient hemodialysis-once able to tolerate dialysis in chair will be candidate for outpatient hemodialysis  Barriers to discharge:  Continued requirement for IV antibiotic, SNF bed availability for superobese patient (BMI 64.6), patient lived alone prior to admission and must be able to navigate 6 steps before eventual discharge back to home  Level of care:  Telemetry Cardiac   Code Status: Full Family Communication: Patient only DVT prophylaxis: Eliquis COVID vaccination status: COVID + January 5468   HPI: 35 year old with past medical history significant for severe biventricular systolic heart failure with known ejection fraction less than 20%, diabetes type 2, paroxysmal a flutter on Eliquis, recently started on hemodialysis with recent extensive hospitalization with cardiogenic shock and cardiorenal syndrome progressed to ESRD presented back to the emergency room with shortness of breath, left wrist and right knee pain, unable to ambulate.  Home oxygen concentrator not working.  Chest x-ray showed pulmonary edema, COVID-19 negative.  Patient was found to have MSSA bacteremia due to infected dialysis catheter.  ID and nephrology consulted.  After catheter holiday he received new permanent cath on 9/28 and restarted on hemodialysis.  Subjective: Continues to report issues with nausea  Objective: Vitals:   05/13/21 2325 05/14/21 0417  BP: (!) 112/101 121/69  Pulse: 79 80  Resp: 17 18  Temp: 98 F (36.7 C) 98.2 F (36.8 C)  SpO2: 99% 95%    Intake/Output Summary (Last 24 hours) at 05/14/2021 0726 Last data filed at 05/14/2021 0300 Gross per 24  hour  Intake 1979.97 ml  Output --  Net 1979.97 ml   Filed Weights   05/11/21 0902 05/11/21 1324 05/12/21 1521  Weight: (!) 238 kg (!) 174.7 kg (!) 176.6 kg    Exam:  Constitutional: NAD, calm Respiratory: Lungs CTA, normal respiratory effort.  3 L nasal cannula oxygen with O2 sats 100% Cardiovascular: Regular rate/rhythm, S1-S2, no extremity edema.  Remains normotensive Abdomen: Soft nondistended and nontender, bowel sounds positive. LBM 10/18 Neurologic: CN 2-12 grossly intact. Sensation intact, DTR normal. Strength 4-5/5 x all 4 extremities.  Psychiatric: Normal judgment and insight. Alert and oriented x 3. Normal mood.    Assessment/Plan: Acute problems:   MSSA Bacteremia/Left SI septic arthritis, sacrum and iliac bone osteomyelitis/abscess left iliacus muscle, small abscess left piriformis muscle:  Imaging + for osteomyelitis and abscess formation; blood cultures positive for MSSA-follow-up blood cultures negative TTE: no vegetation-TEE: no endocarditis Perm cath (source of infection) -removed.  ID recommended IV antibiotics for 6 weeks. (LD Nov 6th).  Discussed with D and can administer antibiotics as we are currently doing with each dialysis treatment.  Pharmacist has adjusted dose back up to 2 g since he is consistently participating with dialysis Continue scheduled oxycontin along with Oxy IR prn. Ortho team evaluated -too high risk for surgery.  Per IR, abscess small and not amenable to drainage and given expectation patient could regain renal function would like to avoid additional contrast studies  Will require follow-up imaging prior to discharge-MRI left hip without contrast ordered 10/19   Acute on chronic respiratory failure with hypoxemia and hypercapnia/OSA with suspected OHS:  Component of volume overload resolved with regular HD Currently on baseline oxygen requirement along with nocturnal NIPPV at home   ESRD on hemodialysis: Tolerating dialysis  through  TDC-has not yet had permanent dialysis access placed  Biventricular heart failure with associated and ICM, paroxysmal A. fib, hypotension: Known ICM since 2019 -initially followed at [facility] EF 25 to 30% recent TTE with EF on TEE 20%.  Given BMI not a candidate for ICD Continue midodrine, Eliquis-volume management with HD  Chronic nausea Continue scheduled oral Zofran with H2 blocker and Low-dose Elavil 10 mg at HS Follow QTC given above medications as well as requirement for amiodarone.  EKG 9/28 QTC was 468 ms.  EKG 10/18 QTC was 466  Paroxysmal atrial fibrillation Continue amiodarone   Morbid obesity:  BMI more than 50 with most recent reading 54.3 Prescribed semaglutide prior to admission but pharmacy documents he was not taking.  I suspect medication may be very expensive especially if insurance did not cover. Hemoglobin A1c 6.5   Narcotic induced constipation Continue Colace, Linzess and MiraLAX Continue scheduled lactulose at bedtime   Stage I decubitus ulcer at the medial left gluteal fold:  Wound care per nursing protocol      Data Reviewed: Basic Metabolic Panel: Recent Labs  Lab 05/08/21 0643 05/11/21 0641  NA 130* 127*  K 5.0 4.4  CL 94* 92*  CO2 24 25  GLUCOSE 87 91  BUN 34* 39*  CREATININE 5.12* 6.06*  CALCIUM 10.4* 10.2  PHOS 7.5* 7.6*   Liver Function Tests: Recent Labs  Lab 05/08/21 0643 05/11/21 0641  ALBUMIN 3.0* 3.0*    CBC: Recent Labs  Lab 05/08/21 0643 05/11/21 0641  WBC 6.6 6.2  HGB 11.0* 11.0*  HCT 34.7* 35.4*  MCV 95.3 95.7  PLT 351 371   Cardiac Enzymes: No results for input(s): CKTOTAL, CKMB, CKMBINDEX, TROPONINI in the last 168 hours. BNP (last 3 results) Recent Labs    11/07/20 0932 02/11/21 1500 04/12/21 1555  BNP 106.0* 194.3* 658.4*      Scheduled Meds:  allopurinol  100 mg Oral Daily   amiodarone  200 mg Oral Daily   amitriptyline  10 mg Oral QHS   apixaban  5 mg Oral BID   bisacodyl  10 mg Rectal Once    Chlorhexidine Gluconate Cloth  6 each Topical Daily   darbepoetin (ARANESP) injection - DIALYSIS  100 mcg Intravenous Q Thu-HD   docusate sodium  100 mg Oral BID   famotidine  20 mg Oral BID   lactulose  10 g Oral QHS   lanthanum  1,000 mg Oral TID WC   linaclotide  145 mcg Oral QAC breakfast   midodrine  20 mg Oral TID WC   multivitamin  1 tablet Oral QHS   oxyCODONE  10 mg Oral Q12H   polyethylene glycol  17 g Oral BID   Continuous Infusions:  anticoagulant sodium citrate     anticoagulant sodium citrate      ceFAZolin (ANCEF) IV Stopped (05/13/21 1352)    Principal Problem:   MSSA bacteremia Active Problems:   Morbid obesity ([place])   GERD without esophagitis   OSA (obstructive sleep apnea)   Chronic systolic CHF (congestive heart failure) ([name])   Paroxysmal atrial flutter ([name])   Hyponatremia   ESRD (end stage renal disease) on dialysis ([place])   Acute on chronic respiratory failure with hypoxia ([name])   Medical non-compliance   Hypotension   ESRD on dialysis [facility])   Palliative care by specialist   Full code status   Concern about end of life   Consultants: Nephrology Cardiology Orthopedics Pulmonary medicine Infectious disease Interventional radiology  Procedures: TTE TEE Placement TDC  Antibiotics: Cefazolin 9/23 >>   Time spent: 35 minutes    Erin Hearing ANP  [facility] 7 am - 330 pm/M-F for direct patient care and secure chat Please refer to Amion for contact info 28  days

## 2021-05-14 NOTE — Progress Notes (Signed)
Navigator contacted by CSW to request that pt's out-pt HD be changed to a W/S clinic while pt at [facility]. [facility] is full and does not have a later appt available for pt at this time. Spoke to [employer] [phone]) with [facility] this afternoon. She is aware of pt's need and to work on referral. Clinicals faxed to Maudie Mercury this afternoon for review. Kim also advised of pt's weight as well. Will follow and assist.    Melven Sartorius Renal Navigator [phone]

## 2021-05-14 NOTE — Progress Notes (Signed)
OT Cancellation Note  Patient Details Name: Brigham Widman MRN: [id number] DOB: 01-Mar-1986   Cancelled Treatment:    Reason Eval/Treat Not Completed: Fatigue/lethargy limiting ability to participate;Other (comment) (Patient in bed asleep.  Attempted several times to arousal patient but wan unable to keep awake to respond.  Will attempt again later today.) Lodema Hong, [place]  Pager [phone] Office [place] 05/14/2021, 8:06 AM

## 2021-05-14 NOTE — Progress Notes (Signed)
[facility] Progress Note   Subjective:   Seen in room, no c/o's   Objective Vitals:   05/13/21 1953 05/13/21 2325 05/14/21 0417 05/14/21 0747  BP: 132/79 (!) 112/101 121/69 119/77  Pulse: 81 79 80 78  Resp: '18 17 18 '$ (!) 21  Temp: 98 F (36.7 C) 98 F (36.7 C) 98.2 F (36.8 C) 98.3 F (36.8 C)  TempSrc: Oral Oral Oral Oral  SpO2: 99% 99% 95% 96%  Weight:      Height:       Physical Exam General: Obese man, NAD. Nasal O2 in place Heart: RRR; no murmur Lungs: CTA anteriorly Abdomen: soft Extremities: Trace LE edema Dialysis Access: [facility]  Dialysis Orders: TTS/ AKI [facility] 4h  400/800    188.5kg   2/2 bath  TDC RIJ  Hep none  - no in center meds   Assessment/Plan: MSSA bacteremia: Blood Cx 9/22 + and catheter exit site purulence. S/p [facility] removal/line holiday 9/24 -> 9/28 new TDC placed. Has assoc left-sided SI joint septic athritis w/ local osteo and 2 associated small abscesses. Per ortho too high risk for surgical Rx. Per IR abscesses were small and not amenable to drainage.  S/p TEE 10/11-neg for endocarditis. On IV Cefazolin x 6 week course (thru 11/5). Per pmd BP/volume: Chronic hypotension on mido '20mg'$  TID, volume down while here, no edema or vol excess by exam now. UF 3- 4 L each HD as tolerated here. ^'d fluid restriction to 1500 cc /d at pt request.  ESRD: TTS HD.  Had been refusing HD off and on, but this seems to be improving. Last HD Monday, next HD today then TTS.  Biventricular congestive heart failure: Not a candidate for LVAD or transplant, per CHF team notes. Atrial fibrillation: On amiodarone + Eliquis. CKD MBD: Ca/Phos both high. On Fosrenol as binder -> refuses often. PTH low (42). Hypercalcemia possibly due to immobility and osteo. Consider  additional hyperCa work-up if rises further. 2Ca bath with HD for now. Anemia of chronic kidney disease: Hgb 11,  reducing weekly Aranesp dose from 150 to 100. Dispo - difficult SNF placement due  to super morbid obesity  Kelly Splinter, MD 05/11/2021, 11:08 AM    Additional Objective Labs: Basic Metabolic Panel: Recent Labs  Lab 05/08/21 0643 05/11/21 0641  NA 130* 127*  K 5.0 4.4  CL 94* 92*  CO2 24 25  GLUCOSE 87 91  BUN 34* 39*  CREATININE 5.12* 6.06*  CALCIUM 10.4* 10.2  PHOS 7.5* 7.6*    Liver Function Tests: Recent Labs  Lab 05/08/21 0643 05/11/21 0641  ALBUMIN 3.0* 3.0*    CBC: Recent Labs  Lab 05/08/21 0643 05/11/21 0641  WBC 6.6 6.2  HGB 11.0* 11.0*  HCT 34.7* 35.4*  MCV 95.3 95.7  PLT 351 371    Medications:  anticoagulant sodium citrate     anticoagulant sodium citrate      ceFAZolin (ANCEF) IV      allopurinol  100 mg Oral Daily   amiodarone  200 mg Oral Daily   amitriptyline  10 mg Oral QHS   apixaban  5 mg Oral BID   bisacodyl  10 mg Rectal Once   Chlorhexidine Gluconate Cloth  6 each Topical Daily   darbepoetin (ARANESP) injection - DIALYSIS  100 mcg Intravenous Q Thu-HD   docusate sodium  100 mg Oral BID   famotidine  20 mg Oral BID   lactulose  10 g Oral QHS   lanthanum  1,000 mg Oral TID WC   linaclotide  145 mcg Oral QAC breakfast   midodrine  20 mg Oral TID WC   multivitamin  1 tablet Oral QHS   oxyCODONE  10 mg Oral Q12H   polyethylene glycol  17 g Oral BID

## 2021-05-14 NOTE — Progress Notes (Signed)
Physical Therapy Treatment Patient Details Name: Phillip Young MRN: [id number] DOB: 1985-12-27 Today's Date: 05/14/2021   History of Present Illness Pt is a 35 y.o. male admitted 04/12/21 with SOB, hypoxia. Workup for MSSA bacteremia, acute hypoxic respiratory failure, cardiorenal syndrome. Pt had catheter holiday with clearance of bacteremia; pt declined reinsertion of HD catheter 9/27. Course complicated by R knee and L hip pain; pelvic CT negative for acute abnormality. Of note, recent admission 02/11/21-03/26/21 with cardiogenic shock, biventricular HF, progression to ESRD with need for HD. Other PMH includes CHF, DM2, aflutter, ESRD (HD TTS).    PT Comments    The pt made great progress with mobility this session, completing ~63f hallway ambulation with use of 4-wheel walker and minG. The pt tolerated with 4L O2, but required increased frequency of standing rest breaks for final 50 ft due to fatigue, but he was able to demo good PLB and maintain SpO2 in 90s. No overt LOB or need for physical assist at this time with bed mobility or initial transfers. The pt remains most limited by pain in L hip as well as endurance from prolonged bedrest. Will continue with skilled PT acutely to progress mobility, endurance, and independence with transfers and gait.     Recommendations for follow up therapy are one component of a multi-disciplinary discharge planning process, led by the attending physician.  Recommendations may be updated based on patient status, additional functional criteria and insurance authorization.  Follow Up Recommendations  CIR (would need adequate rest time between sessions)      Equipment Recommendations  None recommended by PT    Recommendations for Other Services       Precautions / Restrictions Precautions Precautions: Fall Precaution Comments: Watch SpO2 (wears 1L O2 baseline) Restrictions Weight Bearing Restrictions: No     Mobility  Bed Mobility Overal bed  mobility: Needs Assistance Bed Mobility: Supine to Sit;Sit to Supine Rolling: Supervision   Supine to sit: Supervision     General bed mobility comments: supervision with use of bed rails    Transfers Overall transfer level: Needs assistance Equipment used: 4-wheeled walker Transfers: Sit to/from Stand Sit to Stand: Min guard         General transfer comment: minG for safety, pt able to complete with no physical assist, soe set up for lines/O2 management  Ambulation/Gait Ambulation/Gait assistance: Min guard Gait Distance (Feet): 90 Feet (increased frequency of standing rest breaks for last 50 ft) Assistive device: 4-wheeled walker Gait Pattern/deviations: Step-to pattern;Decreased weight shift to left;Decreased stance time - left Gait velocity: reduced Gait velocity interpretation: <1.31 ft/sec, indicative of household ambulator General Gait Details: small steps with heavy reliance on BUE support with 4-wheel walker. decreased wt shift to L due to pain in L hip. increased frequency of standing rest breaks last 50 ft      Balance Overall balance assessment: Needs assistance Sitting-balance support: Feet supported;No upper extremity supported Sitting balance-Leahy Scale: Good Sitting balance - Comments: able to maintain sitting EOB   Standing balance support: No upper extremity supported Standing balance-Leahy Scale: Fair Standing balance comment: static standing without UE support, BUE for gait for pain control                            Cognition Arousal/Alertness: Awake/alert Behavior During Therapy: WFL for tasks assessed/performed Overall Cognitive Status: Within Functional Limits for tasks assessed  General Comments: pt agreeable to commands, continues to demo controlling and manipulating behaviors through session to increase pt control over situation.      Exercises      General Comments General  comments (skin integrity, edema, etc.): VSS on 4L O2 with gait      Pertinent Vitals/Pain Pain Assessment: Faces Faces Pain Scale: Hurts even more Pain Location: left hip, right knee Pain Descriptors / Indicators: Grimacing;Guarding;Moaning;Constant Pain Intervention(s): Limited activity within patient's tolerance;Monitored during session;Repositioned;Patient requesting pain meds-RN notified     PT Goals (current goals can now be found in the care plan section) Acute Rehab PT Goals PT Goal Formulation: With patient Time For Goal Achievement: 05/28/21 Potential to Achieve Goals: Good Progress towards PT goals: Progressing toward goals    Frequency    Min 3X/week      PT Plan Current plan remains appropriate    Co-evaluation PT/OT/SLP Co-Evaluation/Treatment: Yes Reason for Co-Treatment: Other (comment) (for pt comfort) PT goals addressed during session: Mobility/safety with mobility;Balance;Proper use of DME;Strengthening/ROM        AM-PAC PT "6 Clicks" Mobility   Outcome Measure  Help needed turning from your back to your side while in a flat bed without using bedrails?: None Help needed moving from lying on your back to sitting on the side of a flat bed without using bedrails?: None Help needed moving to and from a bed to a chair (including a wheelchair)?: A Little Help needed standing up from a chair using your arms (e.g., wheelchair or bedside chair)?: A Little Help needed to walk in hospital room?: A Little Help needed climbing 3-5 steps with a railing? : A Lot 6 Click Score: 19    End of Session Equipment Utilized During Treatment: Oxygen Activity Tolerance: Patient limited by pain Patient left: in bed;with call bell/phone within reach (transport present to take pt to HD) Nurse Communication: Mobility status;Patient requests pain meds PT Visit Diagnosis: Difficulty in walking, not elsewhere classified (R26.2);Other abnormalities of gait and mobility  (R26.89);Pain Pain - Right/Left: Left Pain - part of body: Hip     Time: [id number] PT Time Calculation (min) (ACUTE ONLY): 28 min  Charges:  $Gait Training: 8-22 mins                     West Carbo, PT, DPT   Acute Rehabilitation Department Pager #: [phone]   Sandra Cockayne 05/14/2021, 2:39 PM

## 2021-05-15 MED ORDER — ENSURE ENLIVE PO LIQD
237.0000 mL | ORAL | Status: DC
  Administered 2021-05-16: 237 mL via ORAL

## 2021-05-15 NOTE — Progress Notes (Signed)
[facility] PROGRESS NOTE  Phillip Young [id number] DOB: 05/03/1986 DOA: 04/12/2021 PCP: [name], MD  Status: Remains inpatient appropriate because:  Continued requirement for IV antibiotics to treat bacteremia with osteomyelitis; significant physical deconditioning that will eventually require SNF for rehab; inpatient hemodialysis-once able to tolerate dialysis in chair will be candidate for outpatient hemodialysis  Barriers to discharge:  Continued requirement for IV antibiotic, SNF/IR bed availability for superobese patient (BMI 64.6), patient lived alone prior to admission and must be able to navigate 6 steps before eventual discharge back to home  Level of care:  Telemetry Cardiac   Code Status: Full Family Communication: Patient only DVT prophylaxis: Eliquis COVID vaccination status: COVID + January 6433   HPI: 35 year old with past medical history significant for severe biventricular systolic heart failure with known ejection fraction less than 20%, diabetes type 2, paroxysmal a flutter on Eliquis, recently started on hemodialysis with recent extensive hospitalization with cardiogenic shock and cardiorenal syndrome progressed to ESRD presented back to the emergency room with shortness of breath, left wrist and right knee pain, unable to ambulate.  Home oxygen concentrator not working.  Chest x-ray showed pulmonary edema, COVID-19 negative.  Patient was found to have MSSA bacteremia due to infected dialysis catheter.  ID and nephrology consulted.  After catheter holiday he received new permanent cath on 9/28 and restarted on hemodialysis.  Subjective: Patient awake.  Had questions as to why he could not go straight home.  Explained that he was not physically ready to live alone and he could have a fall or some other incident that would put him back in the hospital.  Objective: Vitals:   05/14/21 2255 05/15/21 0315  BP: (!) 110/56 123/69  Pulse: 97 88   Resp: 19 15  Temp: 97.9 F (36.6 C) 97.7 F (36.5 C)  SpO2: 97% 98%    Intake/Output Summary (Last 24 hours) at 05/15/2021 0720 Last data filed at 05/14/2021 1930 Gross per 24 hour  Intake 720 ml  Output 3169 ml  Net -2449 ml   Filed Weights   05/11/21 0902 05/11/21 1324 05/12/21 1521  Weight: (!) 238 kg (!) 174.7 kg (!) 176.6 kg    Exam:  Constitutional: NAD, calm Respiratory: Lungs CTA, normal respiratory effort.  3 L nasal cannula oxygen with O2 sats 100% Cardiovascular: Regular rate/rhythm, S1-S2, no extremity edema.  Remains normotensive Abdomen: Soft nondistended and nontender, bowel sounds positive. LBM 10/18 Neurologic: CN 2-12 grossly intact. Sensation intact, Strength 4-5/5 x all 4 extremities.  Psychiatric: Normal judgment and insight. Alert and oriented x 3. Normal mood.    Assessment/Plan: Acute problems:   MSSA Bacteremia/Left SI septic arthritis, sacrum and iliac bone osteomyelitis/abscess left iliacus muscle, small abscess left piriformis muscle:  Imaging + for osteomyelitis and abscess formation; blood cultures positive for MSSA-FU  blood cxs negative TTE and TEE: no vegetation/endocarditis Perm cath (source of infection) was removed.  ID recommended IV antibiotics for 6 weeks. (LD Nov 6th).  Can dose via HD cath during dialysis therefore no need for line after discharge Continue scheduled oxycontin along with Oxy IR prn. Ortho team evaluated -too high risk for surgery.  Per IR, abscess small and not amenable to drainage and given expectation patient could regain renal function would like to avoid additional contrast studies  Will require follow-up imaging prior to discharge-MRI left hip without contrast ordered 10/19 but has not been completed and I suspect this is because patient is refusing   Acute on chronic respiratory failure with  hypoxemia 2/2 vol overload and hypercapnia/OSA with suspected OHS:  Resolved with regular HD Currently on baseline  oxygen requirement along with nocturnal NIPPV at home   ESRD on hemodialysis: Tolerating dialysis through TDC-has not yet had permanent dialysis access placed  Biventricular heart failure with associated and ICM, paroxysmal A. fib, hypotension: Known ICM since 2019 -initially followed at [facility] EF 25 to 30% recent TTE;  EF on TEE 20%.  Given BMI not a candidate for ICD Continue midodrine, Eliquis-volume management with HD  Chronic nausea Continue scheduled oral Zofran with H2 blocker and Low-dose Elavil 10 mg at HS Follow QTC given above medications as well as requirement for amiodarone.  EKG 9/28 QTC was 468 ms.  EKG 10/18 QTC was 466 MS. EKG 10/21 qtc 476 MS  Paroxysmal atrial fibrillation Continue amiodarone   Morbid obesity:  BMI more than 50 with most recent reading 54.3 Prescribed semaglutide prior to admission but pharmacy documents he was not taking.  I suspect medication may be very expensive especially if insurance did not cover. Hemoglobin A1c 6.5   Narcotic induced constipation Continue Colace, Linzess and MiraLAX Continue scheduled lactulose at bedtime   Stage I decubitus ulcer at the medial left gluteal fold:  Wound care per nursing protocol      Data Reviewed: Basic Metabolic Panel: Recent Labs  Lab 05/11/21 0641 05/14/21 1439  NA 127* 130*  K 4.4 5.3*  CL 92* 91*  CO2 25 27  GLUCOSE 91 113*  BUN 39* 46*  CREATININE 6.06* 8.13*  CALCIUM 10.2 9.9  PHOS 7.6* 6.8*   Liver Function Tests: Recent Labs  Lab 05/11/21 0641 05/14/21 1439  ALBUMIN 3.0* 3.0*    CBC: Recent Labs  Lab 05/11/21 0641 05/14/21 1440  WBC 6.2 6.1  HGB 11.0* 11.0*  HCT 35.4* 35.1*  MCV 95.7 96.4  PLT 371 389   Cardiac Enzymes: No results for input(s): CKTOTAL, CKMB, CKMBINDEX, TROPONINI in the last 168 hours. BNP (last 3 results) Recent Labs    11/07/20 0932 02/11/21 1500 04/12/21 1555  BNP 106.0* 194.3* 658.4*      Scheduled Meds:  allopurinol  100 mg Oral  Daily   amiodarone  200 mg Oral Daily   amitriptyline  10 mg Oral QHS   apixaban  5 mg Oral BID   bisacodyl  10 mg Rectal Once   Chlorhexidine Gluconate Cloth  6 each Topical Daily   darbepoetin (ARANESP) injection - DIALYSIS  100 mcg Intravenous Q Thu-HD   docusate sodium  100 mg Oral BID   famotidine  20 mg Oral BID   lactulose  10 g Oral QHS   lanthanum  1,000 mg Oral TID WC   linaclotide  145 mcg Oral QAC breakfast   midodrine  20 mg Oral TID WC   multivitamin  1 tablet Oral QHS   oxyCODONE  10 mg Oral Q12H   polyethylene glycol  17 g Oral BID   Continuous Infusions:  anticoagulant sodium citrate     anticoagulant sodium citrate      ceFAZolin (ANCEF) IV      Principal Problem:   MSSA bacteremia Active Problems:   Morbid obesity ([place])   GERD without esophagitis   OSA (obstructive sleep apnea)   Chronic systolic CHF (congestive heart failure) ([name])   Paroxysmal atrial flutter ([name])   Hyponatremia   ESRD (end stage renal disease) on dialysis ([place])   Acute on chronic respiratory failure with hypoxia [facility])   Medical non-compliance   Hypotension  ESRD on dialysis [facility])   Palliative care by specialist   Full code status   Concern about end of life   Consultants: Nephrology Cardiology Orthopedics Pulmonary medicine Infectious disease Interventional radiology  Procedures: TTE TEE Placement [facility]  Antibiotics: Cefazolin 9/23 >>   Time spent: 35 minutes    Erin Hearing ANP  [facility] 7 am - 330 pm/M-F for direct patient care and secure chat Please refer to Amion for contact info 29  days

## 2021-05-15 NOTE — Progress Notes (Signed)
[facility] Progress Note   Subjective:  no c/o's   Objective Vitals:   05/14/21 1929 05/14/21 2255 05/15/21 0315 05/15/21 0820  BP: (!) 104/59 (!) 110/56 123/69 114/73  Pulse: 94 97 88 88  Resp: 20 19 15 19   Temp: 98 F (36.7 C) 97.9 F (36.6 C) 97.7 F (36.5 C) 98.2 F (36.8 C)  TempSrc: Oral Oral Oral Oral  SpO2: 98% 97% 98% 99%  Weight:      Height:       Physical Exam General: Obese man, NAD. Nasal O2 in place Heart: RRR; no murmur Lungs: CTA anteriorly Abdomen: soft Extremities: Trace LE edema Dialysis [facility]: [facility]  Dialysis Orders: TTS/ AKI [place] [facility] 4h  400/800    188.5kg   2/2 bath  TDC RIJ  Hep none  - no in center meds   Assessment/Plan: MSSA bacteremia: Blood Cx 9/22 + and catheter exit site purulence. S/p [facility] removal/line holiday 9/24 -> 9/28 new TDC placed. Has assoc left-sided SI joint septic athritis w/ local osteo and 2 associated small abscesses. Per ortho too high risk for surgical Rx. Per IR abscesses were small and not amenable to drainage.  S/p TEE 10/11-neg for endocarditis. On IV Cefazolin x 6 week course (thru 11/5). Per pmd BP/volume: Chronic hypotension on mido 20mg  TID, volume down while here, no edema or vol excess by exam now. UF 3- 4 L each HD as tolerated here. ^'d fluid restriction to 1500 cc /d per pt request.  ESRD: TTS HD.  Had been refusing HD off and on, but this seems to be improving. HD tomorrow.  Biventricular congestive heart failure: Not a candidate for LVAD or transplant, per CHF team notes. Atrial fibrillation: On amiodarone + Eliquis. CKD MBD: Ca/Phos both high. On Fosrenol as binder -> refuses often. PTH low (42). Hypercalcemia possibly due to immobility and osteo. Consider  additional hyperCa work-up if rises further. 2Ca bath with HD for now. Anemia of chronic kidney disease: Hgb 11,  reducing weekly Aranesp dose from 150 to 100. Dispo - difficult SNF placement due to super morbid obesity  Kelly Splinter,  MD 05/11/2021, 11:08 AM    Additional Objective Labs: Basic Metabolic Panel: Recent Labs  Lab 05/11/21 0641 05/14/21 1439  NA 127* 130*  K 4.4 5.3*  CL 92* 91*  CO2 25 27  GLUCOSE 91 113*  BUN 39* 46*  CREATININE 6.06* 8.13*  CALCIUM 10.2 9.9  PHOS 7.6* 6.8*    Liver Function Tests: Recent Labs  Lab 05/11/21 0641 05/14/21 1439  ALBUMIN 3.0* 3.0*    CBC: Recent Labs  Lab 05/11/21 0641 05/14/21 1440  WBC 6.2 6.1  HGB 11.0* 11.0*  HCT 35.4* 35.1*  MCV 95.7 96.4  PLT 371 389    Medications:  anticoagulant sodium citrate     anticoagulant sodium citrate      ceFAZolin (ANCEF) IV      allopurinol  100 mg Oral Daily   amiodarone  200 mg Oral Daily   amitriptyline  10 mg Oral QHS   apixaban  5 mg Oral BID   bisacodyl  10 mg Rectal Once   Chlorhexidine Gluconate Cloth  6 each Topical Daily   darbepoetin (ARANESP) injection - DIALYSIS  100 mcg Intravenous Q Thu-HD   docusate sodium  100 mg Oral BID   famotidine  20 mg Oral BID   lactulose  10 g Oral QHS   lanthanum  1,000 mg Oral TID WC   linaclotide  145 mcg Oral QAC breakfast   midodrine  20 mg Oral TID WC   multivitamin  1 tablet Oral QHS   oxyCODONE  10 mg Oral Q12H   polyethylene glycol  17 g Oral BID

## 2021-05-15 NOTE — Progress Notes (Signed)
PT Cancellation Note  Patient Details Name: Phillip Young MRN: [id number] DOB: [birth year]   Cancelled Treatment:    Reason Eval/Treat Not Completed: Pain limiting ability to participate - patient adamantly declining participation in PT treatment session today. Educ on importance of mobility and capitalizing on ambulation progression pt was able to accomplish with PT yesterday, pt still declining secondary to pain. Reports it will have to be next week.  Mabeline Caras, PT, DPT Acute Rehabilitation Services  Pager [phone] Office [place] 05/15/2021, 9:17 AM

## 2021-05-15 NOTE — Progress Notes (Signed)
Spoke to [employer] [facility] this am. They are requesting some additional lab work to process pt's referral. They have requested an ALT or AST. They also need a URR. Contacted provider with their request.   Melven Sartorius Renal Navigator [phone]

## 2021-05-16 LAB — RENAL FUNCTION PANEL
Albumin: 3.1 g/dL — ABNORMAL LOW (ref 3.5–5.0)
Anion gap: 10 (ref 5–15)
BUN: 33 mg/dL — ABNORMAL HIGH (ref 6–20)
CO2: 26 mmol/L (ref 22–32)
Calcium: 10.1 mg/dL (ref 8.9–10.3)
Chloride: 93 mmol/L — ABNORMAL LOW (ref 98–111)
Creatinine, Ser: 6.84 mg/dL — ABNORMAL HIGH (ref 0.61–1.24)
GFR, Estimated: 10 mL/min — ABNORMAL LOW
Glucose, Bld: 99 mg/dL (ref 70–99)
Phosphorus: 5.5 mg/dL — ABNORMAL HIGH (ref 2.5–4.6)
Potassium: 4.4 mmol/L (ref 3.5–5.1)
Sodium: 129 mmol/L — ABNORMAL LOW (ref 135–145)

## 2021-05-16 LAB — CBC
HCT: 35.5 % — ABNORMAL LOW (ref 39.0–52.0)
Hemoglobin: 10.9 g/dL — ABNORMAL LOW (ref 13.0–17.0)
MCH: 29.4 pg (ref 26.0–34.0)
MCHC: 30.7 g/dL (ref 30.0–36.0)
MCV: 95.7 fL (ref 80.0–100.0)
Platelets: 344 K/uL (ref 150–400)
RBC: 3.71 MIL/uL — ABNORMAL LOW (ref 4.22–5.81)
RDW: 16.9 % — ABNORMAL HIGH (ref 11.5–15.5)
WBC: 5.6 K/uL (ref 4.0–10.5)
nRBC: 0 % (ref 0.0–0.2)

## 2021-05-16 NOTE — Progress Notes (Signed)
Hand off report given to the Cristal Generous RN for continuity of care.  Palma Holter, RN

## 2021-05-16 NOTE — Progress Notes (Signed)
[facility] Progress Note   Subjective:  no c/o's seen on HD  Objective Vitals:   05/16/21 0930 05/16/21 1000 05/16/21 1030 05/16/21 1100  BP: (!) 105/49 (!) 118/46 108/61 (!) 131/49  Pulse: 87 64 84 91  Resp:   17 14  Temp:    (!) 97.5 F (36.4 C)  TempSrc:    Oral  SpO2:    94%  Height:       Physical Exam General: Obese man, NAD. Nasal O2 in place Heart: RRR; no murmur Lungs: CTA anteriorly Abdomen: soft Extremities: Trace LE edema Dialysis Access: [facility]  Dialysis Orders: TTS/ AKI [place] [facility] 4h  400/800    188.5kg   2/2 bath  TDC RIJ  Hep none  - no in center meds   Assessment/Plan: MSSA bacteremia: Blood Cx 9/22 + and catheter exit site purulence. S/p [facility] removal/line holiday 9/24 -> 9/28 new TDC placed. Has assoc left-sided SI joint septic athritis w/ local osteo and 2 associated small abscesses. Per ortho too high risk for surgical Rx. Per IR abscesses were small and not amenable to drainage.  S/p TEE 10/11-neg for endocarditis. On IV Cefazolin x 6 week course (thru 11/5). Per pmd BP/volume: Chronic hypotension on mido 20mg  TID, volume has come down while here, no edema or vol excess by exam. UF has been around 3- 3.5 L each HD as tolerated here. ^'d fluid restriction to 1500 cc /d per pt request.  ESRD: TTS HD.  Had been refusing HD off and on, but this seems to be improving. HD today in progress.  Biventricular congestive heart failure: Not a candidate for LVAD or transplant, per CHF team notes. Atrial fibrillation: On amiodarone + Eliquis. CKD MBD: Ca/Phos both high. On Fosrenol as binder -> refuses often. PTH low (42). Hypercalcemia possibly due to immobility and osteo. Consider  additional hyperCa work-up if rises further. 2Ca bath with HD for now. Anemia of chronic kidney disease: Hgb 11,  reducing weekly Aranesp dose from 150 to 100. Dispo/ debility - difficult SNF placement due to super morbid obesity. Has been improving some w/ this mobility,  working w/ PT.   Kelly Splinter, MD 05/11/2021, 11:08 AM    Additional Objective Labs: Basic Metabolic Panel: Recent Labs  Lab 05/11/21 0641 05/14/21 1439 05/16/21 0800  NA 127* 130* 129*  K 4.4 5.3* 4.4  CL 92* 91* 93*  CO2 25 27 26   GLUCOSE 91 113* 99  BUN 39* 46* 33*  CREATININE 6.06* 8.13* 6.84*  CALCIUM 10.2 9.9 10.1  PHOS 7.6* 6.8* 5.5*    Liver Function Tests: Recent Labs  Lab 05/11/21 0641 05/14/21 1439 05/16/21 0800  ALBUMIN 3.0* 3.0* 3.1*    CBC: Recent Labs  Lab 05/11/21 0641 05/14/21 1440 05/16/21 0800  WBC 6.2 6.1 5.6  HGB 11.0* 11.0* 10.9*  HCT 35.4* 35.1* 35.5*  MCV 95.7 96.4 95.7  PLT 371 389 344    Medications:  anticoagulant sodium citrate     anticoagulant sodium citrate      ceFAZolin (ANCEF) IV      allopurinol  100 mg Oral Daily   amiodarone  200 mg Oral Daily   amitriptyline  10 mg Oral QHS   apixaban  5 mg Oral BID   bisacodyl  10 mg Rectal Once   Chlorhexidine Gluconate Cloth  6 each Topical Daily   darbepoetin (ARANESP) injection - DIALYSIS  100 mcg Intravenous Q Thu-HD   docusate sodium  100 mg Oral BID  famotidine  20 mg Oral BID   feeding supplement  237 mL Oral Q24H   lactulose  10 g Oral QHS   lanthanum  1,000 mg Oral TID WC   linaclotide  145 mcg Oral QAC breakfast   midodrine  20 mg Oral TID WC   multivitamin  1 tablet Oral QHS   oxyCODONE  10 mg Oral Q12H   polyethylene glycol  17 g Oral BID

## 2021-05-16 NOTE — Progress Notes (Signed)
[facility] PROGRESS NOTE  Quantay Zaremba JWJ:[id number] DOB: 1985/09/16 DOA: 04/12/2021 PCP: [name], MD  Status: Remains inpatient appropriate because:  Continued requirement for IV antibiotics to treat bacteremia with osteomyelitis; significant physical deconditioning that will eventually require SNF for rehab; inpatient hemodialysis-once able to tolerate dialysis in chair will be candidate for outpatient hemodialysis  Barriers to discharge:  Continued requirement for IV antibiotic, SNF bed availability for superobese patient (BMI 64.6), patient lived alone prior to admission and must be able to navigate 6 steps before eventual discharge back to home  Level of care:  Telemetry Cardiac   Code Status: Full Family Communication: Patient only DVT prophylaxis: Eliquis COVID vaccination status: COVID + January 2022   Summary: Patient is a 35 year old African-American male, morbidly obese, with past medical history significant for severe biventricular systolic heart failure with known ejection fraction less than 20%, diabetes type 2, paroxysmal a flutter on Eliquis, recently started on hemodialysis with recent extensive hospitalization with cardiogenic shock and cardiorenal syndrome progressed to ESRD presented back to the emergency room with shortness of breath, left wrist and right knee pain, unable to ambulate.  Home oxygen concentrator not working.  Chest x-ray showed pulmonary edema, COVID-19 negative.  Patient was found to have MSSA bacteremia due to infected dialysis catheter.  ID and nephrology consulted.  After catheter holiday he received new permanent cath on 9/28 and restarted on hemodialysis.  The plan is for patient to complete 6 weeks of IV antibiotics.  Currently, patient is on IV Ancef.  05/16/2021: Patient seen on hemodialysis.  No new complaints.  Goal ultrafiltration is 3 L.  Blood pressure stable.  Patient has right chest hemodialysis access.  No fever or  chills.  No shortness of breath.  Patient is asking for consistent carbohydrate diet to be discontinued.  I have explained to the patient the need to remain on consistent carbohydrate diet.  Patient claims that he already has nondiabetic meals from outside source, which I have counseled patient to desist from.    Subjective: No nausea or vomiting. No fever or chills. No chest pain.    Objective: Vitals:   05/16/21 1100 05/16/21 1205  BP: (!) 131/49 100/65  Pulse: 91 90  Resp: 14 18  Temp: (!) 97.5 F (36.4 C) 97.8 F (36.6 C)  SpO2: 94% 99%    Intake/Output Summary (Last 24 hours) at 05/16/2021 1627 Last data filed at 05/16/2021 1300 Gross per 24 hour  Intake 720 ml  Output 2690 ml  Net -1970 ml    Filed Weights    Exam:  Constitutional: Patient is morbidly obese.  Patient is not in any distress.  Patient is awake and alert.  Right chest hemodialysis access is noted. Respiratory: Clear to auscultation anteriorly.  Cardiovascular: S1-S2.     Abdomen: Morbidly obese.  Nontender.  Soft.  Organs are difficult to assess.   Neurologic: Awake and alert.  Patient moves all extremities. Extremities: Patient has bilateral leg edema.   Assessment/Plan: MSSA Bacteremia/Left SI septic arthritis, sacrum and iliac bone osteomyelitis/abscess left iliacus muscle, small abscess left piriformis muscle:  Imaging + for osteomyelitis and abscess formation; blood cultures positive for MSSA-follow-up blood cultures negative TTE: no vegetation-TEE: no endocarditis Perm cath (source of infection) -removed.  ID recommended IV antibiotics for 6 weeks. (LD Nov 6th).  Discussed with D and can administer antibiotics as we are currently doing with each dialysis treatment.  Pharmacist has adjusted dose back up to 2 g since he is consistently participating  with dialysis Continue scheduled oxycontin along with Oxy IR prn. Ortho team evaluated -too high risk for surgery.  Per IR, abscess small and not  amenable to drainage and given expectation patient could regain renal function would like to avoid additional contrast studies  Will require follow-up imaging prior to discharge-MRI left hip without contrast ordered 10/19 05/16/2021: Complete course of antibiotics.  The plan is for 6 weeks of IV Ancef.   Acute on chronic respiratory failure with hypoxemia and hypercapnia/OSA with suspected OHS:  Component of volume overload resolved with regular HD Currently on baseline oxygen requirement along with nocturnal NIPPV at home 05/16/2021: Consider sleep studies and discharge.   ESRD on hemodialysis: Tolerating dialysis through TDC-has not yet had permanent dialysis access placed 05/16/2021: Patient seen on hemodialysis.  Goal UF of 3 L.  Vitals are stable.  Nephrology input is appreciated.  Patient has right chest hemodialysis access.  Biventricular heart failure with associated and ICM, paroxysmal A. fib, hypotension: Known ICM since 2019 -initially followed at [facility] EF 25 to 30% recent TTE with EF on TEE 20%.  Given BMI not a candidate for ICD Continue midodrine, Eliquis-volume management with HD 05/16/2021: Currently compensated  Paroxysmal atrial fibrillation Continue amiodarone 05/16/2021: Patient is rate controlled.  Continue apixaban and amiodarone.   Morbid obesity:  BMI more than 50 with most recent reading 54.3 Prescribed semaglutide prior to admission but pharmacy documents he was not taking.  I suspect medication may be very expensive especially if insurance did not cover. Hemoglobin A1c 6.5 05/16/2021: -Diet and exercise. -PCP to consider referral for possible bariatric surgery on discharge.   Narcotic induced constipation Continue Colace, Linzess and MiraLAX Continue scheduled lactulose at bedtime 05/16/2021: May consider using methylnaltrexone (Relistor) if needed.   Stage I decubitus ulcer at the medial left gluteal fold:  Wound care per nursing protocol       Data Reviewed: Basic Metabolic Panel: Recent Labs  Lab 05/11/21 0641 05/14/21 1439 05/16/21 0800  NA 127* 130* 129*  K 4.4 5.3* 4.4  CL 92* 91* 93*  CO2 25 27 26   GLUCOSE 91 113* 99  BUN 39* 46* 33*  CREATININE 6.06* 8.13* 6.84*  CALCIUM 10.2 9.9 10.1  PHOS 7.6* 6.8* 5.5*    Liver Function Tests: Recent Labs  Lab 05/11/21 0641 05/14/21 1439 05/16/21 0800  ALBUMIN 3.0* 3.0* 3.1*     CBC: Recent Labs  Lab 05/11/21 0641 05/14/21 1440 05/16/21 0800  WBC 6.2 6.1 5.6  HGB 11.0* 11.0* 10.9*  HCT 35.4* 35.1* 35.5*  MCV 95.7 96.4 95.7  PLT 371 389 344    Cardiac Enzymes: No results for input(s): CKTOTAL, CKMB, CKMBINDEX, TROPONINI in the last 168 hours. BNP (last 3 results) Recent Labs    11/07/20 0932 02/11/21 1500 04/12/21 1555  BNP 106.0* 194.3* 658.4*       Scheduled Meds:  allopurinol  100 mg Oral Daily   amiodarone  200 mg Oral Daily   amitriptyline  10 mg Oral QHS   apixaban  5 mg Oral BID   bisacodyl  10 mg Rectal Once   Chlorhexidine Gluconate Cloth  6 each Topical Daily   darbepoetin (ARANESP) injection - DIALYSIS  100 mcg Intravenous Q Thu-HD   docusate sodium  100 mg Oral BID   famotidine  20 mg Oral BID   feeding supplement  237 mL Oral Q24H   lactulose  10 g Oral QHS   lanthanum  1,000 mg Oral TID WC   linaclotide  145 mcg Oral QAC breakfast   midodrine  20 mg Oral TID WC   multivitamin  1 tablet Oral QHS   oxyCODONE  10 mg Oral Q12H   polyethylene glycol  17 g Oral BID   Continuous Infusions:  anticoagulant sodium citrate     anticoagulant sodium citrate      ceFAZolin (ANCEF) IV      Principal Problem:   MSSA bacteremia Active Problems:   Morbid obesity ([name])   GERD without esophagitis   OSA (obstructive sleep apnea)   Chronic systolic CHF (congestive heart failure) ([name])   Paroxysmal atrial flutter ([name])   Hyponatremia   ESRD (end stage renal disease) on dialysis ([place])   Acute on chronic respiratory failure with  hypoxia ([name])   Medical non-compliance   Hypotension   ESRD on dialysis [facility])   Palliative care by specialist   Full code status   Concern about end of life   Consultants: Nephrology Cardiology Orthopedics Pulmonary medicine Infectious disease Interventional radiology  Procedures: TTE TEE Placement [facility]  Antibiotics: Cefazolin 9/23 >>   Time spent: 35 minutes    Bonnell Public, MD  [facility] 7 am - 330 pm/M-F for direct patient care and secure chat Please refer to Amion for contact info 30  days

## 2021-05-17 NOTE — Progress Notes (Signed)
Patient family member brought outside food to the patient's room during visit. This RN informed family member that the patient isn't to have outside food, per his doctors. The family member shrugged his shoulders and entered the patient room with the outside food. Agricultural consultant notified.

## 2021-05-17 NOTE — Plan of Care (Signed)
  Problem: Health Behavior/Discharge Planning: Goal: Ability to manage health-related needs will improve Outcome: Not Progressing   Problem: Clinical Measurements: Goal: Ability to maintain clinical measurements within normal limits will improve Outcome: Not Progressing Goal: Diagnostic test results will improve Outcome: Not Progressing   Problem: Activity: Goal: Risk for activity intolerance will decrease Outcome: Not Progressing   Problem: Nutrition: Goal: Adequate nutrition will be maintained Outcome: Not Progressing   Problem: Coping: Goal: Level of anxiety will decrease Outcome: Not Progressing   Problem: Pain Managment: Goal: General experience of comfort will improve Outcome: Not Progressing   Problem: Education: Goal: Ability to demonstrate management of disease process will improve Outcome: Not Progressing Goal: Ability to verbalize understanding of medication therapies will improve Outcome: Not Progressing   Problem: Education: Goal: Knowledge of disease and its progression will improve Outcome: Not Progressing   Problem: Health Behavior/Discharge Planning: Goal: Ability to manage health-related needs will improve Outcome: Not Progressing   Problem: Nutritional: Goal: Ability to make appropriate dietary choices will improve Outcome: Not Progressing

## 2021-05-17 NOTE — Progress Notes (Signed)
[facility] Progress Note   Subjective:  seen in room, notes that HD seems to "trigger my afib". Has had atrial fib in the past and recently started dialysis.    Objective Vitals:   05/16/21 1922 05/16/21 2321 05/17/21 0323 05/17/21 0829  BP: 119/79 131/69 126/70 116/69  Pulse: 77 81 83 79  Resp: (!) 26 20 20 17   Temp: 97.6 F (36.4 C) 98.1 F (36.7 C) 98.2 F (36.8 C) 97.7 F (36.5 C)  TempSrc: Oral Oral Oral Oral  SpO2: 99% 99% 100% 98%  Height:       Physical Exam General: Obese man, NAD. Nasal O2 in place Heart: RRR; no murmur Lungs: CTA anteriorly Abdomen: soft Extremities: Trace LE edema Dialysis Access: [facility]  Dialysis Orders: TTS/ AKI [facility] 4h  400/800    188.5kg   2/2 bath  TDC RIJ  Hep none  - no in center meds   Assessment/Plan: MSSA bacteremia: Blood Cx 9/22 + and catheter exit site purulence. S/p [facility] removal/line holiday 9/24 -> 9/28 new TDC placed. Has assoc left-sided SI joint septic athritis w/ local osteo and 2 associated small abscesses. Per ortho too high risk for surgical Rx. Per IR abscesses were small and not amenable to drainage.  S/p TEE 10/11-neg for endocarditis. On IV Cefazolin x 6 week course (thru 11/5). Was bed bound but is now getting up w/ PT.  BP/volume: Chronic hypotension on mido 20mg  TID, volume has come down while here, no edema or vol excess by exam. UF has been around 2.5- 3L here. ^'d fluid restriction to 1500 cc /d per pt request.  ESRD: TTS HD.  Had been refusing HD off and on, but this seems to have resolved. Next HD 10/25.  Biventricular congestive heart failure: Not a candidate for LVAD or transplant, per CHF team notes. Atrial fibrillation: On amiodarone + Eliquis. CKD MBD: phos has improved on Fosrenol as binder. PTH low (42). High Ca's probably due to immobility. Cont 2Ca bath w/ HD and no vdra. Corr Ca stable in 10-11 range.  Anemia of chronic kidney disease: Hgb 10-12 range,  reduced weekly Aranesp dose  from 150 to 100. Dispo/ debility - difficult SNF placement due to super morbid obesity. Mobility been improving, working w/ PT.   Kelly Splinter, MD 05/11/2021, 11:08 AM    Additional Objective Labs: Basic Metabolic Panel: Recent Labs  Lab 05/11/21 0641 05/14/21 1439 05/16/21 0800  NA 127* 130* 129*  K 4.4 5.3* 4.4  CL 92* 91* 93*  CO2 25 27 26   GLUCOSE 91 113* 99  BUN 39* 46* 33*  CREATININE 6.06* 8.13* 6.84*  CALCIUM 10.2 9.9 10.1  PHOS 7.6* 6.8* 5.5*    Liver Function Tests: Recent Labs  Lab 05/11/21 0641 05/14/21 1439 05/16/21 0800  ALBUMIN 3.0* 3.0* 3.1*    CBC: Recent Labs  Lab 05/11/21 0641 05/14/21 1440 05/16/21 0800  WBC 6.2 6.1 5.6  HGB 11.0* 11.0* 10.9*  HCT 35.4* 35.1* 35.5*  MCV 95.7 96.4 95.7  PLT 371 389 344    Medications:  anticoagulant sodium citrate     anticoagulant sodium citrate      ceFAZolin (ANCEF) IV Stopped (05/16/21 1835)    allopurinol  100 mg Oral Daily   amiodarone  200 mg Oral Daily   amitriptyline  10 mg Oral QHS   apixaban  5 mg Oral BID   bisacodyl  10 mg Rectal Once   Chlorhexidine Gluconate Cloth  6 each Topical Daily  darbepoetin (ARANESP) injection - DIALYSIS  100 mcg Intravenous Q Thu-HD   docusate sodium  100 mg Oral BID   famotidine  20 mg Oral BID   feeding supplement  237 mL Oral Q24H   lactulose  10 g Oral QHS   lanthanum  1,000 mg Oral TID WC   linaclotide  145 mcg Oral QAC breakfast   midodrine  20 mg Oral TID WC   multivitamin  1 tablet Oral QHS   oxyCODONE  10 mg Oral Q12H   polyethylene glycol  17 g Oral BID

## 2021-05-17 NOTE — Progress Notes (Signed)
Pt noted to have pulled his leads off. Pt reminded of the importance of leaving leads on.

## 2021-05-17 NOTE — Progress Notes (Signed)
Pt again removed leads. Stickers replaced. Leads reattached.

## 2021-05-17 NOTE — Progress Notes (Signed)
Pt asked Tech to give him his black pillow seat. She did and he pulled out a back of  [place] he had in the pillow seat, to eat.  Night RN and Agricultural consultant notified of event.

## 2021-05-17 NOTE — Progress Notes (Signed)
[facility] PROGRESS NOTE  Dareon Nunziato [id number]:[id number] DOB: [birth year] DOA: 04/12/2021 PCP: [name], MD  Status: Remains inpatient appropriate because:  Continued requirement for IV antibiotics to treat bacteremia with osteomyelitis; significant physical deconditioning that will eventually require SNF for rehab; inpatient hemodialysis-once able to tolerate dialysis in chair will be candidate for outpatient hemodialysis  Barriers to discharge:  Continued requirement for IV antibiotic, SNF bed availability for superobese patient (BMI 64.6), patient lived alone prior to admission and must be able to navigate 6 steps before eventual discharge back to home  Level of care:  Telemetry Cardiac   Code Status: Full Family Communication: Patient only DVT prophylaxis: Eliquis COVID vaccination status: COVID + January 2022   Summary: Patient is a 35 year old African-American male, morbidly obese, with past medical history significant for severe biventricular systolic heart failure with known ejection fraction less than 20%, diabetes type 2, paroxysmal a flutter on Eliquis, recently started on hemodialysis with recent extensive hospitalization with cardiogenic shock and cardiorenal syndrome progressed to ESRD presented back to the emergency room with shortness of breath, left wrist and right knee pain, unable to ambulate.  Home oxygen concentrator not working.  Chest x-ray showed pulmonary edema, COVID-19 negative.  Patient was found to have MSSA bacteremia due to infected dialysis catheter.  ID and nephrology consulted.  After catheter holiday he received new permanent cath on 9/28 and restarted on hemodialysis.  The plan is for patient to complete 6 weeks of IV antibiotics.  Currently, patient is on IV Ancef.  05/16/2021: Patient seen on hemodialysis.  No new complaints.  Goal ultrafiltration is 3 L.  Blood pressure stable.  Patient has right chest hemodialysis access.  No fever or  chills.  No shortness of breath.  Patient is asking for consistent carbohydrate diet to be discontinued.  I have explained to the patient the need to remain on consistent carbohydrate diet.  Patient claims that he already has nondiabetic meals from outside source, which I have counseled patient to desist from.  05/17/2021: Patient seen.  No new changes.  Subjective: No fever or chills. No chest pain.    Objective: Vitals:   05/17/21 0323 05/17/21 0829  BP: 126/70 116/69  Pulse: 83 79  Resp: 20 17  Temp: 98.2 F (36.8 C) 97.7 F (36.5 C)  SpO2: 100% 98%    Intake/Output Summary (Last 24 hours) at 05/17/2021 1622 Last data filed at 05/17/2021 1749 Gross per 24 hour  Intake 1052.06 ml  Output --  Net 1052.06 ml    Filed Weights    Exam:  Constitutional: Patient is morbidly obese.  Patient is not in any distress.  Patient is awake and alert.  Right chest hemodialysis access is noted. Respiratory: Clear to auscultation anteriorly.  Cardiovascular: S1-S2.     Abdomen: Morbidly obese.  Nontender.  Soft.  Organs are difficult to assess.   Neurologic: Awake and alert.  Patient moves all extremities. Extremities: Patient has bilateral leg edema.   Assessment/Plan: MSSA Bacteremia/Left SI septic arthritis, sacrum and iliac bone osteomyelitis/abscess left iliacus muscle, small abscess left piriformis muscle:  Imaging + for osteomyelitis and abscess formation; blood cultures positive for MSSA-follow-up blood cultures negative TTE: no vegetation-TEE: no endocarditis Perm cath (source of infection) -removed.  ID recommended IV antibiotics for 6 weeks. (LD Nov 6th).  Discussed with D and can administer antibiotics as we are currently doing with each dialysis treatment.  Pharmacist has adjusted dose back up to 2 g since he is consistently participating with  dialysis Continue scheduled oxycontin along with Oxy IR prn. Ortho team evaluated -too high risk for surgery.  Per IR, abscess  small and not amenable to drainage and given expectation patient could regain renal function would like to avoid additional contrast studies  Will require follow-up imaging prior to discharge-MRI left hip without contrast ordered 10/19 05/16/2021: Complete course of antibiotics.  The plan is for 6 weeks of IV Ancef. 05/17/2021: Patient will need to complete his course of IV antibiotics.  Last MRI of the hip was done on 04/29/2021.  Repeat MRI of the hip may be considered after 2 to 3 months of the initial MRI of the hip.  I understand that MRI of the hip was ordered on 05/13/2021.   Acute on chronic respiratory failure with hypoxemia and hypercapnia/OSA with suspected OHS:  Component of volume overload resolved with regular HD Currently on baseline oxygen requirement along with nocturnal NIPPV at home 05/17/2021: Consider sleep studies on discharge.   ESRD on hemodialysis: Tolerating dialysis through TDC-has not yet had permanent dialysis access placed 05/16/2021: Patient seen on hemodialysis.  Goal UF of 3 L.  Vitals are stable.  Nephrology input is appreciated.  Patient has right chest hemodialysis access. 05/17/2021: Patient is currently hemodialysis TTS.  Nephrology team is following patient.  Input is appreciated.  Biventricular heart failure with associated and ICM, paroxysmal A. fib, hypotension: Known ICM since 2019 -initially followed at [facility] EF 25 to 30% recent TTE with EF on TEE 20%.  Given BMI not a candidate for ICD Continue midodrine, Eliquis-volume management with HD 05/17/2021: Currently compensated  Paroxysmal atrial fibrillation Continue amiodarone 05/16/2021: Patient is rate controlled.  Continue apixaban and amiodarone.   Morbid obesity:  BMI more than 50 with most recent reading 54.3 Prescribed semaglutide prior to admission but pharmacy documents he was not taking.  I suspect medication may be very expensive especially if insurance did not cover. Hemoglobin A1c  6.5 05/16/2021: -Diet and exercise. -PCP to consider referral for possible bariatric surgery on discharge.   Narcotic induced constipation Continue Colace, Linzess and MiraLAX Continue scheduled lactulose at bedtime 05/16/2021: May consider using methylnaltrexone (Relistor) if needed.   Stage I decubitus ulcer at the medial left gluteal fold:  Wound care per nursing protocol  Data Reviewed: Basic Metabolic Panel: Recent Labs  Lab 05/11/21 0641 05/14/21 1439 05/16/21 0800  NA 127* 130* 129*  K 4.4 5.3* 4.4  CL 92* 91* 93*  CO2 25 27 26   GLUCOSE 91 113* 99  BUN 39* 46* 33*  CREATININE 6.06* 8.13* 6.84*  CALCIUM 10.2 9.9 10.1  PHOS 7.6* 6.8* 5.5*    Liver Function Tests: Recent Labs  Lab 05/11/21 0641 05/14/21 1439 05/16/21 0800  ALBUMIN 3.0* 3.0* 3.1*     CBC: Recent Labs  Lab 05/11/21 0641 05/14/21 1440 05/16/21 0800  WBC 6.2 6.1 5.6  HGB 11.0* 11.0* 10.9*  HCT 35.4* 35.1* 35.5*  MCV 95.7 96.4 95.7  PLT 371 389 344    Cardiac Enzymes: No results for input(s): CKTOTAL, CKMB, CKMBINDEX, TROPONINI in the last 168 hours. BNP (last 3 results) Recent Labs    11/07/20 0932 02/11/21 1500 04/12/21 1555  BNP 106.0* 194.3* 658.4*       Scheduled Meds:  allopurinol  100 mg Oral Daily   amiodarone  200 mg Oral Daily   amitriptyline  10 mg Oral QHS   apixaban  5 mg Oral BID   bisacodyl  10 mg Rectal Once   Chlorhexidine Gluconate Cloth  6 each Topical Daily   darbepoetin (ARANESP) injection - DIALYSIS  100 mcg Intravenous Q Thu-HD   docusate sodium  100 mg Oral BID   famotidine  20 mg Oral BID   feeding supplement  237 mL Oral Q24H   lactulose  10 g Oral QHS   lanthanum  1,000 mg Oral TID WC   linaclotide  145 mcg Oral QAC breakfast   midodrine  20 mg Oral TID WC   multivitamin  1 tablet Oral QHS   oxyCODONE  10 mg Oral Q12H   polyethylene glycol  17 g Oral BID   Continuous Infusions:  anticoagulant sodium citrate     anticoagulant sodium  citrate      ceFAZolin (ANCEF) IV Stopped (05/16/21 1835)    Principal Problem:   MSSA bacteremia Active Problems:   Morbid obesity ([name])   GERD without esophagitis   OSA (obstructive sleep apnea)   Chronic systolic CHF (congestive heart failure) ([name])   Paroxysmal atrial flutter ([name])   Hyponatremia   ESRD (end stage renal disease) on dialysis ([place])   Acute on chronic respiratory failure with hypoxia ([name])   Medical non-compliance   Hypotension   ESRD on dialysis [facility])   Palliative care by specialist   Full code status   Concern about end of life   Consultants: Nephrology Cardiology Orthopedics Pulmonary medicine Infectious disease Interventional radiology  Procedures: TTE TEE Placement [facility]  Antibiotics: Cefazolin 9/23 >>  Time spent: 25 minutes  Bonnell Public, MD  [facility] 7 am - 330 pm/M-F for direct patient care and secure chat Please refer to Amion for contact info 31  days

## 2021-05-17 NOTE — Progress Notes (Signed)
Pt has been refusing meal trays. Pt endorses dismay that his diet was changed to carb modified and that bagels are no longer on his meal options. Pt states that he is about to "call my home boy to bring me a pizza and I'mma eat that." Pt educated that Phosphate binders can only help to an extent and that maintaining a renal diet is the best way to prevent buildup in his system.

## 2021-05-18 LAB — COMPREHENSIVE METABOLIC PANEL
ALT: 6 U/L (ref 0–44)
AST: 24 U/L (ref 15–41)
Albumin: 3.2 g/dL — ABNORMAL LOW (ref 3.5–5.0)
Alkaline Phosphatase: 90 U/L (ref 38–126)
Anion gap: 16 — ABNORMAL HIGH (ref 5–15)
BUN: 30 mg/dL — ABNORMAL HIGH (ref 6–20)
CO2: 21 mmol/L — ABNORMAL LOW (ref 22–32)
Calcium: 10.4 mg/dL — ABNORMAL HIGH (ref 8.9–10.3)
Chloride: 94 mmol/L — ABNORMAL LOW (ref 98–111)
Creatinine, Ser: 6.75 mg/dL — ABNORMAL HIGH (ref 0.61–1.24)
GFR, Estimated: 10 mL/min — ABNORMAL LOW (ref >60)
Glucose, Bld: 97 mg/dL (ref 70–99)
Potassium: 4.9 mmol/L (ref 3.5–5.1)
Sodium: 131 mmol/L — ABNORMAL LOW (ref 135–145)
Total Bilirubin: 0.7 mg/dL (ref 0.3–1.2)
Total Protein: 8.5 g/dL — ABNORMAL HIGH (ref 6.5–8.1)

## 2021-05-18 LAB — HEPATIC FUNCTION PANEL
ALT: 6 U/L (ref 0–44)
AST: 23 U/L (ref 15–41)
Albumin: 3.2 g/dL — ABNORMAL LOW (ref 3.5–5.0)
Alkaline Phosphatase: 89 U/L (ref 38–126)
Bilirubin, Direct: <0.1 mg/dL (ref 0.0–0.2)
Total Bilirubin: 0.7 mg/dL (ref 0.3–1.2)
Total Protein: 8.4 g/dL — ABNORMAL HIGH (ref 6.5–8.1)

## 2021-05-18 MED ORDER — CHLORHEXIDINE GLUCONATE CLOTH 2 % EX PADS: 6.0000 | MEDICATED_PAD | Freq: Every day | CUTANEOUS | Status: DC

## 2021-05-18 NOTE — Progress Notes (Signed)
[facility] NEPHROLOGY PROGRESS NOTE  Assessment/ Plan:  Dialysis Orders: TTS/ AKI [facility] 4h  400/800    188.5kg   2/2 bath  TDC RIJ  Hep none  - no in center meds  # MSSA bacteremia: Blood Cx 9/22 + and catheter exit site purulence. S/p [facility] removal/line holiday 9/24 -> 9/28 new TDC placed. Has assoc left-sided SI joint septic athritis w/ local osteo and 2 associated small abscesses. Per ortho too high risk for surgical Rx. Per IR abscesses were small and not amenable to drainage.  S/p TEE 10/11-neg for endocarditis. On IV Cefazolin x 6 week course (thru 11/5). Was bed bound but is now getting up w/ PT.   #BP/volume: Chronic hypotension on mido 20mg  TID, volume has come down while here, no edema or vol excess by exam. UF has been around 2.5- 3L here. ^'d fluid restriction to 1500 cc /d per pt request.   # ESRD: TTS HD.  Had been refusing HD off and on. HD tomorrow  # Biventricular congestive heart failure: Not a candidate for LVAD or transplant, per CHF team notes.  # Atrial fibrillation: On amiodarone + Eliquis.  # CKD MBD: phos has improved on Fosrenol as binder. PTH low (42). High Ca's probably due to immobility. Cont 2Ca bath w/ HD and no vdra. Corr Ca stable in 10-11 range.   # Anemia of chronic kidney disease: Hgb 10-12 range,  reduced weekly Aranesp dose from 150 to 100.  # Dispo/ debility - difficult SNF placement due to super morbid obesity. Mobility been improving, working w/ PT.   Subjective: Seen and examined.  Patient is lying on bed comfortable.  Reports doing well without any complaint.  Denies nausea, vomiting, chest pain, shortness of breath.  No new event. Objective Vital signs in last 24 hours: Vitals:   05/17/21 2329 05/17/21 2332 05/18/21 0323 05/18/21 0330  BP: (!) 142/89  116/74   Pulse: 81 74 76   Resp: (!) 28 20 (!) 26 (!) 21  Temp: 98.4 F (36.9 C)  98 F (36.7 C)   TempSrc: Oral  Oral   SpO2: 96%  97%   Weight:      Height:        Weight change:   Intake/Output Summary (Last 24 hours) at 05/18/2021 0805 Last data filed at 05/17/2021 2200 Gross per 24 hour  Intake 948 ml  Output --  Net 948 ml       Labs: Basic Metabolic Panel: Recent Labs  Lab 05/14/21 1439 05/16/21 0800  NA 130* 129*  K 5.3* 4.4  CL 91* 93*  CO2 27 26  GLUCOSE 113* 99  BUN 46* 33*  CREATININE 8.13* 6.84*  CALCIUM 9.9 10.1  PHOS 6.8* 5.5*   Liver Function Tests: Recent Labs  Lab 05/14/21 1439 05/16/21 0800  ALBUMIN 3.0* 3.1*   No results for input(s): LIPASE, AMYLASE in the last 168 hours. No results for input(s): AMMONIA in the last 168 hours. CBC: Recent Labs  Lab 05/14/21 1440 05/16/21 0800  WBC 6.1 5.6  HGB 11.0* 10.9*  HCT 35.1* 35.5*  MCV 96.4 95.7  PLT 389 344   Cardiac Enzymes: No results for input(s): CKTOTAL, CKMB, CKMBINDEX, TROPONINI in the last 168 hours. CBG: No results for input(s): GLUCAP in the last 168 hours.  Iron Studies: No results for input(s): IRON, TIBC, TRANSFERRIN, FERRITIN in the last 72 hours. Studies/Results: No results found.  Medications: Infusions:  anticoagulant sodium citrate  anticoagulant sodium citrate      ceFAZolin (ANCEF) IV Stopped (05/16/21 1835)    Scheduled Medications:  allopurinol  100 mg Oral Daily   amiodarone  200 mg Oral Daily   amitriptyline  10 mg Oral QHS   apixaban  5 mg Oral BID   bisacodyl  10 mg Rectal Once   Chlorhexidine Gluconate Cloth  6 each Topical Daily   darbepoetin (ARANESP) injection - DIALYSIS  100 mcg Intravenous Q Thu-HD   docusate sodium  100 mg Oral BID   famotidine  20 mg Oral BID   feeding supplement  237 mL Oral Q24H   lactulose  10 g Oral QHS   lanthanum  1,000 mg Oral TID WC   linaclotide  145 mcg Oral QAC breakfast   midodrine  20 mg Oral TID WC   multivitamin  1 tablet Oral QHS   oxyCODONE  10 mg Oral Q12H   polyethylene glycol  17 g Oral BID    have reviewed scheduled and prn medications.  Physical  Exam: General:NAD, able to lie flat comfortable. Heart:RRR, s1s2 nl Lungs:clear b/l, no crackle Abdomen:soft, Non-tender, Extremities:No edema Dialysis Access: TDC in place.  Phillip Young Prasad Rick Warnick 05/18/2021,8:05 AM  LOS: 32 days

## 2021-05-18 NOTE — Progress Notes (Signed)
Attempted to call report to 3E-06 RN x3

## 2021-05-18 NOTE — Progress Notes (Signed)
Pt transported to 3E-06 in bari bed by this RN, tech, and Development worker, community. Once on the unit, pt refused portable telemetry box. Agricultural consultant notified.

## 2021-05-18 NOTE — Progress Notes (Signed)
Attempted to call report to 3E 

## 2021-05-18 NOTE — Progress Notes (Signed)
Attempted to call report to 3E-06 x2

## 2021-05-18 NOTE — Plan of Care (Signed)
  Problem: Education: Goal: Knowledge of General Education information will improve Description: Including pain rating scale, medication(s)/side effects and non-pharmacologic comfort measures Outcome: Not Progressing   Problem: Health Behavior/Discharge Planning: Goal: Ability to manage health-related needs will improve Outcome: Not Progressing   Problem: Clinical Measurements: Goal: Ability to maintain clinical measurements within normal limits will improve Outcome: Not Progressing   Problem: Nutrition: Goal: Adequate nutrition will be maintained Outcome: Not Progressing   Problem: Coping: Goal: Level of anxiety will decrease Outcome: Not Progressing   Problem: Education: Goal: Ability to demonstrate management of disease process will improve Outcome: Not Progressing Goal: Ability to verbalize understanding of medication therapies will improve Outcome: Not Progressing   Problem: Activity: Goal: Capacity to carry out activities will improve Outcome: Not Progressing   Problem: Education: Goal: Knowledge of disease and its progression will improve Outcome: Not Progressing   Problem: Health Behavior/Discharge Planning: Goal: Ability to manage health-related needs will improve Outcome: Not Progressing   Problem: Fluid Volume: Goal: Fluid volume balance will be maintained or improved Outcome: Not Progressing   Problem: Nutritional: Goal: Ability to make appropriate dietary choices will improve Outcome: Not Progressing

## 2021-05-18 NOTE — TOC Progression Note (Signed)
Transition of Care [facility]) - Progression Note    Patient Details  Name: Phillip Young MRN: [id number] Date of Birth: August 02, 1985  Transition of Care [facility]) CM/SW Contact  Curlene Labrum, RN Phone Number: 05/18/2021, 1:53 PM  Clinical Narrative:    Case management met with the patient at mother at the bedside to discuss transitions of care.  The patient is agreeable to to go with his family on Wednesday, 05/20/2021.  The patient is able to mobilize safely with PT/OT and the patient has multiple family members to assist with care if needed when he returns home.    The patient has available dme equipment at home including Bipap, oxygen concentrator, oxygen tubing and supplies, bariatric rolling walker and toilet seat.  The patient's mother states that he will have transportation from family via car for transportation for hemodialysis appointments as scheduled.  CM called and updated Melven Sartorius, renal navigator that the patient is agreeable to discharge home on Wednesday and will need return to [facility] dialysis for follow up.  The patient is requesting the HD facility to have an available occlusive dressing to cover his tunneled HD catheter.  CM called and spoke with Wardell Honour, CM with [facility] and updated her that the patient will be discharging home with physical therapy through outpatient rehabilitation through [facility].  CM and MSW will continue to follow the patient for discharge to home on 05/20/2021.   Expected Discharge Plan: IP Rehab Facility Barriers to Discharge: Continued Medical Work up  Expected Discharge Plan and Services Expected Discharge Plan: [place] In-house Referral: Clinical Social Work Discharge Planning Services: CM Consult Post Acute Care Choice: [place] arrangements for the past 2 months: Single Family Home                 DME Arranged:  (Patient states he has home oxygen and Trilogy Bipap  Physicist, medical)  - present at his sister's home) DME Agency: [employer] Date DME Agency Contacted: 04/14/21 Time DME Agency Contacted: [phone] Representative spoke with at DME Agency: Andree Coss             Social Determinants of Health (SDOH) Interventions Food Insecurity Interventions: Assist with [employer] Application Financial Strain Interventions: Other (Comment) (Referral to [facility]) Housing Interventions: Intervention Not Indicated Transportation Interventions: Intervention Not Indicated  Readmission Risk Interventions Readmission Risk Prevention Plan 05/12/2021 10/16/2020 10/16/2020  Transportation Screening Complete Complete Complete  PCP or Specialist Appt within 5-7 Days - Complete Complete  Home Care Screening - Complete -  Medication Review (RN CM) - Referral to Pharmacy -  Medication Review ([place]) Complete - -  PCP or Specialist appointment within 3-5 days of discharge Complete - -  [place] or Home Care Consult Complete - -  SW Recovery Care/Counseling Consult Complete - -  Palliative Care Screening Complete - -  Skilled Nursing Facility Complete - -  Some recent data might be hidden

## 2021-05-18 NOTE — Progress Notes (Signed)
PT Cancellation Note  Patient Details Name: Phillip Young MRN: [id number] DOB: Mar 08, 1986   Cancelled Treatment:    Reason Eval/Treat Not Completed: Other (comment) Met with pt and his mother to discuss d/c plan this morning as RN informed me that he expressed interest in returning home rather than pursuing inpatient rehab. Both the pt and his mother continued to endorse desire to return home to the pt's sister's home as that is a ground floor apt with no stairs. We discussed both HHPT and OPPT options and pt voiced preference for OPPT as he has transportation and feels the machines/tools available at an outpatient setting would be best for him at this time. He also endorsed desire to participate in cardiac rehab.   After discussion, pt declined mobility with PT at this time due to his mother visiting. Will continue to follow and attempt treatment later this afternoon if pt agreeable.  [place], PT, DPT   Acute Rehabilitation Department Pager #: [phone]   Sandra Cockayne 05/18/2021, 12:33 PM

## 2021-05-18 NOTE — Progress Notes (Signed)
[facility] PROGRESS NOTE  Phillip Young YWV:[id number] DOB: 1986/01/13 DOA: 04/12/2021 PCP: [name], MD  Status: Remains inpatient appropriate because:  Continued requirement for IV antibiotics to treat bacteremia with osteomyelitis; significant physical deconditioning that will eventually require SNF for rehab; inpatient hemodialysis-once able to tolerate dialysis in chair will be candidate for outpatient hemodialysis  Barriers to discharge:  Continued requirement for IV antibiotic, SNF/IR bed availability for superobese patient (BMI 64.6), patient lived alone prior to admission and must be able to navigate 6 steps before eventual discharge back to home  Level of care:  Telemetry Cardiac   Code Status: Full Family Communication: Patient only DVT prophylaxis: Eliquis COVID vaccination status: COVID + January 8756   HPI: 35 year old with past medical history significant for severe biventricular systolic heart failure with known ejection fraction less than 20%, diabetes type 2, paroxysmal a flutter on Eliquis, recently started on hemodialysis with recent extensive hospitalization with cardiogenic shock and cardiorenal syndrome progressed to ESRD presented back to the emergency room with shortness of breath, left wrist and right knee pain, unable to ambulate.  Home oxygen concentrator not working.  Chest x-ray showed pulmonary edema, COVID-19 negative.  Patient was found to have MSSA bacteremia due to infected dialysis catheter.  ID and nephrology consulted.  After catheter holiday he received new permanent cath on 9/28 and restarted on hemodialysis.  Subjective: Patient awake and sitting in bed.  Discussed likely upcoming discharge.  Patient felt like it would be better to go home since he did not have a rehab facility yet.  I explained to him that he has been accepted at a rehab facility we are only waiting to ensure appropriate dialysis follow-up before discharge.  He is  agreeable to start going to dialysis in a recliner.  Objective: Vitals:   05/18/21 0330 05/18/21 0932  BP:  (!) 117/52  Pulse:  83  Resp: (!) 21 (!) 22  Temp:  98 F (36.7 C)  SpO2:  97%    Intake/Output Summary (Last 24 hours) at 05/18/2021 1007 Last data filed at 05/17/2021 2200 Gross per 24 hour  Intake 948 ml  Output --  Net 948 ml   Filed Weights   05/17/21 1657  Weight: (!) 175.1 kg    Exam:  Constitutional: NAD, calm Respiratory: Lungs CTA, normal respiratory effort.  3 L nasal cannula oxygen with O2 sats 97% Cardiovascular: Regular rate/rhythm, S1-S2, no extremity edema.  Normotensive  Abdomen: Soft nondistended and nontender, bowel sounds positive. LBM 10/23 Neurologic: CN 2-12 grossly intact. Sensation intact, Strength 4-5/5 x all 4 extremities.  Psychiatric: Alert and oriented x 3. Normal mood.    Assessment/Plan: Acute problems:   MSSA Bacteremia/Left SI septic arthritis, sacrum and iliac bone osteomyelitis/abscess left iliacus muscle, small abscess left piriformis muscle:  Imaging + for osteomyelitis and abscess formation; blood cultures positive for MSSA-FU  blood cxs negative TTE and TEE: no vegetation/endocarditis Perm cath (source of infection) was removed.  ID recommended IV antibiotics for 6 weeks. (LD Nov 6th).  Can dose via HD cath during dialysis therefore no need for line after discharge Continue scheduled oxycontin along with Oxy IR prn. Ortho team deemed too high risk for surgery.  Per IR, abscess small and not amenable to drainage and given expectation patient could regain renal function would like to avoid additional contrast studies  Will require follow-up imaging after discharge   Acute on chronic respiratory failure with hypoxemia 2/2 vol overload and hypercapnia/OSA with suspected OHS:  Resolved with  regular HD Currently on baseline oxygen requirement along with nocturnal NIPPV at home   ESRD on hemodialysis: Tolerating dialysis  through TDC-has not yet had permanent dialysis access placed  Biventricular heart failure with associated and ICM, paroxysmal A. fib, hypotension: Known ICM since 2019 -initially followed at [facility] EF 25 to 30% recent TTE;  EF on TEE 20%.  Given BMI not a candidate for ICD Continue midodrine, Eliquis-volume management with HD  Chronic nausea Continue scheduled oral Zofran with H2 blocker and Low-dose Elavil 10 mg at HS Follow QTC given above medications as well as requirement for amiodarone.  EKG 9/28 QTC was 468 ms.  EKG 10/18 QTC was 466 MS. EKG 10/21 qtc 476 MS  Paroxysmal atrial fibrillation Continue amiodarone   Morbid obesity:  BMI more than 50 with most recent reading 54.3 Prescribed semaglutide prior to admission but pharmacy documents he was not taking.  I suspect medication may be very expensive especially if insurance did not cover. Hemoglobin A1c 6.5   Narcotic induced constipation Continue Colace, Linzess and MiraLAX Continue scheduled lactulose at bedtime   Stage I decubitus ulcer at the medial left gluteal fold:  Wound care per nursing protocol      Data Reviewed: Basic Metabolic Panel: Recent Labs  Lab 05/14/21 1439 05/16/21 0800  NA 130* 129*  K 5.3* 4.4  CL 91* 93*  CO2 27 26  GLUCOSE 113* 99  BUN 46* 33*  CREATININE 8.13* 6.84*  CALCIUM 9.9 10.1  PHOS 6.8* 5.5*   Liver Function Tests: Recent Labs  Lab 05/14/21 1439 05/16/21 0800  ALBUMIN 3.0* 3.1*    CBC: Recent Labs  Lab 05/14/21 1440 05/16/21 0800  WBC 6.1 5.6  HGB 11.0* 10.9*  HCT 35.1* 35.5*  MCV 96.4 95.7  PLT 389 344   Cardiac Enzymes: No results for input(s): CKTOTAL, CKMB, CKMBINDEX, TROPONINI in the last 168 hours. BNP (last 3 results) Recent Labs    11/07/20 0932 02/11/21 1500 04/12/21 1555  BNP 106.0* 194.3* 658.4*      Scheduled Meds:  allopurinol  100 mg Oral Daily   amiodarone  200 mg Oral Daily   amitriptyline  10 mg Oral QHS   apixaban  5 mg Oral BID    bisacodyl  10 mg Rectal Once   Chlorhexidine Gluconate Cloth  6 each Topical Daily   Chlorhexidine Gluconate Cloth  6 each Topical Q0600   darbepoetin (ARANESP) injection - DIALYSIS  100 mcg Intravenous Q Thu-HD   docusate sodium  100 mg Oral BID   famotidine  20 mg Oral BID   feeding supplement  237 mL Oral Q24H   lactulose  10 g Oral QHS   lanthanum  1,000 mg Oral TID WC   linaclotide  145 mcg Oral QAC breakfast   midodrine  20 mg Oral TID WC   multivitamin  1 tablet Oral QHS   oxyCODONE  10 mg Oral Q12H   polyethylene glycol  17 g Oral BID   Continuous Infusions:  anticoagulant sodium citrate     anticoagulant sodium citrate      ceFAZolin (ANCEF) IV Stopped (05/16/21 1835)    Principal Problem:   MSSA bacteremia Active Problems:   Morbid obesity ([place])   GERD without esophagitis   OSA (obstructive sleep apnea)   Chronic systolic CHF (congestive heart failure) ([name])   Paroxysmal atrial flutter ([name])   Hyponatremia   ESRD (end stage renal disease) on dialysis ([place])   Acute on chronic respiratory failure with hypoxia (  [place])   Medical non-compliance   Hypotension   ESRD on dialysis [facility])   Palliative care by specialist   Full code status   Concern about end of life   Consultants: Nephrology Cardiology Orthopedics Pulmonary medicine Infectious disease Interventional radiology  Procedures: TTE TEE Placement [facility]  Antibiotics: Cefazolin 9/23 >>   Time spent: 15 minutes    Erin Hearing ANP  [facility] 7 am - 330 pm/M-F for direct patient care and secure chat Please refer to Amion for contact info 32  days

## 2021-05-18 NOTE — TOC Progression Note (Signed)
Transition of Care [facility]) - Progression Note    Patient Details  Name: Phillip Young MRN: [id number] Date of Birth: November 20, 1985  Transition of Care [facility]) CM/SW [place], RN Phone Number: 05/18/2021, 10:20 AM  Clinical Narrative:    CM spoke with Melven Sartorius, Renal navigator, is working to secure a [employer] dialysis seat at this time with pending lab results at this time.  Wardell Honour, [facility] is aware and will start insurance authorization once an HD seat is secured.  CM and MSW with DTP Team will continue to follow the patient for Inpatient Rehabilitation replacement versus discharge home with home health services.  Patient continues to work with [employer] Team for rehabilitation at the bedside.  Expected Discharge Plan: IP Rehab Facility Barriers to Discharge: Continued Medical Work up  Expected Discharge Plan and Services Expected Discharge Plan: [place] In-house Referral: Clinical Social Work Discharge Planning Services: CM Consult Post Acute Care Choice: [place] arrangements for the past 2 months: Single Family Home                 DME Arranged:  (Patient states he has home oxygen and Trilogy Bipap Physicist, medical)  - present at his sister's home) DME Agency: [employer] Date DME Agency Contacted: 04/14/21 Time DME Agency Contacted: [phone] Representative spoke with at DME Agency: Andree Coss             Social Determinants of Health (SDOH) Interventions Food Insecurity Interventions: Assist with [employer] Application Financial Strain Interventions: Other (Comment) (Referral to [facility]) Housing Interventions: Intervention Not Indicated Transportation Interventions: Intervention Not Indicated  Readmission Risk Interventions Readmission Risk Prevention Plan 05/12/2021 10/16/2020 10/16/2020  Transportation Screening Complete Complete Complete  PCP or Specialist Appt within 5-7 Days - Complete  Complete  Home Care Screening - Complete -  Medication Review (RN CM) - Referral to Pharmacy -  Medication Review ([place]) Complete - -  PCP or Specialist appointment within 3-5 days of discharge Complete - -  [place] or Home Care Consult Complete - -  SW Recovery Care/Counseling Consult Complete - -  Palliative Care Screening Complete - -  Skilled Nursing Facility Complete - -  Some recent data might be hidden

## 2021-05-18 NOTE — Progress Notes (Addendum)
[facility] requesting additional labs in order to accept pt at a clinic while pt at rehab. Contacted providers with request.    Phillip Young Renal Navigator [phone]  Addendum at 2:53 pm: Advised by RN CM that pt will returning home at d/c. Spoke to [facility] with [place] to cancel referral. Spoke to [place], clinic manger, at [place]. Phillip Young advised pt will d/c to home hopefully Wednsday. Phillip Young states that pt will likely need to change to a MWF schedule due to pt's needs and staffing. Navigator to f/u with clinic tomorrow around 10:00 to confirm final plan. Clinic is also checking status of the repair for pt's HD chair that was broken by pt prior to hospital admission. Pt requires a special HD chair due to pt's weight. Phillip Young was also advised of pt's request for special dressing at clinic once treatment is completed. Phillip Young states that clinic will likely not provide this dressing once pt resumes care. Staff confirm pt is able to stand and ambulate. Update provided to RN CM regarding the above. Will follow and assist.

## 2021-05-18 NOTE — Progress Notes (Signed)
NT informed this RN that she found [employer] sandwich wrapper and ketchup in patient's room. Patient declining he ate any food except lemonades all day.

## 2021-05-18 NOTE — Progress Notes (Signed)
Pt mother visiting and another younger male visiting. During vitals check, Rn noticed cash laying in the bed. RN asked patient about it. Pt "shhh'd" RN and states he does not want his mother to know. Patient thumbed through a large stack of cash of an unknown amount. Pt adamant that he is not giving cash to his mother to take with her outside of the hospital, and that he does not want the cash checked into security. Instead, he is insistent that he will have his mother deliver a lock box to attach to the bed. Pt notified that we cannot be held liable if the money disappears from the room, as he does not want it securely check in to [facility] security office. 2C Insurance risk surveyor.

## 2021-05-18 NOTE — Consult Note (Signed)
[place] Nurse Consult Note: Patient receiving care in [place] Reason for Consult: Scratch/abrasion on the buttocks/back Patient states this has healed now and he does not need to see the [place] team.  WOC will sign off but are available to this patient and medical team M-F 8-3 Please re-consult if needed.  Cathlean Marseilles Tamala Julian, MSN, RN, [place], Lysle Pearl, [facility] Wound Treatment Associate Pager [phone]

## 2021-05-19 ENCOUNTER — Other Ambulatory Visit (HOSPITAL_COMMUNITY): Payer: Self-pay

## 2021-05-19 LAB — CBC
HCT: 36 % — ABNORMAL LOW (ref 39.0–52.0)
Hemoglobin: 11.1 g/dL — ABNORMAL LOW (ref 13.0–17.0)
MCH: 29.4 pg (ref 26.0–34.0)
MCHC: 30.8 g/dL (ref 30.0–36.0)
MCV: 95.2 fL (ref 80.0–100.0)
Platelets: 359 10*3/uL (ref 150–400)
RBC: 3.78 MIL/uL — ABNORMAL LOW (ref 4.22–5.81)
RDW: 16.9 % — ABNORMAL HIGH (ref 11.5–15.5)
WBC: 8 10*3/uL (ref 4.0–10.5)
nRBC: 0 % (ref 0.0–0.2)

## 2021-05-19 LAB — RENAL FUNCTION PANEL
Albumin: 3.2 g/dL — ABNORMAL LOW (ref 3.5–5.0)
Anion gap: 11 (ref 5–15)
BUN: 40 mg/dL — ABNORMAL HIGH (ref 6–20)
CO2: 26 mmol/L (ref 22–32)
Calcium: 10.1 mg/dL (ref 8.9–10.3)
Chloride: 91 mmol/L — ABNORMAL LOW (ref 98–111)
Creatinine, Ser: 7.71 mg/dL — ABNORMAL HIGH (ref 0.61–1.24)
GFR, Estimated: 9 mL/min — ABNORMAL LOW (ref >60)
Glucose, Bld: 85 mg/dL (ref 70–99)
Phosphorus: 5.2 mg/dL — ABNORMAL HIGH (ref 2.5–4.6)
Potassium: 4.9 mmol/L (ref 3.5–5.1)
Sodium: 128 mmol/L — ABNORMAL LOW (ref 135–145)

## 2021-05-19 LAB — BUN: BUN: 39 mg/dL — ABNORMAL HIGH (ref 6–20)

## 2021-05-19 MED ORDER — ACETAMINOPHEN 325 MG PO TABS: 650.0000 mg | ORAL_TABLET | Freq: Four times a day (QID) | ORAL | Status: DC | PRN

## 2021-05-19 MED ORDER — SODIUM CHLORIDE 0.9 % IV SOLN: 100.0000 mL | INTRAVENOUS | Status: DC | PRN

## 2021-05-19 MED ORDER — ALLOPURINOL 100 MG PO TABS: 100.0000 mg | ORAL_TABLET | Freq: Every day | ORAL | 0 refills | Status: DC

## 2021-05-19 MED ORDER — DOCUSATE SODIUM 100 MG PO CAPS: 100.0000 mg | ORAL_CAPSULE | Freq: Two times a day (BID) | ORAL | 0 refills | Status: DC

## 2021-05-19 MED ORDER — LIDOCAINE HCL (PF) 1 % IJ SOLN: 5.0000 mL | INTRAMUSCULAR | Status: DC | PRN

## 2021-05-19 MED ORDER — AMIODARONE HCL 200 MG PO TABS: 200.0000 mg | ORAL_TABLET | Freq: Every day | ORAL | 0 refills | Status: DC

## 2021-05-19 MED ORDER — AMITRIPTYLINE HCL 10 MG PO TABS: 10.0000 mg | ORAL_TABLET | Freq: Every day | ORAL | 0 refills | Status: DC

## 2021-05-19 MED ORDER — HEPARIN SODIUM (PORCINE) 1000 UNIT/ML DIALYSIS: 1000.0000 [IU] | INTRAMUSCULAR | Status: DC | PRN

## 2021-05-19 MED ORDER — LACTULOSE 10 GM/15ML PO SOLN: 10.0000 g | Freq: Every day | ORAL | 0 refills | Status: DC

## 2021-05-19 MED ORDER — OXYCODONE HCL 10 MG PO TABS: 10.0000 mg | ORAL_TABLET | Freq: Four times a day (QID) | ORAL | 0 refills | Status: DC | PRN

## 2021-05-19 MED ORDER — PENTAFLUOROPROP-TETRAFLUOROETH EX AERO: 1.0000 "application " | INHALATION_SPRAY | CUTANEOUS | Status: DC | PRN

## 2021-05-19 MED ORDER — MIDODRINE HCL 10 MG PO TABS: 20.0000 mg | ORAL_TABLET | Freq: Three times a day (TID) | ORAL | 0 refills | Status: DC

## 2021-05-19 MED ORDER — LINACLOTIDE 145 MCG PO CAPS: 145.0000 ug | ORAL_CAPSULE | Freq: Every day | ORAL | 0 refills | Status: DC

## 2021-05-19 MED ORDER — FAMOTIDINE 20 MG PO TABS: 20.0000 mg | ORAL_TABLET | Freq: Two times a day (BID) | ORAL | 0 refills | Status: DC

## 2021-05-19 MED ORDER — LANTHANUM CARBONATE 1000 MG PO CHEW: 1000.0000 mg | CHEWABLE_TABLET | Freq: Three times a day (TID) | ORAL | 0 refills | Status: DC

## 2021-05-19 MED ORDER — CYCLOBENZAPRINE HCL 5 MG PO TABS: 5.0000 mg | ORAL_TABLET | Freq: Three times a day (TID) | ORAL | 0 refills | Status: DC | PRN

## 2021-05-19 MED ORDER — RENA-VITE PO TABS: 1.0000 | ORAL_TABLET | Freq: Every day | ORAL | 0 refills | Status: DC

## 2021-05-19 MED ORDER — POLYETHYLENE GLYCOL 3350 17 G PO PACK: 17.0000 g | PACK | Freq: Two times a day (BID) | ORAL | 0 refills | Status: DC

## 2021-05-19 MED ORDER — ALTEPLASE 2 MG IJ SOLR
2.0000 mg | Freq: Once | INTRAMUSCULAR | Status: DC | PRN
  Filled 2021-05-19: qty 2

## 2021-05-19 MED ORDER — LIDOCAINE-PRILOCAINE 2.5-2.5 % EX CREA: 1.0000 "application " | TOPICAL_CREAM | CUTANEOUS | Status: DC | PRN

## 2021-05-19 MED ORDER — MORPHINE SULFATE ER 15 MG PO TBCR: 15.0000 mg | EXTENDED_RELEASE_TABLET | Freq: Two times a day (BID) | ORAL | 0 refills | Status: DC

## 2021-05-19 NOTE — TOC Transition Note (Addendum)
Transition of Care [facility]) - CM/SW Discharge Note   Patient Details  Name: Phillip Young MRN: [id number] Date of Birth: [birth year]  Transition of Care [facility]) CM/SW Contact:  Curlene Labrum, RN Phone Number: 05/19/2021, 11:04 AM   Clinical Narrative:    CM spoke with Erin Hearing, NP and the patient will be discharging home today by car and care by family supports.  Melven Sartorius, renal navigator has set patient up for HD services at the [facility] facility and this information will be documented in the patient's AVS.  CM spoke with Erin Hearing, NP and discharge medications were called into the [facility].  I spoke with the patient at the bedside and patient is aware that his dialysis treatments have been set up by Melven Sartorius, renal navigator and that his HD seat time is noted in the discharge instructions.    Patient has all available dme at home for care.  The patient states that he will call his family to pick up up for transport to home.  Instructions are noted to follow up with [facility] therapy as instructed.  [place] was updated that home health services will not be needed regarding physical rehab and nursing services.  Bedside nursing to discharge the patient to home once his family arrives for transport to home.     Final next level of care: OP Rehab Barriers to Discharge: No Barriers Identified   Patient Goals and CMS Choice Patient states their goals for this hospitalization and ongoing recovery are:: Patient is agreeable to discharging home with family CMS Medicare.gov Compare Post Acute Care list provided to:: Patient Choice offered to / list presented to : Patient  Discharge Placement                       Discharge Plan and Services In-house Referral: Clinical Social Work Discharge Planning Services: CM Consult Post Acute Care Choice: Resumption of Svcs/PTA Provider          DME Arranged:  (Patient states he has  home oxygen and Trilogy Bipap ([place])  - present at his sister's home) DME Agency: [employer] Date DME Agency Contacted: 04/14/21 Time DME Agency Contacted: [phone] Representative spoke with at DME Agency: Andree Coss     Date [place]: 05/19/21 Time [place]: 24 Representative spoke with at [place]: [place] spoke with Hoyle Sauer, [place] with [facility] and updated that patient will not need home health services at patient's request.  Social Determinants of Health (SDOH) Interventions Food Insecurity Interventions: Assist with SNAP Application Financial Strain Interventions: Other (Comment) (Referral to [facility]) Housing Interventions: Intervention Not Indicated Transportation Interventions: Intervention Not Indicated   Readmission Risk Interventions Readmission Risk Prevention Plan 05/12/2021 10/16/2020 10/16/2020  Transportation Screening Complete Complete Complete  PCP or Specialist Appt within 5-7 Days - Complete Complete  Home Care Screening - Complete -  Medication Review (RN CM) - Referral to Pharmacy -  Medication Review ([place]) Complete - -  PCP or Specialist appointment within 3-5 days of discharge Complete - -  [place] or Home Care Consult Complete - -  SW Recovery Care/Counseling Consult Complete - -  Palliative Care Screening Complete - -  Skilled Nursing Facility Complete - -  Some recent data might be hidden

## 2021-05-19 NOTE — Progress Notes (Signed)
PT Cancellation Note  Patient Details Name: Phillip Young MRN: [id number] DOB: 1985-09-28   Cancelled Treatment:    Reason Eval/Treat Not Completed: (P) Patient declined, no reason specified Pt refused PT/OT services after HD, stating he is going home and does not want to be tired.   Alaysia Lightle B. Migdalia Dk PT, DPT Acute Rehabilitation Services Pager [phone] Office [phone]    [place] 05/19/2021, 3:34 PM

## 2021-05-19 NOTE — Progress Notes (Signed)
PT Cancellation Note  Patient Details Name: Phillip Young MRN: [id number] DOB: 08-06-1985   Cancelled Treatment:    Reason Eval/Treat Not Completed: (P) Patient at procedure or test/unavailable Pt is off floor for HD. PT will follow back this afternoon for treatment as able.  Regnia Mathwig B. Migdalia Dk PT, DPT Acute Rehabilitation Services Pager [phone] Office [phone]  [place] 05/19/2021, 8:45 AM

## 2021-05-19 NOTE — Progress Notes (Signed)
OT Cancellation Note  Patient Details Name: Bashir Marchetti MRN: [id number] DOB: [birth year]   Cancelled Treatment:    Reason Eval/Treat Not Completed: Patient declined, no reason specified (Patient recently returned from HD and stated he is going home today and wanted to safe his strength for getting home.) Lodema Hong, [place]  Pager [phone] Office [place] 05/19/2021, 2:54 PM

## 2021-05-19 NOTE — Progress Notes (Addendum)
Spoke to [place], [place] manager, at [employer]. Pt will need to resume out-pt HD on a MWF schedule. Pt will need to arrive at 11:30 for 11:50 chair time. HD chair that pt uses at clinic is to be repaired this afternoon. Clinic aware pt for d/c today and will resume care tomorrow. Clinic also advised of need for iv abx with HD until 11/6 (per medical record). Clinic agreeable to plan. Pt currently in HD. HD arrangements added to AVS due to changes. Providers provided an update regarding the above information.   Melven Sartorius Renal Navigator [phone]  Addendum at 2:19 pm: Attempted to reach pt via phone (room phone and cell phone) to confirm change in out-pt HD days/time. Unable to reach pt. Message left for pt on cell phone with details.

## 2021-05-19 NOTE — Progress Notes (Signed)
[facility] NEPHROLOGY PROGRESS NOTE  Assessment/ Plan:  Dialysis Orders: TTS/ AKI [facility] 4h  400/800    188.5kg   2/2 bath  TDC RIJ  Hep none  - no in center meds  # MSSA bacteremia: Blood Cx 9/22 + and catheter exit site purulence. S/p [name] removal/line holiday 9/24 -> 9/28 new TDC placed. Has assoc left-sided SI joint septic athritis w/ local osteo and 2 associated small abscesses. Per ortho too high risk for surgical Rx. Per IR abscesses were small and not amenable to drainage.  S/p TEE 10/11-neg for endocarditis. On IV Cefazolin x 6 week course (thru 11/5). Was bed bound but is now getting up w/ PT.   #BP/volume: Chronic hypotension on mido 20mg  TID, volume has come down while here, no edema or vol excess by exam. UF has been around 2.5- 3L here. ^'d fluid restriction to 1500 cc /d per pt request.   # ESRD: TTS HD.  Had been refusing HD off and on. HD today and has been tolerating well.  Avoid heparin because of history of HIT.  # Biventricular congestive heart failure: Not a candidate for LVAD or transplant, per CHF team notes.  # Atrial fibrillation: On amiodarone + Eliquis.  History of HIT.  # CKD MBD: phos has improved on Fosrenol as binder. PTH low (42). High Ca's probably due to immobility. Cont 2Ca bath w/ HD and no vdra. Corr Ca stable in 10-11 range.   # Anemia of chronic kidney disease: Hgb 10-12 range,  reduced weekly Aranesp dose from 150 to 100.  #Hyponatremia, hypervolemic: Managed with dialysis.  Discussed about fluid restriction.  # Dispo/ debility - difficult SNF placement due to super morbid obesity. Mobility been improving, working w/ PT.   Subjective: Seen and examined.  Tolerating dialysis well.  Denies nausea vomiting chest pain shortness of breath. Objective Vital signs in last 24 hours: Vitals:   05/19/21 0817 05/19/21 0830 05/19/21 0900 05/19/21 0930  BP: (!) 103/49 (!) 101/57 (!) 102/55 113/63  Pulse: 77 75 74 71  Resp:    (!) 24   Temp:      TempSrc:      SpO2:      Weight:      Height:       Weight change: 0.181 kg  Intake/Output Summary (Last 24 hours) at 05/19/2021 0953 Last data filed at 05/18/2021 1851 Gross per 24 hour  Intake 472 ml  Output 275 ml  Net 197 ml        Labs: Basic Metabolic Panel: Recent Labs  Lab 05/14/21 1439 05/16/21 0800 05/18/21 1016 05/19/21 0819  NA 130* 129* 131* 128*  K 5.3* 4.4 4.9 4.9  CL 91* 93* 94* 91*  CO2 27 26 21* 26  GLUCOSE 113* 99 97 85  BUN 46* 33* 30* 40*  CREATININE 8.13* 6.84* 6.75* 7.71*  CALCIUM 9.9 10.1 10.4* 10.1  PHOS 6.8* 5.5*  --  5.2*    Liver Function Tests: Recent Labs  Lab 05/18/21 0935 05/18/21 1016 05/19/21 0819  AST 23 24  --   ALT 6 6  --   ALKPHOS 89 90  --   BILITOT 0.7 0.7  --   PROT 8.4* 8.5*  --   ALBUMIN 3.2* 3.2* 3.2*    No results for input(s): LIPASE, AMYLASE in the last 168 hours. No results for input(s): AMMONIA in the last 168 hours. CBC: Recent Labs  Lab 05/14/21 1440 05/16/21 0800 05/19/21 7939  WBC 6.1 5.6 8.0  HGB 11.0* 10.9* 11.1*  HCT 35.1* 35.5* 36.0*  MCV 96.4 95.7 95.2  PLT 389 344 359    Cardiac Enzymes: No results for input(s): CKTOTAL, CKMB, CKMBINDEX, TROPONINI in the last 168 hours. CBG: No results for input(s): GLUCAP in the last 168 hours.  Iron Studies: No results for input(s): IRON, TIBC, TRANSFERRIN, FERRITIN in the last 72 hours. Studies/Results: No results found.  Medications: Infusions:  sodium chloride     sodium chloride     anticoagulant sodium citrate     anticoagulant sodium citrate      ceFAZolin (ANCEF) IV Stopped (05/16/21 1835)    Scheduled Medications:  allopurinol  100 mg Oral Daily   amiodarone  200 mg Oral Daily   amitriptyline  10 mg Oral QHS   apixaban  5 mg Oral BID   bisacodyl  10 mg Rectal Once   Chlorhexidine Gluconate Cloth  6 each Topical Daily   Chlorhexidine Gluconate Cloth  6 each Topical Q0600   darbepoetin (ARANESP) injection -  DIALYSIS  100 mcg Intravenous Q Thu-HD   docusate sodium  100 mg Oral BID   famotidine  20 mg Oral BID   feeding supplement  237 mL Oral Q24H   lactulose  10 g Oral QHS   lanthanum  1,000 mg Oral TID WC   linaclotide  145 mcg Oral QAC breakfast   midodrine  20 mg Oral TID WC   multivitamin  1 tablet Oral QHS   oxyCODONE  10 mg Oral Q12H   polyethylene glycol  17 g Oral BID    have reviewed scheduled and prn medications.  Physical Exam: General:NAD, able to lie flat and receiving dialysis. Heart:RRR, s1s2 nl Lungs:clear b/l, no crackle Abdomen:soft, Non-tender, Extremities:No edema Dialysis Access: TDC in place.  Eligha Kmetz Prasad Etta Gassett 05/19/2021,9:53 AM  LOS: 33 days

## 2021-05-20 ENCOUNTER — Emergency Department (HOSPITAL_COMMUNITY)
Admission: EM | Admit: 2021-05-20 | Discharge: 2021-05-20 | Disposition: A | Discharge status: Home / Self Care | Payer: BC Managed Care – PPO | Attending: Emergency Medicine | Admitting: Emergency Medicine

## 2021-05-20 DIAGNOSIS — T8249XD Other complication of vascular dialysis catheter, subsequent encounter: Secondary | ICD-10-CM | POA: Diagnosis not present

## 2021-05-20 DIAGNOSIS — N186 End stage renal disease: Secondary | ICD-10-CM | POA: Diagnosis not present

## 2021-05-20 DIAGNOSIS — N2581 Secondary hyperparathyroidism of renal origin: Secondary | ICD-10-CM | POA: Diagnosis not present

## 2021-05-20 DIAGNOSIS — Z5321 Procedure and treatment not carried out due to patient leaving prior to being seen by health care provider: Secondary | ICD-10-CM | POA: Insufficient documentation

## 2021-05-20 DIAGNOSIS — N179 Acute kidney failure, unspecified: Secondary | ICD-10-CM | POA: Diagnosis not present

## 2021-05-20 DIAGNOSIS — B9561 Methicillin susceptible Staphylococcus aureus infection as the cause of diseases classified elsewhere: Secondary | ICD-10-CM | POA: Diagnosis not present

## 2021-05-20 DIAGNOSIS — Z992 Dependence on renal dialysis: Secondary | ICD-10-CM | POA: Diagnosis not present

## 2021-05-20 NOTE — ED Notes (Signed)
Patient left without being seen due to wait times

## 2021-05-22 DIAGNOSIS — B9561 Methicillin susceptible Staphylococcus aureus infection as the cause of diseases classified elsewhere: Secondary | ICD-10-CM | POA: Diagnosis not present

## 2021-05-22 DIAGNOSIS — N2581 Secondary hyperparathyroidism of renal origin: Secondary | ICD-10-CM | POA: Diagnosis not present

## 2021-05-22 DIAGNOSIS — T8249XD Other complication of vascular dialysis catheter, subsequent encounter: Secondary | ICD-10-CM | POA: Diagnosis not present

## 2021-05-22 DIAGNOSIS — N186 End stage renal disease: Secondary | ICD-10-CM | POA: Diagnosis not present

## 2021-05-22 DIAGNOSIS — Z992 Dependence on renal dialysis: Secondary | ICD-10-CM | POA: Diagnosis not present

## 2021-05-25 DIAGNOSIS — B9561 Methicillin susceptible Staphylococcus aureus infection as the cause of diseases classified elsewhere: Secondary | ICD-10-CM | POA: Diagnosis not present

## 2021-05-25 DIAGNOSIS — N2581 Secondary hyperparathyroidism of renal origin: Secondary | ICD-10-CM | POA: Diagnosis not present

## 2021-05-25 DIAGNOSIS — Z992 Dependence on renal dialysis: Secondary | ICD-10-CM | POA: Diagnosis not present

## 2021-05-25 DIAGNOSIS — N186 End stage renal disease: Secondary | ICD-10-CM | POA: Diagnosis not present

## 2021-05-25 DIAGNOSIS — T8249XD Other complication of vascular dialysis catheter, subsequent encounter: Secondary | ICD-10-CM | POA: Diagnosis not present

## 2021-05-26 ENCOUNTER — Other Ambulatory Visit: Payer: Self-pay

## 2021-05-26 ENCOUNTER — Encounter: Payer: Self-pay | Admitting: Internal Medicine

## 2021-05-26 ENCOUNTER — Ambulatory Visit (INDEPENDENT_AMBULATORY_CARE_PROVIDER_SITE_OTHER): Payer: BC Managed Care – PPO | Admitting: Internal Medicine

## 2021-05-26 VITALS — BP 162/85 | HR 85 | Temp 97.8°F | Resp 16 | Ht 71.0 in | Wt 393.0 lb

## 2021-05-26 DIAGNOSIS — R7881 Bacteremia: Secondary | ICD-10-CM | POA: Diagnosis not present

## 2021-05-26 DIAGNOSIS — M0000 Staphylococcal arthritis, unspecified joint: Secondary | ICD-10-CM | POA: Diagnosis not present

## 2021-05-26 DIAGNOSIS — I5023 Acute on chronic systolic (congestive) heart failure: Secondary | ICD-10-CM | POA: Diagnosis not present

## 2021-05-26 DIAGNOSIS — M4620 Osteomyelitis of vertebra, site unspecified: Secondary | ICD-10-CM | POA: Diagnosis not present

## 2021-05-26 DIAGNOSIS — M8600 Acute hematogenous osteomyelitis, unspecified site: Secondary | ICD-10-CM | POA: Diagnosis not present

## 2021-05-26 DIAGNOSIS — B9561 Methicillin susceptible Staphylococcus aureus infection as the cause of diseases classified elsewhere: Secondary | ICD-10-CM

## 2021-05-26 DIAGNOSIS — I5082 Biventricular heart failure: Secondary | ICD-10-CM | POA: Diagnosis not present

## 2021-05-26 NOTE — Progress Notes (Signed)
[name] for Infectious Disease  Patient Active Problem List   Diagnosis Date Noted   ESRD on dialysis [facility])    Palliative care by specialist    Full code status    Concern about end of life    Acute on chronic respiratory failure with hypoxia ([name]) 04/21/2021   Medical non-compliance 04/21/2021   Hypotension 04/21/2021   MSSA bacteremia    ESRD (end stage renal disease) on dialysis ([name]) 04/16/2021   ESRD (end stage renal disease) ([name]) 04/12/2021   Paroxysmal atrial flutter ([name]) 04/12/2021   Acute respiratory failure with hypoxia ([name]) 04/12/2021   Hyponatremia 04/12/2021   Dyspnea    Cardiogenic shock ([name])    Atrial flutter with rapid ventricular response ([name]) 02/11/2021   Elevated troponin 02/11/2021   Chronic respiratory failure with hypoxia ([name]) 02/11/2021   CAP (community acquired pneumonia) 87/56/4332   Chronic systolic CHF (congestive heart failure) ([name])    Hypokalemia    CHF (congestive heart failure) ([name])    Chronic cough    Acute on chronic systolic (congestive) heart failure ([name]) 02/26/2020   Morbid obesity ([name]) 02/26/2020   GERD without esophagitis 02/26/2020   Diuretic-induced hypokalemia 02/26/2020   OSA (obstructive sleep apnea) 02/26/2020   Hidradenitis suppurativa 02/26/2020   Prediabetes 02/26/2020      Subjective:    Patient ID: Phillip Young, male    DOB: 1986-02-21, 35 y.o.   MRN: [id number]  Chief Complaint  Patient presents with   New Patient (Initial Visit)    MSSA bacteremia -     HPI:  Phillip Young is a 35 y.o. male morbidly obese, aflutter, lv dysfunction, esrd here for follow up mssa septicemia admission that was complicated by paraspinal abscess/left sacral om/left si joint septic arthritis  Patient had old lines removed 9/24 and new line placed 9/28. He was discharged on planned 6 weeks iv abx cefazolin with dialysis  Mri left hip showed an elongated abscess on his left iliacus muscle as well; no size  given.  ------------- 11/01 id f/u Doing much better. Immobilizing pain on admission now able to walk without cane/fww but not baseline and pain left hip worse with prolonged standing/walking/sitting No f/c/n/v/rash Does have watery diarrhea worse with eating.  Good apetite and feels well  He is concerned about his dialysis line not taken care of well during dialysis as the hub is frequently exposed  Reviewed labs from dialysis center; no lft/crp; 10/27 cbc 4/12/379    Allergies  Allergen Reactions   Coreg [Carvedilol] Shortness Of Breath and Diarrhea    Wheezing    Heparin Other (See Comments)    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, SRA positive      Outpatient Medications Prior to Visit  Medication Sig Dispense Refill   allopurinol (ZYLOPRIM) 100 MG tablet Take 1 tablet (100 mg total) by mouth daily. 30 tablet 0   amiodarone (PACERONE) 200 MG tablet Take 1 tablet (200 mg total) by mouth daily. 30 tablet 0   amitriptyline (ELAVIL) 10 MG tablet Take 1 tablet (10 mg total) by mouth at bedtime. 30 tablet 0   apixaban (ELIQUIS) 5 MG TABS tablet Take 1 tablet (5 mg total) by mouth 2 (two) times daily. 60 tablet 11   ceFAZolin (ANCEF) 2-4 GM/100ML-% IVPB Inject 100 mLs (2 g total) into the vein every Tuesday, Thursday, and Saturday at 6 PM. 1 each    ceFAZolin (ANCEF) IVPB Inject 2 g into the vein Every Tuesday,Thursday,and Saturday  with dialysis for 17 days. Indication:  MSSA Bacteremia First Dose: Yes Last Day of Therapy:  05/31/2021 Labs - Once weekly:  CBC/D and BMP, Labs - Every other week:  ESR and CRP Method of administration: IV Push Method of administration may be changed at the discretion of home infusion pharmacist based upon assessment of the patient and/or caregiver's ability to self-administer the medication ordered. 8 Units 0   cyclobenzaprine (FLEXERIL) 5 MG tablet Take 1 tablet (5 mg total) by mouth 3 (three) times daily as needed for muscle spasms. 30 tablet 0    docusate sodium (COLACE) 100 MG capsule Take 1 capsule (100 mg total) by mouth 2 (two) times daily. 10 capsule 0   famotidine (PEPCID) 20 MG tablet Take 1 tablet (20 mg total) by mouth 2 (two) times daily. 60 tablet 0   lactulose (CHRONULAC) 10 GM/15ML solution Take 15 mLs (10 g total) by mouth at bedtime. 236 mL 0   lanthanum (FOSRENOL) 1000 MG chewable tablet Chew 1 tablet (1,000 mg total) by mouth 3 (three) times daily with meals. 90 tablet 0   linaclotide (LINZESS) 145 MCG CAPS capsule Take 1 capsule (145 mcg total) by mouth daily before breakfast. 30 capsule 0   midodrine (PROAMATINE) 10 MG tablet Take 2 tablets (20 mg total) by mouth 3 (three) times daily with meals. 90 tablet 0   morphine (MS CONTIN) 15 MG 12 hr tablet Take 1 tablet (15 mg total) by mouth every 12 (twelve) hours. 60 tablet 0   multivitamin (RENA-VIT) TABS tablet Take 1 tablet by mouth at bedtime. 30 tablet 0   Oxycodone HCl 10 MG TABS Take 1 tablet (10 mg total) by mouth every 6 (six) hours as needed. 30 tablet 0   polyethylene glycol (MIRALAX / GLYCOLAX) 17 g packet Take 17 g by mouth 2 (two) times daily. 14 each 0   sodium chloride (OCEAN) 0.65 % SOLN nasal spray Place 1 spray into both nostrils as needed for congestion.  0   acetaminophen (TYLENOL) 325 MG tablet Take 2 tablets (650 mg total) by mouth every 6 (six) hours as needed for mild pain (or Fever >/= 101). (Patient not taking: Reported on 05/26/2021)     Darbepoetin Alfa (ARANESP) 100 MCG/0.5ML SOSY injection Inject 0.5 mLs (100 mcg total) into the vein every Thursday with hemodialysis. (Patient not taking: Reported on 05/26/2021) 4.2 mL    ondansetron (ZOFRAN) 4 MG/2ML SOLN injection Inject 2 mLs (4 mg total) into the vein every 6 (six) hours as needed for nausea or vomiting. (Patient not taking: Reported on 05/26/2021) 2 mL 0   No facility-administered medications prior to visit.     Social History   Socioeconomic History   Marital status: Single    Spouse  name: Not on file   Number of children: Not on file   Years of education: Not on file   Highest education level: Not on file  Occupational History   Not on file  Tobacco Use   Smoking status: Former    Packs/day: 1.00    Types: Cigarettes    Quit date: 2019    Years since quitting: 3.8   Smokeless tobacco: Former   Tobacco comments:    quit in 2019  Substance and Sexual Activity   Alcohol use: Never   Drug use: Never   Sexual activity: Not on file  Other Topics Concern   Not on file  Social History Narrative   Not on file   Social Determinants  of Health   Financial Resource Strain: Low Risk    Difficulty of Paying Living Expenses: Not very hard  Food Insecurity: Food Insecurity Present   Worried About [place] in the Last Year: Never true   Ran Out of Food in the Last Year: Sometimes true  Transportation Needs: No Transportation Needs   Lack of Transportation (Medical): No   Lack of Transportation (Non-Medical): No  Physical Activity: Not on file  Stress: Not on file  Social Connections: Not on file  Intimate Partner Violence: Not on file      Review of Systems     Objective:    Wt (!) 393 lb (178.3 kg)   BMI 54.81 kg/m  Nursing note and vital signs reviewed.  Physical Exam     General/constitutional: no distress, pleasant; obese HEENT: Normocephalic, PER, Conj Clear, EOMI, Oropharynx clear Neck supple CV: rrr no mrg Lungs: clear to auscultation, normal respiratory effort Abd: Soft, Nontender Ext: no edema Skin: No Rash Neuro; nonfocal Msk: ginger gait; intact active rom bilateral hip joint  Central line presence: right chest hd line the tape had come off. Insertion site no purulence/tenderness/erythema         Labs:  Micro:  Serology:  Imaging: 10/05 mri left hip 1. MR findings consistent with septic arthritis involving the left SI joint. Associated osteomyelitis involving the sacrum and iliac bone. 2. Elongated abscess  involving the left iliacus muscle. 3. Small abscess involving the left piriformis muscle and surrounding myofasciitis. 4. No findings for septic arthritis or osteomyelitis involving the hips.   Assessment & Plan:   Problem List Items Addressed This Visit       Other   MSSA bacteremia - Primary   Other Visit Diagnoses     Staphylococcal arthritis, septic arthritis of unspecified location [facility])       Paraspinal abscess ([place])       Acute hematogenous osteomyelitis, unspecified site ([place])             No orders of the defined types were placed in this encounter.  Lines: 9/28 new dialysis line placed right tunneled ij; old line removed 9/24  Abx: 9/23-c cefazolin   ASSESSMENT: Mssa bacteremia Left si joint/sacral osteomyelitis Iliacus muscle abscess Esrd on iHD via previously right chest hd catheter LV dysfunction  35 yo male esrd admitted 9/23 with sepsis/mssa bacteremia due to clabsi. He has multiple complications as above  1/44 bcx cleared; 9/22 bcx positive  Left wrist/right knee don't appear to have septic joint picture on exam and resolved during admission; no mri imaging done  Left hip pain, ultimately had mri left hip that showed left si joint septic arthritis and sacral OM along with left iliacus muscle abscess  10/11 tee no evidence endocarditis; ef 20%. Cardiology awared -- at this time due to obesity not candidate for lvad or transplant   He has planned 6 weeks iv abx until 11/06. However given muscle abscess, will continue for another 4 weeks cefazolin with dialysis and reassess  ------------ 11/01 assesssment Diarrhea abx asociated; clinically doesn't appear to be cdiff Concern for central line hygiene reviewed -- no clinical sign of insertion site infection at this time Clinically improving but extending tx due to abscess present and need for repeat mri    -mri left hip noncontrast -continue cefazolin 2/2/2 with dialysis until 12/04 -follow up  in 4 weeks -advise patient to discuss with his kidney doctor about hygiene/care of his HD line  Follow-up: Return in about 4 weeks (around 06/23/2021).      Jabier Mut[place], [place] [facility] [place] [phone]  pager   [phone] cell 05/26/2021, 10:06 AM

## 2021-05-26 NOTE — Patient Instructions (Signed)
Continue cefazolin with dialysis until 12/04  See [place] in around 4 weeks  Discuss with your nephrologist (kidney doctor) about concern for catheter line cleaning/hygiene  Will order mri of your left hip to see what the abscess is doing (improving vs not)

## 2021-05-27 ENCOUNTER — Encounter (HOSPITAL_BASED_OUTPATIENT_CLINIC_OR_DEPARTMENT_OTHER): Payer: BC Managed Care – PPO | Admitting: Cardiology

## 2021-05-27 DIAGNOSIS — N2581 Secondary hyperparathyroidism of renal origin: Secondary | ICD-10-CM | POA: Diagnosis not present

## 2021-05-27 DIAGNOSIS — T8249XD Other complication of vascular dialysis catheter, subsequent encounter: Secondary | ICD-10-CM | POA: Diagnosis not present

## 2021-05-27 DIAGNOSIS — B9561 Methicillin susceptible Staphylococcus aureus infection as the cause of diseases classified elsewhere: Secondary | ICD-10-CM | POA: Diagnosis not present

## 2021-05-27 DIAGNOSIS — N186 End stage renal disease: Secondary | ICD-10-CM | POA: Diagnosis not present

## 2021-05-27 DIAGNOSIS — Z992 Dependence on renal dialysis: Secondary | ICD-10-CM | POA: Diagnosis not present

## 2021-05-28 ENCOUNTER — Institutional Professional Consult (permissible substitution): Payer: BC Managed Care – PPO | Admitting: Pulmonary Disease

## 2021-05-29 DIAGNOSIS — B9561 Methicillin susceptible Staphylococcus aureus infection as the cause of diseases classified elsewhere: Secondary | ICD-10-CM | POA: Diagnosis not present

## 2021-05-29 DIAGNOSIS — Z992 Dependence on renal dialysis: Secondary | ICD-10-CM | POA: Diagnosis not present

## 2021-05-29 DIAGNOSIS — N186 End stage renal disease: Secondary | ICD-10-CM | POA: Diagnosis not present

## 2021-05-29 DIAGNOSIS — T8249XD Other complication of vascular dialysis catheter, subsequent encounter: Secondary | ICD-10-CM | POA: Diagnosis not present

## 2021-05-29 DIAGNOSIS — N2581 Secondary hyperparathyroidism of renal origin: Secondary | ICD-10-CM | POA: Diagnosis not present

## 2021-06-01 DIAGNOSIS — N186 End stage renal disease: Secondary | ICD-10-CM | POA: Diagnosis not present

## 2021-06-01 DIAGNOSIS — T8249XD Other complication of vascular dialysis catheter, subsequent encounter: Secondary | ICD-10-CM | POA: Diagnosis not present

## 2021-06-01 DIAGNOSIS — N2581 Secondary hyperparathyroidism of renal origin: Secondary | ICD-10-CM | POA: Diagnosis not present

## 2021-06-01 DIAGNOSIS — A4901 Methicillin susceptible Staphylococcus aureus infection, unspecified site: Secondary | ICD-10-CM | POA: Diagnosis not present

## 2021-06-01 DIAGNOSIS — Z992 Dependence on renal dialysis: Secondary | ICD-10-CM | POA: Diagnosis not present

## 2021-06-02 ENCOUNTER — Encounter: Payer: BC Managed Care – PPO | Admitting: Student

## 2021-06-03 DIAGNOSIS — T8249XD Other complication of vascular dialysis catheter, subsequent encounter: Secondary | ICD-10-CM | POA: Diagnosis not present

## 2021-06-03 DIAGNOSIS — A4901 Methicillin susceptible Staphylococcus aureus infection, unspecified site: Secondary | ICD-10-CM | POA: Diagnosis not present

## 2021-06-03 DIAGNOSIS — N2581 Secondary hyperparathyroidism of renal origin: Secondary | ICD-10-CM | POA: Diagnosis not present

## 2021-06-03 DIAGNOSIS — Z992 Dependence on renal dialysis: Secondary | ICD-10-CM | POA: Diagnosis not present

## 2021-06-03 DIAGNOSIS — N186 End stage renal disease: Secondary | ICD-10-CM | POA: Diagnosis not present

## 2021-06-04 ENCOUNTER — Other Ambulatory Visit: Payer: Self-pay

## 2021-06-04 DIAGNOSIS — N186 End stage renal disease: Secondary | ICD-10-CM

## 2021-06-05 DIAGNOSIS — N186 End stage renal disease: Secondary | ICD-10-CM | POA: Diagnosis not present

## 2021-06-05 DIAGNOSIS — N2581 Secondary hyperparathyroidism of renal origin: Secondary | ICD-10-CM | POA: Diagnosis not present

## 2021-06-05 DIAGNOSIS — A4901 Methicillin susceptible Staphylococcus aureus infection, unspecified site: Secondary | ICD-10-CM | POA: Diagnosis not present

## 2021-06-05 DIAGNOSIS — Z992 Dependence on renal dialysis: Secondary | ICD-10-CM | POA: Diagnosis not present

## 2021-06-05 DIAGNOSIS — T8249XD Other complication of vascular dialysis catheter, subsequent encounter: Secondary | ICD-10-CM | POA: Diagnosis not present

## 2021-06-07 DIAGNOSIS — I509 Heart failure, unspecified: Secondary | ICD-10-CM | POA: Diagnosis not present

## 2021-06-07 DIAGNOSIS — I5023 Acute on chronic systolic (congestive) heart failure: Secondary | ICD-10-CM | POA: Diagnosis not present

## 2021-06-07 DIAGNOSIS — J9610 Chronic respiratory failure, unspecified whether with hypoxia or hypercapnia: Secondary | ICD-10-CM | POA: Diagnosis not present

## 2021-06-07 DIAGNOSIS — E662 Morbid (severe) obesity with alveolar hypoventilation: Secondary | ICD-10-CM | POA: Diagnosis not present

## 2021-06-08 DIAGNOSIS — Z992 Dependence on renal dialysis: Secondary | ICD-10-CM | POA: Diagnosis not present

## 2021-06-08 DIAGNOSIS — A4901 Methicillin susceptible Staphylococcus aureus infection, unspecified site: Secondary | ICD-10-CM | POA: Diagnosis not present

## 2021-06-08 DIAGNOSIS — T8249XD Other complication of vascular dialysis catheter, subsequent encounter: Secondary | ICD-10-CM | POA: Diagnosis not present

## 2021-06-08 DIAGNOSIS — Z23 Encounter for immunization: Secondary | ICD-10-CM | POA: Diagnosis not present

## 2021-06-08 DIAGNOSIS — N2581 Secondary hyperparathyroidism of renal origin: Secondary | ICD-10-CM | POA: Diagnosis not present

## 2021-06-08 DIAGNOSIS — N186 End stage renal disease: Secondary | ICD-10-CM | POA: Diagnosis not present

## 2021-06-10 DIAGNOSIS — Z992 Dependence on renal dialysis: Secondary | ICD-10-CM | POA: Diagnosis not present

## 2021-06-10 DIAGNOSIS — Z23 Encounter for immunization: Secondary | ICD-10-CM | POA: Diagnosis not present

## 2021-06-10 DIAGNOSIS — T8249XD Other complication of vascular dialysis catheter, subsequent encounter: Secondary | ICD-10-CM | POA: Diagnosis not present

## 2021-06-10 DIAGNOSIS — A4901 Methicillin susceptible Staphylococcus aureus infection, unspecified site: Secondary | ICD-10-CM | POA: Diagnosis not present

## 2021-06-10 DIAGNOSIS — N2581 Secondary hyperparathyroidism of renal origin: Secondary | ICD-10-CM | POA: Diagnosis not present

## 2021-06-10 DIAGNOSIS — N186 End stage renal disease: Secondary | ICD-10-CM | POA: Diagnosis not present

## 2021-06-12 DIAGNOSIS — Z992 Dependence on renal dialysis: Secondary | ICD-10-CM | POA: Diagnosis not present

## 2021-06-12 DIAGNOSIS — Z23 Encounter for immunization: Secondary | ICD-10-CM | POA: Diagnosis not present

## 2021-06-12 DIAGNOSIS — N186 End stage renal disease: Secondary | ICD-10-CM | POA: Diagnosis not present

## 2021-06-12 DIAGNOSIS — T8249XD Other complication of vascular dialysis catheter, subsequent encounter: Secondary | ICD-10-CM | POA: Diagnosis not present

## 2021-06-12 DIAGNOSIS — A4901 Methicillin susceptible Staphylococcus aureus infection, unspecified site: Secondary | ICD-10-CM | POA: Diagnosis not present

## 2021-06-12 DIAGNOSIS — N2581 Secondary hyperparathyroidism of renal origin: Secondary | ICD-10-CM | POA: Diagnosis not present

## 2021-06-15 DIAGNOSIS — J9610 Chronic respiratory failure, unspecified whether with hypoxia or hypercapnia: Secondary | ICD-10-CM | POA: Diagnosis not present

## 2021-06-15 DIAGNOSIS — N2581 Secondary hyperparathyroidism of renal origin: Secondary | ICD-10-CM | POA: Diagnosis not present

## 2021-06-15 DIAGNOSIS — N186 End stage renal disease: Secondary | ICD-10-CM | POA: Diagnosis not present

## 2021-06-15 DIAGNOSIS — I5023 Acute on chronic systolic (congestive) heart failure: Secondary | ICD-10-CM | POA: Diagnosis not present

## 2021-06-15 DIAGNOSIS — I509 Heart failure, unspecified: Secondary | ICD-10-CM | POA: Diagnosis not present

## 2021-06-15 DIAGNOSIS — Z992 Dependence on renal dialysis: Secondary | ICD-10-CM | POA: Diagnosis not present

## 2021-06-15 DIAGNOSIS — A4901 Methicillin susceptible Staphylococcus aureus infection, unspecified site: Secondary | ICD-10-CM | POA: Diagnosis not present

## 2021-06-15 DIAGNOSIS — T8249XD Other complication of vascular dialysis catheter, subsequent encounter: Secondary | ICD-10-CM | POA: Diagnosis not present

## 2021-06-17 DIAGNOSIS — Z992 Dependence on renal dialysis: Secondary | ICD-10-CM | POA: Diagnosis not present

## 2021-06-17 DIAGNOSIS — N2581 Secondary hyperparathyroidism of renal origin: Secondary | ICD-10-CM | POA: Diagnosis not present

## 2021-06-17 DIAGNOSIS — A4901 Methicillin susceptible Staphylococcus aureus infection, unspecified site: Secondary | ICD-10-CM | POA: Diagnosis not present

## 2021-06-17 DIAGNOSIS — T8249XD Other complication of vascular dialysis catheter, subsequent encounter: Secondary | ICD-10-CM | POA: Diagnosis not present

## 2021-06-17 DIAGNOSIS — N186 End stage renal disease: Secondary | ICD-10-CM | POA: Diagnosis not present

## 2021-06-18 DIAGNOSIS — I5082 Biventricular heart failure: Secondary | ICD-10-CM | POA: Diagnosis not present

## 2021-06-18 DIAGNOSIS — I5023 Acute on chronic systolic (congestive) heart failure: Secondary | ICD-10-CM | POA: Diagnosis not present

## 2021-06-20 DIAGNOSIS — A4901 Methicillin susceptible Staphylococcus aureus infection, unspecified site: Secondary | ICD-10-CM | POA: Diagnosis not present

## 2021-06-20 DIAGNOSIS — Z992 Dependence on renal dialysis: Secondary | ICD-10-CM | POA: Diagnosis not present

## 2021-06-20 DIAGNOSIS — N186 End stage renal disease: Secondary | ICD-10-CM | POA: Diagnosis not present

## 2021-06-20 DIAGNOSIS — N2581 Secondary hyperparathyroidism of renal origin: Secondary | ICD-10-CM | POA: Diagnosis not present

## 2021-06-20 DIAGNOSIS — T8249XD Other complication of vascular dialysis catheter, subsequent encounter: Secondary | ICD-10-CM | POA: Diagnosis not present

## 2021-06-23 ENCOUNTER — Other Ambulatory Visit: Payer: Self-pay

## 2021-06-23 ENCOUNTER — Telehealth: Payer: Self-pay

## 2021-06-23 ENCOUNTER — Encounter: Payer: Self-pay | Admitting: Internal Medicine

## 2021-06-23 ENCOUNTER — Ambulatory Visit (INDEPENDENT_AMBULATORY_CARE_PROVIDER_SITE_OTHER): Payer: BC Managed Care – PPO | Admitting: Internal Medicine

## 2021-06-23 VITALS — BP 131/85 | HR 99 | Temp 98.2°F | Resp 16 | Ht 72.0 in | Wt >= 6400 oz

## 2021-06-23 DIAGNOSIS — M60009 Infective myositis, unspecified site: Secondary | ICD-10-CM

## 2021-06-23 DIAGNOSIS — K3533 Acute appendicitis with perforation and localized peritonitis, with abscess: Secondary | ICD-10-CM

## 2021-06-23 DIAGNOSIS — M0000 Staphylococcal arthritis, unspecified joint: Secondary | ICD-10-CM

## 2021-06-23 DIAGNOSIS — M8600 Acute hematogenous osteomyelitis, unspecified site: Secondary | ICD-10-CM

## 2021-06-23 DIAGNOSIS — M4628 Osteomyelitis of vertebra, sacral and sacrococcygeal region: Secondary | ICD-10-CM | POA: Diagnosis not present

## 2021-06-23 MED ORDER — CEPHALEXIN 500 MG PO CAPS
500.0000 mg | ORAL_CAPSULE | Freq: Every evening | ORAL | 1 refills | Status: DC
Start: 2021-06-23 — End: 2021-08-04

## 2021-06-23 NOTE — Progress Notes (Addendum)
[facility]  Patient Active Problem List   Diagnosis Date Noted   ESRD on dialysis [facility])    Palliative care by specialist    Full code status    Concern about end of life    Acute on chronic respiratory failure with hypoxia ([place]) 04/21/2021   Medical non-compliance 04/21/2021   Hypotension 04/21/2021   MSSA bacteremia    ESRD (end stage renal disease) on dialysis ([place]) 04/16/2021   ESRD (end stage renal disease) ([place]) 04/12/2021   Paroxysmal atrial flutter ([place]) 04/12/2021   Acute respiratory failure with hypoxia ([name]) 04/12/2021   Hyponatremia 04/12/2021   Dyspnea    Cardiogenic shock ([name])    Atrial flutter with rapid ventricular response ([place]) 02/11/2021   Elevated troponin 02/11/2021   Chronic respiratory failure with hypoxia ([place]) 02/11/2021   CAP (community acquired pneumonia) 08/03/3233   Chronic systolic CHF (congestive heart failure) ([name])    Hypokalemia    CHF (congestive heart failure) ([name])    Chronic cough    Acute on chronic systolic (congestive) heart failure ([place]) 02/26/2020   Morbid obesity ([place]) 02/26/2020   GERD without esophagitis 02/26/2020   Diuretic-induced hypokalemia 02/26/2020   OSA (obstructive sleep apnea) 02/26/2020   Hidradenitis suppurativa 02/26/2020   Prediabetes 02/26/2020      Subjective:    Patient ID: Phillip Young, male    DOB: 30-Nov-1985, 36 y.o.   MRN: [id number]  Chief Complaint  Patient presents with   Follow-up    Pt reports he has not had MRI done on hip yet. Still having tolerable pain. Pain level 3/10, pt would like to discuss switching to oral abx.     HPI:  Phillip Young is a 35 y.o. male morbidly obese, aflutter, lv dysfunction, esrd here for follow up mssa septicemia admission that was complicated by paraspinal abscess/left sacral om/left si joint septic arthritis  Patient had old lines removed 9/24 and new line placed 9/28. He was discharged on planned 6 weeks iv abx cefazolin  with dialysis  Mri left hip showed an elongated abscess on his left iliacus muscle as well; no size given.  ------------- 11/01 id f/u Doing much better. Immobilizing pain on admission now able to walk without cane/fww but not baseline and pain left hip worse with prolonged standing/walking/sitting No f/c/n/v/rash Does have watery diarrhea worse with eating.  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 30 tablet 0   amiodarone (PACERONE) 200 MG tablet Take 1 tablet (200 mg total) by  mouth daily. 30 tablet 0   amitriptyline (ELAVIL) 10 MG tablet Take 1 tablet (10 mg total) by mouth at bedtime. 30 tablet 0   apixaban (ELIQUIS) 5 MG TABS tablet Take 1 tablet (5 mg total) by mouth 2 (two) times daily. 60 tablet 11   ceFAZolin (ANCEF) 2-4 GM/100ML-% IVPB Inject 100 mLs (2 g total) into the vein every Tuesday, Thursday, and Saturday at 6 PM. 1 each    cyclobenzaprine (FLEXERIL) 5 MG tablet Take 1 tablet (5 mg total) by mouth 3 (three) times daily as needed for muscle spasms. 30 tablet 0   docusate sodium  (COLACE) 100 MG capsule Take 1 capsule (100 mg total) by mouth 2 (two) times daily. 10 capsule 0   famotidine (PEPCID) 20 MG tablet Take 1 tablet (20 mg total) by mouth 2 (two) times daily. 60 tablet 0   lactulose (CHRONULAC) 10 GM/15ML solution Take 15 mLs (10 g total) by mouth at bedtime. 236 mL 0   lanthanum (FOSRENOL) 1000 MG chewable tablet Chew 1 tablet (1,000 mg total) by mouth 3 (three) times daily with meals. 90 tablet 0   linaclotide (LINZESS) 145 MCG CAPS capsule Take 1 capsule (145 mcg total) by mouth daily before breakfast. 30 capsule 0   midodrine (PROAMATINE) 10 MG tablet Take 2 tablets (20 mg total) by mouth 3 (three) times daily with meals. 90 tablet 0   morphine (MS CONTIN) 15 MG 12 hr tablet Take 1 tablet (15 mg total) by mouth every 12 (twelve) hours. 60 tablet 0   multivitamin (RENA-VIT) TABS tablet Take 1 tablet by mouth at bedtime. 30 tablet 0   ondansetron (ZOFRAN) 4 MG/2ML SOLN injection Inject 2 mLs (4 mg total) into the vein every 6 (six) hours as needed for nausea or vomiting. 2 mL 0   Oxycodone HCl 10 MG TABS Take 1 tablet (10 mg total) by mouth every 6 (six) hours as needed. 30 tablet 0   polyethylene glycol (MIRALAX / GLYCOLAX) 17 g packet Take 17 g by mouth 2 (two) times daily. 14 each 0   sodium chloride (OCEAN) 0.65 % SOLN nasal spray Place 1 spray into both nostrils as needed for congestion.  0   acetaminophen (TYLENOL) 325 MG tablet Take 2 tablets (650 mg total) by mouth every 6 (six) hours as needed for mild pain (or Fever >/= 101). (Patient not taking: Reported on 05/26/2021)     Darbepoetin Alfa (ARANESP) 100 MCG/0.5ML SOSY injection Inject 0.5 mLs (100 mcg total) into the vein every Thursday with hemodialysis. (Patient not taking: Reported on 05/26/2021) 4.2 mL    No facility-administered medications prior to visit.     Social History   Socioeconomic History   Marital status: Single    Spouse name: Not on file   Number of children: Not on file   Years of  education: Not on file   Highest education level: Not on file  Occupational History   Not on file  Tobacco Use   Smoking status: Former    Packs/day: 1.00    Types: Cigarettes    Quit date: 2019    Years since quitting: 3.9   Smokeless tobacco: Former   Tobacco comments:    quit in 2019  Substance and Sexual Activity   Alcohol use: Never   Drug use: Never   Sexual activity: Not on file  Other Topics Concern   Not on file  Social History Narrative   Not on file   Social Determinants of  Health   Financial Resource Strain: Low Risk    Difficulty of Paying Living Expenses: Not very hard  Food Insecurity: Food Insecurity Present   Worried About [place] in the Last Year: Never true   Ran Out of Food in the Last Year: Sometimes true  Transportation Needs: No Transportation Needs   Lack of Transportation (Medical): No   Lack of Transportation (Non-Medical): No  Physical Activity: Not on file  Stress: Not on file  Social Connections: Not on file  Intimate Partner Violence: Not on file      Review of Systems    All other ros negative  Objective:    BP 131/85   Pulse 99   Temp 98.2 F (36.8 C) (Temporal)   Resp 16   Ht 6' (1.829 m)   Wt (!) 409 lb (185.5 kg)   SpO2 98%   BMI 55.47 kg/m  Nursing note and vital signs reviewed.  Physical Exam     General/constitutional: no distress, pleasant HEENT: Normocephalic, PER, Conj Clear, EOMI, Oropharynx clear Neck supple CV: rrr no mrg Lungs: clear to auscultation, normal respiratory effort Abd: Soft, Nontender Ext: no edema Skin: No Rash Neuro: nonfocal MSK: no peripheral joint swelling/tenderness/warmth; back spines nontender    Central line presence: right chest hd line tape clean and intact; no purulence/tenderness      Labs:  Micro:  Serology:  Imaging: 10/05 mri left hip 1. MR findings consistent with septic arthritis involving the left SI joint. Associated osteomyelitis involving  the sacrum and iliac bone. 2. Elongated abscess involving the left iliacus muscle. 3. Small abscess involving the left piriformis muscle and surrounding myofasciitis. 4. No findings for septic arthritis or osteomyelitis involving the hips.   Assessment & Plan:   Problem List Items Addressed This Visit   None Visit Diagnoses     Staphylococcal arthritis, septic arthritis of unspecified location [facility])    -  Primary   Sacral osteomyelitis ([name])       Abscess of iliac fossa       Infective myositis, unspecified site            No orders of the defined types were placed in this encounter.  Lines: 9/28 new dialysis line placed right tunneled ij; old line removed 9/24  Abx: 9/23-c cefazolin   ASSESSMENT: Mssa bacteremia Left si joint/sacral osteomyelitis Iliacus muscle abscess Esrd on iHD via previously right chest hd catheter LV dysfunction  35 yo male esrd admitted 9/23 with sepsis/mssa bacteremia due to clabsi. He has multiple complications as above  6/27 bcx cleared; 9/22 bcx positive  Left wrist/right knee don't appear to have septic joint picture on exam and resolved during admission; no mri imaging done  Left hip pain, ultimately had mri left hip that showed left si joint septic arthritis and sacral OM along with left iliacus muscle abscess  10/11 tee no evidence endocarditis; ef 20%. Cardiology awared -- at this time due to obesity not candidate for lvad or transplant   He has planned 6 weeks iv abx until 11/06. However given muscle abscess, will continue for another 4 weeks cefazolin with dialysis and reassess  ------------ 11/01 id assesssment Diarrhea abx asociated; clinically doesn't appear to be cdiff Concern for central line hygiene reviewed -- no clinical sign of insertion site infection at this time Clinically improving but extending tx due to abscess present and need for repeat mri   11/29 id assessment Discussed with patient need for repeat mri to  guide abx therapy I think given duration (9-10 weeks with iv abx) reasonable to try PO Mri will be reordered Will see him in follow closely  -start cephalexin 500 mg once a day after dialysis on dialysis days -mri left hip and pelvis noncontrast ordered -f/u in 3 weeks      Follow-up: Return in about 3 weeks (around 07/14/2021).   Addendum Post visit labs received from fresenous dialysis ... no crp 11/17 wbc 4; hb 11   Jabier Mutton, [place] for [place] [phone]  pager   [phone] cell 06/23/2021, 11:03 AM

## 2021-06-23 NOTE — Telephone Encounter (Signed)
Verbal orders given to Legrand Como, RN at [facility] patient is D/C IV cefazolin and patient is going to start Cephalexin 500 mg once QD PO - after dialysis on dialysis days. Michael repeated verbal orders back and voiced his understanding.    [place], CMA

## 2021-06-23 NOTE — Patient Instructions (Addendum)
Please do mri of your hip/pelvis  Without the mri I do not have a clear guidance of what to do with your antibiotics  I have ordered mri of the left hip and the entire pelvis; please have them scheduled and done  Start cephalexin 500 mg pill once a day before bedtime (on dialysis days make sure this is after dialysis)   See me in 3 weeks

## 2021-06-23 NOTE — Telephone Encounter (Signed)
Thanks Tiffany!

## 2021-06-24 DIAGNOSIS — N2581 Secondary hyperparathyroidism of renal origin: Secondary | ICD-10-CM | POA: Diagnosis not present

## 2021-06-24 DIAGNOSIS — T8249XD Other complication of vascular dialysis catheter, subsequent encounter: Secondary | ICD-10-CM | POA: Diagnosis not present

## 2021-06-24 DIAGNOSIS — N186 End stage renal disease: Secondary | ICD-10-CM | POA: Diagnosis not present

## 2021-06-24 DIAGNOSIS — Z992 Dependence on renal dialysis: Secondary | ICD-10-CM | POA: Diagnosis not present

## 2021-06-24 DIAGNOSIS — E1122 Type 2 diabetes mellitus with diabetic chronic kidney disease: Secondary | ICD-10-CM | POA: Diagnosis not present

## 2021-06-25 DIAGNOSIS — I5023 Acute on chronic systolic (congestive) heart failure: Secondary | ICD-10-CM | POA: Diagnosis not present

## 2021-06-25 DIAGNOSIS — I5082 Biventricular heart failure: Secondary | ICD-10-CM | POA: Diagnosis not present

## 2021-06-26 DIAGNOSIS — N186 End stage renal disease: Secondary | ICD-10-CM | POA: Diagnosis not present

## 2021-06-26 DIAGNOSIS — E1122 Type 2 diabetes mellitus with diabetic chronic kidney disease: Secondary | ICD-10-CM | POA: Insufficient documentation

## 2021-06-26 DIAGNOSIS — N2581 Secondary hyperparathyroidism of renal origin: Secondary | ICD-10-CM | POA: Diagnosis not present

## 2021-06-26 DIAGNOSIS — Z992 Dependence on renal dialysis: Secondary | ICD-10-CM | POA: Diagnosis not present

## 2021-06-26 DIAGNOSIS — T8249XD Other complication of vascular dialysis catheter, subsequent encounter: Secondary | ICD-10-CM | POA: Diagnosis not present

## 2021-06-29 DIAGNOSIS — N186 End stage renal disease: Secondary | ICD-10-CM | POA: Diagnosis not present

## 2021-06-29 DIAGNOSIS — N2581 Secondary hyperparathyroidism of renal origin: Secondary | ICD-10-CM | POA: Diagnosis not present

## 2021-06-29 DIAGNOSIS — T8249XD Other complication of vascular dialysis catheter, subsequent encounter: Secondary | ICD-10-CM | POA: Diagnosis not present

## 2021-06-29 DIAGNOSIS — Z992 Dependence on renal dialysis: Secondary | ICD-10-CM | POA: Diagnosis not present

## 2021-06-30 ENCOUNTER — Encounter: Payer: BC Managed Care – PPO | Admitting: Vascular Surgery

## 2021-06-30 ENCOUNTER — Encounter (HOSPITAL_COMMUNITY): Payer: BC Managed Care – PPO

## 2021-06-30 ENCOUNTER — Other Ambulatory Visit (HOSPITAL_COMMUNITY): Payer: BC Managed Care – PPO

## 2021-07-01 DIAGNOSIS — N186 End stage renal disease: Secondary | ICD-10-CM | POA: Diagnosis not present

## 2021-07-01 DIAGNOSIS — N2581 Secondary hyperparathyroidism of renal origin: Secondary | ICD-10-CM | POA: Diagnosis not present

## 2021-07-01 DIAGNOSIS — Z992 Dependence on renal dialysis: Secondary | ICD-10-CM | POA: Diagnosis not present

## 2021-07-01 DIAGNOSIS — T8249XD Other complication of vascular dialysis catheter, subsequent encounter: Secondary | ICD-10-CM | POA: Diagnosis not present

## 2021-07-02 ENCOUNTER — Ambulatory Visit (HOSPITAL_COMMUNITY)
Admission: RE | Admit: 2021-07-02 | Discharge: 2021-07-02 | Disposition: A | Discharge status: Home / Self Care | Payer: BC Managed Care – PPO | Source: Ambulatory Visit | Attending: Internal Medicine | Admitting: Internal Medicine

## 2021-07-02 ENCOUNTER — Other Ambulatory Visit: Payer: Self-pay

## 2021-07-02 DIAGNOSIS — M60009 Infective myositis, unspecified site: Secondary | ICD-10-CM | POA: Diagnosis not present

## 2021-07-02 DIAGNOSIS — M0000 Staphylococcal arthritis, unspecified joint: Secondary | ICD-10-CM | POA: Diagnosis not present

## 2021-07-02 DIAGNOSIS — R600 Localized edema: Secondary | ICD-10-CM | POA: Diagnosis not present

## 2021-07-02 DIAGNOSIS — M4628 Osteomyelitis of vertebra, sacral and sacrococcygeal region: Secondary | ICD-10-CM

## 2021-07-02 DIAGNOSIS — M8600 Acute hematogenous osteomyelitis, unspecified site: Secondary | ICD-10-CM | POA: Diagnosis not present

## 2021-07-02 DIAGNOSIS — K3533 Acute appendicitis with perforation and localized peritonitis, with abscess: Secondary | ICD-10-CM | POA: Diagnosis not present

## 2021-07-02 DIAGNOSIS — L0291 Cutaneous abscess, unspecified: Secondary | ICD-10-CM | POA: Diagnosis not present

## 2021-07-02 IMAGING — MR MR HIP*L* W/O CM
4 of 7 series · 19 of 40 positions shown · non-contrast
Comparison: MRI left hip [DATE].

CLINICAL DATA: History of septic arthritis of the left SI joint and
infective myositis.

EXAM:
MRI OF THE LEFT HIP WITHOUT CONTRAST
TECHNIQUE: Multiplanar, multisequence MR imaging of the left hip was performed.
No intravenous contrast was administered.

[Series 2: T2 fat-sat · coronal · 4.0mm · 0.78mm/px · 3 of 30 slices shown]
[im 1/30]
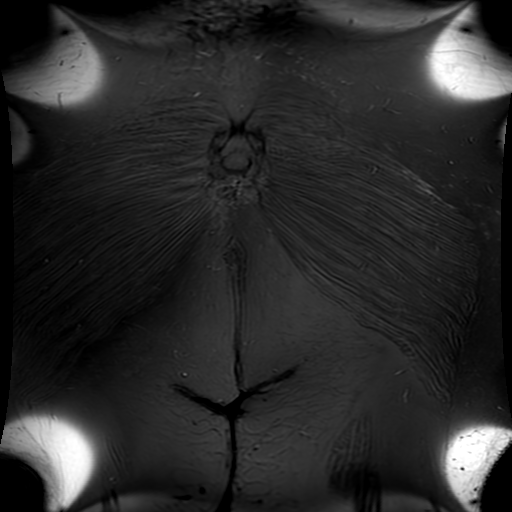
[im 15/30]
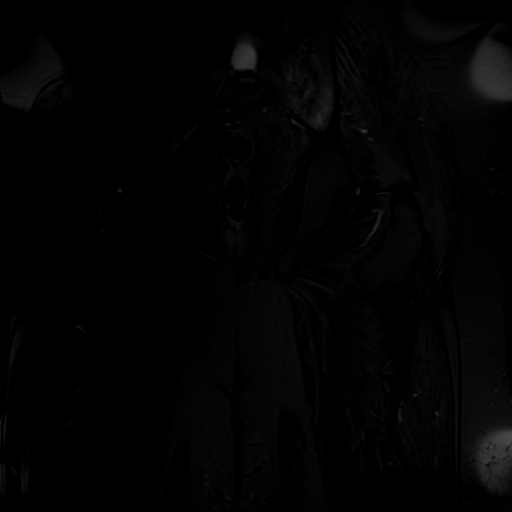
[im 30/30]
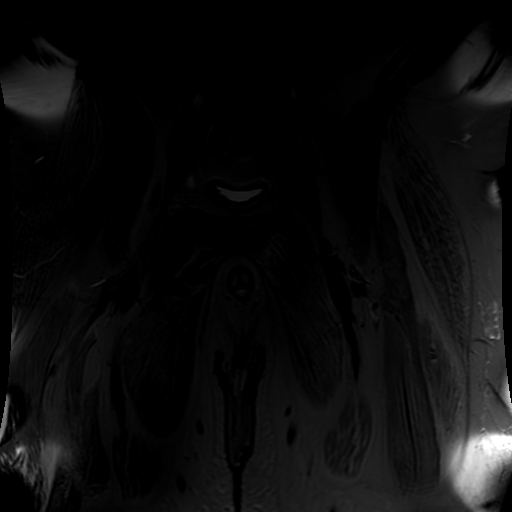

[Series 6: PD fat-sat · sagittal · 4.0mm · 0.35mm/px · 7 of 50 slices shown (1 of 3)]
[im 1/50]
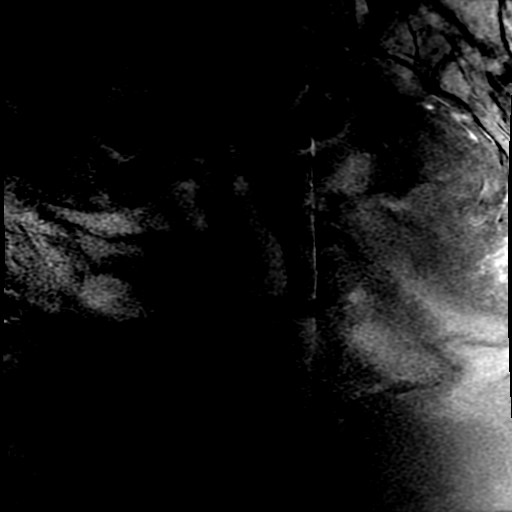
[im 9/50]
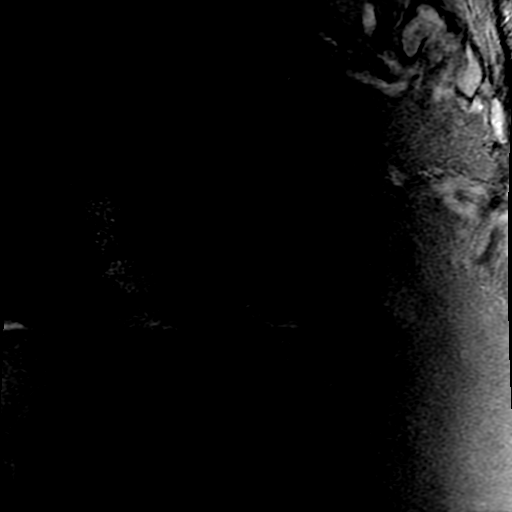
[im 17/50]
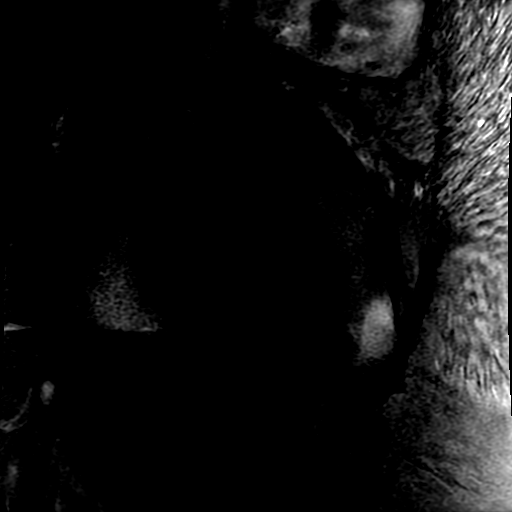
[im 25/50]
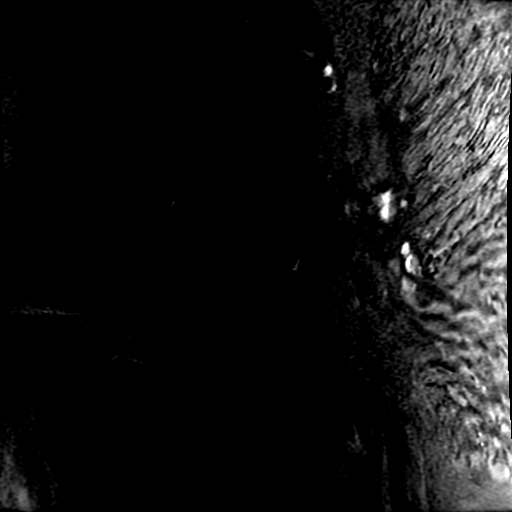
[im 33/50]
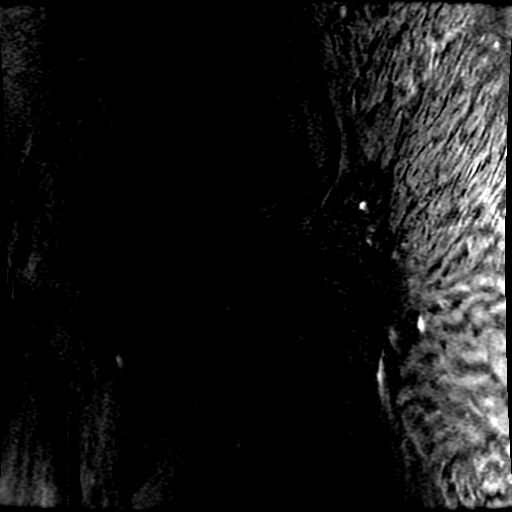
[im 41/50]
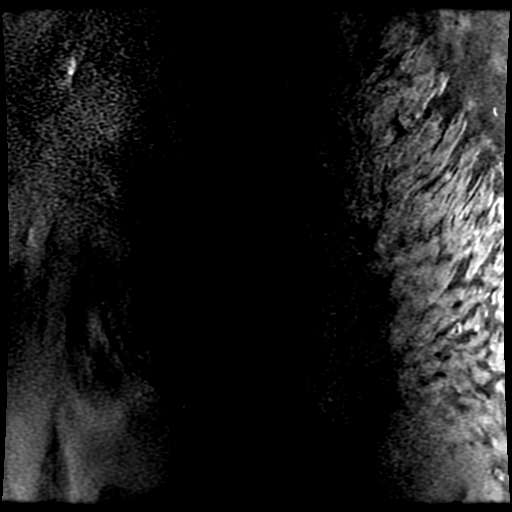
[im 50/50]
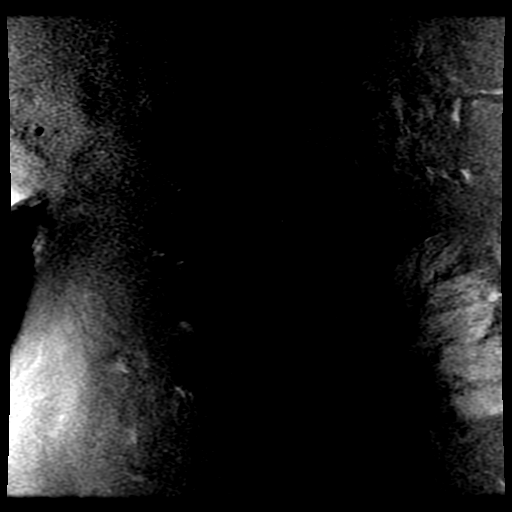

[Series 7: PD fat-sat · coronal · 4.0mm · 0.35mm/px · 5 of 33 slices shown (2 of 3)]
[im 1/33]
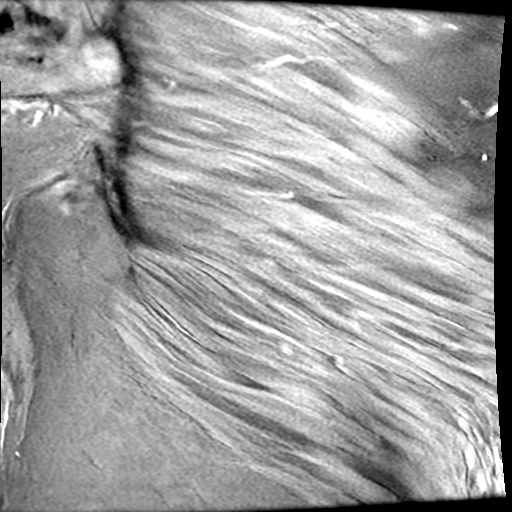
[im 9/33]
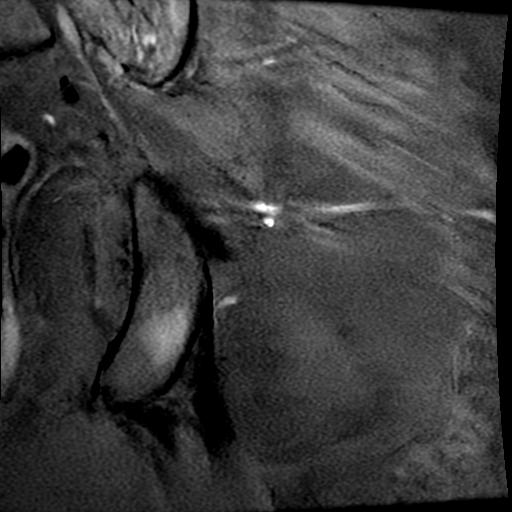
[im 17/33]
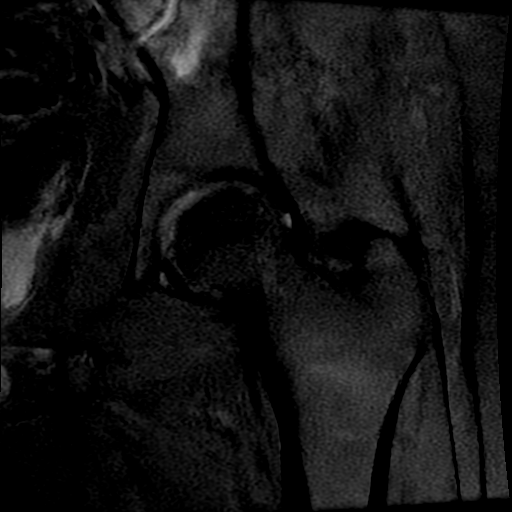
[im 25/33]
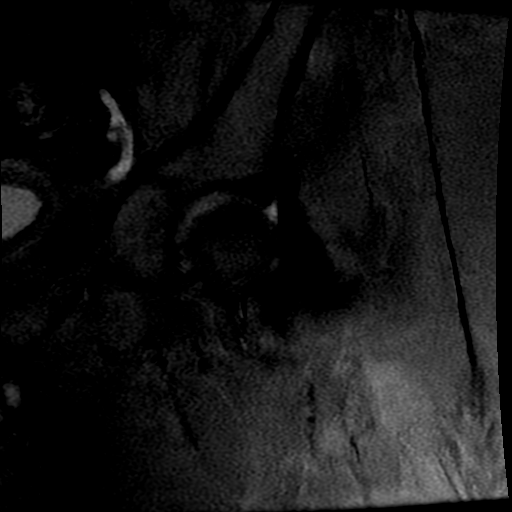
[im 33/33]
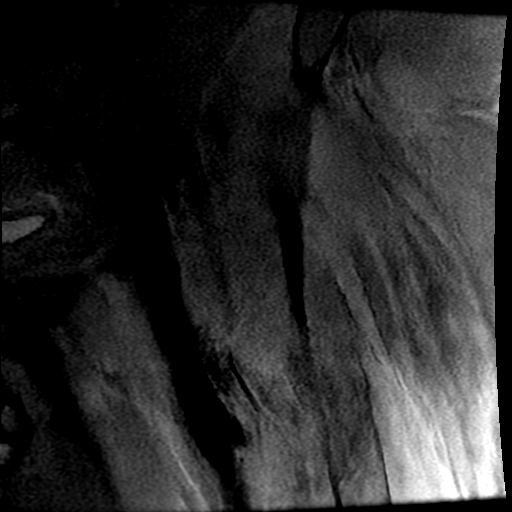

[Series 8: PD fat-sat · sagittal · 4.0mm · 0.35mm/px · 4 of 50 slices shown (3 of 3)]
[im 1/50]
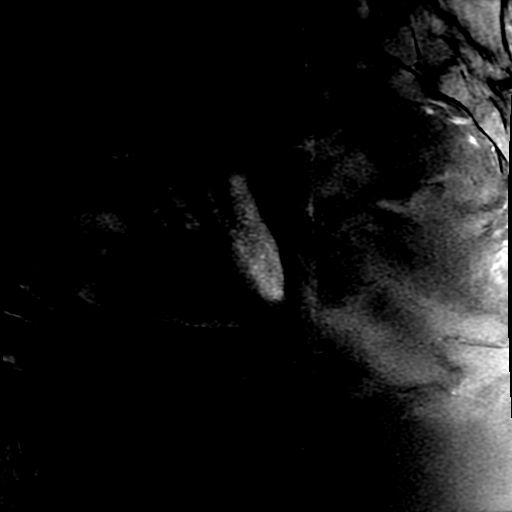
[im 9/50]
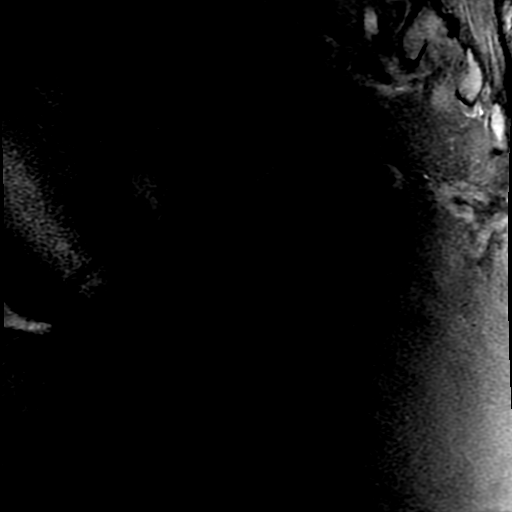
[im 25/50]
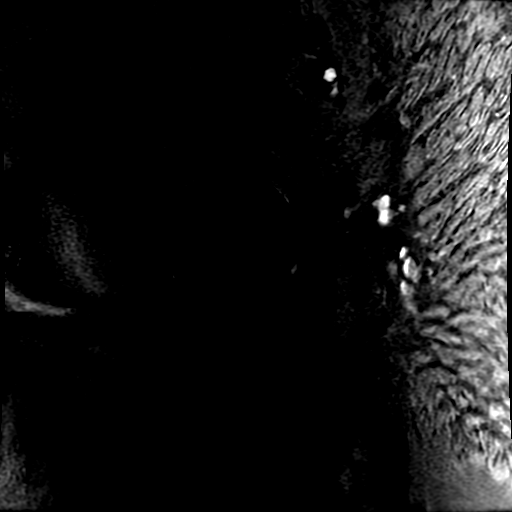
[im 41/50]
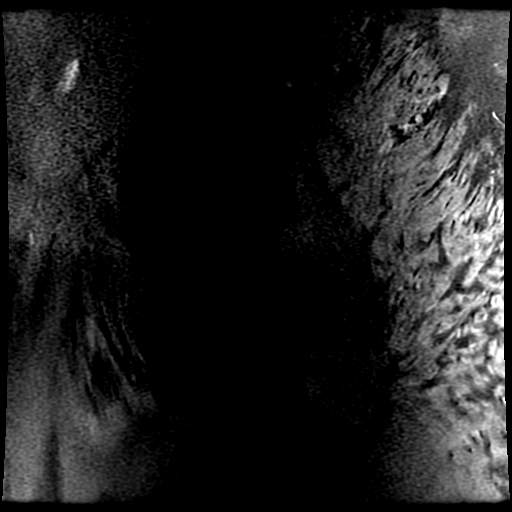

[19 of 40 positions shown; findings below may reference images not displayed]

FINDINGS: Bones/Joint/Cartilage

Marrow edema about the left SI joint persists and there is some
erosive change at the joint. The extent of edema is somewhat greater
than on the prior examination but it demonstrates new patchiness
with normal fat signal present which are new since the prior exam
and consistent with improved infection. Abscesses extending out of
the joint into the left iliacus and piriformis muscles have
resolved. No new focus of marrow signal abnormality is identified.
There is no right SI joint effusion or hip effusion.

Ligaments

Intact.

Muscles and Tendons

Intact.  No abscess or other new abnormality.

Soft tissues

Negative.
IMPRESSION: Improved appearance of left SI joint septic arthritis. Marrow edema
and some erosive change are seen about the joint. Abscesses
extending out of the joint have resolved

## 2021-07-06 DIAGNOSIS — N2581 Secondary hyperparathyroidism of renal origin: Secondary | ICD-10-CM | POA: Diagnosis not present

## 2021-07-06 DIAGNOSIS — T8249XD Other complication of vascular dialysis catheter, subsequent encounter: Secondary | ICD-10-CM | POA: Diagnosis not present

## 2021-07-06 DIAGNOSIS — N186 End stage renal disease: Secondary | ICD-10-CM | POA: Diagnosis not present

## 2021-07-06 DIAGNOSIS — Z992 Dependence on renal dialysis: Secondary | ICD-10-CM | POA: Diagnosis not present

## 2021-07-08 DIAGNOSIS — N186 End stage renal disease: Secondary | ICD-10-CM | POA: Diagnosis not present

## 2021-07-08 DIAGNOSIS — N2581 Secondary hyperparathyroidism of renal origin: Secondary | ICD-10-CM | POA: Diagnosis not present

## 2021-07-08 DIAGNOSIS — Z992 Dependence on renal dialysis: Secondary | ICD-10-CM | POA: Diagnosis not present

## 2021-07-08 DIAGNOSIS — T8249XD Other complication of vascular dialysis catheter, subsequent encounter: Secondary | ICD-10-CM | POA: Diagnosis not present

## 2021-07-10 DIAGNOSIS — Z992 Dependence on renal dialysis: Secondary | ICD-10-CM | POA: Diagnosis not present

## 2021-07-10 DIAGNOSIS — N2581 Secondary hyperparathyroidism of renal origin: Secondary | ICD-10-CM | POA: Diagnosis not present

## 2021-07-10 DIAGNOSIS — N186 End stage renal disease: Secondary | ICD-10-CM | POA: Diagnosis not present

## 2021-07-10 DIAGNOSIS — T8249XD Other complication of vascular dialysis catheter, subsequent encounter: Secondary | ICD-10-CM | POA: Diagnosis not present

## 2021-07-13 DIAGNOSIS — N2581 Secondary hyperparathyroidism of renal origin: Secondary | ICD-10-CM | POA: Diagnosis not present

## 2021-07-13 DIAGNOSIS — T8249XD Other complication of vascular dialysis catheter, subsequent encounter: Secondary | ICD-10-CM | POA: Diagnosis not present

## 2021-07-13 DIAGNOSIS — N186 End stage renal disease: Secondary | ICD-10-CM | POA: Diagnosis not present

## 2021-07-13 DIAGNOSIS — Z992 Dependence on renal dialysis: Secondary | ICD-10-CM | POA: Diagnosis not present

## 2021-07-15 DIAGNOSIS — N186 End stage renal disease: Secondary | ICD-10-CM | POA: Diagnosis not present

## 2021-07-15 DIAGNOSIS — Z992 Dependence on renal dialysis: Secondary | ICD-10-CM | POA: Diagnosis not present

## 2021-07-15 DIAGNOSIS — T8249XD Other complication of vascular dialysis catheter, subsequent encounter: Secondary | ICD-10-CM | POA: Diagnosis not present

## 2021-07-15 DIAGNOSIS — N2581 Secondary hyperparathyroidism of renal origin: Secondary | ICD-10-CM | POA: Diagnosis not present

## 2021-07-17 DIAGNOSIS — Z992 Dependence on renal dialysis: Secondary | ICD-10-CM | POA: Diagnosis not present

## 2021-07-17 DIAGNOSIS — T8249XD Other complication of vascular dialysis catheter, subsequent encounter: Secondary | ICD-10-CM | POA: Diagnosis not present

## 2021-07-17 DIAGNOSIS — N186 End stage renal disease: Secondary | ICD-10-CM | POA: Diagnosis not present

## 2021-07-17 DIAGNOSIS — N2581 Secondary hyperparathyroidism of renal origin: Secondary | ICD-10-CM | POA: Diagnosis not present

## 2021-07-18 DIAGNOSIS — I5082 Biventricular heart failure: Secondary | ICD-10-CM | POA: Diagnosis not present

## 2021-07-18 DIAGNOSIS — I5023 Acute on chronic systolic (congestive) heart failure: Secondary | ICD-10-CM | POA: Diagnosis not present

## 2021-07-21 DIAGNOSIS — T8249XA Other complication of vascular dialysis catheter, initial encounter: Secondary | ICD-10-CM | POA: Diagnosis not present

## 2021-07-21 DIAGNOSIS — N2581 Secondary hyperparathyroidism of renal origin: Secondary | ICD-10-CM | POA: Diagnosis not present

## 2021-07-21 DIAGNOSIS — Z992 Dependence on renal dialysis: Secondary | ICD-10-CM | POA: Diagnosis not present

## 2021-07-21 DIAGNOSIS — N186 End stage renal disease: Secondary | ICD-10-CM | POA: Diagnosis not present

## 2021-07-21 DIAGNOSIS — T8249XD Other complication of vascular dialysis catheter, subsequent encounter: Secondary | ICD-10-CM | POA: Diagnosis not present

## 2021-07-25 DIAGNOSIS — N186 End stage renal disease: Secondary | ICD-10-CM | POA: Diagnosis not present

## 2021-07-25 DIAGNOSIS — N2581 Secondary hyperparathyroidism of renal origin: Secondary | ICD-10-CM | POA: Diagnosis not present

## 2021-07-25 DIAGNOSIS — T8249XA Other complication of vascular dialysis catheter, initial encounter: Secondary | ICD-10-CM | POA: Diagnosis not present

## 2021-07-25 DIAGNOSIS — N179 Acute kidney failure, unspecified: Secondary | ICD-10-CM | POA: Diagnosis not present

## 2021-07-25 DIAGNOSIS — Z992 Dependence on renal dialysis: Secondary | ICD-10-CM | POA: Diagnosis not present

## 2021-07-25 DIAGNOSIS — T8249XD Other complication of vascular dialysis catheter, subsequent encounter: Secondary | ICD-10-CM | POA: Diagnosis not present

## 2021-07-26 DIAGNOSIS — I5023 Acute on chronic systolic (congestive) heart failure: Secondary | ICD-10-CM | POA: Diagnosis not present

## 2021-07-26 DIAGNOSIS — I5082 Biventricular heart failure: Secondary | ICD-10-CM | POA: Diagnosis not present

## 2021-07-28 ENCOUNTER — Encounter: Payer: BC Managed Care – PPO | Admitting: Vascular Surgery

## 2021-07-28 ENCOUNTER — Ambulatory Visit (HOSPITAL_COMMUNITY): Payer: BC Managed Care – PPO | Attending: Vascular Surgery

## 2021-07-28 ENCOUNTER — Ambulatory Visit (HOSPITAL_COMMUNITY): Payer: BC Managed Care – PPO

## 2021-07-28 DIAGNOSIS — T8249XA Other complication of vascular dialysis catheter, initial encounter: Secondary | ICD-10-CM | POA: Diagnosis not present

## 2021-07-28 DIAGNOSIS — Z992 Dependence on renal dialysis: Secondary | ICD-10-CM | POA: Diagnosis not present

## 2021-07-28 DIAGNOSIS — N186 End stage renal disease: Secondary | ICD-10-CM | POA: Diagnosis not present

## 2021-07-28 DIAGNOSIS — N2581 Secondary hyperparathyroidism of renal origin: Secondary | ICD-10-CM | POA: Diagnosis not present

## 2021-07-30 DIAGNOSIS — Z992 Dependence on renal dialysis: Secondary | ICD-10-CM | POA: Diagnosis not present

## 2021-07-30 DIAGNOSIS — N2581 Secondary hyperparathyroidism of renal origin: Secondary | ICD-10-CM | POA: Diagnosis not present

## 2021-07-30 DIAGNOSIS — T8249XA Other complication of vascular dialysis catheter, initial encounter: Secondary | ICD-10-CM | POA: Diagnosis not present

## 2021-07-30 DIAGNOSIS — N186 End stage renal disease: Secondary | ICD-10-CM | POA: Diagnosis not present

## 2021-08-03 ENCOUNTER — Ambulatory Visit: Payer: BC Managed Care – PPO | Admitting: Internal Medicine

## 2021-08-04 ENCOUNTER — Encounter: Payer: Self-pay | Admitting: Internal Medicine

## 2021-08-04 DIAGNOSIS — T8249XA Other complication of vascular dialysis catheter, initial encounter: Secondary | ICD-10-CM | POA: Diagnosis not present

## 2021-08-04 DIAGNOSIS — N2581 Secondary hyperparathyroidism of renal origin: Secondary | ICD-10-CM | POA: Diagnosis not present

## 2021-08-04 DIAGNOSIS — Z992 Dependence on renal dialysis: Secondary | ICD-10-CM | POA: Diagnosis not present

## 2021-08-04 DIAGNOSIS — N186 End stage renal disease: Secondary | ICD-10-CM | POA: Diagnosis not present

## 2021-08-04 MED ORDER — CEPHALEXIN 500 MG PO CAPS: 500.0000 mg | ORAL_CAPSULE | Freq: Every day | ORAL | 2 refills | Status: DC

## 2021-08-04 NOTE — Telephone Encounter (Signed)
Cephalexin rx placed 1 capsule of 500 mg at bedtime every day   30 day with 2 refills   thanks

## 2021-08-04 NOTE — Telephone Encounter (Signed)
Pt is being seen at [facility] on [place].  F: [phone] Confirmed with Rn that patient is being seen there. Will have provider sign orders tomorrow during clinic.  Leatrice Jewels, RMA

## 2021-08-05 NOTE — Telephone Encounter (Signed)
Order for CRP signed and faxed this morning. Fax confirmation received. Leatrice Jewels, RMA

## 2021-08-06 DIAGNOSIS — N2581 Secondary hyperparathyroidism of renal origin: Secondary | ICD-10-CM | POA: Diagnosis not present

## 2021-08-06 DIAGNOSIS — Z992 Dependence on renal dialysis: Secondary | ICD-10-CM | POA: Diagnosis not present

## 2021-08-06 DIAGNOSIS — T8249XA Other complication of vascular dialysis catheter, initial encounter: Secondary | ICD-10-CM | POA: Diagnosis not present

## 2021-08-06 DIAGNOSIS — N186 End stage renal disease: Secondary | ICD-10-CM | POA: Diagnosis not present

## 2021-08-07 DIAGNOSIS — J9610 Chronic respiratory failure, unspecified whether with hypoxia or hypercapnia: Secondary | ICD-10-CM | POA: Diagnosis not present

## 2021-08-07 DIAGNOSIS — E662 Morbid (severe) obesity with alveolar hypoventilation: Secondary | ICD-10-CM | POA: Diagnosis not present

## 2021-08-07 DIAGNOSIS — I5023 Acute on chronic systolic (congestive) heart failure: Secondary | ICD-10-CM | POA: Diagnosis not present

## 2021-08-07 DIAGNOSIS — I509 Heart failure, unspecified: Secondary | ICD-10-CM | POA: Diagnosis not present

## 2021-08-07 NOTE — Telephone Encounter (Addendum)
[facility] to confirm orders were received and CRP was drawn during dialysis. Spoke with [name], RN who states order was not received. Was able to take verbal order to have lab done during next appt.  Requested results be faxed to our office to review. Order was repeated and confirmed before ending call. P:[phone] Leatrice Jewels, RMA

## 2021-08-07 NOTE — Telephone Encounter (Signed)
Spoke with patient and relayed that he should be taking the cephalexin daily, but after dialysis on days that he has dialysis. He states he does not want to take the medication at bedtime. Relayed that he does not need to take it that late, just after he has his dialysis session. Notified him that MyChart message was sent with those instructions if he needs to refer to it.   Scheduled for follow up 2/1.   Beryle Flock, RN

## 2021-08-08 DIAGNOSIS — N2581 Secondary hyperparathyroidism of renal origin: Secondary | ICD-10-CM | POA: Diagnosis not present

## 2021-08-08 DIAGNOSIS — T8249XA Other complication of vascular dialysis catheter, initial encounter: Secondary | ICD-10-CM | POA: Diagnosis not present

## 2021-08-08 DIAGNOSIS — Z992 Dependence on renal dialysis: Secondary | ICD-10-CM | POA: Diagnosis not present

## 2021-08-08 DIAGNOSIS — N186 End stage renal disease: Secondary | ICD-10-CM | POA: Diagnosis not present

## 2021-08-11 DIAGNOSIS — L298 Other pruritus: Secondary | ICD-10-CM | POA: Diagnosis not present

## 2021-08-11 DIAGNOSIS — N186 End stage renal disease: Secondary | ICD-10-CM | POA: Diagnosis not present

## 2021-08-11 DIAGNOSIS — Z992 Dependence on renal dialysis: Secondary | ICD-10-CM | POA: Diagnosis not present

## 2021-08-11 DIAGNOSIS — T8249XA Other complication of vascular dialysis catheter, initial encounter: Secondary | ICD-10-CM | POA: Diagnosis not present

## 2021-08-11 DIAGNOSIS — N2581 Secondary hyperparathyroidism of renal origin: Secondary | ICD-10-CM | POA: Diagnosis not present

## 2021-08-13 DIAGNOSIS — Z9911 Dependence on respirator [ventilator] status: Secondary | ICD-10-CM | POA: Diagnosis not present

## 2021-08-13 DIAGNOSIS — I129 Hypertensive chronic kidney disease with stage 1 through stage 4 chronic kidney disease, or unspecified chronic kidney disease: Secondary | ICD-10-CM | POA: Diagnosis not present

## 2021-08-13 DIAGNOSIS — Z79899 Other long term (current) drug therapy: Secondary | ICD-10-CM | POA: Diagnosis not present

## 2021-08-13 DIAGNOSIS — B958 Unspecified staphylococcus as the cause of diseases classified elsewhere: Secondary | ICD-10-CM | POA: Diagnosis not present

## 2021-08-13 DIAGNOSIS — I509 Heart failure, unspecified: Secondary | ICD-10-CM | POA: Diagnosis not present

## 2021-08-13 DIAGNOSIS — I10 Essential (primary) hypertension: Secondary | ICD-10-CM | POA: Diagnosis not present

## 2021-08-13 DIAGNOSIS — N186 End stage renal disease: Secondary | ICD-10-CM | POA: Diagnosis not present

## 2021-08-13 DIAGNOSIS — Z7901 Long term (current) use of anticoagulants: Secondary | ICD-10-CM | POA: Diagnosis not present

## 2021-08-13 DIAGNOSIS — E1169 Type 2 diabetes mellitus with other specified complication: Secondary | ICD-10-CM | POA: Diagnosis not present

## 2021-08-13 DIAGNOSIS — M4628 Osteomyelitis of vertebra, sacral and sacrococcygeal region: Secondary | ICD-10-CM | POA: Diagnosis not present

## 2021-08-13 DIAGNOSIS — Z6841 Body Mass Index (BMI) 40.0 and over, adult: Secondary | ICD-10-CM | POA: Diagnosis not present

## 2021-08-13 DIAGNOSIS — E669 Obesity, unspecified: Secondary | ICD-10-CM | POA: Diagnosis not present

## 2021-08-13 DIAGNOSIS — D649 Anemia, unspecified: Secondary | ICD-10-CM | POA: Diagnosis not present

## 2021-08-13 DIAGNOSIS — M00052 Staphylococcal arthritis, left hip: Secondary | ICD-10-CM | POA: Diagnosis not present

## 2021-08-13 DIAGNOSIS — J9610 Chronic respiratory failure, unspecified whether with hypoxia or hypercapnia: Secondary | ICD-10-CM | POA: Diagnosis not present

## 2021-08-13 DIAGNOSIS — Z20822 Contact with and (suspected) exposure to covid-19: Secondary | ICD-10-CM | POA: Diagnosis not present

## 2021-08-13 DIAGNOSIS — R0602 Shortness of breath: Secondary | ICD-10-CM | POA: Diagnosis not present

## 2021-08-13 DIAGNOSIS — E8770 Fluid overload, unspecified: Secondary | ICD-10-CM | POA: Diagnosis not present

## 2021-08-13 DIAGNOSIS — I132 Hypertensive heart and chronic kidney disease with heart failure and with stage 5 chronic kidney disease, or end stage renal disease: Secondary | ICD-10-CM | POA: Diagnosis not present

## 2021-08-13 DIAGNOSIS — E1122 Type 2 diabetes mellitus with diabetic chronic kidney disease: Secondary | ICD-10-CM | POA: Diagnosis not present

## 2021-08-13 DIAGNOSIS — G4733 Obstructive sleep apnea (adult) (pediatric): Secondary | ICD-10-CM | POA: Diagnosis not present

## 2021-08-13 DIAGNOSIS — I4892 Unspecified atrial flutter: Secondary | ICD-10-CM | POA: Diagnosis not present

## 2021-08-13 DIAGNOSIS — N189 Chronic kidney disease, unspecified: Secondary | ICD-10-CM | POA: Diagnosis not present

## 2021-08-13 DIAGNOSIS — Z992 Dependence on renal dialysis: Secondary | ICD-10-CM | POA: Diagnosis not present

## 2021-08-13 DIAGNOSIS — I480 Paroxysmal atrial fibrillation: Secondary | ICD-10-CM | POA: Diagnosis not present

## 2021-08-13 DIAGNOSIS — I5023 Acute on chronic systolic (congestive) heart failure: Secondary | ICD-10-CM | POA: Diagnosis not present

## 2021-08-18 ENCOUNTER — Emergency Department (HOSPITAL_COMMUNITY): Payer: BC Managed Care – PPO

## 2021-08-18 ENCOUNTER — Inpatient Hospital Stay (HOSPITAL_COMMUNITY)
Admission: EM | Admit: 2021-08-18 | Discharge: 2021-08-22 | DRG: 673 | Disposition: A | Discharge status: Home / Self Care | Payer: BC Managed Care – PPO | Attending: Internal Medicine | Admitting: Internal Medicine

## 2021-08-18 ENCOUNTER — Other Ambulatory Visit: Payer: Self-pay

## 2021-08-18 ENCOUNTER — Encounter (HOSPITAL_COMMUNITY): Payer: Self-pay

## 2021-08-18 DIAGNOSIS — K219 Gastro-esophageal reflux disease without esophagitis: Secondary | ICD-10-CM | POA: Diagnosis present

## 2021-08-18 DIAGNOSIS — Z87891 Personal history of nicotine dependence: Secondary | ICD-10-CM

## 2021-08-18 DIAGNOSIS — I132 Hypertensive heart and chronic kidney disease with heart failure and with stage 5 chronic kidney disease, or end stage renal disease: Secondary | ICD-10-CM | POA: Diagnosis not present

## 2021-08-18 DIAGNOSIS — R7989 Other specified abnormal findings of blood chemistry: Secondary | ICD-10-CM | POA: Diagnosis not present

## 2021-08-18 DIAGNOSIS — E8779 Other fluid overload: Secondary | ICD-10-CM | POA: Diagnosis not present

## 2021-08-18 DIAGNOSIS — D509 Iron deficiency anemia, unspecified: Secondary | ICD-10-CM | POA: Diagnosis present

## 2021-08-18 DIAGNOSIS — T829XXA Unspecified complication of cardiac and vascular prosthetic device, implant and graft, initial encounter: Secondary | ICD-10-CM

## 2021-08-18 DIAGNOSIS — G4733 Obstructive sleep apnea (adult) (pediatric): Secondary | ICD-10-CM | POA: Diagnosis present

## 2021-08-18 DIAGNOSIS — Z992 Dependence on renal dialysis: Secondary | ICD-10-CM

## 2021-08-18 DIAGNOSIS — I4892 Unspecified atrial flutter: Secondary | ICD-10-CM | POA: Diagnosis not present

## 2021-08-18 DIAGNOSIS — E1122 Type 2 diabetes mellitus with diabetic chronic kidney disease: Secondary | ICD-10-CM | POA: Diagnosis not present

## 2021-08-18 DIAGNOSIS — Y712 Prosthetic and other implants, materials and accessory cardiovascular devices associated with adverse incidents: Secondary | ICD-10-CM | POA: Diagnosis present

## 2021-08-18 DIAGNOSIS — Z9115 Patient's noncompliance with renal dialysis: Secondary | ICD-10-CM

## 2021-08-18 DIAGNOSIS — I509 Heart failure, unspecified: Secondary | ICD-10-CM | POA: Diagnosis not present

## 2021-08-18 DIAGNOSIS — Z20822 Contact with and (suspected) exposure to covid-19: Secondary | ICD-10-CM | POA: Diagnosis not present

## 2021-08-18 DIAGNOSIS — R112 Nausea with vomiting, unspecified: Secondary | ICD-10-CM | POA: Diagnosis present

## 2021-08-18 DIAGNOSIS — I428 Other cardiomyopathies: Secondary | ICD-10-CM | POA: Diagnosis present

## 2021-08-18 DIAGNOSIS — I5023 Acute on chronic systolic (congestive) heart failure: Secondary | ICD-10-CM | POA: Diagnosis not present

## 2021-08-18 DIAGNOSIS — I5082 Biventricular heart failure: Secondary | ICD-10-CM | POA: Diagnosis not present

## 2021-08-18 DIAGNOSIS — L299 Pruritus, unspecified: Secondary | ICD-10-CM | POA: Diagnosis present

## 2021-08-18 DIAGNOSIS — R0602 Shortness of breath: Secondary | ICD-10-CM | POA: Diagnosis not present

## 2021-08-18 DIAGNOSIS — I480 Paroxysmal atrial fibrillation: Secondary | ICD-10-CM | POA: Diagnosis present

## 2021-08-18 DIAGNOSIS — I5022 Chronic systolic (congestive) heart failure: Secondary | ICD-10-CM | POA: Diagnosis not present

## 2021-08-18 DIAGNOSIS — B9561 Methicillin susceptible Staphylococcus aureus infection as the cause of diseases classified elsewhere: Secondary | ICD-10-CM | POA: Diagnosis present

## 2021-08-18 DIAGNOSIS — Z7901 Long term (current) use of anticoagulants: Secondary | ICD-10-CM

## 2021-08-18 DIAGNOSIS — Z6841 Body Mass Index (BMI) 40.0 and over, adult: Secondary | ICD-10-CM

## 2021-08-18 DIAGNOSIS — R7881 Bacteremia: Secondary | ICD-10-CM | POA: Diagnosis not present

## 2021-08-18 DIAGNOSIS — D631 Anemia in chronic kidney disease: Secondary | ICD-10-CM | POA: Diagnosis present

## 2021-08-18 DIAGNOSIS — N186 End stage renal disease: Secondary | ICD-10-CM | POA: Diagnosis not present

## 2021-08-18 DIAGNOSIS — J811 Chronic pulmonary edema: Secondary | ICD-10-CM | POA: Diagnosis not present

## 2021-08-18 DIAGNOSIS — Z813 Family history of other psychoactive substance abuse and dependence: Secondary | ICD-10-CM

## 2021-08-18 DIAGNOSIS — J810 Acute pulmonary edema: Secondary | ICD-10-CM

## 2021-08-18 DIAGNOSIS — N2581 Secondary hyperparathyroidism of renal origin: Secondary | ICD-10-CM | POA: Diagnosis not present

## 2021-08-18 DIAGNOSIS — T8249XA Other complication of vascular dialysis catheter, initial encounter: Secondary | ICD-10-CM | POA: Diagnosis not present

## 2021-08-18 DIAGNOSIS — E8770 Fluid overload, unspecified: Secondary | ICD-10-CM

## 2021-08-18 DIAGNOSIS — I5020 Unspecified systolic (congestive) heart failure: Secondary | ICD-10-CM | POA: Diagnosis not present

## 2021-08-18 DIAGNOSIS — T8241XA Breakdown (mechanical) of vascular dialysis catheter, initial encounter: Principal | ICD-10-CM | POA: Diagnosis present

## 2021-08-18 DIAGNOSIS — L298 Other pruritus: Secondary | ICD-10-CM | POA: Diagnosis not present

## 2021-08-18 DIAGNOSIS — Z8249 Family history of ischemic heart disease and other diseases of the circulatory system: Secondary | ICD-10-CM

## 2021-08-18 DIAGNOSIS — Z79899 Other long term (current) drug therapy: Secondary | ICD-10-CM

## 2021-08-18 DIAGNOSIS — R053 Chronic cough: Secondary | ICD-10-CM | POA: Diagnosis present

## 2021-08-18 DIAGNOSIS — Z8614 Personal history of Methicillin resistant Staphylococcus aureus infection: Secondary | ICD-10-CM

## 2021-08-18 DIAGNOSIS — I3139 Other pericardial effusion (noninflammatory): Secondary | ICD-10-CM | POA: Diagnosis not present

## 2021-08-18 DIAGNOSIS — I517 Cardiomegaly: Secondary | ICD-10-CM | POA: Diagnosis not present

## 2021-08-18 DIAGNOSIS — Z888 Allergy status to other drugs, medicaments and biological substances status: Secondary | ICD-10-CM

## 2021-08-18 LAB — I-STAT VENOUS BLOOD GAS, ED
Acid-base deficit: 2 mmol/L (ref 0.0–2.0)
Bicarbonate: 23 mmol/L (ref 20.0–28.0)
Calcium, Ion: 1.17 mmol/L (ref 1.15–1.40)
HCT: 30 % — ABNORMAL LOW (ref 39.0–52.0)
Hemoglobin: 10.2 g/dL — ABNORMAL LOW (ref 13.0–17.0)
O2 Saturation: 92 %
Potassium: 3.8 mmol/L (ref 3.5–5.1)
Sodium: 136 mmol/L (ref 135–145)
TCO2: 24 mmol/L (ref 22–32)
pCO2, Ven: 40.3 mmHg — ABNORMAL LOW (ref 44.0–60.0)
pH, Ven: 7.365 (ref 7.250–7.430)
pO2, Ven: 66 mmHg — ABNORMAL HIGH (ref 32.0–45.0)

## 2021-08-18 LAB — COMPREHENSIVE METABOLIC PANEL
ALT: 147 U/L — ABNORMAL HIGH (ref 0–44)
AST: 72 U/L — ABNORMAL HIGH (ref 15–41)
Albumin: 3.5 g/dL (ref 3.5–5.0)
Alkaline Phosphatase: 99 U/L (ref 38–126)
Anion gap: 15 (ref 5–15)
BUN: 53 mg/dL — ABNORMAL HIGH (ref 6–20)
CO2: 21 mmol/L — ABNORMAL LOW (ref 22–32)
Calcium: 9.5 mg/dL (ref 8.9–10.3)
Chloride: 99 mmol/L (ref 98–111)
Creatinine, Ser: 9.91 mg/dL — ABNORMAL HIGH (ref 0.61–1.24)
GFR, Estimated: 6 mL/min — ABNORMAL LOW (ref >60)
Glucose, Bld: 94 mg/dL (ref 70–99)
Potassium: 3.9 mmol/L (ref 3.5–5.1)
Sodium: 135 mmol/L (ref 135–145)
Total Bilirubin: 1.8 mg/dL — ABNORMAL HIGH (ref 0.3–1.2)
Total Protein: 7.6 g/dL (ref 6.5–8.1)

## 2021-08-18 LAB — CBC WITH DIFFERENTIAL/PLATELET
Abs Immature Granulocytes: 0.03 10*3/uL (ref 0.00–0.07)
Basophils Absolute: 0.1 10*3/uL (ref 0.0–0.1)
Basophils Relative: 1 %
Eosinophils Absolute: 0.3 10*3/uL (ref 0.0–0.5)
Eosinophils Relative: 4 %
HCT: 29.7 % — ABNORMAL LOW (ref 39.0–52.0)
Hemoglobin: 9.6 g/dL — ABNORMAL LOW (ref 13.0–17.0)
Immature Granulocytes: 0 %
Lymphocytes Relative: 20 %
Lymphs Abs: 1.8 10*3/uL (ref 0.7–4.0)
MCH: 32.1 pg (ref 26.0–34.0)
MCHC: 32.3 g/dL (ref 30.0–36.0)
MCV: 99.3 fL (ref 80.0–100.0)
Monocytes Absolute: 0.6 10*3/uL (ref 0.1–1.0)
Monocytes Relative: 6 %
Neutro Abs: 5.9 10*3/uL (ref 1.7–7.7)
Neutrophils Relative %: 69 %
Platelets: 282 10*3/uL (ref 150–400)
RBC: 2.99 MIL/uL — ABNORMAL LOW (ref 4.22–5.81)
RDW: 19.4 % — ABNORMAL HIGH (ref 11.5–15.5)
WBC: 8.7 10*3/uL (ref 4.0–10.5)
nRBC: 0 % (ref 0.0–0.2)

## 2021-08-18 LAB — LIPASE, BLOOD: Lipase: 38 U/L (ref 11–51)

## 2021-08-18 LAB — BRAIN NATRIURETIC PEPTIDE: B Natriuretic Peptide: 653.3 pg/mL — ABNORMAL HIGH (ref 0.0–100.0)

## 2021-08-18 LAB — TROPONIN I (HIGH SENSITIVITY)
Troponin I (High Sensitivity): 26 ng/L — ABNORMAL HIGH (ref <18)
Troponin I (High Sensitivity): 26 ng/L — ABNORMAL HIGH (ref <18)

## 2021-08-18 LAB — RESP PANEL BY RT-PCR (FLU A&B, COVID) ARPGX2
Influenza A by PCR: NEGATIVE
Influenza B by PCR: NEGATIVE
SARS Coronavirus 2 by RT PCR: NEGATIVE

## 2021-08-18 IMAGING — CT CT ANGIO CHEST
2 of 6 series · 18 of 46 positions shown · IV contrast (APPLIED)
Comparison: CT chest dated [DATE]

CLINICAL DATA: Shortness of breath

EXAM:
CT ANGIOGRAPHY CHEST WITH CONTRAST
TECHNIQUE: Multidetector CT imaging of the chest was performed using the
standard protocol during bolus administration of intravenous
contrast. Multiplanar CT image reconstructions and MIPs were
obtained to evaluate the vascular anatomy.

[Series 6: thins · axial · 0.98mm/px · z∈[-48,+290]mm · 15 of 372 slices shown]
[im 17/372  lung]
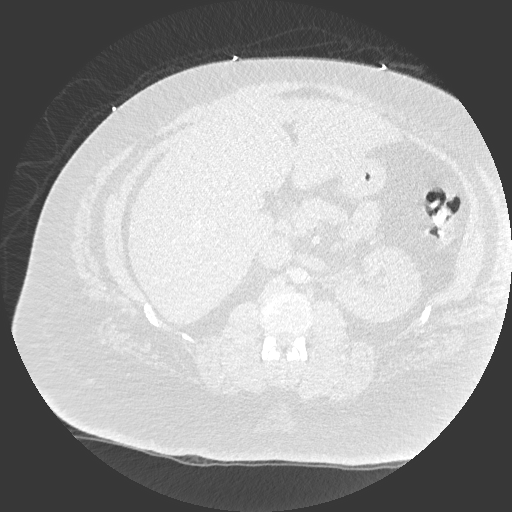
[im 49/372  soft-tissue]
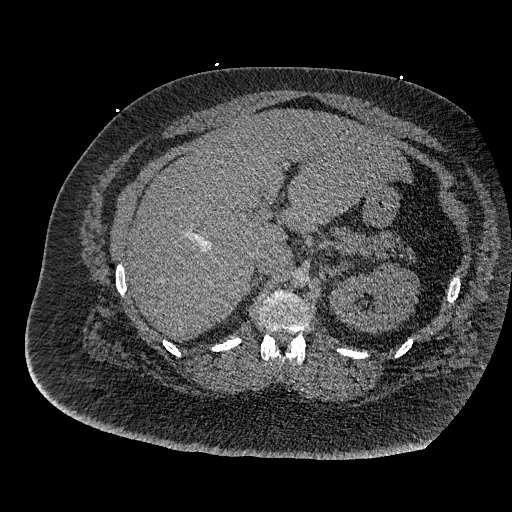
[im 65/372  lung]
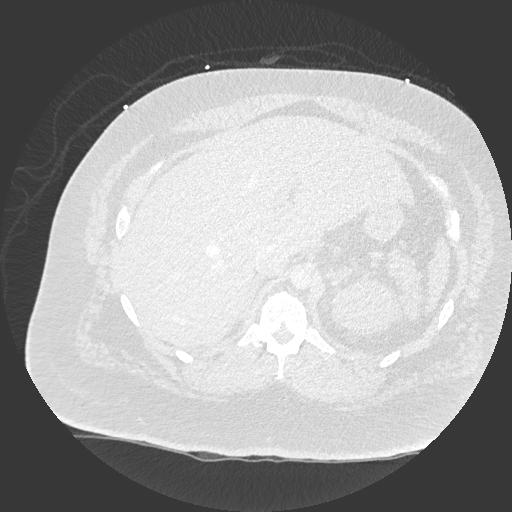
[im 97/372  soft-tissue]
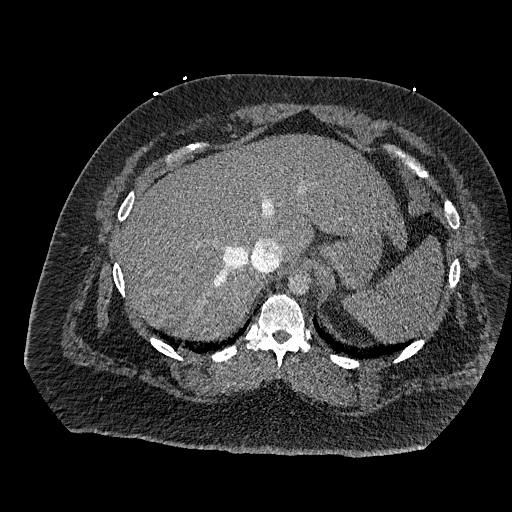
[im 113/372  lung]
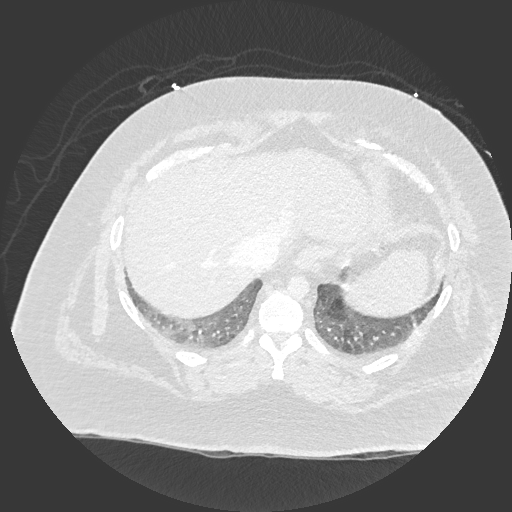
[im 146/372  soft-tissue]
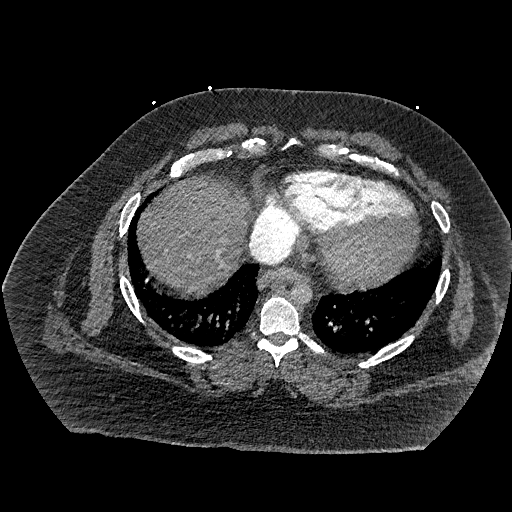
[im 162/372  lung]
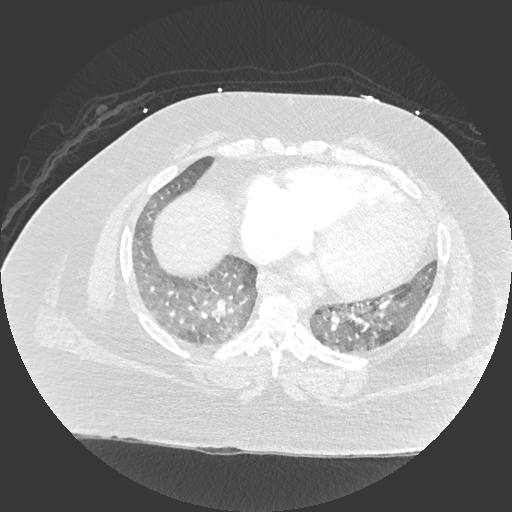
[im 194/372  soft-tissue]
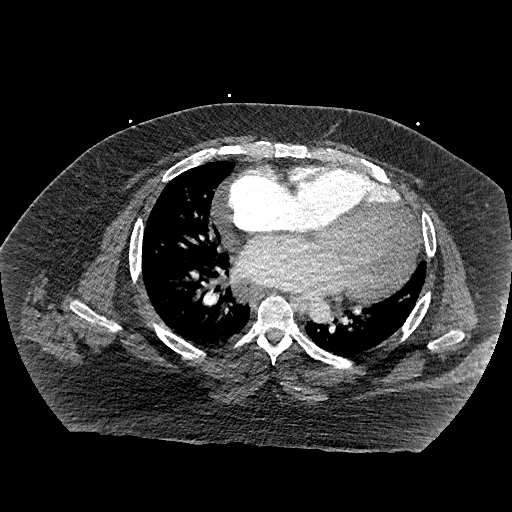
[im 210/372  lung]
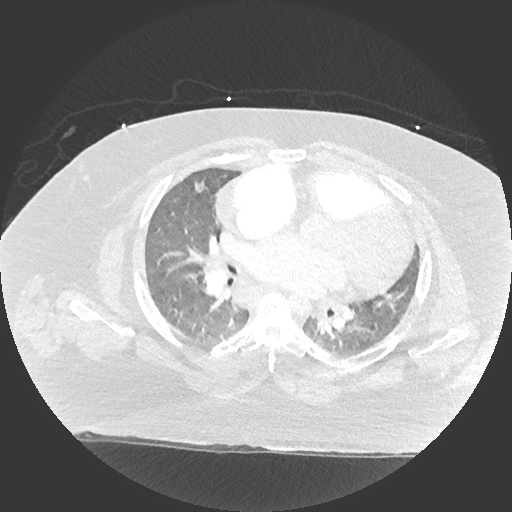
[im 226/372  soft-tissue]
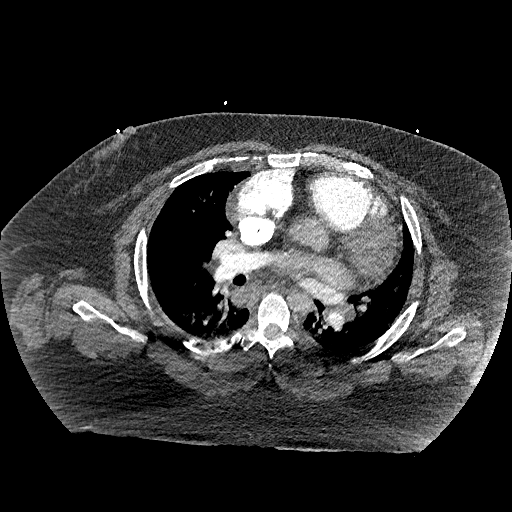
[im 259/372  lung]
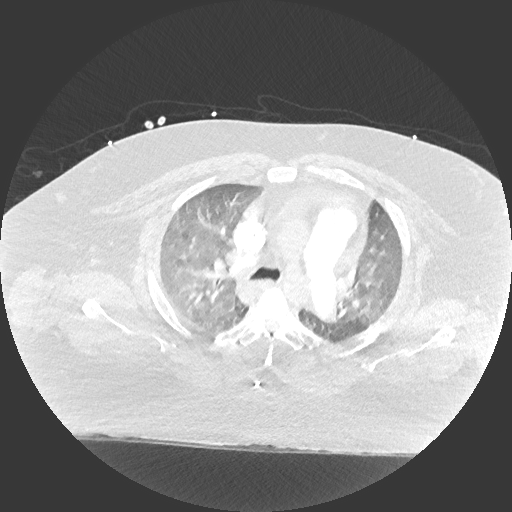
[im 275/372  soft-tissue]
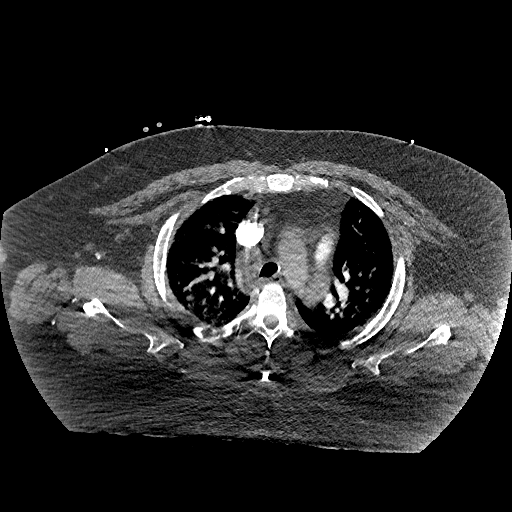
[im 307/372  lung]
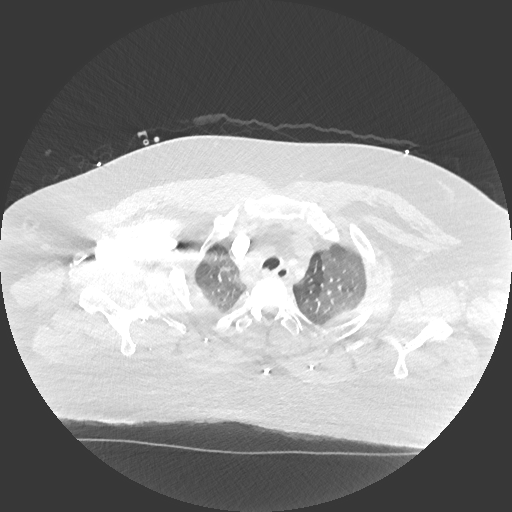
[im 323/372  soft-tissue]
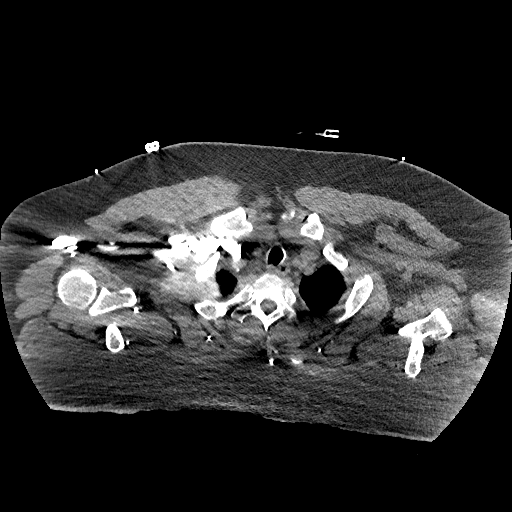
[im 355/372  lung]
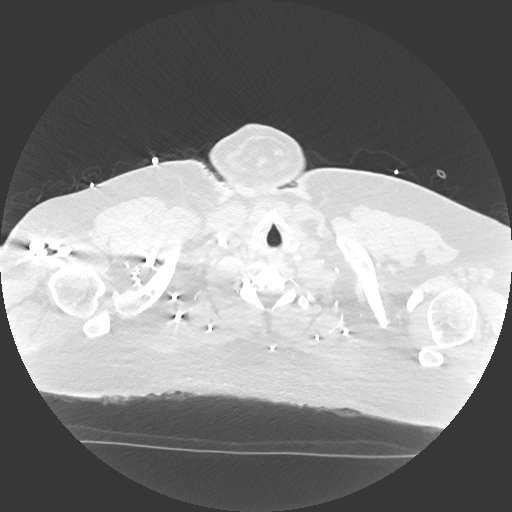

[Series 8: coronal mpr · coronal · 0.77mm/px · 3 of 199 slices shown]
[im 50/199  soft-tissue]
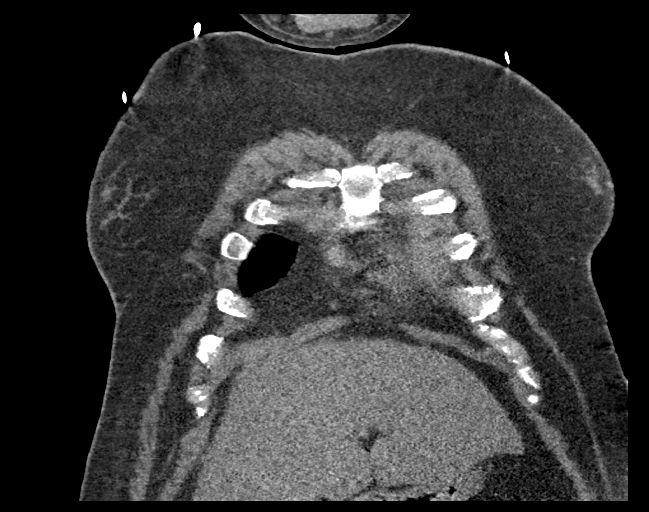
[im 100/199  soft-tissue]
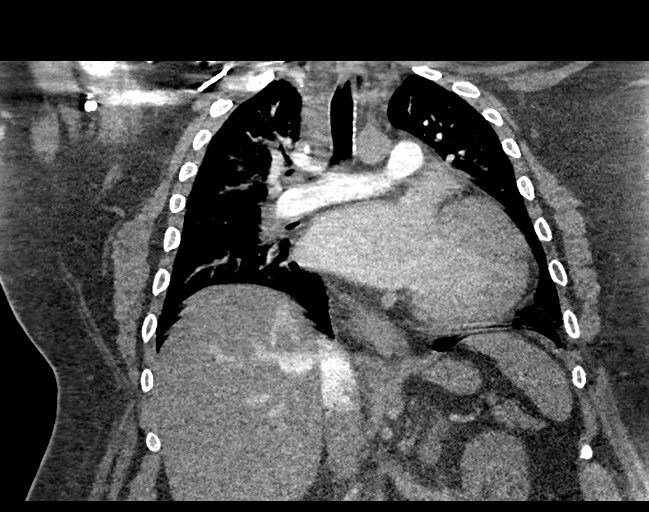
[im 149/199  soft-tissue]
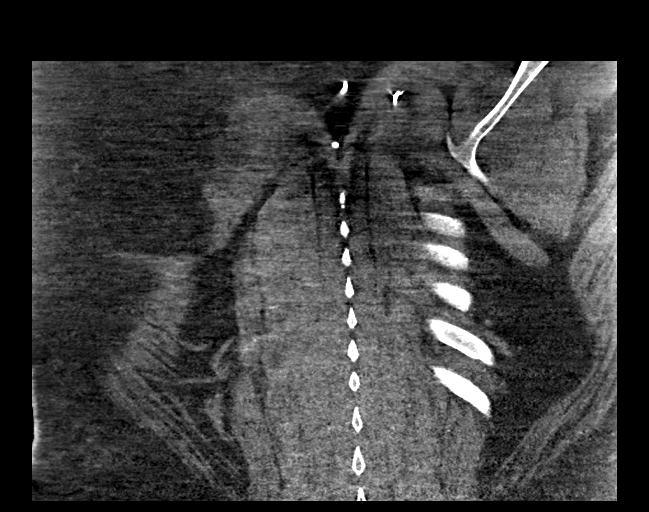

[18 of 46 positions shown; findings below may reference images not displayed]

RADIATION DOSE REDUCTION: This exam was performed according to the
departmental dose-optimization program which includes automated
exposure control, adjustment of the mA and/or kV according to
patient size and/or use of iterative reconstruction technique.

CONTRAST:  100mL OMNIPAQUE IOHEXOL 350 MG/ML SOLN
FINDINGS: Cardiovascular: Adequate contrast opacification of the pulmonary
arteries. Limited evaluation of the proximal segmental and
subsegmental pulmonary arteries due to patient body habitus
cardiomegaly. Small pericardial effusion. No significant coronary
artery calcifications or atherosclerotic disease of the thoracic
aorta. Right central venous line with tip near the superior
cavoatrial junction.

Mediastinum/Nodes: Enlarged mediastinal and hilar lymph nodes,
unchanged when compared with prior CT. Reference periaortic lymph
node located on series 3, image 99 measuring 1.8 cm in short axis.

Lungs/Pleura: Central airways are patent. Mild bilateral air
trapping. Diffuse bilateral ground-glass opacities. No pleural
effusion or pneumothorax.

Upper Abdomen: Reflux of contrast is noted in the hepatic veins
which can be seen in the setting of right heart dysfunction.

Musculoskeletal: No chest wall abnormality. No acute or significant
osseous findings.

Review of the MIP images confirms the above findings.
IMPRESSION: 1. Cardiomegaly and small pericardial effusion.
2. Diffuse bilateral ground-glass opacities, likely due to pulmonary
edema.
3. Bilateral air trapping, findings can be seen in the setting small
airways disease.

## 2021-08-18 IMAGING — DX DG CHEST 2V
2 series · 2 of 2 positions shown · non-contrast
Comparison: Chest x-ray [DATE].

CLINICAL DATA: 35-year-old male with history of shortness of breath
and cough.

EXAM:
CHEST - 2 VIEW

[chest lat]
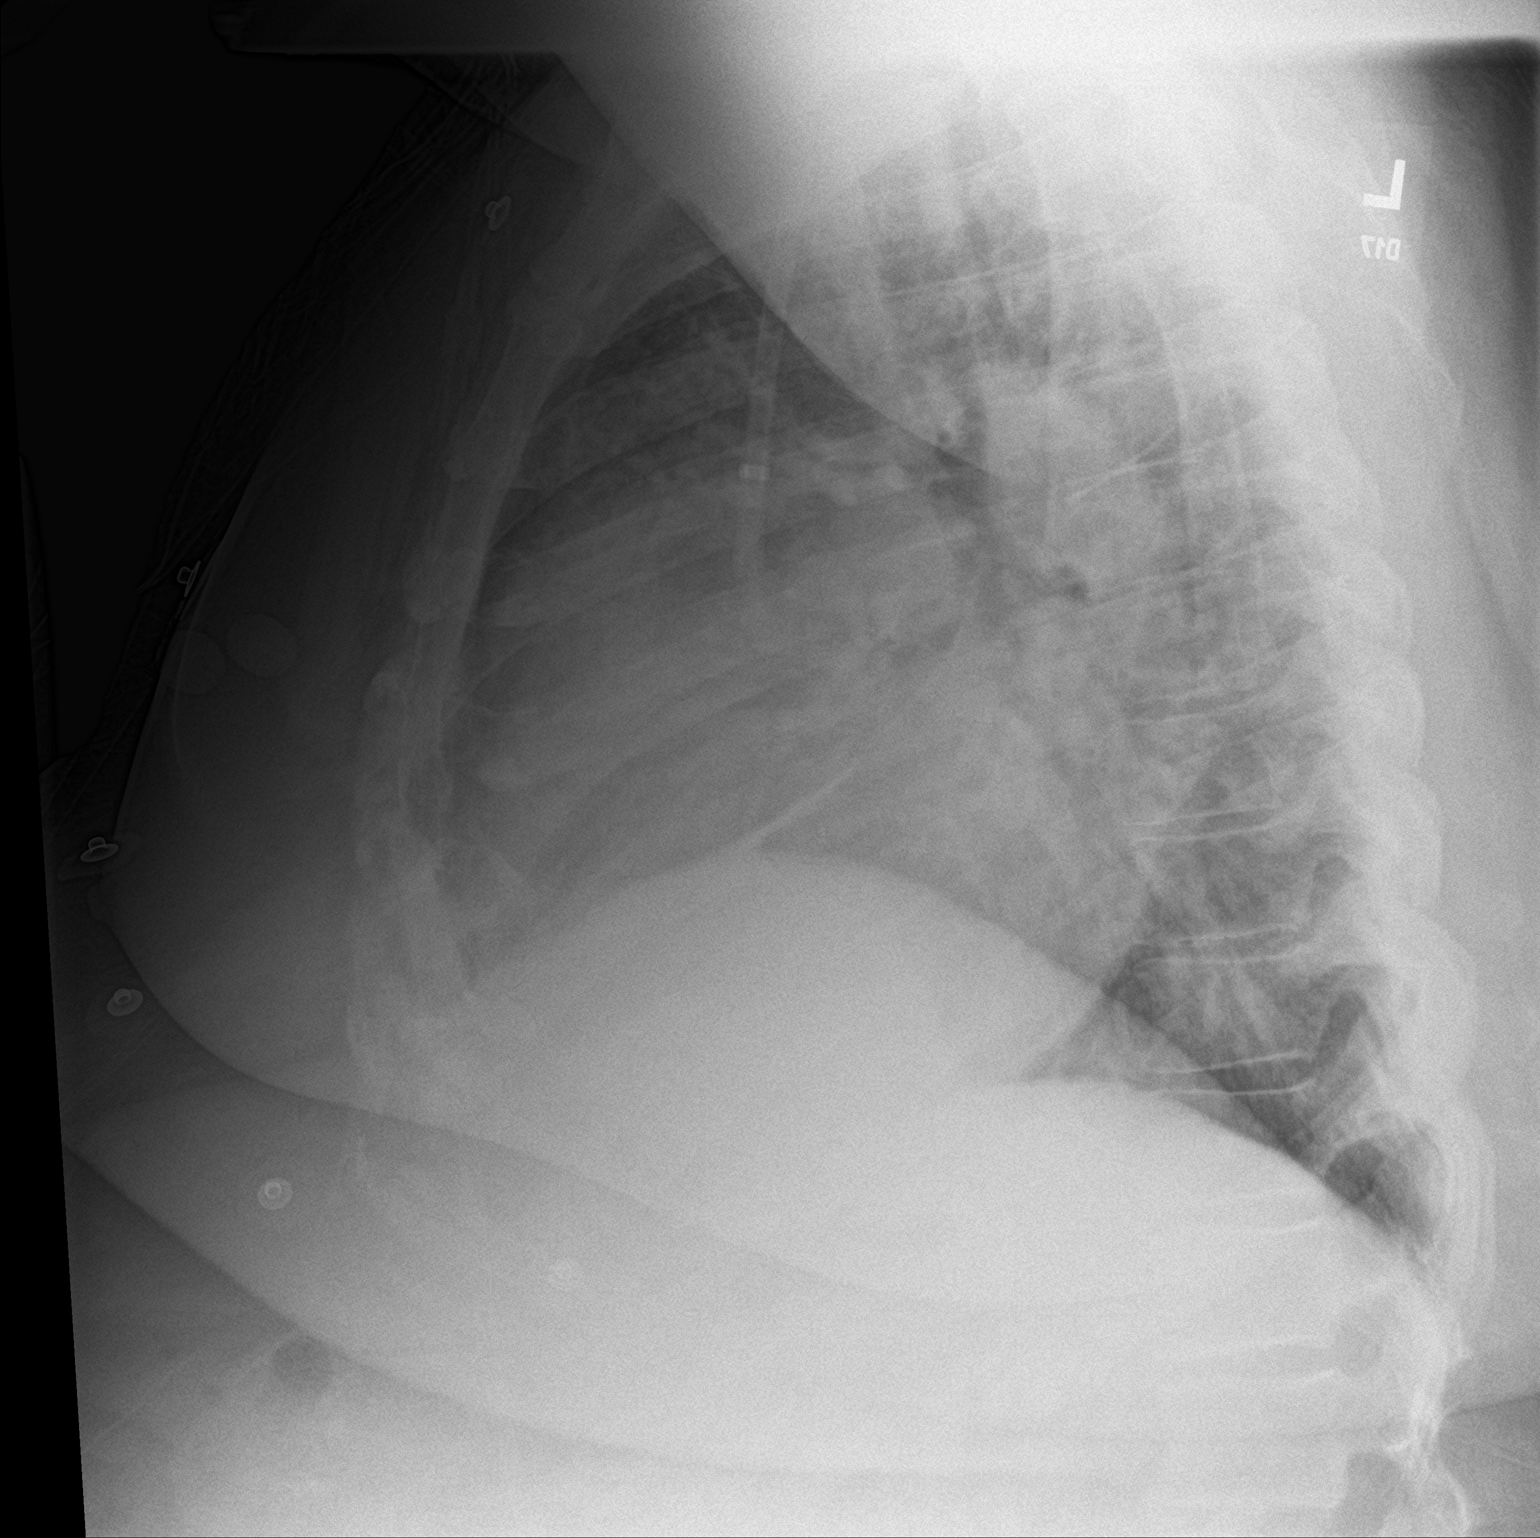

[chest ap]
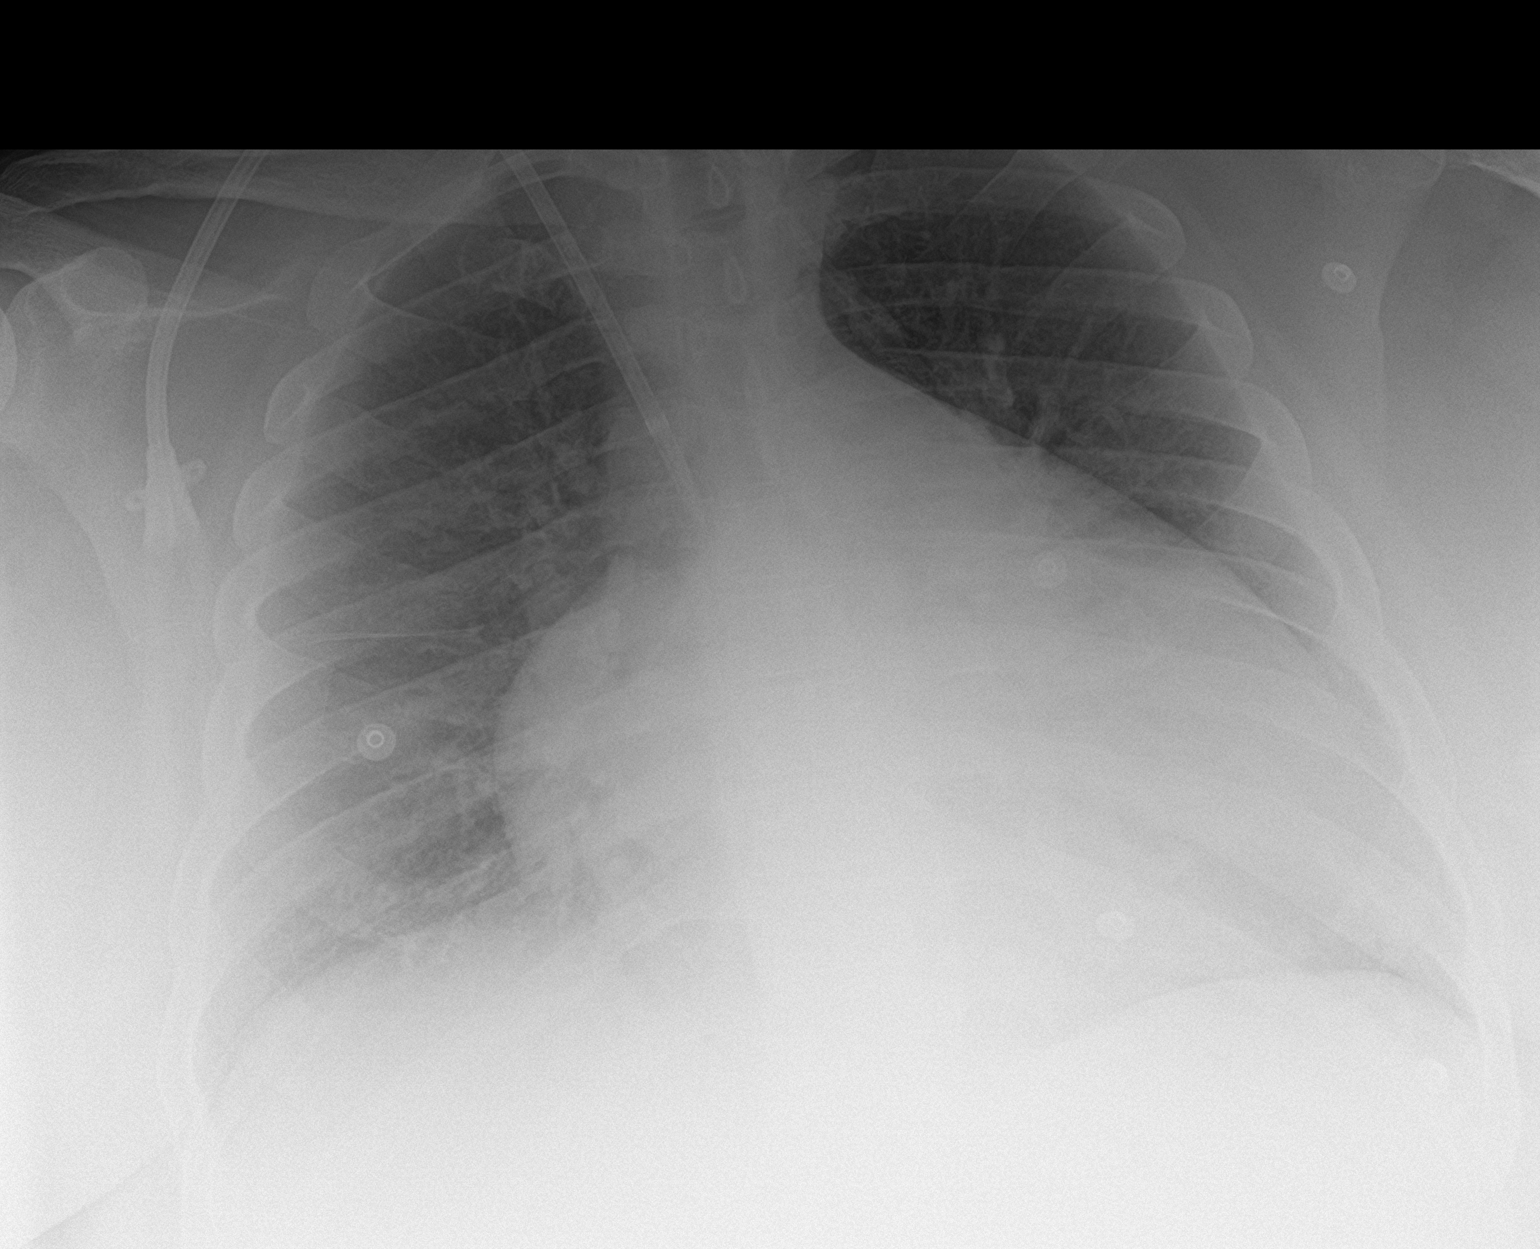

[2 of 2 positions shown; findings below may reference images not displayed]

FINDINGS: Right internal jugular PermCath with tip terminating in the distal
superior vena cava. Lung volumes are slightly low. No acute
consolidative airspace disease. No pleural effusions. No
pneumothorax. Cephalization of the pulmonary vasculature, without
frank pulmonary edema. Moderate cardiomegaly. Upper mediastinal
contours are within normal limits.
IMPRESSION: 1. Moderate cardiomegaly with pulmonary venous congestion, but no
frank pulmonary edema.

## 2021-08-18 MED ORDER — CEPHALEXIN 500 MG PO CAPS
500.0000 mg | ORAL_CAPSULE | Freq: Every day | ORAL | Status: DC
  Administered 2021-08-19 – 2021-08-21 (×3): 500 mg via ORAL
  Filled 2021-08-18 (×3): qty 1

## 2021-08-18 MED ORDER — HYDROXYZINE HCL 25 MG PO TABS
25.0000 mg | ORAL_TABLET | Freq: Three times a day (TID) | ORAL | Status: DC | PRN
  Administered 2021-08-18 – 2021-08-22 (×10): 25 mg via ORAL
  Filled 2021-08-18 (×12): qty 1

## 2021-08-18 MED ORDER — FAMOTIDINE 20 MG PO TABS
20.0000 mg | ORAL_TABLET | Freq: Two times a day (BID) | ORAL | Status: DC
  Administered 2021-08-18 – 2021-08-20 (×3): 20 mg via ORAL
  Filled 2021-08-18 (×3): qty 1

## 2021-08-18 MED ORDER — MIDODRINE HCL 5 MG PO TABS: 10.0000 mg | ORAL_TABLET | Freq: Three times a day (TID) | ORAL | Status: DC

## 2021-08-18 MED ORDER — APIXABAN 5 MG PO TABS
5.0000 mg | ORAL_TABLET | Freq: Two times a day (BID) | ORAL | Status: DC
  Administered 2021-08-18: 23:00:00 5 mg via ORAL
  Filled 2021-08-18: qty 1

## 2021-08-18 MED ORDER — ACETAMINOPHEN 650 MG RE SUPP: 650.0000 mg | Freq: Four times a day (QID) | RECTAL | Status: DC | PRN

## 2021-08-18 MED ORDER — BENZONATATE 100 MG PO CAPS
100.0000 mg | ORAL_CAPSULE | Freq: Two times a day (BID) | ORAL | Status: DC
  Administered 2021-08-18 – 2021-08-22 (×7): 100 mg via ORAL
  Filled 2021-08-18 (×7): qty 1

## 2021-08-18 MED ORDER — ACETAMINOPHEN 325 MG PO TABS: 650.0000 mg | ORAL_TABLET | Freq: Four times a day (QID) | ORAL | Status: DC | PRN

## 2021-08-18 MED ORDER — IOHEXOL 350 MG/ML SOLN
100.0000 mL | Freq: Once | INTRAVENOUS | Status: AC | PRN
  Administered 2021-08-18: 14:00:00 100 mL via INTRAVENOUS

## 2021-08-18 MED ORDER — AMIODARONE HCL 200 MG PO TABS
200.0000 mg | ORAL_TABLET | Freq: Every day | ORAL | Status: DC
  Administered 2021-08-19 – 2021-08-20 (×2): 200 mg via ORAL
  Filled 2021-08-18 (×2): qty 1

## 2021-08-18 MED ORDER — MIDODRINE HCL 5 MG PO TABS: 20.0000 mg | ORAL_TABLET | Freq: Three times a day (TID) | ORAL | Status: DC

## 2021-08-18 MED ORDER — LANTHANUM CARBONATE 500 MG PO CHEW
500.0000 mg | CHEWABLE_TABLET | Freq: Three times a day (TID) | ORAL | Status: DC
  Administered 2021-08-19 – 2021-08-22 (×6): 500 mg via ORAL
  Filled 2021-08-18 (×7): qty 1

## 2021-08-18 MED ORDER — AMITRIPTYLINE HCL 10 MG PO TABS
10.0000 mg | ORAL_TABLET | Freq: Every day | ORAL | Status: DC
  Administered 2021-08-18 – 2021-08-21 (×4): 10 mg via ORAL
  Filled 2021-08-18 (×5): qty 1

## 2021-08-18 NOTE — Consult Note (Signed)
[facility] Renal Consultation Note    Indication for Consultation:  Management of ESRD/hemodialysis; anemia, hypertension/volume and secondary hyperparathyroidism   HPI: Phillip Young is a 36 y.o. male with ESRD on HD since July 2022. PMH also HTN, HFrEF, EF 25-30%,  Afib, on Eliquis,  OSA, morbid obesity, GERD. He presented to the ED from his dialysis center with a malfunctioning dialysis catheter and SOB. He got TPA at his dialysis center but catheter did not function and he did not have dialysis today. He was supposed to go to [facility] for a catheter exchange but decided to come to the ED instead. He endorses ongoing dyspnea and coughing for weeks. Cough with nausea. Endorses orthopnea. He uses CPAP at night.  Also endorses chronic itching.  CXR shows venous congestion. Labs Na 135, K 3.9, CO2 21, BUN 53 Cr 9.9, WBC 8.7 Hgb 9.6.   Usual dialysis TTS at [facility]. Unable to dialyze today. Last dialyzed while admitted at [facility] 1/19-1/21/23 for dyspnea 2/2 volume overload. Weighed at his outpatient center today and was 12 kg over his dry weight.   Past Medical History:  Diagnosis Date   Biventricular congestive heart failure ([place])    Last Echo 11/2019 at [facility] reveals EF 20%   Class 3 severe obesity due to excess calories with serious comorbidity and body mass index (BMI) of 50.0 to 59.9 in adult [facility]) 02/26/2020   Essential hypertension 02/26/2020   GERD without esophagitis 02/26/2020   Hidradenitis suppurativa 02/26/2020   OSA (obstructive sleep apnea) 02/26/2020   Prediabetes 02/26/2020   Renal disorder    Past Surgical History:  Procedure Laterality Date   ABSCESS DRAINAGE     IR FLUORO GUIDE CV LINE RIGHT  03/10/2021   IR FLUORO GUIDE CV LINE RIGHT  04/22/2021   IR US GUIDE VASC ACCESS RIGHT  03/10/2021   IR US GUIDE VASC ACCESS RIGHT  04/22/2021   RIGHT HEART CATH N/A 03/06/2021   Procedure: RIGHT HEART CATH;  Surgeon: Jolaine Artist, MD;  Location:  [place] [facility];  Service: Cardiovascular;  Laterality: N/A;   RIGHT/LEFT HEART CATH AND CORONARY ANGIOGRAPHY N/A 03/04/2020   Procedure: RIGHT/LEFT HEART CATH AND CORONARY ANGIOGRAPHY;  Surgeon: Larey Dresser, MD;  Location: [place] [facility];  Service: Cardiovascular;  Laterality: N/A;   TEE WITHOUT CARDIOVERSION N/A 05/05/2021   Procedure: TRANSESOPHAGEAL ECHOCARDIOGRAM (TEE);  Surgeon: Larey Dresser, MD;  Location: [facility] ENDOSCOPY;  Service: Cardiovascular;  Laterality: N/A;   TEMPORARY DIALYSIS CATHETER  03/06/2021   Procedure: TEMPORARY DIALYSIS CATHETER;  Surgeon: Jolaine Artist, MD;  Location: [place] [facility];  Service: Cardiovascular;;   Family History  Problem Relation Age of Onset   Heart disease Mother    Hypertension Mother    Pulmonary Hypertension Mother    Drug abuse Father        died due to Heroin overdose   Social History:  reports that he quit smoking about 4 years ago. His smoking use included cigarettes. He smoked an average of 1 pack per day. He has quit using smokeless tobacco. He reports that he does not drink alcohol and does not use drugs. 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 Authorizing Provider  acetaminophen (TYLENOL) 325  MG tablet Take 2 tablets (650 mg total) by mouth every 6 (six) hours as needed for mild pain (or Fever >/= 101). 05/19/21   Samella Parr, NP  allopurinol (ZYLOPRIM) 100 MG tablet Take 1 tablet (100 mg total) by mouth daily. 05/19/21   Samella Parr, NP  amiodarone (PACERONE) 200 MG tablet Take 1 tablet (200 mg total) by mouth daily. 05/19/21   Samella Parr, NP  amitriptyline (ELAVIL) 10 MG tablet Take 1 tablet (10 mg total) by mouth at bedtime. 05/19/21   Samella Parr, NP  apixaban (ELIQUIS) 5 MG  TABS tablet Take 1 tablet (5 mg total) by mouth 2 (two) times daily. 03/26/21   Carollo, Farley Ly, NP  cephALEXin (KEFLEX) 500 MG capsule Take 1 capsule (500 mg total) by mouth daily. Take 1 at bedtime every day. 08/04/21 11/02/21  Vu, Rockey Situ, MD  cyclobenzaprine (FLEXERIL) 5 MG tablet Take 1 tablet (5 mg total) by mouth 3 (three) times daily as needed for muscle spasms. 05/19/21   Samella Parr, NP  docusate sodium (COLACE) 100 MG capsule Take 1 capsule (100 mg total) by mouth 2 (two) times daily. 05/19/21   Samella Parr, NP  ENTRESTO 97-103 MG Take 1 tablet by mouth 2 (two) times daily. 08/14/21   [provider]  famotidine (PEPCID) 20 MG tablet Take 1 tablet (20 mg total) by mouth 2 (two) times daily. 05/19/21   Samella Parr, NP  hydrOXYzine (ATARAX) 25 MG tablet Take 25 mg by mouth 2 (two) times daily. 08/17/21   [provider]  lactulose (CHRONULAC) 10 GM/15ML solution Take 15 mLs (10 g total) by mouth at bedtime. 05/19/21   Samella Parr, NP  lanthanum (FOSRENOL) 500 MG chewable tablet Chew 500 mg by mouth 3 (three) times daily with meals.    [provider]  linaclotide Rolan Lipa) 145 MCG CAPS capsule Take 1 capsule (145 mcg total) by mouth daily before breakfast. 05/19/21   Samella Parr, NP  midodrine (PROAMATINE) 10 MG tablet Take 2 tablets (20 mg total) by mouth 3 (three) times daily with meals. 05/19/21   Samella Parr, NP  morphine (MS CONTIN) 15 MG 12 hr tablet Take 1 tablet (15 mg total) by mouth every 12 (twelve) hours. 05/19/21   Samella Parr, NP  multivitamin (RENA-VIT) TABS tablet Take 1 tablet by mouth at bedtime. 05/19/21   Samella Parr, NP  ondansetron (ZOFRAN) 4 MG/2ML SOLN injection Inject 2 mLs (4 mg total) into the vein every 6 (six) hours as needed for nausea or vomiting. 05/14/21   Samella Parr, NP  Oxycodone HCl 10 MG TABS Take 1 tablet (10 mg total) by mouth every 6 (six) hours as needed. 05/19/21   Samella Parr, NP   polyethylene glycol (MIRALAX / GLYCOLAX) 17 g packet Take 17 g by mouth 2 (two) times daily. 05/19/21   Samella Parr, NP  SOAANZ 40 MG TABS Take 80 mg by mouth daily. 04/07/21   [provider]  sodium chloride (OCEAN) 0.65 % SOLN nasal spray Place 1 spray into both nostrils as needed for congestion. 05/14/21   Samella Parr, NP   No current facility-administered medications for this encounter.   Current Outpatient Medications  Medication Sig Dispense Refill   acetaminophen (TYLENOL) 325 MG tablet Take 2 tablets (650 mg total) by mouth every 6 (six) hours as needed for mild pain (or Fever >/= 101).     allopurinol (  ZYLOPRIM) 100 MG tablet Take 1 tablet (100 mg total) by mouth daily. 30 tablet 0   amiodarone (PACERONE) 200 MG tablet Take 1 tablet (200 mg total) by mouth daily. 30 tablet 0   amitriptyline (ELAVIL) 10 MG tablet Take 1 tablet (10 mg total) by mouth at bedtime. 30 tablet 0   apixaban (ELIQUIS) 5 MG TABS tablet Take 1 tablet (5 mg total) by mouth 2 (two) times daily. 60 tablet 11   cephALEXin (KEFLEX) 500 MG capsule Take 1 capsule (500 mg total) by mouth daily. Take 1 at bedtime every day. 30 capsule 2   cyclobenzaprine (FLEXERIL) 5 MG tablet Take 1 tablet (5 mg total) by mouth 3 (three) times daily as needed for muscle spasms. 30 tablet 0   docusate sodium (COLACE) 100 MG capsule Take 1 capsule (100 mg total) by mouth 2 (two) times daily. 10 capsule 0   ENTRESTO 97-103 MG Take 1 tablet by mouth 2 (two) times daily.     famotidine (PEPCID) 20 MG tablet Take 1 tablet (20 mg total) by mouth 2 (two) times daily. 60 tablet 0   hydrOXYzine (ATARAX) 25 MG tablet Take 25 mg by mouth 2 (two) times daily.     lactulose (CHRONULAC) 10 GM/15ML solution Take 15 mLs (10 g total) by mouth at bedtime. 236 mL 0   lanthanum (FOSRENOL) 500 MG chewable tablet Chew 500 mg by mouth 3 (three) times daily with meals.     linaclotide (LINZESS) 145 MCG CAPS capsule Take 1 capsule (145 mcg  total) by mouth daily before breakfast. 30 capsule 0   midodrine (PROAMATINE) 10 MG tablet Take 2 tablets (20 mg total) by mouth 3 (three) times daily with meals. 90 tablet 0   morphine (MS CONTIN) 15 MG 12 hr tablet Take 1 tablet (15 mg total) by mouth every 12 (twelve) hours. 60 tablet 0   multivitamin (RENA-VIT) TABS tablet Take 1 tablet by mouth at bedtime. 30 tablet 0   ondansetron (ZOFRAN) 4 MG/2ML SOLN injection Inject 2 mLs (4 mg total) into the vein every 6 (six) hours as needed for nausea or vomiting. 2 mL 0   Oxycodone HCl 10 MG TABS Take 1 tablet (10 mg total) by mouth every 6 (six) hours as needed. 30 tablet 0   polyethylene glycol (MIRALAX / GLYCOLAX) 17 g packet Take 17 g by mouth 2 (two) times daily. 14 each 0   SOAANZ 40 MG TABS Take 80 mg by mouth daily.     sodium chloride (OCEAN) 0.65 % SOLN nasal spray Place 1 spray into both nostrils as needed for congestion.  0     ROS: As per HPI otherwise negative.  Physical Exam: Vitals:   08/18/21 1456 08/18/21 1512 08/18/21 1520 08/18/21 1530  BP: (!) 96/54   (!) 102/56  Pulse: (!) 101 (!) 101  (!) 102  Resp: (!) 22 (!) 32  (!) 30  Temp:      TempSrc:      SpO2: 98% 97%  96%  Weight:   (!) 183.7 kg   Height:   6' (1.829 m)      General: Alert, sitting up in bed, nad, on 2L [place]  Head: NCAT sclera not icteric MMM Neck: Large neck, no JVD appreciated  Lungs: Diminished lung sounds bilaterally  Heart: RRR, no murmur, rub, or gallop  Abdomen: soft non-tender, bowel sounds normal, no masses  Lower extremities: Trace LE edema  no open wounds  Neuro: A & O X  3. Moves all extremities spontaneously. Psych:  Responds to questions appropriately with a normal affect. Dialysis Access: R IJ TDC in place   Labs: Basic Metabolic Panel: Recent Labs  Lab 08/18/21 1132 08/18/21 1223  NA 135 136  K 3.9 3.8  CL 99  --   CO2 21*  --   GLUCOSE 94  --   BUN 53*  --   CREATININE 9.91*  --   CALCIUM 9.5  --    Liver Function  Tests: Recent Labs  Lab 08/18/21 1132  AST 72*  ALT 147*  ALKPHOS 99  BILITOT 1.8*  PROT 7.6  ALBUMIN 3.5   Recent Labs  Lab 08/18/21 1132  LIPASE 38   No results for input(s): AMMONIA in the last 168 hours. CBC: Recent Labs  Lab 08/18/21 1132 08/18/21 1223  WBC 8.7  --   NEUTROABS 5.9  --   HGB 9.6* 10.2*  HCT 29.7* 30.0*  MCV 99.3  --   PLT 282  --    Cardiac Enzymes: No results for input(s): CKTOTAL, CKMB, CKMBINDEX, TROPONINI in the last 168 hours. CBG: No results for input(s): GLUCAP in the last 168 hours. Iron Studies: No results for input(s): IRON, TIBC, TRANSFERRIN, FERRITIN in the last 72 hours. Studies/Results: DG Chest 2 View  Result Date: 08/18/2021 CLINICAL DATA:  36 year old male with history of shortness of breath and cough. EXAM: CHEST - 2 VIEW COMPARISON:  Chest x-ray 08/13/2021. FINDINGS: Right internal jugular PermCath with tip terminating in the distal superior vena cava. Lung volumes are slightly low. No acute consolidative airspace disease. No pleural effusions. No pneumothorax. Cephalization of the pulmonary vasculature, without frank pulmonary edema. Moderate cardiomegaly. Upper mediastinal contours are within normal limits. IMPRESSION: 1. Moderate cardiomegaly with pulmonary venous congestion, but no frank pulmonary edema. Electronically Signed   By: Vinnie Langton M.D.   On: 08/18/2021 10:58    Dialysis Orders:  Unit: [place]  Time: 4:00 EDW: 175 kg  Flows: 425  [place]: 4K/2.25 Ca Access: TDC  Heparin: None (contraindication) ESA: Mircera 30 q 2 weeks (last 1/12)  VDRA: Hectorol 2 TIW   Assessment/Plan: ESRD -  Presented with malfunctioning dialysis catheter. Unable to dialyze at his center today.  Will consult IR for cathter exchange in the am then dialysis. Also needs fistula. Has missed outpatient appointments with [facility]. He's agreeable for AVF now -plan to consult in the am.  Volume overload - Missed dialysis and now 12 kg over dry weight.  Plan max UF with HD. Plan for serial treatments.  Hypertension - BP acceptable Anemia  - Hgb 9.6 On ESA. Next due 1/26.  Metabolic bone disease -  Calcium ok. Continue home binders, Hectrolol. Check Phos with HD.  AFib- on Eliquis  OSA - continue CPAP  HFrEF - Try to optimize volume with dialysis as able.  Nutrition - Renal diet with fluid restricion.   Lynnda Child PA-C [facility] 08/18/2021, 3:58 PM

## 2021-08-18 NOTE — H&P (Deleted)
Date: 08/18/2021               Patient Name:  Phillip Young MRN: [id number]  DOB: 09/13/1985 Age / Sex: 36 y.o., male   PCP: Phillip Pope, MD         Medical Service: Internal Medicine Teaching Service         Attending Physician: Charise Killian, MD    First Contact: Mitzie Na, MD Pager: [phone]  Second Contact: Gaylan Gerold, DO Pager: Tenny Craw       After Hours (After 5p/  First Contact Pager: [phone]  weekends / holidays): Second Contact Pager: [phone]    Chief Complaint: HD catheter malfunction    History of Present Illness:  Phillip Young is a 36 y.o. male with a pertinent PMH of HTN, T2DM, paroxysmal fibrillation on Elqiuis, chronic HFrEF (EF 20%), OSA on CPAP, and ESRD on HD TTS presenting to the ED from his dialysis center for a malfunctioning dialysis catheter.   He reports arriving to this outpatient HD center today 1/24, but nursing staff was unable to fully access his dialysis catheter, and thus was unable to obtain dialysis. This prompted him to come to the ED. He last received HD on 1/21 when admitted to [facility] from 1/19-1/21 for hypervolemia, and was treated with Lasix and HD.   Patient reports worsening SHOB, DOE, and nonproductive cough for the past 2 weeks, though these symptoms are present at baseline for years now. Requires 2L supplemental O2 at baseline but reports needing up to 4 liters recently 2/2 ongoing symptoms. Reports NBNB vomiting 2/2 cough x past couple of days. He denies fevers, chills, and urinary symptoms. Continues to produce a urine; reports going 4-5 times a day "a little at a time".   On arrival, the patient was afebrile, tachycardic in the low 100s, tachypneic to the 30s, hypertensive BP 143/129, saturating 92% on room air, and subsequently placed on 2 L O2 via nasal cannula. Initial ED workup with BNP elevated to 653, CBC without leukocytosis, stable anemia of 9.6, VBG without an acidosis, troponin mildly elevated to 26 -> 26, and CMP  with K of 3.9. CTA showing pulmonary edema.   Nephrology consulted in the ED; plan for IR catheter replacement and serial HD inpatient.   Meds:  Current Meds  Medication Sig   amiodarone (PACERONE) 200 MG tablet Take 1 tablet (200 mg total) by mouth daily.   amitriptyline (ELAVIL) 10 MG tablet Take 1 tablet (10 mg total) by mouth at bedtime.   apixaban (ELIQUIS) 5 MG TABS tablet Take 1 tablet (5 mg total) by mouth 2 (two) times daily.   cephALEXin (KEFLEX) 500 MG capsule Take 1 capsule (500 mg total) by mouth daily. Take 1 at bedtime every day.   cyclobenzaprine (FLEXERIL) 5 MG tablet Take 1 tablet (5 mg total) by mouth 3 (three) times daily as needed for muscle spasms.   famotidine (PEPCID) 20 MG tablet Take 1 tablet (20 mg total) by mouth 2 (two) times daily. (Patient taking differently: Take 20 mg by mouth 2 (two) times daily as needed for heartburn or indigestion.)   lanthanum (FOSRENOL) 500 MG chewable tablet Chew 500 mg by mouth 3 (three) times daily with meals.   midodrine (PROAMATINE) 10 MG tablet Take 2 tablets (20 mg total) by mouth 3 (three) times daily with meals.   multivitamin (RENA-VIT) TABS tablet Take 1 tablet by mouth at bedtime.   ONDANSETRON HCL PO Take 1 tablet by mouth every 8 (eight)  hours as needed (nausea or vomiting.).   sodium chloride (OCEAN) 0.65 % SOLN nasal spray Place 1 spray into both nostrils as needed for congestion.     Allergies: Allergies as of 08/18/2021 - Review Complete 08/18/2021  Allergen Reaction Noted   Coreg [carvedilol] Shortness Of Breath and Diarrhea 07/07/2020   Heparin Other (See Comments) 03/05/2021   Past Medical History:  Diagnosis Date   Biventricular congestive heart failure ([place])    Last Echo 11/2019 at [facility] reveals EF 20%   Class 3 severe obesity due to excess calories with serious comorbidity and body mass index (BMI) of 50.0 to 59.9 in adult [facility]) 02/26/2020   Essential hypertension 02/26/2020   GERD without  esophagitis 02/26/2020   Hidradenitis suppurativa 02/26/2020   OSA (obstructive sleep apnea) 02/26/2020   Prediabetes 02/26/2020   Renal disorder    Family History  Problem Relation Age of Onset   Heart disease Mother    Hypertension Mother    Pulmonary Hypertension Mother    Drug abuse Father        died due to Heroin overdose    Social History:   Lives with mother and uncle  Employment- drives a school bus   Denies tobacco, alcohol, and illicit drug use Per chart review: Quit smoking in Oct 2019, used to smoke about 6 cigars per day for about 2 years. IADLs/ADLs- can person independently at baseline   Review of Systems: A complete ROS was negative except as per HPI.    Physical Exam: Blood pressure (!) 127/110, pulse (!) 101, temperature 98.4 F (36.9 C), temperature source Oral, resp. rate 16, height 6' (1.829 m), weight (!) 183.7 kg, SpO2 97 %.  Constitutional: alert, ill-appearing, and in moderate distress  Neck: difficult to assess JVD d/t body habitus  Cardiovascular: regular rhythm, tachycardic, no m/r/g, 1+ bilateral LE pitting edema  Pulmonary/Chest: increased work of breathing on 4L [place], bilateral crackles in lower lung fields.  Abdominal: soft, non-tender to palpation, distended Neurological: A&O x 3, follows commands  Skin: cool and dry  EKG: ST  CXR: bilateral pulmonary edema   Assessment & Plan by Problem: Principal Problem:   Volume overload   Phillip Young is a 36 y.o. male with a pertinent PMH of HTN, T2DM, paroxysmal fibrillation on Elqiuis, chronic HFrEF (EF 20%), OSA on CPAP, and ESRD on HD TTS admitted for HD cath replacement.    Volume overload 2/2 missed HD session Malfunctioning HD catheter  ESRD on HD TTS  Chronic HFrEF (EF 20%) Recent admission in Sept for cardiogenic shock and cardiorenal syndrome with progression to ESRD. Follows closely with the Heart Failure team. Last LVEF from 04/2021 with LVEF 20% with global hypokinesis. Unclear  if taking Entresto (recently refilled), and takes Midodrine 20 tid. Missed HD in setting of right IJ catheter malfunction and is now fluid overload but difficult to appreciate on exam. 12 kg above his dry weight and CTA with pulmonary edema. No electrolyte derangements. Nephrology on board, with plans for serial HD sessions after IR guided HD cath replacement tmrw. No permanent access yet as pt did not follow up with vascular surgery as an outpatient, plan for [facility] consult tmrw for fistula, per nephrology.  - Right IR catheter replacement per IR   - Serial HD per nephrology  - [facility] consult in AM for fistula  - Fluid restriction  - Strict I/O's and daily weights  - Started Midodrine 10 mg tid (takes 20 tid at home) - Renal panel  PAF In SR currently. Rate in 100's. Takes Amiodarone and Eliquis. - Continue Amiodarone  - Continue Eliquis     DM2 Last A1c 6.5 (02/11/21). Not on any diabetes medications.  - SSI - CBG monitoring  - A1c  Hx of HTN Normotensive on admission. Not on an antihypertensive agents.    Anemia of chronic diease  Chronic and stable, Hgb 9.6. On ESA.    OSA on CPAP - Continue CPAP   Class 3 obesity  - Encourage weight loss   MSSA septic arthritis of left sacroiliac joint and sacral osteomyelitis (03/2021) Hx of MRSA infection (2018) Initially on IV antibiotics and transitioned to Keflex at d/c in 04/2021. Follows with Dr Vu/ ID and recent MRI showing improvement in septic arthritis and resolution of abscesses in joint. Plan for follow up with ID on 08/26/21. - Continue Keflex  - Follow up with ID   GERD - Continue Pepcid   Chronic cough Has hx of chronic cough with NBNB vomiting  - Tessalon prn   Pruritis   - Hydroxyzine prn    Best Practice: Diet: NPO IVF: None,None VTE: Eliquis Code: Full   Lajean Manes, MD  Internal Medicine Resident, PGY-1 [facility]  Pager: [phone] 10:42 PM, 08/18/2021

## 2021-08-18 NOTE — ED Provider Triage Note (Signed)
Emergency Medicine Provider Triage Evaluation Note  Phillip Young , a 36 y.o. male  was evaluated in triage.  Pt complains of shortness of breath. He has had cough worsening. He went to dialysis today and they were unable to get his morning to work and sent him here. He is normally on 1 L oxygen however recently he has been on 3 to 4 L of oxygen.  He reports chest pain, shortness of breath, and fluid overload in his chest and abdomen.   Physical Exam  BP (!) 143/129 (BP Location: Left Arm)    Pulse 96    Temp 98.4 F (36.9 C) (Oral)    Resp (!) 30    SpO2 92%  Gen:   Awake, appears unwell Resp:  Increased rate and effort, frequent nonproductive cough, rales bilaterally MSK:   Moves extremities without difficulty  Other:  Exam limited by coughing  Medical Decision Making  Medically screening exam initiated at 10:26 AM.  Appropriate orders placed.  Phillip Young was informed that the remainder of the evaluation will be completed by another provider, this initial triage assessment does not replace that evaluation, and the importance of remaining in the ED until their evaluation is complete.  Note: Portions of this report may have been transcribed using voice recognition software. Every effort was made to ensure accuracy; however, inadvertent computerized transcription errors may be present    Phillip Glass, PA-C 08/18/21 1043

## 2021-08-18 NOTE — ED Notes (Signed)
Provided pt with sandwich and cran-apple juice.

## 2021-08-18 NOTE — Progress Notes (Addendum)
ANTICOAGULATION CONSULT NOTE - Initial Consult  Pharmacy Consult for Enoxaparin Indication:  Holding eliquis anticipating IR procedure / DVT ppx  Allergies  Allergen Reactions   Coreg [Carvedilol] Shortness Of Breath and Diarrhea    Wheezing    Heparin Other (See Comments)    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, SRA positive    Patient Measurements: Height: 6' (182.9 cm) Weight: (!) 183.7 kg (405 lb) IBW/kg (Calculated) : 77.6  Vital Signs: Temp: 98.4 F (36.9 C) (01/24 1005) Temp Source: Oral (01/24 1005) BP: 127/110 (01/24 2000) Pulse Rate: 101 (01/24 2000)  Labs: Recent Labs    08/18/21 1132 08/18/21 1223 08/18/21 1407  HGB 9.6* 10.2*  --   HCT 29.7* 30.0*  --   PLT 282  --   --   CREATININE 9.91*  --   --   TROPONINIHS 26*  --  26*    Estimated Creatinine Clearance: 17.5 mL/min (A) (by C-G formula based on SCr of 9.91 mg/dL (H)).   Medical History: Past Medical History:  Diagnosis Date   Biventricular congestive heart failure ([place])    Last Echo 11/2019 at [facility] reveals EF 20%   Class 3 severe obesity due to excess calories with serious comorbidity and body mass index (BMI) of 50.0 to 59.9 in adult [facility]) 02/26/2020   Essential hypertension 02/26/2020   GERD without esophagitis 02/26/2020   Hidradenitis suppurativa 02/26/2020   OSA (obstructive sleep apnea) 02/26/2020   Prediabetes 02/26/2020   Renal disorder    Medications:  (Not in a hospital admission)  Scheduled:  Infusions:  PRN: acetaminophen **OR** acetaminophen  Assessment: 64 yom with a history of HTN, T2DM, paroxysmal fibrillation on Elqiuis, chronic HFrEF (EF 20%), OSA on CPAP, and ESRD on HD TTS . Patient is presenting from his dialysis center for a malfunctioning dialysis catheter; plan for IR catheter replacement and serial HD inpatient.  Decision made to hold eliquis anticipating IR procedure / DVT ppx by admitting team. Patient with HITab positive and SRA positive in 2022.  Patient  last received eliquis this morning (1/24am). Given HD status clearance of eliquis will be impaired.   Goal of Therapy:  Monitor platelets by anticoagulation protocol: Yes   Plan:  Discussed with admitting team, will plan to start enoxaparin 1/25 am pending recommendations from IR/nephrology regarding anticoagulation Monitor for s/s of bleeding Resume apixaban when appropriate  Lorelei Pont, PharmD, BCPS 08/18/2021 8:45 PM ED Clinical Pharmacist -  [phone]  Addendum: Ok to continue apixaban per IR - has been ordered by admitting team  Lorelei Pont, PharmD, BCPS 08/18/2021 10:54 PM ED Clinical Pharmacist -  [phone]

## 2021-08-18 NOTE — ED Notes (Signed)
Dialysis consent at bedside.

## 2021-08-18 NOTE — H&P (Incomplete Revision)
Date: 08/18/2021               Patient Name:  Phillip Young MRN: [id number]  DOB: Jul 27, 1985 Age / Sex: 36 y.o., male   PCP: [name], MD         Medical Service: Internal Medicine Teaching Service         Attending Physician: Charise Killian, MD    First Contact: Mitzie Na, MD Pager: [phone]  Second Contact: Gaylan Gerold, DO Pager: Tenny Craw       After Hours (After 5p/  First Contact Pager: [phone]  weekends / holidays): Second Contact Pager: [phone]    Chief Complaint: HD catheter malfunction    History of Present Illness:  Phillip Young is a 36 y.o. male with a pertinent PMH of HTN, T2DM, paroxysmal fibrillation on Elqiuis, chronic HFrEF (EF 20%), OSA on CPAP, and ESRD on HD TTS presenting to the ED from his dialysis center for a malfunctioning dialysis catheter.   He reports arriving to this outpatient HD center today 1/24, but nursing staff was unable to fully access his dialysis catheter, and thus was unable to obtain dialysis. This prompted him to come to the ED. He last received HD on 1/21 when admitted to [facility] from 1/19-1/21 for hypervolemia, and was treated with Lasix and HD.   Patient reports worsening SHOB, DOE, and nonproductive cough for the past 2 weeks, though these symptoms are present at baseline for years now. Requires 2L supplemental O2 at baseline but reports needing up to 4 liters recently 2/2 ongoing symptoms. Reports NBNB vomiting 2/2 cough x past couple of days. He denies fevers, chills, and urinary symptoms. Continues to produce a urine; reports going 4-5 times a day "a little at a time".   On arrival, the patient was afebrile, tachycardic in the low 100s, tachypneic to the 30s, hypertensive BP 143/129, saturating 92% on room air, and subsequently placed on 2 L O2 via nasal cannula. Initial ED workup with BNP elevated to 653, CBC without leukocytosis, stable anemia of 9.6, VBG without an acidosis, troponin mildly elevated to 26 -> 26, and CMP  with K of 3.9. CTA showing pulmonary edema.   Nephrology consulted in the ED; plan for IR catheter replacement and serial HD inpatient.   Meds:  No current facility-administered medications on file prior to encounter.   Current Outpatient Medications on File Prior to Encounter  Medication Sig Dispense Refill   amiodarone (PACERONE) 200 MG tablet Take 1 tablet (200 mg total) by mouth daily. 30 tablet 0   amitriptyline (ELAVIL) 10 MG tablet Take 1 tablet (10 mg total) by mouth at bedtime. 30 tablet 0   apixaban (ELIQUIS) 5 MG TABS tablet Take 1 tablet (5 mg total) by mouth 2 (two) times daily. 60 tablet 11   cephALEXin (KEFLEX) 500 MG capsule Take 1 capsule (500 mg total) by mouth daily. Take 1 at bedtime every day. 30 capsule 2   cyclobenzaprine (FLEXERIL) 5 MG tablet Take 1 tablet (5 mg total) by mouth 3 (three) times daily as needed for muscle spasms. 30 tablet 0   famotidine (PEPCID) 20 MG tablet Take 1 tablet (20 mg total) by mouth 2 (two) times daily. (Patient taking differently: Take 20 mg by mouth 2 (two) times daily as needed for heartburn or indigestion.) 60 tablet 0   lanthanum (FOSRENOL) 500 MG chewable tablet Chew 500 mg by mouth 3 (three) times daily with meals.     midodrine (PROAMATINE) 10 MG tablet Take  2 tablets (20 mg total) by mouth 3 (three) times daily with meals. 90 tablet 0   multivitamin (RENA-VIT) TABS tablet Take 1 tablet by mouth at bedtime. 30 tablet 0   ONDANSETRON HCL PO Take 1 tablet by mouth every 8 (eight) hours as needed (nausea or vomiting.).     sodium chloride (OCEAN) 0.65 % SOLN nasal spray Place 1 spray into both nostrils as needed for congestion.  0   ENTRESTO 97-103 MG Take 1 tablet by mouth 2 (two) times daily.     hydrOXYzine (ATARAX) 25 MG tablet Take 25 mg by mouth 2 (two) times daily. (Patient not taking: Reported on 08/18/2021)     SOAANZ 40 MG TABS Take 80 mg by mouth daily. (Patient not taking: Reported on 08/18/2021)     Allergies: Allergies as  of 08/18/2021 - Review Complete 08/18/2021  Allergen Reaction Noted   Coreg [carvedilol] Shortness Of Breath and Diarrhea 07/07/2020   Heparin Other (See Comments) 03/05/2021   Past Medical History:  Diagnosis Date   Biventricular congestive heart failure ([place])    Last Echo 11/2019 at [facility] reveals EF 20%   Class 3 severe obesity due to excess calories with serious comorbidity and body mass index (BMI) of 50.0 to 59.9 in adult [facility]) 02/26/2020   Essential hypertension 02/26/2020   GERD without esophagitis 02/26/2020   Hidradenitis suppurativa 02/26/2020   OSA (obstructive sleep apnea) 02/26/2020   Prediabetes 02/26/2020   Renal disorder    Family History  Problem Relation Age of Onset   Heart disease Mother    Hypertension Mother    Pulmonary Hypertension Mother    Drug abuse Father        died due to Heroin overdose    Social History:   Lives with mother and uncle  Employment- drives a school bus   Denies tobacco, alcohol, and illicit drug use Per chart review: Quit smoking in Oct 2019, used to smoke about 6 cigars per day for about 2 years. IADLs/ADLs- can person independently at baseline   Review of Systems: A complete ROS was negative except as per HPI.    Physical Exam: Blood pressure (!) 127/110, pulse (!) 101, temperature 98.4 F (36.9 C), temperature source Oral, resp. rate 16, height 6' (1.829 m), weight (!) 183.7 kg, SpO2 97 %.  Constitutional: alert, ill-appearing, and in moderate distress  Neck: difficult to assess JVD d/t body habitus  Cardiovascular: regular rhythm, tachycardic, no m/r/g, 1+ bilateral LE pitting edema  Pulmonary/Chest: increased work of breathing on 4L [place], bilateral crackles in lower lung fields.  Abdominal: soft, non-tender to palpation, distended Neurological: A&O x 3, follows commands  Skin: cool and dry  EKG: ST  CXR: bilateral pulmonary edema   Assessment & Plan by Problem: Principal Problem:   Volume overload   Phillip Young is a 36 y.o. male with a pertinent PMH of HTN, T2DM, paroxysmal fibrillation on Elqiuis, chronic HFrEF (EF 20%), OSA on CPAP, and ESRD on HD TTS admitted for HD cath replacement.    Volume overload 2/2 missed HD session Malfunctioning HD catheter  ESRD on HD TTS  Chronic HFrEF (EF 20%) Recent admission in Sept 2022 for cardiogenic shock and cardiorenal syndrome with progression to ESRD. Follows closely with the Heart Failure team. Last ECHO from 04/2021 with LVEF 20% with global hypokinesis. Unclear if taking Entresto (recently refilled), and takes Midodrine 20 tid. Missed HD in setting of right IJ catheter malfunction and is now fluid overloaded but difficult  to clinically appreciate on exam. 12 kg above his dry weight and CTA with pulmonary edema but no PE. No electrolyte derangements. Nephrology on board, with plans for serial HD sessions after IR guided HD catheter exchange possibly tomorrow. No permanent access yet as pt did not follow up with vascular surgery as an outpatient, plan for VVS consult tmrw for fistula, per nephrology.  - Right IR catheter replacement per IR   - Serial HD per nephrology  - VVS consult in AM for fistula  - Fluid restriction  - Strict I/O's and daily weights  - Started Midodrine 10 mg tid (takes 20 tid at home) - Renal panel   PAF In SR currently. Rate in 100's. Takes Amiodarone and Eliquis. - Continue Amiodarone  - Continue Eliquis     DM2 Last A1c 6.5 (02/11/21). Not on any diabetes medications.  - SSI - CBG monitoring  - A1c  Hx of HTN Normotensive on admission. Not on any antihypertensive agents.    Anemia of chronic diease  Chronic and stable, Hgb 9.6. On ESA.    OSA on CPAP - Continue CPAP   Class 3 obesity  - Encourage weight loss   MSSA septic arthritis of left sacroiliac joint and sacral osteomyelitis (03/2021) Hx of MRSA infection (2018) Initially on IV antibiotics and transitioned to Keflex at d/c in 04/2021. Follows with Dr  Vu/ ID and recent MRI showing improvement in septic arthritis and resolution of abscesses in joint. Plan for follow up with ID on 08/26/21. - Continue Keflex  - Follow up with ID   GERD - Continue Pepcid   Chronic cough Has hx of chronic cough with NBNB vomiting. Unclear if this related to underlying GERD.  - Tessalon prn   Pruritis   - Hydroxyzine prn    Best Practice: Diet: NPO IVF: None,None VTE: Eliquis Code: Full   Lajean Manes, MD  Internal Medicine Resident, PGY-1 [facility]  Pager: [phone] 10:42 PM, 08/18/2021

## 2021-08-18 NOTE — ED Notes (Signed)
Pt provided apple juice and sandwich.

## 2021-08-18 NOTE — ED Notes (Signed)
Pt c/o having too much fluid on him. Pt states that he is very itchy at this time.

## 2021-08-18 NOTE — ED Provider Notes (Signed)
[facility] EMERGENCY DEPARTMENT Provider Note   CSN: [id number] Arrival date & time: 08/18/21  0957     History  Chief Complaint  Patient presents with   fluid overload   Cough    Phillip Young is a 36 y.o. male.   Cough Associated symptoms: shortness of breath    36 year old male with a history of severe biventricular systolic heart failure with known ejection fraction less than 20%, diabetes type 2, paroxysmal a flutter on Eliquis, now on HD TTS since July 2022 who presents from his dialysis center for a malfunctioning dialysis catheter.  He endorses worsening shortness of breath.  He had a recent admission at [facility] for hypervolemia and fluid overload and had fluid taken off over the course of a 2-day hospital admission.  He still endorses a nonproductive cough and shortness of breath.  His dialysis center was unable to fully access his dialysis catheter and he was unable to obtain dialysis today.  He presents to the emergency department for further evaluation.  He endorses a cough with dyspnea on exertion.  He endorses some itching.  He feels fluid overloaded.  He has difficulty lying flat.  He denies any active chest pain.  He denies any fevers or chills.  Home Medications Prior to Admission medications   Medication Sig Start Date End Date Taking? Authorizing Provider  amiodarone (PACERONE) 200 MG tablet Take 1 tablet (200 mg total) by mouth daily. 05/19/21  Yes Samella Parr, NP  amitriptyline (ELAVIL) 10 MG tablet Take 1 tablet (10 mg total) by mouth at bedtime. 05/19/21  Yes Samella Parr, NP  apixaban (ELIQUIS) 5 MG TABS tablet Take 1 tablet (5 mg total) by mouth 2 (two) times daily. 03/26/21  Yes Carollo, Farley Ly, NP  cephALEXin (KEFLEX) 500 MG capsule Take 1 capsule (500 mg total) by mouth daily. Take 1 at bedtime every day. 08/04/21 11/02/21 Yes Vu, Rockey Situ, MD  cyclobenzaprine (FLEXERIL) 5 MG tablet Take 1 tablet (5 mg total) by mouth 3 (three)  times daily as needed for muscle spasms. 05/19/21  Yes Samella Parr, NP  famotidine (PEPCID) 20 MG tablet Take 1 tablet (20 mg total) by mouth 2 (two) times daily. Patient taking differently: Take 20 mg by mouth 2 (two) times daily as needed for heartburn or indigestion. 05/19/21  Yes Samella Parr, NP  lanthanum (FOSRENOL) 500 MG chewable tablet Chew 500 mg by mouth 3 (three) times daily with meals.   Yes [provider]  midodrine (PROAMATINE) 10 MG tablet Take 2 tablets (20 mg total) by mouth 3 (three) times daily with meals. 05/19/21  Yes Samella Parr, NP  multivitamin (RENA-VIT) TABS tablet Take 1 tablet by mouth at bedtime. 05/19/21  Yes Samella Parr, NP  ONDANSETRON HCL PO Take 1 tablet by mouth every 8 (eight) hours as needed (nausea or vomiting.).   Yes [provider]  sodium chloride (OCEAN) 0.65 % SOLN nasal spray Place 1 spray into both nostrils as needed for congestion. 05/14/21  Yes Samella Parr, NP  ENTRESTO 97-103 MG Take 1 tablet by mouth 2 (two) times daily. 08/14/21   [provider]  hydrOXYzine (ATARAX) 25 MG tablet Take 25 mg by mouth 2 (two) times daily. Patient not taking: Reported on 08/18/2021 08/17/21   [provider]  SOAANZ 40 MG TABS Take 80 mg by mouth daily. Patient not taking: Reported on 08/18/2021 04/07/21   [provider]  Allergies    Coreg [carvedilol] and Heparin    Review of Systems   Review of Systems  Respiratory:  Positive for cough and shortness of breath.   All other systems reviewed and are negative.  Physical Exam Updated Vital Signs BP 98/74    Pulse 100    Temp 98.4 F (36.9 C) (Oral)    Resp (!) 22    Ht 6' (1.829 m)    Wt (!) 183.7 kg    SpO2 99%    BMI 54.93 kg/m  Physical Exam Vitals and nursing note reviewed.  Constitutional:      General: He is not in acute distress.    Appearance: He is obese.     Comments: Appears uncomfortable, leaning forward  HENT:     Head:  Normocephalic and atraumatic.  Eyes:     Conjunctiva/sclera: Conjunctivae normal.     Pupils: Pupils are equal, round, and reactive to light.  Cardiovascular:     Rate and Rhythm: Regular rhythm. Tachycardia present.     Pulses: Normal pulses.  Pulmonary:     Effort: Tachypnea present.     Breath sounds: Decreased breath sounds and rales present.  Chest:     Comments: Right chest wall dialysis catheter in place Abdominal:     General: There is no distension.     Tenderness: There is no guarding.  Musculoskeletal:        General: No deformity or signs of injury.     Cervical back: Neck supple.  Skin:    Findings: No lesion or rash.  Neurological:     General: No focal deficit present.     Mental Status: He is alert. Mental status is at baseline.    ED Results / Procedures / Treatments   Labs (all labs ordered are listed, but only abnormal results are displayed) Labs Reviewed  COMPREHENSIVE METABOLIC PANEL - Abnormal; Notable for the following components:      Result Value   CO2 21 (*)    BUN 53 (*)    Creatinine, Ser 9.91 (*)    AST 72 (*)    ALT 147 (*)    Total Bilirubin 1.8 (*)    GFR, Estimated 6 (*)    All other components within normal limits  CBC WITH DIFFERENTIAL/PLATELET - Abnormal; Notable for the following components:   RBC 2.99 (*)    Hemoglobin 9.6 (*)    HCT 29.7 (*)    RDW 19.4 (*)    All other components within normal limits  BRAIN NATRIURETIC PEPTIDE - Abnormal; Notable for the following components:   B Natriuretic Peptide 653.3 (*)    All other components within normal limits  I-STAT VENOUS BLOOD GAS, ED - Abnormal; Notable for the following components:   pCO2, Ven 40.3 (*)    pO2, Ven 66.0 (*)    HCT 30.0 (*)    Hemoglobin 10.2 (*)    All other components within normal limits  TROPONIN I (HIGH SENSITIVITY) - Abnormal; Notable for the following components:   Troponin I (High Sensitivity) 26 (*)    All other components within normal limits   TROPONIN I (HIGH SENSITIVITY) - Abnormal; Notable for the following components:   Troponin I (High Sensitivity) 26 (*)    All other components within normal limits  RESP PANEL BY RT-PCR (FLU A&B, COVID) ARPGX2  LIPASE, BLOOD    EKG EKG Interpretation  Date/Time:  Tuesday August 18 2021 09:55:26 EST Ventricular Rate:  105 PR Interval:  180 QRS Duration: 102 QT Interval:  264 QTC Calculation: 348 R Axis:   93 Text Interpretation: Sinus tachycardia Rightward axis Nonspecific ST and T wave abnormality Abnormal ECG When compared with ECG of 15-May-2021 06:57, PREVIOUS ECG IS PRESENT Confirmed by Regan Lemming ([id number]) on 08/18/2021 8:00:22 PM  Radiology DG Chest 2 View  Result Date: 08/18/2021 CLINICAL DATA:  36 year old male with history of shortness of breath and cough. EXAM: CHEST - 2 VIEW COMPARISON:  Chest x-ray 08/13/2021. FINDINGS: Right internal jugular PermCath with tip terminating in the distal superior vena cava. Lung volumes are slightly low. No acute consolidative airspace disease. No pleural effusions. No pneumothorax. Cephalization of the pulmonary vasculature, without frank pulmonary edema. Moderate cardiomegaly. Upper mediastinal contours are within normal limits. IMPRESSION: 1. Moderate cardiomegaly with pulmonary venous congestion, but no frank pulmonary edema. Electronically Signed   By: Vinnie Langton M.D.   On: 08/18/2021 10:58   CT Angio Chest PE W and/or Wo Contrast  Result Date: 08/18/2021 CLINICAL DATA:  Shortness of breath EXAM: CT ANGIOGRAPHY CHEST WITH CONTRAST TECHNIQUE: Multidetector CT imaging of the chest was performed using the standard protocol during bolus administration of intravenous contrast. Multiplanar CT image reconstructions and MIPs were obtained to evaluate the vascular anatomy. RADIATION DOSE REDUCTION: This exam was performed according to the departmental dose-optimization program which includes automated exposure control, adjustment of the mA  and/or kV according to patient size and/or use of iterative reconstruction technique. CONTRAST:  169mL OMNIPAQUE IOHEXOL 350 MG/ML SOLN COMPARISON:  CT chest dated October 09, 2020 FINDINGS: Cardiovascular: Adequate contrast opacification of the pulmonary arteries. Limited evaluation of the proximal segmental and subsegmental pulmonary arteries due to patient body habitus cardiomegaly. Small pericardial effusion. No significant coronary artery calcifications or atherosclerotic disease of the thoracic aorta. Right central venous line with tip near the superior cavoatrial junction. Mediastinum/Nodes: Enlarged mediastinal and hilar lymph nodes, unchanged when compared with prior CT. Reference periaortic lymph node located on series 3, image 99 measuring 1.8 cm in short axis. Lungs/Pleura: Central airways are patent. Mild bilateral air trapping. Diffuse bilateral ground-glass opacities. No pleural effusion or pneumothorax. Upper Abdomen: Reflux of contrast is noted in the hepatic veins which can be seen in the setting of right heart dysfunction. Musculoskeletal: No chest wall abnormality. No acute or significant osseous findings. Review of the MIP images confirms the above findings. IMPRESSION: 1. Cardiomegaly and small pericardial effusion. 2. Diffuse bilateral ground-glass opacities, likely due to pulmonary edema. 3. Bilateral air trapping, findings can be seen in the setting small airways disease. Electronically Signed   By: Yetta Glassman M.D.   On: 08/18/2021 14:29    Procedures Procedures    Medications Ordered in ED Medications  iohexol (OMNIPAQUE) 350 MG/ML injection 100 mL (100 mLs Intravenous Contrast Given 08/18/21 1407)    ED Course/ Medical Decision Making/ A&P                           Medical Decision Making Amount and/or Complexity of Data Reviewed Radiology: ordered.  Risk Prescription drug management. Decision regarding hospitalization.    36 year old male with a history of  severe biventricular systolic heart failure with known ejection fraction less than 20%, diabetes type 2, paroxysmal a flutter on Eliquis, now on HD TTS since July 2022 who presents from his dialysis center for a malfunctioning dialysis catheter.  He endorses worsening shortness of breath.  He had a recent admission at [facility] for hypervolemia and  fluid overload and had fluid taken off over the course of a 2-day hospital admission.  He still endorses a nonproductive cough and shortness of breath.  His dialysis center was unable to fully access his dialysis catheter and he was unable to obtain dialysis today.  He presents to the emergency department for further evaluation.  He endorses a cough with dyspnea on exertion.  He endorses some itching.  He feels fluid overloaded.  He has difficulty lying flat.  He denies any active chest pain.  He denies any fevers or chills  On arrival, the patient was afebrile, tachycardic in the low 100s, sinus tachycardia noted on cardiac telemetry, tachypneic to the 30s, hypertensive BP 143/129, saturating 92% on room air, subsequently placed on 2 L O2 via nasal cannula.  Patient is morbidly obese on exam, with decreased breath sounds appreciated with some rales heard.  He was tachycardic and appeared in mild to moderate respiratory distress.  Concern for hypervolemia in the setting of missed dialysis session.  Additional concern for possible PE given the patient's cough and shortness of breath.  Additionally considered CHF exacerbation given the patient's known medical history and hypervolemia. Patient denies any chest pain to suggest ACS at this time.    Work-up initiated to include an EKG which revealed sinus tachycardia, chest x-ray reviewed by myself and radiology revealed cardiomegaly and evidence of pulmonary vascular congestion suggestive of volume overload.  A CTA PE study was ordered and pending.  A BNP was elevated moderately to 653, CBC was without a  leukocytosis, hemoglobin with anemia to 9.6, COVID-19 and influenza PCR testing negative, lipase normal, VBG without an acidosis, PCO2 40 likely due to tachypnea, HCO3 23, troponin mildly elevated to 26, CMP with a BUN of 53 and a creatinine of 9.91, potassium of 3.9.  Given the patient's failed dialysis access, nephrology was consulted for further recommendations.  Plan for likely IR consultation for access catheter replacement and plan for likely dialysis inpatient.  Plan at time of signout to follow-up the patient CTA PE imaging to evaluate for any possible PE, consider consultation to cardiology given the patient's known heart failure and hypervolemia, plan for likely medical admission.  Signout given to Dr. Dina Rich at 1600.    Final Clinical Impression(s) / ED Diagnoses Final diagnoses:  Complication of dialysis access insertion, initial encounter  Hypervolemia, unspecified hypervolemia type  Acute pulmonary edema [facility])    Rx / DC Orders ED Discharge Orders     None         Regan Lemming, MD 08/18/21 2003

## 2021-08-18 NOTE — ED Triage Notes (Signed)
Pt. Stated, I went to dialysis today and they couldn't pull the blood. Im fluid over loaded. Im N/V  and Im unable to walk. Symptoms started 2 weeks ago. I went to [facility] keep me for 2 days and took off fluid but was not enough.

## 2021-08-18 NOTE — ED Provider Notes (Signed)
Patient signed out to me by previous provider. Please refer to their note for full HPI.  Briefly this is a 36 year old male who presents emergency department with missed dialysis due to a unusable right IJ catheter.  Appears to be slightly fluid overloaded requiring 2 L nasal cannula, due to hypoxia and tachycardia pending a CT PE study.  Nephrology is aware. Physical Exam  BP (!) 127/110    Pulse (!) 101    Temp 98.4 F (36.9 C) (Oral)    Resp 16    Ht 6' (1.829 m)    Wt (!) 183.7 kg    SpO2 97%    BMI 54.93 kg/m   Physical Exam Vitals and nursing note reviewed.  Constitutional:      General: He is not in acute distress.    Appearance: Normal appearance. He is not diaphoretic.  HENT:     Head: Normocephalic.     Mouth/Throat:     Mouth: Mucous membranes are moist.  Cardiovascular:     Rate and Rhythm: Normal rate.  Pulmonary:     Effort: Pulmonary effort is normal. No respiratory distress.     Comments: 2L [place] Skin:    General: Skin is warm.  Neurological:     Mental Status: He is alert and oriented to person, place, and time. Mental status is at baseline.  Psychiatric:        Mood and Affect: Mood normal.    Procedures  Procedures  ED Course / MDM    Medical Decision Making Amount and/or Complexity of Data Reviewed Radiology: ordered.  Risk Prescription drug management. Decision regarding hospitalization.   CT PE study is negative but confirms pulmonary edema.  Patient will be admitted from a medical standpoint, nephrology is already consulted and place IR order for tomorrow and catheter evaluation.  They will perform dialysis after this.  Patients evaluation and results requires admission for further treatment and care.  Spoke with IM doctore, reviewed patient's ED course and they accept admission.  Patient agrees with admission plan, offers no new complaints and is stable/unchanged at time of admit.       Lorelle Gibbs, DO 08/18/21 2026

## 2021-08-18 NOTE — Hospital Course (Addendum)
ESRD on HD TTS  Malfunctioning HD catheter  In brief patient was admitted to [facility] from 1/19-1/21 for hypervolemia was treated with diuresis and dialysis.  Patient arrived to regularly scheduled outpatient dialysis center 1/24 but staff was unable to access his dialysis catheter.  Patient noting worsening shortness of breath requiring up to 4 L from baseline of 2 L over the last several days.  Patient was afebrile tachycardic in the low 100s tachypneic and mildly hypertensive on presentation.  He was saturating 92% on room air but placed on 2 L nasal cannula.  BNP elevated at 653, stable anemia, normal potassium.  Troponin 26x2.  AST of 72, ALT 147 which appears new from our labs 3 months ago, however downtrending from recent hospitalization 1/21.  CTA showing pulmonary edema. The following day, patient was seen on BiPAP with hypervolemia on exam.  He was 12 kg above his dry weight. Nephrology was consulted with plan for IR evaluation of access catheter in right IJ with subsequent dialysis.***   Chronic HFrEF Nonischemic cardiomyopathy Patient history of HFrEF status post cardiogenic shock in September 2022.  Echocardiogram in October 2022 show EF of 20%, nonischemic in nature.  Could not get cardiac MRI due to body habitus.  Patient is not a candidate for ICD either given BMI.  Patient was continued on midodrine 20 mg 3 times daily, and volume managed controlled per nephrology with HD.   Paroxysmal Afib Patient was continued on amiodarone and Eliquis***   Elevated LFT He has similar presentation during his admission on 1/19.  Hepatitis panel was negative at that time.  It was thought due to congestive liver.   On admit here, AST of 72 and ALT of 147.  We obtained a right upper quadrant ultrasound which showed ***.  The following day,***   Anemia of chronic disease Managed with ESA throughout his hospitalization.  MSSA septic arthritis of left sacroiliac joint and sacral osteomyelitis  (03/2021) Hx of MRSA infection (2018) Initially on IV antibiotics and transitioned to Keflex at d/c in 04/2021. Follows with Dr Vu/ ID and recent MRI showing improvement in septic arthritis and resolution of abscesses in joint. Plan for follow up with ID on 08/26/21.  Otherwise, continued his home Keflex during this hospitalization.   OSA Wore CPAP at night throughout his hospitalization.   Type 2 diabetes A1c of ***

## 2021-08-19 ENCOUNTER — Observation Stay (HOSPITAL_COMMUNITY): Payer: BC Managed Care – PPO

## 2021-08-19 ENCOUNTER — Observation Stay (HOSPITAL_BASED_OUTPATIENT_CLINIC_OR_DEPARTMENT_OTHER): Payer: BC Managed Care – PPO

## 2021-08-19 DIAGNOSIS — I480 Paroxysmal atrial fibrillation: Secondary | ICD-10-CM

## 2021-08-19 DIAGNOSIS — E1122 Type 2 diabetes mellitus with diabetic chronic kidney disease: Secondary | ICD-10-CM | POA: Diagnosis not present

## 2021-08-19 DIAGNOSIS — I255 Ischemic cardiomyopathy: Secondary | ICD-10-CM

## 2021-08-19 DIAGNOSIS — Z992 Dependence on renal dialysis: Secondary | ICD-10-CM | POA: Diagnosis not present

## 2021-08-19 DIAGNOSIS — B9561 Methicillin susceptible Staphylococcus aureus infection as the cause of diseases classified elsewhere: Secondary | ICD-10-CM

## 2021-08-19 DIAGNOSIS — R7989 Other specified abnormal findings of blood chemistry: Secondary | ICD-10-CM

## 2021-08-19 DIAGNOSIS — G4733 Obstructive sleep apnea (adult) (pediatric): Secondary | ICD-10-CM

## 2021-08-19 DIAGNOSIS — I5022 Chronic systolic (congestive) heart failure: Secondary | ICD-10-CM

## 2021-08-19 DIAGNOSIS — M0088 Arthritis due to other bacteria, vertebrae: Secondary | ICD-10-CM

## 2021-08-19 DIAGNOSIS — N186 End stage renal disease: Secondary | ICD-10-CM

## 2021-08-19 DIAGNOSIS — T8249XA Other complication of vascular dialysis catheter, initial encounter: Secondary | ICD-10-CM | POA: Diagnosis not present

## 2021-08-19 DIAGNOSIS — D638 Anemia in other chronic diseases classified elsewhere: Secondary | ICD-10-CM

## 2021-08-19 LAB — RENAL FUNCTION PANEL
Albumin: 3.3 g/dL — ABNORMAL LOW (ref 3.5–5.0)
Anion gap: 16 — ABNORMAL HIGH (ref 5–15)
BUN: 62 mg/dL — ABNORMAL HIGH (ref 6–20)
CO2: 21 mmol/L — ABNORMAL LOW (ref 22–32)
Calcium: 9.4 mg/dL (ref 8.9–10.3)
Chloride: 96 mmol/L — ABNORMAL LOW (ref 98–111)
Creatinine, Ser: 10.36 mg/dL — ABNORMAL HIGH (ref 0.61–1.24)
GFR, Estimated: 6 mL/min — ABNORMAL LOW (ref >60)
Glucose, Bld: 94 mg/dL (ref 70–99)
Phosphorus: 5.7 mg/dL — ABNORMAL HIGH (ref 2.5–4.6)
Potassium: 4 mmol/L (ref 3.5–5.1)
Sodium: 133 mmol/L — ABNORMAL LOW (ref 135–145)

## 2021-08-19 LAB — CBC
HCT: 27.4 % — ABNORMAL LOW (ref 39.0–52.0)
Hemoglobin: 9 g/dL — ABNORMAL LOW (ref 13.0–17.0)
MCH: 32.3 pg (ref 26.0–34.0)
MCHC: 32.8 g/dL (ref 30.0–36.0)
MCV: 98.2 fL (ref 80.0–100.0)
Platelets: 251 10*3/uL (ref 150–400)
RBC: 2.79 MIL/uL — ABNORMAL LOW (ref 4.22–5.81)
RDW: 19 % — ABNORMAL HIGH (ref 11.5–15.5)
WBC: 8 10*3/uL (ref 4.0–10.5)
nRBC: 0 % (ref 0.0–0.2)

## 2021-08-19 LAB — HEMOGLOBIN A1C
Hgb A1c MFr Bld: 5.9 % — ABNORMAL HIGH (ref 4.8–5.6)
Mean Plasma Glucose: 123 mg/dL

## 2021-08-19 LAB — APTT: aPTT: 33 seconds (ref 24–36)

## 2021-08-19 LAB — PROTIME-INR
INR: 1.5 — ABNORMAL HIGH (ref 0.8–1.2)
Prothrombin Time: 18.5 seconds — ABNORMAL HIGH (ref 11.4–15.2)

## 2021-08-19 LAB — HEPATITIS B SURFACE ANTIBODY,QUALITATIVE: Hep B S Ab: REACTIVE — AB

## 2021-08-19 LAB — HEPARIN LEVEL (UNFRACTIONATED): Heparin Unfractionated: >1.1 IU/mL — ABNORMAL HIGH (ref 0.30–0.70)

## 2021-08-19 LAB — HEPATITIS B SURFACE ANTIGEN: Hepatitis B Surface Ag: NONREACTIVE

## 2021-08-19 MED ORDER — SODIUM CHLORIDE 0.9 % IV SOLN: 100.0000 mL | INTRAVENOUS | Status: DC | PRN

## 2021-08-19 MED ORDER — PENTAFLUOROPROP-TETRAFLUOROETH EX AERO: 1.0000 "application " | INHALATION_SPRAY | CUTANEOUS | Status: DC | PRN

## 2021-08-19 MED ORDER — CHLORHEXIDINE GLUCONATE CLOTH 2 % EX PADS
6.0000 | MEDICATED_PAD | Freq: Every day | CUTANEOUS | Status: DC
  Administered 2021-08-19 – 2021-08-20 (×2): 6 via TOPICAL

## 2021-08-19 MED ORDER — LIDOCAINE HCL (PF) 1 % IJ SOLN
5.0000 mL | INTRAMUSCULAR | Status: DC | PRN
Start: 2021-08-20 — End: 2021-08-21

## 2021-08-19 MED ORDER — ALTEPLASE 2 MG IJ SOLR: 2.0000 mg | Freq: Once | INTRAMUSCULAR | Status: DC | PRN

## 2021-08-19 MED ORDER — LIDOCAINE-PRILOCAINE 2.5-2.5 % EX CREA: 1.0000 "application " | TOPICAL_CREAM | CUTANEOUS | Status: DC | PRN

## 2021-08-19 MED ORDER — METHOCARBAMOL 500 MG PO TABS
500.0000 mg | ORAL_TABLET | Freq: Once | ORAL | Status: AC
  Administered 2021-08-19: 500 mg via ORAL
  Filled 2021-08-19: qty 1

## 2021-08-19 MED ORDER — MIDODRINE HCL 5 MG PO TABS
20.0000 mg | ORAL_TABLET | Freq: Three times a day (TID) | ORAL | Status: DC
  Administered 2021-08-19 – 2021-08-22 (×6): 20 mg via ORAL
  Filled 2021-08-19 (×6): qty 4

## 2021-08-19 NOTE — Progress Notes (Signed)
[facility] Progress Note   Subjective:  Seen in room. On BiPAP. Hungry, getting frustrated with waiting.  IR to see for catheter exchange today. Plan for dialysis after.   Objective Vitals:   08/19/21 0156 08/19/21 0323 08/19/21 0340 08/19/21 0916  BP: 107/71 106/78 106/78 112/72  Pulse: (!) 101 100 98 99  Resp: 20 (!) 24 (!) 25 (!) 21  Temp: 98 F (36.7 C) 98.3 F (36.8 C)  98.7 F (37.1 C)  TempSrc: Oral     SpO2: 100% 96% 98% 100%  Weight:      Height:         Additional Objective Labs: Basic Metabolic Panel: Recent Labs  Lab 08/18/21 1132 08/18/21 1223 08/19/21 0540  NA 135 136 133*  K 3.9 3.8 4.0  CL 99  --  96*  CO2 21*  --  21*  GLUCOSE 94  --  94  BUN 53*  --  62*  CREATININE 9.91*  --  10.36*  CALCIUM 9.5  --  9.4  PHOS  --   --  5.7*   CBC: Recent Labs  Lab 08/18/21 1132 08/18/21 1223 08/19/21 0540  WBC 8.7  --  8.0  NEUTROABS 5.9  --   --   HGB 9.6* 10.2* 9.0*  HCT 29.7* 30.0* 27.4*  MCV 99.3  --  98.2  PLT 282  --  251   Blood Culture    Component Value Date/Time   SDES BLOOD LEFT HAND 04/19/2021 1137   SPECREQUEST  04/19/2021 1137    BOTTLES DRAWN AEROBIC AND ANAEROBIC Blood Culture adequate volume   CULT  04/19/2021 1137    NO GROWTH 5 DAYS Performed at [facility], [place] [address], [place], [place] [address]    REPTSTATUS 04/24/2021 FINAL 04/19/2021 1137     Physical Exam General: Large man, laying in bed, nad  Heart: RRR No m,r,g Lungs: Distant, no wheeze, rales  Abdomen: obese, soft, non-tender  Extremities: Trace LE edema  Dialysis Access: R IJ TDC   Medications:  [START ON 08/20/2021] sodium chloride     [START ON 08/20/2021] sodium chloride      amiodarone  200 mg Oral Daily   amitriptyline  10 mg Oral QHS   apixaban  5 mg Oral BID   benzonatate  100 mg Oral BID   cephALEXin  500 mg Oral Daily   Chlorhexidine Gluconate Cloth  6 each Topical Daily   famotidine  20 mg Oral BID   lanthanum   500 mg Oral TID WC   midodrine  20 mg Oral TID WC    Dialysis Orders:  Unit: [facility]  Time: 4:00 EDW: 175 kg  Flows: 425  Bath: 4K/2.25 Ca Access: TDC  Heparin: None (contraindication) ESA: Mircera 30 q 2 weeks (last 1/12)  VDRA: Hectorol 2 TIW   Assessment/Plan: ESRD -  Presented with malfunctioning dialysis catheter. Unable to dialyze at his center.  Have consulted IR for cathter exchange today--then dialysis. Also needs fistula. Has missed outpatient appointments with VVS. He's agreeable for AVF now -Have consulted [facility] for perm access  Volume overload - Missed dialysis and was 12 kg over dry weight on 1/24. Plan max UF with HD. Plan for serial treatments.  Hypertension - BP acceptable. Midodrine for BP support on HD  Anemia  - Hgb 9.6 On ESA. Next due 1/26.  Metabolic bone disease -  Calcium ok. Phos 5.7. Continue home binders, Hectrolol.   AFib- on Eliquis  OSA -  continue CPAP  HFrEF - Try to optimize volume with dialysis   Lynnda Child PA-C [facility] 08/19/2021,11:14 AM

## 2021-08-19 NOTE — Progress Notes (Signed)
Pt set on on BIPAP for night rest auto titrate with 5 lpm bled in.

## 2021-08-19 NOTE — H&P (Signed)
Date: 08/19/2021               Patient Name:  Phillip Young MRN: [id number]  DOB: 09/10/1985 Age / Sex: 36 y.o., male   PCP: [name], MD         Medical Service: Internal Medicine Teaching Service         Attending Physician: Charise Killian, MD    First Contact: Mitzie Na, MD Pager: [phone]  Second Contact: Gaylan Gerold, DO Pager: Tenny Craw       After Hours (After 5p/  First Contact Pager: [phone]  weekends / holidays): Second Contact Pager: [phone]    Chief Complaint: HD catheter malfunction      History of Present Illness:  Phillip Young is a 36 y.o. male with a pertinent PMH of HTN, T2DM, paroxysmal fibrillation on Elqiuis, chronic HFrEF (EF 20%), OSA on CPAP, and ESRD on HD TTS presenting to the ED from his dialysis center for a malfunctioning dialysis catheter.    He reports arriving to this outpatient HD center today 1/24, but nursing staff was unable to fully access his dialysis catheter, and thus was unable to obtain dialysis. This prompted him to come to the ED. He last received HD on 1/21 when admitted to [facility] from 1/19-1/21 for hypervolemia, and was treated with Lasix and HD.    Patient reports worsening SHOB, DOE, and nonproductive cough for the past 2 weeks, though these symptoms are present at baseline for years now. Requires 2L supplemental O2 at baseline but reports needing up to 4 liters recently 2/2 ongoing symptoms. Reports NBNB vomiting 2/2 cough x past couple of days. He denies fevers, chills, and urinary symptoms. Continues to produce a urine; reports going 4-5 times a day "a little at a time".    On arrival, the patient was afebrile, tachycardic in the low 100s, tachypneic to the 30s, hypertensive BP 143/129, saturating 92% on room air, and subsequently placed on 2 L O2 via nasal cannula. Initial ED workup with BNP elevated to 653, CBC without leukocytosis, stable anemia of 9.6, VBG without an acidosis, troponin mildly elevated to 26 -> 26, and  CMP with K of 3.9. CTA showing pulmonary edema.    Nephrology consulted in the ED; plan for IR catheter replacement and serial HD inpatient.   Meds:  No current facility-administered medications on file prior to encounter.   Current Outpatient Medications on File Prior to Encounter  Medication Sig Dispense Refill   amiodarone (PACERONE) 200 MG tablet Take 1 tablet (200 mg total) by mouth daily. 30 tablet 0   amitriptyline (ELAVIL) 10 MG tablet Take 1 tablet (10 mg total) by mouth at bedtime. 30 tablet 0   apixaban (ELIQUIS) 5 MG TABS tablet Take 1 tablet (5 mg total) by mouth 2 (two) times daily. 60 tablet 11   cephALEXin (KEFLEX) 500 MG capsule Take 1 capsule (500 mg total) by mouth daily. Take 1 at bedtime every day. 30 capsule 2   cyclobenzaprine (FLEXERIL) 5 MG tablet Take 1 tablet (5 mg total) by mouth 3 (three) times daily as needed for muscle spasms. 30 tablet 0   famotidine (PEPCID) 20 MG tablet Take 1 tablet (20 mg total) by mouth 2 (two) times daily. (Patient taking differently: Take 20 mg by mouth 2 (two) times daily as needed for heartburn or indigestion.) 60 tablet 0   lanthanum (FOSRENOL) 500 MG chewable tablet Chew 500 mg by mouth 3 (three) times daily with meals.  midodrine (PROAMATINE) 10 MG tablet Take 2 tablets (20 mg total) by mouth 3 (three) times daily with meals. 90 tablet 0   multivitamin (RENA-VIT) TABS tablet Take 1 tablet by mouth at bedtime. 30 tablet 0   ONDANSETRON HCL PO Take 1 tablet by mouth every 8 (eight) hours as needed (nausea or vomiting.).     sodium chloride (OCEAN) 0.65 % SOLN nasal spray Place 1 spray into both nostrils as needed for congestion.  0   ENTRESTO 97-103 MG Take 1 tablet by mouth 2 (two) times daily.     hydrOXYzine (ATARAX) 25 MG tablet Take 25 mg by mouth 2 (two) times daily. (Patient not taking: Reported on 08/18/2021)     SOAANZ 40 MG TABS Take 80 mg by mouth daily. (Patient not taking: Reported on 08/18/2021)       Allergies: Allergies as of 08/18/2021 - Review Complete 08/18/2021  Allergen Reaction Noted   Coreg [carvedilol] Shortness Of Breath and Diarrhea 07/07/2020   Heparin Other (See Comments) 03/05/2021   Past Medical History:  Diagnosis Date   Biventricular congestive heart failure ([place])    Last Echo 11/2019 at [facility] reveals EF 20%   Class 3 severe obesity due to excess calories with serious comorbidity and body mass index (BMI) of 50.0 to 59.9 in adult [facility]) 02/26/2020   Essential hypertension 02/26/2020   GERD without esophagitis 02/26/2020   Hidradenitis suppurativa 02/26/2020   OSA (obstructive sleep apnea) 02/26/2020   Prediabetes 02/26/2020   Renal disorder    Social History:   Lives with mother and uncle  Employment- drives a school bus   Denies tobacco, alcohol, and illicit drug use Per chart review: Quit smoking in Oct 2019, used to smoke about 6 cigars per day for about 2 years. IADLs/ADLs- can person independently at baseline    Review of Systems: A complete ROS was negative except as per HPI.     Physical Exam: Blood pressure (!) 127/110, pulse (!) 101, temperature 98.4 F (36.9 C), temperature source Oral, resp. rate 16, height 6' (1.829 m), weight (!) 183.7 kg, SpO2 97 %.   Constitutional: alert, ill-appearing, and in moderate distress  Neck: difficult to assess JVD d/t body habitus  Cardiovascular: regular rhythm, tachycardic, no m/r/g, 1+ bilateral LE pitting edema  Pulmonary/Chest: increased work of breathing on 4L [place], bilateral crackles in lower lung fields.  Abdominal: soft, non-tender to palpation, distended Neurological: A&O x 3, follows commands  Skin: cool and dry   EKG: ST   CXR: bilateral pulmonary edema    Assessment & Plan by Problem: Principal Problem:   Volume overload     Phillip Young is a 36 y.o. male with a pertinent PMH of HTN, T2DM, paroxysmal fibrillation on Elqiuis, chronic HFrEF (EF 20%), OSA on CPAP, and ESRD on HD TTS  admitted for HD cath replacement.      Volume overload 2/2 missed HD session Malfunctioning HD catheter  ESRD on HD TTS  Chronic HFrEF (EF 20%) Recent admission in Sept 2022 for cardiogenic shock and cardiorenal syndrome with progression to ESRD. Follows closely with the Heart Failure team. Last ECHO from 04/2021 with LVEF 20% with global hypokinesis. Unclear if taking Entresto (recently refilled), and takes Midodrine 20 tid. Missed HD in setting of right IJ catheter malfunction and is now fluid overloaded but difficult to clinically appreciate on exam. 12 kg above his dry weight and CTA with pulmonary edema but no PE. No electrolyte derangements. Nephrology on board, with plans for  serial HD sessions after IR guided HD catheter exchange possibly tomorrow. No permanent access yet as pt did not follow up with vascular surgery as an outpatient, plan for [facility] consult tmrw for fistula, per nephrology.  - Right IR catheter replacement per IR   - Serial HD per nephrology  - VVS consult in AM for fistula  - Fluid restriction  - Strict I/O's and daily weights  - Started Midodrine 10 mg tid (takes 20 tid at home) - Renal panel    PAF In SR currently. Rate in 100's. Takes Amiodarone and Eliquis. - Continue Amiodarone  - Continue Eliquis      DM2 Last A1c 6.5 (02/11/21). Not on any diabetes medications.  - SSI - CBG monitoring  - A1c   Hx of HTN Normotensive on admission. Not on any antihypertensive agents.     Anemia of chronic diease  Chronic and stable, Hgb 9.6. On ESA.     OSA on CPAP - Continue CPAP    Class 3 obesity  - Encourage weight loss    MSSA septic arthritis of left sacroiliac joint and sacral osteomyelitis (03/2021) Hx of MRSA infection (2018) Initially on IV antibiotics and transitioned to Keflex at d/c in 04/2021. Follows with Dr Vu/ ID and recent MRI showing improvement in septic arthritis and resolution of abscesses in joint. Plan for follow up with ID on 08/26/21. -  Continue Keflex  - Follow up with ID    GERD - Continue Pepcid    Chronic cough Has hx of chronic cough with NBNB vomiting. Unclear if this related to underlying GERD.  - Tessalon prn    Pruritis   - Hydroxyzine prn      Best Practice: Diet: NPO IVF: None,None VTE: Eliquis Code: Full     Lajean Manes, MD  Internal Medicine Resident, PGY-1 [facility]  Pager: [phone] 10:42 PM, 08/18/2021

## 2021-08-19 NOTE — Progress Notes (Signed)
°  Transition of Care [facility]) Screening Note   Patient Details  Name: [name] Date of Birth: [birth year]   Transition of Care [facility]) CM/SW Contact:    Tom-Johnson, Renea Ee, RN Phone Number: 08/19/2021, 11:16 AM  Patient is from home with mother and uncle. Admitted for Volume overload. Currently employed at [employer]. Independent with care and drives self prior to hospitalization. On 2L chronic Oxygen and gets supplies from [employer]. States he has enough supplies at [employer] and will order when needed. Does not have a PCP and agrees to followup at [facility] post discharge. Uses [facility] pharmacy on [address] in [place]. Transition of Care Department [facility]) has reviewed patient and no TOC needs have been identified at this time. We will continue to monitor patient advancement through interdisciplinary progression rounds. If new patient transition needs arise, please place a TOC consult.

## 2021-08-19 NOTE — Progress Notes (Signed)
Upper extremity venous mapping study completed.   Please see CV Proc for preliminary results.   Jinelle Butchko, RDMS, RVT  

## 2021-08-19 NOTE — Progress Notes (Signed)
Patient refused cpap for the night  

## 2021-08-19 NOTE — Consult Note (Addendum)
Hospital Consult    Reason for Consult:  permanent access Requesting Physician:  Dr. Jonnie Finner MRN #:  [id number]  History of Present Illness: This is a 36 y.o. male with ESRD who was admitted after missing dialysis due to a malfunctioning dialysis catheter. Vascular surgery has been consulted for evaluation for permanent dialysis access. Patient was scheduled to see Dr. Carlis Abbott in our office in December, but missed this appointment and a subsequent appointment earlier this month. He explains that he has been very skeptical and in denial that he will need dialysis the rest of his life. He currently dialyzes via right IJ TDC at the [facility] location. He is scheduled to have his malfunctioning catheter exchanged by IR today. Patient has never had any other access or central lines. He does not have any pacemaker. He is right handed. He has no prior upper extremity surgery or limitations.   Past Medical History:  Diagnosis Date   Biventricular congestive heart failure ([place])    Last Echo 11/2019 at [facility] reveals EF 20%   Class 3 severe obesity due to excess calories with serious comorbidity and body mass index (BMI) of 50.0 to 59.9 in adult [facility]) 02/26/2020   Essential hypertension 02/26/2020   GERD without esophagitis 02/26/2020   Hidradenitis suppurativa 02/26/2020   OSA (obstructive sleep apnea) 02/26/2020   Prediabetes 02/26/2020   Renal disorder     Past Surgical History:  Procedure Laterality Date   ABSCESS DRAINAGE     IR FLUORO GUIDE CV LINE RIGHT  03/10/2021   IR FLUORO GUIDE CV LINE RIGHT  04/22/2021   IR US GUIDE VASC ACCESS RIGHT  03/10/2021   IR US GUIDE VASC ACCESS RIGHT  04/22/2021   RIGHT HEART CATH N/A 03/06/2021   Procedure: RIGHT HEART CATH;  Surgeon: Jolaine Artist, MD;  Location: [place] [facility];  Service: Cardiovascular;  Laterality: N/A;   RIGHT/LEFT HEART CATH AND CORONARY ANGIOGRAPHY N/A 03/04/2020   Procedure: RIGHT/LEFT HEART CATH AND CORONARY  ANGIOGRAPHY;  Surgeon: Larey Dresser, MD;  Location: [place] [facility];  Service: Cardiovascular;  Laterality: N/A;   TEE WITHOUT CARDIOVERSION N/A 05/05/2021   Procedure: TRANSESOPHAGEAL ECHOCARDIOGRAM (TEE);  Surgeon: Larey Dresser, MD;  Location: [facility] ENDOSCOPY;  Service: Cardiovascular;  Laterality: N/A;   TEMPORARY DIALYSIS CATHETER  03/06/2021   Procedure: TEMPORARY DIALYSIS CATHETER;  Surgeon: Jolaine Artist, MD;  Location: [place] [facility];  Service: Cardiovascular;;    Allergies  Allergen Reactions   Coreg [Carvedilol] Shortness Of Breath and Diarrhea    Wheezing    Heparin Other (See Comments)    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 Authorizing Provider  amiodarone (PACERONE) 200 MG tablet Take 1 tablet (200 mg total) by mouth daily. 05/19/21  Yes Samella Parr, NP  amitriptyline (ELAVIL) 10 MG tablet Take 1 tablet (10 mg total) by mouth at bedtime. 05/19/21  Yes Samella Parr, NP  apixaban (ELIQUIS) 5 MG TABS tablet Take 1 tablet (5 mg total) by mouth 2 (two) times daily. 03/26/21  Yes Carollo, Farley Ly, NP  cephALEXin (KEFLEX) 500 MG capsule Take 1 capsule (500 mg total) by mouth daily. Take 1 at bedtime every day. 08/04/21 11/02/21 Yes Vu, Rockey Situ, MD  cyclobenzaprine (FLEXERIL) 5 MG tablet Take 1 tablet (5 mg total) by mouth 3 (three) times daily as needed for muscle spasms. 05/19/21  Yes Lissa Merlin,  Leisa Lenz, NP  famotidine (PEPCID) 20 MG tablet Take 1 tablet (20 mg total) by mouth 2 (two) times daily. Patient taking differently: Take 20 mg by mouth 2 (two) times daily as needed for heartburn or indigestion. 05/19/21  Yes Samella Parr, NP  lanthanum (FOSRENOL) 500 MG chewable tablet Chew 500 mg by mouth 3 (three) times daily with meals.   Yes [provider]  midodrine (PROAMATINE) 10 MG tablet Take 2 tablets (20 mg total) by mouth 3 (three) times daily with meals.  05/19/21  Yes Samella Parr, NP  multivitamin (RENA-VIT) TABS tablet Take 1 tablet by mouth at bedtime. 05/19/21  Yes Samella Parr, NP  ONDANSETRON HCL PO Take 1 tablet by mouth every 8 (eight) hours as needed (nausea or vomiting.).   Yes [provider]  sodium chloride (OCEAN) 0.65 % SOLN nasal spray Place 1 spray into both nostrils as needed for congestion. 05/14/21  Yes Samella Parr, NP  ENTRESTO 97-103 MG Take 1 tablet by mouth 2 (two) times daily. 08/14/21   [provider]  hydrOXYzine (ATARAX) 25 MG tablet Take 25 mg by mouth 2 (two) times daily. Patient not taking: Reported on 08/18/2021 08/17/21   [provider]  SOAANZ 40 MG TABS Take 80 mg by mouth daily. Patient not taking: Reported on 08/18/2021 04/07/21   [provider]    Social History   Socioeconomic History   Marital status: Single    Spouse name: Not on file   Number of children: Not on file   Years of education: Not on file   Highest education level: Not on file  Occupational History   Not on file  Tobacco Use   Smoking status: Former    Packs/day: 1.00    Types: Cigarettes    Quit date: 2019    Years since quitting: 4.0   Smokeless tobacco: Former   Tobacco comments:    quit in 2019  Substance and Sexual Activity   Alcohol use: Never   Drug use: Never   Sexual activity: Not on file  Other Topics Concern   Not on file  Social History Narrative   Not on file   Social Determinants of Health   Financial Resource Strain: Low Risk    Difficulty of Paying Living Expenses: Not very hard  Food Insecurity: Food Insecurity Present   Worried About [place] in the Last Year: Never true   Ran Out of Food in the Last Year: Sometimes true  Transportation Needs: No Transportation Needs   Lack of Transportation (Medical): No   Lack of Transportation (Non-Medical): No  Physical Activity: Not on file  Stress: Not on file  Social Connections: Not on file   Intimate Partner Violence: Not on file     Family History  Problem Relation Age of Onset   Heart disease Mother    Hypertension Mother    Pulmonary Hypertension Mother    Drug abuse Father        died due to Heroin overdose    ROS: Otherwise negative unless mentioned in HPI  Physical Examination  Vitals:   08/19/21 0340 08/19/21 0916  BP: 106/78 112/72  Pulse: 98 99  Resp: (!) 25 (!) 21  Temp:  98.7 F (37.1 C)  SpO2: 98% 100%   Body mass index is 54.93 kg/m.  General: Obese male, very irritable  Gait: Not observed HENT: WNL, normocephalic Pulmonary: normal non-labored breathing Cardiac: regular; right IJ TDC Abdomen:  obese, soft Vascular Exam/Pulses: 2+ ulnar pulses bilaterally, unable to palpate radial pulses, hands warm and well perfused. 5/5 grip strength bilaterally. Normal ROM Extremities: without ischemic changes, without Gangrene , without cellulitis; without open wounds Musculoskeletal: no muscle wasting or atrophy  Neurologic: A&O X 3;  No focal weakness or paresthesias are detected; speech is fluent/normal Psychiatric:  The pt has Normal affect   CBC    Component Value Date/Time   WBC 8.0 08/19/2021 0540   RBC 2.79 (L) 08/19/2021 0540   HGB 9.0 (L) 08/19/2021 0540   HCT 27.4 (L) 08/19/2021 0540   PLT 251 08/19/2021 0540   MCV 98.2 08/19/2021 0540   MCH 32.3 08/19/2021 0540   MCHC 32.8 08/19/2021 0540   RDW 19.0 (H) 08/19/2021 0540   LYMPHSABS 1.8 08/18/2021 1132   MONOABS 0.6 08/18/2021 1132   EOSABS 0.3 08/18/2021 1132   BASOSABS 0.1 08/18/2021 1132    BMET    Component Value Date/Time   NA 133 (L) 08/19/2021 0540   K 4.0 08/19/2021 0540   CL 96 (L) 08/19/2021 0540   CO2 21 (L) 08/19/2021 0540   GLUCOSE 94 08/19/2021 0540   BUN 62 (H) 08/19/2021 0540   CREATININE 10.36 (H) 08/19/2021 0540   CALCIUM 9.4 08/19/2021 0540   GFRNONAA 6 (L) 08/19/2021 0540   GFRAA >60 03/27/2020 1055    COAGS: Lab Results  Component Value Date    INR 1.3 (H) 04/23/2021   INR 1.3 (H) 03/23/2021   INR 1.4 (H) 02/24/2021     Non-Invasive Vascular Imaging:   Bilateral upper extremity vein mapping pending  Statin:  No. Beta Blocker:  No. Aspirin:  No. ACEI:  No. ARB:  No. CCB use:  Yes Other antiplatelets/anticoagulants:  Yes.   Eliquis   ASSESSMENT/PLAN: This is a 36 y.o. male with ESRD who presented with malfunctioning right IJ TDC. He is scheduled to have catheter exchanged by IR today. He has missed prior appointments for outpatient evaluation for access due to apprehension about the process. He does seem to have a good understanding of what a fistula is. I did discussed fistula vs. Graft with the patient. I discussed the Risk, benefits, and alternatives to access surgery. The patient is aware the risks include but are not limited to: bleeding, infection, steal syndrome, nerve damage, thrombosis, failure to mature, and need for additional procedures. He is now agreeable to proceed with placement of permanent access. Upper extremity vein mapping has been ordered. Plan will be for left upper extremity AV Fistula vs. AV graft. The on call vascular surgeon, Dr. Trula Slade will see patient later today and will provide further details on surgical planning   Karoline Caldwell PA-C [facility] [phone] 08/19/2021  11:30 AM  Plan for fistula vs graft on Friday.  Stopping Eliquis and starting heparin per pharmacy.  Vein map pending  Annamarie Major

## 2021-08-19 NOTE — Progress Notes (Signed)
HD#0 Subjective:  Overnight Events: NA  Patient is on CPAP to reevaluation.  States that he uses PAP at night to sleep.  He denies any new complaint since admission.  He is still making urine.  Objective:  Vital signs in last 24 hours: Vitals:   08/19/21 0156 08/19/21 0323 08/19/21 0340 08/19/21 0916  BP: 107/71 106/78 106/78 112/72  Pulse: (!) 101 100 98 99  Resp: 20 (!) 24 (!) 25 (!) 21  Temp: 98 F (36.7 C) 98.3 F (36.8 C)  98.7 F (37.1 C)  TempSrc: Oral     SpO2: 100% 96% 98% 100%  Weight:      Height:       Supplemental O2: CPAP SpO2: 100 % O2 Flow Rate (L/min): 4 L/min   Physical Exam:  Physical Exam Constitutional:      General: He is not in acute distress. HENT:     Head: Normocephalic.  Eyes:     General:        Right eye: No discharge.        Left eye: No discharge.     Conjunctiva/sclera: Conjunctivae normal.  Cardiovascular:     Rate and Rhythm: Normal rate and regular rhythm.     Comments: Unable to assess JVD due to body habitus.  +1 bilateral lower extremities Pulmonary:     Effort: Pulmonary effort is normal. No respiratory distress.  Skin:    General: Skin is warm.  Neurological:     Mental Status: He is alert.  Psychiatric:        Mood and Affect: Mood normal.        Behavior: Behavior normal.    Filed Weights   08/18/21 1520  Weight: (!) 183.7 kg     Intake/Output Summary (Last 24 hours) at 08/19/2021 1052 Last data filed at 08/19/2021 0700 Gross per 24 hour  Intake 240 ml  Output 250 ml  Net -10 ml   Net IO Since Admission: -10 mL [08/19/21 1052]  Pertinent Labs: CBC Latest Ref Rng & Units 08/19/2021 08/18/2021 08/18/2021  WBC 4.0 - 10.5 K/uL 8.0 - 8.7  Hemoglobin 13.0 - 17.0 g/dL 9.0(L) 10.2(L) 9.6(L)  Hematocrit 39.0 - 52.0 % 27.4(L) 30.0(L) 29.7(L)  Platelets 150 - 400 K/uL 251 - 282    CMP Latest Ref Rng & Units 08/19/2021 08/18/2021 08/18/2021  Glucose 70 - 99 mg/dL 94 - 94  BUN 6 - 20 mg/dL 62(H) - 53(H)   Creatinine 0.61 - 1.24 mg/dL 10.36(H) - 9.91(H)  Sodium 135 - 145 mmol/L 133(L) 136 135  Potassium 3.5 - 5.1 mmol/L 4.0 3.8 3.9  Chloride 98 - 111 mmol/L 96(L) - 99  CO2 22 - 32 mmol/L 21(L) - 21(L)  Calcium 8.9 - 10.3 mg/dL 9.4 - 9.5  Total Protein 6.5 - 8.1 g/dL - - 7.6  Total Bilirubin 0.3 - 1.2 mg/dL - - 1.8(H)  Alkaline Phos 38 - 126 U/L - - 99  AST 15 - 41 U/L - - 72(H)  ALT 0 - 44 U/L - - 147(H)    Imaging: DG Chest 2 View  Result Date: 08/18/2021 CLINICAL DATA:  36 year old male with history of shortness of breath and cough. EXAM: CHEST - 2 VIEW COMPARISON:  Chest x-ray 08/13/2021. FINDINGS: Right internal jugular PermCath with tip terminating in the distal superior vena cava. Lung volumes are slightly low. No acute consolidative airspace disease. No pleural effusions. No pneumothorax. Cephalization of the pulmonary vasculature, without frank pulmonary edema. Moderate cardiomegaly.  Upper mediastinal contours are within normal limits. IMPRESSION: 1. Moderate cardiomegaly with pulmonary venous congestion, but no frank pulmonary edema. Electronically Signed   By: Vinnie Langton M.D.   On: 08/18/2021 10:58   CT Angio Chest PE W and/or Wo Contrast  Result Date: 08/18/2021 CLINICAL DATA:  Shortness of breath EXAM: CT ANGIOGRAPHY CHEST WITH CONTRAST TECHNIQUE: Multidetector CT imaging of the chest was performed using the standard protocol during bolus administration of intravenous contrast. Multiplanar CT image reconstructions and MIPs were obtained to evaluate the vascular anatomy. RADIATION DOSE REDUCTION: This exam was performed according to the departmental dose-optimization program which includes automated exposure control, adjustment of the mA and/or kV according to patient size and/or use of iterative reconstruction technique. CONTRAST:  174mL OMNIPAQUE IOHEXOL 350 MG/ML SOLN COMPARISON:  CT chest dated October 09, 2020 FINDINGS: Cardiovascular: Adequate contrast opacification of the  pulmonary arteries. Limited evaluation of the proximal segmental and subsegmental pulmonary arteries due to patient body habitus cardiomegaly. Small pericardial effusion. No significant coronary artery calcifications or atherosclerotic disease of the thoracic aorta. Right central venous line with tip near the superior cavoatrial junction. Mediastinum/Nodes: Enlarged mediastinal and hilar lymph nodes, unchanged when compared with prior CT. Reference periaortic lymph node located on series 3, image 99 measuring 1.8 cm in short axis. Lungs/Pleura: Central airways are patent. Mild bilateral air trapping. Diffuse bilateral ground-glass opacities. No pleural effusion or pneumothorax. Upper Abdomen: Reflux of contrast is noted in the hepatic veins which can be seen in the setting of right heart dysfunction. Musculoskeletal: No chest wall abnormality. No acute or significant osseous findings. Review of the MIP images confirms the above findings. IMPRESSION: 1. Cardiomegaly and small pericardial effusion. 2. Diffuse bilateral ground-glass opacities, likely due to pulmonary edema. 3. Bilateral air trapping, findings can be seen in the setting small airways disease. Electronically Signed   By: Yetta Glassman M.D.   On: 08/18/2021 14:29    Assessment/Plan:   Principal Problem:   Volume overload Active Problems:   Acute on chronic systolic (congestive) heart failure ([name])   Morbid obesity ([name])   OSA (obstructive sleep apnea)   Chronic cough   ESRD (end stage renal disease) ([name])   Paroxysmal atrial flutter ([place])   MSSA bacteremia   Patient Summary: Phillip Young is a 36 y.o. male with a pertinent PMH of HTN, T2DM, paroxysmal fibrillation on Elqiuis, chronic HFrEF (EF 20%), OSA on CPAP, and ESRD on HD TTS admitted for HD cath replacement.   ESRD on HD TTS  Malfunction HD catheter His ESRD was from cardiogenic shock and cardiorenal in September 2022. Patient appears volume up on exam.  He is 12 kg above  his dry weight with evidence of pulm edema on CT.  He will get his HD catheter exchanged out today with IR and HD today.  He will also get a permanent access placed during this admission.  Nephrology will consult vascular. -Appreciate nephrology recommendation -Strict INO's and daily weights -Trend renal function  Chronic HFrEF Nonischemic cardiomyopathy Patient history of HFrEF status post cardiogenic shock in September 2022.  Echocardiogram in October 2022 show EF of 20%, nonischemic in nature.  Could not get cardiac MRI due to body habitus.  Patient is not a candidate for ICD either given BMI. -Continue midodrine 20 mg 3 times daily -Volume control with HD  Paroxysmal Afib -Continue amiodarone -Continue Eliquis.  May need to hold it for his permanent access placement  Elevated LFT He has similar presentation during his admission on  1/19.  Hepatitis panel was negative at that time.  It was thought due to congestive liver.  I do not see a right upper quadrant ultrasound in Care Everywhere. -Pending right upper quadrant ultrasound -Trend CMP  Anemia of chronic disease -On ESA  MSSA septic arthritis of left sacroiliac joint and sacral osteomyelitis (03/2021) Hx of MRSA infection (2018) Initially on IV antibiotics and transitioned to Keflex at d/c in 04/2021. Follows with Dr Vu/ ID and recent MRI showing improvement in septic arthritis and resolution of abscesses in joint. Plan for follow up with ID on 08/26/21. - Continue Keflex  - Follow up with ID   OSA -CPAP at night  Type 2 diabetes A1c of 6.5 in July. -Repeat A1c  Diet: N.p.o. for procedure Full code VTE: Eliquis IVF: NA  Dispo: Anticipated discharge to Home in 4 days pending hemodialysis and permanent access.   Gaylan Gerold, DO 08/19/2021, 10:52 AM Pager: [phone]  Please contact the on call pager after 5 pm and on weekends at [phone].

## 2021-08-19 NOTE — Progress Notes (Signed)
Placed patient on bipap for the night  

## 2021-08-19 NOTE — Progress Notes (Signed)
New Admission Note:  Arrival Method: Via wheelchair from ED to 18m03 Mental Orientation: Alert & Oriented x4 Telemetry: CCMD verified.. Box-40m03 Assessment: Completed Skin: Refer to flowsheet IV: Right FA Pain: 0/10 Tubes: Right Chest HD Catheter Safety Measures: Safety Fall Prevention Plan discussed with patient. Admission: Completed [facility] [place] Orientation: Patient has been orientated to the room, unit and the staff.  Orders have been reviewed and are being implemented. Will continue to monitor the patient. Call light has been placed within reach.  Vassie Moselle, RN  Phone Number: [phone]

## 2021-08-20 ENCOUNTER — Observation Stay (HOSPITAL_COMMUNITY): Payer: BC Managed Care – PPO

## 2021-08-20 ENCOUNTER — Inpatient Hospital Stay (HOSPITAL_COMMUNITY): Payer: BC Managed Care – PPO

## 2021-08-20 DIAGNOSIS — I428 Other cardiomyopathies: Secondary | ICD-10-CM | POA: Diagnosis present

## 2021-08-20 DIAGNOSIS — G4733 Obstructive sleep apnea (adult) (pediatric): Secondary | ICD-10-CM | POA: Diagnosis present

## 2021-08-20 DIAGNOSIS — I132 Hypertensive heart and chronic kidney disease with heart failure and with stage 5 chronic kidney disease, or end stage renal disease: Secondary | ICD-10-CM | POA: Diagnosis present

## 2021-08-20 DIAGNOSIS — K219 Gastro-esophageal reflux disease without esophagitis: Secondary | ICD-10-CM | POA: Diagnosis present

## 2021-08-20 DIAGNOSIS — L299 Pruritus, unspecified: Secondary | ICD-10-CM | POA: Diagnosis present

## 2021-08-20 DIAGNOSIS — B9561 Methicillin susceptible Staphylococcus aureus infection as the cause of diseases classified elsewhere: Secondary | ICD-10-CM | POA: Diagnosis present

## 2021-08-20 DIAGNOSIS — D509 Iron deficiency anemia, unspecified: Secondary | ICD-10-CM | POA: Diagnosis present

## 2021-08-20 DIAGNOSIS — E8779 Other fluid overload: Secondary | ICD-10-CM | POA: Diagnosis not present

## 2021-08-20 DIAGNOSIS — I5082 Biventricular heart failure: Secondary | ICD-10-CM | POA: Diagnosis present

## 2021-08-20 DIAGNOSIS — I4892 Unspecified atrial flutter: Secondary | ICD-10-CM | POA: Diagnosis present

## 2021-08-20 DIAGNOSIS — I480 Paroxysmal atrial fibrillation: Secondary | ICD-10-CM | POA: Diagnosis present

## 2021-08-20 DIAGNOSIS — N2581 Secondary hyperparathyroidism of renal origin: Secondary | ICD-10-CM | POA: Diagnosis present

## 2021-08-20 DIAGNOSIS — R7989 Other specified abnormal findings of blood chemistry: Secondary | ICD-10-CM | POA: Diagnosis not present

## 2021-08-20 DIAGNOSIS — R7881 Bacteremia: Secondary | ICD-10-CM | POA: Diagnosis present

## 2021-08-20 DIAGNOSIS — E1122 Type 2 diabetes mellitus with diabetic chronic kidney disease: Secondary | ICD-10-CM | POA: Diagnosis present

## 2021-08-20 DIAGNOSIS — Z992 Dependence on renal dialysis: Secondary | ICD-10-CM | POA: Diagnosis not present

## 2021-08-20 DIAGNOSIS — Y712 Prosthetic and other implants, materials and accessory cardiovascular devices associated with adverse incidents: Secondary | ICD-10-CM | POA: Diagnosis present

## 2021-08-20 DIAGNOSIS — N186 End stage renal disease: Secondary | ICD-10-CM | POA: Diagnosis present

## 2021-08-20 DIAGNOSIS — I5023 Acute on chronic systolic (congestive) heart failure: Secondary | ICD-10-CM | POA: Diagnosis not present

## 2021-08-20 DIAGNOSIS — T8241XA Breakdown (mechanical) of vascular dialysis catheter, initial encounter: Secondary | ICD-10-CM | POA: Diagnosis present

## 2021-08-20 DIAGNOSIS — D631 Anemia in chronic kidney disease: Secondary | ICD-10-CM | POA: Diagnosis present

## 2021-08-20 DIAGNOSIS — Z6841 Body Mass Index (BMI) 40.0 and over, adult: Secondary | ICD-10-CM | POA: Diagnosis not present

## 2021-08-20 DIAGNOSIS — E8770 Fluid overload, unspecified: Secondary | ICD-10-CM | POA: Diagnosis present

## 2021-08-20 DIAGNOSIS — Z813 Family history of other psychoactive substance abuse and dependence: Secondary | ICD-10-CM | POA: Diagnosis not present

## 2021-08-20 DIAGNOSIS — Z20822 Contact with and (suspected) exposure to covid-19: Secondary | ICD-10-CM | POA: Diagnosis present

## 2021-08-20 HISTORY — PX: IR FLUORO GUIDE CV LINE RIGHT: IMG2283

## 2021-08-20 LAB — COMPREHENSIVE METABOLIC PANEL
ALT: 260 U/L — ABNORMAL HIGH (ref 0–44)
AST: 197 U/L — ABNORMAL HIGH (ref 15–41)
Albumin: 3.3 g/dL — ABNORMAL LOW (ref 3.5–5.0)
Alkaline Phosphatase: 100 U/L (ref 38–126)
Anion gap: 22 — ABNORMAL HIGH (ref 5–15)
BUN: 69 mg/dL — ABNORMAL HIGH (ref 6–20)
CO2: 22 mmol/L (ref 22–32)
Calcium: 9.9 mg/dL (ref 8.9–10.3)
Chloride: 95 mmol/L — ABNORMAL LOW (ref 98–111)
Creatinine, Ser: 11.41 mg/dL — ABNORMAL HIGH (ref 0.61–1.24)
GFR, Estimated: 5 mL/min — ABNORMAL LOW (ref >60)
Glucose, Bld: 102 mg/dL — ABNORMAL HIGH (ref 70–99)
Potassium: 4.2 mmol/L (ref 3.5–5.1)
Sodium: 139 mmol/L (ref 135–145)
Total Bilirubin: 1.7 mg/dL — ABNORMAL HIGH (ref 0.3–1.2)
Total Protein: 7.2 g/dL (ref 6.5–8.1)

## 2021-08-20 LAB — SURGICAL PCR SCREEN
MRSA, PCR: NEGATIVE
Staphylococcus aureus: NEGATIVE

## 2021-08-20 LAB — CBC
HCT: 27.4 % — ABNORMAL LOW (ref 39.0–52.0)
Hemoglobin: 9 g/dL — ABNORMAL LOW (ref 13.0–17.0)
MCH: 32.3 pg (ref 26.0–34.0)
MCHC: 32.8 g/dL (ref 30.0–36.0)
MCV: 98.2 fL (ref 80.0–100.0)
Platelets: 256 10*3/uL (ref 150–400)
RBC: 2.79 MIL/uL — ABNORMAL LOW (ref 4.22–5.81)
RDW: 18.8 % — ABNORMAL HIGH (ref 11.5–15.5)
WBC: 7.8 10*3/uL (ref 4.0–10.5)
nRBC: 0 % (ref 0.0–0.2)

## 2021-08-20 LAB — HEPATITIS B SURFACE ANTIBODY, QUANTITATIVE: Hep B S AB Quant (Post): >1000 m[IU]/mL (ref >9.9)

## 2021-08-20 IMAGING — XA IR FLUORO GUIDE CV LINE*R*
1 series · 1 of 1 positions shown · IV contrast (IODINE)
Comparison: none

INDICATION: ESRD requiring HD.  Malfunctioning HD catheter placed [DATE].

[Series 2: dsa 2 care · 1 of 1 slices shown]
[im 1/1]
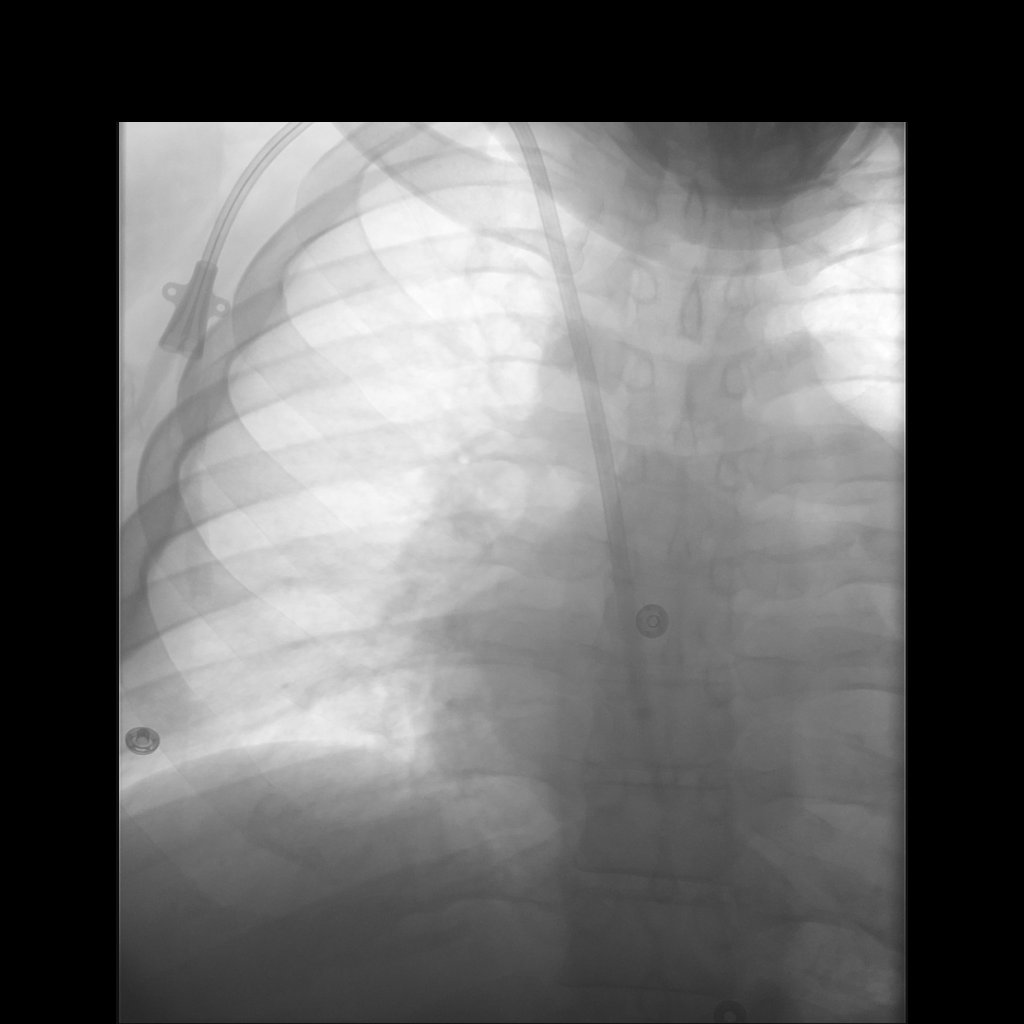

[1 of 1 positions shown; findings below may reference images not displayed]

EXAM:
TUNNELED CENTRAL VENOUS HEMODIALYSIS CATHETER REPLACEMENT WITH
FLUOROSCOPIC GUIDANCE

MEDICATIONS:
The patient was on scheduled antibiotics.

ANESTHESIA/SEDATION:
Local anesthetic was administered for the procedure.

FLUOROSCOPY TIME:  0 minutes 3 seconds (0.1 mGy).

COMPLICATIONS:
None immediate.

PROCEDURE:
Informed written consent was obtained from the patient and/or
patient's representative after a discussion of the risks, benefits,
and alternatives to treatment. Questions regarding the procedure
were encouraged and answered. The skin and external portion of the
existing hemodialysis catheter was prepped with chlorhexidine in a
sterile fashion, and a sterile drape was applied covering the
operative field. Maximum barrier sterile technique with sterile
gowns and gloves were used for the procedure. A timeout was
performed prior to the initiation of the procedure.

Both lumens of the hemodialysis catheter were cannulated with a
stiff Glidewire and advanced to the level of the right atrium. Under
intermittent fluoroscopic guidance, the existing dialysis catheter
was exchanged for a new 28 cm tip to cuff palindrome dialysis
catheter with tips ultimately terminating within the right atrium.
Final catheter positioning was confirmed and documented with a spot
radiographic image. The catheter aspirates and flushes normally. The
catheter was flushed with appropriate volume heparin dwells.

The catheter exit site was secured with a 0-Prolene retention
sutures. Both lumens were heparinized. A dressing was placed. The
patient tolerated the procedure well without immediate post
procedural complication.
IMPRESSION: Successful replacement for a 28 cm tip to cuff tunneled hemodialysis
catheter, with tips terminating within the right atrium.

The catheter is ready for immediate use.

## 2021-08-20 IMAGING — US US ABDOMEN LIMITED
1 series · 14 of 25 positions shown · non-contrast
Comparison: CT abdomen and pelvis [DATE].

CLINICAL DATA: Increased LFTs.

EXAM:
ULTRASOUND ABDOMEN LIMITED RIGHT UPPER QUADRANT

[Series 1: us abdomen limited ruq (liver/gb) · 14 of 45 slices shown]
[im 1/45]
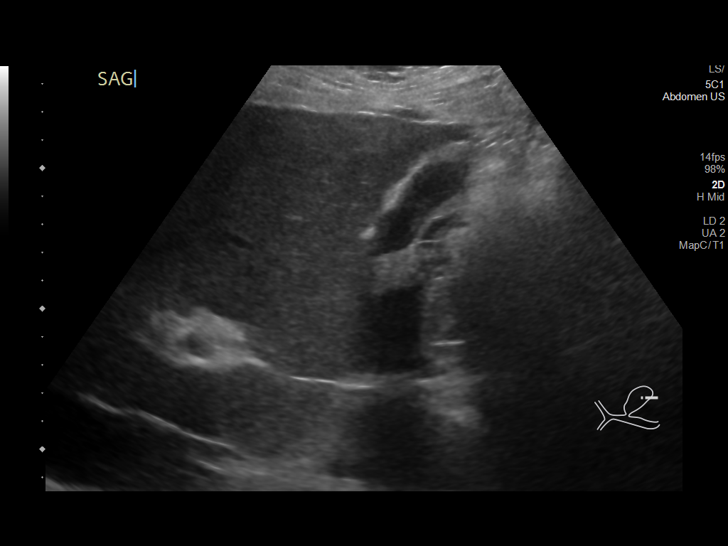
[im 4/45]
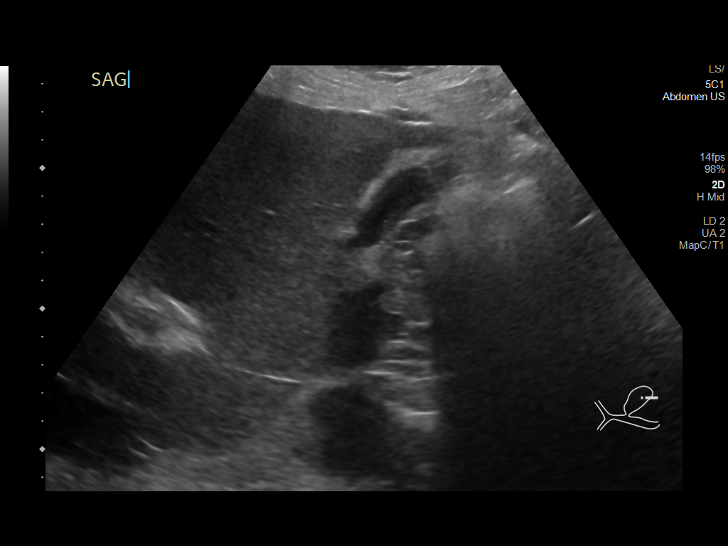
[im 8/45]
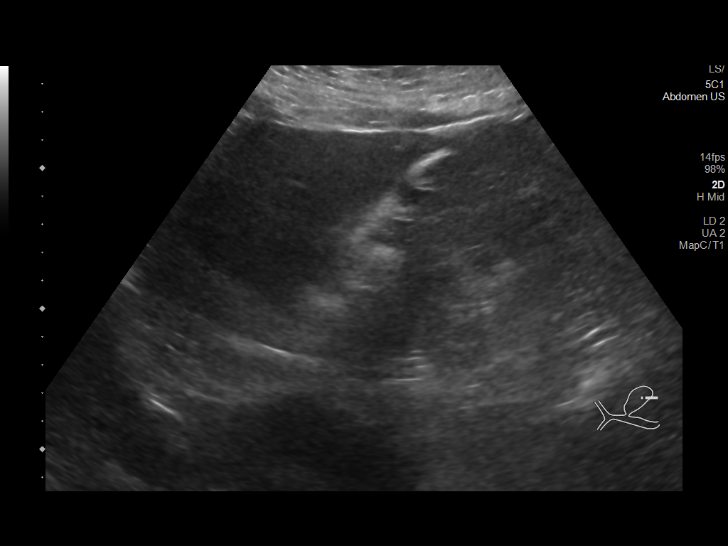
[im 12/45]
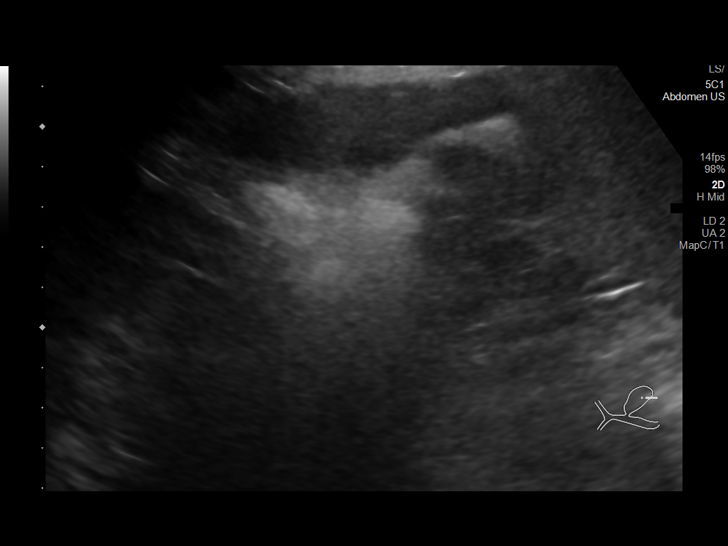
[im 15/45]
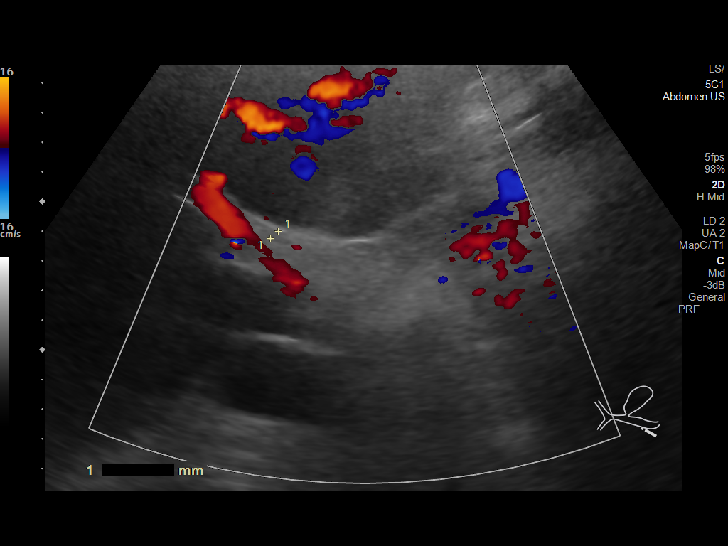
[im 17/45]
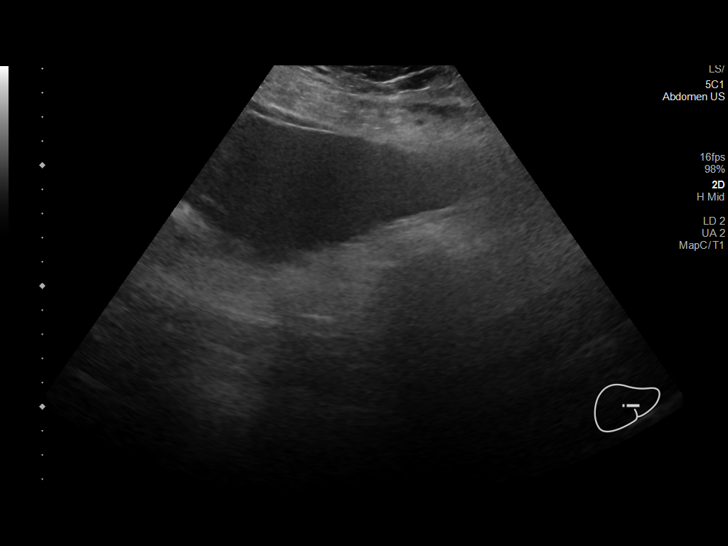
[im 21/45]
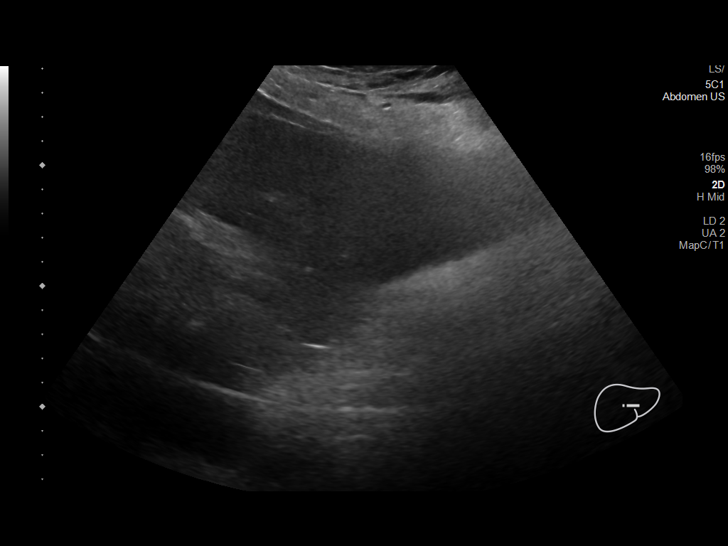
[im 24/45]
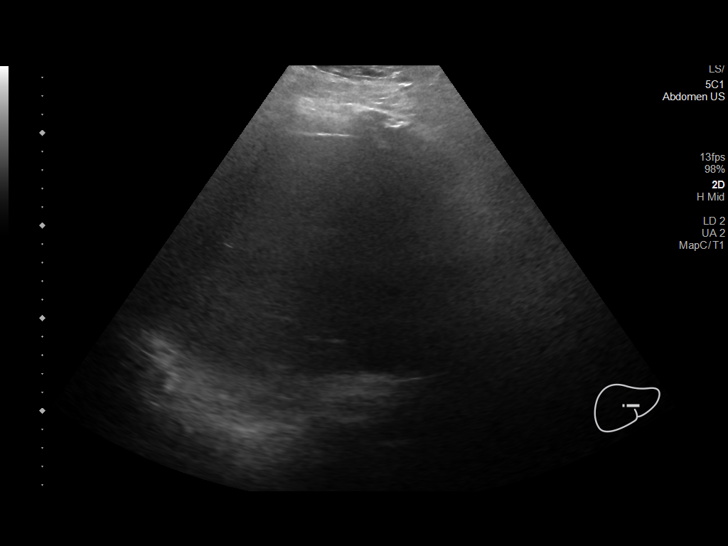
[im 28/45]
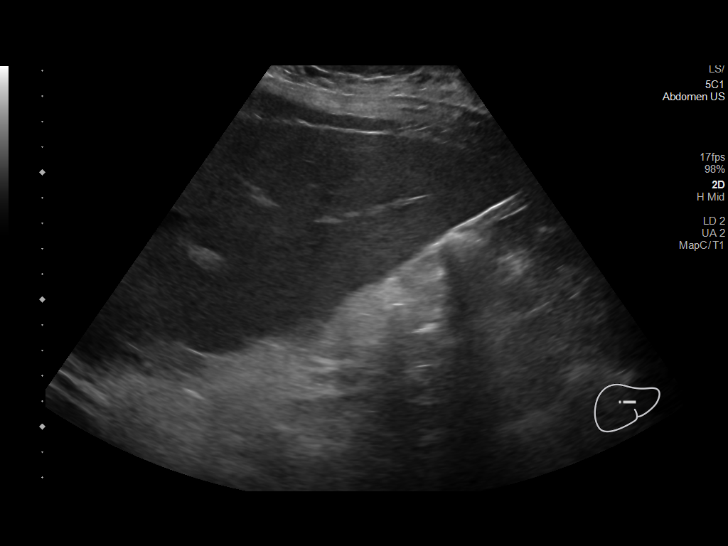
[im 30/45]
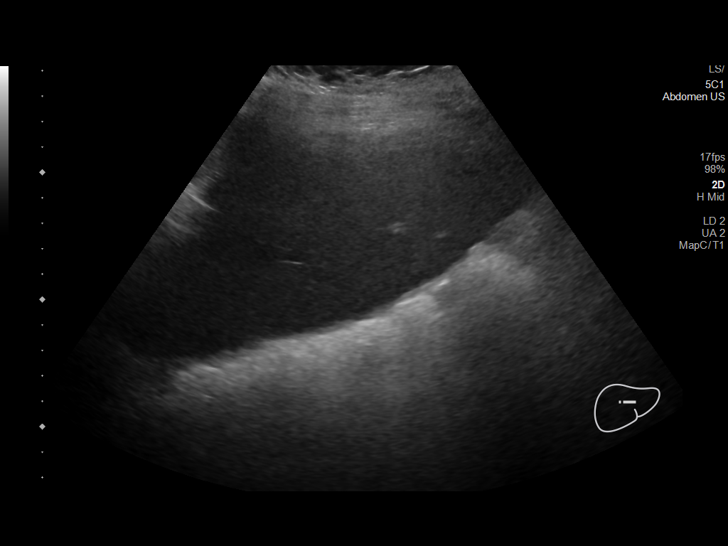
[im 34/45]
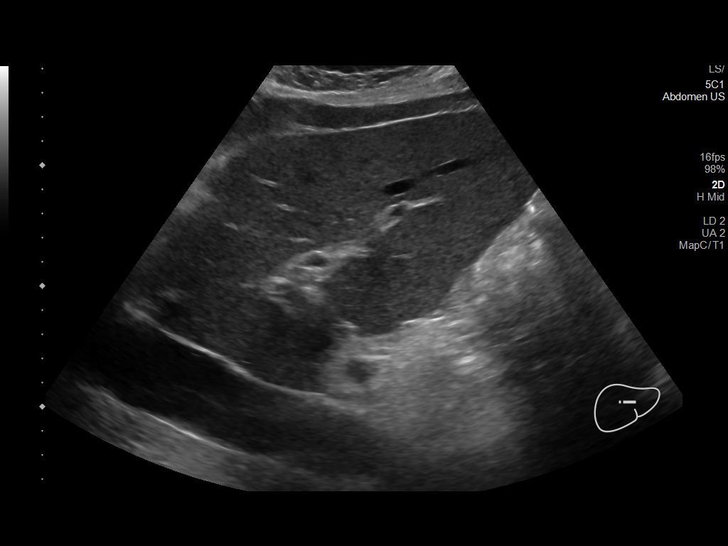
[im 37/45]
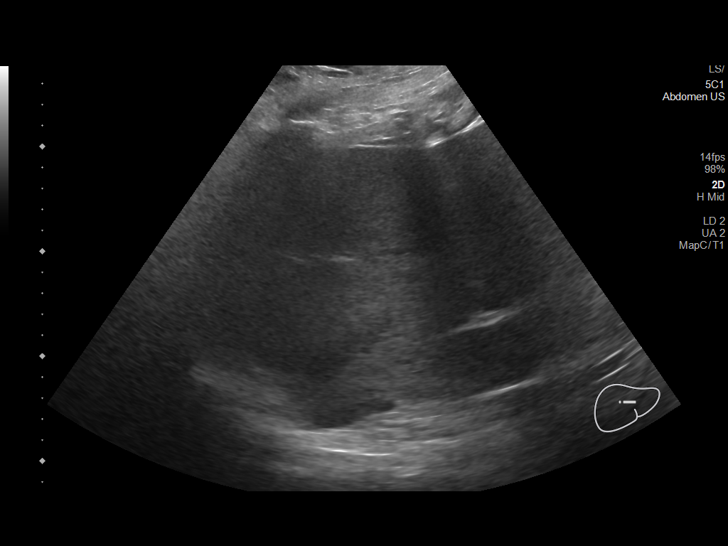
[im 41/45]
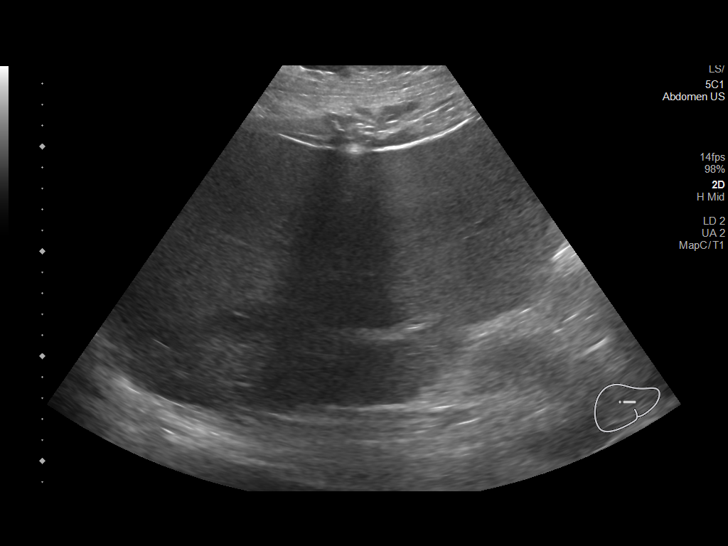
[im 45/45]
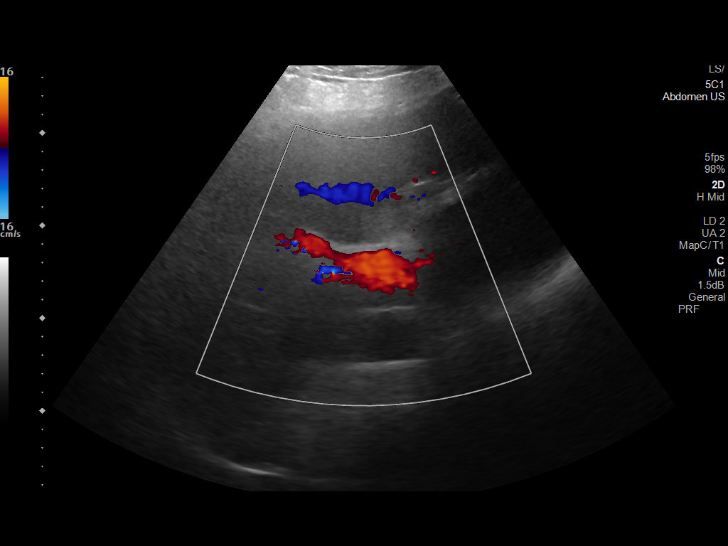

[14 of 25 positions shown; findings below may reference images not displayed]

FINDINGS: Gallbladder:

No gallstones or wall thickening visualized. No sonographic Murphy
sign noted by sonographer.

Common bile duct:

Diameter: 3.6 mm.

Liver:

No focal lesion identified. Within normal limits in parenchymal
echogenicity. Portal vein is patent on color Doppler imaging with
normal direction of blood flow towards the liver.

Other: None.
IMPRESSION: Normal right upper quadrant ultrasound.

## 2021-08-20 MED ORDER — ANTICOAGULANT SODIUM CITRATE 4% (200MG/5ML) IV SOLN
10.0000 mL | Freq: Once | Status: AC
  Administered 2021-08-22: 5 mL
  Filled 2021-08-20 (×3): qty 10
  Filled 2021-08-20: qty 5

## 2021-08-20 MED ORDER — DARBEPOETIN ALFA 60 MCG/0.3ML IJ SOSY: 60.0000 ug | PREFILLED_SYRINGE | INTRAMUSCULAR | Status: DC

## 2021-08-20 MED ORDER — LIDOCAINE HCL 1 % IJ SOLN
INTRAMUSCULAR | Status: AC
  Filled 2021-08-20: qty 20

## 2021-08-20 MED ORDER — SODIUM CHLORIDE 0.9 % IV SOLN
1.5000 g | INTRAVENOUS | Status: AC
  Administered 2021-08-21: 1.5 g via INTRAVENOUS
  Filled 2021-08-20: qty 1.5

## 2021-08-20 MED ORDER — DARBEPOETIN ALFA 60 MCG/0.3ML IJ SOSY
60.0000 ug | PREFILLED_SYRINGE | INTRAMUSCULAR | Status: DC
  Filled 2021-08-20: qty 0.3

## 2021-08-20 MED ORDER — LIDOCAINE HCL 1 % IJ SOLN
INTRAMUSCULAR | Status: DC | PRN
  Administered 2021-08-20: 10 mL via INTRADERMAL

## 2021-08-20 MED ORDER — DARBEPOETIN ALFA 60 MCG/0.3ML IJ SOSY
60.0000 ug | PREFILLED_SYRINGE | INTRAMUSCULAR | Status: DC
  Administered 2021-08-20: 60 ug via INTRAVENOUS
  Filled 2021-08-20: qty 0.3

## 2021-08-20 MED ORDER — HYDROXYZINE HCL 25 MG PO TABS
50.0000 mg | ORAL_TABLET | Freq: Once | ORAL | Status: AC
  Administered 2021-08-20: 50 mg via ORAL
  Filled 2021-08-20: qty 2

## 2021-08-20 MED ORDER — FAMOTIDINE 20 MG PO TABS
20.0000 mg | ORAL_TABLET | Freq: Every day | ORAL | Status: DC
  Administered 2021-08-22: 20 mg via ORAL
  Filled 2021-08-20: qty 1

## 2021-08-20 MED ORDER — ANTICOAGULANT SODIUM CITRATE 4% (200MG/5ML) IV SOLN
Status: DC | PRN
  Administered 2021-08-20: 4.2 mL

## 2021-08-20 MED ORDER — HEPARIN SODIUM (PORCINE) 1000 UNIT/ML IJ SOLN
INTRAMUSCULAR | Status: AC
  Filled 2021-08-20: qty 10

## 2021-08-20 NOTE — Progress Notes (Signed)
° ° °  Subjective  -  Awaiting tunneled catheter exchange   Physical Exam:  Palpable radial pulses Nonlabored breathing   Assessment/Plan:    End-stage renal disease: I discussed with the patient that we would plan on a left brachiocephalic fistula tomorrow.  I discussed the details of the procedure.  I also discussed that he will likely require a second operation for elevation of his fistula.  We talked about steal syndrome.  All of his questions were answered.  His Eliquis remains on hold.  He will be n.p.o. after midnight.  Phillip Young 08/20/2021 2:55 PM --  Vitals:   08/20/21 0924 08/20/21 1438  BP: 95/75 (!) 109/55  Pulse: 98 98  Resp: 18 18  Temp: 98.3 F (36.8 C) 98.2 F (36.8 C)  SpO2: 97% 100%    Intake/Output Summary (Last 24 hours) at 08/20/2021 1455 Last data filed at 08/20/2021 1152 Gross per 24 hour  Intake 1720 ml  Output 550 ml  Net 1170 ml     Laboratory CBC    Component Value Date/Time   WBC 7.8 08/20/2021 0235   HGB 9.0 (L) 08/20/2021 0235   HCT 27.4 (L) 08/20/2021 0235   PLT 256 08/20/2021 0235    BMET    Component Value Date/Time   NA 139 08/20/2021 0235   K 4.2 08/20/2021 0235   CL 95 (L) 08/20/2021 0235   CO2 22 08/20/2021 0235   GLUCOSE 102 (H) 08/20/2021 0235   BUN 69 (H) 08/20/2021 0235   CREATININE 11.41 (H) 08/20/2021 0235   CALCIUM 9.9 08/20/2021 0235   GFRNONAA 5 (L) 08/20/2021 0235   GFRAA >60 03/27/2020 1055    COAG Lab Results  Component Value Date   INR 1.5 (H) 08/19/2021   INR 1.3 (H) 04/23/2021   INR 1.3 (H) 03/23/2021   No results found for: PTT  Antibiotics Anti-infectives (From admission, onward)    Start     Dose/Rate Route Frequency Ordered Stop   08/21/21 0600  cefUROXime (ZINACEF) 1.5 g in sodium chloride 0.9 % 100 mL IVPB        1.5 g 200 mL/hr over 30 Minutes Intravenous To Short Stay 08/20/21 0713 08/22/21 0600   08/19/21 1000  cephALEXin (KEFLEX) capsule 500 mg        500 mg Oral Daily  08/18/21 2044          V. Leia Alf, M.D., [facility] [place] Office: [phone] Pager:  [phone]

## 2021-08-20 NOTE — Progress Notes (Signed)
Brief Progress Note  Pt down to IR for tunneled HD cath exchange.  He informs staff he is unable to lay flat and still for procedure.  He requests being premedicated with his "itching medicine" and having Bipap available prior and declines to have exchange done at this time.  Will reattempt as schedule allows.  Likely tomorrow.    Electronically Signed: Pasty Spillers, PA-C 08/20/2021, 8:53 AM

## 2021-08-20 NOTE — Plan of Care (Signed)
Problem: Education: °Goal: Knowledge of General Education information will improve °Description: Including pain rating scale, medication(s)/side effects and non-pharmacologic comfort measures °Outcome: Completed/Met °  °

## 2021-08-20 NOTE — H&P (View-Only) (Signed)
° ° °  Subjective  -  Awaiting tunneled catheter exchange   Physical Exam:  Palpable radial pulses Nonlabored breathing   Assessment/Plan:    End-stage renal disease: I discussed with the patient that we would plan on a left brachiocephalic fistula tomorrow.  I discussed the details of the procedure.  I also discussed that he will likely require a second operation for elevation of his fistula.  We talked about steal syndrome.  All of his questions were answered.  His Eliquis remains on hold.  He will be n.p.o. after midnight.  Wells Kade Rickels 08/20/2021 2:55 PM --  Vitals:   08/20/21 0924 08/20/21 1438  BP: 95/75 (!) 109/55  Pulse: 98 98  Resp: 18 18  Temp: 98.3 F (36.8 C) 98.2 F (36.8 C)  SpO2: 97% 100%    Intake/Output Summary (Last 24 hours) at 08/20/2021 1455 Last data filed at 08/20/2021 1152 Gross per 24 hour  Intake 1720 ml  Output 550 ml  Net 1170 ml     Laboratory CBC    Component Value Date/Time   WBC 7.8 08/20/2021 0235   HGB 9.0 (L) 08/20/2021 0235   HCT 27.4 (L) 08/20/2021 0235   PLT 256 08/20/2021 0235    BMET    Component Value Date/Time   NA 139 08/20/2021 0235   K 4.2 08/20/2021 0235   CL 95 (L) 08/20/2021 0235   CO2 22 08/20/2021 0235   GLUCOSE 102 (H) 08/20/2021 0235   BUN 69 (H) 08/20/2021 0235   CREATININE 11.41 (H) 08/20/2021 0235   CALCIUM 9.9 08/20/2021 0235   GFRNONAA 5 (L) 08/20/2021 0235   GFRAA >60 03/27/2020 1055    COAG Lab Results  Component Value Date   INR 1.5 (H) 08/19/2021   INR 1.3 (H) 04/23/2021   INR 1.3 (H) 03/23/2021   No results found for: PTT  Antibiotics Anti-infectives (From admission, onward)    Start     Dose/Rate Route Frequency Ordered Stop   08/21/21 0600  cefUROXime (ZINACEF) 1.5 g in sodium chloride 0.9 % 100 mL IVPB        1.5 g 200 mL/hr over 30 Minutes Intravenous To Short Stay 08/20/21 0713 08/22/21 0600   08/19/21 1000  cephALEXin (KEFLEX) capsule 500 mg        500 mg Oral Daily  08/18/21 2044          V. Leia Alf, M.D., [facility] [place] Office: [phone] Pager:  [phone]

## 2021-08-20 NOTE — Progress Notes (Signed)
Pt receives out-pt HD at [facility] on TTS. Pt arrives at 5:20 am for 5:40 chair time. Will assist as needed.   Melven Sartorius Renal Navigator [phone]

## 2021-08-20 NOTE — Progress Notes (Signed)
Pt off unit to IR

## 2021-08-20 NOTE — Procedures (Signed)
Vascular and Interventional Radiology Procedure Note  Patient: Phillip Young DOB: February 09, 1986 Medical Record Number: [id number] Note Date/Time: 08/20/21 2:03 PM   Performing Physician: Michaelle Birks, MD Assistant(s): None  Diagnosis: ESRD requiring Hemodialysis and malfunctioning HD catheter  Procedure: TUNNELED HEMODIALYSIS CATHETER EXCHANGE  Anesthesia: Local Anesthetic Complications: None Estimated Blood Loss: Minimal Specimens:  None  Findings:  Successful exchange for a  right-sided, 27 cm (tip-to-cuff), tunneled hemodialysis catheter with the tip of the catheter in the right atrium.  Plan: Catheter ready for use.  See detailed procedure note with images in PACS. The patient tolerated the procedure well without incident or complication and was returned to Floor Bed in stable condition.    Michaelle Birks, MD [facility]iology Specialists [facility] Radiology   Pager. [phone]

## 2021-08-20 NOTE — Progress Notes (Signed)
[facility] Progress Note   Subjective:  Seen in room. Refused catheter exchange in IR this am. States he can't lay flat w/o BiPAP  Will attempt HD today   Objective Vitals:   08/19/21 2129 08/19/21 2312 08/20/21 0455 08/20/21 0924  BP: 110/71  122/73 95/75  Pulse: 99 85 97 98  Resp: 18 19 20 18   Temp: 98.4 F (36.9 C)  98.2 F (36.8 C) 98.3 F (36.8 C)  TempSrc:      SpO2: 100% 95% 100% 97%  Weight:      Height:         Additional Objective Labs: Basic Metabolic Panel: Recent Labs  Lab 08/18/21 1132 08/18/21 1223 08/19/21 0540 08/20/21 0235  NA 135 136 133* 139  K 3.9 3.8 4.0 4.2  CL 99  --  96* 95*  CO2 21*  --  21* 22  GLUCOSE 94  --  94 102*  BUN 53*  --  62* 69*  CREATININE 9.91*  --  10.36* 11.41*  CALCIUM 9.5  --  9.4 9.9  PHOS  --   --  5.7*  --     CBC: Recent Labs  Lab 08/18/21 1132 08/18/21 1223 08/19/21 0540 08/20/21 0235  WBC 8.7  --  8.0 7.8  NEUTROABS 5.9  --   --   --   HGB 9.6* 10.2* 9.0* 9.0*  HCT 29.7* 30.0* 27.4* 27.4*  MCV 99.3  --  98.2 98.2  PLT 282  --  251 256    Blood Culture    Component Value Date/Time   SDES BLOOD LEFT HAND 04/19/2021 1137   SPECREQUEST  04/19/2021 1137    BOTTLES DRAWN AEROBIC AND ANAEROBIC Blood Culture adequate volume   CULT  04/19/2021 1137    NO GROWTH 5 DAYS Performed at [facility], [place] [address], [place], [place] [address]    REPTSTATUS 04/24/2021 FINAL 04/19/2021 1137     Physical Exam General: Large man, laying in bed, nad  Heart: RRR No m,r,g Lungs: Distant, no wheeze, rales  Abdomen: obese, soft, non-tender  Extremities: Trace LE edema  Dialysis Access: R IJ TDC   Medications:  sodium chloride     sodium chloride     anticoagulant sodium citrate     [START ON 08/21/2021] cefUROXime (ZINACEF)  IV      amiodarone  200 mg Oral Daily   amitriptyline  10 mg Oral QHS   benzonatate  100 mg Oral BID   cephALEXin  500 mg Oral Daily   Chlorhexidine  Gluconate Cloth  6 each Topical Daily   famotidine  20 mg Oral BID   lanthanum  500 mg Oral TID WC   midodrine  20 mg Oral TID WC    Dialysis Orders:  Unit: [facility]  Time: 4:00 EDW: 175 kg  Flows: 425  [place]ath: 4K/2.25 Ca Access: TDC  Heparin: None (contraindication) ESA: Mircera 30 q 2 weeks (last 1/12)  VDRA: Hectorol 2 TIW   Assessment/Plan: ESRD -  Presented with malfunctioning dialysis catheter. He was unable to dialyze at his center. Plan was for IR for cathter exchange -but he refused today.  Also needs fistula. Has missed outpatient appointments with [facility]. He's agreeable for AVF now -Have consulted [facility] for perm access (For R AVF on 1/27).  Appreciate assistance from IR /Vascular teams.  --We will attempt dialysis today via [facility]  Volume overload - Missed dialysis and was 12 kg over dry weight on 1/24. Plan max  UF with HD. Plan for serial treatments.  Hypertension - BP acceptable. Midodrine for BP support on HD  Anemia  - Hgb 9.6 On ESA. Next due 1/26. Will order  Metabolic bone disease -  Calcium ok. Phos 5.7. Continue home binders.  AFib- on Eliquis  OSA - continue CPAP  HFrEF - Try to optimize volume with dialysis   Lynnda Child PA-C [facility] 08/20/2021,9:46 AM

## 2021-08-20 NOTE — Anesthesia Preprocedure Evaluation (Addendum)
Anesthesia Evaluation  Patient identified by MRN, date of birth, ID band Patient awake    Reviewed: Allergy & Precautions, NPO status , Patient's Chart, lab work & pertinent test results  History of Anesthesia Complications Negative for: history of anesthetic complications  Airway Mallampati: III  TM Distance: >3 FB Neck ROM: Full    Dental  (+) Dental Advisory Given, Poor Dentition   Pulmonary sleep apnea , former smoker,     + decreased breath sounds      Cardiovascular hypertension, Pt. on medications +CHF  + dysrhythmias Atrial Fibrillation  Rhythm:Regular Rate:Tachycardia  TTE 2022 1. Left ventricular ejection fraction, by estimation, is 25 to 30%. The  left ventricle has severely decreased function. The left ventricle  demonstrates global hypokinesis. The left ventricular internal cavity size  was moderately dilated. Left  ventricular diastolic parameters are indeterminate.  2. Right ventricular systolic function is normal. The right ventricular  size is normal. There is moderately elevated pulmonary artery systolic  pressure. The estimated right ventricular systolic pressure is 80.9 mmHg.  3. The mitral valve is normal in structure. Trivial mitral valve  regurgitation. No evidence of mitral stenosis.  4. The aortic valve is normal in structure. Aortic valve regurgitation is  not visualized. No aortic stenosis is present.  5. The inferior vena cava is normal in size with greater than 50%  respiratory variability, suggesting right atrial pressure of 3 mmHg.    Neuro/Psych negative neurological ROS  negative psych ROS   GI/Hepatic Neg liver ROS, GERD  ,  Endo/Other  Morbid obesity (BMI 68)  Renal/GU ESRF and DialysisRenal diseaseLab Results      Component                Value               Date                      CREATININE               4.35 (H)            05/04/2021                BUN                       31 (H)              05/04/2021                NA                       130 (L)             05/04/2021                K                        4.2                 05/04/2021                CL                       95 (L)              05/04/2021                CO2  26                  05/04/2021             negative genitourinary   Musculoskeletal negative musculoskeletal ROS (+)   Abdominal   Peds  Hematology  (+) Blood dyscrasia (on eliquis), anemia , Lab Results      Component                Value               Date                      WBC                      8.5                 05/04/2021                HGB                      10.6 (L)            05/04/2021                HCT                      33.5 (L)            05/04/2021                MCV                      96.5                05/04/2021                PLT                      406 (H)             05/04/2021              Anesthesia Other Findings   Reproductive/Obstetrics                           Anesthesia Physical  Anesthesia Plan  ASA: 4  Anesthesia Plan: General   Post-op Pain Management:    Induction:   PONV Risk Score and Plan: 2 and Ondansetron and Treatment may vary due to age or medical condition  Airway Management Planned: LMA  Additional Equipment:   Intra-op Plan:   Post-operative Plan: Extubation in OR  Informed Consent: I have reviewed the patients History and Physical, chart, labs and discussed the procedure including the risks, benefits and alternatives for the proposed anesthesia with the patient or authorized representative who has indicated his/her understanding and acceptance.     Dental advisory given  Plan Discussed with: Anesthesiologist, CRNA and Surgeon  Anesthesia Plan Comments:        Anesthesia Quick Evaluation

## 2021-08-20 NOTE — Progress Notes (Addendum)
Plan for right UE AV fistula verses graft for HD permanent access.   +-----------------+-------------+----------+--------+   Right Cephalic    Diameter (cm) Depth (cm) Findings   +-----------------+-------------+----------+--------+   Shoulder              0.51                            +-----------------+-------------+----------+--------+   Prox upper arm        0.51                            +-----------------+-------------+----------+--------+   Mid upper arm         0.48                            +-----------------+-------------+----------+--------+   Dist upper arm        0.48                            +-----------------+-------------+----------+--------+   Antecubital fossa     0.59                            +-----------------+-------------+----------+--------+   Prox forearm          0.38                            +-----------------+-------------+----------+--------+   Mid forearm           0.29                            +-----------------+-------------+----------+--------+   Dist forearm          0.20                            +-----------------+-------------+----------+--------+   +-----------------+-------------+----------+---------+   Right Basilic     Diameter (cm) Depth (cm) Findings    +-----------------+-------------+----------+---------+   Shoulder              0.56                             +-----------------+-------------+----------+---------+   Prox upper arm        0.59                             +-----------------+-------------+----------+---------+   Mid upper arm         0.63                             +-----------------+-------------+----------+---------+   Dist upper arm        0.47                             +-----------------+-------------+----------+---------+   Antecubital fossa     0.45                             +-----------------+-------------+----------+---------+   Prox forearm  0.24                 branching    +-----------------+-------------+----------+---------+   Mid forearm           0.22                             +-----------------+-------------+----------+---------+   Distal forearm        0.25                             +-----------------+-------------+----------+---------+   Wrist                 0.22                             +-----------------+-------------+----------+---------+   A/P ESRD Plan for Left UE AV fistula verses graft 08/21/21 with Dr. Trula Slade He has an acceptable Cephalic and basilic on the right UE according to vein mapping.  He is right hand dominant and the left UE vein mapping was reviewed by Dr. Trula Slade.  All questions and concerns were discussed yesterday. NPO past MN Eliquis for Afib has been held.  Roxy Horseman PA-C

## 2021-08-20 NOTE — Progress Notes (Signed)
HD#0 SUBJECTIVE:  Patient Summary: Phillip Young is a 36 y.o. male with a pertinent PMH of HTN, T2DM, paroxysmal fibrillation on Elqiuis, chronic HFrEF (EF 20%), OSA on CPAP, and ESRD on HD TTS admitted for HD cath replacement.   Overnight Events: none  Interim History: Patient states that he continues to have shortness of breath and diffuse pruritis. We discussed his need for a the procedure today and subsequent dialysis. Otherwise, he denies any news symptoms.  OBJECTIVE:  Vital Signs: Vitals:   08/19/21 2129 08/19/21 2312 08/20/21 0455 08/20/21 0924  BP: 110/71  122/73 95/75  Pulse: 99 85 97 98  Resp: 18 19 20 18   Temp: 98.4 F (36.9 C)  98.2 F (36.8 C) 98.3 F (36.8 C)  TempSrc:      SpO2: 100% 95% 100% 97%  Weight:      Height:       Supplemental O2: Nasal Cannula SpO2: 97 % O2 Flow Rate (L/min): 4 L/min  Filed Weights   08/18/21 1520  Weight: (!) 183.7 kg     Intake/Output Summary (Last 24 hours) at 08/20/2021 1405 Last data filed at 08/20/2021 1152 Gross per 24 hour  Intake 1720 ml  Output 550 ml  Net 1170 ml   Net IO Since Admission: 1,160 mL [08/20/21 1405]  Physical Exam: Physical Exam Constitutional:      Appearance: He is obese.  HENT:     Head: Normocephalic and atraumatic.  Eyes:     Extraocular Movements: Extraocular movements intact.  Cardiovascular:     Rate and Rhythm: Normal rate.     Pulses: Normal pulses.  Pulmonary:     Effort: No respiratory distress.     Comments: Distant breath sounds Abdominal:     General: There is no distension.     Tenderness: There is no abdominal tenderness.  Musculoskeletal:        General: Swelling present. Normal range of motion.  Skin:    General: Skin is warm and dry.  Neurological:     General: No focal deficit present.     Mental Status: He is alert.    Patient Lines/Drains/Airways Status     Active Line/Drains/Airways     Name Placement date Placement time Site Days   Peripheral IV  08/18/21 20 G Anterior;Right Forearm 08/18/21  1115  Forearm  2   Hemodialysis Catheter Right Internal jugular Double lumen Permanent (Tunneled) 08/20/21  1324  Internal jugular  less than 1            Pertinent Labs: CBC Latest Ref Rng & Units 08/20/2021 08/19/2021 08/18/2021  WBC 4.0 - 10.5 K/uL 7.8 8.0 -  Hemoglobin 13.0 - 17.0 g/dL 9.0(L) 9.0(L) 10.2(L)  Hematocrit 39.0 - 52.0 % 27.4(L) 27.4(L) 30.0(L)  Platelets 150 - 400 K/uL 256 251 -    CMP Latest Ref Rng & Units 08/20/2021 08/19/2021 08/18/2021  Glucose 70 - 99 mg/dL 102(H) 94 -  BUN 6 - 20 mg/dL 69(H) 62(H) -  Creatinine 0.61 - 1.24 mg/dL 11.41(H) 10.36(H) -  Sodium 135 - 145 mmol/L 139 133(L) 136  Potassium 3.5 - 5.1 mmol/L 4.2 4.0 3.8  Chloride 98 - 111 mmol/L 95(L) 96(L) -  CO2 22 - 32 mmol/L 22 21(L) -  Calcium 8.9 - 10.3 mg/dL 9.9 9.4 -  Total Protein 6.5 - 8.1 g/dL 7.2 - -  Total Bilirubin 0.3 - 1.2 mg/dL 1.7(H) - -  Alkaline Phos 38 - 126 U/L 100 - -  AST 15 -  41 U/L 197(H) - -  ALT 0 - 44 U/L 260(H) - -    No results for input(s): GLUCAP in the last 72 hours.   Pertinent Imaging: US Abdomen Limited RUQ (LIVER/GB)  Result Date: 08/20/2021 CLINICAL DATA:  Increased LFTs. EXAM: ULTRASOUND ABDOMEN LIMITED RIGHT UPPER QUADRANT COMPARISON:  CT abdomen and pelvis 02/19/2020. FINDINGS: Gallbladder: No gallstones or wall thickening visualized. No sonographic Murphy sign noted by sonographer. Common bile duct: Diameter: 3.6 mm. Liver: No focal lesion identified. Within normal limits in parenchymal echogenicity. Portal vein is patent on color Doppler imaging with normal direction of blood flow towards the liver. Other: None. IMPRESSION: Normal right upper quadrant ultrasound. Electronically Signed   By: Ronney Asters M.D.   On: 08/20/2021 01:16    ASSESSMENT/PLAN:  Assessment: Principal Problem:   Volume overload Active Problems:   Acute on chronic systolic (congestive) heart failure ([name])   Morbid obesity ([name])   OSA  (obstructive sleep apnea)   Chronic cough   ESRD (end stage renal disease) ([name])   Paroxysmal atrial flutter ([name])   MSSA bacteremia   Plan: ESRD on HD TTS  Malfunction HD catheter Tunnel catheter today and permeant access on Friday.  -Appreciate nephrology and VSS recommendation -Strict ins and outs -Daily renal function with electrolytes - NPO at midnight tonight.    Chronic HFrEF Nonischemic cardiomyopathy Patient remains hypervolemic and stable SHOB. -Continue midodrine -We will need HD today   Paroxysmal Afib -Continue amiodarone - Hold eliquis for procedure tomorrow  Elevated LFT Likely obstructive hepatopathy in the setting of significant hypervolemia and heart failure. -Trend CMP   Anemia of chronic disease -On ESA   MSSA septic arthritis of left sacroiliac joint and sacral osteomyelitis (03/2021) Hx of MRSA infection (2018) - Continue Keflex  - Follow up with ID on 08/26/21.   OSA -CPAP at night   Type 2 diabetes A1c of 6.5 in July.  Best Practice: Diet: Renal diet IVF: none VTE:  Code: Full AB: Keflex until 08/26/2021 Therapy Recs: Pending, DME: none Family Contact: none DISPO: Anticipated discharge in 2-3 days to  TBA  pending Medical stability.  Signature: Lawerance Cruel, D.O.  Internal Medicine Resident, PGY-3 [facility]  Pager: [phone] 2:05 PM, 08/20/2021   Please contact the on call pager after 5 pm and on weekends at [phone].

## 2021-08-21 ENCOUNTER — Encounter (HOSPITAL_COMMUNITY): Payer: Self-pay | Admitting: Internal Medicine

## 2021-08-21 ENCOUNTER — Inpatient Hospital Stay (HOSPITAL_COMMUNITY): Payer: BC Managed Care – PPO | Admitting: Anesthesiology

## 2021-08-21 ENCOUNTER — Other Ambulatory Visit: Payer: Self-pay

## 2021-08-21 ENCOUNTER — Encounter (HOSPITAL_COMMUNITY)
Admission: EM | Disposition: A | Discharge status: Home / Self Care | Payer: Self-pay | Source: Home / Self Care | Attending: Internal Medicine

## 2021-08-21 DIAGNOSIS — E8770 Fluid overload, unspecified: Secondary | ICD-10-CM | POA: Diagnosis not present

## 2021-08-21 DIAGNOSIS — N186 End stage renal disease: Secondary | ICD-10-CM

## 2021-08-21 DIAGNOSIS — Z992 Dependence on renal dialysis: Secondary | ICD-10-CM | POA: Diagnosis not present

## 2021-08-21 HISTORY — PX: AV FISTULA PLACEMENT: SHX1204

## 2021-08-21 LAB — COMPREHENSIVE METABOLIC PANEL
ALT: 362 U/L — ABNORMAL HIGH (ref 0–44)
AST: 297 U/L — ABNORMAL HIGH (ref 15–41)
Albumin: 3.4 g/dL — ABNORMAL LOW (ref 3.5–5.0)
Alkaline Phosphatase: 106 U/L (ref 38–126)
Anion gap: 13 (ref 5–15)
BUN: 47 mg/dL — ABNORMAL HIGH (ref 6–20)
CO2: 23 mmol/L (ref 22–32)
Calcium: 8.7 mg/dL — ABNORMAL LOW (ref 8.9–10.3)
Chloride: 99 mmol/L (ref 98–111)
Creatinine, Ser: 8.93 mg/dL — ABNORMAL HIGH (ref 0.61–1.24)
GFR, Estimated: 7 mL/min — ABNORMAL LOW (ref >60)
Glucose, Bld: 98 mg/dL (ref 70–99)
Potassium: 3.4 mmol/L — ABNORMAL LOW (ref 3.5–5.1)
Sodium: 135 mmol/L (ref 135–145)
Total Bilirubin: 1.6 mg/dL — ABNORMAL HIGH (ref 0.3–1.2)
Total Protein: 7.4 g/dL (ref 6.5–8.1)

## 2021-08-21 LAB — CBC
HCT: 29.4 % — ABNORMAL LOW (ref 39.0–52.0)
Hemoglobin: 9.4 g/dL — ABNORMAL LOW (ref 13.0–17.0)
MCH: 32 pg (ref 26.0–34.0)
MCHC: 32 g/dL (ref 30.0–36.0)
MCV: 100 fL (ref 80.0–100.0)
Platelets: 225 10*3/uL (ref 150–400)
RBC: 2.94 MIL/uL — ABNORMAL LOW (ref 4.22–5.81)
RDW: 19.1 % — ABNORMAL HIGH (ref 11.5–15.5)
WBC: 7.2 10*3/uL (ref 4.0–10.5)
nRBC: 0 % (ref 0.0–0.2)

## 2021-08-21 LAB — IRON AND TIBC
Iron: 48 ug/dL (ref 45–182)
Saturation Ratios: 11 % — ABNORMAL LOW (ref 17.9–39.5)
TIBC: 451 ug/dL — ABNORMAL HIGH (ref 250–450)
UIBC: 403 ug/dL

## 2021-08-21 LAB — GLUCOSE, CAPILLARY: Glucose-Capillary: 104 mg/dL — ABNORMAL HIGH (ref 70–99)

## 2021-08-21 LAB — FERRITIN: Ferritin: 98 ng/mL (ref 24–336)

## 2021-08-21 LAB — TSH: TSH: 3.217 u[IU]/mL (ref 0.350–4.500)

## 2021-08-21 SURGERY — ARTERIOVENOUS (AV) FISTULA CREATION
Anesthesia: General | Site: Arm Lower | Laterality: Left

## 2021-08-21 MED ORDER — SUCCINYLCHOLINE 20MG/ML (10ML) SYRINGE FOR MEDFUSION PUMP - OPTIME
INTRAMUSCULAR | Status: DC | PRN
  Administered 2021-08-21: 120 mg via INTRAVENOUS

## 2021-08-21 MED ORDER — FENTANYL CITRATE (PF) 250 MCG/5ML IJ SOLN
INTRAMUSCULAR | Status: AC
  Filled 2021-08-21: qty 5

## 2021-08-21 MED ORDER — LIDOCAINE HCL (PF) 1 % IJ SOLN
INTRAMUSCULAR | Status: AC
  Filled 2021-08-21: qty 30

## 2021-08-21 MED ORDER — CHLORHEXIDINE GLUCONATE 0.12 % MT SOLN
OROMUCOSAL | Status: AC
  Administered 2021-08-21: 15 mL via OROMUCOSAL
  Filled 2021-08-21: qty 15

## 2021-08-21 MED ORDER — PROPOFOL 10 MG/ML IV BOLUS
INTRAVENOUS | Status: DC | PRN
  Administered 2021-08-21: 150 mg via INTRAVENOUS

## 2021-08-21 MED ORDER — SODIUM CHLORIDE 0.9 % IV SOLN: 100.0000 mL | INTRAVENOUS | Status: DC | PRN

## 2021-08-21 MED ORDER — FENTANYL CITRATE (PF) 100 MCG/2ML IJ SOLN
INTRAMUSCULAR | Status: DC | PRN
  Administered 2021-08-21: 50 ug via INTRAVENOUS

## 2021-08-21 MED ORDER — ONDANSETRON HCL 4 MG/2ML IJ SOLN
INTRAMUSCULAR | Status: DC | PRN
  Administered 2021-08-21: 4 mg via INTRAVENOUS

## 2021-08-21 MED ORDER — ETOMIDATE 2 MG/ML IV SOLN
INTRAVENOUS | Status: DC | PRN
  Administered 2021-08-21: 16 mg via INTRAVENOUS

## 2021-08-21 MED ORDER — DEXAMETHASONE SODIUM PHOSPHATE 10 MG/ML IJ SOLN
INTRAMUSCULAR | Status: DC | PRN
  Administered 2021-08-21: 10 mg via INTRAVENOUS

## 2021-08-21 MED ORDER — LIDOCAINE-PRILOCAINE 2.5-2.5 % EX CREA: 1.0000 "application " | TOPICAL_CREAM | CUTANEOUS | Status: DC | PRN

## 2021-08-21 MED ORDER — SODIUM CHLORIDE 0.9 % IV SOLN: INTRAVENOUS | Status: DC

## 2021-08-21 MED ORDER — LIDOCAINE HCL (PF) 1 % IJ SOLN: 5.0000 mL | INTRAMUSCULAR | Status: DC | PRN

## 2021-08-21 MED ORDER — ESMOLOL HCL 100 MG/10ML IV SOLN
INTRAVENOUS | Status: AC
  Filled 2021-08-21: qty 20

## 2021-08-21 MED ORDER — ACETAMINOPHEN 500 MG PO TABS: 1000.0000 mg | ORAL_TABLET | Freq: Once | ORAL | Status: DC

## 2021-08-21 MED ORDER — ORAL CARE MOUTH RINSE: 15.0000 mL | Freq: Once | OROMUCOSAL | Status: AC

## 2021-08-21 MED ORDER — ALBUMIN HUMAN 5 % IV SOLN
INTRAVENOUS | Status: DC | PRN
Start: 2021-08-21 — End: 2021-08-21

## 2021-08-21 MED ORDER — APIXABAN 5 MG PO TABS
5.0000 mg | ORAL_TABLET | Freq: Two times a day (BID) | ORAL | Status: DC
  Administered 2021-08-22: 5 mg via ORAL
  Filled 2021-08-21: qty 1

## 2021-08-21 MED ORDER — PENTAFLUOROPROP-TETRAFLUOROETH EX AERO: 1.0000 "application " | INHALATION_SPRAY | CUTANEOUS | Status: DC | PRN

## 2021-08-21 MED ORDER — PHENYLEPHRINE 40 MCG/ML (10ML) SYRINGE FOR IV PUSH (FOR BLOOD PRESSURE SUPPORT)
PREFILLED_SYRINGE | INTRAVENOUS | Status: DC | PRN
Start: 2021-08-21 — End: 2021-08-21
  Administered 2021-08-21: 60 ug via INTRAVENOUS
  Administered 2021-08-21: 30 ug via INTRAVENOUS

## 2021-08-21 MED ORDER — 0.9 % SODIUM CHLORIDE (POUR BTL) OPTIME
TOPICAL | Status: DC | PRN
  Administered 2021-08-21: 1000 mL

## 2021-08-21 MED ORDER — OXYCODONE HCL 5 MG PO TABS
5.0000 mg | ORAL_TABLET | Freq: Once | ORAL | Status: AC
  Administered 2021-08-21: 5 mg via ORAL
  Filled 2021-08-21: qty 1

## 2021-08-21 MED ORDER — LIDOCAINE 2% (20 MG/ML) 5 ML SYRINGE
INTRAMUSCULAR | Status: DC | PRN
Start: 2021-08-21 — End: 2021-08-21
  Administered 2021-08-21: 100 mg via INTRAVENOUS

## 2021-08-21 MED ORDER — PROMETHAZINE HCL 25 MG/ML IJ SOLN: 6.2500 mg | INTRAMUSCULAR | Status: DC | PRN

## 2021-08-21 MED ORDER — PHENYLEPHRINE HCL-NACL 20-0.9 MG/250ML-% IV SOLN
INTRAVENOUS | Status: DC | PRN
  Administered 2021-08-21: 80 ug/min via INTRAVENOUS

## 2021-08-21 MED ORDER — ACETAMINOPHEN 500 MG PO TABS
ORAL_TABLET | ORAL | Status: AC
  Filled 2021-08-21: qty 2

## 2021-08-21 MED ORDER — ALTEPLASE 2 MG IJ SOLR: 2.0000 mg | Freq: Once | INTRAMUSCULAR | Status: DC | PRN

## 2021-08-21 MED ORDER — CHLORHEXIDINE GLUCONATE 0.12 % MT SOLN: 15.0000 mL | Freq: Once | OROMUCOSAL | Status: AC

## 2021-08-21 MED ORDER — ESMOLOL HCL 100 MG/10ML IV SOLN
INTRAVENOUS | Status: DC | PRN
  Administered 2021-08-21 (×2): 30 mg via INTRAVENOUS
  Administered 2021-08-21: 20 mg via INTRAVENOUS
  Administered 2021-08-21: 50 mg via INTRAVENOUS

## 2021-08-21 MED ORDER — FENTANYL CITRATE (PF) 100 MCG/2ML IJ SOLN: 25.0000 ug | INTRAMUSCULAR | Status: DC | PRN

## 2021-08-21 MED ORDER — LIDOCAINE-EPINEPHRINE (PF) 1 %-1:200000 IJ SOLN
INTRAMUSCULAR | Status: AC
  Filled 2021-08-21: qty 30

## 2021-08-21 MED ORDER — LIDOCAINE-EPINEPHRINE (PF) 1 %-1:200000 IJ SOLN
INTRAMUSCULAR | Status: DC | PRN
  Administered 2021-08-21: 2 mL

## 2021-08-21 MED ORDER — OXYCODONE-ACETAMINOPHEN 5-325 MG PO TABS
1.0000 | ORAL_TABLET | Freq: Four times a day (QID) | ORAL | Status: DC | PRN
  Administered 2021-08-21 – 2021-08-22 (×4): 1 via ORAL
  Filled 2021-08-21 (×4): qty 1

## 2021-08-21 SURGICAL SUPPLY — 34 items
ARMBAND PINK RESTRICT EXTREMIT (MISCELLANEOUS) ×4 IMPLANT
BAG COUNTER SPONGE SURGICOUNT (BAG) ×2 IMPLANT
CANISTER SUCT 3000ML PPV (MISCELLANEOUS) ×2 IMPLANT
CLIP VESOCCLUDE MED 6/CT (CLIP) ×2 IMPLANT
CLIP VESOCCLUDE SM WIDE 6/CT (CLIP) ×2 IMPLANT
COVER PROBE W GEL 5X96 (DRAPES) ×2 IMPLANT
DERMABOND ADVANCED (GAUZE/BANDAGES/DRESSINGS) ×1
DERMABOND ADVANCED .7 DNX12 (GAUZE/BANDAGES/DRESSINGS) ×1 IMPLANT
ELECT REM PT RETURN 9FT ADLT (ELECTROSURGICAL) ×2
ELECTRODE REM PT RTRN 9FT ADLT (ELECTROSURGICAL) ×1 IMPLANT
GLOVE SRG 8 PF TXTR STRL LF DI (GLOVE) ×1 IMPLANT
GLOVE SURG POLYISO LF SZ6.5 (GLOVE) ×1 IMPLANT
GLOVE SURG POLYISO LF SZ7.5 (GLOVE) ×2 IMPLANT
GLOVE SURG UNDER POLY LF SZ8 (GLOVE) ×1
GOWN STRL REUS W/ TWL LRG LVL3 (GOWN DISPOSABLE) ×2 IMPLANT
GOWN STRL REUS W/ TWL XL LVL3 (GOWN DISPOSABLE) ×1 IMPLANT
GOWN STRL REUS W/TWL LRG LVL3 (GOWN DISPOSABLE) ×2
GOWN STRL REUS W/TWL XL LVL3 (GOWN DISPOSABLE) ×1
HEMOSTAT SNOW SURGICEL 2X4 (HEMOSTASIS) IMPLANT
KIT BASIN OR (CUSTOM PROCEDURE TRAY) ×2 IMPLANT
KIT TURNOVER KIT B (KITS) ×2 IMPLANT
NS IRRIG 1000ML POUR BTL (IV SOLUTION) ×2 IMPLANT
OCCLUDER DISP VAS  1.75 (MISCELLANEOUS) ×1
OCCLUDER DISP VAS 1.75 (MISCELLANEOUS) IMPLANT
PACK CV ACCESS (CUSTOM PROCEDURE TRAY) ×2 IMPLANT
PAD ARMBOARD 7.5X6 YLW CONV (MISCELLANEOUS) ×4 IMPLANT
SUT PROLENE 6 0 BV (SUTURE) ×2 IMPLANT
SUT PROLENE 6 0 CC (SUTURE) ×2 IMPLANT
SUT VIC AB 3-0 SH 27 (SUTURE) ×1
SUT VIC AB 3-0 SH 27X BRD (SUTURE) ×1 IMPLANT
SUT VICRYL 4-0 PS2 18IN ABS (SUTURE) ×2 IMPLANT
TOWEL GREEN STERILE (TOWEL DISPOSABLE) ×2 IMPLANT
UNDERPAD 30X36 HEAVY ABSORB (UNDERPADS AND DIAPERS) ×2 IMPLANT
WATER STERILE IRR 1000ML POUR (IV SOLUTION) ×2 IMPLANT

## 2021-08-21 NOTE — Discharge Instructions (Signed)
° °  [facility]  Discharge Instructions  AV Fistula or Graft Surgery for Dialysis Access  Please refer to the following instructions for your post-procedure care. Your surgeon or physician assistant will discuss any changes with you.  Activity  You may drive the day following your surgery, if you are comfortable and no longer taking prescription pain medication. Resume full activity as the soreness in your incision resolves.  Bathing/Showering  You may shower after you go home. Keep your incision dry for 48 hours. Do not soak in a bathtub, hot tub, or swim until the incision heals completely. You may not shower if you have a hemodialysis catheter.  Incision Care  Clean your incision with mild soap and water after 48 hours. Pat the area dry with a clean towel. You do not need a bandage unless otherwise instructed. Do not apply any ointments or creams to your incision. You may have skin glue on your incision. Do not peel it off. It will come off on its own in about one week. Your arm may swell a bit after surgery. To reduce swelling use pillows to elevate your arm so it is above your heart. Your doctor will tell you if you need to lightly wrap your arm with an ACE bandage.  Diet  Resume your normal diet. There are not special food restrictions following this procedure. In order to heal from your surgery, it is CRITICAL to get adequate nutrition. Your body requires vitamins, minerals, and protein. Vegetables are the best source of vitamins and minerals. Vegetables also provide the perfect balance of protein. Processed food has little nutritional value, so try to avoid this.  Medications  Resume taking all of your medications. If your incision is causing pain, you may take over-the counter pain relievers such as acetaminophen (Tylenol). If you were prescribed a stronger pain medication, please be aware these medications can cause nausea and constipation. Prevent  nausea by taking the medication with a snack or meal. Avoid constipation by drinking plenty of fluids and eating foods with high amount of fiber, such as fruits, vegetables, and grains.  Do not take Tylenol if you are taking prescription pain medications.  Follow up Your surgeon may want to see you in the office following your access surgery. If so, this will be arranged at the time of your surgery.  Please call us immediately for any of the following conditions:  Increased pain, redness, drainage (pus) from your incision site Fever of 101 degrees or higher Severe or worsening pain at your incision site Hand pain or numbness.  Reduce your risk of vascular disease:  Stop smoking. If you would like help, call [facility] at 1-800-QUIT-NOW 312-311-5699) or [place] at [place] your cholesterol Maintain a desired weight Control your diabetes Keep your blood pressure down  Dialysis  It will take several weeks to several months for your new dialysis access to be ready for use. Your surgeon will determine when it is okay to use it. Your nephrologist will continue to direct your dialysis. You can continue to use your Permcath until your new access is ready for use.   08/21/2021 Phillip Young [id number] Jul 02, 1986  Surgeon(s): Serafina Mitchell, MD  Procedure(s): Creation left arm brachiocephalic AV fistula  x Do not stick fistula for 12 weeks    If you have any questions, please call the office at [phone].

## 2021-08-21 NOTE — Op Note (Signed)
° ° °  Patient name: Phillip Young MRN: [id number] DOB: Feb 27, 1986 Sex: male  08/21/2021 Pre-operative Diagnosis: End-stage renal disease Post-operative diagnosis:  Same Surgeon:  Annamarie Major Assistants:  Sabino Dick, MS III Procedure:   Left brachiocephalic fistula Anesthesia: General Blood Loss: Minimal Specimens: None  Findings: 3 mm disease-free brachial artery.  4 mm cephalic vein.  Because of the patient's body habitus, as well as biceps muscle, the vein takes a rather sharp deep turn to the artery.  Indications: This is a 36 year old gentleman in need of permanent dialysis access.  He comes in today for left brachiocephalic fistula  Procedure:  The patient was identified in the holding area and taken to [place] [facility]  The patient was then placed supine on the table. general anesthesia was administered.  The patient was prepped and draped in the usual sterile fashion.  A time out was called and antibiotics were administered.  Ultrasound was used to identify the cephalic vein throughout the upper arm.  It was of adequate diameter for fistula creation.  1% lidocaine was used for local anesthesia.  A transverse incision was made just proximal to the antecubital crease.  I first dissected out the brachial artery which was a 3 mm disease-free artery.  I then dissected out the cephalic vein.  This was a healthy appearing 4 mm vein.  It was fully mobilized and then marked for orientation.  It was then ligated distally.  I flushed it with heparin saline.  It dilated nicely.  The patient has a heparin allergy and so no heparin was given.  The brachial artery was then occluded.  It was opened with a #11 blade and extended longitudinally with Potts scissors.  The vein was then cut the appropriate length and spatulated but the size the arteriotomy.  A running anastomosis was created with 6-0 Prolene.  Prior to completion, the appropriate flushing maneuvers were performed and the anastomosis was completed.   The patient had a good thrill within the fistula and a Doppler signal in the radial artery that did not change in intensity with compression of the fistula.  I then inspected the course of the vein.  It was without kinks.  I did divide some of the muscle so that the vein sat nicely within the tissue.  The wound was then irrigated.  Hemostasis was achieved.  The subcutaneous tissue was reapproximated with 3-0 Vicryl, and the skin was closed with 4-0 Vicryl followed by Dermabond.   Disposition: To PACU stable.   [name], M.D., [facility] [place] Office: [phone] Pager:  [phone]

## 2021-08-21 NOTE — Progress Notes (Signed)
Pt off unit to or ?

## 2021-08-21 NOTE — Progress Notes (Signed)
No report received from 32M nurse.

## 2021-08-21 NOTE — Interval H&P Note (Signed)
History and Physical Interval Note:  08/21/2021 10:01 AM  [name]  has presented today for surgery, with the diagnosis of ESRD.  The various methods of treatment have been discussed with the patient and family. After consideration of risks, benefits and other options for treatment, the patient has consented to  Procedure(s): LEFT ARM ARTERIOVENOUS (AV) FISTULA VERSUS GRAFT CREATION (Left) as a surgical intervention.  The patient's history has been reviewed, patient examined, no change in status, stable for surgery.  I have reviewed the patient's chart and labs.  Questions were answered to the patient's satisfaction.     Phillip Young

## 2021-08-21 NOTE — Progress Notes (Addendum)
HD#1 SUBJECTIVE:  Patient Summary: Phillip Young is a 36 y.o. male with a pertinent PMH of HTN, T2DM, paroxysmal fibrillation on Elqiuis, chronic HFrEF (EF 20%), OSA on CPAP, and ESRD on HD TTS admitted for HD cath replacement.  Overnight Events: none  Interim History: Patient standing comfortably in room. He states that he has some residual arm pain from his surgery today. Otherwise he states that his [facility] and swelling has improved. He denies any additional symptoms at this time. We discussed continuing HD tomorrow on his regular schedule.   OBJECTIVE:  Vital Signs: Vitals:   08/20/21 2238 08/20/21 2300 08/21/21 0054 08/21/21 0458  BP: 110/85   (!) 112/55  Pulse: (!) 101  96 100  Resp: (!) 24 20 20  (!) 22  Temp: 97.8 F (36.6 C)   98 F (36.7 C)  TempSrc: Oral   Oral  SpO2: 100% 100%  100%  Weight:      Height:       Supplemental O2: Room Air SpO2: 100 % O2 Flow Rate (L/min): 4 L/min  Filed Weights   08/18/21 1520 08/20/21 1810  Weight: (!) 183.7 kg (!) 183.7 kg     Intake/Output Summary (Last 24 hours) at 08/21/2021 0656 Last data filed at 08/20/2021 2345 Gross per 24 hour  Intake 1644.2 ml  Output 3800 ml  Net -2155.8 ml   Net IO Since Admission: -1,375.8 mL [08/21/21 0656]  Physical Exam: Physical Exam Constitutional:      General: He is not in acute distress.    Appearance: He is not ill-appearing.  HENT:     Head: Normocephalic and atraumatic.  Eyes:     General: No scleral icterus. Cardiovascular:     Rate and Rhythm: Tachycardia present.     Pulses: Normal pulses.     Heart sounds: No murmur heard. Pulmonary:     Effort: Pulmonary effort is normal. No respiratory distress.     Comments: Distant breath sounds Abdominal:     General: There is distension.     Palpations: Abdomen is soft.     Tenderness: There is no abdominal tenderness.  Musculoskeletal:        General: Swelling (improved lower extreity edema) present.     Cervical back: Normal  range of motion. No rigidity.  Skin:    General: Skin is warm and dry.     Coloration: Skin is not jaundiced.  Neurological:     General: No focal deficit present.     Mental Status: He is alert.     Motor: No weakness.    Patient Lines/Drains/Airways Status     Active Line/Drains/Airways     Name Placement date Placement time Site Days   Hemodialysis Catheter Right Internal jugular Double lumen Permanent (Tunneled) 08/20/21  1324  Internal jugular  1            Pertinent Labs: CBC Latest Ref Rng & Units 08/21/2021 08/20/2021 08/19/2021  WBC 4.0 - 10.5 K/uL 7.2 7.8 8.0  Hemoglobin 13.0 - 17.0 g/dL 08/21/2021) 9.3(O) 6.7(T)  Hematocrit 39.0 - 52.0 % 29.4(L) 27.4(L) 27.4(L)  Platelets 150 - 400 K/uL 225 256 251    CMP Latest Ref Rng & Units 08/21/2021 08/20/2021 08/19/2021  Glucose 70 - 99 mg/dL 98 08/21/2021) 94  BUN 6 - 20 mg/dL 809(X) 83(J) 82(N)  Creatinine 0.61 - 1.24 mg/dL 05(L) 9.76(B) 34.19(F)  Sodium 135 - 145 mmol/L 135 139 133(L)  Potassium 3.5 - 5.1 mmol/L 3.4(L) 4.2 4.0  Chloride 98 -  111 mmol/L 99 95(L) 96(L)  CO2 22 - 32 mmol/L 23 22 21(L)  Calcium 8.9 - 10.3 mg/dL 8.7(L) 9.9 9.4  Total Protein 6.5 - 8.1 g/dL 7.4 7.2 -  Total Bilirubin 0.3 - 1.2 mg/dL 1.6(H) 1.7(H) -  Alkaline Phos 38 - 126 U/L 106 100 -  AST 15 - 41 U/L 297(H) 197(H) -  ALT 0 - 44 U/L 362(H) 260(H) -    No results for input(s): GLUCAP in the last 72 hours.   Pertinent Imaging: IR Fluoro Guide CV Line Right  Result Date: 08/20/2021 INDICATION: ESRD requiring HD.  Malfunctioning HD catheter placed 04/22/2021. EXAM: TUNNELED CENTRAL VENOUS HEMODIALYSIS CATHETER REPLACEMENT WITH FLUOROSCOPIC GUIDANCE MEDICATIONS: The patient was on scheduled antibiotics. ANESTHESIA/SEDATION: Local anesthetic was administered for the procedure. FLUOROSCOPY TIME:  0 minutes 3 seconds (0.1 mGy). COMPLICATIONS: None immediate. PROCEDURE: Informed written consent was obtained from the patient and/or patient's representative  after a discussion of the risks, benefits, and alternatives to treatment. Questions regarding the procedure were encouraged and answered. The skin and external portion of the existing hemodialysis catheter was prepped with chlorhexidine in a sterile fashion, and a sterile drape was applied covering the operative field. Maximum barrier sterile technique with sterile gowns and gloves were used for the procedure. A timeout was performed prior to the initiation of the procedure. Both lumens of the hemodialysis catheter were cannulated with a stiff Glidewire and advanced to the level of the right atrium. Under intermittent fluoroscopic guidance, the existing dialysis catheter was exchanged for a new 28 cm tip to cuff palindrome dialysis catheter with tips ultimately terminating within the right atrium. Final catheter positioning was confirmed and documented with a spot radiographic image. The catheter aspirates and flushes normally. The catheter was flushed with appropriate volume heparin dwells. The catheter exit site was secured with a 0-Prolene retention sutures. Both lumens were heparinized. A dressing was placed. The patient tolerated the procedure well without immediate post procedural complication. IMPRESSION: Successful replacement for a 28 cm tip to cuff tunneled hemodialysis catheter, with tips terminating within the right atrium. The catheter is ready for immediate use. Michaelle Birks, MD [facility] Radiology Electronically Signed   By: Michaelle Birks M.D.   On: 08/20/2021 17:16    ASSESSMENT/PLAN:  Assessment: Principal Problem:   Volume overload Active Problems:   Acute on chronic systolic (congestive) heart failure ([name])   Morbid obesity ([name])   OSA (obstructive sleep apnea)   Chronic cough   ESRD (end stage renal disease) ([name])   Paroxysmal atrial flutter ([name])   MSSA bacteremia   Plan: ESRD on HD TTS  Malfunction HD catheter Patient had HD  yesterday evening with improvement of his symptoms.  Patient had fistula placed today with some residual pain. Will plan for repeat HD tomorrow. -Appreciate nephrology and VSS recommendation -Strict ins and outs -Daily renal function with electrolytes - Cont pain management with oxy-acta 5-325 mg    Chronic HFrEF Nonischemic cardiomyopathy Hypervolemia improving with HD. Patient has a history of HFrEF with GDMT limited by hypotension on midodrine. Will need to try to add back entresto when possible.  -Continue midodrine - Titrate GDMT when possible. - Will need cardiology f/u to adjust meds.   Paroxysmal Afib -Consider holding amiodarone in the setting of elevated transaminase enzymes - Will restart eliquis tomorrow.   Elevated LFT Patient continues to have elevated LFTs with mixed hepatocellular/cholestasis pattern with elevated bilirubin but normal alk phos. was concern for possible congestive  hepatopathy in the setting of significant volume overload. I would expect that the liver enzymes were trending downward with hemodialysis. Patient has HCV serology pending previous HBV showed positive surface antibody.  Previous HIV was negative.  It is possible the patient could be having medication side effect from amiodarone.  Therefore we could consider holding this medication. -Trend CMP. GGT pending  - HCV pending - RUQ [place] was negative for cirrhosis/steatosis  - Consider holding amiodarone    Normocytic anemia Patient has a normocytic anemia of 9.4 with an elevated RDW of 19.1.  Recent iron studies show an elevated TIBC and iron of 48, ferritin of 98, and a transferrin saturation of 11.  This indicates a mixed anemia with iron deficiency anemia and anemia of chronic kidney disease. - Patient will need iron supplementation.   MSSA septic arthritis of left sacroiliac joint and sacral osteomyelitis (03/2021) Hx of MRSA infection (2018) - Continue Keflex  - Follow up with ID on 08/26/21.    OSA -CPAP at night  Best Practice: Diet: Renal diet IVF: none VTE: Eliquis 5 mg BID Code: Full AB: keflex until 08/26/2021 Therapy Recs: Pending, DME: none Family Contact: none, patient will inform his family DISPO: Anticipated discharge in 2-3 days to Home pending Hemodialysis   Signature: Lawerance Cruel, D.O.  Internal Medicine Resident, PGY-3 [facility]  Pager: [phone] 6:56 AM, 08/21/2021   Please contact the on call pager after 5 pm and on weekends at [phone].

## 2021-08-21 NOTE — Anesthesia Procedure Notes (Addendum)
Procedure Name: LMA Insertion Date/Time: 08/21/2021 10:54 AM Performed by: Erick Colace, CRNA Pre-anesthesia Checklist: Patient identified, Emergency Drugs available, Suction available and Patient being monitored Patient Re-evaluated:Patient Re-evaluated prior to induction Oxygen Delivery Method: Circle system utilized Preoxygenation: Pre-oxygenation with 100% oxygen Induction Type: IV induction Ventilation: Mask ventilation without difficulty LMA: LMA flexible inserted LMA Size: 5.0 Laser Tube: Cuffed inflated with minimal occlusive pressure - saline Placement Confirmation: positive ETCO2 and breath sounds checked- equal and bilateral Tube secured with: Tape Dental Injury: Teeth and Oropharynx as per pre-operative assessment

## 2021-08-21 NOTE — Anesthesia Postprocedure Evaluation (Signed)
Anesthesia Post Note  Patient: Phillip Young  Procedure(s) Performed: LEFT ARM ARTERIOVENOUS (AV) FISTULA. (Left: Arm Lower)     Patient location during evaluation: PACU Anesthesia Type: General Level of consciousness: sedated Pain management: pain level controlled Vital Signs Assessment: post-procedure vital signs reviewed and stable Respiratory status: spontaneous breathing and respiratory function stable Cardiovascular status: stable Postop Assessment: no apparent nausea or vomiting Anesthetic complications: no   No notable events documented.  Last Vitals:  Vitals:   08/21/21 1345 08/21/21 1411  BP:  129/89  Pulse:  97  Resp:  20  Temp:  36.7 C  SpO2: 93% 100%    Last Pain:  Vitals:   08/21/21 1411  TempSrc:   PainSc: 10-Worst pain ever                 Ramina Hulet DANIEL

## 2021-08-21 NOTE — Progress Notes (Signed)
[facility] Progress Note   Subjective: To OR for new permanent access. Was scheduled for extra treatment today but doesn't feel like going to HD. No respiratory distress. Will reschedule HD for his regular schedule tomorrow.   Objective Vitals:   08/21/21 1325 08/21/21 1340 08/21/21 1345 08/21/21 1411  BP: 102/62 105/61  129/89  Pulse: 97 98  97  Resp: (!) 32 (!) 31  20  Temp:  97.8 F (36.6 C)  98.1 F (36.7 C)  TempSrc:      SpO2: 99% 93% 93% 100%  Weight:      Height:       Physical Exam General: Pleasant obese male in NAD Heart: Distant, S1,S2 Lungs: CTAB Abdomen: NABS. NT, Nd Extremities: Trace-1+BLE edema Dialysis Access: RIJ TDC drsg intact. L AVF +T/B   Additional Objective Labs: Basic Metabolic Panel: Recent Labs  Lab 08/19/21 0540 08/20/21 0235 08/21/21 0058  NA 133* 139 135  K 4.0 4.2 3.4*  CL 96* 95* 99  CO2 21* 22 23  GLUCOSE 94 102* 98  BUN 62* 69* 47*  CREATININE 10.36* 11.41* 8.93*  CALCIUM 9.4 9.9 8.7*  PHOS 5.7*  --   --    Liver Function Tests: Recent Labs  Lab 08/18/21 1132 08/19/21 0540 08/20/21 0235 08/21/21 0058  AST 72*  --  197* 297*  ALT 147*  --  260* 362*  ALKPHOS 99  --  100 106  BILITOT 1.8*  --  1.7* 1.6*  PROT 7.6  --  7.2 7.4  ALBUMIN 3.5 3.3* 3.3* 3.4*   Recent Labs  Lab 08/18/21 1132  LIPASE 38   CBC: Recent Labs  Lab 08/18/21 1132 08/18/21 1223 08/19/21 0540 08/20/21 0235 08/21/21 0058  WBC 8.7  --  8.0 7.8 7.2  NEUTROABS 5.9  --   --   --   --   HGB 9.6*   < > 9.0* 9.0* 9.4*  HCT 29.7*   < > 27.4* 27.4* 29.4*  MCV 99.3  --  98.2 98.2 100.0  PLT 282  --  251 256 225   < > = values in this interval not displayed.   Blood Culture    Component Value Date/Time   SDES BLOOD LEFT HAND 04/19/2021 1137   SPECREQUEST  04/19/2021 1137    BOTTLES DRAWN AEROBIC AND ANAEROBIC Blood Culture adequate volume   CULT  04/19/2021 1137    NO GROWTH 5 DAYS Performed at [facility],  [place] [address], [place], [place] [address]    REPTSTATUS 04/24/2021 FINAL 04/19/2021 1137    Cardiac Enzymes: No results for input(s): CKTOTAL, CKMB, CKMBINDEX, TROPONINI in the last 168 hours. CBG: Recent Labs  Lab 08/21/21 0926  GLUCAP 104*   Iron Studies:  Recent Labs    08/21/21 0058  IRON 48  TIBC 451*  FERRITIN 98   @lablastinr3 @ Studies/Results: IR Fluoro Guide CV Line Right  Result Date: 08/20/2021 INDICATION: ESRD requiring HD.  Malfunctioning HD catheter placed 04/22/2021. EXAM: TUNNELED CENTRAL VENOUS HEMODIALYSIS CATHETER REPLACEMENT WITH FLUOROSCOPIC GUIDANCE MEDICATIONS: The patient was on scheduled antibiotics. ANESTHESIA/SEDATION: Local anesthetic was administered for the procedure. FLUOROSCOPY TIME:  0 minutes 3 seconds (0.1 mGy). COMPLICATIONS: None immediate. PROCEDURE: Informed written consent was obtained from the patient and/or patient's representative after a discussion of the risks, benefits, and alternatives to treatment. Questions regarding the procedure were encouraged and answered. The skin and external portion of the existing hemodialysis catheter was prepped with chlorhexidine in a sterile fashion,  and a sterile drape was applied covering the operative field. Maximum barrier sterile technique with sterile gowns and gloves were used for the procedure. A timeout was performed prior to the initiation of the procedure. Both lumens of the hemodialysis catheter were cannulated with a stiff Glidewire and advanced to the level of the right atrium. Under intermittent fluoroscopic guidance, the existing dialysis catheter was exchanged for a new 28 cm tip to cuff palindrome dialysis catheter with tips ultimately terminating within the right atrium. Final catheter positioning was confirmed and documented with a spot radiographic image. The catheter aspirates and flushes normally. The catheter was flushed with appropriate volume heparin dwells. The catheter exit site was secured  with a 0-Prolene retention sutures. Both lumens were heparinized. A dressing was placed. The patient tolerated the procedure well without immediate post procedural complication. IMPRESSION: Successful replacement for a 28 cm tip to cuff tunneled hemodialysis catheter, with tips terminating within the right atrium. The catheter is ready for immediate use. Michaelle Birks, MD [facility] Radiology Electronically Signed   By: Michaelle Birks M.D.   On: 08/20/2021 17:16   US Abdomen Limited RUQ (LIVER/GB)  Result Date: 08/20/2021 CLINICAL DATA:  Increased LFTs. EXAM: ULTRASOUND ABDOMEN LIMITED RIGHT UPPER QUADRANT COMPARISON:  CT abdomen and pelvis 02/19/2020. FINDINGS: Gallbladder: No gallstones or wall thickening visualized. No sonographic Murphy sign noted by sonographer. Common bile duct: Diameter: 3.6 mm. Liver: No focal lesion identified. Within normal limits in parenchymal echogenicity. Portal vein is patent on color Doppler imaging with normal direction of blood flow towards the liver. Other: None. IMPRESSION: Normal right upper quadrant ultrasound. Electronically Signed   By: Ronney Asters M.D.   On: 08/20/2021 01:16   Medications:  [START ON 08/22/2021] sodium chloride     [START ON 08/22/2021] sodium chloride     anticoagulant sodium citrate      amiodarone  200 mg Oral Daily   amitriptyline  10 mg Oral QHS   benzonatate  100 mg Oral BID   cephALEXin  500 mg Oral Daily   Chlorhexidine Gluconate Cloth  6 each Topical Daily   [START ON 08/28/2021] darbepoetin (ARANESP) injection - DIALYSIS  60 mcg Intravenous Q Fri-HD   darbepoetin (ARANESP) injection - DIALYSIS  60 mcg Intravenous Q Thu-HD   famotidine  20 mg Oral Daily   lanthanum  500 mg Oral TID WC   midodrine  20 mg Oral TID WC   oxyCODONE  5 mg Oral Once     Dialysis Orders:  Unit: Post  Time: 4:00 EDW: 175 kg  Flows: 425  [place]: 4K/2.25 Ca Access: TDC  Heparin: None  (contraindication) ESA: Mircera 30 mcg IV q 2 weeks (last 1/12)  VDRA: Hectorol 2 mcg IV TIW    Assessment/Plan: Malfunctioning HD access: Presented with malfunctioning dialysis catheter. He was unable to dialyze at his center. Went for [facility] exchange 08/20/2021 per Dr. Maryelizabeth Kaufmann. To OR today for permanent access per Dr. Trula Slade. ESRD--T,Th,S. Next HD 08/21/2021 Volume overload. Lower volume as tolerated.  Hypertension - BP acceptable. Midodrine for BP support on HD  5.   Anemia  - Hgb 9.6 On ESA. Rec'd 60 mcg IV 08/20/2021. 6.  Metabolic bone disease -  Calcium ok. Phos 5.7. Continue home binders.  7.  AFib- on Eliquis  8.   OSA - continue CPAP  9.   HFrEF - Try to optimize volume with dialysis   Malory Spurr H. Kayveon Lennartz NP-C 08/21/2021, 3:26 PM  [facility] [phone]

## 2021-08-21 NOTE — Progress Notes (Signed)
The patient became upset when reminded he is NPO for procedure.  Patient stated "I will get water from the sink if I am thirsty."  He was educated about potential risks of eating/drinking the day of surgery.  This did not sway him. The oncall provider was made aware.  Will continue to monitor patient.

## 2021-08-21 NOTE — Anesthesia Procedure Notes (Addendum)
Procedure Name: Intubation Date/Time: 08/21/2021 11:50 AM Performed by: Erick Colace, CRNA Pre-anesthesia Checklist: Patient identified, Emergency Drugs available, Suction available and Patient being monitored Patient Re-evaluated:Patient Re-evaluated prior to induction Oxygen Delivery Method: Circle system utilized Preoxygenation: Pre-oxygenation with 100% oxygen Induction Type: IV induction Ventilation: Mask ventilation without difficulty Laryngoscope Size: Glidescope and 4 Grade View: Grade II Tube type: Oral Tube size: 7.5 mm Number of attempts: 1 Airway Equipment and Method: Stylet and Oral airway Placement Confirmation: ETT inserted through vocal cords under direct vision, positive ETCO2 and breath sounds checked- equal and bilateral Secured at: 23 cm Tube secured with: Tape Dental Injury: Teeth and Oropharynx as per pre-operative assessment

## 2021-08-21 NOTE — Anesthesia Procedure Notes (Deleted)
Procedure Name: Intubation Date/Time: 08/21/2021 11:52 AM Performed by: Erick Colace, CRNA Pre-anesthesia Checklist: Patient identified, Emergency Drugs available, Suction available and Patient being monitored Patient Re-evaluated:Patient Re-evaluated prior to induction Oxygen Delivery Method: Circle system utilized Preoxygenation: Pre-oxygenation with 100% oxygen Induction Type: IV induction Ventilation: Mask ventilation without difficulty Laryngoscope Size: Glidescope and 4 Grade View: Grade II Tube type: Oral Number of attempts: 1 Airway Equipment and Method: Stylet and Oral airway Placement Confirmation: ETT inserted through vocal cords under direct vision, positive ETCO2 and breath sounds checked- equal and bilateral Secured at: 24 cm Tube secured with: Tape Dental Injury: Teeth and Oropharynx as per pre-operative assessment

## 2021-08-21 NOTE — Addendum Note (Signed)
Addendum  created 08/21/21 1451 by Erick Colace, CRNA   Flowsheet accepted, Intraprocedure Event edited, Intraprocedure Meds edited

## 2021-08-21 NOTE — Transfer of Care (Signed)
Immediate Anesthesia Transfer of Care Note  Patient: Demonie Kassa  Procedure(s) Performed: LEFT ARM ARTERIOVENOUS (AV) FISTULA. (Left: Arm Lower)  Patient Location: PACU  Anesthesia Type:General  Level of Consciousness: drowsy  Airway & Oxygen Therapy: Patient Spontanous Breathing and Patient connected to face mask oxygen  Post-op Assessment: Report given to RN and Post -op Vital signs reviewed and stable  Post vital signs: Reviewed and stable  Last Vitals:  Vitals Value Taken Time  BP 107/76 08/21/21 1255  Temp    Pulse 127 08/21/21 1257  Resp 32 08/21/21 1257  SpO2 96 % 08/21/21 1257  Vitals shown include unvalidated device data.  Last Pain:  Vitals:   08/21/21 0914  TempSrc: Oral  PainSc:          Complications: No notable events documented.

## 2021-08-22 ENCOUNTER — Encounter (HOSPITAL_COMMUNITY): Payer: Self-pay | Admitting: Surgery

## 2021-08-22 DIAGNOSIS — E8779 Other fluid overload: Secondary | ICD-10-CM | POA: Diagnosis not present

## 2021-08-22 DIAGNOSIS — Z48812 Encounter for surgical aftercare following surgery on the circulatory system: Secondary | ICD-10-CM

## 2021-08-22 LAB — RENAL FUNCTION PANEL
Albumin: 3.5 g/dL (ref 3.5–5.0)
Anion gap: 14 (ref 5–15)
BUN: 40 mg/dL — ABNORMAL HIGH (ref 6–20)
CO2: 22 mmol/L (ref 22–32)
Calcium: 9.1 mg/dL (ref 8.9–10.3)
Chloride: 98 mmol/L (ref 98–111)
Creatinine, Ser: 7.57 mg/dL — ABNORMAL HIGH (ref 0.61–1.24)
GFR, Estimated: 9 mL/min — ABNORMAL LOW (ref >60)
Glucose, Bld: 142 mg/dL — ABNORMAL HIGH (ref 70–99)
Phosphorus: 3.8 mg/dL (ref 2.5–4.6)
Potassium: 4.1 mmol/L (ref 3.5–5.1)
Sodium: 134 mmol/L — ABNORMAL LOW (ref 135–145)

## 2021-08-22 LAB — HCV AB W REFLEX TO QUANT PCR: HCV Ab: <0.1 s/co ratio (ref 0.0–0.9)

## 2021-08-22 LAB — HCV INTERPRETATION

## 2021-08-22 NOTE — Progress Notes (Signed)
°  Transition of Care [facility]) Screening Note   Patient Details  Name: Phillip Young Date of Birth: October 17, 1985   Transition of Care [facility]) CM/SW Contact:    Bartholomew Crews, RN Phone Number: [phone] 08/22/2021, 1:22 PM    Transition of Care Department [facility]) has reviewed patient and no TOC needs have been identified at this time.

## 2021-08-22 NOTE — Plan of Care (Signed)
°  Problem: Fluid Volume: Goal: Compliance with measures to maintain balanced fluid volume will improve Outcome: Progressing   Problem: Health Behavior/Discharge Planning: Goal: Ability to manage health-related needs will improve Outcome: Progressing   Problem: Nutritional: Goal: Ability to make healthy dietary choices will improve Outcome: Progressing

## 2021-08-22 NOTE — Progress Notes (Signed)
DISCHARGE NOTE HOME Phillip Young to be discharged Home per MD order. Discussed prescriptions and follow up appointments with the patient. Prescriptions given to patient; medication list explained in detail. Patient verbalized understanding.  Skin clean, dry and intact without evidence of skin break down, no evidence of skin tears noted. IV catheter discontinued intact. Site without signs and symptoms of complications. Dressing and pressure applied. Pt denies pain at the site currently. No complaints noted.  Patient free of lines, drains, and wounds.   An After Visit Summary (AVS) was printed and given to the patient. Patient escorted via wheelchair, and discharged home via private auto.  Dolores Hoose, RN

## 2021-08-22 NOTE — Discharge Summary (Addendum)
Name: Phillip Young MRN: [id number] DOB: February 19, 1986 36 y.o. PCP: Phillip Pope, MD  Date of Admission: 08/18/2021 10:01 AM Date of Discharge: No discharge date for patient encounter. Attending Physician: Aldine Contes, MD  Discharge Diagnosis: 1. Malfunction HD Catheter 2. 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   apixaban 5 MG Tabs tablet Commonly known as: Eliquis Take 1 tablet (5 mg total) by mouth 2 (two) times daily.   cephALEXin 500 MG capsule Commonly known as: KEFLEX Take 1 capsule (500 mg total) by mouth daily. Take 1 at bedtime every day.   famotidine 20 MG tablet Commonly known as: PEPCID Take 1 tablet (20 mg total) by mouth 2 (two) times daily. What changed:  when to take this reasons to take this   hydrOXYzine 25 MG tablet Commonly known as: ATARAX Take 25 mg by mouth 2 (two) times daily.   lanthanum 500 MG chewable tablet Commonly known as: FOSRENOL Chew 500 mg by mouth 3 (three) times daily with meals.   midodrine 10 MG tablet Commonly known as: PROAMATINE Take 2 tablets (20 mg total) by mouth 3 (three) times daily with meals.   multivitamin Tabs tablet Take 1 tablet by mouth at bedtime.   sodium chloride 0.65 % Soln nasal spray Commonly known as: OCEAN Place 1 spray into both nostrils as needed for  congestion.               Discharge Care Instructions  (From admission, onward)           Start     Ordered   08/22/21 0000  No dressing needed        08/22/21 1158            Disposition and follow-up:   [name] was discharged from [facility] in Stable condition.  At the hospital follow up visit please address:  1.  Left brachial artery to cephalic vein AV fistula: Check site of fistula for healing.  Review medications. Discuss other health care goals if time allows.   2.  Labs / imaging needed at time of follow-up: CMP.  Patient noted to have elevated transaminases with an AST of 297 and an ALT of 362 possibly secondary to hepatic congestion.  Will need outpatient follow-up. Amiodarone held for elevated LFTs. Could consider restarting if improving and monitor closely  3.  Pending labs/ test needing follow-up: none   Follow-up Appointments:  Follow-up Information     [facility] Follow up in 6 week(s).   Specialty: Vascular Surgery Why: Office will call you to arrange your appt (sent) Contact information: [place] [phone]        Phillip Pope, MD Follow up.   Specialty:  Internal Medicine Why: Hospital Follow up in 1- 2 weeks. We will ask for our staff to call you. Contact information: [place] [address] [place] by problem list: ESRD on HD TTS  Malfunctioning HD catheter  In brief patient was admitted to [facility] from 1/19-1/21 for hypervolemia was treated with diuresis and dialysis.  Patient arrived to regularly scheduled outpatient dialysis center 1/24 but staff was unable to access his dialysis catheter.  Patient noting worsening shortness of breath requiring up to 4 L from baseline of 2 L over the last several days.  Patient was afebrile tachycardic in the low 100s tachypneic  and mildly hypertensive on presentation.  He was saturating 92% on room air but placed on 2 L nasal cannula.  BNP elevated at 653, stable anemia, normal potassium.  Troponin 26x2.  AST of 72, ALT 147 which appears new from our labs 3 months ago, however downtrending from recent hospitalization 1/21.  CTA showing pulmonary edema. Patient had tunnel catheter exchange on 08/20/2021.  He was taken for left brachial artery to cephalic vein AV fistula creation with vascular surgery on 08/21/2021.  He underwent hemodialysis on 08/22/2021 and discharged after.  Plan to resume regular T TH S HD at his center and discharge.   Chronic HFrEF Nonischemic cardiomyopathy Patient history of HFrEF status post cardiogenic shock in September 2022.  Echocardiogram in October 2022 show EF of 20%, nonischemic in nature.  Could not get cardiac MRI due to body habitus.  Patient is not a candidate for ICD either given BMI.  Patient was continued on midodrine 20 mg 3 times daily, and volume managed controlled per nephrology with HD.   Paroxysmal Afib Patient was continued on amiodarone and Eliquis.   Anemia of chronic disease Managed with ESA throughout his hospitalization.  MSSA septic arthritis of left sacroiliac joint and sacral osteomyelitis (03/2021) Hx of MRSA infection (2018) Initially on IV antibiotics and transitioned to Keflex at d/c in 04/2021. Follows with Dr Vu/ ID and recent MRI showing improvement in septic arthritis and resolution of abscesses in joint. Plan for follow up with ID on 08/26/21.  Otherwise, continued his home Keflex during this hospitalization.   OSA Wore CPAP at night throughout his hospitalization.    Discharge Exam:   BP 105/82 (BP Location: Right Arm)    Pulse 92    Temp 98 F (36.7 C) (Oral)    Resp 18    Ht 6' (1.829 m)    Wt (!) 191 kg    SpO2 99%    BMI 57.11 kg/m  Discharge exam:  General: Obese male in no acute distress on hemodialysis Cardiovascular: Regular rate and rhythm, no  murmurs Pulmonary: No adventitious breath sounds, normal work of breathing Extremities: Palpable thrill, audible bruit left AVF  Pertinent Labs, Studies, and Procedures:  CBC Latest Ref Rng & Units 08/21/2021 08/20/2021 08/19/2021  WBC 4.0 - 10.5 K/uL 7.2 7.8 8.0  Hemoglobin 13.0 - 17.0 g/dL 9.4(L) 9.0(L) 9.0(L)  Hematocrit 39.0 - 52.0 % 29.4(L) 27.4(L) 27.4(L)  Platelets 150 - 400 K/uL 225 256 251   BMP Latest Ref Rng & Units 08/21/2021 08/20/2021 08/19/2021  Glucose 70 - 99 mg/dL 98 102(H) 94  BUN 6 - 20 mg/dL 47(H) 69(H) 62(H)  Creatinine 0.61 - 1.24 mg/dL 8.93(H) 11.41(H) 10.36(H)  Sodium 135 - 145 mmol/L 135 139 133(L)  Potassium 3.5 -  5.1 mmol/L 3.4(L) 4.2 4.0  Chloride 98 - 111 mmol/L 99 95(L) 96(L)  CO2 22 - 32 mmol/L 23 22 21(L)  Calcium 8.9 - 10.3 mg/dL 8.7(L) 9.9 9.4   DG Chest 2 View  Result Date: 08/18/2021 CLINICAL DATA:  36 year old male with history of shortness of breath and cough. EXAM: CHEST - 2 VIEW COMPARISON:  Chest x-ray 08/13/2021. FINDINGS: Right internal jugular PermCath with tip terminating in the distal superior vena cava. Lung volumes are slightly low. No acute consolidative airspace disease. No pleural effusions. No pneumothorax. Cephalization of the pulmonary vasculature, without frank pulmonary edema. Moderate cardiomegaly. Upper mediastinal contours are within normal limits. IMPRESSION: 1. Moderate cardiomegaly with pulmonary venous congestion, but no frank pulmonary edema. Electronically Signed   By: Vinnie Langton M.D.   On: 08/18/2021 10:58   CT Angio Chest PE W and/or Wo Contrast  Result Date: 08/18/2021 CLINICAL DATA:  Shortness of breath EXAM: CT ANGIOGRAPHY CHEST WITH CONTRAST TECHNIQUE: Multidetector CT imaging of the chest was performed using the standard protocol during bolus administration of intravenous contrast. Multiplanar CT image reconstructions and MIPs were obtained to evaluate the vascular anatomy. RADIATION DOSE REDUCTION: This exam was  performed according to the departmental dose-optimization program which includes automated exposure control, adjustment of the mA and/or kV according to patient size and/or use of iterative reconstruction technique. CONTRAST:  156mL OMNIPAQUE IOHEXOL 350 MG/ML SOLN COMPARISON:  CT chest dated October 09, 2020 FINDINGS: Cardiovascular: Adequate contrast opacification of the pulmonary arteries. Limited evaluation of the proximal segmental and subsegmental pulmonary arteries due to patient body habitus cardiomegaly. Small pericardial effusion. No significant coronary artery calcifications or atherosclerotic disease of the thoracic aorta. Right central venous line with tip near the superior cavoatrial junction. Mediastinum/Nodes: Enlarged mediastinal and hilar lymph nodes, unchanged when compared with prior CT. Reference periaortic lymph node located on series 3, image 99 measuring 1.8 cm in short axis. Lungs/Pleura: Central airways are patent. Mild bilateral air trapping. Diffuse bilateral ground-glass opacities. No pleural effusion or pneumothorax. Upper Abdomen: Reflux of contrast is noted in the hepatic veins which can be seen in the setting of right heart dysfunction. Musculoskeletal: No chest wall abnormality. No acute or significant osseous findings. Review of the MIP images confirms the above findings. IMPRESSION: 1. Cardiomegaly and small pericardial effusion. 2. Diffuse bilateral ground-glass opacities, likely due to pulmonary edema. 3. Bilateral air trapping, findings can be seen in the setting small airways disease. Electronically Signed   By: Yetta Glassman M.D.   On: 08/18/2021 14:29   VAS [place] UPPER EXT VEIN MAPPING (PRE-OP AVF)  Result Date: 08/19/2021 UPPER EXTREMITY VEIN MAPPING Patient Name:  ANTHON HARPOLE  Date of Exam:   08/19/2021 Medical Rec #: [id number]       Accession #:    [id number] Date of Birth: [birth year]        Patient Gender: M Patient Age:   56 years Exam Location[facility]  [facility]  Procedure:      VAS [place] UPPER EXT VEIN MAPPING (PRE-OP AVF) Referring Phys: Karoline Caldwell --------------------------------------------------------------------------------  Indications: Pre-access. History: ESRD.  Comparison Study: No prior studies. Performing Technologist: Darlin Coco RDMS, RVT  Examination Guidelines: A complete evaluation includes B-mode imaging, spectral Doppler, color Doppler, and power Doppler as needed of all accessible portions of each vessel. Bilateral testing is considered an integral part of a complete examination. Limited examinations for reoccurring indications may be performed as noted. +-----------------+-------------+----------+--------+  Right Cephalic    Diameter (cm) Depth (cm) Findings  +-----------------+-------------+----------+--------+  Shoulder              0.51                           +-----------------+-------------+----------+--------+  Prox upper arm        0.51                           +-----------------+-------------+----------+--------+  Mid upper arm         0.48                           +-----------------+-------------+----------+--------+  Dist upper arm        0.48                           +-----------------+-------------+----------+--------+  Antecubital fossa     0.59                           +-----------------+-------------+----------+--------+  Prox forearm          0.38                           +-----------------+-------------+----------+--------+  Mid forearm           0.29                           +-----------------+-------------+----------+--------+  Dist forearm          0.20                           +-----------------+-------------+----------+--------+ +-----------------+-------------+----------+---------+  Right Basilic     Diameter (cm) Depth (cm) Findings   +-----------------+-------------+----------+---------+  Shoulder              0.56                            +-----------------+-------------+----------+---------+  Prox upper arm         0.59                            +-----------------+-------------+----------+---------+  Mid upper arm         0.63                            +-----------------+-------------+----------+---------+  Dist upper arm        0.47                            +-----------------+-------------+----------+---------+  Antecubital fossa     0.45                            +-----------------+-------------+----------+---------+  Prox forearm          0.24                 branching  +-----------------+-------------+----------+---------+  Mid forearm           0.22                            +-----------------+-------------+----------+---------+  Distal forearm        0.25                            +-----------------+-------------+----------+---------+  Wrist                 0.22                            +-----------------+-------------+----------+---------+ *See table(s) above for measurements and observations.  Diagnosing physician: Harold Barban MD Electronically signed by Harold Barban MD on 08/19/2021 at 9:16:04 PM.    Final       Discharge Instructions: Discharge Instructions     Diet - low sodium heart healthy   Complete by: As directed    Increase activity slowly   Complete by: As directed    No dressing needed   Complete by: As directed        Signed: Madalyn Rob, MD 08/22/2021, 1:04 PM

## 2021-08-22 NOTE — Progress Notes (Signed)
°  Progress Note    08/22/2021 9:29 AM 1 Day Post-Op  Subjective: States that his left arm hurts but otherwise he is doing okay  Vitals:   08/22/21 0830 08/22/21 0900  BP: (!) 151/84 134/78  Pulse: 96 98  Resp:    Temp:    SpO2:      Physical Exam: Awake alert oriented Currently on dialysis via tunneled catheter Left upper arm fistula is palpable with thrill No palpable left radial pulse but his hand is warm and sensorimotor intact  CBC    Component Value Date/Time   WBC 7.2 08/21/2021 0058   RBC 2.94 (L) 08/21/2021 0058   HGB 9.4 (L) 08/21/2021 0058   HCT 29.4 (L) 08/21/2021 0058   PLT 225 08/21/2021 0058   MCV 100.0 08/21/2021 0058   MCH 32.0 08/21/2021 0058   MCHC 32.0 08/21/2021 0058   RDW 19.1 (H) 08/21/2021 0058   LYMPHSABS 1.8 08/18/2021 1132   MONOABS 0.6 08/18/2021 1132   EOSABS 0.3 08/18/2021 1132   BASOSABS 0.1 08/18/2021 1132    BMET    Component Value Date/Time   NA 135 08/21/2021 0058   K 3.4 (L) 08/21/2021 0058   CL 99 08/21/2021 0058   CO2 23 08/21/2021 0058   GLUCOSE 98 08/21/2021 0058   BUN 47 (H) 08/21/2021 0058   CREATININE 8.93 (H) 08/21/2021 0058   CALCIUM 8.7 (L) 08/21/2021 0058   GFRNONAA 7 (L) 08/21/2021 0058   GFRAA >60 03/27/2020 1055    INR    Component Value Date/Time   INR 1.5 (H) 08/19/2021 1402     Intake/Output Summary (Last 24 hours) at 08/22/2021 0929 Last data filed at 08/22/2021 0720 Gross per 24 hour  Intake 1590 ml  Output 50 ml  Net 1540 ml     Assessment:  36 y.o. male is s/p left brachial artery to cephalic vein AV fistula creation  Plan: Okay for discharge from vascular standpoint  Follow-up in 4 to 6 weeks with dialysis duplex left upper extremity, will likely require superficialization of fistula prior to use.    Adele Milson C. Donzetta Matters, MD [facility] Office: [phone] Pager: [phone]  08/22/2021 9:29 AM

## 2021-08-22 NOTE — Progress Notes (Signed)
[facility] Progress Note   Subjective: Seen on HD via TDC. No C/Os.     Objective Vitals:   08/21/21 2023 08/22/21 0514 08/22/21 0752 08/22/21 0757  BP: 119/62 (!) 86/70 95/76 130/73  Pulse: 96 95 89 95  Resp: 18 18 18    Temp: 98.1 F (36.7 C) 97.6 F (36.4 C) (!) 97.1 F (36.2 C)   TempSrc: Oral Oral Temporal   SpO2: 93% 94% 99%   Weight:   (!) 191 kg   Height:       Physical Exam General: Pleasant obese male in NAD Heart: Distant, S1,S2 Lungs: CTAB Abdomen: NABS. NT, Nd Extremities: Trace-1+BLE edema Dialysis Access: RIJ TDC drsg intact. L AVF +T/B      Additional Objective Labs: Basic Metabolic Panel: Recent Labs  Lab 08/19/21 0540 08/20/21 0235 08/21/21 0058  NA 133* 139 135  K 4.0 4.2 3.4*  CL 96* 95* 99  CO2 21* 22 23  GLUCOSE 94 102* 98  BUN 62* 69* 47*  CREATININE 10.36* 11.41* 8.93*  CALCIUM 9.4 9.9 8.7*  PHOS 5.7*  --   --    Liver Function Tests: Recent Labs  Lab 08/18/21 1132 08/19/21 0540 08/20/21 0235 08/21/21 0058  AST 72*  --  197* 297*  ALT 147*  --  260* 362*  ALKPHOS 99  --  100 106  BILITOT 1.8*  --  1.7* 1.6*  PROT 7.6  --  7.2 7.4  ALBUMIN 3.5 3.3* 3.3* 3.4*   Recent Labs  Lab 08/18/21 1132  LIPASE 38   CBC: Recent Labs  Lab 08/18/21 1132 08/18/21 1223 08/19/21 0540 08/20/21 0235 08/21/21 0058  WBC 8.7  --  8.0 7.8 7.2  NEUTROABS 5.9  --   --   --   --   HGB 9.6*   < > 9.0* 9.0* 9.4*  HCT 29.7*   < > 27.4* 27.4* 29.4*  MCV 99.3  --  98.2 98.2 100.0  PLT 282  --  251 256 225   < > = values in this interval not displayed.   Blood Culture    Component Value Date/Time   SDES BLOOD LEFT HAND 04/19/2021 1137   SPECREQUEST  04/19/2021 1137    BOTTLES DRAWN AEROBIC AND ANAEROBIC Blood Culture adequate volume   CULT  04/19/2021 1137    NO GROWTH 5 DAYS Performed at [facility], [place] [address] [place], [place] [address]    REPTSTATUS 04/24/2021 FINAL 04/19/2021 1137    Cardiac  Enzymes: No results for input(s): CKTOTAL, CKMB, CKMBINDEX, TROPONINI in the last 168 hours. CBG: Recent Labs  Lab 08/21/21 0926  GLUCAP 104*   Iron Studies:  Recent Labs    08/21/21 0058  IRON 48  TIBC 451*  FERRITIN 98   @lablastinr3 @ Studies/Results: IR Fluoro Guide CV Line Right  Result Date: 08/20/2021 INDICATION: ESRD requiring HD.  Malfunctioning HD catheter placed 04/22/2021. EXAM: TUNNELED CENTRAL VENOUS HEMODIALYSIS CATHETER REPLACEMENT WITH FLUOROSCOPIC GUIDANCE MEDICATIONS: The patient was on scheduled antibiotics. ANESTHESIA/SEDATION: Local anesthetic was administered for the procedure. FLUOROSCOPY TIME:  0 minutes 3 seconds (0.1 mGy). COMPLICATIONS: None immediate. PROCEDURE: Informed written consent was obtained from the patient and/or patient's representative after a discussion of the risks, benefits, and alternatives to treatment. Questions regarding the procedure were encouraged and answered. The skin and external portion of the existing hemodialysis catheter was prepped with chlorhexidine in a sterile fashion, and a sterile drape was applied covering the operative field. Maximum barrier sterile  technique with sterile gowns and gloves were used for the procedure. A timeout was performed prior to the initiation of the procedure. Both lumens of the hemodialysis catheter were cannulated with a stiff Glidewire and advanced to the level of the right atrium. Under intermittent fluoroscopic guidance, the existing dialysis catheter was exchanged for a new 28 cm tip to cuff palindrome dialysis catheter with tips ultimately terminating within the right atrium. Final catheter positioning was confirmed and documented with a spot radiographic image. The catheter aspirates and flushes normally. The catheter was flushed with appropriate volume heparin dwells. The catheter exit site was secured with a 0-Prolene retention sutures. Both lumens were heparinized. A dressing was placed. The patient  tolerated the procedure well without immediate post procedural complication. IMPRESSION: Successful replacement for a 28 cm tip to cuff tunneled hemodialysis catheter, with tips terminating within the right atrium. The catheter is ready for immediate use. Michaelle Birks, MD [facility] Electronically Signed   By: Michaelle Birks M.D.   On: 08/20/2021 17:16   Medications:  sodium chloride     sodium chloride     anticoagulant sodium citrate      amitriptyline  10 mg Oral QHS   apixaban  5 mg Oral BID   benzonatate  100 mg Oral BID   cephALEXin  500 mg Oral Daily   Chlorhexidine Gluconate Cloth  6 each Topical Daily   [START ON 08/28/2021] darbepoetin (ARANESP) injection - DIALYSIS  60 mcg Intravenous Q Fri-HD   darbepoetin (ARANESP) injection - DIALYSIS  60 mcg Intravenous Q Thu-HD   famotidine  20 mg Oral Daily   lanthanum  500 mg Oral TID WC   midodrine  20 mg Oral TID WC   HD orders: [facility] T,Th,S 4 hrs 180NRe 425/Autoflow 1.5 175 kg 4.0K/2.0 Ca TDC/AVF -No heparin -Hectorol 2 mcg IV TIW -Mircera 30 mcg IV TIW (last 08/06/21)  Assessment/Plan: Malfunctioning HD access: Presented with malfunctioning dialysis catheter. He was unable to dialyze at his center. [facility] for [facility] exchange 08/20/2021 per Dr. Maryelizabeth Kaufmann. To OR today for permanent access per Dr. Trula Slade. ESRD--T,Th,S. Next HD 08/25/2021 Volume overload. Lower volume as tolerated. Max UF.  Hypertension - BP acceptable. Midodrine for BP support on HD  5.   Anemia  - Hgb 9.4 On ESA. Rec'd 60 mcg IV 08/20/2021. 6.  Metabolic bone disease -  Calcium ok. Phos 5.7. Continue home binders.  7.  AFib- on Eliquis  8.   OSA - continue CPAP  9.   HFrEF - Try to optimize volume with dialysis   Thadius Smisek H. Jonny Longino NP-C 08/22/2021, 8:41 AM  [employer] [phone]

## 2021-08-22 NOTE — Plan of Care (Signed)
°  Problem: Health Behavior/Discharge Planning: Goal: Ability to manage health-related needs will improve Outcome: Adequate for Discharge   Problem: Clinical Measurements: Goal: Ability to maintain clinical measurements within normal limits will improve Outcome: Adequate for Discharge Goal: Will remain free from infection Outcome: Adequate for Discharge Goal: Diagnostic test results will improve Outcome: Adequate for Discharge Goal: Respiratory complications will improve Outcome: Adequate for Discharge Goal: Cardiovascular complication will be avoided Outcome: Adequate for Discharge   Problem: Activity: Goal: Risk for activity intolerance will decrease Outcome: Adequate for Discharge   Problem: Nutrition: Goal: Adequate nutrition will be maintained Outcome: Adequate for Discharge   Problem: Coping: Goal: Level of anxiety will decrease Outcome: Adequate for Discharge   Problem: Elimination: Goal: Will not experience complications related to bowel motility Outcome: Adequate for Discharge Goal: Will not experience complications related to urinary retention Outcome: Adequate for Discharge   Problem: Pain Managment: Goal: General experience of comfort will improve Outcome: Adequate for Discharge   Problem: Safety: Goal: Ability to remain free from injury will improve Outcome: Adequate for Discharge   Problem: Skin Integrity: Goal: Risk for impaired skin integrity will decrease Outcome: Adequate for Discharge   Problem: Education: Goal: Knowledge of disease and its progression will improve Outcome: Adequate for Discharge Goal: Individualized Educational Video(s) Outcome: Adequate for Discharge   Problem: Fluid Volume: Goal: Compliance with measures to maintain balanced fluid volume will improve 08/22/2021 1408 by Dolores Hoose, RN Outcome: Adequate for Discharge 08/22/2021 0751 by Dolores Hoose, RN Outcome: Progressing   Problem: Health Behavior/Discharge  Planning: Goal: Ability to manage health-related needs will improve 08/22/2021 1408 by Dolores Hoose, RN Outcome: Adequate for Discharge 08/22/2021 0751 by Dolores Hoose, RN Outcome: Progressing   Problem: Nutritional: Goal: Ability to make healthy dietary choices will improve 08/22/2021 1408 by Dolores Hoose, RN Outcome: Adequate for Discharge 08/22/2021 0751 by Dolores Hoose, RN Outcome: Progressing   Problem: Clinical Measurements: Goal: Complications related to the disease process, condition or treatment will be avoided or minimized Outcome: Adequate for Discharge

## 2021-08-24 ENCOUNTER — Telehealth: Payer: Self-pay | Admitting: Physician Assistant

## 2021-08-24 NOTE — Telephone Encounter (Signed)
Transition of care contact from inpatient facility  Date of discharge: 08/22/21 Date of contact: 08/24/21 Method: Phone Spoke to: Patient  Patient contacted to discuss transition of care from recent inpatient hospitalization. Patient was admitted to [facility] from 08/18/21-08/22/21 with discharge diagnosis of malfunctioning HD catheter, volume overload and recent sepsis.   Medication changes were reviewed. Has not been taking his antibiotics, advised to pick it up and start today. He reports he accidentally slept on his left side and felt achy this AM but feels better now, arm does not appear to be red/swollen and no bleeding/drainage. AVF still has bruit per pt.   Patient will follow up with his/her outpatient HD unit on: 08/24/21. Encouraged to arrive on time to receive full HD.  Anice Paganini, PA-C 08/24/2021, 2:48 PM  [employer]

## 2021-08-25 ENCOUNTER — Telehealth: Payer: Self-pay | Admitting: Student

## 2021-08-25 DIAGNOSIS — T8249XA Other complication of vascular dialysis catheter, initial encounter: Secondary | ICD-10-CM | POA: Diagnosis not present

## 2021-08-25 DIAGNOSIS — N2581 Secondary hyperparathyroidism of renal origin: Secondary | ICD-10-CM | POA: Diagnosis not present

## 2021-08-25 DIAGNOSIS — N186 End stage renal disease: Secondary | ICD-10-CM | POA: Diagnosis not present

## 2021-08-25 DIAGNOSIS — Z992 Dependence on renal dialysis: Secondary | ICD-10-CM | POA: Diagnosis not present

## 2021-08-25 DIAGNOSIS — N179 Acute kidney failure, unspecified: Secondary | ICD-10-CM | POA: Diagnosis not present

## 2021-08-25 NOTE — Telephone Encounter (Signed)
TOC HFU appointment scheduled for Wednesday 09/02/2021 at 9:45 am with Dr. Marva Panda.  Just spoke with patient and he agreed to appointment date and time.

## 2021-08-25 NOTE — Progress Notes (Signed)
Patient Active Problem List   Diagnosis Date Noted   Volume overload 08/18/2021   ESRD on dialysis [facility])    Palliative care by specialist    Full code status    Concern about end of life    Acute on chronic respiratory failure with hypoxia ([place]) 04/21/2021   Medical non-compliance 04/21/2021   Hypotension 04/21/2021   MSSA bacteremia    ESRD (end stage renal disease) on dialysis ([place]) 04/16/2021   ESRD (end stage renal disease) ([place]) 04/12/2021   Paroxysmal atrial flutter ([place]) 04/12/2021   Acute respiratory failure with hypoxia ([name]) 04/12/2021   Hyponatremia 04/12/2021   Dyspnea    Cardiogenic shock ([name])    Atrial flutter with rapid ventricular response ([place]) 02/11/2021   Elevated troponin 02/11/2021   Chronic respiratory failure with hypoxia ([place]) 02/11/2021   CAP (community acquired pneumonia) 61/60/7371   Chronic systolic CHF (congestive heart failure) ([name])    Hypokalemia    CHF (congestive heart failure) ([name])    Chronic cough    Acute on chronic systolic (congestive) heart failure ([place]) 02/26/2020   Morbid obesity ([place]) 02/26/2020   GERD without esophagitis 02/26/2020   Diuretic-induced hypokalemia 02/26/2020   OSA (obstructive sleep apnea) 02/26/2020   Hidradenitis suppurativa 02/26/2020   Prediabetes 02/26/2020   Current Outpatient Medications on File Prior to Visit  Medication Sig Dispense Refill   apixaban (ELIQUIS) 5 MG TABS tablet Take 1 tablet (5 mg total) by mouth 2 (two) times daily. 60 tablet 11   cephALEXin (KEFLEX) 500 MG capsule Take 1 capsule (500 mg total) by mouth daily. Take 1 at bedtime every day. 30 capsule 2   hydrOXYzine (ATARAX) 25 MG tablet Take 25 mg by mouth 2 (two) times daily.     lanthanum (FOSRENOL) 500 MG chewable tablet Chew 500 mg by mouth 3 (three) times daily with meals.     No current facility-administered medications on file prior to visit.      Subjective: 36 Y O male with PMH Of  Morbid Obesity/OSA, HS,  Biventricular CHF, HTN, ESRD on HD, GERD, A fib/flutter here for hospital follow-up in the setting of MSSA bacteremia ( CLABSI, in the setting of HD catheter, removed) left SI joint septic arthritis/sacral osteomyelitis/iliacus muscle abscess.  Blood cultures cleared on 9/25.  10/11 TEE with no evidence of endocarditis, EF 20%, not LVAD or transplant candidate due to obesity.  Has completed prolonged IV cefazolin with HD which was transitioned to p.o. cephalexin on 11/29 with plan for follow-up on repeat MRI left hip.  Interim Admitted to [facility] from 1/19-1/21 for volume overload, treated with diuresis and dialysis. Admitted 1/24 -1/28 at [facility] for worsening shortness of breath and inability to access his dialysis catheter, tunneled catheter exchanged on 1/26, taken to the OR for left brachial artery to cephalic vein AV fistula creation with vascular on 1/27.  Discharge after having dialysis on 1/28  Today  Taking cephalexin once a day without missing doses. Able to walk but not able to run and speed walk.. he drives city bus. Able to cook at home. Has chronic cough which is sometimes followed by vomiting, non productive. Denies fevers, chills, sweats. Denies Nausea, vomiting and diarrhea. When he coughs, all joints hurt. Left hip pain is 5/10, able to sleep. He felt infection coming back when he had AV fistula placed. He was little reluctant about when discussed about discontinuing cephalexin today as he does not want to  have the infection coming back again.  Review of Systems: ROS All systems reviewed and negative except as above   Past Medical History:  Diagnosis Date   Biventricular congestive heart failure ([place])    Last Echo 11/2019 at [facility] reveals EF 20%   Class 3 severe obesity due to excess calories with serious comorbidity and body mass index (BMI) of 50.0 to 59.9 in adult [facility]) 02/26/2020   Essential hypertension 02/26/2020   GERD without esophagitis 02/26/2020    Hidradenitis suppurativa 02/26/2020   OSA (obstructive sleep apnea) 02/26/2020   Prediabetes 02/26/2020   Renal disorder    Past Surgical History:  Procedure Laterality Date   ABSCESS DRAINAGE     AV FISTULA PLACEMENT Left 08/21/2021   Procedure: LEFT ARM ARTERIOVENOUS (AV) FISTULA.;  Surgeon: Serafina Mitchell, MD;  Location: [facility];  Service: Vascular;  Laterality: Left;   IR FLUORO GUIDE CV LINE RIGHT  03/10/2021   IR FLUORO GUIDE CV LINE RIGHT  04/22/2021   IR FLUORO GUIDE CV LINE RIGHT  08/20/2021   IR US GUIDE VASC ACCESS RIGHT  03/10/2021   IR US GUIDE VASC ACCESS RIGHT  04/22/2021   RIGHT HEART CATH N/A 03/06/2021   Procedure: RIGHT HEART CATH;  Surgeon: Jolaine Artist, MD;  Location: [facility] CV LAB;  Service: Cardiovascular;  Laterality: N/A;   RIGHT/LEFT HEART CATH AND CORONARY ANGIOGRAPHY N/A 03/04/2020   Procedure: RIGHT/LEFT HEART CATH AND CORONARY ANGIOGRAPHY;  Surgeon: Larey Dresser, MD;  Location: [place] CV LAB;  Service: Cardiovascular;  Laterality: N/A;   TEE WITHOUT CARDIOVERSION N/A 05/05/2021   Procedure: TRANSESOPHAGEAL ECHOCARDIOGRAM (TEE);  Surgeon: Larey Dresser, MD;  Location: [facility] ENDOSCOPY;  Service: Cardiovascular;  Laterality: N/A;   TEMPORARY DIALYSIS CATHETER  03/06/2021   Procedure: TEMPORARY DIALYSIS CATHETER;  Surgeon: Jolaine Artist, MD;  Location: [place] CV LAB;  Service: Cardiovascular;;     Social History   Tobacco Use   Smoking status: Former    Packs/day: 1.00    Types: Cigarettes    Quit date: 2019    Years since quitting: 4.0   Smokeless tobacco: Former   Tobacco comments:    quit in 2019  Substance Use Topics   Alcohol use: Never   Drug use: Never    Family History  Problem Relation Age of Onset   Heart disease Mother    Hypertension Mother    Pulmonary Hypertension Mother    Drug abuse Father        died due to Heroin overdose    Allergies  Allergen Reactions   Coreg [Carvedilol] Shortness Of Breath and  Diarrhea    Wheezing    Heparin Other (See Comments)    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, SRA positive    Health Maintenance  Topic Date Due   COVID-19 Vaccine (1) Never done   FOOT EXAM  Never done   OPHTHALMOLOGY EXAM  Never done   URINE MICROALBUMIN  Never done   TETANUS/TDAP  Never done   INFLUENZA VACCINE  02/23/2021   HEMOGLOBIN A1C  02/16/2022   Hepatitis C Screening  Completed   HIV Screening  Completed   HPV VACCINES  Aged Out    Objective: Pulse (!) 108    Temp 98.1 F (36.7 C) (Temporal)    Ht 6' (1.829 m)    Wt (!) 426 lb (193.2 kg)    SpO2 90%    BMI 57.78 kg/m    Physical Exam Constitutional:  Appearance: Normal appearance. Morbidly obese  HENT:     Head: Normocephalic and atraumatic.      Mouth: Mucous membranes are moist.  Eyes:    Conjunctiva/sclera: Conjunctivae normal.     Pupils: Pupils are equal, round  Cardiovascular:     Rate and Rhythm: Normal rate     Heart sounds: distant heart sounds   Pulmonary:     Effort: mild SOB while speaking long sentences     Breath sounds: distant breath sounds  Abdominal:     General: Non distended     Palpations: soft.   Musculoskeletal:        General: Normal range of motion.   Skin:    General: Skin is warm and dry.     Comments: Rt chest HD catheter - OK, Left upper arm AV fistula - OK   Neurological:     General: grossly non focal     Mental Status: awake, alert and oriented to person, place, and time.   Psychiatric:        Mood and Affect: Mood normal.   Lab Results Lab Results  Component Value Date   WBC 7.2 08/21/2021   HGB 9.4 (L) 08/21/2021   HCT 29.4 (L) 08/21/2021   MCV 100.0 08/21/2021   PLT 225 08/21/2021    Lab Results  Component Value Date   CREATININE 7.57 (H) 08/22/2021   BUN 40 (H) 08/22/2021   NA 134 (L) 08/22/2021   K 4.1 08/22/2021   CL 98 08/22/2021   CO2 22 08/22/2021    Lab Results  Component Value Date   ALT 362 (H) 08/21/2021   AST 297 (H) 08/21/2021    ALKPHOS 106 08/21/2021   BILITOT 1.6 (H) 08/21/2021    Lab Results  Component Value Date   TRIG 137 07/29/2020   No results found for: LABRPR, RPRTITER No results found for: HIV1RNAQUANT, HIV1RNAVL, CD4TABS  TEE 05/05/21 IMPRESSIONS Left ventricular ejection fraction, by estimation, is 20%. The left ventricle has severely decreased function. The left ventricle demonstrates global hypokinesis. The left ventricular internal cavity size was moderately dilated. There is mild left ventricular hypertrophy. 1. Right ventricular systolic function is moderately reduced. The right ventricular size is mildly enlarged. 2. Left atrial size was moderately dilated. No left atrial/left atrial appendage thrombus was detected. 3. 4. No PFO/ASD by color doppler. 5. HD catheter in SVC, no vegetation noted. 6. Mild TR, peak RV-RA gradient 43 mmHg. No TV vegetation. 7. No PV vegetation. No AoV vegetation. The aortic valve is tricuspid. Aortic valve regurgitation is not visualized. No aortic stenosis is present. 8. No MV vegetation. The mitral valve is normal in structure. Mild mitral valve regurgitation. No evidence of mitral stenosis. 9. 10. No evidence for endocarditis.  TTE 04/18/21 Left ventricular ejection fraction, by estimation, is 25 to 30%. The left ventricle has severely decreased function. The left ventricle demonstrates global hypokinesis. The left ventricular internal cavity size was moderately dilated. Left ventricular diastolic parameters are indeterminate. 1. Right ventricular systolic function is normal. The right ventricular size is normal. There is moderately elevated pulmonary artery systolic pressure. The estimated right ventricular systolic pressure is 87.6 mmHg. 2. The mitral valve is normal in structure. Trivial mitral valve regurgitation. No evidence of mitral stenosis. 3. The aortic valve is normal in structure. Aortic valve regurgitation is not visualized.  No aortic stenosis is present. 4. The inferior vena cava is normal in size with greater than 50% respiratory variability, suggesting right atrial pressure of  3 mmHg. 5. Comparison(s): The left ventricular function has improved.  MRI Left Hip 07/02/21 FINDINGS: Bones/Joint/Cartilage   Marrow edema about the left SI joint persists and there is some erosive change at the joint. The extent of edema is somewhat greater than on the prior examination but it demonstrates new patchiness with normal fat signal present which are new since the prior exam and consistent with improved infection. Abscesses extending out of the joint into the left iliacus and piriformis muscles have resolved. No new focus of marrow signal abnormality is identified. There is no right SI joint effusion or hip effusion.   Ligaments   Intact.   Muscles and Tendons   Intact.  No abscess or other new abnormality.   Soft tissues   Negative.   IMPRESSION: Improved appearance of left SI joint septic arthritis. Marrow edema and some erosive change are seen about the joint. Abscesses extending out of the joint have resolved   Problem List Items Addressed This Visit       Musculoskeletal and Integument   Staphylococcal arthritis ([place])   Sacral osteomyelitis ([place])     Other   Abscess of iliac fossa   Medication monitoring encounter - Primary   Relevant Orders   Comprehensive metabolic panel   CBC   C-reactive protein   Sedimentation rate   Obesity   Assessment/Plan  Left SI joint septic arthritis/Sacral Osteomyelitis ( MSSA) Iliacus muscle abscess ESRD on HD - RT chest HD catheter, newly placed Left arm AVF CHF Morbid Obesity    Continue cephalexin as is  pending Labs today  Will consider discontinuing cephalexin if labs  OK from today given clinical improvement and improved MRI Hip findings Fu in 2 weeks  I have personally spent 48 minutes involved in face-to-face and non-face-to-face activities  for this patient on the day of the visit including review of prior medical records, staff time, discussion of plan and counseling of the patient  Wilber Oliphant, [facility] for [facility] 08/25/2021, 8:57 AM

## 2021-08-25 NOTE — Telephone Encounter (Signed)
-----   Message from Madalyn Rob, MD sent at 08/22/2021 12:00 PM EST ----- Regarding: Hospital follow up in 1-2 weeks Please schedule hospital follow up in 1-2 weeks for patient.

## 2021-08-26 ENCOUNTER — Encounter: Payer: Self-pay | Admitting: Infectious Diseases

## 2021-08-26 ENCOUNTER — Ambulatory Visit (INDEPENDENT_AMBULATORY_CARE_PROVIDER_SITE_OTHER): Payer: BC Managed Care – PPO | Admitting: Infectious Diseases

## 2021-08-26 ENCOUNTER — Other Ambulatory Visit: Payer: Self-pay

## 2021-08-26 VITALS — HR 108 | Temp 98.1°F | Ht 72.0 in | Wt >= 6400 oz

## 2021-08-26 DIAGNOSIS — Z5181 Encounter for therapeutic drug level monitoring: Secondary | ICD-10-CM

## 2021-08-26 DIAGNOSIS — M4628 Osteomyelitis of vertebra, sacral and sacrococcygeal region: Secondary | ICD-10-CM | POA: Insufficient documentation

## 2021-08-26 DIAGNOSIS — M0000 Staphylococcal arthritis, unspecified joint: Secondary | ICD-10-CM | POA: Insufficient documentation

## 2021-08-26 DIAGNOSIS — Z0000 Encounter for general adult medical examination without abnormal findings: Secondary | ICD-10-CM | POA: Insufficient documentation

## 2021-08-26 DIAGNOSIS — I5082 Biventricular heart failure: Secondary | ICD-10-CM | POA: Diagnosis not present

## 2021-08-26 DIAGNOSIS — K3533 Acute appendicitis with perforation and localized peritonitis, with abscess: Secondary | ICD-10-CM | POA: Insufficient documentation

## 2021-08-26 DIAGNOSIS — E669 Obesity, unspecified: Secondary | ICD-10-CM | POA: Insufficient documentation

## 2021-08-26 DIAGNOSIS — I5023 Acute on chronic systolic (congestive) heart failure: Secondary | ICD-10-CM | POA: Diagnosis not present

## 2021-08-27 ENCOUNTER — Telehealth (HOSPITAL_COMMUNITY): Payer: Self-pay

## 2021-08-27 ENCOUNTER — Telehealth: Payer: Self-pay

## 2021-08-27 DIAGNOSIS — L298 Other pruritus: Secondary | ICD-10-CM | POA: Diagnosis not present

## 2021-08-27 DIAGNOSIS — N2581 Secondary hyperparathyroidism of renal origin: Secondary | ICD-10-CM | POA: Diagnosis not present

## 2021-08-27 DIAGNOSIS — T8249XA Other complication of vascular dialysis catheter, initial encounter: Secondary | ICD-10-CM | POA: Diagnosis not present

## 2021-08-27 DIAGNOSIS — N186 End stage renal disease: Secondary | ICD-10-CM | POA: Diagnosis not present

## 2021-08-27 DIAGNOSIS — Z992 Dependence on renal dialysis: Secondary | ICD-10-CM | POA: Diagnosis not present

## 2021-08-27 LAB — COMPREHENSIVE METABOLIC PANEL
AG Ratio: 1.1 (calc) (ref 1.0–2.5)
ALT: 113 U/L — ABNORMAL HIGH (ref 9–46)
AST: 25 U/L (ref 10–40)
Albumin: 3.8 g/dL (ref 3.6–5.1)
Alkaline phosphatase (APISO): 161 U/L — ABNORMAL HIGH (ref 36–130)
BUN/Creatinine Ratio: 8 (calc) (ref 6–22)
BUN: 76 mg/dL — ABNORMAL HIGH (ref 7–25)
CO2: 22 mmol/L (ref 20–32)
Calcium: 9.3 mg/dL (ref 8.6–10.3)
Chloride: 101 mmol/L (ref 98–110)
Creat: 9.17 mg/dL — ABNORMAL HIGH (ref 0.60–1.26)
Globulin: 3.4 g/dL (calc) (ref 1.9–3.7)
Glucose, Bld: 116 mg/dL — ABNORMAL HIGH (ref 65–99)
Potassium: 3.8 mmol/L (ref 3.5–5.3)
Sodium: 136 mmol/L (ref 135–146)
Total Bilirubin: 0.8 mg/dL (ref 0.2–1.2)
Total Protein: 7.2 g/dL (ref 6.1–8.1)

## 2021-08-27 LAB — CBC
HCT: 29.3 % — ABNORMAL LOW (ref 38.5–50.0)
Hemoglobin: 9.6 g/dL — ABNORMAL LOW (ref 13.2–17.1)
MCH: 31.5 pg (ref 27.0–33.0)
MCHC: 32.8 g/dL (ref 32.0–36.0)
MCV: 96.1 fL (ref 80.0–100.0)
MPV: 9.9 fL (ref 7.5–12.5)
Platelets: 286 10*3/uL (ref 140–400)
RBC: 3.05 10*6/uL — ABNORMAL LOW (ref 4.20–5.80)
RDW: 17.5 % — ABNORMAL HIGH (ref 11.0–15.0)
WBC: 8.1 10*3/uL (ref 3.8–10.8)

## 2021-08-27 LAB — SEDIMENTATION RATE: Sed Rate: 31 mm/h — ABNORMAL HIGH (ref 0–15)

## 2021-08-27 LAB — C-REACTIVE PROTEIN: CRP: 14.5 mg/L — ABNORMAL HIGH (ref <8.0)

## 2021-08-27 NOTE — Telephone Encounter (Addendum)
CRITICAL VALUE STICKER  CRITICAL VALUE: Creatine 9.17  RECEIVER (on-site recipient of call):Eugenia Mcalpine   DATE & TIME NOTIFIED: 08/27/21 at 9:49am  MESSENGER (representative from lab):[facility]  MD NOTIFIED: Dr. West Bali  TIME OF NOTIFICATION:9:51am  RESPONSE: per Dr. West Bali; labs noted at 10:02am and patient is ESRD on HD  [place]

## 2021-08-27 NOTE — Telephone Encounter (Addendum)
Called and left a voice message to confirm/remind patient of their appointment at the [facility] on 08/28/21.

## 2021-08-27 NOTE — Telephone Encounter (Signed)
-----   Message from Rosiland Oz, MD sent at 08/27/2021 10:02 AM EST ----- Regarding: Lab fu Labs noted. Patient is ESRD on HD.

## 2021-08-28 ENCOUNTER — Encounter (HOSPITAL_COMMUNITY): Payer: BC Managed Care – PPO

## 2021-08-28 ENCOUNTER — Encounter (HOSPITAL_COMMUNITY): Payer: Self-pay

## 2021-08-28 NOTE — Progress Notes (Incomplete)
PCP: [name], MD Cardiology: Dr. [name]  HPI: 36 y.o. with history of cardiomyopathy and obesity.  He has been known to have a cardiomyopathy (biventricular failure) since back in 2019, echo in 2019 with EF 20% [facility]).  He has been followed in the [facility] system.  Admitted to [facility] in 0/93 with A/C Systolic HF. Diuresed with IV lasix and transitioned to torsemide. Placed on milrinone with low output and had RHC/LHC. Cath showed no CAD and volume overload.  Blood gas revealed a CO2 level elevated to 78. CPAP was initiated.   He was unable to tolerate Coreg due to wheezing and diarrhea.   Echo in 11/21 showed EF 20-25%, RV poorly visualized.  He was seen by EP, thought to be too large for cardiac MRI.   Patient was admitted in 7/22 with cardiogenic shock and AKI.  He ended up on CVVH.  He had a prolonged hospitalization for volume removal and titration off inotropes/pressors.  Eventually he was able to transition to [facility] and was discharged. Hospitalization was complicated by AF with RVR as well as HIT.  He was cardioverted several times.  Echo in 7/22 showed EF < 20%, severe LV enlargement, normal RV.   Patient returns for followup of CHF, ESRD, and PAF.  He is doing well currently.  BP stable today, denies lightheadedness.  He remains on midodrine.  He does not have dyspnea with walking on flat ground.  He is wearing oxygen.  He needs CPAP at home.  Weight is remaining stable with HD.  No chest pain.  He wants to go back to work.   Labs (10/21): K 3.7, creatinine 1.07  ECG (personally reviewed): NSR, PVCs, right axis deviation, nonspecific T wave flattening.  PMH: 1. Hidradenitis suppuritiva 2. GERD 3. OHS/OSA  4. Diabetes 5. Morbid obesity 6. Fe deficiency anemia 7. Chronic systolic CHF: Nonischemic cardiomyopathy.  No history of ETOH or drugs.  +ANA.  No strong FH of cardiomyopathy.  - Echo (10/19, [facility]): EF 20%.  - Echo (5/21, [facility]): EF 20% -  Echo (8/21): EF < 20%, moderate LV dilation, severely decreased RV function with severe RV dilation, severe biatrial enlargement, mild MR.  - LHC/RHC (8/21): No significant coronary disease; mean RA 19, PA 71/20 mean 39, mean PCWP 23, CI 2.1, PVR 2.5 WU.  - Echo (11/21): EF 20-25%, RV poorly visualized.  - Echo (7/22): EF < 20%, severely dilated LV.  8. Prior smoker.  9. ESRD 10. Atrial fibrillation: Paroxysmal.  11. History of HIT.   ROS: All systems negative except as listed in HPI, PMH and Problem List.  Social History   Socioeconomic History   Marital status: Single    Spouse name: Not on file   Number of children: Not on file   Years of education: Not on file   Highest education level: Not on file  Occupational History   Not on file  Tobacco Use   Smoking status: Former    Packs/day: 1.00    Types: Cigarettes    Quit date: 2019    Years since quitting: 4.0   Smokeless tobacco: Former   Tobacco comments:    quit in 2019  Substance and Sexual Activity   Alcohol use: Never   Drug use: Never   Sexual activity: Not on file  Other Topics Concern   Not on file  Social History Narrative   Not on file   Social Determinants of Health   Financial Resource Strain:  Low Risk    Difficulty of Paying Living Expenses: Not very hard  Food Insecurity: Food Insecurity Present   Worried About [place] in the Last Year: Never true   Ran Out of Food in the Last Year: Sometimes true  Transportation Needs: No Transportation Needs   Lack of Transportation (Medical): No   Lack of Transportation (Non-Medical): No  Physical Activity: Not on file  Stress: Not on file  Social Connections: Not on file  Intimate Partner Violence: Not on file   Family History  Problem Relation Age of Onset   Heart disease Mother    Hypertension Mother    Pulmonary Hypertension Mother    Drug abuse Father        died due to Heroin overdose    Past Medical History:  Diagnosis Date    Biventricular congestive heart failure ([place])    Last Echo 11/2019 at [facility] reveals EF 20%   Class 3 severe obesity due to excess calories with serious comorbidity and body mass index (BMI) of 50.0 to 59.9 in adult [facility] 02/26/2020   Essential hypertension 02/26/2020   GERD without esophagitis 02/26/2020   Hidradenitis suppurativa 02/26/2020   OSA (obstructive sleep apnea) 02/26/2020   Prediabetes 02/26/2020   Renal disorder     Current Outpatient Medications  Medication Sig Dispense Refill   apixaban (ELIQUIS) 5 MG TABS tablet Take 1 tablet (5 mg total) by mouth 2 (two) times daily. 60 tablet 11   cephALEXin (KEFLEX) 500 MG capsule Take 1 capsule (500 mg total) by mouth daily. Take 1 at bedtime every day. 30 capsule 2   hydrOXYzine (ATARAX) 25 MG tablet Take 25 mg by mouth 2 (two) times daily.     lanthanum (FOSRENOL) 500 MG chewable tablet Chew 500 mg by mouth 3 (three) times daily with meals.     No current facility-administered medications for this visit.    There were no vitals filed for this visit.  Wt Readings from Last 3 Encounters:  08/26/21 (!) 193.2 kg (426 lb)  08/22/21 (!) 191 kg (421 lb 1.3 oz)  06/23/21 (!) 185.5 kg (409 lb)    PHYSICAL EXAM: General: NAD, obese.  Neck: Thick.  No JVD, no thyromegaly or thyroid nodule.  Lungs: Clear to auscultation bilaterally with normal respiratory effort. CV: Nondisplaced PMI.  Heart regular S1/S2, no S3/S4, no murmur.  Trace edema.  No carotid bruit.  Normal pedal pulses.  Abdomen: Soft, nontender, no hepatosplenomegaly, no distention.  Skin: Intact without lesions or rashes.  Neurologic: Alert and oriented x 3.  Psych: Normal affect. Extremities: No clubbing or cyanosis.  HEENT: Normal.    ASSESSMENT & PLAN: 1. Chronic Biventricular Heart Failure:  Known NICM since 2019, previously followed by [facility].  Echo 8/21 EF < 20%, moderate LV dilation, severely decreased RV function, severe biatrial enlargement, mild MR. No  history of ETOH/drugs.  No FH of cardiomyopathy (mother with [place]).  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  He is too large for cardiac MRI. RHC/LHC on 03/04/20 with no CAD low CI at 2.1. Evaluated by EP but not a candidate for ICD given BMI.  Echo in 7/22 with EF < 20%, RV poorly visualized.  Developed cardiogenic shock 7/22 admission requiring pressors/inotropes and had AKI progressing to ESRD.  He titrated off inotropes/pressors.  Now on midodrine 20 mg tid and stable BP.   - Continue HD for volume control, he is tolerating.  - Continue current  midodrine for now, hopefully can decrease in the future.  - Refer for cardiac rehab.   2. Atrial fibrillation: Paroxysmal.  DCCV several times in 8/22 admission.    - Continue amiodarone 200 daily.  Check LFTs and TSH, needs regular eye exam while on amiodarone.  - Continue Eliquis.  3. ESRD: So far, tolerating HD and getting fluid off effectively.    4. OHS/OSA: Using 2 L home oxygen.  He needs CPAP.   - I will see if Dr. Radford Pax can help with this, he says that he did a sleep study in the past and is supposed to be on CPAP.   5. Super Morbid Obesity: I will refer to pharmacy clinic for semaglutide.  He is working on a diet.   Followup in 6 wks with APP.   [place] 08/28/2021

## 2021-08-29 DIAGNOSIS — N2581 Secondary hyperparathyroidism of renal origin: Secondary | ICD-10-CM | POA: Diagnosis not present

## 2021-08-29 DIAGNOSIS — T8249XA Other complication of vascular dialysis catheter, initial encounter: Secondary | ICD-10-CM | POA: Diagnosis not present

## 2021-08-29 DIAGNOSIS — N186 End stage renal disease: Secondary | ICD-10-CM | POA: Diagnosis not present

## 2021-08-29 DIAGNOSIS — L298 Other pruritus: Secondary | ICD-10-CM | POA: Diagnosis not present

## 2021-08-29 DIAGNOSIS — Z992 Dependence on renal dialysis: Secondary | ICD-10-CM | POA: Diagnosis not present

## 2021-09-01 DIAGNOSIS — L298 Other pruritus: Secondary | ICD-10-CM | POA: Diagnosis not present

## 2021-09-01 DIAGNOSIS — T8249XA Other complication of vascular dialysis catheter, initial encounter: Secondary | ICD-10-CM | POA: Diagnosis not present

## 2021-09-01 DIAGNOSIS — N2581 Secondary hyperparathyroidism of renal origin: Secondary | ICD-10-CM | POA: Diagnosis not present

## 2021-09-01 DIAGNOSIS — Z992 Dependence on renal dialysis: Secondary | ICD-10-CM | POA: Diagnosis not present

## 2021-09-01 DIAGNOSIS — N186 End stage renal disease: Secondary | ICD-10-CM | POA: Diagnosis not present

## 2021-09-02 ENCOUNTER — Inpatient Hospital Stay (HOSPITAL_COMMUNITY)
Admission: EM | Admit: 2021-09-02 | Discharge: 2021-09-04 | DRG: 291 | Disposition: A | Discharge status: Home / Self Care | Payer: BC Managed Care – PPO | Attending: Internal Medicine | Admitting: Internal Medicine

## 2021-09-02 ENCOUNTER — Ambulatory Visit (INDEPENDENT_AMBULATORY_CARE_PROVIDER_SITE_OTHER): Payer: BC Managed Care – PPO | Admitting: Internal Medicine

## 2021-09-02 ENCOUNTER — Encounter (HOSPITAL_COMMUNITY): Payer: Self-pay | Admitting: Emergency Medicine

## 2021-09-02 ENCOUNTER — Inpatient Hospital Stay
Admission: AD | Admit: 2021-09-02 | Payer: BC Managed Care – PPO | Source: Ambulatory Visit | Admitting: Internal Medicine

## 2021-09-02 ENCOUNTER — Emergency Department (HOSPITAL_COMMUNITY): Payer: BC Managed Care – PPO

## 2021-09-02 ENCOUNTER — Ambulatory Visit (HOSPITAL_COMMUNITY)
Admit: 2021-09-02 | Discharge: 2021-09-02 | Disposition: A | Discharge status: Home / Self Care | Payer: BC Managed Care – PPO | Attending: Internal Medicine | Admitting: Internal Medicine

## 2021-09-02 ENCOUNTER — Other Ambulatory Visit: Payer: Self-pay

## 2021-09-02 VITALS — BP 124/83 | HR 104 | Wt >= 6400 oz

## 2021-09-02 DIAGNOSIS — Z87891 Personal history of nicotine dependence: Secondary | ICD-10-CM | POA: Diagnosis not present

## 2021-09-02 DIAGNOSIS — E1122 Type 2 diabetes mellitus with diabetic chronic kidney disease: Secondary | ICD-10-CM | POA: Diagnosis present

## 2021-09-02 DIAGNOSIS — F329 Major depressive disorder, single episode, unspecified: Secondary | ICD-10-CM

## 2021-09-02 DIAGNOSIS — I480 Paroxysmal atrial fibrillation: Secondary | ICD-10-CM | POA: Diagnosis not present

## 2021-09-02 DIAGNOSIS — I509 Heart failure, unspecified: Secondary | ICD-10-CM | POA: Diagnosis not present

## 2021-09-02 DIAGNOSIS — Z20822 Contact with and (suspected) exposure to covid-19: Secondary | ICD-10-CM | POA: Diagnosis present

## 2021-09-02 DIAGNOSIS — D631 Anemia in chronic kidney disease: Secondary | ICD-10-CM | POA: Diagnosis present

## 2021-09-02 DIAGNOSIS — J810 Acute pulmonary edema: Secondary | ICD-10-CM | POA: Diagnosis not present

## 2021-09-02 DIAGNOSIS — N2581 Secondary hyperparathyroidism of renal origin: Secondary | ICD-10-CM | POA: Diagnosis present

## 2021-09-02 DIAGNOSIS — Z8249 Family history of ischemic heart disease and other diseases of the circulatory system: Secondary | ICD-10-CM

## 2021-09-02 DIAGNOSIS — E8770 Fluid overload, unspecified: Secondary | ICD-10-CM

## 2021-09-02 DIAGNOSIS — G4733 Obstructive sleep apnea (adult) (pediatric): Secondary | ICD-10-CM | POA: Diagnosis not present

## 2021-09-02 DIAGNOSIS — Z515 Encounter for palliative care: Secondary | ICD-10-CM

## 2021-09-02 DIAGNOSIS — Z91199 Patient's noncompliance with other medical treatment and regimen due to unspecified reason: Secondary | ICD-10-CM

## 2021-09-02 DIAGNOSIS — N186 End stage renal disease: Secondary | ICD-10-CM | POA: Diagnosis not present

## 2021-09-02 DIAGNOSIS — I132 Hypertensive heart and chronic kidney disease with heart failure and with stage 5 chronic kidney disease, or end stage renal disease: Secondary | ICD-10-CM | POA: Diagnosis not present

## 2021-09-02 DIAGNOSIS — I428 Other cardiomyopathies: Secondary | ICD-10-CM | POA: Diagnosis present

## 2021-09-02 DIAGNOSIS — I5020 Unspecified systolic (congestive) heart failure: Secondary | ICD-10-CM | POA: Diagnosis not present

## 2021-09-02 DIAGNOSIS — Z9115 Patient's noncompliance with renal dialysis: Secondary | ICD-10-CM | POA: Diagnosis not present

## 2021-09-02 DIAGNOSIS — K219 Gastro-esophageal reflux disease without esophagitis: Secondary | ICD-10-CM | POA: Diagnosis present

## 2021-09-02 DIAGNOSIS — Z992 Dependence on renal dialysis: Secondary | ICD-10-CM

## 2021-09-02 DIAGNOSIS — R0602 Shortness of breath: Secondary | ICD-10-CM | POA: Diagnosis not present

## 2021-09-02 DIAGNOSIS — I5023 Acute on chronic systolic (congestive) heart failure: Secondary | ICD-10-CM | POA: Diagnosis present

## 2021-09-02 DIAGNOSIS — Z7901 Long term (current) use of anticoagulants: Secondary | ICD-10-CM

## 2021-09-02 DIAGNOSIS — Z7189 Other specified counseling: Secondary | ICD-10-CM | POA: Diagnosis not present

## 2021-09-02 DIAGNOSIS — I517 Cardiomegaly: Secondary | ICD-10-CM | POA: Diagnosis not present

## 2021-09-02 DIAGNOSIS — I5084 End stage heart failure: Secondary | ICD-10-CM | POA: Diagnosis not present

## 2021-09-02 DIAGNOSIS — I959 Hypotension, unspecified: Secondary | ICD-10-CM | POA: Diagnosis not present

## 2021-09-02 DIAGNOSIS — R0902 Hypoxemia: Secondary | ICD-10-CM | POA: Diagnosis not present

## 2021-09-02 DIAGNOSIS — E8779 Other fluid overload: Secondary | ICD-10-CM | POA: Diagnosis not present

## 2021-09-02 DIAGNOSIS — Z6841 Body Mass Index (BMI) 40.0 and over, adult: Secondary | ICD-10-CM | POA: Diagnosis not present

## 2021-09-02 DIAGNOSIS — I120 Hypertensive chronic kidney disease with stage 5 chronic kidney disease or end stage renal disease: Secondary | ICD-10-CM | POA: Diagnosis not present

## 2021-09-02 LAB — CBC
HCT: 28.6 % — ABNORMAL LOW (ref 39.0–52.0)
Hemoglobin: 9.2 g/dL — ABNORMAL LOW (ref 13.0–17.0)
MCH: 31.8 pg (ref 26.0–34.0)
MCHC: 32.2 g/dL (ref 30.0–36.0)
MCV: 99 fL (ref 80.0–100.0)
Platelets: 248 10*3/uL (ref 150–400)
RBC: 2.89 MIL/uL — ABNORMAL LOW (ref 4.22–5.81)
RDW: 18.4 % — ABNORMAL HIGH (ref 11.5–15.5)
WBC: 7.9 10*3/uL (ref 4.0–10.5)
nRBC: 0 % (ref 0.0–0.2)

## 2021-09-02 LAB — COMPREHENSIVE METABOLIC PANEL
ALT: 38 U/L (ref 0–44)
AST: 24 U/L (ref 15–41)
Albumin: 3.2 g/dL — ABNORMAL LOW (ref 3.5–5.0)
Alkaline Phosphatase: 118 U/L (ref 38–126)
Anion gap: 15 (ref 5–15)
BUN: 61 mg/dL — ABNORMAL HIGH (ref 6–20)
CO2: 22 mmol/L (ref 22–32)
Calcium: 9 mg/dL (ref 8.9–10.3)
Chloride: 99 mmol/L (ref 98–111)
Creatinine, Ser: 7.91 mg/dL — ABNORMAL HIGH (ref 0.61–1.24)
GFR, Estimated: 8 mL/min — ABNORMAL LOW (ref >60)
Glucose, Bld: 142 mg/dL — ABNORMAL HIGH (ref 70–99)
Potassium: 4 mmol/L (ref 3.5–5.1)
Sodium: 136 mmol/L (ref 135–145)
Total Bilirubin: 1.9 mg/dL — ABNORMAL HIGH (ref 0.3–1.2)
Total Protein: 6.8 g/dL (ref 6.5–8.1)

## 2021-09-02 LAB — RESP PANEL BY RT-PCR (FLU A&B, COVID) ARPGX2
Influenza A by PCR: NEGATIVE
Influenza B by PCR: NEGATIVE
SARS Coronavirus 2 by RT PCR: NEGATIVE

## 2021-09-02 LAB — BRAIN NATRIURETIC PEPTIDE: B Natriuretic Peptide: 839.9 pg/mL — ABNORMAL HIGH (ref 0.0–100.0)

## 2021-09-02 IMAGING — DX DG CHEST 2V
2 series · 2 of 2 positions shown · non-contrast
Comparison: Chest x-ray dated [DATE]

CLINICAL DATA: Shortness of breath

EXAM:
CHEST - 2 VIEW

[x chest ap]
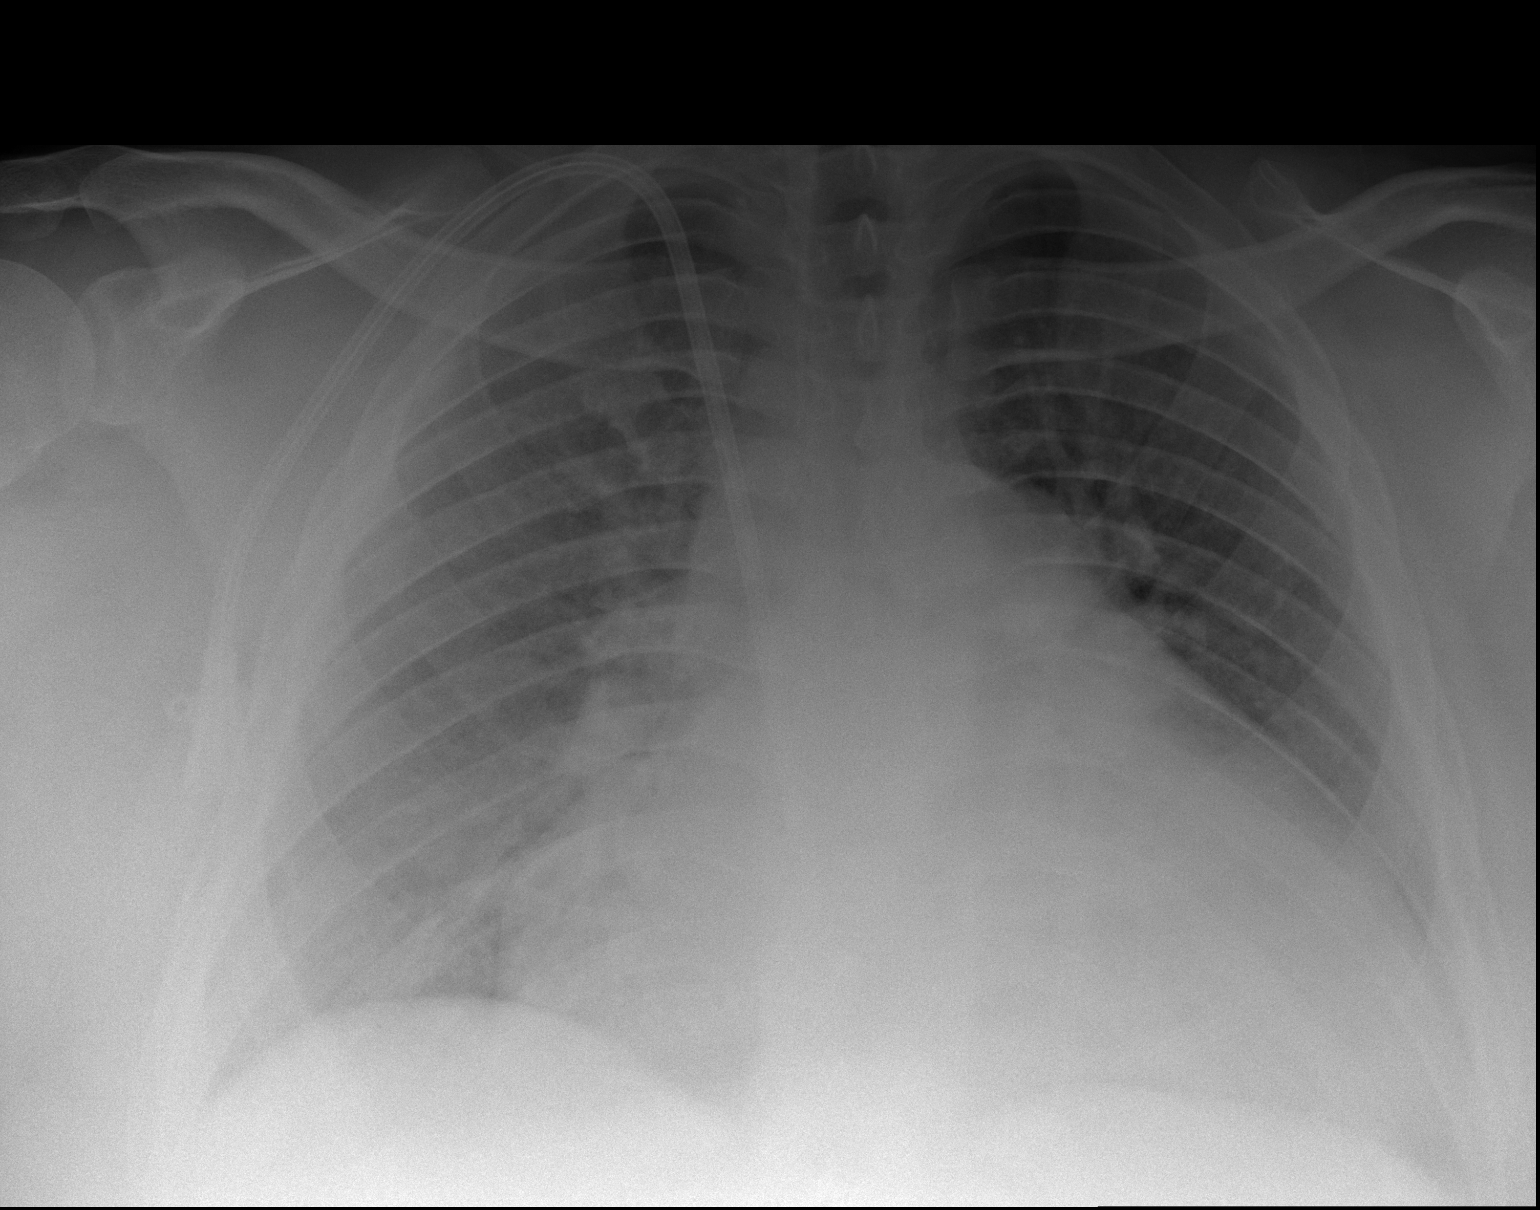

[w chest lat]
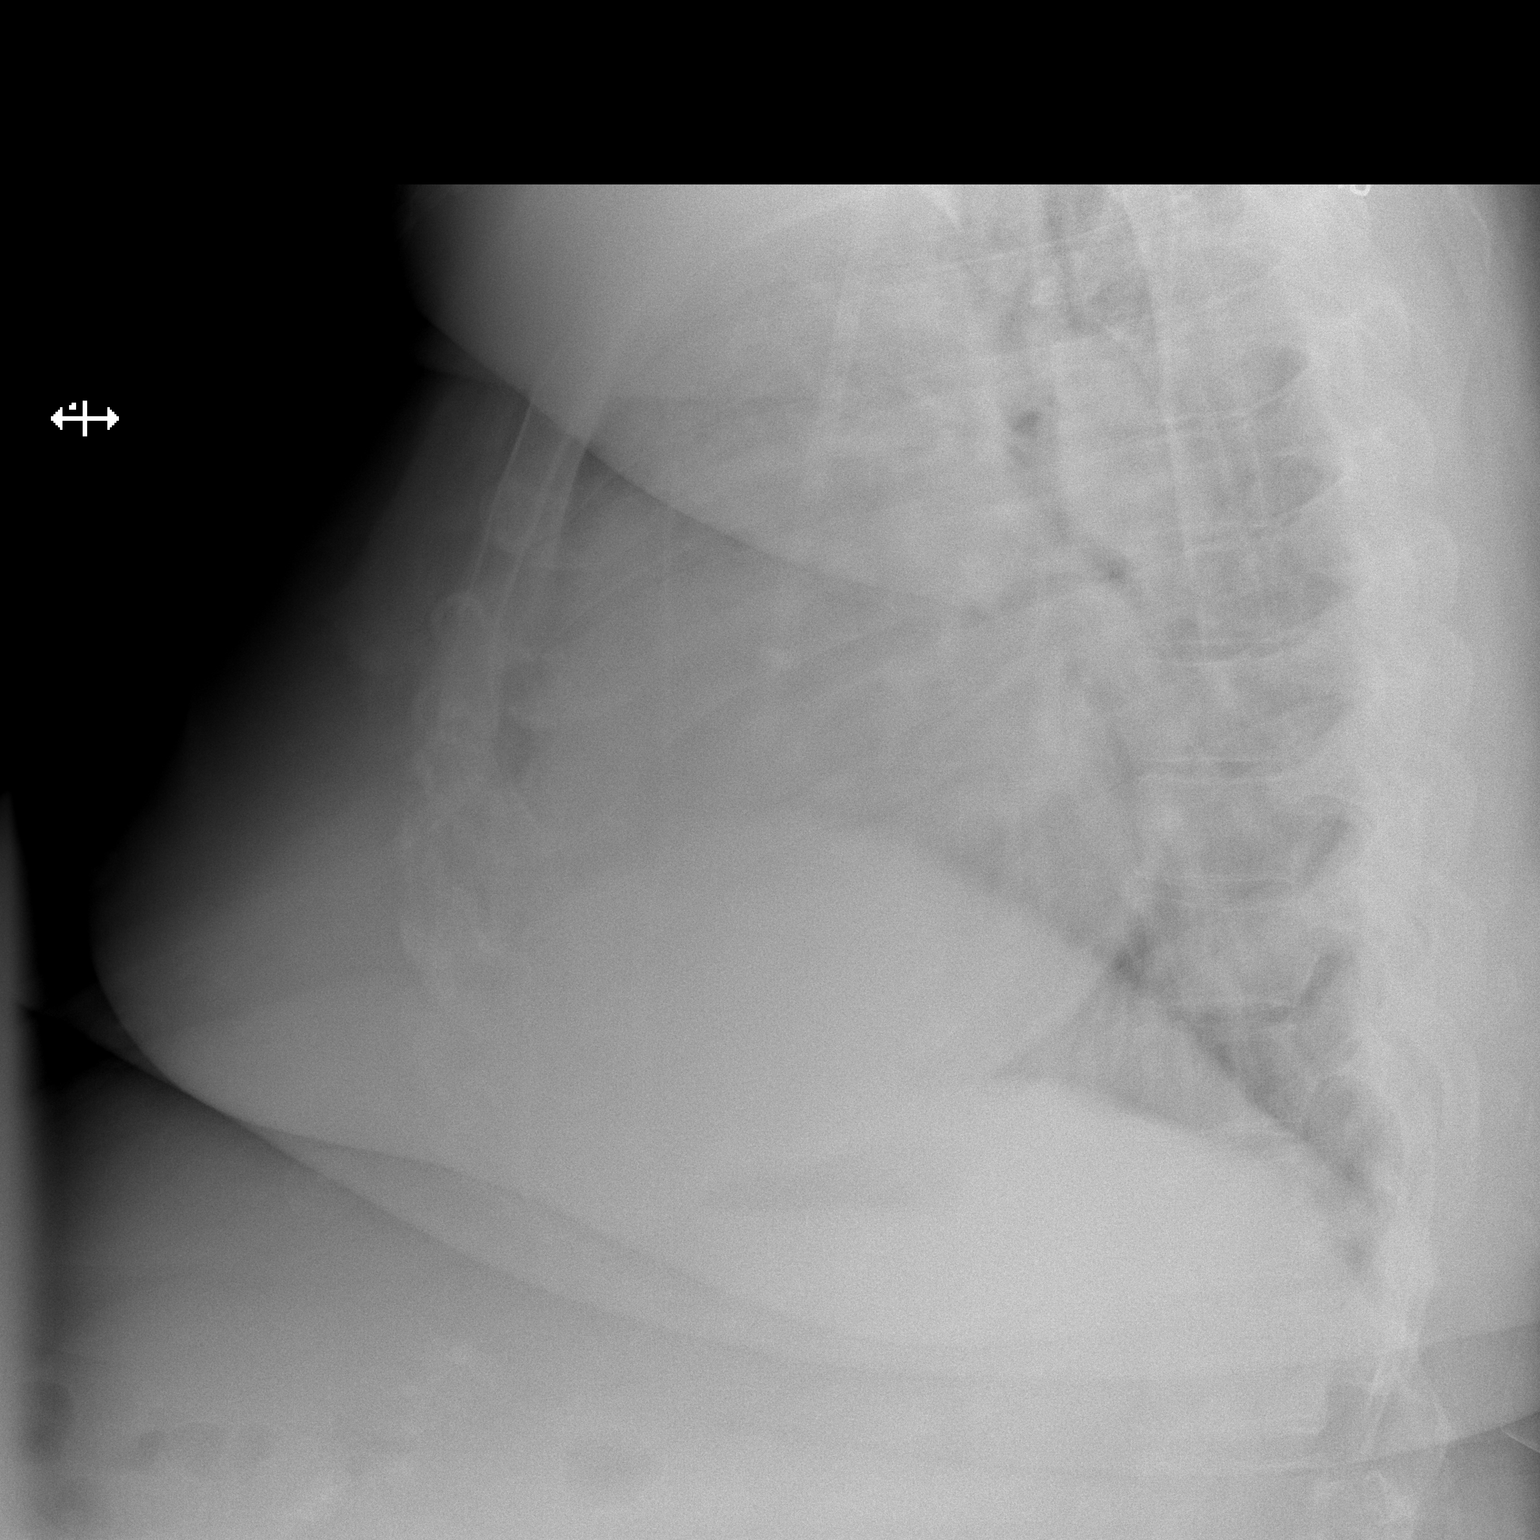

[2 of 2 positions shown; findings below may reference images not displayed]

FINDINGS: Enlarged cardiac and mediastinal contours. Right chest wall dialysis
catheter is unchanged position. Mild bilateral heterogeneous
pulmonary opacities. No pleural effusion or pneumothorax.
IMPRESSION: Cardiomegaly. Mild bilateral heterogeneous opacities, concerning for
pulmonary edema.

## 2021-09-02 MED ORDER — BENZONATATE 100 MG PO CAPS
200.0000 mg | ORAL_CAPSULE | Freq: Three times a day (TID) | ORAL | Status: DC | PRN
  Administered 2021-09-02 – 2021-09-04 (×3): 200 mg via ORAL
  Filled 2021-09-02 (×4): qty 2

## 2021-09-02 MED ORDER — MIDODRINE HCL 5 MG PO TABS
10.0000 mg | ORAL_TABLET | ORAL | Status: DC
  Administered 2021-09-03 (×2): 10 mg via ORAL
  Filled 2021-09-02 (×2): qty 2

## 2021-09-02 MED ORDER — APIXABAN 5 MG PO TABS
5.0000 mg | ORAL_TABLET | Freq: Two times a day (BID) | ORAL | Status: DC
  Administered 2021-09-03 (×3): 5 mg via ORAL
  Filled 2021-09-02 (×3): qty 1

## 2021-09-02 MED ORDER — CHLORHEXIDINE GLUCONATE CLOTH 2 % EX PADS: 6.0000 | MEDICATED_PAD | Freq: Every day | CUTANEOUS | Status: DC

## 2021-09-02 MED ORDER — DOXERCALCIFEROL 4 MCG/2ML IV SOLN
3.0000 ug | INTRAVENOUS | Status: DC
  Filled 2021-09-02: qty 2

## 2021-09-02 MED ORDER — HYDROXYZINE HCL 25 MG PO TABS
25.0000 mg | ORAL_TABLET | Freq: Two times a day (BID) | ORAL | Status: DC
  Administered 2021-09-03 (×3): 25 mg via ORAL
  Filled 2021-09-02 (×3): qty 1

## 2021-09-02 MED ORDER — ANTICOAGULANT SODIUM CITRATE 4% (200MG/5ML) IV SOLN
5.0000 mL | Status: DC | PRN
  Administered 2021-09-03 (×2): 5 mL
  Filled 2021-09-02 (×2): qty 5

## 2021-09-02 MED ORDER — LANTHANUM CARBONATE 500 MG PO CHEW
1000.0000 mg | CHEWABLE_TABLET | Freq: Three times a day (TID) | ORAL | Status: DC
  Administered 2021-09-02 – 2021-09-04 (×4): 1000 mg via ORAL
  Filled 2021-09-02 (×6): qty 2

## 2021-09-02 MED ORDER — CEPHALEXIN 500 MG PO CAPS
500.0000 mg | ORAL_CAPSULE | Freq: Every day | ORAL | Status: DC
  Administered 2021-09-02 – 2021-09-03 (×2): 500 mg via ORAL
  Filled 2021-09-02: qty 1
  Filled 2021-09-02: qty 2

## 2021-09-02 NOTE — ED Notes (Signed)
Patient expressed interest in palliative care stating that he would like to be comfortable and not alone when he dies.

## 2021-09-02 NOTE — Assessment & Plan Note (Signed)
[name] is presenting for hospital follow up for recent admission for hypervolemia in setting of HD catheter malfunction with AV fistula creation. He was diuresed and dialyzed during admission and discharged to continue dialysis at his usual dialysis center. Patient reports that he received two short sessions of dialysis on Saturday and Tuesday due to machine malfunctioning. Last dialysis session yesterday in which 4L fluid was pulled off. He reports dry weight of 175kg; however, was dialyzed to 191kg on hospital discharge. Patient reports feeling generally unwell with significant dyspnea on minimal exertion. He is on 1-2L oxygen at baseline; currently requiring 3L O2 in clinic. Patient noted to have 2+ pitting edema of bilateral lower extremities; fluctuance in abdominal pannus and decreased bibasilar breath sounds without wheezing or rhonchi. EKG obtained that was notable for sinus tachycardia and prolonged QTc.  Discussed with patient's outpatient dialysis center recommendation for dialysis treatment today; however, no availability today. At this time, will refer to ED for emergent HD session. Anticipate that he can resume his normal HD schedule tomorrow with ongoing attempts at volume removal as outpatient. Will schedule clinic follow up in 1 week.

## 2021-09-02 NOTE — ED Triage Notes (Signed)
Patient with history of dialysis sent to ED from internal medicine for evaluation of fluid overload, patient had new dialysis fistula created on 08/22/2021 and has only had two dialysis treatments since then. Patient alert and oriented at this time.

## 2021-09-02 NOTE — Consult Note (Signed)
[facility] Renal Consultation Note    Indication for Consultation:  Management of ESRD/hemodialysis; anemia, hypertension/volume and secondary hyperparathyroidism  PCP:Pcp, No  HPI: Phillip Young is a 36 y.o. male. ESRD on HD TTS at [facility].  Past medical history significant for HTN, HFrEF, EF 25-30%,  Afib, on Eliquis,  OSA, morbid obesity, GERD.  Of note patient is not compliant with fluid restrictions or prescribed dialysis regimen, often shortening treatments and 3-6L intradialytic weight gains.  He completed 3hr 14min yesterday, removed 6.1L and left 13kg over his dry weight.   Patient sent to the ED from his primary care.  Reports worsened shortness of breath, edema and weakness over the last week.  Significant dyspnea after walking about 5 steps to the point he feels he may collapse.  He uses oxygen at home intermittently and has had to use it daily.  Denies fever, chills, nausea, vomiting and diarrhea.  Admits to itching, fatigue, and intermittently productive cough.   Pertinent findings in workup include tachycardia, tachypnea, K 4.0, BUN 61, Scr 7.91, Hgb 9.2, negative respiratory panel and CXR with pulmonary edema. Patient being admitted for further management.   Past Medical History:  Diagnosis Date   Biventricular congestive heart failure ([place])    Last Echo 11/2019 at [facility] reveals EF 20%   Class 3 severe obesity due to excess calories with serious comorbidity and body mass index (BMI) of 50.0 to 59.9 in adult [facility]) 02/26/2020   Essential hypertension 02/26/2020   GERD without esophagitis 02/26/2020   Hidradenitis suppurativa 02/26/2020   OSA (obstructive sleep apnea) 02/26/2020   Prediabetes 02/26/2020   Renal disorder    Past Surgical History:  Procedure Laterality Date   ABSCESS DRAINAGE     AV FISTULA PLACEMENT Left 08/21/2021   Procedure: LEFT ARM ARTERIOVENOUS (AV) FISTULA.;  Surgeon: Serafina Mitchell, MD;  Location: [place];  Service: Vascular;   Laterality: Left;   IR FLUORO GUIDE CV LINE RIGHT  03/10/2021   IR FLUORO GUIDE CV LINE RIGHT  04/22/2021   IR FLUORO GUIDE CV LINE RIGHT  08/20/2021   IR US GUIDE VASC ACCESS RIGHT  03/10/2021   IR US GUIDE VASC ACCESS RIGHT  04/22/2021   RIGHT HEART CATH N/A 03/06/2021   Procedure: RIGHT HEART CATH;  Surgeon: Jolaine Artist, MD;  Location: [place] CV LAB;  Service: Cardiovascular;  Laterality: N/A;   RIGHT/LEFT HEART CATH AND CORONARY ANGIOGRAPHY N/A 03/04/2020   Procedure: RIGHT/LEFT HEART CATH AND CORONARY ANGIOGRAPHY;  Surgeon: Larey Dresser, MD;  Location: [place] CV LAB;  Service: Cardiovascular;  Laterality: N/A;   TEE WITHOUT CARDIOVERSION N/A 05/05/2021   Procedure: TRANSESOPHAGEAL ECHOCARDIOGRAM (TEE);  Surgeon: Larey Dresser, MD;  Location: [facility] ENDOSCOPY;  Service: Cardiovascular;  Laterality: N/A;   TEMPORARY DIALYSIS CATHETER  03/06/2021   Procedure: TEMPORARY DIALYSIS CATHETER;  Surgeon: Jolaine Artist, MD;  Location: [place] CV LAB;  Service: Cardiovascular;;   Family History  Problem Relation Age of Onset   Heart disease Mother    Hypertension Mother    Pulmonary Hypertension Mother    Drug abuse Father        died due to Heroin overdose   Social History:  reports that he quit smoking about 4 years ago. His smoking use included cigarettes. He smoked an average of 1 pack per day. He has quit using smokeless tobacco. He reports that he does not drink alcohol and does not use drugs. Allergies  Allergen  Reactions   Coreg [Carvedilol] Shortness Of Breath and Diarrhea    Wheezing    Heparin Other (See Comments)    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 Authorizing Provider  apixaban (ELIQUIS) 5 MG TABS tablet Take 1 tablet (5 mg total) by mouth 2 (two) times daily. 03/26/21  Yes Carollo, Farley Ly, NP  cephALEXin (KEFLEX) 500 MG capsule Take 1 capsule (500 mg total) by  mouth daily. Take 1 at bedtime every day. 08/04/21 11/02/21 Yes Vu, Rockey Situ, MD  hydrOXYzine (ATARAX) 25 MG tablet Take 25 mg by mouth 2 (two) times daily. 08/17/21  Yes [provider]  lanthanum (FOSRENOL) 500 MG chewable tablet Chew 500 mg by mouth 3 (three) times daily with meals.   Yes [provider]   Current Facility-Administered Medications  Medication Dose Route Frequency Provider Last Rate Last Admin   [START ON 09/03/2021] Chlorhexidine Gluconate Cloth 2 % PADS 6 each  6 each Topical Q0600 Mariah Harn, Ria Comment, [place]       Current Outpatient Medications  Medication Sig Dispense Refill   apixaban (ELIQUIS) 5 MG TABS tablet Take 1 tablet (5 mg total) by mouth 2 (two) times daily. 60 tablet 11   cephALEXin (KEFLEX) 500 MG capsule Take 1 capsule (500 mg total) by mouth daily. Take 1 at bedtime every day. 30 capsule 2   hydrOXYzine (ATARAX) 25 MG tablet Take 25 mg by mouth 2 (two) times daily.     lanthanum (FOSRENOL) 500 MG chewable tablet Chew 500 mg by mouth 3 (three) times daily with meals.     Labs: Basic Metabolic Panel: Recent Labs  Lab 09/02/21 1030  NA 136  K 4.0  CL 99  CO2 22  GLUCOSE 142*  BUN 61*  CREATININE 7.91*  CALCIUM 9.0   Liver Function Tests: Recent Labs  Lab 09/02/21 1030  AST 24  ALT 38  ALKPHOS 118  BILITOT 1.9*  PROT 6.8  ALBUMIN 3.2*   CBC: Recent Labs  Lab 09/02/21 1030  WBC 7.9  HGB 9.2*  HCT 28.6*  MCV 99.0  PLT 248   Studies/Results: DG Chest 2 View  Result Date: 09/02/2021 CLINICAL DATA:  Shortness of breath EXAM: CHEST - 2 VIEW COMPARISON:  Chest x-ray dated August 18, 2021 FINDINGS: Enlarged cardiac and mediastinal contours. Right chest wall dialysis catheter is unchanged position. Mild bilateral heterogeneous pulmonary opacities. No pleural effusion or pneumothorax. IMPRESSION: Cardiomegaly. Mild bilateral heterogeneous opacities, concerning for pulmonary edema. Electronically Signed   By: Yetta Glassman M.D.    On: 09/02/2021 11:48    ROS: All others negative except those listed in HPI.  Physical Exam: Vitals:   09/02/21 1055 09/02/21 1245 09/02/21 1430 09/02/21 1500  BP: (!) 115/101 (!) 123/92 116/74 113/80  Pulse: (!) 108 (!) 104 100 (!) 103  Resp: (!) 24 17 (!) 23 (!) 23  Temp: 99.2 F (37.3 C) 98.2 F (36.8 C)    TempSrc: Oral     SpO2: 100% 99% 99% 99%     General: morbidly obese male in NAD Head: NCAT sclera not icteric MMM Neck: Supple. No JVD appreciated d/t body habitus  Lungs: +scattered rhonchi, breath sounds decreased, increased WOB with conversation, audible wheeze Heart: +tachycardia. No murmur, rubs or gallops appreciated  Abdomen: soft, nontender, +BS, 1+ edema Lower extremities:1+ edema b/l Neuro: AAOx3. Moves all extremities spontaneously. Psych:  Responds to questions appropriately with a normal affect.  Dialysis Access: TDC, LU AVF maturing +b/t  Dialysis Orders:  TTS- [facility]  4hrs, BFR 450, DFR AF 1.5,  EDW 175kg, 4K/ 2.25Ca  Access: TDC, LU AVF maturing  Heparin NONE HIT+, use sodium citrate to lock catheter Mircera 60 mcg q2wks - last 76mcg on 08/06/21 Hectorol 3 mcg IV qHD    Assessment/Plan:  Volume overload/Pulmonary edema - 2/2 non compliance with HD/fluid restrictions.  Reminded of fluid restrictions.  Plan for HD today with max UF. Will likely need serial dialysis  ESRD -  on HD TTS.  Extra treatment ordered for today for volume removal.  HD again tomorrow per regular schedule.    Hypotension - Blood pressure in goal.  On midodrine with HD.  Anemia of CKD - Hgb 9.2, will order ESA with HD tomorrow.   Secondary Hyperparathyroidism -  Ca in goal. Check phos.  Continue VDRA and binders.   Nutrition - Renal diet w/strict fluid restrictions HFrEF EF 25-30%,   Afib - on eliquis OSA - uses bipap at night   Jen Mow, PA-C [employer] 09/02/2021, 3:58 PM

## 2021-09-02 NOTE — Patient Instructions (Signed)
[name],  It was a pleasure seeing you in clinic. Today we discussed:   Volume overload: Unfortunately, there is no availability at the [facility] today for dialysis. At this time, we recommend going to the ED for a dialysis session and then you can present to the [facility] tomorrow for your routine dialysis session.  Please schedule a visit in 1 week to be evaluated in clinic.   If you have any questions or concerns, please call our clinic at [phone] between 9am-5pm and after hours call [phone] and ask for the internal medicine resident on call. If you feel you are having a medical emergency please call 911.   Thank you, we look forward to helping you remain healthy!

## 2021-09-02 NOTE — Progress Notes (Signed)
° °  CC: hospital follow up  HPI:  [name] is a 36 y.o. male with PMHx as stated below presenting for hospital follow up. He was recently admitted on 1/19-1/28 in setting of hypervolemia secondary to malfunctioning HD catheter. Patient was diuresed and dialyzed during his hospitalization with creation of left brachiocephalic AV fistula. Patient was discharged on 1/28 and has had two dialysis sessions since discharge. However, notes that he continues to have significant fluid overload in setting of short dialysis sessions. He endorses significant dyspnea on minimal exertion and has increasing oxygen requirements. He notes generally feeling unwell and has not been able to work since Saturday.   Past Medical History:  Diagnosis Date   Biventricular congestive heart failure ([place])    Last Echo 11/2019 at [facility] reveals EF 20%   Class 3 severe obesity due to excess calories with serious comorbidity and body mass index (BMI) of 50.0 to 59.9 in adult [facility]) 02/26/2020   Essential hypertension 02/26/2020   GERD without esophagitis 02/26/2020   Hidradenitis suppurativa 02/26/2020   OSA (obstructive sleep apnea) 02/26/2020   Prediabetes 02/26/2020   Renal disorder    Review of Systems:  Negative except as stated in HPI.  Physical Exam:  There were no vitals filed for this visit. Physical Exam  Constitutional: Chronically ill appearing young obese male; mild respiratory distress  HENT: Normocephalic and atraumatic, moist mucous membranes Cardiovascular: Normal rate, regular rhythm, S1 and S2 present, distant heart sounds 2/2 body habitus; no m/r/g. Distal pulses intact Respiratory: Mild respiratory distress; no accessory muscle use; decreased bibasilar breath sounds; on 3L O2 GI: obese distended abdomen with fluctuant pannus; soft, nontender, hypoactive bowel sounds  Musculoskeletal: Normal bulk and tone.  2+ pitting edema of bilateral lower extremities  Neurological: Is alert and  oriented x4, no apparent focal deficits noted. Skin: Warm and dry.  L forearm AV fistula without signs of infection; HD port without signs of infection  Psychiatric: Normal mood and affect; behavior normal. Endorses feeling "tired of suffering"   Assessment & Plan:   See Encounters Tab for problem based charting.  Patient seen with Dr. Evette Doffing

## 2021-09-02 NOTE — H&P (Signed)
Date: 09/02/2021               Patient Name:  Phillip Young MRN: [id number]  DOB: [birth year] Age / Sex: 36 y.o., male   PCP: Pcp, No         Medical Service: Internal Medicine Teaching Service         Attending Physician: Dr. Dareen Piano     First Contact: Lajean Manes, MD Pager: [phone]  Second Contact: Rick Duff, MD Pager: [phone]       After Hours (After 5p/  First Contact Pager: [phone]  weekends / holidays): Second Contact Pager: [phone]    Chief Complaint: SHOB  History of Present Illness:  Phillip Young is a 36 y.o. male with a pertinent PMH of HTN, T2DM, paroxysmal fibrillation on Elqiuis, chronic HFrEF (EF 20%), OSA on CPAP, and ESRD on HD TTS presents to the ED after being re-directed from the [facility] clinic.  Patient was seen at the [facility] clinic today. He reported that he received two short sessions of dialysis on Saturday and Tuesday due to machine malfunctioning. Last dialysis session yesterday in which 4L fluid was pulled off. Patient reports feeling generally unwell with significant dyspnea on minimal exertion since Saturday. He is on 1-2L oxygen at baseline; currently requiring 3L O2 in clinic. Patient noted to have 2+ pitting edema of bilateral lower extremities; fluctuance in abdominal pannus and decreased bibasilar breath sounds without wheezing or rhonchi. EKG obtained that was notable for sinus tachycardia and prolonged QTc. He was directed to the ED for emergent HD session.   He reports that he drinks up to 2L of fluids. Continues to make some urine. He reports eating sandwiches and steak; ongoing dietary indiscretion.   ED workup revealing a BNP 840 and CXR with bilateral pulmonary edema. No electrolyte derangements. He was tachycardic and tachypneic with increased work of breathing and was placed on Bipap. Nephrology consulted, recommending admission for serial HD sessions.   Of note, pt was recently admitted to our service from 1/24- 1/28 for hypervolemia 2/2  malfunctioning HD catheter requiring catheter exchange and serial HD sessions. He also had a left brachial artery to cephalic vein AV fistula creation with vascular surgery on 08/21/2021, prior to discharge.    Meds: Current Meds  Medication Sig   apixaban (ELIQUIS) 5 MG TABS tablet Take 1 tablet (5 mg total) by mouth 2 (two) times daily.   cephALEXin (KEFLEX) 500 MG capsule Take 1 capsule (500 mg total) by mouth daily. Take 1 at bedtime every day.   hydrOXYzine (ATARAX) 25 MG tablet Take 25 mg by mouth 2 (two) times daily.   lanthanum (FOSRENOL) 500 MG chewable tablet Chew 500 mg by mouth 3 (three) times daily with meals.   Allergies: Allergies as of 09/02/2021 - Review Complete 09/02/2021  Allergen Reaction Noted   Coreg [carvedilol] Shortness Of Breath and Diarrhea 07/07/2020   Heparin Other (See Comments) 03/05/2021   Past Medical History:  Diagnosis Date   Biventricular congestive heart failure ([place])    Last Echo 11/2019 at [facility] reveals EF 20%   Class 3 severe obesity due to excess calories with serious comorbidity and body mass index (BMI) of 50.0 to 59.9 in adult [facility]) 02/26/2020   Essential hypertension 02/26/2020   GERD without esophagitis 02/26/2020   Hidradenitis suppurativa 02/26/2020   OSA (obstructive sleep apnea) 02/26/2020   Prediabetes 02/26/2020   Renal disorder    Family History:  Family History  Problem Relation Age of Onset  Heart disease Mother    Hypertension Mother    Pulmonary Hypertension Mother    Drug abuse Father        died due to Heroin overdose    Social History:   Lives with mother and uncle  Employment- drives a school bus   Denies tobacco, alcohol, and illicit drug use Per chart review: Quit smoking in Oct 2019, used to smoke about 6 cigars per day for about 2 years. IADLs/ADLs- can person independently at baseline   Review of Systems: A complete ROS was negative except as per HPI.    Physical Exam: Blood pressure 103/85, pulse  (!) 104, temperature 98.2 F (36.8 C), resp. rate (!) 29, SpO2 96 %.  Constitutional: alert, ill-appearing, and in moderate distress  Neck: difficult to assess JVD d/t body habitus  Cardiovascular: regular rhythm, tachycardic, no m/r/g, 1+ bilateral LE pitting edema  Pulmonary/Chest: increased work of breathing on 4L [place], bilateral crackles in lower lung fields.  Abdominal: 2+ abdominal edema present; non-tender to palpation, distended Neurological: A&O x 3, follows commands  Skin: cool and dry  EKG: sinus tachycardia    CXR: bilateral pulmonary edema   Assessment & Plan by Problem: Principal Problem:   Hypoxemia  Hypervolemia in the setting of incomplete HD sessions  ESRD on HD TTS Pt presenting with SHOB, DOE, and orthopnea in the setting of incomplete HD sessions and non-adherence to fluid restriction and dietary indiscretion. Last received HD yesterday, incomplete session. BMP elevated and CXR with pulmonary edema. Exam with 1+ LE edema but has abdominal edema with increased work of breathing. No concerning electrolyte abnormalities. Nephrology consulted in the ED, want to bring him in for serial HD sessions. - Nephrology following, appreciate assistance  - Serial HD per nephrology  - Midodrine with HD d/t hypotension  - Renal function panel  - CBC   Chronic HFrEF (EF 25-30%) Nonischemic cardiomyopathy Hx of HFrEF status post cardiogenic shock in September 2022. Echocardiogram in October 2022 show EF of 20%, nonischemic in nature. Could not get cardiac MRI due to body habitus. Patient is not a candidate for ICD either given BMI. - Volume managed controlled per nephrology with HD - Fluid restriction  - Strict I/O's and daily weights   Paroxysmal Afib Tachycardic 90-100's.  Continue Eliquis   GOC discussion Multiple hospitalizations for several co-morbidities, many of which are end stage. Follows with AHF team, and has had discussions surround hospice care. Pt reports that  may be interested in pursuing comfort care measures and "being comfortable". He has had conversations surrounding this with his mother. He requests palliative care consultation. He desires to be full code for now until speaking with palliative care team.   - Palliative care consulted, appreciate assistance   MSSA septic arthritis of left sacroiliac joint and sacral osteomyelitis (03/2021) Hx of MRSA infection (2018) Initially on IV antibiotics and transitioned to Keflex at d/c in 04/2021. Follows with Dr Vu/ ID and recent MRI showing improvement in septic arthritis and resolution of abscesses in joint. Had ID follow up on 08/26/21 and pt instructed to continue Keflex pending labs and office follow up in 2 weeks - Continue Keflex 500 mg qd    DM2 Last A1c 5.9 (1/25). Not on any diabetes medications.  - SSI - CBG monitoring   Itching  - Continue hydroxyzine    Anemia of chronic diease  Chronic and stable, Hgb 9.2 - ESA per nephrology     OSA on CPAP - Continue CPAP  Class 3 obesity  - Encourage weight loss    Best Practice: Diet: Heart Healthy  IVF: none  VTE: apixaban  Code: Full   Lajean Manes, MD  Internal Medicine Resident, PGY-1 Pager: [phone] 4:52 PM, 09/02/2021

## 2021-09-02 NOTE — ED Notes (Signed)
Admitting paged to RN per her request 

## 2021-09-02 NOTE — ED Provider Triage Note (Signed)
Emergency Medicine Provider Triage Evaluation Note  Barron Vanloan , a 36 y.o. male  was evaluated in triage.  Pt complains of fluid overload.  Sent from internal medicine clinic.  Patient was recently hospitalized at the end of January, during that time he had a new dialysis fistula created, but has continued to receive dialysis since being discharged through his port.  He reports during both of these treatments his dialysis had to be shortened because of cramping and then a machine malfunction and he has continued to feel increasingly short of breath and generally weak.  He reports significant fatigue.  Sent from clinic to the ED for further evaluation of worsening fluid overload and likely need for dialysis.  Review of Systems  Positive: Shortness of breath, fatigue, generalized weakness, edema Negative: Chest pain, abdominal pain  Physical Exam  BP (!) 115/101    Pulse (!) 108    Temp 99.2 F (37.3 C) (Oral)    Resp (!) 24    SpO2 100%  Gen:   Awake, no distress, chronically ill-appearing Resp:  Tachypneic with some increased respiratory effort, on 2 L nasal cannula, decreased breath sounds bilaterally MSK:   Moves extremities without difficulty, immature fistula in left upper arm Other:    Medical Decision Making  Medically screening exam initiated at 10:56 AM.  Appropriate orders placed.  Burhan Barham was informed that the remainder of the evaluation will be completed by another provider, this initial triage assessment does not replace that evaluation, and the importance of remaining in the ED until their evaluation is complete.  Patient had labs drawn in the IM clinic today that are currently running, patient also had EKG, will get chest x-ray, patient will likely need dialysis today.   Jacqlyn Larsen, [place] 09/02/21 1117

## 2021-09-02 NOTE — ED Provider Notes (Signed)
[facility] EMERGENCY DEPARTMENT Provider Note   CSN: [id number] Arrival date & time: 09/02/21  1050     History  Chief Complaint  Patient presents with   Shortness of Breath    Phillip Young is a 36 y.o. male.  Patient with history of ESRD on dialysis (initiated July 2022), does make urine --presents to the emergency department today for worsening shortness of breath from internal medicine clinic.  He was seen today for hospital follow-up.  States that he dialyzes Tuesday, Thursday, and Saturday at [facility].  He has not had missed any sessions recently but has had a couple cut short.  He has a chronic cough that is worse when he becomes fluid overloaded.  He reports worsening swelling in his legs and lower abdomen.  No fevers or chills.  No vomiting.  He is short of breath at rest and gets much worse with any activity.  IMTS clinic today tried to arrange extra dialysis session for him, but they were unable to accommodate so he was sent to the emergency department.      Home Medications Prior to Admission medications   Medication Sig Start Date End Date Taking? Authorizing Provider  apixaban (ELIQUIS) 5 MG TABS tablet Take 1 tablet (5 mg total) by mouth 2 (two) times daily. 03/26/21   Carollo, Farley Ly, NP  cephALEXin (KEFLEX) 500 MG capsule Take 1 capsule (500 mg total) by mouth daily. Take 1 at bedtime every day. 08/04/21 11/02/21  Vu, Rockey Situ, MD  hydrOXYzine (ATARAX) 25 MG tablet Take 25 mg by mouth 2 (two) times daily. 08/17/21   [provider]  lanthanum (FOSRENOL) 500 MG chewable tablet Chew 500 mg by mouth 3 (three) times daily with meals.    [provider]      Allergies    Coreg [carvedilol] and Heparin    Review of Systems   Review of Systems  Physical Exam Updated Vital Signs BP (!) 123/92 (BP Location: Right Arm)    Pulse (!) 104    Temp 98.2 F (36.8 C)    Resp 17    SpO2 99%  Physical Exam Vitals and nursing note  reviewed.  Constitutional:      General: He is not in acute distress.    Appearance: He is well-developed.  HENT:     Head: Normocephalic and atraumatic.  Eyes:     General:        Right eye: No discharge.        Left eye: No discharge.     Conjunctiva/sclera: Conjunctivae normal.  Cardiovascular:     Rate and Rhythm: Normal rate and regular rhythm.     Heart sounds: Normal heart sounds.  Pulmonary:     Effort: Pulmonary effort is normal. Tachypnea present.     Breath sounds: Decreased breath sounds present.  Abdominal:     Palpations: Abdomen is soft.     Tenderness: There is no abdominal tenderness.  Musculoskeletal:     Cervical back: Normal range of motion and neck supple.     Right lower leg: Edema present.     Left lower leg: Edema present.  Skin:    General: Skin is warm and dry.  Neurological:     Mental Status: He is alert.    ED Results / Procedures / Treatments   Labs (all labs ordered are listed, but only abnormal results are displayed) Labs Reviewed  RESP PANEL BY RT-PCR (FLU A&B, COVID) [id number]  EKG None  Radiology DG Chest 2 View  Result Date: 09/02/2021 CLINICAL DATA:  Shortness of breath EXAM: CHEST - 2 VIEW COMPARISON:  Chest x-ray dated August 18, 2021 FINDINGS: Enlarged cardiac and mediastinal contours. Right chest wall dialysis catheter is unchanged position. Mild bilateral heterogeneous pulmonary opacities. No pleural effusion or pneumothorax. IMPRESSION: Cardiomegaly. Mild bilateral heterogeneous opacities, concerning for pulmonary edema. Electronically Signed   By: Yetta Glassman M.D.   On: 09/02/2021 11:48    Procedures Procedures    Medications Ordered in ED Medications - No data to display  ED Course/ Medical Decision Making/ A&P    Patient seen and examined. History obtained directly from patient and from IMTS clinic notes and labs done earlier today.  Work-up including labs, imaging, EKG ordered in triage, if performed, were  reviewed.    Labs/EKG: Independently reviewed and interpreted.  This included: CBC with white blood cell count 7.9, hemoglobin 9.2; CMP with creatinine 7.9, BUN 61, potassium 4.0; BNP at 839.9.    Imaging: Independently reviewed and interpreted.  This included: Chest x-ray, agree edema noted.  Medications/Fluids: Ordered: None. Considered administration of: Lasix as patient does make urine.   Most recent vital signs reviewed and are as follows: BP (!) 123/92 (BP Location: Right Arm)    Pulse (!) 104    Temp 98.2 F (36.8 C)    Resp 17    SpO2 99%   Initial impression: ESRD, fluid overload.   2:24 PM consulted with Dr. Jonnie Finner of nephrology and requested consult. They will see patient.  If they are in agreement, unless he were to require multiple sessions, I feel the patient may be able to go home if he feels better after dialysis.  3:37 PM Nephrology has seen. Requests admission as he will likely require multiple treatments. Will speak with IMTS.   3:45 PM IMTS aware, will see.                            Medical Decision Making  Fluid overload and pulmonary edema in setting of ESRD.         Final Clinical Impression(s) / ED Diagnoses Final diagnoses:  Hypervolemia, unspecified hypervolemia type  Acute pulmonary edema ([place])  ESRD (end stage renal disease) on dialysis [facility])    Rx / DC Orders ED Discharge Orders     None         Carlisle Cater, PA-C 62/86/38 [id number]    Campbell Stall P, DO 16/57/90 [phone]

## 2021-09-02 NOTE — Assessment & Plan Note (Signed)
Patient with advanced heart failure and end-stage renal disease on dialysis. He reports feeling "very tired of suffering". He expresses frustration with multiple hospitalizations and continuing to feel unwell. Throughout the interview, patient expressed his fatigue from his end-stage diseases and consideration of Hospice. I suspect this is reactive in setting of recent hospitalization and current episode of hypervolemia induced complications. Will continue to address at next visit.

## 2021-09-02 NOTE — Progress Notes (Signed)
PT was placed on BIPAP dreamstation 18/+6 (home settings) and 4 L 02 bleed in. PT is tolerating well at this time.

## 2021-09-02 NOTE — Progress Notes (Addendum)
° °  Subjective: No acute overnight events.   Patient was seen at bedside during rounds today. Pt reports feeling okay. Did not sleep well. He complains of itching.   Pt is updated on the plan for today, and all questions and concerns are addressed.   Objective:  Vital signs in last 24 hours: Vitals:   09/02/21 1430 09/02/21 1500 09/02/21 1515 09/02/21 1530  BP: 116/74 113/80 109/78 103/85  Pulse: 100 (!) 103 99 (!) 104  Resp: (!) 23 (!) 23 (!) 29   Temp:      TempSrc:      SpO2: 99% 99% 98% 96%   Constitutional: alert, ill-appearing, and in moderate distress  Cardiovascular: regular rhythm, tachycardic, no m/r/g, 1+ bilateral LE pitting edema  Pulmonary/Chest: increased work of breathing on 4L [place], bilateral crackles in lower lung fields.  Abdominal: 2+ abdominal edema present; non-tender to palpation, distended Neurological: A&O x 3, follows commands  Skin: cool and dry   Assessment/Plan:  Principal Problem:   Hypoxemia  Hypervolemia in the setting of incomplete HD sessions  ESRD on HD TTS Improved hypervolemia with HD yesterday. Working of breathing better compared to admission. No concerning electrolyte abnormalities. Poor compliance to fluid restriction, diet, and HD. Plan to continue with serial HD today.  - Nephrology following, appreciate assistance  - Serial HD per nephrology  - Midodrine with HD d/t hypotension  - Renal function panel  - CBC    Chronic HFrEF (EF 25-30%) Nonischemic cardiomyopathy Hx of HFrEF status post cardiogenic shock in September 2022. Echocardiogram in October 2022 show EF of 20%, nonischemic in nature. Patient is not a candidate for ICD either given BMI. - Volume managed controlled per nephrology with HD - Fluid restriction  - Strict I/O's and daily weights    Paroxysmal Afib Tachycardic 90-100's.  - Continue Eliquis    GOC discussion Multiple hospitalizations for several co-morbidities, many of which are end stage. Follows with AHF  team, and has had discussions surround hospice care.  - Palliative care consulted, appreciate assistance    MSSA septic arthritis of left sacroiliac joint and sacral osteomyelitis (03/2021) Hx of MRSA infection (2018) Initially on IV antibiotics and transitioned to Keflex at d/c in 04/2021. Follows with Dr Vu/ ID and recent MRI showing improvement in septic arthritis and resolution of abscesses in joint. Had ID follow up on 08/26/21 and pt instructed to continue Keflex pending labs and office follow up in 2 weeks. MRSA swab today negative.  - Continue Keflex 500 mg qd    DM2 Last A1c 5.9 (1/25). Not on any diabetes medications.  - SSI - CBG monitoring    Itching  - Continue hydroxyzine  - Itch lotion added    Anemia of chronic diease  Chronic and stable, Hgb 9.2 - ESA per nephrology     OSA on CPAP - Continue CPAP   Best Practice: Diet: Heart Healthy  IVF: none  VTE: apixaban  Code: Full   Lajean Manes, MD  Internal Medicine Resident, PGY-1 Pager: [phone] After 5pm on weekdays and 1pm on weekends: On Call pager [phone]

## 2021-09-03 ENCOUNTER — Other Ambulatory Visit: Payer: Self-pay

## 2021-09-03 ENCOUNTER — Encounter (HOSPITAL_COMMUNITY): Payer: Self-pay | Admitting: Internal Medicine

## 2021-09-03 DIAGNOSIS — I428 Other cardiomyopathies: Secondary | ICD-10-CM | POA: Diagnosis present

## 2021-09-03 DIAGNOSIS — G4733 Obstructive sleep apnea (adult) (pediatric): Secondary | ICD-10-CM | POA: Diagnosis present

## 2021-09-03 DIAGNOSIS — Z992 Dependence on renal dialysis: Secondary | ICD-10-CM

## 2021-09-03 DIAGNOSIS — E8770 Fluid overload, unspecified: Secondary | ICD-10-CM | POA: Diagnosis not present

## 2021-09-03 DIAGNOSIS — Z87891 Personal history of nicotine dependence: Secondary | ICD-10-CM | POA: Diagnosis not present

## 2021-09-03 DIAGNOSIS — I509 Heart failure, unspecified: Secondary | ICD-10-CM

## 2021-09-03 DIAGNOSIS — Z91199 Patient's noncompliance with other medical treatment and regimen due to unspecified reason: Secondary | ICD-10-CM | POA: Diagnosis not present

## 2021-09-03 DIAGNOSIS — Z9115 Patient's noncompliance with renal dialysis: Secondary | ICD-10-CM | POA: Diagnosis not present

## 2021-09-03 DIAGNOSIS — N186 End stage renal disease: Secondary | ICD-10-CM | POA: Diagnosis present

## 2021-09-03 DIAGNOSIS — E1122 Type 2 diabetes mellitus with diabetic chronic kidney disease: Secondary | ICD-10-CM | POA: Diagnosis present

## 2021-09-03 DIAGNOSIS — I132 Hypertensive heart and chronic kidney disease with heart failure and with stage 5 chronic kidney disease, or end stage renal disease: Secondary | ICD-10-CM | POA: Diagnosis present

## 2021-09-03 DIAGNOSIS — I5023 Acute on chronic systolic (congestive) heart failure: Secondary | ICD-10-CM | POA: Diagnosis present

## 2021-09-03 DIAGNOSIS — D631 Anemia in chronic kidney disease: Secondary | ICD-10-CM | POA: Diagnosis present

## 2021-09-03 DIAGNOSIS — R0902 Hypoxemia: Secondary | ICD-10-CM | POA: Diagnosis present

## 2021-09-03 DIAGNOSIS — Z8249 Family history of ischemic heart disease and other diseases of the circulatory system: Secondary | ICD-10-CM | POA: Diagnosis not present

## 2021-09-03 DIAGNOSIS — Z6841 Body Mass Index (BMI) 40.0 and over, adult: Secondary | ICD-10-CM | POA: Diagnosis not present

## 2021-09-03 DIAGNOSIS — Z515 Encounter for palliative care: Secondary | ICD-10-CM | POA: Diagnosis not present

## 2021-09-03 DIAGNOSIS — Z7901 Long term (current) use of anticoagulants: Secondary | ICD-10-CM | POA: Diagnosis not present

## 2021-09-03 DIAGNOSIS — Z20822 Contact with and (suspected) exposure to covid-19: Secondary | ICD-10-CM | POA: Diagnosis present

## 2021-09-03 DIAGNOSIS — I5084 End stage heart failure: Secondary | ICD-10-CM | POA: Diagnosis present

## 2021-09-03 DIAGNOSIS — Z7189 Other specified counseling: Secondary | ICD-10-CM

## 2021-09-03 DIAGNOSIS — I480 Paroxysmal atrial fibrillation: Secondary | ICD-10-CM | POA: Diagnosis present

## 2021-09-03 DIAGNOSIS — K219 Gastro-esophageal reflux disease without esophagitis: Secondary | ICD-10-CM | POA: Diagnosis present

## 2021-09-03 DIAGNOSIS — N2581 Secondary hyperparathyroidism of renal origin: Secondary | ICD-10-CM | POA: Diagnosis present

## 2021-09-03 LAB — CBC
HCT: 30.1 % — ABNORMAL LOW (ref 39.0–52.0)
Hemoglobin: 9.7 g/dL — ABNORMAL LOW (ref 13.0–17.0)
MCH: 31.2 pg (ref 26.0–34.0)
MCHC: 32.2 g/dL (ref 30.0–36.0)
MCV: 96.8 fL (ref 80.0–100.0)
Platelets: 237 10*3/uL (ref 150–400)
RBC: 3.11 MIL/uL — ABNORMAL LOW (ref 4.22–5.81)
RDW: 18.4 % — ABNORMAL HIGH (ref 11.5–15.5)
WBC: 9.3 10*3/uL (ref 4.0–10.5)
nRBC: 0 % (ref 0.0–0.2)

## 2021-09-03 LAB — RENAL FUNCTION PANEL
Albumin: 3.4 g/dL — ABNORMAL LOW (ref 3.5–5.0)
Anion gap: 16 — ABNORMAL HIGH (ref 5–15)
BUN: 42 mg/dL — ABNORMAL HIGH (ref 6–20)
CO2: 22 mmol/L (ref 22–32)
Calcium: 9.1 mg/dL (ref 8.9–10.3)
Chloride: 98 mmol/L (ref 98–111)
Creatinine, Ser: 6.45 mg/dL — ABNORMAL HIGH (ref 0.61–1.24)
GFR, Estimated: 11 mL/min — ABNORMAL LOW (ref >60)
Glucose, Bld: 115 mg/dL — ABNORMAL HIGH (ref 70–99)
Phosphorus: 3.8 mg/dL (ref 2.5–4.6)
Potassium: 4.3 mmol/L (ref 3.5–5.1)
Sodium: 136 mmol/L (ref 135–145)

## 2021-09-03 LAB — MAGNESIUM: Magnesium: 1.8 mg/dL (ref 1.7–2.4)

## 2021-09-03 LAB — MRSA NEXT GEN BY PCR, NASAL: MRSA by PCR Next Gen: NOT DETECTED

## 2021-09-03 MED ORDER — ORAL CARE MOUTH RINSE
15.0000 mL | Freq: Two times a day (BID) | OROMUCOSAL | Status: DC
  Administered 2021-09-03: 15 mL via OROMUCOSAL

## 2021-09-03 MED ORDER — ALBUMIN HUMAN 25 % IV SOLN
INTRAVENOUS | Status: AC
  Administered 2021-09-03: 12.5 g
  Filled 2021-09-03: qty 100

## 2021-09-03 MED ORDER — CHLORHEXIDINE GLUCONATE CLOTH 2 % EX PADS
6.0000 | MEDICATED_PAD | Freq: Every day | CUTANEOUS | Status: DC
  Administered 2021-09-03 – 2021-09-04 (×2): 6 via TOPICAL

## 2021-09-03 MED ORDER — CHLORHEXIDINE GLUCONATE CLOTH 2 % EX PADS: 6.0000 | MEDICATED_PAD | Freq: Every day | CUTANEOUS | Status: DC

## 2021-09-03 MED ORDER — CAMPHOR-MENTHOL 0.5-0.5 % EX LOTN
TOPICAL_LOTION | CUTANEOUS | Status: DC | PRN
  Filled 2021-09-03: qty 222

## 2021-09-03 MED ORDER — LIDOCAINE 5 % EX PTCH
1.0000 | MEDICATED_PATCH | Freq: Once | CUTANEOUS | Status: AC | PRN
  Administered 2021-09-03: 1 via TRANSDERMAL
  Filled 2021-09-03: qty 1

## 2021-09-03 MED ORDER — LORAZEPAM 2 MG/ML IJ SOLN: 2.0000 mg | Freq: Four times a day (QID) | INTRAMUSCULAR | Status: DC | PRN

## 2021-09-03 MED ORDER — ACETAMINOPHEN 500 MG PO TABS
1000.0000 mg | ORAL_TABLET | Freq: Four times a day (QID) | ORAL | Status: DC | PRN
  Administered 2021-09-03: 1000 mg via ORAL
  Filled 2021-09-03: qty 2

## 2021-09-03 MED ORDER — CHLORHEXIDINE GLUCONATE 0.12 % MT SOLN
15.0000 mL | Freq: Two times a day (BID) | OROMUCOSAL | Status: DC
  Administered 2021-09-03 (×3): 15 mL via OROMUCOSAL
  Filled 2021-09-03 (×3): qty 15

## 2021-09-03 NOTE — Progress Notes (Signed)
New Admission Note:  Arrival Method: Stretcher Mental Orientation: Alert and oriented x 4 Telemetry: N/A Assessment: Completed Skin: Warm and dry IV: NSL Pain: 8/10 Left hip pain Tubes: N/a Safety Measures: Safety Fall Prevention Plan initiated.  Admission: Completed 5 M  Orientation: Patient has been orientated to the room, unit and the staff. Welcome booklet given.  Family: N/A  Orders have been reviewed and implemented. Will continue to monitor the patient. Call light has been placed within reach and bed alarm has been activated.   Sima Matas BSN, RN  Phone Number: [phone]

## 2021-09-03 NOTE — Plan of Care (Signed)
  Problem: Education: Goal: Knowledge of General Education information will improve Description Including pain rating scale, medication(s)/side effects and non-pharmacologic comfort measures Outcome: Progressing   

## 2021-09-03 NOTE — Progress Notes (Incomplete)
° °  Subjective: No acute overnight events.   Patient was seen at bedside during rounds today. Pt reports feeling okay. Did not sleep well. He reports drinking a juice and a cup of fluids this morning. He complains of itching.   Pt is updated on the plan for today, and all questions and concerns are addressed.   Objective:  Vital signs in last 24 hours: Vitals:   09/03/21 0033 09/03/21 0433 09/03/21 0800 09/03/21 0822  BP: (!) 157/95 103/76 (!) 80/48 (!) 125/52  Pulse: (!) 103 100 (!) 105   Resp: 20 19 16    Temp: 97.6 F (36.4 C) (!) 97.5 F (36.4 C) 98.6 F (37 C)   TempSrc: Oral Oral Oral   SpO2: 100% 100% 100%    Constitutional: alert, ill-appearing, and in moderate distress  Cardiovascular: regular rhythm, tachycardic, no m/r/g, 1+ bilateral LE pitting edema  Pulmonary/Chest: increased work of breathing on 4L [place], bilateral crackles in lower lung fields.  Abdominal: 2+ abdominal edema present; non-tender to palpation, distended Neurological: A&O x 3, follows commands  Skin: cool and dry   Assessment/Plan:  Principal Problem:   Hypoxemia  Hypervolemia in the setting of incomplete HD sessions  ESRD on HD TTS Improved hypervolemia and work of breathing with serial HD sessions. Poor compliance to fluid restriction and diet, as well as HD compliance; high likelihood for future readmission.  - Nephrology following, appreciate assistance  - Midodrine with HD d/t hypotension  - Renal function panel  - CBC    Chronic HFrEF (EF 25-30%) Nonischemic cardiomyopathy Hx of HFrEF status post cardiogenic shock in September 2022. Echocardiogram in October 2022 show EF of 20%, nonischemic in nature. Patient is not a candidate for ICD either given BMI. Pt continues to do poorly with fluid restriction.  - Volume management per nephrology with HD - Fluid restriction  - Strict I/O's and daily weights    Paroxysmal Afib Tachycardic 90-100's.  - Continue Eliquis    GOC  discussion Palliative care consulted. ***   MSSA septic arthritis of left sacroiliac joint and sacral osteomyelitis (03/2021) Hx of MRSA infection (2018) Initially on IV antibiotics and transitioned to Keflex at d/c in 04/2021. Follows with Dr Vu/ ID and recent MRI showing improvement in septic arthritis and resolution of abscesses in joint. Had ID follow up on 08/26/21 and pt instructed to continue Keflex pending labs and office follow up in 2 weeks. MRSA swab today negative.  - Continue Keflex 500 mg qd    DM2 Last A1c 5.9 (1/25)- not on any mediations  - SSI - CBG monitoring    Itching  - Continue hydroxyzine  - Itch lotion added    Anemia of chronic diease  Chronic and stable, Hgb 9.2 - ESA per nephrology     OSA on CPAP - Continue CPAP   Best Practice: Diet: Heart Healthy  IVF: none  VTE: apixaban  Code: Full   Lajean Manes, MD  Internal Medicine Resident, PGY-1 Pager: [phone] After 5pm on weekdays and 1pm on weekends: On Call pager [phone]

## 2021-09-03 NOTE — Progress Notes (Signed)
Pt receives out-pt HD at [facility] on TTS. Case discussed with Palliative Provider. Will assist as needed.  Melven Sartorius Renal Navigator [phone]

## 2021-09-03 NOTE — Progress Notes (Signed)
Internal Medicine Clinic Attending  I saw and evaluated the patient.  I personally confirmed the key portions of the history and exam documented by Dr. Aslam and I reviewed pertinent patient test results.  The assessment, diagnosis, and plan were formulated together and I agree with the documentation in the resident's note.     

## 2021-09-03 NOTE — Hospital Course (Addendum)
Hypervolemia in the setting of incomplete HD sessions  ESRD on HD TTS Pt presenting with [facility], DOE, and orthopnea in the setting of incomplete HD sessions and non-adherence to fluid restriction and dietary indiscretion. Last received HD yesterday, incomplete session. BMP elevated and CXR with pulmonary edema. Exam with 1+ LE edema but has abdominal edema with increased work of breathing. No concerning electrolyte abnormalities. Nephrology consulted in the ED, want to bring him in for serial HD sessions. Completed serial HD sessions with improvement in symptoms. Monitored labs for any electrolyte changes. Pt discharged, and instructed to resume TTS HD sessions.    Chronic HFrEF (EF 25-30%) Nonischemic cardiomyopathy Hx of HFrEF status post cardiogenic shock in September 2022. Echocardiogram in October 2022 show EF of 20%, nonischemic in nature. Could not get cardiac MRI due to body habitus. Patient is not a candidate for ICD either given BMI. Volume managed controlled per nephrology with HD. Fluid restriction, and Strict I/O's and daily weights monitored.    Paroxysmal Afib Tachycardic 90-100's.  - Continued Eliquis    GOC discussion Multiple hospitalizations for several co-morbidities, many of which are end stage. Follows with AHF team, and has had discussions surround hospice care. Pt reports that may be interested in pursuing comfort care measures and "being comfortable". He has had conversations surrounding this with his mother. He requests palliative care consultation. He desires to be full code for now until speaking with palliative care team.   Palliative care consulted: Pt reports suffering surround HD. Palliative ordered Lorazepam prn for prior to HD sessions. Pt wishes to continue with current tx. He plans on continuing to have family meetings and information surrounding this.    MSSA septic arthritis of left sacroiliac joint and sacral osteomyelitis (03/2021) Hx of MRSA infection  (2018) Initially on IV antibiotics and transitioned to Keflex at d/c in 04/2021. Follows with Dr Vu/ ID and recent MRI showing improvement in septic arthritis and resolution of abscesses in joint. Had ID follow up on 08/26/21 and pt instructed to continue Keflex pending labs and office follow up in 2 weeks. Continued Keflex 500 mg qd.  MRSA swab today this admission.    DM2 Last A1c 5.9 (1/25). Not on any diabetes medications.  - SSI - CBG monitoring    Itching  - Continue hydroxyzine  - Itch cream    Anemia of chronic diease  Chronic and stable, Hgb 9.2 - ESA per nephrology     OSA on CPAP - Continue CPAP    Class 3 obesity  - Encourage weight loss

## 2021-09-03 NOTE — Progress Notes (Signed)
[facility] Progress Note   Subjective:   Patient seen and examined at bedside in room.  Breathing better but still feels overloaded.  Net UF only 3.3L yesterday due to cramping in his hands.  Reports continues to have daily urine output.  Denies CP, abdominal pain and n/v/d.   Objective Vitals:   09/03/21 0033 09/03/21 0433 09/03/21 0800 09/03/21 0822  BP: (!) 157/95 103/76 (!) 80/48 (!) 125/52  Pulse: (!) 103 100 (!) 105   Resp: 20 19 16    Temp: 97.6 F (36.4 C) (!) 97.5 F (36.4 C) 98.6 F (37 C)   TempSrc: Oral Oral Oral   SpO2: 100% 100% 100%    Physical Exam General:chronically ill appearing morbidly obese male in NAD Heart:RRR, no mrg Lungs:breath sounds decreased, +scattered rhonchi, increased WOB with talking Abdomen:soft, 1+edema on pannus  Extremities:trace to 1+ LE edema Dialysis Access: TDC, LU AVF maturing  Filed Weights    Intake/Output Summary (Last 24 hours) at 09/03/2021 1511 Last data filed at 09/03/2021 1300 Gross per 24 hour  Intake 805 ml  Output 3300 ml  Net -2495 ml    Additional Objective Labs: Basic Metabolic Panel: Recent Labs  Lab 09/02/21 1030 09/03/21 0125  NA 136 136  K 4.0 4.3  CL 99 98  CO2 22 22  GLUCOSE 142* 115*  BUN 61* 42*  CREATININE 7.91* 6.45*  CALCIUM 9.0 9.1  PHOS  --  3.8   Liver Function Tests: Recent Labs  Lab 09/02/21 1030 09/03/21 0125  AST 24  --   ALT 38  --   ALKPHOS 118  --   BILITOT 1.9*  --   PROT 6.8  --   ALBUMIN 3.2* 3.4*    CBC: Recent Labs  Lab 09/02/21 1030 09/03/21 0125  WBC 7.9 9.3  HGB 9.2* 9.7*  HCT 28.6* 30.1*  MCV 99.0 96.8  PLT 248 237    DG Chest 2 View  Result Date: 09/02/2021 CLINICAL DATA:  Shortness of breath EXAM: CHEST - 2 VIEW COMPARISON:  Chest x-ray dated August 18, 2021 FINDINGS: Enlarged cardiac and mediastinal contours. Right chest wall dialysis catheter is unchanged position. Mild bilateral heterogeneous pulmonary opacities. No pleural effusion  or pneumothorax. IMPRESSION: Cardiomegaly. Mild bilateral heterogeneous opacities, concerning for pulmonary edema. Electronically Signed   By: Yetta Glassman M.D.   On: 09/02/2021 11:48    Medications:  anticoagulant sodium citrate      apixaban  5 mg Oral BID   cephALEXin  500 mg Oral Daily   chlorhexidine  15 mL Mouth Rinse BID   Chlorhexidine Gluconate Cloth  6 each Topical Q0600   doxercalciferol  3 mcg Intravenous Q T,Th,Sa-HD   hydrOXYzine  25 mg Oral BID   lanthanum  1,000 mg Oral TID WC   mouth rinse  15 mL Mouth Rinse q12n4p   midodrine  10 mg Oral Q T,Th,Sa-HD    Dialysis Orders: TTS- [facility]  4hrs, BFR 450, DFR AF 1.5,  EDW 175kg, 4K/ 2.25Ca   Access: TDC, LU AVF maturing  Heparin NONE HIT+, use sodium citrate to lock catheter Mircera 60 mcg q2wks - last 24mcg on 08/06/21 Hectorol 3 mcg IV qHD     Assessment/Plan:  Volume overload/Pulmonary edema - 2/2 non compliance with HD/fluid restrictions.  Reminded of fluid restrictions.  Serial HD.  Will reassess after HD tomorrow.   ESRD -  on HD TTS.  HD today per regular schedule.   Hypotension - Blood pressure in goal.  On midodrine with HD.  Anemia of CKD - Hgb 9.2, will order ESA with HD tomorrow.   Secondary Hyperparathyroidism -  Ca in goal. Check phos.  Continue VDRA and binders.   Nutrition - Renal diet w/strict fluid restrictions HFrEF EF 25-30%,   Afib - on eliquis OSA - uses bipap at night      Jen Mow, PA-C [place] 09/03/2021,3:11 PM  LOS: 0 days

## 2021-09-03 NOTE — Progress Notes (Signed)
°  Date: 09/03/2021  Patient name: Phillip Young  Medical record number: [id number]  Date of birth: December 25, 1985   I have seen and evaluated Phillip Young and discussed their care with the Residency Team.  In brief, patient is a 36 year old male with a past medical history of hypertension, type 2 diabetes, paroxysmal A-fib on Eliquis, chronic systolic heart failure with an EF of 20%, OSA on CPAP and ESRD on hemodialysis who presented to the ED from the internal medicine clinic for volume overload.  Patient states that his last 2 dialysis sessions were cut short secondary to machine malfunction.  At his last hemodialysis session (on the day prior to admission) he had 4 L pulled off.  Patient states that he has been having worsening shortness of breath and orthopnea over the last 4 to 5 days and has noted progressive lower extremity swelling as well.  Patient states that he has been drinking more fluids daily and is not compliant with the dietary restrictions as well.  No chest pain, no palpitations, no lightheadedness, no syncope, no focal weakness, no tingling or numbness, no fevers or chills, no abdominal pain, no nausea or vomiting, no diarrhea.  Today, patient states that he had cut his dialysis short yesterday because of cramping in his hands.  Patient also complains of pruritus over his legs and would like a cream for this.  Patient also states that he would like to discuss possible hospice with palliative care.  PMHx, Fam Hx, and/or Soc Hx : As per resident admit note  Vitals:   09/03/21 0800 09/03/21 0822  BP: (!) 80/48 (!) 125/52  Pulse: (!) 105   Resp: 16   Temp: 98.6 F (37 C)   SpO2: 100%    General: Awake, alert, oriented x3, NAD CVS: Regular rate and rhythm, normal heart sounds Lungs: Diminished breath sounds in the bases otherwise clear to auscultation today Abdomen: Soft, nontender, nondistended, normoactive bowel sounds Extremities: 1+ bilateral lower extremity pitting edema  noted on exam, nontender, HEENT: Normocephalic, atraumatic Skin: Excoriations noted over bilateral lower extremities Psych: Normal mood and affect  Assessment and Plan: I have seen and evaluated the patient as outlined above. I agree with the formulated Assessment and Plan as detailed in the residents' note, with the following changes:   1.  Acute on chronic systolic heart failure exacerbation in the setting of incomplete HD sessions: -Patient presented to the ED with progressively worsening shortness of breath and lower extremity swelling over the last 4 to 5 days in the setting of having to cut his last hemodialysis session short and a history of chronic systolic heart failure with an EF of 20%.  Patient still with some evidence of volume overload on exam, pulmonary edema on chest x-ray and was noted to have an elevated BNP in the 800s consistent with a heart failure exacerbation. -Nephrology follow-up and recommendations appreciated.  We will continue with hemodialysis per nephrology -Continue with fluid restriction, strict I's and O's and daily weights -We will continue the midodrine secondary to hypotension -Patient status post HD yesterday and will go for another HD session today per discussion with nephrology. -Extensive discussion held with patient today.  He states that he would like to discuss goals of care with palliative care and is considering hospice as he would like to have control over how he dies.  We will obtain palliative care consult -No further work-up at this time -We will continue to monitor closely  Phillip Contes, MD 2/9/20232:17 PM

## 2021-09-03 NOTE — Progress Notes (Signed)
Upon entering room, patient resting on CPAP. Patient has Nasal cannula in place with mask. Explained to patient I was going to remove cannula and bleed oxygen into mask. Patient refused, stated "he had oxygen". RT will monitor as needed.

## 2021-09-03 NOTE — Consult Note (Signed)
Consultation Note Date: 09/03/2021   Patient Name: Phillip Young  DOB: 1986-03-10  MRN: [id number]  Age / Sex: 36 y.o., male  PCP: Pcp, No Referring Physician: Aldine Contes, MD  Reason for Consultation: GOC  HPI/Patient Profile: 36 y.o. male  with past medical history of ESRD on HD, CHF (last ECHO showed 20% EF), OSA on CPAP, morbid obesity, DM2, HTN, MSSA bacteremia, admitted on  09/02/2021 with shortness of breath due to volume overload. Palliative medicine consulted per patient's request.    Primary Decision Maker PATIENT  Discussion: Phillip Young is familiar to the Palliative medicine team as we have worked with him extensively during past admissions.  Today Phillip Young is sitting on his bedside. He is eating and drinking.  We had multiple discussions today both before and after me calling his mom.  He shares he has had difficulties in the last several months- frequent hospitalizations for volume overload due to various reasons of missing his dialysis treatments.  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  Phillip Young shares that he has experienced a great deal of suffering. He suffers sometimes when he's at dialysis and his hands cramp severely. He suffers when he misses dialysis and then gets fluid overloaded and he can't breathe.  He knows that the only way to prevent getting fluid overloaded is by "being bound" to his dialysis and the rules that illness dictates that he follows (ie fluid restrictions, diet restrictions) but for various reasons (his own, and other reasons beyond his control)- he keeps coming back to a place where he gets volume overloaded and feels awful. He notes that sometimes even when he is on his HD schedule he still feels tired, nauseated, and short of breath.  We explored his thoughts and feelings regarding continued aggressive life prolonging medical care vs stopping those things and just  being comfortable with medications. We also discussed ways to limit his suffering as well as keep him on HD and prolong his life.  His reasons for making that transition in his care are multifactorial- 1. He experiences suffering both during his HD sessions and when can't go to his HD sessions 2. He is afraid of dying suddenly and alone in an uncontrolled way and he believes that this is a way that he can ensure he doesn't suffer again in the future 3. He currently has insurance in place and all his financial needs could be met until his death- he worries about losing his insurance if he becomes disabled and can no longer work He is not depressed or suicidal.  He has mentioned going with Hospice and comfort care to his mom and she is not supportive of that idea.  He inquired about how much time he would likely live if he made a transition in his plan of care- we discussed that typically patients with heart failure and ESRD on HD live about 10-12 days once they stop their HD. He was surprised at that- however, also noted he was glad it wouldn't be  a long drawn out process with more suffering.  We discussed his code status- he continues to endorse full code.  Encouraged him to consider DNR/DNI status understanding evidenced based poor outcomes in similar hospitalized patients, as the cause of the arrest is likely associated with chronic/terminal disease rather than a reversible acute cardio-pulmonary event.  We also discussed that if he were to stop dialysis and transition to comfort- then doing CPR would not be in line with that goal of care.I also called and spoke with [place] mom per her request. She shares feelings that Phillip Young does well when he gets the dialysis treatments he needs. Her impression was that the dialysis center cut his HD sessions short last week due to machines not working and that is what has lead to his most recent hospitalization and his current feelings about his trajectory- her hopes would  be for him to change dialysis centers.     SUMMARY OF RECOMMENDATIONS -Continue current plan for HD -Phillip Young and his Mom are in agreement to plan a time to meet together with PMT for further discussion- possibly tomorrow or sometime in the next few days -Phillip Young order lorazepam $RemoveBeforeD'[id number]$  IV q6hr prn and request that it be given prior to [facility] dialysis treatment to help limit his suffering during the treatments -Phillip Young is uncertain about which path of care he wishes to take for now- Phillip Young continue to support him- he does accept that he has responsibility in his own health which is a major shift from my previous interactions with him- he just has difficulties in following what his state of health and illnesses require of him and this also contributes to his suffering     Code Status/Advance Care Planning: Full code   Prognosis:   Unable to determine  Discharge Planning: To Be Determined  Primary Diagnoses: Present on Admission:  Hypoxemia   Review of Systems  Physical Exam Vitals and nursing note reviewed.  Eyes:     General: Scleral icterus present.  Cardiovascular:     Rate and Rhythm: Tachycardia present.  Pulmonary:     Comments: Increased effort and rate Neurological:     Mental Status: He is alert and oriented to person, place, and time.  Psychiatric:        Thought Content: Thought content normal.        Judgment: Judgment normal.    Vital Signs: BP (!) 125/52 (BP Location: Right Arm)    Pulse (!) 105    Temp 98.6 F (37 C) (Oral)    Resp 16    SpO2 100%  Pain Scale: 0-10   Pain Score: 8    SpO2: SpO2: 100 % O2 Device:SpO2: 100 % O2 Flow Rate: .O2 Flow Rate (L/min): 3 L/min  IO: Intake/output summary:  Intake/Output Summary (Last 24 hours) at 09/03/2021 1444 Last data filed at 09/03/2021 1300 Gross per 24 hour  Intake 805 ml  Output 3300 ml  Net -2495 ml    LBM: Last BM Date: 09/01/21 Baseline Weight: Weight:  (unable to check stretcher wt) Most recent weight:  Weight:  (Unable to check stretcher wt)     Palliative Assessment/Data: PPS: 70%       Thank you for this consult. Palliative medicine Phillip Young continue to follow and assist as needed.   Time Total: 180 minutes Greater than 50%  of this time was spent counseling and coordinating care related to the above assessment and plan.  Signed by: Mariana Kaufman, AGNP-C Palliative Medicine    Please  contact Palliative Medicine Team phone at [phone] for questions and concerns.  For individual provider: See Shea Evans

## 2021-09-04 DIAGNOSIS — R0902 Hypoxemia: Secondary | ICD-10-CM | POA: Diagnosis not present

## 2021-09-04 DIAGNOSIS — Z515 Encounter for palliative care: Secondary | ICD-10-CM

## 2021-09-04 LAB — CBC
HCT: 29.3 % — ABNORMAL LOW (ref 39.0–52.0)
Hemoglobin: 9.4 g/dL — ABNORMAL LOW (ref 13.0–17.0)
MCH: 31.6 pg (ref 26.0–34.0)
MCHC: 32.1 g/dL (ref 30.0–36.0)
MCV: 98.7 fL (ref 80.0–100.0)
Platelets: 234 10*3/uL (ref 150–400)
RBC: 2.97 MIL/uL — ABNORMAL LOW (ref 4.22–5.81)
RDW: 18.1 % — ABNORMAL HIGH (ref 11.5–15.5)
WBC: 9.1 10*3/uL (ref 4.0–10.5)
nRBC: 0 % (ref 0.0–0.2)

## 2021-09-04 LAB — BASIC METABOLIC PANEL
Anion gap: 14 (ref 5–15)
BUN: 38 mg/dL — ABNORMAL HIGH (ref 6–20)
CO2: 23 mmol/L (ref 22–32)
Calcium: 9.1 mg/dL (ref 8.9–10.3)
Chloride: 99 mmol/L (ref 98–111)
Creatinine, Ser: 6.32 mg/dL — ABNORMAL HIGH (ref 0.61–1.24)
GFR, Estimated: 11 mL/min — ABNORMAL LOW (ref >60)
Glucose, Bld: 99 mg/dL (ref 70–99)
Potassium: 4.1 mmol/L (ref 3.5–5.1)
Sodium: 136 mmol/L (ref 135–145)

## 2021-09-04 MED ORDER — BENZONATATE 200 MG PO CAPS: 200.0000 mg | ORAL_CAPSULE | Freq: Three times a day (TID) | ORAL | 0 refills | Status: DC | PRN

## 2021-09-04 MED ORDER — CHLORHEXIDINE GLUCONATE CLOTH 2 % EX PADS: 6.0000 | MEDICATED_PAD | Freq: Every day | CUTANEOUS | Status: DC

## 2021-09-04 NOTE — Progress Notes (Signed)
Pt to d/c to home today and resume care at clinic tomorrow. Contacted [facility] and spoke to [place], Therapist, sports to make staff aware of pt's d/c and resumption of care.   Melven Sartorius Renal Navigator [phone]

## 2021-09-04 NOTE — Discharge Instructions (Signed)
You were admitted due to volume overload from incomplete dialysis sessions and from drinking too much fluids. You has serial HD sessions here in the hospital with good improvement in your symptoms. We also had the palliative team come by and talk to you; please continue these discussions as it is important. Please follow up with your PCP in 1 week for a post hospital visit.

## 2021-09-04 NOTE — Discharge Summary (Signed)
Name: Phillip Young MRN: [id number] DOB: [birth year] 36 y.o. PCP: Pcp, No  Date of Admission: 09/02/2021 10:52 AM Date of Discharge:  09/04/21 Attending Physician: Dr. Dareen Piano  DISCHARGE DIAGNOSIS:  Primary Problem: Hypoxemia 2/2 volume overload in setting of noncompliance to fluid restriction and incomplete HD sessions.   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   benzonatate 200 MG capsule Commonly known as: TESSALON Take 1 capsule (200 mg total) by mouth 3 (three) times daily as needed for cough.   cephALEXin 500 MG capsule Commonly known as: KEFLEX Take 1 capsule (500 mg total) by mouth daily. Take 1 at bedtime every day.   hydrOXYzine 25 MG tablet Commonly known as: ATARAX Take 25 mg by mouth 2 (two) times daily.   lanthanum 500 MG chewable tablet Commonly known as: FOSRENOL Chew 500 mg by mouth 3 (three) times daily with meals.        DISPOSITION AND FOLLOW-UP:  [name] was discharged from [facility] in stable condition. At the hospital follow up visit please address:  Follow-up Recommendations: Consults: Palliative medicine  Labs: BMP  Studies: none  Medications: none   Follow-up Appointments:  Follow-up Information     [place]. Call today.   Why: Please schedule a follow up visit in 1 week. Someone from the clinic should call you; you can call if you do not hear back. Contact information: [address] [place] COURSE:   Patient Summary: Hypervolemia in the setting of incomplete HD sessions  ESRD on HD TTS Pt presenting with [facility], DOE, and orthopnea in the setting of incomplete HD sessions and non-adherence to fluid restriction and dietary indiscretion. Last received HD yesterday, incomplete session. BMP elevated and CXR with pulmonary edema. Exam with 1+ LE edema but has abdominal edema with increased work of breathing. No concerning electrolyte abnormalities. Nephrology consulted in the ED, want to bring him in for serial HD sessions. Completed serial HD sessions with improvement in symptoms. Monitored labs for any electrolyte changes. Pt discharged, and instructed to resume TTS HD sessions.    Chronic HFrEF (EF 25-30%) Nonischemic cardiomyopathy Hx of HFrEF status post cardiogenic shock in September 2022. Echocardiogram in October 2022 show EF of 20%, nonischemic in nature. Could not get cardiac MRI due to body habitus. Patient is not a candidate for ICD either given BMI. Volume managed controlled per nephrology with HD. Fluid restriction, and Strict I/O's and daily weights monitored.    Paroxysmal Afib Tachycardic 90-100's.  - Continued Eliquis    GOC discussion Multiple hospitalizations for several co-morbidities, many of which are end stage. Follows with AHF team, and has had discussions surround hospice care. Pt reports that may be interested in pursuing comfort care measures and "being comfortable". He has had conversations surrounding this with his mother. He requests palliative care consultation. He desires to be full code for  now until speaking with palliative care team.   Palliative care consulted: Pt reports suffering surround HD. Palliative ordered Lorazepam prn for prior to HD sessions. Pt wishes to continue with current tx. He plans on continuing to have family meetings and information surrounding this.    MSSA septic arthritis of left sacroiliac joint and sacral osteomyelitis (03/2021) Hx of MRSA  infection (2018) Initially on IV antibiotics and transitioned to Keflex at d/c in 04/2021. Follows with Dr Vu/ ID and recent MRI showing improvement in septic arthritis and resolution of abscesses in joint. Had ID follow up on 08/26/21 and pt instructed to continue Keflex pending labs and office follow up in 2 weeks. Continued Keflex 500 mg qd.  MRSA swab today this admission.    DM2 Last A1c 5.9 (1/25). Not on any diabetes medications.  - SSI - CBG monitoring    Itching  - Continue hydroxyzine  - Itch cream    Anemia of chronic diease  Chronic and stable, Hgb 9.2 - ESA per nephrology     OSA on CPAP - Continue CPAP    Class 3 obesity  - Encourage weight loss    DISCHARGE INSTRUCTIONS:   Discharge Instructions     (HEART FAILURE PATIENTS) Call MD:  Anytime you have any of the following symptoms: 1) 3 pound weight gain in 24 hours or 5 pounds in 1 week 2) shortness of breath, with or without a dry hacking cough 3) swelling in the hands, feet or stomach 4) if you have to sleep on extra pillows at night in order to breathe.   Complete by: As directed    Call MD for:  difficulty breathing, headache or visual disturbances   Complete by: As directed    Call MD for:  extreme fatigue   Complete by: As directed    Call MD for:  hives   Complete by: As directed    Call MD for:  persistant dizziness or light-headedness   Complete by: As directed    Call MD for:  persistant nausea and vomiting   Complete by: As directed    Call MD for:  redness, tenderness, or signs of infection (pain, swelling, redness, odor or green/yellow discharge around incision site)   Complete by: As directed    Call MD for:  severe uncontrolled pain   Complete by: As directed    Call MD for:  temperature >100.4   Complete by: As directed    Diet - low sodium heart healthy   Complete by: As directed    Increase activity slowly   Complete by: As directed        SUBJECTIVE:  No acute overnight events.  Patient was seen at bedside during rounds this morning. Pt reports feeling well this morning. He reports improvement in itching with the cream. He reports improvement in [facility] and swelling. He is ready to resumed TTS HD sessions starting tmrw. He is appreciative of the care he has received here.   No other complains or concerns at this time.   All questions were addressed with patient prior to being discharged.   Discharge Vitals:   BP 116/80 (BP Location: Right Arm)    Pulse (!) 103    Temp 98.8 F (37.1 C) (Oral)    Resp 17    Wt (!) 187.3 kg    SpO2 100%    BMI 56.00 kg/m   OBJECTIVE:  Constitutional: alert, ill-appearing, and in moderate distress  Cardiovascular: regular rhythm, tachycardic, no  m/r/g, 1+ bilateral LE pitting edema  Pulmonary/Chest: increased work of breathing on 4L [place], LCTAB Abdominal: trace abdominal edema present; non-tender to palpation, distended Neurological: A&O x 3, follows commands  Skin: cool and dry  Pertinent Labs, Studies, and Procedures:  CBC Latest Ref Rng & Units 09/04/2021 09/03/2021 09/02/2021  WBC 4.0 - 10.5 K/uL 9.1 9.3 7.9  Hemoglobin 13.0 - 17.0 g/dL 9.4(L) 9.7(L) 9.2(L)  Hematocrit 39.0 - 52.0 % 29.3(L) 30.1(L) 28.6(L)  Platelets 150 - 400 K/uL 234 237 248    CMP Latest Ref Rng & Units 09/04/2021 09/03/2021 09/02/2021  Glucose 70 - 99 mg/dL 99 115(H) 142(H)  BUN 6 - 20 mg/dL 38(H) 42(H) 61(H)  Creatinine 0.61 - 1.24 mg/dL 6.32(H) 6.45(H) 7.91(H)  Sodium 135 - 145 mmol/L 136 136 136  Potassium 3.5 - 5.1 mmol/L 4.1 4.3 4.0  Chloride 98 - 111 mmol/L 99 98 99  CO2 22 - 32 mmol/L 23 22 22   Calcium 8.9 - 10.3 mg/dL 9.1 9.1 9.0  Total Protein 6.5 - 8.1 g/dL - - 6.8  Total Bilirubin 0.3 - 1.2 mg/dL - - 1.9(H)  Alkaline Phos 38 - 126 U/L - - 118  AST 15 - 41 U/L - - 24  ALT 0 - 44 U/L - - 38    DG Chest 2 View  Result Date: 09/02/2021 CLINICAL DATA:  Shortness of breath EXAM: CHEST - 2 VIEW COMPARISON:  Chest x-ray dated August 18, 2021 FINDINGS:  Enlarged cardiac and mediastinal contours. Right chest wall dialysis catheter is unchanged position. Mild bilateral heterogeneous pulmonary opacities. No pleural effusion or pneumothorax. IMPRESSION: Cardiomegaly. Mild bilateral heterogeneous opacities, concerning for pulmonary edema. Electronically Signed   By: Yetta Glassman M.D.   On: 09/02/2021 11:48      Lajean Manes, MD Internal Medicine Resident, PGY-1 Pager: [phone]

## 2021-09-04 NOTE — Progress Notes (Signed)
°   09/03/21 2335  Assess: MEWS Score  Temp 99 F (37.2 C)  BP (!) 155/98  Pulse Rate (!) 102  Resp (!) 21  Level of Consciousness Alert  SpO2 97 %  O2 Device Room Air  Assess: MEWS Score  MEWS Temp 0  MEWS Systolic 0  MEWS Pulse 1  MEWS RR 1  MEWS LOC 0  MEWS Score 2  MEWS Score Color Yellow  Assess: if the MEWS score is Yellow or Red  Were vital signs taken at a resting state? Yes  Focused Assessment No change from prior assessment  Early Detection of Sepsis Score *See Row Information* Medium  MEWS guidelines implemented *See Row Information* Yes  Treat  MEWS Interventions Other (Comment);Escalated (See documentation below)  Pain Scale 0-10  Pain Score 0  Take Vital Signs  Increase Vital Sign Frequency  Yellow: Q 2hr X 2 then Q 4hr X 2, if remains yellow, continue Q 4hrs  Escalate  MEWS: Escalate Yellow: discuss with charge nurse/RN and consider discussing with provider and RRT  Notify: Charge Nurse/RN  Name of Charge Nurse/RN Notified Edgar  Date Charge Nurse/RN Notified 09/04/21  Time Charge Nurse/RN Notified 0010  Document  Patient Outcome Other (Comment) (pt stable and remains on unit for further reassessment)

## 2021-09-04 NOTE — Progress Notes (Signed)
° °  Palliative Medicine Inpatient Follow Up Note  HPI: 36 y.o. male  with past medical history of ESRD on HD, CHF (last ECHO showed 20% EF), OSA on CPAP, morbid obesity, DM2, HTN, MSSA bacteremia, admitted on  09/02/2021 with shortness of breath due to volume overload. Palliative medicine consulted per patient's request.    Today's Discussion (09/04/2021):  *Please note that this is a verbal dictation therefore any spelling or grammatical errors are due to the "[employer]" system interpretation.  Chart reviewed inclusive of vital signs, progress notes, laboratory results, and diagnostic images.   I met with Phillip Young at bedside. He was on CPAP though alert and oriented. We reviewed that presently it appears he will be discharged to home after dialysis. He expresses knowledge of this.   I asked Phillip Young if outpatient Palliative services may follow him to continue goals of care conversations which he is in agreement with this.  Questions and concerns addressed   Palliative Support Provided _____________________ Addendum:  I called patients mother, Phillip Young to  update her though she did not answer. I was not able to leave her a message as her VM was full.  Objective Assessment: Vital Signs Vitals:   09/04/21 0516 09/04/21 0931  BP: 117/86 116/80  Pulse: (!) 105 (!) 103  Resp: 18 17  Temp: 98 F (36.7 C) 98.8 F (37.1 C)  SpO2: 100% 100%    Intake/Output Summary (Last 24 hours) at 09/04/2021 1337 Last data filed at 09/04/2021 0800 Gross per 24 hour  Intake 660 ml  Output 2609 ml  Net -1949 ml   Last Weight  Most recent update: 09/03/2021  9:16 PM    Weight  187.3 kg (412 lb 14.8 oz)              Gen:  AA Adult in NAD HEENT: moist mucous membranes CV: Regular rate and rhythm  PULM:  On CPAP ABD: soft/nontender  EXT: No edema  Neuro: Alert and oriented x3   SUMMARY OF RECOMMENDATIONS   Full Code/Full Scope for the time being  Patient will discharge today  Have  requested TOC arrange with [facility] program to continue these essential goals of care conversations  MDM - Moderate  Medical Decision Making: 3 #/Complex Problems: 4                     Data Reviewed:    4             Management: 3 (1-Straightforward, 2-Low, 3-Moderate, 4-High) ______________________________________________________________________________________ [place] Team Team Cell Phone: [phone] Please utilize secure chat with additional questions, if there is no response within 30 minutes please call the above phone number  Palliative Medicine Team providers are available by phone from 7am to 7pm daily and can be reached through the team cell phone.  Should this patient require assistance outside of these hours, please call the patient's attending physician.

## 2021-09-04 NOTE — Plan of Care (Signed)
  Problem: Education: Goal: Knowledge of General Education information will improve Description: Including pain rating scale, medication(s)/side effects and non-pharmacologic comfort measures Outcome: Progressing   Problem: Clinical Measurements: Goal: Ability to maintain clinical measurements within normal limits will improve Outcome: Progressing   

## 2021-09-04 NOTE — Plan of Care (Signed)
Problem: Education: Goal: Knowledge of General Education information will improve Description: Including pain rating scale, medication(s)/side effects and non-pharmacologic comfort measures Outcome: Completed/Met   Problem: Health Behavior/Discharge Planning: Goal: Ability to manage health-related needs will improve Outcome: Completed/Met   Problem: Clinical Measurements: Goal: Ability to maintain clinical measurements within normal limits will improve Outcome: Completed/Met Goal: Will remain free from infection Outcome: Completed/Met Goal: Diagnostic test results will improve Outcome: Completed/Met Goal: Respiratory complications will improve Outcome: Completed/Met Goal: Cardiovascular complication will be avoided Outcome: Completed/Met   Problem: Activity: Goal: Risk for activity intolerance will decrease Outcome: Completed/Met   Problem: Nutrition: Goal: Adequate nutrition will be maintained Outcome: Completed/Met   Problem: Coping: Goal: Level of anxiety will decrease Outcome: Completed/Met   Problem: Elimination: Goal: Will not experience complications related to bowel motility Outcome: Completed/Met Goal: Will not experience complications related to urinary retention Outcome: Completed/Met   Problem: Pain Managment: Goal: General experience of comfort will improve Outcome: Completed/Met   Problem: Safety: Goal: Ability to remain free from injury will improve Outcome: Completed/Met   Problem: Skin Integrity: Goal: Risk for impaired skin integrity will decrease Outcome: Completed/Met   Problem: Education: Goal: Knowledge of disease and its progression will improve Outcome: Completed/Met Goal: Individualized Educational Video(s) Outcome: Completed/Met   Problem: Fluid Volume: Goal: Compliance with measures to maintain balanced fluid volume will improve Outcome: Completed/Met   Problem: Health Behavior/Discharge Planning: Goal: Ability to manage  health-related needs will improve Outcome: Completed/Met   Problem: Nutritional: Goal: Ability to make healthy dietary choices will improve Outcome: Completed/Met   Problem: Clinical Measurements: Goal: Complications related to the disease process, condition or treatment will be avoided or minimized Outcome: Completed/Met   

## 2021-09-04 NOTE — Progress Notes (Signed)
[facility] Progress Note   Subjective:    Had dialysis yesterday. Got 2.5L off before cramping. Didn't sleep well with CPAP overnight Breathing getting better but still with volume excess    Objective Vitals:   09/04/21 0110 09/04/21 0314 09/04/21 0516 09/04/21 0931  BP: (!) 124/102 (!) 111/96 117/86 116/80  Pulse: (!) 107 (!) 105 (!) 105 (!) 103  Resp: 19 17 18 17   Temp: 99 F (37.2 C) 98.8 F (37.1 C) 98 F (36.7 C) 98.8 F (37.1 C)  TempSrc:  Oral Oral Oral  SpO2: 98% 100% 100% 100%  Weight:       Physical Exam General:chronically ill appearing morbidly obese male in NAD Heart:RRR, no mrg Lungs:breath sounds decreased, +scattered rhonchi, increased WOB with talking Abdomen:soft, 1+edema on pannus  Extremities:trace to 1+ LE edema Dialysis Access: TDC, LU AVF maturing  Filed Weights   09/03/21 1745 09/03/21 2104  Weight: (!) 189.8 kg (!) 187.3 kg    Intake/Output Summary (Last 24 hours) at 09/04/2021 1014 Last data filed at 09/04/2021 0800 Gross per 24 hour  Intake 880 ml  Output 2609 ml  Net -1729 ml     Additional Objective Labs: Basic Metabolic Panel: Recent Labs  Lab 09/02/21 1030 09/03/21 0125 09/04/21 0306  NA 136 136 136  K 4.0 4.3 4.1  CL 99 98 99  CO2 22 22 23   GLUCOSE 142* 115* 99  BUN 61* 42* 38*  CREATININE 7.91* 6.45* 6.32*  CALCIUM 9.0 9.1 9.1  PHOS  --  3.8  --     Liver Function Tests: Recent Labs  Lab 09/02/21 1030 09/03/21 0125  AST 24  --   ALT 38  --   ALKPHOS 118  --   BILITOT 1.9*  --   PROT 6.8  --   ALBUMIN 3.2* 3.4*     CBC: Recent Labs  Lab 09/02/21 1030 09/03/21 0125 09/04/21 0306  WBC 7.9 9.3 9.1  HGB 9.2* 9.7* 9.4*  HCT 28.6* 30.1* 29.3*  MCV 99.0 96.8 98.7  PLT 248 237 234     DG Chest 2 View  Result Date: 09/02/2021 CLINICAL DATA:  Shortness of breath EXAM: CHEST - 2 VIEW COMPARISON:  Chest x-ray dated August 18, 2021 FINDINGS: Enlarged cardiac and mediastinal contours. Right  chest wall dialysis catheter is unchanged position. Mild bilateral heterogeneous pulmonary opacities. No pleural effusion or pneumothorax. IMPRESSION: Cardiomegaly. Mild bilateral heterogeneous opacities, concerning for pulmonary edema. Electronically Signed   By: Yetta Glassman M.D.   On: 09/02/2021 11:48    Medications:  anticoagulant sodium citrate      apixaban  5 mg Oral BID   cephALEXin  500 mg Oral Daily   chlorhexidine  15 mL Mouth Rinse BID   Chlorhexidine Gluconate Cloth  6 each Topical Q0600   Chlorhexidine Gluconate Cloth  6 each Topical Q0600   doxercalciferol  3 mcg Intravenous Q T,Th,Sa-HD   hydrOXYzine  25 mg Oral BID   lanthanum  1,000 mg Oral TID WC   mouth rinse  15 mL Mouth Rinse q12n4p   midodrine  10 mg Oral Q T,Th,Sa-HD    Dialysis Orders: TTS- [facility]  4hrs, BFR 450, DFR AF 1.5,  EDW 175kg, 4K/ 2.25Ca   Access: TDC, LU AVF maturing  Heparin NONE HIT+, use sodium citrate to lock catheter Mircera 60 mcg q2wks - last 53mcg on 08/06/21 Hectorol 3 mcg IV qHD     Assessment/Plan:  Volume overload/Pulmonary edema - 2/2 non compliance with  HD/fluid restrictions.  Reminded of fluid restrictions. Continue serial HD as schedule allows.   ESRD -  on HD TTS.  Had HD 2/8, 2/9. Extra HD today if schedule permits.   Hypotension - Blood pressure in goal.  On midodrine with HD.  Anemia of CKD - Hgb 9.2, will order ESA with HD tomorrow.   Secondary Hyperparathyroidism -  Ca in goal. Check phos.  Continue VDRA and binders.   Nutrition - Renal diet w/strict fluid restrictions HFrEF EF 25-30%,   Afib - on eliquis OSA - uses bipap at night     Lynnda Child PA-C [facility] 09/04/2021,10:16 AM

## 2021-09-05 DIAGNOSIS — N2581 Secondary hyperparathyroidism of renal origin: Secondary | ICD-10-CM | POA: Diagnosis not present

## 2021-09-05 DIAGNOSIS — L298 Other pruritus: Secondary | ICD-10-CM | POA: Diagnosis not present

## 2021-09-05 DIAGNOSIS — Z992 Dependence on renal dialysis: Secondary | ICD-10-CM | POA: Diagnosis not present

## 2021-09-05 DIAGNOSIS — T8249XA Other complication of vascular dialysis catheter, initial encounter: Secondary | ICD-10-CM | POA: Diagnosis not present

## 2021-09-05 DIAGNOSIS — N186 End stage renal disease: Secondary | ICD-10-CM | POA: Diagnosis not present

## 2021-09-07 ENCOUNTER — Telehealth: Payer: Self-pay | Admitting: Internal Medicine

## 2021-09-07 DIAGNOSIS — E662 Morbid (severe) obesity with alveolar hypoventilation: Secondary | ICD-10-CM | POA: Diagnosis not present

## 2021-09-07 DIAGNOSIS — I509 Heart failure, unspecified: Secondary | ICD-10-CM | POA: Diagnosis not present

## 2021-09-07 DIAGNOSIS — I5023 Acute on chronic systolic (congestive) heart failure: Secondary | ICD-10-CM | POA: Diagnosis not present

## 2021-09-07 DIAGNOSIS — J9610 Chronic respiratory failure, unspecified whether with hypoxia or hypercapnia: Secondary | ICD-10-CM | POA: Diagnosis not present

## 2021-09-07 NOTE — Telephone Encounter (Signed)
TOC HFU  Name: Maguire, Sime MRN: [id number]  Date: 09/15/2021 Status: Sch  Time: 10:45 AM Length: 30  Visit Type: OPEN ESTABLISHED [726] Copay: $30.00  Provider: Harvie Heck, MD

## 2021-09-08 DIAGNOSIS — N2581 Secondary hyperparathyroidism of renal origin: Secondary | ICD-10-CM | POA: Diagnosis not present

## 2021-09-08 DIAGNOSIS — T8249XA Other complication of vascular dialysis catheter, initial encounter: Secondary | ICD-10-CM | POA: Diagnosis not present

## 2021-09-08 DIAGNOSIS — E8779 Other fluid overload: Secondary | ICD-10-CM | POA: Diagnosis not present

## 2021-09-08 DIAGNOSIS — N186 End stage renal disease: Secondary | ICD-10-CM | POA: Diagnosis not present

## 2021-09-08 DIAGNOSIS — L298 Other pruritus: Secondary | ICD-10-CM | POA: Diagnosis not present

## 2021-09-08 DIAGNOSIS — Z992 Dependence on renal dialysis: Secondary | ICD-10-CM | POA: Diagnosis not present

## 2021-09-09 ENCOUNTER — Other Ambulatory Visit (HOSPITAL_COMMUNITY): Payer: Self-pay | Admitting: Adult Health

## 2021-09-09 ENCOUNTER — Ambulatory Visit: Payer: BC Managed Care – PPO | Admitting: Infectious Diseases

## 2021-09-09 DIAGNOSIS — L298 Other pruritus: Secondary | ICD-10-CM | POA: Diagnosis not present

## 2021-09-09 DIAGNOSIS — N2581 Secondary hyperparathyroidism of renal origin: Secondary | ICD-10-CM | POA: Diagnosis not present

## 2021-09-09 DIAGNOSIS — T8249XA Other complication of vascular dialysis catheter, initial encounter: Secondary | ICD-10-CM | POA: Diagnosis not present

## 2021-09-09 DIAGNOSIS — E8779 Other fluid overload: Secondary | ICD-10-CM | POA: Diagnosis not present

## 2021-09-09 DIAGNOSIS — Z992 Dependence on renal dialysis: Secondary | ICD-10-CM | POA: Diagnosis not present

## 2021-09-09 DIAGNOSIS — N186 End stage renal disease: Secondary | ICD-10-CM | POA: Diagnosis not present

## 2021-09-09 NOTE — Telephone Encounter (Signed)
Transition Care Management Follow-up Telephone Call Date of discharge and from where: Discharge 09/04/20 from the hospital. How have you been since you were released from the hospital? Pt is currently at dialysis.  Stated this is an emergency dialysis treatment d/t to shob and "overload". He went to dialysis yesterday as scheduled. Any questions or concerns? No  Items Reviewed: Did the pt receive and understand the discharge instructions provided? Yes  Medications obtained and verified? Yes  Other?  N/A Any new allergies since your discharge? No  Dietary orders reviewed? Yes Do you have support at home? Yes - lives with his mother and uncle.  Home Care and Equipment/Supplies: Were home health services ordered? not applicable   Functional Questionnaire: (I = Independent and D = Dependent) ADLs: I  Bathing/Dressing- I  Meal Prep- stated his uncle does most of the cooking.  Eating- I  Maintaining continence- I  Transferring/Ambulation- I  Managing Meds- I  Follow up appointments reviewed:  PCP Hospital f/u appt confirmed? Yes  Scheduled to see Dr Marva Panda on 09/15/21 @ [facility] f/u appt confirmed?  N/A. Are transportation arrangements needed? No - stated he drives himself "for now". If their condition worsens, is the pt aware to call PCP or go to the Emergency Dept.? Yes Was the patient provided with contact information for the PCP's office or ED? Yes Was to pt encouraged to call back with questions or concerns? Yes

## 2021-09-10 ENCOUNTER — Ambulatory Visit (HOSPITAL_BASED_OUTPATIENT_CLINIC_OR_DEPARTMENT_OTHER): Payer: BC Managed Care – PPO | Attending: Cardiology | Admitting: Cardiology

## 2021-09-10 ENCOUNTER — Other Ambulatory Visit: Payer: Self-pay

## 2021-09-10 DIAGNOSIS — G4733 Obstructive sleep apnea (adult) (pediatric): Secondary | ICD-10-CM | POA: Insufficient documentation

## 2021-09-10 DIAGNOSIS — I5022 Chronic systolic (congestive) heart failure: Secondary | ICD-10-CM | POA: Diagnosis not present

## 2021-09-10 DIAGNOSIS — Z6841 Body Mass Index (BMI) 40.0 and over, adult: Secondary | ICD-10-CM | POA: Insufficient documentation

## 2021-09-10 DIAGNOSIS — L298 Other pruritus: Secondary | ICD-10-CM | POA: Diagnosis not present

## 2021-09-10 DIAGNOSIS — E8779 Other fluid overload: Secondary | ICD-10-CM | POA: Diagnosis not present

## 2021-09-10 DIAGNOSIS — N186 End stage renal disease: Secondary | ICD-10-CM | POA: Diagnosis not present

## 2021-09-10 DIAGNOSIS — G4736 Sleep related hypoventilation in conditions classified elsewhere: Secondary | ICD-10-CM | POA: Insufficient documentation

## 2021-09-10 DIAGNOSIS — Z992 Dependence on renal dialysis: Secondary | ICD-10-CM | POA: Diagnosis not present

## 2021-09-10 DIAGNOSIS — T8249XA Other complication of vascular dialysis catheter, initial encounter: Secondary | ICD-10-CM | POA: Diagnosis not present

## 2021-09-10 DIAGNOSIS — N2581 Secondary hyperparathyroidism of renal origin: Secondary | ICD-10-CM | POA: Diagnosis not present

## 2021-09-11 NOTE — Procedures (Signed)
° °  Patient Name: Phillip Young, [place] Date:09/10/2021 Gender: Male D.O.B: [birth year] Age (years): 35 Referring Provider: [name] Height (inches): 61 Interpreting Physician: Fransico Him MD, ABSM Weight (lbs): 400 RPSGT: Baxter Flattery BMI: 82 MRN: [id number] Neck Size: 20.00  CLINICAL INFORMATION Sleep Study Type: NPSG  Indication for sleep study: Congestive Heart Failure, Fatigue, Obesity, OSA, Snoring, Witnessed Apneas  Epworth Sleepiness Score: 11  SLEEP STUDY TECHNIQUE As per the AASM Manual for the Scoring of Sleep and Associated Events v2.3 (April 2016) with a hypopnea requiring 4% desaturations.  The channels recorded and monitored were frontal, central and occipital EEG, electrooculogram (EOG), submentalis EMG (chin), nasal and oral airflow, thoracic and abdominal wall motion, anterior tibialis EMG, snore microphone, electrocardiogram, and pulse oximetry.  MEDICATIONS Medications self-administered by patient taken the night of the study : N/A  SLEEP ARCHITECTURE The study was initiated at 10:00:44 PM and ended at 11:28:01 PM.  Sleep onset time was 0.4 minutes and the sleep efficiency was 14.3%. The total sleep time was 12.5 minutes.  Stage REM latency was N/A minutes.  The patient spent 40.0% of the night in stage N1 sleep, 60.0% in stage N2 sleep, 0.0% in stage N3 and 0% in REM.  Alpha intrusion was absent.  Supine sleep was 0.00%.  RESPIRATORY PARAMETERS The overall apnea/hypopnea index (AHI) was 110.4 per hour. There were 17 total apneas, including 16 obstructive, 0 central and 1 mixed apneas. There were 6 hypopneas and 0 RERAs.  The AHI during Stage REM sleep was N/A per hour.  AHI while supine was N/A per hour.  The mean oxygen saturation was 88.0%. The minimum SpO2 during sleep was 71.0%.  loud snoring was noted during this study.  CARDIAC DATA The 2 lead EKG demonstrated sinus rhythm. The mean heart rate was 106.9 beats per minute. Other EKG  findings include: None.  LEG MOVEMENT DATA The total PLMS were 0 with a resulting PLMS index of 0.0. Associated arousal with leg movement index was 0.0 .  IMPRESSIONS - Severe obstructive sleep apnea occurred during this study (AHI = 110.4/h). - Moderate oxygen desaturation was noted during this study (Min O2 = 71.0%). - The patient snored with loud snoring volume. - No cardiac abnormalities were noted during this study. - Clinically significant periodic limb movements did not occur during sleep. No significant associated arousals.  DIAGNOSIS - Obstructive Sleep Apnea (G47.33) - Nocturnal Hypoxemia (G47.36)  RECOMMENDATIONS - Patient was unable to complete CPAP titration portion of study due to severe cough and SOB and had to sit in recliner - consider repeat full night study with PAP titration - will likely need BiPAP given severity of OSA.  - Avoid alcohol, sedatives and other CNS depressants that may worsen sleep apnea and disrupt normal sleep architecture. - Sleep hygiene should be reviewed to assess factors that may improve sleep quality. - Weight management and regular exercise should be initiated or continued if appropriate. - Recommend referral to Pulmonary as he will likely not tolerate PAP therapy with current respiratory issues.   [Electronically signed] 09/11/2021 01:59 PM  Fransico Him MD, ABSM Diplomate, American Board of Sleep Medicine

## 2021-09-12 DIAGNOSIS — Z992 Dependence on renal dialysis: Secondary | ICD-10-CM | POA: Diagnosis not present

## 2021-09-12 DIAGNOSIS — E8779 Other fluid overload: Secondary | ICD-10-CM | POA: Diagnosis not present

## 2021-09-12 DIAGNOSIS — L298 Other pruritus: Secondary | ICD-10-CM | POA: Diagnosis not present

## 2021-09-12 DIAGNOSIS — N186 End stage renal disease: Secondary | ICD-10-CM | POA: Diagnosis not present

## 2021-09-12 DIAGNOSIS — N2581 Secondary hyperparathyroidism of renal origin: Secondary | ICD-10-CM | POA: Diagnosis not present

## 2021-09-12 DIAGNOSIS — T8249XA Other complication of vascular dialysis catheter, initial encounter: Secondary | ICD-10-CM | POA: Diagnosis not present

## 2021-09-14 ENCOUNTER — Telehealth: Payer: Self-pay | Admitting: *Deleted

## 2021-09-14 DIAGNOSIS — G4733 Obstructive sleep apnea (adult) (pediatric): Secondary | ICD-10-CM

## 2021-09-14 NOTE — Telephone Encounter (Signed)
Message sent to sleep nurse to refer patient to Pulmonary sleep specialist.

## 2021-09-14 NOTE — Telephone Encounter (Signed)
Phillip Margarita, MD  Freada Bergeron, CMA  Gae Bon this patient has severe obstructive sleep apnea and could not be titrated and only slept 12 minutes.  I would like him referred to one of the sleep specialist with pulmonary for help with this patient who is very complicated

## 2021-09-15 ENCOUNTER — Ambulatory Visit (INDEPENDENT_AMBULATORY_CARE_PROVIDER_SITE_OTHER): Payer: BC Managed Care – PPO | Admitting: Internal Medicine

## 2021-09-15 ENCOUNTER — Telehealth: Payer: Self-pay

## 2021-09-15 VITALS — BP 103/71 | HR 107 | Temp 98.0°F | Wt >= 6400 oz

## 2021-09-15 DIAGNOSIS — I509 Heart failure, unspecified: Secondary | ICD-10-CM | POA: Diagnosis not present

## 2021-09-15 DIAGNOSIS — J9610 Chronic respiratory failure, unspecified whether with hypoxia or hypercapnia: Secondary | ICD-10-CM | POA: Diagnosis not present

## 2021-09-15 DIAGNOSIS — G4733 Obstructive sleep apnea (adult) (pediatric): Secondary | ICD-10-CM

## 2021-09-15 DIAGNOSIS — N2581 Secondary hyperparathyroidism of renal origin: Secondary | ICD-10-CM | POA: Diagnosis not present

## 2021-09-15 DIAGNOSIS — L282 Other prurigo: Secondary | ICD-10-CM

## 2021-09-15 DIAGNOSIS — T8249XA Other complication of vascular dialysis catheter, initial encounter: Secondary | ICD-10-CM | POA: Diagnosis not present

## 2021-09-15 DIAGNOSIS — J9621 Acute and chronic respiratory failure with hypoxia: Secondary | ICD-10-CM | POA: Diagnosis not present

## 2021-09-15 DIAGNOSIS — N186 End stage renal disease: Secondary | ICD-10-CM

## 2021-09-15 DIAGNOSIS — Z992 Dependence on renal dialysis: Secondary | ICD-10-CM | POA: Diagnosis not present

## 2021-09-15 DIAGNOSIS — L298 Other pruritus: Secondary | ICD-10-CM | POA: Diagnosis not present

## 2021-09-15 DIAGNOSIS — I5023 Acute on chronic systolic (congestive) heart failure: Secondary | ICD-10-CM | POA: Diagnosis not present

## 2021-09-15 MED ORDER — NALTREXONE HCL 50 MG PO TABS: 25.0000 mg | ORAL_TABLET | Freq: Every day | ORAL | 0 refills | Status: DC

## 2021-09-15 NOTE — Patient Instructions (Signed)
[name],  It was a pleasure seeing you in clinic. Today we discussed:   Diffuse itching: I am checking some lab work today to check for any liver abnormalities that could potentially be causing your itching. I will also reach out to your dialysis center to see if they can change your dialysis fluid.  Meanwhile, please start taking naltrexone 25mg  daily to help with the itching. Please let us know if no significant improvement or only partial improvement in symptoms.   If you have any questions or concerns, please call our clinic at [phone] between 9am-5pm and after hours call [phone] and ask for the internal medicine resident on call. If you feel you are having a medical emergency please call 911.   Thank you, we look forward to helping you remain healthy!

## 2021-09-15 NOTE — Telephone Encounter (Signed)
Referral has been placed. 

## 2021-09-15 NOTE — Telephone Encounter (Signed)
-----   Message from Freada Bergeron, [place] sent at 09/11/2021  6:59 PM EST ----- Regarding: Send Referral Thanks, Gae Bon ----- Message ----- From: Sueanne Margarita, MD Sent: 09/11/2021   2:07 PM EST To: Freada Bergeron, CMA  Gae Bon this patient has severe obstructive sleep apnea and could not be titrated and only slept 12 minutes.  I would like him referred to one of the sleep specialist with pulmonary for help with this patient who is very complicated

## 2021-09-15 NOTE — Progress Notes (Signed)
° °  CC: hospital follow up for hypervolemia; diffuse pruritis  HPI:  [name] is a 36 y.o. male with PMHx as stated below presenting for hospital follow up. Patient was hospitalized 2/8-2/10 for acute on chronic hypoxic respiratory failure 2/2 hypervolemia in setting of incomplete HD sessions and dietary indiscretion/noncompliance with fluid restriction. Patient received serial HD sessions and was discharged to home to follow regular dialysis schedule. Patient reports some improvement since discharge in his respiratory status and is back to his baseline 2L oxygen. However, notes significant ongoing diffuse pruritis, that is worse following dialysis sessions. Please see problem based charting for complete assessment and plan.  Past Medical History:  Diagnosis Date   Biventricular congestive heart failure ([place])    Last Echo 11/2019 at [facility] reveals EF 20%   Class 3 severe obesity due to excess calories with serious comorbidity and body mass index (BMI) of 50.0 to 59.9 in adult [facility]) 02/26/2020   Essential hypertension 02/26/2020   GERD without esophagitis 02/26/2020   Hidradenitis suppurativa 02/26/2020   OSA (obstructive sleep apnea) 02/26/2020   Prediabetes 02/26/2020   Renal disorder    Review of Systems:  Negative except as stated in HPI.  Physical Exam:  Vitals:   09/15/21 1052 09/15/21 1114  BP: (!) 71/49 103/71  Pulse: (!) 112 (!) 107  Temp: 98 F (36.7 C)   TempSrc: Oral   SpO2: 100% 100%  Weight: (!) 410 lb 9.6 oz (186.2 kg)    Physical Exam  Constitutional: Chronically ill appearing male, mild distress HENT: Normocephalic and atraumatic,  moist mucous membranes Cardiovascular: Slightly tachycardic, regular rhythm, S1 and S2 present, no murmurs, rubs, gallops.  Distal pulses intact Respiratory: Lungs CTAB; effort normal on 2L O2  GI: Distended but soft, nontender to palpation Musculoskeletal: Normal bulk and tone.  2+ pitting edema of bilat lower extremities   Neurological: Is alert and oriented x4, no apparent focal deficits noted. Skin: Warm and dry.  Diffuse excoriations  Psychiatric: Normal mood and affect.  Assessment & Plan:   See Encounters Tab for problem based charting.  Patient discussed with Dr. Daryll Drown

## 2021-09-16 ENCOUNTER — Ambulatory Visit: Payer: BC Managed Care – PPO | Admitting: Infectious Diseases

## 2021-09-16 LAB — CMP14 + ANION GAP
ALT: 24 IU/L (ref 0–44)
AST: 19 IU/L (ref 0–40)
Albumin/Globulin Ratio: 1.3 (ref 1.2–2.2)
Albumin: 4.3 g/dL (ref 4.0–5.0)
Alkaline Phosphatase: 171 IU/L — ABNORMAL HIGH (ref 44–121)
Anion Gap: 21 mmol/L — ABNORMAL HIGH (ref 10.0–18.0)
BUN/Creatinine Ratio: 6 — ABNORMAL LOW (ref 9–20)
BUN: 41 mg/dL — ABNORMAL HIGH (ref 6–20)
Bilirubin Total: 1.1 mg/dL (ref 0.0–1.2)
CO2: 20 mmol/L (ref 20–29)
Calcium: 9 mg/dL (ref 8.7–10.2)
Chloride: 94 mmol/L — ABNORMAL LOW (ref 96–106)
Creatinine, Ser: 6.42 mg/dL — ABNORMAL HIGH (ref 0.76–1.27)
Globulin, Total: 3.2 g/dL (ref 1.5–4.5)
Glucose: 90 mg/dL (ref 70–99)
Potassium: 4.3 mmol/L (ref 3.5–5.2)
Sodium: 135 mmol/L (ref 134–144)
Total Protein: 7.5 g/dL (ref 6.0–8.5)
eGFR: 11 mL/min/{1.73_m2} — ABNORMAL LOW (ref >59)

## 2021-09-17 DIAGNOSIS — L298 Other pruritus: Secondary | ICD-10-CM | POA: Diagnosis not present

## 2021-09-17 DIAGNOSIS — N2581 Secondary hyperparathyroidism of renal origin: Secondary | ICD-10-CM | POA: Diagnosis not present

## 2021-09-17 DIAGNOSIS — N186 End stage renal disease: Secondary | ICD-10-CM | POA: Diagnosis not present

## 2021-09-17 DIAGNOSIS — Z992 Dependence on renal dialysis: Secondary | ICD-10-CM | POA: Diagnosis not present

## 2021-09-17 DIAGNOSIS — L282 Other prurigo: Secondary | ICD-10-CM | POA: Insufficient documentation

## 2021-09-17 DIAGNOSIS — T8249XA Other complication of vascular dialysis catheter, initial encounter: Secondary | ICD-10-CM | POA: Diagnosis not present

## 2021-09-17 NOTE — Progress Notes (Signed)
Internal Medicine Clinic Attending  Case discussed with Dr. Aslam at the time of the visit.  We reviewed the resident's history and exam and pertinent patient test results.  I agree with the assessment, diagnosis, and plan of care documented in the resident's note.  

## 2021-09-17 NOTE — Assessment & Plan Note (Signed)
Patient recently hospitalized for hypervolemia in setting of incomplete HD sessions and dietary indiscretion/nonadherence to fluid restriction. He received serial HD sessions and was discharged to follow up at his outpatient dialysis center. Patient was discharged at 186kg (dry weight is 175kg). He is 186kg today following his HD session. Continues to appear volume overloaded on exam; however, no need for emergent HD session at this time.   Plan: Continue HD as scheduled Follow up with nephrology

## 2021-09-17 NOTE — Assessment & Plan Note (Signed)
Patient was hospitalized 2/8-2/10 for acute on chronic hypoxic respiratory failure 2/2 hypervolemia in setting of incomplete HD sessions and dietary indiscretion/noncompliance with fluid restriction. Patient received serial HD sessions and was discharged to home to follow regular dialysis schedule. Patient reports improvement in respiratory status and is currently on home 3L O2. Lungs with improved aeration on exam.   Plan: Continue hemodialysis for volume overload per schedule Continue oxygen supplementation

## 2021-09-17 NOTE — Assessment & Plan Note (Signed)
Patient is endorsing diffuse pruritis that has been persistent since discharge from the hospital. He was prescribed atarax and advised for topical emollient creams on discharge; however, reports that this is not helping anymore. He is using Sarna cream. Patient appears distressed due to ongoing pruritis. Prior labs with elevated total bilirubin; due to concern for cholestatic pruritis, obtained repeat CMP. Noted improvement in total bilirubin levels but does have isolated elevation in Alk phos. No other significant electrolyte abnormalities noted.  Discussed with patient's nephrologist, Dr Joelyn Oms, for possible change in diasylate as patient may be allergic. Advised that this is uremic pruritis. They are working on getting him Melburn Hake to help treat this; however, this has been difficult due to insurance.  At this time, recommended for gabapentin instead of naltrexone.   Plan: Naltrexone changed to gabapentin per nephrology recommendations Melburn Hake per nephrology, once improved by insurance

## 2021-09-18 ENCOUNTER — Telehealth (HOSPITAL_COMMUNITY): Payer: Self-pay

## 2021-09-18 DIAGNOSIS — I5023 Acute on chronic systolic (congestive) heart failure: Secondary | ICD-10-CM | POA: Diagnosis not present

## 2021-09-18 DIAGNOSIS — I5082 Biventricular heart failure: Secondary | ICD-10-CM | POA: Diagnosis not present

## 2021-09-18 NOTE — Telephone Encounter (Signed)
Called and was unable to leave a message to confirm/remind patient of their appointment at the [facility] on 09/21/21.

## 2021-09-19 DIAGNOSIS — T8249XA Other complication of vascular dialysis catheter, initial encounter: Secondary | ICD-10-CM | POA: Diagnosis not present

## 2021-09-19 DIAGNOSIS — Z992 Dependence on renal dialysis: Secondary | ICD-10-CM | POA: Diagnosis not present

## 2021-09-19 DIAGNOSIS — L298 Other pruritus: Secondary | ICD-10-CM | POA: Diagnosis not present

## 2021-09-19 DIAGNOSIS — N2581 Secondary hyperparathyroidism of renal origin: Secondary | ICD-10-CM | POA: Diagnosis not present

## 2021-09-19 DIAGNOSIS — N186 End stage renal disease: Secondary | ICD-10-CM | POA: Diagnosis not present

## 2021-09-21 ENCOUNTER — Encounter (HOSPITAL_COMMUNITY): Payer: Self-pay

## 2021-09-21 ENCOUNTER — Other Ambulatory Visit: Payer: Self-pay

## 2021-09-21 ENCOUNTER — Ambulatory Visit (HOSPITAL_COMMUNITY)
Admission: RE | Admit: 2021-09-21 | Discharge: 2021-09-21 | Disposition: A | Discharge status: Home / Self Care | Payer: BC Managed Care – PPO | Source: Ambulatory Visit | Attending: Family Medicine | Admitting: Family Medicine

## 2021-09-21 VITALS — BP 128/82 | HR 98 | Ht 72.0 in | Wt >= 6400 oz

## 2021-09-21 DIAGNOSIS — G4733 Obstructive sleep apnea (adult) (pediatric): Secondary | ICD-10-CM | POA: Diagnosis not present

## 2021-09-21 DIAGNOSIS — Z7901 Long term (current) use of anticoagulants: Secondary | ICD-10-CM | POA: Diagnosis not present

## 2021-09-21 DIAGNOSIS — Z5941 Food insecurity: Secondary | ICD-10-CM | POA: Insufficient documentation

## 2021-09-21 DIAGNOSIS — N179 Acute kidney failure, unspecified: Secondary | ICD-10-CM | POA: Insufficient documentation

## 2021-09-21 DIAGNOSIS — J9621 Acute and chronic respiratory failure with hypoxia: Secondary | ICD-10-CM | POA: Diagnosis not present

## 2021-09-21 DIAGNOSIS — I480 Paroxysmal atrial fibrillation: Secondary | ICD-10-CM | POA: Diagnosis not present

## 2021-09-21 DIAGNOSIS — I5022 Chronic systolic (congestive) heart failure: Secondary | ICD-10-CM

## 2021-09-21 DIAGNOSIS — N186 End stage renal disease: Secondary | ICD-10-CM | POA: Insufficient documentation

## 2021-09-21 DIAGNOSIS — Z8679 Personal history of other diseases of the circulatory system: Secondary | ICD-10-CM | POA: Diagnosis not present

## 2021-09-21 DIAGNOSIS — I420 Dilated cardiomyopathy: Secondary | ICD-10-CM | POA: Diagnosis not present

## 2021-09-21 DIAGNOSIS — I5082 Biventricular heart failure: Secondary | ICD-10-CM | POA: Diagnosis not present

## 2021-09-21 DIAGNOSIS — R000 Tachycardia, unspecified: Secondary | ICD-10-CM | POA: Diagnosis not present

## 2021-09-21 DIAGNOSIS — Z79899 Other long term (current) drug therapy: Secondary | ICD-10-CM | POA: Diagnosis not present

## 2021-09-21 DIAGNOSIS — Z9114 Patient's other noncompliance with medication regimen: Secondary | ICD-10-CM | POA: Diagnosis not present

## 2021-09-21 DIAGNOSIS — Z9989 Dependence on other enabling machines and devices: Secondary | ICD-10-CM | POA: Diagnosis not present

## 2021-09-21 DIAGNOSIS — R7989 Other specified abnormal findings of blood chemistry: Secondary | ICD-10-CM | POA: Insufficient documentation

## 2021-09-21 DIAGNOSIS — E662 Morbid (severe) obesity with alveolar hypoventilation: Secondary | ICD-10-CM | POA: Diagnosis not present

## 2021-09-21 DIAGNOSIS — T45516A Underdosing of anticoagulants, initial encounter: Secondary | ICD-10-CM | POA: Diagnosis not present

## 2021-09-21 DIAGNOSIS — I132 Hypertensive heart and chronic kidney disease with heart failure and with stage 5 chronic kidney disease, or end stage renal disease: Secondary | ICD-10-CM | POA: Diagnosis not present

## 2021-09-21 DIAGNOSIS — Z91119 Patient's noncompliance with dietary regimen due to unspecified reason: Secondary | ICD-10-CM | POA: Diagnosis not present

## 2021-09-21 DIAGNOSIS — Z9981 Dependence on supplemental oxygen: Secondary | ICD-10-CM | POA: Diagnosis not present

## 2021-09-21 DIAGNOSIS — F1090 Alcohol use, unspecified, uncomplicated: Secondary | ICD-10-CM | POA: Insufficient documentation

## 2021-09-21 DIAGNOSIS — Z6841 Body Mass Index (BMI) 40.0 and over, adult: Secondary | ICD-10-CM | POA: Insufficient documentation

## 2021-09-21 DIAGNOSIS — E1122 Type 2 diabetes mellitus with diabetic chronic kidney disease: Secondary | ICD-10-CM | POA: Diagnosis not present

## 2021-09-21 DIAGNOSIS — I4892 Unspecified atrial flutter: Secondary | ICD-10-CM | POA: Insufficient documentation

## 2021-09-21 DIAGNOSIS — Z992 Dependence on renal dialysis: Secondary | ICD-10-CM

## 2021-09-21 DIAGNOSIS — I428 Other cardiomyopathies: Secondary | ICD-10-CM | POA: Insufficient documentation

## 2021-09-21 NOTE — Progress Notes (Addendum)
PCP: [name], MD Nephrology: Dr. Joelyn Oms HF Cardiology: Dr. [name]  HPI: 36 y.o. with history of cardiomyopathy and obesity.  He has been known to have a cardiomyopathy (biventricular failure) since back in 2019, echo in 2019 with EF 20% [facility]).  He has been followed in the [facility] system.  Admitted to [facility] in 1/61 with A/C Systolic HF. Diuresed with IV lasix and transitioned to torsemide. Placed on milrinone with low output and had RHC/LHC. Cath showed no CAD and volume overload.  Blood gas revealed a CO2 level elevated to 78. CPAP was initiated.   He was unable to tolerate Coreg due to wheezing and diarrhea.   Echo in 11/21 showed EF 20-25%, RV poorly visualized.  He was seen by EP, thought to be too large for cardiac MRI.   Patient was admitted in 7/22 with cardiogenic shock and AKI.  He ended up on CVVH.  He had a prolonged hospitalization for volume removal and titration off inotropes/pressors.  Eventually he was able to transition to [facility] and was discharged. Hospitalization was complicated by AF with RVR as well as HIT.  He was cardioverted several times.  Echo in 7/22 showed EF < 20%, severe LV enlargement, normal RV.   Admitted 9/22 with MSSA bacteremia due to infected HD catheter. TEE showed EF 20%, severely decreased LV with global HK, moderately reduced RV, no vegetation or thrombus noted. Hospitalization complicated by septic arthritis and muscle abscess. IV abx per ID.  Admitted 1/23 at [facility] with a/c CHF.  Re-admitted shortly thereafter after presenting to HD tachycardic and tachypneic. HD staff unable to access his dialysis catheter. He underwent a tunnel catheter exchange and taken for AV fistula creation.   Admitted 2/23 for acute on chronic hypoxic respiratory failure and a/c CHF in setting of incomplete HD sessions and dietary indiscretion/noncompliance with fluid restriction. He received serial HD sessions and was seen by PMT for [place], he  remained full code.   Today he returns for post hospital HF follow up. Overall feeling fine. He has occasional dizziness. Able to get around and complete job responsibilities without significant dyspnea. Denies palpitations, CP, edema, or PND/Orthopnea. Appetite ok. No fever or chills. Weight is stable at HD. Taking all medications. He wears CPAP at home.HD T/TH/Sat. Drinking "sips" tequila daily. Drives a bus for the city.  Labs (10/21): K 3.7, creatinine 1.07 Labs (2/23): K 4.3, creatinine 6.42  ECG (personally reviewed): initially appearing ST, but after review appears AFL, QTC 570 msec  PMH: 1. Hidradenitis suppuritiva 2. GERD 3. OHS/OSA  4. Diabetes 5. Morbid obesity 6. Fe deficiency anemia 7. Chronic systolic CHF: Nonischemic cardiomyopathy.  No history of ETOH or drugs.  +ANA.  No strong FH of cardiomyopathy.  - Echo (10/19, [facility]): EF 20%.  - Echo (5/21, [facility]): EF 20% - Echo (8/21): EF < 20%, moderate LV dilation, severely decreased RV function with severe RV dilation, severe biatrial enlargement, mild MR.  - LHC/RHC (8/21): No significant coronary disease; mean RA 19, PA 71/20 mean 39, mean PCWP 23, CI 2.1, PVR 2.5 WU.  - Echo (11/21): EF 20-25%, RV poorly visualized.  - Echo (7/22): EF < 20%, severely dilated LV.  8. Prior smoker.  9. ESRD 10. Atrial fibrillation: Paroxysmal.  11. History of HIT.   ROS: All systems negative except as listed in HPI, PMH and Problem List.  Social History   Socioeconomic History   Marital status: Single    Spouse name: Not  on file   Number of children: Not on file   Years of education: Not on file   Highest education level: Not on file  Occupational History   Not on file  Tobacco Use   Smoking status: Former    Packs/day: 1.00    Types: Cigarettes    Quit date: 2019    Years since quitting: 4.1    Passive exposure: Current   Smokeless tobacco: Former   Tobacco comments:    quit in 2019  Substance and Sexual  Activity   Alcohol use: Never   Drug use: Never   Sexual activity: Not on file  Other Topics Concern   Not on file  Social History Narrative   Not on file   Social Determinants of Health   Financial Resource Strain: Low Risk    Difficulty of Paying Living Expenses: Not very hard  Food Insecurity: Food Insecurity Present   Worried About [place] in the Last Year: Never true   Ran Out of Food in the Last Year: Sometimes true  Transportation Needs: No Transportation Needs   Lack of Transportation (Medical): No   Lack of Transportation (Non-Medical): No  Physical Activity: Not on file  Stress: Not on file  Social Connections: Not on file  Intimate Partner Violence: Not on file   Family History  Problem Relation Age of Onset   Heart disease Mother    Hypertension Mother    Pulmonary Hypertension Mother    Drug abuse Father        died due to Heroin overdose   Past Medical History:  Diagnosis Date   Biventricular congestive heart failure ([place])    Last Echo 11/2019 at [facility] reveals EF 20%   Class 3 severe obesity due to excess calories with serious comorbidity and body mass index (BMI) of 50.0 to 59.9 in adult [facility]) 02/26/2020   Essential hypertension 02/26/2020   GERD without esophagitis 02/26/2020   Hidradenitis suppurativa 02/26/2020   OSA (obstructive sleep apnea) 02/26/2020   Prediabetes 02/26/2020   Renal disorder    Current Outpatient Medications  Medication Sig Dispense Refill   benzonatate (TESSALON) 200 MG capsule Take 1 capsule (200 mg total) by mouth 3 (three) times daily as needed for cough. 30 capsule 0   cephALEXin (KEFLEX) 500 MG capsule Take 1 capsule (500 mg total) by mouth daily. Take 1 at bedtime every day. 30 capsule 2   ELIQUIS 5 MG TABS tablet TAKE 1 TABLET(5 MG) BY MOUTH TWICE DAILY 180 tablet 3   gabapentin (NEURONTIN) 100 MG capsule Take 100 mg by mouth 3 (three) times daily.     lanthanum (FOSRENOL) 500 MG chewable tablet Chew 500  mg by mouth 3 (three) times daily with meals.     No current facility-administered medications for this encounter.   BP 128/82    Pulse 98    Ht 6' (1.829 m)    Wt (!) 189.6 kg (418 lb)    SpO2 93%    BMI 56.69 kg/m   Wt Readings from Last 3 Encounters:  09/21/21 (!) 189.6 kg (418 lb)  09/15/21 (!) 186.2 kg (410 lb 9.6 oz)  09/10/21 (!) 181.4 kg (400 lb)   PHYSICAL EXAM: General:  NAD. No resp difficulty HEENT: Normal Neck: Supple. Thick neck. Carotids 2+ bilat; no bruits. No lymphadenopathy or thryomegaly appreciated. Cor: PMI nondisplaced. Regular rate & rhythm. No rubs, gallops or murmurs. Lungs: Clear; right HD catheter looks ok Abdomen: Obese, nontender, nondistended.  No hepatosplenomegaly. No bruits or masses. Good bowel sounds. Extremities: No cyanosis, clubbing, rash, edema Neuro: Alert & oriented x 3, cranial nerves grossly intact. Moves all 4 extremities w/o difficulty. Affect pleasant.  ASSESSMENT & PLAN: 1. Chronic Biventricular Heart Failure:  Known NICM since 2019, previously followed by [facility].  Echo 8/21 EF < 20%, moderate LV dilation, severely decreased RV function, severe biatrial enlargement, mild MR. No history of ETOH/drugs.  No FH of cardiomyopathy (mother with [place]).  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  He is too large for cardiac MRI. RHC/LHC on 03/04/20 with no CAD low CI at 2.1. Evaluated by EP but not a candidate for ICD given BMI.  Echo in 7/22 with EF < 20%, RV poorly visualized.  Developed cardiogenic shock 7/22 admission requiring pressors/inotropes and had AKI progressing to ESRD.  He titrated off inotropes/pressors.  Now off midodrine and stable BP.   - Continue HD for volume control, he is tolerating.   - He has been referred for cardiac rehab.   2. Atrial fibrillation: Paroxysmal.  DCCV several times in 8/22 admission.    - He was taken off amiodarone recently due to elevated LFTs. LFTs normalized.  Looks like he was in AFL on ECG 09/02/21. - Restart amiodarone 200 mg bid. Will re-check ECG in 2 weeks, if remains in AFL will arrange for DCCV with Dr. [name]. - Continue Eliquis. No bleeding issues. 3. ESRD: So far, tolerating HD and getting fluid off effectively.    - LUE fistula failed to mature. 4. OHS/OSA: Using 2 L home oxygen.  He wears CPAP nightly. 5. Super Morbid Obesity: Body mass index is 56.69 kg/m. - Refer to pharmacy clinic for semaglutide.   ECG in 2 weeks, if remains in AFL arrange for DCCV with Dr. [name].  Followup in 2 months with Dr. [name]. He was given a note today for his employer.   [place] FNP 09/21/2021  Addendum: Discussed with Dr. [name], will not initiate amiodarone and instead arrange for TEE/DCCV (has not been compliant with Eliquis), then refer to EP for possible ablation.

## 2021-09-21 NOTE — Patient Instructions (Signed)
Thank you for your visit.   Your physician recommends that you schedule a follow-up appointment in: 3 months.  If you have any questions or concerns before your next appointment please send [place] a message through [place] or call our office at [phone].    TO LEAVE A MESSAGE FOR THE NURSE SELECT OPTION 2, PLEASE LEAVE A MESSAGE INCLUDING: YOUR NAME DATE OF BIRTH CALL BACK NUMBER REASON FOR CALL**this is important as we prioritize the call backs  YOU WILL RECEIVE A CALL BACK THE SAME DAY AS LONG AS YOU CALL BEFORE 4:00 PM  At the [facility], you and your health needs are our priority. As part of our continuing mission to provide you with exceptional heart care, we have created designated Provider Care Teams. These Care Teams include your primary Cardiologist (physician) and Advanced Practice Providers (APPs- Physician Assistants and Nurse Practitioners) who all work together to provide you with the care you need, when you need it.   You may see any of the following providers on your designated Care Team at your next follow up: Dr Glori Bickers Dr Haynes Kerns, NP Lyda Jester, [place] [facility], [place] Audry Riles, PharmD   Please be sure to bring in all your medications bottles to every appointment.

## 2021-09-22 ENCOUNTER — Encounter (HOSPITAL_COMMUNITY): Payer: Self-pay

## 2021-09-22 ENCOUNTER — Telehealth (HOSPITAL_COMMUNITY): Payer: Self-pay | Admitting: Family Medicine

## 2021-09-22 ENCOUNTER — Telehealth: Payer: Self-pay | Admitting: Student-PharmD

## 2021-09-22 ENCOUNTER — Other Ambulatory Visit (HOSPITAL_COMMUNITY): Payer: Self-pay | Admitting: Family Medicine

## 2021-09-22 DIAGNOSIS — E1122 Type 2 diabetes mellitus with diabetic chronic kidney disease: Secondary | ICD-10-CM | POA: Diagnosis not present

## 2021-09-22 DIAGNOSIS — I480 Paroxysmal atrial fibrillation: Secondary | ICD-10-CM

## 2021-09-22 DIAGNOSIS — Z992 Dependence on renal dialysis: Secondary | ICD-10-CM | POA: Diagnosis not present

## 2021-09-22 DIAGNOSIS — N186 End stage renal disease: Secondary | ICD-10-CM | POA: Diagnosis not present

## 2021-09-22 DIAGNOSIS — N2581 Secondary hyperparathyroidism of renal origin: Secondary | ICD-10-CM | POA: Diagnosis not present

## 2021-09-22 DIAGNOSIS — T8249XA Other complication of vascular dialysis catheter, initial encounter: Secondary | ICD-10-CM | POA: Diagnosis not present

## 2021-09-22 MED ORDER — AMIODARONE HCL 200 MG PO TABS: 200.0000 mg | ORAL_TABLET | Freq: Two times a day (BID) | ORAL | 3 refills | Status: DC

## 2021-09-22 NOTE — Telephone Encounter (Signed)
Received referral from [facility], NP, to start semaglutide for this patient for weight loss. They meet FDA approved criteria for semaglutide for use in obesity given BMI 56.69. Obesity is complicated by chronic conditions including OSA, prediabetes, HF. No contraindications seen in the chart to [facility] use. Patient is ESRD on HD but this is not a contraindication to use. May be more susceptible to adverse effects and if started, could slow titration down to help mitigate.   Patient was initially referred 03/2021 but his plan did not cover weight loss medications. He still has the same insurance but will resubmit PA to see if his plan covers weight loss medications this year.   Submitted PA for [facility] 09/22/21.

## 2021-09-22 NOTE — Progress Notes (Signed)
Called patient and discussed AFL on ECG. He will restart his amiodarone 200 mg bid and return for follow up on 10/07/21. If he remains in AFL, will arranged DCCV with Dr. [name]. He understands and is agreeable with plan. Med list  updated and Rx sent to pharmacy.  Allena Katz, FNP-BC 09/22/21

## 2021-09-22 NOTE — Telephone Encounter (Signed)
Received fax that plan does not cover weight loss medications.

## 2021-09-22 NOTE — Addendum Note (Signed)
Encounter addended by: Rafael Bihari, FNP on: 09/22/2021 7:56 AM  Actions taken: Clinical Note Signed

## 2021-09-22 NOTE — Addendum Note (Signed)
Encounter addended by: Rafael Bihari, FNP on: 09/22/2021 9:32 AM  Actions taken: Clinical Note Signed

## 2021-09-22 NOTE — Addendum Note (Signed)
Encounter addended by: Rafael Bihari, FNP on: 09/22/2021 9:24 AM  Actions taken: Clinical Note Signed

## 2021-09-22 NOTE — Telephone Encounter (Signed)
Will defer amiodarone and proceed to DCCV. Will also refer to EP for possible ablation consideration. Discussed with Dr. [name]. Patient called and made aware. Upon further questioning, he has missed some doses of his Eliquis. Will arrange TEE and DCCV. Instructed to take Eliquis as directed.  Allena Katz, FNP-BC 09/22/21

## 2021-09-22 NOTE — Telephone Encounter (Signed)
-----   Message from Leeroy Bock, [place] sent at 09/21/2021  4:34 PM EST ----- Looks like he was referred before and his plan didn't cover weight loss drugs, can see if his plan changed coverage for this year ----- Message ----- From: Jerl Mina, RN Sent: 09/21/2021   4:18 PM EST To: Leeroy Bock, RPH-CPP  Wants to start on Segmaglutide

## 2021-09-23 ENCOUNTER — Other Ambulatory Visit (HOSPITAL_COMMUNITY): Payer: Self-pay | Admitting: *Deleted

## 2021-09-23 ENCOUNTER — Other Ambulatory Visit: Payer: Self-pay

## 2021-09-23 DIAGNOSIS — I5023 Acute on chronic systolic (congestive) heart failure: Secondary | ICD-10-CM | POA: Diagnosis not present

## 2021-09-23 DIAGNOSIS — I480 Paroxysmal atrial fibrillation: Secondary | ICD-10-CM

## 2021-09-23 DIAGNOSIS — I5082 Biventricular heart failure: Secondary | ICD-10-CM | POA: Diagnosis not present

## 2021-09-23 DIAGNOSIS — N186 End stage renal disease: Secondary | ICD-10-CM

## 2021-09-23 NOTE — Telephone Encounter (Signed)
Called patient to inform him that Phillip Young is still not covered on his plan this year. Ozempic was covered last year since he has had A1c's in the diabetic range and this was sent in for him. He was scheduled for an office visit but he no showed this and Ozempic is no longer on his med list. It appears per chart review this was due to cost which is unlikely to have changed this year since his plan is still the same. Tried to ask patient if there was a reason he did not start Ozempic to learn more. Patient cut me off, stated he was busy at work. Provided phone number and let patient know that if he is interested in discussing this further then he can call me back.  ?

## 2021-09-24 ENCOUNTER — Telehealth (HOSPITAL_COMMUNITY): Payer: Self-pay | Admitting: *Deleted

## 2021-09-24 DIAGNOSIS — N186 End stage renal disease: Secondary | ICD-10-CM | POA: Diagnosis not present

## 2021-09-24 DIAGNOSIS — N2581 Secondary hyperparathyroidism of renal origin: Secondary | ICD-10-CM | POA: Diagnosis not present

## 2021-09-24 DIAGNOSIS — Z992 Dependence on renal dialysis: Secondary | ICD-10-CM | POA: Diagnosis not present

## 2021-09-24 DIAGNOSIS — T8249XA Other complication of vascular dialysis catheter, initial encounter: Secondary | ICD-10-CM | POA: Diagnosis not present

## 2021-09-24 NOTE — Addendum Note (Signed)
Addended by: Scarlette Calico on: 09/24/2021 02:12 PM ? ? Modules accepted: Orders ? ?

## 2021-09-25 ENCOUNTER — Telehealth: Payer: Self-pay | Admitting: *Deleted

## 2021-09-25 NOTE — Telephone Encounter (Signed)
Patient called in, audibly Phillip Young, c/o "heart flutters," and cough that leads to vomiting. He is advised to head directly to ED. He made appt for 3/6 at 1315 and will keep this appt if he is not admitted. ?

## 2021-09-26 DIAGNOSIS — N186 End stage renal disease: Secondary | ICD-10-CM | POA: Diagnosis not present

## 2021-09-26 DIAGNOSIS — T8249XA Other complication of vascular dialysis catheter, initial encounter: Secondary | ICD-10-CM | POA: Diagnosis not present

## 2021-09-26 DIAGNOSIS — N2581 Secondary hyperparathyroidism of renal origin: Secondary | ICD-10-CM | POA: Diagnosis not present

## 2021-09-26 DIAGNOSIS — Z992 Dependence on renal dialysis: Secondary | ICD-10-CM | POA: Diagnosis not present

## 2021-09-28 ENCOUNTER — Ambulatory Visit (INDEPENDENT_AMBULATORY_CARE_PROVIDER_SITE_OTHER): Payer: BC Managed Care – PPO | Admitting: Internal Medicine

## 2021-09-28 DIAGNOSIS — R053 Chronic cough: Secondary | ICD-10-CM | POA: Diagnosis not present

## 2021-09-28 DIAGNOSIS — R1110 Vomiting, unspecified: Secondary | ICD-10-CM | POA: Insufficient documentation

## 2021-09-28 DIAGNOSIS — I5023 Acute on chronic systolic (congestive) heart failure: Secondary | ICD-10-CM | POA: Diagnosis not present

## 2021-09-28 MED ORDER — BENZONATATE 200 MG PO CAPS: 200.0000 mg | ORAL_CAPSULE | Freq: Three times a day (TID) | ORAL | 0 refills | Status: DC | PRN

## 2021-09-28 NOTE — Progress Notes (Signed)
Internal Medicine Clinic Attending  Case discussed with Dr. Patel  At the time of the visit.  We reviewed the resident's history and exam and pertinent patient test results.  I agree with the assessment, diagnosis, and plan of care documented in the resident's note.  

## 2021-09-28 NOTE — Assessment & Plan Note (Signed)
Advised him to obtain COVID and Influenza vaccine at local pharmacy or healthcare dept, he express understanding  ?Provided work not for missed days d/t ongoing AoC health conditions and various specialist appointments this month  ?Foot exam due at next visit  ?

## 2021-09-28 NOTE — Progress Notes (Signed)
? ?  CC: SHOB, vomiting, and cough ? ?This is a telephone encounter between Darrin Koman and Lajean Manes on 09/28/2021 for stated above. The visit was conducted with the patient located at home and [facility] at [facility]. The patient's identity was confirmed using their DOB and current address. The patient has consented to being evaluated through a telephone encounter and understands the associated risks (an examination cannot be done and the patient may need to come in for an appointment) / benefits (allows the patient to remain at home, decreasing exposure to coronavirus). I personally spent 10 minutes on medical discussion.  ? ?HPI: ? ?Mr.Montay Vanvoorhis is a 36 y.o. with PMH as below.  ? ?Please see A&P for assessment of the patient's acute and chronic medical conditions.  ? ?Past Medical History:  ?Diagnosis Date  ? Biventricular congestive heart failure ([place])   ? Last Echo 11/2019 at [facility] reveals EF 20%  ? Class 3 severe obesity due to excess calories with serious comorbidity and body mass index (BMI) of 50.0 to 59.9 in adult [facility]) 02/26/2020  ? Essential hypertension 02/26/2020  ? GERD without esophagitis 02/26/2020  ? Hidradenitis suppurativa 02/26/2020  ? OSA (obstructive sleep apnea) 02/26/2020  ? Prediabetes 02/26/2020  ? Renal disorder   ? ?Review of Systems:  ROS negative except for what is noted on the assessment and plan ? ? ?Assessment & Plan:  ? ?See Encounters Tab for problem based charting. ? ?Patient discussed with Dr. Philipp Ovens  ? ?Lajean Manes, MD ?Internal Medicine Resident, PGY-1 ?Internal Medicine Clinic  ?

## 2021-09-28 NOTE — Assessment & Plan Note (Addendum)
Reports adhering to fluid restriction-- is drinking 750 mL a day. Has been compliant with HD sessions.  Congratulated him on adherence. Does complain of SHOB, which he reports is close to his baseline. He has no new CP, fatigue, or change in mental status. Reports that his current weight is 180 kg, down from 190 kg recorded on 2/27. Advised pt to continue with fluid restriction and HD and he express understanding. Advised him to go to he ED if he notices new or worsening SHOB or CP.  ?

## 2021-09-28 NOTE — Assessment & Plan Note (Signed)
Complains of episode of vomiting on Thursday and Friday. He reports it being yellow in color-- no blood. He reports it was small in nature, and believes it is 2/2 ongoing chronic cough. Overall improved compared to last week. He is able to tolerate PO intake. He is taking his medications. No concern for aspiration.  ? ?CTM  ?Tessalon refilled for cough  ?

## 2021-09-28 NOTE — Assessment & Plan Note (Signed)
Reports being out of tessalon for chronic cough. He reports that it is very bothersome, and has been interfering with his QOL.  ? ?Tessalon refilled today  ?Follow up to Pulm appt on 10/29/21 ?

## 2021-09-28 NOTE — Patient Instructions (Signed)
Please continue taking your medications  ?Tessalon refilled ?Work note sent via [place]  ?Please continue going to HD-- do not miss any  ?Continue with fluid restriction -- restrict to 750 ml or less  ?

## 2021-09-29 DIAGNOSIS — Z992 Dependence on renal dialysis: Secondary | ICD-10-CM | POA: Diagnosis not present

## 2021-09-29 DIAGNOSIS — T8249XA Other complication of vascular dialysis catheter, initial encounter: Secondary | ICD-10-CM | POA: Diagnosis not present

## 2021-09-29 DIAGNOSIS — N2581 Secondary hyperparathyroidism of renal origin: Secondary | ICD-10-CM | POA: Diagnosis not present

## 2021-09-29 DIAGNOSIS — N186 End stage renal disease: Secondary | ICD-10-CM | POA: Diagnosis not present

## 2021-09-29 NOTE — Progress Notes (Signed)
New Patient Note  RE: Phillip Young MRN: [id number] DOB: 30-Apr-1986 Date of Office Visit: 09/30/2021  Consult requested by: [name], * Primary care provider: [name], MD  Chief Complaint: Cough (Chronic cough x 3 years and sometimes he vomits with it he has chf and kidney failure and high liver enzymes he is on dialysis)  History of Present Illness: I had the pleasure of seeing [name] for initial evaluation at the [facility] on 09/30/2021. He is a 36 y.o. male, who is referred here by [name], MD for the evaluation of cough.  He reports symptoms of chest tightness, shortness of breath, coughing with post tussive emesis, wheezing, nocturnal awakenings for 3 years.   Current medications include none. He tried the following inhalers: albuterol nebulizer in the hospital but it caused palpitations. Main triggers are exertion. In the last month, frequency of symptoms: daily. Frequency of nocturnal symptoms: patient can't sleep at night and wears a bipap x 1.5 year.   Frequency of SABA use: 0x/week. Interference with physical activity: yes. Sleep is disturbed. In the last 12 months, emergency room visits/urgent care visits/doctor office visits or hospitalizations due to respiratory issues: twice recently.   In the last 12 months, oral steroids courses: no. Lifetime history of hospitalization for respiratory issues: unsure but hospitalized recently for CHF. Prior intubations: no. History of pneumonia: yes. He was not evaluated by allergist/pulmonologist in the past. Smoking exposure: no. Up to date with flu vaccine: no. Up to date with COVID-19 vaccine: no. Prior Covid-19 infection: yes in January 2022. History of reflux: yes - not on medications right now.   Patient has been on dialysis since July 2022 - going 3 times per week and last dialysis was yesterday.  He follows with nephrologist.   He was diagnosed with CHF in  October 2019. He follows with cardiologist.   09/02/2021 CXR: "IMPRESSION: Cardiomegaly. Mild bilateral heterogeneous opacities, concerning for pulmonary edema."  Assessment and Plan: Timmey is a 36 y.o. male with: Chronic cough 36 year old AA male with a complicated medical history including ESRD on HD, CHF, Pafib, morbid obesity, OSA on BiPAP and prediabetic with today's complaint of coughing with post tussive emesis, shortness of breath, wheezing x 3 years. Used albuterol nebulizer while hospitalized which caused palpitations. Concerned if has asthma or allergies. These episodes seem to be worse when driving in his car. Covid-19 in January 2022.  Today's spirometry showed severe restriction with 20% improvement in FEV1 post bronchodilator treatment - only used xopenex 2 puffs due to his afib. Clinically slightly improved. Discussed with patient that his respiratory symptoms are most likely multifactorial in nature and worsened by his underlying medical history of CHF, afib and ESRD.  May use levoalbuterol rescue inhaler 2 puffs or nebulizer every 4 to 6 hours as needed for shortness of breath, chest tightness, coughing, and wheezing. Monitor frequency of use.  Spacer given and demonstrated proper use with inhaler. Patient understood technique and all questions/concerned were addressed.  Get bloodwork rule out other etiologies. Follow up with PCP, nephrologist and cardiologist as scheduled.  Pruritus Second complaint of pruritus without a rash.  See below for proper skincare. Get bloodwork to rule out other etiologies.   Recurrent infections Followed by ID due to recent bacteremia and osteomyelitis - thought to be due to infected port? Get bloodwork to look at immune system.   Return in about 4 weeks (around 10/28/2021).  Meds ordered this encounter  Medications   levalbuterol (XOPENEX HFA) 45 MCG/ACT inhaler    Sig: Inhale 1-2 puffs into the lungs every 4 (four) hours as needed for  wheezing or shortness of breath (coughing fits).    Dispense:  1 each    Refill:  1    Patient has paroxysmal atrial fibrillation.   Lab Orders         Alpha-1-antitrypsin         ANA w/Reflex         Allergens w/Total IgE Area 2         IgG, IgA, IgM         Complement, total         Diphtheria / Tetanus Antibody Panel         Strep pneumoniae 23 Serotypes IgG         Food Allergy Profile         Tryptase      Other allergy screening: Rhino conjunctivitis: no Food allergy: no Medication allergy: yes Hymenoptera allergy: no Urticaria: no Eczema:no History of recurrent infections suggestive of immunodeficency: no  Diagnostics: Spirometry:  Tracings reviewed. His effort: It was hard to get consistent efforts and there is a question as to whether this reflects a maximal maneuver. FVC: 1.57L FEV1: 1.50L, 38% predicted FEV1/FVC ratio: 96% Interpretation: Spirometry consistent with severe restrictive disease with 20% improvement in FEV1 post bronchodilator treatment. Clinically feeling slightly improved.   Please see scanned spirometry results for details.  Past Medical History: Patient Active Problem List   Diagnosis Date Noted   Recurrent infections 09/30/2021   Other fatigue 09/30/2021   Pruritus 09/30/2021   Vomiting 09/28/2021   Pruritic rash 09/17/2021   Reactive depression 09/02/2021   Hypoxemia 09/02/2021   Abscess of iliac fossa 08/26/2021   Staphylococcal arthritis ([place]) 08/26/2021   Healthcare maintenance 08/26/2021   Sacral osteomyelitis ([place]) 08/26/2021   Obesity 08/26/2021   Volume overload 08/18/2021   ESRD on dialysis [facility])    Palliative care by specialist    Full code status    Concern about end of life    Acute on chronic respiratory failure with hypoxia ([place]) 04/21/2021   Medical non-compliance 04/21/2021   Hypotension 04/21/2021   MSSA bacteremia    ESRD (end stage renal disease) on dialysis ([place]) 04/16/2021   ESRD (end stage renal disease)  ([place]) 04/12/2021   Paroxysmal atrial flutter ([place]) 04/12/2021   Acute respiratory failure with hypoxia ([name]) 04/12/2021   Hyponatremia 04/12/2021   Dyspnea    Cardiogenic shock ([name])    Atrial flutter with rapid ventricular response ([place]) 02/11/2021   Elevated troponin 02/11/2021   Chronic respiratory failure with hypoxia ([place]) 02/11/2021   CAP (community acquired pneumonia) 24/03/7352   Chronic systolic CHF (congestive heart failure) ([name])    Hypokalemia    CHF (congestive heart failure) ([name])    Chronic cough    Acute on chronic systolic (congestive) heart failure ([place]) 02/26/2020   Morbid obesity ([place]) 02/26/2020   GERD without esophagitis 02/26/2020   Diuretic-induced hypokalemia 02/26/2020   OSA (obstructive sleep apnea) 02/26/2020   Hidradenitis suppurativa 02/26/2020   Prediabetes 02/26/2020   Past Medical History:  Diagnosis Date   Biventricular congestive heart failure ([place])    Last Echo 11/2019 at [facility] reveals EF 20%   Class 3 severe obesity due to excess calories with serious comorbidity and body mass index (BMI) of 50.0 to 59.9 in adult [facility]) 02/26/2020   Essential hypertension 02/26/2020   GERD without  esophagitis 02/26/2020   Hidradenitis suppurativa 02/26/2020   OSA (obstructive sleep apnea) 02/26/2020   Prediabetes 02/26/2020   Renal disorder    Past Surgical History: Past Surgical History:  Procedure Laterality Date   ABSCESS DRAINAGE     AV FISTULA PLACEMENT Left 08/21/2021   Procedure: LEFT ARM ARTERIOVENOUS (AV) FISTULA.;  Surgeon: Serafina Mitchell, MD;  Location: [facility] OR;  Service: Vascular;  Laterality: Left;   IR FLUORO GUIDE CV LINE RIGHT  03/10/2021   IR FLUORO GUIDE CV LINE RIGHT  04/22/2021   IR FLUORO GUIDE CV LINE RIGHT  08/20/2021   IR US GUIDE VASC ACCESS RIGHT  03/10/2021   IR US GUIDE VASC ACCESS RIGHT  04/22/2021   RIGHT HEART CATH N/A 03/06/2021   Procedure: RIGHT HEART CATH;  Surgeon: Jolaine Artist, MD;  Location: [place] CV  LAB;  Service: Cardiovascular;  Laterality: N/A;   RIGHT/LEFT HEART CATH AND CORONARY ANGIOGRAPHY N/A 03/04/2020   Procedure: RIGHT/LEFT HEART CATH AND CORONARY ANGIOGRAPHY;  Surgeon: Larey Dresser, MD;  Location: [place] [facility] LAB;  Service: Cardiovascular;  Laterality: N/A;   TEE WITHOUT CARDIOVERSION N/A 05/05/2021   Procedure: TRANSESOPHAGEAL ECHOCARDIOGRAM (TEE);  Surgeon: Larey Dresser, MD;  Location: [facility] ENDOSCOPY;  Service: Cardiovascular;  Laterality: N/A;   TEMPORARY DIALYSIS CATHETER  03/06/2021   Procedure: TEMPORARY DIALYSIS CATHETER;  Surgeon: Jolaine Artist, MD;  Location: [place] [facility] LAB;  Service: Cardiovascular;;   Medication List:  Current Outpatient Medications  Medication Sig Dispense Refill   benzonatate (TESSALON) 200 MG capsule Take 1 capsule (200 mg total) by mouth 3 (three) times daily as needed for cough. 30 capsule 0   ELIQUIS 5 MG TABS tablet TAKE 1 TABLET(5 MG) BY MOUTH TWICE DAILY 180 tablet 3   gabapentin (NEURONTIN) 100 MG capsule Take 100 mg by mouth 3 (three) times daily.     lanthanum (FOSRENOL) 500 MG chewable tablet Chew 500 mg by mouth 3 (three) times daily with meals.     levalbuterol (XOPENEX HFA) 45 MCG/ACT inhaler Inhale 1-2 puffs into the lungs every 4 (four) hours as needed for wheezing or shortness of breath (coughing fits). 1 each 1   No current facility-administered medications for this visit.   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, SRA positive   Social History: Social History   Socioeconomic History   Marital status: Single    Spouse name: Not on file   Number of children: Not on file   Years of education: Not on file   Highest education level: Not on file  Occupational History   Not on file  Tobacco Use   Smoking status: Former    Packs/day: 1.00    Types: Cigarettes    Quit  date: 2019    Years since quitting: 4.1    Passive exposure: Current   Smokeless tobacco: Former   Tobacco comments:    quit in 2019  Substance and Sexual Activity   Alcohol use: Never   Drug use: Never   Sexual activity: Not on file  Other Topics Concern   Not on file  Social History Narrative   Not on file   Social Determinants of Health   Financial Resource Strain: Low Risk    Difficulty of Paying Living Expenses: Not very hard  Food Insecurity: Food Insecurity Present  Worried About Charity fundraiser in the Last Year: Never true   [place] in the Last Year: Sometimes true  Transportation Needs: No Transportation Needs   Lack of Transportation (Medical): No   Lack of Transportation (Non-Medical): No  Physical Activity: Not on file  Stress: Not on file  Social Connections: Not on file   Lives in a house. 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   Environmental History: Water Damage/mildew in the house: no Carpet in the family room: no Carpet in the bedroom: no Heating: electric Cooling: central Pet: yes 1 dog  Family History: Family History  Problem Relation Age of Onset   Heart disease Mother    Hypertension Mother    Pulmonary Hypertension Mother    Drug abuse Father        died due to Heroin overdose   Problem                               Relation Asthma                                   no Eczema                                no Food allergy                          no Allergic rhino conjunctivitis     no  Review of Systems  Constitutional:  Positive for fatigue. Negative for appetite change, chills, fever and unexpected weight change.  HENT:  Negative for congestion and rhinorrhea.   Eyes:  Negative for itching.  Respiratory:  Positive for cough, chest tightness, shortness of breath and wheezing.   Cardiovascular:  Positive for palpitations and leg swelling. Negative for chest pain.  Gastrointestinal:   Negative for abdominal pain.  Genitourinary:  Positive for difficulty urinating.  Skin:  Negative for rash.  Neurological:  Negative for headaches.   Objective: BP 130/80    Pulse (!) 110    Temp 98.6 F (37 C) (Temporal)    Resp 20    Ht 6' (1.829 m)    Wt (!) 411 lb (186.4 kg)    SpO2 92%    BMI 55.74 kg/m  Body mass index is 55.74 kg/m. Physical Exam Vitals and nursing note reviewed.  Constitutional:      Appearance: Normal appearance. He is well-developed. He is obese.  HENT:     Head: Normocephalic and atraumatic.     Right Ear: Tympanic membrane and external ear normal.     Left Ear: Tympanic membrane and external ear normal.     Nose: Nose normal.     Mouth/Throat:     Mouth: Mucous membranes are moist.     Pharynx: Oropharynx is clear.  Eyes:     Conjunctiva/sclera: Conjunctivae normal.  Cardiovascular:     Rate and Rhythm: Normal rate and regular rhythm.     Heart sounds: Normal heart sounds. No murmur heard.   No friction rub. No gallop.  Pulmonary:     Effort: Pulmonary effort is normal.     Breath sounds: Normal breath sounds. No wheezing, rhonchi or rales.  Musculoskeletal:     Cervical back: Neck supple.  Skin:    General: Skin is warm.     Findings: No rash.  Neurological:     Mental Status: He is alert and oriented to person, place, and time.   The plan was reviewed with the patient/family, and all questions/concerned were addressed.  It was my pleasure to see Phillip Young today and participate in his care. Please feel free to contact me with any questions or concerns.  Sincerely,  Rexene Alberts, DO Allergy & Immunology  [facility] office: [place] office: [phone]

## 2021-09-30 ENCOUNTER — Encounter: Payer: Self-pay | Admitting: Allergy

## 2021-09-30 ENCOUNTER — Other Ambulatory Visit: Payer: Self-pay

## 2021-09-30 ENCOUNTER — Ambulatory Visit (INDEPENDENT_AMBULATORY_CARE_PROVIDER_SITE_OTHER): Payer: BC Managed Care – PPO | Admitting: Allergy

## 2021-09-30 VITALS — BP 130/80 | HR 110 | Temp 98.6°F | Resp 20 | Ht 72.0 in | Wt >= 6400 oz

## 2021-09-30 DIAGNOSIS — L299 Pruritus, unspecified: Secondary | ICD-10-CM | POA: Insufficient documentation

## 2021-09-30 DIAGNOSIS — B999 Unspecified infectious disease: Secondary | ICD-10-CM | POA: Insufficient documentation

## 2021-09-30 DIAGNOSIS — R5383 Other fatigue: Secondary | ICD-10-CM | POA: Diagnosis not present

## 2021-09-30 DIAGNOSIS — R053 Chronic cough: Secondary | ICD-10-CM

## 2021-09-30 DIAGNOSIS — R0602 Shortness of breath: Secondary | ICD-10-CM

## 2021-09-30 MED ORDER — LEVALBUTEROL TARTRATE 45 MCG/ACT IN AERO
1.0000 | INHALATION_SPRAY | RESPIRATORY_TRACT | 1 refills | Status: DC | PRN
Start: 2021-09-30 — End: 2021-12-16

## 2021-09-30 NOTE — Assessment & Plan Note (Signed)
36 year old AA male with a complicated medical history including ESRD on HD, CHF, Pafib, morbid obesity, OSA on BiPAP and prediabetic with today's complaint of coughing with post tussive emesis, shortness of breath, wheezing x 3 years. Used albuterol nebulizer while hospitalized which caused palpitations. Concerned if has asthma or allergies. These episodes seem to be worse when driving in his car. Covid-19 in January 2022.  ?? Today's spirometry showed severe restriction with 20% improvement in FEV1 post bronchodilator treatment - only used xopenex 2 puffs due to his afib. Clinically slightly improved. ?? Discussed with patient that his respiratory symptoms are most likely multifactorial in nature and worsened by his underlying medical history of CHF, afib and ESRD.  ?? May use levoalbuterol rescue inhaler 2 puffs or nebulizer every 4 to 6 hours as needed for shortness of breath, chest tightness, coughing, and wheezing. Monitor frequency of use.  ?? Spacer given and demonstrated proper use with inhaler. Patient understood technique and all questions/concerned were addressed.  ?? Get bloodwork rule out other etiologies. ?? Follow up with PCP, nephrologist and cardiologist as scheduled. ?

## 2021-09-30 NOTE — Patient Instructions (Addendum)
Please follow up with your PCP, nephrologist and cardiologist. ? ?Coughing/shortness of breath ?I think your symptoms are caused by multiple things including your congestive heart failure, atrial fibrillation and end stage renal disease. ? ?May use levoalbuterol rescue inhaler 2 puffs or nebulizer every 4 to 6 hours as needed for shortness of breath, chest tightness, coughing, and wheezing. Monitor frequency of use.  ?Spacer given and demonstrated proper use with inhaler. Patient understood technique and all questions/concerned were addressed.  ? ?Get bloodwork ?We are ordering labs, so please allow 1-2 weeks for the results to come back. ?With the newly implemented Cures Act, the labs might be visible to you at the same time that they become visible to me. However, I will not address the results until all of the results are back, so please be patient.  ?In the meantime, continue recommendations in your patient instructions, including avoidance measures (if applicable), until you hear from me. ? ?Itching: ?See below for proper skincare. ? ?Follow up in 4 weeks or sooner if needed.  ? ?Skin care recommendations ? ?Bath time: ?Always use lukewarm water. AVOID very hot or cold water. ?Keep bathing time to 5-10 minutes. ?Do NOT use bubble bath. ?Use a mild soap and use just enough to wash the dirty areas. ?Do NOT scrub skin vigorously.  ?After bathing, pat dry your skin with a towel. Do NOT rub or scrub the skin. ? ?Moisturizers and prescriptions:  ?ALWAYS apply moisturizers immediately after bathing (within 3 minutes). This helps to lock-in moisture. ?Use the moisturizer several times a day over the whole body. ?Good summer moisturizers include: Aveeno, CeraVe, Cetaphil. ?Good winter moisturizers include: Aquaphor, Vaseline, Cerave, Cetaphil, Eucerin, Vanicream. ?When using moisturizers along with medications, the moisturizer should be applied about one hour after applying the medication to prevent diluting effect of  the medication or moisturize around where you applied the medications. When not using medications, the moisturizer can be continued twice daily as maintenance. ? ?Laundry and clothing: ?Avoid laundry products with added color or perfumes. ?Use unscented hypo-allergenic laundry products such as Tide free, Cheer free & gentle, and All free and clear.  ?If the skin still seems dry or sensitive, you can try double-rinsing the clothes. ?Avoid tight or scratchy clothing such as wool. ?Do not use fabric softeners or dyer sheets. + ?

## 2021-09-30 NOTE — Assessment & Plan Note (Signed)
Second complaint of pruritus without a rash.  ?? See below for proper skincare. ?? Get bloodwork to rule out other etiologies.  ?

## 2021-09-30 NOTE — Assessment & Plan Note (Signed)
Followed by ID due to recent bacteremia and osteomyelitis - thought to be due to infected port? ?? Get bloodwork to look at immune system.  ?

## 2021-10-01 ENCOUNTER — Encounter (HOSPITAL_COMMUNITY): Payer: Self-pay | Admitting: Cardiology

## 2021-10-01 DIAGNOSIS — T8249XA Other complication of vascular dialysis catheter, initial encounter: Secondary | ICD-10-CM | POA: Diagnosis not present

## 2021-10-01 DIAGNOSIS — N186 End stage renal disease: Secondary | ICD-10-CM | POA: Diagnosis not present

## 2021-10-01 DIAGNOSIS — Z992 Dependence on renal dialysis: Secondary | ICD-10-CM | POA: Diagnosis not present

## 2021-10-01 DIAGNOSIS — N2581 Secondary hyperparathyroidism of renal origin: Secondary | ICD-10-CM | POA: Diagnosis not present

## 2021-10-03 DIAGNOSIS — Z992 Dependence on renal dialysis: Secondary | ICD-10-CM | POA: Diagnosis not present

## 2021-10-03 DIAGNOSIS — N186 End stage renal disease: Secondary | ICD-10-CM | POA: Diagnosis not present

## 2021-10-03 DIAGNOSIS — N2581 Secondary hyperparathyroidism of renal origin: Secondary | ICD-10-CM | POA: Diagnosis not present

## 2021-10-03 DIAGNOSIS — T8249XA Other complication of vascular dialysis catheter, initial encounter: Secondary | ICD-10-CM | POA: Diagnosis not present

## 2021-10-05 ENCOUNTER — Other Ambulatory Visit: Payer: Self-pay

## 2021-10-05 ENCOUNTER — Encounter: Payer: Self-pay | Admitting: Surgery

## 2021-10-05 ENCOUNTER — Ambulatory Visit (HOSPITAL_COMMUNITY)
Admission: RE | Admit: 2021-10-05 | Discharge: 2021-10-05 | Disposition: A | Discharge status: Home / Self Care | Payer: BC Managed Care – PPO | Source: Ambulatory Visit | Attending: Surgery | Admitting: Surgery

## 2021-10-05 ENCOUNTER — Encounter (HOSPITAL_COMMUNITY): Payer: BC Managed Care – PPO

## 2021-10-05 ENCOUNTER — Ambulatory Visit (INDEPENDENT_AMBULATORY_CARE_PROVIDER_SITE_OTHER): Payer: BC Managed Care – PPO | Admitting: Surgery

## 2021-10-05 VITALS — BP 105/63 | HR 104 | Temp 97.8°F | Resp 20 | Ht 72.0 in | Wt >= 6400 oz

## 2021-10-05 DIAGNOSIS — J9610 Chronic respiratory failure, unspecified whether with hypoxia or hypercapnia: Secondary | ICD-10-CM | POA: Diagnosis not present

## 2021-10-05 DIAGNOSIS — N186 End stage renal disease: Secondary | ICD-10-CM | POA: Insufficient documentation

## 2021-10-05 DIAGNOSIS — I509 Heart failure, unspecified: Secondary | ICD-10-CM | POA: Diagnosis not present

## 2021-10-05 DIAGNOSIS — I5023 Acute on chronic systolic (congestive) heart failure: Secondary | ICD-10-CM | POA: Diagnosis not present

## 2021-10-05 DIAGNOSIS — E662 Morbid (severe) obesity with alveolar hypoventilation: Secondary | ICD-10-CM | POA: Diagnosis not present

## 2021-10-05 DIAGNOSIS — Z992 Dependence on renal dialysis: Secondary | ICD-10-CM

## 2021-10-05 NOTE — Progress Notes (Incomplete)
PCP: [name], MD Nephrology: Dr. Joelyn Oms HF Cardiology: Dr. [name]  HPI: 36 y.o. with history of cardiomyopathy and obesity.  He has been known to have a cardiomyopathy (biventricular failure) since back in 2019, echo in 2019 with EF 20% [facility]).  He has been followed in the [facility] system.  Admitted to [facility] in 7/65 with A/C Systolic HF. Diuresed with IV lasix and transitioned to torsemide. Placed on milrinone with low output and had RHC/LHC. Cath showed no CAD and volume overload.  Blood gas revealed a CO2 level elevated to 78. CPAP was initiated.   He was unable to tolerate Coreg due to wheezing and diarrhea.   Echo in 11/21 showed EF 20-25%, RV poorly visualized.  He was seen by EP, thought to be too large for cardiac MRI.   Patient was admitted in 7/22 with cardiogenic shock and AKI.  He ended up on CVVH.  He had a prolonged hospitalization for volume removal and titration off inotropes/pressors.  Eventually he was able to transition to [facility] and was discharged. Hospitalization was complicated by AF with RVR as well as HIT.  He was cardioverted several times.  Echo in 7/22 showed EF < 20%, severe LV enlargement, normal RV.   Admitted 9/22 with MSSA bacteremia due to infected HD catheter. TEE showed EF 20%, severely decreased LV with global HK, moderately reduced RV, no vegetation or thrombus noted. Hospitalization complicated by septic arthritis and muscle abscess. IV abx per ID.  Admitted 1/23 at [facility] with a/c CHF.  Re-admitted shortly thereafter after presenting to HD tachycardic and tachypneic. HD staff unable to access his dialysis catheter. He underwent a tunnel catheter exchange and taken for AV fistula creation.   Admitted 2/23 for acute on chronic hypoxic respiratory failure and a/c CHF in setting of incomplete HD sessions and dietary indiscretion/noncompliance with fluid restriction. He received serial HD sessions and was seen by PMT for [place], he  remained full code.   Today he returns for post hospital HF follow up. Overall feeling fine. He has occasional dizziness. Able to get around and complete job responsibilities without significant dyspnea. Denies palpitations, CP, edema, or PND/Orthopnea. Appetite ok. No fever or chills. Weight is stable at HD. Taking all medications. He wears CPAP at home.HD T/TH/Sat. Drinking "sips" tequila daily. Drives a bus for the city.  Labs (10/21): K 3.7, creatinine 1.07 Labs (2/23): K 4.3, creatinine 6.42  ECG (personally reviewed): initially appearing ST, but after review appears AFL, QTC 570 msec  PMH: 1. Hidradenitis suppuritiva 2. GERD 3. OHS/OSA  4. Diabetes 5. Morbid obesity 6. Fe deficiency anemia 7. Chronic systolic CHF: Nonischemic cardiomyopathy.  No history of ETOH or drugs.  +ANA.  No strong FH of cardiomyopathy.  - Echo (10/19, [facility]): EF 20%.  - Echo (5/21, [facility]): EF 20% - Echo (8/21): EF < 20%, moderate LV dilation, severely decreased RV function with severe RV dilation, severe biatrial enlargement, mild MR.  - LHC/RHC (8/21): No significant coronary disease; mean RA 19, PA 71/20 mean 39, mean PCWP 23, CI 2.1, PVR 2.5 WU.  - Echo (11/21): EF 20-25%, RV poorly visualized.  - Echo (7/22): EF < 20%, severely dilated LV.  8. Prior smoker.  9. ESRD 10. Atrial fibrillation: Paroxysmal.  11. History of HIT.   ROS: All systems negative except as listed in HPI, PMH and Problem List.  Social History   Socioeconomic History   Marital status: Single    Spouse name: Not  on file   Number of children: Not on file   Years of education: Not on file   Highest education level: Not on file  Occupational History   Not on file  Tobacco Use   Smoking status: Former    Packs/day: 1.00    Types: Cigarettes    Quit date: 2019    Years since quitting: 4.1    Passive exposure: Current   Smokeless tobacco: Former   Tobacco comments:    quit in 2019  Substance and Sexual  Activity   Alcohol use: Never   Drug use: Never   Sexual activity: Not on file  Other Topics Concern   Not on file  Social History Narrative   Not on file   Social Determinants of Health   Financial Resource Strain: Low Risk    Difficulty of Paying Living Expenses: Not very hard  Food Insecurity: Food Insecurity Present   Worried About [place] in the Last Year: Never true   Ran Out of Food in the Last Year: Sometimes true  Transportation Needs: No Transportation Needs   Lack of Transportation (Medical): No   Lack of Transportation (Non-Medical): No  Physical Activity: Not on file  Stress: Not on file  Social Connections: Not on file  Intimate Partner Violence: Not on file   Family History  Problem Relation Age of Onset   Heart disease Mother    Hypertension Mother    Pulmonary Hypertension Mother    Drug abuse Father        died due to Heroin overdose   Past Medical History:  Diagnosis Date   Biventricular congestive heart failure ([place])    Last Echo 11/2019 at [facility] reveals EF 20%   Class 3 severe obesity due to excess calories with serious comorbidity and body mass index (BMI) of 50.0 to 59.9 in adult [facility]) 02/26/2020   Essential hypertension 02/26/2020   GERD without esophagitis 02/26/2020   Hidradenitis suppurativa 02/26/2020   OSA (obstructive sleep apnea) 02/26/2020   Prediabetes 02/26/2020   Renal disorder    Current Outpatient Medications  Medication Sig Dispense Refill   benzonatate (TESSALON) 200 MG capsule Take 1 capsule (200 mg total) by mouth 3 (three) times daily as needed for cough. 30 capsule 0   ELIQUIS 5 MG TABS tablet TAKE 1 TABLET(5 MG) BY MOUTH TWICE DAILY 180 tablet 3   gabapentin (NEURONTIN) 100 MG capsule Take 100 mg by mouth 3 (three) times daily.     lanthanum (FOSRENOL) 500 MG chewable tablet Chew 500 mg by mouth 3 (three) times daily with meals.     levalbuterol (XOPENEX HFA) 45 MCG/ACT inhaler Inhale 1-2 puffs into the  lungs every 4 (four) hours as needed for wheezing or shortness of breath (coughing fits). 1 each 1   No current facility-administered medications for this visit.   There were no vitals taken for this visit.  Wt Readings from Last 3 Encounters:  09/30/21 (!) 186.4 kg (411 lb)  09/21/21 (!) 189.6 kg (418 lb)  09/15/21 (!) 186.2 kg (410 lb 9.6 oz)   PHYSICAL EXAM: General:  NAD. No resp difficulty HEENT: Normal Neck: Supple. Thick neck. Carotids 2+ bilat; no bruits. No lymphadenopathy or thryomegaly appreciated. Cor: PMI nondisplaced. Regular rate & rhythm. No rubs, gallops or murmurs. Lungs: Clear; right HD catheter looks ok Abdomen: Obese, nontender, nondistended. No hepatosplenomegaly. No bruits or masses. Good bowel sounds. Extremities: No cyanosis, clubbing, rash, edema Neuro: Alert & oriented x 3,  cranial nerves grossly intact. Moves all 4 extremities w/o difficulty. Affect pleasant.  ASSESSMENT & PLAN: 1. Chronic Biventricular Heart Failure:  Known NICM since 2019, previously followed by [facility].  Echo 8/21 EF < 20%, moderate LV dilation, severely decreased RV function, severe biatrial enlargement, mild MR. No history of ETOH/drugs.  No FH of cardiomyopathy (mother with [place]).  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  He is too large for cardiac MRI. RHC/LHC on 03/04/20 with no CAD low CI at 2.1. Evaluated by EP but not a candidate for ICD given BMI.  Echo in 7/22 with EF < 20%, RV poorly visualized.  Developed cardiogenic shock 7/22 admission requiring pressors/inotropes and had AKI progressing to ESRD.  He titrated off inotropes/pressors.  Now off midodrine and stable BP.   - Continue HD for volume control, he is tolerating.   - He has been referred for cardiac rehab.   2. Atrial fibrillation: Paroxysmal.  DCCV several times in 8/22 admission.    - He was taken off amiodarone recently due to elevated LFTs. LFTs normalized. Looks like  he was in AFL on ECG 09/02/21. - Restart amiodarone 200 mg bid. Will re-check ECG in 2 weeks, if remains in AFL will arrange for DCCV with Dr. [name]. - Continue Eliquis. No bleeding issues. 3. ESRD: So far, tolerating HD and getting fluid off effectively.    - LUE fistula failed to mature. 4. OHS/OSA: Using 2 L home oxygen.  He wears CPAP nightly. 5. Super Morbid Obesity: There is no height or weight on file to calculate BMI. - Refer to pharmacy clinic for semaglutide.   ECG in 2 weeks, if remains in AFL arrange for DCCV with Dr. [name].  Followup in 2 months with Dr. [name]. He was given a note today for his employer.   [place] FNP 10/05/2021  Addendum: Discussed with Dr. [name], will not initiate amiodarone and instead arrange for TEE/DCCV (has not been compliant with Eliquis), then refer to EP for possible ablation.

## 2021-10-05 NOTE — Progress Notes (Signed)
? ?  Patient name: Phillip Young MRN: [id number] DOB: 08-Jul-1986 Sex: male ? ?REASON FOR VISIT:  ? ? ? Post op ? ?HISTORY OF PRESENT ILLNESS:  ? ?Phillip Young is a 36 y.o. male who is status post left brachiocephalic fistula on 09/06/9415.  He is back for follow-up. ? ?CURRENT MEDICATIONS:  ? ? ?Current Outpatient Medications  ?Medication Sig Dispense Refill  ? benzonatate (TESSALON) 200 MG capsule Take 1 capsule (200 mg total) by mouth 3 (three) times daily as needed for cough. 30 capsule 0  ? ELIQUIS 5 MG TABS tablet TAKE 1 TABLET(5 MG) BY MOUTH TWICE DAILY 180 tablet 3  ? gabapentin (NEURONTIN) 100 MG capsule Take 100 mg by mouth 3 (three) times daily.    ? lanthanum (FOSRENOL) 500 MG chewable tablet Chew 500 mg by mouth 3 (three) times daily with meals.    ? levalbuterol (XOPENEX HFA) 45 MCG/ACT inhaler Inhale 1-2 puffs into the lungs every 4 (four) hours as needed for wheezing or shortness of breath (coughing fits). 1 each 1  ? ?No current facility-administered medications for this visit.  ? ? ?REVIEW OF SYSTEMS:  ? ?[X]  denotes positive finding, [ ]  denotes negative finding ?Cardiac  Comments:  ?Chest pain or chest pressure:    ?Shortness of breath upon exertion:    ?Short of breath when lying flat:    ?Irregular heart rhythm:    ?Constitutional    ?Fever or chills:    ? ? ?PHYSICAL EXAM:  ? ?Vitals:  ? 10/05/21 1118  ?BP: 105/63  ?Pulse: (!) 104  ?Resp: 20  ?Temp: 97.8 ?F (36.6 ?C)  ?SpO2: 99%  ?Weight: (!) 411 lb (186.4 kg)  ?Height: 6' (1.829 m)  ? ? ?GENERAL: The patient is a well-nourished male, in no acute distress. The vital signs are documented above. ?CARDIOVASCULAR: There is a regular rate and rhythm. ?PULMONARY: Non-labored respirations ?No thrill in fistula.  I evaluated the arm and did not see great surface veins, however this was examined in awkward angle as the patient was sitting in a wheelchair with high flow oxygen ? ?STUDIES:  ? ?Occluded left brachiocephalic  fistula ? ? ?MEDICAL ISSUES:  ? ?I discussed with the patient that we need to proceed with new access.  He is left-handed.  I will plan on either a first stage basilic vein fistula or a upper arm graft.  He does not want a graft because of how he thinks it will look.  I would restrict the graft only to his upper arm.  Hopefully when he is laid on the table and I can visualized his arm better with ultrasound, he will have an adequate basilic vein as he did on his previous vein mapping.  He will need to be off of his Eliquis prior to the procedure.  If I do proceed with basilic vein fistula, it would likely be staged.  He understands there is a need for another operation if so. ? ?Annamarie Major, IV, MD, FACS ?[facility] ?Tel [phone] ?Pager [phone] ? ? ?  ?

## 2021-10-06 ENCOUNTER — Telehealth (HOSPITAL_COMMUNITY): Payer: Self-pay

## 2021-10-06 DIAGNOSIS — T8249XA Other complication of vascular dialysis catheter, initial encounter: Secondary | ICD-10-CM | POA: Diagnosis not present

## 2021-10-06 DIAGNOSIS — Z992 Dependence on renal dialysis: Secondary | ICD-10-CM | POA: Diagnosis not present

## 2021-10-06 DIAGNOSIS — N186 End stage renal disease: Secondary | ICD-10-CM | POA: Diagnosis not present

## 2021-10-06 DIAGNOSIS — N2581 Secondary hyperparathyroidism of renal origin: Secondary | ICD-10-CM | POA: Diagnosis not present

## 2021-10-06 NOTE — Telephone Encounter (Signed)
Called and was unable to leave a voice message to confirm/remind patient of their appointment at the [facility] on 10/07/21.  ? ?

## 2021-10-07 ENCOUNTER — Inpatient Hospital Stay (HOSPITAL_COMMUNITY)
Admission: RE | Admit: 2021-10-07 | Discharge: 2021-10-07 | Disposition: A | Discharge status: Home / Self Care | Payer: BC Managed Care – PPO | Source: Ambulatory Visit

## 2021-10-07 DIAGNOSIS — N2581 Secondary hyperparathyroidism of renal origin: Secondary | ICD-10-CM | POA: Diagnosis not present

## 2021-10-07 DIAGNOSIS — T8249XA Other complication of vascular dialysis catheter, initial encounter: Secondary | ICD-10-CM | POA: Diagnosis not present

## 2021-10-07 DIAGNOSIS — Z992 Dependence on renal dialysis: Secondary | ICD-10-CM | POA: Diagnosis not present

## 2021-10-07 DIAGNOSIS — N186 End stage renal disease: Secondary | ICD-10-CM | POA: Diagnosis not present

## 2021-10-08 ENCOUNTER — Telehealth: Payer: Self-pay

## 2021-10-08 ENCOUNTER — Telehealth: Payer: Self-pay | Admitting: *Deleted

## 2021-10-08 DIAGNOSIS — T8249XA Other complication of vascular dialysis catheter, initial encounter: Secondary | ICD-10-CM | POA: Diagnosis not present

## 2021-10-08 DIAGNOSIS — N186 End stage renal disease: Secondary | ICD-10-CM | POA: Diagnosis not present

## 2021-10-08 DIAGNOSIS — N2581 Secondary hyperparathyroidism of renal origin: Secondary | ICD-10-CM | POA: Diagnosis not present

## 2021-10-08 DIAGNOSIS — Z992 Dependence on renal dialysis: Secondary | ICD-10-CM | POA: Diagnosis not present

## 2021-10-08 NOTE — Telephone Encounter (Signed)
Contacted patient to schedule him for his left first stage BVT vs AVGG and he informed me that he's having a TEE/cardioversion on tomorrow. I advised him that we would have to schedule his surgery at least 4 weeks after this procedure, so he could hold his Eliquis. He then declined to schedule, stating he will just keep his dialysis catheter and have it changed it out or call office if he changes his mind. Provider made aware.    ?

## 2021-10-09 ENCOUNTER — Encounter (HOSPITAL_COMMUNITY): Payer: Self-pay | Admitting: Cardiology

## 2021-10-09 ENCOUNTER — Other Ambulatory Visit: Payer: Self-pay

## 2021-10-09 ENCOUNTER — Ambulatory Visit (HOSPITAL_COMMUNITY)
Admission: RE | Admit: 2021-10-09 | Discharge: 2021-10-09 | Disposition: A | Discharge status: Home / Self Care | Payer: BC Managed Care – PPO | Source: Ambulatory Visit | Attending: Cardiology | Admitting: Cardiology

## 2021-10-09 ENCOUNTER — Encounter (HOSPITAL_COMMUNITY)
Admission: RE | Disposition: A | Discharge status: Home / Self Care | Payer: Self-pay | Source: Home / Self Care | Attending: Cardiology

## 2021-10-09 ENCOUNTER — Ambulatory Visit (HOSPITAL_COMMUNITY): Payer: BC Managed Care – PPO | Admitting: Certified Registered Nurse Anesthetist

## 2021-10-09 ENCOUNTER — Inpatient Hospital Stay (HOSPITAL_COMMUNITY)
Admission: RE | Admit: 2021-10-09 | Discharge: 2021-10-09 | DRG: 189 | Disposition: A | Discharge status: Home / Self Care | Payer: BC Managed Care – PPO | Attending: Cardiology | Admitting: Cardiology

## 2021-10-09 DIAGNOSIS — Z8249 Family history of ischemic heart disease and other diseases of the circulatory system: Secondary | ICD-10-CM

## 2021-10-09 DIAGNOSIS — Z9981 Dependence on supplemental oxygen: Secondary | ICD-10-CM | POA: Diagnosis not present

## 2021-10-09 DIAGNOSIS — Z79899 Other long term (current) drug therapy: Secondary | ICD-10-CM

## 2021-10-09 DIAGNOSIS — I5082 Biventricular heart failure: Secondary | ICD-10-CM | POA: Diagnosis not present

## 2021-10-09 DIAGNOSIS — I4892 Unspecified atrial flutter: Secondary | ICD-10-CM | POA: Diagnosis present

## 2021-10-09 DIAGNOSIS — G4733 Obstructive sleep apnea (adult) (pediatric): Secondary | ICD-10-CM | POA: Diagnosis not present

## 2021-10-09 DIAGNOSIS — I509 Heart failure, unspecified: Secondary | ICD-10-CM | POA: Diagnosis not present

## 2021-10-09 DIAGNOSIS — J9621 Acute and chronic respiratory failure with hypoxia: Principal | ICD-10-CM | POA: Diagnosis present

## 2021-10-09 DIAGNOSIS — N186 End stage renal disease: Secondary | ICD-10-CM | POA: Diagnosis present

## 2021-10-09 DIAGNOSIS — I132 Hypertensive heart and chronic kidney disease with heart failure and with stage 5 chronic kidney disease, or end stage renal disease: Secondary | ICD-10-CM | POA: Diagnosis not present

## 2021-10-09 DIAGNOSIS — I4891 Unspecified atrial fibrillation: Secondary | ICD-10-CM | POA: Diagnosis not present

## 2021-10-09 DIAGNOSIS — J9600 Acute respiratory failure, unspecified whether with hypoxia or hypercapnia: Secondary | ICD-10-CM | POA: Diagnosis present

## 2021-10-09 DIAGNOSIS — Z5309 Procedure and treatment not carried out because of other contraindication: Secondary | ICD-10-CM | POA: Diagnosis present

## 2021-10-09 DIAGNOSIS — I959 Hypotension, unspecified: Secondary | ICD-10-CM | POA: Diagnosis present

## 2021-10-09 DIAGNOSIS — E875 Hyperkalemia: Secondary | ICD-10-CM

## 2021-10-09 DIAGNOSIS — J9601 Acute respiratory failure with hypoxia: Secondary | ICD-10-CM | POA: Diagnosis not present

## 2021-10-09 DIAGNOSIS — Z7901 Long term (current) use of anticoagulants: Secondary | ICD-10-CM | POA: Diagnosis not present

## 2021-10-09 DIAGNOSIS — J9602 Acute respiratory failure with hypercapnia: Secondary | ICD-10-CM

## 2021-10-09 DIAGNOSIS — Z87891 Personal history of nicotine dependence: Secondary | ICD-10-CM | POA: Diagnosis not present

## 2021-10-09 DIAGNOSIS — Z6841 Body Mass Index (BMI) 40.0 and over, adult: Secondary | ICD-10-CM

## 2021-10-09 DIAGNOSIS — I5022 Chronic systolic (congestive) heart failure: Secondary | ICD-10-CM | POA: Diagnosis present

## 2021-10-09 DIAGNOSIS — E662 Morbid (severe) obesity with alveolar hypoventilation: Secondary | ICD-10-CM | POA: Diagnosis not present

## 2021-10-09 DIAGNOSIS — I480 Paroxysmal atrial fibrillation: Secondary | ICD-10-CM

## 2021-10-09 DIAGNOSIS — J9622 Acute and chronic respiratory failure with hypercapnia: Secondary | ICD-10-CM | POA: Diagnosis not present

## 2021-10-09 DIAGNOSIS — R053 Chronic cough: Secondary | ICD-10-CM | POA: Diagnosis present

## 2021-10-09 DIAGNOSIS — Z888 Allergy status to other drugs, medicaments and biological substances status: Secondary | ICD-10-CM

## 2021-10-09 HISTORY — PX: CARDIOVERSION: SHX1299

## 2021-10-09 LAB — BASIC METABOLIC PANEL
Anion gap: 14 (ref 5–15)
BUN: 32 mg/dL — ABNORMAL HIGH (ref 6–20)
CO2: 22 mmol/L (ref 22–32)
Calcium: 9.4 mg/dL (ref 8.9–10.3)
Chloride: 98 mmol/L (ref 98–111)
Creatinine, Ser: 7.44 mg/dL — ABNORMAL HIGH (ref 0.61–1.24)
GFR, Estimated: 9 mL/min — ABNORMAL LOW (ref >60)
Glucose, Bld: 95 mg/dL (ref 70–99)
Potassium: 5.9 mmol/L — ABNORMAL HIGH (ref 3.5–5.1)
Sodium: 134 mmol/L — ABNORMAL LOW (ref 135–145)

## 2021-10-09 SURGERY — CARDIOVERSION
Anesthesia: General

## 2021-10-09 MED ORDER — ONDANSETRON HCL 4 MG/2ML IJ SOLN
INTRAMUSCULAR | Status: DC | PRN
  Administered 2021-10-09: 4 mg via INTRAVENOUS

## 2021-10-09 MED ORDER — FENTANYL CITRATE (PF) 250 MCG/5ML IJ SOLN
INTRAMUSCULAR | Status: DC | PRN
  Administered 2021-10-09: 100 ug via INTRAVENOUS

## 2021-10-09 MED ORDER — ALBUTEROL SULFATE HFA 108 (90 BASE) MCG/ACT IN AERS
INHALATION_SPRAY | RESPIRATORY_TRACT | Status: DC | PRN
  Administered 2021-10-09: 4 via RESPIRATORY_TRACT

## 2021-10-09 MED ORDER — SODIUM CHLORIDE 0.9 % IV SOLN: INTRAVENOUS | Status: DC

## 2021-10-09 MED ORDER — MIDAZOLAM HCL 2 MG/2ML IJ SOLN
INTRAMUSCULAR | Status: AC
  Filled 2021-10-09: qty 2

## 2021-10-09 MED ORDER — AMISULPRIDE (ANTIEMETIC) 5 MG/2ML IV SOLN
INTRAVENOUS | Status: AC
  Filled 2021-10-09: qty 2

## 2021-10-09 MED ORDER — SODIUM ZIRCONIUM CYCLOSILICATE 5 G PO PACK
5.0000 g | PACK | Freq: Once | ORAL | Status: AC
  Administered 2021-10-09: 5 g via ORAL
  Filled 2021-10-09 (×2): qty 1

## 2021-10-09 MED ORDER — PROPOFOL 10 MG/ML IV BOLUS
INTRAVENOUS | Status: DC | PRN
  Administered 2021-10-09: 200 mg via INTRAVENOUS

## 2021-10-09 MED ORDER — VASOPRESSIN 20 UNIT/ML IV SOLN
INTRAVENOUS | Status: DC | PRN
  Administered 2021-10-09: 1 [IU] via INTRAVENOUS
  Administered 2021-10-09: 2 [IU] via INTRAVENOUS

## 2021-10-09 MED ORDER — SUGAMMADEX SODIUM 200 MG/2ML IV SOLN
INTRAVENOUS | Status: DC | PRN
  Administered 2021-10-09: 800 mg via INTRAVENOUS
  Administered 2021-10-09: 100 mg via INTRAVENOUS

## 2021-10-09 MED ORDER — MIDAZOLAM HCL 2 MG/2ML IJ SOLN
INTRAMUSCULAR | Status: DC | PRN
  Administered 2021-10-09: 1 mg via INTRAVENOUS

## 2021-10-09 MED ORDER — LIDOCAINE 2% (20 MG/ML) 5 ML SYRINGE
INTRAMUSCULAR | Status: DC | PRN
  Administered 2021-10-09: 40 mg via INTRAVENOUS

## 2021-10-09 MED ORDER — FENTANYL CITRATE (PF) 250 MCG/5ML IJ SOLN
INTRAMUSCULAR | Status: AC
  Filled 2021-10-09: qty 5

## 2021-10-09 MED ORDER — ROCURONIUM BROMIDE 10 MG/ML (PF) SYRINGE
PREFILLED_SYRINGE | INTRAVENOUS | Status: DC | PRN
  Administered 2021-10-09: 90 mg via INTRAVENOUS

## 2021-10-09 MED ORDER — AMISULPRIDE (ANTIEMETIC) 5 MG/2ML IV SOLN
5.0000 mg | Freq: Once | INTRAVENOUS | Status: AC
  Administered 2021-10-09: 5 mg via INTRAVENOUS

## 2021-10-09 MED ORDER — PHENYLEPHRINE HCL-NACL 20-0.9 MG/250ML-% IV SOLN
INTRAVENOUS | Status: DC | PRN
Start: 2021-10-09 — End: 2021-10-09
  Administered 2021-10-09: 125 ug/min via INTRAVENOUS

## 2021-10-09 MED ORDER — DEXAMETHASONE SODIUM PHOSPHATE 10 MG/ML IJ SOLN
INTRAMUSCULAR | Status: DC | PRN
  Administered 2021-10-09: 10 mg via INTRAVENOUS

## 2021-10-09 NOTE — Progress Notes (Signed)
AHF rounding team consulted/following this admission.  ? ?Please reach out to HF Navigator for any educational needs.  ? ?Earnestine Leys, BSN, RN ?Heart Failure Nurse Navigator ?[phone]  ?

## 2021-10-09 NOTE — Procedures (Signed)
Electrical Cardioversion Procedure Note ?Phillip Young ?[id number] ?Sep 06, 1985 ? ?Procedure: Electrical Cardioversion ?Indications:  Atrial flutter.  The patient was supposed to have TEE but after induction of anesthesia HR rose to 200 bpm and SBP dropped to 50s so we deemed it unsafe to perform TEE and emergently cardioverted him.  ? ?Procedure Details ?Consent: Risks of procedure as well as the alternatives and risks of each were explained to the (patient/caregiver).  Consent for procedure obtained. ?Time Out: Verified patient identification, verified procedure, site/side was marked, verified correct patient position, special equipment/implants available, medications/allergies/relevent history reviewed, required imaging and test results available.  Performed ? ?Patient placed on cardiac monitor, pulse oximetry, supplemental oxygen as necessary.  ?Sedation given:  General anesthesia ?Pacer pads placed anterior and posterior chest. ? ?Cardioverted 1 time(s).  ?Cardioverted at 200J. ? ?Evaluation ?Findings: Post procedure EKG shows: NSR ?Complications: None ?Patient did tolerate procedure well. ? ? ?Valentine Kuechle [name] ?10/09/2021, 2:38 PM ? ? ? ?

## 2021-10-09 NOTE — Transfer of Care (Signed)
Immediate Anesthesia Transfer of Care Note ? ?Patient: Phillip Young ? ?Procedure(s) Performed: CARDIOVERSION ? ?Patient Location: PACU ? ?Anesthesia Type:General ? ?Level of Consciousness: awake, alert  and sedated ? ?Airway & Oxygen Therapy: Patient connected to face mask oxygen ? ?Post-op Assessment: Post -op Vital signs reviewed and stable ? ?Post vital signs: stable ? ?Last Vitals:  ?Vitals Value Taken Time  ?BP 136/113 10/09/21 1514  ?Temp    ?Pulse 101 10/09/21 1514  ?Resp 31 10/09/21 1514  ?SpO2 93 % 10/09/21 1514  ?Vitals shown include unvalidated device data. ? ?Last Pain:  ?Vitals:  ? 10/09/21 1233  ?TempSrc: Temporal  ?PainSc: 0-No pain  ?   ? ?  ? ?Complications: No notable events documented. ?

## 2021-10-09 NOTE — Anesthesia Procedure Notes (Signed)
Procedure Name: Intubation ?Date/Time: 10/09/2021 2:36 PM ?Performed by: Lavell Luster, CRNA ?Pre-anesthesia Checklist: Patient identified, Emergency Drugs available, Suction available, Patient being monitored and Timeout performed ?Patient Re-evaluated:Patient Re-evaluated prior to induction ?Oxygen Delivery Method: Circle system utilized ?Preoxygenation: Pre-oxygenation with 100% oxygen ?Induction Type: IV induction ?Ventilation: Mask ventilation without difficulty and Oral airway inserted - appropriate to patient size ?Laryngoscope Size: Glidescope and 4 ?Grade View: Grade I ?Tube type: Oral ?Tube size: 7.5 mm ?Number of attempts: 1 ?Airway Equipment and Method: Stylet ?Placement Confirmation: ETT inserted through vocal cords under direct vision, positive ETCO2 and breath sounds checked- equal and bilateral ?Secured at: 22 cm ?Tube secured with: Tape ?Dental Injury: Teeth and Oropharynx as per pre-operative assessment  ?Difficulty Due To: Difficulty was anticipated, Difficult Airway- due to large tongue and Difficult Airway- due to limited oral opening ?Future Recommendations: Recommend- induction with short-acting agent, and alternative techniques readily available ? ? ? ? ?

## 2021-10-09 NOTE — Consult Note (Signed)
? ?NAME:  Phillip Young, MRN:  [id number], DOB:  1986-03-16, LOS: 0 ?ADMISSION DATE:  10/09/2021, CONSULTATION DATE:  10/09/21 ?REFERRING MD:  Dr. [name], CHIEF COMPLAINT:  Hypotension  ? ?History of Present Illness:  ? ?36 year old male with prior history of aflutter/ afib on Eliquis, biventricular HF (04/2021 EF 20%, moderately reduced RV systolic function), HTN, ESRD on TTS iHD via R chest TDC, OSA either on CPAP or BIPAP, chronic hypoxic respiratory failure on home O2 prn, morbid obesity, and GERD who presented 3/17 for elective TEE with DCCV.  Underwent induction for procedure and became hypotensive with SBP in the 50's and heart rates in the 200's.  TEE aborted and patient was emergently cardioverted  back to NSR with improvement in hemodynamics.  Patient was extubated to BiPAP.  Patient to be monitored overnight in ICU.  PCCM consulted for assistance with medical care.   ? ?Patient reports being on iHD since last summer.  Had a left LUE AVF but became occluded; currently iHD is accessed via R chest TDC.  Reports he often does not finish full treatments due to cramps.  Last iHD yesterday and is scheduled again for tomorrow.  Reports he uses oxygen as needed at home and often his saturations are in the 80's and that he uses machine every night, unclear if CPAP or BiPAP, reports his settings are "6 over 18".  Additionally reports chronic cough for three years.   ? ?Pertinent  Medical History  ?Biventricular HF, aflutter/ afib,  HTN, ESRD on TTS iHD via R chest TDC, OSA, chronic hypoxic respiratory failure on home O2 and ?CPAP vs NIV q HS, morbid obesity, GERD, IDA, prior smoker, hx of HIT ? ?Significant Hospital Events: ?Including procedures, antibiotic start and stop dates in addition to other pertinent events   ?3/17 scheduled for TEE, hypotensive/ tachycardic with induction requiring emergent cardioversion, TEE aborted  ? ?Interim History / Subjective:  ?Patient reports he feels fine and wanting to home.   ?PACU  RN reports patient had taken NIV off and desaturated into the low 80's therefore put back on.  ? ?Objective   ?Blood pressure 100/88, pulse 100, temperature 98 ?F (36.7 ?C), resp. rate (!) 21, SpO2 99 %. ?   ?FiO2 (%):  [40 %] 40 %  ?No intake or output data in the 24 hours ending 10/09/21 1549 ?There were no vitals filed for this visit. ? ?Examination: ?General:  very pleasant morbidly obese adult male sitting upright in bed in NAD ?HEENT: full face mask- leaks some ?Neuro: Awake, talking thru mask, oriented, MAE ?CV: rr, NSR to ST, distant HS, R chest TDC ?PULM:  non labored, CTA/ diminished, on 18/8 and 40% getting 500 TVs ?GI: soft, bs active  ?Extremities: warm/dry, trace LE pitting edema  ?Skin: no rashes ? ?Labs:  Na 134, K 5.9, BUN 32, sCr 7.44, VBG 7.365/ 40.3 ? ?Resolved Hospital Problem list   ? ?Assessment & Plan:  ? ?Hypotension with anesthesia induction ?- since resolved, remains hemodynamically stable  ? ? ?Chronic hypoxic respiratory failure and suspect component of chronic hypercarbic respiratory failure/ OHS on home O2 as needed (per patient) ?OSA - unclear if on CPAP vs BiPAP ?- trial of BiPAP in PACU> taken off per pt request and doing well thus far and maintaining saturations on 2L [place] ?- patient will be discharged home ?- has follow-up appt in our office with Dr. Annamaria Boots on 10/29/21 at 11am ?- continue to avoid sedating medications/ ETOH ? ? ?Afib /  aflutter ?- s/p emergent cardioversion back to NSR ?- per HF team  ?- resume Eliquis  ? ? ?Chronic biventricular HF, EF 20% ?- per HF ?- volume removal per iHD ? ? ?ESRD on iHD ?Hyperkalemia  ?- post op BMET with K 5.9 (no reports of hemolysis) ?- seen by nephrology and they are ok with dose of lokelmia and for regular iHD outpt treatment tomorrow if he is cleared to go home tonight ? ? ?Remainder per primary team.  Patient has improved and remains hemodynamically stable and to be discharged home.  PCCM available as needed.  ? ?Best Practice (right click  and "Reselect all SmartList Selections" daily)  ? ?Diet/type: Regular consistency (see orders) ?DVT prophylaxis: DOAC ?GI prophylaxis: N/A ?Lines: N/A; PTA R chest TDC ?Foley:  N/A ?Code Status:  full code ?Last date of multidisciplinary goals of care discussion [per primary ] ? ?Labs   ?CBC: ?No results for input(s): WBC, NEUTROABS, HGB, HCT, MCV, PLT in the last 168 hours. ? ?Basic Metabolic Panel: ?Recent Labs  ?Lab 10/09/21 ?1319  ?NA 134*  ?K 5.9*  ?CL 98  ?CO2 22  ?GLUCOSE 95  ?BUN 32*  ?CREATININE 7.44*  ?CALCIUM 9.4  ? ?GFR: ?Estimated Creatinine Clearance: 23.5 mL/min (A) (by C-G formula based on SCr of 7.44 mg/dL (H)). ?No results for input(s): PROCALCITON, WBC, LATICACIDVEN in the last 168 hours. ? ?Liver Function Tests: ?No results for input(s): AST, ALT, ALKPHOS, BILITOT, PROT, ALBUMIN in the last 168 hours. ?No results for input(s): LIPASE, AMYLASE in the last 168 hours. ?No results for input(s): AMMONIA in the last 168 hours. ? ?ABG ?   ?Component Value Date/Time  ? PHART 7.344 (L) 03/05/2020 1123  ? PCO2ART 73.1 (HH) 03/05/2020 1123  ? PO2ART 69.8 (L) 03/05/2020 1123  ? HCO3 23.0 08/18/2021 1223  ? TCO2 24 08/18/2021 1223  ? ACIDBASEDEF 2.0 08/18/2021 1223  ? O2SAT 92.0 08/18/2021 1223  ?  ? ?Coagulation Profile: ?No results for input(s): INR, PROTIME in the last 168 hours. ? ?Cardiac Enzymes: ?No results for input(s): CKTOTAL, CKMB, CKMBINDEX, TROPONINI in the last 168 hours. ? ?HbA1C: ?Hgb A1c MFr Bld  ?Date/Time Value Ref Range Status  ?08/19/2021 05:40 AM 5.9 (H) 4.8 - 5.6 % Final  ?  Comment:  ?  (NOTE) ?        Prediabetes: 5.7 - 6.4 ?        Diabetes: >6.4 ?        Glycemic control for adults with diabetes: <7.0 ?  ?02/11/2021 09:00 PM 6.5 (H) 4.8 - 5.6 % Final  ?  Comment:  ?  (NOTE) ?Pre diabetes:          5.7%-6.4% ? ?Diabetes:              >6.4% ? ?Glycemic control for   <7.0% ?adults with diabetes ?  ? ? ?CBG: ?No results for input(s): GLUCAP in the last 168 hours. ? ?Review of Systems:    ?Review of Systems  ?Constitutional:  Negative for fever.  ?Respiratory:  Positive for cough. Negative for hemoptysis, sputum production, shortness of breath and wheezing.   ?Cardiovascular:  Positive for leg swelling and PND. Negative for chest pain and palpitations.  ?Gastrointestinal:  Negative for nausea and vomiting.  ?Neurological:  Negative for focal weakness.  ? ?Past Medical History:  ?He,  has a past medical history of Biventricular congestive heart failure ([place]), Class 3 severe obesity due to excess calories with serious comorbidity and body mass  index (BMI) of 50.0 to 59.9 in adult [facility]) (02/26/2020), Essential hypertension (02/26/2020), GERD without esophagitis (02/26/2020), Hidradenitis suppurativa (02/26/2020), OSA (obstructive sleep apnea) (02/26/2020), Prediabetes (02/26/2020), and Renal disorder.  ? ?Surgical History:  ? ?Past Surgical History:  ?Procedure Laterality Date  ? ABSCESS DRAINAGE    ? AV FISTULA PLACEMENT Left 08/21/2021  ? Procedure: LEFT ARM ARTERIOVENOUS (AV) FISTULA.;  Surgeon: Serafina Mitchell, MD;  Location: [facility] OR;  Service: Vascular;  Laterality: Left;  ? IR FLUORO GUIDE CV LINE RIGHT  03/10/2021  ? IR FLUORO GUIDE CV LINE RIGHT  04/22/2021  ? IR FLUORO GUIDE CV LINE RIGHT  08/20/2021  ? IR US GUIDE VASC ACCESS RIGHT  03/10/2021  ? IR US GUIDE VASC ACCESS RIGHT  04/22/2021  ? RIGHT HEART CATH N/A 03/06/2021  ? Procedure: RIGHT HEART CATH;  Surgeon: Jolaine Artist, MD;  Location: [place] CV LAB;  Service: Cardiovascular;  Laterality: N/A;  ? RIGHT/LEFT HEART CATH AND CORONARY ANGIOGRAPHY N/A 03/04/2020  ? Procedure: RIGHT/LEFT HEART CATH AND CORONARY ANGIOGRAPHY;  Surgeon: Larey Dresser, MD;  Location: [place] CV LAB;  Service: Cardiovascular;  Laterality: N/A;  ? TEE WITHOUT CARDIOVERSION N/A 05/05/2021  ? Procedure: TRANSESOPHAGEAL ECHOCARDIOGRAM (TEE);  Surgeon: Larey Dresser, MD;  Location: [facility] ENDOSCOPY;  Service: Cardiovascular;  Laterality: N/A;  ? TEMPORARY  DIALYSIS CATHETER  03/06/2021  ? Procedure: TEMPORARY DIALYSIS CATHETER;  Surgeon: Jolaine Artist, MD;  Location: [place] CV LAB;  Service: Cardiovascular;;  ?  ? ?Social History:  ? reports tha

## 2021-10-09 NOTE — Anesthesia Preprocedure Evaluation (Addendum)
Anesthesia Evaluation  ?Patient identified by MRN, date of birth, ID band ?Patient awake ? ? ? ?Reviewed: ?Allergy & Precautions, NPO status , Patient's Chart, lab work & pertinent test results ? ?Airway ?Mallampati: III ? ?TM Distance: >3 FB ?Neck ROM: Full ? ? ? Dental ? ?(+) Teeth Intact, Dental Advisory Given ?  ?Pulmonary ?shortness of breath, sleep apnea , former smoker,  ?  ?Pulmonary exam normal ?breath sounds clear to auscultation ? ? ? ? ? ? Cardiovascular ?hypertension, +CHF  ?Normal cardiovascular exam+ dysrhythmias Atrial Fibrillation  ?Rhythm:Regular Rate:Normal ? ?TTE 2022 ??1. Left ventricular ejection fraction, by estimation, is 25 to 30%. The  ?left ventricle has severely decreased function. The left ventricle  ?demonstrates global hypokinesis. The left ventricular internal cavity size  ?was moderately dilated. Left  ?ventricular diastolic parameters are indeterminate.  ??2. Right ventricular systolic function is normal. The right ventricular  ?size is normal. There is moderately elevated pulmonary artery systolic  ?pressure. The estimated right ventricular systolic pressure is 02.5 mmHg.  ??3. The mitral valve is normal in structure. Trivial mitral valve  ?regurgitation. No evidence of mitral stenosis.  ??4. The aortic valve is normal in structure. Aortic valve regurgitation is  ?not visualized. No aortic stenosis is present.  ??5. The inferior vena cava is normal in size with greater than 50%  ?respiratory variability, suggesting right atrial pressure of 3 mmHg.  ?  ?Neuro/Psych ?negative neurological ROS ?   ? GI/Hepatic ?Neg liver ROS, GERD  ,  ?Endo/Other  ?Morbid obesity ? Renal/GU ?ESRF and DialysisRenal diseaseLab Results ?     Component                Value               Date                 ?     CREATININE               4.35 (H)            05/04/2021           ?     BUN                      31 (H)              05/04/2021           ?     NA                        130 (L)             05/04/2021           ?     K                        4.2                 05/04/2021           ?     CL                       95 (L)              05/04/2021           ?     CO2  26                  05/04/2021           ?  ? ?  ?Musculoskeletal ?negative musculoskeletal ROS ?(+)  ? Abdominal ?  ?Peds ? Hematology ? ?(+) Blood dyscrasia (on eliquis), anemia , Lab Results ?     Component                Value               Date                 ?     WBC                      8.5                 05/04/2021           ?     HGB                      10.6 (L)            05/04/2021           ?     HCT                      33.5 (L)            05/04/2021           ?     MCV                      96.5                05/04/2021           ?     PLT                      406 (H)             05/04/2021           ?   ?Anesthesia Other Findings ? ? Reproductive/Obstetrics ? ?  ? ? ? ? ? ? ? ? ? ? ? ? ? ?  ?  ? ? ? ? ? ? ? ? ?Anesthesia Physical ? ?Anesthesia Plan ? ?ASA: 4 ? ?Anesthesia Plan: General  ? ?Post-op Pain Management: Minimal or no pain anticipated  ? ?Induction: Intravenous and Rapid sequence ? ?PONV Risk Score and Plan: 2 and Ondansetron and Treatment may vary due to age or medical condition ? ?Airway Management Planned: Oral ETT and Video Laryngoscope Planned ? ?Additional Equipment:  ? ?Intra-op Plan:  ? ?Post-operative Plan: Extubation in OR ? ?Informed Consent: I have reviewed the patients History and Physical, chart, labs and discussed the procedure including the risks, benefits and alternatives for the proposed anesthesia with the patient or authorized representative who has indicated his/her understanding and acceptance.  ? ? ? ?Dental advisory given ? ?Plan Discussed with: CRNA ? ?Anesthesia Plan Comments:   ? ? ? ? ? ? ?Anesthesia Quick Evaluation ? ?

## 2021-10-09 NOTE — Progress Notes (Signed)
RT called to PACU to place patient on Bipap.  Patient placed on 18/8, 40%, and a back up rate of 18. Patient tolerating well at this time and achieving VTs of 450. RN at bedside.  ?

## 2021-10-09 NOTE — Interval H&P Note (Signed)
History and Physical Interval Note: ? ?10/09/2021 ?2:23 PM ? ?Phillip Young  has presented today for surgery, with the diagnosis of AFIB.  The various methods of treatment have been discussed with the patient and family. After consideration of risks, benefits and other options for treatment, the patient has consented to  Procedure(s): ?TRANSESOPHAGEAL ECHOCARDIOGRAM (TEE) (N/A) ?CARDIOVERSION (N/A) as a surgical intervention.  The patient's history has been reviewed, patient examined, no change in status, stable for surgery.  I have reviewed the patient's chart and labs.  Questions were answered to the patient's satisfaction.   ? ? ?Arrin Pintor [name] ? ? ?

## 2021-10-10 DIAGNOSIS — Z992 Dependence on renal dialysis: Secondary | ICD-10-CM | POA: Diagnosis not present

## 2021-10-10 DIAGNOSIS — T8249XA Other complication of vascular dialysis catheter, initial encounter: Secondary | ICD-10-CM | POA: Diagnosis not present

## 2021-10-10 DIAGNOSIS — N186 End stage renal disease: Secondary | ICD-10-CM | POA: Diagnosis not present

## 2021-10-10 DIAGNOSIS — N2581 Secondary hyperparathyroidism of renal origin: Secondary | ICD-10-CM | POA: Diagnosis not present

## 2021-10-12 ENCOUNTER — Encounter (HOSPITAL_COMMUNITY): Payer: Self-pay | Admitting: Cardiology

## 2021-10-12 NOTE — Discharge Summary (Signed)
Advanced Heart Failure Team ? ?Discharge Summary  ? ?Patient ID: Phillip Young ?MRN: [id number], DOB/AGE: 1986-06-23 36 y.o. Admit date: 10/09/2021 ?D/C date:     10/12/2021  ? ?Primary Discharge Diagnoses:  ?A fib ?A/C Hypoxic& Hypercapnia Respiratory Failure ?ESRD ? ?Hospital Course:  ? ?Phillip Young  has presented for surgery, with the diagnosis of AFIB. The patient was supposed to have TEE but after induction of anesthesia HR rose to 200 bpm and SBP dropped to 50s so we deemed it unsafe to perform TEE and emergently cardioverted him. Converted to NSR.  In the recovery room he developed respiratory distress and was placed on Bipap. CCM consulted.  Plan to admit but he improved and was taken off Bipap. Sats remained stable on [place]. Discharged to home with plans for HD on 10/10/21.  ? ?Discharge Vitals: Blood pressure (!) 112/93, pulse (!) 104, temperature 98.2 ?F (36.8 ?C), resp. rate (!) 22, SpO2 100 %. ? ?Labs: ?Lab Results  ?Component Value Date  ? WBC 9.1 09/04/2021  ? HGB 9.4 (L) 09/04/2021  ? HCT 29.3 (L) 09/04/2021  ? MCV 98.7 09/04/2021  ? PLT 234 09/04/2021  ?  ?Recent Labs  ?Lab 10/09/21 ?1319  ?NA 134*  ?K 5.9*  ?CL 98  ?CO2 22  ?BUN 32*  ?CREATININE 7.44*  ?CALCIUM 9.4  ?GLUCOSE 95  ? ?Lab Results  ?Component Value Date  ? TRIG 137 07/29/2020  ? ?BNP (last 3 results) ?Recent Labs  ?  04/12/21 ?1555 08/18/21 ?1132 09/02/21 ?1030  ?BNP 658.4* 653.3* 839.9*  ? ? ?ProBNP (last 3 results) ?No results for input(s): PROBNP in the last 8760 hours. ? ? ?Diagnostic Studies/Procedures  ? ?No results found. ? ?Discharge Medications  ? ?Allergies as of 10/09/2021   ? ?   Reactions  ? Coreg [carvedilol] Shortness Of Breath, Diarrhea  ? Wheezing   ? Heparin Other (See Comments)  ? 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 ?  ? ?  ?Medication List  ?  ? ?TAKE these medications   ? ?benzonatate 200 MG capsule ?Commonly known as: TESSALON ?Take 1 capsule (200 mg total) by mouth 3 (three) times daily as needed for  cough. ?  ?Eliquis 5 MG Tabs tablet ?Generic drug: apixaban ?TAKE 1 TABLET(5 MG) BY MOUTH TWICE DAILY ?  ?gabapentin 100 MG capsule ?Commonly known as: NEURONTIN ?Take 100 mg by mouth 3 (three) times daily. ?  ?lanthanum 500 MG chewable tablet ?Commonly known as: FOSRENOL ?Chew 500 mg by mouth 3 (three) times daily with meals. ?  ?levalbuterol 45 MCG/ACT inhaler ?Commonly known as: Xopenex HFA ?Inhale 1-2 puffs into the lungs every 4 (four) hours as needed for wheezing or shortness of breath (coughing fits). ?  ? ?  ? ? ?Disposition  ? ?The patient will be discharged in stable condition to home. ? ?  ? ? ?Duration of Discharge Encounter: Greater than 35 minutes  ? ?Signed, ?Aayush Gelpi NP_C  ?10/12/2021, 10:37 AM ? ?

## 2021-10-12 NOTE — Anesthesia Postprocedure Evaluation (Signed)
Anesthesia Post Note ? ?Patient: Phillip Young ? ?Procedure(s) Performed: CARDIOVERSION ? ?  ? ?Patient location during evaluation: PACU ?Anesthesia Type: General ?Level of consciousness: awake and alert ?Pain management: pain level controlled ?Vital Signs Assessment: post-procedure vital signs reviewed and stable ?Respiratory status: spontaneous breathing, nonlabored ventilation and respiratory function stable ?Cardiovascular status: blood pressure returned to baseline and stable ?Postop Assessment: no apparent nausea or vomiting ?Anesthetic complications: no ? ? ?No notable events documented. ? ?Last Vitals:  ?Vitals:  ? 10/09/21 1600 10/09/21 1615  ?BP: 95/68 (!) 112/93  ?Pulse: (!) 103 (!) 104  ?Resp: 20 (!) 22  ?Temp:  36.8 ?C  ?SpO2: 96% 100%  ?  ?Last Pain:  ?Vitals:  ? 10/09/21 1615  ?TempSrc:   ?PainSc: 0-No pain  ? ? ?  ?  ?  ?  ?  ?  ? ?[name] ? ? ? ? ?

## 2021-10-13 DIAGNOSIS — N186 End stage renal disease: Secondary | ICD-10-CM | POA: Diagnosis not present

## 2021-10-13 DIAGNOSIS — T8249XA Other complication of vascular dialysis catheter, initial encounter: Secondary | ICD-10-CM | POA: Diagnosis not present

## 2021-10-13 DIAGNOSIS — I509 Heart failure, unspecified: Secondary | ICD-10-CM | POA: Diagnosis not present

## 2021-10-13 DIAGNOSIS — N2581 Secondary hyperparathyroidism of renal origin: Secondary | ICD-10-CM | POA: Diagnosis not present

## 2021-10-13 DIAGNOSIS — J9610 Chronic respiratory failure, unspecified whether with hypoxia or hypercapnia: Secondary | ICD-10-CM | POA: Diagnosis not present

## 2021-10-13 DIAGNOSIS — Z992 Dependence on renal dialysis: Secondary | ICD-10-CM | POA: Diagnosis not present

## 2021-10-13 DIAGNOSIS — I5023 Acute on chronic systolic (congestive) heart failure: Secondary | ICD-10-CM | POA: Diagnosis not present

## 2021-10-15 DIAGNOSIS — N2581 Secondary hyperparathyroidism of renal origin: Secondary | ICD-10-CM | POA: Diagnosis not present

## 2021-10-15 DIAGNOSIS — N186 End stage renal disease: Secondary | ICD-10-CM | POA: Diagnosis not present

## 2021-10-15 DIAGNOSIS — T8249XA Other complication of vascular dialysis catheter, initial encounter: Secondary | ICD-10-CM | POA: Diagnosis not present

## 2021-10-15 DIAGNOSIS — Z992 Dependence on renal dialysis: Secondary | ICD-10-CM | POA: Diagnosis not present

## 2021-10-16 DIAGNOSIS — I5023 Acute on chronic systolic (congestive) heart failure: Secondary | ICD-10-CM | POA: Diagnosis not present

## 2021-10-16 DIAGNOSIS — I5082 Biventricular heart failure: Secondary | ICD-10-CM | POA: Diagnosis not present

## 2021-10-17 DIAGNOSIS — T8249XA Other complication of vascular dialysis catheter, initial encounter: Secondary | ICD-10-CM | POA: Diagnosis not present

## 2021-10-17 DIAGNOSIS — N2581 Secondary hyperparathyroidism of renal origin: Secondary | ICD-10-CM | POA: Diagnosis not present

## 2021-10-17 DIAGNOSIS — Z992 Dependence on renal dialysis: Secondary | ICD-10-CM | POA: Diagnosis not present

## 2021-10-17 DIAGNOSIS — N186 End stage renal disease: Secondary | ICD-10-CM | POA: Diagnosis not present

## 2021-10-20 ENCOUNTER — Ambulatory Visit: Payer: BC Managed Care – PPO | Admitting: Infectious Diseases

## 2021-10-20 DIAGNOSIS — Z992 Dependence on renal dialysis: Secondary | ICD-10-CM | POA: Diagnosis not present

## 2021-10-20 DIAGNOSIS — N186 End stage renal disease: Secondary | ICD-10-CM | POA: Diagnosis not present

## 2021-10-20 DIAGNOSIS — T8249XA Other complication of vascular dialysis catheter, initial encounter: Secondary | ICD-10-CM | POA: Diagnosis not present

## 2021-10-20 DIAGNOSIS — N2581 Secondary hyperparathyroidism of renal origin: Secondary | ICD-10-CM | POA: Diagnosis not present

## 2021-10-22 DIAGNOSIS — N2581 Secondary hyperparathyroidism of renal origin: Secondary | ICD-10-CM | POA: Diagnosis not present

## 2021-10-22 DIAGNOSIS — N186 End stage renal disease: Secondary | ICD-10-CM | POA: Diagnosis not present

## 2021-10-22 DIAGNOSIS — T8249XA Other complication of vascular dialysis catheter, initial encounter: Secondary | ICD-10-CM | POA: Diagnosis not present

## 2021-10-22 DIAGNOSIS — Z992 Dependence on renal dialysis: Secondary | ICD-10-CM | POA: Diagnosis not present

## 2021-10-22 NOTE — Interval H&P Note (Signed)
History and Physical Interval Note: ? ?10/22/2021 ?10:00 AM ? ?Phillip Young  has presented today for surgery, with the diagnosis of AFIB.  The various methods of treatment have been discussed with the patient and family. After consideration of risks, benefits and other options for treatment, the patient has consented to  Procedure(s): ?CARDIOVERSION (N/A) as a surgical intervention.  The patient's history has been reviewed, patient examined, no change in status, stable for surgery.  I have reviewed the patient's chart and labs.  Questions were answered to the patient's satisfaction.   ? ? ?Jamaul Heist [name] ? ? ?

## 2021-10-22 NOTE — Interval H&P Note (Signed)
History and Physical Interval Note: ? ?10/22/2021 ?10:01 AM ? ?Phillip Young  has presented today for surgery, with the diagnosis of AFIB.  The various methods of treatment have been discussed with the patient and family. After consideration of risks, benefits and other options for treatment, the patient has consented to  Procedure(s): ?CARDIOVERSION (N/A) as a surgical intervention.  The patient's history has been reviewed, patient examined, no change in status, stable for surgery.  I have reviewed the patient's chart and labs.  Questions were answered to the patient's satisfaction.   ? ? ?Annasofia Pohl [name] ? ? ?

## 2021-10-22 NOTE — Interval H&P Note (Signed)
History and Physical Interval Note: ? ?10/22/2021 ?10:00 AM ? ?Navi Erber  has presented today for surgery, with the diagnosis of AFIB.  The various methods of treatment have been discussed with the patient and family. After consideration of risks, benefits and other options for treatment, the patient has consented to  Procedure(s): ?CARDIOVERSION (N/A) as a surgical intervention.  The patient's history has been reviewed, patient examined, no change in status, stable for surgery.  I have reviewed the patient's chart and labs.  Questions were answered to the patient's satisfaction.   ? ? ?Malena Timpone [name] ? ? ?

## 2021-10-23 DIAGNOSIS — Z992 Dependence on renal dialysis: Secondary | ICD-10-CM | POA: Diagnosis not present

## 2021-10-23 DIAGNOSIS — N186 End stage renal disease: Secondary | ICD-10-CM | POA: Diagnosis not present

## 2021-10-23 DIAGNOSIS — E1122 Type 2 diabetes mellitus with diabetic chronic kidney disease: Secondary | ICD-10-CM | POA: Diagnosis not present

## 2021-10-24 DIAGNOSIS — T8249XA Other complication of vascular dialysis catheter, initial encounter: Secondary | ICD-10-CM | POA: Diagnosis not present

## 2021-10-24 DIAGNOSIS — N186 End stage renal disease: Secondary | ICD-10-CM | POA: Diagnosis not present

## 2021-10-24 DIAGNOSIS — I5082 Biventricular heart failure: Secondary | ICD-10-CM | POA: Diagnosis not present

## 2021-10-24 DIAGNOSIS — I5023 Acute on chronic systolic (congestive) heart failure: Secondary | ICD-10-CM | POA: Diagnosis not present

## 2021-10-24 DIAGNOSIS — Z992 Dependence on renal dialysis: Secondary | ICD-10-CM | POA: Diagnosis not present

## 2021-10-24 DIAGNOSIS — N2581 Secondary hyperparathyroidism of renal origin: Secondary | ICD-10-CM | POA: Diagnosis not present

## 2021-10-26 ENCOUNTER — Telehealth: Payer: Self-pay | Admitting: *Deleted

## 2021-10-26 NOTE — Telephone Encounter (Signed)
Patient has a sleep consult with dr young (pulmonary) on 11/16/21 and wants to follow up with him. Titration cancelled. ?

## 2021-10-27 DIAGNOSIS — N2581 Secondary hyperparathyroidism of renal origin: Secondary | ICD-10-CM | POA: Diagnosis not present

## 2021-10-27 DIAGNOSIS — N186 End stage renal disease: Secondary | ICD-10-CM | POA: Diagnosis not present

## 2021-10-27 DIAGNOSIS — Z992 Dependence on renal dialysis: Secondary | ICD-10-CM | POA: Diagnosis not present

## 2021-10-27 DIAGNOSIS — T8249XA Other complication of vascular dialysis catheter, initial encounter: Secondary | ICD-10-CM | POA: Diagnosis not present

## 2021-10-28 ENCOUNTER — Ambulatory Visit: Payer: BC Managed Care – PPO | Admitting: Allergy

## 2021-10-28 DIAGNOSIS — J309 Allergic rhinitis, unspecified: Secondary | ICD-10-CM

## 2021-10-28 NOTE — Progress Notes (Deleted)
? ?Follow Up Note ? ?RE: Phillip Young MRN: [id number] DOB: [birth year] ?Date of Office Visit: 10/28/2021 ? ?Referring provider: [name], * ?Primary care provider: [name], MD ? ?Chief Complaint: No chief complaint on file. ? ?History of Present Illness: ?I had the pleasure of seeing Phillip Young for a follow up visit at the [facility] on 10/28/2021. He is a 36 y.o. male, who is being followed for cough, pruritus and recurrent infections. His previous allergy office visit was on 09/30/2021 with Dr. Maudie Mercury. Today is a regular follow up visit. ? ?Was the bw done? ? ?Chronic cough ?36 year old AA male with a complicated medical history including ESRD on HD, CHF, Pafib, morbid obesity, OSA on BiPAP and prediabetic with today's complaint of coughing with post tussive emesis, shortness of breath, wheezing x 3 years. Used albuterol nebulizer while hospitalized which caused palpitations. Concerned if has asthma or allergies. These episodes seem to be worse when driving in his car. Covid-19 in January 2022.  ?Today's spirometry showed severe restriction with 20% improvement in FEV1 post bronchodilator treatment - only used xopenex 2 puffs due to his afib. Clinically slightly improved. ?Discussed with patient that his respiratory symptoms are most likely multifactorial in nature and worsened by his underlying medical history of CHF, afib and ESRD.  ?May use levoalbuterol rescue inhaler 2 puffs or nebulizer every 4 to 6 hours as needed for shortness of breath, chest tightness, coughing, and wheezing. Monitor frequency of use.  ?Spacer given and demonstrated proper use with inhaler. Patient understood technique and all questions/concerned were addressed.  ?Get bloodwork rule out other etiologies. ?Follow up with PCP, nephrologist and cardiologist as scheduled. ?  ?Pruritus ?Second complaint of pruritus without a rash.  ?See below for proper skincare. ?Get bloodwork to rule out other  etiologies.  ?  ?Recurrent infections ?Followed by ID due to recent bacteremia and osteomyelitis - thought to be due to infected port? ?Get bloodwork to look at immune system.  ?  ?Return in about 4 weeks (around 10/28/2021). ?  ? ?Assessment and Plan: ?Phillip Young is a 36 y.o. male with: ?No problem-specific Assessment & Plan notes found for this encounter. ? ?No follow-ups on file. ? ?No orders of the defined types were placed in this encounter. ? ?Lab Orders  ?No laboratory test(s) ordered today  ? ? ?Diagnostics: ?Spirometry:  ?Tracings reviewed. His effort: {Blank single:19197::"Good reproducible efforts.","It was hard to get consistent efforts and there is a question as to whether this reflects a maximal maneuver.","Poor effort, data can not be interpreted."} ?FVC: ***L ?FEV1: ***L, ***% predicted ?FEV1/FVC ratio: ***% ?Interpretation: {Blank single:19197::"Spirometry consistent with mild obstructive disease","Spirometry consistent with moderate obstructive disease","Spirometry consistent with severe obstructive disease","Spirometry consistent with possible restrictive disease","Spirometry consistent with mixed obstructive and restrictive disease","Spirometry uninterpretable due to technique","Spirometry consistent with normal pattern","No overt abnormalities noted given today's efforts"}.  ?Please see scanned spirometry results for details. ? ?Skin Testing: {Blank single:19197::"Select foods","Environmental allergy panel","Environmental allergy panel and select foods","Food allergy panel","None","Deferred due to recent antihistamines use"}. ?*** ?Results discussed with patient/family. ? ? ?Medication List:  ?Current Outpatient Medications  ?Medication Sig Dispense Refill  ? benzonatate (TESSALON) 200 MG capsule Take 1 capsule (200 mg total) by mouth 3 (three) times daily as needed for cough. 30 capsule 0  ? ELIQUIS 5 MG TABS tablet TAKE 1 TABLET(5 MG) BY MOUTH TWICE DAILY 180 tablet 3  ? gabapentin (NEURONTIN) 100  MG capsule Take 100 mg by mouth 3 (three) times  daily.    ? lanthanum (FOSRENOL) 500 MG chewable tablet Chew 500 mg by mouth 3 (three) times daily with meals.    ? levalbuterol (XOPENEX HFA) 45 MCG/ACT inhaler Inhale 1-2 puffs into the lungs every 4 (four) hours as needed for wheezing or shortness of breath (coughing fits). 1 each 1  ? ?No current facility-administered medications for this visit.  ? ?Allergies: ?Allergies  ?Allergen Reactions  ? Coreg [Carvedilol] Shortness Of Breath and Diarrhea  ?  Wheezing   ? Heparin Other (See Comments)  ?  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 ?I reviewed his past medical history, social history, family history, and environmental history and no significant changes have been reported from his previous visit. ? ?Review of Systems  ?Constitutional:  Positive for fatigue. Negative for appetite change, chills, fever and unexpected weight change.  ?HENT:  Negative for congestion and rhinorrhea.   ?Eyes:  Negative for itching.  ?Respiratory:  Positive for cough, chest tightness, shortness of breath and wheezing.   ?Cardiovascular:  Positive for palpitations and leg swelling. Negative for chest pain.  ?Gastrointestinal:  Negative for abdominal pain.  ?Genitourinary:  Positive for difficulty urinating.  ?Skin:  Negative for rash.  ?Neurological:  Negative for headaches.  ? ?Objective: ?There were no vitals taken for this visit. ?There is no height or weight on file to calculate BMI. ?Physical Exam ?Vitals and nursing note reviewed.  ?Constitutional:   ?   Appearance: Normal appearance. He is well-developed. He is obese.  ?HENT:  ?   Head: Normocephalic and atraumatic.  ?   Right Ear: Tympanic membrane and external ear normal.  ?   Left Ear: Tympanic membrane and external ear normal.  ?   Nose: Nose normal.  ?   Mouth/Throat:  ?   Mouth: Mucous membranes are moist.  ?   Pharynx: Oropharynx is clear.  ?Eyes:  ?   Conjunctiva/sclera: Conjunctivae normal.  ?Cardiovascular:  ?    Rate and Rhythm: Normal rate and regular rhythm.  ?   Heart sounds: Normal heart sounds. No murmur heard. ?  No friction rub. No gallop.  ?Pulmonary:  ?   Effort: Pulmonary effort is normal.  ?   Breath sounds: Normal breath sounds. No wheezing, rhonchi or rales.  ?Musculoskeletal:  ?   Cervical back: Neck supple.  ?Skin: ?   General: Skin is warm.  ?   Findings: No rash.  ?Neurological:  ?   Mental Status: He is alert and oriented to person, place, and time.  ? ?Previous notes and tests were reviewed. ?The plan was reviewed with the patient/family, and all questions/concerned were addressed. ? ?It was my pleasure to see Kaan today and participate in his care. Please feel free to contact me with any questions or concerns. ? ?Sincerely, ? ?Rexene Alberts, DO ?Allergy & Immunology ? ?[facility] ?[place] office: [phone] ?[place] office: [phone] ?

## 2021-10-28 NOTE — Telephone Encounter (Deleted)
APPROVED [employer] READ NO PA REQUIRED ?AUTH #[id number] ?

## 2021-10-29 ENCOUNTER — Institutional Professional Consult (permissible substitution): Payer: BC Managed Care – PPO | Admitting: Internal Medicine

## 2021-10-29 DIAGNOSIS — T8249XA Other complication of vascular dialysis catheter, initial encounter: Secondary | ICD-10-CM | POA: Diagnosis not present

## 2021-10-29 DIAGNOSIS — N2581 Secondary hyperparathyroidism of renal origin: Secondary | ICD-10-CM | POA: Diagnosis not present

## 2021-10-29 DIAGNOSIS — Z992 Dependence on renal dialysis: Secondary | ICD-10-CM | POA: Diagnosis not present

## 2021-10-29 DIAGNOSIS — N186 End stage renal disease: Secondary | ICD-10-CM | POA: Diagnosis not present

## 2021-10-31 DIAGNOSIS — N186 End stage renal disease: Secondary | ICD-10-CM | POA: Diagnosis not present

## 2021-10-31 DIAGNOSIS — Z992 Dependence on renal dialysis: Secondary | ICD-10-CM | POA: Diagnosis not present

## 2021-10-31 DIAGNOSIS — T8249XA Other complication of vascular dialysis catheter, initial encounter: Secondary | ICD-10-CM | POA: Diagnosis not present

## 2021-10-31 DIAGNOSIS — N2581 Secondary hyperparathyroidism of renal origin: Secondary | ICD-10-CM | POA: Diagnosis not present

## 2021-11-03 ENCOUNTER — Encounter: Payer: Self-pay | Admitting: Student

## 2021-11-03 ENCOUNTER — Telehealth: Payer: Self-pay

## 2021-11-03 DIAGNOSIS — T8249XA Other complication of vascular dialysis catheter, initial encounter: Secondary | ICD-10-CM | POA: Diagnosis not present

## 2021-11-03 DIAGNOSIS — N186 End stage renal disease: Secondary | ICD-10-CM | POA: Diagnosis not present

## 2021-11-03 DIAGNOSIS — Z992 Dependence on renal dialysis: Secondary | ICD-10-CM | POA: Diagnosis not present

## 2021-11-03 DIAGNOSIS — N2581 Secondary hyperparathyroidism of renal origin: Secondary | ICD-10-CM | POA: Diagnosis not present

## 2021-11-03 NOTE — Telephone Encounter (Signed)
Electronically sent letter. Thanks! ?

## 2021-11-03 NOTE — Telephone Encounter (Signed)
Requesting a letter for work. Please call pt back.  

## 2021-11-03 NOTE — Telephone Encounter (Signed)
Called pt. Dates 09/28/21 - 10/23/21. ?

## 2021-11-03 NOTE — Telephone Encounter (Signed)
We will need the exact dates he was out for the letter. Thanks!  ?

## 2021-11-03 NOTE — Telephone Encounter (Signed)
Pt called / informed of letter. ?

## 2021-11-03 NOTE — Telephone Encounter (Signed)
Called pt who stated he needs a letter for his job stating he was out of work x 1 month and the reason why (respiratory issues) Also include he has a sleep study appt on 4/24 (with Dr Annamaria Boots). Requesting letter sent to him via My Chart. ?Thanks ?

## 2021-11-03 NOTE — Telephone Encounter (Signed)
Patient called back requesting additional info in letter. 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 He is in need of these additions to letter in order to return to work. ?

## 2021-11-04 NOTE — Telephone Encounter (Signed)
Updated letter sent.

## 2021-11-04 NOTE — Telephone Encounter (Signed)
Will add additional information to letter this afternoon. Thank you! ?

## 2021-11-04 NOTE — Telephone Encounter (Signed)
Patient calling again for updated letter to include he is able to return to work and his sleep apnea is being controlled by the BiPAP machine. ?

## 2021-11-04 NOTE — Progress Notes (Signed)
Updated letter sent.

## 2021-11-05 DIAGNOSIS — J9610 Chronic respiratory failure, unspecified whether with hypoxia or hypercapnia: Secondary | ICD-10-CM | POA: Diagnosis not present

## 2021-11-05 DIAGNOSIS — I509 Heart failure, unspecified: Secondary | ICD-10-CM | POA: Diagnosis not present

## 2021-11-05 DIAGNOSIS — I5023 Acute on chronic systolic (congestive) heart failure: Secondary | ICD-10-CM | POA: Diagnosis not present

## 2021-11-05 DIAGNOSIS — E662 Morbid (severe) obesity with alveolar hypoventilation: Secondary | ICD-10-CM | POA: Diagnosis not present

## 2021-11-05 DIAGNOSIS — N186 End stage renal disease: Secondary | ICD-10-CM | POA: Diagnosis not present

## 2021-11-05 DIAGNOSIS — T8249XA Other complication of vascular dialysis catheter, initial encounter: Secondary | ICD-10-CM | POA: Diagnosis not present

## 2021-11-05 DIAGNOSIS — Z992 Dependence on renal dialysis: Secondary | ICD-10-CM | POA: Diagnosis not present

## 2021-11-05 DIAGNOSIS — N2581 Secondary hyperparathyroidism of renal origin: Secondary | ICD-10-CM | POA: Diagnosis not present

## 2021-11-06 ENCOUNTER — Telehealth: Payer: Self-pay

## 2021-11-06 NOTE — Telephone Encounter (Signed)
Pt states he did received the letter for work that Dr. Johnney Ou wrote for him on 11/04/2021. His work place will not accept the letter, it needs a reason to why he is out. Please call pt back.  ?

## 2021-11-07 DIAGNOSIS — N2581 Secondary hyperparathyroidism of renal origin: Secondary | ICD-10-CM | POA: Diagnosis not present

## 2021-11-07 DIAGNOSIS — T8249XA Other complication of vascular dialysis catheter, initial encounter: Secondary | ICD-10-CM | POA: Diagnosis not present

## 2021-11-07 DIAGNOSIS — Z992 Dependence on renal dialysis: Secondary | ICD-10-CM | POA: Diagnosis not present

## 2021-11-07 DIAGNOSIS — N186 End stage renal disease: Secondary | ICD-10-CM | POA: Diagnosis not present

## 2021-11-07 NOTE — Telephone Encounter (Signed)
Updated note written! ?

## 2021-11-10 DIAGNOSIS — T8249XA Other complication of vascular dialysis catheter, initial encounter: Secondary | ICD-10-CM | POA: Diagnosis not present

## 2021-11-10 DIAGNOSIS — Z992 Dependence on renal dialysis: Secondary | ICD-10-CM | POA: Diagnosis not present

## 2021-11-10 DIAGNOSIS — N2581 Secondary hyperparathyroidism of renal origin: Secondary | ICD-10-CM | POA: Diagnosis not present

## 2021-11-10 DIAGNOSIS — N186 End stage renal disease: Secondary | ICD-10-CM | POA: Diagnosis not present

## 2021-11-12 DIAGNOSIS — N2581 Secondary hyperparathyroidism of renal origin: Secondary | ICD-10-CM | POA: Diagnosis not present

## 2021-11-12 DIAGNOSIS — Z992 Dependence on renal dialysis: Secondary | ICD-10-CM | POA: Diagnosis not present

## 2021-11-12 DIAGNOSIS — N186 End stage renal disease: Secondary | ICD-10-CM | POA: Diagnosis not present

## 2021-11-12 DIAGNOSIS — T8249XA Other complication of vascular dialysis catheter, initial encounter: Secondary | ICD-10-CM | POA: Diagnosis not present

## 2021-11-13 ENCOUNTER — Telehealth: Payer: Self-pay

## 2021-11-13 DIAGNOSIS — I509 Heart failure, unspecified: Secondary | ICD-10-CM | POA: Diagnosis not present

## 2021-11-13 DIAGNOSIS — J9610 Chronic respiratory failure, unspecified whether with hypoxia or hypercapnia: Secondary | ICD-10-CM | POA: Diagnosis not present

## 2021-11-13 DIAGNOSIS — I5023 Acute on chronic systolic (congestive) heart failure: Secondary | ICD-10-CM | POA: Diagnosis not present

## 2021-11-13 NOTE — Telephone Encounter (Signed)
Referral received from dialysis center to schedule patient for left first stage BVT vs AVGG with Dr. Trula Slade as previously discussed. Spoke with patient today and he declined to schedule once again stating, he can't take any more time off work and his kidneys were injured prior versus in failure and so he would rather wait to be on dialysis for one year before making this decision to proceed. I informed him of the increased risk of infection, the longer he leaves the temporary catheter in place and encouraged him to discuss his reservations with his nephrologist. Patient verbalized understanding. Informed Dr. Trula Slade of patient decision.  ?

## 2021-11-13 NOTE — Progress Notes (Signed)
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 Ended early-incomplete.  ?Epworth-9 ?Trilogy NIV  VTE 437 ml, used>4 hrs 76.5 % days, Avg IPAP 21.3, Avg EPAP 11.2/ [employer]     with O2 1-2L/sleep [employer]      ?Body weight today-409 lbs ?Covid vax-No ?Flu vax-No ?------BIPAP. He has been pulled out of work. 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 Patient feels better since starting the BIPAP ?Now out of work a couple of weeks until we can give him a letter indicating he is okay to drive.  He understands we are waiting on the download.  Apparently he has a Copy which she says he uses with oxygen every night.  It makes him feel "a lot better".  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  I emphasized the importance of safe, alert driving. ?No ENT surgery.  Denies family history of sleep apnea.  Denies use of sleep medicines or caffeine. ?He is single, living alone. ? ?Prior to Admission medications   ?Medication Sig Start Date End Date Taking? Authorizing Provider  ?benzonatate (TESSALON) 200 MG capsule Take 1 capsule (200 mg total) by mouth 3 (three) times daily as needed for cough. 09/28/21  Yes Lajean Manes, MD  ?Arne Cleveland 5 MG TABS tablet TAKE 1 TABLET(5 MG) BY MOUTH TWICE DAILY 09/09/21  Yes Larey Dresser, MD  ?gabapentin (NEURONTIN) 100 MG capsule Take 100 mg by mouth 3 (three) times daily.   Yes [provider]  ?lanthanum (FOSRENOL) 500 MG chewable tablet Chew 500 mg by mouth 3 (three) times daily with meals.   Yes [provider]  ?levalbuterol (XOPENEX HFA) 45 MCG/ACT inhaler Inhale 1-2 puffs into the lungs every 4 (four) hours as needed  for wheezing or shortness of breath (coughing fits). 09/30/21  Yes Garnet Sierras, DO  ? ?Past Medical History:  ?Diagnosis Date  ? Biventricular congestive heart failure ([place])   ? Last Echo 11/2019 at [facility] reveals EF 20%  ? Class 3 severe obesity due to excess calories with serious comorbidity and body mass index (BMI) of 50.0 to 59.9 in adult [facility]) 02/26/2020  ? Essential hypertension 02/26/2020  ? GERD without esophagitis 02/26/2020  ? Hidradenitis suppurativa 02/26/2020  ? OSA (obstructive sleep apnea) 02/26/2020  ? Prediabetes 02/26/2020  ? Renal disorder   ? ?Past Surgical History:  ?Procedure Laterality Date  ? ABSCESS DRAINAGE    ? AV FISTULA PLACEMENT Left 08/21/2021  ? Procedure: LEFT ARM ARTERIOVENOUS (AV) FISTULA.;  Surgeon: Serafina Mitchell, MD;  Location: [place];  Service: Vascular;  Laterality: Left;  ? CARDIOVERSION N/A 10/09/2021  ? Procedure: CARDIOVERSION;  Surgeon: Larey Dresser, MD;  Location[facility] ENDOSCOPY;  Service: Cardiovascular;  Laterality: N/A;  ? IR FLUORO GUIDE CV LINE RIGHT  03/10/2021  ? IR FLUORO GUIDE CV LINE RIGHT  04/22/2021  ? IR FLUORO GUIDE CV LINE RIGHT  08/20/2021  ? IR US GUIDE VASC ACCESS RIGHT  03/10/2021  ? IR US GUIDE VASC ACCESS RIGHT  04/22/2021  ? RIGHT HEART CATH N/A 03/06/2021  ? Procedure: RIGHT HEART CATH;  Surgeon: Jolaine Artist, MD;  Location: [place] CV LAB;  Service: Cardiovascular;  Laterality: N/A;  ? RIGHT/LEFT HEART CATH AND CORONARY ANGIOGRAPHY N/A 03/04/2020  ? Procedure: RIGHT/LEFT HEART CATH AND CORONARY ANGIOGRAPHY;  Surgeon: Larey Dresser, MD;  Location: [place] CV LAB;  Service: Cardiovascular;  Laterality: N/A;  ? TEE WITHOUT CARDIOVERSION N/A 05/05/2021  ? Procedure: TRANSESOPHAGEAL ECHOCARDIOGRAM (TEE);  Surgeon: Larey Dresser, MD;  Location: [facility] ENDOSCOPY;  Service: Cardiovascular;  Laterality: N/A;  ? TEMPORARY DIALYSIS CATHETER  03/06/2021  ? Procedure: TEMPORARY DIALYSIS CATHETER;  Surgeon: Jolaine Artist, MD;  Location: [place] CV LAB;  Service: Cardiovascular;;  ? ?Family History  ?Problem Relation Age of Onset  ? Heart disease Mother   ? Hypertension Mother   ? Pulmonary Hypertension Mother   ? Drug abuse Father   ?     died due to Heroin overdose  ? ?Social History  ? ?Socioeconomic History  ? Marital status: Single  ?  Spouse name: Not on file  ? Number of children: Not on file  ? Years of education: Not on file  ? Highest education level: Not on file  ?Occupational History  ? Not on file  ?Tobacco Use  ? Smoking status: Former  ?  Packs/day: 1.00  ?  Types: Cigarettes  ?  Quit date: 2019  ?  Years since quitting: 4.3  ?  Passive exposure: Current  ? Smokeless tobacco: Former  ? Tobacco comments:  ?  quit in 2019  ?Vaping Use  ? Vaping Use: Never used  ?Substance and Sexual Activity  ? Alcohol use: Never  ? Drug use: Never  ? Sexual activity: Not on file  ?Other Topics Concern  ? Not on file  ?Social History Narrative  ? Not on file  ? ?Social Determinants of Health  ? ?Financial Resource Strain: Low Risk   ? Difficulty of Paying Living Expenses: Not very hard  ?Food Insecurity: Food Insecurity Present  ? Worried About Charity fundraiser in the Last Year: Never true  ? Ran Out of Food in the Last Year: Sometimes true  ?Transportation Needs: No Transportation Needs  ? Lack of Transportation (Medical): No  ? Lack of Transportation (Non-Medical): No  ?Physical Activity: Not on file  ?Stress: Not on file  ?Social Connections: Not on file  ?Intimate Partner Violence: Not on file  ? ?ROS-see HPI   + = positive ?Constitutional:    weight loss, night sweats, fevers, chills, fatigue, lassitude. ?HEENT:    headaches, difficulty swallowing, tooth/dental problems, sore throat,  ?     sneezing, itching, ear ache, nasal congestion, post nasal drip, snoring ?CV:    chest pain, orthopnea, PND, swelling in lower extremities, anasarca,         dizziness, palpitations ?Resp:  + shortness of breath with exertion or at rest.   ?              +productive cough,   non-productive cough, coughing up of blood.   ?           change in color of mucus.  wheezing.   ?Skin:    rash or lesions. ?GI:  No-   heartburn, indigestion, abdominal pain, nausea, vomiting, diarrhea,  ?               change in bowel habits, loss of appetite ?GU: dysuria, change in color of urine, no urgency or frequency.   flank pain. ?MS:   joint pain, stiffness, decreased range of motion, back pain. ?Neuro-     nothing  unusual ?Psych:  change in mood or affect.  depression or anxiety.   memory loss. ? ?OBJ- Physical Exam ?General- Alert, Oriented, Affect-appropriate, Distress- none acute, +morbidly obese ?Skin- rash-none, lesions- none, excoriation- none ?Lymphadenopathy- none ?Head- atraumatic ?           Eyes- Gross vision intact, PERRLA, conjunctivae and secretions clear ?           Ears- Hearing, canals-normal ?           Nose- Clear, no-Septal dev, mucus, polyps, erosion, perforation  ?           Throat- Mallampati III-IV, mucosa clear , drainage- none, tonsils- atrophic ?Neck- flexible , trachea midline, no stridor , thyroid nl, carotid no bruit ?Chest - symmetrical excursion , unlabored ?          Heart/CV- RRR , no murmur , no gallop  , no rub, nl s1 s2 ?                          - JVD- none , edema- none, stasis changes- none, varices- none ?          Lung- clear to P&A, wheeze- none, cough- none , dullness-none, rub- none ?          Chest wall-  ?Abd-  ?Br/ Gen/ Rectal- Not done, not indicated ?Extrem- cyanosis- none, clubbing, none, atrophy- none, strength- nl ?Neuro- grossly intact to observation ? ? ?

## 2021-11-14 DIAGNOSIS — T8249XA Other complication of vascular dialysis catheter, initial encounter: Secondary | ICD-10-CM | POA: Diagnosis not present

## 2021-11-14 DIAGNOSIS — N186 End stage renal disease: Secondary | ICD-10-CM | POA: Diagnosis not present

## 2021-11-14 DIAGNOSIS — N2581 Secondary hyperparathyroidism of renal origin: Secondary | ICD-10-CM | POA: Diagnosis not present

## 2021-11-14 DIAGNOSIS — Z992 Dependence on renal dialysis: Secondary | ICD-10-CM | POA: Diagnosis not present

## 2021-11-16 ENCOUNTER — Encounter: Payer: Self-pay | Admitting: Internal Medicine

## 2021-11-16 ENCOUNTER — Ambulatory Visit (INDEPENDENT_AMBULATORY_CARE_PROVIDER_SITE_OTHER): Payer: BC Managed Care – PPO | Admitting: Internal Medicine

## 2021-11-16 VITALS — BP 130/80 | HR 95 | Temp 98.1°F | Ht 72.0 in | Wt >= 6400 oz

## 2021-11-16 DIAGNOSIS — J9611 Chronic respiratory failure with hypoxia: Secondary | ICD-10-CM

## 2021-11-16 DIAGNOSIS — G4733 Obstructive sleep apnea (adult) (pediatric): Secondary | ICD-10-CM

## 2021-11-16 DIAGNOSIS — I5023 Acute on chronic systolic (congestive) heart failure: Secondary | ICD-10-CM | POA: Diagnosis not present

## 2021-11-16 DIAGNOSIS — I5082 Biventricular heart failure: Secondary | ICD-10-CM | POA: Diagnosis not present

## 2021-11-16 DIAGNOSIS — Z91199 Patient's noncompliance with other medical treatment and regimen due to unspecified reason: Secondary | ICD-10-CM | POA: Diagnosis not present

## 2021-11-16 NOTE — Assessment & Plan Note (Signed)
This problem is dominating his life.  He will likely need external support program to make meaningful lifestyle changes if he is to achieve normal body weight. ?

## 2021-11-16 NOTE — Assessment & Plan Note (Signed)
He does benefit from Trilogy NIV and is meeting compliance goals.  He recognizes improved sleep quality and improved daytime alertness.  He respects the importance of safe and responsible driving. 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 ?

## 2021-11-16 NOTE — Assessment & Plan Note (Signed)
Importance of PAP compliance was emphasized ?

## 2021-11-16 NOTE — Patient Instructions (Signed)
As soon as we get download information from [facility] about your PAP machine and oxygen use, we will get your letter. It is important that you use your PAP machine every night. ?

## 2021-11-17 DIAGNOSIS — N2581 Secondary hyperparathyroidism of renal origin: Secondary | ICD-10-CM | POA: Diagnosis not present

## 2021-11-17 DIAGNOSIS — Z992 Dependence on renal dialysis: Secondary | ICD-10-CM | POA: Diagnosis not present

## 2021-11-17 DIAGNOSIS — N186 End stage renal disease: Secondary | ICD-10-CM | POA: Diagnosis not present

## 2021-11-17 DIAGNOSIS — T8249XA Other complication of vascular dialysis catheter, initial encounter: Secondary | ICD-10-CM | POA: Diagnosis not present

## 2021-11-19 ENCOUNTER — Telehealth: Payer: Self-pay | Admitting: *Deleted

## 2021-11-19 DIAGNOSIS — N186 End stage renal disease: Secondary | ICD-10-CM | POA: Diagnosis not present

## 2021-11-19 DIAGNOSIS — T8249XA Other complication of vascular dialysis catheter, initial encounter: Secondary | ICD-10-CM | POA: Diagnosis not present

## 2021-11-19 DIAGNOSIS — Z992 Dependence on renal dialysis: Secondary | ICD-10-CM | POA: Diagnosis not present

## 2021-11-19 DIAGNOSIS — N2581 Secondary hyperparathyroidism of renal origin: Secondary | ICD-10-CM | POA: Diagnosis not present

## 2021-11-19 NOTE — Telephone Encounter (Signed)
Please see phone note started 11/06/21. ?

## 2021-11-19 NOTE — Telephone Encounter (Signed)
Patient called back stating his work will not accept updated letter as it does not include that his doctor's appts and hospitalization were for respiratory disease. It needs to say he is able to return to work without restrictions. It also needs to be signed so he cannot receive over MyChart; he will need to pick it up. Patient is unable to work until letter is corrected. ?

## 2021-11-19 NOTE — Telephone Encounter (Signed)
Call placed to patient. Read updated letter to him over phone. Thinks this is acceptable. He will come today to pick it up. ?

## 2021-11-21 DIAGNOSIS — T8249XA Other complication of vascular dialysis catheter, initial encounter: Secondary | ICD-10-CM | POA: Diagnosis not present

## 2021-11-21 DIAGNOSIS — N2581 Secondary hyperparathyroidism of renal origin: Secondary | ICD-10-CM | POA: Diagnosis not present

## 2021-11-21 DIAGNOSIS — N186 End stage renal disease: Secondary | ICD-10-CM | POA: Diagnosis not present

## 2021-11-21 DIAGNOSIS — Z992 Dependence on renal dialysis: Secondary | ICD-10-CM | POA: Diagnosis not present

## 2021-11-22 DIAGNOSIS — Z992 Dependence on renal dialysis: Secondary | ICD-10-CM | POA: Diagnosis not present

## 2021-11-22 DIAGNOSIS — N186 End stage renal disease: Secondary | ICD-10-CM | POA: Diagnosis not present

## 2021-11-22 DIAGNOSIS — E1122 Type 2 diabetes mellitus with diabetic chronic kidney disease: Secondary | ICD-10-CM | POA: Diagnosis not present

## 2021-11-23 DIAGNOSIS — I5082 Biventricular heart failure: Secondary | ICD-10-CM | POA: Diagnosis not present

## 2021-11-23 DIAGNOSIS — I5023 Acute on chronic systolic (congestive) heart failure: Secondary | ICD-10-CM | POA: Diagnosis not present

## 2021-11-26 DIAGNOSIS — N186 End stage renal disease: Secondary | ICD-10-CM | POA: Diagnosis not present

## 2021-11-26 DIAGNOSIS — T8249XA Other complication of vascular dialysis catheter, initial encounter: Secondary | ICD-10-CM | POA: Diagnosis not present

## 2021-11-26 DIAGNOSIS — Z992 Dependence on renal dialysis: Secondary | ICD-10-CM | POA: Diagnosis not present

## 2021-11-26 DIAGNOSIS — N2581 Secondary hyperparathyroidism of renal origin: Secondary | ICD-10-CM | POA: Diagnosis not present

## 2021-11-28 DIAGNOSIS — Z992 Dependence on renal dialysis: Secondary | ICD-10-CM | POA: Diagnosis not present

## 2021-11-28 DIAGNOSIS — N2581 Secondary hyperparathyroidism of renal origin: Secondary | ICD-10-CM | POA: Diagnosis not present

## 2021-11-28 DIAGNOSIS — N186 End stage renal disease: Secondary | ICD-10-CM | POA: Diagnosis not present

## 2021-11-28 DIAGNOSIS — T8249XA Other complication of vascular dialysis catheter, initial encounter: Secondary | ICD-10-CM | POA: Diagnosis not present

## 2021-12-01 ENCOUNTER — Institutional Professional Consult (permissible substitution): Payer: BC Managed Care – PPO | Admitting: Cardiology

## 2021-12-01 DIAGNOSIS — Z992 Dependence on renal dialysis: Secondary | ICD-10-CM | POA: Diagnosis not present

## 2021-12-01 DIAGNOSIS — T8249XA Other complication of vascular dialysis catheter, initial encounter: Secondary | ICD-10-CM | POA: Diagnosis not present

## 2021-12-01 DIAGNOSIS — N186 End stage renal disease: Secondary | ICD-10-CM | POA: Diagnosis not present

## 2021-12-01 DIAGNOSIS — N2581 Secondary hyperparathyroidism of renal origin: Secondary | ICD-10-CM | POA: Diagnosis not present

## 2021-12-01 NOTE — Progress Notes (Deleted)
Electrophysiology Office Note   Date:  12/01/2021   ID:  Phillip Young, DOB [birth year], MRN [id number]  PCP:  [name], MD  Cardiologist:  [name] Primary Electrophysiologist:  Ayodele Sangalang Meredith Leeds, MD    Chief Complaint: AF, CHF   History of Present Illness: Phillip Young is a 36 y.o. male who is being seen today for the evaluation of AF, CHF at the request of Larey Dresser, MD. Presenting today for electrophysiology evaluation.  He has a history significant for chronic systolic heart failure, hypertension, obstructive sleep apnea, end-stage renal disease, atrial fibrillation.  He was in the hospital 09/29/2021 with atrial fibrillation requiring cardioversion.  He was found to be both tachycardic and bradycardic at the time and thus is not on rate controlling medications.  He is not on optimal medical therapy for his heart failure due to his end-stage renal disease.  Today, he denies*** symptoms of palpitations, chest pain, shortness of breath, orthopnea, PND, lower extremity edema, claudication, dizziness, presyncope, syncope, bleeding, or neurologic sequela. The patient is tolerating medications without difficulties.    Past Medical History:  Diagnosis Date   Biventricular congestive heart failure ([place])    Last Echo 11/2019 at [facility] reveals EF 20%   Class 3 severe obesity due to excess calories with serious comorbidity and body mass index (BMI) of 50.0 to 59.9 in adult [facility]) 02/26/2020   Essential hypertension 02/26/2020   GERD without esophagitis 02/26/2020   Hidradenitis suppurativa 02/26/2020   OSA (obstructive sleep apnea) 02/26/2020   Prediabetes 02/26/2020   Renal disorder    Past Surgical History:  Procedure Laterality Date   ABSCESS DRAINAGE     AV FISTULA PLACEMENT Left 08/21/2021   Procedure: LEFT ARM ARTERIOVENOUS (AV) FISTULA.;  Surgeon: Serafina Mitchell, MD;  Location: [place];  Service: Vascular;  Laterality: Left;   CARDIOVERSION N/A 10/09/2021    Procedure: CARDIOVERSION;  Surgeon: Larey Dresser, MD;  Location: [place];  Service: Cardiovascular;  Laterality: N/A;   IR FLUORO GUIDE CV LINE RIGHT  03/10/2021   IR FLUORO GUIDE CV LINE RIGHT  04/22/2021   IR FLUORO GUIDE CV LINE RIGHT  08/20/2021   IR US GUIDE VASC ACCESS RIGHT  03/10/2021   IR US GUIDE VASC ACCESS RIGHT  04/22/2021   RIGHT HEART CATH N/A 03/06/2021   Procedure: RIGHT HEART CATH;  Surgeon: Jolaine Artist, MD;  Location: [place] CV LAB;  Service: Cardiovascular;  Laterality: N/A;   RIGHT/LEFT HEART CATH AND CORONARY ANGIOGRAPHY N/A 03/04/2020   Procedure: RIGHT/LEFT HEART CATH AND CORONARY ANGIOGRAPHY;  Surgeon: Larey Dresser, MD;  Location: [place] CV LAB;  Service: Cardiovascular;  Laterality: N/A;   TEE WITHOUT CARDIOVERSION N/A 05/05/2021   Procedure: TRANSESOPHAGEAL ECHOCARDIOGRAM (TEE);  Surgeon: Larey Dresser, MD;  Location: [facility] ENDOSCOPY;  Service: Cardiovascular;  Laterality: N/A;   TEMPORARY DIALYSIS CATHETER  03/06/2021   Procedure: TEMPORARY DIALYSIS CATHETER;  Surgeon: Jolaine Artist, MD;  Location: [place] CV LAB;  Service: Cardiovascular;;     Current Outpatient Medications  Medication Sig Dispense Refill   benzonatate (TESSALON) 200 MG capsule Take 1 capsule (200 mg total) by mouth 3 (three) times daily as needed for cough. 30 capsule 0   ELIQUIS 5 MG TABS tablet TAKE 1 TABLET(5 MG) BY MOUTH TWICE DAILY 180 tablet 3   gabapentin (NEURONTIN) 100 MG capsule Take 100 mg by mouth 3 (three) times daily.     lanthanum (FOSRENOL) 500 MG chewable tablet Chew 500  mg by mouth 3 (three) times daily with meals.     levalbuterol (XOPENEX HFA) 45 MCG/ACT inhaler Inhale 1-2 puffs into the lungs every 4 (four) hours as needed for wheezing or shortness of breath (coughing fits). 1 each 1   No current facility-administered medications for this visit.    Allergies:   Coreg [carvedilol] and Heparin   Social History:  The patient  reports  that he quit smoking about 4 years ago. His smoking use included cigarettes. He smoked an average of 1 pack per day. He has been exposed to tobacco smoke. He has quit using smokeless tobacco. He reports that he does not drink alcohol and does not use drugs.   Family History:  The patient's ***family history includes Drug abuse in his father; Heart disease in his mother; Hypertension in his mother; Pulmonary Hypertension in his mother.    ROS:  Please see the history of present illness.   Otherwise, review of systems is positive for none.   All other systems are reviewed and negative.    PHYSICAL EXAM: VS:  There were no vitals taken for this visit. , BMI There is no height or weight on file to calculate BMI. GEN: Well nourished, well developed, in no acute distress  HEENT: normal  Neck: no JVD, carotid bruits, or masses Cardiac: ***RRR; no murmurs, rubs, or gallops,no edema  Respiratory:  clear to auscultation bilaterally, normal work of breathing GI: soft, nontender, nondistended, + BS MS: no deformity or atrophy  Skin: warm and dry, ***device pocket is well healed Neuro:  Strength and sensation are intact Psych: euthymic mood, full affect  EKG:  EKG {ACTION; IS/IS TOI:71245809} ordered today. Personal review of the ekg ordered *** shows ***  ***Device interrogation is reviewed today in detail.  See PaceArt for details.   Recent Labs: 08/21/2021: TSH 3.217 09/02/2021: B Natriuretic Peptide 839.9 09/03/2021: Magnesium 1.8 09/04/2021: Hemoglobin 9.4; Platelets 234 09/15/2021: ALT 24 10/09/2021: BUN 32; Creatinine, Ser 7.44; Potassium 5.9; Sodium 134    Lipid Panel     Component Value Date/Time   TRIG 137 07/29/2020 0140     Wt Readings from Last 3 Encounters:  11/16/21 (!) 409 lb 9.6 oz (185.8 kg)  10/05/21 (!) 411 lb (186.4 kg)  09/30/21 (!) 411 lb (186.4 kg)      Other studies Reviewed: Additional studies/ records that were reviewed today include: TTE 04/18/21  Review of  the above records today demonstrates:   1. Left ventricular ejection fraction, by estimation, is 25 to 30%. The  left ventricle has severely decreased function. The left ventricle  demonstrates global hypokinesis. The left ventricular internal cavity size  was moderately dilated. Left  ventricular diastolic parameters are indeterminate.   2. Right ventricular systolic function is normal. The right ventricular  size is normal. There is moderately elevated pulmonary artery systolic  pressure. The estimated right ventricular systolic pressure is 98.3 mmHg.   3. The mitral valve is normal in structure. Trivial mitral valve  regurgitation. No evidence of mitral stenosis.   4. The aortic valve is normal in structure. Aortic valve regurgitation is  not visualized. No aortic stenosis is present.   5. The inferior vena cava is normal in size with greater than 50%  respiratory variability, suggesting right atrial pressure of 3 mmHg.    ASSESSMENT AND PLAN:  1.  ***    Current medicines are reviewed at length with the patient today.   The patient {ACTIONS; HAS/DOES NOT HAVE:19233} concerns regarding  his medicines.  The following changes were made today:  {NONE DEFAULTED:18576}  Labs/ tests ordered today include: *** No orders of the defined types were placed in this encounter.    Disposition:   FU with Shahram Alexopoulos {gen number [id number]} {Days to years:10300}  Signed, Ailis Rigaud Meredith Leeds, MD  12/01/2021 8:35 AM     [facility] [address] [place] [id number] [phone] (office) [phone] (fax)

## 2021-12-03 DIAGNOSIS — T8249XA Other complication of vascular dialysis catheter, initial encounter: Secondary | ICD-10-CM | POA: Diagnosis not present

## 2021-12-03 DIAGNOSIS — N186 End stage renal disease: Secondary | ICD-10-CM | POA: Diagnosis not present

## 2021-12-03 DIAGNOSIS — N2581 Secondary hyperparathyroidism of renal origin: Secondary | ICD-10-CM | POA: Diagnosis not present

## 2021-12-03 DIAGNOSIS — Z992 Dependence on renal dialysis: Secondary | ICD-10-CM | POA: Diagnosis not present

## 2021-12-05 DIAGNOSIS — T8249XA Other complication of vascular dialysis catheter, initial encounter: Secondary | ICD-10-CM | POA: Diagnosis not present

## 2021-12-05 DIAGNOSIS — Z992 Dependence on renal dialysis: Secondary | ICD-10-CM | POA: Diagnosis not present

## 2021-12-05 DIAGNOSIS — N2581 Secondary hyperparathyroidism of renal origin: Secondary | ICD-10-CM | POA: Diagnosis not present

## 2021-12-05 DIAGNOSIS — J9610 Chronic respiratory failure, unspecified whether with hypoxia or hypercapnia: Secondary | ICD-10-CM | POA: Diagnosis not present

## 2021-12-05 DIAGNOSIS — E662 Morbid (severe) obesity with alveolar hypoventilation: Secondary | ICD-10-CM | POA: Diagnosis not present

## 2021-12-05 DIAGNOSIS — I509 Heart failure, unspecified: Secondary | ICD-10-CM | POA: Diagnosis not present

## 2021-12-05 DIAGNOSIS — I5023 Acute on chronic systolic (congestive) heart failure: Secondary | ICD-10-CM | POA: Diagnosis not present

## 2021-12-05 DIAGNOSIS — N186 End stage renal disease: Secondary | ICD-10-CM | POA: Diagnosis not present

## 2021-12-08 DIAGNOSIS — T8249XA Other complication of vascular dialysis catheter, initial encounter: Secondary | ICD-10-CM | POA: Diagnosis not present

## 2021-12-08 DIAGNOSIS — Z992 Dependence on renal dialysis: Secondary | ICD-10-CM | POA: Diagnosis not present

## 2021-12-08 DIAGNOSIS — N186 End stage renal disease: Secondary | ICD-10-CM | POA: Diagnosis not present

## 2021-12-08 DIAGNOSIS — N2581 Secondary hyperparathyroidism of renal origin: Secondary | ICD-10-CM | POA: Diagnosis not present

## 2021-12-10 DIAGNOSIS — N2581 Secondary hyperparathyroidism of renal origin: Secondary | ICD-10-CM | POA: Diagnosis not present

## 2021-12-10 DIAGNOSIS — T8249XA Other complication of vascular dialysis catheter, initial encounter: Secondary | ICD-10-CM | POA: Diagnosis not present

## 2021-12-10 DIAGNOSIS — N186 End stage renal disease: Secondary | ICD-10-CM | POA: Diagnosis not present

## 2021-12-10 DIAGNOSIS — Z992 Dependence on renal dialysis: Secondary | ICD-10-CM | POA: Diagnosis not present

## 2021-12-13 DIAGNOSIS — I509 Heart failure, unspecified: Secondary | ICD-10-CM | POA: Diagnosis not present

## 2021-12-13 DIAGNOSIS — I5023 Acute on chronic systolic (congestive) heart failure: Secondary | ICD-10-CM | POA: Diagnosis not present

## 2021-12-13 DIAGNOSIS — J9610 Chronic respiratory failure, unspecified whether with hypoxia or hypercapnia: Secondary | ICD-10-CM | POA: Diagnosis not present

## 2021-12-15 DIAGNOSIS — N186 End stage renal disease: Secondary | ICD-10-CM | POA: Diagnosis not present

## 2021-12-15 DIAGNOSIS — N2581 Secondary hyperparathyroidism of renal origin: Secondary | ICD-10-CM | POA: Diagnosis not present

## 2021-12-15 DIAGNOSIS — T8249XA Other complication of vascular dialysis catheter, initial encounter: Secondary | ICD-10-CM | POA: Diagnosis not present

## 2021-12-15 DIAGNOSIS — Z992 Dependence on renal dialysis: Secondary | ICD-10-CM | POA: Diagnosis not present

## 2021-12-16 ENCOUNTER — Ambulatory Visit (HOSPITAL_COMMUNITY)
Admission: RE | Admit: 2021-12-16 | Discharge: 2021-12-16 | Disposition: A | Discharge status: Home / Self Care | Payer: BC Managed Care – PPO | Source: Ambulatory Visit | Attending: Cardiology | Admitting: Cardiology

## 2021-12-16 VITALS — BP 140/98 | Wt >= 6400 oz

## 2021-12-16 DIAGNOSIS — N186 End stage renal disease: Secondary | ICD-10-CM

## 2021-12-16 DIAGNOSIS — I5022 Chronic systolic (congestive) heart failure: Secondary | ICD-10-CM | POA: Diagnosis not present

## 2021-12-16 DIAGNOSIS — I480 Paroxysmal atrial fibrillation: Secondary | ICD-10-CM

## 2021-12-16 DIAGNOSIS — I5082 Biventricular heart failure: Secondary | ICD-10-CM | POA: Diagnosis not present

## 2021-12-16 DIAGNOSIS — I5023 Acute on chronic systolic (congestive) heart failure: Secondary | ICD-10-CM

## 2021-12-16 DIAGNOSIS — I5032 Chronic diastolic (congestive) heart failure: Secondary | ICD-10-CM | POA: Diagnosis not present

## 2021-12-16 MED ORDER — APIXABAN 5 MG PO TABS: 5.0000 mg | ORAL_TABLET | Freq: Two times a day (BID) | ORAL | 3 refills | Status: DC

## 2021-12-16 NOTE — Patient Instructions (Signed)
PLEASE RESTART YOUR ELIQUIS 5MG  TWICE DAILY. ONCE YOUR SURGERY HAS BEEN SCHEDULED THEN STOP FOR 3 DAYS BEFORE YOUR SURGERY AND RESTART IT AFTER SURGERY.  Your physician recommends that you schedule a follow-up appointment in: 4 months.  If you have any questions or concerns before your next appointment please send [place] a message through [place] or call our office at [phone].    TO LEAVE A MESSAGE FOR THE NURSE SELECT OPTION 2, PLEASE LEAVE A MESSAGE INCLUDING: YOUR NAME DATE OF BIRTH CALL BACK NUMBER REASON FOR CALL**this is important as we prioritize the call backs  YOU WILL RECEIVE A CALL BACK THE SAME DAY AS LONG AS YOU CALL BEFORE 4:00 PM  At the [facility], you and your health needs are our priority. As part of our continuing mission to provide you with exceptional heart care, we have created designated Provider Care Teams. These Care Teams include your primary Cardiologist (physician) and Advanced Practice Providers (APPs- Physician Assistants and Nurse Practitioners) who all work together to provide you with the care you need, when you need it.   You may see any of the following providers on your designated Care Team at your next follow up: Dr Glori Bickers Dr Haynes Kerns, NP Lyda Jester, [facility] [place], [place] Audry Riles, PharmD   Please be sure to bring in all your medications bottles to every appointment.

## 2021-12-17 NOTE — Progress Notes (Signed)
PCP: [name], MD Cardiology: Dr. [name]  HPI: 36 y.o. with history of cardiomyopathy and obesity.  He has been known to have a cardiomyopathy (biventricular failure) since back in 2019, echo in 2019 with EF 20% [facility]).  He has been followed in the [facility] system.  Admitted to [facility] in 4/58 with A/C Systolic HF. Diuresed with IV lasix and transitioned to torsemide. Placed on milrinone with low output and had RHC/LHC. Cath showed no CAD and volume overload.  Blood gas revealed a CO2 level elevated to 78. CPAP was initiated.   He was unable to tolerate Coreg due to wheezing and diarrhea.   Echo in 11/21 showed EF 20-25%, RV poorly visualized.  He was seen by EP, thought to be too large for cardiac MRI.   Patient was admitted in 7/22 with cardiogenic shock and AKI.  He ended up on CVVH.  He had a prolonged hospitalization for volume removal and titration off inotropes/pressors.  Eventually he was able to transition to [facility] and was discharged. Hospitalization was complicated by AF with RVR as well as HIT.  He was cardioverted several times.  Echo in 7/22 showed EF < 20%, severe LV enlargement, normal RV.   Admitted 9/22 with MSSA bacteremia due to infected HD catheter. TEE showed EF 20%, severely decreased LV with global HK, moderately reduced RV, no vegetation or thrombus noted. Hospitalization complicated by septic arthritis and muscle abscess. IV abx per ID.     Admitted 1/23 at [facility] with a/c CHF.  Re-admitted shortly thereafter after presenting to HD tachycardic and tachypneic. HD staff unable to access his dialysis catheter. He underwent a tunnel catheter exchange and taken for AV fistula creation.    Admitted 2/23 for acute on chronic hypoxic respiratory failure and a/c CHF in setting of incomplete HD sessions and dietary indiscretion/noncompliance with fluid restriction. He received serial HD sessions and was seen by PMT for [place], he remained full code.    Patient had recurrent AF with DCCV in 3/23. Amiodarone has been stopped due to elevated LFTs.   Patient returns for followup of CHF, ESRD, and PAF.  He is using CPAP.  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 He needs AV fistula placement and was told told that he would have to stop apixaban.  He stopped apixaban but does not yet have a surgical date.  Weight is down 12 lbs. BP is mildly elevated.  No dyspnea walking on flat ground.  No chest pain.   Labs (10/21): K 3.7, creatinine 1.07 Labs (2/23): AST 25, ALT 113  ECG (personally reviewed): NSR, nonspecific T wave flattening  PMH: 1. Hidradenitis suppuritiva 2. GERD 3. OHS/OSA  4. Diabetes 5. Morbid obesity 6. Fe deficiency anemia 7. Chronic systolic CHF: Nonischemic cardiomyopathy.  No history of ETOH or drugs.  +ANA.  No strong FH of cardiomyopathy.  - Echo (10/19, [facility]): EF 20%.  - Echo (5/21, [facility]): EF 20% - Echo (8/21): EF < 20%, moderate LV dilation, severely decreased RV function with severe RV dilation, severe biatrial enlargement, mild MR.  - LHC/RHC (8/21): No significant coronary disease; mean RA 19, PA 71/20 mean 39, mean PCWP 23, CI 2.1, PVR 2.5 WU.  - Echo (11/21): EF 20-25%, RV poorly visualized.  - Echo (7/22): EF < 20%, severely dilated LV.  - TEE (10/22): EF 20%, moderate LV dilation, mild LVH, moderately reduced RV systolic function 8. Prior smoker.  9. ESRD 10. Atrial fibrillation: Paroxysmal.  11. History of HIT.   ROS: All systems negative except as listed in HPI, PMH and Problem List.  Social History   Socioeconomic History   Marital status: Single    Spouse name: Not on file   Number of children: Not on file   Years of education: Not on file   Highest education level: Not on file  Occupational History   Not on file  Tobacco Use   Smoking status: Former    Packs/day: 1.00    Types: Cigarettes    Quit date: 2019    Years since quitting: 4.3    Passive exposure: Current    Smokeless tobacco: Former   Tobacco comments:    quit in 2019  Vaping Use   Vaping Use: Never used  Substance and Sexual Activity   Alcohol use: Never   Drug use: Never   Sexual activity: Not on file  Other Topics Concern   Not on file  Social History Narrative   Not on file   Social Determinants of Health   Financial Resource Strain: Low Risk    Difficulty of Paying Living Expenses: Not very hard  Food Insecurity: Food Insecurity Present   Worried About [place] in the Last Year: Never true   Ran Out of Food in the Last Year: Sometimes true  Transportation Needs: No Transportation Needs   Lack of Transportation (Medical): No   Lack of Transportation (Non-Medical): No  Physical Activity: Not on file  Stress: Not on file  Social Connections: Not on file  Intimate Partner Violence: Not on file   Family History  Problem Relation Age of Onset   Heart disease Mother    Hypertension Mother    Pulmonary Hypertension Mother    Drug abuse Father        died due to Heroin overdose    Past Medical History:  Diagnosis Date   Biventricular congestive heart failure ([place])    Last Echo 11/2019 at [facility] reveals EF 20%   Class 3 severe obesity due to excess calories with serious comorbidity and body mass index (BMI) of 50.0 to 59.9 in adult [facility]) 02/26/2020   Essential hypertension 02/26/2020   GERD without esophagitis 02/26/2020   Hidradenitis suppurativa 02/26/2020   OSA (obstructive sleep apnea) 02/26/2020   Prediabetes 02/26/2020   Renal disorder     Current Outpatient Medications  Medication Sig Dispense Refill   benzonatate (TESSALON) 200 MG capsule Take 1 capsule (200 mg total) by mouth 3 (three) times daily as needed for cough. 30 capsule 0   apixaban (ELIQUIS) 5 MG TABS tablet Take 1 tablet (5 mg total) by mouth 2 (two) times daily. 180 tablet 3   No current facility-administered medications for this encounter.    Vitals:   12/16/21 1051  BP: (!)  140/98  Weight: (!) 184.3 kg (406 lb 6.4 oz)   Wt Readings from Last 3 Encounters:  12/16/21 (!) 184.3 kg (406 lb 6.4 oz)  11/16/21 (!) 185.8 kg (409 lb 9.6 oz)  10/05/21 (!) 186.4 kg (411 lb)    PHYSICAL EXAM: General: NAD, obses Neck: No JVD, no thyromegaly or thyroid nodule.  Lungs: Clear to auscultation bilaterally with normal respiratory effort. CV: Nondisplaced PMI.  Heart regular S1/S2, no S3/S4, no murmur.  No peripheral edema.  No carotid bruit.  Normal pedal pulses.  Abdomen: Soft, nontender, no hepatosplenomegaly, no distention.  Skin: Intact without lesions or rashes.  Neurologic: Alert and oriented x 3.  Psych:  Normal affect. Extremities: No clubbing or cyanosis.  HEENT: Normal.   ASSESSMENT & PLAN: 1. Chronic Biventricular Heart Failure:  Known NICM since 2019, previously followed by [facility].  Echo 8/21 EF < 20%, moderate LV dilation, severely decreased RV function, severe biatrial enlargement, mild MR. No history of ETOH/drugs.  No FH of cardiomyopathy (mother with [place]).  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  He is too large for cardiac MRI. RHC/LHC on 03/04/20 with no CAD low CI at 2.1. Evaluated by EP but not a candidate for ICD given BMI.  Echo in 7/22 with EF < 20%, RV poorly visualized.  Developed cardiogenic shock 7/22 admission requiring pressors/inotropes and had AKI progressing to ESRD.  He titrated off inotropes/pressors.  He has also been able to stop midodrine.  TEE in 10/22 showed EF 20%, moderate LV dilation, mild LVH, moderately reduced RV systolic function.  He has been unable to tolerate GDMT due to low BP with HD.  Not volume overloaded on exam.  - Continue HD for volume control, he is tolerating.  2. Atrial fibrillation: Paroxysmal.  DCCV several times in 8/22 admission.  DCCV in 3/23.  Off amiodarone due to elevated LFTs.  He is in NSR today.   - Restart Eliquis 5 mg bid, hold 2 days prior to fistula  placement.   3. ESRD: So far, tolerating HD and getting fluid off effectively.    4. OHS/OSA: Using 2 L home oxygen and CPAP at night.    5. Super Morbid Obesity: Insurance would not cover semaglutide.    Followup in 4 months with APP.   [name] 12/17/2021

## 2021-12-19 DIAGNOSIS — Z992 Dependence on renal dialysis: Secondary | ICD-10-CM | POA: Diagnosis not present

## 2021-12-19 DIAGNOSIS — N2581 Secondary hyperparathyroidism of renal origin: Secondary | ICD-10-CM | POA: Diagnosis not present

## 2021-12-19 DIAGNOSIS — T8249XA Other complication of vascular dialysis catheter, initial encounter: Secondary | ICD-10-CM | POA: Diagnosis not present

## 2021-12-19 DIAGNOSIS — N186 End stage renal disease: Secondary | ICD-10-CM | POA: Diagnosis not present

## 2021-12-22 DIAGNOSIS — N186 End stage renal disease: Secondary | ICD-10-CM | POA: Diagnosis not present

## 2021-12-22 DIAGNOSIS — Z992 Dependence on renal dialysis: Secondary | ICD-10-CM | POA: Diagnosis not present

## 2021-12-22 DIAGNOSIS — T8249XA Other complication of vascular dialysis catheter, initial encounter: Secondary | ICD-10-CM | POA: Diagnosis not present

## 2021-12-22 DIAGNOSIS — N2581 Secondary hyperparathyroidism of renal origin: Secondary | ICD-10-CM | POA: Diagnosis not present

## 2021-12-23 DIAGNOSIS — E1122 Type 2 diabetes mellitus with diabetic chronic kidney disease: Secondary | ICD-10-CM | POA: Diagnosis not present

## 2021-12-23 DIAGNOSIS — N186 End stage renal disease: Secondary | ICD-10-CM | POA: Diagnosis not present

## 2021-12-23 DIAGNOSIS — Z992 Dependence on renal dialysis: Secondary | ICD-10-CM | POA: Diagnosis not present

## 2021-12-24 DIAGNOSIS — I5023 Acute on chronic systolic (congestive) heart failure: Secondary | ICD-10-CM | POA: Diagnosis not present

## 2021-12-24 DIAGNOSIS — I5082 Biventricular heart failure: Secondary | ICD-10-CM | POA: Diagnosis not present

## 2021-12-26 DIAGNOSIS — Z992 Dependence on renal dialysis: Secondary | ICD-10-CM | POA: Diagnosis not present

## 2021-12-26 DIAGNOSIS — N2581 Secondary hyperparathyroidism of renal origin: Secondary | ICD-10-CM | POA: Diagnosis not present

## 2021-12-26 DIAGNOSIS — N186 End stage renal disease: Secondary | ICD-10-CM | POA: Diagnosis not present

## 2021-12-26 DIAGNOSIS — T8249XA Other complication of vascular dialysis catheter, initial encounter: Secondary | ICD-10-CM | POA: Diagnosis not present

## 2021-12-29 DIAGNOSIS — N2581 Secondary hyperparathyroidism of renal origin: Secondary | ICD-10-CM | POA: Diagnosis not present

## 2021-12-29 DIAGNOSIS — T8249XA Other complication of vascular dialysis catheter, initial encounter: Secondary | ICD-10-CM | POA: Diagnosis not present

## 2021-12-29 DIAGNOSIS — N186 End stage renal disease: Secondary | ICD-10-CM | POA: Diagnosis not present

## 2021-12-29 DIAGNOSIS — Z992 Dependence on renal dialysis: Secondary | ICD-10-CM | POA: Diagnosis not present

## 2021-12-31 DIAGNOSIS — N2581 Secondary hyperparathyroidism of renal origin: Secondary | ICD-10-CM | POA: Diagnosis not present

## 2021-12-31 DIAGNOSIS — T8249XA Other complication of vascular dialysis catheter, initial encounter: Secondary | ICD-10-CM | POA: Diagnosis not present

## 2021-12-31 DIAGNOSIS — Z992 Dependence on renal dialysis: Secondary | ICD-10-CM | POA: Diagnosis not present

## 2021-12-31 DIAGNOSIS — N186 End stage renal disease: Secondary | ICD-10-CM | POA: Diagnosis not present

## 2022-01-02 DIAGNOSIS — N186 End stage renal disease: Secondary | ICD-10-CM | POA: Diagnosis not present

## 2022-01-02 DIAGNOSIS — N2581 Secondary hyperparathyroidism of renal origin: Secondary | ICD-10-CM | POA: Diagnosis not present

## 2022-01-02 DIAGNOSIS — T8249XA Other complication of vascular dialysis catheter, initial encounter: Secondary | ICD-10-CM | POA: Diagnosis not present

## 2022-01-02 DIAGNOSIS — Z992 Dependence on renal dialysis: Secondary | ICD-10-CM | POA: Diagnosis not present

## 2022-01-05 DIAGNOSIS — J9610 Chronic respiratory failure, unspecified whether with hypoxia or hypercapnia: Secondary | ICD-10-CM | POA: Diagnosis not present

## 2022-01-05 DIAGNOSIS — E662 Morbid (severe) obesity with alveolar hypoventilation: Secondary | ICD-10-CM | POA: Diagnosis not present

## 2022-01-05 DIAGNOSIS — T8249XA Other complication of vascular dialysis catheter, initial encounter: Secondary | ICD-10-CM | POA: Diagnosis not present

## 2022-01-05 DIAGNOSIS — Z992 Dependence on renal dialysis: Secondary | ICD-10-CM | POA: Diagnosis not present

## 2022-01-05 DIAGNOSIS — N2581 Secondary hyperparathyroidism of renal origin: Secondary | ICD-10-CM | POA: Diagnosis not present

## 2022-01-05 DIAGNOSIS — I5023 Acute on chronic systolic (congestive) heart failure: Secondary | ICD-10-CM | POA: Diagnosis not present

## 2022-01-05 DIAGNOSIS — I509 Heart failure, unspecified: Secondary | ICD-10-CM | POA: Diagnosis not present

## 2022-01-05 DIAGNOSIS — N186 End stage renal disease: Secondary | ICD-10-CM | POA: Diagnosis not present

## 2022-01-07 DIAGNOSIS — N2581 Secondary hyperparathyroidism of renal origin: Secondary | ICD-10-CM | POA: Diagnosis not present

## 2022-01-07 DIAGNOSIS — Z992 Dependence on renal dialysis: Secondary | ICD-10-CM | POA: Diagnosis not present

## 2022-01-07 DIAGNOSIS — T8249XA Other complication of vascular dialysis catheter, initial encounter: Secondary | ICD-10-CM | POA: Diagnosis not present

## 2022-01-07 DIAGNOSIS — N186 End stage renal disease: Secondary | ICD-10-CM | POA: Diagnosis not present

## 2022-01-09 DIAGNOSIS — N2581 Secondary hyperparathyroidism of renal origin: Secondary | ICD-10-CM | POA: Diagnosis not present

## 2022-01-09 DIAGNOSIS — N186 End stage renal disease: Secondary | ICD-10-CM | POA: Diagnosis not present

## 2022-01-09 DIAGNOSIS — Z992 Dependence on renal dialysis: Secondary | ICD-10-CM | POA: Diagnosis not present

## 2022-01-09 DIAGNOSIS — T8249XA Other complication of vascular dialysis catheter, initial encounter: Secondary | ICD-10-CM | POA: Diagnosis not present

## 2022-01-12 ENCOUNTER — Institutional Professional Consult (permissible substitution): Payer: BC Managed Care – PPO | Admitting: Cardiology

## 2022-01-12 DIAGNOSIS — T8249XA Other complication of vascular dialysis catheter, initial encounter: Secondary | ICD-10-CM | POA: Diagnosis not present

## 2022-01-12 DIAGNOSIS — N186 End stage renal disease: Secondary | ICD-10-CM | POA: Diagnosis not present

## 2022-01-12 DIAGNOSIS — N2581 Secondary hyperparathyroidism of renal origin: Secondary | ICD-10-CM | POA: Diagnosis not present

## 2022-01-12 DIAGNOSIS — Z992 Dependence on renal dialysis: Secondary | ICD-10-CM | POA: Diagnosis not present

## 2022-01-12 NOTE — Progress Notes (Deleted)
Electrophysiology Office Note   Date:  01/12/2022   ID:  Phillip Young, DOB [birth year], MRN [id number]  PCP:  [name], MD  Cardiologist:  [name] Primary Electrophysiologist:  Amyla Heffner Meredith Leeds, MD    Chief Complaint: CHF   History of Present Illness: Phillip Young is a 36 y.o. male who is being seen today for the evaluation of CHF at the request of Larey Dresser, MD. Presenting today for electrophysiology evaluation.  He has a history of morbid obesity, atrial fibrillation, ESRD on dialysis and cardiomyopathy.  His cardiomyopathy dates back to 2019.  He was initially followed in the [facility] system.  He was admitted to [facility] in 2021 with acute heart failure.  He was started on milrinone at the time.  Catheterization showed no coronary artery disease.  He has had multiple admissions to the hospital with cardiogenic shock.  He was also admitted with bacteremia due to infected dialysis catheter.  He also has atrial fibrillation.  He had a cardioversion 10/15/2021.  Amiodarone was stopped at that time due to elevated LFTs.  Today, he denies*** symptoms of palpitations, chest pain, shortness of breath, orthopnea, PND, lower extremity edema, claudication, dizziness, presyncope, syncope, bleeding, or neurologic sequela. The patient is tolerating medications without difficulties.    Past Medical History:  Diagnosis Date   Biventricular congestive heart failure ([place])    Last Echo 11/2019 at [facility] reveals EF 20%   Class 3 severe obesity due to excess calories with serious comorbidity and body mass index (BMI) of 50.0 to 59.9 in adult [facility]) 02/26/2020   Essential hypertension 02/26/2020   GERD without esophagitis 02/26/2020   Hidradenitis suppurativa 02/26/2020   OSA (obstructive sleep apnea) 02/26/2020   Prediabetes 02/26/2020   Renal disorder    Past Surgical History:  Procedure Laterality Date   ABSCESS DRAINAGE     AV FISTULA PLACEMENT Left  08/21/2021   Procedure: LEFT ARM ARTERIOVENOUS (AV) FISTULA.;  Surgeon: Serafina Mitchell, MD;  Location: [place];  Service: Vascular;  Laterality: Left;   CARDIOVERSION N/A 10/09/2021   Procedure: CARDIOVERSION;  Surgeon: Larey Dresser, MD;  Location: [place];  Service: Cardiovascular;  Laterality: N/A;   IR FLUORO GUIDE CV LINE RIGHT  03/10/2021   IR FLUORO GUIDE CV LINE RIGHT  04/22/2021   IR FLUORO GUIDE CV LINE RIGHT  08/20/2021   IR US GUIDE VASC ACCESS RIGHT  03/10/2021   IR US GUIDE VASC ACCESS RIGHT  04/22/2021   RIGHT HEART CATH N/A 03/06/2021   Procedure: RIGHT HEART CATH;  Surgeon: Jolaine Artist, MD;  Location: [place] CV [facility];  Service: Cardiovascular;  Laterality: N/A;   RIGHT/LEFT HEART CATH AND CORONARY ANGIOGRAPHY N/A 03/04/2020   Procedure: RIGHT/LEFT HEART CATH AND CORONARY ANGIOGRAPHY;  Surgeon: Larey Dresser, MD;  Location: [place] [facility];  Service: Cardiovascular;  Laterality: N/A;   TEE WITHOUT CARDIOVERSION N/A 05/05/2021   Procedure: TRANSESOPHAGEAL ECHOCARDIOGRAM (TEE);  Surgeon: Larey Dresser, MD;  Location: [facility] ENDOSCOPY;  Service: Cardiovascular;  Laterality: N/A;   TEMPORARY DIALYSIS CATHETER  03/06/2021   Procedure: TEMPORARY DIALYSIS CATHETER;  Surgeon: Jolaine Artist, MD;  Location: [place] CV LAB;  Service: Cardiovascular;;     Current Outpatient Medications  Medication Sig Dispense Refill   apixaban (ELIQUIS) 5 MG TABS tablet Take 1 tablet (5 mg total) by mouth 2 (two) times daily. 180 tablet 3   benzonatate (TESSALON) 200 MG capsule Take 1 capsule (200 mg total)  by mouth 3 (three) times daily as needed for cough. 30 capsule 0   No current facility-administered medications for this visit.    Allergies:   Coreg [carvedilol] and Heparin   Social History:  The patient  reports that he quit smoking about 4 years ago. His smoking use included cigarettes. He smoked an average of 1 pack per day. He has been exposed to tobacco smoke. He  has quit using smokeless tobacco. He reports that he does not drink alcohol and does not use drugs.   Family History:  The patient's family history includes Drug abuse in his father; Heart disease in his mother; Hypertension in his mother; Pulmonary Hypertension in his mother.    ROS:  Please see the history of present illness.   Otherwise, review of systems is positive for none.   All other systems are reviewed and negative.    PHYSICAL EXAM: VS:  There were no vitals taken for this visit. , BMI There is no height or weight on file to calculate BMI. GEN: Well nourished, well developed, in no acute distress  HEENT: normal  Neck: no JVD, carotid bruits, or masses Cardiac: ***RRR; no murmurs, rubs, or gallops,no edema  Respiratory:  clear to auscultation bilaterally, normal work of breathing GI: soft, nontender, nondistended, + BS MS: no deformity or atrophy  Skin: warm and dry Neuro:  Strength and sensation are intact Psych: euthymic mood, full affect  EKG:  EKG {ACTION; IS/IS IWP:[id number]} ordered today. Personal review of the ekg ordered *** shows ***  Recent Labs: 08/21/2021: TSH 3.217 09/02/2021: B Natriuretic Peptide 839.9 09/03/2021: Magnesium 1.8 09/04/2021: Hemoglobin 9.4; Platelets 234 09/15/2021: ALT 24 10/09/2021: BUN 32; Creatinine, Ser 7.44; Potassium 5.9; Sodium 134    Lipid Panel     Component Value Date/Time   TRIG 137 07/29/2020 0140     Wt Readings from Last 3 Encounters:  12/16/21 (!) 406 lb 6.4 oz (184.3 kg)  11/16/21 (!) 409 lb 9.6 oz (185.8 kg)  10/05/21 (!) 411 lb (186.4 kg)      Other studies Reviewed: Additional studies/ records that were reviewed today include: TEE 06/17/21  Review of the above records today demonstrates:   1. Left ventricular ejection fraction, by estimation, is 20%. The left  ventricle has severely decreased function. The left ventricle demonstrates  global hypokinesis. The left ventricular internal cavity size was  moderately  dilated. There is mild left  ventricular hypertrophy.   2. Right ventricular systolic function is moderately reduced. The right  ventricular size is mildly enlarged.   3. Left atrial size was moderately dilated. No left atrial/left atrial  appendage thrombus was detected.   4. No PFO/ASD by color doppler.   5. HD catheter in SVC, no vegetation noted.   6. Mild TR, peak RV-RA gradient 43 mmHg. No TV vegetation.   7. No PV vegetation.   8. No AoV vegetation. The aortic valve is tricuspid. Aortic valve  regurgitation is not visualized. No aortic stenosis is present.   9. No MV vegetation. The mitral valve is normal in structure. Mild mitral  valve regurgitation. No evidence of mitral stenosis.  10. No evidence for endocarditis.    ASSESSMENT AND PLAN:  1.  Chronic systolic heart failure due to nonischemic cardiomyopathy: Ejection fraction less than 20%.  Has been unable to tolerate optimal medical therapy.***  2.  Paroxysmal atrial fibrillation: Status post cardioversion, most recently March 2023.  Off amiodarone due to elevated LFTs.  Continue Eliquis 5 mg twice  daily.  CHA2DS2-VASc of 2.  3.  End-stage renal disease: Tolerating dialysis.  Plan for fistula placement.  4.  Obstructive sleep apnea: CPAP compliance encouraged  5.  Super morbid obesity: Diet and exercise encouraged There is no height or weight on file to calculate BMI.     Current medicines are reviewed at length with the patient today.   The patient {ACTIONS; HAS/DOES NOT HAVE:19233} concerns regarding his medicines.  The following changes were made today:  {NONE DEFAULTED:18576}  Labs/ tests ordered today include: *** No orders of the defined types were placed in this encounter.    Disposition:   FU with Phillip Young {gen number [id number]} {Days to years:10300}  Signed, Lamya Lausch Meredith Leeds, MD  01/12/2022 10:43 AM     [facility] [address] [place] [address] [phone]  (office) [phone] (fax)

## 2022-01-13 DIAGNOSIS — J9610 Chronic respiratory failure, unspecified whether with hypoxia or hypercapnia: Secondary | ICD-10-CM | POA: Diagnosis not present

## 2022-01-13 DIAGNOSIS — I509 Heart failure, unspecified: Secondary | ICD-10-CM | POA: Diagnosis not present

## 2022-01-13 DIAGNOSIS — I5023 Acute on chronic systolic (congestive) heart failure: Secondary | ICD-10-CM | POA: Diagnosis not present

## 2022-01-15 ENCOUNTER — Telehealth: Payer: Self-pay | Admitting: Internal Medicine

## 2022-01-16 DIAGNOSIS — I5082 Biventricular heart failure: Secondary | ICD-10-CM | POA: Diagnosis not present

## 2022-01-16 DIAGNOSIS — Z992 Dependence on renal dialysis: Secondary | ICD-10-CM | POA: Diagnosis not present

## 2022-01-16 DIAGNOSIS — I5023 Acute on chronic systolic (congestive) heart failure: Secondary | ICD-10-CM | POA: Diagnosis not present

## 2022-01-16 DIAGNOSIS — N186 End stage renal disease: Secondary | ICD-10-CM | POA: Diagnosis not present

## 2022-01-16 DIAGNOSIS — N2581 Secondary hyperparathyroidism of renal origin: Secondary | ICD-10-CM | POA: Diagnosis not present

## 2022-01-16 DIAGNOSIS — T8249XA Other complication of vascular dialysis catheter, initial encounter: Secondary | ICD-10-CM | POA: Diagnosis not present

## 2022-01-19 DIAGNOSIS — N2581 Secondary hyperparathyroidism of renal origin: Secondary | ICD-10-CM | POA: Diagnosis not present

## 2022-01-19 DIAGNOSIS — T8249XA Other complication of vascular dialysis catheter, initial encounter: Secondary | ICD-10-CM | POA: Diagnosis not present

## 2022-01-19 DIAGNOSIS — Z992 Dependence on renal dialysis: Secondary | ICD-10-CM | POA: Diagnosis not present

## 2022-01-19 DIAGNOSIS — N186 End stage renal disease: Secondary | ICD-10-CM | POA: Diagnosis not present

## 2022-01-20 NOTE — Telephone Encounter (Signed)
Patient checking on message sent for letter. Patient phone number is [phone].

## 2022-01-20 NOTE — Telephone Encounter (Signed)
Called and spoke with [employer] to see if they would send over a 90 day download so that we can provide patient with letter for work. Will wait for fax

## 2022-01-20 NOTE — Telephone Encounter (Signed)
Fax was received and given to Dr. Annamaria Boots

## 2022-01-20 NOTE — Telephone Encounter (Signed)
We have been waiting for a download of his Trilogy machine from [place] so I can write the letter he is requesting. Please check with [employer] on this.  Thanks

## 2022-01-21 DIAGNOSIS — T8249XA Other complication of vascular dialysis catheter, initial encounter: Secondary | ICD-10-CM | POA: Diagnosis not present

## 2022-01-21 DIAGNOSIS — N186 End stage renal disease: Secondary | ICD-10-CM | POA: Diagnosis not present

## 2022-01-21 DIAGNOSIS — N2581 Secondary hyperparathyroidism of renal origin: Secondary | ICD-10-CM | POA: Diagnosis not present

## 2022-01-21 DIAGNOSIS — Z992 Dependence on renal dialysis: Secondary | ICD-10-CM | POA: Diagnosis not present

## 2022-01-22 ENCOUNTER — Encounter: Payer: Self-pay | Admitting: Internal Medicine

## 2022-01-22 DIAGNOSIS — N186 End stage renal disease: Secondary | ICD-10-CM | POA: Diagnosis not present

## 2022-01-22 DIAGNOSIS — E1122 Type 2 diabetes mellitus with diabetic chronic kidney disease: Secondary | ICD-10-CM | POA: Diagnosis not present

## 2022-01-22 DIAGNOSIS — Z992 Dependence on renal dialysis: Secondary | ICD-10-CM | POA: Diagnosis not present

## 2022-01-22 NOTE — Telephone Encounter (Signed)
ATC patient to let him know that letter is ready, left message for him to call back and let me know if it wants to be picked up or mailed to him.

## 2022-01-22 NOTE — Telephone Encounter (Signed)
Letter completed as requested.

## 2022-01-23 DIAGNOSIS — T8249XA Other complication of vascular dialysis catheter, initial encounter: Secondary | ICD-10-CM | POA: Diagnosis not present

## 2022-01-23 DIAGNOSIS — Z992 Dependence on renal dialysis: Secondary | ICD-10-CM | POA: Diagnosis not present

## 2022-01-23 DIAGNOSIS — I5082 Biventricular heart failure: Secondary | ICD-10-CM | POA: Diagnosis not present

## 2022-01-23 DIAGNOSIS — N2581 Secondary hyperparathyroidism of renal origin: Secondary | ICD-10-CM | POA: Diagnosis not present

## 2022-01-23 DIAGNOSIS — N186 End stage renal disease: Secondary | ICD-10-CM | POA: Diagnosis not present

## 2022-01-23 DIAGNOSIS — I5023 Acute on chronic systolic (congestive) heart failure: Secondary | ICD-10-CM | POA: Diagnosis not present

## 2022-01-25 NOTE — Telephone Encounter (Signed)
Called and spoke with patient to let him know that letter is ready and asked if he wanted to pick it up. Patient said he would come pick it up. Advised him it would be upfront. Nothing further needed at this time.

## 2022-01-26 DIAGNOSIS — N2581 Secondary hyperparathyroidism of renal origin: Secondary | ICD-10-CM | POA: Diagnosis not present

## 2022-01-26 DIAGNOSIS — T8249XA Other complication of vascular dialysis catheter, initial encounter: Secondary | ICD-10-CM | POA: Diagnosis not present

## 2022-01-26 DIAGNOSIS — Z992 Dependence on renal dialysis: Secondary | ICD-10-CM | POA: Diagnosis not present

## 2022-01-26 DIAGNOSIS — N186 End stage renal disease: Secondary | ICD-10-CM | POA: Diagnosis not present

## 2022-01-28 DIAGNOSIS — N186 End stage renal disease: Secondary | ICD-10-CM | POA: Diagnosis not present

## 2022-01-28 DIAGNOSIS — Z992 Dependence on renal dialysis: Secondary | ICD-10-CM | POA: Diagnosis not present

## 2022-01-28 DIAGNOSIS — T8249XA Other complication of vascular dialysis catheter, initial encounter: Secondary | ICD-10-CM | POA: Diagnosis not present

## 2022-01-28 DIAGNOSIS — N2581 Secondary hyperparathyroidism of renal origin: Secondary | ICD-10-CM | POA: Diagnosis not present

## 2022-01-30 DIAGNOSIS — N2581 Secondary hyperparathyroidism of renal origin: Secondary | ICD-10-CM | POA: Diagnosis not present

## 2022-01-30 DIAGNOSIS — N186 End stage renal disease: Secondary | ICD-10-CM | POA: Diagnosis not present

## 2022-01-30 DIAGNOSIS — T8249XA Other complication of vascular dialysis catheter, initial encounter: Secondary | ICD-10-CM | POA: Diagnosis not present

## 2022-01-30 DIAGNOSIS — Z992 Dependence on renal dialysis: Secondary | ICD-10-CM | POA: Diagnosis not present

## 2022-02-02 DIAGNOSIS — Z992 Dependence on renal dialysis: Secondary | ICD-10-CM | POA: Diagnosis not present

## 2022-02-02 DIAGNOSIS — N2581 Secondary hyperparathyroidism of renal origin: Secondary | ICD-10-CM | POA: Diagnosis not present

## 2022-02-02 DIAGNOSIS — T8249XA Other complication of vascular dialysis catheter, initial encounter: Secondary | ICD-10-CM | POA: Diagnosis not present

## 2022-02-02 DIAGNOSIS — N186 End stage renal disease: Secondary | ICD-10-CM | POA: Diagnosis not present

## 2022-02-04 DIAGNOSIS — J9610 Chronic respiratory failure, unspecified whether with hypoxia or hypercapnia: Secondary | ICD-10-CM | POA: Diagnosis not present

## 2022-02-04 DIAGNOSIS — E662 Morbid (severe) obesity with alveolar hypoventilation: Secondary | ICD-10-CM | POA: Diagnosis not present

## 2022-02-04 DIAGNOSIS — I509 Heart failure, unspecified: Secondary | ICD-10-CM | POA: Diagnosis not present

## 2022-02-04 DIAGNOSIS — I5023 Acute on chronic systolic (congestive) heart failure: Secondary | ICD-10-CM | POA: Diagnosis not present

## 2022-02-06 DIAGNOSIS — T8249XA Other complication of vascular dialysis catheter, initial encounter: Secondary | ICD-10-CM | POA: Diagnosis not present

## 2022-02-06 DIAGNOSIS — Z992 Dependence on renal dialysis: Secondary | ICD-10-CM | POA: Diagnosis not present

## 2022-02-06 DIAGNOSIS — N2581 Secondary hyperparathyroidism of renal origin: Secondary | ICD-10-CM | POA: Diagnosis not present

## 2022-02-06 DIAGNOSIS — N186 End stage renal disease: Secondary | ICD-10-CM | POA: Diagnosis not present

## 2022-02-11 DIAGNOSIS — N2581 Secondary hyperparathyroidism of renal origin: Secondary | ICD-10-CM | POA: Diagnosis not present

## 2022-02-11 DIAGNOSIS — T8249XA Other complication of vascular dialysis catheter, initial encounter: Secondary | ICD-10-CM | POA: Diagnosis not present

## 2022-02-11 DIAGNOSIS — Z992 Dependence on renal dialysis: Secondary | ICD-10-CM | POA: Diagnosis not present

## 2022-02-11 DIAGNOSIS — N186 End stage renal disease: Secondary | ICD-10-CM | POA: Diagnosis not present

## 2022-02-12 DIAGNOSIS — I509 Heart failure, unspecified: Secondary | ICD-10-CM | POA: Diagnosis not present

## 2022-02-12 DIAGNOSIS — I5023 Acute on chronic systolic (congestive) heart failure: Secondary | ICD-10-CM | POA: Diagnosis not present

## 2022-02-12 DIAGNOSIS — J9610 Chronic respiratory failure, unspecified whether with hypoxia or hypercapnia: Secondary | ICD-10-CM | POA: Diagnosis not present

## 2022-02-15 ENCOUNTER — Institutional Professional Consult (permissible substitution): Payer: BC Managed Care – PPO | Admitting: Cardiology

## 2022-02-15 DIAGNOSIS — I5023 Acute on chronic systolic (congestive) heart failure: Secondary | ICD-10-CM | POA: Diagnosis not present

## 2022-02-15 DIAGNOSIS — I5082 Biventricular heart failure: Secondary | ICD-10-CM | POA: Diagnosis not present

## 2022-02-16 DIAGNOSIS — N2581 Secondary hyperparathyroidism of renal origin: Secondary | ICD-10-CM | POA: Diagnosis not present

## 2022-02-16 DIAGNOSIS — Z992 Dependence on renal dialysis: Secondary | ICD-10-CM | POA: Diagnosis not present

## 2022-02-16 DIAGNOSIS — N186 End stage renal disease: Secondary | ICD-10-CM | POA: Diagnosis not present

## 2022-02-16 DIAGNOSIS — T8249XA Other complication of vascular dialysis catheter, initial encounter: Secondary | ICD-10-CM | POA: Diagnosis not present

## 2022-02-18 ENCOUNTER — Emergency Department (HOSPITAL_COMMUNITY)
Admission: EM | Admit: 2022-02-18 | Discharge: 2022-02-18 | Disposition: A | Discharge status: Home / Self Care | Payer: BC Managed Care – PPO | Attending: Emergency Medicine | Admitting: Emergency Medicine

## 2022-02-18 ENCOUNTER — Emergency Department (HOSPITAL_COMMUNITY): Payer: BC Managed Care – PPO

## 2022-02-18 ENCOUNTER — Other Ambulatory Visit: Payer: Self-pay

## 2022-02-18 DIAGNOSIS — E876 Hypokalemia: Secondary | ICD-10-CM | POA: Insufficient documentation

## 2022-02-18 DIAGNOSIS — D649 Anemia, unspecified: Secondary | ICD-10-CM | POA: Insufficient documentation

## 2022-02-18 DIAGNOSIS — I5020 Unspecified systolic (congestive) heart failure: Secondary | ICD-10-CM | POA: Insufficient documentation

## 2022-02-18 DIAGNOSIS — Z992 Dependence on renal dialysis: Secondary | ICD-10-CM | POA: Diagnosis not present

## 2022-02-18 DIAGNOSIS — I480 Paroxysmal atrial fibrillation: Secondary | ICD-10-CM | POA: Diagnosis not present

## 2022-02-18 DIAGNOSIS — T829XXA Unspecified complication of cardiac and vascular prosthetic device, implant and graft, initial encounter: Secondary | ICD-10-CM

## 2022-02-18 DIAGNOSIS — N186 End stage renal disease: Secondary | ICD-10-CM | POA: Diagnosis not present

## 2022-02-18 DIAGNOSIS — I132 Hypertensive heart and chronic kidney disease with heart failure and with stage 5 chronic kidney disease, or end stage renal disease: Secondary | ICD-10-CM | POA: Diagnosis not present

## 2022-02-18 DIAGNOSIS — T8249XA Other complication of vascular dialysis catheter, initial encounter: Secondary | ICD-10-CM | POA: Diagnosis not present

## 2022-02-18 DIAGNOSIS — D72829 Elevated white blood cell count, unspecified: Secondary | ICD-10-CM | POA: Diagnosis not present

## 2022-02-18 DIAGNOSIS — R0602 Shortness of breath: Secondary | ICD-10-CM | POA: Insufficient documentation

## 2022-02-18 DIAGNOSIS — N2581 Secondary hyperparathyroidism of renal origin: Secondary | ICD-10-CM | POA: Diagnosis not present

## 2022-02-18 DIAGNOSIS — R002 Palpitations: Secondary | ICD-10-CM | POA: Diagnosis not present

## 2022-02-18 DIAGNOSIS — M25551 Pain in right hip: Secondary | ICD-10-CM | POA: Diagnosis not present

## 2022-02-18 DIAGNOSIS — I120 Hypertensive chronic kidney disease with stage 5 chronic kidney disease or end stage renal disease: Secondary | ICD-10-CM | POA: Diagnosis not present

## 2022-02-18 LAB — BASIC METABOLIC PANEL
Anion gap: 12 (ref 5–15)
BUN: 51 mg/dL — ABNORMAL HIGH (ref 6–20)
CO2: 22 mmol/L (ref 22–32)
Calcium: 9.5 mg/dL (ref 8.9–10.3)
Chloride: 102 mmol/L (ref 98–111)
Creatinine, Ser: 10.35 mg/dL — ABNORMAL HIGH (ref 0.61–1.24)
GFR, Estimated: 6 mL/min — ABNORMAL LOW (ref >60)
Glucose, Bld: 125 mg/dL — ABNORMAL HIGH (ref 70–99)
Potassium: 3.1 mmol/L — ABNORMAL LOW (ref 3.5–5.1)
Sodium: 136 mmol/L (ref 135–145)

## 2022-02-18 LAB — CBC WITH DIFFERENTIAL/PLATELET
Abs Immature Granulocytes: 0.04 10*3/uL (ref 0.00–0.07)
Basophils Absolute: 0.1 10*3/uL (ref 0.0–0.1)
Basophils Relative: 1 %
Eosinophils Absolute: 0.5 10*3/uL (ref 0.0–0.5)
Eosinophils Relative: 4 %
HCT: 32.1 % — ABNORMAL LOW (ref 39.0–52.0)
Hemoglobin: 10.9 g/dL — ABNORMAL LOW (ref 13.0–17.0)
Immature Granulocytes: 0 %
Lymphocytes Relative: 14 %
Lymphs Abs: 1.6 10*3/uL (ref 0.7–4.0)
MCH: 33.5 pg (ref 26.0–34.0)
MCHC: 34 g/dL (ref 30.0–36.0)
MCV: 98.8 fL (ref 80.0–100.0)
Monocytes Absolute: 0.6 10*3/uL (ref 0.1–1.0)
Monocytes Relative: 6 %
Neutro Abs: 8.3 10*3/uL — ABNORMAL HIGH (ref 1.7–7.7)
Neutrophils Relative %: 75 %
Platelets: 238 10*3/uL (ref 150–400)
RBC: 3.25 MIL/uL — ABNORMAL LOW (ref 4.22–5.81)
RDW: 13.4 % (ref 11.5–15.5)
WBC: 11.1 10*3/uL — ABNORMAL HIGH (ref 4.0–10.5)
nRBC: 0 % (ref 0.0–0.2)

## 2022-02-18 LAB — BRAIN NATRIURETIC PEPTIDE: B Natriuretic Peptide: 373.8 pg/mL — ABNORMAL HIGH (ref 0.0–100.0)

## 2022-02-18 LAB — MAGNESIUM: Magnesium: 1.7 mg/dL (ref 1.7–2.4)

## 2022-02-18 MED ORDER — POTASSIUM CHLORIDE CRYS ER 20 MEQ PO TBCR
40.0000 meq | EXTENDED_RELEASE_TABLET | Freq: Once | ORAL | Status: AC
  Administered 2022-02-18: 40 meq via ORAL
  Filled 2022-02-18: qty 2

## 2022-02-18 NOTE — ED Provider Notes (Signed)
[facility] EMERGENCY DEPARTMENT Provider Note   CSN: [id number] Arrival date & time: 02/18/22  0114     History  Chief Complaint  Patient presents with   Shortness of Breath   Palpitations    Phillip Young is a 36 y.o. male.  The history is provided by the patient.  Shortness of Breath Palpitations Associated symptoms: shortness of breath   He has history of hypertension, prediabetes, end-stage renal disease on hemodialysis, morbid obesity, systolic heart failure, paroxysmal atrial flutter anticoagulated on apixaban and comes in because he is concerned he may have recurrent MRSA infection.  He states that 1 week ago, he noted that the stitch on his tunneled catheter for dialysis use had come out, and the catheter had been pulled out a little bit.  He then noted that he had some stiffness in his neck which is something he had when he had a MRSA infection in the past.  The stiffness actually is improving.  Over the last 2 days, he has noted some increase in his baseline dyspnea.  He did miss 1 dialysis session last week, but has made his last 2 sessions.  The dialysis nurses had checked his catheter and felt it was okay to proceed with dialysis but did recommend that he have it evaluated.  He denies fever, chills, sweats.  Denies any nausea or vomiting.  He denies chest pain, heaviness, tightness, pressure.  There is no change in his chronic leg swelling.  He is also complaining of pain in his right hip for the last 2 days.   Home Medications Prior to Admission medications   Medication Sig Start Date End Date Taking? Authorizing Provider  apixaban (ELIQUIS) 5 MG TABS tablet Take 1 tablet (5 mg total) by mouth 2 (two) times daily. 12/16/21   Larey Dresser, MD  benzonatate (TESSALON) 200 MG capsule Take 1 capsule (200 mg total) by mouth 3 (three) times daily as needed for cough. 09/28/21   Lajean Manes, MD      Allergies    Coreg [carvedilol] and Heparin    Review of  Systems   Review of Systems  Respiratory:  Positive for shortness of breath.   Cardiovascular:  Positive for palpitations.  All other systems reviewed and are negative.   Physical Exam Updated Vital Signs BP (!) 140/93 (BP Location: Right Arm)   Pulse 92   Temp 98.2 F (36.8 C) (Oral)   Resp (!) 24   SpO2 98%  Physical Exam Vitals and nursing note reviewed.   Morbidly obese 36 year old male, resting comfortably and in no acute distress. Vital signs are significant for borderline elevated blood pressure and mildly elevated respiratory rate. Oxygen saturation is 98%, which is normal. Head is normocephalic and atraumatic. PERRLA, EOMI. Oropharynx is clear. Neck is nontender and supple without adenopathy or JVD. Back is nontender and there is no CVA tenderness. Lungs are clear without rales, wheezes, or rhonchi. Chest is nontender.  Tunneled catheter for dialysis use noted in the right subclavian area.  Knee stitch has come out of the skin, but catheter appears to be in an appropriate position without signs of infection. Heart has regular rate and rhythm without murmur. Abdomen is soft, flat, nontender. Extremities have 1+ edema, full range of motion is present. Skin is warm and dry without rash. Neurologic: Mental status is normal, cranial nerves are intact, moves all extremities equally.  ED Results / Procedures / Treatments   Labs (all labs ordered are  listed, but only abnormal results are displayed) Labs Reviewed  CBC WITH DIFFERENTIAL/PLATELET - Abnormal; Notable for the following components:      Result Value   WBC 11.1 (*)    RBC 3.25 (*)    Hemoglobin 10.9 (*)    HCT 32.1 (*)    Neutro Abs 8.3 (*)    All other components within normal limits  BRAIN NATRIURETIC PEPTIDE - Abnormal; Notable for the following components:   B Natriuretic Peptide 373.8 (*)    All other components within normal limits  BASIC METABOLIC PANEL - Abnormal; Notable for the following components:    Potassium 3.1 (*)    Glucose, Bld 125 (*)    BUN 51 (*)    Creatinine, Ser 10.35 (*)    GFR, Estimated 6 (*)    All other components within normal limits  CULTURE, BLOOD (ROUTINE X 2)  MAGNESIUM  TSH    EKG EKG Interpretation  Date/Time:  Thursday February 18 2022 01:41:37 EDT Ventricular Rate:  90 PR Interval:  188 QRS Duration: 98 QT Interval:  406 QTC Calculation: 496 R Axis:   -11 Text Interpretation: Normal sinus rhythm Possible Left atrial enlargement Minimal voltage criteria for LVH, may be normal variant ( Cornell product ) Prolonged QT Abnormal ECG When compared with ECG of 16-Dec-2021 11:15, No significant change was found Confirmed by Delora Fuel ([id number]) on 02/18/2022 4:34:00 AM  Radiology DG Hip Unilat W or Wo Pelvis 2-3 Views Right  Result Date: 02/18/2022 CLINICAL DATA:  Right hip soreness x2 days EXAM: DG HIP (WITH OR WITHOUT PELVIS) 2-3V RIGHT COMPARISON:  None Available. FINDINGS: No fracture or dislocation is seen. Bilateral hip joint spaces are preserved. Visualized bony pelvis appears intact. IMPRESSION: Negative. Electronically Signed   By: Julian Hy M.D.   On: 02/18/2022 03:07   DG Chest 2 View  Result Date: 02/18/2022 CLINICAL DATA:  Shortness of breath EXAM: CHEST - 2 VIEW COMPARISON:  09/02/2021 FINDINGS: Lungs are clear.  No pleural effusion or pneumothorax. Stable cardiomegaly. Right chest dual lumen dialysis catheter terminates in the upper right atrium. IMPRESSION: Stable cardiomegaly. Electronically Signed   By: Julian Hy M.D.   On: 02/18/2022 03:06    Procedures Procedures    Medications Ordered in ED Medications  potassium chloride SA (KLOR-CON M) CR tablet 40 mEq (has no administration in time range)    ED Course/ Medical Decision Making/ A&P                           Medical Decision Making Risk Prescription drug management.   End-stage renal disease on hemodialysis with tunneled catheter for dialysis access.   Clinically, catheter appears to be in good position without evidence of infection.  No evidence of meningismus.  Doubt MRSA infection or related to his catheter.  Chest x-ray shows appropriate position of catheter, no acute process noted.  I have independently viewed the images, and agree with the radiologist's interpretation.  Right hip x-ray shows no evidence of fracture.  I have independently viewed those images, and agree with the radiologist's interpretation.  I have reviewed and interpreted his ECG, and my interpretation is left atrial hypertrophy, left ventricular hypertrophy, borderline prolonged QT interval unchanged from prior ECG.  I have reviewed and interpreted his laboratory tests and my interpretation is hypokalemia, elevated BUN and creatinine consistent with end-stage renal disease, mild anemia which is improved over baseline, mild leukocytosis without any left shift.  I  do not see any clinical evidence of MRSA infection and I feel his catheter is in good location.  However, I will refer him to interventional radiology to have them assessed the catheter and replace sutures if indicated.  He is discharged and instructed to go for his dialysis session this morning and is to call for appointment with interventional radiology to reassess his tunneled catheter.  Also, blood cultures have been obtained, patient advised that he would be called if they are positive.  Final Clinical Impression(s) / ED Diagnoses Final diagnoses:  Complication of central venous catheter, unspecified complication, initial encounter  Hypokalemia  End-stage renal disease on hemodialysis [facility])    Rx / DC Orders ED Discharge Orders          Ordered    IR Radiologist Eval & Mgmt        02/18/22 3299              Delora Fuel, MD 24/26/83 Delrae Rend

## 2022-02-18 NOTE — ED Provider Triage Note (Signed)
Emergency Medicine Provider Triage Evaluation Note  Phillip Young , a 36 y.o. male  was evaluated in triage.  Pt complains of SOB, stiff neck, and soreness in his right hip x 2 days. Hemodialysis patient Tuesday/Thursday/Saturday.  Did not miss his last 2 sessions but did miss Saturday of last week..  Endorses compliance with his Eliquis.  History of MSSA bacteremia and staphylococcal arthritis.  States that how he feels today is similar to how he felt when he had MRSA in the past.  Review of Systems  Positive: Stiff neck, shortness of breath, palpitations Negative: Nausea vomiting diarrhea, syncope, chest pain  Physical Exam  BP (!) 140/93 (BP Location: Right Arm)   Pulse 92   Temp 98.2 F (36.8 C) (Oral)   Resp (!) 24   SpO2 98%  Gen:   Awake, no distress   Resp:  Normal effort  MSK:   Moves extremities without difficulty  Other:  RRR no M/R/G.  Lungs CTA B.  Abdomen soft nondistended nontender.  Right hip without tenderness to palpation, ambulatory without difficulty.  Medical Decision Making  Medically screening exam initiated at 1:41 AM.  Appropriate orders placed.  Phillip Young was informed that the remainder of the evaluation will be completed by another provider, this initial triage assessment does not replace that evaluation, and the importance of remaining in the ED until their evaluation is complete.  Given history of MSSA bacteremia and complex medical history we will proceed with blood cultures in addition to normal shortness of breath work-up  This chart was dictated using voice recognition software, Dragon. Despite the best efforts of this provider to proofread and correct errors, errors may still occur which can change documentation meaning.    Emeline Darling, PA-C 02/18/22 0157

## 2022-02-18 NOTE — ED Triage Notes (Signed)
Pt states ongoing SOB and palpitations for the 2 days ago. Per note pt is a dialysis without any missed treatments. Feeling similar to when he has MRSA in the past. Denies cough/fever/CP

## 2022-02-18 NOTE — Discharge Instructions (Addendum)
Go for your dialysis session today, as scheduled. Have the team at dialysis call the radiology department at [phone] to set up a time for the interventional radiologist at [facility] to check your tunneled catheter.  If your cultures are positive, we will contact you. You can also check those reports on MyChart.

## 2022-02-22 DIAGNOSIS — N186 End stage renal disease: Secondary | ICD-10-CM | POA: Diagnosis not present

## 2022-02-22 DIAGNOSIS — E1122 Type 2 diabetes mellitus with diabetic chronic kidney disease: Secondary | ICD-10-CM | POA: Diagnosis not present

## 2022-02-22 DIAGNOSIS — Z992 Dependence on renal dialysis: Secondary | ICD-10-CM | POA: Diagnosis not present

## 2022-02-23 DIAGNOSIS — N186 End stage renal disease: Secondary | ICD-10-CM | POA: Diagnosis not present

## 2022-02-23 DIAGNOSIS — T8249XA Other complication of vascular dialysis catheter, initial encounter: Secondary | ICD-10-CM | POA: Diagnosis not present

## 2022-02-23 DIAGNOSIS — I5082 Biventricular heart failure: Secondary | ICD-10-CM | POA: Diagnosis not present

## 2022-02-23 DIAGNOSIS — N2581 Secondary hyperparathyroidism of renal origin: Secondary | ICD-10-CM | POA: Diagnosis not present

## 2022-02-23 DIAGNOSIS — I5023 Acute on chronic systolic (congestive) heart failure: Secondary | ICD-10-CM | POA: Diagnosis not present

## 2022-02-23 DIAGNOSIS — Z992 Dependence on renal dialysis: Secondary | ICD-10-CM | POA: Diagnosis not present

## 2022-02-23 LAB — CULTURE, BLOOD (ROUTINE X 2)
Culture: NO GROWTH
Special Requests: ADEQUATE

## 2022-02-24 ENCOUNTER — Ambulatory Visit (HOSPITAL_COMMUNITY): Admission: RE | Admit: 2022-02-24 | Payer: BC Managed Care – PPO | Source: Ambulatory Visit

## 2022-02-24 ENCOUNTER — Encounter (HOSPITAL_COMMUNITY): Payer: Self-pay

## 2022-02-27 DIAGNOSIS — N186 End stage renal disease: Secondary | ICD-10-CM | POA: Diagnosis not present

## 2022-02-27 DIAGNOSIS — N2581 Secondary hyperparathyroidism of renal origin: Secondary | ICD-10-CM | POA: Diagnosis not present

## 2022-02-27 DIAGNOSIS — T8249XA Other complication of vascular dialysis catheter, initial encounter: Secondary | ICD-10-CM | POA: Diagnosis not present

## 2022-02-27 DIAGNOSIS — Z992 Dependence on renal dialysis: Secondary | ICD-10-CM | POA: Diagnosis not present

## 2022-03-01 ENCOUNTER — Encounter: Payer: Self-pay | Admitting: Cardiology

## 2022-03-02 DIAGNOSIS — Z992 Dependence on renal dialysis: Secondary | ICD-10-CM | POA: Diagnosis not present

## 2022-03-02 DIAGNOSIS — N2581 Secondary hyperparathyroidism of renal origin: Secondary | ICD-10-CM | POA: Diagnosis not present

## 2022-03-02 DIAGNOSIS — T8249XA Other complication of vascular dialysis catheter, initial encounter: Secondary | ICD-10-CM | POA: Diagnosis not present

## 2022-03-02 DIAGNOSIS — N186 End stage renal disease: Secondary | ICD-10-CM | POA: Diagnosis not present

## 2022-03-03 ENCOUNTER — Encounter (INDEPENDENT_AMBULATORY_CARE_PROVIDER_SITE_OTHER): Payer: Self-pay

## 2022-03-04 DIAGNOSIS — N2581 Secondary hyperparathyroidism of renal origin: Secondary | ICD-10-CM | POA: Diagnosis not present

## 2022-03-04 DIAGNOSIS — Z992 Dependence on renal dialysis: Secondary | ICD-10-CM | POA: Diagnosis not present

## 2022-03-04 DIAGNOSIS — N186 End stage renal disease: Secondary | ICD-10-CM | POA: Diagnosis not present

## 2022-03-04 DIAGNOSIS — T8249XA Other complication of vascular dialysis catheter, initial encounter: Secondary | ICD-10-CM | POA: Diagnosis not present

## 2022-03-06 DIAGNOSIS — Z992 Dependence on renal dialysis: Secondary | ICD-10-CM | POA: Diagnosis not present

## 2022-03-06 DIAGNOSIS — T8249XA Other complication of vascular dialysis catheter, initial encounter: Secondary | ICD-10-CM | POA: Diagnosis not present

## 2022-03-06 DIAGNOSIS — N2581 Secondary hyperparathyroidism of renal origin: Secondary | ICD-10-CM | POA: Diagnosis not present

## 2022-03-06 DIAGNOSIS — N186 End stage renal disease: Secondary | ICD-10-CM | POA: Diagnosis not present

## 2022-03-07 DIAGNOSIS — E662 Morbid (severe) obesity with alveolar hypoventilation: Secondary | ICD-10-CM | POA: Diagnosis not present

## 2022-03-07 DIAGNOSIS — I509 Heart failure, unspecified: Secondary | ICD-10-CM | POA: Diagnosis not present

## 2022-03-07 DIAGNOSIS — J9610 Chronic respiratory failure, unspecified whether with hypoxia or hypercapnia: Secondary | ICD-10-CM | POA: Diagnosis not present

## 2022-03-07 DIAGNOSIS — I5023 Acute on chronic systolic (congestive) heart failure: Secondary | ICD-10-CM | POA: Diagnosis not present

## 2022-03-08 ENCOUNTER — Encounter (HOSPITAL_COMMUNITY): Payer: Self-pay | Admitting: *Deleted

## 2022-03-09 DIAGNOSIS — T8249XA Other complication of vascular dialysis catheter, initial encounter: Secondary | ICD-10-CM | POA: Diagnosis not present

## 2022-03-09 DIAGNOSIS — N186 End stage renal disease: Secondary | ICD-10-CM | POA: Diagnosis not present

## 2022-03-09 DIAGNOSIS — Z992 Dependence on renal dialysis: Secondary | ICD-10-CM | POA: Diagnosis not present

## 2022-03-09 DIAGNOSIS — N2581 Secondary hyperparathyroidism of renal origin: Secondary | ICD-10-CM | POA: Diagnosis not present

## 2022-03-11 DIAGNOSIS — N2581 Secondary hyperparathyroidism of renal origin: Secondary | ICD-10-CM | POA: Diagnosis not present

## 2022-03-11 DIAGNOSIS — N186 End stage renal disease: Secondary | ICD-10-CM | POA: Diagnosis not present

## 2022-03-11 DIAGNOSIS — Z992 Dependence on renal dialysis: Secondary | ICD-10-CM | POA: Diagnosis not present

## 2022-03-11 DIAGNOSIS — T8249XA Other complication of vascular dialysis catheter, initial encounter: Secondary | ICD-10-CM | POA: Diagnosis not present

## 2022-03-15 DIAGNOSIS — I509 Heart failure, unspecified: Secondary | ICD-10-CM | POA: Diagnosis not present

## 2022-03-15 DIAGNOSIS — I5023 Acute on chronic systolic (congestive) heart failure: Secondary | ICD-10-CM | POA: Diagnosis not present

## 2022-03-15 DIAGNOSIS — J9610 Chronic respiratory failure, unspecified whether with hypoxia or hypercapnia: Secondary | ICD-10-CM | POA: Diagnosis not present

## 2022-03-16 ENCOUNTER — Telehealth (HOSPITAL_COMMUNITY): Payer: Self-pay

## 2022-03-16 DIAGNOSIS — Z992 Dependence on renal dialysis: Secondary | ICD-10-CM | POA: Diagnosis not present

## 2022-03-16 DIAGNOSIS — T8249XA Other complication of vascular dialysis catheter, initial encounter: Secondary | ICD-10-CM | POA: Diagnosis not present

## 2022-03-16 DIAGNOSIS — N186 End stage renal disease: Secondary | ICD-10-CM | POA: Diagnosis not present

## 2022-03-16 DIAGNOSIS — I5022 Chronic systolic (congestive) heart failure: Secondary | ICD-10-CM

## 2022-03-16 DIAGNOSIS — N2581 Secondary hyperparathyroidism of renal origin: Secondary | ICD-10-CM | POA: Diagnosis not present

## 2022-03-16 NOTE — Telephone Encounter (Signed)
Needs echocardiogram for DOT paperwork.  Echo scheduled

## 2022-03-18 DIAGNOSIS — T8249XA Other complication of vascular dialysis catheter, initial encounter: Secondary | ICD-10-CM | POA: Diagnosis not present

## 2022-03-18 DIAGNOSIS — N2581 Secondary hyperparathyroidism of renal origin: Secondary | ICD-10-CM | POA: Diagnosis not present

## 2022-03-18 DIAGNOSIS — Z992 Dependence on renal dialysis: Secondary | ICD-10-CM | POA: Diagnosis not present

## 2022-03-18 DIAGNOSIS — N186 End stage renal disease: Secondary | ICD-10-CM | POA: Diagnosis not present

## 2022-03-20 DIAGNOSIS — N186 End stage renal disease: Secondary | ICD-10-CM | POA: Diagnosis not present

## 2022-03-20 DIAGNOSIS — Z992 Dependence on renal dialysis: Secondary | ICD-10-CM | POA: Diagnosis not present

## 2022-03-20 DIAGNOSIS — N2581 Secondary hyperparathyroidism of renal origin: Secondary | ICD-10-CM | POA: Diagnosis not present

## 2022-03-20 DIAGNOSIS — T8249XA Other complication of vascular dialysis catheter, initial encounter: Secondary | ICD-10-CM | POA: Diagnosis not present

## 2022-03-23 DIAGNOSIS — Z992 Dependence on renal dialysis: Secondary | ICD-10-CM | POA: Diagnosis not present

## 2022-03-23 DIAGNOSIS — N186 End stage renal disease: Secondary | ICD-10-CM | POA: Diagnosis not present

## 2022-03-23 DIAGNOSIS — T8249XA Other complication of vascular dialysis catheter, initial encounter: Secondary | ICD-10-CM | POA: Diagnosis not present

## 2022-03-23 DIAGNOSIS — N2581 Secondary hyperparathyroidism of renal origin: Secondary | ICD-10-CM | POA: Diagnosis not present

## 2022-03-24 ENCOUNTER — Inpatient Hospital Stay (HOSPITAL_BASED_OUTPATIENT_CLINIC_OR_DEPARTMENT_OTHER)
Admission: RE | Admit: 2022-03-24 | Discharge: 2022-03-24 | Disposition: A | Discharge status: Home / Self Care | Payer: BC Managed Care – PPO | Source: Ambulatory Visit | Attending: Internal Medicine | Admitting: Internal Medicine

## 2022-03-24 DIAGNOSIS — Z6841 Body Mass Index (BMI) 40.0 and over, adult: Secondary | ICD-10-CM | POA: Diagnosis not present

## 2022-03-24 DIAGNOSIS — B349 Viral infection, unspecified: Secondary | ICD-10-CM | POA: Diagnosis not present

## 2022-03-24 DIAGNOSIS — R7303 Prediabetes: Secondary | ICD-10-CM | POA: Diagnosis not present

## 2022-03-24 DIAGNOSIS — I5082 Biventricular heart failure: Secondary | ICD-10-CM | POA: Diagnosis not present

## 2022-03-24 DIAGNOSIS — Z87891 Personal history of nicotine dependence: Secondary | ICD-10-CM | POA: Insufficient documentation

## 2022-03-24 DIAGNOSIS — E1122 Type 2 diabetes mellitus with diabetic chronic kidney disease: Secondary | ICD-10-CM | POA: Diagnosis not present

## 2022-03-24 DIAGNOSIS — R053 Chronic cough: Secondary | ICD-10-CM | POA: Diagnosis present

## 2022-03-24 DIAGNOSIS — Z888 Allergy status to other drugs, medicaments and biological substances status: Secondary | ICD-10-CM | POA: Diagnosis not present

## 2022-03-24 DIAGNOSIS — Z8249 Family history of ischemic heart disease and other diseases of the circulatory system: Secondary | ICD-10-CM | POA: Diagnosis not present

## 2022-03-24 DIAGNOSIS — N2581 Secondary hyperparathyroidism of renal origin: Secondary | ICD-10-CM | POA: Diagnosis not present

## 2022-03-24 DIAGNOSIS — G4730 Sleep apnea, unspecified: Secondary | ICD-10-CM | POA: Insufficient documentation

## 2022-03-24 DIAGNOSIS — I340 Nonrheumatic mitral (valve) insufficiency: Secondary | ICD-10-CM | POA: Insufficient documentation

## 2022-03-24 DIAGNOSIS — Z7901 Long term (current) use of anticoagulants: Secondary | ICD-10-CM | POA: Diagnosis not present

## 2022-03-24 DIAGNOSIS — I509 Heart failure, unspecified: Secondary | ICD-10-CM | POA: Diagnosis not present

## 2022-03-24 DIAGNOSIS — I4891 Unspecified atrial fibrillation: Secondary | ICD-10-CM | POA: Insufficient documentation

## 2022-03-24 DIAGNOSIS — N186 End stage renal disease: Secondary | ICD-10-CM | POA: Diagnosis not present

## 2022-03-24 DIAGNOSIS — M2550 Pain in unspecified joint: Secondary | ICD-10-CM | POA: Diagnosis not present

## 2022-03-24 DIAGNOSIS — Z515 Encounter for palliative care: Secondary | ICD-10-CM | POA: Diagnosis not present

## 2022-03-24 DIAGNOSIS — I5022 Chronic systolic (congestive) heart failure: Secondary | ICD-10-CM | POA: Diagnosis not present

## 2022-03-24 DIAGNOSIS — Z992 Dependence on renal dialysis: Secondary | ICD-10-CM | POA: Diagnosis not present

## 2022-03-24 DIAGNOSIS — R6520 Severe sepsis without septic shock: Secondary | ICD-10-CM | POA: Diagnosis not present

## 2022-03-24 DIAGNOSIS — I2510 Atherosclerotic heart disease of native coronary artery without angina pectoris: Secondary | ICD-10-CM | POA: Insufficient documentation

## 2022-03-24 DIAGNOSIS — Z7189 Other specified counseling: Secondary | ICD-10-CM | POA: Diagnosis not present

## 2022-03-24 DIAGNOSIS — I428 Other cardiomyopathies: Secondary | ICD-10-CM | POA: Diagnosis not present

## 2022-03-24 DIAGNOSIS — A419 Sepsis, unspecified organism: Secondary | ICD-10-CM | POA: Diagnosis not present

## 2022-03-24 DIAGNOSIS — I5023 Acute on chronic systolic (congestive) heart failure: Secondary | ICD-10-CM | POA: Diagnosis not present

## 2022-03-24 DIAGNOSIS — Z20822 Contact with and (suspected) exposure to covid-19: Secondary | ICD-10-CM | POA: Diagnosis not present

## 2022-03-24 DIAGNOSIS — R0602 Shortness of breath: Secondary | ICD-10-CM | POA: Diagnosis not present

## 2022-03-24 DIAGNOSIS — E662 Morbid (severe) obesity with alveolar hypoventilation: Secondary | ICD-10-CM | POA: Diagnosis not present

## 2022-03-24 DIAGNOSIS — J9601 Acute respiratory failure with hypoxia: Secondary | ICD-10-CM | POA: Diagnosis not present

## 2022-03-24 DIAGNOSIS — J111 Influenza due to unidentified influenza virus with other respiratory manifestations: Secondary | ICD-10-CM | POA: Diagnosis not present

## 2022-03-24 DIAGNOSIS — D631 Anemia in chronic kidney disease: Secondary | ICD-10-CM | POA: Diagnosis not present

## 2022-03-24 DIAGNOSIS — I132 Hypertensive heart and chronic kidney disease with heart failure and with stage 5 chronic kidney disease, or end stage renal disease: Secondary | ICD-10-CM | POA: Insufficient documentation

## 2022-03-24 DIAGNOSIS — J811 Chronic pulmonary edema: Secondary | ICD-10-CM | POA: Diagnosis not present

## 2022-03-24 LAB — ECHOCARDIOGRAM COMPLETE
Area-P 1/2: 4.56 cm2
S' Lateral: 6 cm

## 2022-03-25 ENCOUNTER — Telehealth (HOSPITAL_COMMUNITY): Payer: Self-pay

## 2022-03-25 DIAGNOSIS — E1122 Type 2 diabetes mellitus with diabetic chronic kidney disease: Secondary | ICD-10-CM | POA: Diagnosis not present

## 2022-03-25 DIAGNOSIS — Z992 Dependence on renal dialysis: Secondary | ICD-10-CM | POA: Diagnosis not present

## 2022-03-25 DIAGNOSIS — N186 End stage renal disease: Secondary | ICD-10-CM | POA: Diagnosis not present

## 2022-03-25 NOTE — Telephone Encounter (Signed)
Called patient about echocardiogram results. Unable to leave message mailbox full

## 2022-03-26 ENCOUNTER — Encounter (HOSPITAL_COMMUNITY): Payer: Self-pay | Admitting: Emergency Medicine

## 2022-03-26 ENCOUNTER — Other Ambulatory Visit: Payer: Self-pay

## 2022-03-26 ENCOUNTER — Emergency Department (HOSPITAL_COMMUNITY): Payer: BC Managed Care – PPO

## 2022-03-26 ENCOUNTER — Inpatient Hospital Stay (HOSPITAL_COMMUNITY)
Admission: EM | Admit: 2022-03-26 | Discharge: 2022-03-28 | DRG: 291 | Disposition: A | Discharge status: Home / Self Care | Payer: BC Managed Care – PPO | Attending: Internal Medicine | Admitting: Internal Medicine

## 2022-03-26 DIAGNOSIS — I509 Heart failure, unspecified: Secondary | ICD-10-CM | POA: Diagnosis present

## 2022-03-26 DIAGNOSIS — D631 Anemia in chronic kidney disease: Secondary | ICD-10-CM | POA: Diagnosis present

## 2022-03-26 DIAGNOSIS — Z515 Encounter for palliative care: Secondary | ICD-10-CM

## 2022-03-26 DIAGNOSIS — J9601 Acute respiratory failure with hypoxia: Secondary | ICD-10-CM

## 2022-03-26 DIAGNOSIS — Z992 Dependence on renal dialysis: Secondary | ICD-10-CM

## 2022-03-26 DIAGNOSIS — I5023 Acute on chronic systolic (congestive) heart failure: Secondary | ICD-10-CM | POA: Diagnosis present

## 2022-03-26 DIAGNOSIS — Z87891 Personal history of nicotine dependence: Secondary | ICD-10-CM

## 2022-03-26 DIAGNOSIS — N186 End stage renal disease: Secondary | ICD-10-CM | POA: Diagnosis present

## 2022-03-26 DIAGNOSIS — A419 Sepsis, unspecified organism: Secondary | ICD-10-CM | POA: Insufficient documentation

## 2022-03-26 DIAGNOSIS — E662 Morbid (severe) obesity with alveolar hypoventilation: Secondary | ICD-10-CM | POA: Diagnosis present

## 2022-03-26 DIAGNOSIS — B349 Viral infection, unspecified: Secondary | ICD-10-CM | POA: Diagnosis present

## 2022-03-26 DIAGNOSIS — Z6841 Body Mass Index (BMI) 40.0 and over, adult: Secondary | ICD-10-CM

## 2022-03-26 DIAGNOSIS — N2581 Secondary hyperparathyroidism of renal origin: Secondary | ICD-10-CM | POA: Diagnosis present

## 2022-03-26 DIAGNOSIS — R6520 Severe sepsis without septic shock: Secondary | ICD-10-CM | POA: Diagnosis not present

## 2022-03-26 DIAGNOSIS — Z7189 Other specified counseling: Secondary | ICD-10-CM | POA: Insufficient documentation

## 2022-03-26 DIAGNOSIS — Z20822 Contact with and (suspected) exposure to covid-19: Secondary | ICD-10-CM | POA: Diagnosis present

## 2022-03-26 DIAGNOSIS — Z888 Allergy status to other drugs, medicaments and biological substances status: Secondary | ICD-10-CM | POA: Diagnosis not present

## 2022-03-26 DIAGNOSIS — I132 Hypertensive heart and chronic kidney disease with heart failure and with stage 5 chronic kidney disease, or end stage renal disease: Secondary | ICD-10-CM | POA: Diagnosis present

## 2022-03-26 DIAGNOSIS — R7303 Prediabetes: Secondary | ICD-10-CM | POA: Diagnosis present

## 2022-03-26 DIAGNOSIS — J111 Influenza due to unidentified influenza virus with other respiratory manifestations: Secondary | ICD-10-CM | POA: Diagnosis not present

## 2022-03-26 DIAGNOSIS — M2550 Pain in unspecified joint: Secondary | ICD-10-CM | POA: Diagnosis not present

## 2022-03-26 DIAGNOSIS — I428 Other cardiomyopathies: Secondary | ICD-10-CM | POA: Diagnosis present

## 2022-03-26 DIAGNOSIS — R053 Chronic cough: Secondary | ICD-10-CM | POA: Diagnosis present

## 2022-03-26 DIAGNOSIS — I4891 Unspecified atrial fibrillation: Secondary | ICD-10-CM | POA: Diagnosis present

## 2022-03-26 DIAGNOSIS — Z7901 Long term (current) use of anticoagulants: Secondary | ICD-10-CM

## 2022-03-26 DIAGNOSIS — Z8249 Family history of ischemic heart disease and other diseases of the circulatory system: Secondary | ICD-10-CM

## 2022-03-26 DIAGNOSIS — Z8619 Personal history of other infectious and parasitic diseases: Secondary | ICD-10-CM | POA: Insufficient documentation

## 2022-03-26 DIAGNOSIS — R6521 Severe sepsis with septic shock: Secondary | ICD-10-CM | POA: Insufficient documentation

## 2022-03-26 LAB — CBC WITH DIFFERENTIAL/PLATELET
Abs Immature Granulocytes: 0.03 K/uL (ref 0.00–0.07)
Basophils Absolute: 0.1 K/uL (ref 0.0–0.1)
Basophils Relative: 1 %
Eosinophils Absolute: 0.4 K/uL (ref 0.0–0.5)
Eosinophils Relative: 3 %
HCT: 29.4 % — ABNORMAL LOW (ref 39.0–52.0)
Hemoglobin: 10.1 g/dL — ABNORMAL LOW (ref 13.0–17.0)
Immature Granulocytes: 0 %
Lymphocytes Relative: 8 %
Lymphs Abs: 0.9 K/uL (ref 0.7–4.0)
MCH: 34.5 pg — ABNORMAL HIGH (ref 26.0–34.0)
MCHC: 34.4 g/dL (ref 30.0–36.0)
MCV: 100.3 fL — ABNORMAL HIGH (ref 80.0–100.0)
Monocytes Absolute: 0.8 K/uL (ref 0.1–1.0)
Monocytes Relative: 7 %
Neutro Abs: 9 K/uL — ABNORMAL HIGH (ref 1.7–7.7)
Neutrophils Relative %: 81 %
Platelets: 223 K/uL (ref 150–400)
RBC: 2.93 MIL/uL — ABNORMAL LOW (ref 4.22–5.81)
RDW: 13.4 % (ref 11.5–15.5)
WBC: 11.2 K/uL — ABNORMAL HIGH (ref 4.0–10.5)
nRBC: 0 % (ref 0.0–0.2)

## 2022-03-26 LAB — COMPREHENSIVE METABOLIC PANEL WITH GFR
ALT: 9 U/L (ref 0–44)
AST: 12 U/L — ABNORMAL LOW (ref 15–41)
Albumin: 3.7 g/dL (ref 3.5–5.0)
Alkaline Phosphatase: 87 U/L (ref 38–126)
Anion gap: 14 (ref 5–15)
BUN: 53 mg/dL — ABNORMAL HIGH (ref 6–20)
CO2: 22 mmol/L (ref 22–32)
Calcium: 8.9 mg/dL (ref 8.9–10.3)
Chloride: 100 mmol/L (ref 98–111)
Creatinine, Ser: 11.1 mg/dL — ABNORMAL HIGH (ref 0.61–1.24)
GFR, Estimated: 6 mL/min — ABNORMAL LOW
Glucose, Bld: 111 mg/dL — ABNORMAL HIGH (ref 70–99)
Potassium: 3.5 mmol/L (ref 3.5–5.1)
Sodium: 136 mmol/L (ref 135–145)
Total Bilirubin: 1.1 mg/dL (ref 0.3–1.2)
Total Protein: 7.7 g/dL (ref 6.5–8.1)

## 2022-03-26 LAB — VITAMIN B12: Vitamin B-12: 297 pg/mL (ref 180–914)

## 2022-03-26 LAB — BLOOD GAS, VENOUS
Acid-Base Excess: 1.3 mmol/L (ref 0.0–2.0)
Bicarbonate: 25 mmol/L (ref 20.0–28.0)
Drawn by: 1390
O2 Saturation: 82.4 %
Patient temperature: 37.3
pCO2, Ven: 37 mmHg — ABNORMAL LOW (ref 44–60)
pH, Ven: 7.44 — ABNORMAL HIGH (ref 7.25–7.43)
pO2, Ven: 52 mmHg — ABNORMAL HIGH (ref 32–45)

## 2022-03-26 LAB — IRON AND TIBC
Iron: 27 ug/dL — ABNORMAL LOW (ref 45–182)
Saturation Ratios: 8 % — ABNORMAL LOW (ref 17.9–39.5)
TIBC: 321 ug/dL (ref 250–450)
UIBC: 294 ug/dL

## 2022-03-26 LAB — PROTIME-INR
INR: 1.2 (ref 0.8–1.2)
Prothrombin Time: 15 s (ref 11.4–15.2)

## 2022-03-26 LAB — BRAIN NATRIURETIC PEPTIDE: B Natriuretic Peptide: 374.2 pg/mL — ABNORMAL HIGH (ref 0.0–100.0)

## 2022-03-26 LAB — APTT: aPTT: 31 seconds (ref 24–36)

## 2022-03-26 LAB — HEPATITIS B SURFACE ANTIGEN: Hepatitis B Surface Ag: NONREACTIVE

## 2022-03-26 LAB — RESP PANEL BY RT-PCR (FLU A&B, COVID) ARPGX2
Influenza A by PCR: NEGATIVE
Influenza B by PCR: NEGATIVE
SARS Coronavirus 2 by RT PCR: NEGATIVE

## 2022-03-26 LAB — FERRITIN: Ferritin: 370 ng/mL — ABNORMAL HIGH (ref 24–336)

## 2022-03-26 LAB — LACTIC ACID, PLASMA: Lactic Acid, Venous: 1.2 mmol/L (ref 0.5–1.9)

## 2022-03-26 LAB — HEMOGLOBIN A1C
Hgb A1c MFr Bld: 5.3 % (ref 4.8–5.6)
Mean Plasma Glucose: 105.41 mg/dL

## 2022-03-26 LAB — HEPATITIS C ANTIBODY: HCV Ab: NONREACTIVE

## 2022-03-26 LAB — HEPATITIS B SURFACE ANTIBODY,QUALITATIVE: Hep B S Ab: REACTIVE — AB

## 2022-03-26 LAB — HEPATITIS B CORE ANTIBODY, TOTAL: Hep B Core Total Ab: NONREACTIVE

## 2022-03-26 MED ORDER — ACETAMINOPHEN 650 MG RE SUPP: 650.0000 mg | Freq: Four times a day (QID) | RECTAL | Status: DC | PRN

## 2022-03-26 MED ORDER — LIDOCAINE HCL (PF) 1 % IJ SOLN: 5.0000 mL | INTRAMUSCULAR | Status: DC | PRN

## 2022-03-26 MED ORDER — VANCOMYCIN VARIABLE DOSE PER UNSTABLE RENAL FUNCTION (PHARMACIST DOSING): Status: DC

## 2022-03-26 MED ORDER — NITROGLYCERIN 2 % TD OINT
0.5000 [in_us] | TOPICAL_OINTMENT | Freq: Once | TRANSDERMAL | Status: DC
  Filled 2022-03-26: qty 1

## 2022-03-26 MED ORDER — ANTICOAGULANT SODIUM CITRATE 4% (200MG/5ML) IV SOLN
5.0000 mL | Status: DC | PRN
  Filled 2022-03-26 (×2): qty 5

## 2022-03-26 MED ORDER — ACETAMINOPHEN 325 MG PO TABS
650.0000 mg | ORAL_TABLET | Freq: Four times a day (QID) | ORAL | Status: DC | PRN
  Administered 2022-03-26 – 2022-03-27 (×2): 650 mg via ORAL
  Filled 2022-03-26 (×2): qty 2

## 2022-03-26 MED ORDER — LANTHANUM CARBONATE 500 MG PO CHEW
2000.0000 mg | CHEWABLE_TABLET | Freq: Three times a day (TID) | ORAL | Status: DC
  Filled 2022-03-26 (×7): qty 4

## 2022-03-26 MED ORDER — SODIUM CHLORIDE 0.9 % IV SOLN
2.0000 g | Freq: Once | INTRAVENOUS | Status: AC
  Administered 2022-03-26: 2 g via INTRAVENOUS
  Filled 2022-03-26: qty 12.5

## 2022-03-26 MED ORDER — CHLORHEXIDINE GLUCONATE CLOTH 2 % EX PADS: 6.0000 | MEDICATED_PAD | Freq: Every day | CUTANEOUS | Status: DC

## 2022-03-26 MED ORDER — ACETAMINOPHEN 500 MG PO TABS
1000.0000 mg | ORAL_TABLET | Freq: Once | ORAL | Status: AC
  Administered 2022-03-26: 1000 mg via ORAL
  Filled 2022-03-26: qty 2

## 2022-03-26 MED ORDER — FUROSEMIDE 10 MG/ML IJ SOLN
20.0000 mg | Freq: Once | INTRAMUSCULAR | Status: DC
  Filled 2022-03-26: qty 2

## 2022-03-26 MED ORDER — ALTEPLASE 2 MG IJ SOLR: 2.0000 mg | Freq: Once | INTRAMUSCULAR | Status: DC | PRN

## 2022-03-26 MED ORDER — ANTICOAGULANT SODIUM CITRATE 4% (200MG/5ML) IV SOLN
5.0000 mL | Status: DC | PRN
Start: 2022-03-26 — End: 2022-03-27
  Administered 2022-03-26: 5 mL
  Filled 2022-03-26 (×2): qty 5

## 2022-03-26 MED ORDER — PENTAFLUOROPROP-TETRAFLUOROETH EX AERO: 1.0000 | INHALATION_SPRAY | CUTANEOUS | Status: DC | PRN

## 2022-03-26 MED ORDER — VANCOMYCIN HCL IN DEXTROSE 1-5 GM/200ML-% IV SOLN
1000.0000 mg | INTRAVENOUS | Status: AC
  Administered 2022-03-26: 1000 mg via INTRAVENOUS
  Filled 2022-03-26: qty 200

## 2022-03-26 MED ORDER — SODIUM CHLORIDE 0.9 % IV SOLN
1.0000 g | INTRAVENOUS | Status: DC
  Administered 2022-03-27 – 2022-03-28 (×2): 1 g via INTRAVENOUS
  Filled 2022-03-26 (×2): qty 10

## 2022-03-26 MED ORDER — VANCOMYCIN HCL 2000 MG/400ML IV SOLN
2000.0000 mg | Freq: Once | INTRAVENOUS | Status: AC
  Administered 2022-03-26: 2000 mg via INTRAVENOUS
  Filled 2022-03-26: qty 400

## 2022-03-26 MED ORDER — LIDOCAINE-PRILOCAINE 2.5-2.5 % EX CREA: 1.0000 | TOPICAL_CREAM | CUTANEOUS | Status: DC | PRN

## 2022-03-26 MED ORDER — DOXERCALCIFEROL 4 MCG/2ML IV SOLN
5.0000 ug | INTRAVENOUS | Status: DC
  Administered 2022-03-27: 5 ug via INTRAVENOUS
  Filled 2022-03-26 (×4): qty 4

## 2022-03-26 MED ORDER — BENZONATATE 100 MG PO CAPS
200.0000 mg | ORAL_CAPSULE | Freq: Three times a day (TID) | ORAL | Status: DC | PRN
Start: 2022-03-26 — End: 2022-03-28
  Filled 2022-03-26: qty 2

## 2022-03-26 MED ORDER — CINACALCET HCL 30 MG PO TABS
30.0000 mg | ORAL_TABLET | Freq: Every day | ORAL | Status: DC
  Filled 2022-03-26 (×2): qty 1

## 2022-03-26 MED ORDER — ONDANSETRON HCL 4 MG PO TABS: 4.0000 mg | ORAL_TABLET | Freq: Four times a day (QID) | ORAL | Status: DC | PRN

## 2022-03-26 MED ORDER — APIXABAN 5 MG PO TABS
5.0000 mg | ORAL_TABLET | Freq: Two times a day (BID) | ORAL | Status: DC
  Administered 2022-03-26 – 2022-03-28 (×5): 5 mg via ORAL
  Filled 2022-03-26 (×5): qty 1

## 2022-03-26 MED ORDER — ONDANSETRON HCL 4 MG/2ML IJ SOLN: 4.0000 mg | Freq: Four times a day (QID) | INTRAMUSCULAR | Status: DC | PRN

## 2022-03-26 MED ORDER — DARBEPOETIN ALFA 25 MCG/0.42ML IJ SOSY
25.0000 ug | PREFILLED_SYRINGE | INTRAMUSCULAR | Status: DC
  Administered 2022-03-27: 25 ug via INTRAVENOUS
  Filled 2022-03-26 (×2): qty 0.42

## 2022-03-26 NOTE — Progress Notes (Signed)
IMTS Daily Note  Subjective:  36yo man with ESRD on T/TH/S HD, HFrEF (EF 20-25%), Afib (on Eliquis, currently in NSR), and prior MSSA bacteremia c/b sacral & L iliac osteomyelitis who presents with acute, progressive cough, dyspnea on exertion, and general malaise with a fever in the ER.   He was in his baseline state of health until Tuesday, when he developed a cough. On Wednesday, he developed general malaise, a headache, and body aches. On Thursday, he was not able to go to his regular HD because the center flooded, and he also developed increased shortness of breath. Today, his symptoms were worse, and he felt weak, so he presented to the ER. He feels like he did when he was bacteremic before.  ROS: + R knee pain x 2 days, + back pain (chronic), +headaches   Objective:  Vital signs in last 24 hours: Vitals:   03/26/22 1245 03/26/22 1312 03/26/22 1330 03/26/22 1424  BP: 134/89 111/78 (!) 120/96 (!) 123/98  Pulse: 89 89 90 100  Resp: 20 (!) 32 (!) 24 (!) 22  Temp: 98.1 F (36.7 C)     TempSrc: Oral     SpO2: 100% 100% 100%      In the ER, Tm 100.5, HR 84, BP 130/97, satting 100% on 2Lnc.  On my exam, he is lying in dialysis, comfortable, and in no acute distress. Heart has regular rate & rhythm with no murmurs. Lungs have bilateral crackles but he's moving air well. Abdomen is obese, soft, and nontender with normal bowel sounds. No pain to palpation down his C/T/L spine. His R subclavian dialysis line is pulled out from it's origin, but skin around it is not tender or red, and no push coming out of it. The line does not look clean. R knee is not swollen or erythematous.   Labs reviewed. WBC 11.2, Cr 11, Lactate 1.2, blood cultures x2 sent. COVID negative.  CXR with pulmonary edema but no focal consolidation  Assessment/Plan:  Principal Problem:   Acute exacerbation of CHF (congestive heart failure) ([name])  A/P: Sepsis from possible line infection vs respiratory infection:  Patient presented with a fever & leukocytosis. He is at risk for bacteremia from a line infection, so we'll follow up the blood cultures, but will continue HD through his line for now. Would pull line if he is bacteremic. He could also have a respiratory infection with his cough and hypoxia, but it's difficult to tell if these symptoms are from volume overload vs infection. Vanc/Ceftriaxone.   Hypoxia, pulmonary edema: I think this is from missing HD, so he's getting HD now, and will get again tomorrow. If O2 requirement doesn't improve with HD, consider repeat CXR to look for focal process.  Goals of care: Patient had asked the Nephrology APP if he could have his dialysis catheter removed today. I talked to him for a while about this. In his mind, he has historically done better than the doctors have told him he would. For example, when he was hospitalized previously, the doctors said he might never make urine again, and he now makes some urine. So, in his mind, he is hopeful that if he were to stop dialysis, his body would do better than the doctors anticipate, and he would be able to live without dialysis. That is why he is asking to stop dialysis - to give his body a chance to live without dialysis, because he's not convinced that he needs it. We discussed code status, and  he is adamantly still a full code. He wants to live for a long time, and is not ready to die. Taking these goals into consideration, I agree that full code is appropriate. I don't think we should remove his dialysis catheter right now solely to see how his body does without dialysis because he's acutely ill. We will consult Palliative Care to help [place] with these ongoing discussions, but I think this is a good start. He's agreeable to continuing dialysis for now.   Dispo: Anticipated discharge in approximately 3-4 day(s).   Lottie Mussel, MD 03/26/2022, 2:58 PM

## 2022-03-26 NOTE — ED Notes (Signed)
Report given to Gwenlyn Perking, RN of Hemodialysis

## 2022-03-26 NOTE — ED Notes (Signed)
Pt coming from xray 
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
## 2022-03-26 NOTE — Consult Note (Signed)
[facility] Renal Consultation Note    Indication for Consultation:  Management of ESRD/hemodialysis; anemia, hypertension/volume and secondary hyperparathyroidism PCP:  HPI: Phillip Young is a 36 y.o. male with ESRD on hemodialysis T,Th,S. Last HD 03/23/2022 signed off early and left 5 kg above OP EDW. He missed HD 03/25/2022. H/O truncated HD treatments and high IDWG.  PMH: BiV HFrEF (last EF 20-25% 03/24/2022) Morbid obesity, AFib on Eliquis, hidradenitis suppurativa, OSA, HIT-SRA positive, H/O MSSA bacteremia, sacral and L iliac osteomyelitis with abscess, SHPT, Anemia of chronic disease.   Patient presented to ED with C/O SOB after missing HD 03/25/2022. SCr 11.1 BUN 53 CO2 22 K+ 3.5 WBC 11.2 HGB 10.1 PLT 238. PCXR with pulmonary vascular congestion. He has been admitted for acute on chronic HF exacerbation. Will have HD today on schedule and again tomorrow to resume T,Th,S schedule. He was noted to have temperature 99.6. Patient is concerned about TDC infection. BC pending. He has been started on broad spectrum ABX per primary.   Patient asked me to come to bedside. He is asking if I can have dialysis catheter removed. Says he is tired of being on dialysis and is considering stopping. Nothing in previous notes regarding this issue. Have reached out to primary. Will consult Palliative Care.   Past Medical History:  Diagnosis Date   Biventricular congestive heart failure ([place])    Last Echo 11/2019 at [facility] reveals EF 20%   Class 3 severe obesity due to excess calories with serious comorbidity and body mass index (BMI) of 50.0 to 59.9 in adult [facility]) 02/26/2020   Essential hypertension 02/26/2020   GERD without esophagitis 02/26/2020   Hidradenitis suppurativa 02/26/2020   OSA (obstructive sleep apnea) 02/26/2020   Prediabetes 02/26/2020   Renal disorder    Past Surgical History:  Procedure Laterality Date   ABSCESS DRAINAGE     AV FISTULA PLACEMENT Left 08/21/2021    Procedure: LEFT ARM ARTERIOVENOUS (AV) FISTULA.;  Surgeon: Serafina Mitchell, MD;  Location: [place];  Service: Vascular;  Laterality: Left;   CARDIOVERSION N/A 10/09/2021   Procedure: CARDIOVERSION;  Surgeon: Larey Dresser, MD;  Location: [place];  Service: Cardiovascular;  Laterality: N/A;   IR FLUORO GUIDE CV LINE RIGHT  03/10/2021   IR FLUORO GUIDE CV LINE RIGHT  04/22/2021   IR FLUORO GUIDE CV LINE RIGHT  08/20/2021   IR US GUIDE VASC ACCESS RIGHT  03/10/2021   IR US GUIDE VASC ACCESS RIGHT  04/22/2021   RIGHT HEART CATH N/A 03/06/2021   Procedure: RIGHT HEART CATH;  Surgeon: Jolaine Artist, MD;  Location: [place] CV LAB;  Service: Cardiovascular;  Laterality: N/A;   RIGHT/LEFT HEART CATH AND CORONARY ANGIOGRAPHY N/A 03/04/2020   Procedure: RIGHT/LEFT HEART CATH AND CORONARY ANGIOGRAPHY;  Surgeon: Larey Dresser, MD;  Location: [place] CV LAB;  Service: Cardiovascular;  Laterality: N/A;   TEE WITHOUT CARDIOVERSION N/A 05/05/2021   Procedure: TRANSESOPHAGEAL ECHOCARDIOGRAM (TEE);  Surgeon: Larey Dresser, MD;  Location: [place] ENDOSCOPY;  Service: Cardiovascular;  Laterality: N/A;   TEMPORARY DIALYSIS CATHETER  03/06/2021   Procedure: TEMPORARY DIALYSIS CATHETER;  Surgeon: Jolaine Artist, MD;  Location: [place] CV LAB;  Service: Cardiovascular;;   Family History  Problem Relation Age of Onset   Heart disease Mother    Hypertension Mother    Pulmonary Hypertension Mother    Drug abuse Father        died due to Heroin overdose   Social History:  reports that he quit smoking about 4 years ago. His smoking use included cigarettes. He smoked an average of 1 pack per day. He has been exposed to tobacco smoke. He has quit using smokeless tobacco. He reports that he does not drink alcohol and does not use drugs. Allergies  Allergen Reactions   Coreg [Carvedilol] Shortness Of Breath and Diarrhea    Wheezing    Heparin Other (See Comments)    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  03/05/2021, SRA positive   Prior to Admission medications   Medication Sig Start Date End Date Taking? Authorizing Provider  apixaban (ELIQUIS) 5 MG TABS tablet Take 1 tablet (5 mg total) by mouth 2 (two) times daily. 12/16/21  Yes Larey Dresser, MD  benzonatate (TESSALON) 200 MG capsule Take 1 capsule (200 mg total) by mouth 3 (three) times daily as needed for cough. Patient not taking: Reported on 03/26/2022 09/28/21   Lajean Manes, MD   Current Facility-Administered Medications  Medication Dose Route Frequency Provider Last Rate Last Admin   acetaminophen (TYLENOL) tablet 650 mg  650 mg Oral Q6H PRN Rick Duff, MD       Or   acetaminophen (TYLENOL) suppository 650 mg  650 mg Rectal Q6H PRN Rick Duff, MD       apixaban Arne Cleveland) tablet 5 mg  5 mg Oral BID Rick Duff, MD   5 mg at 03/26/22 9892   benzonatate (TESSALON) capsule 200 mg  200 mg Oral TID PRN Rick Duff, MD       Chlorhexidine Gluconate Cloth 2 % PADS 6 each  6 each Topical Q0600 Dwana Melena, MD       nitroGLYCERIN (NITROGLYN) 2 % ointment 0.5 inch  0.5 inch Topical Once Cardama, Grayce Sessions, MD       ondansetron [facility]) tablet 4 mg  4 mg Oral Q6H PRN Rick Duff, MD       Or   ondansetron [facility]) injection 4 mg  4 mg Intravenous Q6H PRN Rick Duff, MD       Current Outpatient Medications  Medication Sig Dispense Refill   apixaban (ELIQUIS) 5 MG TABS tablet Take 1 tablet (5 mg total) by mouth 2 (two) times daily. 180 tablet 3   benzonatate (TESSALON) 200 MG capsule Take 1 capsule (200 mg total) by mouth 3 (three) times daily as needed for cough. (Patient not taking: Reported on 03/26/2022) 30 capsule 0   Labs: Basic Metabolic Panel: Recent Labs  Lab 03/26/22 0215  NA 136  K 3.5  CL 100  CO2 22  GLUCOSE 111*  BUN 53*  CREATININE 11.10*  CALCIUM 8.9   Liver Function Tests: Recent Labs  Lab 03/26/22 0215  AST 12*  ALT 9  ALKPHOS 87  BILITOT 1.1  PROT 7.7  ALBUMIN 3.7    No results for input(s): "LIPASE", "AMYLASE" in the last 168 hours. No results for input(s): "AMMONIA" in the last 168 hours. CBC: Recent Labs  Lab 03/26/22 0215  WBC 11.2*  NEUTROABS 9.0*  HGB 10.1*  HCT 29.4*  MCV 100.3*  PLT 223   Cardiac Enzymes: No results for input(s): "CKTOTAL", "CKMB", "CKMBINDEX", "TROPONINI" in the last 168 hours. CBG: No results for input(s): "GLUCAP" in the last 168 hours. Iron Studies: No results for input(s): "IRON", "TIBC", "TRANSFERRIN", "FERRITIN" in the last 72 hours. Studies/Results: DG Chest 2 View  Result Date: 03/26/2022 CLINICAL DATA:  Shortness of breath. EXAM: CHEST - 2 VIEW COMPARISON:  02/18/2022. FINDINGS: Heart is enlarged the mediastinal contour  is stable. Pulmonary vasculature is mildly distended. Mild airspace disease is present at the lung bases. No consolidation, effusion, or pneumothorax. A stable right internal jugular dialysis catheter is noted. No acute osseous abnormality IMPRESSION: 1. Cardiomegaly with pulmonary vascular congestion. 2. Mild airspace disease at the lung bases, possible edema or infiltrate. Electronically Signed   By: Brett Fairy M.D.   On: 03/26/2022 02:44   ECHOCARDIOGRAM COMPLETE  Result Date: 03/24/2022    ECHOCARDIOGRAM REPORT   Patient Name:   HARLEE PURSIFULL Date of Exam: 03/24/2022 Medical Rec #:  [id number]      Height:       72.0 in Accession #:    [id number]     Weight:       406.4 lb Date of Birth:  January 23, 1986       BSA:          2.880 m Patient Age:    36 years       BP:           140/90 mmHg Patient Gender: M              HR:           92 bpm. Exam Location:  Outpatient Procedure: 2D Echo, Cardiac Doppler and Color Doppler Indications:    I50.22 CHF  History:        Patient has prior history of Echocardiogram examinations, most                 recent 05/05/2021. CHF, Abnormal ECG, Arrythmias:Atrial                 Fibrillation; Risk Factors:Hypertension, Family History of                 Coronary Artery  Disease, Sleep Apnea and Former Smoker. End                 Stage Renal Disease with Dialysis, Morbid Obesity.  Sonographer:    Deliah Boston RDCS Referring Phys: [place] Comments: No Definity Contrast Per Patient Request IMPRESSIONS  1. Left ventricular ejection fraction, by estimation, is 20 to 25%. The left ventricle has severely decreased function. The left ventricle demonstrates global hypokinesis. The left ventricular internal cavity size was moderately dilated. Left ventricular diastolic parameters were normal.  2. Right ventricular systolic function is normal. The right ventricular size is mildly enlarged. There is severely elevated pulmonary artery systolic pressure. The estimated right ventricular systolic pressure is 38.2 mmHg.  3. Left atrial size was mildly dilated.  4. Right atrial size was mildly dilated.  5. The mitral valve is normal in structure. Mild to moderate mitral valve regurgitation. No evidence of mitral stenosis.  6. The aortic valve is normal in structure. Aortic valve regurgitation is not visualized. No aortic stenosis is present.  7. The inferior vena cava is normal in size with greater than 50% respiratory variability, suggesting right atrial pressure of 3 mmHg. Comparison(s): No significant change from prior study. Prior images reviewed side by side. FINDINGS  Left Ventricle: Left ventricular ejection fraction, by estimation, is 20 to 25%. The left ventricle has severely decreased function. The left ventricle demonstrates global hypokinesis. The left ventricular internal cavity size was moderately dilated. There is no left ventricular hypertrophy. Left ventricular diastolic parameters were normal. Right Ventricle: The right ventricular size is mildly enlarged. No increase in right ventricular wall thickness. Right ventricular systolic function is normal. There is severely elevated pulmonary artery  systolic pressure. The tricuspid regurgitant velocity is  4.11 m/s, and with an assumed right atrial pressure of 3 mmHg, the estimated right ventricular systolic pressure is 52.7 mmHg. Left Atrium: Left atrial size was mildly dilated. Right Atrium: Right atrial size was mildly dilated. Pericardium: There is no evidence of pericardial effusion. Mitral Valve: The mitral valve is normal in structure. Mild to moderate mitral valve regurgitation, with posteriorly-directed jet. No evidence of mitral valve stenosis. Tricuspid Valve: The tricuspid valve is normal in structure. Tricuspid valve regurgitation is trivial. No evidence of tricuspid stenosis. Aortic Valve: The aortic valve is normal in structure. Aortic valve regurgitation is not visualized. No aortic stenosis is present. Pulmonic Valve: The pulmonic valve was normal in structure. Pulmonic valve regurgitation is not visualized. No evidence of pulmonic stenosis. Aorta: The aortic root is normal in size and structure. Venous: The inferior vena cava is normal in size with greater than 50% respiratory variability, suggesting right atrial pressure of 3 mmHg. IAS/Shunts: No atrial level shunt detected by color flow Doppler.  LEFT VENTRICLE PLAX 2D LVIDd:         7.10 cm   Diastology LVIDs:         6.00 cm   LV e' medial:    9.14 cm/s LV PW:         1.20 cm   LV E/e' medial:  10.1 LV IVS:        0.90 cm   LV e' lateral:   9.68 cm/s LVOT diam:     2.50 cm   LV E/e' lateral: 9.5 LV SV:         70 LV SV Index:   24 LVOT Area:     4.91 cm  RIGHT VENTRICLE RV Basal diam:  5.20 cm RV Mid diam:    4.50 cm RV S prime:     11.00 cm/s TAPSE (M-mode): 1.4 cm LEFT ATRIUM              Index        RIGHT ATRIUM           Index LA diam:        4.80 cm  1.67 cm/m   RA Area:     24.00 cm LA Vol (A2C):   99.1 ml  34.41 ml/m  RA Volume:   81.50 ml  28.30 ml/m LA Vol (A4C):   110.0 ml 38.20 ml/m LA Biplane Vol: 108.0 ml 37.50 ml/m  AORTIC VALVE LVOT Vmax:   82.27 cm/s LVOT Vmean:  54.067 cm/s LVOT VTI:    0.143 m  AORTA Ao Root diam: 3.50  cm Ao Asc diam:  2.70 cm MITRAL VALVE               TRICUSPID VALVE MV Area (PHT): cm         TR Peak grad:   67.6 mmHg MV Decel Time: 167 msec    TR Vmax:        411.00 cm/s MV E velocity: 92.05 cm/s MV A velocity: 43.40 cm/s  SHUNTS MV E/A ratio:  2.12        Systemic VTI:  0.14 m                            Systemic Diam: 2.50 cm Candee Furbish MD Electronically signed by Candee Furbish MD Signature Date/Time: 03/24/2022/4:02:00 PM    Final     ROS: As per HPI otherwise negative.  Physical Exam: Vitals:   03/26/22 1154 03/26/22 1200 03/26/22 1206 03/26/22 1215  BP:  (!) 124/90  129/73  Pulse: 82 85  87  Resp: (!) 37 20  13  Temp:   98.9 F (37.2 C)   TempSrc:      SpO2: 99% 100%  100%     General: Well developed, well nourished, in no acute distress. Head: Normocephalic, atraumatic, sclera non-icteric, mucus membranes are moist Neck: Supple. JVD not elevated. Lungs: Bilateral breath sounds essentially clear to ausculation. No WOB.   Heart: HS distant (body habitus) S1,S2 No M/R/G Abdomen: Obese, NABS M-S:  Strength and tone appear normal for age. Lower extremities:without edema or ischemic changes, no open wounds  Neuro: Alert and oriented X 3. Moves all extremities spontaneously. Psych:  Responds to questions appropriately with a normal affect. Dialysis Access: RIJ TDC drsg soiled-being changed.   Dialysis Orders: Center: [facility] T,Th,S 4 hrs 15 min 180NRe 425/Autoflow 1.5 175 kg 3.0 K/2.5 Ca TDC -No heparin. HIT POSITIVE -Hectorol 5 mcg IV TIW -Mircera 75 mcg IV q 2 weeks (last dose 03/04/2022)  -Venofer 50 mg IV q week (Last dose 03/18/2022)  Assessment/Plan:  Acute on Chronic HFrEF-Pulmonary congestion noted on CXR. No WOB. Appears comfortable on O2 @ 4L/M. UF as tolerated.  HFrEF-last EF 20-25%. Follows with Dr. [name]. Manage volume with hemodialysis.   Possible Sepsis: BC pending. Has been started on broad spectrum ABX. He has TDC. Will await BC results. May need TDC exchange.    ESRD -  T.Th,S. Truncated treatments he says D/T work schedule. HD today off schedule and again tomorrow to resume T,Th,S.   Hypertension/volume  - As noted above. On Midodrine 10 mg PO TID.  Anemia  - HGB at goal but no recent ESA. Will dose with HD tomorrow.   Metabolic bone disease -  Continue sensipar, Lanthanum binders, VDRA.   Nutrition - Albumin OK. Renal diet with fluid restrictions.  Goal of Care: Patient says he is thinking about stopping dialysis. Says he is tired. Have discussed situation with primary team who are currently at bedside. Full code for now.    Gracyn Santillanes H. Owens Shark, NP-C 03/26/2022, 12:24 PM  [employer] [phone]

## 2022-03-26 NOTE — ED Notes (Signed)
ED TO INPATIENT HANDOFF REPORT S Name/Age/Gender Phillip Young 36 y.o. male Room/Bed: 039C/039C  Code Status   Code Status: Full Code  Home/SNF/Other Home Patient oriented to: self, place, time, and situation Is this baseline? Yes   Triage Complete: Triage complete  Chief Complaint Acute exacerbation of CHF (congestive heart failure) ([place]) [I50.9]  Triage Note Patient missed his hemodialysis treatment yesterday , reports SOB with fatigue and occasional dry cough .    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, SRA positive    Level of Care/Admitting Diagnosis ED Disposition     ED Disposition  Admit   Condition  --   [place]: [place] [[id number]]  Level of Care: Med-Surg [16]  May admit patient to [facility] or [facility] if equivalent level of care is available:: Yes  Covid Evaluation: Confirmed COVID Negative  Diagnosis: Acute exacerbation of CHF (congestive heart failure) [facility]) [[id number]]  Admitting Physician: Lottie Mussel [[id number]]  Attending Physician: Lottie Mussel [[id number]]  Certification:: I certify this patient will need inpatient services for at least 2 midnights  Estimated Length of Stay: 3          B Medical/Surgery History Past Medical History:  Diagnosis Date   Biventricular congestive heart failure ([place])    Last Echo 11/2019 at [facility] reveals EF 20%   Class 3 severe obesity due to excess calories with serious comorbidity and body mass index (BMI) of 50.0 to 59.9 in adult [facility]) 02/26/2020   Essential hypertension 02/26/2020   GERD without esophagitis 02/26/2020   Hidradenitis suppurativa 02/26/2020   OSA (obstructive sleep apnea) 02/26/2020   Prediabetes 02/26/2020   Renal disorder    Past Surgical History:  Procedure Laterality Date   ABSCESS DRAINAGE     AV FISTULA  PLACEMENT Left 08/21/2021   Procedure: LEFT ARM ARTERIOVENOUS (AV) FISTULA.;  Surgeon: Serafina Mitchell, MD;  Location: [place];  Service: Vascular;  Laterality: Left;   CARDIOVERSION N/A 10/09/2021   Procedure: CARDIOVERSION;  Surgeon: Larey Dresser, MD;  Location: [place];  Service: Cardiovascular;  Laterality: N/A;   IR FLUORO GUIDE CV LINE RIGHT  03/10/2021   IR FLUORO GUIDE CV LINE RIGHT  04/22/2021   IR FLUORO GUIDE CV LINE RIGHT  08/20/2021   IR US GUIDE VASC ACCESS RIGHT  03/10/2021   IR US GUIDE VASC ACCESS RIGHT  04/22/2021   RIGHT HEART CATH N/A 03/06/2021   Procedure: RIGHT HEART CATH;  Surgeon: Jolaine Artist, MD;  Location: [place] [facility];  Service: Cardiovascular;  Laterality: N/A;   RIGHT/LEFT HEART CATH AND CORONARY ANGIOGRAPHY N/A 03/04/2020   Procedure: RIGHT/LEFT HEART CATH AND CORONARY ANGIOGRAPHY;  Surgeon: Larey Dresser, MD;  Location: [place] [facility];  Service: Cardiovascular;  Laterality: N/A;   TEE WITHOUT CARDIOVERSION N/A 05/05/2021   Procedure: TRANSESOPHAGEAL ECHOCARDIOGRAM (TEE);  Surgeon: Larey Dresser, MD;  Location: [facility] ENDOSCOPY;  Service: Cardiovascular;  Laterality: N/A;   TEMPORARY DIALYSIS CATHETER  03/06/2021   Procedure: TEMPORARY DIALYSIS CATHETER;  Surgeon: Jolaine Artist, MD;  Location: [place] [facility];  Service: Cardiovascular;;     A IV Location/Drains/Wounds Patient Lines/Drains/Airways Status     Active Line/Drains/Airways     Name Placement date Placement time Site Days   Peripheral IV 03/26/22 20 G Anterior;Distal;Right;Upper Arm 03/26/22  0430  Arm  less than 1   Fistula / Graft Left Forearm Arteriovenous fistula 08/21/21  1216  Forearm  217   Hemodialysis Catheter Right Internal jugular Double lumen Permanent (Tunneled) 08/20/21  1324  Internal jugular  218            Intake/Output Last 24 hours  Intake/Output Summary (Last 24 hours) at 03/26/2022 1335 Last data filed at 03/26/2022 0804 Gross per 24 hour   Intake 500 ml  Output --  Net 500 ml    Labs/Imaging Results for orders placed or performed during the hospital encounter of 03/26/22 (from the past 48 hour(s))  CBC with Differential     Status: Abnormal   Collection Time: 03/26/22  2:15 AM  Result Value Ref Range   WBC 11.2 (H) 4.0 - 10.5 K/uL   RBC 2.93 (L) 4.22 - 5.81 MIL/uL   Hemoglobin 10.1 (L) 13.0 - 17.0 g/dL   HCT 29.4 (L) 39.0 - 52.0 %   MCV 100.3 (H) 80.0 - 100.0 fL   MCH 34.5 (H) 26.0 - 34.0 pg   MCHC 34.4 30.0 - 36.0 g/dL   RDW 13.4 11.5 - 15.5 %   Platelets 223 150 - 400 K/uL   nRBC 0.0 0.0 - 0.2 %   Neutrophils Relative % 81 %   Neutro Abs 9.0 (H) 1.7 - 7.7 K/uL   Lymphocytes Relative 8 %   Lymphs Abs 0.9 0.7 - 4.0 K/uL   Monocytes Relative 7 %   Monocytes Absolute 0.8 0.1 - 1.0 K/uL   Eosinophils Relative 3 %   Eosinophils Absolute 0.4 0.0 - 0.5 K/uL   Basophils Relative 1 %   Basophils Absolute 0.1 0.0 - 0.1 K/uL   Immature Granulocytes 0 %   Abs Immature Granulocytes 0.03 0.00 - 0.07 K/uL    Comment: Performed at [facility], [address] [place], [place] [address]  Comprehensive metabolic panel     Status: Abnormal   Collection Time: 03/26/22  2:15 AM  Result Value Ref Range   Sodium 136 135 - 145 mmol/L   Potassium 3.5 3.5 - 5.1 mmol/L   Chloride 100 98 - 111 mmol/L   CO2 22 22 - 32 mmol/L   Glucose, Bld 111 (H) 70 - 99 mg/dL    Comment: Glucose reference range applies only to samples taken after fasting for at least 8 hours.   BUN 53 (H) 6 - 20 mg/dL   Creatinine, Ser 11.10 (H) 0.61 - 1.24 mg/dL   Calcium 8.9 8.9 - 10.3 mg/dL   Total Protein 7.7 6.5 - 8.1 g/dL   Albumin 3.7 3.5 - 5.0 g/dL   AST 12 (L) 15 - 41 U/L   ALT 9 0 - 44 U/L   Alkaline Phosphatase 87 38 - 126 U/L   Total Bilirubin 1.1 0.3 - 1.2 mg/dL   GFR, Estimated 6 (L) >60 mL/min    Comment: (NOTE) Calculated using the CKD-EPI Creatinine Equation (2021)    Anion gap 14 5 - 15    Comment: Performed at [facility] [address], [place], [place] [address]  Lactic acid, plasma     Status: None   Collection Time: 03/26/22  3:41 AM  Result Value Ref Range   Lactic Acid, Venous 1.2 0.5 - 1.9 mmol/L    Comment: Performed at [facility] [address], [place], [place] [address]  Protime-INR     Status: None   Collection Time: 03/26/22  3:41 AM  Result Value Ref Range   Prothrombin Time 15.0 11.4 - 15.2 seconds   INR 1.2 0.8 - 1.2    Comment: (NOTE) INR goal varies based on device and disease states. Performed at [facility], [place] [address] [place], [place] [address]   APTT     Status: None   Collection Time: 03/26/22  3:41 AM  Result Value Ref Range   aPTT 31 24 - 36 seconds    Comment: Performed at [facility] [address] [place], [place] [address]  Blood Culture (routine x 2)     Status: None (Preliminary result)   Collection Time: 03/26/22  3:41 AM   Specimen: BLOOD  Result Value Ref Range   Specimen Description BLOOD RIGHT ANTECUBITAL    Special Requests      BOTTLES DRAWN AEROBIC AND ANAEROBIC Blood Culture adequate volume   Culture      NO GROWTH < 12 HOURS Performed at [facility], [place] [address] [place], [place] [address]    Report Status PENDING   Blood Culture (routine x 2)     Status: None (Preliminary result)   Collection Time: 03/26/22  3:41 AM   Specimen: BLOOD RIGHT HAND  Result Value Ref Range   Specimen Description BLOOD RIGHT HAND    Special Requests      BOTTLES DRAWN AEROBIC AND ANAEROBIC Blood Culture adequate volume   Culture      NO GROWTH < 12 HOURS Performed at [facility], [place] [address], [place],  [address]    Report Status PENDING   Resp Panel by RT-PCR (Flu A&B, Covid) Anterior Nasal Swab     Status: None   Collection Time: 03/26/22  3:41 AM   Specimen: Anterior Nasal Swab  Result Value Ref Range   SARS Coronavirus 2 by RT PCR NEGATIVE NEGATIVE    Comment: (NOTE) SARS-CoV-2 target nucleic  acids are NOT DETECTED.  The SARS-CoV-2 RNA is generally detectable in upper respiratory specimens during the acute phase of infection. The lowest concentration of SARS-CoV-2 viral copies this assay can detect is 138 copies/mL. A negative result does not preclude SARS-Cov-2 infection and should not be used as the sole basis for treatment or other patient management decisions. A negative result may occur with  improper specimen collection/handling, submission of specimen other than nasopharyngeal swab, presence of viral mutation(s) within the areas targeted by this assay, and inadequate number of viral copies(<138 copies/mL). A negative result must be combined with clinical observations, patient history, and epidemiological information. The expected result is Negative.  Fact Sheet for Patients:  EntrepreneurPulse.com.au  Fact Sheet for Healthcare Providers:  IncredibleEmployment.be  This test is no t yet approved or cleared by the [place] FDA and  has been authorized for detection and/or diagnosis of SARS-CoV-2 by FDA under an Emergency Use Authorization (EUA). This EUA will remain  in effect (meaning this test can be used) for the duration of the COVID-19 declaration under Section 564(b)(1) of the Act, 21 U.S.C.section 360bbb-3(b)(1), unless the authorization is terminated  or revoked sooner.       Influenza A by PCR NEGATIVE NEGATIVE   Influenza B by PCR NEGATIVE NEGATIVE    Comment: (NOTE) The Xpert Xpress SARS-CoV-2/FLU/RSV plus assay is intended as an aid in the diagnosis of influenza from Nasopharyngeal swab specimens and should not be used as a sole basis for treatment. Nasal washings and aspirates are unacceptable for Xpert Xpress SARS-CoV-2/FLU/RSV  testing.  Fact Sheet for Patients: EntrepreneurPulse.com.au  Fact Sheet for Healthcare Providers: IncredibleEmployment.be  This test is not  yet approved or cleared by the [place] FDA and has been authorized for detection and/or diagnosis of SARS-CoV-2 by FDA under an Emergency Use Authorization (EUA). This EUA will remain in effect (meaning this test can be used) for the duration of the COVID-19 declaration under Section 564(b)(1) of the Act, 21 U.S.C. section 360bbb-3(b)(1), unless the authorization is terminated or revoked.  Performed at [facility], [employer] [address] [place], [place] [address]   Hepatitis B surface antibody     Status: Abnormal   Collection Time: 03/26/22  8:00 AM  Result Value Ref Range   Hep B S Ab Reactive (A) NON REACTIVE    Comment: (NOTE) Consistent with immunity, greater than 9.9 mIU/mL.  Performed at [facility], [place] [address] [place], [place] [address]   Hepatitis B core antibody, total     Status: None   Collection Time: 03/26/22  8:00 AM  Result Value Ref Range   Hep B Core Total Ab NON REACTIVE NON REACTIVE    Comment: Performed at [facility] [address], [place], [place] [address]  Hepatitis C antibody     Status: None   Collection Time: 03/26/22  8:00 AM  Result Value Ref Range   HCV Ab NON REACTIVE NON REACTIVE    Comment: (NOTE) Nonreactive HCV antibody screen is consistent with no HCV infections,  unless recent infection is suspected or other evidence exists to indicate HCV infection.  Performed at [facility], [place] [address], [place], [place] [address]    DG Chest 2 View  Result Date: 03/26/2022 CLINICAL DATA:  Shortness of breath. EXAM: CHEST - 2 VIEW COMPARISON:  02/18/2022. FINDINGS: Heart is enlarged the mediastinal contour is stable. Pulmonary vasculature is mildly distended. Mild airspace disease is present at the lung bases. No consolidation, effusion, or pneumothorax. A stable right internal jugular dialysis catheter is noted. No acute osseous abnormality IMPRESSION: 1. Cardiomegaly with pulmonary vascular congestion. 2. Mild  airspace disease at the lung bases, possible edema or infiltrate. Electronically Signed   By: Brett Fairy M.D.   On: 03/26/2022 02:44   ECHOCARDIOGRAM COMPLETE  Result Date: 03/24/2022    ECHOCARDIOGRAM REPORT   Patient Name:   Phillip Young Date of Exam: 03/24/2022 Medical Rec #:  [id number]      Height:       72.0 in Accession #:    [id number]     Weight:       406.4 lb Date of Birth:  1985-12-14       BSA:          2.880 m Patient Age:    36 years       BP:           140/90 mmHg Patient Gender: M              HR:           92 bpm. Exam Location:  Outpatient Procedure: 2D Echo, Cardiac Doppler and Color Doppler Indications:    I50.22 CHF  History:        Patient has prior history of Echocardiogram examinations, most                 recent 05/05/2021. CHF, Abnormal ECG, Arrythmias:Atrial  Fibrillation; Risk Factors:Hypertension, Family History of                 Coronary Artery Disease, Sleep Apnea and Former Smoker. End                 Stage Renal Disease with Dialysis, Morbid Obesity.  Sonographer:    Deliah Boston RDCS Referring Phys: [place] Comments: No Definity Contrast Per Patient Request IMPRESSIONS  1. Left ventricular ejection fraction, by estimation, is 20 to 25%. The left ventricle has severely decreased function. The left ventricle demonstrates global hypokinesis. The left ventricular internal cavity size was moderately dilated. Left ventricular diastolic parameters were normal.  2. Right ventricular systolic function is normal. The right ventricular size is mildly enlarged. There is severely elevated pulmonary artery systolic pressure. The estimated right ventricular systolic pressure is 43.3 mmHg.  3. Left atrial size was mildly dilated.  4. Right atrial size was mildly dilated.  5. The mitral valve is normal in structure. Mild to moderate mitral valve regurgitation. No evidence of mitral stenosis.  6. The aortic valve is normal in structure. Aortic  valve regurgitation is not visualized. No aortic stenosis is present.  7. The inferior vena cava is normal in size with greater than 50% respiratory variability, suggesting right atrial pressure of 3 mmHg. Comparison(s): No significant change from prior study. Prior images reviewed side by side. FINDINGS  Left Ventricle: Left ventricular ejection fraction, by estimation, is 20 to 25%. The left ventricle has severely decreased function. The left ventricle demonstrates global hypokinesis. The left ventricular internal cavity size was moderately dilated. There is no left ventricular hypertrophy. Left ventricular diastolic parameters were normal. Right Ventricle: The right ventricular size is mildly enlarged. No increase in right ventricular wall thickness. Right ventricular systolic function is normal. There is severely elevated pulmonary artery systolic pressure. The tricuspid regurgitant velocity is 4.11 m/s, and with an assumed right atrial pressure of 3 mmHg, the estimated right ventricular systolic pressure is 29.5 mmHg. Left Atrium: Left atrial size was mildly dilated. Right Atrium: Right atrial size was mildly dilated. Pericardium: There is no evidence of pericardial effusion. Mitral Valve: The mitral valve is normal in structure. Mild to moderate mitral valve regurgitation, with posteriorly-directed jet. No evidence of mitral valve stenosis. Tricuspid Valve: The tricuspid valve is normal in structure. Tricuspid valve regurgitation is trivial. No evidence of tricuspid stenosis. Aortic Valve: The aortic valve is normal in structure. Aortic valve regurgitation is not visualized. No aortic stenosis is present. Pulmonic Valve: The pulmonic valve was normal in structure. Pulmonic valve regurgitation is not visualized. No evidence of pulmonic stenosis. Aorta: The aortic root is normal in size and structure. Venous: The inferior vena cava is normal in size with greater than 50% respiratory variability, suggesting right  atrial pressure of 3 mmHg. IAS/Shunts: No atrial level shunt detected by color flow Doppler.  LEFT VENTRICLE PLAX 2D LVIDd:         7.10 cm   Diastology LVIDs:         6.00 cm   LV e' medial:    9.14 cm/s LV PW:         1.20 cm   LV E/e' medial:  10.1 LV IVS:        0.90 cm   LV e' lateral:   9.68 cm/s LVOT diam:     2.50 cm   LV E/e' lateral: 9.5 LV SV:  70 LV SV Index:   24 LVOT Area:     4.91 cm  RIGHT VENTRICLE RV Basal diam:  5.20 cm RV Mid diam:    4.50 cm RV S prime:     11.00 cm/s TAPSE (M-mode): 1.4 cm LEFT ATRIUM              Index        RIGHT ATRIUM           Index LA diam:        4.80 cm  1.67 cm/m   RA Area:     24.00 cm LA Vol (A2C):   99.1 ml  34.41 ml/m  RA Volume:   81.50 ml  28.30 ml/m LA Vol (A4C):   110.0 ml 38.20 ml/m LA Biplane Vol: 108.0 ml 37.50 ml/m  AORTIC VALVE LVOT Vmax:   82.27 cm/s LVOT Vmean:  54.067 cm/s LVOT VTI:    0.143 m  AORTA Ao Root diam: 3.50 cm Ao Asc diam:  2.70 cm MITRAL VALVE               TRICUSPID VALVE MV Area (PHT): cm         TR Peak grad:   67.6 mmHg MV Decel Time: 167 msec    TR Vmax:        411.00 cm/s MV E velocity: 92.05 cm/s MV A velocity: 43.40 cm/s  SHUNTS MV E/A ratio:  2.12        Systemic VTI:  0.14 m                            Systemic Diam: 2.50 cm Candee Furbish MD Electronically signed by Candee Furbish MD Signature Date/Time: 03/24/2022/4:02:00 PM    Final     Pending Labs Unresulted Labs (From admission, onward)     Start     Ordered   03/27/22 0500  Comprehensive metabolic panel  Tomorrow morning,   R        03/26/22 0830   03/27/22 0500  CBC  Tomorrow morning,   R        03/26/22 0830   03/26/22 0914  Brain natriuretic peptide  Once,   R        03/26/22 0913   03/26/22 0839  Blood gas, venous  Once,   R        03/26/22 0838   03/26/22 0743  Hepatitis B surface antigen  (New Admission Hemo Labs (Hepatitis B))  Once,   URGENT        03/26/22 0743   03/26/22 0743  Hepatitis B surface antibody,quantitative  (New Admission Hemo  Labs (Hepatitis B))  Once,   URGENT        03/26/22 0743   03/26/22 0308  Urinalysis, Routine w reflex microscopic  (Undifferentiated presentation (screening labs and basic nursing orders))  ONCE - URGENT,   URGENT        03/26/22 0308   Signed and Held  Renal function panel  Once,   R        Signed and Held   Signed and Held  CBC  Once,   R        Signed and Held            Vitals/Pain Today's Vitals   03/26/22 1215 03/26/22 1230 03/26/22 1245 03/26/22 1312  BP: 129/73 (!) 130/97 134/89 111/78  Pulse: 87 84 89 89  Resp: 13 (!)  22 20 (!) 32  Temp:   98.1 F (36.7 C)   TempSrc:   Oral   SpO2: 100% 100% 100% 100%  PainSc:        Isolation Precautions No active isolations  Medications Medications  nitroGLYCERIN (NITROGLYN) 2 % ointment 0.5 inch (0.5 inches Topical Not Given 03/26/22 0653)  Chlorhexidine Gluconate Cloth 2 % PADS 6 each (has no administration in time range)  acetaminophen (TYLENOL) tablet 650 mg (has no administration in time range)    Or  acetaminophen (TYLENOL) suppository 650 mg (has no administration in time range)  ondansetron (ZOFRAN) tablet 4 mg (has no administration in time range)    Or  ondansetron (ZOFRAN) injection 4 mg (has no administration in time range)  apixaban (ELIQUIS) tablet 5 mg (5 mg Oral Given 03/26/22 0909)  benzonatate (TESSALON) capsule 200 mg (has no administration in time range)  vancomycin (VANCOCIN) IVPB 1000 mg/200 mL premix (has no administration in time range)  vancomycin variable dose per unstable renal function (pharmacist dosing) (has no administration in time range)  acetaminophen (TYLENOL) tablet 1,000 mg (1,000 mg Oral Given 03/26/22 0355)  ceFEPIme (MAXIPIME) 2 g in sodium chloride 0.9 % 100 mL IVPB (0 g Intravenous Stopped 03/26/22 0552)  vancomycin (VANCOREADY) IVPB 2000 mg/400 mL (0 mg Intravenous Stopped 03/26/22 0804)    Mobility walks Low fall risk   Focused Assessments Pulmonary Assessment Handoff:  Lung  sounds:   O2 Device: Nasal Cannula O2 Flow Rate (L/min): 4 L/min    R Recommendations: See Admitting Provider Note
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
## 2022-03-26 NOTE — ED Provider Notes (Signed)
Phillip Young, Phillip MRSA infection in 2021 seeding into the SI joints and surrounding musculature that was treated with IV antibiotics.  He is here today for gradually worsening shortness of breath and myalgias throughout the day.  He reports going to his dialysis center on Tuesday until that have the machines or nonfunctional due to a water issue.  He waited around to the afternoon session but they were unable to get him in.  He is endorsing increased cough which exacerbates his shortness of breath.  He has dyspnea on exertion and generalized fatigue.  He endorses nausea with nonbloody nonbilious emesis.  No abdominal pain.  No diarrhea.  The history is provided by the patient.    Past Medical History Past Medical History:  Diagnosis Date   Biventricular congestive heart failure ([place])    Last Echo 11/2019 at [facility] reveals EF 20%   Class 3 severe obesity due to excess calories with serious comorbidity and body mass index (BMI) of 50.0 to 59.9 in adult [facility]) 02/26/2020   Essential hypertension 02/26/2020   GERD without esophagitis 02/26/2020   Hidradenitis suppurativa 02/26/2020   OSA (obstructive sleep apnea) 02/26/2020   Prediabetes 02/26/2020   Renal disorder    Patient Active Problem List   Diagnosis Date Noted   Acute hypoxemic respiratory failure ([place]) 10/09/2021   Acute respiratory failure ([place]) 10/09/2021   Recurrent infections 09/30/2021   Other fatigue 09/30/2021   Pruritus 09/30/2021   Vomiting 09/28/2021   Pruritic rash 09/17/2021   Reactive depression 09/02/2021   Hypoxemia 09/02/2021   Abscess of iliac fossa 08/26/2021   Staphylococcal arthritis ([place])  08/26/2021   Healthcare maintenance 08/26/2021   Sacral osteomyelitis ([place]) 08/26/2021   Obesity 08/26/2021   Volume overload 08/18/2021   ESRD on dialysis [facility])    Palliative care by specialist    Full code status    Concern about end of life    Acute on chronic respiratory failure with hypoxia ([place]) 04/21/2021   Medical non-compliance 04/21/2021   Hypotension 04/21/2021   MSSA bacteremia    ESRD (end stage renal disease) on dialysis ([place]) 04/16/2021   ESRD (end stage renal disease) ([place]) 04/12/2021   Paroxysmal atrial flutter ([place]) 04/12/2021   Acute respiratory failure with hypoxia ([place]) 04/12/2021   Hyponatremia 04/12/2021   Dyspnea    Cardiogenic shock ([name])    Atrial flutter with rapid ventricular response ([place]) 02/11/2021   Elevated troponin 02/11/2021   Chronic respiratory failure with hypoxia ([place]) 02/11/2021   CAP (community acquired pneumonia) 58/85/0277   Chronic systolic CHF (congestive heart failure) ([name])    Hypokalemia    CHF (congestive heart failure) ([name])    Chronic cough    Acute on chronic systolic (congestive) heart failure ([place]) 02/26/2020   Morbid obesity ([place]) 02/26/2020   GERD without esophagitis 02/26/2020   Diuretic-induced hypokalemia 02/26/2020   OSA (obstructive sleep apnea) 02/26/2020   Hidradenitis suppurativa 02/26/2020   Prediabetes 02/26/2020   Home Medication(s) Phillip to Admission medications   Medication Sig Start Date End Date Taking? Authorizing Provider  apixaban (ELIQUIS) 5 MG TABS tablet Take 1 tablet (5 mg total) by mouth 2 (two) times daily. 12/16/21  Yes Larey Dresser, MD  benzonatate (  TESSALON) 200 MG capsule Take 1 capsule (200 mg total) by mouth 3 (three) times daily as needed for cough. Patient not taking: Reported on 03/26/2022 09/28/21   Lajean Manes, MD                                                                                                                                    Allergies Coreg [carvedilol] and  Heparin  Review of Systems Review of Systems As noted in HPI  Physical Exam Vital Signs  I have reviewed the triage vital signs BP 120/81   Pulse 83   Temp 98.6 F (37 C) (Oral)   Resp (!) 34   SpO2 97%   Physical Exam Vitals reviewed.  Constitutional:      General: He is not in acute distress.    Appearance: He is well-developed. He is obese. He is not diaphoretic.  HENT:     Head: Normocephalic and atraumatic.     Nose: Nose normal.  Eyes:     General: No scleral icterus.       Right eye: No discharge.        Left eye: No discharge.     Conjunctiva/sclera: Conjunctivae normal.     Pupils: Pupils are equal, round, and reactive to light.  Cardiovascular:     Rate and Rhythm: Normal rate and regular rhythm.     Heart sounds: No murmur heard.    No friction rub. No gallop.  Pulmonary:     Effort: Tachypnea present. No respiratory distress.     Breath sounds: No stridor, decreased air movement or transmitted upper airway sounds. Examination of the right-lower field reveals rales. Examination of the left-lower field reveals rales. Rales (fine) present. No wheezing or rhonchi.  Abdominal:     General: There is no distension.     Palpations: Abdomen is soft.     Tenderness: There is no abdominal tenderness.  Musculoskeletal:        General: No tenderness.     Cervical back: Normal range of motion and neck supple.     Right lower leg: Edema (trace) present.     Left lower leg: Edema (trace) present.  Skin:    General: Skin is warm and dry.     Findings: No erythema or rash.  Neurological:     Mental Status: He is alert and oriented to person, place, and time.     ED Results and Treatments Labs (all labs ordered are listed, but only abnormal results are displayed) Labs Reviewed  CBC WITH DIFFERENTIAL/PLATELET - Abnormal; Notable for the following components:      Result Value   WBC 11.2 (*)    RBC 2.93 (*)    Hemoglobin 10.1 (*)    HCT 29.4 (*)    MCV 100.3 (*)     MCH 34.5 (*)    Neutro Abs 9.0 (*)    All other components within  normal limits  COMPREHENSIVE METABOLIC PANEL - Abnormal; Notable for the following components:   Glucose, Bld 111 (*)    BUN 53 (*)    Creatinine, Ser 11.10 (*)    AST 12 (*)    GFR, Estimated 6 (*)    All other components within normal limits  CULTURE, BLOOD (ROUTINE X 2)  CULTURE, BLOOD (ROUTINE X 2)  RESP PANEL BY RT-PCR (FLU A&B, COVID) ARPGX2  LACTIC ACID, PLASMA  PROTIME-INR  APTT  URINALYSIS, ROUTINE W REFLEX MICROSCOPIC                                                                                                                         EKG  EKG Interpretation  Date/Time:  Friday March 26 2022 02:06:39 EDT Ventricular Rate:  95 PR Interval:  202 QRS Duration: 94 QT Interval:  408 QTC Calculation: 512 R Axis:   95 Text Interpretation: Normal sinus rhythm Possible Left atrial enlargement Rightward axis Nonspecific T wave abnormality Prolonged QT Abnormal ECG When compared with ECG of 18-Feb-2022 01:41, PREVIOUS ECG IS PRESENT Confirmed by Addison Lank [phone]) on 03/26/2022 4:35:52 AM       Radiology DG Chest 2 View  Result Date: 03/26/2022 CLINICAL DATA:  Shortness of breath. EXAM: CHEST - 2 VIEW COMPARISON:  02/18/2022. FINDINGS: Heart is enlarged the mediastinal contour is stable. Pulmonary vasculature is mildly distended. Mild airspace disease is present at the lung bases. No consolidation, effusion, or pneumothorax. A stable right internal jugular dialysis catheter is noted. No acute osseous abnormality IMPRESSION: 1. Cardiomegaly with pulmonary vascular congestion. 2. Mild airspace disease at the lung bases, possible edema or infiltrate. Electronically Signed   By: Brett Fairy M.D.   On: 03/26/2022 02:44    Medications Ordered in ED Medications  vancomycin (VANCOREADY) IVPB 2000 mg/400 mL (2,000 mg Intravenous New Bag/Given 03/26/22 0550)  nitroGLYCERIN (NITROGLYN) 2 % ointment 0.5 inch (0.5  inches Topical Not Given 03/26/22 0653)  acetaminophen (TYLENOL) tablet 1,000 mg (1,000 mg Oral Given 03/26/22 0355)  ceFEPIme (MAXIPIME) 2 g in sodium chloride 0.9 % 100 mL IVPB (0 g Intravenous Stopped 03/26/22 0552)                                                                                                                                     Procedures .Critical Care  Performed by: Fatima Blank, MD Authorized by:  Fatima Blank, MD   Critical care provider statement:    Critical care time (minutes):  45   Critical care time was exclusive of:  Separately billable procedures and treating other patients   Critical care was necessary to treat or prevent imminent or life-threatening deterioration of the following conditions:  Sepsis   Critical care was time spent personally by me on the following activities:  Development of treatment plan with patient or surrogate, discussions with consultants, evaluation of patient's response to treatment, examination of patient, obtaining history from patient or surrogate, review of old charts, re-evaluation of patient's condition, pulse oximetry, ordering and review of radiographic studies, ordering and review of laboratory studies and ordering and performing treatments and interventions   Care discussed with: admitting provider     (including critical care time)  Medical Decision Making / ED Course   Medical Decision Making Amount and/or Complexity of Data Reviewed Labs: ordered. Decision-making details documented in ED Course. Radiology: ordered and independent interpretation performed. Decision-making details documented in ED Course. ECG/medicine tests: ordered and independent interpretation performed. Decision-making details documented in ED Course.  Risk OTC drugs. Prescription drug management. Decision regarding hospitalization.   Patient presents with cough, shortness of breath and myalgias. Missed dialysis. Patient has  sats in the low 90s.  Placed on 2 L nasal cannula Noted to have low-grade fever of 100.5 orally. Infectious work-up ordered.  CBC with leukocytosis and mild anemia close to his baseline Metabolic panel without significant electrolyte derangements.  Consistent with ESRD. COVID/influenza negative Lactic acid normal INR normal Blood cultures obtained given his Phillip history of MRSA.  Chest x-ray with evidence of cardiomegaly and pulmonary vascular congestion with bibasilar opacities concerning for infiltrate versus edema.  Given the patient's fever, he was started on empiric antibiotics.  Case discussed with internal medicine who will admit for further work-up and management. I consulted Dr. Royce Macadamia from nephrology who will coordinate dialysis for patient later on today.    Final Clinical Impression(s) / ED Diagnoses Final diagnoses:  Sepsis with acute hypoxic respiratory failure without septic shock, due to unspecified organism [facility])           This chart was dictated using voice recognition software.  Despite best efforts to proofread,  errors can occur which can change the documentation meaning.    Fatima Blank, MD 03/26/22 [phone]

## 2022-03-26 NOTE — Progress Notes (Addendum)
Patient's HD machine is keep on beeping,arterial  limb catheter have a very sluggish pull when we gave a power flushing,it went went on  smoothly temporarily,but  needs to change cartridge due to the present of large blood clot in air chamber.Resumed treatment  but after  30 minutes ,HD machine shutdown and ended treatment by itself.Set up a new HD machine to continue treatment but after an hour ,The second HD machine shutdown itself.CN made aware and at the same time ,patient ,was wanting out to end the HD treatment.R.N.explained to patient ,that he still having SOB,while the nurse manually returned the blood into the patient's system.Nurse explained to to patient that he needs to sign Early Termination HD treatment,if he wants to end treatment earlyPatient refused initially  and he said that '' why I am going to sign ,the machine is not workingPublic librarian responded ''i can have another machine set -up for you and the patient said ''i understand '' then he signed . Patient had a total of 2.30 hrs of treatment with a UF of 1.3 L.Unable to give antibiotic due to abrupt ending of HD treatment .Floor nurse made aware.Renal on call made aware.

## 2022-03-26 NOTE — ED Triage Notes (Signed)
Patient missed his hemodialysis treatment yesterday , reports SOB with fatigue and occasional dry cough .

## 2022-03-26 NOTE — Progress Notes (Signed)
Admitting medical team at the patient's bedside.

## 2022-03-26 NOTE — Progress Notes (Signed)
Pharmacy Antibiotic Note  Phillip Young is a 36 y.o. male admitted on 03/26/2022 with sepsis.  Pharmacy has been consulted for vancomycin dosing.  Hx of ESRD. WBC 11.2, LA 1.2, afebrile. Has hx of MRSA infection. Received 2g IV vancomycin on 9/1@0550 . Last charted weight per records on 03/25/2022 shows 179 kg.   Plan: Order vancomyicn 1g IV with HD Monitor cx results, clinical pic, and LOT  Will order scheduled with HD dosing - on TTS PTA      Temp (24hrs), Avg:99.4 F (37.4 C), Min:98.6 F (37 C), Max:100.5 F (38.1 C)  Recent Labs  Lab 03/26/22 0215 03/26/22 0341  WBC 11.2*  --   CREATININE 11.10*  --   LATICACIDVEN  --  1.2    CrCl cannot be calculated (Unknown ideal weight.).    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  Antonietta Jewel, PharmD, [place] Clinical Pharmacist  Phone: [phone] 03/26/2022 1:02 PM  Please check AMION for all [place] phone numbers After 10:00 PM, call [place] [phone]

## 2022-03-27 DIAGNOSIS — I5023 Acute on chronic systolic (congestive) heart failure: Secondary | ICD-10-CM | POA: Diagnosis not present

## 2022-03-27 DIAGNOSIS — Z992 Dependence on renal dialysis: Secondary | ICD-10-CM | POA: Diagnosis not present

## 2022-03-27 DIAGNOSIS — N186 End stage renal disease: Secondary | ICD-10-CM | POA: Diagnosis not present

## 2022-03-27 LAB — COMPREHENSIVE METABOLIC PANEL
ALT: 11 U/L (ref 0–44)
AST: 15 U/L (ref 15–41)
Albumin: 3.2 g/dL — ABNORMAL LOW (ref 3.5–5.0)
Alkaline Phosphatase: 76 U/L (ref 38–126)
Anion gap: 14 (ref 5–15)
BUN: 44 mg/dL — ABNORMAL HIGH (ref 6–20)
CO2: 24 mmol/L (ref 22–32)
Calcium: 8.4 mg/dL — ABNORMAL LOW (ref 8.9–10.3)
Chloride: 99 mmol/L (ref 98–111)
Creatinine, Ser: 10.03 mg/dL — ABNORMAL HIGH (ref 0.61–1.24)
GFR, Estimated: 6 mL/min — ABNORMAL LOW (ref >60)
Glucose, Bld: 94 mg/dL (ref 70–99)
Potassium: 3.7 mmol/L (ref 3.5–5.1)
Sodium: 137 mmol/L (ref 135–145)
Total Bilirubin: 1.1 mg/dL (ref 0.3–1.2)
Total Protein: 7 g/dL (ref 6.5–8.1)

## 2022-03-27 LAB — RESPIRATORY PANEL BY PCR

## 2022-03-27 LAB — CBC
HCT: 26 % — ABNORMAL LOW (ref 39.0–52.0)
Hemoglobin: 9.1 g/dL — ABNORMAL LOW (ref 13.0–17.0)
MCH: 34.6 pg — ABNORMAL HIGH (ref 26.0–34.0)
MCHC: 35 g/dL (ref 30.0–36.0)
MCV: 98.9 fL (ref 80.0–100.0)
Platelets: 197 10*3/uL (ref 150–400)
RBC: 2.63 MIL/uL — ABNORMAL LOW (ref 4.22–5.81)
RDW: 13.2 % (ref 11.5–15.5)
WBC: 7.6 10*3/uL (ref 4.0–10.5)
nRBC: 0 % (ref 0.0–0.2)

## 2022-03-27 LAB — HEPATITIS B SURFACE ANTIBODY, QUANTITATIVE: Hep B S AB Quant (Post): 283.9 m[IU]/mL (ref >9.9)

## 2022-03-27 MED ORDER — ACETAMINOPHEN 500 MG PO TABS
1000.0000 mg | ORAL_TABLET | Freq: Three times a day (TID) | ORAL | Status: DC
  Administered 2022-03-27 – 2022-03-28 (×2): 1000 mg via ORAL
  Filled 2022-03-27 (×2): qty 2

## 2022-03-27 MED ORDER — VANCOMYCIN HCL 750 MG/150ML IV SOLN
750.0000 mg | Freq: Once | INTRAVENOUS | Status: AC
  Administered 2022-03-27: 750 mg via INTRAVENOUS
  Filled 2022-03-27: qty 150

## 2022-03-27 MED ORDER — ANTICOAGULANT SODIUM CITRATE 4% (200MG/5ML) IV SOLN
5.0000 mL | Freq: Once | Status: AC
  Administered 2022-03-27: 5 mL

## 2022-03-27 MED ORDER — VANCOMYCIN HCL IN DEXTROSE 1-5 GM/200ML-% IV SOLN: 1000.0000 mg | INTRAVENOUS | Status: DC

## 2022-03-27 MED ORDER — TORSEMIDE 100 MG PO TABS
100.0000 mg | ORAL_TABLET | ORAL | Status: DC
  Administered 2022-03-28: 100 mg via ORAL
  Filled 2022-03-27: qty 1

## 2022-03-27 MED ORDER — HYDROMORPHONE HCL 2 MG PO TABS
1.0000 mg | ORAL_TABLET | Freq: Once | ORAL | Status: AC
  Administered 2022-03-27: 1 mg via ORAL
  Filled 2022-03-27: qty 1

## 2022-03-27 MED ORDER — DICLOFENAC SODIUM 1 % EX GEL
4.0000 g | Freq: Four times a day (QID) | CUTANEOUS | Status: DC
  Administered 2022-03-27 (×2): 4 g via TOPICAL
  Filled 2022-03-27: qty 100

## 2022-03-27 NOTE — Progress Notes (Signed)
Subjective: Patient evaluated at the bedside this AM. He is feeling similar to yesterday with persistent headache and joint pain. Still having chronic cough related to volume overload from ESRD. Endorses some Nausea/vomiting chronically secondary to his cough. Does not want to be seen by PT as he says he can do everything by himself.  Objective:  Vital signs in last 24 hours: Vitals:   03/27/22 1203 03/27/22 1230 03/27/22 1300 03/27/22 1314  BP: 102/71 122/64 (!) 113/53 108/73  Pulse: 85 85 83 86  Resp: 15 20 13  (!) 29  Temp:      TempSrc:      SpO2: 100% 100% 100% 100%  Weight:       Physical Exam Gen: obese, chronically ill appearing, laying in bed CV: RRR, no m/r/g, normal s1/s2 with distant heart sounds Pulm: Normal work of breathing, LCTAB at the anterior listening posts but breath sounds distant due to body habitus, 4L [place] in place MSK: No bony pain to palpation of the spine, no erythema or swelling of the knee, ankle,hand joints Extremities: warm, dry, no peripheral edema  Assessment/Plan:  Principal Problem:   Acute exacerbation of CHF (congestive heart failure) ([name]) Phillip Young is a 36 yo M with a PMH of HFrEF 2/2 NICM (EF 20 to 25%), ESRD on HD (TThS) via R tunneled cath c/b AOCD, OSA/OHS, atrial fibrillation s/p TEE/DCCV (10/22/2021) on chronic AC who presents with acute on chronic dyspnea in the setting of missed HD session found to be febrile with leukocytosis c/f line infection.    Acute febrile flu-like illness During hospitalization 03/2021 patient noted to have MSSA bacteremia c/b Left SI septic arthritis, sacrum and iliac bone osteomyelitis, abscess left iliacus muscle, small abscess L piriformis. Felt his headache, fatigue, myalgias and arthralgias on this admission could be related to this. Peripheral bcx and central line bcx currently negative. No evidence of local cellulitis around the line. No evidence of infection on chest imaging and RPP negative. No  abdominal symptoms or pain to suggest that as a reason for fever and white count elevation. Symptoms seem like a viral flu like illness, although the patient has not had fevers and had resolving WBC count after antibiotics. Given no growth on cultures, no valvular abnormalities on TEE, no murmurs on exam or evidence of peripheral embolic sequelae, no bony pain to palpation, no joint swelling or inflammation, do not think this is MSSA bacteremia with seeding of joints and is more so a respiratory virus or could be parvovirus. Joint pain could also be in the setting of ESRD, although calcium is normal.Will continue to follow cultures and remain on broad spectrum antibiotics given high risk ESRD patient, but will plan to stop if cultures remain negative. -Continue IV vancomycin and ceftriaxone -F/u cultures -Daily CBC   ESRD c/b AOCD Patient dyspneic in the setting of missed HD session on Wednesday. Was able to get a partial session on Friday after machine malfunction and patient not wanting to resume. Getting another HD session today. -Torsemide 100mg  [facility]   HFrEF (EF 25-30%)NICM  He does not take any medications currently as he believes his heart failure medications precipitated his kidney failure. His EF has been stable since diagnosis in 2019. Will continue volume control with HD.   Atrial fibrillation s/p TEE/DCCV -Continue home eliquis    OSA/OHS. -Continue home BiPAP   GOC Regarding his ESRD and HD. Patient states that he knows that he has been told if he is off of HD  he could die within days to weeks. However, if he does have a line infection and his tunneled HD cathter has to be removed, he would like to see how long he could go without getting dialysis before he becomes symptomatic. He feels that he continues to make urine and he may not need to go back on HD.  Prior to Admission Living Arrangement: Anticipated Discharge Location: Barriers to Discharge: Dispo: Anticipated discharge in  approximately 2 day(s).   Phillip Coach, MD 03/27/2022, 1:33 PM Pager: [phone] After 5pm on weekdays and 1pm on weekends: On Call pager [phone]

## 2022-03-27 NOTE — Plan of Care (Signed)
  Problem: Education: Goal: Knowledge of General Education information will improve Description: Including pain rating scale, medication(s)/side effects and non-pharmacologic comfort measures Outcome: Completed/Met   Problem: Health Behavior/Discharge Planning: Goal: Ability to manage health-related needs will improve Outcome: Completed/Met   Problem: Clinical Measurements: Goal: Ability to maintain clinical measurements within normal limits will improve Outcome: Progressing Goal: Will remain free from infection Outcome: Progressing Goal: Diagnostic test results will improve Outcome: Progressing Goal: Respiratory complications will improve Outcome: Progressing Goal: Cardiovascular complication will be avoided Outcome: Progressing   Problem: Activity: Goal: Risk for activity intolerance will decrease Outcome: Progressing   Problem: Nutrition: Goal: Adequate nutrition will be maintained Outcome: Progressing   Problem: Coping: Goal: Level of anxiety will decrease Outcome: Progressing   Problem: Elimination: Goal: Will not experience complications related to bowel motility Outcome: Progressing Goal: Will not experience complications related to urinary retention Outcome: Progressing

## 2022-03-27 NOTE — Progress Notes (Addendum)
MEWS Yellow for respirations VS rechecked pain 8/10 generalized pain 1000mg  Tylenol po given Dr. Jodi Mourning paged for the MEWS and informed that pain not resolved with Tylenol. Santiago Glad Agricultural consultant also informed of the MEWS

## 2022-03-27 NOTE — Progress Notes (Signed)
[facility] Progress Note   Subjective: Signed off HD yesterday after 2.5 hours. HD again today to resume T,Th, S schedule.   Objective Vitals:   03/27/22 0756 03/27/22 0900 03/27/22 0930 03/27/22 0936  BP: (!) 99/59 119/72  115/62  Pulse: 89 90 89 88  Resp: 18 17 (!) 29 (!) 28  Temp: 98.8 F (37.1 C) 98.6 F (37 C)    TempSrc: Oral Oral    SpO2: 100% 100% 100% 100%  Weight:       General: Well developed, well nourished, in no acute distress. Lungs: Bilateral breath sounds essentially clear to ausculation. No WOB.   Heart: HS distant (body habitus) S1,S2 No M/R/G Abdomen: Obese, NABS Lower extremities:No LE edema Dialysis Access: RIJ TDC blood lines connected    Additional Objective Labs: Basic Metabolic Panel: Recent Labs  Lab 03/26/22 0215 03/27/22 0300  NA 136 137  K 3.5 3.7  CL 100 99  CO2 22 24  GLUCOSE 111* 94  BUN 53* 44*  CREATININE 11.10* 10.03*  CALCIUM 8.9 8.4*   Liver Function Tests: Recent Labs  Lab 03/26/22 0215 03/27/22 0300  AST 12* 15  ALT 9 11  ALKPHOS 87 76  BILITOT 1.1 1.1  PROT 7.7 7.0  ALBUMIN 3.7 3.2*   No results for input(s): "LIPASE", "AMYLASE" in the last 168 hours. CBC: Recent Labs  Lab 03/26/22 0215 03/27/22 0300  WBC 11.2* 7.6  NEUTROABS 9.0*  --   HGB 10.1* 9.1*  HCT 29.4* 26.0*  MCV 100.3* 98.9  PLT 223 197   Blood Culture    Component Value Date/Time   SDES BLOOD RIGHT ANTECUBITAL 03/26/2022 0341   SDES BLOOD RIGHT HAND 03/26/2022 0341   SPECREQUEST  03/26/2022 0341    BOTTLES DRAWN AEROBIC AND ANAEROBIC Blood Culture adequate volume   SPECREQUEST  03/26/2022 0341    BOTTLES DRAWN AEROBIC AND ANAEROBIC Blood Culture adequate volume   CULT  03/26/2022 0341    NO GROWTH < 12 HOURS Performed at [facility] [address], [place], [place] [address]    CULT  03/26/2022 0341    NO GROWTH < 12 HOURS Performed at [facility], [place] [address] [place], [place] [address]     REPTSTATUS PENDING 03/26/2022 0341   REPTSTATUS PENDING 03/26/2022 0341    Cardiac Enzymes: No results for input(s): "CKTOTAL", "CKMB", "CKMBINDEX", "TROPONINI" in the last 168 hours. CBG: No results for input(s): "GLUCAP" in the last 168 hours. Iron Studies:  Recent Labs    03/26/22 1958  IRON 27*  TIBC 321  FERRITIN 370*   @lablastinr3 @ Studies/Results: DG Chest 2 View  Result Date: 03/26/2022 CLINICAL DATA:  Shortness of breath. EXAM: CHEST - 2 VIEW COMPARISON:  02/18/2022. FINDINGS: Heart is enlarged the mediastinal contour is stable. Pulmonary vasculature is mildly distended. Mild airspace disease is present at the lung bases. No consolidation, effusion, or pneumothorax. A stable right internal jugular dialysis catheter is noted. No acute osseous abnormality IMPRESSION: 1. Cardiomegaly with pulmonary vascular congestion. 2. Mild airspace disease at the lung bases, possible edema or infiltrate. Electronically Signed   By: Brett Fairy M.D.   On: 03/26/2022 02:44   Medications:  anticoagulant sodium citrate     cefTRIAXone (ROCEPHIN)  IV      apixaban  5 mg Oral BID   Chlorhexidine Gluconate Cloth  6 each Topical Q0600   cinacalcet  30 mg Oral Q supper   darbepoetin (ARANESP) injection - DIALYSIS  25  mcg Intravenous Q Sat-HD   doxercalciferol  5 mcg Intravenous Q T,Th,Sa-HD   lanthanum  2,000 mg Oral TID WC   vancomycin variable dose per unstable renal function (pharmacist dosing)   Does not apply See admin instructions     Dialysis Orders: Center: [facility] T,Th,S 4 hrs 15 min 180NRe 425/Autoflow 1.5 175 kg 3.0 K/2.5 Ca TDC -No heparin. HIT POSITIVE -Hectorol 5 mcg IV TIW -Mircera 75 mcg IV q 2 weeks (last dose 03/04/2022)  -Venofer 50 mg IV q week (Last dose 03/18/2022)   Assessment/Plan:  Acute on Chronic HFrEF-Pulmonary congestion noted on CXR. No WOB. Appears comfortable on O2 @ 4L/M. UF as tolerated.  HFrEF-last EF 20-25%. Follows with Dr. [name]. Manage volume with  hemodialysis. Says he still makes urine. Start Torsemide on non HD days.   Possible Sepsis: BC pending. Has been started on broad spectrum ABX. He has TDC. Will await BC results. May need TDC exchange.   ESRD -  T.Th,S. Truncated treatments he says D/T work schedule. HD today off schedule 03/26/2022 and again today to resume T,Th,S.   Hypertension/volume  - As noted above. On Midodrine 10 mg PO TID.  Anemia  - HGB at goal on admission but no recent ESA. HGB 9.1 today.   Metabolic bone disease -  Continue sensipar, Lanthanum binders, VDRA. Renal Function panel added to today's labs.   Nutrition - Albumin OK. Renal diet with fluid restrictions.  Goal of Care: Patient says he is thinking about stopping dialysis. Says he is tired. Have discussed situation with primary team who are currently at bedside. Full code for now. Palliative Care Consult.   [name] H. Linette Gunderson NP-C 03/27/2022, 9:50 AM  [employer] [phone]

## 2022-03-27 NOTE — Progress Notes (Signed)
Subjective Paged around 7:30pm by RN for 8/10 pain. Went to bedside. Patient reported pain in his joints - predominately fingers, lower back and right knee. He states he does not have joint pain at home but only remembers this kind of pain when he was previously hospitalized for joint infections. He states he normally takes ibuprofen at home for pain. His pain has not responded to Tylenol and Voltaren gel.   Plan We discussed that NSAIDs should be avoided in setting of poor kidney function. Reassured that he is on antibiotics that should provide coverage in the event of joint infection. Given new onset joint pain that is preventing him from resting tonight, will provide one time dose of Dilaudid 1mg  PO.

## 2022-03-27 NOTE — Progress Notes (Signed)
Received patient in bed to unit.  Alert and oriented.  Informed consent signed and in chart.   Treatment initiated: 0936 Treatment completed: 1306  Patient tolerated well.  Transported back to the room  Alert, without acute distress.  Hand-off given to patient's nurse. Anson Crofts RN  Access used: CVC  Access issues: no  Total UF removed: 3000 Medication(s) given: hectorl, tylenol, aranesp, sodium citrate Post HD VS: 97.8,87,21,116/72, 100 % 4L  Post HD weight: 186.5 kg   At the start of treatment patient c/o joints (generalize) pain. Tylenol given. Patient able to tolerate treatment. Pt request to end treatment early; ama signed. CVC lock with sodium citrate (2.1 ml/lumen). Patient remains on 4L of oxygen.    [facility]

## 2022-03-27 NOTE — Progress Notes (Signed)
Pharmacy Antibiotic Note  Phillip Young is a 36 y.o. male admitted on 03/26/2022 with sepsis.  Pharmacy has been consulted for vancomycin dosing.  Hx of ESRD. WBC 11.2, LA 1.2, afebrile. Has hx of MRSA infection. Received 2g IV vancomycin on 9/1@0550 . Went for HD but only tolerated 2.3 hr of session. Received maintenance dose of 1g IV post-HD yesterday. Went back to HD today and tolerated full 4 hr session.   Plan: Will order vancomycin 750 mg IV tonight for maintenance dose since received HD earlier Order vancomyicn 1g IV with HD starting TTS Monitor cx results, clinical pic, and LOT  Will order scheduled with HD dosing - on TTS PTA   Weight:  (patient refused. NURSE NOTIFIED)  Temp (24hrs), Avg:98.9 F (37.2 C), Min:98.2 F (36.8 C), Max:99.4 F (37.4 C)  Recent Labs  Lab 03/26/22 0215 03/26/22 0341 03/27/22 0300  WBC 11.2*  --  7.6  CREATININE 11.10*  --  10.03*  LATICACIDVEN  --  1.2  --      Estimated Creatinine Clearance: 17.2 mL/min (A) (by C-G formula based on SCr of 10.03 mg/dL (H)).    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  Antonietta Jewel, PharmD, [facility] Pharmacist  Phone: [phone] 03/27/2022 4:12 PM  Please check AMION for all [place] phone numbers After 10:00 PM, call [place] [phone]

## 2022-03-28 ENCOUNTER — Other Ambulatory Visit: Payer: Self-pay

## 2022-03-28 DIAGNOSIS — I509 Heart failure, unspecified: Secondary | ICD-10-CM

## 2022-03-28 DIAGNOSIS — Z992 Dependence on renal dialysis: Secondary | ICD-10-CM | POA: Diagnosis not present

## 2022-03-28 DIAGNOSIS — N186 End stage renal disease: Secondary | ICD-10-CM | POA: Diagnosis not present

## 2022-03-28 DIAGNOSIS — M2550 Pain in unspecified joint: Secondary | ICD-10-CM

## 2022-03-28 DIAGNOSIS — I5023 Acute on chronic systolic (congestive) heart failure: Secondary | ICD-10-CM | POA: Diagnosis not present

## 2022-03-28 DIAGNOSIS — Z515 Encounter for palliative care: Secondary | ICD-10-CM

## 2022-03-28 DIAGNOSIS — J111 Influenza due to unidentified influenza virus with other respiratory manifestations: Secondary | ICD-10-CM

## 2022-03-28 LAB — CBC
HCT: 27.4 % — ABNORMAL LOW (ref 39.0–52.0)
Hemoglobin: 9.3 g/dL — ABNORMAL LOW (ref 13.0–17.0)
MCH: 34.2 pg — ABNORMAL HIGH (ref 26.0–34.0)
MCHC: 33.9 g/dL (ref 30.0–36.0)
MCV: 100.7 fL — ABNORMAL HIGH (ref 80.0–100.0)
Platelets: 192 10*3/uL (ref 150–400)
RBC: 2.72 MIL/uL — ABNORMAL LOW (ref 4.22–5.81)
RDW: 13.2 % (ref 11.5–15.5)
WBC: 4.7 10*3/uL (ref 4.0–10.5)
nRBC: 0 % (ref 0.0–0.2)

## 2022-03-28 LAB — BASIC METABOLIC PANEL
Anion gap: 12 (ref 5–15)
BUN: 31 mg/dL — ABNORMAL HIGH (ref 6–20)
CO2: 25 mmol/L (ref 22–32)
Calcium: 8.5 mg/dL — ABNORMAL LOW (ref 8.9–10.3)
Chloride: 97 mmol/L — ABNORMAL LOW (ref 98–111)
Creatinine, Ser: 7.87 mg/dL — ABNORMAL HIGH (ref 0.61–1.24)
GFR, Estimated: 8 mL/min — ABNORMAL LOW (ref >60)
Glucose, Bld: 107 mg/dL — ABNORMAL HIGH (ref 70–99)
Potassium: 3.5 mmol/L (ref 3.5–5.1)
Sodium: 134 mmol/L — ABNORMAL LOW (ref 135–145)

## 2022-03-28 MED ORDER — TORSEMIDE 100 MG PO TABS
100.0000 mg | ORAL_TABLET | Freq: Every day | ORAL | 0 refills | Status: DC
Start: 2022-03-28 — End: 2022-03-28

## 2022-03-28 MED ORDER — DICLOFENAC SODIUM 1 % EX GEL: 4.0000 g | Freq: Three times a day (TID) | CUTANEOUS | 0 refills | Status: DC | PRN

## 2022-03-28 MED ORDER — LANTHANUM CARBONATE 1000 MG PO CHEW: 2000.0000 mg | CHEWABLE_TABLET | Freq: Three times a day (TID) | ORAL | 1 refills | Status: DC

## 2022-03-28 MED ORDER — TORSEMIDE 100 MG PO TABS
ORAL_TABLET | ORAL | 0 refills | Status: DC
Start: 2022-03-28 — End: 2022-03-31

## 2022-03-28 MED ORDER — DARBEPOETIN ALFA 25 MCG/0.42ML IJ SOSY: 25.0000 ug | PREFILLED_SYRINGE | INTRAMUSCULAR | 0 refills | Status: DC

## 2022-03-28 MED ORDER — CINACALCET HCL 30 MG PO TABS: 30.0000 mg | ORAL_TABLET | Freq: Every day | ORAL | 1 refills | Status: DC

## 2022-03-28 MED ORDER — DOXERCALCIFEROL 4 MCG/2ML IV SOLN: 5.0000 ug | INTRAVENOUS | 0 refills | Status: DC

## 2022-03-28 MED ORDER — HYDROMORPHONE HCL 2 MG PO TABS: 1.0000 mg | ORAL_TABLET | Freq: Four times a day (QID) | ORAL | 0 refills | Status: AC | PRN

## 2022-03-28 NOTE — Discharge Instructions (Signed)
[facility] will call you for a hospital follow-up appointment.

## 2022-03-28 NOTE — Progress Notes (Signed)
Discharge instructions (including medications) discussed with and copy provided to patient.  All belongings sent with patient.

## 2022-03-28 NOTE — Discharge Summary (Addendum)
Name: Phillip Young MRN: [id number] DOB: [birth year] 36 y.o. PCP: [name], MD  Date of Admission: 03/26/2022  1:55 AM Date of Discharge: 03/28/2022 Attending Physician: Velna Ochs, MD  Discharge Diagnosis: 1. 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   benzonatate 200 MG capsule Commonly known as: TESSALON Take 1 capsule (200 mg total) by mouth 3 (three) times daily as needed for cough.   cinacalcet 30 MG tablet Commonly known as: SENSIPAR Take 1 tablet (30 mg total) by mouth daily with supper.   Darbepoetin Alfa 25 MCG/0.42ML Sosy injection Commonly known as: ARANESP Inject 0.42 mLs (25 mcg total) into the vein every Saturday with hemodialysis for 4 doses. Start taking on: April 03, 2022   diclofenac Sodium 1 % Gel Commonly known as: VOLTAREN Apply 4 g topically 3 (three) times daily as needed (joint pains).   doxercalciferol 4 MCG/2ML injection Commonly known as: HECTOROL Inject 2.5 mLs (5 mcg total) into the vein Every Tuesday,Thursday,and Saturday with dialysis for 12 doses. Start taking on: March 30, 2022   HYDROmorphone 2 MG tablet Commonly known as: Dilaudid Take 0.5 tablets (1 mg total) by mouth every 6 (six) hours as needed for up to 5 days for severe pain.   lanthanum 1000 MG chewable tablet Commonly known as: FOSRENOL Chew 2 tablets (2,000 mg total) by mouth 3 (three) times daily with meals.   torsemide 100 MG tablet Commonly known as: DEMADEX Take 1 tablet (100 mg  total) by mouth daily on Sunday, Monday, Wednesday, Friday.        Disposition and follow-up:   [name] was discharged from [facility] in Stable condition.  At the hospital follow up visit please address:  1.   Acute febrile flu-like illness -repeat CBC to check for WBC count -F/U final blood culture results  Polyarthralgias -Would check plain radiographs, ESR,CRP if persistent pain -D/C with short course of Dilauded, reassess pain and need for additional management  GOC -Continue to discuss the importance of HD in keeping patient alive and understanding his perspective of his disease process  2.  Labs / imaging needed at time of follow-up: CBC  3.  Pending labs/ test needing follow-up: Blood cultures  Follow-up Appointments:  [facility] will call the patient for hospital follow up appointment.  Hospital Course by problem list: Phillip Young is a 36 yo M with a PMH of HFrEF 2/2 NICM (EF 20 to 25%), ESRD on HD (TThS) via R tunneled cath c/b AOCD, OSA/OHS, atrial fibrillation s/p TEE/DCCV (10/22/2021) on chronic AC who presents with acute on chronic dyspnea in the setting of missed HD session, diffuse joint pain with malaise and chills found to have low grade fever and leukocytosis on admission.  Acute febrile flu-like illness Polyarthralgias During hospitalization 03/2021 patient noted to have MSSA bacteremia c/b Left SI septic arthritis, sacrum and iliac bone osteomyelitis, abscess left iliacus muscle, small abscess L piriformis. Felt his headache, fatigue, myalgias and arthralgias  on this admission could be related to this and found to have fever to 100.5 and wbc to 11.2 on admission.He was started on IV vancomycin and ceftriaxone given high risk with ESRD and c/f possible central line vs pulmonary infection.RPP was negative and CXR was without acute cardiopulmonary process. Peripheral bcx and central line bcx remained negative for 48 hours with no evidence  of local cellulitis around his central line. Symptoms seem like a viral flu like illness, although the patient has not had fevers and had resolving WBC count after antibiotics. WBC at 4.7 on discharge. Given clinical improvement after 2 days of IV antibiotics with stable vitals, no fevers after presentation,with no growth on bcx and high likelihood of viral infection, stopped antibiotics at discharge.    ESRD c/b AOCD Patient dyspneic in the setting of missed HD session on Wednesday. Reports chronic cough secondary to volume overload. CXR with mild airspace disease at the lung bases, possible edema or infiltrate.Was able to get a partial session on Friday after machine malfunction and patient not wanting to resume, total 1.3L fluid removal. Got full session with 3L fluid removal Saturday 9/2.Per nephrology:Started on torsemide 131m SuMWF at discharge,Continue sensipar 332mdialy, Lanthanum 200063mID, Darbepoetin Alfa 2m26mnjection every Saturday for HD, doxercalciferol 5mcg21mjection T-Th-Sa on HD days   HFrEF (EF 20-25%)NICM  He does not take any medications currently as he believes his heart failure medications precipitated his kidney failure, although it is unclear if this was secondary to hepatorenal syndrome. His EF has been stable since diagnosis in 2019. Echo 03/24/22 stable from prior. Volume control via HD while in the hospital. Discharge weight of 182.8 kg.   Atrial fibrillation s/p TEE/DCCV Continued home eliquis    OSA/OHS. Continued home BiPAP   GOC Regarding his ESRD and HD. Patient states that he knows that he has been told if he is off of HD he could die within days to weeks. However, if he does have a line infection and his tunneled HD cathter has to be removed, he would like to see how long he could go without getting dialysis before he becomes symptomatic. He feels that he continues to make urine and he may not need to go back on HD.   Discharge Exam:   BP 111/65 (BP  Location: Left Wrist)   Pulse 86   Temp 98.2 F (36.8 C) (Oral)   Resp 18   Wt (!) 182.8 kg   SpO2 97%   BMI 54.66 kg/m  Discharge exam:  Gen: obese, sitting at bedside, no acute distress, pleasant CV: RRR, normal S1/S2 although distant, no m/r/g, 2+ bilateral radial pulses Pulm: Generally LCTAB with stray expiratory wheezes, no crackles, good airflow, normal work of breathing Extremities: warm and dry, no peripheral edema  Pertinent Labs, Studies, and Procedures:     Latest Ref Rng & Units 03/28/2022    2:30 AM 03/27/2022    3:00 AM 03/26/2022    2:15 AM  CBC  WBC 4.0 - 10.5 K/uL 4.7  7.6  11.2   Hemoglobin 13.0 - 17.0 g/dL 9.3  9.1  10.1   Hematocrit 39.0 - 52.0 % 27.4  26.0  29.4   Platelets 150 - 400 K/uL 192  197  223        Latest Ref Rng & Units 03/28/2022    2:30 AM 03/27/2022    3:00 AM 03/26/2022    2:15 AM  BMP  Glucose 70 - 99 mg/dL 107  94  111   BUN 6 - 20 mg/dL 31  44  53   Creatinine 0.61 - 1.24 mg/dL 7.87  10.03  11.10   Sodium 135 - 145 mmol/L 134  137  136   Potassium 3.5 - 5.1 mmol/L 3.5  3.7  3.5   Chloride 98 - 111 mmol/L 97  99  100   CO2 22 - 32 mmol/L _0 Calcium 8.9 - 10.3 mg/dL 8.5  8.4  8.9    DG Chest 2 View  Result Date: 03/26/2022 CLINICAL DATA:  Shortness of breath. EXAM: CHEST - 2 VIEW COMPARISON:  02/18/2022. FINDINGS: Heart is enlarged the mediastinal contour is stable. Pulmonary vasculature is mildly distended. Mild airspace disease is present at the lung bases. No consolidation, effusion, or pneumothorax. A stable right internal jugular dialysis catheter is noted. No acute osseous abnormality IMPRESSION: 1. Cardiomegaly with pulmonary vascular congestion. 2. Mild airspace disease at the lung bases, possible edema or infiltrate. Electronically Signed   By: Brett Fairy M.D.   On: 03/26/2022 02:44     Discharge Instructions: Discharge Instructions     Diet - low sodium heart healthy   Complete by: As directed    Discharge instructions    Complete by: As directed    You were hospitalized for a fever, elevated white blood cell count (WBC), and symptoms that were concerning for bacterial infection of your lungs or your central line. You have not had a fever since and your WBC is now normal. We treated you with IV antibiotics. There was no evidence of infection of your lungs or your central line.Your blood cultures were negative. This was likely due to a viral flu-like illness which is the most likely reason you were having joint pain. We feel you are safe to stop antibiotics and return home.   Increase activity slowly   Complete by: As directed        Signed: Iona Coach, MD 03/28/2022, 1:07 PM   Pager: [phone]

## 2022-03-28 NOTE — Progress Notes (Signed)
[facility] Progress Note   Dialysis Orders: Center: [facility] T,Th,S 4 hrs 15 min 180NRe 425/Autoflow 1.5 175 kg 3.0 K/2.5 Ca TDC -No heparin. HIT POSITIVE -Hectorol 5 mcg IV TIW -Mircera 75 mcg IV q 2 weeks (last dose 03/04/2022)  -Venofer 50 mg IV q week (Last dose 03/18/2022)   Assessment/Plan:  Acute on Chronic HFrEF-Pulmonary congestion noted on CXR. No WOB. Appears comfortable on O2 @ 4L/M. UF as tolerated.  HFrEF-last EF 20-25%. Follows with Dr. [name]. Manage volume with hemodialysis. Says he still makes urine. Start Torsemide on non HD days.   Possible Sepsis: BC pending. Has been started on broad spectrum ABX. He has TDC. Will await BC results. May need TDC exchange.   ESRD -  T.Th,S. Truncated treatments he says D/T work schedule. HD today off schedule 03/26/2022 and again 9/2 w/ 3L UF. Plan for next HD treatment Tuesday. No absolute indication for RRT and the patient appears to be  comfortable.  Appreciate palliative speaking with the pt today; sometimes seems depressed and wants to just stop remove the cath and stop HD. Marland Kitchen   Hypertension/volume  - As noted above. On Midodrine 10 mg PO TID.  Anemia  - HGB at goal on admission but no recent ESA. HGB 9.1 .   Metabolic bone disease -  Continue sensipar, Lanthanum binders, VDRA. Check phos w/ next HD  Nutrition - Albumin OK. Renal diet with fluid restrictions.  Goal of Care: Patient says he is thinking about stopping dialysis. Says he is tired. Have discussed situation with primary team who are currently at bedside. Full code for now. Palliative Care Consult.    Subjective: Signed off HD 9/1 after 2.5 hours and stayed on for 3hr and 20 min on 9/2.  Denies f/c/n/v/ sob.  Objective Vitals:   03/27/22 2149 03/27/22 2151 03/27/22 2338 03/28/22 0428  BP:  119/73 111/70 117/74  Pulse:    85  Resp:  20 20   Temp: 99.4 F (37.4 C)  98 F (36.7 C) 98.4 F (36.9 C)  TempSrc: Oral  Oral Oral  SpO2:   98% 100%   Weight:    (!) 182.8 kg   General: Well developed, well nourished, in no acute distress. Lungs: Bilateral breath sounds essentially clear to ausculation. No WOB.   Heart: HS distant (body habitus) S1,S2 No M/R/G Abdomen: Obese, NABS Lower extremities:No LE edema Dialysis Access: R[facility]     Additional Objective Labs: Basic Metabolic Panel: Recent Labs  Lab 03/26/22 0215 03/27/22 0300 03/28/22 0230  NA 136 137 134*  K 3.5 3.7 3.5  CL 100 99 97*  CO2 22 24 25   GLUCOSE 111* 94 107*  BUN 53* 44* 31*  CREATININE 11.10* 10.03* 7.87*  CALCIUM 8.9 8.4* 8.5*   Liver Function Tests: Recent Labs  Lab 03/26/22 0215 03/27/22 0300  AST 12* 15  ALT 9 11  ALKPHOS 87 76  BILITOT 1.1 1.1  PROT 7.7 7.0  ALBUMIN 3.7 3.2*   No results for input(s): "LIPASE", "AMYLASE" in the last 168 hours. CBC: Recent Labs  Lab 03/26/22 0215 03/27/22 0300 03/28/22 0230  WBC 11.2* 7.6 4.7  NEUTROABS 9.0*  --   --   HGB 10.1* 9.1* 9.3*  HCT 29.4* 26.0* 27.4*  MCV 100.3* 98.9 100.7*  PLT 223 197 192   Blood Culture    Component Value Date/Time   SDES BLOOD LEFT HAND 03/26/2022 1958   SDES BLOOD RIGHT HAND 03/26/2022 1958   [place]  03/26/2022 1958    BOTTLES DRAWN AEROBIC ONLY Blood Culture adequate volume   SPECREQUEST  03/26/2022 1958    BOTTLES DRAWN AEROBIC ONLY Blood Culture adequate volume   CULT  03/26/2022 1958    NO GROWTH < 12 HOURS Performed at [facility], [place] [address], [place], [place] [address]    CULT  03/26/2022 1958    NO GROWTH < 12 HOURS Performed at [facility] [address] [place], [place] [address]    REPTSTATUS PENDING 03/26/2022 1958   REPTSTATUS PENDING 03/26/2022 1958    Cardiac Enzymes: No results for input(s): "CKTOTAL", "CKMB", "CKMBINDEX", "TROPONINI" in the last 168 hours. CBG: No results for input(s): "GLUCAP" in the last 168 hours. Iron Studies:  Recent Labs    03/26/22 1958  IRON 27*  TIBC 321  FERRITIN 370*    @lablastinr3 @ Studies/Results: No results found. Medications:  cefTRIAXone (ROCEPHIN)  IV 1 g (03/28/22 0900)   [START ON 03/30/2022] vancomycin      acetaminophen  1,000 mg Oral TID   apixaban  5 mg Oral BID   Chlorhexidine Gluconate Cloth  6 each Topical Q0600   cinacalcet  30 mg Oral Q supper   darbepoetin (ARANESP) injection - DIALYSIS  25 mcg Intravenous Q Sat-HD   diclofenac Sodium  4 g Topical QID   doxercalciferol  5 mcg Intravenous Q T,Th,Sa-HD   lanthanum  2,000 mg Oral TID WC   torsemide  100 mg Oral Once per day on Sun Mon Wed Fri
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
## 2022-03-29 ENCOUNTER — Telehealth: Payer: Self-pay | Admitting: Nurse Practitioner

## 2022-03-29 NOTE — Telephone Encounter (Signed)
Transition of care contact from inpatient facility  Date of Discharge: 03/28/2022 Date of Contact: 03/29/2022 Method of contact: Phone  Attempted to contact patient to discuss transition of care from inpatient admission. Patient did not answer the phone. Patient has VM that is full, unable to leave message.

## 2022-03-30 ENCOUNTER — Telehealth: Payer: Self-pay

## 2022-03-30 DIAGNOSIS — Z992 Dependence on renal dialysis: Secondary | ICD-10-CM | POA: Diagnosis not present

## 2022-03-30 DIAGNOSIS — T8249XA Other complication of vascular dialysis catheter, initial encounter: Secondary | ICD-10-CM | POA: Diagnosis not present

## 2022-03-30 DIAGNOSIS — N186 End stage renal disease: Secondary | ICD-10-CM | POA: Diagnosis not present

## 2022-03-30 DIAGNOSIS — N2581 Secondary hyperparathyroidism of renal origin: Secondary | ICD-10-CM | POA: Diagnosis not present

## 2022-03-30 NOTE — Progress Notes (Signed)
Late Entry Note:  Pt was d/c on 9/3. Contacted clinic today to advise clinic of pt's d/c and that pt should resume today.   Melven Sartorius Renal Navigator [phone]

## 2022-03-30 NOTE — Patient Outreach (Signed)
  Care Coordination TOC Note Transition Care Management Follow-up Telephone Call Date of discharge and from where: [facility] 03/26/22-03/28/22 How have you been since you were released from the hospital? "I am feeling better although I have joint pain and a stiff neck". Any questions or concerns? Yes- Joint pain continues, patient is afebrile and will discuss with PCP at next visit.  Items Reviewed: Did the pt receive and understand the discharge instructions provided? Yes  Medications obtained and verified? Yes  Other? No  Any new allergies since your discharge? Yes  Dietary orders reviewed? Yes Do you have support at home? Yes   Home Care and Equipment/Supplies: Were home health services ordered? no If so, what is the name of the agency? N/A  Has the agency set up a time to come to the patient's home? not applicable Were any new equipment or medical supplies ordered?  No What is the name of the medical supply agency? N/A Were you able to get the supplies/equipment? not applicable Do you have any questions related to the use of the equipment or supplies? No  Functional Questionnaire: (I = Independent and D = Dependent) ADLs: I  Bathing/Dressing- I  Meal Prep- I  Eating- I  Maintaining continence- I  Transferring/Ambulation- I  Managing Meds- I  Follow up appointments reviewed:  PCP Hospital f/u appt confirmed? No   Specialist Hospital f/u appt confirmed? Yes  Scheduled to see Dr. Curt Bears on 04/02/22 @ 0830. Are transportation arrangements needed? No  If their condition worsens, is the pt aware to call PCP or go to the Emergency Dept.? Yes Was the patient provided with contact information for the PCP's office or ED? Yes Was to pt encouraged to call back with questions or concerns? Yes  SDOH assessments and interventions completed:   Yes  Care Coordination Interventions Activated:  Yes   Care Coordination Interventions:  PCP follow up appointment requested    Encounter  Outcome:  Pt. Visit Completed

## 2022-03-31 LAB — CULTURE, BLOOD (ROUTINE X 2)
Culture: NO GROWTH
Culture: NO GROWTH
Culture: NO GROWTH
Culture: NO GROWTH
Special Requests: ADEQUATE
Special Requests: ADEQUATE
Special Requests: ADEQUATE
Special Requests: ADEQUATE

## 2022-04-01 DIAGNOSIS — T8249XA Other complication of vascular dialysis catheter, initial encounter: Secondary | ICD-10-CM | POA: Diagnosis not present

## 2022-04-01 DIAGNOSIS — N2581 Secondary hyperparathyroidism of renal origin: Secondary | ICD-10-CM | POA: Diagnosis not present

## 2022-04-01 DIAGNOSIS — M130 Polyarthritis, unspecified: Secondary | ICD-10-CM | POA: Diagnosis not present

## 2022-04-01 DIAGNOSIS — I509 Heart failure, unspecified: Secondary | ICD-10-CM | POA: Diagnosis not present

## 2022-04-01 DIAGNOSIS — Z992 Dependence on renal dialysis: Secondary | ICD-10-CM | POA: Diagnosis not present

## 2022-04-01 DIAGNOSIS — N186 End stage renal disease: Secondary | ICD-10-CM | POA: Diagnosis not present

## 2022-04-02 ENCOUNTER — Telehealth (HOSPITAL_COMMUNITY): Payer: Self-pay | Admitting: *Deleted

## 2022-04-02 ENCOUNTER — Ambulatory Visit: Payer: BC Managed Care – PPO | Admitting: Cardiology

## 2022-04-02 NOTE — Progress Notes (Deleted)
Electrophysiology Office Note   Date:  04/02/2022   ID:  Phillip Young, DOB 1986/03/20, MRN [id number]  PCP:  [name], MD  Cardiologist:  [name] Primary Electrophysiologist:  Tramaine Sauls Meredith Leeds, MD    Chief Complaint: CHF   History of Present Illness: Phillip Young is a 36 y.o. male who is being seen today for the evaluation of CHF at the request of Larey Dresser, MD. Presenting today for electrophysiology evaluation.  History sent for cardiomyopathy and obesity.  He has biventricular heart failure since 2019.  He is admitted to hospital August 2021 with acute on chronic heart failure exacerbation.  He was placed on milrinone for low output heart failure.  Left heart catheterization showed no coronary artery disease.  He was again admitted to the hospital July 2022 with cardiogenic shock and AKI.  He had renal failure and was started on dialysis at the time.  He had a rapid atrial fibrillation and was cardioverted several times.  Since then, he has had multiple admissions for respiratory failure and heart failure.  Unfortunately his LFTs became elevated.  Today, he denies*** symptoms of palpitations, chest pain, shortness of breath, orthopnea, PND, lower extremity edema, claudication, dizziness, presyncope, syncope, bleeding, or neurologic sequela. The patient is tolerating medications without difficulties.    Past Medical History:  Diagnosis Date   Biventricular congestive heart failure ([place])    Last Echo 11/2019 at [facility] reveals EF 20%   Class 3 severe obesity due to excess calories with serious comorbidity and body mass index (BMI) of 50.0 to 59.9 in adult [facility]) 02/26/2020   Essential hypertension 02/26/2020   GERD without esophagitis 02/26/2020   Hidradenitis suppurativa 02/26/2020   OSA (obstructive sleep apnea) 02/26/2020   Prediabetes 02/26/2020   Renal disorder    Past Surgical History:  Procedure Laterality Date   ABSCESS DRAINAGE     AV FISTULA  PLACEMENT Left 08/21/2021   Procedure: LEFT ARM ARTERIOVENOUS (AV) FISTULA.;  Surgeon: Serafina Mitchell, MD;  Location: [place];  Service: Vascular;  Laterality: Left;   CARDIOVERSION N/A 10/09/2021   Procedure: CARDIOVERSION;  Surgeon: Larey Dresser, MD;  Location: [place];  Service: Cardiovascular;  Laterality: N/A;   IR FLUORO GUIDE CV LINE RIGHT  03/10/2021   IR FLUORO GUIDE CV LINE RIGHT  04/22/2021   IR FLUORO GUIDE CV LINE RIGHT  08/20/2021   IR US GUIDE VASC ACCESS RIGHT  03/10/2021   IR US GUIDE VASC ACCESS RIGHT  04/22/2021   RIGHT HEART CATH N/A 03/06/2021   Procedure: RIGHT HEART CATH;  Surgeon: Jolaine Artist, MD;  Location: [place] CV [facility];  Service: Cardiovascular;  Laterality: N/A;   RIGHT/LEFT HEART CATH AND CORONARY ANGIOGRAPHY N/A 03/04/2020   Procedure: RIGHT/LEFT HEART CATH AND CORONARY ANGIOGRAPHY;  Surgeon: Larey Dresser, MD;  Location: [place] [facility];  Service: Cardiovascular;  Laterality: N/A;   TEE WITHOUT CARDIOVERSION N/A 05/05/2021   Procedure: TRANSESOPHAGEAL ECHOCARDIOGRAM (TEE);  Surgeon: Larey Dresser, MD;  Location: [facility] ENDOSCOPY;  Service: Cardiovascular;  Laterality: N/A;   TEMPORARY DIALYSIS CATHETER  03/06/2021   Procedure: TEMPORARY DIALYSIS CATHETER;  Surgeon: Jolaine Artist, MD;  Location: [place] CV [facility];  Service: Cardiovascular;;     Current Outpatient Medications  Medication Sig Dispense Refill   apixaban (ELIQUIS) 5 MG TABS tablet Take 1 tablet (5 mg total) by mouth 2 (two) times daily. 180 tablet 3   cinacalcet (SENSIPAR) 30 MG tablet TAKE 1 TABLET(30 MG) BY  MOUTH DAILY WITH SUPPER 90 tablet 2   diclofenac Sodium (VOLTAREN) 1 % GEL Apply 4 g topically 3 (three) times daily as needed (joint pains). 150 g 0   HYDROmorphone (DILAUDID) 2 MG tablet Take 0.5 tablets (1 mg total) by mouth every 6 (six) hours as needed for up to 5 days for severe pain. 10 tablet 0   torsemide (DEMADEX) 100 MG tablet TAKE 1 TABLET(100 MG) BY MOUTH  Sunday, Monday, Wednesday, Friday 90 tablet 2   No current facility-administered medications for this visit.    Allergies:   Coreg [carvedilol] and Heparin   Social History:  The patient  reports that he quit smoking about 4 years ago. His smoking use included cigarettes. He smoked an average of 1 pack per day. He has been exposed to tobacco smoke. He has quit using smokeless tobacco. He reports that he does not drink alcohol and does not use drugs.   Family History:  The patient's family history includes Drug abuse in his father; Heart disease in his mother; Hypertension in his mother; Pulmonary Hypertension in his mother.    ROS:  Please see the history of present illness.   Otherwise, review of systems is positive for none.   All other systems are reviewed and negative.    PHYSICAL EXAM: VS:  There were no vitals taken for this visit. , BMI There is no height or weight on file to calculate BMI. GEN: Well nourished, well developed, in no acute distress  HEENT: normal  Neck: no JVD, carotid bruits, or masses Cardiac: ***RRR; no murmurs, rubs, or gallops,no edema  Respiratory:  clear to auscultation bilaterally, normal work of breathing GI: soft, nontender, nondistended, + BS MS: no deformity or atrophy  Skin: warm and dry Neuro:  Strength and sensation are intact Psych: euthymic mood, full affect  EKG:  EKG {ACTION; IS/IS ZLD:[id number]} ordered today. Personal review of the ekg ordered *** shows ***  Recent Labs: 08/21/2021: TSH 3.217 02/18/2022: Magnesium 1.7 03/26/2022: B Natriuretic Peptide 374.2 03/27/2022: ALT 11 03/28/2022: BUN 31; Creatinine, Ser 7.87; Hemoglobin 9.3; Platelets 192; Potassium 3.5; Sodium 134    Lipid Panel     Component Value Date/Time   TRIG 137 07/29/2020 0140     Wt Readings from Last 3 Encounters:  03/28/22 (!) 403 lb (182.8 kg)  12/16/21 (!) 406 lb 6.4 oz (184.3 kg)  11/16/21 (!) 409 lb 9.6 oz (185.8 kg)      Other studies  Reviewed: Additional studies/ records that were reviewed today include: ***  Review of the above records today demonstrates: ***   ASSESSMENT AND PLAN:  1.  Chronic systolic heart failure: Nonischemic since 2019.  Ejection fraction less than 20%.  Possibly due to myocarditis.  Too large for cardiac MRI.  Also has been turned down for ICD due to his size.  He has been unable to tolerate goal-directed medical therapy due to dialysis.***  2.  Persistent atrial fibrillation: Has had multiple cardioversions.  Now off amiodarone due to elevated LFTs.  Currently on Eliquis 5 mg twice daily.  CHA2DS2-VASc of at least 2.  3.  End-stage renal disease: Currently on dialysis  4.  Obstructive sleep apnea: CPAP compliance encouraged  5.  Super morbid obesity: Lifestyle modification encouraged There is no height or weight on file to calculate BMI.   Current medicines are reviewed at length with the patient today.   The patient {ACTIONS; HAS/DOES NOT HAVE:19233} concerns regarding his medicines.  The following changes were made  today:  {NONE DEFAULTED:18576}  Labs/ tests ordered today include: *** No orders of the defined types were placed in this encounter.    Disposition:   FU with Mckade Gurka {gen number 1-89:842103} {Days to years:10300}  Signed, Dasiah Hooley Meredith Leeds, MD  04/02/2022 7:52 AM     [facility] [address] [place] [address] [phone] (office) [phone] (fax)

## 2022-04-02 NOTE — Telephone Encounter (Signed)
Pt called to check on DOT paperwork.   Routed to [employer]

## 2022-04-02 NOTE — Telephone Encounter (Signed)
Tried calling patient to let him know his paperwork is ready for collection. Will leave it at reception for him to collect

## 2022-04-03 DIAGNOSIS — Z992 Dependence on renal dialysis: Secondary | ICD-10-CM | POA: Diagnosis not present

## 2022-04-03 DIAGNOSIS — T8249XA Other complication of vascular dialysis catheter, initial encounter: Secondary | ICD-10-CM | POA: Diagnosis not present

## 2022-04-03 DIAGNOSIS — N186 End stage renal disease: Secondary | ICD-10-CM | POA: Diagnosis not present

## 2022-04-03 DIAGNOSIS — N2581 Secondary hyperparathyroidism of renal origin: Secondary | ICD-10-CM | POA: Diagnosis not present

## 2022-04-06 DIAGNOSIS — Z992 Dependence on renal dialysis: Secondary | ICD-10-CM | POA: Diagnosis not present

## 2022-04-06 DIAGNOSIS — T8249XA Other complication of vascular dialysis catheter, initial encounter: Secondary | ICD-10-CM | POA: Diagnosis not present

## 2022-04-06 DIAGNOSIS — N2581 Secondary hyperparathyroidism of renal origin: Secondary | ICD-10-CM | POA: Diagnosis not present

## 2022-04-06 DIAGNOSIS — N186 End stage renal disease: Secondary | ICD-10-CM | POA: Diagnosis not present

## 2022-04-07 DIAGNOSIS — I509 Heart failure, unspecified: Secondary | ICD-10-CM | POA: Diagnosis not present

## 2022-04-07 DIAGNOSIS — J9610 Chronic respiratory failure, unspecified whether with hypoxia or hypercapnia: Secondary | ICD-10-CM | POA: Diagnosis not present

## 2022-04-07 DIAGNOSIS — E662 Morbid (severe) obesity with alveolar hypoventilation: Secondary | ICD-10-CM | POA: Diagnosis not present

## 2022-04-07 DIAGNOSIS — I5023 Acute on chronic systolic (congestive) heart failure: Secondary | ICD-10-CM | POA: Diagnosis not present

## 2022-04-08 DIAGNOSIS — N2581 Secondary hyperparathyroidism of renal origin: Secondary | ICD-10-CM | POA: Diagnosis not present

## 2022-04-08 DIAGNOSIS — T8249XA Other complication of vascular dialysis catheter, initial encounter: Secondary | ICD-10-CM | POA: Diagnosis not present

## 2022-04-08 DIAGNOSIS — Z992 Dependence on renal dialysis: Secondary | ICD-10-CM | POA: Diagnosis not present

## 2022-04-08 DIAGNOSIS — N186 End stage renal disease: Secondary | ICD-10-CM | POA: Diagnosis not present

## 2022-04-09 ENCOUNTER — Ambulatory Visit (INDEPENDENT_AMBULATORY_CARE_PROVIDER_SITE_OTHER): Payer: BC Managed Care – PPO

## 2022-04-09 DIAGNOSIS — I5022 Chronic systolic (congestive) heart failure: Secondary | ICD-10-CM

## 2022-04-09 DIAGNOSIS — Z09 Encounter for follow-up examination after completed treatment for conditions other than malignant neoplasm: Secondary | ICD-10-CM | POA: Insufficient documentation

## 2022-04-09 DIAGNOSIS — Z87891 Personal history of nicotine dependence: Secondary | ICD-10-CM

## 2022-04-09 NOTE — Assessment & Plan Note (Addendum)
Patient discharged 2 weeks ago after admission for flu like illness and polyarthralgia. Workup was negative. His joints were without any signs of effusion or tenosynovitis. Symptoms have resolved since discharge so will hold off on further rheumatologic workup. Blood cultures negative. He had a normal WBC at discharge and is afebrile today without any symptoms. Will forego lab testing at this time.   -Could consider rheumatologic workup if polyarthralgia returns in future

## 2022-04-09 NOTE — Progress Notes (Signed)
   CC: HFU  HPI:  [name] is a 36 y.o. with past medical history as below who presents for hospital follow up.  Past Medical History:  Diagnosis Date   Acute on chronic respiratory failure with hypoxia ([place]) 04/21/2021   Acute on chronic systolic (congestive) heart failure ([name]) 02/26/2020   Biventricular congestive heart failure ([place])    Last Echo 11/2019 at [facility] reveals EF 20%   Class 3 severe obesity due to excess calories with serious comorbidity and body mass index (BMI) of 50.0 to 59.9 in adult [facility]) 02/26/2020   Essential hypertension 02/26/2020   GERD without esophagitis 02/26/2020   Hidradenitis suppurativa 02/26/2020   OSA (obstructive sleep apnea) 02/26/2020   Prediabetes 02/26/2020   Renal disorder    Review of Systems:  See detailed assessment and plan for pertinent ROS.  Physical Exam:  Vitals:   04/09/22 0916  BP: 109/62  Pulse: 89  Temp: 98.1 F (36.7 C)  TempSrc: Oral  SpO2: 97%  Weight: (!) 400 lb 11.2 oz (181.8 kg)  Height: 6' (1.829 m)   Physical Exam Constitutional:      General: He is not in acute distress.    Appearance: He is obese.  Eyes:     Extraocular Movements: Extraocular movements intact.  Cardiovascular:     Rate and Rhythm: Normal rate and regular rhythm.     Heart sounds: No murmur heard. Pulmonary:     Effort: Pulmonary effort is normal. No respiratory distress.     Breath sounds: No wheezing, rhonchi or rales.  Musculoskeletal:     Left lower leg: No edema.  Skin:    General: Skin is warm and dry.  Neurological:     Mental Status: He is alert and oriented to person, place, and time.  Psychiatric:        Mood and Affect: Mood normal.      Assessment & Plan:   See Encounters Tab for problem based charting.  Hospital discharge follow-up Patient discharged 2 weeks ago after admission for flu like illness and polyarthralgia. Workup was negative. His joints were without any signs of effusion or tenosynovitis.  Symptoms have resolved since discharge so will hold off on further rheumatologic workup. Blood cultures negative. He had a normal WBC at discharge and is afebrile today without any symptoms. Will forgo lab testing at this time.    Chronic systolic CHF (congestive heart failure) ([name]) Recent echo with EF 20-25% which was unchanged from prior. Patient denies symptoms of SOB or swelling today. Fine crackles at lower lung fields bilaterally. Sating well on room air. GDMT limited due to ESRD and propensity for hypotension.  -F/u with cardiology 9/25 -Continue torsemide -Fluid management with dialysis      Patient discussed with Dr. Dareen Piano

## 2022-04-09 NOTE — Patient Instructions (Addendum)
[name], it was a pleasure seeing you today!  Today we discussed: Recent hospital admission: I am encouraged that you are no longer having flu symptoms and joint pains. We will see you back in 1 month to discuss medical management of your other health problems.  I have ordered the following labs today:  Lab Orders  No laboratory test(s) ordered today     Tests ordered today:  none  Referrals ordered today:   Referral Orders  No referral(s) requested today     I have ordered the following medication/changed the following medications:   Stop the following medications: There are no discontinued medications.   Start the following medications: No orders of the defined types were placed in this encounter.    Follow-up:  1 month    Please make sure to arrive 15 minutes prior to your next appointment. If you arrive late, you may be asked to reschedule.   We look forward to seeing you next time. Please call our clinic at [phone] if you have any questions or concerns. The best time to call is Monday-Friday from 9am-4pm, but there is someone available 24/7. If after hours or the weekend, call the main hospital number and ask for the Internal Medicine Resident On-Call. If you need medication refills, please notify your pharmacy one week in advance and they will send [place] a request.  Thank you for letting us take part in your care. Wishing you the best!  Thank you, Linward Natal, MD

## 2022-04-09 NOTE — Assessment & Plan Note (Signed)
Recent echo with EF 20-25% which was unchanged from prior. Patient denies symptoms of SOB or swelling today. Fine crackles at lower lung fields bilaterally. Sating well on room air. GDMT limited due to ESRD and propensity for hypotension.  -F/u with cardiology 9/25 -Continue torsemide -Fluid management with dialysis

## 2022-04-10 DIAGNOSIS — N2581 Secondary hyperparathyroidism of renal origin: Secondary | ICD-10-CM | POA: Diagnosis not present

## 2022-04-10 DIAGNOSIS — Z992 Dependence on renal dialysis: Secondary | ICD-10-CM | POA: Diagnosis not present

## 2022-04-10 DIAGNOSIS — T8249XA Other complication of vascular dialysis catheter, initial encounter: Secondary | ICD-10-CM | POA: Diagnosis not present

## 2022-04-10 DIAGNOSIS — N186 End stage renal disease: Secondary | ICD-10-CM | POA: Diagnosis not present

## 2022-04-12 NOTE — Progress Notes (Signed)
Internal Medicine Clinic Attending   I saw and evaluated the patient.  I personally confirmed the key portions of the history and exam documented by Dr. White and I reviewed pertinent patient test results.  The assessment, diagnosis, and plan were formulated together and I agree with the documentation in the resident's note.  

## 2022-04-15 DIAGNOSIS — J9610 Chronic respiratory failure, unspecified whether with hypoxia or hypercapnia: Secondary | ICD-10-CM | POA: Diagnosis not present

## 2022-04-15 DIAGNOSIS — I509 Heart failure, unspecified: Secondary | ICD-10-CM | POA: Diagnosis not present

## 2022-04-15 DIAGNOSIS — I5023 Acute on chronic systolic (congestive) heart failure: Secondary | ICD-10-CM | POA: Diagnosis not present

## 2022-04-16 ENCOUNTER — Telehealth (HOSPITAL_COMMUNITY): Payer: Self-pay

## 2022-04-16 NOTE — Telephone Encounter (Signed)
Called to confirm/remind patient of their appointment at the [facility] on 04/19/22.   Patient reminded to bring all medications and/or complete list.  Confirmed patient has transportation. Gave directions, instructed to utilize [place] parking.  Confirmed appointment prior to ending call.

## 2022-04-18 DIAGNOSIS — I5082 Biventricular heart failure: Secondary | ICD-10-CM | POA: Diagnosis not present

## 2022-04-18 DIAGNOSIS — I5023 Acute on chronic systolic (congestive) heart failure: Secondary | ICD-10-CM | POA: Diagnosis not present

## 2022-04-19 ENCOUNTER — Encounter (HOSPITAL_COMMUNITY): Payer: Self-pay

## 2022-04-19 ENCOUNTER — Ambulatory Visit (HOSPITAL_COMMUNITY)
Admission: RE | Admit: 2022-04-19 | Discharge: 2022-04-19 | Disposition: A | Discharge status: Home / Self Care | Payer: BC Managed Care – PPO | Source: Ambulatory Visit | Attending: Family Medicine | Admitting: Family Medicine

## 2022-04-19 VITALS — BP 106/72 | HR 89 | Wt >= 6400 oz

## 2022-04-19 DIAGNOSIS — I480 Paroxysmal atrial fibrillation: Secondary | ICD-10-CM | POA: Diagnosis not present

## 2022-04-19 DIAGNOSIS — Z992 Dependence on renal dialysis: Secondary | ICD-10-CM | POA: Diagnosis not present

## 2022-04-19 DIAGNOSIS — I5082 Biventricular heart failure: Secondary | ICD-10-CM | POA: Insufficient documentation

## 2022-04-19 DIAGNOSIS — N186 End stage renal disease: Secondary | ICD-10-CM | POA: Diagnosis not present

## 2022-04-19 DIAGNOSIS — Z9981 Dependence on supplemental oxygen: Secondary | ICD-10-CM | POA: Diagnosis not present

## 2022-04-19 DIAGNOSIS — I132 Hypertensive heart and chronic kidney disease with heart failure and with stage 5 chronic kidney disease, or end stage renal disease: Secondary | ICD-10-CM | POA: Insufficient documentation

## 2022-04-19 DIAGNOSIS — I5022 Chronic systolic (congestive) heart failure: Secondary | ICD-10-CM | POA: Diagnosis not present

## 2022-04-19 DIAGNOSIS — G4733 Obstructive sleep apnea (adult) (pediatric): Secondary | ICD-10-CM | POA: Insufficient documentation

## 2022-04-19 DIAGNOSIS — Z7901 Long term (current) use of anticoagulants: Secondary | ICD-10-CM | POA: Diagnosis not present

## 2022-04-19 DIAGNOSIS — Z6841 Body Mass Index (BMI) 40.0 and over, adult: Secondary | ICD-10-CM | POA: Insufficient documentation

## 2022-04-19 DIAGNOSIS — I428 Other cardiomyopathies: Secondary | ICD-10-CM | POA: Diagnosis not present

## 2022-04-19 DIAGNOSIS — Z8679 Personal history of other diseases of the circulatory system: Secondary | ICD-10-CM | POA: Diagnosis not present

## 2022-04-19 NOTE — Progress Notes (Signed)
PCP: Phillip Pope, MD Cardiology: Dr. [name]  HPI: 36 y.o. with history of cardiomyopathy and obesity.  He has been known to have a cardiomyopathy (biventricular failure) since back in 2019, echo in 2019 with EF 20% [facility]).  He has been followed in the [facility] system.  Admitted to [facility] in 2/67 with A/C Systolic HF. Diuresed with IV lasix and transitioned to torsemide. Placed on milrinone with low output and had RHC/LHC. Cath showed no CAD and volume overload.  Blood gas revealed a CO2 level elevated to 78. CPAP was initiated.   He was unable to tolerate Coreg due to wheezing and diarrhea.   Echo in 11/21 showed EF 20-25%, RV poorly visualized.  He was seen by EP, thought to be too large for cardiac MRI.   Patient was admitted in 7/22 with cardiogenic shock and AKI.  He ended up on CVVH.  He had a prolonged hospitalization for volume removal and titration off inotropes/pressors.  Eventually he was able to transition to [facility] and was discharged. Hospitalization was complicated by AF with RVR as well as HIT.  He was cardioverted several times.  Echo in 7/22 showed EF < 20%, severe LV enlargement, normal RV.   Admitted 9/22 with MSSA bacteremia due to infected HD catheter. TEE showed EF 20%, severely decreased LV with global HK, moderately reduced RV, no vegetation or thrombus noted. Hospitalization complicated by septic arthritis and muscle abscess. IV abx per ID.     Admitted 1/23 at [facility] with a/c CHF.  Re-admitted shortly thereafter after presenting to HD tachycardic and tachypneic. HD staff unable to access his dialysis catheter. He underwent a tunnel catheter exchange and taken for AV fistula creation.    Admitted 2/23 for acute on chronic hypoxic respiratory failure and a/c CHF in setting of incomplete HD sessions and dietary indiscretion/noncompliance with fluid restriction. He received serial HD sessions and was seen by PMT for [place], he remained full code.    Patient had recurrent AF with DCCV in 3/23. Amiodarone has been stopped due to elevated LFTs.   Echo (8/23) showed EF 20-25%, global LV HK, normal RV, RVSP 70.6 mmHg, mild to moderate MR  Admitted 9/23 with a/c CHF in setting of missing dialysis session and flu-like illness. Underwent dialysis session. No source of infection identified and abx stopped at discharge. Palliative care consulted as patient is thinking about stopping dialysis. He remained full code.  Today he returns for post hospital HF follow up. Overall feeling fine. He is able to walk around [facility] without dyspnea. 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 Denies palpitations, abnormal bleeding, CP, dizziness, edema, or PND/Orthopnea. Appetite ok. No fever or chills. Weight stable. Wears CPAP and has stopped his torsemide.  Labs (10/21): K 3.7, creatinine 1.07 Labs (2/23): AST 25, ALT 113 Labs (9/23): hgb 9.3, LFTs normal  ECG (personally reviewed): NSR 88 bpm  PMH: 1. Hidradenitis suppuritiva 2. GERD 3. OHS/OSA  4. Diabetes 5. Morbid obesity 6. Fe deficiency anemia 7. Chronic systolic CHF: Nonischemic cardiomyopathy.  No history of ETOH or drugs.  +ANA.  No strong FH of cardiomyopathy.  - Echo (10/19, [facility]): EF 20%.  - Echo (5/21, [facility]): EF 20% - Echo (8/21): EF < 20%, moderate LV dilation, severely decreased RV function with severe RV dilation, severe biatrial enlargement, mild MR.  - LHC/RHC (8/21): No significant coronary disease; mean RA 19, PA 71/20 mean 39, mean PCWP 23, CI  2.1, PVR 2.5 WU.  - Echo (11/21): EF 20-25%, RV poorly visualized.  - Echo (7/22): EF < 20%, severely dilated LV.  - TEE (10/22): EF 20%, moderate LV dilation, mild LVH, moderately reduced RV systolic function - Echo (8/23): EF 20-25%, global LV HK, normal RV, RVSP 70.6 mmHg, mild to moderate MR 8. Prior smoker.  9. ESRD 10. Atrial fibrillation: Paroxysmal.  11. History of HIT.   ROS: All systems  negative except as listed in HPI, PMH and Problem List.  Social History   Socioeconomic History   Marital status: Single    Spouse name: Not on file   Number of children: Not on file   Years of education: Not on file   Highest education level: Not on file  Occupational History   Not on file  Tobacco Use   Smoking status: Former    Packs/day: 1.00    Types: Cigarettes    Quit date: 2019    Years since quitting: 4.7    Passive exposure: Current   Smokeless tobacco: Former   Tobacco comments:    quit in 2019  Vaping Use   Vaping Use: Never used  Substance and Sexual Activity   Alcohol use: Never   Drug use: Never   Sexual activity: Not on file  Other Topics Concern   Not on file  Social History Narrative   Not on file   Social Determinants of Health   Financial Resource Strain: Low Risk  (04/13/2021)   Overall Financial Resource Strain (CARDIA)    Difficulty of Paying Living Expenses: Not very hard  Food Insecurity: Food Insecurity Present (04/13/2021)   Hunger Vital Sign    Worried About Running Out of Food in the Last Year: Never true    Ran Out of Food in the Last Year: Sometimes true  Transportation Needs: No Transportation Needs (03/30/2022)   PRAPARE - Hydrologist (Medical): No    Lack of Transportation (Non-Medical): No  Physical Activity: Not on file  Stress: Not on file  Social Connections: Not on file  Intimate Partner Violence: Not on file   Family History  Problem Relation Age of Onset   Heart disease Mother    Hypertension Mother    Pulmonary Hypertension Mother    Drug abuse Father        died due to Heroin overdose    Past Medical History:  Diagnosis Date   Acute on chronic respiratory failure with hypoxia ([place]) 04/21/2021   Acute on chronic systolic (congestive) heart failure ([place]) 02/26/2020   Biventricular congestive heart failure ([place])    Last Echo 11/2019 at [facility] reveals EF 20%   Class 3 severe obesity due  to excess calories with serious comorbidity and body mass index (BMI) of 50.0 to 59.9 in adult [facility]) 02/26/2020   Essential hypertension 02/26/2020   GERD without esophagitis 02/26/2020   Hidradenitis suppurativa 02/26/2020   OSA (obstructive sleep apnea) 02/26/2020   Prediabetes 02/26/2020   Renal disorder     Current Outpatient Medications  Medication Sig Dispense Refill   apixaban (ELIQUIS) 5 MG TABS tablet Take 1 tablet (5 mg total) by mouth 2 (two) times daily. 180 tablet 3   cinacalcet (SENSIPAR) 30 MG tablet TAKE 1 TABLET(30 MG) BY MOUTH DAILY WITH SUPPER 90 tablet 2   diclofenac Sodium (VOLTAREN) 1 % GEL Apply 4 g topically 3 (three) times daily as needed (joint pains). 150 g 0   No current facility-administered medications  for this encounter.   BP 106/72   Pulse 89   Wt (!) 185.1 kg (408 lb)   SpO2 97%   BMI 55.33 kg/m   Wt Readings from Last 3 Encounters:  04/19/22 (!) 185.1 kg (408 lb)  04/09/22 (!) 181.8 kg (400 lb 11.2 oz)  03/28/22 (!) 182.8 kg (403 lb)   PHYSICAL EXAM: General:  NAD. No resp difficulty, walked into clinic HEENT: Normal Neck: Supple. No JVD. Carotids 2+ bilat; no bruits. No lymphadenopathy or thryomegaly appreciated. Cor: PMI nondisplaced. Regular rate & rhythm. No rubs, gallops or murmurs. Lungs: Diminished in bases; R chest TDC looks ok. Abdomen: Obese, soft, nontender, nondistended. No hepatosplenomegaly. No bruits or masses. Good bowel sounds. Extremities: No cyanosis, clubbing, rash, edema Neuro: Alert & oriented x 3, cranial nerves grossly intact. Moves all 4 extremities w/o difficulty. Affect pleasant.  ASSESSMENT & PLAN: 1. Chronic Biventricular Heart Failure:  Known NICM since 2019, previously followed by [facility].  Echo 8/21 EF < 20%, moderate LV dilation, severely decreased RV function, severe biatrial enlargement, mild MR. No history of ETOH/drugs.  No FH of cardiomyopathy (mother with [place]).  He has biventricular failure, nonischemic  dilated cardiomyopathy, ?related to prior myocarditis.  He is too large for cardiac MRI. RHC/LHC on 03/04/20 with no CAD low CI at 2.1. Evaluated by EP but not a candidate for ICD given BMI.  Echo in 7/22 with EF < 20%, RV poorly visualized.  Developed cardiogenic shock 7/22 admission requiring pressors/inotropes and had AKI progressing to ESRD.  He titrated off inotropes/pressors.  He has also been able to stop midodrine.  TEE in 10/22 showed EF 20%, moderate LV dilation, mild LVH, moderately reduced RV systolic function.  Echo 8/23 showed EF stable at 20-25%. He has been unable to tolerate GDMT due to low BP with HD.  Not volume overloaded on exam. NYHA II today. - Continue HD for volume control, he is tolerating well.  - He was started on torsemide 20 mg on non-dialysis days during recent admission but has since stopped. - He required midodrine during recent admission. BP now stable off midodrine. 2. Atrial fibrillation: Paroxysmal.  DCCV several times in 8/22 admission.  DCCV in 3/23.  Off amiodarone due to elevated LFTs.  He is in NSR today.   - Continue Eliquis 5 mg bid, hold 2 days prior to fistula placement per Dr. Claris Gladden prior recommendations.   3. ESRD: So far, tolerating HD and getting fluid off effectively. He has not arranged follow up with [facility] to discuss permanent access yet. I encouraged him to do so. 4. OHS/OSA: Using 2 L home oxygen and CPAP at night.    5. Super Morbid Obesity: Body mass index is 55.33 kg/m. - Insurance would not cover semaglutide.    Follow up in 4 months with Dr. [name].   Given a note for work today. 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 He has stable NYHA II symptoms.  Maricela Bo [facility] FNP-BC 04/19/2022

## 2022-04-19 NOTE — Patient Instructions (Addendum)
Thank you for coming in today  No labs today  EKG was done today  Your physician recommends that you schedule a follow-up appointment in:  4 months with Dr. [name] call November 2023 for January 2024 appointment     Do the following things EVERYDAY: Weigh yourself in the morning before breakfast. Write it down and keep it in a log. Take your medicines as prescribed Eat low salt foods--Limit salt (sodium) to 2000 mg per day.  Stay as active as you can everyday Limit all fluids for the day to less than 2 liters  At the [facility], you and your health needs are our priority. As part of our continuing mission to provide you with exceptional heart care, we have created designated Provider Care Teams. These Care Teams include your primary Cardiologist (physician) and Advanced Practice Providers (APPs- Physician Assistants and Nurse Practitioners) who all work together to provide you with the care you need, when you need it.   You may see any of the following providers on your designated Care Team at your next follow up: Dr Glori Bickers Dr [name] Dr. Roxana Hires, NP Lyda Jester, [place] [facility] [place], [place] Forestine Na, NP Audry Riles, PharmD   Please be sure to bring in all your medications bottles to every appointment.   If you have any questions or concerns before your next appointment please send [place] a message through [place] or call our office at [phone].    TO LEAVE A MESSAGE FOR THE NURSE SELECT OPTION 2, PLEASE LEAVE A MESSAGE INCLUDING: YOUR NAME DATE OF BIRTH CALL BACK NUMBER REASON FOR CALL**this is important as we prioritize the call backs  YOU WILL RECEIVE A CALL BACK THE SAME DAY AS LONG AS YOU CALL BEFORE 4:00 PM

## 2022-04-20 ENCOUNTER — Other Ambulatory Visit (HOSPITAL_COMMUNITY): Payer: Self-pay | Admitting: *Deleted

## 2022-04-20 DIAGNOSIS — Z992 Dependence on renal dialysis: Secondary | ICD-10-CM | POA: Diagnosis not present

## 2022-04-20 DIAGNOSIS — T8249XA Other complication of vascular dialysis catheter, initial encounter: Secondary | ICD-10-CM | POA: Diagnosis not present

## 2022-04-20 DIAGNOSIS — N2581 Secondary hyperparathyroidism of renal origin: Secondary | ICD-10-CM | POA: Diagnosis not present

## 2022-04-20 DIAGNOSIS — N186 End stage renal disease: Secondary | ICD-10-CM | POA: Diagnosis not present

## 2022-04-22 DIAGNOSIS — T8249XA Other complication of vascular dialysis catheter, initial encounter: Secondary | ICD-10-CM | POA: Diagnosis not present

## 2022-04-22 DIAGNOSIS — N186 End stage renal disease: Secondary | ICD-10-CM | POA: Diagnosis not present

## 2022-04-22 DIAGNOSIS — N2581 Secondary hyperparathyroidism of renal origin: Secondary | ICD-10-CM | POA: Diagnosis not present

## 2022-04-22 DIAGNOSIS — Z992 Dependence on renal dialysis: Secondary | ICD-10-CM | POA: Diagnosis not present

## 2022-04-24 DIAGNOSIS — N2581 Secondary hyperparathyroidism of renal origin: Secondary | ICD-10-CM | POA: Diagnosis not present

## 2022-04-24 DIAGNOSIS — Z992 Dependence on renal dialysis: Secondary | ICD-10-CM | POA: Diagnosis not present

## 2022-04-24 DIAGNOSIS — N186 End stage renal disease: Secondary | ICD-10-CM | POA: Diagnosis not present

## 2022-04-24 DIAGNOSIS — T8249XA Other complication of vascular dialysis catheter, initial encounter: Secondary | ICD-10-CM | POA: Diagnosis not present

## 2022-04-25 DIAGNOSIS — I5082 Biventricular heart failure: Secondary | ICD-10-CM | POA: Diagnosis not present

## 2022-04-25 DIAGNOSIS — I5023 Acute on chronic systolic (congestive) heart failure: Secondary | ICD-10-CM | POA: Diagnosis not present

## 2022-04-27 DIAGNOSIS — N186 End stage renal disease: Secondary | ICD-10-CM | POA: Diagnosis not present

## 2022-04-27 DIAGNOSIS — N2581 Secondary hyperparathyroidism of renal origin: Secondary | ICD-10-CM | POA: Diagnosis not present

## 2022-04-27 DIAGNOSIS — T8249XA Other complication of vascular dialysis catheter, initial encounter: Secondary | ICD-10-CM | POA: Diagnosis not present

## 2022-04-27 DIAGNOSIS — Z992 Dependence on renal dialysis: Secondary | ICD-10-CM | POA: Diagnosis not present

## 2022-04-29 DIAGNOSIS — Z992 Dependence on renal dialysis: Secondary | ICD-10-CM | POA: Diagnosis not present

## 2022-04-29 DIAGNOSIS — T8249XA Other complication of vascular dialysis catheter, initial encounter: Secondary | ICD-10-CM | POA: Diagnosis not present

## 2022-04-29 DIAGNOSIS — N2581 Secondary hyperparathyroidism of renal origin: Secondary | ICD-10-CM | POA: Diagnosis not present

## 2022-04-29 DIAGNOSIS — N186 End stage renal disease: Secondary | ICD-10-CM | POA: Diagnosis not present

## 2022-05-01 DIAGNOSIS — Z992 Dependence on renal dialysis: Secondary | ICD-10-CM | POA: Diagnosis not present

## 2022-05-01 DIAGNOSIS — N2581 Secondary hyperparathyroidism of renal origin: Secondary | ICD-10-CM | POA: Diagnosis not present

## 2022-05-01 DIAGNOSIS — T8249XA Other complication of vascular dialysis catheter, initial encounter: Secondary | ICD-10-CM | POA: Diagnosis not present

## 2022-05-01 DIAGNOSIS — N186 End stage renal disease: Secondary | ICD-10-CM | POA: Diagnosis not present

## 2022-05-04 DIAGNOSIS — T8249XA Other complication of vascular dialysis catheter, initial encounter: Secondary | ICD-10-CM | POA: Diagnosis not present

## 2022-05-04 DIAGNOSIS — Z992 Dependence on renal dialysis: Secondary | ICD-10-CM | POA: Diagnosis not present

## 2022-05-04 DIAGNOSIS — N2581 Secondary hyperparathyroidism of renal origin: Secondary | ICD-10-CM | POA: Diagnosis not present

## 2022-05-04 DIAGNOSIS — N186 End stage renal disease: Secondary | ICD-10-CM | POA: Diagnosis not present

## 2022-05-06 DIAGNOSIS — N2581 Secondary hyperparathyroidism of renal origin: Secondary | ICD-10-CM | POA: Diagnosis not present

## 2022-05-06 DIAGNOSIS — N186 End stage renal disease: Secondary | ICD-10-CM | POA: Diagnosis not present

## 2022-05-06 DIAGNOSIS — T8249XA Other complication of vascular dialysis catheter, initial encounter: Secondary | ICD-10-CM | POA: Diagnosis not present

## 2022-05-06 DIAGNOSIS — Z992 Dependence on renal dialysis: Secondary | ICD-10-CM | POA: Diagnosis not present

## 2022-05-07 DIAGNOSIS — I5023 Acute on chronic systolic (congestive) heart failure: Secondary | ICD-10-CM | POA: Diagnosis not present

## 2022-05-07 DIAGNOSIS — I509 Heart failure, unspecified: Secondary | ICD-10-CM | POA: Diagnosis not present

## 2022-05-07 DIAGNOSIS — E662 Morbid (severe) obesity with alveolar hypoventilation: Secondary | ICD-10-CM | POA: Diagnosis not present

## 2022-05-07 DIAGNOSIS — J9610 Chronic respiratory failure, unspecified whether with hypoxia or hypercapnia: Secondary | ICD-10-CM | POA: Diagnosis not present

## 2022-05-08 DIAGNOSIS — N2581 Secondary hyperparathyroidism of renal origin: Secondary | ICD-10-CM | POA: Diagnosis not present

## 2022-05-08 DIAGNOSIS — N186 End stage renal disease: Secondary | ICD-10-CM | POA: Diagnosis not present

## 2022-05-08 DIAGNOSIS — Z992 Dependence on renal dialysis: Secondary | ICD-10-CM | POA: Diagnosis not present

## 2022-05-08 DIAGNOSIS — T8249XA Other complication of vascular dialysis catheter, initial encounter: Secondary | ICD-10-CM | POA: Diagnosis not present

## 2022-05-11 DIAGNOSIS — T8249XA Other complication of vascular dialysis catheter, initial encounter: Secondary | ICD-10-CM | POA: Diagnosis not present

## 2022-05-11 DIAGNOSIS — Z992 Dependence on renal dialysis: Secondary | ICD-10-CM | POA: Diagnosis not present

## 2022-05-11 DIAGNOSIS — N2581 Secondary hyperparathyroidism of renal origin: Secondary | ICD-10-CM | POA: Diagnosis not present

## 2022-05-11 DIAGNOSIS — N186 End stage renal disease: Secondary | ICD-10-CM | POA: Diagnosis not present

## 2022-05-12 ENCOUNTER — Telehealth (HOSPITAL_COMMUNITY): Payer: Self-pay | Admitting: *Deleted

## 2022-05-12 ENCOUNTER — Other Ambulatory Visit (HOSPITAL_COMMUNITY): Payer: Self-pay | Admitting: *Deleted

## 2022-05-12 DIAGNOSIS — I5022 Chronic systolic (congestive) heart failure: Secondary | ICD-10-CM

## 2022-05-12 NOTE — Telephone Encounter (Signed)
-----   Message from Larey Dresser, MD sent at 05/12/2022 11:07 AM EDT ----- Regarding: RE: Cardiac Rehab That is fine.  Ok for SBP > 85 ----- Message ----- From: Rowe Pavy, RN Sent: 05/12/2022   9:07 AM EDT To: Larey Dresser, MD Subject: Cardiac Rehab                                   Dr. [name],  Received a referral for the above patient to participate in Cardiac Rehab. Unfortunately the referral was signed by APP.  Must be signed by MD for insurance reimbursement.  I have placed a referral pending  your electronic signature.  Pt does dialysis on T, Th and Sat.  Okay to proceed with exercise on MWF?  Any BP parameters specific for him?  Thanks Psychologist, clinical, BSN Cardiac and Training and development officer

## 2022-05-13 DIAGNOSIS — Z992 Dependence on renal dialysis: Secondary | ICD-10-CM | POA: Diagnosis not present

## 2022-05-13 DIAGNOSIS — N2581 Secondary hyperparathyroidism of renal origin: Secondary | ICD-10-CM | POA: Diagnosis not present

## 2022-05-13 DIAGNOSIS — N186 End stage renal disease: Secondary | ICD-10-CM | POA: Diagnosis not present

## 2022-05-13 DIAGNOSIS — T8249XA Other complication of vascular dialysis catheter, initial encounter: Secondary | ICD-10-CM | POA: Diagnosis not present

## 2022-05-15 DIAGNOSIS — T8249XA Other complication of vascular dialysis catheter, initial encounter: Secondary | ICD-10-CM | POA: Diagnosis not present

## 2022-05-15 DIAGNOSIS — I509 Heart failure, unspecified: Secondary | ICD-10-CM | POA: Diagnosis not present

## 2022-05-15 DIAGNOSIS — N186 End stage renal disease: Secondary | ICD-10-CM | POA: Diagnosis not present

## 2022-05-15 DIAGNOSIS — N2581 Secondary hyperparathyroidism of renal origin: Secondary | ICD-10-CM | POA: Diagnosis not present

## 2022-05-15 DIAGNOSIS — I5023 Acute on chronic systolic (congestive) heart failure: Secondary | ICD-10-CM | POA: Diagnosis not present

## 2022-05-15 DIAGNOSIS — Z992 Dependence on renal dialysis: Secondary | ICD-10-CM | POA: Diagnosis not present

## 2022-05-15 DIAGNOSIS — J9610 Chronic respiratory failure, unspecified whether with hypoxia or hypercapnia: Secondary | ICD-10-CM | POA: Diagnosis not present

## 2022-05-15 NOTE — Progress Notes (Unsigned)
HPI M former smoker, bus driver, followed for OSA, complicated by CHF, AFlutter, Chronic Hypoxic Respiratory Failure, GERD, Hidradenitis Suppurativa, Osteomyelitis, ESRD/Dialysis, Obesity,  NPSG 09/10/21 AHI 110.4/ hr, desaturation to 71%, Mean 88%,  Trilogy NIV  VTE 437 ml,, Avg IPAP 21.3, Avg EPAP 11.2/ [employer]    Download compliance - Used 83% of days in past month, average 7.4 hrs/ night  O2 1-2L/sleep [employer]   Walk Test 05/18/22 O2 qualifying-started at 97% RA/ 129 HR. After 1 lap he dropped to 86%/ 132. Remained 86% walking on 3L, required 5L continuous O2 walking for Sat 96%, HR 130.  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 Ended early-incomplete.  Epworth-9 Trilogy NIV  VTE 437 ml, used>4 hrs 76.5 % days, Avg IPAP 21.3, Avg EPAP 11.2/ [employer]     with O2 1-2L/sleep [employer]      Body weight today-409 lbs Covid vax-No Flu vax-No ------BIPAP. He has been pulled out of work. 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 Patient feels better since starting the BIPAP Now out of work a couple of weeks until we can give him a letter indicating he is okay to drive.  He understands we are waiting on the download.  Apparently he has a Copy which she says he uses with oxygen every night.  It makes him feel "a lot better".  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  I emphasized the importance of safe, alert driving. No ENT surgery.  Denies family history of sleep apnea.  Denies use of sleep medicines or caffeine. He is single, living alone.  05/18/22- 26 yoM former smoker, bus  driver, followed for OSA, complicated by CHF, AFlutter, Chronic Hypoxic Respiratory Failure, GERD, Hidradenitis Suppurativa, Osteomyelitis, ESRD/Dialysis, Obesity,  Trilogy NIV  VTE 437 ml,, Avg IPAP 21.3, Avg EPAP 11.2/ [employer]    Download compliance - Used 83% of days in past month, average 7.4 hrs/ night  O2 1-2L/sleep [employer]      Body weight today-401 lbs Covid vax-none Flu vax- declined Hosp 9/1-9/3  CHF, Viral illness, MSSA sepsis/ osteomyelitis Arrival HR 128, O2 saturation 88%, recovering with rest to 94%, quite dyspneic walking from exam room. He is aware his heart is out of rhythm again, pending cardiology visit in 2 days.  He anticipates that he may recommend cardioversion again. I suggested we do an oxygen qualifying walk test while he is here so that we have the data, even though an order for portable oxygen might depend on what Dr. Curt Bears decides to do. Walk Test 05/18/22 O2 qualifying-started at 97% RA/ 129 HR. After 1 lap he dropped to 86%/ 132. Remained 86% walking on 3L, required 5L continuous O2 walking for Sat 96%, HR 130. CXR 03/26/22 IMPRESSION: 1. Cardiomegaly with pulmonary vascular congestion. 2. Mild airspace disease at the lung bases, possible edema or infiltrate.  ROS-see HPI   + = positive Constitutional:    weight loss, night sweats, fevers, chills, fatigue, lassitude. HEENT:    headaches, difficulty swallowing, tooth/dental problems, sore throat,       sneezing, itching, ear ache, nasal congestion, post nasal drip, snoring CV:    chest pain, orthopnea, PND, swelling  in lower extremities, anasarca,         dizziness, palpitations Resp:  + shortness of breath with exertion or at rest.                +productive cough,   non-productive cough, coughing up of blood.              change in color of mucus.  wheezing.   Skin:    rash or lesions. GI:  No-   heartburn, indigestion, abdominal pain, nausea, vomiting, diarrhea,                 change in bowel habits, loss  of appetite GU: dysuria, change in color of urine, no urgency or frequency.   flank pain. MS:   joint pain, stiffness, decreased range of motion, back pain. Neuro-     nothing unusual Psych:  change in mood or affect.  depression or anxiety.   memory loss.  OBJ- Physical Exam General- Alert, Oriented, Affect-appropriate, Distress- none acute, +morbidly obese Skin- rash-none, lesions- none, excoriation- none Lymphadenopathy- none Head- atraumatic            Eyes- Gross vision intact, PERRLA, conjunctivae and secretions clear            Ears- Hearing, canals-normal            Nose- Clear, no-Septal dev, mucus, polyps, erosion, perforation             Throat- Mallampati III-IV, mucosa clear , drainage- none, tonsils- atrophic Neck- flexible , trachea midline, no stridor , thyroid nl, carotid no bruit Chest - symmetrical excursion , unlabored           Heart/CV- +IRR/ barely audible , no murmur , no gallop  , no rub, nl s1 s2                           - JVD- none , edema- none, stasis changes- none, varices- none           Lung- clear to P&A, wheeze- none, cough- none , dullness-none, rub- none           Chest wall- +HD access R upper chest Abd-  Br/ Gen/ Rectal- Not done, not indicated Extrem- cyanosis- none, clubbing, none, atrophy- none, strength- nl Neuro- grossly intact to observation

## 2022-05-18 ENCOUNTER — Ambulatory Visit (INDEPENDENT_AMBULATORY_CARE_PROVIDER_SITE_OTHER): Payer: BC Managed Care – PPO | Admitting: Internal Medicine

## 2022-05-18 ENCOUNTER — Encounter: Payer: Self-pay | Admitting: Internal Medicine

## 2022-05-18 DIAGNOSIS — J9611 Chronic respiratory failure with hypoxia: Secondary | ICD-10-CM | POA: Diagnosis not present

## 2022-05-18 DIAGNOSIS — Z992 Dependence on renal dialysis: Secondary | ICD-10-CM | POA: Diagnosis not present

## 2022-05-18 DIAGNOSIS — N2581 Secondary hyperparathyroidism of renal origin: Secondary | ICD-10-CM | POA: Diagnosis not present

## 2022-05-18 DIAGNOSIS — T8249XA Other complication of vascular dialysis catheter, initial encounter: Secondary | ICD-10-CM | POA: Diagnosis not present

## 2022-05-18 DIAGNOSIS — I5023 Acute on chronic systolic (congestive) heart failure: Secondary | ICD-10-CM | POA: Diagnosis not present

## 2022-05-18 DIAGNOSIS — G4733 Obstructive sleep apnea (adult) (pediatric): Secondary | ICD-10-CM

## 2022-05-18 DIAGNOSIS — N186 End stage renal disease: Secondary | ICD-10-CM | POA: Diagnosis not present

## 2022-05-18 DIAGNOSIS — I5082 Biventricular heart failure: Secondary | ICD-10-CM | POA: Diagnosis not present

## 2022-05-18 NOTE — Patient Instructions (Signed)
We can continue Trilogy NIV for sleep with O2 2l  Order- Walk test- POC qualifying   dx chronic respiratory failure with hypoxia  I hope you cardiology appointment on Thursday goes well.

## 2022-05-18 NOTE — Assessment & Plan Note (Addendum)
Walk test today O2 qualifying.  Because he is not in sinus rhythm now and has cardiology follow-up in 2 days, we can wait on decision for portable oxygen, especially since he is reluctant and sedentary.Marland Kitchen

## 2022-05-18 NOTE — Assessment & Plan Note (Signed)
Very limited exercise tolerance.  I do not see easy prospects for improvement.

## 2022-05-18 NOTE — Assessment & Plan Note (Signed)
He continues dialysis but clearly hopes that better heart function might improve this.

## 2022-05-18 NOTE — Assessment & Plan Note (Signed)
Trilogy NIV with O2 2 L.  Download indicates good compliance Plan-continue NIV support for sleep

## 2022-05-20 ENCOUNTER — Encounter: Payer: Self-pay | Admitting: Cardiology

## 2022-05-20 ENCOUNTER — Ambulatory Visit: Payer: BC Managed Care – PPO | Attending: Cardiology | Admitting: Cardiology

## 2022-05-20 VITALS — BP 128/62 | HR 135 | Ht 71.0 in | Wt >= 6400 oz

## 2022-05-20 DIAGNOSIS — D6869 Other thrombophilia: Secondary | ICD-10-CM | POA: Diagnosis not present

## 2022-05-20 DIAGNOSIS — I483 Typical atrial flutter: Secondary | ICD-10-CM

## 2022-05-20 DIAGNOSIS — I5022 Chronic systolic (congestive) heart failure: Secondary | ICD-10-CM

## 2022-05-20 DIAGNOSIS — N186 End stage renal disease: Secondary | ICD-10-CM | POA: Diagnosis not present

## 2022-05-20 DIAGNOSIS — T8249XA Other complication of vascular dialysis catheter, initial encounter: Secondary | ICD-10-CM | POA: Diagnosis not present

## 2022-05-20 DIAGNOSIS — N2581 Secondary hyperparathyroidism of renal origin: Secondary | ICD-10-CM | POA: Diagnosis not present

## 2022-05-20 DIAGNOSIS — Z992 Dependence on renal dialysis: Secondary | ICD-10-CM | POA: Diagnosis not present

## 2022-05-20 MED ORDER — AMIODARONE HCL 200 MG PO TABS: ORAL_TABLET | ORAL | 3 refills | Status: DC

## 2022-05-20 NOTE — Progress Notes (Signed)
Electrophysiology Office Note   Date:  05/20/2022   ID:  Phillip Young, DOB [birth year], MRN [id number]  PCP:  [name], MD  Cardiologist: [name] Primary Electrophysiologist:  Lametria Klunk Meredith Leeds, MD    Chief Complaint: CHF   History of Present Illness: Phillip Young is a 36 y.o. male who is being seen today for the evaluation of CHF at the request of Larey Dresser, MD. Presenting today for electrophysiology evaluation.  He has a history significant for cardiomyopathy and obesity as well as end-stage renal disease.  He was admitted to the hospital 04/17/2022 with heart failure after missing episodes of dialysis and had a flulike illness.  He underwent dialysis.  He was initially started on antibiotics but these were stopped as no source of infection was found.  He has had multiple episodes of admission for heart failure over the last year.  Today, denies symptoms of chest pain, orthopnea, PND, lower extremity edema, claudication, dizziness, presyncope, syncope, bleeding, or neurologic sequela. The patient is tolerating medications without difficulties.  3 weeks ago he feels that he went into atrial flutter.  He has no chest pain, but has noted shortness of breath and fatigue.  He states that he was volume overloaded at the time and he feels that that is what triggered his episode of atrial flutter.  He has had elevated LFTs in the past, and has been taken off of amiodarone.   Past Medical History:  Diagnosis Date   Acute on chronic respiratory failure with hypoxia ([place]) 04/21/2021   Acute on chronic systolic (congestive) heart failure ([name]) 02/26/2020   Biventricular congestive heart failure ([place])    Last Echo 11/2019 at [facility] reveals EF 20%   Class 3 severe obesity due to excess calories with serious comorbidity and body mass index (BMI) of 50.0 to 59.9 in adult [facility]) 02/26/2020   Essential hypertension 02/26/2020   GERD without esophagitis 02/26/2020   Hidradenitis  suppurativa 02/26/2020   OSA (obstructive sleep apnea) 02/26/2020   Prediabetes 02/26/2020   Renal disorder    Past Surgical History:  Procedure Laterality Date   ABSCESS DRAINAGE     AV FISTULA PLACEMENT Left 08/21/2021   Procedure: LEFT ARM ARTERIOVENOUS (AV) FISTULA.;  Surgeon: Serafina Mitchell, MD;  Location: [place];  Service: Vascular;  Laterality: Left;   CARDIOVERSION N/A 10/09/2021   Procedure: CARDIOVERSION;  Surgeon: Larey Dresser, MD;  Location: [place];  Service: Cardiovascular;  Laterality: N/A;   IR FLUORO GUIDE CV LINE RIGHT  03/10/2021   IR FLUORO GUIDE CV LINE RIGHT  04/22/2021   IR FLUORO GUIDE CV LINE RIGHT  08/20/2021   IR US GUIDE VASC ACCESS RIGHT  03/10/2021   IR US GUIDE VASC ACCESS RIGHT  04/22/2021   RIGHT HEART CATH N/A 03/06/2021   Procedure: RIGHT HEART CATH;  Surgeon: Jolaine Artist, MD;  Location: [place] CV LAB;  Service: Cardiovascular;  Laterality: N/A;   RIGHT/LEFT HEART CATH AND CORONARY ANGIOGRAPHY N/A 03/04/2020   Procedure: RIGHT/LEFT HEART CATH AND CORONARY ANGIOGRAPHY;  Surgeon: Larey Dresser, MD;  Location: [place] CV LAB;  Service: Cardiovascular;  Laterality: N/A;   TEE WITHOUT CARDIOVERSION N/A 05/05/2021   Procedure: TRANSESOPHAGEAL ECHOCARDIOGRAM (TEE);  Surgeon: Larey Dresser, MD;  Location: [facility] ENDOSCOPY;  Service: Cardiovascular;  Laterality: N/A;   TEMPORARY DIALYSIS CATHETER  03/06/2021   Procedure: TEMPORARY DIALYSIS CATHETER;  Surgeon: Jolaine Artist, MD;  Location: [place] CV LAB;  Service: Cardiovascular;;  Current Outpatient Medications  Medication Sig Dispense Refill   amiodarone (PACERONE) 200 MG tablet Take 2 tablets (400 mg total) TWICE daily for 2 weeks, then take 1 tablet (200 mg total) TWICE daily for 2 weeks, then take 1 tablet (200 mg total) ONCE daily 90 tablet 3   apixaban (ELIQUIS) 5 MG TABS tablet Take 1 tablet (5 mg total) by mouth 2 (two) times daily. 180 tablet 3   diclofenac Sodium  (VOLTAREN) 1 % GEL Apply 4 g topically 3 (three) times daily as needed (joint pains). 150 g 0   No current facility-administered medications for this visit.    Allergies:   Coreg [carvedilol] and Heparin   Social History:  The patient  reports that he quit smoking about 4 years ago. His smoking use included cigarettes. He smoked an average of 1 pack per day. He has been exposed to tobacco smoke. He has quit using smokeless tobacco. He reports that he does not drink alcohol and does not use drugs.   Family History:  The patient's family history includes Drug abuse in his father; Heart disease in his mother; Hypertension in his mother; Pulmonary Hypertension in his mother.   ROS:  Please see the history of present illness.   Otherwise, review of systems is positive for none.   All other systems are reviewed and negative.   PHYSICAL EXAM: VS:  BP 128/62   Pulse (!) 135   Ht 5\' 11"  (1.803 m)   Wt (!) 401 lb (181.9 kg)   SpO2 93%   BMI 55.93 kg/m  , BMI Body mass index is 55.93 kg/m. GEN: Well nourished, well developed, in no acute distress  HEENT: normal  Neck: no JVD, carotid bruits, or masses Cardiac: tachycardic; no murmurs, rubs, or gallops,no edema  Respiratory:  clear to auscultation bilaterally, normal work of breathing GI: soft, nontender, nondistended, + BS MS: no deformity or atrophy  Skin: warm and dry Neuro:  Strength and sensation are intact Psych: euthymic mood, full affect  EKG:  EKG is ordered today. Personal review of the ekg ordered shows atrial flutter  Recent Labs: 08/21/2021: TSH 3.217 02/18/2022: Magnesium 1.7 03/26/2022: B Natriuretic Peptide 374.2 03/27/2022: ALT 11 03/28/2022: BUN 31; Creatinine, Ser 7.87; Hemoglobin 9.3; Platelets 192; Potassium 3.5; Sodium 134    Lipid Panel     Component Value Date/Time   TRIG 137 07/29/2020 0140     Wt Readings from Last 3 Encounters:  05/20/22 (!) 401 lb (181.9 kg)  05/18/22 (!) 401 lb 6.4 oz (182.1 kg)   04/19/22 (!) 408 lb (185.1 kg)      Other studies Reviewed: Additional studies/ records that were reviewed today include: TTE 03/24/22 Review of the above records today demonstrates:   1. Left ventricular ejection fraction, by estimation, is 20 to 25%. The  left ventricle has severely decreased function. The left ventricle  demonstrates global hypokinesis. The left ventricular internal cavity size  was moderately dilated. Left  ventricular diastolic parameters were normal.   2. Right ventricular systolic function is normal. The right ventricular  size is mildly enlarged. There is severely elevated pulmonary artery  systolic pressure. The estimated right ventricular systolic pressure is  95.2 mmHg.   3. Left atrial size was mildly dilated.   4. Right atrial size was mildly dilated.   5. The mitral valve is normal in structure. Mild to moderate mitral valve  regurgitation. No evidence of mitral stenosis.   6. The aortic valve is normal in structure.  Aortic valve regurgitation is  not visualized. No aortic stenosis is present.   7. The inferior vena cava is normal in size with greater than 50%  respiratory variability, suggesting right atrial pressure of 3 mmHg.   Heart catheterization 03/04/2020 1. No significant CAD, nonischemic cardiomyopathy.  2. Filling pressures remain elevated.  3. Severe pulmonary venous hypertension.  4. Low but not markedly low cardiac output.    ASSESSMENT AND PLAN:  1.  Chronic systolic heart failure: Due to nonischemic cardiomyopathy.  Currently off medical therapy multiple hospitalizations for heart failure.  Currently on dialysis for volume control.  He has required midodrine during episodes of dialysis.  No obvious volume overload.  2.  Atrial fibrillation/flutter: Paroxysmal.  Has had multiple cardioversions.  Off of amiodarone due to elevated LFTs.  Continue Eliquis at the current dose.  CHA2DS2-VASc of 1.  He is unfortunately in atrial flutter  today.  With his history of heart failure, he needs to be in normal rhythm.  In discussion with his primary cardiologist, we Josseline Reddin start him on amiodarone today and plan for cardioversion.  3.  Obstructive sleep apnea: CPAP compliance encouraged  4.  End-stage renal disease: Currently on dialysis.  5.  Super morbid obesity: Lifestyle modification encouraged.  His obesity Xela Oregel likely preclude him from ICD therapy. Body mass index is 55.93 kg/m.  Procedure procedure  Current medicines are reviewed at length with the patient today.   The patient does not have concerns regarding his medicines.  The following changes were made today:  none  Labs/ tests ordered today include:  Orders Placed This Encounter  Procedures   EKG 12-Lead     Disposition:   FU with Burrell Hodapp as needed  Signed, Marvina Danner Meredith Leeds, MD  05/20/2022 12:54 PM     [facility] [address] [place] [address] [phone] (office) [phone] (fax)

## 2022-05-20 NOTE — Patient Instructions (Signed)
Medication Instructions:  Your physician has recommended you make the following change in your medication:  START Amiodarone  - take 2 tablets (400 mg total) TWICE a day for 2 weeks, then  - take 1 tablet (200 mg total) TWICE a day for 2 weeks, then  - take 1 tablet (200 mg total) ONCE a day   *If you need a refill on your cardiac medications before your next appointment, please call your pharmacy*   Lab Work: None ordered   Testing/Procedures: You will need a cardioversion in several weeks with Dr. [name].  We are sending them a message and they will call you to arrange this procedure.   Follow-Up: At [facility], you and your health needs are our priority.  As part of our continuing mission to provide you with exceptional heart care, we have created designated Provider Care Teams.  These Care Teams include your primary Cardiologist (physician) and Advanced Practice Providers (APPs -  Physician Assistants and Nurse Practitioners) who all work together to provide you with the care you need, when you need it.  Your next appointment:   As   needed  The format for your next appointment:   In Person  Provider:   Allegra Lai, MD    Thank you for choosing [facility]!!   Phillip Curet, RN [phone]  Other Instructions    Important Information About Sugar

## 2022-05-22 ENCOUNTER — Emergency Department (HOSPITAL_COMMUNITY): Payer: BC Managed Care – PPO

## 2022-05-22 ENCOUNTER — Encounter (HOSPITAL_COMMUNITY): Payer: Self-pay | Admitting: Emergency Medicine

## 2022-05-22 ENCOUNTER — Inpatient Hospital Stay (HOSPITAL_COMMUNITY)
Admission: EM | Admit: 2022-05-22 | Discharge: 2022-06-10 | DRG: 308 | Disposition: A | Discharge status: Home / Self Care | Payer: BC Managed Care – PPO | Attending: Internal Medicine | Admitting: Internal Medicine

## 2022-05-22 ENCOUNTER — Other Ambulatory Visit: Payer: Self-pay

## 2022-05-22 DIAGNOSIS — R590 Localized enlarged lymph nodes: Secondary | ICD-10-CM | POA: Diagnosis not present

## 2022-05-22 DIAGNOSIS — J189 Pneumonia, unspecified organism: Secondary | ICD-10-CM | POA: Diagnosis present

## 2022-05-22 DIAGNOSIS — K219 Gastro-esophageal reflux disease without esophagitis: Secondary | ICD-10-CM | POA: Diagnosis present

## 2022-05-22 DIAGNOSIS — Z8619 Personal history of other infectious and parasitic diseases: Secondary | ICD-10-CM

## 2022-05-22 DIAGNOSIS — R053 Chronic cough: Secondary | ICD-10-CM | POA: Diagnosis not present

## 2022-05-22 DIAGNOSIS — I4892 Unspecified atrial flutter: Secondary | ICD-10-CM | POA: Diagnosis present

## 2022-05-22 DIAGNOSIS — E872 Acidosis, unspecified: Secondary | ICD-10-CM | POA: Diagnosis not present

## 2022-05-22 DIAGNOSIS — E785 Hyperlipidemia, unspecified: Secondary | ICD-10-CM | POA: Diagnosis present

## 2022-05-22 DIAGNOSIS — J929 Pleural plaque without asbestos: Secondary | ICD-10-CM | POA: Diagnosis not present

## 2022-05-22 DIAGNOSIS — R700 Elevated erythrocyte sedimentation rate: Secondary | ICD-10-CM | POA: Diagnosis not present

## 2022-05-22 DIAGNOSIS — E1165 Type 2 diabetes mellitus with hyperglycemia: Secondary | ICD-10-CM | POA: Diagnosis present

## 2022-05-22 DIAGNOSIS — R34 Anuria and oliguria: Secondary | ICD-10-CM | POA: Diagnosis not present

## 2022-05-22 DIAGNOSIS — I509 Heart failure, unspecified: Secondary | ICD-10-CM | POA: Diagnosis not present

## 2022-05-22 DIAGNOSIS — D509 Iron deficiency anemia, unspecified: Secondary | ICD-10-CM | POA: Diagnosis present

## 2022-05-22 DIAGNOSIS — I5020 Unspecified systolic (congestive) heart failure: Secondary | ICD-10-CM | POA: Diagnosis not present

## 2022-05-22 DIAGNOSIS — J9621 Acute and chronic respiratory failure with hypoxia: Secondary | ICD-10-CM

## 2022-05-22 DIAGNOSIS — E662 Morbid (severe) obesity with alveolar hypoventilation: Secondary | ICD-10-CM | POA: Diagnosis not present

## 2022-05-22 DIAGNOSIS — I272 Pulmonary hypertension, unspecified: Secondary | ICD-10-CM | POA: Diagnosis present

## 2022-05-22 DIAGNOSIS — I270 Primary pulmonary hypertension: Secondary | ICD-10-CM | POA: Diagnosis not present

## 2022-05-22 DIAGNOSIS — I5022 Chronic systolic (congestive) heart failure: Secondary | ICD-10-CM | POA: Diagnosis present

## 2022-05-22 DIAGNOSIS — Z79899 Other long term (current) drug therapy: Secondary | ICD-10-CM

## 2022-05-22 DIAGNOSIS — E8889 Other specified metabolic disorders: Secondary | ICD-10-CM | POA: Diagnosis present

## 2022-05-22 DIAGNOSIS — Z6841 Body Mass Index (BMI) 40.0 and over, adult: Secondary | ICD-10-CM | POA: Diagnosis not present

## 2022-05-22 DIAGNOSIS — R918 Other nonspecific abnormal finding of lung field: Secondary | ICD-10-CM

## 2022-05-22 DIAGNOSIS — K761 Chronic passive congestion of liver: Secondary | ICD-10-CM | POA: Diagnosis present

## 2022-05-22 DIAGNOSIS — Z87891 Personal history of nicotine dependence: Secondary | ICD-10-CM | POA: Diagnosis not present

## 2022-05-22 DIAGNOSIS — K5900 Constipation, unspecified: Secondary | ICD-10-CM | POA: Diagnosis not present

## 2022-05-22 DIAGNOSIS — N2581 Secondary hyperparathyroidism of renal origin: Secondary | ICD-10-CM | POA: Diagnosis present

## 2022-05-22 DIAGNOSIS — I132 Hypertensive heart and chronic kidney disease with heart failure and with stage 5 chronic kidney disease, or end stage renal disease: Secondary | ICD-10-CM | POA: Diagnosis not present

## 2022-05-22 DIAGNOSIS — J9610 Chronic respiratory failure, unspecified whether with hypoxia or hypercapnia: Secondary | ICD-10-CM | POA: Diagnosis not present

## 2022-05-22 DIAGNOSIS — J811 Chronic pulmonary edema: Secondary | ICD-10-CM | POA: Diagnosis not present

## 2022-05-22 DIAGNOSIS — X58XXXA Exposure to other specified factors, initial encounter: Secondary | ICD-10-CM | POA: Diagnosis present

## 2022-05-22 DIAGNOSIS — D689 Coagulation defect, unspecified: Secondary | ICD-10-CM | POA: Diagnosis not present

## 2022-05-22 DIAGNOSIS — I5043 Acute on chronic combined systolic (congestive) and diastolic (congestive) heart failure: Secondary | ICD-10-CM | POA: Diagnosis not present

## 2022-05-22 DIAGNOSIS — I428 Other cardiomyopathies: Secondary | ICD-10-CM | POA: Diagnosis present

## 2022-05-22 DIAGNOSIS — D508 Other iron deficiency anemias: Secondary | ICD-10-CM

## 2022-05-22 DIAGNOSIS — Z992 Dependence on renal dialysis: Secondary | ICD-10-CM | POA: Diagnosis not present

## 2022-05-22 DIAGNOSIS — I700 Atherosclerosis of aorta: Secondary | ICD-10-CM | POA: Diagnosis not present

## 2022-05-22 DIAGNOSIS — R945 Abnormal results of liver function studies: Secondary | ICD-10-CM | POA: Diagnosis not present

## 2022-05-22 DIAGNOSIS — E871 Hypo-osmolality and hyponatremia: Secondary | ICD-10-CM | POA: Diagnosis not present

## 2022-05-22 DIAGNOSIS — K7200 Acute and subacute hepatic failure without coma: Secondary | ICD-10-CM | POA: Diagnosis not present

## 2022-05-22 DIAGNOSIS — I340 Nonrheumatic mitral (valve) insufficiency: Secondary | ICD-10-CM | POA: Diagnosis present

## 2022-05-22 DIAGNOSIS — I959 Hypotension, unspecified: Secondary | ICD-10-CM | POA: Diagnosis present

## 2022-05-22 DIAGNOSIS — I4819 Other persistent atrial fibrillation: Secondary | ICD-10-CM | POA: Diagnosis not present

## 2022-05-22 DIAGNOSIS — I5082 Biventricular heart failure: Secondary | ICD-10-CM | POA: Diagnosis present

## 2022-05-22 DIAGNOSIS — R110 Nausea: Secondary | ICD-10-CM | POA: Diagnosis not present

## 2022-05-22 DIAGNOSIS — Z91148 Patient's other noncompliance with medication regimen for other reason: Secondary | ICD-10-CM

## 2022-05-22 DIAGNOSIS — E1122 Type 2 diabetes mellitus with diabetic chronic kidney disease: Secondary | ICD-10-CM | POA: Diagnosis not present

## 2022-05-22 DIAGNOSIS — S36119A Unspecified injury of liver, initial encounter: Secondary | ICD-10-CM | POA: Diagnosis present

## 2022-05-22 DIAGNOSIS — R7982 Elevated C-reactive protein (CRP): Secondary | ICD-10-CM

## 2022-05-22 DIAGNOSIS — Z20822 Contact with and (suspected) exposure to covid-19: Secondary | ICD-10-CM | POA: Diagnosis not present

## 2022-05-22 DIAGNOSIS — I4891 Unspecified atrial fibrillation: Secondary | ICD-10-CM | POA: Diagnosis not present

## 2022-05-22 DIAGNOSIS — R748 Abnormal levels of other serum enzymes: Secondary | ICD-10-CM | POA: Diagnosis not present

## 2022-05-22 DIAGNOSIS — I483 Typical atrial flutter: Principal | ICD-10-CM | POA: Diagnosis present

## 2022-05-22 DIAGNOSIS — J9601 Acute respiratory failure with hypoxia: Secondary | ICD-10-CM | POA: Diagnosis present

## 2022-05-22 DIAGNOSIS — N186 End stage renal disease: Secondary | ICD-10-CM

## 2022-05-22 DIAGNOSIS — R079 Chest pain, unspecified: Secondary | ICD-10-CM | POA: Diagnosis not present

## 2022-05-22 DIAGNOSIS — Z91158 Patient's noncompliance with renal dialysis for other reason: Secondary | ICD-10-CM

## 2022-05-22 DIAGNOSIS — I5023 Acute on chronic systolic (congestive) heart failure: Secondary | ICD-10-CM | POA: Diagnosis not present

## 2022-05-22 DIAGNOSIS — Z8249 Family history of ischemic heart disease and other diseases of the circulatory system: Secondary | ICD-10-CM

## 2022-05-22 DIAGNOSIS — I517 Cardiomegaly: Secondary | ICD-10-CM | POA: Diagnosis not present

## 2022-05-22 DIAGNOSIS — Z7901 Long term (current) use of anticoagulants: Secondary | ICD-10-CM

## 2022-05-22 DIAGNOSIS — I484 Atypical atrial flutter: Secondary | ICD-10-CM | POA: Diagnosis not present

## 2022-05-22 DIAGNOSIS — R059 Cough, unspecified: Secondary | ICD-10-CM | POA: Diagnosis not present

## 2022-05-22 DIAGNOSIS — G4733 Obstructive sleep apnea (adult) (pediatric): Secondary | ICD-10-CM | POA: Diagnosis present

## 2022-05-22 DIAGNOSIS — R55 Syncope and collapse: Secondary | ICD-10-CM | POA: Diagnosis not present

## 2022-05-22 DIAGNOSIS — R591 Generalized enlarged lymph nodes: Secondary | ICD-10-CM | POA: Diagnosis not present

## 2022-05-22 DIAGNOSIS — Z888 Allergy status to other drugs, medicaments and biological substances status: Secondary | ICD-10-CM

## 2022-05-22 DIAGNOSIS — R0602 Shortness of breath: Secondary | ICD-10-CM | POA: Diagnosis not present

## 2022-05-22 LAB — I-STAT VENOUS BLOOD GAS, ED
Acid-base deficit: 5 mmol/L — ABNORMAL HIGH (ref 0.0–2.0)
Bicarbonate: 18.3 mmol/L — ABNORMAL LOW (ref 20.0–28.0)
Calcium, Ion: 1.07 mmol/L — ABNORMAL LOW (ref 1.15–1.40)
HCT: 32 % — ABNORMAL LOW (ref 39.0–52.0)
Hemoglobin: 10.9 g/dL — ABNORMAL LOW (ref 13.0–17.0)
O2 Saturation: 86 %
Potassium: 4.7 mmol/L (ref 3.5–5.1)
Sodium: 132 mmol/L — ABNORMAL LOW (ref 135–145)
TCO2: 19 mmol/L — ABNORMAL LOW (ref 22–32)
pCO2, Ven: 29.2 mmHg — ABNORMAL LOW (ref 44–60)
pH, Ven: 7.405 (ref 7.25–7.43)
pO2, Ven: 51 mmHg — ABNORMAL HIGH (ref 32–45)

## 2022-05-22 LAB — RESP PANEL BY RT-PCR (FLU A&B, COVID) ARPGX2
Influenza A by PCR: NEGATIVE
Influenza B by PCR: NEGATIVE
SARS Coronavirus 2 by RT PCR: NEGATIVE

## 2022-05-22 LAB — BASIC METABOLIC PANEL
Anion gap: 17 — ABNORMAL HIGH (ref 5–15)
BUN: 51 mg/dL — ABNORMAL HIGH (ref 6–20)
CO2: 18 mmol/L — ABNORMAL LOW (ref 22–32)
Calcium: 9.6 mg/dL (ref 8.9–10.3)
Chloride: 97 mmol/L — ABNORMAL LOW (ref 98–111)
Creatinine, Ser: 11.59 mg/dL — ABNORMAL HIGH (ref 0.61–1.24)
GFR, Estimated: 5 mL/min — ABNORMAL LOW (ref >60)
Glucose, Bld: 140 mg/dL — ABNORMAL HIGH (ref 70–99)
Potassium: 4.5 mmol/L (ref 3.5–5.1)
Sodium: 132 mmol/L — ABNORMAL LOW (ref 135–145)

## 2022-05-22 LAB — HEPATITIS B SURFACE ANTIGEN: Hepatitis B Surface Ag: NONREACTIVE

## 2022-05-22 LAB — TROPONIN I (HIGH SENSITIVITY)
Troponin I (High Sensitivity): 36 ng/L — ABNORMAL HIGH (ref <18)
Troponin I (High Sensitivity): 38 ng/L — ABNORMAL HIGH (ref <18)

## 2022-05-22 LAB — LACTIC ACID, PLASMA: Lactic Acid, Venous: 1.8 mmol/L (ref 0.5–1.9)

## 2022-05-22 LAB — CBC
HCT: 30.8 % — ABNORMAL LOW (ref 39.0–52.0)
Hemoglobin: 10.6 g/dL — ABNORMAL LOW (ref 13.0–17.0)
MCH: 35.1 pg — ABNORMAL HIGH (ref 26.0–34.0)
MCHC: 34.4 g/dL (ref 30.0–36.0)
MCV: 102 fL — ABNORMAL HIGH (ref 80.0–100.0)
Platelets: 263 10*3/uL (ref 150–400)
RBC: 3.02 MIL/uL — ABNORMAL LOW (ref 4.22–5.81)
RDW: 14.6 % (ref 11.5–15.5)
WBC: 12.2 10*3/uL — ABNORMAL HIGH (ref 4.0–10.5)
nRBC: 0 % (ref 0.0–0.2)

## 2022-05-22 LAB — BRAIN NATRIURETIC PEPTIDE: B Natriuretic Peptide: 373.1 pg/mL — ABNORMAL HIGH (ref 0.0–100.0)

## 2022-05-22 MED ORDER — MIDODRINE HCL 5 MG PO TABS
10.0000 mg | ORAL_TABLET | Freq: Three times a day (TID) | ORAL | Status: DC
  Administered 2022-05-22 – 2022-06-01 (×28): 10 mg via ORAL
  Filled 2022-05-22 (×33): qty 2

## 2022-05-22 MED ORDER — ONDANSETRON HCL 4 MG/2ML IJ SOLN
4.0000 mg | Freq: Once | INTRAMUSCULAR | Status: AC
  Administered 2022-05-22: 4 mg via INTRAVENOUS
  Filled 2022-05-22: qty 2

## 2022-05-22 MED ORDER — ETOMIDATE 2 MG/ML IV SOLN
INTRAVENOUS | Status: AC | PRN
  Administered 2022-05-22: 15 mg via INTRAVENOUS

## 2022-05-22 MED ORDER — SENNOSIDES-DOCUSATE SODIUM 8.6-50 MG PO TABS
1.0000 | ORAL_TABLET | Freq: Every evening | ORAL | Status: DC | PRN
  Administered 2022-05-26: 1 via ORAL
  Filled 2022-05-22: qty 1

## 2022-05-22 MED ORDER — ACETAMINOPHEN 650 MG RE SUPP: 650.0000 mg | Freq: Four times a day (QID) | RECTAL | Status: DC | PRN

## 2022-05-22 MED ORDER — IOHEXOL 350 MG/ML SOLN
80.0000 mL | Freq: Once | INTRAVENOUS | Status: AC | PRN
  Administered 2022-05-22: 80 mL via INTRAVENOUS

## 2022-05-22 MED ORDER — ACETAMINOPHEN 325 MG PO TABS: 650.0000 mg | ORAL_TABLET | Freq: Four times a day (QID) | ORAL | Status: DC | PRN

## 2022-05-22 MED ORDER — ETOMIDATE 2 MG/ML IV SOLN
15.0000 mg | Freq: Once | INTRAVENOUS | Status: AC
  Administered 2022-05-28: 10 mg via INTRAVENOUS
  Filled 2022-05-22: qty 10

## 2022-05-22 NOTE — ED Notes (Signed)
Patient transported to CT 

## 2022-05-22 NOTE — ED Notes (Signed)
BP currently 91/75.  PA Groce aware and at bedside.  No new orders at this time.

## 2022-05-22 NOTE — Hospital Course (Addendum)
10/29: Slept well last night Short of breath this morning  Feels better today compared to yesterday (a lot better) No palpitations, dizziness, lightheadedness  Only took off 2.5 L, goal was 4 L  States that he needs to be slowly weaned off o2   Add obesity hypoventilation syndrome to A/P  Last sleep study in feb  10/30 Feel SOB when standing up.  Feel lightheaded, dizzy Coughing with wheezing during interview  Discussed O2 level decreased 2/2 volume overload  Discussed HD will likely improve symptoms Discussed anemia contributing to sx and iron deficiency -placed order for IV iron   11/1: coughing up brown mucus all night. Did not sleep well. Very lightheaded and stomach pain when got up. Plan to repeat chest xray. Gets tachycardic and desaturates when stands up. AMA form noted in room, says it was from admission when he was annoyed at being NPO.Change to heart healthy from renal diet. Constipated, last BM day before yesterday, usually 2-3x / day plan to add miralax and senna   11/6  Will consider transfer Discussed fluid overload being the bigger concern over the stroke risk Doesn't like the bb even when starting on it in 2019  11/8 Talked about limited options, will talk to EP.  Doesn't like his heart failure team due to [facility] tox Doesn't feel comfortable with heart failure running the show Discussed [facility], comfort with [place] but we cannot ablate his heart or replace his lungs  Wants another cardioversion........ lets buzz him as much as he wants   Scheduled for HFU with NM on 12/12.    11/9  6-7L initially with good sats Turned to 5L with sats down to 90 with coughing fit, mouth breathing but also good stable breathing. Eventually rose to 98 Encouraged to take midodrine     11/10  Said bipap wasn't working well last night, woke up gasping, dizzy spells helped with air in his face Talked again about issues with metoprolol and had not taken that high of a dose before   Still apppreciative of our help  Vomited but zofran worked.    WANTS DAN AT CARDIOVERSION   11/15 Feeling better overall, turned down to 4 L.  [place] looks good although suboptimal

## 2022-05-22 NOTE — Procedures (Signed)
Electrical Cardioversion Procedure Note Nickoli Bagheri [id number] [birth year]  Procedure: Electrical Cardioversion Indications:  AFL and hypotension < 80 mm Hg  Time Out: Verified patient identification, verified procedure,medications/allergies/relevent history reviewed, required imaging and test results available.  Performed  Procedure Details  The patient was NPO after midnight. Anesthesia was administered at the beside  by Dr.Floyd.  Cardioversion was done with synchronized biphasic defibrillation with AP pads with 200 Joules.  The patient converted to sinus tachycardia with change in P wave morpology  IMPRESSION:  Successful cardioversion of atrial fibrillation    Letoya Stallone A Alaynah Schutter 05/22/2022, 12:46 PM

## 2022-05-22 NOTE — Consult Note (Addendum)
[facility] Renal Consultation Note    Indication for Consultation:  Management of ESRD/hemodialysis; anemia, hypertension/volume and secondary hyperparathyroidism   HPI: Phillip Young is a 36 y.o. male with ESRD on HD, HFrEF, Afib on Eliquis, morbid obesity who presented to the ED with feeling poorly since Friday.  Reports fatigue, malaise, weakness since Friday. EP started amiodarone at visit on 10/26. Also states that he has felt like his heart has been racing for the past 3 weeks. Found to be in [place] with RVR on arrival. Cardiology was consulted and underwent successful cardioversion in the ED.  Labs: Na 132, K 4.5, CO2 18, WBC 12.2, Hgb 10.6.   Seen and examined in the ED. On 4L [place].  Does endorse SOB and cough. CXR with pulm vascular congestion. Due for routine dialysis today. Usually 4-5L UF goals with dialysis. Using [facility], has been hesitant about getting fistula placed. Now seems ready to proceed -has outpatient appt with vascular next week.   Past Medical History:  Diagnosis Date   Acute on chronic respiratory failure with hypoxia ([place]) 04/21/2021   Acute on chronic systolic (congestive) heart failure ([name]) 02/26/2020   Biventricular congestive heart failure ([place])    Last Echo 11/2019 at [facility] reveals EF 20%   Class 3 severe obesity due to excess calories with serious comorbidity and body mass index (BMI) of 50.0 to 59.9 in adult [facility]) 02/26/2020   Essential hypertension 02/26/2020   GERD without esophagitis 02/26/2020   Hidradenitis suppurativa 02/26/2020   OSA (obstructive sleep apnea) 02/26/2020   Prediabetes 02/26/2020   Renal disorder    Past Surgical History:  Procedure Laterality Date   ABSCESS DRAINAGE     AV FISTULA PLACEMENT Left 08/21/2021   Procedure: LEFT ARM ARTERIOVENOUS (AV) FISTULA.;  Surgeon: Serafina Mitchell, MD;  Location: [place];  Service: Vascular;  Laterality: Left;   CARDIOVERSION N/A 10/09/2021   Procedure: CARDIOVERSION;  Surgeon:  Larey Dresser, MD;  Location: [place];  Service: Cardiovascular;  Laterality: N/A;   IR FLUORO GUIDE CV LINE RIGHT  03/10/2021   IR FLUORO GUIDE CV LINE RIGHT  04/22/2021   IR FLUORO GUIDE CV LINE RIGHT  08/20/2021   IR US GUIDE VASC ACCESS RIGHT  03/10/2021   IR US GUIDE VASC ACCESS RIGHT  04/22/2021   RIGHT HEART CATH N/A 03/06/2021   Procedure: RIGHT HEART CATH;  Surgeon: Jolaine Artist, MD;  Location: [place] CV LAB;  Service: Cardiovascular;  Laterality: N/A;   RIGHT/LEFT HEART CATH AND CORONARY ANGIOGRAPHY N/A 03/04/2020   Procedure: RIGHT/LEFT HEART CATH AND CORONARY ANGIOGRAPHY;  Surgeon: Larey Dresser, MD;  Location: [place] CV [facility];  Service: Cardiovascular;  Laterality: N/A;   TEE WITHOUT CARDIOVERSION N/A 05/05/2021   Procedure: TRANSESOPHAGEAL ECHOCARDIOGRAM (TEE);  Surgeon: Larey Dresser, MD;  Location: [facility] ENDOSCOPY;  Service: Cardiovascular;  Laterality: N/A;   TEMPORARY DIALYSIS CATHETER  03/06/2021   Procedure: TEMPORARY DIALYSIS CATHETER;  Surgeon: Jolaine Artist, MD;  Location: [place] CV LAB;  Service: Cardiovascular;;   Family History  Problem Relation Age of Onset   Heart disease Mother    Hypertension Mother    Pulmonary Hypertension Mother    Drug abuse Father        died due to Heroin overdose   Social History:  reports that he quit smoking about 4 years ago. His smoking use included cigarettes. He smoked an average of 1 pack per day. He has been exposed to tobacco smoke.  He has quit using smokeless tobacco. He reports that he does not drink alcohol and does not use drugs. 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 Authorizing Provider  amiodarone (PACERONE) 200 MG tablet Take 2 tablets (400 mg total) TWICE daily for 2  weeks, then take 1 tablet (200 mg total) TWICE daily for 2 weeks, then take 1 tablet (200 mg total) ONCE daily 05/20/22   Camnitz, Ocie Doyne, MD  apixaban (ELIQUIS) 5 MG TABS tablet Take 1 tablet (5 mg total) by mouth 2 (two) times daily. 12/16/21   Larey Dresser, MD  diclofenac Sodium (VOLTAREN) 1 % GEL Apply 4 g topically 3 (three) times daily as needed (joint pains). 03/28/22   Lacinda Axon, MD   Current Facility-Administered Medications  Medication Dose Route Frequency Provider Last Rate Last Admin   etomidate (AMIDATE) injection 15 mg  15 mg Intravenous Once Deno Etienne, DO       midodrine (PROAMATINE) tablet 10 mg  10 mg Oral TID WC Genevive Bi F, PA-C   10 mg at 05/22/22 1151   Current Outpatient Medications  Medication Sig Dispense Refill   amiodarone (PACERONE) 200 MG tablet Take 2 tablets (400 mg total) TWICE daily for 2 weeks, then take 1 tablet (200 mg total) TWICE daily for 2 weeks, then take 1 tablet (200 mg total) ONCE daily 90 tablet 3   apixaban (ELIQUIS) 5 MG TABS tablet Take 1 tablet (5 mg total) by mouth 2 (two) times daily. 180 tablet 3   diclofenac Sodium (VOLTAREN) 1 % GEL Apply 4 g topically 3 (three) times daily as needed (joint pains). 150 g 0     ROS: As per HPI otherwise negative.  Physical Exam: Vitals:   05/22/22 1246 05/22/22 1251 05/22/22 1255 05/22/22 1300  BP: (!) 107/56 (!) 83/68 (!) 87/63 97/65  Pulse: 97 98 (!) 102 99  Resp: (!) 28 (!) 29 (!) 30 (!) 27  Temp:      TempSrc:      SpO2: 94% 99% 97% 92%     General: Obese man, on nasal oxygent, nad  Head: NCAT sclera not icteric MMM Neck: Supple. Unable to assess JVD  Lungs: Clear bilaterally without wheezes, rales, or rhonchi.  Heart: Irregular Abdomen: soft non-tender no masses  Lower extremities: 1+ LE edema  Neuro: A & O X 3. Moves all extremities spontaneously. Psych:  Responds to questions appropriately with a normal affect. Dialysis Access: [facility]   Labs: Basic Metabolic  Panel: Recent Labs  Lab 05/22/22 0732  NA 132*  K 4.5  CL 97*  CO2 18*  GLUCOSE 140*  BUN 51*  CREATININE 11.59*  CALCIUM 9.6   Liver Function Tests: No results for input(s): "AST", "ALT", "ALKPHOS", "BILITOT", "PROT", "ALBUMIN" in the last 168 hours. No results for input(s): "LIPASE", "AMYLASE" in the last 168 hours. No results for input(s): "AMMONIA" in the last 168 hours. CBC: Recent Labs  Lab 05/22/22 0732  WBC 12.2*  HGB 10.6*  HCT 30.8*  MCV 102.0*  PLT 263   Cardiac Enzymes: No results for input(s): "CKTOTAL", "CKMB", "CKMBINDEX", "TROPONINI" in the last 168 hours. CBG: No results for input(s): "GLUCAP" in the last 168 hours. Iron Studies: No results for input(s): "IRON", "TIBC", "TRANSFERRIN", "FERRITIN"  in the last 72 hours. Studies/Results: CT Angio Chest PE W and/or Wo Contrast  Result Date: 05/22/2022 CLINICAL DATA:  Chest pain and shortness of breath. Evaluate for pulmonary embolism. EXAM: CT ANGIOGRAPHY CHEST WITH CONTRAST TECHNIQUE: Multidetector CT imaging of the chest was performed using the standard protocol during bolus administration of intravenous contrast. Multiplanar CT image reconstructions and MIPs were obtained to evaluate the vascular anatomy. RADIATION DOSE REDUCTION: This exam was performed according to the departmental dose-optimization program which includes automated exposure control, adjustment of the mA and/or kV according to patient size and/or use of iterative reconstruction technique. CONTRAST:  73mL OMNIPAQUE IOHEXOL 350 MG/ML SOLN COMPARISON:  August 18, 2021 CT of the chest. Chest x-ray May 22, 2022. FINDINGS: Cardiovascular: The thoracic aorta is normal in caliber. No aneurysm or dissection identified. No atherosclerotic change. Cardiomegaly is stable. No pulmonary artery dilatation identified. The attenuation of the opacified pulmonary artery is 245 Hounsfield units demonstrating a moderate quality study. The main and lobar pulmonary  arteries are well opacified. Evaluation at the level of the segmental pulmonary arteries and distal is limited due to artifact. Within this limitation, no pulmonary emboli are identified. Mediastinum/Nodes: Mediastinal adenopathy remains a right paratracheal node on series 8, image 44 measures 2 cm in diameter today, stable. An AP window node on series 8, image 48 measures 2.4 cm today versus 2.2 cm on the August 18, 2021 study and 2.2 cm on the October 09, 2020 study. Bilateral hilar adenopathy is similar in the interval. Overall, the adenopathy is stable to slightly more prominent in the interval. The thyroid and esophagus are normal. No pleural effusions or pericardial effusions noted. Bilateral gynecomastia is identified. No other chest wall abnormalities are noted. Lungs/Pleura: Evaluation of the lungs is limited due to patient body habitus. Diffuse ground-glass opacity is identified in the lungs with a few regions of decreased attenuation, likely air trapping. A calcified nodules identified in the left lung base, of no significance. No suspicious pulmonary nodules or masses are identified. Upper Abdomen: No acute abnormality. Musculoskeletal: No chest wall abnormality. No acute or significant osseous findings. Review of the MIP images confirms the above findings. IMPRESSION: 1. No pulmonary emboli identified in the main or lobar pulmonary arteries. Evaluation at and beyond the segmental level is limited. 2. Cardiomegaly. 3. Diffuse ground-glass throughout the lungs suggest pulmonary venous congestion. Regions of decreased attenuation suggests air trapping as well. Recommend clinical correlation. 4. Adenopathy in the mediastinum and hila is stable to mildly more prominent the interval. This finding is nonspecific but has been present since February 19, 2020. Electronically Signed   By: Dorise Bullion III M.D.   On: 05/22/2022 11:07   DG Chest 2 View  Result Date: 05/22/2022 CLINICAL DATA:  36 year old male  with chest pain and shortness of breath. EXAM: CHEST - 2 VIEW COMPARISON:  Chest radiographs 03/26/2022 and earlier. FINDINGS: PA and lateral views at 0818 hours. Stable cardiomegaly and mediastinal contours. Stable right chest dual lumen dialysis type catheter. Visualized tracheal air column is within normal limits. Trace fluid now in the pleural fissures appears new since September. But pulmonary vascularity appears stable. No dependent pleural effusion. No consolidation or confluent opacity. No acute osseous abnormality identified. Negative visible bowel gas. IMPRESSION: Chronic cardiomegaly with trace new fluid in the pleural fissures. Stable pulmonary vascularity but still, consider mild or developing interstitial edema. Electronically Signed   By: Genevie Ann M.D.   On: 05/22/2022 08:42    Dialysis Orders:  Unit: [facility]  TTS Time: 4:15  EDW: 180 kg Flows: 425/A1.5x Bath: 3K/2.5Ca  Access: TDC  Heparin: None/HIT Ab POSITIVE -Mircera 120 q 4 wks (last 10/12) -Hectorol 6 TIW, Sensipar 30 TIW  Assessment/Plan: AFlutter/RVR - s/p cardioversion. On amiodarone. Per primary/cardiology  ESRD -  HD TTS. Continue on schedule. HD today.  BP/Volume  - Chronic volume overload. Tolerates 4-5L UF goals. Midodrine 10 TID for BP support. Torsemide 100 non dialysis days Anemia  - Hgb stable. On ESA as outpatient  Metabolic bone disease -  Continue Forsrenol binder/Sensipar/Hectorol  if admitted  HFrEF   Kalai Baca Larina Earthly PA-C [facility] 05/22/2022, 1:15 PM

## 2022-05-22 NOTE — ED Notes (Signed)
Patient sitting on stool at bedside table. Patient educated on why not to sit on stool and poss of falling. Patient stated "I will not fall, I am just trying to relieve my stomach of pain." MD paged again. Will continue to monitor

## 2022-05-22 NOTE — ED Provider Notes (Signed)
.  Sedation  Date/Time: 05/22/2022 1:21 PM  Performed by: Deno Etienne, DO Authorized by: Deno Etienne, DO   Consent:    Consent obtained:  Verbal and written   Consent given by:  Patient   Risks discussed:  Allergic reaction, dysrhythmia, inadequate sedation, nausea, vomiting, respiratory compromise necessitating ventilatory assistance and intubation and prolonged hypoxia resulting in organ damage   Alternatives discussed:  Analgesia without sedation Universal protocol:    Procedure explained and questions answered to patient or proxy's satisfaction: yes     Immediately prior to procedure, a time out was called: yes     Patient identity confirmed:  Anonymous protocol, patient vented/unresponsive and arm band Indications:    Procedure performed:  Cardioversion   Procedure necessitating sedation performed by:  Different physician Pre-sedation assessment:    Time since last food or drink:  6   ASA classification: class 3 - patient with severe systemic disease     Mouth opening:  2 finger widths   Thyromental distance:  3 finger widths   Mallampati score:  II - soft palate, uvula, fauces visible   Neck mobility: normal     Pre-sedation assessments completed and reviewed: airway patency, cardiovascular function, hydration status, mental status, nausea/vomiting, pain level, respiratory function and temperature   Immediate pre-procedure details:    Reassessment: Patient reassessed immediately prior to procedure     Reviewed: vital signs     Verified: bag valve mask available, emergency equipment available, intubation equipment available, IV patency confirmed, oxygen available and suction available   Procedure details (see MAR for exact dosages):    Preoxygenation:  Nasal cannula   Sedation:  Propofol   Intended level of sedation: moderate (conscious sedation)   Analgesia:  None   Intra-procedure monitoring:  Cardiac monitor, blood pressure monitoring, continuous capnometry, continuous pulse  oximetry, frequent LOC assessments and frequent vital sign checks   Intra-procedure events: none     Intra-procedure management:  Airway repositioning and supplemental oxygen   Total Provider sedation time (minutes):  35 Post-procedure details:    Attendance: Constant attendance by certified staff until patient recovered     Recovery: Patient returned to pre-procedure baseline     Post-sedation assessments completed and reviewed: airway patency, cardiovascular function, hydration status, mental status, nausea/vomiting, pain level, respiratory function and temperature     Patient is stable for discharge or admission: yes     Procedure completion:  Tolerated well, no immediate complications .Critical Care  Performed by: Deno Etienne, DO Authorized by: Deno Etienne, DO   Critical care provider statement:    Critical care time (minutes):  35   Critical care time was exclusive of:  Separately billable procedures and treating other patients   Critical care was time spent personally by me on the following activities:  Development of treatment plan with patient or surrogate, discussions with consultants, evaluation of patient's response to treatment, examination of patient, ordering and review of laboratory studies, ordering and review of radiographic studies, ordering and performing treatments and interventions, pulse oximetry, re-evaluation of patient's condition and review of old charts   Care discussed with: admitting provider       Deno Etienne, DO 05/23/22 0654

## 2022-05-22 NOTE — Consult Note (Signed)
NAME:  Phillip Young, MRN:  [id number], DOB:  08-May-1986, LOS: 0 ADMISSION DATE:  05/22/2022, CONSULTATION DATE:  05/22/22 REFERRING MD:  Tyrone Nine, CHIEF COMPLAINT:  SOB   History of Present Illness:  36 year old man well known to our service from prior admission who presents with worsening SOB.  ESRD, reduced EF, morbid obesity, OHS presenting with weight gain, orthopnea, PND, palpitations found to be in afib with hypotension.  Cardioverted and Bps have recovered okay.   He is still dyspneic.   Some concern regarding his BP so PCCM consulted to consider admission  Pertinent  Medical History  Biventricular heart failure ESRD OSA Class V obesity GERD HTN DM2  Significant Hospital Events: Including procedures, antibiotic start and stop dates in addition to other pertinent events   10/28 admit, cardioversion  Interim History / Subjective:  Consulted  Objective   Blood pressure (!) 116/104, pulse 99, temperature 97.8 F (36.6 C), temperature source Oral, resp. rate (!) 27, SpO2 99 %.       No intake or output data in the 24 hours ending 05/22/22 1345 There were no vitals filed for this visit.  Examination: General: Sitting up in bed in mild resp distress HENT: [place]amp[place] 4, MMM Lungs: crackles bilaterally, occasional accessory muscle use Cardiovascular: heart sounds regular, ext warm, sinus on monitor Abdomen: soft, hypoactive BS Extremities: marked anasarca vs. Adipose tissue, probably a combination Neuro: Moves all 4 ext to command Skin: No rashes  CXR wet CT confirms edema, some reactive adenopathy could consider working up at later date  Resolved Hospital Problem list   N/A  Assessment & Plan:  Acute hypoxemic respiratory failure Acute decompensated systolic heart failure (NIC[facility]) ESRD with fluid overload Symptomatic afib w/ HD compromise s/p DCCV 05/22/22 OSA on CPAP - Antiarrythmics per cardiology - BIPAP + urgent HD - Looks to have benefited from DCCV and should  turn around with BIPAP + HD.   If he cannot tolerate due to BP and needs CRRT or if he returns to afib/RVR, please reach back out and we can bring to ICU - IMTS to admit  Best Practice (right click and "Reselect all SmartList Selections" daily)    Per primary  Labs   CBC: Recent Labs  Lab 05/22/22 0732  WBC 12.2*  HGB 10.6*  HCT 30.8*  MCV 102.0*  PLT 518    Basic Metabolic Panel: Recent Labs  Lab 05/22/22 0732  NA 132*  K 4.5  CL 97*  CO2 18*  GLUCOSE 140*  BUN 51*  CREATININE 11.59*  CALCIUM 9.6   GFR: Estimated Creatinine Clearance: 14.6 mL/min (A) (by C-G formula based on SCr of 11.59 mg/dL (H)). Recent Labs  Lab 05/22/22 0732  WBC 12.2*    Liver Function Tests: No results for input(s): "AST", "ALT", "ALKPHOS", "BILITOT", "PROT", "ALBUMIN" in the last 168 hours. No results for input(s): "LIPASE", "AMYLASE" in the last 168 hours. No results for input(s): "AMMONIA" in the last 168 hours.  ABG    Component Value Date/Time   PHART 7.344 (L) 03/05/2020 1123   PCO2ART 73.1 (HH) 03/05/2020 1123   PO2ART 69.8 (L) 03/05/2020 1123   HCO3 25.0 03/26/2022 2042   TCO2 24 08/18/2021 1223   ACIDBASEDEF 2.0 08/18/2021 1223   O2SAT 82.4 03/26/2022 2042     Coagulation Profile: No results for input(s): "INR", "PROTIME" in the last 168 hours.  Cardiac Enzymes: No results for input(s): "CKTOTAL", "CKMB", "CKMBINDEX", "TROPONINI" in the last 168 hours.  HbA1C: Hgb A1c  MFr Bld  Date/Time Value Ref Range Status  03/26/2022 07:58 PM 5.3 4.8 - 5.6 % Final    Comment:    (NOTE) Pre diabetes:          5.7%-6.4%  Diabetes:              >6.4%  Glycemic control for   <7.0% adults with diabetes   08/19/2021 05:40 AM 5.9 (H) 4.8 - 5.6 % Final    Comment:    (NOTE)         Prediabetes: 5.7 - 6.4         Diabetes: >6.4         Glycemic control for adults with diabetes: <7.0     CBG: No results for input(s): "GLUCAP" in the last 168 hours.  Review of Systems:     Positive Symptoms in bold:  Constitutional fevers, chills, weight loss, fatigue, anorexia, malaise  Eyes decreased vision, double vision, eye irritation  Ears, Nose, Mouth, Throat sore throat, trouble swallowing, sinus congestion  Cardiovascular chest pain, paroxysmal nocturnal dyspnea, lower ext edema, palpitations   Respiratory SOB, cough, DOE, hemoptysis, wheezing  Gastrointestinal nausea, vomiting, diarrhea  Genitourinary burning with urination, trouble urinating  Musculoskeletal joint aches, joint swelling, back pain  Integumentary  rashes, skin lesions  Neurological focal weakness, focal numbness, trouble speaking, headaches  Psychiatric depression, anxiety, confusion  Endocrine polyuria, polydipsia, cold intolerance, heat intolerance  Hematologic abnormal bruising, abnormal bleeding, unexplained nose bleeds  Allergic/Immunologic recurrent infections, hives, swollen lymph nodes     Past Medical History:  He,  has a past medical history of Acute on chronic respiratory failure with hypoxia ([place]) (04/21/2021), Acute on chronic systolic (congestive) heart failure ([place]) (02/26/2020), Biventricular congestive heart failure ([place]), Class 3 severe obesity due to excess calories with serious comorbidity and body mass index (BMI) of 50.0 to 59.9 in adult [facility]) (02/26/2020), Essential hypertension (02/26/2020), GERD without esophagitis (02/26/2020), Hidradenitis suppurativa (02/26/2020), OSA (obstructive sleep apnea) (02/26/2020), Prediabetes (02/26/2020), and Renal disorder.   Surgical History:   Past Surgical History:  Procedure Laterality Date   ABSCESS DRAINAGE     AV FISTULA PLACEMENT Left 08/21/2021   Procedure: LEFT ARM ARTERIOVENOUS (AV) FISTULA.;  Surgeon: Serafina Mitchell, MD;  Location: [place];  Service: Vascular;  Laterality: Left;   CARDIOVERSION N/A 10/09/2021   Procedure: CARDIOVERSION;  Surgeon: Larey Dresser, MD;  Location: [facility] ENDOSCOPY;  Service: Cardiovascular;   Laterality: N/A;   IR FLUORO GUIDE CV LINE RIGHT  03/10/2021   IR FLUORO GUIDE CV LINE RIGHT  04/22/2021   IR FLUORO GUIDE CV LINE RIGHT  08/20/2021   IR US GUIDE VASC ACCESS RIGHT  03/10/2021   IR US GUIDE VASC ACCESS RIGHT  04/22/2021   RIGHT HEART CATH N/A 03/06/2021   Procedure: RIGHT HEART CATH;  Surgeon: Jolaine Artist, MD;  Location: [place] CV LAB;  Service: Cardiovascular;  Laterality: N/A;   RIGHT/LEFT HEART CATH AND CORONARY ANGIOGRAPHY N/A 03/04/2020   Procedure: RIGHT/LEFT HEART CATH AND CORONARY ANGIOGRAPHY;  Surgeon: Larey Dresser, MD;  Location: [place] [facility] LAB;  Service: Cardiovascular;  Laterality: N/A;   TEE WITHOUT CARDIOVERSION N/A 05/05/2021   Procedure: TRANSESOPHAGEAL ECHOCARDIOGRAM (TEE);  Surgeon: Larey Dresser, MD;  Location: [facility] ENDOSCOPY;  Service: Cardiovascular;  Laterality: N/A;   TEMPORARY DIALYSIS CATHETER  03/06/2021   Procedure: TEMPORARY DIALYSIS CATHETER;  Surgeon: Jolaine Artist, MD;  Location: [place] [facility];  Service: Cardiovascular;;     Social History:  reports that he quit smoking about 4 years ago. His smoking use included cigarettes. He smoked an average of 1 pack per day. He has been exposed to tobacco smoke. He has quit using smokeless tobacco. He reports that he does not drink alcohol and does not use drugs.   Family History:  His family history includes Drug abuse in his father; Heart disease in his mother; Hypertension in his mother; Pulmonary Hypertension in his mother.   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 Authorizing Provider  amiodarone (PACERONE) 200 MG tablet Take 2 tablets (400 mg total) TWICE daily for 2 weeks, then take 1 tablet (200 mg total) TWICE daily for 2  weeks, then take 1 tablet (200 mg total) ONCE daily 05/20/22   Camnitz, Ocie Doyne, MD  apixaban (ELIQUIS) 5 MG TABS tablet Take 1 tablet (5 mg total) by mouth 2 (two) times daily. 12/16/21   Larey Dresser, MD  diclofenac Sodium (VOLTAREN) 1 % GEL Apply 4 g topically 3 (three) times daily as needed (joint pains). 03/28/22   Lacinda Axon, MD     Critical care time: N/A

## 2022-05-22 NOTE — ED Triage Notes (Addendum)
Patient here w/ shortness of breath, generalized weakness, palpitations, nausea since Friday. Patient states that he went to his cardiologist on Thursday and he was started on a new medication (amiodarone) and started feeling bad the next day. Patient states he has had this issue with this medicine in the past and had to be taken off of it. Patient also a dialysis patient receives treatment Tues/Thurs/Sat. Has gone to scheduled treatments but missing todays due to being in the ED at this time.

## 2022-05-22 NOTE — ED Provider Notes (Signed)
[facility] Provider Note   CSN: [id number] Arrival date & time: 05/22/22  2482     History  Chief Complaint  Patient presents with   Shortness of Breath    Phillip Young is a 36 y.o. male with medical history of acute on chronic respiratory failure with hypoxia, OSA, GERD, hypertension, hidradenitis suppurativa, acute on chronic systolic heart failure, end-stage renal disease.  Patient presents to the ED for evaluation of shortness of breath, fatigue.  Patient reports that on Thursday he was seen at his cardiologist office and placed back on amiodarone.  The patient reports that he was taken off of amiodarone recently due to elevated LFTs.  The patient was noted to be tachycardic in his doctor's office and placed back on amiodarone.  Patient reports that he has been taking this ever since Thursday.  Patient reports ever since starting to take amiodarone he developed shortness of breath, lightheadedness, dizziness and generalized fatigue.  Patient reports that he has dialysis patient, goes on Tuesday, Thursday and Saturday.  Patient reports that he typically has 1 hour taken off of his dialysis time because in the past when he has gone for a full session he has had too much fluid drawn off.  Patient reports mild sore throat on Thursday prior to his doctor's office visit.  The patient denies any known sick contacts however.  Patient denies any fevers.   Shortness of Breath Associated symptoms: sore throat   Associated symptoms: no abdominal pain, no chest pain, no fever and no vomiting        Home Medications Prior to Admission medications   Medication Sig Start Date End Date Taking? Authorizing Provider  amiodarone (PACERONE) 200 MG tablet Take 2 tablets (400 mg total) TWICE daily for 2 weeks, then take 1 tablet (200 mg total) TWICE daily for 2 weeks, then take 1 tablet (200 mg total) ONCE daily 05/20/22  Yes Camnitz, Will Hassell Done, MD  apixaban  (ELIQUIS) 5 MG TABS tablet Take 1 tablet (5 mg total) by mouth 2 (two) times daily. 12/16/21  Yes Larey Dresser, MD  diclofenac Sodium (VOLTAREN) 1 % GEL Apply 4 g topically 3 (three) times daily as needed (joint pains). 03/28/22  Yes Lacinda Axon, MD  lanthanum (FOSRENOL) 1000 MG chewable tablet Chew 2,000 mg by mouth 3 (three) times daily with meals.   Yes [provider]  Menthol (HALLS COUGH DROPS) 5.8 MG LOZG Use as directed 1 lozenge in the mouth or throat every 4 (four) hours as needed (cough).   Yes [provider]      Allergies    Coreg [carvedilol] and Heparin    Review of Systems   Review of Systems  Constitutional:  Positive for fatigue. Negative for fever.  HENT:  Positive for sore throat.   Respiratory:  Positive for shortness of breath.   Cardiovascular:  Negative for chest pain and leg swelling.  Gastrointestinal:  Positive for nausea. Negative for abdominal pain and vomiting.  Neurological:  Positive for dizziness and light-headedness. Negative for syncope.  All other systems reviewed and are negative.   Physical Exam Updated Vital Signs BP 101/66   Pulse 95   Temp 97.8 F (36.6 C) (Oral)   Resp (!) 26   SpO2 99%  Physical Exam Vitals and nursing note reviewed.  Constitutional:      General: He is not in acute distress.    Appearance: Normal appearance. He is obese. He is not ill-appearing,  toxic-appearing or diaphoretic.  HENT:     Head: Normocephalic and atraumatic.     Nose: Nose normal. No congestion.     Mouth/Throat:     Mouth: Mucous membranes are moist.     Pharynx: Oropharynx is clear.  Eyes:     Extraocular Movements: Extraocular movements intact.     Conjunctiva/sclera: Conjunctivae normal.     Pupils: Pupils are equal, round, and reactive to light.  Cardiovascular:     Rate and Rhythm: Regular rhythm. Tachycardia present.  Abdominal:     General: Abdomen is flat. Bowel sounds are normal.     Palpations: Abdomen is  soft.     Tenderness: There is no abdominal tenderness.  Musculoskeletal:     Cervical back: Normal range of motion and neck supple.  Skin:    General: Skin is warm and dry.     Capillary Refill: Capillary refill takes less than 2 seconds.  Neurological:     General: No focal deficit present.     Mental Status: He is alert and oriented to person, place, and time.     GCS: GCS eye subscore is 4. GCS verbal subscore is 5. GCS motor subscore is 6.     Cranial Nerves: Cranial nerves 2-12 are intact. No cranial nerve deficit.     Sensory: Sensation is intact. No sensory deficit.     Motor: Motor function is intact. No weakness.     Coordination: Coordination is intact. Heel to Shin Test normal.     ED Results / Procedures / Treatments   Labs (all labs ordered are listed, but only abnormal results are displayed) Labs Reviewed  BASIC METABOLIC PANEL - Abnormal; Notable for the following components:      Result Value   Sodium 132 (*)    Chloride 97 (*)    CO2 18 (*)    Glucose, Bld 140 (*)    BUN 51 (*)    Creatinine, Ser 11.59 (*)    GFR, Estimated 5 (*)    Anion gap 17 (*)    All other components within normal limits  CBC - Abnormal; Notable for the following components:   WBC 12.2 (*)    RBC 3.02 (*)    Hemoglobin 10.6 (*)    HCT 30.8 (*)    MCV 102.0 (*)    MCH 35.1 (*)    All other components within normal limits  BRAIN NATRIURETIC PEPTIDE - Abnormal; Notable for the following components:   B Natriuretic Peptide 373.1 (*)    All other components within normal limits  TROPONIN I (HIGH SENSITIVITY) - Abnormal; Notable for the following components:   Troponin I (High Sensitivity) 36 (*)    All other components within normal limits  TROPONIN I (HIGH SENSITIVITY) - Abnormal; Notable for the following components:   Troponin I (High Sensitivity) 38 (*)    All other components within normal limits  RESP PANEL BY RT-PCR (FLU A&B, COVID) ARPGX2  HEPATITIS B SURFACE ANTIGEN   HEPATITIS B SURFACE ANTIBODY, QUANTITATIVE    EKG EKG Interpretation  Date/Time:  Saturday May 22 2022 07:16:49 EDT Ventricular Rate:  113 PR Interval:    QRS Duration: 94 QT Interval:  310 QTC Calculation: 425 R Axis:   77 Text Interpretation: Atrial flutter with variable A-V block ST depression, consider subendocardial injury Nonspecific T wave abnormality Abnormal ECG Otherwise no significant change Confirmed by Deno Etienne [phone]) on 05/22/2022 8:40:30 AM  Radiology CT Angio Chest PE W and/or Wo  Contrast  Result Date: 05/22/2022 CLINICAL DATA:  Chest pain and shortness of breath. Evaluate for pulmonary embolism. EXAM: CT ANGIOGRAPHY CHEST WITH CONTRAST TECHNIQUE: Multidetector CT imaging of the chest was performed using the standard protocol during bolus administration of intravenous contrast. Multiplanar CT image reconstructions and MIPs were obtained to evaluate the vascular anatomy. RADIATION DOSE REDUCTION: This exam was performed according to the departmental dose-optimization program which includes automated exposure control, adjustment of the mA and/or kV according to patient size and/or use of iterative reconstruction technique. CONTRAST:  87mL OMNIPAQUE IOHEXOL 350 MG/ML SOLN COMPARISON:  August 18, 2021 CT of the chest. Chest x-ray May 22, 2022. FINDINGS: Cardiovascular: The thoracic aorta is normal in caliber. No aneurysm or dissection identified. No atherosclerotic change. Cardiomegaly is stable. No pulmonary artery dilatation identified. The attenuation of the opacified pulmonary artery is 245 Hounsfield units demonstrating a moderate quality study. The main and lobar pulmonary arteries are well opacified. Evaluation at the level of the segmental pulmonary arteries and distal is limited due to artifact. Within this limitation, no pulmonary emboli are identified. Mediastinum/Nodes: Mediastinal adenopathy remains a right paratracheal node on series 8, image 44 measures 2  cm in diameter today, stable. An AP window node on series 8, image 48 measures 2.4 cm today versus 2.2 cm on the August 18, 2021 study and 2.2 cm on the October 09, 2020 study. Bilateral hilar adenopathy is similar in the interval. Overall, the adenopathy is stable to slightly more prominent in the interval. The thyroid and esophagus are normal. No pleural effusions or pericardial effusions noted. Bilateral gynecomastia is identified. No other chest wall abnormalities are noted. Lungs/Pleura: Evaluation of the lungs is limited due to patient body habitus. Diffuse ground-glass opacity is identified in the lungs with a few regions of decreased attenuation, likely air trapping. A calcified nodules identified in the left lung base, of no significance. No suspicious pulmonary nodules or masses are identified. Upper Abdomen: No acute abnormality. Musculoskeletal: No chest wall abnormality. No acute or significant osseous findings. Review of the MIP images confirms the above findings. IMPRESSION: 1. No pulmonary emboli identified in the main or lobar pulmonary arteries. Evaluation at and beyond the segmental level is limited. 2. Cardiomegaly. 3. Diffuse ground-glass throughout the lungs suggest pulmonary venous congestion. Regions of decreased attenuation suggests air trapping as well. Recommend clinical correlation. 4. Adenopathy in the mediastinum and hila is stable to mildly more prominent the interval. This finding is nonspecific but has been present since February 19, 2020. Electronically Signed   By: Dorise Bullion III M.D.   On: 05/22/2022 11:07   DG Chest 2 View  Result Date: 05/22/2022 CLINICAL DATA:  36 year old male with chest pain and shortness of breath. EXAM: CHEST - 2 VIEW COMPARISON:  Chest radiographs 03/26/2022 and earlier. FINDINGS: PA and lateral views at 0818 hours. Stable cardiomegaly and mediastinal contours. Stable right chest dual lumen dialysis type catheter. Visualized tracheal air column is  within normal limits. Trace fluid now in the pleural fissures appears new since September. But pulmonary vascularity appears stable. No dependent pleural effusion. No consolidation or confluent opacity. No acute osseous abnormality identified. Negative visible bowel gas. IMPRESSION: Chronic cardiomegaly with trace new fluid in the pleural fissures. Stable pulmonary vascularity but still, consider mild or developing interstitial edema. Electronically Signed   By: Genevie Ann M.D.   On: 05/22/2022 08:42    Procedures .Critical Care  Performed by: Azucena Cecil, PA-C Authorized by: Azucena Cecil, PA-C  Critical care provider statement:    Critical care time (minutes):  85   Critical care time was exclusive of:  Separately billable procedures and treating other patients and teaching time   Critical care was necessary to treat or prevent imminent or life-threatening deterioration of the following conditions:  Circulatory failure, metabolic crisis, respiratory failure and cardiac failure   Critical care was time spent personally by me on the following activities:  Blood draw for specimens, development of treatment plan with patient or surrogate, discussions with consultants, discussions with primary provider, evaluation of patient's response to treatment, examination of patient, interpretation of cardiac output measurements, obtaining history from patient or surrogate, vascular access procedures, review of old charts, re-evaluation of patient's condition, pulse oximetry, ordering and review of radiographic studies, ordering and review of laboratory studies and ordering and performing treatments and interventions   Care discussed with: admitting provider      Medications Ordered in ED Medications  midodrine (PROAMATINE) tablet 10 mg (10 mg Oral Given 05/22/22 1151)  etomidate (AMIDATE) injection 15 mg (15 mg Intravenous Not Given 05/22/22 1252)  ondansetron (ZOFRAN) injection 4 mg (4 mg  Intravenous Given 05/22/22 0935)  iohexol (OMNIPAQUE) 350 MG/ML injection 80 mL (80 mLs Intravenous Contrast Given 05/22/22 1024)  etomidate (AMIDATE) injection (15 mg Intravenous Given 05/22/22 1231)  ondansetron (ZOFRAN) injection 4 mg (4 mg Intravenous Given 05/22/22 1304)    ED Course/ Medical Decision Making/ A&P                           Medical Decision Making Amount and/or Complexity of Data Reviewed Labs: ordered. Radiology: ordered.   51-year male presents to the ED for evaluation.  Please see HPI for further details.  On my examination the patient is tachycardic to 130, nonfebrile.  The patient is hypoxic on room air.  The patient was placed on 5 L of oxygen at this time which has improved him.  The patient abdomen is soft and compressible throughout.  Patient neurological examination shows no focal neurodeficits.  Patient has no appearance of volume overload.  Due to patient complaint we will proceed with following imaging studies and lab work to include BNP, troponin, respiratory panel, BMP, CBC, chest x-ray, EKG.  Patient troponin 36 initially, delta troponin of 38.  The patient denies chest pain.  The patient BNP is elevated at 373.1 however this is the patient baseline BNP it appears on chart review.  The patient BMP is decreased sodium to 132, elevated creatinine of 1.59, elevated anion gap of 17.  The patient creatinine is at baseline.  Unknown cause of metabolic acidosis at this time, patient sugar is 140.  Patient CBC with elevated white blood cell count of 12.2.  The patient was noted to have MSSA bacteremia as well as left iliac is abscess on his last hospitalization.  Patient respiratory panel negative for all.  The patient chest x-ray shows chronic cardiomegaly with trace new fluid in the pleural fissures.  Patient EKG shows a flutter.  Due to patient tachycardia, new onset hypoxia the patient was scanned with CT angio to assess for PE.  There is no evidence of PE on CT  angio however the patient did have pulmonary venous congestion.  At this time, nephrology was consulted due to the possibility that this patient was volume overloaded.  The patient denies feeling as if he is volume overloaded however nephrology has agreed to dialyze the patient during his hospital  stay.  The patient during work-up became hypotensive prompting me to consult with cardiology.  Cardiologist came down and together we cardioverted this patient successfully.  The patient complained of nausea after cardioversion most likely due to etomidate, Zofran was provided.  Critical care came down, evaluate the patient and stated that they would consult on him during his hospitalization here.  They have advised [place] to place the patient on BiPAP we have done.  At this time, the patient will be admitted to the internal medicine teaching service for further management.  Final Clinical Impression(s) / ED Diagnoses Final diagnoses:  Acute on chronic respiratory failure with hypoxia ([name])  Typical atrial flutter [facility])    Rx / DC Orders ED Discharge Orders     None         Azucena Cecil, PA-C 05/22/22 [place], DO 05/23/22 [id number]

## 2022-05-22 NOTE — Consult Note (Signed)
Cardiology  Consult:   Patient ID: Phillip Young; MRN: [id number]; DOB: 07/04/1986   Admission date: 05/22/2022  Primary Care Provider: [name], MD Primary Cardiologist: Dr. Marigene Ehlers Primary Electrophysiologist:  Dr. Curt Bears  Chief Complaint:  Hypotension  Patient Profile:   Phillip Young is a 36 y.o. male with a history of HFrEF, Super morbid obesity, and ESRD.  Planned for HD today with hypoxic respiratory failure (chief complaint). Has developed AFL RVR and associated hypotension planned for urgent cardioversion.  History of Present Illness:   Phillip Young is feeling poorly.    He notes that he was doing well, save for an EF visit for missed HD he has beend feeling well.  His LVEF is severely reduced, he was having worsening SOB and saw EP.  Started on amiodarone and planned for DCCV.  HE is not of amiodarone in the past because of LFT elevation.  Since starting amiodarone 2 days ago has felet worsening SOB.  HE attributed this to the amiodarone but went for ED evaluation. He is on eliquis with no missed doses. He is on PO amiodarone. He is no longer on midodrine- needed in the past for hypotension.  Has weight gain, SOB, DOE, and fatigue. No CP No palpitations but feels worse in AFL.  As part of eval planned for HD- he has since had increase in AFL rate and worsening BP.  Sister is at bedside.  Past Medical History:  Diagnosis Date   Acute on chronic respiratory failure with hypoxia ([place]) 04/21/2021   Acute on chronic systolic (congestive) heart failure ([name]) 02/26/2020   Biventricular congestive heart failure ([place])    Last Echo 11/2019 at [facility] reveals EF 20%   Class 3 severe obesity due to excess calories with serious comorbidity and body mass index (BMI) of 50.0 to 59.9 in adult [facility]) 02/26/2020   Essential hypertension 02/26/2020   GERD without esophagitis 02/26/2020   Hidradenitis suppurativa 02/26/2020   OSA (obstructive sleep apnea) 02/26/2020    Prediabetes 02/26/2020   Renal disorder     Past Surgical History:  Procedure Laterality Date   ABSCESS DRAINAGE     AV FISTULA PLACEMENT Left 08/21/2021   Procedure: LEFT ARM ARTERIOVENOUS (AV) FISTULA.;  Surgeon: Serafina Mitchell, MD;  Location: [place];  Service: Vascular;  Laterality: Left;   CARDIOVERSION N/A 10/09/2021   Procedure: CARDIOVERSION;  Surgeon: Larey Dresser, MD;  Location: [place];  Service: Cardiovascular;  Laterality: N/A;   IR FLUORO GUIDE CV LINE RIGHT  03/10/2021   IR FLUORO GUIDE CV LINE RIGHT  04/22/2021   IR FLUORO GUIDE CV LINE RIGHT  08/20/2021   IR US GUIDE VASC ACCESS RIGHT  03/10/2021   IR US GUIDE VASC ACCESS RIGHT  04/22/2021   RIGHT HEART CATH N/A 03/06/2021   Procedure: RIGHT HEART CATH;  Surgeon: Jolaine Artist, MD;  Location: [place] CV LAB;  Service: Cardiovascular;  Laterality: N/A;   RIGHT/LEFT HEART CATH AND CORONARY ANGIOGRAPHY N/A 03/04/2020   Procedure: RIGHT/LEFT HEART CATH AND CORONARY ANGIOGRAPHY;  Surgeon: Larey Dresser, MD;  Location: [place] CV LAB;  Service: Cardiovascular;  Laterality: N/A;   TEE WITHOUT CARDIOVERSION N/A 05/05/2021   Procedure: TRANSESOPHAGEAL ECHOCARDIOGRAM (TEE);  Surgeon: Larey Dresser, MD;  Location: [facility] ENDOSCOPY;  Service: Cardiovascular;  Laterality: N/A;   TEMPORARY DIALYSIS CATHETER  03/06/2021   Procedure: TEMPORARY DIALYSIS CATHETER;  Surgeon: Jolaine Artist, MD;  Location: [place] CV LAB;  Service: Cardiovascular;;  Medications Prior to Admission: Prior to Admission medications   Medication Sig Start Date End Date Taking? Authorizing Provider  amiodarone (PACERONE) 200 MG tablet Take 2 tablets (400 mg total) TWICE daily for 2 weeks, then take 1 tablet (200 mg total) TWICE daily for 2 weeks, then take 1 tablet (200 mg total) ONCE daily 05/20/22   Camnitz, Ocie Doyne, MD  apixaban (ELIQUIS) 5 MG TABS tablet Take 1 tablet (5 mg total) by mouth 2 (two) times daily. 12/16/21    Larey Dresser, MD  diclofenac Sodium (VOLTAREN) 1 % GEL Apply 4 g topically 3 (three) times daily as needed (joint pains). 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, SRA positive    Social History:   Social History   Socioeconomic History   Marital status: Single    Spouse name: Not on file   Number of children: Not on file   Years of education: Not on file   Highest education level: Not on file  Occupational History   Not on file  Tobacco Use   Smoking status: Former    Packs/day: 1.00    Types: Cigarettes    Quit date: 2019    Years since quitting: 4.8    Passive exposure: Current   Smokeless tobacco: Former   Tobacco comments:    quit in 2019  Vaping Use   Vaping Use: Never used  Substance and Sexual Activity   Alcohol use: Never   Drug use: Never   Sexual activity: Not on file  Other Topics Concern   Not on file  Social History Narrative   Not on file   Social Determinants of Health   Financial Resource Strain: Low Risk  (04/13/2021)   Overall Financial Resource Strain (CARDIA)    Difficulty of Paying Living Expenses: Not very hard  Food Insecurity: Food Insecurity Present (04/13/2021)   Hunger Vital Sign    Worried About [place] in the Last Year: Never true    Ran Out of Food in the Last Year: Sometimes true  Transportation Needs: No Transportation Needs (03/30/2022)   PRAPARE - Hydrologist (Medical): No    Lack of Transportation (Non-Medical): No  Physical Activity: Not on file  Stress: Not on file  Social Connections: Not on file  Intimate Partner Violence: Not on file    Family History:   The patient's family history includes Drug abuse in his father; Heart disease in his mother; Hypertension in his mother; Pulmonary Hypertension in his  mother.    ROS:  Please see the history of present illness.  All other ROS reviewed and negative.     Physical Exam/Data:   Vitals:   05/22/22 0915 05/22/22 0940 05/22/22 1035 05/22/22 1123  BP: 113/88 119/79 119/79 91/75  Pulse: (!) 111 (!) 118 (!) 110 (!) 110  Resp: 17 (!) 22 (!) 22 20  Temp:      TempSrc:      SpO2: 100% 100% 94% 93%   No intake or output data in the 24 hours ending 05/22/22 1202 There were no vitals filed for this visit. There is no height or weight on file to calculate BMI.   Gen: mild distress, super morbid obesity   Neck: thick neck  Cardiac: No Rubs or Gallops, IRIR tachycardia no murmur but distant heart sounds Respiratory: Decreased breath sounds in bases normal effort, increased  respiratory rate GI: Soft, nontender, non-distended  Integument: Skin feels warm Neuro:  At time of evaluation, alert and oriented to person/place/time/situation  Psych: Normal affect, patient feels poorly  EKG:  The ECG that was done  was personally reviewed and demonstrates AF RVR Tele: AFL RVR  Relevant CV Studies:  Cardiac Studies & Procedures   CARDIAC CATHETERIZATION  CARDIAC CATHETERIZATION 03/06/2021  Narrative Findings:   On DBA 7.5 and NE 5  RA = 17 RV = 53/19 PA = 57/18 (36) PCW = 23 Fick cardiac output/index = 6.0/2.1 Thermo CO/CI = 8.9/3.0 PVR = 2.2 FA sat = 98% PA sat = 52%, 54% PaPI = 2.3 RA/PCW = 0.74  Assessment: 1. Biventricular failure with elevated pressures R>L 2. Preserved CO with pressor support 3. Successful LIJ trialysis catheter placement  Plan/Discussion:  Continue iHD. Wean inotropes as tolerated.  Glori Bickers, MD 2:33 PM   CARDIAC CATHETERIZATION  CARDIAC CATHETERIZATION 03/04/2020  Narrative 1. No significant CAD, nonischemic cardiomyopathy. 2. Filling pressures remain elevated. 3. Severe pulmonary venous hypertension. 4. Low but not markedly low cardiac output.  Findings Coronary Findings Diagnostic   Dominance: Right  Left Main No significant disease.  Left Anterior Descending Luminal irregularities.  Ramus Intermedius Large, no significant disease.  Left Circumflex Small, no significant disease.  Right Coronary Artery No significant disease.  Intervention  No interventions have been documented.     ECHOCARDIOGRAM  ECHOCARDIOGRAM COMPLETE 03/24/2022  Narrative ECHOCARDIOGRAM REPORT    Patient Name:   TEREK BEE Date of Exam: 03/24/2022 Medical Rec #:  [id number]      Height:       72.0 in Accession #:    [id number]     Weight:       406.4 lb Date of Birth:  02/13/1986       BSA:          2.880 m Patient Age:    56 years       BP:           140/90 mmHg Patient Gender: M              HR:           92 bpm. Exam Location:  Outpatient  Procedure: 2D Echo, Cardiac Doppler and Color Doppler  Indications:    I50.22 CHF  History:        Patient has prior history of Echocardiogram examinations, most recent 05/05/2021. CHF, Abnormal ECG, Arrythmias:Atrial Fibrillation; Risk Factors:Hypertension, Family History of Coronary Artery Disease, Sleep Apnea and Former Smoker. End Stage Renal Disease with Dialysis, Morbid Obesity.  Sonographer:    Deliah Boston RDCS Referring Phys: [place] Comments: No Definity Contrast Per Patient Request IMPRESSIONS   1. Left ventricular ejection fraction, by estimation, is 20 to 25%. The left ventricle has severely decreased function. The left ventricle demonstrates global hypokinesis. The left ventricular internal cavity size was moderately dilated. Left ventricular diastolic parameters were normal. 2. Right ventricular systolic function is normal. The right ventricular size is mildly enlarged. There is severely elevated pulmonary artery systolic pressure. The estimated right ventricular systolic pressure is 35.4 mmHg. 3. Left atrial size was mildly dilated. 4. Right atrial size was mildly dilated. 5.  The mitral valve is normal in structure. Mild to moderate mitral valve regurgitation. No evidence of mitral stenosis. 6. The  aortic valve is normal in structure. Aortic valve regurgitation is not visualized. No aortic stenosis is present. 7. The inferior vena cava is normal in size with greater than 50% respiratory variability, suggesting right atrial pressure of 3 mmHg.  Comparison(s): No significant change from prior study. Prior images reviewed side by side.  FINDINGS Left Ventricle: Left ventricular ejection fraction, by estimation, is 20 to 25%. The left ventricle has severely decreased function. The left ventricle demonstrates global hypokinesis. The left ventricular internal cavity size was moderately dilated. There is no left ventricular hypertrophy. Left ventricular diastolic parameters were normal.  Right Ventricle: The right ventricular size is mildly enlarged. No increase in right ventricular wall thickness. Right ventricular systolic function is normal. There is severely elevated pulmonary artery systolic pressure. The tricuspid regurgitant velocity is 4.11 m/s, and with an assumed right atrial pressure of 3 mmHg, the estimated right ventricular systolic pressure is 52.8 mmHg.  Left Atrium: Left atrial size was mildly dilated.  Right Atrium: Right atrial size was mildly dilated.  Pericardium: There is no evidence of pericardial effusion.  Mitral Valve: The mitral valve is normal in structure. Mild to moderate mitral valve regurgitation, with posteriorly-directed jet. No evidence of mitral valve stenosis.  Tricuspid Valve: The tricuspid valve is normal in structure. Tricuspid valve regurgitation is trivial. No evidence of tricuspid stenosis.  Aortic Valve: The aortic valve is normal in structure. Aortic valve regurgitation is not visualized. No aortic stenosis is present.  Pulmonic Valve: The pulmonic valve was normal in structure. Pulmonic valve regurgitation is not visualized.  No evidence of pulmonic stenosis.  Aorta: The aortic root is normal in size and structure.  Venous: The inferior vena cava is normal in size with greater than 50% respiratory variability, suggesting right atrial pressure of 3 mmHg.  IAS/Shunts: No atrial level shunt detected by color flow Doppler.   LEFT VENTRICLE PLAX 2D LVIDd:         7.10 cm   Diastology LVIDs:         6.00 cm   LV e' medial:    9.14 cm/s LV PW:         1.20 cm   LV E/e' medial:  10.1 LV IVS:        0.90 cm   LV e' lateral:   9.68 cm/s LVOT diam:     2.50 cm   LV E/e' lateral: 9.5 LV SV:         70 LV SV Index:   24 LVOT Area:     4.91 cm   RIGHT VENTRICLE RV Basal diam:  5.20 cm RV Mid diam:    4.50 cm RV S prime:     11.00 cm/s TAPSE (M-mode): 1.4 cm  LEFT ATRIUM              Index        RIGHT ATRIUM           Index LA diam:        4.80 cm  1.67 cm/m   RA Area:     24.00 cm LA Vol (A2C):   99.1 ml  34.41 ml/m  RA Volume:   81.50 ml  28.30 ml/m LA Vol (A4C):   110.0 ml 38.20 ml/m LA Biplane Vol: 108.0 ml 37.50 ml/m AORTIC VALVE LVOT Vmax:   82.27 cm/s LVOT Vmean:  54.067 cm/s LVOT VTI:    0.143 m  AORTA Ao Root diam: 3.50 cm Ao Asc diam:  2.70 cm  MITRAL  VALVE               TRICUSPID VALVE MV Area (PHT): cm         TR Peak grad:   67.6 mmHg MV Decel Time: 167 msec    TR Vmax:        411.00 cm/s MV E velocity: 92.05 cm/s MV A velocity: 43.40 cm/s  SHUNTS MV E/A ratio:  2.12        Systemic VTI:  0.14 m Systemic Diam: 2.50 cm  Candee Furbish MD Electronically signed by Candee Furbish MD Signature Date/Time: 03/24/2022/4:02:00 PM    Final   TEE  ECHO TEE 06/17/2021  Narrative TRANSESOPHOGEAL ECHO REPORT    Patient Name:   BRAETON WOLGAMOTT Date of Exam: 05/05/2021 Medical Rec #:  [id number]      Height:       71.0 in Accession #:    [id number]     Weight:       485.0 lb Date of Birth:  [birth year]       BSA:          3.072 m Patient Age:    47 years       BP:           136/80  mmHg Patient Gender: M              HR:           90 bpm. Exam Location:  Inpatient  Procedure: 2D Echo, Cardiac Doppler and Color Doppler  Indications:     Bacteremia  History:         Patient has prior history of Echocardiogram examinations, most recent 04/18/2021.  Sonographer:     Merrie Roof RDCS Referring Phys:  [place] Diagnosing Phys: Franki Monte  PROCEDURE: The transesophogeal probe was passed without difficulty through the esophogus of the patient. Sedation performed by different physician. The patient was monitored while under deep sedation. The patient's vital signs; including heart rate, blood pressure, and oxygen saturation; remained stable throughout the procedure. The patient developed no complications during the procedure.  IMPRESSIONS   1. Left ventricular ejection fraction, by estimation, is 20%. The left ventricle has severely decreased function. The left ventricle demonstrates global hypokinesis. The left ventricular internal cavity size was moderately dilated. There is mild left ventricular hypertrophy. 2. Right ventricular systolic function is moderately reduced. The right ventricular size is mildly enlarged. 3. Left atrial size was moderately dilated. No left atrial/left atrial appendage thrombus was detected. 4. No PFO/ASD by color doppler. 5. HD catheter in SVC, no vegetation noted. 6. Mild TR, peak RV-RA gradient 43 mmHg. No TV vegetation. 7. No PV vegetation. 8. No AoV vegetation. The aortic valve is tricuspid. Aortic valve regurgitation is not visualized. No aortic stenosis is present. 9. No MV vegetation. The mitral valve is normal in structure. Mild mitral valve regurgitation. No evidence of mitral stenosis. 10. No evidence for endocarditis.  FINDINGS Left Ventricle: Left ventricular ejection fraction, by estimation, is 20%. The left ventricle has severely decreased function. The left ventricle demonstrates global hypokinesis. The  left ventricular internal cavity size was moderately dilated. There is mild left ventricular hypertrophy.  Right Ventricle: The right ventricular size is mildly enlarged. No increase in right ventricular wall thickness. Right ventricular systolic function is moderately reduced.  Left Atrium: Left atrial size was moderately dilated. No left atrial/left atrial appendage thrombus was detected.  Right Atrium: Right atrial size was normal in size. HD catheter  in SVC, no vegetation noted.  Pericardium: There is no evidence of pericardial effusion.  Mitral Valve: No MV vegetation. The mitral valve is normal in structure. Mild mitral valve regurgitation. No evidence of mitral valve stenosis.  Tricuspid Valve: Mild TR, peak RV-RA gradient 43 mmHg. No TV vegetation. The tricuspid valve is normal in structure. Tricuspid valve regurgitation is mild.  Aortic Valve: No AoV vegetation. The aortic valve is tricuspid. Aortic valve regurgitation is not visualized. No aortic stenosis is present.  Pulmonic Valve: No PV vegetation. The pulmonic valve was normal in structure. Pulmonic valve regurgitation is not visualized.  Aorta: The aortic root is normal in size and structure.  IAS/Shunts: No PFO/ASD by color doppler.  Additional Comments: No evidence for endocarditis.  Dalton McleanMD Electronically signed by Franki Monte Signature Date/Time: 06/17/2021/1:10:36 PM    Final             Laboratory Data:  Chemistry Recent Labs  Lab 05/22/22 0732  NA 132*  K 4.5  CL 97*  CO2 18*  GLUCOSE 140*  BUN 51*  CREATININE 11.59*  CALCIUM 9.6  GFRNONAA 5*  ANIONGAP 17*    No results for input(s): "PROT", "ALBUMIN", "AST", "ALT", "ALKPHOS", "BILITOT" in the last 168 hours. Hematology Recent Labs  Lab 05/22/22 0732  WBC 12.2*  RBC 3.02*  HGB 10.6*  HCT 30.8*  MCV 102.0*  MCH 35.1*  MCHC 34.4  RDW 14.6  PLT 263   Cardiac EnzymesNo results for input(s): "TROPONINI" in the last 168  hours. No results for input(s): "TROPIPOC" in the last 168 hours.  BNP Recent Labs  Lab 05/22/22 0732  BNP 373.1*    DDimer No results for input(s): "DDIMER" in the last 168 hours.  Radiology/Studies:  CT Angio Chest PE W and/or Wo Contrast  Result Date: 05/22/2022 CLINICAL DATA:  Chest pain and shortness of breath. Evaluate for pulmonary embolism. EXAM: CT ANGIOGRAPHY CHEST WITH CONTRAST TECHNIQUE: Multidetector CT imaging of the chest was performed using the standard protocol during bolus administration of intravenous contrast. Multiplanar CT image reconstructions and MIPs were obtained to evaluate the vascular anatomy. RADIATION DOSE REDUCTION: This exam was performed according to the departmental dose-optimization program which includes automated exposure control, adjustment of the mA and/or kV according to patient size and/or use of iterative reconstruction technique. CONTRAST:  24mL OMNIPAQUE IOHEXOL 350 MG/ML SOLN COMPARISON:  August 18, 2021 CT of the chest. Chest x-ray May 22, 2022. FINDINGS: Cardiovascular: The thoracic aorta is normal in caliber. No aneurysm or dissection identified. No atherosclerotic change. Cardiomegaly is stable. No pulmonary artery dilatation identified. The attenuation of the opacified pulmonary artery is 245 Hounsfield units demonstrating a moderate quality study. The main and lobar pulmonary arteries are well opacified. Evaluation at the level of the segmental pulmonary arteries and distal is limited due to artifact. Within this limitation, no pulmonary emboli are identified. Mediastinum/Nodes: Mediastinal adenopathy remains a right paratracheal node on series 8, image 44 measures 2 cm in diameter today, stable. An AP window node on series 8, image 48 measures 2.4 cm today versus 2.2 cm on the August 18, 2021 study and 2.2 cm on the October 09, 2020 study. Bilateral hilar adenopathy is similar in the interval. Overall, the adenopathy is stable to slightly more  prominent in the interval. The thyroid and esophagus are normal. No pleural effusions or pericardial effusions noted. Bilateral gynecomastia is identified. No other chest wall abnormalities are noted. Lungs/Pleura: Evaluation of the lungs is limited due to patient body  habitus. Diffuse ground-glass opacity is identified in the lungs with a few regions of decreased attenuation, likely air trapping. A calcified nodules identified in the left lung base, of no significance. No suspicious pulmonary nodules or masses are identified. Upper Abdomen: No acute abnormality. Musculoskeletal: No chest wall abnormality. No acute or significant osseous findings. Review of the MIP images confirms the above findings. IMPRESSION: 1. No pulmonary emboli identified in the main or lobar pulmonary arteries. Evaluation at and beyond the segmental level is limited. 2. Cardiomegaly. 3. Diffuse ground-glass throughout the lungs suggest pulmonary venous congestion. Regions of decreased attenuation suggests air trapping as well. Recommend clinical correlation. 4. Adenopathy in the mediastinum and hila is stable to mildly more prominent the interval. This finding is nonspecific but has been present since February 19, 2020. Electronically Signed   By: Dorise Bullion III M.D.   On: 05/22/2022 11:07   DG Chest 2 View  Result Date: 05/22/2022 CLINICAL DATA:  36 year old male with chest pain and shortness of breath. EXAM: CHEST - 2 VIEW COMPARISON:  Chest radiographs 03/26/2022 and earlier. FINDINGS: PA and lateral views at 0818 hours. Stable cardiomegaly and mediastinal contours. Stable right chest dual lumen dialysis type catheter. Visualized tracheal air column is within normal limits. Trace fluid now in the pleural fissures appears new since September. But pulmonary vascularity appears stable. No dependent pleural effusion. No consolidation or confluent opacity. No acute osseous abnormality identified. Negative visible bowel gas. IMPRESSION:  Chronic cardiomegaly with trace new fluid in the pleural fissures. Stable pulmonary vascularity but still, consider mild or developing interstitial edema. Electronically Signed   By: Genevie Ann M.D.   On: 05/22/2022 08:42    Assessment and Plan:   Hypotension AFL RVR HFrEF Super Morbid Obesity Anemia with no bleeding diathesis - We discussed DCCV give hypotension neo and levo at bedside - Discussed with patient and sister; consented for DCCV; sedation - Keep Amiodarone - Re add midodrine - No GDMT because of PB   [facility] has been requested to perform a cardioversion.  After careful review of history and examination, the risks and benefits  Alternatives to treatment were discussed, questions were answered. Patient is willing to proceed.  ESRD -HD if stable; other wise CVVHD  Needs at least a 2C bed; if his BP does not come back up post sedation then to the unit   CRITICAL CARE Performed by: Aziah Kaiser A Tylerjames Hoglund  Total critical care time: 60 minutes. Critical care time was exclusive of separately billable procedures and treating other patients. Critical care was necessary to treat or prevent imminent or life-threatening deterioration. Critical care was time spent personally by me on the following activities: development of treatment plan with patient and/or surrogate as well as nursing, discussions with consultants, evaluation of patient's response to treatment, examination of patient, obtaining history from patient or surrogate, ordering and performing treatments and interventions, ordering and review of laboratory studies, ordering and review of radiographic studies, pulse oximetry and re-evaluation of patient's condition.    Signed, Rudean Haskell, MD [place]  05/22/2022 12:48 PM    For questions or updates, please contact [place] Please consult [email or web address] for contact info under Cardiology/STEMI.   Rudean Haskell, MD  [address] [place], [place] [address] [phone]  12:02 PM

## 2022-05-22 NOTE — ED Notes (Signed)
Patient given food and soda per MD request, patient taken to dialysis. Report given to dialysis nurse.

## 2022-05-22 NOTE — ED Notes (Addendum)
Patient took off bipap stating "I don't need it while I am sitting up, only while laying down."  SPO @ 88%. Patient refused to put bipap on at this time. MD paged. Oxygen place on via [place].

## 2022-05-22 NOTE — Progress Notes (Signed)
Patient placed on BIPAP in HD unit, currently tolerating well all vitals are stable.

## 2022-05-22 NOTE — H&P (Cosign Needed Addendum)
Date: 05/22/2022               Patient Name:  Phillip Young MRN: [id number]  DOB: 1985/08/03 Age / Sex: 36 y.o., male   PCP: [name], MD         Medical Service: Internal Medicine Teaching Service         Attending Physician: Dr. Velna Ochs, MD    First Contact: Dr. Teola Bradley, MD Pager: [phone]  Second Contact: Dr. Staci Righter Pager: [phone]       After Hours (After 5p/  First Contact Pager: [phone]  weekends / holidays): Second Contact Pager: [phone]   Chief Complaint: SOB, palpitations, lightheadedness and fatigue.  History of Present Illness: Phillip Young is a 36 yo male with medical history of acute on chronic respiratory failure with hypoxia, HFrEF, OSA on BIPAP, ESRD on HD T Th Sat who presented to the ED with shortness of breath, lightheadedness, and palpitations. The symptoms have been rapidly worsening over the last 2 days.Pt reports he saw cardiologist (EP) on Thursday and were tachycardic and were started on Amiodarone. Pt were never on amiodarone due to elevated LFT's. He started to experience palpitations, SHOB, lightheadedness since he started amiodarone.The patient states that he couldn't take more than 10 steps without feeling short of breath and fatigued. Pt reported PND and orthopnea. He denies leg swelling.   He denies any chest pain but did endorse abdominal pain with nausea, vomiting, and diarrhea.  He has also had a cough. He has not had any fevers or chills. No urinary symptoms. No blood in stool. Denies any recent sick contacts.   When presented to ED patient was hypoxic and tachycardic with HR 130. Pt was placed on 5L O2 which improved breathing. EKG showed flutter. Due to hypoxia and tachycardia CT angio was done to r/o PE. CT angio ruled out PE. Nephrology was consulted for possible volume overload as pt was scheduled for HD today and decided to come to ED due to not feeling well. Has developed AFL RVR and associated  hypotension planned for urgent cardioversion. Pt converted to sinus tachycardia and placed on BiPAP.   By the time we evaluted the patient, he stated that he is feeling "a whole lot better". Pt comfortably laying in bed with BIPAP on. Dry weight went from 175 to 180 kg.      Meds:  Eliquis 5 mg bid Amiodarone 200 mg twice daily Current Meds  Medication Sig   amiodarone (PACERONE) 200 MG tablet Take 2 tablets (400 mg total) TWICE daily for 2 weeks, then take 1 tablet (200 mg total) TWICE daily for 2 weeks, then take 1 tablet (200 mg total) ONCE daily   apixaban (ELIQUIS) 5 MG TABS tablet Take 1 tablet (5 mg total) by mouth 2 (two) times daily.   diclofenac Sodium (VOLTAREN) 1 % GEL Apply 4 g topically 3 (three) times daily as needed (joint pains).   lanthanum (FOSRENOL) 1000 MG chewable tablet Chew 2,000 mg by mouth 3 (three) times daily with meals.   Menthol (HALLS COUGH DROPS) 5.8 MG LOZG Use as directed 1 lozenge in the mouth or throat every 4 (four) hours as needed (cough).     Allergies: Allergies as of 05/22/2022 - Review Complete 05/22/2022  Allergen Reaction Noted   Coreg [carvedilol] Shortness Of Breath and Diarrhea 07/07/2020   Heparin Other (See Comments) 03/05/2021   Past Medical History:  Diagnosis Date   Acute on chronic respiratory failure with  hypoxia ([place]) 04/21/2021   Acute on chronic systolic (congestive) heart failure ([place]) 02/26/2020   Biventricular congestive heart failure ([place])    Last Echo 11/2019 at [facility] reveals EF 20%   Class 3 severe obesity due to excess calories with serious comorbidity and body mass index (BMI) of 50.0 to 59.9 in adult [facility]) 02/26/2020   Essential hypertension 02/26/2020   GERD without esophagitis 02/26/2020   Hidradenitis suppurativa 02/26/2020   OSA (obstructive sleep apnea) 02/26/2020   Prediabetes 02/26/2020   Renal disorder     Family History:  Father passed in 2000 (substance use disorder) Mother has heart problems,  HTN Siblings are healthy   Social History:  Works full time as a Administrator.  Does not have any kids.  No tobacco or alcohol use.  No illicit substance use.  PCP: Dr. Agustin Cree  Independent of ADL and IADLs  Review of Systems: A complete ROS was negative except as per HPI.   Physical Exam: Blood pressure 101/70, pulse (!) 101, temperature 98.6 F (37 C), temperature source Oral, resp. rate 19, SpO2 100 %.  Physical Exam Vitals and nursing note reviewed.  Constitutional:      General: He is not in acute distress.    Appearance: He is well-developed. He is obese. He is not diaphoretic.     Interventions: He is not intubated. HENT:     Head: Normocephalic.  Cardiovascular:     Rate and Rhythm: Normal rate and regular rhythm.     Heart sounds: No murmur heard.    No gallop.  Pulmonary:     Effort: Pulmonary effort is normal. Tachypnea present. No respiratory distress. He is not intubated.     Breath sounds: No decreased breath sounds.     Comments: Limited exam secondary to body habitus and BIPAP. Chest:     Chest wall: No tenderness or crepitus.  Abdominal:     General: Bowel sounds are normal.  Musculoskeletal:     Right lower leg: No edema.     Left lower leg: No edema.  Skin:    General: Skin is warm.  Neurological:     Mental Status: He is alert and oriented to person, place, and time.  Psychiatric:        Mood and Affect: Mood normal. Mood is not anxious.        Behavior: Behavior normal.      EKG: personally reviewed my interpretation is Atrial flutter with ST depression and non specific T wave abnormality   CXR: Chronic cardiomegaly with trace new fluid in the pleural fissures. Stable pulmonary vascularity but still, consider mild or developing interstitial edema.  Assessment & Plan by Problem: Principal Problem:   Acute hypoxic respiratory failure ([name])  1) Acute on chronic hypoxic respiratory failure  Pt presented to ED with worsening SOB,  palpitations, lightheadedness and fatigue. Pt have severely reduced Left ventricular ejection fraction 20 to 25% (Echo in 03/24/2022). Pt saw EP for worsening SOB and started on amiodarone. Pt missed HD today as he felt worse and decided to come to ED. Respiratory panel was negative and CT angio chest did not  r/o infectious cause. Acute hypoxic respiratory failure could be 2/2 Acute or chronic CHF secondary to decrease EF with LV systolic dysfunction, volume overload from ESRD, amiodarone toxicity and or from iron deficiency anemia.  [name] symptoms improved after successful cardioversion and with the use of  BIPAP.  - Continue BIPAP as needed -Volume management by dialysis (HD today  as per Nephrology)   2) Atrial flutter and hypotension s/p Cardioversion  Pt presented with  palpitations, SOB, lightheadedness.  Troponin's were elevated (36 and 38). BNP 373, it appears to be his baseline.  EKG reflected AFL RVR and associated hypotension. Pt was cardioverted and immediately return to sinus rhythm. Pt was placed on BIPAP. [name] symptoms improved after the cardioversion. CHA2DS2-VASc score : 2 indicating need for anticoagulation. -Cardiology consulted, appreciate recommendations: -Continue Amiodarone -Re add Midodrine -continue Eliquis -Continue BIPAP  3) ESRD on HD  Pt missed dialysis today as he had to come to ED for worsening SOB.  Wt 181.9. Dry weight between 175-180 kg. [name] electrolytes.If he becomes hemodynamically unstable consider transfer to ICU for CRRT  -Volume control via HD while in the hospital.     4) Iron deficiency Anemia Iron studies done in 03/2022 reflected iron deficiency anemia -Outpatient colonoscopy  -consider oral iron therapy   5) Anion gap metabolic acidosis  Bicarbonate 18, anion gap is 17.   We will check lactic acid  VBG  6) OSA on BIPAP  Continue BIPAP as needed and every night.   Best practices Code: FULL Diet: NPO IVF: None  Dispo: Admit patient to  Observation with expected length of stay less than 2 midnights.  Signed: Teola Bradley, MD 05/22/2022, 6:57 PM  Pager: @MYPAGER @ After 5pm on weekdays and 1pm on weekends: On Call pager: [phone]

## 2022-05-22 NOTE — ED Notes (Addendum)
Patient resting watching TV, no s/s of any distress. Patient continues on bipap. SPO2@ 99% No verbal c/o pain or discomfort. Will continue to monitor

## 2022-05-22 NOTE — ED Notes (Signed)
Patient refused to wear bipap, cardiac monitor, and go to dialysis unless he was givensomething to eat. MD made aware

## 2022-05-22 NOTE — ED Notes (Signed)
Consent for sedation and cardioversion signed on paper.  Put into medical records for scanning.

## 2022-05-23 DIAGNOSIS — I4819 Other persistent atrial fibrillation: Secondary | ICD-10-CM | POA: Diagnosis present

## 2022-05-23 DIAGNOSIS — J9621 Acute and chronic respiratory failure with hypoxia: Secondary | ICD-10-CM | POA: Diagnosis present

## 2022-05-23 DIAGNOSIS — R748 Abnormal levels of other serum enzymes: Secondary | ICD-10-CM | POA: Diagnosis not present

## 2022-05-23 DIAGNOSIS — I483 Typical atrial flutter: Secondary | ICD-10-CM | POA: Diagnosis present

## 2022-05-23 DIAGNOSIS — Z87891 Personal history of nicotine dependence: Secondary | ICD-10-CM | POA: Diagnosis not present

## 2022-05-23 DIAGNOSIS — E1122 Type 2 diabetes mellitus with diabetic chronic kidney disease: Secondary | ICD-10-CM | POA: Diagnosis present

## 2022-05-23 DIAGNOSIS — K761 Chronic passive congestion of liver: Secondary | ICD-10-CM | POA: Diagnosis present

## 2022-05-23 DIAGNOSIS — N2581 Secondary hyperparathyroidism of renal origin: Secondary | ICD-10-CM | POA: Diagnosis present

## 2022-05-23 DIAGNOSIS — I5023 Acute on chronic systolic (congestive) heart failure: Secondary | ICD-10-CM | POA: Diagnosis present

## 2022-05-23 DIAGNOSIS — K7200 Acute and subacute hepatic failure without coma: Secondary | ICD-10-CM | POA: Diagnosis not present

## 2022-05-23 DIAGNOSIS — J189 Pneumonia, unspecified organism: Secondary | ICD-10-CM | POA: Diagnosis present

## 2022-05-23 DIAGNOSIS — R7982 Elevated C-reactive protein (CRP): Secondary | ICD-10-CM | POA: Diagnosis not present

## 2022-05-23 DIAGNOSIS — E1165 Type 2 diabetes mellitus with hyperglycemia: Secondary | ICD-10-CM | POA: Diagnosis present

## 2022-05-23 DIAGNOSIS — R918 Other nonspecific abnormal finding of lung field: Secondary | ICD-10-CM | POA: Diagnosis not present

## 2022-05-23 DIAGNOSIS — G4733 Obstructive sleep apnea (adult) (pediatric): Secondary | ICD-10-CM | POA: Diagnosis not present

## 2022-05-23 DIAGNOSIS — I5022 Chronic systolic (congestive) heart failure: Secondary | ICD-10-CM | POA: Diagnosis not present

## 2022-05-23 DIAGNOSIS — E662 Morbid (severe) obesity with alveolar hypoventilation: Secondary | ICD-10-CM | POA: Diagnosis present

## 2022-05-23 DIAGNOSIS — J9601 Acute respiratory failure with hypoxia: Secondary | ICD-10-CM | POA: Diagnosis not present

## 2022-05-23 DIAGNOSIS — Z6841 Body Mass Index (BMI) 40.0 and over, adult: Secondary | ICD-10-CM | POA: Diagnosis not present

## 2022-05-23 DIAGNOSIS — I5043 Acute on chronic combined systolic (congestive) and diastolic (congestive) heart failure: Secondary | ICD-10-CM | POA: Diagnosis not present

## 2022-05-23 DIAGNOSIS — X58XXXA Exposure to other specified factors, initial encounter: Secondary | ICD-10-CM | POA: Diagnosis present

## 2022-05-23 DIAGNOSIS — R0602 Shortness of breath: Secondary | ICD-10-CM | POA: Diagnosis present

## 2022-05-23 DIAGNOSIS — I4892 Unspecified atrial flutter: Secondary | ICD-10-CM | POA: Diagnosis not present

## 2022-05-23 DIAGNOSIS — Z992 Dependence on renal dialysis: Secondary | ICD-10-CM | POA: Diagnosis not present

## 2022-05-23 DIAGNOSIS — R053 Chronic cough: Secondary | ICD-10-CM | POA: Diagnosis not present

## 2022-05-23 DIAGNOSIS — E872 Acidosis, unspecified: Secondary | ICD-10-CM | POA: Diagnosis present

## 2022-05-23 DIAGNOSIS — I132 Hypertensive heart and chronic kidney disease with heart failure and with stage 5 chronic kidney disease, or end stage renal disease: Secondary | ICD-10-CM | POA: Diagnosis present

## 2022-05-23 DIAGNOSIS — Z20822 Contact with and (suspected) exposure to covid-19: Secondary | ICD-10-CM | POA: Diagnosis present

## 2022-05-23 DIAGNOSIS — I428 Other cardiomyopathies: Secondary | ICD-10-CM | POA: Diagnosis present

## 2022-05-23 DIAGNOSIS — N186 End stage renal disease: Secondary | ICD-10-CM | POA: Diagnosis present

## 2022-05-23 DIAGNOSIS — I5020 Unspecified systolic (congestive) heart failure: Secondary | ICD-10-CM | POA: Diagnosis not present

## 2022-05-23 DIAGNOSIS — D509 Iron deficiency anemia, unspecified: Secondary | ICD-10-CM | POA: Diagnosis present

## 2022-05-23 DIAGNOSIS — R700 Elevated erythrocyte sedimentation rate: Secondary | ICD-10-CM | POA: Diagnosis not present

## 2022-05-23 DIAGNOSIS — S36119A Unspecified injury of liver, initial encounter: Secondary | ICD-10-CM | POA: Diagnosis present

## 2022-05-23 DIAGNOSIS — I272 Pulmonary hypertension, unspecified: Secondary | ICD-10-CM | POA: Diagnosis present

## 2022-05-23 DIAGNOSIS — I340 Nonrheumatic mitral (valve) insufficiency: Secondary | ICD-10-CM | POA: Diagnosis present

## 2022-05-23 DIAGNOSIS — D689 Coagulation defect, unspecified: Secondary | ICD-10-CM | POA: Diagnosis present

## 2022-05-23 DIAGNOSIS — E871 Hypo-osmolality and hyponatremia: Secondary | ICD-10-CM | POA: Diagnosis present

## 2022-05-23 DIAGNOSIS — I484 Atypical atrial flutter: Secondary | ICD-10-CM | POA: Diagnosis not present

## 2022-05-23 LAB — HIV ANTIBODY (ROUTINE TESTING W REFLEX): HIV Screen 4th Generation wRfx: NONREACTIVE

## 2022-05-23 LAB — CBC
HCT: 31.4 % — ABNORMAL LOW (ref 39.0–52.0)
Hemoglobin: 10.9 g/dL — ABNORMAL LOW (ref 13.0–17.0)
MCH: 35.3 pg — ABNORMAL HIGH (ref 26.0–34.0)
MCHC: 34.7 g/dL (ref 30.0–36.0)
MCV: 101.6 fL — ABNORMAL HIGH (ref 80.0–100.0)
Platelets: 250 10*3/uL (ref 150–400)
RBC: 3.09 MIL/uL — ABNORMAL LOW (ref 4.22–5.81)
RDW: 14.5 % (ref 11.5–15.5)
WBC: 13.9 10*3/uL — ABNORMAL HIGH (ref 4.0–10.5)
nRBC: 0 % (ref 0.0–0.2)

## 2022-05-23 LAB — BASIC METABOLIC PANEL
Anion gap: 17 — ABNORMAL HIGH (ref 5–15)
BUN: 42 mg/dL — ABNORMAL HIGH (ref 6–20)
CO2: 24 mmol/L (ref 22–32)
Calcium: 9.4 mg/dL (ref 8.9–10.3)
Chloride: 92 mmol/L — ABNORMAL LOW (ref 98–111)
Creatinine, Ser: 9.39 mg/dL — ABNORMAL HIGH (ref 0.61–1.24)
GFR, Estimated: 7 mL/min — ABNORMAL LOW (ref >60)
Glucose, Bld: 133 mg/dL — ABNORMAL HIGH (ref 70–99)
Potassium: 3.5 mmol/L (ref 3.5–5.1)
Sodium: 133 mmol/L — ABNORMAL LOW (ref 135–145)

## 2022-05-23 LAB — HEPATITIS B SURFACE ANTIBODY, QUANTITATIVE: Hep B S AB Quant (Post): 264.2 m[IU]/mL (ref >9.9)

## 2022-05-23 MED ORDER — LIDOCAINE HCL (PF) 1 % IJ SOLN: 5.0000 mL | INTRAMUSCULAR | Status: DC | PRN

## 2022-05-23 MED ORDER — APIXABAN 5 MG PO TABS
5.0000 mg | ORAL_TABLET | Freq: Two times a day (BID) | ORAL | Status: DC
  Administered 2022-05-23 – 2022-06-10 (×35): 5 mg via ORAL
  Filled 2022-05-23 (×35): qty 1

## 2022-05-23 MED ORDER — ANTICOAGULANT SODIUM CITRATE 4% (200MG/5ML) IV SOLN
5.0000 mL | Freq: Once | Status: AC
  Administered 2022-05-28: 5 mL
  Filled 2022-05-23 (×3): qty 5

## 2022-05-23 MED ORDER — AMIODARONE HCL 200 MG PO TABS
400.0000 mg | ORAL_TABLET | Freq: Two times a day (BID) | ORAL | Status: DC
  Administered 2022-05-24: 200 mg via ORAL
  Filled 2022-05-23 (×2): qty 2

## 2022-05-23 MED ORDER — PENTAFLUOROPROP-TETRAFLUOROETH EX AERO: 1.0000 | INHALATION_SPRAY | CUTANEOUS | Status: DC | PRN

## 2022-05-23 MED ORDER — ANTICOAGULANT SODIUM CITRATE 4% (200MG/5ML) IV SOLN: 5.0000 mL | Status: DC | PRN

## 2022-05-23 MED ORDER — LIDOCAINE-PRILOCAINE 2.5-2.5 % EX CREA: 1.0000 | TOPICAL_CREAM | CUTANEOUS | Status: DC | PRN

## 2022-05-23 MED ORDER — ALTEPLASE 2 MG IJ SOLR: 2.0000 mg | Freq: Once | INTRAMUSCULAR | Status: DC | PRN

## 2022-05-23 MED ORDER — FUROSEMIDE 10 MG/ML IJ SOLN
160.0000 mg | Freq: Once | INTRAVENOUS | Status: DC
  Filled 2022-05-23: qty 16

## 2022-05-23 NOTE — Progress Notes (Signed)
   05/23/22 2321  Provider Notification  Provider Name/Title Dr. Kalman Shan  Date Provider Notified 05/23/22  Time Provider Notified 2321  Method of Notification Page (Text page via [place])  Notification Reason Other (Comment) (Amio Refusal)   Pt refusing PO Amio endorsing it causes him "multiple issues". Pt educated on it's necessity but still refuses.

## 2022-05-23 NOTE — Progress Notes (Signed)
On arrival to the unit patient stated that he was leaving if he will be NPO.  MD on call notified and a verbal order for drinks received.  Patient agreed to stay since he is offered juice.  He is in bed resting.  Will continue to monitor.

## 2022-05-23 NOTE — Progress Notes (Deleted)
  [facility] Progress Note   Subjective:  Seen in room. Remains on 6L [place]. Had HD last night with 2.5L removed. Signed off early d/t cramping. NSR on monitor. No cp, palpitations. Agrees to extra dialysis today for   Objective Vitals:   05/23/22 0350 05/23/22 0355 05/23/22 0425 05/23/22 0527  BP:  97/73  109/84  Pulse: 90 90  97  Resp: (!) 28 (!) 39  20  Temp:    98.1 F (36.7 C)  TempSrc:    Oral  SpO2: (!) 87% (!) 83% 94% 92%  Weight:    (!) 179.8 kg  Height:    6' (1.829 m)      Additional Objective Labs: Basic Metabolic Panel: Recent Labs  Lab 05/22/22 0732 05/22/22 2058 05/23/22 0550  NA 132* 132* 133*  K 4.5 4.7 3.5  CL 97*  --  92*  CO2 18*  --  24  GLUCOSE 140*  --  133*  BUN 51*  --  42*  CREATININE 11.59*  --  9.39*  CALCIUM 9.6  --  9.4   CBC: Recent Labs  Lab 05/22/22 0732 05/22/22 2058 05/23/22 0550  WBC 12.2*  --  13.9*  HGB 10.6* 10.9* 10.9*  HCT 30.8* 32.0* 31.4*  MCV 102.0*  --  101.6*  PLT 263  --  250   Blood Culture    Component Value Date/Time   SDES BLOOD LEFT HAND 03/26/2022 1958   SDES BLOOD RIGHT HAND 03/26/2022 1958   [place]  03/26/2022 1958    BOTTLES DRAWN AEROBIC ONLY Blood Culture adequate volume   SPECREQUEST  03/26/2022 1958    BOTTLES DRAWN AEROBIC ONLY Blood Culture adequate volume   CULT  03/26/2022 1958    NO GROWTH 5 DAYS Performed at [facility], [place] [address] [place], [place] [address]    CULT  03/26/2022 1958    NO GROWTH 5 DAYS Performed at [facility] [address] [place], [place] [address]    REPTSTATUS 03/31/2022 FINAL 03/26/2022 1958   REPTSTATUS 03/31/2022 FINAL 03/26/2022 1958     Physical Exam General: Alert, on nasal oxygen, nad  Heart: RRR Lungs: Occasional rhonci  Abdomen: soft non-tender Extremities: no significant LE edema  Dialysis Access: [facility]   Medications:  anticoagulant sodium citrate      amiodarone  400 mg Oral BID   apixaban  5 mg Oral  BID   etomidate  15 mg Intravenous Once   midodrine  10 mg Oral TID WC    Dialysis Orders:  Unit: [facility] TTS Time: 4:15  EDW: 180 kg Flows: 425/A1.5x Bath: 3K/2.5Ca  Access: TDC  Heparin: None/HIT Ab POSITIVE -Mircera 120 q 4 wks (last 10/12) -Hectorol 6 TIW, Sensipar 30 TIW   Assessment/Plan: AFlutter/RVR - s/p cardioversion. On amiodarone. Per primary/cardiology  Respiratory distress - required BiPAP after cardioversion. Remains on 6L [place]. Try to get volume down with extra UF today.  ESRD -  HD TTS. Shortened HD yesterday. Plan for extra treatment today for additional UF.  BP/Volume  - Chronic volume overload. Tolerates 4-5L UF goals. Midodrine 10 TID for BP support. Torsemide 100 non dialysis days. Anemia  - Hgb stable. On ESA as outpatient  Metabolic bone disease -  Continue Forsrenol binder/Sensipar/Hectorol  if admitted  HFrEF 20-25%  Lynnda Child PA-C [facility] 05/23/2022,9:02 AM

## 2022-05-23 NOTE — Plan of Care (Signed)
°  Problem: Clinical Measurements: °Goal: Cardiovascular complication will be avoided °Outcome: Progressing °  °Problem: Activity: °Goal: Risk for activity intolerance will decrease °Outcome: Progressing °  °

## 2022-05-23 NOTE — Progress Notes (Signed)
HD#0 Subjective:   Summary:   Phillip Young is a 36 yo male with history of acute on chronic respiratory failure with hypoxia, HFrEF, OSA on BIPAP, morbid obesity, ESRD on HD presented with shortness of breath, lightheadedness, and palpitations admitted with acute on chronic respiratory failure with hypoxia.   The symptoms have been rapidly worsening over the last 2 days.The patient states that he couldn't take more than 10 steps without feeling short of breath and fatigued. Pt reported PND and orthopnea. Denied chest pain or leg swelling.   When presented to ED patient was hypoxic and tachycardic with HR 130. Pt was placed on 5L O2 which improved breathing.Has developed AFL RVR and associated hypotension. Pt was for urgently cardioverted. Pt converted to sinus tachycardia and placed on BiPAP. Symptoms improved post cardioversion. Pt was dialyzed after consulting with the Nephrologist. Pt remained in sinus rhythm, hemodynamically stable, on O2 4-6 L via [place].  Overnight Events: No overnight events.     Objective:  Vital signs in last 24 hours: Vitals:   05/23/22 0355 05/23/22 0425 05/23/22 0527 05/23/22 0840  BP: 97/73  109/84 (!) 102/50  Pulse: 90  97 100  Resp: (!) 39  20 20  Temp:   98.1 F (36.7 C) (!) 97.4 F (36.3 C)  TempSrc:   Oral Oral  SpO2: (!) 83% 94% 92% 98%  Weight:   (!) 179.8 kg   Height:   6' (1.829 m)    Supplemental O2: Nasal Cannula SpO2: 98 % O2 Flow Rate (L/min): 4 L/min FiO2 (%): 60 %   Physical Exam:   Physical Exam Vitals and nursing note reviewed.  Constitutional:      General: He is in acute distress (hypoxic at rest).     Appearance: He is well-developed. He is obese.     Interventions: He is not intubated. HENT:     Head: Normocephalic.  Cardiovascular:     Rate and Rhythm: Regular rhythm. Tachycardia present.  Pulmonary:     Effort: Tachypnea present. No accessory muscle usage. He is not intubated.     Breath sounds: No decreased  air movement.     Comments: Slightly diminished breath sounds, likely form the body habitus  Chest:     Chest wall: No tenderness.  Musculoskeletal:     Cervical back: Neck supple.     Right lower leg: No edema.     Left lower leg: No edema.  Skin:    General: Skin is warm.  Neurological:     Mental Status: He is alert.  Psychiatric:        Mood and Affect: Mood normal.        Behavior: Behavior normal.      Filed Weights   05/23/22 0527  Weight: (!) 179.8 kg     Intake/Output Summary (Last 24 hours) at 05/23/2022 1240 Last data filed at 05/23/2022 0305 Gross per 24 hour  Intake --  Output 2500 ml  Net -2500 ml   Net IO Since Admission: -2,500 mL [05/23/22 1240]  Pertinent Labs:    Latest Ref Rng & Units 05/23/2022    5:50 AM 05/22/2022    8:58 PM 05/22/2022    7:32 AM  CBC  WBC 4.0 - 10.5 K/uL 13.9   12.2   Hemoglobin 13.0 - 17.0 g/dL 10.9  10.9  10.6   Hematocrit 39.0 - 52.0 % 31.4  32.0  30.8   Platelets 150 - 400 K/uL 250   263  Latest Ref Rng & Units 05/23/2022    5:50 AM 05/22/2022    8:58 PM 05/22/2022    7:32 AM  CMP  Glucose 70 - 99 mg/dL 133   140   BUN 6 - 20 mg/dL 42   51   Creatinine 0.61 - 1.24 mg/dL 9.39   11.59   Sodium 135 - 145 mmol/L 133  132  132   Potassium 3.5 - 5.1 mmol/L 3.5  4.7  4.5   Chloride 98 - 111 mmol/L 92   97   CO2 22 - 32 mmol/L 24   18   Calcium 8.9 - 10.3 mg/dL 9.4   9.6     Imaging: No results found.  Assessment/Plan:   Principal Problem:   Acute hypoxic respiratory failure ([name]) Active Problems:   OSA (obstructive sleep apnea)   Chronic systolic CHF (congestive heart failure) ([name])   Paroxysmal atrial flutter ([name])   ESRD (end stage renal disease) on dialysis ([place])   Hypotension   Patient Summary: Phillip Young is a 36 y.o. with a pertinent PMH of acute on chronic respiratory failure with hypoxia, HFrEF, OSA on BIPAP, morbid obesity, ESRD on HD presented with shortness of breath,  lightheadedness, and palpitations admitted with acute on chronic respiratory failure with hypoxia.   Assessment & Plan by Problem: Principal Problem:   Acute hypoxic respiratory failure ([name])   1) Acute on chronic hypoxic respiratory failure  Pt presented to ED with worsening SOB, palpitations, lightheadedness and fatigue. Pt have severely reduced Left ventricular ejection fraction 20 to 25% (Echo in 03/24/2022).  Respiratory panel was negative and CT angio chest  r/o PE and infectious cause. Diffuse ground-glass throughout the lungs suggest pulmonary venous congestion.    Acute hypoxic respiratory failure could be 2/2 Obesity hypoventilation syndrome, Acute or chronic CHF secondary to decrease EF with LV systolic dysfunction, Afib/A Flutter, volume overload from ESRD,or iron deficiency anemia. Pt had HD last night with 2.5 L removed. HD was shortened as pt c/o of cramping. Pt is scheduled dialysis tomorrow for additional UF.  Pt is hemodynamically stable, tachypneic and hypoxic with slight movement with obvious desaturation, on O2 -6L via [place].   - Continue BIPAP at night -Volume management by dialysis -scheduled for tomorrow. -Midodrine 10 TID for BP support. Torsemide 100 non dialysis days- as per Nephrology recommendations. -Repeat BMP     2) Atrial flutter and hypotension s/p Cardioversion Arrhythmia set off likely due to obesity hypoventilation syndrome, Hypoxemia, Afib,  anemia, CHF. Pt remains in sinus rhythm post cardioversion. Pt hemodynamically stable. Denies CP, palpations.   CHA2DS2-VASc score : 2 indicating need for anticoagulation. -Cardiology consulted, appreciate recommendations: -Continue Amiodarone -Re add Midodrine -continue Eliquis -Continue BIPAP   3) ESRD on HD  Pt had dialysis last night . 2.5 L removed. Dialysis was stopped early due to cramping. Pt will be dialyzed again tomorrow as per Nephrology -Volume control via HD while in the hospital.     4) Iron  deficiency Anemia Iron studies done in 03/2022 reflected iron deficiency anemia -Outpatient colonoscopy  -consider iron Infusion therapy  -Repeat CBC   5) Anion gap metabolic acidosis  Bicarbonate 18, anion gap is 17.   Lactic acid WNL  VBG: Pt not acidic. Repeat BMP with CO2 24.    6) OSA on BIPAP  Continue BIPAP as needed and every night.     Best practices Code: FULL Diet: NPO IVF: None     Dispo: Anticipated discharge to Home in 2  days pending Improvement in respiratory status   Teola Bradley, MD Internal Medicine Resident PGY-1 Pager: [phone] Please contact the on call pager after 5 pm and on weekends at [phone].

## 2022-05-23 NOTE — Progress Notes (Signed)
  [facility] Progress Note   Subjective:  Seen in room. Remains on 6L [place]. Had HD last night with 2.5L removed. Signed off early d/t cramping. NSR on monitor. No cp, palpitations. Wishes to have extra treatment tomorrow  Objective Vitals:   05/23/22 0355 05/23/22 0425 05/23/22 0527 05/23/22 0840  BP: 97/73  109/84 (!) 102/50  Pulse: 90  97 100  Resp: (!) 39  20 20  Temp:   98.1 F (36.7 C)   TempSrc:   Oral Oral  SpO2: (!) 83% 94% 92% 98%  Weight:   (!) 179.8 kg   Height:   6' (1.829 m)       Additional Objective Labs: Basic Metabolic Panel: Recent Labs  Lab 05/22/22 0732 05/22/22 2058 05/23/22 0550  NA 132* 132* 133*  K 4.5 4.7 3.5  CL 97*  --  92*  CO2 18*  --  24  GLUCOSE 140*  --  133*  BUN 51*  --  42*  CREATININE 11.59*  --  9.39*  CALCIUM 9.6  --  9.4    CBC: Recent Labs  Lab 05/22/22 0732 05/22/22 2058 05/23/22 0550  WBC 12.2*  --  13.9*  HGB 10.6* 10.9* 10.9*  HCT 30.8* 32.0* 31.4*  MCV 102.0*  --  101.6*  PLT 263  --  250    Blood Culture    Component Value Date/Time   SDES BLOOD LEFT HAND 03/26/2022 1958   SDES BLOOD RIGHT HAND 03/26/2022 1958   SPECREQUEST  03/26/2022 1958    BOTTLES DRAWN AEROBIC ONLY Blood Culture adequate volume   SPECREQUEST  03/26/2022 1958    BOTTLES DRAWN AEROBIC ONLY Blood Culture adequate volume   CULT  03/26/2022 1958    NO GROWTH 5 DAYS Performed at [facility], [place] [address], [place], [place] [address]    CULT  03/26/2022 1958    NO GROWTH 5 DAYS Performed at [facility] [address], [place], [place] [address]    REPTSTATUS 03/31/2022 FINAL 03/26/2022 1958   REPTSTATUS 03/31/2022 FINAL 03/26/2022 1958     Physical Exam General: Alert, on nasal oxygen, nad  Heart: RRR Lungs: Occasional rhonci  Abdomen: soft non-tender Extremities: no significant LE edema  Dialysis Access: [facility]   Medications:  anticoagulant sodium citrate      amiodarone  400 mg Oral BID    apixaban  5 mg Oral BID   etomidate  15 mg Intravenous Once   midodrine  10 mg Oral TID WC    Dialysis Orders:  Unit: [facility] TTS Time: 4:15  EDW: 180 kg Flows: 425/A1.5x Bath: 3K/2.5Ca  Access: TDC  Heparin: None/HIT Ab POSITIVE -Mircera 120 q 4 wks (last 10/12) -Hectorol 6 TIW, Sensipar 30 TIW   Assessment/Plan: AFlutter/RVR - s/p cardioversion. On amiodarone. Per primary/cardiology  Respiratory distress - required BiPAP after cardioversion. Remains on 6L . Try to get volume down with extra UF tomorrow. ESRD -  HD TTS. Shortened HD yesterday due to cramps. Plan for extra treatment tomorrow for additional UF.  BP/Volume  - Chronic volume overload. Tolerates 4-5L UF goals. Midodrine 10 TID for BP support. Torsemide 100 non dialysis days. Will try high dose lasix today. Anemia  - Hgb stable. On ESA as outpatient  Metabolic bone disease -  Continue Forsrenol binder/Sensipar/Hectorol  if admitted  HFrEF 20-25%  Jannifer Hick MD [place] Pager [phone]

## 2022-05-23 NOTE — Progress Notes (Signed)
Received patient in stretcher to unit.  Alert and oriented.  Informed consent signed and in chart.   Treatment initiated: 2319 Treatment completed: 0300  Patient tolerated well.  Transported back to the room  Alert, without acute distress.  Hand-off given to patient's nurse.   Access used: catheter Access issues: none  Total UF removed: 2500 Medication(s) given: 0 Post HD VS: 98. 111/71 90 29 BIPAP Post HD weight: unable to obtain, on stretcher.  HD tx not achieved as scheduled, pt requested to be off the machine prior to the time schedule.   [facility]

## 2022-05-23 NOTE — Progress Notes (Signed)
Progress Note  Patient Name: Phillip Young Date of Encounter: 05/23/2022  Primary Cardiologist: AHF- [name]  Subjective   Overnight BP stabilized. Patient notes No CP or palpitations. Somnolent but rousable  Inpatient Medications    Scheduled Meds:  amiodarone  400 mg Oral BID   apixaban  5 mg Oral BID   etomidate  15 mg Intravenous Once   midodrine  10 mg Oral TID WC   Continuous Infusions:  anticoagulant sodium citrate     furosemide     PRN Meds: acetaminophen **OR** acetaminophen, senna-docusate   Vital Signs    Vitals:   05/23/22 0355 05/23/22 0425 05/23/22 0527 05/23/22 0840  BP: 97/73  109/84 (!) 102/50  Pulse: 90  97 100  Resp: (!) 39  20 20  Temp:   98.1 F (36.7 C) (!) 97.4 F (36.3 C)  TempSrc:   Oral Oral  SpO2: (!) 83% 94% 92% 98%  Weight:   (!) 179.8 kg   Height:   6' (1.829 m)     Intake/Output Summary (Last 24 hours) at 05/23/2022 1237 Last data filed at 05/23/2022 0305 Gross per 24 hour  Intake --  Output 2500 ml  Net -2500 ml   Filed Weights   05/23/22 0527  Weight: (!) 179.8 kg    Telemetry    SR- off tele right now - Personally Reviewed  Physical Exam   Gen: no distress, morbid obesity   Cardiac: No Rubs or Gallops, no murmur, RRR distant heart sounds Respiratory: regular rhythm GI: Soft, nontender, non-distended  MS: non potting edema;  moves all extremities Integument: Skin feels warm Neuro:  At time of evaluation, alert and oriented to person/place/time/situation  Psych: Normal affect, patient feels ok   Labs    Chemistry Recent Labs  Lab 05/22/22 0732 05/22/22 2058 05/23/22 0550  NA 132* 132* 133*  K 4.5 4.7 3.5  CL 97*  --  92*  CO2 18*  --  24  GLUCOSE 140*  --  133*  BUN 51*  --  42*  CREATININE 11.59*  --  9.39*  CALCIUM 9.6  --  9.4  GFRNONAA 5*  --  7*  ANIONGAP 17*  --  17*     Hematology Recent Labs  Lab 05/22/22 0732 05/22/22 2058 05/23/22 0550  WBC 12.2*  --  13.9*  RBC 3.02*   --  3.09*  HGB 10.6* 10.9* 10.9*  HCT 30.8* 32.0* 31.4*  MCV 102.0*  --  101.6*  MCH 35.1*  --  35.3*  MCHC 34.4  --  34.7  RDW 14.6  --  14.5  PLT 263  --  250    Cardiac EnzymesNo results for input(s): "TROPONINI" in the last 168 hours. No results for input(s): "TROPIPOC" in the last 168 hours.   BNP Recent Labs  Lab 05/22/22 0732  BNP 373.1*     DDimer No results for input(s): "DDIMER" in the last 168 hours.   Radiology    CT Angio Chest PE W and/or Wo Contrast  Result Date: 05/22/2022 CLINICAL DATA:  Chest pain and shortness of breath. Evaluate for pulmonary embolism. EXAM: CT ANGIOGRAPHY CHEST WITH CONTRAST TECHNIQUE: Multidetector CT imaging of the chest was performed using the standard protocol during bolus administration of intravenous contrast. Multiplanar CT image reconstructions and MIPs were obtained to evaluate the vascular anatomy. RADIATION DOSE REDUCTION: This exam was performed according to the departmental dose-optimization program which includes automated exposure control, adjustment of the mA and/or kV  according to patient size and/or use of iterative reconstruction technique. CONTRAST:  31mL OMNIPAQUE IOHEXOL 350 MG/ML SOLN COMPARISON:  August 18, 2021 CT of the chest. Chest x-ray May 22, 2022. FINDINGS: Cardiovascular: The thoracic aorta is normal in caliber. No aneurysm or dissection identified. No atherosclerotic change. Cardiomegaly is stable. No pulmonary artery dilatation identified. The attenuation of the opacified pulmonary artery is 245 Hounsfield units demonstrating a moderate quality study. The main and lobar pulmonary arteries are well opacified. Evaluation at the level of the segmental pulmonary arteries and distal is limited due to artifact. Within this limitation, no pulmonary emboli are identified. Mediastinum/Nodes: Mediastinal adenopathy remains a right paratracheal node on series 8, image 44 measures 2 cm in diameter today, stable. An AP window  node on series 8, image 48 measures 2.4 cm today versus 2.2 cm on the August 18, 2021 study and 2.2 cm on the October 09, 2020 study. Bilateral hilar adenopathy is similar in the interval. Overall, the adenopathy is stable to slightly more prominent in the interval. The thyroid and esophagus are normal. No pleural effusions or pericardial effusions noted. Bilateral gynecomastia is identified. No other chest wall abnormalities are noted. Lungs/Pleura: Evaluation of the lungs is limited due to patient body habitus. Diffuse ground-glass opacity is identified in the lungs with a few regions of decreased attenuation, likely air trapping. A calcified nodules identified in the left lung base, of no significance. No suspicious pulmonary nodules or masses are identified. Upper Abdomen: No acute abnormality. Musculoskeletal: No chest wall abnormality. No acute or significant osseous findings. Review of the MIP images confirms the above findings. IMPRESSION: 1. No pulmonary emboli identified in the main or lobar pulmonary arteries. Evaluation at and beyond the segmental level is limited. 2. Cardiomegaly. 3. Diffuse ground-glass throughout the lungs suggest pulmonary venous congestion. Regions of decreased attenuation suggests air trapping as well. Recommend clinical correlation. 4. Adenopathy in the mediastinum and hila is stable to mildly more prominent the interval. This finding is nonspecific but has been present since February 19, 2020. Electronically Signed   By: Dorise Bullion III M.D.   On: 05/22/2022 11:07   DG Chest 2 View  Result Date: 05/22/2022 CLINICAL DATA:  36 year old male with chest pain and shortness of breath. EXAM: CHEST - 2 VIEW COMPARISON:  Chest radiographs 03/26/2022 and earlier. FINDINGS: PA and lateral views at 0818 hours. Stable cardiomegaly and mediastinal contours. Stable right chest dual lumen dialysis type catheter. Visualized tracheal air column is within normal limits. Trace fluid now in the  pleural fissures appears new since September. But pulmonary vascularity appears stable. No dependent pleural effusion. No consolidation or confluent opacity. No acute osseous abnormality identified. Negative visible bowel gas. IMPRESSION: Chronic cardiomegaly with trace new fluid in the pleural fissures. Stable pulmonary vascularity but still, consider mild or developing interstitial edema. Electronically Signed   By: Genevie Ann M.D.   On: 05/22/2022 08:42     Patient Profile     36 y.o. male HF and AFL  Assessment & Plan    AFL RVR HFrEF Super Morbid Obesity ESRD - s/p urgent DCCV - continue midodrine and amiodarone - unable to add GDMT for BP; would not challenge with vericiguat inpatient - continue eliquis    For questions or updates, please contact [facility] Please consult [email or web address] for contact info under Cardiology/STEMI.      Rudean Haskell, MD [address] [place], [place] [address] [phone]  [phone]  12:37 PM

## 2022-05-24 ENCOUNTER — Ambulatory Visit: Payer: BC Managed Care – PPO | Admitting: Surgery

## 2022-05-24 DIAGNOSIS — N186 End stage renal disease: Secondary | ICD-10-CM | POA: Diagnosis not present

## 2022-05-24 DIAGNOSIS — I5022 Chronic systolic (congestive) heart failure: Secondary | ICD-10-CM | POA: Diagnosis not present

## 2022-05-24 DIAGNOSIS — Z992 Dependence on renal dialysis: Secondary | ICD-10-CM

## 2022-05-24 DIAGNOSIS — Z87891 Personal history of nicotine dependence: Secondary | ICD-10-CM

## 2022-05-24 DIAGNOSIS — J9601 Acute respiratory failure with hypoxia: Secondary | ICD-10-CM | POA: Diagnosis not present

## 2022-05-24 LAB — CBC
HCT: 27.8 % — ABNORMAL LOW (ref 39.0–52.0)
Hemoglobin: 9.7 g/dL — ABNORMAL LOW (ref 13.0–17.0)
MCH: 35.1 pg — ABNORMAL HIGH (ref 26.0–34.0)
MCHC: 34.9 g/dL (ref 30.0–36.0)
MCV: 100.7 fL — ABNORMAL HIGH (ref 80.0–100.0)
Platelets: 227 10*3/uL (ref 150–400)
RBC: 2.76 MIL/uL — ABNORMAL LOW (ref 4.22–5.81)
RDW: 14.5 % (ref 11.5–15.5)
WBC: 11.9 10*3/uL — ABNORMAL HIGH (ref 4.0–10.5)
nRBC: 0 % (ref 0.0–0.2)

## 2022-05-24 LAB — RENAL FUNCTION PANEL
Albumin: 3.4 g/dL — ABNORMAL LOW (ref 3.5–5.0)
Anion gap: 18 — ABNORMAL HIGH (ref 5–15)
BUN: 60 mg/dL — ABNORMAL HIGH (ref 6–20)
CO2: 22 mmol/L (ref 22–32)
Calcium: 9.1 mg/dL (ref 8.9–10.3)
Chloride: 93 mmol/L — ABNORMAL LOW (ref 98–111)
Creatinine, Ser: 11.57 mg/dL — ABNORMAL HIGH (ref 0.61–1.24)
GFR, Estimated: 5 mL/min — ABNORMAL LOW
Glucose, Bld: 106 mg/dL — ABNORMAL HIGH (ref 70–99)
Phosphorus: 6.6 mg/dL — ABNORMAL HIGH (ref 2.5–4.6)
Potassium: 4.5 mmol/L (ref 3.5–5.1)
Sodium: 133 mmol/L — ABNORMAL LOW (ref 135–145)

## 2022-05-24 MED ORDER — CHLORHEXIDINE GLUCONATE CLOTH 2 % EX PADS
6.0000 | MEDICATED_PAD | Freq: Every day | CUTANEOUS | Status: DC
  Administered 2022-05-25 – 2022-06-02 (×7): 6 via TOPICAL

## 2022-05-24 MED ORDER — SODIUM CHLORIDE 0.9 % IV SOLN
250.0000 mg | Freq: Every day | INTRAVENOUS | Status: AC
  Administered 2022-05-24 – 2022-05-25 (×2): 250 mg via INTRAVENOUS
  Filled 2022-05-24 (×2): qty 20

## 2022-05-24 MED ORDER — AMIODARONE HCL 200 MG PO TABS
200.0000 mg | ORAL_TABLET | Freq: Every day | ORAL | Status: DC
  Administered 2022-05-25 – 2022-05-27 (×2): 200 mg via ORAL
  Filled 2022-05-24 (×4): qty 1

## 2022-05-24 NOTE — Progress Notes (Signed)
Heart Failure Navigator Progress Note  Assessed for Heart & Vascular TOC clinic readiness.  Patient does not meet criteria due to ESRD on hemodialysis.     Patryce Depriest, BSN, RN Heart Failure Nurse Navigator Secure Chat Only   

## 2022-05-24 NOTE — Progress Notes (Signed)
[name] Progress Note   Subjective: Seen in room lying flat with BIPAP in place. SR on monitor. HD tomorrow on schedule.     Objective Vitals:   05/24/22 0909 05/24/22 0935 05/24/22 1035 05/24/22 1239  BP:  (!) 139/97 115/73 102/63  Pulse: 85 (!) 101 92 85  Resp: (!) 30 (!) 24 (!) 22 20  Temp:  97.7 F (36.5 C) 97.9 F (36.6 C)   TempSrc:  Oral Oral   SpO2: 97% 96% 99% 99%  Weight:      Height:       Physical Exam General: Obese male in NAD Heart: HS distant D/T body habitus. S1,S2 RRR SR on monitor Lungs: CTAB slightly decreased in bases Abdomen: Obese, NABS Extremities:No LE edema Dialysis Access: [facility] drsg intact    Additional Objective Labs: Basic Metabolic Panel: Recent Labs  Lab 05/22/22 0732 05/22/22 2058 05/23/22 0550 05/24/22 0315  NA 132* 132* 133* 133*  K 4.5 4.7 3.5 4.5  CL 97*  --  92* 93*  CO2 18*  --  24 22  GLUCOSE 140*  --  133* 106*  BUN 51*  --  42* 60*  CREATININE 11.59*  --  9.39* 11.57*  CALCIUM 9.6  --  9.4 9.1  PHOS  --   --   --  6.6*   Liver Function Tests: Recent Labs  Lab 05/24/22 0315  ALBUMIN 3.4*   No results for input(s): "LIPASE", "AMYLASE" in the last 168 hours. CBC: Recent Labs  Lab 05/22/22 0732 05/22/22 2058 05/23/22 0550 05/24/22 0315  WBC 12.2*  --  13.9* 11.9*  HGB 10.6* 10.9* 10.9* 9.7*  HCT 30.8* 32.0* 31.4* 27.8*  MCV 102.0*  --  101.6* 100.7*  PLT 263  --  250 227   Blood Culture    Component Value Date/Time   SDES BLOOD LEFT HAND 03/26/2022 1958   SDES BLOOD RIGHT HAND 03/26/2022 1958   Sharon  03/26/2022 1958    BOTTLES DRAWN AEROBIC ONLY Blood Culture adequate volume   SPECREQUEST  03/26/2022 1958    BOTTLES DRAWN AEROBIC ONLY Blood Culture adequate volume   CULT  03/26/2022 1958    NO GROWTH 5 DAYS Performed at [facility] [address], [place], [place] [address]    CULT  03/26/2022 1958    NO GROWTH 5 DAYS Performed at [facility]  [address] [place], [place] [address]    REPTSTATUS 03/31/2022 FINAL 03/26/2022 1958   REPTSTATUS 03/31/2022 FINAL 03/26/2022 1958    Cardiac Enzymes: No results for input(s): "CKTOTAL", "CKMB", "CKMBINDEX", "TROPONINI" in the last 168 hours. CBG: No results for input(s): "GLUCAP" in the last 168 hours. Iron Studies: No results for input(s): "IRON", "TIBC", "TRANSFERRIN", "FERRITIN" in the last 72 hours. @lablastinr3 @ Studies/Results: No results found. Medications:  anticoagulant sodium citrate     ferric gluconate (FERRLECIT) IVPB 250 mg (05/24/22 1037)   furosemide      [START ON 05/25/2022] amiodarone  200 mg Oral Daily   apixaban  5 mg Oral BID   Chlorhexidine Gluconate Cloth  6 each Topical Daily   etomidate  15 mg Intravenous Once   midodrine  10 mg Oral TID WC     Dialysis Orders:  Unit: [facility] TTS 4:15 425/A1.5x 180 kg 3K/2.5Ca TDC  - Heparin: None/HIT Ab POSITIVE - Mircera 120 q 4 wks (last 10/12) - Hectorol 6 TIW, Sensipar 30 TIW   Assessment/Plan: AFlutter/RVR - s/p cardioversion. On amiodarone. Per primary/cardiology.  Respiratory distress - required BiPAP after cardioversion. Remains on 6L [place]. Try to get volume down with extra UF tomorrow. ESRD -  HD TTS. Extra treatment 05/23/2022. HD 05/25/2022 pm schedule.  BP/Volume  - Chronic volume overload. Tolerates 4-5L UF goals. Midodrine 10 TID for BP support. Torsemide 100 non dialysis days.  Anemia  - Hgb stable. On ESA as outpatient. Next dose 06/06/2022. Follow HGB.  Metabolic bone disease -  Continue Forsrenol binder/Sensipar/Hectorol  HFrEF 20-25%  Phillip Kellman H. Geary Rufo NP-C 05/24/2022, 3:30 PM  [employer] [phone]

## 2022-05-24 NOTE — Progress Notes (Addendum)
Rounding Note    Patient Name: Phillip Young Date of Encounter: 05/24/2022  [place] Cardiologist: [name], MD   Subjective   Pt found sitting on edge of bed. No complains. Feels well  Inpatient Medications    Scheduled Meds:  amiodarone  400 mg Oral BID   apixaban  5 mg Oral BID   Chlorhexidine Gluconate Cloth  6 each Topical Daily   etomidate  15 mg Intravenous Once   midodrine  10 mg Oral TID WC   Continuous Infusions:  anticoagulant sodium citrate     ferric gluconate (FERRLECIT) IVPB     furosemide     PRN Meds: acetaminophen **OR** acetaminophen, senna-docusate   Vital Signs    Vitals:   05/23/22 2345 05/23/22 2350 05/24/22 0334 05/24/22 0909  BP:   102/73   Pulse: 96 93 93 85  Resp: (!) 34 20 20 (!) 30  Temp:   97.8 F (36.6 C)   TempSrc:   Oral   SpO2: 100% 100% 94% 97%  Weight:      Height:        Intake/Output Summary (Last 24 hours) at 05/24/2022 0953 Last data filed at 05/24/2022 0400 Gross per 24 hour  Intake 354 ml  Output 500 ml  Net -146 ml      05/23/2022    5:27 AM 05/20/2022   11:59 AM 05/18/2022   11:09 AM  Last 3 Weights  Weight (lbs) 396 lb 6.4 oz 401 lb 401 lb 6.4 oz  Weight (kg) 179.806 kg 181.892 kg 182.074 kg      Telemetry    Sinus rhythm to sinus tachycardia 90-100s - Personally Reviewed  ECG    No new tracing - Personally Reviewed  Physical Exam   GEN: morbidly obese male in no acute distress.   Neck: No JVD -exam difficult Cardiac: RRR, no murmurs, rubs, or gallops.  Respiratory: Clear to auscultation bilaterally. GI: Soft, nontender, non-distended  MS: No deformity. Neuro:  Nonfocal  Psych: Normal affect   Labs    High Sensitivity Troponin:   Recent Labs  Lab 05/22/22 0732 05/22/22 0937  TROPONINIHS 36* 38*     Chemistry Recent Labs  Lab 05/22/22 0732 05/22/22 2058 05/23/22 0550 05/24/22 0315  NA 132* 132* 133* 133*  K 4.5 4.7 3.5 4.5  CL 97*  --  92* 93*  CO2 18*   --  24 22  GLUCOSE 140*  --  133* 106*  BUN 51*  --  42* 60*  CREATININE 11.59*  --  9.39* 11.57*  CALCIUM 9.6  --  9.4 9.1  ALBUMIN  --   --   --  3.4*  GFRNONAA 5*  --  7* 5*  ANIONGAP 17*  --  17* 18*    Lipids No results for input(s): "CHOL", "TRIG", "HDL", "LABVLDL", "LDLCALC", "CHOLHDL" in the last 168 hours.  Hematology Recent Labs  Lab 05/22/22 0732 05/22/22 2058 05/23/22 0550 05/24/22 0315  WBC 12.2*  --  13.9* 11.9*  RBC 3.02*  --  3.09* 2.76*  HGB 10.6* 10.9* 10.9* 9.7*  HCT 30.8* 32.0* 31.4* 27.8*  MCV 102.0*  --  101.6* 100.7*  MCH 35.1*  --  35.3* 35.1*  MCHC 34.4  --  34.7 34.9  RDW 14.6  --  14.5 14.5  PLT 263  --  250 227   Thyroid No results for input(s): "TSH", "FREET4" in the last 168 hours.  BNP Recent Labs  Lab 05/22/22 0732  BNP 373.1*    DDimer No results for input(s): "DDIMER" in the last 168 hours.   Radiology    CT Angio Chest PE W and/or Wo Contrast  Result Date: 05/22/2022 CLINICAL DATA:  Chest pain and shortness of breath. Evaluate for pulmonary embolism. EXAM: CT ANGIOGRAPHY CHEST WITH CONTRAST TECHNIQUE: Multidetector CT imaging of the chest was performed using the standard protocol during bolus administration of intravenous contrast. Multiplanar CT image reconstructions and MIPs were obtained to evaluate the vascular anatomy. RADIATION DOSE REDUCTION: This exam was performed according to the departmental dose-optimization program which includes automated exposure control, adjustment of the mA and/or kV according to patient size and/or use of iterative reconstruction technique. CONTRAST:  41mL OMNIPAQUE IOHEXOL 350 MG/ML SOLN COMPARISON:  August 18, 2021 CT of the chest. Chest x-ray May 22, 2022. FINDINGS: Cardiovascular: The thoracic aorta is normal in caliber. No aneurysm or dissection identified. No atherosclerotic change. Cardiomegaly is stable. No pulmonary artery dilatation identified. The attenuation of the opacified pulmonary  artery is 245 Hounsfield units demonstrating a moderate quality study. The main and lobar pulmonary arteries are well opacified. Evaluation at the level of the segmental pulmonary arteries and distal is limited due to artifact. Within this limitation, no pulmonary emboli are identified. Mediastinum/Nodes: Mediastinal adenopathy remains a right paratracheal node on series 8, image 44 measures 2 cm in diameter today, stable. An AP window node on series 8, image 48 measures 2.4 cm today versus 2.2 cm on the August 18, 2021 study and 2.2 cm on the October 09, 2020 study. Bilateral hilar adenopathy is similar in the interval. Overall, the adenopathy is stable to slightly more prominent in the interval. The thyroid and esophagus are normal. No pleural effusions or pericardial effusions noted. Bilateral gynecomastia is identified. No other chest wall abnormalities are noted. Lungs/Pleura: Evaluation of the lungs is limited due to patient body habitus. Diffuse ground-glass opacity is identified in the lungs with a few regions of decreased attenuation, likely air trapping. A calcified nodules identified in the left lung base, of no significance. No suspicious pulmonary nodules or masses are identified. Upper Abdomen: No acute abnormality. Musculoskeletal: No chest wall abnormality. No acute or significant osseous findings. Review of the MIP images confirms the above findings. IMPRESSION: 1. No pulmonary emboli identified in the main or lobar pulmonary arteries. Evaluation at and beyond the segmental level is limited. 2. Cardiomegaly. 3. Diffuse ground-glass throughout the lungs suggest pulmonary venous congestion. Regions of decreased attenuation suggests air trapping as well. Recommend clinical correlation. 4. Adenopathy in the mediastinum and hila is stable to mildly more prominent the interval. This finding is nonspecific but has been present since February 19, 2020. Electronically Signed   By: Dorise Bullion III M.D.   On:  05/22/2022 11:07    Cardiac Studies   Echo 03/24/22:  1. Left ventricular ejection fraction, by estimation, is 20 to 25%. The  left ventricle has severely decreased function. The left ventricle  demonstrates global hypokinesis. The left ventricular internal cavity size  was moderately dilated. Left  ventricular diastolic parameters were normal.   2. Right ventricular systolic function is normal. The right ventricular  size is mildly enlarged. There is severely elevated pulmonary artery  systolic pressure. The estimated right ventricular systolic pressure is  86.3 mmHg.   3. Left atrial size was mildly dilated.   4. Right atrial size was mildly dilated.   5. The mitral valve is normal in structure. Mild to moderate mitral valve  regurgitation. No  evidence of mitral stenosis.   6. The aortic valve is normal in structure. Aortic valve regurgitation is  not visualized. No aortic stenosis is present.   7. The inferior vena cava is normal in size with greater than 50%  respiratory variability, suggesting right atrial pressure of 3 mmHg.   Patient Profile     36 y.o. male  with a history of HFrEF, Super morbid obesity, and ESRD.  Planned for HD today with hypoxic respiratory failure (chief complaint). Has developed AFL RVR and associated hypotension planned for urgent cardioversion.  Assessment & Plan    Paroxysmal atrial flutter with RVR S/P urgent DCCV in ER Pt now maintaining sinus rhythm He has refused amiodarone since admission He has history of elevated LFTs in the past - unclear if this was due to amiodarone, but was taken off of amiodarone in the past - given his renal disease, he does not have many options for antiarrhythmic therapy - In review of his labs, he has normal LFTs - he is agreeable to restart low dose amiodarone 200 mg to keep him in rhythm - will need close follow up with labs - can be taken at HD   Need for chronic anticoagulation This patients CHA2DS2-VASc  Score and unadjusted Ischemic Stroke Rate (% per year) is equal to 0.6 % stroke rate/year from a score of 1 (CHF) - will need at least 4 weeks of DOAC post DCCV - doing well on eliquis, but Hb 9.7 (10.9) - could be contributing to his dyspnea   NICM Chronic systolic heart failure ESRD on HD Hypotension  - has had multiple hospitalizations for heart failure - on HD for volume control - complicated by hypotension requiring midodrine   Super morbid obesity OSA on CPAP - BMI likely precludes ICD, ablation      For questions or updates, please contact [employer] Please consult [email or web address] for contact info under        Signed, Ledora Bottcher, PA  05/24/2022, 9:53 AM    Pt seen and examined .   I agree with findings as noted by A [facility] above Pt is walking in room   Feels SOB   Says his oxygen goes down when he moves    Has nasal cannula over mouth, says it is easier to breathe that way.  On exam, Neck is full Lungs are CTA  Cardiac RRR  No S3     Abd  Benign Ext with Tr edema  HFrEF    Volume difficult to assess given body habitus   But he is very SOB Should see if he can have more volume removed with dialysis tomorrow  May be difficult with BP  Hold on med changes for now    AFib  On amiodarone    Maintaining SR  Blood pressure   Pt on tid midodrine   BP is better   Will continue to follow   Dorris Carnes MD

## 2022-05-24 NOTE — Progress Notes (Signed)
HD#2 Subjective:   Summary:   Phillip Young is a 36 yo male with history of acute on chronic respiratory failure with hypoxia, HFrEF, OSA on BIPAP, morbid obesity, ESRD on HD presented with shortness of breath, lightheadedness, and palpitations admitted with acute on chronic respiratory failure with hypoxia.   Overnight Events: No overnight events  Pt continue to be hypoxic and tachypneic with minimal physical activity including moving in and out of bed, walking to the bathroom and when talking. On 6L via [place]. Pt reports worsening SOB with physical activity. Denies chest pain or palpitations. Pt remains in sinus rhythm since he is been cardioverted. SOB likely from combination of volume overload from ESRD, HFrEF, tachyarrhythmias, obesity hypoventilation syndrome, iron deficiency anemia.     Objective:  Vital signs in last 24 hours: Vitals:   05/24/22 0909 05/24/22 0935 05/24/22 1035 05/24/22 1239  BP:  (!) 139/97 115/73 102/63  Pulse: 85 (!) 101 92 85  Resp: (!) 30 (!) 24 (!) 22 20  Temp:  97.7 F (36.5 C) 97.9 F (36.6 C)   TempSrc:  Oral Oral   SpO2: 97% 96% 99% 99%  Weight:      Height:       Supplemental O2: Nasal Cannula 6L SpO2: 99 % O2 Flow Rate (L/min): 4 L/min FiO2 (%): 40 %   Physical Exam:   Physical Exam Vitals and nursing note reviewed.  Constitutional:      General: He is not in acute distress.    Appearance: He is well-developed. He is obese. He is not ill-appearing, toxic-appearing or diaphoretic.  HENT:     Head: Normocephalic and atraumatic.  Cardiovascular:     Rate and Rhythm: Regular rhythm. Tachycardia present.     Heart sounds: No murmur heard. Pulmonary:     Effort: Tachypnea present. No accessory muscle usage or respiratory distress.     Breath sounds: No wheezing, rhonchi or rales.  Skin:    General: Skin is warm.  Neurological:     Mental Status: He is alert and oriented to person, place, and time.  Psychiatric:        Mood and  Affect: Mood is anxious.        Behavior: Behavior normal.      Filed Weights   05/23/22 0527  Weight: (!) 179.8 kg     Intake/Output Summary (Last 24 hours) at 05/24/2022 1339 Last data filed at 05/24/2022 0400 Gross per 24 hour  Intake 354 ml  Output 500 ml  Net -146 ml   Net IO Since Admission: -2,646 mL [05/24/22 1339]  Pertinent Labs:    Latest Ref Rng & Units 05/24/2022    3:15 AM 05/23/2022    5:50 AM 05/22/2022    8:58 PM  CBC  WBC 4.0 - 10.5 K/uL 11.9  13.9    Hemoglobin 13.0 - 17.0 g/dL 9.7  10.9  10.9   Hematocrit 39.0 - 52.0 % 27.8  31.4  32.0   Platelets 150 - 400 K/uL 227  250         Latest Ref Rng & Units 05/24/2022    3:15 AM 05/23/2022    5:50 AM 05/22/2022    8:58 PM  CMP  Glucose 70 - 99 mg/dL 106  133    BUN 6 - 20 mg/dL 60  42    Creatinine 0.61 - 1.24 mg/dL 11.57  9.39    Sodium 135 - 145 mmol/L 133  133  132   Potassium  3.5 - 5.1 mmol/L 4.5  3.5  4.7   Chloride 98 - 111 mmol/L 93  92    CO2 22 - 32 mmol/L 22  24    Calcium 8.9 - 10.3 mg/dL 9.1  9.4      Imaging: No results found.  Assessment/Plan:   Principal Problem:   Acute hypoxic respiratory failure ([name]) Active Problems:   OSA (obstructive sleep apnea)   Chronic systolic CHF (congestive heart failure) ([name])   Paroxysmal atrial flutter ([name])   ESRD (end stage renal disease) on dialysis ([place])   Hypotension   Patient Summary:  Phillip Young is a Phillip Young is a 36 yo male with history of acute on chronic respiratory failure with hypoxia, HFrEF, OSA on BIPAP, morbid obesity, ESRD on HD presented with shortness of breath, lightheadedness, and palpitations admitted with acute on chronic respiratory failure with hypoxia.   Assessment & Plan by Problem: Principal Problem:   Acute hypoxic respiratory failure ([name])   1) Acute on chronic hypoxic respiratory failure  Pt presented to ED with worsening SOB, palpitations, lightheadedness and fatigue. Pt have severely reduced  Left ventricular ejection fraction 20 to 25% (Echo in 03/24/2022).  Respiratory panel was negative and CT angio chest  r/o PE and infectious cause. Diffuse ground-glass throughout the lungs suggest pulmonary venous congestion.    Acute hypoxic respiratory failure could be 2/2 Obesity hypoventilation syndrome, Acute or chronic CHF LV systolic dysfunction, Afib/A Flutter, volume overload from ESRD,or iron deficiency anemia. Pt had HD with 2.5 L removed.  Pt is scheduled hemodialysis today for additional UF.  Pt is hemodynamically stable, tachypneic and hypoxic with slight movement with obvious desaturation, on O2 -6L via [place].    - Continue BIPAP at night -Volume management by dialysis -scheduled for today -Midodrine 10 TID for BP support. Torsemide 100 non dialysis days- as per Nephrology recommendations. -Repeat BMP     2) Atrial flutter and hypotension s/p Cardioversion Arrhythmia set off likely due to obesity hypoventilation syndrome, Hypoxemia, Afib,  anemia, CHF. Pt remains in sinus rhythm post cardioversion. Pt hemodynamically stable. Denies CP, palpations.   CHA2DS2-VASc score : 2 indicating need for anticoagulation. -Cardiology consulted, appreciate recommendations: -Continue Amiodarone (pt declined to take the medicine. Pt was educated the importance of taking the medicine to help with rhythm control and subsequently help with worsening of the SOB).  -Re add Midodrine -continue Eliquis -Continue BIPAP   3) ESRD on HD  Pt had dialysis last night . 2.5 L removed. Pt will be dialyzed today. -Volume control via HD while in the hospital.     4) Iron deficiency Anemia Iron studies done in 03/2022 reflected iron deficiency anemia ( Iron 27 and Iron saturation 8). -Iron transfusion- Ferrlecit 250 mg x 2 doses -Outpatient colonoscopy  --Repeat CBC    5) OSA on BIPAP  Continue BIPAP as needed and every night.     Best practices Code: FULL Diet: NPO IVF: None    Dispo: Anticipated  discharge to Home in 2 days pending Improvement in respiratory status   Teola Bradley, MD Internal Medicine Resident PGY-1 Pager: [phone] Please contact the on call pager after 5 pm and on weekends at [phone].

## 2022-05-24 NOTE — Progress Notes (Signed)
Patient stated he would place himself on the BiPAP when he was ready and didn't need assistance.

## 2022-05-25 DIAGNOSIS — I132 Hypertensive heart and chronic kidney disease with heart failure and with stage 5 chronic kidney disease, or end stage renal disease: Secondary | ICD-10-CM

## 2022-05-25 DIAGNOSIS — Z992 Dependence on renal dialysis: Secondary | ICD-10-CM | POA: Diagnosis not present

## 2022-05-25 DIAGNOSIS — J9621 Acute and chronic respiratory failure with hypoxia: Secondary | ICD-10-CM

## 2022-05-25 DIAGNOSIS — N186 End stage renal disease: Secondary | ICD-10-CM | POA: Diagnosis not present

## 2022-05-25 DIAGNOSIS — I5020 Unspecified systolic (congestive) heart failure: Secondary | ICD-10-CM

## 2022-05-25 DIAGNOSIS — J9601 Acute respiratory failure with hypoxia: Secondary | ICD-10-CM | POA: Diagnosis not present

## 2022-05-25 DIAGNOSIS — D509 Iron deficiency anemia, unspecified: Secondary | ICD-10-CM

## 2022-05-25 DIAGNOSIS — E1122 Type 2 diabetes mellitus with diabetic chronic kidney disease: Secondary | ICD-10-CM | POA: Diagnosis not present

## 2022-05-25 LAB — RENAL FUNCTION PANEL
Albumin: 3.4 g/dL — ABNORMAL LOW (ref 3.5–5.0)
Anion gap: 20 — ABNORMAL HIGH (ref 5–15)
BUN: 74 mg/dL — ABNORMAL HIGH (ref 6–20)
CO2: 18 mmol/L — ABNORMAL LOW (ref 22–32)
Calcium: 9 mg/dL (ref 8.9–10.3)
Chloride: 96 mmol/L — ABNORMAL LOW (ref 98–111)
Creatinine, Ser: 12.58 mg/dL — ABNORMAL HIGH (ref 0.61–1.24)
GFR, Estimated: 5 mL/min — ABNORMAL LOW (ref >60)
Glucose, Bld: 100 mg/dL — ABNORMAL HIGH (ref 70–99)
Phosphorus: 7.3 mg/dL — ABNORMAL HIGH (ref 2.5–4.6)
Potassium: 4.6 mmol/L (ref 3.5–5.1)
Sodium: 134 mmol/L — ABNORMAL LOW (ref 135–145)

## 2022-05-25 LAB — CBC
HCT: 27.7 % — ABNORMAL LOW (ref 39.0–52.0)
Hemoglobin: 9.6 g/dL — ABNORMAL LOW (ref 13.0–17.0)
MCH: 34.8 pg — ABNORMAL HIGH (ref 26.0–34.0)
MCHC: 34.7 g/dL (ref 30.0–36.0)
MCV: 100.4 fL — ABNORMAL HIGH (ref 80.0–100.0)
Platelets: 230 10*3/uL (ref 150–400)
RBC: 2.76 MIL/uL — ABNORMAL LOW (ref 4.22–5.81)
RDW: 14.6 % (ref 11.5–15.5)
WBC: 12.1 10*3/uL — ABNORMAL HIGH (ref 4.0–10.5)
nRBC: 0 % (ref 0.0–0.2)

## 2022-05-25 MED ORDER — HEPARIN SODIUM (PORCINE) 1000 UNIT/ML IJ SOLN
INTRAMUSCULAR | Status: AC
  Filled 2022-05-25: qty 4

## 2022-05-25 MED ORDER — LANTHANUM CARBONATE 500 MG PO CHEW
2000.0000 mg | CHEWABLE_TABLET | Freq: Three times a day (TID) | ORAL | Status: DC
  Administered 2022-05-25 – 2022-06-10 (×24): 2000 mg via ORAL
  Filled 2022-05-25 (×30): qty 4

## 2022-05-25 MED ORDER — TORSEMIDE 20 MG PO TABS
100.0000 mg | ORAL_TABLET | ORAL | Status: DC
  Administered 2022-05-28 – 2022-06-06 (×6): 100 mg via ORAL
  Filled 2022-05-25 (×2): qty 5
  Filled 2022-05-25: qty 1
  Filled 2022-05-25 (×8): qty 5

## 2022-05-25 NOTE — Progress Notes (Signed)
RT called by patient's primary RN at [facility] stating that HD had called stating that patient needed his bipap machine for dialysis treatment but was not in respiratory distress.  RT told RN that RT would take the machine once RT checked on a trach patient on the floor.  At 0900, RT was told that HD was bringing the patient back to his room due to the bipap not being present.  RT called HD at 0903 to clarify that the patient was not in respiratory distress.  Per HD RN patient wasn't in distress but was stating to them that he was refusing treatment unless he had his bipap.  RT stated to HD RN that the machine was being brought up as we spoke.  RT arrived at HD 0910.  Patient placed himself on bipap, with little RT assistance, and is tolerating well with 4LNC on standby per patient request.

## 2022-05-25 NOTE — Progress Notes (Signed)
Received patient in bed to unit.  Alert and oriented.  Informed consent signed and in chart.   Treatment initiated: 0923 Treatment completed: 1334  Patient tolerated well.  Transported back to the room  Alert, without acute distress.  Hand-off given to patient's nurse.   Access used: RIJ Access issues: No  Total UF removed: 3400 Medication(s) given: Ferric Gluconate  Post HD VS: b/p-104/75, hr-88, r-19, t-98.3, o2-100% on bipap Post HD weight: 180.3kgs   [facility]

## 2022-05-25 NOTE — Progress Notes (Signed)
Subjective Paged to bedside for lightheadedness.  Patient states that he gets lightheaded when he sits up or stands.  Not currently experiencing symptoms though.  Also endorses stomach cramps when he sits up or stands.  Reports that his symptoms began once he started amiodarone recently.  Symptoms are relieved when he is supine.  States his lightheadedness has been going on since Friday.  Denies chest pains.  Denies specifically vertiginous symptoms.  Objective Today's Vitals   05/25/22 1354 05/25/22 1500 05/25/22 1526 05/25/22 1725  BP:  111/68 111/78 (!) 128/93  Pulse:  92 98   Resp:  20 18 20   Temp:  98.4 F (36.9 C) 98.2 F (36.8 C)   TempSrc:  Oral Oral   SpO2:  96% 100%   Weight: (!) 180.3 kg     Height:      PainSc:   0-No pain    Body mass index is 53.91 kg/m.  General: Morbidly obese male lying in hospital bed, no acute distress Cardiac: RRR, no m/r/g Pulm: CTAB, mechanical breath sounds on BiPAP, no accessory muscle use GI: Soft, nontender, distended Neuro: A&O x3, CN II through XII grossly intact, strength and sensation intact at upper and lower extremities bilaterally Skin: Normal skin turgor  Assessment Patient found resting comfortably in bed on BiPAP.  Vital signs within normal limits aside from BiPAP requirement, though satting at 100% currently.  Do not feel his lightheadedness is related to arrhythmia, he is in NSR.  Do not suspect stroke/TIA given neuro exam nonfocal and symptoms positional.  Patient appears hypervolemic, do not suspect orthostasis.  He did have dialysis today with UF but blood pressure is stable and he is not tachycardic.  Normal skin turgor.  He is not syncopal.  Oxygenating well with BiPAP, do not suspect hypoxia as culprit.  Given concurrent abdominal symptoms, feel vasovagal etiology most likely.  He is predisposed to this given his respiratory status.  Plan Patient's symptoms seem to have resolved, but if his positional lightheadedness  continues could consider orthostatic vitals.  Otherwise, continue to treat volume overload, A-fib, and replace electrolytes as needed.

## 2022-05-25 NOTE — Progress Notes (Addendum)
[facility] Progress Note   Subjective: Seen prior to HD. Says he requires BIPAP to lie in bed, refusing to start HD. RT unavailable to set up BIPAP. Sent back to his room.   Objective Vitals:   05/24/22 2135 05/25/22 0100 05/25/22 0701 05/25/22 0710  BP: 123/72 (!) 126/107    Pulse: 88 88 95 92  Resp:  (!) 28    Temp: 98.4 F (36.9 C) 98 F (36.7 C)    TempSrc: Oral Oral    SpO2: 100% 100% (!) 89% 96%  Weight:      Height:       Physical Exam General: Obese male in NAD Heart: HS distant D/T body habitus. S1,S2 RRR SR on monitor Lungs: CTAB slightly decreased in bases Abdomen: Obese, NABS Extremities:No LE edema Dialysis Access: [facility] drsg intact   Additional Objective Labs: Basic Metabolic Panel: Recent Labs  Lab 05/23/22 0550 05/24/22 0315 05/25/22 0434  NA 133* 133* 134*  K 3.5 4.5 4.6  CL 92* 93* 96*  CO2 24 22 18*  GLUCOSE 133* 106* 100*  BUN 42* 60* 74*  CREATININE 9.39* 11.57* 12.58*  CALCIUM 9.4 9.1 9.0  PHOS  --  6.6* 7.3*   Liver Function Tests: Recent Labs  Lab 05/24/22 0315 05/25/22 0434  ALBUMIN 3.4* 3.4*   No results for input(s): "LIPASE", "AMYLASE" in the last 168 hours. CBC: Recent Labs  Lab 05/22/22 0732 05/22/22 2058 05/23/22 0550 05/24/22 0315 05/25/22 0434  WBC 12.2*  --  13.9* 11.9* 12.1*  HGB 10.6*   < > 10.9* 9.7* 9.6*  HCT 30.8*   < > 31.4* 27.8* 27.7*  MCV 102.0*  --  101.6* 100.7* 100.4*  PLT 263  --  250 227 230   < > = values in this interval not displayed.   Blood Culture    Component Value Date/Time   SDES BLOOD LEFT HAND 03/26/2022 1958   SDES BLOOD RIGHT HAND 03/26/2022 1958   SPECREQUEST  03/26/2022 1958    BOTTLES DRAWN AEROBIC ONLY Blood Culture adequate volume   SPECREQUEST  03/26/2022 1958    BOTTLES DRAWN AEROBIC ONLY Blood Culture adequate volume   CULT  03/26/2022 1958    NO GROWTH 5 DAYS Performed at [facility], [place] [address] [place], [place] [address]    CULT   03/26/2022 1958    NO GROWTH 5 DAYS Performed at [facility] [address] [place], [place] [address]    REPTSTATUS 03/31/2022 FINAL 03/26/2022 1958   REPTSTATUS 03/31/2022 FINAL 03/26/2022 1958    Cardiac Enzymes: No results for input(s): "CKTOTAL", "CKMB", "CKMBINDEX", "TROPONINI" in the last 168 hours. CBG: No results for input(s): "GLUCAP" in the last 168 hours. Iron Studies: No results for input(s): "IRON", "TIBC", "TRANSFERRIN", "FERRITIN" in the last 72 hours. @lablastinr3 @ Studies/Results: No results found. Medications:  anticoagulant sodium citrate     ferric gluconate (FERRLECIT) IVPB Stopped (05/24/22 1240)   furosemide      amiodarone  200 mg Oral Daily   apixaban  5 mg Oral BID   Chlorhexidine Gluconate Cloth  6 each Topical Daily   etomidate  15 mg Intravenous Once   midodrine  10 mg Oral TID WC     Dialysis Orders:  Unit: [facility] TTS 4:15 425/A1.5x 180 kg 3K/2.5Ca TDC  - Heparin: None/HIT Ab POSITIVE - Mircera 120 q 4 wks (last 10/12) - Hectorol 6 TIW, Sensipar 30 TIW   Assessment/Plan: AFlutter/RVR - s/p cardioversion. On amiodarone.  Per primary/cardiology.  Respiratory distress - required BiPAP after cardioversion. Remains on 6L [place]. Try to get volume down with extra UF tomorrow. ESRD -  HD TTS. Extra treatment 05/23/2022. HD 05/25/2022 per schedule however he is refusing HD unless he has BIPAP. Will have HD sometime today when he chooses to cooperate.  BP/Volume  - Chronic volume overload. Tolerates 4-5L UF goals. Midodrine 10 TID for BP support. Torsemide 100 non dialysis days.  Anemia  - Hgb stable. On ESA as outpatient. Next dose 06/06/2022. Follow HGB.  Metabolic bone disease -  Continue Forsrenol binder/Sensipar/Hectorol  HFrEF 20-25%  Jarel Cuadra H. Yuliza Cara NP-C 05/25/2022, 8:42 AM  [employer] [phone]

## 2022-05-25 NOTE — Progress Notes (Signed)
Rounding Note    Patient Name: Phillip Young Date of Encounter: 05/25/2022  [place] Cardiologist: [name], MD   Subjective   PT in bed with BiPAP   COmfortable  No CP      Scheduled Meds:  amiodarone  200 mg Oral Daily   apixaban  5 mg Oral BID   Chlorhexidine Gluconate Cloth  6 each Topical Daily   etomidate  15 mg Intravenous Once   midodrine  10 mg Oral TID WC   Continuous Infusions:  anticoagulant sodium citrate     ferric gluconate (FERRLECIT) IVPB Stopped (05/24/22 1240)   furosemide     PRN Meds: acetaminophen **OR** acetaminophen, senna-docusate   Vital Signs    Vitals:   05/25/22 0100 05/25/22 0701 05/25/22 0710 05/25/22 0848  BP: (!) 126/107   113/73  Pulse: 88 95 92 96  Resp: (!) 28   (!) 22  Temp: 98 F (36.7 C)   98.1 F (36.7 C)  TempSrc: Oral   Oral  SpO2: 100% (!) 89% 96% 91%  Weight:    (!) 191.5 kg  Height:        Intake/Output Summary (Last 24 hours) at 05/25/2022 0857 Last data filed at 05/24/2022 1240 Gross per 24 hour  Intake 250 ml  Output --  Net 250 ml      05/25/2022    8:48 AM 05/23/2022    5:27 AM 05/20/2022   11:59 AM  Last 3 Weights  Weight (lbs) 422 lb 2.9 oz 396 lb 6.4 oz 401 lb  Weight (kg) 191.5 kg 179.806 kg 181.892 kg      Telemetry    SR 90s - Personally Reviewed  ECG    No new tracing - Personally Reviewed  Physical Exam   GEN: morbidly obese male in no acute distress.   Neck Neck is full Cardiac: RRR, no murmur Respiratory: Clear to auscultation bilaterally.  MIld rhonchi L base   GI: Soft, nontender, non-distended  MS: No deformity. Neuro:  Nonfocal  Psych: Normal affect   Labs    High Sensitivity Troponin:   Recent Labs  Lab 05/22/22 0732 05/22/22 0937  TROPONINIHS 36* 38*     Chemistry Recent Labs  Lab 05/23/22 0550 05/24/22 0315 05/25/22 0434  NA 133* 133* 134*  K 3.5 4.5 4.6  CL 92* 93* 96*  CO2 24 22 18*  GLUCOSE 133* 106* 100*  BUN 42* 60* 74*   CREATININE 9.39* 11.57* 12.58*  CALCIUM 9.4 9.1 9.0  ALBUMIN  --  3.4* 3.4*  GFRNONAA 7* 5* 5*  ANIONGAP 17* 18* 20*    Lipids No results for input(s): "CHOL", "TRIG", "HDL", "LABVLDL", "LDLCALC", "CHOLHDL" in the last 168 hours.  Hematology Recent Labs  Lab 05/23/22 0550 05/24/22 0315 05/25/22 0434  WBC 13.9* 11.9* 12.1*  RBC 3.09* 2.76* 2.76*  HGB 10.9* 9.7* 9.6*  HCT 31.4* 27.8* 27.7*  MCV 101.6* 100.7* 100.4*  MCH 35.3* 35.1* 34.8*  MCHC 34.7 34.9 34.7  RDW 14.5 14.5 14.6  PLT 250 227 230   Thyroid No results for input(s): "TSH", "FREET4" in the last 168 hours.  BNP Recent Labs  Lab 05/22/22 0732  BNP 373.1*    DDimer No results for input(s): "DDIMER" in the last 168 hours.   Radiology    No results found.  Cardiac Studies   Echo 03/24/22:  1. Left ventricular ejection fraction, by estimation, is 20 to 25%. The  left ventricle has severely decreased function. The  left ventricle  demonstrates global hypokinesis. The left ventricular internal cavity size  was moderately dilated. Left  ventricular diastolic parameters were normal.   2. Right ventricular systolic function is normal. The right ventricular  size is mildly enlarged. There is severely elevated pulmonary artery  systolic pressure. The estimated right ventricular systolic pressure is  56.8 mmHg.   3. Left atrial size was mildly dilated.   4. Right atrial size was mildly dilated.   5. The mitral valve is normal in structure. Mild to moderate mitral valve  regurgitation. No evidence of mitral stenosis.   6. The aortic valve is normal in structure. Aortic valve regurgitation is  not visualized. No aortic stenosis is present.   7. The inferior vena cava is normal in size with greater than 50%  respiratory variability, suggesting right atrial pressure of 3 mmHg.   Patient Profile     36 y.o. male  with a history of HFrEF, Super morbid obesity, and ESRD.  Planned for HD today with hypoxic respiratory  failure (chief complaint). Has developed AFL RVR and associated hypotension planned for urgent cardioversion.  Assessment & Plan    Paroxysmal atrial flutter with RVR S/P urgent DCCV in ER Pt now maintaining sinus rhythm on amiodarone   WIll need to follow LFTs closely   Elevated in past when on amiodarone   NOt clear if due to amiodarone but it was stopped   CHADSVASc is 1 (CHF)    WIll be on DOAC for 1 month    HFrEF    Nonischemic   Volume status maintained by dialysis      Yesterday he was quite SOB with standing, moving   Needs more removal Exam is limited given size Use of GDMT limited by hypotension      ESRD on HD For dialysis today      Hypotension  - has had multiple hospitalizations for heart failure - on HD for volume control - complicated by hypotension.  Maintained on midodrine     Super morbid obesity Insurance will not cover ozempic  OSA Pt comfortable using BiPAP      For questions or updates, please contact [facility] Please consult [email or web address] for contact info under        Signed, Dorris Carnes, MD  05/25/2022, 8:57 AM

## 2022-05-25 NOTE — Progress Notes (Signed)
PT Cancellation Note  Patient Details Name: Phillip Young MRN: [id number] DOB: 12/23/1985   Cancelled Treatment:    Reason Eval/Treat Not Completed: Patient at procedure or test/unavailable (HD)   [name] [name] 05/25/2022, 9:19 AM [place] Office: [phone]

## 2022-05-25 NOTE — Progress Notes (Signed)
HD#2 Subjective:   Summary:   Phillip Young is a 36 yo male with history of acute on chronic respiratory failure with hypoxia, HFrEF, OSA on BIPAP, morbid obesity, ESRD on HD presented with shortness of breath, lightheadedness, and palpitations admitted with acute on chronic respiratory failure with hypoxia.   Overnight Events: No overnight events  Subjective: Still feeling short of breath today. He is complaining of feeling short of breath when he moves. Also reports tightness in his abd which he moves. No other complaints at this time.   Objective:  Vital signs in last 24 hours: Vitals:   05/25/22 1230 05/25/22 1300 05/25/22 1334 05/25/22 1354  BP: 113/82  104/75   Pulse: 86 79 85   Resp: (!) 24 (!) 29 (!) 22   Temp:   98.3 F (36.8 C)   TempSrc:   Axillary   SpO2: 100% 100% 100%   Weight:    (!) 180.3 kg  Height:       Supplemental O2: Nasal Cannula 5L SpO2: 100 % O2 Flow Rate (L/min): 5 L/min FiO2 (%): 40 %   Physical Exam:   Constitutional:      General: He is not in acute distress.    Appearance: He is well-developed. He is obese. He is not ill-appearing, toxic-appearing or diaphoretic.  HENT:     Head: Normocephalic and atraumatic.  Cardiovascular:     Rate and Rhythm: Regular rhythm. Tachycardia present.     Heart sounds: No murmur heard. No LEE Pulmonary:     Effort: Tachypnea present. No accessory muscle usage or respiratory distress.     Breath sounds: No wheezing, rhonchi or rales.  Skin:    General: Skin is warm.  Neurological:     Mental Status: no focal deficits  Assessment/Plan:   Principal Problem:   Acute hypoxic respiratory failure ([name]) Active Problems:   OSA (obstructive sleep apnea)   Chronic systolic CHF (congestive heart failure) ([name])   Paroxysmal atrial flutter ([name])   ESRD (end stage renal disease) on dialysis ([place])   Hypotension   Patient Summary:  Phillip Young is a Phillip Young is a 36 yo male with history of  acute on chronic respiratory failure with hypoxia, HFrEF, OSA on BIPAP, morbid obesity, ESRD on HD presented with shortness of breath, lightheadedness, and palpitations admitted with acute on chronic respiratory failure with hypoxia.   Assessment & Plan by Problem: Principal Problem:   Acute hypoxic respiratory failure ([name])   # Acute on chronic hypoxic respiratory failure Due to volume overload, though obesity hypoventilation and his afib may be contributing factors. He remains volume overloaded, still requiring 5L Phillip Young and was on BiPAP while in HD. He did not receive HD yesterday. Nephrology is assisting with plans to remove 3L volume today.  - BiPAP at night - volume mgmt via dialysis and torsemide on non-hd days - daily bmp  # HFrEF with EF 20-25% 03/24/22 - nonischemic Volume overloaded still. Exam limited by body habitus. GDMT limited due to hypotension. Pt Requires midodrine on HD days. Cardiology assisting.   # Atrial flutter and hypotension s/p Cardioversion Arrhythmia set off likely due to obesity hypoventilation syndrome, Hypoxemia, Afib,  anemia, CHF. Pt remains in sinus rhythm post cardioversion. Pt hemodynamically stable. Denies CP, palpations.   CHA2DS2-VASc score : 2 indicating need for anticoagulation. - Continue amiodarone -continue Eliquis -Continue BIPAP   # ESRD on HD Did not receive HD yesterday. His weight is up today with worsening BUN and phosphate.  Receiving HD today with plan for 3L volume removal. -Volume control via HD while in the hospital.  - midodrine with HD - torsemide non-hd days   #  Iron deficiency Anemia Iron studies done in 03/2022 reflected iron deficiency anemia ( Iron 27 and Iron saturation 8). Likely also has component related to his ESRD - Iron transfusion- Ferrlecit 250 mg x 2 doses - Outpatient colonoscopy  - daily CBC    #  OSA on BIPAP  Continue BIPAP as needed and every night.     Best practices Code: FULL Diet: NPO IVF:  None Dispo: Anticipated discharge to Home in 2 days pending Improvement in respiratory status  Delene Ruffini, MD

## 2022-05-26 ENCOUNTER — Inpatient Hospital Stay (HOSPITAL_COMMUNITY): Payer: BC Managed Care – PPO

## 2022-05-26 DIAGNOSIS — Z992 Dependence on renal dialysis: Secondary | ICD-10-CM | POA: Diagnosis not present

## 2022-05-26 DIAGNOSIS — J9621 Acute and chronic respiratory failure with hypoxia: Secondary | ICD-10-CM | POA: Diagnosis not present

## 2022-05-26 DIAGNOSIS — I5022 Chronic systolic (congestive) heart failure: Secondary | ICD-10-CM | POA: Diagnosis not present

## 2022-05-26 DIAGNOSIS — J9601 Acute respiratory failure with hypoxia: Secondary | ICD-10-CM | POA: Diagnosis not present

## 2022-05-26 DIAGNOSIS — N186 End stage renal disease: Secondary | ICD-10-CM | POA: Diagnosis not present

## 2022-05-26 LAB — RENAL FUNCTION PANEL
Albumin: 3.5 g/dL (ref 3.5–5.0)
Anion gap: 14 (ref 5–15)
BUN: 48 mg/dL — ABNORMAL HIGH (ref 6–20)
CO2: 25 mmol/L (ref 22–32)
Calcium: 8.9 mg/dL (ref 8.9–10.3)
Chloride: 95 mmol/L — ABNORMAL LOW (ref 98–111)
Creatinine, Ser: 9.37 mg/dL — ABNORMAL HIGH (ref 0.61–1.24)
GFR, Estimated: 7 mL/min — ABNORMAL LOW (ref >60)
Glucose, Bld: 112 mg/dL — ABNORMAL HIGH (ref 70–99)
Phosphorus: 5.2 mg/dL — ABNORMAL HIGH (ref 2.5–4.6)
Potassium: 4.1 mmol/L (ref 3.5–5.1)
Sodium: 134 mmol/L — ABNORMAL LOW (ref 135–145)

## 2022-05-26 LAB — CBC
HCT: 26.4 % — ABNORMAL LOW (ref 39.0–52.0)
Hemoglobin: 9.1 g/dL — ABNORMAL LOW (ref 13.0–17.0)
MCH: 35.3 pg — ABNORMAL HIGH (ref 26.0–34.0)
MCHC: 34.5 g/dL (ref 30.0–36.0)
MCV: 102.3 fL — ABNORMAL HIGH (ref 80.0–100.0)
Platelets: 243 10*3/uL (ref 150–400)
RBC: 2.58 MIL/uL — ABNORMAL LOW (ref 4.22–5.81)
RDW: 14.6 % (ref 11.5–15.5)
WBC: 14 10*3/uL — ABNORMAL HIGH (ref 4.0–10.5)
nRBC: 0 % (ref 0.0–0.2)

## 2022-05-26 LAB — SEDIMENTATION RATE: Sed Rate: 113 mm/hr — ABNORMAL HIGH (ref 0–16)

## 2022-05-26 LAB — MRSA NEXT GEN BY PCR, NASAL: MRSA by PCR Next Gen: NOT DETECTED

## 2022-05-26 MED ORDER — POLYETHYLENE GLYCOL 3350 17 G PO PACK
17.0000 g | PACK | Freq: Every day | ORAL | Status: DC | PRN
  Administered 2022-05-26: 17 g via ORAL
  Filled 2022-05-26: qty 1

## 2022-05-26 NOTE — Progress Notes (Signed)
Dr. Raymondo Band made aware that patient wants to hold off on PO amio and torsemide this morning.

## 2022-05-26 NOTE — Progress Notes (Signed)
[facility] Progress Note   Subjective: Lying flat in bed with BIPAP in place, resting in NAD. HD tomorrow on schedule. Net UF 3 liters with HD 05/25/2022. Continue lowering volume as tolerated.   Objective Vitals:   05/26/22 0245 05/26/22 0438 05/26/22 0500 05/26/22 0842  BP:  109/80  113/88  Pulse: 99 (!) 102    Resp:  20  20  Temp:  98.9 F (37.2 C)  97.9 F (36.6 C)  TempSrc:  Oral  Oral  SpO2: 95% 96%    Weight:   (!) 181.9 kg   Height:       Physical Exam General: Obese male in NAD Heart: HS distant D/T body habitus. S1,S2 RRR SR on monitor Lungs: CTAB slightly decreased in bases Abdomen: Obese, NABS Extremities:No LE edema Dialysis Access: TDC drsg intact    Dialysis Orders:  Additional Objective Labs: Basic Metabolic Panel: Recent Labs  Lab 05/24/22 0315 05/25/22 0434 05/26/22 0419  NA 133* 134* 134*  K 4.5 4.6 4.1  CL 93* 96* 95*  CO2 22 18* 25  GLUCOSE 106* 100* 112*  BUN 60* 74* 48*  CREATININE 11.57* 12.58* 9.37*  CALCIUM 9.1 9.0 8.9  PHOS 6.6* 7.3* 5.2*   Liver Function Tests: Recent Labs  Lab 05/24/22 0315 05/25/22 0434 05/26/22 0419  ALBUMIN 3.4* 3.4* 3.5   No results for input(s): "LIPASE", "AMYLASE" in the last 168 hours. CBC: Recent Labs  Lab 05/22/22 0732 05/22/22 2058 05/23/22 0550 05/24/22 0315 05/25/22 0434 05/26/22 0419  WBC 12.2*  --  13.9* 11.9* 12.1* 14.0*  HGB 10.6*   < > 10.9* 9.7* 9.6* 9.1*  HCT 30.8*   < > 31.4* 27.8* 27.7* 26.4*  MCV 102.0*  --  101.6* 100.7* 100.4* 102.3*  PLT 263  --  250 227 230 243   < > = values in this interval not displayed.   Blood Culture    Component Value Date/Time   SDES BLOOD LEFT HAND 03/26/2022 1958   SDES BLOOD RIGHT HAND 03/26/2022 1958   SPECREQUEST  03/26/2022 1958    BOTTLES DRAWN AEROBIC ONLY Blood Culture adequate volume   SPECREQUEST  03/26/2022 1958    BOTTLES DRAWN AEROBIC ONLY Blood Culture adequate volume   CULT  03/26/2022 1958    NO GROWTH 5  DAYS Performed at [facility], [place] [address], [place], [place] [address]    CULT  03/26/2022 1958    NO GROWTH 5 DAYS Performed at [place] [address], [place], [place] [address]    REPTSTATUS 03/31/2022 FINAL 03/26/2022 1958   REPTSTATUS 03/31/2022 FINAL 03/26/2022 1958    Cardiac Enzymes: No results for input(s): "CKTOTAL", "CKMB", "CKMBINDEX", "TROPONINI" in the last 168 hours. CBG: No results for input(s): "GLUCAP" in the last 168 hours. Iron Studies: No results for input(s): "IRON", "TIBC", "TRANSFERRIN", "FERRITIN" in the last 72 hours. @lablastinr3 @ Studies/Results: DG Chest Port 1 View  Result Date: 05/26/2022 CLINICAL DATA:  sob EXAM: PORTABLE CHEST - 1 VIEW COMPARISON:  05/22/2022 FINDINGS: Stable tunneled right IJ hemodialysis catheter to the proximal RA. Suspect mild central pulmonary vascular congestion, increased since previous. Heart size upper limits normal for technique. No effusion.  Obesity. Visualized bones unremarkable. IMPRESSION: Mild central pulmonary vascular congestion. Electronically Signed   By: Lucrezia Europe M.D.   On: 05/26/2022 10:21   Medications:  anticoagulant sodium citrate     furosemide      amiodarone  200 mg Oral Daily   apixaban  5 mg Oral BID   Chlorhexidine Gluconate Cloth  6 each Topical Daily   etomidate  15 mg Intravenous Once   lanthanum  2,000 mg Oral TID WC   midodrine  10 mg Oral TID WC   torsemide  100 mg Oral Once per day on Sun Mon Wed Fri     Dialysis Orders:  Unit: [facility] 4:15 425/A1.5x 180 kg 3K/2.5Ca TDC  - Heparin: None/HIT Ab POSITIVE - Mircera 120 q 4 wks (last 10/12) - Hectorol 6 TIW, Sensipar 30 TIW   Assessment/Plan: AFlutter/RVR -  Now SR s/p cardioversion. On amiodarone. Per primary/cardiology.  Respiratory distress - required BiPAP after cardioversion. O2 sats fall with ambulation. Still using BIPAP.  Try to get volume down with extra UF tomorrow. ESRD -  HD TTS. BP/Volume  - Chronic  volume overload. Tolerates 4-5L UF goals. Midodrine 10 TID for BP support. Torsemide 100 mg PO non dialysis days.  Anemia  - Hgb stable. On ESA as outpatient. Next dose 06/06/2022. Follow HGB.  Metabolic bone disease -  Continue Forsrenol binder/Sensipar/Hectorol  HFrEF 20-25%    Renard Caperton H. Asher Torpey NP-C 05/26/2022, 11:28 AM  [employer] [phone]

## 2022-05-26 NOTE — Progress Notes (Signed)
PT Cancellation Note  Patient Details Name: Phillip Young MRN: [id number] DOB: 08/14/1985   Cancelled Treatment:    Reason Eval/Treat Not Completed: Patient at procedure or test/unavailable; off the floor for CT scan.  Will attempt another day.   Reginia Naas 05/26/2022, 3:53 PM Magda Kiel, PT Acute Rehabilitation Services Office:[phone] 05/26/2022

## 2022-05-26 NOTE — Progress Notes (Signed)
PT Cancellation Note  Patient Details Name: Phillip Young MRN: [id number] DOB: Jul 13, 1986   Cancelled Treatment:    Reason Eval/Treat Not Completed: Other (comment)  Patient sound asleep on BiPap. Unable to arouse. Will reattempt at a later time.    [place]  Office [phone]   Rexanne Mano 05/26/2022, 10:39 AM

## 2022-05-26 NOTE — Progress Notes (Signed)
Rounding Note    Patient Name: Phillip Young Date of Encounter: 05/26/2022  [place] Cardiologist: [name], MD   Subjective  Pt still SOB with Standing / moving   Sats go down "I think this started when I started amiodarone   I've read the side effects"   Scheduled Meds:  amiodarone  200 mg Oral Daily   apixaban  5 mg Oral BID   Chlorhexidine Gluconate Cloth  6 each Topical Daily   etomidate  15 mg Intravenous Once   lanthanum  2,000 mg Oral TID WC   midodrine  10 mg Oral TID WC   torsemide  100 mg Oral Once per day on Sun Mon Wed Fri   Continuous Infusions:  anticoagulant sodium citrate     furosemide     PRN Meds: acetaminophen **OR** acetaminophen, polyethylene glycol, senna-docusate   Vital Signs    Vitals:   05/26/22 0245 05/26/22 0438 05/26/22 0500 05/26/22 0842  BP:  109/80  113/88  Pulse: 99 (!) 102    Resp:  20  20  Temp:  98.9 F (37.2 C)  97.9 F (36.6 C)  TempSrc:  Oral  Oral  SpO2: 95% 96%    Weight:   (!) 181.9 kg   Height:        Intake/Output Summary (Last 24 hours) at 05/26/2022 1052 Last data filed at 05/26/2022 0834 Gross per 24 hour  Intake 480 ml  Output 450 ml  Net 30 ml      05/26/2022    5:00 AM 05/25/2022    1:54 PM 05/25/2022    8:48 AM  Last 3 Weights  Weight (lbs) 401 lb 0.3 oz 397 lb 7.8 oz 422 lb 2.9 oz  Weight (kg) 181.9 kg 180.3 kg 191.5 kg      Telemetry    SR 90s    - Personally Reviewed  ECG    No new tracing - Personally Reviewed  Physical Exam   GEN: morbidly obese male in no acute distress.   Neck Neck is full Cardiac: RRR, no murmur Respiratory: Clear to auscultation bilaterally.   GI: Soft, nontender, non-distended  MS: No deformity.  Triv edeam   Neuro:  Nonfocal  Psych: Normal affect   Labs    High Sensitivity Troponin:   Recent Labs  Lab 05/22/22 0732 05/22/22 0937  TROPONINIHS 36* 38*     Chemistry Recent Labs  Lab 05/24/22 0315 05/25/22 0434 05/26/22 0419   NA 133* 134* 134*  K 4.5 4.6 4.1  CL 93* 96* 95*  CO2 22 18* 25  GLUCOSE 106* 100* 112*  BUN 60* 74* 48*  CREATININE 11.57* 12.58* 9.37*  CALCIUM 9.1 9.0 8.9  ALBUMIN 3.4* 3.4* 3.5  GFRNONAA 5* 5* 7*  ANIONGAP 18* 20* 14    Lipids No results for input(s): "CHOL", "TRIG", "HDL", "LABVLDL", "LDLCALC", "CHOLHDL" in the last 168 hours.  Hematology Recent Labs  Lab 05/24/22 0315 05/25/22 0434 05/26/22 0419  WBC 11.9* 12.1* 14.0*  RBC 2.76* 2.76* 2.58*  HGB 9.7* 9.6* 9.1*  HCT 27.8* 27.7* 26.4*  MCV 100.7* 100.4* 102.3*  MCH 35.1* 34.8* 35.3*  MCHC 34.9 34.7 34.5  RDW 14.5 14.6 14.6  PLT 227 230 243   Thyroid No results for input(s): "TSH", "FREET4" in the last 168 hours.  BNP Recent Labs  Lab 05/22/22 0732  BNP 373.1*    DDimer No results for input(s): "DDIMER" in the last 168 hours.   Radiology  DG Chest Port 1 View  Result Date: 05/26/2022 CLINICAL DATA:  sob EXAM: PORTABLE CHEST - 1 VIEW COMPARISON:  05/22/2022 FINDINGS: Stable tunneled right IJ hemodialysis catheter to the proximal RA. Suspect mild central pulmonary vascular congestion, increased since previous. Heart size upper limits normal for technique. No effusion.  Obesity. Visualized bones unremarkable. IMPRESSION: Mild central pulmonary vascular congestion. Electronically Signed   By: Lucrezia Europe M.D.   On: 05/26/2022 10:21    Cardiac Studies   Echo 03/24/22:  1. Left ventricular ejection fraction, by estimation, is 20 to 25%. The  left ventricle has severely decreased function. The left ventricle  demonstrates global hypokinesis. The left ventricular internal cavity size  was moderately dilated. Left  ventricular diastolic parameters were normal.   2. Right ventricular systolic function is normal. The right ventricular  size is mildly enlarged. There is severely elevated pulmonary artery  systolic pressure. The estimated right ventricular systolic pressure is  43.6 mmHg.   3. Left atrial size was  mildly dilated.   4. Right atrial size was mildly dilated.   5. The mitral valve is normal in structure. Mild to moderate mitral valve  regurgitation. No evidence of mitral stenosis.   6. The aortic valve is normal in structure. Aortic valve regurgitation is  not visualized. No aortic stenosis is present.   7. The inferior vena cava is normal in size with greater than 50%  respiratory variability, suggesting right atrial pressure of 3 mmHg.   Patient Profile     36 y.o. male  with a history of HFrEF, Super morbid obesity, and ESRD.  Planned for HD today with hypoxic respiratory failure (chief complaint). Has developed AFL RVR and associated hypotension planned for urgent cardioversion.  Assessment & Plan    Paroxysmal atrial flutter with RVR S/P urgent DCCV in ER    He had been restarted on amio prior by [facility] [place] in clinic  on 10/26 Pt now maintaining sinus rhythm on amiodarone   WIll need to follow LFTs closely   Elevated in past when on amiodarone   NOt clear if due to amiodarone but it was stopped    Note   Pt says he is SOB, read side effects of amio  Thinks it is due to amio Will check ESR and high resolution noncontrast CT  CHADSVASc is 1 (CHF)    WIll be on DOAC for 1 month  at least   HFrEF    Nonischemic   Volume status maintained by dialysis      Had 3 L  removed   Weight is below previous dry wts  ESRD on HD Dialyzed ysterday     Hypotension  - has had multiple hospitalizations for heart failure - on HD for volume control - complicated by hypotension.  Maintained on midodrine     Super morbid obesity Insurance will not cover ozempic  OSA Continues with BiPAP      For questions or updates, please contact [place] Please consult [email or web address] for contact info under        Signed, Dorris Carnes, MD  05/26/2022, 10:52 AM

## 2022-05-26 NOTE — Progress Notes (Signed)
Transition of Care Department [facility]) following patient for high risk of readmission.   Patient with history of acute on chronic respiratory failure with hypoxia, HFrEF, OSA on BIPAP, morbid obesity, ESRD on HD presented with shortness of breath, lightheadedness, and palpitations admitted with acute on chronic respiratory failure with hypoxia. Patient has bipap at home from [place].   Transition of Care Department [facility]) has reviewed patient and we will continue to monitor patient advancement through interdisciplinary progression rounds.

## 2022-05-26 NOTE — Progress Notes (Signed)
Pt receives out-pt HD at [facility] on TTS. Pt arrives at 6:30 am for 6:50 chair time. Will assist as needed.   Melven Sartorius Renal Navigator [phone]

## 2022-05-26 NOTE — Progress Notes (Addendum)
HD#2 Subjective:   Summary:   Phillip Young is a 36 yo male with history of acute on chronic respiratory failure with hypoxia, HFrEF, OSA on BIPAP, morbid obesity, ESRD on HD presented with shortness of breath, lightheadedness, and palpitations admitted with acute on chronic respiratory failure with hypoxia.   Overnight Events: No overnight events  Subjective: Still reporting shortness of breath. States that he gets lightheaded when he tries to sit up and walk about. Reports he has been coughing up brown colored phlegm. Says he has not had BM for several days.   Objective:  Vital signs in last 24 hours: Vitals:   05/26/22 0500 05/26/22 0842 05/26/22 1222 05/26/22 1224  BP:  113/88 136/85 136/85  Pulse:    (!) 105  Resp:  20    Temp:  97.9 F (36.6 C) 97.8 F (36.6 C)   TempSrc:  Oral Oral   SpO2:    (!) 89%  Weight: (!) 181.9 kg     Height:       Supplemental O2: Nasal Cannula 5L SpO2: (!) 89 % O2 Flow Rate (L/min): 5 L/min FiO2 (%): 40 %   Physical Exam:   Constitutional:      General: He is not in acute distress.    Appearance: He is well-developed. He is obese. He is not ill-appearing, toxic-appearing or diaphoretic.  Cardiovascular:     Rate and Rhythm: Regular rhythm.      Heart sounds: No murmur heard. No LEE Pulmonary:     Effort: Tachypnea present. No accessory muscle usage or respiratory distress.     Breath sounds: No wheezing, rhonchi or rales.  Skin:    General: Skin is warm.  Neurological:     Mental Status: no focal deficits  Assessment/Plan:   Principal Problem:   Acute hypoxic respiratory failure ([name]) Active Problems:   OSA (obstructive sleep apnea)   Chronic systolic CHF (congestive heart failure) ([name])   Paroxysmal atrial flutter ([name])   ESRD (end stage renal disease) on dialysis ([place])   Hypotension   Patient Summary:  Phillip Young is a Phillip Young is a 36 yo male with history of acute on chronic respiratory failure with  hypoxia, HFrEF, OSA on BIPAP, morbid obesity, ESRD on HD presented with shortness of breath, lightheadedness, and palpitations admitted with acute on chronic respiratory failure with hypoxia.   Assessment & Plan by Problem: Principal Problem:   Acute hypoxic respiratory failure ([name])   # Acute on chronic hypoxic respiratory failure Likely due to volume overload, though obesity hypoventilation and his afib may be contributing factors. Continues to require 5L [place] to meet O2 needs. He reports productive cough with brown sputum, however, CXR demonstrated increased vascular congestion suggestive of volume overload. He is scheduled for HD tomorrow. If no improvement after tomorrow's HD, could consider checking procal to evaluate for infectious etiology. Will continue to treat volume overload as etiology for respiratory failure at this time.  He is planned for high res CT per cardiology which will also help [place] assess for underlying etiology. - BiPAP at night - volume mgmt via dialysis and torsemide on non-hd days - daily bmp - f/u CT  # HFrEF with EF 20-25% 03/24/22 - nonischemic Volume overloaded still. Exam limited by body habitus. GDMT limited due to hypotension. Pt Requires midodrine on HD days. Cardiology assisting.   # Atrial flutter and hypotension s/p Cardioversion Arrhythmia set off likely due to obesity hypoventilation syndrome, Hypoxemia, Afib,  anemia, CHF. Pt remains  in sinus rhythm post cardioversion. Pt hemodynamically stable. Denies CP, palpations.   CHA2DS2-VASc score : 2 indicating need for anticoagulation. - Continue amiodarone -continue Eliquis -Continue BIPAP   # ESRD on HD Did not receive HD yesterday. His weight is up today with worsening BUN and phosphate. Receiving HD today with plan for 3L volume removal. -Volume control via HD while in the hospital.  - midodrine with HD - torsemide non-hd days   #  Iron deficiency Anemia Iron studies done in 03/2022 reflected iron  deficiency anemia ( Iron 27 and Iron saturation 8). Likely also has component related to his ESRD - Iron transfusion- Ferrlecit 250 mg x 2 doses - Outpatient colonoscopy  - daily CBC    #  OSA on BIPAP  Continue BIPAP as needed and every night.  # constipation - PRN miralax and Senna     Best practices Code: FULL Diet: NPO IVF: None Dispo: Anticipated discharge to Home in 2 days pending Improvement in respiratory status  Delene Ruffini, MD

## 2022-05-27 DIAGNOSIS — N186 End stage renal disease: Secondary | ICD-10-CM | POA: Diagnosis not present

## 2022-05-27 DIAGNOSIS — J9601 Acute respiratory failure with hypoxia: Secondary | ICD-10-CM | POA: Diagnosis not present

## 2022-05-27 DIAGNOSIS — I5023 Acute on chronic systolic (congestive) heart failure: Secondary | ICD-10-CM | POA: Diagnosis not present

## 2022-05-27 DIAGNOSIS — J9621 Acute and chronic respiratory failure with hypoxia: Secondary | ICD-10-CM | POA: Diagnosis not present

## 2022-05-27 DIAGNOSIS — I483 Typical atrial flutter: Secondary | ICD-10-CM | POA: Diagnosis not present

## 2022-05-27 DIAGNOSIS — J189 Pneumonia, unspecified organism: Secondary | ICD-10-CM | POA: Diagnosis not present

## 2022-05-27 DIAGNOSIS — I5020 Unspecified systolic (congestive) heart failure: Secondary | ICD-10-CM | POA: Diagnosis not present

## 2022-05-27 DIAGNOSIS — Z20822 Contact with and (suspected) exposure to covid-19: Secondary | ICD-10-CM | POA: Diagnosis not present

## 2022-05-27 LAB — RENAL FUNCTION PANEL
Albumin: 3.3 g/dL — ABNORMAL LOW (ref 3.5–5.0)
Anion gap: 19 — ABNORMAL HIGH (ref 5–15)
BUN: 62 mg/dL — ABNORMAL HIGH (ref 6–20)
CO2: 22 mmol/L (ref 22–32)
Calcium: 9.2 mg/dL (ref 8.9–10.3)
Chloride: 93 mmol/L — ABNORMAL LOW (ref 98–111)
Creatinine, Ser: 10.86 mg/dL — ABNORMAL HIGH (ref 0.61–1.24)
GFR, Estimated: 6 mL/min — ABNORMAL LOW (ref >60)
Glucose, Bld: 109 mg/dL — ABNORMAL HIGH (ref 70–99)
Phosphorus: 4.8 mg/dL — ABNORMAL HIGH (ref 2.5–4.6)
Potassium: 4.3 mmol/L (ref 3.5–5.1)
Sodium: 134 mmol/L — ABNORMAL LOW (ref 135–145)

## 2022-05-27 LAB — CBC
HCT: 25.8 % — ABNORMAL LOW (ref 39.0–52.0)
Hemoglobin: 8.7 g/dL — ABNORMAL LOW (ref 13.0–17.0)
MCH: 34.4 pg — ABNORMAL HIGH (ref 26.0–34.0)
MCHC: 33.7 g/dL (ref 30.0–36.0)
MCV: 102 fL — ABNORMAL HIGH (ref 80.0–100.0)
Platelets: 245 10*3/uL (ref 150–400)
RBC: 2.53 MIL/uL — ABNORMAL LOW (ref 4.22–5.81)
RDW: 14.6 % (ref 11.5–15.5)
WBC: 16.1 10*3/uL — ABNORMAL HIGH (ref 4.0–10.5)
nRBC: 0 % (ref 0.0–0.2)

## 2022-05-27 LAB — TSH: TSH: 1.75 u[IU]/mL (ref 0.350–4.500)

## 2022-05-27 LAB — T4, FREE: Free T4: 0.82 ng/dL (ref 0.61–1.12)

## 2022-05-27 MED ORDER — SODIUM CHLORIDE 0.9 % IV SOLN
2.0000 g | INTRAVENOUS | Status: AC
  Administered 2022-05-27 – 2022-05-31 (×5): 2 g via INTRAVENOUS
  Filled 2022-05-27 (×5): qty 20

## 2022-05-27 MED ORDER — DOXYCYCLINE HYCLATE 100 MG PO TABS
100.0000 mg | ORAL_TABLET | Freq: Two times a day (BID) | ORAL | Status: AC
  Administered 2022-05-27 – 2022-05-31 (×10): 100 mg via ORAL
  Filled 2022-05-27 (×10): qty 1

## 2022-05-27 NOTE — Progress Notes (Signed)
Attempted to give CHG bath and patient refused saying that he will do it when he wakes up.

## 2022-05-27 NOTE — Progress Notes (Signed)
HD#5  Subjective:   Summary:  Phillip Young is a 36 yo male with history of acute on chronic respiratory failure with hypoxia, HFrEF, OSA on BIPAP, morbid obesity, ESRD on HD presented with shortness of breath, lightheadedness, and palpitations admitted with acute on chronic respiratory failure with hypoxia.    Overnight Events: No overnight events   Subjective: Still reporting shortness of breath.SHOB worse with physical activity. Reports he has been coughing up brown colored phlegm. Denies fever. Pt declined to go for HD today. Reports he does not feel well to go for HD today.  Objective:  Vital signs in last 24 hours: Vitals:   05/27/22 0759 05/27/22 0800 05/27/22 0910 05/27/22 1223  BP:  100/73 113/75 107/68  Pulse: 97 96 99 93  Resp:  18    Temp:  98.8 F (37.1 C)  98.5 F (36.9 C)  TempSrc:  Oral  Oral  SpO2: 100% 100% 100% 94%  Weight:      Height:       Supplemental O2: Nasal Cannula SpO2: 94 % O2 Flow Rate (L/min): 5 L/min FiO2 (%): 40 %   Physical Exam:  Physical Exam Vitals and nursing note reviewed.  Constitutional:      General: He is in acute distress (secondary to SOB, worse with physical activity).     Appearance: He is obese. He is not diaphoretic.  HENT:     Head: Normocephalic.  Cardiovascular:     Rate and Rhythm: Normal rate and regular rhythm.  Pulmonary:     Effort: Tachypnea present. No accessory muscle usage.     Breath sounds: Normal breath sounds. No decreased breath sounds or wheezing.  Skin:    General: Skin is warm.  Neurological:     Mental Status: He is alert.  Psychiatric:        Mood and Affect: Mood normal.        Behavior: Behavior normal.      Filed Weights   05/25/22 1354 05/26/22 0500 05/27/22 0500  Weight: (!) 180.3 kg (!) 181.9 kg (!) 186.1 kg     Intake/Output Summary (Last 24 hours) at 05/27/2022 1447 Last data filed at 05/27/2022 1144 Gross per 24 hour  Intake 360 ml  Output 450 ml  Net -90 ml   Net  IO Since Admission: -1,736 mL [05/27/22 1447]  Pertinent Labs:    Latest Ref Rng & Units 05/27/2022    3:34 AM 05/26/2022    4:19 AM 05/25/2022    4:34 AM  CBC  WBC 4.0 - 10.5 K/uL 16.1  14.0  12.1   Hemoglobin 13.0 - 17.0 g/dL 8.7  9.1  9.6   Hematocrit 39.0 - 52.0 % 25.8  26.4  27.7   Platelets 150 - 400 K/uL 245  243  230        Latest Ref Rng & Units 05/27/2022    3:34 AM 05/26/2022    4:19 AM 05/25/2022    4:34 AM  CMP  Glucose 70 - 99 mg/dL 109  112  100   BUN 6 - 20 mg/dL 62  48  74   Creatinine 0.61 - 1.24 mg/dL 10.86  9.37  12.58   Sodium 135 - 145 mmol/L 134  134  134   Potassium 3.5 - 5.1 mmol/L 4.3  4.1  4.6   Chloride 98 - 111 mmol/L 93  95  96   CO2 22 - 32 mmol/L 22  25  18    Calcium 8.9 -  10.3 mg/dL 9.2  8.9  9.0     Imaging: CT Chest High Resolution  Result Date: 05/27/2022 CLINICAL DATA:  Shortness of breath EXAM: CT CHEST WITHOUT CONTRAST TECHNIQUE: Multidetector CT imaging of the chest was performed following the standard protocol without intravenous contrast. High resolution imaging of the lungs, as well as inspiratory and expiratory imaging, was performed. RADIATION DOSE REDUCTION: This exam was performed according to the departmental dose-optimization program which includes automated exposure control, adjustment of the mA and/or kV according to patient size and/or use of iterative reconstruction technique. COMPARISON:  Chest CT dated May 22, 2022 FINDINGS: Cardiovascular: Marked cardiomegaly. No pericardial effusion. No coronary artery calcifications. Normal caliber thoracic aorta with mild calcified plaque. Dilated main pulmonary artery, measuring up to 3.2 cm. Mediastinum/Nodes: Esophagus and thyroid are unremarkable. Enlarged mediastinal and hilar lymph nodes, unchanged when compared with recent prior. Reference periaortic lymph node measuring 2.3 cm in short axis on series 5, image 64. Reference right hilar lymph node measuring 1.8 cm in short axis on  series 5, image 90. Lungs/Pleura: Central airways are patent. Bilateral mosaic attenuation. New bilateral masslike consolidations clustered areas of nodularity. Septal thickening and diffuse ground-glass opacities. Upper Abdomen: No acute abnormality. Musculoskeletal: No chest wall mass or suspicious bone lesions identified. IMPRESSION: 1. New bilateral masslike consolidations and clustered areas of nodularity, findings are favored to be due to multifocal pneumonia. Inflammatory etiology such as drug toxicity is an additional consideration, however there are no specific findings to suggest amiodarone toxicity. 2. Diffuse bilateral ground-glass opacities and septal thickening, likely due to superimposed pulmonary edema. 3. Unchanged mediastinal and bilateral hilar lymphadenopathy, 4. Mosaic attenuation, which can be seen in the setting of small airways disease or pulmonary hypertension. 5. Dilated main pulmonary artery which can be seen in the setting of pulmonary hypertension. 6. Marked cardiomegaly. 7. Aortic Atherosclerosis (ICD10-I70.0). Electronically Signed   By: Yetta Glassman M.D.   On: 05/27/2022 08:21    Assessment/Plan:   Principal Problem:   Acute hypoxic respiratory failure ([name]) Active Problems:   OSA (obstructive sleep apnea)   Chronic systolic CHF (congestive heart failure) ([name])   Paroxysmal atrial flutter ([name])   ESRD (end stage renal disease) on dialysis ([place])   Hypotension   Patient Summary: Phillip Young is a 36 y.o. with a pertinent PMH of acute on chronic respiratory failure with hypoxia, HFrEF, OSA on BIPAP, morbid obesity, ESRD on HD presented with shortness of breath, lightheadedness, and palpitations admitted with acute on chronic respiratory failure with hypoxia.    Assessment & Plan by Problem: Principal Problem:   Acute hypoxic respiratory failure ([name])   # Acute on chronic hypoxic respiratory failure Likely due to volume overload, though obesity hypoventilation  and his afib may be contributing factors. Continues to require 5L [place] to meet O2 needs. He reports productive cough with brown sputum. He is scheduled for HD today which he declined. His weight is noted to be up at 187 from 181 few days ago. Will continue to treat volume overload as etiology for respiratory failure at this time.   - BiPAP at night - volume mgmt via dialysis and torsemide on non-hd days - daily bmp - f/u CT   # Pneumonia: Pt c/o productive cough with brown color phlegm and worsening SOB. Denies fever or chills. Elevated WBC count of 16.1 CXR reflected mild pulmonary congestion. CT chest High resolution showed : New bilateral masslike consolidations and clustered areas of nodularity, findings are favored to be due to  multifocal pneumonia. CT findings explains worsening SOB with purulent phlegm from the infectious etiology. CXR and CT chest Findings were discussed with the patient. Plan to start antibiotic -Ceftriaxone IV - Tab Doxycycline 100 mg BID  # HFrEF with EF 20-25% 03/24/22 - nonischemic Volume overloaded still. Exam limited by body habitus. GDMT limited due to hypotension. Pt Requires midodrine on HD days. Cardiology assisting.   # Atrial flutter and hypotension s/p Cardioversion Arrhythmia set off likely due to obesity hypoventilation syndrome, Hypoxemia, Afib,  anemia, CHF. Pt remains in sinus rhythm post cardioversion. Pt hemodynamically stable. Denies CP, palpations.   CHA2DS2-VASc score : 2 indicating need for anticoagulation. - Continue amiodarone -continue Eliquis -Continue BIPAP   # ESRD on HD Total UF Removed 3400 on 10/31. He is declining HD today. He will benefit form volume removal via HD in the setting of SOB.Pt reports he is not feeling well enough to go for HD today. -Volume control via HD while in the hospital.  - midodrine with HD - torsemide non-hd days   #  Iron deficiency Anemia Iron studies done in 03/2022 reflected iron deficiency anemia ( Iron  27 and Iron saturation 8). Likely also has component related to his ESRD. Received Iron transfusion- Ferrlecit 250 mg x 2 doses - Outpatient colonoscopy  - daily CBC    #  OSA on BIPAP  Continue BIPAP as needed and every night.   # constipation - PRN miralax and Senna     Best practices Code: FULL Diet: NPO IVF: None Dispo: Anticipated discharge to Home in 2 days pending Improvement in respiratory status     Teola Bradley, MD Internal Medicine Resident PGY-1 Pager: [phone] Please contact the on call pager after 5 pm and on weekends at [phone].

## 2022-05-27 NOTE — Consult Note (Addendum)
Advanced Heart Failure Team Consult Note   Primary Physician: [name], MD PCP-Cardiologist:  [name], MD  Reason for Consultation:  chronic biventricular heart failure, in setting of recurrent atrial flutter w/ RVR c/b amiodarone intolerability   HPI:    Phillip Young is seen today for evaluation of chronic biventricular heart failure, in setting of recurrent atrial flutter w/ RVR c/b amiodarone intolerability, at the request of Dr. Harrington Challenger.   36 y.o. with history of cardiomyopathy and obesity.  He has been known to have a cardiomyopathy (biventricular failure) since back in 2019, echo in 2019 with EF 20% [facility]).  He has been followed in the [facility] system.  Admitted to [facility] in 3/41 with A/C Systolic HF. Diuresed with IV lasix and transitioned to torsemide. Placed on milrinone with low output and had RHC/LHC. Cath showed no CAD and volume overload.  Blood gas revealed a CO2 level elevated to 78. CPAP was initiated.    He was unable to tolerate Coreg due to wheezing and diarrhea.    Echo in 11/21 showed EF 20-25%, RV poorly visualized.  He was seen by EP, thought to be too large for cardiac MRI.    Patient was admitted in 7/22 with cardiogenic shock and AKI.  He ended up on CVVH. He had a prolonged hospitalization for volume removal and titration off inotropes/pressors.  Eventually he was able to transition to [facility] and was discharged. Hospitalization was complicated by AF with RVR as well as HIT.  He was cardioverted several times.  Echo in 7/22 showed EF < 20%, severe LV enlargement, normal RV.    Admitted 9/22 with MSSA bacteremia due to infected HD catheter. TEE showed EF 20%, severely decreased LV with global HK, moderately reduced RV, no vegetation or thrombus noted. Hospitalization complicated by septic arthritis and muscle abscess. IV abx per ID.     Admitted 1/23 at [facility] with a/c CHF.  Re-admitted shortly thereafter after presenting to HD  tachycardic and tachypneic. HD staff unable to access his dialysis catheter. He underwent a tunnel catheter exchange and taken for AV fistula creation.    Admitted 2/23 for acute on chronic hypoxic respiratory failure and a/c CHF in setting of incomplete HD sessions and dietary indiscretion/noncompliance with fluid restriction. He received serial HD sessions and was seen by PMT for [name], he remained full code.    Patient had recurrent AF with DCCV in 3/23. Amiodarone stopped due to elevated LFTs.    Echo (8/23) showed EF 20-25%, global LV HK, normal RV, RVSP 70.6 mmHg, mild to moderate MR   Admitted 9/23 with a/c CHF in setting of missing dialysis session and flu-like illness. Underwent dialysis session. No source of infection identified and abx stopped at discharge. Palliative care consulted as patient was thinking about stopping dialysis. He remained full code.  Seen in EP clinic by Dr. Curt Bears on 10/26 and EKG showed he was back in AFL. Decision was made to restart PO amio and set up for outpatient DCCV.   He presented to the ED 2 days later on 10/28 w/ acute hypoxic respiratory failure, in the setting of rapid AFL and hypotension, required BiPAP. Underwent emergent DCCV in the ED. Nephrology consulted, iHD resumed. Remains on PO amiodarone but feels poorly.   Of note ESR was elevated on admit, 113. High resolution chest CT showed new bilateral mass like consolidations and cluster areas of nodularity. Findings favored to be multifocal PNA, though inflammatory etiology such as  amiodarone drug toxicity also consideration.   He remains SOB this morning w/ productive cough w/ brown/rust colored sputum but now off BiPAP. On 4L [place]. WBC 16K. AF. Primary team starting antibiotics.   Maintaining NSR currently, HR 90s.   Reports he has been compliant w/ iHD and w/ home CPAP.    Echo 03/24/22 Left ventricular ejection fraction, by estimation, is 20 to 25%. The left ventricle has severely decreased  function. The left ventricle demonstrates global hypokinesis. The left ventricular internal cavity size was moderately dilated. Left ventricular diastolic parameters were normal. 1. Right ventricular systolic function is normal. The right ventricular size is mildly enlarged. There is severely elevated pulmonary artery systolic pressure. The estimated right ventricular systolic pressure is 37.1 mmHg. 2. 3. Left atrial size was mildly dilated. 4. Right atrial size was mildly dilated. The mitral valve is normal in structure. Mild to moderate mitral valve regurgitation. No evidence of mitral stenosis. 5. The aortic valve is normal in structure. Aortic valve regurgitation is not visualized. No aortic stenosis is present. 6. The inferior vena cava is normal in size with greater than 50% respiratory variability, suggesting right atrial pressure of 3 mmHg.  HR Chest CT 05/26/22   IMPRESSION: 1. New bilateral masslike consolidations and clustered areas of nodularity, findings are favored to be due to multifocal pneumonia. Inflammatory etiology such as drug toxicity is an additional consideration, however there are no specific findings to suggest amiodarone toxicity. 2. Diffuse bilateral ground-glass opacities and septal thickening, likely due to superimposed pulmonary edema. 3. Unchanged mediastinal and bilateral hilar lymphadenopathy, 4. Mosaic attenuation, which can be seen in the setting of small airways disease or pulmonary hypertension. 5. Dilated main pulmonary artery which can be seen in the setting of pulmonary hypertension. 6. Marked cardiomegaly. 7. Aortic Atherosclerosis (ICD10-I70.0).    Review of Systems: [y] = yes, _0  = no   General: Weight gain _1 ; Weight loss _2 ; Anorexia _3 ; Fatigue _4 ; Fever _5 ; Chills _6 ; Weakness _7   Cardiac: Chest pain/pressure _8 ; Resting SOB [Y ]; Exertional SOB [Y ]; Orthopnea _9 ; Pedal Edema _10 ; Palpitations [Y ]; Syncope _11 ;  Presyncope _12 ; Paroxysmal nocturnal dyspnea_13   Pulmonary: Cough Jazmín.Cullens ]; Wheezing_14 ; Hemoptysis_15 ; Sputum [ Y]; Snoring _16   GI: Vomiting_17 ; Dysphagia_18 ; Melena_19 ; Hematochezia _20 ; Heartburn_21 ; Abdominal pain _22 ; Constipation _23 ; Diarrhea _24 ; BRBPR _25   GU: Hematuria_26 ; Dysuria _27 ; Nocturia_28   Vascular: Pain in legs with walking _29 ; Pain in feet with lying flat _30 ; Non-healing sores _31 ; Stroke _32 ; TIA _33 ; Slurred speech _34 ;  Neuro: Headaches_35 ; Vertigo_36 ; Seizures_37 ; Paresthesias_38 ;Blurred vision _39 ; Diplopia _40 ; Vision changes _41   Ortho/Skin: Arthritis _42 ; Joint pain _43 ; Muscle pain _44 ; Joint swelling _45 ; Back Pain _46 ; Rash _47   Psych: Depression_48 ; Anxiety_49   Heme: Bleeding problems _50 ; Clotting disorders _51 ; Anemia _52   Endocrine: Diabetes _53 ; Thyroid dysfunction_54   Home Medications Prior to Admission medications   Medication Sig Start Date End Date Taking? Authorizing Provider  amiodarone (PACERONE) 200 MG tablet Take 2 tablets (400 mg total) TWICE daily for 2 weeks, then take 1 tablet (200 mg total) TWICE daily for 2 weeks, then take 1 tablet (200 mg total) ONCE daily 05/20/22  Yes Camnitz, Ocie Doyne, MD  apixaban (  ELIQUIS) 5 MG TABS tablet Take 1 tablet (5 mg total) by mouth 2 (two) times daily. 12/16/21  Yes Larey Dresser, MD  diclofenac Sodium (VOLTAREN) 1 % GEL Apply 4 g topically 3 (three) times daily as needed (joint pains). 03/28/22  Yes Lacinda Axon, MD  lanthanum (FOSRENOL) 1000 MG chewable tablet Chew 2,000 mg by mouth 3 (three) times daily with meals.   Yes [provider]  Menthol (HALLS COUGH DROPS) 5.8 MG LOZG Use as directed 1 lozenge in the mouth or throat every 4 (four) hours as needed (cough).   Yes [provider]    Past Medical History: Past Medical History:  Diagnosis Date   Acute on chronic respiratory failure with hypoxia ([place]) 04/21/2021   Acute on chronic systolic (congestive) heart failure ([name])  02/26/2020   Biventricular congestive heart failure ([place])    Last Echo 11/2019 at [facility] reveals EF 20%   Class 3 severe obesity due to excess calories with serious comorbidity and body mass index (BMI) of 50.0 to 59.9 in adult [facility]) 02/26/2020   Essential hypertension 02/26/2020   GERD without esophagitis 02/26/2020   Hidradenitis suppurativa 02/26/2020   OSA (obstructive sleep apnea) 02/26/2020   Prediabetes 02/26/2020   Renal disorder     Past Surgical History: Past Surgical History:  Procedure Laterality Date   ABSCESS DRAINAGE     AV FISTULA PLACEMENT Left 08/21/2021   Procedure: LEFT ARM ARTERIOVENOUS (AV) FISTULA.;  Surgeon: Serafina Mitchell, MD;  Location: [place];  Service: Vascular;  Laterality: Left;   CARDIOVERSION N/A 10/09/2021   Procedure: CARDIOVERSION;  Surgeon: Larey Dresser, MD;  Location: [place];  Service: Cardiovascular;  Laterality: N/A;   IR FLUORO GUIDE CV LINE RIGHT  03/10/2021   IR FLUORO GUIDE CV LINE RIGHT  04/22/2021   IR FLUORO GUIDE CV LINE RIGHT  08/20/2021   IR US GUIDE VASC ACCESS RIGHT  03/10/2021   IR US GUIDE VASC ACCESS RIGHT  04/22/2021   RIGHT HEART CATH N/A 03/06/2021   Procedure: RIGHT HEART CATH;  Surgeon: Jolaine Artist, MD;  Location: [place] CV LAB;  Service: Cardiovascular;  Laterality: N/A;   RIGHT/LEFT HEART CATH AND CORONARY ANGIOGRAPHY N/A 03/04/2020   Procedure: RIGHT/LEFT HEART CATH AND CORONARY ANGIOGRAPHY;  Surgeon: Larey Dresser, MD;  Location: [place] CV LAB;  Service: Cardiovascular;  Laterality: N/A;   TEE WITHOUT CARDIOVERSION N/A 05/05/2021   Procedure: TRANSESOPHAGEAL ECHOCARDIOGRAM (TEE);  Surgeon: Larey Dresser, MD;  Location: [facility] ENDOSCOPY;  Service: Cardiovascular;  Laterality: N/A;   TEMPORARY DIALYSIS CATHETER  03/06/2021   Procedure: TEMPORARY DIALYSIS CATHETER;  Surgeon: Jolaine Artist, MD;  Location: [place] CV LAB;  Service: Cardiovascular;;    Family History: Family History  Problem  Relation Age of Onset   Heart disease Mother    Hypertension Mother    Pulmonary Hypertension Mother    Drug abuse Father        died due to Heroin overdose    Social History: Social History   Socioeconomic History   Marital status: Single    Spouse name: Not on file   Number of children: Not on file   Years of education: Not on file   Highest education level: Not on file  Occupational History   Not on file  Tobacco Use   Smoking status: Former    Packs/day: 1.00    Types: Cigarettes    Quit date: 2019    Years since  quitting: 4.8    Passive exposure: Current   Smokeless tobacco: Former   Tobacco comments:    quit in 2019  Vaping Use   Vaping Use: Never used  Substance and Sexual Activity   Alcohol use: Never   Drug use: Never   Sexual activity: Not on file  Other Topics Concern   Not on file  Social History Narrative   Not on file   Social Determinants of Health   Financial Resource Strain: Low Risk  (04/13/2021)   Overall Financial Resource Strain (CARDIA)    Difficulty of Paying Living Expenses: Not very hard  Food Insecurity: No Food Insecurity (05/23/2022)   Hunger Vital Sign    Worried About Running Out of Food in the Last Year: Never true    Ran Out of Food in the Last Year: Never true  Transportation Needs: No Transportation Needs (05/23/2022)   PRAPARE - Hydrologist (Medical): No    Lack of Transportation (Non-Medical): No  Physical Activity: Not on file  Stress: Not on file  Social Connections: Not on file    Allergies:  Allergies  Allergen Reactions   Coreg [Carvedilol] Shortness Of Breath and Diarrhea    Wheezing    Heparin Other (See Comments)    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, SRA positive    Objective:    Vital Signs:   Temp:  [97.8 F (36.6 C)-98.8 F (37.1 C)] 98.8 F (37.1 C) (11/02 0800) Pulse Rate:  [91-105] 96 (11/02 0800) Resp:  [18-20] 18 (11/02 0800) BP: (99-136)/(58-85) 100/73 (11/02  0800) SpO2:  [89 %-100 %] 100 % (11/02 0800) Weight:  [186.1 kg] 186.1 kg (11/02 0500) Last BM Date : 05/26/22 (patient reported.)  Weight change: Filed Weights   05/25/22 1354 05/26/22 0500 05/27/22 0500  Weight: (!) 180.3 kg (!) 181.9 kg (!) 186.1 kg    Intake/Output:   Intake/Output Summary (Last 24 hours) at 05/27/2022 0849 Last data filed at 05/26/2022 2136 Gross per 24 hour  Intake 720 ml  Output 200 ml  Net 520 ml      Physical Exam    General:  morbidly obese young male, coughing but no increased WOB HEENT: normal Neck: supple. Thick neck, unable to visualized JVD . Carotids 2+ bilat; no bruits. No lymphadenopathy or thyromegaly appreciated. Cor: PMI nondisplaced. Regular rate & rhythm. No rubs, gallops or murmurs. Lungs: decreased BS at the  bases fait basilar wheezing  Abdomen: obese, soft, nontender, nondistended. No hepatosplenomegaly. No bruits or masses. Good bowel sounds. Extremities: no cyanosis, clubbing, rash, edema Neuro: alert & orientedx3, cranial nerves grossly intact. moves all 4 extremities w/o difficulty. Affect pleasant   Telemetry   NSR 90s   EKG    Admit EKG 10/28 showed AFL 113 bpm  Labs   Basic Metabolic Panel: Recent Labs  Lab 05/23/22 0550 05/24/22 0315 05/25/22 0434 05/26/22 0419 05/27/22 0334  NA 133* 133* 134* 134* 134*  K 3.5 4.5 4.6 4.1 4.3  CL 92* 93* 96* 95* 93*  CO2 24 22 18* 25 22  GLUCOSE 133* 106* 100* 112* 109*  BUN 42* 60* 74* 48* 62*  CREATININE 9.39* 11.57* 12.58* 9.37* 10.86*  CALCIUM 9.4 9.1 9.0 8.9 9.2  PHOS  --  6.6* 7.3* 5.2* 4.8*    Liver Function Tests: Recent Labs  Lab 05/24/22 0315 05/25/22 0434 05/26/22 0419 05/27/22 0334  ALBUMIN 3.4* 3.4* 3.5 3.3*   No results for input(s): "LIPASE", "AMYLASE" in the last 168  hours. No results for input(s): "AMMONIA" in the last 168 hours.  CBC: Recent Labs  Lab 05/23/22 0550 05/24/22 0315 05/25/22 0434 05/26/22 0419 05/27/22 0334  WBC 13.9*  11.9* 12.1* 14.0* 16.1*  HGB 10.9* 9.7* 9.6* 9.1* 8.7*  HCT 31.4* 27.8* 27.7* 26.4* 25.8*  MCV 101.6* 100.7* 100.4* 102.3* 102.0*  PLT 250 227 230 243 245    Cardiac Enzymes: No results for input(s): "CKTOTAL", "CKMB", "CKMBINDEX", "TROPONINI" in the last 168 hours.  BNP: BNP (last 3 results) Recent Labs    02/18/22 0154 03/26/22 1958 05/22/22 0732  BNP 373.8* 374.2* 373.1*    ProBNP (last 3 results) No results for input(s): "PROBNP" in the last 8760 hours.   CBG: No results for input(s): "GLUCAP" in the last 168 hours.  Coagulation Studies: No results for input(s): "LABPROT", "INR" in the last 72 hours.   Imaging   CT Chest High Resolution  Result Date: 05/27/2022 CLINICAL DATA:  Shortness of breath EXAM: CT CHEST WITHOUT CONTRAST TECHNIQUE: Multidetector CT imaging of the chest was performed following the standard protocol without intravenous contrast. High resolution imaging of the lungs, as well as inspiratory and expiratory imaging, was performed. RADIATION DOSE REDUCTION: This exam was performed according to the departmental dose-optimization program which includes automated exposure control, adjustment of the mA and/or kV according to patient size and/or use of iterative reconstruction technique. COMPARISON:  Chest CT dated May 22, 2022 FINDINGS: Cardiovascular: Marked cardiomegaly. No pericardial effusion. No coronary artery calcifications. Normal caliber thoracic aorta with mild calcified plaque. Dilated main pulmonary artery, measuring up to 3.2 cm. Mediastinum/Nodes: Esophagus and thyroid are unremarkable. Enlarged mediastinal and hilar lymph nodes, unchanged when compared with recent prior. Reference periaortic lymph node measuring 2.3 cm in short axis on series 5, image 64. Reference right hilar lymph node measuring 1.8 cm in short axis on series 5, image 90. Lungs/Pleura: Central airways are patent. Bilateral mosaic attenuation. New bilateral masslike  consolidations clustered areas of nodularity. Septal thickening and diffuse ground-glass opacities. Upper Abdomen: No acute abnormality. Musculoskeletal: No chest wall mass or suspicious bone lesions identified. IMPRESSION: 1. New bilateral masslike consolidations and clustered areas of nodularity, findings are favored to be due to multifocal pneumonia. Inflammatory etiology such as drug toxicity is an additional consideration, however there are no specific findings to suggest amiodarone toxicity. 2. Diffuse bilateral ground-glass opacities and septal thickening, likely due to superimposed pulmonary edema. 3. Unchanged mediastinal and bilateral hilar lymphadenopathy, 4. Mosaic attenuation, which can be seen in the setting of small airways disease or pulmonary hypertension. 5. Dilated main pulmonary artery which can be seen in the setting of pulmonary hypertension. 6. Marked cardiomegaly. 7. Aortic Atherosclerosis (ICD10-I70.0). Electronically Signed   By: Yetta Glassman M.D.   On: 05/27/2022 08:21   DG Chest Port 1 View  Result Date: 05/26/2022 CLINICAL DATA:  sob EXAM: PORTABLE CHEST - 1 VIEW COMPARISON:  05/22/2022 FINDINGS: Stable tunneled right IJ hemodialysis catheter to the proximal RA. Suspect mild central pulmonary vascular congestion, increased since previous. Heart size upper limits normal for technique. No effusion.  Obesity. Visualized bones unremarkable. IMPRESSION: Mild central pulmonary vascular congestion. Electronically Signed   By: Lucrezia Europe M.D.   On: 05/26/2022 10:21     Medications:     Current Medications:  amiodarone  200 mg Oral Daily   apixaban  5 mg Oral BID   Chlorhexidine Gluconate Cloth  6 each Topical Daily   etomidate  15 mg Intravenous Once   lanthanum  2,000  mg Oral TID WC   midodrine  10 mg Oral TID WC   torsemide  100 mg Oral Once per day on Sun Mon Wed Fri    Infusions:  anticoagulant sodium citrate     furosemide        Patient Profile   37 y/o  male w/ chronic biventricular heart failure due to NICM, PAF/AFL s/p multiple DCCVs, multiple intolerances to amiodarone, ESRD on iHD, OSA on CPAP and morbid obesity, admitted for acute hypoxic respiratory failure and hypotension in the setting of recurrent ALF w/ RVR, requiring emergent DCCV in ED. Now w/ concern for potential amiodarone pulmonary toxicity. Also being treated for possible PNA.    Assessment/Plan   1. Recurrent AFL w/ RVR - s/p multiple DCCVs - Amiodarone previously stopped due to elevated LFTs. Restarted 10/26  - admitted w/ hemodynamically unstable AFL w/ RVR requiring emergent DCCV  - now w/ concern for possible amiodarone pulmonary toxicity w/ elevated ESR 113 and abnormal HR chest CT w/ concerning findings - he does not tolerate AFL well w/ his HF and has had previous presentations w/ shock to due AFL. Most recent LVEF 20-25%. Mantanence of NSR is imperative. Will need to involve EP to see if any alternative rhythm control strategies, though suspect would be high risk for ablation given obesity and other comorbidities. May need to consider referral to [facility] for high risk AFL ablation.     2. Chronic Systolic  Heart Failure - NICM, LHC 2021 showed no CAD  - previous hospitalizations w/ CGS in setting of rapid AFib/Flutter requiring milrinone - most recent echo 8/23 EF 20-25%, RV mildly reduced - NYHA Class III - now ESRD, volume controlled by iHD - GDMT limited by CKD and hypotension requiring midodrine  - not candidate for ICD given obesity and dialysis   3. Acute Hypoxic Respiratory Failure - require BiPAP on admit, now on 4L [place]  - multifactorial, due to rapid AFL, PNA and ? possible amiodarone pulmonary toxicity - continue supp O2 - primary team to initiate abx   - will need EP to weigh in regarding discontinuation of amiodarone  - continue iHD for volume management  - follow response to abx. If fails to improve and c/w O2 requirements, would favor stopping  amiodarone and treating w/ steroids. Regardless, would prefer to get off amio, given not ideal due to young age.   4. ESRD - iHD per nephrology   5. Obesity - Body mass index is 55.64 kg/m.  - wt loss advised   6. IDA  - Hgb 8.7 - T-sat < 20%. Received Ferrlecit per primary team   7. OSA - continue CPAP QHS   Length of Stay: [address], PA-C  05/27/2022, 8:49 AM  Advanced Heart Failure Team Pager [phone] (M-F; 7a - 5p)  Please contact [facility] for night-coverage after hours (4p -7a ) and weekends on [email or web address]

## 2022-05-27 NOTE — Progress Notes (Signed)
RT note-Bipap placed on patient by nursing with increased shortness of breath, patient has been refusing medications and dialysis this morning. Continue to monitor.

## 2022-05-27 NOTE — Progress Notes (Signed)
  [facility] Progress Note   Subjective: sitting on side of bed. Doesn't want to go to HD now, wants to go later tonight or tomorrow.   Objective Vitals:   05/27/22 0500 05/27/22 0759 05/27/22 0800 05/27/22 0910  BP:   100/73 113/75  Pulse:  97 96 99  Resp:   18   Temp:   98.8 F (37.1 C)   TempSrc:   Oral   SpO2:  100% 100% 100%  Weight: (!) 186.1 kg     Height:       Physical Exam General: Obese male in NAD, lots of coughing, no ^wob Heart: HS distant D/T body habitus. S1,S2 RRR SR on monitor Lungs: CTAB slightly decreased in bases Abdomen: Obese, NABS Extremities:No LE edema Dialysis Access: TDC drsg intact    OP HD: [facility] TTS 4:15 425/A1.5x 180 kg 3K/2.5Ca TDC  - Heparin: None/HIT Ab POSITIVE - Mircera 120 q 4 wks (last 10/12) - Hectorol 6 TIW, Sensipar 30 TIW    CXR 11/1 - CM, vasc congestion  Assessment/Plan: AFlutter/RVR -  Now SR s/p cardioversion. On amiodarone. Per primary/cardiology.  Respiratory distress - required BiPAP after cardioversion. O2 sats fall with ambulation. Still using BIPAP off and on. Not sure wts are accurate, up 3-6 kg possibly. Not in distress today, but coughing a lot.  ESRD -  HD TTS. Refused HD this am. Will try again later today.  BP/Volume  - Chronic volume overload. Usually tolerates large UF goals. Midodrine 10 TID for BP support.  Anemia  - Hgb stable. On ESA as outpatient. Next dose 06/06/2022. Follow HGB.  Metabolic bone disease -  Continue Forsrenol binder/Sensipar/Hectorol  HFrEF 20-25%    Kelly Splinter, MD 05/27/2022, 12:25 PM  Recent Labs  Lab 05/26/22 0419 05/27/22 0334  HGB 9.1* 8.7*  ALBUMIN 3.5 3.3*  CALCIUM 8.9 9.2  PHOS 5.2* 4.8*  CREATININE 9.37* 10.86*  K 4.1 4.3    Inpatient medications:  amiodarone  200 mg Oral Daily   apixaban  5 mg Oral BID   Chlorhexidine Gluconate Cloth  6 each Topical Daily   doxycycline  100 mg Oral Q12H   etomidate  15 mg Intravenous Once   lanthanum  2,000 mg  Oral TID WC   midodrine  10 mg Oral TID WC   torsemide  100 mg Oral Once per day on Sun Mon Wed Fri    anticoagulant sodium citrate     cefTRIAXone (ROCEPHIN)  IV     furosemide     acetaminophen **OR** acetaminophen, polyethylene glycol, senna-docusate

## 2022-05-27 NOTE — Progress Notes (Signed)
I was called in by the nurse as pt was c/o shortness of breath, palpitations and feeling lightheaded. I went in to evaluate the patient and he was sitting at the corner of the bed with BIPAP on. Upon questioning pt endorsed he was SOB with O2 5L via Nasal cannula when he were trying to lay on his back. Respiratory therapist hooked pt up to the BIPAP. By the time I got to see the pt he denies SOB ,chest pain or palpitations.  BP 107/68, HR 97 and SPO2 100% with BIPAP. Pt were in sinus rhythm on the monitor.  I received the message again from the with concerns of pt being lightheaded, and HR x 120-130's. Pt agreed to take amiodarone as per RN which he refused earlier in the day. Pt were given Amiodarone 200 mg at 16:38. [name] HR went up to 140-150's and pt were feeling lightheaded and visual changes. I went in to evaluate the patient. He denies chest pain. Lungs CTA. HR 132 and SPO2 99 with BIPAP. EKG was ordered and Cardiology , Lyda Jester PA-C was consulted for recommendations. EKG showed Atrial flutter with HR 125.  [name] Sx highly likely from volume overload and as he did not go for HD today.  Alos worsening SOB could potentially be from newly diagnosed pneumonia. Pt will benefit from HD. Pt was asked if he would kile to consider HD. Pt agreed. Dialysis was contacted and they will try to get the pt for HD later today  Dr Hebert Soho was contacted again by Dr  Lisabeth Devoid after reviewing the EKG. Pt is laying on supine with BIPAP. On reevaluation pt denies CP, SOB or lightheadedness. We will continue to monitor and await for Cardiology recommendations.   Dr Lindalou Hose will review the chart and will get back. He thinks Pt may require cardioversion.

## 2022-05-27 NOTE — Evaluation (Signed)
Physical Therapy Evaluation Patient Details Name: Phillip Young MRN: [id number] DOB: Mar 08, 1986 Today's Date: 05/27/2022  History of Present Illness  36 year old presented 05/22/22 with an unstable tachyarrhythmia found to be hypotensive in atrial flutter with rapid ventricular response. S/p urgent synchronized cardioversion; volume overloaded with acute on chronic hypoxic respiratory failure requiring BiPAP; CT angio ruled out PE;   PMH morbid obesity, OSA on home BiPAP, obesity hypoventilation syndrome, chronic systolic heart failure due to nonischemic cardiomyopathy, dialysis dependent ESRD, and paroxysmal atrial fibrillation/flutter  Clinical Impression  Pt well known to me from prior hospitalization. Pt is moving well in room with good strength and balance. Did not feel well and opted to stay in room. Does not need skilled PT at this time. Will refer to mobility team for continued monitoring and encouragement to ambulate.        Recommendations for follow up therapy are one component of a multi-disciplinary discharge planning process, led by the attending physician.  Recommendations may be updated based on patient status, additional functional criteria and insurance authorization.  Follow Up Recommendations No PT follow up      Assistance Recommended at Discharge None  Patient can return home with the following       Equipment Recommendations None recommended by PT  Recommendations for Other Services       Functional Status Assessment Patient has not had a recent decline in their functional status     Precautions / Restrictions Restrictions Weight Bearing Restrictions: No      Mobility  Bed Mobility               General bed mobility comments: Sitting EOB    Transfers Overall transfer level: Modified independent Equipment used: None                    Ambulation/Gait Ambulation/Gait assistance: Modified independent (Device/Increase time) Gait Distance  (Feet): 10 Feet Assistive device: None Gait Pattern/deviations: Step-through pattern, Wide base of support       General Gait Details: Steady gait in room  Stairs            Wheelchair Mobility    Modified Rankin (Stroke Patients Only)       Balance Overall balance assessment: No apparent balance deficits (not formally assessed)                                           Pertinent Vitals/Pain      Home Living Family/patient expects to be discharged to:: Private residence Living Arrangements: Alone Available Help at Discharge: Family;Available PRN/intermittently Type of Home: House Home Access: Level entry       Home Layout: One level        Prior Function Prior Level of Function : Independent/Modified Independent             Mobility Comments: Not using assistive device       Hand Dominance   Dominant Hand: Right    Extremity/Trunk Assessment   Upper Extremity Assessment Upper Extremity Assessment: Overall WFL for tasks assessed    Lower Extremity Assessment Lower Extremity Assessment: Overall WFL for tasks assessed       Communication   Communication: No difficulties  Cognition Arousal/Alertness: Awake/alert Behavior During Therapy: WFL for tasks assessed/performed Overall Cognitive Status: Within Functional Limits for tasks assessed  General Comments General comments (skin integrity, edema, etc.): VSS on 5L O2    Exercises     Assessment/Plan    PT Assessment Patient does not need any further PT services  PT Problem List         PT Treatment Interventions      PT Goals (Current goals can be found in the Care Plan section)  Acute Rehab PT Goals PT Goal Formulation: With patient    Frequency       Co-evaluation               AM-PAC PT "6 Clicks" Mobility  Outcome Measure Help needed turning from your back to your side while in a flat  bed without using bedrails?: None Help needed moving from lying on your back to sitting on the side of a flat bed without using bedrails?: None Help needed moving to and from a bed to a chair (including a wheelchair)?: None Help needed standing up from a chair using your arms (e.g., wheelchair or bedside chair)?: None Help needed to walk in hospital room?: None Help needed climbing 3-5 steps with a railing? : None 6 Click Score: 24    End of Session Equipment Utilized During Treatment: Oxygen Activity Tolerance: Patient tolerated treatment well Patient left: in bed;with call bell/phone within reach (sitting EOB)   PT Visit Diagnosis: Other abnormalities of gait and mobility (R26.89)    Time: 9833-8250 PT Time Calculation (min) (ACUTE ONLY): 8 min   Charges:              [place] Office [place] 05/27/2022, 2:30 PM

## 2022-05-28 ENCOUNTER — Inpatient Hospital Stay (HOSPITAL_COMMUNITY): Payer: BC Managed Care – PPO | Admitting: Anesthesiology

## 2022-05-28 ENCOUNTER — Encounter (HOSPITAL_COMMUNITY): Payer: Self-pay | Admitting: Anesthesiology

## 2022-05-28 ENCOUNTER — Encounter (HOSPITAL_COMMUNITY)
Admission: EM | Disposition: A | Discharge status: Home / Self Care | Payer: Self-pay | Source: Home / Self Care | Attending: Internal Medicine

## 2022-05-28 DIAGNOSIS — J189 Pneumonia, unspecified organism: Secondary | ICD-10-CM | POA: Diagnosis not present

## 2022-05-28 DIAGNOSIS — I483 Typical atrial flutter: Secondary | ICD-10-CM

## 2022-05-28 DIAGNOSIS — Z20822 Contact with and (suspected) exposure to covid-19: Secondary | ICD-10-CM | POA: Diagnosis not present

## 2022-05-28 DIAGNOSIS — N186 End stage renal disease: Secondary | ICD-10-CM | POA: Diagnosis not present

## 2022-05-28 DIAGNOSIS — J9601 Acute respiratory failure with hypoxia: Secondary | ICD-10-CM | POA: Diagnosis not present

## 2022-05-28 DIAGNOSIS — I5023 Acute on chronic systolic (congestive) heart failure: Secondary | ICD-10-CM | POA: Diagnosis not present

## 2022-05-28 HISTORY — PX: CARDIOVERSION: SHX1299

## 2022-05-28 LAB — POCT I-STAT, CHEM 8
BUN: 40 mg/dL — ABNORMAL HIGH (ref 6–20)
Calcium, Ion: 1.06 mmol/L — ABNORMAL LOW (ref 1.15–1.40)
Chloride: 94 mmol/L — ABNORMAL LOW (ref 98–111)
Creatinine, Ser: 8.7 mg/dL — ABNORMAL HIGH (ref 0.61–1.24)
Glucose, Bld: 108 mg/dL — ABNORMAL HIGH (ref 70–99)
HCT: 25 % — ABNORMAL LOW (ref 39.0–52.0)
Hemoglobin: 8.5 g/dL — ABNORMAL LOW (ref 13.0–17.0)
Potassium: 4.1 mmol/L (ref 3.5–5.1)
Sodium: 133 mmol/L — ABNORMAL LOW (ref 135–145)
TCO2: 25 mmol/L (ref 22–32)

## 2022-05-28 LAB — T3, FREE: T3, Free: 1.8 pg/mL — ABNORMAL LOW (ref 2.0–4.4)

## 2022-05-28 LAB — CBC
HCT: 24.9 % — ABNORMAL LOW (ref 39.0–52.0)
Hemoglobin: 8.4 g/dL — ABNORMAL LOW (ref 13.0–17.0)
MCH: 34.7 pg — ABNORMAL HIGH (ref 26.0–34.0)
MCHC: 33.7 g/dL (ref 30.0–36.0)
MCV: 102.9 fL — ABNORMAL HIGH (ref 80.0–100.0)
Platelets: 245 10*3/uL (ref 150–400)
RBC: 2.42 MIL/uL — ABNORMAL LOW (ref 4.22–5.81)
RDW: 14.6 % (ref 11.5–15.5)
WBC: 14.4 10*3/uL — ABNORMAL HIGH (ref 4.0–10.5)
nRBC: 0 % (ref 0.0–0.2)

## 2022-05-28 LAB — HEPATIC FUNCTION PANEL
ALT: 27 U/L (ref 0–44)
AST: 21 U/L (ref 15–41)
Albumin: 3.4 g/dL — ABNORMAL LOW (ref 3.5–5.0)
Alkaline Phosphatase: 101 U/L (ref 38–126)
Bilirubin, Direct: 0.3 mg/dL — ABNORMAL HIGH (ref 0.0–0.2)
Indirect Bilirubin: 0.8 mg/dL (ref 0.3–0.9)
Total Bilirubin: 1.1 mg/dL (ref 0.3–1.2)
Total Protein: 8.3 g/dL — ABNORMAL HIGH (ref 6.5–8.1)

## 2022-05-28 SURGERY — CANCELLED PROCEDURE

## 2022-05-28 SURGERY — CARDIOVERSION
Anesthesia: General

## 2022-05-28 MED ORDER — METOPROLOL SUCCINATE ER 25 MG PO TB24
25.0000 mg | ORAL_TABLET | Freq: Two times a day (BID) | ORAL | Status: DC
  Administered 2022-05-28 – 2022-06-02 (×7): 25 mg via ORAL
  Filled 2022-05-28 (×8): qty 1

## 2022-05-28 MED ORDER — MIDAZOLAM HCL 2 MG/2ML IJ SOLN
INTRAMUSCULAR | Status: DC | PRN
  Administered 2022-05-28: 2 mg via INTRAVENOUS

## 2022-05-28 MED ORDER — SODIUM CHLORIDE 0.9 % IV SOLN: INTRAVENOUS | Status: DC | PRN

## 2022-05-28 MED ORDER — DARBEPOETIN ALFA 200 MCG/0.4ML IJ SOSY
200.0000 ug | PREFILLED_SYRINGE | INTRAMUSCULAR | Status: DC
  Administered 2022-05-29 – 2022-06-05 (×2): 200 ug via SUBCUTANEOUS
  Filled 2022-05-28 (×2): qty 0.4

## 2022-05-28 MED ORDER — METOPROLOL TARTRATE 12.5 MG HALF TABLET
12.5000 mg | ORAL_TABLET | Freq: Four times a day (QID) | ORAL | Status: DC
  Administered 2022-05-28: 12.5 mg via ORAL
  Filled 2022-05-28 (×2): qty 1

## 2022-05-28 MED ORDER — LIDOCAINE 2% (20 MG/ML) 5 ML SYRINGE
INTRAMUSCULAR | Status: DC | PRN
  Administered 2022-05-28: 80 mg via INTRAVENOUS

## 2022-05-28 MED ORDER — HEPARIN SODIUM (PORCINE) 1000 UNIT/ML IJ SOLN
INTRAMUSCULAR | Status: AC
  Filled 2022-05-28: qty 4

## 2022-05-28 MED ORDER — KETAMINE HCL 10 MG/ML IJ SOLN
INTRAMUSCULAR | Status: DC | PRN
  Administered 2022-05-28: 20 mg via INTRAVENOUS

## 2022-05-28 MED ORDER — MIDAZOLAM HCL 2 MG/2ML IJ SOLN
INTRAMUSCULAR | Status: AC
  Filled 2022-05-28: qty 2

## 2022-05-28 MED ORDER — METOPROLOL TARTRATE 25 MG PO TABS
25.0000 mg | ORAL_TABLET | Freq: Two times a day (BID) | ORAL | Status: DC
  Administered 2022-05-28: 25 mg via ORAL
  Filled 2022-05-28: qty 1

## 2022-05-28 MED ORDER — PHENYLEPHRINE HCL (PRESSORS) 10 MG/ML IV SOLN
INTRAVENOUS | Status: DC | PRN
  Administered 2022-05-28 (×2): 160 ug via INTRAVENOUS

## 2022-05-28 MED ORDER — KETAMINE HCL 50 MG/5ML IJ SOSY
PREFILLED_SYRINGE | INTRAMUSCULAR | Status: AC
  Filled 2022-05-28: qty 5

## 2022-05-28 MED ORDER — PROCHLORPERAZINE EDISYLATE 10 MG/2ML IJ SOLN
5.0000 mg | Freq: Once | INTRAMUSCULAR | Status: AC
  Administered 2022-05-28: 5 mg via INTRAVENOUS
  Filled 2022-05-28: qty 2

## 2022-05-28 NOTE — Significant Event (Signed)
Rapid Response Note   Reason for Call :  Second set of eyes While RRT was on unit, RN asked RRT to evaluate patient  Initial Focused Assessment:  Pt sitting on side of bed, AO. Breathing is unlabored. Lung sounds with faint crackles in the bases. Skin is warm, dry, pink. Pt denies trouble breathing or additional complaints at this time. Pt heart rate 126 bpm, irregular.   VS: T 98.24F, BP 93/72, HR 126, RR 22, SpO2 98% on 6L HFNC  Interventions:  No intervention from RRT  Plan of Care:  DCCV today, per HF team  Event Summary:  MD Notified: no acute change in pt, no MD notification Call Time: [place] Time: 7972 End Time: [place], RN

## 2022-05-28 NOTE — Progress Notes (Addendum)
Advanced Heart Failure Rounding Note  PCP-Cardiologist: [name], MD   Subjective:    Admitted w/ AFL w/ RVR + acute hypoxic respiratory failure due to PNA +/- possible APT.   10/28: emergent DCCV in ED>>NSR 11/2: reverted back to AFL w/ RVR,. PO amio discontinued. Started metoprolol 12.65m q6h. Abx started for PNA  Had iHD overnight. 2.9L volume removal.   Remains in AF/AFL w/ RVR 120s. BP normotensive.  Reports subjective improvement w/ abx, feels less SOB and cough improving, though he reports supp O2 rate was increased overnight, currently at 6L/min (up from 4L yesterday). O2 Sats 96%. WBC trending down, 16>>14K.  TSH/Free T4 nl.  BMP and HFTs pending.    Objective:   Weight Range: (!) 181.1 kg Body mass index is 54.15 kg/m.   Vital Signs:   Temp:  [98.5 F (36.9 C)-98.8 F (37.1 C)] 98.6 F (37 C) (11/03 0628) Pulse Rate:  [24-134] 118 (11/03 0628) Resp:  [16-36] 24 (11/03 0628) BP: (86-136)/(64-101) 124/68 (11/03 0628) SpO2:  [94 %-100 %] 97 % (11/03 0628) FiO2 (%):  [40 %] 40 % (11/03 0114) Weight:  [181.1 kg-186.1 kg] 181.1 kg (11/03 0628) Last BM Date : 05/27/22  Weight change: Filed Weights   05/28/22 0045 05/28/22 0550 05/28/22 0628  Weight: (!) 186.1 kg (!) 183.2 kg (!) 181.1 kg    Intake/Output:   Intake/Output Summary (Last 24 hours) at 05/28/2022 0710 Last data filed at 05/28/2022 0550 Gross per 24 hour  Intake 700.06 ml  Output 3450 ml  Net -2749.94 ml      Physical Exam    General:  obese young male, sitting up on side of bed. On 6L [place] but not increased WOB HEENT: Normal Neck: Supple. Thick neck, JVD not well visualized. Carotids 2+ bilat; no bruits. No lymphadenopathy or thyromegaly appreciated. Cor: PMI nondisplaced. Irregularly irregular rate & rhythm. No rubs, gallops or murmurs. Lungs: decreased BS at the bases bilaterally, no crackles  Abdomen: Obese, soft, nontender, nondistended. No hepatosplenomegaly. No bruits or  masses. Good bowel sounds. Extremities: No cyanosis, clubbing, rash, edema Neuro: Alert & orientedx3, cranial nerves grossly intact. moves all 4 extremities w/o difficulty. Affect pleasant   Telemetry   Afib w/ RVR 120s   EKG    No new EKG to review   Labs    CBC Recent Labs    05/27/22 0334 05/28/22 0251  WBC 16.1* 14.4*  HGB 8.7* 8.4*  HCT 25.8* 24.9*  MCV 102.0* 102.9*  PLT 245 2277  Basic Metabolic Panel Recent Labs    05/26/22 0419 05/27/22 0334  NA 134* 134*  K 4.1 4.3  CL 95* 93*  CO2 25 22  GLUCOSE 112* 109*  BUN 48* 62*  CREATININE 9.37* 10.86*  CALCIUM 8.9 9.2  PHOS 5.2* 4.8*   Liver Function Tests Recent Labs    05/26/22 0419 05/27/22 0334  ALBUMIN 3.5 3.3*   No results for input(s): "LIPASE", "AMYLASE" in the last 72 hours. Cardiac Enzymes No results for input(s): "CKTOTAL", "CKMB", "CKMBINDEX", "TROPONINI" in the last 72 hours.  BNP: BNP (last 3 results) Recent Labs    02/18/22 0154 03/26/22 1958 05/22/22 0732  BNP 373.8* 374.2* 373.1*    ProBNP (last 3 results) No results for input(s): "PROBNP" in the last 8760 hours.   D-Dimer No results for input(s): "DDIMER" in the last 72 hours. Hemoglobin A1C No results for input(s): "HGBA1C" in the last 72 hours. Fasting Lipid Panel No results for input(s): "CHOL", "  HDL", "LDLCALC", "TRIG", "CHOLHDL", "LDLDIRECT" in the last 72 hours. Thyroid Function Tests Recent Labs    05/27/22 1448  TSH 1.750    Other results:   Imaging    No results found.   Medications:     Scheduled Medications:  apixaban  5 mg Oral BID   Chlorhexidine Gluconate Cloth  6 each Topical Daily   doxycycline  100 mg Oral Q12H   etomidate  15 mg Intravenous Once   heparin sodium (porcine)       lanthanum  2,000 mg Oral TID WC   metoprolol tartrate  12.5 mg Oral Q6H   midodrine  10 mg Oral TID WC   torsemide  100 mg Oral Once per day on Sun Mon Wed Fri    Infusions:  anticoagulant sodium  citrate     cefTRIAXone (ROCEPHIN)  IV 2 g (05/27/22 1229)   furosemide      PRN Medications: acetaminophen **OR** acetaminophen, heparin sodium (porcine), polyethylene glycol, senna-docusate    Patient Profile   36 y/o male w/ chronic biventricular heart failure due to NICM, PAF/AFL s/p multiple DCCVs, multiple intolerances to amiodarone, ESRD on iHD, OSA on CPAP and morbid obesity, admitted for acute hypoxic respiratory failure and hypotension in the setting of recurrent ALF w/ RVR, requiring emergent DCCV in ED. Now w/ concern for potential amiodarone pulmonary toxicity. Also being treated for possible PNA.     Assessment/Plan   1. Recurrent Afib/AFL w/ RVR - s/p multiple DCCVs - Amiodarone previously stopped due to elevated LFTs. Restarted 10/26  - admitted w/ hemodynamically unstable AFL w/ RVR requiring emergent DCCV>>NSR - now w/ concern for possible amiodarone pulmonary toxicity w/ elevated ESR 113 and abnormal HR chest CT w/ concerning findings. Holding amio for now - back in AFL w/ RVR overnight  - he does not tolerate AFL well w/ his HF and has had previous presentations w/ shock to due AFL. Most recent LVEF 20-25%. Mantanence of NSR is imperative. Unfortunately no other alternative rhythm control strategies. D/w EP. Not candidate for Tikosyn w/ ESRD. Too high risk for ablation due to size and other comorbitiies - now on metoprolol 12.85m q6h; low doses due to chronic low output heart failure  - Keep NPO. Plan DCCV today  - continue Eliquis 5 mg bid       2. Chronic Systolic  Heart Failure - NICM, LHC 2021 showed no CAD  - previous hospitalizations w/ CGS in setting of rapid AFib/Flutter requiring milrinone - most recent echo 8/23 EF 20-25%, RV mildly reduced - NYHA Class III - now ESRD, volume controlled by iHD - GDMT limited by CKD and hypotension requiring midodrine  - not candidate for ICD given obesity and dialysis    3. Acute Hypoxic Respiratory Failure -  required BiPAP on admit, now on 6L [place]  - multifactorial, due to rapid AFL, PNA and ? possible amiodarone pulmonary toxicity - continue supp O2 - continue ceftriaxone + doxycyline per primary team  - continue iHD for volume management  - holding amiodarone for now - follow response to abx. If fails to improve and c/w O2 requirements, may need trial of steroids    4. ESRD - iHD per nephrology    5. Obesity - Body mass index is 55.64 kg/m.  - wt loss advised    6. IDA  - Hgb 8.7 - T-sat < 20%. Received Ferrlecit per primary team    7. OSA - continue CPAP QHS  8. PNA - continue  abx   Length of Stay: [address] PA-C  05/28/2022, 7:10 AM  Advanced Heart Failure Team Pager [phone] (M-F; 7a - 5p)  Please contact [facility] for night-coverage after hours (5p -7a ) and weekends on [email or web address]  Patient seen with PA, agree with the above note.  He was admitted with dyspnea, atrial flutter with RVR and initially cardioverted in the ER.  He had been restarted on amiodarone on 10/26 when he saw EP.    Atrial flutter has recurred post-initial DCCV.  Now in typical flutter with rate in 120s, has been as high as 150s.  He is on low dose metoprolol tartrate.   He denies dyspnea at rest.  Of note, after starting amiodarone, he felt like his breathing worsened.  ESR was 113 this admission with CT chest showing consolidation and nodularity, ?multifocal PNA versus amiodarone toxicity.  Amiodarone was stopped.   Patient had HD this morning.   General: NAD Neck: Thick, JVP difficult, no thyromegaly or thyroid nodule.  Lungs: Clear to auscultation bilaterally with normal respiratory effort. CV: Nondisplaced PMI.  Heart regular S1/S2, no S3/S4, no murmur.  Trace ankle edema.    Abdomen: Soft, nontender, no hepatosplenomegaly, no distention.  Skin: Intact without lesions or rashes.  Neurologic: Alert and oriented x 3.  Psych: Normal affect. Extremities: No clubbing or cyanosis.   HEENT: Normal.   Patient is back in atrial flutter with RVR.  Management is difficult, not Tikosyn candidate with ESRD and has likely had amiodarone toxicity.  Amiodarone was stopped in the past due to concern for hepatic toxicity.  Now, with worsening dyspnea and elevated ESR + infiltrates on CT chest, concern for possible amiodarone lung toxicity.  ESRD makes rate control with metoprolol difficult.  - He will need to stay off amiodarone.  - Transition to Toprol XL 25 mg bid.  - I think we need to reconsider atrial flutter ablation, understanding risk is high.  I do not think he will tolerate HR in 120s-150s at all well chronically with LV EF 20-25%.  He will be unlikely to stay in NSR long-term after DCCV without antiarrhythmic but there appears to be no option.  Will ask EP to see.  - I will repeat DCCV today to try to control him at least temporarily.  - Will ask pulmonary to see to comment on risk for amiodarone lung toxicity => should we start steroids?   [name] 05/28/2022 11:11 AM

## 2022-05-28 NOTE — Anesthesia Preprocedure Evaluation (Signed)
Anesthesia Evaluation  Patient identified by MRN, date of birth, ID band Patient awake    Reviewed: Allergy & Precautions, NPO status , Patient's Chart, lab work & pertinent test results  Airway Mallampati: IV  TM Distance: >3 FB Neck ROM: Full    Dental  (+) Dental Advisory Given, Poor Dentition, Missing   Pulmonary shortness of breath, sleep apnea , former smoker   Pulmonary exam normal breath sounds clear to auscultation       Cardiovascular hypertension, +CHF  + dysrhythmias Atrial Fibrillation  Rhythm:Irregular Rate:Abnormal  TTE 2022 1. Left ventricular ejection fraction, by estimation, is 25 to 30%. The  left ventricle has severely decreased function. The left ventricle  demonstrates global hypokinesis. The left ventricular internal cavity size  was moderately dilated. Left  ventricular diastolic parameters are indeterminate.  2. Right ventricular systolic function is normal. The right ventricular  size is normal. There is moderately elevated pulmonary artery systolic  pressure. The estimated right ventricular systolic pressure is 76.1 mmHg.  3. The mitral valve is normal in structure. Trivial mitral valve  regurgitation. No evidence of mitral stenosis.  4. The aortic valve is normal in structure. Aortic valve regurgitation is  not visualized. No aortic stenosis is present.  5. The inferior vena cava is normal in size with greater than 50%  respiratory variability, suggesting right atrial pressure of 3 mmHg.    Neuro/Psych  PSYCHIATRIC DISORDERS  Depression    negative neurological ROS     GI/Hepatic Neg liver ROS,GERD  ,,  Endo/Other    Morbid obesity  Renal/GU ESRF and DialysisRenal disease     Musculoskeletal  (+) Arthritis ,    Abdominal   Peds  Hematology  (+) Blood dyscrasia (on eliquis), anemia Lab Results      Component                Value               Date                      WBC                       8.5                 05/04/2021                HGB                      10.6 (L)            05/04/2021                HCT                      33.5 (L)            05/04/2021                MCV                      96.5                05/04/2021                PLT                      406 (H)  05/04/2021              Anesthesia Other Findings   Reproductive/Obstetrics                             Anesthesia Physical Anesthesia Plan  ASA: 4  Anesthesia Plan: General   Post-op Pain Management: Minimal or no pain anticipated   Induction: Intravenous and Rapid sequence  PONV Risk Score and Plan: 2 and Ondansetron and Treatment may vary due to age or medical condition  Airway Management Planned: Mask  Additional Equipment:   Intra-op Plan:   Post-operative Plan:   Informed Consent: I have reviewed the patients History and Physical, chart, labs and discussed the procedure including the risks, benefits and alternatives for the proposed anesthesia with the patient or authorized representative who has indicated his/her understanding and acceptance.     Dental advisory given  Plan Discussed with: CRNA  Anesthesia Plan Comments:         Anesthesia Quick Evaluation

## 2022-05-28 NOTE — Consult Note (Signed)
   [facility] Inpatient Consult   05/28/2022  Nemiah Kissner [birth year] [id number]  [facility] [facility] [ACO] Patient: [employer] Blue [employer] Comm PPO  Primary Care Provider:  [name], MD, [facility] Internal Medicine, which is listed to provide the transition of care   Patient screened for hospitalization with noted high risk score and length of stay for unplanned readmission risk  and to assess for potential [place] Management service needs for post hospital transition for care coordination.  Review of patient's electronic medical record reveals patient is post procedure on rounds.  Plan:  Continue to follow progress and disposition to assess for post hospital community care coordination/management needs.  Will follow for a referral request for community care coordination as needed.  Of note, [facility] Care Management/Population Health does not replace or interfere with any arrangements made by the Inpatient Transition of Care team.  For questions contact:   Natividad Brood, RN BSN [place]  [phone] business mobile phone Toll free office [phone]  *[place]  [phone] Fax number: [phone] [email or web address]

## 2022-05-28 NOTE — Transfer of Care (Signed)
Immediate Anesthesia Transfer of Care Note  Patient: Phillip Young  Procedure(s) Performed: CARDIOVERSION  Patient Location: PACU  Anesthesia Type:MAC  Level of Consciousness: drowsy  Airway & Oxygen Therapy: Patient Spontanous Breathing and Patient connected to nasal cannula oxygen  Post-op Assessment: Report given to RN and Post -op Vital signs reviewed and stable  Post vital signs: Reviewed and stable  Last Vitals:  Vitals Value Taken Time  BP 108/80   Temp    Pulse 93   Resp 29   SpO2 97     Last Pain:  Vitals:   05/28/22 1305  TempSrc: Temporal  PainSc: 0-No pain      Patients Stated Pain Goal: 0 (05/21/24 3664)  Complications: No notable events documented.

## 2022-05-28 NOTE — Progress Notes (Addendum)
[facility] Progress Note   Subjective:  Seen in room - hypotensive/tachycardic, but asymptomatic. For cardioversion today and dialysis tomorrow. Using O2, no CP at the moment. Denies issues with HD last night - 2.9L UFG.  Objective Vitals:   05/28/22 0628 05/28/22 0905 05/28/22 0917 05/28/22 0926  BP: 124/68 (!) 81/62 (!) 84/68 93/72  Pulse: (!) 118 (!) 112 (!) 109   Resp: (!) 24   (!) 22  Temp: 98.6 F (37 C)     TempSrc: Oral     SpO2: 97% 99%  98%  Weight: (!) 181.1 kg     Height:       Physical Exam General: Well appearing, overweight man, NAD. Nasal O2 in place Heart: Mild tachycardia, no murmur Lungs: CTAB; no rales or wheezing Abdomen: soft Extremities: Trace BLE edema Dialysis Access: TDC in R chest  Additional Objective Labs: Basic Metabolic Panel: Recent Labs  Lab 05/25/22 0434 05/26/22 0419 05/27/22 0334  NA 134* 134* 134*  K 4.6 4.1 4.3  CL 96* 95* 93*  CO2 18* 25 22  GLUCOSE 100* 112* 109*  BUN 74* 48* 62*  CREATININE 12.58* 9.37* 10.86*  CALCIUM 9.0 8.9 9.2  PHOS 7.3* 5.2* 4.8*   Liver Function Tests: Recent Labs  Lab 05/26/22 0419 05/27/22 0334 05/28/22 0649  AST  --   --  21  ALT  --   --  27  ALKPHOS  --   --  101  BILITOT  --   --  1.1  PROT  --   --  8.3*  ALBUMIN 3.5 3.3* 3.4*   CBC: Recent Labs  Lab 05/24/22 0315 05/25/22 0434 05/26/22 0419 05/27/22 0334 05/28/22 0251  WBC 11.9* 12.1* 14.0* 16.1* 14.4*  HGB 9.7* 9.6* 9.1* 8.7* 8.4*  HCT 27.8* 27.7* 26.4* 25.8* 24.9*  MCV 100.7* 100.4* 102.3* 102.0* 102.9*  PLT 227 230 243 245 245   Studies/Results: CT Chest High Resolution  Result Date: 05/27/2022 CLINICAL DATA:  Shortness of breath EXAM: CT CHEST WITHOUT CONTRAST TECHNIQUE: Multidetector CT imaging of the chest was performed following the standard protocol without intravenous contrast. High resolution imaging of the lungs, as well as inspiratory and expiratory imaging, was performed. RADIATION DOSE  REDUCTION: This exam was performed according to the departmental dose-optimization program which includes automated exposure control, adjustment of the mA and/or kV according to patient size and/or use of iterative reconstruction technique. COMPARISON:  Chest CT dated May 22, 2022 FINDINGS: Cardiovascular: Marked cardiomegaly. No pericardial effusion. No coronary artery calcifications. Normal caliber thoracic aorta with mild calcified plaque. Dilated main pulmonary artery, measuring up to 3.2 cm. Mediastinum/Nodes: Esophagus and thyroid are unremarkable. Enlarged mediastinal and hilar lymph nodes, unchanged when compared with recent prior. Reference periaortic lymph node measuring 2.3 cm in short axis on series 5, image 64. Reference right hilar lymph node measuring 1.8 cm in short axis on series 5, image 90. Lungs/Pleura: Central airways are patent. Bilateral mosaic attenuation. New bilateral masslike consolidations clustered areas of nodularity. Septal thickening and diffuse ground-glass opacities. Upper Abdomen: No acute abnormality. Musculoskeletal: No chest wall mass or suspicious bone lesions identified. IMPRESSION: 1. New bilateral masslike consolidations and clustered areas of nodularity, findings are favored to be due to multifocal pneumonia. Inflammatory etiology such as drug toxicity is an additional consideration, however there are no specific findings to suggest amiodarone toxicity. 2. Diffuse bilateral ground-glass opacities and septal thickening, likely due to superimposed pulmonary edema. 3. Unchanged mediastinal and bilateral hilar lymphadenopathy, 4. Mosaic  attenuation, which can be seen in the setting of small airways disease or pulmonary hypertension. 5. Dilated main pulmonary artery which can be seen in the setting of pulmonary hypertension. 6. Marked cardiomegaly. 7. Aortic Atherosclerosis (ICD10-I70.0). Electronically Signed   By: Yetta Glassman M.D.   On: 05/27/2022 08:21     Medications:  cefTRIAXone (ROCEPHIN)  IV 2 g (05/28/22 0902)   furosemide      apixaban  5 mg Oral BID   Chlorhexidine Gluconate Cloth  6 each Topical Daily   doxycycline  100 mg Oral Q12H   etomidate  15 mg Intravenous Once   heparin sodium (porcine)       lanthanum  2,000 mg Oral TID WC   metoprolol tartrate  12.5 mg Oral Q6H   midodrine  10 mg Oral TID WC   torsemide  100 mg Oral Once per day on Sun Mon Wed Fri    Dialysis Orders: [facility] TTS 4:15 425/A1.5x 180 kg 3K/2.5Ca TDC  - Heparin: None/HIT Ab POSITIVE - Mircera 120 q 4 wks (last 10/12) - Hectorol 6 TIW, Sensipar 30 TIW   Assessment/Plan: A-Flutter/RVR: S/p cardioversion 10/28 - initially did ok on amiodarone. Reverted back on 11/2 - changed from amiod to PO metoprolol with plan for repeat DCCV today. Respiratory distress - required BiPAP after cardioversion. O2 sats fall with ambulation. Still using BIPAP off and on. Needs ongoing UF with dialysis. Pneumonia: Per chest CT - covering with IV Ceftriaxone. WBC improving. ESRD: Continue HD per usual TTS schedule - next tomorrow. BP/Volume: Chronic volume overload. Usually tolerates large UF goals. Midodrine 10mg  TID for BP support. BP lower today with addition of metoprolol - as above. Anemia : Hgb 8.4 - will give Aranesp tomorrow with HD. Transfuse if < 7. Metabolic bone disease -  Continue Forsrenol binder/Sensipar/Hectorol  HFrEF 20-25%   Veneta Penton, PA-C 05/28/2022, 11:45 AM  [employer]

## 2022-05-28 NOTE — Progress Notes (Signed)
   05/28/22 0550  Vitals  Temp 98.6 F (37 C)  Temp Source Oral  BP (!) 136/100  MAP (mmHg) (!) 1  BP Location Left Wrist  BP Method Automatic  Patient Position (if appropriate) Sitting  Pulse Rate (!) 116  Pulse Rate Source Monitor  ECG Heart Rate (!) 116  Resp (!) 27  Post Treatment  Dialyzer Clearance Heavily streaked  Duration of HD Treatment -hour(s) 4 hour(s)  Liters Processed 96  Fluid Removed 2900 mL  Tolerated HD Treatment Yes  Post-Hemodialysis Comments out in wheel chair.   TX fin w/ low bp trends. Tachy all the while, afib as well.

## 2022-05-28 NOTE — Progress Notes (Signed)
Patient transported to dialysis via chair and 2 transporters. Bipap taken down by RT. Patient A&Ox4, displays no s/s of distress, and placed on 5.5L of oxygen for transport. Bed linen changed, trash removed, and table sanitized.

## 2022-05-28 NOTE — Progress Notes (Signed)
Phillip Young has continued to have a. Fib with RVR today and into the evening. He has been symptomatic with fluttering in his chest but denies any other symptoms. Day team discussed with cardiology who recommended metoprolol tartrate 12.5 mg q6h and NPO tonight for possible cardioversion today. He has no signs of poor perfusion at this time, lower extremities are warm to touch and blood pressure is stable.   Discussed with patient at bed. If becomes hypotensive or evidence of poor perfusion on exam tomorrow can DC metoprolol and plan for DCCV per cardiology recommendations

## 2022-05-28 NOTE — Consult Note (Signed)
ELECTROPHYSIOLOGY CONSULT NOTE    Patient ID: Phillip Young MRN: [id number], DOB/AGE: April 21, 1986 36 y.o.  Admit date: 05/22/2022 Date of Consult: 05/28/2022  Primary Physician: [name], MD Primary Cardiologist: [name], MD  Electrophysiologist: Dr. Curt Bears   Referring Provider: Dr. [name]  Patient Profile: Phillip Young is a 36 y.o. male with a history of NICM, biventricular HF, PAF/AFL s/p multiple DCCVs, multiple intolerance to amiodarone, ESRD on iHD, OSA on CPAP, and morbid obesity who is being seen today for the evaluation of poorly controlled AFL/AF at the request of Dr. [name].  HPI:  Phillip Young is a 36 y.o. male with medical history as above.   Seen by Dr. Curt Bears 05/20/2022. With no other AAD options due to his co-morbidities, it was recommended he restart amiodarone for his AFL despite LFT elevation in the past. Amio was started at that visit and planned for [facility].   He presented to [facility]/28 with worsening SOB. He c/o weight gain, DOE, and fatigue. Denied CP and palpitations, but felt worse overall in AFL. With hypotension, he was urgently cardioverted. iHD resumed.   Of note, ESR was elevated to 113 and chest CT showed new bilateral mass like consolidations and cluster areas of nodularity.  Findings favored to be multifocal PNA, though inflammatory etiology such as amiodarone drug toxicity also consideration.    Started on ABx with WBC 16k, brown/rust colored sputum. Weaned off BiPap as tolerated.    He maintained NSR s/p [place] until 11/2.  EP asked to see to discuss if felt to be a candidate for potential AFL ablation alone, given his co-morbidities, or if felt too high risk overall for EP procedures.   Potassium4.1 (11/03 1336)   Creatinine, ser  8.70* (11/03 1336) PLT  245 (11/03 0251) HGB  8.5* (11/03 1336) WBC 14.4* (11/03 0251)  .    Feeling OK at rest s/p [facility]. He does feel his heart racing from time to time, and has a decent hold on when  he is in and out of rhythm. He is interested in procedures, even being aware of high risk.    Past Medical History:  Diagnosis Date   Acute on chronic respiratory failure with hypoxia ([place]) 04/21/2021   Acute on chronic systolic (congestive) heart failure ([name]) 02/26/2020   Biventricular congestive heart failure ([place])    Last Echo 11/2019 at [facility] reveals EF 20%   Class 3 severe obesity due to excess calories with serious comorbidity and body mass index (BMI) of 50.0 to 59.9 in adult [facility]) 02/26/2020   Essential hypertension 02/26/2020   GERD without esophagitis 02/26/2020   Hidradenitis suppurativa 02/26/2020   OSA (obstructive sleep apnea) 02/26/2020   Prediabetes 02/26/2020   Renal disorder      Surgical History:  Past Surgical History:  Procedure Laterality Date   ABSCESS DRAINAGE     AV FISTULA PLACEMENT Left 08/21/2021   Procedure: LEFT ARM ARTERIOVENOUS (AV) FISTULA.;  Surgeon: Serafina Mitchell, MD;  Location: [place];  Service: Vascular;  Laterality: Left;   CARDIOVERSION N/A 10/09/2021   Procedure: CARDIOVERSION;  Surgeon: Larey Dresser, MD;  Location: [place];  Service: Cardiovascular;  Laterality: N/A;   IR FLUORO GUIDE CV LINE RIGHT  03/10/2021   IR FLUORO GUIDE CV LINE RIGHT  04/22/2021   IR FLUORO GUIDE CV LINE RIGHT  08/20/2021   IR US GUIDE VASC ACCESS RIGHT  03/10/2021   IR US GUIDE VASC ACCESS RIGHT  04/22/2021   RIGHT HEART CATH  N/A 03/06/2021   Procedure: RIGHT HEART CATH;  Surgeon: Jolaine Artist, MD;  Location: [place] CV LAB;  Service: Cardiovascular;  Laterality: N/A;   RIGHT/LEFT HEART CATH AND CORONARY ANGIOGRAPHY N/A 03/04/2020   Procedure: RIGHT/LEFT HEART CATH AND CORONARY ANGIOGRAPHY;  Surgeon: Larey Dresser, MD;  Location: [place] CV LAB;  Service: Cardiovascular;  Laterality: N/A;   TEE WITHOUT CARDIOVERSION N/A 05/05/2021   Procedure: TRANSESOPHAGEAL ECHOCARDIOGRAM (TEE);  Surgeon: Larey Dresser, MD;  Location: [facility] ENDOSCOPY;   Service: Cardiovascular;  Laterality: N/A;   TEMPORARY DIALYSIS CATHETER  03/06/2021   Procedure: TEMPORARY DIALYSIS CATHETER;  Surgeon: Jolaine Artist, MD;  Location: [place] CV LAB;  Service: Cardiovascular;;     Medications Prior to Admission  Medication Sig Dispense Refill Last Dose   amiodarone (PACERONE) 200 MG tablet Take 2 tablets (400 mg total) TWICE daily for 2 weeks, then take 1 tablet (200 mg total) TWICE daily for 2 weeks, then take 1 tablet (200 mg total) ONCE daily 90 tablet 3 05/21/2022   apixaban (ELIQUIS) 5 MG TABS tablet Take 1 tablet (5 mg total) by mouth 2 (two) times daily. 180 tablet 3 05/21/2022 at 1700   diclofenac Sodium (VOLTAREN) 1 % GEL Apply 4 g topically 3 (three) times daily as needed (joint pains). 150 g 0 Past Week   lanthanum (FOSRENOL) 1000 MG chewable tablet Chew 2,000 mg by mouth 3 (three) times daily with meals.   05/21/2022   Menthol (HALLS COUGH DROPS) 5.8 MG LOZG Use as directed 1 lozenge in the mouth or throat every 4 (four) hours as needed (cough).   05/21/2022    Inpatient Medications:   [MAR Hold] apixaban  5 mg Oral BID   [MAR Hold] Chlorhexidine Gluconate Cloth  6 each Topical Daily   [MAR Hold] darbepoetin (ARANESP) injection - DIALYSIS  200 mcg Subcutaneous Q Sat-1800   [MAR Hold] doxycycline  100 mg Oral Q12H   heparin sodium (porcine)       [MAR Hold] lanthanum  2,000 mg Oral TID WC   metoprolol succinate  25 mg Oral BID   [MAR Hold] midodrine  10 mg Oral TID WC   [MAR Hold] torsemide  100 mg Oral Once per day on Sun Mon Wed Fri    Allergies:  Allergies  Allergen Reactions   Coreg [Carvedilol] Shortness Of Breath and Diarrhea    Wheezing    Heparin Other (See Comments)    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, SRA positive    Social History   Socioeconomic History   Marital status: Single    Spouse name: Not on file   Number of children: Not on file   Years of education: Not on file   Highest education level: Not on file   Occupational History   Not on file  Tobacco Use   Smoking status: Former    Packs/day: 1.00    Types: Cigarettes    Quit date: 2019    Years since quitting: 4.8    Passive exposure: Current   Smokeless tobacco: Former   Tobacco comments:    quit in 2019  Vaping Use   Vaping Use: Never used  Substance and Sexual Activity   Alcohol use: Never   Drug use: Never   Sexual activity: Not on file  Other Topics Concern   Not on file  Social History Narrative   Not on file   Social Determinants of Health   Financial Resource Strain: Low Risk  (04/13/2021)  Overall Financial Resource Strain (CARDIA)    Difficulty of Paying Living Expenses: Not very hard  Food Insecurity: No Food Insecurity (05/23/2022)   Hunger Vital Sign    Worried About Running Out of Food in the Last Year: Never true    Ran Out of Food in the Last Year: Never true  Transportation Needs: No Transportation Needs (05/23/2022)   PRAPARE - Hydrologist (Medical): No    Lack of Transportation (Non-Medical): No  Physical Activity: Not on file  Stress: Not on file  Social Connections: Not on file  Intimate Partner Violence: Not At Risk (05/23/2022)   Humiliation, Afraid, Rape, and Kick questionnaire    Fear of Current or Ex-Partner: No    Emotionally Abused: No    Physically Abused: No    Sexually Abused: No     Family History  Problem Relation Age of Onset   Heart disease Mother    Hypertension Mother    Pulmonary Hypertension Mother    Drug abuse Father        died due to Heroin overdose     Review of Systems: All other systems reviewed and are otherwise negative except as noted above.  Physical Exam: Vitals:   05/28/22 1420 05/28/22 1425 05/28/22 1435 05/28/22 1442  BP: 108/79 1_0  Pulse: 92 93 (!) 103 94  Resp: (!) 31 (!) 31 (!) 32 (!) 26  Temp:      TempSrc:      SpO2: 97% 98% 92% 98%  Weight:      Height:        GEN- The patient is well  appearing, alert and oriented x 3 today.   HEENT: normocephalic, atraumatic; sclera clear, conjunctiva pink; hearing intact; oropharynx clear; neck supple Lungs- Clear to ausculation bilaterally, normal work of breathing.  No wheezes, rales, rhonchi Heart- Regular rate and rhythm, no murmurs, rubs or gallops GI- soft, non-tender, non-distended, bowel sounds present Extremities- no clubbing, cyanosis, or edema; DP/PT/radial pulses 2+ bilaterally MS- no significant deformity or atrophy Skin- warm and dry, no rash or lesion Psych- euthymic mood, full affect Neuro- strength and sensation are intact  Labs:   Lab Results  Component Value Date   WBC 14.4 (H) 05/28/2022   HGB 8.5 (L) 05/28/2022   HCT 25.0 (L) 05/28/2022   MCV 102.9 (H) 05/28/2022   PLT 245 05/28/2022    Recent Labs  Lab 05/27/22 0334 05/28/22 0649 05/28/22 1336  NA 134*  --  133*  K 4.3  --  4.1  CL 93*  --  94*  CO2 22  --   --   BUN 62*  --  40*  CREATININE 10.86*  --  8.70*  CALCIUM 9.2  --   --   PROT  --  8.3*  --   BILITOT  --  1.1  --   ALKPHOS  --  101  --   ALT  --  27  --   AST  --  21  --   GLUCOSE 109*  --  108*      Radiology/Studies: CT Chest High Resolution  Result Date: 05/27/2022 CLINICAL DATA:  Shortness of breath EXAM: CT CHEST WITHOUT CONTRAST TECHNIQUE: Multidetector CT imaging of the chest was performed following the standard protocol without intravenous contrast. High resolution imaging of the lungs, as well as inspiratory and expiratory imaging, was performed. RADIATION DOSE REDUCTION: This exam was performed according to the departmental dose-optimization program which  includes automated exposure control, adjustment of the mA and/or kV according to patient size and/or use of iterative reconstruction technique. COMPARISON:  Chest CT dated May 22, 2022 FINDINGS: Cardiovascular: Marked cardiomegaly. No pericardial effusion. No coronary artery calcifications. Normal caliber thoracic aorta  with mild calcified plaque. Dilated main pulmonary artery, measuring up to 3.2 cm. Mediastinum/Nodes: Esophagus and thyroid are unremarkable. Enlarged mediastinal and hilar lymph nodes, unchanged when compared with recent prior. Reference periaortic lymph node measuring 2.3 cm in short axis on series 5, image 64. Reference right hilar lymph node measuring 1.8 cm in short axis on series 5, image 90. Lungs/Pleura: Central airways are patent. Bilateral mosaic attenuation. New bilateral masslike consolidations clustered areas of nodularity. Septal thickening and diffuse ground-glass opacities. Upper Abdomen: No acute abnormality. Musculoskeletal: No chest wall mass or suspicious bone lesions identified. IMPRESSION: 1. New bilateral masslike consolidations and clustered areas of nodularity, findings are favored to be due to multifocal pneumonia. Inflammatory etiology such as drug toxicity is an additional consideration, however there are no specific findings to suggest amiodarone toxicity. 2. Diffuse bilateral ground-glass opacities and septal thickening, likely due to superimposed pulmonary edema. 3. Unchanged mediastinal and bilateral hilar lymphadenopathy, 4. Mosaic attenuation, which can be seen in the setting of small airways disease or pulmonary hypertension. 5. Dilated main pulmonary artery which can be seen in the setting of pulmonary hypertension. 6. Marked cardiomegaly. 7. Aortic Atherosclerosis (ICD10-I70.0). Electronically Signed   By: Yetta Glassman M.D.   On: 05/27/2022 08:21   DG Chest Port 1 View  Result Date: 05/26/2022 CLINICAL DATA:  sob EXAM: PORTABLE CHEST - 1 VIEW COMPARISON:  05/22/2022 FINDINGS: Stable tunneled right IJ hemodialysis catheter to the proximal RA. Suspect mild central pulmonary vascular congestion, increased since previous. Heart size upper limits normal for technique. No effusion.  Obesity. Visualized bones unremarkable. IMPRESSION: Mild central pulmonary vascular congestion.  Electronically Signed   By: Lucrezia Europe M.D.   On: 05/26/2022 10:21   CT Angio Chest PE W and/or Wo Contrast  Result Date: 05/22/2022 CLINICAL DATA:  Chest pain and shortness of breath. Evaluate for pulmonary embolism. EXAM: CT ANGIOGRAPHY CHEST WITH CONTRAST TECHNIQUE: Multidetector CT imaging of the chest was performed using the standard protocol during bolus administration of intravenous contrast. Multiplanar CT image reconstructions and MIPs were obtained to evaluate the vascular anatomy. RADIATION DOSE REDUCTION: This exam was performed according to the departmental dose-optimization program which includes automated exposure control, adjustment of the mA and/or kV according to patient size and/or use of iterative reconstruction technique. CONTRAST:  42m OMNIPAQUE IOHEXOL 350 MG/ML SOLN COMPARISON:  August 18, 2021 CT of the chest. Chest x-ray May 22, 2022. FINDINGS: Cardiovascular: The thoracic aorta is normal in caliber. No aneurysm or dissection identified. No atherosclerotic change. Cardiomegaly is stable. No pulmonary artery dilatation identified. The attenuation of the opacified pulmonary artery is 245 Hounsfield units demonstrating a moderate quality study. The main and lobar pulmonary arteries are well opacified. Evaluation at the level of the segmental pulmonary arteries and distal is limited due to artifact. Within this limitation, no pulmonary emboli are identified. Mediastinum/Nodes: Mediastinal adenopathy remains a right paratracheal node on series 8, image 44 measures 2 cm in diameter today, stable. An AP window node on series 8, image 48 measures 2.4 cm today versus 2.2 cm on the August 18, 2021 study and 2.2 cm on the October 09, 2020 study. Bilateral hilar adenopathy is similar in the interval. Overall, the adenopathy is stable to slightly more prominent in  the interval. The thyroid and esophagus are normal. No pleural effusions or pericardial effusions noted. Bilateral gynecomastia is  identified. No other chest wall abnormalities are noted. Lungs/Pleura: Evaluation of the lungs is limited due to patient body habitus. Diffuse ground-glass opacity is identified in the lungs with a few regions of decreased attenuation, likely air trapping. A calcified nodules identified in the left lung base, of no significance. No suspicious pulmonary nodules or masses are identified. Upper Abdomen: No acute abnormality. Musculoskeletal: No chest wall abnormality. No acute or significant osseous findings. Review of the MIP images confirms the above findings. IMPRESSION: 1. No pulmonary emboli identified in the main or lobar pulmonary arteries. Evaluation at and beyond the segmental level is limited. 2. Cardiomegaly. 3. Diffuse ground-glass throughout the lungs suggest pulmonary venous congestion. Regions of decreased attenuation suggests air trapping as well. Recommend clinical correlation. 4. Adenopathy in the mediastinum and hila is stable to mildly more prominent the interval. This finding is nonspecific but has been present since February 19, 2020. Electronically Signed   By: Dorise Bullion III M.D.   On: 05/22/2022 11:07   DG Chest 2 View  Result Date: 05/22/2022 CLINICAL DATA:  36 year old male with chest pain and shortness of breath. EXAM: CHEST - 2 VIEW COMPARISON:  Chest radiographs 03/26/2022 and earlier. FINDINGS: PA and lateral views at 0818 hours. Stable cardiomegaly and mediastinal contours. Stable right chest dual lumen dialysis type catheter. Visualized tracheal air column is within normal limits. Trace fluid now in the pleural fissures appears new since September. But pulmonary vascularity appears stable. No dependent pleural effusion. No consolidation or confluent opacity. No acute osseous abnormality identified. Negative visible bowel gas. IMPRESSION: Chronic cardiomegaly with trace new fluid in the pleural fissures. Stable pulmonary vascularity but still, consider mild or developing  interstitial edema. Electronically Signed   By: Genevie Ann M.D.   On: 05/22/2022 08:42    EKG: post cardioversion shows NSR/sinus tach in 90-100s (personally reviewed)  EKG 05/27/2022 shows Atrial flutter at 124 bpm  TELEMETRY: Atrial flutter 120-130s started yesterday evening, until cardioversion today. Now NSR/Sinus tach  (personally reviewed)  Assessment/Plan: 1. Recurrent Afib 2. AFL w/ RVR S/p multiple DCCVs Amiodarone previously stopped due to elevated LFTs and now with concern for pulmonary toxicity with elevated ESR of 113 and chest CT w concerning findings.  Amio is on hold for now.  He has no other AAD options given his EF and ESRD.  Continue Eliquis 5 mg bid  Asked to weigh in if felt to be candidate for high risk flutter ablation (potentially alone) or if we would recommend transfer to tertiary center for consideration there.  Will need to discuss among EP providers, likely at AM meeting on Monday.  He understands he is very high risk for general anaesthesia.    2. Chronic Systolic  Heart Failure NICM, LHC 2021 showed no CAD  Previous hospitalizations w/ [facility] in setting of rapid AFib/Flutter requiring milrinone Echo 8/23 EF 20-25%, RV mildly reduced NYHA Class III Now ESRD, volume controlled by iHD GDMT limited by CKD and hypotension requiring midodrine  Not candidate for ICD given obesity and dialysis    3. Acute Hypoxic Respiratory Failure 4. PNA Required BiPAP on admit, now on 6L   Multifactorial, due to rapid AFL, PNA and ? possible amiodarone pulmonary toxicity Continue supp O2 Continue ceftriaxone + doxycyline per primary team  Holding amiodarone for now   5. ESRD - iHD per nephrology    6. Obesity Body mass  index is 54.15 kg/m.   Dr. Myles Gip has seen. EPMDs will discuss at meeting on Monday morning as to whether or not we can consider him for high risk ablation here.  For questions or updates, please contact [place] Please consult [email or web address] for  contact info under Cardiology/STEMI.  Jacalyn Lefevre, PA-C  05/28/2022 2:48 PM

## 2022-05-28 NOTE — Procedures (Signed)
Electrical Cardioversion Procedure Note Phillip Young [id number] 04-17-1986  Procedure: Electrical Cardioversion Indications:  Atrial Flutter  Procedure Details Consent: Risks of procedure as well as the alternatives and risks of each were explained to the (patient/caregiver).  Consent for procedure obtained. Time Out: Verified patient identification, verified procedure, site/side was marked, verified correct patient position, special equipment/implants available, medications/allergies/relevent history reviewed, required imaging and test results available.  Performed  Patient placed on cardiac monitor, pulse oximetry, supplemental oxygen as necessary.  Sedation given:  Propofol per anesthesiology Pacer pads placed anterior and posterior chest.  Cardioverted 2 times with sternal pressure.  Cardioverted at [place].  Evaluation Findings: Post procedure EKG shows: NSR Complications: None Patient did tolerate procedure well.   [name] 05/28/2022, 2:08 PM

## 2022-05-28 NOTE — Anesthesia Procedure Notes (Signed)
Procedure Name: General with mask airway Date/Time: 05/28/2022 2:12 PM  Performed by: Erick Colace, CRNAPre-anesthesia Checklist: Patient identified, Emergency Drugs available, Suction available and Patient being monitored Patient Re-evaluated:Patient Re-evaluated prior to induction Oxygen Delivery Method: Ambu bag Preoxygenation: Pre-oxygenation with 100% oxygen Induction Type: IV induction Dental Injury: Teeth and Oropharynx as per pre-operative assessment

## 2022-05-28 NOTE — H&P (View-Only) (Signed)
  Advanced Heart Failure Rounding Note  PCP-Cardiologist: Kimball Appleby, MD   Subjective:    Admitted w/ AFL w/ RVR + acute hypoxic respiratory failure due to PNA +/- possible APT.   10/28: emergent DCCV in ED>>NSR 11/2: reverted back to AFL w/ RVR,. PO amio discontinued. Started metoprolol 12.5mg q6h. Abx started for PNA  Had iHD overnight. 2.9L volume removal.   Remains in AF/AFL w/ RVR 120s. BP normotensive.  Reports subjective improvement w/ abx, feels less SOB and cough improving, though he reports supp O2 rate was increased overnight, currently at 6L/min (up from 4L yesterday). O2 Sats 96%. WBC trending down, 16>>14K.  TSH/Free T4 nl.  BMP and HFTs pending.    Objective:   Weight Range: (!) 181.1 kg Body mass index is 54.15 kg/m.   Vital Signs:   Temp:  [98.5 F (36.9 C)-98.8 F (37.1 C)] 98.6 F (37 C) (11/03 0628) Pulse Rate:  [24-134] 118 (11/03 0628) Resp:  [16-36] 24 (11/03 0628) BP: (86-136)/(64-101) 124/68 (11/03 0628) SpO2:  [94 %-100 %] 97 % (11/03 0628) FiO2 (%):  [40 %] 40 % (11/03 0114) Weight:  [181.1 kg-186.1 kg] 181.1 kg (11/03 0628) Last BM Date : 05/27/22  Weight change: Filed Weights   05/28/22 0045 05/28/22 0550 05/28/22 0628  Weight: (!) 186.1 kg (!) 183.2 kg (!) 181.1 kg    Intake/Output:   Intake/Output Summary (Last 24 hours) at 05/28/2022 0710 Last data filed at 05/28/2022 0550 Gross per 24 hour  Intake 700.06 ml  Output 3450 ml  Net -2749.94 ml      Physical Exam    General:  obese young male, sitting up on side of bed. On 6L  but not increased WOB HEENT: Normal Neck: Supple. Thick neck, JVD not well visualized. Carotids 2+ bilat; no bruits. No lymphadenopathy or thyromegaly appreciated. Cor: PMI nondisplaced. Irregularly irregular rate & rhythm. No rubs, gallops or murmurs. Lungs: decreased BS at the bases bilaterally, no crackles  Abdomen: Obese, soft, nontender, nondistended. No hepatosplenomegaly. No bruits or  masses. Good bowel sounds. Extremities: No cyanosis, clubbing, rash, edema Neuro: Alert & orientedx3, cranial nerves grossly intact. moves all 4 extremities w/o difficulty. Affect pleasant   Telemetry   Afib w/ RVR 120s   EKG    No new EKG to review   Labs    CBC Recent Labs    05/27/22 0334 05/28/22 0251  WBC 16.1* 14.4*  HGB 8.7* 8.4*  HCT 25.8* 24.9*  MCV 102.0* 102.9*  PLT 245 245   Basic Metabolic Panel Recent Labs    05/26/22 0419 05/27/22 0334  NA 134* 134*  K 4.1 4.3  CL 95* 93*  CO2 25 22  GLUCOSE 112* 109*  BUN 48* 62*  CREATININE 9.37* 10.86*  CALCIUM 8.9 9.2  PHOS 5.2* 4.8*   Liver Function Tests Recent Labs    05/26/22 0419 05/27/22 0334  ALBUMIN 3.5 3.3*   No results for input(s): "LIPASE", "AMYLASE" in the last 72 hours. Cardiac Enzymes No results for input(s): "CKTOTAL", "CKMB", "CKMBINDEX", "TROPONINI" in the last 72 hours.  BNP: BNP (last 3 results) Recent Labs    02/18/22 0154 03/26/22 1958 05/22/22 0732  BNP 373.8* 374.2* 373.1*    ProBNP (last 3 results) No results for input(s): "PROBNP" in the last 8760 hours.   D-Dimer No results for input(s): "DDIMER" in the last 72 hours. Hemoglobin A1C No results for input(s): "HGBA1C" in the last 72 hours. Fasting Lipid Panel No results for input(s): "CHOL", "  HDL", "LDLCALC", "TRIG", "CHOLHDL", "LDLDIRECT" in the last 72 hours. Thyroid Function Tests Recent Labs    05/27/22 1448  TSH 1.750    Other results:   Imaging    No results found.   Medications:     Scheduled Medications:  apixaban  5 mg Oral BID   Chlorhexidine Gluconate Cloth  6 each Topical Daily   doxycycline  100 mg Oral Q12H   etomidate  15 mg Intravenous Once   heparin sodium (porcine)       lanthanum  2,000 mg Oral TID WC   metoprolol tartrate  12.5 mg Oral Q6H   midodrine  10 mg Oral TID WC   torsemide  100 mg Oral Once per day on Sun Mon Wed Fri    Infusions:  anticoagulant sodium  citrate     cefTRIAXone (ROCEPHIN)  IV 2 g (05/27/22 1229)   furosemide      PRN Medications: acetaminophen **OR** acetaminophen, heparin sodium (porcine), polyethylene glycol, senna-docusate    Patient Profile   36 y/o male w/ chronic biventricular heart failure due to NICM, PAF/AFL s/p multiple DCCVs, multiple intolerances to amiodarone, ESRD on iHD, OSA on CPAP and morbid obesity, admitted for acute hypoxic respiratory failure and hypotension in the setting of recurrent ALF w/ RVR, requiring emergent DCCV in ED. Now w/ concern for potential amiodarone pulmonary toxicity. Also being treated for possible PNA.     Assessment/Plan   1. Recurrent Afib/AFL w/ RVR - s/p multiple DCCVs - Amiodarone previously stopped due to elevated LFTs. Restarted 10/26  - admitted w/ hemodynamically unstable AFL w/ RVR requiring emergent DCCV>>NSR - now w/ concern for possible amiodarone pulmonary toxicity w/ elevated ESR 113 and abnormal HR chest CT w/ concerning findings. Holding amio for now - back in AFL w/ RVR overnight  - he does not tolerate AFL well w/ his HF and has had previous presentations w/ shock to due AFL. Most recent LVEF 20-25%. Mantanence of NSR is imperative. Unfortunately no other alternative rhythm control strategies. D/w EP. Not candidate for Tikosyn w/ ESRD. Too high risk for ablation due to size and other comorbitiies - now on metoprolol 12.5mg q6h; low doses due to chronic low output heart failure  - Keep NPO. Plan DCCV today  - continue Eliquis 5 mg bid       2. Chronic Systolic  Heart Failure - NICM, LHC 2021 showed no CAD  - previous hospitalizations w/ CGS in setting of rapid AFib/Flutter requiring milrinone - most recent echo 8/23 EF 20-25%, RV mildly reduced - NYHA Class III - now ESRD, volume controlled by iHD - GDMT limited by CKD and hypotension requiring midodrine  - not candidate for ICD given obesity and dialysis    3. Acute Hypoxic Respiratory Failure -  required BiPAP on admit, now on 6L [place]  - multifactorial, due to rapid AFL, PNA and ? possible amiodarone pulmonary toxicity - continue supp O2 - continue ceftriaxone + doxycyline per primary team  - continue iHD for volume management  - holding amiodarone for now - follow response to abx. If fails to improve and c/w O2 requirements, may need trial of steroids    4. ESRD - iHD per nephrology    5. Obesity - Body mass index is 55.64 kg/m.  - wt loss advised    6. IDA  - Hgb 8.7 - T-sat < 20%. Received Ferrlecit per primary team    7. OSA - continue CPAP QHS  8. PNA - continue   abx   Length of Stay: 5  Brittainy Simmons, PA-C  05/28/2022, 7:10 AM  Advanced Heart Failure Team Pager [phone] (M-F; 7a - 5p)  Please contact [facility] for night-coverage after hours (5p -7a ) and weekends on [email or web address]  Patient seen with PA, agree with the above note.  He was admitted with dyspnea, atrial flutter with RVR and initially cardioverted in the ER.  He had been restarted on amiodarone on 10/26 when he saw EP.    Atrial flutter has recurred post-initial DCCV.  Now in typical flutter with rate in 120s, has been as high as 150s.  He is on low dose metoprolol tartrate.   He denies dyspnea at rest.  Of note, after starting amiodarone, he felt like his breathing worsened.  ESR was 113 this admission with CT chest showing consolidation and nodularity, ?multifocal PNA versus amiodarone toxicity.  Amiodarone was stopped.   Patient had HD this morning.   General: NAD Neck: Thick, JVP difficult, no thyromegaly or thyroid nodule.  Lungs: Clear to auscultation bilaterally with normal respiratory effort. CV: Nondisplaced PMI.  Heart regular S1/S2, no S3/S4, no murmur.  Trace ankle edema.    Abdomen: Soft, nontender, no hepatosplenomegaly, no distention.  Skin: Intact without lesions or rashes.  Neurologic: Alert and oriented x 3.  Psych: Normal affect. Extremities: No clubbing or cyanosis.   HEENT: Normal.   Patient is back in atrial flutter with RVR.  Management is difficult, not Tikosyn candidate with ESRD and has likely had amiodarone toxicity.  Amiodarone was stopped in the past due to concern for hepatic toxicity.  Now, with worsening dyspnea and elevated ESR + infiltrates on CT chest, concern for possible amiodarone lung toxicity.  ESRD makes rate control with metoprolol difficult.  - He will need to stay off amiodarone.  - Transition to Toprol XL 25 mg bid.  - I think we need to reconsider atrial flutter ablation, understanding risk is high.  I do not think he will tolerate HR in 120s-150s at all well chronically with LV EF 20-25%.  He will be unlikely to stay in NSR long-term after DCCV without antiarrhythmic but there appears to be no option.  Will ask EP to see.  - I will repeat DCCV today to try to control him at least temporarily.  - Will ask pulmonary to see to comment on risk for amiodarone lung toxicity => should we start steroids?   Phillip Young 05/28/2022 11:11 AM  

## 2022-05-28 NOTE — Interval H&P Note (Signed)
History and Physical Interval Note:  05/28/2022 1:15 PM  Phillip Young  has presented today for surgery, with the diagnosis of afib.  The various methods of treatment have been discussed with the patient and family. After consideration of risks, benefits and other options for treatment, the patient has consented to  Procedure(s): CARDIOVERSION (N/A) as a surgical intervention.  The patient's history has been reviewed, patient examined, no change in status, stable for surgery.  I have reviewed the patient's chart and labs.  Questions were answered to the patient's satisfaction.     Kimala Horne [employer]

## 2022-05-28 NOTE — Anesthesia Postprocedure Evaluation (Signed)
Anesthesia Post Note  Patient: Phillip Young  Procedure(s) Performed: CARDIOVERSION     Patient location during evaluation: Endoscopy Anesthesia Type: General Level of consciousness: awake and alert Pain management: pain level controlled Vital Signs Assessment: post-procedure vital signs reviewed and stable Respiratory status: spontaneous breathing, nonlabored ventilation, respiratory function stable and patient connected to nasal cannula oxygen Cardiovascular status: blood pressure returned to baseline and stable Postop Assessment: no apparent nausea or vomiting Anesthetic complications: no   No notable events documented.  Last Vitals:  Vitals:   05/28/22 1442 05/28/22 1525  BP: 99/75 95/82  Pulse: 94 99  Resp: (!) 26   Temp:    SpO2: 98% 100%    Last Pain:  Vitals:   05/28/22 1442  TempSrc:   PainSc: 0-No pain                 Santa Lighter

## 2022-05-28 NOTE — Progress Notes (Signed)
HD# 6 Subjective:   Summary:  Phillip Young is a 36 yo male with history of acute on chronic respiratory failure with hypoxia, HFrEF, OSA on BIPAP, morbid obesity, ESRD on HD presented with shortness of breath, lightheadedness, and palpitations admitted with acute on chronic respiratory failure with hypoxia.     Overnight Events: Pt c/o SOB, palpitations and lightheadedness. Pt were on HFNC 6L. Pt unable to lay on his back due to worsening SOB. Switched to BIPAP with helped with SOB for a short while. Pt start to report fluttering in his chest. Denies chest pain. HR between 130-140. Hemodynamically stable. EKG done showed A flutter with RVR. Cardiology was consulted and pt started on Metoprolol Tartrate 12.5 mg Q6H and NPO for possible DCCV. BP normotensive, remains in AF/AFL w/RVR 120s.   Subjective: Pt reports SOB and cough improved with ABX. Denies fever or chills. WBC trending down 16>14K.      Objective:  Vital signs in last 24 hours: Vitals:   05/28/22 0917 05/28/22 0926 05/28/22 1151 05/28/22 1305  BP: (!) 84/68 93/72 136/83 111/84  Pulse: (!) 109  (!) 131 (!) 126  Resp:  (!) 22  (!) 25  Temp:    97.8 F (36.6 C)  TempSrc:    Temporal  SpO2:  98% 99% 96%  Weight:      Height:       Supplemental O2: Nasal Cannula 6L. SpO2: 96 % O2 Flow Rate (L/min): 6 L/min FiO2 (%): 40 %   Physical Exam:  Physical Exam Vitals and nursing note reviewed.  Constitutional:      General: He is not in acute distress.    Appearance: He is obese. He is not ill-appearing.  Cardiovascular:     Rate and Rhythm: Tachycardia present. Rhythm irregular.  Pulmonary:     Effort: Tachypnea present. No accessory muscle usage or respiratory distress.     Breath sounds: Decreased breath sounds (decrease breath sounds secondary to body habitus. No rales or rhonchi appreciated) present.  Abdominal:     Palpations: Abdomen is soft.  Skin:    General: Skin is warm.  Neurological:     Mental  Status: He is alert and oriented to person, place, and time.  Psychiatric:        Mood and Affect: Mood normal. Mood is not anxious.        Behavior: Behavior normal.      Filed Weights   05/28/22 0045 05/28/22 0550 05/28/22 0628  Weight: (!) 186.1 kg (!) 183.2 kg (!) 181.1 kg     Intake/Output Summary (Last 24 hours) at 05/28/2022 1416 Last data filed at 05/28/2022 1414 Gross per 24 hour  Intake 440.06 ml  Output 3200 ml  Net -2759.94 ml   Net IO Since Admission: -4,495.94 mL [05/28/22 1416]  Pertinent Labs:    Latest Ref Rng & Units 05/28/2022    1:36 PM 05/28/2022    2:51 AM 05/27/2022    3:34 AM  CBC  WBC 4.0 - 10.5 K/uL  14.4  16.1   Hemoglobin 13.0 - 17.0 g/dL 8.5  8.4  8.7   Hematocrit 39.0 - 52.0 % 25.0  24.9  25.8   Platelets 150 - 400 K/uL  245  245        Latest Ref Rng & Units 05/28/2022    1:36 PM 05/28/2022    6:49 AM 05/27/2022    3:34 AM  CMP  Glucose 70 - 99 mg/dL 108   109  BUN 6 - 20 mg/dL 40   62   Creatinine 0.61 - 1.24 mg/dL 8.70   10.86   Sodium 135 - 145 mmol/L 133   134   Potassium 3.5 - 5.1 mmol/L 4.1   4.3   Chloride 98 - 111 mmol/L 94   93   CO2 22 - 32 mmol/L   22   Calcium 8.9 - 10.3 mg/dL   9.2   Total Protein 6.5 - 8.1 g/dL  8.3    Total Bilirubin 0.3 - 1.2 mg/dL  1.1    Alkaline Phos 38 - 126 U/L  101    AST 15 - 41 U/L  21    ALT 0 - 44 U/L  27      Imaging: No results found.  Assessment/Plan:   Principal Problem:   Acute hypoxic respiratory failure ([name]) Active Problems:   OSA (obstructive sleep apnea)   Chronic systolic CHF (congestive heart failure) ([name])   Paroxysmal atrial flutter ([name])   ESRD (end stage renal disease) on dialysis ([place])   Hypotension   Patient Summary: Phillip Young is a 36 yo male with history of acute on chronic respiratory failure with hypoxia, HFrEF, OSA on BIPAP, morbid obesity, ESRD on HD presented with shortness of breath, lightheadedness, and palpitations admitted with acute on chronic  respiratory failure with hypoxia.     Assessment & Plan by Problem: Principal Problem:   Acute hypoxic respiratory failure ([name])   # Acute on chronic hypoxic respiratory failure  Multifactorial due to volume overload, though obesity hypoventilation and his Afib/Flutter and newly diagnosed Pneumonia are possible contributing factors. Continues to require 6L [place] to meet O2 needs and BiPAP in between day time and during sleep at night. He reports productive cough with brown sputum which have improved since the ABX treatment. Pt completed HD yesterday with 2900 ml removed. Will continue to treat volume overload as per nephrology recommendations, BiPAP at night and ABX to treat pneumonia - volume mgmt via dialysis and torsemide on non-hd days - daily bmp    # Pneumonia:  CXR reflected mild pulmonary congestion. CT chest High resolution showed : New bilateral masslike consolidations and clustered areas of nodularity, findings are favored to be due to multifocal pneumonia. ABX started yesterday. Cough and SOB gradually improving. WBC trending down 14 from 16. Continue HFNC and BiPAP  -Ceftriaxone IV - Tab Doxycycline 100 mg BID   # HFrEF with EF 20-25% 03/24/22 - nonischemic Volume overloaded still. Exam limited by body habitus. GDMT limited due to CKD and hypotension requiring midodrine. -Cardiology assisting. Midodrine on HD days  -Amiodarone D'cd as per Dr Glenetta Hew recommendations    # Atrial flutter and hypotension s/p Cardioversion Arrhythmia set off likely due to obesity hypoventilation syndrome, Hypoxemia, Afib,  anemia, chronic systolic HF with LVEF 41-28 %. Pt on and off fluctuating between Afib/A Flutter with RVR 120. He does not tolerate AFL well w/ his HF and has had previous presentations w/ shock to due AFL. Pt hemodynamically stable. Cardiology evaluated the patient. Now on metoprolol 12.5mg  q6h; low doses due to chronic low output heart failure . Plan:  - Keep NPO. DCCV today  -  continue Eliquis 5 mg bid   -Continue BIPAP   # ESRD on HD Total UF Removed 2900 this morning. Pt reports improvement in cough and SOB after HD -Volume control via HD while in the hospital.  - midodrine with HD - torsemide non-hd days   #  Iron deficiency Anemia  Iron studies done in 03/2022 reflected iron deficiency anemia ( Iron 27 and Iron saturation 8). Likely also has component related to his ESRD. Received Iron transfusion- Ferrlecit 250 mg x 2 doses - Outpatient colonoscopy  - daily CBC    #  OSA on BIPAP  Continue BIPAP as needed and every night.   # constipation - PRN miralax and Senna     Best practices Code: FULL Diet: NPO IVF: None Dispo: Anticipated discharge to Home in 2 days pending Improvement in respiratory status     Teola Bradley, MD Internal Medicine Resident PGY-1 Pager: [phone] Please contact the on call pager after 5 pm and on weekends at [phone].

## 2022-05-28 NOTE — Progress Notes (Signed)
NAME:  Phillip Young, MRN:  [id number], DOB:  08/26/1985, LOS: 5 ADMISSION DATE:  05/22/2022, CONSULTATION DATE:  05/22/22 REFERRING MD:  Tyrone Nine, CHIEF COMPLAINT:  SOB   History of Present Illness:  36 year old man well known to our service from prior admission who presents with worsening SOB.  ESRD, reduced EF, morbid obesity, OHS presenting with weight gain, orthopnea, PND, palpitations found to be in afib with hypotension.  Cardioverted and Bps have recovered okay.   He is still dyspneic.   Some concern regarding his BP so PCCM consulted to consider admission  Pertinent  Medical History  Biventricular heart failure ESRD OSA Class V obesity GERD HTN DM2  Significant Hospital Events: Including procedures, antibiotic start and stop dates in addition to other pertinent events   10/28 admit, cardioversion  Interim History / Subjective:  Reconsulted 05/28/2022 for questionable amiodarone toxicity  Objective   Blood pressure 136/83, pulse (!) 131, temperature 98.6 F (37 C), temperature source Oral, resp. rate (!) 22, height 6' (1.829 m), weight (!) 181.1 kg, SpO2 99 %.    FiO2 (%):  [40 %] 40 %   Intake/Output Summary (Last 24 hours) at 05/28/2022 1212 Last data filed at 05/28/2022 0550 Gross per 24 hour  Intake 340.06 ml  Output 3200 ml  Net -2859.94 ml   Filed Weights   05/28/22 0045 05/28/22 0550 05/28/22 0628  Weight: (!) 186.1 kg (!) 183.2 kg (!) 181.1 kg    Examination: General: Sitting up in bed without acute distress HENT: No JVD is appreciated Lungs: No use of accessory muscles diminished throughout Cardiovascular: heart sounds regular rate rhythm Abdomen: soft, hypoactive BS obese Extremities: marked anasarca vs. Adipose tissue, probably a combination Neuro: Moves all 4 ext to command Skin: No rashes  CXR wet CT confirms edema, some reactive adenopathy could consider working up at later date  Resolved Hospital Problem list   N/A  Assessment & Plan:   Acute hypoxemic respiratory failure Acute decompensated systolic heart failure (NICM) ESRD with fluid overload Symptomatic afib w/ HD compromise s/p DCCV 05/22/22 OSA on CPAP Suspected pneumonia Rule out amiodarone toxicity Chest CT 05/26/2022 groundglass opacity in infectious-like process bilateral  Reports feeling better with antimicrobial therapy 05/28/2022 CT of the chest appears more infectious than reactive Reports feeling better with current therapy MD to review CT scan results   Last Weight  Most recent update: 05/28/2022  6:42 AM    Weight  181.1 kg (399 lb 4.1 oz)             - IMTS to admit  Best Practice (right click and "Reselect all SmartList Selections" daily)    Per primary  Labs   CBC: Recent Labs  Lab 05/24/22 0315 05/25/22 0434 05/26/22 0419 05/27/22 0334 05/28/22 0251  WBC 11.9* 12.1* 14.0* 16.1* 14.4*  HGB 9.7* 9.6* 9.1* 8.7* 8.4*  HCT 27.8* 27.7* 26.4* 25.8* 24.9*  MCV 100.7* 100.4* 102.3* 102.0* 102.9*  PLT 227 230 243 245 656    Basic Metabolic Panel: Recent Labs  Lab 05/23/22 0550 05/24/22 0315 05/25/22 0434 05/26/22 0419 05/27/22 0334  NA 133* 133* 134* 134* 134*  K 3.5 4.5 4.6 4.1 4.3  CL 92* 93* 96* 95* 93*  CO2 24 22 18* 25 22  GLUCOSE 133* 106* 100* 112* 109*  BUN 42* 60* 74* 48* 62*  CREATININE 9.39* 11.57* 12.58* 9.37* 10.86*  CALCIUM 9.4 9.1 9.0 8.9 9.2  PHOS  --  6.6* 7.3* 5.2* 4.8*   GFR:  Estimated Creatinine Clearance: 15.7 mL/min (A) (by C-G formula based on SCr of 10.86 mg/dL (H)). Recent Labs  Lab 05/22/22 1747 05/23/22 0550 05/25/22 0434 05/26/22 0419 05/27/22 0334 05/28/22 0251  WBC  --    < > 12.1* 14.0* 16.1* 14.4*  LATICACIDVEN 1.8  --   --   --   --   --    < > = values in this interval not displayed.    Liver Function Tests: Recent Labs  Lab 05/24/22 0315 05/25/22 0434 05/26/22 0419 05/27/22 0334 05/28/22 0649  AST  --   --   --   --  21  ALT  --   --   --   --  27  ALKPHOS  --   --    --   --  101  BILITOT  --   --   --   --  1.1  PROT  --   --   --   --  8.3*  ALBUMIN 3.4* 3.4* 3.5 3.3* 3.4*   No results for input(s): "LIPASE", "AMYLASE" in the last 168 hours. No results for input(s): "AMMONIA" in the last 168 hours.  ABG    Component Value Date/Time   PHART 7.344 (L) 03/05/2020 1123   PCO2ART 73.1 (HH) 03/05/2020 1123   PO2ART 69.8 (L) 03/05/2020 1123   HCO3 18.3 (L) 05/22/2022 2058   TCO2 19 (L) 05/22/2022 2058   ACIDBASEDEF 5.0 (H) 05/22/2022 2058   O2SAT 86 05/22/2022 2058     Coagulation Profile: No results for input(s): "INR", "PROTIME" in the last 168 hours.  Cardiac Enzymes: No results for input(s): "CKTOTAL", "CKMB", "CKMBINDEX", "TROPONINI" in the last 168 hours.  HbA1C: Hgb A1c MFr Bld  Date/Time Value Ref Range Status  03/26/2022 07:58 PM 5.3 4.8 - 5.6 % Final    Comment:    (NOTE) Pre diabetes:          5.7%-6.4%  Diabetes:              >6.4%  Glycemic control for   <7.0% adults with diabetes   08/19/2021 05:40 AM 5.9 (H) 4.8 - 5.6 % Final    Comment:    (NOTE)         Prediabetes: 5.7 - 6.4         Diabetes: >6.4         Glycemic control for adults with diabetes: <7.0     CBG: No results for input(s): "GLUCAP" in the last 168 hours.    Critical care time: N/A    Richardson Landry Juwon Scripter ACNP Acute Care Nurse Practitioner [place] Please consult Amion 05/28/2022, 12:12 PM

## 2022-05-28 NOTE — Progress Notes (Addendum)
Patient arrived to the floor and informed nurse assigned to him that the dialysis nurse did not flush his hemodialysis catheter because the medication was not available. Patient is angry about this as he wants this done per usual.   Nurse called the dialysis nurse  and he states he was waiting on the sodium citrate to flush and lock the patient's dialysis catheter and never received it. Unsure if floor nurses do this because they are not trained to do so. The dialysis nurse states he is going home and the patient is no longer a patient of theirs because he is out of dialysis and back to the unit. Will contact IV team to see if they would flush the hemodialysis catheter once medication arrives.   Dialysis nurse received medication at 0710 and came to floor to administer it. All is well

## 2022-05-29 DIAGNOSIS — Z6841 Body Mass Index (BMI) 40.0 and over, adult: Secondary | ICD-10-CM

## 2022-05-29 DIAGNOSIS — G4733 Obstructive sleep apnea (adult) (pediatric): Secondary | ICD-10-CM | POA: Diagnosis not present

## 2022-05-29 DIAGNOSIS — J189 Pneumonia, unspecified organism: Secondary | ICD-10-CM | POA: Diagnosis not present

## 2022-05-29 DIAGNOSIS — E662 Morbid (severe) obesity with alveolar hypoventilation: Secondary | ICD-10-CM

## 2022-05-29 DIAGNOSIS — J9601 Acute respiratory failure with hypoxia: Secondary | ICD-10-CM | POA: Diagnosis not present

## 2022-05-29 LAB — RENAL FUNCTION PANEL
Albumin: 3.2 g/dL — ABNORMAL LOW (ref 3.5–5.0)
Anion gap: 24 — ABNORMAL HIGH (ref 5–15)
BUN: 54 mg/dL — ABNORMAL HIGH (ref 6–20)
CO2: 19 mmol/L — ABNORMAL LOW (ref 22–32)
Calcium: 9.4 mg/dL (ref 8.9–10.3)
Chloride: 92 mmol/L — ABNORMAL LOW (ref 98–111)
Creatinine, Ser: 9.58 mg/dL — ABNORMAL HIGH (ref 0.61–1.24)
GFR, Estimated: 7 mL/min — ABNORMAL LOW (ref >60)
Glucose, Bld: 100 mg/dL — ABNORMAL HIGH (ref 70–99)
Phosphorus: 5.4 mg/dL — ABNORMAL HIGH (ref 2.5–4.6)
Potassium: 4.8 mmol/L (ref 3.5–5.1)
Sodium: 135 mmol/L (ref 135–145)

## 2022-05-29 LAB — CBC
HCT: 26.1 % — ABNORMAL LOW (ref 39.0–52.0)
Hemoglobin: 8.8 g/dL — ABNORMAL LOW (ref 13.0–17.0)
MCH: 35.1 pg — ABNORMAL HIGH (ref 26.0–34.0)
MCHC: 33.7 g/dL (ref 30.0–36.0)
MCV: 104 fL — ABNORMAL HIGH (ref 80.0–100.0)
Platelets: 293 10*3/uL (ref 150–400)
RBC: 2.51 MIL/uL — ABNORMAL LOW (ref 4.22–5.81)
RDW: 14.6 % (ref 11.5–15.5)
WBC: 13.7 10*3/uL — ABNORMAL HIGH (ref 4.0–10.5)
nRBC: 0 % (ref 0.0–0.2)

## 2022-05-29 MED ORDER — PROSOURCE PLUS PO LIQD
30.0000 mL | Freq: Two times a day (BID) | ORAL | Status: DC
  Administered 2022-05-29 – 2022-06-10 (×12): 30 mL via ORAL
  Filled 2022-05-29 (×13): qty 30

## 2022-05-29 MED ORDER — ANTICOAGULANT SODIUM CITRATE 4% (200MG/5ML) IV SOLN
5.0000 mL | Status: DC | PRN
  Administered 2022-05-29: 5 mL
  Filled 2022-05-29 (×2): qty 5

## 2022-05-29 MED ORDER — ALTEPLASE 2 MG IJ SOLR: 2.0000 mg | Freq: Once | INTRAMUSCULAR | Status: DC | PRN

## 2022-05-29 NOTE — Progress Notes (Signed)
[facility] Progress Note   Subjective:   Seen at start of HD - 4L UFG and tolerating. BP good at the moment. S/p successful cardioversion yesterday - NSR on tele monitor today. Denies CP/dyspnea. Has BiPAP on because he plans to sleep through dialysis.  Objective Vitals:   05/28/22 1944 05/28/22 2344 05/29/22 0429 05/29/22 0751  BP:  108/73 96/73 120/78  Pulse:  (!) 103 90 89  Resp: (!) 30  20 (!) 29  Temp:  99 F (37.2 C) 98.9 F (37.2 C)   TempSrc:  Oral Axillary   SpO2:  99% 100% 100%  Weight:      Height:       Physical Exam General: Well appearing, overweight man, NAD. BiPAP on. Heart: RRR; no murmur Lungs: CTAB; no rales or wheezing Abdomen: soft Extremities: Trace - 1+ BLE edema Dialysis Access: TDC in R chest  Additional Objective Labs: Basic Metabolic Panel: Recent Labs  Lab 05/25/22 0434 05/26/22 0419 05/27/22 0334 05/28/22 1336  NA 134* 134* 134* 133*  K 4.6 4.1 4.3 4.1  CL 96* 95* 93* 94*  CO2 18* 25 22  --   GLUCOSE 100* 112* 109* 108*  BUN 74* 48* 62* 40*  CREATININE 12.58* 9.37* 10.86* 8.70*  CALCIUM 9.0 8.9 9.2  --   PHOS 7.3* 5.2* 4.8*  --    Liver Function Tests: Recent Labs  Lab 05/26/22 0419 05/27/22 0334 05/28/22 0649  AST  --   --  21  ALT  --   --  27  ALKPHOS  --   --  101  BILITOT  --   --  1.1  PROT  --   --  8.3*  ALBUMIN 3.5 3.3* 3.4*   CBC: Recent Labs  Lab 05/25/22 0434 05/26/22 0419 05/27/22 0334 05/28/22 0251 05/28/22 1336 05/29/22 0343  WBC 12.1* 14.0* 16.1* 14.4*  --  13.7*  HGB 9.6* 9.1* 8.7* 8.4* 8.5* 8.8*  HCT 27.7* 26.4* 25.8* 24.9* 25.0* 26.1*  MCV 100.4* 102.3* 102.0* 102.9*  --  104.0*  PLT 230 243 245 245  --  293   Medications:  cefTRIAXone (ROCEPHIN)  IV 2 g (05/28/22 0902)   furosemide      apixaban  5 mg Oral BID   Chlorhexidine Gluconate Cloth  6 each Topical Daily   darbepoetin (ARANESP) injection - DIALYSIS  200 mcg Subcutaneous Q Sat-1800   doxycycline  100 mg Oral Q12H    lanthanum  2,000 mg Oral TID WC   metoprolol succinate  25 mg Oral BID   midodrine  10 mg Oral TID WC   torsemide  100 mg Oral Once per day on Sun Mon Wed Fri    Dialysis Orders: [facility] TTS 4:15 425/A1.5x 180 kg 3K/2.5Ca TDC  - Heparin: None/HIT Ab POSITIVE - Mircera 120 q 4 wks (last 10/12) - Hectorol 6 TIW, Sensipar 30 TIW   Assessment/Plan: A-Flutter/RVR: S/p cardioversion 10/28 - initially did ok on amiodarone. Reverted back on 11/2 - changed from amiod to PO metoprolol and s/p another cardioversion 11/3. Respiratory distress - required BiPAP after cardioversion. O2 sats fall with ambulation. Still using BIPAP off and on. Needs ongoing UF with dialysis. Pneumonia: Per chest CT - covering with IV Ceftriaxone. WBC improving. ESRD: Continue HD per usual TTS schedule - HD now, 4L UFG today. BP/Volume: Chronic volume overload. Usually tolerates large UF goals. Midodrine 10mg  TID for BP support. BP lower today with addition of metoprolol - as above. Anemia : Hgb  8.8 - will give Aranesp today with HD. Transfuse if < 7. Metabolic bone disease -  Continue Forsrenol binder/Sensipar/Hectorol  Nutrition: Alb low, but improving. Adding protein supps. HFrEF 20-25%   Veneta Penton, PA-C 05/29/2022, 8:05 AM  [employer]

## 2022-05-29 NOTE — Progress Notes (Signed)
This is the second set up for this tx. Pt was accessed and connected about 20 min prior and blood was moving through the lines although the time had not started to indicate the initiation of tx. The machine kept alarming air in venous line but there was no visible air in line. Exited alarm and restart. Kept alarming. Had 2 techs come assess and neither saw any air but the machine wouldn't quit alarming. Decided to go ahead and change cartridge out.

## 2022-05-29 NOTE — Plan of Care (Signed)
  Problem: Education: Goal: Knowledge of General Education information will improve Description: Including pain rating scale, medication(s)/side effects and non-pharmacologic comfort measures Outcome: Progressing   Problem: Health Behavior/Discharge Planning: Goal: Ability to manage health-related needs will improve Outcome: Progressing   Problem: Clinical Measurements: Goal: Ability to maintain clinical measurements within normal limits will improve Outcome: Progressing Goal: Will remain free from infection Outcome: Progressing Goal: Diagnostic test results will improve Outcome: Progressing Goal: Respiratory complications will improve Outcome: Progressing Goal: Cardiovascular complication will be avoided Outcome: Progressing   Problem: Activity: Goal: Risk for activity intolerance will decrease Outcome: Progressing   Problem: Coping: Goal: Level of anxiety will decrease Outcome: Progressing   Problem: Elimination: Goal: Will not experience complications related to bowel motility Outcome: Progressing Goal: Will not experience complications related to urinary retention Outcome: Progressing   Problem: Safety: Goal: Ability to remain free from injury will improve Outcome: Progressing   Problem: Skin Integrity: Goal: Risk for impaired skin integrity will decrease Outcome: Progressing

## 2022-05-29 NOTE — Progress Notes (Signed)
   05/29/22 1213  Vitals  Temp 97.8 F (36.6 C)  Temp Source Oral  BP 108/66  MAP (mmHg) 79  BP Location Left Wrist  BP Method Automatic  Patient Position (if appropriate) Lying  Pulse Rate 81  Pulse Rate Source Monitor  ECG Heart Rate 88  Resp (!) 26  Oxygen Therapy  SpO2 96 %  O2 Device Nasal Cannula  O2 Flow Rate (L/min) 6 L/min  Pulse Oximetry Type Continuous   Received patient in bed to unit.  Alert and oriented.  Informed consent signed and in chart.   Treatment initiated: 0818 Treatment completed: 1200  Patient tolerated well.  Transported back to the room  Alert, without acute distress.  Hand-off given to patient's nurse.   Access used: HD cath Access issues: access issues that caused the need to reverse lines   Total UF removed: 3100 ml Medication(s) given: Na+ citrate dwells 4.22ml Post HD VS: see above Post HD weight: NA   [facility]

## 2022-05-29 NOTE — Plan of Care (Signed)
  Problem: Education: Goal: Knowledge of General Education information will improve Description Including pain rating scale, medication(s)/side effects and non-pharmacologic comfort measures Outcome: Progressing   Problem: Health Behavior/Discharge Planning: Goal: Ability to manage health-related needs will improve Outcome: Progressing   

## 2022-05-29 NOTE — Progress Notes (Signed)
RT called to place patient on bipap during dialysis treatment.  Patient on bipap and is tolerating well at this time.  Will continue to monitor.

## 2022-05-29 NOTE — Progress Notes (Signed)
Pt is off the floor for dialysis

## 2022-05-29 NOTE — Plan of Care (Signed)
  Problem: Education: Goal: Knowledge of General Education information will improve Description Including pain rating scale, medication(s)/side effects and non-pharmacologic comfort measures Outcome: Progressing   

## 2022-05-29 NOTE — Progress Notes (Signed)
Patient transported with BIPAP to hemodialysis, RN informed to call RT when patient is ready to be placed on BIPAP.

## 2022-05-29 NOTE — Progress Notes (Signed)
Patient ID: Phillip Young, male   DOB: March 03, 1986, 36 y.o.   MRN: [id number]     Advanced Heart Failure Rounding Note  PCP-Cardiologist: [name], MD   Subjective:    Admitted w/ AFL w/ RVR + acute hypoxic respiratory failure due to PNA vs amiodarone lung toxicity.   10/28: emergent DCCV in ED>>NSR 11/2: reverted back to AFL w/ RVR,. PO amio discontinued. Started metoprolol 12.85m q6h. Abx started for PNA 11/3: DCCV to NSR  Getting HD this morning.   Today, he remains in NSR.  Still reports coughing.   He continues on PNA coverage with ceftriaxone/doxycycline.   CT chest: mass-like consolidations and clustered areas of nodularity bilaterally, multifocal PNA or drug toxicity.    Objective:   Weight Range: (!) 181.1 kg Body mass index is 54.15 kg/m.   Vital Signs:   Temp:  [97.7 F (36.5 C)-99 F (37.2 C)] 98.4 F (36.9 C) (11/04 0751) Pulse Rate:  [87-131] 87 (11/04 0818) Resp:  [20-32] 26 (11/04 0818) BP: (81-136)/(62-84) 119/82 (11/04 0818) SpO2:  [92 %-100 %] 100 % (11/04 0818) FiO2 (%):  [40 %] 40 % (11/04 0818) Last BM Date : 05/27/22  Weight change: Filed Weights   05/28/22 0045 05/28/22 0550 05/28/22 0628  Weight: (!) 186.1 kg (!) 183.2 kg (!) 181.1 kg    Intake/Output:   Intake/Output Summary (Last 24 hours) at 05/29/2022 0836 Last data filed at 05/28/2022 1945 Gross per 24 hour  Intake 460 ml  Output 50 ml  Net 410 ml      Physical Exam    General: NAD, obese Neck: Thick. No JVD, no thyromegaly or thyroid nodule.  Lungs: Decreased at bases.  CV: Nondisplaced PMI.  Heart regular S1/S2, no S3/S4, no murmur.  No peripheral edema.   Abdomen: Soft, nontender, no hepatosplenomegaly, no distention.  Skin: Intact without lesions or rashes.  Neurologic: Alert and oriented x 3.  Psych: Normal affect. Extremities: No clubbing or cyanosis.  HEENT: Normal.    Telemetry   NSR 90s (personally reviewed)  EKG    No new EKG to review   Labs     CBC Recent Labs    05/28/22 0251 05/28/22 1336 05/29/22 0343  WBC 14.4*  --  13.7*  HGB 8.4* 8.5* 8.8*  HCT 24.9* 25.0* 26.1*  MCV 102.9*  --  104.0*  PLT 245  --  2244  Basic Metabolic Panel Recent Labs    05/27/22 0334 05/28/22 1336  NA 134* 133*  K 4.3 4.1  CL 93* 94*  CO2 22  --   GLUCOSE 109* 108*  BUN 62* 40*  CREATININE 10.86* 8.70*  CALCIUM 9.2  --   PHOS 4.8*  --    Liver Function Tests Recent Labs    05/27/22 0334 05/28/22 0649  AST  --  21  ALT  --  27  ALKPHOS  --  101  BILITOT  --  1.1  PROT  --  8.3*  ALBUMIN 3.3* 3.4*   No results for input(s): "LIPASE", "AMYLASE" in the last 72 hours. Cardiac Enzymes No results for input(s): "CKTOTAL", "CKMB", "CKMBINDEX", "TROPONINI" in the last 72 hours.  BNP: BNP (last 3 results) Recent Labs    02/18/22 0154 03/26/22 1958 05/22/22 0732  BNP 373.8* 374.2* 373.1*    ProBNP (last 3 results) No results for input(s): "PROBNP" in the last 8760 hours.   D-Dimer No results for input(s): "DDIMER" in the last 72 hours. Hemoglobin A1C No results for input(s): "  HGBA1C" in the last 72 hours. Fasting Lipid Panel No results for input(s): "CHOL", "HDL", "LDLCALC", "TRIG", "CHOLHDL", "LDLDIRECT" in the last 72 hours. Thyroid Function Tests Recent Labs    05/27/22 1448  TSH 1.750  T3FREE 1.8*    Other results:   Imaging    No results found.   Medications:     Scheduled Medications:  (feeding supplement) PROSource Plus  30 mL Oral BID BM   apixaban  5 mg Oral BID   Chlorhexidine Gluconate Cloth  6 each Topical Daily   darbepoetin (ARANESP) injection - DIALYSIS  200 mcg Subcutaneous Q Sat-1800   doxycycline  100 mg Oral Q12H   lanthanum  2,000 mg Oral TID WC   metoprolol succinate  25 mg Oral BID   midodrine  10 mg Oral TID WC   torsemide  100 mg Oral Once per day on Sun Mon Wed Fri    Infusions:  anticoagulant sodium citrate     cefTRIAXone (ROCEPHIN)  IV 2 g (05/28/22 0902)    furosemide      PRN Medications: acetaminophen **OR** acetaminophen, alteplase, anticoagulant sodium citrate, polyethylene glycol, senna-docusate    Patient Profile   36 y/o male w/ chronic biventricular heart failure due to NICM, PAF/AFL s/p multiple DCCVs, multiple intolerances to amiodarone, ESRD on iHD, OSA on CPAP and morbid obesity, admitted for acute hypoxic respiratory failure and hypotension in the setting of recurrent ALF w/ RVR, requiring emergent DCCV in ED. Now w/ concern for potential amiodarone pulmonary toxicity. Also being treated for possible PNA.     Assessment/Plan   1. Atrial flutter: History of multiple DCCVs. Amiodarone previously stopped due to elevated LFTs. Restarted 05/20/22 by Dr. Curt Bears.  Admitted w/ hemodynamically unstable AFL w/ RVR requiring emergent DCCV>>NSR in ER.  Atrial flutter recurred with RVR post-DCCV.  Now w/ concern for possible amiodarone pulmonary toxicity w/ elevated ESR 113 and abnormal high resolution chest CT showing mass-like consolidations and clustered areas of nodularity bilaterally, multifocal PNA or drug toxicity. Amiodarone stopped (he says breathing got worse after restarting on amiodarone).  We did repeat DCCV on 11/3, he remains in NSR this morning.  - Continue Toprol XL 25 mg bid.  - He does not tolerate atrial flutter well w/ his HF and has had previous presentations w/ shock to due atrial flutter. Most recent LVEF 20-25%. Mantanence of NSR is imperative. Unfortunately, no other alternative pharmacological rhythm control strategies. Asked EP to reconsider atrial flutter ablation, understanding it is high risk but he is young and options are very limited.   - Continue Eliquis 5 mg bid  2.  Chronic systolic CHF: Nonischemic CMP, LHC 2021 with no CAD. Previous hospitalizations w/shock in setting of rapid AFib/Flutter requiring milrinone. Most recent echo 8/23 with EF 20-25%, RV mildly reduced.  - ESRD, volume controlled by HD (getting  today).  - GDMT limited by CKD and hypotension requiring midodrine  - Not candidate for ICD given obesity and dialysis  - Not candidate for advanced therapies.  3. Acute Hypoxic Respiratory Failure: Combination of volume overload and multifocal PNA.  High resolution CT chest showed mass-like consolidations and clustered areas of nodularity bilaterally, multifocal PNA or drug toxicity. Unclear if lung opacifications are infectious or inflammatory.  - Continue ceftriaxone + doxycyline per primary team  - Amiodarone has been stopped.  - Follow response to abx. If fails to improve and c/w O2 requirements, may need trial of steroids. Pulmonary following.  4. ESRD: HD per nephrology  5.  Obesity: Body mass index is 55.64 kg/m.  - wt loss advised  6. Fe deficiency anemia.  - T-sat < 20%. Received Ferrlecit per primary team  7. OSA: Continue CPAP QHS  Length of Stay: [place], [place]  05/29/2022, 8:36 AM  Advanced Heart Failure Team Pager [phone] (M-F; 7a - 5p)  Please contact [facility] for night-coverage after hours (5p -7a ) and weekends on [email or web address]

## 2022-05-29 NOTE — Progress Notes (Signed)
HD#6 SUBJECTIVE:  Patient Summary: Phillip Young is a 36 y.o. with a pertinent PMH of HFrEF, OSA/OHS, morbidy obesity, ESRD on HD, Afib/flutter, who presented with SOB, lightheadedness, and palpitations and admitted for acute on chronic hypoxic respiratory failure.   Overnight Events: No acute events overnight  Interim History: The patient was seen in HD today and notes that he feels better since his cardioversion yesterday. His breathing feels improved, but does still endorse a cough.   OBJECTIVE:  Vital Signs: Vitals:   05/28/22 1900 05/28/22 1927 05/28/22 2344 05/29/22 0429  BP:  118/79 108/73 96/73  Pulse: 95 (!) 101 (!) 103 90  Resp:  20  20  Temp:  97.7 F (36.5 C) 99 F (37.2 C) 98.9 F (37.2 C)  TempSrc:  Axillary Oral Axillary  SpO2: 100% 100% 99% 100%  Weight:      Height:       Supplemental O2:  BiPAP SpO2: 100 % O2 Flow Rate (L/min): 6 L/min FiO2 (%): 40 %  Filed Weights   05/28/22 0045 05/28/22 0550 05/28/22 0628  Weight: (!) 186.1 kg (!) 183.2 kg (!) 181.1 kg     Intake/Output Summary (Last 24 hours) at 05/29/2022 0628 Last data filed at 05/28/2022 1945 Gross per 24 hour  Intake 460 ml  Output 50 ml  Net 410 ml   Net IO Since Admission: -4,185.94 mL [05/29/22 0628]  Physical Exam: General: Obese male laying in bed. No acute distress. Head: Normocephalic. Atraumatic. CV: RRR. No murmurs. Trace LE edema Pulmonary: Lungs CTAB. Normal effort (on BiPAP). No wheezing or rales appreciated. Abdominal: Soft, nontender.  Extremities: Normal ROM. Skin: Warm and dry. Neuro: A&Ox3. No focal deficit. Psych: Normal mood and affect    ASSESSMENT/PLAN:  Assessment: Principal Problem:   Acute hypoxic respiratory failure ([name]) Active Problems:   OSA (obstructive sleep apnea)   Chronic systolic CHF (congestive heart failure) ([name])   Paroxysmal atrial flutter ([name])   ESRD (end stage renal disease) on dialysis ([name])   Hypotension   Plan: #Acute on  chronic hypoxic respiratory failure #Community acquired pneumonia #OSA on BiPAP qhs #OHS Secondary to volume overload, OSA/OHS, Afib/flutter, and CAP. Amiodarone toxicity could be contributing, but is less likely. Patient continues to require 6 L [place] and BiPAP intermittently (and every evening). He still endorses a cough productive of brown sputum but notes that his breathing is improved. - Volume management with HD - Continue ceftriaxone and doxycycline (day 3) - Daily CBC - Continue to wean O2 requirements as able - BiPAP qhs  - PT/OT as able  #ESRD on HD TThS Patient was seen in HD today, as per his usual schedule. - HD TThS per nephro - Torsemide 100 mg on non-HD days - Daily renal function panel  #HFrEF 2/2 nonischemic cardiomyopathy (EF 20-25%) #Hypotension GDMT has been limited due to ESRD and hypotension requiring midodrine. Patient is also not a candidate for ICD given dialysis and obesity.  - Midodrine 10 mg tid with meals - Continue to hold amiodarone - Torsemide 100 mg on Sun, Mon, Weds, Fri (non-HD days)  #Atrial flutter s/p cardioversion x2 Patient was cardioverted in the ED and had been in NSR until the evening of 11/2, when he was noted to go back into atrial flutter with RVR. Patient was again cardioverted on 11/3 and has been doing well, and remains in NSR. EP has been consulted to determine if patient is a candidate for ablation (plan to discuss further on Monday). - Eliquis 5  mg bid  Iron deficiency anemia S/p Ferrlecit 250 mg x2 this admission. Hb stable at 8.8 - Daily CBC  Best Practice: Diet: Renal diet IVF: Fluids: none VTE: Eliquis Code: Full AB: Rocephin, doxycycline Therapy Recs: Pending DISPO: Anticipated discharge to Home pending Medical stability and Oxygen Requirement .  Signature: Buddy Duty, D.O.  Internal Medicine Resident, PGY-2 [facility]  Pager: [phone] 6:28 AM, 05/29/2022   Please contact the on  call pager after 5 pm and on weekends at [phone].

## 2022-05-30 ENCOUNTER — Encounter (HOSPITAL_COMMUNITY): Payer: Self-pay | Admitting: Cardiology

## 2022-05-30 DIAGNOSIS — J9601 Acute respiratory failure with hypoxia: Secondary | ICD-10-CM | POA: Diagnosis not present

## 2022-05-30 DIAGNOSIS — I483 Typical atrial flutter: Secondary | ICD-10-CM | POA: Diagnosis not present

## 2022-05-30 LAB — CBC
HCT: 24.1 % — ABNORMAL LOW (ref 39.0–52.0)
Hemoglobin: 8.7 g/dL — ABNORMAL LOW (ref 13.0–17.0)
MCH: 37.7 pg — ABNORMAL HIGH (ref 26.0–34.0)
MCHC: 36.1 g/dL — ABNORMAL HIGH (ref 30.0–36.0)
MCV: 104.3 fL — ABNORMAL HIGH (ref 80.0–100.0)
Platelets: 285 10*3/uL (ref 150–400)
RBC: 2.31 MIL/uL — ABNORMAL LOW (ref 4.22–5.81)
RDW: 14.6 % (ref 11.5–15.5)
WBC: 13.5 10*3/uL — ABNORMAL HIGH (ref 4.0–10.5)
nRBC: 0 % (ref 0.0–0.2)

## 2022-05-30 LAB — RENAL FUNCTION PANEL
Albumin: 3 g/dL — ABNORMAL LOW (ref 3.5–5.0)
Anion gap: 17 — ABNORMAL HIGH (ref 5–15)
BUN: 53 mg/dL — ABNORMAL HIGH (ref 6–20)
CO2: 21 mmol/L — ABNORMAL LOW (ref 22–32)
Calcium: 9.1 mg/dL (ref 8.9–10.3)
Chloride: 95 mmol/L — ABNORMAL LOW (ref 98–111)
Creatinine, Ser: 8.68 mg/dL — ABNORMAL HIGH (ref 0.61–1.24)
GFR, Estimated: 7 mL/min — ABNORMAL LOW (ref >60)
Glucose, Bld: 113 mg/dL — ABNORMAL HIGH (ref 70–99)
Phosphorus: 4.6 mg/dL (ref 2.5–4.6)
Potassium: 4.1 mmol/L (ref 3.5–5.1)
Sodium: 133 mmol/L — ABNORMAL LOW (ref 135–145)

## 2022-05-30 NOTE — Progress Notes (Signed)
[facility] Progress Note   Subjective:  Seen in room - cough better today, feeling better overall. Tele shows sustained NSR. Dialysis went ok yesterday - net UF 3.1L, tried to get more, but HD circuit kept clotting (can't get heparin d/t HIT) and required many, many fluid boluses.  Objective Vitals:   05/29/22 2300 05/30/22 0005 05/30/22 0535 05/30/22 0800  BP:  95/67 130/67 (!) 99/56  Pulse: 90 94 (!) 115 95  Resp: (!) 30 20    Temp:  98.1 F (36.7 C) 99.5 F (37.5 C) 98.6 F (37 C)  TempSrc:  Axillary Oral Oral  SpO2: 100% 100% 97% 97%  Weight:      Height:       Physical Exam General: Well appearing, overweight man, NAD.  Heart: RRR; no murmur Lungs: CTAB; no rales or wheezing Abdomen: soft Extremities: No BLE edema Dialysis Access: TDC in R chest    Additional Objective Labs: Basic Metabolic Panel: Recent Labs  Lab 05/27/22 0334 05/28/22 1336 05/29/22 0343 05/30/22 0352  NA 134* 133* 135 133*  K 4.3 4.1 4.8 4.1  CL 93* 94* 92* 95*  CO2 22  --  19* 21*  GLUCOSE 109* 108* 100* 113*  BUN 62* 40* 54* 53*  CREATININE 10.86* 8.70* 9.58* 8.68*  CALCIUM 9.2  --  9.4 9.1  PHOS 4.8*  --  5.4* 4.6   Liver Function Tests: Recent Labs  Lab 05/28/22 0649 05/29/22 0343 05/30/22 0352  AST 21  --   --   ALT 27  --   --   ALKPHOS 101  --   --   BILITOT 1.1  --   --   PROT 8.3*  --   --   ALBUMIN 3.4* 3.2* 3.0*   CBC: Recent Labs  Lab 05/26/22 0419 05/27/22 0334 05/28/22 0251 05/28/22 1336 05/29/22 0343 05/30/22 0352  WBC 14.0* 16.1* 14.4*  --  13.7* 13.5*  HGB 9.1* 8.7* 8.4* 8.5* 8.8* 8.7*  HCT 26.4* 25.8* 24.9* 25.0* 26.1* 24.1*  MCV 102.3* 102.0* 102.9*  --  104.0* 104.3*  PLT 243 245 245  --  293 285   Medications:  cefTRIAXone (ROCEPHIN)  IV 2 g (05/29/22 1348)   furosemide      (feeding supplement) PROSource Plus  30 mL Oral BID BM   apixaban  5 mg Oral BID   Chlorhexidine Gluconate Cloth  6 each Topical Daily   darbepoetin  (ARANESP) injection - DIALYSIS  200 mcg Subcutaneous Q Sat-1800   doxycycline  100 mg Oral Q12H   lanthanum  2,000 mg Oral TID WC   metoprolol succinate  25 mg Oral BID   midodrine  10 mg Oral TID WC   torsemide  100 mg Oral Once per day on Sun Mon Wed Fri    Dialysis Orders: [facility] TTS 4:15 425/A1.5x 180 kg 3K/2.5Ca TDC  - Heparin: None/HIT Ab POSITIVE - Mircera 120 q 4 wks (last 10/12) - Hectorol 6 TIW, Sensipar 30 TIW   Assessment/Plan: A-Flutter/RVR: S/p cardioversion 10/28 - initially did ok on amiodarone. Reverted back on 11/2 - changed from amiod to PO metoprolol and s/p another cardioversion 11/3. Respiratory distress - required BiPAP after cardioversion. O2 sats fall with ambulation. Still using BIPAP off and on. Needs ongoing UF with dialysis. Pneumonia: Per chest CT - covering with IV Ceftriaxone. WBC improving. ESRD: Continue HD per usual TTS schedule - next HD 11/7. BP/Volume: Chronic volume overload. Usually tolerates large UF goals. Midodrine 10mg  TID  for BP support. BP lower today with addition of metoprolol - unclear if will need to be on this long term. Getting closer to outpatient dry weight, we talked about how overload puts him at risk for recurrent A-fib - he planned to really work on fluid restrictions. Anemia : Hgb 8.7 - Aranesp given 11/4. Transfuse if < 7. Metabolic bone disease -  Continue Forsrenol binder/Sensipar/Hectorol  Nutrition: Alb low, but improving - continue supps. HFrEF 20-25%   Veneta Penton, PA-C 05/30/2022, 10:00 AM  [employer]

## 2022-05-30 NOTE — Progress Notes (Signed)
Patient ID: Dencil Cayson, male   DOB: June 08, 1986, 36 y.o.   MRN: [id number]    Advanced Heart Failure Rounding Note  PCP-Cardiologist: [name], MD   Subjective:    Admitted w/ AFL w/ RVR + acute hypoxic respiratory failure due to PNA vs amiodarone lung toxicity.   10/28: emergent DCCV in ED>>NSR 11/2: reverted back to AFL w/ RVR,. PO amio discontinued. Started metoprolol 12.80m q6h. Abx started for PNA 11/3: DCCV to NSR  Today, he remains in NSR.  Cough better.  On nasal cannula.   He continues on PNA coverage with ceftriaxone/doxycycline.   CT chest: mass-like consolidations and clustered areas of nodularity bilaterally, multifocal PNA or drug toxicity.    Objective:   Weight Range: (!) 182.9 kg Body mass index is 54.69 kg/m.   Vital Signs:   Temp:  [97.8 F (36.6 C)-99.5 F (37.5 C)] 99.5 F (37.5 C) (11/05 0535) Pulse Rate:  [81-115] 115 (11/05 0535) Resp:  [20-39] 20 (11/05 0005) BP: (52-130)/(40-89) 130/67 (11/05 0535) SpO2:  [96 %-100 %] 97 % (11/05 0535) FiO2 (%):  [40 %] 40 % (11/04 1200) Last BM Date : 05/27/22  Weight change: Filed Weights   05/28/22 0550 05/28/22 0628 05/29/22 0751  Weight: (!) 183.2 kg (!) 181.1 kg (!) 182.9 kg    Intake/Output:   Intake/Output Summary (Last 24 hours) at 05/30/2022 0801 Last data filed at 05/30/2022 0752 Gross per 24 hour  Intake 120 ml  Output 3650 ml  Net -3530 ml      Physical Exam    General: NAD, obese Neck: No JVD, no thyromegaly or thyroid nodule.  Lungs: Clear to auscultation bilaterally with normal respiratory effort. CV: Nondisplaced PMI.  Heart regular S1/S2, no S3/S4, no murmur.  Trace ankle edema.  Abdomen: Soft, nontender, no hepatosplenomegaly, no distention.  Skin: Intact without lesions or rashes.  Neurologic: Alert and oriented x 3.  Psych: Normal affect. Extremities: No clubbing or cyanosis.  HEENT: Normal.    Telemetry   NSR 90s-100s (personally reviewed)  EKG    No new  EKG to review   Labs    CBC Recent Labs    05/29/22 0343 05/30/22 0352  WBC 13.7* 13.5*  HGB 8.8* 8.7*  HCT 26.1* 24.1*  MCV 104.0* 104.3*  PLT 293 2093  Basic Metabolic Panel Recent Labs    05/29/22 0343 05/30/22 0352  NA 135 133*  K 4.8 4.1  CL 92* 95*  CO2 19* 21*  GLUCOSE 100* 113*  BUN 54* 53*  CREATININE 9.58* 8.68*  CALCIUM 9.4 9.1  PHOS 5.4* 4.6   Liver Function Tests Recent Labs    05/28/22 0649 05/29/22 0343 05/30/22 0352  AST 21  --   --   ALT 27  --   --   ALKPHOS 101  --   --   BILITOT 1.1  --   --   PROT 8.3*  --   --   ALBUMIN 3.4* 3.2* 3.0*   No results for input(s): "LIPASE", "AMYLASE" in the last 72 hours. Cardiac Enzymes No results for input(s): "CKTOTAL", "CKMB", "CKMBINDEX", "TROPONINI" in the last 72 hours.  BNP: BNP (last 3 results) Recent Labs    02/18/22 0154 03/26/22 1958 05/22/22 0732  BNP 373.8* 374.2* 373.1*    ProBNP (last 3 results) No results for input(s): "PROBNP" in the last 8760 hours.   D-Dimer No results for input(s): "DDIMER" in the last 72 hours. Hemoglobin A1C No results for input(s): "HGBA1C" in  the last 72 hours. Fasting Lipid Panel No results for input(s): "CHOL", "HDL", "LDLCALC", "TRIG", "CHOLHDL", "LDLDIRECT" in the last 72 hours. Thyroid Function Tests Recent Labs    05/27/22 1448  TSH 1.750  T3FREE 1.8*    Other results:   Imaging    No results found.   Medications:     Scheduled Medications:  (feeding supplement) PROSource Plus  30 mL Oral BID BM   apixaban  5 mg Oral BID   Chlorhexidine Gluconate Cloth  6 each Topical Daily   darbepoetin (ARANESP) injection - DIALYSIS  200 mcg Subcutaneous Q Sat-1800   doxycycline  100 mg Oral Q12H   lanthanum  2,000 mg Oral TID WC   metoprolol succinate  25 mg Oral BID   midodrine  10 mg Oral TID WC   torsemide  100 mg Oral Once per day on Sun Mon Wed Fri    Infusions:  cefTRIAXone (ROCEPHIN)  IV 2 g (05/29/22 1348)   furosemide       PRN Medications: acetaminophen **OR** acetaminophen, polyethylene glycol, senna-docusate    Patient Profile   36 y/o male w/ chronic biventricular heart failure due to NICM, PAF/AFL s/p multiple DCCVs, multiple intolerances to amiodarone, ESRD on iHD, OSA on CPAP and morbid obesity, admitted for acute hypoxic respiratory failure and hypotension in the setting of recurrent ALF w/ RVR, requiring emergent DCCV in ED. Now w/ concern for potential amiodarone pulmonary toxicity. Also being treated for possible PNA.     Assessment/Plan   1. Atrial flutter: History of multiple DCCVs. Amiodarone previously stopped due to elevated LFTs. Restarted 05/20/22 by Dr. Curt Bears.  Admitted w/ hemodynamically unstable AFL w/ RVR requiring emergent DCCV>>NSR in ER.  Atrial flutter recurred with RVR post-DCCV.  Now w/ concern for possible amiodarone pulmonary toxicity w/ elevated ESR 113 and abnormal high resolution chest CT showing mass-like consolidations and clustered areas of nodularity bilaterally, multifocal PNA or drug toxicity. Amiodarone stopped (he says breathing got worse after restarting on amiodarone).  We did repeat DCCV on 11/3, he remains in NSR this morning.  - Continue Toprol XL 25 mg bid.  - He does not tolerate atrial flutter well w/ his HF and has had previous presentations w/ shock to due atrial flutter. Most recent LVEF 20-25%. Mantanence of NSR is imperative. Unfortunately, no other alternative pharmacological rhythm control strategies. Asked EP to reconsider atrial flutter ablation, understanding it is high risk but he is young and options are very limited.  EP group to discuss.  - Continue Eliquis 5 mg bid  2.  Chronic systolic CHF: Nonischemic CMP, LHC 2021 with no CAD. Previous hospitalizations w/shock in setting of rapid AFib/Flutter requiring milrinone. Most recent echo 8/23 with EF 20-25%, RV mildly reduced.  - ESRD, volume controlled by HD (had yesterday) - GDMT limited by CKD and  hypotension requiring midodrine  - Not candidate for ICD given obesity and dialysis  - Not candidate for advanced therapies.  3. Acute Hypoxic Respiratory Failure: Combination of volume overload and multifocal PNA.  High resolution CT chest showed mass-like consolidations and clustered areas of nodularity bilaterally, multifocal PNA or drug toxicity. Unclear if lung opacifications are infectious or inflammatory.  - Continue ceftriaxone + doxycyline per primary team  - Amiodarone has been stopped.  - Follow response to abx. If fails to improve and c/w O2 requirements, may need trial of steroids. Pulmonary has seen.  4. ESRD: HD per nephrology  5. Obesity: Body mass index is 55.64 kg/m.  -  wt loss advised  6. Fe deficiency anemia.  - T-sat < 20%. Received Ferrlecit per primary team  7. OSA: Continue CPAP QHS  Length of Stay: 7  [name], MD  05/30/2022, 8:01 AM  Advanced Heart Failure Team Pager [phone] (M-F; 7a - 5p)  Please contact [facility] for night-coverage after hours (5p -7a ) and weekends on [email or web address]

## 2022-05-30 NOTE — Plan of Care (Signed)
  Problem: Education: Goal: Knowledge of General Education information will improve Description Including pain rating scale, medication(s)/side effects and non-pharmacologic comfort measures Outcome: Progressing   

## 2022-05-30 NOTE — Progress Notes (Signed)
HD#8  Subjective:   Patient Summary: Phillip Young is a 36 y.o. with a pertinent PMH of HFrEF, OSA/OHS, morbidy obesity, ESRD on HD, Afib/flutter, who presented with SOB, lightheadedness, and palpitations and admitted for acute on chronic hypoxic respiratory failure.    Overnight Events: No acute events overnight   Interim History: [name]  found comfortably laying in bed with BIPAP. Reports breathing and cough have significantly improved. Pt remains in sinus rhythm, normotensive and O2 saturation 100% on BIPAP. Pt underwent HD yesterday with total UF removal of 3.1L.  His breathing feels improved, but does still endorse a small cough.  Objective:  Vital signs in last 24 hours: Vitals:   05/30/22 0005 05/30/22 0535 05/30/22 0800 05/30/22 1146  BP: 95/67 130/67 (!) 99/56 103/67  Pulse: 94 (!) 115 95 93  Resp: 20   17  Temp: 98.1 F (36.7 C) 99.5 F (37.5 C) 98.6 F (37 C) 98.6 F (37 C)  TempSrc: Axillary Oral Oral Oral  SpO2: 100% 97% 97% 99%  Weight:      Height:       Supplemental O2: Nasal Cannula SpO2: 99 % O2 Flow Rate (L/min): 5 L/min FiO2 (%): 40 %   Physical Exam:  Constitutional: well-appearing well laying in bed on supine, in no acute distress HENT: normocephalic atraumatic, mucous membranes moist Eyes: conjunctiva non-erythematous Neck: supple Cardiovascular: regular rate and rhythm, no m/r/g Pulmonary/Chest: normal work of breathing on room air, lungs clear to auscultation bilaterally Abdominal: soft, non-tender, non-distended MSK: normal bulk and tone Neurological: alert & oriented x 3. Skin: warm and dry Psych: Normal moon and affect.  Filed Weights   05/28/22 0550 05/28/22 0628 05/29/22 0751  Weight: (!) 183.2 kg (!) 181.1 kg (!) 182.9 kg     Intake/Output Summary (Last 24 hours) at 05/30/2022 1214 Last data filed at 05/30/2022 0752 Gross per 24 hour  Intake 120 ml  Output 550 ml  Net -430 ml   Net IO Since Admission: -7,715.94 mL [05/30/22  1214]  Pertinent Labs:    Latest Ref Rng & Units 05/30/2022    3:52 AM 05/29/2022    3:43 AM 05/28/2022    1:36 PM  CBC  WBC 4.0 - 10.5 K/uL 13.5  13.7    Hemoglobin 13.0 - 17.0 g/dL 8.7  8.8  8.5   Hematocrit 39.0 - 52.0 % 24.1  26.1  25.0   Platelets 150 - 400 K/uL 285  293         Latest Ref Rng & Units 05/30/2022    3:52 AM 05/29/2022    3:43 AM 05/28/2022    1:36 PM  CMP  Glucose 70 - 99 mg/dL 113  100  108   BUN 6 - 20 mg/dL 53  54  40   Creatinine 0.61 - 1.24 mg/dL 8.68  9.58  8.70   Sodium 135 - 145 mmol/L 133  135  133   Potassium 3.5 - 5.1 mmol/L 4.1  4.8  4.1   Chloride 98 - 111 mmol/L 95  92  94   CO2 22 - 32 mmol/L 21  19    Calcium 8.9 - 10.3 mg/dL 9.1  9.4      Imaging: No results found.  Assessment/Plan:   Principal Problem:   Acute hypoxic respiratory failure ([name]) Active Problems:   OSA (obstructive sleep apnea)   Chronic systolic CHF (congestive heart failure) ([name])   Paroxysmal atrial flutter ([name])   ESRD (end stage renal disease) on  dialysis ([place])   Hypotension  Plan: #Acute on chronic hypoxic respiratory failure #Community acquired pneumonia #OSA on BiPAP qhs #OHS Multifactorial including volume overload from ESRD, OSA/OHS, Afib/flutter, anemia and CAP. Amiodarone toxicity could be contributing, but is less likely. Patient continues to require 6 L [place] and BiPAP intermittently (and every evening). He still endorses mild cough. Breathing significantly improved since on antibiotic and after the HD. - Volume management with HD - Continue ceftriaxone and doxycycline (day 4) - Daily CBC - Continue to wean O2 requirements as able - BiPAP qhs  - PT/OT as able   #ESRD on HD TThS Patient was seen in HD yesterday, as per his usual schedule: 3100 total removed  - HD TThS per nephro - Torsemide 100 mg on non-HD days - Daily renal function panel   #HFrEF 2/2 nonischemic cardiomyopathy (EF 20-25%) #Hypotension GDMT has been limited due to ESRD and  hypotension requiring midodrine. Patient is also not a candidate for ICD given dialysis and obesity.  - Midodrine 10 mg tid with meals - Continue to hold amiodarone - Torsemide 100 mg on Sun, Mon, Weds, Fri (non-HD days)   #Atrial flutter s/p cardioversion x2 Patient was cardioverted in the ED and had been in NSR until the evening of 11/2, when he was noted to go back into atrial flutter with RVR. Patient was again cardioverted on 11/3 and has been doing well. He remains in NSR with HR 90-100. Cardiologist saw the patient today asked EP to reconsider if patient is a candidate for ablation. EP to discuss the plan on Monday. - Eliquis 5 mg bid    Iron deficiency anemia S/p Ferrlecit 250 mg x2 this admission. Hb stable at 8.8 - Daily CBC   Best Practice: Diet: Renal diet IVF: Fluids: none VTE: Eliquis Code: Full AB: Rocephin, doxycycline Therapy Recs: Pending DISPO: Anticipated discharge to Home pending Medical stability and Oxygen Requirement .    Teola Bradley, MD Internal Medicine Resident PGY-1 Pager: [phone] Please contact the on call pager after 5 pm and on weekends at [phone].

## 2022-05-30 NOTE — Progress Notes (Signed)
Pt resting comfortably on Bipap. Will continue to monitor.

## 2022-05-31 ENCOUNTER — Telehealth: Payer: Self-pay | Admitting: Critical Care Medicine

## 2022-05-31 ENCOUNTER — Inpatient Hospital Stay (HOSPITAL_COMMUNITY): Payer: BC Managed Care – PPO

## 2022-05-31 DIAGNOSIS — J9601 Acute respiratory failure with hypoxia: Secondary | ICD-10-CM | POA: Diagnosis not present

## 2022-05-31 DIAGNOSIS — R053 Chronic cough: Secondary | ICD-10-CM | POA: Diagnosis not present

## 2022-05-31 DIAGNOSIS — R918 Other nonspecific abnormal finding of lung field: Secondary | ICD-10-CM

## 2022-05-31 DIAGNOSIS — E662 Morbid (severe) obesity with alveolar hypoventilation: Secondary | ICD-10-CM | POA: Diagnosis not present

## 2022-05-31 DIAGNOSIS — I483 Typical atrial flutter: Secondary | ICD-10-CM | POA: Diagnosis not present

## 2022-05-31 DIAGNOSIS — Z20822 Contact with and (suspected) exposure to covid-19: Secondary | ICD-10-CM | POA: Diagnosis not present

## 2022-05-31 DIAGNOSIS — J189 Pneumonia, unspecified organism: Secondary | ICD-10-CM | POA: Diagnosis not present

## 2022-05-31 DIAGNOSIS — G4733 Obstructive sleep apnea (adult) (pediatric): Secondary | ICD-10-CM | POA: Diagnosis not present

## 2022-05-31 DIAGNOSIS — I5023 Acute on chronic systolic (congestive) heart failure: Secondary | ICD-10-CM | POA: Diagnosis not present

## 2022-05-31 LAB — C-REACTIVE PROTEIN: CRP: 11.5 mg/dL — ABNORMAL HIGH (ref <1.0)

## 2022-05-31 LAB — CBC
HCT: 23.6 % — ABNORMAL LOW (ref 39.0–52.0)
Hemoglobin: 8.8 g/dL — ABNORMAL LOW (ref 13.0–17.0)
MCH: 38.6 pg — ABNORMAL HIGH (ref 26.0–34.0)
MCHC: 37.3 g/dL — ABNORMAL HIGH (ref 30.0–36.0)
MCV: 103.5 fL — ABNORMAL HIGH (ref 80.0–100.0)
Platelets: 266 10*3/uL (ref 150–400)
RBC: 2.28 MIL/uL — ABNORMAL LOW (ref 4.22–5.81)
RDW: 14.3 % (ref 11.5–15.5)
WBC: 12.2 10*3/uL — ABNORMAL HIGH (ref 4.0–10.5)
nRBC: 0 % (ref 0.0–0.2)

## 2022-05-31 LAB — SEDIMENTATION RATE: Sed Rate: 135 mm/hr — ABNORMAL HIGH (ref 0–16)

## 2022-05-31 MED ORDER — FLUTICASONE FUROATE-VILANTEROL 100-25 MCG/ACT IN AEPB
1.0000 | INHALATION_SPRAY | Freq: Every day | RESPIRATORY_TRACT | Status: DC
  Administered 2022-06-02 – 2022-06-05 (×4): 1 via RESPIRATORY_TRACT
  Filled 2022-05-31: qty 28

## 2022-05-31 MED ORDER — CHLORHEXIDINE GLUCONATE CLOTH 2 % EX PADS: 6.0000 | MEDICATED_PAD | Freq: Every day | CUTANEOUS | Status: DC

## 2022-05-31 NOTE — Telephone Encounter (Signed)
Follow up in Pulmonary clinic requested in 1 month.  Julian Hy, DO 05/31/22 4:21 PM [facility]

## 2022-05-31 NOTE — Progress Notes (Signed)
Patient back into atrial fibrillation, HR 140. Oxygen saturations were 98%. However patient appeared to have slight increase WOB. Patient refused  being placed on bipap. Patient attributes Afib to being woken up for lab draw. Says he was startled. Patient talked about ablation. Open to now discussing ablation with medical team as he says his mother had reservations previously to him having ablation. Dr Nooruddin notified of Afib. Instructed to monitor patient for now.

## 2022-05-31 NOTE — Progress Notes (Signed)
[facility] Progress Note   Subjective:   reports his heart is "out of rhythm" this AM. Dyspnea with exertion and prolonged speaking. EP group to discuss his case. He denies CP, dizziness, abdominal pain, nausea and vomiting. Had some UOP this AM.   Objective Vitals:   05/30/22 1146 05/30/22 1700 05/30/22 2321 05/31/22 0437  BP: 103/67 105/65  91/71  Pulse: 93 91 86 (!) 133  Resp: 17 16 20    Temp: 98.6 F (37 C)   97.8 F (36.6 C)  TempSrc: Oral   Oral  SpO2: 99% 98% 98% 96%  Weight:      Height:       Physical Exam General: Obese male, alert and in NAD Heart: Tachycardic, irregular rhythm. No murmurs.  Lungs: Diminished breath sounds b/l, no rhonchi or rales auscultated Abdomen: Soft, non-distended, +BS Extremities: No edema b/l lower extremities Dialysis Access: R IJ [facility]  Additional Objective Labs: Basic Metabolic Panel: Recent Labs  Lab 05/27/22 0334 05/28/22 1336 05/29/22 0343 05/30/22 0352  NA 134* 133* 135 133*  K 4.3 4.1 4.8 4.1  CL 93* 94* 92* 95*  CO2 22  --  19* 21*  GLUCOSE 109* 108* 100* 113*  BUN 62* 40* 54* 53*  CREATININE 10.86* 8.70* 9.58* 8.68*  CALCIUM 9.2  --  9.4 9.1  PHOS 4.8*  --  5.4* 4.6   Liver Function Tests: Recent Labs  Lab 05/28/22 0649 05/29/22 0343 05/30/22 0352  AST 21  --   --   ALT 27  --   --   ALKPHOS 101  --   --   BILITOT 1.1  --   --   PROT 8.3*  --   --   ALBUMIN 3.4* 3.2* 3.0*   No results for input(s): "LIPASE", "AMYLASE" in the last 168 hours. CBC: Recent Labs  Lab 05/27/22 0334 05/28/22 0251 05/28/22 1336 05/29/22 0343 05/30/22 0352 05/31/22 0414  WBC 16.1* 14.4*  --  13.7* 13.5* 12.2*  HGB 8.7* 8.4*   < > 8.8* 8.7* 8.8*  HCT 25.8* 24.9*   < > 26.1* 24.1* 23.6*  MCV 102.0* 102.9*  --  104.0* 104.3* 103.5*  PLT 245 245  --  293 285 266   < > = values in this interval not displayed.   Blood Culture    Component Value Date/Time   SDES BLOOD LEFT HAND 03/26/2022 1958   SDES BLOOD  RIGHT HAND 03/26/2022 1958   SPECREQUEST  03/26/2022 1958    BOTTLES DRAWN AEROBIC ONLY Blood Culture adequate volume   SPECREQUEST  03/26/2022 1958    BOTTLES DRAWN AEROBIC ONLY Blood Culture adequate volume   CULT  03/26/2022 1958    NO GROWTH 5 DAYS Performed at [facility], [place] [address], [place], [place] [address]    CULT  03/26/2022 1958    NO GROWTH 5 DAYS Performed at [facility] [address] [place], [place] [address]    REPTSTATUS 03/31/2022 FINAL 03/26/2022 1958   REPTSTATUS 03/31/2022 FINAL 03/26/2022 1958    Cardiac Enzymes: No results for input(s): "CKTOTAL", "CKMB", "CKMBINDEX", "TROPONINI" in the last 168 hours. CBG: No results for input(s): "GLUCAP" in the last 168 hours. Iron Studies: No results for input(s): "IRON", "TIBC", "TRANSFERRIN", "FERRITIN" in the last 72 hours. @lablastinr3 @ Studies/Results: No results found. Medications:  cefTRIAXone (ROCEPHIN)  IV 2 g (05/30/22 1002)   furosemide      (feeding supplement) PROSource Plus  30 mL Oral BID BM  apixaban  5 mg Oral BID   Chlorhexidine Gluconate Cloth  6 each Topical Daily   darbepoetin (ARANESP) injection - DIALYSIS  200 mcg Subcutaneous Q Sat-1800   doxycycline  100 mg Oral Q12H   lanthanum  2,000 mg Oral TID WC   metoprolol succinate  25 mg Oral BID   midodrine  10 mg Oral TID WC   torsemide  100 mg Oral Once per day on Sun Mon Wed Fri    Outpatient Dialysis Orders: [facility] TTS 4:15 425/A1.5x 180 kg 3K/2.5Ca TDC  - Heparin: None/HIT Ab POSITIVE - Mircera 120 q 4 wks (last 10/12) - Hectorol 6 TIW, Sensipar 30 TIW  Assessment/Plan: A-Flutter/RVR: S/p cardioversion 10/28 - initially did ok on amiodarone. Reverted back on 11/2 - changed from amiod to PO metoprolol and s/p another cardioversion 11/3. Cardiology on board.  Respiratory distress - required BiPAP after cardioversion. O2 sats fall with ambulation. Still using BIPAP off and on. Needs ongoing UF with  dialysis. Pneumonia: Per chest CT - covering with IV Ceftriaxone. WBC improving. ESRD: Continue HD per usual TTS schedule - next HD 11/7. BP/Volume: Chronic volume overload. Usually tolerates large UF goals. Midodrine 10mg  TID for BP support. BP lower since addition of metoprolol - unclear if will need to be on this long term. Getting closer to outpatient dry weight, we talked about how overload puts him at risk for recurrent A-fib - he planned to really work on fluid restrictions. Anemia : Hgb 8.8 - Aranesp given 11/4. Transfuse if < 7. Metabolic bone disease - Calcium and phos controlled. Continue Forsrenol binder/Sensipar/Hectorol  Nutrition: Alb low, but improving - continue supps. HFrEF 20-25%   Anice Paganini, PA-C 05/31/2022, 8:34 AM  [facility] Pager: [phone]

## 2022-05-31 NOTE — Progress Notes (Signed)
NAME:  Phillip [name], MRN:  [id number], DOB:  06-06-1986, LOS: 8 ADMISSION DATE:  05/22/2022, CONSULTATION DATE:  05/22/22 REFERRING MD:  Phillip [name], CHIEF COMPLAINT:  SOB   History of Present Illness:  36 year old man well known to our service from prior admission who presents with worsening SOB.  ESRD, reduced EF, morbid obesity, OHS presenting with weight gain, orthopnea, PND, palpitations found to be in afib with hypotension.  Cardioverted and Bps have recovered okay.   He is still dyspneic.   Some concern regarding his BP so PCCM consulted to consider admission  Pertinent  Medical History  Biventricular heart failure ESRD OSA Class V obesity GERD HTN DM2  Significant Hospital Events: Including procedures, antibiotic start and stop dates in addition to other pertinent events   10/28 admit, cardioversion  Interim History / Subjective:  Cough is very bothersome.   Objective   Blood pressure (!) 151/117, pulse 83, temperature 97.7 F (36.5 C), temperature source Axillary, resp. rate 20, height 6' (1.829 m), weight (!) 182.9 kg, SpO2 100 %.        Intake/Output Summary (Last 24 hours) at 05/31/2022 1604 Last data filed at 05/31/2022 1415 Gross per 24 hour  Intake --  Output 900 ml  Net -900 ml    Filed Weights   05/28/22 0550 05/28/22 0628 05/29/22 0751  Weight: (!) 183.2 kg (!) 181.1 kg (!) 182.9 kg    Examination: General: young man sitting at bedside in NAD HENT: Phillip [name]/AT, eyes anicteric Lungs: Breathing comfortably on [place], mild tachypnea but no accessory muscle use. Distant lung sounds.  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 -Check CRP, ESR> if still high, likely would benefit from steroids -Wound NOT rechallenge with amiodarone -Trial of Breo to help with cough; he has been resistant to trying albuterol and this likely would make his tachycardia worse -Recommend repeat CT scan in about 1 month to monitor for resolution. Needs OP PFTs. -If cough is limiting sleep, can try phenergan cough syrup QHS, but it will likely make him fatigued and I recommend against use during the day. Need to monitor his QTc with phenergan.  Phillip [name] and his family were updated after his CT scan.   Best Practice (right click and "Reselect all SmartList Selections" daily)   Per primary  Labs   CBC: Recent Labs  Lab 05/27/22 0334 05/28/22 0251 05/28/22 1336 05/29/22 0343 05/30/22 0352 05/31/22 0414  WBC 16.1* 14.4*  --  13.7* 13.5* 12.2*  HGB 8.7* 8.4* 8.5* 8.8* 8.7* 8.8*  HCT 25.8* 24.9* 25.0* 26.1* 24.1* 23.6*  MCV 102.0* 102.9*  --  104.0* 104.3* 103.5*  PLT 245 245  --  293 285 266     Basic Metabolic Panel: Recent Labs  Lab 05/25/22 0434 05/26/22 0419 05/27/22 0334 05/28/22 1336 05/29/22 0343 05/30/22 0352  NA 134* 134* 134* 133* 135 133*  K 4.6 4.1 4.3 4.1 4.8 4.1  CL 96* 95* 93* 94* 92* 95*  CO2 18* 25 22  --  19* 21*  GLUCOSE 100* 112* 109* 108* 100* 113*  BUN 74* 48* 62* 40* 54* 53*  CREATININE 12.58* 9.37* 10.86* 8.70* 9.58* 8.68*  CALCIUM 9.0 8.9 9.2  --  9.4 9.1  PHOS 7.3* 5.2* 4.8*  --  5.4* 4.6    GFR: Estimated Creatinine Clearance: 19.7 mL/min (A) (by C-G formula based on SCr of 8.68 mg/dL (H)). Recent Labs  Lab 05/28/22 0251 05/29/22 0343 05/30/22 0352 05/31/22 0414  WBC 14.4* 13.7* 13.5* 12.2*     Phillip Hy, DO 05/31/22 4:11 PM [facility]

## 2022-05-31 NOTE — Progress Notes (Signed)
CSW received request to see patient. CSW met with patient and his family at bedside. Patient is requesting a transfer to [facility] due to the complex nature of his medical conditions. He is also requesting assistance from HF on his disability paperwork. CSW alerted HF RNCM.   Gilmore Laroche, MSW, [facility]

## 2022-05-31 NOTE — TOC Initial Note (Signed)
Transition of Care [facility]) - Initial/Assessment Note    Patient Details  Name: Phillip [name] MRN: [id number] Date of Birth: 02/07/1986  Transition of Care [facility]) CM/SW Contact:    Erenest Rasher, RN Phone Number: [phone] 05/31/2022, 5:49 PM  Clinical Narrative:                 HF TOC CM spoke to pt and mother at bedside. Pt is requesting assistance with disability paperwork. Pt will contact Human Resources for paperwork. Pt reports he has been out of work since July.   Expected Discharge Plan: Home/Self Care Barriers to Discharge: Continued Medical Work up   Patient Goals and CMS Choice Patient states their goals for this hospitalization and ongoing recovery are:: wants to get back to work      Expected Discharge Plan and Services Expected Discharge Plan: Home/Self Care       Living arrangements for the past 2 months: Single Family Home   Prior Living Arrangements/Services Living arrangements for the past 2 months: Single Family Home Lives with:: Parents Patient language and need for interpreter reviewed:: Yes Do you feel safe going back to the place where you live?: Yes      Need for Family Participation in Patient Care: No (Comment) Care giver support system in place?: Yes (comment) Current home services: DME ([employer]), oxygen ([place])) Criminal Activity/Legal Involvement Pertinent to Current Situation/Hospitalization: No - Comment as needed  Activities of Daily Living Home Assistive Devices/Equipment: BIPAP ADL Screening (condition at time of admission) Patient's cognitive ability adequate to safely complete daily activities?: Yes Is the patient deaf or have difficulty hearing?: No Does the patient have difficulty seeing, even when wearing glasses/contacts?: No Does the patient have difficulty concentrating, remembering, or making decisions?: No Patient able to express need for assistance with ADLs?: No Does the patient have difficulty dressing  or bathing?: No Independently performs ADLs?: Yes (appropriate for developmental age) Does the patient have difficulty walking or climbing stairs?: No Weakness of Legs: None Weakness of Arms/Hands: None  Permission Sought/Granted Permission sought to share information with : Case Manager, Family Supports, PCP Permission granted to share information with : Yes, Verbal Permission Granted  Share Information with NAME: Phillip [name]     Permission granted to share info w Relationship: mother  Permission granted to share info w Contact Information: [phone]  Emotional Assessment Appearance:: Appears stated age Attitude/Demeanor/Rapport: Gracious Affect (typically observed): Accepting Orientation: : Oriented to Self, Oriented to Place, Oriented to  Time, Oriented to Situation   Psych Involvement: No (comment)  Admission diagnosis:  Typical atrial flutter ([name]) [I48.3] Acute on chronic respiratory failure with hypoxia ([name]) [J96.21] Acute hypoxic respiratory failure ([place]) [J96.01] Patient Active Problem List   Diagnosis Date Noted   Multiple nodules of lung 05/31/2022   Acute hypoxic respiratory failure ([place]) 05/22/2022   Hospital discharge follow-up 04/09/2022   Goals of care, counseling/discussion    Recurrent infections 09/30/2021   Other fatigue 09/30/2021   Pruritic rash 09/17/2021   Reactive depression 09/02/2021   Hypoxemia 09/02/2021   Abscess of iliac fossa 08/26/2021   Staphylococcal arthritis ([place]) 08/26/2021   Healthcare maintenance 08/26/2021   Sacral osteomyelitis ([place]) 08/26/2021   Obesity 08/26/2021   Volume overload 08/18/2021   Palliative care by specialist    Full code status    Concern about end of life    Medical non-compliance 04/21/2021   Hypotension 04/21/2021   MSSA bacteremia    ESRD (end stage renal disease)  on dialysis ([place]) 04/16/2021   Paroxysmal atrial flutter ([place]) 04/12/2021   Acute respiratory failure with hypoxia ([name]) 04/12/2021    Hyponatremia 04/12/2021   Dyspnea    Elevated troponin 02/11/2021   Chronic respiratory failure with hypoxia ([place]) 02/11/2021   CAP (community acquired pneumonia) 19/14/7829   Chronic systolic CHF (congestive heart failure) ([place])    Hypokalemia    Chronic cough    Morbid obesity ([place]) 02/26/2020   GERD without esophagitis 02/26/2020   Diuretic-induced hypokalemia 02/26/2020   OSA (obstructive sleep apnea) 02/26/2020   Hidradenitis suppurativa 02/26/2020   Prediabetes 02/26/2020   PCP:  [name], MD Pharmacy:   [facility] [phone] - [place], [place] - [address] [address] Phone: [phone] Fax: [phone]     Social Determinants of Health (SDOH) Interventions Housing Interventions: Intervention Not Indicated  Readmission Risk Interventions    05/12/2021   10:35 AM 10/16/2020    2:10 PM 10/16/2020    1:57 PM  Readmission Risk Prevention Plan  Transportation Screening Complete Complete Complete  PCP or Specialist Appt within 5-7 Days  Complete Complete  Home Care Screening  Complete   Medication Review (RN CM)  Referral to Pharmacy   Medication Review (RN Care Manager) Complete    PCP or Specialist appointment within 3-5 days of discharge Complete    HRI or [place] Complete    SW Recovery Care/Counseling Consult Complete    Palliative Care Screening Complete    Skilled Nursing Facility Complete

## 2022-05-31 NOTE — Progress Notes (Addendum)
Mobility Specialist Progress Note:   05/31/22 1040  Mobility  Activity Stood at bedside (x2)  Level of Assistance Standby assist, set-up cues, supervision of patient - no hands on  Assistive Device None  Activity Response Tolerated poorly  Mobility Referral Yes  $Mobility charge 1 Mobility   Pt agreeable to mobility session despite not feeling well this am. Required no physical assistance to stand EOB. Upon standing, pt became extremely lightheaded, and required seated rest to recover. Second attempt resulted in same. SpO2 85-94% on RA (pt kept [place] off per request); HR 130s AFib. Further mobility deferred. Pt left sitting EOB with all needs met.     Acute Rehab Secure Chat or Office Phone: [phone]  

## 2022-05-31 NOTE — Progress Notes (Addendum)
Patient ID: Phillip [name], male   DOB: 1986/01/19, 36 y.o.   MRN: [id number] P    Advanced Heart Failure Rounding Note  PCP-Cardiologist: [name], MD   Subjective:    Admitted w/ AFL w/ RVR + acute hypoxic respiratory failure due to PNA vs amiodarone lung toxicity.   10/28: emergent DCCV in ED>>NSR 11/2: reverted back to AFL w/ RVR,. PO amio discontinued. Started metoprolol 12.103m q6h. Abx started for PNA 11/3: DCCV to NSR  Back in fib/flutter this morning. Cough better. On nasal cannula 6L, weaned to 4. Dropped to high 80s when 3L attempted.   He continues on PNA coverage with ceftriaxone/doxycycline.   CT chest: mass-like consolidations and clustered areas of nodularity bilaterally, multifocal PNA or drug toxicity.   Objective:   Weight Range: (!) 182.9 kg Body mass index is 54.69 kg/m.   Vital Signs:   Temp:  [97.8 F (36.6 C)-98.6 F (37 C)] 97.8 F (36.6 C) (11/06 0437) Pulse Rate:  [86-133] 133 (11/06 0437) Resp:  [16-20] 20 (11/05 2321) BP: (91-105)/(56-71) 91/71 (11/06 0437) SpO2:  [96 %-99 %] 96 % (11/06 0437) Last BM Date : 05/27/22  Weight change: Filed Weights   05/28/22 0550 05/28/22 0628 05/29/22 0751  Weight: (!) 183.2 kg (!) 181.1 kg (!) 182.9 kg    Intake/Output:   Intake/Output Summary (Last 24 hours) at 05/31/2022 0755 Last data filed at 05/31/2022 0400 Gross per 24 hour  Intake --  Output 400 ml  Net -400 ml      Physical Exam    General:  obese appearing.  No respiratory difficulty HEENT: normal Neck: supple. No JVD. Carotids 2+ bilat; no bruits. No lymphadenopathy or thyromegaly appreciated. Cor: PMI nondisplaced. Regular rate & rhythm. No rubs, gallops or murmurs. Lungs: shallow breaths, diminished throughout  Abdomen: soft, nontender, nondistended. No hepatosplenomegaly. No bruits or masses. Good bowel sounds. Extremities: no cyanosis, clubbing, rash, edema  Neuro: alert & oriented x 3, cranial nerves grossly intact. moves all  4 extremities w/o difficulty. Affect pleasant.   Telemetry   Atrial flutter 130s (personally reviewed)  EKG    No new EKG to review   Labs    CBC Recent Labs    05/30/22 0352 05/31/22 0414  WBC 13.5* 12.2*  HGB 8.7* 8.8*  HCT 24.1* 23.6*  MCV 104.3* 103.5*  PLT 285 2951  Basic Metabolic Panel Recent Labs    05/29/22 0343 05/30/22 0352  NA 135 133*  K 4.8 4.1  CL 92* 95*  CO2 19* 21*  GLUCOSE 100* 113*  BUN 54* 53*  CREATININE 9.58* 8.68*  CALCIUM 9.4 9.1  PHOS 5.4* 4.6   Liver Function Tests Recent Labs    05/29/22 0343 05/30/22 0352  ALBUMIN 3.2* 3.0*   No results for input(s): "LIPASE", "AMYLASE" in the last 72 hours. Cardiac Enzymes No results for input(s): "CKTOTAL", "CKMB", "CKMBINDEX", "TROPONINI" in the last 72 hours.  BNP: BNP (last 3 results) Recent Labs    02/18/22 0154 03/26/22 1958 05/22/22 0732  BNP 373.8* 374.2* 373.1*    ProBNP (last 3 results) No results for input(s): "PROBNP" in the last 8760 hours.   D-Dimer No results for input(s): "DDIMER" in the last 72 hours. Hemoglobin A1C No results for input(s): "HGBA1C" in the last 72 hours. Fasting Lipid Panel No results for input(s): "CHOL", "HDL", "LDLCALC", "TRIG", "CHOLHDL", "LDLDIRECT" in the last 72 hours. Thyroid Function Tests No results for input(s): "TSH", "T4TOTAL", "T3FREE", "THYROIDAB" in the last 72 hours.  Invalid  input(s): "FREET3"   Other results:   Imaging    No results found.   Medications:     Scheduled Medications:  (feeding supplement) PROSource Plus  30 mL Oral BID BM   apixaban  5 mg Oral BID   Chlorhexidine Gluconate Cloth  6 each Topical Daily   darbepoetin (ARANESP) injection - DIALYSIS  200 mcg Subcutaneous Q Sat-1800   doxycycline  100 mg Oral Q12H   lanthanum  2,000 mg Oral TID WC   metoprolol succinate  25 mg Oral BID   midodrine  10 mg Oral TID WC   torsemide  100 mg Oral Once per day on Sun Mon Wed Fri    Infusions:   cefTRIAXone (ROCEPHIN)  IV 2 g (05/30/22 1002)   furosemide      PRN Medications: acetaminophen **OR** acetaminophen, polyethylene glycol, senna-docusate    Patient Profile  36 y/o male w/ chronic biventricular heart failure due to NICM, PAF/AFL s/p multiple DCCVs, multiple intolerances to amiodarone, ESRD on iHD, OSA on CPAP and morbid obesity, admitted for acute hypoxic respiratory failure and hypotension in the setting of recurrent ALF w/ RVR, requiring emergent DCCV in ED. Now w/ concern for potential amiodarone pulmonary toxicity. Also being treated for possible PNA.  Assessment/Plan  1. Atrial flutter: History of multiple DCCVs. Amiodarone previously stopped due to elevated LFTs. Restarted 05/20/22 by Dr. Curt Bears.  Admitted w/ hemodynamically unstable AFL w/ RVR requiring emergent DCCV>>NSR in ER.  Atrial flutter recurred with RVR post-DCCV.  Now w/ concern for possible amiodarone pulmonary toxicity w/ elevated ESR 113 and abnormal high resolution chest CT showing mass-like consolidations and clustered areas of nodularity bilaterally, multifocal PNA or drug toxicity. Amiodarone stopped (he says breathing got worse after restarting on amiodarone).  We did repeat DCCV on 11/3, was in NSR until this morning, back in flutter.  - Continue Toprol XL 25 mg bid, no room to titrate as his BP is soft.  - He does not tolerate atrial flutter well w/ his HF and has had previous presentations w/ shock to due atrial flutter. Most recent LVEF 20-25%. Mantanence of NSR is imperative. Unfortunately, no other alternative pharmacological rhythm control strategies. Asked EP to reconsider atrial flutter ablation, understanding it is high risk but he is young and options are very limited. EP group to discuss.  - Continue Eliquis 5 mg bid  2.  Chronic systolic CHF: Nonischemic CMP, LHC 2021 with no CAD. Previous hospitalizations w/shock in setting of rapid AFib/Flutter requiring milrinone. Most recent echo 8/23 with  EF 20-25%, RV mildly reduced.  - ESRD, volume controlled by HD (had yesterday) - GDMT limited by CKD and hypotension requiring midodrine  - Not candidate for ICD given obesity and dialysis  - Not candidate for advanced therapies.  3. Acute Hypoxic Respiratory Failure: Combination of volume overload and multifocal PNA.  High resolution CT chest showed mass-like consolidations and clustered areas of nodularity bilaterally, multifocal PNA or drug toxicity. Unclear if lung opacifications are infectious or inflammatory.  - Continue ceftriaxone + doxycyline per primary team  - Amiodarone has been stopped.  - Follow response to abx. If fails to improve and c/w O2 requirements, may need trial of steroids. Pulmonary has seen.  4. ESRD: HD per nephrology  5. Obesity: Body mass index is 55.64 kg/m.  - wt loss advised, doesn't seem to be compliant. Eating [employer] biscuit and asking for orange juice this morning stated "a little bit won't hurt me", advised to not drink OJ and  watch overall intake 6. Fe deficiency anemia.  - T-sat < 20%. Received Ferrlecit per primary team  7. OSA: Continue CPAP QHS  Length of Stay: [place] AGACNP-BC  05/31/2022, 7:55 AM  Advanced Heart Failure Team Pager [phone] (M-F; 7a - 5p)  Please contact [facility] for night-coverage after hours (5p -7a ) and weekends on [email or web address]  Patient seen with NP, agree with the above note.   He went back into atrial flutter overnight, HR in 100s on Toprol XL 25 mg bid.   Breathing worse in atrial flutter, currently wearing his Bipap.   General: NAD Neck: JVP difficult, no thyromegaly or thyroid nodule.  Lungs: Clear to auscultation bilaterally with normal respiratory effort. CV: Nondisplaced PMI.  Heart mildly tachy, irregular S1/S2, no S3/S4, no murmur.  No peripheral edema.   Abdomen: Soft, nontender, no hepatosplenomegaly, no distention.  Skin: Intact without lesions or rashes.  Neurologic: Alert and oriented x  3.  Psych: Normal affect. Extremities: No clubbing or cyanosis.  HEENT: Normal.   Atrial flutter has recurred after most recent DCCV on Friday.   - Continue Toprol XL 25 mg bid.  - Not Tikosyn candidate with ESRD.  - I do not think that he is going to be amiodarone candidate due to concerns for prior hepatotoxicity and now possible pulmonary toxicity with high ESR and multifocal lung infiltrates.  Asked pulmonary to take a look at him again to comment on likelihood of amiodarone lung toxicity.  - I think that our best option will likely be a high risk atrial flutter ablation.  Discussed with EP PA.  Dr. Curt Bears, his regular EP physician, will be in hospital tomorrow and we will need to make decision.  - Repeat ECG to confirm rhythm.   HD today.   [name] 05/31/2022 9:27 AM

## 2022-05-31 NOTE — Progress Notes (Signed)
Subjective:  Patient reports an improvement in breathing since admission, but reports some concerns about his heart medications. Discussed current priority of controlling his atrial fibrillation. Cardiology will determine best path forward with respect to possible ablation.    Objective: Vitals over previous 24hr: Vitals:   05/30/22 1700 05/30/22 2321 05/31/22 0437 05/31/22 0951  BP: 105/65  91/71 (!) 151/117  Pulse: 91 86 (!) 133 83  Resp: 16 20  20   Temp:   97.8 F (36.6 C) 97.7 F (36.5 C)  TempSrc:   Oral Axillary  SpO2: 98% 98% 96% 100%  Weight:      Height:        General:      awake and alert, sitting comfortably in bed, cooperative, not in acute distress Skin:       warm and dry, intact without any obvious lesions or scars, no rashes Head:      normocephalic and atraumatic, oral mucosa moist Lungs:      normal respiratory effort, breathing unlabored, symmetrical chest rise, slightly decreased sounds in lower fields but generally difficult to appreciate given body habitus, no crackles or wheezing Cardiac:      regular rate and rhythm, normal S1 and S2 Neurologic:      oriented to person-place-time, moving all extremities, no gross focal deficits Psychiatric:      mood and affect normal, intelligible speech    Assessment/Plan: Phillip [name] is a 36 year old male with a past medical history of morbid obesity, HFrEF, OSA, AFib-AFlutter who presented with breath shortness, lightheadedness, and palpitations, now admitted for chronic hypoxic respiratory failure and atrial fibrillation.    ---Acute on chronic hypoxic respiratory failure ---Community acquired pneumonia ---Obstructive sleep apnea on nightly BiPAP ---Obesity hypoventilation syndrome Patient presented on 10-28 with breath shortness, found to have atrial flutter rapid ventricular response and hypotension. Chest radiograph revealed evidence of pulmonary venous congestion and CT showed bilateral consolidations  concerning for pneumonia. Etiology likely multifactorial including pneumonia and volume overload in setting of HFrEF plus poorly controlled atrial fibrillation. Five-day course of ceftriaxone and doxycycline were completed on 11-6. Volume status has been managed with hemodialysis throughout hospitalization and oxygen requirements have steadily decreased since admission. > Administer BiPAP nightly > Volume management with hemodialysis > Wean oxygen requirements as tolerated > Trend CBC q24   ---End-stage renal disease on hemodialysis Patient missed hemodialysis session on 10-28 prior to ED arrival. Estimated dry weight around 175-180kg, currently at 183kg. Volume overload likely contributing to respiratory failure, managed with hemodialysis per his usual Tu-Th-Sa schedule. > Nephrology consult, appreciate recommendations > Hemodialysis on Tu-Th-Sa > Torsemide 100mg  on Mo-We-Fr-Su > Trend BMP q24   ---Heart failure with reduced ejection fraction ---Hypotension Echocardiogram performed 02-2022 demonstrated ejection fraction of 20-25% with history negative for an ischemic event. Patient presented with lightheadedness and breath shortness. Upon arrival, troponin levels were slightly elevated and BNP at baseline. Medical management of heart failure is limited due to ESRD and hypotension requiring midodrine. An ICD is also contraindicated given hemodialysis needs and morbid obesity status. > Cardiology consult, appreciate recommendations > Midodrine 10mg  q8 > Metoprolol 25mg  q12 > Torsemide 100mg  on Mo-We-Fr-Su   ---Atrial flutter post cardioversion x2 Patient presented on 10-28 with symptomatic tachyarrhythmia and hypotension, found to have atrial flutter plus rapid ventricular response. Atrial flutter initially resolved after cardioversion, but episodes have since returned despite a second attempt. Internal medicine team is currently holding amiodarone given possibility of pulmonary toxicity.  Since treatment options are limited, high-risk ablation  may represent the best path forward. Cardiology and electrophysiology teams are planning to discuss case tomorrow 11-7. > Cardiology consult, appreciate recommendations > Apixaban 5mg  q24   ---Iron deficiency anemia Laboratory testing in 03-2022 consistent with iron deficiency anemia. Upon arrival, hemoglobin was 8.8 and has been stable since admission. > Consider ferrous sulfate supplement upon discharge     Principal Problem:   Acute hypoxic respiratory failure ([name]) Active Problems:   OSA (obstructive sleep apnea)   Chronic systolic CHF (congestive heart failure) ([name])   Paroxysmal atrial flutter ([name])   ESRD (end stage renal disease) on dialysis ([place])   Hypotension    Prior to Admission Living Arrangement: home Anticipated Discharge Location: home Barriers to Discharge: medical management Dispo: Anticipated discharge in approximately 2-3 day(s).    Phillip Nickel, MD Internal Medicine PGY-1 Pager [phone]  After 5pm on weekdays and 1pm on weekends: On Call pager [phone]

## 2022-06-01 ENCOUNTER — Telehealth (HOSPITAL_COMMUNITY): Payer: Self-pay | Admitting: Pharmacy Technician

## 2022-06-01 ENCOUNTER — Other Ambulatory Visit (HOSPITAL_COMMUNITY): Payer: Self-pay

## 2022-06-01 DIAGNOSIS — I5022 Chronic systolic (congestive) heart failure: Secondary | ICD-10-CM | POA: Diagnosis not present

## 2022-06-01 DIAGNOSIS — I4892 Unspecified atrial flutter: Secondary | ICD-10-CM | POA: Diagnosis not present

## 2022-06-01 DIAGNOSIS — J9621 Acute and chronic respiratory failure with hypoxia: Secondary | ICD-10-CM

## 2022-06-01 DIAGNOSIS — Z20822 Contact with and (suspected) exposure to covid-19: Secondary | ICD-10-CM | POA: Diagnosis not present

## 2022-06-01 DIAGNOSIS — I4819 Other persistent atrial fibrillation: Secondary | ICD-10-CM | POA: Diagnosis not present

## 2022-06-01 DIAGNOSIS — R700 Elevated erythrocyte sedimentation rate: Secondary | ICD-10-CM

## 2022-06-01 DIAGNOSIS — I5023 Acute on chronic systolic (congestive) heart failure: Secondary | ICD-10-CM | POA: Diagnosis not present

## 2022-06-01 DIAGNOSIS — R7982 Elevated C-reactive protein (CRP): Secondary | ICD-10-CM

## 2022-06-01 DIAGNOSIS — J189 Pneumonia, unspecified organism: Secondary | ICD-10-CM | POA: Diagnosis not present

## 2022-06-01 DIAGNOSIS — I483 Typical atrial flutter: Secondary | ICD-10-CM | POA: Diagnosis not present

## 2022-06-01 DIAGNOSIS — R918 Other nonspecific abnormal finding of lung field: Secondary | ICD-10-CM | POA: Diagnosis not present

## 2022-06-01 LAB — BASIC METABOLIC PANEL
Anion gap: 19 — ABNORMAL HIGH (ref 5–15)
BUN: 73 mg/dL — ABNORMAL HIGH (ref 6–20)
CO2: 20 mmol/L — ABNORMAL LOW (ref 22–32)
Calcium: 8.5 mg/dL — ABNORMAL LOW (ref 8.9–10.3)
Chloride: 97 mmol/L — ABNORMAL LOW (ref 98–111)
Creatinine, Ser: 11.73 mg/dL — ABNORMAL HIGH (ref 0.61–1.24)
GFR, Estimated: 5 mL/min — ABNORMAL LOW (ref >60)
Glucose, Bld: 113 mg/dL — ABNORMAL HIGH (ref 70–99)
Potassium: 3.4 mmol/L — ABNORMAL LOW (ref 3.5–5.1)
Sodium: 136 mmol/L (ref 135–145)

## 2022-06-01 LAB — CBC
HCT: 23.3 % — ABNORMAL LOW (ref 39.0–52.0)
Hemoglobin: 8.2 g/dL — ABNORMAL LOW (ref 13.0–17.0)
MCH: 36.9 pg — ABNORMAL HIGH (ref 26.0–34.0)
MCHC: 35.2 g/dL (ref 30.0–36.0)
MCV: 105 fL — ABNORMAL HIGH (ref 80.0–100.0)
Platelets: 310 10*3/uL (ref 150–400)
RBC: 2.22 MIL/uL — ABNORMAL LOW (ref 4.22–5.81)
RDW: 14.6 % (ref 11.5–15.5)
WBC: 11.2 10*3/uL — ABNORMAL HIGH (ref 4.0–10.5)
nRBC: 0 % (ref 0.0–0.2)

## 2022-06-01 MED ORDER — PREDNISONE 20 MG PO TABS
40.0000 mg | ORAL_TABLET | Freq: Every day | ORAL | Status: DC
  Administered 2022-06-02 – 2022-06-10 (×9): 40 mg via ORAL
  Filled 2022-06-01 (×10): qty 2

## 2022-06-01 MED ORDER — HEPARIN SODIUM (PORCINE) 1000 UNIT/ML IJ SOLN
INTRAMUSCULAR | Status: AC
  Filled 2022-06-01: qty 5

## 2022-06-01 NOTE — Progress Notes (Addendum)
Patient ID: Phillip Young, male   DOB: 03-07-1986, 36 y.o.   MRN: [id number] P    Advanced Heart Failure Rounding Note  PCP-Cardiologist: [name], MD   Subjective:    Admitted w/ AFL w/ RVR + acute hypoxic respiratory failure due to PNA vs amiodarone lung toxicity.   10/28: emergent DCCV in ED>>NSR 11/2: reverted back to AFL w/ RVR,. PO amio discontinued. Started metoprolol 12.37m q6h. Abx started for PNA 11/3: DCCV to NSR 11/6: Back in AFL. Completed PNA coverage with ceftriaxone/doxycycline. Repeat ESR higher 113>>135   Remains SOB and in AFL w/ RVR 140s. Awaiting decision regarding ablation by EP.   Getting iHD currently.   CT chest: mass-like consolidations and clustered areas of nodularity bilaterally, multifocal PNA or drug toxicity.   Objective:   Weight Range: (!) 184.2 kg Body mass index is 55.08 kg/m.   Vital Signs:   Temp:  [97.7 F (36.5 C)-98.8 F (37.1 C)] 98.8 F (37.1 C) (11/07 0752) Pulse Rate:  [83-111] 111 (11/07 0825) Resp:  [17-30] 30 (11/07 0825) BP: (96-151)/(61-117) 96/80 (11/07 0825) SpO2:  [100 %] 100 % (11/07 0825) Weight:  [184.2 kg] 184.2 kg (11/07 0752) Last BM Date : 05/27/22  Weight change: Filed Weights   05/28/22 0628 05/29/22 0751 06/01/22 0752  Weight: (!) 181.1 kg (!) 182.9 kg (!) 184.2 kg    Intake/Output:   Intake/Output Summary (Last 24 hours) at 06/01/2022 0833 Last data filed at 05/31/2022 1415 Gross per 24 hour  Intake --  Output 500 ml  Net -500 ml      Physical Exam    General:  obese young male.  No respiratory difficulty HEENT: normal Neck: supple. thick neck, JVD not well visualized Carotids 2+ bilat; no bruits. No lymphadenopathy or thyromegaly appreciated. Cor: PMI nondisplaced. Irregularly irregular rhythm & rhythm. No rubs, gallops or murmurs. Lungs: shallow breaths, diminished throughout  Abdomen: obese, soft, nontender, nondistended. No hepatosplenomegaly. No bruits or masses. Good bowel  sounds. Extremities: no cyanosis, clubbing, rash, edema  Neuro: alert & oriented x 3, cranial nerves grossly intact. moves all 4 extremities w/o difficulty. Affect pleasant.   Telemetry   Atrial flutter 130s-150s (personally reviewed)  EKG    No new EKG to review   Labs    CBC Recent Labs    05/31/22 0414 06/01/22 0426  WBC 12.2* 11.2*  HGB 8.8* 8.2*  HCT 23.6* 23.3*  MCV 103.5* 105.0*  PLT 266 3937  Basic Metabolic Panel Recent Labs    05/30/22 0352 06/01/22 0426  NA 133* 136  K 4.1 3.4*  CL 95* 97*  CO2 21* 20*  GLUCOSE 113* 113*  BUN 53* 73*  CREATININE 8.68* 11.73*  CALCIUM 9.1 8.5*  PHOS 4.6  --    Liver Function Tests Recent Labs    05/30/22 0352  ALBUMIN 3.0*   No results for input(s): "LIPASE", "AMYLASE" in the last 72 hours. Cardiac Enzymes No results for input(s): "CKTOTAL", "CKMB", "CKMBINDEX", "TROPONINI" in the last 72 hours.  BNP: BNP (last 3 results) Recent Labs    02/18/22 0154 03/26/22 1958 05/22/22 0732  BNP 373.8* 374.2* 373.1*    ProBNP (last 3 results) No results for input(s): "PROBNP" in the last 8760 hours.   D-Dimer No results for input(s): "DDIMER" in the last 72 hours. Hemoglobin A1C No results for input(s): "HGBA1C" in the last 72 hours. Fasting Lipid Panel No results for input(s): "CHOL", "HDL", "LDLCALC", "TRIG", "CHOLHDL", "LDLDIRECT" in the last 72 hours. Thyroid Function  Tests No results for input(s): "TSH", "T4TOTAL", "T3FREE", "THYROIDAB" in the last 72 hours.  Invalid input(s): "FREET3"   Other results:   Imaging    CT CHEST WO CONTRAST  Result Date: 05/31/2022 CLINICAL DATA:  Airspace opacities in the lungs EXAM: CT CHEST WITHOUT CONTRAST TECHNIQUE: Multidetector CT imaging of the chest was performed following the standard protocol without IV contrast. RADIATION DOSE REDUCTION: This exam was performed according to the departmental dose-optimization program which includes automated exposure control,  adjustment of the mA and/or kV according to patient size and/or use of iterative reconstruction technique. COMPARISON:  05/26/2022 FINDINGS: Cardiovascular: Right IJ catheter tip: Right atrium. Moderate cardiomegaly. Minimal atherosclerotic calcification of the aortic arch. Mediastinum/Nodes: Roughly similar degree of thoracic adenopathy, AP window lymph node 1.9 cm and right paratracheal lymph node 1.7 cm on images 26 and 23 of series 3, respectively. Mildly prominent bilateral level IV lymph nodes in the neck are stable. Lungs/Pleura: Hazy ground-glass opacities in both lungs similar to previous. There is some minimal areas of suspected air trapping in both lungs. Similar regions of bilateral airspace opacities favoring the lower lobes, some areas mildly worsened and some improved from previous. There is some confluent reticulonodular opacities peripherally in the right lower lobe in a rib and scattered wedge-shaped airspace opacities bilaterally mostly fairly similar to prior. Upper Abdomen: Unremarkable Musculoskeletal: Unremarkable IMPRESSION: 1. Similar degree of hazy ground-glass opacities in both lungs with some minimal areas of suspected air trapping in both lungs. 2. Similar regions of scattered bilateral airspace opacities favoring the lower lobes, some areas mildly worsened and some improved from previous. 3. Roughly similar degree of thoracic adenopathy. 4. Moderate cardiomegaly. 5. Minimal atherosclerotic calcification of the aortic arch. Electronically Signed   By: Van Clines M.D.   On: 05/31/2022 15:47     Medications:     Scheduled Medications:  (feeding supplement) PROSource Plus  30 mL Oral BID BM   apixaban  5 mg Oral BID   Chlorhexidine Gluconate Cloth  6 each Topical Daily   Chlorhexidine Gluconate Cloth  6 each Topical Q0600   darbepoetin (ARANESP) injection - DIALYSIS  200 mcg Subcutaneous Q Sat-1800   fluticasone furoate-vilanterol  1 puff Inhalation Daily   lanthanum   2,000 mg Oral TID WC   metoprolol succinate  25 mg Oral BID   midodrine  10 mg Oral TID WC   torsemide  100 mg Oral Once per day on Sun Mon Wed Fri    Infusions:    PRN Medications: acetaminophen **OR** acetaminophen, polyethylene glycol, senna-docusate    Patient Profile   36 y/o male w/ chronic biventricular heart failure due to NICM, PAF/AFL s/p multiple DCCVs, multiple intolerances to amiodarone, ESRD on iHD, OSA on CPAP and morbid obesity, admitted for acute hypoxic respiratory failure and hypotension in the setting of recurrent ALF w/ RVR, requiring emergent DCCV in ED. Now w/ concern for potential amiodarone pulmonary toxicity. Also being treated for possible PNA.   Assessment/Plan   1. Atrial flutter: History of multiple DCCVs. Amiodarone previously stopped due to elevated LFTs. Restarted 05/20/22 by Dr. Curt Bears.  Admitted w/ hemodynamically unstable AFL w/ RVR requiring emergent DCCV>>NSR in ER.  Atrial flutter recurred with RVR post-DCCV.  Now w/ concern for possible amiodarone pulmonary toxicity w/ elevated ESR 113 and abnormal high resolution chest CT showing mass-like consolidations and clustered areas of nodularity bilaterally, multifocal PNA or drug toxicity. Amiodarone stopped (he says breathing got worse after restarting on amiodarone).  We did repeat  DCCV on 11/3, was in NSR until 11/6, back in flutter. V-rates 130s-150s.  - Continue Toprol XL 25 mg bid, no room to titrate as his BP is soft.  - He does not tolerate atrial flutter well w/ his HF and has had previous presentations w/ shock to due atrial flutter. Most recent LVEF 20-25%. Mantanence of NSR is imperative. Unfortunately, no other alternative pharmacological rhythm control strategies. Asked EP to reconsider atrial flutter ablation, understanding it is high risk but he is young and options are very limited. EP group to discuss.  - Continue Eliquis 5 mg bid  2.  Chronic systolic CHF: Nonischemic CMP, LHC 2021 with  no CAD. Previous hospitalizations w/shock in setting of rapid AFib/Flutter requiring milrinone. Most recent echo 8/23 with EF 20-25%, RV mildly reduced.  - ESRD, volume controlled by HD  - GDMT limited by CKD and hypotension requiring midodrine  - Not candidate for ICD given obesity and dialysis  - Not candidate for advanced therapies.  3. Acute Hypoxic Respiratory Failure: Combination of volume overload and multifocal PNA.  High resolution CT chest showed mass-like consolidations and clustered areas of nodularity bilaterally, multifocal PNA or drug toxicity. Unclear if lung opacifications are infectious or inflammatory. Favor APT as most likely etiology. Minimal improvement after completion of abx for possible PNA. C/w supp O2 requirements. Repeat ESR higher 113>>135  - completed ceftriaxone + doxycyline x 5 days  - Amiodarone has been stopped - would likely benefit from steroids - needs outpatient PFTs and repeat CT scan in about 1 month  - will follow w/ [facility], appreciate their assistance  4. ESRD: HD per nephrology  - on T,Th,Sat schedule  5. Obesity: Body mass index is 55.64 kg/m.  - wt loss advised 6. Fe deficiency anemia.  - T-sat < 20%. Received Ferrlecit per primary team  7. OSA: Continue CPAP QHS  Length of Stay: 9  Brittainy Simmons PA-C  06/01/2022, 8:33 AM  Advanced Heart Failure Team Pager [phone] (M-F; 7a - 5p)  Please contact [facility] for night-coverage after hours (5p -7a ) and weekends on [email or web address]  Patient seen with PA, agree with the above note.   ESR 135, CRP 11.5.  CT chest still with bilateral airspace disease.  He completed course of ceftriaxone/doxycycline.   HR up to 140s at HD in atrial flutter, currently 120s.  He did not get Toprol XL this morning.   General: NAD, obese. Neck: Thick, JVP difficult, no thyromegaly or thyroid nodule.  Lungs: Decreased BS at bases.  CV: Nondisplaced PMI.  Heart tachy, irregular S1/S2, no  S3/S4, no murmur.  No peripheral edema.   Abdomen: Soft, nontender, no hepatosplenomegaly, no distention.  Skin: Intact without lesions or rashes.  Neurologic: Alert and oriented x 3.  Psych: Normal affect. Extremities: No clubbing or cyanosis.  HEENT: Normal.   Patient has uncontrolled atrial flutter. He tolerates this poorly with CHF.  Failed DCCV x 2 this admission.  No anti-arrhythmic option.  Discussed with EP, will plan for atrial flutter ablation (today versus Friday depending on lab time).  He will need anesthesia and LMA with procedure.   Suspect amiodarone lung toxicity, pulmonary following.  He has completed a course of abx with no improvement in CT chest or inflammatory markers.  - start prednisone 40 mg daily today.  Will follow with pulmonary as outpatient.   Continue Toprol XL 25 mg bid (do not hold doses) and midodrine to maintain MAP.   [name] 06/01/2022  9:45 AM

## 2022-06-01 NOTE — Progress Notes (Signed)
Patient ID: Phillip Young, male   DOB: 1985/09/19, 36 y.o.   MRN: [id number] [facility] Progress Note   Assessment/ Plan:   1. Atrial flutter with RVR: S/P cardioversion on 10/28 and then again on 11/3 with discontinuation of amiodarone with CT chest concerning for amiodarone lung. Plans noted for ablation likely today vs Friday based on availability of lab.  2. ESRD: On TTS dialysis schedule and was on hemodialysis earlier but discontinued after he became more tachycardic after getting more information about his heart/lungs. Will re-attempt HD again tomorrow.   3. Anemia:Low H/H without overt loss, will continue ESA.  4. CKD-MBD: Calcium level at goal with phosphorus level acceptable on lanthanum.  5. Nutrition:continue renal diet and add ONS as indicated.  6. Hypotension:Blood pressure acceptable on midodrine with metoprolol for cardiac rate control.   Subjective:   Upset on learning about having possible amiodarone lung and the need for cardiac ablation.   Objective:   BP (!) 110/48   Pulse (!) 158   Temp 98.8 F (37.1 C)   Resp (!) 30   Ht 6' (1.829 m)   Wt (!) 184.2 kg   SpO2 100%   BMI 55.08 kg/m   Physical Exam: NWG:NFAOZHY upset resting in hemodialysis QMV:HQION irregular tachycardia, normal s1 and s2 Resp:decreased breath sounds bilaterally, no distinct rales/rhonchi. RIJ TDC GEX:BMWU, obese, non-tender Ext:No edema over lower extremities.  Labs: BMET Recent Labs  Lab 05/26/22 0419 05/27/22 0334 05/28/22 1336 05/29/22 0343 05/30/22 0352 06/01/22 0426  NA 134* 134* 133* 135 133* 136  K 4.1 4.3 4.1 4.8 4.1 3.4*  CL 95* 93* 94* 92* 95* 97*  CO2 25 22  --  19* 21* 20*  GLUCOSE 112* 109* 108* 100* 113* 113*  BUN 48* 62* 40* 54* 53* 73*  CREATININE 9.37* 10.86* 8.70* 9.58* 8.68* 11.73*  CALCIUM 8.9 9.2  --  9.4 9.1 8.5*  PHOS 5.2* 4.8*  --  5.4* 4.6  --    CBC Recent Labs  Lab 05/29/22 0343 05/30/22 0352 05/31/22 0414 06/01/22 0426  WBC  13.7* 13.5* 12.2* 11.2*  HGB 8.8* 8.7* 8.8* 8.2*  HCT 26.1* 24.1* 23.6* 23.3*  MCV 104.0* 104.3* 103.5* 105.0*  PLT 293 285 266 310      Medications:     (feeding supplement) PROSource Plus  30 mL Oral BID BM   apixaban  5 mg Oral BID   Chlorhexidine Gluconate Cloth  6 each Topical Daily   Chlorhexidine Gluconate Cloth  6 each Topical Q0600   darbepoetin (ARANESP) injection - DIALYSIS  200 mcg Subcutaneous Q Sat-1800   fluticasone furoate-vilanterol  1 puff Inhalation Daily   heparin sodium (porcine)       lanthanum  2,000 mg Oral TID WC   metoprolol succinate  25 mg Oral BID   midodrine  10 mg Oral TID WC   predniSONE  40 mg Oral Q breakfast   torsemide  100 mg Oral Once per day on Sun Mon Wed Fri   Elmarie Shiley, MD 06/01/2022, 10:04 AM

## 2022-06-01 NOTE — Telephone Encounter (Signed)
Pharmacy Patient Advocate Encounter  Insurance verification completed.    The patient is insured through [employer]   The patient is currently admitted and ran test claims for the following: Breo Ellipta .  Copays and coinsurance results were relayed to Inpatient clinical team.

## 2022-06-01 NOTE — Progress Notes (Addendum)
Rounding Note    Patient Name: Phillip Young Date of Encounter: 06/01/2022  [place] Cardiologist: [name], MD   Subjective   Feels weak, poorly, still SOB  Inpatient Medications    Scheduled Meds:  (feeding supplement) PROSource Plus  30 mL Oral BID BM   apixaban  5 mg Oral BID   Chlorhexidine Gluconate Cloth  6 each Topical Daily   Chlorhexidine Gluconate Cloth  6 each Topical Q0600   darbepoetin (ARANESP) injection - DIALYSIS  200 mcg Subcutaneous Q Sat-1800   fluticasone furoate-vilanterol  1 puff Inhalation Daily   lanthanum  2,000 mg Oral TID WC   metoprolol succinate  25 mg Oral BID   midodrine  10 mg Oral TID WC   predniSONE  40 mg Oral Q breakfast   torsemide  100 mg Oral Once per day on Sun Mon Wed Fri   Continuous Infusions:  PRN Meds: acetaminophen **OR** acetaminophen, polyethylene glycol, senna-docusate   Vital Signs    Vitals:   05/31/22 1931 06/01/22 0752 06/01/22 0825 06/01/22 0900  BP: 1_0 108/78  Pulse: (!) 105 (!) 108 (!) 111 (!) 112  Resp: 18 17 (!) 30 (!) 28  Temp:  98.8 F (37.1 C)    TempSrc:      SpO2:  100% 100% 99%  Weight:  (!) 184.2 kg    Height:        Intake/Output Summary (Last 24 hours) at 06/01/2022 2836 Last data filed at 05/31/2022 1415 Gross per 24 hour  Intake --  Output 500 ml  Net -500 ml      06/01/2022    7:52 AM 05/29/2022    7:51 AM 05/28/2022    6:28 AM  Last 3 Weights  Weight (lbs) 406 lb 1.4 oz 403 lb 3.5 oz 399 lb 4.1 oz  Weight (kg) 184.2 kg 182.9 kg 181.1 kg      Telemetry    AFlutter 100's-130's, mostly 120's, though this AM in HD looks more like AFib   - Personally Reviewed  ECG    No new EKGs  - Personally Reviewed  Physical Exam   GEN: No acute distress.   Neck: No JVD Cardiac: irreg-irreg, tachycardic, no murmurs, rubs, or gallops.  Respiratory: diminished at the bases, otherwise clear b/l. GI: Soft, nontender, non-distended  MS: No edema; No  deformity. Neuro:  Nonfocal  Psych: Normal affect   Labs    High Sensitivity Troponin:   Recent Labs  Lab 05/22/22 0732 05/22/22 0937  TROPONINIHS 36* 38*     Chemistry Recent Labs  Lab 05/28/22 0649 05/28/22 1336 05/29/22 0343 05/30/22 0352 06/01/22 0426  NA  --    < > 135 133* 136  K  --    < > 4.8 4.1 3.4*  CL  --    < > 92* 95* 97*  CO2  --   --  19* 21* 20*  GLUCOSE  --    < > 100* 113* 113*  BUN  --    < > 54* 53* 73*  CREATININE  --    < > 9.58* 8.68* 11.73*  CALCIUM  --   --  9.4 9.1 8.5*  PROT 8.3*  --   --   --   --   ALBUMIN 3.4*  --  3.2* 3.0*  --   AST 21  --   --   --   --   ALT 27  --   --   --   --  ALKPHOS 101  --   --   --   --   BILITOT 1.1  --   --   --   --   GFRNONAA  --   --  7* 7* 5*  ANIONGAP  --   --  24* 17* 19*   < > = values in this interval not displayed.    Lipids No results for input(s): "CHOL", "TRIG", "HDL", "LABVLDL", "LDLCALC", "CHOLHDL" in the last 168 hours.  Hematology Recent Labs  Lab 05/30/22 0352 05/31/22 0414 06/01/22 0426  WBC 13.5* 12.2* 11.2*  RBC 2.31* 2.28* 2.22*  HGB 8.7* 8.8* 8.2*  HCT 24.1* 23.6* 23.3*  MCV 104.3* 103.5* 105.0*  MCH 37.7* 38.6* 36.9*  MCHC 36.1* 37.3* 35.2  RDW 14.6 14.3 14.6  PLT 285 266 310   Thyroid  Recent Labs  Lab 05/27/22 1448  TSH 1.750  FREET4 0.82    BNPNo results for input(s): "BNP", "PROBNP" in the last 168 hours.  DDimer No results for input(s): "DDIMER" in the last 168 hours.   Radiology    CT CHEST WO CONTRAST  Result Date: 05/31/2022 CLINICAL DATA:  Airspace opacities in the lungs EXAM: CT CHEST WITHOUT CONTRAST TECHNIQUE: Multidetector CT imaging of the chest was performed following the Young protocol without IV contrast. RADIATION DOSE REDUCTION: This exam was performed according to the departmental dose-optimization program which includes automated exposure control, adjustment of the mA and/or kV according to patient size and/or use of iterative  reconstruction technique. COMPARISON:  05/26/2022 FINDINGS: Cardiovascular: Right IJ catheter tip: Right atrium. Moderate cardiomegaly. Minimal atherosclerotic calcification of the aortic arch. Mediastinum/Nodes: Roughly similar degree of thoracic adenopathy, AP window lymph node 1.9 cm and right paratracheal lymph node 1.7 cm on images 26 and 23 of series 3, respectively. Mildly prominent bilateral level IV lymph nodes in the neck are stable. Lungs/Pleura: Hazy ground-glass opacities in both lungs similar to previous. There is some minimal areas of suspected air trapping in both lungs. Similar regions of bilateral airspace opacities favoring the lower lobes, some areas mildly worsened and some improved from previous. There is some confluent reticulonodular opacities peripherally in the right lower lobe in a rib and scattered wedge-shaped airspace opacities bilaterally mostly fairly similar to prior. Upper Abdomen: Unremarkable Musculoskeletal: Unremarkable IMPRESSION: 1. Similar degree of hazy ground-glass opacities in both lungs with some minimal areas of suspected air trapping in both lungs. 2. Similar regions of scattered bilateral airspace opacities favoring the lower lobes, some areas mildly worsened and some improved from previous. 3. Roughly similar degree of thoracic adenopathy. 4. Moderate cardiomegaly. 5. Minimal atherosclerotic calcification of the aortic arch. Electronically Signed   By: Van Clines M.D.   On: 05/31/2022 15:47    Cardiac Studies   03/24/22: TTE IMPRESSIONS  1. Left ventricular ejection fraction, by estimation, is 20 to 25%. The left ventricle has severely decreased function. The left ventricle demonstrates global hypokinesis. The left ventricular internal cavity size was moderately dilated. Left ventricular diastolic parameters were normal.  2. Right ventricular systolic function is normal. The right ventricular size is mildly enlarged. There is severely elevated  pulmonary artery systolic pressure. The estimated right ventricular systolic pressure is 12.7 mmHg.  3. Left atrial size was mildly dilated.  4. Right atrial size was mildly dilated.  5. The mitral valve is normal in structure. Mild to moderate mitral valve regurgitation. No evidence of mitral stenosis.  6. The aortic valve is normal in structure. Aortic valve regurgitation is not visualized.  No aortic stenosis is present.  7. The inferior vena cava is normal in size with greater than 50% respiratory variability, suggesting right atrial pressure of 3 mmHg.  Comparison(s): No significant change from prior study. Prior images reviewed side by side.   03/04/2020: R/LHC 1. No significant CAD, nonischemic cardiomyopathy.  2. Filling pressures remain elevated.  3. Severe pulmonary venous hypertension.  4. Low but not markedly low cardiac output.    Patient Profile     36 y.o. male w/PMHx of ESRF on HD, NICM, chronic CHF (biventricular failure), AFlutter, HTN (?), HLD, super morbid obesity, OSA  Admitted with progressive SOB, he was found hypotensive and in rapid Aflutter and urgently CV.  ESR was elevated and CT showed new bilateral mass like consolidations and cluster areas of nodularity.  Findings favored to be multifocal PNA, though inflammatory etiology such as amiodarone drug toxicity also a possibility and his amiodarone stopped  Started on ABx with WBC 16k, brown/rust colored sputum. Weaned off BiPap as tolerated.    He maintained NSR s/p [place] until 11/2.   EP brought on board, felt to high risk for AFlutter ablation and very high likelyhood of developing AFib  DCCV 05/28/22 and metoprolol titrated.  Recurrent Aflutter with RVR 05/31/22 morning  Assessment & Plan    AFlutter  CHA2DS2Vasc is one, on Eliquis Fast and poorly tolerated Failed amiodarone historically with some elevated LFTs and now with suspect pulm toxicity No other AAD option EKG and telemetry today appear more  like Afib unfortunately  He is NPO for possible ablation, though worried he has developed AFib Dr. Curt Bears Jaquelynn Wanamaker see him and review telemetry/EKGs for final EP recommendations  Discussed rational for amiodarone use, he is frustrated about the decision and potential lung toxicity and wished he would have been considered for ablation instead. Discussed that ablation carries real and significant potential procedural complications and that the potential of medication issues (recurrent) hepatic or development of pulmonary , thyroid were felt to be less then those of procedural complications I also mentioned that discussion to proceed with ablation remains undecided, particularly in light of what looks like AFib today, which he had high likely hood of developing.   Acute/chronic CHF NICM Biventricular failure Acute hypoxic respiratory failure Combination of volume overload and multifocal PNA.   High resolution CT chest showed mass-like consolidations and clustered areas of nodularity bilaterally, multifocal PNA or drug toxicity. Unclear if lung opacifications are infectious or inflammatory  CCM is on board  ceftriaxone + doxycyline  Prednisone started today  5.  ESRF/HD Volume with HD  For questions or updates, please contact [place] Please consult [email or web address] for contact info under        Signed, Baldwin [place], PA-C  06/01/2022, 9:38 AM    I have seen and examined this patient with Phillip Young.  Agree with above, note added to reflect my findings.  Patient feeling weak, fatigued, dizzy.  He is dizzy when he stands.  Unfortunately he has gone back into atrial fibrillation.  He was previously in atrial flutter.  He did have cardioversion, but converted yesterday to atrial fibrillation.  GEN: Well nourished, well developed, in no acute distress  HEENT: normal  Neck: no JVD, carotid bruits, or masses Cardiac: RRR; no murmurs, rubs, or gallops,no edema  Respiratory:   clear to auscultation bilaterally, normal work of breathing GI: soft, nontender, nondistended, + BS MS: no deformity or atrophy  Skin: warm and dry Neuro:  Strength and sensation are  intact Psych: euthymic mood, full affect   Persistent atrial fibrillation/atrial flutter: Has had what appears to be multiple atrial flutter circuits both typical and atypical.  Initially it was thought that he would benefit from ablation, but has he has gone into atrial fibrillation, ablation would be of minimal benefit.  He is currently on dialysis, and with his chronic systolic heart failure and super morbid obesity, would not be a good candidate.  Unfortunately as he is on dialysis, he would not be able to tolerate antiarrhythmics other than amiodarone, but he has developed toxicities from amiodarone.  We Aldin Drees plan for a rate control strategy.  He would be a poor candidate for pacemaker implant and AV node ablation due to his dialysis dependency.  Chessica Audia M. Tayla Panozzo MD 06/01/2022 4:21 PM

## 2022-06-01 NOTE — Telephone Encounter (Signed)
Scheduled for HFU with [facility] on 12/12. This should be printed on discharge papers and a reminder has been mailed.

## 2022-06-01 NOTE — Progress Notes (Signed)
Patient still on phone, but states he puts the BIPAP on and off by himself.

## 2022-06-01 NOTE — TOC Benefit Eligibility Note (Signed)
Patient Teacher, English as a foreign language completed.    The patient is currently admitted and upon discharge could be taking Breo Ellipta 100-25 mcg/act.  The current 30 day co-pay is $0.00.   The patient is insured through [place], [place] Patient [place] Patient Advocate Team Direct Number: [phone]  Fax: [phone]

## 2022-06-01 NOTE — Procedures (Signed)
HD Note:  Some information was entered later than the data was gathered due to patient care needs. The stated time with the data is accurate.Received patient in bed to unit.  Alert and oriented.  Informed consent signed and in chart.   Patient arrived on the unit alert and oriented.  He was in A-fib cardiac rhythm. He was upset about his medical care. Treatment started without issue. Physicians arrived with rounds and the patient became very angry, although was not acting violently or overly loud.  His heart rate rose to 159 b/m.  It stayed in the 130's and would spike back into the 150 range frequently.  Consulted with Dr. Raymondo Band who was with the rounding group and discontinued treatment.   An EKG was ordered and patient refused to have the assessment done while in the [place]. Transported back to the room  Alert, without acute distress.  Hand-off given to patient's nurse.   Access used: HD catheter used without any issues  Total UF removed: [facility]

## 2022-06-01 NOTE — Progress Notes (Signed)
NAME:  Phillip Young, MRN:  [id number], DOB:  05-13-1986, LOS: 9 ADMISSION DATE:  05/22/2022, CONSULTATION DATE:  05/22/22 REFERRING MD:  Tyrone Nine, CHIEF COMPLAINT:  SOB   History of Present Illness:  36 year old man well known to our service from prior admission who presents with worsening SOB.  ESRD, reduced EF, morbid obesity, OHS presenting with weight gain, orthopnea, PND, palpitations found to be in afib with hypotension.  Cardioverted and Bps have recovered okay.   He is still dyspneic.   Some concern regarding his BP so PCCM consulted to consider admission  Pertinent  Medical History  Biventricular heart failure ESRD OSA Class V obesity GERD HTN DM2  Significant Hospital Events: Including procedures, antibiotic start and stop dates in addition to other pertinent events   10/28 admit, cardioversion  Interim History / Subjective:  Continues to be short of breath requiring BiPAP Objective   Blood pressure 108/78, pulse (!) 112, temperature 98.8 F (37.1 C), resp. rate (!) 28, height 6' (1.829 m), weight (!) 184.2 kg, SpO2 99 %.        Intake/Output Summary (Last 24 hours) at 06/01/2022 0926 Last data filed at 05/31/2022 1415 Gross per 24 hour  Intake --  Output 500 ml  Net -500 ml   Filed Weights   05/28/22 0628 05/29/22 0751 06/01/22 0752  Weight: (!) 181.1 kg (!) 182.9 kg (!) 184.2 kg    Examination: General: Morbidly obese male currently in hemodialysis and is on BiPAP for respiratory distress HEENT: MM pink/moist Neuro: Grossly intact without focal defect CV: Heart sounds are regular PULM: Decreased breath sounds Currently on BiPAP  GI: soft, bsx4 active morbidly obese Extremities: warm/dry,  edema  Skin: no rashes or lesions   Resolved Hospital Problem list   N/A  Assessment & Plan:  Acute hypoxemic respiratory failure Acute decompensated systolic heart failure due to NICM, worse due to Aflutter ESRD with fluid overload Symptomatic Aflutter w/ HD  compromise s/p DCCV 05/22/22 OSA on CPAP 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 Sed rate CRP remain elevated therefore will  started on 40 mg of prednisone daily dose will be adjusted when he follows up with Dr. Carlis Abbott as an outpatient Repeat CT of the chest in the future Would not rechallenge with amiodarone Continue Breo Cough suppressant as needed Continue noninvasive as needed     Best Practice (right click and "Reselect all SmartList Selections" daily)   Per primary  Labs   CBC: Recent Labs  Lab 05/28/22 0251 05/28/22 1336 05/29/22 0343 05/30/22 0352 05/31/22 0414 06/01/22 0426  WBC 14.4*  --  13.7* 13.5* 12.2* 11.2*  HGB 8.4* 8.5* 8.8* 8.7* 8.8* 8.2*  HCT 24.9* 25.0* 26.1* 24.1* 23.6* 23.3*  MCV 102.9*  --  104.0* 104.3* 103.5* 105.0*  PLT 245  --  293 285 266 790    Basic Metabolic Panel: Recent Labs  Lab 05/26/22 0419 05/27/22 0334 05/28/22 1336 05/29/22 0343 05/30/22 0352 06/01/22 0426  NA 134* 134* 133* 135 133* 136  K 4.1 4.3 4.1 4.8 4.1 3.4*  CL 95* 93* 94* 92* 95* 97*  CO2 25 22  --  19* 21* 20*  GLUCOSE 112* 109* 108* 100* 113* 113*  BUN 48* 62* 40* 54* 53* 73*  CREATININE 9.37* 10.86* 8.70* 9.58* 8.68* 11.73*  CALCIUM 8.9 9.2  --  9.4 9.1 8.5*  PHOS 5.2* 4.8*  --  5.4* 4.6  --    GFR: Estimated Creatinine Clearance: 14.7 mL/min (A) (by C-G  formula based on SCr of 11.73 mg/dL (H)). Recent Labs  Lab 05/29/22 0343 05/30/22 0352 05/31/22 0414 06/01/22 0426  WBC 13.7* 13.5* 12.2* 11.2Richardson Landry Lyndzie Zentz ACNP Acute Care Nurse Practitioner [place] Please consult [place] 06/01/2022, 9:26 AM

## 2022-06-01 NOTE — Progress Notes (Signed)
Subjective:  Patient reports feeling okay this morning while receiving hemodialysis. He expressed a general frustration with the decision to prescribe amiodarone despite its pulmonary risks. Hemodialysis session was discontinued prematurely due to elevated heart rate during this conversation. Denies negative feelings toward members of current treatment team. Discussed plan to introduce inhaler and steroid medications, which should help alleviate his respiratory symptoms. Patient expressed understanding of, agreement with, and appreciation for this plan.    Objective: Vitals over previous 24hr: Vitals:   06/01/22 0825 06/01/22 0900 06/01/22 0930 06/01/22 0949  BP: 96/80 108/78  (!) 110/48  Pulse: (!) 111 (!) 112 (!) 142 (!) 128  Resp: (!) 30 (!) 28  (!) 30  Temp:    98.9 F (37.2 C)  TempSrc:      SpO2: 100% 99%  100%  Weight:    (!) 183.3 kg  Height:        General:      awake and alert, sitting comfortably in bed, cooperative, not in acute distress Skin:       warm and dry, intact without any obvious lesions or scars, no rashes Head:      normocephalic and atraumatic, oral mucosa moist Lungs:      normal respiratory effort, breathing unlabored, symmetrical chest rise, slightly decreased sounds in lower fields but generally difficult to appreciate given body habitus, no crackles or wheezing Cardiac:      regular rate and rhythm, normal S1 and S2 Neurologic:      oriented to person-place-time, moving all extremities, no gross focal deficits Psychiatric:      mood and affect normal, intelligible speech    Assessment/Plan: Mr Ammon is a 36 year old male with a past medical history of morbid obesity, HFrEF, OSA, OHS, AFib-AFlutter who presented with breath shortness, lightheadedness, and palpitations, now admitted for chronic hypoxic respiratory failure and atrial fibrillation.    ---Acute on chronic hypoxic respiratory failure ---Community acquired  pneumonia ---Obstructive sleep apnea on nightly BiPAP ---Obesity hypoventilation syndrome Patient presented on 10-28 with breath shortness, found to have atrial flutter rapid ventricular response and hypotension. Of note patient started amiodarone for atrial fibrillation on 10-26, which was subsequently discontinued upon arrival. Chest radiograph revealed evidence of pulmonary venous congestion and CT showed bilateral consolidations concerning for possible pneumonia. Five-day course of ceftriaxone and doxycycline were completed on 11-6, after which repeat imaging failed to demonstrate any improvement. Volume status has been managed with hemodialysis throughout hospitalization and oxygen requirements have decreased since admission. Symptom etiology remains unclear, though given lack of improvement with antibiotic therapy, amiodarone toxicity is strong possibility. Course of prednisone was started on 11-7 to address the associated inflammatory component. > Fluticasone-vilanterol 100-25ug q24 > Prednisone 40mg  q24 > Administer BiPAP nightly > Wean oxygen requirements as tolerated > Volume management with hemodialysis > Trend CBC q24   ---End-stage renal disease on hemodialysis Patient missed hemodialysis session on 10-28 prior to ED arrival. Estimated dry weight around 175-180kg, currently at 183kg. Volume overload likely contributing to respiratory failure, managed with hemodialysis per his usual Tu-Th-Sa schedule. Hemodialysis today 11-7 was discontinued prematurely due to elevated heart rate, planning another session tomorrow 11-8. > Nephrology consult, appreciate recommendations > Hemodialysis on Tu-Th-Sa plus 11-8 > Torsemide 100mg  on Mo-We-Fr-Su > Trend BMP q24   ---Heart failure with reduced ejection fraction ---Hypotension Echocardiogram performed 02-2022 demonstrated ejection fraction of 20-25% with history negative for an ischemic event. Patient presented with lightheadedness and breath  shortness. Upon arrival, troponin levels were slightly elevated  and BNP at baseline. Management of heart failure is limited due to ESRD, hypotension requiring midodrine, and morbid obesity status. An acute heart failure exacerbation is potentially contributing to patient's current presentation, volume status managed with hemodialysis. > Cardiology consult, appreciate recommendations > Midodrine 10mg  q8 > Metoprolol 25mg  q12 > Volume management with hemodialysis > Torsemide 100mg  on Mo-We-Fr-Su   ---Atrial flutter post cardioversion x2 Patient presented on 10-28 with symptomatic tachyarrhythmia and hypotension, found to have atrial flutter plus rapid ventricular response. Atrial flutter initially resolved after cardioversion, but episodes have since returned despite a second attempt. Internal medicine team is currently holding amiodarone given possibility of pulmonary toxicity. Since treatment options are limited, high-risk ablation may represent the best path forward. Cardiology and electrophysiology teams are considering ablation, which would have to wait until atrial fibrillation episode resolves. > Cardiology consult, appreciate recommendations > Diet NPO for possible ablation > Apixaban 5mg  q24   ---Iron deficiency anemia Laboratory testing in 03-2022 consistent with iron deficiency anemia. Upon arrival, hemoglobin was 8.8 and has been stable since admission. > Consider ferrous sulfate supplement upon discharge     Principal Problem:   Acute hypoxic respiratory failure ([name]) Active Problems:   OSA (obstructive sleep apnea)   Chronic systolic CHF (congestive heart failure) ([name])   Paroxysmal atrial flutter ([name])   Acute respiratory failure with hypoxia ([name])   ESRD (end stage renal disease) on dialysis ([name])   Hypotension   Multiple nodules of lung    Prior to Admission Living Arrangement: home Anticipated Discharge Location: home Barriers to Discharge: medical  management Dispo: Anticipated discharge in approximately 2-3 day(s).    [place] Nickel, MD Internal Medicine PGY-1 Pager [phone]  After 5pm on weekdays and 1pm on weekends: On Call pager [phone]

## 2022-06-02 DIAGNOSIS — J9601 Acute respiratory failure with hypoxia: Secondary | ICD-10-CM | POA: Diagnosis not present

## 2022-06-02 LAB — BASIC METABOLIC PANEL
Anion gap: 18 — ABNORMAL HIGH (ref 5–15)
BUN: 67 mg/dL — ABNORMAL HIGH (ref 6–20)
CO2: 19 mmol/L — ABNORMAL LOW (ref 22–32)
Calcium: 8.5 mg/dL — ABNORMAL LOW (ref 8.9–10.3)
Chloride: 98 mmol/L (ref 98–111)
Creatinine, Ser: 11.36 mg/dL — ABNORMAL HIGH (ref 0.61–1.24)
GFR, Estimated: 5 mL/min — ABNORMAL LOW (ref >60)
Glucose, Bld: 104 mg/dL — ABNORMAL HIGH (ref 70–99)
Potassium: 3.7 mmol/L (ref 3.5–5.1)
Sodium: 135 mmol/L (ref 135–145)

## 2022-06-02 LAB — CBC
HCT: 21.8 % — ABNORMAL LOW (ref 39.0–52.0)
Hemoglobin: 8 g/dL — ABNORMAL LOW (ref 13.0–17.0)
MCH: 37.7 pg — ABNORMAL HIGH (ref 26.0–34.0)
MCHC: 36.7 g/dL — ABNORMAL HIGH (ref 30.0–36.0)
MCV: 102.8 fL — ABNORMAL HIGH (ref 80.0–100.0)
Platelets: 328 10*3/uL (ref 150–400)
RBC: 2.12 MIL/uL — ABNORMAL LOW (ref 4.22–5.81)
RDW: 14.7 % (ref 11.5–15.5)
WBC: 13 10*3/uL — ABNORMAL HIGH (ref 4.0–10.5)
nRBC: 0 % (ref 0.0–0.2)

## 2022-06-02 MED ORDER — MIDODRINE HCL 5 MG PO TABS
15.0000 mg | ORAL_TABLET | Freq: Three times a day (TID) | ORAL | Status: DC
  Administered 2022-06-03 – 2022-06-04 (×3): 15 mg via ORAL
  Filled 2022-06-02 (×4): qty 3

## 2022-06-02 MED ORDER — CHLORHEXIDINE GLUCONATE CLOTH 2 % EX PADS
6.0000 | MEDICATED_PAD | Freq: Every day | CUTANEOUS | Status: DC
  Administered 2022-06-02: 6 via TOPICAL

## 2022-06-02 MED ORDER — METOPROLOL SUCCINATE ER 25 MG PO TB24
37.5000 mg | ORAL_TABLET | Freq: Two times a day (BID) | ORAL | Status: DC
  Administered 2022-06-02 – 2022-06-03 (×2): 37.5 mg via ORAL
  Administered 2022-06-03: 12.5 mg via ORAL
  Filled 2022-06-02 (×4): qty 2

## 2022-06-02 MED ORDER — CHLORHEXIDINE GLUCONATE CLOTH 2 % EX PADS
6.0000 | MEDICATED_PAD | Freq: Every day | CUTANEOUS | Status: DC
  Administered 2022-06-02 – 2022-06-10 (×5): 6 via TOPICAL

## 2022-06-02 NOTE — Progress Notes (Signed)
Patient ID: Phillip Young, male   DOB: 1985/09/11, 36 y.o.   MRN: [id number] P    Advanced Heart Failure Rounding Note  PCP-Cardiologist: [name], MD   Subjective:    Admitted w/ AFL w/ RVR + acute hypoxic respiratory failure due to PNA vs amiodarone lung toxicity.   10/28: emergent DCCV in ED>>NSR 11/2: reverted back to AFL w/ RVR,. PO amio discontinued. Started metoprolol 12.70m q6h. Abx started for PNA 11/3: DCCV to NSR 11/6: Back in AFL. Completed PNA coverage with ceftriaxone/doxycycline. Repeat ESR higher 113>>135  11/7: Started prednisone for possible amiodarone toxicity  Short of breath with any walking. HR around 120 this morning, now back in typical atrial flutter.   CT chest: mass-like consolidations and clustered areas of nodularity bilaterally, multifocal PNA or drug toxicity.   Objective:   Weight Range: (!) 183.3 kg Body mass index is 54.81 kg/m.   Vital Signs:   Temp:  [98.1 F (36.7 C)-98.7 F (37.1 C)] 98.7 F (37.1 C) (11/08 0756) Pulse Rate:  [120-133] 129 (11/08 0756) Resp:  [18-20] 19 (11/08 0756) BP: (100-115)/(70-84) 115/80 (11/08 0756) SpO2:  [98 %] 98 % (11/08 0756) Last BM Date : 05/27/22  Weight change: Filed Weights   05/29/22 0751 06/01/22 0752 06/01/22 0949  Weight: (!) 182.9 kg (!) 184.2 kg (!) 183.3 kg    Intake/Output:   Intake/Output Summary (Last 24 hours) at 06/02/2022 1135 Last data filed at 06/02/2022 0400 Gross per 24 hour  Intake 700 ml  Output 700 ml  Net 0 ml      Physical Exam    General: Obese, NAD Neck: Thick, JVP difficult, no thyromegaly or thyroid nodule.  Lungs: Clear to auscultation bilaterally with normal respiratory effort. CV: Nondisplaced PMI.  Heart tachy, regular S1/S2, no S3/S4, no murmur.  No peripheral edema.   Abdomen: Soft, nontender, no hepatosplenomegaly, no distention.  Skin: Intact without lesions or rashes.  Neurologic: Alert and oriented x 3.  Psych: Normal affect. Extremities: No  clubbing or cyanosis.  HEENT: Normal.   Telemetry   Atrial flutter around 120 (personally reviewed)  EKG    No new EKG to review   Labs    CBC Recent Labs    06/01/22 0426 06/02/22 0633  WBC 11.2* 13.0*  HGB 8.2* 8.0*  HCT 23.3* 21.8*  MCV 105.0* 102.8*  PLT 310 3614  Basic Metabolic Panel Recent Labs    06/01/22 0426 06/02/22 0633  NA 136 135  K 3.4* 3.7  CL 97* 98  CO2 20* 19*  GLUCOSE 113* 104*  BUN 73* 67*  CREATININE 11.73* 11.36*  CALCIUM 8.5* 8.5*   Liver Function Tests No results for input(s): "AST", "ALT", "ALKPHOS", "BILITOT", "PROT", "ALBUMIN" in the last 72 hours.  No results for input(s): "LIPASE", "AMYLASE" in the last 72 hours. Cardiac Enzymes No results for input(s): "CKTOTAL", "CKMB", "CKMBINDEX", "TROPONINI" in the last 72 hours.  BNP: BNP (last 3 results) Recent Labs    02/18/22 0154 03/26/22 1958 05/22/22 0732  BNP 373.8* 374.2* 373.1*    ProBNP (last 3 results) No results for input(s): "PROBNP" in the last 8760 hours.   D-Dimer No results for input(s): "DDIMER" in the last 72 hours. Hemoglobin A1C No results for input(s): "HGBA1C" in the last 72 hours. Fasting Lipid Panel No results for input(s): "CHOL", "HDL", "LDLCALC", "TRIG", "CHOLHDL", "LDLDIRECT" in the last 72 hours. Thyroid Function Tests No results for input(s): "TSH", "T4TOTAL", "T3FREE", "THYROIDAB" in the last 72 hours.  Invalid input(s): "  FREET3"   Other results:   Imaging    No results found.   Medications:     Scheduled Medications:  (feeding supplement) PROSource Plus  30 mL Oral BID BM   apixaban  5 mg Oral BID   Chlorhexidine Gluconate Cloth  6 each Topical Daily   Chlorhexidine Gluconate Cloth  6 each Topical Q0600   Chlorhexidine Gluconate Cloth  6 each Topical Q0600   Chlorhexidine Gluconate Cloth  6 each Topical Q0600   darbepoetin (ARANESP) injection - DIALYSIS  200 mcg Subcutaneous Q Sat-1800   fluticasone furoate-vilanterol  1 puff  Inhalation Daily   lanthanum  2,000 mg Oral TID WC   metoprolol succinate  37.5 mg Oral BID   midodrine  10 mg Oral TID WC   predniSONE  40 mg Oral Q breakfast   torsemide  100 mg Oral Once per day on Sun Mon Wed Fri    Infusions:    PRN Medications: acetaminophen **OR** acetaminophen, polyethylene glycol, senna-docusate    Patient Profile   36 y/o male w/ chronic biventricular heart failure due to NICM, PAF/AFL s/p multiple DCCVs, multiple intolerances to amiodarone, ESRD on iHD, OSA on CPAP and morbid obesity, admitted for acute hypoxic respiratory failure and hypotension in the setting of recurrent ALF w/ RVR, requiring emergent DCCV in ED. Now w/ concern for potential amiodarone pulmonary toxicity. Also being treated for possible PNA.   Assessment/Plan   1. Atrial flutter/fibrillation: History of multiple DCCVs. Amiodarone previously stopped due to elevated LFTs. Restarted 05/20/22 by Dr. Curt Bears.  Admitted w/ hemodynamically unstable AFL w/ RVR requiring emergent DCCV>>NSR in ER.  Atrial flutter recurred with RVR post-DCCV.  Now w/ concern for possible amiodarone pulmonary toxicity w/ elevated ESR 113 and abnormal high resolution chest CT showing mass-like consolidations and clustered areas of nodularity bilaterally, multifocal PNA or drug toxicity. Amiodarone stopped (he says breathing got worse after restarting on amiodarone).  DCCV twice this admission but fibrillation and flutter have recurred.  I reviewed ECGs with EP, he has been in BOTH atrial flutter (typical and atypical) and fibrillation this admission.  HR 120s, typical flutter today.  - Per Dr. Curt Bears, he is not a candidate for atrial flutter or fibrillation ablation.  Has multiple flutter circuits and also has had fibrillation this admission.  Would not ablate flutter alone without treating atrial fibrillation, and he would not be a candidate for atrial fibrillation ablation.  - We are struggling to rate control him,  increase Toprol XL to 37.5 mg bid and midodrine to 15 mg tid.  Has some LH with standing.  - Poor candidate for AV nodal ablation with BiV pacing due to ESRD and infection risk, ?revisit if we can't rate control  him.  - Not candidate for amiodarone or Tikosyn, no anti-arrhythmic option.  Without anti-arrhythmic, no benefit to be derived from cardioverting him again.  - Continue Eliquis 5 mg bid  2.  Chronic systolic CHF: Nonischemic CMP, LHC 2021 with no CAD. Previous hospitalizations w/shock in setting of rapid AFib/Flutter requiring milrinone. Most recent echo 8/23 with EF 20-25%, RV mildly reduced. ESRD, volume controlled by HD.   - GDMT limited by CKD and hypotension requiring midodrine  - Not candidate for ICD given obesity and dialysis  - Not candidate for advanced therapies.  3. Acute Hypoxic Respiratory Failure: Combination of volume overload and multifocal PNA.  High resolution CT chest showed mass-like consolidations and clustered areas of nodularity bilaterally, multifocal PNA or drug toxicity. Unclear if lung  opacifications are infectious or inflammatory. Repeat ESR higher 113>>135. Suspect amiodarone lung toxicity, pulmonary following.  He has completed a course of abx with no improvement in CT chest or inflammatory markers.  - Amiodarone has been stopped - Prednisone started, will followup as outpatient with pulmonary.   4. ESRD: HD per nephrology  - on T,Th,Sat schedule  5. Obesity: Body mass index is 55.64 kg/m.  - wt loss advised 6. Fe deficiency anemia.  - T-sat < 20%. Received Ferrlecit per primary team  7. OSA: Continue CPAP QHS  Difficult situation as outlined above with very limited options.   Length of Stay: [place] 06/02/2022 11:53 AM  Advanced Heart Failure Team Pager [phone] (M-F; 7a - 5p)  Please contact [facility] for night-coverage after hours (5p -7a ) and weekends on [email or web address]

## 2022-06-02 NOTE — Progress Notes (Signed)
Subjective:  Patient reports feeling pretty good this morning, but expressed mistrust with his heart failure team given amiodarone toxicity. Emphasized that treatment options are limited in the setting of his multiple conditions. Discussed possibility of transferring to [facility] and patient declined, stating that he likes his primary team. He is interested in attempting another cardioversion procedure.   Objective: Vitals over previous 24hr: Vitals:   06/01/22 1937 06/02/22 0400 06/02/22 0700 06/02/22 0756  BP: 113/84 100/70  115/80  Pulse:  (!) 133 (!) 120 (!) 129  Resp: 20  18 19   Temp: 98.1 F (36.7 C) 98.5 F (36.9 C)  98.7 F (37.1 C)  TempSrc:  Oral  Oral  SpO2:  98%  98%  Weight:      Height:        General:      awake and alert, sitting comfortably in bed, cooperative, not in acute distress Skin:       warm and dry, intact without any obvious lesions or scars, no rashes Head:      normocephalic and atraumatic, oral mucosa moist Lungs:      increased respiratory effort, breathing slightly labored, symmetrical chest rise, slightly decreased sounds in lower fields but generally difficult to appreciate given body habitus, no crackles or wheezing Cardiac:      tachycardic with irregular rate, normal S1 and S2 Neurologic:      oriented to person-place-time, moving all extremities, no gross focal deficits Psychiatric:      mood and affect normal, intelligible speech    Assessment/Plan: Phillip Young is a 36 year old male with a past medical history of morbid obesity, HFrEF, OSA, OHS, AFib-AFlutter who presented with breath shortness, lightheadedness, and palpitations, now admitted for chronic hypoxic respiratory failure and atrial fibrillation.    ---Acute on chronic hypoxic respiratory failure ---Community acquired pneumonia ---Obstructive sleep apnea on nightly BiPAP ---Obesity hypoventilation syndrome Patient presented on 10-28 with breath shortness, found to have  atrial flutter rapid ventricular response and hypotension. Of note patient started amiodarone for atrial fibrillation on 10-26, which was subsequently discontinued upon arrival. Chest radiograph revealed evidence of pulmonary venous congestion and CT showed bilateral consolidations concerning for possible pneumonia. Five-day course of ceftriaxone and doxycycline were completed on 11-6, after which repeat imaging failed to demonstrate any improvement. Volume status has been managed with hemodialysis throughout hospitalization and oxygen requirements have decreased since admission. Symptom etiology remains unclear, though given lack of improvement with antibiotic therapy, amiodarone toxicity is strong possibility. Course of prednisone was started on 11-7 to address the suspected inflammatory component. > Fluticasone-vilanterol 100-25ug q24 > Prednisone 40mg  q24 > Administer BiPAP nightly > Wean oxygen requirements as tolerated, currently 7.5L [place] > Volume management with hemodialysis > Trend CBC q24   ---End-stage renal disease on hemodialysis Patient missed hemodialysis session on 10-28 prior to ED arrival. Estimated dry weight around 175-180kg, currently at 183kg. Volume overload likely contributing to respiratory failure, managed with hemodialysis per his usual Tu-Th-Sa schedule. Hemodialysis yesterday 11-7 was discontinued prematurely due to elevated heart rate, next session scheduled for 11-9. > Nephrology consult, appreciate recommendations > Hemodialysis on Tu-Th-Sa > Torsemide 100mg  on Mo-We-Fr-Su > Trend BMP q24   ---Heart failure with reduced ejection fraction ---Hypotension Echocardiogram performed 02-2022 demonstrated ejection fraction of 20-25% with history negative for an ischemic event. Patient presented with lightheadedness and breath shortness. Upon arrival, troponin levels were slightly elevated and BNP at baseline. Management of heart failure is limited due to ESRD, hypotension  requiring midodrine, and  morbid obesity status. An acute heart failure exacerbation is potentially contributing to patient's symptoms and his volume status currently managed with hemodialysis. > Cardiology consult, appreciate recommendations > Midodrine 15mg  q8 > Metoprolol 37.5mg  q12 > Volume management with hemodialysis on Tu-Th-Sa > Torsemide 100mg  on Mo-We-Fr-Su   ---Atrial flutter post two cardioversions Patient presented on 10-28 with symptomatic tachyarrhythmia and hypotension, found to have atrial flutter plus rapid ventricular response. Atrial flutter initially resolved after cardioversion, but episodes have since returned despite a second attempt. Internal medicine team is holding amiodarone given likely pulmonary toxicity. Treatment options for his atrial fibrillation are very limited. Cardiology and electrophysiology teams are considering options, possibly high-risk ablation followed by pacemaker. > Cardiology consult, appreciate recommendations > Apixaban 5mg  q24   ---Iron deficiency anemia Laboratory testing in 03-2022 consistent with iron deficiency anemia. Upon arrival, hemoglobin was 8.8 and has been stable since admission. > Consider ferrous sulfate supplementation upon discharge     Principal Problem:   Acute hypoxic respiratory failure ([name]) Active Problems:   OSA (obstructive sleep apnea)   Chronic systolic CHF (congestive heart failure) ([name])   Paroxysmal atrial flutter ([name])   Acute respiratory failure with hypoxia ([name])   ESRD (end stage renal disease) on dialysis ([name])   Hypotension   Multiple nodules of lung   Elevated sed rate (elev SR)   Elevated C-reactive protein   Acute on chronic respiratory failure with hypoxia ([place])    Prior to Admission Living Arrangement: home Anticipated Discharge Location: home Barriers to Discharge: medical management Dispo: Anticipated discharge in approximately 2-3 day(s).    Phillip Nickel, MD Internal Medicine  PGY-1 Pager [phone]  After 5pm on weekdays and 1pm on weekends: On Call pager [phone]

## 2022-06-02 NOTE — Progress Notes (Signed)
Patient woke up SOB and took bipap off and went back on nasal cannula.  HR 130's and dyspneic.  Patient asking for a 3rd ginger ale this shift.  Reminded him of 1200 ml fluid restriction and encouraged only ice at this time. Patient refused.

## 2022-06-02 NOTE — Progress Notes (Signed)
[facility] Progress Note   Subjective:   Pt seen in room, feeling SOB today and did not sleep well last night. Very stressed and upset about his current situation. Asks if renal diet can be changed, says he can make good choices to control his potassium. Denies CP, nausea and dizziness.   Objective Vitals:   06/01/22 1937 06/02/22 0400 06/02/22 0700 06/02/22 0756  BP: 113/84 100/70  115/80  Pulse:  (!) 133 (!) 120 (!) 129  Resp: 20  18 19   Temp: 98.1 F (36.7 C) 98.5 F (36.9 C)  98.7 F (37.1 C)  TempSrc:  Oral  Oral  SpO2:  98%  98%  Weight:      Height:       Physical Exam General: Alert male in NAD Heart: Irregular rhythm, tachycardic, no murmur Lungs: Decreased breath sounds bilaterally, no wheezing or rales Abdomen: Soft, non-distended, +BS Extremities: No edema b/l lower extremities Dialysis Access:  R IJ [facility]  Additional Objective Labs: Basic Metabolic Panel: Recent Labs  Lab 05/27/22 0334 05/28/22 1336 05/29/22 0343 05/30/22 0352 06/01/22 0426 06/02/22 0633  NA 134*   < > 135 133* 136 135  K 4.3   < > 4.8 4.1 3.4* 3.7  CL 93*   < > 92* 95* 97* 98  CO2 22  --  19* 21* 20* 19*  GLUCOSE 109*   < > 100* 113* 113* 104*  BUN 62*   < > 54* 53* 73* 67*  CREATININE 10.86*   < > 9.58* 8.68* 11.73* 11.36*  CALCIUM 9.2  --  9.4 9.1 8.5* 8.5*  PHOS 4.8*  --  5.4* 4.6  --   --    < > = values in this interval not displayed.   Liver Function Tests: Recent Labs  Lab 05/28/22 0649 05/29/22 0343 05/30/22 0352  AST 21  --   --   ALT 27  --   --   ALKPHOS 101  --   --   BILITOT 1.1  --   --   PROT 8.3*  --   --   ALBUMIN 3.4* 3.2* 3.0*   No results for input(s): "LIPASE", "AMYLASE" in the last 168 hours. CBC: Recent Labs  Lab 05/29/22 0343 05/30/22 0352 05/31/22 0414 06/01/22 0426 06/02/22 0633  WBC 13.7* 13.5* 12.2* 11.2* 13.0*  HGB 8.8* 8.7* 8.8* 8.2* 8.0*  HCT 26.1* 24.1* 23.6* 23.3* 21.8*  MCV 104.0* 104.3* 103.5* 105.0* 102.8*   PLT 293 285 266 310 328   Blood Culture    Component Value Date/Time   SDES BLOOD LEFT HAND 03/26/2022 1958   SDES BLOOD RIGHT HAND 03/26/2022 1958   [place]  03/26/2022 1958    BOTTLES DRAWN AEROBIC ONLY Blood Culture adequate volume   SPECREQUEST  03/26/2022 1958    BOTTLES DRAWN AEROBIC ONLY Blood Culture adequate volume   CULT  03/26/2022 1958    NO GROWTH 5 DAYS Performed at [facility], [place] [address] [place], [place] [address]    CULT  03/26/2022 1958    NO GROWTH 5 DAYS Performed at [facility] [name], [place], [place] [address]    REPTSTATUS 03/31/2022 FINAL 03/26/2022 1958   REPTSTATUS 03/31/2022 FINAL 03/26/2022 1958    Cardiac Enzymes: No results for input(s): "CKTOTAL", "CKMB", "CKMBINDEX", "TROPONINI" in the last 168 hours. CBG: No results for input(s): "GLUCAP" in the last 168 hours. Iron Studies: No results for input(s): "IRON", "TIBC", "TRANSFERRIN", "FERRITIN" in the  last 72 hours. @lablastinr3 @ Studies/Results: CT CHEST WO CONTRAST  Result Date: 05/31/2022 CLINICAL DATA:  Airspace opacities in the lungs EXAM: CT CHEST WITHOUT CONTRAST TECHNIQUE: Multidetector CT imaging of the chest was performed following the standard protocol without IV contrast. RADIATION DOSE REDUCTION: This exam was performed according to the departmental dose-optimization program which includes automated exposure control, adjustment of the mA and/or kV according to patient size and/or use of iterative reconstruction technique. COMPARISON:  05/26/2022 FINDINGS: Cardiovascular: Right IJ catheter tip: Right atrium. Moderate cardiomegaly. Minimal atherosclerotic calcification of the aortic arch. Mediastinum/Nodes: Roughly similar degree of thoracic adenopathy, AP window lymph node 1.9 cm and right paratracheal lymph node 1.7 cm on images 26 and 23 of series 3, respectively. Mildly prominent bilateral level IV lymph nodes in the neck are stable. Lungs/Pleura: Hazy  ground-glass opacities in both lungs similar to previous. There is some minimal areas of suspected air trapping in both lungs. Similar regions of bilateral airspace opacities favoring the lower lobes, some areas mildly worsened and some improved from previous. There is some confluent reticulonodular opacities peripherally in the right lower lobe in a rib and scattered wedge-shaped airspace opacities bilaterally mostly fairly similar to prior. Upper Abdomen: Unremarkable Musculoskeletal: Unremarkable IMPRESSION: 1. Similar degree of hazy ground-glass opacities in both lungs with some minimal areas of suspected air trapping in both lungs. 2. Similar regions of scattered bilateral airspace opacities favoring the lower lobes, some areas mildly worsened and some improved from previous. 3. Roughly similar degree of thoracic adenopathy. 4. Moderate cardiomegaly. 5. Minimal atherosclerotic calcification of the aortic arch. Electronically Signed   By: Van Clines M.D.   On: 05/31/2022 15:47   Medications:   (feeding supplement) PROSource Plus  30 mL Oral BID BM   apixaban  5 mg Oral BID   Chlorhexidine Gluconate Cloth  6 each Topical Daily   Chlorhexidine Gluconate Cloth  6 each Topical Q0600   darbepoetin (ARANESP) injection - DIALYSIS  200 mcg Subcutaneous Q Sat-1800   fluticasone furoate-vilanterol  1 puff Inhalation Daily   lanthanum  2,000 mg Oral TID WC   metoprolol succinate  25 mg Oral BID   midodrine  10 mg Oral TID WC   predniSONE  40 mg Oral Q breakfast   torsemide  100 mg Oral Once per day on Sun Mon Wed Fri    Outpatient Dialysis Orders: [facility] TTS 4:15 425/A1.5x 180 kg 3K/2.5Ca TDC  - Heparin: None/HIT Ab POSITIVE - Mircera 120 q 4 wks (last 10/12) - Hectorol 6 TIW, Sensipar 30 TIW  Assessment/Plan: 1. Atrial flutter with RVR: S/P cardioversion on 10/28 and then again on 11/3 with discontinuation of amiodarone with CT chest concerning for amiodarone lung. Plans noted for ablation  likely this week 2. ESRD: On TTS dialysis schedule and was on hemodialysis yesterday but discontinued after he became more tachycardic after getting more information about his heart/lungs. Will re-attempt HD today.  3. Anemia:Low H/H without overt loss, will continue ESA.  4. CKD-MBD: Calcium level at goal with phosphorus level acceptable on lanthanum.  5. Nutrition: Very upset about renal diet, says he has had to order food from outside hospital. K+ has been controlled, can trial heart healthy diet but he agrees to go back on renal diet if K+/phos worsen. Discussed that he needs to continue fluid restrictions.  6. Hypotension:Blood pressure acceptable on midodrine with metoprolol for cardiac rate control.   Anice Paganini, PA-C 06/02/2022, 8:35 AM  [facility] Pager: [phone]

## 2022-06-02 NOTE — Discharge Instructions (Addendum)
[name],  It was a pleasure to take care of you while you were in the hospital. Your breath shortness was caused by several factors including pneumonia, fluid overload from heart failure, and toxicity from amiodarone. We gave you supplemental oxygen as needed and a course of steroids, which you will continue to take at home for six weeks until December 28th:  CONTINUE: Prednisone 40mg  daily for two weeks THEN: Prednisone 30mg  daily for two weeks FINALLY: Prednisone 20mg  daily for two weeks  We are also sending you home with some oxygen because your oxygen requirements have increased. Please take this oxygen as needed throughout the day so that you can avoid feeling short of breath especially when exerting yourself.  While you were here, your heart rhythm became irregular and this caused some liver damage. We controlled your heart rate with metoprolol and your blood pressure with midodrine. After three cardioversion procedures your heart rhythm returned to normal. Eventually, your heart may start to beat irregularly again. If this happens, please return to the emergency department. You may also want to consider visiting another hospital for a second opinion about additional treatment options including an ablation procedure.  Please continue to take the rest of your regular medications as prescribed. We are sending you home with a few new medications including metoprolol, midodrine, and ferrous sulfate. In addition, you will now need to receive hemodialysis four days each week on a Mo-Tu-Th-Sa schedule.  Finally, we have arranged a follow-up visit with our Internal Medicine Clinic for 11-20 and Pulmonology for 12-12. We will see you soon!     Information on my medicine - ELIQUIS (apixaban)  This medication education was reviewed with me or my healthcare representative as part of my discharge preparation.  Why was Eliquis prescribed for you? Eliquis was prescribed for you to reduce the  risk of a blood clot forming that can cause a stroke if you have a medical condition called atrial fibrillation (a type of irregular heartbeat).  What do You need to know about Eliquis ? Take your Eliquis TWICE DAILY - one tablet in the morning and one tablet in the evening with or without food. If you have difficulty swallowing the tablet whole please discuss with your pharmacist how to take the medication safely.  Take Eliquis exactly as prescribed by your doctor and DO NOT stop taking Eliquis without talking to the doctor who prescribed the medication.  Stopping may increase your risk of developing a stroke.  Refill your prescription before you run out.  After discharge, you should have regular check-up appointments with your healthcare provider that is prescribing your Eliquis.  In the future your dose may need to be changed if your kidney function or weight changes by a significant amount or as you get older.  What do you do if you miss a dose? If you miss a dose, take it as soon as you remember on the same day and resume taking twice daily.  Do not take more than one dose of ELIQUIS at the same time to make up a missed dose.  Important Safety Information A possible side effect of Eliquis is bleeding. You should call your healthcare provider right away if you experience any of the following: Bleeding from an injury or your nose that does not stop. Unusual colored urine (red or dark brown) or unusual colored stools (red or black). Unusual bruising for unknown reasons. A serious fall or if you hit your head (even if there is no  bleeding).  Some medicines may interact with Eliquis and might increase your risk of bleeding or clotting while on Eliquis. To help avoid this, consult your healthcare provider or pharmacist prior to using any new prescription or non-prescription medications, including herbals, vitamins, non-steroidal anti-inflammatory drugs (NSAIDs) and supplements.  This  website has more information on Eliquis (apixaban): http://www.eliquis.com/eliquis/home

## 2022-06-02 NOTE — Progress Notes (Signed)
RT went to check on pt for 0300 rounds, pt is currently off bipap at this time and states he will "put it back on in a sec."

## 2022-06-02 NOTE — Progress Notes (Signed)
RT went to place pt on bipap for the night, pt stated he could place self on machine when ready for bed.

## 2022-06-02 NOTE — Progress Notes (Signed)
Mobility Specialist Progress Note:   06/02/22 0935  Mobility  Activity Stood at bedside  Level of Assistance Standby assist, set-up cues, supervision of patient - no hands on  Assistive Device None  Activity Response Tolerated fair  Mobility Referral Yes  $Mobility charge 1 Mobility   Pt agreeable to minimal mobility at this time, declined ambulation d/t fatigue. Pt able to stand at EOB for ~53mn while still on Bipap. No c/o dizziness/lightheadedness. Of note, pt states he ambulated to BR this am with no supplemental O2, reports SpO2 75% and extreme lightheadedness. Pt left sitting EOB with all needs met.   [facility] Secure Chat or Office Phone: [phone]

## 2022-06-03 DIAGNOSIS — I4819 Other persistent atrial fibrillation: Secondary | ICD-10-CM | POA: Diagnosis not present

## 2022-06-03 DIAGNOSIS — I5023 Acute on chronic systolic (congestive) heart failure: Secondary | ICD-10-CM | POA: Diagnosis not present

## 2022-06-03 DIAGNOSIS — J9601 Acute respiratory failure with hypoxia: Secondary | ICD-10-CM | POA: Diagnosis not present

## 2022-06-03 DIAGNOSIS — I483 Typical atrial flutter: Secondary | ICD-10-CM | POA: Diagnosis not present

## 2022-06-03 DIAGNOSIS — Z20822 Contact with and (suspected) exposure to covid-19: Secondary | ICD-10-CM | POA: Diagnosis not present

## 2022-06-03 DIAGNOSIS — J189 Pneumonia, unspecified organism: Secondary | ICD-10-CM | POA: Diagnosis not present

## 2022-06-03 LAB — CBC
HCT: 22.2 % — ABNORMAL LOW (ref 39.0–52.0)
Hemoglobin: 8.2 g/dL — ABNORMAL LOW (ref 13.0–17.0)
MCH: 37.8 pg — ABNORMAL HIGH (ref 26.0–34.0)
MCHC: 36.9 g/dL — ABNORMAL HIGH (ref 30.0–36.0)
MCV: 102.3 fL — ABNORMAL HIGH (ref 80.0–100.0)
Platelets: 366 10*3/uL (ref 150–400)
RBC: 2.17 MIL/uL — ABNORMAL LOW (ref 4.22–5.81)
RDW: 14.6 % (ref 11.5–15.5)
WBC: 15.5 10*3/uL — ABNORMAL HIGH (ref 4.0–10.5)
nRBC: 0 % (ref 0.0–0.2)

## 2022-06-03 LAB — BASIC METABOLIC PANEL
Anion gap: 16 — ABNORMAL HIGH (ref 5–15)
BUN: 53 mg/dL — ABNORMAL HIGH (ref 6–20)
CO2: 21 mmol/L — ABNORMAL LOW (ref 22–32)
Calcium: 8.6 mg/dL — ABNORMAL LOW (ref 8.9–10.3)
Chloride: 94 mmol/L — ABNORMAL LOW (ref 98–111)
Creatinine, Ser: 7.99 mg/dL — ABNORMAL HIGH (ref 0.61–1.24)
GFR, Estimated: 8 mL/min — ABNORMAL LOW (ref >60)
Glucose, Bld: 113 mg/dL — ABNORMAL HIGH (ref 70–99)
Potassium: 3.5 mmol/L (ref 3.5–5.1)
Sodium: 131 mmol/L — ABNORMAL LOW (ref 135–145)

## 2022-06-03 MED ORDER — ANTICOAGULANT SODIUM CITRATE 4% (200MG/5ML) IV SOLN
5.0000 mL | Freq: Once | Status: AC
  Administered 2022-06-03: 5 mL
  Filled 2022-06-03 (×3): qty 5

## 2022-06-03 MED ORDER — CHLORHEXIDINE GLUCONATE CLOTH 2 % EX PADS: 6.0000 | MEDICATED_PAD | Freq: Every day | CUTANEOUS | Status: DC

## 2022-06-03 MED ORDER — HEPARIN SODIUM (PORCINE) 1000 UNIT/ML IJ SOLN
INTRAMUSCULAR | Status: AC
  Filled 2022-06-03: qty 5

## 2022-06-03 NOTE — Progress Notes (Signed)
Subjective:  Patient feeling okay this morning, breathing has been relatively unlabored. Believes that the prednisone has been helping. Expressed hesitancy to take certain medications including metoprolol and midodrine given their symptom profile. Counseled on benefits and importance of these medications, patient stated willingness to take them as prescribed.     Objective: Vitals over previous 24hr: Vitals:   06/03/22 0518 06/03/22 0523 06/03/22 0618 06/03/22 0844  BP:  91/63  111/75  Pulse: (!) 128 (!) 109 (!) 134 (!) 134  Resp:  18  19  Temp:  97.6 F (36.4 C)  98.4 F (36.9 C)  TempSrc:  Oral  Oral  SpO2: 96% 100% 100% 100%  Weight:      Height:        General:      awake and alert, sitting comfortably on bed, cooperative, not in acute distress Skin:       warm and dry, intact without any obvious lesions or scars, no rashes Head:      normocephalic and atraumatic, oral mucosa moist Lungs:      increased respiratory effort with intermittent coughing, breathing slightly labored, symmetrical chest rise, mildly decreased sounds in lower fields but generally difficult to appreciate given body habitus, no crackles or wheezing Cardiac:      tachycardic with irregular rate, normal S1 and S2 Neurologic:      oriented to person-place-time, moving all extremities, no gross focal deficits Psychiatric:      mood and affect normal, intelligible speech    Assessment/Plan: Mr Rajewski is a 36 year old male with a past medical history of morbid obesity, HFrEF, OSA, OHS, AFib-AFlutter who presented with breath shortness, lightheadedness, and palpitations, now admitted for chronic hypoxic respiratory failure and atrial fibrillation.    ---Acute on chronic hypoxic respiratory failure ---Amiodarone toxicity ---Community acquired pneumonia ---Obstructive sleep apnea on nightly BiPAP ---Obesity hypoventilation syndrome Patient presented on 10-28 with breath shortness, found to have  atrial flutter rapid ventricular response and hypotension. Of note patient started amiodarone for atrial fibrillation on 10-26, which was subsequently discontinued upon arrival. Chest radiograph revealed evidence of pulmonary venous congestion and CT showed bilateral consolidations concerning for possible pneumonia. Five-day course of ceftriaxone and doxycycline were completed on 11-6, after which repeat imaging failed to demonstrate any improvement. Volume status has been managed with hemodialysis throughout hospitalization and oxygen requirements have decreased since admission. Inciting event remains unclear, though given lack of improvement with antibiotic therapy, amiodarone toxicity is strong possibility. Course of prednisone was started on 11-7 to address the likely inflammatory component, patient reports symptomatic improvement since starting this medication. > Fluticasone-vilanterol 100-25ug q24 > Prednisone 40mg  q24 > Administer BiPAP nightly > Wean oxygen requirements as tolerated, currently 5L [place] > Volume management with hemodialysis > Trend CBC q24   ---End-stage renal disease on hemodialysis Patient missed hemodialysis session on 10-28 prior to ED arrival. Estimated dry weight around 175-180kg, most recently at 183kg. Volume overload likely contributing to respiratory failure, which has been managed with hemodialysis per his usual Tu-Th-Sa schedule throughout current hospitalization. > Nephrology consult, appreciate recommendations > Hemodialysis on Tu-Th-Sa > Torsemide 100mg  on Mo-We-Fr-Su > Trend BMP q24   ---Heart failure with reduced ejection fraction ---Hypotension Echocardiogram performed 02-2022 demonstrated ejection fraction of 20-25% with history negative for an ischemic event. Patient presented with lightheadedness and breath shortness. Upon arrival, troponin levels were slightly elevated and BNP at baseline. Management of heart failure is limited due to ESRD, hypotension  requiring midodrine, and morbid obesity status. Acute  heart failure exacerbation potentially contributing to patient's symptoms and volume status is currently managed with hemodialysis.  > Cardiology consult, appreciate recommendations > Midodrine 15mg  q8 > Volume management with hemodialysis on Tu-Th-Sa > Torsemide 100mg  on Mo-We-Fr-Su   ---Atrial flutter post two cardioversions Patient presented on 10-28 with symptomatic tachyarrhythmia and hypotension, found to have atrial flutter plus rapid ventricular response. Atrial flutter initially resolved after cardioversion, but episodes have since returned despite a second attempt. Internal medicine team is holding amiodarone given likely pulmonary toxicity. Given multiple chronic conditions, treatment modalities for his atrial fibrillation are very limited. Cardiology and electrophysiology teams are considering options, possibly high-risk ablation followed by pacemaker. Metoprolol is currently providing rate control. Treatment team will continue to monitor his blood pressure carefully and encourage adherence to midodrine. > Cardiology consult, appreciate recommendations > Metoprolol 37.5mg  q12 > Apixaban 5mg  q24   ---Iron deficiency anemia Laboratory testing in 03-2022 consistent with iron deficiency anemia. Upon arrival, hemoglobin was 8.8 and has been stable since admission. > Consider ferrous sulfate supplementation upon discharge     Principal Problem:   Acute hypoxic respiratory failure ([name]) Active Problems:   OSA (obstructive sleep apnea)   Chronic systolic CHF (congestive heart failure) ([name])   Paroxysmal atrial flutter ([name])   Acute respiratory failure with hypoxia ([name])   ESRD (end stage renal disease) on dialysis ([name])   Hypotension   Multiple nodules of lung   Elevated sed rate (elev SR)   Elevated C-reactive protein   Acute on chronic respiratory failure with hypoxia ([place])    Prior to Admission Living Arrangement:  home Anticipated Discharge Location: home Barriers to Discharge: medical management Dispo: Anticipated discharge in approximately 2-3 day(s).    Roswell Nickel, MD Internal Medicine PGY-1 Pager [phone]  After 5pm on weekdays and 1pm on weekends: On Call pager [phone]

## 2022-06-03 NOTE — Progress Notes (Addendum)
Patient requested PIV dressing be changed, site was cleaned and dressing was changed per protocol.   Patient proceeded to tape over PIV, with paper tape stating " sometimes you gotta hack things". Education was provided regarding the importance oF a clean, undisturbed dressing.   Patient verbalized understanding.

## 2022-06-03 NOTE — Progress Notes (Signed)
   06/03/22 1926  Assess: MEWS Score  Temp 98 F (36.7 C)  BP 113/82  MAP (mmHg) 93  Pulse Rate (!) 133  ECG Heart Rate (!) 132  Resp 20  Level of Consciousness Alert  SpO2 96 %  O2 Device HFNC  Patient Activity (if Appropriate) In bed  O2 Flow Rate (L/min) 6 L/min  Assess: MEWS Score  MEWS Temp 0  MEWS Systolic 0  MEWS Pulse 3  MEWS RR 0  MEWS LOC 0  MEWS Score 3  MEWS Score Color Yellow  Assess: if the MEWS score is Yellow or Red  Were vital signs taken at a resting state? Yes  Focused Assessment No change from prior assessment  Does the patient meet 2 or more of the SIRS criteria? Yes  Does the patient have a confirmed or suspected source of infection? Yes  Provider and Rapid Response Notified? No  MEWS guidelines implemented *See Row Information* No, other (Comment) (Received pt with red MEWS,MD already aware of the situation.)  Treat  MEWS Interventions Other (Comment)  Pain Scale 0-10  Pain Score 0  Assess: SIRS CRITERIA  SIRS Temperature  0  SIRS Pulse 1  SIRS Respirations  0  SIRS WBC 1  SIRS Score Sum  2

## 2022-06-03 NOTE — Plan of Care (Signed)
  Problem: Education: Goal: Knowledge of General Education information will improve Description: Including pain rating scale, medication(s)/side effects and non-pharmacologic comfort measures Outcome: Progressing   Problem: Activity: Goal: Risk for activity intolerance will decrease Outcome: Progressing   Problem: Coping: Goal: Level of anxiety will decrease Outcome: Progressing   

## 2022-06-03 NOTE — Progress Notes (Signed)
Transport at bedside to take patient to dialysis, pt refused PO2 monitor.   Threw it off this finger and said "I don't need that".   RRT to accompany with BiPap for treatment.   Patient appears comfortable, 6LNC available. No concerns at this time.

## 2022-06-03 NOTE — Progress Notes (Signed)
Patient verbalizes frustration related to dialysis treatment and is requesting a physician at bedside.   Page was made to IM team.

## 2022-06-03 NOTE — Progress Notes (Signed)
RT transported bipap on standby with pt to dialysis. Once we arrived to dialysis bipap was hooked up, RT asked pt if he was ready to go on machine pt stated he wasn't and that he would place himself on machine once he got sleepy. Pt requested to be placed on 7L. RT told pt to call if he needed anything. O2 96% at this time.

## 2022-06-03 NOTE — Progress Notes (Signed)
Received patient in bed to unit.  Alert and oriented.  Informed consent signed and in chart.   Treatment initiated: 0232 Treatment completed: 0518  Patient tolerated well.  Transported back to the room  Alert, without acute distress.  Hand-off given to patient's nurse.   Access used: catheter Access issues: none  Total UF removed: 2100 Medication(s) given: none Post HD VS: 97.6, 91/63(74), HR-109, RR-18, SP02-100 Post HD weight: unable to get   [facility]

## 2022-06-03 NOTE — Procedures (Signed)
The heparin fills were given to me but, I did not get any Heparin in [place] or in cath. dwells, did not  give Heparin Fills.  I threw the Heparin fills to away as soon as I reached the PT, because I knew the story, do to reaching the hit PT warning all ready. The PT and I agree that we should have Sodium Citrate in our pixsu.

## 2022-06-03 NOTE — Progress Notes (Addendum)
Patient ID: Phillip Young, male   DOB: 1986/03/14, 36 y.o.   MRN: [id number] P    Advanced Heart Failure Rounding Note  PCP-Cardiologist: [name], MD   Subjective:    Admitted w/ AFL w/ RVR + acute hypoxic respiratory failure due to PNA vs amiodarone lung toxicity.   CT chest: mass-like consolidations and clustered areas of nodularity bilaterally, multifocal PNA or drug toxicity.   10/28: emergent DCCV in ED>>NSR 11/2: reverted back to AFL w/ RVR,. PO amio discontinued. Started metoprolol 12.31m q6h. Abx started for PNA 11/3: DCCV to NSR 11/6: Back in AFL. Completed PNA coverage with ceftriaxone/doxycycline. Repeat ESR higher 113>>135  11/7: Started prednisone for possible amiodarone toxicity  Feels better this morning, wants to walk around. Pt back in ST for now. Denies CP and SOB.  Pt stopped taking midodrine 11/7, has been refusing since. Also "self-weaning" off metoprolol. Apparently never took the full 37.5 and only took 1/2 tab yesterday. Asked RN to watch pt take meds and document what he takes/doesn't take. Lots of education provided, stated "I know my body better than anyone, I only came in taking Eliquis, I don't want all these other drugs".   Objective:   Weight Range: (!) 183.3 kg Body mass index is 54.81 kg/m.   Vital Signs:   Temp:  [97.6 F (36.4 C)-98.7 F (37.1 C)] 97.6 F (36.4 C) (11/09 0523) Pulse Rate:  [109-134] 134 (11/09 0618) Resp:  [16-30] 18 (11/09 0523) BP: (91-142)/(50-98) 91/63 (11/09 0523) SpO2:  [96 %-100 %] 100 % (11/09 0618) Last BM Date : 06/02/22 (PT REPORTED)  Weight change: Filed Weights   05/29/22 0751 06/01/22 0752 06/01/22 0949  Weight: (!) 182.9 kg (!) 184.2 kg (!) 183.3 kg    Intake/Output:   Intake/Output Summary (Last 24 hours) at 06/03/2022 0751 Last data filed at 06/03/2022 0518 Gross per 24 hour  Intake 400 ml  Output 2700 ml  Net -2300 ml      Physical Exam    General:  well, obese appearing.  No respiratory  difficulty HEENT: normal Neck: supple. JVD difficult to assess, thick neck. Carotids 2+ bilat; no bruits. No lymphadenopathy or thyromegaly appreciated. Cor: PMI nondisplaced. Tachy norm rhythm. No rubs, gallops or murmurs. Lungs: shallow breaths, diminished bases Abdomen: soft, nontender, nondistended. No hepatosplenomegaly. No bruits or masses. Good bowel sounds. Extremities: no cyanosis, clubbing, rash, edema  Neuro: alert & oriented x 3, cranial nerves grossly intact. moves all 4 extremities w/o difficulty. Affect pleasant.   Telemetry   ST vs flutter 130s (personally reviewed)  EKG    No new EKG to review   Labs    CBC Recent Labs    06/01/22 0426 06/02/22 0633  WBC 11.2* 13.0*  HGB 8.2* 8.0*  HCT 23.3* 21.8*  MCV 105.0* 102.8*  PLT 310 3294  Basic Metabolic Panel Recent Labs    06/01/22 0426 06/02/22 0633  NA 136 135  K 3.4* 3.7  CL 97* 98  CO2 20* 19*  GLUCOSE 113* 104*  BUN 73* 67*  CREATININE 11.73* 11.36*  CALCIUM 8.5* 8.5*   Liver Function Tests No results for input(s): "AST", "ALT", "ALKPHOS", "BILITOT", "PROT", "ALBUMIN" in the last 72 hours.  No results for input(s): "LIPASE", "AMYLASE" in the last 72 hours. Cardiac Enzymes No results for input(s): "CKTOTAL", "CKMB", "CKMBINDEX", "TROPONINI" in the last 72 hours.  BNP: BNP (last 3 results) Recent Labs    02/18/22 0154 03/26/22 1958 05/22/22 0732  BNP 373.8* 374.2* 373.1*  ProBNP (last 3 results) No results for input(s): "PROBNP" in the last 8760 hours.   D-Dimer No results for input(s): "DDIMER" in the last 72 hours. Hemoglobin A1C No results for input(s): "HGBA1C" in the last 72 hours. Fasting Lipid Panel No results for input(s): "CHOL", "HDL", "LDLCALC", "TRIG", "CHOLHDL", "LDLDIRECT" in the last 72 hours. Thyroid Function Tests No results for input(s): "TSH", "T4TOTAL", "T3FREE", "THYROIDAB" in the last 72 hours.  Invalid input(s): "FREET3"   Other results:   Imaging     No results found.   Medications:     Scheduled Medications:  (feeding supplement) PROSource Plus  30 mL Oral BID BM   apixaban  5 mg Oral BID   Chlorhexidine Gluconate Cloth  6 each Topical Q0600   darbepoetin (ARANESP) injection - DIALYSIS  200 mcg Subcutaneous Q Sat-1800   fluticasone furoate-vilanterol  1 puff Inhalation Daily   heparin sodium (porcine)       lanthanum  2,000 mg Oral TID WC   metoprolol succinate  37.5 mg Oral BID   midodrine  15 mg Oral TID WC   predniSONE  40 mg Oral Q breakfast   torsemide  100 mg Oral Once per day on Sun Mon Wed Fri    Infusions:    PRN Medications: acetaminophen **OR** acetaminophen, heparin sodium (porcine), polyethylene glycol, senna-docusate    Patient Profile   36 y/o male w/ chronic biventricular heart failure due to NICM, PAF/AFL s/p multiple DCCVs, multiple intolerances to amiodarone, ESRD on iHD, OSA on CPAP and morbid obesity, admitted for acute hypoxic respiratory failure and hypotension in the setting of recurrent ALF w/ RVR, requiring emergent DCCV in ED. Now w/ concern for potential amiodarone pulmonary toxicity. Also being treated for possible PNA.   Assessment/Plan   1. Atrial flutter/fibrillation: History of multiple DCCVs. Amiodarone previously stopped due to elevated LFTs. Restarted 05/20/22 by Dr. Curt Bears.  Admitted w/ hemodynamically unstable AFL w/ RVR requiring emergent DCCV>>NSR in ER.  Atrial flutter recurred with RVR post-DCCV.  Now w/ concern for possible amiodarone pulmonary toxicity w/ elevated ESR 113 and abnormal high resolution chest CT showing mass-like consolidations and clustered areas of nodularity bilaterally, multifocal PNA or drug toxicity. Amiodarone stopped (he says breathing got worse after restarting on amiodarone).  DCCV twice this admission but fibrillation and flutter have recurred.  I reviewed ECGs with EP, he has been in BOTH atrial flutter (typical and atypical) and fibrillation this  admission.  HR 130s, flutter vs ST today.  - Per Dr. Curt Bears, he is not a candidate for atrial flutter or fibrillation ablation.  Has multiple flutter circuits and also has had fibrillation this admission.  Would not ablate flutter alone without treating atrial fibrillation, and he would not be a candidate for atrial fibrillation ablation.  - We are struggling to rate control him, increased Toprol XL to 37.5 mg bid and midodrine to 15 mg tid yesterday. Has not been taking doses as prescribed.   - Poor candidate for AV nodal ablation with BiV pacing due to ESRD and infection risk, ?revisit if we can't rate control  him.  - Not candidate for amiodarone or Tikosyn, no anti-arrhythmic option.  Without anti-arrhythmic, no benefit to be derived from cardioverting him again.  - Continue Eliquis 5 mg bid  - Will repeat EKG 2.  Chronic systolic CHF: Nonischemic CMP, LHC 2021 with no CAD. Previous hospitalizations w/shock in setting of rapid AFib/Flutter requiring milrinone. Most recent echo 8/23 with EF 20-25%, RV mildly reduced. ESRD, volume  controlled by HD.   - GDMT limited by CKD and hypotension requiring midodrine  - Not candidate for ICD given obesity and dialysis  - Not candidate for advanced therapies.  3. Acute Hypoxic Respiratory Failure: Combination of volume overload and multifocal PNA.  High resolution CT chest showed mass-like consolidations and clustered areas of nodularity bilaterally, multifocal PNA or drug toxicity. Unclear if lung opacifications are infectious or inflammatory. Repeat ESR higher 113>>135. Suspect amiodarone lung toxicity, pulmonary following.  He has completed a course of abx with no improvement in CT chest or inflammatory markers.  - Amiodarone has been stopped - Prednisone started, will followup as outpatient with pulmonary. Does feel improvement with prednisone.  4. ESRD: HD per nephrology  - on T,Th,Sat schedule  5. Obesity: Body mass index is 55.64 kg/m.  - wt loss  advised 6. Fe deficiency anemia.  - T-sat < 20%. Received Ferrlecit per primary team  7. OSA: Continue CPAP QHS  Difficult situation as outlined above with very limited options and medication noncompliance.   Length of Stay: [place] AGACNP-BC  06/03/2022 7:51 AM  Advanced Heart Failure Team Pager [phone] (M-F; 7a - 5p)  Please contact [facility] for night-coverage after hours (5p -7a ) and weekends on [email or web address]  Patient seen with NP, agree with the above note.   Patient appears to be in atypical flutter rate around 130.  SBP 130s.  He has been refusing midodrine and trying to cut back on his Toprol XL.  Says he gets lightheaded.   He is now on prednisone per pulmonary.  He still is getting short of breath.   General: NAD Neck: Thick, JVP difficult, no thyromegaly or thyroid nodule.  Lungs: Decreased at bases.  CV: Nondisplaced PMI.  Heart tachy, regular S1/S2, no S3/S4, no murmur.  No peripheral edema.   Abdomen: Soft, nontender, no hepatosplenomegaly, no distention.  Skin: Intact without lesions or rashes.  Neurologic: Alert and oriented x 3.  Psych: Normal affect. Extremities: No clubbing or cyanosis.  HEENT: Normal.   As above, very limited options.  I spoke again with EP, no invasive option (no ablation, pacing).  Not candidate for Tikosyn or amiodarone.  Failed DCCV x 2 this admission.  The best I have at this point is for him to take midodrine 15 mg tid to keep up BP and titrate up Toprol XL, 37.5 mg bid for now.  I talked to him about the rationale for increasing Toprol XL.  He is willing to try the combination of midodrine and Toprol XL.   Continue prednisone for suspected inflammatory lung disease from amiodarone lung toxicity. He remains on supplemental oxygen.   I asked him to start walking in the hall.  I think we are going to have to accept as higher HR for him for home.   [name] 06/03/2022 4:33 PM

## 2022-06-03 NOTE — Progress Notes (Signed)
Patient declining midodrine and full dose of metoprolol for morning dose. RN discussed reasons for medications and informed MD. After discussing with MD, patient agreed to take midodrine. This afternoon patient walked himself to bathroom on 2L O2 and called RN urgently to turn O2 up as he was labored and tachypneic. Patient's HR running consistently in 130s all day. Oxygen staying in 90-100% with 5L. Patient later in day asked to go for a walk. RN declined request for safety reasons and patient HR is uncontrolled and he is declining plan of care to control HR. After discussing with the patient in the evening, he agreed to take his full dose of metoprolol and midodrine. Will continue to monitor.

## 2022-06-03 NOTE — Progress Notes (Signed)
[facility] Progress Note   Subjective:    Pt seen in room, had HD last night. Upset because he says he was almost given heparin locks in his [facility] and has history of HIT. RN did call me this AM and stated heparin was pulled for the treatment tray but he noted patient's allergy before administration of any heparin, no heparin was given and sodium citrate was ordered for dwells. Sodium citrate was delayed because it needed to be sent from pharmacy. MD aware of issue.   Pt feeling ok now, stable SOB. Denies CP, palpitations, dizziness and nausea.   Objective Vitals:   06/03/22 0518 06/03/22 0523 06/03/22 0618 06/03/22 0844  BP:  91/63  111/75  Pulse: (!) 128 (!) 109 (!) 134 (!) 134  Resp:  18  19  Temp:  97.6 F (36.4 C)  98.4 F (36.9 C)  TempSrc:  Oral  Oral  SpO2: 96% 100% 100% 100%  Weight:      Height:       Physical Exam General: Alert male in NAD Heart: Irregular rhythm, tachycardic, no murmur Lungs: Decreased breath sounds bilaterally, no wheezing or rales Abdomen: Soft, non-distended, +BS Extremities: No edema b/l lower extremities Dialysis Access:  R [facility]  Additional Objective Labs: Basic Metabolic Panel: Recent Labs  Lab 05/29/22 0343 05/30/22 0352 06/01/22 0426 06/02/22 0633 06/03/22 0722  NA 135 133* 136 135 131*  K 4.8 4.1 3.4* 3.7 3.5  CL 92* 95* 97* 98 94*  CO2 19* 21* 20* 19* 21*  GLUCOSE 100* 113* 113* 104* 113*  BUN 54* 53* 73* 67* 53*  CREATININE 9.58* 8.68* 11.73* 11.36* 7.99*  CALCIUM 9.4 9.1 8.5* 8.5* 8.6*  PHOS 5.4* 4.6  --   --   --    Liver Function Tests: Recent Labs  Lab 05/28/22 0649 05/29/22 0343 05/30/22 0352  AST 21  --   --   ALT 27  --   --   ALKPHOS 101  --   --   BILITOT 1.1  --   --   PROT 8.3*  --   --   ALBUMIN 3.4* 3.2* 3.0*   No results for input(s): "LIPASE", "AMYLASE" in the last 168 hours. CBC: Recent Labs  Lab 05/30/22 0352 05/31/22 0414 06/01/22 0426 06/02/22 0633 06/03/22 0722  WBC  13.5* 12.2* 11.2* 13.0* 15.5*  HGB 8.7* 8.8* 8.2* 8.0* 8.2*  HCT 24.1* 23.6* 23.3* 21.8* 22.2*  MCV 104.3* 103.5* 105.0* 102.8* 102.3*  PLT 285 266 310 328 366   Blood Culture    Component Value Date/Time   SDES BLOOD LEFT HAND 03/26/2022 1958   SDES BLOOD RIGHT HAND 03/26/2022 1958   [place]  03/26/2022 1958    BOTTLES DRAWN AEROBIC ONLY Blood Culture adequate volume   SPECREQUEST  03/26/2022 1958    BOTTLES DRAWN AEROBIC ONLY Blood Culture adequate volume   CULT  03/26/2022 1958    NO GROWTH 5 DAYS Performed at [facility], [place] [address], [place], [place] [address]    CULT  03/26/2022 1958    NO GROWTH 5 DAYS Performed at [place] [address] [place], [place] [address]    REPTSTATUS 03/31/2022 FINAL 03/26/2022 1958   REPTSTATUS 03/31/2022 FINAL 03/26/2022 1958    Cardiac Enzymes: No results for input(s): "CKTOTAL", "CKMB", "CKMBINDEX", "TROPONINI" in the last 168 hours. CBG: No results for input(s): "GLUCAP" in the last 168 hours. Iron Studies: No results for input(s): "IRON", "TIBC", "TRANSFERRIN", "  FERRITIN" in the last 72 hours. @lablastinr3 @ Studies/Results: No results found. Medications:   (feeding supplement) PROSource Plus  30 mL Oral BID BM   apixaban  5 mg Oral BID   Chlorhexidine Gluconate Cloth  6 each Topical Q0600   darbepoetin (ARANESP) injection - DIALYSIS  200 mcg Subcutaneous Q Sat-1800   fluticasone furoate-vilanterol  1 puff Inhalation Daily   heparin sodium (porcine)       lanthanum  2,000 mg Oral TID WC   metoprolol succinate  37.5 mg Oral BID   midodrine  15 mg Oral TID WC   predniSONE  40 mg Oral Q breakfast   torsemide  100 mg Oral Once per day on Sun Mon Wed Fri    Outpatient Dialysis Orders: [facility] TTS 4:15 425/A1.5x 180 kg 3K/2.5Ca TDC  - Heparin: None/HIT Ab POSITIVE - Mircera 120 q 4 wks (last 10/12) - Hectorol 6 TIW, Sensipar 30 TIW  Assessment/Plan: 1. Atrial flutter with RVR: S/P cardioversion on 10/28  and then again on 11/3 with discontinuation of amiodarone with CT chest concerning for amiodarone lung. Plans noted for ablation likely this week 2. ESRD: On TTS dialysis schedule and was on hemodialysis Tuesday but discontinued after he became more tachycardic after getting more information about his heart/lungs. Had HD early this AM. Off schedule, will plan for HD tomorrow AM then resume his regular schedule. Sodium citrate should be ordered for all TDC locks, no heparin.  3. Anemia:Low H/H without overt loss, will continue ESA.  4. CKD-MBD: Calcium level at goal with phosphorus level acceptable on lanthanum.  5. Nutrition: Did not like renal diet. K+ has been controlled, can trial heart healthy diet but he agrees to go back on renal diet if K+/phos worsen. Discussed that he needs to continue fluid restrictions.  6. Hypotension:Blood pressure acceptable. Was refusing metoprolol and midodrine due to concern about side effects, now taking them after talking to cardiologist.   Anice Paganini, PA-C 06/03/2022, 12:16 PM  [name] Pager: [phone]

## 2022-06-04 DIAGNOSIS — J9601 Acute respiratory failure with hypoxia: Secondary | ICD-10-CM | POA: Diagnosis not present

## 2022-06-04 DIAGNOSIS — I483 Typical atrial flutter: Secondary | ICD-10-CM | POA: Diagnosis not present

## 2022-06-04 LAB — RENAL FUNCTION PANEL
Albumin: 2.9 g/dL — ABNORMAL LOW (ref 3.5–5.0)
Anion gap: 19 — ABNORMAL HIGH (ref 5–15)
BUN: 73 mg/dL — ABNORMAL HIGH (ref 6–20)
CO2: 18 mmol/L — ABNORMAL LOW (ref 22–32)
Calcium: 8.7 mg/dL — ABNORMAL LOW (ref 8.9–10.3)
Chloride: 98 mmol/L (ref 98–111)
Creatinine, Ser: 10.05 mg/dL — ABNORMAL HIGH (ref 0.61–1.24)
GFR, Estimated: 6 mL/min — ABNORMAL LOW (ref >60)
Glucose, Bld: 115 mg/dL — ABNORMAL HIGH (ref 70–99)
Phosphorus: 7.1 mg/dL — ABNORMAL HIGH (ref 2.5–4.6)
Potassium: 5.2 mmol/L — ABNORMAL HIGH (ref 3.5–5.1)
Sodium: 135 mmol/L (ref 135–145)

## 2022-06-04 LAB — VITAMIN B12: Vitamin B-12: 5998 pg/mL — ABNORMAL HIGH (ref 180–914)

## 2022-06-04 LAB — CBC
HCT: 22.4 % — ABNORMAL LOW (ref 39.0–52.0)
Hemoglobin: 7.9 g/dL — ABNORMAL LOW (ref 13.0–17.0)
MCH: 37.6 pg — ABNORMAL HIGH (ref 26.0–34.0)
MCHC: 35.3 g/dL (ref 30.0–36.0)
MCV: 106.7 fL — ABNORMAL HIGH (ref 80.0–100.0)
Platelets: 425 10*3/uL — ABNORMAL HIGH (ref 150–400)
RBC: 2.1 MIL/uL — ABNORMAL LOW (ref 4.22–5.81)
RDW: 14.8 % (ref 11.5–15.5)
WBC: 18.3 10*3/uL — ABNORMAL HIGH (ref 4.0–10.5)
nRBC: 0.1 % (ref 0.0–0.2)

## 2022-06-04 LAB — FOLATE: Folate: 15.7 ng/mL (ref >5.9)

## 2022-06-04 LAB — TSH: TSH: 3.261 u[IU]/mL (ref 0.350–4.500)

## 2022-06-04 MED ORDER — ONDANSETRON HCL 4 MG/2ML IJ SOLN
4.0000 mg | Freq: Once | INTRAMUSCULAR | Status: AC
  Administered 2022-06-04: 4 mg via INTRAVENOUS
  Filled 2022-06-04: qty 2

## 2022-06-04 MED ORDER — ONDANSETRON HCL 4 MG/2ML IJ SOLN
4.0000 mg | Freq: Once | INTRAMUSCULAR | Status: AC
  Administered 2022-06-04: 4 mg via INTRAMUSCULAR
  Filled 2022-06-04: qty 2

## 2022-06-04 MED ORDER — ONDANSETRON HCL 4 MG/2ML IJ SOLN: 4.0000 mg | Freq: Once | INTRAMUSCULAR | Status: DC

## 2022-06-04 MED ORDER — METOPROLOL SUCCINATE ER 25 MG PO TB24
25.0000 mg | ORAL_TABLET | Freq: Two times a day (BID) | ORAL | Status: DC
  Administered 2022-06-04 – 2022-06-10 (×10): 25 mg via ORAL
  Filled 2022-06-04 (×11): qty 1

## 2022-06-04 MED ORDER — MIDODRINE HCL 5 MG PO TABS
5.0000 mg | ORAL_TABLET | Freq: Three times a day (TID) | ORAL | Status: DC
  Administered 2022-06-04 – 2022-06-10 (×13): 5 mg via ORAL
  Filled 2022-06-04 (×15): qty 1

## 2022-06-04 MED ORDER — ANTICOAGULANT SODIUM CITRATE 4% (200MG/5ML) IV SOLN
5.0000 mL | Status: DC | PRN
  Administered 2022-06-04: 5 mL
  Filled 2022-06-04: qty 5

## 2022-06-04 NOTE — Progress Notes (Signed)
   06/04/22 1833  Vitals  Temp 98.3 F (36.8 C)  Temp Source Oral  BP (!) 118/93  MAP (mmHg) 101  BP Location Right Wrist  BP Method Automatic  Patient Position (if appropriate) Lying  Pulse Rate (!) 117  Pulse Rate Source Monitor  ECG Heart Rate (!) 122  Resp (!) 32  Oxygen Therapy  SpO2 100 %  O2 Device Bi-PAP  FiO2 (%) 70 %  Pulse Oximetry Type Continuous   Received patient in bed to unit.  Alert and oriented.  Informed consent signed and in chart.   Treatment initiated: 1430 Treatment completed: 1823  Patient tolerated well.  Transported back to the room  Alert, without acute distress.  Hand-off given to patient's nurse.   Access used: HD cath Access issues:lines needed to be reversed  Total UF removed: 2500 ml Medication(s) given: Na+ citrate dwells 4.76ml Post HD VS: see above Post HD weight: 182kg   [facility]

## 2022-06-04 NOTE — Progress Notes (Signed)
Patient verbalize  the concern that Metoprolol is affecting his breathing and making him lightheaded and doesn't want to take a morning dose.Asked RN to make note of it. Provided education on importance of medication.  Pt keeps asking for "cola" "apple juice" despite repeatedly educating about the importance of fluid restriction.Pt stated 'I am really sick and need to keep rehydrated".

## 2022-06-04 NOTE — Progress Notes (Signed)
Mobility Specialist Progress Note:   06/04/22 0930  Mobility  Activity  (bed mob + exercises)  Level of Assistance Standby assist, set-up cues, supervision of patient - no hands on  Assistive Device None  Activity Response Tolerated fair  Mobility Referral Yes  $Mobility charge 1 Mobility   Pt received with incr WOB, SOB and fatigue. States it is d/t being "drugged up". Pt declining all OOB mobility at this time. Able to go EOB to supine independently. Pt educated on importance of mobility, pt agrees. Will f/u as time permits.   Nelta Numbers Mobility Specialist Please contact via SecureChat or  Rehab office at [phone]

## 2022-06-04 NOTE — Progress Notes (Addendum)
Patient ID: Phillip Young, male   DOB: July 22, 1986, 36 y.o.   MRN: [id number] P    Advanced Heart Failure Rounding Note  PCP-Cardiologist: [name], MD   Subjective:    Admitted w/ AFL w/ RVR + acute hypoxic respiratory failure due to PNA vs amiodarone lung toxicity.   CT chest: mass-like consolidations and clustered areas of nodularity bilaterally, multifocal PNA or drug toxicity.   10/28: emergent DCCV in ED>>NSR 11/2: reverted back to AFL w/ RVR,. PO amio discontinued. Started metoprolol 12.20m q6h. Abx started for PNA 11/3: DCCV to NSR 11/6: Back in AFL. Completed PNA coverage with ceftriaxone/doxycycline. Repeat ESR higher 113>>135  11/7: Started prednisone for possible amiodarone toxicity  Feels bad this morning after taking full doses of midodrine and metoprolol for the first time, previously refusing/self-dosing. SOB on exam, dizzy and nauseous. Remains in flutter. Denies CP.    Objective:   Weight Range: (!) 183.3 kg Body mass index is 54.81 kg/m.   Vital Signs:   Temp:  [97.6 F (36.4 C)-98.6 F (37 C)] 98.6 F (37 C) (11/10 0739) Pulse Rate:  [114-134] 126 (11/10 0500) Resp:  [19-30] 30 (11/10 0430) BP: (92-136)/(70-92) 123/79 (11/10 0407) SpO2:  [91 %-100 %] 99 % (11/10 0725) FiO2 (%):  [45 %-70 %] 70 % (11/10 0430) Last BM Date : 06/02/22  Weight change: Filed Weights   05/29/22 0751 06/01/22 0752 06/01/22 0949  Weight: (!) 182.9 kg (!) 184.2 kg (!) 183.3 kg    Intake/Output:   Intake/Output Summary (Last 24 hours) at 06/04/2022 0841 Last data filed at 06/04/2022 0300 Gross per 24 hour  Intake 960 ml  Output 100 ml  Net 860 ml      Physical Exam    General:  ill appearing.  No respiratory difficulty HEENT: normal Neck: supple. No JVD. Carotids 2+ bilat; no bruits. No lymphadenopathy or thyromegaly appreciated. Cor: PMI nondisplaced. Irregular rate & rhythm. No rubs, gallops or murmurs. Lungs: course on expiration Abdomen: soft, nontender,  nondistended. No hepatosplenomegaly. No bruits or masses. Good bowel sounds. Extremities: no cyanosis, clubbing, rash, edema  Neuro: alert & oriented x 3, cranial nerves grossly intact. moves all 4 extremities w/o difficulty. Affect pleasant.   Telemetry   Atrial flutter 130s (personally reviewed)  EKG    Atrial flutter with 2:1 A-V conduction 136 bpm ST & T wave abnormality, consider lateral ischemia   Labs    CBC Recent Labs    06/03/22 0722 06/04/22 0255  WBC 15.5* 18.3*  HGB 8.2* 7.9*  HCT 22.2* 22.4*  MCV 102.3* 106.7*  PLT 366 4742   Basic Metabolic Panel Recent Labs    06/03/22 0722 06/04/22 0255  NA 131* 135  K 3.5 5.2*  CL 94* 98  CO2 21* 18*  GLUCOSE 113* 115*  BUN 53* 73*  CREATININE 7.99* 10.05*  CALCIUM 8.6* 8.7*  PHOS  --  7.1*   Liver Function Tests Recent Labs    06/04/22 0255  ALBUMIN 2.9*    No results for input(s): "LIPASE", "AMYLASE" in the last 72 hours. Cardiac Enzymes No results for input(s): "CKTOTAL", "CKMB", "CKMBINDEX", "TROPONINI" in the last 72 hours.  BNP: BNP (last 3 results) Recent Labs    02/18/22 0154 03/26/22 1958 05/22/22 0732  BNP 373.8* 374.2* 373.1*    ProBNP (last 3 results) No results for input(s): "PROBNP" in the last 8760 hours.   D-Dimer No results for input(s): "DDIMER" in the last 72 hours. Hemoglobin A1C No results for input(s): "HGBA1C"  in the last 72 hours. Fasting Lipid Panel No results for input(s): "CHOL", "HDL", "LDLCALC", "TRIG", "CHOLHDL", "LDLDIRECT" in the last 72 hours. Thyroid Function Tests No results for input(s): "TSH", "T4TOTAL", "T3FREE", "THYROIDAB" in the last 72 hours.  Invalid input(s): "FREET3"   Other results:   Imaging    No results found.   Medications:     Scheduled Medications:  (feeding supplement) PROSource Plus  30 mL Oral BID BM   apixaban  5 mg Oral BID   Chlorhexidine Gluconate Cloth  6 each Topical Q0600   darbepoetin (ARANESP) injection -  DIALYSIS  200 mcg Subcutaneous Q Sat-1800   fluticasone furoate-vilanterol  1 puff Inhalation Daily   lanthanum  2,000 mg Oral TID WC   metoprolol succinate  37.5 mg Oral BID   midodrine  15 mg Oral TID WC   predniSONE  40 mg Oral Q breakfast   torsemide  100 mg Oral Once per day on Sun Mon Wed Fri    Infusions:    PRN Medications: acetaminophen **OR** acetaminophen, polyethylene glycol, senna-docusate    Patient Profile   36 y/o male w/ chronic biventricular heart failure due to NICM, PAF/AFL s/p multiple DCCVs, multiple intolerances to amiodarone, ESRD on iHD, OSA on CPAP and morbid obesity, admitted for acute hypoxic respiratory failure and hypotension in the setting of recurrent ALF w/ RVR, requiring emergent DCCV in ED. Now w/ concern for potential amiodarone pulmonary toxicity. Also being treated for possible PNA.   Assessment/Plan   1. Atrial flutter/fibrillation: History of multiple DCCVs. Amiodarone previously stopped due to elevated LFTs. Restarted 05/20/22 by Dr. Curt Bears.  Admitted w/ hemodynamically unstable AFL w/ RVR requiring emergent DCCV>>NSR in ER.  Atrial flutter recurred with RVR post-DCCV.  Now w/ concern for possible amiodarone pulmonary toxicity w/ elevated ESR 113 and abnormal high resolution chest CT showing mass-like consolidations and clustered areas of nodularity bilaterally, multifocal PNA or drug toxicity. Amiodarone stopped (he says breathing got worse after restarting on amiodarone).  DCCV twice this admission but fibrillation and flutter have recurred.  I reviewed ECGs with EP, he has been in BOTH atrial flutter (typical and atypical) and fibrillation this admission.  HR 130s, flutter vs ST today.  - Per Dr. Curt Bears, he is not a candidate for atrial flutter or fibrillation ablation.  Has multiple flutter circuits and also has had fibrillation this admission.  Would not ablate flutter alone without treating atrial fibrillation, and he would not be a candidate  for atrial fibrillation ablation.  - We are struggling to rate control him, increased Toprol XL to 37.5 mg bid and midodrine to 15 mg tid during admission. Apparently had been refusing/self-dosing to what he thought he needed. He took full doses for the first time yesterday/last-night and feels bad this morning. Will cut back his metoprolol to 25 mg BID and midodrine to 5 mg (not to be held on HD days) and on nonHD days, do not give unless SBP <110.  - Hypertensive into 170s during shift. BP 127/70 (86) (personally checked). - Poor candidate for AV nodal ablation with BiV pacing due to ESRD and infection risk, ?revisit if we can't rate control him. EP has evaluated and deemed not a candidate.  - Not candidate for amiodarone or Tikosyn, no anti-arrhythmic option. Will try to cardiovert one last time on Monday, patient open to retrying.  - Continue Eliquis 5 mg bid  2.  Chronic systolic CHF: Nonischemic CMP, LHC 2021 with no CAD. Previous hospitalizations w/shock in setting  of rapid AFib/Flutter requiring milrinone. Most recent echo 8/23 with EF 20-25%, RV mildly reduced. ESRD, volume controlled by HD.   - GDMT limited by CKD and hypotension requiring midodrine  - Not candidate for ICD given obesity and dialysis  - Not candidate for advanced therapies.  3. Acute Hypoxic Respiratory Failure: Combination of volume overload and multifocal PNA.  High resolution CT chest showed mass-like consolidations and clustered areas of nodularity bilaterally, multifocal PNA or drug toxicity. Unclear if lung opacifications are infectious or inflammatory. Repeat ESR higher 113>>135. Suspect amiodarone lung toxicity, pulmonary following.  He has completed a course of abx with no improvement in CT chest or inflammatory markers.  - Amiodarone has been stopped - Prednisone started for suspected inflammatory lung disease from amiodarone lung toxicity, will followup as outpatient with pulmonary. Does feel improvement with  prednisone.  - Sats stable on 6L [place]  - Sounds more course today, encouraged incentive spirometer use, at bedside.  4. ESRD: HD per nephrology  - on T,Th,Sat schedule  5. Obesity: Body mass index is 55.64 kg/m.  - wt loss advised 6. Fe deficiency anemia.  - T-sat < 20%. Received Ferrlecit per primary team  7. OSA: Continue CPAP QHS  Difficult situation as outlined above with very limited options and medication noncompliance. Will reattempt cardioversion on Monday.  Length of Stay: [place] AGACNP-BC  06/04/2022 8:41 AM  Advanced Heart Failure Team Pager [phone] (M-F; 7a - 5p)  Please contact [facility] for night-coverage after hours (5p -7a ) and weekends on [email or web address]  Patient seen with NP, agree with the above note.   BP got really high today with him taking midodrine as ordered.  He developed headache and nausea/vomiting.  Does not want to take as high a dose of Toprol XL.   He is short of breath with most exertion.   He remains in atrial flutter rate 130s.   General: Obese.  Neck: Thick, JVP difficult, no thyromegaly or thyroid nodule.  Lungs: Clear to auscultation bilaterally with normal respiratory effort. CV: Nondisplaced PMI.  Heart tachy, irregular S1/S2, no S3/S4, no murmur.  No peripheral edema.   Abdomen: Soft, nontender, no hepatosplenomegaly, no distention.  Skin: Intact without lesions or rashes.  Neurologic: Alert and oriented x 3.  Psych: Normal affect. Extremities: No clubbing or cyanosis.  HEENT: Normal.   Decrease midodrine to 5 mg tid, hold for SBP > 110.  I will also decrease Toprol XL to 25 mg bid.  No good options for atrial arrhythmias as outlined above.  We had a long discussion today, I am willing to try DCCV one more time on him.  Will plan for Monday.  Discussed risks/benefits and he agrees to procedure.   He continues on prednisone per pulmonary for possible amiodarone lung toxicity.   [name] 06/04/2022 2:35 PM

## 2022-06-04 NOTE — Progress Notes (Signed)
   06/04/22 0407  Assess: MEWS Score  Temp 97.6 F (36.4 C)  BP 123/79  MAP (mmHg) 91  Pulse Rate (!) 119  ECG Heart Rate (!) 127  Resp (!) 22  Level of Consciousness Alert  SpO2 100 %  O2 Device Bi-PAP  Patient Activity (if Appropriate) In bed  O2 Flow Rate (L/min) 6 L/min  Assess: MEWS Score  MEWS Temp 0  MEWS Systolic 0  MEWS Pulse 2  MEWS RR 1  MEWS LOC 0  MEWS Score 3  MEWS Score Color Yellow  Assess: if the MEWS score is Yellow or Red  Were vital signs taken at a resting state? Yes  Focused Assessment No change from prior assessment  Does the patient meet 2 or more of the SIRS criteria? Yes  Does the patient have a confirmed or suspected source of infection? Yes  Provider and Rapid Response Notified? No  MEWS guidelines implemented *See Row Information* No, previously yellow, continue vital signs every 4 hours  Assess: SIRS CRITERIA  SIRS Temperature  0  SIRS Pulse 1  SIRS Respirations  1  SIRS WBC 1  SIRS Score Sum  3

## 2022-06-04 NOTE — Progress Notes (Signed)
Subjective:  Patient feeling okay this morning, but reports an episode of dizziness and gasping last night that he attributed to metoprolol. He was concerned about metoprolol given the high dose. Vomited once earlier this morning and the ondansetron helped a lot. Appreciative of his treatment team, including cardiology's willingness to work with him.   Objective: Vitals over previous 24hr: Vitals:   06/04/22 0739 06/04/22 0902 06/04/22 1152 06/04/22 1200  BP:    116/88  Pulse:    (!) 123  Resp:   (!) 26   Temp: 98.6 F (37 C)  98.1 F (36.7 C)   TempSrc: Oral  Oral Oral  SpO2:  99%  93%  Weight:      Height:        General:      awake and alert, sitting comfortably on bed, cooperative, not in acute distress Skin:       warm and dry, intact without any obvious lesions or scars, no rashes Head:      normocephalic and atraumatic, oral mucosa moist Lungs:      normal respiratory effort, breathing slightly labored, symmetrical chest rise, mildly decreased sounds in lower fields but generally difficult to appreciate given body habitus, no crackles or wheezing Cardiac:      tachycardic with irregular rate, normal S1 and S2 Neurologic:      oriented to person-place-time, moving all extremities, no gross focal deficits Psychiatric:      euthymic mood with appropriate affect, intelligible speech    Assessment/Plan: Phillip Young is a 36 year old male with a past medical history of morbid obesity, HFrEF, OSA, OHS, AFib-AFlutter who presented with breath shortness, lightheadedness, and palpitations, now admitted for chronic hypoxic respiratory failure and atrial fibrillation.    ---Acute on chronic hypoxic respiratory failure ---Amiodarone toxicity ---Community acquired pneumonia ---Obstructive sleep apnea on nightly BiPAP ---Obesity hypoventilation syndrome Patient presented on 10-28 with breath shortness, found to have atrial flutter rapid ventricular response and hypotension. Of  note patient started amiodarone for atrial fibrillation on 10-26, which was subsequently discontinued upon arrival. Chest radiograph revealed evidence of pulmonary venous congestion and CT showed bilateral consolidations concerning for possible pneumonia. Five-day course of ceftriaxone and doxycycline were completed on 11-6, after which repeat imaging failed to demonstrate any improvement. Volume status has been managed with hemodialysis throughout hospitalization and oxygen requirements have decreased since admission. Inciting event remains unclear, though given lack of improvement with antibiotic therapy, amiodarone toxicity is strong possibility. Course of prednisone was started 11-7 to address the likely inflammatory component, patient reports symptomatic improvement since starting this medication but remains on ~6L/min of oxygen. > Fluticasone-vilanterol 100-25ug q24 > Prednisone 40mg  q24 > Administer BiPAP nightly > Wean oxygen requirements as tolerated, currently 5L [place] > Volume management with hemodialysis > Trend CBC q24   ---End-stage renal disease on hemodialysis Patient missed hemodialysis session on 10-28 prior to ED arrival. Estimated dry weight around 175-180kg, most recently at 183kg. Volume overload likely contributing to respiratory failure, which has been managed with hemodialysis per his usual Tu-Th-Sa schedule. > Nephrology consult, appreciate recommendations > Hemodialysis on Tu-Th-Sa > Torsemide 100mg  on Mo-We-Fr-Su > Trend BMP q24   ---Heart failure with reduced ejection fraction ---Hypotension Echocardiogram performed 02-2022 demonstrated ejection fraction of 20-25% with history negative for an ischemic event. Patient presented with lightheadedness and breath shortness. Upon arrival, troponin levels were slightly elevated and BNP at baseline. Management of heart failure is limited due to ESRD, hypotension requiring midodrine, and morbid obesity status. Acute  heart failure  exacerbation potentially contributing to patient's symptoms and volume status is currently managed with hemodialysis.  > Cardiology consult, appreciate recommendations > Midodrine 5mg  q8, only if SBP>110 > Volume management with hemodialysis on Tu-Th-Sa > Torsemide 100mg  on Mo-We-Fr-Su   ---Atrial flutter post two cardioversions Patient presented on 10-28 with symptomatic tachyarrhythmia and hypotension, found to have atrial flutter plus rapid ventricular response. Atrial flutter initially resolved after cardioversion, but episodes have since returned despite a second attempt. Internal medicine team is holding amiodarone given likely pulmonary toxicity. Given multiple chronic conditions, treatment modalities for his atrial fibrillation are very limited. Cardiology and electrophysiology teams are considering options, possibly high-risk ablation followed by pacemaker. Metoprolol is currently providing rate control. Treatment team will continue to monitor his blood pressure carefully and encourage adherence to midodrine. Third attempt at cardioversion scheduled for Monday 11-13. > Cardiology consult, appreciate recommendations > Metoprolol 25mg  q12 > Apixaban 5mg  q24 > Cardioversion procedure scheduled 11-13   ---Iron deficiency anemia Laboratory testing in 03-2022 consistent with iron deficiency anemia and elevated MCV. Upon arrival, hemoglobin was 8.8 and has been stable since admission. > Check vitamin B12, B9, and TSH > Consider ferrous sulfate supplementation upon discharge     Principal Problem:   Acute hypoxic respiratory failure ([name]) Active Problems:   OSA (obstructive sleep apnea)   Chronic systolic CHF (congestive heart failure) ([name])   Paroxysmal atrial flutter ([name])   Acute respiratory failure with hypoxia ([name])   ESRD (end stage renal disease) on dialysis ([name])   Hypotension   Multiple nodules of lung   Elevated sed rate (elev SR)   Elevated C-reactive protein   Acute on  chronic respiratory failure with hypoxia ([place])    Prior to Admission Living Arrangement: home Anticipated Discharge Location: home Barriers to Discharge: medical management Dispo: Anticipated discharge in approximately 3-4 day(s).    Roswell Nickel, MD Internal Medicine PGY-1 Pager [phone]  After 5pm on weekdays and 1pm on weekends: On Call pager [phone]

## 2022-06-04 NOTE — Progress Notes (Signed)
Subjective: Seen in room sitting up on side of bed.  Asking when going for dialysis.  Objective Vital signs in last 24 hours: Vitals:   06/04/22 0725 06/04/22 0739 06/04/22 0902 06/04/22 1152  BP:      Pulse:      Resp:    (!) 26  Temp:  98.6 F (37 C)  98.1 F (36.7 C)  TempSrc:  Oral  Oral  SpO2: 99%  99%   Weight:      Height:       Weight change:   Physical Exam: General: Obese male chronically ill-appearing, NAD Heart: Irregular irregular, no MRG Lungs: Some decreased breath sounds bilaterally bases and coarse BS on expiration Abdomen: Obese NABS, soft NTND Extremities: Trace bipedal edema Dialysis Access: Right IJ [facility]  Outpatient Dialysis Orders: [facility] TTS 4:15 425/A1.5x 180 kg 3K/2.5Ca TDC  - Heparin: None/HIT Ab POSITIVE - Mircera 120 q 4 wks (last 10/12) - Hectorol 6 TIW, Sensipar 30 TIW  Problem/Plan: 1. Atrial flutter with RVR: S/P cardioversion on 10/28 and then again on 11/3 with discontinuation of amiodarone with CT chest concerning for amiodarone lung. Plans noted for ablation likely this week  2.Acute Hypoxic Resp Failure = Hrt team seeing = Combination /volume overload ?PNA, versus amiodarone lung toxicity-"CT scan chest showed masslike consolidations and clustered areas of nodularity bilaterally multifocal PND or drug toxicity" pneumonia coverage with antibiotics 3 ESRD: On TTS dialysis schedule and was on hemodialysis Tuesday but discontinued after he became more tachycardic after getting more information about his heart/lungs. Had HD early 11/09 AM. Off schedule, will plan for HD to today then resume his regular schedule. Sodium citrate should be ordered for all TDC locks, no heparin.  4 Hypotension:Blood pressure acceptable. Was refusing metoprolol and midodrine due to concern about side effects, now taking them after talking to cardiologist. 5. Anemia: Of CKD low H/H without overt loss, will continue ESA.  6. CKD-MBD: Calcium level at goal with phosphorus  level acceptable on lanthanum.  7. Nutrition: Does not like renal diet. K+ has been controlled, can trial heart healthy diet but he agrees to go back on renal diet if K+/phos worsen. Discussed that he needs to continue fluid restrictions.  8. OSA -continue CPAP nightly  Ernest Haber, PA-C [facility] Beeper [phone] 06/04/2022,12:40 PM  LOS: 12 days   Labs: Basic Metabolic Panel: Recent Labs  Lab 05/29/22 0343 05/30/22 0352 06/01/22 0426 06/02/22 0633 06/03/22 0722 06/04/22 0255  NA 135 133*   < > 135 131* 135  K 4.8 4.1   < > 3.7 3.5 5.2*  CL 92* 95*   < > 98 94* 98  CO2 19* 21*   < > 19* 21* 18*  GLUCOSE 100* 113*   < > 104* 113* 115*  BUN 54* 53*   < > 67* 53* 73*  CREATININE 9.58* 8.68*   < > 11.36* 7.99* 10.05*  CALCIUM 9.4 9.1   < > 8.5* 8.6* 8.7*  PHOS 5.4* 4.6  --   --   --  7.1*   < > = values in this interval not displayed.   Liver Function Tests: Recent Labs  Lab 05/29/22 0343 05/30/22 0352 06/04/22 0255  ALBUMIN 3.2* 3.0* 2.9*   No results for input(s): "LIPASE", "AMYLASE" in the last 168 hours. No results for input(s): "AMMONIA" in the last 168 hours. CBC: Recent Labs  Lab 05/31/22 0414 06/01/22 0426 06/02/22 0633 06/03/22 0722 06/04/22 0255  WBC 12.2* 11.2* 13.0* 15.5*  18.3*  HGB 8.8* 8.2* 8.0* 8.2* 7.9*  HCT 23.6* 23.3* 21.8* 22.2* 22.4*  MCV 103.5* 105.0* 102.8* 102.3* 106.7*  PLT 266 310 328 366 425*   Cardiac Enzymes: No results for input(s): "CKTOTAL", "CKMB", "CKMBINDEX", "TROPONINI" in the last 168 hours. CBG: No results for input(s): "GLUCAP" in the last 168 hours.  Studies/Results: No results found. Medications:  anticoagulant sodium citrate      (feeding supplement) PROSource Plus  30 mL Oral BID BM   apixaban  5 mg Oral BID   Chlorhexidine Gluconate Cloth  6 each Topical Q0600   darbepoetin (ARANESP) injection - DIALYSIS  200 mcg Subcutaneous Q Sat-1800   fluticasone furoate-vilanterol  1 puff Inhalation  Daily   lanthanum  2,000 mg Oral TID WC   metoprolol succinate  25 mg Oral BID   midodrine  5 mg Oral TID WC   predniSONE  40 mg Oral Q breakfast   torsemide  100 mg Oral Once per day on Sun Mon Wed Fri

## 2022-06-05 DIAGNOSIS — J9601 Acute respiratory failure with hypoxia: Secondary | ICD-10-CM | POA: Diagnosis not present

## 2022-06-05 LAB — CBC
HCT: 23.9 % — ABNORMAL LOW (ref 39.0–52.0)
Hemoglobin: 8 g/dL — ABNORMAL LOW (ref 13.0–17.0)
MCH: 34.9 pg — ABNORMAL HIGH (ref 26.0–34.0)
MCHC: 33.5 g/dL (ref 30.0–36.0)
MCV: 104.4 fL — ABNORMAL HIGH (ref 80.0–100.0)
Platelets: 336 10*3/uL (ref 150–400)
RBC: 2.29 MIL/uL — ABNORMAL LOW (ref 4.22–5.81)
RDW: 15.1 % (ref 11.5–15.5)
WBC: 18.7 10*3/uL — ABNORMAL HIGH (ref 4.0–10.5)
nRBC: 0.6 % — ABNORMAL HIGH (ref 0.0–0.2)

## 2022-06-05 LAB — BASIC METABOLIC PANEL
Anion gap: 18 — ABNORMAL HIGH (ref 5–15)
BUN: 59 mg/dL — ABNORMAL HIGH (ref 6–20)
CO2: 21 mmol/L — ABNORMAL LOW (ref 22–32)
Calcium: 8.1 mg/dL — ABNORMAL LOW (ref 8.9–10.3)
Chloride: 95 mmol/L — ABNORMAL LOW (ref 98–111)
Creatinine, Ser: 8.08 mg/dL — ABNORMAL HIGH (ref 0.61–1.24)
GFR, Estimated: 8 mL/min — ABNORMAL LOW (ref >60)
Glucose, Bld: 133 mg/dL — ABNORMAL HIGH (ref 70–99)
Potassium: 4.3 mmol/L (ref 3.5–5.1)
Sodium: 134 mmol/L — ABNORMAL LOW (ref 135–145)

## 2022-06-05 MED ORDER — ONDANSETRON HCL 4 MG/2ML IJ SOLN
4.0000 mg | Freq: Three times a day (TID) | INTRAMUSCULAR | Status: DC | PRN
  Administered 2022-06-05 – 2022-06-07 (×3): 4 mg via INTRAVENOUS
  Filled 2022-06-05 (×4): qty 2

## 2022-06-05 MED ORDER — ANTICOAGULANT SODIUM CITRATE 4% (200MG/5ML) IV SOLN
5.0000 mL | Freq: Once | Status: AC
  Administered 2022-06-05: 5 mL
  Filled 2022-06-05 (×2): qty 5

## 2022-06-05 NOTE — Progress Notes (Signed)
   06/05/22 0810  Assess: MEWS Score  Temp 97.8 F (36.6 C)  BP 123/85  MAP (mmHg) 96  Pulse Rate (!) 134  ECG Heart Rate (!) 133  Resp (!) 26  SpO2 98 %  O2 Device Nasal Cannula  O2 Flow Rate (L/min) 6 L/min  Assess: MEWS Score  MEWS Temp 0  MEWS Systolic 0  MEWS Pulse 3  MEWS RR 2  MEWS LOC 0  MEWS Score 5  MEWS Score Color Red  Assess: if the MEWS score is Yellow or Red  Were vital signs taken at a resting state? Yes  Focused Assessment No change from prior assessment  Does the patient meet 2 or more of the SIRS criteria? Yes  Does the patient have a confirmed or suspected source of infection? Yes  Provider and Rapid Response Notified? No  MEWS guidelines implemented *See Row Information* No, previously red, continue vital signs every 4 hours (MD)  Notify: Charge Nurse/RN  Name of Charge Nurse/RN Notified desiray  Date Charge Nurse/RN Notified 06/05/22  Time Charge Nurse/RN Notified 1607  Notify: Provider  Provider Name/Title Ned Clines DO  Date Provider Notified 06/05/22  Time Provider Notified [phone]  Method of Notification Page  Notification Reason Other (Comment) (red mews)  Provider response No new orders  Date of Provider Response 06/05/22  Time of Provider Response [phone]  Document  Progress note created (see row info) Yes  Assess: SIRS CRITERIA  SIRS Temperature  0  SIRS Pulse 1  SIRS Respirations  1  SIRS WBC 1  SIRS Score Sum  3

## 2022-06-05 NOTE — Progress Notes (Signed)
Subjective: In room sitting up on bed, said tolerated dialysis yesterday, and for dialysis today keep on schedule, currently as he feels stable  Objective Vital signs in last 24 hours: Vitals:   06/04/22 2142 06/04/22 2355 06/05/22 0435 06/05/22 0810  BP:  94/60 100/77 123/85  Pulse: (!) 121 (!) 112 (!) 131 (!) 134  Resp: 20 (!) 24 (!) 26 (!) 26  Temp:  97.9 F (36.6 C) 97.9 F (36.6 C) 97.8 F (36.6 C)  TempSrc:  Axillary Oral Oral  SpO2: 100% 100% 98% 98%  Weight:      Height:       Weight change:   Physical Exam: General: Sitting up on bed, no acute distress Heart: Irregular rhythm ,tacky, currently 110, no MRG appreciated Lungs: Decreased BS bases secondary to body habitus difficult to appreciate, currently unlabored breathing Abdomen: Obese, NABS soft NTND Extremities: Pedal edema 1+ Dialysis Access: IJ [facility]  Outpatient Dialysis Orders: [facility] TTS 4:15 425/A1.5x 180 kg 3K/2.5Ca TDC  - Heparin: None/HIT Ab POSITIVE - Mircera 120 q 4 wks (last 10/12) - Hectorol 6 TIW, Sensipar 30 TIW   Problem/Plan: 1. Atrial flutter with RVR: S/P cardioversion on 10/28 and then again on 11/3 with discontinuation of amiodarone with CT chest concerning for amiodarone lung.  Cardiology//hrt failure team following closely difficult situation./07 prednisone started for possible amiodarone toxicity noted 2.Acute Hypoxic Resp Failure = Hrt team seeing = Combination /volume overload ?PNA, versus amiodarone lung toxicity/OSA-"CT scan chest showed masslike consolidations and clustered areas of nodularity bilaterally multifocal PND or drug toxicity" pneumonia coverage with antibiotics, on nightly CPAP//yesterday HD 2.5  L UF 3 ESRD: On TTS dialysis schedule and was on hemodialysis Tuesday but discontinued after he became more tachycardic after getting more information about his heart/lungs. Had HD early 11/09 AM. Off schedule, had HD yesterday and plan for HD  today to  resume his regular schedule. Sodium  citrate should be ordered for all TDC locks, no heparin.  4 Hypotension:Blood pressure acceptable. Was refusing metoprolol and midodrine due to concern about side effects, now taking them after talking to cardiologist. 5. Anemia: Of CKD Hgb 8.0 without overt loss, will continue ESA.  200 mcg q. Saturday 6. CKD-MBD: Calcium level at goal with phosphorus level 7.1  on lanthanum.  Missing some doses yesterday and  ?  Diet compliance 7. Nutrition: Does not like renal diet. K+ has been controlled, can trial heart healthy diet but he agrees to go back on renal diet if K+/phos worsen. Discussed that he needs to continue fluid restrictions.    Ernest Haber, PA-C [facility] Beeper [phone] 06/05/2022,12:30 PM  LOS: 13 days   Labs: Basic Metabolic Panel: Recent Labs  Lab 05/30/22 0352 06/01/22 0426 06/03/22 0722 06/04/22 0255 06/05/22 0154  NA 133*   < > 131* 135 134*  K 4.1   < > 3.5 5.2* 4.3  CL 95*   < > 94* 98 95*  CO2 21*   < > 21* 18* 21*  GLUCOSE 113*   < > 113* 115* 133*  BUN 53*   < > 53* 73* 59*  CREATININE 8.68*   < > 7.99* 10.05* 8.08*  CALCIUM 9.1   < > 8.6* 8.7* 8.1*  PHOS 4.6  --   --  7.1*  --    < > = values in this interval not displayed.   Liver Function Tests: Recent Labs  Lab 05/30/22 0352 06/04/22 0255  ALBUMIN 3.0* 2.9*   No results  for input(s): "LIPASE", "AMYLASE" in the last 168 hours. No results for input(s): "AMMONIA" in the last 168 hours. CBC: Recent Labs  Lab 06/01/22 0426 06/02/22 0633 06/03/22 0722 06/04/22 0255 06/05/22 0154  WBC 11.2* 13.0* 15.5* 18.3* 18.7*  HGB 8.2* 8.0* 8.2* 7.9* 8.0*  HCT 23.3* 21.8* 22.2* 22.4* 23.9*  MCV 105.0* 102.8* 102.3* 106.7* 104.4*  PLT 310 328 366 425* 336   Cardiac Enzymes: No results for input(s): "CKTOTAL", "CKMB", "CKMBINDEX", "TROPONINI" in the last 168 hours. CBG: No results for input(s): "GLUCAP" in the last 168 hours.  Studies/Results: No results found. Medications:    (feeding supplement) PROSource Plus  30 mL Oral BID BM   apixaban  5 mg Oral BID   Chlorhexidine Gluconate Cloth  6 each Topical Q0600   darbepoetin (ARANESP) injection - DIALYSIS  200 mcg Subcutaneous Q Sat-1800   fluticasone furoate-vilanterol  1 puff Inhalation Daily   lanthanum  2,000 mg Oral TID WC   metoprolol succinate  25 mg Oral BID   midodrine  5 mg Oral TID WC   predniSONE  40 mg Oral Q breakfast   torsemide  100 mg Oral Once per day on Sun Mon Wed Fri

## 2022-06-05 NOTE — Progress Notes (Signed)
Received patient in bed to unit, bipap at bedisde.  Alert and orientedx4 Informed consent signed and in chart.   Treatment initiated: 1356  Treatment completed: 1700   Patient ended tx at 1706 complaining of cramps on the right arm and leg. educated patient that rate could be slow down.but still refuse and wanted to end tx Transported back to the room Alert, without acute distress. Hand-off given to patient's nurse.   Access used: dialysis cath  Access issues: none Dressing: tegaderm with biopatch Total UF removed: 1.9L Medication(s) given: sodium citrate to dwell on dialysis cath 2.1cc/hub  Post HD VS: see table below  Post HD weight: 180.8 kg standing scale.    06/05/22 1724  Vitals  Temp 98.5 F (36.9 C)  Temp Source Oral  BP 101/78  MAP (mmHg) 85  BP Location Right Wrist  BP Method Automatic  Patient Position (if appropriate) Sitting  Pulse Rate (!) 133  Pulse Rate Source Monitor  ECG Heart Rate (!) 133  Resp (!) 35  Oxygen Therapy  SpO2 96 %  O2 Device Nasal Cannula  O2 Flow Rate (L/min) 10 L/min

## 2022-06-05 NOTE — Progress Notes (Signed)
Mobility Specialist Progress Note:   06/05/22 1335  Mobility  Activity Ambulated with assistance in hallway  Level of Assistance Standby assist, set-up cues, supervision of patient - no hands on  Assistive Device None  Distance Ambulated (ft) 100 ft  Activity Response Tolerated well  Mobility Referral Yes  $Mobility charge 1 Mobility   Pt agreeable to mobility session. SpO2 WFL on 8-10LO2. Pt back in room with all needs met.   Nelta Numbers Mobility Specialist Please contact via SecureChat or  Rehab office at [phone]

## 2022-06-05 NOTE — Progress Notes (Signed)
HD#14 SUBJECTIVE:  Patient Summary: Phillip Young is a 36 y.o. with a pertinent PMH of HFrEF, morbid obesity, OSA, OHS, and Afib/flutter, who presented with SOB, lightheadedness, and palpitations and admitted for acute on chronic hypoxic respiratory failure and atrial fibrillation.   Overnight Events: Had nausea overnight, improved with zofran.  Interim History: This is hospital day 14 for this patient who was seen and evaluated at the bedside. He states he is still having dyspnea, especially while he ambulates, causing his O2 sat to drop. He is unable to lay flat due to his dyspnea and feels more comfortable sitting up.   OBJECTIVE:  Vital Signs: Vitals:   06/04/22 1958 06/04/22 2142 06/04/22 2355 06/05/22 0435  BP: (!) 127/97  94/60 100/77  Pulse: (!) 123 (!) 121 (!) 112 (!) 131  Resp: 20 20 (!) 24 (!) 26  Temp: 98.3 F (36.8 C)  97.9 F (36.6 C) 97.9 F (36.6 C)  TempSrc: Oral  Axillary Oral  SpO2: 95% 100% 100% 98%  Weight:      Height:       Supplemental O2: HFNC/BiPAP SpO2: 98 % O2 Flow Rate (L/min): 8 L/min FiO2 (%): 65 %  Filed Weights   06/01/22 0949 06/04/22 1418 06/04/22 1833  Weight: (!) 183.3 kg (!) 184.2 kg (!) 182 kg     Intake/Output Summary (Last 24 hours) at 06/05/2022 0659 Last data filed at 06/05/2022 0435 Gross per 24 hour  Intake 1706 ml  Output 2500 ml  Net -794 ml   Net IO Since Admission: -11,209.94 mL [06/05/22 0659]  Physical Exam: General: Awake and alert, sitting up in bed. No acute distress. CV: Tachycardic rate, irregular rhythm.  Pulmonary: Normal work of breathing while on BiPAP. Decreased breath sounds in bilateral lung bases with no crackles, although difficult to appreciate 2/2 body habitus Abdominal: Soft, nontender, nondistended.  Skin: Warm and dry.  Neuro: A&Ox3. Moves all extremities. No focal deficit. Psych: Normal mood and affect   ASSESSMENT/PLAN:  Assessment: Principal Problem:   Acute hypoxic respiratory  failure ([name]) Active Problems:   OSA (obstructive sleep apnea)   Chronic systolic CHF (congestive heart failure) ([name])   Typical atrial flutter ([name])   Paroxysmal atrial flutter ([name])   Acute respiratory failure with hypoxia ([name])   ESRD (end stage renal disease) on dialysis ([name])   Hypotension   Multiple nodules of lung   Elevated sed rate (elev SR)   Elevated C-reactive protein   Acute on chronic respiratory failure with hypoxia ([name])   Plan: Acute on chronic hypoxic respiratory failure Amiodarone toxicity Community acquired pneumonia, resolved OHS/OSA Acute on chronic respiratory failure remains stable today and no significant improvement noted. Patient continues to wear HFNC (8 L) during the day and notes that he still has episodes of O2 desaturations while ambulating. Wears BiPAP qhs and intermittently during the day without any difficulty.  - Wean O2 requirements as able - Continue prednisone 40 mg daily for amio toxicity - BiPAP qhs - Volume management as per HD - Daily CBC, BMP  Atrial fibrillation/flutter Hypotension  HFrEF Patient has been cardioverted twice this admission and both were successful in converting the patient to NSR temporarily, however, he remains in Afib with HR in the 130s. He is not a candidate for Afib/flutter ablation per cardiology and has not been tolerating rate control very well.  - Cardioversion again scheduled for 11/13 - Continue metoprolol 25 mg bid - Midodrine 5mg  q8h, hold if SPP>110 - Eliquis 5 mg bid  #  ESRD on HD TThS Patient has been on his usual HD schedule while admitted. - Continue HD TThS - Torsemide 100 mg on non-HD days - Daily BMP - Avoid nephrotoxins  Best Practice: Diet: Renal diet IVF: Fluids: none VTE: Eliquis Code: Full AB: none Therapy Recs: None DISPO: Anticipated discharge to Home pending New oxygen and Medical stability .  Signature: Buddy Duty, D.O.  Internal Medicine Resident, PGY-2 [facility]  Internal Medicine Residency  Pager: [phone] 6:59 AM, 06/05/2022   Please contact the on call pager after 5 pm and on weekends at [phone].

## 2022-06-05 NOTE — Progress Notes (Signed)
   Remains tachycardic in atrial flutter with rates in the 130's, despite medication changes. BP stable after decrease in metoprolol. Plan for DCCV on Monday.  Pixie Casino, MD, [facility], [place] Director of the Advanced Lipid Disorders &  Cardiovascular Risk Reduction Clinic Diplomate of the American Board of Clinical Lipidology Attending Cardiologist  Direct Dial: [phone]  Fax: [phone]  Website:  [email or web address]

## 2022-06-05 NOTE — Progress Notes (Signed)
Mobility Specialist Progress Note:   06/05/22 1020  Mobility  Activity Stood at bedside (+ marching)  Level of Assistance Standby assist, set-up cues, supervision of patient - no hands on  Assistive Device None  Activity Response Tolerated fair  Mobility Referral Yes  $Mobility charge 1 Mobility   Pt agreeable to mobility session, however limited by breakfast arriving. Pt would like to eat before ambulation. Pt stood at bedside and marched in place. HR 130s, SpO2 >95% on 8LO2. Will f/u after breakfast for hopeful ambulation.   Nelta Numbers Mobility Specialist Please contact via SecureChat or  Rehab office at [phone]

## 2022-06-06 DIAGNOSIS — J9601 Acute respiratory failure with hypoxia: Secondary | ICD-10-CM | POA: Diagnosis not present

## 2022-06-06 DIAGNOSIS — I5023 Acute on chronic systolic (congestive) heart failure: Secondary | ICD-10-CM | POA: Diagnosis not present

## 2022-06-06 DIAGNOSIS — I483 Typical atrial flutter: Secondary | ICD-10-CM | POA: Diagnosis not present

## 2022-06-06 DIAGNOSIS — Z20822 Contact with and (suspected) exposure to covid-19: Secondary | ICD-10-CM | POA: Diagnosis not present

## 2022-06-06 DIAGNOSIS — J189 Pneumonia, unspecified organism: Secondary | ICD-10-CM | POA: Diagnosis not present

## 2022-06-06 LAB — CBC
HCT: 23.8 % — ABNORMAL LOW (ref 39.0–52.0)
Hemoglobin: 8.2 g/dL — ABNORMAL LOW (ref 13.0–17.0)
MCH: 36.1 pg — ABNORMAL HIGH (ref 26.0–34.0)
MCHC: 34.5 g/dL (ref 30.0–36.0)
MCV: 104.8 fL — ABNORMAL HIGH (ref 80.0–100.0)
Platelets: 292 10*3/uL (ref 150–400)
RBC: 2.27 MIL/uL — ABNORMAL LOW (ref 4.22–5.81)
RDW: 15 % (ref 11.5–15.5)
WBC: 21.5 10*3/uL — ABNORMAL HIGH (ref 4.0–10.5)
nRBC: 1.4 % — ABNORMAL HIGH (ref 0.0–0.2)

## 2022-06-06 LAB — BASIC METABOLIC PANEL
Anion gap: 21 — ABNORMAL HIGH (ref 5–15)
BUN: 65 mg/dL — ABNORMAL HIGH (ref 6–20)
CO2: 18 mmol/L — ABNORMAL LOW (ref 22–32)
Calcium: 7.6 mg/dL — ABNORMAL LOW (ref 8.9–10.3)
Chloride: 92 mmol/L — ABNORMAL LOW (ref 98–111)
Creatinine, Ser: 8.4 mg/dL — ABNORMAL HIGH (ref 0.61–1.24)
GFR, Estimated: 8 mL/min — ABNORMAL LOW (ref >60)
Glucose, Bld: 144 mg/dL — ABNORMAL HIGH (ref 70–99)
Potassium: 4.3 mmol/L (ref 3.5–5.1)
Sodium: 131 mmol/L — ABNORMAL LOW (ref 135–145)

## 2022-06-06 MED ORDER — GUAIFENESIN-DM 100-10 MG/5ML PO SYRP
5.0000 mL | ORAL_SOLUTION | ORAL | Status: DC | PRN
  Administered 2022-06-06: 5 mL via ORAL
  Filled 2022-06-06: qty 5

## 2022-06-06 MED ORDER — MELATONIN 5 MG PO TABS
5.0000 mg | ORAL_TABLET | Freq: Every day | ORAL | Status: DC
  Administered 2022-06-06 – 2022-06-09 (×4): 5 mg via ORAL
  Filled 2022-06-06 (×4): qty 1

## 2022-06-06 MED ORDER — PANTOPRAZOLE SODIUM 40 MG PO TBEC
40.0000 mg | DELAYED_RELEASE_TABLET | Freq: Every day | ORAL | Status: DC
  Administered 2022-06-06 – 2022-06-10 (×5): 40 mg via ORAL
  Filled 2022-06-06 (×5): qty 1

## 2022-06-06 NOTE — Progress Notes (Signed)
Mobility Specialist Progress Note:   06/06/22 1400  Mobility  Activity Ambulated with assistance in hallway  Level of Assistance Standby assist, set-up cues, supervision of patient - no hands on  Assistive Device None  Distance Ambulated (ft) 250 ft  Activity Response Tolerated well  Mobility Referral Yes  $Mobility charge 1 Mobility   Pt eager for mobility session. Required no physical assist, however multiple standing rest breaks taken d/t SOB. SpO2 >90% throughout. Pt states it feels good to walk and that it reassures him he will improve. Left sitting EOB with all needs met.   Nelta Numbers Mobility Specialist Please contact via SecureChat or  Rehab office at [phone]

## 2022-06-06 NOTE — Progress Notes (Signed)
    Remains in 2:1 atrial flutter - HR in the 130's - plan for repeat DCCV attempt tomorrow. Keep NPO p MN.  Pixie Casino, MD, [facility], [place] Director of the Advanced Lipid Disorders &  Cardiovascular Risk Reduction Clinic Diplomate of the American Board of Clinical Lipidology Attending Cardiologist  Direct Dial: [phone]  Fax: [phone]  Website:  [email or web address]

## 2022-06-06 NOTE — Progress Notes (Signed)
Subjective: Seen in room, CPAP on, currently has no complaints said tolerated dialysis yesterday on schedule.  Noted plans for 06/07/22 DCCV per cardiology  Objective Vital signs in last 24 hours: Vitals:   06/05/22 1727 06/05/22 1918 06/05/22 1929 06/06/22 0900  BP:  (!) 131/92  111/81  Pulse:  (!) 130 (!) 130 (!) 134  Resp:  (!) 30 (!) 29 (!) 24  Temp:    98.1 F (36.7 C)  TempSrc:    Axillary  SpO2:  96% 96% 100%  Weight: (!) 180.8 kg     Height:       Weight change: -1.1 kg   Physical Exam: General: Alert NAD  Heart: Irregular rhythm ,tacky, no MRG appreciated Lungs: Decreased BS bases secondary to body habitus difficult to appreciate, currently unlabored breathing Abdomen: Obese, NABS soft NTND Extremities: No pedal edema Dialysis Access: IJ [facility]   Outpatient Dialysis Orders: [facility] TTS 4:15 425/A1.5x 180 kg 3K/2.5Ca TDC  - Heparin: None/HIT Ab POSITIVE - Mircera 120 q 4 wks (last 10/12) - Hectorol 6 TIW, Sensipar 30 TIW   Problem/Plan: 1. Atrial flutter with RVR: S/P cardioversion on 10/28 and then again on 11/3 with discontinuation of amiodarone with CT chest concerning for amiodarone lung.  Cardiology//hrt failure team following closely difficult situation./07 prednisone started for possible amiodarone toxicity noted.  On apixaban and metoprolol XL 25 mg twice daily noted plans for 06/07/22 DCCV per cardiology 2.Acute Hypoxic Resp Failure = Hrt team seeing = Combination /volume overload ?PNA, versus amiodarone lung toxicity/OSA-"CT scan chest showed masslike consolidations and clustered areas of nodularity bilaterally multifocal PND or drug toxicity" pneumonia coverage with antibiotics, on nightly CPAP//yesterday HD 2.5  L UF 3 ESRD: On TTS dialysis schedule.  And on schedule.   K4.3. Sodium citrate should be ordered for all TDC locks, no heparin.  4 Hypotension:Blood pressure acceptable.  This a.m. 114/64, 111/81.  Recent week was refusing metoprolol and midodrine due to  concern about side effects, now taking them after talking to cardiologist. 5. Anemia: Of CKD Hgb 8.2 <8.0 without overt loss, on Aranesp 200 mcg q. Saturday 6. CKD-MBD: Calcium level at goal with phosphorus level 7.1  on lanthanum.  Missing some doses and  ?  Diet compliance 7. Nutrition: Does not like renal diet. K+ has been controlled, can trial heart healthy diet but he agrees to go back on renal diet if K+/phos worsen. Discussed that he needs to continue fluid restrictions.   Ernest Haber, PA-C [facility], [facility] Beeper [phone] 06/06/2022,9:17 AM  LOS: 14 days   Labs: Basic Metabolic Panel: Recent Labs  Lab 06/04/22 0255 06/05/22 0154 06/06/22 0313  NA 135 134* 131*  K 5.2* 4.3 4.3  CL 98 95* 92*  CO2 18* 21* 18*  GLUCOSE 115* 133* 144*  BUN 73* 59* 65*  CREATININE 10.05* 8.08* 8.40*  CALCIUM 8.7* 8.1* 7.6*  PHOS 7.1*  --   --    Liver Function Tests: Recent Labs  Lab 06/04/22 0255  ALBUMIN 2.9*   No results for input(s): "LIPASE", "AMYLASE" in the last 168 hours. No results for input(s): "AMMONIA" in the last 168 hours. CBC: Recent Labs  Lab 06/02/22 0633 06/03/22 0722 06/04/22 0255 06/05/22 0154 06/06/22 0313  WBC 13.0* 15.5* 18.3* 18.7* 21.5*  HGB 8.0* 8.2* 7.9* 8.0* 8.2*  HCT 21.8* 22.2* 22.4* 23.9* 23.8*  MCV 102.8* 102.3* 106.7* 104.4* 104.8*  PLT 328 366 425* 336 292   Cardiac Enzymes: No results for input(s): "CKTOTAL", "CKMB", "CKMBINDEX", "  TROPONINI" in the last 168 hours. CBG: No results for input(s): "GLUCAP" in the last 168 hours.  Studies/Results: No results found. Medications:   (feeding supplement) PROSource Plus  30 mL Oral BID BM   apixaban  5 mg Oral BID   Chlorhexidine Gluconate Cloth  6 each Topical Q0600   darbepoetin (ARANESP) injection - DIALYSIS  200 mcg Subcutaneous Q Sat-1800   fluticasone furoate-vilanterol  1 puff Inhalation Daily   lanthanum  2,000 mg Oral TID WC   metoprolol succinate  25 mg Oral BID    midodrine  5 mg Oral TID WC   pantoprazole  40 mg Oral Daily   predniSONE  40 mg Oral Q breakfast   torsemide  100 mg Oral Once per day on Sun Mon Wed Fri

## 2022-06-06 NOTE — Progress Notes (Signed)
HD#14 SUBJECTIVE:  Patient Summary: Phillip Young is a 36 y.o. with a pertinent PMH of HFrEF, morbid obesity, OSA, OHS, and Afib/flutter, who presented with SOB, lightheadedness, and palpitations and admitted for acute on chronic hypoxic respiratory failure and atrial fibrillation/atrial flutter.   Overnight Events: Early this AM, patient had some chest discomfort. He states it was an aching pain in the middle of his chest that did not radiate. His heart rate was also noted to be elevated. An EKG was obtained which showed atrial flutter. There was also concern that he may have some ST changes. The pain did resolve without any intervention. Cardiology was called and asked for repeat EKG. During the attempted repeat EKG he coughed and feels like he syncopized. He quickly returned to baseline.   Interim History: This AM patient states he has had some dyspnea but this is chronic for him. He has not had any further episodes of chest pain since the event earlier in the AM.   OBJECTIVE:  Vital Signs: Vitals:   06/05/22 1724 06/05/22 1727 06/05/22 1918 06/05/22 1929  BP: 101/78  (!) 131/92   Pulse: (!) 133  (!) 130 (!) 130  Resp: (!) 35  (!) 30 (!) 29  Temp: 98.5 F (36.9 C)     TempSrc: Oral     SpO2: 96%  96% 96%  Weight:  (!) 180.8 kg    Height:       Supplemental O2:  Bipap SpO2: 96 % O2 Flow Rate (L/min): 8 L/min FiO2 (%): 55 %  Filed Weights   06/04/22 1833 06/05/22 1321 06/05/22 1727  Weight: (!) 182 kg (!) 183.1 kg (!) 180.8 kg     Intake/Output Summary (Last 24 hours) at 06/06/2022 0602 Last data filed at 06/05/2022 1700 Gross per 24 hour  Intake --  Output 1.9 ml  Net -1.9 ml   Net IO Since Admission: -11,211.84 mL [06/06/22 0602]  Physical Exam: Physical Exam Constitutional:      Appearance: He is obese. He is not ill-appearing or toxic-appearing.  Cardiovascular:     Rate and Rhythm: Regular rhythm. Tachycardia present.     Heart sounds: No murmur  heard. Pulmonary:     Effort: Pulmonary effort is normal. No tachypnea or bradypnea.     Breath sounds: No wheezing, rhonchi or rales.  Chest:     Chest wall: No tenderness.  Abdominal:     Palpations: Abdomen is soft.     Tenderness: There is no abdominal tenderness. There is no rebound.  Musculoskeletal:     Right lower leg: No edema.     Left lower leg: No edema.  Skin:    General: Skin is warm and dry.  Neurological:     General: No focal deficit present.     Mental Status: He is alert.     Patient Lines/Drains/Airways Status     Active Line/Drains/Airways     Name Placement date Placement time Site Days   Peripheral IV 05/29/22 20 G 1.88" Left;Posterior;Lateral Forearm 05/29/22  1627  Forearm  8   Hemodialysis Catheter Right Internal jugular Double lumen Permanent (Tunneled) 08/20/21  1324  Internal jugular  290             ASSESSMENT/PLAN:  Assessment: Principal Problem:   Acute hypoxic respiratory failure ([name]) Active Problems:   OSA (obstructive sleep apnea)   Chronic systolic CHF (congestive heart failure) ([name])   Typical atrial flutter ([name])   Paroxysmal atrial flutter ([name])   Acute  respiratory failure with hypoxia ([name])   ESRD (end stage renal disease) on dialysis ([name])   Hypotension   Multiple nodules of lung   Elevated sed rate (elev SR)   Elevated C-reactive protein   Acute on chronic respiratory failure with hypoxia ([name])   Plan: Acute on chronic hypoxic respiratory failure Amiodarone toxicity Community acquired pneumonia, resolved OHS/OSA Acute on chronic respiratory failure remains stable today and no significant improvement noted. Patient continues to wear HFNC (8 L) during the day and notes that he still has episodes of O2 desaturations while ambulating. Wears BiPAP qhs and intermittently during the day without any difficulty.  - Wean O2 requirements as able - Continue prednisone 40 mg daily for amio toxicity - BiPAP qhs - Volume  management as per HD - Daily CBC, BMP  #?Syncopal episode  #Chest pain Patient notes that he had a coughing event. This likely caused vasovagal syncope. He is back at his baseline. Unclear cause of patient's chest pain earlier this AM. Low suspicion for ACS or PE. It has completely resolved without intervention.  -Continue to monitor   Atrial fibrillation/flutter Hypotension  HFrEF Patient has been cardioverted twice this admission and both were successful in converting the patient to NSR temporarily, He remains in 2:1 aflutter. He is not a candidate for Afib/flutter ablation per cardiology and has not been tolerating rate control very well.  - Cardioversion again scheduled for 11/13 - Continue metoprolol 25 mg bid - Midodrine 5mg  q8h, hold if SBP>110 - Eliquis 5 mg bid   #ESRD on HD TThS Patient has been on his usual HD schedule while admitted. S/p HD yesterday with no removal of fluid.  - Continue HD TThS - Torsemide 100 mg on non-HD days - Daily BMP - Avoid nephrotoxins   Best Practice: Diet: Renal diet IVF: Fluids: none VTE: Eliquis Code: Full AB: none Therapy Recs: None DISPO: Anticipated discharge to Home pending New oxygen and Medical stability .  Signature: Rick Duff, MD PGY-3 Internal Medicine  Pager [phone]  6:02 AM, 06/06/2022   Please contact the on call pager after 5 pm and on weekends at [phone].

## 2022-06-07 ENCOUNTER — Encounter (HOSPITAL_COMMUNITY)
Admission: EM | Disposition: A | Discharge status: Home / Self Care | Payer: Self-pay | Source: Home / Self Care | Attending: Internal Medicine

## 2022-06-07 ENCOUNTER — Inpatient Hospital Stay (HOSPITAL_COMMUNITY): Payer: BC Managed Care – PPO | Admitting: Certified Registered"

## 2022-06-07 ENCOUNTER — Inpatient Hospital Stay (HOSPITAL_COMMUNITY): Payer: BC Managed Care – PPO

## 2022-06-07 DIAGNOSIS — I484 Atypical atrial flutter: Secondary | ICD-10-CM

## 2022-06-07 DIAGNOSIS — J189 Pneumonia, unspecified organism: Secondary | ICD-10-CM | POA: Diagnosis not present

## 2022-06-07 DIAGNOSIS — Z20822 Contact with and (suspected) exposure to covid-19: Secondary | ICD-10-CM | POA: Diagnosis not present

## 2022-06-07 DIAGNOSIS — I5023 Acute on chronic systolic (congestive) heart failure: Secondary | ICD-10-CM | POA: Diagnosis not present

## 2022-06-07 DIAGNOSIS — J9601 Acute respiratory failure with hypoxia: Secondary | ICD-10-CM | POA: Diagnosis not present

## 2022-06-07 DIAGNOSIS — I483 Typical atrial flutter: Secondary | ICD-10-CM | POA: Diagnosis not present

## 2022-06-07 HISTORY — PX: CARDIOVERSION: SHX1299

## 2022-06-07 LAB — BLOOD GAS, VENOUS
Acid-base deficit: 3.4 mmol/L — ABNORMAL HIGH (ref 0.0–2.0)
Bicarbonate: 21.4 mmol/L (ref 20.0–28.0)
O2 Saturation: 71.5 %
Patient temperature: 36.6
pCO2, Ven: 36 mmHg — ABNORMAL LOW (ref 44–60)
pH, Ven: 7.38 (ref 7.25–7.43)
pO2, Ven: 48 mmHg — ABNORMAL HIGH (ref 32–45)

## 2022-06-07 LAB — PROTIME-INR
INR: 2.9 — ABNORMAL HIGH (ref 0.8–1.2)
Prothrombin Time: 29.7 seconds — ABNORMAL HIGH (ref 11.4–15.2)

## 2022-06-07 LAB — POCT I-STAT, CHEM 8
BUN: 61 mg/dL — ABNORMAL HIGH (ref 6–20)
Calcium, Ion: 0.86 mmol/L — CL (ref 1.15–1.40)
Chloride: 94 mmol/L — ABNORMAL LOW (ref 98–111)
Creatinine, Ser: 7.4 mg/dL — ABNORMAL HIGH (ref 0.61–1.24)
Glucose, Bld: 95 mg/dL (ref 70–99)
HCT: 29 % — ABNORMAL LOW (ref 39.0–52.0)
Hemoglobin: 9.9 g/dL — ABNORMAL LOW (ref 13.0–17.0)
Potassium: 5.1 mmol/L (ref 3.5–5.1)
Sodium: 131 mmol/L — ABNORMAL LOW (ref 135–145)
TCO2: 25 mmol/L (ref 22–32)

## 2022-06-07 SURGERY — CARDIOVERSION
Anesthesia: General

## 2022-06-07 MED ORDER — KETAMINE HCL 50 MG/5ML IJ SOSY
PREFILLED_SYRINGE | INTRAMUSCULAR | Status: AC
  Filled 2022-06-07: qty 5

## 2022-06-07 MED ORDER — ALTEPLASE 2 MG IJ SOLR: 2.0000 mg | Freq: Once | INTRAMUSCULAR | Status: DC | PRN

## 2022-06-07 MED ORDER — LIDOCAINE 2% (20 MG/ML) 5 ML SYRINGE
INTRAMUSCULAR | Status: DC | PRN
  Administered 2022-06-07: 100 mg via INTRAVENOUS

## 2022-06-07 MED ORDER — MIDAZOLAM HCL (PF) 5 MG/ML IJ SOLN
INTRAMUSCULAR | Status: DC | PRN
  Administered 2022-06-07: 4 mg via INTRAVENOUS

## 2022-06-07 MED ORDER — KETAMINE HCL 10 MG/ML IJ SOLN
INTRAMUSCULAR | Status: DC | PRN
  Administered 2022-06-07: 40 mg via INTRAVENOUS

## 2022-06-07 MED ORDER — SODIUM CHLORIDE 0.9 % IV SOLN: INTRAVENOUS | Status: DC

## 2022-06-07 MED ORDER — LIDOCAINE-PRILOCAINE 2.5-2.5 % EX CREA
1.0000 | TOPICAL_CREAM | CUTANEOUS | Status: DC | PRN
  Filled 2022-06-07: qty 5

## 2022-06-07 MED ORDER — LIDOCAINE HCL (PF) 1 % IJ SOLN
5.0000 mL | INTRAMUSCULAR | Status: DC | PRN
  Filled 2022-06-07: qty 5

## 2022-06-07 MED ORDER — ANTICOAGULANT SODIUM CITRATE 4% (200MG/5ML) IV SOLN
5.0000 mL | Status: DC | PRN
  Filled 2022-06-07 (×3): qty 5

## 2022-06-07 MED ORDER — PENTAFLUOROPROP-TETRAFLUOROETH EX AERO: 1.0000 | INHALATION_SPRAY | CUTANEOUS | Status: DC | PRN

## 2022-06-07 MED ORDER — MIDAZOLAM HCL (PF) 5 MG/ML IJ SOLN
INTRAMUSCULAR | Status: AC
  Filled 2022-06-07: qty 1

## 2022-06-07 NOTE — Progress Notes (Signed)
Patient ID: Phillip Young, male   DOB: 1986/03/22, 36 y.o.   MRN: [id number]     Advanced Heart Failure Rounding Note  PCP-Cardiologist: [name], MD   Subjective:    Admitted w/ AFL w/ RVR + acute hypoxic respiratory failure due to PNA vs amiodarone lung toxicity.   CT chest: mass-like consolidations and clustered areas of nodularity bilaterally, multifocal PNA or drug toxicity.   10/28: emergent DCCV in ED>>NSR 11/2: reverted back to AFL w/ RVR,. PO amio discontinued. Started metoprolol 12.80m q6h. Abx started for PNA 11/3: DCCV to NSR 11/6: Back in AFL. Completed PNA coverage with ceftriaxone/doxycycline. Repeat ESR higher 113>>135  11/7: Started prednisone for possible amiodarone toxicity  This morning, he remains in atrial flutter rate around 130.  He is very short of breath and orthopneic this morning, feels "terrible." Had last HD on Saturday.   Objective:   Weight Range: (!) 180.8 kg Body mass index is 54.06 kg/m.   Vital Signs:   Temp:  [97.6 F (36.4 C)-98.1 F (36.7 C)] 98 F (36.7 C) (11/12 2040) Pulse Rate:  [131-134] 131 (11/12 2040) Resp:  [24] 24 (11/12 2040) BP: (107-123)/(71-87) 123/82 (11/12 2040) SpO2:  [98 %-100 %] 98 % (11/12 2040) Last BM Date : 06/05/22  Weight change: Filed Weights   06/04/22 1833 06/05/22 1321 06/05/22 1727  Weight: (!) 182 kg (!) 183.1 kg (!) 180.8 kg    Intake/Output:  No intake or output data in the 24 hours ending 06/07/22 0815     Physical Exam    General: Uncomfortable, sitting up in chair.  Neck: Thick, JVP 14+, no thyromegaly or thyroid nodule.  Lungs: Decreased BS at bases.  CV: Nondisplaced PMI.  Heart tachy, irregular S1/S2, no S3/S4, no murmur.  Trace ankle edema.  Abdomen: Soft, nontender, no hepatosplenomegaly, no distention.  Skin: Intact without lesions or rashes.  Neurologic: Alert and oriented x 3.  Psych: Normal affect. Extremities: No clubbing or cyanosis.  HEENT: Normal.    Telemetry    Atrial flutter 130 (personally reviewed)  Labs    CBC Recent Labs    06/05/22 0154 06/06/22 0313  WBC 18.7* 21.5*  HGB 8.0* 8.2*  HCT 23.9* 23.8*  MCV 104.4* 104.8*  PLT 336 2676  Basic Metabolic Panel Recent Labs    06/05/22 0154 06/06/22 0313  NA 134* 131*  K 4.3 4.3  CL 95* 92*  CO2 21* 18*  GLUCOSE 133* 144*  BUN 59* 65*  CREATININE 8.08* 8.40*  CALCIUM 8.1* 7.6*   Liver Function Tests No results for input(s): "AST", "ALT", "ALKPHOS", "BILITOT", "PROT", "ALBUMIN" in the last 72 hours.   No results for input(s): "LIPASE", "AMYLASE" in the last 72 hours. Cardiac Enzymes No results for input(s): "CKTOTAL", "CKMB", "CKMBINDEX", "TROPONINI" in the last 72 hours.  BNP: BNP (last 3 results) Recent Labs    02/18/22 0154 03/26/22 1958 05/22/22 0732  BNP 373.8* 374.2* 373.1*    ProBNP (last 3 results) No results for input(s): "PROBNP" in the last 8760 hours.   D-Dimer No results for input(s): "DDIMER" in the last 72 hours. Hemoglobin A1C No results for input(s): "HGBA1C" in the last 72 hours. Fasting Lipid Panel No results for input(s): "CHOL", "HDL", "LDLCALC", "TRIG", "CHOLHDL", "LDLDIRECT" in the last 72 hours. Thyroid Function Tests Recent Labs    06/04/22 1924  TSH 3.261     Other results:   Imaging    No results found.   Medications:     Scheduled Medications:  (  feeding supplement) PROSource Plus  30 mL Oral BID BM   apixaban  5 mg Oral BID   Chlorhexidine Gluconate Cloth  6 each Topical Q0600   darbepoetin (ARANESP) injection - DIALYSIS  200 mcg Subcutaneous Q Sat-1800   fluticasone furoate-vilanterol  1 puff Inhalation Daily   lanthanum  2,000 mg Oral TID WC   melatonin  5 mg Oral QHS   metoprolol succinate  25 mg Oral BID   midodrine  5 mg Oral TID WC   pantoprazole  40 mg Oral Daily   predniSONE  40 mg Oral Q breakfast   torsemide  100 mg Oral Once per day on Sun Mon Wed Fri    Infusions:    PRN  Medications: acetaminophen **OR** acetaminophen, guaiFENesin-dextromethorphan, ondansetron, polyethylene glycol, senna-docusate    Patient Profile   36 y/o male w/ chronic biventricular heart failure due to NICM, PAF/AFL s/p multiple DCCVs, multiple intolerances to amiodarone, ESRD on iHD, OSA on CPAP and morbid obesity, admitted for acute hypoxic respiratory failure and hypotension in the setting of recurrent ALF w/ RVR, requiring emergent DCCV in ED. Now w/ concern for potential amiodarone pulmonary toxicity. Also being treated for possible PNA.   Assessment/Plan   1. Atrial flutter/fibrillation: History of multiple DCCVs. Amiodarone previously stopped due to elevated LFTs. Restarted 05/20/22 by Dr. Curt Bears.  Admitted w/ hemodynamically unstable AFL w/ RVR requiring emergent DCCV>>NSR in ER.  Atrial flutter recurred with RVR post-DCCV.  Now w/ concern for possible amiodarone pulmonary toxicity w/ elevated ESR 113 and abnormal high resolution chest CT showing mass-like consolidations and clustered areas of nodularity bilaterally, multifocal PNA or drug toxicity. Amiodarone stopped (he says breathing got worse after restarting on amiodarone).  DCCV twice this admission but fibrillation and flutter have recurred.  I reviewed ECGs with EP, he has been in BOTH atrial flutter (typical and atypical) and fibrillation this admission.  Today, he is in atrial flutter. We have been struggling to rate control him.  - Per Dr. Curt Bears, he is not a candidate for atrial flutter or fibrillation ablation.  Has multiple flutter circuits and also has had fibrillation this admission.  Would not ablate flutter alone without treating atrial fibrillation, and he would not be a candidate for atrial fibrillation ablation.  - Continue Toprol XL 25 mg bid, has not been able to tolerate higher doses.  - Continue midodrine 5 mg tid to maintain BP.   - Poor candidate for AV nodal ablation with BiV pacing due to ESRD and infection  risk, EP has evaluated and deemed not a candidate.  - Not candidate for amiodarone (lung and hepatic toxicity) or Tikosyn (ESRD), no anti-arrhythmic option.  - Will try to cardiovert one last time today (if we can get him to HD this morning and improve his breathing/orthopnea). He has had 2 DCCVs this admission that did not hold.  - Continue Eliquis 5 mg bid  2.  Chronic systolic CHF: Nonischemic CMP, LHC 2021 with no CAD. Previous hospitalizations w/shock in setting of rapid AFib/Flutter requiring milrinone. Most recent echo 8/23 with EF 20-25%, RV mildly reduced. ESRD, volume controlled by HD.  This morning, he is very short of breath and orthopneic, +volume overload on exam.  - Needs HD this morning, appears quite uncomfortable.  I spoke with nephrology about this.  - GDMT limited by CKD and hypotension requiring midodrine  - Not candidate for ICD given obesity and dialysis  - Not candidate for advanced therapies.  3. Acute Hypoxic Respiratory Failure:  Combination of volume overload and multifocal PNA.  High resolution CT chest showed mass-like consolidations and clustered areas of nodularity bilaterally, multifocal PNA or drug toxicity. Unclear if lung opacifications are infectious or inflammatory. Repeat ESR higher 113>>135. Suspect amiodarone lung toxicity, pulmonary following.  He has completed a course of abx with no improvement in CT chest or inflammatory markers.  - Amiodarone has been stopped - Prednisone started for suspected inflammatory lung disease from amiodarone lung toxicity, will followup as outpatient with pulmonary. Does feel improvement with prednisone.  - Still on 8L [place].  4. ESRD: HD per nephrology, on T,Th,Sat schedule.  Volume overloaded and uncomfortable today.  - Needs HD this morning.  5. Obesity: Body mass index is 55.64 kg/m.  - wt loss advised 6. Fe deficiency anemia.  - T-sat < 20%. Received Ferrlecit per primary team  7. OSA: Continue CPAP QHS  Difficult  situation as outlined above with very limited options. Will reattempt cardioversion hopefully later today if we can get some fluid off with HD.   Length of Stay: [place] 06/07/2022 8:15 AM

## 2022-06-07 NOTE — Procedures (Signed)
Electrical Cardioversion Procedure Note Phillip Young [id number] 18-May-1986  Procedure: Electrical Cardioversion Indications:  Atrial Flutter  Procedure Details Consent: Risks of procedure as well as the alternatives and risks of each were explained to the (patient/caregiver).  Consent for procedure obtained. Time Out: Verified patient identification, verified procedure, site/side was marked, verified correct patient position, special equipment/implants available, medications/allergies/relevent history reviewed, required imaging and test results available.  Performed  Patient placed on cardiac monitor, pulse oximetry, supplemental oxygen as necessary.  Sedation given:  Ketamine per anesthesiology Pacer pads placed anterior and posterior chest.  Cardioverted 1 time(s).  Cardioverted at [place].  Evaluation Findings: Post procedure EKG shows: NSR Complications: None Patient did tolerate procedure well.   [name] 06/07/2022, 3:06 PM

## 2022-06-07 NOTE — Progress Notes (Addendum)
Subjective:  Patient feeling uncomfortable this morning, breathing labored on high-flow nasal cannula and believes that he is volume overloaded. Discussed plan for hemodialysis later today, which should help remove some fluid. Encouraged adherence to medication regimen.    Objective: Vitals over previous 24hr: Vitals:   06/07/22 1049 06/07/22 1130 06/07/22 1155 06/07/22 1225  BP: (!) 163/120 (!) 140/111 100/76 100/74  Pulse: (!) 36 (!) 127 (!) 128 (!) 127  Resp: (!) 24 (!) 25 (!) 24 (!) 37  Temp: 98.5 F (36.9 C)     TempSrc: Oral     SpO2: 92% 100% 100% 95%  Weight:      Height:        General:      awake and alert, sitting uncomfortably in chair, cooperative, not in acute distress Skin:       warm and dry, intact without any obvious lesions or scars, no rashes Head:      normocephalic and atraumatic, oral mucosa moist Lungs:      increased respiratory effort, breathing slightly labored, symmetrical chest rise, mildly decreased sounds in lower fields but generally difficult to appreciate given body habitus, no crackles or wheezing Cardiac:      tachycardic with irregular rate, normal S1 and S2, no murmurs Neurologic:      oriented to person-place-time, moving all extremities, no gross focal deficits Psychiatric:      euthymic mood with appropriate affect, intelligible speech    Assessment/Plan: Phillip Young is a 36 year old male with a past medical history of morbid obesity, HFrEF, OSA, OHS, AFib-AFlutter who presented with breath shortness, lightheadedness, and palpitations, now admitted for chronic hypoxic respiratory failure and atrial fibrillation.    ---Acute on chronic hypoxic respiratory failure ---Amiodarone toxicity ---Community acquired pneumonia ---Obstructive sleep apnea on nightly BiPAP ---Obesity hypoventilation syndrome Patient presented on 10-28 with breath shortness, found to have atrial flutter rapid ventricular response and hypotension. Of note  patient started amiodarone for atrial fibrillation on 10-26, which was subsequently discontinued upon arrival. Chest radiograph revealed evidence of pulmonary venous congestion and CT showed bilateral consolidations concerning for possible pneumonia. Five-day course of ceftriaxone and doxycycline were completed on 11-6, after which repeat imaging failed to demonstrate any improvement. Volume status has been managed with hemodialysis throughout hospitalization and oxygen requirements have fluctuated since admission. Inciting event remains unclear, though given lack of improvement with antibiotic therapy, amiodarone toxicity is strong possibility. Course of prednisone was started 11-7 to address the likely inflammatory component, patient reports symptomatic improvement since starting this medication but remains on ~6-8L/min of oxygen. Chest radiograph and venous blood gas ordered on 11-13 for increased work of breathing. > Fluticasone-vilanterol 100-25ug q24 > Prednisone 40mg  q24 > Chest radiograph > Venous blood gas > Administer BiPAP nightly > Wean oxygen requirements as tolerated, currently 8L [place] > Volume management with hemodialysis > Trend CBC q24   ---End-stage renal disease on hemodialysis Patient missed hemodialysis session on 10-28 prior to ED arrival. Estimated dry weight around 175-180kg, most recently at 181kg. Volume overload likely contributing to respiratory failure, which has been managed with hemodialysis per his usual Tu-Th-Sa schedule. Additional sessions were completed on 11-10 and 11-13 for better volume control. > Nephrology consult, appreciate recommendations > Hemodialysis on Tu-Th-Sa > Torsemide 100mg  on Mo-We-Fr-Su > Trend BMP q24   ---Heart failure with reduced ejection fraction ---Hypotension Echocardiogram performed 02-2022 demonstrated ejection fraction of 20-25% with history negative for an ischemic event. Patient presented with lightheadedness and breath shortness.  Upon arrival, troponin  levels were slightly elevated and BNP at baseline. Management of heart failure is limited due to ESRD, hypotension requiring midodrine, and morbid obesity status. Acute heart failure exacerbation potentially contributing to patient's symptoms and volume status is currently managed with hemodialysis including two extra sessions. > Cardiology consult, appreciate recommendations > Midodrine 5mg  q8, only if SBP>110 > Volume management with hemodialysis on Tu-Th-Sa > Torsemide 100mg  on Mo-We-Fr-Su   ---Atrial flutter post two cardioversions Patient presented on 10-28 with symptomatic tachyarrhythmia and hypotension, found to have atrial flutter plus rapid ventricular response. Atrial flutter initially resolved after cardioversion, but episodes have since returned despite another attempt. Internal medicine team is holding amiodarone given likely pulmonary toxicity. Given multiple chronic conditions, treatment modalities for his atrial fibrillation are very limited. Cardiology and electrophysiology teams are considering all potential options. Metoprolol is currently providing rate control, albeit poorly. Treatment team will continue to monitor his blood pressure carefully and encourage adherence to midodrine. Third attempt at cardioversion scheduled for later today 11-13 after hemodialysis. > Cardiology consult, appreciate recommendations > Metoprolol 25mg  q12 > Apixaban 5mg  q24 > Cardioversion procedure later today 11-13   ---Iron deficiency anemia Laboratory testing in 03-2022 consistent with iron deficiency anemia and elevated MCV. Upon arrival, hemoglobin was 8.8 and has been stable since admission. Vitamin B9 and TSH levels were normal, B12 markedly elevated. > Consider ferrous sulfate supplementation upon discharge     Principal Problem:   Acute hypoxic respiratory failure ([name]) Active Problems:   OSA (obstructive sleep apnea)   Chronic systolic CHF (congestive heart  failure) ([name])   Typical atrial flutter ([name])   Paroxysmal atrial flutter ([name])   Acute respiratory failure with hypoxia ([name])   ESRD (end stage renal disease) on dialysis ([name])   Hypotension   Multiple nodules of lung   Elevated sed rate (elev SR)   Elevated C-reactive protein   Acute on chronic respiratory failure with hypoxia ([place])    Prior to Admission Living Arrangement: home Anticipated Discharge Location: home Barriers to Discharge: medical management Dispo: Anticipated discharge in approximately 3-4 day(s).    Roswell Nickel, MD Internal Medicine PGY-1 Pager [phone]  After 5pm on weekdays and 1pm on weekends: On Call pager [phone]

## 2022-06-07 NOTE — Progress Notes (Signed)
Visited patient this evening after hemodialysis session and cardioversion procedure. He was sitting upright comfortably on the side of his bed. Breathing was improved significantly compared to this morning with SpO2 values around 100% on 6L/min of oxygen. Vital signs demonstrating only mild tachycardia. Subjectively, he feels much better and is grateful for having underwent treatment today. Nursing staff was instructed to hold torsemide dose given the urgent hemodialysis session.

## 2022-06-07 NOTE — Anesthesia Procedure Notes (Signed)
Procedure Name: General with mask airway Date/Time: 06/07/2022 3:11 PM  Performed by: Valda Favia, CRNAPre-anesthesia Checklist: Patient identified, Emergency Drugs available, Suction available, Patient being monitored and Timeout performed Patient Re-evaluated:Patient Re-evaluated prior to induction Oxygen Delivery Method: Ambu bag and Nasal cannula Preoxygenation: Pre-oxygenation with 100% oxygen Induction Type: IV induction Dental Injury: Teeth and Oropharynx as per pre-operative assessment

## 2022-06-07 NOTE — Anesthesia Preprocedure Evaluation (Signed)
Anesthesia Evaluation  Patient identified by MRN, date of birth, ID band Patient awake    Reviewed: Allergy & Precautions, NPO status , Patient's Chart, lab work & pertinent test results  Airway Mallampati: IV  TM Distance: >3 FB Neck ROM: Full    Dental  (+) Dental Advisory Given, Poor Dentition, Missing   Pulmonary shortness of breath, sleep apnea , former smoker   Pulmonary exam normal breath sounds clear to auscultation       Cardiovascular hypertension, +CHF  + dysrhythmias Atrial Fibrillation  Rhythm:Irregular Rate:Abnormal  TTE 2022 1. Left ventricular ejection fraction, by estimation, is 25 to 30%. The  left ventricle has severely decreased function. The left ventricle  demonstrates global hypokinesis. The left ventricular internal cavity size  was moderately dilated. Left  ventricular diastolic parameters are indeterminate.  2. Right ventricular systolic function is normal. The right ventricular  size is normal. There is moderately elevated pulmonary artery systolic  pressure. The estimated right ventricular systolic pressure is 01.7 mmHg.  3. The mitral valve is normal in structure. Trivial mitral valve  regurgitation. No evidence of mitral stenosis.  4. The aortic valve is normal in structure. Aortic valve regurgitation is  not visualized. No aortic stenosis is present.  5. The inferior vena cava is normal in size with greater than 50%  respiratory variability, suggesting right atrial pressure of 3 mmHg.    Neuro/Psych  PSYCHIATRIC DISORDERS  Depression    negative neurological ROS     GI/Hepatic Neg liver ROS,GERD  ,,  Endo/Other    Morbid obesity  Renal/GU ESRF and DialysisRenal disease     Musculoskeletal  (+) Arthritis ,    Abdominal   Peds  Hematology  (+) Blood dyscrasia (on eliquis), anemia Lab Results      Component                Value               Date                      WBC                       8.5                 05/04/2021                HGB                      10.6 (L)            05/04/2021                HCT                      33.5 (L)            05/04/2021                MCV                      96.5                05/04/2021                PLT                      406 (H)  05/04/2021              Anesthesia Other Findings   Reproductive/Obstetrics                             Anesthesia Physical Anesthesia Plan  ASA: 4  Anesthesia Plan: General   Post-op Pain Management: Minimal or no pain anticipated   Induction: Intravenous  PONV Risk Score and Plan: 2 and Ondansetron and Treatment may vary due to age or medical condition  Airway Management Planned: Mask, Natural Airway and Nasal Cannula  Additional Equipment:   Intra-op Plan:   Post-operative Plan:   Informed Consent: I have reviewed the patients History and Physical, chart, labs and discussed the procedure including the risks, benefits and alternatives for the proposed anesthesia with the patient or authorized representative who has indicated his/her understanding and acceptance.     Dental advisory given  Plan Discussed with: CRNA  Anesthesia Plan Comments:         Anesthesia Quick Evaluation

## 2022-06-07 NOTE — Interval H&P Note (Signed)
History and Physical Interval Note:  06/07/2022 2:41 PM  Phillip Young  has presented today for surgery, with the diagnosis of atrial flutter.  The various methods of treatment have been discussed with the patient and family. After consideration of risks, benefits and other options for treatment, the patient has consented to  Procedure(s): CARDIOVERSION (N/A) as a surgical intervention.  The patient's history has been reviewed, patient examined, no change in status, stable for surgery.  I have reviewed the patient's chart and labs.  Questions were answered to the patient's satisfaction.     Alette Kataoka [employer]

## 2022-06-07 NOTE — Transfer of Care (Signed)
Immediate Anesthesia Transfer of Care Note  Patient: Phillip Young  Procedure(s) Performed: CARDIOVERSION  Patient Location: Endoscopy Unit  Anesthesia Type:General  Level of Consciousness: drowsy  Airway & Oxygen Therapy: Patient Spontanous Breathing and Patient connected to nasal cannula oxygen  Post-op Assessment: Report given to RN and Post -op Vital signs reviewed and stable  Post vital signs: Reviewed and stable  Last Vitals:  Vitals Value Taken Time  BP 136/80 06/07/22 1510  Temp    Pulse 109 06/07/22 1513  Resp 31 06/07/22 1513  SpO2 100 % 06/07/22 1513  Vitals shown include unvalidated device data.  Last Pain:  Vitals:   06/07/22 1430  TempSrc: Temporal  PainSc: 0-No pain      Patients Stated Pain Goal: 0 (37/09/64 3838)  Complications: No notable events documented.

## 2022-06-07 NOTE — Progress Notes (Signed)
Chart reviewed and noted pt's likely need for HD 4x's a week at d/c. Contacted [facility] to inquire about possible options for 4x's a week HD at d/c. Will await clinic response.   Melven Sartorius Renal Navigator [phone]

## 2022-06-07 NOTE — H&P (View-Only) (Signed)
Patient ID: Phillip Young, male   DOB: 1986/03/22, 36 y.o.   MRN: [id number]     Advanced Heart Failure Rounding Note  PCP-Cardiologist: [name], MD   Subjective:    Admitted w/ AFL w/ RVR + acute hypoxic respiratory failure due to PNA vs amiodarone lung toxicity.   CT chest: mass-like consolidations and clustered areas of nodularity bilaterally, multifocal PNA or drug toxicity.   10/28: emergent DCCV in ED>>NSR 11/2: reverted back to AFL w/ RVR,. PO amio discontinued. Started metoprolol 12.80m q6h. Abx started for PNA 11/3: DCCV to NSR 11/6: Back in AFL. Completed PNA coverage with ceftriaxone/doxycycline. Repeat ESR higher 113>>135  11/7: Started prednisone for possible amiodarone toxicity  This morning, he remains in atrial flutter rate around 130.  He is very short of breath and orthopneic this morning, feels "terrible." Had last HD on Saturday.   Objective:   Weight Range: (!) 180.8 kg Body mass index is 54.06 kg/m.   Vital Signs:   Temp:  [97.6 F (36.4 C)-98.1 F (36.7 C)] 98 F (36.7 C) (11/12 2040) Pulse Rate:  [131-134] 131 (11/12 2040) Resp:  [24] 24 (11/12 2040) BP: (107-123)/(71-87) 123/82 (11/12 2040) SpO2:  [98 %-100 %] 98 % (11/12 2040) Last BM Date : 06/05/22  Weight change: Filed Weights   06/04/22 1833 06/05/22 1321 06/05/22 1727  Weight: (!) 182 kg (!) 183.1 kg (!) 180.8 kg    Intake/Output:  No intake or output data in the 24 hours ending 06/07/22 0815     Physical Exam    General: Uncomfortable, sitting up in chair.  Neck: Thick, JVP 14+, no thyromegaly or thyroid nodule.  Lungs: Decreased BS at bases.  CV: Nondisplaced PMI.  Heart tachy, irregular S1/S2, no S3/S4, no murmur.  Trace ankle edema.  Abdomen: Soft, nontender, no hepatosplenomegaly, no distention.  Skin: Intact without lesions or rashes.  Neurologic: Alert and oriented x 3.  Psych: Normal affect. Extremities: No clubbing or cyanosis.  HEENT: Normal.    Telemetry    Atrial flutter 130 (personally reviewed)  Labs    CBC Recent Labs    06/05/22 0154 06/06/22 0313  WBC 18.7* 21.5*  HGB 8.0* 8.2*  HCT 23.9* 23.8*  MCV 104.4* 104.8*  PLT 336 2676  Basic Metabolic Panel Recent Labs    06/05/22 0154 06/06/22 0313  NA 134* 131*  K 4.3 4.3  CL 95* 92*  CO2 21* 18*  GLUCOSE 133* 144*  BUN 59* 65*  CREATININE 8.08* 8.40*  CALCIUM 8.1* 7.6*   Liver Function Tests No results for input(s): "AST", "ALT", "ALKPHOS", "BILITOT", "PROT", "ALBUMIN" in the last 72 hours.   No results for input(s): "LIPASE", "AMYLASE" in the last 72 hours. Cardiac Enzymes No results for input(s): "CKTOTAL", "CKMB", "CKMBINDEX", "TROPONINI" in the last 72 hours.  BNP: BNP (last 3 results) Recent Labs    02/18/22 0154 03/26/22 1958 05/22/22 0732  BNP 373.8* 374.2* 373.1*    ProBNP (last 3 results) No results for input(s): "PROBNP" in the last 8760 hours.   D-Dimer No results for input(s): "DDIMER" in the last 72 hours. Hemoglobin A1C No results for input(s): "HGBA1C" in the last 72 hours. Fasting Lipid Panel No results for input(s): "CHOL", "HDL", "LDLCALC", "TRIG", "CHOLHDL", "LDLDIRECT" in the last 72 hours. Thyroid Function Tests Recent Labs    06/04/22 1924  TSH 3.261     Other results:   Imaging    No results found.   Medications:     Scheduled Medications:  (  feeding supplement) PROSource Plus  30 mL Oral BID BM   apixaban  5 mg Oral BID   Chlorhexidine Gluconate Cloth  6 each Topical Q0600   darbepoetin (ARANESP) injection - DIALYSIS  200 mcg Subcutaneous Q Sat-1800   fluticasone furoate-vilanterol  1 puff Inhalation Daily   lanthanum  2,000 mg Oral TID WC   melatonin  5 mg Oral QHS   metoprolol succinate  25 mg Oral BID   midodrine  5 mg Oral TID WC   pantoprazole  40 mg Oral Daily   predniSONE  40 mg Oral Q breakfast   torsemide  100 mg Oral Once per day on Sun Mon Wed Fri    Infusions:    PRN  Medications: acetaminophen **OR** acetaminophen, guaiFENesin-dextromethorphan, ondansetron, polyethylene glycol, senna-docusate    Patient Profile   36 y/o male w/ chronic biventricular heart failure due to NICM, PAF/AFL s/p multiple DCCVs, multiple intolerances to amiodarone, ESRD on iHD, OSA on CPAP and morbid obesity, admitted for acute hypoxic respiratory failure and hypotension in the setting of recurrent ALF w/ RVR, requiring emergent DCCV in ED. Now w/ concern for potential amiodarone pulmonary toxicity. Also being treated for possible PNA.   Assessment/Plan   1. Atrial flutter/fibrillation: History of multiple DCCVs. Amiodarone previously stopped due to elevated LFTs. Restarted 05/20/22 by Dr. Curt Bears.  Admitted w/ hemodynamically unstable AFL w/ RVR requiring emergent DCCV>>NSR in ER.  Atrial flutter recurred with RVR post-DCCV.  Now w/ concern for possible amiodarone pulmonary toxicity w/ elevated ESR 113 and abnormal high resolution chest CT showing mass-like consolidations and clustered areas of nodularity bilaterally, multifocal PNA or drug toxicity. Amiodarone stopped (he says breathing got worse after restarting on amiodarone).  DCCV twice this admission but fibrillation and flutter have recurred.  I reviewed ECGs with EP, he has been in BOTH atrial flutter (typical and atypical) and fibrillation this admission.  Today, he is in atrial flutter. We have been struggling to rate control him.  - Per Dr. Curt Bears, he is not a candidate for atrial flutter or fibrillation ablation.  Has multiple flutter circuits and also has had fibrillation this admission.  Would not ablate flutter alone without treating atrial fibrillation, and he would not be a candidate for atrial fibrillation ablation.  - Continue Toprol XL 25 mg bid, has not been able to tolerate higher doses.  - Continue midodrine 5 mg tid to maintain BP.   - Poor candidate for AV nodal ablation with BiV pacing due to ESRD and infection  risk, EP has evaluated and deemed not a candidate.  - Not candidate for amiodarone (lung and hepatic toxicity) or Tikosyn (ESRD), no anti-arrhythmic option.  - Will try to cardiovert one last time today (if we can get him to HD this morning and improve his breathing/orthopnea). He has had 2 DCCVs this admission that did not hold.  - Continue Eliquis 5 mg bid  2.  Chronic systolic CHF: Nonischemic CMP, LHC 2021 with no CAD. Previous hospitalizations w/shock in setting of rapid AFib/Flutter requiring milrinone. Most recent echo 8/23 with EF 20-25%, RV mildly reduced. ESRD, volume controlled by HD.  This morning, he is very short of breath and orthopneic, +volume overload on exam.  - Needs HD this morning, appears quite uncomfortable.  I spoke with nephrology about this.  - GDMT limited by CKD and hypotension requiring midodrine  - Not candidate for ICD given obesity and dialysis  - Not candidate for advanced therapies.  3. Acute Hypoxic Respiratory Failure:  Combination of volume overload and multifocal PNA.  High resolution CT chest showed mass-like consolidations and clustered areas of nodularity bilaterally, multifocal PNA or drug toxicity. Unclear if lung opacifications are infectious or inflammatory. Repeat ESR higher 113>>135. Suspect amiodarone lung toxicity, pulmonary following.  He has completed a course of abx with no improvement in CT chest or inflammatory markers.  - Amiodarone has been stopped - Prednisone started for suspected inflammatory lung disease from amiodarone lung toxicity, will followup as outpatient with pulmonary. Does feel improvement with prednisone.  - Still on 8L [place].  4. ESRD: HD per nephrology, on T,Th,Sat schedule.  Volume overloaded and uncomfortable today.  - Needs HD this morning.  5. Obesity: Body mass index is 55.64 kg/m.  - wt loss advised 6. Fe deficiency anemia.  - T-sat < 20%. Received Ferrlecit per primary team  7. OSA: Continue CPAP QHS  Difficult  situation as outlined above with very limited options. Will reattempt cardioversion hopefully later today if we can get some fluid off with HD.   Length of Stay: [place] 06/07/2022 8:15 AM

## 2022-06-07 NOTE — Progress Notes (Signed)
Pt placed on Bipap on documented setting for the night. Pt tolerating well

## 2022-06-07 NOTE — Progress Notes (Signed)
Patient refused to take [facility] inhaler. Patient with SOB, placed patient on the BiPAP but refused to keep the BiPAP on for less than a minute. Pt states "the air is stale and doesn't help". MD made aware and at bedside.

## 2022-06-07 NOTE — Progress Notes (Signed)
Received patient in bed to unit.  Alert and oriented.  Informed consent signed and in chart.   Treatment initiated: [place] Treatment completed: 1358  Patient tolerated well.  Transported back to the room  Alert, without acute distress.  Hand-off given to patient's nurse.   Access used: catheter Access issues: n/a  Total UF removed: 3.5L Medication(s) given: Sodium Citrate in catheter lumens Post HD weight: unable to get patient was picked up by Endo staff     06/07/22 1430  Vitals  Temp 97.8 F (36.6 C)  Temp Source Temporal  BP (!) 100/37  BP Location Left Arm  Pulse Rate (!) 130  Pulse Rate Source Monitor  Resp (!) 27  Oxygen Therapy  SpO2 98 %  O2 Device Nasal Cannula  O2 Flow Rate (L/min) 8 L/min  During Treatment Monitoring  Intra-Hemodialysis Comments Tx completed  Post Treatment  Dialyzer Clearance Lightly streaked  Duration of HD Treatment -hour(s) 3 hour(s)  Liters Processed 72  Fluid Removed (mL) 3500 mL  Tolerated HD Treatment Yes         [facility]

## 2022-06-07 NOTE — Progress Notes (Signed)
Subjective: Seen in room, sitting up in recliner. Increased WOB on 8LNC. Feels overloaded. Can't tolerate BiPAP. Says coughing/chocking with CPAP. Try to get him to dialysis this am.    Objective Vital signs in last 24 hours: Vitals:   06/06/22 0900 06/06/22 1316 06/06/22 1716 06/06/22 2040  BP: 111/81 107/87 109/71 123/82  Pulse: (!) 134 (!) 131 (!) 131 (!) 131  Resp: (!) 24 (!) 24 (!) 24 (!) 24  Temp: 98.1 F (36.7 C)  97.6 F (36.4 C) 98 F (36.7 C)  TempSrc: Axillary  Oral Oral  SpO2: 100% 100% 98% 98%  Weight:      Height:          Physical Exam: General: Alert NAD  Heart: Irregular, tachy. No m,r,g  Lungs: Increased WOB. Shallow breaths   Abdomen: Obese, NABS soft NTND Extremities: No significant LE edema Dialysis Access: IJ [facility]   Outpatient Dialysis Orders: [facility] TTS 4:15 425/A1.5x 180 kg 3K/2.5Ca TDC  - Heparin: None/HIT Ab POSITIVE - Mircera 120 q 4 wks (last 10/12) - Hectorol 6 TIW, Sensipar 30 TIW   Assesment/Plan: 1. Atrial flutter with RVR: S/P cardioversion x 2 with discontinuation of amiodarone. CT chest concerning for amiodarone lung.  Cardiology//HF team following closely -- difficult situation.  On apixaban and metoprolol XL 25 mg twice daily noted plans for 06/07/22 DCCV per cardiology 2. Acute Hypoxic Resp Failure Combination/volume overload ?PNA, versus amiodarone lung toxicity/OSA.  Prednisone started for possible amiodarone toxicity noted. HF team following.  Pneumonia coverage with antibiotics. On nightly CPAP. UF with HD as able.  3. ESRD: HD TTS.  Sodium citrate should be ordered for all TDC locks, no heparin. Extra HD today 11/13 for additional volume removal   4 Hypotension:Blood pressure acceptable.  On metoprolol and midodrine  5. Anemia: Of CKD Hgb 8.2 .  on Aranesp 200 mcg q. Saturday 6. CKD-MBD: Calcium ok. Phos not to goal.  Continue  lanthanum.   7. Nutrition: Does not like renal diet. K+ has been controlled, can trial heart healthy diet  but he agrees to go back on renal diet if K+/phos worsen. Discussed that he needs to continue fluid restrictions.  8. HFrEF: 20-25%  Lynnda Child PA-C [facility] 06/07/2022,8:34 AM   Labs: Basic Metabolic Panel: Recent Labs  Lab 06/04/22 0255 06/05/22 0154 06/06/22 0313  NA 135 134* 131*  K 5.2* 4.3 4.3  CL 98 95* 92*  CO2 18* 21* 18*  GLUCOSE 115* 133* 144*  BUN 73* 59* 65*  CREATININE 10.05* 8.08* 8.40*  CALCIUM 8.7* 8.1* 7.6*  PHOS 7.1*  --   --     Liver Function Tests: Recent Labs  Lab 06/04/22 0255  ALBUMIN 2.9*    No results for input(s): "LIPASE", "AMYLASE" in the last 168 hours. No results for input(s): "AMMONIA" in the last 168 hours. CBC: Recent Labs  Lab 06/02/22 0633 06/03/22 0722 06/04/22 0255 06/05/22 0154 06/06/22 0313  WBC 13.0* 15.5* 18.3* 18.7* 21.5*  HGB 8.0* 8.2* 7.9* 8.0* 8.2*  HCT 21.8* 22.2* 22.4* 23.9* 23.8*  MCV 102.8* 102.3* 106.7* 104.4* 104.8*  PLT 328 366 425* 336 292    Cardiac Enzymes: No results for input(s): "CKTOTAL", "CKMB", "CKMBINDEX", "TROPONINI" in the last 168 hours. CBG: No results for input(s): "GLUCAP" in the last 168 hours.  Studies/Results: No results found. Medications:   (feeding supplement) PROSource Plus  30 mL Oral BID BM   apixaban  5 mg Oral BID   Chlorhexidine Gluconate Cloth  6 each Topical Q0600   darbepoetin (ARANESP) injection - DIALYSIS  200 mcg Subcutaneous Q Sat-1800   fluticasone furoate-vilanterol  1 puff Inhalation Daily   lanthanum  2,000 mg Oral TID WC   melatonin  5 mg Oral QHS   metoprolol succinate  25 mg Oral BID   midodrine  5 mg Oral TID WC   pantoprazole  40 mg Oral Daily   predniSONE  40 mg Oral Q breakfast   torsemide  100 mg Oral Once per day on Sun Mon Wed Fri

## 2022-06-07 NOTE — Anesthesia Postprocedure Evaluation (Signed)
Anesthesia Post Note  Patient: Phillip Young  Procedure(s) Performed: CARDIOVERSION     Patient location during evaluation: Endoscopy Anesthesia Type: General Level of consciousness: awake and alert Pain management: pain level controlled Vital Signs Assessment: post-procedure vital signs reviewed and stable Respiratory status: spontaneous breathing, nonlabored ventilation, respiratory function stable and patient connected to nasal cannula oxygen Cardiovascular status: blood pressure returned to baseline and stable Postop Assessment: no apparent nausea or vomiting Anesthetic complications: no  No notable events documented.  Last Vitals:  Vitals:   06/07/22 1430 06/07/22 1502  BP: (!) 100/37 133/88  Pulse: (!) 130 (!) 112  Resp: (!) 27 (!) 34  Temp: 36.6 C   SpO2: 98% 100%    Last Pain:  Vitals:   06/07/22 1430  TempSrc: Temporal  PainSc: 0-No pain                 Theo Krumholz,W. EDMOND

## 2022-06-07 NOTE — Progress Notes (Signed)
Mobility Specialist Progress Note:   06/07/22 1124  Mobility  Activity Off unit   Pt at HD at time of visit. Will f/u as able.   Andrey Campanile Mobility Specialist Please contact via SecureChat or  Rehab office at [phone]

## 2022-06-07 NOTE — Progress Notes (Addendum)
Notified of worsening clinical status today during rounds, went bedside to evaluate.  Patient was sitting upright in chair, experiencing increased respiratory effort with high high-flow nasal cannula. Lungs were clear to auscultation and he did not appear in acute distress. He reported feeling volume overloaded and described some oral discomfort. Discussed plan for hemodialysis later today. Treatment team asked nursing staff to monitor closely.

## 2022-06-08 ENCOUNTER — Encounter (HOSPITAL_COMMUNITY): Payer: Self-pay | Admitting: Cardiology

## 2022-06-08 DIAGNOSIS — J9601 Acute respiratory failure with hypoxia: Secondary | ICD-10-CM | POA: Diagnosis not present

## 2022-06-08 DIAGNOSIS — R748 Abnormal levels of other serum enzymes: Secondary | ICD-10-CM

## 2022-06-08 LAB — CBC
HCT: 24.2 % — ABNORMAL LOW (ref 39.0–52.0)
Hemoglobin: 8.4 g/dL — ABNORMAL LOW (ref 13.0–17.0)
MCH: 36.2 pg — ABNORMAL HIGH (ref 26.0–34.0)
MCHC: 34.7 g/dL (ref 30.0–36.0)
MCV: 104.3 fL — ABNORMAL HIGH (ref 80.0–100.0)
Platelets: 289 10*3/uL (ref 150–400)
RBC: 2.32 MIL/uL — ABNORMAL LOW (ref 4.22–5.81)
RDW: 15.7 % — ABNORMAL HIGH (ref 11.5–15.5)
WBC: 24.7 10*3/uL — ABNORMAL HIGH (ref 4.0–10.5)
nRBC: 4.5 % — ABNORMAL HIGH (ref 0.0–0.2)

## 2022-06-08 LAB — COMPREHENSIVE METABOLIC PANEL
ALT: 1755 U/L — ABNORMAL HIGH (ref 0–44)
AST: 544 U/L — ABNORMAL HIGH (ref 15–41)
Albumin: 3.1 g/dL — ABNORMAL LOW (ref 3.5–5.0)
Alkaline Phosphatase: 180 U/L — ABNORMAL HIGH (ref 38–126)
Anion gap: 19 — ABNORMAL HIGH (ref 5–15)
BUN: 74 mg/dL — ABNORMAL HIGH (ref 6–20)
CO2: 19 mmol/L — ABNORMAL LOW (ref 22–32)
Calcium: 7.4 mg/dL — ABNORMAL LOW (ref 8.9–10.3)
Chloride: 93 mmol/L — ABNORMAL LOW (ref 98–111)
Creatinine, Ser: 9.32 mg/dL — ABNORMAL HIGH (ref 0.61–1.24)
GFR, Estimated: 7 mL/min — ABNORMAL LOW (ref >60)
Glucose, Bld: 126 mg/dL — ABNORMAL HIGH (ref 70–99)
Potassium: 4.9 mmol/L (ref 3.5–5.1)
Sodium: 131 mmol/L — ABNORMAL LOW (ref 135–145)
Total Bilirubin: 1.3 mg/dL — ABNORMAL HIGH (ref 0.3–1.2)
Total Protein: 8 g/dL (ref 6.5–8.1)

## 2022-06-08 MED ORDER — ANTICOAGULANT SODIUM CITRATE 4% (200MG/5ML) IV SOLN
5.0000 mL | Status: DC | PRN
  Administered 2022-06-08: 10 mL
  Filled 2022-06-08 (×2): qty 5
  Filled 2022-06-08: qty 10

## 2022-06-08 MED ORDER — VITAMIN K1 10 MG/ML IJ SOLN
5.0000 mg | Freq: Every day | INTRAVENOUS | Status: AC
  Administered 2022-06-08 – 2022-06-10 (×3): 5 mg via INTRAVENOUS
  Filled 2022-06-08 (×3): qty 0.5

## 2022-06-08 MED ORDER — ALTEPLASE 2 MG IJ SOLR: 2.0000 mg | Freq: Once | INTRAMUSCULAR | Status: DC | PRN

## 2022-06-08 MED ORDER — TORSEMIDE 20 MG PO TABS
100.0000 mg | ORAL_TABLET | ORAL | Status: DC
  Administered 2022-06-09: 100 mg via ORAL
  Filled 2022-06-08: qty 5

## 2022-06-08 MED ORDER — HYDROXYZINE HCL 25 MG PO TABS
25.0000 mg | ORAL_TABLET | Freq: Once | ORAL | Status: AC | PRN
  Administered 2022-06-08: 25 mg via ORAL
  Filled 2022-06-08: qty 1

## 2022-06-08 MED ORDER — VITAMIN K1 10 MG/ML IJ SOLN
5.0000 mg | Freq: Every day | INTRAVENOUS | Status: DC
  Filled 2022-06-08: qty 0.5

## 2022-06-08 NOTE — TOC CM/SW Note (Signed)
HF TOC CM emailed pt's [employer] auth to disclose paperwork to [employer] per patient's request to continue his long term disability. [place], Heart Failure TOC CM [phone]

## 2022-06-08 NOTE — Progress Notes (Addendum)
Patient ID: Phillip Young, male   DOB: 14-Mar-1986, 36 y.o.   MRN: [id number]     Advanced Heart Failure Rounding Note  PCP-Cardiologist: [name], MD   Subjective:    Admitted w/ AFL w/ RVR + acute hypoxic respiratory failure due to PNA vs amiodarone lung toxicity.   CT chest: mass-like consolidations and clustered areas of nodularity bilaterally, multifocal PNA or drug toxicity.   10/28: emergent DCCV in ED>>NSR 11/2: reverted back to AFL w/ RVR,. PO amio discontinued. Started metoprolol 12.44m q6h. Abx started for PNA 11/3: DCCV to NSR 11/6: Back in AFL. Completed PNA coverage with ceftriaxone/doxycycline. Repeat ESR higher 113>>135  11/7: Started prednisone for possible amiodarone toxicity 11/13: DCCV>>NSR  S/p DCCV yesterday. In NSR today. Feels good this morning on Bipap. Denies CP and SOB. Asking for apple juice.   Objective:   Weight Range: (!) 162.5 kg Body mass index is 48.59 kg/m.   Vital Signs:   Temp:  [97.5 F (36.4 C)-99 F (37.2 C)] 97.5 F (36.4 C) (11/14 0400) Pulse Rate:  [36-132] 106 (11/14 0400) Resp:  [18-37] 20 (11/14 0400) BP: (90-163)/(36-129) 140/78 (11/14 0400) SpO2:  [92 %-100 %] 100 % (11/14 0400) FiO2 (%):  [60 %] 60 % (11/13 0903) Weight:  [162.5 kg-166 kg] 162.5 kg (11/13 1410) Last BM Date : 06/05/22  Weight change: Filed Weights   06/05/22 1727 06/07/22 1000 06/07/22 1410  Weight: (!) 180.8 kg (!) 166 kg (!) 162.5 kg    Intake/Output:   Intake/Output Summary (Last 24 hours) at 06/08/2022 0753 Last data filed at 06/07/2022 1500 Gross per 24 hour  Intake 248.67 ml  Output 3500 ml  Net -3251.33 ml       Physical Exam    General:  well appearing.  No respiratory difficulty on bipap HEENT: normal Neck: supple. JVD ~10. Carotids 2+ bilat; no bruits. No lymphadenopathy or thyromegaly appreciated. Cor: PMI nondisplaced. Regular rate & rhythm. No rubs, gallops or murmurs. Lungs: clear, diminished bases Abdomen: soft,  nontender, nondistended. No hepatosplenomegaly. No bruits or masses. Good bowel sounds. Extremities: no cyanosis, clubbing, rash, edema  Neuro: alert & oriented x 3, cranial nerves grossly intact. moves all 4 extremities w/o difficulty. Affect pleasant.   Telemetry   NSR 90s/low 100s (personally reviewed)  Labs    CBC Recent Labs    06/06/22 0313 06/07/22 1432 06/08/22 0327  WBC 21.5*  --  24.7*  HGB 8.2* 9.9* 8.4*  HCT 23.8* 29.0* 24.2*  MCV 104.8*  --  104.3*  PLT 292  --  2546  Basic Metabolic Panel Recent Labs    06/06/22 0313 06/07/22 1432 06/08/22 0327  NA 131* 131* 131*  K 4.3 5.1 4.9  CL 92* 94* 93*  CO2 18*  --  19*  GLUCOSE 144* 95 126*  BUN 65* 61* 74*  CREATININE 8.40* 7.40* 9.32*  CALCIUM 7.6*  --  7.4*   Liver Function Tests Recent Labs    06/08/22 0327  AST 544*  ALT 1,755*  ALKPHOS 180*  BILITOT 1.3*  PROT 8.0  ALBUMIN 3.1*     No results for input(s): "LIPASE", "AMYLASE" in the last 72 hours. Cardiac Enzymes No results for input(s): "CKTOTAL", "CKMB", "CKMBINDEX", "TROPONINI" in the last 72 hours.  BNP: BNP (last 3 results) Recent Labs    02/18/22 0154 03/26/22 1958 05/22/22 0732  BNP 373.8* 374.2* 373.1*    ProBNP (last 3 results) No results for input(s): "PROBNP" in the last 8760 hours.  D-Dimer No results for input(s): "DDIMER" in the last 72 hours. Hemoglobin A1C No results for input(s): "HGBA1C" in the last 72 hours. Fasting Lipid Panel No results for input(s): "CHOL", "HDL", "LDLCALC", "TRIG", "CHOLHDL", "LDLDIRECT" in the last 72 hours. Thyroid Function Tests No results for input(s): "TSH", "T4TOTAL", "T3FREE", "THYROIDAB" in the last 72 hours.  Invalid input(s): "FREET3"    Other results:   Imaging    DG Chest 1 View  Result Date: 06/07/2022 CLINICAL DATA:  Shortness of breath and productive cough. EXAM: CHEST  1 VIEW COMPARISON:  Chest radiographs 05/26/2022 FINDINGS: A right jugular dialysis catheter  remains in place, terminating over the right atrium. The cardiac silhouette remains moderately to prominently enlarged. There is likely mild persistent pulmonary vascular congestion. No definite airspace consolidation, sizeable pleural effusion, or pneumothorax is identified within limitations of portable technique, cardiomegaly, and patient body habitus. IMPRESSION: Cardiomegaly and mild pulmonary vascular congestion. Electronically Signed   By: Logan Bores M.D.   On: 06/07/2022 16:33     Medications:     Scheduled Medications:  (feeding supplement) PROSource Plus  30 mL Oral BID BM   apixaban  5 mg Oral BID   Chlorhexidine Gluconate Cloth  6 each Topical Q0600   darbepoetin (ARANESP) injection - DIALYSIS  200 mcg Subcutaneous Q Sat-1800   fluticasone furoate-vilanterol  1 puff Inhalation Daily   lanthanum  2,000 mg Oral TID WC   melatonin  5 mg Oral QHS   metoprolol succinate  25 mg Oral BID   midodrine  5 mg Oral TID WC   pantoprazole  40 mg Oral Daily   predniSONE  40 mg Oral Q breakfast   torsemide  100 mg Oral Once per day on Sun Mon Wed Fri    Infusions:  anticoagulant sodium citrate       PRN Medications: acetaminophen **OR** acetaminophen, alteplase, anticoagulant sodium citrate, guaiFENesin-dextromethorphan, ondansetron, polyethylene glycol, senna-docusate    Patient Profile   36 y/o male w/ chronic biventricular heart failure due to NICM, PAF/AFL s/p multiple DCCVs, multiple intolerances to amiodarone, ESRD on iHD, OSA on CPAP and morbid obesity, admitted for acute hypoxic respiratory failure and hypotension in the setting of recurrent ALF w/ RVR, requiring emergent DCCV in ED. Now w/ concern for potential amiodarone pulmonary toxicity. Also being treated for possible PNA.   Assessment/Plan   1. Atrial flutter/fibrillation: History of multiple DCCVs. Amiodarone previously stopped due to elevated LFTs. Restarted 05/20/22 by Dr. Curt Bears.  Admitted w/ hemodynamically  unstable AFL w/ RVR requiring emergent DCCV>>NSR in ER.  Atrial flutter recurred with RVR post-DCCV.  Now w/ concern for possible amiodarone pulmonary toxicity w/ elevated ESR 113 and abnormal high resolution chest CT showing mass-like consolidations and clustered areas of nodularity bilaterally, multifocal PNA or drug toxicity. Amiodarone stopped (he says breathing got worse after restarting on amiodarone).  DCCV twice this admission but fibrillation and flutter have recurred.  I reviewed ECGs with EP, he has been in BOTH atrial flutter (typical and atypical) and fibrillation this admission.  Today, he is in atrial flutter. We have been struggling to rate control him.  - Per Dr. Curt Bears, he is not a candidate for atrial flutter or fibrillation ablation.  Has multiple flutter circuits and also has had fibrillation this admission.  Would not ablate flutter alone without treating atrial fibrillation, and he would not be a candidate for atrial fibrillation ablation.  - Continue Toprol XL 25 mg bid, has not been able to tolerate higher doses.  -  Continue midodrine 5 mg tid to maintain BP.   - Poor candidate for AV nodal ablation with BiV pacing due to ESRD and infection risk, EP has evaluated and deemed not a candidate.  - Not candidate for amiodarone (lung and hepatic toxicity) or Tikosyn (ESRD), no anti-arrhythmic option.  - DCCV yesterday (11/13). Back in NSR.  - Continue Eliquis 5 mg bid  2.  Chronic systolic CHF: Nonischemic CMP, LHC 2021 with no CAD. Previous hospitalizations w/shock in setting of rapid AFib/Flutter requiring milrinone. Most recent echo 8/23 with EF 20-25%, RV mildly reduced. ESRD, volume controlled by HD.   - GDMT limited by CKD and hypotension requiring midodrine  - Not candidate for ICD given obesity and dialysis  - Not candidate for advanced therapies.  3. Acute Hypoxic Respiratory Failure: Combination of volume overload and multifocal PNA.  High resolution CT chest showed  mass-like consolidations and clustered areas of nodularity bilaterally, multifocal PNA or drug toxicity. Unclear if lung opacifications are infectious or inflammatory. Repeat ESR higher 113>>135. Suspect amiodarone lung toxicity, pulmonary following.  He has completed a course of abx with no improvement in CT chest or inflammatory markers.  - Amiodarone has been stopped - Prednisone started for suspected inflammatory lung disease from amiodarone lung toxicity, will followup as outpatient with pulmonary. Does feel improvement with prednisone.  - Still req  6-8L [place].  4. ESRD: HD per nephrology, on T,Th,Sat schedule.   - Volume improved, s/p iHD yesterday 5. Obesity: Body mass index is 55.64 kg/m.  - wt loss advised 6. Fe deficiency anemia.  - T-sat < 20%. Received Ferrlecit per primary team  7. OSA: Continue CPAP QHS   Length of Stay: [place] AGACNP-BC  06/08/2022 7:53 AM  Patient seen with NP, agree with the above note.   He remains in NSR today after DCCV yesterday.  Breathing better, still on 6L home oxygen.  Had HD yesterday and weight is down.   General: NAD Neck: Thick, JVP difficult, no thyromegaly or thyroid nodule.  Lungs: Clear to auscultation bilaterally with normal respiratory effort. CV: Nondisplaced PMI.  Heart regular S1/S2, no S3/S4, no murmur.  No peripheral edema.   Abdomen: Soft, nontender, no hepatosplenomegaly, no distention.  Skin: Intact without lesions or rashes.  Neurologic: Alert and oriented x 3.  Psych: Normal affect. Extremities: No clubbing or cyanosis.  HEENT: Normal.   He remains on NSR.  He is on Toprol XL.  No anti-arrhythmic, ablation or device option unfortunately.  He feels much better in NSR.    Agree with HD 4 times/week in order to manage volume.  To get HD again today.   I suspect he will need home oxygen.  Plan has been to continue prednisone for possible amiodarone-induced lung injury and followup with pulmonary as outpatient for  monitoring/tapering.   LFTs checked today with marked elevation in transaminases.  Last LFTs on 05/28/22 were normal.  Unlikely that this is due to amiodarone as admission LFTs were normal.  Possible rise due to shock liver physiology with cardiomyopathy and volume overload + persistent atrial arrhythmias.  - Trend LFTs - Check RUQ [place]  [name] 06/08/2022 8:31 AM

## 2022-06-08 NOTE — Progress Notes (Signed)
Subjective:  Seen in room. Had urgent HD yesterday with 3.5L removed. Breathing improved. Had another cardioversion yesterday. Was able to get some sleep with BiPAP. For dialysis again today.    Objective Vital signs in last 24 hours: Vitals:   06/07/22 2254 06/08/22 0000 06/08/22 0400 06/08/22 0807  BP:  138/84 (!) 140/78 101/60  Pulse: (!) 101 (!) 102 (!) 106 92  Resp: (!) 28 18 20 20   Temp:  97.6 F (36.4 C) (!) 97.5 F (36.4 C) 98.4 F (36.9 C)  TempSrc:  Oral Oral Oral  SpO2: 98% 100% 100%   Weight:      Height:          Physical Exam: General: Alert NAD  Heart: Irregular, tachy. No m,r,g  Lungs: Increased WOB. Shallow breaths   Abdomen: Obese, NABS soft NTND Extremities: No significant LE edema Dialysis Access: [facility]   Outpatient Dialysis Orders: [facility] TTS 4:15 425/A1.5x 180 kg 3K/2.5Ca TDC  - Heparin: None/HIT Ab POSITIVE - Mircera 120 q 4 wks (last 10/12) - Hectorol 6 TIW, Sensipar 30 TIW   Assesment/Plan: 1. Atrial flutter with RVR: S/P cardioversion x3. Last on 11/13. Amiodarone d/c'd. CT chest concerning for amiodarone lung.  Cardiology//HF team following closely -- difficult situation. Limited options.   On apixaban and metoprolol.  2. Acute Hypoxic Resp Failure Combination/volume overload ?PNA, versus amiodarone lung toxicity/OSA.  Prednisone started for possible amiodarone toxicity noted. HF team following.  Pneumonia coverage with antibiotics. On nightly CPAP. UF with HD as able.  3. ESRD: Usual HD TTS. Will do 4x/week schedule while here. HD MTTS.   Sodium citrate should be ordered for all TDC locks, no heparin.  4 Hypotension:Blood pressure acceptable.  On metoprolol and midodrine  5. Anemia: Of CKD Hgb 8.2 .  on Aranesp 200 mcg q. Saturday 6. CKD-MBD: Calcium ok. Phos not to goal.  Continue  lanthanum.   7. Nutrition: Does not like renal diet. K+ has been controlled, can trial heart healthy diet but he agrees to go back on renal diet if K+/phos worsen.  Discussed that he needs to continue fluid restrictions.  8. HFrEF: 20-25%  Lynnda Child PA-C [facility] 06/08/2022,9:30 AM   Labs: Basic Metabolic Panel: Recent Labs  Lab 06/04/22 0255 06/05/22 0154 06/06/22 0313 06/07/22 1432 06/08/22 0327  NA 135 134* 131* 131* 131*  K 5.2* 4.3 4.3 5.1 4.9  CL 98 95* 92* 94* 93*  CO2 18* 21* 18*  --  19*  GLUCOSE 115* 133* 144* 95 126*  BUN 73* 59* 65* 61* 74*  CREATININE 10.05* 8.08* 8.40* 7.40* 9.32*  CALCIUM 8.7* 8.1* 7.6*  --  7.4*  PHOS 7.1*  --   --   --   --     Liver Function Tests: Recent Labs  Lab 06/04/22 0255 06/08/22 0327  AST  --  544*  ALT  --  1,755*  ALKPHOS  --  180*  BILITOT  --  1.3*  PROT  --  8.0  ALBUMIN 2.9* 3.1*    No results for input(s): "LIPASE", "AMYLASE" in the last 168 hours. No results for input(s): "AMMONIA" in the last 168 hours. CBC: Recent Labs  Lab 06/03/22 0722 06/04/22 0255 06/05/22 0154 06/06/22 0313 06/07/22 1432 06/08/22 0327  WBC 15.5* 18.3* 18.7* 21.5*  --  24.7*  HGB 8.2* 7.9* 8.0* 8.2* 9.9* 8.4*  HCT 22.2* 22.4* 23.9* 23.8* 29.0* 24.2*  MCV 102.3* 106.7* 104.4* 104.8*  --  104.3*  PLT  366 425* 336 292  --  289    Cardiac Enzymes: No results for input(s): "CKTOTAL", "CKMB", "CKMBINDEX", "TROPONINI" in the last 168 hours. CBG: No results for input(s): "GLUCAP" in the last 168 hours.  Studies/Results: DG Chest 1 View  Result Date: 06/07/2022 CLINICAL DATA:  Shortness of breath and productive cough. EXAM: CHEST  1 VIEW COMPARISON:  Chest radiographs 05/26/2022 FINDINGS: A right jugular dialysis catheter remains in place, terminating over the right atrium. The cardiac silhouette remains moderately to prominently enlarged. There is likely mild persistent pulmonary vascular congestion. No definite airspace consolidation, sizeable pleural effusion, or pneumothorax is identified within limitations of portable technique, cardiomegaly, and patient body  habitus. IMPRESSION: Cardiomegaly and mild pulmonary vascular congestion. Electronically Signed   By: Logan Bores M.D.   On: 06/07/2022 16:33   Medications:  anticoagulant sodium citrate      (feeding supplement) PROSource Plus  30 mL Oral BID BM   apixaban  5 mg Oral BID   Chlorhexidine Gluconate Cloth  6 each Topical Q0600   darbepoetin (ARANESP) injection - DIALYSIS  200 mcg Subcutaneous Q Sat-1800   fluticasone furoate-vilanterol  1 puff Inhalation Daily   lanthanum  2,000 mg Oral TID WC   melatonin  5 mg Oral QHS   metoprolol succinate  25 mg Oral BID   midodrine  5 mg Oral TID WC   pantoprazole  40 mg Oral Daily   predniSONE  40 mg Oral Q breakfast   [START ON 06/09/2022] torsemide  100 mg Oral Once per day on Sun Wed Fri

## 2022-06-08 NOTE — Progress Notes (Addendum)
Subjective:  Patient was using the BiPAP machine earlier today, reports feeling much better than yesterday. Discussed elevation in liver transaminases and plan for gastroenterology team evaluation.    Objective: Vitals over previous 24hr: Vitals:   06/07/22 2000 06/07/22 2254 06/08/22 0000 06/08/22 0400  BP: 136/78  138/84 (!) 140/78  Pulse: (!) 108 (!) 101 (!) 102 (!) 106  Resp: 20 (!) 28 18 20   Temp: 97.9 F (36.6 C)  97.6 F (36.4 C) (!) 97.5 F (36.4 C)  TempSrc: Oral  Oral Oral  SpO2: 99% 98% 100% 100%  Weight:      Height:        General:      awake and alert, sitting uncomfortably in chair, cooperative, not in acute distress Skin:       warm and dry, intact without any obvious lesions or scars, no rashes Head:      normocephalic and atraumatic, oral mucosa moist Lungs:      increased respiratory effort, breathing slightly labored, symmetrical chest rise, mildly decreased sounds in lower fields but generally difficult to appreciate given body habitus, no crackles or wheezing Cardiac:      tachycardic with irregular rate, normal S1 and S2, no murmurs Neurologic:      oriented to person-place-time, moving all extremities, no gross focal deficits Psychiatric:      euthymic mood with appropriate affect, intelligible speech    Assessment/Plan: Mr Marohl is a 36 year old male with a past medical history of morbid obesity, HFrEF, OSA, OHS, AFib-AFlutter who presented with breath shortness, lightheadedness, and palpitations, now admitted for chronic hypoxic respiratory failure and atrial fibrillation.    ---Acute on chronic hypoxic respiratory failure ---Amiodarone toxicity ---Community acquired pneumonia ---Obstructive sleep apnea on nightly BiPAP ---Obesity hypoventilation syndrome Patient presented on 10-28 with breath shortness, found to have atrial flutter rapid ventricular response and hypotension. Of note patient started amiodarone for atrial fibrillation on  10-26, which was subsequently discontinued upon arrival. Chest radiograph revealed evidence of pulmonary venous congestion and CT showed bilateral consolidations concerning for possible pneumonia. Five-day course of ceftriaxone and doxycycline were completed on 11-6, after which repeat imaging failed to demonstrate any improvement. Volume status has been managed with hemodialysis throughout hospitalization and oxygen requirements have fluctuated since admission. Inciting event remains unclear, though given lack of improvement with antibiotic therapy, amiodarone toxicity is strong possibility. Course of prednisone was started 11-7 to address the likely inflammatory component, patient reports symptomatic improvement since starting this medication but remains on ~6-8L/min of oxygen. > Fluticasone-vilanterol 100-25ug q24 > Prednisone 40mg  q24 > Administer BiPAP nightly > Wean oxygen requirements as tolerated, currently 6L [place] > Volume management with hemodialysis > Trend CBC q24   ---End-stage renal disease on hemodialysis Patient missed hemodialysis session on 10-28 prior to ED arrival. Estimated dry weight around 175-180kg, most recently at 181kg. Volume overload likely contributing to respiratory failure, which has been managed with hemodialysis per his usual Tu-Th-Sa schedule. Given increasing need for frequent filtration and tighter volume control, Monday was added to regular hemodialysis schedule during current hospitalization. > Nephrology consult, appreciate recommendations > Hemodialysis on Mo-Tu-Th-Sa > Torsemide 100mg  on We-Fr-Su > Trend BMP q24   ---Heart failure with reduced ejection fraction ---Hypotension Echocardiogram performed 02-2022 demonstrated ejection fraction of 20-25% with history negative for an ischemic event. Patient presented with lightheadedness and breath shortness. Upon arrival, troponin levels were slightly elevated and BNP at baseline. Management of heart failure is  limited due to ESRD, hypotension requiring midodrine,  and morbid obesity status. Acute heart failure exacerbation likely contributing to patient's symptoms and volume status is currently managed with hemodialysis. > Cardiology consult, appreciate recommendations > Midodrine 5mg  q8, only if SBP>110 > Volume management with hemodialysis on Mo-Tu-Th-Sa > Torsemide 100mg  on We-Fr-Su   ---Atrial flutter post two cardioversions Patient presented on 10-28 with symptomatic tachyarrhythmia and hypotension, found to have atrial flutter plus rapid ventricular response. Atrial flutter initially resolved after cardioversion, but episodes have since returned despite another attempt. Internal medicine team is holding amiodarone given likely pulmonary toxicity. Given multiple chronic conditions, treatment modalities for his atrial fibrillation are very limited. Cardiology and electrophysiology teams are considering all potential options. Metoprolol is currently providing rate control, albeit poorly. Treatment team will continue to monitor his blood pressure carefully and encourage adherence to midodrine. Third cardioversion attempt completed on 11-13, patient has been in sinus tachycardia since this procedure. > Cardiology consult, appreciate recommendations > Metoprolol 25mg  q12 > Apixaban 5mg  q24 > Cardiac telemetry   ---Elevated transaminases Laboratory testing collected 11-14 revealed AST 544 and ALT 1755, both increased since prior study on 11-3, plus elevated PT-INR. Ultrasound of RUQ performed 07-2021 negative for parenchymal liver disease. Given underlying HFrEF with fluid overload and multi-day episode of atrial flutter, etiology is most likely hepatic injury secondary to cardiogenic hypoperfusion. Intravenous vitamin K therapy initiated on 11-14. > Phytonadione 5mg  q24 > Ultrasound RUQ > Trend CMP q24   ---Iron deficiency anemia Laboratory testing in 03-2022 consistent with iron deficiency anemia and  elevated MCV. Upon arrival, hemoglobin was 8.8 and has been stable since admission. Vitamin B9 and TSH levels were normal, B12 markedly elevated. > Consider ferrous sulfate supplementation upon discharge     Principal Problem:   Acute hypoxic respiratory failure ([name]) Active Problems:   OSA (obstructive sleep apnea)   Chronic systolic CHF (congestive heart failure) ([name])   Typical atrial flutter ([name])   Paroxysmal atrial flutter ([name])   Acute respiratory failure with hypoxia ([name])   ESRD (end stage renal disease) on dialysis ([name])   Hypotension   Multiple nodules of lung   Elevated sed rate (elev SR)   Elevated C-reactive protein   Acute on chronic respiratory failure with hypoxia ([place])    Prior to Admission Living Arrangement: home Anticipated Discharge Location: home Barriers to Discharge: medical management Dispo: Anticipated discharge in approximately 2-3 day(s).    Roswell Nickel, MD Internal Medicine PGY-1 Pager [phone]  After 5pm on weekdays and 1pm on weekends: On Call pager [phone]

## 2022-06-08 NOTE — Progress Notes (Addendum)
Received patient in bed to unit.  Alert and oriented.  Informed consent signed and in chart.   Treatment initiated: 6720 Treatment completed: 1830  Patient tolerated well. Pt on bipap during dialysis treatment. Reversed A/V lines for dialysis treatment Transported back to the room  Alert, without acute distress.  Hand-off given to patient's nurse.   Access used: none Access issues: HD catheter right chest  Total UF removed: 3 liters Medication(s) given: sodium citrate Post HD weight: pt deferred opting to be weighed after returning to room    [facility]  06/08/22 1830  Vitals  Temp 98.5 F (36.9 C)  Temp Source Oral  BP 117/88  MAP (mmHg) 998  BP Location Left Arm  BP Method Automatic  Patient Position (if appropriate) Lying  Pulse Rate Source Monitor  Oxygen Therapy  O2 Device Bi-PAP  O2 Flow Rate (L/min) 4 L/min  During Treatment Monitoring  Blood Flow Rate (mL/min) 400 mL/min  Arterial Pressure (mmHg) -220 mmHg  Venous Pressure (mmHg) 230 mmHg  TMP (mmHg) 16 mmHg  Ultrafiltration Rate (mL/min) 1026 mL/min  Dialysate Flow Rate (mL/min) 300 ml/min  HD Safety Checks Performed Yes  Intra-Hemodialysis Comments Tx completed  Post Treatment  Duration of HD Treatment -hour(s) 3.5 hour(s)  Liters Processed 84  Fluid Removed (mL) 3 mL  Tolerated HD Treatment Yes

## 2022-06-08 NOTE — Consult Note (Addendum)
[facility] Consult: 1:10 PM 06/08/2022  LOS: 16 days    Referring Provider: Dr Dareen Piano  Primary Care Physician:  [name], MD Primary Gastroenterologist:  unassigned.      Reason for Consultation:  Elevated transaminases   HPI: Phillip Young is a 36 y.o. male.  Massive obesity, BMI 48.  OSA on bipap.  ESRD, on TTS HD.  Anemia of kidney dz, receives weekly Aranesp at dialysis, also has received Ferrlecit.  Nonischemic cardiomyopathy.  Severe pulmonary venous hypertension.  CHF, LVEF 20 to 25%.  Paroxysmal A-fib/flutter, multiple previous cardioversions. Chronic Eliquis.   Hydradenitis suppurativa, pilonidal cyst.  GERD.  Hyperglycemia/glucose intolerance.  Sacral osteomyelitis, left iliacus and psoas muscle abscess, MSSA bacteremia 03/2021.  Elevated transaminases January/February 2023.  T. bili 1.9, alk phos 171, AST/ALT 297/362.   08/20/2021 RUQ ultrasound unremarkable with no liver parenchymal changes, unremarkable GB, 3.6 mm CBD, normal PV Dopplers LFTs normalized after discontinuation of amiodarone. 03/26/2022 HBV surf Ab reactive, HBV surface Ab quant 283 consistent with immunity.  HCV Ab nonreactive.  Admission 04/17/22 with CHF flare following missed dialysis sessions and flulike illness. 1 of multiple admissions this year for CHF flare  Pt restarted Amiodarone 10/26, 3 d before admission, after outpt cards visit to address recurrent a flutter.  MD planned cardioversion.  Pt approaching third week of hospitalization for acute hypoxic respiratory failure due to pneumonia?, suspicion Amiodarone lung toxicity, a flutter/RVR, hypotension.  DCCV 10/28, 11/3, 11/13, remains in NSR today.  T. Bili 1.1..  1.3.  Alk phos 101..  180.  AST/ALT 21/27.. 544/1755 over the course of 2 weeks. INR 1.2.. 2.9.    Hgb 8.4, MCV 104.  WBCs rising, 12.2.. 24.7.  Platelets 289.    With the rising LFTs, and concern for amio induced lung toxicity, amiodarone discontinued after 11/3.  GI symptoms consist of constipation new for this admission.  Sense of bloating and fullness with subsequent decreased appetite and p.o. intake.  No abdominal pain. No N/V.  No pruritus.    Also relays that he is essentially anuric which was not the case before he was admitted.     Family history negative for liver disease, gallbladder issues, colorectal or gastric neoplasia. Social history.  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  No alcohol, no illicit/recreational drugs.  Quit smoking around 2019.      Past Medical History:  Diagnosis Date   Acute on chronic respiratory failure with hypoxia ([place]) 04/21/2021   Acute on chronic systolic (congestive) heart failure ([name]) 02/26/2020   Biventricular congestive heart failure ([place])    Last Echo 11/2019 at [facility] reveals EF 20%   Class 3 severe obesity due to excess calories with serious comorbidity and body mass index (BMI) of 50.0 to 59.9 in adult [facility]) 02/26/2020   Essential hypertension 02/26/2020   GERD without esophagitis 02/26/2020   Hidradenitis suppurativa 02/26/2020   OSA (obstructive sleep apnea) 02/26/2020   Prediabetes 02/26/2020   Renal disorder     Past Surgical History:  Procedure Laterality Date   ABSCESS DRAINAGE  AV FISTULA PLACEMENT Left 08/21/2021   Procedure: LEFT ARM ARTERIOVENOUS (AV) FISTULA.;  Surgeon: Serafina Mitchell, MD;  Location: [place];  Service: Vascular;  Laterality: Left;   CARDIOVERSION N/A 10/09/2021   Procedure: CARDIOVERSION;  Surgeon: Larey Dresser, MD;  Location: [facility] ENDOSCOPY;  Service: Cardiovascular;  Laterality: N/A;   CARDIOVERSION N/A 05/28/2022   Procedure: CARDIOVERSION;  Surgeon: Larey Dresser, MD;  Location: [facility] ENDOSCOPY;  Service: Cardiovascular;  Laterality: N/A;   CARDIOVERSION N/A 06/07/2022   Procedure:  CARDIOVERSION;  Surgeon: Larey Dresser, MD;  Location: [place];  Service: Cardiovascular;  Laterality: N/A;   IR FLUORO GUIDE CV LINE RIGHT  03/10/2021   IR FLUORO GUIDE CV LINE RIGHT  04/22/2021   IR FLUORO GUIDE CV LINE RIGHT  08/20/2021   IR US GUIDE VASC ACCESS RIGHT  03/10/2021   IR US GUIDE VASC ACCESS RIGHT  04/22/2021   RIGHT HEART CATH N/A 03/06/2021   Procedure: RIGHT HEART CATH;  Surgeon: Jolaine Artist, MD;  Location: [place] CV LAB;  Service: Cardiovascular;  Laterality: N/A;   RIGHT/LEFT HEART CATH AND CORONARY ANGIOGRAPHY N/A 03/04/2020   Procedure: RIGHT/LEFT HEART CATH AND CORONARY ANGIOGRAPHY;  Surgeon: Larey Dresser, MD;  Location: [place] CV LAB;  Service: Cardiovascular;  Laterality: N/A;   TEE WITHOUT CARDIOVERSION N/A 05/05/2021   Procedure: TRANSESOPHAGEAL ECHOCARDIOGRAM (TEE);  Surgeon: Larey Dresser, MD;  Location[facility] ENDOSCOPY;  Service: Cardiovascular;  Laterality: N/A;   TEMPORARY DIALYSIS CATHETER  03/06/2021   Procedure: TEMPORARY DIALYSIS CATHETER;  Surgeon: Jolaine Artist, MD;  Location: [place] CV LAB;  Service: Cardiovascular;;    Prior to Admission medications   Medication Sig Start Date End Date Taking? Authorizing Provider  amiodarone (PACERONE) 200 MG tablet Take 2 tablets (400 mg total) TWICE daily for 2 weeks, then take 1 tablet (200 mg total) TWICE daily for 2 weeks, then take 1 tablet (200 mg total) ONCE daily 05/20/22  Yes Camnitz, Will Hassell Done, MD  apixaban (ELIQUIS) 5 MG TABS tablet Take 1 tablet (5 mg total) by mouth 2 (two) times daily. 12/16/21  Yes Larey Dresser, MD  diclofenac Sodium (VOLTAREN) 1 % GEL Apply 4 g topically 3 (three) times daily as needed (joint pains). 03/28/22  Yes Lacinda Axon, MD  lanthanum (FOSRENOL) 1000 MG chewable tablet Chew 2,000 mg by mouth 3 (three) times daily with meals.   Yes [provider]  Menthol (HALLS COUGH DROPS) 5.8 MG LOZG Use as directed 1 lozenge in the mouth or  throat every 4 (four) hours as needed (cough).   Yes [provider]    Scheduled Meds:  (feeding supplement) PROSource Plus  30 mL Oral BID BM   apixaban  5 mg Oral BID   Chlorhexidine Gluconate Cloth  6 each Topical Q0600   darbepoetin (ARANESP) injection - DIALYSIS  200 mcg Subcutaneous Q Sat-1800   fluticasone furoate-vilanterol  1 puff Inhalation Daily   lanthanum  2,000 mg Oral TID WC   melatonin  5 mg Oral QHS   metoprolol succinate  25 mg Oral BID   midodrine  5 mg Oral TID WC   pantoprazole  40 mg Oral Daily   predniSONE  40 mg Oral Q breakfast   [START ON 06/09/2022] torsemide  100 mg Oral Once per day on Sun Wed Fri   Infusions:  anticoagulant sodium citrate     PRN Meds: acetaminophen **OR** acetaminophen, alteplase, anticoagulant sodium citrate, guaiFENesin-dextromethorphan, ondansetron, polyethylene glycol,  senna-docusate   Allergies as of 05/22/2022 - Review Complete 05/22/2022  Allergen Reaction Noted   Coreg [carvedilol] Shortness Of Breath and Diarrhea 07/07/2020   Heparin Other (See Comments) 03/05/2021    Family History  Problem Relation Age of Onset   Heart disease Mother    Hypertension Mother    Pulmonary Hypertension Mother    Drug abuse Father        died due to Heroin overdose    Social History   Socioeconomic History   Marital status: Single    Spouse name: Not on file   Number of children: Not on file   Years of education: Not on file   Highest education level: Not on file  Occupational History   Not on file  Tobacco Use   Smoking status: Former    Packs/day: 1.00    Types: Cigarettes    Quit date: 2019    Years since quitting: 4.8    Passive exposure: Current   Smokeless tobacco: Former   Tobacco comments:    quit in 2019  Vaping Use   Vaping Use: Never used  Substance and Sexual Activity   Alcohol use: Never   Drug use: Never   Sexual activity: Not on file  Other Topics Concern   Not on file  Social History  Narrative   Not on file   Social Determinants of Health   Financial Resource Strain: Low Risk  (04/13/2021)   Overall Financial Resource Strain (CARDIA)    Difficulty of Paying Living Expenses: Not very hard  Food Insecurity: No Food Insecurity (05/23/2022)   Hunger Vital Sign    Worried About Running Out of Food in the Last Year: Never true    Ran Out of Food in the Last Year: Never true  Transportation Needs: No Transportation Needs (05/23/2022)   PRAPARE - Hydrologist (Medical): No    Lack of Transportation (Non-Medical): No  Physical Activity: Not on file  Stress: Not on file  Social Connections: Not on file  Intimate Partner Violence: Not At Risk (05/23/2022)   Humiliation, Afraid, Rape, and Kick questionnaire    Fear of Current or Ex-Partner: No    Emotionally Abused: No    Physically Abused: No    Sexually Abused: No    REVIEW OF SYSTEMS: Constitutional: Phillip Young, weakness. ENT:  No nose bleeds Pulm: Dyspnea.  Able to lie flat only when the BiPAP machine is in place. CV: No angina GU: Oliguria GI: See HPI.  No dysphagia.  No heartburn.  No abdominal pain.  No nausea, vomiting. Heme: No unusual bleeding or excessive bruising Transfusions: No transfusion with blood products. Neuro:  No headaches, no peripheral tingling or numbness no syncope, no seizures. Derm:  No itching, no rash or sores.  Endocrine:  No sweats or chills.  No polyuria or dysuria Immunization: Reviewed. Travel:  Not queried.   PHYSICAL EXAM: Vital signs in last 24 hours: Vitals:   06/08/22 0900 06/08/22 1234  BP:  112/84  Pulse:  99  Resp:  (!) 21  Temp:  98.2 F (36.8 C)  SpO2: 100% 100%   Wt Readings from Last 3 Encounters:  06/07/22 (!) 162.5 kg  05/20/22 (!) 181.9 kg  05/18/22 (!) 182.1 kg    General: Superobese.  Looks moderately chronically ill.  Sitting up Head: No facial asymmetry or signs of head trauma. Eyes: No scleral icterus. Ears: Not hard  of hearing Nose: No discharge Mouth: Tongue midline.  Mucosa moist, pink, clear. Neck: Obese.  Do not appreciate JVD. Lungs: Overall diminished breath sounds but clear.  Dyspnea with speech. Heart: RRR.  S1, S2 present.  No MRG. Abdomen: Obese, soft.  No tenderness.  No organomegaly, bruits, hernias.  Positive striae.   Rectal: Deferred Musc/Skeltl: No joint redness, swelling or gross deformity Extremities: No CCE. Neurologic: Oriented x3.  Moves all 4 limbs without tremor, strength not formally tested.  No gross deficits. Skin: Striae on the abdomen.   Psych: Cooperative, affect WNL.  Intake/Output from previous day: 11/13 0701 - 11/14 0700 In: 248.7 [P.O.:240; I.V.:8.7] Out: 3500  Intake/Output this shift: No intake/output data recorded.  LAB RESULTS: Recent Labs    06/06/22 0313 06/07/22 1432 06/08/22 0327  WBC 21.5*  --  24.7*  HGB 8.2* 9.9* 8.4*  HCT 23.8* 29.0* 24.2*  PLT 292  --  289   BMET Lab Results  Component Value Date   NA 131 (L) 06/08/2022   NA 131 (L) 06/07/2022   NA 131 (L) 06/06/2022   K 4.9 06/08/2022   K 5.1 06/07/2022   K 4.3 06/06/2022   CL 93 (L) 06/08/2022   CL 94 (L) 06/07/2022   CL 92 (L) 06/06/2022   CO2 19 (L) 06/08/2022   CO2 18 (L) 06/06/2022   CO2 21 (L) 06/05/2022   GLUCOSE 126 (H) 06/08/2022   GLUCOSE 95 06/07/2022   GLUCOSE 144 (H) 06/06/2022   BUN 74 (H) 06/08/2022   BUN 61 (H) 06/07/2022   BUN 65 (H) 06/06/2022   CREATININE 9.32 (H) 06/08/2022   CREATININE 7.40 (H) 06/07/2022   CREATININE 8.40 (H) 06/06/2022   CALCIUM 7.4 (L) 06/08/2022   CALCIUM 7.6 (L) 06/06/2022   CALCIUM 8.1 (L) 06/05/2022   LFT Recent Labs    06/08/22 0327  PROT 8.0  ALBUMIN 3.1*  AST 544*  ALT 1,755*  ALKPHOS 180*  BILITOT 1.3*   PT/INR Lab Results  Component Value Date   INR 2.9 (H) 06/07/2022   INR 1.2 03/26/2022   INR 1.5 (H) 08/19/2021   Hepatitis Panel No results for input(s): "HEPBSAG", "HCVAB", "HEPAIGM", "HEPBIGM" in the  last 72 hours. C-Diff No components found for: "CDIFF" Lipase     Component Value Date/Time   LIPASE 38 08/18/2021 1132    Drugs of Abuse  No results found for: "LABOPIA", "COCAINSCRNUR", "LABBENZ", "AMPHETMU", "THCU", "LABBARB"   RADIOLOGY STUDIES: DG Chest 1 View  Result Date: 06/07/2022 CLINICAL DATA:  Shortness of breath and productive cough. EXAM: CHEST  1 VIEW COMPARISON:  Chest radiographs 05/26/2022 FINDINGS: A right jugular dialysis catheter remains in place, terminating over the right atrium. The cardiac silhouette remains moderately to prominently enlarged. There is likely mild persistent pulmonary vascular congestion. No definite airspace consolidation, sizeable pleural effusion, or pneumothorax is identified within limitations of portable technique, cardiomegaly, and patient body habitus. IMPRESSION: Cardiomegaly and mild pulmonary vascular congestion. Electronically Signed   By: Logan Bores M.D.   On: 06/07/2022 16:33    ENDOSCOPIC STUDIES: None ever  IMPRESSION:   Acutely elevated LFTs.  LFTs were normal when he was admitted 2+ weeks ago.  At that point had been on amiodarone for 2 or 3 days.  Completed course Rocephin, Doxycylcline on 11/6.  LFTs were elevated, but not this high, in January/February 2023.  Did not undergo extensive testing then but ultrasound was negative for parenchymal liver disease, gallbladder or biliary duct issues and LFTs normalized after Amiodarone discontinued.  Suspect drug induced liver inury  from abx and/or shock liver.     Coagulopathy.  On Eliquis chronically.  INR was normal at arrival, now elevated.    Nonischemic cardiomyopathy.  Currently on 1 of multiple admissions for acute CHF flare, this time in setting of recurrent a flutter and multifocal pna.   CT chest shows consolidation and nodularity, multifocal PNA vs drug toxicity.  With elevated ESR, cards/pulmonary suspicion of amiodarone induced lung toxicity.  Completed a course of  antibiotics but CT chest and inflammatory markers have not improved.  Amiodarone now discontinued..  Atrial flutter/fibrillation.  Multiple DCCV's in the past and to this admission, currently 1 day post 3rd cardioversionsince 10/28 and maintaining sinus rhythm.  Continues Eliquis.    ESRD.  TTE as HD scheduled.  Chronic anemia of kidney disease.  Receives Epogen and periodic parenteral iron as an outpatient.  No PRBC transfusion hx.     PLAN:       Give Vitamin K IV x 3 days.  Previous order for RUQ ultrasound noted.     Azucena Freed  06/08/2022, 1:10 PM Phone [phone]

## 2022-06-08 NOTE — Progress Notes (Signed)
Mobility Specialist Progress Note:   06/08/22 1300  Mobility  Activity Ambulated with assistance in hallway  Level of Assistance Standby assist, set-up cues, supervision of patient - no hands on  Assistive Device None  Distance Ambulated (ft) 200 ft  Activity Response Tolerated fair  Mobility Referral Yes  $Mobility charge 1 Mobility   Pt agreeable to mobility session. Required no physical assistance throughout. SpO2 >90% on 8LO2. Pt back sitting EOB with all needs met.   Nelta Numbers Mobility Specialist Please contact via SecureChat or  Rehab office at [phone]

## 2022-06-08 NOTE — TOC Initial Note (Signed)
Transition of Care [facility]) - Initial/Assessment Note    Patient Details  Name: Phillip Young MRN: [id number] Date of Birth: [birth year]  Transition of Care [facility]) CM/SW Contact:    Erenest Rasher, RN Phone Number: [phone] 06/08/2022, 3:48 PM  Clinical Narrative:                  HF TOC CM spoke to pt at bedside. Faxed paperwork to [employer] per pt request, auth form to disclose information. Pt is working getting his long-term disability. Explained he will need to follow up with [employer] rep to see if documents were received. Pt has oxygen and Trilogy at home. Will request meds come up from [place] at dc.     Expected Discharge Plan: Home/Self Care Barriers to Discharge: Continued Medical Work up   Patient Goals and CMS Choice Patient states their goals for this hospitalization and ongoing recovery are:: wants to get back to work      Expected Discharge Plan and Services Expected Discharge Plan: Home/Self Care   Discharge Planning Services: CM Consult   Living arrangements for the past 2 months: Single Family Home                   Prior Living Arrangements/Services Living arrangements for the past 2 months: Single Family Home Lives with:: Parents Patient language and need for interpreter reviewed:: Yes Do you feel safe going back to the place where you live?: Yes      Need for Family Participation in Patient Care: No (Comment) Care giver support system in place?: Yes (comment) Current home services: DME ([employer]), oxygen ([place])) Criminal Activity/Legal Involvement Pertinent to Current Situation/Hospitalization: No - Comment as needed  Activities of Daily Living Home Assistive Devices/Equipment: BIPAP ADL Screening (condition at time of admission) Patient's cognitive ability adequate to safely complete daily activities?: Yes Is the patient deaf or have difficulty hearing?: No Does the patient have difficulty seeing, even when wearing  glasses/contacts?: No Does the patient have difficulty concentrating, remembering, or making decisions?: No Patient able to express need for assistance with ADLs?: No Does the patient have difficulty dressing or bathing?: No Independently performs ADLs?: Yes (appropriate for developmental age) Does the patient have difficulty walking or climbing stairs?: No Weakness of Legs: None Weakness of Arms/Hands: None  Permission Sought/Granted Permission sought to share information with : Case Manager, Family Supports, PCP Permission granted to share information with : Yes, Verbal Permission Granted  Share Information with NAME: Phillip Young     Permission granted to share info w Relationship: mother  Permission granted to share info w Contact Information: [phone]  Emotional Assessment Appearance:: Appears stated age Attitude/Demeanor/Rapport: Gracious Affect (typically observed): Accepting Orientation: : Oriented to Self, Oriented to Place, Oriented to  Time, Oriented to Situation   Psych Involvement: No (comment)  Admission diagnosis:  Typical atrial flutter ([name]) [I48.3] Acute on chronic respiratory failure with hypoxia ([place]) [J96.21] Acute hypoxic respiratory failure ([place]) [J96.01] Patient Active Problem List   Diagnosis Date Noted   Elevated sed rate (elev SR) 06/01/2022   Elevated C-reactive protein 06/01/2022   Acute on chronic respiratory failure with hypoxia ([place]) 06/01/2022   Multiple nodules of lung 05/31/2022   Acute hypoxic respiratory failure ([place]) 05/22/2022   Hospital discharge follow-up 04/09/2022   Goals of care, counseling/discussion    Recurrent infections 09/30/2021   Other fatigue 09/30/2021   Pruritic rash 09/17/2021   Reactive depression 09/02/2021   Hypoxemia 09/02/2021  Abscess of iliac fossa 08/26/2021   Staphylococcal arthritis ([place]) 08/26/2021   Healthcare maintenance 08/26/2021   Sacral osteomyelitis ([place]) 08/26/2021   Obesity 08/26/2021    Volume overload 08/18/2021   Palliative care by specialist    Full code status    Concern about end of life    Medical non-compliance 04/21/2021   Hypotension 04/21/2021   MSSA bacteremia    ESRD (end stage renal disease) on dialysis ([place]) 04/16/2021   Paroxysmal atrial flutter ([place]) 04/12/2021   Acute respiratory failure with hypoxia ([place]) 04/12/2021   Hyponatremia 04/12/2021   Dyspnea    Typical atrial flutter ([place]) 02/11/2021   Elevated troponin 02/11/2021   Chronic respiratory failure with hypoxia ([place]) 02/11/2021   CAP (community acquired pneumonia) 98/26/4158   Chronic systolic CHF (congestive heart failure) ([place])    Hypokalemia    Chronic cough    Morbid obesity ([place]) 02/26/2020   GERD without esophagitis 02/26/2020   Diuretic-induced hypokalemia 02/26/2020   OSA (obstructive sleep apnea) 02/26/2020   Hidradenitis suppurativa 02/26/2020   Prediabetes 02/26/2020   PCP:  [name], MD Pharmacy:   [facility] [phone] - [place], [place] - [address] AT [address] [place] [address] Phone: [phone] Fax: [phone]     Social Determinants of Health (SDOH) Interventions Housing Interventions: Intervention Not Indicated  Readmission Risk Interventions    05/12/2021   10:35 AM 10/16/2020    2:10 PM 10/16/2020    1:57 PM  Readmission Risk Prevention Plan  Transportation Screening Complete Complete Complete  PCP or Specialist Appt within 5-7 Days  Complete Complete  Home Care Screening  Complete   Medication Review (RN CM)  Referral to Pharmacy   Medication Review (RN Care Manager) Complete    PCP or Specialist appointment within 3-5 days of discharge Complete    HRI or [place] Complete    SW Recovery Care/Counseling Consult Complete    Palliative Care Screening Complete    Skilled Nursing Facility Complete

## 2022-06-08 NOTE — Progress Notes (Signed)
Unit RN reported that pt called out stating his IV that was just placed a coupled of hours ago was coming out. Pt skin is very oily. Prior to insertion of IV RFA was wiped down with alcohol, then cleaned with chlorhexidine and skin was prepped with Cavilon prior to securing with lg tegaderm. Site was wrapped with gauze wrap. Pt seems to be very restless and complains of itching all over. Unit RN made aware of this

## 2022-06-08 NOTE — Progress Notes (Signed)
RT to bedside to assess current bipap settings., no changes in setting needed per RT

## 2022-06-08 NOTE — Progress Notes (Signed)
Pt sbp 90s sitting on side of bed with feet dangled to floor. Encouraged pt to lye supine with fob elevated, pt refusing asking for time to stabilize breathing and coughing, pt remains in sitting position for 15 minutes. This rn informed [place], [place], PA to bedside and and informed of pt sbp 90s, PA encouraged and informed pt of safety risk d/t bp. Pt in sitting position for 30 minutes before putting legs up in bed. Pt placed self on bipap machine.

## 2022-06-09 ENCOUNTER — Telehealth: Payer: Self-pay | Admitting: Physician Assistant

## 2022-06-09 ENCOUNTER — Inpatient Hospital Stay (HOSPITAL_COMMUNITY): Payer: BC Managed Care – PPO

## 2022-06-09 DIAGNOSIS — J9601 Acute respiratory failure with hypoxia: Secondary | ICD-10-CM | POA: Diagnosis not present

## 2022-06-09 LAB — CBC
HCT: 25.5 % — ABNORMAL LOW (ref 39.0–52.0)
Hemoglobin: 9 g/dL — ABNORMAL LOW (ref 13.0–17.0)
MCH: 36.6 pg — ABNORMAL HIGH (ref 26.0–34.0)
MCHC: 35.3 g/dL (ref 30.0–36.0)
MCV: 103.7 fL — ABNORMAL HIGH (ref 80.0–100.0)
Platelets: 274 10*3/uL (ref 150–400)
RBC: 2.46 MIL/uL — ABNORMAL LOW (ref 4.22–5.81)
RDW: 16.5 % — ABNORMAL HIGH (ref 11.5–15.5)
WBC: 20.6 10*3/uL — ABNORMAL HIGH (ref 4.0–10.5)
nRBC: 1.3 % — ABNORMAL HIGH (ref 0.0–0.2)

## 2022-06-09 LAB — COMPREHENSIVE METABOLIC PANEL
ALT: 1333 U/L — ABNORMAL HIGH (ref 0–44)
AST: 285 U/L — ABNORMAL HIGH (ref 15–41)
Albumin: 3.1 g/dL — ABNORMAL LOW (ref 3.5–5.0)
Alkaline Phosphatase: 244 U/L — ABNORMAL HIGH (ref 38–126)
Anion gap: 16 — ABNORMAL HIGH (ref 5–15)
BUN: 58 mg/dL — ABNORMAL HIGH (ref 6–20)
CO2: 23 mmol/L (ref 22–32)
Calcium: 7.6 mg/dL — ABNORMAL LOW (ref 8.9–10.3)
Chloride: 94 mmol/L — ABNORMAL LOW (ref 98–111)
Creatinine, Ser: 7.79 mg/dL — ABNORMAL HIGH (ref 0.61–1.24)
GFR, Estimated: 8 mL/min — ABNORMAL LOW (ref >60)
Glucose, Bld: 112 mg/dL — ABNORMAL HIGH (ref 70–99)
Potassium: 4.2 mmol/L (ref 3.5–5.1)
Sodium: 133 mmol/L — ABNORMAL LOW (ref 135–145)
Total Bilirubin: 1.4 mg/dL — ABNORMAL HIGH (ref 0.3–1.2)
Total Protein: 7.9 g/dL (ref 6.5–8.1)

## 2022-06-09 LAB — PROTIME-INR
INR: 2.8 — ABNORMAL HIGH (ref 0.8–1.2)
Prothrombin Time: 29.2 seconds — ABNORMAL HIGH (ref 11.4–15.2)

## 2022-06-09 NOTE — Discharge Summary (Signed)
Name: Phillip Young MRN: [id number] DOB: 06-Apr-1986 36 y.o. PCP: [name], MD  Date of Admission: 05/22/2022  6:56 AM Date of Discharge: No discharge date for patient encounter. Attending Physician: Aldine Contes, MD  Discharge Diagnosis: Principal Problem:   Acute hypoxic respiratory failure ([place]) Active Problems:   OSA (obstructive sleep apnea)   Chronic systolic CHF (congestive heart failure) ([name])   Typical atrial flutter ([name])   Paroxysmal atrial flutter ([name])   Acute respiratory failure with hypoxia ([name])   ESRD (end stage renal disease) on dialysis ([name])   Hypotension   Multiple nodules of lung   Elevated sed rate (elev SR)   Elevated C-reactive protein   Acute on chronic respiratory failure with hypoxia ([name])   Elevated liver enzymes     Discharge Medications: Allergies as of 06/09/2022       Reactions   Coreg [carvedilol] Shortness Of Breath, Diarrhea   Wheezing    Heparin Other (See Comments)   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, SRA positive   Amiodarone Other (See Comments)   Suspicion for amiodarone lung/hepatotoxicity     Med Rec must be completed prior to using this [facility]***        Durable Medical Equipment  (From admission, onward)           Start     Ordered   06/09/22 1401  For home use only DME oxygen  Once       Question Answer Comment  Length of Need Lifetime   Mode or (Route) Nasal cannula   Liters per Minute 6   Frequency Continuous (stationary and portable oxygen unit needed)   Oxygen conserving device Yes   Oxygen delivery system Gas      06/09/22 1401             Disposition and follow-up:    [name] was discharged from [facility] in Stable condition.  At the hospital follow up visit please address:   1.  Ensure patient's breath shortness has improved and that he is taking his medications as prescribed including supplemental oxygen, nightly BiPAP, and prednisone  which should taper off completely by 12-28  2.  Check adherence and tolerance of four-day per week hemodialysis schedule  3. Labs / imaging needed at time of follow-up: ECG, LFTs  4.  Pending labs / tests needing follow-up: none    Follow-up Appointments:  Follow-up Information     Katsadouros, Vasilios, MD Follow up in 5 day(s).   Specialty: Internal Medicine Why: Hospital follow up Contact information: [place] [address] [phone]                [facility] on 12-12    Hospital Course by problem list:  xxxx   Discharge Exam:    BP (!) 101/94 (BP Location: Left Arm)   Pulse 92   Temp 98.1 F (36.7 C) (Oral)   Resp 18   Ht 6' (1.829 m)   Wt (!) 181.7 kg   SpO2 100%   BMI 54.33 kg/m   Subjective: Patient reports ***  ***   Pertinent Labs, Studies, and Procedures:   Labs: *** ______________________  Imaging: ***  ______________________  Procedures: ***  ______________________   Discharge Instructions:  [name],  It was a pleasure to take care of you while you were in the hospital. Your breath shortness was caused by several conditions including pneumonia, fluid overload from heart failure, and toxicity from amiodarone. We gave  you supplemental oxygen as needed and a course of steroids, which you will continue to take at home for six weeks until December 28th:  CONTINUE: Prednisone 40mg  daily for two weeks THEN: Prednisone 30mg  daily for two weeks FINALLY: Prednisone 20mg  daily for two weeks  We are also sending you home with some oxygen because your oxygen requirements have increased. Please take this oxygen as needed throughout the day so that you can avoid feeling short of breath.   While you were here, your heart rhythm became irregular and this caused some liver damage. We controlled your heart rate with metoprolol and your blood pressure with midodrine. After three cardioversion procedures your heart  rhythm returned to normal. Eventually, your heart may start to beat irregularly again. If this happens, please return to the emergency department. You may also want to consider visiting another hospital for a second opinion about additional treatment options.  Please continue to take the rest of your regular medications as prescribed. We are sending you home with a few new medications including metoprolol and midodrine. In addition, you will now need to receive hemodialysis four days each week on a Mo-Tu-Th-Sa schedule.  Finally, we have arranged a follow-up visit with our Internal Medicine Clinic for 11-20.   Signed: [place] Nickel, MD Internal Medicine PGY-1 Pager [phone]

## 2022-06-09 NOTE — Progress Notes (Signed)
Daily Rounding Note  06/09/2022, 12:20 PM  LOS: 17 days   SUBJECTIVE:   Chief complaint:   elevated LFTs   Patient is eating very well.  Had a large bowel movement this morning.  No GI complaints.  OBJECTIVE:         Vital signs in last 24 hours:    Temp:  [98 F (36.7 C)-98.7 F (37.1 C)] 98.3 F (36.8 C) (11/15 0831) Pulse Rate:  [90-99] 92 (11/15 1205) Resp:  [20-28] 20 (11/15 0419) BP: (90-117)/(55-94) 101/94 (11/15 1205) SpO2:  [100 %] 100 % (11/15 0419) Weight:  [181.7 kg] 181.7 kg (11/14 1445) Last BM Date : 06/05/22 Filed Weights   06/07/22 1000 06/07/22 1410 06/08/22 1445  Weight: (!) 166 kg (!) 162.5 kg (!) 181.7 kg   General: Obese.  Comfortable. Heart: NSR on the telemetry monitor. Chest: Diminished breath sounds but clear.  Some labored breathing at rest and with speech. Abdomen: Obese, soft.  Not tender.  Active bowel sounds Extremities: No CCE. Neuro/Psych: Appropriate.  Fluid speech.  No tremors or gross deficits.  Fully alert and oriented.  Pleasant affect  Intake/Output from previous day: 11/14 0701 - 11/15 0700 In: -  Out: 3   Intake/Output this shift: No intake/output data recorded.  Lab Results: Recent Labs    06/07/22 1432 06/08/22 0327 06/09/22 0512  WBC  --  24.7* 20.6*  HGB 9.9* 8.4* 9.0*  HCT 29.0* 24.2* 25.5*  PLT  --  289 274   BMET Recent Labs    06/07/22 1432 06/08/22 0327 06/09/22 0512  NA 131* 131* 133*  K 5.1 4.9 4.2  CL 94* 93* 94*  CO2  --  19* 23  GLUCOSE 95 126* 112*  BUN 61* 74* 58*  CREATININE 7.40* 9.32* 7.79*  CALCIUM  --  7.4* 7.6*   LFT Recent Labs    06/08/22 0327 06/09/22 0512  PROT 8.0 7.9  ALBUMIN 3.1* 3.1*  AST 544* 285*  ALT 1,755* 1,333*  ALKPHOS 180* 244*  BILITOT 1.3* 1.4*   PT/INR Recent Labs    06/07/22 1655 06/09/22 0512  LABPROT 29.7* 29.2*  INR 2.9* 2.8*   Hepatitis Panel No results for input(s): "HEPBSAG",  "HCVAB", "HEPAIGM", "HEPBIGM" in the last 72 hours.  Studies/Results: US Abdomen Limited RUQ (LIVER/GB)  Result Date: 06/09/2022 CLINICAL DATA:  Elevated LFTs EXAM: ULTRASOUND ABDOMEN LIMITED RIGHT UPPER QUADRANT COMPARISON:  Ultrasound 08/20/2021, CT 05/31/2022. FINDINGS: Gallbladder: No gallstones or wall thickening visualized. No sonographic Murphy sign noted by sonographer. Common bile duct: Diameter: 5.2 mm, normal.  No intrahepatic ductal dilation. Liver: Suboptimal views of the liver due to body habitus. No obvious liver abnormality. Portal vein is patent on color Doppler imaging with normal direction of blood flow towards the liver. Other: Suboptimal exam due to body habitus. IMPRESSION: Suboptimal exam due to body habitus. No evidence of cholecystitis or biliary obstruction. No obvious liver abnormality on suboptimal views. Electronically Signed   By: Maurine Simmering M.D.   On: 06/09/2022 09:21   DG Chest 1 View  Result Date: 06/07/2022 CLINICAL DATA:  Shortness of breath and productive cough. EXAM: CHEST  1 VIEW COMPARISON:  Chest radiographs 05/26/2022 FINDINGS: A right jugular dialysis catheter remains in place, terminating over the right atrium. The cardiac silhouette remains moderately to prominently enlarged. There is likely mild persistent pulmonary vascular congestion. No definite airspace consolidation, sizeable pleural effusion, or pneumothorax is identified within limitations of portable technique,  cardiomegaly, and patient body habitus. IMPRESSION: Cardiomegaly and mild pulmonary vascular congestion. Electronically Signed   By: Logan Bores M.D.   On: 06/07/2022 16:33    Scheduled Meds:  (feeding supplement) PROSource Plus  30 mL Oral BID BM   apixaban  5 mg Oral BID   Chlorhexidine Gluconate Cloth  6 each Topical Q0600   darbepoetin (ARANESP) injection - DIALYSIS  200 mcg Subcutaneous Q Sat-1800   fluticasone furoate-vilanterol  1 puff Inhalation Daily   lanthanum  2,000 mg Oral  TID WC   melatonin  5 mg Oral QHS   metoprolol succinate  25 mg Oral BID   midodrine  5 mg Oral TID WC   pantoprazole  40 mg Oral Daily   predniSONE  40 mg Oral Q breakfast   torsemide  100 mg Oral Once per day on Sun Wed Fri   Continuous Infusions:  phytonadione (VITAMIN K) 5 mg in dextrose 5 % 50 mL IVPB 5 mg (06/09/22 1211)   PRN Meds:.acetaminophen **OR** acetaminophen, guaiFENesin-dextromethorphan, ondansetron, polyethylene glycol, senna-docusate   ASSESMENT:   Acutely elevated LFTs.  Suspect due to DILI from recent antibiotics and shock liver in setting of atrial tacky arrhythmias.  Amiodarone discontinued after 11/3.  06/09/2022 abdominal ultrasound was suboptimal due to obesity but there was no evidence for cholecystitis, biliary obstruction, obvious changes in the liver or portal vein thrombosis.  Today's transaminases improved but still significantly elevated.  T. bili slightly increased.  Alk phos also increased.  Atrial fibrillation/flutter.  3 cardioversions in the last 3 weeks, latest was 11/13.  Chronic Eliquis  Nonischemic cardiomyopathy, LVEF 20 to 25%.     ESRD.  HD TTS.  Anemia of kidney disease.  Receives regular Aranesp and previous Ferrlecit.   PLAN     Follow daily LFTs.      Azucena Freed  06/09/2022, 12:20 PM Phone [phone]

## 2022-06-09 NOTE — Progress Notes (Signed)
SATURATION QUALIFICATIONS: (This note is used to comply with regulatory documentation for home oxygen)  Patient Saturations on Room Air at Rest = 91%  Patient Saturations on Room Air while Ambulating = 82%  Patient Saturations on 6 Liters of oxygen while Ambulating = 91%  Pt requires 6L oxygen to maintain SpO2 above 88% while ambulating

## 2022-06-09 NOTE — Progress Notes (Signed)
Subjective:  Patient feeling good today, much better than days past and eager for discharge. Breathing well on 4L/min supplemental oxygen. Discussed plan for liver ultrasound and encouraged continued fluid restriction including ginger ale.   Objective: Vitals over previous 24hr: Vitals:   06/08/22 2359 06/09/22 0419 06/09/22 0831 06/09/22 1205  BP: 117/78 117/80  (!) 101/94  Pulse: 90 95  92  Resp: 20 20  18   Temp: 98 F (36.7 C) 98.2 F (36.8 C) 98.3 F (36.8 C) 98.1 F (36.7 C)  TempSrc: Oral Oral Oral Oral  SpO2: 100% 100%  100%  Weight:      Height:        General:      awake and alert, sitting on bed, cooperative, not in acute distress Skin:       warm and dry, intact without any obvious lesions or scars, no rashes Head:      normocephalic and atraumatic, oral mucosa moist Lungs:      normal respiratory effort, breathing slightly labored, symmetrical chest rise, mildly decreased sounds in lower fields but generally difficult to appreciate given body habitus, no crackles or wheezing Cardiac:      regular rate and rhythm, normal S1 and S2, no murmurs Neurologic:      oriented to person-place-time, moving all extremities, no gross focal deficits Psychiatric:      euthymic mood with appropriate affect, intelligible speech    Assessment/Plan: Phillip Young is a 36 year old male with a past medical history of morbid obesity, HFrEF, OSA, OHS, AFib-AFlutter who presented with breath shortness, lightheadedness, and palpitations, now admitted for chronic hypoxic respiratory failure and atrial fibrillation.    ---Acute on chronic hypoxic respiratory failure ---Amiodarone toxicity ---Community acquired pneumonia ---Obstructive sleep apnea on nightly BiPAP ---Obesity hypoventilation syndrome Patient presented on 10-28 with breath shortness, found to have atrial flutter rapid ventricular response and hypotension. He was started amiodarone for atrial fibrillation on 10-26,  which was subsequently discontinued upon arrival. Chest radiograph revealed evidence of pulmonary venous congestion and CT showed bilateral consolidations concerning for possible pneumonia. Five-day course of ceftriaxone and doxycycline were completed on 11-6, after which repeat imaging failed to demonstrate any improvement. Volume status has been managed with hemodialysis throughout hospitalization and oxygen requirements have improved since admission. Inciting event for symptoms remains unclear, though given lack of improvement with antibiotic therapy, amiodarone toxicity is strong possibility. Course of prednisone was started 11-7, after which patient reports symptomatic improvement but continues to require ~4-6L/min of oxygen and will need supplementation at home upon discharge.  > Fluticasone-vilanterol 100-25ug q24 > Prednisone 40mg  q24 > Administer BiPAP nightly > Supplemental oxygen to maintain SpO2>92% > Volume management with hemodialysis > Trend CBC q24   ---End-stage renal disease on hemodialysis Patient missed hemodialysis session on 10-28 prior to ED arrival. Estimated dry weight around 175-180kg, most recently at 181.7kg. Volume overload likely contributing to respiratory failure, which has been managed with hemodialysis per his usual Tu-Th-Sa schedule. Given increasing need for frequent filtration and tighter volume control, Monday was indefinitely added to hemodialysis schedule during hospitalization. > Nephrology consult, appreciate recommendations > Hemodialysis on Mo-Tu-Th-Sa > Torsemide 100mg  on We-Fr-Su > Trend BMP q24   ---Heart failure with reduced ejection fraction ---Hypotension Echocardiogram performed 02-2022 demonstrated ejection fraction of 20-25% with history negative for an ischemic event. Patient presented with lightheadedness and breath shortness. Upon arrival, troponin levels were slightly elevated and BNP at baseline. Management of heart failure is limited due to  ESRD, hypotension requiring  midodrine, and morbid obesity status. Acute heart failure exacerbation likely contributing to patient's symptoms and volume status is currently managed with hemodialysis. > Cardiology consult, appreciate recommendations > Midodrine 5mg  q8, only if SBP>110 > Volume management with hemodialysis on Mo-Tu-Th-Sa > Torsemide 100mg  on We-Fr-Su   ---Atrial flutter post three cardioversions Patient presented on 10-28 with symptomatic tachyarrhythmia and hypotension, found to have atrial flutter plus rapid ventricular response. Atrial flutter initially resolved after cardioversion, but episodes returned despite another attempt. Internal medicine team is holding amiodarone given likely pulmonary toxicity. Given multiple chronic conditions, treatment modalities for his atrial fibrillation are very limited. Cardiology and electrophysiology teams are considering all potential options. Third cardioversion attempt completed on 11-13, patient has been in sinus tachycardia since this procedure. > Cardiology consult, appreciate recommendations > Metoprolol 25mg  q12 > Apixaban 5mg  q24 > Cardiac telemetry   ---Elevated transaminases Laboratory testing collected 11-14 revealed AST 544 and ALT 1755, both increased since prior study on 11-3, plus elevated PT-INR. Ultrasound of RUQ performed 07-2021 negative for parenchymal liver disease, repeat on 11-15 also unremarkable. Given underlying HFrEF with fluid overload and multi-day episode of atrial flutter, etiology is most likely hepatic injury secondary to cardiogenic hypoperfusion. Intravenous vitamin K therapy initiated on 11-14. > Phytonadione 5mg  q24 > Trend CMP q24   ---Iron deficiency anemia Laboratory testing in 03-2022 consistent with iron deficiency anemia and elevated MCV. Upon arrival, hemoglobin was 8.8 and has been stable since admission. Vitamin B9 and TSH levels were normal, B12 markedly elevated. > Consider ferrous sulfate  supplementation upon discharge     Principal Problem:   Acute hypoxic respiratory failure ([name]) Active Problems:   OSA (obstructive sleep apnea)   Chronic systolic CHF (congestive heart failure) ([name])   Typical atrial flutter ([name])   Paroxysmal atrial flutter ([name])   Acute respiratory failure with hypoxia ([name])   ESRD (end stage renal disease) on dialysis ([name])   Hypotension   Multiple nodules of lung   Elevated sed rate (elev SR)   Elevated C-reactive protein   Acute on chronic respiratory failure with hypoxia ([name])   Elevated liver enzymes    Prior to Admission Living Arrangement: home Anticipated Discharge Location: home Barriers to Discharge: medical management Dispo: Anticipated discharge in approximately 1 day(s).    Phillip Nickel, MD Internal Medicine PGY-1 Pager [phone]  After 5pm on weekdays and 1pm on weekends: On Call pager [phone]

## 2022-06-09 NOTE — Progress Notes (Addendum)
Patient ID: Phillip Young, male   DOB: [birth year], 36 y.o.   MRN: [id number]     Advanced Heart Failure Rounding Note  PCP-Cardiologist: [name], MD   Subjective:    Admitted w/ AFL w/ RVR + acute hypoxic respiratory failure due to PNA vs amiodarone lung toxicity.   CT chest: mass-like consolidations and clustered areas of nodularity bilaterally, multifocal PNA or drug toxicity.   10/28: emergent DCCV in ED>>NSR 11/2: reverted back to AFL w/ RVR,. PO amio discontinued. Started metoprolol 12.79m q6h. Abx started for PNA 11/3: DCCV to NSR 11/6: Back in AFL. Completed PNA coverage with ceftriaxone/doxycycline. Repeat ESR higher 113>>135  11/7: Started prednisone for possible amiodarone toxicity 11/13: DCCV>>NSR 11/14: marked elevation in transaminases. GI consulted  Remains in NSR this morning. HR 90s.   AST/ALT trending down AST 544>>285 ALT 1755>>1333  Alk Phos 180>>240  Tibili 1.3>>1.4  RUQ [place]   Feels breathing has improved. Continues on supp O2. No current complaints.     Objective:   Weight Range: (!) 181.7 kg Body mass index is 54.33 kg/m.   Vital Signs:   Temp:  [98 F (36.7 C)-98.7 F (37.1 C)] 98.2 F (36.8 C) (11/15 0419) Pulse Rate:  [90-99] 95 (11/15 0419) Resp:  [20-28] 20 (11/15 0419) BP: (90-117)/(55-88) 117/80 (11/15 0419) SpO2:  [100 %] 100 % (11/15 0419) Weight:  [181.7 kg] 181.7 kg (11/14 1445) Last BM Date : 06/05/22  Weight change: Filed Weights   06/07/22 1000 06/07/22 1410 06/08/22 1445  Weight: (!) 166 kg (!) 162.5 kg (!) 181.7 kg    Intake/Output:   Intake/Output Summary (Last 24 hours) at 06/09/2022 0746 Last data filed at 06/08/2022 1830 Gross per 24 hour  Intake --  Output 3 ml  Net -3 ml       Physical Exam    General:  fatigued appearing, sitting on side of bed. No respiratory difficulty HEENT: normal Neck: supple. Thick neck JVD not well visualized Carotids 2+ bilat; no bruits. No lymphadenopathy or  thyromegaly appreciated. Cor: PMI nondisplaced. Regular rate & rhythm. No rubs, gallops or murmurs. Lungs: clear Abdomen: obese ,soft, nontender, nondistended. No hepatosplenomegaly. No bruits or masses. Good bowel sounds. Extremities: no cyanosis, clubbing, rash, edema Neuro: alert & oriented x 3, cranial nerves grossly intact. moves all 4 extremities w/o difficulty. Affect pleasant.   Telemetry   NSR 90s  (personally reviewed)  Labs    CBC Recent Labs    06/08/22 0327 06/09/22 0512  WBC 24.7* 20.6*  HGB 8.4* 9.0*  HCT 24.2* 25.5*  MCV 104.3* 103.7*  PLT 289 2673  Basic Metabolic Panel Recent Labs    06/08/22 0327 06/09/22 0512  NA 131* 133*  K 4.9 4.2  CL 93* 94*  CO2 19* 23  GLUCOSE 126* 112*  BUN 74* 58*  CREATININE 9.32* 7.79*  CALCIUM 7.4* 7.6*   Liver Function Tests Recent Labs    06/08/22 0327 06/09/22 0512  AST 544* 285*  ALT 1,755* 1,333*  ALKPHOS 180* 244*  BILITOT 1.3* 1.4*  PROT 8.0 7.9  ALBUMIN 3.1* 3.1*     No results for input(s): "LIPASE", "AMYLASE" in the last 72 hours. Cardiac Enzymes No results for input(s): "CKTOTAL", "CKMB", "CKMBINDEX", "TROPONINI" in the last 72 hours.  BNP: BNP (last 3 results) Recent Labs    02/18/22 0154 03/26/22 1958 05/22/22 0732  BNP 373.8* 374.2* 373.1*    ProBNP (last 3 results) No results for input(s): "PROBNP" in the last 8760 hours.  D-Dimer No results for input(s): "DDIMER" in the last 72 hours. Hemoglobin A1C No results for input(s): "HGBA1C" in the last 72 hours. Fasting Lipid Panel No results for input(s): "CHOL", "HDL", "LDLCALC", "TRIG", "CHOLHDL", "LDLDIRECT" in the last 72 hours. Thyroid Function Tests No results for input(s): "TSH", "T4TOTAL", "T3FREE", "THYROIDAB" in the last 72 hours.  Invalid input(s): "FREET3"    Other results:   Imaging    No results found.   Medications:     Scheduled Medications:  (feeding supplement) PROSource Plus  30 mL Oral BID BM    apixaban  5 mg Oral BID   Chlorhexidine Gluconate Cloth  6 each Topical Q0600   darbepoetin (ARANESP) injection - DIALYSIS  200 mcg Subcutaneous Q Sat-1800   fluticasone furoate-vilanterol  1 puff Inhalation Daily   lanthanum  2,000 mg Oral TID WC   melatonin  5 mg Oral QHS   metoprolol succinate  25 mg Oral BID   midodrine  5 mg Oral TID WC   pantoprazole  40 mg Oral Daily   predniSONE  40 mg Oral Q breakfast   torsemide  100 mg Oral Once per day on Sun Wed Fri    Infusions:  phytonadione (VITAMIN K) 5 mg in dextrose 5 % 50 mL IVPB 5 mg (06/08/22 2135)     PRN Medications: acetaminophen **OR** acetaminophen, guaiFENesin-dextromethorphan, ondansetron, polyethylene glycol, senna-docusate    Patient Profile   36 y/o male w/ chronic biventricular heart failure due to NICM, PAF/AFL s/p multiple DCCVs, multiple intolerances to amiodarone, ESRD on iHD, OSA on CPAP and morbid obesity, admitted for acute hypoxic respiratory failure and hypotension in the setting of recurrent ALF w/ RVR, requiring emergent DCCV in ED. Now w/ concern for potential amiodarone pulmonary toxicity. Also being treated for possible PNA.   Assessment/Plan   1. Atrial flutter/fibrillation: History of multiple DCCVs. Amiodarone previously stopped due to elevated LFTs. Restarted 05/20/22 by Dr. Curt Bears.  Admitted w/ hemodynamically unstable AFL w/ RVR requiring emergent DCCV>>NSR in ER.  Atrial flutter recurred with RVR post-DCCV.  Now w/ concern for possible amiodarone pulmonary toxicity w/ elevated ESR 113 and abnormal high resolution chest CT showing mass-like consolidations and clustered areas of nodularity bilaterally, multifocal PNA or drug toxicity. Amiodarone stopped (he says breathing got worse after restarting on amiodarone). Required DCCV x 3 this admission.  I reviewed ECGs with EP, he has been in BOTH atrial flutter (typical and atypical) and fibrillation this admission. We have been struggling to rate  control him. He is in NSR this morning after last DCCV 11/13.  - Per Dr. Curt Bears, he is not a candidate for atrial flutter or fibrillation ablation.  Has multiple flutter circuits and also has had fibrillation this admission.  Would not ablate flutter alone without treating atrial fibrillation, and he would not be a candidate for atrial fibrillation ablation.  - Continue Toprol XL 25 mg bid, has not been able to tolerate higher doses.  - Continue midodrine 5 mg tid to maintain BP.   - Poor candidate for AV nodal ablation with BiV pacing due to ESRD and infection risk, EP has evaluated and deemed not a candidate.  - Not candidate for amiodarone (lung and hepatic toxicity) or Tikosyn (ESRD), no anti-arrhythmic option.   - Continue Eliquis 5 mg bid  2.  Chronic systolic CHF: Nonischemic CMP, LHC 2021 with no CAD. Previous hospitalizations w/shock in setting of rapid AFib/Flutter requiring milrinone. Most recent echo 8/23 with EF 20-25%, RV mildly reduced. ESRD, volume  controlled by HD.   - GDMT limited by CKD and hypotension requiring midodrine  - Not candidate for ICD given obesity and dialysis  - Not candidate for advanced therapies.  3. Acute Hypoxic Respiratory Failure: Combination of volume overload and multifocal PNA.  High resolution CT chest showed mass-like consolidations and clustered areas of nodularity bilaterally, multifocal PNA or drug toxicity. Unclear if lung opacifications are infectious or inflammatory. Repeat ESR higher 113>>135. Suspect amiodarone lung toxicity, pulmonary following.  He has completed a course of abx with no improvement in CT chest or inflammatory markers.  - Amiodarone has been stopped - Prednisone started for suspected inflammatory lung disease from amiodarone lung toxicity, will followup as outpatient with pulmonary. Does feel improvement with prednisone.  - Still req  6-8L [place].  4. ESRD: HD per nephrology, on T,Th,Sat schedule.   - Volume improved, s/p iHD  yesterday 5. Obesity: Body mass index is 55.64 kg/m.  - wt loss advised 6. Fe deficiency anemia.  - T-sat < 20%. Received Ferrlecit per primary team  7. OSA: Continue CPAP QHS 8. Elevated Transaminases:  Last LFTs on 05/28/22 were normal.  Unlikely that this is due to amiodarone as admission LFTs were normal.  Possible rise due to shock liver physiology with cardiomyopathy and volume overload + persistent atrial arrhythmias vs drug-induced liver injury (antibiotics from recent pneumonia treatment)  - AST/ALT starting to trend down  - GI following - obtain RUQ [place]   Length of Stay: [address] Ladoris Gene  06/09/2022 7:46 AM  Patient seen with PA, agree with the above note.   LFTs trending down today.  INR 2.8, he has been started on vitamin K by GI.  He had HD yesterday, breathing is overall better.  He remains in NSR this morning.   General: NAD, obese Neck: Thick, JVP difficult, no thyromegaly or thyroid nodule.  Lungs: Clear to auscultation bilaterally with normal respiratory effort. CV: Nondisplaced PMI.  Heart regular S1/S2, no S3/S4, no murmur.  No peripheral edema.   Abdomen: Soft, nontender, no hepatosplenomegaly, no distention.  Skin: Intact without lesions or rashes.  Neurologic: Alert and oriented x 3.  Psych: Normal affect. Extremities: No clubbing or cyanosis.  HEENT: Normal.   He remains in NSR and is feeling better.  No anti-arrhythmic, ablation, or pacing option. Have reviewed extensively with EP.  Continue Eliquis and Toprol XL 25 mg bid.   Volume status is improving with more aggressive dialysis and sinus rhythm.  Will need HD 4 days/week.   LFTs now trending down.  Cause of rise is uncertain.  I worry that elevated LFTs are from shock liver/congestive hepatopathy with persistent tachycardia earlier in hospitalization and volume overload. This is now improving.  However, cannot rule out effect from a drug (?antibiotic, less likely amiodarone as LFTs were  normal at admission).  Will get RUQ [place] today.   INR elevated with liver failure, getting vitamin K per GI.   [name] 06/09/2022 9:47 AM

## 2022-06-09 NOTE — Progress Notes (Signed)
Contacted [place] and spoke to Agricultural consultant. Inquired if clinic has a spot available for pt to receive HD 4x's a week at d/c should that be needed. Charge RN to discuss with clinic Freight forwarder when she arrives/available. Will assist as needed.   Melven Sartorius Renal Navigator [phone]

## 2022-06-09 NOTE — TOC Progression Note (Signed)
Transition of Care [facility]) - Progression Note    Patient Details  Name: Phillip Young MRN: [id number] Date of Birth: Sep 10, 1985  Transition of [facility] CM/SW Contact  Cyndi Bender, RN Phone Number: 06/09/2022, 1:05 PM  Clinical Narrative:     [name] to [name] with [employer] and ordered home 02. Order has changed to 6L from 2 L. [name] is aware.   Expected Discharge Plan: Home/Self Care Barriers to Discharge: Barriers Resolved  Expected Discharge Plan and Services Expected Discharge Plan: Home/Self Care   Discharge Planning Services: CM Consult   Living arrangements for the past 2 months: Single Family Home                 DME Arranged: Oxygen DME Agency: [employer] Date DME Agency Contacted: 06/09/22 Time DME Agency Contacted: [phone] Representative spoke with at DME Agency: [place] Determinants of Health ([place]) Interventions Housing Interventions: Intervention Not Indicated  Readmission Risk Interventions    06/09/2022   12:19 PM 05/12/2021   10:35 AM 10/16/2020    2:10 PM  Readmission Risk Prevention Plan  Transportation Screening Complete Complete Complete  PCP or Specialist Appt within 5-7 Days   Complete  Home Care Screening   Complete  Medication Review (RN CM)   Referral to Pharmacy  Medication Review (RN Care Manager) Complete Complete   PCP or Specialist appointment within 3-5 days of discharge Complete Complete   HRI or Home Care Consult Complete Complete   SW Recovery Care/Counseling Consult Complete Complete   Palliative Care Screening Not Applicable Complete   [place] Not Applicable Complete

## 2022-06-09 NOTE — Progress Notes (Signed)
Subjective:  Seen in room. Remains on BiPAP from overnight. No new complaints.  Completed dialysis yesterday - net UF 3L     Objective Vital signs in last 24 hours: Vitals:   06/08/22 1800 06/08/22 1830 06/08/22 2359 06/09/22 0419  BP: 105/72 117/88 117/78 117/80  Pulse:   90 95  Resp: (!) 24  20 20   Temp:  98.5 F (36.9 C) 98 F (36.7 C) 98.2 F (36.8 C)  TempSrc:  Oral Oral Oral  SpO2:   100% 100%  Weight:      Height:          Physical Exam: General: Alert NAD  Heart: Irregular,  No m,r,g  Lungs: non labored.Clear, bilaterally  Abdomen: Obese soft NTND Extremities: No significant LE edema Dialysis Access: [facility]   Outpatient Dialysis Orders: [facility] TTS 4:15 425/A1.5x 180 kg 3K/2.5Ca TDC  - Heparin: None/HIT Ab POSITIVE - Mircera 120 q 4 wks (last 10/12) - Hectorol 6 TIW, Sensipar 30 TIW   Assesment/Plan: 1. Atrial flutter with RVR: S/P cardioversion x 3. Last on 11/13. Amiodarone d/c'd. CT chest concerning for amiodarone lung.  Cardiology//HF team following closely -- difficult situation. Limited options.   On apixaban and metoprolol.  2. Acute Hypoxic Resp Failure Combination/volume overload ?PNA, versus amiodarone lung toxicity/OSA.  Antibiotics completed. Prednisone started for possible amiodarone toxicity.  On nightly CPAP. UF with HD as able.  3. ESRD: Usual HD TTS. Will do 4x/week schedule while here. HD MTTS.   Sodium citrate should be ordered for all TDC locks, no heparin.  4. Elevated LFTs. GI consulted. Liver injury from antibiotics vs. Shock liver 5 Hypotension:Blood pressure acceptable.  On metoprolol and midodrine  6. Anemia: Of CKD Hgb 9.0.  on Aranesp 200 mcg q. Saturday 7. CKD-MBD: Calcium ok. Phos not to goal.  Continue  lanthanum.   8. Nutrition: Does not like renal diet. K+ has been controlled, can trial heart healthy diet but he agrees to go back on renal diet if K+/phos worsen. Discussed that he needs to continue fluid restrictions.  9. HFrEF:  20-25%  Phillip Child PA-C [facility] 06/09/2022,9:34 AM   Labs: Basic Metabolic Panel: Recent Labs  Lab 06/04/22 0255 06/05/22 0154 06/06/22 0313 06/07/22 1432 06/08/22 0327 06/09/22 0512  NA 135   < > 131* 131* 131* 133*  K 5.2*   < > 4.3 5.1 4.9 4.2  CL 98   < > 92* 94* 93* 94*  CO2 18*   < > 18*  --  19* 23  GLUCOSE 115*   < > 144* 95 126* 112*  BUN 73*   < > 65* 61* 74* 58*  CREATININE 10.05*   < > 8.40* 7.40* 9.32* 7.79*  CALCIUM 8.7*   < > 7.6*  --  7.4* 7.6*  PHOS 7.1*  --   --   --   --   --    < > = values in this interval not displayed.    Liver Function Tests: Recent Labs  Lab 06/04/22 0255 06/08/22 0327 06/09/22 0512  AST  --  544* 285*  ALT  --  1,755* 1,333*  ALKPHOS  --  180* 244*  BILITOT  --  1.3* 1.4*  PROT  --  8.0 7.9  ALBUMIN 2.9* 3.1* 3.1*    No results for input(s): "LIPASE", "AMYLASE" in the last 168 hours. No results for input(s): "AMMONIA" in the last 168 hours. CBC: Recent Labs  Lab 06/04/22 0255 06/05/22 0154  06/06/22 0313 06/07/22 1432 06/08/22 0327 06/09/22 0512  WBC 18.3* 18.7* 21.5*  --  24.7* 20.6*  HGB 7.9* 8.0* 8.2* 9.9* 8.4* 9.0*  HCT 22.4* 23.9* 23.8* 29.0* 24.2* 25.5*  MCV 106.7* 104.4* 104.8*  --  104.3* 103.7*  PLT 425* 336 292  --  289 274    Cardiac Enzymes: No results for input(s): "CKTOTAL", "CKMB", "CKMBINDEX", "TROPONINI" in the last 168 hours. CBG: No results for input(s): "GLUCAP" in the last 168 hours.  Studies/Results: US Abdomen Limited RUQ (LIVER/GB)  Result Date: 06/09/2022 CLINICAL DATA:  Elevated LFTs EXAM: ULTRASOUND ABDOMEN LIMITED RIGHT UPPER QUADRANT COMPARISON:  Ultrasound 08/20/2021, CT 05/31/2022. FINDINGS: Gallbladder: No gallstones or wall thickening visualized. No sonographic Murphy sign noted by sonographer. Common bile duct: Diameter: 5.2 mm, normal.  No intrahepatic ductal dilation. Liver: Suboptimal views of the liver due to body habitus. No obvious liver  abnormality. Portal vein is patent on color Doppler imaging with normal direction of blood flow towards the liver. Other: Suboptimal exam due to body habitus. IMPRESSION: Suboptimal exam due to body habitus. No evidence of cholecystitis or biliary obstruction. No obvious liver abnormality on suboptimal views. Electronically Signed   By: Maurine Simmering M.D.   On: 06/09/2022 09:21   DG Chest 1 View  Result Date: 06/07/2022 CLINICAL DATA:  Shortness of breath and productive cough. EXAM: CHEST  1 VIEW COMPARISON:  Chest radiographs 05/26/2022 FINDINGS: A right jugular dialysis catheter remains in place, terminating over the right atrium. The cardiac silhouette remains moderately to prominently enlarged. There is likely mild persistent pulmonary vascular congestion. No definite airspace consolidation, sizeable pleural effusion, or pneumothorax is identified within limitations of portable technique, cardiomegaly, and patient body habitus. IMPRESSION: Cardiomegaly and mild pulmonary vascular congestion. Electronically Signed   By: Logan Bores M.D.   On: 06/07/2022 16:33   Medications:  phytonadione (VITAMIN K) 5 mg in dextrose 5 % 50 mL IVPB 5 mg (06/08/22 2135)    (feeding supplement) PROSource Plus  30 mL Oral BID BM   apixaban  5 mg Oral BID   Chlorhexidine Gluconate Cloth  6 each Topical Q0600   darbepoetin (ARANESP) injection - DIALYSIS  200 mcg Subcutaneous Q Sat-1800   fluticasone furoate-vilanterol  1 puff Inhalation Daily   lanthanum  2,000 mg Oral TID WC   melatonin  5 mg Oral QHS   metoprolol succinate  25 mg Oral BID   midodrine  5 mg Oral TID WC   pantoprazole  40 mg Oral Daily   predniSONE  40 mg Oral Q breakfast   torsemide  100 mg Oral Once per day on Sun Wed Fri

## 2022-06-09 NOTE — Progress Notes (Signed)
Mobility Specialist Progress Note:   06/09/22 1230  Mobility  Activity Ambulated with assistance in hallway  Level of Assistance Standby assist, set-up cues, supervision of patient - no hands on  Assistive Device None  Distance Ambulated (ft) 200 ft  Activity Response Tolerated well  Mobility Referral Yes  $Mobility charge 1 Mobility   Pt agreeable to mobility session. Required no physical assist. VSS on 6LO2. Pt left sitting EOB with all needs met.   Nelta Numbers Mobility Specialist Please contact via SecureChat or  Rehab office at [phone]

## 2022-06-10 ENCOUNTER — Other Ambulatory Visit: Payer: Self-pay | Admitting: Student

## 2022-06-10 DIAGNOSIS — J9601 Acute respiratory failure with hypoxia: Secondary | ICD-10-CM | POA: Diagnosis not present

## 2022-06-10 LAB — CBC
HCT: 27.5 % — ABNORMAL LOW (ref 39.0–52.0)
Hemoglobin: 9.1 g/dL — ABNORMAL LOW (ref 13.0–17.0)
MCH: 35.3 pg — ABNORMAL HIGH (ref 26.0–34.0)
MCHC: 33.1 g/dL (ref 30.0–36.0)
MCV: 106.6 fL — ABNORMAL HIGH (ref 80.0–100.0)
Platelets: 258 10*3/uL (ref 150–400)
RBC: 2.58 MIL/uL — ABNORMAL LOW (ref 4.22–5.81)
RDW: 17 % — ABNORMAL HIGH (ref 11.5–15.5)
WBC: 16.2 10*3/uL — ABNORMAL HIGH (ref 4.0–10.5)
nRBC: 0.4 % — ABNORMAL HIGH (ref 0.0–0.2)

## 2022-06-10 LAB — COMPREHENSIVE METABOLIC PANEL
ALT: 916 U/L — ABNORMAL HIGH (ref 0–44)
AST: 142 U/L — ABNORMAL HIGH (ref 15–41)
Albumin: 2.9 g/dL — ABNORMAL LOW (ref 3.5–5.0)
Alkaline Phosphatase: 227 U/L — ABNORMAL HIGH (ref 38–126)
Anion gap: 19 — ABNORMAL HIGH (ref 5–15)
BUN: 73 mg/dL — ABNORMAL HIGH (ref 6–20)
CO2: 20 mmol/L — ABNORMAL LOW (ref 22–32)
Calcium: 7.8 mg/dL — ABNORMAL LOW (ref 8.9–10.3)
Chloride: 93 mmol/L — ABNORMAL LOW (ref 98–111)
Creatinine, Ser: 9.51 mg/dL — ABNORMAL HIGH (ref 0.61–1.24)
GFR, Estimated: 7 mL/min — ABNORMAL LOW (ref >60)
Glucose, Bld: 125 mg/dL — ABNORMAL HIGH (ref 70–99)
Potassium: 3.9 mmol/L (ref 3.5–5.1)
Sodium: 132 mmol/L — ABNORMAL LOW (ref 135–145)
Total Bilirubin: 1 mg/dL (ref 0.3–1.2)
Total Protein: 7.3 g/dL (ref 6.5–8.1)

## 2022-06-10 LAB — PROTIME-INR
INR: 2 — ABNORMAL HIGH (ref 0.8–1.2)
Prothrombin Time: 22.4 seconds — ABNORMAL HIGH (ref 11.4–15.2)

## 2022-06-10 MED ORDER — MELATONIN 5 MG PO TABS: 5.0000 mg | ORAL_TABLET | Freq: Every day | ORAL | 0 refills | Status: DC

## 2022-06-10 MED ORDER — MIDODRINE HCL 5 MG PO TABS: 5.0000 mg | ORAL_TABLET | Freq: Three times a day (TID) | ORAL | 0 refills | Status: DC

## 2022-06-10 MED ORDER — METOPROLOL SUCCINATE ER 25 MG PO TB24: 25.0000 mg | ORAL_TABLET | Freq: Two times a day (BID) | ORAL | 0 refills | Status: DC

## 2022-06-10 MED ORDER — FERROUS SULFATE 325 (65 FE) MG PO TABS: 325.0000 mg | ORAL_TABLET | Freq: Every day | ORAL | 3 refills | Status: DC

## 2022-06-10 MED ORDER — ANTICOAGULANT SODIUM CITRATE 4% (200MG/5ML) IV SOLN
5.0000 mL | Freq: Once | Status: DC
  Filled 2022-06-10 (×2): qty 5

## 2022-06-10 MED ORDER — TORSEMIDE 100 MG PO TABS: 100.0000 mg | ORAL_TABLET | ORAL | 0 refills | Status: DC

## 2022-06-10 MED ORDER — PREDNISONE 20 MG PO TABS: ORAL_TABLET | ORAL | 0 refills | Status: AC

## 2022-06-10 MED ORDER — FLUTICASONE FUROATE-VILANTEROL 100-25 MCG/ACT IN AEPB: 1.0000 | INHALATION_SPRAY | Freq: Every day | RESPIRATORY_TRACT | 0 refills | Status: DC

## 2022-06-10 NOTE — Progress Notes (Signed)
D/C order noted. Case discussed with clinic manager at [facility] yesterday afternoon. Clinic can provide pt an extra treatment on Wednesdays if MD and pt agreeable to that option. Discussed with nephrologist and renal PA who discussed with pt. At this time, the plan is to continue 3x's a week treatments at d/c and clinic staff will assess pt's needs for extra treatment and if pt able/willing to attend extra treatment if indicated. Contacted [place] to advise clinic of pt's d/c today and that pt will resume care on Saturday.   Melven Sartorius Renal Navigator [phone]

## 2022-06-10 NOTE — TOC Initial Note (Addendum)
Transition of Care [facility]) - Initial/Assessment Note    Patient Details  Name: Phillip Young MRN: [id number] Date of Birth: August 24, 1985  [facility]) CM/SW Contact:    Erenest Rasher, RN Phone Number: [phone] 06/10/2022, 2:19 PM  Clinical Narrative:                  HF TOC CM spoke to pt at bedside. Contacted [employer], his claim specialist is Synetta Fail # [phone], fax [phone], claim # [id number]. Faxed auth to disclose info form signed by pt to [place]. Pt had his portable oxygen in room from [place].   Expected Discharge Plan: Home/Self Care Barriers to Discharge: No Barriers Identified   Patient Goals and CMS Choice Patient states their goals for this hospitalization and ongoing recovery are:: wants to get back to work      Expected Discharge Plan and Services Expected Discharge Plan: Home/Self Care   Discharge Planning Services: CM Consult   Living arrangements for the past 2 months: Single Family Home Expected Discharge Date: 06/10/22               DME Arranged: Oxygen DME Agency: [employer] Date DME Agency Contacted: 06/09/22 Time DME Agency Contacted: [phone] Representative spoke with at DME Agency: [name]            Prior Living Arrangements/Services Living arrangements for the past 2 months: [place] Lives with:: Parents Patient language and need for interpreter reviewed:: Yes Do you feel safe going back to the place where you live?: Yes      Need for Family Participation in Patient Care: No (Comment) Care giver support system in place?: Yes (comment) Current home services: DME ([employer]), oxygen ([place])) Criminal Activity/Legal Involvement Pertinent to Current Situation/Hospitalization: No - Comment as needed  Activities of Daily Living Home Assistive Devices/Equipment: BIPAP ADL Screening (condition at time of admission) Patient's cognitive ability adequate to safely complete daily  activities?: Yes Is the patient deaf or have difficulty hearing?: No Does the patient have difficulty seeing, even when wearing glasses/contacts?: No Does the patient have difficulty concentrating, remembering, or making decisions?: No Patient able to express need for assistance with ADLs?: No Does the patient have difficulty dressing or bathing?: No Independently performs ADLs?: Yes (appropriate for developmental age) Does the patient have difficulty walking or climbing stairs?: No Weakness of Legs: None Weakness of Arms/Hands: None  Permission Sought/Granted Permission sought to share information with : Case Manager, Family Supports, PCP Permission granted to share information with : Yes, Verbal Permission Granted  Share Information with NAME: Atom Solivan     Permission granted to share info w Relationship: mother  Permission granted to share info w Contact Information: [phone]  Emotional Assessment Appearance:: Appears stated age Attitude/Demeanor/Rapport: Gracious Affect (typically observed): Accepting Orientation: : Oriented to Self, Oriented to Place, Oriented to  Time, Oriented to Situation   Psych Involvement: No (comment)  Admission diagnosis:  Typical atrial flutter ([name]) [I48.3] Acute on chronic respiratory failure with hypoxia ([place]) [J96.21] Acute hypoxic respiratory failure ([place]) [J96.01] Patient Active Problem List   Diagnosis Date Noted   Elevated liver enzymes 06/08/2022   Elevated sed rate (elev SR) 06/01/2022   Elevated C-reactive protein 06/01/2022   Acute on chronic respiratory failure with hypoxia ([place]) 06/01/2022   Multiple nodules of lung 05/31/2022   Acute hypoxic respiratory failure ([place]) 05/22/2022   Hospital discharge follow-up 04/09/2022   Goals of care, counseling/discussion  Recurrent infections 09/30/2021   Other fatigue 09/30/2021   Pruritic rash 09/17/2021   Reactive depression 09/02/2021   Hypoxemia 09/02/2021   Abscess of iliac  fossa 08/26/2021   Staphylococcal arthritis ([place]) 08/26/2021   Healthcare maintenance 08/26/2021   Sacral osteomyelitis ([place]) 08/26/2021   Obesity 08/26/2021   Volume overload 08/18/2021   Palliative care by specialist    Full code status    Concern about end of life    Medical non-compliance 04/21/2021   Hypotension 04/21/2021   MSSA bacteremia    ESRD (end stage renal disease) on dialysis ([place]) 04/16/2021   Paroxysmal atrial flutter ([place]) 04/12/2021   Acute respiratory failure with hypoxia ([place]) 04/12/2021   Hyponatremia 04/12/2021   Dyspnea    Typical atrial flutter ([place]) 02/11/2021   Elevated troponin 02/11/2021   Chronic respiratory failure with hypoxia ([place]) 02/11/2021   CAP (community acquired pneumonia) 79/09/8331   Chronic systolic CHF (congestive heart failure) ([place])    Hypokalemia    Chronic cough    Morbid obesity ([place]) 02/26/2020   GERD without esophagitis 02/26/2020   Diuretic-induced hypokalemia 02/26/2020   OSA (obstructive sleep apnea) 02/26/2020   Hidradenitis suppurativa 02/26/2020   Prediabetes 02/26/2020   PCP:  [name], MD Pharmacy:   [facility] [phone] - [place], [place] - [address] [place] [address] Phone: [phone] Fax: [phone]     Social Determinants of Health ([place]) Interventions Housing Interventions: Intervention Not Indicated  Readmission Risk Interventions    06/09/2022   12:19 PM 05/12/2021   10:35 AM 10/16/2020    2:10 PM  Readmission Risk Prevention Plan  Transportation Screening Complete Complete Complete  PCP or Specialist Appt within 5-7 Days   Complete  Home Care Screening   Complete  Medication Review (RN CM)   Referral to Pharmacy  Medication Review (RN Care Manager) Complete Complete   PCP or Specialist appointment within 3-5 days of discharge Complete Complete   HRI or Home Care Consult Complete Complete   SW Recovery  Care/Counseling Consult Complete Complete   Palliative Care Screening Not Applicable Complete   [place] Not Applicable Complete

## 2022-06-10 NOTE — Progress Notes (Signed)
Was asked to transport patient out to his ride at the emergency room entrance by patient's nurse Robin. Upon taking the patient to the emergency room entrance I discovered the patient was intending on driving himself home. He appeared sob of breath. Despite much education about how unsafe it was for him to drive himself home displaying labored breathing. He was adamant about driving himself. When asked if he made his nurse aware prior to me assisting with transport he stated he told his ride was at the ER entrance. Continued to plea with him to call someone to come pick him up for his safety and for others. He continued to refuse.Notified his 5W RN Shirlean Mylar that he planned to drive himself and that I educated him against that. I waited approximately 5-10 minutes after notifying his nurse to make sure the patient was ok.   Levora Dredge RN

## 2022-06-10 NOTE — Procedures (Signed)
HD Note:  Some information was entered later than the data was gathered due to patient care needs. The stated time with the data is accurate.  Received patient in bed to unit.  Alert and oriented.  Informed consent signed and in chart.   Patient tolerated well.  Transported back to the room  Alert, without acute distress.  Hand-off given to patient's nurse.   Access used: HD Catheter Access issues: None  Total UF removed: [facility]

## 2022-06-10 NOTE — Progress Notes (Signed)
Subjective:  Seen in room.Seen in [place]. UF goal 3L. No new concerns today. Maybe going home.    Objective Vital signs in last 24 hours: Vitals:   06/10/22 0833 06/10/22 0845 06/10/22 0914 06/10/22 0930  BP: 127/86 132/89 136/87 111/73  Pulse: 96 94 94 (!) 102  Resp: (!) 25 (!) 22 (!) 26 (!) 33  Temp: 98.8 F (37.1 C)     TempSrc:      SpO2: 96% 95% 95% 92%  Weight:      Height:          Physical Exam: General: Alert NAD  Heart: Irregular,  No m,r,g  Lungs: non labored.Clear, bilaterally  Abdomen: Obese soft NTND Extremities: No significant LE edema Dialysis Access: [facility]   Outpatient Dialysis Orders: [facility] TTS 4:15 425/A1.5x 180 kg 3K/2.5Ca TDC  - Heparin: None/HIT Ab POSITIVE - Mircera 120 q 4 wks (last 10/12) - Hectorol 6 TIW, Sensipar 30 TIW   Assesment/Plan: 1. Atrial flutter with RVR: S/P cardioversion x 3. Last on 11/13. Amiodarone d/c'd. CT chest concerning for amiodarone lung.  Cardiology//HF team following closely -- difficult situation. Limited options.   On apixaban and metoprolol.  2. Acute Hypoxic Resp Failure Combination/volume overload ?PNA, versus amiodarone lung toxicity/OSA.  Antibiotics completed. Prednisone started for possible amiodarone toxicity.  On nightly CPAP. UF with HD as able.  3. ESRD: Usual HD TTS. Will do 4x/week schedule while here. HD MTTS.   Sodium citrate should be ordered for all TDC locks, no heparin.  4. Elevated LFTs. GI consulted. Liver injury from antibiotics vs. Shock liver 5 Hypotension:Blood pressure acceptable.  On metoprolol and midodrine  6. Anemia: Of CKD Hgb 9.0.  on Aranesp 200 mcg q. Saturday 7. CKD-MBD: Corr calcium ok. Phos not to goal.  Continue  lanthanum.   8. Nutrition: Does not like renal diet. K+ has been controlled, can trial heart healthy diet but he agrees to go back on renal diet if K+/phos worsen. Discussed that he needs to continue fluid restrictions.  9. HFrEF: 20-25%  Lynnda Child PA-C [facility] 06/10/2022,9:50 AM   Labs: Basic Metabolic Panel: Recent Labs  Lab 06/04/22 0255 06/05/22 0154 06/08/22 0327 06/09/22 0512 06/10/22 0452  NA 135   < > 131* 133* 132*  K 5.2*   < > 4.9 4.2 3.9  CL 98   < > 93* 94* 93*  CO2 18*   < > 19* 23 20*  GLUCOSE 115*   < > 126* 112* 125*  BUN 73*   < > 74* 58* 73*  CREATININE 10.05*   < > 9.32* 7.79* 9.51*  CALCIUM 8.7*   < > 7.4* 7.6* 7.8*  PHOS 7.1*  --   --   --   --    < > = values in this interval not displayed.    Liver Function Tests: Recent Labs  Lab 06/08/22 0327 06/09/22 0512 06/10/22 0452  AST 544* 285* 142*  ALT 1,755* 1,333* 916*  ALKPHOS 180* 244* 227*  BILITOT 1.3* 1.4* 1.0  PROT 8.0 7.9 7.3  ALBUMIN 3.1* 3.1* 2.9*    No results for input(s): "LIPASE", "AMYLASE" in the last 168 hours. No results for input(s): "AMMONIA" in the last 168 hours. CBC: Recent Labs  Lab 06/05/22 0154 06/06/22 0313 06/07/22 1432 06/08/22 0327 06/09/22 0512 06/10/22 0452  WBC 18.7* 21.5*  --  24.7* 20.6* 16.2*  HGB 8.0* 8.2*   < > 8.4* 9.0* 9.1*  HCT 23.9* 23.8*   < >  24.2* 25.5* 27.5*  MCV 104.4* 104.8*  --  104.3* 103.7* 106.6*  PLT 336 292  --  289 274 258   < > = values in this interval not displayed.    Cardiac Enzymes: No results for input(s): "CKTOTAL", "CKMB", "CKMBINDEX", "TROPONINI" in the last 168 hours. CBG: No results for input(s): "GLUCAP" in the last 168 hours.  Studies/Results: US Abdomen Limited RUQ (LIVER/GB)  Result Date: 06/09/2022 CLINICAL DATA:  Elevated LFTs EXAM: ULTRASOUND ABDOMEN LIMITED RIGHT UPPER QUADRANT COMPARISON:  Ultrasound 08/20/2021, CT 05/31/2022. FINDINGS: Gallbladder: No gallstones or wall thickening visualized. No sonographic Murphy sign noted by sonographer. Common bile duct: Diameter: 5.2 mm, normal.  No intrahepatic ductal dilation. Liver: Suboptimal views of the liver due to body habitus. No obvious liver abnormality. Portal vein is patent on color Doppler  imaging with normal direction of blood flow towards the liver. Other: Suboptimal exam due to body habitus. IMPRESSION: Suboptimal exam due to body habitus. No evidence of cholecystitis or biliary obstruction. No obvious liver abnormality on suboptimal views. Electronically Signed   By: Maurine Simmering M.D.   On: 06/09/2022 09:21   Medications:  anticoagulant sodium citrate     phytonadione (VITAMIN K) 5 mg in dextrose 5 % 50 mL IVPB 5 mg (06/09/22 1211)    (feeding supplement) PROSource Plus  30 mL Oral BID BM   apixaban  5 mg Oral BID   Chlorhexidine Gluconate Cloth  6 each Topical Q0600   darbepoetin (ARANESP) injection - DIALYSIS  200 mcg Subcutaneous Q Sat-1800   fluticasone furoate-vilanterol  1 puff Inhalation Daily   lanthanum  2,000 mg Oral TID WC   melatonin  5 mg Oral QHS   metoprolol succinate  25 mg Oral BID   midodrine  5 mg Oral TID WC   pantoprazole  40 mg Oral Daily   predniSONE  40 mg Oral Q breakfast   torsemide  100 mg Oral Once per day on Sun Wed Fri

## 2022-06-10 NOTE — Progress Notes (Signed)
Patient discharging home with family support. RN removed IV and assisted patient in gathering his belongings. Reviewed discharge instructions with patient including medication changes, follow up appointments and heart failure education. No further questions at this time. Patient's family in ED to transport him home.

## 2022-06-10 NOTE — Progress Notes (Addendum)
Patient ID: Phillip Young, male   DOB: Oct 07, 1985, 36 y.o.   MRN: [id number]     Advanced Heart Failure Rounding Note  PCP-Cardiologist: [name], MD   Subjective:    Admitted w/ AFL w/ RVR + acute hypoxic respiratory failure due to PNA vs amiodarone lung toxicity.   CT chest: mass-like consolidations and clustered areas of nodularity bilaterally, multifocal PNA or drug toxicity.   10/28: emergent DCCV in ED>>NSR 11/2: reverted back to AFL w/ RVR,. PO amio discontinued. Started metoprolol 12.78m q6h. Abx started for PNA 11/3: DCCV to NSR 11/6: Back in AFL. Completed PNA coverage with ceftriaxone/doxycycline. Repeat ESR higher 113>>135  11/7: Started prednisone for possible amiodarone toxicity 11/13: DCCV>>NSR 11/14: marked elevation in transaminases. GI consulted 11:15: RUQ [place] consistent with biliary obstruction   Remains in NSR this morning. HR 80s.  Getting iHD. Less O2 requirements today, down to 4L/[place].    AST/ALT trending down AST 544>>285>>142 ALT 1755>>1333>>916  Alk Phos 180>>240>>227 Tibili 1.3>>1.4>>1.0   INR 2.8>>2.0. Hgb stable 9.1. No gross bleeding   RUQ UK[place]optimal exam due to body habitus. No evidence of cholecystitis or biliary obstruction. No obvious liver abnormality on suboptimal views.   Objective:   Weight Range: (!) 186.2 kg Body mass index is 55.67 kg/m.   Vital Signs:   Temp:  [97.8 F (36.6 C)-98.8 F (37.1 C)] 98.8 F (37.1 C) (11/16 0833) Pulse Rate:  [89-104] 96 (11/16 1130) Resp:  [18-33] 28 (11/16 1130) BP: (99-136)/(70-94) 99/75 (11/16 1130) SpO2:  [90 %-100 %] 90 % (11/16 1130) Weight:  [186.2 kg] 186.2 kg (11/16 1008) Last BM Date : 06/05/22  Weight change: Filed Weights   06/07/22 1410 06/08/22 1445 06/10/22 1008  Weight: (!) 162.5 kg (!) 181.7 kg (!) 186.2 kg    Intake/Output:   Intake/Output Summary (Last 24 hours) at 06/10/2022 1158 Last data filed at 06/10/2022 0326 Gross per 24 hour  Intake 1370 ml   Output --  Net 1370 ml       Physical Exam    General:  Well appearing, obese. No respiratory difficulty HEENT: normal Neck: supple. Thick neck, JVD not well visualized. Carotids 2+ bilat; no bruits. No lymphadenopathy or thyromegaly appreciated. Cor: PMI nondisplaced. Regular rate & rhythm. No rubs, gallops or murmurs. Lungs: clear Abdomen: soft, nontender, nondistended. No hepatosplenomegaly. No bruits or masses. Good bowel sounds. Extremities: no cyanosis, clubbing, rash, edema Neuro: alert & oriented x 3, cranial nerves grossly intact. moves all 4 extremities w/o difficulty. Affect pleasant.    Telemetry   NSR 80s  (personally reviewed)  Labs    CBC Recent Labs    06/09/22 0512 06/10/22 0452  WBC 20.6* 16.2*  HGB 9.0* 9.1*  HCT 25.5* 27.5*  MCV 103.7* 106.6*  PLT 274 2939  Basic Metabolic Panel Recent Labs    06/09/22 0512 06/10/22 0452  NA 133* 132*  K 4.2 3.9  CL 94* 93*  CO2 23 20*  GLUCOSE 112* 125*  BUN 58* 73*  CREATININE 7.79* 9.51*  CALCIUM 7.6* 7.8*   Liver Function Tests Recent Labs    06/09/22 0512 06/10/22 0452  AST 285* 142*  ALT 1,333* 916*  ALKPHOS 244* 227*  BILITOT 1.4* 1.0  PROT 7.9 7.3  ALBUMIN 3.1* 2.9*     No results for input(s): "LIPASE", "AMYLASE" in the last 72 hours. Cardiac Enzymes No results for input(s): "CKTOTAL", "CKMB", "CKMBINDEX", "TROPONINI" in the last 72 hours.  BNP: BNP (last 3 results) Recent Labs  02/18/22 0154 03/26/22 1958 05/22/22 0732  BNP 373.8* 374.2* 373.1*    ProBNP (last 3 results) No results for input(s): "PROBNP" in the last 8760 hours.   D-Dimer No results for input(s): "DDIMER" in the last 72 hours. Hemoglobin A1C No results for input(s): "HGBA1C" in the last 72 hours. Fasting Lipid Panel No results for input(s): "CHOL", "HDL", "LDLCALC", "TRIG", "CHOLHDL", "LDLDIRECT" in the last 72 hours. Thyroid Function Tests No results for input(s): "TSH", "T4TOTAL", "T3FREE",  "THYROIDAB" in the last 72 hours.  Invalid input(s): "FREET3"    Other results:   Imaging    No results found.   Medications:     Scheduled Medications:  (feeding supplement) PROSource Plus  30 mL Oral BID BM   apixaban  5 mg Oral BID   Chlorhexidine Gluconate Cloth  6 each Topical Q0600   darbepoetin (ARANESP) injection - DIALYSIS  200 mcg Subcutaneous Q Sat-1800   fluticasone furoate-vilanterol  1 puff Inhalation Daily   lanthanum  2,000 mg Oral TID WC   melatonin  5 mg Oral QHS   metoprolol succinate  25 mg Oral BID   midodrine  5 mg Oral TID WC   pantoprazole  40 mg Oral Daily   predniSONE  40 mg Oral Q breakfast   torsemide  100 mg Oral Once per day on Sun Wed Fri    Infusions:  anticoagulant sodium citrate     phytonadione (VITAMIN K) 5 mg in dextrose 5 % 50 mL IVPB 5 mg (06/09/22 1211)     PRN Medications: acetaminophen **OR** acetaminophen, guaiFENesin-dextromethorphan, ondansetron, polyethylene glycol, senna-docusate    Patient Profile   36 y/o male w/ chronic biventricular heart failure due to NICM, PAF/AFL s/p multiple DCCVs, multiple intolerances to amiodarone, ESRD on iHD, OSA on CPAP and morbid obesity, admitted for acute hypoxic respiratory failure and hypotension in the setting of recurrent ALF w/ RVR, requiring emergent DCCV in ED. Now w/ concern for potential amiodarone pulmonary toxicity. Also being treated for possible PNA.   Assessment/Plan   1. Atrial flutter/fibrillation: History of multiple DCCVs. Amiodarone previously stopped due to elevated LFTs. Restarted 05/20/22 by Dr. Curt Bears.  Admitted w/ hemodynamically unstable AFL w/ RVR requiring emergent DCCV>>NSR in ER.  Atrial flutter recurred with RVR post-DCCV.  Now w/ concern for possible amiodarone pulmonary toxicity w/ elevated ESR 113 and abnormal high resolution chest CT showing mass-like consolidations and clustered areas of nodularity bilaterally, multifocal PNA or drug toxicity.  Amiodarone stopped (he says breathing got worse after restarting on amiodarone). Required DCCV x 3 this admission.  I reviewed ECGs with EP, he has been in BOTH atrial flutter (typical and atypical) and fibrillation this admission. We have been struggling to rate control him. He is in NSR this morning after last DCCV 11/13.  - Per Dr. Curt Bears, he is not a candidate for atrial flutter or fibrillation ablation.  Has multiple flutter circuits and also has had fibrillation this admission.  Would not ablate flutter alone without treating atrial fibrillation, and he would not be a candidate for atrial fibrillation ablation.  - Continue Toprol XL 25 mg bid, has not been able to tolerate higher doses.  - Continue midodrine 5 mg tid to maintain BP.   - Poor candidate for AV nodal ablation with BiV pacing due to ESRD and infection risk, EP has evaluated and deemed not a candidate.  - Not candidate for amiodarone (lung and hepatic toxicity) or Tikosyn (ESRD), no anti-arrhythmic option.   - Continue Eliquis 5  mg bid  2.  Chronic systolic CHF: Nonischemic CMP, LHC 2021 with no CAD. Previous hospitalizations w/shock in setting of rapid AFib/Flutter requiring milrinone. Most recent echo 8/23 with EF 20-25%, RV mildly reduced. ESRD, volume controlled by HD.   - GDMT limited by CKD and hypotension requiring midodrine  - Not candidate for ICD given obesity and dialysis  - Not candidate for advanced therapies.  3. Acute Hypoxic Respiratory Failure: Combination of volume overload and multifocal PNA.  High resolution CT chest showed mass-like consolidations and clustered areas of nodularity bilaterally, multifocal PNA or drug toxicity. Unclear if lung opacifications are infectious or inflammatory. Repeat ESR higher 113>>135. Suspect amiodarone lung toxicity, pulmonary following.  He has completed a course of abx with no improvement in CT chest or inflammatory markers.  - Amiodarone has been stopped - Prednisone started for  suspected inflammatory lung disease from amiodarone lung toxicity, will followup as outpatient with pulmonary. Does feel improvement with prednisone.  - Less O2 requirements today, down to 4L [place].  4. ESRD: HD per nephrology, on T,Th,Sat schedule.   - Volume improved, getting iHD today  5. Obesity: Body mass index is 55.64 kg/m.  - wt loss advised 6. Fe deficiency anemia.  - T-sat < 20%. Received Ferrlecit per primary team  7. OSA: Continue CPAP QHS 8. Elevated Transaminases:  Last LFTs on 05/28/22 were normal.  RUQ [place] not consistent with biliary obstruction. Unlikely that this is due to amiodarone as admission LFTs were normal.  Possible rise due to shock liver physiology with cardiomyopathy and volume overload + persistent atrial arrhythmias vs drug-induced liver injury (antibiotics from recent pneumonia treatment)  - AST/ALT starting to trend down  - GI consulted and s/o. No plans for liver biopsy at this time . Continue supportive care, avoid hepatotoxins. Appreciate GI's assistance  Length of Stay: [address] Phillip Young  06/10/2022 11:58 AM  Patient seen with PA, agree with the above note.   He had HD today, down to 2L [place]. Breathing better.  Remains in NSR.   General: NAD, obese.  Neck: No JVD, no thyromegaly or thyroid nodule.  Lungs: Clear to auscultation bilaterally with normal respiratory effort. CV: Nondisplaced PMI.  Heart regular S1/S2, no S3/S4, no murmur.  No peripheral edema.   Abdomen: Soft, nontender, no hepatosplenomegaly, no distention.  Skin: Intact without lesions or rashes.  Neurologic: Alert and oriented x 3.  Psych: Normal affect. Extremities: No clubbing or cyanosis.  HEENT: Normal.   Volume status improved, will need HD 4 x/week at home.    LFTs trending down, suspect elevation was due to hepatic congestion/shock liver in setting of persistent AF/RVR.   He remains in NSR on Toprol XL, continue.   He is on prednisone for possible amiodarone lung  toxicity.  Needs close followup with pulmonary as outpatient.    Imperative to lose weight, will refer to pharmacy clinic as outpatient to see if he can get GLP-1 agonist.    Plan noted for home today.  Make sure to schedule close pulmonary followup for prednisone tapering.  Will arrange CHF clinic followup.   [name] 06/10/2022 1:39 PM

## 2022-06-10 NOTE — Plan of Care (Signed)
  Problem: Education: Goal: Knowledge of General Education information will improve Description Including pain rating scale, medication(s)/side effects and non-pharmacologic comfort measures Outcome: Progressing   

## 2022-06-11 ENCOUNTER — Telehealth: Payer: Self-pay | Admitting: Nephrology

## 2022-06-11 NOTE — Telephone Encounter (Signed)
Transition of care contact from inpatient facility  Date of discharge: 06/10/22 Date of contact: 06/11/22 Method: Phone Spoke to: Patient  Patient contacted to discuss transition of care from recent inpatient hospitalization. Patient was admitted to [facility] from 10/28 -06/10/22 with discharge diagnosis of Atrial flutter with RVR/Acute hypoxic respiratory failure   Medication changes were reviewed. Stopped amiodarone   Patient will follow up with his/her outpatient HD unit on: Saturday 11/18

## 2022-06-12 DIAGNOSIS — T8249XA Other complication of vascular dialysis catheter, initial encounter: Secondary | ICD-10-CM | POA: Diagnosis not present

## 2022-06-12 DIAGNOSIS — Z992 Dependence on renal dialysis: Secondary | ICD-10-CM | POA: Diagnosis not present

## 2022-06-12 DIAGNOSIS — N186 End stage renal disease: Secondary | ICD-10-CM | POA: Diagnosis not present

## 2022-06-12 DIAGNOSIS — N2581 Secondary hyperparathyroidism of renal origin: Secondary | ICD-10-CM | POA: Diagnosis not present

## 2022-06-14 ENCOUNTER — Telehealth (HOSPITAL_COMMUNITY): Payer: Self-pay | Admitting: Cardiology

## 2022-06-14 DIAGNOSIS — I5022 Chronic systolic (congestive) heart failure: Secondary | ICD-10-CM

## 2022-06-14 NOTE — Telephone Encounter (Signed)
Per RN Case Manager Pt in need of prep referral and healthy weight management referral as patient expressed interest  Order placed

## 2022-06-15 DIAGNOSIS — I509 Heart failure, unspecified: Secondary | ICD-10-CM | POA: Diagnosis not present

## 2022-06-15 DIAGNOSIS — I5023 Acute on chronic systolic (congestive) heart failure: Secondary | ICD-10-CM | POA: Diagnosis not present

## 2022-06-15 DIAGNOSIS — J9610 Chronic respiratory failure, unspecified whether with hypoxia or hypercapnia: Secondary | ICD-10-CM | POA: Diagnosis not present

## 2022-06-16 ENCOUNTER — Telehealth: Payer: Self-pay

## 2022-06-16 DIAGNOSIS — N2581 Secondary hyperparathyroidism of renal origin: Secondary | ICD-10-CM | POA: Diagnosis not present

## 2022-06-16 DIAGNOSIS — Z992 Dependence on renal dialysis: Secondary | ICD-10-CM | POA: Diagnosis not present

## 2022-06-16 DIAGNOSIS — N186 End stage renal disease: Secondary | ICD-10-CM | POA: Diagnosis not present

## 2022-06-16 DIAGNOSIS — L298 Other pruritus: Secondary | ICD-10-CM | POA: Diagnosis not present

## 2022-06-16 DIAGNOSIS — T8249XA Other complication of vascular dialysis catheter, initial encounter: Secondary | ICD-10-CM | POA: Diagnosis not present

## 2022-06-16 NOTE — Telephone Encounter (Signed)
Call to pt reference PREP referral Explained program to pt. Interested in participating.  Needs a MW schedule 1pm is okay. Okay with coming to [facility] (lives in [place]).  Texted my number for contact.

## 2022-06-18 DIAGNOSIS — I5082 Biventricular heart failure: Secondary | ICD-10-CM | POA: Diagnosis not present

## 2022-06-18 DIAGNOSIS — I5023 Acute on chronic systolic (congestive) heart failure: Secondary | ICD-10-CM | POA: Diagnosis not present

## 2022-06-19 DIAGNOSIS — Z992 Dependence on renal dialysis: Secondary | ICD-10-CM | POA: Diagnosis not present

## 2022-06-19 DIAGNOSIS — T8249XA Other complication of vascular dialysis catheter, initial encounter: Secondary | ICD-10-CM | POA: Diagnosis not present

## 2022-06-19 DIAGNOSIS — N186 End stage renal disease: Secondary | ICD-10-CM | POA: Diagnosis not present

## 2022-06-19 DIAGNOSIS — N2581 Secondary hyperparathyroidism of renal origin: Secondary | ICD-10-CM | POA: Diagnosis not present

## 2022-06-19 DIAGNOSIS — L298 Other pruritus: Secondary | ICD-10-CM | POA: Diagnosis not present

## 2022-06-21 DIAGNOSIS — N186 End stage renal disease: Secondary | ICD-10-CM | POA: Diagnosis not present

## 2022-06-21 DIAGNOSIS — D631 Anemia in chronic kidney disease: Secondary | ICD-10-CM | POA: Diagnosis not present

## 2022-06-21 DIAGNOSIS — I4891 Unspecified atrial fibrillation: Secondary | ICD-10-CM | POA: Diagnosis not present

## 2022-06-21 DIAGNOSIS — I21A1 Myocardial infarction type 2: Secondary | ICD-10-CM | POA: Diagnosis not present

## 2022-06-21 DIAGNOSIS — E8770 Fluid overload, unspecified: Secondary | ICD-10-CM | POA: Diagnosis not present

## 2022-06-21 DIAGNOSIS — Z6841 Body Mass Index (BMI) 40.0 and over, adult: Secondary | ICD-10-CM | POA: Diagnosis not present

## 2022-06-21 DIAGNOSIS — J9621 Acute and chronic respiratory failure with hypoxia: Secondary | ICD-10-CM | POA: Diagnosis not present

## 2022-06-21 DIAGNOSIS — I214 Non-ST elevation (NSTEMI) myocardial infarction: Secondary | ICD-10-CM | POA: Diagnosis not present

## 2022-06-21 DIAGNOSIS — I483 Typical atrial flutter: Secondary | ICD-10-CM | POA: Diagnosis not present

## 2022-06-21 DIAGNOSIS — E871 Hypo-osmolality and hyponatremia: Secondary | ICD-10-CM | POA: Diagnosis not present

## 2022-06-21 DIAGNOSIS — I5023 Acute on chronic systolic (congestive) heart failure: Secondary | ICD-10-CM | POA: Diagnosis not present

## 2022-06-21 DIAGNOSIS — Z992 Dependence on renal dialysis: Secondary | ICD-10-CM | POA: Diagnosis not present

## 2022-06-21 DIAGNOSIS — I5020 Unspecified systolic (congestive) heart failure: Secondary | ICD-10-CM | POA: Diagnosis not present

## 2022-06-21 DIAGNOSIS — N179 Acute kidney failure, unspecified: Secondary | ICD-10-CM | POA: Diagnosis not present

## 2022-06-21 DIAGNOSIS — J9601 Acute respiratory failure with hypoxia: Secondary | ICD-10-CM | POA: Diagnosis not present

## 2022-06-21 DIAGNOSIS — I4892 Unspecified atrial flutter: Secondary | ICD-10-CM | POA: Diagnosis not present

## 2022-06-21 DIAGNOSIS — E872 Acidosis, unspecified: Secondary | ICD-10-CM | POA: Diagnosis not present

## 2022-06-21 DIAGNOSIS — I5022 Chronic systolic (congestive) heart failure: Secondary | ICD-10-CM | POA: Diagnosis not present

## 2022-06-21 DIAGNOSIS — T462X1D Poisoning by other antidysrhythmic drugs, accidental (unintentional), subsequent encounter: Secondary | ICD-10-CM | POA: Diagnosis not present

## 2022-06-21 DIAGNOSIS — R0602 Shortness of breath: Secondary | ICD-10-CM | POA: Diagnosis not present

## 2022-06-21 DIAGNOSIS — E119 Type 2 diabetes mellitus without complications: Secondary | ICD-10-CM | POA: Diagnosis not present

## 2022-06-21 DIAGNOSIS — E1122 Type 2 diabetes mellitus with diabetic chronic kidney disease: Secondary | ICD-10-CM | POA: Diagnosis not present

## 2022-06-21 DIAGNOSIS — I5082 Biventricular heart failure: Secondary | ICD-10-CM | POA: Diagnosis not present

## 2022-06-21 DIAGNOSIS — I480 Paroxysmal atrial fibrillation: Secondary | ICD-10-CM | POA: Diagnosis not present

## 2022-06-21 DIAGNOSIS — I428 Other cardiomyopathies: Secondary | ICD-10-CM | POA: Diagnosis not present

## 2022-06-21 DIAGNOSIS — I132 Hypertensive heart and chronic kidney disease with heart failure and with stage 5 chronic kidney disease, or end stage renal disease: Secondary | ICD-10-CM | POA: Diagnosis not present

## 2022-06-25 NOTE — Progress Notes (Incomplete)
PCP: [name], MD Cardiology: Dr. [name]  HPI: 36 y.o. with history of cardiomyopathy and obesity.  He has been known to have a cardiomyopathy (biventricular failure) since back in 2019, echo in 2019 with EF 20% [facility]).  He has been followed in the [facility] system.  Admitted to [facility] in 4/09 with A/C Systolic HF. Diuresed with IV lasix and transitioned to torsemide. Placed on milrinone with low output and had RHC/LHC. Cath showed no CAD and volume overload.  Blood gas revealed a CO2 level elevated to 78. CPAP was initiated.   He was unable to tolerate Coreg due to wheezing and diarrhea.   Echo in 11/21 showed EF 20-25%, RV poorly visualized.  He was seen by EP, thought to be too large for cardiac MRI.   Patient was admitted in 7/22 with cardiogenic shock and AKI.  He ended up on CVVH.  He had a prolonged hospitalization for volume removal and titration off inotropes/pressors.  Eventually he was able to transition to [facility] and was discharged. Hospitalization was complicated by AF with RVR as well as HIT.  He was cardioverted several times.  Echo in 7/22 showed EF < 20%, severe LV enlargement, normal RV.   Admitted 9/22 with MSSA bacteremia due to infected HD catheter. TEE showed EF 20%, severely decreased LV with global HK, moderately reduced RV, no vegetation or thrombus noted. Hospitalization complicated by septic arthritis and muscle abscess. IV abx per ID.     Admitted 1/23 at [facility] with a/c CHF.  Re-admitted shortly thereafter after presenting to HD tachycardic and tachypneic. HD staff unable to access his dialysis catheter. He underwent a tunnel catheter exchange and taken for AV fistula creation.    Admitted 2/23 for acute on chronic hypoxic respiratory failure and a/c CHF in setting of incomplete HD sessions and dietary indiscretion/noncompliance with fluid restriction. He received serial HD sessions and was seen by PMT for [place], he remained full code.    Patient had recurrent AF with DCCV in 3/23. Amiodarone has been stopped due to elevated LFTs.   Echo (8/23) showed EF 20-25%, global LV HK, normal RV, RVSP 70.6 mmHg, mild to moderate MR  Admitted 9/23 with a/c CHF in setting of missing dialysis session and flu-like illness. Underwent dialysis session. No source of infection identified and abx stopped at discharge. Palliative care consulted as patient is thinking about stopping dialysis. He remained full code.  >>>Today he returns for post hospital HF follow up. Overall feeling fine. He is able to walk around [facility] without dyspnea. 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 Denies palpitations, abnormal bleeding, CP, dizziness, edema, or PND/Orthopnea. Appetite ok. No fever or chills. Weight stable. Wears CPAP and has stopped his torsemide.  Admitted w/ AFL w/ RVR 11/23 + acute hypoxic respiratory failure due to PNA vs amiodarone lung toxicity.    CT chest: mass-like consolidations and clustered areas of nodularity bilaterally, multifocal PNA or drug toxicity.    10/28: emergent DCCV in ED>>NSR 11/2: reverted back to AFL w/ RVR,. PO amio discontinued. Started metoprolol 12.58m q6h. Abx started for PNA 11/3: DCCV to NSR 11/6: Back in AFL. Completed PNA coverage with ceftriaxone/doxycycline. Repeat ESR higher 113>>135  11/7: Started prednisone for possible amiodarone toxicity 11/13: DCCV>>NSR 11/14: marked elevation in transaminases. GI consulted 11:15: RUQ [place] consistent with biliary obstruction       Labs (10/21): K 3.7, creatinine 1.07 Labs (2/23): AST 25, ALT 113  Labs (9/23): hgb 9.3, LFTs normal  ECG (personally reviewed): NSR 88 bpm  PMH: 1. Hidradenitis suppuritiva 2. GERD 3. OHS/OSA  4. Diabetes 5. Morbid obesity 6. Fe deficiency anemia 7. Chronic systolic CHF: Nonischemic cardiomyopathy.  No history of ETOH or drugs.  +ANA.  No strong FH of cardiomyopathy.  - Echo (10/19, [facility]): EF 20%.   - Echo (5/21, [facility]): EF 20% - Echo (8/21): EF < 20%, moderate LV dilation, severely decreased RV function with severe RV dilation, severe biatrial enlargement, mild MR.  - LHC/RHC (8/21): No significant coronary disease; mean RA 19, PA 71/20 mean 39, mean PCWP 23, CI 2.1, PVR 2.5 WU.  - Echo (11/21): EF 20-25%, RV poorly visualized.  - Echo (7/22): EF < 20%, severely dilated LV.  - TEE (10/22): EF 20%, moderate LV dilation, mild LVH, moderately reduced RV systolic function - Echo (8/23): EF 20-25%, global LV HK, normal RV, RVSP 70.6 mmHg, mild to moderate MR 8. Prior smoker.  9. ESRD 10. Atrial fibrillation: Paroxysmal.  11. History of HIT.   ROS: All systems negative except as listed in HPI, PMH and Problem List.  Social History   Socioeconomic History   Marital status: Single    Spouse name: Not on file   Number of children: Not on file   Years of education: Not on file   Highest education level: Not on file  Occupational History   Not on file  Tobacco Use   Smoking status: Former    Packs/day: 1.00    Types: Cigarettes    Quit date: 2019    Years since quitting: 4.9    Passive exposure: Current   Smokeless tobacco: Former   Tobacco comments:    quit in 2019  Vaping Use   Vaping Use: Never used  Substance and Sexual Activity   Alcohol use: Never   Drug use: Never   Sexual activity: Not on file  Other Topics Concern   Not on file  Social History Narrative   Not on file   Social Determinants of Health   Financial Resource Strain: Low Risk  (04/13/2021)   Overall Financial Resource Strain (CARDIA)    Difficulty of Paying Living Expenses: Not very hard  Food Insecurity: No Food Insecurity (05/23/2022)   Hunger Vital Sign    Worried About Running Out of Food in the Last Year: Never true    Ran Out of Food in the Last Year: Never true  Transportation Needs: No Transportation Needs (05/23/2022)   PRAPARE - Hydrologist (Medical):  No    Lack of Transportation (Non-Medical): No  Physical Activity: Not on file  Stress: Not on file  Social Connections: Not on file  Intimate Partner Violence: Not At Risk (05/23/2022)   Humiliation, Afraid, Rape, and Kick questionnaire    Fear of Current or Ex-Partner: No    Emotionally Abused: No    Physically Abused: No    Sexually Abused: No   Family History  Problem Relation Age of Onset   Heart disease Mother    Hypertension Mother    Pulmonary Hypertension Mother    Drug abuse Father        died due to Heroin overdose    Past Medical History:  Diagnosis Date   Acute on chronic respiratory failure with hypoxia ([place]) 04/21/2021   Acute on chronic systolic (congestive) heart failure ([place]) 02/26/2020   Biventricular congestive heart failure ([place])    Last Echo 11/2019  at [facility] reveals EF 20%   Class 3 severe obesity due to excess calories with serious comorbidity and body mass index (BMI) of 50.0 to 59.9 in adult [facility]) 02/26/2020   Essential hypertension 02/26/2020   GERD without esophagitis 02/26/2020   Hidradenitis suppurativa 02/26/2020   OSA (obstructive sleep apnea) 02/26/2020   Prediabetes 02/26/2020   Renal disorder     Current Outpatient Medications  Medication Sig Dispense Refill   apixaban (ELIQUIS) 5 MG TABS tablet Take 1 tablet (5 mg total) by mouth 2 (two) times daily. 180 tablet 3   diclofenac Sodium (VOLTAREN) 1 % GEL Apply 4 g topically 3 (three) times daily as needed (joint pains). 150 g 0   ferrous sulfate 325 (65 FE) MG tablet Take 1 tablet (325 mg total) by mouth daily. 30 tablet 3   fluticasone furoate-vilanterol (BREO ELLIPTA) 100-25 MCG/ACT AEPB Inhale 1 puff into the lungs daily. 30 each 0   lanthanum (FOSRENOL) 1000 MG chewable tablet Chew 2,000 mg by mouth 3 (three) times daily with meals.     melatonin 5 MG TABS Take 1 tablet (5 mg total) by mouth at bedtime. 30 tablet 0   Menthol (HALLS COUGH DROPS) 5.8 MG LOZG Use as directed 1 lozenge in  the mouth or throat every 4 (four) hours as needed (cough).     metoprolol succinate (TOPROL-XL) 25 MG 24 hr tablet TAKE 1 TABLET(25 MG) BY MOUTH TWICE DAILY 180 tablet 3   midodrine (PROAMATINE) 5 MG tablet TAKE 1 TABLET(5 MG) BY MOUTH THREE TIMES DAILY WITH MEALS 270 tablet 3   predniSONE (DELTASONE) 20 MG tablet Take 2 tablets (40 mg total) by mouth daily with breakfast for 14 days, THEN 1.5 tablets (30 mg total) daily with breakfast for 14 days, THEN 1 tablet (20 mg total) daily with breakfast for 14 days. 63 tablet 0   torsemide (DEMADEX) 100 MG tablet TAKE 1 TABLET BY MOUTH 3 TIMES A WEEK ON NON-DIALYSIS DAYS. 38 tablet 2   No current facility-administered medications for this visit.   There were no vitals taken for this visit.  Wt Readings from Last 3 Encounters:  06/10/22 (!) 186.2 kg (410 lb 7.9 oz)  05/20/22 (!) 181.9 kg (401 lb)  05/18/22 (!) 182.1 kg (401 lb 6.4 oz)   PHYSICAL EXAM: General:  NAD. No resp difficulty, walked into clinic HEENT: Normal Neck: Supple. No JVD. Carotids 2+ bilat; no bruits. No lymphadenopathy or thryomegaly appreciated. Cor: PMI nondisplaced. Regular rate & rhythm. No rubs, gallops or murmurs. Lungs: Diminished in bases; R chest TDC looks ok. Abdomen: Obese, soft, nontender, nondistended. No hepatosplenomegaly. No bruits or masses. Good bowel sounds. Extremities: No cyanosis, clubbing, rash, edema Neuro: Alert & oriented x 3, cranial nerves grossly intact. Moves all 4 extremities w/o difficulty. Affect pleasant.  ASSESSMENT & PLAN: 1. Atrial flutter/fibrillation: History of multiple DCCVs. Amiodarone previously stopped due to elevated LFTs. Restarted 05/20/22 by Dr. Curt Bears.  Admitted w/ hemodynamically unstable AFL w/ RVR requiring emergent DCCV>>NSR in ER.  Atrial flutter recurred with RVR post-DCCV.  Now w/ concern for possible amiodarone pulmonary toxicity w/ elevated ESR 113 and abnormal high resolution chest CT showing mass-like consolidations  and clustered areas of nodularity bilaterally, multifocal PNA or drug toxicity. Amiodarone stopped (he says breathing got worse after restarting on amiodarone). Required DCCV x 3 this admission.  I reviewed ECGs with EP, he has been in BOTH atrial flutter (typical and atypical) and fibrillation this admission. We have been struggling to  rate control him. He is in NSR this morning after last DCCV 11/13.  - Per Dr. Curt Bears, he is not a candidate for atrial flutter or fibrillation ablation.  Has multiple flutter circuits and also has had fibrillation this admission.  Would not ablate flutter alone without treating atrial fibrillation, and he would not be a candidate for atrial fibrillation ablation.  - Continue Toprol XL 25 mg bid, has not been able to tolerate higher doses.  - Continue midodrine 5 mg tid to maintain BP.   - Poor candidate for AV nodal ablation with BiV pacing due to ESRD and infection risk, EP has evaluated and deemed not a candidate.  - Not candidate for amiodarone (lung and hepatic toxicity) or Tikosyn (ESRD), no anti-arrhythmic option.   - Continue Eliquis 5 mg bid  2.  Chronic systolic CHF: Nonischemic CMP, LHC 2021 with no CAD. Previous hospitalizations w/shock in setting of rapid AFib/Flutter requiring milrinone. Most recent echo 8/23 with EF 20-25%, RV mildly reduced. ESRD, volume controlled by HD.   - GDMT limited by CKD and hypotension requiring midodrine  - Not candidate for ICD given obesity and dialysis  - Not candidate for advanced therapies.  3. Acute Hypoxic Respiratory Failure: Combination of volume overload and multifocal PNA.  High resolution CT chest showed mass-like consolidations and clustered areas of nodularity bilaterally, multifocal PNA or drug toxicity. Unclear if lung opacifications are infectious or inflammatory. Repeat ESR higher 113>>135. Suspect amiodarone lung toxicity, pulmonary following.  He has completed a course of abx with no improvement in CT chest or  inflammatory markers.  - Amiodarone has been stopped - Prednisone started for suspected inflammatory lung disease from amiodarone lung toxicity, will followup as outpatient with pulmonary. Does feel improvement with prednisone.  - Less O2 requirements today, down to 4L [place].  4. ESRD: HD per nephrology, on T,Th,Sat schedule.   - Volume improved, getting iHD today  5. Obesity: Body mass index is 55.64 kg/m.  - wt loss advised 6. Fe deficiency anemia.  - T-sat < 20%. Received Ferrlecit per primary team  7. OSA: Continue CPAP QHS 8. Elevated Transaminases:  Last LFTs on 05/28/22 were normal.  RUQ [place] not consistent with biliary obstruction. Unlikely that this is due to amiodarone as admission LFTs were normal.  Possible rise due to shock liver physiology with cardiomyopathy and volume overload + persistent atrial arrhythmias vs drug-induced liver injury (antibiotics from recent pneumonia treatment)  - AST/ALT starting to trend down  - GI consulted and s/o. No plans for liver biopsy at this time . Continue supportive care, avoid hepatotoxins. Appreciate GI's assistance

## 2022-06-28 ENCOUNTER — Encounter (HOSPITAL_COMMUNITY): Payer: BC Managed Care – PPO

## 2022-07-03 DIAGNOSIS — N2581 Secondary hyperparathyroidism of renal origin: Secondary | ICD-10-CM | POA: Diagnosis not present

## 2022-07-03 DIAGNOSIS — N186 End stage renal disease: Secondary | ICD-10-CM | POA: Diagnosis not present

## 2022-07-03 DIAGNOSIS — T8249XA Other complication of vascular dialysis catheter, initial encounter: Secondary | ICD-10-CM | POA: Diagnosis not present

## 2022-07-03 DIAGNOSIS — Z992 Dependence on renal dialysis: Secondary | ICD-10-CM | POA: Diagnosis not present

## 2022-07-04 DIAGNOSIS — R9431 Abnormal electrocardiogram [ECG] [EKG]: Secondary | ICD-10-CM | POA: Diagnosis not present

## 2022-07-04 NOTE — Progress Notes (Deleted)
Synopsis: Referred for *** by [name], *  Subjective:   PATIENT ID: Phillip Young GENDER: male DOB: July 19, 1986, MRN: [id number]  No chief complaint on file.  36yM with NICM, OSA/OHS on trilogy, chronic hypoxic respiratory failure, recently diagnosed amiodarone related lung injury started on prednisone 40 mg daily  Otherwise pertinent review of systems is negative.  Past Medical History:  Diagnosis Date   Acute on chronic respiratory failure with hypoxia ([place]) 04/21/2021   Acute on chronic systolic (congestive) heart failure ([name]) 02/26/2020   Biventricular congestive heart failure ([place])    Last Echo 11/2019 at [facility] reveals EF 20%   Class 3 severe obesity due to excess calories with serious comorbidity and body mass index (BMI) of 50.0 to 59.9 in adult [facility]) 02/26/2020   Essential hypertension 02/26/2020   GERD without esophagitis 02/26/2020   Hidradenitis suppurativa 02/26/2020   OSA (obstructive sleep apnea) 02/26/2020   Prediabetes 02/26/2020   Renal disorder      Family History  Problem Relation Age of Onset   Heart disease Mother    Hypertension Mother    Pulmonary Hypertension Mother    Drug abuse Father        died due to Heroin overdose     Past Surgical History:  Procedure Laterality Date   ABSCESS DRAINAGE     AV FISTULA PLACEMENT Left 08/21/2021   Procedure: LEFT ARM ARTERIOVENOUS (AV) FISTULA.;  Surgeon: Serafina Mitchell, MD;  Location: [place];  Service: Vascular;  Laterality: Left;   CARDIOVERSION N/A 10/09/2021   Procedure: CARDIOVERSION;  Surgeon: Larey Dresser, MD;  Location: [name] ENDOSCOPY;  Service: Cardiovascular;  Laterality: N/A;   CARDIOVERSION N/A 05/28/2022   Procedure: CARDIOVERSION;  Surgeon: Larey Dresser, MD;  Location: [facility] ENDOSCOPY;  Service: Cardiovascular;  Laterality: N/A;   CARDIOVERSION N/A 06/07/2022   Procedure: CARDIOVERSION;  Surgeon: Larey Dresser, MD;  Location: [place];  Service: Cardiovascular;   Laterality: N/A;   IR FLUORO GUIDE CV LINE RIGHT  03/10/2021   IR FLUORO GUIDE CV LINE RIGHT  04/22/2021   IR FLUORO GUIDE CV LINE RIGHT  08/20/2021   IR US GUIDE VASC ACCESS RIGHT  03/10/2021   IR US GUIDE VASC ACCESS RIGHT  04/22/2021   RIGHT HEART CATH N/A 03/06/2021   Procedure: RIGHT HEART CATH;  Surgeon: Jolaine Artist, MD;  Location: [place] CV LAB;  Service: Cardiovascular;  Laterality: N/A;   RIGHT/LEFT HEART CATH AND CORONARY ANGIOGRAPHY N/A 03/04/2020   Procedure: RIGHT/LEFT HEART CATH AND CORONARY ANGIOGRAPHY;  Surgeon: Larey Dresser, MD;  Location: [place] CV LAB;  Service: Cardiovascular;  Laterality: N/A;   TEE WITHOUT CARDIOVERSION N/A 05/05/2021   Procedure: TRANSESOPHAGEAL ECHOCARDIOGRAM (TEE);  Surgeon: Larey Dresser, MD;  Location: [facility] ENDOSCOPY;  Service: Cardiovascular;  Laterality: N/A;   TEMPORARY DIALYSIS CATHETER  03/06/2021   Procedure: TEMPORARY DIALYSIS CATHETER;  Surgeon: Jolaine Artist, MD;  Location: [place] CV LAB;  Service: Cardiovascular;;    Social History   Socioeconomic History   Marital status: Single    Spouse name: Not on file   Number of children: Not on file   Years of education: Not on file   Highest education level: Not on file  Occupational History   Not on file  Tobacco Use   Smoking status: Former    Packs/day: 1.00    Types: Cigarettes    Quit date: 2019    Years since quitting: 4.9    Passive  exposure: Current   Smokeless tobacco: Former   Tobacco comments:    quit in 2019  Vaping Use   Vaping Use: Never used  Substance and Sexual Activity   Alcohol use: Never   Drug use: Never   Sexual activity: Not on file  Other Topics Concern   Not on file  Social History Narrative   Not on file   Social Determinants of Health   Financial Resource Strain: Low Risk  (04/13/2021)   Overall Financial Resource Strain (CARDIA)    Difficulty of Paying Living Expenses: Not very hard  Food Insecurity: No Food  Insecurity (05/23/2022)   Hunger Vital Sign    Worried About Running Out of Food in the Last Year: Never true    Ran Out of Food in the Last Year: Never true  Transportation Needs: No Transportation Needs (05/23/2022)   PRAPARE - Hydrologist (Medical): No    Lack of Transportation (Non-Medical): No  Physical Activity: Not on file  Stress: Not on file  Social Connections: Not on file  Intimate Partner Violence: Not At Risk (05/23/2022)   Humiliation, Afraid, Rape, and Kick questionnaire    Fear of Current or Ex-Partner: No    Emotionally Abused: No    Physically Abused: No    Sexually Abused: No     Allergies  Allergen Reactions   Coreg [Carvedilol] Shortness Of Breath and Diarrhea    Wheezing    Heparin Other (See Comments)    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, SRA positive   Amiodarone Other (See Comments)    Suspicion for amiodarone lung/hepatotoxicity     Outpatient Medications Prior to Visit  Medication Sig Dispense Refill   apixaban (ELIQUIS) 5 MG TABS tablet Take 1 tablet (5 mg total) by mouth 2 (two) times daily. 180 tablet 3   diclofenac Sodium (VOLTAREN) 1 % GEL Apply 4 g topically 3 (three) times daily as needed (joint pains). 150 g 0   ferrous sulfate 325 (65 FE) MG tablet Take 1 tablet (325 mg total) by mouth daily. 30 tablet 3   fluticasone furoate-vilanterol (BREO ELLIPTA) 100-25 MCG/ACT AEPB Inhale 1 puff into the lungs daily. 30 each 0   lanthanum (FOSRENOL) 1000 MG chewable tablet Chew 2,000 mg by mouth 3 (three) times daily with meals.     melatonin 5 MG TABS Take 1 tablet (5 mg total) by mouth at bedtime. 30 tablet 0   Menthol (HALLS COUGH DROPS) 5.8 MG LOZG Use as directed 1 lozenge in the mouth or throat every 4 (four) hours as needed (cough).     metoprolol succinate (TOPROL-XL) 25 MG 24 hr tablet TAKE 1 TABLET(25 MG) BY MOUTH TWICE DAILY 180 tablet 3   midodrine (PROAMATINE) 5 MG tablet TAKE 1 TABLET(5 MG) BY MOUTH THREE TIMES  DAILY WITH MEALS 270 tablet 3   predniSONE (DELTASONE) 20 MG tablet Take 2 tablets (40 mg total) by mouth daily with breakfast for 14 days, THEN 1.5 tablets (30 mg total) daily with breakfast for 14 days, THEN 1 tablet (20 mg total) daily with breakfast for 14 days. 63 tablet 0   torsemide (DEMADEX) 100 MG tablet TAKE 1 TABLET BY MOUTH 3 TIMES A WEEK ON NON-DIALYSIS DAYS. 38 tablet 2   No facility-administered medications prior to visit.       Objective:   Physical Exam:  General appearance: 36 y.o., male, NAD, conversant  Eyes: anicteric sclerae; PERRL, tracking appropriately HENT: NCAT; MMM Neck: Trachea midline; no  lymphadenopathy, no JVD Lungs: CTAB, no crackles, no wheeze, with normal respiratory effort CV: RRR, no murmur  Abdomen: Soft, non-tender; non-distended, BS present  Extremities: No peripheral edema, warm Skin: Normal turgor and texture; no rash Psych: Appropriate affect Neuro: Alert and oriented to person and place, no focal deficit     There were no vitals filed for this visit.   on *** LPM *** RA BMI Readings from Last 3 Encounters:  06/10/22 55.67 kg/m  05/20/22 55.93 kg/m  05/18/22 54.44 kg/m   Wt Readings from Last 3 Encounters:  06/10/22 (!) 410 lb 7.9 oz (186.2 kg)  05/20/22 (!) 401 lb (181.9 kg)  05/18/22 (!) 401 lb 6.4 oz (182.1 kg)     CBC    Component Value Date/Time   WBC 16.2 (H) 06/10/2022 0452   RBC 2.58 (L) 06/10/2022 0452   HGB 9.1 (L) 06/10/2022 0452   HCT 27.5 (L) 06/10/2022 0452   PLT 258 06/10/2022 0452   MCV 106.6 (H) 06/10/2022 0452   MCH 35.3 (H) 06/10/2022 0452   MCHC 33.1 06/10/2022 0452   RDW 17.0 (H) 06/10/2022 0452   LYMPHSABS 0.9 03/26/2022 0215   MONOABS 0.8 03/26/2022 0215   EOSABS 0.4 03/26/2022 0215   BASOSABS 0.1 03/26/2022 0215    ***  Chest Imaging: 05/31/22 CT Chest reviewed by me with multifocal nodular/reticular consolidation, air trapping/mosaicism, mediastinal LAD  Pulmonary Functions Testing  Results:     No data to display          PSG 09/10/21  - Severe obstructive sleep apnea occurred during this study (AHI = 110.4/h). - Moderate oxygen desaturation was noted during this study (Min O2 = 71.0%). - The patient snored with loud snoring volume. - No cardiac abnormalities were noted during this study. - Clinically significant periodic limb movements did not occur during sleep. No significant associated arousals. - total sleep time 12.5 min  Echocardiogram 03/24/22:    1. Left ventricular ejection fraction, by estimation, is 20 to 25%. The  left ventricle has severely decreased function. The left ventricle  demonstrates global hypokinesis. The left ventricular internal cavity size  was moderately dilated. Left  ventricular diastolic parameters were normal.   2. Right ventricular systolic function is normal. The right ventricular  size is mildly enlarged. There is severely elevated pulmonary artery  systolic pressure. The estimated right ventricular systolic pressure is  58.0 mmHg.   3. Left atrial size was mildly dilated.   4. Right atrial size was mildly dilated.   5. The mitral valve is normal in structure. Mild to moderate mitral valve  regurgitation. No evidence of mitral stenosis.   6. The aortic valve is normal in structure. Aortic valve regurgitation is  not visualized. No aortic stenosis is present.   7. The inferior vena cava is normal in size with greater than 50%  respiratory variability, suggesting right atrial pressure of 3 mmHg.   Heart Catheterization: ***    Assessment & Plan:    Plan:      Maryjane Hurter, MD [facility] 07/04/2022 5:23 PM

## 2022-07-06 ENCOUNTER — Inpatient Hospital Stay: Payer: BC Managed Care – PPO | Admitting: Student

## 2022-07-06 DIAGNOSIS — I132 Hypertensive heart and chronic kidney disease with heart failure and with stage 5 chronic kidney disease, or end stage renal disease: Secondary | ICD-10-CM | POA: Diagnosis not present

## 2022-07-06 DIAGNOSIS — D649 Anemia, unspecified: Secondary | ICD-10-CM | POA: Diagnosis not present

## 2022-07-06 DIAGNOSIS — I4892 Unspecified atrial flutter: Secondary | ICD-10-CM | POA: Diagnosis not present

## 2022-07-06 DIAGNOSIS — R1030 Lower abdominal pain, unspecified: Secondary | ICD-10-CM | POA: Diagnosis not present

## 2022-07-06 DIAGNOSIS — L02415 Cutaneous abscess of right lower limb: Secondary | ICD-10-CM | POA: Diagnosis not present

## 2022-07-06 DIAGNOSIS — M65051 Abscess of tendon sheath, right thigh: Secondary | ICD-10-CM | POA: Diagnosis not present

## 2022-07-06 DIAGNOSIS — M79604 Pain in right leg: Secondary | ICD-10-CM | POA: Diagnosis not present

## 2022-07-06 DIAGNOSIS — J9610 Chronic respiratory failure, unspecified whether with hypoxia or hypercapnia: Secondary | ICD-10-CM | POA: Diagnosis not present

## 2022-07-06 DIAGNOSIS — I5020 Unspecified systolic (congestive) heart failure: Secondary | ICD-10-CM | POA: Diagnosis not present

## 2022-07-06 DIAGNOSIS — R5383 Other fatigue: Secondary | ICD-10-CM | POA: Diagnosis not present

## 2022-07-06 DIAGNOSIS — I482 Chronic atrial fibrillation, unspecified: Secondary | ICD-10-CM | POA: Diagnosis not present

## 2022-07-06 DIAGNOSIS — R0602 Shortness of breath: Secondary | ICD-10-CM | POA: Diagnosis not present

## 2022-07-06 DIAGNOSIS — Z79899 Other long term (current) drug therapy: Secondary | ICD-10-CM | POA: Diagnosis not present

## 2022-07-06 DIAGNOSIS — E8770 Fluid overload, unspecified: Secondary | ICD-10-CM | POA: Diagnosis not present

## 2022-07-06 DIAGNOSIS — I517 Cardiomegaly: Secondary | ICD-10-CM | POA: Diagnosis not present

## 2022-07-06 DIAGNOSIS — Z9889 Other specified postprocedural states: Secondary | ICD-10-CM | POA: Diagnosis not present

## 2022-07-06 DIAGNOSIS — I5082 Biventricular heart failure: Secondary | ICD-10-CM | POA: Diagnosis not present

## 2022-07-06 DIAGNOSIS — D72829 Elevated white blood cell count, unspecified: Secondary | ICD-10-CM | POA: Diagnosis not present

## 2022-07-06 DIAGNOSIS — Z7901 Long term (current) use of anticoagulants: Secondary | ICD-10-CM | POA: Diagnosis not present

## 2022-07-06 DIAGNOSIS — I130 Hypertensive heart and chronic kidney disease with heart failure and stage 1 through stage 4 chronic kidney disease, or unspecified chronic kidney disease: Secondary | ICD-10-CM | POA: Diagnosis not present

## 2022-07-06 DIAGNOSIS — D693 Immune thrombocytopenic purpura: Secondary | ICD-10-CM | POA: Diagnosis not present

## 2022-07-06 DIAGNOSIS — M79672 Pain in left foot: Secondary | ICD-10-CM | POA: Diagnosis not present

## 2022-07-06 DIAGNOSIS — I5023 Acute on chronic systolic (congestive) heart failure: Secondary | ICD-10-CM | POA: Diagnosis not present

## 2022-07-06 DIAGNOSIS — I480 Paroxysmal atrial fibrillation: Secondary | ICD-10-CM | POA: Diagnosis not present

## 2022-07-06 DIAGNOSIS — I2489 Other forms of acute ischemic heart disease: Secondary | ICD-10-CM | POA: Diagnosis not present

## 2022-07-06 DIAGNOSIS — D696 Thrombocytopenia, unspecified: Secondary | ICD-10-CM | POA: Diagnosis not present

## 2022-07-06 DIAGNOSIS — E872 Acidosis, unspecified: Secondary | ICD-10-CM | POA: Diagnosis not present

## 2022-07-06 DIAGNOSIS — E662 Morbid (severe) obesity with alveolar hypoventilation: Secondary | ICD-10-CM | POA: Diagnosis not present

## 2022-07-06 DIAGNOSIS — E871 Hypo-osmolality and hyponatremia: Secondary | ICD-10-CM | POA: Diagnosis not present

## 2022-07-06 DIAGNOSIS — R609 Edema, unspecified: Secondary | ICD-10-CM | POA: Diagnosis not present

## 2022-07-06 DIAGNOSIS — Z91158 Patient's noncompliance with renal dialysis for other reason: Secondary | ICD-10-CM | POA: Diagnosis not present

## 2022-07-06 DIAGNOSIS — R600 Localized edema: Secondary | ICD-10-CM | POA: Diagnosis not present

## 2022-07-06 DIAGNOSIS — Z20822 Contact with and (suspected) exposure to covid-19: Secondary | ICD-10-CM | POA: Diagnosis not present

## 2022-07-06 DIAGNOSIS — I509 Heart failure, unspecified: Secondary | ICD-10-CM | POA: Diagnosis not present

## 2022-07-06 DIAGNOSIS — J9621 Acute and chronic respiratory failure with hypoxia: Secondary | ICD-10-CM | POA: Diagnosis not present

## 2022-07-06 DIAGNOSIS — M7989 Other specified soft tissue disorders: Secondary | ICD-10-CM | POA: Diagnosis not present

## 2022-07-06 DIAGNOSIS — Z992 Dependence on renal dialysis: Secondary | ICD-10-CM | POA: Diagnosis not present

## 2022-07-06 DIAGNOSIS — I272 Pulmonary hypertension, unspecified: Secondary | ICD-10-CM | POA: Diagnosis not present

## 2022-07-06 DIAGNOSIS — R918 Other nonspecific abnormal finding of lung field: Secondary | ICD-10-CM | POA: Diagnosis not present

## 2022-07-06 DIAGNOSIS — I4891 Unspecified atrial fibrillation: Secondary | ICD-10-CM | POA: Diagnosis not present

## 2022-07-06 DIAGNOSIS — D631 Anemia in chronic kidney disease: Secondary | ICD-10-CM | POA: Diagnosis not present

## 2022-07-06 DIAGNOSIS — N186 End stage renal disease: Secondary | ICD-10-CM | POA: Diagnosis not present

## 2022-07-06 DIAGNOSIS — J90 Pleural effusion, not elsewhere classified: Secondary | ICD-10-CM | POA: Diagnosis not present

## 2022-07-06 DIAGNOSIS — L03115 Cellulitis of right lower limb: Secondary | ICD-10-CM | POA: Diagnosis not present

## 2022-07-06 DIAGNOSIS — Z515 Encounter for palliative care: Secondary | ICD-10-CM | POA: Diagnosis not present

## 2022-07-06 DIAGNOSIS — E1122 Type 2 diabetes mellitus with diabetic chronic kidney disease: Secondary | ICD-10-CM | POA: Diagnosis not present

## 2022-07-06 DIAGNOSIS — J9601 Acute respiratory failure with hypoxia: Secondary | ICD-10-CM | POA: Diagnosis not present

## 2022-07-06 DIAGNOSIS — Z888 Allergy status to other drugs, medicaments and biological substances status: Secondary | ICD-10-CM | POA: Diagnosis not present

## 2022-07-06 DIAGNOSIS — D7589 Other specified diseases of blood and blood-forming organs: Secondary | ICD-10-CM | POA: Diagnosis not present

## 2022-07-07 ENCOUNTER — Encounter: Payer: BC Managed Care – PPO | Admitting: Internal Medicine

## 2022-07-15 DIAGNOSIS — J9610 Chronic respiratory failure, unspecified whether with hypoxia or hypercapnia: Secondary | ICD-10-CM | POA: Diagnosis not present

## 2022-07-15 DIAGNOSIS — I509 Heart failure, unspecified: Secondary | ICD-10-CM | POA: Diagnosis not present

## 2022-07-15 DIAGNOSIS — I5023 Acute on chronic systolic (congestive) heart failure: Secondary | ICD-10-CM | POA: Diagnosis not present

## 2022-07-21 ENCOUNTER — Telehealth (HOSPITAL_COMMUNITY): Payer: Self-pay

## 2022-07-21 ENCOUNTER — Encounter (HOSPITAL_COMMUNITY): Payer: BC Managed Care – PPO

## 2022-07-21 NOTE — Telephone Encounter (Signed)
Forms faxed on 07/21/22. Unable to reach patient as he remains in hospital at [facility]

## 2022-08-03 ENCOUNTER — Telehealth (HOSPITAL_COMMUNITY): Payer: Self-pay

## 2022-08-03 NOTE — Telephone Encounter (Signed)
Phillip Young from [employer] [place] called requesting additional information for patients short term disability claim. Her call back number is [phone]

## 2022-08-04 DIAGNOSIS — R5383 Other fatigue: Secondary | ICD-10-CM | POA: Diagnosis not present

## 2022-08-04 DIAGNOSIS — R609 Edema, unspecified: Secondary | ICD-10-CM | POA: Diagnosis not present

## 2022-08-04 DIAGNOSIS — Z7901 Long term (current) use of anticoagulants: Secondary | ICD-10-CM | POA: Diagnosis not present

## 2022-08-04 DIAGNOSIS — M7989 Other specified soft tissue disorders: Secondary | ICD-10-CM | POA: Diagnosis not present

## 2022-08-04 DIAGNOSIS — R600 Localized edema: Secondary | ICD-10-CM | POA: Diagnosis not present

## 2022-08-04 DIAGNOSIS — N186 End stage renal disease: Secondary | ICD-10-CM | POA: Diagnosis not present

## 2022-08-04 DIAGNOSIS — D631 Anemia in chronic kidney disease: Secondary | ICD-10-CM | POA: Diagnosis not present

## 2022-08-04 DIAGNOSIS — Z79899 Other long term (current) drug therapy: Secondary | ICD-10-CM | POA: Diagnosis not present

## 2022-08-04 DIAGNOSIS — Z20822 Contact with and (suspected) exposure to covid-19: Secondary | ICD-10-CM | POA: Diagnosis not present

## 2022-08-04 DIAGNOSIS — Z992 Dependence on renal dialysis: Secondary | ICD-10-CM | POA: Diagnosis not present

## 2022-08-04 DIAGNOSIS — Z888 Allergy status to other drugs, medicaments and biological substances status: Secondary | ICD-10-CM | POA: Diagnosis not present

## 2022-08-04 DIAGNOSIS — R0602 Shortness of breath: Secondary | ICD-10-CM | POA: Diagnosis not present

## 2022-08-05 ENCOUNTER — Encounter (HOSPITAL_COMMUNITY): Payer: Self-pay

## 2022-08-05 ENCOUNTER — Ambulatory Visit (HOSPITAL_COMMUNITY)
Admission: RE | Admit: 2022-08-05 | Disposition: A | Discharge status: Home / Self Care | Payer: BC Managed Care – PPO | Source: Ambulatory Visit

## 2022-08-05 ENCOUNTER — Ambulatory Visit (INDEPENDENT_AMBULATORY_CARE_PROVIDER_SITE_OTHER): Payer: BC Managed Care – PPO | Admitting: Internal Medicine

## 2022-08-05 ENCOUNTER — Inpatient Hospital Stay (HOSPITAL_COMMUNITY)
Admission: EM | Admit: 2022-08-05 | Discharge: 2022-08-13 | DRG: 640 | Disposition: A | Discharge status: Home Health | Payer: BC Managed Care – PPO | Attending: Infectious Diseases | Admitting: Infectious Diseases

## 2022-08-05 ENCOUNTER — Emergency Department (HOSPITAL_COMMUNITY): Payer: BC Managed Care – PPO

## 2022-08-05 ENCOUNTER — Encounter: Payer: Self-pay | Admitting: Internal Medicine

## 2022-08-05 ENCOUNTER — Other Ambulatory Visit: Payer: Self-pay

## 2022-08-05 ENCOUNTER — Telehealth (HOSPITAL_COMMUNITY): Payer: Self-pay

## 2022-08-05 VITALS — BP 104/64 | HR 109 | Temp 99.1°F | Wt >= 6400 oz

## 2022-08-05 DIAGNOSIS — R000 Tachycardia, unspecified: Secondary | ICD-10-CM | POA: Insufficient documentation

## 2022-08-05 DIAGNOSIS — I4892 Unspecified atrial flutter: Secondary | ICD-10-CM | POA: Diagnosis not present

## 2022-08-05 DIAGNOSIS — I5023 Acute on chronic systolic (congestive) heart failure: Secondary | ICD-10-CM

## 2022-08-05 DIAGNOSIS — E662 Morbid (severe) obesity with alveolar hypoventilation: Secondary | ICD-10-CM | POA: Diagnosis present

## 2022-08-05 DIAGNOSIS — G4700 Insomnia, unspecified: Secondary | ICD-10-CM | POA: Diagnosis present

## 2022-08-05 DIAGNOSIS — R0602 Shortness of breath: Secondary | ICD-10-CM | POA: Diagnosis not present

## 2022-08-05 DIAGNOSIS — D631 Anemia in chronic kidney disease: Secondary | ICD-10-CM | POA: Diagnosis present

## 2022-08-05 DIAGNOSIS — Z813 Family history of other psychoactive substance abuse and dependence: Secondary | ICD-10-CM

## 2022-08-05 DIAGNOSIS — Z7951 Long term (current) use of inhaled steroids: Secondary | ICD-10-CM

## 2022-08-05 DIAGNOSIS — Z992 Dependence on renal dialysis: Secondary | ICD-10-CM

## 2022-08-05 DIAGNOSIS — Z87891 Personal history of nicotine dependence: Secondary | ICD-10-CM

## 2022-08-05 DIAGNOSIS — G4733 Obstructive sleep apnea (adult) (pediatric): Secondary | ICD-10-CM | POA: Diagnosis present

## 2022-08-05 DIAGNOSIS — J9610 Chronic respiratory failure, unspecified whether with hypoxia or hypercapnia: Secondary | ICD-10-CM | POA: Diagnosis not present

## 2022-08-05 DIAGNOSIS — I5033 Acute on chronic diastolic (congestive) heart failure: Secondary | ICD-10-CM | POA: Insufficient documentation

## 2022-08-05 DIAGNOSIS — Z91158 Patient's noncompliance with renal dialysis for other reason: Secondary | ICD-10-CM

## 2022-08-05 DIAGNOSIS — Z5320 Procedure and treatment not carried out because of patient's decision for unspecified reasons: Secondary | ICD-10-CM | POA: Diagnosis not present

## 2022-08-05 DIAGNOSIS — L732 Hidradenitis suppurativa: Secondary | ICD-10-CM | POA: Diagnosis present

## 2022-08-05 DIAGNOSIS — R600 Localized edema: Secondary | ICD-10-CM

## 2022-08-05 DIAGNOSIS — I5082 Biventricular heart failure: Secondary | ICD-10-CM | POA: Insufficient documentation

## 2022-08-05 DIAGNOSIS — Z7901 Long term (current) use of anticoagulants: Secondary | ICD-10-CM

## 2022-08-05 DIAGNOSIS — I480 Paroxysmal atrial fibrillation: Secondary | ICD-10-CM | POA: Diagnosis not present

## 2022-08-05 DIAGNOSIS — I132 Hypertensive heart and chronic kidney disease with heart failure and with stage 5 chronic kidney disease, or end stage renal disease: Secondary | ICD-10-CM | POA: Diagnosis not present

## 2022-08-05 DIAGNOSIS — Z8249 Family history of ischemic heart disease and other diseases of the circulatory system: Secondary | ICD-10-CM

## 2022-08-05 DIAGNOSIS — Z6841 Body Mass Index (BMI) 40.0 and over, adult: Secondary | ICD-10-CM | POA: Diagnosis not present

## 2022-08-05 DIAGNOSIS — Z79899 Other long term (current) drug therapy: Secondary | ICD-10-CM | POA: Diagnosis not present

## 2022-08-05 DIAGNOSIS — J9621 Acute and chronic respiratory failure with hypoxia: Secondary | ICD-10-CM | POA: Diagnosis not present

## 2022-08-05 DIAGNOSIS — I878 Other specified disorders of veins: Secondary | ICD-10-CM | POA: Diagnosis present

## 2022-08-05 DIAGNOSIS — I959 Hypotension, unspecified: Secondary | ICD-10-CM | POA: Diagnosis present

## 2022-08-05 DIAGNOSIS — K219 Gastro-esophageal reflux disease without esophagitis: Secondary | ICD-10-CM | POA: Diagnosis present

## 2022-08-05 DIAGNOSIS — I509 Heart failure, unspecified: Secondary | ICD-10-CM | POA: Diagnosis not present

## 2022-08-05 DIAGNOSIS — I471 Supraventricular tachycardia, unspecified: Secondary | ICD-10-CM | POA: Diagnosis present

## 2022-08-05 DIAGNOSIS — T8249XA Other complication of vascular dialysis catheter, initial encounter: Secondary | ICD-10-CM | POA: Diagnosis not present

## 2022-08-05 DIAGNOSIS — D696 Thrombocytopenia, unspecified: Secondary | ICD-10-CM | POA: Diagnosis present

## 2022-08-05 DIAGNOSIS — M7989 Other specified soft tissue disorders: Secondary | ICD-10-CM | POA: Diagnosis not present

## 2022-08-05 DIAGNOSIS — T462X5A Adverse effect of other antidysrhythmic drugs, initial encounter: Secondary | ICD-10-CM | POA: Diagnosis present

## 2022-08-05 DIAGNOSIS — I5043 Acute on chronic combined systolic (congestive) and diastolic (congestive) heart failure: Secondary | ICD-10-CM | POA: Diagnosis present

## 2022-08-05 DIAGNOSIS — Z1152 Encounter for screening for COVID-19: Secondary | ICD-10-CM

## 2022-08-05 DIAGNOSIS — I428 Other cardiomyopathies: Secondary | ICD-10-CM | POA: Diagnosis not present

## 2022-08-05 DIAGNOSIS — I340 Nonrheumatic mitral (valve) insufficiency: Secondary | ICD-10-CM | POA: Diagnosis present

## 2022-08-05 DIAGNOSIS — Z888 Allergy status to other drugs, medicaments and biological substances status: Secondary | ICD-10-CM

## 2022-08-05 DIAGNOSIS — I429 Cardiomyopathy, unspecified: Secondary | ICD-10-CM | POA: Diagnosis not present

## 2022-08-05 DIAGNOSIS — S81802D Unspecified open wound, left lower leg, subsequent encounter: Secondary | ICD-10-CM

## 2022-08-05 DIAGNOSIS — N2581 Secondary hyperparathyroidism of renal origin: Secondary | ICD-10-CM | POA: Diagnosis not present

## 2022-08-05 DIAGNOSIS — R609 Edema, unspecified: Secondary | ICD-10-CM

## 2022-08-05 DIAGNOSIS — E8779 Other fluid overload: Secondary | ICD-10-CM | POA: Diagnosis not present

## 2022-08-05 DIAGNOSIS — T380X5A Adverse effect of glucocorticoids and synthetic analogues, initial encounter: Secondary | ICD-10-CM | POA: Diagnosis present

## 2022-08-05 DIAGNOSIS — R7303 Prediabetes: Secondary | ICD-10-CM | POA: Diagnosis present

## 2022-08-05 DIAGNOSIS — R5383 Other fatigue: Secondary | ICD-10-CM | POA: Diagnosis not present

## 2022-08-05 DIAGNOSIS — D509 Iron deficiency anemia, unspecified: Secondary | ICD-10-CM | POA: Diagnosis present

## 2022-08-05 DIAGNOSIS — S81801D Unspecified open wound, right lower leg, subsequent encounter: Secondary | ICD-10-CM

## 2022-08-05 DIAGNOSIS — N186 End stage renal disease: Secondary | ICD-10-CM | POA: Diagnosis not present

## 2022-08-05 DIAGNOSIS — J811 Chronic pulmonary edema: Secondary | ICD-10-CM | POA: Diagnosis not present

## 2022-08-05 DIAGNOSIS — Z20822 Contact with and (suspected) exposure to covid-19: Secondary | ICD-10-CM | POA: Diagnosis not present

## 2022-08-05 DIAGNOSIS — I5030 Unspecified diastolic (congestive) heart failure: Secondary | ICD-10-CM | POA: Diagnosis not present

## 2022-08-05 LAB — CBC
HCT: 39.3 % (ref 39.0–52.0)
Hemoglobin: 12.7 g/dL — ABNORMAL LOW (ref 13.0–17.0)
MCH: 28.4 pg (ref 26.0–34.0)
MCHC: 32.3 g/dL (ref 30.0–36.0)
MCV: 87.9 fL (ref 80.0–100.0)
Platelets: 357 10*3/uL (ref 150–400)
RBC: 4.47 MIL/uL (ref 4.22–5.81)
RDW: 12.9 % (ref 11.5–15.5)
WBC: 8.8 10*3/uL (ref 4.0–10.5)
nRBC: 0 % (ref 0.0–0.2)

## 2022-08-05 LAB — RESP PANEL BY RT-PCR (RSV, FLU A&B, COVID)  RVPGX2
Influenza A by PCR: NEGATIVE
Influenza B by PCR: NEGATIVE
Resp Syncytial Virus by PCR: NEGATIVE
SARS Coronavirus 2 by RT PCR: NEGATIVE

## 2022-08-05 LAB — COMPREHENSIVE METABOLIC PANEL
ALT: 23 U/L (ref 0–44)
AST: 26 U/L (ref 15–41)
Albumin: 2.9 g/dL — ABNORMAL LOW (ref 3.5–5.0)
Alkaline Phosphatase: 137 U/L — ABNORMAL HIGH (ref 38–126)
Anion gap: 15 (ref 5–15)
BUN: 25 mg/dL — ABNORMAL HIGH (ref 6–20)
CO2: 21 mmol/L — ABNORMAL LOW (ref 22–32)
Calcium: 9 mg/dL (ref 8.9–10.3)
Chloride: 96 mmol/L — ABNORMAL LOW (ref 98–111)
Creatinine, Ser: 4.53 mg/dL — ABNORMAL HIGH (ref 0.61–1.24)
GFR, Estimated: 16 mL/min — ABNORMAL LOW (ref >60)
Glucose, Bld: 74 mg/dL (ref 70–99)
Potassium: 4.1 mmol/L (ref 3.5–5.1)
Sodium: 132 mmol/L — ABNORMAL LOW (ref 135–145)
Total Bilirubin: 0.5 mg/dL (ref 0.3–1.2)
Total Protein: 7.5 g/dL (ref 6.5–8.1)

## 2022-08-05 LAB — LACTIC ACID, PLASMA
Lactic Acid, Venous: 2.4 mmol/L (ref 0.5–1.9)
Lactic Acid, Venous: 1.6 mmol/L (ref 0.5–1.9)

## 2022-08-05 LAB — BRAIN NATRIURETIC PEPTIDE: B Natriuretic Peptide: 15.5 pg/mL (ref 0.0–100.0)

## 2022-08-05 LAB — TROPONIN I (HIGH SENSITIVITY)
Troponin I (High Sensitivity): 14 ng/L (ref <18)
Troponin I (High Sensitivity): 51 ng/L — ABNORMAL HIGH (ref <18)

## 2022-08-05 MED ORDER — FLUTICASONE FUROATE-VILANTEROL 100-25 MCG/ACT IN AEPB
1.0000 | INHALATION_SPRAY | Freq: Every day | RESPIRATORY_TRACT | Status: DC
  Administered 2022-08-06 – 2022-08-13 (×2): 1 via RESPIRATORY_TRACT
  Filled 2022-08-05: qty 28

## 2022-08-05 MED ORDER — MIDODRINE HCL 5 MG PO TABS: 5.0000 mg | ORAL_TABLET | Freq: Three times a day (TID) | ORAL | Status: DC

## 2022-08-05 MED ORDER — HYDROMORPHONE HCL 2 MG PO TABS
1.0000 mg | ORAL_TABLET | Freq: Four times a day (QID) | ORAL | Status: DC | PRN
  Administered 2022-08-06: 1 mg via ORAL
  Filled 2022-08-05 (×2): qty 1

## 2022-08-05 MED ORDER — MELATONIN 5 MG PO TABS
5.0000 mg | ORAL_TABLET | Freq: Every day | ORAL | Status: DC
  Administered 2022-08-06 – 2022-08-09 (×3): 5 mg via ORAL
  Filled 2022-08-05 (×4): qty 1

## 2022-08-05 MED ORDER — LANTHANUM CARBONATE 500 MG PO CHEW
2000.0000 mg | CHEWABLE_TABLET | Freq: Three times a day (TID) | ORAL | Status: DC
  Administered 2022-08-06: 2000 mg via ORAL
  Filled 2022-08-05 (×3): qty 4

## 2022-08-05 MED ORDER — ACETAMINOPHEN 650 MG RE SUPP: 650.0000 mg | Freq: Four times a day (QID) | RECTAL | Status: DC | PRN

## 2022-08-05 MED ORDER — OXYCODONE HCL 5 MG PO TABS
10.0000 mg | ORAL_TABLET | Freq: Once | ORAL | Status: DC
  Administered 2022-08-05: 10 mg via ORAL
  Filled 2022-08-05: qty 2

## 2022-08-05 MED ORDER — DOXYCYCLINE HYCLATE 100 MG PO TABS
100.0000 mg | ORAL_TABLET | Freq: Two times a day (BID) | ORAL | Status: AC
  Administered 2022-08-06 – 2022-08-11 (×11): 100 mg via ORAL
  Filled 2022-08-05 (×11): qty 1

## 2022-08-05 MED ORDER — ACETAMINOPHEN 325 MG PO TABS
650.0000 mg | ORAL_TABLET | Freq: Four times a day (QID) | ORAL | Status: DC | PRN
  Administered 2022-08-09: 650 mg via ORAL
  Filled 2022-08-05: qty 2

## 2022-08-05 MED ORDER — APIXABAN 5 MG PO TABS
5.0000 mg | ORAL_TABLET | Freq: Two times a day (BID) | ORAL | Status: DC
  Administered 2022-08-05 – 2022-08-13 (×8): 5 mg via ORAL
  Filled 2022-08-05 (×12): qty 1

## 2022-08-05 NOTE — ED Notes (Signed)
Admitting MD present at bedside going over admission plans for patient

## 2022-08-05 NOTE — ED Provider Notes (Signed)
[facility] EMERGENCY DEPARTMENT Provider Note   CSN: [id number] Arrival date & time: 08/05/22  1707     History  Chief Complaint  Patient presents with   Shortness of Breath    Phillip Young is a 37 y.o. male.  Patient presents to the emergency department complaining of lower extremity edema with weeping and also complaining of shortness of breath.  Patient was discharged yesterday from [facility] after spending approximately 1 month in the hospital due to heart failure exacerbation.  Last night the patient went to [facility] health because he was still feeling short of breath.  [facility] deemed him stable for discharge and advised when to return to the [facility] emergency department for continued workup and treatment.  He went home last night and this morning decided to go to the [facility] internal medicine center mainly due to his bilateral lower extremity pain, edema, and weeping.  Upon arrival he was noted to be in acute respiratory distress.  He had 4+ pitting edema to bilateral extremities with weeping and increased erythema.  The patient has skin breakdown over the lateral malleolus of the left ankle.  He has gained 15 pounds since his last hospitalization at [facility], approximately 1 month ago.  This service considered direct admission but there were no progressive beds available so they sent him to the emergency department for workup.  The patient currently complains of shortness of breath and the bilateral lower extremity pain with erythema, edema, weeping.  Past medical history significant for chronic systolic heart failure, renal disorder, hidradenitis suppurativa, severe obesity, GERD, chronic respiratory failure, hypertension, end-stage renal disease on dialysis, paroxysmal atrial flutter on Eliquis (recent ablation but still on Eliquis)  HPI     Home Medications Prior to Admission medications   Medication Sig Start Date End Date Taking?  Authorizing Provider  apixaban (ELIQUIS) 5 MG TABS tablet Take 1 tablet (5 mg total) by mouth 2 (two) times daily. 12/16/21   Larey Dresser, MD  diclofenac Sodium (VOLTAREN) 1 % GEL Apply 4 g topically 3 (three) times daily as needed (joint pains). 03/28/22   Lacinda Axon, MD  ferrous sulfate 325 (65 FE) MG tablet Take 1 tablet (325 mg total) by mouth daily. 06/10/22 06/10/23  Serita Butcher, MD  fluticasone furoate-vilanterol (BREO ELLIPTA) 100-25 MCG/ACT AEPB Inhale 1 puff into the lungs daily. 06/11/22   Serita Butcher, MD  lanthanum (FOSRENOL) 1000 MG chewable tablet Chew 2,000 mg by mouth 3 (three) times daily with meals.    [provider]  melatonin 5 MG TABS Take 1 tablet (5 mg total) by mouth at bedtime. 06/10/22   Serita Butcher, MD  Menthol (HALLS COUGH DROPS) 5.8 MG LOZG Use as directed 1 lozenge in the mouth or throat every 4 (four) hours as needed (cough).    [provider]  metoprolol succinate (TOPROL-XL) 25 MG 24 hr tablet TAKE 1 TABLET(25 MG) BY MOUTH TWICE DAILY 06/10/22   Katsadouros, Vasilios, MD  midodrine (PROAMATINE) 5 MG tablet TAKE 1 TABLET(5 MG) BY MOUTH THREE TIMES DAILY WITH MEALS 06/10/22   Katsadouros, Vasilios, MD  torsemide (DEMADEX) 100 MG tablet TAKE 1 TABLET BY MOUTH 3 TIMES A WEEK ON NON-DIALYSIS DAYS. 06/10/22   [name], MD      Allergies    Coreg [carvedilol], Heparin, and Amiodarone    Review of Systems   Review of Systems  Constitutional:  Positive for unexpected weight change.  Respiratory:  Positive for shortness  of breath. Negative for cough.   Cardiovascular:  Positive for leg swelling.  Skin:  Positive for wound.  Neurological:  Negative for weakness and headaches.    Physical Exam Updated Vital Signs BP (!) 119/104 (BP Location: Right Arm)   Pulse (!) 105   Temp 99.9 F (37.7 C) (Oral)   Resp 20   SpO2 99%  Physical Exam Vitals and nursing note reviewed.  Constitutional:      General: He is in  acute distress.     Appearance: He is well-developed. He is obese.  HENT:     Head: Normocephalic and atraumatic.  Eyes:     Extraocular Movements: Extraocular movements intact.     Conjunctiva/sclera: Conjunctivae normal.  Cardiovascular:     Rate and Rhythm: Regular rhythm. Tachycardia present.  Pulmonary:     Effort: Tachypnea and respiratory distress present.  Chest:     Chest wall: No tenderness.  Abdominal:     Palpations: Abdomen is soft.     Tenderness: There is no abdominal tenderness.  Musculoskeletal:        General: No swelling.     Cervical back: Neck supple.     Right lower leg: Tenderness present. Edema present.     Left lower leg: Tenderness present. Edema present.     Comments: +4 pitting edema bilateral lower extremities with areas of weeping.  Erythema and skin breakdown noted over the lateral malleolus on the left.  Multiple small bleeding wounds covered with bandages on lower extremities  Skin:    General: Skin is warm and dry.     Capillary Refill: Capillary refill takes less than 2 seconds.  Neurological:     Mental Status: He is alert.  Psychiatric:        Mood and Affect: Mood normal.     ED Results / Procedures / Treatments   Labs (all labs ordered are listed, but only abnormal results are displayed) Labs Reviewed  CBC - Abnormal; Notable for the following components:      Result Value   Hemoglobin 12.7 (*)    All other components within normal limits  COMPREHENSIVE METABOLIC PANEL - Abnormal; Notable for the following components:   Sodium 132 (*)    Chloride 96 (*)    CO2 21 (*)    BUN 25 (*)    Creatinine, Ser 4.53 (*)    Albumin 2.9 (*)    Alkaline Phosphatase 137 (*)    GFR, Estimated 16 (*)    All other components within normal limits  RESP PANEL BY RT-PCR (RSV, FLU A&B, COVID)  RVPGX2  CULTURE, BLOOD (ROUTINE X 2)  CULTURE, BLOOD (ROUTINE X 2)  BRAIN NATRIURETIC PEPTIDE  LACTIC ACID, PLASMA  LACTIC ACID, PLASMA  I-STAT ARTERIAL  BLOOD GAS, ED  TROPONIN I (HIGH SENSITIVITY)  TROPONIN I (HIGH SENSITIVITY)    EKG None  Radiology No results found.  Procedures Procedures    Medications Ordered in ED Medications - No data to display  ED Course/ Medical Decision Making/ A&P                           Medical Decision Making Amount and/or Complexity of Data Reviewed Labs: ordered. Radiology: ordered.   This patient presents to the ED for concern of shortness of breath, this involves an extensive number of treatment options, and is a complaint that carries with it a high risk of complications and morbidity.  The differential diagnosis  includes CHF exacerbation, respiratory illness, COPD, ACS, NSTEMI, and others   Co morbidities that complicate the patient evaluation  History of heart failure, paroxysmal atrial flutter   Additional history obtained:   External records from outside source obtained and reviewed including notes from the internal medicine center from today and discharge summary from yesterday from [facility]   Lab Tests:  I Ordered, and personally interpreted labs.  The pertinent results include: Creatinine 4.53 (improved from baseline), hemoglobin 12.7 (improved from recent results) BNP 15.5 (greatly improved from most recent results)   Imaging Studies ordered:  I ordered imaging studies including chest x-ray I independently visualized and interpreted imaging which showed Cardiomegaly and central pulmonary vascular congestion  I agree with the radiologist interpretation   Test / Admission - Considered:  Patient care being transferred to [place] at shift handoff.  Patient will likely need admission due to increased work of breathing.  Because of patient's respiratory distress is currently unclear.  Troponin, ABG pending at this time        Final Clinical Impression(s) / ED Diagnoses Final diagnoses:  None    Rx / DC Orders ED Discharge Orders     None          Ronny Bacon 08/05/22 1908    Wyvonnia Dusky, MD 08/05/22 [phone]

## 2022-08-05 NOTE — H&P (Signed)
Date: 08/06/2022               Patient Name:  Phillip Young MRN: [id number]  DOB: 22-Apr-1986 Age / Sex: 37 y.o., male   PCP: [name], MD         Medical Service: Internal Medicine Teaching Service         Attending Physician: Dr. Charise Killian, MD    First Contact: Drucie Opitz, MD      Pager: Bethann Goo [phone]      Second Contact: Christiana Fuchs, DO      Pager: [place] Dec [phone]           After Hours (After 5p/  First Contact Pager: [phone]  weekends / holidays): Second Contact Pager: [phone]   SUBJECTIVE   Chief Complaint: breath shortness and leg swelling   History of Present Illness:  Phillip Young is a 37 year old male with a past medical history of morbid obesity, HFrEF, AFib-AFlutter, ESRD, OSA, OHS, and IAD who presents with breath shortness and leg swelling.  Patient was hospitalized at [facility] in 05-2022 for amiodarone toxicity, heart failure, and atrial fibrillation. After discharge, he visited [place] for Thanksgiving and started to feel worse. At that time, he prematurely terminated some of his hemodialysis sessions. He specifically reported headache, feet pain, and high blood pressure that prompted him to seek medical attention at [facility]. About one week later, he was discharged in stable condition but then developed similar symptoms requiring another hospitalization on 12-12. While in the hospital his heart rate increased to the 180s, which required ICU-level care. He was given dronedarone, but refused to take it out of concern for toxic side effects.  He reports full adherence to his currently prescribed medications including apixaban, gabapentin, oxycodone, doxycycline, and torsemide. Additionally, he completes hemodialysis thrice weekly.   ED Course: Upon arrival to the ED visit were notable for HR 112, BP 119/104, RR 24, and SpO2 99% on 6L/min. Laboratory testing demonstrated Na 132, Cl 96, Bicarb 21, BUN 25, Cr 4.53, Alb 2.9, AlkPhos 137, GFR  16, and LA 2.4. Chest radiograph revealed cardiomegaly and central pulmonary vascular congestion. Electrocardiogram showed sinus tachycardia without AFib or AFlutter.   Meds:  No outpatient medications have been marked as taking for the 08/05/22 encounter [facility] Encounter).    Past Medical History  Past Surgical History:  Procedure Laterality Date   ABSCESS DRAINAGE     AV FISTULA PLACEMENT Left 08/21/2021   Procedure: LEFT ARM ARTERIOVENOUS (AV) FISTULA.;  Surgeon: Serafina Mitchell, MD;  Location: [place];  Service: Vascular;  Laterality: Left;   CARDIOVERSION N/A 10/09/2021   Procedure: CARDIOVERSION;  Surgeon: Larey Dresser, MD;  Location: [facility] ENDOSCOPY;  Service: Cardiovascular;  Laterality: N/A;   CARDIOVERSION N/A 05/28/2022   Procedure: CARDIOVERSION;  Surgeon: Larey Dresser, MD;  Location: [facility] ENDOSCOPY;  Service: Cardiovascular;  Laterality: N/A;   CARDIOVERSION N/A 06/07/2022   Procedure: CARDIOVERSION;  Surgeon: Larey Dresser, MD;  Location: [place];  Service: Cardiovascular;  Laterality: N/A;   IR FLUORO GUIDE CV LINE RIGHT  03/10/2021   IR FLUORO GUIDE CV LINE RIGHT  04/22/2021   IR FLUORO GUIDE CV LINE RIGHT  08/20/2021   IR US GUIDE VASC ACCESS RIGHT  03/10/2021   IR US GUIDE VASC ACCESS RIGHT  04/22/2021   RIGHT HEART CATH N/A 03/06/2021   Procedure: RIGHT HEART CATH;  Surgeon: Jolaine Artist, MD;  Location: [place] CV LAB;  Service: Cardiovascular;  Laterality: N/A;   RIGHT/LEFT  HEART CATH AND CORONARY ANGIOGRAPHY N/A 03/04/2020   Procedure: RIGHT/LEFT HEART CATH AND CORONARY ANGIOGRAPHY;  Surgeon: Larey Dresser, MD;  Location: [place] CV LAB;  Service: Cardiovascular;  Laterality: N/A;   TEE WITHOUT CARDIOVERSION N/A 05/05/2021   Procedure: TRANSESOPHAGEAL ECHOCARDIOGRAM (TEE);  Surgeon: Larey Dresser, MD;  Location: [facility] ENDOSCOPY;  Service: Cardiovascular;  Laterality: N/A;   TEMPORARY DIALYSIS CATHETER  03/06/2021   Procedure: TEMPORARY DIALYSIS  CATHETER;  Surgeon: Jolaine Artist, MD;  Location: [place] CV LAB;  Service: Cardiovascular;;     Social:  Lives With: uncle in [place] Occupation: formerly bus driver Support: family Level of Function: formerly independent in ADLs, ambulates with walker PCP: Sanjuana Letters DO Substances: none    Family History:   Family History  Problem Relation Age of Onset   Heart disease Mother    Hypertension Mother    Pulmonary Hypertension Mother    Drug abuse Father        died due to Heroin overdose  Myocardial Infarction age 102yr  Father    Allergies: Allergies as of 08/05/2022 - Review Complete 08/05/2022  Allergen Reaction Noted   Coreg [carvedilol] Shortness Of Breath and Diarrhea 07/07/2020   Heparin Other (See Comments) 03/05/2021   Amiodarone Other (See Comments) 06/01/2022      Review of Systems: A complete ROS was negative except as per HPI.    OBJECTIVE:   Physical Exam: Blood pressure (!) 119/104, pulse (!) 105, temperature 99.9 F (37.7 C), temperature source Oral, resp. rate 20, SpO2 99 %.   General:      awake and alert, lying uncomfortably in bed, cooperative, mild distress Skin:       warm and dry, open 3-5cm lesion on left lateral malleolus with serous drainage, significant tenderness to light touch across lower extremities bilaterally Lungs:      increased respiratory effort, breathing significantly labored, very mild diffuse crackles, no wheezing Cardiac:      sinus tachycardia, normal S1 and S2, capillary refill 2-3 seconds, 4+ pitting edema, markedly swollen lower extremities with weeping wound on left foot Abdomen:      soft and non-distended, no tenderness to palpation or guarding Neurologic:      oriented to person-place-time, moving all extremities, no gross focal deficits Psychiatric:      euthymic mood with congruent affect, intelligible speech     Labs: CBC    Component Value Date/Time   WBC 8.8 08/05/2022 1720    RBC 4.47 08/05/2022 1720   HGB 12.7 (L) 08/05/2022 1720   HCT 39.3 08/05/2022 1720   PLT 357 08/05/2022 1720   MCV 87.9 08/05/2022 1720   MCH 28.4 08/05/2022 1720   MCHC 32.3 08/05/2022 1720   RDW 12.9 08/05/2022 1720   LYMPHSABS 0.9 03/26/2022 0215   MONOABS 0.8 03/26/2022 0215   EOSABS 0.4 03/26/2022 0215   BASOSABS 0.1 03/26/2022 0215     CMP     Component Value Date/Time   NA 132 (L) 08/05/2022 1720   NA 135 09/15/2021 1147   K 4.1 08/05/2022 1720   CL 96 (L) 08/05/2022 1720   CO2 21 (L) 08/05/2022 1720   GLUCOSE 74 08/05/2022 1720   BUN 25 (H) 08/05/2022 1720   BUN 41 (H) 09/15/2021 1147   CREATININE 4.53 (H) 08/05/2022 1720   CREATININE 9.17 (H) 08/26/2021 1014   CALCIUM 9.0 08/05/2022 1720   PROT 7.5 08/05/2022 1720   PROT 7.5 09/15/2021 1147   ALBUMIN 2.9 (L)  08/05/2022 1720   ALBUMIN 4.3 09/15/2021 1147   AST 26 08/05/2022 1720   ALT 23 08/05/2022 1720   ALKPHOS 137 (H) 08/05/2022 1720   BILITOT 0.5 08/05/2022 1720   BILITOT 1.1 09/15/2021 1147   GFRNONAA 16 (L) 08/05/2022 1720   GFRAA >60 03/27/2020 1055     Imaging:  DG Chest 2 View Result Date: 08/05/2022 CLINICAL DATA:  Shortness of breath EXAM: CHEST - 2 VIEW COMPARISON:  Chest x-ray 07/08/2022.  Chest CT 06/30/2022. FINDINGS: Heart is enlarged, unchanged. There central pulmonary vascular congestion. Right-sided central venous catheter tip projects over the distal SVC. There is no lung consolidation, pleural effusion or pneumothorax. No acute fractures are seen. IMPRESSION: Cardiomegaly and central pulmonary vascular congestion.    ECG: My personal interpretation is sinus tachycardia, which is unchanged from prior ECG on 06-17-2022    ASSESSMENT & PLAN:   Assessment & Plan by Problem: Active Problems:   OSA (obstructive sleep apnea)   Hidradenitis suppurativa   Paroxysmal atrial flutter ([name])   ESRD (end stage renal disease) on dialysis ([place])   Acute on chronic respiratory failure with  hypoxia ([name])   Acute exacerbation of CHF (congestive heart failure) [facility])   Phillip Edgerly is a 37 year old male with a past medical history of morbid obesity, HFrEF, AFib-AFlutter, ESRD, OSA, OHS, and IAD who presents with breath shortness and leg swelling.   ---Acute on chronic hypoxic respiratory failure ---Heart failure reduced ejection fraction ---End-stage renal disease ---Morbid obesity complicated by OHS and OSA  Patient was discharged from [facility] IMTS on 11-16 for acute on chronic respiratory and heart failure complicated by atrial fibrillation-flutter secondary to amiodarone toxicity. Since that time he has been hospitalized twice, 11-27 and 12-12, with a similar presentation for these same conditions. He was discharged home from [facility] on 1-10, but felt unable to care for himself at that time due to generalized debility and persistence of symptoms. Previously, he had been using BiPAP nightly plus daytime supplemental oxygen, 3L/min at rest and 5-8L/min during activity. ------------------ Upon arrival, patient was tachypneic with labored breathing and requiring 6L/min. Exam notable for marked lower extremity swelling with four-plus pitting edema and weeping wound over left lateral malleolus. Hypoxic respiratory failure is almost certainly secondary to heart failure exacerbation of multifactorial etiology. > Fluticasone-vilanterol 100-25ug q24 > Nightly BiPAP > Supplemental oxygen to maintain SpO2>92 > Physical-occupational therapy evaluation   ---Acute on chronic heart failure exacerbation ---Heart failure reduced ejection fraction ---Non-ischemic cardiomyopathy Patient has history of heart failure secondary to non-ischemic cardiomyopathy that is limited in treatment options by comorbid ESRD and amiodarone toxicity.. Exacerbations have led to two hospitalizations since discharge from [facility] IMTS on 11-16 when he was prescribed a six-week taper of prednisone. After missing some  hemodialysis sessions during the holidays, he was admitted to [facility] on 11-27. Throughout that hospitalization, diuresis removed a total of 24L and patient returned home but his symptoms returned and he was admitted again on 12-12. At that time he developed thrombocytopenia and was given at least four days of intravenous dexamethasone, which worsened his lower extremity swelling. On 1-10, the patient reports that he was discharged from [name] despite persistence of his breath shortness and leg swelling. ------------------ Upon arrival to the [facility] ED, physical exam demonstrated marked lower extremity swelling and four-plus pitting edema while chest radiograph revealed central pulmonary congestion. Most recent echocardiogram performed 12-13 demonstrated ejection fraction of 25-30%, slightly improved from prior study in 02-2022. Current weight is 193.4kg and was  186.2kg at prior [facility] discharge on 11-16. Patient reports taking torsemide daily at home, which increases his urine output by about 1.2L per day. This presentation is consistent with acute on chronic heart failure exacerbation initially precipitated by overload secondary to missed hemodialysis sessions and now persisting from corticosteroid-induced fluid retention. It remains unclear why patient remains decompensated despite near-continuous hospitalizations in recent months, only identifiable change is the introduction of corticosteroids. Given complexity of this case, including low EF and BP plus tachycardia, primary team will request assistance from cardiology team for pharmacological diuresis. > Cardiology consult, appreciate recommendations > Trend BMP q24 > Monitor ins-outs and daily weights   ---End-stage renal disease Patient has history of end-stage renal disease and receives hemodialysis on a Tu-Th-Sa schedule. He was hospitalized in 05-2022 after missing some sessions during the holidays, which led to heart failure exacerbation and further  fluid retention. Most recent session was earlier today 1-11 when 5L fluid was removed.  ------------------ Upon arrival, laboratory testing consistent with baseline electrolytes and and improved BUN level. Given these findings, primary team has low concern for urgent hemodialysis but will consult nephrology with tentative plan of dialyzing tomorrow 1-12. > Nephrology consult, appreciate recommendations > Lanthanum 2000mg  q8-meals > Trend BMP q24   ---Paroxysmal atrial fibrillation-flutter Patient has history of atrial fibrillation-flutter but treatment options have been significantly limited due to comorbid HF, ESRD, and amiodarone toxicity. During his 10-23 [facility] hospitalization, he was cardioverted four times and then [place] attempted another cardioversion on 12-4 followed by ablation performed 12-6. Recurrence of symptoms led to subsequent hospitalization on 12-12 requiring ICU-level care and patient was given dronedarone, which converted him to normal sinus rhythm.  ------------------ Upon arrival to the [facility] ED, electrocardiogram demonstrated sinus tachycardia without AFib or AFlutter. Home medications include apixaban.    > Apixaban 5mg  q12 > Cardiac telemetry   ---Left lateral malleolus wound secondary to chronic edema ---Bilateral lower extremity pain Patient has developed an open 3-5cm lesion on left lateral malleolus that exhibits serous drainage. ------------------ Upon arrival, physical examination revealed significant tenderness to light palpation across both lower extremities. Etiology almost certainly skin breakdown and poor healing secondary to chronic edema in the setting of volume overload.  > Wound consult, appreciate recommendations > Acetaminophen 650mg  q6 PRN > Hydromorphone 1mg  q6 PRN > Trend CBC q24   ---Iron deficiency anemia Patient has history of iron deficiency anemia, confirmed via testing performed in 03-2022. He was prescribed ferrous sulfate during prior  [facility] hospitalization.  ------------------ Upon arrival on 1-11, laboratory testing notable for Hgb 12.7 and MCV 87.9. > Ferrous sulfate 325mg  q24   ---Hidradenitis suppurativa Patient has history of hidradenitis suppurativa secondary to morbid obesity. He has been and regularly takes doxycycline. > Doxycycline 100mg  q12   ---Insomnia Patient reports history of insomnia and takes melatonin nightly. > Melatonin 5mg  q24     Diet: Heart Healthy VTE: NOAC IVF: None,None Code: Full  Prior to Admission Living Arrangement: Home, living with uncle Anticipated Discharge Location:  TBD Barriers to Discharge: oxygen requirement, disposition planning  Dispo: Admit patient to Inpatient with expected length of stay greater than 2 midnights.  Signed: Serita Butcher, MD Internal Medicine Resident PGY-1  08/06/2022, 12:11 AM

## 2022-08-05 NOTE — Progress Notes (Signed)
Patient presented in apparent distress to [employer] via w/c on 1 L O2 via [place]. States he is rationing oxygen so tank does not run out. Usually on 3-6 L O2 at home. Brought immediately to Rm 4 and switched to our tank.   SpO2 97% on 3 L HR 113  Provider notified and in to examine patient.  F/u vitals documented in Epic.  Hubbard Hartshorn, BSN, RN-BC

## 2022-08-05 NOTE — Hospital Course (Addendum)
Acute on chronic heart failure exacerbation Heart failure reduced ejection fraction Non-ischemic cardiomyopathy Patient has history of heart failure secondary to non-ischemic cardiomyopathy that is limited in treatment options by comorbid ESRD and amiodarone toxicity. Exacerbations have led to two hospitalizations since discharge from [facility] on 11-16 when he was prescribed a six-week taper of prednisone. After missing some hemodialysis sessions during the holidays, he was admitted to [facility] on 11-27. Throughout that hospitalization, diuresis removed a total of 24L and patient returned home but his symptoms returned and he was admitted again on 12-12. At that time he developed thrombocytopenia and was given at least four days of intravenous dexamethasone, which worsened his lower extremity swelling. On 1-10, the patient reports that he was discharged from [facility] despite persistence of his breath shortness and leg swelling. Upon arrival to the [facility] ED, physical exam demonstrated marked lower extremity swelling and four-plus pitting edema while chest radiograph revealed central pulmonary congestion. Most recent echocardiogram performed 12-13 demonstrated ejection fraction of 25-30%, slightly improved from prior study in 02-2022. Admission weight was 193.4 kg, up from 186.2kg at prior [facility] discharge on 11-16. Patient reports taking torsemide daily at home, which increases his urine output by about 1.2L per day. Presentation was consistent with acute on chronic heart failure exacerbation initially precipitated by overload secondary to missed hemodialysis sessions and now persisting from corticosteroid-induced fluid retention. It remains unclear why patient remains decompensated despite near-continuous hospitalizations in recent months, only identifiable change is the introduction of corticosteroids. Given complexity of this case, including low EF and BP plus tachycardia, AHF was consulted. GDMT limited by CKD and  hypotension requiring midodrine. He is not a good candidate for ICD placement given obesity and dialysis. Volume control achieved with serial dialysis, beginning 1/13.   End-stage renal disease Patient has history of end-stage renal disease and receives hemodialysis on a Tu-Th-Sa schedule. He was hospitalized in 05-2022 after missing some sessions during the holidays, which led to heart failure exacerbation and further fluid retention. Most recent session was earlier today 1-11 when 5L fluid was removed. Upon arrival, laboratory testing consistent with baseline electrolytes and improved BUN level. Given these findings, primary team had low concern for urgent hemodialysis but consulted nephrology who recommended HD mon-sat with UF.     01/14: complaining of pain in feet around bones, wounds. Hurts to touch. Says walking around is not good--uses a walker, walks like a old man. Can't keep feet down. Says his breathing isn't good. Feels like he is getting volume off with dialysis. "Legs aren't a volume issue it's a medicine issue." Thinks eliquis is causing hemoglobin to drop. Says when he has superficial scratch wounds it bleeds a lot and takes a long time to stop. "Don't nobody know my body like me."    -After admission Oct, go to [place] for Thanksgiving. Start to feel worse, HA, feet pain, high BP. Went to [employer] Got release. 1 week later, felt lightheaded. HR 180s. Did not want to be on Dronededrone bc scared of side effect.   -? HD sessions, missed?, shortens -? Taking torsemide or Meto - ? Using Bipap - ? Worsening LE edema or orthopnea - ? Home O2 -  Wt 426 196 -dexa 3-4 days -LE edema improves. Feet swollen -More dyspnea w exertion, can't walk -1-3 at rest 5-8 w activity  -Live w uncle in [place]  Family History  Problem Relation Age of Onset   Heart disease Mother    Hypertension Mother  Pulmonary Hypertension Mother    Drug abuse Father        died due to Heroin  overdose     Says can't breath laying down. No wind if he is moving and walking around, long standing. Got pain meds and able to sit up a bit, helps him breath. Says [facility] changed meds, put him on eliquis, causing a lot of bleeding. Home for 2 weeks since being here in November. [facility]. Had a hard time getting to his car at discharge from [facility]. Only able to take a few steps at a time, gets dizzy, has to abruptly sit down. If he keeps pushing will fall out, last happened 2022 after dialysis. N change in symptoms since last admission at [facility]. He thinks prednisone is causing all of the fluid gain.Legs never looked like they do now. Says to stop chronic cough needs continuous cough drops, lost all teeth except 2 top teeth from that. Prednisone last dose 4 days ago. Never had weaping from the legs before. Says it is an allergic reaction. White patches appeared on legs with all of the swelling says it looked like cellulitis. Describes severe pain to touch of the LE.   1/17 Feeling a little congested  Still having significant edema but improving with UF Turned o2 down to 3 L Still tachy at 104 O2 95-100 on 3L    1/19 Diddnt wear cpap because waking PND anyway O2sats 95 Tachy 102

## 2022-08-05 NOTE — H&P (Incomplete)
Date: 08/05/2022               Patient Name:  Phillip Young MRN: [id number]  DOB: April 06, 1986 Age / Sex: 37 y.o., male   PCP: [name], MD         Medical Service: Internal Medicine Teaching Service         Attending Physician: Dr. Wyvonnia Dusky, MD    First Contact: Drucie Opitz, MD      Pager: Bethann Goo [phone]      Second Contact: Christiana Fuchs, DO      Pager: [place] Dec [phone]           After Hours (After 5p/  First Contact Pager: [phone]  weekends / holidays): Second Contact Pager: [phone]   SUBJECTIVE   Chief Complaint: breath shortness and leg swelling   History of Present Illness:  Phillip Young is a 37 year old male with a past medical history of morbid obesity, HFrEF, AFib-AFlutter, ESRD, OSA, OHS, and IAD who presents with breath shortness and leg swelling.  Patient was hospitalized at [facility] in 05-2022 for amiodarone toxicity, heart failure, and atrial fibrillation. After discharge, he visited [place] for Thanksgiving and started to feel worse. At that time, he prematurely terminated some of his hemodialysis sessions. He specifically reported headache, feet pain, and high blood pressure that prompted him to seek medical attention at [facility]. About one week later, he was discharged in stable condition but then developed similar symptoms requiring another hospitalization on 12-12. While in the hospital his heart rate increased to the 180s, which required ICU-level care. He was given dronedarone, but refused to take it out of concern for toxic side effects.  He reports full adherence to his currently prescribed medications including apixaban, gabapentin, oxycodone, doxycycline, and torsemide. Additionally, he completes hemodialysis thrice weekly.   ED Course: Upon arrival to the ED visit were notable for HR 112, BP 119/104, RR 24, and SpO2 99% on 6L/min. Laboratory testing demonstrated Na 132, Cl 96, Bicarb 21, BUN 25, Cr 4.53, Alb 2.9, AlkPhos  137, GFR 16, and LA 2.4. Chest radiograph revealed cardiomegaly and central pulmonary vascular congestion. Electrocardiogram showed sinus tachycardia without AFib or AFlutter.   Meds:  No outpatient medications have been marked as taking for the 08/05/22 encounter [facility] Encounter).    Past Medical History  Past Surgical History:  Procedure Laterality Date  . ABSCESS DRAINAGE    . AV FISTULA PLACEMENT Left 08/21/2021   Procedure: LEFT ARM ARTERIOVENOUS (AV) FISTULA.;  Surgeon: Serafina Mitchell, MD;  Location: [place];  Service: Vascular;  Laterality: Left;  . CARDIOVERSION N/A 10/09/2021   Procedure: CARDIOVERSION;  Surgeon: Larey Dresser, MD;  Location: [facility] ENDOSCOPY;  Service: Cardiovascular;  Laterality: N/A;  . CARDIOVERSION N/A 05/28/2022   Procedure: CARDIOVERSION;  Surgeon: Larey Dresser, MD;  Location: [facility] ENDOSCOPY;  Service: Cardiovascular;  Laterality: N/A;  . CARDIOVERSION N/A 06/07/2022   Procedure: CARDIOVERSION;  Surgeon: Larey Dresser, MD;  Location: [facility] ENDOSCOPY;  Service: Cardiovascular;  Laterality: N/A;  . IR FLUORO GUIDE CV LINE RIGHT  03/10/2021  . IR FLUORO GUIDE CV LINE RIGHT  04/22/2021  . IR FLUORO GUIDE CV LINE RIGHT  08/20/2021  . IR US GUIDE VASC ACCESS RIGHT  03/10/2021  . IR US GUIDE VASC ACCESS RIGHT  04/22/2021  . RIGHT HEART CATH N/A 03/06/2021   Procedure: RIGHT HEART CATH;  Surgeon: Jolaine Artist, MD;  Location: [place] CV LAB;  Service: Cardiovascular;  Laterality: N/A;  .  RIGHT/LEFT HEART CATH AND CORONARY ANGIOGRAPHY N/A 03/04/2020   Procedure: RIGHT/LEFT HEART CATH AND CORONARY ANGIOGRAPHY;  Surgeon: Larey Dresser, MD;  Location: [place] CV LAB;  Service: Cardiovascular;  Laterality: N/A;  . TEE WITHOUT CARDIOVERSION N/A 05/05/2021   Procedure: TRANSESOPHAGEAL ECHOCARDIOGRAM (TEE);  Surgeon: Larey Dresser, MD;  Location[facility] ENDOSCOPY;  Service: Cardiovascular;  Laterality: N/A;  . TEMPORARY DIALYSIS CATHETER  03/06/2021    Procedure: TEMPORARY DIALYSIS CATHETER;  Surgeon: Jolaine Artist, MD;  Location: [place] CV LAB;  Service: Cardiovascular;;     Social:  Lives With: uncle in [place] Occupation: formerly bus driver Support: family Level of Function: formerly independent PCP: Multimedia programmer DO Substances: none    Family History:   Family History  Problem Relation Age of Onset  . Heart disease Mother   . Hypertension Mother   . Pulmonary Hypertension Mother   . Drug abuse Father        died due to Heroin overdose  Myocardial Infarction age 80yr  Father    Allergies: Allergies as of 08/05/2022 - Review Complete 08/05/2022  Allergen Reaction Noted  . Coreg [carvedilol] Shortness Of Breath and Diarrhea 07/07/2020  . Heparin Other (See Comments) 03/05/2021  . Amiodarone Other (See Comments) 06/01/2022      Review of Systems: A complete ROS was negative except as per HPI.    OBJECTIVE:   Physical Exam: Blood pressure (!) 119/104, pulse (!) 105, temperature 99.9 F (37.7 C), temperature source Oral, resp. rate 20, SpO2 99 %.   General:      awake and alert, lying uncomfortably in bed, cooperative, mild distress Skin:       warm and dry, open 3-5cm lesion on left lateral malleolus with serous drainage, significant tenderness to light touch across  Lungs:      increased respiratory effort, breathing significantly labored, very mild diffuse crackles, no wheezing Cardiac:      sinus tachycardia, normal S1 and S2, capillary refill 2-3 seconds, 4+ pitting edema, markedly swollen lower extremities with weeping wound on left foot Abdomen:      soft and non-distended, no tenderness to palpation or guarding Neurologic:      oriented to person-place-time, moving all extremities, no gross focal deficits Psychiatric:      euthymic mood with congruent affect, intelligible speech     Labs: CBC    Component Value Date/Time   WBC 8.8 08/05/2022 1720   RBC 4.47 08/05/2022  1720   HGB 12.7 (L) 08/05/2022 1720   HCT 39.3 08/05/2022 1720   PLT 357 08/05/2022 1720   MCV 87.9 08/05/2022 1720   MCH 28.4 08/05/2022 1720   MCHC 32.3 08/05/2022 1720   RDW 12.9 08/05/2022 1720   LYMPHSABS 0.9 03/26/2022 0215   MONOABS 0.8 03/26/2022 0215   EOSABS 0.4 03/26/2022 0215   BASOSABS 0.1 03/26/2022 0215     CMP     Component Value Date/Time   NA 132 (L) 08/05/2022 1720   NA 135 09/15/2021 1147   K 4.1 08/05/2022 1720   CL 96 (L) 08/05/2022 1720   CO2 21 (L) 08/05/2022 1720   GLUCOSE 74 08/05/2022 1720   BUN 25 (H) 08/05/2022 1720   BUN 41 (H) 09/15/2021 1147   CREATININE 4.53 (H) 08/05/2022 1720   CREATININE 9.17 (H) 08/26/2021 1014   CALCIUM 9.0 08/05/2022 1720   PROT 7.5 08/05/2022 1720   PROT 7.5 09/15/2021 1147   ALBUMIN 2.9 (L) 08/05/2022 1720   ALBUMIN 4.3  09/15/2021 1147   AST 26 08/05/2022 1720   ALT 23 08/05/2022 1720   ALKPHOS 137 (H) 08/05/2022 1720   BILITOT 0.5 08/05/2022 1720   BILITOT 1.1 09/15/2021 1147   GFRNONAA 16 (L) 08/05/2022 1720   GFRAA >60 03/27/2020 1055     Imaging:  DG Chest 2 View Result Date: 08/05/2022 CLINICAL DATA:  Shortness of breath EXAM: CHEST - 2 VIEW COMPARISON:  Chest x-ray 07/08/2022.  Chest CT 06/30/2022. FINDINGS: Heart is enlarged, unchanged. There central pulmonary vascular congestion. Right-sided central venous catheter tip projects over the distal SVC. There is no lung consolidation, pleural effusion or pneumothorax. No acute fractures are seen. IMPRESSION: Cardiomegaly and central pulmonary vascular congestion.    ECG: My personal interpretation is sinus tachycardia, which is unchanged from prior ECG on 06-17-2022    ASSESSMENT & PLAN:   Assessment & Plan by Problem: Active Problems:   * No active hospital problems. *   Phillip Lemen is a 37 year old male with a past medical history of morbid obesity, HFrEF, AFib-AFlutter, ESRD, OSA, OHS, and IAD who presents with breath shortness and leg  swelling.   ---Acute on chronic hypoxic respiratory failure ---Morbid obesity complicated by hypoventilation syndrome  Patient was discharged from [facility] on 11-16 for acute on chronic respiratory and heart failure complicated by atrial fibrillation-flutter secondary to amiodarone toxicity. Since that time he has been hospitalized twice, 11-27 and 12-12, with a similar presentation for these same conditions. He was discharged home from [facility] on 1-10, but felt unable to care for himself at that time due to generalized debility and persistence of symptoms. Previously, he had been using BiPAP nightly plus daytime supplemental oxygen, 3L/min at rest and 5-8L/min during activity. Upon arrival, patient was tachypneic with labored breathing and requiring 6L/min. Exam notable for marked lower extremity swelling with four-plus pitting edema and weeping wound over left lateral malleolus. Hypoxic respiratory failure is almost certainly secondary to heart failure exacerbation of multifactorial etiology. Fluticasone-vilanterol 100-25ug q24 Menthol 5.8mg  q4 PRN > *** > *** > *** > *** > *** > ***   ---Acute heart failure reduced ejection fraction exacerbation Patient has history of heart failure requiring two hospitalizations since discharge from [facility] on 11-16. He was discharged with a six-week taper of prednisone. After missing some hemodialysis sessions during the holidays, he was admitted to [facility] on 11-27. Throughout that hospitalization, diuresis removed a total of 24L and patient returned home but his symptoms returned and he was admitted again on 12-12. At that time he developed thrombocytopenia and was given at least four days of intravenous dexamethasone, which worsened his lower extremity swelling. On 1-10, the patient reports that he was discharged from [facility] despite persistence of his breath shortness and leg swelling. Upon arrival to the [facility] ED, physical exam demonstrated marked  lower extremity swelling and four-plus pitting edema while chest radiograph revealed central pulmonary congestion. These findings are consistent with acute on chronic heart failure exacerbation secondary to multiple factors including volume overload from missed hemodialysis session and corticosteroid-induced fluid retention. Most recent echocardiogram performed 12-13 demonstrated ejection fraction of 25-30%. Current weight is 193.4kg and was 186.2kg upon prior [facility] discharge on 11-16. Home medications include torsemide. BNP 15.5 Increase in cardiac output and cardiac size, leading to xxx Metoprolol 25mg  q24 Midodrine 5mg  q8 Torsemide 100mg  qTu-Th-Sa > *** > *** > *** > Trend BMP q24 > ***   ---End-stage renal disease Patient has history of end-stage renal disease and receives hemodialysis  on a Tu-Th-Sa schedule. He was hospitalized in 05-2022 after missing some sessions during the holidays, which led to heart failure exacerbation and further fluid retention. Most recent session was earlier today 1-11 when 5L fluid was removed. Upon arrival, laboratory testing consistent with baseline electrolytes and and improved BUN level. Given these findings, primary team has low concern for urgent hemodialysis.  Lanthanum 1000mg  q8 > *** > *** > Trend BMP q24 > ***   ---Atrial fibrillation-flutter Patient has history of atrial fibrillation-flutter but treatment options have been significantly limited due to comorbid HF, ESRD, and amiodarone toxicity. During his 10-23 [facility] hospitalization, he was cardioverted four times and then [place] attempted another cardioversion on 12-4 followed by ablation performed 12-6. Recurrence of symptoms led to subsequent hospitalization on 12-12 requiring ICU-level care and patient was given dronedarone, which converted him to normal sinus rhythm. Upon arrival to the [facility] ED, electrocardiogram demonstrated sinus tachycardia without AFib or AFlutter. Home medications include  apixaban.    Apixaban 5mg  q12 > *** > *** > *** > ***  0-23 admission Cardioverted four times - 10-28, 11-3, 11-13 Prednisone taper for amiodarone toxicity  11-27 admission [facility] HD on torsemide November for missing dialysis due to holidays Cardioverted on 12-4, Ablation on 12-6, apixaban, metoprolol, -24L removed  12-12 admission [facility] Sent to ICU for HR in 170s, put on CRRT but unable to tolerate, put on dronedarone which converted him to NSR, also started on dexamethasone for four days, dry weight about 180kg   ---Left lateral malleolus wound secondary to chronic edema ---Bilateral lower extremity pain Patient has developed an open 3-5cm lesion on left lateral malleolus that exhibits serous drainage. Physical examination revealed significant tenderness to light palpation across both lower extremities. Etiology almost certainly skin breakdown and poor healing secondary to chronic edema in the setting of volume overload.  > Wound consult, appreciate recommendations > Trend CBC q24   ---Iron deficiency anemia Patient has history of iron deficiency anemia, confirmed via testing performed in 03-2022. He was prescribed ferrous sulfate during prior [facility] hospitalization. Upon arrival on 1-11, laboratory testing notable for Hgb 12.7 and MCV 87.9. Ferrous sulfate 325mg  q24 > *** > ***   ---Hidradenitis suppurativa Patient has history of hidradenitis suppurativa secondary to morbid obesity. He takes doxycycline at home. > ***   ---Insomnia Patient reports history of insomnia and takes melatonin nightly. Melatonin 5mg  q24 > ***     Diet: {NAMES:3044014::"Normal","Heart Healthy","Carb-Modified","Renal","Carb/Renal","NPO","TPN","Tube Feeds"} VTE: {NAMES:3044014::"Heparin","Enoxaparin","SCDs","NOAC","None"} IVF: {NAMES:3044014::"None","NS","1/2 NS","LR","D5","D10"},{NAMES:3044014::"None","10cc/hr","25cc/hr","50cc/hr","75cc/hr","100cc/hr","110cc/hr","125cc/hr","Bolus"} Code:  {NAMES:3044014::"Full","DNR","DNI","DNR/DNI","Comfort Care","Unknown"}  Prior to Admission Living Arrangement: {NAMES:3044014::"Home, living ***","SNF, ***","Homeless","***"} Anticipated Discharge Location: {NAMES:3044014::"Home","SNF","CIR","***"} Barriers to Discharge: ***  Dispo: Admit patient to {STATUS:3044014::"Observation with expected length of stay less than 2 midnights.","Inpatient with expected length of stay greater than 2 midnights."}  Signed: Serita Butcher, MD Internal Medicine Resident PGY-1  08/05/2022, 7:02 PM

## 2022-08-05 NOTE — Progress Notes (Signed)
CC: acute on chronic respiratory failure  HPI:  [name] is a 37 y.o. person with past medical history as detailed below who presents to [facility] in acute respiraory distress. Please see problem based charting for detailed assessment and plan.  Past Medical History:  Diagnosis Date   Acute on chronic respiratory failure with hypoxia ([place]) 04/21/2021   Acute on chronic systolic (congestive) heart failure ([name]) 02/26/2020   Biventricular congestive heart failure ([place])    Last Echo 11/2019 at [facility] reveals EF 20%   Class 3 severe obesity due to excess calories with serious comorbidity and body mass index (BMI) of 50.0 to 59.9 in adult [facility]) 02/26/2020   Essential hypertension 02/26/2020   GERD without esophagitis 02/26/2020   Hidradenitis suppurativa 02/26/2020   OSA (obstructive sleep apnea) 02/26/2020   Prediabetes 02/26/2020   Renal disorder    Review of Systems:  Negative unless otherwise stated.  Physical Exam:  Vitals:   08/05/22 1520 08/05/22 1525 08/05/22 1546  BP:   104/64  Pulse: (!) 113 (!) 109   Temp:   99.1 F (37.3 C)  TempSrc:   Oral  SpO2: 97% 100%   Weight:  (!) 426 lb 6.4 oz (193.4 kg)    Constitutional:Morbidly obese gentleman in acute respiratory distress. Cardio:Tachycardia. Pulm:Clear to auscultation bilaterally. Increased work of breating on 3L [place]. Patient unable to complete full sentences and is panting when he attempts to carry on conversation.  MSK/Skin:He has 3-4+ pitting edema to bilateral distal LE with weeping and increased erythema. The edematous areas are warm. There is skin breakdown over the lateral malleolus of the L ankle. Neuro:Alert and oriented x3. No focal deficit noted. Psych:Pleasant but does seem scared.  Assessment & Plan:   See Encounters Tab for problem based charting.  Decompensated heart failure [facility]) Patient presented to the front desk of the [facility] in distress on 1L [place]. He stated that he had been rationing his supply  of home oxygen due to low supply. He is normally on 3-6L O2 at home. Initial vital signs were HR 113, SpO2 97% on 3L [place]. Repeat vitals several minutes later were HR 109, SpO2 100% on 3L [place], BP 104/64.  Patient states that he was discharged from [facility] yesterday 01/10 though he did not feel ready to be discharged. He was still having difficulty breathing, could not ambulate, and bilateral LE edema was continuing to worsen with both increased size and pain. Because he was still feeling poorly he presented to [facility] where he was deemed medically stable for discharge, and advised to return to [facility] ED for continued work-up and treatment.   He explains that during his recent hospital stay he had problems with compression wraps over the distal lower extremities and ultimately required ICU transfer due to tachycardia in the 160s-170s. He says that when the problems with the compression stockings were addressed, his tachycardia resolved. He says that he has been doing well since he underwent ablation for atrial flutter.   He does not feel that he is retaining fluid in his abdomen nor is he confident that he is retaining fluid in his lungs. He does endorse progression of his dyspnea, however.  On my exam he is in acute respiratory distress on 3L [place]. He is hypotensive and tachycardic. He has appropriate oxygen saturations however he is unable to complete full sentences and is panting, having difficulty with his breathing. He has 3-4+ pitting edema to bilateral distal LE with weeping and increased erythema. The  edematous areas are warm. There is skin breakdown over the lateral malleolus of the L ankle. His weight today is 426.4 lbs, around 3 lbs increased from his weight at [place] yesterday evening (must consider differences in scales, but regardless, weight is up). Compared to his last hospitalization at [facility], his weight is increased almost 15 lbs (410.5 lbs 11/16).   He says that he was taken off of midodrine 5  mg TID while admitted at [facility] and that he has not been taking metoprolol as he is "allergic." He does say that he has been taking his torsemide 100 mg daily.  At [facility] an ABG was normal, there was no lactic acidosis. His hemoglobin was low at 7.1 and his renal function was stable compared to prior lab draws. He did undergo hemodialysis today and says they pulled off 5.2 L during that session. I do not have a source of his worsened anemia at this time.   EKG shows sinus tachycardia with biatrial enlargement and new T  wave inversions in V5 and V6.  Assessment: His acute on chronic hypoxia in the setting of the EKG findings above and lack of documented CAD are concerning in this high-risk patient for possible NSTEMI versus demand ischemia. I feel that he needs direct hospital admission, however there are no progressive beds available at this time.  Plan:Our staff will take him from our clinic to the ED for initiation of further work-up and hospital admission for decompensated heart failure with further complicating signs/symptoms of acute respiratory failure, hypotension, and sinus tachycardia. I anticipate he will need IV diuresis, consult to advanced heart failure team, consult to cardiology given EKG findings, STAT labs including CBC, CMP, troponins. Hopefully it is not the case, but he may require pressor support while being diuresed given his hypotension.  Patient discussed with Dr. Dareen Piano

## 2022-08-05 NOTE — Assessment & Plan Note (Addendum)
Patient presented to the front desk of the [facility] in distress on 1L [place]. He stated that he had been rationing his supply of home oxygen due to low supply. He is normally on 3-6L O2 at home. Initial vital signs were HR 113, SpO2 97% on 3L [place]. Repeat vitals several minutes later were HR 109, SpO2 100% on 3L [place], BP 104/64.  Patient states that he was discharged from [facility] yesterday 01/10 though he did not feel ready to be discharged. He was still having difficulty breathing, could not ambulate, and bilateral LE edema was continuing to worsen with both increased size and pain. Because he was still feeling poorly he presented to [facility] where he was deemed medically stable for discharge, and advised to return to [facility] ED for continued work-up and treatment.   He explains that during his recent hospital stay he had problems with compression wraps over the distal lower extremities and ultimately required ICU transfer due to tachycardia in the 160s-170s. He says that when the problems with the compression stockings were addressed, his tachycardia resolved. He says that he has been doing well since he underwent ablation for atrial flutter.   He does not feel that he is retaining fluid in his abdomen nor is he confident that he is retaining fluid in his lungs. He does endorse progression of his dyspnea, however.  On my exam he is in acute respiratory distress on 3L . He is hypotensive and tachycardic. He has appropriate oxygen saturations however he is unable to complete full sentences and is panting, having difficulty with his breathing. He has 3-4+ pitting edema to bilateral distal LE with weeping and increased erythema. The edematous areas are warm. There is skin breakdown over the lateral malleolus of the L ankle. His weight today is 426.4 lbs, around 3 lbs increased from his weight at [facility] yesterday evening (must consider differences in scales, but regardless, weight is up). Compared to his last  hospitalization at [facility], his weight is increased almost 15 lbs (410.5 lbs 11/16).   He says that he was taken off of midodrine 5 mg TID while admitted at [facility] and that he has not been taking metoprolol as he is "allergic." He does say that he has been taking his torsemide 100 mg daily.  At [facility] an ABG was normal, there was no lactic acidosis. His hemoglobin was low at 7.1 and his renal function was stable compared to prior lab draws. He did undergo hemodialysis today and says they pulled off 5.2 L during that session. I do not have a source of his worsened anemia at this time.   EKG shows sinus tachycardia with biatrial enlargement and new T  wave inversions in V5 and V6.  Assessment: His acute on chronic hypoxia in the setting of the EKG findings above and lack of documented CAD are concerning in this high-risk patient for possible NSTEMI versus demand ischemia. I feel that he needs direct hospital admission, however there are no progressive beds available at this time.  Plan:Our staff will take him from our clinic to the ED for initiation of further work-up and hospital admission for decompensated heart failure with further complicating signs/symptoms of acute respiratory failure, hypotension, and sinus tachycardia. I anticipate he will need IV diuresis, consult to advanced heart failure team, consult to cardiology given EKG findings, STAT labs including CBC, CMP, troponins. Hopefully it is not the case, but he may require pressor support while being diuresed given his hypotension.

## 2022-08-05 NOTE — Progress Notes (Deleted)
   CC: leg swelling  HPI:  [name] is a 37 y.o. with medical history of chronic hypoxic respiratory failure, ESRD on HD TThSat, HFrEF 20 % EF, GERD presenting to [facility] for leg swelling. Pt admitted for 07/22/22-06/10/22 for acute hypoxic respiratory failure.   Please see problem-based list for further details, assessments, and plans.  Past Medical History:  Diagnosis Date   Acute on chronic respiratory failure with hypoxia ([place]) 04/21/2021   Acute on chronic systolic (congestive) heart failure ([name]) 02/26/2020   Biventricular congestive heart failure ([place])    Last Echo 11/2019 at [facility] reveals EF 20%   Class 3 severe obesity due to excess calories with serious comorbidity and body mass index (BMI) of 50.0 to 59.9 in adult [facility]) 02/26/2020   Essential hypertension 02/26/2020   GERD without esophagitis 02/26/2020   Hidradenitis suppurativa 02/26/2020   OSA (obstructive sleep apnea) 02/26/2020   Prediabetes 02/26/2020   Renal disorder      Current Outpatient Medications (Cardiovascular):    metoprolol succinate (TOPROL-XL) 25 MG 24 hr tablet, TAKE 1 TABLET(25 MG) BY MOUTH TWICE DAILY   midodrine (PROAMATINE) 5 MG tablet, TAKE 1 TABLET(5 MG) BY MOUTH THREE TIMES DAILY WITH MEALS   torsemide (DEMADEX) 100 MG tablet, TAKE 1 TABLET BY MOUTH 3 TIMES A WEEK ON NON-DIALYSIS DAYS.  Current Outpatient Medications (Respiratory):    fluticasone furoate-vilanterol (BREO ELLIPTA) 100-25 MCG/ACT AEPB, Inhale 1 puff into the lungs daily.   Current Outpatient Medications (Hematological):    apixaban (ELIQUIS) 5 MG TABS tablet, Take 1 tablet (5 mg total) by mouth 2 (two) times daily.   ferrous sulfate 325 (65 FE) MG tablet, Take 1 tablet (325 mg total) by mouth daily.  Current Outpatient Medications (Other):    diclofenac Sodium (VOLTAREN) 1 % GEL, Apply 4 g topically 3 (three) times daily as needed (joint pains).   lanthanum (FOSRENOL) 1000 MG chewable tablet, Chew 2,000 mg by mouth 3  (three) times daily with meals.   melatonin 5 MG TABS, Take 1 tablet (5 mg total) by mouth at bedtime.   Menthol (HALLS COUGH DROPS) 5.8 MG LOZG, Use as directed 1 lozenge in the mouth or throat every 4 (four) hours as needed (cough).  Review of Systems:  Review of system negative unless stated in the problem list or HPI.    Physical Exam:  There were no vitals filed for this visit.  Physical Exam General: NAD HENT: NCAT Lungs: CTAB, no wheeze, rhonchi or rales.  Cardiovascular: Normal heart sounds, no r/m/g, 2+ pulses in all extremities. No LE edema Abdomen: No TTP, normal bowel sounds MSK: No asymmetry or muscle atrophy.  Skin: no lesions noted on exposed skin Neuro: Alert and oriented x4. CN grossly intact Psych: Normal mood and normal affect   Assessment & Plan:   No problem-specific Assessment & Plan notes found for this encounter.   See Encounters Tab for problem based charting.  Patient discussed with Dr. {NAMES:[id number]::"Guilloud","Hoffman","Mullen","Narendra","Vincent","Machen","Lau","Hatcher"} Idamae Schuller, MD Tillie Rung. [facility] Internal Medicine Residency, PGY-2

## 2022-08-05 NOTE — ED Triage Notes (Addendum)
Pt c/o SOB and LE [id number]. Pt states was started on steroids a month ago and that caused the edema of his feet. Pt wears 3L 02 at rest and 5L 02 w/activity at baseline. Pt is on 6L 02 per [place] right now. Pt has to catch his breath while talking. Pt is tachypneic. Pt is dialysis pt Tues, Thurs, Sat. Pt did go to dialysis today. Pt 4+ swelling of feet and legs bilat

## 2022-08-05 NOTE — ED Provider Notes (Signed)
This is a 37-year male with a history of multiple chronic medical comorbidities, including end-stage renal disease on Tuesday Thursday Saturday dialysis, significant advanced congestive heart failure, thrombocytopenia, A-fib and flutter not on anticoagulation, chronic pulmonary and peripheral edema, presenting to the Emergency Department with respiratory distress.  Patient was initially evaluated in the internal medicine residency clinic today and referred in for further evaluation due to labored breathing.  Per review of his external chart he had an extensive stay at [facility] last month, was discharged only 1 to 2 days ago.    He states that among his other medical problems he was told that he has amiodarone toxicity when he was in the hospital, which confirmed on records, and he was treated with a prednisone taper.  He does not feel that his breathing was any better at the time of discharge.  He is also upset that he was weaned off of narcotic pain medicine, and says he has excruciating pain due to the edema in his legs.  It is not clear why the patient was not discharged to rehab facility or SNF.  He states this was never discussed with him.  From [facility] discharge summary:  "BACKGROUND (from initial consultation note): Phillip Young is a 37 y.o. male with history of ESRD on HD on a TTS schedule, HTN, Type 2 diabetes mellitus, OSA, history of HIT, HFrEF (EF 20%), paroxysmal atrial fibrillation/flutter s/p DCCV on 06/29/19-23 and ablation 12/6 on Eliquis, who presents for shortness of breath and palpitations. He was just discharged from the CVICU after admission from 11/27 to 12/7 for acute hypoxic respiratory failure in the setting of missed dialysis and subsequent development of atrial flutter with RVR and acute HFrEF exacerbation. He underwent both CRRT and HD and was net -24 L for his stay. Patient was also discharged on a prednisone taper per pulmonology for suspected amiodarone  toxicity.  In the ED patient was afebrile, blood pressure 110-130/80-90 mm Hg and noted to be in atrial fibrillation with RVR with rates in the 140s to 160s. He was floridly overloaded and started on BiPAP."  Echo in Dec 2023 with EF 25-30%  *  On exam today the patient has labored breathing.  He is speaking in short sentences.  He is morbidly obese.  Difficult to auscultate breath sounds.  I do not hear audible wheezing however.  He is on 6 L nasal cannula, telling me that he uses 3 to 6 L depending on his movement and activity level.  He is tachycardic, temp of 99.9.  Labs show BNP which may be falsely low given his known congestive heart failure.  Remaining labs including troponin appear to be close to baseline level with no emergent findings.  Chest x-ray per my review shows cardiomegaly with vascular congestion.  *  It is extremely difficult to determine how much of this is acute or chronic given his medical comorbidities.  The patient is obviously experiencing a significant amount of discomfort from his peripheral edema.  He also was quite labored breathing.  I will consult again with the internal medicine service to help evaluate the patient.  I suspect with his complex medical conditions, including extremely limited mobility, the patient would likely benefit from SNF placement.  *  His labs today did not show an emergent indication for repeat dialysis.  He did complete dialysis earlier.  I discussed the case with the internal medicine service and they will come admit the patient   Octaviano Glow  J, MD 08/05/22 2010

## 2022-08-05 NOTE — ED Notes (Signed)
Sandwich and water provided to  the pt.  

## 2022-08-05 NOTE — Telephone Encounter (Signed)
Spoke with Neoma Laming his case worker from [employer]. She needed clarification on some dates, so she could get his disability claim approved.

## 2022-08-05 NOTE — ED Provider Triage Note (Signed)
Emergency Medicine Provider Triage Evaluation Note  Phillip Young , a 37 y.o. male  was evaluated in triage.  Pt complains of shortness of breath and lower extremity edema.  Has history of end-stage renal disease requiring dialysis 3 times per week and significant cardiac disease.  Was recently admitted at [facility] and discharged yesterday after ablation.  States he was still tachycardic to 120 bpm at discharge.  Was seen by nephrology and cardiology and encouraged to follow-up with podiatry.  States he feels like he was not medically stable to be discharged.  Is on 3 L nasal cannula at rest and 5 L nasal cannula while ambulating at baseline.  This has been bumped up to 6 L nasal cannula at rest.  Review of Systems  Positive: As above Negative: As above  Physical Exam  BP (!) 119/104 (BP Location: Right Arm)   Pulse (!) 112   Temp 99.9 F (37.7 C) (Oral)   Resp (!) 24   SpO2 99%  Gen:   Awake, no distress   Resp:  Tachypnea with increased effort MSK:   Moves extremities without difficulty  Other:  Morbid obesity.  Significant lower extremity edema  Medical Decision Making  Medically screening exam initiated at 5:32 PM.  Appropriate orders placed.  Phillip Young was informed that the remainder of the evaluation will be completed by another provider, this initial triage assessment does not replace that evaluation, and the importance of remaining in the ED until their evaluation is complete.  Patient will be brought back to a room straight from triage   Phillip Young, Phillip Young 08/05/22 1734

## 2022-08-06 DIAGNOSIS — I5030 Unspecified diastolic (congestive) heart failure: Secondary | ICD-10-CM

## 2022-08-06 DIAGNOSIS — I5043 Acute on chronic combined systolic (congestive) and diastolic (congestive) heart failure: Secondary | ICD-10-CM | POA: Insufficient documentation

## 2022-08-06 DIAGNOSIS — I429 Cardiomyopathy, unspecified: Secondary | ICD-10-CM | POA: Diagnosis not present

## 2022-08-06 DIAGNOSIS — I4892 Unspecified atrial flutter: Secondary | ICD-10-CM

## 2022-08-06 DIAGNOSIS — I480 Paroxysmal atrial fibrillation: Secondary | ICD-10-CM | POA: Diagnosis not present

## 2022-08-06 DIAGNOSIS — N186 End stage renal disease: Secondary | ICD-10-CM | POA: Diagnosis not present

## 2022-08-06 DIAGNOSIS — I5033 Acute on chronic diastolic (congestive) heart failure: Secondary | ICD-10-CM | POA: Insufficient documentation

## 2022-08-06 DIAGNOSIS — Z992 Dependence on renal dialysis: Secondary | ICD-10-CM | POA: Diagnosis not present

## 2022-08-06 DIAGNOSIS — J9621 Acute and chronic respiratory failure with hypoxia: Secondary | ICD-10-CM | POA: Diagnosis not present

## 2022-08-06 LAB — CBC
HCT: 24.3 % — ABNORMAL LOW (ref 39.0–52.0)
HCT: 23 % — ABNORMAL LOW (ref 39.0–52.0)
Hemoglobin: 7.9 g/dL — ABNORMAL LOW (ref 13.0–17.0)
Hemoglobin: 7.2 g/dL — ABNORMAL LOW (ref 13.0–17.0)
MCH: 34.6 pg — ABNORMAL HIGH (ref 26.0–34.0)
MCH: 33 pg (ref 26.0–34.0)
MCHC: 32.5 g/dL (ref 30.0–36.0)
MCHC: 31.3 g/dL (ref 30.0–36.0)
MCV: 106.6 fL — ABNORMAL HIGH (ref 80.0–100.0)
MCV: 105.5 fL — ABNORMAL HIGH (ref 80.0–100.0)
Platelets: 115 10*3/uL — ABNORMAL LOW (ref 150–400)
Platelets: 127 10*3/uL — ABNORMAL LOW (ref 150–400)
RBC: 2.28 MIL/uL — ABNORMAL LOW (ref 4.22–5.81)
RBC: 2.18 MIL/uL — ABNORMAL LOW (ref 4.22–5.81)
RDW: 15.8 % — ABNORMAL HIGH (ref 11.5–15.5)
RDW: 15.5 % (ref 11.5–15.5)
WBC: 7.8 10*3/uL (ref 4.0–10.5)
WBC: 7.5 10*3/uL (ref 4.0–10.5)
nRBC: 0 % (ref 0.0–0.2)
nRBC: 0 % (ref 0.0–0.2)

## 2022-08-06 LAB — BASIC METABOLIC PANEL
Anion gap: 14 (ref 5–15)
BUN: 32 mg/dL — ABNORMAL HIGH (ref 6–20)
CO2: 23 mmol/L (ref 22–32)
Calcium: 8.9 mg/dL (ref 8.9–10.3)
Chloride: 95 mmol/L — ABNORMAL LOW (ref 98–111)
Creatinine, Ser: 5.39 mg/dL — ABNORMAL HIGH (ref 0.61–1.24)
GFR, Estimated: 13 mL/min — ABNORMAL LOW (ref >60)
Glucose, Bld: 114 mg/dL — ABNORMAL HIGH (ref 70–99)
Potassium: 4.2 mmol/L (ref 3.5–5.1)
Sodium: 132 mmol/L — ABNORMAL LOW (ref 135–145)

## 2022-08-06 LAB — PHOSPHORUS: Phosphorus: 4.3 mg/dL (ref 2.5–4.6)

## 2022-08-06 LAB — TROPONIN I (HIGH SENSITIVITY): Troponin I (High Sensitivity): 50 ng/L — ABNORMAL HIGH (ref <18)

## 2022-08-06 LAB — HEPATITIS B SURFACE ANTIGEN: Hepatitis B Surface Ag: NONREACTIVE

## 2022-08-06 LAB — CBG MONITORING, ED: Glucose-Capillary: 99 mg/dL (ref 70–99)

## 2022-08-06 MED ORDER — LANTHANUM CARBONATE 500 MG PO CHEW
1000.0000 mg | CHEWABLE_TABLET | Freq: Three times a day (TID) | ORAL | Status: DC
  Administered 2022-08-07 – 2022-08-13 (×7): 1000 mg via ORAL
  Filled 2022-08-06 (×12): qty 2

## 2022-08-06 MED ORDER — ANTICOAGULANT SODIUM CITRATE 4% (200MG/5ML) IV SOLN
5.0000 mL | Status: DC | PRN
  Administered 2022-08-06: 5 mL
  Filled 2022-08-06 (×2): qty 5

## 2022-08-06 MED ORDER — TORSEMIDE 20 MG PO TABS: 80.0000 mg | ORAL_TABLET | Freq: Two times a day (BID) | ORAL | Status: DC

## 2022-08-06 MED ORDER — FUROSEMIDE 10 MG/ML IJ SOLN
160.0000 mg | Freq: Once | INTRAVENOUS | Status: DC
  Filled 2022-08-06: qty 16

## 2022-08-06 MED ORDER — CHLORHEXIDINE GLUCONATE CLOTH 2 % EX PADS
6.0000 | MEDICATED_PAD | Freq: Every day | CUTANEOUS | Status: DC
  Administered 2022-08-09 – 2022-08-12 (×4): 6 via TOPICAL

## 2022-08-06 MED ORDER — LIDOCAINE HCL (PF) 1 % IJ SOLN: 5.0000 mL | INTRAMUSCULAR | Status: DC | PRN

## 2022-08-06 MED ORDER — CHLORHEXIDINE GLUCONATE CLOTH 2 % EX PADS
6.0000 | MEDICATED_PAD | Freq: Every day | CUTANEOUS | Status: DC
  Administered 2022-08-07: 6 via TOPICAL

## 2022-08-06 MED ORDER — FERROUS SULFATE 325 (65 FE) MG PO TABS
325.0000 mg | ORAL_TABLET | Freq: Every day | ORAL | Status: DC
  Administered 2022-08-06 – 2022-08-07 (×2): 325 mg via ORAL
  Filled 2022-08-06 (×3): qty 1

## 2022-08-06 MED ORDER — TORSEMIDE 20 MG PO TABS
80.0000 mg | ORAL_TABLET | Freq: Two times a day (BID) | ORAL | Status: AC
  Filled 2022-08-06: qty 4

## 2022-08-06 MED ORDER — ALTEPLASE 2 MG IJ SOLR: 2.0000 mg | Freq: Once | INTRAMUSCULAR | Status: DC | PRN

## 2022-08-06 MED ORDER — OXYCODONE HCL 5 MG PO TABS
5.0000 mg | ORAL_TABLET | Freq: Once | ORAL | Status: AC
  Administered 2022-08-06: 5 mg via ORAL
  Filled 2022-08-06: qty 1

## 2022-08-06 MED ORDER — OXYCODONE HCL 5 MG PO TABS
5.0000 mg | ORAL_TABLET | Freq: Four times a day (QID) | ORAL | Status: DC | PRN
  Administered 2022-08-06 – 2022-08-13 (×20): 5 mg via ORAL
  Filled 2022-08-06 (×21): qty 1

## 2022-08-06 MED ORDER — PENTAFLUOROPROP-TETRAFLUOROETH EX AERO: 1.0000 | INHALATION_SPRAY | CUTANEOUS | Status: DC | PRN

## 2022-08-06 MED ORDER — DOXERCALCIFEROL 4 MCG/2ML IV SOLN
6.0000 ug | INTRAVENOUS | Status: DC
  Administered 2022-08-07 – 2022-08-12 (×3): 6 ug via INTRAVENOUS
  Filled 2022-08-06 (×5): qty 4

## 2022-08-06 MED ORDER — LIDOCAINE-PRILOCAINE 2.5-2.5 % EX CREA: 1.0000 | TOPICAL_CREAM | CUTANEOUS | Status: DC | PRN

## 2022-08-06 MED ORDER — CINACALCET HCL 30 MG PO TABS
30.0000 mg | ORAL_TABLET | ORAL | Status: DC
  Administered 2022-08-07: 30 mg via ORAL
  Filled 2022-08-06: qty 1

## 2022-08-06 NOTE — Evaluation (Signed)
Physical Therapy Evaluation Patient Details Name: Phillip Young MRN: [id number] DOB: [birth year] Today's Date: 08/06/2022  History of Present Illness  Pt is a 37 yo male admitted with volume overload and acute on chronic CHF. 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 This is pt's 4th hospitalization since Sept. Pt on 6 L O2 at home.  Clinical Impression  Pt admitted with above diagnosis. Pt was able to ambulate in room on his own as he had just walked back to bed from bathroom but pt with poor safety awareness as he was pushing the bedside table with O2 tank laid across the top to get to and from bathroom.  Pt with trash linens and other items all over the floor.  Pt stood and chatted a bit with PT and OT as assessments were made regarding balance and endurance.  Pt O2 sats on 6LO2 >88%.  DOE 3/4 with pt talking to staff.  Pt eventually states he was fatigued and sat on stretcher. Refused to walk more.  Left a Bariatric RW for pt to use and told him to call nursing to manage O2.  Pt desires a rollator for home and Bariatric rolllator recommended below. Should progress and go home with HHPT f/u.  Pt currently with functional limitations due to the deficits listed below (see PT Problem List). Pt will benefit from skilled PT to increase their independence and safety with mobility to allow discharge to the venue listed below.          Recommendations for follow up therapy are one component of a multi-disciplinary discharge planning process, led by the attending physician.  Recommendations may be updated based on patient status, additional functional criteria and insurance authorization.  Follow Up Recommendations Home health PT      Assistance Recommended at Discharge Intermittent Supervision/Assistance  Patient can return home with the following  A little help with walking and/or transfers;Assistance with cooking/housework;A little help  with bathing/dressing/bathroom;Help with stairs or ramp for entrance;Assist for transportation    Equipment Recommendations  (Bariatric rollator with O2 attachment)  Recommendations for Other Services       Functional Status Assessment Patient has had a recent decline in their functional status and demonstrates the ability to make significant improvements in function in a reasonable and predictable amount of time.     Precautions / Restrictions Precautions Precautions: Fall Restrictions Weight Bearing Restrictions: No Other Position/Activity Restrictions: Pt morbidly obese with swelling.      Mobility  Bed Mobility Overal bed mobility: Needs Assistance Bed Mobility: Supine to Sit, Sit to Supine     Supine to sit: Min guard Sit to supine: Min assist   General bed mobility comments: min assist to get LEs back in bed. pt very good at directing care on how to do this.    Transfers Overall transfer level: Needs assistance Equipment used: Rolling walker (2 wheels) Transfers: Sit to/from Stand, Bed to chair/wheelchair/BSC Sit to Stand: Supervision   Step pivot transfers: Supervision       General transfer comment: Pt moving around in room before therapy arrived with pt pushing rolling table for support.  Talked with pt about safer options than pushing rolling table and left bariatric walker in room for pt. Pt with DOE 3/4 and he didnt want to walk more around the room. Stood a bit with supervision prior to pt sitting back down.    Ambulation/Gait  Stairs            Wheelchair Mobility    Modified Rankin (Stroke Patients Only)       Balance Overall balance assessment: Needs assistance Sitting-balance support: Feet supported Sitting balance-Leahy Scale: Good     Standing balance support: During functional activity, Bilateral upper extremity supported Standing balance-Leahy Scale: Fair Standing balance comment: Pt stood without outside  support for short amounts of time but walker necessary for anything longer. Pt stated that when he stands straight up, he often feels dizzy and SOB but when he leans forward to support on a walker, this helps his breathing and dizziness.                             Pertinent Vitals/Pain Pain Assessment Pain Assessment: Faces Faces Pain Scale: Hurts even more Pain Location: B LES Pain Descriptors / Indicators: Discomfort, Grimacing, Guarding, Throbbing, Tightness Pain Intervention(s): Monitored during session, Limited activity within patient's tolerance, Repositioned    Home Living Family/patient expects to be discharged to:: Private residence Living Arrangements: Other relatives Available Help at Discharge: Family;Available PRN/intermittently Type of Home: House Home Access: Stairs to enter Entrance Stairs-Rails: Right Entrance Stairs-Number of Steps: 6   Home Layout: One level Home Equipment: Conservation officer, nature (2 wheels);Other (comment) (O2) Additional Comments: Pt requesting a rollator so he has a place to sit when walking.    Prior Function Prior Level of Function : Independent/Modified Independent             Mobility Comments: Not using assistive device and has only had one fall. 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     Hand Dominance   Dominant Hand: Right    Extremity/Trunk Assessment   Upper Extremity Assessment Upper Extremity Assessment: Defer to OT evaluation    Lower Extremity Assessment Lower Extremity Assessment: RLE deficits/detail;LLE deficits/detail RLE Deficits / Details: Appears WFL however limited hip flexion due to body habitus and ankle movement limited due to edema bil LEs LLE Deficits / Details: Appears WFL however limited hip flexion due to body habitus and ankle movement limited due to edema bil LEs    Cervical / Trunk Assessment Cervical / Trunk Assessment: Normal  Communication    Communication: No difficulties  Cognition Arousal/Alertness: Awake/alert Behavior During Therapy: Anxious Overall Cognitive Status: Within Functional Limits for tasks assessed                                 General Comments: appears intact although very impulsive by nature        General Comments General comments (skin integrity, edema, etc.): Pt most limited by body habitus, fluid on LEs and SOB.    Exercises General Exercises - Lower Extremity Ankle Circles/Pumps: AROM, Both, 5 reps, Supine Long Arc Quad: AROM, Both, 5 reps, Seated   Assessment/Plan    PT Assessment Patient needs continued PT services  PT Problem List Decreased activity tolerance;Decreased balance;Decreased mobility;Decreased knowledge of use of DME;Decreased safety awareness;Decreased knowledge of precautions;Cardiopulmonary status limiting activity;Decreased range of motion;Decreased strength       PT Treatment Interventions DME instruction;Gait training;Stair training;Functional mobility training;Therapeutic activities;Therapeutic exercise;Balance training;Patient/family education    PT Goals (Current goals can be found in the Care Plan section)  Acute Rehab PT Goals Patient Stated Goal: to go home PT Goal Formulation: With patient Time For  Goal Achievement: 08/20/22 Potential to Achieve Goals: Good    Frequency Min 3X/week     Co-evaluation PT/OT/SLP Co-Evaluation/Treatment: Yes Reason for Co-Treatment: Complexity of the patient's impairments (multi-system involvement);For patient/therapist safety PT goals addressed during session: Mobility/safety with mobility         AM-PAC PT "6 Clicks" Mobility  Outcome Measure Help needed turning from your back to your side while in a flat bed without using bedrails?: None Help needed moving from lying on your back to sitting on the side of a flat bed without using bedrails?: None Help needed moving to and from a bed to a chair (including a  wheelchair)?: A Little Help needed standing up from a chair using your arms (e.g., wheelchair or bedside chair)?: A Little Help needed to walk in hospital room?: A Little Help needed climbing 3-5 steps with a railing? : A Lot 6 Click Score: 19    End of Session Equipment Utilized During Treatment: Gait belt;Oxygen Activity Tolerance: Patient limited by fatigue Patient left: with call bell/phone within reach (on stretcher) Nurse Communication: Mobility status PT Visit Diagnosis: Muscle weakness (generalized) (M62.81)    Time: 1000-1021 PT Time Calculation (min) (ACUTE ONLY): 21 min   Charges:   PT Evaluation $PT Eval Moderate Complexity: 1 Mod          Mclane Arora M,PT Acute Rehab Services [phone]   Alvira Philips 08/06/2022, 2:11 PM

## 2022-08-06 NOTE — Addendum Note (Signed)
Addended by: Aldine Contes on: 08/06/2022 12:40 PM   Modules accepted: Level of Service

## 2022-08-06 NOTE — Progress Notes (Signed)
Internal Medicine Clinic Attending  I saw and evaluated the patient.  I personally confirmed the key portions of the history and exam documented by Dr.  Dean  and I reviewed pertinent patient test results.  The assessment, diagnosis, and plan were formulated together and I agree with the documentation in the resident's note.  

## 2022-08-06 NOTE — Progress Notes (Signed)
Rounding Note    Patient Name: Phillip Young Date of Encounter: 08/06/2022  [place] Cardiologist: [name], MD ***  Subjective   ***  Inpatient Medications    Scheduled Meds:  apixaban  5 mg Oral BID   [START ON 08/07/2022] Chlorhexidine Gluconate Cloth  6 each Topical Q0600   [START ON 08/07/2022] Chlorhexidine Gluconate Cloth  6 each Topical Q0600   [START ON 08/07/2022] cinacalcet  30 mg Oral Q T,Th,Sa-HD   [START ON 08/07/2022] doxercalciferol  6 mcg Intravenous Q T,Th,Sa-HD   doxycycline  100 mg Oral Q12H   ferrous sulfate  325 mg Oral Daily   fluticasone furoate-vilanterol  1 puff Inhalation Daily   lanthanum  1,000 mg Oral TID WC   melatonin  5 mg Oral QHS   torsemide  80 mg Oral BID   Continuous Infusions:  anticoagulant sodium citrate     PRN Meds: acetaminophen **OR** acetaminophen, alteplase, anticoagulant sodium citrate, lidocaine (PF), lidocaine-prilocaine, oxyCODONE, pentafluoroprop-tetrafluoroeth   Vital Signs    Vitals:   08/06/22 1700 08/06/22 1727 08/06/22 1736 08/06/22 1936  BP: 106/60 113/60 (!) 100/52 (!) 113/53  Pulse: (!) 101 (!) 102 (!) 104 (!) 108  Resp: (!) 24 18 16 20   Temp:   98.3 F (36.8 C) 100 F (37.8 C)  TempSrc:   Oral Oral  SpO2: 100% 100% 100% 100%  Weight:      Height:        Intake/Output Summary (Last 24 hours) at 08/06/2022 2033 Last data filed at 08/06/2022 1727 Gross per 24 hour  Intake --  Output 2600 ml  Net -2600 ml      08/05/2022    9:59 PM 08/05/2022    3:25 PM  Last 3 Weights  Weight (lbs) 426 lb 5.9 oz 426 lb 6.4 oz  Weight (kg) 193.4 kg 193.414 kg      Telemetry    *** - Personally Reviewed  ECG    *** - Personally Reviewed  Physical Exam  *** GEN: No acute distress.   Neck: No JVD Cardiac: RRR, no murmurs, rubs, or gallops.  Respiratory: Clear to auscultation bilaterally. GI: Soft, nontender, non-distended  MS: No edema; No deformity. Neuro:  Nonfocal  Psych: Normal  affect   Labs    High Sensitivity Troponin:   Recent Labs  Lab 08/05/22 1720 08/05/22 2032 08/06/22 1450  TROPONINIHS 14 51* 50*     Chemistry Recent Labs  Lab 08/05/22 1720 08/06/22 0534  NA 132* 132*  K 4.1 4.2  CL 96* 95*  CO2 21* 23  GLUCOSE 74 114*  BUN 25* 32*  CREATININE 4.53* 5.39*  CALCIUM 9.0 8.9  PROT 7.5  --   ALBUMIN 2.9*  --   AST 26  --   ALT 23  --   ALKPHOS 137*  --   BILITOT 0.5  --   GFRNONAA 16* 13*  ANIONGAP 15 14    Lipids No results for input(s): "CHOL", "TRIG", "HDL", "LABVLDL", "LDLCALC", "CHOLHDL" in the last 168 hours.  Hematology Recent Labs  Lab 08/05/22 1720 08/06/22 1030 08/06/22 1836  WBC 8.8 7.8 7.5  RBC 4.47 2.28* 2.18*  HGB 12.7* 7.9* 7.2*  HCT 39.3 24.3* 23.0*  MCV 87.9 106.6* 105.5*  MCH 28.4 34.6* 33.0  MCHC 32.3 32.5 31.3  RDW 12.9 15.8* 15.5  PLT 357 115* 127*   Thyroid No results for input(s): "TSH", "FREET4" in the last 168 hours.  BNP Recent Labs  Lab 08/05/22 1720  BNP 15.5    DDimer No results for input(s): "DDIMER" in the last 168 hours.   Radiology    DG Chest 2 View  Result Date: 08/05/2022 CLINICAL DATA:  Shortness of breath EXAM: CHEST - 2 VIEW COMPARISON:  Chest x-ray 07/08/2022.  Chest CT 06/30/2022. FINDINGS: Heart is enlarged, unchanged. There central pulmonary vascular congestion. Right-sided central venous catheter tip projects over the distal SVC. There is no lung consolidation, pleural effusion or pneumothorax. No acute fractures are seen. IMPRESSION: Cardiomegaly and central pulmonary vascular congestion. Electronically Signed   By: Ronney Asters M.D.   On: 08/05/2022 18:54    Cardiac Studies   TTE 02/2022: IMPRESSIONS     1. Left ventricular ejection fraction, by estimation, is 20 to 25%. The  left ventricle has severely decreased function. The left ventricle  demonstrates global hypokinesis. The left ventricular internal cavity size  was moderately dilated. Left  ventricular  diastolic parameters were normal.   2. Right ventricular systolic function is normal. The right ventricular  size is mildly enlarged. There is severely elevated pulmonary artery  systolic pressure. The estimated right ventricular systolic pressure is  38.4 mmHg.   3. Left atrial size was mildly dilated.   4. Right atrial size was mildly dilated.   5. The mitral valve is normal in structure. Mild to moderate mitral valve  regurgitation. No evidence of mitral stenosis.   6. The aortic valve is normal in structure. Aortic valve regurgitation is  not visualized. No aortic stenosis is present.   7. The inferior vena cava is normal in size with greater than 50%  respiratory variability, suggesting right atrial pressure of 3 mmHg.   Comparison(s): No significant change from prior study. Prior images  reviewed side by side.   Patient Profile     37 y.o. 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   Assessment & Plan    #Chronic systolic CHF: Nonischemic CMP, LHC 2021 with no CAD. Previous hospitalizations w/shock in setting of rapid AFib/Flutter requiring milrinone. Most recent echo 8/23 with EF 20-25%, RV mildly reduced.  - ESRD, volume controlled by HD.  Multiple recent hospitalizations for volume overload. Required CRRT during most recent admit to [facility] d/t difficult to manage volume. Has hx of missing outpatient HD sessions and refusing care.  - He is significantly volume overloaded. Steroids may be contributing. Recommend Nephrology consult for HD, he does not feel he needs HD at this time. - BP currently stable but may need to resume home midodrine 5 mg TID for him to tolerate HD - GDMT limited by CKD and hypotension requiring midodrine  - Not candidate  for ICD given obesity and dialysis  - Not candidate for advanced therapies. [place] discussions would be appropriate.  #Atrial flutter/fibrillation: History of multiple DCCVs. Amiodarone previously stopped due to elevated LFTs. Restarted 05/20/22 by Dr. Curt Bears.  Concern for possible amiodarone pulmonary toxicity w/ elevated ESR and CRP 11/23. He was seen by Pulmonary last admit (see below).  - Ablation at [facility] 12/23 - Maintaining SR  - Not candidate for amiodarone (lung and hepatic toxicity) or Tikosyn (ESRD), no anti-arrhythmic option.   - Would avoid dronedadrone with hx CHF. Apparently was on during recent admit, but not on discharge med list. - Continue Eliquis 5 mg bid   #  Possible amiodarone toxicity: Seen by pulmonary during admit 11/23. Had elevated ESR and sed rate. Started on prednisone at that time.  - Amiodarone has been stopped - Seen by pulmonary at [facility] 11/23 - Evaluated by Pulm at [facility] during [place] recent admit did not feel he had amio toxicity - On dexamethasone taper  #ESRD: HD per nephrology, on T,Th,Sat schedule.   - Significantly volume overloaded -Recommend Nephrology consult for iHD today  #Obesity: Body mass index is 57 - wt loss advised  #OSA: Continue CPAP QHS  #Lower extremity wounds: - Per primary  - Wound consulted  #Thrombocytopenia/anemia: - Hgb 12.7>7.9, Plt 357>115, ? sample - recheck CBC - On eliquis  {Are we signing off today?:[id number]}  For questions or updates, please contact [place] Please consult [email or web address] for contact info under        Signed, Freada Bergeron, MD  08/06/2022, 8:33 PM

## 2022-08-06 NOTE — ED Notes (Signed)
This nurse attempted to put side rails up on the stretcher and encouraged pt to slide over to the middle of bed, pt refused. Pt verbalized understanding that having side rails down is a safety concern; pt continues to refuse and states "I wont fall in the floor, if I do its on me". Bariatric bed has been ordered from [facility] but not yet arrived.

## 2022-08-06 NOTE — Consult Note (Signed)
Renal Service Consult Note [facility]  Vertis Scheib 08/06/2022 Sol Blazing, MD Requesting Physician: Dr. Jodi Mourning  Reason for Consult: ESRD pt w/  HPI: The patient is a 37 y.o. year-old w/ PMH as below who presented 1/11 to ED w/ c/o SOB and leg swelling. Pt was here in Nov 2023 for uncont afib, CHF and amio toxicity. Appears that since then he has been in and out of the [place]. Pt presents today w/ marked leg swelling, chronic SOB, on home O2. Pt was seen admitted by IM team for [facility], L ankle wound secondary to chronic LE edema, afib, ESRD and HFrEF (EF 25%). We are asked to see for dialysis.   Pt seen in ED.  Denies sig SOB at rest, +yes to marked LE edema.   CXR w/o overt edema, +vasc congestion   ROS - denies CP, no joint pain, no HA, no blurry vision, no rash, no diarrhea, no nausea/ vomiting   Past Medical History  Past Medical History:  Diagnosis Date   Acute on chronic respiratory failure with hypoxia ([place]) 04/21/2021   Acute on chronic systolic (congestive) heart failure ([place]) 02/26/2020   Biventricular congestive heart failure ([place])    Last Echo 11/2019 at [facility] reveals EF 20%   Class 3 severe obesity due to excess calories with serious comorbidity and body mass index (BMI) of 50.0 to 59.9 in adult [facility]) 02/26/2020   Essential hypertension 02/26/2020   GERD without esophagitis 02/26/2020   Hidradenitis suppurativa 02/26/2020   OSA (obstructive sleep apnea) 02/26/2020   Prediabetes 02/26/2020   Renal disorder    Past Surgical History  Past Surgical History:  Procedure Laterality Date   ABSCESS DRAINAGE     AV FISTULA PLACEMENT Left 08/21/2021   Procedure: LEFT ARM ARTERIOVENOUS (AV) FISTULA.;  Surgeon: Serafina Mitchell, MD;  Location: [place];  Service: Vascular;  Laterality: Left;   CARDIOVERSION N/A 10/09/2021   Procedure: CARDIOVERSION;  Surgeon: Larey Dresser, MD;  Location: [facility] ENDOSCOPY;  Service: Cardiovascular;  Laterality: N/A;    CARDIOVERSION N/A 05/28/2022   Procedure: CARDIOVERSION;  Surgeon: Larey Dresser, MD;  Location: [facility] ENDOSCOPY;  Service: Cardiovascular;  Laterality: N/A;   CARDIOVERSION N/A 06/07/2022   Procedure: CARDIOVERSION;  Surgeon: Larey Dresser, MD;  Location: [place];  Service: Cardiovascular;  Laterality: N/A;   IR FLUORO GUIDE CV LINE RIGHT  03/10/2021   IR FLUORO GUIDE CV LINE RIGHT  04/22/2021   IR FLUORO GUIDE CV LINE RIGHT  08/20/2021   IR US GUIDE VASC ACCESS RIGHT  03/10/2021   IR US GUIDE VASC ACCESS RIGHT  04/22/2021   RIGHT HEART CATH N/A 03/06/2021   Procedure: RIGHT HEART CATH;  Surgeon: Jolaine Artist, MD;  Location: [place] CV LAB;  Service: Cardiovascular;  Laterality: N/A;   RIGHT/LEFT HEART CATH AND CORONARY ANGIOGRAPHY N/A 03/04/2020   Procedure: RIGHT/LEFT HEART CATH AND CORONARY ANGIOGRAPHY;  Surgeon: Larey Dresser, MD;  Location: [place] CV LAB;  Service: Cardiovascular;  Laterality: N/A;   TEE WITHOUT CARDIOVERSION N/A 05/05/2021   Procedure: TRANSESOPHAGEAL ECHOCARDIOGRAM (TEE);  Surgeon: Larey Dresser, MD;  Location: [facility] ENDOSCOPY;  Service: Cardiovascular;  Laterality: N/A;   TEMPORARY DIALYSIS CATHETER  03/06/2021   Procedure: TEMPORARY DIALYSIS CATHETER;  Surgeon: Jolaine Artist, MD;  Location: [place] CV LAB;  Service: Cardiovascular;;   Family History  Family History  Problem Relation Age of Onset   Heart disease Mother    Hypertension Mother  Pulmonary Hypertension Mother    Drug abuse Father        died due to Heroin overdose   Social History  reports that he quit smoking about 5 years ago. His smoking use included cigarettes. He smoked an average of 1 pack per day. He has been exposed to tobacco smoke. He has quit using smokeless tobacco. He reports that he does not drink alcohol and does not use drugs. 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 Authorizing Provider  apixaban (ELIQUIS) 5 MG TABS tablet Take 1 tablet (5 mg total) by mouth 2 (two) times daily. 12/16/21  Yes Larey Dresser, MD  doxycycline (VIBRA-TABS) 100 MG tablet Take 100 mg by mouth every 12 (twelve) hours. 08/04/22 08/11/22 Yes [provider]  ferrous sulfate 325 (65 FE) MG tablet Take 1 tablet (325 mg total) by mouth daily. 06/10/22 06/10/23 Yes Serita Butcher, MD  lanthanum (FOSRENOL) 1000 MG chewable tablet Chew 2,000 mg by mouth 3 (three) times daily with meals.   Yes [provider]  diclofenac Sodium (VOLTAREN) 1 % GEL Apply 4 g topically 3 (three) times daily as needed (joint pains). Patient not taking: Reported on 08/06/2022 03/28/22   Lacinda Axon, MD  fluticasone furoate-vilanterol (BREO ELLIPTA) 100-25 MCG/ACT AEPB Inhale 1 puff into the lungs daily. Patient not taking: Reported on 08/06/2022 06/11/22   Serita Butcher, MD  melatonin 5 MG TABS Take 1 tablet (5 mg total) by mouth at bedtime. 06/10/22   Serita Butcher, MD  Menthol (HALLS COUGH DROPS) 5.8 MG LOZG Use as directed 1 lozenge in the mouth or throat every 4 (four) hours as needed (cough). Patient not taking: Reported on 08/06/2022    [provider]  metoprolol succinate (TOPROL-XL) 25 MG 24 hr tablet TAKE 1 TABLET(25 MG) BY MOUTH TWICE DAILY Patient not taking: Reported on 08/06/2022 06/10/22   [name], MD  midodrine (PROAMATINE) 5 MG tablet TAKE 1 TABLET(5 MG) BY MOUTH THREE TIMES DAILY WITH MEALS Patient not taking: Reported on 08/06/2022 06/10/22   [name], MD  torsemide (DEMADEX) 100 MG tablet  TAKE 1 TABLET BY MOUTH 3 TIMES A WEEK ON NON-DIALYSIS DAYS. 06/10/22   [name], MD     Vitals:   08/06/22 0730 08/06/22 0800 08/06/22 1100 08/06/22 1244  BP: (!) 139/121 (!) 110/93 109/68   Pulse: (!) 104 (!) 106 100   Resp: 20 16 (!) 21   Temp:  98.6 F (37 C)  98.5 F (36.9 C)  TempSrc:    Oral  SpO2: 98% 90% 100%   Weight:      Height:       Exam Gen alert, no distress, 5 L [place] No rash, cyanosis or gangrene Sclera anicteric, throat clear  No jvd or bruits Chest mostly clear bilat, occ scattered mild rales RRR no MRG Abd soft ntnd no mass or ascites +bs GU deferred MS no joint effusions or deformity Ext diffuse anasarca of bilat LE's w/ bandages on the lower legs bilat (not removed) Neuro is alert, Ox 3 , nf, no asterixis  RIJ TDC intact    Home meds include - eliquis 5 bid, fosrenol 1-2 gm po tid, breo ellipta, midodrine 5 tid, prns/ vits/ supps     OP HD: TTS [facility]  4h 74min  425/ 1.5  180kg  3K/2.5Ca bath  RIJ TDC  Heparin NONE - uses citrate for catheter lock solution - last HD at [facility] was 1/11, poast wt was 192 kg, 4 kg removed - prior HD at [facility] was 12/09 and prior to that, 11/25 - CE shows that pt was admitted at [facility] from 07/06/22 to 08/04/22 - venofer 50 weekly - hectorol 6 mcg tiw IV - sensipar 30 mg po tiw - mircera 225 mcg IV q2, last on 1/11, due on 1/25  Assessment/ Plan: A/C hypoxic resp failure - on higher O2 here than usually uses at home, but no edema on CXR, +vasc congestion. Obvious vol overload in terms of LE edema. Will plan extra HD today while here, get vol down as tolerated.  HFrEF - EF 25%, per pmd ESRD - on HD TTS. Pt has been IP at [facility], dc'd 2 days ago. Will get records. HD today and tomorrow, chip away at marked vol overload.  HTN/ volume - 12kg over dry after HD yesterday. Obvious anasarca as above Anemia esrd - Hb 7 then 12, not sure which one is correct.  MBD ckd - CCa in range, add on phos. Says he is only taking 1000mg  of  fosrenol with meals as phos binder. Will change. Cont IV vdra and senispar and lower dose fosrenol.  Chron resp failure - due to MO/ OSA/ OHS      Rob Sia Gabrielsen  MD 08/06/2022, 2:02 PM Recent Labs  Lab 08/05/22 1720 08/06/22 0534 08/06/22 1030  HGB 12.7*  --  7.9*  ALBUMIN 2.9*  --   --   CALCIUM 9.0 8.9  --   CREATININE 4.53* 5.39*  --   K 4.1 4.2  --    Inpatient medications:  apixaban  5 mg Oral BID   [START ON 08/07/2022] Chlorhexidine Gluconate Cloth  6 each Topical Q0600   doxycycline  100 mg Oral Q12H   ferrous sulfate  325 mg Oral Daily   fluticasone furoate-vilanterol  1 puff Inhalation Daily   lanthanum  2,000 mg Oral TID WC   melatonin  5 mg Oral QHS   torsemide  80 mg Oral BID    acetaminophen **OR** acetaminophen, alteplase, HYDROmorphone, lidocaine (PF), lidocaine-prilocaine, pentafluoroprop-tetrafluoroeth

## 2022-08-06 NOTE — TOC Initial Note (Signed)
Transition of Care [facility]) - Initial/Assessment Note    Patient Details  Name: Phillip Young MRN: [id number] Date of Birth: 1986/04/14  Transition of [facility]) CM/SW Contact:    Erenest Rasher, RN Phone Number:[phone] 08/06/2022, 6:12 PM  Clinical Narrative:                 HF TOC CM spoke to pt at bedside. Pt states he needs assistance with getting his paperwork completed for his job for his disability and [employer] for compensation. CM sent email address to pt's mother and she will have paperwork emailed. Pt requesting Rollator for home. Will need HH PT and RN orders with F2F, will need [facility] RN for disease management.   Expected Discharge Plan: Home/Self Care Barriers to Discharge: Continued Medical Work up   Patient Goals and CMS Choice Patient states their goals for this hospitalization and ongoing recovery are:: wants to get better          Expected Discharge Plan and Services   Discharge Planning Services: CM Consult   Living arrangements for the past 2 months: Single Family Home                                      Prior Living Arrangements/Services Living arrangements for the past 2 months: Single Family Home Lives with:: Parents Patient language and need for interpreter reviewed:: Yes        Need for Family Participation in Patient Care: Yes (Comment) Care giver support system in place?: Yes (comment) Current home services: DME ([employer], oxygen, scale, rolling walker, cane) Criminal Activity/Legal Involvement Pertinent to Current Situation/Hospitalization: No - Comment as needed  Activities of Daily Living      Permission Sought/Granted Permission sought to share information with : Case Manager, Family Supports, PCP Permission granted to share information with : Yes, Verbal Permission Granted  Share Information with NAME: Dondrell Loudermilk     Permission granted to share info w Relationship: mother  Permission granted to share info w Contact  Information: [phone]  Emotional Assessment Appearance:: Appears stated age Attitude/Demeanor/Rapport: Engaged Affect (typically observed): Accepting Orientation: : Oriented to Self, Oriented to Place, Oriented to  Time, Oriented to Situation   Psych Involvement: No (comment)  Admission diagnosis:  Shortness of breath [R06.02] Peripheral edema [R60.9] Acute exacerbation of CHF (congestive heart failure) ([name]) [I50.9] Patient Active Problem List   Diagnosis Date Noted   Acute on chronic combined systolic and diastolic CHF (congestive heart failure) ([place]) 08/06/2022   Decompensated heart failure ([place]) 08/05/2022   Tachycardia 08/05/2022   Acute exacerbation of CHF (congestive heart failure) ([place]) 08/05/2022   Elevated liver enzymes 06/08/2022   Elevated sed rate (elev SR) 06/01/2022   Elevated C-reactive protein 06/01/2022   Acute on chronic respiratory failure with hypoxia ([place]) 06/01/2022   Multiple nodules of lung 05/31/2022   Recurrent infections 09/30/2021   Reactive depression 09/02/2021   Medical non-compliance 04/21/2021   Hypotension 04/21/2021   ESRD (end stage renal disease) on dialysis ([place]) 04/16/2021   Paroxysmal atrial flutter ([place]) 04/12/2021   Chronic respiratory failure with hypoxia ([name]) 81/44/8185   Chronic systolic CHF (congestive heart failure) ([name])    Chronic cough    Morbid obesity ([place]) 02/26/2020   GERD without esophagitis 02/26/2020   OSA (obstructive sleep apnea) 02/26/2020   Hidradenitis suppurativa 02/26/2020   Prediabetes 02/26/2020   PCP:  [name], MD Pharmacy:  [id number] - [place], [place] - [address] [place] [address] Phone: [phone] Fax: [phone]     Social Determinants of Health ([place]) Social History: [place]: No Food Insecurity (05/23/2022)  Housing: Low Risk  (05/23/2022)  Transportation Needs: No  Transportation Needs (05/23/2022)  Utilities: Not At Risk (05/23/2022)  Depression (PHQ2-9): Low Risk  (04/09/2022)  Financial Resource Strain: Low Risk  (04/13/2021)  Tobacco Use: Medium Risk (08/05/2022)   SDOH Interventions:     Readmission Risk Interventions    06/09/2022   12:19 PM 05/12/2021   10:35 AM 10/16/2020    2:10 PM  Readmission Risk Prevention Plan  Transportation Screening Complete Complete Complete  PCP or Specialist Appt within 5-7 Days   Complete  Home Care Screening   Complete  Medication Review (RN CM)   Referral to Pharmacy  Medication Review (RN Care Manager) Complete Complete   PCP or Specialist appointment within 3-5 days of discharge Complete Complete   HRI or Home Care Consult Complete Complete   SW Recovery Care/Counseling Consult Complete Complete   Palliative Care Screening Not Applicable Complete   [place] Not Applicable Complete

## 2022-08-06 NOTE — Plan of Care (Signed)
  Problem: Education: Goal: Ability to demonstrate management of disease process will improve Outcome: Progressing   Problem: Activity: Goal: Capacity to carry out activities will improve Outcome: Progressing   

## 2022-08-06 NOTE — Progress Notes (Signed)
Patient states unrelieved pain requesting 10 mg of Oxi  Dr. Jodi Mourning paged

## 2022-08-06 NOTE — Evaluation (Signed)
Occupational Therapy Evaluation Patient Details Name: Phillip Young MRN: [id number] DOB: [birth year] Today's Date: 08/06/2022   History of Present Illness Pt is a 37 yo male admitted with volume overload and acute on chronic CHF. 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 This is pt's 4th hospitalization since Sept. Pt on 6 L O2 at home.   Clinical Impression   Pt was admitted with the above diagnosis and has the deficits outlined below. Pt would benefit from cont OT to increase independence in basic adls and adl transfers as well as educate pt on energy conservation due to most recent hospitalizations dealing with SOB, dyspnea and CHF.  Pt will need further education on CHF precautions as evidenced by his noncompliance in the past causing more readmissions.  Will continue to see and feel pt should be able to eventually dc home with uncle there PRN.      Recommendations for follow up therapy are one component of a multi-disciplinary discharge planning process, led by the attending physician.  Recommendations may be updated based on patient status, additional functional criteria and insurance authorization.   Follow Up Recommendations  No OT follow up     Assistance Recommended at Discharge Intermittent Supervision/Assistance  Patient can return home with the following A little help with walking and/or transfers;A little help with bathing/dressing/bathroom;Assistance with cooking/housework;Assist for transportation;Help with stairs or ramp for entrance    Functional Status Assessment  Patient has had a recent decline in their functional status and demonstrates the ability to make significant improvements in function in a reasonable and predictable amount of time.  Equipment Recommendations  Other (comment) (tbd)    Recommendations for Other Services       Precautions / Restrictions Precautions Precautions:  Fall Restrictions Weight Bearing Restrictions: No Other Position/Activity Restrictions: Pt morbidly obese with swelling.      Mobility Bed Mobility Overal bed mobility: Needs Assistance Bed Mobility: Supine to Sit, Sit to Supine     Supine to sit: Min guard Sit to supine: Min assist   General bed mobility comments: min assist to get LEs back in bed. pt very good at directing care on how to do this.    Transfers Overall transfer level: Needs assistance Equipment used: Rolling walker (2 wheels) Transfers: Sit to/from Stand, Bed to chair/wheelchair/BSC Sit to Stand: Supervision     Step pivot transfers: Supervision     General transfer comment: Pt moving around in room before therapy arrived pushing rolling table for support.  Talked with pt about safer options than pushing rolling table and left walker in room for pt.      Balance Overall balance assessment: Needs assistance Sitting-balance support: Feet supported Sitting balance-Leahy Scale: Good     Standing balance support: During functional activity, Bilateral upper extremity supported Standing balance-Leahy Scale: Fair Standing balance comment: Pt stood without outside support for short amounts of time but walker necessary for anything longer. Pt stated that when he stands straight up, he often feels dizzy and SOB but when he leans forward to support on a walker, this helps his breathing and dizziness.                           ADL either performed or assessed with clinical judgement   ADL Overall ADL's : Needs assistance/impaired Eating/Feeding: Set up;Sitting   Grooming: Wash/dry hands;Wash/dry face;Oral care;Applying deodorant;Brushing hair;Set up;Standing   Upper Body Bathing: Set  up;Sitting   Lower Body Bathing: Moderate assistance;Sit to/from stand;Cueing for compensatory techniques Lower Body Bathing Details (indicate cue type and reason): Pt with excessive fluid on LEs making LE adls  difficult Upper Body Dressing : Set up;Sitting   Lower Body Dressing: Moderate assistance;Sit to/from stand;Cueing for compensatory techniques Lower Body Dressing Details (indicate cue type and reason): swelling in LEs making this difficult Toilet Transfer: Ambulation;Comfort height toilet;Min guard;Grab bars Toilet Transfer Details (indicate cue type and reason): Pt walked himself to bathroom with O2 tank on rolling table prior to therapist coming without incident. Rolling walker left in room so pt can be safer with mobility to the bathroom. Toileting- Clothing Manipulation and Hygiene: Supervision/safety;Sit to/from stand       Functional mobility during ADLs: Min guard;Rolling walker (2 wheels) General ADL Comments: Pt most limited with BLE adls due to swelling and functional mobility due to foot pain and weeping.     Vision Baseline Vision/History: 0 No visual deficits Ability to See in Adequate Light: 0 Adequate Patient Visual Report: No change from baseline Vision Assessment?: No apparent visual deficits     Perception Perception Perception Tested?: No   Praxis Praxis Praxis tested?: Within functional limits    Pertinent Vitals/Pain Pain Assessment Pain Assessment: Faces Faces Pain Scale: Hurts even more Pain Location: B LES Pain Descriptors / Indicators: Discomfort, Grimacing, Guarding, Throbbing, Tightness Pain Intervention(s): Limited activity within patient's tolerance, Monitored during session, Repositioned     Hand Dominance Right   Extremity/Trunk Assessment Upper Extremity Assessment Upper Extremity Assessment: Overall WFL for tasks assessed   Lower Extremity Assessment Lower Extremity Assessment: Defer to PT evaluation   Cervical / Trunk Assessment Cervical / Trunk Assessment: Normal   Communication Communication Communication: No difficulties   Cognition Arousal/Alertness: Awake/alert Behavior During Therapy: Anxious Overall Cognitive Status:  Within Functional Limits for tasks assessed                                 General Comments: appears intact although very impulsive by nature     General Comments  Pt most limited by body habitus, fluid on LEs and SOB.    Exercises     Shoulder Instructions      Home Living Family/patient expects to be discharged to:: Private residence Living Arrangements: Other relatives Available Help at Discharge: Family;Available PRN/intermittently Type of Home: House Home Access: Stairs to enter [employer] of Steps: 6 Entrance Stairs-Rails: Right Home Layout: One level     Bathroom Shower/Tub: Teacher, early years/pre: Handicapped height     Home Equipment: Conservation officer, nature (2 wheels);Other (comment) (O2)   Additional Comments: Pt requesting a rollator so he has a place to sit when walking.      Prior Functioning/Environment Prior Level of Function : Independent/Modified Independent             Mobility Comments: Not using assistive device and has only had one fall. 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        OT Problem List: Decreased range of motion;Decreased activity tolerance;Impaired balance (sitting and/or standing);Decreased safety awareness;Decreased knowledge of use of DME or AE;Cardiopulmonary status limiting activity;Increased edema      OT Treatment/Interventions: Self-care/ADL training;Energy conservation;Therapeutic activities;Balance training    OT Goals(Current goals can be found in the care plan section) Acute Rehab OT Goals Patient Stated Goal: to get this fluid off  OT Goal Formulation: With patient Time For Goal Achievement: 08/20/22 Potential to Achieve Goals: Good  OT Frequency: Min 2X/week    Co-evaluation PT/OT/SLP Co-Evaluation/Treatment: Yes Reason for Co-Treatment: For patient/therapist safety PT goals addressed during session: Mobility/safety with mobility OT  goals addressed during session: ADL's and self-care      AM-PAC OT "6 Clicks" Daily Activity     Outcome Measure Help from another person eating meals?: None Help from another person taking care of personal grooming?: None Help from another person toileting, which includes using toliet, bedpan, or urinal?: A Little Help from another person bathing (including washing, rinsing, drying)?: A Little Help from another person to put on and taking off regular upper body clothing?: None Help from another person to put on and taking off regular lower body clothing?: A Lot 6 Click Score: 20   End of Session Equipment Utilized During Treatment: Rolling walker (2 wheels);Oxygen Nurse Communication: Mobility status  Activity Tolerance: Patient limited by fatigue Patient left: in bed;with call bell/phone within reach  OT Visit Diagnosis: Unsteadiness on feet (R26.81)                Time: 1000-1021 OT Time Calculation (min): 21 min Charges:  OT General Charges $OT Visit: 1 Visit OT Evaluation $OT Eval Low Complexity: 1 Low  Glenford Peers 08/06/2022, 11:26 AM

## 2022-08-06 NOTE — Progress Notes (Signed)
Subjective:   Summary: Phillip Young is a 37 y.o. year old male currently admitted on the IMTS HD#1 for acute heart failure exacerbation.  Overnight Events: No overnight events   Patient was seen this a.m. bedside.  He says he cannot breathe while lying down. He loses "wind" if he is moving and walking around. With pain medications administered, he is now able to breathe better and move. [facility] has changed his medications per patient. He is only able to take a few steps at a time before he gets dizzy and has to abruptly sit down. No change in symptoms since last admission on IMTS.    Objective:  Vital signs in last 24 hours: Vitals:   08/06/22 0730 08/06/22 0800 08/06/22 1100 08/06/22 1244  BP: (!) 139/121 (!) 110/93 109/68   Pulse: (!) 104 (!) 106 100   Resp: 20 16 (!) 21   Temp:  98.6 F (37 C)  98.5 F (36.9 C)  TempSrc:    Oral  SpO2: 98% 90% 100%   Weight:      Height:       Supplemental O2: Nasal Cannula SpO2: 100 % O2 Flow Rate (L/min): 6 L/min FiO2 (%): 40 %   Physical Exam:  Constitutional: Ill appearing, unkempt male, sitting upright in bed Cardiovascular: RRR, no murmurs, rubs or gallops Pulmonary/Chest: normal work of breathing on room air, diffuse crackles heard, no wheezing Abdominal: soft, non-tender, non-distended Skin: warm and dry, 3-5cm lesion on left lateral malleolus with serous drainage, as well as on inguinal area on R leg. Extremities: upper/lower extremity pulses 2+, +3 lower extremity edema  Filed Weights   08/05/22 2159  Weight: (!) 193.4 kg    No intake or output data in the 24 hours ending 08/06/22 1421 Net IO Since Admission: No IO data has been entered for this period [08/06/22 1421]  Pertinent Labs:    Latest Ref Rng & Units 08/06/2022   10:30 AM 08/05/2022    5:20 PM 06/10/2022    4:52 AM  CBC  WBC 4.0 - 10.5 K/uL 7.8  8.8  16.2   Hemoglobin 13.0 - 17.0 g/dL 7.9  12.7  9.1   Hematocrit 39.0 - 52.0 %  24.3  39.3  27.5   Platelets 150 - 400 K/uL 115  357  258        Latest Ref Rng & Units 08/06/2022    5:34 AM 08/05/2022    5:20 PM 06/10/2022    4:52 AM  CMP  Glucose 70 - 99 mg/dL 114  74  125   BUN 6 - 20 mg/dL 32  25  73   Creatinine 0.61 - 1.24 mg/dL 5.39  4.53  9.51   Sodium 135 - 145 mmol/L 132  132  132   Potassium 3.5 - 5.1 mmol/L 4.2  4.1  3.9   Chloride 98 - 111 mmol/L 95  96  93   CO2 22 - 32 mmol/L 23  21  20    Calcium 8.9 - 10.3 mg/dL 8.9  9.0  7.8   Total Protein 6.5 - 8.1 g/dL  7.5  7.3   Total Bilirubin 0.3 - 1.2 mg/dL  0.5  1.0   Alkaline Phos 38 - 126 U/L  137  227   AST 15 - 41 U/L  26  142   ALT 0 - 44 U/L  23  916  Imaging: DG Chest 2 View  Result Date: 08/05/2022 CLINICAL DATA:  Shortness of breath EXAM: CHEST - 2 VIEW COMPARISON:  Chest x-ray 07/08/2022.  Chest CT 06/30/2022. FINDINGS: Heart is enlarged, unchanged. There central pulmonary vascular congestion. Right-sided central venous catheter tip projects over the distal SVC. There is no lung consolidation, pleural effusion or pneumothorax. No acute fractures are seen. IMPRESSION: Cardiomegaly and central pulmonary vascular congestion. Electronically Signed   By: Ronney Asters M.D.   On: 08/05/2022 18:54     Assessment/Plan:   Active Problems:   OSA (obstructive sleep apnea)   Hidradenitis suppurativa   Paroxysmal atrial flutter ([name])   ESRD (end stage renal disease) on dialysis ([place])   Acute on chronic respiratory failure with hypoxia ([name])   Acute exacerbation of CHF (congestive heart failure) ([place])   Patient Summary: Phillip Young is a 37 year old male with a past medical history of morbid obesity, HFrEF, AFib-AFlutter, ESRD, OSA, OHS, and IAD who presents with breath shortness and leg swelling    #Acute on Chronic Hypoxic Respiratory Failure  #HFrEF  #ESRD #Morbid Obesity complicated by OSA Patient was brought to the emergency department complaining of shortness of breath.  Upon arrival,  patient was tachypneic and had labored breathing and requiring 6 L of oxygen.  Patient is severely volume overloaded on exam.  Heart failure team has seen him, and patient has declined many treatment options for them.  Most recent echo was on February 24, 2022 which showed an ejection fraction of 20 to 25%, with right ventricle mildly reduced function.  Patient also has significant history of refusing care and listening outpatient hemodialysis sessions.  He was started on steroids for amiodarone toxicity, who attributes his volume overload to this.  Per records, GDMT limited by CKD and hypotension requiring midodrine.,  He is not a good candidate for ICD placement given obesity and dialysis.  Plan: - P.o. torsemide 80 mg twice daily - Nephrology on board, appreciate recommendations - Will get dialysis today to help with volume overload state - Will follow-up cardiology recommendations - Daily weights - Monitor I's/O's - BiPAP nightly  # Paroxysmal atrial fibrillation/flutter Patient is currently in sinus rhythm however tachycardia.  Will continue apixaban and appreciate cardiology recommendations.  Of note he is status post multiple ab[facility]ations [facility].  He is a very poor candidate for AV nodal ablation with by ventricular pacing due to end-stage renal disease and infection risk.  No current antiarrhythmic option.  Plan: - Eliquis 5 mg twice daily  # Left lateral malleolus wound secondary to chronic edema Patient has multiple wounds on left lateral malleolus, also right inguinal area and on right foot as well.  Likely secondary to chronic edema and skin breakdown.  Wound consult has been placed.  Will need to ensure proper dressings of wounds.  Plan: - Tylenol 60 mg every 6 as needed - Hydromorphone 1 mg every 6 as needed   # Iron deficiency anemia Most recent hemoglobin 7, will need to monitor with daily CBCs.  Plan: - Ferrous sulfate 325 mg   Diet: Heart  Healthy IVF: None,None VTE: NOAC Code: Full PT/OT recs: Home Health, walker.    Dispo: Anticipated discharge to Home in 2 days pending medical management.   Drucie Opitz, MD PGY-1 Internal Medicine Resident Pager Number [phone] Please contact the on call pager after 5 pm and on weekends at [phone].

## 2022-08-06 NOTE — Progress Notes (Deleted)
Advanced Heart Failure Rounding Note  PCP-Cardiologist: [name], MD    Patient Profile   37 y.o. 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  Multiple recent admissions to [facility] and [facility] with acute on chronic hypoxic respiratory failure and volume overload + amiodarone induced lung toxicity for which he was treated with steroids.  Attempted AF ablation at [facility] 06/30/22. Discharged in [place].  Most recently admitted to [facility] for a month (discharge 01/10) with volume overload requiring CRRT and recurrent AF with RVR. Apparently started on dronedadrone. Refused much of care at end of stay and presenting to [facility] 01/11 with volume overload and LE wounds.  Admitted overnight with volume overload and LE wounds.   Subjective:    Seen in ED this am.   Complaining of lower extremity pain especially at site of wounds.   He does not feel that he needs HD.       Objective:   Weight Range: (!) 193.4 kg Body mass index is 57.83 kg/m.   Vital Signs:   Temp:  [98.3 F (36.8 C)-100 F (37.8 C)] 100 F (37.8 C) (01/12 1936) Pulse Rate:  [95-115] 108 (01/12 1936) Resp:  [16-24] 20 (01/12 1936) BP: (95-146)/(46-123) 113/53 (01/12 1936) SpO2:  [90 %-100 %] 100 % (01/12 1936) FiO2 (%):  [40 %] 40 % (01/11 2355) Weight:  [193.4 kg] 193.4 kg (01/11 2159)    Weight change: Filed Weights   08/05/22 2159  Weight: (!) 193.4 kg    Intake/Output:   Intake/Output Summary (Last 24 hours) at 08/06/2022 2033 Last data filed at 08/06/2022 1727 Gross per 24 hour  Intake --  Output 2600 ml  Net -2600 ml      Physical Exam    General:  Chronically ill appearing. HEENT: Normal Neck: Supple. JVP difficult d/t neck size. Carotids 2+ bilat; no bruits.  Cor: PMI nondisplaced. Regular rate & rhythm, tachy.  No rubs, gallops or murmurs. Lungs: diminished Abdomen: obese, soft, nontender, + distended. No hepatosplenomegaly. No bruits or masses. Good bowel sounds. Extremities: 4+ lower extremity edema to waist, multiple wheeping wounds on lower extremities Neuro: Alert & orientedx3, cranial nerves grossly intact. Affect pleasant   Telemetry   Sinus tach 100s  EKG    ST 111 bpm  Labs    CBC Recent Labs    08/06/22 1030 08/06/22 1836  WBC 7.8 7.5  HGB 7.9* 7.2*  HCT 24.3* 23.0*  MCV 106.6* 105.5*  PLT 115* 127*    Basic Metabolic Panel Recent Labs    08/05/22 1720 08/06/22 0534 08/06/22 1450  NA 132* 132*  --   K 4.1 4.2  --   CL 96* 95*  --   CO2 21* 23  --   GLUCOSE 74 114*  --   BUN 25* 32*  --   CREATININE 4.53* 5.39*  --   CALCIUM 9.0 8.9  --   PHOS  --   --  4.3    Liver Function Tests Recent Labs    08/05/22 1720  AST 26  ALT 23  ALKPHOS 137*  BILITOT 0.5  PROT 7.5  ALBUMIN 2.9*    No results for input(s): "LIPASE", "AMYLASE" in the last 72 hours. Cardiac Enzymes No results for input(s): "CKTOTAL", "CKMB", "CKMBINDEX", "TROPONINI" in the last 72 hours.  BNP: BNP (last 3 results) Recent  Labs    03/26/22 1958 05/22/22 0732 08/05/22 1720  BNP 374.2* 373.1* 15.5     ProBNP (last 3 results) No results for input(s): "PROBNP" in the last 8760 hours.   D-Dimer No results for input(s): "DDIMER" in the last 72 hours. Hemoglobin A1C No results for input(s): "HGBA1C" in the last 72 hours. Fasting Lipid Panel No results for input(s): "CHOL", "HDL", "LDLCALC", "TRIG", "CHOLHDL", "LDLDIRECT" in the last 72 hours. Thyroid Function Tests No results for input(s): "TSH", "T4TOTAL", "T3FREE", "THYROIDAB" in the last 72 hours.  Invalid input(s): "FREET3"  Other results:   Imaging    No results found.   Medications:     Scheduled Medications:  apixaban  5 mg Oral BID   [START ON 08/07/2022] Chlorhexidine Gluconate Cloth  6 each Topical  Q0600   [START ON 08/07/2022] Chlorhexidine Gluconate Cloth  6 each Topical Q0600   [START ON 08/07/2022] cinacalcet  30 mg Oral Q T,Th,Sa-HD   [START ON 08/07/2022] doxercalciferol  6 mcg Intravenous Q T,Th,Sa-HD   doxycycline  100 mg Oral Q12H   ferrous sulfate  325 mg Oral Daily   fluticasone furoate-vilanterol  1 puff Inhalation Daily   lanthanum  1,000 mg Oral TID WC   melatonin  5 mg Oral QHS   torsemide  80 mg Oral BID    Infusions:  anticoagulant sodium citrate      PRN Medications: acetaminophen **OR** acetaminophen, alteplase, anticoagulant sodium citrate, lidocaine (PF), lidocaine-prilocaine, oxyCODONE, pentafluoroprop-tetrafluoroeth     Assessment/Plan   1.  Chronic systolic CHF: Nonischemic CMP, LHC 2021 with no CAD. Previous hospitalizations w/shock in setting of rapid AFib/Flutter requiring milrinone. Most recent echo 8/23 with EF 20-25%, RV mildly reduced.  - ESRD, volume controlled by HD.  Multiple recent hospitalizations for volume overload. Required CRRT during most recent admit to [facility] d/t difficult to manage volume. Has hx of missing outpatient HD sessions and refusing care.  - He is significantly volume overloaded. Steroids may be contributing. Recommend Nephrology consult for HD, he does not feel he needs HD at this time. - BP currently stable but may need to resume home midodrine 5 mg TID for him to tolerate HD - GDMT limited by CKD and hypotension requiring midodrine  - Not candidate for ICD given obesity and dialysis  - Not candidate for advanced therapies. [place] discussions would be appropriate. 2. Atrial flutter/fibrillation: History of multiple DCCVs. Amiodarone previously stopped due to elevated LFTs. Restarted 05/20/22 by Dr. Curt Bears.  Concern for possible amiodarone pulmonary toxicity w/ elevated ESR and CRP 11/23. He was seen by Pulmonary last admit (see below).  - Ablation at [facility] 12/23 - Maintaining SR  - Not candidate for amiodarone (lung and hepatic  toxicity) or Tikosyn (ESRD), no anti-arrhythmic option.   - Would avoid dronedadrone with hx CHF. Apparently was on during recent admit, but not on discharge med list. - Continue Eliquis 5 mg bid  3. Possible amiodarone toxicity: Seen by pulmonary during admit 11/23. Had elevated ESR and sed rate. Started on prednisone at that time.  - Amiodarone has been stopped - Seen by pulmonary at [facility] 11/23 - Evaluated by Pulm at [facility] during [place] recent admit did not feel he had amio toxicity - On dexamethasone taper 4. ESRD: HD per nephrology, on T,Th,Sat schedule.   - Significantly volume overloaded -Recommend Nephrology consult for iHD today 5. Obesity: Body mass index is 57 - wt loss advised 6. OSA: Continue CPAP QHS 7. Lower extremity wounds: - Per primary  -  Wound consulted 8. Thrombocytopenia/anemia: - Hgb 12.7>7.9, Plt 357>115, ? sample - recheck CBC - On eliquis  Length of Stay: 0  Freada Bergeron, MD  08/06/2022, 8:33 PM  Advanced Heart Failure Team Pager [phone] (M-F; 7a - 5p)  Please contact [facility] for night-coverage after hours (5p -7a ) and weekends on [email or web address]   Patient seen with PA, agree with the above note .  He was just discharged after a month admission to [facility].  He had a flutter ablation there and is in NSR today.  He is markedly volume overloaded and has been short of breath.  He blames this on prednisone which he had been on for possible amiodarone lung toxicity.   He is seen today on HD.    General: NAD Neck: JVP 16 cm, no thyromegaly or thyroid nodule.  Lungs: Clear to auscultation bilaterally with normal respiratory effort. CV: Nonpalpable PMI.  Heart regular S1/S2, no S3/S4, no murmur.  2+ edema to thighs. No carotid bruit.  Normal pedal pulses.  Abdomen: Soft, nontender, no hepatosplenomegaly, no distention.  Skin: Intact without lesions or rashes.  Neurologic: Alert and oriented x 3.  Psych: Normal affect. Extremities: Chronic venous stasis  changes.  HEENT: Normal.   End stage cardiomyopathy with ESRD.  He is markedly volume overloaded.  BP currently stable.  The only way to help him here is going to be aggressive diuresis.  He needs dialysis daily or should consider CVVH for faster volume removal. He was on CVVH during recent admission to [facility].  He is very resistant to aggressive diuresis, says he has swelling from prednisone, not heart failure, and thinks pulling fluid is not helpful.  I am not sure how to disavow him of this.   He is in NSR currently after recent flutter ablation.  Avoiding amiodarone with concern for prior lung toxicity.  He does not have any antiarrhythmic options.  Unfortunately, high risk for going into atrial fibrillation (has been in AF in the past as well) .  Freada Bergeron 08/06/2022 8:33 PM

## 2022-08-06 NOTE — ED Notes (Signed)
Patient transported to dialysis. Primary RN made aware.

## 2022-08-06 NOTE — Progress Notes (Signed)
Pt receives out-pt HD at [facility]) on TTS. Pt has a 6:50 am chair time. Will assist as needed.   Melven Sartorius Renal Navigator [phone]

## 2022-08-06 NOTE — Consult Note (Addendum)
[facility] Nurse Consult Note: Reason for Consult: Consult requested for bilat legs.  Pt states he was wearing compression stockings prior to admission to reduce edema. Wound type: Bilat legs with generalized edema and erythremia.   Left outer ankle with patchy areas of full thickness wounds; approx 7X7X.2cm, 50% red, 50% yellow, mod amt yellow drainage Right outer ankle with patchy areas of full thickness wounds; approx 8X8X.2cm, 50% red, 50% yellow, mod amt yellow drainage Pt states he has a history of Hidradenitis Suppurativa (HS) and has multiple dry scabbed areas to his body.  Right buttock with partial thickness lesion related to HS; .5X.5X.1cm, red and moist with small amt yellow drainage.  This is NOT a pressure injury.   Foam dressing to protect from further injury and absorb drainage. He also has a scar in a "valley" to his sacrum which is grey and moist; he states he previously had surgery for a pilonidal cyst to this location. Dressing procedure/placement/frequency: Topical treatment orders provided for bedside nurses to perform as follows to promote healing and reduce edema: Apply Xeroform gauze to left and right outer ankle wounds Q day, then cover with ABD pads and kerlex, then ace wrap. Please re-consult if further assistance is needed.  Thank-you,  Julien Girt MSN, [place], [place], [place], [place]

## 2022-08-06 NOTE — Progress Notes (Addendum)
Advanced Heart Failure Rounding Note  PCP-Cardiologist: [name], MD    Patient Profile   37 y.o. 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  Multiple recent admissions to [facility] and [facility] with acute on chronic hypoxic respiratory failure and volume overload + amiodarone induced lung toxicity for which he was treated with steroids.  Attempted AF ablation at [facility] 06/30/22. Discharged in [place].  Most recently admitted to [facility] for a month (discharge 01/10) with volume overload requiring CRRT and recurrent AF with RVR. Apparently started on dronedadrone. Refused much of care at end of stay and presenting to [facility] 01/11 with volume overload and LE wounds.  Admitted overnight with volume overload and LE wounds.   Subjective:    Seen in ED this am.   Complaining of lower extremity pain especially at site of wounds.   He does not feel that he needs HD.       Objective:   Weight Range: (!) 193.4 kg Body mass index is 57.83 kg/m.   Vital Signs:   Temp:  [98.6 F (37 C)-99.9 F (37.7 C)] 98.6 F (37 C) (01/12 0800) Pulse Rate:  [104-115] 106 (01/12 0800) Resp:  [16-25] 16 (01/12 0800) BP: (109-146)/(54-123) 110/93 (01/12 0800) SpO2:  [90 %-100 %] 90 % (01/12 0800) FiO2 (%):  [40 %] 40 % (01/11 2355) Weight:  [193.4 kg] 193.4 kg (01/11 2159)    Weight change: Filed Weights   08/05/22 2159  Weight: (!) 193.4 kg    Intake/Output:  No intake or output data in the 24 hours ending 08/06/22 1000    Physical Exam    General:  Chronically ill appearing. HEENT: Normal Neck: Supple. JVP difficult d/t neck size. Carotids 2+ bilat; no bruits.  Cor: PMI nondisplaced. Regular rate & rhythm, tachy. No rubs, gallops or murmurs. Lungs: diminished Abdomen: obese, soft, nontender, + distended. No  hepatosplenomegaly. No bruits or masses. Good bowel sounds. Extremities: 4+ lower extremity edema to waist, multiple wheeping wounds on lower extremities Neuro: Alert & orientedx3, cranial nerves grossly intact. Affect pleasant   Telemetry   Sinus tach 100s  EKG    ST 111 bpm  Labs    CBC Recent Labs    08/05/22 1720  WBC 8.8  HGB 12.7*  HCT 39.3  MCV 87.9  PLT 768   Basic Metabolic Panel Recent Labs    08/05/22 1720 08/06/22 0534  NA 132* 132*  K 4.1 4.2  CL 96* 95*  CO2 21* 23  GLUCOSE 74 114*  BUN 25* 32*  CREATININE 4.53* 5.39*  CALCIUM 9.0 8.9   Liver Function Tests Recent Labs    08/05/22 1720  AST 26  ALT 23  ALKPHOS 137*  BILITOT 0.5  PROT 7.5  ALBUMIN 2.9*   No results for input(s): "LIPASE", "AMYLASE" in the last 72 hours. Cardiac Enzymes No results for input(s): "CKTOTAL", "CKMB", "CKMBINDEX", "TROPONINI" in the last 72 hours.  BNP: BNP (last 3 results) Recent Labs    03/26/22 1958 05/22/22 0732 08/05/22 1720  BNP 374.2* 373.1* 15.5    ProBNP (last 3 results) No results for input(s): "PROBNP" in the last 8760 hours.   D-Dimer No results for input(s): "DDIMER" in the last 72 hours. Hemoglobin A1C No results for input(s): "HGBA1C" in the last 72 hours. Fasting Lipid Panel No results for input(s): "CHOL", "  HDL", "LDLCALC", "TRIG", "CHOLHDL", "LDLDIRECT" in the last 72 hours. Thyroid Function Tests No results for input(s): "TSH", "T4TOTAL", "T3FREE", "THYROIDAB" in the last 72 hours.  Invalid input(s): "FREET3"  Other results:   Imaging    DG Chest 2 View  Result Date: 08/05/2022 CLINICAL DATA:  Shortness of breath EXAM: CHEST - 2 VIEW COMPARISON:  Chest x-ray 07/08/2022.  Chest CT 06/30/2022. FINDINGS: Heart is enlarged, unchanged. There central pulmonary vascular congestion. Right-sided central venous catheter tip projects over the distal SVC. There is no lung consolidation, pleural effusion or pneumothorax. No acute  fractures are seen. IMPRESSION: Cardiomegaly and central pulmonary vascular congestion. Electronically Signed   By: Ronney Asters M.D.   On: 08/05/2022 18:54     Medications:     Scheduled Medications:  apixaban  5 mg Oral BID   doxycycline  100 mg Oral Q12H   ferrous sulfate  325 mg Oral Daily   fluticasone furoate-vilanterol  1 puff Inhalation Daily   lanthanum  2,000 mg Oral TID WC   melatonin  5 mg Oral QHS    Infusions:  furosemide      PRN Medications: acetaminophen **OR** acetaminophen, HYDROmorphone     Assessment/Plan   1.  Chronic systolic CHF: Nonischemic CMP, LHC 2021 with no CAD. Previous hospitalizations w/shock in setting of rapid AFib/Flutter requiring milrinone. Most recent echo 8/23 with EF 20-25%, RV mildly reduced.  - ESRD, volume controlled by HD.  Multiple recent hospitalizations for volume overload. Required CRRT during most recent admit to [facility] d/t difficult to manage volume. Has hx of missing outpatient HD sessions and refusing care.  - He is significantly volume overloaded. Steroids may be contributing. Recommend Nephrology consult for HD, he does not feel he needs HD at this time. - BP currently stable but may need to resume home midodrine 5 mg TID for him to tolerate HD - GDMT limited by CKD and hypotension requiring midodrine  - Not candidate for ICD given obesity and dialysis  - Not candidate for advanced therapies. [place] discussions would be appropriate. 2. Atrial flutter/fibrillation: History of multiple DCCVs. Amiodarone previously stopped due to elevated LFTs. Restarted 05/20/22 by Dr. Curt Bears.  Concern for possible amiodarone pulmonary toxicity w/ elevated ESR and CRP 11/23. He was seen by Pulmonary last admit (see below).  - Ablation at [facility] 12/23 - Maintaining SR  - Not candidate for amiodarone (lung and hepatic toxicity) or Tikosyn (ESRD), no anti-arrhythmic option.   - Would avoid dronedadrone with hx CHF. Apparently was on during recent  admit, but not on discharge med list. - Continue Eliquis 5 mg bid  3. Possible amiodarone toxicity: Seen by pulmonary during admit 11/23. Had elevated ESR and sed rate. Started on prednisone at that time.  - Amiodarone has been stopped - Seen by pulmonary at [facility] 11/23 - Evaluated by Pulm at [facility] during [place] recent admit did not feel he had amio toxicity - On dexamethasone taper 4. ESRD: HD per nephrology, on T,Th,Sat schedule.   - Significantly volume overloaded -Recommend Nephrology consult for iHD today 5. Obesity: Body mass index is 57 - wt loss advised 6. OSA: Continue CPAP QHS 7. Lower extremity wounds: - Per primary  - Wound consulted 8. Thrombocytopenia/anemia: - Hgb 12.7>7.9, Plt 357>115, ? sample - recheck CBC - On eliquis  Length of Stay: 0  FINCH, LINDSAY N, PA-C  08/06/2022, 10:00 AM  Advanced Heart Failure Team Pager [phone] (M-F; 7a - 5p)  Please contact [facility] for night-coverage after hours (  5p -7a ) and weekends on [email or web address]   Patient seen with PA, agree with the above note .  He was just discharged after a month admission to [facility].  He had a flutter ablation there and is in NSR today.  He is markedly volume overloaded and has been short of breath.  He blames this on prednisone which he had been on for possible amiodarone lung toxicity.   He is seen today on HD.    General: NAD Neck: JVP 16 cm, no thyromegaly or thyroid nodule.  Lungs: Clear to auscultation bilaterally with normal respiratory effort. CV: Nonpalpable PMI.  Heart regular S1/S2, no S3/S4, no murmur.  2+ edema to thighs. No carotid bruit.  Normal pedal pulses.  Abdomen: Soft, nontender, no hepatosplenomegaly, no distention.  Skin: Intact without lesions or rashes.  Neurologic: Alert and oriented x 3.  Psych: Normal affect. Extremities: Chronic venous stasis changes.  HEENT: Normal.   End stage cardiomyopathy with ESRD.  He is markedly volume overloaded.  BP currently stable.  The only  way to help him here is going to be aggressive diuresis.  He needs dialysis daily or should consider CVVH for faster volume removal. He was on CVVH during recent admission to [facility].  He is very resistant to aggressive diuresis, says he has swelling from prednisone, not heart failure, and thinks pulling fluid is not helpful.  I am not sure how to disavow him of this.   He is in NSR currently after recent flutter ablation.  Avoiding amiodarone with concern for prior lung toxicity.  He does not have any antiarrhythmic options.  Unfortunately, high risk for going into atrial fibrillation (has been in AF in the past as well) .  [name] 08/06/2022 5:23 PM

## 2022-08-06 NOTE — Consult Note (Signed)
Cardiology Consultation:   Patient ID: Phillip Young MRN: [id number]; DOB: 06/09/1986  Admit date: 08/05/2022 Date of Consult: 08/06/2022  Primary Care Provider: [name], [facility] Cardiologist: [name], MD  [place] Electrophysiologist:  Will Meredith Leeds, MD    Patient Profile:   Phillip Young is a 37 y.o. male with a hx of morbid obesity (BMI55-60), OSA, OHVS, HFrEF, pAF/AFL (s/p multiple prior DCCV attempts, attempted ablation in December 2023), concern amiodarone lung toxicity (currently 4-6L home O2), ESRD (Tues/Thurs/Sat iHD) who is being seen today for the evaluation of volume overload at the request of Hospital Medicine.  History of Present Illness:   Phillip Young has had multiple recent hospitalizations for shortness of breath, acute on chronic hypoxic respiratory failure, and difficult to manage volume overload. A brief overview of these recent hospitalizations is outlined below:   10/28-11/16: Prolonged hospital course with decompensated heart failure and AF/FL with RVR. Amiodarone induced lung toxicity was diagnosed during this admission. Started on prednisone. Volume status was managed with iHD throughout hospitalization and oxygen requirements improved. Discharged on 4L home O2 at rest. EDW 175-180kg.   11/27 - 12/7: Admitted to [facility] with ADHF, again requiring regular iHD sessions for volume removal. IV diuresis inefefctive with limited UOP. Net negative 24L during this hospital course. Extended pred taper continued for amiodarone lung toxicity. During this hospital course, EP attempted ablation for AF on 12/6. Patient was NSR at time of discharge.   12/12 - 1/10: Readmitted to [facility] for prolonged hospital course. Difficulty with volume removal during this admission due to patient electing to terminate treatment sessions early. Required escalation to ICU care from 12/12-12/14 for CRRT. Course was complicated by  recurrent AFRVR with HR 170-190s requiring readmission to ICU. Started on dronedarone with improvement in rates.   Today, shortly after discharge from outside hospital where it is documented that he was refusing much of his care at the end of his stay, he presents again for worsening BiL lower extremity swelling and pain with weeping lesions involving both lower extremities. His weight is approximately 10kg up from baseline (194kg from 184kg).      Past Medical History:  Diagnosis Date   Acute on chronic respiratory failure with hypoxia ([place]) 04/21/2021   Acute on chronic systolic (congestive) heart failure ([name]) 02/26/2020   Biventricular congestive heart failure ([place])    Last Echo 11/2019 at [facility] reveals EF 20%   Class 3 severe obesity due to excess calories with serious comorbidity and body mass index (BMI) of 50.0 to 59.9 in adult [facility]) 02/26/2020   Essential hypertension 02/26/2020   GERD without esophagitis 02/26/2020   Hidradenitis suppurativa 02/26/2020   OSA (obstructive sleep apnea) 02/26/2020   Prediabetes 02/26/2020   Renal disorder     Past Surgical History:  Procedure Laterality Date   ABSCESS DRAINAGE     AV FISTULA PLACEMENT Left 08/21/2021   Procedure: LEFT ARM ARTERIOVENOUS (AV) FISTULA.;  Surgeon: Serafina Mitchell, MD;  Location: [facility];  Service: Vascular;  Laterality: Left;   CARDIOVERSION N/A 10/09/2021   Procedure: CARDIOVERSION;  Surgeon: Larey Dresser, MD;  Location: [facility] ENDOSCOPY;  Service: Cardiovascular;  Laterality: N/A;   CARDIOVERSION N/A 05/28/2022   Procedure: CARDIOVERSION;  Surgeon: Larey Dresser, MD;  Location: [facility] ENDOSCOPY;  Service: Cardiovascular;  Laterality: N/A;   CARDIOVERSION N/A 06/07/2022   Procedure: CARDIOVERSION;  Surgeon: Larey Dresser, MD;  Location: [place];  Service: Cardiovascular;  Laterality: N/A;  IR FLUORO GUIDE CV LINE RIGHT  03/10/2021   IR FLUORO GUIDE CV LINE RIGHT  04/22/2021   IR FLUORO GUIDE CV LINE RIGHT   08/20/2021   IR US GUIDE VASC ACCESS RIGHT  03/10/2021   IR US GUIDE VASC ACCESS RIGHT  04/22/2021   RIGHT HEART CATH N/A 03/06/2021   Procedure: RIGHT HEART CATH;  Surgeon: Jolaine Artist, MD;  Location: [place] CV LAB;  Service: Cardiovascular;  Laterality: N/A;   RIGHT/LEFT HEART CATH AND CORONARY ANGIOGRAPHY N/A 03/04/2020   Procedure: RIGHT/LEFT HEART CATH AND CORONARY ANGIOGRAPHY;  Surgeon: Larey Dresser, MD;  Location: [place] CV LAB;  Service: Cardiovascular;  Laterality: N/A;   TEE WITHOUT CARDIOVERSION N/A 05/05/2021   Procedure: TRANSESOPHAGEAL ECHOCARDIOGRAM (TEE);  Surgeon: Larey Dresser, MD;  Location: [facility] ENDOSCOPY;  Service: Cardiovascular;  Laterality: N/A;   TEMPORARY DIALYSIS CATHETER  03/06/2021   Procedure: TEMPORARY DIALYSIS CATHETER;  Surgeon: Jolaine Artist, MD;  Location: [place] CV LAB;  Service: Cardiovascular;;     Home Medications:  Prior to Admission medications   Medication Sig Start Date End Date Taking? Authorizing Provider  apixaban (ELIQUIS) 5 MG TABS tablet Take 1 tablet (5 mg total) by mouth 2 (two) times daily. 12/16/21   Larey Dresser, MD  diclofenac Sodium (VOLTAREN) 1 % GEL Apply 4 g topically 3 (three) times daily as needed (joint pains). 03/28/22   Lacinda Axon, MD  ferrous sulfate 325 (65 FE) MG tablet Take 1 tablet (325 mg total) by mouth daily. 06/10/22 06/10/23  Serita Butcher, MD  fluticasone furoate-vilanterol (BREO ELLIPTA) 100-25 MCG/ACT AEPB Inhale 1 puff into the lungs daily. 06/11/22   Serita Butcher, MD  lanthanum (FOSRENOL) 1000 MG chewable tablet Chew 2,000 mg by mouth 3 (three) times daily with meals.    [provider]  melatonin 5 MG TABS Take 1 tablet (5 mg total) by mouth at bedtime. 06/10/22   Serita Butcher, MD  Menthol (HALLS COUGH DROPS) 5.8 MG LOZG Use as directed 1 lozenge in the mouth or throat every 4 (four) hours as needed (cough).    [provider]  metoprolol succinate  (TOPROL-XL) 25 MG 24 hr tablet TAKE 1 TABLET(25 MG) BY MOUTH TWICE DAILY 06/10/22   Katsadouros, Vasilios, MD  midodrine (PROAMATINE) 5 MG tablet TAKE 1 TABLET(5 MG) BY MOUTH THREE TIMES DAILY WITH MEALS Patient taking differently: Take 5 mg by mouth 3 (three) times daily with meals. 06/10/22   Katsadouros, Vasilios, MD  torsemide (DEMADEX) 100 MG tablet TAKE 1 TABLET BY MOUTH 3 TIMES A WEEK ON NON-DIALYSIS DAYS. 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, SRA positive   Amiodarone Other (See Comments)    Suspicion for amiodarone lung/hepatotoxicity    Social History:   Social History   Socioeconomic History   Marital status: Single    Spouse name: Not on file  Number of children: Not on file   Years of education: Not on file   Highest education level: Not on file  Occupational History   Not on file  Tobacco Use   Smoking status: Former    Packs/day: 1.00    Types: Cigarettes    Quit date: 2019    Years since quitting: 5.0    Passive exposure: Current   Smokeless tobacco: Former   Tobacco comments:    quit in 2019  Vaping Use   Vaping Use: Never used  Substance and Sexual Activity   Alcohol use: Never   Drug use: Never   Sexual activity: Not on file  Other Topics Concern   Not on file  Social History Narrative   Not on file   Social Determinants of Health   Financial Resource Strain: Low Risk  (04/13/2021)   Overall Financial Resource Strain  (CARDIA)    Difficulty of Paying Living Expenses: Not very hard  Food Insecurity: No Food Insecurity (05/23/2022)   Hunger Vital Sign    Worried About Running Out of Food in the Last Year: Never true    [place] in the Last Year: Never true  Transportation Needs: No Transportation Needs (05/23/2022)   PRAPARE - Hydrologist (Medical): No    Lack of Transportation (Non-Medical): No  Physical Activity: Not on file  Stress: Not on file  Social Connections: Not on file  Intimate Partner Violence: Not At Risk (05/23/2022)   Humiliation, Afraid, Rape, and Kick questionnaire    Fear of Current or Ex-Partner: No    Emotionally Abused: No    Physically Abused: No    Sexually Abused: No    Family History:   Family History  Problem Relation Age of Onset   Heart disease Mother    Hypertension Mother    Pulmonary Hypertension Mother    Drug abuse Father        died due to Heroin overdose      Physical Exam/Data:   Vitals:   08/06/22 0330 08/06/22 0445 08/06/22 0500 08/06/22 0515  BP: 124/74     Pulse:      Resp:  17 20 19   Temp:      TempSrc:      SpO2:      Weight:      Height:       No intake or output data in the 24 hours ending 08/06/22 0528    08/05/2022    9:59 PM 08/05/2022    3:25 PM 06/10/2022   10:08 AM  Last 3 Weights  Weight (lbs) 426 lb 5.9 oz 426 lb 6.4 oz 410 lb 7.9 oz  Weight (kg) 193.4 kg 193.414 kg 186.2 kg     Body mass index is 57.83 kg/m.  General:  Tired appearing, nasal cannula in place. NAD.  Neck: JVD is present Cardiac:  tachycardic S1, S2; regular Lungs:  clear to auscultation bilaterally, no wheezing, rhonchi or rales  Abd: soft, nontender, no hepatomegaly  Ext: 3+ BiL lower extremity edema with oozing from bilateral leg wounds  EKG:  The EKG was personally reviewed and demonstrates:  sinus tachycardia, biatrial enlargement, no acute ischemic changes.    Laboratory Data:  High Sensitivity Troponin:    Recent Labs  Lab 08/05/22 1720 08/05/22 2032  TROPONINIHS 14 51*     Chemistry Recent Labs  Lab 08/05/22 1720  NA 132*  K 4.1  CL 96*  CO2 21*  GLUCOSE 74  BUN 25*  CREATININE 4.53*  CALCIUM 9.0  GFRNONAA 16*  ANIONGAP 15    Recent Labs  Lab 08/05/22 1720  PROT 7.5  ALBUMIN 2.9*  AST 26  ALT 23  ALKPHOS 137*  BILITOT 0.5   Hematology Recent Labs  Lab 08/05/22 1720  WBC 8.8  RBC 4.47  HGB 12.7*  HCT 39.3  MCV 87.9  MCH 28.4  MCHC 32.3  RDW 12.9  PLT 357   BNP Recent Labs  Lab 08/05/22 1720  BNP 15.5    DDimer No results for input(s): "DDIMER" in the last 168 hours.  Radiology/Studies:  DG Chest 2 View  Result Date: 08/05/2022 CLINICAL DATA:  Shortness of breath EXAM: CHEST - 2 VIEW COMPARISON:  Chest x-ray 07/08/2022.  Chest CT 06/30/2022. FINDINGS: Heart is enlarged, unchanged. There central pulmonary vascular congestion. Right-sided central venous catheter tip projects over the distal SVC. There is no lung consolidation, pleural effusion or pneumothorax. No acute fractures are seen. IMPRESSION: Cardiomegaly and central pulmonary vascular congestion. Electronically Signed   By: Ronney Asters M.D.   On: 08/05/2022 18:54   {  Assessment and Plan:   #Chronic NICM (LVEF 20-25%) As outlined in HPI, patient has had multiple, prolonged recent hospitalizations for acute on chronic hypoxic respiratory failure and volume overload requiring iHD/CRRT (~2 weeks total outside of hospital since October). Chronic history is documented of refusing care and missing outpatient iHD sessions. Now representing to [facility] ED within 24hrs of discharge from [facility] after prolonged hospital course, significantly volume overloaded on examination (~10-15kg up from EDW) with painful, weeping bilateral lower extremity lesions.  - Nephrology consult in AM for urgent iHD; patient is iHD dependent for volume removal and has required daily sessions for volume optimization in  prior hospitalizations.  - 2L free water restriction, low Na diet - Strict I/Os - Wound care consultation for lower extremity lesions - Ongoing GOC discussions as documented in prior admissions; patient is not a candidate for advanced heart failure therapies.   #Paroxysmal Atrial Fibrillation #Atrial Flutter Chronic and difficult to manage history of SVT requiring multiple DCCVs in past as well as ablation in December 2023. Concern for amiodarone lung toxicity has been previously raised, however high resolution CT imaging was thought to be inconsistent with amiodarone toxicity.  - Continue apixaban 5mg  q12h - Continue home Toprol     For questions or updates, please contact [place] Please consult [email or web address] for contact info under    Signed, Delorse Limber, MD  08/06/2022 5:28 AM

## 2022-08-06 NOTE — Progress Notes (Signed)
   08/06/22 1736  Vitals  Temp 98.3 F (36.8 C)  Temp Source Oral  BP (!) 100/52  MAP (mmHg) 66  BP Location Left Wrist  BP Method Automatic  Patient Position (if appropriate) Lying  Pulse Rate (!) 104  Pulse Rate Source Monitor  ECG Heart Rate (!) 105  Resp 16  Oxygen Therapy  SpO2 100 %  O2 Device Nasal Cannula  O2 Flow Rate (L/min) 6 L/min  Pulse Oximetry Type Continuous   Received patient in bed to unit.  Alert and oriented.  Informed consent signed and in chart.   Treatment initiated: 1525 Treatment completed: 1727  Patient tolerated well.  Transported back to the room  Alert, without acute distress.  Hand-off given to patient's nurse.   Access used: HD cath Access issues: NA  Total UF removed: 2651ml Medication(s) given: Na+ Citrate Dwells 4.60ml Post HD VS: see above Post HD weight: UTA d/t pt on ED stretcher and not stable enough w/ breathing to stand and weigh   [facility]

## 2022-08-06 NOTE — Procedures (Signed)
   I was present at this dialysis session, have reviewed the session itself and made  appropriate changes Kelly Splinter MD [place] pager [phone]   08/06/2022, 4:18 PM

## 2022-08-06 NOTE — Progress Notes (Signed)
Dr, Jodi Mourning paged that patient is requesting Oxycodone instead of dilaudid

## 2022-08-07 DIAGNOSIS — Z87891 Personal history of nicotine dependence: Secondary | ICD-10-CM | POA: Diagnosis not present

## 2022-08-07 DIAGNOSIS — I480 Paroxysmal atrial fibrillation: Secondary | ICD-10-CM | POA: Diagnosis present

## 2022-08-07 DIAGNOSIS — D696 Thrombocytopenia, unspecified: Secondary | ICD-10-CM | POA: Diagnosis not present

## 2022-08-07 DIAGNOSIS — I340 Nonrheumatic mitral (valve) insufficiency: Secondary | ICD-10-CM | POA: Diagnosis present

## 2022-08-07 DIAGNOSIS — Z6841 Body Mass Index (BMI) 40.0 and over, adult: Secondary | ICD-10-CM | POA: Diagnosis not present

## 2022-08-07 DIAGNOSIS — J9621 Acute and chronic respiratory failure with hypoxia: Secondary | ICD-10-CM | POA: Diagnosis not present

## 2022-08-07 DIAGNOSIS — Z1152 Encounter for screening for COVID-19: Secondary | ICD-10-CM | POA: Diagnosis not present

## 2022-08-07 DIAGNOSIS — I959 Hypotension, unspecified: Secondary | ICD-10-CM | POA: Diagnosis not present

## 2022-08-07 DIAGNOSIS — G4733 Obstructive sleep apnea (adult) (pediatric): Secondary | ICD-10-CM | POA: Diagnosis not present

## 2022-08-07 DIAGNOSIS — T380X5A Adverse effect of glucocorticoids and synthetic analogues, initial encounter: Secondary | ICD-10-CM | POA: Diagnosis present

## 2022-08-07 DIAGNOSIS — I428 Other cardiomyopathies: Secondary | ICD-10-CM | POA: Diagnosis not present

## 2022-08-07 DIAGNOSIS — I132 Hypertensive heart and chronic kidney disease with heart failure and with stage 5 chronic kidney disease, or end stage renal disease: Secondary | ICD-10-CM | POA: Diagnosis not present

## 2022-08-07 DIAGNOSIS — I5082 Biventricular heart failure: Secondary | ICD-10-CM | POA: Diagnosis present

## 2022-08-07 DIAGNOSIS — Z992 Dependence on renal dialysis: Secondary | ICD-10-CM | POA: Diagnosis not present

## 2022-08-07 DIAGNOSIS — I471 Supraventricular tachycardia, unspecified: Secondary | ICD-10-CM | POA: Diagnosis present

## 2022-08-07 DIAGNOSIS — N186 End stage renal disease: Secondary | ICD-10-CM | POA: Diagnosis not present

## 2022-08-07 DIAGNOSIS — I4892 Unspecified atrial flutter: Secondary | ICD-10-CM | POA: Diagnosis not present

## 2022-08-07 DIAGNOSIS — D509 Iron deficiency anemia, unspecified: Secondary | ICD-10-CM | POA: Diagnosis present

## 2022-08-07 DIAGNOSIS — E8779 Other fluid overload: Secondary | ICD-10-CM | POA: Diagnosis not present

## 2022-08-07 DIAGNOSIS — I878 Other specified disorders of veins: Secondary | ICD-10-CM | POA: Diagnosis present

## 2022-08-07 DIAGNOSIS — I5043 Acute on chronic combined systolic (congestive) and diastolic (congestive) heart failure: Secondary | ICD-10-CM | POA: Diagnosis not present

## 2022-08-07 DIAGNOSIS — I5030 Unspecified diastolic (congestive) heart failure: Secondary | ICD-10-CM | POA: Diagnosis not present

## 2022-08-07 DIAGNOSIS — Z7951 Long term (current) use of inhaled steroids: Secondary | ICD-10-CM | POA: Diagnosis not present

## 2022-08-07 DIAGNOSIS — E662 Morbid (severe) obesity with alveolar hypoventilation: Secondary | ICD-10-CM | POA: Diagnosis present

## 2022-08-07 DIAGNOSIS — D631 Anemia in chronic kidney disease: Secondary | ICD-10-CM | POA: Diagnosis not present

## 2022-08-07 DIAGNOSIS — G4700 Insomnia, unspecified: Secondary | ICD-10-CM | POA: Diagnosis present

## 2022-08-07 LAB — RENAL FUNCTION PANEL
Albumin: 2.4 g/dL — ABNORMAL LOW (ref 3.5–5.0)
Anion gap: 12 (ref 5–15)
BUN: 27 mg/dL — ABNORMAL HIGH (ref 6–20)
CO2: 23 mmol/L (ref 22–32)
Calcium: 8.9 mg/dL (ref 8.9–10.3)
Chloride: 95 mmol/L — ABNORMAL LOW (ref 98–111)
Creatinine, Ser: 5.44 mg/dL — ABNORMAL HIGH (ref 0.61–1.24)
GFR, Estimated: 13 mL/min — ABNORMAL LOW (ref >60)
Glucose, Bld: 106 mg/dL — ABNORMAL HIGH (ref 70–99)
Phosphorus: 3.9 mg/dL (ref 2.5–4.6)
Potassium: 4.4 mmol/L (ref 3.5–5.1)
Sodium: 130 mmol/L — ABNORMAL LOW (ref 135–145)

## 2022-08-07 LAB — DIFFERENTIAL
Abs Immature Granulocytes: 0.04 10*3/uL (ref 0.00–0.07)
Basophils Absolute: 0 10*3/uL (ref 0.0–0.1)
Basophils Relative: 1 %
Eosinophils Absolute: 0.3 10*3/uL (ref 0.0–0.5)
Eosinophils Relative: 3 %
Immature Granulocytes: 1 %
Lymphocytes Relative: 15 %
Lymphs Abs: 1.1 10*3/uL (ref 0.7–4.0)
Monocytes Absolute: 0.8 10*3/uL (ref 0.1–1.0)
Monocytes Relative: 11 %
Neutro Abs: 5.2 10*3/uL (ref 1.7–7.7)
Neutrophils Relative %: 69 %

## 2022-08-07 LAB — CBC
HCT: 21.2 % — ABNORMAL LOW (ref 39.0–52.0)
Hemoglobin: 6.6 g/dL — CL (ref 13.0–17.0)
MCH: 33.5 pg (ref 26.0–34.0)
MCHC: 31.1 g/dL (ref 30.0–36.0)
MCV: 107.6 fL — ABNORMAL HIGH (ref 80.0–100.0)
Platelets: 113 10*3/uL — ABNORMAL LOW (ref 150–400)
RBC: 1.97 MIL/uL — ABNORMAL LOW (ref 4.22–5.81)
RDW: 15.8 % — ABNORMAL HIGH (ref 11.5–15.5)
WBC: 7.4 10*3/uL (ref 4.0–10.5)
nRBC: 0 % (ref 0.0–0.2)

## 2022-08-07 LAB — TYPE AND SCREEN
ABO/RH(D): B POS
Antibody Screen: NEGATIVE

## 2022-08-07 LAB — TECHNOLOGIST SMEAR REVIEW: Plt Morphology: DECREASED

## 2022-08-07 LAB — HEPATITIS B SURFACE ANTIBODY, QUANTITATIVE: Hep B S AB Quant (Post): 124.6 m[IU]/mL (ref >9.9)

## 2022-08-07 LAB — GLUCOSE, CAPILLARY: Glucose-Capillary: 117 mg/dL — ABNORMAL HIGH (ref 70–99)

## 2022-08-07 MED ORDER — SALINE SPRAY 0.65 % NA SOLN
1.0000 | NASAL | Status: DC | PRN
  Administered 2022-08-07: 1 via NASAL
  Filled 2022-08-07: qty 44

## 2022-08-07 MED ORDER — ANTICOAGULANT SODIUM CITRATE 4% (200MG/5ML) IV SOLN
5.0000 mL | Freq: Once | Status: AC
  Administered 2022-08-07: 5 mL
  Filled 2022-08-07: qty 5

## 2022-08-07 MED ORDER — ORAL CARE MOUTH RINSE: 15.0000 mL | OROMUCOSAL | Status: DC | PRN

## 2022-08-07 NOTE — Progress Notes (Signed)
Critical HGB 6.6 MD Dr. Doug Sou notified internal medicine

## 2022-08-07 NOTE — Progress Notes (Addendum)
Received patient before his treatment,awake ,alert and oriented x 4 . Vitals stable .  Access used Right HD catheter that works well.Dressing on date.  Duration of treatment 3.75 hours.  Medicine given :Hectorol 6 mcg.  Fluid removed : 3,600 cc  Hemodialysis treatment issue. Unmet  max prescribed UF goal due to his blood pressure were dropping during the last 45 minutes of his treatment.Patient refused blood transfusion.  Hand off to the patient's nurse.

## 2022-08-07 NOTE — Progress Notes (Signed)
NT re[ported that patient id picking his nose trying to make it bleed.

## 2022-08-07 NOTE — Progress Notes (Signed)
Informed patient that his hgb is low .Renal P.A at the bedside ,when informed that he needs blood transfusion ,patient refused.

## 2022-08-07 NOTE — Progress Notes (Signed)
Patient has been taking his nasal cannula off and STAT drop to 51' inform patient that he needs to keep his oxygen on  increased O2 to 7 liters RT was also called and informed

## 2022-08-07 NOTE — Progress Notes (Signed)
   08/07/22 0806  Provider Notification  Time of Provider Response [phone]    Internal resident team will review. Patient did express hesitation to receive blood so made team aware he will need a conversation about consent prior to transfusion if agreeable

## 2022-08-07 NOTE — Plan of Care (Signed)
  Problem: Education: Goal: Knowledge of General Education information will improve Description: Including pain rating scale, medication(s)/side effects and non-pharmacologic comfort measures Outcome: Progressing   Problem: Health Behavior/Discharge Planning: Goal: Ability to manage health-related needs will improve Outcome: Progressing   Problem: Clinical Measurements: Goal: Ability to maintain clinical measurements within normal limits will improve Outcome: Progressing Goal: Will remain free from infection Outcome: Progressing Goal: Respiratory complications will improve Outcome: Progressing Goal: Cardiovascular complication will be avoided Outcome: Progressing   Problem: Activity: Goal: Risk for activity intolerance will decrease Outcome: Progressing   Problem: Education: Goal: Ability to demonstrate management of disease process will improve Outcome: Progressing Goal: Ability to verbalize understanding of medication therapies will improve Outcome: Progressing

## 2022-08-07 NOTE — Progress Notes (Signed)
HD#0 Subjective:  Overnight Events: Oxygen saturation dropped to 80's. Patient refused Bipap. He had pain in lower extremities.  Patient is at the bedside this morning while in dialysis.  He reports that he is feeling okay and wants to know how much fluid is still on him.  We talked about blood count dropping overnight and him needing blood transfusion.  He is concerned that with the other medical recommendations that have been made such as amiodarone and steroids, that have had complications that receiving blood would present with another complication.  He is concerned about who is giving blood and the blood being inside of him.  I talked about concern low blood count is putting extra stress on heart and could tip him into abnormal heart rhythm.  He indicates understanding this and only wants to be transfuse blood if hemoglobin were to drop less than 6.   Pt is updated on the plan for today, and all questions and concerns are addressed.   Objective:  Vital signs in last 24 hours: Vitals:   08/06/22 1727 08/06/22 1736 08/06/22 1936 08/07/22 0328  BP: 113/60 (!) 100/52 (!) 113/53 (!) 106/55  Pulse: (!) 102 (!) 104 (!) 108 (!) 108  Resp: 18 16 20 20   Temp:  98.3 F (36.8 C) 100 F (37.8 C) 98.4 F (36.9 C)  TempSrc:  Oral Oral Oral  SpO2: 100% 100% 100% 94%  Weight:    (!) 190.4 kg  Height:       Supplemental O2: Nasal Cannula SpO2: 94 % O2 Flow Rate (L/min): 6 L/min FiO2 (%): 40 %   Physical Exam:  Constitutional: Chronically ill-appearing Neck: HD line in right IJ Cardiovascular: regular rate and rhythm, no m/r/g Pulmonary/Chest: normal work of breathing on 6L [place], exam limited to anterior lungs, diffuse crackles appreciated bilaterally Abdominal: soft, non-tender, non-distended Neurological:  Skin: 3+ pitting edema to mid thigh, wounds present to ankles bilaterally, no erythema or warmth  [facility] Weights   08/05/22 2159 08/07/22 0328  Weight: (!) 193.4 kg (!) 190.4 kg      Intake/Output Summary (Last 24 hours) at 08/07/2022 0555 Last data filed at 08/07/2022 0400 Gross per 24 hour  Intake 1680 ml  Output 2800 ml  Net -1120 ml   Net IO Since Admission: -1,120 mL [08/07/22 0555]  Pertinent Labs:    Latest Ref Rng & Units 08/06/2022    6:36 PM 08/06/2022   10:30 AM 08/05/2022    5:20 PM  CBC  WBC 4.0 - 10.5 K/uL 7.5  7.8  8.8   Hemoglobin 13.0 - 17.0 g/dL 7.2  7.9  12.7   Hematocrit 39.0 - 52.0 % 23.0  24.3  39.3   Platelets 150 - 400 K/uL 127  115  357        Latest Ref Rng & Units 08/07/2022   12:33 AM 08/06/2022    5:34 AM 08/05/2022    5:20 PM  CMP  Glucose 70 - 99 mg/dL 106  114  74   BUN 6 - 20 mg/dL 27  32  25   Creatinine 0.61 - 1.24 mg/dL 5.44  5.39  4.53   Sodium 135 - 145 mmol/L 130  132  132   Potassium 3.5 - 5.1 mmol/L 4.4  4.2  4.1   Chloride 98 - 111 mmol/L 95  95  96   CO2 22 - 32 mmol/L 23  23  21    Calcium 8.9 - 10.3 mg/dL 8.9  8.9  9.0   Total Protein 6.5 - 8.1 g/dL   7.5   Total Bilirubin 0.3 - 1.2 mg/dL   0.5   Alkaline Phos 38 - 126 U/L   137   AST 15 - 41 U/L   26   ALT 0 - 44 U/L   23     Imaging: No results found.  Assessment/Plan:   Active Problems:   OSA (obstructive sleep apnea)   Hidradenitis suppurativa   Paroxysmal atrial flutter ([name])   ESRD (end stage renal disease) on dialysis ([place])   Acute on chronic respiratory failure with hypoxia ([name])   Acute exacerbation of CHF (congestive heart failure) ([name])   Acute on chronic combined systolic and diastolic CHF (congestive heart failure) ([place])   Patient Summary: Phillip Young is a 37 y.o. with a pertinent PMH of obesity, HFrEF with EF 20 to 25%, A-fib-a flutter, ESRD on HD, and OSA, who presented with lower extremity edema and shortness of breath and admitted on 1/11 for acute on chronic heart failure exacerbation on HD#2.   Acute on chronic hypoxic respiratory failure Acute on chronic HFrEF with EF 20 to 25% Patient admitted for repeat heart  failure exacerbation.  This is his third admission in the last 4 months.   [name] does not want to take torsemide or Lasix and has missed dialysis sessions in the past leading to exacerbations.  He is also being treated with steroids for amiodarone toxicity and this likely contributed to present exacerbation.  Heart failure team evaluated him and recommended serial dialysis sessions to remove volume.  2.6 L were removed during HD yesterday.  Per nephrology note there goal is 4.5 L removal today.  Weight is decreasing from 193 to 190 kg.  Urine output of 2.8 L yesterday.  Patient is not interested in taking Lasix or torsemide.  In conversation with patient he seems to have poor insight into heart failure and kidney failure.  Cardiology is not able to offer advanced therapies for him.  I think goals of care should be revisited for him.  -Serial HD sessions -Daily Weights -Strict I and Os -Appreciate nephrology and heart failure team's assistance -Not candidate for ICD due to obesity and dialysis  Anemia of chronic disease Iron deficiency anemia Hemoglobin noted to be 6.6 with baseline hemoglobin of 8.  Ideally would transfuse patient with 1 unit PRBCs.  Multiple providers talked with him about need for blood transfusion.  He is concerned about receiving other people's blood and would only want blood products if hemoglobin were to drop less than 6.  -Daily CBC -Plan to revisit blood transfusion with patient.  He is open to further discussion.  Thrombocytopenia Patient with history of HIT.  Platelets dropped from 300s to 100s in last 2 days.  He score intermediate at 5.  I talked with dialysis nurse practitioner and she states he has not received heparin products here outpatient HD records they use 4% sodium citrate for catheter lock solution.  -Peripheral smear -Daily CBC  Possible amiodarone toxicity He was seen by pulmonology during admission in November.  He was started on prednisone and  amiodarone discontinued.  ESRD He receives HD Tuesday Thursday Saturday.  He was dismissed from prior hospitalization in which she had acute hypoxic respiratory failure secondary to volume overload due to aggressive behavior and ongoing medical/HD nonadherence.  Will need several sessions to remove volume.  Hopefully patient will be open to doing so.  OSA/ OHS - Cpap qhs  Paroxysmal  atrial fibrillation/flutter Patient previously took amiodarone however developed toxicity and was being prescribed steroids for this.  [facility]  He is at high risk of developing A-fib.  -Continue Eliquis 5 mg twice daily -Continue telemetry monitoring  Bilateral malleolus wounds secondary to chronic edema Patient has bilateral wounds to lower extremities.  Skin is not warm and physical exam reflects edema.  -Wound consult -Oxycodone on 5 mg every 6 hours as needed  History of hidradenitis suppurativa Medications include doxycycline 100 mg twice daily.  This was continued during admission.  Diet: Renal IVF: none VTE: NOAC Code: Full PT/OT recs: Home Health, c rollator with O2 attachment. Family Update:   Dispo: Anticipated discharge to  home  in >2 days days pending Volume removal.   Daleen Bo. Ballard Budney, D.O.  Internal Medicine Resident, PGY-2 [facility]  Pager: [phone] 5:55 AM, 08/07/2022   **Please contact the on call pager after 5 pm and on weekends at [phone].**

## 2022-08-07 NOTE — Progress Notes (Signed)
[facility] Progress Note   Subjective:    Seen and examined patient on HD. He ended treatment 1 hour early yesterday d/t being uncomfortable in the bed (per patient). Removed 2.6L yesterday. Tolerating UFG 4.5L this morning. Spoke with Dr. [name] who recommended daily HD while hospitalized. Patient with longstanding history with HD non-compliance so we will do our best. Noted Hgb 6.6 this morning. After education with patient, he is refusing a blood transfusion at this time.   Objective Vitals:   08/07/22 0815 08/07/22 0830 08/07/22 0902 08/07/22 0930  BP:  103/65  (!) 87/58  Pulse:  97 100 (!) 107  Resp: 16   (!) 29  Temp:      TempSrc:      SpO2:  100% 100% 98%  Weight:      Height:       Physical Exam General: Awake, alert, NAD Heart:S1 and S2; No MGRs Lungs: Clear anteriorly bilaterally Abdomen: Obese, soft, and non-tender Extremities: Diffuse anasarca of BL LE's with dressings on lower BL legs (CDI) Dialysis Access: R IJ [facility]   Filed Weights   08/05/22 2159 08/07/22 0328  Weight: (!) 193.4 kg (!) 190.4 kg    Intake/Output Summary (Last 24 hours) at 08/07/2022 0938 Last data filed at 08/07/2022 0400 Gross per 24 hour  Intake 1680 ml  Output 2800 ml  Net -1120 ml    Additional Objective Labs: Basic Metabolic Panel: Recent Labs  Lab 08/05/22 1720 08/06/22 0534 08/06/22 1450 08/07/22 0033  NA 132* 132*  --  130*  K 4.1 4.2  --  4.4  CL 96* 95*  --  95*  CO2 21* 23  --  23  GLUCOSE 74 114*  --  106*  BUN 25* 32*  --  27*  CREATININE 4.53* 5.39*  --  5.44*  CALCIUM 9.0 8.9  --  8.9  PHOS  --   --  4.3 3.9   Liver Function Tests: Recent Labs  Lab 08/05/22 1720 08/07/22 0033  AST 26  --   ALT 23  --   ALKPHOS 137*  --   BILITOT 0.5  --   PROT 7.5  --   ALBUMIN 2.9* 2.4*   No results for input(s): "LIPASE", "AMYLASE" in the last 168 hours. CBC: Recent Labs  Lab 08/05/22 1720 08/06/22 1030 08/06/22 1836 08/07/22 0033  WBC 8.8 7.8  7.5 7.4  HGB 12.7* 7.9* 7.2* 6.6*  HCT 39.3 24.3* 23.0* 21.2*  MCV 87.9 106.6* 105.5* 107.6*  PLT 357 115* 127* 113*   Blood Culture    Component Value Date/Time   SDES BLOOD BLOOD LEFT FOREARM 08/05/2022 2032   SPECREQUEST  08/05/2022 2032    BOTTLES DRAWN AEROBIC AND ANAEROBIC Blood Culture results may not be optimal due to an inadequate volume of blood received in culture bottles   CULT  08/05/2022 2032    NO GROWTH 2 DAYS Performed at [facility] [address] [place], [place] [address]    REPTSTATUS PENDING 08/05/2022 2032    Cardiac Enzymes: No results for input(s): "CKTOTAL", "CKMB", "CKMBINDEX", "TROPONINI" in the last 168 hours. CBG: Recent Labs  Lab 08/06/22 0756 08/07/22 0611  GLUCAP 99 117*   Iron Studies: No results for input(s): "IRON", "TIBC", "TRANSFERRIN", "FERRITIN" in the last 72 hours. Lab Results  Component Value Date   INR 2.0 (H) 06/10/2022   INR 2.8 (H) 06/09/2022   INR 2.9 (H) 06/07/2022   Studies/Results: DG Chest 2 View  Result Date: 08/05/2022 CLINICAL DATA:  Shortness of breath EXAM: CHEST - 2 VIEW COMPARISON:  Chest x-ray 07/08/2022.  Chest CT 06/30/2022. FINDINGS: Heart is enlarged, unchanged. There central pulmonary vascular congestion. Right-sided central venous catheter tip projects over the distal SVC. There is no lung consolidation, pleural effusion or pneumothorax. No acute fractures are seen. IMPRESSION: Cardiomegaly and central pulmonary vascular congestion. Electronically Signed   By: Ronney Asters M.D.   On: 08/05/2022 18:54    Medications:  anticoagulant sodium citrate      apixaban  5 mg Oral BID   Chlorhexidine Gluconate Cloth  6 each Topical Q0600   Chlorhexidine Gluconate Cloth  6 each Topical Q0600   cinacalcet  30 mg Oral Q T,Th,Sa-HD   doxercalciferol  6 mcg Intravenous Q T,Th,Sa-HD   doxycycline  100 mg Oral Q12H   ferrous sulfate  325 mg Oral Daily   fluticasone furoate-vilanterol  1 puff Inhalation Daily    lanthanum  1,000 mg Oral TID WC   melatonin  5 mg Oral QHS   torsemide  80 mg Oral BID    Dialysis Orders: TTS [facility] 4h 64min  425/ 1.5  180kg  3K/2.5Ca bath  RIJ TDC  NO HEPARIN - uses citrate for catheter lock solution - last HD at [facility] was 1/11, poast wt was 192 kg, 4 kg removed - prior HD at [facility] was 12/09 and prior to that, 11/25 - CE shows that pt was admitted at [facility] from 07/06/22 to 08/04/22 - venofer 50 weekly - hectorol 6 mcg tiw IV - sensipar 30 mg po tiw - mircera 225 mcg IV q2, last on 1/11, due on 1/25  Assessment/Plan: A/C hypoxic resp failure - on higher O2 here than usually uses at home, but no edema on CXR, +vasc congestion. Obvious vol overload in terms of LE edema. He received an extra HD yesterday but patient s/o early. On HD today. Push UF. HFrEF - EF 25%, per pmd, also followed by the CHF team ESRD - on HD TTS. Pt has been IP at [facility] 07/06/22-08/04/22 for acute hypoxic respiratory failure. Per discharge summary, patient was dismissed from previous hospitalization due to aggressive behavior and ongoing medical and hemodialysis non-compliance. Spoke with Dr. [name] yesterday who recommends daily HD while hospitalized. Given his history, we will do our best to dialyze to remove as much fluid as we can. Discussed with Dr. Jonnie Finner: will NOT dialyze tomorrow as this day is saved for emergencies only. Plan for HD Mon-Sat if patient permits. On HD now and tolerating UFG 4.5L, chip away at marked vol overload.  HTN/ volume - 12kg over dry after HD yesterday. Obvious anasarca as above Anemia esrd - Hgb is now 6.6 BUT patient has refused a blood transfusion at this time. I spoke with Dr. Howie Ill and notified the [facility] internal medicine team of the situation. Will continue to discuss with him. Patient with history of HIT so no heparin products. Using 4% sodium citrate for catheter lock solution (per outpatient HD records). MBD ckd - Cca and PO4 in range. Says he is only taking 1000mg  of  fosrenol with meals as phos binder. Will continue Fosrenol, IV vdra, and senispar.  Chron resp failure - due to MO/ OSA/ [place], NP [facility] 08/07/2022,9:38 AM  LOS: 0 days

## 2022-08-07 NOTE — Progress Notes (Signed)
Patient removed his oxygen stats dropped again in 80's placed humidified air

## 2022-08-07 NOTE — Progress Notes (Signed)
BIPAP set up and placed on stand by, ready for use. RT attempted x2 to place patient on bipap and he stated that he was not ready.

## 2022-08-07 NOTE — Progress Notes (Signed)
Patient removed his O2 stated the the humidified air was making him sick and drying out his nose I explained that I thought if I placed moisture you could tolerate oxygen he stated that it was dryng his nose. Removed the water and replaced nasal cannula and again asked that O2 not be removed

## 2022-08-07 NOTE — Progress Notes (Signed)
Rounding Note    Patient Name: Phillip Young Date of Encounter: 08/08/2022  [facility] Cardiologist: Marca Ancona, MD   Subjective   Upset that someone lowered his oxygen from 6L to 3L. Also requesting stronger pain medication for pain in the LE. No chest pain. Feels more SOB on the lower O2  Remains in sinus tach on tele 3600 mL removed with HD; net negative 2.2L Wt 419>425 (? Bed weight)  Inpatient Medications    Scheduled Meds:  apixaban  5 mg Oral BID   Chlorhexidine Gluconate Cloth  6 each Topical Q0600   cinacalcet  30 mg Oral Q T,Th,Sa-HD   doxercalciferol  6 mcg Intravenous Q T,Th,Sa-HD   doxycycline  100 mg Oral Q12H   fluticasone furoate-vilanterol  1 puff Inhalation Daily   lanthanum  1,000 mg Oral TID WC   melatonin  5 mg Oral QHS   Continuous Infusions:  anticoagulant sodium citrate     PRN Meds: acetaminophen **OR** acetaminophen, alteplase, anticoagulant sodium citrate, lidocaine (PF), lidocaine-prilocaine, mouth rinse, oxyCODONE, pentafluoroprop-tetrafluoroeth, sodium chloride   Vital Signs    Vitals:   08/08/22 0504 08/08/22 0743 08/08/22 0846 08/08/22 1031  BP: 123/62  114/60   Pulse: (!) 101  (!) 104 (!) 107  Resp: (!) 21  (!) 26 (!) 23  Temp: 98.1 F (36.7 C)  98.3 F (36.8 C)   TempSrc: Oral  Oral   SpO2: 96% 97% 97% 97%  Weight:      Height:        Intake/Output Summary (Last 24 hours) at 08/08/2022 1107 Last data filed at 08/08/2022 0554 Gross per 24 hour  Intake 1560 ml  Output 3850 ml  Net -2290 ml      08/08/2022    1:10 AM 08/07/2022    3:28 AM  Last 3 Weights  Weight (lbs) 425 lb 4.3 oz 419 lb 12.1 oz  Weight (kg) 192.9 kg 190.4 kg      Telemetry    Sinus tachycardia - Personally Reviewed  ECG    No new tracing - Personally Reviewed  Physical Exam   GEN: NAD, sitting in bed Neck: JVD to the ear Cardiac: Tachycardic, regular, no murmurs Respiratory: Diminished but clear GI: Morbidly obese,  soft MS: 3+ LE edema to the hips; bilateral LE wraps in place. Warm Neuro:  Nonfocal  Psych: Normal affect   Labs    High Sensitivity Troponin:   Recent Labs  Lab 08/05/22 1720 08/05/22 2032 08/06/22 1450  TROPONINIHS 14 51* 50*     Chemistry Recent Labs  Lab 08/05/22 1720 08/06/22 0534 08/07/22 0033 08/08/22 0107  NA 132* 132* 130* 130*  K 4.1 4.2 4.4 4.1  CL 96* 95* 95* 94*  CO2 21* 23 23 24   GLUCOSE 74 114* 106* 99  BUN 25* 32* 27* 21*  CREATININE 4.53* 5.39* 5.44* 4.95*  CALCIUM 9.0 8.9 8.9 8.9  PROT 7.5  --   --   --   ALBUMIN 2.9*  --  2.4* 2.5*  AST 26  --   --   --   ALT 23  --   --   --   ALKPHOS 137*  --   --   --   BILITOT 0.5  --   --   --   GFRNONAA 16* 13* 13* 15*  ANIONGAP 15 14 12 12     Lipids No results for input(s): "CHOL", "TRIG", "HDL", "LABVLDL", "LDLCALC", "CHOLHDL" in the last 168 hours.  Hematology  Recent Labs  Lab 08/06/22 1836 08/07/22 0033 08/08/22 0107  WBC 7.5 7.4 5.7  RBC 2.18* 1.97* 2.09*  HGB 7.2* 6.6* 7.1*  HCT 23.0* 21.2* 22.1*  MCV 105.5* 107.6* 105.7*  MCH 33.0 33.5 34.0  MCHC 31.3 31.1 32.1  RDW 15.5 15.8* 15.9*  PLT 127* 113* 135*   Thyroid No results for input(s): "TSH", "FREET4" in the last 168 hours.  BNP Recent Labs  Lab 08/05/22 1720  BNP 15.5    DDimer No results for input(s): "DDIMER" in the last 168 hours.   Radiology    No results found.  Cardiac Studies   TTE 02/2022: IMPRESSIONS     1. Left ventricular ejection fraction, by estimation, is 20 to 25%. The  left ventricle has severely decreased function. The left ventricle  demonstrates global hypokinesis. The left ventricular internal cavity size  was moderately dilated. Left  ventricular diastolic parameters were normal.   2. Right ventricular systolic function is normal. The right ventricular  size is mildly enlarged. There is severely elevated pulmonary artery  systolic pressure. The estimated right ventricular systolic pressure is   70.6 mmHg.   3. Left atrial size was mildly dilated.   4. Right atrial size was mildly dilated.   5. The mitral valve is normal in structure. Mild to moderate mitral valve  regurgitation. No evidence of mitral stenosis.   6. The aortic valve is normal in structure. Aortic valve regurgitation is  not visualized. No aortic stenosis is present.   7. The inferior vena cava is normal in size with greater than 50%  respiratory variability, suggesting right atrial pressure of 3 mmHg.   Comparison(s): No significant change from prior study. Prior images  reviewed side by side.   Patient Profile     37 y.o. 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   Assessment & Plan    #Acute on chronic systolic CHF: Nonischemic CMP, LHC 2021 with no CAD. Previous hospitalizations with cardiogenic shock in setting of rapid AFib/Flutter requiring milrinone. Most recent echo 8/23 with EF 20-25%, RV mildly reduced. Volume management with HD but has history of missing outpatient sessions and refusing care. Required CRRT at most recent admission at [facility] due to difficulty with volume management. Started on iHD yesterday.  - Continue volume management with HD; planned for daily sessions while hospitalized per Nephrology note - BP currently stable but may need to resume home midodrine 5 mg TID for him to tolerate HD - GDMT limited by CKD and hypotension requiring midodrine  - Not candidate for ICD given obesity and dialysis  - Not candidate for advanced therapies  #Acute on Chronic Hypoxic Respiratory Failure: Mainly driven by significant volume overload in the setting of acute on chronic systolic HF as above. Also with possible amiodarone lung toxicity, OSA and morbid obesity.   -Management of HF as above -Continue supplemental O2 -S/p steroid treatment for amio toxicity -CPAP at night  #Atrial flutter/fibrillation: History of multiple DCCVs. Amiodarone stopped due to concern for possible amiodarone pulmonary toxicity w/ elevated ESR and CRP 11/23. No rhythm controlling options at this time. Fortunately is maintaining sinus currently. - Ablation at [facility] 12/23 - Maintaining SR  - Not candidate for amiodarone (lung and hepatic toxicity) or Tikosyn (ESRD), no anti-arrhythmic option.   -  Would avoid dronedadrone with hx CHF. Apparently was on during recent admit, but not on discharge med list - Continue Eliquis 5 mg bid   #Possible amiodarone toxicity: Seen by pulmonary during admit 11/23. Had elevated ESR and sed rate. Started on prednisone at that time.  - Amiodarone has been stopped - Seen by pulmonary at [facility] 11/23  #ESRD: HD per nephrology -Significantly volume overloaded -Nephrology consulted; plan for daily HD sessions while inpatient to maximize volume removal  #Obesity: Body mass index is 57 - wt loss advised  #OSA: Continue CPAP QHS  #Lower extremity wounds: - Per primary  - Wound consulted  #Thrombocytopenia/anemia: - Refused transfusion yesterday; HgB 7.1 today - On eliquis - Management per primary      For questions or updates, please contact [facility] Please consult [email or web address] for contact info under        Signed, Meriam Sprague, MD  08/08/2022, 11:07 AM

## 2022-08-08 DIAGNOSIS — J9621 Acute and chronic respiratory failure with hypoxia: Secondary | ICD-10-CM | POA: Diagnosis not present

## 2022-08-08 DIAGNOSIS — G4733 Obstructive sleep apnea (adult) (pediatric): Secondary | ICD-10-CM | POA: Diagnosis not present

## 2022-08-08 DIAGNOSIS — I5043 Acute on chronic combined systolic (congestive) and diastolic (congestive) heart failure: Secondary | ICD-10-CM | POA: Diagnosis not present

## 2022-08-08 DIAGNOSIS — I4892 Unspecified atrial flutter: Secondary | ICD-10-CM | POA: Diagnosis not present

## 2022-08-08 LAB — RENAL FUNCTION PANEL
Albumin: 2.5 g/dL — ABNORMAL LOW (ref 3.5–5.0)
Anion gap: 12 (ref 5–15)
BUN: 21 mg/dL — ABNORMAL HIGH (ref 6–20)
CO2: 24 mmol/L (ref 22–32)
Calcium: 8.9 mg/dL (ref 8.9–10.3)
Chloride: 94 mmol/L — ABNORMAL LOW (ref 98–111)
Creatinine, Ser: 4.95 mg/dL — ABNORMAL HIGH (ref 0.61–1.24)
GFR, Estimated: 15 mL/min — ABNORMAL LOW (ref >60)
Glucose, Bld: 99 mg/dL (ref 70–99)
Phosphorus: 3.8 mg/dL (ref 2.5–4.6)
Potassium: 4.1 mmol/L (ref 3.5–5.1)
Sodium: 130 mmol/L — ABNORMAL LOW (ref 135–145)

## 2022-08-08 LAB — CBC
HCT: 22.1 % — ABNORMAL LOW (ref 39.0–52.0)
Hemoglobin: 7.1 g/dL — ABNORMAL LOW (ref 13.0–17.0)
MCH: 34 pg (ref 26.0–34.0)
MCHC: 32.1 g/dL (ref 30.0–36.0)
MCV: 105.7 fL — ABNORMAL HIGH (ref 80.0–100.0)
Platelets: 135 10*3/uL — ABNORMAL LOW (ref 150–400)
RBC: 2.09 MIL/uL — ABNORMAL LOW (ref 4.22–5.81)
RDW: 15.9 % — ABNORMAL HIGH (ref 11.5–15.5)
WBC: 5.7 10*3/uL (ref 4.0–10.5)
nRBC: 0.4 % — ABNORMAL HIGH (ref 0.0–0.2)

## 2022-08-08 LAB — GLUCOSE, CAPILLARY: Glucose-Capillary: 101 mg/dL — ABNORMAL HIGH (ref 70–99)

## 2022-08-08 MED ORDER — ANTICOAGULANT SODIUM CITRATE 4% (200MG/5ML) IV SOLN
5.0000 mL | Status: DC | PRN
  Administered 2022-08-09: 4.2 mL

## 2022-08-08 MED ORDER — ALTEPLASE 2 MG IJ SOLR: 2.0000 mg | Freq: Once | INTRAMUSCULAR | Status: DC | PRN

## 2022-08-08 MED ORDER — LIDOCAINE HCL (PF) 1 % IJ SOLN: 5.0000 mL | INTRAMUSCULAR | Status: DC | PRN

## 2022-08-08 MED ORDER — PENTAFLUOROPROP-TETRAFLUOROETH EX AERO: 1.0000 | INHALATION_SPRAY | CUTANEOUS | Status: DC | PRN

## 2022-08-08 MED ORDER — LIDOCAINE-PRILOCAINE 2.5-2.5 % EX CREA: 1.0000 | TOPICAL_CREAM | CUTANEOUS | Status: DC | PRN

## 2022-08-08 NOTE — Progress Notes (Signed)
Pt said does not want to be placed on bipap at this time. No distress noted

## 2022-08-08 NOTE — Progress Notes (Signed)
HD#3 Subjective:  Overnight Events: NOE  Pt was seen this AM bedside. He is complaining of pain in feet around bones and wounds. Very tender to touch. Endorses difficulty ambulating with walker due to pain and dyspnea.  He says his breathing isn't good., however does feel like he is getting volume off with dialysis. He thinks eliquis is causing hemoglobin to drop, and sometimes when he has superficial scratch wounds it bleeds a lot and takes a long time to stop.     Objective:  Vital signs in last 24 hours: Vitals:   08/08/22 0504 08/08/22 0743 08/08/22 0846 08/08/22 1031  BP: 123/62  114/60   Pulse: (!) 101  (!) 104 (!) 107  Resp: (!) 21  (!) 26 (!) 23  Temp: 98.1 F (36.7 C)  98.3 F (36.8 C)   TempSrc: Oral  Oral   SpO2: 96% 97% 97% 97%  Weight:      Height:       Supplemental O2: Nasal Cannula SpO2: 97 % O2 Flow Rate (L/min): 3 L/min FiO2 (%): 40 %   Physical Exam:  Constitutional: Chronically ill-appearing Neck: HD line in right IJ Cardiovascular: regular rate and rhythm, no m/r/g Pulmonary/Chest: normal work of breathing on 6L [place], exam limited to anterior lungs, diffuse crackles appreciated bilaterally Skin: 3+ pitting edema to mid thigh, wounds present to ankles bilaterally, no erythema or warmth  Filed Weights   08/07/22 0328 08/08/22 0110  Weight: (!) 190.4 kg (!) 192.9 kg     Intake/Output Summary (Last 24 hours) at 08/08/2022 1126 Last data filed at 08/08/2022 0554 Gross per 24 hour  Intake 1560 ml  Output 3850 ml  Net -2290 ml    Net IO Since Admission: -3,290 mL [08/08/22 1126]  Pertinent Labs:    Latest Ref Rng & Units 08/08/2022    1:07 AM 08/07/2022   12:33 AM 08/06/2022    6:36 PM  CBC  WBC 4.0 - 10.5 K/uL 5.7  7.4  7.5   Hemoglobin 13.0 - 17.0 g/dL 7.1  6.6  7.2   Hematocrit 39.0 - 52.0 % 22.1  21.2  23.0   Platelets 150 - 400 K/uL 135  113  127        Latest Ref Rng & Units 08/08/2022    1:07 AM 08/07/2022   12:33 AM 08/06/2022     5:34 AM  CMP  Glucose 70 - 99 mg/dL 99  106  114   BUN 6 - 20 mg/dL 21  27  32   Creatinine 0.61 - 1.24 mg/dL 4.95  5.44  5.39   Sodium 135 - 145 mmol/L 130  130  132   Potassium 3.5 - 5.1 mmol/L 4.1  4.4  4.2   Chloride 98 - 111 mmol/L 94  95  95   CO2 22 - 32 mmol/L 24  23  23    Calcium 8.9 - 10.3 mg/dL 8.9  8.9  8.9     Imaging: No results found.  Assessment/Plan:   Principal Problem:   Acute on chronic hypoxic respiratory failure ([name]) Active Problems:   OSA (obstructive sleep apnea)   Hidradenitis suppurativa   Paroxysmal atrial flutter ([name])   ESRD (end stage renal disease) on dialysis ([name])   Acute on chronic respiratory failure with hypoxia ([name])   Acute exacerbation of CHF (congestive heart failure) ([name])   Acute on chronic diastolic heart failure ([place])   Patient Summary: Phillip Young is a 37 y.o. with a pertinent  PMH of obesity, HFrEF with EF 20 to 25%, A-fib-a flutter, ESRD on HD, and OSA, who presented with lower extremity edema and shortness of breath and admitted on 1/11 for acute on chronic heart failure exacerbation on HD#3.   Acute on chronic hypoxic respiratory failure Acute on chronic HFrEF with EF 20 to 25% Patient admitted for repeat heart failure exacerbation.  This is his third admission in the last 4 months.   Mr. Allnutt does not want to take torsemide or Lasix and has missed dialysis sessions in the past leading to exacerbations.  He is also being treated with steroids for amiodarone toxicity and this likely contributed to present exacerbation.  Heart failure team evaluated him and recommended serial dialysis sessions to remove volume.  4.5L were removed during HD yesterday.  Weight is currently 192.9, similar to admission weight.  Urine output of 256mL. Continue believe pt has poor insight into heart failure and kidney failure, and attempts to redirect have been unsuccessful.  Cardiology is not able to offer advanced therapies for him. Per nephrology,  will do daily dialysis sessions starting tomorrow, with goal being 4-5L fluid removal.    Plan: -Serial HD sessions -Daily Weights -Strict I and Os -Appreciate nephrology and heart failure team's assistance -Not candidate for ICD due to obesity and dialysis  Anemia of chronic disease Iron deficiency anemia Hemoglobin has improved from 6.6 to 7.1. He believes it is eliquis that is causing his low blood count. He denied blood transfusion yesterday, and only under 6 will he accept.   Plan:  -Daily CBC -Plan to revisit blood transfusion with patient.  He is open to further discussion.  Thrombocytopenia Patient with history of HIT.  Platelets dropped from 300s to 100s in last 3 days. Platelet count improved from 113 yesterday to 135.  HIT score intermediate at 5. He has not received heparin this hospital admission. Smear returned negative for abnormal morphologies. Will continue to monitor.   Plan: -Daily CBC  Possible amiodarone toxicity He was seen by pulmonology during admission in November.  He was started on prednisone and amiodarone discontinued.  ESRD He receives HD Tuesday Thursday Saturday in the outpatient setting.  He was dismissed from prior hospitalization in which she had acute hypoxic respiratory failure secondary to volume overload due to aggressive behavior and ongoing medical/HD nonadherence.  Will need daily sessions of HD starting tomorrow.   OSA/ OHS - Cpap qhs  Paroxysmal atrial fibrillation/flutter Patient previously took amiodarone however developed toxicity and was being prescribed steroids for this.  [facility]  He is at high risk of developing A-fib. Not a candidate for any anti-arrhythmic medications. He is refusing his eliquis because he believes it is causing his anemia.    Plan: -Attempt to continue Eliquis 5 mg twice daily -Continue telemetry monitoring  Bilateral malleolus wounds secondary to chronic  edema Patient has bilateral wounds to lower extremities.  Skin is not warm and physical exam reflects edema.  -Wound consult -Oxycodone on 5 mg every 6 hours as needed  History of hidradenitis suppurativa Medications include doxycycline 100 mg twice daily.  This was continued during admission.  Diet: Renal IVF: none VTE: NOAC Code: Full PT/OT recs: Home Health, c rollator with O2 attachment. Family Update:   Dispo: Anticipated discharge to  home  in >2 days days pending Volume removal.   Drucie Opitz, MD Internal Medicine Resident, PGY-1 [facility]  Pager: [phone] 11:26 AM, 08/08/2022   **  Please contact the on call pager after 5 pm and on weekends at [phone].**

## 2022-08-08 NOTE — Progress Notes (Addendum)
0700: O2 @ 6LPM satting 98%. Pt refusing O2 titration "please don't mess with my air." I explained to pt that O2 is treated like a drug and should be managed by the RN, not the pt, as it can be harmful at doses higher than indicated. Deferred to primary team also having nosebleeds, but refusing O2 humidification  Requesting additional pain mediction r/t BL foot pain; pt educated on need to elevate his feet to reduce swelling; he's asking for a "short cut" like pain medication to help instead Provider discussed O2 with pt and goal of down-titrating O2 to home dose of "1-3L." Pt endorses understanding. Provider initiated down titration at 5LPM  0830: refusing fosrenol, breo ellipta inhaler, and eliquis  4628: pt sleeping; difficult to arouse and not responding to my reassessment questions. Unclear if ignoring me vs. somnolent. Pt encouraged to participate in assessment as my concern for AMS grew; concerns relayed to pt including increased risk of thromboembolic event r/t refusal of eliquis. Appears pt may be slow to wake from sleeping as he remains oriented x 4 and became more interactive as I began cleaning up his tray ("don't take that, leave it alone"). O2 to 4LPM  1230: refusing fosrenol. O2 titrated to 3LPM w/o complaint  1500: Found pt on 5LPM O2; titrated down to 4LPM  1600: Found pt asleep on RA satting 74%. O2 replaced at 3LPM and pt quickly recovered to 94%. Pt asked if I "lowered his air" When I responded that I did at the advice of the doctor, pt became irate and began yelling expletives and demanded a new RN. I explained to pt that this plan was discussed with provider, but he continued to yell. During this yelling episode, pt pulled off his O2 cannula and maintained O2 sats in the 90s. He quickly got OOB independently and titrated his own O2 back up to 5LPM. Charge nurse and medical team notified.

## 2022-08-08 NOTE — Progress Notes (Addendum)
[facility] Progress Note   Subjective:    Seen and examined at bedside. No acute complaints. Remains profoundly overloaded. Next HD 1/15-plan to do daily HD (Monday-Sat) while hospitalized so remove as much fluid as we can. Hgb today 7.1.   Objective Vitals:   08/08/22 0110 08/08/22 0504 08/08/22 0743 08/08/22 0846  BP: 111/63 123/62  114/60  Pulse: 99 (!) 101  (!) 104  Resp: (!) 22 (!) 21  (!) 26  Temp: 99.8 F (37.7 C) 98.1 F (36.7 C)  98.3 F (36.8 C)  TempSrc: Oral Oral  Oral  SpO2: 100% 96% 97% 97%  Weight: (!) 192.9 kg     Height:       Physical Exam General: Awake, alert, NAD, on 4L [place] Heart:S1 and S2; No MGRs Lungs: Clear anteriorly bilaterally Abdomen: Obese, soft, and non-tender Extremities: Diffuse anasarca of BL LE's with dressings on lower BL legs (CDI) Dialysis Access: R IJ [facility]   Filed Weights   08/07/22 0328 08/08/22 0110  Weight: (!) 190.4 kg (!) 192.9 kg    Intake/Output Summary (Last 24 hours) at 08/08/2022 0953 Last data filed at 08/08/2022 0554 Gross per 24 hour  Intake 1560 ml  Output 3850 ml  Net -2290 ml    Additional Objective Labs: Basic Metabolic Panel: Recent Labs  Lab 08/06/22 0534 08/06/22 1450 08/07/22 0033 08/08/22 0107  NA 132*  --  130* 130*  K 4.2  --  4.4 4.1  CL 95*  --  95* 94*  CO2 23  --  23 24  GLUCOSE 114*  --  106* 99  BUN 32*  --  27* 21*  CREATININE 5.39*  --  5.44* 4.95*  CALCIUM 8.9  --  8.9 8.9  PHOS  --  4.3 3.9 3.8   Liver Function Tests: Recent Labs  Lab 08/05/22 1720 08/07/22 0033 08/08/22 0107  AST 26  --   --   ALT 23  --   --   ALKPHOS 137*  --   --   BILITOT 0.5  --   --   PROT 7.5  --   --   ALBUMIN 2.9* 2.4* 2.5*   No results for input(s): "LIPASE", "AMYLASE" in the last 168 hours. CBC: Recent Labs  Lab 08/05/22 1720 08/06/22 1030 08/06/22 1836 08/07/22 0033 08/08/22 0107  WBC 8.8 7.8 7.5 7.4 5.7  NEUTROABS  --   --   --  5.2  --   HGB 12.7* 7.9* 7.2* 6.6*  7.1*  HCT 39.3 24.3* 23.0* 21.2* 22.1*  MCV 87.9 106.6* 105.5* 107.6* 105.7*  PLT 357 115* 127* 113* 135*   Blood Culture    Component Value Date/Time   SDES BLOOD BLOOD LEFT FOREARM 08/05/2022 2032   SPECREQUEST  08/05/2022 2032    BOTTLES DRAWN AEROBIC AND ANAEROBIC Blood Culture results may not be optimal due to an inadequate volume of blood received in culture bottles   CULT  08/05/2022 2032    NO GROWTH 3 DAYS Performed at [facility] [address] [place], [place] [address]    REPTSTATUS PENDING 08/05/2022 2032    Cardiac Enzymes: No results for input(s): "CKTOTAL", "CKMB", "CKMBINDEX", "TROPONINI" in the last 168 hours. CBG: Recent Labs  Lab 08/06/22 0756 08/07/22 0611 08/08/22 0821  GLUCAP 99 117* 101*   Iron Studies: No results for input(s): "IRON", "TIBC", "TRANSFERRIN", "FERRITIN" in the last 72 hours. Lab Results  Component Value Date   INR 2.0 (H) 06/10/2022  INR 2.8 (H) 06/09/2022   INR 2.9 (H) 06/07/2022   Studies/Results: No results found.  Medications:   apixaban  5 mg Oral BID   Chlorhexidine Gluconate Cloth  6 each Topical Q0600   cinacalcet  30 mg Oral Q T,Th,Sa-HD   doxercalciferol  6 mcg Intravenous Q T,Th,Sa-HD   doxycycline  100 mg Oral Q12H   ferrous sulfate  325 mg Oral Daily   fluticasone furoate-vilanterol  1 puff Inhalation Daily   lanthanum  1,000 mg Oral TID WC   melatonin  5 mg Oral QHS    Dialysis Orders: TTS [facility] 4h 81min  425/ 1.5  180kg  3K/2.5Ca bath  RIJ TDC  NO HEPARIN - uses citrate for catheter lock solution - last HD at [facility] was 1/11, poast wt was 192 kg, 4 kg removed - prior HD at [facility] was 12/09 and prior to that, 11/25 - CE shows that pt was admitted at [facility] from 07/06/22 to 08/04/22 - venofer 50 weekly - hectorol 6 mcg tiw IV - sensipar 30 mg po tiw - mircera 225 mcg IV q2, last on 1/11, due on 1/25  Assessment/Plan: A/C hypoxic resp failure - on higher O2 here than usually uses at home, but no edema on  CXR, +vasc congestion. Obvious vol overload in terms of LE edema. He received an extra HD yesterday but patient s/o early. On HD today. Push UF. HFrEF - EF 25%, per pmd, also followed by the CHF team ESRD - on HD TTS. Pt has been IP at [facility] 07/06/22-08/04/22 for acute hypoxic respiratory failure. Per discharge summary, patient was dismissed from previous hospitalization due to aggressive behavior and ongoing medical and hemodialysis non-compliance. Spoke with Dr. [name] 1/12 who recommends daily HD while hospitalized. Given his history, we will do our best to dialyze to remove as much fluid as we can. Discussed with Dr. Jonnie Finner: Plan for HD Mon-Sat if patient permits. Plan for UFG 4-5L and 4hrs for tomorrow, chip away at marked vol overload.  HTN/ volume - 12kg over dry weight as of today. Obvious anasarca as above Anemia esrd - Hgb 6.6 yesterday and he refused a blood transfusion. Medical team is aware. Hgb 7.1 today. Continue to follow trend closely and will continue to discuss with patient if a transfusion is indicated. Patient with history of HIT so no heparin products. Using 4% sodium citrate for catheter lock solution (per outpatient HD records). Stopping PO Fe as he is on dialysis. Will check iron studies in AM. MBD ckd - Cca and PO4 in range. Says he is only taking 1000mg  of fosrenol with meals as phos binder. Will continue Fosrenol, IV vdra, and senispar.  Afib/A-flutter - followed by Cardiology: hx DCCVs, s/p ablation 12/23 at [facility], amiodarone stopped d/t possible amiodarone pulmonary toxicity, on Eliquis. Possible amiodarone toxicity - seen by pulmonary 11/23 (started on Prednisone during that time), amiodarone stopped Chron resp failure - due to MO/ OSA/ [place], NP [facility] 08/08/2022,9:53 AM  LOS: 1 day

## 2022-08-08 NOTE — Progress Notes (Signed)
Pt states he is on the phone and not ready for his BIPAP.

## 2022-08-09 LAB — RENAL FUNCTION PANEL
Albumin: 2.4 g/dL — ABNORMAL LOW (ref 3.5–5.0)
Anion gap: 13 (ref 5–15)
BUN: 33 mg/dL — ABNORMAL HIGH (ref 6–20)
CO2: 22 mmol/L (ref 22–32)
Calcium: 8.9 mg/dL (ref 8.9–10.3)
Chloride: 94 mmol/L — ABNORMAL LOW (ref 98–111)
Creatinine, Ser: 7.08 mg/dL — ABNORMAL HIGH (ref 0.61–1.24)
GFR, Estimated: 9 mL/min — ABNORMAL LOW (ref >60)
Glucose, Bld: 111 mg/dL — ABNORMAL HIGH (ref 70–99)
Phosphorus: 4.9 mg/dL — ABNORMAL HIGH (ref 2.5–4.6)
Potassium: 4.3 mmol/L (ref 3.5–5.1)
Sodium: 129 mmol/L — ABNORMAL LOW (ref 135–145)

## 2022-08-09 LAB — CBC WITH DIFFERENTIAL/PLATELET
Abs Immature Granulocytes: 0.05 10*3/uL (ref 0.00–0.07)
Basophils Absolute: 0 10*3/uL (ref 0.0–0.1)
Basophils Relative: 0 %
Eosinophils Absolute: 0.3 10*3/uL (ref 0.0–0.5)
Eosinophils Relative: 4 %
HCT: 20.9 % — ABNORMAL LOW (ref 39.0–52.0)
Hemoglobin: 6.7 g/dL — CL (ref 13.0–17.0)
Immature Granulocytes: 1 %
Lymphocytes Relative: 13 %
Lymphs Abs: 0.8 10*3/uL (ref 0.7–4.0)
MCH: 34 pg (ref 26.0–34.0)
MCHC: 32.1 g/dL (ref 30.0–36.0)
MCV: 106.1 fL — ABNORMAL HIGH (ref 80.0–100.0)
Monocytes Absolute: 0.9 10*3/uL (ref 0.1–1.0)
Monocytes Relative: 14 %
Neutro Abs: 4.2 10*3/uL (ref 1.7–7.7)
Neutrophils Relative %: 68 %
Platelets: 170 10*3/uL (ref 150–400)
RBC: 1.97 MIL/uL — ABNORMAL LOW (ref 4.22–5.81)
RDW: 15.9 % — ABNORMAL HIGH (ref 11.5–15.5)
WBC: 6.2 10*3/uL (ref 4.0–10.5)
nRBC: 0.5 % — ABNORMAL HIGH (ref 0.0–0.2)

## 2022-08-09 LAB — IRON AND TIBC
Iron: 28 ug/dL — ABNORMAL LOW (ref 45–182)
Saturation Ratios: 9 % — ABNORMAL LOW (ref 17.9–39.5)
TIBC: 330 ug/dL (ref 250–450)
UIBC: 302 ug/dL

## 2022-08-09 LAB — FERRITIN: Ferritin: 156 ng/mL (ref 24–336)

## 2022-08-09 MED ORDER — DARBEPOETIN ALFA 200 MCG/0.4ML IJ SOSY
200.0000 ug | PREFILLED_SYRINGE | Freq: Once | INTRAMUSCULAR | Status: AC
  Administered 2022-08-09: 200 ug via SUBCUTANEOUS
  Filled 2022-08-09: qty 0.4

## 2022-08-09 MED ORDER — SODIUM CHLORIDE 0.9 % IV SOLN
250.0000 mg | Freq: Every day | INTRAVENOUS | Status: AC
  Administered 2022-08-10 – 2022-08-12 (×2): 250 mg via INTRAVENOUS
  Filled 2022-08-09 (×5): qty 20

## 2022-08-09 MED ORDER — CAMPHOR-MENTHOL 0.5-0.5 % EX LOTN
TOPICAL_LOTION | CUTANEOUS | Status: DC | PRN
  Filled 2022-08-09: qty 222

## 2022-08-09 NOTE — Progress Notes (Signed)
  Chart reviewed. Stable from cardiac perspective.  Needs volume removal with HD.   AHF team will sign off. Please call with questions.   Glori Bickers, MD  9:00 AM

## 2022-08-09 NOTE — Progress Notes (Signed)
PT Cancellation Note  Patient Details Name: Phillip Young MRN: [id number] DOB: March 29, 1986   Cancelled Treatment:    Reason Eval/Treat Not Completed: Patient at procedure or test/unavailable.  In HD, will retry as time and pt allow.   Ramond Dial 08/09/2022, 12:39 PM  Mee Hives, PT PhD Acute Rehab Dept. Number: [place]

## 2022-08-09 NOTE — Procedures (Signed)
HD Note:  Some information was entered later than the data was gathered due to patient care needs. The stated time with the data is accurate.  Patient alert and oriented, tolerated treatment without difficulty. No heparin d/t hx of HIT. Sodium citrate for catheter dwell solution.  No issues with HD catheter used for treatment. UF total 5000 ml

## 2022-08-09 NOTE — Consult Note (Signed)
WOC consulted for "bilateral from fluid volume overload".  Please see wound care orders for bilateral LEs, patient has been evaluated this admission by [facility] nurse 08/06/22, see consultation note.  Re consult if needed, will not follow at this time. Thanks  August Gosser [employer], RN,CWOCN, CNS, [place] [phone])

## 2022-08-09 NOTE — Progress Notes (Signed)
[facility] Progress Note   Assessment/ Plan:   Dialysis Orders: TTS [facility] 4h 30min  425/ 1.5  180kg  3K/2.5Ca bath  RIJ TDC  NO HEPARIN - uses citrate for catheter lock solution - last HD at [facility] was 1/11, poast wt was 192 kg, 4 kg removed - prior HD at [facility] was 12/09 and prior to that, 11/25 - CE shows that pt was admitted at [facility] from 07/06/22 to 08/04/22 - venofer 50 weekly - hectorol 6 mcg tiw IV - sensipar 30 mg po tiw - mircera 225 mcg IV q2, last on 1/11, due on 1/25   Assessment/Plan: A/C hypoxic resp failure - on higher O2 here than usually uses at home, but no edema on CXR, +vasc congestion. Obvious vol overload in terms of LE edema. Needs daily dialysis for max fluid removal HFrEF - EF 25%, per pmd, also followed by the CHF team ESRD - on HD TTS. Pt has been IP at [facility] 07/06/22-08/04/22 for acute hypoxic respiratory failure. Per discharge summary, patient was dismissed from previous hospitalization due to aggressive behavior and ongoing medical and hemodialysis non-compliance. Spoke with Dr. [name] 1/12 who recommends daily HD while hospitalized.  HTN/ volume - 12kg over dry weight as of today. Obvious anasarca as above Anemia esrd - Hgb 6.6 1/13, declined a blood transfusion. 6.7 this AM.  Continue to follow trend closely and will continue to discuss with patient if a transfusion is indicated. Patient with history of HIT so no heparin products. Using 4% sodium citrate for catheter lock solution (per outpatient HD records). Stopping PO Fe as he is on dialysis. Iron studies pending.  Order Aranesp 200 mcg 08/09/22. MBD ckd - Cca and PO4 in range. Says he is only taking 1000mg  of fosrenol with meals as phos binder. Will continue Fosrenol, IV vdra, and senispar.  Afib/A-flutter - followed by Cardiology: hx DCCVs, s/p ablation 12/23 at [facility], amiodarone stopped d/t possible amiodarone pulmonary toxicity, on Eliquis. Possible amiodarone toxicity - seen by pulmonary 11/23 (started  on Prednisone during that time), amiodarone stopped Chron resp failure - due to MO/ OSA/ OHS  Subjective:    Hbg 6.7 this AM.  Resting comfortably.   No complaints.    Objective:   BP 111/67 (BP Location: Left Wrist)   Pulse 100   Temp 99 F (37.2 C) (Oral)   Resp (!) 24   Ht 6' (1.829 m)   Wt (!) 191.7 kg   SpO2 100%   BMI 57.32 kg/m   Physical Exam: Gen: NAD, sitting in bed CVS: RRR Resp: clear anteriorly Abd: soft Ext: 1+ anasarca [facility]  Labs: BMET Recent Labs  Lab 08/05/22 1720 08/06/22 0534 08/06/22 1450 08/07/22 0033 08/08/22 0107 08/09/22 0042  NA 132* 132*  --  130* 130* 129*  K 4.1 4.2  --  4.4 4.1 4.3  CL 96* 95*  --  95* 94* 94*  CO2 21* 23  --  23 24 22   GLUCOSE 74 114*  --  106* 99 111*  BUN 25* 32*  --  27* 21* 33*  CREATININE 4.53* 5.39*  --  5.44* 4.95* 7.08*  CALCIUM 9.0 8.9  --  8.9 8.9 8.9  PHOS  --   --  4.3 3.9 3.8 4.9*   CBC Recent Labs  Lab 08/06/22 1836 08/07/22 0033 08/08/22 0107 08/09/22 0042  WBC 7.5 7.4 5.7 6.2  NEUTROABS  --  5.2  --  4.2  HGB 7.2* 6.6* 7.1*  6.7*  HCT 23.0* 21.2* 22.1* 20.9*  MCV 105.5* 107.6* 105.7* 106.1*  PLT 127* 113* 135* 170      Medications:     apixaban  5 mg Oral BID   Chlorhexidine Gluconate Cloth  6 each Topical Q0600   cinacalcet  30 mg Oral Q T,Th,Sa-HD   doxercalciferol  6 mcg Intravenous Q T,Th,Sa-HD   doxycycline  100 mg Oral Q12H   fluticasone furoate-vilanterol  1 puff Inhalation Daily   lanthanum  1,000 mg Oral TID WC   melatonin  5 mg Oral QHS     Madelon Lips, MD 08/09/2022, 9:03 AM

## 2022-08-09 NOTE — Progress Notes (Signed)
Pt BP reading 96/80, UF turned off d/t orders stating to keep SBP greater than 100, cuff readjusted and retook. Will document new reading and adjust UF goal as needed

## 2022-08-09 NOTE — Progress Notes (Signed)
BP retaken, UF removal turned back on, Pt BP reading 118/65, cuff is on pt lower arm.

## 2022-08-09 NOTE — Progress Notes (Signed)
08/09/2022 1:30 AM Critical Hgb of 6.7 this am.  Notified Dr. Posey Pronto of IM.  Dr. Posey Pronto to come see pt and discuss transfusion. Phillip Young

## 2022-08-09 NOTE — Procedures (Signed)
Patient seen and examined on Hemodialysis. BP 118/65 (BP Location: Left Wrist)   Pulse (!) 105   Temp 99 F (37.2 C) (Oral)   Resp (!) 23   Ht 6' (1.829 m)   Wt (!) 191.7 kg   SpO2 98%   BMI 57.32 kg/m   QB 400 mL/ min via R IJ TDC , UF goal 5L.  Tolerating treatment without complaints at this time.  Will do daily dialysis while inpt   Madelon Lips MD [place] [facility] [phone] 9:07 AM

## 2022-08-09 NOTE — Progress Notes (Signed)
HD#2 SUBJECTIVE:  Patient Summary: Phillip Young is a 37 y.o. with a pertinent PMH of EF 20 to 25%, A-fib-a flutter, ESRD on HD, and OSA, who presented with lower extremity edema and shortness of breath and admitted on 1/11 for acute on chronic heart failure exacerbation.   Overnight Events: Hgb dropped <7 again, patient continues to refuse blood transfusion.  Interim History: Patient feels like he is having gradual improvement each day and today he feels like he is "getting better fast." He expresses concern about his breathing and supplemental oxygen level, and I reiterated my goal of titrating him down to his home supplementation of 1-3L. I explained that during my exam his oxygen level was 100% on 5L, and that I felt that he would safely tolerate titrating it down slowly. I reassured him this would not be rapid and in a way that causes him acute respiratory difficulty. We discussed his hemoglobin drop and recommendation for blood transfusion, and I explained that low hemoglobin was likely to be affecting his breathing negatively as well. He had no further complaints.  OBJECTIVE:  Vital Signs: Vitals:   08/09/22 0058 08/09/22 0500 08/09/22 0533 08/09/22 0534  BP:      Pulse:   (!) 103 100  Resp:   (!) 22 16  Temp:    99.8 F (37.7 C)  TempSrc:    Oral  SpO2:   (!) 89% 94%  Weight: (!) 193.4 kg (!) 193.4 kg    Height:       Supplemental O2: Nasal Cannula SpO2: 94 % O2 Flow Rate (L/min): 5 L/min (pt got oob independently to turn O2 flow meter back up) FiO2 (%): 40 %  Filed Weights   08/08/22 0110 08/09/22 0058 08/09/22 0500  Weight: (!) 192.9 kg (!) 193.4 kg (!) 193.4 kg     Intake/Output Summary (Last 24 hours) at 08/09/2022 0651 Last data filed at 08/08/2022 2059 Gross per 24 hour  Intake 1422 ml  Output 200 ml  Net 1222 ml   Net IO Since Admission: -2,068 mL [08/09/22 0651]  Physical Exam: Physical Exam Constitutional:      Appearance: He is obese.   Cardiovascular:     Rate and Rhythm: Regular rhythm. Tachycardia present.     Heart sounds: No murmur heard.    No friction rub. No gallop.  Pulmonary:     Effort: Pulmonary effort is normal.  Abdominal:     Palpations: Abdomen is soft.     Tenderness: There is no abdominal tenderness.  Musculoskeletal:     Right lower leg: Tenderness present. Edema (3+) present.     Left lower leg: Tenderness present. Edema (3+) present.  Skin:    General: Skin is warm and dry.  Neurological:     General: No focal deficit present.     Mental Status: He is alert.  Psychiatric:        Mood and Affect: Mood normal.        Behavior: Behavior normal.     Patient Lines/Drains/Airways Status     Active Line/Drains/Airways     Name Placement date Placement time Site Days   Peripheral IV 08/05/22 20 G Right Hand 08/05/22  1818  Hand  4   Hemodialysis Catheter Right Internal jugular Double lumen Permanent (Tunneled) 08/20/21  1324  Internal jugular  354   Wound / Incision (Open or Dehisced) 08/05/22 Ankle Anterior;Right;Lateral open wound wrapped with abd and kerlix. weeping 08/05/22  --  Ankle  4  Wound / Incision (Open or Dehisced) 08/05/22 Ankle Anterior;Left;Lateral open wounds wrapped with abd and kerlix, weeping 08/05/22  --  Ankle  4   Wound / Incision (Open or Dehisced) 08/06/22 Skin tear Thigh Posterior;Proximal;Right 08/06/22  --  Thigh  3             ASSESSMENT/PLAN:  Assessment: Principal Problem:   Acute on chronic hypoxic respiratory failure ([name]) Active Problems:   OSA (obstructive sleep apnea)   Hidradenitis suppurativa   Paroxysmal atrial flutter ([name])   ESRD (end stage renal disease) on dialysis ([name])   Acute on chronic respiratory failure with hypoxia ([name])   Acute exacerbation of CHF (congestive heart failure) ([name])   Acute on chronic diastolic heart failure ([place])   Plan: Acute on chronic hypoxic respiratory failure Acute on chronic HFrEF with EF 20 to  25% Weight is unchanged from admission and he is +1.2L since admission as well. He has undergone serial HD sessions to help with volume overload and is currently undergoing HD. AHF team signed off today as he is stable from a cardiac perspective. He is recommended to undergo daily HD sessions while hospitalized. He is not a candidate for advanced therapies per cardiology unfortunately.  -Continue serial HD sessions -Wean O2 to home 1-3L [place] -Daily weights -Strict I and Os -Encouraged patient to get OOB with mobility tech and to keep bilateral LE elevated to encourage further reduction in LE edema.  -Cardiology consulted and now signed off, appreciate their assistance -Nephrology consulted, appreciate their assistance -Will order Aranesp today. Discontinue PO iron.   Anemia of chronic disease Iron deficiency anemia Hemoglobin remains low but stable, 6.7 this morning. He continues to feel that eliquis is responsible for his low hemoglobin levels and reiterates that when he scratches, he bleeds a lot. I explained that I do not suspect eliquis to be the primary cause of his anemia and explained further that his multiple severe medical conditions are making it very difficult for his body to regenerate blood like it needs. He does not want blood transfusion unless Hgb <6 or he has symptoms. Our team explained that his feelings of shortness of breath are likely being made worse by how low his hemoglobin is. I reiterated that the normal threshold for blood transfusion is hemoglobin <7. He voiced understanding to this. -Daily CBC   ESRD Plan is for daily HD while hospitalized. He receives HD Tuesday Thursday Saturday in the outpatient setting. He was dismissed from prior hospitalization in which she had acute hypoxic respiratory failure secondary to volume overload due to aggressive behavior and ongoing medical/HD nonadherence.    Thrombocytopenia Platelets improved today to 170. He has a history of HIT  but has not received heparin products during this admission. -Daily CBC   OSA/ OHS -Nightly CPAP  Paroxysmal atrial fibrillation/flutter Patient has had ablation completed. He remains high risk for returning to abnormal rhythm. He has been intermittently refusing his eliquis due to concerns that his anemia is due to the medication. Our team has counseled him extensively regarding eliquis. -Continue Eliquis 5 mg BID -Continue telemetry monitoring   Bilateral malleolus wounds secondary to chronic edema Unchanged, secondary to now-improving lower extremity edema. -Oxycodone on 5 mg q6h PRN severe pain -Patient educated on elevation of LE and ambulation to assist in further reduction of LE edema which is more likely than not the main driver of his pain   History of hidradenitis suppurativa -Continued home doxycycline 100 mg BID  Best Practice: Diet:  Renal diet with 1200 mL fluid restriction IVF: None Code: Full DISPO: Anticipated discharge to Home pending  medical stability .  Signature: Farrel Gordon, D.O.  Internal Medicine Resident, PGY-2 [facility]  Pager: [phone]   Please contact the on call pager after 5 pm and on weekends at [phone].

## 2022-08-09 NOTE — Progress Notes (Signed)
Attempted to see pt today but pt at dialysis.  Will check back as schedule allows.  Jinger Neighbors, [place] [phone]

## 2022-08-10 DIAGNOSIS — J9621 Acute and chronic respiratory failure with hypoxia: Secondary | ICD-10-CM | POA: Diagnosis not present

## 2022-08-10 DIAGNOSIS — Z87891 Personal history of nicotine dependence: Secondary | ICD-10-CM | POA: Diagnosis not present

## 2022-08-10 LAB — BASIC METABOLIC PANEL
Anion gap: 10 (ref 5–15)
BUN: 21 mg/dL — ABNORMAL HIGH (ref 6–20)
CO2: 25 mmol/L (ref 22–32)
Calcium: 8.9 mg/dL (ref 8.9–10.3)
Chloride: 95 mmol/L — ABNORMAL LOW (ref 98–111)
Creatinine, Ser: 5.68 mg/dL — ABNORMAL HIGH (ref 0.61–1.24)
GFR, Estimated: 12 mL/min — ABNORMAL LOW (ref >60)
Glucose, Bld: 110 mg/dL — ABNORMAL HIGH (ref 70–99)
Potassium: 4.2 mmol/L (ref 3.5–5.1)
Sodium: 130 mmol/L — ABNORMAL LOW (ref 135–145)

## 2022-08-10 LAB — CBC
HCT: 21.2 % — ABNORMAL LOW (ref 39.0–52.0)
Hemoglobin: 6.8 g/dL — CL (ref 13.0–17.0)
MCH: 33.7 pg (ref 26.0–34.0)
MCHC: 32.1 g/dL (ref 30.0–36.0)
MCV: 105 fL — ABNORMAL HIGH (ref 80.0–100.0)
Platelets: 201 10*3/uL (ref 150–400)
RBC: 2.02 MIL/uL — ABNORMAL LOW (ref 4.22–5.81)
RDW: 15.7 % — ABNORMAL HIGH (ref 11.5–15.5)
WBC: 6.4 10*3/uL (ref 4.0–10.5)
nRBC: 0 % (ref 0.0–0.2)

## 2022-08-10 LAB — CULTURE, BLOOD (ROUTINE X 2)
Culture: NO GROWTH
Culture: NO GROWTH

## 2022-08-10 MED ORDER — OXYCODONE HCL 5 MG PO TABS
5.0000 mg | ORAL_TABLET | Freq: Once | ORAL | Status: AC
  Administered 2022-08-10: 5 mg via ORAL
  Filled 2022-08-10: qty 1

## 2022-08-10 MED ORDER — ANTICOAGULANT SODIUM CITRATE 4% (200MG/5ML) IV SOLN
5.0000 mL | Freq: Once | Status: AC
  Administered 2022-08-10: 5 mL
  Filled 2022-08-10: qty 5

## 2022-08-10 MED ORDER — DARBEPOETIN ALFA 200 MCG/0.4ML IJ SOSY: 200.0000 ug | PREFILLED_SYRINGE | INTRAMUSCULAR | Status: DC

## 2022-08-10 NOTE — Progress Notes (Signed)
Received patient in bed to unit.  Alert and oriented.  Informed consent signed and in chart.   Treatment initiated: 0905 Treatment completed: 1240  Patient tolerated well.  Transported back to the room  Alert, without acute distress.  Hand-off given to patient's nurse.   Access used: Catheter  Access issues: none  Total UF removed: 5.0L Medication(s) given: Ferric Gluconate Post HD VS: 113/55,89,22,100% Post HD weight: 184.7kg   [facility]

## 2022-08-10 NOTE — Procedures (Signed)
Patient seen and examined on Hemodialysis. BP (!) 98/55 (BP Location: Left Arm)   Pulse (!) 105   Temp 98 F (36.7 C) (Oral)   Resp 18   Ht 6' (1.829 m)   Wt (!) 189.7 kg   SpO2 96%   BMI 56.72 kg/m   QB 400 mL/ min via TDC, UF goal 4L  Tolerating treatment without complaints at this time.  Modified orders to UF only.    Madelon Lips MD [facility] [phone]  9:26 AM

## 2022-08-10 NOTE — Progress Notes (Signed)
Physical Therapy Treatment Patient Details Name: Phillip Young MRN: [id number] DOB: 02-04-1986 Today's Date: 08/10/2022   History of Present Illness Pt is a 37 yo male admitted with volume overload and acute on chronic CHF. 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 This is pt's 4th hospitalization since Sept. Pt on 6 L O2 at home.    PT Comments    Pt needed some encouragement to participate initially but was then very motivated and expressed that he did more than he thought he was capable of performing. Pt HR and O2 sats remained WNL throughout session on 4L O2 via [place]. Pt was able to increase gait distance this session and is requiring CGA to supervision assistance for all mobility. Due to pt current functional status, home set up and available assistance at home recommending skilled physical therapy services in HHPT setting on discharge from acute care hospital in order to decrease risk for immobility, falls and re-hospitalization.     Recommendations for follow up therapy are one component of a multi-disciplinary discharge planning process, led by the attending physician.  Recommendations may be updated based on patient status, additional functional criteria and insurance authorization.  Follow Up Recommendations  Home health PT     Assistance Recommended at Discharge Intermittent Supervision/Assistance  Patient can return home with the following A little help with walking and/or transfers;Assistance with cooking/housework;Help with stairs or ramp for entrance;Assist for transportation   Equipment Recommendations  Other (comment) (bariatric rollator with O2 attachment)    Recommendations for Other Services       Precautions / Restrictions Precautions Precautions: Fall Restrictions Weight Bearing Restrictions: No Other Position/Activity Restrictions: Pt morbidly obese with swelling.     Mobility  Bed  Mobility Overal bed mobility: Modified Independent Bed Mobility: Supine to Sit     Supine to sit: Modified independent (Device/Increase time), HOB elevated     General bed mobility comments: Pt did not need any assistance to get to sitting EOB. Patient Response: Cooperative  Transfers Overall transfer level: Needs assistance Equipment used: Rolling walker (2 wheels) Transfers: Sit to/from Stand Sit to Stand: Min guard           General transfer comment: pt was fatigued from dialysis. Initially on standing pt states he felt light headed and was leaning on RW.    Ambulation/Gait Ambulation/Gait assistance: Min guard Gait Distance (Feet): 50 Feet (20 ft initially) Assistive device: Rolling walker (2 wheels) Gait Pattern/deviations: Step-through pattern, Decreased step length - right, Decreased step length - left Gait velocity: decreased cadence. Gait velocity interpretation: <1.31 ft/sec, indicative of household ambulator   General Gait Details: Slight sway in the frontal plane, initially resting on RW intermittently due to fatigue, lightheadedness, tinnitus initially for first 20 ft. vitals WNL. On 4 L O2 via [place]. Pt then performed 50 ft of gait and wanted to walk further but was starting to look fatigued when back to EOB tinnitus started again. Improved with rest. O2 sats 98%, HR 111       Balance Overall balance assessment: Needs assistance Sitting-balance support: Feet supported Sitting balance-Leahy Scale: Good     Standing balance support: During functional activity, Bilateral upper extremity supported Standing balance-Leahy Scale: Fair Standing balance comment: Slight frontal plane sway. Initially leaning on walker to help with dizziness which improved significantly on second attempt          Cognition Arousal/Alertness: Awake/alert Behavior During Therapy: [facility] for tasks assessed/performed Overall Cognitive  Status: Within Functional Limits for tasks assessed              General Comments General comments (skin integrity, edema, etc.): Pt initially stated he could not get up and move. Both dressings on bil ankles had fallen off and pt stated he had significant decrease in swelling in bil feet. Pt educated on lymphatic flow and ankle pumps. LIght massage at [place] for fluid flow; pt reports he felt tingling following very brief fluid facilitation. Bil ankles re-wrapped with nurse stating this was okay. Following some encouargement pt then agreeable to mobility and motivated to move. Once standing pt stated he thought he could walk and was able to ambulate. He was limited by lightheadedness and tinnitus which improved wtih rest. Pt then wanted to ambulate again with significant improvement and less lightheadedness/tinnitus. Encouarged to pace activities after 50 ft of gait.      Pertinent Vitals/Pain Pain Assessment Pain Assessment: Faces Faces Pain Scale: Hurts even more Negative Vocalization: occasional moan/groan, low speech, negative/disapproving quality Facial Expression: smiling or inexpressive Body Language: relaxed Pain Location: B LES Pain Descriptors / Indicators: Discomfort, Grimacing, Guarding, Throbbing, Tightness Pain Intervention(s): Limited activity within patient's tolerance, Monitored during session     PT Goals (current goals can now be found in the care plan section) Acute Rehab PT Goals Patient Stated Goal: to go home PT Goal Formulation: With patient Time For Goal Achievement: 08/20/22 Potential to Achieve Goals: Good Progress towards PT goals: Progressing toward goals    Frequency    Min 3X/week      PT Plan Current plan remains appropriate       AM-PAC PT "6 Clicks" Mobility   Outcome Measure  Help needed turning from your back to your side while in a flat bed without using bedrails?: None Help needed moving from lying on your back to sitting on the side of a flat bed without using bedrails?: None Help  needed moving to and from a bed to a chair (including a wheelchair)?: A Little Help needed standing up from a chair using your arms (e.g., wheelchair or bedside chair)?: A Little Help needed to walk in hospital room?: A Little Help needed climbing 3-5 steps with a railing? : A Lot 6 Click Score: 19    End of Session Equipment Utilized During Treatment: Gait belt;Oxygen Activity Tolerance: Patient limited by fatigue Patient left: with call bell/phone within reach;in bed Nurse Communication: Mobility status PT Visit Diagnosis: Muscle weakness (generalized) (M62.81)     Time: 3154-0086 PT Time Calculation (min) (ACUTE ONLY): 60 min  Charges:  $Gait Training: 23-37 mins $Therapeutic Activity: 23-37 mins                     Tomma Rakers, DPT, [place] Office: [phone] (Secure chat preferred)    Ander Purpura 08/10/2022, 4:15 PM

## 2022-08-10 NOTE — Progress Notes (Signed)
Notified Dr. Posey Pronto that Pts. heart rate jumped up 150's-160's when he sat up in bed to rub his feet. Now laying back down HR 120's-130's. Pt. denies CP or dyspnea, but endorses palpitations.

## 2022-08-10 NOTE — Progress Notes (Signed)
Pt. States he will notify when he is ready for his bipap.

## 2022-08-10 NOTE — Progress Notes (Signed)
[facility] Progress Note   Assessment/ Plan:   Dialysis Orders: TTS [facility] 4h 18min  425/ 1.5  180kg  3K/2.5Ca bath  RIJ TDC  NO HEPARIN - uses citrate for catheter lock solution - last HD at [facility] was 1/11, poast wt was 192 kg, 4 kg removed - prior HD at [facility] was 12/09 and prior to that, 11/25 - CE shows that pt was admitted at [facility] from 07/06/22 to 08/04/22 - venofer 50 weekly - hectorol 6 mcg tiw IV - sensipar 30 mg po tiw - mircera 225 mcg IV q2, last on 1/11, due on 1/25   Assessment/Plan: A/C hypoxic resp failure - on higher O2 here than usually uses at home, but no edema on CXR, +vasc congestion. Obvious vol overload in terms of LE edema. Needs daily dialysis for max fluid removal HFrEF - EF 25%, per pmd, also followed by the CHF team ESRD - on HD TTS. Pt has been IP at [facility] 07/06/22-08/04/22 for acute hypoxic respiratory failure. Per discharge summary, patient was dismissed from previous hospitalization due to aggressive behavior and ongoing medical and hemodialysis non-compliance. Spoke with Dr. [name] 1/12 who recommends daily HD while hospitalized.  Will do 4x weekly dialysis and 2x weekly UF only for max fluid removal- so will have 6 treatments/ week this week, just modified slightly to maximize KT/V and fluid removal  HTN/ volume - 12kg over dry weight as of today. Obvious anasarca as above Anemia esrd - Hgb 6.6 1/13, declined a blood transfusion. 6.7 this AM.  Continue to follow trend closely and will continue to discuss with patient if a transfusion is indicated. Patient with history of HIT so no heparin products. Using 4% sodium citrate for catheter lock solution (per outpatient HD records). Iron deficient- getting ferrlicet x 4 now  Aranesp 200 mcg 08/09/22- modified for weekly. MBD ckd - Cca and PO4 in range. Says he is only taking 1000mg  of fosrenol with meals as phos binder. Will continue Fosrenol, IV vdra, and senispar.  Afib/A-flutter - followed by Cardiology: hx  DCCVs, s/p ablation 12/23 at [facility], amiodarone stopped d/t possible amiodarone pulmonary toxicity, on Eliquis. Possible amiodarone toxicity - seen by pulmonary 11/23 (started on Prednisone during that time), amiodarone stopped Chron resp failure - due to MO/ OSA/ OHS Dispo: if we can make headway in fluids I should have a better idea of potential discharge possibility later this week  Subjective:    Seen on HD- discussed daily dialysis.     Objective:   BP (!) 98/55 (BP Location: Left Arm)   Pulse (!) 105   Temp 98 F (36.7 C) (Oral)   Resp 18   Ht 6' (1.829 m)   Wt (!) 189.7 kg   SpO2 96%   BMI 56.72 kg/m   Physical Exam: Gen: NAD, sitting in bed CVS: RRR Resp: clear anteriorly Abd: soft Ext: 3+, worse in feet, seems to be improving somewhat as I can see skin texture.  ACCESSRetta Diones [facility]  Labs: BMET Recent Labs  Lab 08/05/22 1720 08/06/22 0534 08/06/22 1450 08/07/22 0033 08/08/22 0107 08/09/22 0042 08/10/22 0107  NA 132* 132*  --  130* 130* 129* 130*  K 4.1 4.2  --  4.4 4.1 4.3 4.2  CL 96* 95*  --  95* 94* 94* 95*  CO2 21* 23  --  23 24 22 25   GLUCOSE 74 114*  --  106* 99 111* 110*  BUN 25* 32*  --  27* 21*  33* 21*  CREATININE 4.53* 5.39*  --  5.44* 4.95* 7.08* 5.68*  CALCIUM 9.0 8.9  --  8.9 8.9 8.9 8.9  PHOS  --   --  4.3 3.9 3.8 4.9*  --    CBC Recent Labs  Lab 08/07/22 0033 08/08/22 0107 08/09/22 0042 08/10/22 0107  WBC 7.4 5.7 6.2 6.4  NEUTROABS 5.2  --  4.2  --   HGB 6.6* 7.1* 6.7* 6.8*  HCT 21.2* 22.1* 20.9* 21.2*  MCV 107.6* 105.7* 106.1* 105.0*  PLT 113* 135* 170 201      Medications:     apixaban  5 mg Oral BID   Chlorhexidine Gluconate Cloth  6 each Topical Q0600   cinacalcet  30 mg Oral Q T,Th,Sa-HD   [START ON 08/16/2022] darbepoetin (ARANESP) injection - DIALYSIS  200 mcg Subcutaneous Q Mon-1800   doxercalciferol  6 mcg Intravenous Q T,Th,Sa-HD   doxycycline  100 mg Oral Q12H   fluticasone furoate-vilanterol  1 puff Inhalation  Daily   lanthanum  1,000 mg Oral TID WC   melatonin  5 mg Oral QHS     Madelon Lips, MD 08/10/2022, 9:16 AM

## 2022-08-10 NOTE — Progress Notes (Signed)
HD#3 SUBJECTIVE:  Patient Summary: Phillip Young is a 37 y.o. with a pertinent PMH of EF 20 to 25%, A-fib-a flutter, ESRD on HD, and OSA, who presented with lower extremity edema and shortness of breath and admitted on 1/11 for acute on chronic heart failure exacerbation.   Overnight Events: Sat up to rub feet overnight. Endorsed palpitations. Sinus tachycardia, 150-160s. Had aching in legs and got oxy 5 mg.  Interim History: Patient states he feels better today in terms of his pain and his shortness of breath. States he still would not like a blood transfusion despite our recommendation. Denies chest pain. Endorses pain at ankles/feet that improves with "massaging" his edema.  OBJECTIVE:  Vital Signs: Vitals:   08/10/22 0834 08/10/22 0851 08/10/22 0855 08/10/22 0905  BP:   (!) 113/35 (!) 98/55  Pulse:   (!) 102 (!) 105  Resp:   (!) 21 18  Temp:   98 F (36.7 C)   TempSrc:   Oral   SpO2: 94%  (!) 89% 96%  Weight:  (!) 189.7 kg    Height:       Supplemental O2: Nasal Cannula SpO2: 96 % O2 Flow Rate (L/min): 3 L/min FiO2 (%): 40 %  Filed Weights   08/09/22 0743 08/10/22 0433 08/10/22 0851  Weight: (!) 191.7 kg (!) 191.1 kg (!) 189.7 kg     Intake/Output Summary (Last 24 hours) at 08/10/2022 1027 Last data filed at 08/10/2022 0831 Gross per 24 hour  Intake 1490 ml  Output 5200 ml  Net -3710 ml   Net IO Since Admission: -5,778 mL [08/10/22 1027]  Physical Exam: Physical Exam Constitutional:      Appearance: He is obese.  Cardiovascular:     Rate and Rhythm: Regular rhythm. Tachycardia present.     Heart sounds: No murmur heard.    No friction rub. No gallop.  Pulmonary:     Effort: Pulmonary effort is normal.  Abdominal:     Palpations: Abdomen is soft.     Tenderness: There is no abdominal tenderness.  Musculoskeletal:     Right lower leg: Tenderness present. Edema (2+) present.     Left lower leg: Tenderness present. Edema (2+) present.  Skin:    General:  Skin is warm and dry.  Neurological:     General: No focal deficit present.     Mental Status: He is alert.  Psychiatric:        Mood and Affect: Mood normal.        Behavior: Behavior normal.       Latest Ref Rng & Units 08/10/2022    1:07 AM 08/09/2022   12:42 AM 08/08/2022    1:07 AM  CBC  WBC 4.0 - 10.5 K/uL 6.4  6.2  5.7   Hemoglobin 13.0 - 17.0 g/dL 6.8  6.7  7.1   Hematocrit 39.0 - 52.0 % 21.2  20.9  22.1   Platelets 150 - 400 K/uL 201  170  135       Latest Ref Rng & Units 08/10/2022    1:07 AM 08/09/2022   12:42 AM 08/08/2022    1:07 AM  BMP  Glucose 70 - 99 mg/dL 110  111  99   BUN 6 - 20 mg/dL 21  33  21   Creatinine 0.61 - 1.24 mg/dL 5.68  7.08  4.95   Sodium 135 - 145 mmol/L 130  129  130   Potassium 3.5 - 5.1 mmol/L 4.2  4.3  4.1   Chloride 98 - 111 mmol/L 95  94  94   CO2 22 - 32 mmol/L 25  22  24    Calcium 8.9 - 10.3 mg/dL 8.9  8.9  8.9      Patient Lines/Drains/Airways Status     Active Line/Drains/Airways     Name Placement date Placement time Site Days   Peripheral IV 08/05/22 20 G Right Hand 08/05/22  1818  Hand  4   Hemodialysis Catheter Right Internal jugular Double lumen Permanent (Tunneled) 08/20/21  1324  Internal jugular  354   Wound / Incision (Open or Dehisced) 08/05/22 Ankle Anterior;Right;Lateral open wound wrapped with abd and kerlix. weeping 08/05/22  --  Ankle  4   Wound / Incision (Open or Dehisced) 08/05/22 Ankle Anterior;Left;Lateral open wounds wrapped with abd and kerlix, weeping 08/05/22  --  Ankle  4   Wound / Incision (Open or Dehisced) 08/06/22 Skin tear Thigh Posterior;Proximal;Right 08/06/22  --  Thigh  3             ASSESSMENT/PLAN:  Assessment: Principal Problem:   Acute on chronic hypoxic respiratory failure ([name]) Active Problems:   OSA (obstructive sleep apnea)   Hidradenitis suppurativa   Paroxysmal atrial flutter ([name])   ESRD (end stage renal disease) on dialysis ([name])   Acute on chronic respiratory failure  with hypoxia ([name])   Acute exacerbation of CHF (congestive heart failure) ([name])   Acute on chronic diastolic heart failure ([place])   Plan: Acute on chronic hypoxic respiratory failure, improving Acute on chronic HFrEF with EF 20 to 25%, improving Weight is -2.7kg from admission and he is -6.3L since admission. He has undergone serial HD sessions to help with volume overload. AHF team signed off as he is stable from a cardiac perspective. He is recommended to undergo daily HD sessions while hospitalized. He is not a candidate for advanced therapies per cardiology unfortunately. Will likely need dialysis 4 days a week as outpatient for volume control. Sats improved, now at 96% on 3L which is at baseline. Lower extremity edema modestly improved today. -Continue serial HD sessions -Supplemental O2, goal sat >92% -Daily weights -Strict I and Os -Encouraged patient to get OOB with mobility tech and to keep bilateral LE elevated to encourage further reduction in LE edema.  -Cardiology consulted and now signed off, appreciate their assistance -Nephrology consulted, appreciate their assistance   Anemia of chronic disease Iron deficiency anemia Hemoglobin remains low but stable, 6.8 this morning. He does not want blood transfusion unless Hgb <6 or he has symptoms - he states he SOB is improved today though we did discuss that he was tachycardic last night. Our team explained that his feelings of shortness of breath are likely being made worse by his anemia. I reiterated that the normal threshold for blood transfusion is hemoglobin <7 and that we recommend blood transfusion, but he declines again today. -Daily CBC -aranesp   ESRD Plan is for daily HD while hospitalized. He receives HD Tuesday Thursday Saturday in the outpatient setting. He was dismissed from prior hospitalization in which he had acute hypoxic respiratory failure secondary to volume overload due to aggressive behavior and ongoing  medical/HD nonadherence.   -daily HD  Thrombocytopenia Platelets improved again today to 201. He has a history of HIT but has not received heparin products during this admission. -Daily CBC   OSA/ OHS -Nightly CPAP  Paroxysmal atrial fibrillation/flutter Patient has had ablation completed. He remains high risk for returning to abnormal  rhythm. He has been intermittently refusing his eliquis due to concerns that his anemia is due to the medication. Our team has counseled him extensively regarding eliquis. Metoprolol has been recommended in the past but patient declines as he believes he is allergic.Tachycardic with palpitations last night but ECG with sinus tachycardia.  -Continue Eliquis 5 mg BID -Continue telemetry monitoring   Bilateral malleolus wounds secondary to chronic edema Unchanged, secondary to now-improving lower extremity edema. -Oxycodone on 5 mg q6h PRN severe pain -Patient educated on elevation of LE and ambulation to assist in further reduction of LE edema which is more likely than not the main driver of his pain   History of hidradenitis suppurativa -Continued home doxycycline 100 mg BID  Best Practice: Diet: Renal diet with 1200 mL fluid restriction IVF: None Code: Full DISPO: Anticipated discharge to Home pending  medical stability .  Signature: Linward Natal, MD Internal Medicine Resident, PGY-1 [facility]  Pager: [phone]   Please contact the on call pager after 5 pm and on weekends at [phone].

## 2022-08-10 NOTE — Progress Notes (Signed)
Unable to wean Pts. Oxygen during prior shift. Pt. Reported to this RN during nightshift that he does not tolerate his oxygen being titrated down and "don't touch my oxygen."

## 2022-08-10 NOTE — Progress Notes (Signed)
OT Cancellation Note  Patient Details Name: Phillip Young MRN: [id number] DOB: 1986-06-08   Cancelled Treatment:    Reason Eval/Treat Not Completed: Patient at procedure or test/ unavailable (per RN pt in HD, will follow up for OT tx as schedule permits)  Renaye Rakers, OTD, OTR/L SecureChat Preferred Acute Rehab [phone] - [place] 08/10/2022, 9:02 AM

## 2022-08-11 DIAGNOSIS — J9621 Acute and chronic respiratory failure with hypoxia: Secondary | ICD-10-CM | POA: Diagnosis not present

## 2022-08-11 DIAGNOSIS — Z87891 Personal history of nicotine dependence: Secondary | ICD-10-CM | POA: Diagnosis not present

## 2022-08-11 LAB — BASIC METABOLIC PANEL
Anion gap: 14 (ref 5–15)
BUN: 29 mg/dL — ABNORMAL HIGH (ref 6–20)
CO2: 22 mmol/L (ref 22–32)
Calcium: 9.2 mg/dL (ref 8.9–10.3)
Chloride: 92 mmol/L — ABNORMAL LOW (ref 98–111)
Creatinine, Ser: 7.8 mg/dL — ABNORMAL HIGH (ref 0.61–1.24)
GFR, Estimated: 8 mL/min — ABNORMAL LOW (ref >60)
Glucose, Bld: 100 mg/dL — ABNORMAL HIGH (ref 70–99)
Potassium: 4.5 mmol/L (ref 3.5–5.1)
Sodium: 128 mmol/L — ABNORMAL LOW (ref 135–145)

## 2022-08-11 LAB — CBC
HCT: 22.6 % — ABNORMAL LOW (ref 39.0–52.0)
Hemoglobin: 7.1 g/dL — ABNORMAL LOW (ref 13.0–17.0)
MCH: 33.3 pg (ref 26.0–34.0)
MCHC: 31.4 g/dL (ref 30.0–36.0)
MCV: 106.1 fL — ABNORMAL HIGH (ref 80.0–100.0)
Platelets: 226 10*3/uL (ref 150–400)
RBC: 2.13 MIL/uL — ABNORMAL LOW (ref 4.22–5.81)
RDW: 15.6 % — ABNORMAL HIGH (ref 11.5–15.5)
WBC: 7.2 10*3/uL (ref 4.0–10.5)
nRBC: 0 % (ref 0.0–0.2)

## 2022-08-11 MED ORDER — CALAMINE EX LOTN
1.0000 | TOPICAL_LOTION | Freq: Two times a day (BID) | CUTANEOUS | Status: DC
  Administered 2022-08-11: 1 via TOPICAL
  Filled 2022-08-11 (×2): qty 177

## 2022-08-11 MED ORDER — ALUM & MAG HYDROXIDE-SIMETH 200-200-20 MG/5ML PO SUSP
15.0000 mL | Freq: Four times a day (QID) | ORAL | Status: DC | PRN
  Administered 2022-08-11 – 2022-08-12 (×2): 15 mL via ORAL
  Filled 2022-08-11 (×2): qty 30

## 2022-08-11 NOTE — Progress Notes (Signed)
   08/11/22 1000  Mobility  Activity Ambulated with assistance in hallway  Level of Assistance Standby assist, set-up cues, supervision of patient - no hands on  Assistive Device Front wheel walker  Distance Ambulated (ft) 340 ft  Activity Response Tolerated well  Mobility Referral Yes  $Mobility charge 1 Mobility   Mobility Specialist Progress Note  Pre-Mobility: 110 HR, 95% SpO2(3L) During Mobility: 121-113 HR, 86-97% SpO2 Post-Mobility: 106 HR, 92% SpO2  Pt was in bed and agreeable. X5 standing breaks d/t SOB but recovered after being coached through pursed lip breathing. Had c/o of foot pain after ambulation. Left EOB w/ call bell in reach and all needs met. RN notified.   Lucious Groves Mobility Specialist  Please contact via SecureChat or Rehab office at [phone]

## 2022-08-11 NOTE — Progress Notes (Signed)
Physical Therapy Treatment Patient Details Name: Kilian Schwartz MRN: [id number] DOB: 08-09-1985 Today's Date: 08/11/2022   History of Present Illness Pt is a 37 yo male admitted with volume overload and acute on chronic CHF. 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 This is pt's 4th hospitalization since Sept. Pt on 6 L O2 at home.    PT Comments    Pt ambulated earlier and reports feet burning/stiff/edema.  Pt request massage for edema.  Performed 5 mins each foot ankle soft tissue mobilization and joint mobilization (see comments) - reports improvement and was able to ambulate 320' in hallway with several rest breaks.  Required 6 L O2 activity, rest breaks, and cues to breath in nose.  Back to 4 L at rest.     Recommendations for follow up therapy are one component of a multi-disciplinary discharge planning process, led by the attending physician.  Recommendations may be updated based on patient status, additional functional criteria and insurance authorization.  Follow Up Recommendations  Home health PT     Assistance Recommended at Discharge Intermittent Supervision/Assistance  Patient can return home with the following A little help with walking and/or transfers;Assistance with cooking/housework;Help with stairs or ramp for entrance;Assist for transportation   Equipment Recommendations  Other (comment) (bariatrc rollator)    Recommendations for Other Services       Precautions / Restrictions Precautions Precautions: Fall     Mobility  Bed Mobility Overal bed mobility: Modified Independent, Needs Assistance Bed Mobility: Supine to Sit, Sit to Supine     Supine to sit: Modified independent (Device/Increase time), HOB elevated Sit to supine: Supervision   General bed mobility comments: HOB elevated; use of momentum    Transfers Overall transfer level: Needs assistance Equipment used: Rolling walker (2  wheels) Transfers: Sit to/from Stand Sit to Stand: Supervision           General transfer comment: Performed x 2    Ambulation/Gait Ambulation/Gait assistance: Supervision Gait Distance (Feet): 320 Feet Assistive device: Rolling walker (2 wheels) Gait Pattern/deviations: Step-through pattern, Wide base of support, Decreased stride length Gait velocity: decreased     General Gait Details: Pt took 6 standing rest breaks; min guard for safety; fatigued easily; cues for breathing in nose.  Pt was on 4 L O2 rest with sats 94%.  Started ambulation on 4 L with sats 84%.  Increased to 6 L and rest with sats to 92%.  Then ambulated on 6 L with sats dropping to 86%, improved with rest breaks.  At end of session able to decrease back to 4 L with sats 93%   Stairs             Wheelchair Mobility    Modified Rankin (Stroke Patients Only)       Balance Overall balance assessment: Needs assistance Sitting-balance support: Feet supported Sitting balance-Leahy Scale: Good     Standing balance support: During functional activity, Bilateral upper extremity supported Standing balance-Leahy Scale: Fair Standing balance comment: could stand without UE support but RW to ambulate                            Cognition Arousal/Alertness: Awake/alert Behavior During Therapy: WFL for tasks assessed/performed Overall Cognitive Status: Within Functional Limits for tasks assessed  Exercises      General Comments General comments (skin integrity, edema, etc.): Pt with c/o swelling and stiffness in bil feet.  Requested soft tissue mobilization prior to ambulation.  Performed 5 mins each foot ankle soft tissue mobilization and joint mobilization metatarsals and talocrual joint.  Pt reports feet feel much better.      Pertinent Vitals/Pain Pain Assessment Pain Assessment: Faces Faces Pain Scale: Hurts a little bit Pain  Location: B feet Pain Descriptors / Indicators: Discomfort, Burning Pain Intervention(s): Limited activity within patient's tolerance, Monitored during session, Repositioned, Other (comment) (soft tissue mobilization)    Home Living                          Prior Function            PT Goals (current goals can now be found in the care plan section) Progress towards PT goals: Progressing toward goals    Frequency    Min 3X/week      PT Plan Current plan remains appropriate    Co-evaluation              AM-PAC PT "6 Clicks" Mobility   Outcome Measure  Help needed turning from your back to your side while in a flat bed without using bedrails?: None Help needed moving from lying on your back to sitting on the side of a flat bed without using bedrails?: None Help needed moving to and from a bed to a chair (including a wheelchair)?: A Little Help needed standing up from a chair using your arms (e.g., wheelchair or bedside chair)?: A Little Help needed to walk in hospital room?: A Little Help needed climbing 3-5 steps with a railing? : A Little 6 Click Score: 20    End of Session Equipment Utilized During Treatment: Gait belt;Oxygen Activity Tolerance: Patient tolerated treatment well Patient left: with call bell/phone within reach;in bed Nurse Communication: Mobility status PT Visit Diagnosis: Muscle weakness (generalized) (M62.81)     Time: 0177-9390 PT Time Calculation (min) (ACUTE ONLY): 36 min  Charges:  $Gait Training: 8-22 mins $Therapeutic Activity: 8-22 mins                     Abran Richard, PT Acute Rehab [facility] [place] 08/11/2022, 6:14 PM

## 2022-08-11 NOTE — Progress Notes (Signed)
Attempted to call for patient so that he could come for dialysis. Patient refused this morning and again this evening. Patient told inpatient RN  that his "labs looks good" and he would rather wait till tomorrow.

## 2022-08-11 NOTE — Progress Notes (Signed)
Occupational Therapy Treatment Patient Details Name: Phillip Young MRN: [id number] DOB: April 26, 1986 Today's Date: 08/11/2022   History of present illness Pt is a 37 yo male admitted with volume overload and acute on chronic CHF. 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 This is pt's 4th hospitalization since Sept. Pt on 6 L O2 at home.   OT comments  Pt making slow but steady progress toward adl goals. Pt resistant to sock aid right now due to ankle wounds and therapist agrees. Feel introducing a reacher would be helpful for LE dressing. Pt steadier on feet walking to sink and bathroom for grooming and toileting.  Will continue to follow and focus on LE dressing.    Recommendations for follow up therapy are one component of a multi-disciplinary discharge planning process, led by the attending physician.  Recommendations may be updated based on patient status, additional functional criteria and insurance authorization.    Follow Up Recommendations  No OT follow up     Assistance Recommended at Discharge Intermittent Supervision/Assistance  Patient can return home with the following  A little help with walking and/or transfers;A little help with bathing/dressing/bathroom;Assistance with cooking/housework;Assist for transportation;Help with stairs or ramp for entrance   Equipment Recommendations  None recommended by OT    Recommendations for Other Services      Precautions / Restrictions Precautions Precautions: Fall Restrictions Weight Bearing Restrictions: No Other Position/Activity Restrictions: Pt morbidly obese with swelling. B wounds to ankles dressed with xeroform and gauze.       Mobility Bed Mobility Overal bed mobility: Modified Independent             General bed mobility comments: No physical assist given. HOB up. Pt would not agree to attemting to get up with bed flat.    Transfers Overall  transfer level: Needs assistance Equipment used: Rolling walker (2 wheels) Transfers: Sit to/from Stand Sit to Stand: Supervision     Step pivot transfers: Supervision     General transfer comment: Pt got up without walker but requested walker while linens being changed and had to stand for appx 2 minutes.     Balance Overall balance assessment: Needs assistance Sitting-balance support: Feet supported Sitting balance-Leahy Scale: Good     Standing balance support: During functional activity, Bilateral upper extremity supported Standing balance-Leahy Scale: Fair Standing balance comment: Pt stood bedside for appx 1 min with supervision without walker and then requested walker for fatigue.                           ADL either performed or assessed with clinical judgement   ADL Overall ADL's : Needs assistance/impaired Eating/Feeding: Independent;Sitting Eating/Feeding Details (indicate cue type and reason): cues needed to follow fluid restrictions Grooming: Wash/dry hands;Wash/dry face;Oral care;Supervision/safety;Standing Grooming Details (indicate cue type and reason): Pt stood at sink to stand. Upper Body Bathing: Set up;Sitting   Lower Body Bathing: Minimal assistance;Sit to/from stand;Cueing for compensatory techniques Lower Body Bathing Details (indicate cue type and reason): min assist to get to LEs. Pt standing with supervision to wash backside.         Toilet Transfer: Ambulation;Comfort height toilet;Min guard;Grab bars Toilet Transfer Details (indicate cue type and reason): Pt can walk to bathroom to toilet with walker and supervision. Recommend pt walk to bathroom daily to increase mobilty in room. Nursing notified. Toileting- Clothing Manipulation and Hygiene: Supervision/safety;Sit to/from stand  Functional mobility during ADLs: Supervision/safety;Rolling walker (2 wheels);Cueing for safety General ADL Comments: Pt gets SOB and wanting to up O2  ( on 4 L). Pt O2 sats remained above 90% throughout session.    Extremity/Trunk Assessment Upper Extremity Assessment Upper Extremity Assessment: Overall WFL for tasks assessed   Lower Extremity Assessment Lower Extremity Assessment: Defer to PT evaluation        Vision   Vision Assessment?: No apparent visual deficits   Perception Perception Perception: Within Functional Limits   Praxis Praxis Praxis: Intact    Cognition Arousal/Alertness: Awake/alert Behavior During Therapy: WFL for tasks assessed/performed Overall Cognitive Status: Within Functional Limits for tasks assessed                                 General Comments: Intact but pt very impulsive with all mobility by nature. Pt asking not to wear safety socks but wanting to stand on blanket on floor which can slip when moving.        Exercises      Shoulder Instructions       General Comments Pt swelling in LEs improved since admission but pt continues to request fluids more than the limit.  Educated pt on  need for fluid restrictions. Pt mobilizing more safely in room with all adls.    Pertinent Vitals/ Pain       Pain Assessment Pain Assessment: 0-10 Pain Score: 5  Pain Location: B feet Pain Descriptors / Indicators: Discomfort, Grimacing, Guarding, Throbbing, Tightness Pain Intervention(s): Limited activity within patient's tolerance, Monitored during session, Premedicated before session, Repositioned, Other (comment) (dressings changed)  Home Living                                          Prior Functioning/Environment              Frequency  Min 2X/week        Progress Toward Goals  OT Goals(current goals can now be found in the care plan section)  Progress towards OT goals: Progressing toward goals  Acute Rehab OT Goals Patient Stated Goal: to get better and home OT Goal Formulation: With patient Time For Goal Achievement: 08/20/22 Potential to  Achieve Goals: Good  Plan Discharge plan remains appropriate    Co-evaluation                 AM-PAC OT "6 Clicks" Daily Activity     Outcome Measure   Help from another person eating meals?: None Help from another person taking care of personal grooming?: None Help from another person toileting, which includes using toliet, bedpan, or urinal?: A Little Help from another person bathing (including washing, rinsing, drying)?: A Little Help from another person to put on and taking off regular upper body clothing?: None Help from another person to put on and taking off regular lower body clothing?: A Lot 6 Click Score: 20    End of Session Equipment Utilized During Treatment: Rolling walker (2 wheels);Oxygen  OT Visit Diagnosis: Unsteadiness on feet (R26.81)   Activity Tolerance Patient limited by fatigue   Patient Left in bed;with call bell/phone within reach   Nurse Communication Mobility status;Other (comment) (dressing changes to BLE done due to them falling off before mobility.)        Time: 2355-7322 OT Time Calculation (min):  39 min  Charges: OT General Charges $OT Visit: 1 Visit OT Treatments $Self Care/Home Management : 38-52 mins   Glenford Peers 08/11/2022, 11:05 AM

## 2022-08-11 NOTE — Progress Notes (Signed)
HD#4 SUBJECTIVE:  Patient Summary: Phillip Young is a 37 y.o. with a pertinent PMH of EF 20 to 25%, A-fib-a flutter, ESRD on HD, and OSA, who presented with lower extremity edema and shortness of breath and admitted on 1/11 for acute on chronic heart failure exacerbation.   Overnight Events: NAEO.  Interim History: Patient states he feels like he is getting a cold today. Feeling a little congested. Denies any cough, fevers or chills. He feels like his swelling is better and he has been able to walk some. Plans to do so again today and would like for PT to come by.   OBJECTIVE:  Vital Signs: Vitals:   08/10/22 1230 08/10/22 1311 08/10/22 1948 08/11/22 0250  BP: 119/61 115/67 109/65 (!) 102/46  Pulse:  100 (!) 102 100  Resp:  18 16 18   Temp:  98.2 F (36.8 C) 98.4 F (36.9 C) 98.4 F (36.9 C)  TempSrc:  Oral Oral Oral  SpO2:  94% 95% 99%  Weight:    (!) 186.2 kg  Height:       Supplemental O2: Nasal Cannula, >95% on 3L  Filed Weights   08/10/22 0433 08/10/22 0851 08/11/22 0250  Weight: (!) 191.1 kg (!) 189.7 kg (!) 186.2 kg     Intake/Output Summary (Last 24 hours) at 08/11/2022 0859 Last data filed at 08/11/2022 8502 Gross per 24 hour  Intake 1203 ml  Output 200 ml  Net 1003 ml   Net IO Since Admission: -4,775 mL [08/11/22 0859]  Physical Exam: Physical Exam Constitutional:      Appearance: He is obese.  Cardiovascular:     Rate and Rhythm: Regular rhythm. Tachycardia present.     Heart sounds: No murmur heard. Pulmonary:     Effort: Pulmonary effort is normal.     Breath sounds: No wheezing or rales.  Abdominal:     Tenderness: There is no abdominal tenderness.  Musculoskeletal:     Right lower leg: Tenderness present. Edema (2+) present.     Left lower leg: Tenderness present. Edema (2+) present.  Skin:    General: Skin is warm and dry.  Neurological:     General: No focal deficit present.     Mental Status: He is alert.  Psychiatric:        Mood and  Affect: Mood normal.        Behavior: Behavior normal.       Latest Ref Rng & Units 08/11/2022    1:02 AM 08/10/2022    1:07 AM 08/09/2022   12:42 AM  CBC  WBC 4.0 - 10.5 K/uL 7.2  6.4  6.2   Hemoglobin 13.0 - 17.0 g/dL 7.1  6.8  6.7   Hematocrit 39.0 - 52.0 % 22.6  21.2  20.9   Platelets 150 - 400 K/uL 226  201  170       Latest Ref Rng & Units 08/11/2022    1:02 AM 08/10/2022    1:07 AM 08/09/2022   12:42 AM  BMP  Glucose 70 - 99 mg/dL 100  110  111   BUN 6 - 20 mg/dL 29  21  33   Creatinine 0.61 - 1.24 mg/dL 7.80  5.68  7.08   Sodium 135 - 145 mmol/L 128  130  129   Potassium 3.5 - 5.1 mmol/L 4.5  4.2  4.3   Chloride 98 - 111 mmol/L 92  95  94   CO2 22 - 32 mmol/L 22  25  22   Calcium 8.9 - 10.3 mg/dL 9.2  8.9  8.9      Patient Lines/Drains/Airways Status     Active Line/Drains/Airways     Name Placement date Placement time Site Days   Peripheral IV 08/05/22 20 G Right Hand 08/05/22  1818  Hand  4   Hemodialysis Catheter Right Internal jugular Double lumen Permanent (Tunneled) 08/20/21  1324  Internal jugular  354   Wound / Incision (Open or Dehisced) 08/05/22 Ankle Anterior;Right;Lateral open wound wrapped with abd and kerlix. weeping 08/05/22  --  Ankle  4   Wound / Incision (Open or Dehisced) 08/05/22 Ankle Anterior;Left;Lateral open wounds wrapped with abd and kerlix, weeping 08/05/22  --  Ankle  4   Wound / Incision (Open or Dehisced) 08/06/22 Skin tear Thigh Posterior;Proximal;Right 08/06/22  --  Thigh  3             ASSESSMENT/PLAN:  Assessment: Principal Problem:   Acute on chronic hypoxic respiratory failure ([name]) Active Problems:   OSA (obstructive sleep apnea)   Hidradenitis suppurativa   Paroxysmal atrial flutter ([name])   ESRD (end stage renal disease) on dialysis ([name])   Acute on chronic respiratory failure with hypoxia ([name])   Acute exacerbation of CHF (congestive heart failure) ([name])   Acute on chronic diastolic heart failure  ([place])   Plan: Acute on chronic hypoxic respiratory failure, improving Acute on chronic HFrEF with EF 20 to 25% He has undergone serial HD sessions to help with volume overload. AHF team signed off as he is stable from a cardiac perspective. He is recommended to undergo daily HD sessions with UF while hospitalized. Weight is -7.2kg from admission and he is -5L since admission. He is not a candidate for advanced therapies per cardiology unfortunately. Will likely need dialysis 4 days a week as outpatient for volume control. Now saturating >95% on 3L which is consistent with his baseline.   -Continue serial HD sessions with UF  -Supplemental O2, goal sat >92% -Daily weights -Strict I and Os -Encouraged patient to get OOB with mobility tech and to keep bilateral LE elevated to encourage further reduction in LE edema.  -Cardiology consulted and now signed off, appreciate their assistance -Nephrology consulted, appreciate their assistance   Anemia of chronic disease Iron deficiency anemia Hemoglobin remains low but stable, 7.1 this morning. He does not want blood transfusion unless Hgb <6 or he has symptoms - he states he SOB is improved today though we did discuss that he was tachycardic last night. Our team explained that his feelings of shortness of breath are likely being made worse by his anemia. I reiterated that the normal threshold for blood transfusion is hemoglobin <7 and that we recommend blood transfusion, but he declines again today. -Daily CBC -aranesp, ferrlicit   ESRD Plan is for daily HD while hospitalized. He receives HD Tuesday Thursday Saturday in the outpatient setting. He was dismissed from prior hospitalization in which he had acute hypoxic respiratory failure secondary to volume overload due to aggressive behavior and ongoing medical/HD nonadherence.  -nephrology consulted, appreciate assistance  -daily HD  Thrombocytopenia Platelets improved again today to 226k. He has a  history of HIT and has not received heparin products during this admission. -Daily CBC   OSA/ OHS -Nightly CPAP  Paroxysmal atrial fibrillation/flutter Patient has had ablation completed. He remains high risk for returning to abnormal rhythm. He has been intermittently refusing his eliquis due to concerns that his anemia is due to the medication.  Our team has counseled him extensively regarding eliquis. Metoprolol has been recommended in the past but patient declines as he believes he is allergic. -Continue Eliquis 5 mg BID -Continue telemetry monitoring   Bilateral malleolus wounds secondary to chronic edema Unchanged, secondary to now-improving lower extremity edema. -Oxycodone on 5 mg q6h PRN severe pain -Patient educated on elevation of LE and ambulation to assist in further reduction of LE edema which is more likely than not the main driver of his pain   History of hidradenitis suppurativa -Continued home doxycycline 100 mg BID  Best Practice: Diet: Renal diet with 1200 mL fluid restriction IVF: None Code: Full DISPO: Anticipated discharge to Home pending  medical stability .  Signature: Linward Natal, MD Internal Medicine Resident, PGY-1 [facility]  Pager: [phone]   Please contact the on call pager after 5 pm and on weekends at [phone].

## 2022-08-11 NOTE — Plan of Care (Signed)

## 2022-08-11 NOTE — Progress Notes (Signed)
Pt refuses dialysis today. MD notified.

## 2022-08-11 NOTE — Progress Notes (Signed)
[facility] Progress Note   Assessment/ Plan:   Dialysis Orders: TTS [facility] 4h 11min  425/ 1.5  180kg  3K/2.5Ca bath  RIJ TDC  NO HEPARIN - uses citrate for catheter lock solution - last HD at [facility] was 1/11, poast wt was 192 kg, 4 kg removed - prior HD at [facility] was 12/09 and prior to that, 11/25 - CE shows that pt was admitted at [facility] from 07/06/22 to 08/04/22 - venofer 50 weekly - hectorol 6 mcg tiw IV - sensipar 30 mg po tiw - mircera 225 mcg IV q2, last on 1/11, due on 1/25   Assessment/Plan: A/C hypoxic resp failure - on higher O2 here than usually uses at home, but no edema on CXR, +vasc congestion. Obvious vol overload in terms of LE edema. Needs daily dialysis for max fluid removal HFrEF - EF 25%, per pmd, also followed by the CHF team ESRD - on HD TTS. Pt has been IP at [facility] 07/06/22-08/04/22 for acute hypoxic respiratory failure. Per discharge summary, patient was dismissed from previous hospitalization due to aggressive behavior and ongoing medical and hemodialysis non-compliance. Spoke with Dr. [name] 1/12 who recommends daily HD while hospitalized.  Will do 4x weekly dialysis and 2x weekly UF only for max fluid removal- so will have 6 treatments/ week this week, just modified slightly to maximize KT/V and fluid removal  HTN/ volume - 12kg over dry weight as of today. Obvious anasarca as above Anemia esrd - Hgb 6.6 1/13, declined a blood transfusion. 6.7 this AM.  Continue to follow trend closely and will continue to discuss with patient if a transfusion is indicated. Patient with history of HIT so no heparin products. Using 4% sodium citrate for catheter lock solution (per outpatient HD records). Iron deficient- getting ferrlicet x 4 now  Aranesp 200 mcg 08/09/22- modified for weekly. MBD ckd - Cca and PO4 in range. Says he is only taking 1000mg  of fosrenol with meals as phos binder. Will continue Fosrenol, IV vdra, and senispar.  Afib/A-flutter - followed by Cardiology: hx  DCCVs, s/p ablation 12/23 at [facility], amiodarone stopped d/t possible amiodarone pulmonary toxicity, on Eliquis. Possible amiodarone toxicity - seen by pulmonary 11/23 (started on Prednisone during that time), amiodarone stopped Chron resp failure - due to MO/ OSA/ OHS Dispo: here for daily dialysis- if pt doesn't desire daily dialysis despite risks then can see how he does on 4x weekly dialysis as OP  Subjective:    Declined HD today.  Not adherent to fluid or diet restriction.  Sleeping this AM, doesn't engage in discussion.   Objective:   BP (!) 102/46 (BP Location: Left Arm)   Pulse 100   Temp 98.4 F (36.9 C) (Oral)   Resp 18   Ht 6' (1.829 m)   Wt (!) 186.2 kg   SpO2 99%   BMI 55.67 kg/m   Physical Exam: Gen: NAD, sitting in bed CVS: RRR Resp: clear anteriorly Abd: soft Ext: 3+ anasarca ACCESS: R IJ TDC  Labs: BMET Recent Labs  Lab 08/05/22 1720 08/06/22 0534 08/06/22 1450 08/07/22 0033 08/08/22 0107 08/09/22 0042 08/10/22 0107 08/11/22 0102  NA 132* 132*  --  130* 130* 129* 130* 128*  K 4.1 4.2  --  4.4 4.1 4.3 4.2 4.5  CL 96* 95*  --  95* 94* 94* 95* 92*  CO2 21* 23  --  23 24 22 25 22   GLUCOSE 74 114*  --  106* 99 111* 110* 100*  BUN  25* 32*  --  27* 21* 33* 21* 29*  CREATININE 4.53* 5.39*  --  5.44* 4.95* 7.08* 5.68* 7.80*  CALCIUM 9.0 8.9  --  8.9 8.9 8.9 8.9 9.2  PHOS  --   --  4.3 3.9 3.8 4.9*  --   --    CBC Recent Labs  Lab 08/07/22 0033 08/08/22 0107 08/09/22 0042 08/10/22 0107 08/11/22 0102  WBC 7.4 5.7 6.2 6.4 7.2  NEUTROABS 5.2  --  4.2  --   --   HGB 6.6* 7.1* 6.7* 6.8* 7.1*  HCT 21.2* 22.1* 20.9* 21.2* 22.6*  MCV 107.6* 105.7* 106.1* 105.0* 106.1*  PLT 113* 135* 170 201 226      Medications:     apixaban  5 mg Oral BID   calamine  1 Application Topical BID   Chlorhexidine Gluconate Cloth  6 each Topical Q0600   cinacalcet  30 mg Oral Q T,Th,Sa-HD   [START ON 08/16/2022] darbepoetin (ARANESP) injection - DIALYSIS  200 mcg  Subcutaneous Q Mon-1800   doxercalciferol  6 mcg Intravenous Q T,Th,Sa-HD   doxycycline  100 mg Oral Q12H   fluticasone furoate-vilanterol  1 puff Inhalation Daily   lanthanum  1,000 mg Oral TID WC   melatonin  5 mg Oral QHS     Madelon Lips, MD 08/11/2022, 12:08 PM

## 2022-08-11 NOTE — Progress Notes (Signed)
The patient was made aware that he is on a fluid restriction he continues to insist to request variety of drinks throughtout the night. The patient is AxO x4

## 2022-08-12 DIAGNOSIS — Z87891 Personal history of nicotine dependence: Secondary | ICD-10-CM | POA: Diagnosis not present

## 2022-08-12 DIAGNOSIS — J9621 Acute and chronic respiratory failure with hypoxia: Secondary | ICD-10-CM | POA: Diagnosis not present

## 2022-08-12 LAB — BASIC METABOLIC PANEL
Anion gap: 14 (ref 5–15)
BUN: 38 mg/dL — ABNORMAL HIGH (ref 6–20)
CO2: 22 mmol/L (ref 22–32)
Calcium: 9.3 mg/dL (ref 8.9–10.3)
Chloride: 93 mmol/L — ABNORMAL LOW (ref 98–111)
Creatinine, Ser: 9.92 mg/dL — ABNORMAL HIGH (ref 0.61–1.24)
GFR, Estimated: 6 mL/min — ABNORMAL LOW (ref >60)
Glucose, Bld: 105 mg/dL — ABNORMAL HIGH (ref 70–99)
Potassium: 4.7 mmol/L (ref 3.5–5.1)
Sodium: 129 mmol/L — ABNORMAL LOW (ref 135–145)

## 2022-08-12 LAB — CBC
HCT: 23.4 % — ABNORMAL LOW (ref 39.0–52.0)
Hemoglobin: 7.3 g/dL — ABNORMAL LOW (ref 13.0–17.0)
MCH: 32.9 pg (ref 26.0–34.0)
MCHC: 31.2 g/dL (ref 30.0–36.0)
MCV: 105.4 fL — ABNORMAL HIGH (ref 80.0–100.0)
Platelets: 263 10*3/uL (ref 150–400)
RBC: 2.22 MIL/uL — ABNORMAL LOW (ref 4.22–5.81)
RDW: 15.7 % — ABNORMAL HIGH (ref 11.5–15.5)
WBC: 6.2 10*3/uL (ref 4.0–10.5)
nRBC: 0 % (ref 0.0–0.2)

## 2022-08-12 MED ORDER — ANTICOAGULANT SODIUM CITRATE 4% (200MG/5ML) IV SOLN
5.0000 mL | Freq: Once | Status: AC
  Administered 2022-08-12: 4.2 mL
  Filled 2022-08-12: qty 5

## 2022-08-12 MED ORDER — HEPARIN SODIUM (PORCINE) 1000 UNIT/ML IJ SOLN
INTRAMUSCULAR | Status: AC
  Filled 2022-08-12: qty 5

## 2022-08-12 NOTE — Progress Notes (Signed)
[facility] Progress Note   Assessment/ Plan:   Dialysis Orders: TTS [facility] 4h 102min  425/ 1.5  180kg  3K/2.5Ca bath  RIJ TDC  NO HEPARIN - uses citrate for catheter lock solution - last HD at [facility] was 1/11, poast wt was 192 kg, 4 kg removed - prior HD at [facility] was 12/09 and prior to that, 11/25 - CE shows that pt was admitted at [facility] from 07/06/22 to 08/04/22 - venofer 50 weekly - hectorol 6 mcg tiw IV - sensipar 30 mg po tiw - mircera 225 mcg IV q2, last on 1/11, due on 1/25   Assessment/Plan: A/C hypoxic resp failure - on higher O2 here than usually uses at home, but no edema on CXR, +vasc congestion. Obvious vol overload in terms of LE edema. Needs daily dialysis for max fluid removal HFrEF - EF 25%, per pmd, also followed by the CHF team ESRD - on HD TTS. Pt has been IP at [facility] 07/06/22-08/04/22 for acute hypoxic respiratory failure. Per discharge summary, patient was dismissed from previous hospitalization due to aggressive behavior and ongoing medical and hemodialysis non-compliance. Spoke with Dr. [name] 1/12 who recommends daily HD while hospitalized.  Will do 4x weekly dialysis and 2x weekly UF only for max fluid removal- so will have 6 treatments/ week this week, just modified slightly to maximize KT/V and fluid removal.  He is willing to do HD today so will go for max UF HTN/ volume - 12kg over dry weight as of today. Obvious anasarca as above Anemia esrd - Hgb 6.6 1/13, declined a blood transfusion. 6.7 this AM.  Continue to follow trend closely and will continue to discuss with patient if a transfusion is indicated. Patient with history of HIT so no heparin products. Using 4% sodium citrate for catheter lock solution (per outpatient HD records). Iron deficient- getting ferrlicet x 4 now  Aranesp 200 mcg 08/09/22- modified for weekly. MBD ckd - Cca and PO4 in range. Says he is only taking 1000mg  of fosrenol with meals as phos binder. Will continue Fosrenol, IV vdra, and  senispar.  Afib/A-flutter - followed by Cardiology: hx DCCVs, s/p ablation 12/23 at [facility], amiodarone stopped d/t possible amiodarone pulmonary toxicity, on Eliquis. Possible amiodarone toxicity - seen by pulmonary 11/23 (started on Prednisone during that time), amiodarone stopped Chron resp failure - due to MO/ OSA/ OHS Dispo: here for daily dialysis- if pt doesn't desire daily dialysis despite risks then can see how he does on 4x weekly dialysis as OP  Subjective:    Seen in room.  Willing to go to dialysis today (declined yesterday).     Objective:   BP 106/64 (BP Location: Left Arm)   Pulse (!) 102   Temp 98.5 F (36.9 C) (Oral)   Resp 20   Ht 6' (1.829 m)   Wt (!) 184.9 kg   SpO2 90%   BMI 55.28 kg/m   Physical Exam: Gen: NAD, sitting in bed CVS: RRR Resp: clear anteriorly Abd: soft Ext: 3+ anasarca ACCESS: R IJ [facility]  Labs: BMET Recent Labs  Lab 08/06/22 0534 08/06/22 1450 08/07/22 0033 08/08/22 0107 08/09/22 0042 08/10/22 0107 08/11/22 0102 08/12/22 0101  NA 132*  --  130* 130* 129* 130* 128* 129*  K 4.2  --  4.4 4.1 4.3 4.2 4.5 4.7  CL 95*  --  95* 94* 94* 95* 92* 93*  CO2 23  --  23 24 22 25 22 22   GLUCOSE 114*  --  106* 99 111* 110* 100* 105*  BUN 32*  --  27* 21* 33* 21* 29* 38*  CREATININE 5.39*  --  5.44* 4.95* 7.08* 5.68* 7.80* 9.92*  CALCIUM 8.9  --  8.9 8.9 8.9 8.9 9.2 9.3  PHOS  --  4.3 3.9 3.8 4.9*  --   --   --    CBC Recent Labs  Lab 08/07/22 0033 08/08/22 0107 08/09/22 0042 08/10/22 0107 08/11/22 0102 08/12/22 0101  WBC 7.4   < > 6.2 6.4 7.2 6.2  NEUTROABS 5.2  --  4.2  --   --   --   HGB 6.6*   < > 6.7* 6.8* 7.1* 7.3*  HCT 21.2*   < > 20.9* 21.2* 22.6* 23.4*  MCV 107.6*   < > 106.1* 105.0* 106.1* 105.4*  PLT 113*   < > 170 201 226 263   < > = values in this interval not displayed.      Medications:     apixaban  5 mg Oral BID   calamine  1 Application Topical BID   Chlorhexidine Gluconate Cloth  6 each Topical Q0600    cinacalcet  30 mg Oral Q T,Th,Sa-HD   [START ON 08/16/2022] darbepoetin (ARANESP) injection - DIALYSIS  200 mcg Subcutaneous Q Mon-1800   doxercalciferol  6 mcg Intravenous Q T,Th,Sa-HD   fluticasone furoate-vilanterol  1 puff Inhalation Daily   lanthanum  1,000 mg Oral TID WC   melatonin  5 mg Oral QHS     Madelon Lips, MD 08/12/2022, 12:59 PM

## 2022-08-12 NOTE — Plan of Care (Signed)

## 2022-08-12 NOTE — Progress Notes (Signed)
RN went to completed the patient CHG bath the patient ask RN to "do it later" Pt. A/ox4 refused Eliquis. The patient continue to drink throughout the night.

## 2022-08-12 NOTE — Progress Notes (Signed)
   08/12/22 1200  Mobility  Activity Ambulated with assistance in hallway  Level of Assistance Standby assist, set-up cues, supervision of patient - no hands on  Assistive Device Front wheel walker  Distance Ambulated (ft) 150 ft  Activity Response Tolerated well  Mobility Referral Yes  $Mobility charge 1 Mobility   Mobility Specialist Progress Note  Pre-Mobility: 102 HR, 95% SpO2 During Mobility: 107 HR, 85-94% SpO2 Post-Mobility: 92%  SpO2  Pt was in bed and agreeable. X2 standing breaks d/t SOB but recovered after being coached through pursed lip breathing. Returned EOB w/ all needs met and call bell in reach.   Lucious Groves Mobility Specialist  Please contact via SecureChat or Rehab office at [phone]

## 2022-08-12 NOTE — TOC Progression Note (Signed)
Transition of Care [facility]) - Progression Note    Patient Details  Name: Phillip Young MRN: [id number] Date of Birth: 1986-01-07  Transition of Care [facility]) CM/SW Contact  Erenest Rasher, RN Phone Number: [phone] 08/12/2022, 3:06 PM  Clinical Narrative:     CM received FMLA paperwork from [place] and disability paperwork from [place]. Pt requested CM take paperwork to his Internal Medicine PCP to complete. Paperwork has to be completed before 08/15/2022 for pt to keep his job, disability, and insurance. Gave paperwork to office manager, Tamela Oddi and states she will work on paperwork.   Expected Discharge Plan: Home/Self Care Barriers to Discharge: Continued Medical Work up  Expected Discharge Plan and Services   Discharge Planning Services: CM Consult   Living arrangements for the past 2 months: [place] Determinants of Health (SDOH) Interventions [place]: No Food Insecurity (05/23/2022)  Housing: Low Risk  (05/23/2022)  Transportation Needs: No Transportation Needs (05/23/2022)  Utilities: Not At Risk (05/23/2022)  Depression (PHQ2-9): Low Risk  (04/09/2022)  Financial Resource Strain: Low Risk  (04/13/2021)  Tobacco Use: Medium Risk (08/05/2022)    Readmission Risk Interventions    06/09/2022   12:19 PM 05/12/2021   10:35 AM 10/16/2020    2:10 PM  Readmission Risk Prevention Plan  Transportation Screening Complete Complete Complete  PCP or Specialist Appt within 5-7 Days   Complete  Home Care Screening   Complete  Medication Review (RN CM)   Referral to Pharmacy  Medication Review (RN Care Manager) Complete Complete   PCP or Specialist appointment within 3-5 days of discharge Complete Complete   HRI or Home Care Consult Complete Complete   SW Recovery Care/Counseling Consult Complete Complete   Palliative Care Screening Not Applicable Complete   [place] Not  Applicable Complete

## 2022-08-12 NOTE — Progress Notes (Signed)
Received patient in bed to unit.  Alert and oriented.  Informed consent signed and in chart.   Treatment initiated: 1528 Treatment completed: 1900  Patient tolerated well.  Transported back to the room  Alert, without acute distress.  Hand-off given to patient's nurse.   Access used: dialysis cath Access issues: none  Total UF removed: 3500 Medication(s) given:  Post HD VS: see table below Post HD weight: patient refuse to be weight at this time    08/12/22 1916  Vitals  Temp 98.3 F (36.8 C)  Temp Source Oral  BP 115/68  Pulse Rate 95  ECG Heart Rate 94  Oxygen Therapy  SpO2 99 %  O2 Device Nasal Cannula  O2 Flow Rate (L/min) 4 L/min  During Treatment Monitoring  Intra-Hemodialysis Comments Tolerated well  Post Treatment  Dialyzer Clearance Clear  Duration of HD Treatment -hour(s) 3 hour(s)  Hemodialysis Intake (mL) 0 mL  Liters Processed 72  Fluid Removed (mL) 3.5 mL  Tolerated HD Treatment Yes  Hemodialysis Catheter Right Internal jugular Double lumen Permanent (Tunneled)  Placement Date/Time: 08/20/21 1324   Time Out: Correct patient;Correct site;Correct procedure  Maximum sterile barrier precautions: Hand hygiene;Cap;Mask;Sterile gown;Sterile gloves;Large sterile sheet  Site Prep: (c) Other (comment)  Local Anesthetic...  Site Condition No complications  Blue Lumen Status Flushed;Dead end cap in place  Red Lumen Status Flushed;Dead end cap in place  Catheter fill solution 4% Sodium Citrate  Catheter fill volume (Arterial) 2.1 cc  Catheter fill volume (Venous) 2.1      [facility]

## 2022-08-12 NOTE — Progress Notes (Signed)
HD#5 SUBJECTIVE:  Patient Summary: Phillip Young is a 37 y.o. with a pertinent PMH of EF 20 to 25%, A-fib-a flutter, ESRD on HD, and OSA, who presented with lower extremity edema and shortness of breath and admitted on 1/11 for acute on chronic heart failure exacerbation.   Overnight Events: NAEO.  Interim History: Patient denies chest pain. Feels his shortness of breath is improved. Has been able to ambulate down the hall and feels like he is surpassing his mobility goals. Feels his swelling is improved.   OBJECTIVE:  Vital Signs: Vitals:   08/11/22 1930 08/12/22 0429 08/12/22 0550 08/12/22 0900  BP: 102/60 (!) 107/52  106/64  Pulse: 99 (!) 102    Resp:    20  Temp: 98.6 F (37 C) 98.5 F (36.9 C)    TempSrc: Oral Oral    SpO2: 99% 90%    Weight:   (!) 184.9 kg   Height:       Supplemental O2: Nasal Cannula, >90% on 4L  Filed Weights   08/10/22 0851 08/11/22 0250 08/12/22 0550  Weight: (!) 189.7 kg (!) 186.2 kg (!) 184.9 kg     Intake/Output Summary (Last 24 hours) at 08/12/2022 0957 Last data filed at 08/12/2022 0549 Gross per 24 hour  Intake 472 ml  Output 250 ml  Net 222 ml   Net IO Since Admission: -4,553 mL [08/12/22 0957]  Physical Exam: Physical Exam Constitutional:      Appearance: He is obese.  Cardiovascular:     Rate and Rhythm: Normal rate and regular rhythm.     Heart sounds: No murmur heard. Pulmonary:     Effort: Pulmonary effort is normal.     Breath sounds: No wheezing or rales.  Musculoskeletal:     Right lower leg: Tenderness present. Edema (2+) present.     Left lower leg: Tenderness present. Edema (2+) present.  Skin:    General: Skin is warm and dry.  Neurological:     Mental Status: He is alert and oriented to person, place, and time.  Psychiatric:        Mood and Affect: Mood normal.        Behavior: Behavior normal.       Latest Ref Rng & Units 08/12/2022    1:01 AM 08/11/2022    1:02 AM 08/10/2022    1:07 AM  CBC  WBC 4.0  - 10.5 K/uL 6.2  7.2  6.4   Hemoglobin 13.0 - 17.0 g/dL 7.3  7.1  6.8   Hematocrit 39.0 - 52.0 % 23.4  22.6  21.2   Platelets 150 - 400 K/uL 263  226  201       Latest Ref Rng & Units 08/12/2022    1:01 AM 08/11/2022    1:02 AM 08/10/2022    1:07 AM  BMP  Glucose 70 - 99 mg/dL 105  100  110   BUN 6 - 20 mg/dL 38  29  21   Creatinine 0.61 - 1.24 mg/dL 9.92  7.80  5.68   Sodium 135 - 145 mmol/L 129  128  130   Potassium 3.5 - 5.1 mmol/L 4.7  4.5  4.2   Chloride 98 - 111 mmol/L 93  92  95   CO2 22 - 32 mmol/L 22  22  25    Calcium 8.9 - 10.3 mg/dL 9.3  9.2  8.9      Patient Lines/Drains/Airways Status     Active Line/Drains/Airways  Name Placement date Placement time Site Days   Peripheral IV 08/05/22 20 G Right Hand 08/05/22  1818  Hand  4   Hemodialysis Catheter Right Internal jugular Double lumen Permanent (Tunneled) 08/20/21  1324  Internal jugular  354   Wound / Incision (Open or Dehisced) 08/05/22 Ankle Anterior;Right;Lateral open wound wrapped with abd and kerlix. weeping 08/05/22  --  Ankle  4   Wound / Incision (Open or Dehisced) 08/05/22 Ankle Anterior;Left;Lateral open wounds wrapped with abd and kerlix, weeping 08/05/22  --  Ankle  4   Wound / Incision (Open or Dehisced) 08/06/22 Skin tear Thigh Posterior;Proximal;Right 08/06/22  --  Thigh  3             ASSESSMENT/PLAN:  Assessment: Principal Problem:   Acute on chronic hypoxic respiratory failure ([name]) Active Problems:   OSA (obstructive sleep apnea)   Hidradenitis suppurativa   Paroxysmal atrial flutter ([name])   ESRD (end stage renal disease) on dialysis ([name])   Acute on chronic respiratory failure with hypoxia ([name])   Acute exacerbation of CHF (congestive heart failure) ([name])   Acute on chronic diastolic heart failure ([place])   Plan: Acute on chronic hypoxic respiratory failure, improving Acute on chronic HFrEF with EF 20 to 25% He has undergone serial HD sessions to help with volume overload. AHF  team signed off as he is stable from a cardiac perspective. He is recommended to undergo daily HD sessions with UF while hospitalized. Weight is -8.5kg from admission and he is -4.5L fluid since admission. He is not a candidate for advanced therapies per cardiology unfortunately. Will likely need dialysis 4 days a week as outpatient for volume control. Now saturating >90% on 4L. We discussed the importance of continuing dialysis and he expresses understanding. He did not feel well yesterday and states this is why he refused HD. We discussed that he talk with [place] if he feels like this in the future so we can counsel. We discussed importance of renal diet in the treatment of his severe volume overload and he understands. -Continue serial HD sessions with UF  -Supplemental O2, goal sat >92% -Daily weights -Strict I and Os -Encouraged patient to get OOB with mobility tech and to keep bilateral LE elevated to encourage further reduction in LE edema.  -Cardiology consulted and now signed off, appreciate their assistance -Nephrology consulted, appreciate their assistance   Anemia of chronic disease Iron deficiency anemia Hemoglobin remains low but stable, 7.3 this morning. He does not want blood transfusion unless Hgb <6 or he has symptoms. I reiterated that the normal threshold for blood transfusion is hemoglobin <7. -aranesp, ferrlicit -Daily CBC   ESRD Plan is for daily HD while hospitalized. He receives HD Tuesday Thursday Saturday in the outpatient setting. He was dismissed from prior hospitalization in which he had acute hypoxic respiratory failure secondary to volume overload due to aggressive behavior and ongoing medical/HD nonadherence. Patient refused HD yesterday, stating that his "labs are good." He is amenable to dialysis today.  -nephrology consulted, appreciate assistance  -daily HD  Thrombocytopenia Platelets improved again today to 263k. He has a history of HIT and has not received  heparin products during this admission. -Daily CBC   OSA/ OHS -Nightly CPAP  Paroxysmal atrial fibrillation/flutter Currently rate controlled. Patient has had ablation completed. He remains high risk for returning to abnormal rhythm. He has been intermittently refusing his eliquis due to concerns that his anemia is due to the medication. Our team has counseled him  extensively regarding eliquis. Metoprolol has been recommended in the past but patient declines as he believes he is allergic. -Continue Eliquis 5 mg BID -Continue telemetry monitoring   Bilateral malleolus wounds secondary to chronic edema Unchanged, secondary to now-improving lower extremity edema. -Oxycodone on 5 mg q6h PRN severe pain -Patient educated on elevation of LE and ambulation to assist in further reduction of LE edema which is more likely than not the main driver of his pain   History of hidradenitis suppurativa -Continued home doxycycline 100 mg BID  Best Practice: Diet: Renal diet with 1200 mL fluid restriction IVF: None Code: Full DISPO: Anticipated discharge to Home pending  medical stability .  Signature: Linward Natal, MD Internal Medicine Resident, PGY-1 [facility]  Pager: [phone]   Please contact the on call pager after 5 pm and on weekends at [phone].

## 2022-08-12 NOTE — Progress Notes (Signed)
Refused Bipap 

## 2022-08-13 ENCOUNTER — Other Ambulatory Visit: Payer: Self-pay | Admitting: Student

## 2022-08-13 DIAGNOSIS — J9621 Acute and chronic respiratory failure with hypoxia: Secondary | ICD-10-CM | POA: Diagnosis not present

## 2022-08-13 DIAGNOSIS — Z87891 Personal history of nicotine dependence: Secondary | ICD-10-CM | POA: Diagnosis not present

## 2022-08-13 MED ORDER — CAMPHOR-MENTHOL 0.5-0.5 % EX LOTN: TOPICAL_LOTION | CUTANEOUS | 0 refills | Status: DC | PRN

## 2022-08-13 MED ORDER — CLINDAMYCIN PHOSPHATE 1 % EX GEL: Freq: Two times a day (BID) | CUTANEOUS | 0 refills | Status: DC

## 2022-08-13 MED ORDER — CALAMINE EX LOTN: 1.0000 | TOPICAL_LOTION | Freq: Two times a day (BID) | CUTANEOUS | 0 refills | Status: DC

## 2022-08-13 NOTE — Progress Notes (Signed)
Occupational Therapy Treatment Patient Details Name: Phillip Young MRN: [id number] DOB: 15-Jan-1986 Today's Date: 08/13/2022   History of present illness Pt is a 37 yo male admitted with volume overload and acute on chronic CHF. 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 This is pt's 4th hospitalization since Sept. Pt on 6 L O2 at home.   OT comments  Education provided for AE use for LB dressing. Patient instructed on use of reacher for doffing socks and patient states he is able to doff socks without reacher. Patient educated on sock aide use for LB dressing with mod assist to use. Patient demonstrated gains with mobility with patient able to static stand without a device and RW for dynamic standing and mobility. Patient to be discharged to home with no OT follow due to good support at home. Acute OT to continue to follow.    Recommendations for follow up therapy are one component of a multi-disciplinary discharge planning process, led by the attending physician.  Recommendations may be updated based on patient status, additional functional criteria and insurance authorization.    Follow Up Recommendations  No OT follow up     Assistance Recommended at Discharge Intermittent Supervision/Assistance  Patient can return home with the following  A little help with walking and/or transfers;A little help with bathing/dressing/bathroom;Assistance with cooking/housework;Assist for transportation;Help with stairs or ramp for entrance   Equipment Recommendations  None recommended by OT    Recommendations for Other Services      Precautions / Restrictions Precautions Precautions: Fall Restrictions Weight Bearing Restrictions: No       Mobility Bed Mobility Overal bed mobility: Modified Independent, Needs Assistance             General bed mobility comments: seated on EOB at beginning and end of session     Transfers Overall transfer level: Needs assistance Equipment used: Rolling walker (2 wheels) Transfers: Sit to/from Stand Sit to Stand: Supervision           General transfer comment: performed mobility x2 in room with RW to address transfers     Balance Overall balance assessment: Needs assistance Sitting-balance support: Feet supported Sitting balance-Leahy Scale: Good     Standing balance support: Bilateral upper extremity supported, No upper extremity supported, During functional activity Standing balance-Leahy Scale: Fair Standing balance comment: able to static stand without support, RW for dynamic standing                           ADL either performed or assessed with clinical judgement   ADL Overall ADL's : Needs assistance/impaired             Lower Body Bathing: Sit to/from stand;Cueing for compensatory techniques;Moderate assistance Lower Body Bathing Details (indicate cue type and reason): to bathe feet     Lower Body Dressing: Moderate assistance;Sit to/from stand;Cueing for compensatory techniques Lower Body Dressing Details (indicate cue type and reason): able to doff socks without AE but required sock aide and mod assist to donn               General ADL Comments: education on AE for LB dressing    Extremity/Trunk Assessment              Vision       Perception     Praxis      Cognition Arousal/Alertness: Awake/alert Behavior During Therapy: [facility] for tasks assessed/performed  Overall Cognitive Status: Within Functional Limits for tasks assessed                                 General Comments: frequent cues for safety        Exercises      Shoulder Instructions       General Comments      Pertinent Vitals/ Pain       Pain Assessment Pain Assessment: Faces Faces Pain Scale: Hurts a little bit Pain Location: B feet Pain Descriptors / Indicators: Discomfort, Burning Pain Intervention(s):  Limited activity within patient's tolerance, Monitored during session, Repositioned  Home Living                                          Prior Functioning/Environment              Frequency  Min 2X/week        Progress Toward Goals  OT Goals(current goals can now be found in the care plan section)  Progress towards OT goals: Progressing toward goals  Acute Rehab OT Goals Patient Stated Goal: go home OT Goal Formulation: With patient Time For Goal Achievement: 08/20/22 Potential to Achieve Goals: Good ADL Goals Pt Will Perform Lower Body Bathing: with supervision;with adaptive equipment Pt Will Perform Lower Body Dressing: with supervision;with adaptive equipment;sit to/from stand Additional ADL Goal #1: Pt will walk to bathroom with walker and complete all toileting tasks on raised bariatric 3:1 over commode with supervision. Additional ADL Goal #2: Pt wll state three things he can change in his adl routine at home to conserve energy without cues. Additional ADL Goal #3: Pt will state reason of importance of following fluid restrictions and taking meds as perscribed to attempt to reduce readmissions due to noncompliance.  Plan Discharge plan remains appropriate    Co-evaluation                 AM-PAC OT "6 Clicks" Daily Activity     Outcome Measure   Help from another person eating meals?: None Help from another person taking care of personal grooming?: None Help from another person toileting, which includes using toliet, bedpan, or urinal?: A Little Help from another person bathing (including washing, rinsing, drying)?: A Little Help from another person to put on and taking off regular upper body clothing?: None Help from another person to put on and taking off regular lower body clothing?: A Lot 6 Click Score: 20    End of Session Equipment Utilized During Treatment: Rolling walker (2 wheels);Oxygen  OT Visit Diagnosis: Unsteadiness on  feet (R26.81)   Activity Tolerance Patient tolerated treatment well   Patient Left in bed;with call bell/phone within reach (seated on EOB)   Nurse Communication Mobility status        Time: 5427-0623 OT Time Calculation (min): 32 min  Charges: OT Treatments $Self Care/Home Management : 23-37 mins  Lodema Hong, [place]  Office [phone]   Trixie Dredge 08/13/2022, 11:43 AM

## 2022-08-13 NOTE — Progress Notes (Signed)
Clindamycin topical sent in for HS

## 2022-08-13 NOTE — Progress Notes (Signed)
Refused bipap stated "not tonight"

## 2022-08-13 NOTE — Discharge Summary (Signed)
Name: Phillip Young MRN: [id number] DOB: 09-04-1985 37 y.o. PCP: [name], MD  Date of Admission: 08/05/2022  5:10 PM Date of Discharge: 08/13/2022 Attending Physician: Campbell Riches, MD  Discharge Diagnosis: 1. Principal Problem:   Acute on chronic hypoxic respiratory failure ([name]) Active Problems:   OSA (obstructive sleep apnea)   Hidradenitis suppurativa   Paroxysmal atrial flutter ([name])   ESRD (end stage renal disease) on dialysis ([name])   Acute on chronic respiratory failure with hypoxia ([name])   Acute exacerbation of CHF (congestive heart failure) ([name])   Acute on chronic diastolic heart failure [facility])  Discharge Medications: Allergies as of 08/13/2022       Reactions   Amiodarone Other (See Comments)   Suspicion for amiodarone lung/hepatotoxicity Other Reaction(s): Suspicion amiodarone toxicity   Coreg [carvedilol] Shortness Of Breath, Diarrhea   Wheezing    Heparin Other (See Comments)   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, SRA positive   Metoprolol Other (See Comments)   near syncope        Medication List     STOP taking these medications    doxycycline 100 MG tablet Commonly known as: VIBRA-TABS   ferrous sulfate 325 (65 FE) MG tablet       TAKE these medications    apixaban 5 MG Tabs tablet Commonly known as: Eliquis Take 1 tablet (5 mg total) by mouth 2 (two) times daily.   calamine lotion Apply 1 Application topically 2 (two) times daily.   camphor-menthol lotion Commonly known as: SARNA Apply topically as needed for itching.   fluticasone furoate-vilanterol 100-25 MCG/ACT Aepb Commonly known as: BREO ELLIPTA Inhale 1 puff into the lungs daily.   lanthanum 1000 MG chewable tablet Commonly known as: FOSRENOL Chew 2,000 mg by mouth 3 (three) times daily with meals.   melatonin 5 MG Tabs Take 1 tablet (5 mg total) by mouth at bedtime.   midodrine 5 MG tablet Commonly known as: PROAMATINE TAKE 1 TABLET(5 MG) BY MOUTH  THREE TIMES DAILY WITH MEALS   torsemide 100 MG tablet Commonly known as: DEMADEX TAKE 1 TABLET BY MOUTH 3 TIMES A WEEK ON NON-DIALYSIS DAYS.               Durable Medical Equipment  (From admission, onward)           Start     Ordered   08/06/22 1816  For home use only DME 4 wheeled rolling walker with seat  Once       Question Answer Comment  Patient needs a walker to treat with the following condition Heart failure [facility])   Patient needs a walker to treat with the following condition ESRD (end stage renal disease) ([place])      08/06/22 1815              Discharge Care Instructions  (From admission, onward)           Start     Ordered   08/13/22 0000  Discharge wound care:       Comments: Apply Xeroform gauze to bilateral LE wounds Q day, then cover with ABD pads and kerlex, then ace wrap    Foam dressing to right buttock. Change Q 3 days or PRN soiling   08/13/22 1132            Disposition and follow-up:   Mr.Little Blasius was discharged from [facility] in Stable condition.  At the hospital follow up visit please address:  1.  Acute on chronic hypoxic respiratory failure, improving Acute on chronic HFrEF with EF 20 to 25% Fluid overload managed with serial HD. Patients lower extremity edema and SOB with moderate improvement during admission. Not a candidate for advanced therapies per cardiology. Weight is -8.8kg since admission and he is -3.6L fluid since admission. At baseline oxygenation with 3L O2. Will continue torsemide at discharge.  Anemia of chronic disease Iron deficiency anemia Hemoglobin remained around 7. He did not want blood transfusion unless Hgb <6 or he had symptoms. We reiterated that the normal threshold for blood transfusion is hemoglobin <7. Continued with weekly aranesp, ferrlicit per nephrology. Stopped PO iron. Please f/u with CBC.  ESRD Patient to start back with outpatient dialysis 1/20 and will be on  Tues,Wed, Thurs, Sat. Schedule.   Thrombocytopenia Platelets stably within normal limits by discharge. He has a history of HIT but did not received heparin products during this admission.   OSA/ OHS Refused CPAP intermittently during admission. Please follow up to encourage continued use at home.   Paroxysmal atrial fibrillation/flutter NSR this admission, with some sinus tachycardia but largely normal rate. Patient has had ablation completed in the past. He remains high risk for returning to abnormal rhythm. Intermittently refused his eliquis due to concerns that his anemia is due to the medication. Our team counseled him extensively regarding eliquis. Metoprolol has been recommended in the past but patient declines as he believes he is allergic. Will continue Eliquis 5 mg BID at discharge.   Bilateral malleolus wounds secondary to chronic edema Dressing instructions in discharge instructions. Ambulatory referral to wound clinic. Please follow up with exam of bilateral malleoli without wounds likely 2/2 chronic edema vs venous insufficiency.   History of hidradenitis suppurativa Right buttock with partial thickness lesion related to HS. Finished course of doxycycline. Please follow up with skin exam.  2.  Labs / imaging needed at time of follow-up: CBC, BMP  3.  Pending labs/ test needing follow-up: none  Follow-up Appointments:  [facility]. Go on 08/14/2022.   Why: Schedule is Tuesday/Thusday/Saturday with 6:50 am chair time. Resume on Saturday at normal time.  Extra treatment each week will be on Wednesdays. Please arrive at 7:40 am for 8:00 am chair time. Contact information: [place] [phone]                 Hospital Course by problem list: 1. Acute on chronic heart failure exacerbation Heart failure reduced ejection fraction Non-ischemic cardiomyopathy Patient has history of heart failure  secondary to non-ischemic cardiomyopathy that is limited in treatment options by comorbid ESRD and amiodarone toxicity. Exacerbations have led to two hospitalizations since discharge from [facility] on 11-16 when he was prescribed a six-week taper of prednisone. After missing some hemodialysis sessions during the holidays, he was admitted to [facility] on 11-27. Throughout that hospitalization, diuresis removed a total of 24L and patient returned home but his symptoms returned and he was admitted again on 12-12. At that time he developed thrombocytopenia and was given at least four days of intravenous dexamethasone, which worsened his lower extremity swelling. On 1-10, the patient reports that he was discharged from [facility] despite persistence of his breath shortness and leg swelling. Upon arrival to the [facility] ED, physical exam demonstrated marked lower extremity swelling and 4+ pitting edema while chest radiograph revealed central pulmonary congestion. Most recent echocardiogram performed 12-13 demonstrated ejection fraction of 25-30%, slightly improved from prior  study in 02-2022. Admission weight was 193.4 kg, up from 186.2kg at prior [facility] discharge on 11-16. Patient reported taking torsemide daily at home. Presentation was consistent with acute on chronic heart failure exacerbation initially precipitated by overload secondary to missed hemodialysis sessions and now persisting from corticosteroid-induced fluid retention. It remains unclear why patient remains decompensated despite near-continuous hospitalizations in recent months, only identifiable change is the introduction of corticosteroids. Given complexity of this case, including low EF and BP plus tachycardia, AHF was consulted. GDMT limited by CKD and hypotension requiring midodrine. He is not a good candidate for ICD placement given obesity and dialysis. Volume control achieved with serial dialysis, beginning 1/13. Weight on day of discharge -8.8kg since  admission and he is -3.6L fluid since admission. Have been able to wean to baseline oxygenation with 3L O2. Will continue torsemide at discharge. Set up for dialysis tomorrow morning and will continue with HD 4 times weekly.  End-stage renal disease Patient has history of end-stage renal disease and received hemodialysis on a Tu-Th-Sa schedule before this admission. He was hospitalized in 05-2022 after missing some sessions during the holidays, which led to heart failure exacerbation and further fluid retention. Most recent session was earlier on day of presentation 1/11 when 5L fluid was removed. Upon arrival, laboratory testing consistent with baseline electrolytes and improved BUN level. Given these findings, primary team had low concern for urgent hemodialysis but consulted nephrology who recommended HD mon-sat with UF. He seems to be benefitting from serial dialysis. Now felt appropriate to transition to outpatient HD 4 times weekly and is set up to begin tomorrow (1/20).  Discharge Exam:   BP 117/66   Pulse (!) 103 Comment: MAP 79  Temp 98.4 F (36.9 C) (Tympanic)   Resp 18   Ht 6' (1.829 m)   Wt (!) 184.6 kg   SpO2 92%   BMI 55.19 kg/m  Discharge exam: Physical Exam Constitutional:      General: He is not in acute distress. HENT:     Head: Normocephalic and atraumatic.  Eyes:     Extraocular Movements: Extraocular movements intact.  Cardiovascular:     Rate and Rhythm: Regular rhythm. Tachycardia present.     Heart sounds: No murmur heard. Pulmonary:     Effort: Pulmonary effort is normal.  Musculoskeletal:     Right lower leg: 2+ Pitting Edema present.     Left lower leg: 2+ Pitting Edema present.  Skin:    General: Skin is warm and dry.  Neurological:     Mental Status: He is alert and oriented to person, place, and time.  Psychiatric:        Mood and Affect: Mood normal.        Behavior: Behavior normal.      Pertinent Labs, Studies, and Procedures:  DG Chest 2  View  Result Date: 08/05/2022 CLINICAL DATA:  Shortness of breath EXAM: CHEST - 2 VIEW COMPARISON:  Chest x-ray 07/08/2022.  Chest CT 06/30/2022. FINDINGS: Heart is enlarged, unchanged. There central pulmonary vascular congestion. Right-sided central venous catheter tip projects over the distal SVC. There is no lung consolidation, pleural effusion or pneumothorax. No acute fractures are seen. IMPRESSION: Cardiomegaly and central pulmonary vascular congestion. Electronically Signed   By: Ronney Asters M.D.   On: 08/05/2022 18:54     Discharge Instructions: Discharge Instructions     Ambulatory referral to Wound Clinic   Complete by: As directed    Bilateral malleolus wounds 2/2 chronic edema  Diet - low sodium heart healthy   Complete by: As directed    Discharge instructions   Complete by: As directed    Mr. Weisenberger,  It was a pleasure caring for you during your admission here at [facility]. You came in with a heart failure exacerbation and your symptoms have improved with dialysis. Please note the following items: -please schedule a follow up with our clinic in the next 1-2 weeks -please attend outpatient dialysis beginning tomorrow (Saturday). You will now be going 4 days a week to manage your fluid overload   Discharge wound care:   Complete by: As directed    Apply Xeroform gauze to bilateral LE wounds Q day, then cover with ABD pads and kerlex, then ace wrap    Foam dressing to right buttock. Change Q 3 days or PRN soiling   Increase activity slowly   Complete by: As directed        Signed: Linward Natal, MD 08/13/2022, 11:47 AM   Pager: [phone]

## 2022-08-13 NOTE — TOC Transition Note (Signed)
Transition of Care [facility]) - CM/SW Discharge Note   Patient Details  Name: Phillip Young MRN: [id number] Date of Birth: 07-04-1986  [facility]) CM/SW Contact:  Tom-Johnson, Renea Ee, RN Phone Number: 08/13/2022, 1:55 PM   Clinical Narrative:     Patient is scheduled for discharge today. Home health recommended, patient declined, stated he does not need it.  Bariatric rollator ordered from [employer], Erasmo Downer notified CM that patient has a deductable of $2000, has to pay $144.16. [name] wants to hold off until she can afford it. Patient is on home O2, has O2 tank in his car and will be driving self self home.  No further TOC needs noted.       Final next level of care: Home w Home Health Services Barriers to Discharge: Barriers Resolved   Patient Goals and CMS Choice CMS Medicare.gov Compare Post Acute Care list provided to:: Patient Choice offered to / list presented to : Patient  Discharge Placement                  Patient to be transferred to facility by: Self      Discharge Plan and Services Additional resources added to the After Visit Summary for     Discharge Planning Services: CM Consult            DME Arranged: Walker rolling with seat DME Agency: [employer] (Has a deductable of $2000, has to pay $144.16. Wants to hold off.) Date DME Agency Contacted: 08/13/22 Time DME Agency Contacted: [phone] Representative spoke with at DME Agency: [place]: PT, RN, Disease Management [place] Agency: [place] Date [place]: 08/13/22 Time [place]: [place] Representative spoke with at [place]: [place] Determinants of Health ([name]) Interventions [place]: No Food Insecurity (05/23/2022)  Housing: Low Risk  (05/23/2022)  Transportation Needs: No Transportation Needs (05/23/2022)  Utilities: Not At Risk (05/23/2022)  Depression (PHQ2-9): Low Risk  (04/09/2022)  Financial  Resource Strain: Low Risk  (04/13/2021)  Tobacco Use: Medium Risk (08/05/2022)     Readmission Risk Interventions    06/09/2022   12:19 PM 05/12/2021   10:35 AM 10/16/2020    2:10 PM  Readmission Risk Prevention Plan  Transportation Screening Complete Complete Complete  PCP or Specialist Appt within 5-7 Days   Complete  Home Care Screening   Complete  Medication Review (RN CM)   Referral to Pharmacy  Medication Review (RN Care Manager) Complete Complete   PCP or Specialist appointment within 3-5 days of discharge Complete Complete   HRI or Home Care Consult Complete Complete   SW Recovery Care/Counseling Consult Complete Complete   Palliative Care Screening Not Applicable Complete   [place] Not Applicable Complete

## 2022-08-13 NOTE — Progress Notes (Signed)
[facility] Progress Note   Assessment/ Plan:   Dialysis Orders: TTS [facility] 4h 10min  425/ 1.5  180kg  3K/2.5Ca bath  RIJ TDC  NO HEPARIN - uses citrate for catheter lock solution - last HD at [facility] was 1/11, poast wt was 192 kg, 4 kg removed - prior HD at [facility] was 12/09 and prior to that, 11/25 - CE shows that pt was admitted at [facility] from 07/06/22 to 08/04/22 - venofer 50 weekly - hectorol 6 mcg tiw IV - sensipar 30 mg po tiw - mircera 225 mcg IV q2, last on 1/11, due on 1/25   Assessment/Plan: A/C hypoxic resp failure - on higher O2 here than usually uses at home, but no edema on CXR, +vasc congestion. Obvious vol overload in terms of LE edema. Needs daily dialysis for max fluid removal HFrEF - EF 25%, per pmd, also followed by the CHF team ESRD - on HD TTS. Pt has been IP at [facility] 07/06/22-08/04/22 for acute hypoxic respiratory failure. Per discharge summary, patient was dismissed from previous hospitalization due to aggressive behavior and ongoing medical and hemodialysis non-compliance. Spoke with Dr. [name] 1/12 who recommends daily HD while hospitalized.  Pt declined daily dialysis while hospitalized but did agree to 4x weekly dialysis.  Rx time will need to be 4 hr 15 min HTN/ volume - 12kg over dry weight as of today. Obvious anasarca as above Anemia esrd -.  Continue to follow trend closely and will continue to discuss with patient if a transfusion is indicated. Patient with history of HIT so no heparin products. Using 4% sodium citrate for catheter lock solution (per outpatient HD records). Iron deficient- getting ferrlicet x 4 now  Aranesp 200 mcg 08/09/22- modified for weekly. MBD ckd - Cca and PO4 in range. Says he is only taking 1000mg  of fosrenol with meals as phos binder. Will continue Fosrenol, IV vdra, and senispar.  Afib/A-flutter - followed by Cardiology: hx DCCVs, s/p ablation 12/23 at [facility], amiodarone stopped d/t possible amiodarone pulmonary toxicity, on  Eliquis. Possible amiodarone toxicity - seen by pulmonary 11/23 (started on Prednisone during that time), amiodarone stopped Chron resp failure - due to MO/ OSA/ OHS Dispo: d/c today.    Subjective:    Had HD yesterday.  3.5L removed.  For d/c today- discussed 4x weekly HD and pt reports he is willing to do so.     Objective:   BP 117/66   Pulse (!) 103 Comment: MAP 79  Temp 98.4 F (36.9 C) (Tympanic)   Resp 18   Ht 6' (1.829 m)   Wt (!) 184.6 kg   SpO2 92%   BMI 55.19 kg/m   Physical Exam: Gen: NAD, sitting in bed CVS: RRR Resp: clear anteriorly Abd: soft Ext: 3+ anasarca ACCESS: R IJ [facility]  Labs: BMET Recent Labs  Lab 08/06/22 1450 08/07/22 0033 08/08/22 0107 08/09/22 0042 08/10/22 0107 08/11/22 0102 08/12/22 0101  NA  --  130* 130* 129* 130* 128* 129*  K  --  4.4 4.1 4.3 4.2 4.5 4.7  CL  --  95* 94* 94* 95* 92* 93*  CO2  --  23 24 22 25 22 22   GLUCOSE  --  106* 99 111* 110* 100* 105*  BUN  --  27* 21* 33* 21* 29* 38*  CREATININE  --  5.44* 4.95* 7.08* 5.68* 7.80* 9.92*  CALCIUM  --  8.9 8.9 8.9 8.9 9.2 9.3  PHOS 4.3 3.9 3.8 4.9*  --   --   --  CBC Recent Labs  Lab 08/07/22 0033 08/08/22 0107 08/09/22 0042 08/10/22 0107 08/11/22 0102 08/12/22 0101  WBC 7.4   < > 6.2 6.4 7.2 6.2  NEUTROABS 5.2  --  4.2  --   --   --   HGB 6.6*   < > 6.7* 6.8* 7.1* 7.3*  HCT 21.2*   < > 20.9* 21.2* 22.6* 23.4*  MCV 107.6*   < > 106.1* 105.0* 106.1* 105.4*  PLT 113*   < > 170 201 226 263   < > = values in this interval not displayed.      Medications:     apixaban  5 mg Oral BID   calamine  1 Application Topical BID   Chlorhexidine Gluconate Cloth  6 each Topical Q0600   cinacalcet  30 mg Oral Q T,Th,Sa-HD   [START ON 08/16/2022] darbepoetin (ARANESP) injection - DIALYSIS  200 mcg Subcutaneous Q Mon-1800   doxercalciferol  6 mcg Intravenous Q T,Th,Sa-HD   fluticasone furoate-vilanterol  1 puff Inhalation Daily   lanthanum  1,000 mg Oral TID WC   melatonin   5 mg Oral QHS     Madelon Lips, MD 08/13/2022, 11:52 AM

## 2022-08-13 NOTE — Plan of Care (Addendum)
PT pleasant and cooperative. Had question about paperwork he needed for work. Internal Medicine office closed at time of discharge. PT updated that this RN confirmed with provider, Dr. Linward Natal, that paperwork was complete. Per provider, it was submitted to the clinical team and he verbalized it may have been faxed by the office. PT made aware of same and stated he would call his Education officer, [employer]. PT discharging to door with staff.    Problem: Education: Goal: Knowledge of General Education information will improve Description: Including pain rating scale, medication(s)/side effects and non-pharmacologic comfort measures Outcome: Adequate for Discharge   Problem: Health Behavior/Discharge Planning: Goal: Ability to manage health-related needs will improve Outcome: Adequate for Discharge   Problem: Clinical Measurements: Goal: Ability to maintain clinical measurements within normal limits will improve Outcome: Adequate for Discharge Goal: Will remain free from infection Outcome: Adequate for Discharge Goal: Diagnostic test results will improve Outcome: Adequate for Discharge Goal: Respiratory complications will improve Outcome: Adequate for Discharge Goal: Cardiovascular complication will be avoided Outcome: Adequate for Discharge   Problem: Activity: Goal: Risk for activity intolerance will decrease Outcome: Adequate for Discharge   Problem: Nutrition: Goal: Adequate nutrition will be maintained Outcome: Adequate for Discharge   Problem: Coping: Goal: Level of anxiety will decrease Outcome: Adequate for Discharge   Problem: Elimination: Goal: Will not experience complications related to bowel motility Outcome: Adequate for Discharge Goal: Will not experience complications related to urinary retention Outcome: Adequate for Discharge   Problem: Pain Managment: Goal: General experience of comfort will improve Outcome: Adequate for Discharge   Problem: Safety: Goal:  Ability to remain free from injury will improve Outcome: Adequate for Discharge   Problem: Skin Integrity: Goal: Risk for impaired skin integrity will decrease Outcome: Adequate for Discharge   Problem: Education: Goal: Ability to demonstrate management of disease process will improve Outcome: Adequate for Discharge Goal: Ability to verbalize understanding of medication therapies will improve Outcome: Adequate for Discharge Goal: Individualized Educational Video(s) Outcome: Adequate for Discharge   Problem: Activity: Goal: Capacity to carry out activities will improve Outcome: Adequate for Discharge   Problem: Cardiac: Goal: Ability to achieve and maintain adequate cardiopulmonary perfusion will improve Outcome: Adequate for Discharge

## 2022-08-13 NOTE — Progress Notes (Addendum)
Contacted by attending regarding plans for pt to d/c today and that pt will require 4x's a week HD at d/c per nephrologist. Contacted clinic and pt can receive an extra treatment on Wednesdays. Pt will need to arrive at 7:40 am for 8:00 chair time on Wednesday only. [name] TTS schedule will remain the same. Hospital staff advised that pt has declined 4x's a week treatment in the past and clinic confirmed this as well. Provider to discuss option with pt.   Melven Sartorius Renal Navigator [phone]  Addendum at 10:41 am: Nephrologist spoke to pt regarding extra treatment. Pt currently agreeable. Will add information to pt's AVS. Contacted clinic to make them aware that pt will d/c today and resume care tomorrow at normal time and that pt is currently agreeable to extra treatment on Wednesdays.

## 2022-08-13 NOTE — Plan of Care (Signed)
Ne goals set due to progression.

## 2022-08-13 NOTE — Progress Notes (Signed)
HD#6 SUBJECTIVE:  Patient Summary: Phillip Young is a 37 y.o. with a pertinent PMH of EF 20 to 25%, A-fib-a flutter, ESRD on HD, and OSA, who presented with lower extremity edema and shortness of breath and admitted on 1/11 for acute on chronic heart failure exacerbation.   Overnight Events: NAEO. Refused CPAP.  Interim History: Patient states his swelling feels better today. Was able to do dialysis yesterday and plans to do again today. States his breathing was "up and down" last night. Feels like CPAP "numbers weren't right" and he felt SOB when placed on it. Asked about plan and we discussed touching base with kidney doctors today to discuss disposition.   OBJECTIVE:  Vital Signs: Vitals:   08/12/22 1916 08/12/22 2044 08/13/22 0012 08/13/22 0456  BP: 115/68 (!) 92/57 (!) 95/54 117/66  Pulse: 95 (!) 102 (!) 104 (!) 103  Resp:  20  18  Temp: 98.3 F (36.8 C) 99.2 F (37.3 C)  98.4 F (36.9 C)  TempSrc: Oral Oral  Tympanic  SpO2: 99% 90%  92%  Weight:    (!) 184.6 kg  Height:       Supplemental O2: Nasal Cannula, >90% on 4L  Filed Weights   08/12/22 1416 08/12/22 1516 08/13/22 0456  Weight: (!) 187.9 kg (!) 187.9 kg (!) 184.6 kg     Intake/Output Summary (Last 24 hours) at 08/13/2022 9528 Last data filed at 08/13/2022 0203 Gross per 24 hour  Intake 976 ml  Output 53.5 ml  Net 922.5 ml   Net IO Since Admission: -3,630.5 mL [08/13/22 0812]  Physical Exam: Physical Exam Constitutional:      Appearance: He is obese.  Cardiovascular:     Rate and Rhythm: Normal rate and regular rhythm.     Heart sounds: No murmur heard. Pulmonary:     Effort: Pulmonary effort is normal.     Breath sounds: No wheezing or rales.  Musculoskeletal:     Right lower leg: Tenderness present. Edema (2+) present.     Left lower leg: Tenderness present. Edema (2+) present.  Skin:    General: Skin is warm and dry.  Neurological:     Mental Status: He is alert and oriented to person, place,  and time.  Psychiatric:        Mood and Affect: Mood normal.        Behavior: Behavior normal.       Latest Ref Rng & Units 08/12/2022    1:01 AM 08/11/2022    1:02 AM 08/10/2022    1:07 AM  CBC  WBC 4.0 - 10.5 K/uL 6.2  7.2  6.4   Hemoglobin 13.0 - 17.0 g/dL 7.3  7.1  6.8   Hematocrit 39.0 - 52.0 % 23.4  22.6  21.2   Platelets 150 - 400 K/uL 263  226  201       Latest Ref Rng & Units 08/12/2022    1:01 AM 08/11/2022    1:02 AM 08/10/2022    1:07 AM  BMP  Glucose 70 - 99 mg/dL 105  100  110   BUN 6 - 20 mg/dL 38  29  21   Creatinine 0.61 - 1.24 mg/dL 9.92  7.80  5.68   Sodium 135 - 145 mmol/L 129  128  130   Potassium 3.5 - 5.1 mmol/L 4.7  4.5  4.2   Chloride 98 - 111 mmol/L 93  92  95   CO2 22 - 32 mmol/L 22  22  25   Calcium 8.9 - 10.3 mg/dL 9.3  9.2  8.9      Patient Lines/Drains/Airways Status     Active Line/Drains/Airways     Name Placement date Placement time Site Days   Peripheral IV 08/05/22 20 G Right Hand 08/05/22  1818  Hand  4   Hemodialysis Catheter Right Internal jugular Double lumen Permanent (Tunneled) 08/20/21  1324  Internal jugular  354   Wound / Incision (Open or Dehisced) 08/05/22 Ankle Anterior;Right;Lateral open wound wrapped with abd and kerlix. weeping 08/05/22  --  Ankle  4   Wound / Incision (Open or Dehisced) 08/05/22 Ankle Anterior;Left;Lateral open wounds wrapped with abd and kerlix, weeping 08/05/22  --  Ankle  4   Wound / Incision (Open or Dehisced) 08/06/22 Skin tear Thigh Posterior;Proximal;Right 08/06/22  --  Thigh  3             ASSESSMENT/PLAN:  Assessment: Principal Problem:   Acute on chronic hypoxic respiratory failure ([name]) Active Problems:   OSA (obstructive sleep apnea)   Hidradenitis suppurativa   Paroxysmal atrial flutter ([name])   ESRD (end stage renal disease) on dialysis ([name])   Acute on chronic respiratory failure with hypoxia ([name])   Acute exacerbation of CHF (congestive heart failure) ([name])   Acute on chronic  diastolic heart failure ([place])   Plan: Acute on chronic hypoxic respiratory failure, improving Acute on chronic HFrEF with EF 20 to 25% He has undergone serial HD sessions to help with volume overload. AHF team signed off as he is stable from a cardiac perspective. He is recommended to undergo daily HD sessions with UF while hospitalized. Weight is -8.8kg from admission and he is -3.6L fluid since admission. He is not a candidate for advanced therapies per cardiology unfortunately. Will likely need dialysis 4 days a week as outpatient for volume control.  We discussed the importance of continuing dialysis and he expresses understanding. Patient underwent dialysis yesterday and tolerated well. Continues with mild improvements on exam with modest improvement in lower leg edema. Now saturating 96% on 4L. -Continue serial HD sessions with UF  -Supplemental O2, goal sat >92% -Daily weights -Strict I and Os -Encouraged patient to get OOB with mobility tech and to keep bilateral LE elevated to encourage further reduction in LE edema.  -Cardiology consulted and now signed off, appreciate their assistance -Nephrology consulted, appreciate their assistance   Anemia of chronic disease Iron deficiency anemia Hemoglobin remains low but stable.Marland Kitchen He does not want blood transfusion unless Hgb <6 or he has symptoms. I reiterated that the normal threshold for blood transfusion is hemoglobin <7. -aranesp, ferrlicit per nephrology -Daily CBC   ESRD Plan is for daily HD while hospitalized. He receives HD Tuesday Thursday Saturday in the outpatient setting. He was dismissed from prior hospitalization in which he had acute hypoxic respiratory failure secondary to volume overload due to aggressive behavior and ongoing medical/HD nonadherence. He is amenable to dialysis again today. Will reach out to nephrology to discuss transitioning to outpatient dialysis. -nephrology consulted, appreciate assistance  -daily  HD  Thrombocytopenia Platelets stable. He has a history of HIT and has not received heparin products during this admission. -Daily CBC   OSA/ OHS -Nightly CPAP  Paroxysmal atrial fibrillation/flutter Currently rate controlled. Patient has had ablation completed. He remains high risk for returning to abnormal rhythm. He has been intermittently refusing his eliquis due to concerns that his anemia is due to the medication. Our team has counseled him extensively regarding  eliquis. Metoprolol has been recommended in the past but patient declines as he believes he is allergic. -Continue Eliquis 5 mg BID -Continue telemetry monitoring   Bilateral malleolus wounds secondary to chronic edema Unchanged, secondary to now-improving lower extremity edema. -Oxycodone on 5 mg q6h PRN severe pain -Patient educated on elevation of LE and ambulation to assist in further reduction of LE edema which is more likely than not the main driver of his pain   History of hidradenitis suppurativa -Continued home doxycycline 100 mg BID  Best Practice: Diet: Renal diet with 1200 mL fluid restriction IVF: None Code: Full DISPO: Anticipated discharge to Home pending  medical stability .  Signature: Linward Natal, MD Internal Medicine Resident, PGY-1 [facility]  Pager: [phone]   Please contact the on call pager after 5 pm and on weekends at [phone].

## 2022-08-14 ENCOUNTER — Telehealth: Payer: Self-pay | Admitting: Nephrology

## 2022-08-14 DIAGNOSIS — Z992 Dependence on renal dialysis: Secondary | ICD-10-CM | POA: Diagnosis not present

## 2022-08-14 DIAGNOSIS — N186 End stage renal disease: Secondary | ICD-10-CM | POA: Diagnosis not present

## 2022-08-14 DIAGNOSIS — T8249XA Other complication of vascular dialysis catheter, initial encounter: Secondary | ICD-10-CM | POA: Diagnosis not present

## 2022-08-14 DIAGNOSIS — N2581 Secondary hyperparathyroidism of renal origin: Secondary | ICD-10-CM | POA: Diagnosis not present

## 2022-08-14 NOTE — Telephone Encounter (Signed)
Transition of Care Contact from [place]   Date of Discharge: 08/13/22 Date of Contact: 08/14/22 Method of contact: phone Talked to patient   Patient contacted to discuss transition of care form recent hospitaliztion. Patient was admitted to [facility] from 08/05/22 to 08/13/22 with the discharge diagnosis of acute on chronic hypoxic respiratory failure, Acute on chronic diastolic HF, hidradenitis suppurativa.     Medication changes were reviewed.  Patient will follow up with is outpatient dialysis center 08/15/21.   Other follow up needs include: reports BP drop with dizziness at HD today.  Discussed bringing midodrine with him to treatment and taking additional dose half way through treatment as needed.    Jen Mow, PA-C [facility] Pager: [phone]

## 2022-08-15 DIAGNOSIS — I509 Heart failure, unspecified: Secondary | ICD-10-CM | POA: Diagnosis not present

## 2022-08-15 DIAGNOSIS — J9610 Chronic respiratory failure, unspecified whether with hypoxia or hypercapnia: Secondary | ICD-10-CM | POA: Diagnosis not present

## 2022-08-15 DIAGNOSIS — I5023 Acute on chronic systolic (congestive) heart failure: Secondary | ICD-10-CM | POA: Diagnosis not present

## 2022-08-16 ENCOUNTER — Telehealth: Payer: Self-pay

## 2022-08-16 NOTE — Patient Outreach (Signed)
  Care Coordination TOC Note Transition Care Management Unsuccessful Follow-up Telephone Call  Date of discharge and from where:  [facility] 08/13/22  Attempts:  1st Attempt  Reason for unsuccessful TCM follow-up call:  Unable to leave message- Voicemail not identified.  Johnney Killian, RN, BSN, CCM Care Management Coordinator [facility] Phone: [phone]: [phone]

## 2022-08-17 ENCOUNTER — Telehealth: Payer: Self-pay

## 2022-08-17 DIAGNOSIS — N186 End stage renal disease: Secondary | ICD-10-CM | POA: Diagnosis not present

## 2022-08-17 DIAGNOSIS — N2581 Secondary hyperparathyroidism of renal origin: Secondary | ICD-10-CM | POA: Diagnosis not present

## 2022-08-17 DIAGNOSIS — Z992 Dependence on renal dialysis: Secondary | ICD-10-CM | POA: Diagnosis not present

## 2022-08-17 DIAGNOSIS — E8770 Fluid overload, unspecified: Secondary | ICD-10-CM | POA: Diagnosis not present

## 2022-08-17 DIAGNOSIS — T8249XA Other complication of vascular dialysis catheter, initial encounter: Secondary | ICD-10-CM | POA: Diagnosis not present

## 2022-08-17 NOTE — Patient Outreach (Signed)
  Care Coordination TOC Note Transition Care Management Follow-up Telephone Call Date of discharge and from where: [facility] 08/13/22 How have you been since you were released from the hospital? Weak, a little bit better. Any questions or concerns? Yes- I can't afford the copay for my walker or some medications.  When asked about Medicare or Medicaid patient stated he want to go back to work, he has been out since June.  Items Reviewed: Did the pt receive and understand the discharge instructions provided? Yes  Medications obtained and verified?  Patient notes he can't afford copays Other? No  Any new allergies since your discharge? No  Dietary orders reviewed? Yes Do you have support at home? Yes   Home Care and Equipment/Supplies: Were home health services ordered? no If so, what is the name of the agency? N/a  Has the agency set up a time to come to the patient's home? no Were any new equipment or medical supplies ordered?  No What is the name of the medical supply agency? N/a Were you able to get the supplies/equipment? not applicable Do you have any questions related to the use of the equipment or supplies? No  Functional Questionnaire: (I = Independent and D = Dependent) ADLs: Needs some assistance  Bathing/Dressing- I  Meal Prep- I  Eating- I  Maintaining continence- I  Transferring/Ambulation- supervision  Managing Meds- I  Follow up appointments reviewed:  PCP Hospital f/u appt confirmed? Yes  Scheduled to see Dr. Coy Saunas on 08/18/22 @ 3:45. [facility] f/u appt confirmed? No   Are transportation arrangements needed? No  If their condition worsens, is the pt aware to call PCP or go to the Emergency Dept.? Yes Was the patient provided with contact information for the PCP's office or ED? Yes Was to pt encouraged to call back with questions or concerns? Yes  SDOH assessments and interventions completed:   Yes SDOH Interventions Today    Flowsheet Row  Most Recent Value  SDOH Interventions   Food Insecurity Interventions Intervention Not Indicated  Housing Interventions Intervention Not Indicated       Care Coordination Interventions:  Referred for Care Coordination Services:  RN Care Coordinator   Encounter Outcome:  Pt. Visit Completed

## 2022-08-18 ENCOUNTER — Ambulatory Visit (INDEPENDENT_AMBULATORY_CARE_PROVIDER_SITE_OTHER): Payer: BC Managed Care – PPO | Admitting: Student

## 2022-08-18 VITALS — BP 101/77 | HR 83 | Temp 99.8°F | Ht 71.0 in | Wt >= 6400 oz

## 2022-08-18 DIAGNOSIS — I5043 Acute on chronic combined systolic (congestive) and diastolic (congestive) heart failure: Secondary | ICD-10-CM

## 2022-08-18 DIAGNOSIS — I4892 Unspecified atrial flutter: Secondary | ICD-10-CM | POA: Diagnosis not present

## 2022-08-18 DIAGNOSIS — J9621 Acute and chronic respiratory failure with hypoxia: Secondary | ICD-10-CM | POA: Diagnosis not present

## 2022-08-18 DIAGNOSIS — I509 Heart failure, unspecified: Secondary | ICD-10-CM | POA: Diagnosis not present

## 2022-08-18 DIAGNOSIS — I5023 Acute on chronic systolic (congestive) heart failure: Secondary | ICD-10-CM | POA: Diagnosis not present

## 2022-08-18 DIAGNOSIS — J9611 Chronic respiratory failure with hypoxia: Secondary | ICD-10-CM

## 2022-08-18 DIAGNOSIS — Z87891 Personal history of nicotine dependence: Secondary | ICD-10-CM

## 2022-08-18 DIAGNOSIS — N186 End stage renal disease: Secondary | ICD-10-CM | POA: Diagnosis not present

## 2022-08-18 DIAGNOSIS — I5022 Chronic systolic (congestive) heart failure: Secondary | ICD-10-CM

## 2022-08-18 DIAGNOSIS — Z992 Dependence on renal dialysis: Secondary | ICD-10-CM

## 2022-08-18 MED ORDER — APIXABAN 5 MG PO TABS: 5.0000 mg | ORAL_TABLET | Freq: Two times a day (BID) | ORAL | 5 refills | Status: DC

## 2022-08-18 MED ORDER — TORSEMIDE 100 MG PO TABS: ORAL_TABLET | ORAL | 2 refills | Status: DC

## 2022-08-18 NOTE — Progress Notes (Signed)
CC: Hospital follow-up  HPI:  [name] is a 37 y.o. morbidly obese middle-age man presents to clinic today for follow-up after recent hospitalization for heart failure exacerbation. Please see problem based charting for evaluation, assessment and plan.  Past Medical History:  Diagnosis Date   Acute on chronic respiratory failure with hypoxia ([place]) 04/21/2021   Acute on chronic systolic (congestive) heart failure ([name]) 02/26/2020   Biventricular congestive heart failure ([place])    Last Echo 11/2019 at [facility] reveals EF 20%   Class 3 severe obesity due to excess calories with serious comorbidity and body mass index (BMI) of 50.0 to 59.9 in adult [facility]) 02/26/2020   Essential hypertension 02/26/2020   GERD without esophagitis 02/26/2020   Hidradenitis suppurativa 02/26/2020   OSA (obstructive sleep apnea) 02/26/2020   Prediabetes 02/26/2020   Renal disorder     Review of Systems:  Constitutional: Positive for fatigue Eyes: Negative for visual changes Respiratory: Positive for shortness of breath and dyspnea on exertion Cardiac: Negative for chest pain MSK: Positive for lower extremity swelling Neuro: Negative for headache or weakness  Physical Exam: General: Morbidly obese gentleman in a wheelchair. NAD. Cardiac: RRR. No murmurs, rubs or gallops. 1-2+ BLE pitting edema Respiratory: 3 L [place]. Mildly increased WOB. No accessory muscle use.  Decreased breath sounds with bibasilar rales but no wheezing. Abdominal: Abdominal obesity. Soft, symmetric and non tender. Normal BS. Skin: Warm, dry. Healing sores on both lateral malleolus with. Extremities: Venous stasis changes of both lower extremities. Dry, unkept feet with thick toenails. Neuro: A&O x 3. Moves all extremities. Normal sensation to gross touch. Psych: Mildly Frustrated mood.  Vitals:   08/18/22 1619  BP: 101/77  Pulse: 83  Temp: 99.8 F (37.7 C)  TempSrc: Oral  SpO2: 93%  Weight: (!) 411 lb 6.4 oz (186.6  kg)  Height: 5\' 11"  (1.803 m)    Assessment & Plan:   Acute on chronic hypoxic respiratory failure [facility]) Patient here for follow-up after being hospitalized from 1/11-1/19. Patient is an ESRD patient with EF 20-25% admitted for acute on chronic hypoxic respiratory failure secondary to heart failure exacerbation in the setting of increased fluid retention from steroids therapy. Fluid overload managed with serial HD and IV Lasix. Patients lower extremity edema and SOB with moderate improvement during admission.  Patient deemed not a candidate for advanced therapy by cardiology due to ESRD, morbid obesity and hypotension. Patient was diuresed about 9 kg and discharged home on at baseline oxygenation of 3L O2. Patient reports that he has amiodarone toxicity and steroids often helps his breathing however they also cause him to retain fluids. Since hospital discharge, patient has been going to dialysis but continues to feel shortness of breath. States he has not had any of his medications since he left the hospital. He thinks someone at home must have threw them away. He has been going to dialysis 3 times a week but stopped yesterday session 1 hour early due to not feeling well.  Patient's weight is up 5 pounds since discharge. He has slightly increased work of breathing on exam with O2 sats in the mid 90s on 3 L [place].  Discussed with patient about getting palliative care involved in his care however patient states while he understands his current situation, he is not ready to engage palliative care yet. Discussed with him the role of palliative care in the outpatient to patient with agreement to discuss it further at next office visit.  Counseled patient  on importance of medication adherence and taking his torsemide to help with his respiratory symptoms.  Plan: -Refilled torsemide 100 mg 3 times a week on non-dialysis day -Continue O2 supplementation -Follow-up with cardiology as needed -Re-visit palliative  care conversation at next visit -Referral to community care coordination to assist with medication  Paroxysmal atrial flutter ([place]) Patient previously on amiodarone for this but developed amiodarone toxicity so his amiodarone was discontinued. He has maintained normal sinus and currently in sinus rhythm with heart rate in the 80s during evaluation. Patient has had a history of nonadherence to recommended metoprolol and Eliquis. He was discharged on Eliquis 5 mg twice daily however states that he could not find his medications at home and has not taken any Eliquis since he left the hospital. -Refill Eliquis 5 mg twice daily  ESRD (end stage renal disease) on dialysis [facility]) Patient received serial dialysis during recent hospitalization. Nephrology recommended dialysis 4 times a day in the outpatient due to his increased risk of fluid accumulation. Patient reports that he has only been doing dialysis 3 times a day since he left the hospital.  Reports cutting dialysis short by 1 hour yesterday due to not feeling well. Recent labs from HD shows mild hyponatremia but normal potassium.   -Continue Tues, thurs, Sat HD -Continue torsemide 100 mg on nondialysis days -Follow-up with nephrology as needed  Acute exacerbation of CHF (congestive heart failure) ([place]) Patient here for follow-up after being hospitalized from 1/11-1/19. Patient is an ESRD patient with EF 20-25% admitted for acute on chronic hypoxic respiratory failure secondary to heart failure exacerbation in the setting of increased fluid retention from steroids therapy. Fluid overload managed with serial HD and IV Lasix. Patients lower extremity edema and SOB with moderate improvement during admission.  Patient deemed not a candidate for advanced therapy by cardiology due to ESRD, morbid obesity and hypotension. Patient was diuresed about 9 kg and discharged home on at baseline oxygenation of 3L O2. Patient reports that he has amiodarone toxicity and  steroids often helps his breathing however they also cause him to retain fluids. Since hospital discharge, patient has been going to dialysis but continues to feel shortness of breath. States he has not had any of his medications since he left the hospital. He thinks someone at home must have threw them away. He has been going to dialysis 3 times a week but stopped yesterday session 1 hour early due to not feeling well.  Patient's weight is up 5 pounds since discharge. He has slightly increased work of breathing on exam with O2 sats in the mid 90s on 3 L [place].  Discussed with patient about getting palliative care involved in his care however patient states while he understands his current situation, he is not ready to engage palliative care yet. Discussed with him the role of palliative care in the outpatient to patient with agreement to discuss it further at next office visit.  Counseled patient on importance of medication adherence and taking his torsemide to help with his respiratory symptoms.  Plan: -Refilled torsemide 100 mg 3 times a week on non-dialysis day -Continue O2 supplementation -Follow-up with cardiology as needed -Re-visit palliative care conversation at next visit -Referral to community care coordination to assist with medication    See Encounters Tab for problem based charting.  Patient discussed with Dr.  Thomes Cake, MD, MPH

## 2022-08-18 NOTE — Patient Instructions (Signed)
Thank you, [name] for allowing [place] to provide your care today. Today we discussed your most recent hospitalization.  I have sent refills of your torsemide and Eliquis to your pharmacy.  Please make sure to take your medications as prescribed to help with your symptoms follow-up with [place] in 1 month.   My Chart Access: [email or web address]  Please follow-up in 1 month  Please make sure to arrive 15 minutes prior to your next appointment. If you arrive late, you may be asked to reschedule.    We look forward to seeing you next time. Please call our clinic at [phone] if you have any questions or concerns. The best time to call is Monday-Friday from 9am-4pm, but there is someone available 24/7. If after hours or the weekend, call the main hospital number and ask for the Internal Medicine Resident On-Call. If you need medication refills, please notify your pharmacy one week in advance and they will send [place] a request.   Thank you for letting us take part in your care. Wishing you the best!  Lacinda Axon, MD 08/18/2022, 4:46 PM IM Resident, PGY-3 [facility] 41:10

## 2022-08-19 DIAGNOSIS — Z992 Dependence on renal dialysis: Secondary | ICD-10-CM | POA: Diagnosis not present

## 2022-08-19 DIAGNOSIS — N2581 Secondary hyperparathyroidism of renal origin: Secondary | ICD-10-CM | POA: Diagnosis not present

## 2022-08-19 DIAGNOSIS — N186 End stage renal disease: Secondary | ICD-10-CM | POA: Diagnosis not present

## 2022-08-19 DIAGNOSIS — E8770 Fluid overload, unspecified: Secondary | ICD-10-CM | POA: Diagnosis not present

## 2022-08-19 DIAGNOSIS — T8249XA Other complication of vascular dialysis catheter, initial encounter: Secondary | ICD-10-CM | POA: Diagnosis not present

## 2022-08-20 ENCOUNTER — Encounter: Payer: Self-pay | Admitting: Student

## 2022-08-20 NOTE — Assessment & Plan Note (Addendum)
Patient here for follow-up after being hospitalized from 1/11-1/19. Patient is an ESRD patient with EF 20-25% admitted for acute on chronic hypoxic respiratory failure secondary to heart failure exacerbation in the setting of increased fluid retention from steroids therapy. Fluid overload managed with serial HD and IV Lasix. Patients lower extremity edema and SOB with moderate improvement during admission.  Patient deemed not a candidate for advanced therapy by cardiology due to ESRD, morbid obesity and hypotension. Patient was diuresed about 9 kg and discharged home on at baseline oxygenation of 3L O2. Patient reports that he has amiodarone toxicity and steroids often helps his breathing however they also cause him to retain fluids. Since hospital discharge, patient has been going to dialysis but continues to feel shortness of breath. States he has not had any of his medications since he left the hospital. He thinks someone at home must have threw them away. He has been going to dialysis 3 times a week but stopped yesterday session 1 hour early due to not feeling well.  Patient's weight is up 5 pounds since discharge. He has slightly increased work of breathing on exam with O2 sats in the mid 90s on 3 L [place].  Discussed with patient about getting palliative care involved in his care however patient states while he understands his current situation, he is not ready to engage palliative care yet. Discussed with him the role of palliative care in the outpatient to patient with agreement to discuss it further at next office visit.  Counseled patient on importance of medication adherence and taking his torsemide to help with his respiratory symptoms.  Plan: -Refilled torsemide 100 mg 3 times a week on non-dialysis day -Continue O2 supplementation -Follow-up with cardiology as needed -Re-visit palliative care conversation at next visit -Referral to community care coordination to assist with medication

## 2022-08-20 NOTE — Assessment & Plan Note (Signed)
Patient here for follow-up after being hospitalized from 1/11-1/19. Patient is an ESRD patient with EF 20-25% admitted for acute on chronic hypoxic respiratory failure secondary to heart failure exacerbation in the setting of increased fluid retention from steroids therapy. Fluid overload managed with serial HD and IV Lasix. Patients lower extremity edema and SOB with moderate improvement during admission.  Patient deemed not a candidate for advanced therapy by cardiology due to ESRD, morbid obesity and hypotension. Patient was diuresed about 9 kg and discharged home on at baseline oxygenation of 3L O2. Patient reports that he has amiodarone toxicity and steroids often helps his breathing however they also cause him to retain fluids. Since hospital discharge, patient has been going to dialysis but continues to feel shortness of breath. States he has not had any of his medications since he left the hospital. He thinks someone at home must have threw them away. He has been going to dialysis 3 times a week but stopped yesterday session 1 hour early due to not feeling well.  Patient's weight is up 5 pounds since discharge. He has slightly increased work of breathing on exam with O2 sats in the mid 90s on 3 L [place].  Discussed with patient about getting palliative care involved in his care however patient states while he understands his current situation, he is not ready to engage palliative care yet. Discussed with him the role of palliative care in the outpatient to patient with agreement to discuss it further at next office visit.  Counseled patient on importance of medication adherence and taking his torsemide to help with his respiratory symptoms.  Plan: -Refilled torsemide 100 mg 3 times a week on non-dialysis day -Continue O2 supplementation -Follow-up with cardiology as needed -Re-visit palliative care conversation at next visit -Referral to community care coordination to assist with medication

## 2022-08-20 NOTE — Assessment & Plan Note (Addendum)
Patient previously on amiodarone for this but developed amiodarone toxicity so his amiodarone was discontinued. He has maintained normal sinus and currently in sinus rhythm with heart rate in the 80s during evaluation. Patient has had a history of nonadherence to recommended metoprolol and Eliquis. He was discharged on Eliquis 5 mg twice daily however states that he could not find his medications at home and has not taken any Eliquis since he left the hospital. -Refill Eliquis 5 mg twice daily

## 2022-08-20 NOTE — Assessment & Plan Note (Signed)
Patient received serial dialysis during recent hospitalization. Nephrology recommended dialysis 4 times a day in the outpatient due to his increased risk of fluid accumulation. Patient reports that he has only been doing dialysis 3 times a day since he left the hospital.  Reports cutting dialysis short by 1 hour yesterday due to not feeling well. Recent labs from HD shows mild hyponatremia but normal potassium.   -Continue Tues, thurs, Sat HD -Continue torsemide 100 mg on nondialysis days -Follow-up with nephrology as needed

## 2022-08-21 DIAGNOSIS — T8249XA Other complication of vascular dialysis catheter, initial encounter: Secondary | ICD-10-CM | POA: Diagnosis not present

## 2022-08-21 DIAGNOSIS — N186 End stage renal disease: Secondary | ICD-10-CM | POA: Diagnosis not present

## 2022-08-21 DIAGNOSIS — E8770 Fluid overload, unspecified: Secondary | ICD-10-CM | POA: Diagnosis not present

## 2022-08-21 DIAGNOSIS — Z992 Dependence on renal dialysis: Secondary | ICD-10-CM | POA: Diagnosis not present

## 2022-08-21 DIAGNOSIS — N2581 Secondary hyperparathyroidism of renal origin: Secondary | ICD-10-CM | POA: Diagnosis not present

## 2022-08-23 ENCOUNTER — Telehealth: Payer: Self-pay

## 2022-08-23 ENCOUNTER — Telehealth: Payer: Self-pay | Admitting: *Deleted

## 2022-08-23 NOTE — Telephone Encounter (Signed)
   Telephone encounter was:  Unsuccessful.  08/23/2022 Name: Phillip Young MRN: [id number] DOB: [birth year]  Unsuccessful outbound call made today to assist with:  Transportation Needs   Outreach Attempt:  1st Attempt  A HIPAA compliant voice message was left requesting a return call.  Instructed patient to call back at [phone].  [place] [phone] [address] , [place] [address] Email : [email or web address]

## 2022-08-23 NOTE — Telephone Encounter (Signed)
Left message requesting call back regarding medication cost issues.  Call back [phone]

## 2022-08-23 NOTE — Progress Notes (Signed)
Internal Medicine Clinic Attending  Case discussed with Dr. Amponsah  At the time of the visit.  We reviewed the resident's history and exam and pertinent patient test results.  I agree with the assessment, diagnosis, and plan of care documented in the resident's note.  

## 2022-08-24 DIAGNOSIS — Z992 Dependence on renal dialysis: Secondary | ICD-10-CM | POA: Diagnosis not present

## 2022-08-24 DIAGNOSIS — T8249XA Other complication of vascular dialysis catheter, initial encounter: Secondary | ICD-10-CM | POA: Diagnosis not present

## 2022-08-24 DIAGNOSIS — E8770 Fluid overload, unspecified: Secondary | ICD-10-CM | POA: Diagnosis not present

## 2022-08-24 DIAGNOSIS — N2581 Secondary hyperparathyroidism of renal origin: Secondary | ICD-10-CM | POA: Diagnosis not present

## 2022-08-24 DIAGNOSIS — N186 End stage renal disease: Secondary | ICD-10-CM | POA: Diagnosis not present

## 2022-08-25 ENCOUNTER — Telehealth: Payer: Self-pay | Admitting: *Deleted

## 2022-08-25 DIAGNOSIS — E8770 Fluid overload, unspecified: Secondary | ICD-10-CM | POA: Diagnosis not present

## 2022-08-25 DIAGNOSIS — Z992 Dependence on renal dialysis: Secondary | ICD-10-CM | POA: Diagnosis not present

## 2022-08-25 DIAGNOSIS — T8249XA Other complication of vascular dialysis catheter, initial encounter: Secondary | ICD-10-CM | POA: Diagnosis not present

## 2022-08-25 DIAGNOSIS — N2581 Secondary hyperparathyroidism of renal origin: Secondary | ICD-10-CM | POA: Diagnosis not present

## 2022-08-25 DIAGNOSIS — N186 End stage renal disease: Secondary | ICD-10-CM | POA: Diagnosis not present

## 2022-08-25 DIAGNOSIS — E1122 Type 2 diabetes mellitus with diabetic chronic kidney disease: Secondary | ICD-10-CM | POA: Diagnosis not present

## 2022-08-25 NOTE — Telephone Encounter (Signed)
   Telephone encounter was:  Successful.  08/25/2022 Name: Phillip Young MRN: [id number] DOB: 03-08-1986  Phillip Young is a 37 y.o. year old male who is a primary care patient of Katsadouros, Candace Gallus, MD . The community resource team was consulted for assistance with Transportation Needs  and Food Insecurity Patient says he is driving now no need for transportation wanted food banks will send [place] list  Care guide performed the following interventions: Patient provided with information about care guide support team and interviewed to confirm resource needs.  Follow Up Plan:  No further follow up planned at this time. The patient has been provided with needed resources.  [place] [phone] [address] , [place] [address] Email : [email or web address]

## 2022-08-26 DIAGNOSIS — I5023 Acute on chronic systolic (congestive) heart failure: Secondary | ICD-10-CM | POA: Diagnosis not present

## 2022-08-27 ENCOUNTER — Telehealth: Payer: Self-pay

## 2022-08-27 NOTE — Telephone Encounter (Signed)
Call to pt reference PREP class starting on 09/06/22 MW 230p-345p.  Verified he can do that class.  Intake scheduled for 2/5 at 230p.  Will meet pt at front desk of [facility]

## 2022-08-28 DIAGNOSIS — N2581 Secondary hyperparathyroidism of renal origin: Secondary | ICD-10-CM | POA: Diagnosis not present

## 2022-08-28 DIAGNOSIS — N186 End stage renal disease: Secondary | ICD-10-CM | POA: Diagnosis not present

## 2022-08-28 DIAGNOSIS — T8249XA Other complication of vascular dialysis catheter, initial encounter: Secondary | ICD-10-CM | POA: Diagnosis not present

## 2022-08-28 DIAGNOSIS — Z992 Dependence on renal dialysis: Secondary | ICD-10-CM | POA: Diagnosis not present

## 2022-08-28 DIAGNOSIS — E8770 Fluid overload, unspecified: Secondary | ICD-10-CM | POA: Diagnosis not present

## 2022-08-30 ENCOUNTER — Telehealth: Payer: Self-pay | Admitting: *Deleted

## 2022-08-30 NOTE — Progress Notes (Signed)
  Care Coordination Note  08/30/2022 Name: Phillip Young MRN: [id number] DOB: Dec 03, 1985  Phillip Young is a 37 y.o. year old male who is a primary care patient of Katsadouros, Candace Gallus, MD and is actively engaged with the care management team. I reached out to [name] by phone today to assist with re-scheduling an initial visit with the RN Case Manager  Follow up plan: Unsuccessful telephone outreach attempt made. A HIPAA compliant phone message was left for the patient providing contact information and requesting a return call.   [name]  Direct Dial: [phone]

## 2022-08-31 DIAGNOSIS — Z992 Dependence on renal dialysis: Secondary | ICD-10-CM | POA: Diagnosis not present

## 2022-08-31 DIAGNOSIS — N186 End stage renal disease: Secondary | ICD-10-CM | POA: Diagnosis not present

## 2022-08-31 DIAGNOSIS — E8770 Fluid overload, unspecified: Secondary | ICD-10-CM | POA: Diagnosis not present

## 2022-08-31 DIAGNOSIS — T8249XA Other complication of vascular dialysis catheter, initial encounter: Secondary | ICD-10-CM | POA: Diagnosis not present

## 2022-08-31 DIAGNOSIS — N2581 Secondary hyperparathyroidism of renal origin: Secondary | ICD-10-CM | POA: Diagnosis not present

## 2022-09-01 DIAGNOSIS — T8249XA Other complication of vascular dialysis catheter, initial encounter: Secondary | ICD-10-CM | POA: Diagnosis not present

## 2022-09-01 DIAGNOSIS — Z992 Dependence on renal dialysis: Secondary | ICD-10-CM | POA: Diagnosis not present

## 2022-09-01 DIAGNOSIS — E8770 Fluid overload, unspecified: Secondary | ICD-10-CM | POA: Diagnosis not present

## 2022-09-01 DIAGNOSIS — N2581 Secondary hyperparathyroidism of renal origin: Secondary | ICD-10-CM | POA: Diagnosis not present

## 2022-09-01 DIAGNOSIS — N186 End stage renal disease: Secondary | ICD-10-CM | POA: Diagnosis not present

## 2022-09-03 NOTE — Progress Notes (Unsigned)
  Care Coordination Note  09/03/2022 Name: Omare Bilotta MRN: [id number] DOB: 1986-05-15  Joseth Weigel is a 37 y.o. year old male who is a primary care patient of Katsadouros, Candace Gallus, MD and is actively engaged with the care management team. I reached out to [name] by phone today to assist with re-scheduling an initial visit with the RN Case Manager  Follow up plan: Unsuccessful telephone outreach attempt made. A HIPAA compliant phone message was left for the patient providing contact information and requesting a return call.   [name]  Direct Dial: [phone]

## 2022-09-07 DIAGNOSIS — I5020 Unspecified systolic (congestive) heart failure: Secondary | ICD-10-CM | POA: Diagnosis not present

## 2022-09-07 DIAGNOSIS — I5023 Acute on chronic systolic (congestive) heart failure: Secondary | ICD-10-CM | POA: Diagnosis not present

## 2022-09-07 DIAGNOSIS — E662 Morbid (severe) obesity with alveolar hypoventilation: Secondary | ICD-10-CM | POA: Diagnosis not present

## 2022-09-07 DIAGNOSIS — E1122 Type 2 diabetes mellitus with diabetic chronic kidney disease: Secondary | ICD-10-CM | POA: Diagnosis not present

## 2022-09-07 DIAGNOSIS — Z992 Dependence on renal dialysis: Secondary | ICD-10-CM | POA: Diagnosis not present

## 2022-09-07 DIAGNOSIS — J9610 Chronic respiratory failure, unspecified whether with hypoxia or hypercapnia: Secondary | ICD-10-CM | POA: Diagnosis not present

## 2022-09-07 DIAGNOSIS — I132 Hypertensive heart and chronic kidney disease with heart failure and with stage 5 chronic kidney disease, or end stage renal disease: Secondary | ICD-10-CM | POA: Diagnosis not present

## 2022-09-07 DIAGNOSIS — Z87891 Personal history of nicotine dependence: Secondary | ICD-10-CM | POA: Diagnosis not present

## 2022-09-07 DIAGNOSIS — N186 End stage renal disease: Secondary | ICD-10-CM | POA: Diagnosis not present

## 2022-09-07 DIAGNOSIS — T8249XA Other complication of vascular dialysis catheter, initial encounter: Secondary | ICD-10-CM | POA: Diagnosis not present

## 2022-09-07 DIAGNOSIS — E8770 Fluid overload, unspecified: Secondary | ICD-10-CM | POA: Diagnosis not present

## 2022-09-07 DIAGNOSIS — I509 Heart failure, unspecified: Secondary | ICD-10-CM | POA: Diagnosis not present

## 2022-09-07 DIAGNOSIS — N2581 Secondary hyperparathyroidism of renal origin: Secondary | ICD-10-CM | POA: Diagnosis not present

## 2022-09-07 NOTE — Progress Notes (Signed)
  Care Coordination Note  09/07/2022 Name: Phillip Young MRN: [id number] DOB: 08/01/1985  Phillip Young is a 37 y.o. year old male who is a primary care patient of Katsadouros, Candace Gallus, MD and is actively engaged with the care management team. I reached out to [name] by phone today to assist with re-scheduling an initial visit with the RN Case Manager  Follow up plan: Telephone appointment with care management team member scheduled for:09/17/22  [place]  Direct Dial: [phone]

## 2022-09-08 DIAGNOSIS — Z992 Dependence on renal dialysis: Secondary | ICD-10-CM | POA: Diagnosis not present

## 2022-09-08 DIAGNOSIS — I132 Hypertensive heart and chronic kidney disease with heart failure and with stage 5 chronic kidney disease, or end stage renal disease: Secondary | ICD-10-CM | POA: Diagnosis not present

## 2022-09-08 DIAGNOSIS — T8249XA Other complication of vascular dialysis catheter, initial encounter: Secondary | ICD-10-CM | POA: Diagnosis not present

## 2022-09-08 DIAGNOSIS — N186 End stage renal disease: Secondary | ICD-10-CM | POA: Diagnosis not present

## 2022-09-08 DIAGNOSIS — E1122 Type 2 diabetes mellitus with diabetic chronic kidney disease: Secondary | ICD-10-CM | POA: Diagnosis not present

## 2022-09-08 DIAGNOSIS — I5020 Unspecified systolic (congestive) heart failure: Secondary | ICD-10-CM | POA: Diagnosis not present

## 2022-09-08 DIAGNOSIS — E8770 Fluid overload, unspecified: Secondary | ICD-10-CM | POA: Diagnosis not present

## 2022-09-08 DIAGNOSIS — N2581 Secondary hyperparathyroidism of renal origin: Secondary | ICD-10-CM | POA: Diagnosis not present

## 2022-09-08 DIAGNOSIS — Z87891 Personal history of nicotine dependence: Secondary | ICD-10-CM | POA: Diagnosis not present

## 2022-09-09 DIAGNOSIS — E8770 Fluid overload, unspecified: Secondary | ICD-10-CM | POA: Diagnosis not present

## 2022-09-09 DIAGNOSIS — E1122 Type 2 diabetes mellitus with diabetic chronic kidney disease: Secondary | ICD-10-CM | POA: Diagnosis not present

## 2022-09-09 DIAGNOSIS — Z992 Dependence on renal dialysis: Secondary | ICD-10-CM | POA: Diagnosis not present

## 2022-09-09 DIAGNOSIS — N2581 Secondary hyperparathyroidism of renal origin: Secondary | ICD-10-CM | POA: Diagnosis not present

## 2022-09-09 DIAGNOSIS — I5020 Unspecified systolic (congestive) heart failure: Secondary | ICD-10-CM | POA: Diagnosis not present

## 2022-09-09 DIAGNOSIS — N186 End stage renal disease: Secondary | ICD-10-CM | POA: Diagnosis not present

## 2022-09-09 DIAGNOSIS — Z87891 Personal history of nicotine dependence: Secondary | ICD-10-CM | POA: Diagnosis not present

## 2022-09-09 DIAGNOSIS — T8249XA Other complication of vascular dialysis catheter, initial encounter: Secondary | ICD-10-CM | POA: Diagnosis not present

## 2022-09-09 DIAGNOSIS — I132 Hypertensive heart and chronic kidney disease with heart failure and with stage 5 chronic kidney disease, or end stage renal disease: Secondary | ICD-10-CM | POA: Diagnosis not present

## 2022-09-11 DIAGNOSIS — N186 End stage renal disease: Secondary | ICD-10-CM | POA: Diagnosis not present

## 2022-09-11 DIAGNOSIS — I5020 Unspecified systolic (congestive) heart failure: Secondary | ICD-10-CM | POA: Diagnosis not present

## 2022-09-11 DIAGNOSIS — E1122 Type 2 diabetes mellitus with diabetic chronic kidney disease: Secondary | ICD-10-CM | POA: Diagnosis not present

## 2022-09-11 DIAGNOSIS — T8249XA Other complication of vascular dialysis catheter, initial encounter: Secondary | ICD-10-CM | POA: Diagnosis not present

## 2022-09-11 DIAGNOSIS — I132 Hypertensive heart and chronic kidney disease with heart failure and with stage 5 chronic kidney disease, or end stage renal disease: Secondary | ICD-10-CM | POA: Diagnosis not present

## 2022-09-11 DIAGNOSIS — E8770 Fluid overload, unspecified: Secondary | ICD-10-CM | POA: Diagnosis not present

## 2022-09-11 DIAGNOSIS — Z87891 Personal history of nicotine dependence: Secondary | ICD-10-CM | POA: Diagnosis not present

## 2022-09-11 DIAGNOSIS — N2581 Secondary hyperparathyroidism of renal origin: Secondary | ICD-10-CM | POA: Diagnosis not present

## 2022-09-11 DIAGNOSIS — Z992 Dependence on renal dialysis: Secondary | ICD-10-CM | POA: Diagnosis not present

## 2022-09-15 DIAGNOSIS — N2581 Secondary hyperparathyroidism of renal origin: Secondary | ICD-10-CM | POA: Diagnosis not present

## 2022-09-15 DIAGNOSIS — I509 Heart failure, unspecified: Secondary | ICD-10-CM | POA: Diagnosis not present

## 2022-09-15 DIAGNOSIS — N186 End stage renal disease: Secondary | ICD-10-CM | POA: Diagnosis not present

## 2022-09-15 DIAGNOSIS — I132 Hypertensive heart and chronic kidney disease with heart failure and with stage 5 chronic kidney disease, or end stage renal disease: Secondary | ICD-10-CM | POA: Diagnosis not present

## 2022-09-15 DIAGNOSIS — E8770 Fluid overload, unspecified: Secondary | ICD-10-CM | POA: Diagnosis not present

## 2022-09-15 DIAGNOSIS — E1122 Type 2 diabetes mellitus with diabetic chronic kidney disease: Secondary | ICD-10-CM | POA: Diagnosis not present

## 2022-09-15 DIAGNOSIS — I5023 Acute on chronic systolic (congestive) heart failure: Secondary | ICD-10-CM | POA: Diagnosis not present

## 2022-09-15 DIAGNOSIS — T8249XA Other complication of vascular dialysis catheter, initial encounter: Secondary | ICD-10-CM | POA: Diagnosis not present

## 2022-09-15 DIAGNOSIS — Z992 Dependence on renal dialysis: Secondary | ICD-10-CM | POA: Diagnosis not present

## 2022-09-15 DIAGNOSIS — I5020 Unspecified systolic (congestive) heart failure: Secondary | ICD-10-CM | POA: Diagnosis not present

## 2022-09-15 DIAGNOSIS — J9610 Chronic respiratory failure, unspecified whether with hypoxia or hypercapnia: Secondary | ICD-10-CM | POA: Diagnosis not present

## 2022-09-15 DIAGNOSIS — Z87891 Personal history of nicotine dependence: Secondary | ICD-10-CM | POA: Diagnosis not present

## 2022-09-16 DIAGNOSIS — Z87891 Personal history of nicotine dependence: Secondary | ICD-10-CM | POA: Diagnosis not present

## 2022-09-16 DIAGNOSIS — Z992 Dependence on renal dialysis: Secondary | ICD-10-CM | POA: Diagnosis not present

## 2022-09-16 DIAGNOSIS — I132 Hypertensive heart and chronic kidney disease with heart failure and with stage 5 chronic kidney disease, or end stage renal disease: Secondary | ICD-10-CM | POA: Diagnosis not present

## 2022-09-16 DIAGNOSIS — N2581 Secondary hyperparathyroidism of renal origin: Secondary | ICD-10-CM | POA: Diagnosis not present

## 2022-09-16 DIAGNOSIS — E8770 Fluid overload, unspecified: Secondary | ICD-10-CM | POA: Diagnosis not present

## 2022-09-16 DIAGNOSIS — I5020 Unspecified systolic (congestive) heart failure: Secondary | ICD-10-CM | POA: Diagnosis not present

## 2022-09-16 DIAGNOSIS — T8249XA Other complication of vascular dialysis catheter, initial encounter: Secondary | ICD-10-CM | POA: Diagnosis not present

## 2022-09-16 DIAGNOSIS — E1122 Type 2 diabetes mellitus with diabetic chronic kidney disease: Secondary | ICD-10-CM | POA: Diagnosis not present

## 2022-09-16 DIAGNOSIS — N186 End stage renal disease: Secondary | ICD-10-CM | POA: Diagnosis not present

## 2022-09-17 ENCOUNTER — Ambulatory Visit: Payer: Self-pay

## 2022-09-17 NOTE — Patient Outreach (Signed)
  Care Coordination   Initial Visit Note   09/17/2022 Name: Phillip Young MRN: [id number] DOB: July 26, 1986  Phillip Young is a 37 y.o. year old male who sees Phillip Pope, MD for primary care. I spoke with  Phillip Young by phone today.  What matters to the patients health and wellness today?  Phillip Young lives with his family and has a few medical conditions, including CHF, OSA, GERD, Hidradenitis Suppurativa, and ESRD. He undergoes dialysis treatment on Tuesday, Wednesday, Thursday, and Saturday. He mentioned that his mother and aunts would support him if needed. Phillip Young primary concern is the pain in his neck, back, legs, and feet, accompanied by swelling in his legs and feet. During our discussion, we talked about his diet, monitoring his salt and fluid intake, and recommended him to seek advice from the nutritionist at the dialysis center. We also discussed his medication and suggested he try using ointments or cold/heat therapy to alleviate someof his pain. We will further discuss his condition at our next appointment.    Goals Addressed             This Visit's Progress    I want help to deal with my daily pain and fatigue.       Patient Goals/Self Care Activities: -Patient/Caregiver will self-administer medications as prescribed as evidenced by self-report/primary caregiver report  -Patient/Caregiver will attend all scheduled provider appointments as evidenced by clinician review of documented attendance to scheduled appointments and patient/caregiver report -Patient/Caregiver will call pharmacy for medication refills as evidenced by patient report and review of pharmacy fill history as appropriate -Patient/Caregiver will call provider office for new concerns or questions as evidenced by review of documented incoming telephone call notes and patient report -Patient/Caregiver verbalizes understanding of plan -Patient/Caregiver will focus on medication adherence by taking  medications as prescribed  -Keep your airway clear from mucus build up  -Self assess breathing and make appointment with provider if you have beenany breathing issues -Utilize infection prevention strategies to reduce risk of respiratory infection   patient will demonstrate use of different relaxation  skills and/or diversional activities to assist with pain reduction (distraction, imagery, relaxation, massage, acupressure, TENS, heat, and cold application. and patient will verbalize acceptable level of pain relief and ability to engage in desired activities          SDOH assessments and interventions completed:  Yes  SDOH Interventions Today    Flowsheet Row Most Recent Value  SDOH Interventions   Food Insecurity Interventions Intervention Not Indicated  Transportation Interventions Intervention Not Indicated        Care Coordination Interventions:  Yes, provided   Interventions Today    Flowsheet Row Most Recent Value  Chronic Disease   Chronic disease during today's visit Chronic Kidney Disease/End Stage Renal Disease (ESRD), Other  [Pain and Fatigue]  General Interventions   General Interventions Discussed/Reviewed General Interventions Discussed, General Interventions Reviewed  Exercise Interventions   Exercise Discussed/Reviewed Assistive device use and maintanence  Education Interventions   Education Provided Provided Education  Provided Verbal Education On Other  [Heat and cold rubs ointments to help with pain]        Follow up plan: Follow up call scheduled for 10/01/22  230 pm    Encounter Outcome:  Pt. Visit Completed   Phillip Arms RN, BSN, [facility]   Phone: [phone]

## 2022-09-18 DIAGNOSIS — N186 End stage renal disease: Secondary | ICD-10-CM | POA: Diagnosis not present

## 2022-09-18 DIAGNOSIS — Z87891 Personal history of nicotine dependence: Secondary | ICD-10-CM | POA: Diagnosis not present

## 2022-09-18 DIAGNOSIS — I5020 Unspecified systolic (congestive) heart failure: Secondary | ICD-10-CM | POA: Diagnosis not present

## 2022-09-18 DIAGNOSIS — E8770 Fluid overload, unspecified: Secondary | ICD-10-CM | POA: Diagnosis not present

## 2022-09-18 DIAGNOSIS — N2581 Secondary hyperparathyroidism of renal origin: Secondary | ICD-10-CM | POA: Diagnosis not present

## 2022-09-18 DIAGNOSIS — E1122 Type 2 diabetes mellitus with diabetic chronic kidney disease: Secondary | ICD-10-CM | POA: Diagnosis not present

## 2022-09-18 DIAGNOSIS — I132 Hypertensive heart and chronic kidney disease with heart failure and with stage 5 chronic kidney disease, or end stage renal disease: Secondary | ICD-10-CM | POA: Diagnosis not present

## 2022-09-18 DIAGNOSIS — I5082 Biventricular heart failure: Secondary | ICD-10-CM | POA: Diagnosis not present

## 2022-09-18 DIAGNOSIS — Z992 Dependence on renal dialysis: Secondary | ICD-10-CM | POA: Diagnosis not present

## 2022-09-18 DIAGNOSIS — I5023 Acute on chronic systolic (congestive) heart failure: Secondary | ICD-10-CM | POA: Diagnosis not present

## 2022-09-18 DIAGNOSIS — T8249XA Other complication of vascular dialysis catheter, initial encounter: Secondary | ICD-10-CM | POA: Diagnosis not present

## 2022-09-18 NOTE — Patient Instructions (Signed)
Visit Information  Thank you for taking time to visit with me today. Please don't hesitate to contact me if I can be of assistance to you.   Following are the goals we discussed today:   Goals Addressed             This Visit's Progress    I want help to deal with my daily pain and fatigue.       Patient Goals/Self Care Activities: -Patient/Caregiver will self-administer medications as prescribed as evidenced by self-report/primary caregiver report  -Patient/Caregiver will attend all scheduled provider appointments as evidenced by clinician review of documented attendance to scheduled appointments and patient/caregiver report -Patient/Caregiver will call pharmacy for medication refills as evidenced by patient report and review of pharmacy fill history as appropriate -Patient/Caregiver will call provider office for new concerns or questions as evidenced by review of documented incoming telephone call notes and patient report -Patient/Caregiver verbalizes understanding of plan -Patient/Caregiver will focus on medication adherence by taking medications as prescribed  -Keep your airway clear from mucus build up  -Self assess breathing and make appointment with provider if you have beenany breathing issues -Utilize infection prevention strategies to reduce risk of respiratory infection   patient will demonstrate use of different relaxation  skills and/or diversional activities to assist with pain reduction (distraction, imagery, relaxation, massage, acupressure, TENS, heat, and cold application. and patient will verbalize acceptable level of pain relief and ability to engage in desired activities          Our next appointment is by telephone on 10/01/22 at 230 pm  Please call the care guide team at [phone] if you need to cancel or reschedule your appointment.   If you are experiencing a Mental Health or [place] or need someone to talk to, please call [phone]-TALK  (toll free, 24 hour hotline)  Patient verbalizes understanding of instructions and care plan provided today and agrees to view in [place]. Active MyChart status and patient understanding of how to access instructions and care plan via MyChart confirmed with patient.     Lazaro Arms RN, BSN, [facility]   Phone: [phone]

## 2022-09-21 ENCOUNTER — Ambulatory Visit (INDEPENDENT_AMBULATORY_CARE_PROVIDER_SITE_OTHER): Payer: BC Managed Care – PPO | Admitting: Student

## 2022-09-21 ENCOUNTER — Encounter: Payer: Self-pay | Admitting: Student

## 2022-09-21 ENCOUNTER — Other Ambulatory Visit: Payer: Self-pay

## 2022-09-21 VITALS — BP 115/65 | HR 100 | Temp 98.7°F | Ht 72.0 in | Wt >= 6400 oz

## 2022-09-21 DIAGNOSIS — Z992 Dependence on renal dialysis: Secondary | ICD-10-CM | POA: Diagnosis not present

## 2022-09-21 DIAGNOSIS — I509 Heart failure, unspecified: Secondary | ICD-10-CM

## 2022-09-21 DIAGNOSIS — Z87891 Personal history of nicotine dependence: Secondary | ICD-10-CM | POA: Diagnosis not present

## 2022-09-21 DIAGNOSIS — Z7189 Other specified counseling: Secondary | ICD-10-CM

## 2022-09-21 DIAGNOSIS — I5020 Unspecified systolic (congestive) heart failure: Secondary | ICD-10-CM | POA: Diagnosis not present

## 2022-09-21 DIAGNOSIS — I5022 Chronic systolic (congestive) heart failure: Secondary | ICD-10-CM

## 2022-09-21 DIAGNOSIS — N186 End stage renal disease: Secondary | ICD-10-CM

## 2022-09-21 DIAGNOSIS — E1122 Type 2 diabetes mellitus with diabetic chronic kidney disease: Secondary | ICD-10-CM | POA: Diagnosis not present

## 2022-09-21 DIAGNOSIS — J9611 Chronic respiratory failure with hypoxia: Secondary | ICD-10-CM

## 2022-09-21 DIAGNOSIS — T8249XA Other complication of vascular dialysis catheter, initial encounter: Secondary | ICD-10-CM | POA: Diagnosis not present

## 2022-09-21 DIAGNOSIS — I132 Hypertensive heart and chronic kidney disease with heart failure and with stage 5 chronic kidney disease, or end stage renal disease: Secondary | ICD-10-CM | POA: Diagnosis not present

## 2022-09-21 DIAGNOSIS — E8770 Fluid overload, unspecified: Secondary | ICD-10-CM | POA: Diagnosis not present

## 2022-09-21 DIAGNOSIS — N2581 Secondary hyperparathyroidism of renal origin: Secondary | ICD-10-CM | POA: Diagnosis not present

## 2022-09-21 LAB — RESP PANEL BY RT-PCR (RSV, FLU A&B, COVID)  RVPGX2
Influenza A by PCR: NEGATIVE
Influenza B by PCR: NEGATIVE
Resp Syncytial Virus by PCR: NEGATIVE
SARS Coronavirus 2 by RT PCR: NEGATIVE

## 2022-09-21 LAB — CBC WITH DIFFERENTIAL/PLATELET
Abs Immature Granulocytes: 0.07 10*3/uL (ref 0.00–0.07)
Basophils Absolute: 0.1 10*3/uL (ref 0.0–0.1)
Basophils Relative: 1 %
Eosinophils Absolute: 0.8 10*3/uL — ABNORMAL HIGH (ref 0.0–0.5)
Eosinophils Relative: 7 %
HCT: 30.4 % — ABNORMAL LOW (ref 39.0–52.0)
Hemoglobin: 9.5 g/dL — ABNORMAL LOW (ref 13.0–17.0)
Immature Granulocytes: 1 %
Lymphocytes Relative: 9 %
Lymphs Abs: 1 10*3/uL (ref 0.7–4.0)
MCH: 32.9 pg (ref 26.0–34.0)
MCHC: 31.3 g/dL (ref 30.0–36.0)
MCV: 105.2 fL — ABNORMAL HIGH (ref 80.0–100.0)
Monocytes Absolute: 0.6 10*3/uL (ref 0.1–1.0)
Monocytes Relative: 6 %
Neutro Abs: 8.2 10*3/uL — ABNORMAL HIGH (ref 1.7–7.7)
Neutrophils Relative %: 76 %
Platelets: 209 10*3/uL (ref 150–400)
RBC: 2.89 MIL/uL — ABNORMAL LOW (ref 4.22–5.81)
RDW: 16.4 % — ABNORMAL HIGH (ref 11.5–15.5)
WBC: 10.6 10*3/uL — ABNORMAL HIGH (ref 4.0–10.5)
nRBC: 0.2 % (ref 0.0–0.2)

## 2022-09-21 LAB — COMPREHENSIVE METABOLIC PANEL
ALT: 12 U/L (ref 0–44)
AST: 18 U/L (ref 15–41)
Albumin: 3.4 g/dL — ABNORMAL LOW (ref 3.5–5.0)
Alkaline Phosphatase: 104 U/L (ref 38–126)
Anion gap: 17 — ABNORMAL HIGH (ref 5–15)
BUN: 25 mg/dL — ABNORMAL HIGH (ref 6–20)
CO2: 24 mmol/L (ref 22–32)
Calcium: 10 mg/dL (ref 8.9–10.3)
Chloride: 98 mmol/L (ref 98–111)
Creatinine, Ser: 7.17 mg/dL — ABNORMAL HIGH (ref 0.61–1.24)
GFR, Estimated: 9 mL/min — ABNORMAL LOW (ref >60)
Glucose, Bld: 110 mg/dL — ABNORMAL HIGH (ref 70–99)
Potassium: 4 mmol/L (ref 3.5–5.1)
Sodium: 139 mmol/L (ref 135–145)
Total Bilirubin: 1 mg/dL (ref 0.3–1.2)
Total Protein: 7.4 g/dL (ref 6.5–8.1)

## 2022-09-21 NOTE — Assessment & Plan Note (Addendum)
Assessment: Phillip Young presented today after not feeling well since his last hospital admission from 08/13/2022. In the past he has had some improvement after hospitalizations and even was able to go back to work. Since 1/19 he has had progressively worsening of his chronic joint pain, body aches, fatigue, shortness of breath, weakness, and inability to do the things he usually does. Per nephrology notes he has stopped taking some of his medications and non-adherent to his diet or fluid restriction  On exam today he is chronically ill appearing and hemodynamically stable. He is on 6L [place] in no respiratory distress. He has distant heart sounds and decreased breath sounds at the bases bilaterally. Difficult to assess volume status but has 2+ lower extremity edema.   He mentions he was transitioned to DNR status after his nephrology appointment last week and they were beginning to discuss hospice. He states he is tired of feeling this way and is ready for hospice. His mother wanted him evaluated today for COVID, he was exposed to her 2 weeks ago when she was positive. His uncle is at bedside and encouraged him to push through and fight.  Phillip Young continued to say his family doesn't understand the suffering he has been going through over the last few weeks. He was hoping to have some improvement. I relayed to him and his uncle that I do not believe Phillip Young will have improvement, but continue to have a steady decline with his end stage heart failure, chronic hypoxic respiratory failure, ESRD, him not being a candidate for advanced therapies (transplant, LVAD, Impella), and continued non-adherence  to his fluid restriction/medications. If he would like to pursue hospice I offered him the options of hospital admission to help with his symptoms and transition to a hospice facility or go home with hospice. He initially mentioned wanting to be admitted and placed in a hospice facility.   After further  conversations with his family he would like to go home and see if he has any improvement in his symptoms over time. His family mentions him changing his diet and working on other lifestyle modifications why we assess for reversible causes.   I have reached out to Dr. Joelyn Oms of nephrology to discuss further. Will need to make sure DNR form signed and in place as these are Phillip Young' wishes now. He is agreeable to urgent outpatient palliative referral  Plan: - urgent palliative care referral - will need DNR form officially signed and MOST form completed

## 2022-09-21 NOTE — Assessment & Plan Note (Signed)
Assessment: Phillip Young endorses worsening dyspnea over the last few weeks and an increase in supplemental oxygen. On exam today he is hemodynamically stable using 6L supplemental oxygen. When asked, he notes an increase over the last few weeks.   On further chart review, last [facility] clinic visit he was only requiring 3L. Follow up after our visit he notes an increase in oxygen supplementation only over the last 2 week. Despise dialysis sessions he has remained hypoxic. He was around his mother 2 weeks ago who was diagnosed with a COVID 19 infection.     Plan: -

## 2022-09-21 NOTE — Patient Instructions (Signed)
Thank you, [name] for allowing [place] to provide your care today. Today we discussed .  Heart failure, Kidney disease on dialysis You mentioned not feeling well over the last few weeks. We had a goals of care conversation with you and your uncle. After our discussion you have decided to go home to see if you have any improvement in your systems. Please call our clinic if you feel worse over the coming days and I will call you with your lab results. If you notice your breathing suddenly worsens or any other symptoms arise please call. If you believe you need to be seen immediately please call 911 and go to the closest emergency department.   I have ordered the following labs for you:   Lab Orders         Resp panel by RT-PCR (RSV, Flu A&B, Covid) Anterior Nasal Swab         CBC with Diff         CMP w [place] (STAT/Sunquest-performed on-site)       Referrals ordered today:    Referral Orders         Amb Referral to Palliative Care      I have ordered the following medication/changed the following medications:   Stop the following medications: There are no discontinued medications.   Start the following medications: No orders of the defined types were placed in this encounter.    Follow up:  1 week follow up  or sooner if you feel worse. Present to ED if sudden worsening in breathing    Should you have any questions or concerns please call the internal medicine clinic at [phone].    Sanjuana Letters, D.O. [place]

## 2022-09-21 NOTE — Progress Notes (Unsigned)
CC: Follow up - not feeling well over last few weeks  HPI:  Phillip Young is a 37 y.o. male living with a history stated below and presents today after not feeling well over the the last few weeks and being exposed to covid 2 weeks ago. Please see problem based assessment and plan for additional details.  Past Medical History:  Diagnosis Date   Acute on chronic respiratory failure with hypoxia ([place]) 04/21/2021   Acute on chronic systolic (congestive) heart failure ([name]) 02/26/2020   Biventricular congestive heart failure ([place])    Last Echo 11/2019 at [facility] reveals EF 20%   Class 3 severe obesity due to excess calories with serious comorbidity and body mass index (BMI) of 50.0 to 59.9 in adult [facility]) 02/26/2020   Essential hypertension 02/26/2020   GERD without esophagitis 02/26/2020   Hidradenitis suppurativa 02/26/2020   OSA (obstructive sleep apnea) 02/26/2020   Prediabetes 02/26/2020   Renal disorder     Current Outpatient Medications on File Prior to Visit  Medication Sig Dispense Refill   apixaban (ELIQUIS) 5 MG TABS tablet Take 1 tablet (5 mg total) by mouth 2 (two) times daily. 60 tablet 5   calamine lotion Apply 1 Application topically 2 (two) times daily. 177 mL 0   camphor-menthol (SARNA) lotion Apply topically as needed for itching. 222 mL 0   clindamycin (CLINDAGEL) 1 % gel Apply topically 2 (two) times daily. Apply to hydradenitis wounds 30 g 0   fluticasone furoate-vilanterol (BREO ELLIPTA) 100-25 MCG/ACT AEPB Inhale 1 puff into the lungs daily. 30 each 0   lanthanum (FOSRENOL) 1000 MG chewable tablet Chew 2,000 mg by mouth 3 (three) times daily with meals.     melatonin 5 MG TABS Take 1 tablet (5 mg total) by mouth at bedtime. (Patient not taking: Reported on 09/17/2022) 30 tablet 0   midodrine (PROAMATINE) 5 MG tablet TAKE 1 TABLET(5 MG) BY MOUTH THREE TIMES DAILY WITH MEALS 270 tablet 3   torsemide (DEMADEX) 100 MG tablet TAKE 1 TABLET BY MOUTH 3 TIMES A WEEK  ON NON-DIALYSIS DAYS. 38 tablet 2   No current facility-administered medications on file prior to visit.    Family History  Problem Relation Age of Onset   Heart disease Mother    Hypertension Mother    Pulmonary Hypertension Mother    Drug abuse Father        died due to Heroin overdose    Review of Systems: ROS negative except for what is noted on the assessment and plan.  Vitals:   09/21/22 1544 09/21/22 1556  BP: (!) 147/100 115/65  Pulse: (!) 105 100  Temp: 98.7 F (37.1 C)   SpO2: 100%   Weight: (!) 410 lb (186 kg)   Height: 6' (1.829 m)     Physical Exam: Constitutional: chronically ill appearing, obese HENT: normocephalic atraumatic. Nasal cannula in place Eyes: conjunctiva non-erythematous Neck: supple Cardiovascular: Distant heart sounds, regular rate. Unable to assess JVD with body habitus Pulmonary/Chest: 6L [place], no respiratory distress. Decreased breath sounds at the lung bases. No wheezing, rhonchi, or rales.  Abdominal: soft, non-tender MSK: normal bulk and tone. 2+ lower extremity edema.  Neurological: alert & oriented x 3 Skin: warm extremities  Psych: flat affect  Assessment & Plan:   Advanced care planning/counseling discussion Assessment: Phillip Young presented today after not feeling well since his last hospital admission from 08/13/2022. In the past he has had some improvement after hospitalizations and even was able to  go back to work. Since 1/19 he has had progressively worsening of his chronic joint pain, body aches, fatigue, shortness of breath, weakness, and inability to do the things he usually does. Per nephrology notes he has stopped taking some of his medications and non-adherent to his diet or fluid restriction  On exam today he is chronically ill appearing and hemodynamically stable. He is on 6L [place] in no respiratory distress. He has distant heart sounds and decreased breath sounds at the bases bilaterally. Difficult to assess volume status  but has 2+ lower extremity edema.   He mentions he was transitioned to DNR status after his nephrology appointment last week and they were beginning to discuss hospice. He states he is tired of feeling this way and is ready for hospice. His mother wanted him evaluated today for COVID, he was exposed to her 2 weeks ago when she was positive. His uncle is at bedside and encouraged him to push through and fight.  [name] continued to say his family doesn't understand the suffering he has been going through over the last few weeks. He was hoping to have some improvement. I relayed to him and his uncle that I do not believe Phillip Young will have improvement, but continue to have a steady decline with his end stage heart failure, chronic hypoxic respiratory failure, ESRD, him not being a candidate for advanced therapies (transplant, LVAD, Impella), and continued non-adherence  to his fluid restriction/medications. If he would like to pursue hospice I offered him the options of hospital admission to help with his symptoms and transition to a hospice facility or go home with hospice. He initially mentioned wanting to be admitted and placed in a hospice facility.   After further conversations with his family he would like to go home and see if he has any improvement in his symptoms over time. His family mentions him changing his diet and working on other lifestyle modifications why we assess for reversible causes.   I have reached out to Dr. Joelyn Oms of nephrology to discuss further. Will need to make sure DNR form signed and in place as these are [name]' wishes now. He is agreeable to urgent outpatient palliative referral  Plan: - urgent palliative care referral - will need DNR form officially signed and MOST form completed   Chronic respiratory failure with hypoxia [facility]) Assessment: Phillip Young endorses worsening dyspnea over the last few weeks and an increase in supplemental oxygen. On exam today he  is hemodynamically stable requiring 6L supplemental oxygen and afebrile. He has had associated symptoms of fatigue, body aches, and not feeling well.  After reviewing his nephrology notes from dialysis it appears he is rapidly gaining weight back after dialysis sessions and non-adherent to the recommended diet or fluid restrictions. CBC with minimally elevated WBC, hgb improved and no drastic changes in his BMP. COVID/Flu negative. We will get a chest xray today to assess for any focal opacities or indication of infectious etiology.   Unfortunately believe this is non-adherence to his diet/fluid restrictions and progression of his disease. Per nephology notes he has been gaining 4-5 kg in under 24 hours and stopped taking his medications. He would like to avoid hospitalization if possible. Strict return precaution if breathing worsens, he begins having fever, chills etc.   Plan: -chest xray ordered -dialysis tomorrow to see if helps with breathing -encourage adherence to his diet/fluid restriction and medication adherence -continue goals of care conversations    Patient discussed with Dr.  Guilloud  [employer], D.O. [facility], PGY-3 Phone: [phone] Date 09/22/2022 Time 9:39 AM

## 2022-09-22 ENCOUNTER — Telehealth: Payer: Self-pay

## 2022-09-22 DIAGNOSIS — Z87891 Personal history of nicotine dependence: Secondary | ICD-10-CM | POA: Diagnosis not present

## 2022-09-22 DIAGNOSIS — E1122 Type 2 diabetes mellitus with diabetic chronic kidney disease: Secondary | ICD-10-CM | POA: Diagnosis not present

## 2022-09-22 DIAGNOSIS — I132 Hypertensive heart and chronic kidney disease with heart failure and with stage 5 chronic kidney disease, or end stage renal disease: Secondary | ICD-10-CM | POA: Diagnosis not present

## 2022-09-22 DIAGNOSIS — N186 End stage renal disease: Secondary | ICD-10-CM | POA: Diagnosis not present

## 2022-09-22 DIAGNOSIS — Z992 Dependence on renal dialysis: Secondary | ICD-10-CM | POA: Diagnosis not present

## 2022-09-22 DIAGNOSIS — T8249XA Other complication of vascular dialysis catheter, initial encounter: Secondary | ICD-10-CM | POA: Diagnosis not present

## 2022-09-22 DIAGNOSIS — E8770 Fluid overload, unspecified: Secondary | ICD-10-CM | POA: Diagnosis not present

## 2022-09-22 DIAGNOSIS — I5020 Unspecified systolic (congestive) heart failure: Secondary | ICD-10-CM | POA: Diagnosis not present

## 2022-09-22 DIAGNOSIS — N2581 Secondary hyperparathyroidism of renal origin: Secondary | ICD-10-CM | POA: Diagnosis not present

## 2022-09-22 NOTE — Telephone Encounter (Signed)
(  1:30 pm) PC SW scheduled initial palliative care visit with patient. Clinical team will see patient on 09/27/22 @ 1:30 pm at his home.

## 2022-09-23 DIAGNOSIS — E8770 Fluid overload, unspecified: Secondary | ICD-10-CM | POA: Diagnosis not present

## 2022-09-23 DIAGNOSIS — I132 Hypertensive heart and chronic kidney disease with heart failure and with stage 5 chronic kidney disease, or end stage renal disease: Secondary | ICD-10-CM | POA: Diagnosis not present

## 2022-09-23 DIAGNOSIS — N2581 Secondary hyperparathyroidism of renal origin: Secondary | ICD-10-CM | POA: Diagnosis not present

## 2022-09-23 DIAGNOSIS — E1122 Type 2 diabetes mellitus with diabetic chronic kidney disease: Secondary | ICD-10-CM | POA: Diagnosis not present

## 2022-09-23 DIAGNOSIS — Z992 Dependence on renal dialysis: Secondary | ICD-10-CM | POA: Diagnosis not present

## 2022-09-23 DIAGNOSIS — N186 End stage renal disease: Secondary | ICD-10-CM | POA: Diagnosis not present

## 2022-09-23 DIAGNOSIS — T8249XA Other complication of vascular dialysis catheter, initial encounter: Secondary | ICD-10-CM | POA: Diagnosis not present

## 2022-09-23 DIAGNOSIS — Z87891 Personal history of nicotine dependence: Secondary | ICD-10-CM | POA: Diagnosis not present

## 2022-09-23 DIAGNOSIS — I5020 Unspecified systolic (congestive) heart failure: Secondary | ICD-10-CM | POA: Diagnosis not present

## 2022-09-23 NOTE — Progress Notes (Signed)
Internal Medicine Clinic Attending  Case discussed with Dr. Katsadouros  At the time of the visit.  We reviewed the resident's history and exam and pertinent patient test results.  I agree with the assessment, diagnosis, and plan of care documented in the resident's note.  

## 2022-09-24 ENCOUNTER — Other Ambulatory Visit: Payer: Self-pay | Admitting: *Deleted

## 2022-09-24 DIAGNOSIS — I5023 Acute on chronic systolic (congestive) heart failure: Secondary | ICD-10-CM | POA: Diagnosis not present

## 2022-09-24 DIAGNOSIS — I5082 Biventricular heart failure: Secondary | ICD-10-CM | POA: Diagnosis not present

## 2022-09-24 DIAGNOSIS — Z992 Dependence on renal dialysis: Secondary | ICD-10-CM

## 2022-09-25 DIAGNOSIS — T8249XA Other complication of vascular dialysis catheter, initial encounter: Secondary | ICD-10-CM | POA: Diagnosis not present

## 2022-09-25 DIAGNOSIS — I132 Hypertensive heart and chronic kidney disease with heart failure and with stage 5 chronic kidney disease, or end stage renal disease: Secondary | ICD-10-CM | POA: Diagnosis not present

## 2022-09-25 DIAGNOSIS — I5020 Unspecified systolic (congestive) heart failure: Secondary | ICD-10-CM | POA: Diagnosis not present

## 2022-09-25 DIAGNOSIS — E8770 Fluid overload, unspecified: Secondary | ICD-10-CM | POA: Diagnosis not present

## 2022-09-25 DIAGNOSIS — Z992 Dependence on renal dialysis: Secondary | ICD-10-CM | POA: Diagnosis not present

## 2022-09-25 DIAGNOSIS — Z87891 Personal history of nicotine dependence: Secondary | ICD-10-CM | POA: Diagnosis not present

## 2022-09-25 DIAGNOSIS — N186 End stage renal disease: Secondary | ICD-10-CM | POA: Diagnosis not present

## 2022-09-25 DIAGNOSIS — N2581 Secondary hyperparathyroidism of renal origin: Secondary | ICD-10-CM | POA: Diagnosis not present

## 2022-09-25 DIAGNOSIS — E1122 Type 2 diabetes mellitus with diabetic chronic kidney disease: Secondary | ICD-10-CM | POA: Diagnosis not present

## 2022-09-27 ENCOUNTER — Other Ambulatory Visit: Payer: BC Managed Care – PPO

## 2022-09-28 DIAGNOSIS — I5020 Unspecified systolic (congestive) heart failure: Secondary | ICD-10-CM | POA: Diagnosis not present

## 2022-09-28 DIAGNOSIS — N2581 Secondary hyperparathyroidism of renal origin: Secondary | ICD-10-CM | POA: Diagnosis not present

## 2022-09-28 DIAGNOSIS — E1122 Type 2 diabetes mellitus with diabetic chronic kidney disease: Secondary | ICD-10-CM | POA: Diagnosis not present

## 2022-09-28 DIAGNOSIS — E8770 Fluid overload, unspecified: Secondary | ICD-10-CM | POA: Diagnosis not present

## 2022-09-28 DIAGNOSIS — N186 End stage renal disease: Secondary | ICD-10-CM | POA: Diagnosis not present

## 2022-09-28 DIAGNOSIS — T8249XA Other complication of vascular dialysis catheter, initial encounter: Secondary | ICD-10-CM | POA: Diagnosis not present

## 2022-09-28 DIAGNOSIS — Z87891 Personal history of nicotine dependence: Secondary | ICD-10-CM | POA: Diagnosis not present

## 2022-09-28 DIAGNOSIS — I132 Hypertensive heart and chronic kidney disease with heart failure and with stage 5 chronic kidney disease, or end stage renal disease: Secondary | ICD-10-CM | POA: Diagnosis not present

## 2022-09-28 DIAGNOSIS — Z992 Dependence on renal dialysis: Secondary | ICD-10-CM | POA: Diagnosis not present

## 2022-09-29 DIAGNOSIS — E8770 Fluid overload, unspecified: Secondary | ICD-10-CM | POA: Diagnosis not present

## 2022-09-29 DIAGNOSIS — Z87891 Personal history of nicotine dependence: Secondary | ICD-10-CM | POA: Diagnosis not present

## 2022-09-29 DIAGNOSIS — N186 End stage renal disease: Secondary | ICD-10-CM | POA: Diagnosis not present

## 2022-09-29 DIAGNOSIS — N2581 Secondary hyperparathyroidism of renal origin: Secondary | ICD-10-CM | POA: Diagnosis not present

## 2022-09-29 DIAGNOSIS — I5020 Unspecified systolic (congestive) heart failure: Secondary | ICD-10-CM | POA: Diagnosis not present

## 2022-09-29 DIAGNOSIS — Z992 Dependence on renal dialysis: Secondary | ICD-10-CM | POA: Diagnosis not present

## 2022-09-29 DIAGNOSIS — T8249XA Other complication of vascular dialysis catheter, initial encounter: Secondary | ICD-10-CM | POA: Diagnosis not present

## 2022-09-29 DIAGNOSIS — I132 Hypertensive heart and chronic kidney disease with heart failure and with stage 5 chronic kidney disease, or end stage renal disease: Secondary | ICD-10-CM | POA: Diagnosis not present

## 2022-09-29 DIAGNOSIS — E1122 Type 2 diabetes mellitus with diabetic chronic kidney disease: Secondary | ICD-10-CM | POA: Diagnosis not present

## 2022-09-30 DIAGNOSIS — Z87891 Personal history of nicotine dependence: Secondary | ICD-10-CM | POA: Diagnosis not present

## 2022-09-30 DIAGNOSIS — I132 Hypertensive heart and chronic kidney disease with heart failure and with stage 5 chronic kidney disease, or end stage renal disease: Secondary | ICD-10-CM | POA: Diagnosis not present

## 2022-09-30 DIAGNOSIS — T8249XA Other complication of vascular dialysis catheter, initial encounter: Secondary | ICD-10-CM | POA: Diagnosis not present

## 2022-09-30 DIAGNOSIS — N2581 Secondary hyperparathyroidism of renal origin: Secondary | ICD-10-CM | POA: Diagnosis not present

## 2022-09-30 DIAGNOSIS — E1122 Type 2 diabetes mellitus with diabetic chronic kidney disease: Secondary | ICD-10-CM | POA: Diagnosis not present

## 2022-09-30 DIAGNOSIS — I5020 Unspecified systolic (congestive) heart failure: Secondary | ICD-10-CM | POA: Diagnosis not present

## 2022-09-30 DIAGNOSIS — Z992 Dependence on renal dialysis: Secondary | ICD-10-CM | POA: Diagnosis not present

## 2022-09-30 DIAGNOSIS — N186 End stage renal disease: Secondary | ICD-10-CM | POA: Diagnosis not present

## 2022-09-30 DIAGNOSIS — E8770 Fluid overload, unspecified: Secondary | ICD-10-CM | POA: Diagnosis not present

## 2022-10-01 ENCOUNTER — Ambulatory Visit: Payer: Self-pay

## 2022-10-02 NOTE — Patient Instructions (Signed)
Visit Information  Thank you for taking time to visit with me today. Please don't hesitate to contact me if I can be of assistance to you.   Following are the goals we discussed today:   Goals Addressed             This Visit's Progress    I want help to deal with my daily pain and fatigue.       Patient Goals/Self Care Activities: -Patient/Caregiver will self-administer medications as prescribed as evidenced by self-report/primary caregiver report  -Patient/Caregiver will attend all scheduled provider appointments as evidenced by clinician review of documented attendance to scheduled appointments and patient/caregiver report -Patient/Caregiver will call pharmacy for medication refills as evidenced by patient report and review of pharmacy fill history as appropriate -Patient/Caregiver will call provider office for new concerns or questions as evidenced by review of documented incoming telephone call notes and patient report -Patient/Caregiver verbalizes understanding of plan -Patient/Caregiver will focus on medication adherence by taking medications as prescribed  -Keep your airway clear from mucus build up  -Self assess breathing and make appointment with provider if you have beenany breathing issues -Utilize infection prevention strategies to reduce risk of respiratory infection   patient will demonstrate use of different relaxation  skills and/or diversional activities to assist with pain reduction (distraction, imagery, relaxation, massage, acupressure, TENS, heat, and cold application. and patient will verbalize acceptable level of pain relief and ability to engage in desired activities  -The patient will try to follow his fluid restriction         Our next appointment is by telephone on 11/02/22 at 130 pm  Please call the care guide team at [phone] if you need to cancel or reschedule your appointment.   If you are experiencing a Mental Health or [place] or  need someone to talk to, please call 1-800-273-TALK (toll free, 24 hour hotline)  Patient verbalizes understanding of instructions and care plan provided today and agrees to view in [place]. Active MyChart status and patient understanding of how to access instructions and care plan via MyChart confirmed with patient.     Lazaro Arms RN, BSN, [facility]   Phone: [phone]

## 2022-10-02 NOTE — Patient Outreach (Signed)
  Care Coordination   Follow Up Visit Note   10/02/2022 Late Entry Name: Langston Tuberville MRN: [id number] DOB: July 09, 1986  Kadar Chance is a 37 y.o. year old male who sees [name], MD for primary care. I spoke with  [name] by phone today.  What matters to the patients health and wellness today?  Mr. Honea reported feeling better. He no longer experiences the sick feeling, and he believes it is time that made a change. He has more energy and currently requires five liters of oxygen. Although he still experiences some edema in his lower extremities, it is not as severe. Mr. Renfroe acknowledges that he has fluid restrictions but admits to not always following them. Nevertheless, he feels that he has improved overall. I encouraged him to continue taking it one day at a time as it will help him feel better.    Goals Addressed             This Visit's Progress    I want help to deal with my daily pain and fatigue.       Patient Goals/Self Care Activities: -Patient/Caregiver will self-administer medications as prescribed as evidenced by self-report/primary caregiver report  -Patient/Caregiver will attend all scheduled provider appointments as evidenced by clinician review of documented attendance to scheduled appointments and patient/caregiver report -Patient/Caregiver will call pharmacy for medication refills as evidenced by patient report and review of pharmacy fill history as appropriate -Patient/Caregiver will call provider office for new concerns or questions as evidenced by review of documented incoming telephone call notes and patient report -Patient/Caregiver verbalizes understanding of plan -Patient/Caregiver will focus on medication adherence by taking medications as prescribed  -Keep your airway clear from mucus build up  -Self assess breathing and make appointment with provider if you have beenany breathing issues -Utilize infection prevention strategies to reduce  risk of respiratory infection   patient will demonstrate use of different relaxation  skills and/or diversional activities to assist with pain reduction (distraction, imagery, relaxation, massage, acupressure, TENS, heat, and cold application. and patient will verbalize acceptable level of pain relief and ability to engage in desired activities  -The patient will try to follow his fluid restriction         SDOH assessments and interventions completed:  No     Care Coordination Interventions:  Yes, provided   Interventions Today    Flowsheet Row Most Recent Value  Chronic Disease   Chronic disease during today's visit Chronic Kidney Disease/End Stage Renal Disease (ESRD)  General Interventions   General Interventions Discussed/Reviewed General Interventions Discussed, General Interventions Reviewed  Education Interventions   Education Provided Provided Education  Provided Verbal Education On --  [discused fluid restriction and lower his intake]  Nutrition Interventions   Nutrition Discussed/Reviewed Fluid intake        Follow up plan: Follow up call scheduled for 11/02/22 130 pm    Encounter Outcome:  Pt. Visit Completed   Lazaro Arms RN, BSN, [facility]   Phone: [phone]

## 2022-10-04 ENCOUNTER — Ambulatory Visit (INDEPENDENT_AMBULATORY_CARE_PROVIDER_SITE_OTHER)
Admission: RE | Admit: 2022-10-04 | Discharge: 2022-10-04 | Disposition: A | Discharge status: Home / Self Care | Payer: BC Managed Care – PPO | Source: Ambulatory Visit | Attending: Surgery | Admitting: Surgery

## 2022-10-04 ENCOUNTER — Encounter: Payer: Self-pay | Admitting: Surgery

## 2022-10-04 ENCOUNTER — Ambulatory Visit (INDEPENDENT_AMBULATORY_CARE_PROVIDER_SITE_OTHER): Payer: BC Managed Care – PPO | Admitting: Surgery

## 2022-10-04 ENCOUNTER — Ambulatory Visit (HOSPITAL_COMMUNITY)
Admission: RE | Admit: 2022-10-04 | Discharge: 2022-10-04 | Disposition: A | Discharge status: Home / Self Care | Payer: BC Managed Care – PPO | Source: Ambulatory Visit | Attending: Surgery | Admitting: Surgery

## 2022-10-04 ENCOUNTER — Other Ambulatory Visit: Payer: Self-pay

## 2022-10-04 VITALS — BP 118/79 | HR 95 | Temp 98.2°F | Resp 24 | Ht 72.0 in | Wt >= 6400 oz

## 2022-10-04 DIAGNOSIS — Z992 Dependence on renal dialysis: Secondary | ICD-10-CM | POA: Diagnosis not present

## 2022-10-04 DIAGNOSIS — N186 End stage renal disease: Secondary | ICD-10-CM

## 2022-10-04 NOTE — Progress Notes (Signed)
Vascular and Vein Specialist of [facility]  Patient name: Phillip Young MRN: [id number] DOB: 1986-02-25 Sex: male   REASON FOR VISIT:    Follow up dialysis access  HISOTRY OF PRESENT ILLNESS:    Phillip Young is a 37 y.o. male who is status post left brachiocephalic fistula on [id number].  I last saw him and March 2023 and the fistula was occluded.  I was planning a basilic vein fistula, however this never occluded.  He is now on dialysis, Tuesday Thursday Saturday via a right sided catheter  The patient is no longer taking his Eliquis.  He has a history of congestive heart failure   PAST MEDICAL HISTORY:   Past Medical History:  Diagnosis Date   Acute on chronic respiratory failure with hypoxia ([place]) 04/21/2021   Acute on chronic systolic (congestive) heart failure ([name]) 02/26/2020   Biventricular congestive heart failure ([place])    Last Echo 11/2019 at [facility] reveals EF 20%   Class 3 severe obesity due to excess calories with serious comorbidity and body mass index (BMI) of 50.0 to 59.9 in adult [facility]) 02/26/2020   Essential hypertension 02/26/2020   GERD without esophagitis 02/26/2020   Hidradenitis suppurativa 02/26/2020   OSA (obstructive sleep apnea) 02/26/2020   Prediabetes 02/26/2020   Renal disorder      FAMILY HISTORY:   Family History  Problem Relation Age of Onset   Heart disease Mother    Hypertension Mother    Pulmonary Hypertension Mother    Drug abuse Father        died due to Heroin overdose    SOCIAL HISTORY:   Social History   Tobacco Use   Smoking status: Former    Packs/day: 1.00    Types: Cigarettes    Quit date: 2019    Years since quitting: 5.1    Passive exposure: Current   Smokeless tobacco: Former   Tobacco comments:    quit in 2019  Substance Use Topics   Alcohol use: Never     ALLERGIES:   Allergies  Allergen Reactions   Amiodarone Other (See Comments)    Suspicion for amiodarone  lung/hepatotoxicity  Other Reaction(s): Suspicion amiodarone toxicity   Coreg [Carvedilol] Shortness Of Breath and Diarrhea    Wheezing    Heparin Other (See Comments)    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 60 tablet 5   calamine lotion Apply 1 Application topically 2 (two) times daily. 177 mL 0   camphor-menthol (SARNA) lotion Apply topically as needed for itching. 222 mL 0   clindamycin (CLINDAGEL) 1 % gel Apply topically 2 (two) times daily. Apply to hydradenitis wounds 30 g 0   fluticasone furoate-vilanterol (BREO ELLIPTA) 100-25 MCG/ACT AEPB Inhale 1 puff into the lungs daily. 30 each 0   lanthanum (FOSRENOL) 1000 MG chewable tablet Chew 2,000 mg by mouth 3 (three) times daily with meals.     melatonin 5 MG TABS Take 1 tablet (5 mg total) by mouth at bedtime. 30 tablet 0   midodrine (PROAMATINE) 5 MG tablet TAKE 1 TABLET(5 MG) BY MOUTH THREE TIMES DAILY WITH MEALS 270 tablet 3   torsemide (DEMADEX) 100 MG tablet TAKE 1 TABLET BY MOUTH 3 TIMES A WEEK ON  NON-DIALYSIS DAYS. 38 tablet 2   No current facility-administered medications for this visit.    REVIEW OF SYSTEMS:   '[X]'$  denotes positive finding, '[ ]'$  denotes negative finding Cardiac  Comments:  Chest pain or chest pressure:    Shortness of breath upon exertion:    Short of breath when lying flat:    Irregular heart rhythm:        Vascular    Pain in calf, thigh, or hip brought on by ambulation:    Pain in feet at night that wakes you up from your sleep:     Blood clot in your veins:    Leg swelling:         Pulmonary    Oxygen at home: x   Productive cough:     Wheezing:         Neurologic    Sudden weakness in arms or legs:     Sudden numbness in arms or legs:      Sudden onset of difficulty speaking or slurred speech:    Temporary loss of vision in one eye:     Problems with dizziness:         Gastrointestinal    Blood in stool:     Vomited blood:         Genitourinary    Burning when urinating:     Blood in urine:        Psychiatric    Major depression:         Hematologic    Bleeding problems:    Problems with blood clotting too easily:        Skin    Rashes or ulcers:        Constitutional    Fever or chills:      PHYSICAL EXAM:   Vitals:   10/04/22 1457  BP: 118/79  Pulse: 95  Resp: (!) 24  Temp: 98.2 F (36.8 C)  SpO2: 91%  Weight: (!) 435 lb (197.3 kg)  Height: 6' (1.829 m)    GENERAL: The patient is a well-nourished male, in no acute distress. The vital signs are documented above. CARDIAC: There is a regular rate and rhythm.  PULMONARY: Non-labored respirations, on nasal cannula oxygen ABDOMEN: Soft and non-tender with normal pitched bowel sounds.  MUSCULOSKELETAL: There are no major deformities or cyanosis. NEUROLOGIC: No focal weakness or paresthesias are detected. SKIN: There are no ulcers or rashes noted. PSYCHIATRIC: The patient has a normal affect.  STUDIES:   I have reviewed the following: +--------------+-------------+----------+---------+  Left Cephalic Diameter (cm)Depth (cm)Findings   +--------------+-------------+----------+---------+  Shoulder         0.61                          +--------------+-------------+----------+---------+  Prox upper arm    0.58                          +--------------+-------------+----------+---------+  Mid upper arm     0.24               branching  +--------------+-------------+----------+---------+  Dist upper arm    0.20                          +--------------+-------------+----------+---------+   +-----------------+-------------+----------+---------+  Left Basilic     Diameter (cm)Depth (cm)Findings    +-----------------+-------------+----------+---------+  Prox upper arm  0.59                          +-----------------+-------------+----------+---------+  Mid upper arm        0.40               branching  +-----------------+-------------+----------+---------+  Dist upper arm       0.51                          +-----------------+-------------+----------+---------+  Antecubital fossa    0.38                          +-----------------+-------------+----------+---------+  Prox forearm         0.36               branching  +-----------------+-------------+----------+---------+     MEDICAL ISSUES:   Incisional disease: I discussed proceeding with a left for stage basilic vein fistula.  This will be done in 2 separate operations.  The first is scheduled for this Friday.  I discussed the risk of not maturity.  All questions were answered.    Leia Alf, MD, FACS Vascular and Vein Specialists of [facility] [phone] Pager [phone]

## 2022-10-05 DIAGNOSIS — I132 Hypertensive heart and chronic kidney disease with heart failure and with stage 5 chronic kidney disease, or end stage renal disease: Secondary | ICD-10-CM | POA: Diagnosis not present

## 2022-10-05 DIAGNOSIS — N186 End stage renal disease: Secondary | ICD-10-CM | POA: Diagnosis not present

## 2022-10-05 DIAGNOSIS — T8249XA Other complication of vascular dialysis catheter, initial encounter: Secondary | ICD-10-CM | POA: Diagnosis not present

## 2022-10-05 DIAGNOSIS — E1122 Type 2 diabetes mellitus with diabetic chronic kidney disease: Secondary | ICD-10-CM | POA: Diagnosis not present

## 2022-10-05 DIAGNOSIS — E8770 Fluid overload, unspecified: Secondary | ICD-10-CM | POA: Diagnosis not present

## 2022-10-05 DIAGNOSIS — I5020 Unspecified systolic (congestive) heart failure: Secondary | ICD-10-CM | POA: Diagnosis not present

## 2022-10-05 DIAGNOSIS — N2581 Secondary hyperparathyroidism of renal origin: Secondary | ICD-10-CM | POA: Diagnosis not present

## 2022-10-05 DIAGNOSIS — Z992 Dependence on renal dialysis: Secondary | ICD-10-CM | POA: Diagnosis not present

## 2022-10-05 DIAGNOSIS — Z87891 Personal history of nicotine dependence: Secondary | ICD-10-CM | POA: Diagnosis not present

## 2022-10-06 ENCOUNTER — Encounter (HOSPITAL_COMMUNITY): Payer: Self-pay | Admitting: Surgery

## 2022-10-06 ENCOUNTER — Other Ambulatory Visit: Payer: Self-pay

## 2022-10-06 DIAGNOSIS — Z87891 Personal history of nicotine dependence: Secondary | ICD-10-CM | POA: Diagnosis not present

## 2022-10-06 DIAGNOSIS — I5020 Unspecified systolic (congestive) heart failure: Secondary | ICD-10-CM | POA: Diagnosis not present

## 2022-10-06 DIAGNOSIS — E8770 Fluid overload, unspecified: Secondary | ICD-10-CM | POA: Diagnosis not present

## 2022-10-06 DIAGNOSIS — I5023 Acute on chronic systolic (congestive) heart failure: Secondary | ICD-10-CM | POA: Diagnosis not present

## 2022-10-06 DIAGNOSIS — Z992 Dependence on renal dialysis: Secondary | ICD-10-CM | POA: Diagnosis not present

## 2022-10-06 DIAGNOSIS — T8249XA Other complication of vascular dialysis catheter, initial encounter: Secondary | ICD-10-CM | POA: Diagnosis not present

## 2022-10-06 DIAGNOSIS — E1122 Type 2 diabetes mellitus with diabetic chronic kidney disease: Secondary | ICD-10-CM | POA: Diagnosis not present

## 2022-10-06 DIAGNOSIS — I509 Heart failure, unspecified: Secondary | ICD-10-CM | POA: Diagnosis not present

## 2022-10-06 DIAGNOSIS — N186 End stage renal disease: Secondary | ICD-10-CM | POA: Diagnosis not present

## 2022-10-06 DIAGNOSIS — J9610 Chronic respiratory failure, unspecified whether with hypoxia or hypercapnia: Secondary | ICD-10-CM | POA: Diagnosis not present

## 2022-10-06 DIAGNOSIS — I132 Hypertensive heart and chronic kidney disease with heart failure and with stage 5 chronic kidney disease, or end stage renal disease: Secondary | ICD-10-CM | POA: Diagnosis not present

## 2022-10-06 DIAGNOSIS — E662 Morbid (severe) obesity with alveolar hypoventilation: Secondary | ICD-10-CM | POA: Diagnosis not present

## 2022-10-06 DIAGNOSIS — N2581 Secondary hyperparathyroidism of renal origin: Secondary | ICD-10-CM | POA: Diagnosis not present

## 2022-10-06 NOTE — Progress Notes (Signed)
PCP - [name], MD Cardiologist - [name], MD  PPM/ICD - denies  Chest x-ray - 08/05/22 EKG - 08/09/22 ECHO - 03/24/22 Cardiac Cath - 03/06/21  BPAP - yes  Fasting Blood Sugar - n/a  Blood Thinner Instructions: Eliquis - last dose 10/05/22 per patient Patient was instructed: As of today, STOP taking any Aspirin (unless otherwise instructed by your surgeon) Aleve, Naproxen, Ibuprofen, Motrin, Advil, Goody's, BC's, all herbal medications, fish oil, and all vitamins.  ERAS Protcol - n/a  COVID TEST- n/a  Anesthesia review: yes - cardiac history  Patient verbally denies any shortness of breath, fever, cough and chest pain during phone call   -------------  SDW INSTRUCTIONS given:  Your procedure is scheduled on Friday, March 15th, 2024.  Report to [facility] Main Entrance "A" at 10:30 A.M., and check in at the Admitting office.  Call this number if you have problems the morning of surgery:  [phone]   Remember:  Do not eat or drink after midnight the night before your surgery    Take these medicines the morning of surgery with A SIP OF WATER: Midodrine, Ellipta  Please bring the inhaler with you the day of surgery.    The day of surgery:                     Do not wear jewelry,             Do not wear lotions, powders, colognes, or deodorant.            Men may shave face and neck.            Do not bring valuables to the hospital.            [facility] is not responsible for any belongings or valuables.  Do NOT Smoke (Tobacco/Vaping) 24 hours prior to your procedure If you use a CPAP at night, you may bring all equipment for your overnight stay.   Contacts, glasses, dentures or bridgework may not be worn into surgery.      For patients admitted to the hospital, discharge time will be determined by your treatment team.   Patients discharged the day of surgery will not be allowed to drive home, and someone needs to stay with them for 24  hours.    Special instructions:   [place]- Preparing For Surgery  Before surgery, you can play an important role. Because skin is not sterile, your skin needs to be as free of germs as possible. You can reduce the number of germs on your skin by washing with CHG (chlorahexidine gluconate) Soap before surgery.  CHG is an antiseptic cleaner which kills germs and bonds with the skin to continue killing germs even after washing.    Oral Hygiene is also important to reduce your risk of infection.  Remember - BRUSH YOUR TEETH THE MORNING OF SURGERY WITH YOUR REGULAR TOOTHPASTE  Please do not use if you have an allergy to CHG or antibacterial soaps. If your skin becomes reddened/irritated stop using the CHG.  Do not shave (including legs and underarms) for at least 48 hours prior to first CHG shower. It is OK to shave your face.  Please follow these instructions carefully.   Shower the NIGHT BEFORE SURGERY and the MORNING OF SURGERY with DIAL Soap.   Pat yourself dry with a CLEAN TOWEL.  Wear CLEAN PAJAMAS to bed the night before surgery  Place CLEAN SHEETS on your bed  the night of your first shower and DO NOT SLEEP WITH PETS.   Day of Surgery: Please shower morning of surgery  Wear Clean/Comfortable clothing the morning of surgery Do not apply any deodorants/lotions.   Remember to brush your teeth WITH YOUR REGULAR TOOTHPASTE.   Questions were answered. Patient verbalized understanding of instructions.

## 2022-10-07 ENCOUNTER — Ambulatory Visit (HOSPITAL_COMMUNITY): Payer: BC Managed Care – PPO | Admitting: Physician Assistant

## 2022-10-07 NOTE — Anesthesia Preprocedure Evaluation (Deleted)
Anesthesia Evaluation    Reviewed: Allergy & Precautions, Patient's Chart, lab work & pertinent test results, Unable to perform ROS - Chart review only  Airway        Dental   Pulmonary neg pulmonary ROS, former smoker          Cardiovascular hypertension, pulmonary hypertension+CHF    Echo   1. Left ventricular ejection fraction, by estimation, is 20 to 25%. The left ventricle has severely decreased function. The left ventricle demonstrates global hypokinesis. The left ventricular internal cavity size was moderately dilated. Left ventricular diastolic parameters were normal.   2. Right ventricular systolic function is normal. The right ventricular size is mildly enlarged. There is severely elevated pulmonary artery systolic pressure. The estimated right ventricular systolic pressure is 123456 mmHg.   3. Left atrial size was mildly dilated.   4. Right atrial size was mildly dilated.   5. The mitral valve is normal in structure. Mild to moderate mitral valve regurgitation. No evidence of mitral stenosis.   6. The aortic valve is normal in structure. Aortic valve regurgitation is not visualized. No aortic stenosis is present.   7. The inferior vena cava is normal in size with greater than 50% respiratory variability, suggesting right atrial pressure of 3 mmHg.     Neuro/Psych negative neurological ROS     GI/Hepatic negative GI ROS, Neg liver ROS,,,  Endo/Other  negative endocrine ROS    Renal/GU negative Renal ROS     Musculoskeletal negative musculoskeletal ROS (+)    Abdominal   Peds  Hematology negative hematology ROS (+)   Anesthesia Other Findings   Reproductive/Obstetrics                              Anesthesia Physical Anesthesia Plan  ASA: 4  Anesthesia Plan:    Post-op Pain Management:    Induction:   PONV Risk Score and Plan:   Airway Management Planned:   Additional  Equipment:   Intra-op Plan:   Post-operative Plan:   Informed Consent:   Plan Discussed with:   Anesthesia Plan Comments: (PAT note by Karoline Caldwell, PA-C: 37 year old male with pertinent history including OSA/OHS, HFrEF (20-25%), BMI 60, ESRD on HD, heparin-induced thrombocytopenia, pAF/AF s/p multiple CV attempts and ablation 12/23, amiodarone lung toxicity, and chronic hypoxic respiratory failure on 3-4L supplemental oxygen.  Patient has had multiple recent and prolonged hospitalizations for volume overload in setting of end-stage renal disease, advanced heart failure, and arrhythmia.  Most recent admission 1/11 through 08/13/2022.  Fluid overload was managed with serial HD.  He had moderate improvement in his shortness of breath and lower extremity edema.  He was evaluated by cardiology felt not to be a candidate for advanced therapies.  He has anemia of chronic disease with baseline hemoglobin around 7.  He is maintained on Aranesp and Ferrlecit per nephrology.  He was recommended to undergo dialysis 4 times per week at discharge.  He is currently dialyzing through a right-sided TDC.  Seen in follow-up by PCP on 09/21/2022 and noted to have declining health.  Per note, since most recent discharge he has had worsening of his chronic pain, body aches, fatigue, shortness of breath, weakness, and inability to do normal ADLs.  He had 2+ lower extremity edema on exam.  He reports he has discontinued most of his medications and is not adherent to diet and fluid restrictions.  He was noted to be on 6 L  via [place] to maintain O2 sats.  Referral was made to palliative care.  There was also discussion of pursuing hospice.    CBC from 09/21/2022 showed stable anemia with hemoglobin 9.5.  Patient will need day of surgery labs and evaluation.  Reviewed history with anesthesiologist Dr. Ermalene Postin. Pt will be evalauted DOS by assigned anesthesiologist.   EKG 08/09/2022: Atrial flutter with 2 to 1 block.  Rate 125.  Marked ST abnormality, possible inferior subendocardial injury  TTE 07/07/2022 (care everywhere): SUMMARY  The left ventricle is severely dilated.  LV ejection fraction = 25-30%.  Left ventricular systolic function is moderate to severely reduced.  Unable to fully assess LV regional wall motion  The right ventricle is not well visualized.  There is mild tricuspid regurgitation.  Mild pulmonary hypertension.  The IVC was moderately dilated.  There is no pericardial effusion.    )         Anesthesia Quick Evaluation

## 2022-10-07 NOTE — Progress Notes (Signed)
Anesthesia Chart Review: Same day workup  37 year old male with pertinent history including OSA/OHS, HFrEF (20-25%), BMI 60, ESRD on HD, heparin-induced thrombocytopenia, pAF/AF s/p multiple CV attempts and ablation 12/23, amiodarone lung toxicity, and chronic hypoxic respiratory failure on 3-4L supplemental oxygen.  Patient has had multiple recent and prolonged hospitalizations for volume overload in setting of end-stage renal disease, advanced heart failure, and arrhythmia.  Most recent admission 1/11 through 08/13/2022.  Fluid overload was managed with serial HD.  He had moderate improvement in his shortness of breath and lower extremity edema.  He was evaluated by cardiology felt not to be a candidate for advanced therapies.  He has anemia of chronic disease with baseline hemoglobin around 7.  He is maintained on Aranesp and Ferrlecit per nephrology.  He was recommended to undergo dialysis 4 times per week at discharge.  He is currently dialyzing through a right-sided TDC.  Seen in follow-up by PCP on 09/21/2022 and noted to have declining health.  Per note, since most recent discharge he has had worsening of his chronic pain, body aches, fatigue, shortness of breath, weakness, and inability to do normal ADLs.  He had 2+ lower extremity edema on exam.  He reports he has discontinued most of his medications and is not adherent to diet and fluid restrictions.  He was noted to be on 6 L via [place] to maintain O2 sats.  Referral was made to palliative care.  There was also discussion of pursuing hospice.    CBC from 09/21/2022 showed stable anemia with hemoglobin 9.5.  Patient will need day of surgery labs and evaluation.  Reviewed history with anesthesiologist Dr. Ermalene Postin. Pt will be evalauted DOS by assigned anesthesiologist.   EKG 08/09/2022: Atrial flutter with 2 to 1 block.  Rate 125. Marked ST abnormality, possible inferior subendocardial injury  TTE 07/07/2022 (care everywhere): SUMMARY  The left  ventricle is severely dilated.  LV ejection fraction = 25-30%.  Left ventricular systolic function is moderate to severely reduced.  Unable to fully assess LV regional wall motion  The right ventricle is not well visualized.  There is mild tricuspid regurgitation.  Mild pulmonary hypertension.  The IVC was moderately dilated.  There is no pericardial effusion.     Wynonia Musty [facility] Short Stay Center/Anesthesiology Phone [phone] 10/07/2022 3:06 PM

## 2022-10-08 ENCOUNTER — Encounter (HOSPITAL_COMMUNITY): Payer: Self-pay | Admitting: Surgery

## 2022-10-08 ENCOUNTER — Other Ambulatory Visit: Payer: BC Managed Care – PPO

## 2022-10-08 ENCOUNTER — Encounter (HOSPITAL_COMMUNITY)
Admission: RE | Disposition: A | Discharge status: Home / Self Care | Payer: Self-pay | Source: Home / Self Care | Attending: Surgery

## 2022-10-08 ENCOUNTER — Ambulatory Visit (HOSPITAL_COMMUNITY)
Admission: RE | Admit: 2022-10-08 | Discharge: 2022-10-08 | Disposition: A | Discharge status: Home / Self Care | Payer: BC Managed Care – PPO | Attending: Surgery | Admitting: Surgery

## 2022-10-08 ENCOUNTER — Other Ambulatory Visit: Payer: Self-pay

## 2022-10-08 DIAGNOSIS — D631 Anemia in chronic kidney disease: Secondary | ICD-10-CM | POA: Diagnosis not present

## 2022-10-08 DIAGNOSIS — Z992 Dependence on renal dialysis: Secondary | ICD-10-CM | POA: Insufficient documentation

## 2022-10-08 DIAGNOSIS — I132 Hypertensive heart and chronic kidney disease with heart failure and with stage 5 chronic kidney disease, or end stage renal disease: Secondary | ICD-10-CM | POA: Insufficient documentation

## 2022-10-08 DIAGNOSIS — I5022 Chronic systolic (congestive) heart failure: Secondary | ICD-10-CM | POA: Diagnosis not present

## 2022-10-08 DIAGNOSIS — J9611 Chronic respiratory failure with hypoxia: Secondary | ICD-10-CM | POA: Diagnosis not present

## 2022-10-08 DIAGNOSIS — T462X5A Adverse effect of other antidysrhythmic drugs, initial encounter: Secondary | ICD-10-CM | POA: Diagnosis not present

## 2022-10-08 DIAGNOSIS — Z9981 Dependence on supplemental oxygen: Secondary | ICD-10-CM | POA: Insufficient documentation

## 2022-10-08 DIAGNOSIS — N186 End stage renal disease: Secondary | ICD-10-CM | POA: Diagnosis not present

## 2022-10-08 DIAGNOSIS — G4733 Obstructive sleep apnea (adult) (pediatric): Secondary | ICD-10-CM | POA: Diagnosis not present

## 2022-10-08 DIAGNOSIS — Z538 Procedure and treatment not carried out for other reasons: Secondary | ICD-10-CM | POA: Diagnosis not present

## 2022-10-08 DIAGNOSIS — I480 Paroxysmal atrial fibrillation: Secondary | ICD-10-CM | POA: Insufficient documentation

## 2022-10-08 DIAGNOSIS — Z515 Encounter for palliative care: Secondary | ICD-10-CM

## 2022-10-08 HISTORY — DX: Pneumonia, unspecified organism: J18.9

## 2022-10-08 HISTORY — DX: Prediabetes: R73.03

## 2022-10-08 LAB — SURGICAL PCR SCREEN
MRSA, PCR: NEGATIVE
Staphylococcus aureus: POSITIVE — AB

## 2022-10-08 LAB — POCT I-STAT, CHEM 8
BUN: 60 mg/dL — ABNORMAL HIGH (ref 6–20)
Calcium, Ion: 1.25 mmol/L (ref 1.15–1.40)
Chloride: 100 mmol/L (ref 98–111)
Creatinine, Ser: 11.7 mg/dL — ABNORMAL HIGH (ref 0.61–1.24)
Glucose, Bld: 104 mg/dL — ABNORMAL HIGH (ref 70–99)
HCT: 33 % — ABNORMAL LOW (ref 39.0–52.0)
Hemoglobin: 11.2 g/dL — ABNORMAL LOW (ref 13.0–17.0)
Potassium: 4 mmol/L (ref 3.5–5.1)
Sodium: 136 mmol/L (ref 135–145)
TCO2: 25 mmol/L (ref 22–32)

## 2022-10-08 SURGERY — ARTERIOVENOUS (AV) FISTULA CREATION
Anesthesia: Choice | Laterality: Left

## 2022-10-08 MED ORDER — CHLORHEXIDINE GLUCONATE 0.12 % MT SOLN: 15.0000 mL | Freq: Once | OROMUCOSAL | Status: AC

## 2022-10-08 MED ORDER — CHLORHEXIDINE GLUCONATE 0.12 % MT SOLN
OROMUCOSAL | Status: AC
  Administered 2022-10-08: 15 mL via OROMUCOSAL
  Filled 2022-10-08: qty 15

## 2022-10-08 MED ORDER — SODIUM CHLORIDE 0.9 % IV SOLN: INTRAVENOUS | Status: DC

## 2022-10-08 MED ORDER — CHLORHEXIDINE GLUCONATE 4 % EX LIQD: 60.0000 mL | Freq: Once | CUTANEOUS | Status: DC

## 2022-10-08 MED ORDER — CEFAZOLIN IN SODIUM CHLORIDE 3-0.9 GM/100ML-% IV SOLN: 3.0000 g | INTRAVENOUS | Status: DC

## 2022-10-08 MED ORDER — ORAL CARE MOUTH RINSE: 15.0000 mL | Freq: Once | OROMUCOSAL | Status: AC

## 2022-10-08 MED ORDER — CEFAZOLIN IN SODIUM CHLORIDE 3-0.9 GM/100ML-% IV SOLN
INTRAVENOUS | Status: AC
  Filled 2022-10-08: qty 100

## 2022-10-08 NOTE — Progress Notes (Signed)
Anesthesia Note: I reviewed Mr. Bosshardt' chart and evaluated him in preop. Given his non-compliance with medical management, he is not a candidate for elective surgery at this time. In addition to that, his multiple and severe medical problems place him at extremely high risk for perioperative complication and mortality. I discussed this with Dr. Trula Slade and the patient in detail. Mr. Spilde should be evaluated by both cardiology and his PCP, and have a time of compliance with his medical management prior to returning for surgery. It is my firm belief that there should also be a multidisciplinary discussion regarding his long term goals and plans.

## 2022-10-08 NOTE — Progress Notes (Signed)
Surgery cancelled. Patients IV removed and discharged home with relative.

## 2022-10-09 DIAGNOSIS — N186 End stage renal disease: Secondary | ICD-10-CM | POA: Diagnosis not present

## 2022-10-09 DIAGNOSIS — N2581 Secondary hyperparathyroidism of renal origin: Secondary | ICD-10-CM | POA: Diagnosis not present

## 2022-10-09 DIAGNOSIS — I132 Hypertensive heart and chronic kidney disease with heart failure and with stage 5 chronic kidney disease, or end stage renal disease: Secondary | ICD-10-CM | POA: Diagnosis not present

## 2022-10-09 DIAGNOSIS — Z992 Dependence on renal dialysis: Secondary | ICD-10-CM | POA: Diagnosis not present

## 2022-10-09 DIAGNOSIS — E1122 Type 2 diabetes mellitus with diabetic chronic kidney disease: Secondary | ICD-10-CM | POA: Diagnosis not present

## 2022-10-09 DIAGNOSIS — T8249XA Other complication of vascular dialysis catheter, initial encounter: Secondary | ICD-10-CM | POA: Diagnosis not present

## 2022-10-09 DIAGNOSIS — Z87891 Personal history of nicotine dependence: Secondary | ICD-10-CM | POA: Diagnosis not present

## 2022-10-09 DIAGNOSIS — I5020 Unspecified systolic (congestive) heart failure: Secondary | ICD-10-CM | POA: Diagnosis not present

## 2022-10-09 DIAGNOSIS — E8770 Fluid overload, unspecified: Secondary | ICD-10-CM | POA: Diagnosis not present

## 2022-10-11 NOTE — Progress Notes (Signed)
PATIENT NAME: Phillip Young DOB: 03/14/1986 MRN: [id number]  PRIMARY CARE PROVIDER: [name], MD  RESPONSIBLE PARTY:  Acct ID - Guarantor Home Phone Work Phone Relationship Acct Type  [id number] - [name],[phone]  Self P/F     [address], [place], [place] [address]   Palliative Care Telephone Follow up Encounter Note   I connected with  Phillip Young on 10/08/22 by telephone and verified that I am speaking with the correct person using two identifiers.   Marland Kitchen  Spoke to patient who reported that he was unsure about staying on the Palliative Care program because he was being accused of "giving up and not wanting to live". The patient was very expressive about his views regarding the accusation. He also reports that because he is on Palliative Care he cannot get the surgery he was supposed to have to start dialysis. LPN explained that being on Palliative Care services does not exclude a patient from seeking treatments of any kind. LPN asked pt how he felt physically and he states "I'm doing ok but I really needed that surgery". Pt became more excited as the conversation continued so LPN educated pt on benefits of the Palliative Care program. LPN is unsure of the pt's level of understanding since there was no acknowledgement of agreement of understanding. LPN suggested a follow-up phone call in two (2) weeks and the pt agreed stating, "That's fine because I won't be so angry by that time."   CODE STATUS: Full Code ADVANCED DIRECTIVES: N MOST FORM: N    PHYSICAL EXAM:   VITALS:There were no vitals filed for this visit.        Nailea Whitehorn Georgann Housekeeper, LPN

## 2022-10-12 ENCOUNTER — Other Ambulatory Visit: Payer: Self-pay | Admitting: Student

## 2022-10-13 ENCOUNTER — Other Ambulatory Visit: Payer: Self-pay | Admitting: Student

## 2022-10-13 DIAGNOSIS — E8770 Fluid overload, unspecified: Secondary | ICD-10-CM | POA: Diagnosis not present

## 2022-10-13 DIAGNOSIS — N2581 Secondary hyperparathyroidism of renal origin: Secondary | ICD-10-CM | POA: Diagnosis not present

## 2022-10-13 DIAGNOSIS — Z87891 Personal history of nicotine dependence: Secondary | ICD-10-CM | POA: Diagnosis not present

## 2022-10-13 DIAGNOSIS — T8249XA Other complication of vascular dialysis catheter, initial encounter: Secondary | ICD-10-CM | POA: Diagnosis not present

## 2022-10-13 DIAGNOSIS — I132 Hypertensive heart and chronic kidney disease with heart failure and with stage 5 chronic kidney disease, or end stage renal disease: Secondary | ICD-10-CM | POA: Diagnosis not present

## 2022-10-13 DIAGNOSIS — E1122 Type 2 diabetes mellitus with diabetic chronic kidney disease: Secondary | ICD-10-CM | POA: Diagnosis not present

## 2022-10-13 DIAGNOSIS — I5020 Unspecified systolic (congestive) heart failure: Secondary | ICD-10-CM | POA: Diagnosis not present

## 2022-10-13 DIAGNOSIS — T7840XA Allergy, unspecified, initial encounter: Secondary | ICD-10-CM | POA: Diagnosis not present

## 2022-10-13 DIAGNOSIS — N186 End stage renal disease: Secondary | ICD-10-CM | POA: Diagnosis not present

## 2022-10-13 DIAGNOSIS — Z992 Dependence on renal dialysis: Secondary | ICD-10-CM | POA: Diagnosis not present

## 2022-10-14 DIAGNOSIS — T7840XA Allergy, unspecified, initial encounter: Secondary | ICD-10-CM | POA: Diagnosis not present

## 2022-10-14 DIAGNOSIS — J9610 Chronic respiratory failure, unspecified whether with hypoxia or hypercapnia: Secondary | ICD-10-CM | POA: Diagnosis not present

## 2022-10-14 DIAGNOSIS — N2581 Secondary hyperparathyroidism of renal origin: Secondary | ICD-10-CM | POA: Diagnosis not present

## 2022-10-14 DIAGNOSIS — I132 Hypertensive heart and chronic kidney disease with heart failure and with stage 5 chronic kidney disease, or end stage renal disease: Secondary | ICD-10-CM | POA: Diagnosis not present

## 2022-10-14 DIAGNOSIS — Z87891 Personal history of nicotine dependence: Secondary | ICD-10-CM | POA: Diagnosis not present

## 2022-10-14 DIAGNOSIS — N186 End stage renal disease: Secondary | ICD-10-CM | POA: Diagnosis not present

## 2022-10-14 DIAGNOSIS — I5020 Unspecified systolic (congestive) heart failure: Secondary | ICD-10-CM | POA: Diagnosis not present

## 2022-10-14 DIAGNOSIS — I5023 Acute on chronic systolic (congestive) heart failure: Secondary | ICD-10-CM | POA: Diagnosis not present

## 2022-10-14 DIAGNOSIS — I509 Heart failure, unspecified: Secondary | ICD-10-CM | POA: Diagnosis not present

## 2022-10-14 DIAGNOSIS — E1122 Type 2 diabetes mellitus with diabetic chronic kidney disease: Secondary | ICD-10-CM | POA: Diagnosis not present

## 2022-10-14 DIAGNOSIS — T8249XA Other complication of vascular dialysis catheter, initial encounter: Secondary | ICD-10-CM | POA: Diagnosis not present

## 2022-10-14 DIAGNOSIS — E8770 Fluid overload, unspecified: Secondary | ICD-10-CM | POA: Diagnosis not present

## 2022-10-14 DIAGNOSIS — Z992 Dependence on renal dialysis: Secondary | ICD-10-CM | POA: Diagnosis not present

## 2022-10-16 DIAGNOSIS — Z87891 Personal history of nicotine dependence: Secondary | ICD-10-CM | POA: Diagnosis not present

## 2022-10-16 DIAGNOSIS — I132 Hypertensive heart and chronic kidney disease with heart failure and with stage 5 chronic kidney disease, or end stage renal disease: Secondary | ICD-10-CM | POA: Diagnosis not present

## 2022-10-16 DIAGNOSIS — T8249XA Other complication of vascular dialysis catheter, initial encounter: Secondary | ICD-10-CM | POA: Diagnosis not present

## 2022-10-16 DIAGNOSIS — N2581 Secondary hyperparathyroidism of renal origin: Secondary | ICD-10-CM | POA: Diagnosis not present

## 2022-10-16 DIAGNOSIS — Z992 Dependence on renal dialysis: Secondary | ICD-10-CM | POA: Diagnosis not present

## 2022-10-16 DIAGNOSIS — T7840XA Allergy, unspecified, initial encounter: Secondary | ICD-10-CM | POA: Diagnosis not present

## 2022-10-16 DIAGNOSIS — N186 End stage renal disease: Secondary | ICD-10-CM | POA: Diagnosis not present

## 2022-10-16 DIAGNOSIS — I5020 Unspecified systolic (congestive) heart failure: Secondary | ICD-10-CM | POA: Diagnosis not present

## 2022-10-16 DIAGNOSIS — E1122 Type 2 diabetes mellitus with diabetic chronic kidney disease: Secondary | ICD-10-CM | POA: Diagnosis not present

## 2022-10-16 DIAGNOSIS — E8770 Fluid overload, unspecified: Secondary | ICD-10-CM | POA: Diagnosis not present

## 2022-10-17 DIAGNOSIS — I5023 Acute on chronic systolic (congestive) heart failure: Secondary | ICD-10-CM | POA: Diagnosis not present

## 2022-10-17 DIAGNOSIS — I5082 Biventricular heart failure: Secondary | ICD-10-CM | POA: Diagnosis not present

## 2022-10-20 DIAGNOSIS — Z992 Dependence on renal dialysis: Secondary | ICD-10-CM | POA: Diagnosis not present

## 2022-10-20 DIAGNOSIS — I5020 Unspecified systolic (congestive) heart failure: Secondary | ICD-10-CM | POA: Diagnosis not present

## 2022-10-20 DIAGNOSIS — T8249XA Other complication of vascular dialysis catheter, initial encounter: Secondary | ICD-10-CM | POA: Diagnosis not present

## 2022-10-20 DIAGNOSIS — N186 End stage renal disease: Secondary | ICD-10-CM | POA: Diagnosis not present

## 2022-10-20 DIAGNOSIS — N2581 Secondary hyperparathyroidism of renal origin: Secondary | ICD-10-CM | POA: Diagnosis not present

## 2022-10-20 DIAGNOSIS — I132 Hypertensive heart and chronic kidney disease with heart failure and with stage 5 chronic kidney disease, or end stage renal disease: Secondary | ICD-10-CM | POA: Diagnosis not present

## 2022-10-20 DIAGNOSIS — E1122 Type 2 diabetes mellitus with diabetic chronic kidney disease: Secondary | ICD-10-CM | POA: Diagnosis not present

## 2022-10-20 DIAGNOSIS — E8770 Fluid overload, unspecified: Secondary | ICD-10-CM | POA: Diagnosis not present

## 2022-10-20 DIAGNOSIS — Z87891 Personal history of nicotine dependence: Secondary | ICD-10-CM | POA: Diagnosis not present

## 2022-10-21 DIAGNOSIS — Z992 Dependence on renal dialysis: Secondary | ICD-10-CM | POA: Diagnosis not present

## 2022-10-21 DIAGNOSIS — E1122 Type 2 diabetes mellitus with diabetic chronic kidney disease: Secondary | ICD-10-CM | POA: Diagnosis not present

## 2022-10-21 DIAGNOSIS — I5020 Unspecified systolic (congestive) heart failure: Secondary | ICD-10-CM | POA: Diagnosis not present

## 2022-10-21 DIAGNOSIS — N2581 Secondary hyperparathyroidism of renal origin: Secondary | ICD-10-CM | POA: Diagnosis not present

## 2022-10-21 DIAGNOSIS — N186 End stage renal disease: Secondary | ICD-10-CM | POA: Diagnosis not present

## 2022-10-21 DIAGNOSIS — I132 Hypertensive heart and chronic kidney disease with heart failure and with stage 5 chronic kidney disease, or end stage renal disease: Secondary | ICD-10-CM | POA: Diagnosis not present

## 2022-10-21 DIAGNOSIS — Z87891 Personal history of nicotine dependence: Secondary | ICD-10-CM | POA: Diagnosis not present

## 2022-10-21 DIAGNOSIS — E8770 Fluid overload, unspecified: Secondary | ICD-10-CM | POA: Diagnosis not present

## 2022-10-21 DIAGNOSIS — T8249XA Other complication of vascular dialysis catheter, initial encounter: Secondary | ICD-10-CM | POA: Diagnosis not present

## 2022-10-23 DIAGNOSIS — E8770 Fluid overload, unspecified: Secondary | ICD-10-CM | POA: Diagnosis not present

## 2022-10-23 DIAGNOSIS — I132 Hypertensive heart and chronic kidney disease with heart failure and with stage 5 chronic kidney disease, or end stage renal disease: Secondary | ICD-10-CM | POA: Diagnosis not present

## 2022-10-23 DIAGNOSIS — I5020 Unspecified systolic (congestive) heart failure: Secondary | ICD-10-CM | POA: Diagnosis not present

## 2022-10-23 DIAGNOSIS — E1122 Type 2 diabetes mellitus with diabetic chronic kidney disease: Secondary | ICD-10-CM | POA: Diagnosis not present

## 2022-10-23 DIAGNOSIS — Z87891 Personal history of nicotine dependence: Secondary | ICD-10-CM | POA: Diagnosis not present

## 2022-10-23 DIAGNOSIS — N186 End stage renal disease: Secondary | ICD-10-CM | POA: Diagnosis not present

## 2022-10-23 DIAGNOSIS — T8249XA Other complication of vascular dialysis catheter, initial encounter: Secondary | ICD-10-CM | POA: Diagnosis not present

## 2022-10-23 DIAGNOSIS — Z992 Dependence on renal dialysis: Secondary | ICD-10-CM | POA: Diagnosis not present

## 2022-10-23 DIAGNOSIS — N2581 Secondary hyperparathyroidism of renal origin: Secondary | ICD-10-CM | POA: Diagnosis not present

## 2022-10-24 DIAGNOSIS — N186 End stage renal disease: Secondary | ICD-10-CM | POA: Diagnosis not present

## 2022-10-24 DIAGNOSIS — Z992 Dependence on renal dialysis: Secondary | ICD-10-CM | POA: Diagnosis not present

## 2022-10-24 DIAGNOSIS — E1122 Type 2 diabetes mellitus with diabetic chronic kidney disease: Secondary | ICD-10-CM | POA: Diagnosis not present

## 2022-10-25 DIAGNOSIS — I5082 Biventricular heart failure: Secondary | ICD-10-CM | POA: Diagnosis not present

## 2022-10-25 DIAGNOSIS — I5023 Acute on chronic systolic (congestive) heart failure: Secondary | ICD-10-CM | POA: Diagnosis not present

## 2022-10-26 DIAGNOSIS — I132 Hypertensive heart and chronic kidney disease with heart failure and with stage 5 chronic kidney disease, or end stage renal disease: Secondary | ICD-10-CM | POA: Diagnosis not present

## 2022-10-26 DIAGNOSIS — N186 End stage renal disease: Secondary | ICD-10-CM | POA: Diagnosis not present

## 2022-10-26 DIAGNOSIS — E1122 Type 2 diabetes mellitus with diabetic chronic kidney disease: Secondary | ICD-10-CM | POA: Diagnosis not present

## 2022-10-26 DIAGNOSIS — Z992 Dependence on renal dialysis: Secondary | ICD-10-CM | POA: Diagnosis not present

## 2022-10-26 DIAGNOSIS — Z87891 Personal history of nicotine dependence: Secondary | ICD-10-CM | POA: Diagnosis not present

## 2022-10-26 DIAGNOSIS — E8770 Fluid overload, unspecified: Secondary | ICD-10-CM | POA: Diagnosis not present

## 2022-10-26 DIAGNOSIS — I5020 Unspecified systolic (congestive) heart failure: Secondary | ICD-10-CM | POA: Diagnosis not present

## 2022-10-26 DIAGNOSIS — N2581 Secondary hyperparathyroidism of renal origin: Secondary | ICD-10-CM | POA: Diagnosis not present

## 2022-10-26 DIAGNOSIS — T8249XA Other complication of vascular dialysis catheter, initial encounter: Secondary | ICD-10-CM | POA: Diagnosis not present

## 2022-10-27 DIAGNOSIS — E8770 Fluid overload, unspecified: Secondary | ICD-10-CM | POA: Diagnosis not present

## 2022-10-27 DIAGNOSIS — I5020 Unspecified systolic (congestive) heart failure: Secondary | ICD-10-CM | POA: Diagnosis not present

## 2022-10-27 DIAGNOSIS — Z992 Dependence on renal dialysis: Secondary | ICD-10-CM | POA: Diagnosis not present

## 2022-10-27 DIAGNOSIS — N2581 Secondary hyperparathyroidism of renal origin: Secondary | ICD-10-CM | POA: Diagnosis not present

## 2022-10-27 DIAGNOSIS — N186 End stage renal disease: Secondary | ICD-10-CM | POA: Diagnosis not present

## 2022-10-27 DIAGNOSIS — E1122 Type 2 diabetes mellitus with diabetic chronic kidney disease: Secondary | ICD-10-CM | POA: Diagnosis not present

## 2022-10-27 DIAGNOSIS — Z87891 Personal history of nicotine dependence: Secondary | ICD-10-CM | POA: Diagnosis not present

## 2022-10-27 DIAGNOSIS — I132 Hypertensive heart and chronic kidney disease with heart failure and with stage 5 chronic kidney disease, or end stage renal disease: Secondary | ICD-10-CM | POA: Diagnosis not present

## 2022-10-27 DIAGNOSIS — T8249XA Other complication of vascular dialysis catheter, initial encounter: Secondary | ICD-10-CM | POA: Diagnosis not present

## 2022-10-28 DIAGNOSIS — Z87891 Personal history of nicotine dependence: Secondary | ICD-10-CM | POA: Diagnosis not present

## 2022-10-28 DIAGNOSIS — Z992 Dependence on renal dialysis: Secondary | ICD-10-CM | POA: Diagnosis not present

## 2022-10-28 DIAGNOSIS — N186 End stage renal disease: Secondary | ICD-10-CM | POA: Diagnosis not present

## 2022-10-28 DIAGNOSIS — E8770 Fluid overload, unspecified: Secondary | ICD-10-CM | POA: Diagnosis not present

## 2022-10-28 DIAGNOSIS — I5020 Unspecified systolic (congestive) heart failure: Secondary | ICD-10-CM | POA: Diagnosis not present

## 2022-10-28 DIAGNOSIS — I132 Hypertensive heart and chronic kidney disease with heart failure and with stage 5 chronic kidney disease, or end stage renal disease: Secondary | ICD-10-CM | POA: Diagnosis not present

## 2022-10-28 DIAGNOSIS — E1122 Type 2 diabetes mellitus with diabetic chronic kidney disease: Secondary | ICD-10-CM | POA: Diagnosis not present

## 2022-10-28 DIAGNOSIS — T8249XA Other complication of vascular dialysis catheter, initial encounter: Secondary | ICD-10-CM | POA: Diagnosis not present

## 2022-10-28 DIAGNOSIS — N2581 Secondary hyperparathyroidism of renal origin: Secondary | ICD-10-CM | POA: Diagnosis not present

## 2022-10-30 DIAGNOSIS — Z87891 Personal history of nicotine dependence: Secondary | ICD-10-CM | POA: Diagnosis not present

## 2022-10-30 DIAGNOSIS — T8249XA Other complication of vascular dialysis catheter, initial encounter: Secondary | ICD-10-CM | POA: Diagnosis not present

## 2022-10-30 DIAGNOSIS — N186 End stage renal disease: Secondary | ICD-10-CM | POA: Diagnosis not present

## 2022-10-30 DIAGNOSIS — E1122 Type 2 diabetes mellitus with diabetic chronic kidney disease: Secondary | ICD-10-CM | POA: Diagnosis not present

## 2022-10-30 DIAGNOSIS — E8770 Fluid overload, unspecified: Secondary | ICD-10-CM | POA: Diagnosis not present

## 2022-10-30 DIAGNOSIS — I132 Hypertensive heart and chronic kidney disease with heart failure and with stage 5 chronic kidney disease, or end stage renal disease: Secondary | ICD-10-CM | POA: Diagnosis not present

## 2022-10-30 DIAGNOSIS — Z992 Dependence on renal dialysis: Secondary | ICD-10-CM | POA: Diagnosis not present

## 2022-10-30 DIAGNOSIS — N2581 Secondary hyperparathyroidism of renal origin: Secondary | ICD-10-CM | POA: Diagnosis not present

## 2022-10-30 DIAGNOSIS — I5020 Unspecified systolic (congestive) heart failure: Secondary | ICD-10-CM | POA: Diagnosis not present

## 2022-11-02 ENCOUNTER — Ambulatory Visit: Payer: Self-pay

## 2022-11-02 NOTE — Patient Outreach (Unsigned)
  Care Coordination   Follow Up Visit Note   11/02/2022 Name: Kalib Starkovich MRN: [id number] DOB: 1986-03-20  Deidrick Fortes is a 37 y.o. year old male who sees Belva Agee, MD for primary care. I spoke with  [name] by phone today.  What matters to the patients health and wellness today?  ***    Goals Addressed   None     SDOH assessments and interventions completed:  {yes/no:20286}{THN Tip this will not be part of the note when signed-REQUIRED REPORT FIELD DO NOT DELETE (Optional):27901}     Care Coordination Interventions:  {INTERVENTIONS:27767} {THN Tip this will not be part of the note when signed-REQUIRED REPORT FIELD DO NOT DELETE (Optional):27901}  Follow up plan: {CCFOLLOWUP:27768}   Encounter Outcome:  {ENCOUTCOME:27770} {THN Tip this will not be part of the note when signed-REQUIRED REPORT FIELD DO NOT DELETE (Optional):27901}

## 2022-11-03 DIAGNOSIS — N2581 Secondary hyperparathyroidism of renal origin: Secondary | ICD-10-CM | POA: Diagnosis not present

## 2022-11-03 DIAGNOSIS — Z992 Dependence on renal dialysis: Secondary | ICD-10-CM | POA: Diagnosis not present

## 2022-11-03 DIAGNOSIS — N186 End stage renal disease: Secondary | ICD-10-CM | POA: Diagnosis not present

## 2022-11-03 DIAGNOSIS — I5020 Unspecified systolic (congestive) heart failure: Secondary | ICD-10-CM | POA: Diagnosis not present

## 2022-11-03 DIAGNOSIS — E8770 Fluid overload, unspecified: Secondary | ICD-10-CM | POA: Diagnosis not present

## 2022-11-03 DIAGNOSIS — T8249XA Other complication of vascular dialysis catheter, initial encounter: Secondary | ICD-10-CM | POA: Diagnosis not present

## 2022-11-03 DIAGNOSIS — E1122 Type 2 diabetes mellitus with diabetic chronic kidney disease: Secondary | ICD-10-CM | POA: Diagnosis not present

## 2022-11-03 DIAGNOSIS — I132 Hypertensive heart and chronic kidney disease with heart failure and with stage 5 chronic kidney disease, or end stage renal disease: Secondary | ICD-10-CM | POA: Diagnosis not present

## 2022-11-03 DIAGNOSIS — Z87891 Personal history of nicotine dependence: Secondary | ICD-10-CM | POA: Diagnosis not present

## 2022-11-03 NOTE — Patient Outreach (Deleted)
  Care Coordination   Follow Up Visit Note   11/03/2022 Name: Phillip Young MRN: [id number] DOB: [birth year]  Phillip Young is a 37 y.o. year old male who sees Phillip Agee, MD for primary care. I spoke with  Phillip Young by phone today.  What matters to the patients health and wellness today?  Phillip Young reported that he is feeling better now. His leg edema has reduced since he stopped taking prednisone. He uses oxygen only when he is doing any physical activity, while he doesn't need it when he is sitting idle. He is trying to walk more, taking his medications as prescribed by the doctor, and keeping a check on his fluid intake.    Goals Addressed             This Visit's Progress    I want help to deal with my daily pain and fatigue.       Patient Goals/Self Care Activities: -Patient/Caregiver will self-administer medications as prescribed as evidenced by self-report/primary caregiver report  -Patient/Caregiver will attend all scheduled provider appointments as evidenced by clinician review of documented attendance to scheduled appointments and patient/caregiver report -Patient/Caregiver will call pharmacy for medication refills as evidenced by patient report and review of pharmacy fill history as appropriate -Patient/Caregiver will call provider office for new concerns or questions as evidenced by review of documented incoming telephone call notes and patient report -Patient/Caregiver verbalizes understanding of plan -Patient/Caregiver will focus on medication adherence by taking medications as prescribed  -Keep your airway clear from mucus build up  -Self assess breathing and make appointment with provider if you have beenany breathing issues -Utilize infection prevention strategies to reduce risk of respiratory infection   patient will demonstrate use of different relaxation  skills and/or diversional activities to assist with pain reduction (distraction, imagery,  relaxation, massage, acupressure, TENS, heat, and cold application. and patient will verbalize acceptable level of pain relief and ability to engage in desired activities  -The patient will continue to follow his fluid restriction         SDOH assessments and interventions completed:  No{THN Tip this will not be part of the note when signed-REQUIRED REPORT FIELD DO NOT DELETE (Optional):27901}     Care Coordination Interventions:  Yes, provided {THN Tip this will not be part of the note when signed-REQUIRED REPORT FIELD DO NOT DELETE (Optional):27901}  Interventions Today    Flowsheet Row Most Recent Value  Chronic Disease   Chronic disease during today's visit Chronic Kidney Disease/End Stage Renal Disease (ESRD)  General Interventions   General Interventions Discussed/Reviewed General Interventions Reviewed       Follow up plan: Follow up call scheduled for 12/03/22  130 pm    Encounter Outcome:  Pt. Visit Completed {THN Tip this will not be part of the note when signed-REQUIRED REPORT FIELD DO NOT DELETE (Optional):27901}  Phillip Fairly RN, BSN, [facility] Care Coordinator [facility]   Phone: [phone]

## 2022-11-03 NOTE — Patient Instructions (Signed)
Visit Information  Thank you for taking time to visit with me today. Please don't hesitate to contact me if I can be of assistance to you.   Following are the goals we discussed today:   Goals Addressed             This Visit's Progress    I want help to deal with my daily pain and fatigue.       Patient Goals/Self Care Activities: -Patient/Caregiver will self-administer medications as prescribed as evidenced by self-report/primary caregiver report  -Patient/Caregiver will attend all scheduled provider appointments as evidenced by clinician review of documented attendance to scheduled appointments and patient/caregiver report -Patient/Caregiver will call pharmacy for medication refills as evidenced by patient report and review of pharmacy fill history as appropriate -Patient/Caregiver will call provider office for new concerns or questions as evidenced by review of documented incoming telephone call notes and patient report -Patient/Caregiver verbalizes understanding of plan -Patient/Caregiver will focus on medication adherence by taking medications as prescribed  -Keep your airway clear from mucus build up  -Self assess breathing and make appointment with provider if you have beenany breathing issues -Utilize infection prevention strategies to reduce risk of respiratory infection   patient will demonstrate use of different relaxation  skills and/or diversional activities to assist with pain reduction (distraction, imagery, relaxation, massage, acupressure, TENS, heat, and cold application. and patient will verbalize acceptable level of pain relief and ability to engage in desired activities  -The patient will continue to follow his fluid restriction         Our next appointment is by telephone on 12/03/22 at 130 pm  Please call the care guide team at [phone] if you need to cancel or reschedule your appointment.   If you are experiencing a Mental Health or Behavioral Health  Crisis or need someone to talk to, please call 1-800-273-TALK (toll free, 24 hour hotline)  Patient verbalizes understanding of instructions and care plan provided today and agrees to view in MyChart. Active MyChart status and patient understanding of how to access instructions and care plan via MyChart confirmed with patient.     Phillip Fairly RN, BSN, [facility] Care Coordinator [facility]   Phone: [phone]

## 2022-11-04 DIAGNOSIS — I5020 Unspecified systolic (congestive) heart failure: Secondary | ICD-10-CM | POA: Diagnosis not present

## 2022-11-04 DIAGNOSIS — N2581 Secondary hyperparathyroidism of renal origin: Secondary | ICD-10-CM | POA: Diagnosis not present

## 2022-11-04 DIAGNOSIS — N186 End stage renal disease: Secondary | ICD-10-CM | POA: Diagnosis not present

## 2022-11-04 DIAGNOSIS — E8770 Fluid overload, unspecified: Secondary | ICD-10-CM | POA: Diagnosis not present

## 2022-11-04 DIAGNOSIS — I132 Hypertensive heart and chronic kidney disease with heart failure and with stage 5 chronic kidney disease, or end stage renal disease: Secondary | ICD-10-CM | POA: Diagnosis not present

## 2022-11-04 DIAGNOSIS — T8249XA Other complication of vascular dialysis catheter, initial encounter: Secondary | ICD-10-CM | POA: Diagnosis not present

## 2022-11-04 DIAGNOSIS — E1122 Type 2 diabetes mellitus with diabetic chronic kidney disease: Secondary | ICD-10-CM | POA: Diagnosis not present

## 2022-11-04 DIAGNOSIS — Z992 Dependence on renal dialysis: Secondary | ICD-10-CM | POA: Diagnosis not present

## 2022-11-04 DIAGNOSIS — Z87891 Personal history of nicotine dependence: Secondary | ICD-10-CM | POA: Diagnosis not present

## 2022-11-05 ENCOUNTER — Ambulatory Visit (INDEPENDENT_AMBULATORY_CARE_PROVIDER_SITE_OTHER): Payer: BC Managed Care – PPO | Admitting: Student

## 2022-11-05 ENCOUNTER — Encounter: Payer: Self-pay | Admitting: Student

## 2022-11-05 VITALS — BP 132/82 | HR 90 | Temp 98.4°F | Wt >= 6400 oz

## 2022-11-05 DIAGNOSIS — E1122 Type 2 diabetes mellitus with diabetic chronic kidney disease: Secondary | ICD-10-CM

## 2022-11-05 DIAGNOSIS — Z992 Dependence on renal dialysis: Secondary | ICD-10-CM | POA: Diagnosis not present

## 2022-11-05 DIAGNOSIS — I5022 Chronic systolic (congestive) heart failure: Secondary | ICD-10-CM

## 2022-11-05 DIAGNOSIS — N186 End stage renal disease: Secondary | ICD-10-CM

## 2022-11-05 DIAGNOSIS — R7303 Prediabetes: Secondary | ICD-10-CM

## 2022-11-05 LAB — POCT GLYCOSYLATED HEMOGLOBIN (HGB A1C): Hemoglobin A1C: 5.6 % (ref 4.0–5.6)

## 2022-11-05 LAB — GLUCOSE, CAPILLARY: Glucose-Capillary: 104 mg/dL — ABNORMAL HIGH (ref 70–99)

## 2022-11-05 NOTE — Patient Instructions (Addendum)
Thank you, [name] for allowing [place] to provide your care today. Today we discussed your respiratory symptoms, heart failure and end-stage renal disease.  I am glad that you are feeling better.  I have ordered an echocardiogram to reevaluate your heart function.  Please follow-up with cardiology so they can refer you to cardiac rehab.  I have ordered the following tests: Echocardiogram  My Chart Access: [email or web address]  Please follow-up in 3 months or as needed  Please make sure to arrive 15 minutes prior to your next appointment. If you arrive late, you may be asked to reschedule.    We look forward to seeing you next time. Please call our clinic at [phone] if you have any questions or concerns. The best time to call is Monday-Friday from 9am-4pm, but there is someone available 24/7. If after hours or the weekend, call the main hospital number and ask for the Internal Medicine Resident On-Call. If you need medication refills, please notify your pharmacy one week in advance and they will send [place] a request.   Thank you for letting us take part in your care. Wishing you the best!  Steffanie Rainwater, MD 11/05/2022, 11:17 AM IM Resident, PGY-3 Duwayne Heck 41:10

## 2022-11-05 NOTE — Progress Notes (Unsigned)
CC: Follow up   HPI:  [name] is a 37 y.o. male with PMH as below who present to clinic to follow up on his chronic medical problems. Please see problem based charting for evaluation, assessment and plan.  Past Medical History:  Diagnosis Date   Acute on chronic respiratory failure with hypoxia 04/21/2021   Acute on chronic systolic (congestive) heart failure 02/26/2020   Biventricular congestive heart failure    Last Echo 11/2019 at [facility] reveals EF 20%   Class 3 severe obesity due to excess calories with serious comorbidity and body mass index (BMI) of 50.0 to 59.9 in adult 02/26/2020   Essential hypertension 02/26/2020   GERD without esophagitis 02/26/2020   Hidradenitis suppurativa 02/26/2020   OSA (obstructive sleep apnea) 02/26/2020   Pneumonia    Pre-diabetes    Prediabetes 02/26/2020   Renal disorder     Review of Systems:  Constitutional: Positive for chronic fatigue Eyes: Negative for visual changes Respiratory: Positive for chronic dyspnea on exertion.  Cardiac: Negative for chest pain MSK: Negative for back pain Abdomen: Negative for abdominal pain, constipation or diarrhea Neuro: Negative for headache or weakness  Physical Exam: General: Pleasant, morbidly obese man. No acute distress. Neck: Supple. Thick neck.  Cardiac: RRR. No murmurs, rubs or gallops. 1+ BLE edema Respiratory: On 3L [place]. Normal effort. Decreased breath sounds at the bases. No wheezing or rales.  Abdominal: Abdominal obesity. Non-tender. Normal BS. Skin: Warm, dry and intact without rashes or lesions Extremities: Atraumatic. Full ROM. Palpable radial and DP pulses. Neuro: A&O x 3. Moves all extremities. Normal sensation to gross touch.  Psych: Appropriate mood and affect.  Vitals:   11/05/22 1057  BP: 132/82  Pulse: 90  Temp: 98.4 F (36.9 C)  TempSrc: Oral  SpO2: 99%  Weight: (!) 431 lb 9.6 oz (195.8 kg)    Assessment & Plan:   Chronic systolic CHF (congestive  heart failure) ([name]) Patient presenting for follow up today. He was evaluated at 6 weeks ago for concerns about a covid exposure and upper respiratory symptoms. He states he feels much better from respiratory standpoint. 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 He knows that being on oxygen is a barrier to this goal but states he is down to 2-3 LNC at home and he only needs it when moving around. He is interested in know where his heart function is since he had his ablation for the aflutter. He is also interested in doing cardiac rehab. I advised him to call cardiology for a follow up appointment. I will order a TTE now so it is hopefully completed before his cardiology appointment.   Plan: -Follow up with cardiology -Repeat Echocardiogram -Continue Torsemide 100 mg daily -Continue fluid removal via HD 4x/week.    ESRD (end stage renal disease) on dialysis [facility]) States he has been going to his dialysis sessions. He goes Tuesdays, Wednesdays, Thursdays and Saturdays. States he completes about 3 hrs of each session. He is only on 2-3 LNC when moving around.   Plan: -Continue HD Tues, Wed, Thurs, Sat -Continue Torsemide 100 mg daily on non-HD days  Type 2 diabetes mellitus with diabetic chronic kidney disease A1c today is 5.6%. His last A1c in that was in diabetes range was about 2 years ago at 6.5%.  -Continue lifestyle modifications    See Encounters Tab for problem based charting.  Patient discussed with Dr. Oswaldo Done  Phillip Dibbles, MD, MPH

## 2022-11-06 DIAGNOSIS — I509 Heart failure, unspecified: Secondary | ICD-10-CM | POA: Diagnosis not present

## 2022-11-06 DIAGNOSIS — N2581 Secondary hyperparathyroidism of renal origin: Secondary | ICD-10-CM | POA: Diagnosis not present

## 2022-11-06 DIAGNOSIS — E1122 Type 2 diabetes mellitus with diabetic chronic kidney disease: Secondary | ICD-10-CM | POA: Diagnosis not present

## 2022-11-06 DIAGNOSIS — J9610 Chronic respiratory failure, unspecified whether with hypoxia or hypercapnia: Secondary | ICD-10-CM | POA: Diagnosis not present

## 2022-11-06 DIAGNOSIS — E662 Morbid (severe) obesity with alveolar hypoventilation: Secondary | ICD-10-CM | POA: Diagnosis not present

## 2022-11-06 DIAGNOSIS — T8249XA Other complication of vascular dialysis catheter, initial encounter: Secondary | ICD-10-CM | POA: Diagnosis not present

## 2022-11-06 DIAGNOSIS — E8770 Fluid overload, unspecified: Secondary | ICD-10-CM | POA: Diagnosis not present

## 2022-11-06 DIAGNOSIS — I5020 Unspecified systolic (congestive) heart failure: Secondary | ICD-10-CM | POA: Diagnosis not present

## 2022-11-06 DIAGNOSIS — N186 End stage renal disease: Secondary | ICD-10-CM | POA: Diagnosis not present

## 2022-11-06 DIAGNOSIS — Z87891 Personal history of nicotine dependence: Secondary | ICD-10-CM | POA: Diagnosis not present

## 2022-11-06 DIAGNOSIS — I5023 Acute on chronic systolic (congestive) heart failure: Secondary | ICD-10-CM | POA: Diagnosis not present

## 2022-11-06 DIAGNOSIS — Z992 Dependence on renal dialysis: Secondary | ICD-10-CM | POA: Diagnosis not present

## 2022-11-06 DIAGNOSIS — I132 Hypertensive heart and chronic kidney disease with heart failure and with stage 5 chronic kidney disease, or end stage renal disease: Secondary | ICD-10-CM | POA: Diagnosis not present

## 2022-11-07 NOTE — Progress Notes (Incomplete)
   CC: Follow up   HPI:  [name] is a 37 y.o. male with PMH as below who present to clinic to follow up on his chronic medical problems. Please see problem based charting for evaluation, assessment and plan.  Past Medical History:  Diagnosis Date  . Acute on chronic respiratory failure with hypoxia 04/21/2021  . Acute on chronic systolic (congestive) heart failure 02/26/2020  . Biventricular congestive heart failure    Last Echo 11/2019 at [facility] reveals EF 20%  . Class 3 severe obesity due to excess calories with serious comorbidity and body mass index (BMI) of 50.0 to 59.9 in adult 02/26/2020  . Essential hypertension 02/26/2020  . GERD without esophagitis 02/26/2020  . Hidradenitis suppurativa 02/26/2020  . OSA (obstructive sleep apnea) 02/26/2020  . Pneumonia   . Pre-diabetes   . Prediabetes 02/26/2020  . Renal disorder     Review of Systems:  Constitutional: Positive for chronic fatigue Eyes: Negative for visual changes Respiratory: Positive for chronic dyspnea on exertion.  Cardiac: Negative for chest pain MSK: Negative for back pain Abdomen: Negative for abdominal pain, constipation or diarrhea Neuro: Negative for headache or weakness  Physical Exam: General: Pleasant, morbidly obese man. No acute distress. Neck: Supple. Thick neck.  Cardiac: RRR. No murmurs, rubs or gallops. 1+ BLE edema Respiratory: On 3L . Normal effort. Decreased breath sounds at the bases. No wheezing or rales.  Abdominal: Abdominal obesity. Non-tender. Normal BS. Skin: Warm, dry and intact without rashes or lesions Extremities: Atraumatic. Full ROM. Palpable radial and DP pulses. Neuro: A&O x 3. Moves all extremities. Normal sensation to gross touch.  Psych: Appropriate mood and affect.  Vitals:   11/05/22 1057  BP: 132/82  Pulse: 90  Temp: 98.4 F (36.9 C)  TempSrc: Oral  SpO2: 99%  Weight: (!) 431 lb 9.6 oz (195.8 kg)    Assessment & Plan:   No problem-specific  Assessment & Plan notes found for this encounter.    See Encounters Tab for problem based charting.  Patient discussed with Dr. Anthoney Harada, MD, MPH

## 2022-11-08 ENCOUNTER — Encounter: Payer: Self-pay | Admitting: Student

## 2022-11-08 ENCOUNTER — Telehealth: Payer: Self-pay

## 2022-11-08 NOTE — Progress Notes (Signed)
Internal Medicine Clinic Attending  Case discussed with Dr. Amponsah  At the time of the visit.  We reviewed the resident's history and exam and pertinent patient test results.  I agree with the assessment, diagnosis, and plan of care documented in the resident's note.  

## 2022-11-08 NOTE — Assessment & Plan Note (Signed)
Patient presenting for follow up today. He was evaluated at 6 weeks ago for concerns about a covid exposure and upper respiratory symptoms. He states he feels much better from respiratory standpoint. 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 He knows that being on oxygen is a barrier to this goal but states he is down to 2-3 LNC at home and he only needs it when moving around. He is interested in know where his heart function is since he had his ablation for the aflutter. He is also interested in doing cardiac rehab. I advised him to call cardiology for a follow up appointment. I will order a TTE now so it is hopefully completed before his cardiology appointment.   Plan: -Follow up with cardiology -Repeat Echocardiogram -Continue Torsemide 100 mg daily -Continue fluid removal via HD 4x/week.

## 2022-11-08 NOTE — Assessment & Plan Note (Signed)
States he has been going to his dialysis sessions. He goes Tuesdays, Wednesdays, Thursdays and Saturdays. States he completes about 3 hrs of each session. He is only on 2-3 LNC when moving around.   Plan: -Continue HD Tues, Wed, Thurs, Sat -Continue Torsemide 100 mg daily on non-HD days

## 2022-11-08 NOTE — Assessment & Plan Note (Signed)
A1c today is 5.6%. His last A1c in that was in diabetes range was about 2 years ago at 6.5%.  -Continue lifestyle modifications

## 2022-11-08 NOTE — Telephone Encounter (Signed)
Pt had an appt to f/u on chronic conditions last week. Per note, pt is to have an Echo and see cardiology. We can look to r/s for surgery once this has been completed. I do not see this scheduled. I called pt and he stated they are going to call him to set this up. I encouraged him to call them if he had not heard by the end of this week. Pt verbalized understanding.

## 2022-11-09 DIAGNOSIS — I5020 Unspecified systolic (congestive) heart failure: Secondary | ICD-10-CM | POA: Diagnosis not present

## 2022-11-09 DIAGNOSIS — I132 Hypertensive heart and chronic kidney disease with heart failure and with stage 5 chronic kidney disease, or end stage renal disease: Secondary | ICD-10-CM | POA: Diagnosis not present

## 2022-11-09 DIAGNOSIS — E8770 Fluid overload, unspecified: Secondary | ICD-10-CM | POA: Diagnosis not present

## 2022-11-09 DIAGNOSIS — Z992 Dependence on renal dialysis: Secondary | ICD-10-CM | POA: Diagnosis not present

## 2022-11-09 DIAGNOSIS — E1122 Type 2 diabetes mellitus with diabetic chronic kidney disease: Secondary | ICD-10-CM | POA: Diagnosis not present

## 2022-11-09 DIAGNOSIS — N186 End stage renal disease: Secondary | ICD-10-CM | POA: Diagnosis not present

## 2022-11-09 DIAGNOSIS — Z87891 Personal history of nicotine dependence: Secondary | ICD-10-CM | POA: Diagnosis not present

## 2022-11-09 DIAGNOSIS — T8249XA Other complication of vascular dialysis catheter, initial encounter: Secondary | ICD-10-CM | POA: Diagnosis not present

## 2022-11-09 DIAGNOSIS — N2581 Secondary hyperparathyroidism of renal origin: Secondary | ICD-10-CM | POA: Diagnosis not present

## 2022-11-10 DIAGNOSIS — Z87891 Personal history of nicotine dependence: Secondary | ICD-10-CM | POA: Diagnosis not present

## 2022-11-10 DIAGNOSIS — I5020 Unspecified systolic (congestive) heart failure: Secondary | ICD-10-CM | POA: Diagnosis not present

## 2022-11-10 DIAGNOSIS — E1122 Type 2 diabetes mellitus with diabetic chronic kidney disease: Secondary | ICD-10-CM | POA: Diagnosis not present

## 2022-11-10 DIAGNOSIS — I132 Hypertensive heart and chronic kidney disease with heart failure and with stage 5 chronic kidney disease, or end stage renal disease: Secondary | ICD-10-CM | POA: Diagnosis not present

## 2022-11-10 DIAGNOSIS — Z992 Dependence on renal dialysis: Secondary | ICD-10-CM | POA: Diagnosis not present

## 2022-11-10 DIAGNOSIS — N2581 Secondary hyperparathyroidism of renal origin: Secondary | ICD-10-CM | POA: Diagnosis not present

## 2022-11-10 DIAGNOSIS — T8249XA Other complication of vascular dialysis catheter, initial encounter: Secondary | ICD-10-CM | POA: Diagnosis not present

## 2022-11-10 DIAGNOSIS — N186 End stage renal disease: Secondary | ICD-10-CM | POA: Diagnosis not present

## 2022-11-10 DIAGNOSIS — E8770 Fluid overload, unspecified: Secondary | ICD-10-CM | POA: Diagnosis not present

## 2022-11-11 DIAGNOSIS — N2581 Secondary hyperparathyroidism of renal origin: Secondary | ICD-10-CM | POA: Diagnosis not present

## 2022-11-11 DIAGNOSIS — E1122 Type 2 diabetes mellitus with diabetic chronic kidney disease: Secondary | ICD-10-CM | POA: Diagnosis not present

## 2022-11-11 DIAGNOSIS — I132 Hypertensive heart and chronic kidney disease with heart failure and with stage 5 chronic kidney disease, or end stage renal disease: Secondary | ICD-10-CM | POA: Diagnosis not present

## 2022-11-11 DIAGNOSIS — Z87891 Personal history of nicotine dependence: Secondary | ICD-10-CM | POA: Diagnosis not present

## 2022-11-11 DIAGNOSIS — E8770 Fluid overload, unspecified: Secondary | ICD-10-CM | POA: Diagnosis not present

## 2022-11-11 DIAGNOSIS — Z992 Dependence on renal dialysis: Secondary | ICD-10-CM | POA: Diagnosis not present

## 2022-11-11 DIAGNOSIS — I5020 Unspecified systolic (congestive) heart failure: Secondary | ICD-10-CM | POA: Diagnosis not present

## 2022-11-11 DIAGNOSIS — T8249XA Other complication of vascular dialysis catheter, initial encounter: Secondary | ICD-10-CM | POA: Diagnosis not present

## 2022-11-11 DIAGNOSIS — N186 End stage renal disease: Secondary | ICD-10-CM | POA: Diagnosis not present

## 2022-11-13 DIAGNOSIS — I132 Hypertensive heart and chronic kidney disease with heart failure and with stage 5 chronic kidney disease, or end stage renal disease: Secondary | ICD-10-CM | POA: Diagnosis not present

## 2022-11-13 DIAGNOSIS — I5020 Unspecified systolic (congestive) heart failure: Secondary | ICD-10-CM | POA: Diagnosis not present

## 2022-11-13 DIAGNOSIS — E1122 Type 2 diabetes mellitus with diabetic chronic kidney disease: Secondary | ICD-10-CM | POA: Diagnosis not present

## 2022-11-13 DIAGNOSIS — Z992 Dependence on renal dialysis: Secondary | ICD-10-CM | POA: Diagnosis not present

## 2022-11-13 DIAGNOSIS — E8770 Fluid overload, unspecified: Secondary | ICD-10-CM | POA: Diagnosis not present

## 2022-11-13 DIAGNOSIS — T8249XA Other complication of vascular dialysis catheter, initial encounter: Secondary | ICD-10-CM | POA: Diagnosis not present

## 2022-11-13 DIAGNOSIS — Z87891 Personal history of nicotine dependence: Secondary | ICD-10-CM | POA: Diagnosis not present

## 2022-11-13 DIAGNOSIS — N2581 Secondary hyperparathyroidism of renal origin: Secondary | ICD-10-CM | POA: Diagnosis not present

## 2022-11-13 DIAGNOSIS — N186 End stage renal disease: Secondary | ICD-10-CM | POA: Diagnosis not present

## 2022-11-14 DIAGNOSIS — J9610 Chronic respiratory failure, unspecified whether with hypoxia or hypercapnia: Secondary | ICD-10-CM | POA: Diagnosis not present

## 2022-11-14 DIAGNOSIS — I5023 Acute on chronic systolic (congestive) heart failure: Secondary | ICD-10-CM | POA: Diagnosis not present

## 2022-11-14 DIAGNOSIS — I509 Heart failure, unspecified: Secondary | ICD-10-CM | POA: Diagnosis not present

## 2022-11-16 NOTE — Progress Notes (Deleted)
HPI M former smoker, bus driver, followed for OSA, complicated by CHF, AFlutter, Chronic Hypoxic Respiratory Failure, GERD, Hidradenitis Suppurativa, Osteomyelitis, ESRD/Dialysis, Obesity,  NPSG 09/10/21 AHI 110.4/ hr, desaturation to 71%, Mean 88%,  Trilogy NIV  VTE 437 ml,, Avg IPAP 21.3, Avg EPAP 11.2/ [employer]    Download compliance - Used 83% of days in past month, average 7.4 hrs/ night  O2 1-2L/sleep [employer]   Walk Test 05/18/22 O2 qualifying-started at 97% RA/ 129 HR. After 1 lap he dropped to 86%/ 132. Remained 86% walking on 3L, required 5L continuous O2 walking for Sat 96%, HR 130.  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 He is aware his heart is out of rhythm again, pending cardiology visit in 2 days.  He anticipates that he may recommend cardioversion again. I suggested we do an oxygen qualifying walk test while he is here so that we have the data, even though an order for portable oxygen might depend on what Dr. Elberta Fortis decides to do. Walk Test 05/18/22 O2 qualifying-started at 97% RA/ 129 HR. After 1 lap he dropped to 86%/ 132. Remained 86% walking on 3L, required 5L continuous O2 walking for Sat 96%, HR 130. CXR 03/26/22 IMPRESSION: 1. Cardiomegaly with pulmonary vascular congestion. 2. Mild airspace disease at the lung bases, possible edema or infiltrate.  11/18/22- 36  yoM former smoker, bus driver, followed for OSA, complicated by CHF, AFlutter, Chronic Hypoxic Respiratory Failure, GERD, Hidradenitis Suppurativa, Osteomyelitis, ESRD/Dialysis, Obesity,  Trilogy NIV  VTE 437 ml,, Avg IPAP 21.3, Avg EPAP 11.2/ [employer]    -Breo 100,  Download compliance -   O2 1-2L/sleep [employer]      Body weight today-   CXR 08/05/22- MPRESSION: Cardiomegaly and central pulmonary vascular congestion.     ROS-see HPI   + = positive Constitutional:    weight loss, night sweats, fevers, chills, fatigue, lassitude. HEENT:    headaches, difficulty swallowing, tooth/dental problems, sore throat,       sneezing, itching, ear ache, nasal congestion, post nasal drip, snoring CV:    chest pain, orthopnea, PND, swelling in lower extremities, anasarca,         dizziness, palpitations Resp:  + shortness of breath with exertion or at rest.                +productive cough,   non-productive cough, coughing up of blood.              change in color of mucus.  wheezing.   Skin:    rash or lesions. GI:  No-   heartburn, indigestion, abdominal pain, nausea, vomiting, diarrhea,                 change in bowel habits, loss of appetite GU: dysuria, change in color of urine, no urgency or frequency.   flank pain. MS:   joint pain, stiffness, decreased range of motion, back pain. Neuro-     nothing  unusual Psych:  change in mood or affect.  depression or anxiety.   memory loss.  OBJ- Physical Exam General- Alert, Oriented, Affect-appropriate, Distress- none acute, +morbidly obese Skin- rash-none, lesions- none, excoriation- none Lymphadenopathy- none Head- atraumatic            Eyes- Gross vision intact, PERRLA, conjunctivae and secretions clear            Ears- Hearing, canals-normal            Nose- Clear, no-Septal dev, mucus, polyps, erosion, perforation             Throat- Mallampati III-IV, mucosa clear , drainage- none, tonsils- atrophic Neck- flexible , trachea midline, no stridor  , thyroid nl, carotid no bruit Chest - symmetrical excursion , unlabored           Heart/CV- +IRR/ barely audible , no murmur , no gallop  , no rub, nl s1 s2                           - JVD- none , edema- none, stasis changes- none, varices- none           Lung- clear to P&A, wheeze- none, cough- none , dullness-none, rub- none           Chest wall- +HD access R upper chest Abd-  Br/ Gen/ Rectal- Not done, not indicated Extrem- cyanosis- none, clubbing, none, atrophy- none, strength- nl Neuro- grossly intact to observation

## 2022-11-17 DIAGNOSIS — I5020 Unspecified systolic (congestive) heart failure: Secondary | ICD-10-CM | POA: Diagnosis not present

## 2022-11-17 DIAGNOSIS — I132 Hypertensive heart and chronic kidney disease with heart failure and with stage 5 chronic kidney disease, or end stage renal disease: Secondary | ICD-10-CM | POA: Diagnosis not present

## 2022-11-17 DIAGNOSIS — I5082 Biventricular heart failure: Secondary | ICD-10-CM | POA: Diagnosis not present

## 2022-11-17 DIAGNOSIS — E8770 Fluid overload, unspecified: Secondary | ICD-10-CM | POA: Diagnosis not present

## 2022-11-17 DIAGNOSIS — T8249XA Other complication of vascular dialysis catheter, initial encounter: Secondary | ICD-10-CM | POA: Diagnosis not present

## 2022-11-17 DIAGNOSIS — Z87891 Personal history of nicotine dependence: Secondary | ICD-10-CM | POA: Diagnosis not present

## 2022-11-17 DIAGNOSIS — Z992 Dependence on renal dialysis: Secondary | ICD-10-CM | POA: Diagnosis not present

## 2022-11-17 DIAGNOSIS — N186 End stage renal disease: Secondary | ICD-10-CM | POA: Diagnosis not present

## 2022-11-17 DIAGNOSIS — E1122 Type 2 diabetes mellitus with diabetic chronic kidney disease: Secondary | ICD-10-CM | POA: Diagnosis not present

## 2022-11-17 DIAGNOSIS — N2581 Secondary hyperparathyroidism of renal origin: Secondary | ICD-10-CM | POA: Diagnosis not present

## 2022-11-17 DIAGNOSIS — I5023 Acute on chronic systolic (congestive) heart failure: Secondary | ICD-10-CM | POA: Diagnosis not present

## 2022-11-18 ENCOUNTER — Ambulatory Visit: Payer: BC Managed Care – PPO | Admitting: Internal Medicine

## 2022-11-18 DIAGNOSIS — N186 End stage renal disease: Secondary | ICD-10-CM | POA: Diagnosis not present

## 2022-11-18 DIAGNOSIS — I5020 Unspecified systolic (congestive) heart failure: Secondary | ICD-10-CM | POA: Diagnosis not present

## 2022-11-18 DIAGNOSIS — E1122 Type 2 diabetes mellitus with diabetic chronic kidney disease: Secondary | ICD-10-CM | POA: Diagnosis not present

## 2022-11-18 DIAGNOSIS — E8770 Fluid overload, unspecified: Secondary | ICD-10-CM | POA: Diagnosis not present

## 2022-11-18 DIAGNOSIS — I132 Hypertensive heart and chronic kidney disease with heart failure and with stage 5 chronic kidney disease, or end stage renal disease: Secondary | ICD-10-CM | POA: Diagnosis not present

## 2022-11-18 DIAGNOSIS — Z992 Dependence on renal dialysis: Secondary | ICD-10-CM | POA: Diagnosis not present

## 2022-11-18 DIAGNOSIS — N2581 Secondary hyperparathyroidism of renal origin: Secondary | ICD-10-CM | POA: Diagnosis not present

## 2022-11-18 DIAGNOSIS — Z87891 Personal history of nicotine dependence: Secondary | ICD-10-CM | POA: Diagnosis not present

## 2022-11-18 DIAGNOSIS — T8249XA Other complication of vascular dialysis catheter, initial encounter: Secondary | ICD-10-CM | POA: Diagnosis not present

## 2022-11-18 NOTE — Telephone Encounter (Signed)
Attempted to reach patient to follow up on status of cardiology appt and echo, that is needed prior to rescheduling surgery. Left vm for patient to return call.

## 2022-11-22 DIAGNOSIS — I132 Hypertensive heart and chronic kidney disease with heart failure and with stage 5 chronic kidney disease, or end stage renal disease: Secondary | ICD-10-CM | POA: Diagnosis not present

## 2022-11-22 DIAGNOSIS — Z87891 Personal history of nicotine dependence: Secondary | ICD-10-CM | POA: Diagnosis not present

## 2022-11-22 DIAGNOSIS — N186 End stage renal disease: Secondary | ICD-10-CM | POA: Diagnosis not present

## 2022-11-22 DIAGNOSIS — E8770 Fluid overload, unspecified: Secondary | ICD-10-CM | POA: Diagnosis not present

## 2022-11-22 DIAGNOSIS — I5020 Unspecified systolic (congestive) heart failure: Secondary | ICD-10-CM | POA: Diagnosis not present

## 2022-11-22 DIAGNOSIS — Z992 Dependence on renal dialysis: Secondary | ICD-10-CM | POA: Diagnosis not present

## 2022-11-22 DIAGNOSIS — N2581 Secondary hyperparathyroidism of renal origin: Secondary | ICD-10-CM | POA: Diagnosis not present

## 2022-11-22 DIAGNOSIS — E1122 Type 2 diabetes mellitus with diabetic chronic kidney disease: Secondary | ICD-10-CM | POA: Diagnosis not present

## 2022-11-23 DIAGNOSIS — N186 End stage renal disease: Secondary | ICD-10-CM | POA: Diagnosis not present

## 2022-11-23 DIAGNOSIS — E1122 Type 2 diabetes mellitus with diabetic chronic kidney disease: Secondary | ICD-10-CM | POA: Diagnosis not present

## 2022-11-23 DIAGNOSIS — Z992 Dependence on renal dialysis: Secondary | ICD-10-CM | POA: Diagnosis not present

## 2022-11-23 DIAGNOSIS — E8770 Fluid overload, unspecified: Secondary | ICD-10-CM | POA: Diagnosis not present

## 2022-11-23 DIAGNOSIS — Z87891 Personal history of nicotine dependence: Secondary | ICD-10-CM | POA: Diagnosis not present

## 2022-11-23 DIAGNOSIS — I132 Hypertensive heart and chronic kidney disease with heart failure and with stage 5 chronic kidney disease, or end stage renal disease: Secondary | ICD-10-CM | POA: Diagnosis not present

## 2022-11-23 DIAGNOSIS — I5020 Unspecified systolic (congestive) heart failure: Secondary | ICD-10-CM | POA: Diagnosis not present

## 2022-11-23 DIAGNOSIS — N2581 Secondary hyperparathyroidism of renal origin: Secondary | ICD-10-CM | POA: Diagnosis not present

## 2022-11-24 DIAGNOSIS — I5082 Biventricular heart failure: Secondary | ICD-10-CM | POA: Diagnosis not present

## 2022-11-24 DIAGNOSIS — I5023 Acute on chronic systolic (congestive) heart failure: Secondary | ICD-10-CM | POA: Diagnosis not present

## 2022-11-25 ENCOUNTER — Encounter: Payer: BC Managed Care – PPO | Admitting: Student

## 2022-11-25 DIAGNOSIS — Z992 Dependence on renal dialysis: Secondary | ICD-10-CM | POA: Diagnosis not present

## 2022-11-25 DIAGNOSIS — I5020 Unspecified systolic (congestive) heart failure: Secondary | ICD-10-CM | POA: Diagnosis not present

## 2022-11-25 DIAGNOSIS — Z87891 Personal history of nicotine dependence: Secondary | ICD-10-CM | POA: Diagnosis not present

## 2022-11-25 DIAGNOSIS — I132 Hypertensive heart and chronic kidney disease with heart failure and with stage 5 chronic kidney disease, or end stage renal disease: Secondary | ICD-10-CM | POA: Diagnosis not present

## 2022-11-25 DIAGNOSIS — E8770 Fluid overload, unspecified: Secondary | ICD-10-CM | POA: Diagnosis not present

## 2022-11-25 DIAGNOSIS — E1122 Type 2 diabetes mellitus with diabetic chronic kidney disease: Secondary | ICD-10-CM | POA: Diagnosis not present

## 2022-11-25 DIAGNOSIS — N186 End stage renal disease: Secondary | ICD-10-CM | POA: Diagnosis not present

## 2022-11-25 DIAGNOSIS — N2581 Secondary hyperparathyroidism of renal origin: Secondary | ICD-10-CM | POA: Diagnosis not present

## 2022-11-30 DIAGNOSIS — E8770 Fluid overload, unspecified: Secondary | ICD-10-CM | POA: Diagnosis not present

## 2022-11-30 DIAGNOSIS — I5020 Unspecified systolic (congestive) heart failure: Secondary | ICD-10-CM | POA: Diagnosis not present

## 2022-11-30 DIAGNOSIS — Z992 Dependence on renal dialysis: Secondary | ICD-10-CM | POA: Diagnosis not present

## 2022-11-30 DIAGNOSIS — E1122 Type 2 diabetes mellitus with diabetic chronic kidney disease: Secondary | ICD-10-CM | POA: Diagnosis not present

## 2022-11-30 DIAGNOSIS — I132 Hypertensive heart and chronic kidney disease with heart failure and with stage 5 chronic kidney disease, or end stage renal disease: Secondary | ICD-10-CM | POA: Diagnosis not present

## 2022-11-30 DIAGNOSIS — Z87891 Personal history of nicotine dependence: Secondary | ICD-10-CM | POA: Diagnosis not present

## 2022-11-30 DIAGNOSIS — N186 End stage renal disease: Secondary | ICD-10-CM | POA: Diagnosis not present

## 2022-11-30 DIAGNOSIS — N2581 Secondary hyperparathyroidism of renal origin: Secondary | ICD-10-CM | POA: Diagnosis not present

## 2022-12-02 DIAGNOSIS — E8770 Fluid overload, unspecified: Secondary | ICD-10-CM | POA: Diagnosis not present

## 2022-12-02 DIAGNOSIS — I132 Hypertensive heart and chronic kidney disease with heart failure and with stage 5 chronic kidney disease, or end stage renal disease: Secondary | ICD-10-CM | POA: Diagnosis not present

## 2022-12-02 DIAGNOSIS — N186 End stage renal disease: Secondary | ICD-10-CM | POA: Diagnosis not present

## 2022-12-02 DIAGNOSIS — E1122 Type 2 diabetes mellitus with diabetic chronic kidney disease: Secondary | ICD-10-CM | POA: Diagnosis not present

## 2022-12-02 DIAGNOSIS — I5020 Unspecified systolic (congestive) heart failure: Secondary | ICD-10-CM | POA: Diagnosis not present

## 2022-12-02 DIAGNOSIS — Z87891 Personal history of nicotine dependence: Secondary | ICD-10-CM | POA: Diagnosis not present

## 2022-12-02 DIAGNOSIS — Z992 Dependence on renal dialysis: Secondary | ICD-10-CM | POA: Diagnosis not present

## 2022-12-02 DIAGNOSIS — N2581 Secondary hyperparathyroidism of renal origin: Secondary | ICD-10-CM | POA: Diagnosis not present

## 2022-12-03 ENCOUNTER — Ambulatory Visit: Payer: Self-pay

## 2022-12-03 NOTE — Patient Instructions (Signed)
Visit Information  Thank you for taking time to visit with me today. Please don't hesitate to contact me if I can be of assistance to you.   Following are the goals we discussed today:   Goals Addressed             This Visit's Progress    I want help to deal with my daily pain and fatigue.       Patient Goals/Self Care Activities: -Patient/Caregiver will self-administer medications as prescribed as evidenced by self-report/primary caregiver report  -Patient/Caregiver will attend all scheduled provider appointments as evidenced by clinician review of documented attendance to scheduled appointments and patient/caregiver report -Patient/Caregiver will call pharmacy for medication refills as evidenced by patient report and review of pharmacy fill history as appropriate -Patient/Caregiver will call provider office for new concerns or questions as evidenced by review of documented incoming telephone call notes and patient report -Patient/Caregiver verbalizes understanding of plan -Patient/Caregiver will focus on medication adherence by taking medications as prescribed  -Keep your airway clear from mucus build up  -Self assess breathing and make appointment with provider if you have beenany breathing issues -Utilize infection prevention strategies to reduce risk of respiratory infection   patient will demonstrate use of different relaxation  skills and/or diversional activities to assist with pain reduction (distraction, imagery, relaxation, massage, acupressure, TENS, heat, and cold application. and patient will verbalize acceptable level of pain relief and ability to engage in desired activities  -The patient will continue to follow his fluid restriction -continue current regiment         Our next appointment is by telephone on 01/07/23 at 2 pm  Please call the care guide team at [phone] if you need to cancel or reschedule your appointment.   If you are experiencing a Mental  Health or Behavioral Health Crisis or need someone to talk to, please call 1-800-273-TALK (toll free, 24 hour hotline)  Patient verbalizes understanding of instructions and care plan provided today and agrees to view in MyChart. Active MyChart status and patient understanding of how to access instructions and care plan via MyChart confirmed with patient.     Phillip Fairly RN, BSN, [facility] Care Coordinator [facility]   Phone: [phone]

## 2022-12-03 NOTE — Patient Outreach (Signed)
  Care Coordination   Follow Up Visit Note   12/03/2022 Name: Phillip Young MRN: [id number] DOB: 07-Apr-1986  Phillip Young is a 37 y.o. year old male who sees Phillip Agee, MD for primary care. I spoke with  Phillip Young by phone today.  What matters to the patients health and wellness today?  Phillip Young is starting to feel better. He is eager to resume work, but he needs an echocardiogram first. The Physician will make an appointment for him. Currently, he is on oxygen and experiencing persistent coughing, which the doctors suspect is a side effect of his medication. He is actively seeking a solution to alleviate this condition. Despite his health problems, he is still going to dialysis as scheduled.    Goals Addressed             This Visit's Progress    I want help to deal with my daily pain and fatigue.       Patient Goals/Self Care Activities: -Patient/Caregiver will self-administer medications as prescribed as evidenced by self-report/primary caregiver report  -Patient/Caregiver will attend all scheduled provider appointments as evidenced by clinician review of documented attendance to scheduled appointments and patient/caregiver report -Patient/Caregiver will call pharmacy for medication refills as evidenced by patient report and review of pharmacy fill history as appropriate -Patient/Caregiver will call provider office for new concerns or questions as evidenced by review of documented incoming telephone call notes and patient report -Patient/Caregiver verbalizes understanding of plan -Patient/Caregiver will focus on medication adherence by taking medications as prescribed  -Keep your airway clear from mucus build up  -Self assess breathing and make appointment with provider if you have beenany breathing issues -Utilize infection prevention strategies to reduce risk of respiratory infection   patient will demonstrate use of different relaxation  skills and/or diversional  activities to assist with pain reduction (distraction, imagery, relaxation, massage, acupressure, TENS, heat, and cold application. and patient will verbalize acceptable level of pain relief and ability to engage in desired activities  -The patient will continue to follow his fluid restriction -continue current regiment         SDOH assessments and interventions completed:  No     Care Coordination Interventions:  No, not indicated  01/07/23 Follow up plan: Follow up call scheduled for 01/07/23  2 pm    Encounter Outcome:  Pt. Visit Completed   Phillip Fairly RN, BSN, [facility] Care Coordinator [facility]   Phone: [phone]

## 2022-12-04 DIAGNOSIS — N186 End stage renal disease: Secondary | ICD-10-CM | POA: Diagnosis not present

## 2022-12-04 DIAGNOSIS — I132 Hypertensive heart and chronic kidney disease with heart failure and with stage 5 chronic kidney disease, or end stage renal disease: Secondary | ICD-10-CM | POA: Diagnosis not present

## 2022-12-04 DIAGNOSIS — I5020 Unspecified systolic (congestive) heart failure: Secondary | ICD-10-CM | POA: Diagnosis not present

## 2022-12-04 DIAGNOSIS — E1122 Type 2 diabetes mellitus with diabetic chronic kidney disease: Secondary | ICD-10-CM | POA: Diagnosis not present

## 2022-12-04 DIAGNOSIS — N2581 Secondary hyperparathyroidism of renal origin: Secondary | ICD-10-CM | POA: Diagnosis not present

## 2022-12-04 DIAGNOSIS — E8770 Fluid overload, unspecified: Secondary | ICD-10-CM | POA: Diagnosis not present

## 2022-12-04 DIAGNOSIS — Z87891 Personal history of nicotine dependence: Secondary | ICD-10-CM | POA: Diagnosis not present

## 2022-12-04 DIAGNOSIS — Z992 Dependence on renal dialysis: Secondary | ICD-10-CM | POA: Diagnosis not present

## 2022-12-06 DIAGNOSIS — J9610 Chronic respiratory failure, unspecified whether with hypoxia or hypercapnia: Secondary | ICD-10-CM | POA: Diagnosis not present

## 2022-12-06 DIAGNOSIS — I5023 Acute on chronic systolic (congestive) heart failure: Secondary | ICD-10-CM | POA: Diagnosis not present

## 2022-12-06 DIAGNOSIS — E662 Morbid (severe) obesity with alveolar hypoventilation: Secondary | ICD-10-CM | POA: Diagnosis not present

## 2022-12-06 DIAGNOSIS — I509 Heart failure, unspecified: Secondary | ICD-10-CM | POA: Diagnosis not present

## 2022-12-09 DIAGNOSIS — I132 Hypertensive heart and chronic kidney disease with heart failure and with stage 5 chronic kidney disease, or end stage renal disease: Secondary | ICD-10-CM | POA: Diagnosis not present

## 2022-12-09 DIAGNOSIS — E1122 Type 2 diabetes mellitus with diabetic chronic kidney disease: Secondary | ICD-10-CM | POA: Diagnosis not present

## 2022-12-09 DIAGNOSIS — N2581 Secondary hyperparathyroidism of renal origin: Secondary | ICD-10-CM | POA: Diagnosis not present

## 2022-12-09 DIAGNOSIS — I5020 Unspecified systolic (congestive) heart failure: Secondary | ICD-10-CM | POA: Diagnosis not present

## 2022-12-09 DIAGNOSIS — Z992 Dependence on renal dialysis: Secondary | ICD-10-CM | POA: Diagnosis not present

## 2022-12-09 DIAGNOSIS — N186 End stage renal disease: Secondary | ICD-10-CM | POA: Diagnosis not present

## 2022-12-09 DIAGNOSIS — E8770 Fluid overload, unspecified: Secondary | ICD-10-CM | POA: Diagnosis not present

## 2022-12-09 DIAGNOSIS — Z87891 Personal history of nicotine dependence: Secondary | ICD-10-CM | POA: Diagnosis not present

## 2022-12-10 ENCOUNTER — Encounter: Payer: Self-pay | Admitting: Internal Medicine

## 2022-12-11 DIAGNOSIS — I5020 Unspecified systolic (congestive) heart failure: Secondary | ICD-10-CM | POA: Diagnosis not present

## 2022-12-11 DIAGNOSIS — Z992 Dependence on renal dialysis: Secondary | ICD-10-CM | POA: Diagnosis not present

## 2022-12-11 DIAGNOSIS — I132 Hypertensive heart and chronic kidney disease with heart failure and with stage 5 chronic kidney disease, or end stage renal disease: Secondary | ICD-10-CM | POA: Diagnosis not present

## 2022-12-11 DIAGNOSIS — N186 End stage renal disease: Secondary | ICD-10-CM | POA: Diagnosis not present

## 2022-12-11 DIAGNOSIS — E8770 Fluid overload, unspecified: Secondary | ICD-10-CM | POA: Diagnosis not present

## 2022-12-11 DIAGNOSIS — N2581 Secondary hyperparathyroidism of renal origin: Secondary | ICD-10-CM | POA: Diagnosis not present

## 2022-12-11 DIAGNOSIS — E1122 Type 2 diabetes mellitus with diabetic chronic kidney disease: Secondary | ICD-10-CM | POA: Diagnosis not present

## 2022-12-11 DIAGNOSIS — Z87891 Personal history of nicotine dependence: Secondary | ICD-10-CM | POA: Diagnosis not present

## 2022-12-14 DIAGNOSIS — I509 Heart failure, unspecified: Secondary | ICD-10-CM | POA: Diagnosis not present

## 2022-12-14 DIAGNOSIS — J9610 Chronic respiratory failure, unspecified whether with hypoxia or hypercapnia: Secondary | ICD-10-CM | POA: Diagnosis not present

## 2022-12-14 DIAGNOSIS — I5023 Acute on chronic systolic (congestive) heart failure: Secondary | ICD-10-CM | POA: Diagnosis not present

## 2022-12-15 DIAGNOSIS — I132 Hypertensive heart and chronic kidney disease with heart failure and with stage 5 chronic kidney disease, or end stage renal disease: Secondary | ICD-10-CM | POA: Diagnosis not present

## 2022-12-15 DIAGNOSIS — E8770 Fluid overload, unspecified: Secondary | ICD-10-CM | POA: Diagnosis not present

## 2022-12-15 DIAGNOSIS — I5020 Unspecified systolic (congestive) heart failure: Secondary | ICD-10-CM | POA: Diagnosis not present

## 2022-12-15 DIAGNOSIS — Z87891 Personal history of nicotine dependence: Secondary | ICD-10-CM | POA: Diagnosis not present

## 2022-12-15 DIAGNOSIS — N186 End stage renal disease: Secondary | ICD-10-CM | POA: Diagnosis not present

## 2022-12-15 DIAGNOSIS — Z992 Dependence on renal dialysis: Secondary | ICD-10-CM | POA: Diagnosis not present

## 2022-12-15 DIAGNOSIS — N2581 Secondary hyperparathyroidism of renal origin: Secondary | ICD-10-CM | POA: Diagnosis not present

## 2022-12-15 DIAGNOSIS — E1122 Type 2 diabetes mellitus with diabetic chronic kidney disease: Secondary | ICD-10-CM | POA: Diagnosis not present

## 2022-12-17 DIAGNOSIS — I5023 Acute on chronic systolic (congestive) heart failure: Secondary | ICD-10-CM | POA: Diagnosis not present

## 2022-12-17 DIAGNOSIS — I5082 Biventricular heart failure: Secondary | ICD-10-CM | POA: Diagnosis not present

## 2022-12-18 DIAGNOSIS — Z87891 Personal history of nicotine dependence: Secondary | ICD-10-CM | POA: Diagnosis not present

## 2022-12-18 DIAGNOSIS — I5020 Unspecified systolic (congestive) heart failure: Secondary | ICD-10-CM | POA: Diagnosis not present

## 2022-12-18 DIAGNOSIS — N186 End stage renal disease: Secondary | ICD-10-CM | POA: Diagnosis not present

## 2022-12-18 DIAGNOSIS — E8770 Fluid overload, unspecified: Secondary | ICD-10-CM | POA: Diagnosis not present

## 2022-12-18 DIAGNOSIS — I132 Hypertensive heart and chronic kidney disease with heart failure and with stage 5 chronic kidney disease, or end stage renal disease: Secondary | ICD-10-CM | POA: Diagnosis not present

## 2022-12-18 DIAGNOSIS — Z992 Dependence on renal dialysis: Secondary | ICD-10-CM | POA: Diagnosis not present

## 2022-12-18 DIAGNOSIS — N2581 Secondary hyperparathyroidism of renal origin: Secondary | ICD-10-CM | POA: Diagnosis not present

## 2022-12-18 DIAGNOSIS — E1122 Type 2 diabetes mellitus with diabetic chronic kidney disease: Secondary | ICD-10-CM | POA: Diagnosis not present

## 2022-12-21 DIAGNOSIS — N2581 Secondary hyperparathyroidism of renal origin: Secondary | ICD-10-CM | POA: Diagnosis not present

## 2022-12-21 DIAGNOSIS — Z87891 Personal history of nicotine dependence: Secondary | ICD-10-CM | POA: Diagnosis not present

## 2022-12-21 DIAGNOSIS — E8770 Fluid overload, unspecified: Secondary | ICD-10-CM | POA: Diagnosis not present

## 2022-12-21 DIAGNOSIS — Z992 Dependence on renal dialysis: Secondary | ICD-10-CM | POA: Diagnosis not present

## 2022-12-21 DIAGNOSIS — I5020 Unspecified systolic (congestive) heart failure: Secondary | ICD-10-CM | POA: Diagnosis not present

## 2022-12-21 DIAGNOSIS — E1122 Type 2 diabetes mellitus with diabetic chronic kidney disease: Secondary | ICD-10-CM | POA: Diagnosis not present

## 2022-12-21 DIAGNOSIS — N186 End stage renal disease: Secondary | ICD-10-CM | POA: Diagnosis not present

## 2022-12-21 DIAGNOSIS — I132 Hypertensive heart and chronic kidney disease with heart failure and with stage 5 chronic kidney disease, or end stage renal disease: Secondary | ICD-10-CM | POA: Diagnosis not present

## 2022-12-23 DIAGNOSIS — Z87891 Personal history of nicotine dependence: Secondary | ICD-10-CM | POA: Diagnosis not present

## 2022-12-23 DIAGNOSIS — E8770 Fluid overload, unspecified: Secondary | ICD-10-CM | POA: Diagnosis not present

## 2022-12-23 DIAGNOSIS — N186 End stage renal disease: Secondary | ICD-10-CM | POA: Diagnosis not present

## 2022-12-23 DIAGNOSIS — I5020 Unspecified systolic (congestive) heart failure: Secondary | ICD-10-CM | POA: Diagnosis not present

## 2022-12-23 DIAGNOSIS — N2581 Secondary hyperparathyroidism of renal origin: Secondary | ICD-10-CM | POA: Diagnosis not present

## 2022-12-23 DIAGNOSIS — Z992 Dependence on renal dialysis: Secondary | ICD-10-CM | POA: Diagnosis not present

## 2022-12-23 DIAGNOSIS — E1122 Type 2 diabetes mellitus with diabetic chronic kidney disease: Secondary | ICD-10-CM | POA: Diagnosis not present

## 2022-12-23 DIAGNOSIS — I132 Hypertensive heart and chronic kidney disease with heart failure and with stage 5 chronic kidney disease, or end stage renal disease: Secondary | ICD-10-CM | POA: Diagnosis not present

## 2022-12-24 ENCOUNTER — Ambulatory Visit (HOSPITAL_COMMUNITY)
Admission: RE | Admit: 2022-12-24 | Discharge: 2022-12-24 | Disposition: A | Discharge status: Home / Self Care | Payer: BC Managed Care – PPO | Source: Ambulatory Visit | Attending: Student in an Organized Health Care Education/Training Program | Admitting: Student in an Organized Health Care Education/Training Program

## 2022-12-24 DIAGNOSIS — E119 Type 2 diabetes mellitus without complications: Secondary | ICD-10-CM | POA: Insufficient documentation

## 2022-12-24 DIAGNOSIS — F17200 Nicotine dependence, unspecified, uncomplicated: Secondary | ICD-10-CM | POA: Insufficient documentation

## 2022-12-24 DIAGNOSIS — J449 Chronic obstructive pulmonary disease, unspecified: Secondary | ICD-10-CM | POA: Insufficient documentation

## 2022-12-24 DIAGNOSIS — I110 Hypertensive heart disease with heart failure: Secondary | ICD-10-CM | POA: Insufficient documentation

## 2022-12-24 DIAGNOSIS — I3139 Other pericardial effusion (noninflammatory): Secondary | ICD-10-CM | POA: Diagnosis not present

## 2022-12-24 DIAGNOSIS — G4730 Sleep apnea, unspecified: Secondary | ICD-10-CM | POA: Diagnosis not present

## 2022-12-24 DIAGNOSIS — N186 End stage renal disease: Secondary | ICD-10-CM | POA: Diagnosis not present

## 2022-12-24 DIAGNOSIS — Z992 Dependence on renal dialysis: Secondary | ICD-10-CM | POA: Diagnosis not present

## 2022-12-24 DIAGNOSIS — R0609 Other forms of dyspnea: Secondary | ICD-10-CM

## 2022-12-24 DIAGNOSIS — I5022 Chronic systolic (congestive) heart failure: Secondary | ICD-10-CM | POA: Diagnosis not present

## 2022-12-24 DIAGNOSIS — E1122 Type 2 diabetes mellitus with diabetic chronic kidney disease: Secondary | ICD-10-CM | POA: Diagnosis not present

## 2022-12-24 LAB — ECHOCARDIOGRAM COMPLETE
AV Mean grad: 4 mmHg
AV Peak grad: 5.8 mmHg
Ao pk vel: 1.2 m/s
Area-P 1/2: 7.02 cm2
S' Lateral: 5.3 cm

## 2022-12-24 MED ORDER — PERFLUTREN LIPID MICROSPHERE
1.0000 mL | INTRAVENOUS | Status: AC | PRN
  Administered 2022-12-24: 2 mL via INTRAVENOUS

## 2022-12-25 DIAGNOSIS — E1122 Type 2 diabetes mellitus with diabetic chronic kidney disease: Secondary | ICD-10-CM | POA: Diagnosis not present

## 2022-12-25 DIAGNOSIS — I5023 Acute on chronic systolic (congestive) heart failure: Secondary | ICD-10-CM | POA: Diagnosis not present

## 2022-12-25 DIAGNOSIS — I5020 Unspecified systolic (congestive) heart failure: Secondary | ICD-10-CM | POA: Diagnosis not present

## 2022-12-25 DIAGNOSIS — I5082 Biventricular heart failure: Secondary | ICD-10-CM | POA: Diagnosis not present

## 2022-12-25 DIAGNOSIS — Z87891 Personal history of nicotine dependence: Secondary | ICD-10-CM | POA: Diagnosis not present

## 2022-12-25 DIAGNOSIS — N2581 Secondary hyperparathyroidism of renal origin: Secondary | ICD-10-CM | POA: Diagnosis not present

## 2022-12-25 DIAGNOSIS — I132 Hypertensive heart and chronic kidney disease with heart failure and with stage 5 chronic kidney disease, or end stage renal disease: Secondary | ICD-10-CM | POA: Diagnosis not present

## 2022-12-25 DIAGNOSIS — N186 End stage renal disease: Secondary | ICD-10-CM | POA: Diagnosis not present

## 2022-12-25 DIAGNOSIS — E8770 Fluid overload, unspecified: Secondary | ICD-10-CM | POA: Diagnosis not present

## 2022-12-25 DIAGNOSIS — Z992 Dependence on renal dialysis: Secondary | ICD-10-CM | POA: Diagnosis not present

## 2022-12-27 ENCOUNTER — Encounter: Payer: Self-pay | Admitting: Student

## 2022-12-28 DIAGNOSIS — E8770 Fluid overload, unspecified: Secondary | ICD-10-CM | POA: Diagnosis not present

## 2022-12-28 DIAGNOSIS — Z992 Dependence on renal dialysis: Secondary | ICD-10-CM | POA: Diagnosis not present

## 2022-12-28 DIAGNOSIS — I5020 Unspecified systolic (congestive) heart failure: Secondary | ICD-10-CM | POA: Diagnosis not present

## 2022-12-28 DIAGNOSIS — N186 End stage renal disease: Secondary | ICD-10-CM | POA: Diagnosis not present

## 2022-12-28 DIAGNOSIS — E1122 Type 2 diabetes mellitus with diabetic chronic kidney disease: Secondary | ICD-10-CM | POA: Diagnosis not present

## 2022-12-28 DIAGNOSIS — N2581 Secondary hyperparathyroidism of renal origin: Secondary | ICD-10-CM | POA: Diagnosis not present

## 2022-12-28 DIAGNOSIS — Z87891 Personal history of nicotine dependence: Secondary | ICD-10-CM | POA: Diagnosis not present

## 2022-12-28 DIAGNOSIS — I132 Hypertensive heart and chronic kidney disease with heart failure and with stage 5 chronic kidney disease, or end stage renal disease: Secondary | ICD-10-CM | POA: Diagnosis not present

## 2022-12-29 NOTE — Progress Notes (Signed)
Repeat TTE with no significant change from prior study in August 2023. His EF is still 20-25%. Called patient to discuss the results. He is doing okay. I reminded him to follow up with cardiology since he is interested in cardiac rehab.

## 2023-01-01 DIAGNOSIS — N186 End stage renal disease: Secondary | ICD-10-CM | POA: Diagnosis not present

## 2023-01-01 DIAGNOSIS — E1122 Type 2 diabetes mellitus with diabetic chronic kidney disease: Secondary | ICD-10-CM | POA: Diagnosis not present

## 2023-01-01 DIAGNOSIS — I132 Hypertensive heart and chronic kidney disease with heart failure and with stage 5 chronic kidney disease, or end stage renal disease: Secondary | ICD-10-CM | POA: Diagnosis not present

## 2023-01-01 DIAGNOSIS — E8770 Fluid overload, unspecified: Secondary | ICD-10-CM | POA: Diagnosis not present

## 2023-01-01 DIAGNOSIS — Z992 Dependence on renal dialysis: Secondary | ICD-10-CM | POA: Diagnosis not present

## 2023-01-01 DIAGNOSIS — I5020 Unspecified systolic (congestive) heart failure: Secondary | ICD-10-CM | POA: Diagnosis not present

## 2023-01-01 DIAGNOSIS — N2581 Secondary hyperparathyroidism of renal origin: Secondary | ICD-10-CM | POA: Diagnosis not present

## 2023-01-01 DIAGNOSIS — Z87891 Personal history of nicotine dependence: Secondary | ICD-10-CM | POA: Diagnosis not present

## 2023-01-04 DIAGNOSIS — E1122 Type 2 diabetes mellitus with diabetic chronic kidney disease: Secondary | ICD-10-CM | POA: Diagnosis not present

## 2023-01-04 DIAGNOSIS — I5020 Unspecified systolic (congestive) heart failure: Secondary | ICD-10-CM | POA: Diagnosis not present

## 2023-01-04 DIAGNOSIS — E8770 Fluid overload, unspecified: Secondary | ICD-10-CM | POA: Diagnosis not present

## 2023-01-04 DIAGNOSIS — I132 Hypertensive heart and chronic kidney disease with heart failure and with stage 5 chronic kidney disease, or end stage renal disease: Secondary | ICD-10-CM | POA: Diagnosis not present

## 2023-01-04 DIAGNOSIS — N2581 Secondary hyperparathyroidism of renal origin: Secondary | ICD-10-CM | POA: Diagnosis not present

## 2023-01-04 DIAGNOSIS — N186 End stage renal disease: Secondary | ICD-10-CM | POA: Diagnosis not present

## 2023-01-04 DIAGNOSIS — Z992 Dependence on renal dialysis: Secondary | ICD-10-CM | POA: Diagnosis not present

## 2023-01-04 DIAGNOSIS — Z87891 Personal history of nicotine dependence: Secondary | ICD-10-CM | POA: Diagnosis not present

## 2023-01-06 DIAGNOSIS — J9610 Chronic respiratory failure, unspecified whether with hypoxia or hypercapnia: Secondary | ICD-10-CM | POA: Diagnosis not present

## 2023-01-06 DIAGNOSIS — E1122 Type 2 diabetes mellitus with diabetic chronic kidney disease: Secondary | ICD-10-CM | POA: Diagnosis not present

## 2023-01-06 DIAGNOSIS — I132 Hypertensive heart and chronic kidney disease with heart failure and with stage 5 chronic kidney disease, or end stage renal disease: Secondary | ICD-10-CM | POA: Diagnosis not present

## 2023-01-06 DIAGNOSIS — N2581 Secondary hyperparathyroidism of renal origin: Secondary | ICD-10-CM | POA: Diagnosis not present

## 2023-01-06 DIAGNOSIS — E8770 Fluid overload, unspecified: Secondary | ICD-10-CM | POA: Diagnosis not present

## 2023-01-06 DIAGNOSIS — I509 Heart failure, unspecified: Secondary | ICD-10-CM | POA: Diagnosis not present

## 2023-01-06 DIAGNOSIS — Z87891 Personal history of nicotine dependence: Secondary | ICD-10-CM | POA: Diagnosis not present

## 2023-01-06 DIAGNOSIS — I5023 Acute on chronic systolic (congestive) heart failure: Secondary | ICD-10-CM | POA: Diagnosis not present

## 2023-01-06 DIAGNOSIS — E662 Morbid (severe) obesity with alveolar hypoventilation: Secondary | ICD-10-CM | POA: Diagnosis not present

## 2023-01-06 DIAGNOSIS — I5020 Unspecified systolic (congestive) heart failure: Secondary | ICD-10-CM | POA: Diagnosis not present

## 2023-01-06 DIAGNOSIS — Z992 Dependence on renal dialysis: Secondary | ICD-10-CM | POA: Diagnosis not present

## 2023-01-06 DIAGNOSIS — N186 End stage renal disease: Secondary | ICD-10-CM | POA: Diagnosis not present

## 2023-01-07 ENCOUNTER — Ambulatory Visit: Payer: Self-pay

## 2023-01-07 NOTE — Patient Outreach (Signed)
  Care Coordination   Follow Up Visit Note   01/07/2023 Name: Phillip Young MRN: [id number] DOB: [birth year]  Phillip Young is a 37 y.o. year old male who sees Marrianne Mood, MD for primary care. I spoke with  Phillip Young by phone today.  What matters to the patients health and wellness today?  Phillip Young is experiencing weakened bones. He also states  that he has irregular heart rhythm while on the dialysis machine. He continues to depend on 5 liters of oxygen and has been trying to walk more, but it is painful for his bones. His blood pressure fluctuates during dialysis, and his ejection fraction is 20-25%. His medication remains unchanged, and he takes them daily.    Goals Addressed             This Visit's Progress    I want help to deal with my daily pain and fatigue.       Patient Goals/Self Care Activities: -Patient/Caregiver will self-administer medications as prescribed as evidenced by self-report/primary caregiver report  -Patient/Caregiver will attend all scheduled provider appointments as evidenced by clinician review of documented attendance to scheduled appointments and patient/caregiver report -Patient/Caregiver will call pharmacy for medication refills as evidenced by patient report and review of pharmacy fill history as appropriate -Patient/Caregiver will call provider office for new concerns or questions as evidenced by review of documented incoming telephone call notes and patient report -Patient/Caregiver verbalizes understanding of plan -Patient/Caregiver will focus on medication adherence by taking medications as prescribed  -Keep your airway clear from mucus build up  -Self assess breathing and make appointment with provider if you have beenany breathing issues -Utilize infection prevention strategies to reduce risk of respiratory infection   patient will demonstrate use of different relaxation  skills and/or diversional activities to assist with pain  reduction (distraction, imagery, relaxation, massage, acupressure, TENS, heat, and cold application. and patient will verbalize acceptable level of pain relief and ability to engage in desired activities  -The patient will continue to follow his fluid restriction -continue current regiment -continue to do light exercise         SDOH assessments and interventions completed:  No     Care Coordination Interventions:  Yes, provided    Interventions Today    Flowsheet Row Most Recent Value  Chronic Disease   Chronic disease during today's visit Chronic Kidney Disease/End Stage Renal Disease (ESRD)  General Interventions   General Interventions Discussed/Reviewed General Interventions Discussed  Exercise Interventions   Exercise Discussed/Reviewed Physical Activity  Physical Activity Discussed/Reviewed Physical Activity Discussed  Education Interventions   Education Provided Provided Education  Provided Verbal Education On Medication  Pharmacy Interventions   Pharmacy Dicussed/Reviewed Pharmacy Topics Discussed  Safety Interventions   Safety Discussed/Reviewed Safety Discussed       Follow up plan: Follow up call scheduled for 02/17/23  1 pm    Encounter Outcome:  Pt. Visit Completed   Juanell Fairly RN, BSN, [facility] Care Coordinator [facility]   Phone: [phone]

## 2023-01-07 NOTE — Patient Instructions (Signed)
Visit Information  Thank you for taking time to visit with me today. Please don't hesitate to contact me if I can be of assistance to you.   Following are the goals we discussed today:   Goals Addressed             This Visit's Progress    I want help to deal with my daily pain and fatigue.       Patient Goals/Self Care Activities: -Patient/Caregiver will self-administer medications as prescribed as evidenced by self-report/primary caregiver report  -Patient/Caregiver will attend all scheduled provider appointments as evidenced by clinician review of documented attendance to scheduled appointments and patient/caregiver report -Patient/Caregiver will call pharmacy for medication refills as evidenced by patient report and review of pharmacy fill history as appropriate -Patient/Caregiver will call provider office for new concerns or questions as evidenced by review of documented incoming telephone call notes and patient report -Patient/Caregiver verbalizes understanding of plan -Patient/Caregiver will focus on medication adherence by taking medications as prescribed  -Keep your airway clear from mucus build up  -Self assess breathing and make appointment with provider if you have beenany breathing issues -Utilize infection prevention strategies to reduce risk of respiratory infection   patient will demonstrate use of different relaxation  skills and/or diversional activities to assist with pain reduction (distraction, imagery, relaxation, massage, acupressure, TENS, heat, and cold application. and patient will verbalize acceptable level of pain relief and ability to engage in desired activities  -The patient will continue to follow his fluid restriction -continue current regiment -continue to do light exercise         Our next appointment is by telephone on 02/17/23 at 1 pm 1 pm Please call the care guide team at [phone] if you need to cancel or reschedule your appointment.   If  you are experiencing a Mental Health or Behavioral Health Crisis or need someone to talk to, please call 1-800-273-TALK (toll free, 24 hour hotline)  Patient verbalizes understanding of instructions and care plan provided today and agrees to view in MyChart. Active MyChart status and patient understanding of how to access instructions and care plan via MyChart confirmed with patient.     Juanell Fairly RN, BSN, [facility] Care Coordinator [facility]   Phone: [phone]

## 2023-01-08 DIAGNOSIS — E8770 Fluid overload, unspecified: Secondary | ICD-10-CM | POA: Diagnosis not present

## 2023-01-08 DIAGNOSIS — Z992 Dependence on renal dialysis: Secondary | ICD-10-CM | POA: Diagnosis not present

## 2023-01-08 DIAGNOSIS — E1122 Type 2 diabetes mellitus with diabetic chronic kidney disease: Secondary | ICD-10-CM | POA: Diagnosis not present

## 2023-01-08 DIAGNOSIS — N186 End stage renal disease: Secondary | ICD-10-CM | POA: Diagnosis not present

## 2023-01-08 DIAGNOSIS — Z87891 Personal history of nicotine dependence: Secondary | ICD-10-CM | POA: Diagnosis not present

## 2023-01-08 DIAGNOSIS — I5020 Unspecified systolic (congestive) heart failure: Secondary | ICD-10-CM | POA: Diagnosis not present

## 2023-01-08 DIAGNOSIS — I132 Hypertensive heart and chronic kidney disease with heart failure and with stage 5 chronic kidney disease, or end stage renal disease: Secondary | ICD-10-CM | POA: Diagnosis not present

## 2023-01-08 DIAGNOSIS — N2581 Secondary hyperparathyroidism of renal origin: Secondary | ICD-10-CM | POA: Diagnosis not present

## 2023-01-11 DIAGNOSIS — Z87891 Personal history of nicotine dependence: Secondary | ICD-10-CM | POA: Diagnosis not present

## 2023-01-11 DIAGNOSIS — N2581 Secondary hyperparathyroidism of renal origin: Secondary | ICD-10-CM | POA: Diagnosis not present

## 2023-01-11 DIAGNOSIS — N186 End stage renal disease: Secondary | ICD-10-CM | POA: Diagnosis not present

## 2023-01-11 DIAGNOSIS — I5020 Unspecified systolic (congestive) heart failure: Secondary | ICD-10-CM | POA: Diagnosis not present

## 2023-01-11 DIAGNOSIS — I132 Hypertensive heart and chronic kidney disease with heart failure and with stage 5 chronic kidney disease, or end stage renal disease: Secondary | ICD-10-CM | POA: Diagnosis not present

## 2023-01-11 DIAGNOSIS — E1122 Type 2 diabetes mellitus with diabetic chronic kidney disease: Secondary | ICD-10-CM | POA: Diagnosis not present

## 2023-01-11 DIAGNOSIS — Z992 Dependence on renal dialysis: Secondary | ICD-10-CM | POA: Diagnosis not present

## 2023-01-11 DIAGNOSIS — E8770 Fluid overload, unspecified: Secondary | ICD-10-CM | POA: Diagnosis not present

## 2023-01-13 ENCOUNTER — Telehealth: Payer: Self-pay | Admitting: Cardiology

## 2023-01-13 DIAGNOSIS — N2581 Secondary hyperparathyroidism of renal origin: Secondary | ICD-10-CM | POA: Diagnosis not present

## 2023-01-13 DIAGNOSIS — E8770 Fluid overload, unspecified: Secondary | ICD-10-CM | POA: Diagnosis not present

## 2023-01-13 DIAGNOSIS — I5020 Unspecified systolic (congestive) heart failure: Secondary | ICD-10-CM | POA: Diagnosis not present

## 2023-01-13 DIAGNOSIS — Z992 Dependence on renal dialysis: Secondary | ICD-10-CM | POA: Diagnosis not present

## 2023-01-13 DIAGNOSIS — N186 End stage renal disease: Secondary | ICD-10-CM | POA: Diagnosis not present

## 2023-01-13 DIAGNOSIS — I132 Hypertensive heart and chronic kidney disease with heart failure and with stage 5 chronic kidney disease, or end stage renal disease: Secondary | ICD-10-CM | POA: Diagnosis not present

## 2023-01-13 DIAGNOSIS — E1122 Type 2 diabetes mellitus with diabetic chronic kidney disease: Secondary | ICD-10-CM | POA: Diagnosis not present

## 2023-01-13 DIAGNOSIS — Z87891 Personal history of nicotine dependence: Secondary | ICD-10-CM | POA: Diagnosis not present

## 2023-01-13 NOTE — Telephone Encounter (Signed)
   Pre-operative Risk Assessment    Patient Name: Phillip Young  DOB: Jun 13, 1986 MRN: [id number]      Request for Surgical Clearance    Procedure:   First Stage Basilic Fistula  Date of Surgery:  Clearance TBD                                 Surgeon:  Dr. Myra Gianotti Surgeon's Group or Practice Name:  [facility] Phone number:  [phone]  Fax number:  [phone]   Type of Clearance Requested:   - Medical    Type of Anesthesia:   MAC or Peripheral Nerve Block   Additional requests/questions:   Caller stated they will need cardiac clearance.  Signed, Annetta Maw   01/13/2023, 12:53 PM

## 2023-01-13 NOTE — Telephone Encounter (Signed)
   Name: Phillip Young  DOB: 1986/03/07  MRN: [id number]  Primary Cardiologist: Marca Ancona, MD   Preoperative team, please contact this patient and set up a phone call appointment for further preoperative risk assessment. Please obtain consent and complete medication review. Thank you for your help.  I confirm that guidance regarding antiplatelet and oral anticoagulation therapy has been completed and, if necessary, noted below.   Patient's apixaban is managed by noncardiac provider.  Recommendations for holding apixaban will need to come from prescribing provider.  Ronney Asters, NP 01/13/2023, 1:33 PM [facility]

## 2023-01-13 NOTE — Telephone Encounter (Signed)
I s/w the pt about tele appt for pre op. Pt states he wants an IN OFFICE appt with Dr. Shirlee Latch or APP instead. I assured him that I will send a message to [facility] and they will call him with an appt for IN OFFICE. Pt said thank you. I will update the requesting office as FYI.

## 2023-01-14 ENCOUNTER — Telehealth (HOSPITAL_COMMUNITY): Payer: Self-pay | Admitting: Cardiology

## 2023-01-14 DIAGNOSIS — I509 Heart failure, unspecified: Secondary | ICD-10-CM | POA: Diagnosis not present

## 2023-01-14 DIAGNOSIS — I5023 Acute on chronic systolic (congestive) heart failure: Secondary | ICD-10-CM | POA: Diagnosis not present

## 2023-01-14 DIAGNOSIS — J9610 Chronic respiratory failure, unspecified whether with hypoxia or hypercapnia: Secondary | ICD-10-CM | POA: Diagnosis not present

## 2023-01-14 NOTE — Telephone Encounter (Signed)
-----   Message from Tarri Fuller, CMA sent at 01/13/2023  1:53 PM EDT ----- Regarding: In office appt for pre op clearance per pt request Hi ladies,   Se notes below. I assured trh pt that someone will call him with an appt.   Thank you  Okey Regal    I s/w the pt about tele appt for pre op. Pt states he wants an IN OFFICE appt with Dr. Shirlee Latch or APP instead. I assured him that I will send a message to [facility] and they will call him with an appt for IN OFFICE for pre op clearance. Pt said thank you. I will update the requesting office as FYI.

## 2023-01-15 DIAGNOSIS — N186 End stage renal disease: Secondary | ICD-10-CM | POA: Diagnosis not present

## 2023-01-15 DIAGNOSIS — I5020 Unspecified systolic (congestive) heart failure: Secondary | ICD-10-CM | POA: Diagnosis not present

## 2023-01-15 DIAGNOSIS — E8770 Fluid overload, unspecified: Secondary | ICD-10-CM | POA: Diagnosis not present

## 2023-01-15 DIAGNOSIS — Z992 Dependence on renal dialysis: Secondary | ICD-10-CM | POA: Diagnosis not present

## 2023-01-15 DIAGNOSIS — I132 Hypertensive heart and chronic kidney disease with heart failure and with stage 5 chronic kidney disease, or end stage renal disease: Secondary | ICD-10-CM | POA: Diagnosis not present

## 2023-01-15 DIAGNOSIS — Z87891 Personal history of nicotine dependence: Secondary | ICD-10-CM | POA: Diagnosis not present

## 2023-01-15 DIAGNOSIS — N2581 Secondary hyperparathyroidism of renal origin: Secondary | ICD-10-CM | POA: Diagnosis not present

## 2023-01-15 DIAGNOSIS — E1122 Type 2 diabetes mellitus with diabetic chronic kidney disease: Secondary | ICD-10-CM | POA: Diagnosis not present

## 2023-01-17 DIAGNOSIS — I5023 Acute on chronic systolic (congestive) heart failure: Secondary | ICD-10-CM | POA: Diagnosis not present

## 2023-01-17 DIAGNOSIS — I5082 Biventricular heart failure: Secondary | ICD-10-CM | POA: Diagnosis not present

## 2023-01-18 DIAGNOSIS — N2581 Secondary hyperparathyroidism of renal origin: Secondary | ICD-10-CM | POA: Diagnosis not present

## 2023-01-18 DIAGNOSIS — I5020 Unspecified systolic (congestive) heart failure: Secondary | ICD-10-CM | POA: Diagnosis not present

## 2023-01-18 DIAGNOSIS — Z992 Dependence on renal dialysis: Secondary | ICD-10-CM | POA: Diagnosis not present

## 2023-01-18 DIAGNOSIS — E8770 Fluid overload, unspecified: Secondary | ICD-10-CM | POA: Diagnosis not present

## 2023-01-18 DIAGNOSIS — N186 End stage renal disease: Secondary | ICD-10-CM | POA: Diagnosis not present

## 2023-01-18 DIAGNOSIS — Z87891 Personal history of nicotine dependence: Secondary | ICD-10-CM | POA: Diagnosis not present

## 2023-01-18 DIAGNOSIS — E1122 Type 2 diabetes mellitus with diabetic chronic kidney disease: Secondary | ICD-10-CM | POA: Diagnosis not present

## 2023-01-18 DIAGNOSIS — I132 Hypertensive heart and chronic kidney disease with heart failure and with stage 5 chronic kidney disease, or end stage renal disease: Secondary | ICD-10-CM | POA: Diagnosis not present

## 2023-01-21 ENCOUNTER — Encounter (HOSPITAL_COMMUNITY): Payer: Self-pay

## 2023-01-21 ENCOUNTER — Inpatient Hospital Stay (HOSPITAL_COMMUNITY)
Admission: EM | Admit: 2023-01-21 | Discharge: 2023-03-23 | Disposition: A | Discharge status: Other Acute Inpatient Hospital | Payer: BC Managed Care – PPO | Source: Home / Self Care | Attending: Internal Medicine | Admitting: Internal Medicine

## 2023-01-21 ENCOUNTER — Emergency Department (HOSPITAL_COMMUNITY): Payer: BC Managed Care – PPO

## 2023-01-21 DIAGNOSIS — Z515 Encounter for palliative care: Secondary | ICD-10-CM | POA: Diagnosis not present

## 2023-01-21 DIAGNOSIS — G5793 Unspecified mononeuropathy of bilateral lower limbs: Secondary | ICD-10-CM | POA: Diagnosis not present

## 2023-01-21 DIAGNOSIS — M791 Myalgia, unspecified site: Secondary | ICD-10-CM | POA: Diagnosis present

## 2023-01-21 DIAGNOSIS — T80219A Unspecified infection due to central venous catheter, initial encounter: Secondary | ICD-10-CM | POA: Diagnosis not present

## 2023-01-21 DIAGNOSIS — Z7901 Long term (current) use of anticoagulants: Secondary | ICD-10-CM

## 2023-01-21 DIAGNOSIS — D631 Anemia in chronic kidney disease: Secondary | ICD-10-CM | POA: Diagnosis not present

## 2023-01-21 DIAGNOSIS — R319 Hematuria, unspecified: Secondary | ICD-10-CM | POA: Diagnosis not present

## 2023-01-21 DIAGNOSIS — G629 Polyneuropathy, unspecified: Secondary | ICD-10-CM | POA: Diagnosis not present

## 2023-01-21 DIAGNOSIS — Z992 Dependence on renal dialysis: Secondary | ICD-10-CM

## 2023-01-21 DIAGNOSIS — L89156 Pressure-induced deep tissue damage of sacral region: Secondary | ICD-10-CM | POA: Diagnosis not present

## 2023-01-21 DIAGNOSIS — Z8249 Family history of ischemic heart disease and other diseases of the circulatory system: Secondary | ICD-10-CM

## 2023-01-21 DIAGNOSIS — D75829 Heparin-induced thrombocytopenia, unspecified: Secondary | ICD-10-CM | POA: Diagnosis present

## 2023-01-21 DIAGNOSIS — R7881 Bacteremia: Secondary | ICD-10-CM | POA: Diagnosis not present

## 2023-01-21 DIAGNOSIS — J9610 Chronic respiratory failure, unspecified whether with hypoxia or hypercapnia: Secondary | ICD-10-CM | POA: Diagnosis not present

## 2023-01-21 DIAGNOSIS — E872 Acidosis, unspecified: Secondary | ICD-10-CM | POA: Diagnosis not present

## 2023-01-21 DIAGNOSIS — I4819 Other persistent atrial fibrillation: Secondary | ICD-10-CM | POA: Diagnosis not present

## 2023-01-21 DIAGNOSIS — Y712 Prosthetic and other implants, materials and accessory cardiovascular devices associated with adverse incidents: Secondary | ICD-10-CM | POA: Diagnosis not present

## 2023-01-21 DIAGNOSIS — R0989 Other specified symptoms and signs involving the circulatory and respiratory systems: Secondary | ICD-10-CM | POA: Diagnosis not present

## 2023-01-21 DIAGNOSIS — N2581 Secondary hyperparathyroidism of renal origin: Secondary | ICD-10-CM | POA: Diagnosis present

## 2023-01-21 DIAGNOSIS — L89153 Pressure ulcer of sacral region, stage 3: Secondary | ICD-10-CM | POA: Diagnosis not present

## 2023-01-21 DIAGNOSIS — I480 Paroxysmal atrial fibrillation: Secondary | ICD-10-CM | POA: Diagnosis not present

## 2023-01-21 DIAGNOSIS — L732 Hidradenitis suppurativa: Secondary | ICD-10-CM | POA: Diagnosis present

## 2023-01-21 DIAGNOSIS — J811 Chronic pulmonary edema: Secondary | ICD-10-CM | POA: Diagnosis not present

## 2023-01-21 DIAGNOSIS — I472 Ventricular tachycardia, unspecified: Secondary | ICD-10-CM | POA: Diagnosis not present

## 2023-01-21 DIAGNOSIS — Y848 Other medical procedures as the cause of abnormal reaction of the patient, or of later complication, without mention of misadventure at the time of the procedure: Secondary | ICD-10-CM | POA: Diagnosis not present

## 2023-01-21 DIAGNOSIS — I5043 Acute on chronic combined systolic (congestive) and diastolic (congestive) heart failure: Secondary | ICD-10-CM | POA: Diagnosis present

## 2023-01-21 DIAGNOSIS — J9621 Acute and chronic respiratory failure with hypoxia: Secondary | ICD-10-CM | POA: Diagnosis not present

## 2023-01-21 DIAGNOSIS — M79671 Pain in right foot: Secondary | ICD-10-CM | POA: Diagnosis not present

## 2023-01-21 DIAGNOSIS — M898X9 Other specified disorders of bone, unspecified site: Secondary | ICD-10-CM | POA: Diagnosis not present

## 2023-01-21 DIAGNOSIS — R159 Full incontinence of feces: Secondary | ICD-10-CM | POA: Diagnosis not present

## 2023-01-21 DIAGNOSIS — Z87891 Personal history of nicotine dependence: Secondary | ICD-10-CM

## 2023-01-21 DIAGNOSIS — I5022 Chronic systolic (congestive) heart failure: Secondary | ICD-10-CM | POA: Diagnosis not present

## 2023-01-21 DIAGNOSIS — E861 Hypovolemia: Secondary | ICD-10-CM | POA: Diagnosis present

## 2023-01-21 DIAGNOSIS — R140 Abdominal distension (gaseous): Secondary | ICD-10-CM | POA: Diagnosis not present

## 2023-01-21 DIAGNOSIS — R0689 Other abnormalities of breathing: Secondary | ICD-10-CM | POA: Diagnosis not present

## 2023-01-21 DIAGNOSIS — E871 Hypo-osmolality and hyponatremia: Secondary | ICD-10-CM | POA: Diagnosis present

## 2023-01-21 DIAGNOSIS — T8241XA Breakdown (mechanical) of vascular dialysis catheter, initial encounter: Secondary | ICD-10-CM | POA: Diagnosis not present

## 2023-01-21 DIAGNOSIS — A419 Sepsis, unspecified organism: Secondary | ICD-10-CM | POA: Diagnosis present

## 2023-01-21 DIAGNOSIS — R6521 Severe sepsis with septic shock: Secondary | ICD-10-CM | POA: Diagnosis not present

## 2023-01-21 DIAGNOSIS — L89326 Pressure-induced deep tissue damage of left buttock: Secondary | ICD-10-CM | POA: Diagnosis present

## 2023-01-21 DIAGNOSIS — Z91199 Patient's noncompliance with other medical treatment and regimen due to unspecified reason: Secondary | ICD-10-CM

## 2023-01-21 DIAGNOSIS — K3189 Other diseases of stomach and duodenum: Secondary | ICD-10-CM | POA: Diagnosis not present

## 2023-01-21 DIAGNOSIS — R570 Cardiogenic shock: Secondary | ICD-10-CM | POA: Diagnosis not present

## 2023-01-21 DIAGNOSIS — J9622 Acute and chronic respiratory failure with hypercapnia: Secondary | ICD-10-CM | POA: Diagnosis not present

## 2023-01-21 DIAGNOSIS — R000 Tachycardia, unspecified: Secondary | ICD-10-CM | POA: Diagnosis not present

## 2023-01-21 DIAGNOSIS — I471 Supraventricular tachycardia, unspecified: Secondary | ICD-10-CM | POA: Diagnosis not present

## 2023-01-21 DIAGNOSIS — R079 Chest pain, unspecified: Secondary | ICD-10-CM | POA: Diagnosis not present

## 2023-01-21 DIAGNOSIS — Z7985 Long-term (current) use of injectable non-insulin antidiabetic drugs: Secondary | ICD-10-CM

## 2023-01-21 DIAGNOSIS — I5082 Biventricular heart failure: Secondary | ICD-10-CM | POA: Diagnosis not present

## 2023-01-21 DIAGNOSIS — R0602 Shortness of breath: Secondary | ICD-10-CM | POA: Diagnosis not present

## 2023-01-21 DIAGNOSIS — T45515A Adverse effect of anticoagulants, initial encounter: Secondary | ICD-10-CM | POA: Diagnosis present

## 2023-01-21 DIAGNOSIS — Z9981 Dependence on supplemental oxygen: Secondary | ICD-10-CM

## 2023-01-21 DIAGNOSIS — R0789 Other chest pain: Secondary | ICD-10-CM | POA: Diagnosis not present

## 2023-01-21 DIAGNOSIS — I132 Hypertensive heart and chronic kidney disease with heart failure and with stage 5 chronic kidney disease, or end stage renal disease: Secondary | ICD-10-CM | POA: Diagnosis present

## 2023-01-21 DIAGNOSIS — Z880 Allergy status to penicillin: Secondary | ICD-10-CM

## 2023-01-21 DIAGNOSIS — D72829 Elevated white blood cell count, unspecified: Secondary | ICD-10-CM | POA: Diagnosis not present

## 2023-01-21 DIAGNOSIS — I255 Ischemic cardiomyopathy: Secondary | ICD-10-CM | POA: Diagnosis not present

## 2023-01-21 DIAGNOSIS — I5023 Acute on chronic systolic (congestive) heart failure: Secondary | ICD-10-CM | POA: Diagnosis not present

## 2023-01-21 DIAGNOSIS — L89226 Pressure-induced deep tissue damage of left hip: Secondary | ICD-10-CM | POA: Diagnosis not present

## 2023-01-21 DIAGNOSIS — Z813 Family history of other psychoactive substance abuse and dependence: Secondary | ICD-10-CM

## 2023-01-21 DIAGNOSIS — G9341 Metabolic encephalopathy: Secondary | ICD-10-CM | POA: Diagnosis present

## 2023-01-21 DIAGNOSIS — I3139 Other pericardial effusion (noninflammatory): Secondary | ICD-10-CM | POA: Diagnosis not present

## 2023-01-21 DIAGNOSIS — E662 Morbid (severe) obesity with alveolar hypoventilation: Secondary | ICD-10-CM | POA: Diagnosis present

## 2023-01-21 DIAGNOSIS — I4821 Permanent atrial fibrillation: Secondary | ICD-10-CM | POA: Diagnosis not present

## 2023-01-21 DIAGNOSIS — R7303 Prediabetes: Secondary | ICD-10-CM | POA: Diagnosis present

## 2023-01-21 DIAGNOSIS — I428 Other cardiomyopathies: Secondary | ICD-10-CM | POA: Diagnosis present

## 2023-01-21 DIAGNOSIS — Z8614 Personal history of Methicillin resistant Staphylococcus aureus infection: Secondary | ICD-10-CM

## 2023-01-21 DIAGNOSIS — L989 Disorder of the skin and subcutaneous tissue, unspecified: Secondary | ICD-10-CM | POA: Diagnosis not present

## 2023-01-21 DIAGNOSIS — R6520 Severe sepsis without septic shock: Secondary | ICD-10-CM | POA: Diagnosis present

## 2023-01-21 DIAGNOSIS — T80219D Unspecified infection due to central venous catheter, subsequent encounter: Secondary | ICD-10-CM

## 2023-01-21 DIAGNOSIS — Z66 Do not resuscitate: Secondary | ICD-10-CM | POA: Diagnosis not present

## 2023-01-21 DIAGNOSIS — J13 Pneumonia due to Streptococcus pneumoniae: Secondary | ICD-10-CM | POA: Diagnosis present

## 2023-01-21 DIAGNOSIS — Z91148 Patient's other noncompliance with medication regimen for other reason: Secondary | ICD-10-CM

## 2023-01-21 DIAGNOSIS — E669 Obesity, unspecified: Secondary | ICD-10-CM | POA: Diagnosis not present

## 2023-01-21 DIAGNOSIS — Z1152 Encounter for screening for COVID-19: Secondary | ICD-10-CM

## 2023-01-21 DIAGNOSIS — I482 Chronic atrial fibrillation, unspecified: Secondary | ICD-10-CM | POA: Insufficient documentation

## 2023-01-21 DIAGNOSIS — T8249XA Other complication of vascular dialysis catheter, initial encounter: Secondary | ICD-10-CM | POA: Diagnosis not present

## 2023-01-21 DIAGNOSIS — L89213 Pressure ulcer of right hip, stage 3: Secondary | ICD-10-CM | POA: Diagnosis not present

## 2023-01-21 DIAGNOSIS — J9600 Acute respiratory failure, unspecified whether with hypoxia or hypercapnia: Secondary | ICD-10-CM | POA: Diagnosis present

## 2023-01-21 DIAGNOSIS — E875 Hyperkalemia: Secondary | ICD-10-CM | POA: Diagnosis not present

## 2023-01-21 DIAGNOSIS — I4891 Unspecified atrial fibrillation: Secondary | ICD-10-CM | POA: Diagnosis not present

## 2023-01-21 DIAGNOSIS — R053 Chronic cough: Secondary | ICD-10-CM | POA: Diagnosis present

## 2023-01-21 DIAGNOSIS — Z91158 Patient's noncompliance with renal dialysis for other reason: Secondary | ICD-10-CM

## 2023-01-21 DIAGNOSIS — L89310 Pressure ulcer of right buttock, unstageable: Secondary | ICD-10-CM | POA: Diagnosis not present

## 2023-01-21 DIAGNOSIS — E8770 Fluid overload, unspecified: Secondary | ICD-10-CM | POA: Diagnosis not present

## 2023-01-21 DIAGNOSIS — Z9889 Other specified postprocedural states: Secondary | ICD-10-CM

## 2023-01-21 DIAGNOSIS — L89896 Pressure-induced deep tissue damage of other site: Secondary | ICD-10-CM | POA: Diagnosis present

## 2023-01-21 DIAGNOSIS — Z789 Other specified health status: Secondary | ICD-10-CM | POA: Diagnosis not present

## 2023-01-21 DIAGNOSIS — E161 Other hypoglycemia: Secondary | ICD-10-CM | POA: Diagnosis not present

## 2023-01-21 DIAGNOSIS — I4892 Unspecified atrial flutter: Secondary | ICD-10-CM | POA: Diagnosis present

## 2023-01-21 DIAGNOSIS — A403 Sepsis due to Streptococcus pneumoniae: Secondary | ICD-10-CM | POA: Diagnosis not present

## 2023-01-21 DIAGNOSIS — Z7401 Bed confinement status: Secondary | ICD-10-CM

## 2023-01-21 DIAGNOSIS — Z9911 Dependence on respirator [ventilator] status: Secondary | ICD-10-CM | POA: Diagnosis not present

## 2023-01-21 DIAGNOSIS — Z91119 Patient's noncompliance with dietary regimen due to unspecified reason: Secondary | ICD-10-CM

## 2023-01-21 DIAGNOSIS — E039 Hypothyroidism, unspecified: Secondary | ICD-10-CM | POA: Diagnosis not present

## 2023-01-21 DIAGNOSIS — K219 Gastro-esophageal reflux disease without esophagitis: Secondary | ICD-10-CM | POA: Diagnosis present

## 2023-01-21 DIAGNOSIS — M79672 Pain in left foot: Secondary | ICD-10-CM | POA: Diagnosis not present

## 2023-01-21 DIAGNOSIS — Z888 Allergy status to other drugs, medicaments and biological substances status: Secondary | ICD-10-CM

## 2023-01-21 DIAGNOSIS — E1122 Type 2 diabetes mellitus with diabetic chronic kidney disease: Secondary | ICD-10-CM | POA: Diagnosis not present

## 2023-01-21 DIAGNOSIS — N186 End stage renal disease: Secondary | ICD-10-CM | POA: Diagnosis not present

## 2023-01-21 DIAGNOSIS — R939 Diagnostic imaging inconclusive due to excess body fat of patient: Secondary | ICD-10-CM | POA: Diagnosis not present

## 2023-01-21 DIAGNOSIS — B953 Streptococcus pneumoniae as the cause of diseases classified elsewhere: Secondary | ICD-10-CM | POA: Diagnosis not present

## 2023-01-21 DIAGNOSIS — Y95 Nosocomial condition: Secondary | ICD-10-CM | POA: Diagnosis not present

## 2023-01-21 DIAGNOSIS — G8929 Other chronic pain: Secondary | ICD-10-CM | POA: Diagnosis present

## 2023-01-21 DIAGNOSIS — L89893 Pressure ulcer of other site, stage 3: Secondary | ICD-10-CM | POA: Diagnosis not present

## 2023-01-21 DIAGNOSIS — J80 Acute respiratory distress syndrome: Secondary | ICD-10-CM | POA: Diagnosis not present

## 2023-01-21 DIAGNOSIS — L8990 Pressure ulcer of unspecified site, unspecified stage: Secondary | ICD-10-CM | POA: Insufficient documentation

## 2023-01-21 DIAGNOSIS — R509 Fever, unspecified: Secondary | ICD-10-CM | POA: Diagnosis not present

## 2023-01-21 DIAGNOSIS — I5020 Unspecified systolic (congestive) heart failure: Secondary | ICD-10-CM | POA: Diagnosis not present

## 2023-01-21 DIAGNOSIS — E8779 Other fluid overload: Secondary | ICD-10-CM | POA: Diagnosis not present

## 2023-01-21 DIAGNOSIS — I509 Heart failure, unspecified: Secondary | ICD-10-CM

## 2023-01-21 DIAGNOSIS — D638 Anemia in other chronic diseases classified elsewhere: Secondary | ICD-10-CM | POA: Diagnosis not present

## 2023-01-21 DIAGNOSIS — Z4682 Encounter for fitting and adjustment of non-vascular catheter: Secondary | ICD-10-CM | POA: Diagnosis not present

## 2023-01-21 DIAGNOSIS — J90 Pleural effusion, not elsewhere classified: Secondary | ICD-10-CM | POA: Diagnosis not present

## 2023-01-21 DIAGNOSIS — R0603 Acute respiratory distress: Secondary | ICD-10-CM | POA: Diagnosis not present

## 2023-01-21 DIAGNOSIS — Z6841 Body Mass Index (BMI) 40.0 and over, adult: Secondary | ICD-10-CM

## 2023-01-21 DIAGNOSIS — L89316 Pressure-induced deep tissue damage of right buttock: Secondary | ICD-10-CM | POA: Diagnosis not present

## 2023-01-21 DIAGNOSIS — I5084 End stage heart failure: Secondary | ICD-10-CM | POA: Diagnosis not present

## 2023-01-21 DIAGNOSIS — Z711 Person with feared health complaint in whom no diagnosis is made: Secondary | ICD-10-CM | POA: Diagnosis not present

## 2023-01-21 DIAGNOSIS — Z452 Encounter for adjustment and management of vascular access device: Secondary | ICD-10-CM | POA: Diagnosis not present

## 2023-01-21 DIAGNOSIS — Z7189 Other specified counseling: Secondary | ICD-10-CM | POA: Diagnosis not present

## 2023-01-21 DIAGNOSIS — Z79899 Other long term (current) drug therapy: Secondary | ICD-10-CM

## 2023-01-21 DIAGNOSIS — L97129 Non-pressure chronic ulcer of left thigh with unspecified severity: Secondary | ICD-10-CM | POA: Diagnosis not present

## 2023-01-21 DIAGNOSIS — R700 Elevated erythrocyte sedimentation rate: Secondary | ICD-10-CM | POA: Diagnosis present

## 2023-01-21 DIAGNOSIS — L89159 Pressure ulcer of sacral region, unspecified stage: Secondary | ICD-10-CM | POA: Diagnosis not present

## 2023-01-21 DIAGNOSIS — Z4659 Encounter for fitting and adjustment of other gastrointestinal appliance and device: Secondary | ICD-10-CM | POA: Diagnosis not present

## 2023-01-21 DIAGNOSIS — Z5986 Financial insecurity: Secondary | ICD-10-CM

## 2023-01-21 DIAGNOSIS — R918 Other nonspecific abnormal finding of lung field: Secondary | ICD-10-CM | POA: Diagnosis not present

## 2023-01-21 DIAGNOSIS — E8729 Other acidosis: Secondary | ICD-10-CM | POA: Diagnosis not present

## 2023-01-21 DIAGNOSIS — E876 Hypokalemia: Secondary | ICD-10-CM | POA: Diagnosis not present

## 2023-01-21 DIAGNOSIS — J9601 Acute respiratory failure with hypoxia: Secondary | ICD-10-CM | POA: Diagnosis not present

## 2023-01-21 DIAGNOSIS — I9589 Other hypotension: Secondary | ICD-10-CM | POA: Diagnosis not present

## 2023-01-21 DIAGNOSIS — K5900 Constipation, unspecified: Secondary | ICD-10-CM | POA: Diagnosis not present

## 2023-01-21 DIAGNOSIS — Z6372 Alcoholism and drug addiction in family: Secondary | ICD-10-CM

## 2023-01-21 DIAGNOSIS — Z8619 Personal history of other infectious and parasitic diseases: Secondary | ICD-10-CM

## 2023-01-21 DIAGNOSIS — L22 Diaper dermatitis: Secondary | ICD-10-CM | POA: Diagnosis not present

## 2023-01-21 DIAGNOSIS — I517 Cardiomegaly: Secondary | ICD-10-CM | POA: Diagnosis not present

## 2023-01-21 DIAGNOSIS — E781 Pure hyperglyceridemia: Secondary | ICD-10-CM | POA: Diagnosis present

## 2023-01-21 DIAGNOSIS — F419 Anxiety disorder, unspecified: Secondary | ICD-10-CM | POA: Diagnosis present

## 2023-01-21 HISTORY — DX: Morbid (severe) obesity with alveolar hypoventilation: E66.2

## 2023-01-21 HISTORY — DX: Anemia, unspecified: D64.9

## 2023-01-21 HISTORY — DX: Chronic respiratory failure with hypoxia: J96.11

## 2023-01-21 HISTORY — DX: Paroxysmal atrial fibrillation: I48.0

## 2023-01-21 HISTORY — DX: Unspecified atrial flutter: I48.92

## 2023-01-21 HISTORY — DX: Poisoning by other antidysrhythmic drugs, accidental (unintentional), initial encounter: T46.2X1A

## 2023-01-21 HISTORY — DX: Other cardiomyopathies: I42.8

## 2023-01-21 LAB — CBC
HCT: 31.9 % — ABNORMAL LOW (ref 39.0–52.0)
Hemoglobin: 10.2 g/dL — ABNORMAL LOW (ref 13.0–17.0)
MCH: 32.8 pg (ref 26.0–34.0)
MCHC: 32 g/dL (ref 30.0–36.0)
MCV: 102.6 fL — ABNORMAL HIGH (ref 80.0–100.0)
Platelets: 208 10*3/uL (ref 150–400)
RBC: 3.11 MIL/uL — ABNORMAL LOW (ref 4.22–5.81)
RDW: 16.7 % — ABNORMAL HIGH (ref 11.5–15.5)
WBC: 21.6 10*3/uL — ABNORMAL HIGH (ref 4.0–10.5)
nRBC: 0 % (ref 0.0–0.2)

## 2023-01-21 LAB — CBC WITH DIFFERENTIAL/PLATELET
Abs Immature Granulocytes: 0.13 10*3/uL — ABNORMAL HIGH (ref 0.00–0.07)
Basophils Absolute: 0.1 10*3/uL (ref 0.0–0.1)
Basophils Relative: 0 %
Eosinophils Absolute: 0 10*3/uL (ref 0.0–0.5)
Eosinophils Relative: 0 %
HCT: 33.1 % — ABNORMAL LOW (ref 39.0–52.0)
Hemoglobin: 10.5 g/dL — ABNORMAL LOW (ref 13.0–17.0)
Immature Granulocytes: 1 %
Lymphocytes Relative: 2 %
Lymphs Abs: 0.4 10*3/uL — ABNORMAL LOW (ref 0.7–4.0)
MCH: 31.7 pg (ref 26.0–34.0)
MCHC: 31.7 g/dL (ref 30.0–36.0)
MCV: 100 fL (ref 80.0–100.0)
Monocytes Absolute: 0.7 10*3/uL (ref 0.1–1.0)
Monocytes Relative: 3 %
Neutro Abs: 19.4 10*3/uL — ABNORMAL HIGH (ref 1.7–7.7)
Neutrophils Relative %: 94 %
Platelets: 192 10*3/uL (ref 150–400)
RBC: 3.31 MIL/uL — ABNORMAL LOW (ref 4.22–5.81)
RDW: 16.9 % — ABNORMAL HIGH (ref 11.5–15.5)
WBC: 20.7 10*3/uL — ABNORMAL HIGH (ref 4.0–10.5)
nRBC: 0 % (ref 0.0–0.2)

## 2023-01-21 LAB — RENAL FUNCTION PANEL
Albumin: 3.3 g/dL — ABNORMAL LOW (ref 3.5–5.0)
Anion gap: 17 — ABNORMAL HIGH (ref 5–15)
BUN: 72 mg/dL — ABNORMAL HIGH (ref 6–20)
CO2: 16 mmol/L — ABNORMAL LOW (ref 22–32)
Calcium: 8.7 mg/dL — ABNORMAL LOW (ref 8.9–10.3)
Chloride: 97 mmol/L — ABNORMAL LOW (ref 98–111)
Creatinine, Ser: 13.16 mg/dL — ABNORMAL HIGH (ref 0.61–1.24)
GFR, Estimated: 5 mL/min — ABNORMAL LOW (ref >60)
Glucose, Bld: 78 mg/dL (ref 70–99)
Phosphorus: 3.3 mg/dL (ref 2.5–4.6)
Potassium: 6.2 mmol/L — ABNORMAL HIGH (ref 3.5–5.1)
Sodium: 130 mmol/L — ABNORMAL LOW (ref 135–145)

## 2023-01-21 LAB — I-STAT CHEM 8, ED
BUN: 78 mg/dL — ABNORMAL HIGH (ref 6–20)
Calcium, Ion: 0.99 mmol/L — ABNORMAL LOW (ref 1.15–1.40)
Chloride: 104 mmol/L (ref 98–111)
Creatinine, Ser: 14 mg/dL — ABNORMAL HIGH (ref 0.61–1.24)
Glucose, Bld: 85 mg/dL (ref 70–99)
HCT: 35 % — ABNORMAL LOW (ref 39.0–52.0)
Hemoglobin: 11.9 g/dL — ABNORMAL LOW (ref 13.0–17.0)
Potassium: 5.8 mmol/L — ABNORMAL HIGH (ref 3.5–5.1)
Sodium: 132 mmol/L — ABNORMAL LOW (ref 135–145)
TCO2: 20 mmol/L — ABNORMAL LOW (ref 22–32)

## 2023-01-21 LAB — COMPREHENSIVE METABOLIC PANEL
ALT: 11 U/L (ref 0–44)
AST: 18 U/L (ref 15–41)
Albumin: 3.4 g/dL — ABNORMAL LOW (ref 3.5–5.0)
Alkaline Phosphatase: 97 U/L (ref 38–126)
Anion gap: 17 — ABNORMAL HIGH (ref 5–15)
BUN: 70 mg/dL — ABNORMAL HIGH (ref 6–20)
CO2: 18 mmol/L — ABNORMAL LOW (ref 22–32)
Calcium: 8.9 mg/dL (ref 8.9–10.3)
Chloride: 97 mmol/L — ABNORMAL LOW (ref 98–111)
Creatinine, Ser: 13.54 mg/dL — ABNORMAL HIGH (ref 0.61–1.24)
GFR, Estimated: 4 mL/min — ABNORMAL LOW (ref >60)
Glucose, Bld: 91 mg/dL (ref 70–99)
Potassium: 5.4 mmol/L — ABNORMAL HIGH (ref 3.5–5.1)
Sodium: 132 mmol/L — ABNORMAL LOW (ref 135–145)
Total Bilirubin: 2.3 mg/dL — ABNORMAL HIGH (ref 0.3–1.2)
Total Protein: 7.6 g/dL (ref 6.5–8.1)

## 2023-01-21 LAB — HEPATITIS B SURFACE ANTIGEN: Hepatitis B Surface Ag: NONREACTIVE

## 2023-01-21 LAB — LACTIC ACID, PLASMA
Lactic Acid, Venous: 2.5 mmol/L (ref 0.5–1.9)
Lactic Acid, Venous: 2.4 mmol/L (ref 0.5–1.9)

## 2023-01-21 LAB — I-STAT VENOUS BLOOD GAS, ED
Acid-base deficit: 4 mmol/L — ABNORMAL HIGH (ref 0.0–2.0)
Bicarbonate: 18.9 mmol/L — ABNORMAL LOW (ref 20.0–28.0)
Calcium, Ion: 0.98 mmol/L — ABNORMAL LOW (ref 1.15–1.40)
HCT: 33 % — ABNORMAL LOW (ref 39.0–52.0)
Hemoglobin: 11.2 g/dL — ABNORMAL LOW (ref 13.0–17.0)
O2 Saturation: 85 %
Patient temperature: 98.3
Potassium: 5.8 mmol/L — ABNORMAL HIGH (ref 3.5–5.1)
Sodium: 132 mmol/L — ABNORMAL LOW (ref 135–145)
TCO2: 20 mmol/L — ABNORMAL LOW (ref 22–32)
pCO2, Ven: 28 mmHg — ABNORMAL LOW (ref 44–60)
pH, Ven: 7.438 — ABNORMAL HIGH (ref 7.25–7.43)
pO2, Ven: 47 mmHg — ABNORMAL HIGH (ref 32–45)

## 2023-01-21 LAB — TROPONIN I (HIGH SENSITIVITY)
Troponin I (High Sensitivity): 57 ng/L — ABNORMAL HIGH (ref <18)
Troponin I (High Sensitivity): 62 ng/L — ABNORMAL HIGH (ref <18)

## 2023-01-21 LAB — BRAIN NATRIURETIC PEPTIDE: B Natriuretic Peptide: 1528.6 pg/mL — ABNORMAL HIGH (ref 0.0–100.0)

## 2023-01-21 MED ORDER — ANTICOAGULANT SODIUM CITRATE 4% (200MG/5ML) IV SOLN
5.0000 mL | Status: DC | PRN
  Filled 2023-01-21: qty 5

## 2023-01-21 MED ORDER — SODIUM CHLORIDE 0.9 % IV SOLN: 2.0000 g | INTRAVENOUS | Status: DC

## 2023-01-21 MED ORDER — ALTEPLASE 2 MG IJ SOLR: 2.0000 mg | Freq: Once | INTRAMUSCULAR | Status: DC | PRN

## 2023-01-21 MED ORDER — LIDOCAINE-PRILOCAINE 2.5-2.5 % EX CREA: 1.0000 | TOPICAL_CREAM | CUTANEOUS | Status: DC | PRN

## 2023-01-21 MED ORDER — POLYETHYLENE GLYCOL 3350 17 G PO PACK: 17.0000 g | PACK | Freq: Every day | ORAL | Status: DC | PRN

## 2023-01-21 MED ORDER — DOXERCALCIFEROL 2.5 MCG PO CAPS
4.0000 ug | ORAL_CAPSULE | ORAL | Status: DC
  Filled 2023-01-21: qty 3

## 2023-01-21 MED ORDER — SODIUM CHLORIDE 0.9 % IV SOLN
1.0000 g | INTRAVENOUS | Status: DC
  Administered 2023-01-21: 1 g via INTRAVENOUS
  Filled 2023-01-21: qty 10

## 2023-01-21 MED ORDER — PENTAFLUOROPROP-TETRAFLUOROETH EX AERO: 1.0000 | INHALATION_SPRAY | CUTANEOUS | Status: DC | PRN

## 2023-01-21 MED ORDER — ACETAMINOPHEN 10 MG/ML IV SOLN
1000.0000 mg | Freq: Once | INTRAVENOUS | Status: AC
  Administered 2023-01-21: 1000 mg via INTRAVENOUS
  Filled 2023-01-21: qty 100

## 2023-01-21 MED ORDER — VANCOMYCIN HCL 10 G IV SOLR
2500.0000 mg | INTRAVENOUS | Status: DC
  Filled 2023-01-21 (×2): qty 25

## 2023-01-21 MED ORDER — APIXABAN 5 MG PO TABS
5.0000 mg | ORAL_TABLET | Freq: Two times a day (BID) | ORAL | Status: DC
  Filled 2023-01-21: qty 1

## 2023-01-21 MED ORDER — MELATONIN 5 MG PO TABS
5.0000 mg | ORAL_TABLET | Freq: Every day | ORAL | Status: DC
  Administered 2023-01-22: 5 mg via ORAL
  Filled 2023-01-21: qty 1

## 2023-01-21 MED ORDER — CHLORHEXIDINE GLUCONATE CLOTH 2 % EX PADS
6.0000 | MEDICATED_PAD | Freq: Every day | CUTANEOUS | Status: DC
  Administered 2023-01-22 – 2023-03-03 (×43): 6 via TOPICAL

## 2023-01-21 MED ORDER — ACETAMINOPHEN 325 MG PO TABS: 650.0000 mg | ORAL_TABLET | Freq: Four times a day (QID) | ORAL | Status: DC | PRN

## 2023-01-21 MED ORDER — MIDODRINE HCL 5 MG PO TABS: 5.0000 mg | ORAL_TABLET | Freq: Three times a day (TID) | ORAL | Status: DC

## 2023-01-21 MED ORDER — VANCOMYCIN HCL IN DEXTROSE 1-5 GM/200ML-% IV SOLN
1000.0000 mg | INTRAVENOUS | Status: DC
  Filled 2023-01-21: qty 200

## 2023-01-21 MED ORDER — HEPARIN SODIUM (PORCINE) 1000 UNIT/ML IJ SOLN
INTRAMUSCULAR | Status: AC
  Filled 2023-01-21: qty 4

## 2023-01-21 MED ORDER — ACETAMINOPHEN 650 MG RE SUPP: 650.0000 mg | Freq: Four times a day (QID) | RECTAL | Status: DC | PRN

## 2023-01-21 MED ORDER — LIDOCAINE HCL (PF) 1 % IJ SOLN: 5.0000 mL | INTRAMUSCULAR | Status: DC | PRN

## 2023-01-21 MED ORDER — GUAIFENESIN ER 600 MG PO TB12
600.0000 mg | ORAL_TABLET | Freq: Two times a day (BID) | ORAL | Status: DC
  Administered 2023-01-22: 600 mg via ORAL
  Filled 2023-01-21 (×2): qty 1

## 2023-01-21 MED ORDER — CINACALCET HCL 30 MG PO TABS
60.0000 mg | ORAL_TABLET | ORAL | Status: DC
  Filled 2023-01-21: qty 2

## 2023-01-21 NOTE — Progress Notes (Signed)
Pharmacy Antibiotic Note  Phillip Young is a 37 y.o. male admitted on 01/21/2023 with sepsis.  Pharmacy has been consulted for Cefepime and Vancomycin dosing.  Pt with ESRD - T/T/S HD as o/p. Pt is noncompliant with HD and does not stay for full treatment times. Per nephrology, plan to dialyze later tonight and again tomorrow.  Plan: Cefepime 1gm IV q24h Vancomycin 2500mg  IV now then 1000 mg IV Q HD Will f/u HD schedule/tolerance, micro data, and pt's clinical condition Pre-HD Vanc levels prn   Height: 6' (182.9 cm) IBW/kg (Calculated) : 77.6  Temp (24hrs), Avg:102 F (38.9 C), Min:102 F (38.9 C), Max:102 F (38.9 C)  Recent Labs  Lab 01/21/23 1739 01/21/23 1749  WBC 20.7*  --   CREATININE  --  14.00*    CrCl cannot be calculated (Unknown ideal weight.).    Allergies  Allergen Reactions   Amiodarone Other (See Comments)    Suspicion for amiodarone lung/hepatotoxicity  Other Reaction(s): Suspicion amiodarone toxicity   Coreg [Carvedilol] Shortness Of Breath and Diarrhea    Wheezing    Heparin Other (See Comments)    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, SRA positive   Metoprolol Other (See Comments)    near syncope    Antimicrobials this admission: 6/28 Vancomycin >>  6/28 Cefepime >>   Microbiology results: 6/28 BCx:   Thank you for allowing pharmacy to be a part of this patient's care.  Christoper Fabian, PharmD, BCPS Please see amion for complete clinical pharmacist phone list 01/21/2023 6:33 PM

## 2023-01-21 NOTE — ED Notes (Signed)
MD is aware that pt is requesting pain meds. Tylenol IV given at this time. Will continue to monitor. Pt reports left sided chest pain radiating to left arm. Cardiac monitoring in place. Pt was previously given Aspirin 324mg  by EMS. Will continue to monitor.

## 2023-01-21 NOTE — Progress Notes (Signed)
Pt transported to dialysis on BiPAP. Pt complaint SOB just outside door of unit and removed BiPAP. Placed on [place] @6L  until [facility] obtained. Pt placed on 15L per pt request and stated "better". BiPAP remains on S/B

## 2023-01-21 NOTE — H&P (Cosign Needed Addendum)
Date: 01/21/2023         Patient Name:  Phillip Young MRN: [id number]  DOB: [birth year] Age / Sex: 37 y.o., male   PCP: Marrianne Mood, MD         Medical Service: Internal Medicine Teaching Service         Attending Physician: Dr. Mayford Knife, Dorene Ar, MD    First Contact: Dr. Daiva Eves Pager: [phone]  Second Contact: Dr. Cyndie Chime Pager: [phone]       After Hours (After 5p/  First Contact Pager: [phone]  weekends / holidays): Second Contact Pager: [phone]   Chief Concern: Chest pain, shortness of breath  History of Present Illness: 37 year old male with serious chronic illness including end-stage renal disease and chronic systolic heart failure presents with 2 days of worsening shortness of breath and chest pain.  He was in his usual state of health 3 days ago, had a good outpatient hemodialysis session that day.  Started to feel badly the following day with shortness of breath and left-sided chest pain.  Chest pain is described as constant but waxes and wanes.  Seems to get worse with activity but also worse with breathing.  Rated as severe now.  Also having pretty significant shortness of breath that is activity limiting.  He wonders if this is due to fluid intake indiscretion as he has been drinking a lot of fruit juice of late.  He did not go to dialysis yesterday, per schedule.  Today he was so winded that he could barely move so he called EMS for transport to the hospital.  He reports a cough with increased sputum.  I am able to see that he has coughed up some green, blood-streaked sputum into an emesis basin.  In the emergency department he was febrile, tachycardic, and hypoxic.  BiPAP was started.  Nephrology was consulted for urgent hemodialysis for volume overload.  At the time of my interview, this person was in dialysis, still feeling quite poorly.  Review of Systems  Constitutional:  Positive for malaise/fatigue. Negative for chills and fever.  Respiratory:   Positive for cough, sputum production and shortness of breath.   Cardiovascular:  Negative for leg swelling.  Gastrointestinal:  Positive for vomiting. Negative for abdominal pain and diarrhea.  Genitourinary:  Positive for hematuria.  Musculoskeletal:  Positive for back pain and neck pain ("Stiff neck").       Chronic back pain.  Skin:  Negative for rash.       Chronic papular rash on arms, unchanged of late. Hidradenitis suppurativa, stable. Perianal abscess, chronic, no drainage recently.   Allergies: Allergies  Allergen Reactions   Amiodarone Other (See Comments)    Suspicion for amiodarone lung/hepatotoxicity  Other Reaction(s): Suspicion amiodarone toxicity   Coreg [Carvedilol] Shortness Of Breath and Diarrhea    Wheezing    Heparin Other (See Comments)    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, SRA positive   Metoprolol Other (See Comments)    near syncope   Past Medical History: Reviewed, notable for chronic hypoxic respiratory failure on home supplemental oxygen, chronic systolic heart failure with ejection fraction of around 20%, end-stage renal disease on hemodialysis, paroxysmal A-fib/flutter, morbid obesity, OSA, hidradenitis suppurativa, MSSA bacteremia in September 2022, heparin-induced thrombocytopenia.  Medications: Current Outpatient Medications  Medication Instructions   apixaban (ELIQUIS) 5 mg, Oral, 2 times daily   calamine lotion 1 Application, Topical, 2 times daily   camphor-menthol (SARNA) lotion Topical, As needed   clindamycin (CLINDAGEL) 1 % gel Topical,  2 times daily, Apply to hydradenitis wounds   fluticasone furoate-vilanterol (BREO ELLIPTA) 100-25 MCG/ACT AEPB 1 puff, Inhalation, Daily   lanthanum (FOSRENOL) 2,000 mg, Oral, 3 times daily with meals   melatonin 5 mg, Oral, Daily at bedtime   midodrine (PROAMATINE) 5 MG tablet TAKE 1 TABLET(5 MG) BY MOUTH THREE TIMES DAILY WITH MEALS   torsemide (DEMADEX) 100 MG tablet TAKE 1 TABLET BY MOUTH 3 TIMES A  WEEK ON NON-DIALYSIS DAYS.   Reports good adherence to his apixaban.  Stopped taking torsemide recently because he did not feel it was working.  Surgical History: Reviewed, no recent surgeries or procedures. Notably, the tunneled right HD catheter has been in place for over a year.  Family History:  Reviewed, no pertinent updates.  Social History:  Lives in [place] with his uncle.  Goes to hemodialysis on a TTS schedule.  Reports good adherence to the schedule of late, although does leave dialysis early if he is not "tolerating" sessions for reasons such as cramps.  He does not use alcohol.  Does not use drugs.  Quit smoking cigarettes in 2018.  Follows with Dr. Shirlee Latch for heart failure.  Follows with Dr. Marisue Humble for kidney disease.  Follows with [facility] for [facility] care, last saw Dr. Kirke Corin in April 2024.  Physical Exam: Blood pressure 93/73, pulse (!) 144, temperature 98.3 F (36.8 C), temperature source Oral, resp. rate (!) 29, height 6' (1.829 m), SpO2 100 %.  Uncomfortable, tired appearing male Oral mucous membranes are moist and pink, neck is supple Heart rate is tachycardic, rhythm sounds regular but uncertain due to rate, no appreciable murmurs, no appreciable JVD, no lower extremity pitting edema, capillary refill in the upper extremities is brisk, extremities are warm Breathing is tachypneic, nasal cannula with 15 L supplemental oxygen, lungs with scant bibasilar crackles, scant expiratory wheeze on the right, paroxysms of cough with blood-streaked green sputum noted Abdomen is nontender, no CVA tenderness Skin is warm and dry with nonerythematous papular rash on the upper extremities, skin findings of hidradenitis suppurativa in the axilla, no tenderness, fluctuance, drainage, or erythema around right tunneled IJ No midline spinal tenderness Alert and oriented, speech is normal, no facial asymmetry, tongue is midline, moving all extremities spontaneously  EKG:  Atrial  fibrillation, no ST segment elevation or depression  Labs: Sodium 130 Potassium 6.2 Bicarb 16 Glucose 78 BUN/creatinine 72/13.16 Anion gap 17 Liver enzymes are normal  BNP 1528.6 Troponin 57 =>  Venous lactate 2.5 =>  WBC 21.6 Hemoglobin 10.2 MCV 102.6 Platelets 208  Blood cultures are drawn, pending  Images and other studies: Chest x-ray, stable cardiomegaly, stable right IJ catheter, central vascular congestion, no apparent opacity or effusion but limited evaluation due to body habitus and portable technique.  Assessment & Plan:  Jahrell Salasar is a 37 y.o. with ESRD and HFrEF admitted for acute hypoxic respiratory failure due to hypervolemia, there is also concern for infection given his presenting fever.  Principal Problem:   Acute hypoxic respiratory failure ([name]) Active Problems:   Morbid obesity ([name])   Chronic systolic CHF (congestive heart failure) ([name])   ESRD (end stage renal disease) on dialysis ([name])   Sepsis ([name])   A-fib ([name])  Acute on chronic hypoxic respiratory failure Hypervolemic state History supports volume overload given comorbid heart failure, nonadherence to dialysis and indiscretion regarding fluid intake.  BNP elevated. Chest x-ray with some central vascular congestion.  Volume status exam is difficult on this obese patient.  Will see how  he does after this trial of dialysis.  If his hypoxia and tachycardia do not improve I favor imaging for pulmonary embolism.  I also worry about hypoxia due to pneumonia given his cough with sputum production.  CTA of the chest will help evaluate for this as well.  Currently with SpO2 stable above goal on nasal cannula.  Will admit to progressive for continuous pulse oximetry, BiPAP if needed, and telemetry. - Supplemental oxygen or BiPAP for SpO2 >92% - Currently in dialysis unit for urgent HD, appreciate nephrology assistance - Reassess after dialysis, consider CTA chest  Update @ 6:34 AM This person  tolerated HD well, but symptoms did not improve much.  Given ongoing tachycardia, tachypnea, increased oxygen requirement and pleuritic chest pain decision was made to proceed with CTA of chest to evaluate for PE.  Discussed this with the patient prior to ordering study.  He reported history of adverse effects with contrast to radiology technicians and this study was canceled.  Will attempt to premedicate prior to sending back down.  Wells score puts him in high risk group.  Sepsis Presents with fever and leukocytosis.  Cough with sputum and respiratory failure concerning for pneumonia but no apparent signs on chest x-ray although it was a poor quality study.  High risk for bacteremia in this person with chronic indwelling central line as well.  Also testing for COVID, flu, RSV.  Tachycardia and tachypnea probably driven by volume overload rather than sepsis, will see if this improves with HD.  Got a dose of vancomycin and cefepime in the ED.  I will continue coverage for MRSA but index of suspicion for Pseudomonas is low so will discontinue cefepime in favor of ceftriaxone.  Hemodynamically stable.  Interpreting lactate in context of ESRD.  Blood cultures drawn.  Appreciate pharmacy assistance with antibiotic dosing. - IV vancomycin - Ceftriaxone 2 g IV every 24 hours - Blood cultures pending  Atrial fibrillation with RVR Presents with A-fib/RVR complicating his co-occurring respiratory failure and sepsis.  Will trend heart rate as we optimize volume status and treat underlying infection.  Continuing home apixaban for anticoagulation.  If parenteral therapy is needed, avoid heparin as this person has a history of HIT.  Admit to progressive for monitoring on telemetry. - Apixaban 5 mg twice daily  Chest pain Chest pain seems atypical for angina given its pleuritic nature.  Marginally elevated troponin, interpreted in setting of ESRD.  Trend is flat tonight.  I do not suspect acute MI now.  I wonder if  his chest pain may be due to an underlying process in the lungs like pneumonia or PE.  Chronic systolic heart failure Managing volume with hemodialysis.  Does not appear to be in a low output state.  Maintaining blood pressure even on HD currently.  Was taking torsemide until recently, said he stopped because he did not feel like it was working.  Follows with Dr. Shirlee Latch outpatient.  Unfortunately, GDMT is limited because of kidney disease and hypotension.  Has not gotten ICD due to obesity and dialysis.  ESRD Poor adherence to outpatient hemodialysis and fluid restriction.  Suspect this is a significant contributor to his presentation this evening.  Also with hyperkalemia and metabolic acidosis.  Now in urgent HD.  Follows with Dr. Marisue Humble outpatient.  Access via right tunneled IJ central line, source of potential infection.  Takes midodrine to boost blood pressure for HD. - Continue midodrine 5 mg p.o. 3 times daily with meals  Hematuria Still oliguric  despite ESRD.  Reports frankly red urine this week, wonders if it is related to drinking fruit juice.  No flank pain or CVA tenderness.  Will try to get a UA.  Level of care: Progressive Diet: Renal with 1200 mL fluid restriction IVF: None VTE: Apixaban 5 mg twice daily Code: Full Surrogate: Mother, Juellz Picariello, [phone]  Signed: Marrianne Mood MD 01/21/2023, 10:45 PM

## 2023-01-21 NOTE — Progress Notes (Signed)
Called by ED. Here with SOB, volume overload. CXR personally reviewed. Typically noncompliant with HD, does not stay for full treatment times and has high IDWG upon review of his outpatient HD records. Will plan for HD today and tomorrow.  Outpatient orders: [facility], TTS. 4hrs 15 min. F180. Flow rates: 425/autoflow 1.5. 3k 2.5cal. EDW 180kg. Access: TDC. Meds: Mircers 200 mcg every two weeks (last dose 6/13), sensipar 60mg  three times per week, hectorol three times per week  Plan: -HD tonight and tomorrow. UF as tolerated. Overall, just needs better compliance and needs to restrict fluids -Full consult to follow -Discussed with HD staff -Call with any questions/concerns.

## 2023-01-21 NOTE — ED Triage Notes (Signed)
Chest pain since Tuesday. Missed dialysis on Thursday. Spo2 in the 80s. Uses 4LPM [place] at home. Aspirin 324mg  given by EMS.

## 2023-01-21 NOTE — ED Provider Notes (Signed)
[place] EMERGENCY DEPARTMENT AT [facility] Provider Note   HPI: Grzegorz Hamor is a 37 year old male with a past medical history as below presenting today with shortness of breath.  He reports a history of ESRD on dialysis Tuesday, Thursday, Saturday.  He reports he was last dialyzed on Tuesday and missed yesterday as he has been feeling ill.  He endorses a cough productive of sputum and has had fevers and chills.  He is also endorsing chest pain.  Due to worsening of his symptoms he called EMS.  EMS report the patient was awake and alert and they placed him on a nonrebreather due to hypoxia.  The patient typically wears 4 L nasal cannula at home.  EMS gave 324 mg of chewable aspirin prior to arrival.  They report his oxygen was in the 80s on room air.  Past Medical History:  Diagnosis Date   Acute on chronic respiratory failure with hypoxia ([name]) 04/21/2021   Acute on chronic systolic (congestive) heart failure ([name]) 02/26/2020   Biventricular congestive heart failure ([name])    Last Echo 11/2019 at [facility] reveals EF 20%   Class 3 severe obesity due to excess calories with serious comorbidity and body mass index (BMI) of 50.0 to 59.9 in adult [facility]) 02/26/2020   Essential hypertension 02/26/2020   GERD without esophagitis 02/26/2020   Hidradenitis suppurativa 02/26/2020   OSA (obstructive sleep apnea) 02/26/2020   Pneumonia    Pre-diabetes    Prediabetes 02/26/2020   Renal disorder     Past Surgical History:  Procedure Laterality Date   ABSCESS DRAINAGE     AV FISTULA PLACEMENT Left 08/21/2021   Procedure: LEFT ARM ARTERIOVENOUS (AV) FISTULA.;  Surgeon: Nada Libman, MD;  Location: [facility] OR;  Service: Vascular;  Laterality: Left;   CARDIAC CATHETERIZATION     CARDIOVERSION N/A 10/09/2021   Procedure: CARDIOVERSION;  Surgeon: Laurey Morale, MD;  Location: [facility] ENDOSCOPY;  Service: Cardiovascular;  Laterality: N/A;   CARDIOVERSION N/A 05/28/2022   Procedure:  CARDIOVERSION;  Surgeon: Laurey Morale, MD;  Location: [facility] ENDOSCOPY;  Service: Cardiovascular;  Laterality: N/A;   CARDIOVERSION N/A 06/07/2022   Procedure: CARDIOVERSION;  Surgeon: Laurey Morale, MD;  Location: [facility] ENDOSCOPY;  Service: Cardiovascular;  Laterality: N/A;   IR FLUORO GUIDE CV LINE RIGHT  03/10/2021   IR FLUORO GUIDE CV LINE RIGHT  04/22/2021   IR FLUORO GUIDE CV LINE RIGHT  08/20/2021   IR US GUIDE VASC ACCESS RIGHT  03/10/2021   IR US GUIDE VASC ACCESS RIGHT  04/22/2021   RIGHT HEART CATH N/A 03/06/2021   Procedure: RIGHT HEART CATH;  Surgeon: Dolores Patty, MD;  Location: [facility] INVASIVE CV LAB;  Service: Cardiovascular;  Laterality: N/A;   RIGHT/LEFT HEART CATH AND CORONARY ANGIOGRAPHY N/A 03/04/2020   Procedure: RIGHT/LEFT HEART CATH AND CORONARY ANGIOGRAPHY;  Surgeon: Laurey Morale, MD;  Location: [facility] INVASIVE CV LAB;  Service: Cardiovascular;  Laterality: N/A;   TEE WITHOUT CARDIOVERSION N/A 05/05/2021   Procedure: TRANSESOPHAGEAL ECHOCARDIOGRAM (TEE);  Surgeon: Laurey Morale, MD;  Location: [facility] ENDOSCOPY;  Service: Cardiovascular;  Laterality: N/A;   TEMPORARY DIALYSIS CATHETER  03/06/2021   Procedure: TEMPORARY DIALYSIS CATHETER;  Surgeon: Dolores Patty, MD;  Location: [facility] INVASIVE CV LAB;  Service: Cardiovascular;;     Social History   Tobacco Use   Smoking status: Former    Packs/day: 1    Types: Cigarettes    Quit date: 2019    Years  since quitting: 5.4    Passive exposure: Current   Smokeless tobacco: Former   Tobacco comments:    quit in 2019  Vaping Use   Vaping Use: Never used  Substance Use Topics   Alcohol use: Never   Drug use: Never      Review of Systems  A complete ROS was performed with pertinent positives/negatives noted in the HPI.   Vitals:   01/21/23 1740 01/21/23 1741  BP: 113/60   Pulse: (!) 155   Resp: (!) 31   Temp:  (!) 102 F (38.9 C)  SpO2: 100%     Physical Exam Vitals and nursing note reviewed.   Constitutional:      General: He is in acute distress.     Appearance: He is well-developed. He is ill-appearing.  Cardiovascular:     Rate and Rhythm: Tachycardia present. Rhythm irregular.     Pulses: Normal pulses.     Heart sounds: Normal heart sounds. No murmur heard.    No friction rub. No gallop.  Pulmonary:     Effort: Tachypnea and respiratory distress present.     Breath sounds: No stridor. Rhonchi and rales present. No wheezing.  Abdominal:     General: Abdomen is flat. There is no distension.  Musculoskeletal:        General: No swelling.  Skin:    General: Skin is warm and dry.     Capillary Refill: Capillary refill takes less than 2 seconds.  Neurological:     Mental Status: He is alert.     Procedures  MDM:  Imaging/radiology results:  No results found.  EKG results: ECG on my interpretation is atrial fibrillation with RVR rate of 162, without anatomical ischemia representing STEMI or ischemic equivalent.      Lab results: *** No results found for this or any previous visit (from the past 24 hour(s)).   Key medications administered in the ER: *** Medications - No data to display  Consults: ***    Click here for ABCD2, HEART and other calculatorsREFRESH Note before signing   Medical decision making: -Vital signs show tachycardia with stable blood pressure.  Patient is febrile.  He is ill-appearing.  On arrival patient started on BiPAP. -Patient's presentation is most consistent with acute presentation with potential threat to life or bodily function.. Khalifa Eull is a 37 y.o. male presenting to the emergency department with shortness of breath.  -Additional history obtained from EMS -Per chart review, patient has had paroxysmal A-fib and is anticoagulated. -Initial troponin ***. Second troponin ***. Delta troponin ***. EKG I obtained reveals no anatomical ischemia representing STEMI, new onset arrythmia, or ischemic equivalent***. Therefore do  not suspect ACS at this time. CXR un***remarkable for focal airspace disease, patient is afebrile, no cough, do not suspect Pneumonia. CXR without evidence of Pneumothorax. No concerns for Pericardial Tamponade on EKG and in light of patients hemodynamic stability doubt this pathology. No pain related to supine or prone positions and given EKG doubt Pericarditis. Unlikely myocarditis, does not fit clinical picture, no EKG findings to support. Unlikely Pulmonary Embolism as PERC ***. well's score, low probability***. Therefore will not obtain CTA Chest or D-dimer. No lower extremity edema, CXR without pulmonary edema, BNP ***, doubt CHF exacerbation.     Medical Decision Making Amount and/or Complexity of Data Reviewed Labs: ordered. Radiology: ordered.  Risk Prescription drug management. Decision regarding hospitalization.     The plan for this patient was discussed with Dr. ***, who voiced  agreement and who oversaw evaluation and treatment of this patient.  Marta Lamas, MD Emergency Medicine, PGY-3  Note: Dragon medical dictation software was used in the creation of this note.   Clinical Impression: No diagnosis found.  Rx / DC Orders ED Discharge Orders     None

## 2023-01-21 NOTE — ED Notes (Signed)
Placed on bipap at this time. Pt tolerating well.

## 2023-01-22 ENCOUNTER — Other Ambulatory Visit: Payer: Self-pay

## 2023-01-22 ENCOUNTER — Inpatient Hospital Stay (HOSPITAL_COMMUNITY): Payer: BC Managed Care – PPO

## 2023-01-22 ENCOUNTER — Other Ambulatory Visit (HOSPITAL_COMMUNITY): Payer: BC Managed Care – PPO

## 2023-01-22 ENCOUNTER — Encounter (HOSPITAL_COMMUNITY): Payer: Self-pay | Admitting: Internal Medicine

## 2023-01-22 DIAGNOSIS — R6520 Severe sepsis without septic shock: Secondary | ICD-10-CM

## 2023-01-22 DIAGNOSIS — I5023 Acute on chronic systolic (congestive) heart failure: Secondary | ICD-10-CM

## 2023-01-22 DIAGNOSIS — J9601 Acute respiratory failure with hypoxia: Secondary | ICD-10-CM

## 2023-01-22 DIAGNOSIS — A419 Sepsis, unspecified organism: Secondary | ICD-10-CM | POA: Diagnosis not present

## 2023-01-22 DIAGNOSIS — Z992 Dependence on renal dialysis: Secondary | ICD-10-CM

## 2023-01-22 DIAGNOSIS — N186 End stage renal disease: Secondary | ICD-10-CM | POA: Diagnosis not present

## 2023-01-22 DIAGNOSIS — I4819 Other persistent atrial fibrillation: Secondary | ICD-10-CM

## 2023-01-22 LAB — BLOOD CULTURE ID PANEL (REFLEXED) - BCID2
A.calcoaceticus-baumannii: NOT DETECTED
Bacteroides fragilis: NOT DETECTED
Candida albicans: NOT DETECTED
Candida auris: NOT DETECTED
Candida glabrata: NOT DETECTED
Candida krusei: NOT DETECTED
Candida parapsilosis: NOT DETECTED
Candida tropicalis: NOT DETECTED
Cryptococcus neoformans/gattii: NOT DETECTED
Enterobacter cloacae complex: NOT DETECTED
Enterobacterales: NOT DETECTED
Enterococcus Faecium: NOT DETECTED
Enterococcus faecalis: NOT DETECTED
Escherichia coli: NOT DETECTED
Haemophilus influenzae: NOT DETECTED
Klebsiella aerogenes: NOT DETECTED
Klebsiella oxytoca: NOT DETECTED
Klebsiella pneumoniae: NOT DETECTED
Listeria monocytogenes: NOT DETECTED
Neisseria meningitidis: NOT DETECTED
Proteus species: NOT DETECTED
Pseudomonas aeruginosa: NOT DETECTED
Salmonella species: NOT DETECTED
Serratia marcescens: NOT DETECTED
Staphylococcus aureus (BCID): NOT DETECTED
Staphylococcus epidermidis: NOT DETECTED
Staphylococcus lugdunensis: NOT DETECTED
Staphylococcus species: NOT DETECTED
Stenotrophomonas maltophilia: NOT DETECTED
Streptococcus agalactiae: NOT DETECTED
Streptococcus pneumoniae: DETECTED — AB
Streptococcus pyogenes: NOT DETECTED
Streptococcus species: DETECTED — AB

## 2023-01-22 LAB — RENAL FUNCTION PANEL
Albumin: 3.4 g/dL — ABNORMAL LOW (ref 3.5–5.0)
Albumin: 3.3 g/dL — ABNORMAL LOW (ref 3.5–5.0)
Anion gap: 16 — ABNORMAL HIGH (ref 5–15)
Anion gap: 18 — ABNORMAL HIGH (ref 5–15)
BUN: 51 mg/dL — ABNORMAL HIGH (ref 6–20)
BUN: 55 mg/dL — ABNORMAL HIGH (ref 6–20)
CO2: 20 mmol/L — ABNORMAL LOW (ref 22–32)
CO2: 20 mmol/L — ABNORMAL LOW (ref 22–32)
Calcium: 8.8 mg/dL — ABNORMAL LOW (ref 8.9–10.3)
Calcium: 8.6 mg/dL — ABNORMAL LOW (ref 8.9–10.3)
Chloride: 95 mmol/L — ABNORMAL LOW (ref 98–111)
Chloride: 93 mmol/L — ABNORMAL LOW (ref 98–111)
Creatinine, Ser: 10.01 mg/dL — ABNORMAL HIGH (ref 0.61–1.24)
Creatinine, Ser: 9.44 mg/dL — ABNORMAL HIGH (ref 0.61–1.24)
GFR, Estimated: 6 mL/min — ABNORMAL LOW (ref >60)
GFR, Estimated: 7 mL/min — ABNORMAL LOW (ref >60)
Glucose, Bld: 141 mg/dL — ABNORMAL HIGH (ref 70–99)
Glucose, Bld: 113 mg/dL — ABNORMAL HIGH (ref 70–99)
Phosphorus: 5.7 mg/dL — ABNORMAL HIGH (ref 2.5–4.6)
Phosphorus: 5.7 mg/dL — ABNORMAL HIGH (ref 2.5–4.6)
Potassium: 6.1 mmol/L — ABNORMAL HIGH (ref 3.5–5.1)
Potassium: 6 mmol/L — ABNORMAL HIGH (ref 3.5–5.1)
Sodium: 131 mmol/L — ABNORMAL LOW (ref 135–145)
Sodium: 131 mmol/L — ABNORMAL LOW (ref 135–145)

## 2023-01-22 LAB — MRSA NEXT GEN BY PCR, NASAL: MRSA by PCR Next Gen: NOT DETECTED

## 2023-01-22 LAB — BLOOD GAS, VENOUS
Acid-base deficit: 4.2 mmol/L — ABNORMAL HIGH (ref 0.0–2.0)
Bicarbonate: 20.1 mmol/L (ref 20.0–28.0)
O2 Saturation: 99.5 %
Patient temperature: 36.8
pCO2, Ven: 34 mmHg — ABNORMAL LOW (ref 44–60)
pH, Ven: 7.38 (ref 7.25–7.43)
pO2, Ven: 141 mmHg — ABNORMAL HIGH (ref 32–45)

## 2023-01-22 LAB — URINALYSIS, W/ REFLEX TO CULTURE (INFECTION SUSPECTED)
Bilirubin Urine: NEGATIVE
Glucose, UA: NEGATIVE mg/dL
Ketones, ur: NEGATIVE mg/dL
Nitrite: NEGATIVE
Protein, ur: 100 mg/dL — AB
RBC / HPF: >50 RBC/hpf (ref 0–5)
Specific Gravity, Urine: 1.013 (ref 1.005–1.030)
pH: 5 (ref 5.0–8.0)

## 2023-01-22 LAB — CBC
HCT: 31.8 % — ABNORMAL LOW (ref 39.0–52.0)
Hemoglobin: 10.4 g/dL — ABNORMAL LOW (ref 13.0–17.0)
MCH: 32.1 pg (ref 26.0–34.0)
MCHC: 32.7 g/dL (ref 30.0–36.0)
MCV: 98.1 fL (ref 80.0–100.0)
Platelets: 196 10*3/uL (ref 150–400)
RBC: 3.24 MIL/uL — ABNORMAL LOW (ref 4.22–5.81)
RDW: 16.6 % — ABNORMAL HIGH (ref 11.5–15.5)
WBC: 35.9 10*3/uL — ABNORMAL HIGH (ref 4.0–10.5)
nRBC: 0 % (ref 0.0–0.2)

## 2023-01-22 LAB — CULTURE, BLOOD (ROUTINE X 2): Special Requests: ADEQUATE

## 2023-01-22 LAB — POCT I-STAT 7, (LYTES, BLD GAS, ICA,H+H)
Acid-base deficit: 4 mmol/L — ABNORMAL HIGH (ref 0.0–2.0)
Bicarbonate: 21.5 mmol/L (ref 20.0–28.0)
Calcium, Ion: 1.07 mmol/L — ABNORMAL LOW (ref 1.15–1.40)
HCT: 34 % — ABNORMAL LOW (ref 39.0–52.0)
Hemoglobin: 11.6 g/dL — ABNORMAL LOW (ref 13.0–17.0)
O2 Saturation: 98 %
Patient temperature: 36.6
Potassium: 5.5 mmol/L — ABNORMAL HIGH (ref 3.5–5.1)
Sodium: 130 mmol/L — ABNORMAL LOW (ref 135–145)
TCO2: 23 mmol/L (ref 22–32)
pCO2 arterial: 39.8 mmHg (ref 32–48)
pH, Arterial: 7.339 — ABNORMAL LOW (ref 7.35–7.45)
pO2, Arterial: 113 mmHg — ABNORMAL HIGH (ref 83–108)

## 2023-01-22 LAB — RESP PANEL BY RT-PCR (RSV, FLU A&B, COVID)  RVPGX2
Influenza A by PCR: NEGATIVE
Influenza B by PCR: NEGATIVE
Resp Syncytial Virus by PCR: NEGATIVE
SARS Coronavirus 2 by RT PCR: NEGATIVE

## 2023-01-22 LAB — PROCALCITONIN: Procalcitonin: >150 ng/mL

## 2023-01-22 LAB — GLUCOSE, CAPILLARY
Glucose-Capillary: 115 mg/dL — ABNORMAL HIGH (ref 70–99)
Glucose-Capillary: 107 mg/dL — ABNORMAL HIGH (ref 70–99)

## 2023-01-22 MED ORDER — PRISMASOL BGK 0/2.5 32-2.5 MEQ/L EC SOLN
Status: DC
  Filled 2023-01-22 (×11): qty 5000

## 2023-01-22 MED ORDER — VANCOMYCIN HCL 2000 MG/400ML IV SOLN
2000.0000 mg | INTRAVENOUS | Status: DC
  Administered 2023-01-23: 2000 mg via INTRAVENOUS
  Filled 2023-01-22 (×2): qty 400

## 2023-01-22 MED ORDER — KETAMINE HCL 50 MG/5ML IJ SOSY
PREFILLED_SYRINGE | INTRAMUSCULAR | Status: AC
  Administered 2023-01-22: 50 mg
  Filled 2023-01-22: qty 5

## 2023-01-22 MED ORDER — PIPERACILLIN-TAZOBACTAM IN DEX 2-0.25 GM/50ML IV SOLN
2.2500 g | Freq: Three times a day (TID) | INTRAVENOUS | Status: DC
  Filled 2023-01-22: qty 50

## 2023-01-22 MED ORDER — PROPOFOL 1000 MG/100ML IV EMUL
5.0000 ug/kg/min | INTRAVENOUS | Status: DC
  Administered 2023-01-22: 50 ug/kg/min via INTRAVENOUS
  Administered 2023-01-22 (×4): 60 ug/kg/min via INTRAVENOUS
  Administered 2023-01-23: 50 ug/kg/min via INTRAVENOUS
  Administered 2023-01-23: 30 ug/kg/min via INTRAVENOUS
  Administered 2023-01-23: 25 ug/kg/min via INTRAVENOUS
  Administered 2023-01-23: 30 ug/kg/min via INTRAVENOUS
  Administered 2023-01-23 (×2): 40 ug/kg/min via INTRAVENOUS
  Administered 2023-01-23 – 2023-01-24 (×8): 30 ug/kg/min via INTRAVENOUS
  Administered 2023-01-24: 25 ug/kg/min via INTRAVENOUS
  Administered 2023-01-24 (×2): 30 ug/kg/min via INTRAVENOUS
  Administered 2023-01-25: 25 ug/kg/min via INTRAVENOUS
  Administered 2023-01-25 (×2): 40 ug/kg/min via INTRAVENOUS
  Administered 2023-01-25: 25 ug/kg/min via INTRAVENOUS
  Administered 2023-01-25 – 2023-01-26 (×15): 40 ug/kg/min via INTRAVENOUS
  Administered 2023-01-26: 30 ug/kg/min via INTRAVENOUS
  Administered 2023-01-26: 40 ug/kg/min via INTRAVENOUS
  Administered 2023-01-27: 35 ug/kg/min via INTRAVENOUS
  Administered 2023-01-27 (×2): 30 ug/kg/min via INTRAVENOUS
  Administered 2023-01-27: 20 ug/kg/min via INTRAVENOUS
  Administered 2023-01-27: 30 ug/kg/min via INTRAVENOUS
  Administered 2023-01-27: 40 ug/kg/min via INTRAVENOUS
  Filled 2023-01-22 (×2): qty 200
  Filled 2023-01-22 (×2): qty 100
  Filled 2023-01-22 (×2): qty 200
  Filled 2023-01-22 (×6): qty 100
  Filled 2023-01-22: qty 200
  Filled 2023-01-22: qty 100
  Filled 2023-01-22 (×3): qty 200
  Filled 2023-01-22 (×8): qty 100
  Filled 2023-01-22 (×3): qty 200
  Filled 2023-01-22 (×2): qty 100
  Filled 2023-01-22 (×3): qty 200
  Filled 2023-01-22: qty 100

## 2023-01-22 MED ORDER — PRISMASOL BGK 0/2.5 32-2.5 MEQ/L EC SOLN
Status: DC
  Filled 2023-01-22 (×9): qty 5000

## 2023-01-22 MED ORDER — HYDROMORPHONE HCL 1 MG/ML IJ SOLN
0.5000 mg | Freq: Once | INTRAMUSCULAR | Status: AC | PRN
  Administered 2023-01-22: 0.5 mg via INTRAVENOUS
  Filled 2023-01-22: qty 0.5

## 2023-01-22 MED ORDER — NOREPINEPHRINE 4 MG/250ML-% IV SOLN
0.0000 ug/min | INTRAVENOUS | Status: DC
  Filled 2023-01-22: qty 250

## 2023-01-22 MED ORDER — HALOPERIDOL LACTATE 5 MG/ML IJ SOLN
INTRAMUSCULAR | Status: AC
  Administered 2023-01-22: 5 mg
  Filled 2023-01-22: qty 1

## 2023-01-22 MED ORDER — ACETAMINOPHEN 10 MG/ML IV SOLN: 1000.0000 mg | Freq: Four times a day (QID) | INTRAVENOUS | Status: DC

## 2023-01-22 MED ORDER — ACETAMINOPHEN 325 MG PO TABS
650.0000 mg | ORAL_TABLET | Freq: Four times a day (QID) | ORAL | Status: DC | PRN
  Filled 2023-01-22: qty 2

## 2023-01-22 MED ORDER — FENTANYL CITRATE PF 50 MCG/ML IJ SOSY
PREFILLED_SYRINGE | INTRAMUSCULAR | Status: AC
  Filled 2023-01-22: qty 2

## 2023-01-22 MED ORDER — APIXABAN 5 MG PO TABS
5.0000 mg | ORAL_TABLET | Freq: Two times a day (BID) | ORAL | Status: DC
  Administered 2023-01-22 – 2023-02-28 (×74): 5 mg
  Filled 2023-01-22 (×77): qty 1

## 2023-01-22 MED ORDER — ORAL CARE MOUTH RINSE
15.0000 mL | OROMUCOSAL | Status: DC
  Administered 2023-01-22 – 2023-02-12 (×251): 15 mL via OROMUCOSAL

## 2023-01-22 MED ORDER — SODIUM CHLORIDE 0.9 % IV SOLN: INTRAVENOUS | Status: DC | PRN

## 2023-01-22 MED ORDER — PIPERACILLIN-TAZOBACTAM 3.375 G IVPB
3.3750 g | Freq: Four times a day (QID) | INTRAVENOUS | Status: DC
  Administered 2023-01-23 (×2): 3.375 g via INTRAVENOUS
  Filled 2023-01-22 (×4): qty 50

## 2023-01-22 MED ORDER — ONDANSETRON HCL 4 MG/2ML IJ SOLN
4.0000 mg | Freq: Four times a day (QID) | INTRAMUSCULAR | Status: DC | PRN
  Administered 2023-01-22 – 2023-02-27 (×3): 4 mg via INTRAVENOUS
  Filled 2023-01-22 (×3): qty 2

## 2023-01-22 MED ORDER — KETAMINE HCL 50 MG/5ML IJ SOSY
PREFILLED_SYRINGE | INTRAMUSCULAR | Status: AC
  Filled 2023-01-22: qty 10

## 2023-01-22 MED ORDER — CALCIUM GLUCONATE-NACL 1-0.675 GM/50ML-% IV SOLN
1.0000 g | Freq: Once | INTRAVENOUS | Status: AC
  Administered 2023-01-22: 1000 mg via INTRAVENOUS
  Filled 2023-01-22: qty 50

## 2023-01-22 MED ORDER — FENTANYL BOLUS VIA INFUSION
50.0000 ug | INTRAVENOUS | Status: DC | PRN
  Administered 2023-01-22 – 2023-01-27 (×13): 50 ug via INTRAVENOUS

## 2023-01-22 MED ORDER — ETOMIDATE 2 MG/ML IV SOLN
INTRAVENOUS | Status: AC
  Administered 2023-01-22: 20 mg via INTRAVENOUS
  Filled 2023-01-22: qty 20

## 2023-01-22 MED ORDER — VANCOMYCIN HCL 10 G IV SOLR
2500.0000 mg | Freq: Once | INTRAVENOUS | Status: AC
  Administered 2023-01-22: 2500 mg via INTRAVENOUS
  Filled 2023-01-22: qty 25

## 2023-01-22 MED ORDER — HALOPERIDOL LACTATE 5 MG/ML IJ SOLN: 5.0000 mg | Freq: Four times a day (QID) | INTRAMUSCULAR | Status: DC | PRN

## 2023-01-22 MED ORDER — PIPERACILLIN-TAZOBACTAM 3.375 G IVPB 30 MIN
3.3750 g | Freq: Four times a day (QID) | INTRAVENOUS | Status: DC
  Administered 2023-01-22: 3.375 g via INTRAVENOUS
  Filled 2023-01-22 (×2): qty 50

## 2023-01-22 MED ORDER — KETAMINE HCL 50 MG/5ML IJ SOSY: 100.0000 mg | PREFILLED_SYRINGE | Freq: Once | INTRAMUSCULAR | Status: DC

## 2023-01-22 MED ORDER — MIDAZOLAM HCL 2 MG/2ML IJ SOLN: 2.0000 mg | Freq: Once | INTRAMUSCULAR | Status: DC

## 2023-01-22 MED ORDER — POLYETHYLENE GLYCOL 3350 17 G PO PACK: 17.0000 g | PACK | Freq: Every day | ORAL | Status: DC | PRN

## 2023-01-22 MED ORDER — LIDOCAINE 5 % EX PTCH
1.0000 | MEDICATED_PATCH | CUTANEOUS | Status: DC
  Administered 2023-01-25 – 2023-03-16 (×34): 1 via TRANSDERMAL
  Filled 2023-01-22 (×47): qty 1

## 2023-01-22 MED ORDER — FENTANYL CITRATE PF 50 MCG/ML IJ SOSY
100.0000 ug | PREFILLED_SYRINGE | Freq: Once | INTRAMUSCULAR | Status: AC
  Administered 2023-01-22: 100 ug via INTRAVENOUS

## 2023-01-22 MED ORDER — ACETAMINOPHEN 650 MG RE SUPP: 650.0000 mg | Freq: Four times a day (QID) | RECTAL | Status: DC | PRN

## 2023-01-22 MED ORDER — FENTANYL 2500MCG IN NS 250ML (10MCG/ML) PREMIX INFUSION
0.0000 ug/h | INTRAVENOUS | Status: DC
  Administered 2023-01-22: 300 ug/h via INTRAVENOUS
  Administered 2023-01-22 – 2023-01-23 (×3): 200 ug/h via INTRAVENOUS
  Administered 2023-01-24: 175 ug/h via INTRAVENOUS
  Administered 2023-01-24: 225 ug/h via INTRAVENOUS
  Administered 2023-01-25 (×2): 200 ug/h via INTRAVENOUS
  Administered 2023-01-26 (×2): 250 ug/h via INTRAVENOUS
  Administered 2023-01-27: 75 ug/h via INTRAVENOUS
  Filled 2023-01-22 (×12): qty 250

## 2023-01-22 MED ORDER — ETOMIDATE 2 MG/ML IV SOLN: 20.0000 mg | Freq: Once | INTRAVENOUS | Status: AC

## 2023-01-22 MED ORDER — MELATONIN 5 MG PO TABS
5.0000 mg | ORAL_TABLET | Freq: Every day | ORAL | Status: DC
  Administered 2023-01-27 – 2023-03-03 (×31): 5 mg
  Filled 2023-01-22 (×32): qty 1

## 2023-01-22 MED ORDER — NOREPINEPHRINE 16 MG/250ML-% IV SOLN
0.0000 ug/min | INTRAVENOUS | Status: DC
  Administered 2023-01-22: 5 ug/min via INTRAVENOUS
  Administered 2023-01-25: 7 ug/min via INTRAVENOUS
  Administered 2023-01-26: 4 ug/min via INTRAVENOUS
  Administered 2023-01-27: 19 ug/min via INTRAVENOUS
  Administered 2023-01-28: 15 ug/min via INTRAVENOUS
  Administered 2023-01-29: 12 ug/min via INTRAVENOUS
  Administered 2023-01-29: 18 ug/min via INTRAVENOUS
  Administered 2023-01-30: 28 ug/min via INTRAVENOUS
  Administered 2023-01-30: 29.973 ug/min via INTRAVENOUS
  Administered 2023-01-30: 35 ug/min via INTRAVENOUS
  Administered 2023-01-31 (×2): 24 ug/min via INTRAVENOUS
  Administered 2023-02-01: 30 ug/min via INTRAVENOUS
  Administered 2023-02-01: 28 ug/min via INTRAVENOUS
  Administered 2023-02-02: 26 ug/min via INTRAVENOUS
  Administered 2023-02-03 (×2): 18 ug/min via INTRAVENOUS
  Administered 2023-02-04: 28 ug/min via INTRAVENOUS
  Administered 2023-02-04: 18 ug/min via INTRAVENOUS
  Administered 2023-02-05: 35 ug/min via INTRAVENOUS
  Administered 2023-02-05: 34 ug/min via INTRAVENOUS
  Administered 2023-02-05 – 2023-02-06 (×2): 30 ug/min via INTRAVENOUS
  Administered 2023-02-06: 34 ug/min via INTRAVENOUS
  Administered 2023-02-06: 32 ug/min via INTRAVENOUS
  Administered 2023-02-07: 27 ug/min via INTRAVENOUS
  Administered 2023-02-07: 30 ug/min via INTRAVENOUS
  Administered 2023-02-07: 31.04 ug/min via INTRAVENOUS
  Administered 2023-02-08: 26.987 ug/min via INTRAVENOUS
  Administered 2023-02-08: 23.04 ug/min via INTRAVENOUS
  Administered 2023-02-09: 18 ug/min via INTRAVENOUS
  Administered 2023-02-09: 23 ug/min via INTRAVENOUS
  Administered 2023-02-10: 18 ug/min via INTRAVENOUS
  Administered 2023-02-11 (×2): 16 ug/min via INTRAVENOUS
  Administered 2023-02-12: 19 ug/min via INTRAVENOUS
  Administered 2023-02-13: 17 ug/min via INTRAVENOUS
  Administered 2023-02-13: 20 ug/min via INTRAVENOUS
  Administered 2023-02-14: 27 ug/min via INTRAVENOUS
  Administered 2023-02-14: 30 ug/min via INTRAVENOUS
  Administered 2023-02-14: 40 ug/min via INTRAVENOUS
  Administered 2023-02-14: 27 ug/min via INTRAVENOUS
  Administered 2023-02-15: 30 ug/min via INTRAVENOUS
  Administered 2023-02-15 (×2): 40 ug/min via INTRAVENOUS
  Administered 2023-02-16: 38 ug/min via INTRAVENOUS
  Administered 2023-02-16: 35 ug/min via INTRAVENOUS
  Administered 2023-02-16: 36 ug/min via INTRAVENOUS
  Administered 2023-02-16: 40 ug/min via INTRAVENOUS
  Administered 2023-02-17: 50 ug/min via INTRAVENOUS
  Administered 2023-02-17: 46 ug/min via INTRAVENOUS
  Administered 2023-02-17: 37 ug/min via INTRAVENOUS
  Administered 2023-02-17: 50 ug/min via INTRAVENOUS
  Administered 2023-02-18: 30 ug/min via INTRAVENOUS
  Administered 2023-02-18: 28 ug/min via INTRAVENOUS
  Administered 2023-02-18: 20 ug/min via INTRAVENOUS
  Administered 2023-02-19 (×3): 28 ug/min via INTRAVENOUS
  Administered 2023-02-20: 29 ug/min via INTRAVENOUS
  Administered 2023-02-20: 28 ug/min via INTRAVENOUS
  Administered 2023-02-21: 26 ug/min via INTRAVENOUS
  Administered 2023-02-21: 5 ug/min via INTRAVENOUS
  Administered 2023-02-22: 28 ug/min via INTRAVENOUS
  Administered 2023-02-22 (×2): 24 ug/min via INTRAVENOUS
  Administered 2023-02-23: 22 ug/min via INTRAVENOUS
  Administered 2023-02-23: 24 ug/min via INTRAVENOUS
  Administered 2023-02-24 (×2): 23 ug/min via INTRAVENOUS
  Administered 2023-02-25: 20 ug/min via INTRAVENOUS
  Administered 2023-02-25: 40 ug/min via INTRAVENOUS
  Administered 2023-02-25: 20 ug/min via INTRAVENOUS
  Administered 2023-02-25 – 2023-02-26 (×4): 40 ug/min via INTRAVENOUS
  Administered 2023-02-27: 15 ug/min via INTRAVENOUS
  Administered 2023-02-27: 40 ug/min via INTRAVENOUS
  Administered 2023-02-28: 2 ug/min via INTRAVENOUS
  Filled 2023-01-22 (×25): qty 250
  Filled 2023-01-22: qty 500
  Filled 2023-01-22 (×43): qty 250
  Filled 2023-01-22: qty 500
  Filled 2023-01-22 (×8): qty 250

## 2023-01-22 MED ORDER — MIDAZOLAM HCL 2 MG/2ML IJ SOLN
INTRAMUSCULAR | Status: AC
  Filled 2023-01-22: qty 2

## 2023-01-22 MED ORDER — KETAMINE HCL-SODIUM CHLORIDE 1000-0.9 MG/100ML-% IV SOLN
0.5000 mg/kg/h | INTRAVENOUS | Status: DC
  Administered 2023-01-22: 0.5 mg/kg/h via INTRAVENOUS
  Filled 2023-01-22: qty 100

## 2023-01-22 MED ORDER — DEXMEDETOMIDINE HCL IN NACL 400 MCG/100ML IV SOLN
0.0000 ug/kg/h | INTRAVENOUS | Status: DC
  Administered 2023-01-22: 0.4 ug/kg/h via INTRAVENOUS
  Administered 2023-01-22: 1.2 ug/kg/h via INTRAVENOUS
  Filled 2023-01-22 (×5): qty 100

## 2023-01-22 MED ORDER — ROCURONIUM BROMIDE 10 MG/ML (PF) SYRINGE: 100.0000 mg | PREFILLED_SYRINGE | Freq: Once | INTRAVENOUS | Status: AC

## 2023-01-22 MED ORDER — PRISMASOL BGK 0/2.5 32-2.5 MEQ/L EC SOLN
Status: DC
  Filled 2023-01-22 (×34): qty 5000

## 2023-01-22 MED ORDER — ROCURONIUM BROMIDE 10 MG/ML (PF) SYRINGE
PREFILLED_SYRINGE | INTRAVENOUS | Status: AC
  Administered 2023-01-22: 100 mg via INTRAVENOUS
  Filled 2023-01-22: qty 10

## 2023-01-22 MED ORDER — ORAL CARE MOUTH RINSE: 15.0000 mL | OROMUCOSAL | Status: DC | PRN

## 2023-01-22 MED ORDER — LIDOCAINE HCL (PF) 1 % IJ SOLN
INTRAMUSCULAR | Status: AC
  Administered 2023-01-22: 5 mL via INTRADERMAL
  Filled 2023-01-22: qty 5

## 2023-01-22 NOTE — Progress Notes (Signed)
HD#1 Subjective:  Patient is seen and bedside this morning.  He appears uncomfortable with tachypnea.  He is alert and awake and able to answer question appropriately.  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  He denies any change in his cough or sputum production but may have greenish sputum starting yesterday.  He report red-colored urine a few days ago without dysuria.  His main reason that he called the ambulance was shortness of breath and left-sided chest pain.  He did not have chest pain like this before even with tachycardia.  Objective:  Vital signs in last 24 hours: Vitals:   01/22/23 0201 01/22/23 0309 01/22/23 0502 01/22/23 0714  BP:  103/83    Pulse:  68  (!) 152  Resp: (!) 46 20  (!) 24  Temp:  98.2 F (36.8 C)    TempSrc:  Oral    SpO2:  100%  96%  Weight:   (!) 194.5 kg   Height:       Supplemental O2: HFNC SpO2: 96 % O2 Flow Rate (L/min): 13 L/min FiO2 (%): 60 % (Titrated to 60%, sats 96%.)   Physical Exam:  Physical Exam Constitutional:      General: He is in acute distress.     Appearance: He is ill-appearing.  HENT:     Head: Normocephalic.  Eyes:     General:        Right eye: No discharge.        Left eye: No discharge.     Conjunctiva/sclera: Conjunctivae normal.  Cardiovascular:     Rate and Rhythm: Tachycardia present. Rhythm irregular.     Pulses: Normal pulses.     Comments: No lower extremity edema Pulmonary:     Effort: Respiratory distress present.     Comments: No obvious wheezing.  Rales heard in the left lower lung base.  Accessory muscle use Abdominal:     General: Bowel sounds are normal. There is no distension.     Palpations: Abdomen is soft.     Comments: No CVA tenderness  Musculoskeletal:     Comments: Normal pulses palpated in all extremities.  All extremities are warm to touch  Neurological:     Mental Status: Mental status is at baseline.  Psychiatric:        Mood and  Affect: Mood normal.     Filed Weights   01/21/23 2352 01/22/23 0502  Weight: (!) 182.1 kg (!) 194.5 kg     Intake/Output Summary (Last 24 hours) at 01/22/2023 0812 Last data filed at 01/22/2023 0350 Gross per 24 hour  Intake 500 ml  Output 4110 ml  Net -3610 ml   Net IO Since Admission: -3,610 mL [01/22/23 0812]  Pertinent Labs:    Latest Ref Rng & Units 01/22/2023    6:18 AM 01/21/2023    6:42 PM 01/21/2023    5:49 PM  CBC  WBC 4.0 - 10.5 K/uL 35.9  21.6    Hemoglobin 13.0 - 17.0 g/dL 45.4  09.8  11.9   Hematocrit 39.0 - 52.0 % 31.8  31.9  35.0   Platelets 150 - 400 K/uL 196  208         Latest Ref Rng & Units 01/22/2023    6:18 AM 01/21/2023    6:42 PM 01/21/2023    5:49 PM  CMP  Glucose 70 - 99 mg/dL 147  78  85   BUN 6 - 20 mg/dL  51  72  78   Creatinine 0.61 - 1.24 mg/dL 16.10  96.04  54.09   Sodium 135 - 145 mmol/L 131  130  132   Potassium 3.5 - 5.1 mmol/L 6.1  6.2  5.8   Chloride 98 - 111 mmol/L 95  97  104   CO2 22 - 32 mmol/L 20  16    Calcium 8.9 - 10.3 mg/dL 8.8  8.7      Imaging: DG Chest Port 1 View  Result Date: 01/21/2023 CLINICAL DATA:  Short of breath, chest pain EXAM: PORTABLE CHEST 1 VIEW COMPARISON:  08/05/2022 FINDINGS: Two frontal views of the chest are obtained. Evaluation is limited by portable technique and patient body habitus. Right internal jugular catheter unchanged. Cardiac silhouette is enlarged but stable. Increased central vascular congestion without airspace disease, effusion, or pneumothorax. IMPRESSION: 1. Increased central vascular congestion without overt edema. 2. Stable enlarged cardiac silhouette. Electronically Signed   By: Sharlet Salina M.D.   On: 01/21/2023 18:22    Assessment/Plan:   Principal Problem:   Acute hypoxic respiratory failure ([name]) Active Problems:   Morbid obesity ([name])   Chronic systolic CHF (congestive heart failure) ([name])   ESRD (end stage renal disease) on dialysis ([name])   Sepsis ([name])   A-fib  ([name])   Patient Summary: Phillip Young is a 37 y.o. with a pertinent PMH of ESRD on dialysis, HFrEF EF 20-25%, chronic hypoxic respiratory failure from amiodarone pulmonary toxicity, [place] city, who presented with shortness of breath and chest pain and admitted for sepsis with A-fib with RVR.   Acute on chronic hypoxic respiratory failure 2/2 Amio pul toxicity Patient is on 15 L of high flow nasal cannula on my evaluation.  He is on oxygen at home but unclear how much.  His increase in respiratory demand is secondary to possible pneumonia/sepsis in the setting of A-fib with RVR.  He does not appear hypervolemic on exam and his respiratory status was unchanged after HD yesterday.  Unfortunately his respiratory and mental status worsened that he needed BiPAP.  Patient was admitted to the ICU due to his high risk of decompensation.  He is also a very high risk intubation due to his body habitus and comorbidities. -Appreciate ICU team help with this patient  Sepsis His source of infections either pneumonia or UTI.  He was started on vancomycin and cefepime, which was switched to Zosyn for broader coverage.  Pending blood culture and respiratory culture.   Atrial fibrillation with RVR Unfortunately we do not have a good option for rate control.  Avoid beta-blocker and calcium channel blocker due to his severely reduced EF.  Avoid amiodarone due to his toxicity history.  No Tikosyn due to his structural heart disease and no digoxin due to his ESRD.  Cardiology is following.  Hopefully his heart rate will improve as we are treating his infection and optimize his volume status   Chest pain Low suspicion for ACS with flat troponin.  At this point, I have low suspicion for PE because of reported compliance to his Eliquis.  His pleuritic chest pain could be from pneumonia or his A-fib with RVR   Chronic systolic heart failure Follows with Dr. Shirlee Latch outpatient.  Unfortunately, GDMT is limited because of  kidney disease and hypotension.  Has not gotten ICD due to obesity and dialysis.  Volume management with HD   ESRD Nephrology is following.  May consider CRRT if patient is not stable enough for HD  Hematuria  No CVA tenderness or dysuria.  Questionable UTI.  Continue antibiotics and follow urine culture.  Diet: NPO IVF: None,None VTE: NOAC Code: Full  Phillip Stabler, DO 01/22/2023, 8:12 AM Pager: [phone]  Please contact the on call pager after 5 pm and on weekends at [phone].

## 2023-01-22 NOTE — Progress Notes (Signed)
Patient sent down to CT around 5:30 AM on BiPAP with this RN, RT and transport. Test is cancelled because the patient claimed he had a reaction before (difficulty of breathing and choking). Went back to floor with O2sat 100% still on BiPAP. MD notified.

## 2023-01-22 NOTE — Progress Notes (Signed)
Called by RN to reassess Bipap after an attempt off Bipap on [place] 15L with desat. Patient tolerating Bipap well at this time. Titrated FIO2 to 40%. RN notified. Patient to transport to an ICU when bed assigned.

## 2023-01-22 NOTE — Procedures (Addendum)
Central Venous Catheter Insertion Procedure Note  Estee Ertel  [id number]  1986-01-10  Date:01/22/23  Time:11:02 AM   Provider Performing:Jarryn Altland   Procedure: Insertion of Non-tunneled Central Venous [phone]) with US guidance ([id number])   Indication(s) Medication administration  Consent Risks of the procedure as well as the alternatives and risks of each were explained to the patient and/or caregiver.  Consent for the procedure was obtained and is signed in the bedside chart  Anesthesia Topical only with 1% lidocaine   Timeout Verified patient identification, verified procedure, site/side was marked, verified correct patient position, special equipment/implants available, medications/allergies/relevant history reviewed, required imaging and test results available.  Sterile Technique Maximal sterile technique including full sterile barrier drape, hand hygiene, sterile gown, sterile gloves, mask, hair covering, sterile ultrasound probe cover (if used).  Procedure Description Area of catheter insertion was cleaned with chlorhexidine and draped in sterile fashion.  With real-time ultrasound guidance a central venous catheter was placed into the left internal jugular vein. Nonpulsatile blood flow and easy flushing noted in all ports.  The catheter was sutured in place and sterile dressing applied.  Complications/Tolerance None; patient tolerated the procedure well. Chest X-ray is ordered to verify placement for internal jugular or subclavian cannulation.   Chest x-ray is not ordered for femoral cannulation.  EBL Minimal  Specimen(s) None

## 2023-01-22 NOTE — Significant Event (Addendum)
Rapid Response Event Note   Reason for Call :  Transfer to ICU  Initial Focused Assessment:  Called for assistance transferring patient to ICU 2H15. Patient A&O, appears anxious asking to take off bipap mask, eventually pulling off himself and placing [place] on; sitting up on EOB stating "I can't breathe." Bipap replaced. Lungs diminished. Heart tones fast. Skin warm, dry, edematous everywhere. Education attempted with patient regarding mask/positioning, transfer. VBG sample obtained earlier this am, see results. Many out of range labs noted.   133/101 (113) HR 140 RR 35 O2 100% Bipap 40%  Interventions/Plan of care:  Transferred 2H15 and start precedex in new PIV. MD to place CVC. Likely to start CRRT.   Event Summary:  MD Notified: Adaline Sill MD Call Time: [phone] Arrival Time: 9604 End Time: 50  Truddie Crumble, RN

## 2023-01-22 NOTE — Consult Note (Addendum)
ESRD Consult Note  Assessment/Recommendations:   ESRD -outpatient orders: [facility], TTS. 4hrs 15 min. F180. Flow rates: 425/autoflow 1.5. 3k 2.5cal. EDW 180kg. Access: TDC. Meds: Mircers 200 mcg every two weeks (last dose 6/13), sensipar 60mg  three times per week, hectorol three times per week  -historically noncompliant and does not stay for full prescribed treatment times and usually has high IDWG b/w treatments -s/p emergent HD 6/28, HD on TTS. HD today  AHRF -UF as tolerated. Possible PNA-abx per primary service  Sepsis -s/p broad coverage in ER. Currently receiving rocephin and vanc. Cultures pending.  Afib w/ RVR -on eliquis -per priamry  Volume/ hypertension HFrEF -UF as tolerated  Anemia of Chronic Kidney Disease -hgb currently 10.4 -r/s ESA once Hgb <10 -transfuse PRN for Hgb <7  # Secondary Hyperparathyroidism/Hyperphosphatemia -resume home binders -hectorol, sensipar with HD  Hematuria -will start with checking renal ultrasound  ADDENDUM 10:56am: Patient moved to ICU for worsening respiratory status, afib with RVR, and worsening hypotension. Discussed with CCM. Central line for pressors currently being placed. No IHD today, will start CRRT via his preexisting TDC. All 2K bags. Goal UF will be net neg 100-150cc/hr. Discussed with ICU RN as well. Call on-call nephrology with any q's/concerns.  Anthony Sar, MD [facility]  History of Present Illness: Phillip Young is a/an 37 y.o. male with a past medical history of ESRD, HFrEF, A-fib, chronic respiratory failure, hidradenitis, morbid obesity, OSA, history of MSSA bacteremia, history of HIT who presents with chest pain, shortness of breath.  Last dialysis was on Tuesday, missed Thursday.  Symptoms started about 3 days prior to admission.  Shortness of breath limiting quality of life.  Called EMS to come to the hospital.  Has been having cough with increased sputum.  He also reported some hematuria as  well. Upon review of his outpatient dialysis records, he typically does not stay for his full prescribed treatment time and typically does not adhere to fluid restriction has high intradialytic weight gains.  Required BiPAP in the ER.  He did receive dialysis last night with a net UF of 4 L.  He also has leukocytosis with fevers and received dose of vancomycin and cefepime in the ER.  Currently on Rocephin and IV Vanco given concern for sepsis.  Also has A-fib with RVR here. Patient seen and examined in room this AM. He reports that he is still SOB, wants to get better. Denies any issues with [facility] as an outpatient.   Medications:  Current Facility-Administered Medications  Medication Dose Route Frequency Provider Last Rate Last Admin   acetaminophen (TYLENOL) tablet 650 mg  650 mg Oral Q6H PRN Marrianne Mood, MD       Or   acetaminophen (TYLENOL) suppository 650 mg  650 mg Rectal Q6H PRN Marrianne Mood, MD       apixaban Everlene Balls) tablet 5 mg  5 mg Oral BID Marrianne Mood, MD       cefTRIAXone (ROCEPHIN) 2 g in sodium chloride 0.9 % 100 mL IVPB  2 g Intravenous Q24H Marrianne Mood, MD       Chlorhexidine Gluconate Cloth 2 % PADS 6 each  6 each Topical Q0600 Anthony Sar, MD       cinacalcet (SENSIPAR) tablet 60 mg  60 mg Oral Q T,Th,Sa-HD Anthony Sar, MD       doxercalciferol (HECTOROL) capsule 4 mcg  4 mcg Oral Q T,Th,Sat-1800 Miguel Aschoff, MD       guaiFENesin [facility]) 12 hr tablet  600 mg  600 mg Oral BID Marrianne Mood, MD   600 mg at 01/22/23 0127   melatonin tablet 5 mg  5 mg Oral QHS Marrianne Mood, MD   5 mg at 01/22/23 0127   midodrine (PROAMATINE) tablet 5 mg  5 mg Oral TID WC Marrianne Mood, MD       polyethylene glycol (MIRALAX / GLYCOLAX) packet 17 g  17 g Oral Daily PRN Marrianne Mood, MD       vancomycin (VANCOCIN) IVPB 1000 mg/200 mL premix  1,000 mg Intravenous Q T,Th,Sa-HD Titus Mould, RPH         ALLERGIES Amiodarone, Coreg [carvedilol],  Heparin, and Metoprolol  MEDICAL HISTORY Past Medical History:  Diagnosis Date   Acute on chronic respiratory failure with hypoxia ([name]) 04/21/2021   Acute on chronic systolic (congestive) heart failure ([name]) 02/26/2020   Biventricular congestive heart failure ([name])    Last Echo 11/2019 at [facility] reveals EF 20%   Class 3 severe obesity due to excess calories with serious comorbidity and body mass index (BMI) of 50.0 to 59.9 in adult [facility]) 02/26/2020   Essential hypertension 02/26/2020   GERD without esophagitis 02/26/2020   Hidradenitis suppurativa 02/26/2020   OSA (obstructive sleep apnea) 02/26/2020   Pneumonia    Pre-diabetes    Prediabetes 02/26/2020   Renal disorder      SOCIAL HISTORY Social History   Socioeconomic History   Marital status: Single    Spouse name: Not on file   Number of children: Not on file   Years of education: Not on file   Highest education level: Not on file  Occupational History   Not on file  Tobacco Use   Smoking status: Former    Packs/day: 1    Types: Cigarettes    Quit date: 2019    Years since quitting: 5.4    Passive exposure: Current   Smokeless tobacco: Former   Tobacco comments:    quit in 2019  Vaping Use   Vaping Use: Never used  Substance and Sexual Activity   Alcohol use: Never   Drug use: Never   Sexual activity: Not on file  Other Topics Concern   Not on file  Social History Narrative   Not on file   Social Determinants of Health   Financial Resource Strain: High Risk (09/21/2022)   Overall Financial Resource Strain (CARDIA)    Difficulty of Paying Living Expenses: Hard  Food Insecurity: No Food Insecurity (01/22/2023)   Hunger Vital Sign    Worried About Running Out of Food in the Last Year: Never true    Ran Out of Food in the Last Year: Never true  Transportation Needs: No Transportation Needs (01/22/2023)   PRAPARE - Administrator, Civil Service (Medical): No    Lack of Transportation  (Non-Medical): No  Physical Activity: Inactive (09/21/2022)   Exercise Vital Sign    Days of Exercise per Week: 0 days    Minutes of Exercise per Session: 0 min  Stress: No Stress Concern Present (09/21/2022)   [employer] of Occupational Health - Occupational Stress Questionnaire    Feeling of Stress : Not at all  Social Connections: Unknown (09/21/2022)   Social Connection and Isolation Panel [NHANES]    Frequency of Communication with Friends and Family: More than three times a week    Frequency of Social Gatherings with Friends and Family: Never    Attends Religious Services: Not on file  Active Member of Clubs or Organizations: Not on file    Attends Club or Organization Meetings: Never    Marital Status: Never married  Intimate Partner Violence: Not At Risk (01/22/2023)   Humiliation, Afraid, Rape, and Kick questionnaire    Fear of Current or Ex-Partner: No    Emotionally Abused: No    Physically Abused: No    Sexually Abused: No     FAMILY HISTORY Family History  Problem Relation Age of Onset   Heart disease Mother    Hypertension Mother    Pulmonary Hypertension Mother    Drug abuse Father        died due to Heroin overdose     Review of Systems: 12 systems were reviewed and negative except per HPI  Physical Exam: Vitals:   01/22/23 0201 01/22/23 0309  BP:  103/83  Pulse:  68  Resp: (!) 46 20  Temp:  98.2 F (36.8 C)  SpO2:  100%   Total I/O In: 400 [P.O.:400] Out: 110 [Urine:110]  Intake/Output Summary (Last 24 hours) at 01/22/2023 1610 Last data filed at 01/22/2023 0350 Gross per 24 hour  Intake 500 ml  Output 110 ml  Net 390 ml   General: sitting up at edge of bed, NAD HEENT: anicteric sclera, MMM CV: irreg irreg, tachy Lungs: decreased breath sounds bibasilar, no overt w/r/r/c appreciated (difficult to hear given body habitus) Abd: soft, non-tender, non-distended, obese Ext: trace edema b/l LEs Neuro: normal speech, no gross focal deficits   Dialysis access: [facility] c/d/i  Test Results Reviewed Lab Results  Component Value Date   NA 130 (L) 01/21/2023   K 6.2 (H) 01/21/2023   CL 97 (L) 01/21/2023   CO2 16 (L) 01/21/2023   BUN 72 (H) 01/21/2023   CREATININE 13.16 (H) 01/21/2023   CALCIUM 8.7 (L) 01/21/2023   ALBUMIN 3.3 (L) 01/21/2023   PHOS 3.3 01/21/2023    I have reviewed relevant outside healthcare records

## 2023-01-22 NOTE — Progress Notes (Signed)
Notified Hannah with Rapid Response and Gina, AC of need to transfer patient to 2H. Verbal report called to Toni Amend [facility] RN).

## 2023-01-22 NOTE — Consult Note (Addendum)
Cardiology Consultation   Patient ID: Phillip Young MRN: [id number]; DOB: Oct 18, 1985  Admit date: 01/21/2023 Date of Consult: 01/22/2023  PCP:  Marrianne Mood, MD    [facility] Providers Cardiologist:  Marca Ancona, MD  Electrophysiologist:  Will Jorja Loa, MD       Patient Profile:   Phillip Young is a 37 y.o. male with a hx of chronic HFrEF (normal coronaries by cath 2021), PAF/AFL (multiple prior DCCVs, attempted ablation at [facility] 06/2022), prior concern for amiodarone lung toxicity treated with steroids, chronic respiratory failure on home O2, ESRD on HD with episodic noncompliance with HD, OSA/OHS, severe morbid obesity, hidradenitis suppurativa, anemia, thrombocytopenia, LE wounds who is being seen 01/22/2023 for the evaluation of rapid afib at the request of Dr. Laveda Norman.  History of Present Illness:   Phillip Young has an extremely complex history with multiple admissions for SOB, a/c hypoxic respiratory failure, difficult to manage volume overload, and difficult AF/AFL with multiple prior cardioversions. He required milrinone for cardiogenic shock in 2021. In 10-05/2022 he was admitted with prolonged course of decompensated heart failure and AF/AFL with RVR. Amiodarone induced lung toxicity was diagnosed during this admission, started on prednisone. He was then admitted 11-06/2022 to [facility] with recurrent HF, again requiring regular iHD sessions for volume removal. IV diuresis was ineffective. He was treated with extended prednisone taper continued for amiodarone lung toxicity. During this hospital course, EP attempted ablation for AF on 12/6. Patient was NSR at time of discharge. He was readmitted 06/2022 to [facility] for prolonged hospital course. Had difficulty with volume removal during this admission due to patient electing to terminate treatment sessions early and required escalation to ICU care from 12/12-12/14 for CRRT. Course was complicated by recurrent  AFRVR with HR 170-190s requiring readmission to ICU. He was started on dronedarone with improvement in rates. Our team last saw him during similar admission 07/2022 for a/c CHF and respiratory failure driven by volume overload in setting of missed outpatient HD sessions and refusing care. GDMT was otherwise limited by hypotension requiring midodrine. He was not felt to be a candidate for ICD given obesity and HD and not a candidate for advanced therapies. It was recommended to avoid dronederone due to CHF. He was felt to have no options for antiarrhythmic therapy (not a candidate for amiodarone with pulmonary toxicity and not a candidate for Tikosyn with ESRD). He was reported to be in NSR/ST that admission. EKG 08/05/22 showed NSR; EKG 08/09/22 was suspicious for atrial flutter 2:1 vs sinus tach. Do not see had cardiac follow-up since that time. Repeat echo ordered by IM 11/2022 showed EF 20-25%, global HK, moderately dilated LV, moderately dilated LA, small pericardial effusion without significant change from prior. Our team did recently receive preop clearance request for basilic fistula and in-office visit was recommended.  He presented this admission with 3 day hx of diffuse body pain (chest, back, joints, neck), SOB, hypoxia, and green phlegm cough. He was febrile to 102 in ED. He had missed HD on Thursday. Nephrology note outlined that he is typically noncompliant with HD, does not stay for full treatment times and had high IDWG upon review of his outpatient HD records. He was also found to have leukocytosis up to 35.9k concerning for sepsis and is being treated with abx. Blood cx are pending. CXR showed vascular congestion. He has required BiPAP.  Primary team ordered CTA to exclude PE but test cancelled as patient reported he had a reaction before with ddifficulty  breathing/choking.  He apparently still makes a small amount of urine and was on torsemide 3x/week PTA though had had some recent possible  hematuria which is being evaluated by medicine team. Hgb is relatively stable from prior. hsTroponin 57->62. Cardiology called to assist with rapid AFib, present on admission. The patient is being seen by nephrology and is undergoing UF. He reports possibly missing some doses of his Eliquis recently. He currently feels terrible and is SOB, tachypneic, and complains of all-over body pain. He periodically rolls his eyes back when speaking but remains coherent.   Past Medical History:  Diagnosis Date   Acute on chronic respiratory failure with hypoxia ([name]) 04/21/2021   Acute on chronic systolic (congestive) heart failure ([name]) 02/26/2020   Amiodarone toxicity    Anemia    Atrial flutter ([name])    Biventricular congestive heart failure ([name])    Chronic hypoxemic respiratory failure ([name])    Class 3 severe obesity due to excess calories with serious comorbidity and body mass index (BMI) of 50.0 to 59.9 in adult [facility]) 02/26/2020   Essential hypertension 02/26/2020   GERD without esophagitis 02/26/2020   Hidradenitis suppurativa 02/26/2020   NICM (nonischemic cardiomyopathy) ([name])    Obesity hypoventilation syndrome ([name])    OSA (obstructive sleep apnea)    PAF (paroxysmal atrial fibrillation) ([name])    Pneumonia    Prediabetes 02/26/2020    Past Surgical History:  Procedure Laterality Date   ABSCESS DRAINAGE     AV FISTULA PLACEMENT Left 08/21/2021   Procedure: LEFT ARM ARTERIOVENOUS (AV) FISTULA.;  Surgeon: Nada Libman, MD;  Location: [facility] OR;  Service: Vascular;  Laterality: Left;   CARDIAC CATHETERIZATION     CARDIOVERSION N/A 10/09/2021   Procedure: CARDIOVERSION;  Surgeon: Laurey Morale, MD;  Location: [facility] ENDOSCOPY;  Service: Cardiovascular;  Laterality: N/A;   CARDIOVERSION N/A 05/28/2022   Procedure: CARDIOVERSION;  Surgeon: Laurey Morale, MD;  Location: [facility] ENDOSCOPY;  Service: Cardiovascular;  Laterality: N/A;   CARDIOVERSION N/A 06/07/2022   Procedure: CARDIOVERSION;   Surgeon: Laurey Morale, MD;  Location: [facility] ENDOSCOPY;  Service: Cardiovascular;  Laterality: N/A;   IR FLUORO GUIDE CV LINE RIGHT  03/10/2021   IR FLUORO GUIDE CV LINE RIGHT  04/22/2021   IR FLUORO GUIDE CV LINE RIGHT  08/20/2021   IR US GUIDE VASC ACCESS RIGHT  03/10/2021   IR US GUIDE VASC ACCESS RIGHT  04/22/2021   RIGHT HEART CATH N/A 03/06/2021   Procedure: RIGHT HEART CATH;  Surgeon: Dolores Patty, MD;  Location: [facility] INVASIVE CV LAB;  Service: Cardiovascular;  Laterality: N/A;   RIGHT/LEFT HEART CATH AND CORONARY ANGIOGRAPHY N/A 03/04/2020   Procedure: RIGHT/LEFT HEART CATH AND CORONARY ANGIOGRAPHY;  Surgeon: Laurey Morale, MD;  Location: [facility] INVASIVE CV LAB;  Service: Cardiovascular;  Laterality: N/A;   TEE WITHOUT CARDIOVERSION N/A 05/05/2021   Procedure: TRANSESOPHAGEAL ECHOCARDIOGRAM (TEE);  Surgeon: Laurey Morale, MD;  Location: [facility] ENDOSCOPY;  Service: Cardiovascular;  Laterality: N/A;   TEMPORARY DIALYSIS CATHETER  03/06/2021   Procedure: TEMPORARY DIALYSIS CATHETER;  Surgeon: Dolores Patty, MD;  Location: [facility] INVASIVE CV LAB;  Service: Cardiovascular;;     Home Medications:  Prior to Admission medications   Medication Sig Start Date End Date Taking? Authorizing Provider  apixaban (ELIQUIS) 5 MG TABS tablet Take 1 tablet (5 mg total) by mouth 2 (two) times daily. 08/18/22   Steffanie Rainwater, MD  calamine lotion Apply 1 Application topically 2 (two) times daily. 08/13/22  Adron Bene, MD  camphor-menthol [facility]) lotion Apply topically as needed for itching. 08/13/22   Adron Bene, MD  clindamycin (CLINDAGEL) 1 % gel Apply topically 2 (two) times daily. Apply to hydradenitis wounds 08/13/22   Belva Agee, MD  fluticasone furoate-vilanterol (BREO ELLIPTA) 100-25 MCG/ACT AEPB INHALE 1 PUFF INTO THE LUNGS DAILY 10/13/22   Belva Agee, MD  lanthanum (FOSRENOL) 1000 MG chewable tablet Chew 2,000 mg by mouth 3 (three) times daily with meals.     [provider]  melatonin 5 MG TABS Take 1 tablet (5 mg total) by mouth at bedtime. 06/10/22   Crissie Sickles, MD  midodrine (PROAMATINE) 5 MG tablet TAKE 1 TABLET(5 MG) BY MOUTH THREE TIMES DAILY WITH MEALS 06/10/22   Belva Agee, MD  torsemide (DEMADEX) 100 MG tablet TAKE 1 TABLET BY MOUTH 3 TIMES A WEEK ON NON-DIALYSIS DAYS. 08/18/22   Steffanie Rainwater, MD    Inpatient Medications: Scheduled Meds:  apixaban  5 mg Oral BID   Chlorhexidine Gluconate Cloth  6 each Topical Q0600   cinacalcet  60 mg Oral Q T,Th,Sa-HD   doxercalciferol  4 mcg Oral Q T,Th,Sat-1800   guaiFENesin  600 mg Oral BID   melatonin  5 mg Oral QHS   Continuous Infusions:  cefTRIAXone (ROCEPHIN)  IV     vancomycin     PRN Meds: acetaminophen **OR** acetaminophen, polyethylene glycol  Allergies:    Allergies  Allergen Reactions   Amiodarone Other (See Comments)    Suspicion for amiodarone lung/hepatotoxicity  Other Reaction(s): Suspicion amiodarone toxicity   Coreg [Carvedilol] Shortness Of Breath and Diarrhea    Wheezing    Heparin Other (See Comments)    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, SRA positive   Metoprolol Other (See Comments)    near syncope    Social History:   Social History   Socioeconomic History   Marital status: Single    Spouse name: Not on file   Number of children: Not on file   Years of education: Not on file   Highest education level: Not on file  Occupational History   Not on file  Tobacco Use   Smoking status: Former    Packs/day: 1    Types: Cigarettes    Quit date: 2019    Years since quitting: 5.4    Passive exposure: Current   Smokeless tobacco: Former   Tobacco comments:    quit in 2019  Vaping Use   Vaping Use: Never used  Substance and Sexual Activity   Alcohol use: Never   Drug use: Never   Sexual activity: Not on file  Other Topics Concern   Not on file  Social History Narrative   Not on file   Social Determinants of Health    Financial Resource Strain: High Risk (09/21/2022)   Overall Financial Resource Strain (CARDIA)    Difficulty of Paying Living Expenses: Hard  Food Insecurity: No Food Insecurity (01/22/2023)   Hunger Vital Sign    Worried About Running Out of Food in the Last Year: Never true    Ran Out of Food in the Last Year: Never true  Transportation Needs: No Transportation Needs (01/22/2023)   PRAPARE - Administrator, Civil Service (Medical): No    Lack of Transportation (Non-Medical): No  Physical Activity: Inactive (09/21/2022)   Exercise Vital Sign    Days of Exercise per Week: 0 days    Minutes of Exercise per Session: 0 min  Stress: No Stress Concern  Present (09/21/2022)   [employer] of Occupational Health - Occupational Stress Questionnaire    Feeling of Stress : Not at all  Social Connections: Unknown (09/21/2022)   Social Connection and Isolation Panel [NHANES]    Frequency of Communication with Friends and Family: More than three times a week    Frequency of Social Gatherings with Friends and Family: Never    Attends Religious Services: Not on Marketing executive or Organizations: Not on file    Attends Banker Meetings: Never    Marital Status: Never married  Intimate Partner Violence: Not At Risk (01/22/2023)   Humiliation, Afraid, Rape, and Kick questionnaire    Fear of Current or Ex-Partner: No    Emotionally Abused: No    Physically Abused: No    Sexually Abused: No    Family History:    Family History  Problem Relation Age of Onset   Heart disease Mother    Hypertension Mother    Pulmonary Hypertension Mother    Drug abuse Father        died due to Heroin overdose     ROS:  Please see the history of present illness.   All other ROS reviewed and negative.     Physical Exam/Data:   Vitals:   01/22/23 0309 01/22/23 0502 01/22/23 0714 01/22/23 0812  BP: 103/83   90/76  Pulse: 68  (!) 152 (!) 145  Resp: 20  (!) 24 20   Temp: 98.2 F (36.8 C)   98.4 F (36.9 C)  TempSrc: Oral   Oral  SpO2: 100%  96% 90%  Weight:  (!) 194.5 kg    Height:        Intake/Output Summary (Last 24 hours) at 01/22/2023 0827 Last data filed at 01/22/2023 0350 Gross per 24 hour  Intake 500 ml  Output 4110 ml  Net -3610 ml      01/22/2023    5:02 AM 01/21/2023   11:52 PM 11/05/2022   10:57 AM  Last 3 Weights  Weight (lbs) 428 lb 11.2 oz 401 lb 7.3 oz 431 lb 9.6 oz  Weight (kg) 194.457 kg 182.1 kg 195.772 kg     Body mass index is 58.14 kg/m.  General:  Ill appearing severely obese AAM with increased WOB HEENT: nonacute Neck: JVD difficult to assess with habitus Vascular: No carotid bruits; Distal pulses 2+ bilaterally Cardiac:  tachycardic, irregular, no acute m/r/g Lungs:  tachypneic, increased WOB, decreased BS diffusely throughout Abd: soft, obese/rounded Ext: 1+ BLE edema superimposed on large baseline leg habitus Musculoskeletal:  No acute deformities, BUE and BLE strength normal and equal Skin: warm and dry  Neuro: A+Ox3 but appears to be getting drowsy, follows commands Psych:  Normal affect   EKG:  The EKG was personally reviewed and demonstrates:  multiple tracings reviewed, AF RVR with nonspecific STTW changes Telemetry:  Telemetry was personally reviewed and demonstrates:  AFib RVR  Relevant CV Studies: 2D echo 12/24/22  1. Left ventricular ejection fraction, by estimation, is 20 to 25%. The  left ventricle has severely decreased function. The left ventricle  demonstrates global hypokinesis. The left ventricular internal cavity size  was moderately dilated. Left  ventricular diastolic function could not be evaluated.   2. Right ventricular systolic function was not well visualized. The right  ventricular size is not well visualized.   3. Left atrial size was moderately dilated.   4. A small pericardial effusion is present. The pericardial effusion  is  circumferential.   5. The mitral valve was not  well visualized. Trivial mitral valve  regurgitation. No evidence of mitral stenosis.   6. The aortic valve was not well visualized. Aortic valve regurgitation  is not visualized. No aortic stenosis is present.   Comparison(s): No significant change from prior study.   Conclusion(s)/Recommendation(s): Technically challenging study, even with  use of echo contrast. EF similar at 20-25%. Valves not well visualized but  no severe disease by doppler.   Laboratory Data:  High Sensitivity Troponin:   Recent Labs  Lab 01/21/23 1739 01/21/23 2115  TROPONINIHS 57* 62*     Chemistry Recent Labs  Lab 01/21/23 1739 01/21/23 1748 01/21/23 1749 01/21/23 1842 01/22/23 0618  NA 132*   < > 132* 130* 131*  K 5.4*   < > 5.8* 6.2* 6.1*  CL 97*  --  104 97* 95*  CO2 18*  --   --  16* 20*  GLUCOSE 91  --  85 78 141*  BUN 70*  --  78* 72* 51*  CREATININE 13.54*  --  14.00* 13.16* 10.01*  CALCIUM 8.9  --   --  8.7* 8.8*  GFRNONAA 4*  --   --  5* 6*  ANIONGAP 17*  --   --  17* 16*   < > = values in this interval not displayed.    Recent Labs  Lab 01/21/23 1739 01/21/23 1842 01/22/23 0618  PROT 7.6  --   --   ALBUMIN 3.4* 3.3* 3.4*  AST 18  --   --   ALT 11  --   --   ALKPHOS 97  --   --   BILITOT 2.3*  --   --    Lipids No results for input(s): "CHOL", "TRIG", "HDL", "LABVLDL", "LDLCALC", "CHOLHDL" in the last 168 hours.  Hematology Recent Labs  Lab 01/21/23 1739 01/21/23 1748 01/21/23 1749 01/21/23 1842 01/22/23 0618  WBC 20.7*  --   --  21.6* 35.9*  RBC 3.31*  --   --  3.11* 3.24*  HGB 10.5*   < > 11.9* 10.2* 10.4*  HCT 33.1*   < > 35.0* 31.9* 31.8*  MCV 100.0  --   --  102.6* 98.1  MCH 31.7  --   --  32.8 32.1  MCHC 31.7  --   --  32.0 32.7  RDW 16.9*  --   --  16.7* 16.6*  PLT 192  --   --  208 196   < > = values in this interval not displayed.   Thyroid No results for input(s): "TSH", "FREET4" in the last 168 hours.  BNP Recent Labs  Lab 01/21/23 1739  BNP  1,528.6*    DDimer No results for input(s): "DDIMER" in the last 168 hours.   Radiology/Studies:  DG Chest Port 1 View  Result Date: 01/21/2023 CLINICAL DATA:  Short of breath, chest pain EXAM: PORTABLE CHEST 1 VIEW COMPARISON:  08/05/2022 FINDINGS: Two frontal views of the chest are obtained. Evaluation is limited by portable technique and patient body habitus. Right internal jugular catheter unchanged. Cardiac silhouette is enlarged but stable. Increased central vascular congestion without airspace disease, effusion, or pneumothorax. IMPRESSION: 1. Increased central vascular congestion without overt edema. 2. Stable enlarged cardiac silhouette. Electronically Signed   By: Sharlet Salina M.D.   On: 01/21/2023 18:22     Assessment and Plan:   1. Acute on chronic hypoxic respiratory failure 2. Sepsis with marked leukocytosis, on broad  spectrum abx 3. Acute on chronic HFrEF in setting of ESRD with missed/complete HD sessions 4. Persistent Afib RVR with h/o amio-induced pulmonary toxicity 5. Chest pain in context of all-over body pain, minimal troponin elevation, normal coronaries 2021 6. Severe morbid obesity with OSA/OHS   Patient seen urgently with Dr. Shari Prows. Has been in AF RVR since admission with prior admissions for similar presentations except this admission complicated by sepsis with marked leukocytosis. Suspect arrhythmia driven by severe volume overload in setting of HD nonadherence and HFrEF as well as infection. Unfortunately our options for rate control and rhythm control are severely limited. He is not a candidate for amiodarone due to h/o pulmonary toxicity, not a candidate for Tikosyn due to ESRD, not a candidate for flecainide with structural heart disease. Cannot use digoxin with renal failure. Cannot add diltiazem with LV dysfunction. Cannot add beta blockade right now given hypotension requiring midodrine. Was previously not felt to be a candidate for advanced therapies  during 07/2022 per HF team. Do not suspect ACS given relatively low/flat troponin and prior cath in 2021 with normal coronaries - suspect chest pain is in the context of his all-over diffuse myalgias. He looks extremely ill and we are worried about progressive decompensation and possible CO2 retention given that he is beginning to appear a little drowsy. Dr. Shari Prows spoke with the internal medicine team that called the consult to [place] and recommended they get ICU team involved to help manage and transfer to ICU. ABG is pending. Prognosis extremely guarded. We will continue to follow with you.   Risk Assessment/Risk Scores:        New York Heart Association (NYHA) Functional Class NYHA Class IV  CHA2DS2-VASc Score =     This indicates a  % annual risk of stroke. The patient's score is based upon:       For questions or updates, please contact [facility] Please consult [email or web address] for contact info under    Signed, Laurann Montana, PA-C  01/22/2023 8:27 AM  The patient was seen examined and agree with Ronie Spies, PA-C as detailed above.  In brief, the patient is a 37 year old male with very complex medical history with history of chronic systolic HF with EF 20-25% (nonischemic with ? History of myocarditis), pAfib/Aflutter with multiple DCCVs and attempted Aflutter ablation at [facility], concern for amiodarone lung toxicity, chronic hypoxic respiratory failure on home O2, ESRD on HD, OSA/OHS, severe morbid obesity, and medication/HD noncompliance who presented with worsening SOB in the setting of missing HD session found to be in Afib with RVR for which Cardiology was consulted.  Overall, very complex and unfortunate case with very limited options. He has NICM with EF 20-25% with medication noncompliance, ESRD, morbid obesity, and difficult to manage Afib. Patient seen by Dr. Elberta Fortis in the past and was deemed not a candidate for amiodarone due to history of lung toxicity, tikosyn due to  ESRD, AVN ablation due to ESRD and risk on infection with PPM, or micra candidate due to need for chronic RV pacing. He is not able to tolerate digoxin due to ESRD and BP is soft limiting use of metoprolol. He has no antiarrhythmic options and given soft blood pressures and continued volume overload, hesistant to start metop at this time. He is also notably very dyspneic on exam and somnolent on BiPAP with concerning for respiratory status. WBC 35 with also concern for underlying sepsis. Lactate 2.4. Recommend transfer to [facility] for ongoing care. HF team  aware. Agree that he has an overall poor prognosis with very limited options and likely will need palliative evaluation.  GEN: Tachypneic and dyspneic on BiPAP Neck: Unable to assess JVD due to body habitus Cardiac: Tachycardic, irregular Respiratory: Diminished GI: Morbidly obese, soft  MS: 1+ edema, luke-warm Neuro:  Nonfocal  Psych: Normal affect    Plan: -Transfer to 2H given worsening respiratory status and concern for sepsis with WBC 35 -Unfortunately, he has not options for rate/rhythm control right now due to lung toxicity with amiodarone (and patient refuses to take it), ESRD limiting use of dig, and soft BP with volume overload making BB high risk -Not a candidate for re-do ablation or AVN ablation per prior discussions with EP -Could consider repeat TEE/DCCV however respiratory status would make this very high risk and he is unlikely to maintain rhythm  -Recommend continued volume management with HD -If BP/volume status improves, can add low dose metop but has required midodrine/metop in past -Other GDMT limited due to ESRD and hypotension -Overall, very poor prognosis and recommend palliative involvement  Laurance Flatten, MD

## 2023-01-22 NOTE — Procedures (Signed)
Arterial Catheter Insertion Procedure Note  Phillip Young  [id number]  Oct 30, 1985  Date:01/22/23  Time:1:33 PM    Provider Performing: Cheri Fowler    Procedure: Insertion of Arterial Line (40981) with US guidance ([id number])   Indication(s) Blood pressure monitoring and/or need for frequent ABGs  Consent Unable to obtain consent due to emergent nature of procedure.  Anesthesia None   Time Out Verified patient identification, verified procedure, site/side was marked, verified correct patient position, special equipment/implants available, medications/allergies/relevant history reviewed, required imaging and test results available.   Sterile Technique Maximal sterile technique including full sterile barrier drape, hand hygiene, sterile gown, sterile gloves, mask, hair covering, sterile ultrasound probe cover (if used).   Procedure Description Area of catheter insertion was cleaned with chlorhexidine and draped in sterile fashion. With real-time ultrasound guidance an arterial catheter was placed into the right  aXILALRY  artery.  Appropriate arterial tracings confirmed on monitor.     Complications/Tolerance None; patient tolerated the procedure well.   EBL Minimal   Specimen(s) None

## 2023-01-22 NOTE — Progress Notes (Signed)
Contacted Dr. Cyndie Chime to clarify orders. MD states that patient may eat breakfast and that Bipap is only needed at night. Requested to know plan for pulse that is remaining in 1302 to 150s. Dr. Cyndie Chime states that cardiology is on way to assess patient and to determine plan,  Dr. Cyndie Chime in to assess patient at 51. MD states to send patient to stat CT before sending to HD.  Cardiology in to assess patient at 0830 and decided to transfer to [facility]. Cards states that Internal med will place transfer order.

## 2023-01-22 NOTE — Progress Notes (Signed)
   01/22/23 0201  BiPAP/CPAP/SIPAP  $ Non-Invasive Ventilator  Non-Invasive Vent Initial  $ Face Mask Large  Yes  BiPAP/CPAP/SIPAP Pt Type Adult  BiPAP/CPAP/SIPAP V60  Mask Type Full face mask  Mask Size Large  Set Rate 12 breaths/min  Respiratory Rate 38 breaths/min  IPAP 16 cmH20  EPAP 8 cmH2O  FiO2 (%) 50 %  Minute Ventilation 31  Leak 4  Peak Inspiratory Pressure (PIP) 20  Tidal Volume (Vt) 767  Patient Home Equipment No  Press High Alarm 25 cmH2O  Press Low Alarm 5 cmH2O  BiPAP/CPAP /SiPAP Vitals  Resp (!) 46  MEWS Score/Color  MEWS Score 6  MEWS Score Color Red

## 2023-01-22 NOTE — Progress Notes (Signed)
Pharmacy Antibiotic Note  Phillip Young is a 37 y.o. male admitted on 01/21/2023 with volume overload. Pt is ESRD on HD now to start CRRT. Pharmacy has been consulted for vancomycin and pip-tazo dosing.  Plan: Vancomycin 2000mg  IV q24h Pip-tazo 3.375g IV q6h over 30 min Follow RRT plans, Cx, LOT Vancomycin levels Monday   Height: 6' (182.9 cm) Weight: (!) 194.5 kg (428 lb 11.2 oz) IBW/kg (Calculated) : 77.6  Temp (24hrs), Avg:98.7 F (37.1 C), Min:97.5 F (36.4 C), Max:102 F (38.9 C)  Recent Labs  Lab 01/21/23 1739 01/21/23 1749 01/21/23 1804 01/21/23 1842 01/21/23 2308 01/22/23 0618  WBC 20.7*  --   --  21.6*  --  35.9*  CREATININE 13.54* 14.00*  --  13.16*  --  10.01*  LATICACIDVEN  --   --  2.5*  --  2.4*  --     Estimated Creatinine Clearance: 17.8 mL/min (A) (by C-G formula based on SCr of 10.01 mg/dL (H)).    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  Fredonia Highland, PharmD, BCPS, [facility] Clinical Pharmacist [phone] Please check AMION for all [facility] Pharmacy numbers 01/22/2023

## 2023-01-22 NOTE — Progress Notes (Signed)
Patient transferred to [facility] at 0935 via bed with RN, NT, Rapid Response RN and RT present.

## 2023-01-22 NOTE — Consult Note (Signed)
NAME:  Phillip Young, MRN:  [id number], DOB:  26-Jan-1986, LOS: 1 ADMISSION DATE:  01/21/2023, CONSULTATION DATE:  01/22/23 REFERRING MD:  Danise Edge, CHIEF COMPLAINT:  SOB   History of Present Illness:  37 year old man well known to our service w/ hx of ESRD, nonischemic cardiomyopathy, refractory afib, possible amio lung toxicity presenting from home with fevers, joint pains, SOB.  Missed HD on Thursday.  Has hx of leaving HD early.  Looks to be 10kg over dry weight if scales are accurate.  His breathing status is worsening, in afib/RVR, and BP borderline so coming to ICU for further management.  ROS as below.  Pertinent  Medical History   Past Medical History:  Diagnosis Date   Acute on chronic respiratory failure with hypoxia ([name]) 04/21/2021   Acute on chronic systolic (congestive) heart failure ([name]) 02/26/2020   Amiodarone toxicity    Anemia    Atrial flutter ([name])    Biventricular congestive heart failure ([name])    Chronic hypoxemic respiratory failure ([name])    Class 3 severe obesity due to excess calories with serious comorbidity and body mass index (BMI) of 50.0 to 59.9 in adult [facility]) 02/26/2020   Essential hypertension 02/26/2020   GERD without esophagitis 02/26/2020   Hidradenitis suppurativa 02/26/2020   NICM (nonischemic cardiomyopathy) ([name])    Obesity hypoventilation syndrome ([name])    OSA (obstructive sleep apnea)    PAF (paroxysmal atrial fibrillation) ([name])    Pneumonia    Prediabetes 02/26/2020     Significant Hospital Events: Including procedures, antibiotic start and stop dates in addition to other pertinent events   6/28 admit, emergent HD 6/29 Afib/RVR, shock, ICU transfer  Interim History / Subjective:  Consulted  Objective   Blood pressure 90/76, pulse (!) 142, temperature 98.4 F (36.9 C), temperature source Oral, resp. rate (!) 21, height 6' (1.829 m), weight (!) 194.5 kg, SpO2 100 %.    Vent Mode: PSV;BIPAP FiO2 (%):  [40 %-70 %] 40 % PEEP:  [5 cmH20]  5 cmH20 Pressure Support:  [12 cmH20] 12 cmH20   Intake/Output Summary (Last 24 hours) at 01/22/2023 0932 Last data filed at 01/22/2023 0350 Gross per 24 hour  Intake 500 ml  Output 4110 ml  Net -3610 ml   Filed Weights   01/21/23 2352 01/22/23 0502  Weight: (!) 182.1 kg (!) 194.5 kg    Examination: General: mild resp distress on bipap HENT: bipap good seal Lungs: diminished due to body habitus, occasional accessory muscle use Cardiovascular: irregular, tachy Abdomen: protuberant, distant BS Extremities: hard to tell edema vs adipose Neuro: moves everything to command, RASS 0 Psych; anxious, poor insight  CXR maybe LLL infiltrate? Really hard to tell with body habitus  Resolved Hospital Problem list   N/A  Assessment & Plan:  Acute on chronic hypoxemic respiratory failure- in context of viral-type syndrome and missed HD; likely multifactorial  Probable sepsis POA ?PNA- cultures pending, on broad spectrum abx  Hematuria POA- renal US pending  ESRD on HD  NICM and volume overload  Afib/RVR- recurrent issue for him without good treatment options, would work first on reducing sympathetic drive from anxiety/pain  Anxiety related to breathing and pain- will require careful treatment due to risk of intubation  - Transfer to ICU - Query nephro on CRRT; he would need central access for pressors and/or inotropes; likely will need to proceed with ongoing hyperkalemia - Precedex to facilitate BIPAP, cautious pain meds, will start with IV tylenol - Vanc/zosyn, check RVP,  Pct; narrow pending culture data - AHF likely to start following  Best Practice (right click and "Reselect all SmartList Selections" daily)   Diet/type: renal diet depending on BIPAP  DVT prophylaxis: DOAC GI prophylaxis: N/A Lines: N/A Foley:  N/A Code Status:  full code Last date of multidisciplinary goals of care discussion [pending]  Labs   CBC: Recent Labs  Lab 01/21/23 1739 01/21/23 1748  01/21/23 1749 01/21/23 1842 01/22/23 0618  WBC 20.7*  --   --  21.6* 35.9*  NEUTROABS 19.4*  --   --   --   --   HGB 10.5* 11.2* 11.9* 10.2* 10.4*  HCT 33.1* 33.0* 35.0* 31.9* 31.8*  MCV 100.0  --   --  102.6* 98.1  PLT 192  --   --  208 196    Basic Metabolic Panel: Recent Labs  Lab 01/21/23 1739 01/21/23 1748 01/21/23 1749 01/21/23 1842 01/22/23 0618  NA 132* 132* 132* 130* 131*  K 5.4* 5.8* 5.8* 6.2* 6.1*  CL 97*  --  104 97* 95*  CO2 18*  --   --  16* 20*  GLUCOSE 91  --  85 78 141*  BUN 70*  --  78* 72* 51*  CREATININE 13.54*  --  14.00* 13.16* 10.01*  CALCIUM 8.9  --   --  8.7* 8.8*  PHOS  --   --   --  3.3 5.7*   GFR: Estimated Creatinine Clearance: 17.8 mL/min (A) (by C-G formula based on SCr of 10.01 mg/dL (H)). Recent Labs  Lab 01/21/23 1739 01/21/23 1804 01/21/23 1842 01/21/23 2308 01/22/23 0618  WBC 20.7*  --  21.6*  --  35.9*  LATICACIDVEN  --  2.5*  --  2.4*  --     Liver Function Tests: Recent Labs  Lab 01/21/23 1739 01/21/23 1842 01/22/23 0618  AST 18  --   --   ALT 11  --   --   ALKPHOS 97  --   --   BILITOT 2.3*  --   --   PROT 7.6  --   --   ALBUMIN 3.4* 3.3* 3.4*   No results for input(s): "LIPASE", "AMYLASE" in the last 168 hours. No results for input(s): "AMMONIA" in the last 168 hours.  ABG    Component Value Date/Time   PHART 7.344 (L) 03/05/2020 1123   PCO2ART 73.1 (HH) 03/05/2020 1123   PO2ART 69.8 (L) 03/05/2020 1123   HCO3 20.1 01/22/2023 0710   TCO2 20 (L) 01/21/2023 1749   ACIDBASEDEF 4.2 (H) 01/22/2023 0710   O2SAT 99.5 01/22/2023 0710     Coagulation Profile: No results for input(s): "INR", "PROTIME" in the last 168 hours.  Cardiac Enzymes: No results for input(s): "CKTOTAL", "CKMB", "CKMBINDEX", "TROPONINI" in the last 168 hours.  HbA1C: Hemoglobin A1C  Date/Time Value Ref Range Status  11/05/2022 11:44 AM 5.6 4.0 - 5.6 % Final   Hgb A1c MFr Bld  Date/Time Value Ref Range Status  03/26/2022 07:58 PM  5.3 4.8 - 5.6 % Final    Comment:    (NOTE) Pre diabetes:          5.7%-6.4%  Diabetes:              >6.4%  Glycemic control for   <7.0% adults with diabetes   08/19/2021 05:40 AM 5.9 (H) 4.8 - 5.6 % Final    Comment:    (NOTE)         Prediabetes: 5.7 - 6.4  Diabetes: >6.4         Glycemic control for adults with diabetes: <7.0     CBG: No results for input(s): "GLUCAP" in the last 168 hours.  Review of Systems:    Positive Symptoms in bold:  Constitutional fevers, chills, weight loss, fatigue, anorexia, malaise  Eyes decreased vision, double vision, eye irritation  Ears, Nose, Mouth, Throat sore throat, trouble swallowing, sinus congestion  Cardiovascular chest pain, paroxysmal nocturnal dyspnea, lower ext edema, palpitations   Respiratory SOB, cough, DOE, hemoptysis, wheezing  Gastrointestinal nausea, vomiting, diarrhea  Genitourinary burning with urination, trouble urinating  Musculoskeletal joint aches, joint swelling, back pain  Integumentary  rashes, skin lesions  Neurological focal weakness, focal numbness, trouble speaking, headaches  Psychiatric depression, anxiety, confusion  Endocrine polyuria, polydipsia, cold intolerance, heat intolerance  Hematologic abnormal bruising, abnormal bleeding, unexplained nose bleeds  Allergic/Immunologic recurrent infections, hives, swollen lymph nodes     Past Medical History:  He,  has a past medical history of Acute on chronic respiratory failure with hypoxia ([name]) (04/21/2021), Acute on chronic systolic (congestive) heart failure ([name]) (02/26/2020), Amiodarone toxicity, Anemia, Atrial flutter ([name]), Biventricular congestive heart failure ([name]), Chronic hypoxemic respiratory failure ([name]), Class 3 severe obesity due to excess calories with serious comorbidity and body mass index (BMI) of 50.0 to 59.9 in adult [facility]) (02/26/2020), Essential hypertension (02/26/2020), GERD without esophagitis (02/26/2020), Hidradenitis  suppurativa (02/26/2020), NICM (nonischemic cardiomyopathy) ([name]), Obesity hypoventilation syndrome ([name]), OSA (obstructive sleep apnea), PAF (paroxysmal atrial fibrillation) ([name]), Pneumonia, and Prediabetes (02/26/2020).   Surgical History:   Past Surgical History:  Procedure Laterality Date   ABSCESS DRAINAGE     AV FISTULA PLACEMENT Left 08/21/2021   Procedure: LEFT ARM ARTERIOVENOUS (AV) FISTULA.;  Surgeon: Nada Libman, MD;  Location: [facility] OR;  Service: Vascular;  Laterality: Left;   CARDIAC CATHETERIZATION     CARDIOVERSION N/A 10/09/2021   Procedure: CARDIOVERSION;  Surgeon: Laurey Morale, MD;  Location: [facility] ENDOSCOPY;  Service: Cardiovascular;  Laterality: N/A;   CARDIOVERSION N/A 05/28/2022   Procedure: CARDIOVERSION;  Surgeon: Laurey Morale, MD;  Location: [facility] ENDOSCOPY;  Service: Cardiovascular;  Laterality: N/A;   CARDIOVERSION N/A 06/07/2022   Procedure: CARDIOVERSION;  Surgeon: Laurey Morale, MD;  Location: [facility] ENDOSCOPY;  Service: Cardiovascular;  Laterality: N/A;   IR FLUORO GUIDE CV LINE RIGHT  03/10/2021   IR FLUORO GUIDE CV LINE RIGHT  04/22/2021   IR FLUORO GUIDE CV LINE RIGHT  08/20/2021   IR US GUIDE VASC ACCESS RIGHT  03/10/2021   IR US GUIDE VASC ACCESS RIGHT  04/22/2021   RIGHT HEART CATH N/A 03/06/2021   Procedure: RIGHT HEART CATH;  Surgeon: Dolores Patty, MD;  Location: [facility] INVASIVE CV LAB;  Service: Cardiovascular;  Laterality: N/A;   RIGHT/LEFT HEART CATH AND CORONARY ANGIOGRAPHY N/A 03/04/2020   Procedure: RIGHT/LEFT HEART CATH AND CORONARY ANGIOGRAPHY;  Surgeon: Laurey Morale, MD;  Location: [facility] INVASIVE CV LAB;  Service: Cardiovascular;  Laterality: N/A;   TEE WITHOUT CARDIOVERSION N/A 05/05/2021   Procedure: TRANSESOPHAGEAL ECHOCARDIOGRAM (TEE);  Surgeon: Laurey Morale, MD;  Location: [facility] ENDOSCOPY;  Service: Cardiovascular;  Laterality: N/A;   TEMPORARY DIALYSIS CATHETER  03/06/2021   Procedure: TEMPORARY DIALYSIS CATHETER;  Surgeon:  Dolores Patty, MD;  Location: [facility] INVASIVE CV LAB;  Service: Cardiovascular;;     Social History:   reports that he quit smoking about 5 years ago. His smoking use included cigarettes. He smoked an average of 1 pack per day. He  has been exposed to tobacco smoke. He has quit using smokeless tobacco. He reports that he does not drink alcohol and does not use drugs.   Family History:  His family history includes Drug abuse in his father; Heart disease in his mother; Hypertension in his mother; Pulmonary Hypertension in his mother.   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 Authorizing Provider  apixaban (ELIQUIS) 5 MG TABS tablet Take 1 tablet (5 mg total) by mouth 2 (two) times daily. 08/18/22   Steffanie Rainwater, MD  calamine lotion Apply 1 Application topically 2 (two) times daily. 08/13/22   Adron Bene, MD  camphor-menthol [facility]) lotion Apply topically as needed for itching. 08/13/22   Adron Bene, MD  clindamycin (CLINDAGEL) 1 % gel Apply topically 2 (two) times daily. Apply to hydradenitis wounds 08/13/22   Belva Agee, MD  fluticasone furoate-vilanterol (BREO ELLIPTA) 100-25 MCG/ACT AEPB INHALE 1 PUFF INTO THE LUNGS DAILY 10/13/22   Belva Agee, MD  lanthanum (FOSRENOL) 1000 MG chewable tablet Chew 2,000 mg by mouth 3 (three) times daily with meals.    [provider]  melatonin 5 MG TABS Take 1 tablet (5 mg total) by mouth at bedtime. 06/10/22   Crissie Sickles, MD  midodrine (PROAMATINE) 5 MG tablet TAKE 1 TABLET(5 MG)  BY MOUTH THREE TIMES DAILY WITH MEALS 06/10/22   Katsadouros, Vasilios, MD  torsemide (DEMADEX) 100 MG tablet TAKE 1 TABLET BY MOUTH 3 TIMES A WEEK ON NON-DIALYSIS DAYS. 08/18/22   Steffanie Rainwater, MD     Critical care time: 34 mins independent of procedures

## 2023-01-22 NOTE — Procedures (Signed)
Intubation Procedure Note  Phillip Young  [id number]  1985-08-16  Date:01/22/23  Time:1:32 PM   Provider Performing:Harlean Regula    Procedure: Intubation (31500)  Indication(s) Respiratory Failure  Consent Risks of the procedure as well as the alternatives and risks of each were explained to the patient and/or caregiver.  Consent for the procedure was obtained and is signed in the bedside chart   Anesthesia Etomidate and Rocuronium   Time Out Verified patient identification, verified procedure, site/side was marked, verified correct patient position, special equipment/implants available, medications/allergies/relevant history reviewed, required imaging and test results available.   Sterile Technique Usual hand hygeine, masks, and gloves were used   Procedure Description Patient positioned in bed supine.  Sedation given as noted above.  Patient was intubated with endotracheal tube using  MAC4 .  View was Grade 2 only posterior commissure .  Number of attempts was 1.  Colorimetric CO2 detector was consistent with tracheal placement.   Complications/Tolerance None; patient tolerated the procedure well. Chest X-ray is ordered to verify placement.   EBL Minimal   Specimen(s) None

## 2023-01-22 NOTE — Progress Notes (Signed)
OT Cancellation Note  Patient Details Name: Phillip Young MRN: [id number] DOB: October 19, 1985   Cancelled Treatment:    Reason Eval/Treat Not Completed: Medical issues which prohibited therapy;Other (comment) (Pt being transferred to ICU. OT to f/u when appropriate.)  Donia Pounds 01/22/2023, 10:40 AM

## 2023-01-22 NOTE — Progress Notes (Signed)
RT assisted with patient transport on Bipap from #E to 2H15 without complications.

## 2023-01-22 NOTE — Progress Notes (Signed)
Patient with increased work of breathing, placed on 13L salter. BIPAP currently on stand-by for patient.  RT will continue to monitor.

## 2023-01-22 NOTE — Progress Notes (Signed)
Patient transported to CT and back without any complications.  

## 2023-01-22 NOTE — Progress Notes (Addendum)
PHARMACY - PHYSICIAN COMMUNICATION CRITICAL VALUE ALERT - BLOOD CULTURE IDENTIFICATION (BCID)  Phillip Young is an 37 y.o. male who presented to [facility] on 01/21/2023 with a chief complaint of SOB.  Assessment:  BCx growing Strep pneumo in 1 of 4 bottles thus far, no resistance.  Also concerned with PNA.  Tmax 102, PCT > 150, WBC 35.9.    Name of physician (or Provider) Contacted: Dr. Katrinka Blazing  Current antibiotics: vancomycin and Zosyn  Changes to prescribed antibiotics recommended:  Patient is on recommended antibiotics - No changes needed Check MRSA PCR and narrow abx quickly F/u repeat CXR  Results for orders placed or performed during the hospital encounter of 01/21/23  Blood Culture ID Panel (Reflexed) (Collected: 01/21/2023  6:04 PM)  Result Value Ref Range   Enterococcus faecalis NOT DETECTED NOT DETECTED   Enterococcus Faecium NOT DETECTED NOT DETECTED   Listeria monocytogenes NOT DETECTED NOT DETECTED   Staphylococcus species NOT DETECTED NOT DETECTED   Staphylococcus aureus (BCID) NOT DETECTED NOT DETECTED   Staphylococcus epidermidis NOT DETECTED NOT DETECTED   Staphylococcus lugdunensis NOT DETECTED NOT DETECTED   Streptococcus species DETECTED (A) NOT DETECTED   Streptococcus agalactiae NOT DETECTED NOT DETECTED   Streptococcus pneumoniae DETECTED (A) NOT DETECTED   Streptococcus pyogenes NOT DETECTED NOT DETECTED   A.calcoaceticus-baumannii NOT DETECTED NOT DETECTED   Bacteroides fragilis NOT DETECTED NOT DETECTED   Enterobacterales NOT DETECTED NOT DETECTED   Enterobacter cloacae complex NOT DETECTED NOT DETECTED   Escherichia coli NOT DETECTED NOT DETECTED   Klebsiella aerogenes NOT DETECTED NOT DETECTED   Klebsiella oxytoca NOT DETECTED NOT DETECTED   Klebsiella pneumoniae NOT DETECTED NOT DETECTED   Proteus species NOT DETECTED NOT DETECTED   Salmonella species NOT DETECTED NOT DETECTED   Serratia marcescens NOT DETECTED NOT DETECTED   Haemophilus influenzae  NOT DETECTED NOT DETECTED   Neisseria meningitidis NOT DETECTED NOT DETECTED   Pseudomonas aeruginosa NOT DETECTED NOT DETECTED   Stenotrophomonas maltophilia NOT DETECTED NOT DETECTED   Candida albicans NOT DETECTED NOT DETECTED   Candida auris NOT DETECTED NOT DETECTED   Candida glabrata NOT DETECTED NOT DETECTED   Candida krusei NOT DETECTED NOT DETECTED   Candida parapsilosis NOT DETECTED NOT DETECTED   Candida tropicalis NOT DETECTED NOT DETECTED   Cryptococcus neoformans/gattii NOT DETECTED NOT DETECTED    Mi Balla D. Laney Potash, PharmD, BCPS, BCCCP 01/22/2023, 2:05 PM

## 2023-01-22 NOTE — Progress Notes (Signed)
01/22/2023  Agitated despite precedex trying to get out of bed. Calmed with ketamine 50mg  Will do ketamine gtt with EtCO2, can use haldol PRN as well; really do not want to intubate him given comorbidities  Myrla Halsted MD PCCM

## 2023-01-22 NOTE — Progress Notes (Signed)
Contacted Fannie Knee (RT) to assess patient due to work of breathing and anxiety. Patient pulling Bipap mask off and wanting to wear HFNC at 15L, however patient desats while on HFNC and appears intermittently sleepy and anxious. RT in to assess and adjust BIPAP as needed.

## 2023-01-22 NOTE — Progress Notes (Signed)
Initial Nutrition Assessment  DOCUMENTATION CODES:   Morbid obesity  INTERVENTION:  Once enteral access obtained and pt appropriate for initiation of enteral nutrition recommend: -Initiate Vital 1.5 at 20 mL/hour and advance by 15 mL/hour every 8 hours to goal rate of 65 mL/hour (1560 mL goal daily volume) -Provide PROSource TF20 60 mL BID per tube -Goal regien provides 2500 kcal, 145 grams of protein, 1186 mL H2O daily  Once enteral access obtained, recommend providing Rena-vite at bedtime per tube.  NUTRITION DIAGNOSIS:   Inadequate oral intake related to inability to eat as evidenced by NPO status.  GOAL:   Patient will meet greater than or equal to 90% of their needs  MONITOR:   Vent status, Labs, Weight trends, I & O's  REASON FOR ASSESSMENT:   Ventilator, Consult Assessment of nutrition requirement/status  ASSESSMENT:   37 year old male with PMHx of ESRD on HD, nonischemic cardiomyopathy, refractory A-fib, possible amio lung toxicity admitted with acute on chronic hypoxmic respiratory failure in setting of virus and missed HD, probable sepsis.  6/29: pt transferred to ICU, plan to start CRRT, later intubated   RD received consult for assessment of nutrition requirements/status per protocol as pt starting CRRT. At time of consult pt was on BiPAP and transferring to ICU. Patient has now been intubated and is sedated. Currently on norepinephrine gtt at 20 mcg/min. No enteral access documented at this time. Per review of chart pt ate 100% of a [place] sandwich at 1:13 this AM.  Per Nephrology note EDW is 180 kg. Pt was documented to be 182.1 kg on 6/28 and 194.5 kg on 6/29. Unsure which weight is more accurate as suspect weight did not change that much in <24 hours. Might have been related to use of different scales. Recommend continuing to monitor trends.  Patient is currently intubated on ventilator support MV: 12.9 L/min Temp (24hrs), Avg:98.7 F (37.1 C), Min:97.5 F  (36.4 C), Max:102 F (38.9 C)  Medications reviewed and include: Precedex gtt, fentanyl gtt, ketamine gtt, norepinephrine gtt at 20 mcg/min, Zosyn, vancomycin  Labs reviewed: Sodium 131, Potassium 6.1, Chloride 95, CO2 20, BUN 51, Creatinine 10.01, Phosphorus 5.7  Enteral Access: none currently documented (though pt was just intubated)  UOP: 110 mL + 1 occurrence unmeasured UOP in previous 24 hours  I/O :-3610 mL since admission  NUTRITION - FOCUSED PHYSICAL EXAM:  Unable to complete at this time as RD is working remotely.  Diet Order:   Diet Order             Diet renal with fluid restriction Fluid restriction: 1200 mL Fluid; Room service appropriate? Yes; Fluid consistency: Thin  Diet effective now                  EDUCATION NEEDS:   No education needs have been identified at this time  Skin:  Skin Assessment: Reviewed RN Assessment  Last BM:  01/21/23 per chart  Height:   Ht Readings from Last 1 Encounters:  01/21/23 6' (1.829 m)   Weight:   Wt Readings from Last 1 Encounters:  01/22/23 (!) 194.5 kg   Ideal Body Weight:  80.9 kg  BMI:  Body mass index is 58.14 kg/m.  Estimated Nutritional Needs:   Kcal:  2400-2600  Protein:  140-160 grams  Fluid:  UOP + 1 L  Tenna Lacko Tollie Eth, MS, RD, LDN, CNSC Pager number available on Amion

## 2023-01-22 NOTE — Progress Notes (Signed)
50ml of ketamine wasted with Ferol Luz, RN

## 2023-01-22 NOTE — Progress Notes (Signed)
Received patient in chair to unit from ED Alert and oriented.  Informed consent signed and in chart.   TX duration: 3.5 hours  Patient tolerated well.  Transported back to the room  Alert, without acute distress.  Hand-off given to patient's nurse.   Access used: catheter Access issues: none  Total UF removed: Medication(s) given: none Post HD VS: 109/91 Post HD weight: 182.1 kg     01/22/23 0008  Vitals  Temp (!) 97.5 F (36.4 C)  Temp Source Oral  BP (!) 109/91  MAP (mmHg) 98  BP Location Right Arm  BP Method Automatic  Patient Position (if appropriate) Sitting  Pulse Rate (!) 143  Pulse Rate Source Monitor  ECG Heart Rate (!) 149  Resp (!) 36  Oxygen Therapy  SpO2 100 %  O2 Device HFNC  O2 Flow Rate (L/min) 15 L/min  Patient Activity (if Appropriate) In chair  Pulse Oximetry Type Continuous  Post Treatment  Dialyzer Clearance Lightly streaked  Duration of HD Treatment -hour(s) 3.5 hour(s)  Hemodialysis Intake (mL) 0 mL  Liters Processed 84.2  Fluid Removed (mL) 4000 mL  Tolerated HD Treatment Yes  Post-Hemodialysis Comments HD tx not completed as expected, pt requested to be off early due to cramping.  AVG/AVF Arterial Site Held (minutes) 0 minutes  AVG/AVF Venous Site Held (minutes) 0 minutes  Note  Observations pt is in chair, alert, oriented, verbally responsive, c/o SOB, o2 infusing at 12L/m  Hemodialysis Catheter Right Internal jugular Double lumen Permanent (Tunneled)  Placement Date/Time: 08/20/21 1324   Time Out: Correct patient;Correct site;Correct procedure  Maximum sterile barrier precautions: Hand hygiene;Cap;Mask;Sterile gown;Sterile gloves;Large sterile sheet  Site Prep: (c) Other (comment)  Local Anesthetic...  Site Condition No complications  Blue Lumen Status Flushed;Dead end cap in place (locked with sodium citrate.)  Red Lumen Status Flushed;Dead end cap in place (sodium citrate)  Purple Lumen Status N/A  Catheter fill solution  4% Sodium Citrate  Catheter fill volume (Arterial) 2.1 cc  Catheter fill volume (Venous) 2.1  Dressing Type Transparent  Dressing Status Antimicrobial disc in place  Drainage Description None  Post treatment catheter status Capped and Clamped

## 2023-01-22 NOTE — Progress Notes (Signed)
PT Cancellation Note  Patient Details Name: Phillip Young MRN: [id number] DOB: May 25, 1986   Cancelled Treatment:    Reason Eval/Treat Not Completed: Patient not medically ready. Pt transferred to ICU with worsening respiratory status. Will continue to monitor for medical readiness.    Angelina Ok [facility] 01/22/2023, 10:12 AM Skip Mayer PT Acute [employer] [phone]

## 2023-01-23 ENCOUNTER — Inpatient Hospital Stay (HOSPITAL_COMMUNITY): Payer: BC Managed Care – PPO

## 2023-01-23 DIAGNOSIS — L8990 Pressure ulcer of unspecified site, unspecified stage: Secondary | ICD-10-CM | POA: Insufficient documentation

## 2023-01-23 DIAGNOSIS — E1122 Type 2 diabetes mellitus with diabetic chronic kidney disease: Secondary | ICD-10-CM | POA: Diagnosis not present

## 2023-01-23 DIAGNOSIS — J9601 Acute respiratory failure with hypoxia: Secondary | ICD-10-CM | POA: Diagnosis not present

## 2023-01-23 DIAGNOSIS — E8770 Fluid overload, unspecified: Secondary | ICD-10-CM | POA: Diagnosis not present

## 2023-01-23 DIAGNOSIS — A419 Sepsis, unspecified organism: Secondary | ICD-10-CM | POA: Diagnosis not present

## 2023-01-23 DIAGNOSIS — N186 End stage renal disease: Secondary | ICD-10-CM | POA: Diagnosis not present

## 2023-01-23 DIAGNOSIS — Z992 Dependence on renal dialysis: Secondary | ICD-10-CM | POA: Diagnosis not present

## 2023-01-23 DIAGNOSIS — I4819 Other persistent atrial fibrillation: Secondary | ICD-10-CM | POA: Diagnosis not present

## 2023-01-23 LAB — RENAL FUNCTION PANEL
Albumin: 3.3 g/dL — ABNORMAL LOW (ref 3.5–5.0)
Albumin: 3.1 g/dL — ABNORMAL LOW (ref 3.5–5.0)
Anion gap: 16 — ABNORMAL HIGH (ref 5–15)
Anion gap: 16 — ABNORMAL HIGH (ref 5–15)
BUN: 44 mg/dL — ABNORMAL HIGH (ref 6–20)
BUN: 41 mg/dL — ABNORMAL HIGH (ref 6–20)
CO2: 21 mmol/L — ABNORMAL LOW (ref 22–32)
CO2: 20 mmol/L — ABNORMAL LOW (ref 22–32)
Calcium: 9 mg/dL (ref 8.9–10.3)
Calcium: 8.8 mg/dL — ABNORMAL LOW (ref 8.9–10.3)
Chloride: 95 mmol/L — ABNORMAL LOW (ref 98–111)
Chloride: 94 mmol/L — ABNORMAL LOW (ref 98–111)
Creatinine, Ser: 7.38 mg/dL — ABNORMAL HIGH (ref 0.61–1.24)
Creatinine, Ser: 6 mg/dL — ABNORMAL HIGH (ref 0.61–1.24)
GFR, Estimated: 9 mL/min — ABNORMAL LOW (ref >60)
GFR, Estimated: 12 mL/min — ABNORMAL LOW (ref >60)
Glucose, Bld: 113 mg/dL — ABNORMAL HIGH (ref 70–99)
Glucose, Bld: 113 mg/dL — ABNORMAL HIGH (ref 70–99)
Phosphorus: 5.7 mg/dL — ABNORMAL HIGH (ref 2.5–4.6)
Phosphorus: 6 mg/dL — ABNORMAL HIGH (ref 2.5–4.6)
Potassium: 4.9 mmol/L (ref 3.5–5.1)
Potassium: 4.7 mmol/L (ref 3.5–5.1)
Sodium: 132 mmol/L — ABNORMAL LOW (ref 135–145)
Sodium: 130 mmol/L — ABNORMAL LOW (ref 135–145)

## 2023-01-23 LAB — URINE CULTURE: Culture: NO GROWTH

## 2023-01-23 LAB — CBC
HCT: 31.1 % — ABNORMAL LOW (ref 39.0–52.0)
Hemoglobin: 10.2 g/dL — ABNORMAL LOW (ref 13.0–17.0)
MCH: 33 pg (ref 26.0–34.0)
MCHC: 32.8 g/dL (ref 30.0–36.0)
MCV: 100.6 fL — ABNORMAL HIGH (ref 80.0–100.0)
Platelets: 221 10*3/uL (ref 150–400)
RBC: 3.09 MIL/uL — ABNORMAL LOW (ref 4.22–5.81)
RDW: 16.8 % — ABNORMAL HIGH (ref 11.5–15.5)
WBC: 26.4 10*3/uL — ABNORMAL HIGH (ref 4.0–10.5)
nRBC: 0 % (ref 0.0–0.2)

## 2023-01-23 LAB — CULTURE, BLOOD (ROUTINE X 2): Culture: NO GROWTH

## 2023-01-23 LAB — MAGNESIUM: Magnesium: 2.2 mg/dL (ref 1.7–2.4)

## 2023-01-23 LAB — TRIGLYCERIDES: Triglycerides: 234 mg/dL — ABNORMAL HIGH (ref <150)

## 2023-01-23 LAB — GLUCOSE, CAPILLARY: Glucose-Capillary: 101 mg/dL — ABNORMAL HIGH (ref 70–99)

## 2023-01-23 MED ORDER — VITAL 1.5 CAL PO LIQD: 1000.0000 mL | ORAL | Status: DC

## 2023-01-23 MED ORDER — POLYVINYL ALCOHOL 1.4 % OP SOLN
1.0000 [drp] | OPHTHALMIC | Status: DC | PRN
  Administered 2023-01-24 – 2023-02-18 (×11): 1 [drp] via OPHTHALMIC
  Filled 2023-01-23: qty 15

## 2023-01-23 MED ORDER — SODIUM CHLORIDE 0.9 % IV SOLN
2.0000 g | INTRAVENOUS | Status: DC
  Administered 2023-01-23 – 2023-01-29 (×7): 2 g via INTRAVENOUS
  Filled 2023-01-23 (×7): qty 20

## 2023-01-23 MED ORDER — PANTOPRAZOLE SODIUM 40 MG IV SOLR
40.0000 mg | INTRAVENOUS | Status: DC
  Administered 2023-01-23 – 2023-02-09 (×18): 40 mg via INTRAVENOUS
  Filled 2023-01-23 (×15): qty 10

## 2023-01-23 MED ORDER — VITAL HIGH PROTEIN PO LIQD
1000.0000 mL | ORAL | Status: DC
  Administered 2023-01-23: 1000 mL

## 2023-01-23 MED ORDER — VITAL 1.5 CAL PO LIQD
1000.0000 mL | ORAL | Status: DC
  Administered 2023-01-23: 1000 mL

## 2023-01-23 NOTE — Progress Notes (Signed)
PT Cancellation Note  Patient Details Name: Phillip Young MRN: [id number] DOB: 06-Apr-1986   Cancelled Treatment:    Reason Eval/Treat Not Completed: Medical issues which prohibited therapy (pt with medical decline since order with intubation and on CRRT. will sign off and await new order, discussed with RN)   Enedina Finner [facility] 01/23/2023, 7:58 AM Merryl Hacker, PT Acute Rehabilitation Services Office: [phone]

## 2023-01-23 NOTE — Consult Note (Signed)
NAME:  Phillip Young, MRN:  [id number], DOB:  1986-04-23, LOS: 2 ADMISSION DATE:  01/21/2023, CONSULTATION DATE:  01/22/23 REFERRING MD:  Danise Edge, CHIEF COMPLAINT:  SOB   History of Present Illness:  37 year old man well known to our service w/ hx of ESRD, nonischemic cardiomyopathy, refractory afib, possible amio lung toxicity presenting from home with fevers, joint pains, SOB.  Missed HD on Thursday.  Has hx of leaving HD early.  Looks to be 10kg over dry weight if scales are accurate.  His breathing status is worsening, in afib/RVR, and BP borderline so coming to ICU for further management.  ROS as below.  Pertinent  Medical History   Past Medical History:  Diagnosis Date   Acute on chronic respiratory failure with hypoxia ([name]) 04/21/2021   Acute on chronic systolic (congestive) heart failure ([name]) 02/26/2020   Amiodarone toxicity    Anemia    Atrial flutter ([name])    Biventricular congestive heart failure ([name])    Chronic hypoxemic respiratory failure ([name])    Class 3 severe obesity due to excess calories with serious comorbidity and body mass index (BMI) of 50.0 to 59.9 in adult [facility]) 02/26/2020   Essential hypertension 02/26/2020   GERD without esophagitis 02/26/2020   Hidradenitis suppurativa 02/26/2020   NICM (nonischemic cardiomyopathy) ([name])    Obesity hypoventilation syndrome ([name])    OSA (obstructive sleep apnea)    PAF (paroxysmal atrial fibrillation) ([name])    Pneumonia    Prediabetes 02/26/2020     Significant Hospital Events: Including procedures, antibiotic start and stop dates in addition to other pertinent events   6/28 admit, emergent HD 6/29 Afib/RVR, shock, ICU transfer  Interim History / Subjective:  Patient became agitated, restless, he started desatting, became hypotensive, he was intubated and was started on vasopressor support with Levophed, was started on CRRT Still requiring vasopressor support, converted to sinus rhythm Remained afebrile On  CRRT  Objective   Blood pressure 106/71, pulse 64, temperature 98 F (36.7 C), temperature source Axillary, resp. rate (!) 24, height 6' (1.829 m), weight (!) 187.5 kg, SpO2 99 %.    Vent Mode: PRVC FiO2 (%):  [30 %-100 %] 40 % Set Rate:  [16 bmp-24 bmp] 24 bmp Vt Set:  [620 mL] 620 mL PEEP:  [8 cmH20] 8 cmH20 Pressure Support:  [5 cmH20-10 cmH20] 10 cmH20 Plateau Pressure:  [23 cmH20-32 cmH20] 31 cmH20   Intake/Output Summary (Last 24 hours) at 01/23/2023 4098 Last data filed at 01/23/2023 0900 Gross per 24 hour  Intake 2289.33 ml  Output 5379 ml  Net -3089.67 ml   Filed Weights   01/21/23 2352 01/22/23 0502 01/23/23 0413  Weight: (!) 182.1 kg (!) 194.5 kg (!) 187.5 kg    Examination:   Physical exam: General: Crtitically ill-appearing morbidly obese male, orally intubated HEENT: [place]/AT, eyes anicteric.  ETT and OGT in place Neuro: Sedated, not following commands.  Eyes are closed.  Pupils 3 mm bilateral reactive to light Chest: Reduced air entry at the bases bilaterally, no wheezes or rhonchi Heart: Regular rate and rhythm, no murmurs or gallops Abdomen: Soft, nondistended, bowel sounds present Skin: No rash  Labs and images were reviewed  Resolved Hospital Problem list   N/A  Assessment & Plan:  Acute on chronic hypoxemic respiratory failure, multifactorial including pneumonia and volume overload due to noncompliance with hemodialysis Patient came with shortness of breath, he was noted to be volume overloaded, he missed hemodialysis session but usually does not complete hemodialysis  He was noted to be volume overload X-ray chest suggestive of bilateral lower lobe pneumonia He was intubated Continue lung protective ventilation VAP prevention bundle in place PAD protocol with fentanyl and propofol with RASS goal -2 Try SBT once volume status improved  Septic shock due to pneumococcal pneumonia/bacteremia, POA Patient blood culture grew pneumococcal  pneumonia Sensitivities are pending X-ray chest is suggestive of bilateral lower lobe pneumonia, likely the source Continue vasopressor support with Levophed with map goal 65 Continue with dual coverage for pneumococcal pneumonia with ceftriaxone and vancomycin Once sensitivity results are back, we can stop vancomycin  Acute on chronic HFrEF Patient is with volume overload He is on CRRT for volume removal Advanced heart failure team is following  End-stage renal disease on hemodialysis Hypervolemic hyponatremia Hyperphosphatemia Hyperkalemia, improved High anion gap metabolic acidosis Appreciate nephrology consult Continue CRRT Closely monitor electrolytes Serum bicarbonate is improving Keep net negative fluid balance  Acute septic encephalopathy He was confused and restless when he came Currently on sedation, closely monitor  Hematuria POA- renal US pending He was noted to have's hematuria could be traumatic from Foley Renal ultrasound showed no hydronephrosis, consistent with medical renal disease H&H is stable, closely monitor  Morbid obesity Started on tube feeds Diet and exercise counseling as appropriate  Paroxysmal Afib/RVR Patient converted to sinus rhythm Heart rate remained in the 130s He has developed amiodarone toxicity prior On Eliquis for stroke prophylaxis  Best Practice (right click and "Reselect all SmartList Selections" daily)   Diet/type: Trickle tube feeds DVT prophylaxis: DOAC GI prophylaxis: Protonix Lines: Yes still needed Foley: Yes still needed Code Status:  full code Last date of multidisciplinary goals of care discussion [6/30: Patient mother was updated at bedside, decision was to continue full scope of care]  Labs   CBC: Recent Labs  Lab 01/21/23 1739 01/21/23 1748 01/21/23 1749 01/21/23 1842 01/22/23 0618 01/22/23 2014 01/23/23 0405  WBC 20.7*  --   --  21.6* 35.9*  --  26.4*  NEUTROABS 19.4*  --   --   --   --   --   --    HGB 10.5*   < > 11.9* 10.2* 10.4* 11.6* 10.2*  HCT 33.1*   < > 35.0* 31.9* 31.8* 34.0* 31.1*  MCV 100.0  --   --  102.6* 98.1  --  100.6*  PLT 192  --   --  208 196  --  221   < > = values in this interval not displayed.    Basic Metabolic Panel: Recent Labs  Lab 01/21/23 1739 01/21/23 1748 01/21/23 1749 01/21/23 1842 01/22/23 0618 01/22/23 1723 01/22/23 2014 01/23/23 0405  NA 132*   < > 132* 130* 131* 131* 130* 132*  K 5.4*   < > 5.8* 6.2* 6.1* 6.0* 5.5* 4.9  CL 97*  --  104 97* 95* 93*  --  95*  CO2 18*  --   --  16* 20* 20*  --  21*  GLUCOSE 91  --  85 78 141* 113*  --  113*  BUN 70*  --  78* 72* 51* 55*  --  44*  CREATININE 13.54*  --  14.00* 13.16* 10.01* 9.44*  --  7.38*  CALCIUM 8.9  --   --  8.7* 8.8* 8.6*  --  9.0  MG  --   --   --   --   --   --   --  2.2  PHOS  --   --   --  3.3 5.7* 5.7*  --  5.7*   < > = values in this interval not displayed.   GFR: Estimated Creatinine Clearance: 23.6 mL/min (A) (by C-G formula based on SCr of 7.38 mg/dL (H)). Recent Labs  Lab 01/21/23 1739 01/21/23 1804 01/21/23 1842 01/21/23 2308 01/22/23 0618 01/23/23 0405  PROCALCITON  --   --   --   --  >150.00  --   WBC 20.7*  --  21.6*  --  35.9* 26.4*  LATICACIDVEN  --  2.5*  --  2.4*  --   --     Liver Function Tests: Recent Labs  Lab 01/21/23 1739 01/21/23 1842 01/22/23 0618 01/22/23 1723 01/23/23 0405  AST 18  --   --   --   --   ALT 11  --   --   --   --   ALKPHOS 97  --   --   --   --   BILITOT 2.3*  --   --   --   --   PROT 7.6  --   --   --   --   ALBUMIN 3.4* 3.3* 3.4* 3.3* 3.3*   No results for input(s): "LIPASE", "AMYLASE" in the last 168 hours. No results for input(s): "AMMONIA" in the last 168 hours.  ABG    Component Value Date/Time   PHART 7.339 (L) 01/22/2023 2014   PCO2ART 39.8 01/22/2023 2014   PO2ART 113 (H) 01/22/2023 2014   HCO3 21.5 01/22/2023 2014   TCO2 23 01/22/2023 2014   ACIDBASEDEF 4.0 (H) 01/22/2023 2014   O2SAT 98 01/22/2023  2014     Coagulation Profile: No results for input(s): "INR", "PROTIME" in the last 168 hours.  Cardiac Enzymes: No results for input(s): "CKTOTAL", "CKMB", "CKMBINDEX", "TROPONINI" in the last 168 hours.  HbA1C: Hemoglobin A1C  Date/Time Value Ref Range Status  11/05/2022 11:44 AM 5.6 4.0 - 5.6 % Final   Hgb A1c MFr Bld  Date/Time Value Ref Range Status  03/26/2022 07:58 PM 5.3 4.8 - 5.6 % Final    Comment:    (NOTE) Pre diabetes:          5.7%-6.4%  Diabetes:              >6.4%  Glycemic control for   <7.0% adults with diabetes   08/19/2021 05:40 AM 5.9 (H) 4.8 - 5.6 % Final    Comment:    (NOTE)         Prediabetes: 5.7 - 6.4         Diabetes: >6.4         Glycemic control for adults with diabetes: <7.0     CBG: Recent Labs  Lab 01/22/23 1857 01/22/23 2011  GLUCAP 115* 107*    The patient is critically ill due to septic shock due to pneumococcal bacteremia, acute hypoxic respiratory failure.  Critical care was necessary to treat or prevent imminent or life-threatening deterioration.  Critical care was time spent personally by me on the following activities: development of treatment plan with patient and/or surrogate as well as nursing, discussions with consultants, evaluation of patient's response to treatment, examination of patient, obtaining history from patient or surrogate, ordering and performing treatments and interventions, ordering and review of laboratory studies, ordering and review of radiographic studies, pulse oximetry, re-evaluation of patient's condition and participation in multidisciplinary rounds.   During this encounter critical care time was devoted to patient care services described in this note for 37 minutes.  Jacky Kindle, MD [facility] See Amion for pager If no response to pager, please call [phone] until 7pm After 7pm, Please call E-link [phone]

## 2023-01-23 NOTE — Progress Notes (Signed)
[facility] Progress Note    Assessment/ Plan:   ESRD -outpatient orders: [facility], TTS. 4hrs 15 min. F180. Flow rates: 425/autoflow 1.5. 3k 2.5cal. EDW 180kg. Access: TDC. Meds: Mircers 200 mcg every two weeks (last dose 6/13), sensipar 60mg  three times per week, hectorol three times per week . Historically noncompliant with HD I.e. doesn't stay for full prescribed treatment times, high IDWG/does not adhere to fluid restriction -s/p emergent HD 6/28 given hypervolemia -Now on CRRT. Start 6/29. C/W CRRT, all 2K bags. UF goal net neg 100-150cc/hr. C/W CRRT today   AHRF -UF as tolerated. Possible PNA and volume overload-abx per primary service. Intubated 6/29. UFing as tolerated   Sepsis -s/p broad coverage in ER. Currently receiving zosyn and vanc. Cultures pending.  Shock -pressor support per CCM   Afib w/ RVR -on eliquis -per primary  -now controlled   Volume/ hypertension HFrEF -UF as tolerated   Anemia of Chronic Kidney Disease -hgb currently 10.2 -r/s ESA once Hgb <10 -transfuse PRN for Hgb <7   Secondary Hyperparathyroidism/Hyperphosphatemia -on hectorol, sensipar with HD but on hold given CRRT, watching for now   Hematuria -will start with checking renal ultrasound-pending report  Hyponatremia -managing with RRT, UF as tolerated  Subjective:   Patient seen and examined on CRRT. Intubated yesterday given increased WOB/tiring out. Tolerating CRRT currently. Net neg ~2.6L Pressors: levo Discussed with ICU RN.   Objective:   BP 106/71 (BP Location: Right Wrist)   Pulse 65   Temp 98 F (36.7 C) (Axillary)   Resp (!) 24   Ht 6' (1.829 m)   Wt (!) 187.5 kg   SpO2 100%   BMI 56.06 kg/m   Intake/Output Summary (Last 24 hours) at 01/23/2023 1610 Last data filed at 01/23/2023 0800 Gross per 24 hour  Intake 2224.96 ml  Output 5065 ml  Net -2840.04 ml   Weight change: 5.4 kg  Physical Exam: Gen: ill appearing, intubated/sedated CVS: irreg  irreg Resp: decreased breath sounds bibasilar, intubsted Abd: obese Ext: trace edema b/l Les Neuro: sedated Dialysis access: RIJ TDC in use  Imaging: DG Abd Portable 1V  Result Date: 01/22/2023 CLINICAL DATA:  Check gastric catheter placement EXAM: PORTABLE ABDOMEN - 1 VIEW COMPARISON:  None Available. FINDINGS: Gastric catheter is noted which extends into the stomach. The tip is noted in the midportion of the stomach. Cardiac shadow remains enlarged. No free air is seen. IMPRESSION: Gastric catheter within the stomach. Electronically Signed   By: Alcide Clever M.D.   On: 01/22/2023 19:54   DG CHEST PORT 1 VIEW  Result Date: 01/22/2023 CLINICAL DATA:  Central line placement.  Respiratory distress. EXAM: PORTABLE CHEST 1 VIEW COMPARISON:  01/22/2023 at 9:21 a.m. FINDINGS: Right IJ dialysis catheter unchanged. Left IJ central venous catheter with tip over the SVC. Lungs are adequately inflated with mild hazy prominence of the perihilar vessels. Worsening opacification over the left midlung and lung base suggesting effusion with associated atelectasis versus infection. No pneumothorax. Moderate stable cardiomegaly. Remainder of the exam is unchanged. IMPRESSION: 1. Worsening opacification over the left midlung and lung base suggesting effusion with associated atelectasis versus infection. 2. Mild hazy prominence of the perihilar vessels suggesting vascular congestion. 3. Left IJ central venous catheter with tip over the SVC. No pneumothorax. Electronically Signed   By: Elberta Fortis M.D.   On: 01/22/2023 15:48   DG CHEST PORT 1 VIEW  Result Date: 01/22/2023 CLINICAL DATA:  Intubation. EXAM: PORTABLE CHEST 1 VIEW COMPARISON:  01/22/2023  FINDINGS: Endotracheal tube has tip 3.5 cm above the carina. Left IJ central venous catheter unchanged. Right IJ dialysis catheter unchanged. Lungs are adequately inflated with no effusion or pneumothorax. Subtle hazy opacification of the perihilar vessels suggesting  minimal vascular congestion unchanged. Moderate stable cardiomegaly. Remainder of the exam is unchanged. IMPRESSION: 1. Minimal vascular congestion. 2. Moderate stable cardiomegaly. 3. Support apparatus as described. Electronically Signed   By: Elberta Fortis M.D.   On: 01/22/2023 14:52   DG CHEST PORT 1 VIEW  Result Date: 01/22/2023 CLINICAL DATA:  Respiratory distress.  History of AFib and CHF. EXAM: PORTABLE CHEST 1 VIEW COMPARISON:  01/21/2023 FINDINGS: Right IJ dialysis catheter unchanged with tip over the right atrium. Lungs are adequately inflated and demonstrate mild hazy perihilar opacification suggesting mild interstitial edema without significant change. Stable moderate cardiomegaly. Remainder of the exam is unchanged. IMPRESSION: 1. Mild hazy perihilar opacification suggesting mild interstitial edema without significant change. 2. Stable moderate cardiomegaly. 3. Right IJ dialysis catheter unchanged. Electronically Signed   By: Elberta Fortis M.D.   On: 01/22/2023 13:24   DG Chest Port 1 View  Result Date: 01/21/2023 CLINICAL DATA:  Short of breath, chest pain EXAM: PORTABLE CHEST 1 VIEW COMPARISON:  08/05/2022 FINDINGS: Two frontal views of the chest are obtained. Evaluation is limited by portable technique and patient body habitus. Right internal jugular catheter unchanged. Cardiac silhouette is enlarged but stable. Increased central vascular congestion without airspace disease, effusion, or pneumothorax. IMPRESSION: 1. Increased central vascular congestion without overt edema. 2. Stable enlarged cardiac silhouette. Electronically Signed   By: Sharlet Salina M.D.   On: 01/21/2023 18:22    Labs: BMET Recent Labs  Lab 01/21/23 1739 01/21/23 1748 01/21/23 1749 01/21/23 1842 01/22/23 0618 01/22/23 1723 01/22/23 2014 01/23/23 0405  NA 132* 132* 132* 130* 131* 131* 130* 132*  K 5.4* 5.8* 5.8* 6.2* 6.1* 6.0* 5.5* 4.9  CL 97*  --  104 97* 95* 93*  --  95*  CO2 18*  --   --  16* 20* 20*   --  21*  GLUCOSE 91  --  85 78 141* 113*  --  113*  BUN 70*  --  78* 72* 51* 55*  --  44*  CREATININE 13.54*  --  14.00* 13.16* 10.01* 9.44*  --  7.38*  CALCIUM 8.9  --   --  8.7* 8.8* 8.6*  --  9.0  PHOS  --   --   --  3.3 5.7* 5.7*  --  5.7*   CBC Recent Labs  Lab 01/21/23 1739 01/21/23 1748 01/21/23 1842 01/22/23 0618 01/22/23 2014 01/23/23 0405  WBC 20.7*  --  21.6* 35.9*  --  26.4*  NEUTROABS 19.4*  --   --   --   --   --   HGB 10.5*   < > 10.2* 10.4* 11.6* 10.2*  HCT 33.1*   < > 31.9* 31.8* 34.0* 31.1*  MCV 100.0  --  102.6* 98.1  --  100.6*  PLT 192  --  208 196  --  221   < > = values in this interval not displayed.    Medications:     apixaban  5 mg Per Tube BID   Chlorhexidine Gluconate Cloth  6 each Topical Q0600   lidocaine  1 patch Transdermal Q24H   melatonin  5 mg Per Tube QHS   mouth rinse  15 mL Mouth Rinse Q2H      Anthony Sar, MD [facility] 01/23/2023, 8:33 AM

## 2023-01-23 NOTE — Consult Note (Signed)
Advanced Heart Failure Team Consult Note   Primary Physician: Marrianne Mood, MD PCP-Cardiologist:  Marca Ancona, MD  Reason for Consultation: CHF  HPI:    Phillip Young is seen today for evaluation of CHF at the request of Dr. Shari Prows.   37 y.o. 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   Multiple admissions to [facility] and [facility] with acute on chronic hypoxic respiratory failure and volume overload + amiodarone induced lung toxicity for which he was treated with steroids.   Attempted AF ablation at [facility] 06/30/22. Discharged in SR.   Admitted to [facility] for a month (discharge 08/04/22) with volume overload requiring CRRT and recurrent AF with RVR. Apparently started on dronedadrone. Refused much of care at end of stay and presenting to [facility] 08/05/22 with volume overload and LE wounds. GDMT was limited by hypotension requiring midodrine. He was not felt to be a candidate for ICD given obesity and HD and not a candidate for advanced therapies. It was recommended to avoid dronederone due to CHF. He was felt to have no options for antiarrhythmic therapy (not a candidate for amiodarone with pulmonary toxicity and not a candidate for Tikosyn with ESRD). He was reported to be in NSR that admission. EKG 08/05/22 showed NSR; EKG 08/09/22 was suspicious for atrial flutter 2:1 vs sinus tach. Do not see had cardiac follow-up since that time. Repeat echo 11/2022 showed EF 20-25%, global HK, moderately dilated LV, moderately dilated LA, small pericardial effusion without significant change from prior.   He has been admitted again in 6/24 with dyspnea and fever to 102.  He had missed HD on Thursday. Nephrology note outlined that he is typically noncompliant with HD, does not stay for full treatment time. He was also found to have  leukocytosis up to 35.9K initially concerning for sepsis and is being treated with Zosyn/vancomycin for possible PNA.  He was in recurrent rapid AF.  Respiratory status worsened yesterday and he was intubated.  CVVH was started.  Today, he is back in NSR in 70s.  MAP stable on NE 10.    Review of Systems: All systems reviewed and negative except as per HPI.   Home Medications Prior to Admission medications   Medication Sig Start Date End Date Taking? Authorizing Provider  apixaban (ELIQUIS) 5 MG TABS tablet Take 1 tablet (5 mg total) by mouth 2 (two) times daily. 08/18/22   Steffanie Rainwater, MD  calamine lotion Apply 1 Application topically 2 (two) times daily. 08/13/22   Adron Bene, MD  camphor-menthol [facility]) lotion Apply topically as needed for itching. 08/13/22   Adron Bene, MD  clindamycin (CLINDAGEL) 1 % gel Apply topically 2 (two) times daily. Apply to hydradenitis wounds 08/13/22   Belva Agee, MD  fluticasone furoate-vilanterol (BREO ELLIPTA) 100-25 MCG/ACT AEPB INHALE 1 PUFF INTO THE LUNGS DAILY 10/13/22   Belva Agee, MD  lanthanum (FOSRENOL) 1000 MG chewable tablet Chew 2,000 mg by mouth 3 (three) times daily with meals.    [provider]  melatonin 5 MG TABS Take 1 tablet (5 mg total) by mouth at bedtime. 06/10/22   Crissie Sickles, MD  midodrine (PROAMATINE) 5 MG tablet TAKE 1 TABLET(5 MG) BY MOUTH THREE TIMES DAILY WITH MEALS 06/10/22   Katsadouros, Vasilios, MD  torsemide (DEMADEX) 100 MG tablet TAKE 1 TABLET BY MOUTH 3 TIMES A  WEEK ON NON-DIALYSIS DAYS. 08/18/22   Steffanie Rainwater, MD    Past Medical History: Past Medical History:  Diagnosis Date   Acute on chronic respiratory failure with hypoxia ([name]) 04/21/2021   Acute on chronic systolic (congestive) heart failure ([name]) 02/26/2020   Amiodarone toxicity    Anemia    Atrial flutter ([name])    Biventricular congestive heart failure ([name])    Chronic hypoxemic respiratory failure ([name])     Class 3 severe obesity due to excess calories with serious comorbidity and body mass index (BMI) of 50.0 to 59.9 in adult ([name]) 02/26/2020   Essential hypertension 02/26/2020   GERD without esophagitis 02/26/2020   Hidradenitis suppurativa 02/26/2020   NICM (nonischemic cardiomyopathy) ([name])    Obesity hypoventilation syndrome ([name])    OSA (obstructive sleep apnea)    PAF (paroxysmal atrial fibrillation) ([name])    Pneumonia    Prediabetes 02/26/2020    Past Surgical History: Past Surgical History:  Procedure Laterality Date   ABSCESS DRAINAGE     AV FISTULA PLACEMENT Left 08/21/2021   Procedure: LEFT ARM ARTERIOVENOUS (AV) FISTULA.;  Surgeon: Nada Libman, MD;  Location: [facility] OR;  Service: Vascular;  Laterality: Left;   CARDIAC CATHETERIZATION     CARDIOVERSION N/A 10/09/2021   Procedure: CARDIOVERSION;  Surgeon: Laurey Morale, MD;  Location: [facility] ENDOSCOPY;  Service: Cardiovascular;  Laterality: N/A;   CARDIOVERSION N/A 05/28/2022   Procedure: CARDIOVERSION;  Surgeon: Laurey Morale, MD;  Location: [facility] ENDOSCOPY;  Service: Cardiovascular;  Laterality: N/A;   CARDIOVERSION N/A 06/07/2022   Procedure: CARDIOVERSION;  Surgeon: Laurey Morale, MD;  Location: [facility] ENDOSCOPY;  Service: Cardiovascular;  Laterality: N/A;   IR FLUORO GUIDE CV LINE RIGHT  03/10/2021   IR FLUORO GUIDE CV LINE RIGHT  04/22/2021   IR FLUORO GUIDE CV LINE RIGHT  08/20/2021   IR US GUIDE VASC ACCESS RIGHT  03/10/2021   IR US GUIDE VASC ACCESS RIGHT  04/22/2021   RIGHT HEART CATH N/A 03/06/2021   Procedure: RIGHT HEART CATH;  Surgeon: Dolores Patty, MD;  Location: [facility] INVASIVE CV LAB;  Service: Cardiovascular;  Laterality: N/A;   RIGHT/LEFT HEART CATH AND CORONARY ANGIOGRAPHY N/A 03/04/2020   Procedure: RIGHT/LEFT HEART CATH AND CORONARY ANGIOGRAPHY;  Surgeon: Laurey Morale, MD;  Location: [facility] INVASIVE CV LAB;  Service: Cardiovascular;  Laterality: N/A;   TEE WITHOUT CARDIOVERSION N/A 05/05/2021    Procedure: TRANSESOPHAGEAL ECHOCARDIOGRAM (TEE);  Surgeon: Laurey Morale, MD;  Location: [facility] ENDOSCOPY;  Service: Cardiovascular;  Laterality: N/A;   TEMPORARY DIALYSIS CATHETER  03/06/2021   Procedure: TEMPORARY DIALYSIS CATHETER;  Surgeon: Dolores Patty, MD;  Location: [facility] INVASIVE CV LAB;  Service: Cardiovascular;;    Family History: Family History  Problem Relation Age of Onset   Heart disease Mother    Hypertension Mother    Pulmonary Hypertension Mother    Drug abuse Father        died due to Heroin overdose    Social History: Social History   Socioeconomic History   Marital status: Single    Spouse name: Not on file   Number of children: Not on file   Years of education: Not on file   Highest education level: Not on file  Occupational History   Not on file  Tobacco Use   Smoking status: Former    Packs/day: 1    Types: Cigarettes    Quit date: 2019    Years since quitting: 5.4  Passive exposure: Current   Smokeless tobacco: Former   Tobacco comments:    quit in 2019  Vaping Use   Vaping Use: Never used  Substance and Sexual Activity   Alcohol use: Never   Drug use: Never   Sexual activity: Not on file  Other Topics Concern   Not on file  Social History Narrative   Not on file   Social Determinants of Health   Financial Resource Strain: High Risk (09/21/2022)   Overall Financial Resource Strain (CARDIA)    Difficulty of Paying Living Expenses: Hard  Food Insecurity: No Food Insecurity (01/22/2023)   Hunger Vital Sign    Worried About Running Out of Food in the Last Year: Never true    Ran Out of Food in the Last Year: Never true  Transportation Needs: No Transportation Needs (01/22/2023)   PRAPARE - Administrator, Civil Service (Medical): No    Lack of Transportation (Non-Medical): No  Physical Activity: Inactive (09/21/2022)   Exercise Vital Sign    Days of Exercise per Week: 0 days    Minutes of Exercise per Session: 0 min   Stress: No Stress Concern Present (09/21/2022)   [employer] of Occupational Health - Occupational Stress Questionnaire    Feeling of Stress : Not at all  Social Connections: Unknown (09/21/2022)   Social Connection and Isolation Panel [NHANES]    Frequency of Communication with Friends and Family: More than three times a week    Frequency of Social Gatherings with Friends and Family: Never    Attends Religious Services: Not on Marketing executive or Organizations: Not on file    Attends Banker Meetings: Never    Marital Status: Never married    Allergies:  Allergies  Allergen Reactions   Amiodarone Other (See Comments)    Suspicion for amiodarone lung/hepatotoxicity  Other Reaction(s): Suspicion amiodarone toxicity   Coreg [Carvedilol] Shortness Of Breath and Diarrhea    Wheezing    Heparin Other (See Comments)    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, SRA positive   Metoprolol Other (See Comments)    near syncope    Objective:    Vital Signs:   Temp:  [97.8 F (36.6 C)-98 F (36.7 C)] 98 F (36.7 C) (06/30 0745) Pulse Rate:  [64-135] 64 (06/30 0900) Resp:  [20-42] 24 (06/30 0900) BP: (52-130)/(19-112) 106/71 (06/30 0800) SpO2:  [87 %-100 %] 99 % (06/30 0900) Arterial Line BP: (85-149)/(63-94) 109/67 (06/30 0900) FiO2 (%):  [30 %-100 %] 40 % (06/30 0734) Weight:  [187.5 kg] 187.5 kg (06/30 0413) Last BM Date : 01/21/23  Weight change: Filed Weights   01/21/23 2352 01/22/23 0502 01/23/23 0413  Weight: (!) 182.1 kg (!) 194.5 kg (!) 187.5 kg    Intake/Output:   Intake/Output Summary (Last 24 hours) at 01/23/2023 1001 Last data filed at 01/23/2023 0900 Gross per 24 hour  Intake 2289.33 ml  Output 5379 ml  Net -3089.67 ml      Physical Exam    General:  Intubated/sedated.  HEENT: normal Neck: supple. JVP difficult but appears elevated. Carotids 2+ bilat; no bruits. No lymphadenopathy or thyromegaly appreciated. Cor: PMI  nondisplaced. Regular rate & rhythm. No rubs, gallops or murmurs. Lungs: clear Abdomen: soft, nontender, nondistended. No hepatosplenomegaly. No bruits or masses. Good bowel sounds. Extremities: no cyanosis, clubbing, rash. 1+ edema to knees.  Neuro: alert & orientedx3, cranial nerves grossly intact. moves all 4 extremities w/o difficulty. Affect pleasant  Telemetry   NSR 70s (personally reviewed)  EKG    AF with RVR rate 144 (personally reviewed)  Labs   Basic Metabolic Panel: Recent Labs  Lab 01/21/23 1739 01/21/23 1748 01/21/23 1749 01/21/23 1842 01/22/23 0618 01/22/23 1723 01/22/23 2014 01/23/23 0405  NA 132*   < > 132* 130* 131* 131* 130* 132*  K 5.4*   < > 5.8* 6.2* 6.1* 6.0* 5.5* 4.9  CL 97*  --  104 97* 95* 93*  --  95*  CO2 18*  --   --  16* 20* 20*  --  21*  GLUCOSE 91  --  85 78 141* 113*  --  113*  BUN 70*  --  78* 72* 51* 55*  --  44*  CREATININE 13.54*  --  14.00* 13.16* 10.01* 9.44*  --  7.38*  CALCIUM 8.9  --   --  8.7* 8.8* 8.6*  --  9.0  MG  --   --   --   --   --   --   --  2.2  PHOS  --   --   --  3.3 5.7* 5.7*  --  5.7*   < > = values in this interval not displayed.    Liver Function Tests: Recent Labs  Lab 01/21/23 1739 01/21/23 1842 01/22/23 0618 01/22/23 1723 01/23/23 0405  AST 18  --   --   --   --   ALT 11  --   --   --   --   ALKPHOS 97  --   --   --   --   BILITOT 2.3*  --   --   --   --   PROT 7.6  --   --   --   --   ALBUMIN 3.4* 3.3* 3.4* 3.3* 3.3*   No results for input(s): "LIPASE", "AMYLASE" in the last 168 hours. No results for input(s): "AMMONIA" in the last 168 hours.  CBC: Recent Labs  Lab 01/21/23 1739 01/21/23 1748 01/21/23 1749 01/21/23 1842 01/22/23 0618 01/22/23 2014 01/23/23 0405  WBC 20.7*  --   --  21.6* 35.9*  --  26.4*  NEUTROABS 19.4*  --   --   --   --   --   --   HGB 10.5*   < > 11.9* 10.2* 10.4* 11.6* 10.2*  HCT 33.1*   < > 35.0* 31.9* 31.8* 34.0* 31.1*  MCV 100.0  --   --  102.6* 98.1  --   100.6*  PLT 192  --   --  208 196  --  221   < > = values in this interval not displayed.    Cardiac Enzymes: No results for input(s): "CKTOTAL", "CKMB", "CKMBINDEX", "TROPONINI" in the last 168 hours.  BNP: BNP (last 3 results) Recent Labs    05/22/22 0732 08/05/22 1720 01/21/23 1739  BNP 373.1* 15.5 1,528.6*    ProBNP (last 3 results) No results for input(s): "PROBNP" in the last 8760 hours.   CBG: Recent Labs  Lab 01/22/23 1857 01/22/23 2011  GLUCAP 115* 107*    Coagulation Studies: No results for input(s): "LABPROT", "INR" in the last 72 hours.   Imaging   DG Abd Portable 1V  Result Date: 01/22/2023 CLINICAL DATA:  Check gastric catheter placement EXAM: PORTABLE ABDOMEN - 1 VIEW COMPARISON:  None Available. FINDINGS: Gastric catheter is noted which extends into the stomach. The tip is noted in the midportion of the stomach. Cardiac shadow  remains enlarged. No free air is seen. IMPRESSION: Gastric catheter within the stomach. Electronically Signed   By: Alcide Clever M.D.   On: 01/22/2023 19:54   DG CHEST PORT 1 VIEW  Result Date: 01/22/2023 CLINICAL DATA:  Central line placement.  Respiratory distress. EXAM: PORTABLE CHEST 1 VIEW COMPARISON:  01/22/2023 at 9:21 a.m. FINDINGS: Right IJ dialysis catheter unchanged. Left IJ central venous catheter with tip over the SVC. Lungs are adequately inflated with mild hazy prominence of the perihilar vessels. Worsening opacification over the left midlung and lung base suggesting effusion with associated atelectasis versus infection. No pneumothorax. Moderate stable cardiomegaly. Remainder of the exam is unchanged. IMPRESSION: 1. Worsening opacification over the left midlung and lung base suggesting effusion with associated atelectasis versus infection. 2. Mild hazy prominence of the perihilar vessels suggesting vascular congestion. 3. Left IJ central venous catheter with tip over the SVC. No pneumothorax. Electronically Signed   By:  Elberta Fortis M.D.   On: 01/22/2023 15:48   DG CHEST PORT 1 VIEW  Result Date: 01/22/2023 CLINICAL DATA:  Intubation. EXAM: PORTABLE CHEST 1 VIEW COMPARISON:  01/22/2023 FINDINGS: Endotracheal tube has tip 3.5 cm above the carina. Left IJ central venous catheter unchanged. Right IJ dialysis catheter unchanged. Lungs are adequately inflated with no effusion or pneumothorax. Subtle hazy opacification of the perihilar vessels suggesting minimal vascular congestion unchanged. Moderate stable cardiomegaly. Remainder of the exam is unchanged. IMPRESSION: 1. Minimal vascular congestion. 2. Moderate stable cardiomegaly. 3. Support apparatus as described. Electronically Signed   By: Elberta Fortis M.D.   On: 01/22/2023 14:52     Medications:     Current Medications:  apixaban  5 mg Per Tube BID   Chlorhexidine Gluconate Cloth  6 each Topical Q0600   lidocaine  1 patch Transdermal Q24H   melatonin  5 mg Per Tube QHS   mouth rinse  15 mL Mouth Rinse Q2H    Infusions:  sodium chloride Stopped (01/22/23 1859)   cefTRIAXone (ROCEPHIN)  IV     dexmedetomidine (PRECEDEX) IV infusion Stopped (01/22/23 1320)   fentaNYL infusion INTRAVENOUS 200 mcg/hr (01/23/23 0907)   norepinephrine (LEVOPHED) Adult infusion 8 mcg/min (01/23/23 0900)   prismasol BGK 2/2.5 dialysis solution 1,500 mL/hr at 01/23/23 0748   prismasol BGK 2/2.5 replacement solution 500 mL/hr at 01/23/23 0050   prismasol BGK 2/2.5 replacement solution 400 mL/hr at 01/23/23 0334   propofol (DIPRIVAN) infusion 30 mcg/kg/min (01/23/23 0900)   vancomycin         Assessment/Plan   1.  Acute on chronic systolic CHF: Nonischemic CMP, LHC 2021 with no CAD. Previous hospitalizations w/shock in setting of rapid AFib/Flutter requiring milrinone. Most recent echo 5/24 was unchanged with EF 20-25%, RV difficult to visualize.  He signs off early from HD and is significantly volume overloaded this admission.  Suspect combination cardiogenic and septic  shock at this time, currently on NE 10.  - CVVH for volume removal, continue UF net 150 cc/hr.  - Set up CVP, check co-ox.  - Continue NE as needed for MAP.   - Not candidate for ICD given obesity and dialysis  - Not candidate for advanced therapies.  2. Atrial flutter/fibrillation: History of multiple DCCVs. Amiodarone previously stopped due to elevated LFTs. Restarted 05/20/22 by Dr. Elberta Fortis.  Concern for possible amiodarone pulmonary toxicity w/ elevated ESR and CRP 11/23. He has been off amiodarone and was treated with steroids.  AF ablation at [facility] in 12/23. He was readmitted with AF/RVR but  is now back in NSR.  - Not candidate for amiodarone (lung and hepatic toxicity) or Tikosyn (ESRD), no anti-arrhythmic option.   - Continue Eliquis 5 mg bid  3. Possible amiodarone toxicity: Seen by pulmonary during admit 11/23. Had elevated ESR and sed rate. Treated with prednisone.   4. ESRD: HD per nephrology, on T,Th,Sat schedule.  Poor compliance with chronic treatment (signs off early).  - Significantly volume overloaded - CVVH, continue net UF 150 cc/hr.  5. ID: Fever at admission, no afebrile.  Cultures NGTD.  WBCs 26.  Concern for PNA.  - On vancomycin/Zosyn.  6. OHS/OSA: Has been on CPAP and home oxygen.  7. Lower extremity wounds: - Per primary  - Wound consulted 8. Acute on chronic hypoxemic respiratory failure: Baseline OHS/OSA, now suspect possible PNA and CHF/volume overload. Intubated.  - Antibiotics as above.  - CVVH.   Poor long-term prognosis.   Length of Stay: 2  Marca Ancona, MD  01/23/2023, 10:01 AM  Advanced Heart Failure Team Pager [phone] (M-F; 7a - 5p)  Please contact [facility] for night-coverage after hours (4p -7a ) and weekends on [email or web address]

## 2023-01-24 DIAGNOSIS — I5023 Acute on chronic systolic (congestive) heart failure: Secondary | ICD-10-CM | POA: Diagnosis not present

## 2023-01-24 DIAGNOSIS — R7881 Bacteremia: Secondary | ICD-10-CM | POA: Diagnosis not present

## 2023-01-24 DIAGNOSIS — J13 Pneumonia due to Streptococcus pneumoniae: Secondary | ICD-10-CM | POA: Diagnosis not present

## 2023-01-24 DIAGNOSIS — Z9911 Dependence on respirator [ventilator] status: Secondary | ICD-10-CM

## 2023-01-24 DIAGNOSIS — B953 Streptococcus pneumoniae as the cause of diseases classified elsewhere: Secondary | ICD-10-CM

## 2023-01-24 DIAGNOSIS — J9601 Acute respiratory failure with hypoxia: Secondary | ICD-10-CM | POA: Diagnosis not present

## 2023-01-24 LAB — COOXEMETRY PANEL
Carboxyhemoglobin: 2.4 % — ABNORMAL HIGH (ref 0.5–1.5)
Methemoglobin: 1.1 % (ref 0.0–1.5)
O2 Saturation: 63.4 %
Total hemoglobin: 10.3 g/dL — ABNORMAL LOW (ref 12.0–16.0)

## 2023-01-24 LAB — RENAL FUNCTION PANEL
Albumin: 3.2 g/dL — ABNORMAL LOW (ref 3.5–5.0)
Albumin: 3.1 g/dL — ABNORMAL LOW (ref 3.5–5.0)
Anion gap: 19 — ABNORMAL HIGH (ref 5–15)
Anion gap: 15 (ref 5–15)
BUN: 36 mg/dL — ABNORMAL HIGH (ref 6–20)
BUN: 31 mg/dL — ABNORMAL HIGH (ref 6–20)
CO2: 22 mmol/L (ref 22–32)
CO2: 20 mmol/L — ABNORMAL LOW (ref 22–32)
Calcium: 9.3 mg/dL (ref 8.9–10.3)
Calcium: 9 mg/dL (ref 8.9–10.3)
Chloride: 92 mmol/L — ABNORMAL LOW (ref 98–111)
Chloride: 97 mmol/L — ABNORMAL LOW (ref 98–111)
Creatinine, Ser: 4.76 mg/dL — ABNORMAL HIGH (ref 0.61–1.24)
Creatinine, Ser: 4.02 mg/dL — ABNORMAL HIGH (ref 0.61–1.24)
GFR, Estimated: 15 mL/min — ABNORMAL LOW (ref >60)
GFR, Estimated: 19 mL/min — ABNORMAL LOW (ref >60)
Glucose, Bld: 143 mg/dL — ABNORMAL HIGH (ref 70–99)
Glucose, Bld: 140 mg/dL — ABNORMAL HIGH (ref 70–99)
Phosphorus: 5.4 mg/dL — ABNORMAL HIGH (ref 2.5–4.6)
Phosphorus: 4.5 mg/dL (ref 2.5–4.6)
Potassium: 4.2 mmol/L (ref 3.5–5.1)
Potassium: 3.9 mmol/L (ref 3.5–5.1)
Sodium: 133 mmol/L — ABNORMAL LOW (ref 135–145)
Sodium: 132 mmol/L — ABNORMAL LOW (ref 135–145)

## 2023-01-24 LAB — RESPIRATORY PANEL BY PCR

## 2023-01-24 LAB — CBC
HCT: 29.6 % — ABNORMAL LOW (ref 39.0–52.0)
Hemoglobin: 9.7 g/dL — ABNORMAL LOW (ref 13.0–17.0)
MCH: 32.3 pg (ref 26.0–34.0)
MCHC: 32.8 g/dL (ref 30.0–36.0)
MCV: 98.7 fL (ref 80.0–100.0)
Platelets: 240 10*3/uL (ref 150–400)
RBC: 3 MIL/uL — ABNORMAL LOW (ref 4.22–5.81)
RDW: 16.2 % — ABNORMAL HIGH (ref 11.5–15.5)
WBC: 20.1 10*3/uL — ABNORMAL HIGH (ref 4.0–10.5)
nRBC: 0 % (ref 0.0–0.2)

## 2023-01-24 LAB — CULTURE, BLOOD (ROUTINE X 2): Special Requests: ADEQUATE

## 2023-01-24 LAB — GLUCOSE, CAPILLARY
Glucose-Capillary: 99 mg/dL (ref 70–99)
Glucose-Capillary: 115 mg/dL — ABNORMAL HIGH (ref 70–99)
Glucose-Capillary: 129 mg/dL — ABNORMAL HIGH (ref 70–99)
Glucose-Capillary: 130 mg/dL — ABNORMAL HIGH (ref 70–99)
Glucose-Capillary: 122 mg/dL — ABNORMAL HIGH (ref 70–99)
Glucose-Capillary: 144 mg/dL — ABNORMAL HIGH (ref 70–99)
Glucose-Capillary: 134 mg/dL — ABNORMAL HIGH (ref 70–99)
Glucose-Capillary: 95 mg/dL (ref 70–99)

## 2023-01-24 LAB — MAGNESIUM: Magnesium: 2.6 mg/dL — ABNORMAL HIGH (ref 1.7–2.4)

## 2023-01-24 MED ORDER — VITAL 1.5 CAL PO LIQD
1000.0000 mL | ORAL | Status: DC
  Administered 2023-01-24 – 2023-01-25 (×2): 1000 mL

## 2023-01-24 MED ORDER — MIDODRINE HCL 5 MG PO TABS
5.0000 mg | ORAL_TABLET | Freq: Three times a day (TID) | ORAL | Status: DC
  Administered 2023-01-24 – 2023-01-25 (×4): 5 mg
  Filled 2023-01-24 (×2): qty 1

## 2023-01-24 MED ORDER — DARBEPOETIN ALFA 100 MCG/0.5ML IJ SOSY
100.0000 ug | PREFILLED_SYRINGE | INTRAMUSCULAR | Status: DC
  Administered 2023-01-24 – 2023-01-31 (×2): 100 ug via SUBCUTANEOUS
  Filled 2023-01-24 (×2): qty 0.5

## 2023-01-24 NOTE — Progress Notes (Signed)
[facility] NEPHROLOGY PROGRESS NOTE  Assessment/ Plan: Pt is a 37 y.o. yo male chronic CHF, PAF, morbid obesity, OSA, ESRD on HD with pneumonia and CHF exacerbation.  OP HD orders: [facility], TTS. 4hrs 15 min. F180. Flow rates: 425/autoflow 1.5. 3k 2.5cal. EDW 180kg. Access: TDC. Meds: Mircers 200 mcg every two weeks (last dose 6/13), sensipar 60mg  three times per week, hectorol three times per week . Historically noncompliant with HD I.e. doesn't stay for full prescribed treatment times, high IDWG/does not adhere to fluid restriction   # ESRD: Required emergent HD on 6/29 because of fluid overload.  He is on Levophed for hypotension/shock.  Started CRRT on 6/29 with UF goal 100-150 cc an hour as tolerated by BP.  All 2K bags.  # Acute on chronic systolic CHF, nonischemic: EF 16-10%.  Optimize volume with CRRT.  Discussed with the cardiology team.  Not a candidate for ICD and advanced therapies for CHF because of obesity and dialysis.  # Sepsis/septic shock: Currently on broad-spectrum antibiotics, per primary team.  Levophed as a pressor.  # Anemia of CKD: Hemoglobin trending down.  I will dose ESA today.  Subjective: Seen and examined in ICU.  He is intubated, sedated.  On pressure, Levophed with soft BP.  Running CRRT well with UF around 100-150 cc an hour.  Discussed with ICU nurse. Objective Vital signs in last 24 hours: Vitals:   01/24/23 0830 01/24/23 0845 01/24/23 0900 01/24/23 0915  BP:      Pulse: 94 100 96 95  Resp: (!) 24 (!) 24 (!) 24 (!) 24  Temp:      TempSrc:      SpO2: 100% 100% 100% 100%  Weight:      Height:       Weight change: -1.3 kg  Intake/Output Summary (Last 24 hours) at 01/24/2023 1041 Last data filed at 01/24/2023 1000 Gross per 24 hour  Intake 2598.61 ml  Output 5883.9 ml  Net -3285.29 ml       Labs: RENAL PANEL Recent Labs  Lab 01/22/23 0618 01/22/23 1723 01/22/23 2014 01/23/23 0405 01/23/23 1511 01/24/23 0419  NA 131* 131*  130* 132* 130* 133*  K 6.1* 6.0* 5.5* 4.9 4.7 4.2  CL 95* 93*  --  95* 94* 92*  CO2 20* 20*  --  21* 20* 22  GLUCOSE 141* 113*  --  113* 113* 143*  BUN 51* 55*  --  44* 41* 36*  CREATININE 10.01* 9.44*  --  7.38* 6.00* 4.76*  CALCIUM 8.8* 8.6*  --  9.0 8.8* 9.3  MG  --   --   --  2.2  --  2.6*  PHOS 5.7* 5.7*  --  5.7* 6.0* 5.4*  ALBUMIN 3.4* 3.3*  --  3.3* 3.1* 3.2*    Liver Function Tests: Recent Labs  Lab 01/21/23 1739 01/21/23 1842 01/23/23 0405 01/23/23 1511 01/24/23 0419  AST 18  --   --   --   --   ALT 11  --   --   --   --   ALKPHOS 97  --   --   --   --   BILITOT 2.3*  --   --   --   --   PROT 7.6  --   --   --   --   ALBUMIN 3.4*   < > 3.3* 3.1* 3.2*   < > = values in this interval not displayed.   No results for  input(s): "LIPASE", "AMYLASE" in the last 168 hours. No results for input(s): "AMMONIA" in the last 168 hours. CBC: Recent Labs    03/26/22 1958 03/27/22 0300 06/04/22 1924 06/05/22 0154 08/09/22 0042 08/10/22 0107 01/21/23 1842 01/22/23 0618 01/22/23 2014 01/23/23 0405 01/24/23 0419  HGB  --    < >  --    < > 6.7*   < > 10.2* 10.4* 11.6* 10.2* 9.7*  MCV  --    < >  --    < > 106.1*   < > 102.6* 98.1  --  100.6* 98.7  VITAMINB12 297  --  5,998*  --   --   --   --   --   --   --   --   FOLATE  --   --  15.7  --   --   --   --   --   --   --   --   FERRITIN 370*  --   --   --  156  --   --   --   --   --   --   TIBC 321  --   --   --  330  --   --   --   --   --   --   IRON 27*  --   --   --  28*  --   --   --   --   --   --    < > = values in this interval not displayed.    Cardiac Enzymes: No results for input(s): "CKTOTAL", "CKMB", "CKMBINDEX", "TROPONINI" in the last 168 hours. CBG: Recent Labs  Lab 01/23/23 1520 01/23/23 1942 01/23/23 2328 01/24/23 0320 01/24/23 0737  GLUCAP 101* 115* 129* 130* 122*    Iron Studies: No results for input(s): "IRON", "TIBC", "TRANSFERRIN", "FERRITIN" in the last 72 hours. Studies/Results: US  RENAL  Result Date: 01/23/2023 CLINICAL DATA:  Hematuria EXAM: RENAL / URINARY TRACT ULTRASOUND COMPLETE COMPARISON:  02/13/2021 FINDINGS: Right Kidney: Renal measurements: 11.5 x 5.7 x 6.5 cm = volume: 223 mL. Parenchyma echogenic compared to adjacent liver. No mass or hydronephrosis. Left Kidney: Renal measurements: 9.9 x 5.3 x 6.2 cm = volume: 168 mL. No mass or hydronephrosis visualized. Bladder: Not visualized Other: Technologist describes technically difficult study secondary to large body habitus. IMPRESSION: 1. No hydronephrosis or other acute findings. 2. Echogenic renal parenchyma suggesting medical renal disease. Electronically Signed   By: Corlis Leak M.D.   On: 01/23/2023 10:34   DG Abd Portable 1V  Result Date: 01/22/2023 CLINICAL DATA:  Check gastric catheter placement EXAM: PORTABLE ABDOMEN - 1 VIEW COMPARISON:  None Available. FINDINGS: Gastric catheter is noted which extends into the stomach. The tip is noted in the midportion of the stomach. Cardiac shadow remains enlarged. No free air is seen. IMPRESSION: Gastric catheter within the stomach. Electronically Signed   By: Alcide Clever M.D.   On: 01/22/2023 19:54   DG CHEST PORT 1 VIEW  Result Date: 01/22/2023 CLINICAL DATA:  Central line placement.  Respiratory distress. EXAM: PORTABLE CHEST 1 VIEW COMPARISON:  01/22/2023 at 9:21 a.m. FINDINGS: Right IJ dialysis catheter unchanged. Left IJ central venous catheter with tip over the SVC. Lungs are adequately inflated with mild hazy prominence of the perihilar vessels. Worsening opacification over the left midlung and lung base suggesting effusion with associated atelectasis versus infection. No pneumothorax. Moderate stable cardiomegaly. Remainder of the exam is unchanged. IMPRESSION: 1.  Worsening opacification over the left midlung and lung base suggesting effusion with associated atelectasis versus infection. 2. Mild hazy prominence of the perihilar vessels suggesting vascular congestion.  3. Left IJ central venous catheter with tip over the SVC. No pneumothorax. Electronically Signed   By: Elberta Fortis M.D.   On: 01/22/2023 15:48   DG CHEST PORT 1 VIEW  Result Date: 01/22/2023 CLINICAL DATA:  Intubation. EXAM: PORTABLE CHEST 1 VIEW COMPARISON:  01/22/2023 FINDINGS: Endotracheal tube has tip 3.5 cm above the carina. Left IJ central venous catheter unchanged. Right IJ dialysis catheter unchanged. Lungs are adequately inflated with no effusion or pneumothorax. Subtle hazy opacification of the perihilar vessels suggesting minimal vascular congestion unchanged. Moderate stable cardiomegaly. Remainder of the exam is unchanged. IMPRESSION: 1. Minimal vascular congestion. 2. Moderate stable cardiomegaly. 3. Support apparatus as described. Electronically Signed   By: Elberta Fortis M.D.   On: 01/22/2023 14:52    Medications: Infusions:  sodium chloride Stopped (01/22/23 1859)   cefTRIAXone (ROCEPHIN)  IV Stopped (01/24/23 0920)   feeding supplement (VITAL 1.5 CAL) 10 mL/hr at 01/24/23 1000   fentaNYL infusion INTRAVENOUS 225 mcg/hr (01/24/23 1000)   norepinephrine (LEVOPHED) Adult infusion 4 mcg/min (01/24/23 1000)   prismasol BGK 2/2.5 dialysis solution 1,500 mL/hr at 01/24/23 1022   prismasol BGK 2/2.5 replacement solution 500 mL/hr at 01/24/23 0654   prismasol BGK 2/2.5 replacement solution 400 mL/hr at 01/24/23 0413   propofol (DIPRIVAN) infusion 30 mcg/kg/min (01/24/23 1000)    Scheduled Medications:  apixaban  5 mg Per Tube BID   Chlorhexidine Gluconate Cloth  6 each Topical Q0600   lidocaine  1 patch Transdermal Q24H   melatonin  5 mg Per Tube QHS   midodrine  5 mg Per Tube Q8H   mouth rinse  15 mL Mouth Rinse Q2H   pantoprazole (PROTONIX) IV  40 mg Intravenous Q24H    have reviewed scheduled and prn medications.  Physical Exam: General: Intubated, sedated, ill looking. Heart:RRR, s1s2 nl Lungs: Coarse breath sound bilateral Abdomen:soft, Non-tender,  non-distended Extremities: Bilateral leg edema presents Dialysis Access: RIJ TDC in place.  Site clean.  Anahy Esh Prasad Gabbrielle Mcnicholas 01/24/2023,10:41 AM  LOS: 3 days

## 2023-01-24 NOTE — Progress Notes (Addendum)
OT Cancellation Note  Patient Details Name: Phillip Young MRN: [id number] DOB: November 14, 1985   Cancelled Treatment:    Reason Eval/Treat Not Completed: Medical issues which prohibited therapy (currently intubated, per RN with possible plan to extubate today, will follow up for OT evaluation as appropriate)  Carver Fila, OTD, OTR/L [facility] [phone]   [name] 01/24/2023, 8:11 AM

## 2023-01-24 NOTE — Progress Notes (Signed)
NAME:  Phillip Young, MRN:  [id number], DOB:  [birth year], LOS: 3 ADMISSION DATE:  01/21/2023, CONSULTATION DATE:  01/22/23 REFERRING MD:  Danise Edge, CHIEF COMPLAINT:  SOB   History of Present Illness:  37 year old man well known to our service w/ hx of ESRD, nonischemic cardiomyopathy, refractory afib, possible amio lung toxicity presenting from home with fevers, joint pains, SOB.  Missed HD on Thursday.  Has hx of leaving HD early.  Looks to be 10kg over dry weight if scales are accurate.  His breathing status is worsening, in afib/RVR, and BP borderline so coming to ICU for further management.  ROS as below.  Pertinent  Medical History   Past Medical History:  Diagnosis Date   Acute on chronic respiratory failure with hypoxia ([name]) 04/21/2021   Acute on chronic systolic (congestive) heart failure ([name]) 02/26/2020   Amiodarone toxicity    Anemia    Atrial flutter ([name])    Biventricular congestive heart failure ([name])    Chronic hypoxemic respiratory failure ([name])    Class 3 severe obesity due to excess calories with serious comorbidity and body mass index (BMI) of 50.0 to 59.9 in adult [facility]) 02/26/2020   Essential hypertension 02/26/2020   GERD without esophagitis 02/26/2020   Hidradenitis suppurativa 02/26/2020   NICM (nonischemic cardiomyopathy) ([name])    Obesity hypoventilation syndrome ([name])    OSA (obstructive sleep apnea)    PAF (paroxysmal atrial fibrillation) ([name])    Pneumonia    Prediabetes 02/26/2020   Significant Hospital Events: Including procedures, antibiotic start and stop dates in addition to other pertinent events   6/28 admit, emergent HD 6/29 Afib/RVR, shock, ICU transfer 6/30 intubated and placed on vasopressor support and started on CRRT 7/1 remains intubated on CRRT continue with pressor support  Interim History / Subjective:  Sedated on ventilator, did not tolerate SBT due to significant agitation with lightened sedation  Objective   Blood pressure 111/68,  pulse (!) 105, temperature 97.7 F (36.5 C), temperature source Oral, resp. rate (!) 24, height 6' (1.829 m), weight (!) 186.2 kg, SpO2 100 %. CVP:  [8 mmHg-17 mmHg] 11 mmHg  Vent Mode: PRVC FiO2 (%):  [40 %] 40 % Set Rate:  [24 bmp] 24 bmp Vt Set:  [409 mL] 620 mL PEEP:  [5 cmH20-8 cmH20] 5 cmH20 Plateau Pressure:  [27 cmH20-31 cmH20] 27 cmH20   Intake/Output Summary (Last 24 hours) at 01/24/2023 0803 Last data filed at 01/24/2023 0700 Gross per 24 hour  Intake 2368.95 ml  Output 5749 ml  Net -3380.05 ml    Filed Weights   01/22/23 0502 01/23/23 0413 01/24/23 0406  Weight: (!) 194.5 kg (!) 187.5 kg (!) 186.2 kg    Examination: General: Acute on chronic ill-appearing adult male lying in bed on mechanical ventilation in no acute distress HEENT: [place]/AT, MM pink/moist, PERRL,  Neuro: Sedated on ventilator, able to move all extremities per nurse when sedation lightened CV: s1s2 regular rate and rhythm, no murmur, rubs, or gallops,  PULM: Clear to auscultation bilaterally, no increased work of breathing, no added breath sounds GI: soft, bowel sounds active in all 4 quadrants, non-tender, non-distended, tolerating TF Extremities: warm/dry, generalized nonpitting edema  Skin: no rashes or lesions'   Resolved Hospital Problem list   N/A  Assessment & Plan:  Acute on chronic hypoxemic respiratory failure, multifactorial including pneumonia and volume overload due to noncompliance with hemodialysis -Patient came with shortness of breath, he was noted to be volume overloaded, he missed hemodialysis  session but usually does not complete hemodialysis -He was noted to be volume overload with X-ray chest suggestive of bilateral lower lobe pneumonia History of OHS/OSA P: Continue ventilator support with lung protective strategies  Wean PEEP and FiO2 for sats greater than 90%. Head of bed elevated 30 degrees. Plateau pressures less than 30 cm H20.  Follow intermittent chest x-ray and ABG.    SAT/SBT as tolerated, mentation preclude extubation  Ensure adequate pulmonary hygiene  Follow cultures  VAP bundle in place  PAD protocol  Septic shock due to pneumococcal pneumonia/bacteremia, POA -Patient blood culture grew pneumococcal pneumonia likely secondary to bilateral lower lobe pneumonia. Sensitivities are pending P: Ventilator support as above Continue empiric ceftriaxone and vancomycin Follow transfer sensitivity, de-escalate antibiotics as able Continue pressors for MAP< 65 ( Monitor urine output  Acute on chronic HFrEF Nonischemic cardiomyopathy -Echocardiogram 5/24 with unchanged EF of 20 to 25% History of paroxysmal rapid A-fib/flutter with history of possible amiodarone toxicity treated with steroids -History of multiple DCCV's with history of amiodarone toxicity P: Heart failure following, appreciate assistance Continuous telemetry Strict intake and output  Daily weight to assess volume status Daily assessment for need to diurese Closely monitor renal function and electrolytes  Volume removal per CRRT as below Pressor support as above Per heart failure not a candidate for advanced therapies Continue Eliquis  End-stage renal disease on hemodialysis Hypervolemic hyponatremia -improving Hyperphosphatemia Hyperkalemia - improved High anion gap metabolic acidosis -On HD T/Th/Sa but is often noncompliant with sessions P: Nephrology following, appreciate assistance Volume removal per CRRT Follow renal function  Trend Bmet Avoid nephrotoxins Ensure adequate renal perfusion   Acute septic encephalopathy He was confused and restless when he came P: Maintain neuro protective measures; goal for eurothermia, euglycemia, eunatermia, normoxia, and PCO2 goal of 35-40 Nutrition and bowel regiment  Seizure precautions  Aspirations precautions  Minimize sedation  Hematuria POA -No hydronephrosis, medical renal disease seen on renal ultrasound -Possible  traumatic Foley insertion P: Remove Foley today  Morbid obesity P: Continue tube feeds RD following Protein supplementation  Goals of care Mother updated at bedside 6/30 with decision to continue with aggressive interventions, given multisystem organ involvement with medication and medical noncompliance needs ongoing goals of care discussions  Best Practice (right click and "Reselect all SmartList Selections" daily)   Diet/type: Trickle tube feeds DVT prophylaxis: DOAC GI prophylaxis: Protonix Lines: Yes still needed Foley: Yes still needed Code Status:  full code Last date of multidisciplinary goals of care discussion no family at bedside this a.m., will continue to update daily   CRITICAL CARE Performed by: Frances Joynt D. Harris  Total critical care time: 40 minutes  Critical care time was exclusive of separately billable procedures and treating other patients.  Critical care was necessary to treat or prevent imminent or life-threatening deterioration.  Critical care was time spent personally by me on the following activities: development of treatment plan with patient and/or surrogate as well as nursing, discussions with consultants, evaluation of patient's response to treatment, examination of patient, obtaining history from patient or surrogate, ordering and performing treatments and interventions, ordering and review of laboratory studies, ordering and review of radiographic studies, pulse oximetry and re-evaluation of patient's condition.  Raiquan Chandler D. Harris, NP-C [facility] Personal contact information can be found on Amion  If no contact or response made please call [phone] 01/24/2023, 8:15 AM

## 2023-01-24 NOTE — Progress Notes (Addendum)
Advanced Heart Failure Rounding Note  PCP-Cardiologist: Marca Ancona, MD   Subjective:    Remains intubated and sedated.   BCx + for strep pneumo. On Vanc + ceftriaxone.  Afebrile. WBC 26>>20K   On CVVHD, currently pulling 150cc/hr. 6L volume removal yesterday. CVP 13   On NE 3.   Maintaining NSR.   Objective:   Weight Range: (!) 186.2 kg Body mass index is 55.67 kg/m.   Vital Signs:   Temp:  [97.4 F (36.3 C)-97.9 F (36.6 C)] 97.7 F (36.5 C) (07/01 0734) Pulse Rate:  [57-109] 96 (07/01 0800) Resp:  [21-24] 24 (07/01 0800) BP: (98-120)/(57-78) 98/57 (07/01 0800) SpO2:  [98 %-100 %] 100 % (07/01 0800) Arterial Line BP: (86-120)/(52-82) 96/62 (07/01 0800) FiO2 (%):  [40 %] 40 % (07/01 0800) Weight:  [186.2 kg] 186.2 kg (07/01 0406) Last BM Date : 01/21/23  Weight change: Filed Weights   01/22/23 0502 01/23/23 0413 01/24/23 0406  Weight: (!) 194.5 kg (!) 187.5 kg (!) 186.2 kg    Intake/Output:   Intake/Output Summary (Last 24 hours) at 01/24/2023 0839 Last data filed at 01/24/2023 0800 Gross per 24 hour  Intake 2448.16 ml  Output 6003.9 ml  Net -3555.74 ml      Physical Exam    CVP 13  General:  intubated and sedated, super morbidly obese  HEENT: Normal + ETT  Neck: Supple. Thick neck, JVD not well visualized . Carotids 2+ bilat; no bruits. No lymphadenopathy or thyromegaly appreciated. Cor: PMI nondisplaced. Regular rhythm, tachy rate. No rubs, gallops or murmurs. Lungs: intubated, decreased BS anteriorly  Abdomen: obese, soft, nontender, nondistended. No hepatosplenomegaly. No bruits or masses. Good bowel sounds. Extremities: No cyanosis, clubbing, rash, trace b/l LE  Neuro: intubated and sedated    Telemetry   Sinus tach low 100s, 5 beat run NSVT   EKG    N/A   Labs    CBC Recent Labs    01/21/23 1739 01/21/23 1748 01/23/23 0405 01/24/23 0419  WBC 20.7*   < > 26.4* 20.1*  NEUTROABS 19.4*  --   --   --   HGB 10.5*   < > 10.2*  9.7*  HCT 33.1*   < > 31.1* 29.6*  MCV 100.0   < > 100.6* 98.7  PLT 192   < > 221 240   < > = values in this interval not displayed.   Basic Metabolic Panel Recent Labs    16/10/96 0405 01/23/23 1511 01/24/23 0419  NA 132* 130* 133*  K 4.9 4.7 4.2  CL 95* 94* 92*  CO2 21* 20* 22  GLUCOSE 113* 113* 143*  BUN 44* 41* 36*  CREATININE 7.38* 6.00* 4.76*  CALCIUM 9.0 8.8* 9.3  MG 2.2  --  2.6*  PHOS 5.7* 6.0* 5.4*   Liver Function Tests Recent Labs    01/21/23 1739 01/21/23 1842 01/23/23 1511 01/24/23 0419  AST 18  --   --   --   ALT 11  --   --   --   ALKPHOS 97  --   --   --   BILITOT 2.3*  --   --   --   PROT 7.6  --   --   --   ALBUMIN 3.4*   < > 3.1* 3.2*   < > = values in this interval not displayed.   No results for input(s): "LIPASE", "AMYLASE" in the last 72 hours. Cardiac Enzymes No results for input(s): "CKTOTAL", "CKMB", "  CKMBINDEX", "TROPONINI" in the last 72 hours.  BNP: BNP (last 3 results) Recent Labs    05/22/22 0732 08/05/22 1720 01/21/23 1739  BNP 373.1* 15.5 1,528.6*    ProBNP (last 3 results) No results for input(s): "PROBNP" in the last 8760 hours.   D-Dimer No results for input(s): "DDIMER" in the last 72 hours. Hemoglobin A1C No results for input(s): "HGBA1C" in the last 72 hours. Fasting Lipid Panel Recent Labs    01/23/23 0405  TRIG 234*   Thyroid Function Tests No results for input(s): "TSH", "T4TOTAL", "T3FREE", "THYROIDAB" in the last 72 hours.  Invalid input(s): "FREET3"  Other results:   Imaging    No results found.   Medications:     Scheduled Medications:  apixaban  5 mg Per Tube BID   Chlorhexidine Gluconate Cloth  6 each Topical Q0600   lidocaine  1 patch Transdermal Q24H   melatonin  5 mg Per Tube QHS   mouth rinse  15 mL Mouth Rinse Q2H   pantoprazole (PROTONIX) IV  40 mg Intravenous Q24H    Infusions:  sodium chloride Stopped (01/22/23 1859)   cefTRIAXone (ROCEPHIN)  IV Stopped (01/23/23  1107)   feeding supplement (VITAL 1.5 CAL) 10 mL/hr at 01/24/23 0800   fentaNYL infusion INTRAVENOUS 225 mcg/hr (01/24/23 0800)   norepinephrine (LEVOPHED) Adult infusion 3 mcg/min (01/24/23 0800)   prismasol BGK 2/2.5 dialysis solution 1,500 mL/hr at 01/24/23 0741   prismasol BGK 2/2.5 replacement solution 500 mL/hr at 01/24/23 0654   prismasol BGK 2/2.5 replacement solution 400 mL/hr at 01/24/23 0413   propofol (DIPRIVAN) infusion 30 mcg/kg/min (01/24/23 0800)   vancomycin Stopped (01/23/23 1406)    PRN Medications: sodium chloride, acetaminophen **OR** acetaminophen, fentaNYL, haloperidol lactate, ondansetron (ZOFRAN) IV, mouth rinse, polyethylene glycol, polyvinyl alcohol    Patient Profile   37 y.o. 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 Started on CRRT. BCx + for strep pneumo   Assessment/Plan   1.  Acute on chronic systolic CHF: Nonischemic CMP, LHC 2021 with no CAD. Previous hospitalizations w/shock in setting of rapid AFib/Flutter requiring milrinone. Most recent echo 5/24 was unchanged with EF 20-25%, RV difficult to visualize.  He signs off early from HD and is significantly volume overloaded this admission.  Suspect combination cardiogenic and septic shock at this time, currently on NE 3. CVP 13  - CVVH for volume removal, continue UF net 150 cc/hr.  - Continue NE as needed for MAP.  Restart home midodrine to help w/ wean  - Not candidate for ICD given obesity and dialysis  - Not candidate for advanced therapies.  2. Atrial flutter/fibrillation: History of multiple DCCVs. Amiodarone previously stopped due to elevated LFTs. Restarted  05/20/22 by Dr. Elberta Fortis.  Concern for possible amiodarone pulmonary toxicity w/ elevated ESR and CRP 11/23. He has been off amiodarone and was treated with steroids.  AF ablation [facility] in 12/23. He was readmitted with AF/RVR but is now back in NSR.  - Not candidate for amiodarone (lung and hepatic toxicity) or Tikosyn (ESRD), no anti-arrhythmic option.   - Continue Eliquis 5 mg bid  3. Possible amiodarone toxicity: Seen by pulmonary during admit 11/23. Had elevated ESR  and sed rate. Treated with prednisone.   4. ESRD: HD per nephrology, on T,Th,Sat schedule.  Poor compliance with chronic treatment (signs off early).  - Significantly volume overloaded - CVVH, continue net UF 150 cc/hr.  5. ID/Bacteremia: Fever at admission, now afebrile. BCx + for strep pneumo. WBCs 26>>20K  Concern for PNA.  - On vancomycin/Zosyn.  6. OHS/OSA: Has been on CPAP and home oxygen.  7. Lower extremity wounds: - Per primary  - Wound consulted 8. Acute on chronic hypoxemic respiratory failure: Baseline OHS/OSA, now suspect possible PNA and CHF/volume overload. Intubated.  - Antibiotics as above.  - CVVH.   Length of Stay: [address] Delmer Islam  01/24/2023, 8:39 AM  Advanced Heart Failure Team Pager [phone] (M-F; 7a - 5p)  Please contact [facility] for night-coverage after hours (5p -7a ) and weekends on [email or web address]  Patient seen with PA, agree with the above note.   CVP 18-19 on my read today.  UF via CVVH pulling 150 cc/hr.  He remains on NE 3.  He remains in NSR.    General: Intubated/sedated.  Neck: JVP difficult, no thyromegaly or thyroid nodule.  Lungs: Clear to auscultation bilaterally with normal respiratory effort. CV: Nondisplaced PMI.  Heart regular S1/S2, no S3/S4, no murmur.  1+ edema to knees.  Abdomen: Soft, nontender, no hepatosplenomegaly, no distention.  Skin: Intact without lesions or rashes.  Neurologic: Sedated Extremities: No clubbing or cyanosis.  HEENT: Normal.    CVP remains quite high on my read, 18-19. Would continue CVVH with goal UF 150 cc/hr net negative.   He is maintaining NSR.  He is not a candidate for any anti-arrhythmics.   Will start on midodrine 5 mg tid and aim to wean off NE.   Strep pneumoniae bacteremia, suspect PNA source.  On vancomycin/ceftriaxone per CCM.   CRITICAL CARE Performed by: Marca Ancona  Total critical care time: 35 minutes  Critical care time was exclusive of separately billable procedures and treating other patients.  Critical care was necessary to treat or prevent imminent or life-threatening deterioration.  Critical care was time spent personally by me on the following activities: development of treatment plan with patient and/or surrogate as well as nursing, discussions with consultants, evaluation of patient's response to treatment, examination of patient, obtaining history from patient or surrogate, ordering and performing treatments and interventions, ordering and review of laboratory studies, ordering and review of radiographic studies, pulse oximetry and re-evaluation of patient's condition.  Marca Ancona 01/24/2023 9:01 AM

## 2023-01-25 ENCOUNTER — Inpatient Hospital Stay (HOSPITAL_COMMUNITY): Payer: BC Managed Care – PPO

## 2023-01-25 ENCOUNTER — Telehealth: Payer: Self-pay

## 2023-01-25 DIAGNOSIS — B953 Streptococcus pneumoniae as the cause of diseases classified elsewhere: Secondary | ICD-10-CM | POA: Diagnosis not present

## 2023-01-25 DIAGNOSIS — R7881 Bacteremia: Secondary | ICD-10-CM

## 2023-01-25 DIAGNOSIS — Z9911 Dependence on respirator [ventilator] status: Secondary | ICD-10-CM | POA: Diagnosis not present

## 2023-01-25 DIAGNOSIS — I5023 Acute on chronic systolic (congestive) heart failure: Secondary | ICD-10-CM | POA: Diagnosis not present

## 2023-01-25 DIAGNOSIS — J9601 Acute respiratory failure with hypoxia: Secondary | ICD-10-CM | POA: Diagnosis not present

## 2023-01-25 LAB — HEPATITIS B SURFACE ANTIBODY, QUANTITATIVE: Hep B S AB Quant (Post): 106 m[IU]/mL (ref >9.9)

## 2023-01-25 LAB — CULTURE, BLOOD (ROUTINE X 2)

## 2023-01-25 LAB — MAGNESIUM: Magnesium: 2.8 mg/dL — ABNORMAL HIGH (ref 1.7–2.4)

## 2023-01-25 LAB — CBC
HCT: 31 % — ABNORMAL LOW (ref 39.0–52.0)
Hemoglobin: 10 g/dL — ABNORMAL LOW (ref 13.0–17.0)
MCH: 31.8 pg (ref 26.0–34.0)
MCHC: 32.3 g/dL (ref 30.0–36.0)
MCV: 98.7 fL (ref 80.0–100.0)
Platelets: 280 10*3/uL (ref 150–400)
RBC: 3.14 MIL/uL — ABNORMAL LOW (ref 4.22–5.81)
RDW: 16 % — ABNORMAL HIGH (ref 11.5–15.5)
WBC: 11.2 10*3/uL — ABNORMAL HIGH (ref 4.0–10.5)
nRBC: 0.3 % — ABNORMAL HIGH (ref 0.0–0.2)

## 2023-01-25 LAB — RENAL FUNCTION PANEL
Albumin: 3.2 g/dL — ABNORMAL LOW (ref 3.5–5.0)
Albumin: 3.1 g/dL — ABNORMAL LOW (ref 3.5–5.0)
Anion gap: 15 (ref 5–15)
Anion gap: 14 (ref 5–15)
BUN: 29 mg/dL — ABNORMAL HIGH (ref 6–20)
BUN: 28 mg/dL — ABNORMAL HIGH (ref 6–20)
CO2: 23 mmol/L (ref 22–32)
CO2: 20 mmol/L — ABNORMAL LOW (ref 22–32)
Calcium: 9.1 mg/dL (ref 8.9–10.3)
Calcium: 8.8 mg/dL — ABNORMAL LOW (ref 8.9–10.3)
Chloride: 95 mmol/L — ABNORMAL LOW (ref 98–111)
Chloride: 97 mmol/L — ABNORMAL LOW (ref 98–111)
Creatinine, Ser: 3.6 mg/dL — ABNORMAL HIGH (ref 0.61–1.24)
Creatinine, Ser: 3.32 mg/dL — ABNORMAL HIGH (ref 0.61–1.24)
GFR, Estimated: 21 mL/min — ABNORMAL LOW (ref >60)
GFR, Estimated: 24 mL/min — ABNORMAL LOW (ref >60)
Glucose, Bld: 117 mg/dL — ABNORMAL HIGH (ref 70–99)
Glucose, Bld: 122 mg/dL — ABNORMAL HIGH (ref 70–99)
Phosphorus: 3.7 mg/dL (ref 2.5–4.6)
Phosphorus: 3.5 mg/dL (ref 2.5–4.6)
Potassium: 3.5 mmol/L (ref 3.5–5.1)
Potassium: 3.1 mmol/L — ABNORMAL LOW (ref 3.5–5.1)
Sodium: 133 mmol/L — ABNORMAL LOW (ref 135–145)
Sodium: 131 mmol/L — ABNORMAL LOW (ref 135–145)

## 2023-01-25 LAB — COOXEMETRY PANEL
Carboxyhemoglobin: 1.9 % — ABNORMAL HIGH (ref 0.5–1.5)
Methemoglobin: <0.7 % (ref 0.0–1.5)
O2 Saturation: 56.2 %
Total hemoglobin: 12.1 g/dL (ref 12.0–16.0)

## 2023-01-25 LAB — ECHOCARDIOGRAM COMPLETE
Height: 72 in
S' Lateral: 5.6 cm
Weight: 6356.3 oz

## 2023-01-25 LAB — GLUCOSE, CAPILLARY
Glucose-Capillary: 100 mg/dL — ABNORMAL HIGH (ref 70–99)
Glucose-Capillary: 112 mg/dL — ABNORMAL HIGH (ref 70–99)
Glucose-Capillary: 121 mg/dL — ABNORMAL HIGH (ref 70–99)
Glucose-Capillary: 128 mg/dL — ABNORMAL HIGH (ref 70–99)

## 2023-01-25 MED ORDER — MIDODRINE HCL 5 MG PO TABS
10.0000 mg | ORAL_TABLET | Freq: Three times a day (TID) | ORAL | Status: DC
  Administered 2023-01-25 – 2023-01-26 (×3): 10 mg
  Filled 2023-01-25 (×3): qty 2

## 2023-01-25 MED ORDER — PROSOURCE TF20 ENFIT COMPATIBL EN LIQD
60.0000 mL | Freq: Four times a day (QID) | ENTERAL | Status: DC
  Administered 2023-01-25 – 2023-02-01 (×28): 60 mL
  Filled 2023-01-25 (×24): qty 60

## 2023-01-25 MED ORDER — VITAL 1.5 CAL PO LIQD
1000.0000 mL | ORAL | Status: DC
  Administered 2023-01-25 – 2023-01-28 (×5): 1000 mL
  Filled 2023-01-25: qty 1000

## 2023-01-25 MED ORDER — MIDODRINE HCL 5 MG PO TABS
5.0000 mg | ORAL_TABLET | Freq: Once | ORAL | Status: AC
  Administered 2023-01-25: 5 mg
  Filled 2023-01-25: qty 1

## 2023-01-25 MED ORDER — PRISMASOL BGK 4/2.5 32-4-2.5 MEQ/L REPLACEMENT SOLN: Status: DC

## 2023-01-25 MED ORDER — MIDODRINE HCL 5 MG PO TABS: 5.0000 mg | ORAL_TABLET | Freq: Once | ORAL | Status: DC

## 2023-01-25 MED ORDER — HEPARIN SODIUM (PORCINE) 1000 UNIT/ML IJ SOLN
INTRAMUSCULAR | Status: AC
  Filled 2023-01-25: qty 3

## 2023-01-25 MED ORDER — RENA-VITE PO TABS
1.0000 | ORAL_TABLET | Freq: Every day | ORAL | Status: DC
  Administered 2023-01-25 – 2023-03-03 (×38): 1
  Filled 2023-01-25 (×38): qty 1

## 2023-01-25 MED ORDER — POTASSIUM CHLORIDE 20 MEQ PO PACK
20.0000 meq | PACK | Freq: Once | ORAL | Status: AC
  Administered 2023-01-25: 20 meq
  Filled 2023-01-25: qty 1

## 2023-01-25 MED ORDER — PRISMASOL BGK 4/2.5 32-4-2.5 MEQ/L EC SOLN: Status: DC

## 2023-01-25 NOTE — Progress Notes (Addendum)
OT Cancellation Note  Patient Details Name: Doris Head MRN: [id number] DOB: 06/12/1986   Cancelled Treatment:    Reason Eval/Treat Not Completed: Medical issues which prohibited therapy (pt still intubated, possible plan to extubate today. Will follow up for OT evaluation as appropriate/schedule permits)   Addendum 1655: Per RN pt not extubating today. OT will s/o. Please reconsult OT for evaluation when appropriate.  Carver Fila, OTD, OTR/L [facility] [phone]   Dalphine Handing 01/25/2023, 7:30 AM

## 2023-01-25 NOTE — Progress Notes (Signed)
Echocardiogram 2D Echocardiogram has been performed.  Lucendia Herrlich 01/25/2023, 8:56 AM

## 2023-01-25 NOTE — Progress Notes (Signed)
NAME:  Phillip Young, MRN:  [id number], DOB:  1985/09/18, LOS: 4 ADMISSION DATE:  01/21/2023, CONSULTATION DATE:  01/22/23 REFERRING MD:  Danise Edge, CHIEF COMPLAINT:  SOB   History of Present Illness:  37 year old man well known to our service w/ hx of ESRD, nonischemic cardiomyopathy, refractory afib, possible amio lung toxicity presenting from home with fevers, joint pains, SOB.  Missed HD on Thursday.  Has hx of leaving HD early.  Looks to be 10kg over dry weight if scales are accurate.  His breathing status is worsening, in afib/RVR, and BP borderline so coming to ICU for further management.  ROS as below.  Pertinent  Medical History   Past Medical History:  Diagnosis Date   Acute on chronic respiratory failure with hypoxia ([name]) 04/21/2021   Acute on chronic systolic (congestive) heart failure ([name]) 02/26/2020   Amiodarone toxicity    Anemia    Atrial flutter ([name])    Biventricular congestive heart failure ([name])    Chronic hypoxemic respiratory failure ([name])    Class 3 severe obesity due to excess calories with serious comorbidity and body mass index (BMI) of 50.0 to 59.9 in adult [facility]) 02/26/2020   Essential hypertension 02/26/2020   GERD without esophagitis 02/26/2020   Hidradenitis suppurativa 02/26/2020   NICM (nonischemic cardiomyopathy) ([name])    Obesity hypoventilation syndrome ([name])    OSA (obstructive sleep apnea)    PAF (paroxysmal atrial fibrillation) ([name])    Pneumonia    Prediabetes 02/26/2020   Significant Hospital Events: Including procedures, antibiotic start and stop dates in addition to other pertinent events   6/28 admit, emergent HD 6/29 Afib/RVR, shock, ICU transfer 6/30 intubated and placed on vasopressor support and started on CRRT 7/1 remains intubated on CRRT continue with pressor support 7/2 agitated with SAT, remains on CRRT and low dose levophed  Interim History / Subjective:  Blood cultures growing strep pneumo  Remains agitated with sedation wean,  on CRRT and levophed  Objective   Blood pressure 104/72, pulse 67, temperature 97.8 F (36.6 C), temperature source Oral, resp. rate (!) 24, height 6' (1.829 m), weight (!) 180.2 kg, SpO2 100 %. CVP:  [9 mmHg-40 mmHg] 12 mmHg  Vent Mode: PRVC FiO2 (%):  [40 %] 40 % Set Rate:  [24 bmp] 24 bmp Vt Set:  [409 mL] 620 mL PEEP:  [5 cmH20] 5 cmH20 Plateau Pressure:  [25 cmH20-29 cmH20] 25 cmH20   Intake/Output Summary (Last 24 hours) at 01/25/2023 1201 Last data filed at 01/25/2023 1100 Gross per 24 hour  Intake 2134.69 ml  Output 5659.5 ml  Net -3524.81 ml    Filed Weights   01/23/23 0413 01/24/23 0406 01/25/23 0333  Weight: (!) 187.5 kg (!) 186.2 kg (!) 180.2 kg     General:  critically ill obese M sedated and  HEENT: MM pink/moist Neuro: examined on sedation RASS -3 CV: s1s2 rrr, no m/r/g PULM:  mechanically ventilated with decreased air entry bilateral bases GI: soft, non-distended  Extremities: warm/dry, no edema  Skin: no rashes or lesions    Labs reviewed: Coox 56% WBC 11 Na 133 Mag 2.8  Resolved Hospital Problem list   N/A  Assessment & Plan:      Acute on chronic hypoxemic respiratory failure, multifactorial including pneumonia and volume overload due to noncompliance with hemodialysis -Patient came with shortness of breath, he was noted to be volume overloaded, he missed hemodialysis session but usually does not complete hemodialysis -He was noted to be volume  overload with X-ray chest suggestive of bilateral lower lobe pneumonia History of OHS/OSA P: -continue volume removal with CRRT  -continue Ceftriaxone  -Maintain full vent support with SAT/SBT as tolerated -titrate Vent setting to maintain SpO2 greater than or equal to 90%. -HOB elevated 30 degrees. -Plateau pressures less than 30 cm H20.  -Follow chest x-ray, ABG prn.   -Bronchial hygiene and RT/bronchodilator protocol.   Septic shock due to strep pneumo bacteremia, POA -Patient blood  culture grew pneumococcal pneumonia likely secondary to bilateral lower lobe pneumonia. Sensitivities are pending P: -Ventilator support as above -Continue empiric ceftriaxone  -levophed requirement decreasing, continue pressors for MAP< 65  -Monitor urine output  Acute on chronic HFrEF Nonischemic cardiomyopathy -Echocardiogram 5/24 with unchanged EF of 20 to 25% History of paroxysmal rapid A-fib/flutter with history of possible amiodarone toxicity treated with steroids -History of multiple DCCV's with history of amiodarone toxicity P: -Heart failure following, appreciate assistance -Continuous telemetry -Strict intake and output  -Daily weight to assess volume status -Daily assessment for need to diurese -Closely monitor renal function and electrolytes  -Volume removal per CRRT as above -Pressor support as above -Per heart failure not a candidate for advanced therapies -Continue Eliquis  End-stage renal disease on hemodialysis Hypervolemic hyponatremia -improving Hyperphosphatemia Hyperkalemia - improved High anion gap metabolic acidosis -On HD T/Th/Sa but is often noncompliant with sessions P: -Nephrology following, appreciate assistance -Volume removal per CRRT -Follow renal function  -Trend Bmet -Avoid nephrotoxins -Ensure adequate renal perfusion   Acute septic encephalopathy He was confused and restless when he came P: -agitated with sedation wean -Maintain neuro protective measures; goal for eurothermia, euglycemia, eunatermia, -normoxia, and PCO2 goal of 35-40 -Nutrition and bowel regiment  -Seizure precautions  -Aspirations precautions  -Minimize sedation  Hematuria POA No hydronephrosis, medical renal disease seen on renal ultrasound Possible traumatic Foley insertion P: -foley out, continue to monitor   Morbid obesity P: -Continue tube feeds -RD following -Protein supplementation  Goals of care Mother updated at bedside 6/30 with decision to  continue with aggressive interventions, given multisystem organ involvement with medication and medical noncompliance needs ongoing goals of care discussions  Best Practice (right click and "Reselect all SmartList Selections" daily)   Diet/type: Trickle tube feeds DVT prophylaxis: DOAC GI prophylaxis: Protonix Lines: Yes still needed Foley: Yes still needed Code Status:  full code Last date of multidisciplinary goals of care: 6/30, update 7/2 pending    CRITICAL CARE Performed by: Darcella Gasman Mayzee Reichenbach  Total critical care time: 35 minutes  Critical care time was exclusive of separately billable procedures and treating other patients.  Critical care was necessary to treat or prevent imminent or life-threatening deterioration.  Critical care was time spent personally by me on the following activities: development of treatment plan with patient and/or surrogate as well as nursing, discussions with consultants, evaluation of patient's response to treatment, examination of patient, obtaining history from patient or surrogate, ordering and performing treatments and interventions, ordering and review of laboratory studies, ordering and review of radiographic studies, pulse oximetry and re-evaluation of patient's condition.  Darcella Gasman Jonanthony Nahar, PA-C [facility] See Amion for pager If no response to pager , please call [phone] until 7pm After 7:00 pm call Elink  [phone]

## 2023-01-25 NOTE — Progress Notes (Signed)
Nutrition Follow-up  DOCUMENTATION CODES:   Morbid obesity  INTERVENTION:   Tube Feeding via OG:  Vital 1.5 at 55 ml/hr Pro-Source TF20 60 mL QID TF regimen provides 2300 kcals, 1689 g of protein and 1003 mL of free water  Pt receiving additional fat calories via propofol infusion  Add Renal MVI daily  NUTRITION DIAGNOSIS:   Inadequate oral intake related to inability to eat as evidenced by NPO status.  Being addressed via TF  GOAL:   Patient will meet greater than or equal to 90% of their needs  Progressing  MONITOR:   Vent status, TF tolerance, Labs, Weight trends  REASON FOR ASSESSMENT:   Ventilator, Consult Assessment of nutrition requirement/status  ASSESSMENT:   37 year old male with PMHx of ESRD on HD, nonischemic cardiomyopathy, refractory A-fib, possible amio lung toxicity admitted with acute on chronic hypoxmic respiratory failure in setting of virus and missed HD, probable sepsis.  Pt remains on vent support, CRRT with UF around 150 ml/hr. Levophed at 7. Propofol at 46.7 ml/hr providing significant calories from fat  Tolerating Vital 1.5 at 40 ml/hr, continuing to titrate to goal   Weight down to 180.2 kg; wt 194.5 kg on 6/29. Net negative 13L. Outpatient EDW 180 kg, Some mild non-pitting edema on exam  Labs: sodium 133 (L), BUN 29, Creatinine 3.60 Meds: aranesp, protonix   NUTRITION - FOCUSED PHYSICAL EXAM:  Flowsheet Row Most Recent Value  Orbital Region No depletion  Upper Arm Region No depletion  Thoracic and Lumbar Region No depletion  Buccal Region Unable to assess  Temple Region No depletion  Clavicle Bone Region No depletion  Clavicle and Acromion Bone Region No depletion  Scapular Bone Region No depletion  Dorsal Hand Unable to assess  Patellar Region Unable to assess  Anterior Thigh Region Unable to assess  Posterior Calf Region Unable to assess  Edema (RD Assessment) Mild       Diet Order:   Diet Order     None        EDUCATION NEEDS:   No education needs have been identified at this time  Skin:  Skin Assessment: Reviewed RN Assessment  Last BM:  01/21/23 per chart  Height:   Ht Readings from Last 1 Encounters:  01/21/23 6' (1.829 m)    Weight:   Wt Readings from Last 1 Encounters:  01/25/23 (!) 180.2 kg    Ideal Body Weight:  80.9 kg  BMI:  Body mass index is 53.88 kg/m.  Estimated Nutritional Needs:   Kcal:  2100-2300 kcals  Protein:  160-190 g  Fluid:  UOP + 1 L   Phillip Starcher MS, RDN, LDN, CNSC Registered Dietitian 3 Clinical Nutrition RD Pager and On-Call Pager Number Located in [place]

## 2023-01-25 NOTE — Progress Notes (Signed)
[facility] NEPHROLOGY PROGRESS NOTE  Assessment/ Plan: Pt is a 37 y.o. yo male chronic CHF, PAF, morbid obesity, OSA, ESRD on HD with pneumonia and CHF exacerbation.  OP HD orders: [facility], TTS. 4hrs 15 min. F180. Flow rates: 425/autoflow 1.5. 3k 2.5cal. EDW 180kg. Access: TDC. Meds: Mircers 200 mcg every two weeks (last dose 6/13), sensipar 60mg  three times per week, hectorol three times per week . Historically noncompliant with HD I.e. doesn't stay for full prescribed treatment times, high IDWG/does not adhere to fluid restriction   # ESRD: Required emergent HD on 6/29 because of fluid overload.  He is on Levophed for hypotension/shock.  Started CRRT on 6/29 with UF goal 100-150 cc an hour as tolerated by BP.   CVP around 13 therefore continue CRRT with UF around 150 cc an hour net UF.  Change pre and post filter potassium to 4K bath.  Monitor lab.  Discussed with ICU nurse.  # Acute on chronic systolic CHF, nonischemic: EF 29-56%.  Optimize volume with CRRT.  Discussed with the cardiology team.  Not a candidate for ICD and advanced therapies for CHF because of obesity and dialysis.  # Sepsis/septic shock: Currently on broad-spectrum antibiotics, per primary team.  Levophed as a pressor.  # Anemia of CKD: Hemoglobin trending down.  Received Aranesp on 7/1.  Subjective: Seen and examined in ICU.  Remains intubated, sedated.  On Levophed at small dose.  Tolerating CRRT with UF around 150 cc an hour.  No other new event. Objective Vital signs in last 24 hours: Vitals:   01/25/23 0915 01/25/23 0930 01/25/23 0945 01/25/23 1000  BP:    (!) 94/49  Pulse: 65 71 74 69  Resp: (!) 24 (!) 24 (!) 24 (!) 24  Temp:      TempSrc:      SpO2: 100% 100% 100% 100%  Weight:      Height:       Weight change: -6 kg  Intake/Output Summary (Last 24 hours) at 01/25/2023 1048 Last data filed at 01/25/2023 1000 Gross per 24 hour  Intake 2071.99 ml  Output 5943.6 ml  Net -3871.61 ml         Labs: RENAL PANEL Recent Labs  Lab 01/23/23 0405 01/23/23 1511 01/24/23 0419 01/24/23 1520 01/25/23 0335  NA 132* 130* 133* 132* 133*  K 4.9 4.7 4.2 3.9 3.5  CL 95* 94* 92* 97* 95*  CO2 21* 20* 22 20* 23  GLUCOSE 113* 113* 143* 140* 117*  BUN 44* 41* 36* 31* 29*  CREATININE 7.38* 6.00* 4.76* 4.02* 3.60*  CALCIUM 9.0 8.8* 9.3 9.0 9.1  MG 2.2  --  2.6*  --  2.8*  PHOS 5.7* 6.0* 5.4* 4.5 3.7  ALBUMIN 3.3* 3.1* 3.2* 3.1* 3.2*     Liver Function Tests: Recent Labs  Lab 01/21/23 1739 01/21/23 1842 01/24/23 0419 01/24/23 1520 01/25/23 0335  AST 18  --   --   --   --   ALT 11  --   --   --   --   ALKPHOS 97  --   --   --   --   BILITOT 2.3*  --   --   --   --   PROT 7.6  --   --   --   --   ALBUMIN 3.4*   < > 3.2* 3.1* 3.2*   < > = values in this interval not displayed.    No results for input(s): "LIPASE", "  AMYLASE" in the last 168 hours. No results for input(s): "AMMONIA" in the last 168 hours. CBC: Recent Labs    03/26/22 1958 03/27/22 0300 06/04/22 1924 06/05/22 0154 08/09/22 0042 08/10/22 0107 01/22/23 0618 01/22/23 2014 01/23/23 0405 01/24/23 0419 01/25/23 0335  HGB  --    < >  --    < > 6.7*   < > 10.4* 11.6* 10.2* 9.7* 10.0*  MCV  --    < >  --    < > 106.1*   < > 98.1  --  100.6* 98.7 98.7  VITAMINB12 297  --  5,998*  --   --   --   --   --   --   --   --   FOLATE  --   --  15.7  --   --   --   --   --   --   --   --   FERRITIN 370*  --   --   --  156  --   --   --   --   --   --   TIBC 321  --   --   --  330  --   --   --   --   --   --   IRON 27*  --   --   --  28*  --   --   --   --   --   --    < > = values in this interval not displayed.     Cardiac Enzymes: No results for input(s): "CKTOTAL", "CKMB", "CKMBINDEX", "TROPONINI" in the last 168 hours. CBG: Recent Labs  Lab 01/24/23 1601 01/24/23 1930 01/25/23 0004 01/25/23 0344 01/25/23 0846  GLUCAP 134* 95 128* 121* 100*     Iron Studies: No results for input(s):  "IRON", "TIBC", "TRANSFERRIN", "FERRITIN" in the last 72 hours. Studies/Results: ECHOCARDIOGRAM COMPLETE  Result Date: 01/25/2023    ECHOCARDIOGRAM REPORT   Patient Name:   Phillip Young Date of Exam: 01/25/2023 Medical Rec #:  [id number]      Height:       72.0 in Accession #:    [id number]     Weight:       397.3 lb Date of Birth:  Apr 12, 1986       BSA:          2.852 m Patient Age:    36 years       BP:           115/83 mmHg Patient Gender: M              HR:           30 bpm. Exam Location:  Inpatient Procedure: 2D Echo, Cardiac Doppler and Color Doppler Indications:    Bacteremia R78.81  History:        Patient has prior history of Echocardiogram examinations, most                 recent 12/24/2022. CHF, Arrythmias:Atrial Fibrillation and Atrial                 Flutter, Signs/Symptoms:Hypotension; Risk Factors:Sleep Apnea                 and Diabetes. ESRD.  Sonographer:    Lucendia Herrlich Referring Phys: [id number] LAURA P CLARK  Sonographer Comments: Suboptimal parasternal window, no apical window, echo performed with patient supine and on artificial respirator and patient is obese.  IMPRESSIONS  1. Left ventricular ejection fraction, by estimation, is 20 to 25%. The left ventricle has severely decreased function. The left ventricle has no regional wall motion abnormalities. The left ventricular internal cavity size was mildly dilated. There is mild concentric left ventricular hypertrophy. Left ventricular diastolic function could not be evaluated.  2. Right ventricular systolic function is low normal. The right ventricular size is normal. Mildly increased right ventricular wall thickness. Tricuspid regurgitation signal is inadequate for assessing PA pressure.  3. The mitral valve is grossly normal. Trivial mitral valve regurgitation. No evidence of mitral stenosis.  4. The aortic valve was not well visualized. Aortic valve regurgitation is not visualized.  5. The inferior vena cava is dilated in size with <50%  respiratory variability, suggesting right atrial pressure of 15 mmHg. Comparison(s): No significant change from prior study. Prior images reviewed side by side. FINDINGS  Left Ventricle: Left ventricular ejection fraction, by estimation, is 20 to 25%. The left ventricle has severely decreased function. The left ventricle has no regional wall motion abnormalities. The left ventricular internal cavity size was mildly dilated. There is mild concentric left ventricular hypertrophy. Left ventricular diastolic function could not be evaluated. Right Ventricle: The right ventricular size is normal. Mildly increased right ventricular wall thickness. Right ventricular systolic function is low normal. Tricuspid regurgitation signal is inadequate for assessing PA pressure. Left Atrium: Left atrial size was not well visualized. Right Atrium: Right atrial size was not well visualized. Pericardium: Trivial pericardial effusion is present. The pericardial effusion is posterior to the left ventricle. Mitral Valve: The mitral valve is grossly normal. Trivial mitral valve regurgitation. No evidence of mitral valve stenosis. Tricuspid Valve: The tricuspid valve is not well visualized. Tricuspid valve regurgitation is mild . No evidence of tricuspid stenosis. Aortic Valve: The aortic valve was not well visualized. Aortic valve regurgitation is not visualized. Pulmonic Valve: The pulmonic valve was not well visualized. Pulmonic valve regurgitation is mild. No evidence of pulmonic stenosis. Aorta: The aortic root and ascending aorta are structurally normal, with no evidence of dilitation. Venous: The inferior vena cava is dilated in size with less than 50% respiratory variability, suggesting right atrial pressure of 15 mmHg. IAS/Shunts: The interatrial septum was not well visualized.  LEFT VENTRICLE PLAX 2D LVIDd:         6.20 cm LVIDs:         5.60 cm LV PW:         1.00 cm LV IVS:        1.20 cm LVOT diam:     2.40 cm LVOT Area:      4.52 cm  IVC IVC diam: 2.70 cm LEFT ATRIUM         Index LA diam:    4.20 cm 1.47 cm/m                        PULMONIC VALVE AORTA                 PR End Diast Vel: 13.84 msec Ao Root diam: 3.50 cm Ao Asc diam:  3.00 cm TRICUSPID VALVE TR Peak grad:   17.3 mmHg TR Vmax:        208.00 cm/s  SHUNTS Systemic Diam: 2.40 cm Riley Lam MD Electronically signed by Riley Lam MD Signature Date/Time: 01/25/2023/9:17:15 AM    Final     Medications: Infusions:   prismasol BGK 4/2.5 400 mL/hr at 01/25/23 0900    prismasol BGK  4/2.5 400 mL/hr at 01/25/23 0859   sodium chloride Stopped (01/22/23 1859)   cefTRIAXone (ROCEPHIN)  IV 2 g (01/25/23 1018)   feeding supplement (VITAL 1.5 CAL) 30 mL/hr at 01/25/23 1000   fentaNYL infusion INTRAVENOUS 200 mcg/hr (01/25/23 1012)   norepinephrine (LEVOPHED) Adult infusion 5 mcg/min (01/25/23 1000)   prismasol BGK 2/2.5 dialysis solution 1,500 mL/hr at 01/25/23 0722   propofol (DIPRIVAN) infusion 40 mcg/kg/min (01/25/23 1000)    Scheduled Medications:  apixaban  5 mg Per Tube BID   Chlorhexidine Gluconate Cloth  6 each Topical Q0600   darbepoetin (ARANESP) injection - DIALYSIS  100 mcg Subcutaneous Q Mon-1800   lidocaine  1 patch Transdermal Q24H   melatonin  5 mg Per Tube QHS   midodrine  10 mg Per Tube Q8H   mouth rinse  15 mL Mouth Rinse Q2H   pantoprazole (PROTONIX) IV  40 mg Intravenous Q24H    have reviewed scheduled and prn medications.  Physical Exam: General: Ill looking male, intubated, sedated, CVP around 13. Heart:RRR, s1s2 nl Lungs: Coarse breath sound bilateral Abdomen:soft, Non-tender, non-distended Extremities: Bilateral leg edema presents Dialysis Access: RIJ TDC in place.  Site clean.  Chaunta Bejarano Prasad Naji Mehringer 01/25/2023,10:48 AM  LOS: 4 days

## 2023-01-25 NOTE — Telephone Encounter (Signed)
Patients mother called she stated the patient is currently admitted for a chemically induced coma she is requesting a call back asap.

## 2023-01-25 NOTE — TOC Initial Note (Signed)
Transition of Care [facility]) - Initial/Assessment Note    Patient Details  Name: Phillip Young MRN: [id number] Date of Birth: 1986/05/13  [facility]) CM/SW Contact:    Elliot Cousin, RN Phone Number: [phone] 01/25/2023, 4:33 PM  Clinical Narrative:  HF TOC CM spoke to pt's mother, and pt lives with her in the home. He was driving to his appts. States he will need an handicap sticker, he gets short of breath walking long distances. Will continue to follow for dc needs.                   Expected Discharge Plan: Home/Self Care Barriers to Discharge: Continued Medical Work up   Patient Goals and CMS Choice Patient states their goals for this hospitalization and ongoing recovery are:: wants patient to recover          Expected Discharge Plan and Services   Discharge Planning Services: CM Consult   Living arrangements for the past 2 months: Single Family Home                                      Prior Living Arrangements/Services Living arrangements for the past 2 months: Single Family Home Lives with:: Parents              Current home services:  ([employer], oxygen, scale, rolling walker, cane)    Activities of Daily Living Home Assistive Devices/Equipment: None ADL Screening (condition at time of admission) Patient's cognitive ability adequate to safely complete daily activities?: Yes Is the patient deaf or have difficulty hearing?: No Does the patient have difficulty seeing, even when wearing glasses/contacts?: No Does the patient have difficulty concentrating, remembering, or making decisions?: No Patient able to express need for assistance with ADLs?: Yes Does the patient have difficulty dressing or bathing?: No Independently performs ADLs?: Yes (appropriate for developmental age) Does the patient have difficulty walking or climbing stairs?: No Weakness of Legs: None Weakness of Arms/Hands: None  Permission Sought/Granted Permission  sought to share information with : Case Manager, Family Supports Permission granted to share information with : Yes, Verbal Permission Granted  Share Information with NAME: Phillip Young     Permission granted to share info w Relationship: mother  Permission granted to share info w Contact Information: [phone]  Emotional Assessment   Attitude/Demeanor/Rapport: Intubated (Following Commands or Not Following Commands)          Admission diagnosis:  Acute hypoxic respiratory failure ([name]) [J96.01] Patient Active Problem List   Diagnosis Date Noted   Pressure injury of skin 01/23/2023   Acute hypoxic respiratory failure ([name]) 01/21/2023   A-fib ([name]) 01/21/2023   Acute on chronic hypoxic respiratory failure ([name]) 08/07/2022   Decompensated heart failure ([name]) 08/05/2022   Elevated liver enzymes 06/08/2022   Multiple nodules of lung 05/31/2022   Sepsis ([name])    Advanced care planning/counseling discussion    Recurrent infections 09/30/2021   Reactive depression 09/02/2021   Hypervolemia 08/18/2021   Type 2 diabetes mellitus with diabetic chronic kidney disease ([name]) 06/26/2021   Medical non-compliance 04/21/2021   Hypotension 04/21/2021   ESRD (end stage renal disease) on dialysis ([name]) 04/16/2021   Paroxysmal atrial flutter ([name]) 04/12/2021   Chronic respiratory failure with hypoxia ([name]) 02/11/2021   Chronic systolic CHF (congestive heart failure) ([name])    Chronic cough    Morbid obesity ([name]) 02/26/2020   GERD  without esophagitis 02/26/2020   OSA (obstructive sleep apnea) 02/26/2020   Hidradenitis suppurativa 02/26/2020   PCP:  Marrianne Mood, MD Pharmacy:   [facility] [phone] - [place], [place] - [address] AT [facility] [address] [place] [address] Phone: [phone] Fax: [phone]     Social Determinants of Health (SDOH) Social History: SDOH Screenings   Food Insecurity: No Food Insecurity (01/22/2023)   Housing: Low Risk  (01/22/2023)  Transportation Needs: No Transportation Needs (01/22/2023)  Utilities: Not At Risk (01/22/2023)  Alcohol Screen: Low Risk  (09/21/2022)  Depression (PHQ2-9): High Risk (09/21/2022)  Financial Resource Strain: High Risk (09/21/2022)  Physical Activity: Inactive (09/21/2022)  Social Connections: Unknown (09/21/2022)  Stress: No Stress Concern Present (09/21/2022)  Tobacco Use: Medium Risk (01/22/2023)   SDOH Interventions:     Readmission Risk Interventions    06/09/2022   12:19 PM 05/12/2021   10:35 AM 10/16/2020    2:10 PM  Readmission Risk Prevention Plan  Transportation Screening Complete Complete Complete  PCP or Specialist Appt within 5-7 Days   Complete  Home Care Screening   Complete  Medication Review (RN CM)   Referral to Pharmacy  Medication Review (RN Care Manager) Complete Complete   PCP or Specialist appointment within 3-5 days of discharge Complete Complete   HRI or Home Care Consult Complete Complete   SW Recovery Care/Counseling Consult Complete Complete   Palliative Care Screening Not Applicable Complete   Skilled Nursing Facility Not Applicable Complete

## 2023-01-25 NOTE — Progress Notes (Addendum)
Advanced Heart Failure Rounding Note  PCP-Cardiologist: Marca Ancona, MD   Subjective:    Remains intubated and sedated. Following some commands.   BCx + for strep pneumo. Abx narrowed to ceftriaxone. Afebrile. WBC 26>>20>>11K   On CVVHD, currently pulling 150cc/hr. Additional 5.9L volume removal yesterday.  CVP 13-14  On NE 5.   Maintaining NSR. 9 beat run of VT overnight.   Objective:   Weight Range: (!) 180.2 kg Body mass index is 53.88 kg/m.   Vital Signs:   Temp:  [96.6 F (35.9 C)-98.1 F (36.7 C)] 97.7 F (36.5 C) (07/02 0733) Pulse Rate:  [58-100] 62 (07/02 0700) Resp:  [18-24] 24 (07/02 0700) BP: (85-120)/(52-83) 115/83 (07/02 0400) SpO2:  [100 %] 100 % (07/02 0700) Arterial Line BP: (84-137)/(50-78) 100/57 (07/02 0700) FiO2 (%):  [40 %] 40 % (07/02 0742) Weight:  [180.2 kg] 180.2 kg (07/02 0333) Last BM Date : 01/21/23  Weight change: Filed Weights   01/23/23 0413 01/24/23 0406 01/25/23 0333  Weight: (!) 187.5 kg (!) 186.2 kg (!) 180.2 kg    Intake/Output:   Intake/Output Summary (Last 24 hours) at 01/25/2023 0753 Last data filed at 01/25/2023 0700 Gross per 24 hour  Intake 2135.45 ml  Output 5923 ml  Net -3787.55 ml      Physical Exam    CVP 14  General:  intubated and sedated, intermittently following commands  HEENT: + ETT, normal Neck: supple. Thick neck JVD, unable to visualized JVD. Carotids 2+ bilat; no bruits. No lymphadenopathy or thyromegaly appreciated. Cor: PMI nondisplaced. Regular rate & rhythm. No rubs, gallops or murmurs. Lungs: intubated and clear Abdomen: obese, soft, nontender, nondistended. No hepatosplenomegaly. No bruits or masses. Good bowel sounds. Extremities: no cyanosis, clubbing, rash, edema Neuro: intubated and sedated, intermittently follows commands     Telemetry   NSR 90s, 9 beat run NSVT x 1   EKG    N/A   Labs    CBC Recent Labs    01/24/23 0419 01/25/23 0335  WBC 20.1* 11.2*  HGB 9.7*  10.0*  HCT 29.6* 31.0*  MCV 98.7 98.7  PLT 240 280   Basic Metabolic Panel Recent Labs    16/10/96 0419 01/24/23 1520 01/25/23 0335  NA 133* 132* 133*  K 4.2 3.9 3.5  CL 92* 97* 95*  CO2 22 20* 23  GLUCOSE 143* 140* 117*  BUN 36* 31* 29*  CREATININE 4.76* 4.02* 3.60*  CALCIUM 9.3 9.0 9.1  MG 2.6*  --  2.8*  PHOS 5.4* 4.5 3.7   Liver Function Tests Recent Labs    01/24/23 1520 01/25/23 0335  ALBUMIN 3.1* 3.2*   No results for input(s): "LIPASE", "AMYLASE" in the last 72 hours. Cardiac Enzymes No results for input(s): "CKTOTAL", "CKMB", "CKMBINDEX", "TROPONINI" in the last 72 hours.  BNP: BNP (last 3 results) Recent Labs    05/22/22 0732 08/05/22 1720 01/21/23 1739  BNP 373.1* 15.5 1,528.6*    ProBNP (last 3 results) No results for input(s): "PROBNP" in the last 8760 hours.   D-Dimer No results for input(s): "DDIMER" in the last 72 hours. Hemoglobin A1C No results for input(s): "HGBA1C" in the last 72 hours. Fasting Lipid Panel Recent Labs    01/23/23 0405  TRIG 234*   Thyroid Function Tests No results for input(s): "TSH", "T4TOTAL", "T3FREE", "THYROIDAB" in the last 72 hours.  Invalid input(s): "FREET3"  Other results:   Imaging    No results found.   Medications:     Scheduled  Medications:  apixaban  5 mg Per Tube BID   Chlorhexidine Gluconate Cloth  6 each Topical Q0600   darbepoetin (ARANESP) injection - DIALYSIS  100 mcg Subcutaneous Q Mon-1800   lidocaine  1 patch Transdermal Q24H   melatonin  5 mg Per Tube QHS   midodrine  5 mg Per Tube Q8H   mouth rinse  15 mL Mouth Rinse Q2H   pantoprazole (PROTONIX) IV  40 mg Intravenous Q24H    Infusions:  sodium chloride Stopped (01/22/23 1859)   cefTRIAXone (ROCEPHIN)  IV Stopped (01/24/23 0920)   feeding supplement (VITAL 1.5 CAL) 30 mL/hr at 01/25/23 0700   fentaNYL infusion INTRAVENOUS 200 mcg/hr (01/25/23 0700)   norepinephrine (LEVOPHED) Adult infusion 5 mcg/min (01/25/23 0700)    prismasol BGK 2/2.5 dialysis solution 1,500 mL/hr at 01/25/23 0722   prismasol BGK 2/2.5 replacement solution 500 mL/hr at 01/25/23 1610   prismasol BGK 2/2.5 replacement solution 400 mL/hr at 01/25/23 0530   propofol (DIPRIVAN) infusion 40 mcg/kg/min (01/25/23 0700)    PRN Medications: sodium chloride, acetaminophen **OR** acetaminophen, fentaNYL, haloperidol lactate, ondansetron (ZOFRAN) IV, mouth rinse, polyethylene glycol, polyvinyl alcohol    Patient Profile   37 y.o. 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 Started on CRRT. BCx + for strep pneumo   Assessment/Plan   1.  Acute on chronic systolic CHF: Nonischemic CMP, LHC 2021 with no CAD. Previous hospitalizations w/shock in setting of rapid AFib/Flutter requiring milrinone. Most recent echo 5/24 was unchanged with EF 20-25%, RV difficult to visualize.  He signs off early from HD and is significantly volume overloaded this admission.  Suspect combination cardiogenic and septic shock at this time, currently on NE 5. CVP 14 - CVVH for volume removal, continue UF net 150 cc/hr.  - Continue NE as needed for MAP.   - Continue Midodrine 5 mg tid  - Not candidate for ICD given obesity and dialysis  - Not candidate for advanced therapies.  2. Atrial flutter/fibrillation: History of multiple DCCVs. Amiodarone previously stopped due to elevated LFTs. Restarted 05/20/22 by Dr. Elberta Fortis.  Concern for possible amiodarone pulmonary toxicity w/ elevated ESR and CRP 11/23. He has been off amiodarone and was treated with steroids.  AF ablation [facility] in 12/23. He was readmitted with AF/RVR but is now back  in NSR.  - Not candidate for amiodarone (lung and hepatic toxicity) or Tikosyn (ESRD), no anti-arrhythmic option.  - Continue Eliquis 5 mg bid  3. Possible amiodarone toxicity: Seen by pulmonary during admit 11/23. Had elevated ESR and sed rate. Treated with prednisone.   4. ESRD: HD per nephrology, on T,Th,Sat schedule.  Poor compliance with chronic treatment (signs off early).  - Significantly volume overloaded - CVVH, continue net UF 150 cc/hr.  5. ID/Bacteremia: Fever at admission, now afebrile. BCx + for strep pneumo. WBCs 26>>20>>11K  Concern for PNA.  - abx narrow to ceftriaxone per PCCM  6. OHS/OSA: Has been on CPAP and home oxygen.  7. Lower extremity wounds: - Per primary  - Wound consulted 8. Acute on chronic hypoxemic respiratory failure: Baseline OHS/OSA, now suspect possible PNA and CHF/volume overload. Intubated.  - Antibiotics as above.  - CVVH.  Length of Stay: [address] PA-C  01/25/2023, 7:53 AM  Advanced Heart Failure Team Pager [phone] (M-F; 7a - 5p)  Please contact [facility] for night-coverage after hours (5p -7a ) and weekends on [email or web address]  Patient seen with PA, agree with the above note.   He remains intubated, wakes up and follows commands with sedation weaning.   CVVH ongoing with UF 150 cc/hr net negative.  CVP 16.  Weight coming down.  He remains on NE 5 and midodrine 5 tid.   General: on vent Neck: Thick, JVP 14-16 cm, no thyromegaly or thyroid nodule.  Lungs: Decreased at bases.  CV: Nondisplaced PMI.  Heart regular S1/S2, no S3/S4, no murmur.  Trace ankle edema.   Abdomen: Soft, nontender, no hepatosplenomegaly, no distention.  Skin: Intact without lesions or rashes.  Neurologic: Will awaken and follow commands.  Extremities: No clubbing or cyanosis.  HEENT: Normal.   Continue ceftriaxone for Strep pneumoniae PNA.   Still volume overloaded, continue CVVH with UF 150 cc/hr net.    Wean NE as MAP tolerates, increase midodrine  to 10 tid to facilitate.   He remains in NSR on apixaban.   CRITICAL CARE Performed by: Marca Ancona  Total critical care time: 35 minutes  Critical care time was exclusive of separately billable procedures and treating other patients.  Critical care was necessary to treat or prevent imminent or life-threatening deterioration.  Critical care was time spent personally by me on the following activities: development of treatment plan with patient and/or surrogate as well as nursing, discussions with consultants, evaluation of patient's response to treatment, examination of patient, obtaining history from patient or surrogate, ordering and performing treatments and interventions, ordering and review of laboratory studies, ordering and review of radiographic studies, pulse oximetry and re-evaluation of patient's condition.  Marca Ancona 01/25/2023 8:13 AM

## 2023-01-26 DIAGNOSIS — I5023 Acute on chronic systolic (congestive) heart failure: Secondary | ICD-10-CM | POA: Diagnosis not present

## 2023-01-26 DIAGNOSIS — J13 Pneumonia due to Streptococcus pneumoniae: Secondary | ICD-10-CM | POA: Diagnosis not present

## 2023-01-26 DIAGNOSIS — J9601 Acute respiratory failure with hypoxia: Secondary | ICD-10-CM | POA: Diagnosis not present

## 2023-01-26 DIAGNOSIS — I4892 Unspecified atrial flutter: Secondary | ICD-10-CM | POA: Diagnosis not present

## 2023-01-26 DIAGNOSIS — Z9911 Dependence on respirator [ventilator] status: Secondary | ICD-10-CM | POA: Diagnosis not present

## 2023-01-26 LAB — RENAL FUNCTION PANEL
Albumin: 3.4 g/dL — ABNORMAL LOW (ref 3.5–5.0)
Albumin: 3.6 g/dL (ref 3.5–5.0)
Anion gap: 13 (ref 5–15)
Anion gap: 16 — ABNORMAL HIGH (ref 5–15)
BUN: 31 mg/dL — ABNORMAL HIGH (ref 6–20)
BUN: 31 mg/dL — ABNORMAL HIGH (ref 6–20)
CO2: 23 mmol/L (ref 22–32)
CO2: 19 mmol/L — ABNORMAL LOW (ref 22–32)
Calcium: 9.8 mg/dL (ref 8.9–10.3)
Calcium: 10.3 mg/dL (ref 8.9–10.3)
Chloride: 95 mmol/L — ABNORMAL LOW (ref 98–111)
Chloride: 97 mmol/L — ABNORMAL LOW (ref 98–111)
Creatinine, Ser: 3.29 mg/dL — ABNORMAL HIGH (ref 0.61–1.24)
Creatinine, Ser: 3.11 mg/dL — ABNORMAL HIGH (ref 0.61–1.24)
GFR, Estimated: 24 mL/min — ABNORMAL LOW (ref >60)
GFR, Estimated: 25 mL/min — ABNORMAL LOW (ref >60)
Glucose, Bld: 120 mg/dL — ABNORMAL HIGH (ref 70–99)
Glucose, Bld: 133 mg/dL — ABNORMAL HIGH (ref 70–99)
Phosphorus: 3.6 mg/dL (ref 2.5–4.6)
Phosphorus: 3.6 mg/dL (ref 2.5–4.6)
Potassium: 3.7 mmol/L (ref 3.5–5.1)
Potassium: 4.4 mmol/L (ref 3.5–5.1)
Sodium: 131 mmol/L — ABNORMAL LOW (ref 135–145)
Sodium: 132 mmol/L — ABNORMAL LOW (ref 135–145)

## 2023-01-26 LAB — GLUCOSE, CAPILLARY
Glucose-Capillary: 106 mg/dL — ABNORMAL HIGH (ref 70–99)
Glucose-Capillary: 108 mg/dL — ABNORMAL HIGH (ref 70–99)
Glucose-Capillary: 113 mg/dL — ABNORMAL HIGH (ref 70–99)
Glucose-Capillary: 128 mg/dL — ABNORMAL HIGH (ref 70–99)
Glucose-Capillary: 137 mg/dL — ABNORMAL HIGH (ref 70–99)
Glucose-Capillary: 86 mg/dL (ref 70–99)
Glucose-Capillary: 92 mg/dL (ref 70–99)
Glucose-Capillary: 99 mg/dL (ref 70–99)

## 2023-01-26 LAB — CBC
HCT: 32.9 % — ABNORMAL LOW (ref 39.0–52.0)
Hemoglobin: 10.7 g/dL — ABNORMAL LOW (ref 13.0–17.0)
MCH: 31.9 pg (ref 26.0–34.0)
MCHC: 32.5 g/dL (ref 30.0–36.0)
MCV: 98.2 fL (ref 80.0–100.0)
Platelets: 269 10*3/uL (ref 150–400)
RBC: 3.35 MIL/uL — ABNORMAL LOW (ref 4.22–5.81)
RDW: 16 % — ABNORMAL HIGH (ref 11.5–15.5)
WBC: 10.2 10*3/uL (ref 4.0–10.5)
nRBC: 0.7 % — ABNORMAL HIGH (ref 0.0–0.2)

## 2023-01-26 LAB — MAGNESIUM: Magnesium: 2.7 mg/dL — ABNORMAL HIGH (ref 1.7–2.4)

## 2023-01-26 LAB — COOXEMETRY PANEL
Carboxyhemoglobin: 1.9 % — ABNORMAL HIGH (ref 0.5–1.5)
Methemoglobin: <0.7 % (ref 0.0–1.5)
O2 Saturation: 54.1 %
Total hemoglobin: 11 g/dL — ABNORMAL LOW (ref 12.0–16.0)

## 2023-01-26 LAB — TRIGLYCERIDES: Triglycerides: 402 mg/dL — ABNORMAL HIGH (ref <150)

## 2023-01-26 MED ORDER — MIDODRINE HCL 5 MG PO TABS
15.0000 mg | ORAL_TABLET | Freq: Three times a day (TID) | ORAL | Status: DC
  Administered 2023-01-26 – 2023-01-30 (×13): 15 mg
  Filled 2023-01-26 (×13): qty 3

## 2023-01-26 NOTE — Progress Notes (Signed)
[facility] NEPHROLOGY PROGRESS NOTE  Assessment/ Plan: Pt is a 37 y.o. yo male chronic CHF, PAF, morbid obesity, OSA, ESRD on HD with pneumonia and CHF exacerbation.  OP HD orders: [facility], TTS. 4hrs 15 min. F180. Flow rates: 425/autoflow 1.5. 3k 2.5cal. EDW 180kg. Access: TDC. Meds: Mircers 200 mcg every two weeks (last dose 6/13), sensipar 60mg  three times per week, hectorol three times per week . Historically noncompliant with HD I.e. doesn't stay for full prescribed treatment times, high IDWG/does not adhere to fluid restriction   # ESRD: Required emergent HD on 6/29 because of fluid overload.  He is on Levophed for hypotension/shock.  Started CRRT on 6/29 with UF goal 100-150 cc an hour as tolerated by BP.   CVP around 13-15 therefore continue CRRT with UF around 150 cc an hour net UF.  All 4K bath.  Monitor lab.  Discussed with ICU nurse.  # Acute on chronic systolic CHF, nonischemic: EF 02-72%.  Optimize volume with CRRT.  Discussed with the cardiology team.  Not a candidate for ICD and advanced therapies for CHF because of obesity and dialysis.  # Sepsis/septic shock: Currently on broad-spectrum antibiotics, per primary team.  Levophed as a pressor.  Adding midodrine.  # Anemia of CKD: Hemoglobin at goal.  Received Aranesp on 7/1.  Subjective: Seen and examined in ICU.  Remains intubated, sedated.  Weaning Levophed, back on sedatives.  Tolerating CRRT well without any issues.  Dialysate bath changed to 4K last night. . Objective Vital signs in last 24 hours: Vitals:   01/26/23 0815 01/26/23 0830 01/26/23 0845 01/26/23 0900  BP:    103/69  Pulse: 78 82 75 74  Resp: (!) 24 (!) 24 (!) 24 (!) 24  Temp:      TempSrc:      SpO2: 96% 99% 97% 98%  Weight:      Height:       Weight change: -1.8 kg  Intake/Output Summary (Last 24 hours) at 01/26/2023 0942 Last data filed at 01/26/2023 0915 Gross per 24 hour  Intake 3226.29 ml  Output 6295.2 ml  Net -3068.91 ml         Labs: RENAL PANEL Recent Labs  Lab 01/23/23 0405 01/23/23 1511 01/24/23 0419 01/24/23 1520 01/25/23 0335 01/25/23 1611 01/26/23 0351 01/26/23 0352  NA 132*   < > 133* 132* 133* 131*  --  131*  K 4.9   < > 4.2 3.9 3.5 3.1*  --  3.7  CL 95*   < > 92* 97* 95* 97*  --  95*  CO2 21*   < > 22 20* 23 20*  --  23  GLUCOSE 113*   < > 143* 140* 117* 122*  --  120*  BUN 44*   < > 36* 31* 29* 28*  --  31*  CREATININE 7.38*   < > 4.76* 4.02* 3.60* 3.32*  --  3.29*  CALCIUM 9.0   < > 9.3 9.0 9.1 8.8*  --  9.8  MG 2.2  --  2.6*  --  2.8*  --  2.7*  --   PHOS 5.7*   < > 5.4* 4.5 3.7 3.5  --  3.6  ALBUMIN 3.3*   < > 3.2* 3.1* 3.2* 3.1*  --  3.4*   < > = values in this interval not displayed.     Liver Function Tests: Recent Labs  Lab 01/21/23 1739 01/21/23 1842 01/25/23 0335 01/25/23 1611 01/26/23 0352  AST 18  --   --   --   --  ALT 11  --   --   --   --   ALKPHOS 97  --   --   --   --   BILITOT 2.3*  --   --   --   --   PROT 7.6  --   --   --   --   ALBUMIN 3.4*   < > 3.2* 3.1* 3.4*   < > = values in this interval not displayed.    No results for input(s): "LIPASE", "AMYLASE" in the last 168 hours. No results for input(s): "AMMONIA" in the last 168 hours. CBC: Recent Labs    03/26/22 1958 03/27/22 0300 06/04/22 1924 06/05/22 0154 08/09/22 0042 08/10/22 0107 01/22/23 2014 01/23/23 0405 01/24/23 0419 01/25/23 0335 01/26/23 0351  HGB  --    < >  --    < > 6.7*   < > 11.6* 10.2* 9.7* 10.0* 10.7*  MCV  --    < >  --    < > 106.1*   < >  --  100.6* 98.7 98.7 98.2  VITAMINB12 297  --  5,998*  --   --   --   --   --   --   --   --   FOLATE  --   --  15.7  --   --   --   --   --   --   --   --   FERRITIN 370*  --   --   --  156  --   --   --   --   --   --   TIBC 321  --   --   --  330  --   --   --   --   --   --   IRON 27*  --   --   --  28*  --   --   --   --   --   --    < > = values in this interval not displayed.     Cardiac Enzymes: No results for  input(s): "CKTOTAL", "CKMB", "CKMBINDEX", "TROPONINI" in the last 168 hours. CBG: Recent Labs  Lab 01/25/23 1552 01/25/23 1939 01/26/23 0012 01/26/23 0357 01/26/23 0728  GLUCAP 113* 92 106* 108* 86     Iron Studies: No results for input(s): "IRON", "TIBC", "TRANSFERRIN", "FERRITIN" in the last 72 hours. Studies/Results: ECHOCARDIOGRAM COMPLETE  Result Date: 01/25/2023    ECHOCARDIOGRAM REPORT   Patient Name:   [facility] Date of Exam: 01/25/2023 Medical Rec #:  [id number]      Height:       72.0 in Accession #:    [id number]     Weight:       397.3 lb Date of Birth:  1985/12/07       BSA:          2.852 m Patient Age:    36 years       BP:           115/83 mmHg Patient Gender: M              HR:           30 bpm. Exam Location:  Inpatient Procedure: 2D Echo, Cardiac Doppler and Color Doppler Indications:    Bacteremia R78.81  History:        Patient has prior history of Echocardiogram examinations, most  recent 12/24/2022. CHF, Arrythmias:Atrial Fibrillation and Atrial                 Flutter, Signs/Symptoms:Hypotension; Risk Factors:Sleep Apnea                 and Diabetes. ESRD.  Sonographer:    Lucendia Herrlich Referring Phys: [id number] LAURA P CLARK  Sonographer Comments: Suboptimal parasternal window, no apical window, echo performed with patient supine and on artificial respirator and patient is obese. IMPRESSIONS  1. Left ventricular ejection fraction, by estimation, is 20 to 25%. The left ventricle has severely decreased function. The left ventricle has no regional wall motion abnormalities. The left ventricular internal cavity size was mildly dilated. There is mild concentric left ventricular hypertrophy. Left ventricular diastolic function could not be evaluated.  2. Right ventricular systolic function is low normal. The right ventricular size is normal. Mildly increased right ventricular wall thickness. Tricuspid regurgitation signal is inadequate for assessing PA pressure.  3.  The mitral valve is grossly normal. Trivial mitral valve regurgitation. No evidence of mitral stenosis.  4. The aortic valve was not well visualized. Aortic valve regurgitation is not visualized.  5. The inferior vena cava is dilated in size with <50% respiratory variability, suggesting right atrial pressure of 15 mmHg. Comparison(s): No significant change from prior study. Prior images reviewed side by side. FINDINGS  Left Ventricle: Left ventricular ejection fraction, by estimation, is 20 to 25%. The left ventricle has severely decreased function. The left ventricle has no regional wall motion abnormalities. The left ventricular internal cavity size was mildly dilated. There is mild concentric left ventricular hypertrophy. Left ventricular diastolic function could not be evaluated. Right Ventricle: The right ventricular size is normal. Mildly increased right ventricular wall thickness. Right ventricular systolic function is low normal. Tricuspid regurgitation signal is inadequate for assessing PA pressure. Left Atrium: Left atrial size was not well visualized. Right Atrium: Right atrial size was not well visualized. Pericardium: Trivial pericardial effusion is present. The pericardial effusion is posterior to the left ventricle. Mitral Valve: The mitral valve is grossly normal. Trivial mitral valve regurgitation. No evidence of mitral valve stenosis. Tricuspid Valve: The tricuspid valve is not well visualized. Tricuspid valve regurgitation is mild . No evidence of tricuspid stenosis. Aortic Valve: The aortic valve was not well visualized. Aortic valve regurgitation is not visualized. Pulmonic Valve: The pulmonic valve was not well visualized. Pulmonic valve regurgitation is mild. No evidence of pulmonic stenosis. Aorta: The aortic root and ascending aorta are structurally normal, with no evidence of dilitation. Venous: The inferior vena cava is dilated in size with less than 50% respiratory variability, suggesting  right atrial pressure of 15 mmHg. IAS/Shunts: The interatrial septum was not well visualized.  LEFT VENTRICLE PLAX 2D LVIDd:         6.20 cm LVIDs:         5.60 cm LV PW:         1.00 cm LV IVS:        1.20 cm LVOT diam:     2.40 cm LVOT Area:     4.52 cm  IVC IVC diam: 2.70 cm LEFT ATRIUM         Index LA diam:    4.20 cm 1.47 cm/m                        PULMONIC VALVE AORTA  PR End Diast Vel: 13.84 msec Ao Root diam: 3.50 cm Ao Asc diam:  3.00 cm TRICUSPID VALVE TR Peak grad:   17.3 mmHg TR Vmax:        208.00 cm/s  SHUNTS Systemic Diam: 2.40 cm Riley Lam MD Electronically signed by Riley Lam MD Signature Date/Time: 01/25/2023/9:17:15 AM    Final     Medications: Infusions:   prismasol BGK 4/2.5 400 mL/hr at 01/25/23 2229    prismasol BGK 4/2.5 400 mL/hr at 01/25/23 2227   sodium chloride Stopped (01/22/23 1859)   cefTRIAXone (ROCEPHIN)  IV Stopped (01/25/23 1048)   feeding supplement (VITAL 1.5 CAL) 55 mL/hr at 01/26/23 0900   fentaNYL infusion INTRAVENOUS 200 mcg/hr (01/26/23 0900)   norepinephrine (LEVOPHED) Adult infusion 4 mcg/min (01/26/23 0900)   prismasol BGK 4/2.5 1,500 mL/hr at 01/26/23 0808   propofol (DIPRIVAN) infusion 40 mcg/kg/min (01/26/23 0934)    Scheduled Medications:  apixaban  5 mg Per Tube BID   Chlorhexidine Gluconate Cloth  6 each Topical Q0600   darbepoetin (ARANESP) injection - DIALYSIS  100 mcg Subcutaneous Q Mon-1800   feeding supplement (PROSource TF20)  60 mL Per Tube QID   lidocaine  1 patch Transdermal Q24H   melatonin  5 mg Per Tube QHS   midodrine  15 mg Per Tube Q8H   multivitamin  1 tablet Per Tube QHS   mouth rinse  15 mL Mouth Rinse Q2H   pantoprazole (PROTONIX) IV  40 mg Intravenous Q24H    have reviewed scheduled and prn medications.  Physical Exam: General: Ill looking male, intubated, sedated, CVP around 13. Heart:RRR, s1s2 nl Lungs: Coarse breath sound bilateral Abdomen:soft, Non-tender,  non-distended Extremities: Bilateral leg edema presents Dialysis Access: RIJ TDC in place.  Site clean.  [facility] 01/26/2023,9:42 AM  LOS: 5 days

## 2023-01-26 NOTE — Progress Notes (Signed)
NAME:  Phillip [facility], MRN:  [id number], DOB:  1986-05-02, LOS: 5 ADMISSION DATE:  01/21/2023, CONSULTATION DATE:  01/22/23 REFERRING MD:  Danise Edge, CHIEF COMPLAINT:  SOB   History of Present Illness:  37 year old man well known to the PCCM and AHF services who presented to [facility] 6/28 with fevers, joint pains, SOB. PMHx significant for ESRD, NICM, refractory AFib, ?amio-induced lung toxicity. Patient reportedly missed HD on Thursday (has a history of leaving HD early). Reportedly ~10kg over dry weight with worsening breathing status, AFib/RVR and borderline BP.  PCCM consulted for ICU admission/further evaluation and management.  Pertinent Medical History:   Past Medical History:  Diagnosis Date   Acute on chronic respiratory failure with hypoxia ([name]) 04/21/2021   Acute on chronic systolic (congestive) heart failure ([name]) 02/26/2020   Amiodarone toxicity    Anemia    Atrial flutter ([name])    Biventricular congestive heart failure ([name])    Chronic hypoxemic respiratory failure ([name])    Class 3 severe obesity due to excess calories with serious comorbidity and body mass index (BMI) of 50.0 to 59.9 in adult [facility]) 02/26/2020   Essential hypertension 02/26/2020   GERD without esophagitis 02/26/2020   Hidradenitis suppurativa 02/26/2020   NICM (nonischemic cardiomyopathy) ([name])    Obesity hypoventilation syndrome ([name])    OSA (obstructive sleep apnea)    PAF (paroxysmal atrial fibrillation) ([name])    Pneumonia    Prediabetes 02/26/2020   Significant Hospital Events: Including procedures, antibiotic start and stop dates in addition to other pertinent events   6/28 Admit, emergent HD 6/29 Afib/RVR, shock, ICU transfer 6/30 Intubated and placed on vasopressor support and started on CRRT 7/1 Remains intubated on CRRT continue with pressor support 7/2 Agitated with SAT, remains on CRRT and low dose levophed  Interim History / Subjective:  No significant events overnight Unable to wean  completely from NE, down to 3-73mcg CRRT ongoing, pulling volume as tolerated Remains intubated, sedated  Objective:  Blood pressure 126/71, pulse 77, temperature 97.6 F (36.4 C), temperature source Axillary, resp. rate (!) 24, height 6' (1.829 m), weight (!) 178.4 kg, SpO2 100 %. CVP:  [8 mmHg-19 mmHg] 10 mmHg  Vent Mode: PRVC FiO2 (%):  [30 %-40 %] 30 % Set Rate:  [24 bmp] 24 bmp Vt Set:  [620 mL] 620 mL PEEP:  [5 cmH20] 5 cmH20 Plateau Pressure:  [21 cmH20-29 cmH20] 21 cmH20   Intake/Output Summary (Last 24 hours) at 01/26/2023 1143 Last data filed at 01/26/2023 1100 Gross per 24 hour  Intake 3287.22 ml  Output 6545.2 ml  Net -3257.98 ml    Filed Weights   01/24/23 0406 01/25/23 0333 01/26/23 0500  Weight: (!) 186.2 kg (!) 180.2 kg (!) 178.4 kg   Physical Examination: General: Acutely ill-appearing man in NAD. Intubated, sedated. HEENT: Pinch/AT, anicteric sclera, PERRL 2mm, moist mucous membranes. Neuro: Sedated. Does not respond to verbal, tactile or noxious stimuli. +Withdraws/grimaces to pain.Not following commands. No significant spontaneous movement noted. +Corneal, +Cough, and +Gag  CV: RRR, no m/g/r. PULM: Breathing even and unlabored on vent (PEEP 5, FiO2 30%). Lung fields CTAB anteriorly. GI: Obese, soft, nontender,mildly distended. Hypoactive bowel sounds. Extremities: Bilateral symmetric 1-2+ LE edema noted. Skin: Warm/dry, no rashes. Gross anasarca/body wall edema.  Resolved Hospital Problem List:  N/A  Assessment & Plan:   Acute-on-chronic hypoxemic respiratory failure, multifactorial including pneumonia and volume overload due to noncompliance with hemodialysis History of OHS/OSA Patient presented with shortness of breath, he was noted to be volume  overloaded, he missed hemodialysis session but usually does not complete hemodialysis. Noted to be volume overloaded with CXR suggestive of bilateral lower lobe pneumonia. - Remains in ICU - Continue full vent  support (4-8cc/kg IBW) - Wean FiO2 for O2 sat > 90% - Daily WUA/SBT once appropriate from a mental status standpoint - Volume removal with CRRT - VAP bundle - Pulmonary hygiene - PAD protocol for sedation: Propofol and Fentanyl for goal RASS 0 to -1 - Continue ceftriaxone  Septic shock due to strep pneumo bacteremia, POA BCx grew strep pneumo, likely secondary to bilateral lower lobe pneumonia. - Goal MAP > 65 - Avoid fluid resuscitation in the setting of NICM/HF - Levophed titrated to goal MAP - Trend WBC, fever curve - F/u Cx data - Continue broad-spectrum antibiotics (ceftriaxone)  Acute on chronic HFrEF Nonischemic cardiomyopathy -Echocardiogram 5/24 with unchanged EF of 20 to 25% History of paroxysmal rapid A-fib/flutter with history of possible amiodarone toxicity treated with steroids History of multiple DCCVs with history of amiodarone toxicity. - AHF team following, appreciate assistance - Per AHF, not a candidate for advanced therapies at this time - Cardiac monitoring - Optimize electrolytes for K > 4, Mg > 2 - Strict I&Os, daily weights - Assess diuresis needs daily, CRRT with volume removal - Continue Eliquis  End-stage renal disease on hemodialysis Hypervolemic hyponatremia -improving Hyperphosphatemia Hyperkalemia - improved High anion gap metabolic acidosis On HD T/Th/Sa but is often noncompliant with sessions. - Nephro following, appreciate assistance - Trend BMP, phos, Mg - Replete electrolytes as indicated - Monitor I&Os - Avoid nephrotoxic agents as able - Ensure adequate renal perfusion  Acute septic encephalopathy - Neuroprotective measures: HOB > 30 degrees, normoglycemia, normothermia, electrolytes WNL - Seizure/aspiration precautions - Minimize sedating medications as able - Correct metabolic derangements - Defer neurologic imaging at this time  Hematuria POA No hydronephrosis, medical renal disease seen on renal ultrasound. Possible  traumatic Foley insertion - Foley out - May need outpatient Uro follow-up  Morbid obesity At risk for malnutrition - Continue TF - RD/Nutritions following, appreciate assistance - Protein supplements  Goals of care Mother updated at bedside 6/30 with decision to continue with aggressive interventions, given multisystem organ involvement with medication and medical noncompliance needs ongoing goals of care discussions.  Best Practice (right click and "Reselect all SmartList Selections" daily)   Diet/type: Trickle tube feeds DVT prophylaxis: DOAC GI prophylaxis: Protonix Lines: Yes still needed Foley: Yes still needed Code Status:  full code Last date of multidisciplinary goals of care: 6/30  Critical care time:   The patient is critically ill with multiple organ system failure and requires high complexity decision making for assessment and support, frequent evaluation and titration of therapies, advanced monitoring, review of radiographic studies and interpretation of complex data.   Critical Care Time devoted to patient care services, exclusive of separately billable procedures, described in this note is 37 minutes.  Tim Lair, PA-C [facility] 01/26/23 11:44 AM  Please see [email or web address] for pager details.  From 7A-7P if no response, please call [phone] After hours, please call ELink [phone]

## 2023-01-26 NOTE — Progress Notes (Addendum)
Advanced Heart Failure Rounding Note  PCP-Cardiologist: Marca Ancona, MD   Subjective:    Remains intubated and sedated.   BCx + for strep pneumo. Abx narrowed to ceftriaxone. Afebrile. WBC 26>>20>>11K   On CVVHD, currently pulling 150cc/hr. Additional 6.3L volume removal yesterday. CVP 9  On NE 5 + midodrine 10 mg tid   Maintaining NSR.   Objective:   Weight Range: (!) 178.4 kg Body mass index is 53.34 kg/m.   Vital Signs:   Temp:  [97.3 F (36.3 C)-98.4 F (36.9 C)] 98.4 F (36.9 C) (07/03 0700) Pulse Rate:  [45-95] 70 (07/03 0700) Resp:  [18-25] 24 (07/03 0700) BP: (87-117)/(49-83) 98/64 (07/03 0600) SpO2:  [94 %-100 %] 99 % (07/03 0700) Arterial Line BP: (86-126)/(42-76) 104/56 (07/03 0700) FiO2 (%):  [40 %] 40 % (07/03 0304) Weight:  [178.4 kg] 178.4 kg (07/03 0500) Last BM Date : 01/21/23  Weight change: Filed Weights   01/24/23 0406 01/25/23 0333 01/26/23 0500  Weight: (!) 186.2 kg (!) 180.2 kg (!) 178.4 kg    Intake/Output:   Intake/Output Summary (Last 24 hours) at 01/26/2023 0746 Last data filed at 01/26/2023 0700 Gross per 24 hour  Intake 3097.88 ml  Output 6322.7 ml  Net -3224.82 ml      Physical Exam    CVP 9  General:  intubated and sedated  HEENT: normal + ETT  Neck: supple. Thick neck, unable to visualize CVP. Carotids 2+ bilat; no bruits. No lymphadenopathy or thyromegaly appreciated. Cor: PMI nondisplaced. Regular rate & rhythm. No rubs, gallops or murmurs. Lungs: clear Abdomen: obese, soft, nontender, nondistended. No hepatosplenomegaly. No bruits or masses. Good bowel sounds. Extremities: no cyanosis, clubbing, rash, edema Neuro: intubated and sedated    Telemetry   NSR 90s  EKG    N/A   Labs    CBC Recent Labs    01/25/23 0335 01/26/23 0351  WBC 11.2* 10.2  HGB 10.0* 10.7*  HCT 31.0* 32.9*  MCV 98.7 98.2  PLT 280 269   Basic Metabolic Panel Recent Labs    65/78/46 0335 01/25/23 1611 01/26/23 0351  01/26/23 0352  NA 133* 131*  --  131*  K 3.5 3.1*  --  3.7  CL 95* 97*  --  95*  CO2 23 20*  --  23  GLUCOSE 117* 122*  --  120*  BUN 29* 28*  --  31*  CREATININE 3.60* 3.32*  --  3.29*  CALCIUM 9.1 8.8*  --  9.8  MG 2.8*  --  2.7*  --   PHOS 3.7 3.5  --  3.6   Liver Function Tests Recent Labs    01/25/23 1611 01/26/23 0352  ALBUMIN 3.1* 3.4*   No results for input(s): "LIPASE", "AMYLASE" in the last 72 hours. Cardiac Enzymes No results for input(s): "CKTOTAL", "CKMB", "CKMBINDEX", "TROPONINI" in the last 72 hours.  BNP: BNP (last 3 results) Recent Labs    05/22/22 0732 08/05/22 1720 01/21/23 1739  BNP 373.1* 15.5 1,528.6*    ProBNP (last 3 results) No results for input(s): "PROBNP" in the last 8760 hours.   D-Dimer No results for input(s): "DDIMER" in the last 72 hours. Hemoglobin A1C No results for input(s): "HGBA1C" in the last 72 hours. Fasting Lipid Panel Recent Labs    01/26/23 0352  TRIG 402*   Thyroid Function Tests No results for input(s): "TSH", "T4TOTAL", "T3FREE", "THYROIDAB" in the last 72 hours.  Invalid input(s): "FREET3"  Other results:   Imaging  ECHOCARDIOGRAM COMPLETE  Result Date: 01/25/2023    ECHOCARDIOGRAM REPORT   Patient Name:   Phillip Young Date of Exam: 01/25/2023 Medical Rec #:  [id number]      Height:       72.0 in Accession #:    [id number]     Weight:       397.3 lb Date of Birth:  24-Jun-1986       BSA:          2.852 m Patient Age:    36 years       BP:           115/83 mmHg Patient Gender: M              HR:           30 bpm. Exam Location:  Inpatient Procedure: 2D Echo, Cardiac Doppler and Color Doppler Indications:    Bacteremia R78.81  History:        Patient has prior history of Echocardiogram examinations, most                 recent 12/24/2022. CHF, Arrythmias:Atrial Fibrillation and Atrial                 Flutter, Signs/Symptoms:Hypotension; Risk Factors:Sleep Apnea                 and Diabetes. ESRD.  Sonographer:     Lucendia Herrlich Referring Phys: [id number] LAURA P CLARK  Sonographer Comments: Suboptimal parasternal window, no apical window, echo performed with patient supine and on artificial respirator and patient is obese. IMPRESSIONS  1. Left ventricular ejection fraction, by estimation, is 20 to 25%. The left ventricle has severely decreased function. The left ventricle has no regional wall motion abnormalities. The left ventricular internal cavity size was mildly dilated. There is mild concentric left ventricular hypertrophy. Left ventricular diastolic function could not be evaluated.  2. Right ventricular systolic function is low normal. The right ventricular size is normal. Mildly increased right ventricular wall thickness. Tricuspid regurgitation signal is inadequate for assessing PA pressure.  3. The mitral valve is grossly normal. Trivial mitral valve regurgitation. No evidence of mitral stenosis.  4. The aortic valve was not well visualized. Aortic valve regurgitation is not visualized.  5. The inferior vena cava is dilated in size with <50% respiratory variability, suggesting right atrial pressure of 15 mmHg. Comparison(s): No significant change from prior study. Prior images reviewed side by side. FINDINGS  Left Ventricle: Left ventricular ejection fraction, by estimation, is 20 to 25%. The left ventricle has severely decreased function. The left ventricle has no regional wall motion abnormalities. The left ventricular internal cavity size was mildly dilated. There is mild concentric left ventricular hypertrophy. Left ventricular diastolic function could not be evaluated. Right Ventricle: The right ventricular size is normal. Mildly increased right ventricular wall thickness. Right ventricular systolic function is low normal. Tricuspid regurgitation signal is inadequate for assessing PA pressure. Left Atrium: Left atrial size was not well visualized. Right Atrium: Right atrial size was not well visualized.  Pericardium: Trivial pericardial effusion is present. The pericardial effusion is posterior to the left ventricle. Mitral Valve: The mitral valve is grossly normal. Trivial mitral valve regurgitation. No evidence of mitral valve stenosis. Tricuspid Valve: The tricuspid valve is not well visualized. Tricuspid valve regurgitation is mild . No evidence of tricuspid stenosis. Aortic Valve: The aortic valve was not well visualized. Aortic valve regurgitation is not visualized. Pulmonic Valve: The pulmonic valve was not  well visualized. Pulmonic valve regurgitation is mild. No evidence of pulmonic stenosis. Aorta: The aortic root and ascending aorta are structurally normal, with no evidence of dilitation. Venous: The inferior vena cava is dilated in size with less than 50% respiratory variability, suggesting right atrial pressure of 15 mmHg. IAS/Shunts: The interatrial septum was not well visualized.  LEFT VENTRICLE PLAX 2D LVIDd:         6.20 cm LVIDs:         5.60 cm LV PW:         1.00 cm LV IVS:        1.20 cm LVOT diam:     2.40 cm LVOT Area:     4.52 cm  IVC IVC diam: 2.70 cm LEFT ATRIUM         Index LA diam:    4.20 cm 1.47 cm/m                        PULMONIC VALVE AORTA                 PR End Diast Vel: 13.84 msec Ao Root diam: 3.50 cm Ao Asc diam:  3.00 cm TRICUSPID VALVE TR Peak grad:   17.3 mmHg TR Vmax:        208.00 cm/s  SHUNTS Systemic Diam: 2.40 cm Riley Lam MD Electronically signed by Riley Lam MD Signature Date/Time: 01/25/2023/9:17:15 AM    Final      Medications:     Scheduled Medications:  apixaban  5 mg Per Tube BID   Chlorhexidine Gluconate Cloth  6 each Topical Q0600   darbepoetin (ARANESP) injection - DIALYSIS  100 mcg Subcutaneous Q Mon-1800   feeding supplement (PROSource TF20)  60 mL Per Tube QID   lidocaine  1 patch Transdermal Q24H   melatonin  5 mg Per Tube QHS   midodrine  10 mg Per Tube Q8H   multivitamin  1 tablet Per Tube QHS   mouth rinse  15 mL  Mouth Rinse Q2H   pantoprazole (PROTONIX) IV  40 mg Intravenous Q24H    Infusions:   prismasol BGK 4/2.5 400 mL/hr at 01/25/23 2229    prismasol BGK 4/2.5 400 mL/hr at 01/25/23 2227   sodium chloride Stopped (01/22/23 1859)   cefTRIAXone (ROCEPHIN)  IV Stopped (01/25/23 1048)   feeding supplement (VITAL 1.5 CAL) 55 mL/hr at 01/26/23 0700   fentaNYL infusion INTRAVENOUS 150 mcg/hr (01/26/23 0700)   norepinephrine (LEVOPHED) Adult infusion 5 mcg/min (01/26/23 0700)   prismasol BGK 4/2.5 1,500 mL/hr at 01/26/23 0444   propofol (DIPRIVAN) infusion 30 mcg/kg/min (01/26/23 0700)    PRN Medications: sodium chloride, acetaminophen **OR** acetaminophen, fentaNYL, haloperidol lactate, ondansetron (ZOFRAN) IV, mouth rinse, polyethylene glycol, polyvinyl alcohol    Patient Profile   37 y.o. 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 Started on CRRT. BCx + for strep pneumo   Assessment/Plan   1.  Acute on chronic systolic CHF: Nonischemic CMP, LHC 2021 with no CAD. Previous hospitalizations w/shock in setting of rapid AFib/Flutter requiring milrinone. Most recent echo 5/24 was unchanged with EF 20-25%, RV difficult to visualize.  He signs off early from  HD and is significantly volume overloaded this admission. Suspect combination cardiogenic and septic shock at this time, currently on NE 5. CVP 9 - Continue CVVH for volume removal, continue UF net 150 cc/hr.  - Hopefully able to extubate in next 12-24 hr  - Continue NE as needed for MAP.   - Continue Midodrine 10 mg tid  - Not candidate for ICD given obesity and dialysis  - Not candidate for advanced  therapies.  2. Atrial flutter/fibrillation: History of multiple DCCVs. Amiodarone previously stopped due to elevated LFTs. Restarted 05/20/22 by Dr. Elberta Fortis.  Concern for possible amiodarone pulmonary toxicity w/ elevated ESR and CRP 11/23. He has been off amiodarone and was treated with steroids.  AF ablation at [facility] in 12/23. He was readmitted with AF/RVR but is now back in NSR.  - Not candidate for amiodarone (lung and hepatic toxicity) or Tikosyn (ESRD), no anti-arrhythmic option.  - Continue Eliquis 5 mg bid  3. Possible amiodarone toxicity: Seen by pulmonary during admit 11/23. Had elevated ESR and sed rate. Treated with prednisone.   4. ESRD: HD per nephrology, on T,Th,Sat schedule.  Poor compliance with chronic treatment (signs off early).  - Significantly volume overloaded - CVVH, continue net UF 150 cc/hr.  5. ID/Bacteremia: Fever at admission, now afebrile. BCx + for strep pneumo. WBCs 26>>20>>11>>10K  Treating for PNA.  - abx narrow to ceftriaxone per PCCM  6. OHS/OSA: Has been on CPAP and home oxygen.  7. Lower extremity wounds: - Per primary  - Wound consulted 8. Acute on chronic hypoxemic respiratory failure: Baseline OHS/OSA, now suspect possible PNA and CHF/volume overload. Intubated.  - Antibiotics as above.  - CVVH.   Length of Stay: [phone] PA-C  01/26/2023, 7:46 AM  Advanced Heart Failure Team Pager [phone] (M-F; 7a - 5p)  Please contact [facility] for night-coverage after hours (5p -7a ) and weekends on [email or web address]  Patient seen with PA, agree with the above note.   He remains on vent.  CVVH ongoing, pulling 150 cc/hr net negative UF.  Weight trending down. I still get a CVP of 15.  He remains in NSR.  NE at 5 continues.   General: Intubated Neck: Thick, JVP 12+, no thyromegaly or thyroid nodule.  Lungs: Clear to auscultation bilaterally with normal respiratory effort. CV: Nondisplaced PMI.  Heart regular S1/S2, no S3/S4, no murmur.  1+  ankle edema.  Abdomen: Soft, nontender, no hepatosplenomegaly, no distention.  Skin: Intact without lesions or rashes.  Neurologic: Sedated on vent, will awaken.  Extremities: No clubbing or cyanosis.  HEENT: Normal.   Continue ceftriaxone for Strep pneumoniae PNA.    Still volume overloaded with CVP 15.  Would get him as dry as possible before stopping CVVH.  Continue current UF today (150 cc/hr net negative).   Increase midodrine to 15 mg tid, titrate down NE as able.   He remains in NSR.   CRITICAL CARE Performed by: Marca Ancona  Total critical care time: 35 minutes  Critical care time was exclusive of separately billable procedures and treating other patients.  Critical care was necessary to treat or prevent imminent or life-threatening deterioration.  Critical care was time spent personally by me on the following activities: development of treatment plan with patient and/or surrogate as well as nursing, discussions with consultants, evaluation of patient's response to treatment, examination of patient, obtaining history from patient or surrogate, ordering and performing treatments and interventions, ordering and review of laboratory studies, ordering and  review of radiographic studies, pulse oximetry and re-evaluation of patient's condition.  Marca Ancona 01/26/2023 8:11 AM

## 2023-01-27 DIAGNOSIS — Z515 Encounter for palliative care: Secondary | ICD-10-CM

## 2023-01-27 DIAGNOSIS — A419 Sepsis, unspecified organism: Secondary | ICD-10-CM | POA: Diagnosis not present

## 2023-01-27 DIAGNOSIS — J9601 Acute respiratory failure with hypoxia: Secondary | ICD-10-CM | POA: Diagnosis not present

## 2023-01-27 DIAGNOSIS — E8770 Fluid overload, unspecified: Secondary | ICD-10-CM | POA: Diagnosis not present

## 2023-01-27 DIAGNOSIS — I5023 Acute on chronic systolic (congestive) heart failure: Secondary | ICD-10-CM | POA: Diagnosis not present

## 2023-01-27 DIAGNOSIS — Z711 Person with feared health complaint in whom no diagnosis is made: Secondary | ICD-10-CM

## 2023-01-27 DIAGNOSIS — Z789 Other specified health status: Secondary | ICD-10-CM

## 2023-01-27 DIAGNOSIS — I4892 Unspecified atrial flutter: Secondary | ICD-10-CM | POA: Diagnosis not present

## 2023-01-27 DIAGNOSIS — Z7189 Other specified counseling: Secondary | ICD-10-CM

## 2023-01-27 LAB — GLUCOSE, CAPILLARY
Glucose-Capillary: 109 mg/dL — ABNORMAL HIGH (ref 70–99)
Glucose-Capillary: 116 mg/dL — ABNORMAL HIGH (ref 70–99)
Glucose-Capillary: 121 mg/dL — ABNORMAL HIGH (ref 70–99)
Glucose-Capillary: 135 mg/dL — ABNORMAL HIGH (ref 70–99)
Glucose-Capillary: 138 mg/dL — ABNORMAL HIGH (ref 70–99)
Glucose-Capillary: 138 mg/dL — ABNORMAL HIGH (ref 70–99)
Glucose-Capillary: 140 mg/dL — ABNORMAL HIGH (ref 70–99)

## 2023-01-27 LAB — RENAL FUNCTION PANEL
Albumin: 4.3 g/dL (ref 3.5–5.0)
Albumin: 4.1 g/dL (ref 3.5–5.0)
Anion gap: 19 — ABNORMAL HIGH (ref 5–15)
Anion gap: 16 — ABNORMAL HIGH (ref 5–15)
BUN: 40 mg/dL — ABNORMAL HIGH (ref 6–20)
BUN: 42 mg/dL — ABNORMAL HIGH (ref 6–20)
CO2: 20 mmol/L — ABNORMAL LOW (ref 22–32)
CO2: 18 mmol/L — ABNORMAL LOW (ref 22–32)
Calcium: 11 mg/dL — ABNORMAL HIGH (ref 8.9–10.3)
Calcium: 10.4 mg/dL — ABNORMAL HIGH (ref 8.9–10.3)
Chloride: 92 mmol/L — ABNORMAL LOW (ref 98–111)
Chloride: 96 mmol/L — ABNORMAL LOW (ref 98–111)
Creatinine, Ser: 3.19 mg/dL — ABNORMAL HIGH (ref 0.61–1.24)
Creatinine, Ser: 3.33 mg/dL — ABNORMAL HIGH (ref 0.61–1.24)
GFR, Estimated: 25 mL/min — ABNORMAL LOW (ref >60)
GFR, Estimated: 23 mL/min — ABNORMAL LOW (ref >60)
Glucose, Bld: 139 mg/dL — ABNORMAL HIGH (ref 70–99)
Glucose, Bld: 139 mg/dL — ABNORMAL HIGH (ref 70–99)
Phosphorus: 4.5 mg/dL (ref 2.5–4.6)
Phosphorus: 4.5 mg/dL (ref 2.5–4.6)
Potassium: 5 mmol/L (ref 3.5–5.1)
Potassium: 5.4 mmol/L — ABNORMAL HIGH (ref 3.5–5.1)
Sodium: 131 mmol/L — ABNORMAL LOW (ref 135–145)
Sodium: 130 mmol/L — ABNORMAL LOW (ref 135–145)

## 2023-01-27 LAB — CBC
HCT: 41.6 % (ref 39.0–52.0)
Hemoglobin: 13.5 g/dL (ref 13.0–17.0)
MCH: 31.8 pg (ref 26.0–34.0)
MCHC: 32.5 g/dL (ref 30.0–36.0)
MCV: 98.1 fL (ref 80.0–100.0)
Platelets: 368 10*3/uL (ref 150–400)
RBC: 4.24 MIL/uL (ref 4.22–5.81)
RDW: 16 % — ABNORMAL HIGH (ref 11.5–15.5)
WBC: 14.1 10*3/uL — ABNORMAL HIGH (ref 4.0–10.5)
nRBC: 1.2 % — ABNORMAL HIGH (ref 0.0–0.2)

## 2023-01-27 LAB — COOXEMETRY PANEL
Carboxyhemoglobin: 1.7 % — ABNORMAL HIGH (ref 0.5–1.5)
Methemoglobin: <0.7 % (ref 0.0–1.5)
O2 Saturation: 60.8 %
Total hemoglobin: 13.8 g/dL (ref 12.0–16.0)

## 2023-01-27 LAB — MAGNESIUM: Magnesium: 3.2 mg/dL — ABNORMAL HIGH (ref 1.7–2.4)

## 2023-01-27 LAB — TRIGLYCERIDES: Triglycerides: 354 mg/dL — ABNORMAL HIGH (ref <150)

## 2023-01-27 MED ORDER — PRISMASOL B22GK 4/0 22-4 MEQ/L REPLACEMENT SOLN
Status: DC
  Filled 2023-01-27 (×9): qty 5000

## 2023-01-27 MED ORDER — MIDAZOLAM-SODIUM CHLORIDE 100-0.9 MG/100ML-% IV SOLN
0.0000 mg/h | INTRAVENOUS | Status: DC
  Administered 2023-01-27: 2 mg/h via INTRAVENOUS
  Administered 2023-01-28: 5 mg/h via INTRAVENOUS
  Administered 2023-01-28: 10 mg/h via INTRAVENOUS
  Administered 2023-01-29: 4 mg/h via INTRAVENOUS
  Administered 2023-01-30 – 2023-02-01 (×6): 10 mg/h via INTRAVENOUS
  Administered 2023-02-02: 6 mg/h via INTRAVENOUS
  Administered 2023-02-03: 1 mg/h via INTRAVENOUS
  Administered 2023-02-05: 2 mg/h via INTRAVENOUS
  Administered 2023-02-06: 5 mg/h via INTRAVENOUS
  Administered 2023-02-07: 4 mg/h via INTRAVENOUS
  Filled 2023-01-27 (×15): qty 100

## 2023-01-27 MED ORDER — FENTANYL CITRATE PF 50 MCG/ML IJ SOSY: 50.0000 ug | PREFILLED_SYRINGE | Freq: Once | INTRAMUSCULAR | Status: DC

## 2023-01-27 MED ORDER — POLYETHYLENE GLYCOL 3350 17 G PO PACK: 17.0000 g | PACK | Freq: Every day | ORAL | Status: DC

## 2023-01-27 MED ORDER — MIDAZOLAM BOLUS VIA INFUSION
0.0000 mg | INTRAVENOUS | Status: DC | PRN
  Administered 2023-01-27: 1 mg via INTRAVENOUS
  Administered 2023-01-28 – 2023-02-01 (×3): 2 mg via INTRAVENOUS

## 2023-01-27 MED ORDER — DOCUSATE SODIUM 50 MG/5ML PO LIQD
100.0000 mg | Freq: Two times a day (BID) | ORAL | Status: DC
  Administered 2023-01-27 – 2023-02-01 (×11): 100 mg
  Filled 2023-01-27 (×11): qty 10

## 2023-01-27 MED ORDER — FENTANYL BOLUS VIA INFUSION
50.0000 ug | INTRAVENOUS | Status: DC | PRN
  Administered 2023-01-28: 100 ug via INTRAVENOUS
  Administered 2023-01-28: 50 ug via INTRAVENOUS
  Administered 2023-02-01: 100 ug via INTRAVENOUS
  Administered 2023-02-01: 50 ug via INTRAVENOUS
  Administered 2023-02-06: 100 ug via INTRAVENOUS
  Administered 2023-02-06: 50 ug via INTRAVENOUS

## 2023-01-27 MED ORDER — FENTANYL 2500MCG IN NS 250ML (10MCG/ML) PREMIX INFUSION
50.0000 ug/h | INTRAVENOUS | Status: DC
  Administered 2023-01-27: 200 ug/h via INTRAVENOUS
  Administered 2023-01-28: 175 ug/h via INTRAVENOUS
  Administered 2023-01-28 – 2023-02-01 (×7): 200 ug/h via INTRAVENOUS
  Administered 2023-02-01: 125 ug/h via INTRAVENOUS
  Administered 2023-02-02: 75 ug/h via INTRAVENOUS
  Administered 2023-02-04 – 2023-02-05 (×2): 50 ug/h via INTRAVENOUS
  Administered 2023-02-06 – 2023-02-07 (×2): 100 ug/h via INTRAVENOUS
  Filled 2023-01-27 (×15): qty 250

## 2023-01-27 MED ORDER — PRISMASOL B22GK 4/0 22-4 MEQ/L REPLACEMENT SOLN
Status: DC
  Filled 2023-01-27 (×8): qty 5000

## 2023-01-27 MED ORDER — BISACODYL 10 MG RE SUPP: 10.0000 mg | Freq: Every day | RECTAL | Status: DC | PRN

## 2023-01-27 MED ORDER — HYDROXYZINE HCL 10 MG PO TABS
10.0000 mg | ORAL_TABLET | Freq: Three times a day (TID) | ORAL | Status: DC
  Administered 2023-01-27 – 2023-02-25 (×83): 10 mg
  Filled 2023-01-27 (×88): qty 1

## 2023-01-27 MED ORDER — VASOPRESSIN 20 UNITS/100 ML INFUSION FOR SHOCK
0.0000 [IU]/min | INTRAVENOUS | Status: DC
  Administered 2023-01-27 (×2): 0.03 [IU]/min via INTRAVENOUS
  Administered 2023-01-28 – 2023-02-27 (×88): 0.04 [IU]/min via INTRAVENOUS
  Filled 2023-01-27 (×17): qty 100
  Filled 2023-01-27: qty 200
  Filled 2023-01-27 (×25): qty 100
  Filled 2023-01-27: qty 200
  Filled 2023-01-27 (×38): qty 100

## 2023-01-27 MED ORDER — POLYETHYLENE GLYCOL 3350 17 G PO PACK
17.0000 g | PACK | Freq: Two times a day (BID) | ORAL | Status: DC
  Administered 2023-01-27 – 2023-01-31 (×9): 17 g
  Filled 2023-01-27 (×9): qty 1

## 2023-01-27 NOTE — H&P (View-Only) (Signed)
Patient ID: Phillip Young, male   DOB: 07/22/1986, 36 y.o.   MRN: [id number]  Patient failed DCCV attempt x 3 with sternal pressure.  He converted to NSR for about 5-6 beats each time with reversion to rapid atrial flutter.   Unfortunately, we have limited options here.  Unable to use amiodarone given history of severe lung toxicity.  No other anti-arrhythmic option.  Unable to use beta blocker with hypotension.  NE dose increasing with atrial flutter.  Only option I can see is to back off on CVVH, see if we can get NE dose back down, and re-attempt DCCV.  He has end stage HF on dialysis and has not been compliant with dialysis. If we cannot control his heart rate, we are out of options to stabilize him. I think goals of care conversations with family would be appropriate.  I spoke with his mother who is realistic about the situation, and we will enlist palliative care.  I think it would be reasonable to attempt DCCV one more time later today or tomorrow, and if we cannot restore him to NSR, will need to consider comfort/palliative care.  Will consult palliative care.   30 min critical care time.   Lenix Kidd 01/27/2023 11:06 AM    

## 2023-01-27 NOTE — H&P (View-Only) (Signed)
Patient ID: Phillip Young, male   DOB: 1986-05-06, 37 y.o.   MRN: [id number]     Advanced Heart Failure Rounding Note  PCP-Cardiologist: Marca Ancona, MD   Subjective:    Remains intubated, awake on vent.  BCx + for Strep pneumoniae. Abx narrowed to ceftriaxone. Afebrile. WBC 26>>20>>11>>14K   On CVVHD, currently pulling 150 cc/hr. Weight down 8 lbs.  CVP 8-9 today.   On NE 6 + midodrine 15 mg tid with stable MAP.    Maintaining NSR.   Objective:   Weight Range: (!) 175 kg Body mass index is 52.32 kg/m.   Vital Signs:   Temp:  [97.3 F (36.3 C)-98.3 F (36.8 C)] 98.3 F (36.8 C) (07/04 0715) Pulse Rate:  [64-106] 91 (07/04 0715) Resp:  [19-32] 24 (07/04 0715) BP: (88-126)/(41-75) 94/67 (07/04 0700) SpO2:  [97 %-100 %] 99 % (07/04 0715) Arterial Line BP: (84-124)/(47-70) 108/61 (07/04 0715) FiO2 (%):  [30 %] 30 % (07/04 0430) Weight:  [175 kg] 175 kg (07/04 0600) Last BM Date : 01/21/23  Weight change: Filed Weights   01/25/23 0333 01/26/23 0500 01/27/23 0600  Weight: (!) 180.2 kg (!) 178.4 kg (!) 175 kg    Intake/Output:   Intake/Output Summary (Last 24 hours) at 01/27/2023 0815 Last data filed at 01/27/2023 0800 Gross per 24 hour  Intake 3672.31 ml  Output 6911 ml  Net -3238.69 ml      Physical Exam    CVP 8-9  General: NAD/intubated Neck: Thick, JVP difficult, no thyromegaly or thyroid nodule.  Lungs: Clear to auscultation bilaterally with normal respiratory effort. CV: Nondisplaced PMI.  Heart regular S1/S2, no S3/S4, no murmur. Trace ankle edema.   Abdomen: Soft, nontender, no hepatosplenomegaly, no distention.  Skin: Intact without lesions or rashes.  Neurologic: Awake on vent, follows commands.   Psych: Normal affect. Extremities: No clubbing or cyanosis.  HEENT: Normal.   Telemetry   NSR 90s (personally reviewed)  EKG    N/A   Labs    CBC Recent Labs    01/26/23 0351 01/27/23 0318  WBC 10.2 14.1*  HGB 10.7* 13.5  HCT 32.9* 41.6   MCV 98.2 98.1  PLT 269 368   Basic Metabolic Panel Recent Labs    40/98/11 0351 01/26/23 0352 01/26/23 1544 01/27/23 0318  NA  --    < > 132* 131*  K  --    < > 4.4 5.0  CL  --    < > 97* 92*  CO2  --    < > 19* 20*  GLUCOSE  --    < > 133* 139*  BUN  --    < > 31* 40*  CREATININE  --    < > 3.11* 3.19*  CALCIUM  --    < > 10.3 11.0*  MG 2.7*  --   --  3.2*  PHOS  --    < > 3.6 4.5   < > = values in this interval not displayed.   Liver Function Tests Recent Labs    01/26/23 1544 01/27/23 0318  ALBUMIN 3.6 4.3   No results for input(s): "LIPASE", "AMYLASE" in the last 72 hours. Cardiac Enzymes No results for input(s): "CKTOTAL", "CKMB", "CKMBINDEX", "TROPONINI" in the last 72 hours.  BNP: BNP (last 3 results) Recent Labs    05/22/22 0732 08/05/22 1720 01/21/23 1739  BNP 373.1* 15.5 1,528.6*    ProBNP (last 3 results) No results for input(s): "PROBNP" in the last 8760 hours.  D-Dimer No results for input(s): "DDIMER" in the last 72 hours. Hemoglobin A1C No results for input(s): "HGBA1C" in the last 72 hours. Fasting Lipid Panel Recent Labs    01/27/23 0318  TRIG 354*   Thyroid Function Tests No results for input(s): "TSH", "T4TOTAL", "T3FREE", "THYROIDAB" in the last 72 hours.  Invalid input(s): "FREET3"  Other results:   Imaging    No results found.   Medications:     Scheduled Medications:  apixaban  5 mg Per Tube BID   Chlorhexidine Gluconate Cloth  6 each Topical Q0600   darbepoetin (ARANESP) injection - DIALYSIS  100 mcg Subcutaneous Q Mon-1800   feeding supplement (PROSource TF20)  60 mL Per Tube QID   lidocaine  1 patch Transdermal Q24H   melatonin  5 mg Per Tube QHS   midodrine  15 mg Per Tube Q8H   multivitamin  1 tablet Per Tube QHS   mouth rinse  15 mL Mouth Rinse Q2H   pantoprazole (PROTONIX) IV  40 mg Intravenous Q24H    Infusions:   prismasol BGK 4/2.5 400 mL/hr at 01/27/23 0429    prismasol BGK 4/2.5 400 mL/hr at  01/27/23 0428   sodium chloride Stopped (01/22/23 1859)   cefTRIAXone (ROCEPHIN)  IV Stopped (01/26/23 1039)   feeding supplement (VITAL 1.5 CAL) 55 mL/hr at 01/27/23 0800   fentaNYL infusion INTRAVENOUS 75 mcg/hr (01/27/23 0800)   norepinephrine (LEVOPHED) Adult infusion 6 mcg/min (01/27/23 0800)   prismasol BGK 4/2.5 1,500 mL/hr at 01/27/23 0429   propofol (DIPRIVAN) infusion 20 mcg/kg/min (01/27/23 0800)    PRN Medications: sodium chloride, acetaminophen **OR** acetaminophen, fentaNYL, haloperidol lactate, ondansetron (ZOFRAN) IV, mouth rinse, polyethylene glycol, polyvinyl alcohol    Patient Profile   37 y.o. 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 Started on CRRT. BCx + for strep pneumo   Assessment/Plan   1.  Acute on chronic systolic CHF: Nonischemic CMP, LHC 2021 with no CAD. Previous hospitalizations w/shock in setting of rapid AFib/Flutter requiring milrinone. Most recent echo 5/24 was unchanged with EF 20-25%, RV difficult to visualize.  He signs off early from HD and is significantly volume overloaded this admission. Suspect combination cardiogenic and septic shock at this time, currently on NE 6 and midodrine 15 tid. CVP 8-9, weight down significantly with aggressive UF via CVVH.  - Continue CVVH, with CVP lower can decrease rate to 50-100 cc/hr net UF and suspect can transition back to iHD soon.   - Hopefully able to extubate today.  - Continue NE as needed for MAP, hopefully can wean down with slow UF rate on CVVH.   - Continue Midodrine 15 mg tid  - Not candidate for ICD given obesity and dialysis  - Not candidate for advanced  therapies.  2. Atrial flutter/fibrillation: History of multiple DCCVs. Amiodarone previously stopped due to elevated LFTs. Restarted 05/20/22 by Dr. Elberta Fortis.  Concern for possible amiodarone pulmonary toxicity w/ elevated ESR and CRP 11/23. He has been off amiodarone and was treated with steroids.  AF ablation at [facility] in 12/23. He was readmitted with AF/RVR but is now back in NSR.  - Not candidate for amiodarone (lung and hepatic toxicity) or Tikosyn (  ESRD), no anti-arrhythmic option.  - Continue Eliquis 5 mg bid  3. Possible amiodarone toxicity: Seen by pulmonary during admit 11/23. Had elevated ESR and sed rate. Treated with prednisone.   4. ESRD: HD per nephrology, on T,Th,Sat schedule.  Poor compliance with chronic treatment (signs off early). Significantly volume overloaded at admission. Volume status much improved.  - As above, decrease CVVH rate to 50-100 cc/hr net negative.  - Should be able to transition back to iHD soon.  - Encourage compliance with outpatient dialysis regimen.  5. ID/Bacteremia: Fever at admission, now afebrile.  Suspect PNA. BCx + for Strep pneumoniae.  - abx narrowed to ceftriaxone per PCCM  6. OHS/OSA: Has been on CPAP and home oxygen.  7. Lower extremity wounds: - Per primary  - Wound consulted 8. Acute on chronic hypoxemic respiratory failure: Baseline OHS/OSA, now suspect PNA and CHF/volume overload. Intubated.  - Antibiotics as above.  - CVVH.  - Suspect he can be extubated soon.   Length of Stay: 6  CRITICAL CARE Performed by: Marca Ancona  Total critical care time: 35 minutes  Critical care time was exclusive of separately billable procedures and treating other patients.  Critical care was necessary to treat or prevent imminent or life-threatening deterioration.  Critical care was time spent personally by me on the following activities: development of treatment plan with patient and/or surrogate as well as nursing, discussions with  consultants, evaluation of patient's response to treatment, examination of patient, obtaining history from patient or surrogate, ordering and performing treatments and interventions, ordering and review of laboratory studies, ordering and review of radiographic studies, pulse oximetry and re-evaluation of patient's condition.  Marca Ancona 01/27/2023 8:15 AM  Patient went into atrial flutter/RVR with attempted vent weaning.  HR in 150s, BP dropping and norepinephrine dose steadily increasing.  As far as I can tell, he has been on apixaban consistently and has been receiving it here.  We will proceed with urgent cardioversion given hemodynamic deterioration.   Mother was informed and consents to procedure.   Marca Ancona 01/27/2023 10:53 AM

## 2023-01-27 NOTE — Progress Notes (Signed)
RT attempted SBT with pt this am. Upon placing pt on Ps/CPAP 12/5 30% pt with immediate tachypnea to 40. Pt returned to full support at this time. Pt HR increased to as high as 173 and desaturation to 79%. Pt suctioned for minimal secretions from ETT and copious amount of thin clear from back of mouth. SpO2 improved, HR remains in 140's. RN at bedside and aware of events. RT will continue to monitor and be available as needed.

## 2023-01-27 NOTE — Progress Notes (Addendum)
Patient ID: Phillip Young, male   DOB: Apr 09, 1986, 37 y.o.   MRN: [id number]     Advanced Heart Failure Rounding Note  PCP-Cardiologist: Marca Ancona, MD   Subjective:    Remains intubated, awake on vent.  BCx + for Strep pneumoniae. Abx narrowed to ceftriaxone. Afebrile. WBC 26>>20>>11>>14K   On CVVHD, currently pulling 150 cc/hr. Weight down 8 lbs.  CVP 8-9 today.   On NE 6 + midodrine 15 mg tid with stable MAP.    Maintaining NSR.   Objective:   Weight Range: (!) 175 kg Body mass index is 52.32 kg/m.   Vital Signs:   Temp:  [97.3 F (36.3 C)-98.3 F (36.8 C)] 98.3 F (36.8 C) (07/04 0715) Pulse Rate:  [64-106] 91 (07/04 0715) Resp:  [19-32] 24 (07/04 0715) BP: (88-126)/(41-75) 94/67 (07/04 0700) SpO2:  [97 %-100 %] 99 % (07/04 0715) Arterial Line BP: (84-124)/(47-70) 108/61 (07/04 0715) FiO2 (%):  [30 %] 30 % (07/04 0430) Weight:  [175 kg] 175 kg (07/04 0600) Last BM Date : 01/21/23  Weight change: Filed Weights   01/25/23 0333 01/26/23 0500 01/27/23 0600  Weight: (!) 180.2 kg (!) 178.4 kg (!) 175 kg    Intake/Output:   Intake/Output Summary (Last 24 hours) at 01/27/2023 0815 Last data filed at 01/27/2023 0800 Gross per 24 hour  Intake 3672.31 ml  Output 6911 ml  Net -3238.69 ml      Physical Exam    CVP 8-9  General: NAD/intubated Neck: Thick, JVP difficult, no thyromegaly or thyroid nodule.  Lungs: Clear to auscultation bilaterally with normal respiratory effort. CV: Nondisplaced PMI.  Heart regular S1/S2, no S3/S4, no murmur. Trace ankle edema.   Abdomen: Soft, nontender, no hepatosplenomegaly, no distention.  Skin: Intact without lesions or rashes.  Neurologic: Awake on vent, follows commands.   Psych: Normal affect. Extremities: No clubbing or cyanosis.  HEENT: Normal.   Telemetry   NSR 90s (personally reviewed)  EKG    N/A   Labs    CBC Recent Labs    01/26/23 0351 01/27/23 0318  WBC 10.2 14.1*  HGB 10.7* 13.5  HCT 32.9* 41.6   MCV 98.2 98.1  PLT 269 368   Basic Metabolic Panel Recent Labs    40/98/11 0351 01/26/23 0352 01/26/23 1544 01/27/23 0318  NA  --    < > 132* 131*  K  --    < > 4.4 5.0  CL  --    < > 97* 92*  CO2  --    < > 19* 20*  GLUCOSE  --    < > 133* 139*  BUN  --    < > 31* 40*  CREATININE  --    < > 3.11* 3.19*  CALCIUM  --    < > 10.3 11.0*  MG 2.7*  --   --  3.2*  PHOS  --    < > 3.6 4.5   < > = values in this interval not displayed.   Liver Function Tests Recent Labs    01/26/23 1544 01/27/23 0318  ALBUMIN 3.6 4.3   No results for input(s): "LIPASE", "AMYLASE" in the last 72 hours. Cardiac Enzymes No results for input(s): "CKTOTAL", "CKMB", "CKMBINDEX", "TROPONINI" in the last 72 hours.  BNP: BNP (last 3 results) Recent Labs    05/22/22 0732 08/05/22 1720 01/21/23 1739  BNP 373.1* 15.5 1,528.6*    ProBNP (last 3 results) No results for input(s): "PROBNP" in the last 8760 hours.  D-Dimer No results for input(s): "DDIMER" in the last 72 hours. Hemoglobin A1C No results for input(s): "HGBA1C" in the last 72 hours. Fasting Lipid Panel Recent Labs    01/27/23 0318  TRIG 354*   Thyroid Function Tests No results for input(s): "TSH", "T4TOTAL", "T3FREE", "THYROIDAB" in the last 72 hours.  Invalid input(s): "FREET3"  Other results:   Imaging    No results found.   Medications:     Scheduled Medications:  apixaban  5 mg Per Tube BID   Chlorhexidine Gluconate Cloth  6 each Topical Q0600   darbepoetin (ARANESP) injection - DIALYSIS  100 mcg Subcutaneous Q Mon-1800   feeding supplement (PROSource TF20)  60 mL Per Tube QID   lidocaine  1 patch Transdermal Q24H   melatonin  5 mg Per Tube QHS   midodrine  15 mg Per Tube Q8H   multivitamin  1 tablet Per Tube QHS   mouth rinse  15 mL Mouth Rinse Q2H   pantoprazole (PROTONIX) IV  40 mg Intravenous Q24H    Infusions:   prismasol BGK 4/2.5 400 mL/hr at 01/27/23 0429    prismasol BGK 4/2.5 400 mL/hr at  01/27/23 0428   sodium chloride Stopped (01/22/23 1859)   cefTRIAXone (ROCEPHIN)  IV Stopped (01/26/23 1039)   feeding supplement (VITAL 1.5 CAL) 55 mL/hr at 01/27/23 0800   fentaNYL infusion INTRAVENOUS 75 mcg/hr (01/27/23 0800)   norepinephrine (LEVOPHED) Adult infusion 6 mcg/min (01/27/23 0800)   prismasol BGK 4/2.5 1,500 mL/hr at 01/27/23 0429   propofol (DIPRIVAN) infusion 20 mcg/kg/min (01/27/23 0800)    PRN Medications: sodium chloride, acetaminophen **OR** acetaminophen, fentaNYL, haloperidol lactate, ondansetron (ZOFRAN) IV, mouth rinse, polyethylene glycol, polyvinyl alcohol    Patient Profile   37 y.o. 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 Started on CRRT. BCx + for strep pneumo   Assessment/Plan   1.  Acute on chronic systolic CHF: Nonischemic CMP, LHC 2021 with no CAD. Previous hospitalizations w/shock in setting of rapid AFib/Flutter requiring milrinone. Most recent echo 5/24 was unchanged with EF 20-25%, RV difficult to visualize.  He signs off early from HD and is significantly volume overloaded this admission. Suspect combination cardiogenic and septic shock at this time, currently on NE 6 and midodrine 15 tid. CVP 8-9, weight down significantly with aggressive UF via CVVH.  - Continue CVVH, with CVP lower can decrease rate to 50-100 cc/hr net UF and suspect can transition back to iHD soon.   - Hopefully able to extubate today.  - Continue NE as needed for MAP, hopefully can wean down with slow UF rate on CVVH.   - Continue Midodrine 15 mg tid  - Not candidate for ICD given obesity and dialysis  - Not candidate for advanced  therapies.  2. Atrial flutter/fibrillation: History of multiple DCCVs. Amiodarone previously stopped due to elevated LFTs. Restarted 05/20/22 by Dr. Elberta Fortis.  Concern for possible amiodarone pulmonary toxicity w/ elevated ESR and CRP 11/23. He has been off amiodarone and was treated with steroids.  AF ablation at [facility] in 12/23. He was readmitted with AF/RVR but is now back in NSR.  - Not candidate for amiodarone (lung and hepatic toxicity) or Tikosyn (  ESRD), no anti-arrhythmic option.  - Continue Eliquis 5 mg bid  3. Possible amiodarone toxicity: Seen by pulmonary during admit 11/23. Had elevated ESR and sed rate. Treated with prednisone.   4. ESRD: HD per nephrology, on T,Th,Sat schedule.  Poor compliance with chronic treatment (signs off early). Significantly volume overloaded at admission. Volume status much improved.  - As above, decrease CVVH rate to 50-100 cc/hr net negative.  - Should be able to transition back to iHD soon.  - Encourage compliance with outpatient dialysis regimen.  5. ID/Bacteremia: Fever at admission, now afebrile.  Suspect PNA. BCx + for Strep pneumoniae.  - abx narrowed to ceftriaxone per PCCM  6. OHS/OSA: Has been on CPAP and home oxygen.  7. Lower extremity wounds: - Per primary  - Wound consulted 8. Acute on chronic hypoxemic respiratory failure: Baseline OHS/OSA, now suspect PNA and CHF/volume overload. Intubated.  - Antibiotics as above.  - CVVH.  - Suspect he can be extubated soon.   Length of Stay: 6  CRITICAL CARE Performed by: Marca Ancona  Total critical care time: 35 minutes  Critical care time was exclusive of separately billable procedures and treating other patients.  Critical care was necessary to treat or prevent imminent or life-threatening deterioration.  Critical care was time spent personally by me on the following activities: development of treatment plan with patient and/or surrogate as well as nursing, discussions with  consultants, evaluation of patient's response to treatment, examination of patient, obtaining history from patient or surrogate, ordering and performing treatments and interventions, ordering and review of laboratory studies, ordering and review of radiographic studies, pulse oximetry and re-evaluation of patient's condition.  Marca Ancona 01/27/2023 8:15 AM  Patient went into atrial flutter/RVR with attempted vent weaning.  HR in 150s, BP dropping and norepinephrine dose steadily increasing.  As far as I can tell, he has been on apixaban consistently and has been receiving it here => will not do TEE.  We will proceed with urgent cardioversion given hemodynamic deterioration.   Mother was informed and consents to procedure.   Marca Ancona 01/27/2023 10:53 AM

## 2023-01-27 NOTE — Procedures (Signed)
Electrical Cardioversion Procedure Note Keshun Waszak [id number] 1986-06-30  Procedure: Electrical Cardioversion Indications:  Atrial Flutter  Procedure Details Consent: Risks of procedure as well as the alternatives and risks of each were explained to the (patient/caregiver).  Consent for procedure obtained. Time Out: Verified patient identification, verified procedure, site/side was marked, verified correct patient position, special equipment/implants available, medications/allergies/relevent history reviewed, required imaging and test results available.  Performed  Patient placed on cardiac monitor, pulse oximetry, supplemental oxygen as necessary.  Sedation given:  Propofol per CCM Pacer pads placed anterior and posterior chest.  Cardioverted 3 times with sternal pressure.  Cardioverted at 200J.  Evaluation Findings: Post procedure EKG shows: Atrial Flutter Complications: None Patient did tolerate procedure well.   Marca Ancona 01/27/2023, 11:01 AM

## 2023-01-27 NOTE — Progress Notes (Signed)
[facility] NEPHROLOGY PROGRESS NOTE  Assessment/ Plan: Pt is a 37 y.o. yo male chronic CHF, PAF, morbid obesity, OSA, ESRD on HD with pneumonia and CHF exacerbation.  OP HD orders: [facility], TTS. 4hrs 15 min. F180. Flow rates: 425/autoflow 1.5. 3k 2.5cal. EDW 180kg. Access: TDC. Meds: Mircers 200 mcg every two weeks (last dose 6/13), sensipar 60mg  three times per week, hectorol three times per week . Historically noncompliant with HD I.e. doesn't stay for full prescribed treatment times, high IDWG/does not adhere to fluid restriction   # ESRD: Required emergent HD on 6/29 because of fluid overload.  He is on Levophed for hypotension/shock.  Started CRRT on 6/29 with UF goal 100-150 cc an hour as tolerated by BP.   The CVP has improved to around 7-8 today.  He developed A-fib with RVR therefore UF is on hold this morning.  Plan for cardioversion noted.  We will plan to continue CRRT with lower UF goal of around 50 cc an hour.  May be able to wean off CRRT tomorrow and assess intermittent HD in next 1 to 2 days.  All 4K bath.  Changed to low calcium bath because of hypercalcemia.  Monitor lab.  Discussed with ICU nurse.  # Acute on chronic systolic CHF, nonischemic: EF 16-10%.  Optimize volume with CRRT.  Discussed with the cardiology team.  Not a candidate for ICD and advanced therapies for CHF because of obesity and dialysis.  # Sepsis/septic shock: Currently on broad-spectrum antibiotics, per primary team.  Levophed as a pressor.  Adding midodrine.  # Anemia of CKD: Hemoglobin at goal.  Received Aranesp on 7/1.  # Hypercalcemia: Changed to 0 calcium and both pre and post filter.  Continue 2.5 calcium in dialysate.  Monitor lab.  # A-fib with RVR with hypotension: Plan for urgent cardioversion this morning by cardiologist.  Subjective: Seen and examined in ICU.  Event noted.  Patient had A-fib with RVR.  BP is soft and agitated.  Required higher sedation and pressure.  CVP is  improving therefore UF goal lowered, actually running even this morning. . Objective Vital signs in last 24 hours: Vitals:   01/27/23 0845 01/27/23 0900 01/27/23 0915 01/27/23 1000  BP:  (!) 93/59  96/66  Pulse: (!) 137 (!) 137 (!) 139 (!) 144  Resp: (!) 26 (!) 24 (!) 24 (!) 25  Temp:      TempSrc:      SpO2: 97% 98% 97% 99%  Weight:      Height:       Weight change: -3.4 kg  Intake/Output Summary (Last 24 hours) at 01/27/2023 1054 Last data filed at 01/27/2023 1000 Gross per 24 hour  Intake 3557.15 ml  Output 6684.4 ml  Net -3127.25 ml        Labs: RENAL PANEL Recent Labs  Lab 01/23/23 0405 01/23/23 1511 01/24/23 0419 01/24/23 1520 01/25/23 0335 01/25/23 1611 01/26/23 0351 01/26/23 0352 01/26/23 1544 01/27/23 0318  NA 132*   < > 133*   < > 133* 131*  --  131* 132* 131*  K 4.9   < > 4.2   < > 3.5 3.1*  --  3.7 4.4 5.0  CL 95*   < > 92*   < > 95* 97*  --  95* 97* 92*  CO2 21*   < > 22   < > 23 20*  --  23 19* 20*  GLUCOSE 113*   < > 143*   < >  117* 122*  --  120* 133* 139*  BUN 44*   < > 36*   < > 29* 28*  --  31* 31* 40*  CREATININE 7.38*   < > 4.76*   < > 3.60* 3.32*  --  3.29* 3.11* 3.19*  CALCIUM 9.0   < > 9.3   < > 9.1 8.8*  --  9.8 10.3 11.0*  MG 2.2  --  2.6*  --  2.8*  --  2.7*  --   --  3.2*  PHOS 5.7*   < > 5.4*   < > 3.7 3.5  --  3.6 3.6 4.5  ALBUMIN 3.3*   < > 3.2*   < > 3.2* 3.1*  --  3.4* 3.6 4.3   < > = values in this interval not displayed.     Liver Function Tests: Recent Labs  Lab 01/21/23 1739 01/21/23 1842 01/26/23 0352 01/26/23 1544 01/27/23 0318  AST 18  --   --   --   --   ALT 11  --   --   --   --   ALKPHOS 97  --   --   --   --   BILITOT 2.3*  --   --   --   --   PROT 7.6  --   --   --   --   ALBUMIN 3.4*   < > 3.4* 3.6 4.3   < > = values in this interval not displayed.    No results for input(s): "LIPASE", "AMYLASE" in the last 168 hours. No results for input(s): "AMMONIA" in the last 168 hours. CBC: Recent Labs     03/26/22 1958 03/27/22 0300 06/04/22 1924 06/05/22 0154 08/09/22 0042 08/10/22 0107 01/23/23 0405 01/24/23 0419 01/25/23 0335 01/26/23 0351 01/27/23 0318  HGB  --    < >  --    < > 6.7*   < > 10.2* 9.7* 10.0* 10.7* 13.5  MCV  --    < >  --    < > 106.1*   < > 100.6* 98.7 98.7 98.2 98.1  VITAMINB12 297  --  5,998*  --   --   --   --   --   --   --   --   FOLATE  --   --  15.7  --   --   --   --   --   --   --   --   FERRITIN 370*  --   --   --  156  --   --   --   --   --   --   TIBC 321  --   --   --  330  --   --   --   --   --   --   IRON 27*  --   --   --  28*  --   --   --   --   --   --    < > = values in this interval not displayed.     Cardiac Enzymes: No results for input(s): "CKTOTAL", "CKMB", "CKMBINDEX", "TROPONINI" in the last 168 hours. CBG: Recent Labs  Lab 01/26/23 1557 01/26/23 2028 01/27/23 0024 01/27/23 0325 01/27/23 0716  GLUCAP 128* 137* 116* 121* 135*     Iron Studies: No results for input(s): "IRON", "TIBC", "TRANSFERRIN", "FERRITIN" in the last 72 hours. Studies/Results: No results found.  Medications: Infusions:  prismasol BGK 4/2.5 400 mL/hr at 01/27/23 0429    prismasol BGK 4/2.5 400 mL/hr at 01/27/23 0428   sodium chloride Stopped (01/22/23 1859)   cefTRIAXone (ROCEPHIN)  IV 2 g (01/27/23 1009)   feeding supplement (VITAL 1.5 CAL) 55 mL/hr at 01/27/23 1000   fentaNYL infusion INTRAVENOUS 150 mcg/hr (01/27/23 1000)   norepinephrine (LEVOPHED) Adult infusion 12 mcg/min (01/27/23 1000)   prismasol BGK 4/2.5 1,500 mL/hr at 01/27/23 0429   propofol (DIPRIVAN) infusion 30 mcg/kg/min (01/27/23 1007)    Scheduled Medications:  apixaban  5 mg Per Tube BID   Chlorhexidine Gluconate Cloth  6 each Topical Q0600   darbepoetin (ARANESP) injection - DIALYSIS  100 mcg Subcutaneous Q Mon-1800   feeding supplement (PROSource TF20)  60 mL Per Tube QID   lidocaine  1 patch Transdermal Q24H   melatonin  5 mg Per Tube QHS   midodrine  15 mg Per Tube  Q8H   multivitamin  1 tablet Per Tube QHS   mouth rinse  15 mL Mouth Rinse Q2H   pantoprazole (PROTONIX) IV  40 mg Intravenous Q24H    have reviewed scheduled and prn medications.  Physical Exam: General: Ill looking male, intubated, sedated, CVP around 8. Heart: A-fib with RVR, s1s2 nl Lungs: Coarse breath sound bilateral Abdomen:soft, Non-tender, non-distended Extremities: Bilateral leg edema presents Dialysis Access: RIJ TDC in place.  Site clean.  Merline Perkin Prasad Sharetta Ricchio 01/27/2023,10:54 AM  LOS: 6 days

## 2023-01-27 NOTE — Consult Note (Signed)
Consultation Note Date: 01/27/2023   Patient Name: Phillip Young  DOB: 04/28/1986  MRN: [id number]  Age / Sex: 37 y.o., male  PCP: Marrianne Mood, MD Referring Physician: Steffanie Dunn, DO  Reason for Consultation: Establishing goals of care, "GOC- endstage multiple processes, poor prognosis"  HPI/Patient Profile: 37 y.o. male  with past medical history of AF, HFrEF (EF 20-25%), severe obesity, ESRD on HD, chronic hypoxic respiratory failure, OSA presented to ED on 01/22/23 from home with severe dyspnea - poor adherence to outpatient HD and fluid restriction. Patient was admitted on 01/21/2023 with acute on chronic hypoxic respiratory failure, hypervolemic, sepsis/septic shock, afib with RVR, chest pain, hematuria. CRRT was started 01/22/23. Cardiology was consulted: he is not a candidate for advanced therapies or ICD due to frailty, obesity, and dialysis. Cardioversion was attempted x3 on 01/27/23 without success. Options are very limited. If NSR cannot be restored, family will need to consider comfort measures. PMT was consulted to assist family with GOC.  Clinical Assessment and Goals of Care: I have reviewed medical records including EPIC notes, labs, and imaging. Patient remains on infusions of fentanyl, levophed, and propofol. He failed SBT today. Cardioversion attempted x3 but unsuccessful. Received report from primary RN - no acute concerns.   Went to visit patient at bedside - no family/visitors present; RN present providing care. Patient was lying in bed with his eyes open; otherwise not meaningfully responsive. Patient generally appears ill and uncomfortable. No respiratory distress, increased work of breathing, or secretions noted. Patient is intubated with tube feeds running. Mitts in place. Tachycardic on bedside monitor.  Met with patient's mother/Phillip via phone  to discuss diagnosis, prognosis, GOC, EOL  wishes, disposition, and options.  I introduced Palliative Medicine as specialized medical care for people living with serious illness. It focuses on providing relief from the symptoms and stress of a serious illness. The goal is to improve quality of life for both the patient and the family.  [name] did not have much time to talk - she wished to discuss GOC rather than life review/functional/nutritional status prior to admission.  We discussed patient's current illness and what it means in the larger context of patient's on-going co-morbidities. She has a clear understanding of patient's current acute medical situation. She appreciates the updates from Dr. Shirlee Latch this morning. Natural disease trajectory and expectations at EOL were discussed. I attempted to elicit values and goals of care important to the patient. The difference between aggressive medical intervention and comfort care was considered in light of the patient's goals of care.   At this time, she would like to proceed with DCCV one more time as offered. She is hopeful this can wait and be attempted tomorrow - reviewed that it would be pending patient's clinical status but would notify Dr. Shirlee Latch.   [name] is open to ongoing GOC discussions pending the outcome of one more attempted cardioversion. She tells me "I understand there are no other options left if it doesn't work."   Reviewed patient's most recent vital signs per her request.  Discussed with [name] the importance of continued conversation with each other and the medical providers regarding overall plan of care and treatment options, ensuring decisions are within the context of the patient's values and GOCs.    Questions and concerns were addressed. The patient/family was encouraged to call with questions and/or concerns. PMT card was left at bedside for [facility].   Primary Decision Maker: NEXT OF KIN - mother/Phillip Young    SUMMARY  OF RECOMMENDATIONS   Continue  full code/full scope care Proceed with one more attempt at DCCV Mother is open to ongoing GOC pending clinical course PMT will continue to follow and support holistically   Code Status/Advance Care Planning: Full code  Palliative Prophylaxis:  Aspiration, Frequent Pain Assessment, Oral Care, and Turn Reposition  Additional Recommendations (Limitations, Scope, Preferences): Full Scope Treatment  Psycho-social/Spiritual:  Desire for further Chaplaincy support:no Created space and opportunity for patient and family to express thoughts and feelings regarding patient's current medical situation.  Emotional support and therapeutic listening provided.  Prognosis:  Poor  Discharge Planning: To Be Determined      Primary Diagnoses: Present on Admission:  Acute hypoxic respiratory failure ([name])  Sepsis ([name])  Chronic systolic CHF (congestive heart failure) ([name])  Morbid obesity ([name])  Hypervolemia   I have reviewed the medical record, interviewed the patient and family, and examined the patient. The following aspects are pertinent.  Past Medical History:  Diagnosis Date   Acute on chronic respiratory failure with hypoxia ([name]) 04/21/2021   Acute on chronic systolic (congestive) heart failure ([name]) 02/26/2020   Amiodarone toxicity    Anemia    Atrial flutter ([name])    Biventricular congestive heart failure ([name])    Chronic hypoxemic respiratory failure ([name])    Class 3 severe obesity due to excess calories with serious comorbidity and body mass index (BMI) of 50.0 to 59.9 in adult [facility]) 02/26/2020   Essential hypertension 02/26/2020   GERD without esophagitis 02/26/2020   Hidradenitis suppurativa 02/26/2020   NICM (nonischemic cardiomyopathy) ([name])    Obesity hypoventilation syndrome ([name])    OSA (obstructive sleep apnea)    PAF (paroxysmal atrial fibrillation) ([name])    Pneumonia    Prediabetes 02/26/2020   Social History   Socioeconomic History   Marital status:  Single    Spouse name: Not on file   Number of children: Not on file   Years of education: Not on file   Highest education level: Not on file  Occupational History   Not on file  Tobacco Use   Smoking status: Former    Packs/day: 1    Types: Cigarettes    Quit date: 2019    Years since quitting: 5.5    Passive exposure: Current   Smokeless tobacco: Former   Tobacco comments:    quit in 2019  Vaping Use   Vaping Use: Never used  Substance and Sexual Activity   Alcohol use: Never   Drug use: Never   Sexual activity: Not on file  Other Topics Concern   Not on file  Social History Narrative   Not on file   Social Determinants of Health   Financial Resource Strain: High Risk (09/21/2022)   Overall Financial Resource Strain (CARDIA)    Difficulty of Paying Living Expenses: Hard  Food Insecurity: No Food Insecurity (01/22/2023)   Hunger Vital Sign    Worried About Running Out of Food in the Last Year: Never true    Ran Out of Food in the Last Year: Never true  Transportation Needs: No Transportation Needs (01/22/2023)   PRAPARE - Administrator, Civil Service (Medical): No    Lack of Transportation (Non-Medical): No  Physical Activity: Inactive (09/21/2022)   Exercise Vital Sign    Days of Exercise per Week: 0 days    Minutes of Exercise per Session: 0 min  Stress: No Stress Concern Present (09/21/2022)   [employer] of Occupational Health - Occupational  Stress Questionnaire    Feeling of Stress : Not at all  Social Connections: Unknown (09/21/2022)   Social Connection and Isolation Panel [NHANES]    Frequency of Communication with Friends and Family: More than three times a week    Frequency of Social Gatherings with Friends and Family: Never    Attends Religious Services: Not on Marketing executive or Organizations: Not on file    Attends Banker Meetings: Never    Marital Status: Never married   Family History  Problem Relation  Age of Onset   Heart disease Mother    Hypertension Mother    Pulmonary Hypertension Mother    Drug abuse Father        died due to Heroin overdose   Scheduled Meds:  apixaban  5 mg Per Tube BID   Chlorhexidine Gluconate Cloth  6 each Topical Q0600   darbepoetin (ARANESP) injection - DIALYSIS  100 mcg Subcutaneous Q Mon-1800   feeding supplement (PROSource TF20)  60 mL Per Tube QID   lidocaine  1 patch Transdermal Q24H   melatonin  5 mg Per Tube QHS   midodrine  15 mg Per Tube Q8H   multivitamin  1 tablet Per Tube QHS   mouth rinse  15 mL Mouth Rinse Q2H   pantoprazole (PROTONIX) IV  40 mg Intravenous Q24H   Continuous Infusions:  sodium chloride Stopped (01/22/23 1859)   cefTRIAXone (ROCEPHIN)  IV Stopped (01/27/23 1039)   feeding supplement (VITAL 1.5 CAL) 55 mL/hr at 01/27/23 1100   fentaNYL infusion INTRAVENOUS 125 mcg/hr (01/27/23 1100)   norepinephrine (LEVOPHED) Adult infusion 18 mcg/min (01/27/23 1100)   prismasol B22GK 4/0     prismasol B22GK 4/0     prismasol BGK 4/2.5 1,500 mL/hr at 01/27/23 0429   propofol (DIPRIVAN) infusion 25 mcg/kg/min (01/27/23 1100)   PRN Meds:.sodium chloride, acetaminophen **OR** acetaminophen, fentaNYL, haloperidol lactate, ondansetron (ZOFRAN) IV, mouth rinse, polyethylene glycol, polyvinyl alcohol Medications Prior to Admission:  Prior to Admission medications   Medication Sig Start Date End Date Taking? Authorizing Provider  apixaban (ELIQUIS) 5 MG TABS tablet Take 1 tablet (5 mg total) by mouth 2 (two) times daily. 08/18/22  Yes Steffanie Rainwater, MD  calamine lotion Apply 1 Application topically 2 (two) times daily. 08/13/22   Adron Bene, MD  camphor-menthol [facility]) lotion Apply topically as needed for itching. 08/13/22   Adron Bene, MD  clindamycin (CLINDAGEL) 1 % gel Apply topically 2 (two) times daily. Apply to hydradenitis wounds 08/13/22   Belva Agee, MD  fluticasone furoate-vilanterol (BREO ELLIPTA) 100-25  MCG/ACT AEPB INHALE 1 PUFF INTO THE LUNGS DAILY 10/13/22   Belva Agee, MD  gabapentin (NEURONTIN) 100 MG capsule Take by mouth. 09/17/21   [provider]  hydrOXYzine (ATARAX) 25 MG tablet Take 25 mg by mouth 2 (two) times daily. 10/13/22   [provider]  lanthanum (FOSRENOL) 1000 MG chewable tablet Chew 2,000 mg by mouth 3 (three) times daily with meals.    [provider]  magnesium oxide (MAG-OX) 400 (240 Mg) MG tablet Take 400 mg by mouth 2 (two) times daily. 08/04/22   [provider]  melatonin 5 MG TABS Take 1 tablet (5 mg total) by mouth at bedtime. 06/10/22   Crissie Sickles, MD  metolazone (ZAROXOLYN) 10 MG tablet Take 10 mg by mouth daily. 08/04/22   [provider]  midodrine (PROAMATINE) 5 MG tablet TAKE 1 TABLET(5 MG) BY MOUTH THREE TIMES  DAILY WITH MEALS 06/10/22   Katsadouros, Vasilios, MD  oxyCODONE (OXY IR/ROXICODONE) 5 MG immediate release tablet Take by mouth. 08/04/22   [provider]  sevelamer carbonate (RENVELA) 800 MG tablet Take 1,600 mg by mouth 3 (three) times daily with meals. 08/04/22   [provider]  torsemide (DEMADEX) 100 MG tablet TAKE 1 TABLET BY MOUTH 3 TIMES A WEEK ON NON-DIALYSIS DAYS. 08/18/22   Steffanie Rainwater, MD  WEGOVY 0.25 MG/0.5ML SOAJ Inject 0.25 mg into the skin once a week. 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   Constitutional:      General: He is not in acute distress.    Appearance: He is ill-appearing.     Interventions: He is intubated.  Cardiovascular:     Rate and Rhythm: Tachycardia present.  Pulmonary:     Effort: No respiratory distress. He is intubated.  Skin:    General: Skin is warm and dry.     Vital Signs: BP 110/76   Pulse (!) 148   Temp (!) 96.9 F (36.1 C) (Axillary)   Resp (!) 25   Ht 6' (1.829 m)   Wt (!) 175 kg   SpO2 94%   BMI 52.32 kg/m  Pain Scale: CPOT   Pain Score: 0-No pain   SpO2: SpO2: 94 % O2 Device:SpO2: 94 % O2 Flow Rate: .O2 Flow Rate (L/min): 13 L/min  IO: Intake/output summary:  Intake/Output Summary (Last 24 hours) at 01/27/2023 1142 Last data filed at 01/27/2023 1100 Gross per 24 hour  Intake 3554.88 ml  Output 6314.4 ml  Net -2759.52 ml    LBM: Last BM Date : 01/21/23 Baseline Weight: Weight: (!) 182.1 kg Most recent weight: Weight: (!) 175 kg     Palliative Assessment/Data: PPS 30% with tube feeds     Time In: 1130 Time Out: 1250 Time Total: 80 minutes  Greater than 50%  of this time was spent counseling and coordinating care related to the above assessment and plan.  Signed by: Haskel Khan, NP   Please contact Palliative Medicine Team phone at [phone] for questions and concerns.  For individual provider: See Amion  *Portions of this note are a verbal dictation therefore any spelling and/or grammatical errors are due to the "Dragon Medical One" system interpretation.

## 2023-01-27 NOTE — Interval H&P Note (Signed)
History and Physical Interval Note:  01/27/2023 10:54 AM  Phillip Young  has presented today for surgery, with the diagnosis of * No surgery found *.  The various methods of treatment have been discussed with the patient and family. After consideration of risks, benefits and other options for treatment, the patient has consented to  * No surgery found * as a surgical intervention.  The patient's history has been reviewed, patient examined, no change in status, stable for surgery.  I have reviewed the patient's chart and labs.  Questions were answered to the patient's satisfaction.     Maddison Kilner [employer]

## 2023-01-27 NOTE — Progress Notes (Signed)
NAME:  Phillip Young, MRN:  [id number], DOB:  07-Aug-1985, LOS: 6 ADMISSION DATE:  01/21/2023, CONSULTATION DATE:  01/22/23 REFERRING MD:  Danise Edge, CHIEF COMPLAINT:  SOB   History of Present Illness:  37 year old man well known to the PCCM and AHF services who presented to [facility] 6/28 with fevers, joint pains, SOB. PMHx significant for ESRD, NICM, refractory AFib, ?amio-induced lung toxicity. Patient reportedly missed HD on Thursday (has a history of leaving HD early). Reportedly ~10kg over dry weight with worsening breathing status, AFib/RVR and borderline BP.  PCCM consulted for ICU admission/further evaluation and management.  Pertinent Medical History:   Past Medical History:  Diagnosis Date   Acute on chronic respiratory failure with hypoxia ([name]) 04/21/2021   Acute on chronic systolic (congestive) heart failure ([name]) 02/26/2020   Amiodarone toxicity    Anemia    Atrial flutter ([name])    Biventricular congestive heart failure ([name])    Chronic hypoxemic respiratory failure ([name])    Class 3 severe obesity due to excess calories with serious comorbidity and body mass index (BMI) of 50.0 to 59.9 in adult [facility]) 02/26/2020   Essential hypertension 02/26/2020   GERD without esophagitis 02/26/2020   Hidradenitis suppurativa 02/26/2020   NICM (nonischemic cardiomyopathy) ([name])    Obesity hypoventilation syndrome ([name])    OSA (obstructive sleep apnea)    PAF (paroxysmal atrial fibrillation) ([name])    Pneumonia    Prediabetes 02/26/2020   Significant Hospital Events: Including procedures, antibiotic start and stop dates in addition to other pertinent events   6/28 Admit, emergent HD 6/29 Afib/RVR, shock, ICU transfer 6/30 Intubated and placed on vasopressor support and started on CRRT 7/1 Remains intubated on CRRT continue with pressor support 7/2 Agitated with SAT, remains on CRRT and low dose levophed 7/3 low dose NE, ongoing CRRT  Interim History / Subjective:   Attempted SBT this am,  apneic but severely agitated, desaturated, HR up 130/140s.  Re-sedated but HR remains 140's, NE up from 6 to . NSR on EKG.  Was pulling 50-100on UF, since kept even for now.  CVP 8  Objective:  Blood pressure (!) 93/59, pulse (!) 139, temperature 98.3 F (36.8 C), temperature source Axillary, resp. rate (!) 24, height 6' (1.829 m), weight (!) 175 kg, SpO2 97 %. CVP:  [4 mmHg-73 mmHg] 6 mmHg  Vent Mode: PRVC FiO2 (%):  [30 %] 30 % Set Rate:  [24 bmp] 24 bmp Vt Set:  [620 mL] 620 mL PEEP:  [5 cmH20] 5 cmH20 Plateau Pressure:  [18 cmH20-22 cmH20] 22 cmH20   Intake/Output Summary (Last 24 hours) at 01/27/2023 0937 Last data filed at 01/27/2023 0900 Gross per 24 hour  Intake 3569.24 ml  Output 7044.4 ml  Net -3475.16 ml   Filed Weights   01/25/23 0333 01/26/23 0500 01/27/23 0600  Weight: (!) 180.2 kg (!) 178.4 kg (!) 175 kg   Physical Examination: General:  AoC ill appearing adult male lying in bed sedated, NAD HEENT: MM pink/moist, ETT Neuro: sedated, no response to noxious stimuli CV: rr, HR 140's> aflutter vs ST? PULM:  MV supported, clear anteriorly GI: obese, bs+, soft,   Extremities: warm/dry, generalized edema  Skin: no rashes   UF 7L Net -20L Labs reviewed> coox 54> 60, Na 131, AG 19, Mag 3.2, WBC 14, H/H 10.7/ 32.9> 13.5/ 41 ?accurate    Resolved Hospital Problem List:  N/A  Assessment & Plan:   Acute-on-chronic hypoxemic respiratory failure, multifactorial including pneumonia and volume overload due to noncompliance  with hemodialysis History of OHS/OSA Patient presented with shortness of breath, he was noted to be volume overloaded, he missed hemodialysis session but usually does not complete hemodialysis. Noted to be volume overloaded with CXR suggestive of bilateral lower lobe pneumonia. - did not tolerate SBT this am> back into flutter/ desaturated/ agitated - cont full vent support (4-8cc/kg IBW) - wean FiO2 for O2 sat > 90% - volume removal with CRRT -  VAP bundle/ PPI  - Pulmonary hygiene - PAD protocol for sedation: Propofol and Fentanyl for goal RASS 0 to -1.  Monitor triglycerides closely.  Add back pta hydroxyzine and scheduled enteral oxy with bowel regimen - cont ceftriaxone as below - consider sending trach asp if worsening secretions, new fever, worsening leukocytosis   Septic shock due to strep pneumo bacteremia, POA BCx grew strep pneumo, likely secondary to bilateral lower lobe pneumonia. - cont NE for goal MAP > 65 and midodrine 15mg  TID, minimize as able given recurrent flutter issues  - Trend WBC, fever curve - cont ceftriaxone, (day 5/ x, day 7 total of abx) - repeat BC 7/1 pending, ngtd   Acute on chronic HFrEF Nonischemic cardiomyopathy -Echocardiogram 5/24 with unchanged EF of 20 to 25% History of paroxysmal rapid A-fib/flutter with history of possible amiodarone toxicity History of multiple DCCVs with history of amiodarone toxicity treated with steroids - AHF team following, appreciate assistance - HR either ST vs aflutter, suspected aflutter.  Increased vasopressor requirements since going into Aflutter.  Even UF on CRRT.  Plans for bedside DCCV with HF.  No current medicine options given prior amio toxicity and HF - Per AHF, not a candidate for advanced therapies at this time - Cardiac monitoring - Optimize electrolytes for K > 4, Mg > 2 - Strict I&Os, daily weights - CRRT for volume removal  - cont Eliquis per pharmacy - considering PMT consult as we have very little treatment options left.    End-stage renal disease on hemodialysis Hypervolemic hyponatremia -improving Hyperphosphatemia Hyperkalemia - improved High anion gap metabolic acidosis On HD T/Th/Sa but is often noncompliant with sessions. - Nephro following, appreciate assistance - remains on CRRT.  Currently even UF while in Aflutter.  Previous UF decreased to negative 50-100 ml/hr - trend renal indices  - strict I/Os, daily wts - avoid  nephrotoxins, renal dose meds, hemodynamic support as above  Acute septic encephalopathy - Neuroprotective measures: HOB > 30 degrees, normoglycemia, normothermia, electrolytes WNL - Seizure/aspiration precautions - Minimize sedating medications as able - Correct metabolic derangements - Defer neurologic imaging at this time  Hematuria POA No hydronephrosis, medical renal disease seen on renal ultrasound. Possible traumatic Foley insertion - Foley out. Bladder scan prn.  H/H stable - May need outpatient Uro follow-up  Morbid obesity At risk for malnutrition - Continue TF - RD/Nutritions following, appreciate assistance - Protein supplements - unclear last BM> aggressive bowel regimen added 7/4  Goals of care Mother updated at bedside 6/30 with decision to continue with aggressive interventions, given multisystem organ involvement with medication and medical noncompliance needs ongoing goals of care discussions. - no family at bedside 7/4.  Will consult PMT to assist in ongoing discussions   Best Practice (right click and "Reselect all SmartList Selections" daily)   Diet/type: ube feeds DVT prophylaxis: DOAC GI prophylaxis: Protonix Lines: Yes still needed Foley: n/a Code Status:  full code Last date of multidisciplinary goals of care: 6/30  Pending update 7/4  Critical care time:   The patient is critically ill  with multiple organ system failure and requires high complexity decision making for assessment and support, frequent evaluation and titration of therapies, advanced monitoring, review of radiographic studies and interpretation of complex data.   Critical Care Time devoted to patient care services, exclusive of separately billable procedures, described in this note is 35 minutes.  Posey Boyer, NP [facility] 01/27/23 9:37 AM  Please see [email or web address] for pager details.  From 7A-7P if no response, please call [phone] After hours,  please call ELink [phone]

## 2023-01-27 NOTE — Progress Notes (Signed)
Patient ID: Phillip Young, male   DOB: 05/21/1986, 37 y.o.   MRN: [id number]  Patient failed DCCV attempt x 3 with sternal pressure.  He converted to NSR for about 5-6 beats each time with reversion to rapid atrial flutter.   Unfortunately, we have limited options here.  Unable to use amiodarone given history of severe lung toxicity.  No other anti-arrhythmic option.  Unable to use beta blocker with hypotension.  NE dose increasing with atrial flutter.  Only option I can see is to back off on CVVH, see if we can get NE dose back down, and re-attempt DCCV.  He has end stage HF on dialysis and has not been compliant with dialysis. If we cannot control his heart rate, we are out of options to stabilize him. I think goals of care conversations with family would be appropriate.  I spoke with his mother who is realistic about the situation, and we will enlist palliative care.  I think it would be reasonable to attempt DCCV one more time later today or tomorrow, and if we cannot restore him to NSR, will need to consider comfort/palliative care.  Will consult palliative care.   30 min critical care time.   Marca Ancona 01/27/2023 11:06 AM

## 2023-01-28 DIAGNOSIS — Z515 Encounter for palliative care: Secondary | ICD-10-CM | POA: Diagnosis not present

## 2023-01-28 DIAGNOSIS — R6520 Severe sepsis without septic shock: Secondary | ICD-10-CM | POA: Diagnosis not present

## 2023-01-28 DIAGNOSIS — J9601 Acute respiratory failure with hypoxia: Secondary | ICD-10-CM | POA: Diagnosis not present

## 2023-01-28 DIAGNOSIS — N186 End stage renal disease: Secondary | ICD-10-CM | POA: Diagnosis not present

## 2023-01-28 DIAGNOSIS — A419 Sepsis, unspecified organism: Secondary | ICD-10-CM | POA: Diagnosis not present

## 2023-01-28 DIAGNOSIS — E8770 Fluid overload, unspecified: Secondary | ICD-10-CM | POA: Diagnosis not present

## 2023-01-28 DIAGNOSIS — I4892 Unspecified atrial flutter: Secondary | ICD-10-CM | POA: Diagnosis not present

## 2023-01-28 LAB — CBC
HCT: 41.5 % (ref 39.0–52.0)
Hemoglobin: 13.1 g/dL (ref 13.0–17.0)
MCH: 31.3 pg (ref 26.0–34.0)
MCHC: 31.6 g/dL (ref 30.0–36.0)
MCV: 99.3 fL (ref 80.0–100.0)
Platelets: 399 10*3/uL (ref 150–400)
RBC: 4.18 MIL/uL — ABNORMAL LOW (ref 4.22–5.81)
RDW: 16.6 % — ABNORMAL HIGH (ref 11.5–15.5)
WBC: 16.5 10*3/uL — ABNORMAL HIGH (ref 4.0–10.5)
nRBC: 0.5 % — ABNORMAL HIGH (ref 0.0–0.2)

## 2023-01-28 LAB — RENAL FUNCTION PANEL
Albumin: 4 g/dL (ref 3.5–5.0)
Albumin: 4 g/dL (ref 3.5–5.0)
Anion gap: 14 (ref 5–15)
Anion gap: 15 (ref 5–15)
BUN: 46 mg/dL — ABNORMAL HIGH (ref 6–20)
BUN: 55 mg/dL — ABNORMAL HIGH (ref 6–20)
CO2: 21 mmol/L — ABNORMAL LOW (ref 22–32)
CO2: 18 mmol/L — ABNORMAL LOW (ref 22–32)
Calcium: 10.3 mg/dL (ref 8.9–10.3)
Calcium: 10.4 mg/dL — ABNORMAL HIGH (ref 8.9–10.3)
Chloride: 96 mmol/L — ABNORMAL LOW (ref 98–111)
Chloride: 100 mmol/L (ref 98–111)
Creatinine, Ser: 3.53 mg/dL — ABNORMAL HIGH (ref 0.61–1.24)
Creatinine, Ser: 4.06 mg/dL — ABNORMAL HIGH (ref 0.61–1.24)
GFR, Estimated: 22 mL/min — ABNORMAL LOW (ref >60)
GFR, Estimated: 19 mL/min — ABNORMAL LOW (ref >60)
Glucose, Bld: 141 mg/dL — ABNORMAL HIGH (ref 70–99)
Glucose, Bld: 148 mg/dL — ABNORMAL HIGH (ref 70–99)
Phosphorus: 5 mg/dL — ABNORMAL HIGH (ref 2.5–4.6)
Phosphorus: 5.3 mg/dL — ABNORMAL HIGH (ref 2.5–4.6)
Potassium: 5.6 mmol/L — ABNORMAL HIGH (ref 3.5–5.1)
Potassium: 5.4 mmol/L — ABNORMAL HIGH (ref 3.5–5.1)
Sodium: 131 mmol/L — ABNORMAL LOW (ref 135–145)
Sodium: 133 mmol/L — ABNORMAL LOW (ref 135–145)

## 2023-01-28 LAB — GLUCOSE, CAPILLARY
Glucose-Capillary: 116 mg/dL — ABNORMAL HIGH (ref 70–99)
Glucose-Capillary: 128 mg/dL — ABNORMAL HIGH (ref 70–99)
Glucose-Capillary: 157 mg/dL — ABNORMAL HIGH (ref 70–99)
Glucose-Capillary: 157 mg/dL — ABNORMAL HIGH (ref 70–99)
Glucose-Capillary: 160 mg/dL — ABNORMAL HIGH (ref 70–99)
Glucose-Capillary: 170 mg/dL — ABNORMAL HIGH (ref 70–99)

## 2023-01-28 LAB — MAGNESIUM: Magnesium: 3.2 mg/dL — ABNORMAL HIGH (ref 1.7–2.4)

## 2023-01-28 LAB — CULTURE, BLOOD (ROUTINE X 2): Culture: NO GROWTH

## 2023-01-28 LAB — TRIGLYCERIDES: Triglycerides: 302 mg/dL — ABNORMAL HIGH (ref <150)

## 2023-01-28 LAB — COOXEMETRY PANEL
Carboxyhemoglobin: 2.1 % — ABNORMAL HIGH (ref 0.5–1.5)
Methemoglobin: <0.7 % (ref 0.0–1.5)
O2 Saturation: 64.1 %
Total hemoglobin: 13.9 g/dL (ref 12.0–16.0)

## 2023-01-28 MED ORDER — ACETAMINOPHEN 650 MG RE SUPP: 650.0000 mg | Freq: Four times a day (QID) | RECTAL | Status: DC | PRN

## 2023-01-28 MED ORDER — ACETAMINOPHEN 160 MG/5ML PO SOLN
650.0000 mg | Freq: Four times a day (QID) | ORAL | Status: DC | PRN
  Administered 2023-01-28 – 2023-02-26 (×25): 650 mg
  Filled 2023-01-28 (×26): qty 20.3

## 2023-01-28 MED ORDER — HEPARIN SODIUM (PORCINE) 5000 UNIT/ML IJ SOLN
INTRAMUSCULAR | Status: AC
  Filled 2023-01-28: qty 1

## 2023-01-28 MED ORDER — SODIUM ZIRCONIUM CYCLOSILICATE 10 G PO PACK
10.0000 g | PACK | Freq: Once | ORAL | Status: AC
  Administered 2023-01-28: 10 g via ORAL
  Filled 2023-01-28: qty 1

## 2023-01-28 MED ORDER — SENNOSIDES-DOCUSATE SODIUM 8.6-50 MG PO TABS
1.0000 | ORAL_TABLET | Freq: Two times a day (BID) | ORAL | Status: DC
  Administered 2023-01-28 – 2023-01-31 (×6): 1
  Filled 2023-01-28 (×6): qty 1

## 2023-01-28 MED ORDER — PRISMASOL BGK 0/2.5 32-2.5 MEQ/L EC SOLN
Status: DC
  Filled 2023-01-28 (×23): qty 5000

## 2023-01-28 NOTE — Progress Notes (Addendum)
NAME:  Phillip Young, MRN:  [id number], DOB:  [birth year], LOS: 7 ADMISSION DATE:  01/21/2023, CONSULTATION DATE:  01/22/23 REFERRING MD:  Danise Edge, CHIEF COMPLAINT:  SOB   History of Present Illness:  37 year old man well known to the PCCM and AHF services who presented to [facility] 6/28 with fevers, joint pains, SOB. PMHx significant for ESRD, NICM, refractory AFib, ?amio-induced lung toxicity. Patient reportedly missed HD on Thursday (has a history of leaving HD early). Reportedly ~10kg over dry weight with worsening breathing status, AFib/RVR and borderline BP.  PCCM consulted for ICU admission/further evaluation and management.  Pertinent Medical History:   Past Medical History:  Diagnosis Date   Acute on chronic respiratory failure with hypoxia ([name]) 04/21/2021   Acute on chronic systolic (congestive) heart failure ([name]) 02/26/2020   Amiodarone toxicity    Anemia    Atrial flutter ([name])    Biventricular congestive heart failure ([name])    Chronic hypoxemic respiratory failure ([name])    Class 3 severe obesity due to excess calories with serious comorbidity and body mass index (BMI) of 50.0 to 59.9 in adult [facility]) 02/26/2020   Essential hypertension 02/26/2020   GERD without esophagitis 02/26/2020   Hidradenitis suppurativa 02/26/2020   NICM (nonischemic cardiomyopathy) ([name])    Obesity hypoventilation syndrome ([name])    OSA (obstructive sleep apnea)    PAF (paroxysmal atrial fibrillation) ([name])    Pneumonia    Prediabetes 02/26/2020   Significant Hospital Events: Including procedures, antibiotic start and stop dates in addition to other pertinent events   6/28 Admit, emergent HD 6/29 Afib/RVR, shock, ICU transfer 6/30 Intubated and placed on vasopressor support and started on CRRT 7/1 Remains intubated on CRRT continue with pressor support 7/2 Agitated with SAT, remains on CRRT and low dose levophed 7/3 low dose NE, ongoing CRRT 7/4 Attempted SBT this am, apneic but severely agitated,  desaturated, HR up 130/140s.  Re-sedated but HR remains 140's, NE up from 6 to . NSR on EKG.  Was pulling 50-100on UF, since kept even for now.  CVP 8 7/5 DCCV again attempted this a.m. with success, remains on norepinephrine and vasopressin.   Interim History / Subjective:  Will Flicker eyes open to verbal stimuli  Objective:  Blood pressure 97/76, pulse 93, temperature 100.1 F (37.8 C), temperature source Axillary, resp. rate (!) 24, height 6' (1.829 m), weight (!) 173.3 kg, SpO2 99 %. CVP:  [5 mmHg-27 mmHg] 8 mmHg  Vent Mode: PRVC FiO2 (%):  [40 %-60 %] 60 % Set Rate:  [24 bmp] 24 bmp Vt Set:  [620 mL] 620 mL PEEP:  [5 cmH20] 5 cmH20 Plateau Pressure:  [20 cmH20-25 cmH20] 20 cmH20   Intake/Output Summary (Last 24 hours) at 01/28/2023 0931 Last data filed at 01/28/2023 0900 Gross per 24 hour  Intake 3228.61 ml  Output 1653.3 ml  Net 1575.31 ml    Filed Weights   01/26/23 0500 01/27/23 0600 01/28/23 0500  Weight: (!) 178.4 kg (!) 175 kg (!) 173.3 kg   Physical Examination: General: Acute ill-appearing adult male lying in bed on mechanical ventilation in no acute distress HEENT: ETT, MM pink/moist, PERRL,  Neuro: Opens eyes to verbal stimuli, nonfocal CV: s1s2 regular rate and rhythm, no murmur, rubs, or gallops,  PULM: Clear to auscultation bilaterally, no increased work of breathing, no added breath sounds GI: soft, bowel sounds active in all 4 quadrants, non-tender, non-distended, tolerating TF Extremities: warm/dry, no edema  Skin: no rashes or lesions  Resolved Hospital Problem List:  N/A  Assessment & Plan:   Acute-on-chronic hypoxemic respiratory failure, multifactorial including pneumonia and volume overload due to noncompliance with hemodialysis History of OHS/OSA -Patient presented with shortness of breath, he was noted to be volume overloaded, he missed hemodialysis session but usually does not complete hemodialysis. Noted to be volume overloaded with CXR  suggestive of bilateral lower lobe pneumonia. P: Weaning trials limited by significant agitation unstable cardiac rhythm. No weaning today per HF Continue ventilator support with lung protective strategies  Wean PEEP and FiO2 for sats greater than 90%. Head of bed elevated 30 degrees. Plateau pressures less than 30 cm H20.  Follow intermittent chest x-ray and ABG.   SAT/SBT as tolerated, mentation preclude extubation  Ensure adequate pulmonary hygiene  Follow cultures  VAP bundle in place  PAD protocol  Septic shock due to strep pneumo bacteremia, POA -BCx grew strep pneumo, likely secondary to bilateral lower lobe pneumonia. P: Remains on pressor support with MAP goal greater than 65 Remains on p.o. midodrine max dose Follow CBC and fever curve Remains on empiric ceftriaxone  Acute on chronic HFrEF Nonischemic cardiomyopathy -Echocardiogram 5/24 with unchanged EF of 20 to 25% History of paroxysmal rapid A-fib/flutter with history of possible amiodarone toxicity -History of multiple DCCVs with history of amiodarone toxicity treated with steroids - Per AHF, not a candidate for advanced therapies at this time P: Heart failure following, appreciate assistance DCCV successful a.m. 7/5 Continuous telemetry Optimize electrolytes, K greater than 4, mag greater than 2 Strict intake and output, daily weight CRRT for volume Continue Eliquis.  End-stage renal disease on hemodialysis Hypervolemic hyponatremia - improving Hyperphosphatemia Hyperkalemia - improved High anion gap metabolic acidosis -On HD T/Th/Sa but is often noncompliant with sessions. P: Nephrology following, appreciate assistance  CRRT per nephrology Follow renal function  Trend Bmet Avoid nephrotoxins Ensure adequate renal perfusion   Acute septic encephalopathy P: Maintain neuro protective measures; goal for eurothermia, euglycemia, eunatermia, normoxia, and PCO2 goal of 35-40 Nutrition and bowel regiment   Seizure precautions  Aspirations precautions  Continue to correct metabolic derangements as above  Hematuria POA -No hydronephrosis, medical renal disease seen on renal ultrasound. -Possible traumatic Foley insertion P: As needed bladder scans Trend H&H  Morbid obesity At risk for malnutrition Constipation  P: Continue tube feed RD following Protein supplementation Bowel regiment  Goals of care Mother updated at bedside 6/30 with decision to continue with aggressive interventions, given multisystem organ involvement with medication and medical noncompliance needs ongoing goals of care discussions. - PMT following  - Mother updated at bedside 7/follow-up  Best Practice (right click and "Reselect all SmartList Selections" daily)   Diet/type: ube feeds DVT prophylaxis: DOAC GI prophylaxis: Protonix Lines: Yes still needed Foley: n/a Code Status:  full code Last date of multidisciplinary goals of care: 6/30  Pending update 7/4  Critical care time:  CRITICAL CARE Performed by: Kenith Trickel D. Harris  Total critical care time: 40 minutes  Critical care time was exclusive of separately billable procedures and treating other patients.  Critical care was necessary to treat or prevent imminent or life-threatening deterioration.  Critical care was time spent personally by me on the following activities: development of treatment plan with patient and/or surrogate as well as nursing, discussions with consultants, evaluation of patient's response to treatment, examination of patient, obtaining history from patient or surrogate, ordering and performing treatments and interventions, ordering and review of laboratory studies, ordering and review of radiographic studies, pulse oximetry and re-evaluation of patient's condition.  Trevione Wert D. Harris, NP-C [facility] Personal contact information can be found on Amion  If no contact or response made please call  [phone] 01/28/2023, 9:34 AM

## 2023-01-28 NOTE — Progress Notes (Signed)
Daily Progress Note   Patient Name: Phillip Young       Date: 01/28/2023 DOB: [birth year]  Age: 37 y.o. MRN#: [id number] Attending Physician: Cheri Fowler, MD Primary Care Physician: Marrianne Mood, MD Admit Date: 01/21/2023  Reason for Consultation/Follow-up: Establishing goals of care  Subjective: I have reviewed medical records including EPIC notes, MAR, and labs. Patient now in NSR after successful cardioversion this morning. Received report from primary RN - no acute concerns.  Went to visit patient at bedside - mother/Lillian and uncle/Wayne present. RN at bedside providing care. Patient was lying in bed intubated and sedated. Pressors being weaned down per MAR; fentanyl recently titrated up due to restlessness per RN. Tube feeds running. Patient continues to be very ill with multiorgan system failure.  Emotional support provided to family. [name] quickly expresses her frustration that fentanyl was increased. She states "he will never come off that machine if he's doped up." Reviewed that with restlessness, despite use of mitts, patient would be at high risk of pulling out or dislodging lines/tubes. We also discussed that agitation may increase his risk of reverting into abnormal heart rhythms. In consideration of each instance, we reviewed that patient would be at high risk for cardiac/respiratory arrest and death. RN also provided education regarding titration of fentanyl. Despite multiple attempts at education, [name] continues to express wanting to "get him off all of these medications." Reviewed option of weaning medications with goal to allow nature to take it's course - in the event of decline, would transition to full comfort measures. She expresses not being ready for comfort measures  because patient "wants to live."   Attempted to discuss patient's thoughts/wishes - therapeutic listening provided as she reflects on his previous thoughts about enrolling in hospice because "he was suffering." However, at other times he "wanted to fight." Reviewed with family that patient remains very fragile with multiple organ system failure. We reviewed his poor prognosis. [name] wants to continue aggressive care.   She asks if endotracheal tube can cause damage to vocal chords. Validated it is a risk. Attempted to reiterate this is why it is only considered a short term intervention and pending his clinical course, family may be faced with decisions regarding long term life support/trach. [name] continues to feel if fentanyl is weaned, he would show improvement. She states "that's  just how he is (in regards to restlessness)." Continued education as outlined about regarding medical indication.   Ultimately, she does indicate understanding but states "I'm just frustrated." Validation provided patient and family are going through something very difficult and they are facing difficult decisions.  Encouraged family to consider DNR status understanding evidenced based poor outcomes in similar hospitalized patient, as the cause of arrest is likely associated with advanced chronic/terminal illness rather than an easily reversible acute cardio-pulmonary event.  I shared that even if we pursued resuscitation we would not able to resolve the underlying factors. [name] states she does not want DNR because she feels interventions would stop being offered. I explained that DNR does not change the medical plan and it only comes into effect after a person has arrested (died).  It is a protective measure to keep [place] from harming the patient in their last moments of life. [name] was not agreeable to DNR with understanding that he would receive CPR, defibrillation, ACLS medications (already intubated).    [name]  would like to continue full code/full scope care at this time. She is open to ongoing GOC pending his clinical course.  All questions and concerns addressed. Encouraged to call with questions and/or concerns. PMT card previously provided.  Discussed case in detail with uncle/Wayne outside patient's room per his request. He indicates understanding why it is important fentanyl is titrated with goal to prolong patient's life. Deniece Portela understands that patient was noncompliant with HD and diet, which has lead to his current acute ill state. He tells me "all my family are in the medical field and I get it." He will continue to discuss patient's poor prognosis and need for sedation in light of patient's current acute situation with [name].   All questions and concerns addressed. Encouraged to call with questions and/or concerns. PMT card provided.  Length of Stay: 7  Current Medications: Scheduled Meds:   apixaban  5 mg Per Tube BID   Chlorhexidine Gluconate Cloth  6 each Topical Q0600   darbepoetin (ARANESP) injection - DIALYSIS  100 mcg Subcutaneous Q Mon-1800   docusate  100 mg Per Tube BID   feeding supplement (PROSource TF20)  60 mL Per Tube QID   fentaNYL (SUBLIMAZE) injection  50 mcg Intravenous Once   heparin       hydrOXYzine  10 mg Per Tube TID   lidocaine  1 patch Transdermal Q24H   melatonin  5 mg Per Tube QHS   midodrine  15 mg Per Tube Q8H   multivitamin  1 tablet Per Tube QHS   mouth rinse  15 mL Mouth Rinse Q2H   pantoprazole (PROTONIX) IV  40 mg Intravenous Q24H   polyethylene glycol  17 g Per Tube BID    Continuous Infusions:  sodium chloride Stopped (01/22/23 1859)   cefTRIAXone (ROCEPHIN)  IV Stopped (01/28/23 1036)   feeding supplement (VITAL 1.5 CAL) 55 mL/hr at 01/28/23 1100   fentaNYL infusion INTRAVENOUS 200 mcg/hr (01/28/23 1100)   midazolam 5 mg/hr (01/28/23 1100)   norepinephrine (LEVOPHED) Adult infusion 10 mcg/min (01/28/23 1100)   prismasol B22GK 4/0 400  mL/hr at 01/28/23 0320   prismasol B22GK 4/0 400 mL/hr at 01/28/23 0322   prismasol BGK 2/2.5 dialysis solution 1,500 mL/hr at 01/28/23 1019   propofol (DIPRIVAN) infusion Stopped (01/27/23 1719)   vasopressin 0.04 Units/min (01/28/23 1100)    PRN Meds: sodium chloride, acetaminophen **OR** acetaminophen, bisacodyl, fentaNYL, heparin, midazolam, ondansetron (ZOFRAN) IV, mouth rinse, polyvinyl alcohol  Physical  Exam Vitals and nursing note reviewed.  Constitutional:      General: He is not in acute distress.    Appearance: He is ill-appearing.     Interventions: He is sedated and intubated.  Pulmonary:     Effort: No respiratory distress. He is intubated.  Skin:    General: Skin is warm and dry.  Neurological:     Mental Status: He is unresponsive.             Vital Signs: BP 115/66   Pulse 93   Temp 98 F (36.7 C) (Oral)   Resp (!) 24   Ht 6' (1.829 m)   Wt (!) 173.3 kg   SpO2 99%   BMI 51.82 kg/m  SpO2: SpO2: 99 % O2 Device: O2 Device: Ventilator O2 Flow Rate: O2 Flow Rate (L/min): 13 L/min  Intake/output summary:  Intake/Output Summary (Last 24 hours) at 01/28/2023 1249 Last data filed at 01/28/2023 1100 Gross per 24 hour  Intake 3149.55 ml  Output 2158.3 ml  Net 991.25 ml   LBM: Last BM Date : 01/21/23 Baseline Weight: Weight: (!) 182.1 kg Most recent weight: Weight: (!) 173.3 kg       Palliative Assessment/Data: PPS 30% with tube feeds      Patient Active Problem List   Diagnosis Date Noted   Pressure injury of skin 01/23/2023   Acute hypoxic respiratory failure ([name]) 01/21/2023   A-fib ([name]) 01/21/2023   Acute on chronic hypoxic respiratory failure ([name]) 08/07/2022   Decompensated heart failure ([name]) 08/05/2022   Elevated liver enzymes 06/08/2022   Multiple nodules of lung 05/31/2022   Sepsis ([name])    Advanced care planning/counseling discussion    Recurrent infections 09/30/2021   Reactive depression 09/02/2021   Hypervolemia 08/18/2021   Type  2 diabetes mellitus with diabetic chronic kidney disease ([name]) 06/26/2021   Medical non-compliance 04/21/2021   Hypotension 04/21/2021   ESRD (end stage renal disease) on dialysis ([name]) 04/16/2021   Paroxysmal atrial flutter ([name]) 04/12/2021   Chronic respiratory failure with hypoxia ([name]) 02/11/2021   Chronic systolic CHF (congestive heart failure) ([name])    Chronic cough    Morbid obesity ([name]) 02/26/2020   GERD without esophagitis 02/26/2020   OSA (obstructive sleep apnea) 02/26/2020   Hidradenitis suppurativa 02/26/2020    Palliative Care Assessment & Plan   Patient Profile: 37 y.o. male  with past medical history of AF, HFrEF (EF 20-25%), severe obesity, ESRD on HD, chronic hypoxic respiratory failure, OSA presented to ED on 01/22/23 from home with severe dyspnea - poor adherence to outpatient HD and fluid restriction. Patient was admitted on 01/21/2023 with acute on chronic hypoxic respiratory failure, hypervolemic, sepsis/septic shock, afib with RVR, chest pain, hematuria. CRRT was started 01/22/23. Cardiology was consulted: he is not a candidate for advanced therapies or ICD due to frailty, obesity, and dialysis. Cardioversion was attempted x3 on 01/27/23 without success. Options are very limited. If NSR cannot be restored, family will need to consider comfort measures. PMT was consulted to assist family with GOC.   Assessment: Principal Problem:   Acute hypoxic respiratory failure ([name]) Active Problems:   Morbid obesity ([name])   Chronic systolic CHF (congestive heart failure) ([name])   ESRD (end stage renal disease) on dialysis ([name])   Hypervolemia   Sepsis ([name])   A-fib ([name])   Pressure injury of skin   Concern about end of life  Recommendations/Plan: Continue full code/full scope Mother is not accepting of patient's poor prognosis at this  time but other family members are. Family will continue discussions with her. Mother is open to ongoing GOC pending patient's clinical course   PMT will continue to follow and support holistically  Goals of Care and Additional Recommendations: Limitations on Scope of Treatment: Full Scope Treatment  Code Status:    Code Status Orders  (From admission, onward)           Start     Ordered   01/21/23 2051  Full code  Continuous       Question:  By:  Answer:  Consent: discussion documented in EHR   01/21/23 2058           Code Status History     Date Active Date Inactive Code Status Order ID Comments User Context   08/05/2022 2158 08/13/2022 1951 Full Code [id number]  Doran Stabler, DO ED   05/22/2022 1545 06/10/2022 2021 Full Code [id number]  [name] Mann, DO ED   03/26/2022 0830 03/28/2022 2024 Full Code [id number]  Marolyn Haller, MD ED   09/02/2021 1646 09/04/2021 1906 Full Code [id number]  Marolyn Haller, MD ED   08/18/2021 2033 08/23/2021 0004 Full Code [id number]  Merrilyn Puma, MD ED   04/12/2021 2154 05/20/2021 0156 Full Code [id number]  Hillary Bow, DO ED   02/11/2021 1849 03/26/2021 1740 Full Code [id number]  Orland Mustard, MD ED   10/08/2020 1424 10/16/2020 2059 Full Code [id number]  Claudean Severance, MD ED   02/26/2020 2340 03/11/2020 2330 Full Code [id number]  Shalhoub, Deno Lunger, MD ED       Prognosis:  Guarded/poor  Discharge Planning: To Be Determined  Care plan was discussed with primary RN, patient's family, Dr. Merrily Pew  Thank you for allowing the Palliative Medicine Team to assist in the care of this patient.   Total Time 70 minutes Prolonged Time Billed  yes       Greater than 50%  of this time was spent counseling and coordinating care related to the above assessment and plan.  Haskel Khan, NP  Please contact Palliative Medicine Team phone at [phone] for questions and concerns.   *Portions of this note are a verbal dictation therefore any spelling and/or grammatical errors are due to the "[employer]agon [employer] One" system interpretation.

## 2023-01-28 NOTE — Interval H&P Note (Signed)
History and Physical Interval Note:  01/28/2023 8:35 AM  Phillip Young  has presented today for surgery, with the diagnosis of * No surgery found *.  The various methods of treatment have been discussed with the patient and family. After consideration of risks, benefits and other options for treatment, the patient has consented to  * No surgery found * as a surgical intervention.  The patient's history has been reviewed, patient examined, no change in status, stable for surgery.  I have reviewed the patient's chart and labs.  Questions were answered to the patient's satisfaction.     [name] [employer]

## 2023-01-28 NOTE — Procedures (Signed)
Electrical Cardioversion Procedure Note Alejandra Lemmo [id number] 1985/12/12  Procedure: Electrical Cardioversion Indications:  Atrial Flutter  Procedure Details Consent: Risks of procedure as well as the alternatives and risks of each were explained to the (patient/caregiver).  Consent for procedure obtained. Time Out: Verified patient identification, verified procedure, site/side was marked, verified correct patient position, special equipment/implants available, medications/allergies/relevent history reviewed, required imaging and test results available.  Performed  Patient placed on cardiac monitor, pulse oximetry, supplemental oxygen as necessary.  Sedation given:  Propofol per anesthesiology Pacer pads placed anterior and posterior chest.  Cardioverted 1 time(s).  Cardioverted at 200J.  Evaluation Findings: Post procedure EKG shows: NSR Complications: None Patient did tolerate procedure well.   Marca Ancona 01/28/2023, 8:36 AM

## 2023-01-28 NOTE — Progress Notes (Signed)
   01/28/23 1000  Spiritual Encounters  Type of Visit Initial  Care provided to: [facility] partners present during encounter Nurse  Referral source Family  Reason for visit End-of-life  OnCall Visit No  Spiritual Framework  Values/beliefs Faith  Community/Connection Faith community  Family Stress Factors Health changes  Interventions  Spiritual Care Interventions Made Compassionate presence;Reflective listening;Prayer  Intervention Outcomes  Outcomes Connection to spiritual care;Awareness of support   Ch responded to request for emotional and spiritual support. [name] mother was at bedside. Mother is holding on hope that her son would recover from his illness. She is using her faith as a mean of support. Believing that Jesus will make a way. Ch Accompanied mother in singing spiritual song and brought a calming presence. Family asked to pray for healing. Ch offered a word of prayer. No follow-up needed at this time.

## 2023-01-28 NOTE — Progress Notes (Addendum)
Patient ID: Phillip Young, male   DOB: 02/04/1986, 37 y.o.   MRN: [id number]     Advanced Heart Failure Rounding Note  PCP-Cardiologist: Marca Ancona, MD   Subjective:   7/4 Back in A fib RVR and underwent urgent cardioversion x3 but failed to convert. Pressor requirements increased. Vaso added and Norepi increased.   Remains intubated, sedated.   BCx + for Strep pneumoniae. Abx narrowed to ceftriaxone. Afebrile. WBC 26>>20>>11>>14>>16.5K   On CVVHD, currently pulling 150 cc/hr. Weight down 8 lbs.    On NE 16 + Vaso 0.03 units + midodrine 15 mg tid.   Mom at bedside.   Objective:   Weight Range: (!) 173.3 kg Body mass index is 51.82 kg/m.   Vital Signs:   Temp:  [96.9 F (36.1 C)-100.7 F (38.2 C)] 100.7 F (38.2 C) (07/05 0400) Pulse Rate:  [76-185] 163 (07/05 0645) Resp:  [23-35] 26 (07/05 0700) BP: (93-122)/(57-92) 115/87 (07/05 0700) SpO2:  [88 %-100 %] 94 % (07/05 0645) Arterial Line BP: (84-117)/(52-80) 110/66 (07/05 0700) FiO2 (%):  [30 %-60 %] 60 % (07/05 0321) Weight:  [173.3 kg] 173.3 kg (07/05 0500) Last BM Date : 01/21/23  Weight change: Filed Weights   01/26/23 0500 01/27/23 0600 01/28/23 0500  Weight: (!) 178.4 kg (!) 175 kg (!) 173.3 kg    Intake/Output:   Intake/Output Summary (Last 24 hours) at 01/28/2023 0732 Last data filed at 01/28/2023 0700 Gross per 24 hour  Intake 3216.35 ml  Output 1787.7 ml  Net 1428.65 ml      Physical Exam   General:  Intubated. No resp difficulty HEENT: ETT  Neck: supple. JVP difficult to assess. Carotids 2+ bilat; no bruits. No lymphadenopathy or thryomegaly appreciated. RIJ Cor: PMI nondisplaced. Tachy Irregular rate & rhythm. No rubs, gallops or murmurs. Lungs: clear Abdomen: obese soft, nontender, nondistended. No hepatosplenomegaly. No bruits or masses. Good bowel sounds. Extremities: no cyanosis, clubbing, rash, edema Neuro: Sedated   Telemetry  A fib RVR 150-160s    EKG    N/A   Labs     CBC Recent Labs    01/27/23 0318 01/28/23 0330  WBC 14.1* 16.5*  HGB 13.5 13.1  HCT 41.6 41.5  MCV 98.1 99.3  PLT 368 399   Basic Metabolic Panel Recent Labs    95/28/41 0318 01/27/23 1613 01/28/23 0330  NA 131* 130* 131*  K 5.0 5.4* 5.6*  CL 92* 96* 96*  CO2 20* 18* 21*  GLUCOSE 139* 139* 141*  BUN 40* 42* 46*  CREATININE 3.19* 3.33* 3.53*  CALCIUM 11.0* 10.4* 10.3  MG 3.2*  --  3.2*  PHOS 4.5 4.5 5.0*   Liver Function Tests Recent Labs    01/27/23 1613 01/28/23 0330  ALBUMIN 4.1 4.0   No results for input(s): "LIPASE", "AMYLASE" in the last 72 hours. Cardiac Enzymes No results for input(s): "CKTOTAL", "CKMB", "CKMBINDEX", "TROPONINI" in the last 72 hours.  BNP: BNP (last 3 results) Recent Labs    05/22/22 0732 08/05/22 1720 01/21/23 1739  BNP 373.1* 15.5 1,528.6*    ProBNP (last 3 results) No results for input(s): "PROBNP" in the last 8760 hours.   D-Dimer No results for input(s): "DDIMER" in the last 72 hours. Hemoglobin A1C No results for input(s): "HGBA1C" in the last 72 hours. Fasting Lipid Panel Recent Labs    01/28/23 0330  TRIG 302*   Thyroid Function Tests No results for input(s): "TSH", "T4TOTAL", "T3FREE", "THYROIDAB" in the last 72 hours.  Invalid input(s): "FREET3"  Other results:   Imaging    No results found.   Medications:     Scheduled Medications:  apixaban  5 mg Per Tube BID   Chlorhexidine Gluconate Cloth  6 each Topical Q0600   darbepoetin (ARANESP) injection - DIALYSIS  100 mcg Subcutaneous Q Mon-1800   docusate  100 mg Per Tube BID   feeding supplement (PROSource TF20)  60 mL Per Tube QID   fentaNYL (SUBLIMAZE) injection  50 mcg Intravenous Once   hydrOXYzine  10 mg Per Tube TID   lidocaine  1 patch Transdermal Q24H   melatonin  5 mg Per Tube QHS   midodrine  15 mg Per Tube Q8H   multivitamin  1 tablet Per Tube QHS   mouth rinse  15 mL Mouth Rinse Q2H   pantoprazole (PROTONIX) IV  40 mg Intravenous  Q24H   polyethylene glycol  17 g Per Tube BID    Infusions:  sodium chloride Stopped (01/22/23 1859)   cefTRIAXone (ROCEPHIN)  IV Stopped (01/27/23 1039)   feeding supplement (VITAL 1.5 CAL) 55 mL/hr at 01/28/23 0700   fentaNYL infusion INTRAVENOUS 175 mcg/hr (01/28/23 0700)   midazolam 5 mg/hr (01/28/23 0700)   norepinephrine (LEVOPHED) Adult infusion 16 mcg/min (01/28/23 0700)   prismasol B22GK 4/0 400 mL/hr at 01/28/23 0320   prismasol B22GK 4/0 400 mL/hr at 01/28/23 0322   prismasol BGK 4/2.5 1,500 mL/hr at 01/28/23 6962   propofol (DIPRIVAN) infusion Stopped (01/27/23 1719)   vasopressin 0.03 Units/min (01/28/23 0700)    PRN Medications: sodium chloride, acetaminophen **OR** acetaminophen, bisacodyl, fentaNYL, midazolam, ondansetron (ZOFRAN) IV, mouth rinse, polyvinyl alcohol    Patient Profile   37 y.o. 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 Started on CRRT. BCx + for strep pneumo   Assessment/Plan   1.  Acute on chronic systolic CHF: Nonischemic CMP, LHC 2021 with no CAD. Previous hospitalizations w/shock in setting of rapid AFib/Flutter requiring milrinone. Most recent echo 5/24 was unchanged with EF 20-25%, RV difficult to visualize.  He signs off early from HD and is significantly volume overloaded this admission. Suspect combination cardiogenic and septic shock at this time, Currently on Vasopressin 0.03 units, Norepi 16 mcg and midodrine 15 tid.  weight down significantly with aggressive UF via CVVH.  - CVP 8-9. On CCVH running even.    -- Continue NE and will increase Vaso 0.04 units.    - Continue Midodrine 15 mg tid  - Not  candidate for ICD given obesity and dialysis  - Not candidate for advanced therapies.  2. Atrial flutter/fibrillation: History of multiple DCCVs. Amiodarone previously stopped due to elevated LFTs. Restarted 05/20/22 by Dr. Elberta Fortis.  Concern for possible amiodarone pulmonary toxicity w/ elevated ESR and CRP 11/23. He has been off amiodarone and was treated with steroids.  AF [facility] in 12/23. He was readmitted with AF/RVR and was in SR but yesterday went back in A Fib RVR. Had urgent cardioversion x3 but failed to convert. Will attempt cardioversion today if unable to convert will likely need comfort care.   - Not candidate for amiodarone (lung and hepatic toxicity) or Tikosyn (ESRD), no anti-arrhythmic option.  - Continue Eliquis 5  mg bid  3. Possible amiodarone toxicity: Seen by pulmonary during admit 11/23. Had elevated ESR and sed rate. Treated with prednisone.   4. ESRD: HD per nephrology, on T,Th,Sat schedule.  Poor compliance with chronic treatment (signs off early). Significantly volume overloaded at admission.-  CVVH running even .  5. ID/Bacteremia: Fever at admission, now afebrile.  Suspect PNA. BCx + for Strep pneumoniae.  - abx narrowed to ceftriaxone per PCCM  6. OHS/OSA: Has been on CPAP and home oxygen.  7. Lower extremity wounds: - Per primary  - Wound consulted 8. Acute on chronic hypoxemic respiratory failure: Baseline OHS/OSA, now suspect PNA and CHF/volume overload. Intubated.  - Antibiotics as above.  - CVVH.  9. GOC  Palliative Care following. As above if cardioversion unsuccessful, there are no other options. I spoke with his Mom about possible transition to comfort care. She was open to comfort if cardioversion does work.  Chaplin asked to come and talk to her.   Length of Stay: 7   Amy Clegg NP-C  7:32 AM  Patient seen with NP, agree with the above note.   He remains on NE 16, vasopressin 0.04.  MAP stable on this regimen.  CVVH running even.  He  has been in atrial flutter rates 150s-160s since failing DCCV yesterday.  CVP 8-9.   This morning, he had successful DCCV back to NSR.   General: NAD, obese.  Intubated.  Neck: Thick, JVP difficult, no thyromegaly or thyroid nodule.  Lungs: Clear to auscultation bilaterally with normal respiratory effort. CV: Nondisplaced PMI.  Heart regular S1/S2, no S3/S4, no murmur.  Trace ankle edema.  Abdomen: Soft, nontender, no hepatosplenomegaly, no distention.  Skin: Intact without lesions or rashes.  Neurologic: Alert and oriented x 3.  Psych: Normal affect. Extremities: No clubbing or cyanosis.  HEENT: Normal.   Patient is back in NSR after DCCV this morning.  BP stable, will try to wean down norepinephrine and use vasopressin as much as possible for maintenance of MAP.  Also on midodrine.   CVP 8-9, aim for UF via CVVH to pull fluid gently net 50 cc/hr negative.    Would not wean vent today (weaning yesterday led to agitation and onset of AFL).  Maybe tomorrow if we can wean off norepinephrine.   He has no option for antiarrhythmics/maintenance of NSR.  We can only cardiovert him (see prior discussions).   CRITICAL CARE Performed by: Marca Ancona  Total critical care time: 40 minutes  Critical care time was exclusive of separately billable procedures and treating other patients.  Critical care was necessary to treat or prevent imminent or life-threatening deterioration.  Critical care was time spent personally by me on the following activities: development of treatment plan with patient and/or surrogate as well as nursing, discussions with consultants, evaluation of patient's response to treatment, examination of patient, obtaining history from patient or surrogate, ordering and performing treatments and interventions, ordering and review of laboratory studies, ordering and review of radiographic studies, pulse oximetry and re-evaluation of patient's condition.  Marca Ancona 01/28/2023 8:41 AM

## 2023-01-28 NOTE — TOC Progression Note (Signed)
Transition of Care [facility]) - Progression Note    Patient Details  Name: Phillip Young MRN: [id number] Date of Birth: 18-Nov-1985  Transition of [facility]) CM/SW Contact  Elliot Cousin, RN Phone Number: [phone] 01/28/2023, 12:59 PM  Clinical Narrative:   HF TOC CM received call from pt's mother, she had questions about assisting with paperwork for his long-term disability. Will continue to assist with dc needs.     Expected Discharge Plan: Home/Self Care Barriers to Discharge: Continued Medical Work up  Expected Discharge Plan and Services   Discharge Planning Services: CM Consult   Living arrangements for the past 2 months: Single Family Home                                       Social Determinants of Health (SDOH) Interventions SDOH Screenings   Food Insecurity: No Food Insecurity (01/22/2023)  Housing: Low Risk  (01/22/2023)  Transportation Needs: No Transportation Needs (01/22/2023)  Utilities: Not At Risk (01/22/2023)  Alcohol Screen: Low Risk  (09/21/2022)  Depression (PHQ2-9): High Risk (09/21/2022)  Financial Resource Strain: High Risk (09/21/2022)  Physical Activity: Inactive (09/21/2022)  Social Connections: Unknown (09/21/2022)  Stress: No Stress Concern Present (09/21/2022)  Tobacco Use: Medium Risk (01/22/2023)    Readmission Risk Interventions    06/09/2022   12:19 PM 05/12/2021   10:35 AM 10/16/2020    2:10 PM  Readmission Risk Prevention Plan  Transportation Screening Complete Complete Complete  PCP or Specialist Appt within 5-7 Days   Complete  Home Care Screening   Complete  Medication Review (RN CM)   Referral to Pharmacy  Medication Review (RN Care Manager) Complete Complete   PCP or Specialist appointment within 3-5 days of discharge Complete Complete   HRI or Home Care Consult Complete Complete   SW Recovery Care/Counseling Consult Complete Complete   Palliative Care Screening Not Applicable Complete   Skilled Nursing Facility Not  Applicable Complete

## 2023-01-28 NOTE — Progress Notes (Signed)
No SBT per Dr. Shirlee Latch. RT will continue to monitor and be available as needed .

## 2023-01-28 NOTE — Progress Notes (Signed)
[facility] NEPHROLOGY PROGRESS NOTE  Assessment/ Plan: Pt is a 37 y.o. yo male chronic CHF, PAF, morbid obesity, OSA, ESRD on HD with pneumonia and CHF exacerbation.  OP HD orders: [facility], TTS. 4hrs 15 min. F180. Flow rates: 425/autoflow 1.5. 3k 2.5cal. EDW 180kg. Access: TDC. Meds: Mircers 200 mcg every two weeks (last dose 6/13), sensipar 60mg  three times per week, hectorol three times per week . Historically noncompliant with HD I.e. doesn't stay for full prescribed treatment times, high IDWG/does not adhere to fluid restriction   # ESRD: Required emergent HD on 6/29 because of fluid overload.  He is on Levophed and Vaso for hypotension/shock.  Started CRRT on 6/29 with UF goal 100-150 cc an hour as tolerated by BP.    He developed A-fib with RVR -> CV 7/5.  May be able to wean off CRRT in the next 24-48hrs and assess intermittent HD daily.  All 4K bath.  Changed to low calcium bath because of hypercalcemia.  Change dialysate to 2K.   Monitor lab.  Discussed with ICU nurse.  # Acute on chronic systolic CHF, nonischemic: EF 16-10%.  Optimize volume with CRRT.  Discussed with the cardiology team.  Not a candidate for ICD and advanced therapies for CHF because of obesity and dialysis.  # Sepsis/septic shock: Currently on broad-spectrum antibiotics, per primary team.  Levophed as a pressor.  Adding midodrine.  # Anemia of CKD: Hemoglobin at goal.  Received Aranesp on 7/1.  # Hypercalcemia: Changed to 0 calcium and both pre and post filter.  Continue 2.5 calcium in dialysate.  Monitor lab.  # A-fib with RVR with hypotension: S/P cardioversion 7/5 -> NSR.   Subjective: Seen and examined in ICU.  Mother bedside. On Levo and Vaso.   . Objective Vital signs in last 24 hours: Vitals:   01/28/23 0815 01/28/23 0830 01/28/23 0845 01/28/23 0900  BP:    97/76  Pulse:  86 93 93  Resp: (!) 26 (!) 26 (!) 24 (!) 24  Temp:      TempSrc:      SpO2:  98% 99% 99%  Weight:       Height:       Weight change: -1.7 kg  Intake/Output Summary (Last 24 hours) at 01/28/2023 0931 Last data filed at 01/28/2023 0900 Gross per 24 hour  Intake 3228.61 ml  Output 1653.3 ml  Net 1575.31 ml       Labs: RENAL PANEL Recent Labs  Lab 01/24/23 0419 01/24/23 1520 01/25/23 0335 01/25/23 1611 01/26/23 0351 01/26/23 0352 01/26/23 1544 01/27/23 0318 01/27/23 1613 01/28/23 0330  NA 133*   < > 133*   < >  --  131* 132* 131* 130* 131*  K 4.2   < > 3.5   < >  --  3.7 4.4 5.0 5.4* 5.6*  CL 92*   < > 95*   < >  --  95* 97* 92* 96* 96*  CO2 22   < > 23   < >  --  23 19* 20* 18* 21*  GLUCOSE 143*   < > 117*   < >  --  120* 133* 139* 139* 141*  BUN 36*   < > 29*   < >  --  31* 31* 40* 42* 46*  CREATININE 4.76*   < > 3.60*   < >  --  3.29* 3.11* 3.19* 3.33* 3.53*  CALCIUM 9.3   < > 9.1   < >  --  9.8 10.3 11.0* 10.4* 10.3  MG 2.6*  --  2.8*  --  2.7*  --   --  3.2*  --  3.2*  PHOS 5.4*   < > 3.7   < >  --  3.6 3.6 4.5 4.5 5.0*  ALBUMIN 3.2*   < > 3.2*   < >  --  3.4* 3.6 4.3 4.1 4.0   < > = values in this interval not displayed.    Liver Function Tests: Recent Labs  Lab 01/21/23 1739 01/21/23 1842 01/27/23 0318 01/27/23 1613 01/28/23 0330  AST 18  --   --   --   --   ALT 11  --   --   --   --   ALKPHOS 97  --   --   --   --   BILITOT 2.3*  --   --   --   --   PROT 7.6  --   --   --   --   ALBUMIN 3.4*   < > 4.3 4.1 4.0   < > = values in this interval not displayed.   No results for input(s): "LIPASE", "AMYLASE" in the last 168 hours. No results for input(s): "AMMONIA" in the last 168 hours. CBC: Recent Labs    03/26/22 1958 03/27/22 0300 06/04/22 1924 06/05/22 0154 08/09/22 0042 08/10/22 0107 01/24/23 0419 01/25/23 0335 01/26/23 0351 01/27/23 0318 01/28/23 0330  HGB  --    < >  --    < > 6.7*   < > 9.7* 10.0* 10.7* 13.5 13.1  MCV  --    < >  --    < > 106.1*   < > 98.7 98.7 98.2 98.1 99.3  VITAMINB12 297  --  5,998*  --   --   --   --   --   --   --    --   FOLATE  --   --  15.7  --   --   --   --   --   --   --   --   FERRITIN 370*  --   --   --  156  --   --   --   --   --   --   TIBC 321  --   --   --  330  --   --   --   --   --   --   IRON 27*  --   --   --  28*  --   --   --   --   --   --    < > = values in this interval not displayed.    Cardiac Enzymes: No results for input(s): "CKTOTAL", "CKMB", "CKMBINDEX", "TROPONINI" in the last 168 hours. CBG: Recent Labs  Lab 01/27/23 1513 01/27/23 1959 01/27/23 2344 01/28/23 0340 01/28/23 0724  GLUCAP 138* 140* 138* 128* 116*    Iron Studies: No results for input(s): "IRON", "TIBC", "TRANSFERRIN", "FERRITIN" in the last 72 hours. Studies/Results: No results found.  Medications: Infusions:  sodium chloride Stopped (01/22/23 1859)   cefTRIAXone (ROCEPHIN)  IV Stopped (01/27/23 1039)   feeding supplement (VITAL 1.5 CAL) 55 mL/hr at 01/28/23 0900   fentaNYL infusion INTRAVENOUS 175 mcg/hr (01/28/23 0900)   midazolam 5 mg/hr (01/28/23 0900)   norepinephrine (LEVOPHED) Adult infusion 13 mcg/min (01/28/23 0900)   prismasol B22GK 4/0 400 mL/hr at 01/28/23 0320   prismasol B22GK 4/0 400  mL/hr at 01/28/23 0322   prismasol BGK 2/2.5 dialysis solution     propofol (DIPRIVAN) infusion Stopped (01/27/23 1719)   vasopressin 0.04 Units/min (01/28/23 0900)    Scheduled Medications:  apixaban  5 mg Per Tube BID   Chlorhexidine Gluconate Cloth  6 each Topical Q0600   darbepoetin (ARANESP) injection - DIALYSIS  100 mcg Subcutaneous Q Mon-1800   docusate  100 mg Per Tube BID   feeding supplement (PROSource TF20)  60 mL Per Tube QID   fentaNYL (SUBLIMAZE) injection  50 mcg Intravenous Once   hydrOXYzine  10 mg Per Tube TID   lidocaine  1 patch Transdermal Q24H   melatonin  5 mg Per Tube QHS   midodrine  15 mg Per Tube Q8H   multivitamin  1 tablet Per Tube QHS   mouth rinse  15 mL Mouth Rinse Q2H   pantoprazole (PROTONIX) IV  40 mg Intravenous Q24H   polyethylene glycol  17 g Per Tube  BID    have reviewed scheduled and prn medications.  Physical Exam: General: Ill looking male, intubated, sedated, CVP around 8. Heart: A-fib with RVR, s1s2 nl Lungs: Coarse breath sound bilateral Abdomen:soft, Non-tender, non-distended Extremities: Bilateral leg edema presents Dialysis Access: RIJ TDC in place.  Site clean.  Fermin Yan W 01/28/2023,9:31 AM  LOS: 7 days

## 2023-01-29 ENCOUNTER — Inpatient Hospital Stay (HOSPITAL_COMMUNITY): Payer: BC Managed Care – PPO

## 2023-01-29 DIAGNOSIS — I5022 Chronic systolic (congestive) heart failure: Secondary | ICD-10-CM | POA: Diagnosis not present

## 2023-01-29 DIAGNOSIS — Z515 Encounter for palliative care: Secondary | ICD-10-CM | POA: Diagnosis not present

## 2023-01-29 DIAGNOSIS — A419 Sepsis, unspecified organism: Secondary | ICD-10-CM | POA: Diagnosis not present

## 2023-01-29 DIAGNOSIS — I4819 Other persistent atrial fibrillation: Secondary | ICD-10-CM | POA: Diagnosis not present

## 2023-01-29 DIAGNOSIS — E8779 Other fluid overload: Secondary | ICD-10-CM

## 2023-01-29 DIAGNOSIS — E8770 Fluid overload, unspecified: Secondary | ICD-10-CM | POA: Diagnosis not present

## 2023-01-29 DIAGNOSIS — J9601 Acute respiratory failure with hypoxia: Secondary | ICD-10-CM | POA: Diagnosis not present

## 2023-01-29 LAB — POCT I-STAT, CHEM 8
BUN: 52 mg/dL — ABNORMAL HIGH (ref 6–20)
BUN: 58 mg/dL — ABNORMAL HIGH (ref 6–20)
BUN: 52 mg/dL — ABNORMAL HIGH (ref 6–20)
BUN: 59 mg/dL — ABNORMAL HIGH (ref 6–20)
BUN: 55 mg/dL — ABNORMAL HIGH (ref 6–20)
BUN: 58 mg/dL — ABNORMAL HIGH (ref 6–20)
BUN: 52 mg/dL — ABNORMAL HIGH (ref 6–20)
BUN: 58 mg/dL — ABNORMAL HIGH (ref 6–20)
BUN: 51 mg/dL — ABNORMAL HIGH (ref 6–20)
BUN: 61 mg/dL — ABNORMAL HIGH (ref 6–20)
BUN: 53 mg/dL — ABNORMAL HIGH (ref 6–20)
Calcium, Ion: 0.49 mmol/L — CL (ref 1.15–1.40)
Calcium, Ion: 1.04 mmol/L — ABNORMAL LOW (ref 1.15–1.40)
Calcium, Ion: 0.47 mmol/L — CL (ref 1.15–1.40)
Calcium, Ion: 1.07 mmol/L — ABNORMAL LOW (ref 1.15–1.40)
Calcium, Ion: 0.5 mmol/L — CL (ref 1.15–1.40)
Calcium, Ion: 1.06 mmol/L — ABNORMAL LOW (ref 1.15–1.40)
Calcium, Ion: 0.43 mmol/L — CL (ref 1.15–1.40)
Calcium, Ion: 1.03 mmol/L — ABNORMAL LOW (ref 1.15–1.40)
Calcium, Ion: 0.42 mmol/L — CL (ref 1.15–1.40)
Calcium, Ion: 1.05 mmol/L — ABNORMAL LOW (ref 1.15–1.40)
Calcium, Ion: 0.38 mmol/L — CL (ref 1.15–1.40)
Chloride: 100 mmol/L (ref 98–111)
Chloride: 107 mmol/L (ref 98–111)
Chloride: 106 mmol/L (ref 98–111)
Chloride: 99 mmol/L (ref 98–111)
Chloride: 106 mmol/L (ref 98–111)
Chloride: 95 mmol/L — ABNORMAL LOW (ref 98–111)
Chloride: 105 mmol/L (ref 98–111)
Chloride: 98 mmol/L (ref 98–111)
Chloride: 103 mmol/L (ref 98–111)
Chloride: 97 mmol/L — ABNORMAL LOW (ref 98–111)
Chloride: 97 mmol/L — ABNORMAL LOW (ref 98–111)
Creatinine, Ser: 3.1 mg/dL — ABNORMAL HIGH (ref 0.61–1.24)
Creatinine, Ser: 4.6 mg/dL — ABNORMAL HIGH (ref 0.61–1.24)
Creatinine, Ser: 3.2 mg/dL — ABNORMAL HIGH (ref 0.61–1.24)
Creatinine, Ser: 4.6 mg/dL — ABNORMAL HIGH (ref 0.61–1.24)
Creatinine, Ser: 3.1 mg/dL — ABNORMAL HIGH (ref 0.61–1.24)
Creatinine, Ser: 4.6 mg/dL — ABNORMAL HIGH (ref 0.61–1.24)
Creatinine, Ser: 3.1 mg/dL — ABNORMAL HIGH (ref 0.61–1.24)
Creatinine, Ser: 4.5 mg/dL — ABNORMAL HIGH (ref 0.61–1.24)
Creatinine, Ser: 3 mg/dL — ABNORMAL HIGH (ref 0.61–1.24)
Creatinine, Ser: 4.7 mg/dL — ABNORMAL HIGH (ref 0.61–1.24)
Creatinine, Ser: 3.2 mg/dL — ABNORMAL HIGH (ref 0.61–1.24)
Glucose, Bld: 184 mg/dL — ABNORMAL HIGH (ref 70–99)
Glucose, Bld: 205 mg/dL — ABNORMAL HIGH (ref 70–99)
Glucose, Bld: 176 mg/dL — ABNORMAL HIGH (ref 70–99)
Glucose, Bld: 197 mg/dL — ABNORMAL HIGH (ref 70–99)
Glucose, Bld: 189 mg/dL — ABNORMAL HIGH (ref 70–99)
Glucose, Bld: 203 mg/dL — ABNORMAL HIGH (ref 70–99)
Glucose, Bld: 190 mg/dL — ABNORMAL HIGH (ref 70–99)
Glucose, Bld: 218 mg/dL — ABNORMAL HIGH (ref 70–99)
Glucose, Bld: 189 mg/dL — ABNORMAL HIGH (ref 70–99)
Glucose, Bld: 213 mg/dL — ABNORMAL HIGH (ref 70–99)
Glucose, Bld: 214 mg/dL — ABNORMAL HIGH (ref 70–99)
HCT: 45 % (ref 39.0–52.0)
HCT: 53 % — ABNORMAL HIGH (ref 39.0–52.0)
HCT: 48 % (ref 39.0–52.0)
HCT: 52 % (ref 39.0–52.0)
HCT: 47 % (ref 39.0–52.0)
HCT: 53 % — ABNORMAL HIGH (ref 39.0–52.0)
HCT: 48 % (ref 39.0–52.0)
HCT: 51 % (ref 39.0–52.0)
HCT: 48 % (ref 39.0–52.0)
HCT: 52 % (ref 39.0–52.0)
HCT: 53 % — ABNORMAL HIGH (ref 39.0–52.0)
Hemoglobin: 15.3 g/dL (ref 13.0–17.0)
Hemoglobin: 18 g/dL — ABNORMAL HIGH (ref 13.0–17.0)
Hemoglobin: 16.3 g/dL (ref 13.0–17.0)
Hemoglobin: 17.7 g/dL — ABNORMAL HIGH (ref 13.0–17.0)
Hemoglobin: 16 g/dL (ref 13.0–17.0)
Hemoglobin: 18 g/dL — ABNORMAL HIGH (ref 13.0–17.0)
Hemoglobin: 16.3 g/dL (ref 13.0–17.0)
Hemoglobin: 17.3 g/dL — ABNORMAL HIGH (ref 13.0–17.0)
Hemoglobin: 16.3 g/dL (ref 13.0–17.0)
Hemoglobin: 17.7 g/dL — ABNORMAL HIGH (ref 13.0–17.0)
Hemoglobin: 18 g/dL — ABNORMAL HIGH (ref 13.0–17.0)
Potassium: 4.5 mmol/L (ref 3.5–5.1)
Potassium: 5.2 mmol/L — ABNORMAL HIGH (ref 3.5–5.1)
Potassium: 4.4 mmol/L (ref 3.5–5.1)
Potassium: 5.4 mmol/L — ABNORMAL HIGH (ref 3.5–5.1)
Potassium: 4.5 mmol/L (ref 3.5–5.1)
Potassium: 5.2 mmol/L — ABNORMAL HIGH (ref 3.5–5.1)
Potassium: 4.1 mmol/L (ref 3.5–5.1)
Potassium: 4.8 mmol/L (ref 3.5–5.1)
Potassium: 4 mmol/L (ref 3.5–5.1)
Potassium: 4.8 mmol/L (ref 3.5–5.1)
Potassium: 3.9 mmol/L (ref 3.5–5.1)
Sodium: 134 mmol/L — ABNORMAL LOW (ref 135–145)
Sodium: 137 mmol/L (ref 135–145)
Sodium: 135 mmol/L (ref 135–145)
Sodium: 137 mmol/L (ref 135–145)
Sodium: 135 mmol/L (ref 135–145)
Sodium: 143 mmol/L (ref 135–145)
Sodium: 136 mmol/L (ref 135–145)
Sodium: 138 mmol/L (ref 135–145)
Sodium: 134 mmol/L — ABNORMAL LOW (ref 135–145)
Sodium: 137 mmol/L (ref 135–145)
Sodium: 138 mmol/L (ref 135–145)
TCO2: 21 mmol/L — ABNORMAL LOW (ref 22–32)
TCO2: 24 mmol/L (ref 22–32)
TCO2: 20 mmol/L — ABNORMAL LOW (ref 22–32)
TCO2: 27 mmol/L (ref 22–32)
TCO2: 20 mmol/L — ABNORMAL LOW (ref 22–32)
TCO2: 22 mmol/L (ref 22–32)
TCO2: 21 mmol/L — ABNORMAL LOW (ref 22–32)
TCO2: 26 mmol/L (ref 22–32)
TCO2: 21 mmol/L — ABNORMAL LOW (ref 22–32)
TCO2: 27 mmol/L (ref 22–32)
TCO2: 27 mmol/L (ref 22–32)

## 2023-01-29 LAB — GLUCOSE, CAPILLARY
Glucose-Capillary: 134 mg/dL — ABNORMAL HIGH (ref 70–99)
Glucose-Capillary: 136 mg/dL — ABNORMAL HIGH (ref 70–99)
Glucose-Capillary: 179 mg/dL — ABNORMAL HIGH (ref 70–99)
Glucose-Capillary: 186 mg/dL — ABNORMAL HIGH (ref 70–99)

## 2023-01-29 LAB — RENAL FUNCTION PANEL
Albumin: 4 g/dL (ref 3.5–5.0)
Albumin: 4.2 g/dL (ref 3.5–5.0)
Anion gap: 16 — ABNORMAL HIGH (ref 5–15)
Anion gap: 25 — ABNORMAL HIGH (ref 5–15)
BUN: 54 mg/dL — ABNORMAL HIGH (ref 6–20)
BUN: 57 mg/dL — ABNORMAL HIGH (ref 6–20)
CO2: 19 mmol/L — ABNORMAL LOW (ref 22–32)
CO2: 15 mmol/L — ABNORMAL LOW (ref 22–32)
Calcium: 10.5 mg/dL — ABNORMAL HIGH (ref 8.9–10.3)
Calcium: 10.6 mg/dL — ABNORMAL HIGH (ref 8.9–10.3)
Chloride: 97 mmol/L — ABNORMAL LOW (ref 98–111)
Chloride: 93 mmol/L — ABNORMAL LOW (ref 98–111)
Creatinine, Ser: 4.01 mg/dL — ABNORMAL HIGH (ref 0.61–1.24)
Creatinine, Ser: 4.51 mg/dL — ABNORMAL HIGH (ref 0.61–1.24)
GFR, Estimated: 19 mL/min — ABNORMAL LOW (ref >60)
GFR, Estimated: 16 mL/min — ABNORMAL LOW (ref >60)
Glucose, Bld: 144 mg/dL — ABNORMAL HIGH (ref 70–99)
Glucose, Bld: 175 mg/dL — ABNORMAL HIGH (ref 70–99)
Phosphorus: 5.3 mg/dL — ABNORMAL HIGH (ref 2.5–4.6)
Phosphorus: 5.3 mg/dL — ABNORMAL HIGH (ref 2.5–4.6)
Potassium: 5.2 mmol/L — ABNORMAL HIGH (ref 3.5–5.1)
Potassium: 5.1 mmol/L (ref 3.5–5.1)
Sodium: 132 mmol/L — ABNORMAL LOW (ref 135–145)
Sodium: 133 mmol/L — ABNORMAL LOW (ref 135–145)

## 2023-01-29 LAB — CULTURE, BLOOD (ROUTINE X 2)
Culture: NO GROWTH
Special Requests: ADEQUATE

## 2023-01-29 LAB — CBC WITH DIFFERENTIAL/PLATELET
Abs Immature Granulocytes: 1.56 10*3/uL — ABNORMAL HIGH (ref 0.00–0.07)
Basophils Absolute: 0.2 10*3/uL — ABNORMAL HIGH (ref 0.0–0.1)
Basophils Relative: 1 %
Eosinophils Absolute: 0.2 10*3/uL (ref 0.0–0.5)
Eosinophils Relative: 1 %
HCT: 43.1 % (ref 39.0–52.0)
Hemoglobin: 13.5 g/dL (ref 13.0–17.0)
Immature Granulocytes: 10 %
Lymphocytes Relative: 10 %
Lymphs Abs: 1.7 10*3/uL (ref 0.7–4.0)
MCH: 31.5 pg (ref 26.0–34.0)
MCHC: 31.3 g/dL (ref 30.0–36.0)
MCV: 100.5 fL — ABNORMAL HIGH (ref 80.0–100.0)
Monocytes Absolute: 2.2 10*3/uL — ABNORMAL HIGH (ref 0.1–1.0)
Monocytes Relative: 14 %
Neutro Abs: 10.5 10*3/uL — ABNORMAL HIGH (ref 1.7–7.7)
Neutrophils Relative %: 64 %
Platelets: 372 10*3/uL (ref 150–400)
RBC: 4.29 MIL/uL (ref 4.22–5.81)
RDW: 17.4 % — ABNORMAL HIGH (ref 11.5–15.5)
WBC: 16.3 10*3/uL — ABNORMAL HIGH (ref 4.0–10.5)
nRBC: 0.4 % — ABNORMAL HIGH (ref 0.0–0.2)

## 2023-01-29 LAB — COOXEMETRY PANEL
Carboxyhemoglobin: 1.5 % (ref 0.5–1.5)
Methemoglobin: <0.7 % (ref 0.0–1.5)
O2 Saturation: 62.9 %
Total hemoglobin: 14.3 g/dL (ref 12.0–16.0)

## 2023-01-29 LAB — TRIGLYCERIDES: Triglycerides: 352 mg/dL — ABNORMAL HIGH (ref <150)

## 2023-01-29 LAB — MAGNESIUM: Magnesium: 3.1 mg/dL — ABNORMAL HIGH (ref 1.7–2.4)

## 2023-01-29 MED ORDER — SURGILUBE EX GEL: CUTANEOUS | Status: DC | PRN

## 2023-01-29 MED ORDER — AMIODARONE IV BOLUS ONLY 150 MG/100ML
150.0000 mg | Freq: Once | INTRAVENOUS | Status: AC
  Administered 2023-01-29: 150 mg via INTRAVENOUS

## 2023-01-29 MED ORDER — AMIODARONE HCL IN DEXTROSE 360-4.14 MG/200ML-% IV SOLN
INTRAVENOUS | Status: AC
  Administered 2023-01-29: 60 mg/h via INTRAVENOUS
  Filled 2023-01-29: qty 200

## 2023-01-29 MED ORDER — PRISMASOL B22GK 4/0 22-4 MEQ/L REPLACEMENT SOLN
Status: DC
  Filled 2023-01-29 (×6): qty 5000

## 2023-01-29 MED ORDER — ACD FORMULA A 0.73-2.45-2.2 GM/100ML VI SOLN
3000.0000 mL | Status: DC
  Administered 2023-01-29 (×2): 1000 mL via INTRAVENOUS_CENTRAL
  Administered 2023-01-30 – 2023-02-03 (×8): 3000 mL via INTRAVENOUS_CENTRAL
  Filled 2023-01-29: qty 3000
  Filled 2023-01-29: qty 1000
  Filled 2023-01-29 (×17): qty 3000

## 2023-01-29 MED ORDER — AMIODARONE HCL IN DEXTROSE 360-4.14 MG/200ML-% IV SOLN
60.0000 mg/h | INTRAVENOUS | Status: DC
  Administered 2023-01-30 (×2): 60 mg/h via INTRAVENOUS
  Filled 2023-01-29 (×2): qty 200

## 2023-01-29 MED ORDER — AMIODARONE HCL IN DEXTROSE 360-4.14 MG/200ML-% IV SOLN
60.0000 mg/h | INTRAVENOUS | Status: DC
  Filled 2023-01-29: qty 200

## 2023-01-29 MED ORDER — ALBUMIN HUMAN 25 % IV SOLN
12.5000 g | Freq: Once | INTRAVENOUS | Status: AC
  Administered 2023-01-29: 12.5 g via INTRAVENOUS
  Filled 2023-01-29: qty 50

## 2023-01-29 MED ORDER — AMIODARONE HCL IN DEXTROSE 360-4.14 MG/200ML-% IV SOLN
INTRAVENOUS | Status: AC
  Filled 2023-01-29: qty 200

## 2023-01-29 MED ORDER — DEXTROSE 5 % IV SOLN
20.0000 g | INTRAVENOUS | Status: DC
  Administered 2023-01-29 – 2023-01-30 (×2): 20 g via INTRAVENOUS_CENTRAL
  Filled 2023-01-29 (×2): qty 200

## 2023-01-29 NOTE — Progress Notes (Signed)
Dr Shirlee Latch notified of rhythm.  Will not decrease sedation and further per Dr Shirlee Latch

## 2023-01-29 NOTE — Progress Notes (Signed)
Patient ID: Phillip Young, male   DOB: 01/19/1986, 36 y.o.   MRN: [id number]     Advanced Heart Failure Rounding Note  PCP-Cardiologist: Kalany Diekmann, MD   Subjective:   7/4: Back in A fib RVR and underwent urgent cardioversion x3 but failed to convert. Pressor requirements increased. Vaso added and Norepi increased.  7/5: Successful DCCV to NSR.   Remains intubated, sedated. Agitated with sedation wean.   BCx + for Strep pneumoniae. Abx narrowed to ceftriaxone. Tm 100.5, WBCs 16.   On CVVH, currently pulling net negative 50 cc/hr UF. CVP 8 today.   On NE 18 + Vaso 0.04 units + midodrine 15 mg tid. MAP stable. Co-ox 63%.  He remains in NSR today.   Objective:   Weight Range: (!) 173.3 kg Body mass index is 51.82 kg/m.   Vital Signs:   Temp:  [96.9 F (36.1 C)-100.7 F (38.2 C)] 100.7 F (38.2 C) (07/05 0400) Pulse Rate:  [76-185] 163 (07/05 0645) Resp:  [23-35] 26 (07/05 0700) BP: (93-122)/(57-92) 115/87 (07/05 0700) SpO2:  [88 %-100 %] 94 % (07/05 0645) Arterial Line BP: (84-117)/(52-80) 110/66 (07/05 0700) FiO2 (%):  [30 %-60 %] 60 % (07/05 0321) Weight:  [173.3 kg] 173.3 kg (07/05 0500) Last BM Date : 01/21/23  Weight change: Filed Weights   01/26/23 0500 01/27/23 0600 01/28/23 0500  Weight: (!) 178.4 kg (!) 175 kg (!) 173.3 kg    Intake/Output:   Intake/Output Summary (Last 24 hours) at 01/28/2023 0732 Last data filed at 01/28/2023 0700 Gross per 24 hour  Intake 3216.35 ml  Output 1787.7 ml  Net 1428.65 ml      Physical Exam   General: Intubated Neck: Thick. No JVD, no thyromegaly or thyroid nodule.  Lungs: Clear to auscultation bilaterally with normal respiratory effort. CV: Nondisplaced PMI.  Heart regular S1/S2, no S3/S4, no murmur.  No peripheral edema.   Abdomen: Soft, nontender, no hepatosplenomegaly, no distention.  Skin: Intact without lesions or rashes.  Neurologic: Sedated on vent Extremities: No clubbing or cyanosis.  HEENT: Normal.     Telemetry  A fib RVR 150-160s    EKG    N/A   Labs    CBC Recent Labs    01/27/23 0318 01/28/23 0330  WBC 14.1* 16.5*  HGB 13.5 13.1  HCT 41.6 41.5  MCV 98.1 99.3  PLT 368 399   Basic Metabolic Panel Recent Labs    01/27/23 0318 01/27/23 1613 01/28/23 0330  NA 131* 130* 131*  K 5.0 5.4* 5.6*  CL 92* 96* 96*  CO2 20* 18* 21*  GLUCOSE 139* 139* 141*  BUN 40* 42* 46*  CREATININE 3.19* 3.33* 3.53*  CALCIUM 11.0* 10.4* 10.3  MG 3.2*  --  3.2*  PHOS 4.5 4.5 5.0*   Liver Function Tests Recent Labs    01/27/23 1613 01/28/23 0330  ALBUMIN 4.1 4.0   No results for input(s): "LIPASE", "AMYLASE" in the last 72 hours. Cardiac Enzymes No results for input(s): "CKTOTAL", "CKMB", "CKMBINDEX", "TROPONINI" in the last 72 hours.  BNP: BNP (last 3 results) Recent Labs    05/22/22 0732 08/05/22 1720 01/21/23 1739  BNP 373.1* 15.5 1,528.6*    ProBNP (last 3 results) No results for input(s): "PROBNP" in the last 8760 hours.   D-Dimer No results for input(s): "DDIMER" in the last 72 hours. Hemoglobin A1C No results for input(s): "HGBA1C" in the last 72 hours. Fasting Lipid Panel Recent Labs    01/28/23 0330  TRIG 302*     Thyroid Function Tests No results for input(s): "TSH", "T4TOTAL", "T3FREE", "THYROIDAB" in the last 72 hours.  Invalid input(s): "FREET3"  Other results:   Imaging    No results found.   Medications:     Scheduled Medications:  apixaban  5 mg Per Tube BID   Chlorhexidine Gluconate Cloth  6 each Topical Q0600   darbepoetin (ARANESP) injection - DIALYSIS  100 mcg Subcutaneous Q Mon-1800   docusate  100 mg Per Tube BID   feeding supplement (PROSource TF20)  60 mL Per Tube QID   fentaNYL (SUBLIMAZE) injection  50 mcg Intravenous Once   hydrOXYzine  10 mg Per Tube TID   lidocaine  1 patch Transdermal Q24H   melatonin  5 mg Per Tube QHS   midodrine  15 mg Per Tube Q8H   multivitamin  1 tablet Per Tube QHS   mouth rinse  15  mL Mouth Rinse Q2H   pantoprazole (PROTONIX) IV  40 mg Intravenous Q24H   polyethylene glycol  17 g Per Tube BID    Infusions:  sodium chloride Stopped (01/22/23 1859)   cefTRIAXone (ROCEPHIN)  IV Stopped (01/27/23 1039)   feeding supplement (VITAL 1.5 CAL) 55 mL/hr at 01/28/23 0700   fentaNYL infusion INTRAVENOUS 175 mcg/hr (01/28/23 0700)   midazolam 5 mg/hr (01/28/23 0700)   norepinephrine (LEVOPHED) Adult infusion 16 mcg/min (01/28/23 0700)   prismasol B22GK 4/0 400 mL/hr at 01/28/23 0320   prismasol B22GK 4/0 400 mL/hr at 01/28/23 0322   prismasol BGK 4/2.5 1,500 mL/hr at 01/28/23 0628   propofol (DIPRIVAN) infusion Stopped (01/27/23 1719)   vasopressin 0.03 Units/min (01/28/23 0700)    PRN Medications: sodium chloride, acetaminophen **OR** acetaminophen, bisacodyl, fentaNYL, midazolam, ondansetron (ZOFRAN) IV, mouth rinse, polyvinyl alcohol    Patient Profile   36 y.o. 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 Started on CRRT. BCx + for strep pneumo   Assessment/Plan   1.  Acute on chronic systolic CHF: Nonischemic CMP, LHC 2021 with no CAD. Previous hospitalizations w/shock in setting of rapid AFib/Flutter requiring milrinone. Most recent echo 5/24 was unchanged with EF 20-25%, RV difficult to visualize.  He signs off early from HD and was significantly volume overloaded this admission. Suspect combination cardiogenic and septic shock at this time, currently on vasopressin 0.04 units, Norepi 18 mcg and midodrine 15 tid.  Weight down significantly with aggressive UF via CVVH, CVP 8 today.  - Continue CVVH aiming for UF net 50 cc/hr.   - Will  carefully wean sedation today (has trouble with agitation), try to wean down NE, leave vasopressin at current dose.     - Continue Midodrine 15 mg tid  - Not candidate for ICD given obesity and dialysis  - Not candidate for advanced therapies.  2. Atrial flutter/fibrillation: History of multiple DCCVs. Amiodarone previously stopped due to elevated LFTs. Restarted 05/20/22 by Dr. Camnitz.  Concern for amiodarone pulmonary toxicity w/ elevated ESR and CRP 11/23. He has been off amiodarone and was treated with steroids.  AF ablation at [facility] in 12/23. He was in NSR at admission but went into atrial flutter with RVR on 7/3. Had urgent cardioversion x 3 on 7/4 but failed   to convert. Successful cardioversion on 7/5 and remains in NSR today.   - Not candidate for amiodarone (lung and hepatic toxicity) or Tikosyn (ESRD), no anti-arrhythmic option.  - Continue Eliquis 5 mg bid  - Weaning sedation is going to be a potential problem, he went into AFL with RVR with initial sedation wean (developed significant agitation when sedation was weaned).  3. History of amiodarone lung toxicity: Seen by pulmonary during admit 11/23. Had elevated ESR and sed rate. Treated with prednisone.   4. ESRD: HD per nephrology, on T,Th,Sat schedule.  Poor compliance with chronic treatment (signs off early). Significantly volume overloaded at admission. Weight down with CVVH. CVP 8 today.  - Continue CVVH, aim for net negative 50 cc/hr UF.  5. ID/Bacteremia: Fever at admission.  Suspect PNA. BCx + for Strep pneumoniae.  - abx narrowed to ceftriaxone per PCCM  6. OHS/OSA: Has been on CPAP and home oxygen.  7. Lower extremity wounds: - Per primary  - Wound consulted 8. Acute on chronic hypoxemic respiratory failure: Baseline OHS/OSA, now suspect PNA and CHF/volume overload. Intubated.  - Antibiotics as above.  - CVVH.  - As above, vent weaning is going to be difficult due to significant agitation.  Onset of AFL with RVR last  time weaning was attempted.  Will need slow sedation wean.  9. GOC: Palliative care has seen and spoken with family.  They want full code for now.  We are having ongoing discussions. If rapid atrial arrhythmias recur and unable to convert electrically, he has no good options.    CRITICAL CARE Performed by: Deyanna Mctier  Total critical care time:35 minutes  Critical care time was exclusive of separately billable procedures and treating other patients.  Critical care was necessary to treat or prevent imminent or life-threatening deterioration.  Critical care was time spent personally by me on the following activities: development of treatment plan with patient and/or surrogate as well as nursing, discussions with consultants, evaluation of patient's response to treatment, examination of patient, obtaining history from patient or surrogate, ordering and performing treatments and interventions, ordering and review of laboratory studies, ordering and review of radiographic studies, pulse oximetry and re-evaluation of patient's condition.  Lillyan Hitson 01/28/2023 8:41 AM  

## 2023-01-29 NOTE — Progress Notes (Signed)
Dr Arrie Aran notified of renal function panel, heart rhythm, low BP, and CVP.  Orders received.

## 2023-01-29 NOTE — Progress Notes (Addendum)
Daily Progress Note   Patient Name: Phillip Young       Date: 01/29/2023 DOB: [birth year]  Age: 37 y.o. MRN#: [id number] Attending Physician: Cheri Fowler, MD Primary Care Physician: Marrianne Mood, MD Admit Date: 01/21/2023  Reason for Consultation/Follow-up: Establishing goals of care  Subjective: I have reviewed medical records including EPIC notes, MAR, and labs. Patient remains intubated, sedated, and on pressor support. Received report from primary RN - no acute concerns. RN reports sedation was decreased - patient now in and out of bigeminy.   Went to visit patient at bedside -mother/Phillip Young and uncle/Phillip Young present.  Patient was lying in bed intubated and sedated. Tube feeds continued. No signs or non-verbal gestures of pain or discomfort noted, though he is ill appearing. No respiratory distress, increased work of breathing, or secretions noted. Patient spontaneously lifts his left arm once during my visit.  Therapeutic listening and emotional support provided to family as they reflect in detail what they felt were concern's with nursing staff and security yesterday.  Service recovery completed.  Offered number to patient experience, which they accepted. Notified charge RN family would prefer not to work with day shift nurse from yesterday. Family are happy with nursing care today.   Very gentle discussions throughout conversation today that patient remains very frail, ill, and with poor prognosis. After interaction with RN and security yesterday, family feel very strongly about continuing full code/full scope care.  No changes to goals today. Family express appreciation for PMT support.  All questions and concerns addressed. Encouraged to call with questions and/or concerns. PMT card  previously provided.  Reviewed case and family concerns with primary RN - reviewed discussion strategies RN's can utilize to support family while also being truthful about patient's clinical status and decisions regarding titrating medications.   Length of Stay: 8  Current Medications: Scheduled Meds:   apixaban  5 mg Per Tube BID   Chlorhexidine Gluconate Cloth  6 each Topical Q0600   darbepoetin (ARANESP) injection - DIALYSIS  100 mcg Subcutaneous Q Mon-1800   docusate  100 mg Per Tube BID   feeding supplement (PROSource TF20)  60 mL Per Tube QID   fentaNYL (SUBLIMAZE) injection  50 mcg Intravenous Once   hydrOXYzine  10 mg Per Tube TID   lidocaine  1 patch Transdermal Q24H   melatonin  5 mg Per Tube QHS   midodrine  15 mg Per Tube Q8H   multivitamin  1 tablet Per Tube QHS   mouth rinse  15 mL Mouth Rinse Q2H   pantoprazole (PROTONIX) IV  40 mg Intravenous Q24H   polyethylene glycol  17 g Per Tube BID   senna-docusate  1 tablet Per Tube BID    Continuous Infusions:  sodium chloride Stopped (01/22/23 1859)   cefTRIAXone (ROCEPHIN)  IV 2 g (01/29/23 1008)   feeding supplement (VITAL 1.5 CAL) 55 mL/hr at 01/29/23 1000   fentaNYL infusion INTRAVENOUS 100 mcg/hr (01/29/23 1000)   midazolam 4 mg/hr (01/29/23 1000)   norepinephrine (LEVOPHED) Adult infusion 12 mcg/min (01/29/23 1000)   prismasol B22GK 4/0 400 mL/hr at 01/29/23 0239   prismasol B22GK 4/0 400 mL/hr at 01/29/23 0238   prismasol BGK 2/2.5 dialysis solution 1,500 mL/hr at 01/29/23 0351   propofol (DIPRIVAN) infusion Stopped (01/27/23 1719)   vasopressin 0.04 Units/min (01/29/23 1000)    PRN Meds: sodium chloride, acetaminophen **OR** acetaminophen, bisacodyl, fentaNYL, midazolam, ondansetron (ZOFRAN) IV, mouth rinse, polyvinyl alcohol, surgical lubricant  Physical Exam Vitals and nursing note reviewed.  Constitutional:      General: He is not in acute distress.    Appearance: He is ill-appearing.      Interventions: He is sedated and intubated.  Pulmonary:     Effort: No respiratory distress. He is intubated.  Skin:    General: Skin is warm and dry.  Neurological:     Mental Status: He is unresponsive.             Vital Signs: BP 96/70   Pulse (!) 57   Temp (!) 97.2 F (36.2 C) (Axillary)   Resp (!) 26   Ht 6' (1.829 m)   Wt (!) 174.4 kg   SpO2 100%   BMI 52.15 kg/m  SpO2: SpO2: 100 % O2 Device: O2 Device: Ventilator O2 Flow Rate: O2 Flow Rate (L/min): 13 L/min  Intake/output summary:  Intake/Output Summary (Last 24 hours) at 01/29/2023 1057 Last data filed at 01/29/2023 1000 Gross per 24 hour  Intake 2528.53 ml  Output 3531.8 ml  Net -1003.27 ml   LBM: Last BM Date : 01/21/23 Baseline Weight: Weight: (!) 182.1 kg Most recent weight: Weight: (!) 174.4 kg       Palliative Assessment/Data: PPS 30% with tube feeds      Patient Active Problem List   Diagnosis Date Noted   Pressure injury of skin 01/23/2023   Acute hypoxic respiratory failure ([name]) 01/21/2023   A-fib ([name]) 01/21/2023   Acute on chronic hypoxic respiratory failure ([name]) 08/07/2022   Decompensated heart failure ([name]) 08/05/2022   Elevated liver enzymes 06/08/2022   Multiple nodules of lung 05/31/2022   Sepsis ([name])    Advanced care planning/counseling discussion    Recurrent infections 09/30/2021   Reactive depression 09/02/2021   Hypervolemia 08/18/2021   Type 2 diabetes mellitus with diabetic chronic kidney disease ([name]) 06/26/2021   Medical non-compliance 04/21/2021   Hypotension 04/21/2021   ESRD (end stage renal disease) on dialysis ([name]) 04/16/2021   Paroxysmal atrial flutter ([name]) 04/12/2021   Chronic respiratory failure with hypoxia ([name]) 02/11/2021   Chronic systolic CHF (congestive heart failure) ([name])    Chronic cough    Morbid obesity ([name]) 02/26/2020   GERD without esophagitis 02/26/2020   OSA (obstructive sleep apnea) 02/26/2020   Hidradenitis suppurativa 02/26/2020     Palliative Care Assessment & Plan   Patient Profile: 37  y.o. male with past medical history of AF, HFrEF (EF 20-25%), severe obesity, ESRD on HD, chronic hypoxic respiratory failure, OSA presented to ED on 01/22/23 from home with severe dyspnea - poor adherence to outpatient HD and fluid restriction. Patient was admitted on 01/21/2023 with acute on chronic hypoxic respiratory failure, hypervolemic, sepsis/septic shock, afib with RVR, chest pain, hematuria. CRRT was started 01/22/23. Cardiology was consulted: he is not a candidate for advanced therapies or ICD due to frailty, obesity, and dialysis. Cardioversion was attempted x3 on 01/27/23 without success. Options are very limited. If NSR cannot be restored, family will need to consider comfort measures. PMT was consulted to assist family with GOC.   Assessment: Principal Problem:   Acute hypoxic respiratory failure ([name]) Active Problems:   Morbid obesity ([name])   Chronic systolic CHF (congestive heart failure) ([name])   ESRD (end stage renal disease) on dialysis ([name])   Hypervolemia   Sepsis ([name])   A-fib ([name])   Pressure injury of skin   Concern about end of life  Recommendations/Plan: Continue full code/full scope Ongoing GOC pending clinical course Comfort measures would be very reasonable if/when family agreeable PMT will continue to follow and support holistically  Goals of Care and Additional Recommendations: Limitations on Scope of Treatment: Full Scope Treatment  Code Status:    Code Status Orders  (From admission, onward)           Start     Ordered   01/21/23 2051  Full code  Continuous       Question:  By:  Answer:  Consent: discussion documented in EHR   01/21/23 2058           Code Status History     Date Active Date Inactive Code Status Order ID Comments User Context   08/05/2022 2158 08/13/2022 1951 Full Code [id number]  Doran Stabler, DO ED   05/22/2022 1545 06/10/2022 2021 Full Code [id number]  [name] Mann, DO ED   03/26/2022 0830 03/28/2022 2024 Full Code [id number]  Marolyn Haller, MD ED   09/02/2021 1646 09/04/2021 1906 Full Code [id number]  Marolyn Haller, MD ED   08/18/2021 2033 08/23/2021 0004 Full Code [id number]  Merrilyn Puma, MD ED   04/12/2021 2154 05/20/2021 0156 Full Code [id number]  Hillary Bow, DO ED   02/11/2021 1849 03/26/2021 1740 Full Code [id number]  Orland Mustard, MD ED   10/08/2020 1424 10/16/2020 2059 Full Code [id number]  Claudean Severance, MD ED   02/26/2020 2340 03/11/2020 2330 Full Code [id number]  Shalhoub, Deno Lunger, MD ED       Prognosis:  Poor  Discharge Planning: To Be Determined  Care plan was discussed with primary RN, patient's family  Thank you for allowing the Palliative Medicine Team to assist in the care of this patient.   Total Time 50 minutes Prolonged Time Billed  no       Greater than 50%  of this time was spent counseling and coordinating care related to the above assessment and plan.  Haskel Khan, NP  Please contact Palliative Medicine Team phone at [phone] for questions and concerns.   *Portions of this note are a verbal dictation therefore any spelling and/or grammatical errors are due to the "[employer]" system interpretation.

## 2023-01-29 NOTE — Progress Notes (Signed)
eLink Physician-Brief Progress Note Patient Name: Phillip Young DOB: 1986/02/18 MRN: [id number]   Date of Service  01/29/2023  HPI/Events of Note  Called for SVT with HR 175. Hypotensive while on 2 pressors. Noted prior history of multiple DCCV. Asked ground team to assess. Cardiology had already been called by RN as well.   eICU Interventions  Bedside teams performed dccv x 3 and has ordered IV amio. Seen by CCM ground team as well as cardiology. Really not much available in terms of meds to offer for the arrhythmia.      Intervention Category Major Interventions: Arrhythmia - evaluation and management  Mikailah Morel G Elener Custodio 01/29/2023, 11:19 PM  Addendum at 3:05 am - called for uncontrolled HR. HR 176. Levophed now at 27 mic/min, remains on fixed dose of vasopressin. Will stop pulling on CRRT entirely. Not tolerating the A fib but DCCV x 3 did not work earlier either. D/w Dr Everardo All who had seen the patient earlier as well. Really not much to offer in terms of additional meds . Will try and re bolus with amiodarone and see if that helps. Will also replete calcium. RN is also going to reach out to cardiology to see if we can try anything else at all. Very guarded prognosis.   Addendum at 3:45 am - Notified by RN that cardiology had performed another cardioversion and that did not work either. Continue amiodarone.   Addendum at 6:58 am - RN had also called patient's mother and updated her of his condition. Mother is aware he is severely ill. Cardiology note also reviewed. Day team to follow.

## 2023-01-29 NOTE — Progress Notes (Signed)
NAME:  Grae Niska, MRN:  [id number], DOB:  [birth year], LOS: 8 ADMISSION DATE:  01/21/2023, CONSULTATION DATE:  01/22/23 REFERRING MD:  Danise Edge, CHIEF COMPLAINT:  SOB   History of Present Illness:  37 year old man well known to the PCCM and AHF services who presented to [facility] 6/28 with fevers, joint pains, SOB. PMHx significant for ESRD, NICM, refractory AFib, ?amio-induced lung toxicity. Patient reportedly missed HD on Thursday (has a history of leaving HD early). Reportedly ~10kg over dry weight with worsening breathing status, AFib/RVR and borderline BP.  PCCM consulted for ICU admission/further evaluation and management.  Pertinent Medical History:   Past Medical History:  Diagnosis Date   Acute on chronic respiratory failure with hypoxia ([name]) 04/21/2021   Acute on chronic systolic (congestive) heart failure ([name]) 02/26/2020   Amiodarone toxicity    Anemia    Atrial flutter ([name])    Biventricular congestive heart failure ([name])    Chronic hypoxemic respiratory failure ([name])    Class 3 severe obesity due to excess calories with serious comorbidity and body mass index (BMI) of 50.0 to 59.9 in adult [facility]) 02/26/2020   Essential hypertension 02/26/2020   GERD without esophagitis 02/26/2020   Hidradenitis suppurativa 02/26/2020   NICM (nonischemic cardiomyopathy) ([name])    Obesity hypoventilation syndrome ([name])    OSA (obstructive sleep apnea)    PAF (paroxysmal atrial fibrillation) ([name])    Pneumonia    Prediabetes 02/26/2020   Significant Hospital Events: Including procedures, antibiotic start and stop dates in addition to other pertinent events   6/28 Admit, emergent HD 6/29 Afib/RVR, shock, ICU transfer 6/30 Intubated and placed on vasopressor support and started on CRRT 7/1 Remains intubated on CRRT continue with pressor support 7/2 Agitated with SAT, remains on CRRT and low dose levophed 7/3 low dose NE, ongoing CRRT 7/4 Attempted SBT this am, apneic but severely agitated,  desaturated, HR up 130/140s.  Re-sedated but HR remains 140's, NE up from 6 to . NSR on EKG.  Was pulling 50-100on UF, since kept even for now.  CVP 8 7/5 DCCV again attempted this a.m. with success, remains on norepinephrine and vasopressin.   Interim History / Subjective:  Underwent successful DCCV yesterday Remains on vasopressor support with Levophed and vasopressin Going in and out of bigeminy Remained afebrile  Objective:  Blood pressure 96/70, pulse (!) 103, temperature (!) 97.2 F (36.2 C), temperature source Axillary, resp. rate (!) 24, height 6' (1.829 m), weight (!) 174.4 kg, SpO2 100 %. CVP:  [2 mmHg-9 mmHg] 5 mmHg  Vent Mode: PRVC FiO2 (%):  [40 %] 40 % Set Rate:  [24 bmp] 24 bmp Vt Set:  [610 mL-620 mL] 610 mL PEEP:  [5 cmH20] 5 cmH20 Plateau Pressure:  [20 cmH20-25 cmH20] 24 cmH20   Intake/Output Summary (Last 24 hours) at 01/29/2023 0912 Last data filed at 01/29/2023 0900 Gross per 24 hour  Intake 2692.4 ml  Output 3624.8 ml  Net -932.4 ml   Filed Weights   01/27/23 0600 01/28/23 0500 01/29/23 0500  Weight: (!) 175 kg (!) 173.3 kg (!) 174.4 kg   Physical Examination: General: Crtitically ill-appearing morbidly obese male, orally intubated HEENT: [place]/AT, eyes anicteric.  ETT and OGT in place Neuro: Eyes closed, does not open, not following commands, withdrawing in all 4 extremities Chest: Reduced air entry at the bases bilaterally no wheezes or rhonchi Heart: Regular rate and rhythm, no murmurs or gallops Abdomen: Soft, nondistended, bowel sounds present Skin: No rash  Labs and images were reviewed  Resolved Hospital Problem List:  N/A  Assessment & Plan:  Acute-on-chronic hypoxemic respiratory failure Bilateral multifocal pneumonia Pulmonary edema due to noncompliance with hemodialysis OHS/OSA, noncompliant with CPAP Continue lung protective ventilation VAP prevention bundle in place PAD protocol with fentanyl and Versed with RASS goal -1 Peak and  plateau pressure at goal Continue IV antibiotics with ceftriaxone  Septic shock due to strep pneumo bacteremia, POA BCx grew strep pneumo, likely secondary to bilateral lower lobe pneumonia. Remains on pressor support with MAP goal greater than 65 Continue midodrine Continue ceftriaxone to complete 14 days therapy  Acute on chronic HFrEF Nonischemic cardiomyopathy Echocardiogram 5/24 with unchanged EF of 20 to 25% Paroxysmal rapid A-fib/flutter with RVR History of multiple DCCVs with history of amiodarone toxicity treated with steroids S/p DCCV yesterday Monitor intake and output Advanced heart failure team is following Continue Eliquis for stroke prophylaxis  End-stage renal disease on hemodialysis Hypervolemic hyponatremia Hyperphosphatemia Hyperkalemia  High anion gap metabolic acidosis Hypercalcemia Patient is noncompliant with hemodialysis Nephrology following Continue CRRT Closely monitor and supplement electrolytes  Acute septic encephalopathy Sedation was decreased this morning, closely monitor, RASS goal -1  Hematuria POA, resolved No hydronephrosis, medical renal disease seen on renal ultrasound. Possible traumatic Foley insertion  Morbid obesity Dietitian is following Continue tube feed  Hypertriglyceridemia Propofol was stopped Closely monitor Best Practice (right click and "Reselect all SmartList Selections" daily)   Diet/type: tube feeds DVT prophylaxis: DOAC GI prophylaxis: Protonix Lines: Yes still needed Foley: n/a Code Status:  full code Last date of multidisciplinary goals of care: 7/5: Patient mother was updated at bedside, decision was to continue full scope of care.  Palliative care team is following   The patient is critically ill due to acute on chronic respiratory failure, septic shock.  Critical care was necessary to treat or prevent imminent or life-threatening deterioration.  Critical care was time spent personally by me on the  following activities: development of treatment plan with patient and/or surrogate as well as nursing, discussions with consultants, evaluation of patient's response to treatment, examination of patient, obtaining history from patient or surrogate, ordering and performing treatments and interventions, ordering and review of laboratory studies, ordering and review of radiographic studies, pulse oximetry, re-evaluation of patient's condition and participation in multidisciplinary rounds.   During this encounter critical care time was devoted to patient care services described in this note for 38 minutes.     Cheri Fowler, MD [facility] See Amion for pager If no response to pager, please call [phone] until 7pm After 7pm, Please call E-link [phone]

## 2023-01-29 NOTE — Progress Notes (Signed)
PCCM interval progress note:  Pt evaluated with Dr. Everardo All for aflutter rates 180-200, initially worsened hypotension that stabilized on levophed and vaso.  Attempted cardioversion three times without improvement.  Cardiology also at the bedside.  Though has a hx of amiodarone toxicity it was felt that the best option was to give an amiodarone bolus and start gtt.     Darcella Gasman Nika Yazzie, PA-C [facility] See Amion for pager If no response to pager , please call [phone] until 7pm After 7:00 pm call Elink  [phone]

## 2023-01-29 NOTE — Progress Notes (Addendum)
[name] NEPHROLOGY PROGRESS NOTE  Assessment/ Plan: Pt is a 37 y.o. yo male chronic CHF, PAF, morbid obesity, OSA, ESRD on HD with pneumonia and CHF exacerbation.  OP HD orders: [facility], TTS. 4hrs 15 min. F180. Flow rates: 425/autoflow 1.5. 3k 2.5cal. EDW 180kg. Access: TDC. Meds: Mircers 200 mcg every two weeks (last dose 6/13), sensipar 60mg  three times per week, hectorol three times per week . Historically noncompliant with HD I.e. doesn't stay for full prescribed treatment times, high IDWG/does not adhere to fluid restriction   # ESRD: Required emergent HD on 6/29 because of fluid overload.  He is on Levophed and Vaso for hypotension/shock.  Started CRRT on 6/29 with initial UF goal 100-150 cc an hour as tolerated by BP -> currently at 69m/hr with CVP of 7.    He developed A-fib with RVR -> CV 7/5.  May be able to wean off CRRT in the next 24-48hrs and assess intermittent HD daily; CVP 7-8. For now will continue through Sunday and then can reassess if HD is a possibility; ideally would like to get off pressors or at least minimal pressors to attempt challenge with iHD. Filter clotting has been an issue overnight; problem is he has a h/o HIT and hypercalcemia which would make citrate protocol challenging with the Ca replacement. Worst case scenario if clotting persists will trial citrate and monitor the Ca closely.  All 4K bath.  Changed to low calcium bath because of hypercalcemia.  Changed dialysate to 2K on 7/5 -> K5.2 (cont 2K).   Monitor lab.  Discussed with ICU nurse.  # Acute on chronic systolic CHF, nonischemic: EF 16-10%.  Optimize volume with CRRT.  Discussed with the cardiology team.  Not a candidate for ICD and advanced therapies for CHF because of obesity and dialysis.  # Sepsis/septic shock: Currently on broad-spectrum antibiotics, per primary team.  Levophed as a pressor.    # Anemia of CKD: Hemoglobin at goal.  Received Aranesp on 7/1.  # Hypercalcemia:  Changed to 0 calcium and both pre and post filter.  Continue 2.5 calcium in dialysate.  Monitor lab.  # A-fib with RVR with hypotension: S/P cardioversion 7/5 -> NSR.   Subjective: Seen and examined in ICU.  On Levo and Vaso, CVP 7; agitated requiring sedation.   . Objective Vital signs in last 24 hours: Vitals:   01/29/23 0600 01/29/23 0700 01/29/23 0729 01/29/23 0809  BP: 96/70     Pulse: (!) 109 (!) 58  (!) 103  Resp: (!) 26 (!) 24  (!) 24  Temp:   (!) 97.2 F (36.2 C)   TempSrc:   Axillary   SpO2: (!) 76% 100%  100%  Weight:      Height:       Weight change: 1.1 kg  Intake/Output Summary (Last 24 hours) at 01/29/2023 0850 Last data filed at 01/29/2023 0800 Gross per 24 hour  Intake 2805.95 ml  Output 3592.8 ml  Net -786.85 ml       Labs: RENAL PANEL Recent Labs  Lab 01/25/23 0335 01/25/23 1611 01/26/23 0351 01/26/23 0352 01/27/23 0318 01/27/23 1613 01/28/23 0330 01/28/23 1600 01/29/23 0359  NA 133*   < >  --    < > 131* 130* 131* 133* 132*  K 3.5   < >  --    < > 5.0 5.4* 5.6* 5.4* 5.2*  CL 95*   < >  --    < > 92* 96* 96* 100 97*  CO2 23   < >  --    < > 20* 18* 21* 18* 19*  GLUCOSE 117*   < >  --    < > 139* 139* 141* 148* 144*  BUN 29*   < >  --    < > 40* 42* 46* 55* 54*  CREATININE 3.60*   < >  --    < > 3.19* 3.33* 3.53* 4.06* 4.01*  CALCIUM 9.1   < >  --    < > 11.0* 10.4* 10.3 10.4* 10.5*  MG 2.8*  --  2.7*  --  3.2*  --  3.2*  --  3.1*  PHOS 3.7   < >  --    < > 4.5 4.5 5.0* 5.3* 5.3*  ALBUMIN 3.2*   < >  --    < > 4.3 4.1 4.0 4.0 4.0   < > = values in this interval not displayed.    Liver Function Tests: Recent Labs  Lab 01/28/23 0330 01/28/23 1600 01/29/23 0359  ALBUMIN 4.0 4.0 4.0   No results for input(s): "LIPASE", "AMYLASE" in the last 168 hours. No results for input(s): "AMMONIA" in the last 168 hours. CBC: Recent Labs    03/26/22 1958 03/27/22 0300 06/04/22 1924 06/05/22 0154 08/09/22 0042 08/10/22 0107 01/25/23 0335  01/26/23 0351 01/27/23 0318 01/28/23 0330 01/29/23 0359  HGB  --    < >  --    < > 6.7*   < > 10.0* 10.7* 13.5 13.1 13.5  MCV  --    < >  --    < > 106.1*   < > 98.7 98.2 98.1 99.3 100.5*  VITAMINB12 297  --  5,998*  --   --   --   --   --   --   --   --   FOLATE  --   --  15.7  --   --   --   --   --   --   --   --   FERRITIN 370*  --   --   --  156  --   --   --   --   --   --   TIBC 321  --   --   --  330  --   --   --   --   --   --   IRON 27*  --   --   --  28*  --   --   --   --   --   --    < > = values in this interval not displayed.    Cardiac Enzymes: No results for input(s): "CKTOTAL", "CKMB", "CKMBINDEX", "TROPONINI" in the last 168 hours. CBG: Recent Labs  Lab 01/28/23 1049 01/28/23 1544 01/28/23 1958 01/28/23 2350 01/29/23 0412  GLUCAP 157* 170* 160* 157* 136*    Iron Studies: No results for input(s): "IRON", "TIBC", "TRANSFERRIN", "FERRITIN" in the last 72 hours. Studies/Results: No results found.  Medications: Infusions:  sodium chloride Stopped (01/22/23 1859)   cefTRIAXone (ROCEPHIN)  IV Stopped (01/28/23 1036)   feeding supplement (VITAL 1.5 CAL) Stopped (01/29/23 0831)   fentaNYL infusion INTRAVENOUS 100 mcg/hr (01/29/23 0800)   midazolam 4 mg/hr (01/29/23 0800)   norepinephrine (LEVOPHED) Adult infusion 18 mcg/min (01/29/23 0800)   prismasol B22GK 4/0 400 mL/hr at 01/29/23 0239   prismasol B22GK 4/0 400 mL/hr at 01/29/23 0238   prismasol BGK 2/2.5 dialysis solution 1,500 mL/hr  at 01/29/23 0351   propofol (DIPRIVAN) infusion Stopped (01/27/23 1719)   vasopressin 0.04 Units/min (01/29/23 0800)    Scheduled Medications:  apixaban  5 mg Per Tube BID   Chlorhexidine Gluconate Cloth  6 each Topical Q0600   darbepoetin (ARANESP) injection - DIALYSIS  100 mcg Subcutaneous Q Mon-1800   docusate  100 mg Per Tube BID   feeding supplement (PROSource TF20)  60 mL Per Tube QID   fentaNYL (SUBLIMAZE) injection  50 mcg Intravenous Once   hydrOXYzine  10 mg  Per Tube TID   lidocaine  1 patch Transdermal Q24H   melatonin  5 mg Per Tube QHS   midodrine  15 mg Per Tube Q8H   multivitamin  1 tablet Per Tube QHS   mouth rinse  15 mL Mouth Rinse Q2H   pantoprazole (PROTONIX) IV  40 mg Intravenous Q24H   polyethylene glycol  17 g Per Tube BID   senna-docusate  1 tablet Per Tube BID    have reviewed scheduled and prn medications.  Physical Exam: General: Ill looking male, intubated, sedated, CVP around 8. Heart: A-fib with RVR, s1s2 nl Lungs: Coarse breath sound bilateral Abdomen:soft, Non-tender, non-distended Extremities: Bilateral leg edema presents Dialysis Access: RIJ TDC in place.  Site clean.  Phillip Young 01/29/2023,8:50 AM  LOS: 8 days

## 2023-01-29 NOTE — H&P (View-Only) (Signed)
Patient ID: Phillip Young, male   DOB: 1986-07-05, 37 y.o.   MRN: [id number]     Advanced Heart Failure Rounding Note  PCP-Cardiologist: Marca Ancona, MD   Subjective:   7/4: Back in A fib RVR and underwent urgent cardioversion x3 but failed to convert. Pressor requirements increased. Vaso added and Norepi increased.  7/5: Successful DCCV to NSR.   Remains intubated, sedated. Agitated with sedation wean.   BCx + for Strep pneumoniae. Abx narrowed to ceftriaxone. Tm 100.5, WBCs 16.   On CVVH, currently pulling net negative 50 cc/hr UF. CVP 8 today.   On NE 18 + Vaso 0.04 units + midodrine 15 mg tid. MAP stable. Co-ox 63%.  He remains in NSR today.   Objective:   Weight Range: (!) 173.3 kg Body mass index is 51.82 kg/m.   Vital Signs:   Temp:  [96.9 F (36.1 C)-100.7 F (38.2 C)] 100.7 F (38.2 C) (07/05 0400) Pulse Rate:  [76-185] 163 (07/05 0645) Resp:  [23-35] 26 (07/05 0700) BP: (93-122)/(57-92) 115/87 (07/05 0700) SpO2:  [88 %-100 %] 94 % (07/05 0645) Arterial Line BP: (84-117)/(52-80) 110/66 (07/05 0700) FiO2 (%):  [30 %-60 %] 60 % (07/05 0321) Weight:  [173.3 kg] 173.3 kg (07/05 0500) Last BM Date : 01/21/23  Weight change: Filed Weights   01/26/23 0500 01/27/23 0600 01/28/23 0500  Weight: (!) 178.4 kg (!) 175 kg (!) 173.3 kg    Intake/Output:   Intake/Output Summary (Last 24 hours) at 01/28/2023 0732 Last data filed at 01/28/2023 0700 Gross per 24 hour  Intake 3216.35 ml  Output 1787.7 ml  Net 1428.65 ml      Physical Exam   General: Intubated Neck: Thick. No JVD, no thyromegaly or thyroid nodule.  Lungs: Clear to auscultation bilaterally with normal respiratory effort. CV: Nondisplaced PMI.  Heart regular S1/S2, no S3/S4, no murmur.  No peripheral edema.   Abdomen: Soft, nontender, no hepatosplenomegaly, no distention.  Skin: Intact without lesions or rashes.  Neurologic: Sedated on vent Extremities: No clubbing or cyanosis.  HEENT: Normal.     Telemetry  A fib RVR 150-160s    EKG    N/A   Labs    CBC Recent Labs    01/27/23 0318 01/28/23 0330  WBC 14.1* 16.5*  HGB 13.5 13.1  HCT 41.6 41.5  MCV 98.1 99.3  PLT 368 399   Basic Metabolic Panel Recent Labs    78/29/56 0318 01/27/23 1613 01/28/23 0330  NA 131* 130* 131*  K 5.0 5.4* 5.6*  CL 92* 96* 96*  CO2 20* 18* 21*  GLUCOSE 139* 139* 141*  BUN 40* 42* 46*  CREATININE 3.19* 3.33* 3.53*  CALCIUM 11.0* 10.4* 10.3  MG 3.2*  --  3.2*  PHOS 4.5 4.5 5.0*   Liver Function Tests Recent Labs    01/27/23 1613 01/28/23 0330  ALBUMIN 4.1 4.0   No results for input(s): "LIPASE", "AMYLASE" in the last 72 hours. Cardiac Enzymes No results for input(s): "CKTOTAL", "CKMB", "CKMBINDEX", "TROPONINI" in the last 72 hours.  BNP: BNP (last 3 results) Recent Labs    05/22/22 0732 08/05/22 1720 01/21/23 1739  BNP 373.1* 15.5 1,528.6*    ProBNP (last 3 results) No results for input(s): "PROBNP" in the last 8760 hours.   D-Dimer No results for input(s): "DDIMER" in the last 72 hours. Hemoglobin A1C No results for input(s): "HGBA1C" in the last 72 hours. Fasting Lipid Panel Recent Labs    01/28/23 0330  TRIG 302*  Thyroid Function Tests No results for input(s): "TSH", "T4TOTAL", "T3FREE", "THYROIDAB" in the last 72 hours.  Invalid input(s): "FREET3"  Other results:   Imaging    No results found.   Medications:     Scheduled Medications:  apixaban  5 mg Per Tube BID   Chlorhexidine Gluconate Cloth  6 each Topical Q0600   darbepoetin (ARANESP) injection - DIALYSIS  100 mcg Subcutaneous Q Mon-1800   docusate  100 mg Per Tube BID   feeding supplement (PROSource TF20)  60 mL Per Tube QID   fentaNYL (SUBLIMAZE) injection  50 mcg Intravenous Once   hydrOXYzine  10 mg Per Tube TID   lidocaine  1 patch Transdermal Q24H   melatonin  5 mg Per Tube QHS   midodrine  15 mg Per Tube Q8H   multivitamin  1 tablet Per Tube QHS   mouth rinse  15  mL Mouth Rinse Q2H   pantoprazole (PROTONIX) IV  40 mg Intravenous Q24H   polyethylene glycol  17 g Per Tube BID    Infusions:  sodium chloride Stopped (01/22/23 1859)   cefTRIAXone (ROCEPHIN)  IV Stopped (01/27/23 1039)   feeding supplement (VITAL 1.5 CAL) 55 mL/hr at 01/28/23 0700   fentaNYL infusion INTRAVENOUS 175 mcg/hr (01/28/23 0700)   midazolam 5 mg/hr (01/28/23 0700)   norepinephrine (LEVOPHED) Adult infusion 16 mcg/min (01/28/23 0700)   prismasol B22GK 4/0 400 mL/hr at 01/28/23 0320   prismasol B22GK 4/0 400 mL/hr at 01/28/23 0322   prismasol BGK 4/2.5 1,500 mL/hr at 01/28/23 0865   propofol (DIPRIVAN) infusion Stopped (01/27/23 1719)   vasopressin 0.03 Units/min (01/28/23 0700)    PRN Medications: sodium chloride, acetaminophen **OR** acetaminophen, bisacodyl, fentaNYL, midazolam, ondansetron (ZOFRAN) IV, mouth rinse, polyvinyl alcohol    Patient Profile   37 y.o. 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 Started on CRRT. BCx + for strep pneumo   Assessment/Plan   1.  Acute on chronic systolic CHF: Nonischemic CMP, LHC 2021 with no CAD. Previous hospitalizations w/shock in setting of rapid AFib/Flutter requiring milrinone. Most recent echo 5/24 was unchanged with EF 20-25%, RV difficult to visualize.  He signs off early from HD and was significantly volume overloaded this admission. Suspect combination cardiogenic and septic shock at this time, currently on vasopressin 0.04 units, Norepi 18 mcg and midodrine 15 tid.  Weight down significantly with aggressive UF via CVVH, CVP 8 today.  - Continue CVVH aiming for UF net 50 cc/hr.   - Will  carefully wean sedation today (has trouble with agitation), try to wean down NE, leave vasopressin at current dose.     - Continue Midodrine 15 mg tid  - Not candidate for ICD given obesity and dialysis  - Not candidate for advanced therapies.  2. Atrial flutter/fibrillation: History of multiple DCCVs. Amiodarone previously stopped due to elevated LFTs. Restarted 05/20/22 by Dr. Elberta Fortis.  Concern for amiodarone pulmonary toxicity w/ elevated ESR and CRP 11/23. He has been off amiodarone and was treated with steroids.  AF ablation at [facility] in 12/23. He was in NSR at admission but went into atrial flutter with RVR on 7/3. Had urgent cardioversion x 3 on 7/4 but failed  to convert. Successful cardioversion on 7/5 and remains in NSR today.   - Not candidate for amiodarone (lung and hepatic toxicity) or Tikosyn (ESRD), no anti-arrhythmic option.  - Continue Eliquis 5 mg bid  - Weaning sedation is going to be a potential problem, he went into AFL with RVR with initial sedation wean (developed significant agitation when sedation was weaned).  3. History of amiodarone lung toxicity: Seen by pulmonary during admit 11/23. Had elevated ESR and sed rate. Treated with prednisone.   4. ESRD: HD per nephrology, on T,Th,Sat schedule.  Poor compliance with chronic treatment (signs off early). Significantly volume overloaded at admission. Weight down with CVVH. CVP 8 today.  - Continue CVVH, aim for net negative 50 cc/hr UF.  5. ID/Bacteremia: Fever at admission.  Suspect PNA. BCx + for Strep pneumoniae.  - abx narrowed to ceftriaxone per PCCM  6. OHS/OSA: Has been on CPAP and home oxygen.  7. Lower extremity wounds: - Per primary  - Wound consulted 8. Acute on chronic hypoxemic respiratory failure: Baseline OHS/OSA, now suspect PNA and CHF/volume overload. Intubated.  - Antibiotics as above.  - CVVH.  - As above, vent weaning is going to be difficult due to significant agitation.  Onset of AFL with RVR last  time weaning was attempted.  Will need slow sedation wean.  9. GOC: Palliative care has seen and spoken with family.  They want full code for now.  We are having ongoing discussions. If rapid atrial arrhythmias recur and unable to convert electrically, he has no good options.    CRITICAL CARE Performed by: Marca Ancona  Total critical care time:35 minutes  Critical care time was exclusive of separately billable procedures and treating other patients.  Critical care was necessary to treat or prevent imminent or life-threatening deterioration.  Critical care was time spent personally by me on the following activities: development of treatment plan with patient and/or surrogate as well as nursing, discussions with consultants, evaluation of patient's response to treatment, examination of patient, obtaining history from patient or surrogate, ordering and performing treatments and interventions, ordering and review of laboratory studies, ordering and review of radiographic studies, pulse oximetry and re-evaluation of patient's condition.  Marca Ancona 01/28/2023 8:41 AM

## 2023-01-30 DIAGNOSIS — N186 End stage renal disease: Secondary | ICD-10-CM | POA: Diagnosis not present

## 2023-01-30 DIAGNOSIS — Z515 Encounter for palliative care: Secondary | ICD-10-CM | POA: Diagnosis not present

## 2023-01-30 DIAGNOSIS — A419 Sepsis, unspecified organism: Secondary | ICD-10-CM | POA: Diagnosis not present

## 2023-01-30 DIAGNOSIS — I4891 Unspecified atrial fibrillation: Secondary | ICD-10-CM

## 2023-01-30 DIAGNOSIS — R6521 Severe sepsis with septic shock: Secondary | ICD-10-CM

## 2023-01-30 DIAGNOSIS — J9601 Acute respiratory failure with hypoxia: Secondary | ICD-10-CM | POA: Diagnosis not present

## 2023-01-30 DIAGNOSIS — E8770 Fluid overload, unspecified: Secondary | ICD-10-CM | POA: Diagnosis not present

## 2023-01-30 LAB — POCT I-STAT, CHEM 8
BUN: 44 mg/dL — ABNORMAL HIGH (ref 6–20)
BUN: 45 mg/dL — ABNORMAL HIGH (ref 6–20)
BUN: 46 mg/dL — ABNORMAL HIGH (ref 6–20)
BUN: 47 mg/dL — ABNORMAL HIGH (ref 6–20)
BUN: 49 mg/dL — ABNORMAL HIGH (ref 6–20)
BUN: 51 mg/dL — ABNORMAL HIGH (ref 6–20)
BUN: 51 mg/dL — ABNORMAL HIGH (ref 6–20)
BUN: 55 mg/dL — ABNORMAL HIGH (ref 6–20)
BUN: 56 mg/dL — ABNORMAL HIGH (ref 6–20)
BUN: 57 mg/dL — ABNORMAL HIGH (ref 6–20)
Calcium, Ion: 0.35 mmol/L — CL (ref 1.15–1.40)
Calcium, Ion: 0.35 mmol/L — CL (ref 1.15–1.40)
Calcium, Ion: 0.36 mmol/L — CL (ref 1.15–1.40)
Calcium, Ion: 0.36 mmol/L — CL (ref 1.15–1.40)
Calcium, Ion: 0.37 mmol/L — CL (ref 1.15–1.40)
Calcium, Ion: 0.86 mmol/L — CL (ref 1.15–1.40)
Calcium, Ion: 0.9 mmol/L — ABNORMAL LOW (ref 1.15–1.40)
Calcium, Ion: 0.93 mmol/L — ABNORMAL LOW (ref 1.15–1.40)
Calcium, Ion: 0.93 mmol/L — ABNORMAL LOW (ref 1.15–1.40)
Calcium, Ion: 0.98 mmol/L — ABNORMAL LOW (ref 1.15–1.40)
Chloride: 100 mmol/L (ref 98–111)
Chloride: 101 mmol/L (ref 98–111)
Chloride: 102 mmol/L (ref 98–111)
Chloride: 92 mmol/L — ABNORMAL LOW (ref 98–111)
Chloride: 93 mmol/L — ABNORMAL LOW (ref 98–111)
Chloride: 94 mmol/L — ABNORMAL LOW (ref 98–111)
Chloride: 94 mmol/L — ABNORMAL LOW (ref 98–111)
Chloride: 96 mmol/L — ABNORMAL LOW (ref 98–111)
Chloride: 97 mmol/L — ABNORMAL LOW (ref 98–111)
Chloride: 98 mmol/L (ref 98–111)
Creatinine, Ser: 3.1 mg/dL — ABNORMAL HIGH (ref 0.61–1.24)
Creatinine, Ser: 3.1 mg/dL — ABNORMAL HIGH (ref 0.61–1.24)
Creatinine, Ser: 3.3 mg/dL — ABNORMAL HIGH (ref 0.61–1.24)
Creatinine, Ser: 3.3 mg/dL — ABNORMAL HIGH (ref 0.61–1.24)
Creatinine, Ser: 3.3 mg/dL — ABNORMAL HIGH (ref 0.61–1.24)
Creatinine, Ser: 4.1 mg/dL — ABNORMAL HIGH (ref 0.61–1.24)
Creatinine, Ser: 4.3 mg/dL — ABNORMAL HIGH (ref 0.61–1.24)
Creatinine, Ser: 4.4 mg/dL — ABNORMAL HIGH (ref 0.61–1.24)
Creatinine, Ser: 4.5 mg/dL — ABNORMAL HIGH (ref 0.61–1.24)
Creatinine, Ser: 4.6 mg/dL — ABNORMAL HIGH (ref 0.61–1.24)
Glucose, Bld: 152 mg/dL — ABNORMAL HIGH (ref 70–99)
Glucose, Bld: 156 mg/dL — ABNORMAL HIGH (ref 70–99)
Glucose, Bld: 177 mg/dL — ABNORMAL HIGH (ref 70–99)
Glucose, Bld: 181 mg/dL — ABNORMAL HIGH (ref 70–99)
Glucose, Bld: 190 mg/dL — ABNORMAL HIGH (ref 70–99)
Glucose, Bld: 199 mg/dL — ABNORMAL HIGH (ref 70–99)
Glucose, Bld: 199 mg/dL — ABNORMAL HIGH (ref 70–99)
Glucose, Bld: 211 mg/dL — ABNORMAL HIGH (ref 70–99)
Glucose, Bld: 220 mg/dL — ABNORMAL HIGH (ref 70–99)
Glucose, Bld: 232 mg/dL — ABNORMAL HIGH (ref 70–99)
HCT: 43 % (ref 39.0–52.0)
HCT: 44 % (ref 39.0–52.0)
HCT: 45 % (ref 39.0–52.0)
HCT: 46 % (ref 39.0–52.0)
HCT: 47 % (ref 39.0–52.0)
HCT: 48 % (ref 39.0–52.0)
HCT: 51 % (ref 39.0–52.0)
HCT: 51 % (ref 39.0–52.0)
HCT: 53 % — ABNORMAL HIGH (ref 39.0–52.0)
HCT: 53 % — ABNORMAL HIGH (ref 39.0–52.0)
Hemoglobin: 14.6 g/dL (ref 13.0–17.0)
Hemoglobin: 15 g/dL (ref 13.0–17.0)
Hemoglobin: 15.3 g/dL (ref 13.0–17.0)
Hemoglobin: 15.6 g/dL (ref 13.0–17.0)
Hemoglobin: 16 g/dL (ref 13.0–17.0)
Hemoglobin: 16.3 g/dL (ref 13.0–17.0)
Hemoglobin: 17.3 g/dL — ABNORMAL HIGH (ref 13.0–17.0)
Hemoglobin: 17.3 g/dL — ABNORMAL HIGH (ref 13.0–17.0)
Hemoglobin: 18 g/dL — ABNORMAL HIGH (ref 13.0–17.0)
Hemoglobin: 18 g/dL — ABNORMAL HIGH (ref 13.0–17.0)
Potassium: 3.1 mmol/L — ABNORMAL LOW (ref 3.5–5.1)
Potassium: 3.1 mmol/L — ABNORMAL LOW (ref 3.5–5.1)
Potassium: 3.4 mmol/L — ABNORMAL LOW (ref 3.5–5.1)
Potassium: 3.6 mmol/L (ref 3.5–5.1)
Potassium: 3.7 mmol/L (ref 3.5–5.1)
Potassium: 3.7 mmol/L (ref 3.5–5.1)
Potassium: 3.7 mmol/L (ref 3.5–5.1)
Potassium: 3.8 mmol/L (ref 3.5–5.1)
Potassium: 3.9 mmol/L (ref 3.5–5.1)
Potassium: 4.4 mmol/L (ref 3.5–5.1)
Sodium: 135 mmol/L (ref 135–145)
Sodium: 135 mmol/L (ref 135–145)
Sodium: 135 mmol/L (ref 135–145)
Sodium: 136 mmol/L (ref 135–145)
Sodium: 136 mmol/L (ref 135–145)
Sodium: 137 mmol/L (ref 135–145)
Sodium: 137 mmol/L (ref 135–145)
Sodium: 137 mmol/L (ref 135–145)
Sodium: 137 mmol/L (ref 135–145)
Sodium: 137 mmol/L (ref 135–145)
TCO2: 23 mmol/L (ref 22–32)
TCO2: 23 mmol/L (ref 22–32)
TCO2: 24 mmol/L (ref 22–32)
TCO2: 25 mmol/L (ref 22–32)
TCO2: 28 mmol/L (ref 22–32)
TCO2: 28 mmol/L (ref 22–32)
TCO2: 29 mmol/L (ref 22–32)
TCO2: 29 mmol/L (ref 22–32)
TCO2: 31 mmol/L (ref 22–32)
TCO2: 31 mmol/L (ref 22–32)

## 2023-01-30 LAB — RENAL FUNCTION PANEL
Albumin: 4.1 g/dL (ref 3.5–5.0)
Albumin: 3.9 g/dL (ref 3.5–5.0)
Anion gap: 23 — ABNORMAL HIGH (ref 5–15)
Anion gap: 21 — ABNORMAL HIGH (ref 5–15)
BUN: 57 mg/dL — ABNORMAL HIGH (ref 6–20)
BUN: 54 mg/dL — ABNORMAL HIGH (ref 6–20)
CO2: 19 mmol/L — ABNORMAL LOW (ref 22–32)
CO2: 24 mmol/L (ref 22–32)
Calcium: 10.7 mg/dL — ABNORMAL HIGH (ref 8.9–10.3)
Calcium: 10.5 mg/dL — ABNORMAL HIGH (ref 8.9–10.3)
Chloride: 91 mmol/L — ABNORMAL LOW (ref 98–111)
Chloride: 88 mmol/L — ABNORMAL LOW (ref 98–111)
Creatinine, Ser: 4.37 mg/dL — ABNORMAL HIGH (ref 0.61–1.24)
Creatinine, Ser: 4.47 mg/dL — ABNORMAL HIGH (ref 0.61–1.24)
GFR, Estimated: 17 mL/min — ABNORMAL LOW (ref >60)
GFR, Estimated: 16 mL/min — ABNORMAL LOW (ref >60)
Glucose, Bld: 201 mg/dL — ABNORMAL HIGH (ref 70–99)
Glucose, Bld: 162 mg/dL — ABNORMAL HIGH (ref 70–99)
Phosphorus: 3.6 mg/dL (ref 2.5–4.6)
Phosphorus: 4 mg/dL (ref 2.5–4.6)
Potassium: 4.4 mmol/L (ref 3.5–5.1)
Potassium: 3.5 mmol/L (ref 3.5–5.1)
Sodium: 133 mmol/L — ABNORMAL LOW (ref 135–145)
Sodium: 133 mmol/L — ABNORMAL LOW (ref 135–145)

## 2023-01-30 LAB — CBC WITH DIFFERENTIAL/PLATELET
Abs Immature Granulocytes: 1.14 10*3/uL — ABNORMAL HIGH (ref 0.00–0.07)
Basophils Absolute: 0.2 10*3/uL — ABNORMAL HIGH (ref 0.0–0.1)
Basophils Relative: 1 %
Eosinophils Absolute: 0.1 10*3/uL (ref 0.0–0.5)
Eosinophils Relative: 1 %
HCT: 43.6 % (ref 39.0–52.0)
Hemoglobin: 14 g/dL (ref 13.0–17.0)
Immature Granulocytes: 6 %
Lymphocytes Relative: 11 %
Lymphs Abs: 2.1 10*3/uL (ref 0.7–4.0)
MCH: 32.5 pg (ref 26.0–34.0)
MCHC: 32.1 g/dL (ref 30.0–36.0)
MCV: 101.2 fL — ABNORMAL HIGH (ref 80.0–100.0)
Monocytes Absolute: 2.6 10*3/uL — ABNORMAL HIGH (ref 0.1–1.0)
Monocytes Relative: 13 %
Neutro Abs: 13.9 10*3/uL — ABNORMAL HIGH (ref 1.7–7.7)
Neutrophils Relative %: 68 %
Platelets: 327 10*3/uL (ref 150–400)
RBC: 4.31 MIL/uL (ref 4.22–5.81)
RDW: 17.5 % — ABNORMAL HIGH (ref 11.5–15.5)
WBC: 20 10*3/uL — ABNORMAL HIGH (ref 4.0–10.5)
nRBC: 0.7 % — ABNORMAL HIGH (ref 0.0–0.2)

## 2023-01-30 LAB — COOXEMETRY PANEL
Carboxyhemoglobin: 1.8 % — ABNORMAL HIGH (ref 0.5–1.5)
Methemoglobin: <0.7 % (ref 0.0–1.5)
O2 Saturation: 52.5 %
Total hemoglobin: 14 g/dL (ref 12.0–16.0)

## 2023-01-30 LAB — POCT I-STAT 7, (LYTES, BLD GAS, ICA,H+H)
Acid-base deficit: 4 mmol/L — ABNORMAL HIGH (ref 0.0–2.0)
Bicarbonate: 22 mmol/L (ref 20.0–28.0)
Calcium, Ion: 0.96 mmol/L — ABNORMAL LOW (ref 1.15–1.40)
HCT: 47 % (ref 39.0–52.0)
Hemoglobin: 16 g/dL (ref 13.0–17.0)
O2 Saturation: 93 %
Patient temperature: 100
Potassium: 4.4 mmol/L (ref 3.5–5.1)
Sodium: 134 mmol/L — ABNORMAL LOW (ref 135–145)
TCO2: 23 mmol/L (ref 22–32)
pCO2 arterial: 43.7 mmHg (ref 32–48)
pH, Arterial: 7.314 — ABNORMAL LOW (ref 7.35–7.45)
pO2, Arterial: 75 mmHg — ABNORMAL LOW (ref 83–108)

## 2023-01-30 LAB — GLUCOSE, CAPILLARY
Glucose-Capillary: 192 mg/dL — ABNORMAL HIGH (ref 70–99)
Glucose-Capillary: 165 mg/dL — ABNORMAL HIGH (ref 70–99)
Glucose-Capillary: 164 mg/dL — ABNORMAL HIGH (ref 70–99)
Glucose-Capillary: 155 mg/dL — ABNORMAL HIGH (ref 70–99)

## 2023-01-30 LAB — MAGNESIUM: Magnesium: 2.6 mg/dL — ABNORMAL HIGH (ref 1.7–2.4)

## 2023-01-30 MED ORDER — PRISMASOL B22GK 4/0 22-4 MEQ/L EC SOLN
Status: DC
  Filled 2023-01-30 (×4): qty 5000

## 2023-01-30 MED ORDER — LINEZOLID 600 MG/300ML IV SOLN
600.0000 mg | Freq: Two times a day (BID) | INTRAVENOUS | Status: DC
  Administered 2023-01-30 – 2023-01-31 (×4): 600 mg via INTRAVENOUS
  Filled 2023-01-30 (×5): qty 300

## 2023-01-30 MED ORDER — INSULIN ASPART 100 UNIT/ML IJ SOLN
0.0000 [IU] | INTRAMUSCULAR | Status: DC
  Administered 2023-01-30 – 2023-01-31 (×6): 2 [IU] via SUBCUTANEOUS
  Administered 2023-01-31 – 2023-02-01 (×5): 1 [IU] via SUBCUTANEOUS
  Administered 2023-02-01: 2 [IU] via SUBCUTANEOUS
  Administered 2023-02-01: 1 [IU] via SUBCUTANEOUS
  Administered 2023-02-01: 2 [IU] via SUBCUTANEOUS
  Administered 2023-02-01 (×3): 1 [IU] via SUBCUTANEOUS
  Administered 2023-02-02: 2 [IU] via SUBCUTANEOUS
  Administered 2023-02-02: 1 [IU] via SUBCUTANEOUS
  Administered 2023-02-02: 2 [IU] via SUBCUTANEOUS
  Administered 2023-02-02: 1 [IU] via SUBCUTANEOUS
  Administered 2023-02-02 – 2023-02-04 (×13): 2 [IU] via SUBCUTANEOUS
  Administered 2023-02-04: 3 [IU] via SUBCUTANEOUS
  Administered 2023-02-05 (×2): 2 [IU] via SUBCUTANEOUS
  Administered 2023-02-05 (×3): 3 [IU] via SUBCUTANEOUS
  Administered 2023-02-06 (×4): 1 [IU] via SUBCUTANEOUS
  Administered 2023-02-06: 2 [IU] via SUBCUTANEOUS
  Administered 2023-02-07: 1 [IU] via SUBCUTANEOUS
  Administered 2023-02-07 (×3): 2 [IU] via SUBCUTANEOUS
  Administered 2023-02-07 (×2): 1 [IU] via SUBCUTANEOUS
  Administered 2023-02-08: 2 [IU] via SUBCUTANEOUS
  Administered 2023-02-08: 3 [IU] via SUBCUTANEOUS
  Administered 2023-02-08: 1 [IU] via SUBCUTANEOUS
  Administered 2023-02-08 – 2023-02-09 (×6): 2 [IU] via SUBCUTANEOUS
  Administered 2023-02-09: 1 [IU] via SUBCUTANEOUS
  Administered 2023-02-09: 2 [IU] via SUBCUTANEOUS
  Administered 2023-02-10: 3 [IU] via SUBCUTANEOUS
  Administered 2023-02-10 (×3): 2 [IU] via SUBCUTANEOUS
  Administered 2023-02-10: 1 [IU] via SUBCUTANEOUS
  Administered 2023-02-10: 2 [IU] via SUBCUTANEOUS
  Administered 2023-02-11: 1 [IU] via SUBCUTANEOUS
  Administered 2023-02-11: 2 [IU] via SUBCUTANEOUS
  Administered 2023-02-11 (×3): 1 [IU] via SUBCUTANEOUS
  Administered 2023-02-12 (×2): 2 [IU] via SUBCUTANEOUS
  Administered 2023-02-12 (×2): 1 [IU] via SUBCUTANEOUS
  Administered 2023-02-12 – 2023-02-13 (×3): 2 [IU] via SUBCUTANEOUS
  Administered 2023-02-13: 1 [IU] via SUBCUTANEOUS
  Administered 2023-02-13 (×3): 2 [IU] via SUBCUTANEOUS
  Administered 2023-02-13 – 2023-02-14 (×4): 1 [IU] via SUBCUTANEOUS
  Administered 2023-02-14 (×2): 2 [IU] via SUBCUTANEOUS
  Administered 2023-02-15: 3 [IU] via SUBCUTANEOUS
  Administered 2023-02-15 (×2): 2 [IU] via SUBCUTANEOUS
  Administered 2023-02-15 (×2): 3 [IU] via SUBCUTANEOUS
  Administered 2023-02-15 – 2023-02-16 (×3): 2 [IU] via SUBCUTANEOUS
  Administered 2023-02-16: 3 [IU] via SUBCUTANEOUS
  Administered 2023-02-16 – 2023-02-17 (×5): 2 [IU] via SUBCUTANEOUS
  Administered 2023-02-17: 1 [IU] via SUBCUTANEOUS
  Administered 2023-02-17: 2 [IU] via SUBCUTANEOUS
  Administered 2023-02-18 – 2023-02-20 (×9): 1 [IU] via SUBCUTANEOUS
  Administered 2023-02-20: 2 [IU] via SUBCUTANEOUS
  Administered 2023-02-20 – 2023-02-22 (×8): 1 [IU] via SUBCUTANEOUS
  Administered 2023-02-22: 2 [IU] via SUBCUTANEOUS
  Administered 2023-02-22 – 2023-02-25 (×8): 1 [IU] via SUBCUTANEOUS
  Administered 2023-02-25 – 2023-02-26 (×3): 2 [IU] via SUBCUTANEOUS
  Administered 2023-02-26: 1 [IU] via SUBCUTANEOUS
  Administered 2023-02-26 – 2023-02-27 (×3): 2 [IU] via SUBCUTANEOUS
  Administered 2023-02-27 – 2023-03-07 (×2): 1 [IU] via SUBCUTANEOUS

## 2023-01-30 MED ORDER — CALCIUM GLUCONATE-NACL 2-0.675 GM/100ML-% IV SOLN: 2.0000 g | Freq: Once | INTRAVENOUS | Status: DC

## 2023-01-30 MED ORDER — DEXTROSE 5 % IV SOLN
20.0000 g | INTRAVENOUS | Status: DC
  Administered 2023-01-31 – 2023-02-03 (×4): 20 g via INTRAVENOUS_CENTRAL
  Filled 2023-01-30 (×4): qty 200

## 2023-01-30 MED ORDER — PRISMASOL BGK 4/2.5 32-4-2.5 MEQ/L EC SOLN: Status: DC

## 2023-01-30 MED ORDER — DEXTROSE 5 % IV SOLN: 20.0000 g | INTRAVENOUS | Status: DC

## 2023-01-30 MED ORDER — SODIUM CHLORIDE 0.9 % IV SOLN
1.0000 g | Freq: Three times a day (TID) | INTRAVENOUS | Status: AC
  Administered 2023-01-30 – 2023-02-10 (×35): 1 g via INTRAVENOUS
  Filled 2023-01-30 (×34): qty 20

## 2023-01-30 MED ORDER — AMIODARONE IV BOLUS ONLY 150 MG/100ML
150.0000 mg | Freq: Once | INTRAVENOUS | Status: AC
  Administered 2023-01-30: 150 mg via INTRAVENOUS
  Filled 2023-01-30: qty 100

## 2023-01-30 MED ORDER — CALCIUM GLUCONATE-NACL 1-0.675 GM/50ML-% IV SOLN: 1.0000 g | Freq: Once | INTRAVENOUS | Status: DC

## 2023-01-30 NOTE — Progress Notes (Addendum)
Patient ID: Phillip Young, male   DOB: 10-08-1985, 37 y.o.   MRN: [id number]     Advanced Heart Failure Rounding Note  PCP-Cardiologist: Marca Ancona, MD   Subjective:   7/4: Back in A fib RVR and underwent urgent cardioversion x3 but failed to convert. Pressor requirements increased. Vaso added and Norepi increased.  7/5: Successful DCCV to NSR.  7/7: Atrial fibrillation with RVR recurred overnight, failed DCCV x 3, started on amiodarone gtt.   Remains intubated, sedated. Tm 103 this morning.  Has been on ceftriaxone for Strep pneumoniae bacteremia.    CVVH running even, CVP 11 on my read.   Now on higher pressor doses with NE 35 + Vaso 0.04 units + midodrine 15 mg tid. MAP stable currently. Co-ox lower at 53%.   He was in rapid AF in 150s-160s this morning, now on amiodarone gtt (team overnight discussed with his mother, has had pulmonary and hepatic toxicity with this in the past, using in last ditch effort).  I attempted DCCV x 3 again this morning and failed to convert him to NSR.   Objective:   Weight Range: (!) 174.4 kg Body mass index is 52.15 kg/m.   Vital Signs:   Temp:  [99.4 F (37.4 C)-103.4 F (39.7 C)] 103.4 F (39.7 C) (07/07 0800) Pulse Rate:  [43-170] 112 (07/07 0600) Resp:  [25-31] 28 (07/07 0600) SpO2:  [96 %-100 %] 96 % (07/07 0752) Arterial Line BP: (89-125)/(46-64) 119/58 (07/07 0600) FiO2 (%):  [40 %] 40 % (07/07 0752) Last BM Date : 01/21/23  Weight change: Filed Weights   01/27/23 0600 01/28/23 0500 01/29/23 0500  Weight: (!) 175 kg (!) 173.3 kg (!) 174.4 kg    Intake/Output:   Intake/Output Summary (Last 24 hours) at 01/30/2023 0815 Last data filed at 01/30/2023 0700 Gross per 24 hour  Intake 3068.72 ml  Output 2649.2 ml  Net 419.52 ml      Physical Exam   General: Sedated on vent Neck: JVP difficult, no thyromegaly or thyroid nodule.  Lungs: Clear to auscultation bilaterally with normal respiratory effort. CV: Nondisplaced PMI.  Heart  tachy, irregular S1/S2, no S3/S4, no murmur.  1+ ankle edema.  Abdomen: Soft, nontender, no hepatosplenomegaly, no distention.  Skin: Intact without lesions or rashes.  Neurologic: Sedated Extremities: No clubbing or cyanosis.  HEENT: Normal.   Telemetry   A fib RVR 150s-160s (personally reviewed)   EKG    N/A   Labs    CBC Recent Labs    01/29/23 0359 01/29/23 1507 01/30/23 0317 01/30/23 0325  WBC 16.3*  --  20.0*  --   NEUTROABS 10.5*  --  13.9*  --   HGB 13.5   < > 14.0 18.0*  HCT 43.1   < > 43.6 53.0*  MCV 100.5*  --  101.2*  --   PLT 372  --  327  --    < > = values in this interval not displayed.   Basic Metabolic Panel Recent Labs    40/98/11 0359 01/29/23 1507 01/29/23 1522 01/29/23 1613 01/30/23 0317 01/30/23 0325  NA 132*   < > 133*   < > 133* 137  K 5.2*   < > 5.1   < > 4.4 3.7  CL 97*   < > 93*   < > 91* 94*  CO2 19*  --  15*  --  19*  --   GLUCOSE 144*   < > 175*   < > 201* 232*  BUN 54*   < > 57*   < > 57* 51*  CREATININE 4.01*   < > 4.51*   < > 4.37* 3.30*  CALCIUM 10.5*  --  10.6*  --  10.7*  --   MG 3.1*  --   --   --  2.6*  --   PHOS 5.3*  --  5.3*  --  3.6  --    < > = values in this interval not displayed.   Liver Function Tests Recent Labs    01/29/23 1522 01/30/23 0317  ALBUMIN 4.2 4.1   No results for input(s): "LIPASE", "AMYLASE" in the last 72 hours. Cardiac Enzymes No results for input(s): "CKTOTAL", "CKMB", "CKMBINDEX", "TROPONINI" in the last 72 hours.  BNP: BNP (last 3 results) Recent Labs    05/22/22 0732 08/05/22 1720 01/21/23 1739  BNP 373.1* 15.5 1,528.6*    ProBNP (last 3 results) No results for input(s): "PROBNP" in the last 8760 hours.   D-Dimer No results for input(s): "DDIMER" in the last 72 hours. Hemoglobin A1C No results for input(s): "HGBA1C" in the last 72 hours. Fasting Lipid Panel Recent Labs    01/29/23 0359  TRIG 352*   Thyroid Function Tests No results for input(s): "TSH",  "T4TOTAL", "T3FREE", "THYROIDAB" in the last 72 hours.  Invalid input(s): "FREET3"  Other results:   Imaging    DG CHEST PORT 1 VIEW  Result Date: 01/29/2023 CLINICAL DATA:  [id number] Encounter for orogastric (OG) tube placement [id number] EXAM: PORTABLE CHEST 1 VIEW COMPARISON:  01/22/2023 FINDINGS: Endotracheal tube terminates within the midthoracic trachea. Enteric tube extends into the stomach. Left IJ central venous catheter and right IJ central venous catheter remain in place. Cardiomegaly. Pulmonary vascular congestion. Left lung base is largely obscured. No new airspace consolidation within the right lung. No large pleural fluid collection. No pneumothorax. IMPRESSION: 1. Enteric tube extends into the stomach. 2. Cardiomegaly with pulmonary vascular congestion. Electronically Signed   By: Duanne Guess D.O.   On: 01/29/2023 09:32     Medications:     Scheduled Medications:  apixaban  5 mg Per Tube BID   Chlorhexidine Gluconate Cloth  6 each Topical Q0600   darbepoetin (ARANESP) injection - DIALYSIS  100 mcg Subcutaneous Q Mon-1800   docusate  100 mg Per Tube BID   feeding supplement (PROSource TF20)  60 mL Per Tube QID   fentaNYL (SUBLIMAZE) injection  50 mcg Intravenous Once   hydrOXYzine  10 mg Per Tube TID   lidocaine  1 patch Transdermal Q24H   melatonin  5 mg Per Tube QHS   midodrine  15 mg Per Tube Q8H   multivitamin  1 tablet Per Tube QHS   mouth rinse  15 mL Mouth Rinse Q2H   pantoprazole (PROTONIX) IV  40 mg Intravenous Q24H   polyethylene glycol  17 g Per Tube BID   senna-docusate  1 tablet Per Tube BID    Infusions:  sodium chloride Stopped (01/22/23 1859)   amiodarone 60 mg/hr (01/30/23 0621)   calcium gluconate 20 g in dextrose 5 % 1,000 mL infusion 30 mL/hr at 01/30/23 0600   cefTRIAXone (ROCEPHIN)  IV Stopped (01/29/23 1038)   citrate dextrose 250 mL/hr at 01/30/23 0600   feeding supplement (VITAL 1.5 CAL) 55 mL/hr at 01/30/23 0600   fentaNYL infusion  INTRAVENOUS 200 mcg/hr (01/30/23 0600)   linezolid (ZYVOX) IV     midazolam 10 mg/hr (01/30/23 0600)   norepinephrine (LEVOPHED) Adult infusion 35 mcg/min (01/30/23  9147)   prismasol B22GK 4/0 400 mL/hr at 01/30/23 0117   prismasol BGK 2/2.5 dialysis solution 1,500 mL/hr at 01/30/23 0115   propofol (DIPRIVAN) infusion Stopped (01/27/23 1719)   vasopressin 0.04 Units/min (01/30/23 0600)    PRN Medications: sodium chloride, acetaminophen **OR** acetaminophen, bisacodyl, fentaNYL, midazolam, ondansetron (ZOFRAN) IV, mouth rinse, polyvinyl alcohol, surgical lubricant    Patient Profile   37 y.o. 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 Started on CRRT. BCx + for strep pneumo   Assessment/Plan   1.  Acute on chronic systolic CHF: Nonischemic CMP, LHC 2021 with no CAD. Previous hospitalizations w/shock in setting of rapid AFib/Flutter requiring milrinone. Most recent echo 5/24 was unchanged with EF 20-25%, RV difficult to visualize.  He signs off early from HD and was significantly volume overloaded this admission. Suspect combination cardiogenic and septic shock at this time, currently on vasopressin 0.04 units, Norepi 35 and midodrine 15 tid.  NE significantly increased overnight with onset again of AF with RVR.  CVVH UF currently running even with increasing pressor requirement, CVP 11 today with co-ox lower at 53.5%.  - Run CVVH even with increasing pressor requirement.    - Continue current support.  If we cannot get him out of rapid atrial fibrillation, he will continue to deteriorate and will not survive.  - Not candidate for advanced therapies or  mechanical support.  2. Atrial flutter/fibrillation: History of multiple DCCVs. Amiodarone previously stopped due to elevated LFTs. Restarted 05/20/22 by Dr. Elberta Fortis.  Concern for amiodarone pulmonary toxicity w/ elevated ESR and CRP 11/23. He has been off amiodarone and was treated with steroids.  AF ablation at [facility] in 12/23. He was in NSR at admission but went into atrial flutter with RVR on 7/3. Had urgent cardioversion x 3 on 7/4 but failed to convert. Successful cardioversion on 7/5.  Back in AF with RVR early am 7/6, amiodarone was restarted (understanding he has had pulmonary and liver toxicity in past) as last ditch effort to try to get him back in NSR.  Now has failed DCCV x 3 early am and DCCV x 3 by me this morning.  Fever to 103 this morning complicates effort to get him in NSR.  - Not candidate for long-term amiodarone (lung and hepatic toxicity) or Tikosyn (ESRD), no long-term anti-arrhythmic option.  - Will continue amiodarone gtt for now given lack of options (have discussed with mother).  - Continue Eliquis 5 mg bid  - If we are unable to slow his rhythm, I do not think he will survive.  3. History of amiodarone lung toxicity: Seen by pulmonary during admit 11/23. Had elevated ESR and sed rate. Treated with prednisone.   4. ESRD: HD per nephrology, on T,Th,Sat schedule.  Poor compliance with chronic treatment (signs off early). Significantly volume overloaded at admission. Weight down with CVVH. CVP 11 today.  - CVVH running even with worsening pressor requirement.  5. ID/Bacteremia: Fever at admission.  Suspect PNA. BCx + for Strep pneumoniae.  Now WBCs up to 20K and fever to 103 this morning.  - Panculture.  - Broaden abx to linezolid + meropenem.  6. OHS/OSA: Has been on CPAP and home  oxygen.  7. Lower extremity wounds: - Per primary  - Wound consulted 8. Acute on chronic hypoxemic respiratory failure: Baseline OHS/OSA, now suspect PNA and CHF/volume overload. Intubated.   - Antibiotics as above.  - CVVH.  9. GOC: Palliative care has seen and spoken with family.  They have wanted full code.  I spoke with his mother this morning and told her that she needs to come in.  I worry that he will not survive the day.  Need to continue to discuss code status with her.   CRITICAL CARE Performed by: Marca Ancona  Total critical care time: 45 minutes  Critical care time was exclusive of separately billable procedures and treating other patients.  Critical care was necessary to treat or prevent imminent or life-threatening deterioration.  Critical care was time spent personally by me on the following activities: development of treatment plan with patient and/or surrogate as well as nursing, discussions with consultants, evaluation of patient's response to treatment, examination of patient, obtaining history from patient or surrogate, ordering and performing treatments and interventions, ordering and review of laboratory studies, ordering and review of radiographic studies, pulse oximetry and re-evaluation of patient's condition.  Marca Ancona 01/30/2023 8:15 AM

## 2023-01-30 NOTE — Progress Notes (Addendum)
Daily Progress Note   Patient Name: Phillip Young       Date: 01/30/2023 DOB: 1985-12-26  Age: 37 y.o. MRN#: [id number] Attending Physician: Cheri Fowler, MD Primary Care Physician: Marrianne Mood, MD Admit Date: 01/21/2023  Reason for Consultation/Follow-up: Establishing goals of care  Subjective: 9:00 AM Received notification RN requested to discuss patient's clinical status. I have reviewed medical records including EPIC notes, MAR, and labs. Received report from primary RN - patient decompensated overnight. His pressor requirements are increasing and he now has a fever of 103 this morning. Patient converted back to afib with RVR last night - cardioversion was attempted 3 times last night and an additional 3 times this morning - all 6 times without success. As a last effort, amiodarone was started after speaking with patient's mother (patient with history of pulmonary hepatic toxicity). There is a low threshold to stop CRRT. Per RN, family is not at the bedside - requested to be notified when they arrive.  11:20 AM Received notification family are at bedside requesting to meet with PMT.  Went to visit patient at bedside - mother/Phillip Young, uncle/Phillip Young, and another uncle present. Patient was lying in bed intubated and sedated. Bedside monitor shows HR between 150-170s. Patient is ill appearing.  Emotional support provided to family. They have a clear understanding of the events that happened overnight. [name] expresses appreciation for all of the updates and thorough medical care the patient has received. She understands there are no further interventions that can be offered. She understands amiodarone was started as a last effort even though patient has had serious reactions to the medication in  the past. [name] asks "where do we go from here."  Attempted to discuss recommendation for patient's transition to full comfort measures; however, [name] tells me she is interested in patient's transfer to [facility] "because they might have research." Clarified if she was asking for a second opinion vs body donation to research - she wants a second opinion. She feels they may have other interventions to offer. Reviewed with family that patient is acutely ill and high risk for passing away during transfer. Reviewed that [facility] may not accept his case if they feel there is nothing else they can offer. [name] expresses understanding and tells me "can we ask." Notified RN to request Dr. Diannia Ruder presence to discuss a  possible transfer to [facility]. I appreciate Dr. Diannia Ruder quick response and discussion with family. After discussion, he will see if [facility] would accept patient's case. Family indicate if so, they are willing to take the risk of him dying in route. Family indicate "if he's dying anyway, what is the difference, at least we can say we tried."   Continued discussions and therapeutic presence after family spoke with Dr. Merrily Pew. We reviewed that patient's current heart rate is not sustainable and likely impending cardiac arrest. DNR was recommended. Reviewed with family that evidenced based poor outcomes in similar hospitalized patient, as the cause of arrest is likely associated with advanced chronic/terminal illness rather than an easily reversible acute cardio-pulmonary event.  I shared that even if we pursued resuscitation we would not able to resolve the underlying factors. I explained that DNR does not change the medical plan and it only comes into effect after a person has arrested (died).  It is a protective measure to keep [place] from harming the patient in their last moments of life. [name] is not agreeable to DNR today. She indicates again, "I know he's dying, but I want to try."   One of the uncles at bedside  indicates "I know 100% he (patient) will walk out of here. He is a IT sales professional." Therapeutic listening provided to family as they reflect on patient's health struggles and how he overcame them. They do understand despite recovery he remained in poor heath. I encouraged family to consider that we are prolonging patient's death, not restoring life. I validated I know they were hopeful interventions would work, but maybe we have to shift what we are hopeful for: cure to comfort focused care. Reviewed that patient has been hospitalized for 9 days without improvement, worsening status each day. Uncle continues to believe patient will recover. [name] however, does seem more realistic indicating she knows patient is dying; however, she is "his mother and I can't just stop." Family do not feel the patient is suffering. I let family know, if there was anyone that would want to see the patient before he passes away, I would recommend they come today, sooner than later. Family understand patient's prognosis is grim.  Family express appreciation for PMT support and for medical team "doing all they can."  All questions and concerns addressed. Encouraged to call with questions and/or concerns. PMT card previously provided.  Length of Stay: 9  Current Medications: Scheduled Meds:   apixaban  5 mg Per Tube BID   Chlorhexidine Gluconate Cloth  6 each Topical Q0600   darbepoetin (ARANESP) injection - DIALYSIS  100 mcg Subcutaneous Q Mon-1800   docusate  100 mg Per Tube BID   feeding supplement (PROSource TF20)  60 mL Per Tube QID   fentaNYL (SUBLIMAZE) injection  50 mcg Intravenous Once   hydrOXYzine  10 mg Per Tube TID   insulin aspart  0-9 Units Subcutaneous Q4H   lidocaine  1 patch Transdermal Q24H   melatonin  5 mg Per Tube QHS   multivitamin  1 tablet Per Tube QHS   mouth rinse  15 mL Mouth Rinse Q2H   pantoprazole (PROTONIX) IV  40 mg Intravenous Q24H   polyethylene glycol  17 g Per Tube BID    senna-docusate  1 tablet Per Tube BID    Continuous Infusions:  sodium chloride Stopped (01/22/23 1859)   amiodarone 60 mg/hr (01/30/23 1206)   calcium gluconate 20 g in dextrose 5 % 1,000 mL infusion 30 mL/hr at 01/30/23  1200   citrate dextrose 250 mL/hr at 01/30/23 1100   fentaNYL infusion INTRAVENOUS 200 mcg/hr (01/30/23 1200)   linezolid (ZYVOX) IV Stopped (01/30/23 1111)   meropenem (MERREM) IV Stopped (01/30/23 1000)   midazolam 10 mg/hr (01/30/23 1234)   norepinephrine (LEVOPHED) Adult infusion 30 mcg/min (01/30/23 1200)   prismasol B22GK 4/0 400 mL/hr at 01/30/23 0117   prismasol BGK 2/2.5 dialysis solution 1,500 mL/hr at 01/30/23 0115   propofol (DIPRIVAN) infusion Stopped (01/27/23 1719)   vasopressin 0.04 Units/min (01/30/23 1200)    PRN Meds: sodium chloride, acetaminophen **OR** acetaminophen, bisacodyl, fentaNYL, midazolam, ondansetron (ZOFRAN) IV, mouth rinse, polyvinyl alcohol, surgical lubricant  Physical Exam Vitals and nursing note reviewed.  Constitutional:      General: He is not in acute distress.    Appearance: He is ill-appearing.     Interventions: He is sedated and intubated.  Pulmonary:     Effort: No respiratory distress. He is intubated.  Skin:    General: Skin is warm and dry.  Neurological:     Mental Status: He is unresponsive.             Vital Signs: BP 96/70   Pulse 82   Temp (S) (!) 103.3 F (39.6 C) (Oral)   Resp (!) 28   Ht 6' (1.829 m)   Wt (!) 174.4 kg   SpO2 98%   BMI 52.15 kg/m  SpO2: SpO2: 98 % O2 Device: O2 Device: Ventilator O2 Flow Rate: O2 Flow Rate (L/min): 13 L/min  Intake/output summary:  Intake/Output Summary (Last 24 hours) at 01/30/2023 1244 Last data filed at 01/30/2023 1200 Gross per 24 hour  Intake 4973.64 ml  Output 2661.3 ml  Net 2312.34 ml   LBM: Last BM Date : 01/21/23 Baseline Weight: Weight: (!) 182.1 kg Most recent weight: Weight: (!) 174.4 kg       Palliative Assessment/Data: PPS 30% with tube  feeds      Patient Active Problem List   Diagnosis Date Noted   Pressure injury of skin 01/23/2023   Acute hypoxic respiratory failure ([name]) 01/21/2023   A-fib ([name]) 01/21/2023   Acute on chronic hypoxic respiratory failure ([name]) 08/07/2022   Decompensated heart failure ([name]) 08/05/2022   Elevated liver enzymes 06/08/2022   Multiple nodules of lung 05/31/2022   Sepsis ([name])    Advanced care planning/counseling discussion    Recurrent infections 09/30/2021   Reactive depression 09/02/2021   Hypervolemia 08/18/2021   Type 2 diabetes mellitus with diabetic chronic kidney disease ([name]) 06/26/2021   Medical non-compliance 04/21/2021   Hypotension 04/21/2021   ESRD (end stage renal disease) on dialysis ([name]) 04/16/2021   Paroxysmal atrial flutter ([name]) 04/12/2021   Chronic respiratory failure with hypoxia ([name]) 02/11/2021   Chronic systolic CHF (congestive heart failure) ([name])    Chronic cough    Morbid obesity ([name]) 02/26/2020   GERD without esophagitis 02/26/2020   OSA (obstructive sleep apnea) 02/26/2020   Hidradenitis suppurativa 02/26/2020    Palliative Care Assessment & Plan   Patient Profile: 37 y.o. male with past medical history of AF, HFrEF (EF 20-25%), severe obesity, ESRD on HD, chronic hypoxic respiratory failure, OSA presented to ED on 01/22/23 from home with severe dyspnea - poor adherence to outpatient HD and fluid restriction. Patient was admitted on 01/21/2023 with acute on chronic hypoxic respiratory failure, hypervolemic, sepsis/septic shock, afib with RVR, chest pain, hematuria. CRRT was started 01/22/23. Cardiology was consulted: he is not a candidate for advanced therapies or ICD due to  frailty, obesity, and dialysis. Cardioversion was attempted x3 on 01/27/23 without success. Options are very limited. If NSR cannot be restored, family will need to consider comfort measures. PMT was consulted to assist family with GOC.   Assessment: Principal Problem:   Acute  hypoxic respiratory failure ([name]) Active Problems:   Morbid obesity ([name])   Chronic systolic CHF (congestive heart failure) ([name])   ESRD (end stage renal disease) on dialysis ([name])   Hypervolemia   Sepsis ([name])   A-fib ([name])   Pressure injury of skin   Concern about end of life  Recommendations/Plan: Mother continues to confirm goal for full code/full scope care Mother request patient's transfer to [facility] - Dr. Merrily Pew aware Family understand patient's prognosis is grim - recommendation was given for DNR and full comfort measures Encouraged family/friend visitation today in context of prognosis PMT will continue to follow and support holistically  Goals of Care and Additional Recommendations: Limitations on Scope of Treatment: Full Scope Treatment  Code Status:    Code Status Orders  (From admission, onward)           Start     Ordered   01/21/23 2051  Full code  Continuous       Question:  By:  Answer:  Consent: discussion documented in EHR   01/21/23 2058           Code Status History     Date Active Date Inactive Code Status Order ID Comments User Context   08/05/2022 2158 08/13/2022 1951 Full Code [id number]  Doran Stabler, DO ED   05/22/2022 1545 06/10/2022 2021 Full Code [id number]  [name] Mann, DO ED   03/26/2022 0830 03/28/2022 2024 Full Code [id number]  Marolyn Haller, MD ED   09/02/2021 1646 09/04/2021 1906 Full Code [id number]  Marolyn Haller, MD ED   08/18/2021 2033 08/23/2021 0004 Full Code [id number]  Merrilyn Puma, MD ED   04/12/2021 2154 05/20/2021 0156 Full Code [id number]  Hillary Bow, DO ED   02/11/2021 1849 03/26/2021 1740 Full Code [id number]  Orland Mustard, MD ED   10/08/2020 1424 10/16/2020 2059 Full Code [id number]  Claudean Severance, MD ED   02/26/2020 2340 03/11/2020 2330 Full Code [id number]  Shalhoub, Deno Lunger, MD ED       Prognosis:  <24 hours would not be surprising   Discharge Planning: To Be Determined  Care plan was discussed with  primary RN, Dr. Merrily Pew, patient's family  Thank you for allowing the Palliative Medicine Team to assist in the care of this patient.   Total Time 70 minutes Prolonged Time Billed  yes       Greater than 50%  of this time was spent counseling and coordinating care related to the above assessment and plan.  Haskel Khan, NP  Please contact Palliative Medicine Team phone at [phone] for questions and concerns.   *Portions of this note are a verbal dictation therefore any spelling and/or grammatical errors are due to the "[employer]" system interpretation.

## 2023-01-30 NOTE — Progress Notes (Signed)
Pharmacy Antibiotic Note  Phillip Young is a 37 y.o. male admitted on 01/21/2023 with bacteremia.  Pharmacy has been consulted for meropenem dosing.  Grew strep pneumoniae in Bcx on 6/28 and has been treated with ceftriaxone appropriately. Repeat Bcx neg. Overnight febrile, Tmax 103.4 on CRRT. Pressor demand escalating. Attempted multiple cardioversion but unsuccessful. WBC increasing to 20. Discussed with team and will escalate antibiotics with potential repeat cardioversion if fevers improve.   Plan: Linezolid 600 mg IV every 12 hours Meropenem 1g IV every 8 hours Monitor fever curve, cx results, clinical pic  Height: 6' (182.9 cm) Weight: (!) 174.4 kg (384 lb 7.7 oz) IBW/kg (Calculated) : 77.6  Temp (24hrs), Avg:101.3 F (38.5 C), Min:99.4 F (37.4 C), Max:103.4 F (39.7 C)  Recent Labs  Lab 01/26/23 0351 01/26/23 0352 01/27/23 0318 01/27/23 1613 01/28/23 0330 01/28/23 1600 01/29/23 0359 01/29/23 1507 01/29/23 2208 01/30/23 0004 01/30/23 0006 01/30/23 0317 01/30/23 0325  WBC 10.2  --  14.1*  --  16.5*  --  16.3*  --   --   --   --  20.0*  --   CREATININE  --    < > 3.19*   < > 3.53*   < > 4.01*   < > 3.20* 3.30* 4.60* 4.37* 3.30*   < > = values in this interval not displayed.    Estimated Creatinine Clearance: 50.4 mL/min (A) (by C-G formula based on SCr of 3.3 mg/dL (H)).    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, SRA positive   Metoprolol Other (See Comments)    near syncope   Amoxicillin Other (See Comments)    Was hospitalized    Other Swelling and Other (See Comments)    Steroids Fluid seeping out of legs     Antimicrobials this admission: 7/7 Meropenem >>  7/7 Linezolid >> 6/29 CTX >> 7/7 6/28 Vancomycin >> 7/1 6/28 Cefepime >> 6/29  Dose  adjustments this admission: N/A  Microbiology results: 7/7 Bcx: sent  7/1 BCx: neg  6/29 Ucx: ng  6/29 MRSA PCR: neg  6/28 BCx: S pneumo  Thank you for allowing pharmacy to participate in this patient's care,  Sherron Monday, PharmD, BCCCP Clinical Pharmacist  Phone: [phone] 01/30/2023 8:21 AM  Please check AMION for all [facility] Pharmacy phone numbers After 10:00 PM, call Main Pharmacy [phone]

## 2023-01-30 NOTE — Progress Notes (Signed)
Nurse contacted pt's mother to relay the interventions over the night.   Mother stated she was "thankful" for the care, and that she understands the pt is "not doing well." Mother will be in later this morning and would like to talk to palliative care team.

## 2023-01-30 NOTE — Progress Notes (Signed)
[facility] NEPHROLOGY PROGRESS NOTE  Assessment/ Plan: Pt is a 37 y.o. yo male chronic CHF, PAF, morbid obesity, OSA, ESRD on HD with pneumonia and CHF exacerbation.  OP HD orders: [facility], TTS. 4hrs 15 min. F180. Flow rates: 425/autoflow 1.5. 3k 2.5cal. EDW 180kg. Access: TDC. Meds: Mircers 200 mcg every two weeks (last dose 6/13), sensipar 60mg  three times per week, hectorol three times per week . Historically noncompliant with HD I.e. doesn't stay for full prescribed treatment times, high IDWG/does not adhere to fluid restriction   # ESRD: Required emergent HD on 6/29 because of fluid overload.  He is on high doses of Levophed (30) and Vaso for hypotension/shock escalating therapy early AM 7/7 with afib RVR.  Started CRRT on 6/29 with initial UF goal 100-150 cc an hour as tolerated by BP -> 51m/hr and now keeping even with CVP of 7-8. Had to start citrate protocol with filter clotting so often. Post filter and also systemic ionized Ca well maintained by the nurse overnight.    Have a low threshold to stop therapy as the CRRT is temporizing and he is declining; not certain is is tolerating and unable to UF at this time. Currently running even.  Monitor lab.  Discussed with ICU nurse.  # Acute on chronic systolic CHF, nonischemic: EF 24-40%.  Optimize volume with CRRT.  Discussed with the cardiology team.  Not a candidate for ICD and advanced therapies for CHF because of obesity and dialysis.  # Sepsis/septic shock: Had been  on broad-spectrum antibiotics and now on Rocephin, per primary team.  Escalating doses of Levophed.    # Anemia of CKD: Hemoglobin at goal.  Received Aranesp on 7/1.  # Hypercalcemia: 0 calcium both pre and post filter; then had to start citrate bec of frequent clotting.  # A-fib with RVR with hypotension: S/P cardioversion 7/5 -> NSR -> afib RVR 7/6 early AM refractory to [facility] x3 + amiodarone.   Subjective: Seen and examined in ICU.  On Levo 30 and  Vaso, CVP 7-8. Afib RVR overnight DCCV x3, increased pressor requirements, amiodarone infusion.  . Objective Vital signs in last 24 hours: Vitals:   01/30/23 0400 01/30/23 0500 01/30/23 0600 01/30/23 0752  BP:      Pulse: 98 (!) 170 (!) 112   Resp: (!) 29 (!) 28 (!) 28   Temp:      TempSrc:      SpO2: 98% 99% 99% 96%  Weight:      Height:       Weight change:   Intake/Output Summary (Last 24 hours) at 01/30/2023 0757 Last data filed at 01/30/2023 0700 Gross per 24 hour  Intake 3174.3 ml  Output 2818.2 ml  Net 356.1 ml       Labs: RENAL PANEL Recent Labs  Lab 01/26/23 0351 01/26/23 0352 01/27/23 0318 01/27/23 1613 01/28/23 0330 01/28/23 1600 01/29/23 0359 01/29/23 1507 01/29/23 1522 01/29/23 1613 01/29/23 2208 01/30/23 0004 01/30/23 0006 01/30/23 0314 01/30/23 0317 01/30/23 0325  NA  --    < > 131*   < > 131* 133* 132*   < > 133*   < > 138 137 135 134* 133* 137  K  --    < > 5.0   < > 5.6* 5.4* 5.2*   < > 5.1   < > 3.9 3.8 4.4 4.4 4.4 3.7  CL  --    < > 92*   < > 96* 100 97*   < >  93*   < > 97* 97* 101  --  91* 94*  CO2  --    < > 20*   < > 21* 18* 19*  --  15*  --   --   --   --   --  19*  --   GLUCOSE  --    < > 139*   < > 141* 148* 144*   < > 175*   < > 214* 220* 190*  --  201* 232*  BUN  --    < > 40*   < > 46* 55* 54*   < > 57*   < > 53* 51* 57*  --  57* 51*  CREATININE  --    < > 3.19*   < > 3.53* 4.06* 4.01*   < > 4.51*   < > 3.20* 3.30* 4.60*  --  4.37* 3.30*  CALCIUM  --    < > 11.0*   < > 10.3 10.4* 10.5*  --  10.6*  --   --   --   --   --  10.7*  --   MG 2.7*  --  3.2*  --  3.2*  --  3.1*  --   --   --   --   --   --   --  2.6*  --   PHOS  --    < > 4.5   < > 5.0* 5.3* 5.3*  --  5.3*  --   --   --   --   --  3.6  --   ALBUMIN  --    < > 4.3   < > 4.0 4.0 4.0  --  4.2  --   --   --   --   --  4.1  --    < > = values in this interval not displayed.    Liver Function Tests: Recent Labs  Lab 01/29/23 0359 01/29/23 1522 01/30/23 0317  ALBUMIN 4.0  4.2 4.1   No results for input(s): "LIPASE", "AMYLASE" in the last 168 hours. No results for input(s): "AMMONIA" in the last 168 hours. CBC: Recent Labs    03/26/22 1958 03/27/22 0300 06/04/22 1924 06/05/22 0154 08/09/22 0042 08/10/22 0107 01/29/23 0359 01/29/23 1507 01/30/23 0004 01/30/23 0006 01/30/23 0314 01/30/23 0317 01/30/23 0325  HGB  --    < >  --    < > 6.7*   < > 13.5   < > 18.0* 16.0 16.0 14.0 18.0*  MCV  --    < >  --    < > 106.1*   < > 100.5*  --   --   --   --  101.2*  --   VITAMINB12 297  --  5,998*  --   --   --   --   --   --   --   --   --   --   FOLATE  --   --  15.7  --   --   --   --   --   --   --   --   --   --   FERRITIN 370*  --   --   --  156  --   --   --   --   --   --   --   --   TIBC 321  --   --   --  330  --   --   --   --   --   --   --   --  IRON 27*  --   --   --  28*  --   --   --   --   --   --   --   --    < > = values in this interval not displayed.    Cardiac Enzymes: No results for input(s): "CKTOTAL", "CKMB", "CKMBINDEX", "TROPONINI" in the last 168 hours. CBG: Recent Labs  Lab 01/28/23 2350 01/29/23 0412 01/29/23 1304 01/29/23 1519 01/29/23 2336  GLUCAP 157* 136* 134* 179* 186*    Iron Studies: No results for input(s): "IRON", "TIBC", "TRANSFERRIN", "FERRITIN" in the last 72 hours. Studies/Results: DG CHEST PORT 1 VIEW  Result Date: 01/29/2023 CLINICAL DATA:  [id number] Encounter for orogastric (OG) tube placement [id number] EXAM: PORTABLE CHEST 1 VIEW COMPARISON:  01/22/2023 FINDINGS: Endotracheal tube terminates within the midthoracic trachea. Enteric tube extends into the stomach. Left IJ central venous catheter and right IJ central venous catheter remain in place. Cardiomegaly. Pulmonary vascular congestion. Left lung base is largely obscured. No new airspace consolidation within the right lung. No large pleural fluid collection. No pneumothorax. IMPRESSION: 1. Enteric tube extends into the stomach. 2. Cardiomegaly with pulmonary  vascular congestion. Electronically Signed   By: Duanne Guess D.O.   On: 01/29/2023 09:32    Medications: Infusions:  sodium chloride Stopped (01/22/23 1859)   amiodarone 60 mg/hr (01/30/23 0621)   calcium gluconate 20 g in dextrose 5 % 1,000 mL infusion 30 mL/hr at 01/30/23 0600   cefTRIAXone (ROCEPHIN)  IV Stopped (01/29/23 1038)   citrate dextrose 250 mL/hr at 01/30/23 0600   feeding supplement (VITAL 1.5 CAL) 55 mL/hr at 01/30/23 0600   fentaNYL infusion INTRAVENOUS 200 mcg/hr (01/30/23 0600)   midazolam 10 mg/hr (01/30/23 0600)   norepinephrine (LEVOPHED) Adult infusion 35 mcg/min (01/30/23 0611)   prismasol B22GK 4/0 400 mL/hr at 01/30/23 0117   prismasol BGK 2/2.5 dialysis solution 1,500 mL/hr at 01/30/23 0115   propofol (DIPRIVAN) infusion Stopped (01/27/23 1719)   vasopressin 0.04 Units/min (01/30/23 0600)    Scheduled Medications:  apixaban  5 mg Per Tube BID   Chlorhexidine Gluconate Cloth  6 each Topical Q0600   darbepoetin (ARANESP) injection - DIALYSIS  100 mcg Subcutaneous Q Mon-1800   docusate  100 mg Per Tube BID   feeding supplement (PROSource TF20)  60 mL Per Tube QID   fentaNYL (SUBLIMAZE) injection  50 mcg Intravenous Once   hydrOXYzine  10 mg Per Tube TID   lidocaine  1 patch Transdermal Q24H   melatonin  5 mg Per Tube QHS   midodrine  15 mg Per Tube Q8H   multivitamin  1 tablet Per Tube QHS   mouth rinse  15 mL Mouth Rinse Q2H   pantoprazole (PROTONIX) IV  40 mg Intravenous Q24H   polyethylene glycol  17 g Per Tube BID   senna-docusate  1 tablet Per Tube BID    have reviewed scheduled and prn medications.  Physical Exam: General: Ill looking male, intubated, sedated, CVP around 8. Heart: A-fib with RVR, s1s2 nl Lungs: Coarse breath sound bilateral Abdomen:soft, Non-tender, non-distended Extremities: Bilateral leg edema presents Dialysis Access: RIJ TDC in place.  Site clean.  Phillip Young W 01/30/2023,7:57 AM  LOS: 9 days

## 2023-01-30 NOTE — Progress Notes (Signed)
TF d/c'd d/t increase in pressor requirement--per Dr Merrily Pew

## 2023-01-30 NOTE — Progress Notes (Signed)
  Cardiology cross cover note:  Patient went into afib with RVR around midnight. CCM attemped initial DCCV at 120J which did not brake the rhythm. Upon my arrival we attempted a second synchronized cardioversion with 200J but this did not brake the rhythm either. Dig bolus not given due to electrolyte imbalances and low likelihood of improving his rate meaningfully given underlying sympathetic drive. At this point his pressor requirement seemed to have stabilized and decision was to start him on IV amiodarone and not performed another DCCV. Around 3 am his hemodynamic status started to deteriorate again with increasing requirements of vasopressor support. His HR was ranging between 180-200 bpm. A third cardioversion was attempted at 200J with hopes of conversion after being on amiodarone infusion for 3 hrs. This turned out not effective. He was rebolused with 150 of amio and pressor requirements adjusted. At this point his hemodynamics appear to have stabilized again and HR improved to 150 bpm. Amiodarone is temporary and will need to be weaned off given prior lung and hepatic toxicity.  Patient is critically ill and with no options for advanced therapies. With no exit strategy and ongoing palliative care discussions I do not think he is a candidate to institute temporary mechanical support either. At this point we will focus on medical management.

## 2023-01-30 NOTE — Progress Notes (Signed)
Patient's family requested to call [facility] for patient's transfer.  [facility] was called, they denied transfer due to capacity and no difference in care. Patient mother was updated She requested amiodarone to be started as patient developed lung and hepatic toxicity prior and it is not helping control heart rate anyway. Amiodarone was discontinued      Cheri Fowler, MD [facility] See Amion for pager If no response to pager, please call [phone] until 7pm After 7pm, Please call E-link [phone]

## 2023-01-30 NOTE — Progress Notes (Signed)
NAME:  Phillip Young, MRN:  [id number], DOB:  [birth year], LOS: 9 ADMISSION DATE:  01/21/2023, CONSULTATION DATE:  01/22/23 REFERRING MD:  Danise Edge, CHIEF COMPLAINT:  SOB   History of Present Illness:  37 year old man well known to the PCCM and AHF services who presented to [facility]/28 with fevers, joint pains, SOB. PMHx significant for ESRD, NICM, refractory AFib, ?amio-induced lung toxicity. Patient reportedly missed HD on Thursday (has a history of leaving HD early). Reportedly ~10kg over dry weight with worsening breathing status, AFib/RVR and borderline BP.  PCCM consulted for ICU admission/further evaluation and management.  Pertinent Medical History:   Past Medical History:  Diagnosis Date   Acute on chronic respiratory failure with hypoxia ([name]) 04/21/2021   Acute on chronic systolic (congestive) heart failure ([name]) 02/26/2020   Amiodarone toxicity    Anemia    Atrial flutter ([name])    Biventricular congestive heart failure ([name])    Chronic hypoxemic respiratory failure ([name])    Class 3 severe obesity due to excess calories with serious comorbidity and body mass index (BMI) of 50.0 to 59.9 in adult [facility]) 02/26/2020   Essential hypertension 02/26/2020   GERD without esophagitis 02/26/2020   Hidradenitis suppurativa 02/26/2020   NICM (nonischemic cardiomyopathy) ([name])    Obesity hypoventilation syndrome ([name])    OSA (obstructive sleep apnea)    PAF (paroxysmal atrial fibrillation) ([name])    Pneumonia    Prediabetes 02/26/2020   Significant Hospital Events: Including procedures, antibiotic start and stop dates in addition to other pertinent events   6/28 Admit, emergent HD 6/29 Afib/RVR, shock, ICU transfer 6/30 Intubated and placed on vasopressor support and started on CRRT 7/1 Remains intubated on CRRT continue with pressor support 7/2 Agitated with SAT, remains on CRRT and low dose levophed 7/3 low dose NE, ongoing CRRT 7/4 Attempted SBT this am, apneic but severely agitated,  desaturated, HR up 130/140s.  Re-sedated but HR remains 140's, NE up from 6 to . NSR on EKG.  Was pulling 50-100on UF, since kept even for now.  CVP 8 7/5 DCCV again attempted this a.m. with success, remains on norepinephrine and vasopressin.  7/6 went into A-fib with RVR, attempted DCCV x 3, started on amiodarone infusion after amiodarone bolus  Interim History / Subjective:  Multiple DCCV attempts were made overnight due to A-fib with RVR with heart rate in 160s This morning DCCV attempted x 3 without much help Now with increasing vasopressor requirement Started spiking fever with Tmax 103 Coox 53%  Objective:  Blood pressure 96/70, pulse 69, temperature (S) (!) 103.4 F (39.7 C), temperature source Oral, resp. rate (!) 28, height 6' (1.829 m), weight (!) 174.4 kg, SpO2 97 %. CVP:  [2 mmHg-14 mmHg] 14 mmHg  Vent Mode: PRVC FiO2 (%):  [40 %] 40 % Set Rate:  [24 bmp] 24 bmp Vt Set:  [610 mL] 610 mL PEEP:  [5 cmH20] 5 cmH20 Plateau Pressure:  [24 cmH20-27 cmH20] 26 cmH20   Intake/Output Summary (Last 24 hours) at 01/30/2023 7829 Last data filed at 01/30/2023 0800 Gross per 24 hour  Intake 3326.66 ml  Output 2484.2 ml  Net 842.46 ml   Filed Weights   01/27/23 0600 01/28/23 0500 01/29/23 0500  Weight: (!) 175 kg (!) 173.3 kg (!) 174.4 kg   Physical Examination: General: Crtitically ill-appearing morbidly obese male, orally intubated HEENT: [place]/AT, eyes anicteric.  ETT and OGT in place Neuro: Sedated, not following commands.  Eyes are closed.  Pupils 3 mm bilateral reactive to  light Chest: Coarse breath sounds, no wheezes or rhonchi Heart: Irregularly irregular, tachycardic, no murmurs or gallops Abdomen: Soft, nondistended, bowel sounds present Skin: No rash  Labs and images were reviewed  Resolved Hospital Problem List:  Hematuria due to traumatic Foley insertion  Assessment & Plan:  Acute-on-chronic hypoxemic respiratory failure Bilateral multifocal  pneumonia Pulmonary edema due to noncompliance with hemodialysis OHS/OSA, noncompliant with CPAP Continue lung protective ventilation VAP prevention bundle in place PAD protocol with fentanyl and Versed with RASS goal -1 Peak and plateau pressure at goal  Combined cardiogenic and septic shock due to strep pneumo bacteremia, POA BCx grew strep pneumo, likely secondary to bilateral lower lobe pneumonia. Patient with increasing vasopressor requirement, currently on 30 mics of Levophed and vasopressin Stop midodrine considering poor anxiety Antibiotics were broadened to linezolid and meropenem Follow-up repeat cultures  Acute on chronic HFrEF Nonischemic cardiomyopathy Echocardiogram 5/24 with unchanged EF of 20 to 25% Paroxysmal rapid A-fib/flutter with RVR History of multiple DCCVs with history of amiodarone toxicity treated with steroids Had multiple attempts at DCCV overnight and this morning but failed Had to be started on amiodarone overnight after bolus Unfortunately patient heart rate is still not well-controlled Monitor intake and output Coox dropped to 53% Advanced heart failure team is following He is on maximum medical therapy, cannot add GDMT in the setting of shock Continue Eliquis for stroke prophylaxis  End-stage renal disease on hemodialysis Hypervolemic hyponatremia Hyperphosphatemia Hyperkalemia  High anion gap metabolic acidosis Hypercalcemia Patient is noncompliant with hemodialysis Nephrology following Continue CRRT Closely monitor and supplement electrolytes  Acute septic encephalopathy Continue sedation for now to keep RASS goal -1  Morbid obesity Dietitian is following Continue tube feed  Hypertriglyceridemia Propofol was stopped Closely monitor Best Practice (right click and "Reselect all SmartList Selections" daily)   Diet/type: tube feeds DVT prophylaxis: DOAC GI prophylaxis: Protonix Lines: Yes still needed Foley: n/a Code Status:   full code Last date of multidisciplinary goals of care: 7/5: Patient mother was updated at bedside, decision was to continue full scope of care.  Palliative care team is following   The patient is critically ill due to acute on chronic respiratory failure, septic shock.  Critical care was necessary to treat or prevent imminent or life-threatening deterioration.  Critical care was time spent personally by me on the following activities: development of treatment plan with patient and/or surrogate as well as nursing, discussions with consultants, evaluation of patient's response to treatment, examination of patient, obtaining history from patient or surrogate, ordering and performing treatments and interventions, ordering and review of laboratory studies, ordering and review of radiographic studies, pulse oximetry, re-evaluation of patient's condition and participation in multidisciplinary rounds.   During this encounter critical care time was devoted to patient care services described in this note for 36 minutes.     Cheri Fowler, MD [facility] See Amion for pager If no response to pager, please call [phone] until 7pm After 7pm, Please call E-link [phone] s/p left

## 2023-01-30 NOTE — Interval H&P Note (Signed)
History and Physical Interval Note:  01/30/2023 8:05 AM  Phillip Young  has presented today for surgery, with the diagnosis of * No surgery found *.  The various methods of treatment have been discussed with the patient and family. After consideration of risks, benefits and other options for treatment, the patient has consented to  * No surgery found * as a surgical intervention.  The patient's history has been reviewed, patient examined, no change in status, stable for surgery.  I have reviewed the patient's chart and labs.  Questions were answered to the patient's satisfaction.     Kloe Oates [employer]

## 2023-01-30 NOTE — Procedures (Addendum)
Electrical Cardioversion Procedure Note Phillip Young [id number] 31-Aug-1985  Procedure: Electrical Cardioversion Indications:  Atrial Fibrillation  Procedure Details Consent: Risks of procedure as well as the alternatives and risks of each were explained to the (patient/caregiver).  Consent for procedure obtained. Time Out: Verified patient identification, verified procedure, site/side was marked, verified correct patient position, special equipment/implants available, medications/allergies/relevent history reviewed, required imaging and test results available.  Performed  Patient placed on cardiac monitor, pulse oximetry, supplemental oxygen as necessary.  Sedation given:  Versed/Fentanyl Pacer pads placed anterior and posterior chest.  Cardioverted 3 times with sternal pressure.  Cardioverted at 200J.  Evaluation Findings: Post procedure EKG shows: Atrial Fibrillation Complications: None Patient did tolerate procedure well.   Marca Ancona 01/30/2023, 8:13 AM

## 2023-01-31 DIAGNOSIS — J9601 Acute respiratory failure with hypoxia: Secondary | ICD-10-CM | POA: Diagnosis not present

## 2023-01-31 LAB — POCT I-STAT, CHEM 8
BUN: 42 mg/dL — ABNORMAL HIGH (ref 6–20)
BUN: 46 mg/dL — ABNORMAL HIGH (ref 6–20)
BUN: 38 mg/dL — ABNORMAL HIGH (ref 6–20)
BUN: 43 mg/dL — ABNORMAL HIGH (ref 6–20)
BUN: 35 mg/dL — ABNORMAL HIGH (ref 6–20)
BUN: 38 mg/dL — ABNORMAL HIGH (ref 6–20)
Calcium, Ion: 0.36 mmol/L — CL (ref 1.15–1.40)
Calcium, Ion: 0.94 mmol/L — ABNORMAL LOW (ref 1.15–1.40)
Calcium, Ion: 0.4 mmol/L — CL (ref 1.15–1.40)
Calcium, Ion: 1.02 mmol/L — ABNORMAL LOW (ref 1.15–1.40)
Calcium, Ion: 0.39 mmol/L — CL (ref 1.15–1.40)
Calcium, Ion: 1.01 mmol/L — ABNORMAL LOW (ref 1.15–1.40)
Chloride: 94 mmol/L — ABNORMAL LOW (ref 98–111)
Chloride: 97 mmol/L — ABNORMAL LOW (ref 98–111)
Chloride: 93 mmol/L — ABNORMAL LOW (ref 98–111)
Chloride: 97 mmol/L — ABNORMAL LOW (ref 98–111)
Chloride: 93 mmol/L — ABNORMAL LOW (ref 98–111)
Chloride: 98 mmol/L (ref 98–111)
Creatinine, Ser: 3.1 mg/dL — ABNORMAL HIGH (ref 0.61–1.24)
Creatinine, Ser: 4.3 mg/dL — ABNORMAL HIGH (ref 0.61–1.24)
Creatinine, Ser: 3.2 mg/dL — ABNORMAL HIGH (ref 0.61–1.24)
Creatinine, Ser: 4.3 mg/dL — ABNORMAL HIGH (ref 0.61–1.24)
Creatinine, Ser: 3.1 mg/dL — ABNORMAL HIGH (ref 0.61–1.24)
Creatinine, Ser: 4 mg/dL — ABNORMAL HIGH (ref 0.61–1.24)
Glucose, Bld: 162 mg/dL — ABNORMAL HIGH (ref 70–99)
Glucose, Bld: 196 mg/dL — ABNORMAL HIGH (ref 70–99)
Glucose, Bld: 143 mg/dL — ABNORMAL HIGH (ref 70–99)
Glucose, Bld: 184 mg/dL — ABNORMAL HIGH (ref 70–99)
Glucose, Bld: 142 mg/dL — ABNORMAL HIGH (ref 70–99)
Glucose, Bld: 190 mg/dL — ABNORMAL HIGH (ref 70–99)
HCT: 45 % (ref 39.0–52.0)
HCT: 50 % (ref 39.0–52.0)
HCT: 46 % (ref 39.0–52.0)
HCT: 47 % (ref 39.0–52.0)
HCT: 40 % (ref 39.0–52.0)
HCT: 47 % (ref 39.0–52.0)
Hemoglobin: 15.3 g/dL (ref 13.0–17.0)
Hemoglobin: 17 g/dL (ref 13.0–17.0)
Hemoglobin: 15.6 g/dL (ref 13.0–17.0)
Hemoglobin: 16 g/dL (ref 13.0–17.0)
Hemoglobin: 13.6 g/dL (ref 13.0–17.0)
Hemoglobin: 16 g/dL (ref 13.0–17.0)
Potassium: 3.7 mmol/L (ref 3.5–5.1)
Potassium: 3.7 mmol/L (ref 3.5–5.1)
Potassium: 3.6 mmol/L (ref 3.5–5.1)
Potassium: 3.6 mmol/L (ref 3.5–5.1)
Potassium: 3.5 mmol/L (ref 3.5–5.1)
Potassium: 3.7 mmol/L (ref 3.5–5.1)
Sodium: 135 mmol/L (ref 135–145)
Sodium: 137 mmol/L (ref 135–145)
Sodium: 135 mmol/L (ref 135–145)
Sodium: 137 mmol/L (ref 135–145)
Sodium: 135 mmol/L (ref 135–145)
Sodium: 136 mmol/L (ref 135–145)
TCO2: 27 mmol/L (ref 22–32)
TCO2: 30 mmol/L (ref 22–32)
TCO2: 24 mmol/L (ref 22–32)
TCO2: 28 mmol/L (ref 22–32)
TCO2: 26 mmol/L (ref 22–32)
TCO2: 29 mmol/L (ref 22–32)

## 2023-01-31 LAB — RENAL FUNCTION PANEL
Albumin: 3.7 g/dL (ref 3.5–5.0)
Albumin: 3.6 g/dL (ref 3.5–5.0)
Anion gap: 22 — ABNORMAL HIGH (ref 5–15)
Anion gap: 25 — ABNORMAL HIGH (ref 5–15)
BUN: 47 mg/dL — ABNORMAL HIGH (ref 6–20)
BUN: 41 mg/dL — ABNORMAL HIGH (ref 6–20)
CO2: 24 mmol/L (ref 22–32)
CO2: 23 mmol/L (ref 22–32)
Calcium: 11.4 mg/dL — ABNORMAL HIGH (ref 8.9–10.3)
Calcium: 12 mg/dL — ABNORMAL HIGH (ref 8.9–10.3)
Chloride: 87 mmol/L — ABNORMAL LOW (ref 98–111)
Chloride: 87 mmol/L — ABNORMAL LOW (ref 98–111)
Creatinine, Ser: 4.31 mg/dL — ABNORMAL HIGH (ref 0.61–1.24)
Creatinine, Ser: 3.95 mg/dL — ABNORMAL HIGH (ref 0.61–1.24)
GFR, Estimated: 17 mL/min — ABNORMAL LOW (ref >60)
GFR, Estimated: 19 mL/min — ABNORMAL LOW (ref >60)
Glucose, Bld: 143 mg/dL — ABNORMAL HIGH (ref 70–99)
Glucose, Bld: 143 mg/dL — ABNORMAL HIGH (ref 70–99)
Phosphorus: 5.6 mg/dL — ABNORMAL HIGH (ref 2.5–4.6)
Phosphorus: 5.7 mg/dL — ABNORMAL HIGH (ref 2.5–4.6)
Potassium: 3.7 mmol/L (ref 3.5–5.1)
Potassium: 3.6 mmol/L (ref 3.5–5.1)
Sodium: 133 mmol/L — ABNORMAL LOW (ref 135–145)
Sodium: 135 mmol/L (ref 135–145)

## 2023-01-31 LAB — CBC WITH DIFFERENTIAL/PLATELET
Abs Immature Granulocytes: 0.32 10*3/uL — ABNORMAL HIGH (ref 0.00–0.07)
Basophils Absolute: 0.2 10*3/uL — ABNORMAL HIGH (ref 0.0–0.1)
Basophils Relative: 1 %
Eosinophils Absolute: 0 10*3/uL (ref 0.0–0.5)
Eosinophils Relative: 0 %
HCT: 41.7 % (ref 39.0–52.0)
Hemoglobin: 13.5 g/dL (ref 13.0–17.0)
Immature Granulocytes: 1 %
Lymphocytes Relative: 9 %
Lymphs Abs: 2 10*3/uL (ref 0.7–4.0)
MCH: 32.9 pg (ref 26.0–34.0)
MCHC: 32.4 g/dL (ref 30.0–36.0)
MCV: 101.7 fL — ABNORMAL HIGH (ref 80.0–100.0)
Monocytes Absolute: 2.2 10*3/uL — ABNORMAL HIGH (ref 0.1–1.0)
Monocytes Relative: 10 %
Neutro Abs: 17.4 10*3/uL — ABNORMAL HIGH (ref 1.7–7.7)
Neutrophils Relative %: 79 %
Platelets: 233 10*3/uL (ref 150–400)
RBC: 4.1 MIL/uL — ABNORMAL LOW (ref 4.22–5.81)
RDW: 17.5 % — ABNORMAL HIGH (ref 11.5–15.5)
WBC: 22.2 10*3/uL — ABNORMAL HIGH (ref 4.0–10.5)
nRBC: 0.8 % — ABNORMAL HIGH (ref 0.0–0.2)

## 2023-01-31 LAB — COOXEMETRY PANEL
Carboxyhemoglobin: 1.3 % (ref 0.5–1.5)
Methemoglobin: <0.7 % (ref 0.0–1.5)
O2 Saturation: 56.4 %
Total hemoglobin: 13.9 g/dL (ref 12.0–16.0)

## 2023-01-31 LAB — GLUCOSE, CAPILLARY
Glucose-Capillary: 144 mg/dL — ABNORMAL HIGH (ref 70–99)
Glucose-Capillary: 127 mg/dL — ABNORMAL HIGH (ref 70–99)
Glucose-Capillary: 160 mg/dL — ABNORMAL HIGH (ref 70–99)
Glucose-Capillary: 137 mg/dL — ABNORMAL HIGH (ref 70–99)
Glucose-Capillary: 143 mg/dL — ABNORMAL HIGH (ref 70–99)

## 2023-01-31 LAB — MRSA NEXT GEN BY PCR, NASAL: MRSA by PCR Next Gen: NOT DETECTED

## 2023-01-31 LAB — TRIGLYCERIDES: Triglycerides: 483 mg/dL — ABNORMAL HIGH (ref <150)

## 2023-01-31 MED ORDER — VITAL 1.5 CAL PO LIQD
1000.0000 mL | ORAL | Status: DC
  Administered 2023-01-31: 1000 mL

## 2023-01-31 MED ORDER — PRISMASOL BGK 4/2.5 32-4-2.5 MEQ/L REPLACEMENT SOLN: Status: DC

## 2023-01-31 MED ORDER — PRISMASOL B22GK 4/0 22-4 MEQ/L EC SOLN
Status: DC
  Filled 2023-01-31 (×24): qty 5000

## 2023-01-31 MED ORDER — POLYETHYLENE GLYCOL 3350 17 G PO PACK
17.0000 g | PACK | Freq: Three times a day (TID) | ORAL | Status: DC
  Administered 2023-01-31 – 2023-02-01 (×4): 17 g
  Filled 2023-01-31 (×3): qty 1

## 2023-01-31 MED ORDER — SENNOSIDES-DOCUSATE SODIUM 8.6-50 MG PO TABS
2.0000 | ORAL_TABLET | Freq: Two times a day (BID) | ORAL | Status: DC
  Administered 2023-01-31 – 2023-02-01 (×3): 2
  Filled 2023-01-31 (×3): qty 2

## 2023-01-31 NOTE — Progress Notes (Addendum)
NAME:  Nima Sill, MRN:  [id number], DOB:  [birth year], LOS: 10 ADMISSION DATE:  01/21/2023, CONSULTATION DATE:  01/22/23 REFERRING MD:  Danise Edge, CHIEF COMPLAINT:  SOB   History of Present Illness:  37 year old man well known to the PCCM and AHF services who presented to [facility] 6/28 with fevers, joint pains, SOB. PMHx significant for ESRD, NICM, refractory AFib, ?amio-induced lung toxicity. Patient reportedly missed HD on Thursday (has a history of leaving HD early). Reportedly ~10kg over dry weight with worsening breathing status, AFib/RVR and borderline BP.  PCCM consulted for ICU admission/further evaluation and management.  Pertinent Medical History:   Past Medical History:  Diagnosis Date   Acute on chronic respiratory failure with hypoxia ([name]) 04/21/2021   Acute on chronic systolic (congestive) heart failure ([name]) 02/26/2020   Amiodarone toxicity    Anemia    Atrial flutter ([name])    Biventricular congestive heart failure ([name])    Chronic hypoxemic respiratory failure ([name])    Class 3 severe obesity due to excess calories with serious comorbidity and body mass index (BMI) of 50.0 to 59.9 in adult [facility]) 02/26/2020   Essential hypertension 02/26/2020   GERD without esophagitis 02/26/2020   Hidradenitis suppurativa 02/26/2020   NICM (nonischemic cardiomyopathy) ([name])    Obesity hypoventilation syndrome ([name])    OSA (obstructive sleep apnea)    PAF (paroxysmal atrial fibrillation) ([name])    Pneumonia    Prediabetes 02/26/2020   Significant Hospital Events: Including procedures, antibiotic start and stop dates in addition to other pertinent events   6/28 Admit, emergent HD 6/29 Afib/RVR, shock, ICU transfer 6/30 Intubated and placed on vasopressor support and started on CRRT 7/1 Remains intubated on CRRT continue with pressor support 7/2 Agitated with SAT, remains on CRRT and low dose levophed 7/3 low dose NE, ongoing CRRT 7/4 Attempted SBT this am, apneic but severely agitated,  desaturated, HR up 130/140s.  Re-sedated but HR remains 140's, NE up from 6 to . NSR on EKG.  Was pulling 50-100on UF, since kept even for now.  CVP 8 7/5 DCCV again attempted this a.m. with success, remains on norepinephrine and vasopressin.  7/6 went into A-fib with RVR, attempted DCCV x 3, started on amiodarone infusion after amiodarone bolus 7/7 Multiple DCCV attempts without success. TF held due to vomiting.   Interim History / Subjective:  Vasopressors weaning CRRT keeping even HR remains 150s.  Fevers improved Restarting trickle feeds.  COOX 56 CVP 3-6 per RN 5L positive day over day. Still 12 L negative for the admission.   Objective:  Blood pressure 96/70, pulse 87, temperature 98.8 F (37.1 C), temperature source Oral, resp. rate (!) 24, height 6' (1.829 m), weight (!) 174.4 kg, SpO2 100 %. CVP:  [3 mmHg-11 mmHg] 4 mmHg  Vent Mode: PRVC FiO2 (%):  [40 %] 40 % Set Rate:  [24 bmp] 24 bmp Vt Set:  [610 mL] 610 mL PEEP:  [5 cmH20] 5 cmH20 Plateau Pressure:  [13 cmH20-24 cmH20] 13 cmH20   Intake/Output Summary (Last 24 hours) at 01/31/2023 1122 Last data filed at 01/31/2023 1100 Gross per 24 hour  Intake 9224.58 ml  Output 3015.4 ml  Net 6209.18 ml    Filed Weights   01/27/23 0600 01/28/23 0500 01/29/23 0500  Weight: (!) 175 kg (!) 173.3 kg (!) 174.4 kg   Physical Examination: General: obese critically ill appearing male in NAD HEENT: [place]/AT, PERRL, unable to appreciate JVD Neuro: Sedated. RASS -4.  Chest: Diminished/distant bases.  Heart: IRIR, tachy,  rates 150s.  Abdomen: Soft, NT, ND Skin: Grossly intact.     Resolved Hospital Problem List:  Hematuria due to traumatic Foley insertion  Assessment & Plan:  Acute-on-chronic hypoxemic respiratory failure Bilateral multifocal pneumonia Pulmonary edema due to noncompliance with hemodialysis/AonC HF OHS/OSA, noncompliant with CPAP - Continue lung protective ventilation - VAP prevention bundle in place - PAD  protocol with fentanyl and Versed with RASS goal -3 - Not a candidate for weaning trials  Combined cardiogenic and septic shock due to strep pneumo bacteremia, POA - BCx grew strep pneumo, likely secondary to bilateral lower lobe pneumonia. - NE, vaso for MAP goal 65 - Broadened to linezolid and meropenem with worsening pressor demand 7/7 - Repeat BC pending.  - Appreciate heart failure team recommendations.   Acute on chronic HFrEF Nonischemic cardiomyopathy Echocardiogram 5/24 with unchanged EF of 20 to 25% Paroxysmal rapid A-fib/flutter with RVR - History of multiple DCCVs with history of amiodarone toxicity treated with steroids - Had to be started on 7/7 after bolus, DC yesterday at family request.  - Unfortunately patient heart rate is still not well-controlled - Monitor intake and output - Advanced heart failure team is following - He is on maximum medical therapy, cannot add GDMT in the setting of shock - Continue Eliquis for stroke prophylaxis - [facility] contacted 7/7 for potential transfer. Did not accept due to capacity and lack of additional therapies to offer.   End-stage renal disease on hemodialysis Hypervolemic hyponatremia Hyperphosphatemia Hyperkalemia  High anion gap metabolic acidosis Hypercalcemia Patient is noncompliant with hemodialysis - Nephrology following - Continue CRRT - Closely monitor and supplement electrolytes - keeping even due to shock, CVP 3-6  Acute septic encephalopathy Continue sedation for now to keep RASS goal -3  Morbid obesity Dietitian is following TF stopped yesterday for vomiting.  Resume trickles today. If fails we will image the belly.   Hypertriglyceridemia Propofol was stopped Closely monitor  Best Practice (right click and "Reselect all SmartList Selections" daily)   Diet/type: tube feeds DVT prophylaxis: DOAC GI prophylaxis: Protonix Lines: Yes still needed Foley: n/a Code Status:  full code Last date of  multidisciplinary goals of care: 7/5: Patient mother was updated at bedside, decision was to continue full scope of care.  Palliative care team is following   Critical care time 41 minutes   Joneen Roach, AGACNP-BC [facility]  See Amion for personal pager PCCM on call pager [phone] until 7pm. Please call Elink 7p-7a. [phone]  01/31/2023 11:44 AM

## 2023-01-31 NOTE — Progress Notes (Signed)
Patient ID: Phillip Young, male   DOB: Apr 22, 1986, 37 y.o.   MRN: [id number]     Advanced Heart Failure Rounding Note  PCP-Cardiologist: Marca Ancona, MD   Subjective:   7/4: Back in A fib RVR and underwent urgent cardioversion x3 but failed to convert. Pressor requirements increased. Vaso added and Norepi increased.  7/5: Successful DCCV to NSR.  7/7: Atrial fibrillation with RVR recurred overnight, failed DCCV x 3, started on amiodarone gtt. Failed multiple cardioversions. Amio stopped again. No benefit.   Remains intubated, sedated. Tm 103 this morning.  Has been on ceftriaxone for Strep pneumoniae bacteremia.    CVVH running even, CVP 4-5   Remains on NE 24 mcg + vaso 0.04 units+ midodrine 15 mg tid. CO-OX 56%.   Remains intubated/sedate in A fib RVR.  Objective:   Weight Range: (!) 174.4 kg Body mass index is 52.15 kg/m.   Vital Signs:   Temp:  [98.2 F (36.8 C)-103.4 F (39.7 C)] 98.8 F (37.1 C) (07/08 0740) Pulse Rate:  [69-138] 84 (07/08 0700) Resp:  [23-30] 24 (07/08 0700) SpO2:  [90 %-100 %] 100 % (07/08 0700) Arterial Line BP: (100-129)/(49-67) 105/55 (07/08 0700) FiO2 (%):  [40 %] 40 % (07/08 0216) Last BM Date : 01/21/23  Weight change: Filed Weights   01/27/23 0600 01/28/23 0500 01/29/23 0500  Weight: (!) 175 kg (!) 173.3 kg (!) 174.4 kg    Intake/Output:   Intake/Output Summary (Last 24 hours) at 01/31/2023 0748 Last data filed at 01/31/2023 0700 Gross per 24 hour  Intake 9943.15 ml  Output 2975.6 ml  Net 6967.55 ml     CVP 3-4  Physical Exam   General:  Intubated on CRRT HEENT: ETT Neck: supple. no JVD. Carotids 2+ bilat; no bruits. No lymphadenopathy or thryomegaly appreciated. Cor: PMI nondisplaced. Tachy Irregular rate & rhythm. No rubs, gallops or murmurs. Lungs: clear Abdomen: obese, soft, nontender, nondistended. No hepatosplenomegaly. No bruits or masses. Good bowel sounds. Extremities: no cyanosis, clubbing, rash, R and LLE  edema Neuro: Sedated/intubated.  Telemetry   Afib 140-160s personally checked   EKG    N/A   Labs    CBC Recent Labs    01/30/23 0317 01/30/23 0325 01/31/23 0138 01/31/23 0407  WBC 20.0*  --   --  22.2*  NEUTROABS 13.9*  --   --  17.4*  HGB 14.0   < > 15.3 13.5  HCT 43.6   < > 45.0 41.7  MCV 101.2*  --   --  101.7*  PLT 327  --   --  233   < > = values in this interval not displayed.   Basic Metabolic Panel Recent Labs    21/30/86 0359 01/29/23 1507 01/30/23 0317 01/30/23 0325 01/30/23 1558 01/30/23 1946 01/31/23 0138 01/31/23 0407  NA 132*   < > 133*   < > 133*   < > 135 133*  K 5.2*   < > 4.4   < > 3.5   < > 3.7 3.7  CL 97*   < > 91*   < > 88*   < > 97* 87*  CO2 19*   < > 19*  --  24  --   --  24  GLUCOSE 144*   < > 201*   < > 162*   < > 162* 143*  BUN 54*   < > 57*   < > 54*   < > 46* 47*  CREATININE 4.01*   < >  4.37*   < > 4.47*   < > 4.30* 4.31*  CALCIUM 10.5*   < > 10.7*  --  10.5*  --   --  11.4*  MG 3.1*  --  2.6*  --   --   --   --   --   PHOS 5.3*   < > 3.6  --  4.0  --   --  5.6*   < > = values in this interval not displayed.   Liver Function Tests Recent Labs    01/30/23 1558 01/31/23 0407  ALBUMIN 3.9 3.7   No results for input(s): "LIPASE", "AMYLASE" in the last 72 hours. Cardiac Enzymes No results for input(s): "CKTOTAL", "CKMB", "CKMBINDEX", "TROPONINI" in the last 72 hours.  BNP: BNP (last 3 results) Recent Labs    05/22/22 0732 08/05/22 1720 01/21/23 1739  BNP 373.1* 15.5 1,528.6*    ProBNP (last 3 results) No results for input(s): "PROBNP" in the last 8760 hours.   D-Dimer No results for input(s): "DDIMER" in the last 72 hours. Hemoglobin A1C No results for input(s): "HGBA1C" in the last 72 hours. Fasting Lipid Panel Recent Labs    01/31/23 0407  TRIG 483*   Thyroid Function Tests No results for input(s): "TSH", "T4TOTAL", "T3FREE", "THYROIDAB" in the last 72 hours.  Invalid input(s): "FREET3"  Other  results:   Imaging    No results found.   Medications:     Scheduled Medications:  apixaban  5 mg Per Tube BID   Chlorhexidine Gluconate Cloth  6 each Topical Q0600   darbepoetin (ARANESP) injection - DIALYSIS  100 mcg Subcutaneous Q Mon-1800   docusate  100 mg Per Tube BID   feeding supplement (PROSource TF20)  60 mL Per Tube QID   fentaNYL (SUBLIMAZE) injection  50 mcg Intravenous Once   hydrOXYzine  10 mg Per Tube TID   insulin aspart  0-9 Units Subcutaneous Q4H   lidocaine  1 patch Transdermal Q24H   melatonin  5 mg Per Tube QHS   multivitamin  1 tablet Per Tube QHS   mouth rinse  15 mL Mouth Rinse Q2H   pantoprazole (PROTONIX) IV  40 mg Intravenous Q24H   polyethylene glycol  17 g Per Tube BID   senna-docusate  1 tablet Per Tube BID    Infusions:  sodium chloride Stopped (01/22/23 1859)   calcium gluconate 20 g in dextrose 5 % 1,000 mL infusion 50 mL/hr at 01/31/23 0700   citrate dextrose 250 mL/hr at 01/31/23 0700   fentaNYL infusion INTRAVENOUS 200 mcg/hr (01/31/23 0700)   linezolid (ZYVOX) IV Stopped (01/30/23 2232)   meropenem (MERREM) IV Stopped (01/31/23 0203)   midazolam 10 mg/hr (01/31/23 0700)   norepinephrine (LEVOPHED) Adult infusion 24 mcg/min (01/31/23 0700)   prismasol B22GK 4/0 400 mL/hr at 01/31/23 0242   prismasol BGK 4/2.5 1,500 mL/hr at 01/31/23 0438   propofol (DIPRIVAN) infusion Stopped (01/27/23 1719)   vasopressin 0.04 Units/min (01/31/23 0700)    PRN Medications: sodium chloride, acetaminophen **OR** acetaminophen, bisacodyl, fentaNYL, midazolam, ondansetron (ZOFRAN) IV, mouth rinse, polyvinyl alcohol, surgical lubricant    Patient Profile   37 y.o. male with hx of chronic HFrEF, PAF/AFL (multiple prior DCCVs, attempted ablation 12/23), concern for amiodarone lung toxicity treated with steroids, chronic respiratory failure on home O2, ESRD on iHD, OSA/OHS, morbid obesity, admitted w/ acute hypoxic respiratory failure requiring  intubation in setting of sepsis PNA, afib w/ RVR and a/c CHF in the setting of poor compliance w/  iHD. Started on CRRT. BCx + for strep pneumo   Assessment/Plan   1.  Acute on chronic systolic CHF--Cardiogenic Shock: Nonischemic CMP, LHC 2021 with no CAD. Previous hospitalizations w/shock in setting of rapid AFib/Flutter requiring milrinone. Most recent echo 5/24 was unchanged with EF 20-25%, RV difficult to visualize.  He signs off early from HD and was significantly volume overloaded this admission. Suspect combination cardiogenic and septic shock at this time.  Currently on vasopressin 0.04 units,+ Norepi 24 and midodrine 15 tid.   CRRT running even. Remains in A fib RVR.  - Not candidate for advanced therapies or mechanical support.  2. Atrial flutter/fibrillation: History of multiple DCCVs. Amiodarone previously stopped due to elevated LFTs. Restarted 05/20/22 by Dr. Elberta Fortis.  Concern for amiodarone pulmonary toxicity w/ elevated ESR and CRP 11/23. He has been off amiodarone and was treated with steroids.  AF [facility] at [facility] in 12/23. He was in NSR at admission but went into atrial flutter with RVR on 7/3. Had urgent cardioversion x 3 on 7/4 but failed to convert. Successful cardioversion on 7/5.  Back in AF with RVR early am 7/6, amiodarone was restarted (understanding he has had pulmonary and liver toxicity in past) as last ditch effort to try to get him back in NSR.  Now has failed DCCV x 3 early am and DCCV x 3 by Dr Shirlee Latch.  Remains in A fib RVR 140-160s.   - No longer on amiodarone. (lung and hepatic toxicity) or Tikosyn (ESRD), no long-term anti-arrhythmic option.  -  Continue Eliquis 5 mg bid  - If we are unable to slow his rhythm, doubt he can survive.  3. History of amiodarone lung toxicity: Seen by pulmonary during admit 11/23. Had elevated ESR and sed rate. Treated with prednisone.   4. ESRD: HD per nephrology, on T,Th,Sat schedule.  Poor compliance with chronic treatment  (signs off early). Significantly volume overloaded at admission. Weight down with CRRT. CVP 4 today.  - CVVH running even with worsening pressor requirement.  5. ID/Bacteremia: Fever at admission.  Suspect PNA. BCx + for Strep pneumoniae.  Now WBCs up to 22K and fever to 103 t - Panculture- No growth.  - Broaden abx to linezolid + meropenem.  6. OHS/OSA: Has been on CPAP and home oxygen.  7. Lower extremity wounds: - Per primary  - Wound consulted 8. Acute on chronic hypoxemic respiratory failure: Baseline OHS/OSA, now suspect PNA and CHF/volume overload. Intubated.  - Antibiotics as above.  - CRRT 9. GOC: Palliative care has seen and spoken with family.  They have wanted full code.  Yesterday Dr Shirlee Latch spoke with his mother . No family at bedside today. He is quickly running out of time.   Yesterday [facility] called for transfer. He was not accepted for transfer because he has no options.   Marshal Schrecengost NP-C  01/31/2023 7:48 AM

## 2023-01-31 NOTE — Progress Notes (Addendum)
[facility] Progress Note  Subjective: seen in ICU bed, sedated on the vent  Vitals:   01/31/23 1500 01/31/23 1515 01/31/23 1531 01/31/23 1554  BP:    (!) 108/54  Pulse: (!) 111 85  83  Resp: (!) 24 (!) 24  (!) 24  Temp:   98.1 F (36.7 C)   TempSrc:   Axillary   SpO2: 100% 100%  100%  Weight:      Height:        Exam: General: Ill looking male, intubated, sedated, CVP around 4 Heart: A-fib with RVR, s1s2 nl Lungs: Coarse breath sound bilateral Abdomen:soft, Non-tender, non-distended Extremities: mild bilat LE edema Dialysis Access: RIJ TDC in place.  Site clean.    OP HD: TTS [facility]  4h  425/ 1.5  3K/2.5 bath   180kg  TDC   - mircera 200 mcg q 2 wks, last 6/13 - sensipar 60mg  three times per week - hectorol 4 mcg three times per week - doesn't stay on for full sessions, doesn't adhere to fluid restriction  CXR 7/06- showed vasc congestion, no edema CVP down to 4 Na 137  K 3.6  CO2 24   BUN 47  creat 4.31  Ca 11.4 (up)  phos 5.6   Assessment/ Plan: ESRD: Required emergent HD on 6/29 because of fluid overload.  Then developed shock requiring high doses of Levophed (30) and Vaso for hypotension/shock and afib w/ RVR.  Started CRRT on 6/29 with initial UF goal 100-150 cc/hr initially and then lowered to net neg 50 cc/hr. Had to start citrate protocol with filter clotting so often. CXR clear on 7/06 and net I/O's are negative 7 liters. Will change UF to keep + 50 cc/hr (should approximate equal I/O's).  Acute on chronic systolic CHF, nonischemic: EF 16-10. Per cardiology team pt is not a candidate for ICD and advanced therapies for CHF because of obesity and dialysis.  Sepsis/septic shock: w/ strep pneumo bacteremia. On linezolid and meropenem. Per primary team.  Escalating doses of Levo gtt up to 24 mcg/ min, + vaso at 0.04.  Anemia of CKD: Hemoglobin at goal.  Received Aranesp on 7/1. Hypercalcemia: change dialysate from 4K/ 2.5 to B22K4/0Ca at 1500 cc/hr  and change post filter to BGK4/2.5.  A-fib with RVR with hypotension: S/P cardioversion 7/5 -> NSR -> afib RVR 7/6 early AM refractory to [facility] x3 + amiodarone.      Vinson Moselle MD  [facility] 01/31/2023, 4:07 PM  Recent Labs  Lab 01/30/23 1558 01/30/23 1946 01/31/23 0407 01/31/23 1013 01/31/23 1015  HGB  --    < > 13.5 15.6 16.0  ALBUMIN 3.9  --  3.7  --   --   CALCIUM 10.5*  --  11.4*  --   --   PHOS 4.0  --  5.6*  --   --   CREATININE 4.47*   < > 4.31* 4.30* 3.20*  K 3.5   < > 3.7 3.6 3.6   < > = values in this interval not displayed.   No results for input(s): "IRON", "TIBC", "FERRITIN" in the last 168 hours. Inpatient medications:  apixaban  5 mg Per Tube BID   Chlorhexidine Gluconate Cloth  6 each Topical Q0600   darbepoetin (ARANESP) injection - DIALYSIS  100 mcg Subcutaneous Q Mon-1800   docusate  100 mg Per Tube BID   feeding supplement (PROSource TF20)  60 mL Per Tube QID   fentaNYL (SUBLIMAZE) injection  50 mcg Intravenous  Once   hydrOXYzine  10 mg Per Tube TID   insulin aspart  0-9 Units Subcutaneous Q4H   lidocaine  1 patch Transdermal Q24H   melatonin  5 mg Per Tube QHS   multivitamin  1 tablet Per Tube QHS   mouth rinse  15 mL Mouth Rinse Q2H   pantoprazole (PROTONIX) IV  40 mg Intravenous Q24H   polyethylene glycol  17 g Per Tube BID   senna-docusate  1 tablet Per Tube BID    sodium chloride Stopped (01/22/23 1859)   calcium gluconate 20 g in dextrose 5 % 1,000 mL infusion 50 mL/hr at 01/31/23 1500   citrate dextrose 250 mL/hr at 01/31/23 1500   feeding supplement (VITAL 1.5 CAL)     fentaNYL infusion INTRAVENOUS 200 mcg/hr (01/31/23 1500)   linezolid (ZYVOX) IV Stopped (01/31/23 1145)   meropenem (MERREM) IV Stopped (01/31/23 0917)   midazolam 10 mg/hr (01/31/23 1500)   norepinephrine (LEVOPHED) Adult infusion 24 mcg/min (01/31/23 1500)   prismasol B22GK 4/0 400 mL/hr at 01/31/23 0242   prismasol BGK 4/2.5 1,500 mL/hr at 01/31/23 1143   propofol (DIPRIVAN)  infusion Stopped (01/27/23 1719)   vasopressin 0.04 Units/min (01/31/23 1500)   sodium chloride, acetaminophen **OR** acetaminophen, bisacodyl, fentaNYL, midazolam, ondansetron (ZOFRAN) IV, mouth rinse, polyvinyl alcohol, surgical lubricant

## 2023-01-31 NOTE — Progress Notes (Signed)
   Medical records reviewed including progress notes, labs and imaging. Patient remains critically ill with a grim prognosis. Per yesterday's GOC discussion, family still desires full code/full scope treatment.   Appreciate PMT RN assistance with attempt to support to family in this difficult time. Family not at bedside today.   PMT will follow peripherally at this time. Will continue to support intermittently and continue GOC discussions as appropriate. They have been encouraged to reach out with additional needs/concerns.     Thank you for your referral and allowing PMT to assist in [name] care.   Phillip Young, [facility] Team  Team Phone # [phone]   NO CHARGE

## 2023-01-31 NOTE — Progress Notes (Signed)
Nutrition Follow-up  DOCUMENTATION CODES:   Morbid obesity  INTERVENTION:   Tube Feeding via Cortrak:  Resume trickle TF, Vital 1.5 at 20 ml/hr Continue Pro-Source TF20 60 mL QID Goal TF rate: Vital 1.5 at 55 ml/hr TF regimen at goal provides 2300 kcals, 1689 g of protein and 1003 mL of free water   Still no BM (since 6/28), recommend further adjustment with regards to bowel regimen. Enema/Suppository and/or pro-motility agent; discussed with PCCM  Pt receiving additional calories in form of dextrose via calcium dextrose (ACD-A anticoagulant) for CRRT  Continue Renal MVI daily  NUTRITION DIAGNOSIS:   Inadequate oral intake related to inability to eat as evidenced by NPO status.  Being addressed via TF  GOAL:   Patient will meet greater than or equal to 90% of their needs  Progressing  MONITOR:   Vent status, TF tolerance, Labs, Weight trends  REASON FOR ASSESSMENT:   Ventilator, Consult Assessment of nutrition requirement/status  ASSESSMENT:   37 year old male with PMHx of ESRD on HD, nonischemic cardiomyopathy, refractory A-fib, possible amio lung toxicity admitted with acute on chronic hypoxmic respiratory failure in setting of virus and missed HD, probable sepsis.  6/29: pt transferred to ICU, plan to start CRRT, later intubated    Pt remains on vent support, remains on CRRT currently running even Levophed stable at 24 (down from yesterday) Vasopressin 0.04  Noted family requested transfer to [facility]; pt denied transfer  TF discontinued yesterday due to increasing pressor requirements, hemodynamic instability, ?vomiting. Discussed with PCCM today and plan to resume TF at trickle today Constipation persists with no documented BM since 6/28  Serum calcium 11.4 but ionized calcium 0.4-1.02. Pt on calcium dextrose (ACD-A anticoagulant) with CRRT due to clotting issues  Noted EDW 180 kg, current wt 174.4 kg  Labs: phosphorus 5.6 (H) Meds: ss novolog, rena-vite,  ss novolog, senna-docusate, miralax, colace   Diet Order:   Diet Order     None       EDUCATION NEEDS:   No education needs have been identified at this time  Skin:  Skin Assessment: Reviewed RN Assessment  Last BM:  01/21/23 per chart  Height:   Ht Readings from Last 1 Encounters:  01/21/23 6' (1.829 m)    Weight:   Wt Readings from Last 1 Encounters:  01/29/23 (!) 174.4 kg    Ideal Body Weight:  80.9 kg  BMI:  Body mass index is 52.15 kg/m.  Estimated Nutritional Needs:   Kcal:  2100-2300 kcals  Protein:  160-190 g  Fluid:  UOP + 1 L   .Romelle Starcher MS, RDN, LDN, CNSC Registered Dietitian 3 Clinical Nutrition RD Pager and On-Call Pager Number Located in [place]

## 2023-02-01 ENCOUNTER — Inpatient Hospital Stay (HOSPITAL_COMMUNITY): Payer: BC Managed Care – PPO

## 2023-02-01 DIAGNOSIS — J9601 Acute respiratory failure with hypoxia: Secondary | ICD-10-CM | POA: Diagnosis not present

## 2023-02-01 LAB — POCT I-STAT, CHEM 8
BUN: 36 mg/dL — ABNORMAL HIGH (ref 6–20)
BUN: 41 mg/dL — ABNORMAL HIGH (ref 6–20)
BUN: 35 mg/dL — ABNORMAL HIGH (ref 6–20)
BUN: 40 mg/dL — ABNORMAL HIGH (ref 6–20)
BUN: 35 mg/dL — ABNORMAL HIGH (ref 6–20)
BUN: 40 mg/dL — ABNORMAL HIGH (ref 6–20)
Calcium, Ion: 0.34 mmol/L — CL (ref 1.15–1.40)
Calcium, Ion: 1.01 mmol/L — ABNORMAL LOW (ref 1.15–1.40)
Calcium, Ion: 0.34 mmol/L — CL (ref 1.15–1.40)
Calcium, Ion: 1.05 mmol/L — ABNORMAL LOW (ref 1.15–1.40)
Calcium, Ion: 0.4 mmol/L — CL (ref 1.15–1.40)
Calcium, Ion: 1.1 mmol/L — ABNORMAL LOW (ref 1.15–1.40)
Chloride: 94 mmol/L — ABNORMAL LOW (ref 98–111)
Chloride: 95 mmol/L — ABNORMAL LOW (ref 98–111)
Chloride: 94 mmol/L — ABNORMAL LOW (ref 98–111)
Chloride: 99 mmol/L (ref 98–111)
Chloride: 100 mmol/L (ref 98–111)
Chloride: 94 mmol/L — ABNORMAL LOW (ref 98–111)
Creatinine, Ser: 3 mg/dL — ABNORMAL HIGH (ref 0.61–1.24)
Creatinine, Ser: 4.4 mg/dL — ABNORMAL HIGH (ref 0.61–1.24)
Creatinine, Ser: 2.9 mg/dL — ABNORMAL HIGH (ref 0.61–1.24)
Creatinine, Ser: 4.1 mg/dL — ABNORMAL HIGH (ref 0.61–1.24)
Creatinine, Ser: 3 mg/dL — ABNORMAL HIGH (ref 0.61–1.24)
Creatinine, Ser: 4 mg/dL — ABNORMAL HIGH (ref 0.61–1.24)
Glucose, Bld: 150 mg/dL — ABNORMAL HIGH (ref 70–99)
Glucose, Bld: 190 mg/dL — ABNORMAL HIGH (ref 70–99)
Glucose, Bld: 151 mg/dL — ABNORMAL HIGH (ref 70–99)
Glucose, Bld: 194 mg/dL — ABNORMAL HIGH (ref 70–99)
Glucose, Bld: 151 mg/dL — ABNORMAL HIGH (ref 70–99)
Glucose, Bld: 192 mg/dL — ABNORMAL HIGH (ref 70–99)
HCT: 42 % (ref 39.0–52.0)
HCT: 47 % (ref 39.0–52.0)
HCT: 42 % (ref 39.0–52.0)
HCT: 46 % (ref 39.0–52.0)
HCT: 41 % (ref 39.0–52.0)
HCT: 45 % (ref 39.0–52.0)
Hemoglobin: 14.3 g/dL (ref 13.0–17.0)
Hemoglobin: 16 g/dL (ref 13.0–17.0)
Hemoglobin: 14.3 g/dL (ref 13.0–17.0)
Hemoglobin: 15.6 g/dL (ref 13.0–17.0)
Hemoglobin: 13.9 g/dL (ref 13.0–17.0)
Hemoglobin: 15.3 g/dL (ref 13.0–17.0)
Potassium: 3.7 mmol/L (ref 3.5–5.1)
Potassium: 3.8 mmol/L (ref 3.5–5.1)
Potassium: 3.7 mmol/L (ref 3.5–5.1)
Potassium: 3.8 mmol/L (ref 3.5–5.1)
Potassium: 3.7 mmol/L (ref 3.5–5.1)
Potassium: 3.8 mmol/L (ref 3.5–5.1)
Sodium: 135 mmol/L (ref 135–145)
Sodium: 137 mmol/L (ref 135–145)
Sodium: 136 mmol/L (ref 135–145)
Sodium: 138 mmol/L (ref 135–145)
Sodium: 136 mmol/L (ref 135–145)
Sodium: 137 mmol/L (ref 135–145)
TCO2: 26 mmol/L (ref 22–32)
TCO2: 27 mmol/L (ref 22–32)
TCO2: 28 mmol/L (ref 22–32)
TCO2: 28 mmol/L (ref 22–32)
TCO2: 27 mmol/L (ref 22–32)
TCO2: 29 mmol/L (ref 22–32)

## 2023-02-01 LAB — COOXEMETRY PANEL
Carboxyhemoglobin: 1.8 % — ABNORMAL HIGH (ref 0.5–1.5)
Methemoglobin: <0.7 % (ref 0.0–1.5)
O2 Saturation: 62 %
Total hemoglobin: 13.3 g/dL (ref 12.0–16.0)

## 2023-02-01 LAB — CBC
HCT: 39.5 % (ref 39.0–52.0)
Hemoglobin: 12.6 g/dL — ABNORMAL LOW (ref 13.0–17.0)
MCH: 32 pg (ref 26.0–34.0)
MCHC: 31.9 g/dL (ref 30.0–36.0)
MCV: 100.3 fL — ABNORMAL HIGH (ref 80.0–100.0)
Platelets: 183 10*3/uL (ref 150–400)
RBC: 3.94 MIL/uL — ABNORMAL LOW (ref 4.22–5.81)
RDW: 17.4 % — ABNORMAL HIGH (ref 11.5–15.5)
WBC: 23.5 10*3/uL — ABNORMAL HIGH (ref 4.0–10.5)
nRBC: 0.5 % — ABNORMAL HIGH (ref 0.0–0.2)

## 2023-02-01 LAB — GLUCOSE, CAPILLARY
Glucose-Capillary: 133 mg/dL — ABNORMAL HIGH (ref 70–99)
Glucose-Capillary: 138 mg/dL — ABNORMAL HIGH (ref 70–99)
Glucose-Capillary: 140 mg/dL — ABNORMAL HIGH (ref 70–99)
Glucose-Capillary: 146 mg/dL — ABNORMAL HIGH (ref 70–99)
Glucose-Capillary: 149 mg/dL — ABNORMAL HIGH (ref 70–99)
Glucose-Capillary: 157 mg/dL — ABNORMAL HIGH (ref 70–99)
Glucose-Capillary: 162 mg/dL — ABNORMAL HIGH (ref 70–99)

## 2023-02-01 LAB — RENAL FUNCTION PANEL
Albumin: 3.4 g/dL — ABNORMAL LOW (ref 3.5–5.0)
Albumin: 3.3 g/dL — ABNORMAL LOW (ref 3.5–5.0)
Anion gap: 23 — ABNORMAL HIGH (ref 5–15)
Anion gap: 19 — ABNORMAL HIGH (ref 5–15)
BUN: 40 mg/dL — ABNORMAL HIGH (ref 6–20)
BUN: 40 mg/dL — ABNORMAL HIGH (ref 6–20)
CO2: 25 mmol/L (ref 22–32)
CO2: 23 mmol/L (ref 22–32)
Calcium: 11 mg/dL — ABNORMAL HIGH (ref 8.9–10.3)
Calcium: 10.8 mg/dL — ABNORMAL HIGH (ref 8.9–10.3)
Chloride: 87 mmol/L — ABNORMAL LOW (ref 98–111)
Chloride: 93 mmol/L — ABNORMAL LOW (ref 98–111)
Creatinine, Ser: 3.99 mg/dL — ABNORMAL HIGH (ref 0.61–1.24)
Creatinine, Ser: 3.78 mg/dL — ABNORMAL HIGH (ref 0.61–1.24)
GFR, Estimated: 19 mL/min — ABNORMAL LOW (ref >60)
GFR, Estimated: 20 mL/min — ABNORMAL LOW (ref >60)
Glucose, Bld: 143 mg/dL — ABNORMAL HIGH (ref 70–99)
Glucose, Bld: 150 mg/dL — ABNORMAL HIGH (ref 70–99)
Phosphorus: 6.5 mg/dL — ABNORMAL HIGH (ref 2.5–4.6)
Phosphorus: 5.4 mg/dL — ABNORMAL HIGH (ref 2.5–4.6)
Potassium: 3.7 mmol/L (ref 3.5–5.1)
Potassium: 3.9 mmol/L (ref 3.5–5.1)
Sodium: 135 mmol/L (ref 135–145)
Sodium: 135 mmol/L (ref 135–145)

## 2023-02-01 LAB — TRIGLYCERIDES: Triglycerides: 436 mg/dL — ABNORMAL HIGH (ref <150)

## 2023-02-01 LAB — CALCIUM, IONIZED: Calcium, Ionized, Serum: 4.8 mg/dL (ref 4.5–5.6)

## 2023-02-01 LAB — MAGNESIUM: Magnesium: 1.9 mg/dL (ref 1.7–2.4)

## 2023-02-01 MED ORDER — VITAL 1.5 CAL PO LIQD: 1000.0000 mL | ORAL | Status: DC

## 2023-02-01 MED ORDER — VITAL 1.5 CAL PO LIQD
1000.0000 mL | ORAL | Status: DC
  Administered 2023-02-01 – 2023-02-26 (×23): 1000 mL
  Filled 2023-02-01 (×6): qty 1000

## 2023-02-01 MED ORDER — PROSOURCE TF20 ENFIT COMPATIBL EN LIQD
60.0000 mL | ENTERAL | Status: DC
  Administered 2023-02-01 – 2023-03-02 (×158): 60 mL
  Filled 2023-02-01 (×140): qty 60

## 2023-02-01 NOTE — Progress Notes (Addendum)
[facility] Progress Note  Subjective: seen in ICU bed, sedated on the vent. I/O's not accurate from yesterday. Wt's are down 10kg from prior dry wt. CVP 10-14 overnight.   Vitals:   02/01/23 0930 02/01/23 0945 02/01/23 1000 02/01/23 1015  BP:   101/81   Pulse: 93 (!) 111 99 (!) 109  Resp: (!) 28 (!) 28 (!) 29 (!) 30  Temp:      TempSrc:      SpO2: 100% 100% 100% 100%  Weight:      Height:        Exam: General: Ill looking male, intubated, sedated Heart: A-fib with RVR, s1s2 nl Lungs: Coarse breath sound bilateral Abdomen:soft, Non-tender, non-distended Extremities: no sig LE or UE edema Dialysis Access: RIJ TDC in place.  Site clean.    OP HD: TTS [facility]  4h  425/ 1.5  3K/2.5 bath   180kg  TDC   - mircera 200 mcg q 2 wks, last 6/13 - sensipar 60mg  three times per week - hectorol 4 mcg three times per week - doesn't stay on for full sessions, doesn't adhere to fluid restriction  CXR 7/06- showed vasc congestion, no edema  Today -->  K+ 3.7  CO2 25 (23 yest)  Ca 11.0 (12.0 yest)  phos 6.5   Assessment/ Plan: ESRD: Required emergent HD on 6/29 because of fluid overload.  Then developed shock requiring high doses of pressors for hypotension/shock and afib w/ RVR.  Started CRRT on 6/29. Had to start citrate protocol with filter clotting so often. This is D#9 of CRRT.  Continue for now.  Volume:  6/29- 7/3 on CRRT was net neg 19.3 L over 5 days. CXR clear on 7/06 and net I/O's are negative 7 liters since admit. Wts are down 10kg below dry wt. CVP's up today. Will change UF to keep even.  Acute on chronic systolic CHF, nonischemic: EF 16-10. Per cardiology team pt is not a candidate for ICD and advanced therapies for CHF because of obesity and dialysis.  Sepsis/septic shock: w/ strep pneumo bacteremia initially w/ presumed PNA. On IV meropenem now. Per primary team.  Escalating doses of Levo gtt up to 30 mcg/ min, + vaso at 0.04.  Anemia of CKD: Hemoglobin at  goal.  Received Aranesp on 7/1. Hypercalcemia: dialysate B22K4/0Ca at 1500 cc/hr (for citrate pts this has low bicarb and no Ca++). Ca++ still high but down a bit today. Follow.  A-fib with RVR with hypotension: S/P cardioversion 7/5 -> NSR -> afib RVR 7/6 early AM refractory to [facility] x3 + amiodarone.      Vinson Moselle MD  [facility] 02/01/2023, 10:31 AM  Recent Labs  Lab 01/31/23 1625 01/31/23 1814 02/01/23 0103 02/01/23 0107 02/01/23 0430  HGB  --    < > 16.0 14.3  --   ALBUMIN 3.6  --   --   --  3.4*  CALCIUM 12.0*  --   --   --  11.0*  PHOS 5.7*  --   --   --  6.5*  CREATININE 3.95*   < > 3.00* 4.40* 3.99*  K 3.6   < > 3.8 3.7 3.7   < > = values in this interval not displayed.    No results for input(s): "IRON", "TIBC", "FERRITIN" in the last 168 hours. Inpatient medications:  apixaban  5 mg Per Tube BID   Chlorhexidine Gluconate Cloth  6 each Topical Q0600   darbepoetin (ARANESP) injection - DIALYSIS  100  mcg Subcutaneous Q Mon-1800   docusate  100 mg Per Tube BID   feeding supplement (PROSource TF20)  60 mL Per Tube QID   fentaNYL (SUBLIMAZE) injection  50 mcg Intravenous Once   hydrOXYzine  10 mg Per Tube TID   insulin aspart  0-9 Units Subcutaneous Q4H   lidocaine  1 patch Transdermal Q24H   melatonin  5 mg Per Tube QHS   multivitamin  1 tablet Per Tube QHS   mouth rinse  15 mL Mouth Rinse Q2H   pantoprazole (PROTONIX) IV  40 mg Intravenous Q24H   polyethylene glycol  17 g Per Tube TID   senna-docusate  2 tablet Per Tube BID     prismasol BGK 4/2.5 400 mL/hr at 02/01/23 0643   sodium chloride Stopped (01/22/23 1859)   calcium gluconate 20 g in dextrose 5 % 1,000 mL infusion 50 mL/hr at 02/01/23 1000   citrate dextrose 250 mL/hr at 02/01/23 1000   feeding supplement (VITAL 1.5 CAL) 20 mL/hr at 02/01/23 1000   fentaNYL infusion INTRAVENOUS 50 mcg/hr (02/01/23 1000)   meropenem (MERREM) IV 1 g (02/01/23 1009)   midazolam 5 mg/hr (02/01/23 1000)   norepinephrine  (LEVOPHED) Adult infusion 30 mcg/min (02/01/23 1000)   prismasol B22GK 4/0 1,500 mL/hr at 02/01/23 0409   prismasol BGK 4/2.5 1,500 mL/hr at 01/31/23 1143   vasopressin 0.04 Units/min (02/01/23 1000)   sodium chloride, acetaminophen **OR** acetaminophen, bisacodyl, fentaNYL, midazolam, ondansetron (ZOFRAN) IV, mouth rinse, polyvinyl alcohol, surgical lubricant

## 2023-02-01 NOTE — Progress Notes (Signed)
Patient ID: Phillip Young, male   DOB: 1985/12/08, 37 y.o.   MRN: [id number]     Advanced Heart Failure Rounding Note  PCP-Cardiologist: Marca Ancona, MD   Subjective:   7/4: Back in A fib RVR and underwent urgent cardioversion x3 but failed to convert. Pressor requirements increased. Vaso added and Norepi increased.  7/5: Successful DCCV to NSR.  7/7: Atrial fibrillation with RVR recurred overnight, failed DCCV x 3, started on amiodarone gtt. Failed multiple cardioversions. Amio stopped again. No benefit.   Remains intubated, sedated. Tm 99.6  this morning.  On meropneum.   7/7 Bld CX - negative.   CVVH running even, CVP   Remains on NE 30 mcg + vaso 0.04 units+ midodrine 15 mg tid. CO-OX 56%.   Remains intubated/sedate in A fib RVR. Marland Kitchen Earlier this morning sedation cut back but heart rate went up to 180s.   Objective:   Weight Range: (!) 169.8 kg Body mass index is 50.77 kg/m.   Vital Signs:   Temp:  [98.1 F (36.7 C)-99.6 F (37.6 C)] 98.3 F (36.8 C) (07/09 0733) Pulse Rate:  [71-154] 154 (07/09 0750) Resp:  [21-35] 27 (07/09 0750) BP: (101-143)/(54-130) 101/73 (07/09 0750) SpO2:  [95 %-100 %] 100 % (07/09 0750) Arterial Line BP: (63-126)/(48-75) 109/62 (07/09 0715) FiO2 (%):  [40 %] 40 % (07/09 0750) Weight:  [169.8 kg] 169.8 kg (07/09 0453) Last BM Date : 01/21/23  Weight change: Filed Weights   01/28/23 0500 01/29/23 0500 02/01/23 0453  Weight: (!) 173.3 kg (!) 174.4 kg (!) 169.8 kg    Intake/Output:   Intake/Output Summary (Last 24 hours) at 02/01/2023 0801 Last data filed at 02/01/2023 0700 Gross per 24 hour  Intake 7117.94 ml  Output 2005.3 ml  Net 5112.64 ml     CVP 8  Physical Exam  General:  Intubated on CRRT HEENT: ETT Neck: supple. JVP difficult to assess due to body habitus. Carotids 2+ bilat; no bruits. No lymphadenopathy or thryomegaly appreciated. Cor: PMI nondisplaced. Regular rate & rhythm. No rubs, gallops or murmurs. Lungs:  clear Abdomen: obese, soft, nontender, nondistended. No hepatosplenomegaly. No bruits or masses. Good bowel sounds. Extremities: no cyanosis, clubbing, rash, edema Neuro: Intubated/sedated.  Telemetry   A fib 140-180s   EKG    N/A   Labs    CBC Recent Labs    01/30/23 0317 01/30/23 0325 01/31/23 0407 01/31/23 1013 02/01/23 0103 02/01/23 0107  WBC 20.0*  --  22.2*  --   --   --   NEUTROABS 13.9*  --  17.4*  --   --   --   HGB 14.0   < > 13.5   < > 16.0 14.3  HCT 43.6   < > 41.7   < > 47.0 42.0  MCV 101.2*  --  101.7*  --   --   --   PLT 327  --  233  --   --   --    < > = values in this interval not displayed.   Basic Metabolic Panel Recent Labs    69/62/95 0317 01/30/23 0325 01/31/23 1625 01/31/23 1814 02/01/23 0107 02/01/23 0430  NA 133*   < > 135   < > 135 135  K 4.4   < > 3.6   < > 3.7 3.7  CL 91*   < > 87*   < > 95* 87*  CO2 19*   < > 23  --   --  25  GLUCOSE 201*   < > 143*   < > 150* 143*  BUN 57*   < > 41*   < > 41* 40*  CREATININE 4.37*   < > 3.95*   < > 4.40* 3.99*  CALCIUM 10.7*   < > 12.0*  --   --  11.0*  MG 2.6*  --   --   --   --  1.9  PHOS 3.6   < > 5.7*  --   --  6.5*   < > = values in this interval not displayed.   Liver Function Tests Recent Labs    01/31/23 1625 02/01/23 0430  ALBUMIN 3.6 3.4*   No results for input(s): "LIPASE", "AMYLASE" in the last 72 hours. Cardiac Enzymes No results for input(s): "CKTOTAL", "CKMB", "CKMBINDEX", "TROPONINI" in the last 72 hours.  BNP: BNP (last 3 results) Recent Labs    05/22/22 0732 08/05/22 1720 01/21/23 1739  BNP 373.1* 15.5 1,528.6*    ProBNP (last 3 results) No results for input(s): "PROBNP" in the last 8760 hours.   D-Dimer No results for input(s): "DDIMER" in the last 72 hours. Hemoglobin A1C No results for input(s): "HGBA1C" in the last 72 hours. Fasting Lipid Panel Recent Labs    02/01/23 0430  TRIG 436*   Thyroid Function Tests No results for input(s): "TSH",  "T4TOTAL", "T3FREE", "THYROIDAB" in the last 72 hours.  Invalid input(s): "FREET3"  Other results:   Imaging    No results found.   Medications:     Scheduled Medications:  apixaban  5 mg Per Tube BID   Chlorhexidine Gluconate Cloth  6 each Topical Q0600   darbepoetin (ARANESP) injection - DIALYSIS  100 mcg Subcutaneous Q Mon-1800   docusate  100 mg Per Tube BID   feeding supplement (PROSource TF20)  60 mL Per Tube QID   fentaNYL (SUBLIMAZE) injection  50 mcg Intravenous Once   hydrOXYzine  10 mg Per Tube TID   insulin aspart  0-9 Units Subcutaneous Q4H   lidocaine  1 patch Transdermal Q24H   melatonin  5 mg Per Tube QHS   multivitamin  1 tablet Per Tube QHS   mouth rinse  15 mL Mouth Rinse Q2H   pantoprazole (PROTONIX) IV  40 mg Intravenous Q24H   polyethylene glycol  17 g Per Tube TID   senna-docusate  2 tablet Per Tube BID    Infusions:   prismasol BGK 4/2.5 400 mL/hr at 02/01/23 0643   sodium chloride Stopped (01/22/23 1859)   calcium gluconate 20 g in dextrose 5 % 1,000 mL infusion 50 mL/hr at 02/01/23 0700   citrate dextrose 250 mL/hr at 02/01/23 0700   feeding supplement (VITAL 1.5 CAL) 20 mL/hr at 02/01/23 0700   fentaNYL infusion INTRAVENOUS 50 mcg/hr (02/01/23 0700)   meropenem (MERREM) IV Stopped (02/01/23 0151)   midazolam 2 mg/hr (02/01/23 0700)   norepinephrine (LEVOPHED) Adult infusion 30 mcg/min (02/01/23 0700)   prismasol B22GK 4/0 1,500 mL/hr at 02/01/23 0409   prismasol BGK 4/2.5 1,500 mL/hr at 01/31/23 1143   vasopressin 0.04 Units/min (02/01/23 0700)    PRN Medications: sodium chloride, acetaminophen **OR** acetaminophen, bisacodyl, fentaNYL, midazolam, ondansetron (ZOFRAN) IV, mouth rinse, polyvinyl alcohol, surgical lubricant    Patient Profile   37 y.o. 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 Started on CRRT. BCx + for strep pneumo   Assessment/Plan   1.  Acute on chronic systolic CHF--Cardiogenic Shock: Nonischemic CMP, LHC 2021 with no CAD. Previous hospitalizations w/shock in setting of rapid AFib/Flutter requiring milrinone. Most recent echo 5/24 was unchanged with EF 20-25%, RV difficult to visualize.  He signs off early from HD and was significantly volume overloaded this admission. Suspect combination cardiogenic and septic shock at this time.  Currently on vasopressin 0.04 units,+ Norepi 30 and midodrine 15 tid.   CRRT running even. Remains in A fib RVR.  - Not candidate for advanced therapies or mechanical support.  2. Atrial flutter/fibrillation: History of multiple DCCVs. Amiodarone previously stopped due to elevated LFTs. Restarted 05/20/22 by Dr. Elberta Fortis.  Concern for amiodarone pulmonary toxicity w/ elevated ESR and CRP 11/23. He has been off amiodarone and was treated with steroids.  AF ablation at [facility] in 12/23. He was in NSR at admission but went into atrial flutter with RVR on 7/3. Had urgent cardioversion x 3 on 7/4 but failed to convert. Successful cardioversion on 7/5.  Back in AF with RVR early am 7/6, amiodarone was restarted (understanding he has had pulmonary and liver toxicity in past) as last ditch effort to try to get him back in NSR.  Now has failed DCCV x 3 early am and DCCV x 3 by Dr Shirlee Latch.  Remains in A fib RVR 140-180s.    - No longer on amiodarone. (lung and hepatic toxicity) or Tikosyn (ESRD), no long-term anti-arrhythmic option.  -  Continue Eliquis 5 mg bid  - If we are unable to slow his rhythm, doubt he can survive.  3. History of amiodarone lung toxicity: Seen by pulmonary during admit 11/23. Had elevated ESR and sed rate. Treated with prednisone.    4. ESRD: HD per nephrology, on T,Th,Sat schedule.  Poor compliance with chronic treatment (signs off early). Significantly volume overloaded at admission. Weight down with CRRT. CVP 8  today.  - CVVH running even with worsening pressor requirement.  5. ID/Bacteremia: Fever at admission.  Suspect PNA. BCx + for Strep pneumoniae.  Now Check CBC.  - Panculture- No growth.  - Currently on + meropenem.  6. OHS/OSA: Has been on CPAP and home oxygen.  7. Lower extremity wounds: - Per primary  - Wound consulted 8. Acute on chronic hypoxemic respiratory failure: Baseline OHS/OSA, now suspect PNA and CHF/volume overload. Remains Intubated.  - Antibiotics as above.  - CRRT 9. GOC: Palliative care has seen and spoken with family.  They have wanted full code.  Yesterday Dr Shirlee Latch spoke with his mother . No family at bedside today. He is quickly running out of time. No family present.   7/7  [facility] called for transfer. He was not accepted for transfer because he has no options.   Azure Budnick NP-C  02/01/2023 8:01 AM

## 2023-02-01 NOTE — Progress Notes (Signed)
NAME:  Laurier Jasperson, MRN:  [id number], DOB:  Feb 17, 1986, LOS: 11 ADMISSION DATE:  01/21/2023, CONSULTATION DATE:  01/22/23 REFERRING MD:  Danise Edge, CHIEF COMPLAINT:  SOB   History of Present Illness:  37 year old man well known to the PCCM and AHF services who presented to [facility] 6/28 with fevers, joint pains, SOB. PMHx significant for ESRD, NICM, refractory AFib, ?amio-induced lung toxicity. Patient reportedly missed HD on Thursday (has a history of leaving HD early). Reportedly ~10kg over dry weight with worsening breathing status, AFib/RVR and borderline BP.  PCCM consulted for ICU admission/further evaluation and management.  Pertinent Medical History:   Past Medical History:  Diagnosis Date   Acute on chronic respiratory failure with hypoxia ([name]) 04/21/2021   Acute on chronic systolic (congestive) heart failure ([name]) 02/26/2020   Amiodarone toxicity    Anemia    Atrial flutter ([name])    Biventricular congestive heart failure ([name])    Chronic hypoxemic respiratory failure ([name])    Class 3 severe obesity due to excess calories with serious comorbidity and body mass index (BMI) of 50.0 to 59.9 in adult [facility]) 02/26/2020   Essential hypertension 02/26/2020   GERD without esophagitis 02/26/2020   Hidradenitis suppurativa 02/26/2020   NICM (nonischemic cardiomyopathy) ([name])    Obesity hypoventilation syndrome ([name])    OSA (obstructive sleep apnea)    PAF (paroxysmal atrial fibrillation) ([name])    Pneumonia    Prediabetes 02/26/2020   Significant Hospital Events: Including procedures, antibiotic start and stop dates in addition to other pertinent events   6/28 Admit, emergent HD 6/29 Afib/RVR, shock, ICU transfer 6/30 Intubated and placed on vasopressor support and started on CRRT 7/1 Remains intubated on CRRT continue with pressor support 7/2 Agitated with SAT, remains on CRRT and low dose levophed 7/3 low dose NE, ongoing CRRT 7/4 Attempted SBT this am, apneic but severely agitated,  desaturated, HR up 130/140s.  Re-sedated but HR remains 140's, NE up from 6 to . NSR on EKG.  Was pulling 50-100on UF, since kept even for now.  CVP 8 7/5 DCCV again attempted this a.m. with success, remains on norepinephrine and vasopressin.  7/6 went into A-fib with RVR, attempted DCCV x 3, started on amiodarone infusion after amiodarone bolus 7/7 Multiple DCCV attempts without success. TF held due to vomiting.  7/8 KUB looked OK. No further vomiting. Tolerating TF. Large BM overnight.   Interim History / Subjective:  Vasopressors back up today Remains rapid AF Still on CRRT keeping even BM this morning.  Failed WUA this morning due to coughing leading to hemodynamic instability.   Objective:  Blood pressure 115/83, pulse (!) 103, temperature 98.3 F (36.8 C), temperature source Oral, resp. rate (!) 30, height 6' (1.829 m), weight (!) 169.8 kg, SpO2 100 %. CVP:  [4 mmHg-14 mmHg] 14 mmHg  Vent Mode: PRVC FiO2 (%):  [40 %] 40 % Set Rate:  [24 bmp] 24 bmp Vt Set:  [600 mL-610 mL] 600 mL PEEP:  [5 cmH20] 5 cmH20 Plateau Pressure:  [24 cmH20-25 cmH20] 24 cmH20   Intake/Output Summary (Last 24 hours) at 02/01/2023 1300 Last data filed at 02/01/2023 1200 Gross per 24 hour  Intake 5618.03 ml  Output 2125.3 ml  Net 3492.73 ml    Filed Weights   01/28/23 0500 01/29/23 0500 02/01/23 0453  Weight: (!) 173.3 kg (!) 174.4 kg (!) 169.8 kg   Physical Examination: General: morbidly obese on vent. Critically ill. HEENT: ETT in place. [place]/AT, PERRL Neuro: Sedated RASS -4 Chest:  Distant Heart:IRIR, tachy, rates 140s to 180s.  Abdomen: Soft, NT, ND Skin: Grossly intact   Resolved Hospital Problem List:  Hematuria due to traumatic Foley insertion  Assessment & Plan:  Acute-on-chronic hypoxemic respiratory failure Bilateral multifocal pneumonia Pulmonary edema due to noncompliance with hemodialysis/AonC HF OHS/OSA, noncompliant with CPAP - Continue lung protective ventilation - VAP  prevention bundle in place - PAD protocol with fentanyl and Versed with RASS goal -1 to -2 - Daily WUA have not been going well due to coughing/HR spikes and BP drop, but mother would like to be able to communicate with him.  Combined cardiogenic and septic shock due to strep pneumo bacteremia, POA - BCx grew strep pneumo, likely secondary to bilateral lower lobe pneumonia. - NE, vaso for MAP goal 65 - Broadened to linezolid and meropenem with worsening pressor demand 7/7 > narrowed to meropenem alone with negative MRSA PCR - Repeat BC pending NGTD  - Appreciate heart failure team recommendations.   Acute on chronic HFrEF Nonischemic cardiomyopathy Echocardiogram 5/24 with unchanged EF of 20 to 25% Paroxysmal rapid A-fib/flutter with RVR - History of multiple DCCVs with history of amiodarone toxicity treated with steroids - Had to be started on 7/7 after bolus, DC yesterday at family request.  - Unfortunately patient heart rate is still not well-controlled - Monitor intake and output - Advanced heart failure team is following - He is on maximum medical therapy, cannot add GDMT in the setting of shock - Continue Eliquis for stroke prophylaxis - [facility] contacted 7/7 for potential transfer. Did not accept due to capacity and lack of additional therapies to offer.  - Not a candidate for any further cardioversion or medical therapies to control AF.   End-stage renal disease on hemodialysis Hypervolemic hyponatremia Hyperphosphatemia Hyperkalemia  High anion gap metabolic acidosis Hypercalcemia Patient is noncompliant with hemodialysis - Nephrology following - Continue CRRT - Closely monitor and supplement electrolytes - keeping even due to shock,  Acute septic encephalopathy - PAD as above - Weaning as tolerated  Morbid obesity Nutrition - Dietitian is following - TF stopped 7/7 for vomiting. KUB ok. Trickles started 7/8. Tolerated well. Large BM  7/9 Resume trickles today. If  fails we will image the belly.   Hypertriglyceridemia - Propofol was stopped - Monitor  Goals of care: - Extensive discussion with mother, RN, and additional family member. She seems to understand his condition and organ failures are likely not survivable. She is "at peace". She wishes to give him a little more time, but understands a palliative approach may be indicated. She wishes to give an additional 48 hours and will want to readdress goals of care 7/11.  Best Practice (right click and "Reselect all SmartList Selections" daily)   Diet/type: tube feeds DVT prophylaxis: DOAC GI prophylaxis: Protonix Lines: Yes still needed Foley: n/a Code Status:  full code Last date of multidisciplinary goals of care: 7/9: See "goals of care" above   Critical care time 42 minutes   Joneen Roach, AGACNP-BC [facility]  See Amion for personal pager PCCM on call pager [phone] until 7pm. Please call Elink 7p-7a. [phone]  02/01/2023 1:00 PM

## 2023-02-01 NOTE — Progress Notes (Signed)
Nutrition Follow-up  DOCUMENTATION CODES:   Morbid obesity  INTERVENTION:   Tube Feeding via Cortrak:  Vital 1.5 at 50 ml/hr Increase TF to rate of 30 ml/hr, titrate by 10 mL q 6 hours until goal rate of 50 ml/hr Increase Pro-Source TF20 60 mL to every 4 hours TF regimen at goal provides 2280 kcals, 201 g of protein and 1003 mL of free wat  Pt receiving additional calories in form of dextrose via calcium dextrose (ACD-A anticoagulant) for CRRT   Continue Renal MVI daily  NUTRITION DIAGNOSIS:   Inadequate oral intake related to inability to eat as evidenced by NPO status.  Being addressed via TF  GOAL:   Patient will meet greater than or equal to 90% of their needs  Progressing  MONITOR:   Vent status, TF tolerance, Labs, Weight trends  REASON FOR ASSESSMENT:   Ventilator, Consult Assessment of nutrition requirement/status  ASSESSMENT:   37 year old male with PMHx of ESRD on HD, nonischemic cardiomyopathy, refractory A-fib, possible amio lung toxicity admitted with acute on chronic hypoxmic respiratory failure in setting of virus and missed HD, probable sepsis.  6/29: pt transferred to ICU, CRRT initiated, Intubated 7/07: increasing pressor requirements, TF held, emesis reported 7/08: Restarted trickle TF, bowel regimen increased 7/09: constipation resolved with large type 6 BM  Pt remains on vent support, CRRT Levophed at 29, Vasopressin 0.04  Tolerating Vital 1.5 at 20 ml/hr via Cortrak  +large BM this AM, constipation resolved.  Abd xray this AM "markedly limited but grossly unremarkable"  Calcium gluconate for systemic infusion currently at 50 ml/hr; Citrate dextrose solution for CRRT at 300 ml/hr providing some calories in form of dextrose  Labs: phosphorus 6.5 (H) Meds: ss novolog   Diet Order:   Diet Order     None       EDUCATION NEEDS:   No education needs have been identified at this time  Skin:  Skin Assessment: Reviewed RN  Assessment  Last BM:  7/9 large type 6  Height:   Ht Readings from Last 1 Encounters:  01/21/23 6' (1.829 m)    Weight:   Wt Readings from Last 1 Encounters:  02/01/23 (!) 169.8 kg    Ideal Body Weight:  80.9 kg  BMI:  Body mass index is 50.77 kg/m.  Estimated Nutritional Needs:   Kcal:  2100-2300 kcals  Protein:  160-190 g  Fluid:  UOP + 1 L   Romelle Starcher MS, RDN, LDN, CNSC Registered Dietitian 3 Clinical Nutrition RD Pager and On-Call Pager Number Located in [place]

## 2023-02-01 NOTE — Consult Note (Signed)
   [facility] Inpatient Consult   02/01/2023  Alexy Stiteler Feb 27, 1986 [id number]  [facility] [[facility]]  Accountable Care Organization [ACO] Patient: [facility]  This is to acknowledge active patient with RN Care Coordinator prior to admission for patient currently in ICU level of care for patient sedated and on vent  Primary Care Provider:  Marrianne Mood, MD with [facility]   Patient has been active with [facility] [[facility]] Care Management for chronic disease management services.  Patient has been engaged by a Airline pilot on behalf of [facility].  The community based plan of care has focused on disease management and community resource support.    Patient currently sedated on the ventilator.  Plan:  Following for progress and disposition as appropriate.  Of note, [facility] Care Management services does not replace or interfere with any services that are needed or arranged by inpatient [facility] care management team.   For additional questions or referrals please contact:  Charlesetta Shanks, RN BSN CCM [facility]  [phone] business mobile phone Toll free office [phone]  *Concierge Line  [phone] Fax number: [phone] [email or web address]

## 2023-02-02 ENCOUNTER — Telehealth: Payer: Self-pay | Admitting: *Deleted

## 2023-02-02 DIAGNOSIS — J9601 Acute respiratory failure with hypoxia: Secondary | ICD-10-CM | POA: Diagnosis not present

## 2023-02-02 LAB — POCT I-STAT, CHEM 8
BUN: 36 mg/dL — ABNORMAL HIGH (ref 6–20)
BUN: 40 mg/dL — ABNORMAL HIGH (ref 6–20)
BUN: 38 mg/dL — ABNORMAL HIGH (ref 6–20)
BUN: 43 mg/dL — ABNORMAL HIGH (ref 6–20)
BUN: 40 mg/dL — ABNORMAL HIGH (ref 6–20)
BUN: 45 mg/dL — ABNORMAL HIGH (ref 6–20)
Calcium, Ion: 0.33 mmol/L — CL (ref 1.15–1.40)
Calcium, Ion: 1.04 mmol/L — ABNORMAL LOW (ref 1.15–1.40)
Calcium, Ion: 0.34 mmol/L — CL (ref 1.15–1.40)
Calcium, Ion: 1.08 mmol/L — ABNORMAL LOW (ref 1.15–1.40)
Calcium, Ion: 0.54 mmol/L — CL (ref 1.15–1.40)
Calcium, Ion: 1.1 mmol/L — ABNORMAL LOW (ref 1.15–1.40)
Chloride: 100 mmol/L (ref 98–111)
Chloride: 95 mmol/L — ABNORMAL LOW (ref 98–111)
Chloride: 97 mmol/L — ABNORMAL LOW (ref 98–111)
Chloride: 99 mmol/L (ref 98–111)
Chloride: 100 mmol/L (ref 98–111)
Chloride: 96 mmol/L — ABNORMAL LOW (ref 98–111)
Creatinine, Ser: 2.8 mg/dL — ABNORMAL HIGH (ref 0.61–1.24)
Creatinine, Ser: 4 mg/dL — ABNORMAL HIGH (ref 0.61–1.24)
Creatinine, Ser: 2.8 mg/dL — ABNORMAL HIGH (ref 0.61–1.24)
Creatinine, Ser: 4 mg/dL — ABNORMAL HIGH (ref 0.61–1.24)
Creatinine, Ser: 2.9 mg/dL — ABNORMAL HIGH (ref 0.61–1.24)
Creatinine, Ser: 4 mg/dL — ABNORMAL HIGH (ref 0.61–1.24)
Glucose, Bld: 178 mg/dL — ABNORMAL HIGH (ref 70–99)
Glucose, Bld: 215 mg/dL — ABNORMAL HIGH (ref 70–99)
Glucose, Bld: 176 mg/dL — ABNORMAL HIGH (ref 70–99)
Glucose, Bld: 209 mg/dL — ABNORMAL HIGH (ref 70–99)
Glucose, Bld: 162 mg/dL — ABNORMAL HIGH (ref 70–99)
Glucose, Bld: 193 mg/dL — ABNORMAL HIGH (ref 70–99)
HCT: 41 % (ref 39.0–52.0)
HCT: 47 % (ref 39.0–52.0)
HCT: 41 % (ref 39.0–52.0)
HCT: 46 % (ref 39.0–52.0)
HCT: 42 % (ref 39.0–52.0)
HCT: 46 % (ref 39.0–52.0)
Hemoglobin: 13.9 g/dL (ref 13.0–17.0)
Hemoglobin: 16 g/dL (ref 13.0–17.0)
Hemoglobin: 13.9 g/dL (ref 13.0–17.0)
Hemoglobin: 15.6 g/dL (ref 13.0–17.0)
Hemoglobin: 14.3 g/dL (ref 13.0–17.0)
Hemoglobin: 15.6 g/dL (ref 13.0–17.0)
Potassium: 3.9 mmol/L (ref 3.5–5.1)
Potassium: 3.9 mmol/L (ref 3.5–5.1)
Potassium: 3.7 mmol/L (ref 3.5–5.1)
Potassium: 3.9 mmol/L (ref 3.5–5.1)
Potassium: 4 mmol/L (ref 3.5–5.1)
Potassium: 4.1 mmol/L (ref 3.5–5.1)
Sodium: 136 mmol/L (ref 135–145)
Sodium: 139 mmol/L (ref 135–145)
Sodium: 136 mmol/L (ref 135–145)
Sodium: 139 mmol/L (ref 135–145)
Sodium: 136 mmol/L (ref 135–145)
Sodium: 138 mmol/L (ref 135–145)
TCO2: 25 mmol/L (ref 22–32)
TCO2: 26 mmol/L (ref 22–32)
TCO2: 27 mmol/L (ref 22–32)
TCO2: 28 mmol/L (ref 22–32)
TCO2: 25 mmol/L (ref 22–32)
TCO2: 28 mmol/L (ref 22–32)

## 2023-02-02 LAB — RENAL FUNCTION PANEL
Albumin: 3.1 g/dL — ABNORMAL LOW (ref 3.5–5.0)
Albumin: 3 g/dL — ABNORMAL LOW (ref 3.5–5.0)
Anion gap: 20 — ABNORMAL HIGH (ref 5–15)
Anion gap: 18 — ABNORMAL HIGH (ref 5–15)
BUN: 41 mg/dL — ABNORMAL HIGH (ref 6–20)
BUN: 45 mg/dL — ABNORMAL HIGH (ref 6–20)
CO2: 23 mmol/L (ref 22–32)
CO2: 23 mmol/L (ref 22–32)
Calcium: 10.6 mg/dL — ABNORMAL HIGH (ref 8.9–10.3)
Calcium: 11.1 mg/dL — ABNORMAL HIGH (ref 8.9–10.3)
Chloride: 92 mmol/L — ABNORMAL LOW (ref 98–111)
Chloride: 95 mmol/L — ABNORMAL LOW (ref 98–111)
Creatinine, Ser: 3.77 mg/dL — ABNORMAL HIGH (ref 0.61–1.24)
Creatinine, Ser: 3.84 mg/dL — ABNORMAL HIGH (ref 0.61–1.24)
GFR, Estimated: 20 mL/min — ABNORMAL LOW (ref >60)
GFR, Estimated: 20 mL/min — ABNORMAL LOW (ref >60)
Glucose, Bld: 165 mg/dL — ABNORMAL HIGH (ref 70–99)
Glucose, Bld: 156 mg/dL — ABNORMAL HIGH (ref 70–99)
Phosphorus: 5 mg/dL — ABNORMAL HIGH (ref 2.5–4.6)
Phosphorus: 5.4 mg/dL — ABNORMAL HIGH (ref 2.5–4.6)
Potassium: 4 mmol/L (ref 3.5–5.1)
Potassium: 4 mmol/L (ref 3.5–5.1)
Sodium: 135 mmol/L (ref 135–145)
Sodium: 136 mmol/L (ref 135–145)

## 2023-02-02 LAB — CBC
HCT: 39.5 % (ref 39.0–52.0)
Hemoglobin: 12.5 g/dL — ABNORMAL LOW (ref 13.0–17.0)
MCH: 31.3 pg (ref 26.0–34.0)
MCHC: 31.6 g/dL (ref 30.0–36.0)
MCV: 99 fL (ref 80.0–100.0)
Platelets: 183 10*3/uL (ref 150–400)
RBC: 3.99 MIL/uL — ABNORMAL LOW (ref 4.22–5.81)
RDW: 17.6 % — ABNORMAL HIGH (ref 11.5–15.5)
WBC: 24.7 10*3/uL — ABNORMAL HIGH (ref 4.0–10.5)
nRBC: 0.2 % (ref 0.0–0.2)

## 2023-02-02 LAB — POCT I-STAT 7, (LYTES, BLD GAS, ICA,H+H)
Acid-Base Excess: 2 mmol/L (ref 0.0–2.0)
Bicarbonate: 26.8 mmol/L (ref 20.0–28.0)
Calcium, Ion: 1.05 mmol/L — ABNORMAL LOW (ref 1.15–1.40)
HCT: 41 % (ref 39.0–52.0)
Hemoglobin: 13.9 g/dL (ref 13.0–17.0)
O2 Saturation: 96 %
Potassium: 4.1 mmol/L (ref 3.5–5.1)
Sodium: 135 mmol/L (ref 135–145)
TCO2: 28 mmol/L (ref 22–32)
pCO2 arterial: 40.9 mmHg (ref 32–48)
pH, Arterial: 7.424 (ref 7.35–7.45)
pO2, Arterial: 81 mmHg — ABNORMAL LOW (ref 83–108)

## 2023-02-02 LAB — GLUCOSE, CAPILLARY
Glucose-Capillary: 144 mg/dL — ABNORMAL HIGH (ref 70–99)
Glucose-Capillary: 147 mg/dL — ABNORMAL HIGH (ref 70–99)
Glucose-Capillary: 153 mg/dL — ABNORMAL HIGH (ref 70–99)
Glucose-Capillary: 154 mg/dL — ABNORMAL HIGH (ref 70–99)
Glucose-Capillary: 168 mg/dL — ABNORMAL HIGH (ref 70–99)
Glucose-Capillary: 170 mg/dL — ABNORMAL HIGH (ref 70–99)

## 2023-02-02 LAB — COOXEMETRY PANEL
Carboxyhemoglobin: 1.9 % — ABNORMAL HIGH (ref 0.5–1.5)
Methemoglobin: <0.7 % (ref 0.0–1.5)
O2 Saturation: 56.4 %
Total hemoglobin: 13 g/dL (ref 12.0–16.0)

## 2023-02-02 LAB — MAGNESIUM: Magnesium: 2.3 mg/dL (ref 1.7–2.4)

## 2023-02-02 LAB — TRIGLYCERIDES: Triglycerides: 340 mg/dL — ABNORMAL HIGH (ref <150)

## 2023-02-02 MED ORDER — POLYETHYLENE GLYCOL 3350 17 G PO PACK
17.0000 g | PACK | Freq: Every day | ORAL | Status: DC | PRN
  Filled 2023-02-02 (×2): qty 1

## 2023-02-02 MED ORDER — SENNOSIDES-DOCUSATE SODIUM 8.6-50 MG PO TABS
2.0000 | ORAL_TABLET | Freq: Every evening | ORAL | Status: DC | PRN
  Administered 2023-02-16: 2
  Filled 2023-02-02 (×3): qty 2

## 2023-02-02 MED ORDER — DOCUSATE SODIUM 50 MG/5ML PO LIQD
100.0000 mg | Freq: Two times a day (BID) | ORAL | Status: DC | PRN
  Administered 2023-02-15 – 2023-03-02 (×3): 100 mg
  Filled 2023-02-02 (×3): qty 10

## 2023-02-02 NOTE — Progress Notes (Signed)
Pharmacy Antibiotic Note  Phillip Young is a 37 y.o. male admitted on 01/21/2023 with bacteremia.  Pharmacy has been consulted for meropenem dosing.  Grew strep pneumoniae in Bcx on 6/28 and has been treated with ceftriaxone appropriately. Repeat Bcx neg.   On meropenem day # 4, remains on CRRT.  Cultures remain negative.  Plan: Meropenem 1g IV every 8 hours Monitor fever curve, cx results, clinical pic  Height: 6' (182.9 cm) Weight: (!) 174.7 kg (385 lb 2.3 oz) IBW/kg (Calculated) : 77.6  Temp (24hrs), Avg:99.6 F (37.6 C), Min:98.7 F (37.1 C), Max:100.6 F (38.1 C)  Recent Labs  Lab 01/29/23 0359 01/29/23 1507 01/30/23 0317 01/30/23 0325 01/31/23 0407 01/31/23 1013 02/01/23 1654 02/01/23 1918 02/01/23 1929 02/02/23 0211 02/02/23 0220 02/02/23 0400 02/02/23 1049  WBC 16.3*  --  20.0*  --  22.2*  --  23.5*  --   --   --   --  24.7*  --   CREATININE 4.01*   < > 4.37*   < > 4.31*   < > 3.78*   < > 4.00* 2.80* 4.00* 3.77* 4.00*   < > = values in this interval not displayed.     Estimated Creatinine Clearance: 41.6 mL/min (A) (by C-G formula based on SCr of 4 mg/dL (H)).    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, SRA positive   Metoprolol Other (See Comments)    near syncope   Amoxicillin Other (See Comments)    Was hospitalized    Other Swelling and Other (See Comments)    Steroids Fluid seeping out of legs     Antimicrobials this admission: 7/7 Meropenem >>  7/7 Linezolid >> 6/29 CTX >> 7/7 6/28 Vancomycin >> 7/1 6/28 Cefepime >> 6/29  Dose adjustments this admission: N/A  Microbiology results: 7/7 Bcx: ngtd  7/1 BCx x 2: neg  6/29 Ucx: ng  6/29 MRSA PCR: neg  6/28 BCx: S pneumo  Thank you for allowing pharmacy to participate in this  patient's care,  Jenetta Downer, [facility] Clinical Pharmacist  02/02/2023 2:58 PM   [facility] pharmacy phone numbers are listed on [email or web address]

## 2023-02-02 NOTE — Progress Notes (Signed)
NAME:  Phillip Young, MRN:  [id number], DOB:  [birth year], LOS: 12 ADMISSION DATE:  01/21/2023, CONSULTATION DATE:  01/22/23 REFERRING MD:  Danise Edge, CHIEF COMPLAINT:  SOB   History of Present Illness:  37 year old man well known to the PCCM and AHF services who presented to [facility] 6/28 with fevers, joint pains, SOB. PMHx significant for ESRD, NICM, refractory AFib, ?amio-induced lung toxicity. Patient reportedly missed HD on Thursday (has a history of leaving HD early). Reportedly ~10kg over dry weight with worsening breathing status, AFib/RVR and borderline BP.  PCCM consulted for ICU admission/further evaluation and management.  Pertinent Medical History:   Past Medical History:  Diagnosis Date   Acute on chronic respiratory failure with hypoxia ([name]) 04/21/2021   Acute on chronic systolic (congestive) heart failure ([name]) 02/26/2020   Amiodarone toxicity    Anemia    Atrial flutter ([name])    Biventricular congestive heart failure ([name])    Chronic hypoxemic respiratory failure ([name])    Class 3 severe obesity due to excess calories with serious comorbidity and body mass index (BMI) of 50.0 to 59.9 in adult [facility]) 02/26/2020   Essential hypertension 02/26/2020   GERD without esophagitis 02/26/2020   Hidradenitis suppurativa 02/26/2020   NICM (nonischemic cardiomyopathy) ([name])    Obesity hypoventilation syndrome ([name])    OSA (obstructive sleep apnea)    PAF (paroxysmal atrial fibrillation) ([name])    Pneumonia    Prediabetes 02/26/2020   Significant Hospital Events: Including procedures, antibiotic start and stop dates in addition to other pertinent events   6/28 Admit, emergent HD 6/29 Afib/RVR, shock, ICU transfer 6/30 Intubated and placed on vasopressor support and started on CRRT 7/1 Remains intubated on CRRT continue with pressor support 7/2 Agitated with SAT, remains on CRRT and low dose levophed 7/3 low dose NE, ongoing CRRT 7/4 Attempted SBT this am, apneic but severely agitated,  desaturated, HR up 130/140s.  Re-sedated but HR remains 140's, NE up from 6 to . NSR on EKG.  Was pulling 50-100on UF, since kept even for now.  CVP 8 7/5 DCCV again attempted this a.m. with success, remains on norepinephrine and vasopressin.  7/6 went into A-fib with RVR, attempted DCCV x 3, started on amiodarone infusion after amiodarone bolus 7/7 Multiple DCCV attempts without success. TF held due to vomiting.  7/8 KUB looked OK. No further vomiting. Tolerating TF. Large BM overnight.   Interim History / Subjective:  No improvements.  Remains on pressors, CRRT UF even, HR 140-200's.  Mother remains at bedside, wants sedation reduced.  Tmax 100.6  Objective:  Blood pressure 104/63, pulse 86, temperature 99.6 F (37.6 C), temperature source Oral, resp. rate (!) 26, height 6' (1.829 m), weight (!) 174.7 kg, SpO2 97 %. CVP:  [5 mmHg-14 mmHg] 11 mmHg  Vent Mode: PRVC FiO2 (%):  [40 %] 40 % Set Rate:  [24 bmp] 24 bmp Vt Set:  [600 mL] 600 mL PEEP:  [5 cmH20] 5 cmH20 Plateau Pressure:  [21 cmH20-26 cmH20] 24 cmH20   Intake/Output Summary (Last 24 hours) at 02/02/2023 0853 Last data filed at 02/02/2023 0800 Gross per 24 hour  Intake 2803.9 ml  Output 2141.9 ml  Net 662 ml   Filed Weights   01/29/23 0500 02/01/23 0453 02/02/23 0500  Weight: (!) 174.4 kg (!) 169.8 kg (!) 174.7 kg   Physical Examination: Fent 75, versed General:  critically ill/ obese adult male on MV in NAD HEENT: MM pink/moist/ ETT/ OGT, pupils 3/r Neuro: sedated, not f/c, move spont  CV: irrr, rates 140-180s PULM:  MV supported, breathing over set rate, coarse, mild tan secretions,  GI: soft, bs+, FMS Extremities: warm/dry, generalized edema  Skin: no rashes   Tmax 100.6 Labs reviewed  Resolved Hospital Problem List:  Hematuria due to traumatic Foley insertion  Assessment & Plan:  Acute-on-chronic hypoxemic respiratory failure Bilateral multifocal pneumonia Pulmonary edema due to noncompliance with  hemodialysis/AonC HF OHS/OSA, noncompliant with CPAP -  cont full MV support, 4-8cc/kg IBW with goal Pplat <30 and DP<15  - VAP prevention protocol/ PPI - PAD protocol for sedation> minimize fentanyl /versed as hemodynamically tolerated  - cont pulmonary hygiene - previously not tolerated WUA/ SBT's due to high HR and hemodynamic instability  - day 10 ETT, not a candidate for tracheostomy given multiple organ failure   Combined cardiogenic and septic shock due to strep pneumo bacteremia, POA - BCx grew strep pneumo, likely secondary to bilateral lower lobe pneumonia. - Broadened to linezolid and meropenem with worsening pressor demand 7/7 > narrowed to meropenem alone with negative MRSA PCR.  Cont meropenem  - Repeat BC pending NGTD - continued high dose NE/ vasopressin support, MAP goal > 65 - ideally, would need line change but remains hemodynamically unstable for this  Acute on chronic HFrEF Nonischemic cardiomyopathy Echocardiogram 5/24 with unchanged EF of 20 to 25% Paroxysmal rapid A-fib/flutter with RVR - History of multiple DCCVs with history of amiodarone toxicity treated with steroids - Had to be started on 7/7 after bolus, DC yesterday at family request.  - [facility] contacted 7/7 for potential transfer. Did not accept due to capacity and lack of additional therapies to offer.  - Not a candidate for any further cardioversion or medical therapies to control AF.  - despite maximum medical therapy, cannot add GDMT in the setting of shock.  HR remains uncontrolled.  HF team signing off 7/10, recs for ongoing palliative care measures.  Not a candidate for any other advanced therapies and patient has failed all other therapies in the setting of MODS, BMI, and numerous co-morbid conditions.  Ongoing recommendations for palliation.  - cont Eliquis for stroke prophylaxis  End-stage renal disease on hemodialysis Hypervolemic hyponatremia Hyperphosphatemia Hyperkalemia  High anion gap  metabolic acidosis Hypercalcemia Patient is noncompliant with hemodialysis - Nephrology following - Continue CRRT for now, UF remains even, does not tolerate any fluid removal.  Will stop CRRT pending next filter change or clotted filter.  Will not restart given end stage MODS as above.   Acute septic encephalopathy - PAD as above, minimizing today as able for mother to communicate   Morbid obesity Nutrition - Dietitian is following - TF stopped 7/7 for vomiting. KUB ok. Trickles started 7/8. Tolerated well. Large BM  7/9 - trickle feeds resumed 7/9, tolerating thus far, continue  Hypertriglyceridemia - Propofol d/c  Goals of care: -7/9 Extensive discussion with mother, RN, and additional family member. She seems to understand his condition and organ failures are likely not survivable. She is "at peace". She wishes to give him a little more time, but understands a palliative approach may be indicated. She wishes to give an additional 48 hours and will want to readdress goals of care 7/11.  - Mother updated again extensively 7/10.  No improvements.  Mother wants sedation down so that she may interact with him but also need to ensure patient is not suffering. She has comfort in her faith and praying for a miracle.  After discussions with Dr. Tonia Brooms, myself and bedside RN, mother  agrees that any further CPR/ defib would not change his outcome and likely provide further suffering and pain.  Changed to DNR, no further CPR or cardioversion.  Will stop CRRT at next filter change or clot> mother in agreement with this as well.    Best Practice (right click and "Reselect all SmartList Selections" daily)   Diet/type: tube feeds DVT prophylaxis: DOAC GI prophylaxis: Protonix Lines: Yes still needed Foley: n/a Code Status:  full code > DNR 7/10 Last date of multidisciplinary goals of care: 7/9: See "goals of care" above   Critical care time  55 minutes    Posey Boyer, MSN, NP,  AG-ACNP-BC [facility] 02/02/2023, 8:53 AM  See Amion for pager If no response to pager , please call [phone] until 7pm After 7:00 pm call Elink  [phone]

## 2023-02-02 NOTE — Progress Notes (Addendum)
[facility] Progress Note  Subjective: seen in ICU bed, sedated, on the vent. I/O yest were even.   Vitals:   02/02/23 1000 02/02/23 1015 02/02/23 1030 02/02/23 1045  BP:      Pulse: (!) 128 (!) 123 (!) 122 (!) 118  Resp: (!) 32 (!) 32 (!) 32 (!) 32  Temp:      TempSrc:      SpO2: 97% 97% 97% 97%  Weight:      Height:        Exam: General: Ill looking male, intubated, sedated Heart: A-fib with RVR, s1s2 nl Lungs: Coarse breath sound bilateral Abdomen:soft, Non-tender, non-distended Extremities: no sig LE or UE edema Dialysis Access: RIJ TDC in place.  Site clean.    OP HD: TTS [facility]  4h  425/ 1.5  3K/2.5 bath   180kg  TDC   - mircera 200 mcg q 2 wks, last 6/13 - sensipar 60mg  three times per week - hectorol 4 mcg three times per week - doesn't stay on for full sessions, doesn't adhere to fluid restriction  CXR 7/06- vasc congestion, no edema    Assessment/ Plan: ESRD: Required emergent HD on 6/29 because of fluid overload.  Then developed shock requiring high doses of pressors for hypotension/shock and afib w/ RVR.  Started CRRT on 6/29. Pt is not tolerating CRRT well with frequent clotting and prognosis is poor given this and the other severe medical issues. Not sure if he will tolerate much more CRRT.   Volume:  6/29- 7/3 on CRRT was net neg 19.3 L over 5 days. CXR 7/06 was clear. Wts are down 5-10 kg below dry wt. CVP's 10-14. Cont to keep even w/ CRRT.  Acute on chronic systolic CHF, nonischemic: EF 16-10. Per cardiology team pt is not a candidate for ICD and advanced therapies for CHF because of obesity and dialysis.  Sepsis/septic shock: w/ strep pneumo bacteremia initially w/ presumed PNA. On IV meropenem now. Levo gtt down to 26 mcg/ min, + vaso at 0.04.  Anemia of CKD: Hemoglobin at goal.  Received Aranesp on 7/1. Hypercalcemia: using low bicarb/ zero Ca++ dialysate at 1500 cc/hr. Ca++ peaked at `12 and was down to 10.6 yest afternoon. Cont to  follow A-fib with RVR with hypotension: S/P cardioversion 7/5 -> NSR -> afib RVR 7/6 early AM refractory to [facility] x3 + amiodarone.      Vinson Moselle MD  [facility] 02/02/2023, 11:17 AM  Recent Labs  Lab 02/01/23 1654 02/01/23 1918 02/02/23 0400 02/02/23 0409 02/02/23 1049  HGB 12.6*   < > 12.5* 13.9 13.9  ALBUMIN 3.3*  --  3.1*  --   --   CALCIUM 10.8*  --  10.6*  --   --   PHOS 5.4*  --  5.0*  --   --   CREATININE 3.78*   < > 3.77*  --  4.00*  K 3.9   < > 4.0 4.1 3.9   < > = values in this interval not displayed.    No results for input(s): "IRON", "TIBC", "FERRITIN" in the last 168 hours. Inpatient medications:  apixaban  5 mg Per Tube BID   Chlorhexidine Gluconate Cloth  6 each Topical Q0600   darbepoetin (ARANESP) injection - DIALYSIS  100 mcg Subcutaneous Q Mon-1800   feeding supplement (PROSource TF20)  60 mL Per Tube Q4H   fentaNYL (SUBLIMAZE) injection  50 mcg Intravenous Once   hydrOXYzine  10 mg Per Tube TID  insulin aspart  0-9 Units Subcutaneous Q4H   lidocaine  1 patch Transdermal Q24H   melatonin  5 mg Per Tube QHS   multivitamin  1 tablet Per Tube QHS   mouth rinse  15 mL Mouth Rinse Q2H   pantoprazole (PROTONIX) IV  40 mg Intravenous Q24H     prismasol BGK 4/2.5 400 mL/hr at 02/01/23 1935   sodium chloride Stopped (01/22/23 1859)   calcium gluconate 20 g in dextrose 5 % 1,000 mL infusion 50 mL/hr at 02/02/23 1000   citrate dextrose 250 mL/hr at 02/02/23 0900   feeding supplement (VITAL 1.5 CAL) 50 mL/hr at 02/02/23 1000   fentaNYL infusion INTRAVENOUS 50 mcg/hr (02/02/23 1000)   meropenem (MERREM) IV 200 mL/hr at 02/02/23 1000   midazolam 1 mg/hr (02/02/23 1000)   norepinephrine (LEVOPHED) Adult infusion 26 mcg/min (02/02/23 1000)   prismasol B22GK 4/0 1,500 mL/hr at 02/02/23 0418   prismasol BGK 4/2.5 1,500 mL/hr at 01/31/23 1143   vasopressin 0.04 Units/min (02/02/23 1000)   sodium chloride, acetaminophen **OR** acetaminophen, bisacodyl, docusate,  fentaNYL, midazolam, ondansetron (ZOFRAN) IV, mouth rinse, polyethylene glycol, polyvinyl alcohol, senna-docusate, surgical lubricant

## 2023-02-02 NOTE — Progress Notes (Signed)
Sherrie Mustache NP and I were present during the conversation held by Dr. Tonia Brooms and the patient's mother. They discussed that further CPR/defibrillation wouldn't benefit the patient and might cause more suffering, mother stated she understood. Code status was changed to DNR, meaning CPR and defibrillation will no longer be attempted. Additionally, it was agreed to stop CRRT at the next filter change or if it clots. The mother concurred with this plan.

## 2023-02-02 NOTE — Telephone Encounter (Signed)
Spoke to Phillip Young after she called our clinic a few times this afternoon on behalf of her son. She wishes the best for her son but understands the severity of his illness. I deferred questions about management and hospital-hospital transfer to his ICU team.

## 2023-02-02 NOTE — Progress Notes (Addendum)
PCCM:  Additional bedside meeting with patients mother. She was initially very upset.  She is very concerned about his sedation steal as well as the decision made earlier regarding DNR status.  She did not seem to have a good understanding of his intolerance of the continuous dialysis and the fact that we were unable to pull negative volume.  I explained this problem in detail and the fact that he is maxed on multiple vasopressors.  She understands now that the dialysis need and his an inability to tolerate this is the disease process that is going to likely take his life.  She would like to at least have some confirmation that the transfer to [facility] was attempted.  We are going to reach out for clear documentation so that she understands that we did at least attempt to do this.  The conversation was witnessed by bedside nursing, charge nurse, director of 2 heart, nurse practitioner with critical care.  All of [place] were in the room with the discussion and she agreed to remain a DNR, no escalation, no chest compressions no shocks and if he continues to not tolerate dialysis she understands that the CVVHD machine will need to be discontinued.  This patient is critically ill with multiple organ system failure; which, requires frequent high complexity decision making, assessment, support, evaluation, and titration of therapies. This was completed through the application of advanced monitoring technologies and extensive interpretation of multiple databases. During this encounter critical care time was devoted to patient care services described in this note for 35 minutes.   Josephine Igo, DO [facility] 02/02/2023 4:57 PM

## 2023-02-02 NOTE — Progress Notes (Signed)
2H Nursing Director, 2H Charge Nurse, CCM Nurse Practitioner, Bedside Nurse (self), all present during the conversation held by CCM Dr. Tonia Brooms and patient's mother. Patient's overall outcomes, persistent decline, and code status all discussed in detail. Mother verbalized understanding of the situation and expressed gratitude for the care that the entire medical team continues to provide for her son. For specific details please refer to CCM note.

## 2023-02-02 NOTE — TOC Progression Note (Addendum)
Transition of Care [facility]) - Progression Note    Patient Details  Name: Phillip Young MRN: [id number] Date of Birth: 1986-01-05  Transition of [facility]) CM/SW Contact  Elliot Cousin, RN Phone Number: [phone] 02/02/2023, 4:59 PM  Clinical Narrative:  HF TOC CM at bedside and spoke to mother. She wants to see if pt can transfer to [facility]. She is hopeful that patient will pull through his illness. Therapeutic conversation given during this difficult time. Mother states pt has been through so much and was hopeful he had more years left to live. She was tearful and states she has spoke to her church family, friend and family and they have been praying nonstop for his recovery. Provided mother with HF TOC CM contact number for support. Provided mother with meal voucher.      Expected Discharge Plan: Home/Self Care Barriers to Discharge: Continued Medical Work up  Expected Discharge Plan and Services   Discharge Planning Services: CM Consult   Living arrangements for the past 2 months: Single Family Home                                       Social Determinants of Health (SDOH) Interventions SDOH Screenings   Food Insecurity: No Food Insecurity (01/22/2023)  Housing: Low Risk  (01/22/2023)  Transportation Needs: No Transportation Needs (01/22/2023)  Utilities: Not At Risk (01/22/2023)  Alcohol Screen: Low Risk  (09/21/2022)  Depression (PHQ2-9): High Risk (09/21/2022)  Financial Resource Strain: High Risk (09/21/2022)  Physical Activity: Inactive (09/21/2022)  Social Connections: Unknown (09/21/2022)  Stress: No Stress Concern Present (09/21/2022)  Tobacco Use: Medium Risk (01/22/2023)    Readmission Risk Interventions    06/09/2022   12:19 PM 05/12/2021   10:35 AM 10/16/2020    2:10 PM  Readmission Risk Prevention Plan  Transportation Screening Complete Complete Complete  PCP or Specialist Appt within 5-7 Days   Complete  Home Care Screening   Complete  Medication  Review (RN CM)   Referral to Pharmacy  Medication Review (RN Care Manager) Complete Complete   PCP or Specialist appointment within 3-5 days of discharge Complete Complete   HRI or Home Care Consult Complete Complete   SW Recovery Care/Counseling Consult Complete Complete   Palliative Care Screening Not Applicable Complete   Skilled Nursing Facility Not Applicable Complete

## 2023-02-02 NOTE — Telephone Encounter (Signed)
Call from patient's mother.  Patent is currently in ICU/ .  Mother is upset that patient is a DNR against her wishes.  Would like to have patient transferred to [facility] .  Feels tat she is not being told the truth about why he has not been transferred.  Would like to talk with his doctors in the Clinic. About her son.  Stated that her son liked seeing the Clinic doctors.  Message was given to Dr. Sol Blazing who plans to talk with the mother about her son's treatment.

## 2023-02-02 NOTE — Progress Notes (Signed)
Patient ID: Phillip Young, male   DOB: [birth year], 37 y.o.   MRN: [id number]     Advanced Heart Failure Rounding Note  PCP-Cardiologist: Marca Ancona, MD   Subjective:   7/4: Back in A fib RVR and underwent urgent cardioversion x3 but failed to convert. Pressor requirements increased. Vaso added and Norepi increased.  7/5: Successful DCCV to NSR.  7/7: Atrial fibrillation with RVR recurred overnight, failed DCCV x 3, started on amiodarone gtt. Failed multiple cardioversions. Amio stopped again. No benefit.   Tm 100.6   On meropneum.   7/7 Bld CX - negative.    CVVH running even, CVP 10-11  Remains on NE 26 mcg + vaso 0.04 units+ midodrine 15 mg tid. CO-OX 56%.   Remains intubated/sedated in A fib RVR.   Mom at bedside.    Objective:   Weight Range: (!) 174.7 kg Body mass index is 52.23 kg/m.   Vital Signs:   Temp:  [98.3 F (36.8 C)-100.6 F (38.1 C)] 99.6 F (37.6 C) (07/10 0741) Pulse Rate:  [76-143] 94 (07/10 0645) Resp:  [20-32] 27 (07/10 0645) BP: (101-115)/(63-85) 104/63 (07/09 1540) SpO2:  [73 %-100 %] 96 % (07/10 0645) Arterial Line BP: (73-214)/(43-74) 118/60 (07/10 0645) FiO2 (%):  [40 %] 40 % (07/10 0400) Weight:  [174.7 kg] 174.7 kg (07/10 0500) Last BM Date : 02/01/23  Weight change: Filed Weights   01/29/23 0500 02/01/23 0453 02/02/23 0500  Weight: (!) 174.4 kg (!) 169.8 kg (!) 174.7 kg    Intake/Output:   Intake/Output Summary (Last 24 hours) at 02/02/2023 0806 Last data filed at 02/02/2023 0600 Gross per 24 hour  Intake 2339.69 ml  Output 1953.9 ml  Net 385.79 ml     CVP 10-11  Physical Exam  General:  Intubated/sedated on CRRT HEENT: ETT  Neck: supple. JVP 10-11. Carotids 2+ bilat; no bruits. No lymphadenopathy or thryomegaly appreciated. Cor: PMI nondisplaced. Tachy Irregular rate & rhythm. No rubs, gallops or murmurs. Lungs: clear Abdomen: obese, soft, nontender, nondistended. No hepatosplenomegaly. No bruits or masses. Good bowel  sounds. Extremities: no cyanosis, clubbing, rash, edema Neuro: Intubated/sedated  .  Telemetry    A Fib  140-180s   EKG    N/A   Labs    CBC Recent Labs    01/31/23 0407 01/31/23 1013 02/01/23 1654 02/01/23 1918 02/02/23 0400 02/02/23 0409  WBC 22.2*  --  23.5*  --  24.7*  --   NEUTROABS 17.4*  --   --   --   --   --   HGB 13.5   < > 12.6*   < > 12.5* 13.9  HCT 41.7   < > 39.5   < > 39.5 41.0  MCV 101.7*  --  100.3*  --  99.0  --   PLT 233  --  183  --  183  --    < > = values in this interval not displayed.   Basic Metabolic Panel Recent Labs    29/51/88 0430 02/01/23 0945 02/01/23 1654 02/01/23 1918 02/02/23 0220 02/02/23 0400 02/02/23 0409  NA 135   < > 135   < > 136 135 135  K 3.7   < > 3.9   < > 3.9 4.0 4.1  CL 87*   < > 93*   < > 100 92*  --   CO2 25  --  23  --   --  23  --   GLUCOSE 143*   < > 150*   < >  178* 165*  --   BUN 40*   < > 40*   < > 40* 41*  --   CREATININE 3.99*   < > 3.78*   < > 4.00* 3.77*  --   CALCIUM 11.0*  --  10.8*  --   --  10.6*  --   MG 1.9  --   --   --   --  2.3  --   PHOS 6.5*  --  5.4*  --   --  5.0*  --    < > = values in this interval not displayed.   Liver Function Tests Recent Labs    02/01/23 1654 02/02/23 0400  ALBUMIN 3.3* 3.1*   No results for input(s): "LIPASE", "AMYLASE" in the last 72 hours. Cardiac Enzymes No results for input(s): "CKTOTAL", "CKMB", "CKMBINDEX", "TROPONINI" in the last 72 hours.  BNP: BNP (last 3 results) Recent Labs    05/22/22 0732 08/05/22 1720 01/21/23 1739  BNP 373.1* 15.5 1,528.6*    ProBNP (last 3 results) No results for input(s): "PROBNP" in the last 8760 hours.   D-Dimer No results for input(s): "DDIMER" in the last 72 hours. Hemoglobin A1C No results for input(s): "HGBA1C" in the last 72 hours. Fasting Lipid Panel Recent Labs    02/02/23 0400  TRIG 340*   Thyroid Function Tests No results for input(s): "TSH", "T4TOTAL", "T3FREE", "THYROIDAB" in the last 72  hours.  Invalid input(s): "FREET3"  Other results:   Imaging    No results found.   Medications:     Scheduled Medications:  apixaban  5 mg Per Tube BID   Chlorhexidine Gluconate Cloth  6 each Topical Q0600   darbepoetin (ARANESP) injection - DIALYSIS  100 mcg Subcutaneous Q Mon-1800   feeding supplement (PROSource TF20)  60 mL Per Tube Q4H   fentaNYL (SUBLIMAZE) injection  50 mcg Intravenous Once   hydrOXYzine  10 mg Per Tube TID   insulin aspart  0-9 Units Subcutaneous Q4H   lidocaine  1 patch Transdermal Q24H   melatonin  5 mg Per Tube QHS   multivitamin  1 tablet Per Tube QHS   mouth rinse  15 mL Mouth Rinse Q2H   pantoprazole (PROTONIX) IV  40 mg Intravenous Q24H    Infusions:   prismasol BGK 4/2.5 400 mL/hr at 02/01/23 1935   sodium chloride Stopped (01/22/23 1859)   calcium gluconate 20 g in dextrose 5 % 1,000 mL infusion 50 mL/hr at 02/02/23 0600   citrate dextrose 250 mL/hr at 02/02/23 0600   feeding supplement (VITAL 1.5 CAL) 50 mL/hr at 02/02/23 0600   fentaNYL infusion INTRAVENOUS 125 mcg/hr (02/02/23 0600)   meropenem (MERREM) IV Stopped (02/02/23 0127)   midazolam 6 mg/hr (02/02/23 0600)   norepinephrine (LEVOPHED) Adult infusion 26 mcg/min (02/02/23 0600)   prismasol B22GK 4/0 1,500 mL/hr at 02/02/23 0418   prismasol BGK 4/2.5 1,500 mL/hr at 01/31/23 1143   vasopressin 0.04 Units/min (02/02/23 0600)    PRN Medications: sodium chloride, acetaminophen **OR** acetaminophen, bisacodyl, docusate, fentaNYL, midazolam, ondansetron (ZOFRAN) IV, mouth rinse, polyethylene glycol, polyvinyl alcohol, senna-docusate, surgical lubricant    Patient Profile   37 y.o. 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 Started on CRRT. BCx + for strep pneumo   Assessment/Plan   1.  Acute on chronic systolic CHF--Cardiogenic Shock: Nonischemic CMP, LHC 2021 with no CAD. Previous hospitalizations w/shock in setting of rapid AFib/Flutter requiring milrinone. Most recent echo 5/24 was unchanged with EF 20-25%, RV difficult to visualize.  He signs off early from HD and was significantly volume overloaded this admission. Suspect combination cardiogenic and septic shock at this time.  Currently on vasopressin 0.04 units,+ Norepi 26 and midodrine 15 tid.   CRRT running even unable to pull with ongoing hypotension.  Remains in A fib RVR.  - Not candidate for advanced therapies or mechanical support.  2. Atrial flutter/fibrillation: History of multiple DCCVs. Amiodarone previously stopped due to elevated LFTs. Restarted 05/20/22 by Dr. Elberta Fortis.  Concern for amiodarone pulmonary toxicity w/ elevated ESR and CRP 11/23. He has been off amiodarone and was treated with steroids.  AF ablation at [facility] in 12/23. He was in NSR at admission but went into atrial flutter with RVR on 7/3. Had urgent cardioversion x 3 on 7/4 but failed to convert. Successful cardioversion on 7/5.  Back in AF with RVR early am 7/6, amiodarone was restarted (understanding he has had pulmonary and liver toxicity in past) as last ditch effort to try to get him back in NSR.  Now has failed DCCV x 3 early am and DCCV x 3 by Dr Shirlee Latch.  Remains in A fib RVR 140-180s.    - No longer on amiodarone. (lung and hepatic toxicity) or Tikosyn (ESRD), no long-term anti-arrhythmic option.  -  Continue Eliquis 5 mg bid  - If we are unable to slow his rhythm, doubt he can survive.  3. History of amiodarone lung toxicity: Seen by pulmonary during admit 11/23. Had elevated ESR and sed rate. Treated with prednisone.   4. ESRD: HD per nephrology, on T,Th,Sat schedule.  Poor compliance  with chronic treatment (signs off early). Significantly volume overloaded at admission. Weight down with CRRT. CVP 10-11 today.  - CVVH running even with worsening pressor requirement.  5. ID/Bacteremia: Fever at admission.  Suspect PNA. BCx + for Strep pneumoniae.   WBC 24.7.  - Panculture- No growth.  - Currently on + meropenem.  6. OHS/OSA: Has been on CPAP and home oxygen.  7. Lower extremity wounds: - Per primary  - Wound consulted 8. Acute on chronic hypoxemic respiratory failure: Baseline OHS/OSA, now suspect PNA and CHF/volume overload. Remains Intubated.  - Antibiotics as above.  - CRRT 9. GOC: Palliative care has seen and spoken with family.  They have wanted full code.   Multiple conversations about grim prognosis. His Mom tells me it is all in the Lords hands. She wants to continue current care.   HF Team will sign off. Not candidate with MCS as noted previously and he has been shocked multiple times and has remains in A fib RVR.    7/7  [facility] called for transfer. He was not accepted for transfer because he has no options.   Jerron Niblack NP-C  02/02/2023 8:06 AM

## 2023-02-02 NOTE — Progress Notes (Signed)
eLink Physician-Brief Progress Note Patient Name: Phillip Young DOB: Sep 04, 1985 MRN: [id number]   Date of Service  02/02/2023  HPI/Events of Note  Patient is having ongoing watery stools on multiple stool softeners  eICU Interventions  Flexi-Seal  Changed stool softeners to as needed     Intervention Category Minor Interventions: Routine modifications to care plan (e.g. PRN medications for pain, fever)  Jaline Pincock 02/02/2023, 6:45 AM

## 2023-02-03 DIAGNOSIS — J9601 Acute respiratory failure with hypoxia: Secondary | ICD-10-CM | POA: Diagnosis not present

## 2023-02-03 DIAGNOSIS — Z66 Do not resuscitate: Secondary | ICD-10-CM

## 2023-02-03 DIAGNOSIS — A419 Sepsis, unspecified organism: Secondary | ICD-10-CM | POA: Diagnosis not present

## 2023-02-03 DIAGNOSIS — E8770 Fluid overload, unspecified: Secondary | ICD-10-CM | POA: Diagnosis not present

## 2023-02-03 DIAGNOSIS — Z515 Encounter for palliative care: Secondary | ICD-10-CM | POA: Diagnosis not present

## 2023-02-03 LAB — RENAL FUNCTION PANEL
Albumin: 3 g/dL — ABNORMAL LOW (ref 3.5–5.0)
Albumin: 2.9 g/dL — ABNORMAL LOW (ref 3.5–5.0)
Anion gap: 20 — ABNORMAL HIGH (ref 5–15)
Anion gap: 20 — ABNORMAL HIGH (ref 5–15)
BUN: 46 mg/dL — ABNORMAL HIGH (ref 6–20)
BUN: 54 mg/dL — ABNORMAL HIGH (ref 6–20)
CO2: 21 mmol/L — ABNORMAL LOW (ref 22–32)
CO2: 21 mmol/L — ABNORMAL LOW (ref 22–32)
Calcium: 10.8 mg/dL — ABNORMAL HIGH (ref 8.9–10.3)
Calcium: 10.5 mg/dL — ABNORMAL HIGH (ref 8.9–10.3)
Chloride: 94 mmol/L — ABNORMAL LOW (ref 98–111)
Chloride: 96 mmol/L — ABNORMAL LOW (ref 98–111)
Creatinine, Ser: 3.78 mg/dL — ABNORMAL HIGH (ref 0.61–1.24)
Creatinine, Ser: 4.09 mg/dL — ABNORMAL HIGH (ref 0.61–1.24)
GFR, Estimated: 20 mL/min — ABNORMAL LOW (ref >60)
GFR, Estimated: 18 mL/min — ABNORMAL LOW (ref >60)
Glucose, Bld: 166 mg/dL — ABNORMAL HIGH (ref 70–99)
Glucose, Bld: 174 mg/dL — ABNORMAL HIGH (ref 70–99)
Phosphorus: 5 mg/dL — ABNORMAL HIGH (ref 2.5–4.6)
Phosphorus: 5.4 mg/dL — ABNORMAL HIGH (ref 2.5–4.6)
Potassium: 4.3 mmol/L (ref 3.5–5.1)
Potassium: 4.8 mmol/L (ref 3.5–5.1)
Sodium: 135 mmol/L (ref 135–145)
Sodium: 137 mmol/L (ref 135–145)

## 2023-02-03 LAB — POCT I-STAT, CHEM 8
BUN: 40 mg/dL — ABNORMAL HIGH (ref 6–20)
BUN: 45 mg/dL — ABNORMAL HIGH (ref 6–20)
BUN: 44 mg/dL — ABNORMAL HIGH (ref 6–20)
BUN: 47 mg/dL — ABNORMAL HIGH (ref 6–20)
Calcium, Ion: 0.33 mmol/L — CL (ref 1.15–1.40)
Calcium, Ion: 1.06 mmol/L — ABNORMAL LOW (ref 1.15–1.40)
Calcium, Ion: 0.32 mmol/L — CL (ref 1.15–1.40)
Calcium, Ion: 1.12 mmol/L — ABNORMAL LOW (ref 1.15–1.40)
Chloride: 102 mmol/L (ref 98–111)
Chloride: 98 mmol/L (ref 98–111)
Chloride: 102 mmol/L (ref 98–111)
Chloride: 97 mmol/L — ABNORMAL LOW (ref 98–111)
Creatinine, Ser: 2.8 mg/dL — ABNORMAL HIGH (ref 0.61–1.24)
Creatinine, Ser: 4.1 mg/dL — ABNORMAL HIGH (ref 0.61–1.24)
Creatinine, Ser: 3 mg/dL — ABNORMAL HIGH (ref 0.61–1.24)
Creatinine, Ser: 4.2 mg/dL — ABNORMAL HIGH (ref 0.61–1.24)
Glucose, Bld: 164 mg/dL — ABNORMAL HIGH (ref 70–99)
Glucose, Bld: 198 mg/dL — ABNORMAL HIGH (ref 70–99)
Glucose, Bld: 175 mg/dL — ABNORMAL HIGH (ref 70–99)
Glucose, Bld: 215 mg/dL — ABNORMAL HIGH (ref 70–99)
HCT: 41 % (ref 39.0–52.0)
HCT: 46 % (ref 39.0–52.0)
HCT: 43 % (ref 39.0–52.0)
HCT: 47 % (ref 39.0–52.0)
Hemoglobin: 13.9 g/dL (ref 13.0–17.0)
Hemoglobin: 15.6 g/dL (ref 13.0–17.0)
Hemoglobin: 14.6 g/dL (ref 13.0–17.0)
Hemoglobin: 16 g/dL (ref 13.0–17.0)
Potassium: 4.1 mmol/L (ref 3.5–5.1)
Potassium: 4.2 mmol/L (ref 3.5–5.1)
Potassium: 4.2 mmol/L (ref 3.5–5.1)
Potassium: 4.3 mmol/L (ref 3.5–5.1)
Sodium: 136 mmol/L (ref 135–145)
Sodium: 139 mmol/L (ref 135–145)
Sodium: 136 mmol/L (ref 135–145)
Sodium: 139 mmol/L (ref 135–145)
TCO2: 24 mmol/L (ref 22–32)
TCO2: 26 mmol/L (ref 22–32)
TCO2: 23 mmol/L (ref 22–32)
TCO2: 26 mmol/L (ref 22–32)

## 2023-02-03 LAB — COOXEMETRY PANEL
Carboxyhemoglobin: 1.5 % (ref 0.5–1.5)
Methemoglobin: <0.7 % (ref 0.0–1.5)
O2 Saturation: 55.4 %
Total hemoglobin: 13.2 g/dL (ref 12.0–16.0)

## 2023-02-03 LAB — CBC
HCT: 40.5 % (ref 39.0–52.0)
Hemoglobin: 12.8 g/dL — ABNORMAL LOW (ref 13.0–17.0)
MCH: 32 pg (ref 26.0–34.0)
MCHC: 31.6 g/dL (ref 30.0–36.0)
MCV: 101.3 fL — ABNORMAL HIGH (ref 80.0–100.0)
Platelets: 175 10*3/uL (ref 150–400)
RBC: 4 MIL/uL — ABNORMAL LOW (ref 4.22–5.81)
RDW: 17.6 % — ABNORMAL HIGH (ref 11.5–15.5)
WBC: 28.3 10*3/uL — ABNORMAL HIGH (ref 4.0–10.5)
nRBC: 0.2 % (ref 0.0–0.2)

## 2023-02-03 LAB — GLUCOSE, CAPILLARY
Glucose-Capillary: 166 mg/dL — ABNORMAL HIGH (ref 70–99)
Glucose-Capillary: 157 mg/dL — ABNORMAL HIGH (ref 70–99)
Glucose-Capillary: 184 mg/dL — ABNORMAL HIGH (ref 70–99)
Glucose-Capillary: 167 mg/dL — ABNORMAL HIGH (ref 70–99)
Glucose-Capillary: 184 mg/dL — ABNORMAL HIGH (ref 70–99)
Glucose-Capillary: 177 mg/dL — ABNORMAL HIGH (ref 70–99)

## 2023-02-03 LAB — CALCIUM, IONIZED
Calcium, Ionized, Serum: 4.3 mg/dL — ABNORMAL LOW (ref 4.5–5.6)
Calcium, Ionized, Serum: 4.6 mg/dL (ref 4.5–5.6)

## 2023-02-03 LAB — TRIGLYCERIDES: Triglycerides: 304 mg/dL — ABNORMAL HIGH (ref <150)

## 2023-02-03 LAB — MAGNESIUM: Magnesium: 2.3 mg/dL (ref 1.7–2.4)

## 2023-02-03 MED ORDER — ANTICOAGULANT SODIUM CITRATE 4% (200MG/5ML) IV SOLN
5.0000 mL | Freq: Once | Status: AC
  Administered 2023-02-03: 5 mL
  Filled 2023-02-03: qty 5

## 2023-02-03 MED ORDER — PRISMASOL BGK 4/2.5 32-4-2.5 MEQ/L REPLACEMENT SOLN: Status: DC

## 2023-02-03 MED ORDER — PRISMASOL BGK 4/2.5 32-4-2.5 MEQ/L EC SOLN: Status: DC

## 2023-02-03 NOTE — Progress Notes (Signed)
Spoke with [employer]. Instructed to contact nephrologist regarding orders for locking off the HD catheter now that CRRT is being discontinued. VU Tomasita Morrow, RN VAST

## 2023-02-03 NOTE — Progress Notes (Addendum)
eLink Physician-Brief Progress Note Patient Name: Phillip Young DOB: [birth year] MRN: [id number]   Date of Service  02/03/2023  HPI/Events of Note  37 year old gentleman recurrent missions, end-stage renal disease, end-stage nonischemic cardiomyopathy. He has been in the ICU now for several days. Unfortunately he is still requiring ongoing vasopressor use.  Per team decision earlier, continuous renal replacement therapy has been discontinued since the filter has gone down.  The patient remains hemodynamically unstable on 2 pressors and in tachyarrhythmia.  Currently not stable for transfer.  I discussed the case with the patient's mother at bedside and expressed my concern for transport as well as multiple previous attempts at transfer to [facility].  She requested that I call their transfer center to address the case again.    eICU Interventions  I reviewed the case with Selena Batten at [facility] transfer center.  They do a daily review at 6:30 AM but had no availability at this time.  I again notified patient's mother at bedside that [name] is critically ill and I fear that he is not stable enough for transport regardless of acceptance and is continuing to decline.  We will maintain DNR status.  Maintain off CRRT.  Will continue to monitor but likely no benefit to escalation of care at this time.   4098 -spoke again with [name], let her know that [facility] has refused transfer and  his situation is more tenuous.  Prognosis is extremely poor.  She is grateful for the attempted transfer, recognizes the grim prognosis, and is grateful for the team's care.  Intervention Category Intermediate Interventions: Communication with other healthcare providers and/or family  Conrad [place] 02/03/2023, 11:27 PM

## 2023-02-03 NOTE — Progress Notes (Signed)
NAME:  Phillip Young, MRN:  [id number], DOB:  02-19-1986, LOS: 13 ADMISSION DATE:  01/21/2023, CONSULTATION DATE:  01/22/23 REFERRING MD:  Danise Edge, CHIEF COMPLAINT:  SOB   History of Present Illness:  37 year old man well known to the PCCM and AHF services who presented to [facility] 6/28 with fevers, joint pains, SOB. PMHx significant for ESRD, NICM, refractory AFib, ?amio-induced lung toxicity. Patient reportedly missed HD on Thursday (has a history of leaving HD early). Reportedly ~10kg over dry weight with worsening breathing status, AFib/RVR and borderline BP.  PCCM consulted for ICU admission/further evaluation and management.  Pertinent Medical History:   Past Medical History:  Diagnosis Date   Acute on chronic respiratory failure with hypoxia ([name]) 04/21/2021   Acute on chronic systolic (congestive) heart failure ([name]) 02/26/2020   Amiodarone toxicity    Anemia    Atrial flutter ([name])    Biventricular congestive heart failure ([name])    Chronic hypoxemic respiratory failure ([name])    Class 3 severe obesity due to excess calories with serious comorbidity and body mass index (BMI) of 50.0 to 59.9 in adult [facility]) 02/26/2020   Essential hypertension 02/26/2020   GERD without esophagitis 02/26/2020   Hidradenitis suppurativa 02/26/2020   NICM (nonischemic cardiomyopathy) ([name])    Obesity hypoventilation syndrome ([name])    OSA (obstructive sleep apnea)    PAF (paroxysmal atrial fibrillation) ([name])    Pneumonia    Prediabetes 02/26/2020   Significant Hospital Events: Including procedures, antibiotic start and stop dates in addition to other pertinent events   6/28 Admit, emergent HD 6/29 Afib/RVR, shock, ICU transfer 6/30 Intubated and placed on vasopressor support and started on CRRT 7/1 Remains intubated on CRRT continue with pressor support 7/2 Agitated with SAT, remains on CRRT and low dose levophed 7/3 low dose NE, ongoing CRRT 7/4 Attempted SBT this am, apneic but severely agitated,  desaturated, HR up 130/140s.  Re-sedated but HR remains 140's, NE up from 6 to . NSR on EKG.  Was pulling 50-100on UF, since kept even for now.  CVP 8 7/5 DCCV again attempted this a.m. with success, remains on norepinephrine and vasopressin.  7/6 went into A-fib with RVR, attempted DCCV x 3, started on amiodarone infusion after amiodarone bolus 7/7 Multiple DCCV attempts without success. TF held due to vomiting. Tx denied to [facility]  7/8 KUB looked OK. No further vomiting. Tolerating TF. Large BM overnight.  7/9 vasopressors back up, remains rapid afib/ flutter, Bmx 1, wua causing hemodynamic instability 7/10 weaned sedation on mothers request, HR remains 140-220s, tmax 100.6, DNR, no escalation  Interim History / Subjective:  No changes, remains on high dose pressors, HR sustained 180-200s, tmax 101.6  Objective:  Blood pressure 113/89, pulse (!) 135, temperature (!) 101.6 F (38.7 C), temperature source Axillary, resp. rate (!) 33, height 6' (1.829 m), weight (!) 171.7 kg, SpO2 98%. CVP:  [8 mmHg-18 mmHg] 15 mmHg  Vent Mode: PRVC FiO2 (%):  [40 %] 40 % Set Rate:  [24 bmp] 24 bmp Vt Set:  [600 mL] 600 mL PEEP:  [5 cmH20] 5 cmH20 Plateau Pressure:  [22 cmH20-24 cmH20] 24 cmH20   Intake/Output Summary (Last 24 hours) at 02/03/2023 1116 Last data filed at 02/03/2023 1000 Gross per 24 hour  Intake 2649.48 ml  Output 2140 ml  Net 509.48 ml   Filed Weights   02/01/23 0453 02/02/23 0500 02/03/23 0500  Weight: (!) 169.8 kg (!) 174.7 kg (!) 171.7 kg   Physical Examination: Fent 75, versed 1  General:  AoC ill appearing obese male in NAD HEENT: MM pink/moist, mildly diaphoretic, ETT w/audible air leak> was at 23, advanced to 25 with +ETCO2, and resolution of airleak, pupils 3/r Neuro: does not open eyes or respond to noxious stimuli, no spont movement CV: irrr, rates 180-200's PULM:  MV supported, diffuse rhonchi> minimal tan secretions> coarse, no wheeze  GI: obese, soft,  +bs Extremities: warm/dry, generalized edema  Skin: no rashes  Labs reviewed   Resolved Hospital Problem List:  Hematuria due to traumatic Foley insertion  Assessment & Plan:  Acute-on-chronic hypoxemic respiratory failure Bilateral multifocal pneumonia Pulmonary edema due to noncompliance with hemodialysis/AonC HF OHS/OSA, noncompliant with CPAP -  cont full MV support, 4-8cc/kg IBW with goal Pplat <30 and DP<15  - VAP prevention protocol/ PPI - PAD protocol for sedation> fentanyl /versed - previously not tolerated WUA/ SBT's due to high HR and hemodynamic instability > defer further WUA - abx as below  Combined cardiogenic and septic shock due to strep pneumo bacteremia, POA - BCx grew strep pneumo, likely secondary to bilateral lower lobe pneumonia. - Broadened to linezolid and meropenem with worsening pressor demand 7/7 > narrowed to meropenem alone with negative MRSA PCR.  Cont meropenem for now.  - Repeat BC pending NGTD - continued high dose NE/ vasopressin support, MAP goal > 65 - ideally, needs line holiday/ change but remains hemodynamically unstable for this  Acute on chronic HFrEF Nonischemic cardiomyopathy Echocardiogram 5/24 with unchanged EF of 20 to 25% Paroxysmal rapid A-fib/flutter with RVR - History of multiple DCCVs with history of amiodarone toxicity treated with steroids - Amiodarone started on 7/7 after bolus, D/C'd 7/7 at family request.  - [facility] contacted 7/7 for potential transfer. Did not accept due to capacity and lack of additional therapies to offer.  - cont Eliquis for stroke prophylaxis - not a candidate for GDMT in the setting of shock - despite maximum medical therapy, vasopressor support, HR remains uncontrolled, unable to tolerate volume removal w/CRRT given hemodynamic instability, and ongoing fevers despite broad abx.  HF team signed off 7/10, recs for ongoing palliative care measures.  Not a candidate for any other advanced therapies and  patient has failed all other therapies in the setting of MODS, BMI, and numerous co-morbid conditions.  Ongoing recommendations for palliation relayed to family.  Appreciate PMT assistance.  DNR with no escalation in care as of 7/10.  Ongoing family support.   End-stage renal disease on hemodialysis Hypervolemic hyponatremia Hyperphosphatemia Hyperkalemia  High anion gap metabolic acidosis Hypercalcemia Patient is noncompliant with hemodialysis - Nephrology following - Continue CRRT for now, will not restart if filter clots.    Acute septic encephalopathy - PAD as above, have minimized per mother's request, patient remains unresponsive to noxious stimuli  Morbid obesity Nutrition - RD following - TF stopped 7/7 for vomiting. KUB ok. Trickles started 7/8. Tolerated well. Large BM  7/9 - trickle feeds resumed 7/9, tolerating thus far, continue  Hypertriglyceridemia - Propofol d/c  Goals of care: -7/9 Extensive discussion with mother, RN, and additional family member. She seems to understand his condition and organ failures are likely not survivable. She is "at peace". She wishes to give him a little more time, but understands a palliative approach may be indicated. She wishes to give an additional 48 hours and will want to readdress goals of care 7/11.  - Mother updated again extensively 7/10.  No improvements.  Mother wants sedation down so that she may interact with him but  also need to ensure patient is not suffering. She has comfort in her faith and praying for a miracle.  After discussions with Dr. Tonia Brooms, myself and bedside RN, mother agrees that any further CPR/ defib would not change his outcome and likely provide further suffering and pain.  Changed to DNR, no further CPR or cardioversion.  Will not restart CRRT if filter clots.  No escalation in care.   Best Practice (right click and "Reselect all SmartList Selections" daily)   Diet/type: tube feeds DVT prophylaxis: DOAC GI  prophylaxis: Protonix Lines: Yes still needed Foley: n/a Code Status:  DNR 7/10 Last date of multidisciplinary goals of care: 7/9: See "goals of care" above   Critical care time  36 minutes    Posey Boyer, MSN, NP, AG-ACNP-BC [facility] 02/03/2023, 11:16 AM  See Amion for pager If no response to pager , please call [phone] until 7pm After 7:00 pm call Elink  [phone]

## 2023-02-03 NOTE — Progress Notes (Signed)
CRRT filter is overdue to be changed, spoke with MD Icard and MD Schertz and they agree to let filter run until it clogs and then turn CRRT off.

## 2023-02-03 NOTE — Progress Notes (Addendum)
[facility] Progress Note  Subjective: seen in ICU bed, sedated, on the vent. I/O yest were even.   Vitals:   02/03/23 0615 02/03/23 0700 02/03/23 0734 02/03/23 0800  BP:  106/85    Pulse: (!) 127 (!) 127    Resp: (!) 43 (!) 37 (!) 33   Temp:    (!) 101.6 F (38.7 C)  TempSrc:    Axillary  SpO2: 95% 97%    Weight:      Height:        Exam: General: Ill looking male, intubated, sedated Heart: A-fib with RVR, s1s2 nl Lungs: Coarse breath sound bilateral Abdomen:soft, Non-tender, non-distended Extremities: no sig LE or UE edema Dialysis Access: RIJ TDC in place.  Site clean.    OP HD: TTS [facility]  4h  425/ 1.5  3K/2.5 bath   180kg  TDC   - mircera 200 mcg q 2 wks, last 6/13 - sensipar 60mg  three times per week - hectorol 4 mcg three times per week - doesn't stay on for full sessions, doesn't adhere to fluid restriction  CXR 7/06- vasc congestion, no edema    Assessment/ Plan: ESRD: Required emergent HD on 6/29 because of fluid overload.  Then developed shock requiring high doses of pressors for hypotension/shock and afib w/ RVR.  Started CRRT on 6/29. Pt required citrate protocol for frequent clotting started 7/06. Will stop citrate protocol, resume usual CRRT.  Volume:  6/29- 7/3 on CRRT was net neg 19.3 L over 5 days. CXR 7/06 was clear. Wts are down 5-10 kg below dry wt. CVP's 10-14. Cont to keep even w/ CRRT.  Acute on chronic systolic CHF, nonischemic: EF 40-98. Per cardiology team pt is not a candidate for ICD and advanced therapies for CHF because of obesity and dialysis.  Sepsis/septic shock: w/ strep pneumo bacteremia initially w/ presumed PNA. On IV meropenem now. Still requiring Levo and vaso at high doses.  Anemia of CKD: Hemoglobin at goal.  Received Aranesp on 7/1. Hypercalcemia: due to citrate protocol. Ca++ peaked at `12 and is down to 10.6 now. Citrate protocol to be dc'd today.   A-fib with RVR with hypotension: S/P cardioversion 7/5 -> NSR  -> afib RVR 7/6 early AM refractory to [facility] x3 + amiodarone.      Vinson Moselle MD  [facility] 02/03/2023, 8:41 AM  Recent Labs  Lab 02/02/23 1830 02/02/23 1831 02/03/23 0217 02/03/23 0411 02/03/23 0412  HGB 14.3   < > 15.6  --  12.8*  ALBUMIN 3.0*  --   --  3.0*  --   CALCIUM 11.1*  --   --  10.8*  --   PHOS 5.4*  --   --  5.0*  --   CREATININE 3.84*  4.00*   < > 2.80* 3.78*  --   K 4.0  4.1   < > 4.1 4.3  --    < > = values in this interval not displayed.   No results for input(s): "IRON", "TIBC", "FERRITIN" in the last 168 hours. Inpatient medications:  apixaban  5 mg Per Tube BID   Chlorhexidine Gluconate Cloth  6 each Topical Q0600   darbepoetin (ARANESP) injection - DIALYSIS  100 mcg Subcutaneous Q Mon-1800   feeding supplement (PROSource TF20)  60 mL Per Tube Q4H   fentaNYL (SUBLIMAZE) injection  50 mcg Intravenous Once   hydrOXYzine  10 mg Per Tube TID   insulin aspart  0-9 Units Subcutaneous Q4H   lidocaine  1 patch Transdermal Q24H   melatonin  5 mg Per Tube QHS   multivitamin  1 tablet Per Tube QHS   mouth rinse  15 mL Mouth Rinse Q2H   pantoprazole (PROTONIX) IV  40 mg Intravenous Q24H     prismasol BGK 4/2.5 400 mL/hr at 02/02/23 2149   sodium chloride Stopped (01/22/23 1859)   calcium gluconate 20 g in dextrose 5 % 1,000 mL infusion 50 mL/hr at 02/03/23 0800   citrate dextrose 250 mL/hr at 02/03/23 0800   feeding supplement (VITAL 1.5 CAL) 50 mL/hr at 02/03/23 0800   fentaNYL infusion INTRAVENOUS 75 mcg/hr (02/03/23 0800)   meropenem (MERREM) IV 1 g (02/03/23 0827)   midazolam 1 mg/hr (02/03/23 0800)   norepinephrine (LEVOPHED) Adult infusion 18 mcg/min (02/03/23 0800)   prismasol B22GK 4/0 1,500 mL/hr at 02/03/23 0435   prismasol BGK 4/2.5 1,500 mL/hr at 01/31/23 1143   vasopressin 0.04 Units/min (02/03/23 0826)   sodium chloride, acetaminophen **OR** acetaminophen, bisacodyl, docusate, fentaNYL, midazolam, ondansetron (ZOFRAN) IV, mouth rinse, polyethylene  glycol, polyvinyl alcohol, senna-docusate, surgical lubricant

## 2023-02-03 NOTE — Progress Notes (Signed)
Daily Progress Note   Patient Name: Phillip Young       Date: 02/03/2023 DOB: 06/23/1986  Age: 37 y.o. MRN#: [id number] Attending Physician: Josephine Igo, DO Primary Care Physician: Marrianne Mood, MD Admit Date: 01/21/2023  Reason for Consultation/Follow-up: Establishing goals of care  Subjective: I have reviewed medical records including EPIC notes, MAR, and labs. Per notes, mother may be open for transition to full comfort measures today. HT team signed off on 7/10. Plan is to stop CVVHD at next filter change or clotting of machine. Received report from primary RN - no acute concerns.  Discussed case with Dr. Tonia Brooms.  Went to visit patient at bedside - mother/Phillip Young present. Patient was lying in bed, he remains intubated and sedated. He appears very ill.   Emotional support provided to Phillip Young. Therapeutic listening provided as she reflects on discussions with medical team yesterday. She feels she has been "lied to" in regards to attempts at contacting [facility] for transfer - she confirms that she was present for discussions between Dr. Tonia Brooms and [facility] yesterday. She requests that medical team reach out to [facility] daily for transfer request and she would like to be present. Phillip Young has also reached out to [facility] herself - they have confirmed with her that no beds are available for patient's transfer. Phillip Young continues to understand her son remains critically ill with high risk of dying during a transfer.   Phillip Young tells me "nothing is stronger than a mother's prayer." Validated her love for him is strong. She tells me "he has prayer from all around the [place] right now." She shares, "I'm not being unrealistic, I know he's dying, God has the last say." She also notes understanding that, "he's getting  worse, not getting better." Validated that I continue to be concerned about his condition with multiorgan system failure. Offered chaplain support - chaplain has seen her and patient previously but she is open to another visit.  Phillip Young does express frustration regarding conversations yesterday. She states, "don't come in here every day telling me he's dying. I am trying to keep hope alive. He'll be a testimony to you." Phillip Young expresses concern that patient's tube feeds are causing elevated blood glucose levels. Education provided around careful monitoring of nutrition, labs, and electrolytes in context of his body being under a lot of stress. She  expressed understanding but continued to be concerned - offered to have RD see her to provide additional insight - she is appreciative and agreeable.  Dr. Phillips Odor and PMT RN/Dawn also met Phillip Young at bedside - ongoing support offered. Phillip Young expresses appreciation for PMT and medical team's support.  All questions and concerns addressed. Encouraged to call with questions and/or concerns. PMT card previously provided.  Notified RD of mothers concerns - RD will discuss with her.  Length of Stay: 13  Current Medications: Scheduled Meds:   apixaban  5 mg Per Tube BID   Chlorhexidine Gluconate Cloth  6 each Topical Q0600   darbepoetin (ARANESP) injection - DIALYSIS  100 mcg Subcutaneous Q Mon-1800   feeding supplement (PROSource TF20)  60 mL Per Tube Q4H   fentaNYL (SUBLIMAZE) injection  50 mcg Intravenous Once   hydrOXYzine  10 mg Per Tube TID   insulin aspart  0-9 Units Subcutaneous Q4H   lidocaine  1 patch Transdermal Q24H   melatonin  5 mg Per Tube QHS   multivitamin  1 tablet Per Tube QHS   mouth rinse  15 mL Mouth Rinse Q2H   pantoprazole (PROTONIX) IV  40 mg Intravenous Q24H    Continuous Infusions:   prismasol BGK 4/2.5 400 mL/hr at 02/02/23 2149   sodium chloride Stopped (01/22/23 1859)   calcium gluconate 20 g in dextrose 5 % 1,000 mL  infusion 50 mL/hr at 02/03/23 0900   citrate dextrose 250 mL/hr at 02/03/23 0900   feeding supplement (VITAL 1.5 CAL) 50 mL/hr at 02/03/23 0900   fentaNYL infusion INTRAVENOUS 75 mcg/hr (02/03/23 0900)   meropenem (MERREM) IV Stopped (02/03/23 0857)   midazolam 1 mg/hr (02/03/23 0900)   norepinephrine (LEVOPHED) Adult infusion 18 mcg/min (02/03/23 0900)   prismasol B22GK 4/0 1,500 mL/hr at 02/03/23 0850   prismasol BGK 4/2.5 1,500 mL/hr at 01/31/23 1143   vasopressin 0.04 Units/min (02/03/23 0900)    PRN Meds: sodium chloride, acetaminophen **OR** acetaminophen, bisacodyl, docusate, fentaNYL, midazolam, ondansetron (ZOFRAN) IV, mouth rinse, polyethylene glycol, polyvinyl alcohol, senna-docusate, surgical lubricant  Physical Exam Vitals and nursing note reviewed.  Constitutional:      General: He is not in acute distress.    Appearance: He is ill-appearing.     Interventions: He is sedated and intubated.  Pulmonary:     Effort: No respiratory distress. He is intubated.  Skin:    General: Skin is warm and dry.  Neurological:     Mental Status: He is unresponsive.             Vital Signs: BP 102/73   Pulse (!) 116   Temp (!) 101.6 F (38.7 C) (Axillary)   Resp (!) 34   Ht 6' (1.829 m)   Wt (!) 171.7 kg   SpO2 97%   BMI 51.34 kg/m  SpO2: SpO2: 97 % O2 Device: O2 Device: Ventilator O2 Flow Rate: O2 Flow Rate (L/min): 13 L/min  Intake/output summary:  Intake/Output Summary (Last 24 hours) at 02/03/2023 0917 Last data filed at 02/03/2023 0900 Gross per 24 hour  Intake 2849.45 ml  Output 2217.6 ml  Net 631.85 ml   LBM: Last BM Date : 02/03/23 Baseline Weight: Weight: (!) 182.1 kg Most recent weight: Weight: (!) 171.7 kg       Palliative Assessment/Data: PPS 30% on tube feeds      Patient Active Problem List   Diagnosis Date Noted   Pressure injury of skin 01/23/2023   Acute hypoxic respiratory failure ([name]) 01/21/2023  Atrial fibrillation with rapid  ventricular response ([name]) 01/21/2023   Acute on chronic hypoxic respiratory failure ([name]) 08/07/2022   Decompensated heart failure ([name]) 08/05/2022   Elevated liver enzymes 06/08/2022   Multiple nodules of lung 05/31/2022   Septic shock ([name])    Advanced care planning/counseling discussion    Recurrent infections 09/30/2021   Reactive depression 09/02/2021   Hypervolemia 08/18/2021   Type 2 diabetes mellitus with diabetic chronic kidney disease ([name]) 06/26/2021   Medical non-compliance 04/21/2021   Hypotension 04/21/2021   ESRD (end stage renal disease) on dialysis ([name]) 04/16/2021   ESRD (end stage renal disease) ([name]) 04/12/2021   Paroxysmal atrial flutter ([name]) 04/12/2021   Cardiogenic shock ([name])    Chronic respiratory failure with hypoxia ([name]) 02/11/2021   Chronic systolic CHF (congestive heart failure) ([name])    Chronic cough    Morbid obesity ([name]) 02/26/2020   GERD without esophagitis 02/26/2020   OSA (obstructive sleep apnea) 02/26/2020   Hidradenitis suppurativa 02/26/2020    Palliative Care Assessment & Plan   Patient Profile: 37 y.o. male with past medical history of AF, HFrEF (EF 20-25%), severe obesity, ESRD on HD, chronic hypoxic respiratory failure, OSA presented to ED on 01/22/23 from home with severe dyspnea - poor adherence to outpatient HD and fluid restriction. Patient was admitted on 01/21/2023 with acute on chronic hypoxic respiratory failure, hypervolemic, sepsis/septic shock, afib with RVR, chest pain, hematuria. CRRT was started 01/22/23. Cardiology was consulted: he is not a candidate for advanced therapies or ICD due to frailty, obesity, and dialysis. Cardioversion was attempted x3 on 01/27/23 without success. Options are very limited. If NSR cannot be restored, family will need to consider comfort measures. PMT was consulted to assist family with GOC.   Assessment: Principal Problem:   Acute hypoxic respiratory failure ([name]) Active Problems:   Morbid  obesity ([name])   Chronic systolic CHF (congestive heart failure) ([name])   Cardiogenic shock ([name])   ESRD (end stage renal disease) ([name])   ESRD (end stage renal disease) on dialysis ([name])   Hypervolemia   Septic shock ([name])   Atrial fibrillation with rapid ventricular response ([name])   Pressure injury of skin   Concern about end of life  Recommendations/Plan: Continue maximal medical therapies Agree with DNR Mother wants daily requests to [facility] for transfer, she wishes to be present for phone call Notified RD of mother's concerns regarding tube feeds Mother continues to express understanding patient is dying, but she does not want life prolonging measures to be discontinued. Patient will likely pass on mechanical support PMT will continue to follow and support holistically  Goals of Care and Additional Recommendations: Limitations on Scope of Treatment: Full Scope Treatment  Code Status:    Code Status Orders  (From admission, onward)           Start     Ordered   02/02/23 1128  Do not attempt resuscitation (DNR)  Continuous       Question Answer Comment  If patient has no pulse and is not breathing Do Not Attempt Resuscitation   If patient has a pulse and/or is breathing: Medical Treatment Goals LIMITED ADDITIONAL INTERVENTIONS: Use medication/IV fluids and cardiac monitoring as indicated; Do not use intubation or mechanical ventilation (DNI), also provide comfort medications.  Transfer to Progressive/Stepdown as indicated, avoid Intensive Care.   Consent: Discussion documented in EHR or advanced directives reviewed      02/02/23 1128           Code  Status History     Date Active Date Inactive Code Status Order ID Comments User Context   01/21/2023 2058 02/02/2023 1128 Full Code [id number]  Marrianne Mood, MD Inpatient   08/05/2022 2158 08/13/2022 1951 Full Code [id number]  Doran Stabler, DO ED   05/22/2022 1545 06/10/2022 2021 Full Code [id number]  [name] Mann, DO ED    03/26/2022 0830 03/28/2022 2024 Full Code [id number]  Marolyn Haller, MD ED   09/02/2021 1646 09/04/2021 1906 Full Code [id number]  Marolyn Haller, MD ED   08/18/2021 2033 08/23/2021 0004 Full Code [id number]  Merrilyn Puma, MD ED   04/12/2021 2154 05/20/2021 0156 Full Code [id number]  Hillary Bow, DO ED   02/11/2021 1849 03/26/2021 1740 Full Code [id number]  Orland Mustard, MD ED   10/08/2020 1424 10/16/2020 2059 Full Code [id number]  Claudean Severance, MD ED   02/26/2020 2340 03/11/2020 2330 Full Code [id number]  Shalhoub, Deno Lunger, MD ED       Prognosis:  Grim  Discharge Planning: Anticipated [facility] Death  Care plan was discussed with primary RN, Dr. Tonia Brooms, patient's mother, Dr. Phillips Odor, PMT RN/Dawn  Thank you for allowing the Palliative Medicine Team to assist in the care of this patient.   Total Time 65 minutes Prolonged Time Billed  yes       Greater than 50%  of this time was spent counseling and coordinating care related to the above assessment and plan.  Haskel Khan, NP  Please contact Palliative Medicine Team phone at [phone] for questions and concerns.   *Portions of this note are a verbal dictation therefore any spelling and/or grammatical errors are due to the "Dragon [employer] One" system interpretation.

## 2023-02-03 NOTE — Progress Notes (Signed)
   02/03/23 1500  Spiritual Encounters  Type of Visit Follow up  Care provided to: Pt and family  Referral source Family  Reason for visit End-of-life  OnCall Visit No   Ch responded to request for emotional and spiritual support. [name] mother had just steeped out of the room. Bedside nurse said she will be back tonight. Ch will follow-up with pt's tomorrow.

## 2023-02-04 DIAGNOSIS — J9601 Acute respiratory failure with hypoxia: Secondary | ICD-10-CM | POA: Diagnosis not present

## 2023-02-04 DIAGNOSIS — R570 Cardiogenic shock: Secondary | ICD-10-CM

## 2023-02-04 DIAGNOSIS — N186 End stage renal disease: Secondary | ICD-10-CM | POA: Diagnosis not present

## 2023-02-04 DIAGNOSIS — I4891 Unspecified atrial fibrillation: Secondary | ICD-10-CM | POA: Diagnosis not present

## 2023-02-04 LAB — POCT I-STAT, CHEM 8
BUN: 114 mg/dL — ABNORMAL HIGH (ref 6–20)
BUN: 122 mg/dL — ABNORMAL HIGH (ref 6–20)
BUN: 114 mg/dL — ABNORMAL HIGH (ref 6–20)
BUN: 116 mg/dL — ABNORMAL HIGH (ref 6–20)
Calcium, Ion: 0.45 mmol/L — CL (ref 1.15–1.40)
Calcium, Ion: 0.98 mmol/L — ABNORMAL LOW (ref 1.15–1.40)
Calcium, Ion: 0.36 mmol/L — CL (ref 1.15–1.40)
Calcium, Ion: 0.91 mmol/L — ABNORMAL LOW (ref 1.15–1.40)
Chloride: 104 mmol/L (ref 98–111)
Chloride: 99 mmol/L (ref 98–111)
Chloride: 104 mmol/L (ref 98–111)
Chloride: 98 mmol/L (ref 98–111)
Creatinine, Ser: 5.4 mg/dL — ABNORMAL HIGH (ref 0.61–1.24)
Creatinine, Ser: 7.2 mg/dL — ABNORMAL HIGH (ref 0.61–1.24)
Creatinine, Ser: 5.1 mg/dL — ABNORMAL HIGH (ref 0.61–1.24)
Creatinine, Ser: 6.1 mg/dL — ABNORMAL HIGH (ref 0.61–1.24)
Glucose, Bld: 207 mg/dL — ABNORMAL HIGH (ref 70–99)
Glucose, Bld: 216 mg/dL — ABNORMAL HIGH (ref 70–99)
Glucose, Bld: 253 mg/dL — ABNORMAL HIGH (ref 70–99)
Glucose, Bld: 273 mg/dL — ABNORMAL HIGH (ref 70–99)
HCT: 39 % (ref 39.0–52.0)
HCT: 43 % (ref 39.0–52.0)
HCT: 36 % — ABNORMAL LOW (ref 39.0–52.0)
HCT: 42 % (ref 39.0–52.0)
Hemoglobin: 13.3 g/dL (ref 13.0–17.0)
Hemoglobin: 14.6 g/dL (ref 13.0–17.0)
Hemoglobin: 12.2 g/dL — ABNORMAL LOW (ref 13.0–17.0)
Hemoglobin: 14.3 g/dL (ref 13.0–17.0)
Potassium: 5.7 mmol/L — ABNORMAL HIGH (ref 3.5–5.1)
Potassium: 6.2 mmol/L — ABNORMAL HIGH (ref 3.5–5.1)
Potassium: 5 mmol/L (ref 3.5–5.1)
Potassium: 5.1 mmol/L (ref 3.5–5.1)
Sodium: 132 mmol/L — ABNORMAL LOW (ref 135–145)
Sodium: 135 mmol/L (ref 135–145)
Sodium: 134 mmol/L — ABNORMAL LOW (ref 135–145)
Sodium: 135 mmol/L (ref 135–145)
TCO2: 18 mmol/L — ABNORMAL LOW (ref 22–32)
TCO2: 21 mmol/L — ABNORMAL LOW (ref 22–32)
TCO2: 19 mmol/L — ABNORMAL LOW (ref 22–32)
TCO2: 23 mmol/L (ref 22–32)

## 2023-02-04 LAB — RENAL FUNCTION PANEL
Albumin: 2.8 g/dL — ABNORMAL LOW (ref 3.5–5.0)
Albumin: 2.7 g/dL — ABNORMAL LOW (ref 3.5–5.0)
Anion gap: 17 — ABNORMAL HIGH (ref 5–15)
Anion gap: 20 — ABNORMAL HIGH (ref 5–15)
BUN: 76 mg/dL — ABNORMAL HIGH (ref 6–20)
BUN: 111 mg/dL — ABNORMAL HIGH (ref 6–20)
CO2: 20 mmol/L — ABNORMAL LOW (ref 22–32)
CO2: 17 mmol/L — ABNORMAL LOW (ref 22–32)
Calcium: 10.3 mg/dL (ref 8.9–10.3)
Calcium: 9.7 mg/dL (ref 8.9–10.3)
Chloride: 96 mmol/L — ABNORMAL LOW (ref 98–111)
Chloride: 96 mmol/L — ABNORMAL LOW (ref 98–111)
Creatinine, Ser: 5.03 mg/dL — ABNORMAL HIGH (ref 0.61–1.24)
Creatinine, Ser: 6.77 mg/dL — ABNORMAL HIGH (ref 0.61–1.24)
GFR, Estimated: 14 mL/min — ABNORMAL LOW (ref >60)
GFR, Estimated: 10 mL/min — ABNORMAL LOW (ref >60)
Glucose, Bld: 158 mg/dL — ABNORMAL HIGH (ref 70–99)
Glucose, Bld: 151 mg/dL — ABNORMAL HIGH (ref 70–99)
Phosphorus: 6.1 mg/dL — ABNORMAL HIGH (ref 2.5–4.6)
Phosphorus: 6.9 mg/dL — ABNORMAL HIGH (ref 2.5–4.6)
Potassium: 5.1 mmol/L (ref 3.5–5.1)
Potassium: 6.4 mmol/L (ref 3.5–5.1)
Sodium: 133 mmol/L — ABNORMAL LOW (ref 135–145)
Sodium: 133 mmol/L — ABNORMAL LOW (ref 135–145)

## 2023-02-04 LAB — CBC
HCT: 39.2 % (ref 39.0–52.0)
Hemoglobin: 12.7 g/dL — ABNORMAL LOW (ref 13.0–17.0)
MCH: 33.1 pg (ref 26.0–34.0)
MCHC: 32.4 g/dL (ref 30.0–36.0)
MCV: 102.1 fL — ABNORMAL HIGH (ref 80.0–100.0)
Platelets: 159 10*3/uL (ref 150–400)
RBC: 3.84 MIL/uL — ABNORMAL LOW (ref 4.22–5.81)
RDW: 18 % — ABNORMAL HIGH (ref 11.5–15.5)
WBC: 32.1 10*3/uL — ABNORMAL HIGH (ref 4.0–10.5)
nRBC: 0.1 % (ref 0.0–0.2)

## 2023-02-04 LAB — MAGNESIUM: Magnesium: 2.6 mg/dL — ABNORMAL HIGH (ref 1.7–2.4)

## 2023-02-04 LAB — CULTURE, BLOOD (ROUTINE X 2)
Culture: NO GROWTH
Culture: NO GROWTH
Special Requests: ADEQUATE
Special Requests: ADEQUATE

## 2023-02-04 LAB — GLUCOSE, CAPILLARY
Glucose-Capillary: 153 mg/dL — ABNORMAL HIGH (ref 70–99)
Glucose-Capillary: 153 mg/dL — ABNORMAL HIGH (ref 70–99)
Glucose-Capillary: 159 mg/dL — ABNORMAL HIGH (ref 70–99)
Glucose-Capillary: 158 mg/dL — ABNORMAL HIGH (ref 70–99)
Glucose-Capillary: 171 mg/dL — ABNORMAL HIGH (ref 70–99)
Glucose-Capillary: 235 mg/dL — ABNORMAL HIGH (ref 70–99)

## 2023-02-04 LAB — COOXEMETRY PANEL
Carboxyhemoglobin: 1.3 % (ref 0.5–1.5)
Methemoglobin: <0.7 % (ref 0.0–1.5)
O2 Saturation: 53.3 %
Total hemoglobin: 13 g/dL (ref 12.0–16.0)

## 2023-02-04 LAB — TRIGLYCERIDES: Triglycerides: 271 mg/dL — ABNORMAL HIGH (ref <150)

## 2023-02-04 MED ORDER — LINEZOLID 600 MG/300ML IV SOLN
600.0000 mg | Freq: Two times a day (BID) | INTRAVENOUS | Status: AC
  Administered 2023-02-04 – 2023-02-10 (×14): 600 mg via INTRAVENOUS
  Filled 2023-02-04 (×17): qty 300

## 2023-02-04 MED ORDER — GERHARDT'S BUTT CREAM
TOPICAL_CREAM | Freq: Every day | CUTANEOUS | Status: DC
  Administered 2023-02-07 – 2023-02-16 (×6): 1 via TOPICAL
  Filled 2023-02-04 (×3): qty 1

## 2023-02-04 MED ORDER — PRISMASOL BGK 4/2.5 32-4-2.5 MEQ/L REPLACEMENT SOLN: Status: DC

## 2023-02-04 MED ORDER — PRISMASOL B22GK 4/0 22-4 MEQ/L EC SOLN
Status: DC
  Filled 2023-02-04 (×13): qty 5000

## 2023-02-04 MED ORDER — JUVEN PO PACK
1.0000 | PACK | Freq: Two times a day (BID) | ORAL | Status: DC
  Administered 2023-02-04 – 2023-03-04 (×51): 1
  Filled 2023-02-04 (×51): qty 1

## 2023-02-04 MED ORDER — ACD FORMULA A 0.73-2.45-2.2 GM/100ML VI SOLN
3000.0000 mL | Status: DC
  Administered 2023-02-04 – 2023-02-10 (×15): 3000 mL via INTRAVENOUS_CENTRAL
  Administered 2023-02-10: 380 mL via INTRAVENOUS_CENTRAL
  Administered 2023-02-10 – 2023-02-27 (×37): 3000 mL via INTRAVENOUS_CENTRAL
  Administered 2023-02-27: 1000 mL via INTRAVENOUS_CENTRAL
  Filled 2023-02-04 (×59): qty 3000
  Filled 2023-02-04: qty 1000
  Filled 2023-02-04 (×6): qty 3000

## 2023-02-04 MED ORDER — DEXTROSE 5 % IV SOLN
20.0000 g | INTRAVENOUS | Status: DC
  Administered 2023-02-04 – 2023-02-27 (×26): 20 g via INTRAVENOUS_CENTRAL
  Filled 2023-02-04 (×31): qty 200

## 2023-02-04 MED ORDER — ANTICOAGULANT SODIUM CITRATE 4% (200MG/5ML) IV SOLN
5.0000 mL | Status: DC | PRN
  Administered 2023-02-27 – 2023-02-28 (×3): 2.8 mL via INTRAVENOUS_CENTRAL
  Administered 2023-03-01: 5 mL via INTRAVENOUS_CENTRAL
  Filled 2023-02-04 (×9): qty 5

## 2023-02-04 NOTE — Plan of Care (Signed)
°  Problem: Education: °Goal: Knowledge of General Education information will improve °Description: Including pain rating scale, medication(s)/side effects and non-pharmacologic comfort measures °Outcome: Not Progressing °  °Problem: Health Behavior/Discharge Planning: °Goal: Ability to manage health-related needs will improve °Outcome: Not Progressing °  °Problem: Clinical Measurements: °Goal: Ability to maintain clinical measurements within normal limits will improve °Outcome: Not Progressing °Goal: Will remain free from infection °Outcome: Not Progressing °Goal: Diagnostic test results will improve °Outcome: Not Progressing °Goal: Respiratory complications will improve °Outcome: Not Progressing °Goal: Cardiovascular complication will be avoided °Outcome: Not Progressing °  °Problem: Activity: °Goal: Risk for activity intolerance will decrease °Outcome: Not Progressing °  °Problem: Nutrition: °Goal: Adequate nutrition will be maintained °Outcome: Not Progressing °  °Problem: Coping: °Goal: Level of anxiety will decrease °Outcome: Not Progressing °  °Problem: Elimination: °Goal: Will not experience complications related to bowel motility °Outcome: Not Progressing °Goal: Will not experience complications related to urinary retention °Outcome: Not Progressing °  °

## 2023-02-04 NOTE — Progress Notes (Signed)
Palliative Team - Support Visit  Met with patient's mother to offer on-going support. Mother complimentary of palliative team and 2 heart unit staff. Provided compassionate presence and offered emotional and general support through therapeutic listening, empathy, and sharing of stories.   Will continue to support as needed.  Barrie Folk MS Ed.S, RN Palliative Medicine Team  Team Phone: [phone]

## 2023-02-04 NOTE — Progress Notes (Signed)
Nutrition Follow-up  DOCUMENTATION CODES:   Morbid obesity  INTERVENTION:   Tube Feeding via Cortrak:  Vital 1.5 at 50 ml/hr Increase Pro-Source TF20 60 mL to every 4 hours TF regimen at goal provides 2280 kcals, 201 g of protein and 1003 mL of free wat   Pt receiving additional calories in form of dextrose via calcium dextrose (ACD-A anticoagulant) for CRRT   Continue Renal MVI daily  Add Juven BID, each packet provides 80 calories, 8 grams of carbohydrate, 2.5  grams of protein (collagen), 7 grams of L-arginine and 7 grams of L-glutamine; supplement contains CaHMB, Vitamins C, E, B12 and Zinc to promote wound healing   NUTRITION DIAGNOSIS:   Inadequate oral intake related to inability to eat as evidenced by NPO status.  Being addressed via TF  GOAL:   Patient will meet greater than or equal to 90% of their needs  Met via TF  MONITOR:   Vent status, TF tolerance, Labs, Weight trends  REASON FOR ASSESSMENT:   Ventilator, Consult Assessment of nutrition requirement/status  ASSESSMENT:   37 year old male with PMHx of ESRD on HD, nonischemic cardiomyopathy, refractory A-fib, possible amio lung toxicity admitted with acute on chronic hypoxmic respiratory failure in setting of virus and missed HD, probable sepsis.  Pt remains on vent support Levophed at 18, vasopressin 0.04. CRRT held yesterday after filter clotted. Noted CRRT reinitiated today at request of family.  Hypercalcemia due to citrate protocol Febrile, noted WBC up  Tolerating Vital 1.5 at 50 ml/hr via Cortrak  Notified by Palliative Care that family had concerns about patient's CBGs and inquiring if the TF could be causing high blood sugar.   RD reviewed CBG trends and blood sugars have been ranging between 153-184. ICU goals for CBG are 140-180. Pt is currently only on sliding scale insulin. Current TF regimen is appropriate and CBGs appear well managed at present. Of note, pt is receiving additional  dextrose via calcium gluconate/citrate dextrose solution for CRRT  Noted stage II on coccyx documented on 7/11. Plan to add Juven to promote wound healing  Weight at 172 kg, down from 195 on admission. Lowest wt 170 kg  Labs: sodium 133 , potassium 5.1 (wdl), phosphorus 6.1 (H) Meds: ss novolog, renal MVI, bowel regimen now prn  Diet Order:   Diet Order     None       EDUCATION NEEDS:   No education needs have been identified at this time  Skin:  Skin Assessment: Skin Integrity Issues: Skin Integrity Issues:: Stage II Stage II: coccyx  Last BM:  7/12  Height:   Ht Readings from Last 1 Encounters:  01/21/23 6' (1.829 m)    Weight:   Wt Readings from Last 1 Encounters:  02/04/23 (!) 172.4 kg    Ideal Body Weight:  80.9 kg  BMI:  Body mass index is 51.55 kg/m.  Estimated Nutritional Needs:   Kcal:  2100-2300 kcals  Protein:  160-190 g  Fluid:  UOP + 1 L   Romelle Starcher MS, RDN, LDN, CNSC Registered Dietitian 3 Clinical Nutrition RD Pager and On-Call Pager Number Located in [place]

## 2023-02-04 NOTE — Progress Notes (Signed)
   02/04/23 1200  Spiritual Encounters  Type of Visit Follow up  Care provided to: Family  Referral source Nurse (RN/NT/LPN)  OnCall Visit No  Spiritual Framework  Presenting Themes  (fearing the medical team is killing her son)  Values/beliefs faith  Community/Connection Family  Family Stress Factors  (lack of trust in health care system)  Goals  Self/Personal Goals transfer pt out of 2h  Interventions  Spiritual Care Interventions Made Established relationship of care and support;Compassionate presence;Reflective listening;Normalization of emotions;Meaning making  Intervention Outcomes  Outcomes Awareness of support   Ch followed up with family. The mother of pt is angry because she believes that the medical team is actively trying to kill her son, especially the team in 2H. In 2022 the son was admitted to [facility] and he told his mother that the floor is a death chamber. He had requested that his mother to not allow the care team to put him on 2H because he was afraid of being on the floor. She said she does not feel the same way about the other floors. [name] mother has contacted the [employer], a lawyer, and filled a complaint with the [employer]. She pointed out the fact that the team is telling her every day that her son is going to die and refuses to transfer him as proof the team is out to kill her son. She said she is fighting for her son and every family who will be admitted to the floor. Ch provided hospitality and asked guided questions to bring forth feelings. Ch will follow-up with family.

## 2023-02-04 NOTE — Progress Notes (Signed)
Phillip Young called from [facility] transport team, they have declined Phillip Young transfer request. [employer] notified.

## 2023-02-04 NOTE — Progress Notes (Signed)
[facility] Progress Note  Subjective: I/O even yesterday. CRRT was dc'd yesterday.  Family is requesting RRT be restarted.   Vitals:   02/04/23 1315 02/04/23 1330 02/04/23 1345 02/04/23 1424  BP:      Pulse: (!) 145 (!) 132 (!) 141   Resp: (!) 34 (!) 35 (!) 21   Temp:      TempSrc:      SpO2: 96% 96% 95% 97%  Weight:      Height:        Exam: General: Ill looking male, intubated, sedated Heart: A-fib with RVR, s1s2 nl Lungs: Coarse breath sound bilateral Abdomen:soft, Non-tender, non-distended Extremities: no sig LE or UE edema Dialysis Access: RIJ TDC in place.  Site clean.    OP HD: TTS [facility]  4h  425/ 1.5  3K/2.5 bath   180kg  TDC   - mircera 200 mcg q 2 wks, last 6/13 - sensipar 60mg  three times per week - hectorol 4 mcg three times per week - doesn't stay on for full sessions, doesn't adhere to fluid restriction  CXR 7/06- vasc congestion, no edema    Assessment/ Plan: ESRD: Required emergent HD on 6/29 because of fluid overload.  Then developed shock and afib/ RVR requiring high doses of pressors.  Started CRRT on 6/29. Pt required change to citrate protocol for frequent clotting started 7/06. CRRT was stopped last night. Family is requesting renal replacement therapy be resumed.  Will resume CRRT as he is still requiring sig pressor support. Will use citrate protocol again due to hx of clotting and intolerance to heparin.   Volume:  6/29- 7/3 on CRRT was net neg 19.3 L over 5 days. CXR 7/06 was w/o edema. Wts are 5-10 kg under edw. No sign of volume overload. Keep even w/ CRRT.  Acute on chronic systolic CHF, nonischemic: EF 13-24%.   Sepsis/septic shock: strep pneumoniae grew on initial BCx's (6/28) w/ presumed PNA. Was on rocephin initially then changed to meropenem and linezolid. Still requiring Levo and vaso at high doses.  Anemia of CKD: Hemoglobin at goal.  Received Aranesp on 7/1 a nd 7/08. Hypercalcemia: due to citrate protocol. Ca++ peaked  at `12 and improved to 10.6. Cont to follow.  A-fib with RVR with hypotension: S/P cardioversion 7/5 -> NSR -> afib RVR 7/6 early AM refractory to [facility] x3 + amiodarone.      Vinson Moselle MD  [facility] 02/04/2023, 2:35 PM  Recent Labs  Lab 02/03/23 1003 02/03/23 1549 02/04/23 0412  HGB 14.6  --  12.7*  ALBUMIN  --  2.9* 2.8*  CALCIUM  --  10.5* 10.3  PHOS  --  5.4* 6.1*  CREATININE 4.20* 4.09* 5.03*  K 4.3 4.8 5.1   No results for input(s): "IRON", "TIBC", "FERRITIN" in the last 168 hours. Inpatient medications:  apixaban  5 mg Per Tube BID   Chlorhexidine Gluconate Cloth  6 each Topical Q0600   darbepoetin (ARANESP) injection - DIALYSIS  100 mcg Subcutaneous Q Mon-1800   feeding supplement (PROSource TF20)  60 mL Per Tube Q4H   fentaNYL (SUBLIMAZE) injection  50 mcg Intravenous Once   hydrOXYzine  10 mg Per Tube TID   insulin aspart  0-9 Units Subcutaneous Q4H   lidocaine  1 patch Transdermal Q24H   melatonin  5 mg Per Tube QHS   multivitamin  1 tablet Per Tube QHS   mouth rinse  15 mL Mouth Rinse Q2H   pantoprazole (PROTONIX) IV  40  mg Intravenous Q24H     prismasol BGK 4/2.5     sodium chloride Stopped (01/22/23 1859)   anticoagulant sodium citrate     calcium gluconate 20 g in dextrose 5 % 1,000 mL infusion     citrate dextrose     feeding supplement (VITAL 1.5 CAL) 50 mL/hr at 02/04/23 1200   fentaNYL infusion INTRAVENOUS 50 mcg/hr (02/04/23 1200)   linezolid (ZYVOX) IV     meropenem (MERREM) IV Stopped (02/04/23 0949)   midazolam 1 mg/hr (02/04/23 1200)   norepinephrine (LEVOPHED) Adult infusion 18 mcg/min (02/04/23 1214)   prismasol B22GK 4/0     vasopressin 0.04 Units/min (02/04/23 1200)   sodium chloride, acetaminophen **OR** acetaminophen, anticoagulant sodium citrate, bisacodyl, docusate, fentaNYL, midazolam, ondansetron (ZOFRAN) IV, mouth rinse, polyethylene glycol, polyvinyl alcohol, senna-docusate, surgical lubricant

## 2023-02-04 NOTE — Progress Notes (Signed)
NAME:  Phillip Young, MRN:  [id number], DOB:  02-20-1986, LOS: 14 ADMISSION DATE:  01/21/2023, CONSULTATION DATE:  01/22/23 REFERRING MD:  Danise Edge, CHIEF COMPLAINT:  SOB   History of Present Illness:  37 year old man well known to the PCCM and AHF services who presented to [facility] 6/28 with fevers, joint pains, SOB. PMHx significant for ESRD, NICM, refractory AFib, ?amio-induced lung toxicity. Patient reportedly missed HD on Thursday (has a history of leaving HD early). Reportedly ~10kg over dry weight with worsening breathing status, AFib/RVR and borderline BP.  PCCM consulted for ICU admission/further evaluation and management.  Pertinent Medical History:   Past Medical History:  Diagnosis Date   Acute on chronic respiratory failure with hypoxia ([name]) 04/21/2021   Acute on chronic systolic (congestive) heart failure ([name]) 02/26/2020   Amiodarone toxicity    Anemia    Atrial flutter ([name])    Biventricular congestive heart failure ([name])    Chronic hypoxemic respiratory failure ([name])    Class 3 severe obesity due to excess calories with serious comorbidity and body mass index (BMI) of 50.0 to 59.9 in adult [facility]) 02/26/2020   Essential hypertension 02/26/2020   GERD without esophagitis 02/26/2020   Hidradenitis suppurativa 02/26/2020   NICM (nonischemic cardiomyopathy) ([name])    Obesity hypoventilation syndrome ([name])    OSA (obstructive sleep apnea)    PAF (paroxysmal atrial fibrillation) ([name])    Pneumonia    Prediabetes 02/26/2020   Significant Hospital Events: Including procedures, antibiotic start and stop dates in addition to other pertinent events   6/28 Admit, emergent HD 6/29 Afib/RVR, shock, ICU transfer 6/30 Intubated and placed on vasopressor support and started on CRRT 7/1 Remains intubated on CRRT continue with pressor support 7/2 Agitated with SAT, remains on CRRT and low dose levophed 7/3 low dose NE, ongoing CRRT 7/4 Attempted SBT this am, apneic but severely agitated,  desaturated, HR up 130/140s.  Re-sedated but HR remains 140's, NE up from 6 to . NSR on EKG.  Was pulling 50-100on UF, since kept even for now.  CVP 8 7/5 DCCV again attempted this a.m. with success, remains on norepinephrine and vasopressin.  7/6 went into A-fib with RVR, attempted DCCV x 3, started on amiodarone infusion after amiodarone bolus 7/7 Multiple DCCV attempts without success. TF held due to vomiting. Tx denied to [facility]  7/8 KUB looked OK. No further vomiting. Tolerating TF. Large BM overnight.  7/9 vasopressors back up, remains rapid afib/ flutter, Bmx 1, wua causing hemodynamic instability 7/10 weaned sedation on mothers request, HR remains 140-220s, tmax 100.6, DNR, no escalation  Interim History / Subjective:  CRRT clotted overnight Remains febrile, HR sustained 160-180, remains on NE/ vaso Mother requested [facility] transfer again overnight, denied.  Wanting patient to be transferred to different facility, saying "he's telling me he wants out of here", she is not happy with his care here.     Objective:  Blood pressure (!) 149/135, pulse (!) 133, temperature 100.1 F (37.8 C), temperature source Oral, resp. rate (!) 36, height 6' (1.829 m), weight (!) 172.4 kg, SpO2 96%. CVP:  [2 mmHg-20 mmHg] 12 mmHg  Vent Mode: PRVC FiO2 (%):  [40 %] 40 % Set Rate:  [24 bmp] 24 bmp Vt Set:  [600 mL] 600 mL PEEP:  [5 cmH20] 5 cmH20 Plateau Pressure:  [14 cmH20-24 cmH20] 14 cmH20   Intake/Output Summary (Last 24 hours) at 02/04/2023 1102 Last data filed at 02/04/2023 1000 Gross per 24 hour  Intake 2422.56 ml  Output 1925.7  ml  Net 496.86 ml   Filed Weights   02/02/23 0500 02/03/23 0500 02/04/23 0500  Weight: (!) 174.7 kg (!) 171.7 kg (!) 172.4 kg   Physical Examination: Fent 50, versed 1 General:  morbidly obese critically ill adult male in bed in NAD HEENT: MM pink/moist, ETT/ OGT, pupils 3/sluggish Neuro: unresponsive to pain, RR will increase some with stimulation CV: irir,  distant  PULM:  MV supported, breathing over set rate, diffuse rales/ rhonchi, minimal secretions, no wheeze  GI: soft, +bs, FMS Extremities: warm/dry, generalized edema   CVP 13 Labs reviewed Tmax 102  Resolved Hospital Problem List:  Hematuria due to traumatic Foley insertion  Assessment & Plan:  Acute-on-chronic hypoxemic respiratory failure Bilateral multifocal pneumonia Pulmonary edema due to noncompliance with hemodialysis/AonC HF OHS/OSA, noncompliant with CPAP -  cont full MV support, 4-8cc/kg IBW with goal Pplat <30 and DP<15  - VAP prevention protocol/ PPI - PAD protocol for sedation> fentanyl /versed> minimized - SBT deferred to hemodynamics - abx as below  Combined cardiogenic and septic shock due to strep pneumo bacteremia, POA - BCx grew strep pneumo, likely secondary to bilateral lower lobe pneumonia. - Broadened to linezolid and meropenem with worsening pressor demand 7/7 > narrowed to meropenem alone with negative MRSA PCR.  Still febrile.  Add linezolid.   - Repeat BC neg from 7/7 - cont NE/ vasopressin support, MAP goal > 65 - DNR, no escalation of care.   Acute on chronic HFrEF Nonischemic cardiomyopathy Echocardiogram 5/24 with unchanged EF of 20 to 25% Paroxysmal rapid A-fib/flutter with RVR - History of multiple DCCVs with history of amiodarone toxicity treated with steroids - Amiodarone started on 7/7 after bolus, D/C'd 7/7 at family request.  - [facility] contacted 7/7 for potential transfer. Did not accept due to capacity and lack of additional therapies to offer. Denied again 7/10 and 7/12 - cont Eliquis for stroke prophylaxis - not a candidate for GDMT in the setting of shock - despite maximum medical therapy, vasopressor support, HR remains uncontrolled, unable to tolerate volume removal w/CRRT, ongoing hemodynamic instability, and fevers despite broad abx.  HF team signed off 7/10, recs for ongoing palliative care measures.  Not a candidate for any other  advanced therapies and patient has failed all other therapies in the setting of MODS, BMI, and numerous co-morbid conditions.  Ongoing recommendations for palliation relayed to family.  Appreciate PMT assistance.  DNR with no escalation in care as of 7/10.  Ongoing family support.   End-stage renal disease on hemodialysis Hypervolemic hyponatremia Hyperphosphatemia Hyperkalemia  High anion gap metabolic acidosis Hypercalcemia Patient is noncompliant with hemodialysis - Nephrology following - CRRT clotted overnight.  Given no escalation, not restarting.    Acute septic encephalopathy - PAD as above, have minimized per mother's request, patient remains unresponsive to noxious stimuli  Morbid obesity Nutrition - TF per RD - TF stopped 7/7 for vomiting. KUB ok. Trickles started 7/8. Tolerated well. Large BM  7/9, now with FMS.   - tolerating TF  Goals of care: - 7/12, appreciate PMT assistance.  Remains DNR, no escalation of care as noted above.  Mother remains challenging to interact with as she verbalizes acceptance in end-stage MODS and that patient is dying, still holding hope in a miracle from God, but then also verbalizes mistrust in staff, we have lied to her, and doesn't seem to have medical understanding of his disease processes despite multiple repeated conversations over the last several days by different providers. Ongoing emotional  support provided.    Best Practice (right click and "Reselect all SmartList Selections" daily)   Diet/type: tube feeds DVT prophylaxis: DOAC GI prophylaxis: Protonix Lines: Yes still needed Foley: n/a Code Status:  DNR 7/10 Last date of multidisciplinary goals of care: 7/9: See "goals of care" above   Critical care time  42 minutes    Posey Boyer, MSN, NP, AG-ACNP-BC [facility] 02/04/2023, 11:02 AM  See Amion for pager If no response to pager , please call [phone] until 7pm After 7:00 pm call Elink   [phone]

## 2023-02-05 DIAGNOSIS — J9601 Acute respiratory failure with hypoxia: Secondary | ICD-10-CM | POA: Diagnosis not present

## 2023-02-05 LAB — CBC WITH DIFFERENTIAL/PLATELET
Abs Immature Granulocytes: 0 10*3/uL (ref 0.00–0.07)
Basophils Absolute: 0 10*3/uL (ref 0.0–0.1)
Basophils Relative: 0 %
Eosinophils Absolute: 0 10*3/uL (ref 0.0–0.5)
Eosinophils Relative: 0 %
HCT: 37 % — ABNORMAL LOW (ref 39.0–52.0)
Hemoglobin: 11.7 g/dL — ABNORMAL LOW (ref 13.0–17.0)
Lymphocytes Relative: 5 %
Lymphs Abs: 1.3 10*3/uL (ref 0.7–4.0)
MCH: 32.9 pg (ref 26.0–34.0)
MCHC: 31.6 g/dL (ref 30.0–36.0)
MCV: 103.9 fL — ABNORMAL HIGH (ref 80.0–100.0)
Monocytes Absolute: 1.3 10*3/uL — ABNORMAL HIGH (ref 0.1–1.0)
Monocytes Relative: 5 %
Neutro Abs: 22.8 10*3/uL — ABNORMAL HIGH (ref 1.7–7.7)
Neutrophils Relative %: 90 %
Platelets: 219 10*3/uL (ref 150–400)
RBC: 3.56 MIL/uL — ABNORMAL LOW (ref 4.22–5.81)
RDW: 18.1 % — ABNORMAL HIGH (ref 11.5–15.5)
WBC: 25.3 10*3/uL — ABNORMAL HIGH (ref 4.0–10.5)
nRBC: 0 /100 WBC
nRBC: 0.1 % (ref 0.0–0.2)

## 2023-02-05 LAB — POCT I-STAT, CHEM 8
BUN: 102 mg/dL — ABNORMAL HIGH (ref 6–20)
BUN: 103 mg/dL — ABNORMAL HIGH (ref 6–20)
BUN: 103 mg/dL — ABNORMAL HIGH (ref 6–20)
BUN: 106 mg/dL — ABNORMAL HIGH (ref 6–20)
BUN: 115 mg/dL — ABNORMAL HIGH (ref 6–20)
BUN: 118 mg/dL — ABNORMAL HIGH (ref 6–20)
BUN: 70 mg/dL — ABNORMAL HIGH (ref 6–20)
BUN: 74 mg/dL — ABNORMAL HIGH (ref 6–20)
BUN: 88 mg/dL — ABNORMAL HIGH (ref 6–20)
BUN: 94 mg/dL — ABNORMAL HIGH (ref 6–20)
Calcium, Ion: 0.35 mmol/L — CL (ref 1.15–1.40)
Calcium, Ion: 0.38 mmol/L — CL (ref 1.15–1.40)
Calcium, Ion: 0.39 mmol/L — CL (ref 1.15–1.40)
Calcium, Ion: 0.41 mmol/L — CL (ref 1.15–1.40)
Calcium, Ion: 0.46 mmol/L — CL (ref 1.15–1.40)
Calcium, Ion: 0.98 mmol/L — ABNORMAL LOW (ref 1.15–1.40)
Calcium, Ion: 0.99 mmol/L — ABNORMAL LOW (ref 1.15–1.40)
Calcium, Ion: 1 mmol/L — ABNORMAL LOW (ref 1.15–1.40)
Calcium, Ion: 1.07 mmol/L — ABNORMAL LOW (ref 1.15–1.40)
Calcium, Ion: 1.17 mmol/L (ref 1.15–1.40)
Chloride: 100 mmol/L (ref 98–111)
Chloride: 100 mmol/L (ref 98–111)
Chloride: 100 mmol/L (ref 98–111)
Chloride: 93 mmol/L — ABNORMAL LOW (ref 98–111)
Chloride: 96 mmol/L — ABNORMAL LOW (ref 98–111)
Chloride: 96 mmol/L — ABNORMAL LOW (ref 98–111)
Chloride: 97 mmol/L — ABNORMAL LOW (ref 98–111)
Chloride: 97 mmol/L — ABNORMAL LOW (ref 98–111)
Chloride: 98 mmol/L (ref 98–111)
Chloride: 98 mmol/L (ref 98–111)
Creatinine, Ser: 3.1 mg/dL — ABNORMAL HIGH (ref 0.61–1.24)
Creatinine, Ser: 3.7 mg/dL — ABNORMAL HIGH (ref 0.61–1.24)
Creatinine, Ser: 4.2 mg/dL — ABNORMAL HIGH (ref 0.61–1.24)
Creatinine, Ser: 4.3 mg/dL — ABNORMAL HIGH (ref 0.61–1.24)
Creatinine, Ser: 4.5 mg/dL — ABNORMAL HIGH (ref 0.61–1.24)
Creatinine, Ser: 4.6 mg/dL — ABNORMAL HIGH (ref 0.61–1.24)
Creatinine, Ser: 4.6 mg/dL — ABNORMAL HIGH (ref 0.61–1.24)
Creatinine, Ser: 5.4 mg/dL — ABNORMAL HIGH (ref 0.61–1.24)
Creatinine, Ser: 5.8 mg/dL — ABNORMAL HIGH (ref 0.61–1.24)
Creatinine, Ser: 6.4 mg/dL — ABNORMAL HIGH (ref 0.61–1.24)
Glucose, Bld: 161 mg/dL — ABNORMAL HIGH (ref 70–99)
Glucose, Bld: 194 mg/dL — ABNORMAL HIGH (ref 70–99)
Glucose, Bld: 224 mg/dL — ABNORMAL HIGH (ref 70–99)
Glucose, Bld: 244 mg/dL — ABNORMAL HIGH (ref 70–99)
Glucose, Bld: 246 mg/dL — ABNORMAL HIGH (ref 70–99)
Glucose, Bld: 251 mg/dL — ABNORMAL HIGH (ref 70–99)
Glucose, Bld: 256 mg/dL — ABNORMAL HIGH (ref 70–99)
Glucose, Bld: 263 mg/dL — ABNORMAL HIGH (ref 70–99)
Glucose, Bld: 263 mg/dL — ABNORMAL HIGH (ref 70–99)
Glucose, Bld: 272 mg/dL — ABNORMAL HIGH (ref 70–99)
HCT: 35 % — ABNORMAL LOW (ref 39.0–52.0)
HCT: 36 % — ABNORMAL LOW (ref 39.0–52.0)
HCT: 37 % — ABNORMAL LOW (ref 39.0–52.0)
HCT: 37 % — ABNORMAL LOW (ref 39.0–52.0)
HCT: 38 % — ABNORMAL LOW (ref 39.0–52.0)
HCT: 42 % (ref 39.0–52.0)
HCT: 42 % (ref 39.0–52.0)
HCT: 43 % (ref 39.0–52.0)
HCT: 43 % (ref 39.0–52.0)
HCT: 45 % (ref 39.0–52.0)
Hemoglobin: 11.9 g/dL — ABNORMAL LOW (ref 13.0–17.0)
Hemoglobin: 12.2 g/dL — ABNORMAL LOW (ref 13.0–17.0)
Hemoglobin: 12.6 g/dL — ABNORMAL LOW (ref 13.0–17.0)
Hemoglobin: 12.6 g/dL — ABNORMAL LOW (ref 13.0–17.0)
Hemoglobin: 12.9 g/dL — ABNORMAL LOW (ref 13.0–17.0)
Hemoglobin: 14.3 g/dL (ref 13.0–17.0)
Hemoglobin: 14.3 g/dL (ref 13.0–17.0)
Hemoglobin: 14.6 g/dL (ref 13.0–17.0)
Hemoglobin: 14.6 g/dL (ref 13.0–17.0)
Hemoglobin: 15.3 g/dL (ref 13.0–17.0)
Potassium: 4.4 mmol/L (ref 3.5–5.1)
Potassium: 4.6 mmol/L (ref 3.5–5.1)
Potassium: 4.8 mmol/L (ref 3.5–5.1)
Potassium: 5 mmol/L (ref 3.5–5.1)
Potassium: 5.1 mmol/L (ref 3.5–5.1)
Potassium: 5.1 mmol/L (ref 3.5–5.1)
Potassium: 5.1 mmol/L (ref 3.5–5.1)
Potassium: 5.3 mmol/L — ABNORMAL HIGH (ref 3.5–5.1)
Potassium: 5.4 mmol/L — ABNORMAL HIGH (ref 3.5–5.1)
Potassium: 5.4 mmol/L — ABNORMAL HIGH (ref 3.5–5.1)
Sodium: 132 mmol/L — ABNORMAL LOW (ref 135–145)
Sodium: 132 mmol/L — ABNORMAL LOW (ref 135–145)
Sodium: 133 mmol/L — ABNORMAL LOW (ref 135–145)
Sodium: 133 mmol/L — ABNORMAL LOW (ref 135–145)
Sodium: 134 mmol/L — ABNORMAL LOW (ref 135–145)
Sodium: 134 mmol/L — ABNORMAL LOW (ref 135–145)
Sodium: 135 mmol/L (ref 135–145)
Sodium: 135 mmol/L (ref 135–145)
Sodium: 135 mmol/L (ref 135–145)
Sodium: 136 mmol/L (ref 135–145)
TCO2: 20 mmol/L — ABNORMAL LOW (ref 22–32)
TCO2: 22 mmol/L (ref 22–32)
TCO2: 23 mmol/L (ref 22–32)
TCO2: 24 mmol/L (ref 22–32)
TCO2: 24 mmol/L (ref 22–32)
TCO2: 24 mmol/L (ref 22–32)
TCO2: 25 mmol/L (ref 22–32)
TCO2: 25 mmol/L (ref 22–32)
TCO2: 26 mmol/L (ref 22–32)
TCO2: 27 mmol/L (ref 22–32)

## 2023-02-05 LAB — RENAL FUNCTION PANEL
Albumin: 2.7 g/dL — ABNORMAL LOW (ref 3.5–5.0)
Albumin: 2.5 g/dL — ABNORMAL LOW (ref 3.5–5.0)
Anion gap: 19 — ABNORMAL HIGH (ref 5–15)
Anion gap: 20 — ABNORMAL HIGH (ref 5–15)
BUN: 98 mg/dL — ABNORMAL HIGH (ref 6–20)
BUN: 79 mg/dL — ABNORMAL HIGH (ref 6–20)
CO2: 21 mmol/L — ABNORMAL LOW (ref 22–32)
CO2: 20 mmol/L — ABNORMAL LOW (ref 22–32)
Calcium: 9.9 mg/dL (ref 8.9–10.3)
Calcium: 10.2 mg/dL (ref 8.9–10.3)
Chloride: 93 mmol/L — ABNORMAL LOW (ref 98–111)
Chloride: 93 mmol/L — ABNORMAL LOW (ref 98–111)
Creatinine, Ser: 5.61 mg/dL — ABNORMAL HIGH (ref 0.61–1.24)
Creatinine, Ser: 4.13 mg/dL — ABNORMAL HIGH (ref 0.61–1.24)
GFR, Estimated: 13 mL/min — ABNORMAL LOW (ref >60)
GFR, Estimated: 18 mL/min — ABNORMAL LOW (ref >60)
Glucose, Bld: 239 mg/dL — ABNORMAL HIGH (ref 70–99)
Glucose, Bld: 179 mg/dL — ABNORMAL HIGH (ref 70–99)
Phosphorus: 6.1 mg/dL — ABNORMAL HIGH (ref 2.5–4.6)
Phosphorus: 5.6 mg/dL — ABNORMAL HIGH (ref 2.5–4.6)
Potassium: 5.5 mmol/L — ABNORMAL HIGH (ref 3.5–5.1)
Potassium: 4.9 mmol/L (ref 3.5–5.1)
Sodium: 133 mmol/L — ABNORMAL LOW (ref 135–145)
Sodium: 133 mmol/L — ABNORMAL LOW (ref 135–145)

## 2023-02-05 LAB — GLUCOSE, CAPILLARY
Glucose-Capillary: 161 mg/dL — ABNORMAL HIGH (ref 70–99)
Glucose-Capillary: 225 mg/dL — ABNORMAL HIGH (ref 70–99)
Glucose-Capillary: 232 mg/dL — ABNORMAL HIGH (ref 70–99)
Glucose-Capillary: 209 mg/dL — ABNORMAL HIGH (ref 70–99)
Glucose-Capillary: 168 mg/dL — ABNORMAL HIGH (ref 70–99)
Glucose-Capillary: 172 mg/dL — ABNORMAL HIGH (ref 70–99)

## 2023-02-05 LAB — COOXEMETRY PANEL
Carboxyhemoglobin: 1.8 % — ABNORMAL HIGH (ref 0.5–1.5)
Methemoglobin: 0.9 % (ref 0.0–1.5)
O2 Saturation: 65.9 %
Total hemoglobin: 12.4 g/dL (ref 12.0–16.0)

## 2023-02-05 LAB — MAGNESIUM: Magnesium: 2.7 mg/dL — ABNORMAL HIGH (ref 1.7–2.4)

## 2023-02-05 LAB — TRIGLYCERIDES: Triglycerides: 290 mg/dL — ABNORMAL HIGH (ref <150)

## 2023-02-05 LAB — CALCIUM, IONIZED: Calcium, Ionized, Serum: 4.6 mg/dL (ref 4.5–5.6)

## 2023-02-05 MED ORDER — PRISMASOL B22GK 4/0 22-4 MEQ/L EC SOLN
Status: DC
  Filled 2023-02-05 (×6): qty 5000

## 2023-02-05 MED ORDER — AMIODARONE HCL IN DEXTROSE 360-4.14 MG/200ML-% IV SOLN
60.0000 mg/h | INTRAVENOUS | Status: AC
  Administered 2023-02-05 (×2): 60 mg/h via INTRAVENOUS
  Filled 2023-02-05 (×2): qty 200

## 2023-02-05 MED ORDER — INSULIN GLARGINE-YFGN 100 UNIT/ML ~~LOC~~ SOLN
14.0000 [IU] | Freq: Every day | SUBCUTANEOUS | Status: DC
  Administered 2023-02-05 – 2023-02-12 (×8): 14 [IU] via SUBCUTANEOUS
  Filled 2023-02-05 (×9): qty 0.14

## 2023-02-05 MED ORDER — AMIODARONE HCL IN DEXTROSE 360-4.14 MG/200ML-% IV SOLN
30.0000 mg/h | INTRAVENOUS | Status: DC
  Administered 2023-02-05 – 2023-02-08 (×7): 30 mg/h via INTRAVENOUS
  Filled 2023-02-05 (×5): qty 200

## 2023-02-05 MED ORDER — MICROFIBRILLAR COLL HEMOSTAT EX POWD
1.0000 g | Freq: Once | CUTANEOUS | Status: DC
  Filled 2023-02-05: qty 5

## 2023-02-05 MED ORDER — PRISMASOL BGK 4/2.5 32-4-2.5 MEQ/L EC SOLN: Status: DC

## 2023-02-05 MED ORDER — AMIODARONE LOAD VIA INFUSION
150.0000 mg | Freq: Once | INTRAVENOUS | Status: AC
  Administered 2023-02-05: 150 mg via INTRAVENOUS
  Filled 2023-02-05: qty 83.34

## 2023-02-05 MED ORDER — MIDODRINE HCL 5 MG PO TABS
10.0000 mg | ORAL_TABLET | Freq: Three times a day (TID) | ORAL | Status: DC
  Administered 2023-02-05 – 2023-02-09 (×12): 10 mg
  Filled 2023-02-05 (×12): qty 2

## 2023-02-05 MED ORDER — INSULIN GLARGINE-YFGN 100 UNIT/ML ~~LOC~~ SOLN: 14.0000 [IU] | Freq: Two times a day (BID) | SUBCUTANEOUS | Status: DC

## 2023-02-05 NOTE — Progress Notes (Signed)
NAME:  Phillip Young, MRN:  [id number], DOB:  [birth year], LOS: 15 ADMISSION DATE:  01/21/2023, CONSULTATION DATE:  01/22/23 REFERRING MD:  Danise Edge, CHIEF COMPLAINT:  SOB   History of Present Illness:  37 year old man well known to the PCCM and AHF services who presented to [facility] 6/28 with fevers, joint pains, SOB. PMHx significant for ESRD, NICM, refractory AFib, ?amio-induced lung toxicity. Patient reportedly missed HD on Thursday (has a history of leaving HD early). Reportedly ~10kg over dry weight with worsening breathing status, AFib/RVR and borderline BP.  PCCM consulted for ICU admission/further evaluation and management.  Pertinent Medical History:   Past Medical History:  Diagnosis Date   Acute on chronic respiratory failure with hypoxia ([name]) 04/21/2021   Acute on chronic systolic (congestive) heart failure ([name]) 02/26/2020   Amiodarone toxicity    Anemia    Atrial flutter ([name])    Biventricular congestive heart failure ([name])    Chronic hypoxemic respiratory failure ([name])    Class 3 severe obesity due to excess calories with serious comorbidity and body mass index (BMI) of 50.0 to 59.9 in adult [facility]) 02/26/2020   Essential hypertension 02/26/2020   GERD without esophagitis 02/26/2020   Hidradenitis suppurativa 02/26/2020   NICM (nonischemic cardiomyopathy) ([name])    Obesity hypoventilation syndrome ([name])    OSA (obstructive sleep apnea)    PAF (paroxysmal atrial fibrillation) ([name])    Pneumonia    Prediabetes 02/26/2020   Significant Hospital Events: Including procedures, antibiotic start and stop dates in addition to other pertinent events   6/28 Admit, emergent HD 6/29 Afib/RVR, shock, ICU transfer 6/30 Intubated and placed on vasopressor support and started on CRRT 7/1 Remains intubated on CRRT continue with pressor support 7/2 Agitated with SAT, remains on CRRT and low dose levophed 7/3 low dose NE, ongoing CRRT 7/4 Attempted SBT this am, apneic but severely agitated,  desaturated, HR up 130/140s.  Re-sedated but HR remains 140's, NE up from 6 to . NSR on EKG.  Was pulling 50-100on UF, since kept even for now.  CVP 8 7/5 DCCV again attempted this a.m. with success, remains on norepinephrine and vasopressin.  7/6 went into A-fib with RVR, attempted DCCV x 3, started on amiodarone infusion after amiodarone bolus 7/7 Multiple DCCV attempts without success. TF held due to vomiting. Tx denied to [facility]  7/8 KUB looked OK. No further vomiting. Tolerating TF. Large BM overnight.  7/9 vasopressors back up, remains rapid afib/ flutter, Bmx 1, wua causing hemodynamic instability 7/10 weaned sedation on mothers request, HR remains 140-220s, tmax 100.6, DNR, no escalation  Interim History / Subjective:  No events. On CRRT Afib w/ RVR unchanged Pressor needs are a bit all over the place  Objective:  Blood pressure (!) 149/135, pulse (!) 104, temperature 98.3 F (36.8 C), temperature source Oral, resp. rate (!) 27, height 6' (1.829 m), weight (!) 173.2 kg, SpO2 98%. CVP:  [12 mmHg-30 mmHg] 16 mmHg  Vent Mode: PRVC FiO2 (%):  [40 %] 40 % Set Rate:  [24 bmp] 24 bmp Vt Set:  [600 mL] 600 mL PEEP:  [5 cmH20] 5 cmH20 Plateau Pressure:  [14 cmH20] 14 cmH20   Intake/Output Summary (Last 24 hours) at 02/05/2023 0847 Last data filed at 02/05/2023 0800 Gross per 24 hour  Intake 9569.53 ml  Output 1921 ml  Net 7648.53 ml   Filed Weights   02/03/23 0500 02/04/23 0500 02/05/23 0700  Weight: (!) 171.7 kg (!) 172.4 kg (!) 173.2 kg   Physical Examination:  No distress Pupils ~75mm, brisk, equal Abd protuberant but soft, +BS Mild global anasarca Triggers vent   Patient Lines/Drains/Airways Status     Active Line/Drains/Airways     Name Placement date Placement time Site Days   Arterial Line 01/22/23 Right Other (Comment) 01/22/23  1349  Other (Comment)  14   CVC Triple Lumen 01/22/23 Left Internal jugular 01/22/23  --  -- 14   Hemodialysis Catheter Right  Internal jugular Double lumen Permanent (Tunneled) 08/20/21  1324  Internal jugular  534   NG/OG Vented/Dual Lumen 16 Fr. Oral Marking at nare/corner of mouth 65 cm 01/22/23  1500  Oral  14   Flatus Tube/Pouch 02/02/23  0630  --  3   Airway 8 mm 01/22/23  1245  -- 14   Pressure Injury 02/03/23 Coccyx Mid Stage 2 -  Partial thickness loss of dermis presenting as a shallow open injury with a red, pink wound bed without slough. 02/03/23  0800  -- 2   Wound / Incision (Open or Dehisced) 08/05/22 Ankle Anterior;Right;Lateral open wound wrapped with abd and kerlix. weeping 08/05/22  --  Ankle  184   Wound / Incision (Open or Dehisced) 08/05/22 Ankle Anterior;Left;Lateral open wounds wrapped with abd and kerlix, weeping 08/05/22  --  Ankle  184   Wound / Incision (Open or Dehisced) 08/06/22 Skin tear Thigh Posterior;Proximal;Right 08/06/22  --  Thigh  183   Wound / Incision (Open or Dehisced) 02/04/23 Skin tear Hip Left;Posterior small circular skin tears on left hip and back 02/04/23  1345  Hip  1           Sugars are up Coox 65 on levo 30, vaso 0.04 WBC improved Stable mild anemia K improving with CRRT No chest imaging Fever curve improved w/ addition of linezolid Culture data except for 6/28 have been neg  Resolved Hospital Problem List:  Hematuria due to traumatic Foley insertion  Assessment & Plan:  Acute-on-chronic hypoxemic respiratory failure Bilateral multifocal pneumonia- initially strep, question if superimposed HCAP Pulmonary edema due to noncompliance with hemodialysis/AonC HF; stable OHS/OSA, noncompliant with CPAP Acute on chronic HFrEF Paroxysmal rapid A-fib/flutter with RVR- see previous notes, seems impossible to treat, some question of previous amio lung toxicity (05/2022): reviewed the imaging, nodular changes were fairly minimal, I think benefit outweighs risks at this point Nonischemic cardiomyopathy Ongoing shock state- sepsis being treated, WBC coming down, fever  curve improving, underlying shock may be more renal vasoplegia so think we can be more liberal w/ alpha agonism.  [facility] has been contacted: (A) no beds and (B) no additional treatments to offer. End-stage renal disease on hemodialysis Hypervolemic hyponatremia Hyperphosphatemia Hyperkalemia  High anion gap metabolic acidosis Hypercalcemia  - Add back amio and keep it going IV or PO - Add midodrine, levo/vaso for MAP 65 - Fent/versed gtt as ordered, pain/anxiety drives up his AVR - CRRT for now keep even - Ongoing GOC talks, family is not ready at this time to consider transitioning to a natural death, prognosis remains grim - Check AM CXR - Trend Pct, ESR - Mother updated at bedside  Best Practice (right click and "Reselect all SmartList Selections" daily)   Diet/type: tube feeds DVT prophylaxis: DOAC GI prophylaxis: Protonix Lines: Yes still needed Foley: n/a Code Status:  Full Last date of multidisciplinary goals of care: 02/04/23   34 min cc time  Myrla Halsted MD PCCM [facility] 02/05/2023, 8:47 AM  See Amion for pager If no response to  pager , please call [phone] until 7pm After 7:00 pm call Elink  [phone]

## 2023-02-05 NOTE — Progress Notes (Signed)
[facility] Progress Note  Subjective: I/O even yesterday. CRRT was dc'd yesterday.  Family is requesting RRT be restarted.   Vitals:   02/05/23 1130 02/05/23 1145 02/05/23 1153 02/05/23 1200  BP:      Pulse: (!) 113 (!) 119  (!) 129  Resp: (!) 30 (!) 30  (!) 31  Temp:    99.2 F (37.3 C)  TempSrc:    Oral  SpO2: 98% 98% 98% 96%  Weight:      Height:        Exam: General: Ill looking male, intubated, sedated Heart: A-fib with RVR, s1s2 nl Lungs: Coarse breath sound bilateral Abdomen:soft, Non-tender, non-distended Extremities: no sig LE or UE edema Dialysis Access: RIJ TDC in place.  Site clean.    OP HD: TTS [facility]  4h  425/ 1.5  3K/2.5 bath   180kg  TDC   - mircera 200 mcg q 2 wks, last 6/13 - sensipar 60mg  three times per week - hectorol 4 mcg three times per week - doesn't stay on for full sessions, doesn't adhere to fluid restriction  CXR 7/06- vasc congestion, no edema    Assessment/ Plan: Sepsis/septic shock: strep pneumoniae grew on initial BCx's (6/28) w/ presumed PNA. Was on rocephin initially then changed to meropenem and linezolid. Still requiring Levo and vaso at high doses.  ESRD: Required emergent HD on 6/29 because of fluid overload.  Then developed shock and afib/ RVR requiring high doses of pressors.  Started CRRT on 6/29. CRRT was stopped briefly 7/11 then resumed 7/12. Cont CRRT.  CRRT anticoagulation - pt required change to citrate protocol for frequent clotting started 7/06.  We cont using citrate protocol due to excessive clotting and intolerance to heparin. Today we are notified there is a shortage of the dialysate (low bicarb/ zero Ca) designed for citrate protocol.  Will lower dialysate to 1000 cc/hr for now, and ^post-filter fluids equally, to keep clearance rates acceptable.  Volume:  6/29- 7/3 on CRRT was net neg 19.3 L over 5 days. CXR 7/06 was w/o edema. Wts are 5-10 kg under edw. No sign of volume overload. Keeping even w/  CRRT.  Acute on chronic systolic CHF, nonischemic: EF 16-10%.  Anemia of CKD: Hemoglobin at goal.  Received Aranesp on 7/1 a nd 7/08. Hypercalcemia: due to citrate protocol. Ca++ peaked at `12 and improved now down to 9-10 range. Cont to follow.  A-fib with RVR with hypotension: S/P cardioversion 7/5 -> NSR -> afib RVR 7/6 early AM refractory to [facility] x3 + amiodarone.      Vinson Moselle MD  [facility] 02/05/2023, 1:43 PM  Recent Labs  Lab 02/04/23 1636 02/04/23 2006 02/05/23 0401 02/05/23 0729 02/05/23 1238 02/05/23 1241  HGB  --    < > 11.7*   < > 12.9* 15.3  ALBUMIN 2.7*  --  2.7*  --   --   --   CALCIUM 9.7  --  9.9  --   --   --   PHOS 6.9*  --  6.1*  --   --   --   CREATININE 6.77*   < > 5.61*   < > 4.60* 3.70*  K 6.4*   < > 5.5*   < > 5.1 4.8   < > = values in this interval not displayed.   No results for input(s): "IRON", "TIBC", "FERRITIN" in the last 168 hours. Inpatient medications:  apixaban  5 mg Per Tube BID   Chlorhexidine Gluconate Cloth  6 each Topical Q0600   darbepoetin (ARANESP) injection - DIALYSIS  100 mcg Subcutaneous Q Mon-1800   feeding supplement (PROSource TF20)  60 mL Per Tube Q4H   fentaNYL (SUBLIMAZE) injection  50 mcg Intravenous Once   Gerhardt's butt cream   Topical Daily   hydrOXYzine  10 mg Per Tube TID   insulin aspart  0-9 Units Subcutaneous Q4H   insulin glargine-yfgn  14 Units Subcutaneous Daily   lidocaine  1 patch Transdermal Q24H   melatonin  5 mg Per Tube QHS   midodrine  10 mg Per Tube TID WC   multivitamin  1 tablet Per Tube QHS   nutrition supplement (JUVEN)  1 packet Per Tube BID BM   mouth rinse  15 mL Mouth Rinse Q2H   pantoprazole (PROTONIX) IV  40 mg Intravenous Q24H     prismasol BGK 4/2.5 1,200 mL/hr at 02/05/23 1307   sodium chloride Stopped (01/22/23 1859)   amiodarone 60 mg/hr (02/05/23 1242)   Followed by   amiodarone     anticoagulant sodium citrate     calcium gluconate 20 g in dextrose 5 % 1,000 mL infusion 20 g  (02/05/23 1319)   citrate dextrose 60 mL/hr at 02/05/23 1200   feeding supplement (VITAL 1.5 CAL) 50 mL/hr at 02/05/23 1200   fentaNYL infusion INTRAVENOUS 50 mcg/hr (02/05/23 1329)   linezolid (ZYVOX) IV Stopped (02/05/23 1058)   meropenem (MERREM) IV Stopped (02/05/23 0912)   midazolam 2 mg/hr (02/05/23 1200)   norepinephrine (LEVOPHED) Adult infusion 31 mcg/min (02/05/23 1200)   prismasol [id number] 4/0 1,000 mL/hr at 02/05/23 1045   vasopressin 0.04 Units/min (02/05/23 1319)   sodium chloride, acetaminophen **OR** acetaminophen, anticoagulant sodium citrate, bisacodyl, docusate, fentaNYL, midazolam, ondansetron (ZOFRAN) IV, mouth rinse, polyethylene glycol, polyvinyl alcohol, senna-docusate, surgical lubricant

## 2023-02-06 ENCOUNTER — Inpatient Hospital Stay (HOSPITAL_COMMUNITY): Payer: BC Managed Care – PPO

## 2023-02-06 DIAGNOSIS — J9601 Acute respiratory failure with hypoxia: Secondary | ICD-10-CM | POA: Diagnosis not present

## 2023-02-06 LAB — POCT I-STAT, CHEM 8
BUN: 64 mg/dL — ABNORMAL HIGH (ref 6–20)
BUN: 71 mg/dL — ABNORMAL HIGH (ref 6–20)
BUN: 61 mg/dL — ABNORMAL HIGH (ref 6–20)
BUN: 57 mg/dL — ABNORMAL HIGH (ref 6–20)
BUN: 64 mg/dL — ABNORMAL HIGH (ref 6–20)
BUN: 52 mg/dL — ABNORMAL HIGH (ref 6–20)
BUN: 76 mg/dL — ABNORMAL HIGH (ref 6–20)
BUN: 54 mg/dL — ABNORMAL HIGH (ref 6–20)
BUN: 73 mg/dL — ABNORMAL HIGH (ref 6–20)
Calcium, Ion: 0.46 mmol/L — CL (ref 1.15–1.40)
Calcium, Ion: 1.11 mmol/L — ABNORMAL LOW (ref 1.15–1.40)
Calcium, Ion: 0.43 mmol/L — CL (ref 1.15–1.40)
Calcium, Ion: 0.38 mmol/L — CL (ref 1.15–1.40)
Calcium, Ion: 1.07 mmol/L — ABNORMAL LOW (ref 1.15–1.40)
Calcium, Ion: 1.09 mmol/L — ABNORMAL LOW (ref 1.15–1.40)
Calcium, Ion: 0.4 mmol/L — CL (ref 1.15–1.40)
Calcium, Ion: 0.35 mmol/L — CL (ref 1.15–1.40)
Calcium, Ion: 1.06 mmol/L — ABNORMAL LOW (ref 1.15–1.40)
Chloride: 94 mmol/L — ABNORMAL LOW (ref 98–111)
Chloride: 99 mmol/L (ref 98–111)
Chloride: 93 mmol/L — ABNORMAL LOW (ref 98–111)
Chloride: 94 mmol/L — ABNORMAL LOW (ref 98–111)
Chloride: 98 mmol/L (ref 98–111)
Chloride: 98 mmol/L (ref 98–111)
Chloride: 84 mmol/L — ABNORMAL LOW (ref 98–111)
Chloride: 94 mmol/L — ABNORMAL LOW (ref 98–111)
Chloride: 96 mmol/L — ABNORMAL LOW (ref 98–111)
Creatinine, Ser: 3 mg/dL — ABNORMAL HIGH (ref 0.61–1.24)
Creatinine, Ser: 3.8 mg/dL — ABNORMAL HIGH (ref 0.61–1.24)
Creatinine, Ser: 2.9 mg/dL — ABNORMAL HIGH (ref 0.61–1.24)
Creatinine, Ser: 2.9 mg/dL — ABNORMAL HIGH (ref 0.61–1.24)
Creatinine, Ser: 3.6 mg/dL — ABNORMAL HIGH (ref 0.61–1.24)
Creatinine, Ser: 3.4 mg/dL — ABNORMAL HIGH (ref 0.61–1.24)
Creatinine, Ser: 2.7 mg/dL — ABNORMAL HIGH (ref 0.61–1.24)
Creatinine, Ser: 2.5 mg/dL — ABNORMAL HIGH (ref 0.61–1.24)
Creatinine, Ser: 3.1 mg/dL — ABNORMAL HIGH (ref 0.61–1.24)
Glucose, Bld: 157 mg/dL — ABNORMAL HIGH (ref 70–99)
Glucose, Bld: 198 mg/dL — ABNORMAL HIGH (ref 70–99)
Glucose, Bld: 192 mg/dL — ABNORMAL HIGH (ref 70–99)
Glucose, Bld: 139 mg/dL — ABNORMAL HIGH (ref 70–99)
Glucose, Bld: 185 mg/dL — ABNORMAL HIGH (ref 70–99)
Glucose, Bld: 130 mg/dL — ABNORMAL HIGH (ref 70–99)
Glucose, Bld: 176 mg/dL — ABNORMAL HIGH (ref 70–99)
Glucose, Bld: 152 mg/dL — ABNORMAL HIGH (ref 70–99)
Glucose, Bld: 188 mg/dL — ABNORMAL HIGH (ref 70–99)
HCT: 35 % — ABNORMAL LOW (ref 39.0–52.0)
HCT: 40 % (ref 39.0–52.0)
HCT: 43 % (ref 39.0–52.0)
HCT: 34 % — ABNORMAL LOW (ref 39.0–52.0)
HCT: 44 % (ref 39.0–52.0)
HCT: 31 % — ABNORMAL LOW (ref 39.0–52.0)
HCT: 42 % (ref 39.0–52.0)
HCT: 36 % — ABNORMAL LOW (ref 39.0–52.0)
HCT: 39 % (ref 39.0–52.0)
Hemoglobin: 11.9 g/dL — ABNORMAL LOW (ref 13.0–17.0)
Hemoglobin: 13.6 g/dL (ref 13.0–17.0)
Hemoglobin: 14.6 g/dL (ref 13.0–17.0)
Hemoglobin: 11.6 g/dL — ABNORMAL LOW (ref 13.0–17.0)
Hemoglobin: 15 g/dL (ref 13.0–17.0)
Hemoglobin: 10.5 g/dL — ABNORMAL LOW (ref 13.0–17.0)
Hemoglobin: 14.3 g/dL (ref 13.0–17.0)
Hemoglobin: 12.2 g/dL — ABNORMAL LOW (ref 13.0–17.0)
Hemoglobin: 13.3 g/dL (ref 13.0–17.0)
Potassium: 4.4 mmol/L (ref 3.5–5.1)
Potassium: 4.5 mmol/L (ref 3.5–5.1)
Potassium: 4.3 mmol/L (ref 3.5–5.1)
Potassium: 4.2 mmol/L (ref 3.5–5.1)
Potassium: 4.3 mmol/L (ref 3.5–5.1)
Potassium: 4 mmol/L (ref 3.5–5.1)
Potassium: 5.2 mmol/L — ABNORMAL HIGH (ref 3.5–5.1)
Potassium: 4.1 mmol/L (ref 3.5–5.1)
Potassium: 5.8 mmol/L — ABNORMAL HIGH (ref 3.5–5.1)
Sodium: 134 mmol/L — ABNORMAL LOW (ref 135–145)
Sodium: 135 mmol/L (ref 135–145)
Sodium: 136 mmol/L (ref 135–145)
Sodium: 134 mmol/L — ABNORMAL LOW (ref 135–145)
Sodium: 135 mmol/L (ref 135–145)
Sodium: 131 mmol/L — ABNORMAL LOW (ref 135–145)
Sodium: 139 mmol/L (ref 135–145)
Sodium: 134 mmol/L — ABNORMAL LOW (ref 135–145)
Sodium: 134 mmol/L — ABNORMAL LOW (ref 135–145)
TCO2: 25 mmol/L (ref 22–32)
TCO2: 28 mmol/L (ref 22–32)
TCO2: 28 mmol/L (ref 22–32)
TCO2: 27 mmol/L (ref 22–32)
TCO2: 29 mmol/L (ref 22–32)
TCO2: 27 mmol/L (ref 22–32)
TCO2: 27 mmol/L (ref 22–32)
TCO2: 26 mmol/L (ref 22–32)
TCO2: 30 mmol/L (ref 22–32)

## 2023-02-06 LAB — GLUCOSE, CAPILLARY
Glucose-Capillary: 120 mg/dL — ABNORMAL HIGH (ref 70–99)
Glucose-Capillary: 125 mg/dL — ABNORMAL HIGH (ref 70–99)
Glucose-Capillary: 126 mg/dL — ABNORMAL HIGH (ref 70–99)
Glucose-Capillary: 132 mg/dL — ABNORMAL HIGH (ref 70–99)
Glucose-Capillary: 134 mg/dL — ABNORMAL HIGH (ref 70–99)
Glucose-Capillary: 159 mg/dL — ABNORMAL HIGH (ref 70–99)
Glucose-Capillary: 170 mg/dL — ABNORMAL HIGH (ref 70–99)

## 2023-02-06 LAB — RENAL FUNCTION PANEL
Albumin: 2.5 g/dL — ABNORMAL LOW (ref 3.5–5.0)
Albumin: 2.8 g/dL — ABNORMAL LOW (ref 3.5–5.0)
Anion gap: 23 — ABNORMAL HIGH (ref 5–15)
Anion gap: 20 — ABNORMAL HIGH (ref 5–15)
BUN: 68 mg/dL — ABNORMAL HIGH (ref 6–20)
BUN: 57 mg/dL — ABNORMAL HIGH (ref 6–20)
CO2: 24 mmol/L (ref 22–32)
CO2: 24 mmol/L (ref 22–32)
Calcium: 10.8 mg/dL — ABNORMAL HIGH (ref 8.9–10.3)
Calcium: 10.7 mg/dL — ABNORMAL HIGH (ref 8.9–10.3)
Chloride: 88 mmol/L — ABNORMAL LOW (ref 98–111)
Chloride: 90 mmol/L — ABNORMAL LOW (ref 98–111)
Creatinine, Ser: 3.5 mg/dL — ABNORMAL HIGH (ref 0.61–1.24)
Creatinine, Ser: 3.09 mg/dL — ABNORMAL HIGH (ref 0.61–1.24)
GFR, Estimated: 22 mL/min — ABNORMAL LOW (ref >60)
GFR, Estimated: 26 mL/min — ABNORMAL LOW (ref >60)
Glucose, Bld: 148 mg/dL — ABNORMAL HIGH (ref 70–99)
Glucose, Bld: 134 mg/dL — ABNORMAL HIGH (ref 70–99)
Phosphorus: 5.1 mg/dL — ABNORMAL HIGH (ref 2.5–4.6)
Phosphorus: 4.7 mg/dL — ABNORMAL HIGH (ref 2.5–4.6)
Potassium: 4.3 mmol/L (ref 3.5–5.1)
Potassium: 4.1 mmol/L (ref 3.5–5.1)
Sodium: 135 mmol/L (ref 135–145)
Sodium: 134 mmol/L — ABNORMAL LOW (ref 135–145)

## 2023-02-06 LAB — CBC
HCT: 35.2 % — ABNORMAL LOW (ref 39.0–52.0)
Hemoglobin: 10.9 g/dL — ABNORMAL LOW (ref 13.0–17.0)
MCH: 32.2 pg (ref 26.0–34.0)
MCHC: 31 g/dL (ref 30.0–36.0)
MCV: 104.1 fL — ABNORMAL HIGH (ref 80.0–100.0)
Platelets: 267 10*3/uL (ref 150–400)
RBC: 3.38 MIL/uL — ABNORMAL LOW (ref 4.22–5.81)
RDW: 18.5 % — ABNORMAL HIGH (ref 11.5–15.5)
WBC: 18.1 10*3/uL — ABNORMAL HIGH (ref 4.0–10.5)
nRBC: 0.3 % — ABNORMAL HIGH (ref 0.0–0.2)

## 2023-02-06 LAB — COOXEMETRY PANEL
Carboxyhemoglobin: 2.1 % — ABNORMAL HIGH (ref 0.5–1.5)
Methemoglobin: <0.7 % (ref 0.0–1.5)
O2 Saturation: 74.9 %
Total hemoglobin: 11.2 g/dL — ABNORMAL LOW (ref 12.0–16.0)

## 2023-02-06 LAB — SEDIMENTATION RATE: Sed Rate: 122 mm/h — ABNORMAL HIGH (ref 0–16)

## 2023-02-06 LAB — TRIGLYCERIDES: Triglycerides: 370 mg/dL — ABNORMAL HIGH (ref <150)

## 2023-02-06 LAB — MAGNESIUM: Magnesium: 2.3 mg/dL (ref 1.7–2.4)

## 2023-02-06 LAB — PROCALCITONIN: Procalcitonin: 67.45 ng/mL

## 2023-02-06 MED ORDER — ALBUMIN HUMAN 25 % IV SOLN
25.0000 g | Freq: Four times a day (QID) | INTRAVENOUS | Status: AC
  Administered 2023-02-06 – 2023-02-07 (×4): 25 g via INTRAVENOUS
  Filled 2023-02-06 (×4): qty 100

## 2023-02-06 MED ORDER — PRISMASOL B22GK 4/0 22-4 MEQ/L EC SOLN
Status: DC
  Filled 2023-02-06 (×172): qty 5000

## 2023-02-06 NOTE — Progress Notes (Signed)
NAME:  Phillip Young, MRN:  [id number], DOB:  November 16, 1985, LOS: 16 ADMISSION DATE:  01/21/2023, CONSULTATION DATE:  01/22/23 REFERRING MD:  Danise Edge, CHIEF COMPLAINT:  SOB   History of Present Illness:  37 year old man well known to the PCCM and AHF services who presented to [facility] 6/28 with fevers, joint pains, SOB. PMHx significant for ESRD, NICM, refractory AFib, ?amio-induced lung toxicity. Patient reportedly missed HD on Thursday (has a history of leaving HD early). Reportedly ~10kg over dry weight with worsening breathing status, AFib/RVR and borderline BP.  PCCM consulted for ICU admission/further evaluation and management.  Pertinent Medical History:   Past Medical History:  Diagnosis Date   Acute on chronic respiratory failure with hypoxia ([name]) 04/21/2021   Acute on chronic systolic (congestive) heart failure ([name]) 02/26/2020   Amiodarone toxicity    Anemia    Atrial flutter ([name])    Biventricular congestive heart failure ([name])    Chronic hypoxemic respiratory failure ([name])    Class 3 severe obesity due to excess calories with serious comorbidity and body mass index (BMI) of 50.0 to 59.9 in adult [facility]) 02/26/2020   Essential hypertension 02/26/2020   GERD without esophagitis 02/26/2020   Hidradenitis suppurativa 02/26/2020   NICM (nonischemic cardiomyopathy) ([name])    Obesity hypoventilation syndrome ([name])    OSA (obstructive sleep apnea)    PAF (paroxysmal atrial fibrillation) ([name])    Pneumonia    Prediabetes 02/26/2020   Significant Hospital Events: Including procedures, antibiotic start and stop dates in addition to other pertinent events   6/28 Admit, emergent HD 6/29 Afib/RVR, shock, ICU transfer 6/30 Intubated and placed on vasopressor support and started on CRRT 7/1 Remains intubated on CRRT continue with pressor support 7/2 Agitated with SAT, remains on CRRT and low dose levophed 7/3 low dose NE, ongoing CRRT 7/4 Attempted SBT this am, apneic but severely agitated,  desaturated, HR up 130/140s.  Re-sedated but HR remains 140's, NE up from 6 to . NSR on EKG.  Was pulling 50-100on UF, since kept even for now.  CVP 8 7/5 DCCV again attempted this a.m. with success, remains on norepinephrine and vasopressin.  7/6 went into A-fib with RVR, attempted DCCV x 3, started on amiodarone infusion after amiodarone bolus 7/7 Multiple DCCV attempts without success. TF held due to vomiting. Tx denied to [facility]  7/8 KUB looked OK. No further vomiting. Tolerating TF. Large BM overnight.  7/9 vasopressors back up, remains rapid afib/ flutter, Bmx 1, wua causing hemodynamic instability 7/10 weaned sedation on mothers request, HR remains 140-220s, tmax 100.6, DNR, no escalation  Interim History / Subjective:  No events. Afib/RVR continued unaffected by amio drip. Unchanged high pressors needs. Net even.  Objective:  Blood pressure (!) 149/135, pulse (!) 104, temperature 99 F (37.2 C), temperature source Oral, resp. rate (!) 26, height 6' (1.829 m), weight (!) 177.4 kg, SpO2 100%. CVP:  [8 mmHg-19 mmHg] 8 mmHg  Vent Mode: PRVC FiO2 (%):  [40 %] 40 % Set Rate:  [24 bmp] 24 bmp Vt Set:  [600 mL] 600 mL PEEP:  [5 cmH20] 5 cmH20 Plateau Pressure:  [23 cmH20-24 cmH20] 23 cmH20   Intake/Output Summary (Last 24 hours) at 02/06/2023 1054 Last data filed at 02/06/2023 1000 Gross per 24 hour  Intake 3963.79 ml  Output 3879 ml  Net 84.79 ml   Filed Weights   02/04/23 0500 02/05/23 0700 02/06/23 0600  Weight: (!) 172.4 kg (!) 173.2 kg (!) 177.4 kg   Physical Examination: No distress  Sedated on vent Pupils small but equal Heart/lung sounds distant due to body habitus Ext warm Abd soft   Patient Lines/Drains/Airways Status     Active Line/Drains/Airways     Name Placement date Placement time Site Days   Arterial Line 01/22/23 Right Other (Comment) 01/22/23  1349  Other (Comment)  14   CVC Triple Lumen 01/22/23 Left Internal jugular 01/22/23  --  -- 14    Hemodialysis Catheter Right Internal jugular Double lumen Permanent (Tunneled) 08/20/21  1324  Internal jugular  534   NG/OG Vented/Dual Lumen 16 Fr. Oral Marking at nare/corner of mouth 65 cm 01/22/23  1500  Oral  14   Flatus Tube/Pouch 02/02/23  0630  --  3   Airway 8 mm 01/22/23  1245  -- 14   Pressure Injury 02/03/23 Coccyx Mid Stage 2 -  Partial thickness loss of dermis presenting as a shallow open injury with a red, pink wound bed without slough. 02/03/23  0800  -- 2   Wound / Incision (Open or Dehisced) 08/05/22 Ankle Anterior;Right;Lateral open wound wrapped with abd and kerlix. weeping 08/05/22  --  Ankle  184   Wound / Incision (Open or Dehisced) 08/05/22 Ankle Anterior;Left;Lateral open wounds wrapped with abd and kerlix, weeping 08/05/22  --  Ankle  184   Wound / Incision (Open or Dehisced) 08/06/22 Skin tear Thigh Posterior;Proximal;Right 08/06/22  --  Thigh  183   Wound / Incision (Open or Dehisced) 02/04/23 Skin tear Hip Left;Posterior small circular skin tears on left hip and back 02/04/23  1345  Hip  1           CXR pulmonary edema WBC continues to improve Coox 74 Pct 67 Sed rate 122  Resolved Hospital Problem List:  Hematuria due to traumatic Foley insertion  Assessment & Plan:  Acute-on-chronic hypoxemic respiratory failure Bilateral multifocal pneumonia- initially strep, question if superimposed HCAP Pulmonary edema due to noncompliance with hemodialysis/AonC HF; stable OHS/OSA, noncompliant with CPAP Acute on chronic HFrEF Paroxysmal rapid A-fib/flutter with RVR- see previous notes, seems impossible to treat, some question of previous amio lung toxicity (05/2022): reviewed the imaging, nodular changes were fairly minimal, I think benefit outweighs risks at this point: started 7/13 Nonischemic cardiomyopathy Ongoing shock state- sepsis being treated, WBC coming down, fever curve improving, underlying shock may be more renal vasoplegia so think we can be more  liberal w/ alpha agonism: started 7/13.  [facility] has been contacted: (A) no beds and (B) no additional treatments to offer. End-stage renal disease on hemodialysis Hypervolemic hyponatremia Hyperphosphatemia Hyperkalemia  High anion gap metabolic acidosis Hypercalcemia  - Continue amio, trend ESR and O2 needs as surrogate marker for any recurrence of lung toxicity - Midodrine, levo/vaso for MAP 65 - Fent/versed gtt as ordered, pain/anxiety drives up his AVR - Linezolid/meropenem until 02/11/23 (7 days) - CRRT for now keep even - Trend Pct, ESR - Mother updated at bedside 02/05/23 - Ongoing GOC talks, family is not ready at this time to consider transitioning to a natural death, prognosis remains grim regardless of treatment path  Best Practice (right click and "Reselect all SmartList Selections" daily)   Diet/type: tube feeds DVT prophylaxis: DOAC GI prophylaxis: Protonix Lines: Yes still needed Foley: n/a Code Status:  Full Last date of multidisciplinary goals of care: 02/04/23   31 min cc time  Myrla Halsted MD PCCM [facility] 02/06/2023, 10:54 AM  See Amion for pager If no response to pager , please call [phone] until 7pm After 7:00 pm call Elink  [phone]

## 2023-02-06 NOTE — Progress Notes (Addendum)
[facility] Progress Note  Subjective: I/O even yesterday. Spoke w/ pharmacy who said there is not a shortage of citrate dialysate (B22GK 4/0) but since it's stored by materials and not by pharmacy, we have not been doing many citrate protocols so the stock was lowered and that's why were low these last few days. Which is just short-term. They are expecting tomorrow to be restocked.   Vitals:   02/06/23 1130 02/06/23 1137 02/06/23 1145 02/06/23 1200  BP:      Pulse: (!) 104 (!) 114 (!) 116 (!) 105  Resp: (!) 30 (!) 24 (!) 25 (!) 26  Temp:  98.7 F (37.1 C)    TempSrc:  Oral    SpO2: 100% 100% 100% 100%  Weight:      Height:        Exam: General: Ill looking male, intubated, sedated Heart: A-fib with RVR, s1s2 nl Lungs: Coarse breath sound bilateral Abdomen:soft, Non-tender, non-distended Extremities: no sig LE or UE edema Dialysis Access: RIJ TDC in place.  Site clean.    OP HD: TTS [facility]  4h  425/ 1.5  3K/2.5 bath   180kg  TDC   - mircera 200 mcg q 2 wks, last 6/13 - sensipar 60mg  three times per week - hectorol 4 mcg three times per week - doesn't stay on for full sessions, doesn't adhere to fluid restriction  CXR 7/06- vasc congestion, no edema    Assessment/ Plan: Sepsis/septic shock: strep pneumoniae grew on initial BCx's (6/28) w/ presumed PNA. Was on rocephin initially then changed to meropenem and linezolid. Still requiring Levo and vaso at high doses.  ESRD: Required emergent HD on 6/29 because of fluid overload.  Then developed shock and afib/ RVR requiring high doses of pressors.  Started CRRT on 6/29. CRRT was stopped briefly 7/11 then resumed 7/12. Cont CRRT.  CRRT anticoagulation - pt required change to citrate protocol for frequent clotting started 7/06.  We cont using citrate protocol due to excessive clotting and intolerance to heparin. Spoke w/ pharmacy who said there is not a shortage of citrate dialysate (B22GK 4/0) but since it's stored  by materials and not by pharmacy, and we have not been doing citrate protocols lately, the stock was lowered by materials and that's why were low these last few days. Which is just short-term. They are expecting tomorrow to be restocked. Will resume B22GK 4/0.  Volume:  6/29- 7/3 on CRRT was net neg 19.3 L over 5 days. CXR 7/06 was w/o edema. Wts are 5-10 kg under edw. No sign of volume overload. Keeping even w/ CRRT.  Acute on chronic systolic CHF, nonischemic: EF 16-10%.  Anemia of CKD: Hemoglobin at goal.  Received Aranesp on 7/1 a nd 7/08. A-fib with RVR with hypotension: S/P cardioversion 7/5 -> NSR -> afib RVR 7/6 early AM refractory to [facility] x3 + amiodarone.      Vinson Moselle MD  [facility] 02/06/2023, 12:33 PM  Recent Labs  Lab 02/05/23 1537 02/05/23 2321 02/06/23 0447 02/06/23 0617 02/06/23 0618  HGB  --    < > 10.9* 15.0 11.6*  ALBUMIN 2.5*  --  2.5*  --   --   CALCIUM 10.2  --  10.8*  --   --   PHOS 5.6*  --  5.1*  --   --   CREATININE 4.13*   < > 3.50* 2.90* 3.60*  K 4.9   < > 4.3 4.2 4.3   < > = values in  this interval not displayed.   No results for input(s): "IRON", "TIBC", "FERRITIN" in the last 168 hours. Inpatient medications:  apixaban  5 mg Per Tube BID   Chlorhexidine Gluconate Cloth  6 each Topical Q0600   darbepoetin (ARANESP) injection - DIALYSIS  100 mcg Subcutaneous Q Mon-1800   feeding supplement (PROSource TF20)  60 mL Per Tube Q4H   fentaNYL (SUBLIMAZE) injection  50 mcg Intravenous Once   Gerhardt's butt cream   Topical Daily   hydrOXYzine  10 mg Per Tube TID   insulin aspart  0-9 Units Subcutaneous Q4H   insulin glargine-yfgn  14 Units Subcutaneous Daily   lidocaine  1 patch Transdermal Q24H   melatonin  5 mg Per Tube QHS   midodrine  10 mg Per Tube TID WC   multivitamin  1 tablet Per Tube QHS   nutrition supplement (JUVEN)  1 packet Per Tube BID BM   mouth rinse  15 mL Mouth Rinse Q2H   pantoprazole (PROTONIX) IV  40 mg Intravenous Q24H      prismasol BGK 4/2.5 1,200 mL/hr at 02/06/23 0848   sodium chloride Stopped (01/22/23 1859)   albumin human 60 mL/hr at 02/06/23 1200   amiodarone 30 mg/hr (02/06/23 1200)   anticoagulant sodium citrate     calcium gluconate 20 g in dextrose 5 % 1,000 mL infusion 60 mL/hr at 02/06/23 1200   citrate dextrose 3,000 mL (02/05/23 1618)   feeding supplement (VITAL 1.5 CAL) 50 mL/hr at 02/06/23 1200   fentaNYL infusion INTRAVENOUS 75 mcg/hr (02/06/23 1200)   linezolid (ZYVOX) IV Stopped (02/06/23 1057)   meropenem (MERREM) IV Stopped (02/06/23 0927)   midazolam 5 mg/hr (02/06/23 1200)   norepinephrine (LEVOPHED) Adult infusion 32 mcg/min (02/06/23 1200)   prismasol BGK 4/2.5 1,000 mL/hr at 02/06/23 1105   vasopressin 0.04 Units/min (02/06/23 1200)   sodium chloride, acetaminophen **OR** acetaminophen, anticoagulant sodium citrate, bisacodyl, docusate, fentaNYL, midazolam, ondansetron (ZOFRAN) IV, mouth rinse, polyethylene glycol, polyvinyl alcohol, senna-docusate, surgical lubricant

## 2023-02-07 ENCOUNTER — Inpatient Hospital Stay (HOSPITAL_COMMUNITY): Payer: BC Managed Care – PPO

## 2023-02-07 DIAGNOSIS — J9601 Acute respiratory failure with hypoxia: Secondary | ICD-10-CM | POA: Diagnosis not present

## 2023-02-07 LAB — POCT I-STAT, CHEM 8
BUN: 39 mg/dL — ABNORMAL HIGH (ref 6–20)
BUN: 43 mg/dL — ABNORMAL HIGH (ref 6–20)
BUN: 35 mg/dL — ABNORMAL HIGH (ref 6–20)
BUN: 39 mg/dL — ABNORMAL HIGH (ref 6–20)
BUN: 38 mg/dL — ABNORMAL HIGH (ref 6–20)
BUN: 42 mg/dL — ABNORMAL HIGH (ref 6–20)
BUN: 38 mg/dL — ABNORMAL HIGH (ref 6–20)
BUN: 40 mg/dL — ABNORMAL HIGH (ref 6–20)
BUN: 39 mg/dL — ABNORMAL HIGH (ref 6–20)
BUN: 43 mg/dL — ABNORMAL HIGH (ref 6–20)
Calcium, Ion: 0.37 mmol/L — CL (ref 1.15–1.40)
Calcium, Ion: 1.07 mmol/L — ABNORMAL LOW (ref 1.15–1.40)
Calcium, Ion: 0.39 mmol/L — CL (ref 1.15–1.40)
Calcium, Ion: 1.04 mmol/L — ABNORMAL LOW (ref 1.15–1.40)
Calcium, Ion: 0.41 mmol/L — CL (ref 1.15–1.40)
Calcium, Ion: 1.19 mmol/L (ref 1.15–1.40)
Calcium, Ion: 0.47 mmol/L — CL (ref 1.15–1.40)
Calcium, Ion: 1.1 mmol/L — ABNORMAL LOW (ref 1.15–1.40)
Calcium, Ion: 0.61 mmol/L — CL (ref 1.15–1.40)
Calcium, Ion: 1.09 mmol/L — ABNORMAL LOW (ref 1.15–1.40)
Chloride: 93 mmol/L — ABNORMAL LOW (ref 98–111)
Chloride: 96 mmol/L — ABNORMAL LOW (ref 98–111)
Chloride: 93 mmol/L — ABNORMAL LOW (ref 98–111)
Chloride: 95 mmol/L — ABNORMAL LOW (ref 98–111)
Chloride: 93 mmol/L — ABNORMAL LOW (ref 98–111)
Chloride: 95 mmol/L — ABNORMAL LOW (ref 98–111)
Chloride: 92 mmol/L — ABNORMAL LOW (ref 98–111)
Chloride: 97 mmol/L — ABNORMAL LOW (ref 98–111)
Chloride: 93 mmol/L — ABNORMAL LOW (ref 98–111)
Chloride: 96 mmol/L — ABNORMAL LOW (ref 98–111)
Creatinine, Ser: 2.4 mg/dL — ABNORMAL HIGH (ref 0.61–1.24)
Creatinine, Ser: 3.1 mg/dL — ABNORMAL HIGH (ref 0.61–1.24)
Creatinine, Ser: 2.4 mg/dL — ABNORMAL HIGH (ref 0.61–1.24)
Creatinine, Ser: 3 mg/dL — ABNORMAL HIGH (ref 0.61–1.24)
Creatinine, Ser: 2.5 mg/dL — ABNORMAL HIGH (ref 0.61–1.24)
Creatinine, Ser: 3.2 mg/dL — ABNORMAL HIGH (ref 0.61–1.24)
Creatinine, Ser: 2.5 mg/dL — ABNORMAL HIGH (ref 0.61–1.24)
Creatinine, Ser: 3.1 mg/dL — ABNORMAL HIGH (ref 0.61–1.24)
Creatinine, Ser: 2.7 mg/dL — ABNORMAL HIGH (ref 0.61–1.24)
Creatinine, Ser: 3 mg/dL — ABNORMAL HIGH (ref 0.61–1.24)
Glucose, Bld: 122 mg/dL — ABNORMAL HIGH (ref 70–99)
Glucose, Bld: 169 mg/dL — ABNORMAL HIGH (ref 70–99)
Glucose, Bld: 144 mg/dL — ABNORMAL HIGH (ref 70–99)
Glucose, Bld: 189 mg/dL — ABNORMAL HIGH (ref 70–99)
Glucose, Bld: 146 mg/dL — ABNORMAL HIGH (ref 70–99)
Glucose, Bld: 188 mg/dL — ABNORMAL HIGH (ref 70–99)
Glucose, Bld: 165 mg/dL — ABNORMAL HIGH (ref 70–99)
Glucose, Bld: 197 mg/dL — ABNORMAL HIGH (ref 70–99)
Glucose, Bld: 190 mg/dL — ABNORMAL HIGH (ref 70–99)
Glucose, Bld: 219 mg/dL — ABNORMAL HIGH (ref 70–99)
HCT: 32 % — ABNORMAL LOW (ref 39.0–52.0)
HCT: 39 % (ref 39.0–52.0)
HCT: 33 % — ABNORMAL LOW (ref 39.0–52.0)
HCT: 40 % (ref 39.0–52.0)
HCT: 34 % — ABNORMAL LOW (ref 39.0–52.0)
HCT: 39 % (ref 39.0–52.0)
HCT: 34 % — ABNORMAL LOW (ref 39.0–52.0)
HCT: 39 % (ref 39.0–52.0)
HCT: 32 % — ABNORMAL LOW (ref 39.0–52.0)
HCT: 40 % (ref 39.0–52.0)
Hemoglobin: 10.9 g/dL — ABNORMAL LOW (ref 13.0–17.0)
Hemoglobin: 13.3 g/dL (ref 13.0–17.0)
Hemoglobin: 11.2 g/dL — ABNORMAL LOW (ref 13.0–17.0)
Hemoglobin: 13.6 g/dL (ref 13.0–17.0)
Hemoglobin: 11.6 g/dL — ABNORMAL LOW (ref 13.0–17.0)
Hemoglobin: 13.3 g/dL (ref 13.0–17.0)
Hemoglobin: 11.6 g/dL — ABNORMAL LOW (ref 13.0–17.0)
Hemoglobin: 13.3 g/dL (ref 13.0–17.0)
Hemoglobin: 10.9 g/dL — ABNORMAL LOW (ref 13.0–17.0)
Hemoglobin: 13.6 g/dL (ref 13.0–17.0)
Potassium: 4.1 mmol/L (ref 3.5–5.1)
Potassium: 4.2 mmol/L (ref 3.5–5.1)
Potassium: 3.7 mmol/L (ref 3.5–5.1)
Potassium: 3.8 mmol/L (ref 3.5–5.1)
Potassium: 3.7 mmol/L (ref 3.5–5.1)
Potassium: 3.8 mmol/L (ref 3.5–5.1)
Potassium: 3.7 mmol/L (ref 3.5–5.1)
Potassium: 3.7 mmol/L (ref 3.5–5.1)
Potassium: 3.7 mmol/L (ref 3.5–5.1)
Potassium: 3.8 mmol/L (ref 3.5–5.1)
Sodium: 134 mmol/L — ABNORMAL LOW (ref 135–145)
Sodium: 137 mmol/L (ref 135–145)
Sodium: 134 mmol/L — ABNORMAL LOW (ref 135–145)
Sodium: 137 mmol/L (ref 135–145)
Sodium: 135 mmol/L (ref 135–145)
Sodium: 137 mmol/L (ref 135–145)
Sodium: 135 mmol/L (ref 135–145)
Sodium: 136 mmol/L (ref 135–145)
Sodium: 135 mmol/L (ref 135–145)
Sodium: 135 mmol/L (ref 135–145)
TCO2: 29 mmol/L (ref 22–32)
TCO2: 29 mmol/L (ref 22–32)
TCO2: 27 mmol/L (ref 22–32)
TCO2: 27 mmol/L (ref 22–32)
TCO2: 27 mmol/L (ref 22–32)
TCO2: 28 mmol/L (ref 22–32)
TCO2: 25 mmol/L (ref 22–32)
TCO2: 26 mmol/L (ref 22–32)
TCO2: 27 mmol/L (ref 22–32)
TCO2: 28 mmol/L (ref 22–32)

## 2023-02-07 LAB — POCT ACTIVATED CLOTTING TIME
Activated Clotting Time: 159 seconds
Activated Clotting Time: 159 seconds

## 2023-02-07 LAB — COOXEMETRY PANEL
Carboxyhemoglobin: 1.6 % — ABNORMAL HIGH (ref 0.5–1.5)
Methemoglobin: <0.7 % (ref 0.0–1.5)
O2 Saturation: 71.4 %
Total hemoglobin: 10.3 g/dL — ABNORMAL LOW (ref 12.0–16.0)

## 2023-02-07 LAB — SEDIMENTATION RATE: Sed Rate: 102 mm/hr — ABNORMAL HIGH (ref 0–16)

## 2023-02-07 LAB — RENAL FUNCTION PANEL
Albumin: 3.1 g/dL — ABNORMAL LOW (ref 3.5–5.0)
Albumin: 3 g/dL — ABNORMAL LOW (ref 3.5–5.0)
Anion gap: 17 — ABNORMAL HIGH (ref 5–15)
Anion gap: 19 — ABNORMAL HIGH (ref 5–15)
BUN: 48 mg/dL — ABNORMAL HIGH (ref 6–20)
BUN: 42 mg/dL — ABNORMAL HIGH (ref 6–20)
CO2: 28 mmol/L (ref 22–32)
CO2: 24 mmol/L (ref 22–32)
Calcium: 10.9 mg/dL — ABNORMAL HIGH (ref 8.9–10.3)
Calcium: 10.9 mg/dL — ABNORMAL HIGH (ref 8.9–10.3)
Chloride: 92 mmol/L — ABNORMAL LOW (ref 98–111)
Chloride: 92 mmol/L — ABNORMAL LOW (ref 98–111)
Creatinine, Ser: 2.95 mg/dL — ABNORMAL HIGH (ref 0.61–1.24)
Creatinine, Ser: 2.87 mg/dL — ABNORMAL HIGH (ref 0.61–1.24)
GFR, Estimated: 27 mL/min — ABNORMAL LOW (ref >60)
GFR, Estimated: 28 mL/min — ABNORMAL LOW (ref >60)
Glucose, Bld: 125 mg/dL — ABNORMAL HIGH (ref 70–99)
Glucose, Bld: 146 mg/dL — ABNORMAL HIGH (ref 70–99)
Phosphorus: 5 mg/dL — ABNORMAL HIGH (ref 2.5–4.6)
Phosphorus: 4.5 mg/dL (ref 2.5–4.6)
Potassium: 4.2 mmol/L (ref 3.5–5.1)
Potassium: 3.8 mmol/L (ref 3.5–5.1)
Sodium: 137 mmol/L (ref 135–145)
Sodium: 135 mmol/L (ref 135–145)

## 2023-02-07 LAB — CBC
HCT: 33 % — ABNORMAL LOW (ref 39.0–52.0)
Hemoglobin: 10.3 g/dL — ABNORMAL LOW (ref 13.0–17.0)
MCH: 32.9 pg (ref 26.0–34.0)
MCHC: 31.2 g/dL (ref 30.0–36.0)
MCV: 105.4 fL — ABNORMAL HIGH (ref 80.0–100.0)
Platelets: 275 10*3/uL (ref 150–400)
RBC: 3.13 MIL/uL — ABNORMAL LOW (ref 4.22–5.81)
RDW: 18.8 % — ABNORMAL HIGH (ref 11.5–15.5)
WBC: 13.2 10*3/uL — ABNORMAL HIGH (ref 4.0–10.5)
nRBC: 0.2 % (ref 0.0–0.2)

## 2023-02-07 LAB — GLUCOSE, CAPILLARY
Glucose-Capillary: 105 mg/dL — ABNORMAL HIGH (ref 70–99)
Glucose-Capillary: 123 mg/dL — ABNORMAL HIGH (ref 70–99)
Glucose-Capillary: 140 mg/dL — ABNORMAL HIGH (ref 70–99)
Glucose-Capillary: 148 mg/dL — ABNORMAL HIGH (ref 70–99)
Glucose-Capillary: 155 mg/dL — ABNORMAL HIGH (ref 70–99)
Glucose-Capillary: 167 mg/dL — ABNORMAL HIGH (ref 70–99)

## 2023-02-07 LAB — PROCALCITONIN: Procalcitonin: 44.9 ng/mL

## 2023-02-07 LAB — MAGNESIUM: Magnesium: 2.2 mg/dL (ref 1.7–2.4)

## 2023-02-07 MED ORDER — METOCLOPRAMIDE HCL 5 MG/ML IJ SOLN
5.0000 mg | Freq: Three times a day (TID) | INTRAMUSCULAR | Status: DC
  Administered 2023-02-07 – 2023-02-09 (×7): 5 mg via INTRAVENOUS
  Filled 2023-02-07 (×6): qty 2

## 2023-02-07 NOTE — Progress Notes (Signed)
[facility] Progress Note    Assessment/ Plan:   Sepsis/septic shock: strep pneumoniae grew on initial BCx's (6/28) w/ presumed PNA. Was on rocephin initially then changed to meropenem and linezolid. Still requiring Levo and vaso at high doses.  Also on midodrine AHRF: vent support per PCCM ESRD: Required emergent HD on 6/29 because of fluid overload.  Then developed shock and afib/ RVR requiring high doses of pressors.  Started CRRT on 6/29. CRRT was stopped briefly 7/11 then resumed 7/12. Cont CRRT.  CRRT anticoagulation - pt required change to citrate protocol for frequent clotting started 7/06.  We cont using citrate protocol due to excessive clotting and intolerance to heparin. There has been a shortage of the dialysate (low bicarb/ zero Ca) designed for citrate protocol-can resume once bags available.  Cont to monitor iCal Volume:  Keeping even w/ CRRT for now Acute on chronic systolic CHF, nonischemic: EF 25-95%.  Anemia of CKD: Hemoglobin at goal.  Received Aranesp on 7/1 a nd 7/08. Hgb 13.3 Hypercalcemia: due to citrate protocol. Ca++ peaked at `12 and improved now down to 9-10 range. Cont to follow.  A-fib with RVR with hypotension: S/P cardioversion 7/5 -> NSR -> afib RVR 7/6 early AM refractory to [facility] x3 + amiodarone.   Grim prognosis, GOC discussions with family per primary service  Subjective:   Patient seen and examined on CRRT. No acute events. Remains on levo, vaso, amio gtt's. Continues to be in afib w/ RVR   Objective:   BP (!) 149/135 (BP Location: Left Wrist)   Pulse (!) 108   Temp 100.2 F (37.9 C) (Axillary)   Resp (!) 26   Ht 6' (1.829 m)   Wt (!) 177.3 kg   SpO2 100%   BMI 53.01 kg/m   Intake/Output Summary (Last 24 hours) at 02/07/2023 0813 Last data filed at 02/07/2023 0700 Gross per 24 hour  Intake 4368.48 ml  Output 4500 ml  Net -131.52 ml   Weight change: -0.1 kg  Physical Exam: Gen: ill appearing, intubated/sedated CVS: irreg  irreg Resp: coarse breath sounds bilaterally, intubated Abd: obese, soft, nt/nd Ext: no sig edema B/L Les Neuro: sedated Dialysis access: RIJ TDC in use  Imaging: DG Chest Port 1 View  Result Date: 02/06/2023 CLINICAL DATA:  ARDS. EXAM: PORTABLE CHEST 1 VIEW COMPARISON:  Chest x-ray dated February 01, 2023. FINDINGS: Unchanged endotracheal and enteric tubes. Unchanged left internal jugular central venous catheter and right internal jugular dialysis catheter. Stable moderate cardiomegaly. Unchanged low lung volumes with crowding of the pulmonary vasculature. No consolidation, pneumothorax, or large pleural effusion. No acute osseous abnormality. IMPRESSION: 1. Stable lines and tubes with hypoventilatory exam. Electronically Signed   By: Obie Dredge M.D.   On: 02/06/2023 09:00    Labs: BMET Recent Labs  Lab 02/04/23 0412 02/04/23 1636 02/04/23 2006 02/05/23 0401 02/05/23 0729 02/05/23 1537 02/05/23 2321 02/06/23 0447 02/06/23 0617 02/06/23 1431 02/06/23 1729 02/06/23 2115 02/06/23 2119 02/07/23 0418 02/07/23 0541 02/07/23 0544  NA 133* 133*   < > 133*   < > 133*   < > 135   < > 139 134* 134* 134* 137 134* 137  K 5.1 6.4*   < > 5.5*   < > 4.9   < > 4.3   < > 5.2* 4.1 5.8* 4.1 4.2 4.2 4.1  CL 96* 96*   < > 93*   < > 93*   < > 88*   < > 84* 90* 94* 96*  92* 96* 93*  CO2 20* 17*  --  21*  --  20*  --  24  --   --  24  --   --  28  --   --   GLUCOSE 158* 151*   < > 239*   < > 179*   < > 148*   < > 176* 134* 188* 152* 125* 122* 169*  BUN 76* 111*   < > 98*   < > 79*   < > 68*   < > 76* 57* 73* 54* 48* 43* 39*  CREATININE 5.03* 6.77*   < > 5.61*   < > 4.13*   < > 3.50*   < > 2.70* 3.09* 2.50* 3.10* 2.95* 3.10* 2.40*  CALCIUM 10.3 9.7  --  9.9  --  10.2  --  10.8*  --   --  10.7*  --   --  10.9*  --   --   PHOS 6.1* 6.9*  --  6.1*  --  5.6*  --  5.1*  --   --  4.7*  --   --  5.0*  --   --    < > = values in this interval not displayed.   CBC Recent Labs  Lab 02/04/23 0412  02/04/23 2006 02/05/23 0401 02/05/23 0729 02/06/23 0447 02/06/23 0617 02/06/23 2119 02/07/23 0418 02/07/23 0541 02/07/23 0544  WBC 32.1*  --  25.3*  --  18.1*  --   --  13.2*  --   --   NEUTROABS  --   --  22.8*  --   --   --   --   --   --   --   HGB 12.7*   < > 11.7*   < > 10.9*   < > 12.2* 10.3* 10.9* 13.3  HCT 39.2   < > 37.0*   < > 35.2*   < > 36.0* 33.0* 32.0* 39.0  MCV 102.1*  --  103.9*  --  104.1*  --   --  105.4*  --   --   PLT 159  --  219  --  267  --   --  275  --   --    < > = values in this interval not displayed.    Medications:     apixaban  5 mg Per Tube BID   Chlorhexidine Gluconate Cloth  6 each Topical Q0600   darbepoetin (ARANESP) injection - DIALYSIS  100 mcg Subcutaneous Q Mon-1800   feeding supplement (PROSource TF20)  60 mL Per Tube Q4H   fentaNYL (SUBLIMAZE) injection  50 mcg Intravenous Once   Gerhardt's butt cream   Topical Daily   hydrOXYzine  10 mg Per Tube TID   insulin aspart  0-9 Units Subcutaneous Q4H   insulin glargine-yfgn  14 Units Subcutaneous Daily   lidocaine  1 patch Transdermal Q24H   melatonin  5 mg Per Tube QHS   metoCLOPramide (REGLAN) injection  5 mg Intravenous Q8H   midodrine  10 mg Per Tube TID WC   multivitamin  1 tablet Per Tube QHS   nutrition supplement (JUVEN)  1 packet Per Tube BID BM   mouth rinse  15 mL Mouth Rinse Q2H   pantoprazole (PROTONIX) IV  40 mg Intravenous Q24H      Anthony Sar, MD [facility] 02/07/2023, 8:13 AM

## 2023-02-07 NOTE — Plan of Care (Signed)
   Problem: Education: Goal: Knowledge of General Education information will improve Description: Including pain rating scale, medication(s)/side effects and non-pharmacologic comfort measures Outcome: Not Progressing   Problem: Health Behavior/Discharge Planning: Goal: Ability to manage health-related needs will improve Outcome: Not Progressing   Problem: Clinical Measurements: Goal: Ability to maintain clinical measurements within normal limits will improve Outcome: Not Progressing Goal: Will remain free from infection Outcome: Not Progressing Goal: Diagnostic test results will improve Outcome: Not Progressing Goal: Respiratory complications will improve Outcome: Not Progressing Goal: Cardiovascular complication will be avoided Outcome: Not Progressing   Problem: Activity: Goal: Risk for activity intolerance will decrease Outcome: Not Progressing   Problem: Nutrition: Goal: Adequate nutrition will be maintained Outcome: Not Progressing   Problem: Coping: Goal: Level of anxiety will decrease Outcome: Not Progressing   Problem: Elimination: Goal: Will not experience complications related to bowel motility Outcome: Not Progressing Goal: Will not experience complications related to urinary retention Outcome: Not Progressing   Problem: Pain Managment: Goal: General experience of comfort will improve Outcome: Not Progressing   Problem: Safety: Goal: Ability to remain free from injury will improve Outcome: Not Progressing   Problem: Skin Integrity: Goal: Risk for impaired skin integrity will decrease Outcome: Not Progressing   Problem: Education: Goal: Ability to describe self-care measures that may prevent or decrease complications (Diabetes Survival Skills Education) will improve Outcome: Not Progressing Goal: Individualized Educational Video(s) Outcome: Not Progressing   Problem: Coping: Goal: Ability to adjust to condition or change in health will  improve Outcome: Not Progressing   Problem: Fluid Volume: Goal: Ability to maintain a balanced intake and output will improve Outcome: Not Progressing   Problem: Health Behavior/Discharge Planning: Goal: Ability to identify and utilize available resources and services will improve Outcome: Not Progressing Goal: Ability to manage health-related needs will improve Outcome: Not Progressing   Problem: Metabolic: Goal: Ability to maintain appropriate glucose levels will improve Outcome: Not Progressing   Problem: Nutritional: Goal: Maintenance of adequate nutrition will improve Outcome: Not Progressing Goal: Progress toward achieving an optimal weight will improve Outcome: Not Progressing   Problem: Skin Integrity: Goal: Risk for impaired skin integrity will decrease Outcome: Not Progressing   Problem: Tissue Perfusion: Goal: Adequacy of tissue perfusion will improve Outcome: Not Progressing   

## 2023-02-07 NOTE — Progress Notes (Signed)
eLink Physician-Brief Progress Note Patient Name: Hernandez Losasso DOB: 16-Jul-1986 MRN: [id number]   Date of Service  02/07/2023  HPI/Events of Note  Poor output from flexiseal per nurse.  Tube feeds held yesterday for high OGT output then restarted.  eICU Interventions  Abd film ordered     Intervention Category Intermediate Interventions: Other:  Henry Russel, Demetrius Charity 02/07/2023, 9:59 PM

## 2023-02-07 NOTE — Progress Notes (Signed)
NAME:  Phillip Young, MRN:  [id number], DOB:  September 08, 1985, LOS: 17 ADMISSION DATE:  01/21/2023, CONSULTATION DATE:  01/22/23 REFERRING MD:  Danise Edge, CHIEF COMPLAINT:  SOB   History of Present Illness:  37 year old man well known to the PCCM and AHF services who presented to [facility] 6/28 with fevers, joint pains, SOB. PMHx significant for ESRD, NICM, refractory AFib, ?amio-induced lung toxicity. Patient reportedly missed HD on Thursday (has a history of leaving HD early). Reportedly ~10kg over dry weight with worsening breathing status, AFib/RVR and borderline BP.  PCCM consulted for ICU admission/further evaluation and management.  Pertinent Medical History:   Past Medical History:  Diagnosis Date   Acute on chronic respiratory failure with hypoxia ([name]) 04/21/2021   Acute on chronic systolic (congestive) heart failure ([name]) 02/26/2020   Amiodarone toxicity    Anemia    Atrial flutter ([name])    Biventricular congestive heart failure ([name])    Chronic hypoxemic respiratory failure ([name])    Class 3 severe obesity due to excess calories with serious comorbidity and body mass index (BMI) of 50.0 to 59.9 in adult [facility]) 02/26/2020   Essential hypertension 02/26/2020   GERD without esophagitis 02/26/2020   Hidradenitis suppurativa 02/26/2020   NICM (nonischemic cardiomyopathy) ([name])    Obesity hypoventilation syndrome ([name])    OSA (obstructive sleep apnea)    PAF (paroxysmal atrial fibrillation) ([name])    Pneumonia    Prediabetes 02/26/2020   Significant Hospital Events: Including procedures, antibiotic start and stop dates in addition to other pertinent events   6/28 Admit, emergent HD 6/29 Afib/RVR, shock, ICU transfer 6/30 Intubated and placed on vasopressor support and started on CRRT 7/1 Remains intubated on CRRT continue with pressor support 7/2 Agitated with SAT, remains on CRRT and low dose levophed 7/3 low dose NE, ongoing CRRT 7/4 Attempted SBT this am, apneic but severely agitated,  desaturated, HR up 130/140s.  Re-sedated but HR remains 140's, NE up from 6 to . NSR on EKG.  Was pulling 50-100on UF, since kept even for now.  CVP 8 7/5 DCCV again attempted this a.m. with success, remains on norepinephrine and vasopressin.  7/6 went into A-fib with RVR, attempted DCCV x 3, started on amiodarone infusion after amiodarone bolus 7/7 Multiple DCCV attempts without success. TF held due to vomiting. Tx denied to [facility]  7/8 KUB looked OK. No further vomiting. Tolerating TF. Large BM overnight.  7/9 vasopressors back up, remains rapid afib/ flutter, Bmx 1, wua causing hemodynamic instability 7/10 weaned sedation on mothers request, HR remains 140-220s, tmax 100.6, DNR, no escalation  Interim History / Subjective:  No events. Remains in RVR. Remains sedated. Pressor needs improved slightly.  Objective:  Blood pressure (!) 149/135, pulse (!) 109, temperature 98.9 F (37.2 C), temperature source Oral, resp. rate (!) 29, height 6' (1.829 m), weight (!) 177.3 kg, SpO2 98%. CVP:  [6 mmHg-21 mmHg] 8 mmHg  Vent Mode: PRVC FiO2 (%):  [40 %] 40 % Set Rate:  [24 bmp] 24 bmp Vt Set:  [60 mL-600 mL] 60 mL PEEP:  [5 cmH20] 5 cmH20 Plateau Pressure:  [21 cmH20-24 cmH20] 22 cmH20   Intake/Output Summary (Last 24 hours) at 02/07/2023 1017 Last data filed at 02/07/2023 1000 Gross per 24 hour  Intake 7698.01 ml  Output 4980.4 ml  Net 2717.61 ml   Filed Weights   02/05/23 0700 02/06/23 0600 02/07/23 0600  Weight: (!) 173.2 kg (!) 177.4 kg (!) 177.3 kg   Physical Examination: Sedated on vent Pupils  equal ETT small thick secretions Ext warm Difficult to tell what is fluid vs. Adipose tissue Heart/lung sounds distant  CBG ok BMP ok, Ca adjusted w/ CRRT WBC continues to improve Pct/ESR improving  Resolved Hospital Problem List:  Hematuria due to traumatic Foley insertion  Assessment & Plan:  Acute-on-chronic hypoxemic respiratory failure Bilateral multifocal pneumonia-  initially strep, question if superimposed HCAP; improvement in WBC with broad spectrum abx argues for the HCAP Pulmonary edema due to noncompliance with hemodialysis/AonC HF; stable OHS/OSA, noncompliant with CPAP Acute on chronic HFrEF Paroxysmal rapid A-fib/flutter with RVR- see previous notes, refractory, some question of previous amio lung toxicity (05/2022): reviewed the imaging, nodular changes were fairly minimal, I think benefit outweighs risks at this point: started 7/13 Nonischemic cardiomyopathy Ongoing shock state- sepsis being treated, WBC coming down, fever curve improving, underlying shock may be more renal vasoplegia so think we can be more liberal w/ alpha agonism: started 7/13.  [facility] has been contacted: (A) no beds and (B) no additional treatments to offer. End-stage renal disease on hemodialysis Hypervolemic hyponatremia Hyperphosphatemia Hyperkalemia  High anion gap metabolic acidosis Hypercalcemia  - Continue amio, trend ESR and O2 needs as surrogate marker for any recurrence of lung toxicity - Midodrine, levo/vaso for MAP 65 - Fent/versed gtt as ordered, pain/anxiety drives up his AVR - Linezolid/meropenem until 02/11/23 (7 days) - CRRT for now keep even - Trend Pct, ESR; both heading in right direction - Mother updated at bedside 02/05/23 - Ongoing GOC talks, family is not ready at this time to consider transitioning to a natural death, prognosis remains grim regardless of treatment path  Best Practice (right click and "Reselect all SmartList Selections" daily)   Diet/type: tube feeds DVT prophylaxis: DOAC GI prophylaxis: Protonix Lines: Yes still needed Foley: n/a Code Status:  Full Last date of multidisciplinary goals of care: 02/04/23   32 min cc time  Myrla Halsted MD PCCM [facility] 02/07/2023, 10:17 AM  See Amion for pager If no response to pager , please call [phone] until 7pm After 7:00 pm call Elink  [phone]

## 2023-02-07 NOTE — Progress Notes (Signed)
Pharmacy Antibiotic Note  Phillip Young is a 37 y.o. male admitted on 01/21/2023 with pneumonia.  Pharmacy has been consulted for meropenem dosing (he is also on Zyvox).  -plans are to continue antibiotics until 7/18  Plan: Meropenem 1g IV every 8 hours; end date 7/18 Stop Zyvox 7/19 Will sign off. Please contact pharmacy with any other needs   Height: 6' (182.9 cm) Weight: (!) 177.3 kg (390 lb 14 oz) IBW/kg (Calculated) : 77.6  Temp (24hrs), Avg:99.4 F (37.4 C), Min:98.9 F (37.2 C), Max:100.2 F (37.9 C)  Recent Labs  Lab 02/03/23 0412 02/03/23 1001 02/04/23 0412 02/04/23 1636 02/05/23 0401 02/05/23 0729 02/06/23 0447 02/06/23 0617 02/06/23 2115 02/06/23 2119 02/07/23 0418 02/07/23 0541 02/07/23 0544  WBC 28.3*  --  32.1*  --  25.3*  --  18.1*  --   --   --  13.2*  --   --   CREATININE  --    < > 5.03*   < > 5.61*   < > 3.50*   < > 2.50* 3.10* 2.95* 3.10* 2.40*   < > = values in this interval not displayed.    Estimated Creatinine Clearance: 70 mL/min (A) (by C-G formula based on SCr of 2.4 mg/dL (H)).    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, SRA positive   Metoprolol Other (See Comments)    near syncope   Amoxicillin Other (See Comments)    Was hospitalized    Other Swelling and Other (See Comments)    Steroids Fluid seeping out of legs     Antimicrobials this admission: 7/7 Meropenem >>  7/7 Linezolid >> 6/29 CTX >> 7/7 6/28 Vancomycin >> 7/1 6/28 Cefepime >> 6/29  Dose adjustments this admission: N/A  Microbiology results: 7/7 Bcx: neg 7/1 BCx x 2: neg  6/29 Ucx: ng  6/29 MRSA PCR: neg  6/28 BCx: S pneumo   Thank you for allowing pharmacy to participate in this patient's care,  Harland German, PharmD Clinical  Pharmacist **Pharmacist phone directory can now be found on [email or web address] ([email or web address]).  Listed under [facility].

## 2023-02-08 ENCOUNTER — Inpatient Hospital Stay (HOSPITAL_COMMUNITY): Payer: BC Managed Care – PPO

## 2023-02-08 DIAGNOSIS — J9601 Acute respiratory failure with hypoxia: Secondary | ICD-10-CM | POA: Diagnosis not present

## 2023-02-08 LAB — POCT I-STAT, CHEM 8
BUN: 39 mg/dL — ABNORMAL HIGH (ref 6–20)
BUN: 39 mg/dL — ABNORMAL HIGH (ref 6–20)
BUN: 39 mg/dL — ABNORMAL HIGH (ref 6–20)
BUN: 39 mg/dL — ABNORMAL HIGH (ref 6–20)
BUN: 39 mg/dL — ABNORMAL HIGH (ref 6–20)
BUN: 40 mg/dL — ABNORMAL HIGH (ref 6–20)
BUN: 40 mg/dL — ABNORMAL HIGH (ref 6–20)
BUN: 40 mg/dL — ABNORMAL HIGH (ref 6–20)
BUN: 41 mg/dL — ABNORMAL HIGH (ref 6–20)
BUN: 41 mg/dL — ABNORMAL HIGH (ref 6–20)
BUN: 42 mg/dL — ABNORMAL HIGH (ref 6–20)
BUN: 42 mg/dL — ABNORMAL HIGH (ref 6–20)
BUN: 42 mg/dL — ABNORMAL HIGH (ref 6–20)
BUN: 42 mg/dL — ABNORMAL HIGH (ref 6–20)
BUN: 42 mg/dL — ABNORMAL HIGH (ref 6–20)
BUN: 43 mg/dL — ABNORMAL HIGH (ref 6–20)
BUN: 44 mg/dL — ABNORMAL HIGH (ref 6–20)
BUN: 44 mg/dL — ABNORMAL HIGH (ref 6–20)
BUN: 45 mg/dL — ABNORMAL HIGH (ref 6–20)
BUN: 47 mg/dL — ABNORMAL HIGH (ref 6–20)
Calcium, Ion: 0.31 mmol/L — CL (ref 1.15–1.40)
Calcium, Ion: 0.32 mmol/L — CL (ref 1.15–1.40)
Calcium, Ion: 0.39 mmol/L — CL (ref 1.15–1.40)
Calcium, Ion: 0.39 mmol/L — CL (ref 1.15–1.40)
Calcium, Ion: 0.4 mmol/L — CL (ref 1.15–1.40)
Calcium, Ion: 0.4 mmol/L — CL (ref 1.15–1.40)
Calcium, Ion: 0.41 mmol/L — CL (ref 1.15–1.40)
Calcium, Ion: 0.43 mmol/L — CL (ref 1.15–1.40)
Calcium, Ion: 0.44 mmol/L — CL (ref 1.15–1.40)
Calcium, Ion: 0.53 mmol/L — CL (ref 1.15–1.40)
Calcium, Ion: 1.06 mmol/L — ABNORMAL LOW (ref 1.15–1.40)
Calcium, Ion: 1.07 mmol/L — ABNORMAL LOW (ref 1.15–1.40)
Calcium, Ion: 1.08 mmol/L — ABNORMAL LOW (ref 1.15–1.40)
Calcium, Ion: 1.08 mmol/L — ABNORMAL LOW (ref 1.15–1.40)
Calcium, Ion: 1.08 mmol/L — ABNORMAL LOW (ref 1.15–1.40)
Calcium, Ion: 1.09 mmol/L — ABNORMAL LOW (ref 1.15–1.40)
Calcium, Ion: 1.09 mmol/L — ABNORMAL LOW (ref 1.15–1.40)
Calcium, Ion: 1.1 mmol/L — ABNORMAL LOW (ref 1.15–1.40)
Calcium, Ion: 1.1 mmol/L — ABNORMAL LOW (ref 1.15–1.40)
Calcium, Ion: 1.1 mmol/L — ABNORMAL LOW (ref 1.15–1.40)
Chloride: 92 mmol/L — ABNORMAL LOW (ref 98–111)
Chloride: 92 mmol/L — ABNORMAL LOW (ref 98–111)
Chloride: 92 mmol/L — ABNORMAL LOW (ref 98–111)
Chloride: 93 mmol/L — ABNORMAL LOW (ref 98–111)
Chloride: 93 mmol/L — ABNORMAL LOW (ref 98–111)
Chloride: 93 mmol/L — ABNORMAL LOW (ref 98–111)
Chloride: 93 mmol/L — ABNORMAL LOW (ref 98–111)
Chloride: 93 mmol/L — ABNORMAL LOW (ref 98–111)
Chloride: 93 mmol/L — ABNORMAL LOW (ref 98–111)
Chloride: 93 mmol/L — ABNORMAL LOW (ref 98–111)
Chloride: 94 mmol/L — ABNORMAL LOW (ref 98–111)
Chloride: 94 mmol/L — ABNORMAL LOW (ref 98–111)
Chloride: 95 mmol/L — ABNORMAL LOW (ref 98–111)
Chloride: 95 mmol/L — ABNORMAL LOW (ref 98–111)
Chloride: 95 mmol/L — ABNORMAL LOW (ref 98–111)
Chloride: 96 mmol/L — ABNORMAL LOW (ref 98–111)
Chloride: 96 mmol/L — ABNORMAL LOW (ref 98–111)
Chloride: 97 mmol/L — ABNORMAL LOW (ref 98–111)
Chloride: 97 mmol/L — ABNORMAL LOW (ref 98–111)
Chloride: 98 mmol/L (ref 98–111)
Creatinine, Ser: 2.1 mg/dL — ABNORMAL HIGH (ref 0.61–1.24)
Creatinine, Ser: 2.2 mg/dL — ABNORMAL HIGH (ref 0.61–1.24)
Creatinine, Ser: 2.3 mg/dL — ABNORMAL HIGH (ref 0.61–1.24)
Creatinine, Ser: 2.3 mg/dL — ABNORMAL HIGH (ref 0.61–1.24)
Creatinine, Ser: 2.4 mg/dL — ABNORMAL HIGH (ref 0.61–1.24)
Creatinine, Ser: 2.4 mg/dL — ABNORMAL HIGH (ref 0.61–1.24)
Creatinine, Ser: 2.4 mg/dL — ABNORMAL HIGH (ref 0.61–1.24)
Creatinine, Ser: 2.4 mg/dL — ABNORMAL HIGH (ref 0.61–1.24)
Creatinine, Ser: 2.4 mg/dL — ABNORMAL HIGH (ref 0.61–1.24)
Creatinine, Ser: 2.5 mg/dL — ABNORMAL HIGH (ref 0.61–1.24)
Creatinine, Ser: 2.7 mg/dL — ABNORMAL HIGH (ref 0.61–1.24)
Creatinine, Ser: 2.8 mg/dL — ABNORMAL HIGH (ref 0.61–1.24)
Creatinine, Ser: 2.9 mg/dL — ABNORMAL HIGH (ref 0.61–1.24)
Creatinine, Ser: 2.9 mg/dL — ABNORMAL HIGH (ref 0.61–1.24)
Creatinine, Ser: 3 mg/dL — ABNORMAL HIGH (ref 0.61–1.24)
Creatinine, Ser: 3 mg/dL — ABNORMAL HIGH (ref 0.61–1.24)
Creatinine, Ser: 3 mg/dL — ABNORMAL HIGH (ref 0.61–1.24)
Creatinine, Ser: 3 mg/dL — ABNORMAL HIGH (ref 0.61–1.24)
Creatinine, Ser: 3.1 mg/dL — ABNORMAL HIGH (ref 0.61–1.24)
Creatinine, Ser: 3.1 mg/dL — ABNORMAL HIGH (ref 0.61–1.24)
Glucose, Bld: 151 mg/dL — ABNORMAL HIGH (ref 70–99)
Glucose, Bld: 151 mg/dL — ABNORMAL HIGH (ref 70–99)
Glucose, Bld: 152 mg/dL — ABNORMAL HIGH (ref 70–99)
Glucose, Bld: 156 mg/dL — ABNORMAL HIGH (ref 70–99)
Glucose, Bld: 169 mg/dL — ABNORMAL HIGH (ref 70–99)
Glucose, Bld: 174 mg/dL — ABNORMAL HIGH (ref 70–99)
Glucose, Bld: 175 mg/dL — ABNORMAL HIGH (ref 70–99)
Glucose, Bld: 177 mg/dL — ABNORMAL HIGH (ref 70–99)
Glucose, Bld: 191 mg/dL — ABNORMAL HIGH (ref 70–99)
Glucose, Bld: 191 mg/dL — ABNORMAL HIGH (ref 70–99)
Glucose, Bld: 194 mg/dL — ABNORMAL HIGH (ref 70–99)
Glucose, Bld: 202 mg/dL — ABNORMAL HIGH (ref 70–99)
Glucose, Bld: 202 mg/dL — ABNORMAL HIGH (ref 70–99)
Glucose, Bld: 205 mg/dL — ABNORMAL HIGH (ref 70–99)
Glucose, Bld: 212 mg/dL — ABNORMAL HIGH (ref 70–99)
Glucose, Bld: 213 mg/dL — ABNORMAL HIGH (ref 70–99)
Glucose, Bld: 216 mg/dL — ABNORMAL HIGH (ref 70–99)
Glucose, Bld: 237 mg/dL — ABNORMAL HIGH (ref 70–99)
Glucose, Bld: 239 mg/dL — ABNORMAL HIGH (ref 70–99)
Glucose, Bld: 244 mg/dL — ABNORMAL HIGH (ref 70–99)
HCT: 33 % — ABNORMAL LOW (ref 39.0–52.0)
HCT: 35 % — ABNORMAL LOW (ref 39.0–52.0)
HCT: 35 % — ABNORMAL LOW (ref 39.0–52.0)
HCT: 35 % — ABNORMAL LOW (ref 39.0–52.0)
HCT: 35 % — ABNORMAL LOW (ref 39.0–52.0)
HCT: 35 % — ABNORMAL LOW (ref 39.0–52.0)
HCT: 36 % — ABNORMAL LOW (ref 39.0–52.0)
HCT: 36 % — ABNORMAL LOW (ref 39.0–52.0)
HCT: 36 % — ABNORMAL LOW (ref 39.0–52.0)
HCT: 39 % (ref 39.0–52.0)
HCT: 40 % (ref 39.0–52.0)
HCT: 41 % (ref 39.0–52.0)
HCT: 41 % (ref 39.0–52.0)
HCT: 41 % (ref 39.0–52.0)
HCT: 41 % (ref 39.0–52.0)
HCT: 41 % (ref 39.0–52.0)
HCT: 41 % (ref 39.0–52.0)
HCT: 42 % (ref 39.0–52.0)
HCT: 43 % (ref 39.0–52.0)
HCT: 53 % — ABNORMAL HIGH (ref 39.0–52.0)
Hemoglobin: 11.2 g/dL — ABNORMAL LOW (ref 13.0–17.0)
Hemoglobin: 11.9 g/dL — ABNORMAL LOW (ref 13.0–17.0)
Hemoglobin: 11.9 g/dL — ABNORMAL LOW (ref 13.0–17.0)
Hemoglobin: 11.9 g/dL — ABNORMAL LOW (ref 13.0–17.0)
Hemoglobin: 11.9 g/dL — ABNORMAL LOW (ref 13.0–17.0)
Hemoglobin: 11.9 g/dL — ABNORMAL LOW (ref 13.0–17.0)
Hemoglobin: 12.2 g/dL — ABNORMAL LOW (ref 13.0–17.0)
Hemoglobin: 12.2 g/dL — ABNORMAL LOW (ref 13.0–17.0)
Hemoglobin: 12.2 g/dL — ABNORMAL LOW (ref 13.0–17.0)
Hemoglobin: 13.3 g/dL (ref 13.0–17.0)
Hemoglobin: 13.6 g/dL (ref 13.0–17.0)
Hemoglobin: 13.9 g/dL (ref 13.0–17.0)
Hemoglobin: 13.9 g/dL (ref 13.0–17.0)
Hemoglobin: 13.9 g/dL (ref 13.0–17.0)
Hemoglobin: 13.9 g/dL (ref 13.0–17.0)
Hemoglobin: 13.9 g/dL (ref 13.0–17.0)
Hemoglobin: 13.9 g/dL (ref 13.0–17.0)
Hemoglobin: 14.3 g/dL (ref 13.0–17.0)
Hemoglobin: 14.6 g/dL (ref 13.0–17.0)
Hemoglobin: 18 g/dL — ABNORMAL HIGH (ref 13.0–17.0)
Potassium: 3.5 mmol/L (ref 3.5–5.1)
Potassium: 3.6 mmol/L (ref 3.5–5.1)
Potassium: 3.6 mmol/L (ref 3.5–5.1)
Potassium: 3.6 mmol/L (ref 3.5–5.1)
Potassium: 3.6 mmol/L (ref 3.5–5.1)
Potassium: 3.7 mmol/L (ref 3.5–5.1)
Potassium: 3.7 mmol/L (ref 3.5–5.1)
Potassium: 3.8 mmol/L (ref 3.5–5.1)
Potassium: 3.8 mmol/L (ref 3.5–5.1)
Potassium: 3.8 mmol/L (ref 3.5–5.1)
Potassium: 3.8 mmol/L (ref 3.5–5.1)
Potassium: 3.8 mmol/L (ref 3.5–5.1)
Potassium: 3.8 mmol/L (ref 3.5–5.1)
Potassium: 3.9 mmol/L (ref 3.5–5.1)
Potassium: 3.9 mmol/L (ref 3.5–5.1)
Potassium: 3.9 mmol/L (ref 3.5–5.1)
Potassium: 4 mmol/L (ref 3.5–5.1)
Potassium: 4 mmol/L (ref 3.5–5.1)
Potassium: 4 mmol/L (ref 3.5–5.1)
Potassium: 4 mmol/L (ref 3.5–5.1)
Sodium: 134 mmol/L — ABNORMAL LOW (ref 135–145)
Sodium: 134 mmol/L — ABNORMAL LOW (ref 135–145)
Sodium: 134 mmol/L — ABNORMAL LOW (ref 135–145)
Sodium: 134 mmol/L — ABNORMAL LOW (ref 135–145)
Sodium: 134 mmol/L — ABNORMAL LOW (ref 135–145)
Sodium: 134 mmol/L — ABNORMAL LOW (ref 135–145)
Sodium: 134 mmol/L — ABNORMAL LOW (ref 135–145)
Sodium: 135 mmol/L (ref 135–145)
Sodium: 136 mmol/L (ref 135–145)
Sodium: 136 mmol/L (ref 135–145)
Sodium: 136 mmol/L (ref 135–145)
Sodium: 136 mmol/L (ref 135–145)
Sodium: 136 mmol/L (ref 135–145)
Sodium: 137 mmol/L (ref 135–145)
Sodium: 137 mmol/L (ref 135–145)
Sodium: 137 mmol/L (ref 135–145)
Sodium: 137 mmol/L (ref 135–145)
Sodium: 137 mmol/L (ref 135–145)
Sodium: 138 mmol/L (ref 135–145)
Sodium: 138 mmol/L (ref 135–145)
TCO2: 25 mmol/L (ref 22–32)
TCO2: 26 mmol/L (ref 22–32)
TCO2: 26 mmol/L (ref 22–32)
TCO2: 26 mmol/L (ref 22–32)
TCO2: 27 mmol/L (ref 22–32)
TCO2: 27 mmol/L (ref 22–32)
TCO2: 27 mmol/L (ref 22–32)
TCO2: 27 mmol/L (ref 22–32)
TCO2: 27 mmol/L (ref 22–32)
TCO2: 28 mmol/L (ref 22–32)
TCO2: 28 mmol/L (ref 22–32)
TCO2: 28 mmol/L (ref 22–32)
TCO2: 28 mmol/L (ref 22–32)
TCO2: 28 mmol/L (ref 22–32)
TCO2: 28 mmol/L (ref 22–32)
TCO2: 28 mmol/L (ref 22–32)
TCO2: 29 mmol/L (ref 22–32)
TCO2: 29 mmol/L (ref 22–32)
TCO2: 29 mmol/L (ref 22–32)
TCO2: 30 mmol/L (ref 22–32)

## 2023-02-08 LAB — RENAL FUNCTION PANEL
Albumin: 2.8 g/dL — ABNORMAL LOW (ref 3.5–5.0)
Albumin: 2.8 g/dL — ABNORMAL LOW (ref 3.5–5.0)
Anion gap: 18 — ABNORMAL HIGH (ref 5–15)
Anion gap: 17 — ABNORMAL HIGH (ref 5–15)
BUN: 44 mg/dL — ABNORMAL HIGH (ref 6–20)
BUN: 45 mg/dL — ABNORMAL HIGH (ref 6–20)
CO2: 25 mmol/L (ref 22–32)
CO2: 25 mmol/L (ref 22–32)
Calcium: 10.8 mg/dL — ABNORMAL HIGH (ref 8.9–10.3)
Calcium: 10.9 mg/dL — ABNORMAL HIGH (ref 8.9–10.3)
Chloride: 92 mmol/L — ABNORMAL LOW (ref 98–111)
Chloride: 92 mmol/L — ABNORMAL LOW (ref 98–111)
Creatinine, Ser: 2.79 mg/dL — ABNORMAL HIGH (ref 0.61–1.24)
Creatinine, Ser: 2.75 mg/dL — ABNORMAL HIGH (ref 0.61–1.24)
GFR, Estimated: 29 mL/min — ABNORMAL LOW (ref >60)
GFR, Estimated: 30 mL/min — ABNORMAL LOW (ref >60)
Glucose, Bld: 157 mg/dL — ABNORMAL HIGH (ref 70–99)
Glucose, Bld: 183 mg/dL — ABNORMAL HIGH (ref 70–99)
Phosphorus: 4.6 mg/dL (ref 2.5–4.6)
Phosphorus: 4.6 mg/dL (ref 2.5–4.6)
Potassium: 3.4 mmol/L — ABNORMAL LOW (ref 3.5–5.1)
Potassium: 3.8 mmol/L (ref 3.5–5.1)
Sodium: 135 mmol/L (ref 135–145)
Sodium: 134 mmol/L — ABNORMAL LOW (ref 135–145)

## 2023-02-08 LAB — CBC
HCT: 33.6 % — ABNORMAL LOW (ref 39.0–52.0)
Hemoglobin: 10.4 g/dL — ABNORMAL LOW (ref 13.0–17.0)
MCH: 32.6 pg (ref 26.0–34.0)
MCHC: 31 g/dL (ref 30.0–36.0)
MCV: 105.3 fL — ABNORMAL HIGH (ref 80.0–100.0)
Platelets: 318 10*3/uL (ref 150–400)
RBC: 3.19 MIL/uL — ABNORMAL LOW (ref 4.22–5.81)
RDW: 18.7 % — ABNORMAL HIGH (ref 11.5–15.5)
WBC: 12.2 10*3/uL — ABNORMAL HIGH (ref 4.0–10.5)
nRBC: 0 % (ref 0.0–0.2)

## 2023-02-08 LAB — COOXEMETRY PANEL
Carboxyhemoglobin: 1.8 % — ABNORMAL HIGH (ref 0.5–1.5)
Methemoglobin: 0.9 % (ref 0.0–1.5)
O2 Saturation: 62.9 %
Total hemoglobin: 11.3 g/dL — ABNORMAL LOW (ref 12.0–16.0)

## 2023-02-08 LAB — GLUCOSE, CAPILLARY
Glucose-Capillary: 157 mg/dL — ABNORMAL HIGH (ref 70–99)
Glucose-Capillary: 201 mg/dL — ABNORMAL HIGH (ref 70–99)
Glucose-Capillary: 150 mg/dL — ABNORMAL HIGH (ref 70–99)
Glucose-Capillary: 138 mg/dL — ABNORMAL HIGH (ref 70–99)
Glucose-Capillary: 164 mg/dL — ABNORMAL HIGH (ref 70–99)

## 2023-02-08 LAB — SEDIMENTATION RATE: Sed Rate: 105 mm/hr — ABNORMAL HIGH (ref 0–16)

## 2023-02-08 LAB — MAGNESIUM: Magnesium: 2.2 mg/dL (ref 1.7–2.4)

## 2023-02-08 LAB — PROCALCITONIN: Procalcitonin: 25.12 ng/mL

## 2023-02-08 MED ORDER — POTASSIUM CHLORIDE 20 MEQ PO PACK: 40.0000 meq | PACK | Freq: Two times a day (BID) | ORAL | Status: DC

## 2023-02-08 MED ORDER — POTASSIUM CHLORIDE 20 MEQ PO PACK
40.0000 meq | PACK | Freq: Once | ORAL | Status: AC
  Administered 2023-02-08: 40 meq
  Filled 2023-02-08: qty 2

## 2023-02-08 MED ORDER — DEXMEDETOMIDINE HCL IN NACL 400 MCG/100ML IV SOLN
0.0000 ug/kg/h | INTRAVENOUS | Status: DC
  Administered 2023-02-08: 0.4 ug/kg/h via INTRAVENOUS
  Administered 2023-02-08 (×3): 0.7 ug/kg/h via INTRAVENOUS
  Administered 2023-02-09 (×2): 0.4 ug/kg/h via INTRAVENOUS
  Administered 2023-02-09: 0.7 ug/kg/h via INTRAVENOUS
  Administered 2023-02-09: 0.5 ug/kg/h via INTRAVENOUS
  Administered 2023-02-10: 0.8 ug/kg/h via INTRAVENOUS
  Administered 2023-02-10: 0.6 ug/kg/h via INTRAVENOUS
  Administered 2023-02-10: 0.7 ug/kg/h via INTRAVENOUS
  Administered 2023-02-10: 0.4 ug/kg/h via INTRAVENOUS
  Administered 2023-02-10 (×2): 0.9 ug/kg/h via INTRAVENOUS
  Administered 2023-02-11: 1 ug/kg/h via INTRAVENOUS
  Administered 2023-02-11: 0.8 ug/kg/h via INTRAVENOUS
  Administered 2023-02-11: 0.9 ug/kg/h via INTRAVENOUS
  Administered 2023-02-11 (×2): 0.8 ug/kg/h via INTRAVENOUS
  Administered 2023-02-11 (×2): 1 ug/kg/h via INTRAVENOUS
  Administered 2023-02-11 – 2023-02-12 (×2): 0.8 ug/kg/h via INTRAVENOUS
  Administered 2023-02-12: 1 ug/kg/h via INTRAVENOUS
  Administered 2023-02-12: 0.9 ug/kg/h via INTRAVENOUS
  Administered 2023-02-12: 0.8 ug/kg/h via INTRAVENOUS
  Administered 2023-02-12: 0.9 ug/kg/h via INTRAVENOUS
  Administered 2023-02-12: 0.8 ug/kg/h via INTRAVENOUS
  Administered 2023-02-12: 0.9 ug/kg/h via INTRAVENOUS
  Administered 2023-02-12: 1 ug/kg/h via INTRAVENOUS
  Administered 2023-02-12: 1.2 ug/kg/h via INTRAVENOUS
  Administered 2023-02-12 – 2023-02-13 (×2): 1 ug/kg/h via INTRAVENOUS
  Administered 2023-02-13: 0.8 ug/kg/h via INTRAVENOUS
  Administered 2023-02-13 (×3): 1 ug/kg/h via INTRAVENOUS
  Administered 2023-02-13: 0.7 ug/kg/h via INTRAVENOUS
  Administered 2023-02-13: 1 ug/kg/h via INTRAVENOUS
  Administered 2023-02-13: 0.8 ug/kg/h via INTRAVENOUS
  Administered 2023-02-13: 0.9 ug/kg/h via INTRAVENOUS
  Administered 2023-02-13: 0.8 ug/kg/h via INTRAVENOUS
  Administered 2023-02-14 (×2): 1.5 ug/kg/h via INTRAVENOUS
  Administered 2023-02-14: 0.9 ug/kg/h via INTRAVENOUS
  Administered 2023-02-14 (×2): 1.2 ug/kg/h via INTRAVENOUS
  Administered 2023-02-14: 1.5 ug/kg/h via INTRAVENOUS
  Administered 2023-02-14: 1 ug/kg/h via INTRAVENOUS
  Administered 2023-02-14 (×2): 1.2 ug/kg/h via INTRAVENOUS
  Administered 2023-02-14 (×2): 1.5 ug/kg/h via INTRAVENOUS
  Administered 2023-02-15: 1 ug/kg/h via INTRAVENOUS
  Administered 2023-02-15 (×2): 1.2 ug/kg/h via INTRAVENOUS
  Administered 2023-02-15: 1.1 ug/kg/h via INTRAVENOUS
  Administered 2023-02-15: 1 ug/kg/h via INTRAVENOUS
  Administered 2023-02-15: 0.4 ug/kg/h via INTRAVENOUS
  Administered 2023-02-15 (×5): 1.2 ug/kg/h via INTRAVENOUS
  Administered 2023-02-15: 1.1 ug/kg/h via INTRAVENOUS
  Administered 2023-02-16 (×2): 1 ug/kg/h via INTRAVENOUS
  Administered 2023-02-16: 0.8 ug/kg/h via INTRAVENOUS
  Administered 2023-02-16: 1.2 ug/kg/h via INTRAVENOUS
  Administered 2023-02-16: 1 ug/kg/h via INTRAVENOUS
  Administered 2023-02-16: 1.2 ug/kg/h via INTRAVENOUS
  Administered 2023-02-16: 1 ug/kg/h via INTRAVENOUS
  Administered 2023-02-16 (×2): 1.2 ug/kg/h via INTRAVENOUS
  Administered 2023-02-16 – 2023-02-20 (×35): 1 ug/kg/h via INTRAVENOUS
  Administered 2023-02-20: 0.9 ug/kg/h via INTRAVENOUS
  Administered 2023-02-20 – 2023-02-21 (×7): 1 ug/kg/h via INTRAVENOUS
  Administered 2023-02-21 (×2): 0.8 ug/kg/h via INTRAVENOUS
  Administered 2023-02-21 (×5): 1 ug/kg/h via INTRAVENOUS
  Administered 2023-02-21: 0.8 ug/kg/h via INTRAVENOUS
  Administered 2023-02-21 – 2023-02-23 (×19): 1 ug/kg/h via INTRAVENOUS
  Administered 2023-02-24: 0.1 ug/kg/h via INTRAVENOUS
  Administered 2023-02-24 (×6): 1 ug/kg/h via INTRAVENOUS
  Administered 2023-02-24: 0.9 ug/kg/h via INTRAVENOUS
  Administered 2023-02-24 – 2023-02-25 (×4): 1 ug/kg/h via INTRAVENOUS
  Administered 2023-02-25 (×3): 0.9 ug/kg/h via INTRAVENOUS
  Filled 2023-02-08: qty 100
  Filled 2023-02-08 (×2): qty 200
  Filled 2023-02-08 (×8): qty 100
  Filled 2023-02-08: qty 200
  Filled 2023-02-08 (×7): qty 100
  Filled 2023-02-08: qty 200
  Filled 2023-02-08 (×7): qty 100
  Filled 2023-02-08: qty 200
  Filled 2023-02-08: qty 100
  Filled 2023-02-08: qty 200
  Filled 2023-02-08: qty 500
  Filled 2023-02-08 (×4): qty 100
  Filled 2023-02-08: qty 200
  Filled 2023-02-08 (×8): qty 100
  Filled 2023-02-08: qty 400
  Filled 2023-02-08 (×8): qty 100
  Filled 2023-02-08: qty 300
  Filled 2023-02-08 (×3): qty 100
  Filled 2023-02-08: qty 200
  Filled 2023-02-08 (×9): qty 100
  Filled 2023-02-08: qty 200
  Filled 2023-02-08 (×2): qty 100
  Filled 2023-02-08: qty 200
  Filled 2023-02-08 (×2): qty 100
  Filled 2023-02-08 (×2): qty 200
  Filled 2023-02-08 (×3): qty 100
  Filled 2023-02-08 (×2): qty 200
  Filled 2023-02-08 (×11): qty 100
  Filled 2023-02-08: qty 400
  Filled 2023-02-08 (×4): qty 100
  Filled 2023-02-08 (×2): qty 200
  Filled 2023-02-08: qty 600
  Filled 2023-02-08: qty 200
  Filled 2023-02-08 (×3): qty 100

## 2023-02-08 NOTE — Progress Notes (Signed)
NAME:  Phillip Young, MRN:  [id number], DOB:  15-Mar-1986, LOS: 18 ADMISSION DATE:  01/21/2023, CONSULTATION DATE:  01/22/23 REFERRING MD:  Danise Edge, CHIEF COMPLAINT:  SOB   History of Present Illness:  37 year old man well known to the PCCM and AHF services who presented to [facility] 6/28 with fevers, joint pains, SOB. PMHx significant for ESRD, NICM, refractory AFib, ?amio-induced lung toxicity. Patient reportedly missed HD on Thursday (has a history of leaving HD early). Reportedly ~10kg over dry weight with worsening breathing status, AFib/RVR and borderline BP.  PCCM consulted for ICU admission/further evaluation and management.  Pertinent Medical History:   Past Medical History:  Diagnosis Date   Acute on chronic respiratory failure with hypoxia ([name]) 04/21/2021   Acute on chronic systolic (congestive) heart failure ([name]) 02/26/2020   Amiodarone toxicity    Anemia    Atrial flutter ([name])    Biventricular congestive heart failure ([name])    Chronic hypoxemic respiratory failure ([name])    Class 3 severe obesity due to excess calories with serious comorbidity and body mass index (BMI) of 50.0 to 59.9 in adult [facility]) 02/26/2020   Essential hypertension 02/26/2020   GERD without esophagitis 02/26/2020   Hidradenitis suppurativa 02/26/2020   NICM (nonischemic cardiomyopathy) ([name])    Obesity hypoventilation syndrome ([name])    OSA (obstructive sleep apnea)    PAF (paroxysmal atrial fibrillation) ([name])    Pneumonia    Prediabetes 02/26/2020   Significant Hospital Events: Including procedures, antibiotic start and stop dates in addition to other pertinent events   6/28 Admit, emergent HD 6/29 Afib/RVR, shock, ICU transfer 6/30 Intubated and placed on vasopressor support and started on CRRT 7/1 Remains intubated on CRRT continue with pressor support 7/2 Agitated with SAT, remains on CRRT and low dose levophed 7/3 low dose NE, ongoing CRRT 7/4 Attempted SBT this am, apneic but severely agitated,  desaturated, HR up 130/140s.  Re-sedated but HR remains 140's, NE up from 6 to . NSR on EKG.  Was pulling 50-100on UF, since kept even for now.  CVP 8 7/5 DCCV again attempted this a.m. with success, remains on norepinephrine and vasopressin.  7/6 went into A-fib with RVR, attempted DCCV x 3, started on amiodarone infusion after amiodarone bolus 7/7 Multiple DCCV attempts without success. TF held due to vomiting. Tx denied to [facility]  7/8 KUB looked OK. No further vomiting. Tolerating TF. Large BM overnight.  7/9 vasopressors back up, remains rapid afib/ flutter, Bmx 1, wua causing hemodynamic instability 7/10 weaned sedation on mothers request, HR remains 140-220s, tmax 100.6, DNR, no escalation  Interim History / Subjective:  No events Still in RVR Remains on amio, pressors.  Trying to limit sedation, but he is uncomfortable Net even x 24 hours. 13 L positive for the admission.   Objective:  Blood pressure (!) 149/135, pulse (!) 128, temperature 99.2 F (37.3 C), temperature source Oral, resp. rate (!) 33, height 6' (1.829 m), weight (!) 174.5 kg, SpO2 99%. CVP:  [0 mmHg-19 mmHg] 10 mmHg  Vent Mode: PRVC FiO2 (%):  [40 %-50 %] 50 % Set Rate:  [24 bmp] 24 bmp Vt Set:  [600 mL-620 mL] 620 mL PEEP:  [5 cmH20] 5 cmH20 Plateau Pressure:  [23 cmH20-26 cmH20] 25 cmH20   Intake/Output Summary (Last 24 hours) at 02/08/2023 0924 Last data filed at 02/08/2023 0900 Gross per 24 hour  Intake 6013.02 ml  Output 5584 ml  Net 429.02 ml   Filed Weights   02/06/23 0600 02/07/23 0600 02/08/23  0500  Weight: (!) 177.4 kg (!) 177.3 kg (!) 174.5 kg   Physical Examination: General:  obese adult male on vent Neuro:  Sedate. Will weakly follow commands in all four extremities.  HEENT:  [place]/AT, PERRL Cardiovascular:  Tachy, IRIR. Rates 140.  Lungs:  Clear distant bilateral breath sounds Abdomen:  Soft, non-distended, non-tender Musculoskeletal:  No acute deformity Skin:  Intact, MMM  Sig labs:  Glucose 220, Creat 2.4, K 3.4  Resolved Hospital Problem List:  Hematuria due to traumatic Foley insertion  Assessment & Plan:  Acute-on-chronic hypoxemic respiratory failure Bilateral multifocal pneumonia- initially strep, question if superimposed HCAP; improvement in WBC with broad spectrum abx argues for the HCAP Pulmonary edema due to noncompliance with hemodialysis/AonC HF; stable OHS/OSA, noncompliant with CPAP Acute on chronic HFrEF Paroxysmal rapid A-fib/flutter with RVR- see previous notes, refractory, some question of previous amio lung toxicity (05/2022): reviewed the imaging, nodular changes were fairly minimal, I think benefit outweighs risks at this point: started 7/13 Nonischemic cardiomyopathy Ongoing shock state- sepsis being treated, WBC coming down, fever curve improving, underlying shock may be more renal vasoplegia so think we can be more liberal w/ alpha agonism: started 7/13.  [facility] has been contacted: (A) no beds and (B) no additional treatments to offer. End-stage renal disease on hemodialysis Hypervolemic hyponatremia Hyperphosphatemia Hyperkalemia  High anion gap metabolic acidosis Hypercalcemia  - Dc amiodarone. No real benefit from it and history of possible lung tox. Mother wants it off as well.  - Midodrine, levo/vaso for MAP 65 - Trying to limit sedation, but he is uncomfortable/RVR rates drive up. Follows commands weakly. Try precedex infusion.  - Linezolid/meropenem until 02/11/23 (7 days) - CRRT for now keep even, ideally will pull if his BP /HR can tolerate - Trend Pct, ESR; both heading in right direction - Mother updated at bedside 02/08/23 - Ongoing GOC talks, family is not ready at this time to consider transitioning to a natural death, prognosis remains grim regardless of treatment path  Best Practice (right click and "Reselect all SmartList Selections" daily)   Diet/type: tube feeds DVT prophylaxis: DOAC GI prophylaxis: Protonix Lines: Yes  still needed Foley: n/a Code Status:  Full Last date of multidisciplinary goals of care: 02/04/23   31 min cc time   Joneen Roach, AGACNP-BC [facility]  See Amion for personal pager PCCM on call pager [phone] until 7pm. Please call Elink 7p-7a. [phone]  02/08/2023 9:51 AM

## 2023-02-08 NOTE — Plan of Care (Signed)
  Problem: Clinical Measurements: Goal: Ability to maintain clinical measurements within normal limits will improve Outcome: Progressing Goal: Will remain free from infection Outcome: Progressing Goal: Diagnostic test results will improve Outcome: Progressing Goal: Respiratory complications will improve Outcome: Progressing Goal: Cardiovascular complication will be avoided Outcome: Not Progressing   Problem: Nutrition: Goal: Adequate nutrition will be maintained Outcome: Progressing   Problem: Elimination: Goal: Will not experience complications related to bowel motility Outcome: Progressing Goal: Will not experience complications related to urinary retention Outcome: Not Progressing   Problem: Pain Managment: Goal: General experience of comfort will improve Outcome: Progressing   Problem: Skin Integrity: Goal: Risk for impaired skin integrity will decrease Outcome: Not Progressing   Problem: Fluid Volume: Goal: Ability to maintain a balanced intake and output will improve Outcome: Progressing   Problem: Metabolic: Goal: Ability to maintain appropriate glucose levels will improve Outcome: Not Progressing   Problem: Nutritional: Goal: Maintenance of adequate nutrition will improve Outcome: Progressing Goal: Progress toward achieving an optimal weight will improve Outcome: Progressing

## 2023-02-08 NOTE — Progress Notes (Signed)
[facility] Progress Note    Assessment/ Plan:   Sepsis/septic shock: strep pneumoniae grew on initial BCx's (6/28) w/ presumed PNA. Was on rocephin initially then changed to meropenem and linezolid. Still requiring Levo and vaso at high doses.  Also on midodrine AHRF: vent support per PCCM ESRD: Required emergent HD on 6/29 because of fluid overload.  Then developed shock and afib/ RVR requiring high doses of pressors.  Started CRRT on 6/29. CRRT was stopped briefly 7/11 then resumed 7/12. Cont CRRT. UF goals: net even to net neg 50cc/hr as tolerated CRRT anticoagulation - pt required change to citrate protocol for frequent clotting started 7/06.  We cont using citrate protocol due to excessive clotting and intolerance to heparin. There has been a shortage of the dialysate (low bicarb/ zero Ca) designed for citrate protocol-can resume once bags available.  Cont to monitor iCal Volume:  Keeping even w/ CRRT for now Acute on chronic systolic CHF, nonischemic: EF 16-10%.  Anemia of CKD: Hemoglobin at goal.  Received Aranesp on 7/1 a nd 7/08. Hgb 10.4 Hypercalcemia: due to citrate protocol. Ca++ peaked at `12 and improved now down to 9-10 range. Cont to follow.  A-fib with RVR with hypotension: S/P cardioversion 7/5 -> NSR -> afib RVR 7/6 early AM refractory to [facility] x3 + amiodarone.   Grim prognosis, GOC discussions with family per primary service  Subjective:   Patient seen and examined on CRRT. No acute events. Remains on levo, vaso, amio gtt's. Continues to be in afib w/ RVR Discussed with pt's mom at the bedside. Discussed with ICU RN at the bedside.   Objective:   BP (!) 149/135 (BP Location: Left Wrist)   Pulse (!) 119   Temp 99.2 F (37.3 C) (Oral)   Resp (!) 30   Ht 6' (1.829 m)   Wt (!) 174.5 kg   SpO2 99%   BMI 52.18 kg/m   Intake/Output Summary (Last 24 hours) at 02/08/2023 0835 Last data filed at 02/08/2023 0800 Gross per 24 hour  Intake 6171.12 ml  Output  5524 ml  Net 647.12 ml   Weight change: -2.8 kg  Physical Exam: Gen: ill appearing, intubated/sedated CVS: irreg irreg Resp: coarse breath sounds diffusely, intubated Abd: obese, soft, nt/nd Ext: no sig edema B/L Les Neuro: sedated Dialysis access: RIJ TDC in use  Imaging: DG CHEST PORT 1 VIEW  Result Date: 02/08/2023 CLINICAL DATA:  Shortness of breath EXAM: PORTABLE CHEST 1 VIEW COMPARISON:  Previous studies including the examination of 02/06/2023 FINDINGS: Transverse diameter of heart is increased. There is decreasing pulmonary vascular congestion. Still, there is prominence of central pulmonary vessels and increased interstitial markings in the parahilar regions and lower lung fields. Tip of endotracheal tube is 4.7 cm above the carina. Tip of dialysis catheter is seen in the region of right atrium. Tip of left IJ central venous catheter is seen in superior vena cava. NG tube is noted traversing the esophagus. IMPRESSION: Cardiomegaly. There is partial clearing of signs of CHF. No new focal infiltrates are seen. Electronically Signed   By: Ernie Avena M.D.   On: 02/08/2023 08:26   DG Abd Portable 1V  Result Date: 02/07/2023 CLINICAL DATA:  Distension EXAM: PORTABLE ABDOMEN - 1 VIEW COMPARISON:  02/01/2023 FINDINGS: Esophageal tube tip and side port in the left upper quadrant. Limited by body habitus. Some air within the colon. No convincing dilated small bowel. IMPRESSION: Very limited by habitus.  Nonobstructed gas pattern Electronically Signed   By:  Jasmine Pang M.D.   On: 02/07/2023 23:05    Labs: BMET Recent Labs  Lab 02/05/23 0401 02/05/23 0729 02/05/23 1537 02/05/23 2321 02/06/23 0447 02/06/23 0617 02/06/23 1729 02/06/23 2115 02/07/23 0418 02/07/23 0541 02/07/23 1550 02/07/23 1845 02/07/23 2238 02/07/23 2240 02/08/23 0028 02/08/23 0032 02/08/23 0428 02/08/23 0429 02/08/23 0500  NA 133*   < > 133*   < > 135   < > 134*   < > 137   < > 135   < > 135 135  136 135 134* 137 135  K 5.5*   < > 4.9   < > 4.3   < > 4.1   < > 4.2   < > 3.8   < > 3.7 3.8 3.6 3.6 3.6 3.5 3.4*  CL 93*   < > 93*   < > 88*   < > 90*   < > 92*   < > 92*   < > 96* 93* 93* 97* 94* 92* 92*  CO2 21*  --  20*  --  24  --  24  --  28  --  24  --   --   --   --   --   --   --  25  GLUCOSE 239*   < > 179*   < > 148*   < > 134*   < > 125*   < > 146*   < > 190* 219* 205* 169* 151* 191* 157*  BUN 98*   < > 79*   < > 68*   < > 57*   < > 48*   < > 42*   < > 43* 39* 39* 47* 42* 39* 44*  CREATININE 5.61*   < > 4.13*   < > 3.50*   < > 3.09*   < > 2.95*   < > 2.87*   < > 3.00* 2.70* 2.40* 3.10* 3.00* 2.50* 2.79*  CALCIUM 9.9  --  10.2  --  10.8*  --  10.7*  --  10.9*  --  10.9*  --   --   --   --   --   --   --  10.8*  PHOS 6.1*  --  5.6*  --  5.1*  --  4.7*  --  5.0*  --  4.5  --   --   --   --   --   --   --  4.6   < > = values in this interval not displayed.   CBC Recent Labs  Lab 02/05/23 0401 02/05/23 0729 02/06/23 0447 02/06/23 0617 02/07/23 0418 02/07/23 0541 02/08/23 0032 02/08/23 0428 02/08/23 0429 02/08/23 0500  WBC 25.3*  --  18.1*  --  13.2*  --   --   --   --  12.2*  NEUTROABS 22.8*  --   --   --   --   --   --   --   --   --   HGB 11.7*   < > 10.9*   < > 10.3*   < > 18.0* 11.9* 13.3 10.4*  HCT 37.0*   < > 35.2*   < > 33.0*   < > 53.0* 35.0* 39.0 33.6*  MCV 103.9*  --  104.1*  --  105.4*  --   --   --   --  105.3*  PLT 219  --  267  --  275  --   --   --   --  318   < > = values in this interval not displayed.    Medications:     apixaban  5 mg Per Tube BID   Chlorhexidine Gluconate Cloth  6 each Topical Q0600   feeding supplement (PROSource TF20)  60 mL Per Tube Q4H   fentaNYL (SUBLIMAZE) injection  50 mcg Intravenous Once   Gerhardt's butt cream   Topical Daily   hydrOXYzine  10 mg Per Tube TID   insulin aspart  0-9 Units Subcutaneous Q4H   insulin glargine-yfgn  14 Units Subcutaneous Daily   lidocaine  1 patch Transdermal Q24H   melatonin  5 mg Per Tube  QHS   metoCLOPramide (REGLAN) injection  5 mg Intravenous Q8H   midodrine  10 mg Per Tube TID WC   multivitamin  1 tablet Per Tube QHS   nutrition supplement (JUVEN)  1 packet Per Tube BID BM   mouth rinse  15 mL Mouth Rinse Q2H   pantoprazole (PROTONIX) IV  40 mg Intravenous Q24H      Anthony Sar, MD [facility] 02/08/2023, 8:35 AM

## 2023-02-08 NOTE — Progress Notes (Signed)
   02/08/23 1540  Spiritual Encounters  Type of Visit Follow up  Conversation partners present during encounter Nurse  Reason for visit Routine spiritual support  OnCall Visit No   Chaplain came to room hoping to connect with [name] mother, but as I arrived Rn told me that she had just left for the day and would be back in the morning. Chaplain will return tomorrow hoping to connect.

## 2023-02-08 NOTE — Consult Note (Addendum)
WOC Nurse Consult Note: Reason for Consult: DTPI left hip Discussed MASD of the buttocks and fissure in the gluteal cleft as well at the time of my visit  Wound type: MASD; moisture associated skin damage buttocks Dark areas that are not blanchable on the left hip; left flank; back.  They do not present like DTPIs however more strand like or spotty over the areas, scattered. May be related to pressor use and skin/tissue perfusion Pressure Injury POA: NA Measurement: scattered areas; left hip; 1-2 cm each, less than 0.5cm wide, intact skin Wound bed:see above  Drainage (amount, consistency, odor) none Periwound: edema  Dressing procedure/placement/frequency: Continue silicone foam per the skin care nursing set, monitor for acute changes.  Will have them add silver hydrofiber to address excessive moisture in the gluteal cleft; under the foam dressing  Re-consult WOC nursing if needed.   Discussed POC with patient and bedside nurse.  Re consult if needed, will not follow at this time. Thanks  Kijuan Gallicchio [employer], RN,CWOCN, CNS, CWON-AP [phone])

## 2023-02-09 ENCOUNTER — Inpatient Hospital Stay (HOSPITAL_COMMUNITY): Payer: BC Managed Care – PPO

## 2023-02-09 ENCOUNTER — Telehealth (HOSPITAL_COMMUNITY): Payer: Self-pay | Admitting: Cardiology

## 2023-02-09 DIAGNOSIS — J9601 Acute respiratory failure with hypoxia: Secondary | ICD-10-CM | POA: Diagnosis not present

## 2023-02-09 LAB — POCT I-STAT, CHEM 8
BUN: 39 mg/dL — ABNORMAL HIGH (ref 6–20)
BUN: 41 mg/dL — ABNORMAL HIGH (ref 6–20)
BUN: 47 mg/dL — ABNORMAL HIGH (ref 6–20)
BUN: 46 mg/dL — ABNORMAL HIGH (ref 6–20)
BUN: 45 mg/dL — ABNORMAL HIGH (ref 6–20)
BUN: 47 mg/dL — ABNORMAL HIGH (ref 6–20)
BUN: 55 mg/dL — ABNORMAL HIGH (ref 6–20)
BUN: 49 mg/dL — ABNORMAL HIGH (ref 6–20)
BUN: 51 mg/dL — ABNORMAL HIGH (ref 6–20)
BUN: 48 mg/dL — ABNORMAL HIGH (ref 6–20)
BUN: 55 mg/dL — ABNORMAL HIGH (ref 6–20)
Calcium, Ion: 0.32 mmol/L — CL (ref 1.15–1.40)
Calcium, Ion: 0.31 mmol/L — CL (ref 1.15–1.40)
Calcium, Ion: 1.1 mmol/L — ABNORMAL LOW (ref 1.15–1.40)
Calcium, Ion: 0.3 mmol/L — CL (ref 1.15–1.40)
Calcium, Ion: <0.3 mmol/L — CL (ref 1.15–1.40)
Calcium, Ion: 1.11 mmol/L — ABNORMAL LOW (ref 1.15–1.40)
Calcium, Ion: <0.3 mmol/L — CL (ref 1.15–1.40)
Calcium, Ion: 0.31 mmol/L — CL (ref 1.15–1.40)
Calcium, Ion: 1.11 mmol/L — ABNORMAL LOW (ref 1.15–1.40)
Calcium, Ion: 0.3 mmol/L — CL (ref 1.15–1.40)
Calcium, Ion: 1.12 mmol/L — ABNORMAL LOW (ref 1.15–1.40)
Chloride: 93 mmol/L — ABNORMAL LOW (ref 98–111)
Chloride: 94 mmol/L — ABNORMAL LOW (ref 98–111)
Chloride: 96 mmol/L — ABNORMAL LOW (ref 98–111)
Chloride: 93 mmol/L — ABNORMAL LOW (ref 98–111)
Chloride: 93 mmol/L — ABNORMAL LOW (ref 98–111)
Chloride: 92 mmol/L — ABNORMAL LOW (ref 98–111)
Chloride: 94 mmol/L — ABNORMAL LOW (ref 98–111)
Chloride: 92 mmol/L — ABNORMAL LOW (ref 98–111)
Chloride: 97 mmol/L — ABNORMAL LOW (ref 98–111)
Chloride: 93 mmol/L — ABNORMAL LOW (ref 98–111)
Chloride: 95 mmol/L — ABNORMAL LOW (ref 98–111)
Creatinine, Ser: 2.2 mg/dL — ABNORMAL HIGH (ref 0.61–1.24)
Creatinine, Ser: 2.2 mg/dL — ABNORMAL HIGH (ref 0.61–1.24)
Creatinine, Ser: 3 mg/dL — ABNORMAL HIGH (ref 0.61–1.24)
Creatinine, Ser: 2.1 mg/dL — ABNORMAL HIGH (ref 0.61–1.24)
Creatinine, Ser: 2 mg/dL — ABNORMAL HIGH (ref 0.61–1.24)
Creatinine, Ser: 1.9 mg/dL — ABNORMAL HIGH (ref 0.61–1.24)
Creatinine, Ser: 3 mg/dL — ABNORMAL HIGH (ref 0.61–1.24)
Creatinine, Ser: 2.2 mg/dL — ABNORMAL HIGH (ref 0.61–1.24)
Creatinine, Ser: 2.7 mg/dL — ABNORMAL HIGH (ref 0.61–1.24)
Creatinine, Ser: 2.1 mg/dL — ABNORMAL HIGH (ref 0.61–1.24)
Creatinine, Ser: 2.9 mg/dL — ABNORMAL HIGH (ref 0.61–1.24)
Glucose, Bld: 228 mg/dL — ABNORMAL HIGH (ref 70–99)
Glucose, Bld: 199 mg/dL — ABNORMAL HIGH (ref 70–99)
Glucose, Bld: 244 mg/dL — ABNORMAL HIGH (ref 70–99)
Glucose, Bld: 224 mg/dL — ABNORMAL HIGH (ref 70–99)
Glucose, Bld: 237 mg/dL — ABNORMAL HIGH (ref 70–99)
Glucose, Bld: 219 mg/dL — ABNORMAL HIGH (ref 70–99)
Glucose, Bld: 254 mg/dL — ABNORMAL HIGH (ref 70–99)
Glucose, Bld: 191 mg/dL — ABNORMAL HIGH (ref 70–99)
Glucose, Bld: 250 mg/dL — ABNORMAL HIGH (ref 70–99)
Glucose, Bld: 227 mg/dL — ABNORMAL HIGH (ref 70–99)
Glucose, Bld: 268 mg/dL — ABNORMAL HIGH (ref 70–99)
HCT: 42 % (ref 39.0–52.0)
HCT: 35 % — ABNORMAL LOW (ref 39.0–52.0)
HCT: 44 % (ref 39.0–52.0)
HCT: 43 % (ref 39.0–52.0)
HCT: 44 % (ref 39.0–52.0)
HCT: 37 % — ABNORMAL LOW (ref 39.0–52.0)
HCT: 47 % (ref 39.0–52.0)
HCT: 36 % — ABNORMAL LOW (ref 39.0–52.0)
HCT: 40 % (ref 39.0–52.0)
HCT: 36 % — ABNORMAL LOW (ref 39.0–52.0)
HCT: 45 % (ref 39.0–52.0)
Hemoglobin: 14.3 g/dL (ref 13.0–17.0)
Hemoglobin: 11.9 g/dL — ABNORMAL LOW (ref 13.0–17.0)
Hemoglobin: 15 g/dL (ref 13.0–17.0)
Hemoglobin: 14.6 g/dL (ref 13.0–17.0)
Hemoglobin: 15 g/dL (ref 13.0–17.0)
Hemoglobin: 12.6 g/dL — ABNORMAL LOW (ref 13.0–17.0)
Hemoglobin: 16 g/dL (ref 13.0–17.0)
Hemoglobin: 12.2 g/dL — ABNORMAL LOW (ref 13.0–17.0)
Hemoglobin: 13.6 g/dL (ref 13.0–17.0)
Hemoglobin: 12.2 g/dL — ABNORMAL LOW (ref 13.0–17.0)
Hemoglobin: 15.3 g/dL (ref 13.0–17.0)
Potassium: 3.8 mmol/L (ref 3.5–5.1)
Potassium: 3.7 mmol/L (ref 3.5–5.1)
Potassium: 3.9 mmol/L (ref 3.5–5.1)
Potassium: 3.7 mmol/L (ref 3.5–5.1)
Potassium: 3.7 mmol/L (ref 3.5–5.1)
Potassium: 3.8 mmol/L (ref 3.5–5.1)
Potassium: 3.8 mmol/L (ref 3.5–5.1)
Potassium: 3.6 mmol/L (ref 3.5–5.1)
Potassium: 3.6 mmol/L (ref 3.5–5.1)
Potassium: 3.6 mmol/L (ref 3.5–5.1)
Potassium: 3.9 mmol/L (ref 3.5–5.1)
Sodium: 137 mmol/L (ref 135–145)
Sodium: 134 mmol/L — ABNORMAL LOW (ref 135–145)
Sodium: 137 mmol/L (ref 135–145)
Sodium: 136 mmol/L (ref 135–145)
Sodium: 137 mmol/L (ref 135–145)
Sodium: 133 mmol/L — ABNORMAL LOW (ref 135–145)
Sodium: 137 mmol/L (ref 135–145)
Sodium: 134 mmol/L — ABNORMAL LOW (ref 135–145)
Sodium: 136 mmol/L (ref 135–145)
Sodium: 134 mmol/L — ABNORMAL LOW (ref 135–145)
Sodium: 137 mmol/L (ref 135–145)
TCO2: 27 mmol/L (ref 22–32)
TCO2: 25 mmol/L (ref 22–32)
TCO2: 26 mmol/L (ref 22–32)
TCO2: 27 mmol/L (ref 22–32)
TCO2: 29 mmol/L (ref 22–32)
TCO2: 27 mmol/L (ref 22–32)
TCO2: 28 mmol/L (ref 22–32)
TCO2: 25 mmol/L (ref 22–32)
TCO2: 27 mmol/L (ref 22–32)
TCO2: 26 mmol/L (ref 22–32)
TCO2: 27 mmol/L (ref 22–32)

## 2023-02-09 LAB — RENAL FUNCTION PANEL
Albumin: 2.8 g/dL — ABNORMAL LOW (ref 3.5–5.0)
Anion gap: 19 — ABNORMAL HIGH (ref 5–15)
BUN: 54 mg/dL — ABNORMAL HIGH (ref 6–20)
CO2: 24 mmol/L (ref 22–32)
Calcium: 11.6 mg/dL — ABNORMAL HIGH (ref 8.9–10.3)
Chloride: 92 mmol/L — ABNORMAL LOW (ref 98–111)
Creatinine, Ser: 2.85 mg/dL — ABNORMAL HIGH (ref 0.61–1.24)
GFR, Estimated: 28 mL/min — ABNORMAL LOW (ref >60)
Glucose, Bld: 204 mg/dL — ABNORMAL HIGH (ref 70–99)
Phosphorus: 4.7 mg/dL — ABNORMAL HIGH (ref 2.5–4.6)
Potassium: 3.7 mmol/L (ref 3.5–5.1)
Sodium: 135 mmol/L (ref 135–145)

## 2023-02-09 LAB — POCT I-STAT 7, (LYTES, BLD GAS, ICA,H+H)
Acid-Base Excess: 3 mmol/L — ABNORMAL HIGH (ref 0.0–2.0)
Acid-Base Excess: 1 mmol/L (ref 0.0–2.0)
Acid-Base Excess: 2 mmol/L (ref 0.0–2.0)
Bicarbonate: 28 mmol/L (ref 20.0–28.0)
Bicarbonate: 26.8 mmol/L (ref 20.0–28.0)
Bicarbonate: 27.1 mmol/L (ref 20.0–28.0)
Calcium, Ion: 1.11 mmol/L — ABNORMAL LOW (ref 1.15–1.40)
Calcium, Ion: 1.11 mmol/L — ABNORMAL LOW (ref 1.15–1.40)
Calcium, Ion: 1.1 mmol/L — ABNORMAL LOW (ref 1.15–1.40)
HCT: 36 % — ABNORMAL LOW (ref 39.0–52.0)
HCT: 36 % — ABNORMAL LOW (ref 39.0–52.0)
HCT: 37 % — ABNORMAL LOW (ref 39.0–52.0)
Hemoglobin: 12.2 g/dL — ABNORMAL LOW (ref 13.0–17.0)
Hemoglobin: 12.2 g/dL — ABNORMAL LOW (ref 13.0–17.0)
Hemoglobin: 12.6 g/dL — ABNORMAL LOW (ref 13.0–17.0)
O2 Saturation: 97 %
O2 Saturation: 94 %
O2 Saturation: 95 %
Patient temperature: 99.5
Patient temperature: 99.6
Patient temperature: 99.8
Potassium: 3.8 mmol/L (ref 3.5–5.1)
Potassium: 3.8 mmol/L (ref 3.5–5.1)
Potassium: 3.8 mmol/L (ref 3.5–5.1)
Sodium: 134 mmol/L — ABNORMAL LOW (ref 135–145)
Sodium: 134 mmol/L — ABNORMAL LOW (ref 135–145)
Sodium: 133 mmol/L — ABNORMAL LOW (ref 135–145)
TCO2: 29 mmol/L (ref 22–32)
TCO2: 28 mmol/L (ref 22–32)
TCO2: 28 mmol/L (ref 22–32)
pCO2 arterial: 45.6 mmHg (ref 32–48)
pCO2 arterial: 45.5 mmHg (ref 32–48)
pCO2 arterial: 43.6 mmHg (ref 32–48)
pH, Arterial: 7.398 (ref 7.35–7.45)
pH, Arterial: 7.381 (ref 7.35–7.45)
pH, Arterial: 7.404 (ref 7.35–7.45)
pO2, Arterial: 96 mmHg (ref 83–108)
pO2, Arterial: 74 mmHg — ABNORMAL LOW (ref 83–108)
pO2, Arterial: 78 mmHg — ABNORMAL LOW (ref 83–108)

## 2023-02-09 LAB — BASIC METABOLIC PANEL
Anion gap: 17 — ABNORMAL HIGH (ref 5–15)
BUN: 54 mg/dL — ABNORMAL HIGH (ref 6–20)
CO2: 24 mmol/L (ref 22–32)
Calcium: 11 mg/dL — ABNORMAL HIGH (ref 8.9–10.3)
Chloride: 94 mmol/L — ABNORMAL LOW (ref 98–111)
Creatinine, Ser: 2.8 mg/dL — ABNORMAL HIGH (ref 0.61–1.24)
GFR, Estimated: 29 mL/min — ABNORMAL LOW (ref >60)
Glucose, Bld: 178 mg/dL — ABNORMAL HIGH (ref 70–99)
Potassium: 3.7 mmol/L (ref 3.5–5.1)
Sodium: 135 mmol/L (ref 135–145)

## 2023-02-09 LAB — GLUCOSE, CAPILLARY
Glucose-Capillary: 146 mg/dL — ABNORMAL HIGH (ref 70–99)
Glucose-Capillary: 154 mg/dL — ABNORMAL HIGH (ref 70–99)
Glucose-Capillary: 159 mg/dL — ABNORMAL HIGH (ref 70–99)
Glucose-Capillary: 163 mg/dL — ABNORMAL HIGH (ref 70–99)
Glucose-Capillary: 166 mg/dL — ABNORMAL HIGH (ref 70–99)
Glucose-Capillary: 172 mg/dL — ABNORMAL HIGH (ref 70–99)
Glucose-Capillary: 177 mg/dL — ABNORMAL HIGH (ref 70–99)

## 2023-02-09 LAB — COOXEMETRY PANEL
Carboxyhemoglobin: 1.2 % (ref 0.5–1.5)
Methemoglobin: 0.8 % (ref 0.0–1.5)
O2 Saturation: 50.1 %
Total hemoglobin: 11.6 g/dL — ABNORMAL LOW (ref 12.0–16.0)

## 2023-02-09 LAB — CBC
HCT: 35.7 % — ABNORMAL LOW (ref 39.0–52.0)
Hemoglobin: 11 g/dL — ABNORMAL LOW (ref 13.0–17.0)
MCH: 32.7 pg (ref 26.0–34.0)
MCHC: 30.8 g/dL (ref 30.0–36.0)
MCV: 106.3 fL — ABNORMAL HIGH (ref 80.0–100.0)
Platelets: 359 10*3/uL (ref 150–400)
RBC: 3.36 MIL/uL — ABNORMAL LOW (ref 4.22–5.81)
RDW: 18.6 % — ABNORMAL HIGH (ref 11.5–15.5)
WBC: 12.8 10*3/uL — ABNORMAL HIGH (ref 4.0–10.5)
nRBC: 0 % (ref 0.0–0.2)

## 2023-02-09 LAB — ALBUMIN: Albumin: 2.8 g/dL — ABNORMAL LOW (ref 3.5–5.0)

## 2023-02-09 LAB — MAGNESIUM: Magnesium: 2.1 mg/dL (ref 1.7–2.4)

## 2023-02-09 LAB — PROCALCITONIN: Procalcitonin: 19.01 ng/mL

## 2023-02-09 LAB — PHOSPHORUS: Phosphorus: 4.4 mg/dL (ref 2.5–4.6)

## 2023-02-09 LAB — SEDIMENTATION RATE: Sed Rate: 100 mm/hr — ABNORMAL HIGH (ref 0–16)

## 2023-02-09 MED ORDER — MIDODRINE HCL 5 MG PO TABS
20.0000 mg | ORAL_TABLET | Freq: Three times a day (TID) | ORAL | Status: DC
  Administered 2023-02-09 – 2023-02-10 (×3): 20 mg
  Filled 2023-02-09 (×3): qty 4

## 2023-02-09 MED ORDER — SODIUM CHLORIDE 0.9% FLUSH
10.0000 mL | Freq: Two times a day (BID) | INTRAVENOUS | Status: DC
  Administered 2023-02-09: 10 mL via INTRAVENOUS
  Administered 2023-02-09: 40 mL via INTRAVENOUS
  Administered 2023-02-10 – 2023-02-11 (×3): 10 mL via INTRAVENOUS
  Administered 2023-02-12: 40 mL via INTRAVENOUS
  Administered 2023-02-12 – 2023-02-15 (×7): 10 mL via INTRAVENOUS
  Administered 2023-02-16: 20 mL via INTRAVENOUS
  Administered 2023-02-16 – 2023-02-17 (×2): 10 mL via INTRAVENOUS
  Administered 2023-02-17: 20 mL via INTRAVENOUS
  Administered 2023-02-18 – 2023-03-01 (×23): 10 mL via INTRAVENOUS
  Administered 2023-03-02: 20 mL via INTRAVENOUS
  Administered 2023-03-02: 10 mL via INTRAVENOUS

## 2023-02-09 MED ORDER — FENTANYL CITRATE PF 50 MCG/ML IJ SOSY
50.0000 ug | PREFILLED_SYRINGE | INTRAMUSCULAR | Status: DC | PRN
  Administered 2023-02-09: 50 ug via INTRAVENOUS
  Administered 2023-02-10 – 2023-02-11 (×5): 100 ug via INTRAVENOUS
  Administered 2023-02-12: 50 ug via INTRAVENOUS
  Administered 2023-02-13 – 2023-02-14 (×2): 100 ug via INTRAVENOUS
  Filled 2023-02-09 (×2): qty 2
  Filled 2023-02-09: qty 1
  Filled 2023-02-09: qty 2
  Filled 2023-02-09: qty 1
  Filled 2023-02-09 (×4): qty 2

## 2023-02-09 MED ORDER — SODIUM CHLORIDE 0.9% FLUSH: 10.0000 mL | INTRAVENOUS | Status: DC | PRN

## 2023-02-09 NOTE — Progress Notes (Addendum)
[facility] Progress Note    Assessment/ Plan:   Sepsis/septic shock: strep pneumoniae grew on initial BCx's (6/28) w/ presumed PNA. Was on rocephin initially then changed to meropenem and linezolid. Still requiring Levo and vaso at high doses.  Also on midodrine AHRF: vent support per PCCM ESRD: Required emergent HD on 6/29 because of fluid overload.  Then developed shock and afib/ RVR requiring high doses of pressors.  Started CRRT on 6/29. CRRT was stopped briefly 7/11 then resumed 7/12. Cont CRRT. UF goals: net even to net neg 50cc/hr as tolerated CRRT anticoagulation - pt required change to citrate protocol for frequent clotting started 7/06.  We cont using citrate protocol due to excessive clotting and intolerance to heparin. There has been a shortage of the dialysate (low bicarb/ zero Ca) designed for citrate protocol-can resume once bags available.  Cont to monitor iCal Volume:  UF goal net even to net neg 50cc/hr Acute on chronic systolic CHF, nonischemic: EF 09-81%.  Anemia of CKD: Hemoglobin at goal.  Received Aranesp on 7/1 and 7/08. Hgb 15 Hypercalcemia: due to citrate protocol. Ca++ peaked at `12 and improved now down to 9-10 range. Cont to follow.  A-fib with RVR with hypotension: S/P cardioversion 7/5 -> NSR -> afib RVR 7/6 early AM refractory to [facility] x3 + amiodarone.  Grim prognosis, GOC discussions with family per primary service Discussed with ICU RN. I saw and evaluated the patient on CRRT.  I reviewed the last 24 hours events.  Adjustments to CRRT prescription are made as needed.   Subjective:   Patient seen and examined on CRRT. No acute events. Remains on levo, vaso, amio gtt's. Continues to be in afib w/ RVR Tolerating CRRT. Net neg ~1.6L Total net pos ~14.8L   Objective:   BP (!) 149/135 (BP Location: Left Wrist)   Pulse (!) 106   Temp (!) 100.4 F (38 C)   Resp (!) 29   Ht 6' (1.829 m)   Wt (!) 172.9 kg   SpO2 96%   BMI 51.70 kg/m    Intake/Output Summary (Last 24 hours) at 02/09/2023 0819 Last data filed at 02/09/2023 0700 Gross per 24 hour  Intake 4841.93 ml  Output 6562.7 ml  Net -1720.77 ml   Weight change: -1.6 kg  Physical Exam: Gen: ill appearing, intubated/sedated CVS: irreg irreg Resp: diminished air entry bibasilar, intubated Abd: obese, soft, nt/nd Ext: no sig edema B/L Les Neuro: sedated Dialysis access: RIJ TDC in use  Imaging: DG CHEST PORT 1 VIEW  Result Date: 02/09/2023 CLINICAL DATA:  Shortness of breath. EXAM: PORTABLE CHEST 1 VIEW COMPARISON:  02/08/2023 FINDINGS: Endotracheal tube tip is 4 cm above the base of the carina. Right IJ central line tip overlies the right atrium near the junction with the SVC. Left IJ central line tip overlies the SVC/RA junction. The NG tube passes into the stomach although the distal tip position is not included on the film. The cardio pericardial silhouette is enlarged. Vascular congestion with diffuse interstitial opacity consistent with edema. No substantial pleural effusion. Telemetry leads overlie the chest. IMPRESSION: 1. Support apparatus as described. 2. Vascular congestion with diffuse interstitial opacity consistent with edema. No substantial change. Electronically Signed   By: Kennith Center M.D.   On: 02/09/2023 06:33   DG CHEST PORT 1 VIEW  Result Date: 02/08/2023 CLINICAL DATA:  Shortness of breath EXAM: PORTABLE CHEST 1 VIEW COMPARISON:  Previous studies including the examination of 02/06/2023 FINDINGS: Transverse diameter of heart is  increased. There is decreasing pulmonary vascular congestion. Still, there is prominence of central pulmonary vessels and increased interstitial markings in the parahilar regions and lower lung fields. Tip of endotracheal tube is 4.7 cm above the carina. Tip of dialysis catheter is seen in the region of right atrium. Tip of left IJ central venous catheter is seen in superior vena cava. NG tube is noted traversing the  esophagus. IMPRESSION: Cardiomegaly. There is partial clearing of signs of CHF. No new focal infiltrates are seen. Electronically Signed   By: Ernie Avena M.D.   On: 02/08/2023 08:26   DG Abd Portable 1V  Result Date: 02/07/2023 CLINICAL DATA:  Distension EXAM: PORTABLE ABDOMEN - 1 VIEW COMPARISON:  02/01/2023 FINDINGS: Esophageal tube tip and side port in the left upper quadrant. Limited by body habitus. Some air within the colon. No convincing dilated small bowel. IMPRESSION: Very limited by habitus.  Nonobstructed gas pattern Electronically Signed   By: Jasmine Pang M.D.   On: 02/07/2023 23:05    Labs: BMET Recent Labs  Lab 02/06/23 0447 02/06/23 0617 02/06/23 1729 02/06/23 2115 02/07/23 0418 02/07/23 0541 02/07/23 1550 02/07/23 1845 02/08/23 0500 02/08/23 0837 02/08/23 1654 02/08/23 1656 02/08/23 1759 02/08/23 2020 02/08/23 2030 02/08/23 2253 02/08/23 2258 02/09/23 0220 02/09/23 0226 02/09/23 0525 02/09/23 0527 02/09/23 0537  NA 135   < > 134*   < > 137   < > 135   < > 135   < > 134*   < > 134*   < > 137 134* 137 134* 136 135 133* 137  K 4.3   < > 4.1   < > 4.2   < > 3.8   < > 3.4*   < > 3.8   < > 4.0   < > 3.8 3.9 3.7 3.8 3.7 3.7 3.8 3.7  CL 88*   < > 90*   < > 92*   < > 92*   < > 92*   < > 92*   < > 96*  --  93* 96* 94*  --  93* 94*  --  93*  CO2 24  --  24  --  28  --  24  --  25  --  25  --   --   --   --   --   --   --   --  24  --   --   GLUCOSE 148*   < > 134*   < > 125*   < > 146*   < > 157*   < > 183*   < > 202*  --  228* 199* 244*  --  224* 178*  --  237*  BUN 68*   < > 57*   < > 48*   < > 42*   < > 44*   < > 45*   < > 42*  --  39* 47* 41*  --  46* 54*  --  45*  CREATININE 3.50*   < > 3.09*   < > 2.95*   < > 2.87*   < > 2.79*   < > 2.75*   < > 3.10*  --  2.20* 3.00* 2.20*  --  2.10* 2.80*  --  2.00*  CALCIUM 10.8*  --  10.7*  --  10.9*  --  10.9*  --  10.8*  --  10.9*  --   --   --   --   --   --   --   --  11.0*  --   --   PHOS 5.1*  --  4.7*  --  5.0*   --  4.5  --  4.6  --  4.6  --   --   --   --   --   --   --   --  4.4  --   --    < > = values in this interval not displayed.   CBC Recent Labs  Lab 02/05/23 0401 02/05/23 0729 02/06/23 0447 02/06/23 0617 02/07/23 0418 02/07/23 0541 02/08/23 0500 02/08/23 0837 02/09/23 0226 02/09/23 0525 02/09/23 0527 02/09/23 0537  WBC 25.3*  --  18.1*  --  13.2*  --  12.2*  --   --  12.8*  --   --   NEUTROABS 22.8*  --   --   --   --   --   --   --   --   --   --   --   HGB 11.7*   < > 10.9*   < > 10.3*   < > 10.4*   < > 14.6 11.0* 12.6* 15.0  HCT 37.0*   < > 35.2*   < > 33.0*   < > 33.6*   < > 43.0 35.7* 37.0* 44.0  MCV 103.9*  --  104.1*  --  105.4*  --  105.3*  --   --  106.3*  --   --   PLT 219  --  267  --  275  --  318  --   --  359  --   --    < > = values in this interval not displayed.    Medications:     apixaban  5 mg Per Tube BID   Chlorhexidine Gluconate Cloth  6 each Topical Q0600   feeding supplement (PROSource TF20)  60 mL Per Tube Q4H   fentaNYL (SUBLIMAZE) injection  50 mcg Intravenous Once   Gerhardt's butt cream   Topical Daily   hydrOXYzine  10 mg Per Tube TID   insulin aspart  0-9 Units Subcutaneous Q4H   insulin glargine-yfgn  14 Units Subcutaneous Daily   lidocaine  1 patch Transdermal Q24H   melatonin  5 mg Per Tube QHS   metoCLOPramide (REGLAN) injection  5 mg Intravenous Q8H   midodrine  10 mg Per Tube TID WC   multivitamin  1 tablet Per Tube QHS   nutrition supplement (JUVEN)  1 packet Per Tube BID BM   mouth rinse  15 mL Mouth Rinse Q2H   pantoprazole (PROTONIX) IV  40 mg Intravenous Q24H      Anthony Sar, MD [facility] 02/09/2023, 8:19 AM

## 2023-02-09 NOTE — Plan of Care (Signed)
  Problem: Clinical Measurements: Goal: Respiratory complications will improve Outcome: Progressing   Problem: Nutrition: Goal: Adequate nutrition will be maintained Outcome: Progressing   Problem: Clinical Measurements: Goal: Diagnostic test results will improve Outcome: Progressing   Problem: Elimination: Goal: Will not experience complications related to bowel motility Outcome: Progressing   Problem: Pain Managment: Goal: General experience of comfort will improve Outcome: Progressing

## 2023-02-09 NOTE — Progress Notes (Signed)
NAME:  Phillip Young, MRN:  [id number], DOB:  August 06, 1985, LOS: 19 ADMISSION DATE:  01/21/2023, CONSULTATION DATE:  01/22/23 REFERRING MD:  Danise Edge, CHIEF COMPLAINT:  SOB   History of Present Illness:  37 year old man well known to the PCCM and AHF services who presented to [facility] 6/28 with fevers, joint pains, SOB. PMHx significant for ESRD, NICM, refractory AFib, ?amio-induced lung toxicity. Patient reportedly missed HD on Thursday (has a history of leaving HD early). Reportedly ~10kg over dry weight with worsening breathing status, AFib/RVR and borderline BP.  PCCM consulted for ICU admission/further evaluation and management.  Pertinent Medical History:   Past Medical History:  Diagnosis Date   Acute on chronic respiratory failure with hypoxia ([name]) 04/21/2021   Acute on chronic systolic (congestive) heart failure ([name]) 02/26/2020   Amiodarone toxicity    Anemia    Atrial flutter ([name])    Biventricular congestive heart failure ([name])    Chronic hypoxemic respiratory failure ([name])    Class 3 severe obesity due to excess calories with serious comorbidity and body mass index (BMI) of 50.0 to 59.9 in adult [facility]) 02/26/2020   Essential hypertension 02/26/2020   GERD without esophagitis 02/26/2020   Hidradenitis suppurativa 02/26/2020   NICM (nonischemic cardiomyopathy) ([name])    Obesity hypoventilation syndrome ([name])    OSA (obstructive sleep apnea)    PAF (paroxysmal atrial fibrillation) ([name])    Pneumonia    Prediabetes 02/26/2020   Significant Hospital Events: Including procedures, antibiotic start and stop dates in addition to other pertinent events   6/28 Admit, emergent HD 6/29 Afib/RVR, shock, ICU transfer 6/30 Intubated and placed on vasopressor support and started on CRRT 7/1 Remains intubated on CRRT continue with pressor support 7/2 Agitated with SAT, remains on CRRT and low dose levophed 7/3 low dose NE, ongoing CRRT 7/4 Attempted SBT this am, apneic but severely agitated,  desaturated, HR up 130/140s.  Re-sedated but HR remains 140's, NE up from 6 to . NSR on EKG.  Was pulling 50-100on UF, since kept even for now.  CVP 8 7/5 DCCV again attempted this a.m. with success, remains on norepinephrine and vasopressin.  7/6 went into A-fib with RVR, attempted DCCV x 3, started on amiodarone infusion after amiodarone bolus 7/7 Multiple DCCV attempts without success. TF held due to vomiting. Tx denied to [facility]  7/8 KUB looked OK. No further vomiting. Tolerating TF. Large BM overnight.  7/9 vasopressors back up, remains rapid afib/ flutter, Bmx 1, wua causing hemodynamic instability 7/10 weaned sedation on mothers request, HR remains 140-220s, tmax 100.6, DNR, no escalation  Interim History / Subjective:  Off fent and amio gtt Low grade temp  Objective:  Blood pressure (!) 149/135, pulse (!) 165, temperature (!) 100.4 F (38 C), resp. rate (!) 30, height 6' (1.829 m), weight (!) 172.9 kg, SpO2 100%. CVP:  [6 mmHg-18 mmHg] 11 mmHg  Vent Mode: PRVC FiO2 (%):  [40 %] 40 % Set Rate:  [24 bmp] 24 bmp Vt Set:  [620 mL] 620 mL PEEP:  [5 cmH20] 5 cmH20 Plateau Pressure:  [23 cmH20-27 cmH20] 26 cmH20   Intake/Output Summary (Last 24 hours) at 02/09/2023 0917 Last data filed at 02/09/2023 0900 Gross per 24 hour  Intake 4940.73 ml  Output 6731.4 ml  Net -1790.67 ml   Filed Weights   02/07/23 0600 02/08/23 0500 02/09/23 0400  Weight: (!) 177.3 kg (!) 174.5 kg (!) 172.9 kg   Physical Examination: On vent Small pupils, equal Diffuse mild anasarca Diminished breath  sounds bilaterally Starting to follow commands Ext warm Heart sounds irregular, tachy  26 mcg/min of levophed Remains on CRRT  Resolved Hospital Problem List:  Hematuria due to traumatic Foley insertion  Assessment & Plan:  Acute-on-chronic hypoxemic respiratory failure Bilateral multifocal pneumonia- initially strep, question if superimposed HCAP; improvement in WBC with broad spectrum abx argues  for the HCAP Pulmonary edema due to noncompliance with hemodialysis/AonC HF; stable OHS/OSA, noncompliant with CPAP Acute on chronic HFrEF Paroxysmal rapid A-fib/flutter with RVR- see previous notes, refractory, some question of previous amio lung toxicity (05/2022): reviewed the imaging, nodular changes were fairly minimal, I think benefit outweighs risks at this point: started 7/13 Nonischemic cardiomyopathy Ongoing shock state- sepsis being treated, WBC coming down, fever curve improving, underlying shock may be more renal vasoplegia so think we can be more liberal w/ alpha agonism: started 7/13.  [facility] has been contacted: (A) no beds and (B) no additional treatments to offer. End-stage renal disease on hemodialysis Hypervolemic hyponatremia Hyperphosphatemia Hyperkalemia  High anion gap metabolic acidosis Hypercalcemia  - Dc amiodarone 02/08/23. No real benefit from it and history of possible lung tox. Mother wants it off as well.  - Midodrine, levo/vaso for MAP 65 - Precedex and PRN fent targeting RASS -1 - SAT/SBT as able: failed this am from [facility] - Linezolid/meropenem until 02/11/23 (7 days) - CRRT for now keep even, ideally will pull if his BP /HR can tolerate - Trend Pct, ESR; both heading in right direction - Mother updated at bedside 02/08/23 - Ongoing GOC talks, family is not ready at this time to consider transitioning to a natural death, prognosis remains grim regardless of treatment path - Do not think a good trach candidate until we show he can tolerate iHD  Best Practice (right click and "Reselect all SmartList Selections" daily)   Diet/type: tube feeds DVT prophylaxis: DOAC GI prophylaxis: Protonix Lines: Yes still needed Foley: n/a Code Status:  Full Last date of multidisciplinary goals of care: 02/08/23; told her high chance he will die this hospitlization no matter what we do   32 min cc time Myrla Halsted MD PCCM  See Amion for personal pager PCCM on call pager  [phone] until 7pm. Please call Elink 7p-7a. [phone]  02/09/2023 9:17 AM

## 2023-02-09 NOTE — Telephone Encounter (Signed)
RE: In office appt for pre op clearance per pt request Received: Today Theresa Duty M  Mihaela Fajardo M, CMA; Magda Bernheim M, CMA; Derald Macleod A Pt is currently in the hospital       Previous Messages    ----- Message ----- From: Phillip Young, CMA Sent: 01/13/2023   1:55 PM EDT To: Wille Glaser; Chantel Constance Haw, CMA; * Subject: In office appt for pre op clearance per pt r*  Hi ladies,  Se notes below. I assured trh pt that someone will call him with an appt.  Thank you Okey Regal   I s/w the pt about tele appt for pre op. Pt states he wants an IN OFFICE appt with Dr. Shirlee Latch or APP instead. I assured him that I will send a message to [facility] and they will call him with an appt for IN OFFICE for pre op clearance. Pt said thank you. I will update the requesting office as FYI.

## 2023-02-09 NOTE — Progress Notes (Signed)
   02/09/23 1400  Spiritual Encounters  Type of Visit Follow up  Care provided to: Patient  Reason for visit Routine spiritual support  OnCall Visit No   Chaplain returned to room today hoping to connect with [name] mother and provide support, but she was not p[resent.  I noticed the [name] eyes were open and not knowing his level of consciousness I attempted to engage him.  I asked simple yes and no questions and asked Pt to move certain parts of his face to respond.  Though he did not respond with the cues I gave him I did note that there was movement in other ways that appeared intentional to me.  I spoke words of encouragement and assured him that we were all pulling for his recovery. Chaplain will f/u tomorrow still attempting to connect with [name] mother.

## 2023-02-10 ENCOUNTER — Inpatient Hospital Stay (HOSPITAL_COMMUNITY): Payer: BC Managed Care – PPO

## 2023-02-10 DIAGNOSIS — J9601 Acute respiratory failure with hypoxia: Secondary | ICD-10-CM | POA: Diagnosis not present

## 2023-02-10 LAB — COOXEMETRY PANEL
Carboxyhemoglobin: 1.5 % (ref 0.5–1.5)
Methemoglobin: <0.7 % (ref 0.0–1.5)
O2 Saturation: 49.2 %
Total hemoglobin: 11.8 g/dL — ABNORMAL LOW (ref 12.0–16.0)

## 2023-02-10 LAB — RENAL FUNCTION PANEL
Albumin: 2.9 g/dL — ABNORMAL LOW (ref 3.5–5.0)
Albumin: 2.9 g/dL — ABNORMAL LOW (ref 3.5–5.0)
Anion gap: 21 — ABNORMAL HIGH (ref 5–15)
Anion gap: 19 — ABNORMAL HIGH (ref 5–15)
BUN: 63 mg/dL — ABNORMAL HIGH (ref 6–20)
BUN: 59 mg/dL — ABNORMAL HIGH (ref 6–20)
CO2: 23 mmol/L (ref 22–32)
CO2: 24 mmol/L (ref 22–32)
Calcium: 11.7 mg/dL — ABNORMAL HIGH (ref 8.9–10.3)
Calcium: 11.8 mg/dL — ABNORMAL HIGH (ref 8.9–10.3)
Chloride: 89 mmol/L — ABNORMAL LOW (ref 98–111)
Chloride: 91 mmol/L — ABNORMAL LOW (ref 98–111)
Creatinine, Ser: 2.58 mg/dL — ABNORMAL HIGH (ref 0.61–1.24)
Creatinine, Ser: 2.48 mg/dL — ABNORMAL HIGH (ref 0.61–1.24)
GFR, Estimated: 32 mL/min — ABNORMAL LOW (ref >60)
GFR, Estimated: 33 mL/min — ABNORMAL LOW (ref >60)
Glucose, Bld: 195 mg/dL — ABNORMAL HIGH (ref 70–99)
Glucose, Bld: 148 mg/dL — ABNORMAL HIGH (ref 70–99)
Phosphorus: 5.1 mg/dL — ABNORMAL HIGH (ref 2.5–4.6)
Phosphorus: 4.6 mg/dL (ref 2.5–4.6)
Potassium: 3.6 mmol/L (ref 3.5–5.1)
Potassium: 3.6 mmol/L (ref 3.5–5.1)
Sodium: 133 mmol/L — ABNORMAL LOW (ref 135–145)
Sodium: 134 mmol/L — ABNORMAL LOW (ref 135–145)

## 2023-02-10 LAB — POCT I-STAT, CHEM 8
BUN: 48 mg/dL — ABNORMAL HIGH (ref 6–20)
BUN: 50 mg/dL — ABNORMAL HIGH (ref 6–20)
BUN: 52 mg/dL — ABNORMAL HIGH (ref 6–20)
BUN: 54 mg/dL — ABNORMAL HIGH (ref 6–20)
BUN: 61 mg/dL — ABNORMAL HIGH (ref 6–20)
BUN: 67 mg/dL — ABNORMAL HIGH (ref 6–20)
Calcium, Ion: 0.36 mmol/L — CL (ref 1.15–1.40)
Calcium, Ion: 1.13 mmol/L — ABNORMAL LOW (ref 1.15–1.40)
Calcium, Ion: 1.14 mmol/L — ABNORMAL LOW (ref 1.15–1.40)
Calcium, Ion: <0.3 mmol/L — CL (ref 1.15–1.40)
Calcium, Ion: <0.3 mmol/L — CL (ref 1.15–1.40)
Calcium, Ion: <0.3 mmol/L — CL (ref 1.15–1.40)
Chloride: 91 mmol/L — ABNORMAL LOW (ref 98–111)
Chloride: 93 mmol/L — ABNORMAL LOW (ref 98–111)
Chloride: 93 mmol/L — ABNORMAL LOW (ref 98–111)
Chloride: 93 mmol/L — ABNORMAL LOW (ref 98–111)
Chloride: 96 mmol/L — ABNORMAL LOW (ref 98–111)
Chloride: 96 mmol/L — ABNORMAL LOW (ref 98–111)
Creatinine, Ser: 1.8 mg/dL — ABNORMAL HIGH (ref 0.61–1.24)
Creatinine, Ser: 1.9 mg/dL — ABNORMAL HIGH (ref 0.61–1.24)
Creatinine, Ser: 1.9 mg/dL — ABNORMAL HIGH (ref 0.61–1.24)
Creatinine, Ser: 2 mg/dL — ABNORMAL HIGH (ref 0.61–1.24)
Creatinine, Ser: 2.5 mg/dL — ABNORMAL HIGH (ref 0.61–1.24)
Creatinine, Ser: 2.6 mg/dL — ABNORMAL HIGH (ref 0.61–1.24)
Glucose, Bld: 153 mg/dL — ABNORMAL HIGH (ref 70–99)
Glucose, Bld: 176 mg/dL — ABNORMAL HIGH (ref 70–99)
Glucose, Bld: 215 mg/dL — ABNORMAL HIGH (ref 70–99)
Glucose, Bld: 217 mg/dL — ABNORMAL HIGH (ref 70–99)
Glucose, Bld: 240 mg/dL — ABNORMAL HIGH (ref 70–99)
Glucose, Bld: 272 mg/dL — ABNORMAL HIGH (ref 70–99)
HCT: 36 % — ABNORMAL LOW (ref 39.0–52.0)
HCT: 37 % — ABNORMAL LOW (ref 39.0–52.0)
HCT: 42 % (ref 39.0–52.0)
HCT: 44 % (ref 39.0–52.0)
HCT: 45 % (ref 39.0–52.0)
HCT: 45 % (ref 39.0–52.0)
Hemoglobin: 12.2 g/dL — ABNORMAL LOW (ref 13.0–17.0)
Hemoglobin: 12.6 g/dL — ABNORMAL LOW (ref 13.0–17.0)
Hemoglobin: 14.3 g/dL (ref 13.0–17.0)
Hemoglobin: 15 g/dL (ref 13.0–17.0)
Hemoglobin: 15.3 g/dL (ref 13.0–17.0)
Hemoglobin: 15.3 g/dL (ref 13.0–17.0)
Potassium: 3.4 mmol/L — ABNORMAL LOW (ref 3.5–5.1)
Potassium: 3.4 mmol/L — ABNORMAL LOW (ref 3.5–5.1)
Potassium: 3.4 mmol/L — ABNORMAL LOW (ref 3.5–5.1)
Potassium: 3.6 mmol/L (ref 3.5–5.1)
Potassium: 3.7 mmol/L (ref 3.5–5.1)
Potassium: 3.8 mmol/L (ref 3.5–5.1)
Sodium: 134 mmol/L — ABNORMAL LOW (ref 135–145)
Sodium: 135 mmol/L (ref 135–145)
Sodium: 135 mmol/L (ref 135–145)
Sodium: 136 mmol/L (ref 135–145)
Sodium: 137 mmol/L (ref 135–145)
Sodium: 137 mmol/L (ref 135–145)
TCO2: 26 mmol/L (ref 22–32)
TCO2: 26 mmol/L (ref 22–32)
TCO2: 26 mmol/L (ref 22–32)
TCO2: 27 mmol/L (ref 22–32)
TCO2: 28 mmol/L (ref 22–32)
TCO2: 28 mmol/L (ref 22–32)

## 2023-02-10 LAB — POCT I-STAT 7, (LYTES, BLD GAS, ICA,H+H)
Acid-Base Excess: 0 mmol/L (ref 0.0–2.0)
Acid-Base Excess: 0 mmol/L (ref 0.0–2.0)
Bicarbonate: 24.9 mmol/L (ref 20.0–28.0)
Bicarbonate: 25.6 mmol/L (ref 20.0–28.0)
Calcium, Ion: 1.07 mmol/L — ABNORMAL LOW (ref 1.15–1.40)
Calcium, Ion: 1.12 mmol/L — ABNORMAL LOW (ref 1.15–1.40)
HCT: 36 % — ABNORMAL LOW (ref 39.0–52.0)
HCT: 37 % — ABNORMAL LOW (ref 39.0–52.0)
Hemoglobin: 12.2 g/dL — ABNORMAL LOW (ref 13.0–17.0)
Hemoglobin: 12.6 g/dL — ABNORMAL LOW (ref 13.0–17.0)
O2 Saturation: 96 %
O2 Saturation: 99 %
Patient temperature: 98
Patient temperature: 99.2
Potassium: 3.4 mmol/L — ABNORMAL LOW (ref 3.5–5.1)
Potassium: 3.6 mmol/L (ref 3.5–5.1)
Sodium: 133 mmol/L — ABNORMAL LOW (ref 135–145)
Sodium: 134 mmol/L — ABNORMAL LOW (ref 135–145)
TCO2: 26 mmol/L (ref 22–32)
TCO2: 27 mmol/L (ref 22–32)
pCO2 arterial: 42.7 mmHg (ref 32–48)
pCO2 arterial: 43.8 mmHg (ref 32–48)
pH, Arterial: 7.374 (ref 7.35–7.45)
pH, Arterial: 7.375 (ref 7.35–7.45)
pO2, Arterial: 130 mmHg — ABNORMAL HIGH (ref 83–108)
pO2, Arterial: 87 mmHg (ref 83–108)

## 2023-02-10 LAB — CBC
HCT: 36 % — ABNORMAL LOW (ref 39.0–52.0)
Hemoglobin: 11.2 g/dL — ABNORMAL LOW (ref 13.0–17.0)
MCH: 32.3 pg (ref 26.0–34.0)
MCHC: 31.1 g/dL (ref 30.0–36.0)
MCV: 103.7 fL — ABNORMAL HIGH (ref 80.0–100.0)
Platelets: 376 10*3/uL (ref 150–400)
RBC: 3.47 MIL/uL — ABNORMAL LOW (ref 4.22–5.81)
RDW: 18.5 % — ABNORMAL HIGH (ref 11.5–15.5)
WBC: 12.6 10*3/uL — ABNORMAL HIGH (ref 4.0–10.5)
nRBC: 0 % (ref 0.0–0.2)

## 2023-02-10 LAB — GLUCOSE, CAPILLARY
Glucose-Capillary: 102 mg/dL — ABNORMAL HIGH (ref 70–99)
Glucose-Capillary: 133 mg/dL — ABNORMAL HIGH (ref 70–99)
Glucose-Capillary: 158 mg/dL — ABNORMAL HIGH (ref 70–99)
Glucose-Capillary: 159 mg/dL — ABNORMAL HIGH (ref 70–99)
Glucose-Capillary: 166 mg/dL — ABNORMAL HIGH (ref 70–99)
Glucose-Capillary: 167 mg/dL — ABNORMAL HIGH (ref 70–99)
Glucose-Capillary: 223 mg/dL — ABNORMAL HIGH (ref 70–99)

## 2023-02-10 LAB — PROCALCITONIN: Procalcitonin: 12.84 ng/mL

## 2023-02-10 LAB — CALCIUM, IONIZED: Calcium, Ionized, Serum: 4.7 mg/dL (ref 4.5–5.6)

## 2023-02-10 LAB — SEDIMENTATION RATE: Sed Rate: 85 mm/hr — ABNORMAL HIGH (ref 0–16)

## 2023-02-10 LAB — MAGNESIUM: Magnesium: 2 mg/dL (ref 1.7–2.4)

## 2023-02-10 MED ORDER — MIDODRINE HCL 5 MG PO TABS
30.0000 mg | ORAL_TABLET | Freq: Three times a day (TID) | ORAL | Status: DC
  Administered 2023-02-10 – 2023-02-28 (×53): 30 mg via ORAL
  Filled 2023-02-10 (×55): qty 6

## 2023-02-10 MED ORDER — PRISMASOL BGK 4/2.5 32-4-2.5 MEQ/L REPLACEMENT SOLN: Status: DC

## 2023-02-10 MED ORDER — BANATROL TF EN LIQD
60.0000 mL | Freq: Two times a day (BID) | ENTERAL | Status: DC
  Administered 2023-02-10 – 2023-02-26 (×31): 60 mL
  Filled 2023-02-10 (×32): qty 60

## 2023-02-10 MED ORDER — FAMOTIDINE 20 MG PO TABS
20.0000 mg | ORAL_TABLET | Freq: Every day | ORAL | Status: DC
  Administered 2023-02-10 – 2023-03-04 (×21): 20 mg
  Filled 2023-02-10 (×23): qty 1

## 2023-02-10 NOTE — Progress Notes (Signed)
Nutrition Follow-up  DOCUMENTATION CODES:   Morbid obesity  INTERVENTION:   Tube Feeding via Cortrak:  Vital 1.5 at 50 ml/hr Increase Pro-Source TF20 60 mL to every 4 hours TF regimen at goal provides 2280 kcals, 201 g of protein and 1003 mL of free watr   Pt receiving additional calories in form of dextrose via calcium dextrose (ACD-A anticoagulant) for CRRT   Continue Renal MVI daily   Juven BID, each packet provides 80 calories, 8 grams of carbohydrate, 2.5  grams of protein (collagen), 7 grams of L-arginine and 7 grams of L-glutamine; supplement contains CaHMB, Vitamins C, E, B12 and Zinc to promote wound healing  Add Banatrol TF BID via tube- each packet provides  5g soluble fiber    NUTRITION DIAGNOSIS:   Inadequate oral intake related to inability to eat as evidenced by NPO status.  Being addressed via TF  GOAL:   Patient will meet greater than or equal to 90% of their needs  Addressed via TF  MONITOR:   Vent status, TF tolerance, Labs, Weight trends  REASON FOR ASSESSMENT:   Ventilator, Consult Assessment of nutrition requirement/status  ASSESSMENT:   37 year old male with PMHx of ESRD on HD, nonischemic cardiomyopathy, refractory A-fib, possible amio lung toxicity admitted with acute on chronic hypoxmic respiratory failure in setting of virus and missed HD, probable sepsis.  Pt remains on vent support, remains on CRRT with citrate protocol Levophed at 17, vasopressin 0.04  Pt remains Full Code, palliative care following  Tolerating Vital 1.5 at 50 ml/hr with Pro-Source and Juven via Cortrak  Rectal tube in place 535 mL in 24 hours  Hypercalcemia secondary to citrate protocol  Wounds evaluated by WOC RN. Pt with MASD of the buttocks and fissure in gluteal cleft. "Dark areas that are not blanchable on left hip, left flank and back; they do not present like DTPIs; may be related to pressors and skin/tissue perfusion  Labs: BUN 61, Creatinine 2.60,  sodium and potassium wdl, CBGs 133-223 Meds: ss novolog, semglee   Diet Order:   Diet Order     None       EDUCATION NEEDS:   No education needs have been identified at this time  Skin:  Skin Assessment: Skin Integrity Issues: Skin Integrity Issues:: Stage II, Other (Comment) Stage II: coccyx Other: MASD to buttocks with fissure in gluteal fold; non-blanchable dark area on L hip, L flank, back (not presenting like DTIPI, thought possibly related to poor skin/tissue perfusion and pressor use  Last BM:  7/17 FMS  Height:   Ht Readings from Last 1 Encounters:  01/21/23 6' (1.829 m)    Weight:   Wt Readings from Last 1 Encounters:  02/10/23 (!) 170.2 kg    Ideal Body Weight:  80.9 kg  BMI:  Body mass index is 50.89 kg/m.  Estimated Nutritional Needs:   Kcal:  2100-2300 kcals  Protein:  160-190 g  Fluid:  UOP + 1 L    Romelle Starcher MS, RDN, LDN, CNSC Registered Dietitian 3 Clinical Nutrition RD Pager and On-Call Pager Number Located in [place]

## 2023-02-10 NOTE — Plan of Care (Signed)
  Problem: Clinical Measurements: Goal: Respiratory complications will improve Outcome: Progressing   Problem: Clinical Measurements: Goal: Cardiovascular complication will be avoided Outcome: Not Progressing   Problem: Clinical Measurements: Goal: Diagnostic test results will improve Outcome: Progressing   Problem: Safety: Goal: Ability to remain free from injury will improve Outcome: Not Progressing   Problem: Metabolic: Goal: Ability to maintain appropriate glucose levels will improve Outcome: Progressing

## 2023-02-10 NOTE — Progress Notes (Signed)
Post filter Ionized Calcium 0.30 Systemic Ionized Calcium 1.14 No change to Citrate or Calcium infusion at this time per protocol.

## 2023-02-10 NOTE — Progress Notes (Signed)
NAME:  Phillip Young, MRN:  [id number], DOB:  1986-05-22, LOS: 20 ADMISSION DATE:  01/21/2023, CONSULTATION DATE:  01/22/23 REFERRING MD:  Danise Edge, CHIEF COMPLAINT:  SOB   History of Present Illness:  37 year old man well known to the PCCM and AHF services who presented to [name] 6/28 with fevers, joint pains, SOB. PMHx significant for ESRD, NICM, refractory AFib, ?amio-induced lung toxicity. Patient reportedly missed HD on Thursday (has a history of leaving HD early). Reportedly ~10kg over dry weight with worsening breathing status, AFib/RVR and borderline BP.  PCCM consulted for ICU admission/further evaluation and management.  Pertinent Medical History:   Past Medical History:  Diagnosis Date   Acute on chronic respiratory failure with hypoxia ([name]) 04/21/2021   Acute on chronic systolic (congestive) heart failure ([name]) 02/26/2020   Amiodarone toxicity    Anemia    Atrial flutter ([name])    Biventricular congestive heart failure ([name])    Chronic hypoxemic respiratory failure ([name])    Class 3 severe obesity due to excess calories with serious comorbidity and body mass index (BMI) of 50.0 to 59.9 in adult [facility]) 02/26/2020   Essential hypertension 02/26/2020   GERD without esophagitis 02/26/2020   Hidradenitis suppurativa 02/26/2020   NICM (nonischemic cardiomyopathy) ([name])    Obesity hypoventilation syndrome ([name])    OSA (obstructive sleep apnea)    PAF (paroxysmal atrial fibrillation) ([name])    Pneumonia    Prediabetes 02/26/2020   Significant Hospital Events: Including procedures, antibiotic start and stop dates in addition to other pertinent events   6/28 Admit, emergent HD 6/29 Afib/RVR, shock, ICU transfer 6/30 Intubated and placed on vasopressor support and started on CRRT 7/1 Remains intubated on CRRT continue with pressor support 7/2 Agitated with SAT, remains on CRRT and low dose levophed 7/3 low dose NE, ongoing CRRT 7/4 Attempted SBT this am, apneic but severely agitated,  desaturated, HR up 130/140s.  Re-sedated but HR remains 140's, NE up from 6 to . NSR on EKG.  Was pulling 50-100on UF, since kept even for now.  CVP 8 7/5 DCCV again attempted this a.m. with success, remains on norepinephrine and vasopressin.  7/6 went into A-fib with RVR, attempted DCCV x 3, started on amiodarone infusion after amiodarone bolus 7/7 Multiple DCCV attempts without success. TF held due to vomiting. Tx denied to [facility]  7/8 KUB looked OK. No further vomiting. Tolerating TF. Large BM overnight.  7/9 vasopressors back up, remains rapid afib/ flutter, Bmx 1, wua causing hemodynamic instability 7/10 weaned sedation on mothers request, HR remains 140-220s, tmax 100.6, DNR, no escalation  Interim History / Subjective:  Heavier secretion burden today Pressor needs a little better  Objective:  Blood pressure (!) 149/135, pulse 97, temperature 97.6 F (36.4 C), temperature source Oral, resp. rate (!) 28, height 6' (1.829 m), weight (!) 170.2 kg, SpO2 98%. CVP:  [4 mmHg-17 mmHg] 9 mmHg  Vent Mode: PRVC FiO2 (%):  [40 %] 40 % Set Rate:  [24 bmp-274 bmp] 274 bmp Vt Set:  [620 mL] 620 mL PEEP:  [5 cmH20] 5 cmH20 Plateau Pressure:  [21 cmH20-23 cmH20] 22 cmH20   Intake/Output Summary (Last 24 hours) at 02/10/2023 0847 Last data filed at 02/10/2023 0800 Gross per 24 hour  Intake 3950.8 ml  Output 5561.3 ml  Net -1610.5 ml   Filed Weights   02/08/23 0500 02/09/23 0400 02/10/23 0500  Weight: (!) 174.5 kg (!) 172.9 kg (!) 170.2 kg   Physical Examination: On vent Coughing Copious secretions Ext warm  Localized Sometimes follows commands  Chemistries look okay CBC stable, WBC improved Sed rate dropping  Resolved Hospital Problem List:  Hematuria due to traumatic Foley insertion  Assessment & Plan:  Acute-on-chronic hypoxemic respiratory failure Bilateral multifocal pneumonia- initially strep, question if superimposed HCAP; improvement in WBC with broad spectrum abx argues  for the HCAP Pulmonary edema due to noncompliance with hemodialysis/AonC HF; stable OHS/OSA, noncompliant with CPAP Acute on chronic HFrEF Paroxysmal rapid A-fib/flutter with RVR- see previous notes, refractory, some question of previous amio lung toxicity (05/2022): reviewed the imaging, nodular changes were fairly minimal, I think benefit outweighs risks at this point: started 7/13 Nonischemic cardiomyopathy Ongoing shock state- sepsis being treated, WBC coming down, fever curve improving, underlying shock may be more renal vasoplegia so think we can be more liberal w/ alpha agonism: started 7/13.  [facility] has been contacted: (A) no beds and (B) no additional treatments to offer. End-stage renal disease on hemodialysis Hypervolemic hyponatremia Hyperphosphatemia Hyperkalemia  High anion gap metabolic acidosis Hypercalcemia  - Dc amiodarone 02/08/23. No real benefit from it and history of possible lung tox. Mother wants it off as well.  - Midodrine, levo/vaso for MAP 65; increase to 30 to see if can wean off levophed; tough spot as heart is not the strongest but really don't have any other options - Precedex and PRN fent targeting RASS -1 - SAT/SBT as able: failed this am from WOB - Linezolid/meropenem until today - CRRT for now keep even, ideally will pull if his BP /HR can tolerate - Trend Pct, ESR; both continue to head in right direction - Mother updated at bedside 02/08/23 - Ongoing GOC talks, family is not ready at this time to consider transitioning to a natural death, prognosis remains grim regardless of treatment path - Do not think a good trach candidate until we show he can tolerate iHD; will discuss w/ Dr. Merrily Pew  Best Practice (right click and "Reselect all SmartList Selections" daily)   Diet/type: tube feeds DVT prophylaxis: DOAC GI prophylaxis: Protonix Lines: Yes still needed Foley: n/a Code Status:  Full Last date of multidisciplinary goals of care: 02/08/23; told her high  chance he will die this hospitlization no matter what we do   34 min cc time Myrla Halsted MD PCCM  See Loretha Stapler for personal pager PCCM on call pager [phone] until 7pm. Please call Elink 7p-7a. [phone]  02/10/2023 8:47 AM

## 2023-02-10 NOTE — Progress Notes (Signed)
[facility] Progress Note    Assessment/ Plan:   Sepsis/septic shock: strep pneumoniae grew on initial BCx's (6/28) w/ presumed PNA. Was on rocephin initially then changed to meropenem and linezolid. Still requiring Levo and vaso at high doses.  Also on midodrine AHRF: vent support per PCCM ESRD: Required emergent HD on 6/29 because of fluid overload.  Then developed shock and afib/ RVR requiring high doses of pressors.  Started CRRT on 6/29. CRRT was stopped briefly 7/11 then resumed 7/12. Cont CRRT. UF goals: net even to net neg 50cc/hr as tolerated CRRT anticoagulation - pt required change to citrate protocol for frequent clotting started 7/06.  We cont using citrate protocol due to excessive clotting and intolerance to heparin. There has been a shortage of the dialysate (low bicarb/ zero Ca) designed for citrate protocol intermittently.  Cont to monitor iCal Volume:  c/w UF goal net even to net neg 50cc/hr Acute on chronic systolic CHF, nonischemic: EF 65-78%.  Anemia of CKD: Hemoglobin at goal.  Received Aranesp on 7/1 and 7/08. Hgb 11.2 Hypercalcemia: due to citrate protocol. Ca++ peaked at `12 and improved now down to 9-10 range. Cont to follow.  A-fib with RVR with hypotension: S/P cardioversion 7/5 -> NSR -> afib RVR 7/6 early AM refractory to [facility] x3 + amiodarone.  Grim prognosis, GOC discussions with family per primary service. Discussed with ICU RN. I saw and evaluated the patient on CRRT.  I reviewed the last 24 hours events.  Adjustments to CRRT prescription are made as needed.   Subjective:   Patient seen and examined on CRRT. No acute events. Remains on levo, vaso. Continues to be in afib w/ RVR Tolerating CRRT. Net neg ~1.7L Total net pos ~16.6L   Objective:   BP (!) 149/135 (BP Location: Left Wrist)   Pulse 97   Temp 97.6 F (36.4 C) (Oral)   Resp (!) 28   Ht 6' (1.829 m)   Wt (!) 170.2 kg   SpO2 98%   BMI 50.89 kg/m   Intake/Output Summary  (Last 24 hours) at 02/10/2023 0826 Last data filed at 02/10/2023 0800 Gross per 24 hour  Intake 3950.8 ml  Output 5561.3 ml  Net -1610.5 ml   Weight change: -2.7 kg  Physical Exam: Gen: ill appearing, intubated CVS: irreg irreg Resp: diminished air entry bibasilar, intubated Abd: obese, soft, nt/nd Ext: no sig edema B/L Les Neuro: awake Dialysis access: RIJ TDC in use  Imaging: DG CHEST PORT 1 VIEW  Result Date: 02/10/2023 CLINICAL DATA:  Shortness of breath EXAM: PORTABLE CHEST 1 VIEW COMPARISON:  Chest radiograph 02/09/2023 FINDINGS: The right IJ vascular catheter is stable terminating in the right atrium. The left IJ vascular catheter terminating in the lower SVC/cavoatrial junction is stable. The endotracheal tube tip terminates approximately 4.7 cm from the carina. The enteric catheter tip is off the field of view. Cardiomegaly is unchanged. The upper mediastinal contours are stable. Vascular congestion and pulmonary interstitial edema are unchanged. There is no new or worsening focal airspace disease. There is no significant pleural effusion, though the left costophrenic angle is excluded from the field of view. There is no pneumothorax There is no acute osseous abnormality. IMPRESSION: Unchanged cardiomegaly and pulmonary interstitial edema. Electronically Signed   By: Lesia Hausen M.D.   On: 02/10/2023 08:02   DG CHEST PORT 1 VIEW  Result Date: 02/09/2023 CLINICAL DATA:  Shortness of breath. EXAM: PORTABLE CHEST 1 VIEW COMPARISON:  02/08/2023 FINDINGS: Endotracheal tube tip is  4 cm above the base of the carina. Right IJ central line tip overlies the right atrium near the junction with the SVC. Left IJ central line tip overlies the SVC/RA junction. The NG tube passes into the stomach although the distal tip position is not included on the film. The cardio pericardial silhouette is enlarged. Vascular congestion with diffuse interstitial opacity consistent with edema. No substantial  pleural effusion. Telemetry leads overlie the chest. IMPRESSION: 1. Support apparatus as described. 2. Vascular congestion with diffuse interstitial opacity consistent with edema. No substantial change. Electronically Signed   By: Kennith Center M.D.   On: 02/09/2023 06:33    Labs: BMET Recent Labs  Lab 02/07/23 0418 02/07/23 0541 02/07/23 1550 02/07/23 1845 02/08/23 0500 02/08/23 0837 02/08/23 1654 02/08/23 1656 02/09/23 0525 02/09/23 0527 02/09/23 1400 02/09/23 1407 02/09/23 1600 02/09/23 1742 02/09/23 1750 02/10/23 0023 02/10/23 0028 02/10/23 0546  NA 137   < > 135   < > 135   < > 134*   < > 135   < > 136 134* 135 137 134* 134* 136 133*  K 4.2   < > 3.8   < > 3.4*   < > 3.8   < > 3.7   < > 3.6 3.6 3.7 3.6 3.9 3.4* 3.4* 3.6  CL 92*   < > 92*   < > 92*   < > 92*   < > 94*   < > 92* 97* 92* 93* 95*  --  93* 89*  CO2 28  --  24  --  25  --  25  --  24  --   --   --  24  --   --   --   --  23  GLUCOSE 125*   < > 146*   < > 157*   < > 183*   < > 178*   < > 250* 191* 204* 268* 227*  --  272* 195*  BUN 48*   < > 42*   < > 44*   < > 45*   < > 54*   < > 49* 51* 54* 48* 55*  --  52* 63*  CREATININE 2.95*   < > 2.87*   < > 2.79*   < > 2.75*   < > 2.80*   < > 2.20* 2.70* 2.85* 2.10* 2.90*  --  1.90* 2.58*  CALCIUM 10.9*  --  10.9*  --  10.8*  --  10.9*  --  11.0*  --   --   --  11.6*  --   --   --   --  11.7*  PHOS 5.0*  --  4.5  --  4.6  --  4.6  --  4.4  --   --   --  4.7*  --   --   --   --  5.1*   < > = values in this interval not displayed.   CBC Recent Labs  Lab 02/05/23 0401 02/05/23 0729 02/07/23 0418 02/07/23 0541 02/08/23 0500 02/08/23 0837 02/09/23 0525 02/09/23 0527 02/09/23 1750 02/10/23 0023 02/10/23 0028 02/10/23 0546  WBC 25.3*   < > 13.2*  --  12.2*  --  12.8*  --   --   --   --  12.6*  NEUTROABS 22.8*  --   --   --   --   --   --   --   --   --   --   --  HGB 11.7*   < > 10.3*   < > 10.4*   < > 11.0*   < > 12.2* 12.6* 15.3 11.2*  HCT 37.0*   < > 33.0*   <  > 33.6*   < > 35.7*   < > 36.0* 37.0* 45.0 36.0*  MCV 103.9*   < > 105.4*  --  105.3*  --  106.3*  --   --   --   --  103.7*  PLT 219   < > 275  --  318  --  359  --   --   --   --  376   < > = values in this interval not displayed.    Medications:     apixaban  5 mg Per Tube BID   Chlorhexidine Gluconate Cloth  6 each Topical Q0600   feeding supplement (PROSource TF20)  60 mL Per Tube Q4H   fentaNYL (SUBLIMAZE) injection  50 mcg Intravenous Once   Gerhardt's butt cream   Topical Daily   hydrOXYzine  10 mg Per Tube TID   insulin aspart  0-9 Units Subcutaneous Q4H   insulin glargine-yfgn  14 Units Subcutaneous Daily   lidocaine  1 patch Transdermal Q24H   melatonin  5 mg Per Tube QHS   midodrine  20 mg Per Tube TID WC   multivitamin  1 tablet Per Tube QHS   nutrition supplement (JUVEN)  1 packet Per Tube BID BM   mouth rinse  15 mL Mouth Rinse Q2H   pantoprazole (PROTONIX) IV  40 mg Intravenous Q24H   sodium chloride flush  10-40 mL Intravenous Q12H      Anthony Sar, MD [facility] 02/10/2023, 8:26 AM

## 2023-02-10 NOTE — Progress Notes (Signed)
Post filter Ionized Calcium 0.30 Systemic Ionized Calcium 1.13 No change to Citrate or Calcium infusion at this time per protocol.

## 2023-02-10 NOTE — Progress Notes (Signed)
Palliative:  Discussed with RN. No family at bedside. Family has not been open to further conversation. Will continue to follow for needs and anticipation of further discussion in the very near future. Noted poor candidate for trach per PCCM note. Palliative available as needed.  No charge  Yong Channel, NP Palliative Medicine Team Pager [phone] (Please see [email or web address] for schedule) Team Phone [phone]

## 2023-02-11 DIAGNOSIS — I4891 Unspecified atrial fibrillation: Secondary | ICD-10-CM | POA: Diagnosis not present

## 2023-02-11 DIAGNOSIS — R570 Cardiogenic shock: Secondary | ICD-10-CM | POA: Diagnosis not present

## 2023-02-11 DIAGNOSIS — J9601 Acute respiratory failure with hypoxia: Secondary | ICD-10-CM | POA: Diagnosis not present

## 2023-02-11 LAB — POCT I-STAT, CHEM 8
BUN: 64 mg/dL — ABNORMAL HIGH (ref 6–20)
BUN: 66 mg/dL — ABNORMAL HIGH (ref 6–20)
BUN: 52 mg/dL — ABNORMAL HIGH (ref 6–20)
BUN: 63 mg/dL — ABNORMAL HIGH (ref 6–20)
BUN: 49 mg/dL — ABNORMAL HIGH (ref 6–20)
Calcium, Ion: 0.35 mmol/L — CL (ref 1.15–1.40)
Calcium, Ion: 1.05 mmol/L — ABNORMAL LOW (ref 1.15–1.40)
Calcium, Ion: 0.35 mmol/L — CL (ref 1.15–1.40)
Calcium, Ion: 1.05 mmol/L — ABNORMAL LOW (ref 1.15–1.40)
Calcium, Ion: 0.37 mmol/L — CL (ref 1.15–1.40)
Chloride: 90 mmol/L — ABNORMAL LOW (ref 98–111)
Chloride: 95 mmol/L — ABNORMAL LOW (ref 98–111)
Chloride: 91 mmol/L — ABNORMAL LOW (ref 98–111)
Chloride: 94 mmol/L — ABNORMAL LOW (ref 98–111)
Chloride: 93 mmol/L — ABNORMAL LOW (ref 98–111)
Creatinine, Ser: 2 mg/dL — ABNORMAL HIGH (ref 0.61–1.24)
Creatinine, Ser: 2.7 mg/dL — ABNORMAL HIGH (ref 0.61–1.24)
Creatinine, Ser: 2.1 mg/dL — ABNORMAL HIGH (ref 0.61–1.24)
Creatinine, Ser: 2.7 mg/dL — ABNORMAL HIGH (ref 0.61–1.24)
Creatinine, Ser: 1.9 mg/dL — ABNORMAL HIGH (ref 0.61–1.24)
Glucose, Bld: 178 mg/dL — ABNORMAL HIGH (ref 70–99)
Glucose, Bld: 219 mg/dL — ABNORMAL HIGH (ref 70–99)
Glucose, Bld: 134 mg/dL — ABNORMAL HIGH (ref 70–99)
Glucose, Bld: 188 mg/dL — ABNORMAL HIGH (ref 70–99)
Glucose, Bld: 213 mg/dL — ABNORMAL HIGH (ref 70–99)
HCT: 35 % — ABNORMAL LOW (ref 39.0–52.0)
HCT: 41 % (ref 39.0–52.0)
HCT: 34 % — ABNORMAL LOW (ref 39.0–52.0)
HCT: 41 % (ref 39.0–52.0)
HCT: 42 % (ref 39.0–52.0)
Hemoglobin: 11.9 g/dL — ABNORMAL LOW (ref 13.0–17.0)
Hemoglobin: 13.9 g/dL (ref 13.0–17.0)
Hemoglobin: 11.6 g/dL — ABNORMAL LOW (ref 13.0–17.0)
Hemoglobin: 13.9 g/dL (ref 13.0–17.0)
Hemoglobin: 14.3 g/dL (ref 13.0–17.0)
Potassium: 3.5 mmol/L (ref 3.5–5.1)
Potassium: 3.6 mmol/L (ref 3.5–5.1)
Potassium: 3.5 mmol/L (ref 3.5–5.1)
Potassium: 3.6 mmol/L (ref 3.5–5.1)
Potassium: 3.6 mmol/L (ref 3.5–5.1)
Sodium: 133 mmol/L — ABNORMAL LOW (ref 135–145)
Sodium: 136 mmol/L (ref 135–145)
Sodium: 134 mmol/L — ABNORMAL LOW (ref 135–145)
Sodium: 137 mmol/L (ref 135–145)
Sodium: 137 mmol/L (ref 135–145)
TCO2: 26 mmol/L (ref 22–32)
TCO2: 26 mmol/L (ref 22–32)
TCO2: 25 mmol/L (ref 22–32)
TCO2: 26 mmol/L (ref 22–32)
TCO2: 28 mmol/L (ref 22–32)

## 2023-02-11 LAB — POCT I-STAT 7, (LYTES, BLD GAS, ICA,H+H)
Acid-Base Excess: 2 mmol/L (ref 0.0–2.0)
Bicarbonate: 28.2 mmol/L — ABNORMAL HIGH (ref 20.0–28.0)
Calcium, Ion: 1.1 mmol/L — ABNORMAL LOW (ref 1.15–1.40)
HCT: 36 % — ABNORMAL LOW (ref 39.0–52.0)
Hemoglobin: 12.2 g/dL — ABNORMAL LOW (ref 13.0–17.0)
O2 Saturation: 98 %
Patient temperature: 37.5
Potassium: 3.7 mmol/L (ref 3.5–5.1)
Sodium: 134 mmol/L — ABNORMAL LOW (ref 135–145)
TCO2: 30 mmol/L (ref 22–32)
pCO2 arterial: 48.7 mmHg — ABNORMAL HIGH (ref 32–48)
pH, Arterial: 7.373 (ref 7.35–7.45)
pO2, Arterial: 122 mmHg — ABNORMAL HIGH (ref 83–108)

## 2023-02-11 LAB — RENAL FUNCTION PANEL
Albumin: 2.7 g/dL — ABNORMAL LOW (ref 3.5–5.0)
Albumin: 2.5 g/dL — ABNORMAL LOW (ref 3.5–5.0)
Anion gap: 18 — ABNORMAL HIGH (ref 5–15)
Anion gap: 15 (ref 5–15)
BUN: 65 mg/dL — ABNORMAL HIGH (ref 6–20)
BUN: 53 mg/dL — ABNORMAL HIGH (ref 6–20)
CO2: 24 mmol/L (ref 22–32)
CO2: 23 mmol/L (ref 22–32)
Calcium: 10.7 mg/dL — ABNORMAL HIGH (ref 8.9–10.3)
Calcium: 10.3 mg/dL (ref 8.9–10.3)
Chloride: 91 mmol/L — ABNORMAL LOW (ref 98–111)
Chloride: 98 mmol/L (ref 98–111)
Creatinine, Ser: 2.63 mg/dL — ABNORMAL HIGH (ref 0.61–1.24)
Creatinine, Ser: 2.34 mg/dL — ABNORMAL HIGH (ref 0.61–1.24)
GFR, Estimated: 31 mL/min — ABNORMAL LOW (ref >60)
GFR, Estimated: 36 mL/min — ABNORMAL LOW (ref >60)
Glucose, Bld: 170 mg/dL — ABNORMAL HIGH (ref 70–99)
Glucose, Bld: 155 mg/dL — ABNORMAL HIGH (ref 70–99)
Phosphorus: 4.1 mg/dL (ref 2.5–4.6)
Phosphorus: 3.8 mg/dL (ref 2.5–4.6)
Potassium: 3.7 mmol/L (ref 3.5–5.1)
Potassium: 3.3 mmol/L — ABNORMAL LOW (ref 3.5–5.1)
Sodium: 133 mmol/L — ABNORMAL LOW (ref 135–145)
Sodium: 136 mmol/L (ref 135–145)

## 2023-02-11 LAB — CALCIUM, IONIZED
Calcium, Ionized, Serum: 4.8 mg/dL (ref 4.5–5.6)
Calcium, Ionized, Serum: 4.9 mg/dL (ref 4.5–5.6)

## 2023-02-11 LAB — GLUCOSE, CAPILLARY
Glucose-Capillary: 149 mg/dL — ABNORMAL HIGH (ref 70–99)
Glucose-Capillary: 140 mg/dL — ABNORMAL HIGH (ref 70–99)
Glucose-Capillary: 155 mg/dL — ABNORMAL HIGH (ref 70–99)
Glucose-Capillary: 162 mg/dL — ABNORMAL HIGH (ref 70–99)
Glucose-Capillary: 198 mg/dL — ABNORMAL HIGH (ref 70–99)

## 2023-02-11 LAB — COOXEMETRY PANEL
Carboxyhemoglobin: 1.9 % — ABNORMAL HIGH (ref 0.5–1.5)
Methemoglobin: <0.7 % (ref 0.0–1.5)
O2 Saturation: 55.4 %
Total hemoglobin: 8.8 g/dL — ABNORMAL LOW (ref 12.0–16.0)

## 2023-02-11 LAB — CULTURE, RESPIRATORY W GRAM STAIN

## 2023-02-11 LAB — MAGNESIUM: Magnesium: 2.2 mg/dL (ref 1.7–2.4)

## 2023-02-11 NOTE — Progress Notes (Addendum)
Palliative:  Discussed with Dr. Merrily Pew. I have stopped by room multiple times this week but no family at bedside. No role for palliative care at this time. Goals of care have remained clear for full code, full scope. We do not anticipate further conversation with family to result in different goals. Recommend ongoing spiritual care support to family as able. Please re-consult when further palliative care support indicated. I will sign off at this time.   No charge  Yong Channel, NP Palliative Medicine Team Pager [phone] (Please see [email or web address] for schedule) Team Phone [phone]

## 2023-02-11 NOTE — Progress Notes (Signed)
NAME:  Phillip Young, MRN:  [id number], DOB:  Feb 04, 1986, LOS: 21 ADMISSION DATE:  01/21/2023, CONSULTATION DATE:  01/22/23 REFERRING MD:  Danise Edge, CHIEF COMPLAINT:  SOB   History of Present Illness:  37 year old man well known to the PCCM and AHF services who presented to [facility] 6/28 with fevers, joint pains, SOB. PMHx significant for ESRD, NICM, refractory AFib, ?amio-induced lung toxicity. Patient reportedly missed HD on Thursday (has a history of leaving HD early). Reportedly ~10kg over dry weight with worsening breathing status, AFib/RVR and borderline BP.  PCCM consulted for ICU admission/further evaluation and management.  Pertinent Medical History:   Past Medical History:  Diagnosis Date   Acute on chronic respiratory failure with hypoxia ([name]) 04/21/2021   Acute on chronic systolic (congestive) heart failure ([name]) 02/26/2020   Amiodarone toxicity    Anemia    Atrial flutter ([name])    Biventricular congestive heart failure ([name])    Chronic hypoxemic respiratory failure ([name])    Class 3 severe obesity due to excess calories with serious comorbidity and body mass index (BMI) of 50.0 to 59.9 in adult [facility]) 02/26/2020   Essential hypertension 02/26/2020   GERD without esophagitis 02/26/2020   Hidradenitis suppurativa 02/26/2020   NICM (nonischemic cardiomyopathy) ([name])    Obesity hypoventilation syndrome ([name])    OSA (obstructive sleep apnea)    PAF (paroxysmal atrial fibrillation) ([name])    Pneumonia    Prediabetes 02/26/2020   Significant Hospital Events: Including procedures, antibiotic start and stop dates in addition to other pertinent events   6/28 Admit, emergent HD 6/29 Afib/RVR, shock, ICU transfer 6/30 Intubated and placed on vasopressor support and started on CRRT 7/1 Remains intubated on CRRT continue with pressor support 7/2 Agitated with SAT, remains on CRRT and low dose levophed 7/3 low dose NE, ongoing CRRT 7/4 Attempted SBT this am, apneic but severely agitated,  desaturated, HR up 130/140s.  Re-sedated but HR remains 140's, NE up from 6 to . NSR on EKG.  Was pulling 50-100on UF, since kept even for now.  CVP 8 7/5 DCCV again attempted this a.m. with success, remains on norepinephrine and vasopressin.  7/6 went into A-fib with RVR, attempted DCCV x 3, started on amiodarone infusion after amiodarone bolus 7/7 Multiple DCCV attempts without success. TF held due to vomiting. Tx denied to [facility]  7/8 KUB looked OK. No further vomiting. Tolerating TF. Large BM overnight.  7/9 vasopressors back up, remains rapid afib/ flutter, Bmx 1, wua causing hemodynamic instability 7/10 weaned sedation on mothers request, HR remains 140-220s, tmax 100.6, DNR, no escalation  Interim History / Subjective:  Continue to require vasopressors with NE/Vaso On precedex HR remain elevated in 120- 130  Objective:  Blood pressure (!) 110/57, pulse (!) 101, temperature 100.3 F (37.9 C), temperature source Axillary, resp. rate (!) 30, height 6' (1.829 m), weight (!) 170.9 kg, SpO2 100%. CVP:  [8 mmHg-19 mmHg] 8 mmHg  Vent Mode: PRVC FiO2 (%):  [40 %] 40 % Set Rate:  [24 bmp] 24 bmp Vt Set:  [620 mL] 620 mL PEEP:  [5 cmH20] 5 cmH20 Plateau Pressure:  [22 cmH20-24 cmH20] 22 cmH20   Intake/Output Summary (Last 24 hours) at 02/11/2023 0933 Last data filed at 02/11/2023 0900 Gross per 24 hour  Intake 4469.1 ml  Output 4577.6 ml  Net -108.5 ml   Filed Weights   02/09/23 0400 02/10/23 0500 02/11/23 0445  Weight: (!) 172.9 kg (!) 170.2 kg (!) 170.9 kg   Physical Examination: General: Crtitically  ill-appearing morbidly obese male, orally intubated HEENT: [place]/AT, eyes anicteric.  ETT and OGT in place Neuro: Eyes open, not following commands Chest: Coarse breath sounds, no wheezes or rhonchi Heart: Irregular tachycardic, no murmurs or gallops Abdomen: Soft, nondistended, bowel sounds present Skin: No rash  Labs and images were reviewed  Resolved Hospital Problem List:   Hematuria due to traumatic Foley insertion  Assessment & Plan:  Acute-on-chronic hypoxemic respiratory failure Bilateral multifocal pneumonia- initially strep, question if superimposed HCAP; improvement in WBC with broad spectrum abx argues for the HCAP Pulmonary edema due to noncompliance with hemodialysis/AonC HF; stable OHS/OSA, noncompliant with CPAP Acute on chronic HFrEF Paroxysmal rapid A-fib/flutter with RVR,  refractory, some question of previous amio lung and liver toxicity (05/2022) Nonischemic cardiomyopathy Ongoing shock state- sepsis being treated,  [facility] has been contacted: (A) no beds and (B) no additional treatments to offer. End-stage renal disease on hemodialysis Hypervolemic hyponatremia Hyperphosphatemia Hyperkalemia  High anion gap metabolic acidosis Hypercalcemia  Amio was discontinued yesterday as there was no real benefit Continue Lung protective ventilation  VAP prevention in place Conitnue Midodrine at 30 tid Titrate vasopressors with MAP goal 65 Continue CRRT with net negative fluid balance Completed antibiotics therapy  Cont Precedex and PRN fent targeting RASS -1 Monitor electrolytes  Ongoing GOC talks, family is not ready at this time to consider transitioning to a natural death, prognosis remains grim regardless of treatment path Do not think a good trach candidate until we show he can tolerate iHD  Best Practice (right click and "Reselect all SmartList Selections" daily)   Diet/type: tube feeds DVT prophylaxis: DOAC GI prophylaxis: Famotidine Lines: Yes still needed Foley: n/a Code Status:  Full Last date of multidisciplinary goals of care: 02/08/23; told her high chance he will die this hospitlization no matter what we do   The patient is critically ill due to Shock/Acute respiratory failure.  Critical care was necessary to treat or prevent imminent or life-threatening deterioration.  Critical care was time spent personally by me on the  following activities: development of treatment plan with patient and/or surrogate as well as nursing, discussions with consultants, evaluation of patient's response to treatment, examination of patient, obtaining history from patient or surrogate, ordering and performing treatments and interventions, ordering and review of laboratory studies, ordering and review of radiographic studies, pulse oximetry, re-evaluation of patient's condition and participation in multidisciplinary rounds.   During this encounter critical care time was devoted to patient care services described in this note for 35 minutes.     Cheri Fowler, MD [facility] See Amion for pager If no response to pager, please call [phone] until 7pm After 7pm, Please call E-link [phone]

## 2023-02-11 NOTE — TOC Progression Note (Signed)
Transition of Care [facility]) - Progression Note    Patient Details  Name: Phillip Young MRN: [id number] Date of Birth: 12-Sep-1985  Transition of [facility] CM/SW Contact  Elliot Cousin, RN Phone Number: [phone] 02/11/2023, 3:50 PM  Clinical Narrative:   TOC CM spoke to pt's mother, she had questions about his IV abx. Message sent to pt's Unit RN. States she plans to come back this evening. She had to rest. Mother hopeful and wanted to continue his care. Will continue to follow for CM needs.     Expected Discharge Plan: Home/Self Care Barriers to Discharge: Continued Medical Work up  Expected Discharge Plan and Services   Discharge Planning Services: CM Consult   Living arrangements for the past 2 months: Single Family Home                                       Social Determinants of Health (SDOH) Interventions SDOH Screenings   Food Insecurity: No Food Insecurity (01/22/2023)  Housing: Low Risk  (01/22/2023)  Transportation Needs: No Transportation Needs (01/22/2023)  Utilities: Not At Risk (01/22/2023)  Alcohol Screen: Low Risk  (09/21/2022)  Depression (PHQ2-9): High Risk (09/21/2022)  Financial Resource Strain: High Risk (09/21/2022)  Physical Activity: Inactive (09/21/2022)  Social Connections: Unknown (09/21/2022)  Stress: No Stress Concern Present (09/21/2022)  Tobacco Use: Medium Risk (01/21/2023)    Readmission Risk Interventions    06/09/2022   12:19 PM 05/12/2021   10:35 AM 10/16/2020    2:10 PM  Readmission Risk Prevention Plan  Transportation Screening Complete Complete Complete  PCP or Specialist Appt within 5-7 Days   Complete  Home Care Screening   Complete  Medication Review (RN CM)   Referral to Pharmacy  Medication Review (RN Care Manager) Complete Complete   PCP or Specialist appointment within 3-5 days of discharge Complete Complete   HRI or Home Care Consult Complete Complete   SW Recovery Care/Counseling Consult Complete Complete    Palliative Care Screening Not Applicable Complete   Skilled Nursing Facility Not Applicable Complete

## 2023-02-11 NOTE — Progress Notes (Signed)
   02/11/23 1143  Spiritual Encounters  Type of Visit Follow up  Reason for visit Routine spiritual support  OnCall Visit No   Chaplain came to the room attempting to connect with [name] mother.  She was not present.  Chaplain remained for a moment to pray with Pt.  Will return again hoping to find mother.

## 2023-02-11 NOTE — Progress Notes (Signed)
Post filer Ionized Calcium 0.35 Systemic Ionized Calcium 1.05 No change to Citrate or Calcium infusion  Next levels to be drawn in 8 hours per protocol.

## 2023-02-11 NOTE — Plan of Care (Signed)
Pt continues to need pressor support and sedation.

## 2023-02-11 NOTE — Progress Notes (Signed)
Post filer Ionized Calcium 0.37 Systemic Ionized Calcium 1.05 No change to Citrate or Calcium infusion  Next levels to be drawn in 8 hours per protocol.

## 2023-02-11 NOTE — Progress Notes (Signed)
[facility] NEPHROLOGY PROGRESS NOTE  Assessment/ Plan:  # Sepsis/septic shock: strep pneumoniae grew on initial BCx's (6/28) w/ presumed PNA. Was on rocephin initially then changed to meropenem and linezolid. Still requiring Levo and vaso at high doses.  Also on midodrine.  # AHRF: vent support per PCCM, on vent.   # ESRD: Required emergent HD on 6/29 because of fluid overload.  Then developed shock and afib/ RVR requiring high doses of pressors.  Started CRRT on 6/29. CRRT was stopped briefly 7/11 then resumed 7/12. Cont CRRT. Fluid overload, increase UF to 100 cc/ hr, need pressors, d/w ICU team.  # CRRT anticoagulation - pt required change to citrate protocol for frequent clotting started 7/06.  We cont using citrate protocol due to excessive clotting and intolerance to heparin. There has been a shortage of the dialysate (low bicarb/ zero Ca) designed for citrate protocol intermittently.  Cont to monitor iCal.  # Acute on chronic systolic CHF, nonischemic: EF 78-29%. UF with dialysis.   # Anemia of CKD: Hemoglobin at goal.  Received Aranesp on 7/1 and 7/08.   #A-fib with RVR with hypotension: S/P cardioversion 7/5 -> NSR -> afib RVR 7/6 early AM refractory to [facility] x3 + amiodarone.   Subjective:  Seen and examined in ICU. CRRT running even due to requirement of pressors. Vol up, increase UF goal. D/w team.  Objective Vital signs in last 24 hours: Vitals:   02/11/23 0745 02/11/23 0800 02/11/23 0804 02/11/23 0815  BP:   (!) 110/57   Pulse: (!) 113 (!) 108 (!) 154 (!) 101  Resp: (!) 30 (!) 28 (!) 31 (!) 30  Temp:      TempSrc:      SpO2: 100% 100% 100% 100%  Weight:      Height:       Weight change: 0.7 kg  Intake/Output Summary (Last 24 hours) at 02/11/2023 1031 Last data filed at 02/11/2023 1000 Gross per 24 hour  Intake 4451.94 ml  Output 4388.6 ml  Net 63.34 ml       Labs: RENAL PANEL Recent Labs  Lab 02/06/23 0447 02/06/23 0617 02/07/23 0418  02/07/23 0541 02/08/23 0500 02/08/23 0837 02/08/23 1654 02/08/23 1656 02/09/23 0525 02/09/23 0527 02/09/23 1600 02/09/23 1742 02/10/23 0546 02/10/23 0810 02/10/23 1619 02/10/23 2335 02/10/23 2342 02/11/23 0541 02/11/23 0549 02/11/23 0819 02/11/23 0824  NA 135   < > 137   < > 135   < > 134*   < > 135   < > 135   < > 133*   < > 134*   < > 135 133* 136 137 134*  K 4.3   < > 4.2   < > 3.4*   < > 3.8   < > 3.7   < > 3.7   < > 3.6   < > 3.6   < > 3.6 3.6 3.5 3.5 3.6  CL 88*   < > 92*   < > 92*   < > 92*   < > 94*   < > 92*   < > 89*   < > 91*  --  91* 95* 90* 91* 94*  CO2 24   < > 28   < > 25  --  25  --  24  --  24  --  23  --  24  --   --   --   --   --   --   GLUCOSE 148*   < >  125*   < > 157*   < > 183*   < > 178*   < > 204*   < > 195*   < > 148*  --  215* 178* 219* 188* 134*  BUN 68*   < > 48*   < > 44*   < > 45*   < > 54*   < > 54*   < > 63*   < > 59*  --  67* 66* 64* 52* 63*  CREATININE 3.50*   < > 2.95*   < > 2.79*   < > 2.75*   < > 2.80*   < > 2.85*   < > 2.58*   < > 2.48*  --  2.00* 2.70* 2.00* 2.10* 2.70*  CALCIUM 10.8*   < > 10.9*   < > 10.8*  --  10.9*  --  11.0*  --  11.6*  --  11.7*  --  11.8*  --   --   --   --   --   --   MG 2.3  --  2.2  --  2.2  --   --   --  2.1  --   --   --  2.0  --   --   --   --   --   --   --   --   PHOS 5.1*   < > 5.0*   < > 4.6  --  4.6  --  4.4  --  4.7*  --  5.1*  --  4.6  --   --   --   --   --   --   ALBUMIN 2.5*   < > 3.1*   < > 2.8*  --  2.8*  --  2.8*  --  2.8*  --  2.9*  --  2.9*  --   --   --   --   --   --    < > = values in this interval not displayed.    Liver Function Tests: Recent Labs  Lab 02/09/23 1600 02/10/23 0546 02/10/23 1619  ALBUMIN 2.8* 2.9* 2.9*   No results for input(s): "LIPASE", "AMYLASE" in the last 168 hours. No results for input(s): "AMMONIA" in the last 168 hours. CBC: Recent Labs    03/26/22 1958 03/27/22 0300 06/04/22 1924 06/05/22 0154 08/09/22 0042 08/10/22 0107 02/09/23 0525 02/09/23 0527  02/10/23 0546 02/10/23 0810 02/10/23 2342 02/11/23 0541 02/11/23 0549 02/11/23 0819 02/11/23 0824  HGB  --    < >  --    < > 6.7*   < > 11.0*   < > 11.2*   < > 14.3 11.9* 13.9 13.9 11.6*  MCV  --    < >  --    < > 106.1*   < > 106.3*  --  103.7*  --   --   --   --   --   --   VITAMINB12 297  --  5,998*  --   --   --   --   --   --   --   --   --   --   --   --   FOLATE  --   --  15.7  --   --   --   --   --   --   --   --   --   --   --   --  FERRITIN 370*  --   --   --  156  --   --   --   --   --   --   --   --   --   --   TIBC 321  --   --   --  330  --   --   --   --   --   --   --   --   --   --   IRON 27*  --   --   --  28*  --   --   --   --   --   --   --   --   --   --    < > = values in this interval not displayed.    Cardiac Enzymes: No results for input(s): "CKTOTAL", "CKMB", "CKMBINDEX", "TROPONINI" in the last 168 hours. CBG: Recent Labs  Lab 02/10/23 1556 02/10/23 1931 02/10/23 2334 02/11/23 0354 02/11/23 0751  GLUCAP 167* 102* 158* 149* 140*    Iron Studies: No results for input(s): "IRON", "TIBC", "TRANSFERRIN", "FERRITIN" in the last 72 hours. Studies/Results: DG CHEST PORT 1 VIEW  Result Date: 02/10/2023 CLINICAL DATA:  Shortness of breath EXAM: PORTABLE CHEST 1 VIEW COMPARISON:  Chest radiograph 02/09/2023 FINDINGS: The right IJ vascular catheter is stable terminating in the right atrium. The left IJ vascular catheter terminating in the lower SVC/cavoatrial junction is stable. The endotracheal tube tip terminates approximately 4.7 cm from the carina. The enteric catheter tip is off the field of view. Cardiomegaly is unchanged. The upper mediastinal contours are stable. Vascular congestion and pulmonary interstitial edema are unchanged. There is no new or worsening focal airspace disease. There is no significant pleural effusion, though the left costophrenic angle is excluded from the field of view. There is no pneumothorax There is no acute osseous abnormality.  IMPRESSION: Unchanged cardiomegaly and pulmonary interstitial edema. Electronically Signed   By: Lesia Hausen M.D.   On: 02/10/2023 08:02    Medications: Infusions:   prismasol BGK 4/2.5 800 mL/hr at 02/11/23 0827    prismasol BGK 4/2.5 500 mL/hr at 02/10/23 0956   sodium chloride Stopped (01/22/23 1859)   anticoagulant sodium citrate     calcium gluconate 20 g in dextrose 5 % 1,000 mL infusion 50 mL/hr at 02/11/23 1000   citrate dextrose 380 mL/hr at 02/11/23 1000   dexmedetomidine (PRECEDEX) IV infusion 1 mcg/kg/hr (02/11/23 1000)   feeding supplement (VITAL 1.5 CAL) 50 mL/hr at 02/11/23 1000   fentaNYL infusion INTRAVENOUS Stopped (02/08/23 1024)   midazolam Stopped (02/08/23 0609)   norepinephrine (LEVOPHED) Adult infusion 16 mcg/min (02/11/23 1000)   prismasol B22GK 4/0 1,200 mL/hr at 02/11/23 0928   vasopressin 0.04 Units/min (02/11/23 1000)    Scheduled Medications:  apixaban  5 mg Per Tube BID   Chlorhexidine Gluconate Cloth  6 each Topical Q0600   famotidine  20 mg Per Tube Daily   feeding supplement (PROSource TF20)  60 mL Per Tube Q4H   fentaNYL (SUBLIMAZE) injection  50 mcg Intravenous Once   fiber supplement (BANATROL TF)  60 mL Per Tube BID   Gerhardt's butt cream   Topical Daily   hydrOXYzine  10 mg Per Tube TID   insulin aspart  0-9 Units Subcutaneous Q4H   insulin glargine-yfgn  14 Units Subcutaneous Daily   lidocaine  1 patch Transdermal Q24H   melatonin  5 mg Per Tube QHS   midodrine  30  mg Oral TID WC   multivitamin  1 tablet Per Tube QHS   nutrition supplement (JUVEN)  1 packet Per Tube BID BM   mouth rinse  15 mL Mouth Rinse Q2H   sodium chloride flush  10-40 mL Intravenous Q12H    have reviewed scheduled and prn medications.  Physical Exam:critically ill looking male, sedated and intubated.  Heart:RRR, s1s2 nl Lungs:coarse breath sound b/l Abdomen:firm and distended+ Extremities:LE edema trace Dialysis Access: RIJ TDC   Johnette Teigen Prasad  Rasa Degrazia 02/11/2023,10:31 AM  LOS: 21 days

## 2023-02-11 NOTE — Progress Notes (Signed)
Dialysate rate increased to 1500 per MD order. 2 RN verified Huntley Dec, charge RN this shift, with Clinical research associate.

## 2023-02-11 NOTE — Plan of Care (Signed)
  Problem: Clinical Measurements: Goal: Respiratory complications will improve Outcome: Not Progressing Goal: Cardiovascular complication will be avoided Outcome: Not Progressing   Problem: Activity: Goal: Risk for activity intolerance will decrease Outcome: Not Progressing   Problem: Nutrition: Goal: Adequate nutrition will be maintained Outcome: Progressing   Problem: Coping: Goal: Level of anxiety will decrease Outcome: Not Progressing   Problem: Skin Integrity: Goal: Risk for impaired skin integrity will decrease Outcome: Not Progressing

## 2023-02-12 ENCOUNTER — Inpatient Hospital Stay (HOSPITAL_COMMUNITY): Payer: BC Managed Care – PPO

## 2023-02-12 DIAGNOSIS — R570 Cardiogenic shock: Secondary | ICD-10-CM | POA: Diagnosis not present

## 2023-02-12 DIAGNOSIS — J9601 Acute respiratory failure with hypoxia: Secondary | ICD-10-CM | POA: Diagnosis not present

## 2023-02-12 DIAGNOSIS — I4891 Unspecified atrial fibrillation: Secondary | ICD-10-CM | POA: Diagnosis not present

## 2023-02-12 DIAGNOSIS — I5022 Chronic systolic (congestive) heart failure: Secondary | ICD-10-CM | POA: Diagnosis not present

## 2023-02-12 LAB — POCT I-STAT 7, (LYTES, BLD GAS, ICA,H+H)
Acid-base deficit: 2 mmol/L (ref 0.0–2.0)
Acid-base deficit: 1 mmol/L (ref 0.0–2.0)
Bicarbonate: 22.6 mmol/L (ref 20.0–28.0)
Bicarbonate: 24.1 mmol/L (ref 20.0–28.0)
Calcium, Ion: 1.05 mmol/L — ABNORMAL LOW (ref 1.15–1.40)
Calcium, Ion: 1.08 mmol/L — ABNORMAL LOW (ref 1.15–1.40)
HCT: 38 % — ABNORMAL LOW (ref 39.0–52.0)
HCT: 37 % — ABNORMAL LOW (ref 39.0–52.0)
Hemoglobin: 12.9 g/dL — ABNORMAL LOW (ref 13.0–17.0)
Hemoglobin: 12.6 g/dL — ABNORMAL LOW (ref 13.0–17.0)
O2 Saturation: 99 %
O2 Saturation: 100 %
Patient temperature: 100.4
Patient temperature: 101
Potassium: 3.8 mmol/L (ref 3.5–5.1)
Potassium: 4.1 mmol/L (ref 3.5–5.1)
Sodium: 136 mmol/L (ref 135–145)
Sodium: 134 mmol/L — ABNORMAL LOW (ref 135–145)
TCO2: 24 mmol/L (ref 22–32)
TCO2: 25 mmol/L (ref 22–32)
pCO2 arterial: 38.1 mmHg (ref 32–48)
pCO2 arterial: 43.1 mmHg (ref 32–48)
pH, Arterial: 7.385 (ref 7.35–7.45)
pH, Arterial: 7.36 (ref 7.35–7.45)
pO2, Arterial: 167 mmHg — ABNORMAL HIGH (ref 83–108)
pO2, Arterial: 180 mmHg — ABNORMAL HIGH (ref 83–108)

## 2023-02-12 LAB — RENAL FUNCTION PANEL
Albumin: 2.9 g/dL — ABNORMAL LOW (ref 3.5–5.0)
Albumin: 2.9 g/dL — ABNORMAL LOW (ref 3.5–5.0)
Anion gap: 16 — ABNORMAL HIGH (ref 5–15)
Anion gap: 25 — ABNORMAL HIGH (ref 5–15)
BUN: 52 mg/dL — ABNORMAL HIGH (ref 6–20)
BUN: 55 mg/dL — ABNORMAL HIGH (ref 6–20)
CO2: 27 mmol/L (ref 22–32)
CO2: 22 mmol/L (ref 22–32)
Calcium: 11.6 mg/dL — ABNORMAL HIGH (ref 8.9–10.3)
Calcium: 11.4 mg/dL — ABNORMAL HIGH (ref 8.9–10.3)
Chloride: 93 mmol/L — ABNORMAL LOW (ref 98–111)
Chloride: 89 mmol/L — ABNORMAL LOW (ref 98–111)
Creatinine, Ser: 2.56 mg/dL — ABNORMAL HIGH (ref 0.61–1.24)
Creatinine, Ser: 2.6 mg/dL — ABNORMAL HIGH (ref 0.61–1.24)
GFR, Estimated: 32 mL/min — ABNORMAL LOW (ref >60)
GFR, Estimated: 32 mL/min — ABNORMAL LOW (ref >60)
Glucose, Bld: 172 mg/dL — ABNORMAL HIGH (ref 70–99)
Glucose, Bld: 152 mg/dL — ABNORMAL HIGH (ref 70–99)
Phosphorus: 4.7 mg/dL — ABNORMAL HIGH (ref 2.5–4.6)
Phosphorus: 4.8 mg/dL — ABNORMAL HIGH (ref 2.5–4.6)
Potassium: 4.1 mmol/L (ref 3.5–5.1)
Potassium: 4.1 mmol/L (ref 3.5–5.1)
Sodium: 136 mmol/L (ref 135–145)
Sodium: 136 mmol/L (ref 135–145)

## 2023-02-12 LAB — POCT I-STAT, CHEM 8
BUN: 43 mg/dL — ABNORMAL HIGH (ref 6–20)
BUN: 45 mg/dL — ABNORMAL HIGH (ref 6–20)
BUN: 47 mg/dL — ABNORMAL HIGH (ref 6–20)
BUN: 49 mg/dL — ABNORMAL HIGH (ref 6–20)
BUN: 51 mg/dL — ABNORMAL HIGH (ref 6–20)
BUN: 52 mg/dL — ABNORMAL HIGH (ref 6–20)
BUN: 55 mg/dL — ABNORMAL HIGH (ref 6–20)
Calcium, Ion: 0.33 mmol/L — CL (ref 1.15–1.40)
Calcium, Ion: 0.33 mmol/L — CL (ref 1.15–1.40)
Calcium, Ion: 0.35 mmol/L — CL (ref 1.15–1.40)
Calcium, Ion: 1.05 mmol/L — ABNORMAL LOW (ref 1.15–1.40)
Calcium, Ion: 1.06 mmol/L — ABNORMAL LOW (ref 1.15–1.40)
Calcium, Ion: 1.07 mmol/L — ABNORMAL LOW (ref 1.15–1.40)
Calcium, Ion: 1.1 mmol/L — ABNORMAL LOW (ref 1.15–1.40)
Chloride: 100 mmol/L (ref 98–111)
Chloride: 93 mmol/L — ABNORMAL LOW (ref 98–111)
Chloride: 93 mmol/L — ABNORMAL LOW (ref 98–111)
Chloride: 96 mmol/L — ABNORMAL LOW (ref 98–111)
Chloride: 96 mmol/L — ABNORMAL LOW (ref 98–111)
Chloride: 96 mmol/L — ABNORMAL LOW (ref 98–111)
Chloride: 99 mmol/L (ref 98–111)
Creatinine, Ser: 1.8 mg/dL — ABNORMAL HIGH (ref 0.61–1.24)
Creatinine, Ser: 1.8 mg/dL — ABNORMAL HIGH (ref 0.61–1.24)
Creatinine, Ser: 1.8 mg/dL — ABNORMAL HIGH (ref 0.61–1.24)
Creatinine, Ser: 2.6 mg/dL — ABNORMAL HIGH (ref 0.61–1.24)
Creatinine, Ser: 2.6 mg/dL — ABNORMAL HIGH (ref 0.61–1.24)
Creatinine, Ser: 2.6 mg/dL — ABNORMAL HIGH (ref 0.61–1.24)
Creatinine, Ser: 2.6 mg/dL — ABNORMAL HIGH (ref 0.61–1.24)
Glucose, Bld: 157 mg/dL — ABNORMAL HIGH (ref 70–99)
Glucose, Bld: 164 mg/dL — ABNORMAL HIGH (ref 70–99)
Glucose, Bld: 169 mg/dL — ABNORMAL HIGH (ref 70–99)
Glucose, Bld: 192 mg/dL — ABNORMAL HIGH (ref 70–99)
Glucose, Bld: 200 mg/dL — ABNORMAL HIGH (ref 70–99)
Glucose, Bld: 212 mg/dL — ABNORMAL HIGH (ref 70–99)
Glucose, Bld: 229 mg/dL — ABNORMAL HIGH (ref 70–99)
HCT: 32 % — ABNORMAL LOW (ref 39.0–52.0)
HCT: 37 % — ABNORMAL LOW (ref 39.0–52.0)
HCT: 37 % — ABNORMAL LOW (ref 39.0–52.0)
HCT: 38 % — ABNORMAL LOW (ref 39.0–52.0)
HCT: 44 % (ref 39.0–52.0)
HCT: 44 % (ref 39.0–52.0)
HCT: 45 % (ref 39.0–52.0)
Hemoglobin: 10.9 g/dL — ABNORMAL LOW (ref 13.0–17.0)
Hemoglobin: 12.6 g/dL — ABNORMAL LOW (ref 13.0–17.0)
Hemoglobin: 12.6 g/dL — ABNORMAL LOW (ref 13.0–17.0)
Hemoglobin: 12.9 g/dL — ABNORMAL LOW (ref 13.0–17.0)
Hemoglobin: 15 g/dL (ref 13.0–17.0)
Hemoglobin: 15 g/dL (ref 13.0–17.0)
Hemoglobin: 15.3 g/dL (ref 13.0–17.0)
Potassium: 3.5 mmol/L (ref 3.5–5.1)
Potassium: 3.8 mmol/L (ref 3.5–5.1)
Potassium: 3.9 mmol/L (ref 3.5–5.1)
Potassium: 4 mmol/L (ref 3.5–5.1)
Potassium: 4 mmol/L (ref 3.5–5.1)
Potassium: 4.1 mmol/L (ref 3.5–5.1)
Potassium: 4.2 mmol/L (ref 3.5–5.1)
Sodium: 134 mmol/L — ABNORMAL LOW (ref 135–145)
Sodium: 135 mmol/L (ref 135–145)
Sodium: 135 mmol/L (ref 135–145)
Sodium: 135 mmol/L (ref 135–145)
Sodium: 136 mmol/L (ref 135–145)
Sodium: 138 mmol/L (ref 135–145)
Sodium: 138 mmol/L (ref 135–145)
TCO2: 21 mmol/L — ABNORMAL LOW (ref 22–32)
TCO2: 24 mmol/L (ref 22–32)
TCO2: 24 mmol/L (ref 22–32)
TCO2: 26 mmol/L (ref 22–32)
TCO2: 26 mmol/L (ref 22–32)
TCO2: 26 mmol/L (ref 22–32)
TCO2: 27 mmol/L (ref 22–32)

## 2023-02-12 LAB — CULTURE, RESPIRATORY W GRAM STAIN

## 2023-02-12 LAB — GLUCOSE, CAPILLARY
Glucose-Capillary: 136 mg/dL — ABNORMAL HIGH (ref 70–99)
Glucose-Capillary: 138 mg/dL — ABNORMAL HIGH (ref 70–99)
Glucose-Capillary: 151 mg/dL — ABNORMAL HIGH (ref 70–99)
Glucose-Capillary: 152 mg/dL — ABNORMAL HIGH (ref 70–99)
Glucose-Capillary: 159 mg/dL — ABNORMAL HIGH (ref 70–99)
Glucose-Capillary: 171 mg/dL — ABNORMAL HIGH (ref 70–99)
Glucose-Capillary: 179 mg/dL — ABNORMAL HIGH (ref 70–99)

## 2023-02-12 LAB — COOXEMETRY PANEL
Carboxyhemoglobin: 1.1 % (ref 0.5–1.5)
Methemoglobin: 1 % (ref 0.0–1.5)
O2 Saturation: 53.9 %
Total hemoglobin: 12.1 g/dL (ref 12.0–16.0)

## 2023-02-12 LAB — CALCIUM, IONIZED: Calcium, Ionized, Serum: 4.6 mg/dL (ref 4.5–5.6)

## 2023-02-12 LAB — MAGNESIUM: Magnesium: 2.2 mg/dL (ref 1.7–2.4)

## 2023-02-12 LAB — PHOSPHORUS: Phosphorus: 4.6 mg/dL (ref 2.5–4.6)

## 2023-02-12 MED ORDER — INSULIN GLARGINE-YFGN 100 UNIT/ML ~~LOC~~ SOLN
16.0000 [IU] | Freq: Every day | SUBCUTANEOUS | Status: DC
  Administered 2023-02-13 – 2023-02-15 (×3): 16 [IU] via SUBCUTANEOUS
  Filled 2023-02-12 (×4): qty 0.16

## 2023-02-12 MED ORDER — PRISMASOL B22GK 4/0 22-4 MEQ/L REPLACEMENT SOLN
Status: DC
  Filled 2023-02-12 (×50): qty 5000

## 2023-02-12 NOTE — Procedures (Signed)
Extubation Procedure Note  Patient Details:   Name: Phillip Young DOB: [birth year] MRN: [id number]   Airway Documentation:    Vent end date: 02/12/23 Vent end time: 1240   Evaluation  O2 sats: stable throughout Complications: No apparent complications Patient did tolerate procedure well. Bilateral Breath Sounds: Diminished   Yes  Pt extubated to 5L HFNC. Pt tolerating well at this time. Cuff leak positive, No stridor noted, RN at bedside, CCM at beside, RT will monitor as needed.   Thornell Mule 02/12/2023, 12:52 PM

## 2023-02-12 NOTE — Progress Notes (Signed)
[facility] NEPHROLOGY PROGRESS NOTE  Assessment/ Plan:  # Sepsis/septic shock: strep pneumoniae grew on initial BCx's (6/28) w/ presumed PNA. Was on rocephin initially then changed to meropenem and linezolid. Still requiring Levo and vaso.  Also on midodrine.  # AHRF: vent support per PCCM, on vent.   # ESRD: Required emergent HD on 6/29 because of fluid overload.  Then developed shock and afib/ RVR requiring high doses of pressors.  Started CRRT on 6/29. CRRT was stopped briefly 7/11 then resumed 7/12. Cont CRRT.  Tolerating UF around 100 cc an hour.  Continue.  Change CRRT due to [place] calcium bath because of hypercalcemia.  # CRRT anticoagulation - pt required change to citrate protocol for frequent clotting started 7/06.  We cont using citrate protocol due to excessive clotting and intolerance to heparin. There has been a shortage of the dialysate (low bicarb/ zero Ca) designed for citrate protocol intermittently.  Cont to monitor iCal.  # Hypercalcemia: Noted patient has hide corrected calcium level however ionized calcium is low.  He is on citrate protocol with calcium gluconate.  Continue to monitor and change to 0 calcium in CRRT bath but replace calcium with calcium gluconate per protocol.  Discussed with the pharmacy, nursing staff.  # Acute on chronic systolic CHF, nonischemic: EF 16-10%. UF with dialysis.   # Anemia of CKD: Hemoglobin at goal.  Received Aranesp on 7/1 and 7/08.   #A-fib with RVR with hypotension: S/P cardioversion 7/5 -> NSR -> afib RVR 7/6 early AM refractory to [facility] x3 + amiodarone.   Overall poor long-term prognosis.  Subjective:  Seen and examined in ICU.  Tolerating 100 cc an hour UF, febrile overnight.  Requiring Levophed and vasopressin.  No significant clinical change.  Discussed with ICU nurse.    Objective Vital signs in last 24 hours: Vitals:   02/12/23 0630 02/12/23 0645 02/12/23 0700 02/12/23 0744  BP:      Pulse: 96 83 (!)  113   Resp: (!) 9  (!) 25   Temp:    98.1 F (36.7 C)  TempSrc:    Axillary  SpO2: 100% 100% 100%   Weight:      Height:       Weight change: -2 kg  Intake/Output Summary (Last 24 hours) at 02/12/2023 0904 Last data filed at 02/12/2023 0854 Gross per 24 hour  Intake 5214.58 ml  Output 7333 ml  Net -2118.42 ml       Labs: RENAL PANEL Recent Labs  Lab 02/08/23 0500 02/08/23 0837 02/09/23 0525 02/09/23 0527 02/10/23 0546 02/10/23 0810 02/10/23 1619 02/10/23 2335 02/11/23 0543 02/11/23 0549 02/11/23 1615 02/12/23 0028 02/12/23 0030 02/12/23 0422 02/12/23 0804 02/12/23 0805  NA 135   < > 135   < > 133*   < > 134*   < > 133*   < > 136 134* 136 136 135 138  K 3.4*   < > 3.7   < > 3.6   < > 3.6   < > 3.7   < > 3.3* 4.2 4.0 4.1 3.9 3.8  CL 92*   < > 94*   < > 89*   < > 91*   < > 91*   < > 98 96* 93* 93* 96* 93*  CO2 25   < > 24   < > 23  --  24  --  24  --  23  --   --  27  --   --  GLUCOSE 157*   < > 178*   < > 195*   < > 148*   < > 170*   < > 155* 192* 229* 172* 169* 212*  BUN 44*   < > 54*   < > 63*   < > 59*   < > 65*   < > 53* 52* 45* 52* 47* 43*  CREATININE 2.79*   < > 2.80*   < > 2.58*   < > 2.48*   < > 2.63*   < > 2.34* 2.60* 1.80* 2.56* 2.60* 1.80*  CALCIUM 10.8*   < > 11.0*   < > 11.7*  --  11.8*  --  10.7*  --  10.3  --   --  11.6*  --   --   MG 2.2  --  2.1  --  2.0  --   --   --  2.2  --   --   --   --  2.2  --   --   PHOS 4.6   < > 4.4   < > 5.1*  --  4.6  --  4.1  --  3.8  --   --  4.6  4.7*  --   --   ALBUMIN 2.8*   < > 2.8*   < > 2.9*  --  2.9*  --  2.7*  --  2.5*  --   --  2.9*  --   --    < > = values in this interval not displayed.    Liver Function Tests: Recent Labs  Lab 02/11/23 0543 02/11/23 1615 02/12/23 0422  ALBUMIN 2.7* 2.5* 2.9*   No results for input(s): "LIPASE", "AMYLASE" in the last 168 hours. No results for input(s): "AMMONIA" in the last 168 hours. CBC: Recent Labs    03/26/22 1958 03/27/22 0300 06/04/22 1924  06/05/22 0154 08/09/22 0042 08/10/22 0107 02/09/23 0525 02/09/23 0527 02/10/23 0546 02/10/23 0810 02/11/23 1609 02/12/23 0028 02/12/23 0030 02/12/23 0804 02/12/23 0805  HGB  --    < >  --    < > 6.7*   < > 11.0*   < > 11.2*   < > 14.3 12.6* 15.0 12.6* 15.3  MCV  --    < >  --    < > 106.1*   < > 106.3*  --  103.7*  --   --   --   --   --   --   VITAMINB12 297  --  5,998*  --   --   --   --   --   --   --   --   --   --   --   --   FOLATE  --   --  15.7  --   --   --   --   --   --   --   --   --   --   --   --   FERRITIN 370*  --   --   --  156  --   --   --   --   --   --   --   --   --   --   TIBC 321  --   --   --  330  --   --   --   --   --   --   --   --   --   --  IRON 27*  --   --   --  28*  --   --   --   --   --   --   --   --   --   --    < > = values in this interval not displayed.    Cardiac Enzymes: No results for input(s): "CKTOTAL", "CKMB", "CKMBINDEX", "TROPONINI" in the last 168 hours. CBG: Recent Labs  Lab 02/11/23 1607 02/11/23 2002 02/12/23 0011 02/12/23 0349 02/12/23 0801  GLUCAP 162* 198* 159* 138* 151*    Iron Studies: No results for input(s): "IRON", "TIBC", "TRANSFERRIN", "FERRITIN" in the last 72 hours. Studies/Results: No results found.  Medications: Infusions:   prismasol BGK 4/2.5 800 mL/hr at 02/12/23 0252   sodium chloride Stopped (01/22/23 1859)   anticoagulant sodium citrate     calcium gluconate 20 g in dextrose 5 % 1,000 mL infusion 50 mL/hr at 02/12/23 0800   citrate dextrose 380 mL/hr at 02/12/23 0800   dexmedetomidine (PRECEDEX) IV infusion 0.9 mcg/kg/hr (02/12/23 0854)   feeding supplement (VITAL 1.5 CAL) 50 mL/hr at 02/12/23 0800   fentaNYL infusion INTRAVENOUS Stopped (02/08/23 1024)   midazolam Stopped (02/08/23 0609)   norepinephrine (LEVOPHED) Adult infusion 19 mcg/min (02/12/23 0800)   prismasol B22GK 4/0 1,500 mL/hr at 02/12/23 0644   vasopressin 0.04 Units/min (02/12/23 0800)    Scheduled Medications:  apixaban   5 mg Per Tube BID   Chlorhexidine Gluconate Cloth  6 each Topical Q0600   famotidine  20 mg Per Tube Daily   feeding supplement (PROSource TF20)  60 mL Per Tube Q4H   fentaNYL (SUBLIMAZE) injection  50 mcg Intravenous Once   fiber supplement (BANATROL TF)  60 mL Per Tube BID   Gerhardt's butt cream   Topical Daily   hydrOXYzine  10 mg Per Tube TID   insulin aspart  0-9 Units Subcutaneous Q4H   insulin glargine-yfgn  14 Units Subcutaneous Daily   lidocaine  1 patch Transdermal Q24H   melatonin  5 mg Per Tube QHS   midodrine  30 mg Oral TID WC   multivitamin  1 tablet Per Tube QHS   nutrition supplement (JUVEN)  1 packet Per Tube BID BM   mouth rinse  15 mL Mouth Rinse Q2H   sodium chloride flush  10-40 mL Intravenous Q12H    have reviewed scheduled and prn medications.  Physical Exam:critically ill looking male, sedated and intubated.  Heart:RRR, s1s2 nl Lungs:coarse breath sound b/l Abdomen:firm and distended+ Extremities:LE edema trace Dialysis Access: RIJ TDC, site looks clean.  Robt Okuda Prasad Shylee Durrett 02/12/2023,9:04 AM  LOS: 22 days

## 2023-02-12 NOTE — Progress Notes (Signed)
NAME:  Phillip Young, MRN:  [id number], DOB:  1985-08-05, LOS: 22 ADMISSION DATE:  01/21/2023, CONSULTATION DATE:  01/22/23 REFERRING MD:  Danise Edge, CHIEF COMPLAINT:  SOB   History of Present Illness:  37 year old man well known to the PCCM and AHF services who presented to [facility] 6/28 with fevers, joint pains, SOB. PMHx significant for ESRD, NICM, refractory AFib, ?amio-induced lung toxicity. Patient reportedly missed HD on Thursday (has a history of leaving HD early). Reportedly ~10kg over dry weight with worsening breathing status, AFib/RVR and borderline BP.  PCCM consulted for ICU admission/further evaluation and management.  Pertinent Medical History:   Past Medical History:  Diagnosis Date   Acute on chronic respiratory failure with hypoxia ([name]) 04/21/2021   Acute on chronic systolic (congestive) heart failure ([name]) 02/26/2020   Amiodarone toxicity    Anemia    Atrial flutter ([name])    Biventricular congestive heart failure ([name])    Chronic hypoxemic respiratory failure ([name])    Class 3 severe obesity due to excess calories with serious comorbidity and body mass index (BMI) of 50.0 to 59.9 in adult [facility]) 02/26/2020   Essential hypertension 02/26/2020   GERD without esophagitis 02/26/2020   Hidradenitis suppurativa 02/26/2020   NICM (nonischemic cardiomyopathy) ([name])    Obesity hypoventilation syndrome ([name])    OSA (obstructive sleep apnea)    PAF (paroxysmal atrial fibrillation) ([name])    Pneumonia    Prediabetes 02/26/2020   Significant Hospital Events: Including procedures, antibiotic start and stop dates in addition to other pertinent events   6/28 Admit, emergent HD 6/29 Afib/RVR, shock, ICU transfer 6/30 Intubated and placed on vasopressor support and started on CRRT 7/1 Remains intubated on CRRT continue with pressor support 7/2 Agitated with SAT, remains on CRRT and low dose levophed 7/3 low dose NE, ongoing CRRT 7/4 Attempted SBT this am, apneic but severely agitated,  desaturated, HR up 130/140s.  Re-sedated but HR remains 140's, NE up from 6 to . NSR on EKG.  Was pulling 50-100on UF, since kept even for now.  CVP 8 7/5 DCCV again attempted this a.m. with success, remains on norepinephrine and vasopressin.  7/6 went into A-fib with RVR, attempted DCCV x 3, started on amiodarone infusion after amiodarone bolus 7/7 Multiple DCCV attempts without success. TF held due to vomiting. Tx denied to [facility]  7/8 KUB looked OK. No further vomiting. Tolerating TF. Large BM overnight.  7/9 vasopressors back up, remains rapid afib/ flutter, Bmx 1, wua causing hemodynamic instability 7/10 weaned sedation on mothers request, HR remains 140-220s, tmax 100.6, DNR, no escalation  Interim History / Subjective:  Continue to require vasopressors with NE at 20 mics and vasopressin at 0.04 On precedex HR remain elevated in 120- 170s Started spiking fever with Tmax 101.2  Objective:  Blood pressure (!) 96/48, pulse (!) 107, temperature 98.1 F (36.7 C), temperature source Axillary, resp. rate (!) 31, height 6' (1.829 m), weight (!) 168.9 kg, SpO2 100%. CVP:  [2 mmHg-18 mmHg] 8 mmHg  Vent Mode: PRVC FiO2 (%):  [40 %] 40 % Set Rate:  [24 bmp] 24 bmp Vt Set:  [620 mL] 620 mL PEEP:  [5 cmH20] 5 cmH20 Pressure Support:  [8 cmH20] 8 cmH20 Plateau Pressure:  [17 cmH20-19 cmH20] 17 cmH20   Intake/Output Summary (Last 24 hours) at 02/12/2023 0945 Last data filed at 02/12/2023 0900 Gross per 24 hour  Intake 5382.07 ml  Output 7624 ml  Net -2241.93 ml   Filed Weights   02/10/23 0500 02/11/23  0445 02/12/23 0500  Weight: (!) 170.2 kg (!) 170.9 kg (!) 168.9 kg   Physical Examination: General: Crtitically ill-appearing morbidly obese young male, orally intubated HEENT: [place]/AT, eyes anicteric.  ETT and OGT in place Neuro: Eyes open, intermittently following commands, generalized weak Chest: Coarse breath sounds, no wheezes or rhonchi Heart: Irregular, tachycardic, no murmurs or  gallops Abdomen: Soft, obese, bowel sounds present Skin: No rash  Labs and images were reviewed  Resolved Hospital Problem List:  Hematuria due to traumatic Foley insertion  Assessment & Plan:  Acute-on-chronic hypoxemic respiratory failure Bilateral multifocal pneumonia- initially strep, question if superimposed HCAP; improvement in WBC with broad spectrum abx argues for the HCAP Pneumococcal bacteremia, treated Pulmonary edema due to noncompliance with hemodialysis/AonC HF; stable OHS/OSA, noncompliant with CPAP Acute on chronic HFrEF Paroxysmal rapid A-fib/flutter with RVR,  refractory, some question of previous amio lung and liver toxicity (05/2022) Nonischemic cardiomyopathy Mixed shock septic/cardiogenic Sepsis being treated,  [facility] has been contacted: (A) no beds and (B) no additional treatments to offer. End-stage renal disease on hemodialysis Hypervolemic hyponatremia Hyperphosphatemia Hyperkalemia  High anion gap metabolic acidosis Hypercalcemia  Patient started spiking fever again even though he was on broad-spectrum antibiotic with Zyvox and meropenem for 10 days Antibiotics were stopped yesterday He does not have respiratory secretions Blood and respiratory cultures have been negative He is tolerating spontaneous breathing trial Continue to remain in A-fib with RVR Amio was discontinued yesterday as there was no real benefit Continue Lung protective ventilation  VAP prevention in place Conitnue Midodrine at 30 tid Titrate vasopressors with MAP goal 65, currently on 20 mics of Levophed and vasopressin Continue CRRT with net negative fluid balance Completed antibiotics therapy  Titrate Precedex with RASS goal 0/-1, Precedex may be the reason for spiking fever again Monitor electrolytes    Best Practice (right click and "Reselect all SmartList Selections" daily)   Diet/type: tube feeds DVT prophylaxis: DOAC GI prophylaxis: Famotidine Lines: Yes still  needed Foley: n/a Code Status:  Full Last date of multidisciplinary goals of care: 02/08/23; told her high chance he will die this hospitlization no matter what we do   The patient is critically ill due to Shock/Acute respiratory failure.  Critical care was necessary to treat or prevent imminent or life-threatening deterioration.  Critical care was time spent personally by me on the following activities: development of treatment plan with patient and/or surrogate as well as nursing, discussions with consultants, evaluation of patient's response to treatment, examination of patient, obtaining history from patient or surrogate, ordering and performing treatments and interventions, ordering and review of laboratory studies, ordering and review of radiographic studies, pulse oximetry, re-evaluation of patient's condition and participation in multidisciplinary rounds.   During this encounter critical care time was devoted to patient care services described in this note for 34 minutes.     Cheri Fowler, MD [facility] See Amion for pager If no response to pager, please call [phone] until 7pm After 7pm, Please call E-link [phone]

## 2023-02-12 NOTE — Progress Notes (Signed)
SLP Cancellation Note  Patient Details Name: Phillip Young MRN: [id number] DOB: 06-14-1986   Cancelled treatment:        Swallow evaluation orders received and appreciated.  Spoke with RN. Pt extubated this afternoon following 21 day intubation.  Pt off BiPAP at this time, but with labile vitals (tachypnea, tachycardia).  Will hold PO trials for respiratory status to stabilize.  Will plan for swallow assessment next date if pt is medically appropriate.   Kerrie Pleasure, MA, CCC-SLP Acute Rehabilitation Services Office: [phone] 02/12/2023, 3:08 PM

## 2023-02-12 NOTE — Progress Notes (Signed)
Pt placed on BiPAP due to increased WOB and tachypnea RR38. Pt tolerating well at this time, CCM MD aware, RN at bedside, RN at bedside, RT will monitor as needed.

## 2023-02-12 NOTE — Progress Notes (Signed)
eLink Physician-Brief Progress Note Patient Name: Phillip Young DOB: 1986-01-19 MRN: [id number]   Date of Service  02/12/2023  HPI/Events of Note  Extubated today.  Unable to swallow.  eICU Interventions  Place NGT for meds Address tube feeds in am     Intervention Category Intermediate Interventions: OtherHenry Russel, Demetrius Charity 02/12/2023, 7:51 PM

## 2023-02-13 DIAGNOSIS — I4891 Unspecified atrial fibrillation: Secondary | ICD-10-CM | POA: Diagnosis not present

## 2023-02-13 DIAGNOSIS — J9601 Acute respiratory failure with hypoxia: Secondary | ICD-10-CM | POA: Diagnosis not present

## 2023-02-13 DIAGNOSIS — A419 Sepsis, unspecified organism: Secondary | ICD-10-CM | POA: Diagnosis not present

## 2023-02-13 LAB — POCT I-STAT 7, (LYTES, BLD GAS, ICA,H+H)
Acid-Base Excess: 0 mmol/L (ref 0.0–2.0)
Bicarbonate: 24.9 mmol/L (ref 20.0–28.0)
Calcium, Ion: 1.06 mmol/L — ABNORMAL LOW (ref 1.15–1.40)
HCT: 38 % — ABNORMAL LOW (ref 39.0–52.0)
Hemoglobin: 12.9 g/dL — ABNORMAL LOW (ref 13.0–17.0)
O2 Saturation: 99 %
Patient temperature: 99
Potassium: 4.7 mmol/L (ref 3.5–5.1)
Sodium: 135 mmol/L (ref 135–145)
TCO2: 26 mmol/L (ref 22–32)
pCO2 arterial: 40 mmHg (ref 32–48)
pH, Arterial: 7.403 (ref 7.35–7.45)
pO2, Arterial: 128 mmHg — ABNORMAL HIGH (ref 83–108)

## 2023-02-13 LAB — COOXEMETRY PANEL
Carboxyhemoglobin: 1.2 % (ref 0.5–1.5)
Methemoglobin: 1.4 % (ref 0.0–1.5)
O2 Saturation: 48.4 %
Total hemoglobin: 11.9 g/dL — ABNORMAL LOW (ref 12.0–16.0)

## 2023-02-13 LAB — POCT I-STAT, CHEM 8
BUN: 37 mg/dL — ABNORMAL HIGH (ref 6–20)
BUN: 37 mg/dL — ABNORMAL HIGH (ref 6–20)
BUN: 40 mg/dL — ABNORMAL HIGH (ref 6–20)
BUN: 40 mg/dL — ABNORMAL HIGH (ref 6–20)
BUN: 42 mg/dL — ABNORMAL HIGH (ref 6–20)
BUN: 43 mg/dL — ABNORMAL HIGH (ref 6–20)
BUN: 44 mg/dL — ABNORMAL HIGH (ref 6–20)
BUN: 46 mg/dL — ABNORMAL HIGH (ref 6–20)
BUN: 46 mg/dL — ABNORMAL HIGH (ref 6–20)
BUN: 55 mg/dL — ABNORMAL HIGH (ref 6–20)
Calcium, Ion: 0.31 mmol/L — CL (ref 1.15–1.40)
Calcium, Ion: 0.43 mmol/L — CL (ref 1.15–1.40)
Calcium, Ion: 1 mmol/L — ABNORMAL LOW (ref 1.15–1.40)
Calcium, Ion: 1.1 mmol/L — ABNORMAL LOW (ref 1.15–1.40)
Calcium, Ion: 1.11 mmol/L — ABNORMAL LOW (ref 1.15–1.40)
Calcium, Ion: 1.17 mmol/L (ref 1.15–1.40)
Calcium, Ion: <0.3 mmol/L — CL (ref 1.15–1.40)
Calcium, Ion: <0.3 mmol/L — CL (ref 1.15–1.40)
Calcium, Ion: <0.3 mmol/L — CL (ref 1.15–1.40)
Calcium, Ion: <0.3 mmol/L — CL (ref 1.15–1.40)
Chloride: 100 mmol/L (ref 98–111)
Chloride: 102 mmol/L (ref 98–111)
Chloride: 103 mmol/L (ref 98–111)
Chloride: 105 mmol/L (ref 98–111)
Chloride: 95 mmol/L — ABNORMAL LOW (ref 98–111)
Chloride: 95 mmol/L — ABNORMAL LOW (ref 98–111)
Chloride: 96 mmol/L — ABNORMAL LOW (ref 98–111)
Chloride: 97 mmol/L — ABNORMAL LOW (ref 98–111)
Chloride: 97 mmol/L — ABNORMAL LOW (ref 98–111)
Chloride: 99 mmol/L (ref 98–111)
Creatinine, Ser: 1.7 mg/dL — ABNORMAL HIGH (ref 0.61–1.24)
Creatinine, Ser: 1.7 mg/dL — ABNORMAL HIGH (ref 0.61–1.24)
Creatinine, Ser: 1.8 mg/dL — ABNORMAL HIGH (ref 0.61–1.24)
Creatinine, Ser: 1.9 mg/dL — ABNORMAL HIGH (ref 0.61–1.24)
Creatinine, Ser: 1.9 mg/dL — ABNORMAL HIGH (ref 0.61–1.24)
Creatinine, Ser: 2 mg/dL — ABNORMAL HIGH (ref 0.61–1.24)
Creatinine, Ser: 2.2 mg/dL — ABNORMAL HIGH (ref 0.61–1.24)
Creatinine, Ser: 2.6 mg/dL — ABNORMAL HIGH (ref 0.61–1.24)
Creatinine, Ser: 2.6 mg/dL — ABNORMAL HIGH (ref 0.61–1.24)
Creatinine, Ser: 2.8 mg/dL — ABNORMAL HIGH (ref 0.61–1.24)
Glucose, Bld: 162 mg/dL — ABNORMAL HIGH (ref 70–99)
Glucose, Bld: 167 mg/dL — ABNORMAL HIGH (ref 70–99)
Glucose, Bld: 170 mg/dL — ABNORMAL HIGH (ref 70–99)
Glucose, Bld: 176 mg/dL — ABNORMAL HIGH (ref 70–99)
Glucose, Bld: 197 mg/dL — ABNORMAL HIGH (ref 70–99)
Glucose, Bld: 221 mg/dL — ABNORMAL HIGH (ref 70–99)
Glucose, Bld: 222 mg/dL — ABNORMAL HIGH (ref 70–99)
Glucose, Bld: 224 mg/dL — ABNORMAL HIGH (ref 70–99)
Glucose, Bld: 224 mg/dL — ABNORMAL HIGH (ref 70–99)
Glucose, Bld: 232 mg/dL — ABNORMAL HIGH (ref 70–99)
HCT: 35 % — ABNORMAL LOW (ref 39.0–52.0)
HCT: 38 % — ABNORMAL LOW (ref 39.0–52.0)
HCT: 39 % (ref 39.0–52.0)
HCT: 40 % (ref 39.0–52.0)
HCT: 42 % (ref 39.0–52.0)
HCT: 44 % (ref 39.0–52.0)
HCT: 45 % (ref 39.0–52.0)
HCT: 46 % (ref 39.0–52.0)
HCT: 46 % (ref 39.0–52.0)
HCT: 46 % (ref 39.0–52.0)
Hemoglobin: 11.9 g/dL — ABNORMAL LOW (ref 13.0–17.0)
Hemoglobin: 12.9 g/dL — ABNORMAL LOW (ref 13.0–17.0)
Hemoglobin: 13.3 g/dL (ref 13.0–17.0)
Hemoglobin: 13.6 g/dL (ref 13.0–17.0)
Hemoglobin: 14.3 g/dL (ref 13.0–17.0)
Hemoglobin: 15 g/dL (ref 13.0–17.0)
Hemoglobin: 15.3 g/dL (ref 13.0–17.0)
Hemoglobin: 15.6 g/dL (ref 13.0–17.0)
Hemoglobin: 15.6 g/dL (ref 13.0–17.0)
Hemoglobin: 15.6 g/dL (ref 13.0–17.0)
Potassium: 4 mmol/L (ref 3.5–5.1)
Potassium: 4.3 mmol/L (ref 3.5–5.1)
Potassium: 4.4 mmol/L (ref 3.5–5.1)
Potassium: 4.4 mmol/L (ref 3.5–5.1)
Potassium: 4.4 mmol/L (ref 3.5–5.1)
Potassium: 4.4 mmol/L (ref 3.5–5.1)
Potassium: 4.5 mmol/L (ref 3.5–5.1)
Potassium: 4.8 mmol/L (ref 3.5–5.1)
Potassium: 5.1 mmol/L (ref 3.5–5.1)
Potassium: 5.4 mmol/L — ABNORMAL HIGH (ref 3.5–5.1)
Sodium: 134 mmol/L — ABNORMAL LOW (ref 135–145)
Sodium: 136 mmol/L (ref 135–145)
Sodium: 137 mmol/L (ref 135–145)
Sodium: 137 mmol/L (ref 135–145)
Sodium: 137 mmol/L (ref 135–145)
Sodium: 138 mmol/L (ref 135–145)
Sodium: 138 mmol/L (ref 135–145)
Sodium: 138 mmol/L (ref 135–145)
Sodium: 138 mmol/L (ref 135–145)
Sodium: 139 mmol/L (ref 135–145)
TCO2: 21 mmol/L — ABNORMAL LOW (ref 22–32)
TCO2: 21 mmol/L — ABNORMAL LOW (ref 22–32)
TCO2: 22 mmol/L (ref 22–32)
TCO2: 23 mmol/L (ref 22–32)
TCO2: 24 mmol/L (ref 22–32)
TCO2: 25 mmol/L (ref 22–32)
TCO2: 25 mmol/L (ref 22–32)
TCO2: 25 mmol/L (ref 22–32)
TCO2: 26 mmol/L (ref 22–32)
TCO2: 27 mmol/L (ref 22–32)

## 2023-02-13 LAB — CBC
HCT: 38.7 % — ABNORMAL LOW (ref 39.0–52.0)
Hemoglobin: 12.1 g/dL — ABNORMAL LOW (ref 13.0–17.0)
MCH: 32.7 pg (ref 26.0–34.0)
MCHC: 31.3 g/dL (ref 30.0–36.0)
MCV: 104.6 fL — ABNORMAL HIGH (ref 80.0–100.0)
Platelets: 439 10*3/uL — ABNORMAL HIGH (ref 150–400)
RBC: 3.7 MIL/uL — ABNORMAL LOW (ref 4.22–5.81)
RDW: 18.4 % — ABNORMAL HIGH (ref 11.5–15.5)
WBC: 15.1 10*3/uL — ABNORMAL HIGH (ref 4.0–10.5)
nRBC: 0 % (ref 0.0–0.2)

## 2023-02-13 LAB — GLUCOSE, CAPILLARY
Glucose-Capillary: 130 mg/dL — ABNORMAL HIGH (ref 70–99)
Glucose-Capillary: 133 mg/dL — ABNORMAL HIGH (ref 70–99)
Glucose-Capillary: 149 mg/dL — ABNORMAL HIGH (ref 70–99)
Glucose-Capillary: 153 mg/dL — ABNORMAL HIGH (ref 70–99)
Glucose-Capillary: 160 mg/dL — ABNORMAL HIGH (ref 70–99)
Glucose-Capillary: 162 mg/dL — ABNORMAL HIGH (ref 70–99)

## 2023-02-13 LAB — RENAL FUNCTION PANEL
Albumin: 3.1 g/dL — ABNORMAL LOW (ref 3.5–5.0)
Albumin: 3.1 g/dL — ABNORMAL LOW (ref 3.5–5.0)
Anion gap: 20 — ABNORMAL HIGH (ref 5–15)
Anion gap: 20 — ABNORMAL HIGH (ref 5–15)
BUN: 47 mg/dL — ABNORMAL HIGH (ref 6–20)
BUN: 49 mg/dL — ABNORMAL HIGH (ref 6–20)
CO2: 23 mmol/L (ref 22–32)
CO2: 18 mmol/L — ABNORMAL LOW (ref 22–32)
Calcium: 11.7 mg/dL — ABNORMAL HIGH (ref 8.9–10.3)
Calcium: 11.2 mg/dL — ABNORMAL HIGH (ref 8.9–10.3)
Chloride: 92 mmol/L — ABNORMAL LOW (ref 98–111)
Chloride: 95 mmol/L — ABNORMAL LOW (ref 98–111)
Creatinine, Ser: 2.48 mg/dL — ABNORMAL HIGH (ref 0.61–1.24)
Creatinine, Ser: 2.43 mg/dL — ABNORMAL HIGH (ref 0.61–1.24)
GFR, Estimated: 33 mL/min — ABNORMAL LOW (ref >60)
GFR, Estimated: 34 mL/min — ABNORMAL LOW (ref >60)
Glucose, Bld: 153 mg/dL — ABNORMAL HIGH (ref 70–99)
Glucose, Bld: 177 mg/dL — ABNORMAL HIGH (ref 70–99)
Phosphorus: 6 mg/dL — ABNORMAL HIGH (ref 2.5–4.6)
Phosphorus: 5.8 mg/dL — ABNORMAL HIGH (ref 2.5–4.6)
Potassium: 4.9 mmol/L (ref 3.5–5.1)
Potassium: 4.8 mmol/L (ref 3.5–5.1)
Sodium: 135 mmol/L (ref 135–145)
Sodium: 133 mmol/L — ABNORMAL LOW (ref 135–145)

## 2023-02-13 LAB — CALCIUM, IONIZED: Calcium, Ionized, Serum: 4.7 mg/dL (ref 4.5–5.6)

## 2023-02-13 LAB — MAGNESIUM: Magnesium: 2.1 mg/dL (ref 1.7–2.4)

## 2023-02-13 NOTE — Progress Notes (Signed)
Previously ordered tube feeds running at trickle rate per Dr Merrily Pew.

## 2023-02-13 NOTE — Progress Notes (Signed)
Post filer Ionized Calcium 0.30 Systemic Ionized Calcium 1.10 No change to Citrate or Calcium infusion  Next levels to be drawn in 8 hours per protocol.

## 2023-02-13 NOTE — Progress Notes (Signed)
Post filer Ionized Calcium 0.30 Systemic Ionized Calcium 1.11 No change to Citrate or Calcium infusion  Next levels to be drawn in 8 hours per protocol.

## 2023-02-13 NOTE — Evaluation (Addendum)
Clinical/Bedside Swallow Evaluation Patient Details  Name: Phillip Young MRN: [id number] Date of Birth: [birth year]  Today's Date: 02/13/2023 Time: SLP Start Time (ACUTE ONLY): 0919 SLP Stop Time (ACUTE ONLY): 0934 SLP Time Calculation (min) (ACUTE ONLY): 15 min  Past Medical History:  Past Medical History:  Diagnosis Date   Acute on chronic respiratory failure with hypoxia ([name]) 04/21/2021   Acute on chronic systolic (congestive) heart failure ([name]) 02/26/2020   Amiodarone toxicity    Anemia    Atrial flutter ([name])    Biventricular congestive heart failure ([name])    Chronic hypoxemic respiratory failure ([name])    Class 3 severe obesity due to excess calories with serious comorbidity and body mass index (BMI) of 50.0 to 59.9 in adult ([name]) 02/26/2020   Essential hypertension 02/26/2020   GERD without esophagitis 02/26/2020   Hidradenitis suppurativa 02/26/2020   NICM (nonischemic cardiomyopathy) ([name])    Obesity hypoventilation syndrome ([name])    OSA (obstructive sleep apnea)    PAF (paroxysmal atrial fibrillation) ([name])    Pneumonia    Prediabetes 02/26/2020   Past Surgical History:  Past Surgical History:  Procedure Laterality Date   ABSCESS DRAINAGE     AV FISTULA PLACEMENT Left 08/21/2021   Procedure: LEFT ARM ARTERIOVENOUS (AV) FISTULA.;  Surgeon: Nada Libman, MD;  Location: [facility] OR;  Service: Vascular;  Laterality: Left;   CARDIAC CATHETERIZATION     CARDIOVERSION N/A 10/09/2021   Procedure: CARDIOVERSION;  Surgeon: Laurey Morale, MD;  Location: [facility] ENDOSCOPY;  Service: Cardiovascular;  Laterality: N/A;   CARDIOVERSION N/A 05/28/2022   Procedure: CARDIOVERSION;  Surgeon: Laurey Morale, MD;  Location: [facility] ENDOSCOPY;  Service: Cardiovascular;  Laterality: N/A;   CARDIOVERSION N/A 06/07/2022   Procedure: CARDIOVERSION;  Surgeon: Laurey Morale, MD;  Location: [facility] ENDOSCOPY;  Service: Cardiovascular;  Laterality: N/A;   IR FLUORO GUIDE CV LINE RIGHT  03/10/2021   IR  FLUORO GUIDE CV LINE RIGHT  04/22/2021   IR FLUORO GUIDE CV LINE RIGHT  08/20/2021   IR US GUIDE VASC ACCESS RIGHT  03/10/2021   IR US GUIDE VASC ACCESS RIGHT  04/22/2021   RIGHT HEART CATH N/A 03/06/2021   Procedure: RIGHT HEART CATH;  Surgeon: Dolores Patty, MD;  Location: [facility] INVASIVE CV LAB;  Service: Cardiovascular;  Laterality: N/A;   RIGHT/LEFT HEART CATH AND CORONARY ANGIOGRAPHY N/A 03/04/2020   Procedure: RIGHT/LEFT HEART CATH AND CORONARY ANGIOGRAPHY;  Surgeon: Laurey Morale, MD;  Location: [facility] INVASIVE CV LAB;  Service: Cardiovascular;  Laterality: N/A;   TEE WITHOUT CARDIOVERSION N/A 05/05/2021   Procedure: TRANSESOPHAGEAL ECHOCARDIOGRAM (TEE);  Surgeon: Laurey Morale, MD;  Location: [facility] ENDOSCOPY;  Service: Cardiovascular;  Laterality: N/A;   TEMPORARY DIALYSIS CATHETER  03/06/2021   Procedure: TEMPORARY DIALYSIS CATHETER;  Surgeon: Dolores Patty, MD;  Location: [facility] INVASIVE CV LAB;  Service: Cardiovascular;;   HPI:  Pt is a 37 year old male who presented with fever, joint pains, SOB. ETT 6/30-7/20. PMH: ESRD, GERD, NICM, refractory AFib, ?amio-induced lung toxicity.    Assessment / Plan / Recommendation  Clinical Impression  Pt was seen for bedside swallow evaluation. [name] RN reported that the pt has been tachypneic today; RR was 22-27 during the evaluation. Oral mechanism exam was suggestive of generalize oral weakness with reduced lingual ROM. [name] natural dentition was reduced. Vocal quality was severely hoarse with reduced vocal intensity; vocal fold insufficiency suspected in the setting of prolonged intubation. Pt presented with symptoms of oropharyngeal dysphagia characterized by reduced bolus  awareness with limited lingual manipulation of boluses, reduced labial stripping/oral closure, and coughing with limited boluses of thin liquids and ice chips. It is recommended that the pt's NPO status be maintained. SLP will follow to assess improvement in swallow  function, but suspects that short-term enteral nutrition may be necessary (e.g., cortrak). SLP Visit Diagnosis: Dysphagia, unspecified (R13.10)    Aspiration Risk  Severe aspiration risk    Diet Recommendation NPO;Alternative means - temporary    Medication Administration: Via alternative means    Other  Recommendations Oral Care Recommendations: Oral care QID    Recommendations for follow up therapy are one component of a multi-disciplinary discharge planning process, led by the attending physician.  Recommendations may be updated based on patient status, additional functional criteria and insurance authorization.  Follow up Recommendations  (TBD)      Assistance Recommended at Discharge    Functional Status Assessment Patient has had a recent decline in their functional status and demonstrates the ability to make significant improvements in function in a reasonable and predictable amount of time.  Frequency and Duration min 2x/week  2 weeks       Prognosis Prognosis for improved oropharyngeal function: Good Barriers to Reach Goals: Severity of deficits      Swallow Study   General Date of Onset: 02/13/23 HPI: Pt is a 37 year old male who presented with fever, joint pains, SOB. ETT 6/30-7/20. PMH: ESRD, GERD, NICM, refractory AFib, ?amio-induced lung toxicity. Type of Study: Bedside Swallow Evaluation Previous Swallow Assessment: none Diet Prior to this Study: NPO;Large bore NG tube Temperature Spikes Noted: No Respiratory Status: Nasal cannula History of Recent Intubation: Yes Total duration of intubation (days): 21 days Date extubated: 02/12/23 Behavior/Cognition: Alert;Cooperative;Confused Oral Cavity Assessment: Within Functional Limits Oral Care Completed by SLP: Yes Oral Cavity - Dentition: Missing dentition Self-Feeding Abilities: Total assist Patient Positioning: Upright in bed;Postural control adequate for testing Baseline Vocal Quality: Hoarse;Low vocal  intensity Volitional Cough: Congested Volitional Swallow: Able to elicit    Oral/Motor/Sensory Function Overall Oral Motor/Sensory Function: Generalized oral weakness Lingual ROM: Reduced right;Reduced left;Suspected CN XII (hypoglossal) dysfunction Lingual Strength: Reduced;Suspected CN XII (hypoglossal) dysfunction   Ice Chips Ice chips: Impaired Presentation: Spoon Oral Phase Impairments: Poor awareness of bolus;Reduced labial seal Oral Phase Functional Implications: Prolonged oral transit   Thin Liquid Thin Liquid: Impaired Presentation: Spoon Oral Phase Impairments: Poor awareness of bolus Oral Phase Functional Implications: Prolonged oral transit    Nectar Thick Nectar Thick Liquid: Not tested   Honey Thick Honey Thick Liquid: Not tested   Puree Puree: Not tested   Solid     Solid: Not tested     Athalee Esterline I. Vear Clock, MS, CCC-SLP Acute Rehabilitation Services Office number [phone]  Scheryl Marten 02/13/2023,10:07 AM

## 2023-02-13 NOTE — Progress Notes (Signed)
[facility] NEPHROLOGY PROGRESS NOTE  Assessment/ Plan:  # Sepsis/septic shock: strep pneumoniae grew on initial BCx's (6/28) w/ presumed PNA. Was on rocephin initially then changed to meropenem and linezolid. Still requiring Levo and vaso.  Also on midodrine.  # AHRF: vent support per PCCM, on vent.   # ESRD: Required emergent HD on 6/29 because of fluid overload.  Then developed shock and afib/ RVR requiring high doses of pressors.  Started CRRT on 6/29. CRRT was stopped briefly 7/11 then resumed 7/12. Cont CRRT.  Tolerating UF around 100 cc an hour.  Changed to 0 calcium bath because of hypercalcemia.  # CRRT anticoagulation - pt required change to citrate protocol for frequent clotting started 7/06.  We cont using citrate protocol due to excessive clotting and intolerance to heparin. There has been a shortage of the dialysate (low bicarb/ zero Ca) designed for citrate protocol intermittently.  Cont to monitor iCal.  # Hypercalcemia: Noted patient has hide corrected calcium level however ionized calcium is low.  He is on citrate protocol with calcium gluconate.  Continue to monitor and changed to 0 calcium in CRRT bath but replace calcium with calcium gluconate per protocol.  Discussed with the pharmacy, nursing staff.  # Acute on chronic systolic CHF, nonischemic: EF 16-10%. UF with dialysis.   # Anemia of CKD: Hemoglobin at goal.  Received Aranesp on 7/1 and 7/08.   #A-fib with RVR with hypotension: S/P cardioversion 7/5 -> NSR -> afib RVR 7/6 early AM refractory to [facility] x3 + amiodarone.   # Acute hypoxic and hypercapnic respiratory failure, patient is now extubated, per PCCM.  Overall poor long-term prognosis.  Subjective:  Seen and examined in ICU.  Patient is now extubated, on high flow oxygen.  Remains tachycardic.  On vasopressin, Levophed and Precedex as well.  Discussed with ICU nurse.   Objective Vital signs in last 24 hours: Vitals:   02/13/23 0715 02/13/23  0730 02/13/23 0745 02/13/23 0800  BP:      Pulse:    (!) 114  Resp: (!) 26 (!) 29 (!) 33 (!) 29  Temp:    99.1 F (37.3 C)  TempSrc:    Axillary  SpO2:    100%  Weight:      Height:       Weight change: -2.7 kg  Intake/Output Summary (Last 24 hours) at 02/13/2023 0938 Last data filed at 02/13/2023 0900 Gross per 24 hour  Intake 2983.95 ml  Output 5331 ml  Net -2347.05 ml       Labs: RENAL PANEL Recent Labs  Lab 02/09/23 0525 02/09/23 0527 02/10/23 0546 02/10/23 0810 02/11/23 0543 02/11/23 0549 02/11/23 1615 02/12/23 0028 02/12/23 0422 02/12/23 0804 02/12/23 1600 02/13/23 0014 02/13/23 0016 02/13/23 0022 02/13/23 0031 02/13/23 0301 02/13/23 0436  NA 135   < > 133*   < > 133*   < > 136   < > 136   < > 136 138 138 138 135 137 135  K 3.7   < > 3.6   < > 3.7   < > 3.3*   < > 4.1   < > 4.1 4.4 4.0 4.3 4.7 4.4 4.9  CL 94*   < > 89*   < > 91*   < > 98   < > 93*   < > 89* 95* 105 96*  --  97* 92*  CO2 24   < > 23   < > 24  --  23  --  27  --  22  --   --   --   --   --  23  GLUCOSE 178*   < > 195*   < > 170*   < > 155*   < > 172*   < > 152* 232* 162* 224*  --  224* 153*  BUN 54*   < > 63*   < > 65*   < > 53*   < > 52*   < > 55* 40* 40* 42*  --  37* 47*  CREATININE 2.80*   < > 2.58*   < > 2.63*   < > 2.34*   < > 2.56*   < > 2.60* 1.80* 2.20* 1.70*  --  1.70* 2.48*  CALCIUM 11.0*   < > 11.7*   < > 10.7*  --  10.3  --  11.6*  --  11.4*  --   --   --   --   --  11.7*  MG 2.1  --  2.0  --  2.2  --   --   --  2.2  --   --   --   --   --   --   --  2.1  PHOS 4.4   < > 5.1*   < > 4.1  --  3.8  --  4.6  4.7*  --  4.8*  --   --   --   --   --  6.0*  ALBUMIN 2.8*   < > 2.9*   < > 2.7*  --  2.5*  --  2.9*  --  2.9*  --   --   --   --   --  3.1*   < > = values in this interval not displayed.    Liver Function Tests: Recent Labs  Lab 02/12/23 0422 02/12/23 1600 02/13/23 0436  ALBUMIN 2.9* 2.9* 3.1*   No results for input(s): "LIPASE", "AMYLASE" in the last 168 hours. No  results for input(s): "AMMONIA" in the last 168 hours. CBC: Recent Labs    03/26/22 1958 03/27/22 0300 06/04/22 1924 06/05/22 0154 08/09/22 0042 08/10/22 0107 02/10/23 0546 02/10/23 0810 02/13/23 0016 02/13/23 0022 02/13/23 0031 02/13/23 0301 02/13/23 0436  HGB  --    < >  --    < > 6.7*   < > 11.2*   < > 11.9* 15.0 12.9* 15.3 12.1*  MCV  --    < >  --    < > 106.1*   < > 103.7*  --   --   --   --   --  104.6*  VITAMINB12 297  --  5,998*  --   --   --   --   --   --   --   --   --   --   FOLATE  --   --  15.7  --   --   --   --   --   --   --   --   --   --   FERRITIN 370*  --   --   --  156  --   --   --   --   --   --   --   --   TIBC 321  --   --   --  330  --   --   --   --   --   --   --   --  IRON 27*  --   --   --  28*  --   --   --   --   --   --   --   --    < > = values in this interval not displayed.    Cardiac Enzymes: No results for input(s): "CKTOTAL", "CKMB", "CKMBINDEX", "TROPONINI" in the last 168 hours. CBG: Recent Labs  Lab 02/12/23 1544 02/12/23 1946 02/12/23 2321 02/13/23 0343 02/13/23 0824  GLUCAP 136* 152* 179* 149* 160*    Iron Studies: No results for input(s): "IRON", "TIBC", "TRANSFERRIN", "FERRITIN" in the last 72 hours. Studies/Results: DG CHEST PORT 1 VIEW  Result Date: 02/12/2023 CLINICAL DATA:  Extubation. EXAM: PORTABLE CHEST 1 VIEW COMPARISON:  Chest radiograph dated 02/10/2023. FINDINGS: Interval removal of the endotracheal tube. Additional support apparatus as seen before. There is stable cardiomegaly. There is vascular congestion. Bibasilar densities may represent atelectasis. Pneumonia is not excluded. Overall slight worsening of aeration of the lungs since the prior radiograph. No acute osseous pathology. IMPRESSION: 1. Interval removal of the endotracheal tube. 2. Slight worsening of aeration of the lungs. Electronically Signed   By: Elgie Collard M.D.   On: 02/12/2023 20:45   DG Abd Portable 1V  Result Date:  02/12/2023 CLINICAL DATA:  Tube placement EXAM: PORTABLE ABDOMEN - 1 VIEW COMPARISON:  Abdominal x-ray 02/07/2023 FINDINGS: Enteric tube tip is at the level of the gastric body. The heart is enlarged. No dilated bowel loops are seen in the upper abdomen. IMPRESSION: Enteric tube tip is at the level of the gastric body. Electronically Signed   By: Darliss Cheney M.D.   On: 02/12/2023 20:44    Medications: Infusions:  sodium chloride Stopped (01/22/23 1859)   anticoagulant sodium citrate     calcium gluconate 20 g in dextrose 5 % 1,000 mL infusion 50 mL/hr at 02/13/23 0900   citrate dextrose 3,000 mL (02/13/23 0427)   dexmedetomidine (PRECEDEX) IV infusion 0.8 mcg/kg/hr (02/13/23 0900)   feeding supplement (VITAL 1.5 CAL) Stopped (02/12/23 1251)   fentaNYL infusion INTRAVENOUS Stopped (02/08/23 1024)   midazolam Stopped (02/08/23 0609)   norepinephrine (LEVOPHED) Adult infusion 20 mcg/min (02/13/23 0900)   prismasol B22GK 4/0 1,500 mL/hr at 02/13/23 0938   prismasol B22GK 4/0 600 mL/hr at 02/13/23 0119   vasopressin 0.04 Units/min (02/13/23 0900)    Scheduled Medications:  apixaban  5 mg Per Tube BID   Chlorhexidine Gluconate Cloth  6 each Topical Q0600   famotidine  20 mg Per Tube Daily   feeding supplement (PROSource TF20)  60 mL Per Tube Q4H   fentaNYL (SUBLIMAZE) injection  50 mcg Intravenous Once   fiber supplement (BANATROL TF)  60 mL Per Tube BID   Gerhardt's butt cream   Topical Daily   hydrOXYzine  10 mg Per Tube TID   insulin aspart  0-9 Units Subcutaneous Q4H   insulin glargine-yfgn  16 Units Subcutaneous Daily   lidocaine  1 patch Transdermal Q24H   melatonin  5 mg Per Tube QHS   midodrine  30 mg Oral TID WC   multivitamin  1 tablet Per Tube QHS   nutrition supplement (JUVEN)  1 packet Per Tube BID BM   sodium chloride flush  10-40 mL Intravenous Q12H    have reviewed scheduled and prn medications.  Physical Exam: Critically ill looking male, looks uncomfortable,  extubated. Heart:RRR, s1s2 nl Lungs:coarse breath sound b/l Abdomen:firm and distended+ Extremities:LE edema trace Dialysis Access: RIJ TDC, site looks clean.  Junaid Wurzer Prasad Allyah [name] 02/13/2023,9:38 AM  LOS: 23 days

## 2023-02-13 NOTE — Progress Notes (Signed)
NAME:  Phillip Young, MRN:  [id number], DOB:  [birth year], LOS: 23 ADMISSION DATE:  01/21/2023, CONSULTATION DATE:  01/22/23 REFERRING MD:  Danise Edge, CHIEF COMPLAINT:  SOB   History of Present Illness:  37 year old man well known to the PCCM and AHF services who presented to [facility] 6/28 with fevers, joint pains, SOB. PMHx significant for ESRD, NICM, refractory AFib, ?amio-induced lung toxicity. Patient reportedly missed HD on Thursday (has a history of leaving HD early). Reportedly ~10kg over dry weight with worsening breathing status, AFib/RVR and borderline BP.  PCCM consulted for ICU admission/further evaluation and management.  Pertinent Medical History:   Past Medical History:  Diagnosis Date   Acute on chronic respiratory failure with hypoxia ([name]) 04/21/2021   Acute on chronic systolic (congestive) heart failure ([name]) 02/26/2020   Amiodarone toxicity    Anemia    Atrial flutter ([name])    Biventricular congestive heart failure ([name])    Chronic hypoxemic respiratory failure ([name])    Class 3 severe obesity due to excess calories with serious comorbidity and body mass index (BMI) of 50.0 to 59.9 in adult [facility]) 02/26/2020   Essential hypertension 02/26/2020   GERD without esophagitis 02/26/2020   Hidradenitis suppurativa 02/26/2020   NICM (nonischemic cardiomyopathy) ([name])    Obesity hypoventilation syndrome ([name])    OSA (obstructive sleep apnea)    PAF (paroxysmal atrial fibrillation) ([name])    Pneumonia    Prediabetes 02/26/2020   Significant Hospital Events: Including procedures, antibiotic start and stop dates in addition to other pertinent events   6/28 Admit, emergent HD 6/29 Afib/RVR, shock, ICU transfer 6/30 Intubated and placed on vasopressor support and started on CRRT 7/1 Remains intubated on CRRT continue with pressor support 7/2 Agitated with SAT, remains on CRRT and low dose levophed 7/3 low dose NE, ongoing CRRT 7/4 Attempted SBT this am, apneic but severely agitated,  desaturated, HR up 130/140s.  Re-sedated but HR remains 140's, NE up from 6 to . NSR on EKG.  Was pulling 50-100on UF, since kept even for now.  CVP 8 7/5 DCCV again attempted this a.m. with success, remains on norepinephrine and vasopressin.  7/6 went into A-fib with RVR, attempted DCCV x 3, started on amiodarone infusion after amiodarone bolus 7/7 Multiple DCCV attempts without success. TF held due to vomiting. Tx denied to [facility]  7/8 KUB looked OK. No further vomiting. Tolerating TF. Large BM overnight.  7/9 vasopressors back up, remains rapid afib/ flutter, Bmx 1, wua causing hemodynamic instability 7/10 weaned sedation on mothers request, HR remains 140-220s, tmax 100.6, DNR, no escalation  Interim History / Subjective:  Continue to require vasopressors with NE at 20 mics and vasopressin at 0.04 not much improvement On low dose precedex He was successfully extubated yesterday, requiring 6 L oxygen HR remain elevated in 120- 170s Remain afebrile in last 24 hours Failed swallow evaluation  Objective:  Blood pressure (!) 96/48, pulse (!) 114, temperature 99.1 F (37.3 C), temperature source Axillary, resp. rate (!) 29, height 6' (1.829 m), weight (!) 166.2 kg, SpO2 100%. CVP:  [6 mmHg-15 mmHg] 9 mmHg  Vent Mode: BIPAP;PSV FiO2 (%):  [40 %] 40 % PEEP:  [5 cmH20] 5 cmH20 Pressure Support:  [2 cmH20] 2 cmH20   Intake/Output Summary (Last 24 hours) at 02/13/2023 1148 Last data filed at 02/13/2023 1114 Gross per 24 hour  Intake 3170.8 ml  Output 5587 ml  Net -2416.2 ml   Filed Weights   02/11/23 0445 02/12/23 0500 02/13/23 0600  Weight: Marland Kitchen)  170.9 kg (!) 168.9 kg (!) 166.2 kg   Physical Examination: General: Acute on chronically ill-appearing morbidly obese male, lying on the bed HEENT: [place]/AT, eyes anicteric.  NGT in place Neuro: Eyes open, intermittently following commands, moving all 4 extremities.  Generalized weak Chest: Reduced air entry at the bases bilaterally, no wheezes  or rhonchi Heart: Irregular, tachycardic Abdomen: Soft, nondistended, bowel sounds present Skin: No rash   Labs and images were reviewed  Resolved Hospital Problem List:  Hematuria due to traumatic Foley insertion  Assessment & Plan:  Acute-on-chronic hypoxemic respiratory failure Bilateral multifocal pneumonia- initially strep, question if superimposed HCAP; improvement in WBC with broad spectrum abx argues for the HCAP Pneumococcal bacteremia, treated Pulmonary edema due to noncompliance with hemodialysis/AonC HF; stable OHS/OSA, noncompliant with CPAP Acute on chronic HFrEF Paroxysmal rapid A-fib/flutter with RVR,  refractory, some question of previous amio lung and liver toxicity (05/2022) Nonischemic cardiomyopathy Mixed shock septic/cardiogenic Sepsis being treated,  [facility] has been contacted: (A) no beds and (B) no additional treatments to offer. End-stage renal disease on hemodialysis Hypervolemic hyponatremia Hyperphosphatemia Hyperkalemia  High anion gap metabolic acidosis Hypercalcemia  Patient became afebrile Completed antibiotic therapy for pneumonia He was successfully extubated yesterday, remains on 6 L nasal cannula oxygen Repeat ABGs looks stable Blood and respiratory cultures have been negative Continue to remain in A-fib with RVR Amio was discontinued as there was no real benefit and with history of lung and liver toxicity Conitnue Midodrine at 30 tid Titrate vasopressors with MAP goal 65, currently on 20 mics of Levophed and vasopressin Continue CRRT with net negative fluid balance, remains net -2 L, weight is down Titrate Precedex with RASS goal 0/-1,  Monitor electrolytes Coox is 48%  Best Practice (right click and "Reselect all SmartList Selections" daily)   Diet/type: Start trickle tube feeds DVT prophylaxis: DOAC GI prophylaxis: Famotidine Lines: Yes still needed Foley: n/a Code Status:  Full Last date of multidisciplinary goals of care:  02/12/23: [name] and sister were updated at bedside  The patient is critically ill due to Shock/Acute respiratory failure.  Critical care was necessary to treat or prevent imminent or life-threatening deterioration.  Critical care was time spent personally by me on the following activities: development of treatment plan with patient and/or surrogate as well as nursing, discussions with consultants, evaluation of patient's response to treatment, examination of patient, obtaining history from patient or surrogate, ordering and performing treatments and interventions, ordering and review of laboratory studies, ordering and review of radiographic studies, pulse oximetry, re-evaluation of patient's condition and participation in multidisciplinary rounds.   During this encounter critical care time was devoted to patient care services described in this note for 33 minutes.     Cheri Fowler, MD [place] [facility] See Amion for pager If no response to pager, please call [phone] until 7pm After 7pm, Please call E-link [phone]

## 2023-02-14 ENCOUNTER — Inpatient Hospital Stay (HOSPITAL_COMMUNITY): Payer: BC Managed Care – PPO

## 2023-02-14 DIAGNOSIS — R570 Cardiogenic shock: Secondary | ICD-10-CM | POA: Diagnosis not present

## 2023-02-14 DIAGNOSIS — I4891 Unspecified atrial fibrillation: Secondary | ICD-10-CM | POA: Diagnosis not present

## 2023-02-14 DIAGNOSIS — J9601 Acute respiratory failure with hypoxia: Secondary | ICD-10-CM | POA: Diagnosis not present

## 2023-02-14 DIAGNOSIS — I5022 Chronic systolic (congestive) heart failure: Secondary | ICD-10-CM | POA: Diagnosis not present

## 2023-02-14 LAB — CBC
HCT: 39.7 % (ref 39.0–52.0)
Hemoglobin: 12.4 g/dL — ABNORMAL LOW (ref 13.0–17.0)
MCH: 32.6 pg (ref 26.0–34.0)
MCHC: 31.2 g/dL (ref 30.0–36.0)
MCV: 104.5 fL — ABNORMAL HIGH (ref 80.0–100.0)
Platelets: 446 10*3/uL — ABNORMAL HIGH (ref 150–400)
RBC: 3.8 MIL/uL — ABNORMAL LOW (ref 4.22–5.81)
RDW: 18.5 % — ABNORMAL HIGH (ref 11.5–15.5)
WBC: 16.8 10*3/uL — ABNORMAL HIGH (ref 4.0–10.5)
nRBC: 0.1 % (ref 0.0–0.2)

## 2023-02-14 LAB — POCT I-STAT, CHEM 8
BUN: 45 mg/dL — ABNORMAL HIGH (ref 6–20)
BUN: 52 mg/dL — ABNORMAL HIGH (ref 6–20)
BUN: 47 mg/dL — ABNORMAL HIGH (ref 6–20)
BUN: 53 mg/dL — ABNORMAL HIGH (ref 6–20)
BUN: 46 mg/dL — ABNORMAL HIGH (ref 6–20)
Calcium, Ion: 0.37 mmol/L — CL (ref 1.15–1.40)
Calcium, Ion: 1.1 mmol/L — ABNORMAL LOW (ref 1.15–1.40)
Calcium, Ion: 0.36 mmol/L — CL (ref 1.15–1.40)
Calcium, Ion: 1.1 mmol/L — ABNORMAL LOW (ref 1.15–1.40)
Calcium, Ion: 0.36 mmol/L — CL (ref 1.15–1.40)
Chloride: 103 mmol/L (ref 98–111)
Chloride: 98 mmol/L (ref 98–111)
Chloride: 106 mmol/L (ref 98–111)
Chloride: 99 mmol/L (ref 98–111)
Chloride: 98 mmol/L (ref 98–111)
Creatinine, Ser: 2 mg/dL — ABNORMAL HIGH (ref 0.61–1.24)
Creatinine, Ser: 3.1 mg/dL — ABNORMAL HIGH (ref 0.61–1.24)
Creatinine, Ser: 2 mg/dL — ABNORMAL HIGH (ref 0.61–1.24)
Creatinine, Ser: 2.9 mg/dL — ABNORMAL HIGH (ref 0.61–1.24)
Creatinine, Ser: 2.1 mg/dL — ABNORMAL HIGH (ref 0.61–1.24)
Glucose, Bld: 160 mg/dL — ABNORMAL HIGH (ref 70–99)
Glucose, Bld: 195 mg/dL — ABNORMAL HIGH (ref 70–99)
Glucose, Bld: 163 mg/dL — ABNORMAL HIGH (ref 70–99)
Glucose, Bld: 206 mg/dL — ABNORMAL HIGH (ref 70–99)
Glucose, Bld: 180 mg/dL — ABNORMAL HIGH (ref 70–99)
HCT: 40 % (ref 39.0–52.0)
HCT: 48 % (ref 39.0–52.0)
HCT: 39 % (ref 39.0–52.0)
HCT: 47 % (ref 39.0–52.0)
HCT: 48 % (ref 39.0–52.0)
Hemoglobin: 13.6 g/dL (ref 13.0–17.0)
Hemoglobin: 16.3 g/dL (ref 13.0–17.0)
Hemoglobin: 13.3 g/dL (ref 13.0–17.0)
Hemoglobin: 16 g/dL (ref 13.0–17.0)
Hemoglobin: 16.3 g/dL (ref 13.0–17.0)
Potassium: 5.1 mmol/L (ref 3.5–5.1)
Potassium: 5.7 mmol/L — ABNORMAL HIGH (ref 3.5–5.1)
Potassium: 5.2 mmol/L — ABNORMAL HIGH (ref 3.5–5.1)
Potassium: 5.4 mmol/L — ABNORMAL HIGH (ref 3.5–5.1)
Potassium: 5 mmol/L (ref 3.5–5.1)
Sodium: 134 mmol/L — ABNORMAL LOW (ref 135–145)
Sodium: 137 mmol/L (ref 135–145)
Sodium: 135 mmol/L (ref 135–145)
Sodium: 138 mmol/L (ref 135–145)
Sodium: 139 mmol/L (ref 135–145)
TCO2: 20 mmol/L — ABNORMAL LOW (ref 22–32)
TCO2: 22 mmol/L (ref 22–32)
TCO2: 18 mmol/L — ABNORMAL LOW (ref 22–32)
TCO2: 23 mmol/L (ref 22–32)
TCO2: 21 mmol/L — ABNORMAL LOW (ref 22–32)

## 2023-02-14 LAB — COOXEMETRY PANEL
Carboxyhemoglobin: 0.7 % (ref 0.5–1.5)
Methemoglobin: 1.1 % (ref 0.0–1.5)
O2 Saturation: 40.3 %
Total hemoglobin: 12.1 g/dL (ref 12.0–16.0)

## 2023-02-14 LAB — RENAL FUNCTION PANEL
Albumin: 3.2 g/dL — ABNORMAL LOW (ref 3.5–5.0)
Albumin: 3 g/dL — ABNORMAL LOW (ref 3.5–5.0)
Anion gap: 20 — ABNORMAL HIGH (ref 5–15)
Anion gap: 23 — ABNORMAL HIGH (ref 5–15)
BUN: 56 mg/dL — ABNORMAL HIGH (ref 6–20)
BUN: 56 mg/dL — ABNORMAL HIGH (ref 6–20)
CO2: 21 mmol/L — ABNORMAL LOW (ref 22–32)
CO2: 16 mmol/L — ABNORMAL LOW (ref 22–32)
Calcium: 11.3 mg/dL — ABNORMAL HIGH (ref 8.9–10.3)
Calcium: 10.8 mg/dL — ABNORMAL HIGH (ref 8.9–10.3)
Chloride: 95 mmol/L — ABNORMAL LOW (ref 98–111)
Chloride: 97 mmol/L — ABNORMAL LOW (ref 98–111)
Creatinine, Ser: 2.82 mg/dL — ABNORMAL HIGH (ref 0.61–1.24)
Creatinine, Ser: 2.72 mg/dL — ABNORMAL HIGH (ref 0.61–1.24)
GFR, Estimated: 29 mL/min — ABNORMAL LOW (ref >60)
GFR, Estimated: 30 mL/min — ABNORMAL LOW (ref >60)
Glucose, Bld: 152 mg/dL — ABNORMAL HIGH (ref 70–99)
Glucose, Bld: 131 mg/dL — ABNORMAL HIGH (ref 70–99)
Phosphorus: 5.5 mg/dL — ABNORMAL HIGH (ref 2.5–4.6)
Phosphorus: 5.9 mg/dL — ABNORMAL HIGH (ref 2.5–4.6)
Potassium: 5.6 mmol/L — ABNORMAL HIGH (ref 3.5–5.1)
Potassium: 5.3 mmol/L — ABNORMAL HIGH (ref 3.5–5.1)
Sodium: 136 mmol/L (ref 135–145)
Sodium: 136 mmol/L (ref 135–145)

## 2023-02-14 LAB — POCT I-STAT 7, (LYTES, BLD GAS, ICA,H+H)
Acid-base deficit: 11 mmol/L — ABNORMAL HIGH (ref 0.0–2.0)
Acid-base deficit: 6 mmol/L — ABNORMAL HIGH (ref 0.0–2.0)
Bicarbonate: 14 mmol/L — ABNORMAL LOW (ref 20.0–28.0)
Bicarbonate: 18.1 mmol/L — ABNORMAL LOW (ref 20.0–28.0)
Calcium, Ion: 1.09 mmol/L — ABNORMAL LOW (ref 1.15–1.40)
Calcium, Ion: 1.07 mmol/L — ABNORMAL LOW (ref 1.15–1.40)
HCT: 42 % (ref 39.0–52.0)
HCT: 41 % (ref 39.0–52.0)
Hemoglobin: 14.3 g/dL (ref 13.0–17.0)
Hemoglobin: 13.9 g/dL (ref 13.0–17.0)
O2 Saturation: 99 %
O2 Saturation: 100 %
Patient temperature: 98
Patient temperature: 102.8
Potassium: 5.8 mmol/L — ABNORMAL HIGH (ref 3.5–5.1)
Potassium: 5.4 mmol/L — ABNORMAL HIGH (ref 3.5–5.1)
Sodium: 133 mmol/L — ABNORMAL LOW (ref 135–145)
Sodium: 135 mmol/L (ref 135–145)
TCO2: 15 mmol/L — ABNORMAL LOW (ref 22–32)
TCO2: 19 mmol/L — ABNORMAL LOW (ref 22–32)
pCO2 arterial: 28.2 mmHg — ABNORMAL LOW (ref 32–48)
pCO2 arterial: 34.4 mmHg (ref 32–48)
pH, Arterial: 7.302 — ABNORMAL LOW (ref 7.35–7.45)
pH, Arterial: 7.339 — ABNORMAL LOW (ref 7.35–7.45)
pO2, Arterial: 163 mmHg — ABNORMAL HIGH (ref 83–108)
pO2, Arterial: 184 mmHg — ABNORMAL HIGH (ref 83–108)

## 2023-02-14 LAB — GLUCOSE, CAPILLARY
Glucose-Capillary: 139 mg/dL — ABNORMAL HIGH (ref 70–99)
Glucose-Capillary: 177 mg/dL — ABNORMAL HIGH (ref 70–99)
Glucose-Capillary: 153 mg/dL — ABNORMAL HIGH (ref 70–99)
Glucose-Capillary: 121 mg/dL — ABNORMAL HIGH (ref 70–99)
Glucose-Capillary: 125 mg/dL — ABNORMAL HIGH (ref 70–99)

## 2023-02-14 LAB — MAGNESIUM: Magnesium: 2 mg/dL (ref 1.7–2.4)

## 2023-02-14 MED ORDER — LORAZEPAM 2 MG/ML IJ SOLN
1.0000 mg | Freq: Once | INTRAMUSCULAR | Status: AC
  Administered 2023-02-14: 1 mg via INTRAVENOUS
  Filled 2023-02-14: qty 1

## 2023-02-14 MED ORDER — LORAZEPAM 2 MG/ML IJ SOLN
1.0000 mg | Freq: Once | INTRAMUSCULAR | Status: AC | PRN
  Administered 2023-02-14: 1 mg via INTRAVENOUS
  Filled 2023-02-14: qty 1

## 2023-02-14 MED ORDER — OLANZAPINE 10 MG PO TABS
10.0000 mg | ORAL_TABLET | Freq: Once | ORAL | Status: AC | PRN
  Administered 2023-02-14: 10 mg via ORAL
  Filled 2023-02-14: qty 1

## 2023-02-14 MED ORDER — FLUCONAZOLE IN SODIUM CHLORIDE 400-0.9 MG/200ML-% IV SOLN
800.0000 mg | INTRAVENOUS | Status: DC
  Administered 2023-02-14: 400 mg via INTRAVENOUS
  Administered 2023-02-15 – 2023-02-16 (×2): 800 mg via INTRAVENOUS
  Filled 2023-02-14 (×3): qty 400

## 2023-02-14 MED ORDER — HALOPERIDOL LACTATE 5 MG/ML IJ SOLN
5.0000 mg | Freq: Four times a day (QID) | INTRAMUSCULAR | Status: AC | PRN
  Administered 2023-02-16 – 2023-02-17 (×3): 5 mg via INTRAVENOUS
  Filled 2023-02-14 (×4): qty 1

## 2023-02-14 MED ORDER — SODIUM BICARBONATE 8.4 % IV SOLN
INTRAVENOUS | Status: AC
  Administered 2023-02-14: 100 meq via INTRAVENOUS
  Filled 2023-02-14: qty 100

## 2023-02-14 MED ORDER — FENTANYL CITRATE PF 50 MCG/ML IJ SOSY
50.0000 ug | PREFILLED_SYRINGE | INTRAMUSCULAR | Status: DC | PRN
  Administered 2023-02-14 – 2023-02-23 (×39): 50 ug via INTRAVENOUS
  Filled 2023-02-14 (×40): qty 1

## 2023-02-14 MED ORDER — SODIUM BICARBONATE 8.4 % IV SOLN: 100.0000 meq | Freq: Once | INTRAVENOUS | Status: AC

## 2023-02-14 NOTE — Consult Note (Addendum)
WOC Nurse Consult Note: Consult requested for "bilat legs."  WOC consult was already performed on 7/16 for bilat lower posterior thigh wounds which are red and macerated and weeping, according to the photoI reviewed in the EMR; refer to previous consult note. Pt is critically ill with multiple systemic factors which can impair healing.   Topical treatment orders have been provided for bedside nurses to perform to protect and promote healing at that time. I did not reassess posterior thighs/buttocks. I assessed bilat lower legs and there are no wounds or drainage to these locartions.  Continue with present plan of care: Pt is on a low airloss mattress to reduce pressure and increase airflow. Silicone foam dressings to the left hip, bilat posterior thighs, flank,change every 3 days. ASSESS UNDER dressings each shift for any acute changes in the wounds. Please re-consult if further assistance is needed.  Thank-you,  Cammie Mcgee MSN, RN, CWOCN, [place], CNS [phone]

## 2023-02-14 NOTE — Progress Notes (Signed)
Precedex rate reduced per MD request pt more responsive and becoming more agitated, HR 180's-190's increasing pressor requirements Dr Merrily Pew aware.

## 2023-02-14 NOTE — Progress Notes (Signed)
NAME:  Phillip Young, MRN:  [id number], DOB:  1985-12-06, LOS: 24 ADMISSION DATE:  01/21/2023, CONSULTATION DATE:  01/22/23 REFERRING MD:  Phillip Young, CHIEF COMPLAINT:  SOB   History of Present Illness:  37 year old man well known to the PCCM and AHF services who presented to [facility] 6/28 with fevers, joint pains, SOB. PMHx significant for ESRD, NICM, refractory AFib, ?amio-induced lung toxicity. Patient reportedly missed HD on Thursday (has a history of leaving HD early). Reportedly ~10kg over dry weight with worsening breathing status, AFib/RVR and borderline BP.  PCCM consulted for ICU admission/further evaluation and management.  Pertinent Medical History:   Past Medical History:  Diagnosis Date   Acute on chronic respiratory failure with hypoxia ([name]) 04/21/2021   Acute on chronic systolic (congestive) heart failure ([name]) 02/26/2020   Amiodarone toxicity    Anemia    Atrial flutter ([name])    Biventricular congestive heart failure ([name])    Chronic hypoxemic respiratory failure ([name])    Class 3 severe obesity due to excess calories with serious comorbidity and body mass index (BMI) of 50.0 to 59.9 in adult [facility]) 02/26/2020   Essential hypertension 02/26/2020   GERD without esophagitis 02/26/2020   Hidradenitis suppurativa 02/26/2020   NICM (nonischemic cardiomyopathy) ([name])    Obesity hypoventilation syndrome ([name])    OSA (obstructive sleep apnea)    PAF (paroxysmal atrial fibrillation) ([name])    Pneumonia    Prediabetes 02/26/2020   Significant Hospital Events: Including procedures, antibiotic start and stop dates in addition to other pertinent events   6/28 Admit, emergent HD 6/29 Afib/RVR, shock, ICU transfer 6/30 Intubated and placed on vasopressor support and started on CRRT 7/1 Remains intubated on CRRT continue with pressor support 7/2 Agitated with SAT, remains on CRRT and low dose levophed 7/3 low dose NE, ongoing CRRT 7/4 Attempted SBT this am, apneic but severely agitated,  desaturated, HR up 130/140s.  Re-sedated but HR remains 140's, NE up from 6 to . NSR on EKG.  Was pulling 50-100on UF, since kept even for now.  CVP 8 7/5 DCCV again attempted this a.m. with success, remains on norepinephrine and vasopressin.  7/6 went into A-fib with RVR, attempted DCCV x 3, started on amiodarone infusion after amiodarone bolus 7/7 Multiple DCCV attempts without success. TF held due to vomiting. Tx denied to [facility]  7/8 KUB looked OK. No further vomiting. Tolerating TF. Large BM overnight.  7/9 vasopressors back up, remains rapid afib/ flutter, Bmx 1, wua causing hemodynamic instability 7/10 weaned sedation on mothers request, HR remains 140-220s, tmax 100.6, DNR, no escalation  Interim History / Subjective:  Patient was very agitated and restless overnight, requiring increasing Precedex to 1.5 mics per KG Continues to require high amount of vasopressor support, currently on 27 mics of Levophed and 0.04 of vasopressin Remain on 6 L nasal cannula oxygen Heart rate is still not getting corrected, currently running in the 170s  Spiked fever with Tmax 101  Objective:  Blood pressure (!) 96/48, pulse 67, temperature 99.1 F (37.3 C), temperature source Axillary, resp. rate (!) 37, height 6' (1.829 m), weight (!) 164.9 kg, SpO2 100%. CVP:  [9 mmHg-89 mmHg] 9 mmHg      Intake/Output Summary (Last 24 hours) at 02/14/2023 0943 Last data filed at 02/14/2023 0900 Gross per 24 hour  Intake 3526.3 ml  Output 5768 ml  Net -2241.7 ml   Filed Weights   02/12/23 0500 02/13/23 0600 02/14/23 0100  Weight: (!) 168.9 kg (!) 166.2 kg (!) 164.9  kg   Physical Examination: General: Acute on chronically ill-appearing morbidly obese young male, lying on the bed HEENT: [name]/AT, eyes anicteric.  moist mucus membranes Neuro: Lethargic, eyes open, tracking examiner, not following commands, generalized weak Chest: Coarse breath sounds, no wheezes or rhonchi Heart: Irregular,  tachycardic Abdomen: Soft, nontender, nondistended, bowel sounds present Skin: No rash  Labs and images were reviewed  Resolved Hospital Problem List:  Hematuria due to traumatic Foley insertion  Assessment & Plan:  Acute-on-chronic hypoxemic respiratory failure Bilateral multifocal pneumonia- initially strep, question if superimposed HCAP; improvement in WBC with broad spectrum abx argues for the HCAP Pneumococcal bacteremia, treated Pulmonary edema due to noncompliance with hemodialysis/AonC HF; stable OHS/OSA, noncompliant with CPAP Acute on chronic HFrEF Paroxysmal rapid A-fib/flutter with RVR,  refractory, some question of previous amio lung and liver toxicity (05/2022) Nonischemic cardiomyopathy Mixed shock septic/cardiogenic Sepsis being treated,  [facility] has been contacted: (A) no beds and (B) no additional treatments to offer. End-stage renal disease on hemodialysis Hypervolemic hyponatremia Hyperphosphatemia Hyperkalemia  High anion gap metabolic acidosis Hypercalcemia  Patient intermittently spikes fever, now he is spiking fever with Tmax of 101 Respiratory culture is growing yeast He completed antibiotic therapy for 10 days with Zyvox and meropenem Will add fluconazole to see if that helps Continue to remain in A-fib with RVR with heart rate in 170s, amiodarone did not help, he had cardioversion done multiple times, without much help Conitnue Midodrine at 30 tid Regarding increasing vasopressors, currently on Levophed of 27 mics and vasopressin at 0.04 Spoke with nephrology, will continue with CRRT, with net even balance Titrate Precedex with RASS goal 0/-1,  Monitor electrolytes Coox is 40%  Best Practice (right click and "Reselect all SmartList Selections" daily)   Diet/type: Start trickle tube feeds DVT prophylaxis: DOAC GI prophylaxis: Famotidine Lines: Yes still needed Foley: n/a Code Status:  Full Last date of multidisciplinary goals of care: 02/12/23:  [name] and sister were updated at bedside  The patient is critically ill due to Shock/Acute respiratory failure.  Critical care was necessary to treat or prevent imminent or life-threatening deterioration.  Critical care was time spent personally by me on the following activities: development of treatment plan with patient and/or surrogate as well as nursing, discussions with consultants, evaluation of patient's response to treatment, examination of patient, obtaining history from patient or surrogate, ordering and performing treatments and interventions, ordering and review of laboratory studies, ordering and review of radiographic studies, pulse oximetry, re-evaluation of patient's condition and participation in multidisciplinary rounds.   During this encounter critical care time was devoted to patient care services described in this note for 35 minutes.     Cheri Fowler, MD  Pulmonary Critical Care See Amion for pager If no response to pager, please call [phone] until 7pm After 7pm, Please call E-link [phone]

## 2023-02-14 NOTE — Progress Notes (Signed)
Nutrition Follow-up  DOCUMENTATION CODES:   Morbid obesity  INTERVENTION:   Tube Feeding via Cortrak: recommend increasing back to goal rate Vital 1.5 at 50 ml/hr Increase Pro-Source TF20 60 mL to every 4 hours TF regimen at goal provides 2280 kcals, 201 g of protein and 1003 mL of free watr   Pt receiving additional calories in form of dextrose via calcium dextrose (ACD-A anticoagulant) for CRRT   Continue Renal MVI daily   Juven BID, each packet provides 80 calories, 8 grams of carbohydrate, 2.5  grams of protein (collagen), 7 grams of L-arginine and 7 grams of L-glutamine; supplement contains CaHMB, Vitamins C, E, B12 and Zinc to promote wound healing   Add Banatrol TF BID via tube- each packet provides  5g soluble fiber   NUTRITION DIAGNOSIS:   Inadequate oral intake related to inability to eat as evidenced by NPO status.  Being addressed via TF  GOAL:   Patient will meet greater than or equal to 90% of their needs  Progressing  MONITOR:   Vent status, TF tolerance, Labs, Weight trends  REASON FOR ASSESSMENT:   Ventilator, Consult Assessment of nutrition requirement/status  ASSESSMENT:   37 year old male with PMHx of ESRD on HD, nonischemic cardiomyopathy, refractory A-fib, possible amio lung toxicity admitted with acute on chronic hypoxmic respiratory failure in setting of virus and missed HD, probable sepsis.  7/20 Extubated, OG removed, NG placed  Agitated and restless overnight, currently on precedex, Tmax 101 Remains on levophed 27, vasopressin 0.04  Vital at 20 ml/hr via NG, plan for Cortrak today  Hypercalcemia due to citrate protocol, 0 calcium bath via CRRT. UF 100 ml/hr  +stool via FMS  Labs:  sodium 133 (L), potassium 5.8 (H), phosphorus 5.5 (H) Meds: ss novolog, semglee, miralax, renal MVI, senna  Diet Order:   Diet Order     None       EDUCATION NEEDS:   No education needs have been identified at this time  Skin:  Skin  Assessment: Skin Integrity Issues: Skin Integrity Issues:: Stage II, Other (Comment) Stage II: coccyx Other: MASD to buttocks with fissure in gluteal fold; non-blanchable dark area on L hip, L flank, back (not presenting like DTIPI, thought possibly related to poor skin/tissue perfusion and pressor use  Last BM:  7/22 FMS  Height:   Ht Readings from Last 1 Encounters:  01/21/23 6' (1.829 m)    Weight:   Wt Readings from Last 1 Encounters:  02/14/23 (!) 164.9 kg    Ideal Body Weight:  80.9 kg  BMI:  Body mass index is 49.3 kg/m.  Estimated Nutritional Needs:   Kcal:  2100-2300 kcals  Protein:  160-190 g  Fluid:  UOP + 1 L    Romelle Starcher MS, RDN, LDN, CNSC Registered Dietitian 3 Clinical Nutrition RD Pager and On-Call Pager Number Located in [place]

## 2023-02-14 NOTE — Procedures (Addendum)
Cortrak  Person Inserting Tube:  Thomas, Aspen T, RD Tube Type:  Cortrak - 43 inches Tube Size:  10 Tube Location:  Left nare Secured by: Bridle Technique Used to Measure Tube Placement:  Marking at nare/corner of mouth Cortrak Secured At:  96 cm   Cortrak Tube Team Note:  Consult received to advance a previously placed (7/22) Cortrak feeding tube. Advanced original  tube to 96cm.  X-ray is required, abdominal x-ray has been ordered by the Cortrak team. Please confirm tube placement before using the Cortrak tube.   If the tube becomes dislodged please keep the tube and contact the Cortrak team at [email or web address] for replacement.  If after hours and replacement cannot be delayed, place a NG tube and confirm placement with an abdominal x-ray.    Greig Castilla, RD, LDN Clinical Dietitian RD pager # available in AMION  After hours/weekend pager # available in [facility]

## 2023-02-14 NOTE — Progress Notes (Signed)
Post filer Ionized Calcium 0.36 Systemic Ionized Calcium 1.10 No change to Citrate or Calcium infusion  Next levels to be drawn in 8 hours per protocol.

## 2023-02-14 NOTE — Progress Notes (Addendum)
eLink Physician-Brief Progress Note Patient Name: Phillip Young DOB: 1985/11/03 MRN: [id number]   Date of Service  02/14/2023  HPI/Events of Note  37 year old male with known persistent atrial fibrillation with rapid ventricular response who is currently in vasoplegic shock on norepinephrine and vasopressin at stable doses.  He was able to get extubated today and has remained on low-dose Precedex at 1 mg.  Has increasing agitation tonight.  Previously on Versed and may be experiencing a degree of benzodiazepine withdrawal.  Last QTc 440  eICU Interventions  Will initiate Zyprexa oral, if this is not sufficient, may need to add low-dose benzos although his respiratory status remains tenuous and would prefer non sedating agents.    1478 -thrashing around in bed with CVVHD in place.  Received Zyprexa earlier with insufficient control.  Continues to remain danger to self.  Will add on Ativan 1 mg once scheduled with one-time repeat dose available if this is insufficient.  Also add on up to 3 doses of Haldol if necessary IV.   Intervention Category Minor Interventions: Agitation / anxiety - evaluation and management  Rasheema Truluck 02/14/2023, 2:16 AM

## 2023-02-14 NOTE — Progress Notes (Signed)
Pt currently resting 6l [place]. No resp distress noted at this time. BIPAP on standby. Will continue to monitor.

## 2023-02-14 NOTE — Progress Notes (Signed)
Pt on max ordered pressor. BP becoming labile. CRRT UFH set to 0 per Dr Merrily Pew. Pt mother at bedside aware of pt status.

## 2023-02-14 NOTE — Progress Notes (Signed)
Pt  SBP low into the 70's Levophed infusion increased per Dr Merrily Pew. ABG completed results reported to Dr Merrily Pew. Orders received and carried out.

## 2023-02-14 NOTE — Progress Notes (Signed)
[facility] NEPHROLOGY PROGRESS NOTE  Subjective:  Seen and examined in ICU.  Patient is now extubated, on high flow oxygen.  Remains tachycardic.  On vasopressin, Levophed and Precedex as well.  Discussed with ICU nurse.   Objective Vital signs in last 24 hours: Vitals:   02/14/23 1600 02/14/23 1615 02/14/23 1630 02/14/23 1645  BP:      Pulse:      Resp: (!) 43 (!) 38 (!) 35 (!) 31  Temp: 98.1 F (36.7 C)     TempSrc: Axillary     SpO2: 100%     Weight:      Height:         Physical Exam: Critically ill looking male, looks uncomfortable, extubated. Heart:RRR, s1s2 nl Lungs:coarse breath sound b/l Abdomen:firm and distended+ Extremities:LE edema trace Dialysis Access: RIJ TDC, site looks clean.   Assessment/ Plan:  # Sepsis/septic shock: strep pneumoniae grew on initial BCx's (6/28) w/ presumed PNA. Was on rocephin initially then changed to meropenem and linezolid. Still requiring Levo and vaso.  Also on midodrine.  # AHRF: extubated yesterday. Per CCM.   # ESRD: Required emergent HD on 6/29 because of fluid overload.  Then developed shock and afib/ RVR requiring high doses of pressors.  Started CRRT on 6/29. CRRT was stopped briefly 7/11 then resumed 7/12. Cont CRRT.   # Volume - was getting 100 cc/hr UF for most of last week. Wt's are down another 10kg and pt has no edema, CVP 5. Will change UF to keep even.   # CRRT anticoagulation - pt required change to citrate protocol for frequent clotting started 7/06.  We cont using citrate protocol due to excessive clotting and intolerance to heparin.   # Hypercalcemia: Noted patient has hide corrected calcium level however ionized calcium is low.  He is on citrate protocol with calcium gluconate.  Continue to monitor and changed to 0 calcium in CRRT bath.   # Acute on chronic systolic CHF, nonischemic: EF 16-10%. UF with dialysis.   # Anemia of CKD: Hemoglobin at goal.  Received Aranesp on 7/1 and 7/08.    #A-fib with RVR with hypotension: S/P cardioversion 7/5 -> NSR -> afib RVR 7/6 early AM refractory to [facility] x3 + amiodarone.   # Acute hypoxic and hypercapnic respiratory failure, patient is now extubated, per PCCM.  Vinson Moselle  MD  [facility] 02/14/2023, 4:57 PM  Recent Labs  Lab 02/13/23 1700 02/13/23 2343 02/14/23 0147 02/14/23 0212 02/14/23 1141 02/14/23 1145 02/14/23 1610  HGB  --    < > 12.4*   < > 16.0 13.3 14.3  ALBUMIN 3.1*  --  3.2*  --   --   --   --   CALCIUM 11.2*  --  11.3*  --   --   --   --   PHOS 5.8*  --  5.5*  --   --   --   --   CREATININE 2.43*   < > 2.82*   < > 2.00* 2.90*  --   K 4.8   < > 5.6*   < > 5.2* 5.4* 5.8*   < > = values in this interval not displayed.    Inpatient medications:  apixaban  5 mg Per Tube BID   Chlorhexidine Gluconate Cloth  6 each Topical Q0600   famotidine  20 mg Per Tube Daily   feeding supplement (PROSource TF20)  60 mL Per Tube Q4H   fentaNYL (SUBLIMAZE) injection  50 mcg  Intravenous Once   fiber supplement (BANATROL TF)  60 mL Per Tube BID   Gerhardt's butt cream   Topical Daily   hydrOXYzine  10 mg Per Tube TID   insulin aspart  0-9 Units Subcutaneous Q4H   insulin glargine-yfgn  16 Units Subcutaneous Daily   lidocaine  1 patch Transdermal Q24H   melatonin  5 mg Per Tube QHS   midodrine  30 mg Oral TID WC   multivitamin  1 tablet Per Tube QHS   nutrition supplement (JUVEN)  1 packet Per Tube BID BM   sodium bicarbonate       sodium bicarbonate  100 mEq Intravenous Once   sodium chloride flush  10-40 mL Intravenous Q12H    sodium chloride Stopped (01/22/23 1859)   anticoagulant sodium citrate     calcium gluconate 20 g in dextrose 5 % 1,000 mL infusion 50 mL/hr at 02/14/23 1600   citrate dextrose 3,000 mL (02/13/23 0427)   dexmedetomidine (PRECEDEX) IV infusion 1.2 mcg/kg/hr (02/14/23 1600)   feeding supplement (VITAL 1.5 CAL) 20 mL/hr at 02/14/23 1600   fluconazole (DIFLUCAN) IV 100 mL/hr at 02/14/23 1600    norepinephrine (LEVOPHED) Adult infusion 40 mcg/min (02/14/23 1600)   prismasol B22GK 4/0 1,500 mL/hr at 02/14/23 1523   prismasol B22GK 4/0 600 mL/hr at 02/14/23 1313   vasopressin 0.04 Units/min (02/14/23 1600)   sodium chloride, acetaminophen **OR** acetaminophen, anticoagulant sodium citrate, bisacodyl, docusate, fentaNYL (SUBLIMAZE) injection, haloperidol lactate, ondansetron (ZOFRAN) IV, mouth rinse, polyethylene glycol, polyvinyl alcohol, senna-docusate, sodium bicarbonate, sodium chloride flush, surgical lubricant

## 2023-02-14 NOTE — Progress Notes (Signed)
SLP Cancellation Note  Patient Details Name: Phillip Young MRN: [id number] DOB: 1986/06/17   Cancelled treatment:       Reason Eval/Treat Not Completed: Patient not medically ready. Pt heavily sedated but with high HR, easily agitated per RN. She is expecting to replace NG with a cortrak if possible today.    Shaylene Paganelli, Riley Nearing 02/14/2023, 9:09 AM

## 2023-02-14 NOTE — Progress Notes (Signed)
eLink Physician-Brief Progress Note Patient Name: Phillip Young DOB: 07-03-1986 MRN: [id number]   Date of Service  02/14/2023  HPI/Events of Note  Feeding tube placed earlier today.  Tip terminates in the stomach.  Feeds ordered.  eICU Interventions  Tube okay to use.  Orders updated.     Intervention Category Minor Interventions: Routine modifications to care plan (e.g. PRN medications for pain, fever)  Sylvester Salonga 02/14/2023, 8:51 PM

## 2023-02-15 DIAGNOSIS — R570 Cardiogenic shock: Secondary | ICD-10-CM | POA: Diagnosis not present

## 2023-02-15 DIAGNOSIS — D72829 Elevated white blood cell count, unspecified: Secondary | ICD-10-CM

## 2023-02-15 DIAGNOSIS — R509 Fever, unspecified: Secondary | ICD-10-CM | POA: Diagnosis not present

## 2023-02-15 DIAGNOSIS — A419 Sepsis, unspecified organism: Secondary | ICD-10-CM | POA: Diagnosis not present

## 2023-02-15 DIAGNOSIS — I4891 Unspecified atrial fibrillation: Secondary | ICD-10-CM | POA: Diagnosis not present

## 2023-02-15 DIAGNOSIS — J9601 Acute respiratory failure with hypoxia: Secondary | ICD-10-CM | POA: Diagnosis not present

## 2023-02-15 LAB — POCT I-STAT 7, (LYTES, BLD GAS, ICA,H+H)
Acid-base deficit: 7 mmol/L — ABNORMAL HIGH (ref 0.0–2.0)
Acid-base deficit: 5 mmol/L — ABNORMAL HIGH (ref 0.0–2.0)
Acid-base deficit: 6 mmol/L — ABNORMAL HIGH (ref 0.0–2.0)
Bicarbonate: 19.1 mmol/L — ABNORMAL LOW (ref 20.0–28.0)
Bicarbonate: 19.9 mmol/L — ABNORMAL LOW (ref 20.0–28.0)
Bicarbonate: 17.7 mmol/L — ABNORMAL LOW (ref 20.0–28.0)
Calcium, Ion: 1.13 mmol/L — ABNORMAL LOW (ref 1.15–1.40)
Calcium, Ion: 1.16 mmol/L (ref 1.15–1.40)
Calcium, Ion: 1.12 mmol/L — ABNORMAL LOW (ref 1.15–1.40)
HCT: 41 % (ref 39.0–52.0)
HCT: 38 % — ABNORMAL LOW (ref 39.0–52.0)
HCT: 36 % — ABNORMAL LOW (ref 39.0–52.0)
Hemoglobin: 13.9 g/dL (ref 13.0–17.0)
Hemoglobin: 12.9 g/dL — ABNORMAL LOW (ref 13.0–17.0)
Hemoglobin: 12.2 g/dL — ABNORMAL LOW (ref 13.0–17.0)
O2 Saturation: 100 %
O2 Saturation: 98 %
O2 Saturation: 93 %
Patient temperature: 101.3
Patient temperature: 98.4
Patient temperature: 101.6
Potassium: 5.5 mmol/L — ABNORMAL HIGH (ref 3.5–5.1)
Potassium: 4.7 mmol/L (ref 3.5–5.1)
Potassium: 4.7 mmol/L (ref 3.5–5.1)
Sodium: 133 mmol/L — ABNORMAL LOW (ref 135–145)
Sodium: 133 mmol/L — ABNORMAL LOW (ref 135–145)
Sodium: 134 mmol/L — ABNORMAL LOW (ref 135–145)
TCO2: 20 mmol/L — ABNORMAL LOW (ref 22–32)
TCO2: 21 mmol/L — ABNORMAL LOW (ref 22–32)
TCO2: 19 mmol/L — ABNORMAL LOW (ref 22–32)
pCO2 arterial: 41.1 mmHg (ref 32–48)
pCO2 arterial: 37 mmHg (ref 32–48)
pCO2 arterial: 31.2 mmHg — ABNORMAL LOW (ref 32–48)
pH, Arterial: 7.282 — ABNORMAL LOW (ref 7.35–7.45)
pH, Arterial: 7.338 — ABNORMAL LOW (ref 7.35–7.45)
pH, Arterial: 7.368 (ref 7.35–7.45)
pO2, Arterial: 209 mmHg — ABNORMAL HIGH (ref 83–108)
pO2, Arterial: 105 mmHg (ref 83–108)
pO2, Arterial: 74 mmHg — ABNORMAL LOW (ref 83–108)

## 2023-02-15 LAB — GLUCOSE, CAPILLARY
Glucose-Capillary: 164 mg/dL — ABNORMAL HIGH (ref 70–99)
Glucose-Capillary: 170 mg/dL — ABNORMAL HIGH (ref 70–99)
Glucose-Capillary: 195 mg/dL — ABNORMAL HIGH (ref 70–99)
Glucose-Capillary: 208 mg/dL — ABNORMAL HIGH (ref 70–99)
Glucose-Capillary: 215 mg/dL — ABNORMAL HIGH (ref 70–99)
Glucose-Capillary: 215 mg/dL — ABNORMAL HIGH (ref 70–99)

## 2023-02-15 LAB — CBC
HCT: 39 % (ref 39.0–52.0)
Hemoglobin: 12.4 g/dL — ABNORMAL LOW (ref 13.0–17.0)
MCH: 33.1 pg (ref 26.0–34.0)
MCHC: 31.8 g/dL (ref 30.0–36.0)
MCV: 104 fL — ABNORMAL HIGH (ref 80.0–100.0)
Platelets: 379 10*3/uL (ref 150–400)
RBC: 3.75 MIL/uL — ABNORMAL LOW (ref 4.22–5.81)
RDW: 18.2 % — ABNORMAL HIGH (ref 11.5–15.5)
WBC: 18.4 10*3/uL — ABNORMAL HIGH (ref 4.0–10.5)
nRBC: 0.2 % (ref 0.0–0.2)

## 2023-02-15 LAB — RENAL FUNCTION PANEL
Albumin: 2.8 g/dL — ABNORMAL LOW (ref 3.5–5.0)
Albumin: 2.5 g/dL — ABNORMAL LOW (ref 3.5–5.0)
Anion gap: 20 — ABNORMAL HIGH (ref 5–15)
Anion gap: 19 — ABNORMAL HIGH (ref 5–15)
BUN: 53 mg/dL — ABNORMAL HIGH (ref 6–20)
BUN: 62 mg/dL — ABNORMAL HIGH (ref 6–20)
CO2: 17 mmol/L — ABNORMAL LOW (ref 22–32)
CO2: 19 mmol/L — ABNORMAL LOW (ref 22–32)
Calcium: 10.6 mg/dL — ABNORMAL HIGH (ref 8.9–10.3)
Calcium: 10.9 mg/dL — ABNORMAL HIGH (ref 8.9–10.3)
Chloride: 97 mmol/L — ABNORMAL LOW (ref 98–111)
Chloride: 98 mmol/L (ref 98–111)
Creatinine, Ser: 2.82 mg/dL — ABNORMAL HIGH (ref 0.61–1.24)
Creatinine, Ser: 2.6 mg/dL — ABNORMAL HIGH (ref 0.61–1.24)
GFR, Estimated: 29 mL/min — ABNORMAL LOW (ref >60)
GFR, Estimated: 32 mL/min — ABNORMAL LOW (ref >60)
Glucose, Bld: 211 mg/dL — ABNORMAL HIGH (ref 70–99)
Glucose, Bld: 159 mg/dL — ABNORMAL HIGH (ref 70–99)
Phosphorus: 6.8 mg/dL — ABNORMAL HIGH (ref 2.5–4.6)
Phosphorus: 6.1 mg/dL — ABNORMAL HIGH (ref 2.5–4.6)
Potassium: 5.2 mmol/L — ABNORMAL HIGH (ref 3.5–5.1)
Potassium: 4.7 mmol/L (ref 3.5–5.1)
Sodium: 134 mmol/L — ABNORMAL LOW (ref 135–145)
Sodium: 136 mmol/L (ref 135–145)

## 2023-02-15 LAB — MAGNESIUM: Magnesium: 2.1 mg/dL (ref 1.7–2.4)

## 2023-02-15 LAB — POCT I-STAT, CHEM 8
BUN: 46 mg/dL — ABNORMAL HIGH (ref 6–20)
BUN: 51 mg/dL — ABNORMAL HIGH (ref 6–20)
BUN: 61 mg/dL — ABNORMAL HIGH (ref 6–20)
BUN: 54 mg/dL — ABNORMAL HIGH (ref 6–20)
Calcium, Ion: 0.35 mmol/L — CL (ref 1.15–1.40)
Calcium, Ion: 0.36 mmol/L — CL (ref 1.15–1.40)
Calcium, Ion: 1.19 mmol/L (ref 1.15–1.40)
Calcium, Ion: 0.36 mmol/L — CL (ref 1.15–1.40)
Chloride: 99 mmol/L (ref 98–111)
Chloride: 104 mmol/L (ref 98–111)
Chloride: 99 mmol/L (ref 98–111)
Chloride: 98 mmol/L (ref 98–111)
Creatinine, Ser: 2.1 mg/dL — ABNORMAL HIGH (ref 0.61–1.24)
Creatinine, Ser: 1.9 mg/dL — ABNORMAL HIGH (ref 0.61–1.24)
Creatinine, Ser: 2.8 mg/dL — ABNORMAL HIGH (ref 0.61–1.24)
Creatinine, Ser: 1.9 mg/dL — ABNORMAL HIGH (ref 0.61–1.24)
Glucose, Bld: 247 mg/dL — ABNORMAL HIGH (ref 70–99)
Glucose, Bld: 206 mg/dL — ABNORMAL HIGH (ref 70–99)
Glucose, Bld: 234 mg/dL — ABNORMAL HIGH (ref 70–99)
Glucose, Bld: 201 mg/dL — ABNORMAL HIGH (ref 70–99)
HCT: 47 % (ref 39.0–52.0)
HCT: 37 % — ABNORMAL LOW (ref 39.0–52.0)
HCT: 43 % (ref 39.0–52.0)
HCT: 40 % (ref 39.0–52.0)
Hemoglobin: 16 g/dL (ref 13.0–17.0)
Hemoglobin: 12.6 g/dL — ABNORMAL LOW (ref 13.0–17.0)
Hemoglobin: 14.6 g/dL (ref 13.0–17.0)
Hemoglobin: 13.6 g/dL (ref 13.0–17.0)
Potassium: 4.9 mmol/L (ref 3.5–5.1)
Potassium: 4.2 mmol/L (ref 3.5–5.1)
Potassium: 4.4 mmol/L (ref 3.5–5.1)
Potassium: 4.4 mmol/L (ref 3.5–5.1)
Sodium: 138 mmol/L (ref 135–145)
Sodium: 135 mmol/L (ref 135–145)
Sodium: 138 mmol/L (ref 135–145)
Sodium: 138 mmol/L (ref 135–145)
TCO2: 23 mmol/L (ref 22–32)
TCO2: 17 mmol/L — ABNORMAL LOW (ref 22–32)
TCO2: 21 mmol/L — ABNORMAL LOW (ref 22–32)
TCO2: 22 mmol/L (ref 22–32)

## 2023-02-15 LAB — COOXEMETRY PANEL
Carboxyhemoglobin: 1.2 % (ref 0.5–1.5)
Methemoglobin: 0.7 % (ref 0.0–1.5)
O2 Saturation: 48.2 %
Total hemoglobin: 10.7 g/dL — ABNORMAL LOW (ref 12.0–16.0)

## 2023-02-15 MED ORDER — QUETIAPINE FUMARATE 50 MG PO TABS
50.0000 mg | ORAL_TABLET | Freq: Two times a day (BID) | ORAL | Status: DC
  Administered 2023-02-15 – 2023-02-20 (×11): 50 mg
  Filled 2023-02-15 (×11): qty 1

## 2023-02-15 MED ORDER — ORAL CARE MOUTH RINSE: 15.0000 mL | OROMUCOSAL | Status: DC

## 2023-02-15 MED ORDER — ORAL CARE MOUTH RINSE: 15.0000 mL | OROMUCOSAL | Status: DC | PRN

## 2023-02-15 MED ORDER — ORAL CARE MOUTH RINSE
15.0000 mL | OROMUCOSAL | Status: DC
  Administered 2023-02-15 – 2023-03-23 (×97): 15 mL via OROMUCOSAL

## 2023-02-15 NOTE — Progress Notes (Signed)
Post filter Ionized calcium 0.36 Systemic Ionized calcium 1.12 No changes required to Citrate or Calcium infusion Next levels to be drawn in 8 hours per protocol

## 2023-02-15 NOTE — Progress Notes (Addendum)
Speech Language Pathology Treatment: Dysphagia  Patient Details Name: Phillip Young MRN: [id number] DOB: [birth year] Today's Date: 02/15/2023 Time: 3295-1884 SLP Time Calculation (min) (ACUTE ONLY): 17 min  Assessment / Plan / Recommendation Clinical Impression  Pt continues to present with clinical indicators of pharyngeal dysphagia.  Pt remains aphonic with weak cough, with tachycardia and intermittent tachypnea (HR 150s-160s, RR 19-33).  He is very eager for POs.  SLP provided oral care prior to administration of PO trials. There was no change in vitals with oral care.  Dried secretions removed from oral cavity.  With ice chips and thin liquids by spoon, pt exhibited prompt oral response and good awareness of bolus.  He intermittently required multiple swallows per bolus and there was occasional cough and wet vocal quality.  Vocal quality was difficult to assess 2/2 aphonia following prolonged extubation; suspect glottal insufficiency.  On palpation hyolaryngeal elevation and excursion was seemingly reduced.  Pt will likely need instrumental assessment prior to initiation of PO diet.  Recommend FEES over MBS to avoid transport off floor, to visualize vocal folds, and 2/2 body habitus.  Pt is not ready for instrumental at this time, but should be in coming days.  Recommend ice chips, small amounts of water by spoon with nursing supervision when vitals are stable.  This should help to mobilize secretions, improve oral hygiene, and give pt opportunity to swallow.  Pt with cortrak in place at this time.  Recommend pt remain NPO with alternate means of nutrition, hydration, and medication. Pt may have ice chips and small amounts of water (~1/2 teaspoon) in moderation, after good oral care, when fully awake/alert, with upright positioning and direct nursing supervision, when vitals are stable.     HPI HPI: Pt is a 37 year old male who presented with fever, joint pains, SOB. ETT 6/30-7/20. PMH: ESRD, GERD,  NICM, refractory AFib, ?amio-induced lung toxicity.      SLP Plan  Continue with current plan of care (likely to need FEES)      Recommendations for follow up therapy are one component of a multi-disciplinary discharge planning process, led by the attending physician.  Recommendations may be updated based on patient status, additional functional criteria and insurance authorization.    Recommendations  Diet recommendations: NPO Medication Administration: Via alternative means                  Oral care QID;Oral care prior to ice chip/H20     Dysphagia, unspecified (R13.10)     Continue with current plan of care (likely to need FEES)     Kerrie Pleasure, MA, CCC-SLP Acute Rehabilitation Services Office: [phone] 02/15/2023, 10:40 AM

## 2023-02-15 NOTE — Progress Notes (Signed)
Pt on max ordered dose of pressor support. BP remaining low. CRRT UF set at 0, only filtering blood. CCM MD notified. No new orders at this time as pt on max support. Pt mother at bedside and aware of status.

## 2023-02-15 NOTE — Progress Notes (Signed)
[facility] NEPHROLOGY PROGRESS NOTE  Subjective:  Seen and examined in ICU.  Patient is now extubated, on high flow oxygen.  Had a bad night requiring increased pressors  w/ levo max at 40 micrograms/min and vasopressin max at 0.04.  pt agitated as well per RN. I/O were +8 L overnight, unable to keep even w/ CRRT due to severe hypotension.    Objective Vital signs in last 24 hours: Vitals:   02/14/23 1600 02/14/23 1615 02/14/23 1630 02/14/23 1645  BP:      Pulse:      Resp: (!) 43 (!) 38 (!) 35 (!) 31  Temp: 98.1 F (36.7 C)     TempSrc: Axillary     SpO2: 100%     Weight:      Height:         Physical Exam: Critically ill looking male, looks uncomfortable, extubated. Heart:RRR, s1s2 nl Lungs:coarse breath sound b/l Abdomen: soft, +bs Extremities: 1+ bilat hip edema  Dialysis Access: RIJ TDC, site looks clean.   Assessment/ Plan:  # Hypotension - worsening overnight, I/O + 8L overnight last night.   # Septic shock/ pneumococcal bacteremia: on admission strep pneumoniae grew on initial BCx's (6/28) w/ presumed PNA. Was on rocephin initially then changed to meropenem and linezolid which were completed on 7/18.   # Acute hypoxic and hypercapnic respiratory failure: patient is now extubated, per PCCM.  # ESRD: Required emergent HD on 6/29 because of fluid overload.  Then developed shock and afib/ RVR requiring high doses of pressors.  Started CRRT on 6/29. CRRT was stopped briefly 7/11 then resumed 7/12. Cont CRRT.   # Volume - volume was down 10kg from 7/16 to 7/22 w/ CRRT pulling fluid. Changed to keep even yesterday. However overnight was 8 L + due to worsening hypotension/ shock.   # CRRT anticoagulation - pt required change to citrate protocol for frequent clotting started 7/06.  We cont using citrate protocol due to excessive clotting and intolerance to heparin.   # Hypercalcemia: Noted patient has hide corrected calcium level however ionized calcium is  low.  He is on citrate protocol with calcium gluconate.  Continue to monitor and changed to 0 calcium in CRRT bath.   # Acute on chronic systolic CHF, nonischemic: EF 16-10%. UF with dialysis.   # Anemia of CKD: Hemoglobin at goal.  Received Aranesp on 7/1 and 7/08.   #A-fib with RVR with hypotension: S/P cardioversion 7/5 -> NSR -> afib RVR 7/6 early AM refractory to [facility] x3 + amiodarone.     Vinson Moselle  MD  [facility] 02/15/2023, 12:10 PM  Recent Labs  Lab 02/14/23 1804 02/14/23 2020 02/15/23 0401 02/15/23 0408 02/15/23 1140 02/15/23 1146  HGB  --    < >  --    < > 14.6 12.6*  ALBUMIN 3.0*  --  2.8*  --   --   --   CALCIUM 10.8*  --  10.6*  --   --   --   PHOS 5.9*  --  6.8*  --   --   --   CREATININE 2.72*   < > 2.82*   < > 1.90* 2.80*  K 5.3*   < > 5.2*   < > 4.2 4.4   < > = values in this interval not displayed.    Inpatient medications:  apixaban  5 mg Per Tube BID   Chlorhexidine Gluconate Cloth  6 each Topical Q0600   famotidine  20  mg Per Tube Daily   feeding supplement (PROSource TF20)  60 mL Per Tube Q4H   fentaNYL (SUBLIMAZE) injection  50 mcg Intravenous Once   fiber supplement (BANATROL TF)  60 mL Per Tube BID   Gerhardt's butt cream   Topical Daily   hydrOXYzine  10 mg Per Tube TID   insulin aspart  0-9 Units Subcutaneous Q4H   insulin glargine-yfgn  16 Units Subcutaneous Daily   lidocaine  1 patch Transdermal Q24H   melatonin  5 mg Per Tube QHS   midodrine  30 mg Oral TID WC   multivitamin  1 tablet Per Tube QHS   nutrition supplement (JUVEN)  1 packet Per Tube BID BM   mouth rinse  15 mL Mouth Rinse 4 times per day   QUEtiapine  50 mg Per Tube BID   sodium chloride flush  10-40 mL Intravenous Q12H    sodium chloride Stopped (01/22/23 1859)   anticoagulant sodium citrate     calcium gluconate 20 g in dextrose 5 % 1,000 mL infusion 60 mL/hr at 02/15/23 1100   citrate dextrose 3,000 mL (02/13/23 0427)   dexmedetomidine (PRECEDEX) IV infusion 0.8 mcg/kg/hr  (02/15/23 1100)   feeding supplement (VITAL 1.5 CAL) 50 mL/hr at 02/15/23 1100   fluconazole (DIFLUCAN) IV 100 mL/hr at 02/15/23 1100   norepinephrine (LEVOPHED) Adult infusion 40 mcg/min (02/15/23 1100)   prismasol B22GK 4/0 1,500 mL/hr at 02/15/23 1055   prismasol B22GK 4/0 600 mL/hr at 02/15/23 0544   vasopressin 0.04 Units/min (02/15/23 1100)   sodium chloride, acetaminophen **OR** acetaminophen, anticoagulant sodium citrate, bisacodyl, docusate, fentaNYL (SUBLIMAZE) injection, haloperidol lactate, ondansetron (ZOFRAN) IV, mouth rinse, polyethylene glycol, polyvinyl alcohol, senna-docusate, sodium chloride flush, surgical lubricant

## 2023-02-15 NOTE — Inpatient Diabetes Management (Signed)
Inpatient Diabetes Program Recommendations  AACE/ADA: New Consensus Statement on Inpatient Glycemic Control  Target Ranges:  Prepandial:   less than 140 mg/dL      Peak postprandial:   less than 180 mg/dL (1-2 hours)      Critically ill patients:  140 - 180 mg/dL    Latest Reference Range & Units 02/15/23 00:08 02/15/23 04:06 02/15/23 07:57  Glucose-Capillary 70 - 99 mg/dL 086 (H) 578 (H) 469 (H)    Latest Reference Range & Units 02/14/23 08:13 02/14/23 11:44 02/14/23 16:09 02/14/23 20:42  Glucose-Capillary 70 - 99 mg/dL 629 (H) 528 (H) 413 (H) 125 (H)   Review of Glycemic Control  Diabetes history: NO Outpatient Diabetes medications: NA Current orders for Inpatient glycemic control: Semglee 16 units daily, Novolog 0-9 units Q4H; Vital @ 50 ml/hr  Inpatient Diabetes Program Recommendations:    Insulin: Anticipate tube feedings contributing to hyperglycemia. Would recommend decreasing Semglee to 12 units daily and ordering Novolog 4 units Q4H for tube feeding coverage. If tube feeding is stopped or held then Novolog tube feeding coverage should also be stopped or held.  Thanks, Orlando Penner, RN, MSN, CDCES Diabetes Coordinator Inpatient Diabetes Program [phone] (Team Pager from 8am to 5pm)

## 2023-02-15 NOTE — Consult Note (Signed)
[facility]       Reason for Consult:fever and leukocytosis   Referring Physician: Dr. Merrily Pew  Principal Problem:   Acute hypoxic respiratory failure [name]) Active Problems:   Morbid obesity ([name])   Chronic systolic CHF (congestive heart failure) ([name])   Cardiogenic shock ([name])   ESRD (end stage renal disease) ([name])   ESRD (end stage renal disease) on dialysis ([name])   Hypervolemia   Septic shock ([name])   Atrial fibrillation with RVR ([name])   Pressure injury of skin    apixaban  5 mg Per Tube BID   Chlorhexidine Gluconate Cloth  6 each Topical Q0600   famotidine  20 mg Per Tube Daily   feeding supplement (PROSource TF20)  60 mL Per Tube Q4H   fentaNYL (SUBLIMAZE) injection  50 mcg Intravenous Once   fiber supplement (BANATROL TF)  60 mL Per Tube BID   Gerhardt's butt cream   Topical Daily   hydrOXYzine  10 mg Per Tube TID   insulin aspart  0-9 Units Subcutaneous Q4H   insulin glargine-yfgn  16 Units Subcutaneous Daily   lidocaine  1 patch Transdermal Q24H   melatonin  5 mg Per Tube QHS   midodrine  30 mg Oral TID WC   multivitamin  1 tablet Per Tube QHS   nutrition supplement (JUVEN)  1 packet Per Tube BID BM   mouth rinse  15 mL Mouth Rinse 4 times per day   QUEtiapine  50 mg Per Tube BID   sodium chloride flush  10-40 mL Intravenous Q12H    Recommendations: Repeat blood cultures   Assessment: He has a fever of unknown etiology since 7/19 and leukocytosis and shock.  He has been treated for pneumonia previously while intubated and was afebrile initially but after stopping antibiotics, flared again.  Differential is line infection, drug fever, doubt the Candida is an issue.    HPI: Phillip Young is a 37 y.o. male with a history of multiple medical problems including ESRD on intermittent hemodialysis, heart failure with reduced EF (20-25%), chronic respiratory failure on home oxygen, morbid obesity who initially came in on 6/28 with chest  pain.  He has a history of multiple hospitalizations and on presentation noted to be febrile with leukocytosis of 35.9 and started on broad spectrum antibiotics.  He was intubated for 21 days and now found on nasal cannula 6L.  He continued on linezolid and meropenem through 7/18 for suspected pneumonia and stopped.  On 7/22 he again became febrile to 102 and has had climbing leukocytosis up to 18.4.  Strep pneumoniae on initial blood cultures.  Repeat blood cultures 7/1, 7/7 negative.  Long history of poor compliance with dialysis. S/p cardioversion.  + TDC.  History of MRSA bacteremia last year/late 2022 with a paraspinal abscess, left sacral OM and left SI joint septic arthritis.  His mother is at the bedside and reports he was walking normally prior to admission with no back or hip pain.  + pressure sores.  + CVC line.   Review of Systems:  Constitutional: positive for fevers and chills All other systems reviewed and are negative    Past Medical History:  Diagnosis Date   Acute on chronic respiratory failure with hypoxia ([name]) 04/21/2021   Acute on chronic systolic (congestive) heart failure ([name]) 02/26/2020   Amiodarone toxicity    Anemia    Atrial flutter ([name])    Biventricular congestive heart failure ([name])    Chronic hypoxemic  respiratory failure ([name])    Class 3 severe obesity due to excess calories with serious comorbidity and body mass index (BMI) of 50.0 to 59.9 in adult [facility]) 02/26/2020   Essential hypertension 02/26/2020   GERD without esophagitis 02/26/2020   Hidradenitis suppurativa 02/26/2020   NICM (nonischemic cardiomyopathy) ([name])    Obesity hypoventilation syndrome ([name])    OSA (obstructive sleep apnea)    PAF (paroxysmal atrial fibrillation) ([name])    Pneumonia    Prediabetes 02/26/2020    Social History   Tobacco Use   Smoking status: Former    Current packs/day: 0.00    Types: Cigarettes    Quit date: 2019    Years since quitting: 5.5    Passive exposure:  Current   Smokeless tobacco: Former   Tobacco comments:    quit in 2019  Vaping Use   Vaping status: Never Used  Substance Use Topics   Alcohol use: Never   Drug use: Never    Family History  Problem Relation Age of Onset   Heart disease Mother    Hypertension Mother    Pulmonary Hypertension Mother    Drug abuse Father        died due to Heroin overdose    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, SRA positive   Metoprolol Other (See Comments)    near syncope   Amoxicillin Other (See Comments)    Was hospitalized    Other Swelling and Other (See Comments)    Steroids Fluid seeping out of legs     Physical Exam: Constitutional: in no apparent distress  Vitals:   02/15/23 1330 02/15/23 1345  BP:    Pulse: 78 99  Resp: (!) 25 (!) 31  Temp:    SpO2: 97% 99%   EYES: anicteric ENMT: anicteric Cardiovascular: tachy Respiratory: normal respiratory effort on nasal cannula [id number] obese, soft, nt Musculoskeletal: cold, no edema Skin: no rash  Lab Results  Component Value Date   WBC 18.4 (H) 02/15/2023   HGB 12.6 (L) 02/15/2023   HCT 37.0 (L) 02/15/2023   MCV 104.0 (H) 02/15/2023   PLT 379 02/15/2023    Lab Results  Component Value Date   CREATININE 2.80 (H) 02/15/2023   BUN 61 (H) 02/15/2023   NA 135 02/15/2023   K 4.4 02/15/2023   CL 104 02/15/2023   CO2 17 (L) 02/15/2023    Lab Results  Component Value Date   ALT 11 01/21/2023   AST 18 01/21/2023   ALKPHOS 97 01/21/2023     Microbiology: Recent Results (from the past 240 hour(s))  Culture, Respiratory w Gram Stain     Status: None   Collection Time: 02/10/23  8:50 AM   Specimen: Tracheal Aspirate; Respiratory  Result Value Ref Range Status   Specimen Description TRACHEAL ASPIRATE   Final   Special Requests NONE  Final   Gram Stain   Final    FEW WBC PRESENT, PREDOMINANTLY PMN MODERATE YEAST Performed at [facility], [address], [place], [place] [address]    Culture   Final    MODERATE CANDIDA DUBLINIENSIS MODERATE CANDIDA ALBICANS    Report Status 02/12/2023 FINAL  Final    Gardiner Barefoot, MD [email or web address] 02/15/2023, 1:57 PM

## 2023-02-15 NOTE — Plan of Care (Signed)
  Problem: Clinical Measurements: Goal: Respiratory complications will improve Outcome: Progressing   Problem: Nutrition: Goal: Adequate nutrition will be maintained Outcome: Progressing   Problem: Elimination: Goal: Will not experience complications related to bowel motility Outcome: Progressing   Problem: Pain Managment: Goal: General experience of comfort will improve Outcome: Progressing   Problem: Clinical Measurements: Goal: Cardiovascular complication will be avoided Outcome: Not Progressing   Problem: Activity: Goal: Risk for activity intolerance will decrease Outcome: Not Progressing   Problem: Skin Integrity: Goal: Risk for impaired skin integrity will decrease Outcome: Not Progressing

## 2023-02-15 NOTE — Progress Notes (Signed)
NAME:  Bernadette Armijo, MRN:  [id number], DOB:  27-Aug-1985, LOS: 25 ADMISSION DATE:  01/21/2023, CONSULTATION DATE:  01/22/23 REFERRING MD:  Danise Edge, CHIEF COMPLAINT:  SOB   History of Present Illness:  37 year old man well known to the PCCM and AHF services who presented to [facility] 6/28 with fevers, joint pains, SOB. PMHx significant for ESRD, NICM, refractory AFib, ?amio-induced lung toxicity. Patient reportedly missed HD on Thursday (has a history of leaving HD early). Reportedly ~10kg over dry weight with worsening breathing status, AFib/RVR and borderline BP.  PCCM consulted for ICU admission/further evaluation and management.  Pertinent Medical History:   Past Medical History:  Diagnosis Date   Acute on chronic respiratory failure with hypoxia ([name]) 04/21/2021   Acute on chronic systolic (congestive) heart failure ([name]) 02/26/2020   Amiodarone toxicity    Anemia    Atrial flutter ([name])    Biventricular congestive heart failure ([name])    Chronic hypoxemic respiratory failure ([name])    Class 3 severe obesity due to excess calories with serious comorbidity and body mass index (BMI) of 50.0 to 59.9 in adult [facility]) 02/26/2020   Essential hypertension 02/26/2020   GERD without esophagitis 02/26/2020   Hidradenitis suppurativa 02/26/2020   NICM (nonischemic cardiomyopathy) ([name])    Obesity hypoventilation syndrome ([name])    OSA (obstructive sleep apnea)    PAF (paroxysmal atrial fibrillation) ([name])    Pneumonia    Prediabetes 02/26/2020   Significant Hospital Events: Including procedures, antibiotic start and stop dates in addition to other pertinent events   6/28 Admit, emergent HD 6/29 Afib/RVR, shock, ICU transfer 6/30 Intubated and placed on vasopressor support and started on CRRT 7/1 Remains intubated on CRRT continue with pressor support 7/2 Agitated with SAT, remains on CRRT and low dose levophed 7/3 low dose NE, ongoing CRRT 7/4 Attempted SBT this am, apneic but severely agitated,  desaturated, HR up 130/140s.  Re-sedated but HR remains 140's, NE up from 6 to . NSR on EKG.  Was pulling 50-100on UF, since kept even for now.  CVP 8 7/5 DCCV again attempted this a.m. with success, remains on norepinephrine and vasopressin.  7/6 went into A-fib with RVR, attempted DCCV x 3, started on amiodarone infusion after amiodarone bolus 7/7 Multiple DCCV attempts without success. TF held due to vomiting. Tx denied to [facility]  7/8 KUB looked OK. No further vomiting. Tolerating TF. Large BM overnight.  7/9 vasopressors back up, remains rapid afib/ flutter, Bmx 1, wua causing hemodynamic instability 7/10 weaned sedation on mothers request, HR remains 140-220s, tmax 100.6, DNR, no escalation  Interim History / Subjective:  Patient blood pressure is dropping, currently on 40 mics of Levophed and vasopressin He remained agitated on Precedex Continue to spike fever with Tmax 102 Remain on 6 L nasal cannula oxygen Heart rate is still not getting corrected, currently running in the 170s   Objective:  Blood pressure (!) 88/58, pulse (!) 112, temperature 98.1 F (36.7 C), temperature source Axillary, resp. rate (!) 24, height 6' (1.829 m), weight (!) 162.8 kg, SpO2 100%. CVP:  [0 mmHg-8 mmHg] 6 mmHg      Intake/Output Summary (Last 24 hours) at 02/15/2023 0945 Last data filed at 02/15/2023 0900 Gross per 24 hour  Intake 10026.61 ml  Output 3931 ml  Net 6095.61 ml   Filed Weights   02/13/23 0600 02/14/23 0100 02/15/23 0545  Weight: (!) 166.2 kg (!) 164.9 kg (!) 162.8 kg   Physical Examination: General: Acute on chronically ill-appearing morbidly obese  young male, lying on the bed HEENT: [place]/AT, eyes anicteric.  moist mucus membranes Neuro: Awake, tracking examiner, following commands, generalized weak Chest: Coarse breath sounds, no wheezes or rhonchi Heart: Irregular, irregular tachycardic Abdomen: Soft, nontender, nondistended, bowel sounds present Skin: No rash  Labs and  images were reviewed 7.28/41/209/100% Na 135, K 4.4 WBC 18.4/Hb12.4/ PLT 379   Resolved Hospital Problem List:  Hematuria due to traumatic Foley insertion  Assessment & Plan:  Acute-on-chronic hypoxemic respiratory failure Bilateral multifocal pneumonia- initially strep and superimposed HCAP Pneumococcal bacteremia, treated OHS/OSA, noncompliant with CPAP Acute on chronic HFrEF with pulmonary edema Paroxysmal rapid A-fib/flutter with RVR,  refractory, with previous amio induced lung and liver toxicity (05/2022) Nonischemic cardiomyopathy Mixed shock septic/cardiogenic End-stage renal disease on hemodialysis Hypervolemic hyponatremia Hyperphosphatemia Hyperkalemia  High anion gap metabolic acidosis Hypercalcemia  Patient remains on 6 L oxygen He completed 10 days course of Zyvox and meropenem now continued to spike fever since we stopped antibiotic, last night his Tmax was 102 Respiratory culture is growing yeast Fluconazole was added yesterday Will ask infectious disease consult Continue to remain in A-fib with RVR with heart rate in 170s, amiodarone did not help, he had cardioversion done multiple times, without much help Unfortunately his A-fib is refractory to any treatment and electrical cardioversion Conitnue Midodrine at 30 tid Continue to require increasing doses of vasopressor support, currently at 40 mics of Levophed and vasopressin of 0.04 Spoke with nephrology, will continue with CRRT, with net even balance Titrate Precedex with RASS goal 0/-1, his fevers can be due to Precedex but unfortunately he gets really agitated and continued to require Monitor electrolytes Coox is 40%  Overall poor prognosis  Best Practice (right click and "Reselect all SmartList Selections" daily)   Diet/type: Start trickle tube feeds DVT prophylaxis: DOAC GI prophylaxis: Famotidine Lines: Yes still needed Foley: n/a Code Status:  Full Last date of multidisciplinary goals of care:  02/15/23: [name] was updated at bedside in detail  The patient is critically ill due to Shock/Acute respiratory failure.  Critical care was necessary to treat or prevent imminent or life-threatening deterioration.  Critical care was time spent personally by me on the following activities: development of treatment plan with patient and/or surrogate as well as nursing, discussions with consultants, evaluation of patient's response to treatment, examination of patient, obtaining history from patient or surrogate, ordering and performing treatments and interventions, ordering and review of laboratory studies, ordering and review of radiographic studies, pulse oximetry, re-evaluation of patient's condition and participation in multidisciplinary rounds.   During this encounter critical care time was devoted to patient care services described in this note for 36 minutes.     Cheri Fowler, MD [facility] See Amion for pager If no response to pager, please call [phone] until 7pm After 7pm, Please call E-link [phone]

## 2023-02-15 NOTE — Progress Notes (Signed)
Post filter Ionized calcium 0.36 Systemic Ionized calcium 1.19 No changes required to Citrate or Calcium infusion Next levels to be drawn in 8 hours per protocol

## 2023-02-16 DIAGNOSIS — I4891 Unspecified atrial fibrillation: Secondary | ICD-10-CM | POA: Diagnosis not present

## 2023-02-16 DIAGNOSIS — R509 Fever, unspecified: Secondary | ICD-10-CM | POA: Diagnosis not present

## 2023-02-16 DIAGNOSIS — I5022 Chronic systolic (congestive) heart failure: Secondary | ICD-10-CM | POA: Diagnosis not present

## 2023-02-16 DIAGNOSIS — R570 Cardiogenic shock: Secondary | ICD-10-CM | POA: Diagnosis not present

## 2023-02-16 DIAGNOSIS — D72829 Elevated white blood cell count, unspecified: Secondary | ICD-10-CM | POA: Diagnosis not present

## 2023-02-16 DIAGNOSIS — J9601 Acute respiratory failure with hypoxia: Secondary | ICD-10-CM | POA: Diagnosis not present

## 2023-02-16 LAB — POCT I-STAT, CHEM 8
BUN: 53 mg/dL — ABNORMAL HIGH (ref 6–20)
BUN: 54 mg/dL — ABNORMAL HIGH (ref 6–20)
BUN: 62 mg/dL — ABNORMAL HIGH (ref 6–20)
Calcium, Ion: 0.34 mmol/L — CL (ref 1.15–1.40)
Calcium, Ion: 0.38 mmol/L — CL (ref 1.15–1.40)
Calcium, Ion: 1.12 mmol/L — ABNORMAL LOW (ref 1.15–1.40)
Chloride: 101 mmol/L (ref 98–111)
Chloride: 96 mmol/L — ABNORMAL LOW (ref 98–111)
Chloride: 98 mmol/L (ref 98–111)
Creatinine, Ser: 1.5 mg/dL — ABNORMAL HIGH (ref 0.61–1.24)
Creatinine, Ser: 1.8 mg/dL — ABNORMAL HIGH (ref 0.61–1.24)
Creatinine, Ser: 2.6 mg/dL — ABNORMAL HIGH (ref 0.61–1.24)
Glucose, Bld: 201 mg/dL — ABNORMAL HIGH (ref 70–99)
Glucose, Bld: 208 mg/dL — ABNORMAL HIGH (ref 70–99)
Glucose, Bld: 233 mg/dL — ABNORMAL HIGH (ref 70–99)
HCT: 33 % — ABNORMAL LOW (ref 39.0–52.0)
HCT: 34 % — ABNORMAL LOW (ref 39.0–52.0)
HCT: 37 % — ABNORMAL LOW (ref 39.0–52.0)
Hemoglobin: 11.2 g/dL — ABNORMAL LOW (ref 13.0–17.0)
Hemoglobin: 11.6 g/dL — ABNORMAL LOW (ref 13.0–17.0)
Hemoglobin: 12.6 g/dL — ABNORMAL LOW (ref 13.0–17.0)
Potassium: 3.8 mmol/L (ref 3.5–5.1)
Potassium: 4.3 mmol/L (ref 3.5–5.1)
Potassium: 4.5 mmol/L (ref 3.5–5.1)
Sodium: 134 mmol/L — ABNORMAL LOW (ref 135–145)
Sodium: 137 mmol/L (ref 135–145)
Sodium: 137 mmol/L (ref 135–145)
TCO2: 19 mmol/L — ABNORMAL LOW (ref 22–32)
TCO2: 22 mmol/L (ref 22–32)
TCO2: 26 mmol/L (ref 22–32)

## 2023-02-16 LAB — POCT I-STAT EG7
Acid-base deficit: 9 mmol/L — ABNORMAL HIGH (ref 0.0–2.0)
Bicarbonate: 20.9 mmol/L (ref 20.0–28.0)
Calcium, Ion: 0.34 mmol/L — CL (ref 1.15–1.40)
HCT: 36 % — ABNORMAL LOW (ref 39.0–52.0)
Hemoglobin: 12.2 g/dL — ABNORMAL LOW (ref 13.0–17.0)
O2 Saturation: 39 %
Potassium: 4 mmol/L (ref 3.5–5.1)
Sodium: 137 mmol/L (ref 135–145)
TCO2: 23 mmol/L (ref 22–32)
pCO2, Ven: 60.4 mmHg — ABNORMAL HIGH (ref 44–60)
pH, Ven: 7.146 — CL (ref 7.25–7.43)
pO2, Ven: 30 mmHg — CL (ref 32–45)

## 2023-02-16 LAB — POCT I-STAT 7, (LYTES, BLD GAS, ICA,H+H)
Acid-base deficit: 5 mmol/L — ABNORMAL HIGH (ref 0.0–2.0)
Acid-base deficit: 5 mmol/L — ABNORMAL HIGH (ref 0.0–2.0)
Acid-base deficit: 6 mmol/L — ABNORMAL HIGH (ref 0.0–2.0)
Bicarbonate: 18.4 mmol/L — ABNORMAL LOW (ref 20.0–28.0)
Bicarbonate: 19.2 mmol/L — ABNORMAL LOW (ref 20.0–28.0)
Bicarbonate: 19.6 mmol/L — ABNORMAL LOW (ref 20.0–28.0)
Calcium, Ion: 1.07 mmol/L — ABNORMAL LOW (ref 1.15–1.40)
Calcium, Ion: 1.08 mmol/L — ABNORMAL LOW (ref 1.15–1.40)
Calcium, Ion: 1.1 mmol/L — ABNORMAL LOW (ref 1.15–1.40)
HCT: 30 % — ABNORMAL LOW (ref 39.0–52.0)
HCT: 32 % — ABNORMAL LOW (ref 39.0–52.0)
HCT: 33 % — ABNORMAL LOW (ref 39.0–52.0)
Hemoglobin: 10.2 g/dL — ABNORMAL LOW (ref 13.0–17.0)
Hemoglobin: 10.9 g/dL — ABNORMAL LOW (ref 13.0–17.0)
Hemoglobin: 11.2 g/dL — ABNORMAL LOW (ref 13.0–17.0)
O2 Saturation: 94 %
O2 Saturation: 97 %
O2 Saturation: 98 %
Patient temperature: 101.1
Patient temperature: 36.6
Potassium: 3.9 mmol/L (ref 3.5–5.1)
Potassium: 4.1 mmol/L (ref 3.5–5.1)
Potassium: 4.4 mmol/L (ref 3.5–5.1)
Sodium: 133 mmol/L — ABNORMAL LOW (ref 135–145)
Sodium: 134 mmol/L — ABNORMAL LOW (ref 135–145)
Sodium: 135 mmol/L (ref 135–145)
TCO2: 19 mmol/L — ABNORMAL LOW (ref 22–32)
TCO2: 20 mmol/L — ABNORMAL LOW (ref 22–32)
TCO2: 21 mmol/L — ABNORMAL LOW (ref 22–32)
pCO2 arterial: 31.3 mmHg — ABNORMAL LOW (ref 32–48)
pCO2 arterial: 35 mmHg (ref 32–48)
pCO2 arterial: 35.2 mmHg (ref 32–48)
pH, Arterial: 7.344 — ABNORMAL LOW (ref 7.35–7.45)
pH, Arterial: 7.353 (ref 7.35–7.45)
pH, Arterial: 7.384 (ref 7.35–7.45)
pO2, Arterial: 101 mmHg (ref 83–108)
pO2, Arterial: 77 mmHg — ABNORMAL LOW (ref 83–108)
pO2, Arterial: 91 mmHg (ref 83–108)

## 2023-02-16 LAB — COOXEMETRY PANEL
Carboxyhemoglobin: 1 % (ref 0.5–1.5)
Carboxyhemoglobin: 2.3 % — ABNORMAL HIGH (ref 0.5–1.5)
Methemoglobin: <0.7 % (ref 0.0–1.5)
Methemoglobin: 0.8 % (ref 0.0–1.5)
O2 Saturation: 60.7 %
O2 Saturation: 57.6 %
Total hemoglobin: 11 g/dL — ABNORMAL LOW (ref 12.0–16.0)
Total hemoglobin: 10.6 g/dL — ABNORMAL LOW (ref 12.0–16.0)

## 2023-02-16 LAB — CBC
HCT: 33.3 % — ABNORMAL LOW (ref 39.0–52.0)
Hemoglobin: 10.5 g/dL — ABNORMAL LOW (ref 13.0–17.0)
MCH: 32.2 pg (ref 26.0–34.0)
MCHC: 31.5 g/dL (ref 30.0–36.0)
MCV: 102.1 fL — ABNORMAL HIGH (ref 80.0–100.0)
Platelets: 271 10*3/uL (ref 150–400)
RBC: 3.26 MIL/uL — ABNORMAL LOW (ref 4.22–5.81)
RDW: 18.5 % — ABNORMAL HIGH (ref 11.5–15.5)
WBC: 17.1 10*3/uL — ABNORMAL HIGH (ref 4.0–10.5)
nRBC: 0.3 % — ABNORMAL HIGH (ref 0.0–0.2)

## 2023-02-16 LAB — GLUCOSE, CAPILLARY
Glucose-Capillary: 170 mg/dL — ABNORMAL HIGH (ref 70–99)
Glucose-Capillary: 175 mg/dL — ABNORMAL HIGH (ref 70–99)
Glucose-Capillary: 179 mg/dL — ABNORMAL HIGH (ref 70–99)
Glucose-Capillary: 185 mg/dL — ABNORMAL HIGH (ref 70–99)
Glucose-Capillary: 197 mg/dL — ABNORMAL HIGH (ref 70–99)
Glucose-Capillary: 205 mg/dL — ABNORMAL HIGH (ref 70–99)

## 2023-02-16 LAB — RENAL FUNCTION PANEL
Albumin: 2.3 g/dL — ABNORMAL LOW (ref 3.5–5.0)
Albumin: 2.1 g/dL — ABNORMAL LOW (ref 3.5–5.0)
Anion gap: 19 — ABNORMAL HIGH (ref 5–15)
Anion gap: 17 — ABNORMAL HIGH (ref 5–15)
BUN: 64 mg/dL — ABNORMAL HIGH (ref 6–20)
BUN: 63 mg/dL — ABNORMAL HIGH (ref 6–20)
CO2: 18 mmol/L — ABNORMAL LOW (ref 22–32)
CO2: 19 mmol/L — ABNORMAL LOW (ref 22–32)
Calcium: 10.1 mg/dL (ref 8.9–10.3)
Calcium: 9.3 mg/dL (ref 8.9–10.3)
Chloride: 96 mmol/L — ABNORMAL LOW (ref 98–111)
Chloride: 97 mmol/L — ABNORMAL LOW (ref 98–111)
Creatinine, Ser: 2.59 mg/dL — ABNORMAL HIGH (ref 0.61–1.24)
Creatinine, Ser: 2.3 mg/dL — ABNORMAL HIGH (ref 0.61–1.24)
GFR, Estimated: 32 mL/min — ABNORMAL LOW (ref >60)
GFR, Estimated: 37 mL/min — ABNORMAL LOW (ref >60)
Glucose, Bld: 199 mg/dL — ABNORMAL HIGH (ref 70–99)
Glucose, Bld: 169 mg/dL — ABNORMAL HIGH (ref 70–99)
Phosphorus: 4.7 mg/dL — ABNORMAL HIGH (ref 2.5–4.6)
Phosphorus: 4 mg/dL (ref 2.5–4.6)
Potassium: 4.5 mmol/L (ref 3.5–5.1)
Potassium: 3.9 mmol/L (ref 3.5–5.1)
Sodium: 133 mmol/L — ABNORMAL LOW (ref 135–145)
Sodium: 133 mmol/L — ABNORMAL LOW (ref 135–145)

## 2023-02-16 LAB — CULTURE, BLOOD (ROUTINE X 2): Culture: NO GROWTH

## 2023-02-16 LAB — MAGNESIUM: Magnesium: 2 mg/dL (ref 1.7–2.4)

## 2023-02-16 MED ORDER — INSULIN GLARGINE-YFGN 100 UNIT/ML ~~LOC~~ SOLN
12.0000 [IU] | Freq: Every day | SUBCUTANEOUS | Status: DC
  Administered 2023-02-16 – 2023-02-26 (×11): 12 [IU] via SUBCUTANEOUS
  Filled 2023-02-16 (×12): qty 0.12

## 2023-02-16 MED ORDER — CALCIUM GLUCONATE-NACL 2-0.675 GM/100ML-% IV SOLN
2.0000 g | Freq: Once | INTRAVENOUS | Status: AC
  Administered 2023-02-17: 2000 mg via INTRAVENOUS
  Filled 2023-02-16: qty 100

## 2023-02-16 MED ORDER — EPINEPHRINE HCL 5 MG/250ML IV SOLN IN NS
0.5000 ug/min | INTRAVENOUS | Status: DC
  Administered 2023-02-17: 2 ug/min via INTRAVENOUS
  Filled 2023-02-16: qty 250

## 2023-02-16 MED ORDER — SODIUM CHLORIDE 0.9 % IV SOLN
8.0000 mg/kg | Freq: Every day | INTRAVENOUS | Status: AC
  Administered 2023-02-16 – 2023-02-22 (×7): 900 mg via INTRAVENOUS
  Filled 2023-02-16 (×7): qty 18

## 2023-02-16 MED ORDER — INSULIN ASPART 100 UNIT/ML IJ SOLN
2.0000 [IU] | INTRAMUSCULAR | Status: DC
  Administered 2023-02-16 – 2023-02-26 (×43): 2 [IU] via SUBCUTANEOUS

## 2023-02-16 MED ORDER — SODIUM BICARBONATE 8.4 % IV SOLN
INTRAVENOUS | Status: DC
  Filled 2023-02-16: qty 1000

## 2023-02-16 NOTE — Progress Notes (Signed)
[facility] NEPHROLOGY PROGRESS NOTE  Subjective:  Seen and examined in ICU. Wt's up at 168 today   Objective Vital signs in last 24 hours: Vitals:   02/14/23 1600 02/14/23 1615 02/14/23 1630 02/14/23 1645  BP:      Pulse:      Resp: (!) 43 (!) 38 (!) 35 (!) 31  Temp: 98.1 F (36.7 C)     TempSrc: Axillary     SpO2: 100%     Weight:      Height:         Physical Exam: Critically ill looking male, looks uncomfortable, extubated. Heart:RRR, s1s2 nl Lungs:coarse breath sound b/l Abdomen: soft, +bs Extremities: 1+ bilat hip edema  Dialysis Access: RIJ TDC, site looks clean.   OP HD: [facility] TTS  4h   180kg  425/1.5  3K/2.5Ca bath  TDC     Assessment/ Plan:  # Hypotension - worsened again on 7/22 overnight and pressors remain at high doses (levo 30, vaso 0.04). per CCM.   # Septic shock/ pneumococcal bacteremia: on admission strep pneumoniae grew on initial BCx's (6/28) w/ presumed PNA. Was on rocephin initially then changed to meropenem and linezolid which were completed on 7/18.   # Acute hypoxic and hypercapnic respiratory failure: patient is now extubated.  # ESRD: Required emergent HD on 6/29 because of fluid overload.  Then developed shock and afib/ RVR requiring high doses of pressors.  Started CRRT on 6/29. CRRT was stopped briefly 7/11 then resumed 7/12. Unable to d/c CRRT due to serious hypotension on pressor support. Cont CRRT.   # Volume - admit wts were 182- 187kg (dry wt 180kg). After 1st two weeks of CRRT wts settle in around 171- 175kg. Around 7/17 we started pulling fluid again and wts' dropped to 163kg at nadir. Then pt was severe hypotensive episode and is now back up to 168kg. CVP's today 9- 23 , doubt accuracy. CXR 7/22 is poor film, no obvious edema. Continue to attempt to "keep even" (RN says he is on average currently 140cc/hr net +).    # CRRT anticoagulation - pt required change to citrate protocol for frequent clotting started 7/06.   We continue using citrate protocol due to excessive clotting and intolerance to heparin.   # Hypercalcemia: Noted patient has high corrected calcium level of around 11.4, but stable.  Ionized Ca++ is 1.10 wnl.  He is on citrate protocol with calcium gluconate, and we are using 0 Ca fluids w/ his post HD IVF's and with the dialysate. Continue to monitor.   # Acute on chronic systolic CHF, nonischemic: EF 40-98%.  # Anemia of CKD: Hemoglobin at goal.  Received Aranesp on 7/1 and 7/08.   #A-fib with RVR with hypotension: S/P cardioversion 7/5 -> NSR -> afib RVR 7/6 early AM refractory to [facility] x3 + amiodarone.     Phillip Moselle  MD  [facility] 02/16/2023, 11:56 AM  Recent Labs  Lab 02/15/23 1535 02/15/23 1549 02/16/23 0444 02/16/23 0446 02/16/23 0448 02/16/23 0459  HGB  --    < >  --  12.6* 10.5* 11.2*  ALBUMIN 2.5*  --  2.3*  --   --   --   CALCIUM 10.9*  --  10.1  --   --   --   PHOS 6.1*  --  4.7*  --   --   --   CREATININE 2.60*   < > 2.59* 1.80*  --   --   K 4.7   < >  4.5 4.3  --  4.4   < > = values in this interval not displayed.    Inpatient medications:  apixaban  5 mg Per Tube BID   Chlorhexidine Gluconate Cloth  6 each Topical Q0600   famotidine  20 mg Per Tube Daily   feeding supplement (PROSource TF20)  60 mL Per Tube Q4H   fentaNYL (SUBLIMAZE) injection  50 mcg Intravenous Once   fiber supplement (BANATROL TF)  60 mL Per Tube BID   Gerhardt's butt cream   Topical Daily   hydrOXYzine  10 mg Per Tube TID   insulin aspart  0-9 Units Subcutaneous Q4H   insulin aspart  2 Units Subcutaneous Q4H   insulin glargine-yfgn  12 Units Subcutaneous Daily   lidocaine  1 patch Transdermal Q24H   melatonin  5 mg Per Tube QHS   midodrine  30 mg Oral TID WC   multivitamin  1 tablet Per Tube QHS   nutrition supplement (JUVEN)  1 packet Per Tube BID BM   mouth rinse  15 mL Mouth Rinse 4 times per day   QUEtiapine  50 mg Per Tube BID   sodium chloride flush  10-40 mL Intravenous Q12H     sodium chloride Stopped (01/22/23 1859)   anticoagulant sodium citrate     calcium gluconate 20 g in dextrose 5 % 1,000 mL infusion 60 mL/hr at 02/16/23 1100   citrate dextrose 3,000 mL (02/13/23 0427)   DAPTOmycin (CUBICIN) 900 mg in sodium chloride 0.9 % IVPB     dexmedetomidine (PRECEDEX) IV infusion 1 mcg/kg/hr (02/16/23 1112)   feeding supplement (VITAL 1.5 CAL) 50 mL/hr at 02/16/23 1100   fluconazole (DIFLUCAN) IV 800 mg (02/16/23 1059)   norepinephrine (LEVOPHED) Adult infusion 38 mcg/min (02/16/23 1100)   prismasol B22GK 4/0 1,500 mL/hr at 02/16/23 1018   prismasol B22GK 4/0 600 mL/hr at 02/15/23 1404   vasopressin 0.04 Units/min (02/16/23 1104)   sodium chloride, acetaminophen **OR** acetaminophen, anticoagulant sodium citrate, bisacodyl, docusate, fentaNYL (SUBLIMAZE) injection, haloperidol lactate, ondansetron (ZOFRAN) IV, mouth rinse, polyethylene glycol, polyvinyl alcohol, senna-docusate, sodium chloride flush, surgical lubricant

## 2023-02-16 NOTE — Consult Note (Signed)
WOC Nurse Consult Note: Reason for Consult: Consulted to assess buttocks and perineal skin, frequently exposed to moisture.  Flexiseal in place, but stool is clear liquid and appears to be leaking.  Wounds to buttocks are weeping and wet. Sacrum:  stage 3 pressure injury Bilateral thighs, tucked beneath scrotum:  0.4 cm x 0.4 cm x 0.2 cm lesions  Underpads are saturated with serosanguinous effluent that I believe to be both stool and exudate from the buttocks' wounds Wound type:moisture associated skin damage Pressure Injury POA: No Measurement: Left hip remains darkened as noted at previous assessment and scant peeling of epithelium noted.   Buttocks and posterior thighs:  12 cm x 8.5 cm x 0.2 cm on each gluteal fold Sacrum:  2 cm x 0.3 cm in sacral skin fold, moisture and pressure, likely etiology Wound UJW:JXBJY red, bleeds with care Drainage (amount, consistency, odor) moderate serosanguinous Periwound: frequently moist Dressing procedure/placement/frequency: To nonintact lesions on thighs/buttocks/beneath scrotum:  Cleanse with soap and water and pat dry  Apply Aquacel (LAWSON # [id number]) to wound beds and cover with silicone foam.  Change every other day and PRN soilage.    Will see every 7-10 days to assess effectiveness.  Mike Gip MSN, RN, FNP-BC CWON Wound, Ostomy, Continence Nurse Outpatient [facility] [phone] Pager [phone]

## 2023-02-16 NOTE — Progress Notes (Signed)
[facility]   Reason for visit: Follow up on fever  Interval History: remains on pressor support.  Tmax 101.5.  WBC 17.1.    On fluconazole  Physical Exam: Constitutional:  Vitals:   02/16/23 1000 02/16/23 1100  BP:    Pulse: (!) 140 (!) 137  Resp: (!) 30 (!) 27  Temp:    SpO2: 100% 100%   patient appears in NAD Respiratory: Normal respiratory effort  Review of Systems: Constitutional: positive for fevers  Lab Results  Component Value Date   WBC 17.1 (H) 02/16/2023   HGB 11.2 (L) 02/16/2023   HCT 33.0 (L) 02/16/2023   MCV 102.1 (H) 02/16/2023   PLT 271 02/16/2023    Lab Results  Component Value Date   CREATININE 1.80 (H) 02/16/2023   BUN 53 (H) 02/16/2023   NA 133 (L) 02/16/2023   K 4.4 02/16/2023   CL 98 02/16/2023   CO2 18 (L) 02/16/2023    Lab Results  Component Value Date   ALT 11 01/21/2023   AST 18 01/21/2023   ALKPHOS 97 01/21/2023     Microbiology: Recent Results (from the past 240 hour(s))  Culture, Respiratory w Gram Stain     Status: None   Collection Time: 02/10/23  8:50 AM   Specimen: Tracheal Aspirate; Respiratory  Result Value Ref Range Status   Specimen Description TRACHEAL ASPIRATE  Final   Special Requests NONE  Final   Gram Stain   Final    FEW WBC PRESENT, PREDOMINANTLY PMN MODERATE YEAST Performed at [facility], [address] [place], [place] [address]    Culture   Final    MODERATE CANDIDA DUBLINIENSIS MODERATE CANDIDA ALBICANS    Report Status 02/12/2023 FINAL  Final  Culture, blood (Routine X 2) w Reflex to ID Panel     Status: None (Preliminary result)   Collection Time: 02/15/23  2:52 PM   Specimen: BLOOD LEFT HAND  Result Value Ref Range Status   Specimen Description BLOOD LEFT HAND  Final   Special Requests   Final    BOTTLES DRAWN AEROBIC ONLY Blood Culture results may not be optimal due to an inadequate volume of blood received in culture bottles   Culture   Final    NO  GROWTH < 24 HOURS Performed at [facility], [address] [place], [place] [address]    Report Status PENDING  Incomplete  Culture, blood (Routine X 2) w Reflex to ID Panel     Status: None (Preliminary result)   Collection Time: 02/15/23  2:59 PM   Specimen: BLOOD RIGHT HAND  Result Value Ref Range Status   Specimen Description BLOOD RIGHT HAND  Final   Special Requests   Final    BOTTLES DRAWN AEROBIC ONLY Blood Culture results may not be optimal due to an inadequate volume of blood received in culture bottles   Culture   Final    NO GROWTH < 24 HOURS Performed at [facility], [address], [place], [place] [address]    Report Status PENDING  Incomplete    Impression/Plan:  1. Fever - unclear etiology.  Blood cultures sent and no growth.  Has lines but look ok with no signs of infection and needed for his care.   I will start daptomcyin empirically  Consider changing lines, if/when possible.   I doubt the Candida in the respiratory cultures is significant.  Will stop fluconazole.  Differential of fever includes infection, medications (precedex).    Will continue to monitor  I have personally spent 55 minutes involved in face-to-face and non-face-to-face activities for this patient on the day of the visit. Professional time spent includes the following activities: Preparing to see the patient (review of tests), Obtaining and/or reviewing separately obtained history (admission/discharge record), Performing a medically appropriate examination and/or evaluation , Ordering medications/tests/procedures, referring and communicating with other health care professionals, Documenting clinical information in the EMR, Independently interpreting results (not separately reported), Communicating results to the patient/family/caregiver, Counseling and educating the patient/family/caregiver and Care coordination (not separately reported).

## 2023-02-16 NOTE — Progress Notes (Signed)
Post filter Ionized calcium 0.38 Systemic Ionized calcium 1.10 No changes required to Citrate or Calcium infusion Next levels to be drawn in 8 hours per protocol

## 2023-02-16 NOTE — Procedures (Addendum)
Objective Swallowing Evaluation: Type of Study: FEES-Fiberoptic Endoscopic Evaluation of Swallow   Patient Details  Name: Phillip Young MRN: [id number] Date of Birth: Dec 12, 1985  Today's Date: 02/16/2023 Time: SLP Start Time (ACUTE ONLY): 1213 -SLP Stop Time (ACUTE ONLY): 1310  SLP Time Calculation (min) (ACUTE ONLY): 57 min   Past Medical History:  Past Medical History:  Diagnosis Date   Acute on chronic respiratory failure with hypoxia ([name]) 04/21/2021   Acute on chronic systolic (congestive) heart failure ([name]) 02/26/2020   Amiodarone toxicity    Anemia    Atrial flutter ([name])    Biventricular congestive heart failure ([name])    Chronic hypoxemic respiratory failure ([name])    Class 3 severe obesity due to excess calories with serious comorbidity and body mass index (BMI) of 50.0 to 59.9 in adult ([name]) 02/26/2020   Essential hypertension 02/26/2020   GERD without esophagitis 02/26/2020   Hidradenitis suppurativa 02/26/2020   NICM (nonischemic cardiomyopathy) ([name])    Obesity hypoventilation syndrome ([name])    OSA (obstructive sleep apnea)    PAF (paroxysmal atrial fibrillation) ([name])    Pneumonia    Prediabetes 02/26/2020   Past Surgical History:  Past Surgical History:  Procedure Laterality Date   ABSCESS DRAINAGE     AV FISTULA PLACEMENT Left 08/21/2021   Procedure: LEFT ARM ARTERIOVENOUS (AV) FISTULA.;  Surgeon: Nada Libman, MD;  Location: [facility] OR;  Service: Vascular;  Laterality: Left;   CARDIAC CATHETERIZATION     CARDIOVERSION N/A 10/09/2021   Procedure: CARDIOVERSION;  Surgeon: Laurey Morale, MD;  Location: [facility] ENDOSCOPY;  Service: Cardiovascular;  Laterality: N/A;   CARDIOVERSION N/A 05/28/2022   Procedure: CARDIOVERSION;  Surgeon: Laurey Morale, MD;  Location: [facility] ENDOSCOPY;  Service: Cardiovascular;  Laterality: N/A;   CARDIOVERSION N/A 06/07/2022   Procedure: CARDIOVERSION;  Surgeon: Laurey Morale, MD;  Location: [facility] ENDOSCOPY;  Service: Cardiovascular;   Laterality: N/A;   IR FLUORO GUIDE CV LINE RIGHT  03/10/2021   IR FLUORO GUIDE CV LINE RIGHT  04/22/2021   IR FLUORO GUIDE CV LINE RIGHT  08/20/2021   IR US GUIDE VASC ACCESS RIGHT  03/10/2021   IR US GUIDE VASC ACCESS RIGHT  04/22/2021   RIGHT HEART CATH N/A 03/06/2021   Procedure: RIGHT HEART CATH;  Surgeon: Dolores Patty, MD;  Location: [facility] INVASIVE CV LAB;  Service: Cardiovascular;  Laterality: N/A;   RIGHT/LEFT HEART CATH AND CORONARY ANGIOGRAPHY N/A 03/04/2020   Procedure: RIGHT/LEFT HEART CATH AND CORONARY ANGIOGRAPHY;  Surgeon: Laurey Morale, MD;  Location: [facility] INVASIVE CV LAB;  Service: Cardiovascular;  Laterality: N/A;   TEE WITHOUT CARDIOVERSION N/A 05/05/2021   Procedure: TRANSESOPHAGEAL ECHOCARDIOGRAM (TEE);  Surgeon: Laurey Morale, MD;  Location: [facility] ENDOSCOPY;  Service: Cardiovascular;  Laterality: N/A;   TEMPORARY DIALYSIS CATHETER  03/06/2021   Procedure: TEMPORARY DIALYSIS CATHETER;  Surgeon: Dolores Patty, MD;  Location: [facility] INVASIVE CV LAB;  Service: Cardiovascular;;   HPI: Pt is a 37 year old male who presented with fever, joint pains, SOB. ETT 6/30-7/20. PMH: ESRD, GERD, NICM, refractory AFib, ?amio-induced lung toxicity.   Subjective: Pt awake, alert, pleasant, participative.  Family present for assessment    Recommendations for follow up therapy are one component of a multi-disciplinary discharge planning process, led by the attending physician.  Recommendations may be updated based on patient status, additional functional criteria and insurance authorization.  Assessment / Plan / Recommendation     02/16/2023    1:36 PM  Clinical Impressions  Clinical Impression  Pt presents with a moderate oropharyngeal dysphagia c/b impaired mastication, reduced A-P transit, delayed swallow intiation,decreased base of tongue retraction, incomplete laryngeal closure, and immobile L vocal fold. These deficits resulted in aspiraition of thin liquid by cup.  There was  very shallow penetration of nectar thick liquid over the L aryepiglottic fold on one trial which spontaneously cleared with additional swallow.  There was no other penetration of nectar thick liquid which was given by spoon, cup, and straw.  Pt able to siphon NTL throught straw with effort.  With puree there was delayed swallow intiation and mild-moderate pharyngeal residuals.  With solid trial pt noted with very prolonged mastication and poor A-P transit.  Pt was given puree to help moisten bolus which resulted in oral clearance.  There was moderate pharyngeal residue with this trial.  Pt benefited from NTL wash.   There was no penetration of solid textures.  During evaluation, pt began to mobilize some secretions.  Pt expectorated large pieces of dried secretions and requested suction assistance.  SLP was unable to clear with yankauer while holding endoscope in place, so RN provided suction with oral suction catheter and removed 2 additional large deposits of thick, dried secretions.  Recommend ground/chopped (dysphagia 2) diet with nectar thick liquids.    OF NOTE: Pt does not have nasal septum, with cortrak placed through L nare fully visible to scope passed through R nare.  Mother denies hx of nasal surgeries.  Additionally, given immobility of L VF and present aphonia, pt may benefit from ENT consult when appropriate.          02/16/2023    1:36 PM  Treatment Recommendations  Treatment Recommendations Therapy as outlined in treatment plan below        02/16/2023    1:45 PM  Prognosis  Prognosis for improved oropharyngeal function Good       02/16/2023    1:31 PM  Diet Recommendations  SLP Diet Recommendations Dysphagia 2 (Fine chop) solids;Nectar thick liquid  Liquid Administration via Cup;Straw  Medication Administration Whole meds with puree  Compensations Slow rate;Small sips/bites;Follow solids with liquid  Postural Changes Remain semi-upright after after feeds/meals (Comment)          02/16/2023    1:36 PM  Other Recommendations  Follow Up Recommendations --  Functional Status Assessment Patient has had a recent decline in their functional status and demonstrates the ability to make significant improvements in function in a reasonable and predictable amount of time.       02/16/2023    1:36 PM  Frequency and Duration   Speech Therapy Frequency (ACUTE ONLY) min 2x/week  Treatment Duration 2 weeks         02/16/2023    1:26 PM  Oral Phase  Oral Phase Impaired  Oral - Nectar Teaspoon WFL  Oral - Nectar Cup WFL  Oral - Nectar Straw WFL  Oral - Thin Teaspoon WFL  Oral - Thin Cup WFL  Oral - Puree WFL  Oral - Regular Impaired mastication;Weak lingual manipulation;Reduced posterior propulsion       02/16/2023    1:35 PM  Pharyngeal Phase  Pharyngeal Material does not enter airway;Material enters airway, remains ABOVE vocal cords then ejected out  Pharyngeal- Regular Delayed swallow initiation-vallecula;Inter-arytenoid space residue;Pharyngeal residue - cp segment;Pharyngeal residue - posterior pharnyx;Pharyngeal residue - valleculae;Pharyngeal residue - pyriform        02/16/2023    1:31 PM  Cervical Esophageal Phase   Cervical Esophageal Phase [facility]  Kerrie Pleasure, MA, CCC-SLP Acute Rehabilitation Services Office: [phone] 02/16/2023, 1:46 PM

## 2023-02-16 NOTE — Progress Notes (Signed)
Speech Language Pathology Treatment: Dysphagia  Patient Details Name: Phillip Young MRN: [id number] DOB: October 19, 1985 Today's Date: 02/16/2023 Time: 8416-6063 SLP Time Calculation (min) (ACUTE ONLY): 14 min  Assessment / Plan / Recommendation Clinical Impression  Pt seen for ongoing dysphagia management.  SLP provided oral care prior to administration of PO trials.  Pt tolerated small amounts of water by spoon and ice chips without changes to vitals.  There was intermittent wet vocal quality.  Pt was unable to siphon from straw.  There was immediate wet cough with cup sip of thin liquid, but cough was productive and thick secretions were removed via suction.  Pt is eager for swallowing assessment.  Will plan for FEES later this date.   Recommend pt remain NPO with alternate means of nutrition, hydration, and medication. Pt may have ice chips and small amounts of water by spoon, when vitals are stable, in moderation, after good oral care, when fully awake/alert, with upright positioning and direct supervision.   HPI HPI: Pt is a 37 year old male who presented with fever, joint pains, SOB. ETT 6/30-7/20. PMH: ESRD, GERD, NICM, refractory AFib, ?amio-induced lung toxicity.      SLP Plan   (FEES)      Recommendations for follow up therapy are one component of a multi-disciplinary discharge planning process, led by the attending physician.  Recommendations may be updated based on patient status, additional functional criteria and insurance authorization.    Recommendations  Diet recommendations: NPO Medication Administration: Via alternative means                  Oral care QID;Oral care prior to ice chip/H20   Frequent or constant Supervision/Assistance Dysphagia, unspecified (R13.10)      (FEES)     Kerrie Pleasure, MA, CCC-SLP Acute Rehabilitation Services Office: [phone] 02/16/2023, 11:26 AM

## 2023-02-16 NOTE — Progress Notes (Signed)
Post filter ionized calcium 0.34. Systemic ionized calcium 1.07. No changes necessary per protocol.

## 2023-02-16 NOTE — Progress Notes (Signed)
NAME:  Phillip Young, MRN:  [id number], DOB:  1985-10-07, LOS: 26 ADMISSION DATE:  01/21/2023, CONSULTATION DATE:  01/22/23 REFERRING MD:  Danise Edge, CHIEF COMPLAINT:  SOB   History of Present Illness:  37 year old man well known to the PCCM and AHF services who presented to [facility] 6/28 with fevers, joint pains, SOB. PMHx significant for ESRD, NICM, refractory AFib, ?amio-induced lung toxicity. Patient reportedly missed HD on Thursday (has a history of leaving HD early). Reportedly ~10kg over dry weight with worsening breathing status, AFib/RVR and borderline BP.  PCCM consulted for ICU admission/further evaluation and management.  Pertinent Medical History:   Past Medical History:  Diagnosis Date   Acute on chronic respiratory failure with hypoxia ([name]) 04/21/2021   Acute on chronic systolic (congestive) heart failure ([name]) 02/26/2020   Amiodarone toxicity    Anemia    Atrial flutter ([name])    Biventricular congestive heart failure ([name])    Chronic hypoxemic respiratory failure ([name])    Class 3 severe obesity due to excess calories with serious comorbidity and body mass index (BMI) of 50.0 to 59.9 in adult [facility]) 02/26/2020   Essential hypertension 02/26/2020   GERD without esophagitis 02/26/2020   Hidradenitis suppurativa 02/26/2020   NICM (nonischemic cardiomyopathy) ([name])    Obesity hypoventilation syndrome ([name])    OSA (obstructive sleep apnea)    PAF (paroxysmal atrial fibrillation) ([name])    Pneumonia    Prediabetes 02/26/2020   Significant Hospital Events: Including procedures, antibiotic start and stop dates in addition to other pertinent events   6/28 Admit, emergent HD 6/29 Afib/RVR, shock, ICU transfer 6/30 Intubated and placed on vasopressor support and started on CRRT 7/1 Remains intubated on CRRT continue with pressor support 7/2 Agitated with SAT, remains on CRRT and low dose levophed 7/3 low dose NE, ongoing CRRT 7/4 Attempted SBT this am, apneic but severely agitated,  desaturated, HR up 130/140s.  Re-sedated but HR remains 140's, NE up from 6 to . NSR on EKG.  Was pulling 50-100on UF, since kept even for now.  CVP 8 7/5 DCCV again attempted this a.m. with success, remains on norepinephrine and vasopressin.  7/6 went into A-fib with RVR, attempted DCCV x 3, started on amiodarone infusion after amiodarone bolus 7/7 Multiple DCCV attempts without success. TF held due to vomiting. Tx denied to [facility]  7/8 KUB looked OK. No further vomiting. Tolerating TF. Large BM overnight.  7/9 vasopressors back up, remains rapid afib/ flutter, Bmx 1, wua causing hemodynamic instability 7/10 weaned sedation on mothers request, HR remains 140-220s, tmax 100.6, DNR, no escalation  Interim History / Subjective:  No overnight issues Remains on Precedex currently at 1 mic Continue to require vasopressor support with Levophed at 38 mics and vasopressin 0.04 Still spiking fever with Tmax 101 Blood cultures are pending per ID recommendations  Remain on 8 L nasal cannula oxygen Heart rate raging btw 140-1 60  Objective:  Blood pressure (!) 83/62, pulse (!) 141, temperature 98.2 F (36.8 C), resp. rate (!) 28, height 6' (1.829 m), weight (!) 168.2 kg, SpO2 100%. CVP:  [3 mmHg-12 mmHg] 8 mmHg      Intake/Output Summary (Last 24 hours) at 02/16/2023 0924 Last data filed at 02/16/2023 0800 Gross per 24 hour  Intake 7297.41 ml  Output 194.3 ml  Net 7103.11 ml   Filed Weights   02/14/23 0100 02/15/23 0545 02/16/23 0615  Weight: (!) 164.9 kg (!) 162.8 kg (!) 168.2 kg   Physical Examination: General: Chronically ill-appearing morbidly obese  male, lying on the bed HEENT: [place]/AT, eyes anicteric.  moist mucus membranes.  With hoarseness of voice Neuro: Alert, awake following commands.  Generalized weak Chest: Coarse breath sounds, no wheezes or rhonchi Heart: Irregularly irregular, tachycardic Abdomen: Soft, nontender, nondistended, bowel sounds present Skin: No rash  Labs  and images were reviewed   Resolved Hospital Problem List:  Hematuria due to traumatic Foley insertion Pneumococcal bacteremia  Assessment & Plan:  Acute-on-chronic hypoxemic respiratory failure Bilateral multifocal pneumonia- initially strep and superimposed HCAP OHS/OSA, noncompliant with CPAP Acute on chronic HFrEF with pulmonary edema Paroxysmal rapid A-fib/flutter with RVR,  refractory, with previous amio induced lung and liver toxicity (05/2022) Nonischemic cardiomyopathy Mixed shock septic/cardiogenic End-stage renal disease on hemodialysis Hypervolemic hyponatremia Hyperphosphatemia Hyperkalemia  High anion gap metabolic acidosis Hypocalcemia  Patient's oxygen requirement is slowly going up, currently on 8 L He completed 10 days course of Zyvox and meropenem now continued to spike fever since we stopped antibiotic, last night his Tmax was 101 Respiratory culture is growing yeast Continue fluconazole ID consulted, recommend doing blood cultures, results pending Remain in A-fib with RVR, refractory to treatment, heart rate ranging between 140-160 Unfortunately A-fib is refractory to any treatment and electrical cardioversion Conitnue Midodrine at 30 tid Continue to require increasing doses of vasopressor support, currently at 38 mics of Levophed and vasopressin of 0.04 Continue CRRT, unable to pull some volume due to increasing vasopressor requirement Titrate Precedex with RASS goal 0/-1, his fevers can be due to Precedex but unfortunately he gets really agitated Monitor electrolytes Coox is 58%  Overall poor prognosis  Best Practice (right click and "Reselect all SmartList Selections" daily)   Diet/type: Start trickle tube feeds DVT prophylaxis: DOAC GI prophylaxis: Famotidine Lines: Yes still needed Foley: n/a Code Status:  Full Last date of multidisciplinary goals of care: 02/15/23: [name] was updated at bedside in detail  The patient is critically  ill due to Shock/Acute respiratory failure.  Critical care was necessary to treat or prevent imminent or life-threatening deterioration.  Critical care was time spent personally by me on the following activities: development of treatment plan with patient and/or surrogate as well as nursing, discussions with consultants, evaluation of patient's response to treatment, examination of patient, obtaining history from patient or surrogate, ordering and performing treatments and interventions, ordering and review of laboratory studies, ordering and review of radiographic studies, pulse oximetry, re-evaluation of patient's condition and participation in multidisciplinary rounds.   During this encounter critical care time was devoted to patient care services described in this note for 34 minutes.     Cheri Fowler, MD [facility] See Amion for pager If no response to pager, please call [phone] until 7pm After 7pm, Please call E-link [phone]

## 2023-02-16 NOTE — Progress Notes (Signed)
eLink Physician-Brief Progress Note Patient Name: Ethanael Veith DOB: October 15, 1985 MRN: [id number]   Date of Service  02/16/2023  HPI/Events of Note  37 year old man well known to the PCCM and AHF services who presented to [place] [facility] 6/28 with fevers, joint pains, SOB. PMHx significant for ESRD, NICM, refractory AFib, ?amio-induced lung toxicity.  ICU day 25.  Patient is now having shock of unclear etiology, near maximum on vasopressin and norepinephrine.  Permanent A-fib with rates up to 180s.  Saturating appropriately, but evidence of acidosis earlier in the day.  This is despite being on continuous renal replacement therapy.  eICU Interventions  Will obtain ABG now  Start bicarb infusion  Will likely need epinephrine   0014 -ABG reviewed and fairly unremarkable.  Hold on the bicarb infusion.  Initiate the epi infusion.  Will order repeat limited echo.  Debating whether there is a component of cardiogenic shock with his A-fib.    Intervention Category Intermediate Interventions: Hypotension - evaluation and management  Teigan Sahli 02/16/2023, 11:33 PM

## 2023-02-17 ENCOUNTER — Telehealth: Payer: Self-pay

## 2023-02-17 ENCOUNTER — Inpatient Hospital Stay (HOSPITAL_COMMUNITY): Payer: BC Managed Care – PPO

## 2023-02-17 DIAGNOSIS — I4891 Unspecified atrial fibrillation: Secondary | ICD-10-CM | POA: Diagnosis not present

## 2023-02-17 DIAGNOSIS — R570 Cardiogenic shock: Secondary | ICD-10-CM | POA: Diagnosis not present

## 2023-02-17 DIAGNOSIS — J9601 Acute respiratory failure with hypoxia: Secondary | ICD-10-CM | POA: Diagnosis not present

## 2023-02-17 LAB — POCT I-STAT EG7
Acid-base deficit: 6 mmol/L — ABNORMAL HIGH (ref 0.0–2.0)
Acid-base deficit: 6 mmol/L — ABNORMAL HIGH (ref 0.0–2.0)
Bicarbonate: 22.2 mmol/L (ref 20.0–28.0)
Bicarbonate: 22.2 mmol/L (ref 20.0–28.0)
Calcium, Ion: 0.37 mmol/L — CL (ref 1.15–1.40)
Calcium, Ion: 0.37 mmol/L — CL (ref 1.15–1.40)
HCT: 34 % — ABNORMAL LOW (ref 39.0–52.0)
HCT: 31 % — ABNORMAL LOW (ref 39.0–52.0)
Hemoglobin: 11.6 g/dL — ABNORMAL LOW (ref 13.0–17.0)
Hemoglobin: 10.5 g/dL — ABNORMAL LOW (ref 13.0–17.0)
O2 Saturation: 38 %
O2 Saturation: 40 %
Potassium: 4.3 mmol/L (ref 3.5–5.1)
Potassium: 4.1 mmol/L (ref 3.5–5.1)
Sodium: 137 mmol/L (ref 135–145)
Sodium: 136 mmol/L (ref 135–145)
TCO2: 24 mmol/L (ref 22–32)
TCO2: 24 mmol/L (ref 22–32)
pCO2, Ven: 53.3 mmHg (ref 44–60)
pCO2, Ven: 54.7 mmHg (ref 44–60)
pH, Ven: 7.229 — ABNORMAL LOW (ref 7.25–7.43)
pH, Ven: 7.216 — ABNORMAL LOW (ref 7.25–7.43)
pO2, Ven: 27 mmHg — CL (ref 32–45)
pO2, Ven: 28 mmHg — CL (ref 32–45)

## 2023-02-17 LAB — RENAL FUNCTION PANEL
Albumin: 1.9 g/dL — ABNORMAL LOW (ref 3.5–5.0)
Anion gap: 19 — ABNORMAL HIGH (ref 5–15)
Anion gap: 20 — ABNORMAL HIGH (ref 5–15)
BUN: 60 mg/dL — ABNORMAL HIGH (ref 6–20)
BUN: 51 mg/dL — ABNORMAL HIGH (ref 6–20)
CO2: 21 mmol/L — ABNORMAL LOW (ref 22–32)
CO2: 20 mmol/L — ABNORMAL LOW (ref 22–32)
Calcium: 9.7 mg/dL (ref 8.9–10.3)
Calcium: 9.5 mg/dL (ref 8.9–10.3)
Chloride: 96 mmol/L — ABNORMAL LOW (ref 98–111)
Chloride: 95 mmol/L — ABNORMAL LOW (ref 98–111)
Creatinine, Ser: 2.18 mg/dL — ABNORMAL HIGH (ref 0.61–1.24)
Creatinine, Ser: 2.2 mg/dL — ABNORMAL HIGH (ref 0.61–1.24)
GFR, Estimated: 39 mL/min — ABNORMAL LOW (ref >60)
GFR, Estimated: 39 mL/min — ABNORMAL LOW (ref >60)
Glucose, Bld: 139 mg/dL — ABNORMAL HIGH (ref 70–99)
Glucose, Bld: 121 mg/dL — ABNORMAL HIGH (ref 70–99)
Phosphorus: 3.6 mg/dL (ref 2.5–4.6)
Phosphorus: 2.9 mg/dL (ref 2.5–4.6)
Potassium: 4.2 mmol/L (ref 3.5–5.1)
Potassium: 4.2 mmol/L (ref 3.5–5.1)
Sodium: 136 mmol/L (ref 135–145)
Sodium: 135 mmol/L (ref 135–145)

## 2023-02-17 LAB — POCT I-STAT 7, (LYTES, BLD GAS, ICA,H+H)
Acid-base deficit: 4 mmol/L — ABNORMAL HIGH (ref 0.0–2.0)
Acid-base deficit: 5 mmol/L — ABNORMAL HIGH (ref 0.0–2.0)
Bicarbonate: 19.6 mmol/L — ABNORMAL LOW (ref 20.0–28.0)
Bicarbonate: 18.8 mmol/L — ABNORMAL LOW (ref 20.0–28.0)
Calcium, Ion: 1.04 mmol/L — ABNORMAL LOW (ref 1.15–1.40)
Calcium, Ion: 1.09 mmol/L — ABNORMAL LOW (ref 1.15–1.40)
HCT: 28 % — ABNORMAL LOW (ref 39.0–52.0)
HCT: 27 % — ABNORMAL LOW (ref 39.0–52.0)
Hemoglobin: 9.5 g/dL — ABNORMAL LOW (ref 13.0–17.0)
Hemoglobin: 9.2 g/dL — ABNORMAL LOW (ref 13.0–17.0)
O2 Saturation: 90 %
O2 Saturation: 96 %
Patient temperature: 37
Potassium: 4.2 mmol/L (ref 3.5–5.1)
Potassium: 3.7 mmol/L (ref 3.5–5.1)
Sodium: 134 mmol/L — ABNORMAL LOW (ref 135–145)
Sodium: 133 mmol/L — ABNORMAL LOW (ref 135–145)
TCO2: 20 mmol/L — ABNORMAL LOW (ref 22–32)
TCO2: 20 mmol/L — ABNORMAL LOW (ref 22–32)
pCO2 arterial: 30.5 mmHg — ABNORMAL LOW (ref 32–48)
pCO2 arterial: 29.5 mmHg — ABNORMAL LOW (ref 32–48)
pH, Arterial: 7.415 (ref 7.35–7.45)
pH, Arterial: 7.413 (ref 7.35–7.45)
pO2, Arterial: 58 mmHg — ABNORMAL LOW (ref 83–108)
pO2, Arterial: 79 mmHg — ABNORMAL LOW (ref 83–108)

## 2023-02-17 LAB — CULTURE, BLOOD (ROUTINE X 2): Culture: NO GROWTH

## 2023-02-17 LAB — MAGNESIUM: Magnesium: 2 mg/dL (ref 1.7–2.4)

## 2023-02-17 LAB — GLUCOSE, CAPILLARY
Glucose-Capillary: 106 mg/dL — ABNORMAL HIGH (ref 70–99)
Glucose-Capillary: 123 mg/dL — ABNORMAL HIGH (ref 70–99)
Glucose-Capillary: 159 mg/dL — ABNORMAL HIGH (ref 70–99)
Glucose-Capillary: 165 mg/dL — ABNORMAL HIGH (ref 70–99)
Glucose-Capillary: 176 mg/dL — ABNORMAL HIGH (ref 70–99)
Glucose-Capillary: 95 mg/dL (ref 70–99)

## 2023-02-17 LAB — COOXEMETRY PANEL
Carboxyhemoglobin: 1.9 % — ABNORMAL HIGH (ref 0.5–1.5)
Methemoglobin: 0.8 % (ref 0.0–1.5)
O2 Saturation: 55.1 %
Total hemoglobin: 10 g/dL — ABNORMAL LOW (ref 12.0–16.0)

## 2023-02-17 LAB — CBC
HCT: 29.9 % — ABNORMAL LOW (ref 39.0–52.0)
Hemoglobin: 9.5 g/dL — ABNORMAL LOW (ref 13.0–17.0)
MCH: 33.3 pg (ref 26.0–34.0)
MCHC: 31.8 g/dL (ref 30.0–36.0)
MCV: 104.9 fL — ABNORMAL HIGH (ref 80.0–100.0)
Platelets: 230 10*3/uL (ref 150–400)
RBC: 2.85 MIL/uL — ABNORMAL LOW (ref 4.22–5.81)
RDW: 18.6 % — ABNORMAL HIGH (ref 11.5–15.5)
WBC: 13.2 10*3/uL — ABNORMAL HIGH (ref 4.0–10.5)
nRBC: 0.2 % (ref 0.0–0.2)

## 2023-02-17 NOTE — Progress Notes (Signed)
Systemic ionized calcium 1.04. Post filter ionized calcium 0.37. No changes necessary per protocol.

## 2023-02-17 NOTE — Progress Notes (Signed)
[facility] PROGRESS NOTE  Subjective:  Seen and examined in ICU. Levo gtt up at 35- 50 mcg/min, vaso at 0.04. required some epi gtt overnight as well. Total I/O was 4 L + yesterday. Wt's back up to 173kg.    Objective Vital signs in last 24 hours: Vitals:   02/14/23 1600 02/14/23 1615 02/14/23 1630 02/14/23 1645  BP:      Pulse:      Resp: (!) 43 (!) 38 (!) 35 (!) 31  Temp: 98.1 F (36.7 C)     TempSrc: Axillary     SpO2: 100%     Weight:      Height:         Physical Exam: Critically ill looking male, looks uncomfortable, extubated. Heart:RRR, s1s2 nl Lungs:coarse breath sound b/l Abdomen: soft, +bs Extremities: 1+ bilat hip edema  Dialysis Access: RIJ TDC, site looks clean.   OP HD: [facility] TTS  4h   180kg  425/1.5  3K/2.5Ca bath  TDC     Assessment/ Plan:  # Hypotension - worsened again overnight and pressors remain at high doses (levo 30- 50, vaso 0.04). On midodrine 30 tid.   # Septic shock/ pneumococcal bacteremia: on admission strep pneumoniae grew on initial BCx's (6/28) w/ presumed PNA. Was on rocephin initially then changed to meropenem and linezolid which were completed on 7/18. New fevers this past week, antifungal was started, and now stopped. IV daptomycin started yesterday and fevers better today. ID following.   # Acute hypoxic and hypercapnic respiratory failure: patient is now extubated. O2 requirement worsening up to 15 L today.  # ESRD: Required emergent HD on 6/29 because of fluid overload.  Then developed shock and afib/ RVR requiring high doses of pressors.  Started CRRT on 6/29. CRRT was stopped briefly 7/11 then resumed 7/12. Unable to d/c CRRT due to serious hypotension on pressor support. Cont CRRT.   # Volume - admit wts were 182- 187kg (dry wt 180kg). After 1st two weeks of CRRT wts settled in around 171- 175kg. Around 7/17 we started pulling fluid again and wts' dropped to 163kg at nadir. Then pt was severe  hypotensive episode and is now back up 168kg yest and 173kg today. CVP's up at 15- 24 today. CXR 7/22 is poor film, no obvious edema. Continue to attempt to "keep even", although most hours this is not possible and he will be +.   # CRRT anticoagulation - pt required change to citrate protocol for frequent clotting started 7/06.  We continue using citrate protocol due to excessive clotting and intolerance to heparin.   # Hypercalcemia: Noted patient has high corrected calcium level of around 11.4, but stable.  Ionized Ca++ is 1.10 wnl.  He is on citrate protocol with calcium gluconate, and we are using 0 Ca fluids w/ his post HD IVF's and with the dialysate. Continue to monitor.   # Acute on chronic systolic CHF, nonischemic: EF 91-47%.  # Anemia of CKD: Hemoglobin at goal.  Received Aranesp on 7/1 and 7/08. Hb has now been high > 12 for last 2 weeks. Follow.   #A-fib with RVR with hypotension: S/P cardioversion 7/5 -> NSR -> afib RVR 7/6 early AM refractory to [facility] x3 + amiodarone. Persistent afib w/ HR 140-180.    Vinson Moselle  MD  [facility] 02/17/2023, 2:47 PM  Recent Labs  Lab 02/16/23 1639 02/16/23 2346 02/16/23 2349 02/17/23 0500 02/17/23 0522  HGB  --    < >  11.6*  --  9.5*  ALBUMIN 2.1*  --   --  2.0*  --   CALCIUM 9.3  --   --  9.7  --   PHOS 4.0  --   --  3.6  --   CREATININE 2.30*  --  1.50* 2.18*  --   K 3.9   < > 3.8 4.2  --    < > = values in this interval not displayed.    Inpatient medications:  apixaban  5 mg Per Tube BID   Chlorhexidine Gluconate Cloth  6 each Topical Q0600   famotidine  20 mg Per Tube Daily   feeding supplement (PROSource TF20)  60 mL Per Tube Q4H   fentaNYL (SUBLIMAZE) injection  50 mcg Intravenous Once   fiber supplement (BANATROL TF)  60 mL Per Tube BID   Gerhardt's butt cream   Topical Daily   hydrOXYzine  10 mg Per Tube TID   insulin aspart  0-9 Units Subcutaneous Q4H   insulin aspart  2 Units Subcutaneous Q4H   insulin glargine-yfgn  12  Units Subcutaneous Daily   lidocaine  1 patch Transdermal Q24H   melatonin  5 mg Per Tube QHS   midodrine  30 mg Oral TID WC   multivitamin  1 tablet Per Tube QHS   nutrition supplement (JUVEN)  1 packet Per Tube BID BM   mouth rinse  15 mL Mouth Rinse 4 times per day   QUEtiapine  50 mg Per Tube BID   sodium chloride flush  10-40 mL Intravenous Q12H    sodium chloride Stopped (01/22/23 1859)   anticoagulant sodium citrate     calcium gluconate 20 g in dextrose 5 % 1,000 mL infusion 60 mL/hr at 02/17/23 1400   citrate dextrose 280 mL/hr at 02/17/23 1400   DAPTOmycin (CUBICIN) 900 mg in sodium chloride 0.9 % IVPB Stopped (02/17/23 1345)   dexmedetomidine (PRECEDEX) IV infusion 1 mcg/kg/hr (02/17/23 1400)   feeding supplement (VITAL 1.5 CAL) 50 mL/hr at 02/17/23 1400   norepinephrine (LEVOPHED) Adult infusion 48 mcg/min (02/17/23 1400)   prismasol B22GK 4/0 1,500 mL/hr at 02/17/23 1310   prismasol B22GK 4/0 600 mL/hr at 02/17/23 1332   vasopressin 0.04 Units/min (02/17/23 1400)   sodium chloride, acetaminophen **OR** acetaminophen, anticoagulant sodium citrate, bisacodyl, docusate, fentaNYL (SUBLIMAZE) injection, ondansetron (ZOFRAN) IV, mouth rinse, polyethylene glycol, polyvinyl alcohol, senna-docusate, sodium chloride flush, surgical lubricant

## 2023-02-17 NOTE — Progress Notes (Signed)
Pt placed on BIPAP and is tolerating well at this time. Rt will monitor as needed.     02/17/23 2155  Vent Select  $ Ventilator Initial/Subsequent  Subsequent  Invasive or Noninvasive Noninvasive  Adult Vent Y  Adult Ventilator Settings  Vent Type Servo i  Humidity HME  Vent Mode BIPAP  Set Rate 8 bmp  FiO2 (%) 40 %  I Time 0.9 Sec(s)  Pressure Control 0 cmH20  PEEP 5 cmH20  Adult Ventilator Measurements  Peak Airway Pressure 13 L/min  Resp Rate Spontaneous 33 br/min  Spont TV 635 mL  Measured Ve 18.1 L  Auto PEEP 0 cmH20  Total PEEP 5 cmH20  Adult Ventilator Alarms  Alarms On Y  Ve High Alarm 31 L/min  Ve Low Alarm 4 L/min  Resp Rate High Alarm 44 br/min  Resp Rate Low Alarm 10  PEEP Low Alarm 3 cmH2O  Press High Alarm 25 cmH2O  T Apnea 20 sec(s)  VAP Prevention  HOB> 30 Degrees Y  Breath Sounds  Bilateral Breath Sounds Clear;Diminished  R Upper  Breath Sounds Clear  L Upper Breath Sounds Clear  R Lower Breath Sounds Diminished  L Lower Breath Sounds Diminished  Vent Respiratory Assessment  Respiratory Pattern Labored;Symmetrical

## 2023-02-17 NOTE — Progress Notes (Signed)
[facility]  Date of Admission:  01/21/2023     Total days of antibiotics 25         ASSESSMENT:  Mr. Conry is having increased vasopressor needs overnight and has been afebrile over the past 24 hours with improving leukocytosis. Started on daptomycin and would suspect one dose of medication to have little benefit. No current clear sources of infection and does have a tunneled HD catheter and left internal jugular central line that may need to be considered although he is currently requiring vasopressors and CRRT. Continue current dose of daptomycin for now. Monitor CK levels and for eosinophilic pneumonia for therapeutic drug monitoring.  Remaining medical and supportive care per CCM.   PLAN:  Continue current dose of daptomycin.  Monitor fever curve and blood cultures.  Consider line holiday if able and fevers continue Remaining medical and supportive care per CCM.   Principal Problem:   Acute hypoxic respiratory failure ([name]) Active Problems:   Morbid obesity ([name])   Chronic systolic CHF (congestive heart failure) ([name])   Cardiogenic shock ([name])   ESRD (end stage renal disease) ([name])   ESRD (end stage renal disease) on dialysis ([name])   Hypervolemia   Septic shock ([name])   Atrial fibrillation with RVR ([name])   Pressure injury of skin    apixaban  5 mg Per Tube BID   Chlorhexidine Gluconate Cloth  6 each Topical Q0600   famotidine  20 mg Per Tube Daily   feeding supplement (PROSource TF20)  60 mL Per Tube Q4H   fentaNYL (SUBLIMAZE) injection  50 mcg Intravenous Once   fiber supplement (BANATROL TF)  60 mL Per Tube BID   Gerhardt's butt cream   Topical Daily   hydrOXYzine  10 mg Per Tube TID   insulin aspart  0-9 Units Subcutaneous Q4H   insulin aspart  2 Units Subcutaneous Q4H   insulin glargine-yfgn  12 Units Subcutaneous Daily   lidocaine  1 patch Transdermal Q24H   melatonin  5 mg Per Tube QHS   midodrine  30 mg Oral TID WC    multivitamin  1 tablet Per Tube QHS   nutrition supplement (JUVEN)  1 packet Per Tube BID BM   mouth rinse  15 mL Mouth Rinse 4 times per day   QUEtiapine  50 mg Per Tube BID   sodium chloride flush  10-40 mL Intravenous Q12H    SUBJECTIVE:  Afebrile overnight with concern for shock of unclear origin overnight. Having shortness of breath.   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  Respiratory:  Positive for shortness of breath. Negative for cough and wheezing.   Cardiovascular:  Negative for chest pain and leg swelling.  Gastrointestinal:  Negative for abdominal pain, constipation, diarrhea, nausea and vomiting.  Skin:  Negative for rash.      OBJECTIVE: Vitals:   02/17/23 0845 02/17/23 0900  02/17/23 1000 02/17/23 1100  BP:      Pulse: (!) 169 (!) 170 (!) 167 (!) 155  Resp: (!) 40 (!) 33 (!) 32 (!) 35  Temp:      TempSrc:      SpO2: 99% 94% 97% 100%  Weight:      Height:       Body mass index is 51.97 kg/m.  Physical Exam Constitutional:      General: He is not in acute distress.    Appearance: He is well-developed. He is obese. He is ill-appearing.  Cardiovascular:     Rate and Rhythm: Tachycardia present. Rhythm irregularly irregular.     Heart sounds: Normal heart sounds.  Pulmonary:     Effort: Pulmonary effort is normal.     Breath sounds: Normal breath sounds.  Skin:    General: Skin is warm.  Neurological:     Mental Status: He is alert and  oriented to person, place, and time.  Psychiatric:        Behavior: Behavior normal.        Thought Content: Thought content normal.        Judgment: Judgment normal.     Lab Results Lab Results  Component Value Date   WBC 13.2 (H) 02/17/2023   HGB 9.5 (L) 02/17/2023   HCT 29.9 (L) 02/17/2023   MCV 104.9 (H) 02/17/2023   PLT 230 02/17/2023    Lab Results  Component Value Date   CREATININE 2.18 (H) 02/17/2023   BUN 60 (H) 02/17/2023   NA 136 02/17/2023   K 4.2 02/17/2023   CL 96 (L) 02/17/2023   CO2 21 (L) 02/17/2023    Lab Results  Component Value Date   ALT 11 01/21/2023   AST 18 01/21/2023   ALKPHOS 97 01/21/2023   BILITOT 2.3 (H) 01/21/2023     Microbiology: Recent Results (from the past 240 hour(s))  Culture, Respiratory w Gram Stain     Status: None   Collection Time: 02/10/23  8:50 AM   Specimen: Tracheal Aspirate; Respiratory  Result Value Ref Range Status   Specimen Description TRACHEAL ASPIRATE  Final   Special Requests NONE  Final   Gram Stain   Final    FEW WBC PRESENT, PREDOMINANTLY PMN MODERATE YEAST Performed at [facility], [address] [place], [place] [address]    Culture   Final    MODERATE CANDIDA DUBLINIENSIS MODERATE CANDIDA ALBICANS    Report Status 02/12/2023 FINAL  Final  Culture, blood (Routine X 2) w Reflex to ID Panel     Status: None (Preliminary result)   Collection Time: 02/15/23  2:52 PM   Specimen: BLOOD LEFT HAND  Result Value Ref Range Status   Specimen Description BLOOD LEFT HAND  Final   Special Requests   Final    BOTTLES DRAWN AEROBIC ONLY Blood Culture results may not be optimal due to an inadequate volume of blood received in culture bottles   Culture   Final    NO GROWTH 2 DAYS Performed at [facility], [address] [place], [place] [address]    Report Status PENDING  Incomplete  Culture, blood (Routine X 2) w Reflex to ID Panel     Status: None (Preliminary result)   Collection Time:  02/15/23  2:59 PM   Specimen: BLOOD RIGHT HAND  Result Value Ref Range Status   Specimen Description BLOOD RIGHT HAND  Final   Special Requests   Final  BOTTLES DRAWN AEROBIC ONLY Blood Culture results may not be optimal due to an inadequate volume of blood received in culture bottles   Culture   Final    NO GROWTH 2 DAYS Performed at [facility], [address], [place], [place] [address]    Report Status PENDING  Incomplete     Marcos Eke, NP [facility]  02/17/2023  11:26 AM

## 2023-02-17 NOTE — Progress Notes (Signed)
NAME:  Phillip Young, MRN:  [id number], DOB:  01-Oct-1985, LOS: 27 ADMISSION DATE:  01/21/2023, CONSULTATION DATE:  01/22/23 REFERRING MD:  Danise Edge, CHIEF COMPLAINT:  SOB   History of Present Illness:  37 year old man well known to the PCCM and AHF services who presented to [facility] 6/28 with fevers, joint pains, SOB. PMHx significant for ESRD, NICM, refractory AFib, ?amio-induced lung toxicity. Patient reportedly missed HD on Thursday (has a history of leaving HD early). Reportedly ~10kg over dry weight with worsening breathing status, AFib/RVR and borderline BP.  PCCM consulted for ICU admission/further evaluation and management.  Pertinent Medical History:   Past Medical History:  Diagnosis Date   Acute on chronic respiratory failure with hypoxia ([name]) 04/21/2021   Acute on chronic systolic (congestive) heart failure ([name]) 02/26/2020   Amiodarone toxicity    Anemia    Atrial flutter ([name])    Biventricular congestive heart failure ([name])    Chronic hypoxemic respiratory failure ([name])    Class 3 severe obesity due to excess calories with serious comorbidity and body mass index (BMI) of 50.0 to 59.9 in adult [facility]) 02/26/2020   Essential hypertension 02/26/2020   GERD without esophagitis 02/26/2020   Hidradenitis suppurativa 02/26/2020   NICM (nonischemic cardiomyopathy) ([name])    Obesity hypoventilation syndrome ([name])    OSA (obstructive sleep apnea)    PAF (paroxysmal atrial fibrillation) ([name])    Pneumonia    Prediabetes 02/26/2020   Significant Hospital Events: Including procedures, antibiotic start and stop dates in addition to other pertinent events   6/28 Admit, emergent HD 6/29 Afib/RVR, shock, ICU transfer 6/30 Intubated and placed on vasopressor support and started on CRRT 7/1 Remains intubated on CRRT continue with pressor support 7/2 Agitated with SAT, remains on CRRT and low dose levophed 7/3 low dose NE, ongoing CRRT 7/4 Attempted SBT this am, apneic but severely agitated,  desaturated, HR up 130/140s.  Re-sedated but HR remains 140's, NE up from 6 to . NSR on EKG.  Was pulling 50-100on UF, since kept even for now.  CVP 8 7/5 DCCV again attempted this a.m. with success, remains on norepinephrine and vasopressin.  7/6 went into A-fib with RVR, attempted DCCV x 3, started on amiodarone infusion after amiodarone bolus 7/7 Multiple DCCV attempts without success. TF held due to vomiting. Tx denied to [facility]  7/8 KUB looked OK. No further vomiting. Tolerating TF. Large BM overnight.  7/9 vasopressors back up, remains rapid afib/ flutter, Bmx 1, wua causing hemodynamic instability 7/10 weaned sedation on mothers request, HR remains 140-220s, tmax 100.6, DNR, no escalation  Interim History / Subjective:  Overnight patient became more hypotensive, on Levophed was increased to 50, remains on vasopressin Epinephrine was added He received multiple boluses of sodium bicarbonate Epinephrine was discontinued this morning, continue to remains on 50 mics of Levophed and vasopressin Became afebrile after starting daptomycin Remains on Precedex currently at 1.2 mic Heart rate is still remain in the 170s  Objective:  Blood pressure (!) 85/64, pulse (!) 155, temperature 98.6 F (37 C), resp. rate (!) 35, height 6' (1.829 m), weight (!) 173.8 kg, SpO2 100%. CVP:  [0 mmHg-25 mmHg] 19 mmHg      Intake/Output Summary (Last 24 hours) at 02/17/2023 1126 Last data filed at 02/17/2023 1100 Gross per 24 hour  Intake 4336.49 ml  Output 38 ml  Net 4298.49 ml   Filed Weights   02/15/23 0545 02/16/23 0615 02/17/23 0400  Weight: (!) 162.8 kg (!) 168.2 kg (!) 173.8 kg  Physical Examination: General: [name], lying on the bed HEENT: [place]/AT, eyes anicteric.  moist mucus membranes Neuro: Awake, following commands, confused Chest: Tachypneic, coarse breath sounds, no wheezes or rhonchi Heart: Irregularly irregular, tachycardic, no murmurs or  gallops Abdomen: Soft, nontender, nondistended, bowel sounds present Skin: No rash   Labs and images were reviewed   Resolved Hospital Problem List:  Hematuria due to traumatic Foley insertion Pneumococcal bacteremia  Assessment & Plan:  Acute-on-chronic hypoxemic respiratory failure Bilateral multifocal pneumonia- initially strep and superimposed HCAP OHS/OSA, noncompliant with CPAP Acute on chronic HFrEF with pulmonary edema Paroxysmal rapid A-fib/flutter with RVR,  refractory, with previous amio induced lung and liver toxicity (05/2022) Nonischemic cardiomyopathy Mixed shock septic/cardiogenic End-stage renal disease on hemodialysis Recurrent metabolic acidosis Hypervolemic hyponatremia Hyperphosphatemia Hyperkalemia  Hypocalcemia  Patient's oxygen requirement is slowly going up, currently on 15 L Became afebrile after starting daptomycin Blood cultures remain negative Fluconazole was stopped per ID recommendations ID is following Remain in A-fib with RVR, refractory to treatment, heart rate ranging between 140-180 Unfortunately A-fib is refractory to any treatment and electrical cardioversion Conitnue Midodrine at 30 tid Continue to require increasing vasopressors, currently on 50 mics of Levophed and vasopressin Overnight required epinephrine which was titrated off He also required sodium bicarbonate pushes despite being on CRRT Unable to pull volume due to hypotension Titrate Precedex with RASS goal 0/-1,  Monitor electrolytes Coox is 58% Continue aggressive electrolyte replacement  Overall poor prognosis  Best Practice (right click and "Reselect all SmartList Selections" daily)   Diet/type: Start trickle tube feeds DVT prophylaxis: DOAC GI prophylaxis: Famotidine Lines: Yes still needed Foley: n/a Code Status:  Full Last date of multidisciplinary goals of care: 02/15/23: [name] was updated at bedside in detail  The patient is critically ill due  to Shock/Acute respiratory failure.  Critical care was necessary to treat or prevent imminent or life-threatening deterioration.  Critical care was time spent personally by me on the following activities: development of treatment plan with patient and/or surrogate as well as nursing, discussions with consultants, evaluation of patient's response to treatment, examination of patient, obtaining history from patient or surrogate, ordering and performing treatments and interventions, ordering and review of laboratory studies, ordering and review of radiographic studies, pulse oximetry, re-evaluation of patient's condition and participation in multidisciplinary rounds.   During this encounter critical care time was devoted to patient care services described in this note for 33 minutes.     Cheri Fowler, MD [facility] See Amion for pager If no response to pager, please call [phone] until 7pm After 7pm, Please call E-link [phone]

## 2023-02-17 NOTE — Progress Notes (Signed)
Systemic ionized calcium 1.09. Post filter ionized calcium 0.37. No changes necessary per protocol.

## 2023-02-17 NOTE — Plan of Care (Signed)

## 2023-02-17 NOTE — Patient Outreach (Signed)
  Care Coordination   02/17/2023 Name: Phillip Young MRN: [id number] DOB: 1985/11/03   Care Coordination Outreach Attempts:  An unsuccessful telephone outreach was attempted today to offer the patient information about available care coordination services.  Follow Up Plan:  Additional outreach attempts will be made to offer the patient care coordination information and services.   Encounter Outcome:  No Answer   Care Coordination Interventions:  No, not indicated    Juanell Fairly RN, BSN, [facility] Care Coordinator [facility]   Phone: [phone]

## 2023-02-17 NOTE — Progress Notes (Signed)
Pt has now taken himself off bipap, RT attempted to place pt back on machine pt stated he did not want to go back on right now. PT placed on 15L [place].

## 2023-02-17 NOTE — Progress Notes (Signed)
RT responded to bipap machine alarming.  Upon arrival, patient noted to have ripped bipap machine off.  Patient currently trying to take oxygen off as well.  MD aware.  Will continue to monitor.

## 2023-02-17 NOTE — Progress Notes (Addendum)
Speech Language Pathology Treatment: Dysphagia  Patient Details Name: Phillip Young MRN: [id number] DOB: Jul 14, 1986 Today's Date: 02/17/2023 Time: 8295-6213 SLP Time Calculation (min) (ACUTE ONLY): 32 min  Assessment / Plan / Recommendation Clinical Impression  Pt seen for ongoing dysphagia management.  RN reports good tolerance of PO diet.  Pt eating 20-30% of meals yesterday, but had not wanted to eat this morning.  AM mean tray in room.  Pt accepted a few bites and sips of chopped/ground diet and nectar thick by straw, but intake was minimal.  Pt exhibited excellent tolerance of POs.  There were no overt s/s of aspiration with any consistency trialed.  [name] vitals remain labile (tachypnea, tachycardia), but there was no change with PO trials.  [name] vocal quality is somewhat improved today as well.  His voice is very dysphonic, but he was able to attain minimal phonation and cough strength seems to be improving as well. He needed extra time for oral clearance and benefited from liquid wash.  Pt was having discomfort with upright positioning asked for head of bed to be laid down.  Pt given verbal cue to swallow remainder of food in oral cavity prior to adjusting head of bed, which he was able to do.  He was left lying down with tube feeds on hold.  Related to absence of nasal septum, pt denies any use of cocaine or other drugs inhaled nasally.  Recommend continuing chopped/ground diet with nectar thick liquids.     HPI HPI: Pt is a 37 year old male who presented with fever, joint pains, SOB. ETT 6/30-7/20. PMH: ESRD, GERD, NICM, refractory AFib, ?amio-induced lung toxicity.      SLP Plan  Continue with current plan of care      Recommendations for follow up therapy are one component of a multi-disciplinary discharge planning process, led by the attending physician.  Recommendations may be updated based on patient status, additional functional criteria and insurance authorization.     Recommendations  Diet recommendations: Dysphagia 2 (fine chop);Nectar-thick liquid Liquids provided via: Cup;Straw Medication Administration: Whole meds with puree (crush if needed, or via alternate means) Supervision: Trained caregiver to feed patient Compensations: Slow rate;Small sips/bites;Follow solids with liquid Postural Changes and/or Swallow Maneuvers: Seated upright 90 degrees                  Oral care BID;Oral care prior to ice chip/H20   Frequent or constant Supervision/Assistance       Continue with current plan of care     Kerrie Pleasure, MA, CCC-SLP Acute Rehabilitation Services Office: [phone] 02/17/2023, 11:20 AM

## 2023-02-17 NOTE — Progress Notes (Signed)
RT called to patient room due to patient wanting to go on bipap.  At first patient was stating that he didn't want bipap and that he just wanted the high flow.  After coaching, was able to place patient on bipap and is tolerating well at this time.  Will continue to monitor.

## 2023-02-17 NOTE — Progress Notes (Addendum)
Patient exhibiting increased work of breathing. Sats unchanged, RR 30-40 at this time. Repositioned for comfort and HFNC flow increased to 15L for his comfort. Epi off, weaning levo as tolerated. Requesting that HOB be flat. Educated on reasons for elevated HOB but states he breathes better flat. Tube feedings placed on hold at this time. Dr Merrily Pew notified of changes. No new orders received.

## 2023-02-18 ENCOUNTER — Inpatient Hospital Stay (HOSPITAL_COMMUNITY): Payer: BC Managed Care – PPO

## 2023-02-18 DIAGNOSIS — J9601 Acute respiratory failure with hypoxia: Secondary | ICD-10-CM | POA: Diagnosis not present

## 2023-02-18 DIAGNOSIS — D72829 Elevated white blood cell count, unspecified: Secondary | ICD-10-CM | POA: Diagnosis not present

## 2023-02-18 DIAGNOSIS — N186 End stage renal disease: Secondary | ICD-10-CM | POA: Diagnosis not present

## 2023-02-18 DIAGNOSIS — Z992 Dependence on renal dialysis: Secondary | ICD-10-CM | POA: Diagnosis not present

## 2023-02-18 DIAGNOSIS — R509 Fever, unspecified: Secondary | ICD-10-CM | POA: Diagnosis not present

## 2023-02-18 DIAGNOSIS — I4891 Unspecified atrial fibrillation: Secondary | ICD-10-CM | POA: Diagnosis not present

## 2023-02-18 DIAGNOSIS — R570 Cardiogenic shock: Secondary | ICD-10-CM | POA: Diagnosis not present

## 2023-02-18 LAB — POCT I-STAT, CHEM 8
BUN: 36 mg/dL — ABNORMAL HIGH (ref 6–20)
BUN: 45 mg/dL — ABNORMAL HIGH (ref 6–20)
BUN: 37 mg/dL — ABNORMAL HIGH (ref 6–20)
BUN: 31 mg/dL — ABNORMAL HIGH (ref 6–20)
BUN: 34 mg/dL — ABNORMAL HIGH (ref 6–20)
BUN: 46 mg/dL — ABNORMAL HIGH (ref 6–20)
Calcium, Ion: 0.36 mmol/L — CL (ref 1.15–1.40)
Calcium, Ion: 1.06 mmol/L — ABNORMAL LOW (ref 1.15–1.40)
Calcium, Ion: 1.07 mmol/L — ABNORMAL LOW (ref 1.15–1.40)
Calcium, Ion: 0.38 mmol/L — CL (ref 1.15–1.40)
Calcium, Ion: 0.4 mmol/L — CL (ref 1.15–1.40)
Calcium, Ion: 1.12 mmol/L — ABNORMAL LOW (ref 1.15–1.40)
Chloride: 97 mmol/L — ABNORMAL LOW (ref 98–111)
Chloride: 99 mmol/L (ref 98–111)
Chloride: 100 mmol/L (ref 98–111)
Chloride: 96 mmol/L — ABNORMAL LOW (ref 98–111)
Chloride: 100 mmol/L (ref 98–111)
Chloride: 96 mmol/L — ABNORMAL LOW (ref 98–111)
Creatinine, Ser: 1.6 mg/dL — ABNORMAL HIGH (ref 0.61–1.24)
Creatinine, Ser: 2.3 mg/dL — ABNORMAL HIGH (ref 0.61–1.24)
Creatinine, Ser: 2.3 mg/dL — ABNORMAL HIGH (ref 0.61–1.24)
Creatinine, Ser: 1.6 mg/dL — ABNORMAL HIGH (ref 0.61–1.24)
Creatinine, Ser: 1.7 mg/dL — ABNORMAL HIGH (ref 0.61–1.24)
Creatinine, Ser: 2.2 mg/dL — ABNORMAL HIGH (ref 0.61–1.24)
Glucose, Bld: 130 mg/dL — ABNORMAL HIGH (ref 70–99)
Glucose, Bld: 171 mg/dL — ABNORMAL HIGH (ref 70–99)
Glucose, Bld: 127 mg/dL — ABNORMAL HIGH (ref 70–99)
Glucose, Bld: 170 mg/dL — ABNORMAL HIGH (ref 70–99)
Glucose, Bld: 126 mg/dL — ABNORMAL HIGH (ref 70–99)
Glucose, Bld: 168 mg/dL — ABNORMAL HIGH (ref 70–99)
HCT: 25 % — ABNORMAL LOW (ref 39.0–52.0)
HCT: 29 % — ABNORMAL LOW (ref 39.0–52.0)
HCT: 27 % — ABNORMAL LOW (ref 39.0–52.0)
HCT: 29 % — ABNORMAL LOW (ref 39.0–52.0)
HCT: 27 % — ABNORMAL LOW (ref 39.0–52.0)
HCT: 29 % — ABNORMAL LOW (ref 39.0–52.0)
Hemoglobin: 8.5 g/dL — ABNORMAL LOW (ref 13.0–17.0)
Hemoglobin: 9.9 g/dL — ABNORMAL LOW (ref 13.0–17.0)
Hemoglobin: 9.2 g/dL — ABNORMAL LOW (ref 13.0–17.0)
Hemoglobin: 9.9 g/dL — ABNORMAL LOW (ref 13.0–17.0)
Hemoglobin: 9.2 g/dL — ABNORMAL LOW (ref 13.0–17.0)
Hemoglobin: 9.9 g/dL — ABNORMAL LOW (ref 13.0–17.0)
Potassium: 3.9 mmol/L (ref 3.5–5.1)
Potassium: 4.1 mmol/L (ref 3.5–5.1)
Potassium: 4.4 mmol/L (ref 3.5–5.1)
Potassium: 4.2 mmol/L (ref 3.5–5.1)
Potassium: 3.9 mmol/L (ref 3.5–5.1)
Potassium: 4.3 mmol/L (ref 3.5–5.1)
Sodium: 134 mmol/L — ABNORMAL LOW (ref 135–145)
Sodium: 136 mmol/L (ref 135–145)
Sodium: 134 mmol/L — ABNORMAL LOW (ref 135–145)
Sodium: 136 mmol/L (ref 135–145)
Sodium: 133 mmol/L — ABNORMAL LOW (ref 135–145)
Sodium: 137 mmol/L (ref 135–145)
TCO2: 23 mmol/L (ref 22–32)
TCO2: 23 mmol/L (ref 22–32)
TCO2: 23 mmol/L (ref 22–32)
TCO2: 24 mmol/L (ref 22–32)
TCO2: 23 mmol/L (ref 22–32)
TCO2: 25 mmol/L (ref 22–32)

## 2023-02-18 LAB — GLUCOSE, CAPILLARY
Glucose-Capillary: 117 mg/dL — ABNORMAL HIGH (ref 70–99)
Glucose-Capillary: 124 mg/dL — ABNORMAL HIGH (ref 70–99)
Glucose-Capillary: 150 mg/dL — ABNORMAL HIGH (ref 70–99)
Glucose-Capillary: 79 mg/dL (ref 70–99)
Glucose-Capillary: 87 mg/dL (ref 70–99)

## 2023-02-18 NOTE — Progress Notes (Signed)
[facility] NEPHROLOGY PROGRESS NOTE  Subjective:  Seen and examined in ICU. Levo gtt down to 20 micrograms/min and wt's up to 177kg, I/O yest +3.9 L.    Objective Vital signs in last 24 hours: Vitals:   02/14/23 1600 02/14/23 1615 02/14/23 1630 02/14/23 1645  BP:      Pulse:      Resp: (!) 43 (!) 38 (!) 35 (!) 31  Temp: 98.1 F (36.7 C)     TempSrc: Axillary     SpO2: 100%     Weight:      Height:         Physical Exam: Critically ill looking male, [place] O2 Heart:RRR, s1s2 nl Lungs:coarse breath sound b/l Abdomen: soft, +bs Extremities: 1+ bilat hip edema  Dialysis Access: RIJ TDC, site looks clean.   OP HD: [facility] TTS  4h   180kg  425/1.5  3K/2.5Ca bath  TDC     Assessment/ Plan:  # Hypotension - pressors down this am at levo 20, vaso 0.04. On midodrine 30 tid.   # Septic shock/ pneumococcal bacteremia: on admission strep pneumoniae grew on initial BCx's (6/28) w/ presumed PNA. Was on rocephin initially then changed to meropenem and linezolid which were completed on 7/18. New fevers this past week, antifungal was started, and now stopped. IV daptomycin started yesterday and fevers better. ID following.   # Acute hypoxic and hypercapnic respiratory failure: patient is now extubated. O2 requirement worsening up to 15 L HFNC.   # ESRD: Required emergent HD on 6/29 because of fluid overload.  Then developed shock and afib/ RVR requiring high doses of pressors.  Started CRRT on 6/29. Cont CRRT.   # Volume - admit wts were 182- 187kg (dry wt 180kg). After 1st two weeks of CRRT wts settled in around 171- 175kg. Around 7/17 we started pulling fluid again and wts' dropped to 163kg at nadir. Then pt was severe hypotensive episode and is now back up to 177kg today. CVP's up at 20- 24 today. Will resume UF w/ 50- 100 cc/hr goal now that BP's have stabilized a bit.   # CRRT anticoagulation - pt required change to citrate protocol for frequent clotting started 7/06.  We  continue using citrate protocol due to excessive clotting and intolerance to heparin.   # Hypercalcemia: Noted patient has high corrected calcium level of around 11 but stable.  Ionized systemic Ca++ is stable.  He is on citrate protocol with calcium gluconate, and we are using 0 Ca fluids for post HD IVF's and the dialysate.   # Acute on chronic systolic CHF, nonischemic: EF 47-82%.  # Anemia of CKD: Hemoglobin at goal.  Received Aranesp on 7/1 and 7/08. Hb has now been high > 12 for last 2 weeks. Follow.   #A-fib with RVR with hypotension: S/P cardioversion 7/5 -> NSR -> afib RVR 7/6 early AM refractory to [facility] x3 + amiodarone. Persistent afib w/ HR 140-180.    Vinson Moselle  MD  [facility] 02/18/2023, 10:14 AM  Recent Labs  Lab 02/17/23 1717 02/17/23 1719 02/18/23 0047 02/18/23 0058 02/18/23 0105 02/18/23 0826  HGB  --    < >  --    < > 9.9* 9.2*  ALBUMIN 1.9*  --  1.9*  --   --   --   CALCIUM 9.5  --  9.5  --   --   --   PHOS 2.9  --  3.2  --   --   --  CREATININE 2.20*  --  2.16*   < > 1.60* 2.30*  K 4.2   < > 4.1   < > 3.9 4.4   < > = values in this interval not displayed.    Inpatient medications:  apixaban  5 mg Per Tube BID   Chlorhexidine Gluconate Cloth  6 each Topical Q0600   famotidine  20 mg Per Tube Daily   feeding supplement (PROSource TF20)  60 mL Per Tube Q4H   fentaNYL (SUBLIMAZE) injection  50 mcg Intravenous Once   fiber supplement (BANATROL TF)  60 mL Per Tube BID   Gerhardt's butt cream   Topical Daily   hydrOXYzine  10 mg Per Tube TID   insulin aspart  0-9 Units Subcutaneous Q4H   insulin aspart  2 Units Subcutaneous Q4H   insulin glargine-yfgn  12 Units Subcutaneous Daily   lidocaine  1 patch Transdermal Q24H   melatonin  5 mg Per Tube QHS   midodrine  30 mg Oral TID WC   multivitamin  1 tablet Per Tube QHS   nutrition supplement (JUVEN)  1 packet Per Tube BID BM   mouth rinse  15 mL Mouth Rinse 4 times per day   QUEtiapine  50 mg Per Tube BID    sodium chloride flush  10-40 mL Intravenous Q12H    sodium chloride Stopped (01/22/23 1859)   anticoagulant sodium citrate     calcium gluconate 20 g in dextrose 5 % 1,000 mL infusion 20 g (02/18/23 0835)   citrate dextrose 280 mL/hr at 02/18/23 0700   DAPTOmycin (CUBICIN) 900 mg in sodium chloride 0.9 % IVPB Stopped (02/17/23 1345)   dexmedetomidine (PRECEDEX) IV infusion 1 mcg/kg/hr (02/18/23 0900)   feeding supplement (VITAL 1.5 CAL) 50 mL/hr at 02/18/23 0900   norepinephrine (LEVOPHED) Adult infusion 20 mcg/min (02/18/23 0900)   prismasol B22GK 4/0 1,500 mL/hr at 02/18/23 0958   prismasol B22GK 4/0 600 mL/hr at 02/18/23 1610   vasopressin 0.04 Units/min (02/18/23 0900)   sodium chloride, acetaminophen **OR** acetaminophen, anticoagulant sodium citrate, bisacodyl, docusate, fentaNYL (SUBLIMAZE) injection, ondansetron (ZOFRAN) IV, mouth rinse, polyethylene glycol, polyvinyl alcohol, senna-docusate, sodium chloride flush, surgical lubricant

## 2023-02-18 NOTE — Progress Notes (Signed)
[facility]   Reason for visit: follow up on fever  Interval History: remains on pressors, though improved; WBC 13.2; afebrile > 48 hours. Day 3 daptomycin Some bipap use but does not tolerate long.   Physical Exam: Constitutional:  Vitals:   02/18/23 1245 02/18/23 1300  BP: 125/78 110/76  Pulse: (!) 155 (!) 151  Resp: (!) 30 (!) 31  Temp:    SpO2: 97% 98%  He is alert, breathing more comfortably than previously; on nasal cannula GI: obese abdomen  Review of Systems: Constitutional: negative for fevers  Lab Results  Component Value Date   WBC 13.2 (H) 02/17/2023   HGB 9.9 (L) 02/18/2023   HCT 29.0 (L) 02/18/2023   MCV 104.9 (H) 02/17/2023   PLT 230 02/17/2023    Lab Results  Component Value Date   CREATININE 1.60 (H) 02/18/2023   BUN 31 (H) 02/18/2023   NA 136 02/18/2023   K 4.2 02/18/2023   CL 96 (L) 02/18/2023   CO2 21 (L) 02/18/2023    Lab Results  Component Value Date   ALT 11 01/21/2023   AST 18 01/21/2023   ALKPHOS 97 01/21/2023     Microbiology: Recent Results (from the past 240 hour(s))  Culture, Respiratory w Gram Stain     Status: None   Collection Time: 02/10/23  8:50 AM   Specimen: Tracheal Aspirate; Respiratory  Result Value Ref Range Status   Specimen Description TRACHEAL ASPIRATE  Final   Special Requests NONE  Final   Gram Stain   Final    FEW WBC PRESENT, PREDOMINANTLY PMN MODERATE YEAST Performed at [facility], [address] [place], [place] [address]    Culture   Final    MODERATE CANDIDA DUBLINIENSIS MODERATE CANDIDA ALBICANS    Report Status 02/12/2023 FINAL  Final  Culture, blood (Routine X 2) w Reflex to ID Panel     Status: None (Preliminary result)   Collection Time: 02/15/23  2:52 PM   Specimen: BLOOD LEFT HAND  Result Value Ref Range Status   Specimen Description BLOOD LEFT HAND  Final   Special Requests   Final    BOTTLES DRAWN AEROBIC ONLY Blood Culture results may not be  optimal due to an inadequate volume of blood received in culture bottles   Culture   Final    NO GROWTH 3 DAYS Performed at [facility], [address], [place], [place] [address]    Report Status PENDING  Incomplete  Culture, blood (Routine X 2) w Reflex to ID Panel     Status: None (Preliminary result)   Collection Time: 02/15/23  2:59 PM   Specimen: BLOOD RIGHT HAND  Result Value Ref Range Status   Specimen Description BLOOD RIGHT HAND  Final   Special Requests   Final    BOTTLES DRAWN AEROBIC ONLY Blood Culture results may not be optimal due to an inadequate volume of blood received in culture bottles   Culture   Final    NO GROWTH 3 DAYS Performed at [facility], [address] [place], [place] [address]    Report Status PENDING  Incomplete    Impression/Plan:  1.fever - has declined over the last 2 days since starting daptomycin.  No positive cultures though.   Though no site of infection identified, with improvement on daptomycin, certainly lines of consideration.  Would be ideal to have a line holiday at some point if possible, particularly  if he is able to get off pressor support.    2.  Shock - remains on pressor support and some improvement.  Likely multi factorial.    3.  ESRD - remains on CRRT.    Dr. Renold Don available over the weekend if needed, otherwise ID team will follow up on Monday

## 2023-02-18 NOTE — Plan of Care (Signed)

## 2023-02-18 NOTE — Plan of Care (Signed)
  Problem: Education: Goal: Knowledge of General Education information will improve Description: Including pain rating scale, medication(s)/side effects and non-pharmacologic comfort measures Outcome: Not Progressing   Problem: Health Behavior/Discharge Planning: Goal: Ability to manage health-related needs will improve Outcome: Not Progressing   Problem: Clinical Measurements: Goal: Ability to maintain clinical measurements within normal limits will improve Outcome: Not Progressing Goal: Will remain free from infection Outcome: Not Progressing Goal: Diagnostic test results will improve Outcome: Not Progressing Goal: Respiratory complications will improve Outcome: Not Progressing Goal: Cardiovascular complication will be avoided Outcome: Not Progressing   Problem: Activity: Goal: Risk for activity intolerance will decrease Outcome: Not Progressing   Problem: Nutrition: Goal: Adequate nutrition will be maintained Outcome: Not Progressing   Problem: Coping: Goal: Level of anxiety will decrease Outcome: Not Progressing   Problem: Elimination: Goal: Will not experience complications related to bowel motility Outcome: Not Progressing Goal: Will not experience complications related to urinary retention Outcome: Not Progressing   Problem: Pain Managment: Goal: General experience of comfort will improve Outcome: Not Progressing   Problem: Safety: Goal: Ability to remain free from injury will improve Outcome: Not Progressing   Problem: Skin Integrity: Goal: Risk for impaired skin integrity will decrease Outcome: Not Progressing   Problem: Education: Goal: Ability to describe self-care measures that may prevent or decrease complications (Diabetes Survival Skills Education) will improve Outcome: Not Progressing Goal: Individualized Educational Video(s) Outcome: Not Progressing   Problem: Coping: Goal: Ability to adjust to condition or change in health will  improve Outcome: Not Progressing   Problem: Fluid Volume: Goal: Ability to maintain a balanced intake and output will improve Outcome: Not Progressing   Problem: Health Behavior/Discharge Planning: Goal: Ability to identify and utilize available resources and services will improve Outcome: Not Progressing Goal: Ability to manage health-related needs will improve Outcome: Not Progressing   Problem: Metabolic: Goal: Ability to maintain appropriate glucose levels will improve Outcome: Not Progressing   Problem: Nutritional: Goal: Maintenance of adequate nutrition will improve Outcome: Not Progressing Goal: Progress toward achieving an optimal weight will improve Outcome: Not Progressing   Problem: Skin Integrity: Goal: Risk for impaired skin integrity will decrease Outcome: Not Progressing   Problem: Tissue Perfusion: Goal: Adequacy of tissue perfusion will improve Outcome: Not Progressing   

## 2023-02-18 NOTE — Progress Notes (Signed)
Pt placed on BIPAP for the night. Pt is resting comfortably. RT will monitor as needed.     02/18/23 2314  BiPAP/CPAP/SIPAP  $ Non-Invasive Ventilator  Non-Invasive Vent Subsequent  BiPAP/CPAP/SIPAP Pt Type Adult  BiPAP/CPAP/SIPAP SERVO  Mask Type Full face mask  Mask Size Extra large  Set Rate 8 breaths/min  Respiratory Rate 29 breaths/min  IPAP 10 cmH20  EPAP 5 cmH2O  PEEP 5 cmH20  FiO2 (%) 40 %  Minute Ventilation 20.9  Leak 72  Peak Inspiratory Pressure (PIP) 12  Tidal Volume (Vt) 793  Patient Home Equipment No  Auto Titrate No  Press High Alarm 25 cmH2O  Press Low Alarm 5 cmH2O  BiPAP/CPAP /SiPAP Vitals  BP 118/84  Bilateral Breath Sounds Diminished  MEWS Score/Color  MEWS Score 5  MEWS Score Color Red

## 2023-02-18 NOTE — Progress Notes (Signed)
NAME:  Phillip Young, MRN:  [id number], DOB:  14-Nov-1985, LOS: 28 ADMISSION DATE:  01/21/2023, CONSULTATION DATE:  01/22/23 REFERRING MD:  Danise Edge, CHIEF COMPLAINT:  SOB   History of Present Illness:  37 year old man well known to the PCCM and AHF services who presented to [facility] 6/28 with fevers, joint pains, SOB. PMHx significant for ESRD, NICM, refractory AFib, ?amio-induced lung toxicity. Patient reportedly missed HD on Thursday (has a history of leaving HD early). Reportedly ~10kg over dry weight with worsening breathing status, AFib/RVR and borderline BP.  PCCM consulted for ICU admission/further evaluation and management.  Pertinent Medical History:   Past Medical History:  Diagnosis Date   Acute on chronic respiratory failure with hypoxia ([name]) 04/21/2021   Acute on chronic systolic (congestive) heart failure ([name]) 02/26/2020   Amiodarone toxicity    Anemia    Atrial flutter ([name])    Biventricular congestive heart failure ([name])    Chronic hypoxemic respiratory failure ([name])    Class 3 severe obesity due to excess calories with serious comorbidity and body mass index (BMI) of 50.0 to 59.9 in adult [facility]) 02/26/2020   Essential hypertension 02/26/2020   GERD without esophagitis 02/26/2020   Hidradenitis suppurativa 02/26/2020   NICM (nonischemic cardiomyopathy) ([name])    Obesity hypoventilation syndrome ([name])    OSA (obstructive sleep apnea)    PAF (paroxysmal atrial fibrillation) ([name])    Pneumonia    Prediabetes 02/26/2020   Significant Hospital Events: Including procedures, antibiotic start and stop dates in addition to other pertinent events   6/28 Admit, emergent HD 6/29 Afib/RVR, shock, ICU transfer 6/30 Intubated and placed on vasopressor support and started on CRRT 7/1 Remains intubated on CRRT continue with pressor support 7/2 Agitated with SAT, remains on CRRT and low dose levophed 7/3 low dose NE, ongoing CRRT 7/4 Attempted SBT this am, apneic but severely agitated,  desaturated, HR up 130/140s.  Re-sedated but HR remains 140's, NE up from 6 to . NSR on EKG.  Was pulling 50-100on UF, since kept even for now.  CVP 8 7/5 DCCV again attempted this a.m. with success, remains on norepinephrine and vasopressin.  7/6 went into A-fib with RVR, attempted DCCV x 3, started on amiodarone infusion after amiodarone bolus 7/7 Multiple DCCV attempts without success. TF held due to vomiting. Tx denied to [facility]  7/8 KUB looked OK. No further vomiting. Tolerating TF. Large BM overnight.  7/9 vasopressors back up, remains rapid afib/ flutter, Bmx 1, wua causing hemodynamic instability 7/10 weaned sedation on mothers request, HR remains 140-220s, tmax 100.6, DNR, no escalation  Interim History / Subjective:  Patient vasopressor requirement is improving, off epinephrine, vasopressin remained at 0.04 and norepinephrine was titrated down to 20 mics Remained afebrile now Continue to require Precedex due to severe agitation Remain in A-fib with heart rate in 160s  Objective:  Blood pressure 115/74, pulse (!) 135, temperature 98.1 F (36.7 C), temperature source Axillary, resp. rate (!) 31, height 6' (1.829 m), weight (!) 176.9 kg, SpO2 100%. CVP:  [15 mmHg-57 mmHg] 27 mmHg  Vent Mode: BIPAP FiO2 (%):  [40 %] 40 % Set Rate:  [8 bmp] 8 bmp PEEP:  [5 cmH20] 5 cmH20   Intake/Output Summary (Last 24 hours) at 02/18/2023 0930 Last data filed at 02/18/2023 0900 Gross per 24 hour  Intake 3407.91 ml  Output 180 ml  Net 3227.91 ml   Filed Weights   02/16/23 0615 02/17/23 0400 02/18/23 0500  Weight: (!) 168.2 kg (!) 173.8 kg [facility])  176.9 kg   Physical Examination: General: [name], lying on the bed HEENT: [place]/AT, eyes anicteric.  moist mucus membranes Neuro: Awake, following commands, confused Chest: Tachypneic, coarse breath sounds, no wheezes or rhonchi Heart: Irregularly irregular, tachycardic, no murmurs or gallops Abdomen: Soft,  nontender, nondistended, bowel sounds present Skin: No rash   Labs and images were reviewed   Resolved Hospital Problem List:  Hematuria due to traumatic Foley insertion Pneumococcal bacteremia  Assessment & Plan:  Acute-on-chronic hypoxemic respiratory failure Bilateral multifocal pneumonia- initially strep and superimposed HCAP OHS/OSA, noncompliant with CPAP Acute on chronic HFrEF with pulmonary edema Persistent A-fib/flutter with RVR,  refractory, with previous amio induced lung and liver toxicity (05/2022) Nonischemic cardiomyopathy EF less than 20% Mixed shock septic/cardiogenic End-stage renal disease on hemodialysis Recurrent metabolic acidosis, improving Hypervolemic hyponatremia Hyperphosphatemia Hyperkalemia  Hypocalcemia  Patient's oxygen requirement is slowly going up, patient is requiring BiPAP intermittently Continue daptomycin Appreciate infectious disease follow-up Cultures remain negative Remain in refractory A-fib with RVR, cardioverted multiple times, required amiodarone without much help and now amiodarone discontinued considering prior history of liver and lung toxicity Conitnue Midodrine at 30 tid Continue to require vasopressor support with vasopressin and Levophed but requirement is better likely due to being positive fluid balance on CRRT Will start pulling some fluid on CRRT to keep net even at least Titrate Precedex with RASS goal 0/-1,  Monitor electrolytes Coox is 45% Continue aggressive electrolyte replacement  Overall poor prognosis  Best Practice (right click and "Reselect all SmartList Selections" daily)   Diet/type: Start trickle tube feeds DVT prophylaxis: DOAC GI prophylaxis: Famotidine Lines: Yes still needed Foley: n/a Code Status:  Full Last date of multidisciplinary goals of care: 02/15/23: [name] was updated at bedside in detail  The patient is critically ill due to Shock/Acute respiratory failure.  Critical care  was necessary to treat or prevent imminent or life-threatening deterioration.  Critical care was time spent personally by me on the following activities: development of treatment plan with patient and/or surrogate as well as nursing, discussions with consultants, evaluation of patient's response to treatment, examination of patient, obtaining history from patient or surrogate, ordering and performing treatments and interventions, ordering and review of laboratory studies, ordering and review of radiographic studies, pulse oximetry, re-evaluation of patient's condition and participation in multidisciplinary rounds.   During this encounter critical care time was devoted to patient care services described in this note for 32 minutes.     Cheri Fowler, MD  Pulmonary Critical Care See Amion for pager If no response to pager, please call [phone] until 7pm After 7pm, Please call E-link [phone]

## 2023-02-19 DIAGNOSIS — J9601 Acute respiratory failure with hypoxia: Secondary | ICD-10-CM | POA: Diagnosis not present

## 2023-02-19 DIAGNOSIS — I4891 Unspecified atrial fibrillation: Secondary | ICD-10-CM | POA: Diagnosis not present

## 2023-02-19 DIAGNOSIS — R570 Cardiogenic shock: Secondary | ICD-10-CM | POA: Diagnosis not present

## 2023-02-19 LAB — POCT I-STAT, CHEM 8
BUN: 33 mg/dL — ABNORMAL HIGH (ref 6–20)
BUN: 35 mg/dL — ABNORMAL HIGH (ref 6–20)
BUN: 31 mg/dL — ABNORMAL HIGH (ref 6–20)
BUN: 32 mg/dL — ABNORMAL HIGH (ref 6–20)
BUN: 37 mg/dL — ABNORMAL HIGH (ref 6–20)
BUN: 34 mg/dL — ABNORMAL HIGH (ref 6–20)
BUN: 39 mg/dL — ABNORMAL HIGH (ref 6–20)
Calcium, Ion: 0.46 mmol/L — CL (ref 1.15–1.40)
Calcium, Ion: 1.13 mmol/L — ABNORMAL LOW (ref 1.15–1.40)
Calcium, Ion: 0.36 mmol/L — CL (ref 1.15–1.40)
Calcium, Ion: 0.36 mmol/L — CL (ref 1.15–1.40)
Calcium, Ion: 1.11 mmol/L — ABNORMAL LOW (ref 1.15–1.40)
Calcium, Ion: 0.37 mmol/L — CL (ref 1.15–1.40)
Calcium, Ion: 1.13 mmol/L — ABNORMAL LOW (ref 1.15–1.40)
Chloride: 101 mmol/L (ref 98–111)
Chloride: 93 mmol/L — ABNORMAL LOW (ref 98–111)
Chloride: 95 mmol/L — ABNORMAL LOW (ref 98–111)
Chloride: 95 mmol/L — ABNORMAL LOW (ref 98–111)
Chloride: 98 mmol/L (ref 98–111)
Chloride: 95 mmol/L — ABNORMAL LOW (ref 98–111)
Chloride: 98 mmol/L (ref 98–111)
Creatinine, Ser: 1.7 mg/dL — ABNORMAL HIGH (ref 0.61–1.24)
Creatinine, Ser: 2 mg/dL — ABNORMAL HIGH (ref 0.61–1.24)
Creatinine, Ser: 1.6 mg/dL — ABNORMAL HIGH (ref 0.61–1.24)
Creatinine, Ser: 1.7 mg/dL — ABNORMAL HIGH (ref 0.61–1.24)
Creatinine, Ser: 2.2 mg/dL — ABNORMAL HIGH (ref 0.61–1.24)
Creatinine, Ser: 1.7 mg/dL — ABNORMAL HIGH (ref 0.61–1.24)
Creatinine, Ser: 2.2 mg/dL — ABNORMAL HIGH (ref 0.61–1.24)
Glucose, Bld: 123 mg/dL — ABNORMAL HIGH (ref 70–99)
Glucose, Bld: 174 mg/dL — ABNORMAL HIGH (ref 70–99)
Glucose, Bld: 192 mg/dL — ABNORMAL HIGH (ref 70–99)
Glucose, Bld: 142 mg/dL — ABNORMAL HIGH (ref 70–99)
Glucose, Bld: 185 mg/dL — ABNORMAL HIGH (ref 70–99)
Glucose, Bld: 131 mg/dL — ABNORMAL HIGH (ref 70–99)
Glucose, Bld: 178 mg/dL — ABNORMAL HIGH (ref 70–99)
HCT: 27 % — ABNORMAL LOW (ref 39.0–52.0)
HCT: 30 % — ABNORMAL LOW (ref 39.0–52.0)
HCT: 30 % — ABNORMAL LOW (ref 39.0–52.0)
HCT: 26 % — ABNORMAL LOW (ref 39.0–52.0)
HCT: 30 % — ABNORMAL LOW (ref 39.0–52.0)
HCT: 27 % — ABNORMAL LOW (ref 39.0–52.0)
HCT: 32 % — ABNORMAL LOW (ref 39.0–52.0)
Hemoglobin: 10.2 g/dL — ABNORMAL LOW (ref 13.0–17.0)
Hemoglobin: 9.2 g/dL — ABNORMAL LOW (ref 13.0–17.0)
Hemoglobin: 10.2 g/dL — ABNORMAL LOW (ref 13.0–17.0)
Hemoglobin: 10.2 g/dL — ABNORMAL LOW (ref 13.0–17.0)
Hemoglobin: 8.8 g/dL — ABNORMAL LOW (ref 13.0–17.0)
Hemoglobin: 10.9 g/dL — ABNORMAL LOW (ref 13.0–17.0)
Hemoglobin: 9.2 g/dL — ABNORMAL LOW (ref 13.0–17.0)
Potassium: 3 mmol/L — ABNORMAL LOW (ref 3.5–5.1)
Potassium: 3.4 mmol/L — ABNORMAL LOW (ref 3.5–5.1)
Potassium: 3.8 mmol/L (ref 3.5–5.1)
Potassium: 3.7 mmol/L (ref 3.5–5.1)
Potassium: 3.8 mmol/L (ref 3.5–5.1)
Potassium: 3.8 mmol/L (ref 3.5–5.1)
Potassium: 3.9 mmol/L (ref 3.5–5.1)
Sodium: 137 mmol/L (ref 135–145)
Sodium: 137 mmol/L (ref 135–145)
Sodium: 137 mmol/L (ref 135–145)
Sodium: 135 mmol/L (ref 135–145)
Sodium: 137 mmol/L (ref 135–145)
Sodium: 136 mmol/L (ref 135–145)
Sodium: 138 mmol/L (ref 135–145)
TCO2: 21 mmol/L — ABNORMAL LOW (ref 22–32)
TCO2: 26 mmol/L (ref 22–32)
TCO2: 23 mmol/L (ref 22–32)
TCO2: 23 mmol/L (ref 22–32)
TCO2: 24 mmol/L (ref 22–32)
TCO2: 24 mmol/L (ref 22–32)
TCO2: 24 mmol/L (ref 22–32)

## 2023-02-19 LAB — MAGNESIUM: Magnesium: 2.1 mg/dL (ref 1.7–2.4)

## 2023-02-19 LAB — POCT I-STAT 7, (LYTES, BLD GAS, ICA,H+H)
Acid-base deficit: 3 mmol/L — ABNORMAL HIGH (ref 0.0–2.0)
Bicarbonate: 20.4 mmol/L (ref 20.0–28.0)
Calcium, Ion: 1.11 mmol/L — ABNORMAL LOW (ref 1.15–1.40)
HCT: 25 % — ABNORMAL LOW (ref 39.0–52.0)
Hemoglobin: 8.5 g/dL — ABNORMAL LOW (ref 13.0–17.0)
O2 Saturation: 94 %
Potassium: 3.4 mmol/L — ABNORMAL LOW (ref 3.5–5.1)
Sodium: 138 mmol/L (ref 135–145)
TCO2: 21 mmol/L — ABNORMAL LOW (ref 22–32)
pCO2 arterial: 29.9 mmHg — ABNORMAL LOW (ref 32–48)
pH, Arterial: 7.44 (ref 7.35–7.45)
pO2, Arterial: 67 mmHg — ABNORMAL LOW (ref 83–108)

## 2023-02-19 LAB — GLUCOSE, CAPILLARY
Glucose-Capillary: 127 mg/dL — ABNORMAL HIGH (ref 70–99)
Glucose-Capillary: 129 mg/dL — ABNORMAL HIGH (ref 70–99)
Glucose-Capillary: 129 mg/dL — ABNORMAL HIGH (ref 70–99)
Glucose-Capillary: 131 mg/dL — ABNORMAL HIGH (ref 70–99)
Glucose-Capillary: 132 mg/dL — ABNORMAL HIGH (ref 70–99)
Glucose-Capillary: 132 mg/dL — ABNORMAL HIGH (ref 70–99)

## 2023-02-19 MED ORDER — DARBEPOETIN ALFA 200 MCG/0.4ML IJ SOSY
200.0000 ug | PREFILLED_SYRINGE | INTRAMUSCULAR | Status: DC
  Administered 2023-02-19 – 2023-03-19 (×4): 200 ug via SUBCUTANEOUS
  Filled 2023-02-19 (×5): qty 0.4

## 2023-02-19 NOTE — Progress Notes (Addendum)
[facility] NEPHROLOGY PROGRESS NOTE  Subjective:  Seen and examined in ICU. UF increased yest w/ CRRT and levo gtt up to 25-30 range now. CVP's dropped to 15-20 range. Wt up to 180kg, net I/O yest was +1.8, so far today is - 670cc.    Objective Vital signs in last 24 hours: Vitals:   02/14/23 1600 02/14/23 1615 02/14/23 1630 02/14/23 1645  BP:      Pulse:      Resp: (!) 43 (!) 38 (!) 35 (!) 31  Temp: 98.1 F (36.7 C)     TempSrc: Axillary     SpO2: 100%     Weight:      Height:         Physical Exam: Critically ill looking male, [place] O2 Heart:RRR, s1s2 nl Lungs:coarse breath sound b/l Abdomen: soft, +bs Extremities: 1+ bilat hip edema  Dialysis Access: RIJ TDC intact   OP HD: [facility] TTS  4h   180kg  425/1.5  TDC  Na citrate cath lock (no heparin) - last OP HD was 6/25, post wt 193.8kg - consistently left HD 8-12kg up at least the prior 3 wks - sensipar 60mg  three times per week - hectorol 4 mcg IV three times per week - mircera 200 mcg  IV q 2 wks, last 6/13, due 6/27    Assessment/ Plan:  # Hypotension - pressors up a bit w/ levo 25 and vaso 0.04; also getting midodrine 30 tid.   # Septic shock/ pneumococcal bacteremia: on admit, strep pneumoniae grew on initial BCx's (6/28) w/ presumed PNA. Was on rocephin initially then changed to meropenem and linezolid which were completed on 7/18. New fevers this past week, antifungal was started, and now stopped. IV daptomycin started and fevers better. ID following.   # Acute hypoxic and hypercapnic respiratory failure: patient is now extubated. O2 requirement stable today at 15 L HFNC.   # ESRD - usual HD is TTS. Required emergent HD on 6/29 because of fluid overload.  Then developed shock and afib/ RVR requiring high doses of pressors.  Started CRRT on 6/29. Cont CRRT.   # Volume - admit wts were 182- 187kg (dry wt 180kg). After 1st two weeks of CRRT wts settled in around 171- 175kg. Around 7/17 we started  pulling fluid again and wts' dropped to 163kg (CVP 4) at nadir. Then pt had a severe hypotensive episode and required many liters of volume for BP support. Wts now back up to 180kg today. CVP's were > 20 again yest and we resumed UF yesterday, goal 100 cc/hr. Continue.   # CRRT anticoagulation - pt required change to citrate protocol for frequent clotting started 7/06.  We continue using citrate protocol due to excessive clotting and intolerance to heparin.   # Hypercalcemia: Noted patient has high corrected calcium level of around 11.5 but stable.  Ionized systemic Ca++ is stable.  He is on citrate protocol with calcium gluconate, and we are using 0 Ca fluids for post HD IVF's and dialysate.   # Acute on chronic systolic CHF, nonischemic: EF 82-95%.  # Anemia of CKD: Hemoglobin at goal.  Received Aranesp 100 mcg on 7/1 and 7/08. Hb then was high due to aggressive UF and peaked at 18 on 7/06 . Now is back down around 9.5- 11 range. Most of these fluctuations are due to volume removal or loading. He should be redosed close to his mircera outpt dose of --> will resume darbe SQ weekly  while here.   #A-fib with RVR with hypotension: S/P cardioversion 7/5 -> NSR -> afib RVR 7/6 early AM refractory to [facility] x3 + amiodarone. Persistent afib w/ HR 125-170.    Vinson Moselle  MD  [facility] 02/19/2023, 9:09 AM  Recent Labs  Lab 02/18/23 1607 02/18/23 1613 02/19/23 0119 02/19/23 0347 02/19/23 0359  HGB  --    < > 8.5*  --  10.2*  ALBUMIN 2.0*  --   --  2.0*  --   CALCIUM 9.9  --   --  10.0  --   PHOS 3.5  --   --  4.0  --   CREATININE 2.14*   < >  --  2.14* 1.60*  K 4.0   < > 3.4* 3.9 3.8   < > = values in this interval not displayed.    Inpatient medications:  apixaban  5 mg Per Tube BID   Chlorhexidine Gluconate Cloth  6 each Topical Q0600   famotidine  20 mg Per Tube Daily   feeding supplement (PROSource TF20)  60 mL Per Tube Q4H   fentaNYL (SUBLIMAZE) injection  50 mcg Intravenous  Once   fiber supplement (BANATROL TF)  60 mL Per Tube BID   Gerhardt's butt cream   Topical Daily   hydrOXYzine  10 mg Per Tube TID   insulin aspart  0-9 Units Subcutaneous Q4H   insulin aspart  2 Units Subcutaneous Q4H   insulin glargine-yfgn  12 Units Subcutaneous Daily   lidocaine  1 patch Transdermal Q24H   melatonin  5 mg Per Tube QHS   midodrine  30 mg Oral TID WC   multivitamin  1 tablet Per Tube QHS   nutrition supplement (JUVEN)  1 packet Per Tube BID BM   mouth rinse  15 mL Mouth Rinse 4 times per day   QUEtiapine  50 mg Per Tube BID   sodium chloride flush  10-40 mL Intravenous Q12H    sodium chloride Stopped (01/22/23 1859)   anticoagulant sodium citrate     calcium gluconate 20 g in dextrose 5 % 1,000 mL infusion 50 mL/hr at 02/19/23 0800   citrate dextrose 3,000 mL (02/19/23 0637)   DAPTOmycin (CUBICIN) 900 mg in sodium chloride 0.9 % IVPB Stopped (02/18/23 1504)   dexmedetomidine (PRECEDEX) IV infusion 1 mcg/kg/hr (02/19/23 0800)   feeding supplement (VITAL 1.5 CAL) 50 mL/hr at 02/19/23 0800   norepinephrine (LEVOPHED) Adult infusion 26 mcg/min (02/19/23 0800)   prismasol B22GK 4/0 1,500 mL/hr at 02/19/23 0549   prismasol B22GK 4/0 600 mL/hr at 02/19/23 5638   vasopressin 0.04 Units/min (02/19/23 0800)   sodium chloride, acetaminophen **OR** acetaminophen, anticoagulant sodium citrate, bisacodyl, docusate, fentaNYL (SUBLIMAZE) injection, ondansetron (ZOFRAN) IV, mouth rinse, polyethylene glycol, polyvinyl alcohol, senna-docusate, sodium chloride flush, surgical lubricant

## 2023-02-19 NOTE — Progress Notes (Signed)
Pt wore BIPAP only for an hour, Pt placed back on 15 L HFNC [place].

## 2023-02-19 NOTE — Plan of Care (Signed)
  Problem: Education: Goal: Knowledge of General Education information will improve Description: Including pain rating scale, medication(s)/side effects and non-pharmacologic comfort measures Outcome: Not Progressing   Problem: Health Behavior/Discharge Planning: Goal: Ability to manage health-related needs will improve Outcome: Not Progressing   Problem: Clinical Measurements: Goal: Ability to maintain clinical measurements within normal limits will improve Outcome: Not Progressing Goal: Will remain free from infection Outcome: Not Progressing Goal: Diagnostic test results will improve Outcome: Not Progressing Goal: Respiratory complications will improve Outcome: Not Progressing Goal: Cardiovascular complication will be avoided Outcome: Not Progressing   Problem: Activity: Goal: Risk for activity intolerance will decrease Outcome: Not Progressing   Problem: Nutrition: Goal: Adequate nutrition will be maintained Outcome: Not Progressing   Problem: Coping: Goal: Level of anxiety will decrease Outcome: Not Progressing   Problem: Elimination: Goal: Will not experience complications related to bowel motility Outcome: Not Progressing Goal: Will not experience complications related to urinary retention Outcome: Not Progressing   Problem: Pain Managment: Goal: General experience of comfort will improve Outcome: Not Progressing   Problem: Safety: Goal: Ability to remain free from injury will improve Outcome: Not Progressing   Problem: Skin Integrity: Goal: Risk for impaired skin integrity will decrease Outcome: Not Progressing   Problem: Education: Goal: Ability to describe self-care measures that may prevent or decrease complications (Diabetes Survival Skills Education) will improve Outcome: Not Progressing Goal: Individualized Educational Video(s) Outcome: Not Progressing   Problem: Coping: Goal: Ability to adjust to condition or change in health will  improve Outcome: Not Progressing   Problem: Fluid Volume: Goal: Ability to maintain a balanced intake and output will improve Outcome: Not Progressing   Problem: Health Behavior/Discharge Planning: Goal: Ability to identify and utilize available resources and services will improve Outcome: Not Progressing Goal: Ability to manage health-related needs will improve Outcome: Not Progressing   Problem: Metabolic: Goal: Ability to maintain appropriate glucose levels will improve Outcome: Not Progressing   Problem: Nutritional: Goal: Maintenance of adequate nutrition will improve Outcome: Not Progressing Goal: Progress toward achieving an optimal weight will improve Outcome: Not Progressing   Problem: Skin Integrity: Goal: Risk for impaired skin integrity will decrease Outcome: Not Progressing   Problem: Tissue Perfusion: Goal: Adequacy of tissue perfusion will improve Outcome: Not Progressing   

## 2023-02-19 NOTE — Progress Notes (Signed)
NAME:  Phillip Young, MRN:  [id number], DOB:  May 23, 1986, LOS: 29 ADMISSION DATE:  01/21/2023, CONSULTATION DATE:  01/22/23 REFERRING MD:  Danise Edge, CHIEF COMPLAINT:  SOB   History of Present Illness:  37 year old man well known to the PCCM and AHF services who presented to [facility] 6/28 with fevers, joint pains, SOB. PMHx significant for ESRD, NICM, refractory AFib, ?amio-induced lung toxicity. Patient reportedly missed HD on Thursday (has a history of leaving HD early). Reportedly ~10kg over dry weight with worsening breathing status, AFib/RVR and borderline BP.  PCCM consulted for ICU admission/further evaluation and management.  Pertinent Medical History:   Past Medical History:  Diagnosis Date   Acute on chronic respiratory failure with hypoxia ([name]) 04/21/2021   Acute on chronic systolic (congestive) heart failure ([name]) 02/26/2020   Amiodarone toxicity    Anemia    Atrial flutter ([name])    Biventricular congestive heart failure ([name])    Chronic hypoxemic respiratory failure ([name])    Class 3 severe obesity due to excess calories with serious comorbidity and body mass index (BMI) of 50.0 to 59.9 in adult [facility]) 02/26/2020   Essential hypertension 02/26/2020   GERD without esophagitis 02/26/2020   Hidradenitis suppurativa 02/26/2020   NICM (nonischemic cardiomyopathy) ([name])    Obesity hypoventilation syndrome ([name])    OSA (obstructive sleep apnea)    PAF (paroxysmal atrial fibrillation) ([name])    Pneumonia    Prediabetes 02/26/2020   Significant Hospital Events: Including procedures, antibiotic start and stop dates in addition to other pertinent events   6/28 Admit, emergent HD 6/29 Afib/RVR, shock, ICU transfer 6/30 Intubated and placed on vasopressor support and started on CRRT 7/1 Remains intubated on CRRT continue with pressor support 7/2 Agitated with SAT, remains on CRRT and low dose levophed 7/3 low dose NE, ongoing CRRT 7/4 Attempted SBT this am, apneic but severely agitated,  desaturated, HR up 130/140s.  Re-sedated but HR remains 140's, NE up from 6 to . NSR on EKG.  Was pulling 50-100on UF, since kept even for now.  CVP 8 7/5 DCCV again attempted this a.m. with success, remains on norepinephrine and vasopressin.  7/6 went into A-fib with RVR, attempted DCCV x 3, started on amiodarone infusion after amiodarone bolus 7/7 Multiple DCCV attempts without success. TF held due to vomiting. Tx denied to [facility]  7/8 KUB looked OK. No further vomiting. Tolerating TF. Large BM overnight.  7/9 vasopressors back up, remains rapid afib/ flutter, Bmx 1, wua causing hemodynamic instability 7/10 weaned sedation on mothers request, HR remains 140-220s, tmax 100.6, DNR, no escalation  Interim History / Subjective:  Remained afebrile after daptomycin initiation Continue to require vasopressor support with Levophed and vasopressin  Levophed requirement went up as he started pulling volume on CRRT Continue with remaining refractory A-fib with RVR and heart rate 160s  Objective:  Blood pressure 111/65, pulse (!) 152, temperature 97.8 F (36.6 C), temperature source Oral, resp. rate (!) 33, height 6' (1.829 m), weight (!) 180.2 kg, SpO2 95%. CVP:  [12 mmHg-37 mmHg] 15 mmHg  FiO2 (%):  [40 %] 40 % PEEP:  [5 cmH20] 5 cmH20   Intake/Output Summary (Last 24 hours) at 02/19/2023 0842 Last data filed at 02/19/2023 0800 Gross per 24 hour  Intake 3979.11 ml  Output 3598.5 ml  Net 380.61 ml   Filed Weights   02/17/23 0400 02/18/23 0500 02/19/23 0600  Weight: (!) 173.8 kg (!) 176.9 kg (!) 180.2 kg   Physical Examination: General: Acute on chronically ill-appearing  morbidly obese male, lying on the bed HEENT: [place]/AT, eyes anicteric.  moist mucus membranes Neuro: Awake, opens eyes open to stimuli, following simple commands Chest: Coarse breath sounds, no wheezes or rhonchi Heart:, Tachycardic, tachycardic, no murmurs or gallops Abdomen: Soft, nontender, nondistended, bowel sounds  present Skin: No rash   Labs and images were reviewed   Resolved Hospital Problem List:  Hematuria due to traumatic Foley insertion Pneumococcal bacteremia  Assessment & Plan:  Acute-on-chronic hypoxemic respiratory failure Bilateral multifocal pneumonia- initially strep and superimposed HCAP OHS/OSA, noncompliant with CPAP Acute on chronic HFrEF with pulmonary edema Persistent A-fib/flutter with RVR,  refractory, with previous amio induced lung and liver toxicity (05/2022) Nonischemic cardiomyopathy EF less than 20% Mixed shock septic/cardiogenic End-stage renal disease on hemodialysis Recurrent metabolic acidosis, improving Hypervolemic hyponatremia Hyperphosphatemia Hyperkalemia  Hypocalcemia  No new change in patient's condition,  Remains on high flow nasal cannula oxygen, intermittently uses BiPAP Remain afebrile after initiation of daptomycin ID is following Cultures have been negative so far Persistently remain in A-fib with RVR heart rate ranging between 140-180 It is refractory to multiple medications and cardioversion  Conitnue Midodrine at 30 tid Continue to require vasopressor support with vasopressin and Levophed  Started pulling volume 1 CRRT, now with increasing vasopressor requirement Will start pulling some fluid on CRRT to keep net even at least Titrate Precedex with RASS goal 0/-1,  Monitor electrolytes Coox is 58% Continue aggressive electrolyte replacement  Overall poor prognosis  Best Practice (right click and "Reselect all SmartList Selections" daily)   Diet/type: Start trickle tube feeds DVT prophylaxis: DOAC GI prophylaxis: Famotidine Lines: Yes still needed Foley: n/a Code Status:  Full Last date of multidisciplinary goals of care: 02/15/23: [name] was updated at bedside in detail  The patient is critically ill due to Shock/Acute respiratory failure.  Critical care was necessary to treat or prevent imminent or life-threatening  deterioration.  Critical care was time spent personally by me on the following activities: development of treatment plan with patient and/or surrogate as well as nursing, discussions with consultants, evaluation of patient's response to treatment, examination of patient, obtaining history from patient or surrogate, ordering and performing treatments and interventions, ordering and review of laboratory studies, ordering and review of radiographic studies, pulse oximetry, re-evaluation of patient's condition and participation in multidisciplinary rounds.   During this encounter critical care time was devoted to patient care services described in this note for 31 minutes.     Cheri Fowler, MD [place] [facility]monary Critical Care See Amion for pager If no response to pager, please call [phone] until 7pm After 7pm, Please call E-link [phone]

## 2023-02-20 DIAGNOSIS — I4891 Unspecified atrial fibrillation: Secondary | ICD-10-CM | POA: Diagnosis not present

## 2023-02-20 DIAGNOSIS — J9601 Acute respiratory failure with hypoxia: Secondary | ICD-10-CM | POA: Diagnosis not present

## 2023-02-20 DIAGNOSIS — R570 Cardiogenic shock: Secondary | ICD-10-CM | POA: Diagnosis not present

## 2023-02-20 LAB — POCT I-STAT, CHEM 8
BUN: 30 mg/dL — ABNORMAL HIGH (ref 6–20)
BUN: 32 mg/dL — ABNORMAL HIGH (ref 6–20)
BUN: 32 mg/dL — ABNORMAL HIGH (ref 6–20)
BUN: 34 mg/dL — ABNORMAL HIGH (ref 6–20)
BUN: 38 mg/dL — ABNORMAL HIGH (ref 6–20)
Calcium, Ion: 0.38 mmol/L — CL (ref 1.15–1.40)
Calcium, Ion: 0.4 mmol/L — CL (ref 1.15–1.40)
Calcium, Ion: 0.4 mmol/L — CL (ref 1.15–1.40)
Calcium, Ion: 1.13 mmol/L — ABNORMAL LOW (ref 1.15–1.40)
Calcium, Ion: 1.16 mmol/L (ref 1.15–1.40)
Chloride: 100 mmol/L (ref 98–111)
Chloride: 95 mmol/L — ABNORMAL LOW (ref 98–111)
Chloride: 96 mmol/L — ABNORMAL LOW (ref 98–111)
Chloride: 96 mmol/L — ABNORMAL LOW (ref 98–111)
Chloride: 98 mmol/L (ref 98–111)
Creatinine, Ser: 1.6 mg/dL — ABNORMAL HIGH (ref 0.61–1.24)
Creatinine, Ser: 1.7 mg/dL — ABNORMAL HIGH (ref 0.61–1.24)
Creatinine, Ser: 1.7 mg/dL — ABNORMAL HIGH (ref 0.61–1.24)
Creatinine, Ser: 2.2 mg/dL — ABNORMAL HIGH (ref 0.61–1.24)
Creatinine, Ser: 2.3 mg/dL — ABNORMAL HIGH (ref 0.61–1.24)
Glucose, Bld: 133 mg/dL — ABNORMAL HIGH (ref 70–99)
Glucose, Bld: 148 mg/dL — ABNORMAL HIGH (ref 70–99)
Glucose, Bld: 167 mg/dL — ABNORMAL HIGH (ref 70–99)
Glucose, Bld: 180 mg/dL — ABNORMAL HIGH (ref 70–99)
Glucose, Bld: 191 mg/dL — ABNORMAL HIGH (ref 70–99)
HCT: 26 % — ABNORMAL LOW (ref 39.0–52.0)
HCT: 29 % — ABNORMAL LOW (ref 39.0–52.0)
HCT: 31 % — ABNORMAL LOW (ref 39.0–52.0)
HCT: 33 % — ABNORMAL LOW (ref 39.0–52.0)
HCT: 33 % — ABNORMAL LOW (ref 39.0–52.0)
Hemoglobin: 10.5 g/dL — ABNORMAL LOW (ref 13.0–17.0)
Hemoglobin: 11.2 g/dL — ABNORMAL LOW (ref 13.0–17.0)
Hemoglobin: 11.2 g/dL — ABNORMAL LOW (ref 13.0–17.0)
Hemoglobin: 8.8 g/dL — ABNORMAL LOW (ref 13.0–17.0)
Hemoglobin: 9.9 g/dL — ABNORMAL LOW (ref 13.0–17.0)
Potassium: 3.8 mmol/L (ref 3.5–5.1)
Potassium: 3.8 mmol/L (ref 3.5–5.1)
Potassium: 4.1 mmol/L (ref 3.5–5.1)
Potassium: 4.1 mmol/L (ref 3.5–5.1)
Potassium: 4.2 mmol/L (ref 3.5–5.1)
Sodium: 135 mmol/L (ref 135–145)
Sodium: 136 mmol/L (ref 135–145)
Sodium: 137 mmol/L (ref 135–145)
Sodium: 138 mmol/L (ref 135–145)
Sodium: 138 mmol/L (ref 135–145)
TCO2: 24 mmol/L (ref 22–32)
TCO2: 24 mmol/L (ref 22–32)
TCO2: 24 mmol/L (ref 22–32)
TCO2: 25 mmol/L (ref 22–32)
TCO2: 26 mmol/L (ref 22–32)

## 2023-02-20 LAB — RENAL FUNCTION PANEL
Albumin: 2.1 g/dL — ABNORMAL LOW (ref 3.5–5.0)
Albumin: 2.1 g/dL — ABNORMAL LOW (ref 3.5–5.0)
Anion gap: 16 — ABNORMAL HIGH (ref 5–15)
Anion gap: 18 — ABNORMAL HIGH (ref 5–15)
BUN: 38 mg/dL — ABNORMAL HIGH (ref 6–20)
BUN: 39 mg/dL — ABNORMAL HIGH (ref 6–20)
CO2: 24 mmol/L (ref 22–32)
CO2: 21 mmol/L — ABNORMAL LOW (ref 22–32)
Calcium: 10.1 mg/dL (ref 8.9–10.3)
Calcium: 10.3 mg/dL (ref 8.9–10.3)
Chloride: 94 mmol/L — ABNORMAL LOW (ref 98–111)
Chloride: 95 mmol/L — ABNORMAL LOW (ref 98–111)
Creatinine, Ser: 2.25 mg/dL — ABNORMAL HIGH (ref 0.61–1.24)
Creatinine, Ser: 2.25 mg/dL — ABNORMAL HIGH (ref 0.61–1.24)
GFR, Estimated: 38 mL/min — ABNORMAL LOW (ref >60)
GFR, Estimated: 38 mL/min — ABNORMAL LOW (ref >60)
Glucose, Bld: 144 mg/dL — ABNORMAL HIGH (ref 70–99)
Glucose, Bld: 140 mg/dL — ABNORMAL HIGH (ref 70–99)
Phosphorus: 4.6 mg/dL (ref 2.5–4.6)
Phosphorus: 4.1 mg/dL (ref 2.5–4.6)
Potassium: 4.2 mmol/L (ref 3.5–5.1)
Potassium: 4.3 mmol/L (ref 3.5–5.1)
Sodium: 134 mmol/L — ABNORMAL LOW (ref 135–145)
Sodium: 134 mmol/L — ABNORMAL LOW (ref 135–145)

## 2023-02-20 LAB — COOXEMETRY PANEL
Carboxyhemoglobin: 1.9 % — ABNORMAL HIGH (ref 0.5–1.5)
Methemoglobin: <0.7 % (ref 0.0–1.5)
O2 Saturation: 57.6 %
Total hemoglobin: 8.9 g/dL — ABNORMAL LOW (ref 12.0–16.0)

## 2023-02-20 LAB — GLUCOSE, CAPILLARY
Glucose-Capillary: 125 mg/dL — ABNORMAL HIGH (ref 70–99)
Glucose-Capillary: 130 mg/dL — ABNORMAL HIGH (ref 70–99)
Glucose-Capillary: 140 mg/dL — ABNORMAL HIGH (ref 70–99)
Glucose-Capillary: 145 mg/dL — ABNORMAL HIGH (ref 70–99)
Glucose-Capillary: 146 mg/dL — ABNORMAL HIGH (ref 70–99)
Glucose-Capillary: 174 mg/dL — ABNORMAL HIGH (ref 70–99)

## 2023-02-20 LAB — CBC
HCT: 28.6 % — ABNORMAL LOW (ref 39.0–52.0)
Hemoglobin: 8.8 g/dL — ABNORMAL LOW (ref 13.0–17.0)
MCH: 32.5 pg (ref 26.0–34.0)
MCHC: 30.8 g/dL (ref 30.0–36.0)
MCV: 105.5 fL — ABNORMAL HIGH (ref 80.0–100.0)
Platelets: 243 10*3/uL (ref 150–400)
RBC: 2.71 MIL/uL — ABNORMAL LOW (ref 4.22–5.81)
RDW: 18.6 % — ABNORMAL HIGH (ref 11.5–15.5)
WBC: 9.4 10*3/uL (ref 4.0–10.5)
nRBC: 0 % (ref 0.0–0.2)

## 2023-02-20 LAB — MAGNESIUM: Magnesium: 2.1 mg/dL (ref 1.7–2.4)

## 2023-02-20 NOTE — Progress Notes (Signed)
Per patient and mother's request Fecal Management System removed. Patient and mother aware of skin break down on patient's buttocks.    Kizzie Furnish, RN

## 2023-02-20 NOTE — Progress Notes (Signed)
[facility] NEPHROLOGY PROGRESS NOTE  Subjective:  Seen and examined in ICU. UF dropped to 50 cc /hr net negative due to having to raise up the levo support, which is currently at 28 mcg/ min this morning. Pt is awake.    Objective Vital signs in last 24 hours: Vitals:   02/14/23 1600 02/14/23 1615 02/14/23 1630 02/14/23 1645  BP:      Pulse:      Resp: (!) 43 (!) 38 (!) 35 (!) 31  Temp: 98.1 F (36.7 C)     TempSrc: Axillary     SpO2: 100%     Weight:      Height:         Physical Exam: Critically ill looking male, [employer] O2 Heart:RRR, s1s2 nl Lungs:coarse breath sound b/l Abdomen: soft, +bs Extremities: 1+ bilat hip edema  Dialysis Access: RIJ TDC intact   OP HD: [facility] TTS  4h   180kg  425/1.5  TDC  Na citrate cath lock (no heparin) - last OP HD was 6/25, post wt 193.8kg - consistently left HD 8-12kg up at least the prior 3 wks - sensipar 60mg  three times per week - hectorol 4 mcg IV three times per week - mircera 200 mcg  IV q 2 wks, last 6/13, due 6/27    Assessment/ Plan:  # Hypotension - pressors up a bit w/ levo 28 and vaso 0.04; also getting midodrine 30 tid.   # Septic shock/ pneumococcal bacteremia: on admit, strep pneumoniae grew on initial BCx's (6/28) w/ presumed PNA. Was on rocephin initially then changed to meropenem and linezolid which were completed on 7/18. New fevers this past week, antifungal was started, and now stopped. IV daptomycin started and fevers better. ID following.   # Acute hypoxic and hypercapnic respiratory failure: patient is now extubated. O2 requirement stable today at 15 L HFNC.   # ESRD - usual HD is TTS. Required emergent HD on 6/29 because of fluid overload.  Then developed shock and afib/ RVR requiring high doses of pressors.  Started CRRT on 6/29. Cont CRRT.   # Anemia of CKD: Hemoglobin at goal.  Received Aranesp 100 mcg on 7/1 and 7/08. Hb then was high due to aggressive UF and peaked at 18 on 7/06 . Now is  back down around 9.5- 11 range. Most of these fluctuations were due to volume removal or loading. Resumed darbepoeitin here w/ SQ weekly as he was on high dose esa at the outpt unit.   # MBD ckd - resumed his outpatient sensipar as Ca++ levels are a high here. Holding IV vdra.   # Hypercalcemia: Noted patient has high corrected calcium level of around 11.5 but stable, complication of citrate anticoagulation.  Ionized systemic Ca++ is stable.  He is on citrate protocol with calcium gluconate, and we are using 0 Ca fluids for post HD IVF's and dialysate.   # Volume - admit wts were 182- 187kg (dry wt 180kg). After 1st two weeks of CRRT wts settled in around 171- 175kg. Around 7/17 we started pulling fluid again and wts' dropped to 163kg (CVP 4) at nadir. Then pt had a severe hypotensive episode and required many liters of volume for BP support. Wts increased back up to 180kg w/ CVP > 20 and UF was resumed on 7/26. Unable to get 100 cc/hr net neg, but 50 cc/hr has been attainable per RN. Cont net neg 50 cc/hr. CVP down around 15.   # CRRT  anticoagulation - pt required change to citrate protocol for frequent clotting started 7/06.  We continue using citrate protocol due to excessive clotting and intolerance to heparin.   # Acute on chronic systolic CHF, nonischemic: EF 51-88%.  #A-fib with RVR with hypotension: S/P cardioversion 7/5 -> NSR -> afib RVR 7/6 early AM refractory to [facility] x3 + amiodarone. Persistent afib w/ HR 125-170.   Vinson Moselle  MD  [facility] 02/20/2023, 10:08 AM  Recent Labs  Lab 02/19/23 1615 02/19/23 1841 02/20/23 0446 02/20/23 0944  HGB  --    < > 8.8* 11.2*  ALBUMIN 2.0*  --  2.1*  --   CALCIUM 9.9  --  10.1  --   PHOS 4.0  --  4.6  --   CREATININE 2.22*   < > 2.25* 1.70*  K 4.0   < > 4.2 4.1   < > = values in this interval not displayed.    Inpatient medications:  apixaban  5 mg Per Tube BID   Chlorhexidine Gluconate Cloth  6 each Topical Q0600   darbepoetin  (ARANESP) injection - DIALYSIS  200 mcg Subcutaneous Q Sat-1800   famotidine  20 mg Per Tube Daily   feeding supplement (PROSource TF20)  60 mL Per Tube Q4H   fentaNYL (SUBLIMAZE) injection  50 mcg Intravenous Once   fiber supplement (BANATROL TF)  60 mL Per Tube BID   Gerhardt's butt cream   Topical Daily   hydrOXYzine  10 mg Per Tube TID   insulin aspart  0-9 Units Subcutaneous Q4H   insulin aspart  2 Units Subcutaneous Q4H   insulin glargine-yfgn  12 Units Subcutaneous Daily   lidocaine  1 patch Transdermal Q24H   melatonin  5 mg Per Tube QHS   midodrine  30 mg Oral TID WC   multivitamin  1 tablet Per Tube QHS   nutrition supplement (JUVEN)  1 packet Per Tube BID BM   mouth rinse  15 mL Mouth Rinse 4 times per day   QUEtiapine  50 mg Per Tube BID   sodium chloride flush  10-40 mL Intravenous Q12H    sodium chloride Stopped (01/22/23 1859)   anticoagulant sodium citrate     calcium gluconate 20 g in dextrose 5 % 1,000 mL infusion 50 mL/hr at 02/20/23 1000   citrate dextrose 3,000 mL (02/20/23 0115)   DAPTOmycin (CUBICIN) 900 mg in sodium chloride 0.9 % IVPB Stopped (02/19/23 1452)   dexmedetomidine (PRECEDEX) IV infusion 1 mcg/kg/hr (02/20/23 1000)   feeding supplement (VITAL 1.5 CAL) 50 mL/hr at 02/20/23 1000   norepinephrine (LEVOPHED) Adult infusion 28 mcg/min (02/20/23 1000)   prismasol B22GK 4/0 1,500 mL/hr at 02/20/23 0858   prismasol B22GK 4/0 600 mL/hr at 02/20/23 0820   vasopressin 0.04 Units/min (02/20/23 1005)   sodium chloride, acetaminophen **OR** acetaminophen, anticoagulant sodium citrate, bisacodyl, docusate, fentaNYL (SUBLIMAZE) injection, ondansetron (ZOFRAN) IV, mouth rinse, polyethylene glycol, polyvinyl alcohol, senna-docusate, sodium chloride flush, surgical lubricant

## 2023-02-20 NOTE — Plan of Care (Signed)
  Problem: Education: Goal: Knowledge of General Education information will improve Description: Including pain rating scale, medication(s)/side effects and non-pharmacologic comfort measures Outcome: Not Progressing   Problem: Health Behavior/Discharge Planning: Goal: Ability to manage health-related needs will improve Outcome: Not Progressing   Problem: Clinical Measurements: Goal: Ability to maintain clinical measurements within normal limits will improve Outcome: Not Progressing Goal: Will remain free from infection Outcome: Not Progressing Goal: Diagnostic test results will improve Outcome: Not Progressing Goal: Respiratory complications will improve Outcome: Not Progressing Goal: Cardiovascular complication will be avoided Outcome: Not Progressing   Problem: Activity: Goal: Risk for activity intolerance will decrease Outcome: Not Progressing   Problem: Nutrition: Goal: Adequate nutrition will be maintained Outcome: Not Progressing   Problem: Coping: Goal: Level of anxiety will decrease Outcome: Not Progressing   Problem: Elimination: Goal: Will not experience complications related to bowel motility Outcome: Not Progressing Goal: Will not experience complications related to urinary retention Outcome: Not Progressing   Problem: Pain Managment: Goal: General experience of comfort will improve Outcome: Not Progressing   Problem: Safety: Goal: Ability to remain free from injury will improve Outcome: Not Progressing   Problem: Skin Integrity: Goal: Risk for impaired skin integrity will decrease Outcome: Not Progressing   Problem: Education: Goal: Ability to describe self-care measures that may prevent or decrease complications (Diabetes Survival Skills Education) will improve Outcome: Not Progressing Goal: Individualized Educational Video(s) Outcome: Not Progressing   Problem: Coping: Goal: Ability to adjust to condition or change in health will  improve Outcome: Not Progressing   Problem: Fluid Volume: Goal: Ability to maintain a balanced intake and output will improve Outcome: Not Progressing   Problem: Health Behavior/Discharge Planning: Goal: Ability to identify and utilize available resources and services will improve Outcome: Not Progressing Goal: Ability to manage health-related needs will improve Outcome: Not Progressing   Problem: Metabolic: Goal: Ability to maintain appropriate glucose levels will improve Outcome: Not Progressing   Problem: Nutritional: Goal: Maintenance of adequate nutrition will improve Outcome: Not Progressing Goal: Progress toward achieving an optimal weight will improve Outcome: Not Progressing   Problem: Skin Integrity: Goal: Risk for impaired skin integrity will decrease Outcome: Not Progressing   Problem: Tissue Perfusion: Goal: Adequacy of tissue perfusion will improve Outcome: Not Progressing   

## 2023-02-20 NOTE — Progress Notes (Signed)
NAME:  Phillip Young, MRN:  [id number], DOB:  1985/12/03, LOS: 30 ADMISSION DATE:  01/21/2023, CONSULTATION DATE:  01/22/23 REFERRING MD:  Danise Edge, CHIEF COMPLAINT:  SOB   History of Present Illness:  37 year old man well known to the PCCM and AHF services who presented to [facility]/28 with fevers, joint pains, SOB. PMHx significant for ESRD, NICM, refractory AFib, ?amio-induced lung toxicity. Patient reportedly missed HD on Thursday (has a history of leaving HD early). Reportedly ~10kg over dry weight with worsening breathing status, AFib/RVR and borderline BP.  PCCM consulted for ICU admission/further evaluation and management.  Pertinent Medical History:   Past Medical History:  Diagnosis Date   Acute on chronic respiratory failure with hypoxia ([name]) 04/21/2021   Acute on chronic systolic (congestive) heart failure ([name]) 02/26/2020   Amiodarone toxicity    Anemia    Atrial flutter ([name])    Biventricular congestive heart failure ([name])    Chronic hypoxemic respiratory failure ([name])    Class 3 severe obesity due to excess calories with serious comorbidity and body mass index (BMI) of 50.0 to 59.9 in adult [facility]) 02/26/2020   Essential hypertension 02/26/2020   GERD without esophagitis 02/26/2020   Hidradenitis suppurativa 02/26/2020   NICM (nonischemic cardiomyopathy) ([name])    Obesity hypoventilation syndrome ([name])    OSA (obstructive sleep apnea)    PAF (paroxysmal atrial fibrillation) ([name])    Pneumonia    Prediabetes 02/26/2020   Significant Hospital Events: Including procedures, antibiotic start and stop dates in addition to other pertinent events   6/28 Admit, emergent HD 6/29 Afib/RVR, shock, ICU transfer 6/30 Intubated and placed on vasopressor support and started on CRRT 7/1 Remains intubated on CRRT continue with pressor support 7/2 Agitated with SAT, remains on CRRT and low dose levophed 7/3 low dose NE, ongoing CRRT 7/4 Attempted SBT this am, apneic but severely agitated,  desaturated, HR up 130/140s.  Re-sedated but HR remains 140's, NE up from 6 to . NSR on EKG.  Was pulling 50-100on UF, since kept even for now.  CVP 8 7/5 DCCV again attempted this a.m. with success, remains on norepinephrine and vasopressin.  7/6 went into A-fib with RVR, attempted DCCV x 3, started on amiodarone infusion after amiodarone bolus 7/7 Multiple DCCV attempts without success. TF held due to vomiting. Tx denied to [facility]  7/8 KUB looked OK. No further vomiting. Tolerating TF. Large BM overnight.  7/9 vasopressors back up, remains rapid afib/ flutter, Bmx 1, wua causing hemodynamic instability 7/10 weaned sedation on mothers request, HR remains 140-220s, tmax 100.6, DNR, no escalation  Interim History / Subjective:  No new change overnight Remains on vasopressor support with Levophed and vasopressin Afebrile Refractory A-fib with heart rate in 140s to 160s  Objective:  Blood pressure 114/80, pulse (!) 140, temperature 99 F (37.2 C), temperature source Axillary, resp. rate (!) 34, height 6' (1.829 m), weight (!) 180.2 kg, SpO2 100%. CVP:  [12 mmHg-22 mmHg] 13 mmHg      Intake/Output Summary (Last 24 hours) at 02/20/2023 0902 Last data filed at 02/20/2023 0900 Gross per 24 hour  Intake 4621.61 ml  Output 5853.2 ml  Net -1231.59 ml   Filed Weights   02/17/23 0400 02/18/23 0500 02/19/23 0600  Weight: (!) 173.8 kg (!) 176.9 kg (!) 180.2 kg   Physical Examination: General: Acute on chronically ill-appearing morbidly obese male, lying on the bed HEENT: Organ/AT, eyes anicteric.  moist mucus membranes Neuro: Alert, awake following commands Chest: Reduced air entry at the  bases bilaterally, no wheezes or rhonchi Heart: Tachycardic, irregularly irregular, no murmurs or gallops Abdomen: Soft, nontender, nondistended, bowel sounds present Skin: No rash   Labs and images were reviewed   Resolved Hospital Problem List:  Hematuria due to traumatic Foley insertion Pneumococcal  bacteremia  Assessment & Plan:  Acute-on-chronic hypoxemic respiratory failure Bilateral multifocal pneumonia- initially strep and superimposed HCAP OHS/OSA, noncompliant with CPAP Acute on chronic HFrEF with pulmonary edema Persistent A-fib/flutter with RVR,  refractory, with previous amio induced lung and liver toxicity (05/2022) Nonischemic cardiomyopathy EF less than 20% Mixed shock septic/cardiogenic End-stage renal disease on hemodialysis Recurrent metabolic acidosis, improving Hypervolemic hyponatremia Hyperphosphatemia Hyperkalemia  Hypocalcemia  No new change in patient's condition, persistently asking to be discharged home, explained that he is on high-dose of medication that is keeping his blood pressure Remains on high flow nasal cannula oxygen at 15 L Afebrile now after daptomycin ID is following Cultures have been negative so far Persistently remain in A-fib with RVR heart rate ranging between 140-180 It is refractory to multiple medications and cardioversion  Conitnue Midodrine at 30 tid Continue to require vasopressor support with vasopressin and Levophed.  Currently on 28 mics of Levophed and 0.04 of vasopressin Continue to follow volume on CRRT Titrate Precedex with RASS goal 0/-1,  Monitor electrolytes Coox is 58% Continue aggressive electrolyte replacement  Overall poor prognosis  Best Practice (right click and "Reselect all SmartList Selections" daily)   Diet/type: Start trickle tube feeds DVT prophylaxis: DOAC GI prophylaxis: Famotidine Lines: Yes still needed Foley: n/a Code Status:  Full Last date of multidisciplinary goals of care: 02/15/23: [name] was updated at bedside in detail  The patient is critically ill due to Shock/Acute respiratory failure.  Critical care was necessary to treat or prevent imminent or life-threatening deterioration.  Critical care was time spent personally by me on the following activities: development of  treatment plan with patient and/or surrogate as well as nursing, discussions with consultants, evaluation of patient's response to treatment, examination of patient, obtaining history from patient or surrogate, ordering and performing treatments and interventions, ordering and review of laboratory studies, ordering and review of radiographic studies, pulse oximetry, re-evaluation of patient's condition and participation in multidisciplinary rounds.   During this encounter critical care time was devoted to patient care services described in this note for 30 minutes.     Cheri Fowler, MD [facility] See Amion for pager If no response to pager, please call [phone] until 7pm After 7pm, Please call E-link [phone]

## 2023-02-21 ENCOUNTER — Other Ambulatory Visit: Payer: Self-pay

## 2023-02-21 ENCOUNTER — Inpatient Hospital Stay (HOSPITAL_COMMUNITY): Payer: BC Managed Care – PPO

## 2023-02-21 DIAGNOSIS — I4891 Unspecified atrial fibrillation: Secondary | ICD-10-CM | POA: Diagnosis not present

## 2023-02-21 DIAGNOSIS — R570 Cardiogenic shock: Secondary | ICD-10-CM | POA: Diagnosis not present

## 2023-02-21 DIAGNOSIS — J9601 Acute respiratory failure with hypoxia: Secondary | ICD-10-CM | POA: Diagnosis not present

## 2023-02-21 DIAGNOSIS — N186 End stage renal disease: Secondary | ICD-10-CM | POA: Diagnosis not present

## 2023-02-21 LAB — RENAL FUNCTION PANEL
Albumin: 2.1 g/dL — ABNORMAL LOW (ref 3.5–5.0)
Anion gap: 15 (ref 5–15)
BUN: 36 mg/dL — ABNORMAL HIGH (ref 6–20)
CO2: 24 mmol/L (ref 22–32)
Calcium: 10.5 mg/dL — ABNORMAL HIGH (ref 8.9–10.3)
Chloride: 95 mmol/L — ABNORMAL LOW (ref 98–111)
Creatinine, Ser: 2.18 mg/dL — ABNORMAL HIGH (ref 0.61–1.24)
GFR, Estimated: 39 mL/min — ABNORMAL LOW (ref >60)
Glucose, Bld: 130 mg/dL — ABNORMAL HIGH (ref 70–99)
Phosphorus: 4.6 mg/dL (ref 2.5–4.6)
Potassium: 4.3 mmol/L (ref 3.5–5.1)
Sodium: 134 mmol/L — ABNORMAL LOW (ref 135–145)

## 2023-02-21 LAB — POCT I-STAT, CHEM 8
BUN: 33 mg/dL — ABNORMAL HIGH (ref 6–20)
BUN: 35 mg/dL — ABNORMAL HIGH (ref 6–20)
BUN: 30 mg/dL — ABNORMAL HIGH (ref 6–20)
BUN: 35 mg/dL — ABNORMAL HIGH (ref 6–20)
BUN: 42 mg/dL — ABNORMAL HIGH (ref 6–20)
Calcium, Ion: 0.4 mmol/L — CL (ref 1.15–1.40)
Calcium, Ion: 1.15 mmol/L (ref 1.15–1.40)
Calcium, Ion: 0.37 mmol/L — CL (ref 1.15–1.40)
Calcium, Ion: 1.13 mmol/L — ABNORMAL LOW (ref 1.15–1.40)
Calcium, Ion: 1.29 mmol/L (ref 1.15–1.40)
Chloride: 100 mmol/L (ref 98–111)
Chloride: 96 mmol/L — ABNORMAL LOW (ref 98–111)
Chloride: 95 mmol/L — ABNORMAL LOW (ref 98–111)
Chloride: 96 mmol/L — ABNORMAL LOW (ref 98–111)
Chloride: 98 mmol/L (ref 98–111)
Creatinine, Ser: 1.7 mg/dL — ABNORMAL HIGH (ref 0.61–1.24)
Creatinine, Ser: 2 mg/dL — ABNORMAL HIGH (ref 0.61–1.24)
Creatinine, Ser: 1.7 mg/dL — ABNORMAL HIGH (ref 0.61–1.24)
Creatinine, Ser: 2.2 mg/dL — ABNORMAL HIGH (ref 0.61–1.24)
Creatinine, Ser: 2.4 mg/dL — ABNORMAL HIGH (ref 0.61–1.24)
Glucose, Bld: 144 mg/dL — ABNORMAL HIGH (ref 70–99)
Glucose, Bld: 182 mg/dL — ABNORMAL HIGH (ref 70–99)
Glucose, Bld: 118 mg/dL — ABNORMAL HIGH (ref 70–99)
Glucose, Bld: 162 mg/dL — ABNORMAL HIGH (ref 70–99)
Glucose, Bld: 119 mg/dL — ABNORMAL HIGH (ref 70–99)
HCT: 29 % — ABNORMAL LOW (ref 39.0–52.0)
HCT: 37 % — ABNORMAL LOW (ref 39.0–52.0)
HCT: 27 % — ABNORMAL LOW (ref 39.0–52.0)
HCT: 33 % — ABNORMAL LOW (ref 39.0–52.0)
HCT: 27 % — ABNORMAL LOW (ref 39.0–52.0)
Hemoglobin: 12.6 g/dL — ABNORMAL LOW (ref 13.0–17.0)
Hemoglobin: 9.9 g/dL — ABNORMAL LOW (ref 13.0–17.0)
Hemoglobin: 11.2 g/dL — ABNORMAL LOW (ref 13.0–17.0)
Hemoglobin: 9.2 g/dL — ABNORMAL LOW (ref 13.0–17.0)
Hemoglobin: 9.2 g/dL — ABNORMAL LOW (ref 13.0–17.0)
Potassium: 4.1 mmol/L (ref 3.5–5.1)
Potassium: 4.2 mmol/L (ref 3.5–5.1)
Potassium: 4.1 mmol/L (ref 3.5–5.1)
Potassium: 4.3 mmol/L (ref 3.5–5.1)
Potassium: 4.5 mmol/L (ref 3.5–5.1)
Sodium: 134 mmol/L — ABNORMAL LOW (ref 135–145)
Sodium: 136 mmol/L (ref 135–145)
Sodium: 133 mmol/L — ABNORMAL LOW (ref 135–145)
Sodium: 136 mmol/L (ref 135–145)
Sodium: 132 mmol/L — ABNORMAL LOW (ref 135–145)
TCO2: 24 mmol/L (ref 22–32)
TCO2: 25 mmol/L (ref 22–32)
TCO2: 24 mmol/L (ref 22–32)
TCO2: 25 mmol/L (ref 22–32)
TCO2: 23 mmol/L (ref 22–32)

## 2023-02-21 LAB — C-REACTIVE PROTEIN: CRP: 12.2 mg/dL — ABNORMAL HIGH (ref <1.0)

## 2023-02-21 LAB — GLUCOSE, CAPILLARY
Glucose-Capillary: 101 mg/dL — ABNORMAL HIGH (ref 70–99)
Glucose-Capillary: 103 mg/dL — ABNORMAL HIGH (ref 70–99)
Glucose-Capillary: 124 mg/dL — ABNORMAL HIGH (ref 70–99)
Glucose-Capillary: 129 mg/dL — ABNORMAL HIGH (ref 70–99)
Glucose-Capillary: 143 mg/dL — ABNORMAL HIGH (ref 70–99)

## 2023-02-21 LAB — FOLATE: Folate: 12 ng/mL (ref >5.9)

## 2023-02-21 LAB — VITAMIN D 25 HYDROXY (VIT D DEFICIENCY, FRACTURES): Vit D, 25-Hydroxy: 14.2 ng/mL — ABNORMAL LOW (ref 30–100)

## 2023-02-21 LAB — SEDIMENTATION RATE: Sed Rate: 135 mm/hr — ABNORMAL HIGH (ref 0–16)

## 2023-02-21 MED ORDER — CALCIUM GLUCONATE-NACL 2-0.675 GM/100ML-% IV SOLN
2.0000 g | Freq: Once | INTRAVENOUS | Status: DC
  Filled 2023-02-21: qty 100

## 2023-02-21 MED ORDER — MEDIHONEY WOUND/BURN DRESSING EX PSTE
1.0000 | PASTE | Freq: Every day | CUTANEOUS | Status: DC
  Administered 2023-02-21: 1 via TOPICAL
  Filled 2023-02-21: qty 44

## 2023-02-21 MED ORDER — QUETIAPINE FUMARATE 100 MG PO TABS
100.0000 mg | ORAL_TABLET | Freq: Two times a day (BID) | ORAL | Status: DC
  Administered 2023-02-21 – 2023-02-26 (×11): 100 mg
  Filled 2023-02-21 (×11): qty 1

## 2023-02-21 MED ORDER — LORAZEPAM 2 MG/ML IJ SOLN
1.0000 mg | Freq: Once | INTRAMUSCULAR | Status: AC
  Administered 2023-02-21: 1 mg via INTRAVENOUS
  Filled 2023-02-21: qty 1

## 2023-02-21 MED ORDER — SODIUM CHLORIDE 0.9 % IV SOLN
4.0000 g | Freq: Once | INTRAVENOUS | Status: DC
  Filled 2023-02-21: qty 40

## 2023-02-21 MED ORDER — LORAZEPAM 1 MG PO TABS
1.0000 mg | ORAL_TABLET | Freq: Once | ORAL | Status: AC | PRN
  Administered 2023-02-21: 1 mg via ORAL
  Filled 2023-02-21: qty 1

## 2023-02-21 MED ORDER — PHENYLEPHRINE 80 MCG/ML (10ML) SYRINGE FOR IV PUSH (FOR BLOOD PRESSURE SUPPORT)
PREFILLED_SYRINGE | INTRAVENOUS | Status: AC
  Filled 2023-02-21: qty 10

## 2023-02-21 MED ORDER — VALPROIC ACID 250 MG/5ML PO SOLN
500.0000 mg | Freq: Two times a day (BID) | ORAL | Status: DC
  Administered 2023-02-21 – 2023-02-25 (×9): 500 mg via ORAL
  Filled 2023-02-21 (×9): qty 10

## 2023-02-21 NOTE — Progress Notes (Signed)
Peripherally Inserted Central Catheter Placement  The IV Nurse has discussed with the patient and/or persons authorized to consent for the patient, the purpose of this procedure and the potential benefits and risks involved with this procedure.  The benefits include less needle sticks, lab draws from the catheter, and the patient may be discharged home with the catheter. Risks include, but not limited to, infection, bleeding, blood clot (thrombus formation), and puncture of an artery; nerve damage and irregular heartbeat and possibility to perform a PICC exchange if needed/ordered by physician.  Alternatives to this procedure were also discussed.  Bard Power PICC patient education guide, fact sheet on infection prevention and patient information card has been provided to patient /or left at bedside.    PICC Placement Documentation  PICC Triple Lumen 02/21/23 Left Cephalic 49 cm 0 cm (Active)  Indication for Insertion or Continuance of Line Vasoactive infusions;Poor Vasculature-patient has had multiple peripheral attempts or PIVs lasting less than 24 hours 02/21/23 2216  Exposed Catheter (cm) 0 cm 02/21/23 2216  Site Assessment Clean, Dry, Intact 02/21/23 2216  Lumen #1 Status Flushed;Saline locked;Blood return noted 02/21/23 2216  Lumen #2 Status Flushed;Saline locked;Blood return noted 02/21/23 2216  Lumen #3 Status Flushed;Saline locked;Blood return noted 02/21/23 2216  Dressing Type Transparent;Securing device 02/21/23 2216  Dressing Status Antimicrobial disc in place;Clean, Dry, Intact 02/21/23 2216  Safety Lock Not Applicable 02/21/23 2216  Line Care Connections checked and tightened 02/21/23 2216  Dressing Intervention New dressing 02/21/23 2216  Dressing Change Due 02/28/23 02/21/23 2216       Kazmir Oki, Lajean Manes 02/21/2023, 10:17 PM

## 2023-02-21 NOTE — Progress Notes (Signed)
At bedside for IVT consult. RN stated she prefers to hold off on another USGPIV, as the patient is currently receiving high doses of pressors at this time. Explained the PICC may not get placed for an extended time. RN agreeable to wait on PICC at this time.

## 2023-02-21 NOTE — Evaluation (Addendum)
Occupational Therapy Evaluation Patient Details Name: Phillip Young MRN: [id number] DOB: 1986-05-01 Today's Date: 02/21/2023   History of Present Illness Pt is a 37 y/o M presenting to ED on 6/28 with fever, joint pain, and SOB after missing HD. Admitted for acute hypoxic respiratory failure. Intubated and started on CRRT, vasopressor support on 6/30. Extubated 7/20. Underwent DCCV 7/5, 7/6, and 7/7 without success. 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 Pt currently needing max A +2 for rolling and pulling self  off back of bed in bed-egress position. Pt with RUE proximal vs distal limitations, unable to actively raise arm, elbow/wrist/hand globally weak. Pt needing max -total A for ADLs at bed level. Pt with decreased awareness of deficits/current abilities, wanting to stand up despite needing +2 assist for rolling. Session limited due to pain from buttock wounds and pt's L internal jugular tape coming off, RN present in room to address. Pt presenting with impairments listed below, will follow acutely. Patient will benefit from continued inpatient follow up therapy, <3 hours/day to maximize safety/ind with ADLs/functional mobility.  HR 150-180bpm BP 79/46 end of session (RN present)       Recommendations for follow up therapy are one component of a multi-disciplinary discharge planning process, led by the attending physician.  Recommendations may be updated based on patient status, additional functional criteria and insurance authorization.   Assistance Recommended at Discharge Frequent or constant Supervision/Assistance  Patient can return home with the following Two people to help with walking and/or transfers;Two people to help with bathing/dressing/bathroom;Assistance with cooking/housework;Assistance with feeding;Direct supervision/assist for  medications management;Direct supervision/assist for financial management;Help with stairs or ramp for entrance;Assist for transportation    Functional Status Assessment  Patient has had a recent decline in their functional status and demonstrates the ability to make significant improvements in function in a reasonable and predictable amount of time.  Equipment Recommendations  Other (comment) (defer)    Recommendations for Other Services PT consult     Precautions / Restrictions Precautions Precautions: Fall Precaution Comments: CRRT, NGT, multiple lines, wounds on hip/back/buttocks Restrictions Weight Bearing Restrictions: No      Mobility Bed Mobility Overal bed mobility: Needs Assistance Bed Mobility: Rolling Rolling: Max assist, +2 for physical assistance (rolling to L side)         General bed mobility comments: utilized bed-egress position, pt abel to tolerate for ~5 min before needing to be returned to supine due to L IJ IV coming out and discomfort from wounds    Transfers                   General transfer comment: deferred due to elevated HR, low BP, pt needing +2 assist to sit in egress postion      Balance Overall balance assessment: Needs assistance Sitting-balance support: Feet supported Sitting balance-Leahy Scale: Zero Sitting balance - Comments: unable to sit without support posteriorly                                   ADL either performed or assessed with clinical judgement   ADL Overall ADL's : Needs assistance/impaired Eating/Feeding: Moderate assistance   Grooming: Maximal assistance   Upper Body Bathing: Maximal assistance;Bed level;Sitting   Lower Body Bathing: Bed level;Total assistance   Upper Body Dressing :  Maximal assistance;Bed level Upper Body Dressing Details (indicate cue type and reason): donning clean gown Lower Body Dressing: Total assistance   Toilet Transfer: Maximal assistance;+2 for physical  assistance;Total assistance   Toileting- Clothing Manipulation and Hygiene: Total assistance       Functional mobility during ADLs: Maximal assistance;Total assistance;+2 for physical assistance       Vision   Additional Comments: will further assess     Perception Perception Perception Tested?: No   Praxis Praxis Praxis tested?: Not tested    Pertinent Vitals/Pain Pain Assessment Pain Assessment: Faces Pain Score: 6  Faces Pain Scale: Hurts even more Pain Location: buttocks Pain Descriptors / Indicators: Discomfort, Constant Pain Intervention(s): Limited activity within patient's tolerance, Monitored during session, Repositioned     Hand Dominance Right   Extremity/Trunk Assessment Upper Extremity Assessment Upper Extremity Assessment: RUE deficits/detail RUE Deficits / Details: unable to flex shoulder without assists, elbow/wrist/hand ROM 3/5 RUE Coordination: decreased fine motor;decreased gross motor   Lower Extremity Assessment Lower Extremity Assessment: Defer to PT evaluation   Cervical / Trunk Assessment Cervical / Trunk Assessment: Normal   Communication Communication Communication: Other (comment) (hoarse voice)   Cognition Arousal/Alertness: Awake/alert Behavior During Therapy: WFL for tasks assessed/performed Overall Cognitive Status: No family/caregiver present to determine baseline cognitive functioning                                 General Comments: overall decreased awareness of safety/current deficits, perseverative on wanting to "stand up" however needing +2 assist for rolling and sitting up from bed-egress position     General Comments  HR up to 180bpm with bed-egress position, SBP from A line reading 57 at lowest, L IJ line peeling off, RN present in room to address    Exercises     Shoulder Instructions      Home Living Family/patient expects to be discharged to:: Private residence Living Arrangements: Other  (Comment) (uncle) Available Help at Discharge: Family Type of Home: House Home Access: Stairs to enter Secretary/administrator of Steps: 5   Home Layout: One level     Bathroom Shower/Tub: Chief Strategy Officer: Handicapped height     Home Equipment: Agricultural consultant (2 wheels);Shower seat          Prior Functioning/Environment Prior Level of Function : Independent/Modified Independent             Mobility Comments: no AD use ADLs Comments: ind, was working as a bus Optician, dispensing        OT Problem List: Decreased strength;Decreased range of motion;Decreased activity tolerance;Impaired balance (sitting and/or standing);Decreased cognition;Decreased safety awareness;Cardiopulmonary status limiting activity;Impaired UE functional use;Pain      OT Treatment/Interventions: Therapeutic exercise;Self-care/ADL training;Energy conservation;DME and/or AE instruction;Therapeutic activities;Patient/family education;Balance training;Cognitive remediation/compensation;Visual/perceptual remediation/compensation    OT Goals(Current goals can be found in the care plan section) Acute Rehab OT Goals Patient Stated Goal: to stand up OT Goal Formulation: With patient Time For Goal Achievement: 03/07/23 Potential to Achieve Goals: Fair ADL Goals Pt Will Perform Grooming: with min assist;bed level Pt/caregiver will Perform Home Exercise Program: Both right and left upper extremity;With written HEP provided;With minimal assist Additional ADL Goal #1: pt will perfrom bed mobility mod A +2 in prep for ADLs/mobility and to promote pressure relief techniques  OT Frequency: Min 1X/week    Co-evaluation PT/OT/SLP Co-Evaluation/Treatment: Yes Reason for Co-Treatment: For patient/therapist safety;To address functional/ADL transfers;Complexity of the patient's impairments (  multi-system involvement);Necessary to address cognition/behavior during functional activity   OT goals addressed during  session: ADL's and self-care;Strengthening/ROM      AM-PAC OT "6 Clicks" Daily Activity     Outcome Measure Help from another person eating meals?: A Lot Help from another person taking care of personal grooming?: A Lot Help from another person toileting, which includes using toliet, bedpan, or urinal?: Total Help from another person bathing (including washing, rinsing, drying)?: A Lot Help from another person to put on and taking off regular upper body clothing?: A Lot Help from another person to put on and taking off regular lower body clothing?: Total 6 Click Score: 10   End of Session Equipment Utilized During Treatment: Oxygen Nurse Communication: Mobility status; L internal jugular tape coming off  Activity Tolerance: Treatment limited secondary to medical complications (Comment) (L IJ tape coming off) Patient left: in bed;with call bell/phone within reach;with nursing/sitter in room  OT Visit Diagnosis: Unsteadiness on feet (R26.81);Other abnormalities of gait and mobility (R26.89);Muscle weakness (generalized) (M62.81);Other symptoms and signs involving cognitive function                Time: 1125-1150 OT Time Calculation (min): 25 min Charges:  OT General Charges $OT Visit: 1 Visit OT Evaluation $OT Eval Moderate Complexity: 1 Mod  Kimm Sider K, OTD, OTR/L SecureChat Preferred Acute Rehab [phone]   [name] 02/21/2023, 12:37 PM

## 2023-02-21 NOTE — Consult Note (Addendum)
WOC Nurse wound follow up WOC consult performed recently on 7/24.  Refer to previous progress notes for locations and descriptions. Arrived to re-assess wounds as requested but bedside nurse states they have just changed and cleaned the patient and he is very difficult to turn related to a high BMI and critical condition, including ventilator and CVVHD. He has multiple systemic factors which can impair healing; including incontinence of stool leaking on the wounds, despite a Flexiseal. Bedside nurse states wounds have declined and have a large amt tan drainage with strong foul odor and wound beds are yellow-brown slough/eschar, despite Aquacel which was ordered last week to provide antimicrobial benefits and absorb drainage.  Dressing procedure/placement/frequency: Pt is on an air mattress to decrease pressure. Changed topical treatment orders for bedside nurses to perform to assist with removal of nonviable tissue: Apply Medihoney to posterior thigh/buttock wounds Q day, then cover with ABD pads and tape.  WOC will re-assess wounds again tomorrow to determine further plan of care.  Thank-you,  Cammie Mcgee MSN, RN, CWOCN, [place], CNS [phone]

## 2023-02-21 NOTE — Progress Notes (Signed)
+  eLink Physician-Brief Progress Note Patient Name: Hemingway Mcrill DOB: 1986-06-15 MRN: [id number]   Date of Service  02/21/2023  HPI/Events of Note  Critical hypocalcemia.  Critical K0.4.  Already undergoing calcium infusion with CRRT.    eICU Interventions  Additional calcium ordered.     Intervention Category Intermediate Interventions: Electrolyte abnormality - evaluation and management  Shilo Philipson 02/21/2023, 4:23 AM

## 2023-02-21 NOTE — Progress Notes (Signed)
Physical Therapy Treatment Patient Details Name: Phillip Young MRN: [id number] DOB: 1986-06-02 Today's Date: 02/21/2023   History of Present Illness Pt is a 37 y/o M presenting to ED on 6/28 with fever, joint pain, and SOB after missing HD. Admitted for acute hypoxic respiratory failure. Intubated and started on CRRT, vasopressor support on 6/30. Extubated 7/20. Underwent DCCV 7/5, 7/6, and 7/7 without success. PMH includes ESRD, NICM, refractory A fib, HFrEF    PT Comments  Pt reports ind at baseline, lives with his uncle and was a bus driver PTA. Pt currently needing max A +2 for rolling and pulling self forward off back of bed in bed-egress position. Pt with strong L lateral lean. Pt with decreased awareness of deficits/current abilities, wanting to stand up despite needing +2 assist for rolling. Session limited due to pain from buttock wounds and pt's L internal jugular tape coming off, RN present in room to address. Pt presenting with impairments listed below, will follow acutely. Patient will benefit from continued inpatient follow up therapy, <3 hours/day to maximize safety/ind with functional mobility.     If plan is discharge home, recommend the following: Two people to help with walking and/or transfers;Two people to help with bathing/dressing/bathroom;Direct supervision/assist for medications management;Direct supervision/assist for financial management;Assist for transportation;Help with stairs or ramp for entrance   Can travel by private vehicle     No  Equipment Recommendations   (TBD at next venue)    Recommendations for Other Services       Precautions / Restrictions Precautions Precautions: Fall Precaution Comments: CRRT, NGT, multiple lines, wounds on hip/back/buttocks Restrictions Weight Bearing Restrictions: No     Mobility  Bed Mobility Overal bed mobility: Needs Assistance Bed Mobility: Rolling Rolling: Max assist, +2 for physical assistance (rolling to L  side)         General bed mobility comments: utilized bed-egress position, pt able to tolerate for ~5 min before needing to be returned to supine due to L IJ IV coming out and discomfort from wounds    Transfers                   General transfer comment: deferred due to elevated HR to 180bpm, low BP, pt needing +2 assist to sit in egress postion    Ambulation/Gait                   Stairs             Wheelchair Mobility     Tilt Bed    Modified Rankin (Stroke Patients Only)       Balance Overall balance assessment: Needs assistance Sitting-balance support: Feet supported Sitting balance-Leahy Scale: Zero Sitting balance - Comments: unable to sit without support posteriorly                                    Cognition Arousal/Alertness: Awake/alert Behavior During Therapy: Restless Overall Cognitive Status: No family/caregiver present to determine baseline cognitive functioning                                 General Comments: overall decreased awareness of safety/current deficits, perseverative on wanting to "stand up" however needing +2 assist for rolling and sitting up from bed-egress position, pt A&Ox4 however with decreased insight to deficits, medical condition, and prognosis. Pt with irrational moblity requests  ie. standing up when unable to roll self in the bed        Exercises General Exercises - Lower Extremity Ankle Circles/Pumps: AAROM, Both, Seated Long Arc Quad: AROM, Both, 5 reps, Seated (unable to hold at end range on R)    General Comments General comments (skin integrity, edema, etc.): HR up to 180bpm and BP as low as 60/40s via art line. RN present and aware. L IJ IV line dressing began to loose adhesiveness as pt was diaphoretic and L IJ IV started to become dislodged, RN came to address      Pertinent Vitals/Pain Pain Assessment Pain Assessment: Faces Faces Pain Scale: Hurts whole  lot Pain Location: buttocks Pain Descriptors / Indicators: Discomfort, Constant Pain Intervention(s): Limited activity within patient's tolerance    Home Living Family/patient expects to be discharged to:: Private residence Living Arrangements: Other (Comment) (uncle) Available Help at Discharge: Family Type of Home: House Home Access: Stairs to enter   Secretary/administrator of Steps: 5   Home Layout: One level Home Equipment: Agricultural consultant (2 wheels);Shower seat      Prior Function            PT Goals (current goals can now be found in the care plan section) Acute Rehab PT Goals Patient Stated Goal: stand up and walk PT Goal Formulation: With patient Time For Goal Achievement: 03/07/23 Potential to Achieve Goals: Fair    Frequency    Min 1X/week      PT Plan      Co-evaluation PT/OT/SLP Co-Evaluation/Treatment: Yes Reason for Co-Treatment: For patient/therapist safety;To address functional/ADL transfers;Complexity of the patient's impairments (multi-system involvement);Necessary to address cognition/behavior during functional activity PT goals addressed during session: Mobility/safety with mobility OT goals addressed during session: ADL's and self-care;Strengthening/ROM      AM-PAC PT "6 Clicks" Mobility   Outcome Measure  Help needed turning from your back to your side while in a flat bed without using bedrails?: Total Help needed moving from lying on your back to sitting on the side of a flat bed without using bedrails?: Total Help needed moving to and from a bed to a chair (including a wheelchair)?: Total Help needed standing up from a chair using your arms (e.g., wheelchair or bedside chair)?: Total Help needed to walk in hospital room?: Total Help needed climbing 3-5 steps with a railing? : Total 6 Click Score: 6    End of Session Equipment Utilized During Treatment: Oxygen Activity Tolerance: Treatment limited secondary to medical complications  (Comment) Patient left: in bed;with call bell/phone within reach;with nursing/sitter in room Nurse Communication: Mobility status PT Visit Diagnosis: Unsteadiness on feet (R26.81)     Time: 4401-0272 PT Time Calculation (min) (ACUTE ONLY): 33 min  Charges:      PT General Charges $$ ACUTE PT VISIT: 1 Visit                     Lewis Shock, PT, DPT Acute Rehabilitation Services Secure chat preferred Office #: [phone]    Iona Hansen 02/21/2023, 1:11 PM

## 2023-02-21 NOTE — Progress Notes (Signed)
Initial Nutrition Assessment  DOCUMENTATION CODES:   Morbid obesity  INTERVENTION:   Tube Feeding via Cortrak:  Vital 1.5 at 50 ml/hr Pro-Source TF20 60 mL every 4 hours TF regimen at goal provides 2280 kcals, 201 g of protein and 1003 mL of free water  Continue Renal MVI daily. Pt is at risk for multiple vitamin deficiencies, especially given CRRT x 1 month, recommend checking Vitamin D, Vitamin C, Copper, Zinc and Folate with CRP level   Juven BID, each packet provides 80 calories, 8 grams of carbohydrate, 2.5  grams of protein (collagen), 7 grams of L-arginine and 7 grams of L-glutamine; supplement contains CaHMB, Vitamins C, E, B12 and Zinc to promote wound healing   Add Banatrol TF BID via tube- each packet provides  5g soluble fiber   PO as tolerated, currently not eating much of anything  NUTRITION DIAGNOSIS:   Inadequate oral intake related to inability to eat as evidenced by NPO status.  Being addressed via TF   GOAL:   Patient will meet greater than or equal to 90% of their needs  Met via TF  MONITOR:   PO intake, Diet advancement, TF tolerance, Weight trends, Labs, Skin  REASON FOR ASSESSMENT:   Ventilator, Consult Assessment of nutrition requirement/status  ASSESSMENT:   37 year old male with PMHx of ESRD on HD, nonischemic cardiomyopathy, refractory A-fib, possible amio lung toxicity admitted with acute on chronic hypoxmic respiratory failure in setting of virus and missed HD, probable sepsis.  7/25 Intermittent BiPap, TF on hold when on Bipap   Pt remains Full Code Remains on CRRT-tolerating net UF 50 ml/hr, on hold this AM due to lost access during attempt to ambulate, plan to resume once access obtained. Remains on citrate protocol, hypercalcemia related to this Remains on levophed (20) and vasopressin (0.04)  Per RN, pt has been refusing po diet, only wants to drink fluids but does not like the nectar liquids  Tolerating Vital 1.5 at 50 ml/hr via  Cortrak with Pro-Source protein modular  Noted WOC re-consulted for worsening wounds with foul smelling drainage  Pt has been on CRRT x 1 month placing pt at risk for variety of micronutrient deficiencies.   CBGs well controlled at present  Labs: potassium 4.5 (wdl), sodium 132 (L), phosphorus 4.6 (wdl), magnesium 2.0 Meds: aranesp, banatrol, ss novolog, novolog 1 4 hours, semglee daily, Reanl MVI   Diet Order:   Diet Order             DIET DYS 2 Room service appropriate? Yes; Fluid consistency: Nectar Thick  Diet effective now                   EDUCATION NEEDS:   No education needs have been identified at this time  Skin:  Skin Assessment: Skin Integrity Issues: Skin Integrity Issues:: Stage II, Other (Comment) Stage II: coccyx Other: MASD to buttocks with fissure in gluteal fold; non-blanchable dark area on L hip, L flank, back (not presenting like DTIPI, thought possibly related to poor skin/tissue perfusion and pressor use  Last BM:  7/22 FMS  Height:   Ht Readings from Last 1 Encounters:  01/21/23 6' (1.829 m)    Weight:   Wt Readings from Last 1 Encounters:  02/21/23 (!) 178.2 kg    Ideal Body Weight:  80.9 kg  BMI:  Body mass index is 53.28 kg/m.  Estimated Nutritional Needs:   Kcal:  2100-2300 kcals  Protein:  160-190 g  Fluid:  UOP +  1 L  Romelle Starcher MS, RDN, LDN, CNSC Registered Dietitian 3 Clinical Nutrition RD Pager and On-Call Pager Number Located in [place]

## 2023-02-21 NOTE — Plan of Care (Signed)
°  Problem: Education: °Goal: Knowledge of General Education information will improve °Description: Including pain rating scale, medication(s)/side effects and non-pharmacologic comfort measures °Outcome: Not Progressing °  °Problem: Health Behavior/Discharge Planning: °Goal: Ability to manage health-related needs will improve °Outcome: Not Progressing °  °Problem: Clinical Measurements: °Goal: Ability to maintain clinical measurements within normal limits will improve °Outcome: Not Progressing °Goal: Will remain free from infection °Outcome: Not Progressing °Goal: Diagnostic test results will improve °Outcome: Not Progressing °Goal: Respiratory complications will improve °Outcome: Not Progressing °Goal: Cardiovascular complication will be avoided °Outcome: Not Progressing °  °Problem: Activity: °Goal: Risk for activity intolerance will decrease °Outcome: Not Progressing °  °Problem: Nutrition: °Goal: Adequate nutrition will be maintained °Outcome: Not Progressing °  °Problem: Coping: °Goal: Level of anxiety will decrease °Outcome: Not Progressing °  °Problem: Elimination: °Goal: Will not experience complications related to bowel motility °Outcome: Not Progressing °Goal: Will not experience complications related to urinary retention °Outcome: Not Progressing °  °

## 2023-02-21 NOTE — Progress Notes (Addendum)
eLink Physician-Brief Progress Note Patient Name: Phillip Young DOB: 05-04-1986 MRN: [id number]   Date of Service  02/21/2023  HPI/Events of Note  Patient has a history of severe anxiety and numerous family members in the room that are facilitating the anxiety.  Lost his central access earlier today he needs a PICC placement patient is requiring anxiolytics to undergo the procedure.  Ativan with good effect in the past.  eICU Interventions  Will add on Ativan 1 mg once scheduled with one-time repeat dose available if this is insufficient    2029 -again spoke to Mrs. Chao tonight as she insist that the patient be transferred to [facility] for medical evaluation and care.  I discussed with her that there is no medical indication for transfer, there is no additional care that [facility] would provide that we are unable to provide at this time.  She insist that the patient be transferred for PICC line placement although the PICC team is outside the patient's room and ready to perform the procedure.  I again insisted that they are capable of reaching out to [facility] for a transfer request and I encouraged them to seek that option if they believe so strongly that the patient needs to be transferred.  The patient's mother abruptly ended the conversation stating that the clinical course was unacceptable and she no longer wished to have a discussion about ongoing care.  I again recommended that the PICC line be placed so that ongoing care could be provided.  No clear indication for transfer.  If a transfer request was initiated, I would recommend that the primary team initiate this request during the daytime.   Intervention Category Minor Interventions: Agitation / anxiety - evaluation and management  Margarita Bobrowski 02/21/2023, 7:56 PM

## 2023-02-21 NOTE — Progress Notes (Signed)
[facility] NEPHROLOGY PROGRESS NOTE  Subjective:  Seen and examined in ICU. NE weaned some compared to yesterday. I/Os 4.8 / 6.8 yesterday.  Pt is awake.  Requesting switch to iHD - discussed hemodynamically not feasible right now in plain language.    Objective Vital signs in last 24 hours: Vitals:   02/14/23 1600 02/14/23 1615 02/14/23 1630 02/14/23 1645  BP:      Pulse:      Resp: (!) 43 (!) 38 (!) 35 (!) 31  Temp: 98.1 F (36.7 C)     TempSrc: Axillary     SpO2: 100%     Weight:      Height:         Physical Exam: chronically ill looking male, [place] O2 Heart:RRR, s1s2 nl Lungs:coarse breath sound b/l Abdomen: soft, +bs Extremities: 1+ bilat hip edema  Dialysis Access: RIJ TDC intact   OP HD: [facility] TTS  4h   180kg  425/1.5  TDC  Na citrate cath lock (no heparin) - last OP HD was 6/25, post wt 193.8kg - consistently left HD 8-12kg up at least the prior 3 wks - sensipar 60mg  three times per week - hectorol 4 mcg IV three times per week - mircera 200 mcg  IV q 2 wks, last 6/13, due 6/27    Assessment/ Plan:  # Hypotension - pressors down a bit w/ levo 20 and vaso 0.04; also getting midodrine 30 tid.   # Septic shock/ pneumococcal bacteremia: on admit, strep pneumoniae grew on initial BCx's (6/28) w/ presumed PNA. Was on rocephin initially then changed to meropenem and linezolid which were completed on 7/18. New fevers this past week, antifungal was started, and now stopped. IV daptomycin started and now afebrile. ID following.   # Acute hypoxic and hypercapnic respiratory failure: patient is now extubated. Stable on HFNC  # ESRD - usual HD is TTS. Required emergent HD on 6/29 because of fluid overload.  Then developed shock and afib/ RVR requiring high doses of pressors.  Started CRRT on 6/29. Cont CRRT.   # Anemia of CKD:  on max ESA with variable Hbs, generally in the 9s.   # MBD ckd - on outpt cinacalcet, holding VDRA in setting of hyper Ca.   #  Hypercalcemia: Noted patient has high corrected calcium level of around 11.3 but stable, complication of citrate anticoagulation.  Ionized systemic Ca++ is stable.  He is on citrate protocol with calcium gluconate, and we are using 0 Ca fluids for post HD IVF's and dialysate.   # Volume - CVP 17 this AM - tolerating net neg 50/hr at this time.  Cont CRRT for volume removal at thsi time.   # CRRT anticoagulation - pt required change to citrate protocol for frequent clotting started 7/06.  We continue using citrate protocol due to excessive clotting and intolerance to heparin.   # Acute on chronic systolic CHF, nonischemic: EF 29-52%.  #A-fib with RVR with hypotension: S/P cardioversion 7/5 -> NSR -> afib RVR 7/6 early AM refractory to [facility] x3 + amiodarone. Persistent afib w/ HR > 100  Estill Bakes MD [facility] Pager [phone]   Recent Labs  Lab 02/20/23 1602 02/21/23 0126 02/21/23 0401 02/21/23 0954 02/21/23 0956  HGB  --    < > 9.0* 11.2* 9.2*  ALBUMIN 2.1*  --  2.1*  --   --   CALCIUM 10.3  --  10.5*  --   --   PHOS 4.1  --  4.6  --   --   CREATININE 2.25*   < > 2.18* 1.70* 2.20*  K 4.3   < > 4.3 4.1 4.3   < > = values in this interval not displayed.    Inpatient medications:  apixaban  5 mg Per Tube BID   Chlorhexidine Gluconate Cloth  6 each Topical Q0600   darbepoetin (ARANESP) injection - DIALYSIS  200 mcg Subcutaneous Q Sat-1800   famotidine  20 mg Per Tube Daily   feeding supplement (PROSource TF20)  60 mL Per Tube Q4H   fiber supplement (BANATROL TF)  60 mL Per Tube BID   Gerhardt's butt cream   Topical Daily   hydrOXYzine  10 mg Per Tube TID   insulin aspart  0-9 Units Subcutaneous Q4H   insulin aspart  2 Units Subcutaneous Q4H   insulin glargine-yfgn  12 Units Subcutaneous Daily   lidocaine  1 patch Transdermal Q24H   melatonin  5 mg Per Tube QHS   midodrine  30 mg Oral TID WC   multivitamin  1 tablet Per Tube QHS   nutrition supplement  (JUVEN)  1 packet Per Tube BID BM   mouth rinse  15 mL Mouth Rinse 4 times per day   QUEtiapine  100 mg Per Tube BID   sodium chloride flush  10-40 mL Intravenous Q12H   valproic acid  500 mg Oral BID    sodium chloride Stopped (01/22/23 1859)   anticoagulant sodium citrate     calcium gluconate 20 g in dextrose 5 % 1,000 mL infusion 60 mL/hr at 02/21/23 0900   citrate dextrose 3,000 mL (02/21/23 0651)   DAPTOmycin (CUBICIN) 900 mg in sodium chloride 0.9 % IVPB Stopped (02/20/23 1519)   dexmedetomidine (PRECEDEX) IV infusion 1 mcg/kg/hr (02/21/23 0928)   feeding supplement (VITAL 1.5 CAL) 50 mL/hr at 02/21/23 0900   norepinephrine (LEVOPHED) Adult infusion 20 mcg/min (02/21/23 0900)   prismasol B22GK 4/0 1,500 mL/hr at 02/21/23 0732   prismasol B22GK 4/0 600 mL/hr at 02/21/23 0911   vasopressin 0.04 Units/min (02/21/23 0948)   sodium chloride, acetaminophen **OR** acetaminophen, anticoagulant sodium citrate, bisacodyl, docusate, fentaNYL (SUBLIMAZE) injection, ondansetron (ZOFRAN) IV, mouth rinse, polyethylene glycol, polyvinyl alcohol, senna-docusate, sodium chloride flush, surgical lubricant

## 2023-02-21 NOTE — Progress Notes (Addendum)
SLP Cancellation Note  Patient Details Name: Phillip Young MRN: [id number] DOB: Mar 10, 1986   Cancelled treatment:        Attempted to see pt for ongoing dysphagia management. Pt unavailable at time of attempt. Working with PT/OT.  RN reports good tolerance of POs but poor appetite.  SLP will reattempt as schedule permits.    2ND ATTEMPT: Pt unable to participate in PO trials 2/2 positioning, unstable BP, discomfort from wounds. Pt reports good tolerance of current textures/consistencies.  Kerrie Pleasure, MA, CCC-SLP Acute Rehabilitation Services Office: [phone] 02/21/2023, 11:43 AM

## 2023-02-21 NOTE — Progress Notes (Signed)
NAME:  Phillip Young, MRN:  [id number], DOB:  30-Dec-1985, LOS: 31 ADMISSION DATE:  01/21/2023, CONSULTATION DATE:  01/22/23 REFERRING MD:  Danise Edge, CHIEF COMPLAINT:  SOB   History of Present Illness:  37 year old man well known to the PCCM and AHF services who presented to [facility] 6/28 with fevers, joint pains, SOB. PMHx significant for ESRD, NICM, refractory AFib, ?amio-induced lung toxicity. Patient reportedly missed HD on Thursday (has a history of leaving HD early). Reportedly ~10kg over dry weight with worsening breathing status, AFib/RVR and borderline BP.  PCCM consulted for ICU admission/further evaluation and management.  Pertinent Medical History:   Past Medical History:  Diagnosis Date   Acute on chronic respiratory failure with hypoxia ([name]) 04/21/2021   Acute on chronic systolic (congestive) heart failure ([name]) 02/26/2020   Amiodarone toxicity    Anemia    Atrial flutter ([name])    Biventricular congestive heart failure ([name])    Chronic hypoxemic respiratory failure ([name])    Class 3 severe obesity due to excess calories with serious comorbidity and body mass index (BMI) of 50.0 to 59.9 in adult [facility]) 02/26/2020   Essential hypertension 02/26/2020   GERD without esophagitis 02/26/2020   Hidradenitis suppurativa 02/26/2020   NICM (nonischemic cardiomyopathy) ([name])    Obesity hypoventilation syndrome ([name])    OSA (obstructive sleep apnea)    PAF (paroxysmal atrial fibrillation) ([name])    Pneumonia    Prediabetes 02/26/2020   Significant Hospital Events: Including procedures, antibiotic start and stop dates in addition to other pertinent events   6/28 Admit, emergent HD 6/29 Afib/RVR, shock, ICU transfer 6/30 Intubated and placed on vasopressor support and started on CRRT 7/1 Remains intubated on CRRT continue with pressor support 7/2 Agitated with SAT, remains on CRRT and low dose levophed 7/3 low dose NE, ongoing CRRT 7/4 Attempted SBT this am, apneic but severely agitated,  desaturated, HR up 130/140s.  Re-sedated but HR remains 140's, NE up from 6 to . NSR on EKG.  Was pulling 50-100on UF, since kept even for now.  CVP 8 7/5 DCCV again attempted this a.m. with success, remains on norepinephrine and vasopressin.  7/6 went into A-fib with RVR, attempted DCCV x 3, started on amiodarone infusion after amiodarone bolus 7/7 Multiple DCCV attempts without success. TF held due to vomiting. Tx denied to [facility]  7/8 KUB looked OK. No further vomiting. Tolerating TF. Large BM overnight.  7/9 vasopressors back up, remains rapid afib/ flutter, Bmx 1, wua causing hemodynamic instability 7/10 weaned sedation on mothers request, HR remains 140-220s, tmax 100.6, DNR, no escalation.  Heart failure signed off, not a candidate for advanced heart failure therapies at this time 7/12 [facility] denied transfer, CODE STATUS changed from DNR back to full code 7/13 Amiodarone restarted with ESR trend  7/16 Amio stopped with no improvement see in rate control  7/20 extubated 7/22 Issues with agitation post extubation, improved with Precedex. Tmax 101, respiratory culture with moderate yeast and fluconazole started 7/23 ID consulted  7/24 ID stopped fluconazole and started Daptomycin for fever of unknown origin  7/25 Intermittent BIPAP need 7/28 Remains on vasopressor support with Levophed and vasopressin. Refractory A-fib with heart rate in 140s to 160s 7/29 CVC dislodged during attempts to ambulate, CRRT stopped and central access utilized for both levo and vaso.  PICC line order placed  Interim History / Subjective:  Seen lying in bed with significant complaints of being immobile in bed, requesting to ambulate.  Objective:  Blood pressure 114/80, pulse Marland Kitchen)  135, temperature 97.8 F (36.6 C), temperature source Oral, resp. rate (!) 26, height 6' (1.829 m), weight (!) 178.2 kg, SpO2 98%. CVP:  [12 mmHg-25 mmHg] 20 mmHg      Intake/Output Summary (Last 24 hours) at 02/21/2023 1022 Last  data filed at 02/21/2023 0900 Gross per 24 hour  Intake 4609.42 ml  Output 6589.3 ml  Net -1979.88 ml   Filed Weights   02/18/23 0500 02/19/23 0600 02/21/23 0600  Weight: (!) 176.9 kg (!) 180.2 kg (!) 178.2 kg   Physical Examination: General: Acute on chronic ill-appearing middle-aged male lying in bed in no acute distress HEENT: [place]/AT, MM pink/moist, PERRL,  Neuro: Alert and interactive this a.m., weak CV: s1s2 regular rate and rhythm, no murmur, rubs, or gallops,  PULM: Diminished breath sounds bilaterally, no added breath sounds, on 15 L nasal cannula GI: soft, bowel sounds active in all 4 quadrants, non-tender, non-distended Extremities: warm/dry, no edema  Skin: no rashes or lesions  Resolved Hospital Problem List:  Hematuria due to traumatic Foley insertion Pneumococcal bacteremia  Assessment & Plan:  Acute-on-chronic hypoxemic respiratory failure Bilateral multifocal pneumonia -Initially strep and superimposed HCAP OHS/OSA -Noncompliant with CPAP P: Continue supplemental oxygen with sat goal greater than 90% Head of bed elevated 30 degrees Follow intermittent chest x-ray and ABG.   Ensure adequate pulmonary hygiene  Mobilize as able  Fever of unknown origin  -Daptomycin started per ID 7/24  P: ID following, appreciate assistance  Continue ABT per ID  Trend CBC and fever curve   Acute on chronic HFrEF with pulmonary edema Persistent A-fib/flutter with RVR -Refractory, with previous amio induced lung and liver toxicity (05/2022). Amio again trailed from 7/13-7/16 with little improvement seen in rate  Nonischemic cardiomyopathy EF less than 20% Mixed shock septic/cardiogenic P: Heart failure signed off 7/10, given tolerance of extubation may be worth re-engaging team vs trying to again transfer to tertiary facility COOX 59.7 Remains on high dose Levo and vaso (CVC access lost blood pressure dropped drastically when pressors stopped) Continue high dose Midodrine   Continuous telemetry   End-stage renal disease on hemodialysis Recurrent metabolic acidosis, improving -CVC dislodged 7/29 resulting in need to stop CRRT in order to emergently utilize access for vasopressor need  Metabolic bone disease  Hypercalcemia  P: Nephrology following, appreciate assistance Remains on CRRT since 6/29, on hold am 7/29 due to lost access  Follow renal function  Monitor urine output Trend Bmet Avoid nephrotoxins Ensure adequate renal perfusion  On citrate protocol   Hypervolemic hyponatremia Hyperphosphatemia Hyperkalemia  Hypocalcemia P: Supplement as needed  Trend Bmet   Anemia  P: Trend CBC  Transfuse per protocol  Hgb goal > 7  Overall patient remains critically ill with multi system organ involvement but has shown some improvement with being able to liberate from ventilator. Might be worth re-engaging with [facility] to see if patient my be able to transfer for ongoing cardiac care. Will discuss with attending.   Best Practice (right click and "Reselect all SmartList Selections" daily)   Diet/type: Start trickle tube feeds DVT prophylaxis: DOAC GI prophylaxis: Famotidine Lines: Yes still needed Foley: n/a Code Status:  Full Last date of multidisciplinary goals of care: No family at bedside am 7/29 will call for u  CRITICAL CARE Performed by: Gayleen Sholtz D. Harris  Total critical care time: 42 minutes  Critical care time was exclusive of separately billable procedures and treating other patients.  Critical care was necessary to treat or prevent imminent or life-threatening  deterioration.  Critical care was time spent personally by me on the following activities: development of treatment plan with patient and/or surrogate as well as nursing, discussions with consultants, evaluation of patient's response to treatment, examination of patient, obtaining history from patient or surrogate, ordering and performing treatments and interventions, ordering  and review of laboratory studies, ordering and review of radiographic studies, pulse oximetry and re-evaluation of patient's condition.  Sunjai Levandoski D. Harris, NP-C [facility] Personal contact information can be found on Amion  If no contact or response made please call [phone] 02/21/2023, 11:12 AM

## 2023-02-21 NOTE — Progress Notes (Signed)
PICC nurse has made bedside RN Dorma Russell aware that PICC will not be able to be placed until after 1900 tonight.

## 2023-02-22 DIAGNOSIS — I4891 Unspecified atrial fibrillation: Secondary | ICD-10-CM | POA: Diagnosis not present

## 2023-02-22 DIAGNOSIS — N186 End stage renal disease: Secondary | ICD-10-CM | POA: Diagnosis not present

## 2023-02-22 DIAGNOSIS — J9601 Acute respiratory failure with hypoxia: Secondary | ICD-10-CM | POA: Diagnosis not present

## 2023-02-22 DIAGNOSIS — R570 Cardiogenic shock: Secondary | ICD-10-CM | POA: Diagnosis not present

## 2023-02-22 LAB — POCT I-STAT, CHEM 8
BUN: 53 mg/dL — ABNORMAL HIGH (ref 6–20)
BUN: 60 mg/dL — ABNORMAL HIGH (ref 6–20)
BUN: 44 mg/dL — ABNORMAL HIGH (ref 6–20)
BUN: 51 mg/dL — ABNORMAL HIGH (ref 6–20)
BUN: 41 mg/dL — ABNORMAL HIGH (ref 6–20)
BUN: 43 mg/dL — ABNORMAL HIGH (ref 6–20)
Calcium, Ion: 0.37 mmol/L — CL (ref 1.15–1.40)
Calcium, Ion: 1.02 mmol/L — ABNORMAL LOW (ref 1.15–1.40)
Calcium, Ion: 0.34 mmol/L — CL (ref 1.15–1.40)
Calcium, Ion: 0.99 mmol/L — ABNORMAL LOW (ref 1.15–1.40)
Calcium, Ion: 0.34 mmol/L — CL (ref 1.15–1.40)
Calcium, Ion: 1 mmol/L — ABNORMAL LOW (ref 1.15–1.40)
Chloride: 95 mmol/L — ABNORMAL LOW (ref 98–111)
Chloride: 97 mmol/L — ABNORMAL LOW (ref 98–111)
Chloride: 93 mmol/L — ABNORMAL LOW (ref 98–111)
Chloride: 96 mmol/L — ABNORMAL LOW (ref 98–111)
Chloride: 95 mmol/L — ABNORMAL LOW (ref 98–111)
Chloride: 98 mmol/L (ref 98–111)
Creatinine, Ser: 2.3 mg/dL — ABNORMAL HIGH (ref 0.61–1.24)
Creatinine, Ser: 3 mg/dL — ABNORMAL HIGH (ref 0.61–1.24)
Creatinine, Ser: 2 mg/dL — ABNORMAL HIGH (ref 0.61–1.24)
Creatinine, Ser: 2.6 mg/dL — ABNORMAL HIGH (ref 0.61–1.24)
Creatinine, Ser: 1.9 mg/dL — ABNORMAL HIGH (ref 0.61–1.24)
Creatinine, Ser: 2.6 mg/dL — ABNORMAL HIGH (ref 0.61–1.24)
Glucose, Bld: 141 mg/dL — ABNORMAL HIGH (ref 70–99)
Glucose, Bld: 178 mg/dL — ABNORMAL HIGH (ref 70–99)
Glucose, Bld: 198 mg/dL — ABNORMAL HIGH (ref 70–99)
Glucose, Bld: 210 mg/dL — ABNORMAL HIGH (ref 70–99)
Glucose, Bld: 156 mg/dL — ABNORMAL HIGH (ref 70–99)
Glucose, Bld: 190 mg/dL — ABNORMAL HIGH (ref 70–99)
HCT: 27 % — ABNORMAL LOW (ref 39.0–52.0)
HCT: 32 % — ABNORMAL LOW (ref 39.0–52.0)
HCT: 26 % — ABNORMAL LOW (ref 39.0–52.0)
HCT: 32 % — ABNORMAL LOW (ref 39.0–52.0)
HCT: 32 % — ABNORMAL LOW (ref 39.0–52.0)
HCT: 41 % (ref 39.0–52.0)
Hemoglobin: 10.9 g/dL — ABNORMAL LOW (ref 13.0–17.0)
Hemoglobin: 9.2 g/dL — ABNORMAL LOW (ref 13.0–17.0)
Hemoglobin: 10.9 g/dL — ABNORMAL LOW (ref 13.0–17.0)
Hemoglobin: 8.8 g/dL — ABNORMAL LOW (ref 13.0–17.0)
Hemoglobin: 10.9 g/dL — ABNORMAL LOW (ref 13.0–17.0)
Hemoglobin: 13.9 g/dL (ref 13.0–17.0)
Potassium: 4.6 mmol/L (ref 3.5–5.1)
Potassium: 4.9 mmol/L (ref 3.5–5.1)
Potassium: 4.5 mmol/L (ref 3.5–5.1)
Potassium: 4.5 mmol/L (ref 3.5–5.1)
Potassium: 4.4 mmol/L (ref 3.5–5.1)
Potassium: 4.6 mmol/L (ref 3.5–5.1)
Sodium: 131 mmol/L — ABNORMAL LOW (ref 135–145)
Sodium: 134 mmol/L — ABNORMAL LOW (ref 135–145)
Sodium: 132 mmol/L — ABNORMAL LOW (ref 135–145)
Sodium: 134 mmol/L — ABNORMAL LOW (ref 135–145)
Sodium: 132 mmol/L — ABNORMAL LOW (ref 135–145)
Sodium: 134 mmol/L — ABNORMAL LOW (ref 135–145)
TCO2: 22 mmol/L (ref 22–32)
TCO2: 23 mmol/L (ref 22–32)
TCO2: 21 mmol/L — ABNORMAL LOW (ref 22–32)
TCO2: 22 mmol/L (ref 22–32)
TCO2: 22 mmol/L (ref 22–32)
TCO2: 24 mmol/L (ref 22–32)

## 2023-02-22 LAB — GLUCOSE, CAPILLARY
Glucose-Capillary: 136 mg/dL — ABNORMAL HIGH (ref 70–99)
Glucose-Capillary: 138 mg/dL — ABNORMAL HIGH (ref 70–99)
Glucose-Capillary: 141 mg/dL — ABNORMAL HIGH (ref 70–99)
Glucose-Capillary: 122 mg/dL — ABNORMAL HIGH (ref 70–99)

## 2023-02-22 NOTE — Consult Note (Addendum)
WOC Nurse Consult Note: Requested to reassess wounds to bilat buttocks and posterior thigh wounds. They have declined, according to the bedside nurses, since Pt often refuses to turn and insisted that the [facility] be removed, so now he is frequently incontinent of watery stool and it is soiling the wounds. Bilat buttocks and posterior thighs with red moist macerated full thickness skin loss and moisture associated skin damage; affected areas is approx 10X20X.3cm. Large amt tan drainage, some strong odor.  Pt is on a low airloss mattress to reduce pressure.  Topical treatment orders provided for bedside nurses to perform as follows to provide antimicrobial benefits and absorb drainage: Apply Aquacel [place] # [phone]) to bilat posterior thigh/buttock wounds Q day, then cover with foam dressings.  Change foam dressings Q 3 days or PRN soiling. Cleanse soiled areas with Vashe soaked gauze each time before applying dressings. Please re-consult if further assistance is needed.  Thank-you,  Cammie Mcgee MSN, RN, CWOCN, Alivia Cimino, CNS [phone]

## 2023-02-22 NOTE — Plan of Care (Signed)
  Problem: Education: Goal: Knowledge of General Education information will improve Description: Including pain rating scale, medication(s)/side effects and non-pharmacologic comfort measures Outcome: Not Progressing   Problem: Health Behavior/Discharge Planning: Goal: Ability to manage health-related needs will improve Outcome: Not Progressing   Problem: Activity: Goal: Risk for activity intolerance will decrease Outcome: Progressing

## 2023-02-22 NOTE — Progress Notes (Addendum)
[facility] NEPHROLOGY PROGRESS NOTE  Subjective:  Seen and examined in ICU. NE remains at 28. I/Os 3.8 / 3.1 yesterday.  Pt is awake.  Mother is bedside - requesting 2728, will do.    Objective Vital signs in last 24 hours: Vitals:   02/14/23 1600 02/14/23 1615 02/14/23 1630 02/14/23 1645  BP:      Pulse:      Resp: (!) 43 (!) 38 (!) 35 (!) 31  Temp: 98.1 F (36.7 C)     TempSrc: Axillary     SpO2: 100%     Weight:      Height:         Physical Exam: chronically ill looking male,  O2 Heart:RRR, s1s2 nl Lungs:coarse breath sound b/l Abdomen: soft, +bs Extremities: 1+ bilat hip edema  Dialysis Access: RIJ TDC intact   OP HD: [facility] TTS  4h   180kg  425/1.5  TDC  Na citrate cath lock (no heparin) - last OP HD was 6/25, post wt 193.8kg - consistently left HD 8-12kg up at least the prior 3 wks - sensipar 60mg  three times per week - hectorol 4 mcg IV three times per week - mircera 200 mcg  IV q 2 wks, last 6/13, due 6/27    Assessment/ Plan:  # Hypotension - remains pressor dependent on NE and vaso 0.04; also getting midodrine 30 tid.   # Septic shock/ pneumococcal bacteremia: complex course, ID following, has defervesced. Last blood culture 7/23 negative.   # Acute hypoxic and hypercapnic respiratory failure: patient is now extubated. Stable on HFNC  # ESRD - usual HD is TTS. Required emergent HD on 6/29 because of fluid overload.  Then developed shock and afib/ RVR requiring high doses of pressors.  Started CRRT on 6/29. Cont CRRT given pressor dependence at this time.  # Anemia of CKD:  on max ESA with variable Hbs, generally in the 9s.   # MBD ckd - on outpt cinacalcet, holding VDRA in setting of hyper Ca.   # Hypercalcemia: Noted patient has high corrected calcium level in 11s but stable, complication of citrate anticoagulation with normal ionized systemic Ca++.  He is on citrate protocol with calcium gluconate, and we are using 0 Ca fluids for  post HD IVF's and dialysate.   # Volume -remains overloaded, tolerating net neg 50/hr at this time.  Cont CRRT for volume removal at thsi time.   # CRRT anticoagulation - pt required change to citrate protocol for frequent clotting started 7/06.  We continue using citrate protocol due to excessive clotting and intolerance to heparin.   # Acute on chronic systolic CHF, nonischemic: EF 64-33%.  #A-fib with RVR with hypotension: S/P cardioversion 7/5 -> NSR -> afib RVR 7/6 early AM refractory to [facility] x3 + amiodarone. Persistent afib w/ HR > 100  Will complete ESRD 2728 today and involve SW to get it to the right place. Addendum:  pt has been ESRD for some time --> contacted his outpt HD unit Social worker to send a copy of his 2728 to the social security office as requested.  Jasmine December said she will complete this task.  Estill Bakes MD [facility] Pager [phone]   Recent Labs  Lab 02/21/23 1538 02/22/23 0209 02/22/23 0421 02/22/23 0831 02/22/23 0835  HGB  --    < > 8.1* 10.9* 8.8*  ALBUMIN 2.1*  --  2.0*  --   --   CALCIUM 10.3  --  8.6*  --   --  PHOS 5.0*  --  4.1  --   --   CREATININE 2.39*   < > 2.58* 2.00* 2.60*  K 4.9   < > 4.6 4.5 4.5   < > = values in this interval not displayed.    Inpatient medications:  apixaban  5 mg Per Tube BID   Chlorhexidine Gluconate Cloth  6 each Topical Q0600   darbepoetin (ARANESP) injection - DIALYSIS  200 mcg Subcutaneous Q Sat-1800   famotidine  20 mg Per Tube Daily   feeding supplement (PROSource TF20)  60 mL Per Tube Q4H   fiber supplement (BANATROL TF)  60 mL Per Tube BID   hydrOXYzine  10 mg Per Tube TID   insulin aspart  0-9 Units Subcutaneous Q4H   insulin aspart  2 Units Subcutaneous Q4H   insulin glargine-yfgn  12 Units Subcutaneous Daily   lidocaine  1 patch Transdermal Q24H   melatonin  5 mg Per Tube QHS   midodrine  30 mg Oral TID WC   multivitamin  1 tablet Per Tube QHS   nutrition supplement (JUVEN)  1  packet Per Tube BID BM   mouth rinse  15 mL Mouth Rinse 4 times per day   QUEtiapine  100 mg Per Tube BID   sodium chloride flush  10-40 mL Intravenous Q12H   valproic acid  500 mg Oral BID    sodium chloride 10 mL/hr at 02/21/23 1444   anticoagulant sodium citrate     calcium gluconate 20 g in dextrose 5 % 1,000 mL infusion 50 mL/hr at 02/22/23 0700   citrate dextrose 3,000 mL (02/21/23 2345)   DAPTOmycin (CUBICIN) 900 mg in sodium chloride 0.9 % IVPB Stopped (02/21/23 1517)   dexmedetomidine (PRECEDEX) IV infusion 1 mcg/kg/hr (02/22/23 0757)   feeding supplement (VITAL 1.5 CAL) 50 mL/hr at 02/22/23 0700   norepinephrine (LEVOPHED) Adult infusion 28 mcg/min (02/22/23 0700)   prismasol B22GK 4/0 1,500 mL/hr at 02/22/23 0840   prismasol B22GK 4/0 600 mL/hr at 02/21/23 0911   vasopressin 0.04 Units/min (02/22/23 0700)   sodium chloride, acetaminophen **OR** acetaminophen, anticoagulant sodium citrate, bisacodyl, docusate, fentaNYL (SUBLIMAZE) injection, ondansetron (ZOFRAN) IV, mouth rinse, polyethylene glycol, polyvinyl alcohol, senna-docusate, sodium chloride flush, surgical lubricant

## 2023-02-22 NOTE — Progress Notes (Signed)
NAME:  Phillip Young, MRN:  [id number], DOB:  1986/04/09, LOS: 32 ADMISSION DATE:  01/21/2023, CONSULTATION DATE:  01/22/23 REFERRING MD:  Danise Edge, CHIEF COMPLAINT:  SOB   History of Present Illness:  37 year old man well known to the PCCM and AHF services who presented to [facility] 6/28 with fevers, joint pains, SOB. PMHx significant for ESRD, NICM, refractory AFib, ?amio-induced lung toxicity. Patient reportedly missed HD on Thursday (has a history of leaving HD early). Reportedly ~10kg over dry weight with worsening breathing status, AFib/RVR and borderline BP.  PCCM consulted for ICU admission/further evaluation and management.  Pertinent Medical History:   Past Medical History:  Diagnosis Date   Acute on chronic respiratory failure with hypoxia ([name]) 04/21/2021   Acute on chronic systolic (congestive) heart failure ([name]) 02/26/2020   Amiodarone toxicity    Anemia    Atrial flutter ([name])    Biventricular congestive heart failure ([name])    Chronic hypoxemic respiratory failure ([name])    Class 3 severe obesity due to excess calories with serious comorbidity and body mass index (BMI) of 50.0 to 59.9 in adult [facility]) 02/26/2020   Essential hypertension 02/26/2020   GERD without esophagitis 02/26/2020   Hidradenitis suppurativa 02/26/2020   NICM (nonischemic cardiomyopathy) ([name])    Obesity hypoventilation syndrome ([name])    OSA (obstructive sleep apnea)    PAF (paroxysmal atrial fibrillation) ([name])    Pneumonia    Prediabetes 02/26/2020   Significant Hospital Events: Including procedures, antibiotic start and stop dates in addition to other pertinent events   6/28 Admit, emergent HD 6/29 Afib/RVR, shock, ICU transfer 6/30 Intubated and placed on vasopressor support and started on CRRT 7/1 Remains intubated on CRRT continue with pressor support 7/2 Agitated with SAT, remains on CRRT and low dose levophed 7/3 low dose NE, ongoing CRRT 7/4 Attempted SBT this am, apneic but severely agitated,  desaturated, HR up 130/140s.  Re-sedated but HR remains 140's, NE up from 6 to . NSR on EKG.  Was pulling 50-100on UF, since kept even for now.  CVP 8 7/5 DCCV again attempted this a.m. with success, remains on norepinephrine and vasopressin.  7/6 went into A-fib with RVR, attempted DCCV x 3, started on amiodarone infusion after amiodarone bolus 7/7 Multiple DCCV attempts without success. TF held due to vomiting. Tx denied to [facility]  7/8 KUB looked OK. No further vomiting. Tolerating TF. Large BM overnight.  7/9 vasopressors back up, remains rapid afib/ flutter, Bmx 1, wua causing hemodynamic instability 7/10 weaned sedation on mothers request, HR remains 140-220s, tmax 100.6, DNR, no escalation.  Heart failure signed off, not a candidate for advanced heart failure therapies at this time 7/12 [facility] denied transfer, CODE STATUS changed from DNR back to full code 7/13 Amiodarone restarted with ESR trend  7/16 Amio stopped with no improvement see in rate control  7/20 extubated 7/22 Issues with agitation post extubation, improved with Precedex. Tmax 101, respiratory culture with moderate yeast and fluconazole started 7/23 ID consulted  7/24 ID stopped fluconazole and started Daptomycin for fever of unknown origin  7/25 Intermittent BIPAP need 7/28 Remains on vasopressor support with Levophed and vasopressin. Refractory A-fib with heart rate in 140s to 160s 7/29 CVC dislodged during attempts to ambulate, CRRT stopped and central access utilized for both levo and vaso.  PICC line order placed 7/30 PICC placed overnight, back on CRRT and levophed/.vaso and CRRT  Interim History / Subjective:  Pt awake but somewhat sleepy after pain medication for PICC placement  Objective:  Blood pressure 91/65, pulse (!) 137, temperature 97.9 F (36.6 C), temperature source Axillary, resp. rate (!) 26, height 6' (1.829 m), weight (!) 179 kg, SpO2 95%. CVP:  [11 mmHg-26 mmHg] 16 mmHg      Intake/Output  Summary (Last 24 hours) at 02/22/2023 1224 Last data filed at 02/22/2023 1200 Gross per 24 hour  Intake 7051.36 ml  Output 2757 ml  Net 4294.36 ml   Filed Weights   02/19/23 0600 02/21/23 0600 02/22/23 0600  Weight: (!) 180.2 kg (!) 178.2 kg (!) 179 kg    General:  critically ill obese M, resting in bed in no acute distress HEENT: MM pink/moist, sclera mildly injected  Neuro: somnolent but arousable, answering questions and following commands  CV: s1s2 tachycardic, irregular rate 140-150, no m/r/g PULM:  diminished throughout with shallow inspiration, no rhonchi or wheezing  GI: soft, non-tender  Extremities: warm/dry, no edema  Skin: no rashes or lesions   Labs Hgb 8.1 Creatinine 2.5 Na 131 Glucose 205 Coox 40%   Resolved Hospital Problem List:  Hematuria due to traumatic Foley insertion Pneumococcal bacteremia Fever of unknown origin   Assessment & Plan:     Acute-on-chronic hypoxemic respiratory failure Bilateral multifocal pneumonia OHS/OSA Noncompliant with CPAP Initially strep and superimposed HCAP -Continue supplemental oxygen with sat goal greater than 90%, currently on 15L HFNC -Head of bed elevated 30 degrees -Follow intermittent chest x-ray and ABG.   -completed Linezolid and Meropenem, ID following and on Daptomycin -continue to mobilize as able, though efforts limited by deconditioning and habitus -Bipap prn     Acute on chronic HFrEF with pulmonary edema Persistent A-fib/flutter with RVR Nonischemic cardiomyopathy EF less than 20% Mixed shock septic/cardiogenic Refractory, with previous amio induced lung and liver toxicity (05/2022). Amio again trailed from 7/13-7/16 with little improvement seen in rate  Many conversations regarding limited therapeutic options and HF team signed off 7/10, mother and patient feel that he can improve and want him transferred to another facility -coox remains low, 40% -we have been unable to wean off high dose  Levo and vaso with marked hypotension when pressors decreased -Continue high dose Midodrine  -Continuous telemetry  -continue Eliquis  End-stage renal disease on hemodialysis Recurrent metabolic acidosis, improving Metabolic bone disease  Hypercalcemia  -Nephrology following, appreciate assistance -Remains on CRRT since 6/29, on hold am 7/29 due to lost access, now resumed post PICC  -Follow renal function, no current signs of renal recovery -remains volume overloaded tolerating net neg 50cc/hr    Hypervolemic hyponatremia Hyperphosphatemia Hyperkalemia  Hypocalcemia -trend and replete as needed    Anemia  -Trend CBC  -Transfuse per protocol  -Hgb goal > 7  Skin breakdown -Perineum, pt refusing rectal tube -continue local wound care   Hyperglycemia -continue Semglee and SSI   Long discussion with mother today, she has many complaints about her son's care here and would like to seek transfer to another facility, preferably [facility] in [place].  Discussed with Dr. Denese Killings who will call and relay family request though has re-iterated that there is likely minimal benefit or change in therapeutic options with transfer.   Best Practice (right click and "Reselect all SmartList Selections" daily)   Diet/type: Start trickle tube feeds DVT prophylaxis: DOAC GI prophylaxis: Famotidine Lines: Yes still needed Foley: n/a Code Status:  Full Last date of multidisciplinary goals of care: 7/30 continue full code and full scope of care   CRITICAL CARE Performed by: Darcella Gasman Leyah Bocchino  Total critical care  time: 45 minutes  Critical care time was exclusive of separately billable procedures and treating other patients.  Critical care was necessary to treat or prevent imminent or life-threatening deterioration.  Critical care was time spent personally by me on the following activities: development of treatment plan with patient and/or surrogate as well as nursing, discussions with  consultants, evaluation of patient's response to treatment, examination of patient, obtaining history from patient or surrogate, ordering and performing treatments and interventions, ordering and review of laboratory studies, ordering and review of radiographic studies, pulse oximetry and re-evaluation of patient's condition.    Darcella Gasman Dwayne Begay, PA-C [facility] See Amion for pager If no response to pager , please call [phone] until 7pm After 7:00 pm call Elink  [phone]

## 2023-02-22 NOTE — Progress Notes (Addendum)
I have seen and examined the patient. I have personally reviewed the clinical findings, laboratory findings, microbiological data and imaging studies. The assessment and treatment plan was discussed with the Nurse Practitioner Jeanine Luz. I agree with her/his recommendations except following additions/corrections.  Afebrile Exam - morbidly obese chronically ill appearing patient lying in bed. Sleepy but arousable             CVS- s1s2 Irregular rhythm, tahcycardic             Resp- decreased air entry in bases, on HFNC             Abdomen - soft, non tender              Extremity - no edema             Skin - no rashes, HD catheter, art line and PICC +  No leukocytosis  On pressors, likely more related to cardiogenic shock  S/p 7 days of presumed line infection today s/p accidental removal on 7/29 and PICC placement Recommend wound care for bilateral buttock wounds ID available as needed, recall with questions.   I have personally spent 50 minutes involved in face-to-face and non-face-to-face activities for this patient on the day of the visit. Professional time spent includes the following activities: Preparing to see the patient (review of tests), Obtaining and/or reviewing separately obtained history (admission/discharge record), Performing a medically appropriate examination and/or evaluation , Ordering medications/tests/procedures, referring and communicating with other health care professionals, Documenting clinical information in the EMR, Independently interpreting results (not separately reported), Communicating results to the patient/family/caregiver, Counseling and educating the patient/family/caregiver and Care coordination (not separately reported).   Odette Fraction, MD Infectious Disease Physician [facility] [address] [place], [place] [address] Phone: [phone]  Fax: [phone]   [facility]  Date of Admission:  01/21/2023     Total days of antibiotics 28         ASSESSMENT:  Mr. Whitney has continued to remain fever free and completed 7 days of daptomycin for fevers of unclear origin with possible central line infection which was removed on 7/29. Does have bilateral buttocks and posterior thigh wounds that do are macerated without obvious infection. Will stop daptomycin and monitor off antibiotics at this time and will watch fever curve. Continue wound care per Wound RN recommendations and continue frequent turning. Remains on CRRT per Nephrology and on vasopressors. Remaining medical and supportive care per CCM. Plan of care discussed with mother at bedside.   PLAN:  Discontinue daptomycin. Monitor fever curve.  Wound care per RN recommendations.  Remaining medical and supportive care per Nephrology and CCM.   I have personally spent 24 minutes involved in face-to-face and non-face-to-face activities for this patient on the day of the visit. Professional time spent includes the following activities: Preparing to see the patient (review of tests), Obtaining and/or reviewing separately obtained history (admission/discharge record), Performing a medically appropriate examination and/or evaluation , Ordering medications/tests/procedures, referring and communicating with other health care professionals, Documenting clinical information in the EMR, Independently interpreting results (not separately reported), Communicating results to the patient/family/caregiver, Counseling and educating the patient/family/caregiver and Care coordination (not separately reported).    Principal Problem:   Acute hypoxic respiratory failure ([name]) Active Problems:   Morbid obesity ([name])   Chronic systolic CHF (congestive heart failure) ([name])   Cardiogenic shock ([name])   ESRD (end stage renal disease) (  [name])   ESRD (end stage renal disease) on dialysis ([name])   Hypervolemia   Septic  shock ([name])   Atrial fibrillation with RVR ([name])   Pressure injury of skin    apixaban  5 mg Per Tube BID   Chlorhexidine Gluconate Cloth  6 each Topical Q0600   darbepoetin (ARANESP) injection - DIALYSIS  200 mcg Subcutaneous Q Sat-1800   famotidine  20 mg Per Tube Daily   feeding supplement (PROSource TF20)  60 mL Per Tube Q4H   fiber supplement (BANATROL TF)  60 mL Per Tube BID   hydrOXYzine  10 mg Per Tube TID   insulin aspart  0-9 Units Subcutaneous Q4H   insulin aspart  2 Units Subcutaneous Q4H   insulin glargine-yfgn  12 Units Subcutaneous Daily   lidocaine  1 patch Transdermal Q24H   melatonin  5 mg Per Tube QHS   midodrine  30 mg Oral TID WC   multivitamin  1 tablet Per Tube QHS   nutrition supplement (JUVEN)  1 packet Per Tube BID BM   mouth rinse  15 mL Mouth Rinse 4 times per day   QUEtiapine  100 mg Per Tube BID   sodium chloride flush  10-40 mL Intravenous Q12H   valproic acid  500 mg Oral BID    SUBJECTIVE:  Afebrile with no acute events. Mother at bedside.   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  Respiratory:  Negative for cough, shortness of breath and wheezing.   Cardiovascular:  Negative for chest pain and leg swelling.  Gastrointestinal:  Negative for abdominal pain, constipation, diarrhea, nausea and vomiting.  Skin:  Negative for rash.      OBJECTIVE: Vitals:   02/22/23 0600 02/22/23 0716 02/22/23 0748 02/22/23 0800  BP:       Pulse: (!) 142  (!) 158 (!) 152  Resp: (!) 25  (!) 32 (!) 26  Temp:  98.7 F (37.1 C)    TempSrc:  Oral    SpO2: 93%  97% 95%  Weight: (!) 179 kg     Height:       Body mass index is 53.52 kg/m.  Physical Exam Constitutional:      General: He is not in acute distress.    Appearance: He is well-developed. He is obese. He is ill-appearing.     Interventions: Nasal cannula in place.     Comments: Lying in bed with head of bed elevated; on CRRT.   Cardiovascular:     Rate and Rhythm: Tachycardia present. Rhythm irregularly irregular.     Heart sounds: Normal heart sounds.  Pulmonary:     Effort: Pulmonary effort is normal. Tachypnea present.     Breath sounds: Normal breath sounds.  Skin:    General: Skin is warm.  Neurological:     Mental Status: He is alert.     Lab Results Lab Results  Component  Value Date   WBC 7.7 02/22/2023   HGB 8.8 (L) 02/22/2023   HCT 26.0 (L) 02/22/2023   MCV 105.5 (H) 02/22/2023   PLT 246 02/22/2023    Lab Results  Component Value Date   CREATININE 2.60 (H) 02/22/2023   BUN 51 (H) 02/22/2023   NA 132 (L) 02/22/2023   K 4.5 02/22/2023   CL 96 (L) 02/22/2023   CO2 20 (L) 02/22/2023    Lab Results  Component Value Date   ALT 11 01/21/2023   AST 18 01/21/2023   ALKPHOS 97 01/21/2023   BILITOT 2.3 (H) 01/21/2023     Microbiology: Recent Results (from the past 240 hour(s))  Culture, blood (Routine X 2) w Reflex to ID Panel     Status: None   Collection Time: 02/15/23  2:52 PM   Specimen: BLOOD LEFT HAND  Result Value Ref Range Status   Specimen Description BLOOD LEFT HAND  Final   Special Requests   Final    BOTTLES DRAWN AEROBIC ONLY Blood Culture results may not be optimal due to an inadequate volume of blood received in culture bottles   Culture   Final    NO GROWTH 5 DAYS Performed at [facility], [address] [place], [place] [address]    Report Status 02/20/2023 FINAL  Final  Culture, blood (Routine X 2) w  Reflex to ID Panel     Status: None   Collection Time: 02/15/23  2:59 PM   Specimen: BLOOD RIGHT HAND  Result Value Ref Range Status   Specimen Description BLOOD RIGHT HAND  Final   Special Requests   Final    BOTTLES DRAWN AEROBIC ONLY Blood Culture results may not be optimal due to an inadequate volume of blood received in culture bottles   Culture   Final    NO GROWTH 5 DAYS Performed at [facility], [address] [place], [place] [address]    Report Status 02/20/2023 FINAL  Final    Marcos Eke, NP [facility]  02/22/2023  10:17 AM

## 2023-02-22 NOTE — Progress Notes (Signed)
Received new order for patient after evaluating patient yesterday. Pt on caseload with plans to be seen one more time this week.   Lindon Romp OT Acute Rehabilitation Services Office [phone]

## 2023-02-23 LAB — POCT I-STAT, CHEM 8
BUN: 31 mg/dL — ABNORMAL HIGH (ref 6–20)
BUN: 32 mg/dL — ABNORMAL HIGH (ref 6–20)
BUN: 39 mg/dL — ABNORMAL HIGH (ref 6–20)
BUN: 30 mg/dL — ABNORMAL HIGH (ref 6–20)
BUN: 38 mg/dL — ABNORMAL HIGH (ref 6–20)
BUN: 34 mg/dL — ABNORMAL HIGH (ref 6–20)
BUN: 38 mg/dL — ABNORMAL HIGH (ref 6–20)
Calcium, Ion: 0.37 mmol/L — CL (ref 1.15–1.40)
Calcium, Ion: 0.55 mmol/L — CL (ref 1.15–1.40)
Calcium, Ion: 1.09 mmol/L — ABNORMAL LOW (ref 1.15–1.40)
Calcium, Ion: 0.37 mmol/L — CL (ref 1.15–1.40)
Calcium, Ion: 1.11 mmol/L — ABNORMAL LOW (ref 1.15–1.40)
Calcium, Ion: 0.35 mmol/L — CL (ref 1.15–1.40)
Calcium, Ion: 1.08 mmol/L — ABNORMAL LOW (ref 1.15–1.40)
Chloride: 93 mmol/L — ABNORMAL LOW (ref 98–111)
Chloride: 94 mmol/L — ABNORMAL LOW (ref 98–111)
Chloride: 96 mmol/L — ABNORMAL LOW (ref 98–111)
Chloride: 94 mmol/L — ABNORMAL LOW (ref 98–111)
Chloride: 98 mmol/L (ref 98–111)
Chloride: 95 mmol/L — ABNORMAL LOW (ref 98–111)
Chloride: 97 mmol/L — ABNORMAL LOW (ref 98–111)
Creatinine, Ser: 1.7 mg/dL — ABNORMAL HIGH (ref 0.61–1.24)
Creatinine, Ser: 1.8 mg/dL — ABNORMAL HIGH (ref 0.61–1.24)
Creatinine, Ser: 2.4 mg/dL — ABNORMAL HIGH (ref 0.61–1.24)
Creatinine, Ser: 1.7 mg/dL — ABNORMAL HIGH (ref 0.61–1.24)
Creatinine, Ser: 2.2 mg/dL — ABNORMAL HIGH (ref 0.61–1.24)
Creatinine, Ser: 1.6 mg/dL — ABNORMAL HIGH (ref 0.61–1.24)
Creatinine, Ser: 2.2 mg/dL — ABNORMAL HIGH (ref 0.61–1.24)
Glucose, Bld: 149 mg/dL — ABNORMAL HIGH (ref 70–99)
Glucose, Bld: 193 mg/dL — ABNORMAL HIGH (ref 70–99)
Glucose, Bld: 196 mg/dL — ABNORMAL HIGH (ref 70–99)
Glucose, Bld: 172 mg/dL — ABNORMAL HIGH (ref 70–99)
Glucose, Bld: 209 mg/dL — ABNORMAL HIGH (ref 70–99)
Glucose, Bld: 144 mg/dL — ABNORMAL HIGH (ref 70–99)
Glucose, Bld: 186 mg/dL — ABNORMAL HIGH (ref 70–99)
HCT: 25 % — ABNORMAL LOW (ref 39.0–52.0)
HCT: 31 % — ABNORMAL LOW (ref 39.0–52.0)
HCT: 31 % — ABNORMAL LOW (ref 39.0–52.0)
HCT: 24 % — ABNORMAL LOW (ref 39.0–52.0)
HCT: 30 % — ABNORMAL LOW (ref 39.0–52.0)
HCT: 25 % — ABNORMAL LOW (ref 39.0–52.0)
HCT: 30 % — ABNORMAL LOW (ref 39.0–52.0)
Hemoglobin: 10.5 g/dL — ABNORMAL LOW (ref 13.0–17.0)
Hemoglobin: 10.5 g/dL — ABNORMAL LOW (ref 13.0–17.0)
Hemoglobin: 8.5 g/dL — ABNORMAL LOW (ref 13.0–17.0)
Hemoglobin: 10.2 g/dL — ABNORMAL LOW (ref 13.0–17.0)
Hemoglobin: 8.2 g/dL — ABNORMAL LOW (ref 13.0–17.0)
Hemoglobin: 10.2 g/dL — ABNORMAL LOW (ref 13.0–17.0)
Hemoglobin: 8.5 g/dL — ABNORMAL LOW (ref 13.0–17.0)
Potassium: 4.3 mmol/L (ref 3.5–5.1)
Potassium: 4.4 mmol/L (ref 3.5–5.1)
Potassium: 4.6 mmol/L (ref 3.5–5.1)
Potassium: 4.2 mmol/L (ref 3.5–5.1)
Potassium: 4.4 mmol/L (ref 3.5–5.1)
Potassium: 4.1 mmol/L (ref 3.5–5.1)
Potassium: 4.2 mmol/L (ref 3.5–5.1)
Sodium: 132 mmol/L — ABNORMAL LOW (ref 135–145)
Sodium: 133 mmol/L — ABNORMAL LOW (ref 135–145)
Sodium: 135 mmol/L (ref 135–145)
Sodium: 131 mmol/L — ABNORMAL LOW (ref 135–145)
Sodium: 134 mmol/L — ABNORMAL LOW (ref 135–145)
Sodium: 133 mmol/L — ABNORMAL LOW (ref 135–145)
Sodium: 135 mmol/L (ref 135–145)
TCO2: 22 mmol/L (ref 22–32)
TCO2: 23 mmol/L (ref 22–32)
TCO2: 23 mmol/L (ref 22–32)
TCO2: 21 mmol/L — ABNORMAL LOW (ref 22–32)
TCO2: 22 mmol/L (ref 22–32)
TCO2: 24 mmol/L (ref 22–32)
TCO2: 25 mmol/L (ref 22–32)

## 2023-02-23 LAB — GLUCOSE, CAPILLARY
Glucose-Capillary: 130 mg/dL — ABNORMAL HIGH (ref 70–99)
Glucose-Capillary: 115 mg/dL — ABNORMAL HIGH (ref 70–99)
Glucose-Capillary: 135 mg/dL — ABNORMAL HIGH (ref 70–99)
Glucose-Capillary: 107 mg/dL — ABNORMAL HIGH (ref 70–99)
Glucose-Capillary: 130 mg/dL — ABNORMAL HIGH (ref 70–99)

## 2023-02-23 LAB — MAGNESIUM: Magnesium: 1.8 mg/dL (ref 1.7–2.4)

## 2023-02-23 MED ORDER — LORAZEPAM 1 MG PO TABS
2.0000 mg | ORAL_TABLET | ORAL | Status: DC | PRN
  Administered 2023-02-23 – 2023-02-24 (×5): 2 mg via ORAL
  Filled 2023-02-23 (×5): qty 2

## 2023-02-23 MED ORDER — DIGOXIN 0.25 MG/ML IJ SOLN
0.2500 mg | Freq: Once | INTRAMUSCULAR | Status: AC
  Administered 2023-02-23: 0.25 mg via INTRAVENOUS
  Filled 2023-02-23: qty 2

## 2023-02-23 MED ORDER — LORAZEPAM 1 MG PO TABS
2.0000 mg | ORAL_TABLET | Freq: Once | ORAL | Status: AC
  Administered 2023-02-23: 2 mg via ORAL
  Filled 2023-02-23: qty 2

## 2023-02-23 MED ORDER — HALOPERIDOL LACTATE 5 MG/ML IJ SOLN
5.0000 mg | Freq: Once | INTRAMUSCULAR | Status: AC
  Administered 2023-02-23: 5 mg via INTRAVENOUS
  Filled 2023-02-23: qty 1

## 2023-02-23 NOTE — Progress Notes (Signed)
iCa post-filter 0.35, systemic iCa 1.08. Both in range, no changes made per protocol. Will recheck in 8 hours.

## 2023-02-23 NOTE — Progress Notes (Signed)
[facility] NEPHROLOGY PROGRESS NOTE  Subjective:  Seen and examined in ICU. Remains on high dose vasopressor support. I/Os 7.8 / 4.7 yesterday. CVP 14 this AM.  Pt is awake. C/o leg cramps. Family requested transfer to [facility] - PCCM MD reached out and it was declined due to no additional options to offer there vs here.    Objective Vital signs in last 24 hours: Vitals:   02/14/23 1600 02/14/23 1615 02/14/23 1630 02/14/23 1645  BP:      Pulse:      Resp: (!) 43 (!) 38 (!) 35 (!) 31  Temp: 98.1 F (36.7 C)     TempSrc: Axillary     SpO2: 100%     Weight:      Height:         Physical Exam: chronically ill looking male, [place] O2 Heart:RRR, s1s2 nl Lungs:coarse breath sound b/l Abdomen: soft, +bs Extremities: 1+ bilat hip edema  Dialysis Access: RIJ TDC intact   OP HD: [facility] TTS  4h   180kg  425/1.5  TDC  Na citrate cath lock (no heparin) - last OP HD was 6/25, post wt 193.8kg - consistently left HD 8-12kg up at least the prior 3 wks - sensipar 60mg  three times per week - hectorol 4 mcg IV three times per week - mircera 200 mcg  IV q 2 wks, last 6/13, due 6/27    Assessment/ Plan:  # Hypotension - remains pressor dependent on NE and vaso 0.04; also getting midodrine 30 tid.   # Septic shock/ pneumococcal bacteremia: complex course, ID following, has defervesced. Last blood culture 7/23 negative  # Acute hypoxic and hypercapnic respiratory failure: patient is now extubated. Stable on HFNC  # ESRD - usual HD is TTS. Required emergent HD on 6/29 because of fluid overload.  Then developed shock and afib/ RVR requiring high doses of pressors.  Started CRRT on 6/29. Cont CRRT given pressor dependence at this time.  # Anemia of CKD:  on max ESA with variable Hbs, generally in the 9s.   # MBD ckd - on outpt cinacalcet, holding VDRA in setting of hyper Ca.   # Hypercalcemia: Noted patient has high corrected calcium level in 11s but stable, complication of  citrate anticoagulation with normal ionized systemic Ca++.  He is on citrate protocol with calcium gluconate, and we are using 0 Ca fluids for post HD IVF's and dialysate.   # Volume -remains overloaded, tolerating net neg 50/hr at this time.  Cont CRRT for volume removal at thsi time.   # CRRT anticoagulation - pt required change to citrate protocol for frequent clotting started 7/06.  We continue using citrate protocol due to excessive clotting and intolerance to heparin.   # Acute on chronic systolic CHF, nonischemic: EF 44-01%.  #A-fib with RVR with hypotension: S/P cardioversion 7/5 -> NSR -> afib RVR 7/6 early AM refractory to [facility] x3 + amiodarone. Persistent afib/flutter w/ HR > 100   Estill Bakes MD [facility] Pager [phone]   Recent Labs  Lab 02/22/23 1610 02/22/23 1620 02/23/23 0406 02/23/23 0438 02/23/23 0440 02/23/23 0446  HGB 10.9*   < > 7.9*   < > 8.5* 10.5*  ALBUMIN 2.1*  --  2.0*  --   --   --   CALCIUM 9.4  --  9.6  --   --   --   PHOS 4.6  --  4.7*  --   --   --  CREATININE 2.40*  1.90*   < > 2.05*   < > 2.40* 1.70*  K 4.4  4.4   < > 4.3   < > 4.6 4.3   < > = values in this interval not displayed.    Inpatient medications:  apixaban  5 mg Per Tube BID   Chlorhexidine Gluconate Cloth  6 each Topical Q0600   darbepoetin (ARANESP) injection - DIALYSIS  200 mcg Subcutaneous Q Sat-1800   digoxin  0.25 mg Intravenous Once   famotidine  20 mg Per Tube Daily   feeding supplement (PROSource TF20)  60 mL Per Tube Q4H   fiber supplement (BANATROL TF)  60 mL Per Tube BID   hydrOXYzine  10 mg Per Tube TID   insulin aspart  0-9 Units Subcutaneous Q4H   insulin aspart  2 Units Subcutaneous Q4H   insulin glargine-yfgn  12 Units Subcutaneous Daily   lidocaine  1 patch Transdermal Q24H   melatonin  5 mg Per Tube QHS   midodrine  30 mg Oral TID WC   multivitamin  1 tablet Per Tube QHS   nutrition supplement (JUVEN)  1 packet Per Tube BID BM   mouth  rinse  15 mL Mouth Rinse 4 times per day   QUEtiapine  100 mg Per Tube BID   sodium chloride flush  10-40 mL Intravenous Q12H   valproic acid  500 mg Oral BID    sodium chloride 10 mL/hr at 02/21/23 1444   anticoagulant sodium citrate     calcium gluconate 20 g in dextrose 5 % 1,000 mL infusion 50 mL/hr at 02/23/23 0800   citrate dextrose 3,000 mL (02/23/23 0611)   dexmedetomidine (PRECEDEX) IV infusion 1 mcg/kg/hr (02/23/23 0800)   feeding supplement (VITAL 1.5 CAL) 50 mL/hr at 02/23/23 0800   norepinephrine (LEVOPHED) Adult infusion 24 mcg/min (02/23/23 0800)   prismasol B22GK 4/0 1,500 mL/hr at 02/23/23 0807   prismasol B22GK 4/0 600 mL/hr at 02/23/23 0612   vasopressin 0.04 Units/min (02/23/23 0825)   sodium chloride, acetaminophen **OR** acetaminophen, anticoagulant sodium citrate, bisacodyl, docusate, fentaNYL (SUBLIMAZE) injection, ondansetron (ZOFRAN) IV, mouth rinse, polyethylene glycol, polyvinyl alcohol, senna-docusate, sodium chloride flush, surgical lubricant

## 2023-02-23 NOTE — Progress Notes (Signed)
eLink Physician-Brief Progress Note Patient Name: Phillip Young DOB: 11/19/1985 MRN: [id number]   Date of Service  02/23/2023  HPI/Events of Note  Patient is very anxious, he received Ativan yesterday with good effect (without excessive sedation).  eICU Interventions  Ativan 2 mg via NG tube x 1 ordered.        [name] 02/23/2023, 1:33 AM

## 2023-02-23 NOTE — Progress Notes (Signed)
Post filter ionized calcium .37, Systemic ionized calcium 1.1. No changes made per protocol.

## 2023-02-23 NOTE — Progress Notes (Signed)
Physical Therapy Treatment Patient Details Name: Phillip Young MRN: [id number] DOB: 1986-04-21 Today's Date: 02/23/2023   History of Present Illness Pt is a 37 y/o M presenting to ED on 6/28 with fever, joint pain, and SOB after missing HD. Admitted for acute hypoxic respiratory failure. Intubated and started on CRRT, vasopressor support on 6/30. Extubated 7/20. Underwent DCCV 7/5, 7/6, and 7/7 without success. PMH includes ESRD, NICM, refractory A fib, HFrEF    PT Comments  Pt remains in afib and continues to require vasopressors. Pt continues to perseverate on standing up and walking. Pt placed in chair position using egress feature on bed and worked on pt pulling self forward and maintaining upright balance however unable to maintain >2 secs. Noted SOB and inc HR to 170s. MaxAx2 to scoot to edge of egress until feet were on the floor. Pt attempted numerous times to stand however was unable. Pt continues to require 2 people to roll L/R, and continues to have wounds all over buttocks. Acute PT to cont to follow.    If plan is discharge home, recommend the following: Two people to help with walking and/or transfers;Two people to help with bathing/dressing/bathroom;Direct supervision/assist for medications management;Direct supervision/assist for financial management;Assist for transportation;Help with stairs or ramp for entrance   Can travel by private vehicle     No  Equipment Recommendations   (TBD at next venue)    Recommendations for Other Services       Precautions / Restrictions Precautions Precautions: Fall Precaution Comments: CRRT, NGT, multiple lines, wounds on hip/back/buttocks Restrictions Weight Bearing Restrictions: No     Mobility  Bed Mobility Overal bed mobility: Needs Assistance Bed Mobility: Rolling Rolling: Max assist, +2 for physical assistance (rolling to L and R for dressing changes to buttocks)         General bed mobility comments: utilized bed-egress  position, pt able to tolerate for 15 min. pt perseverating on trying to stand up. PT gave pt numerous tries but informed pt that PT and tech could not lift the him and he would have to do it on his own. Pt attempted x 3 and then realized, "I can't push up. I am tired." pt maxAx2-3 to slide up in the bed    Transfers                   General transfer comment: pt maxAx2 to scoot to edge of egress so feet could touch floor. worked on having pt pull self forward via pulling on the rails however pt requiring assist to place R UE on rail and demo'd limited ability to use functionally to assist with transfer. RN present to monitor CRRT line and vitals.    Ambulation/Gait               General Gait Details: unable this date   Stairs             Wheelchair Mobility     Tilt Bed    Modified Rankin (Stroke Patients Only)       Balance Overall balance assessment: Needs assistance Sitting-balance support: Feet supported Sitting balance-Leahy Scale: Zero Sitting balance - Comments: unable to sit without support posteriorly                                    Cognition Arousal/Alertness: Awake/alert Behavior During Therapy: Restless Overall Cognitive Status: No family/caregiver present to determine baseline cognitive functioning  General Comments: pt A&O x4 however irrational with decreased insight to safety and deficits. Pt with poor understanding of his medical condition and the treatment he is receiving.        Exercises General Exercises - Lower Extremity Ankle Circles/Pumps: AAROM, Both, Seated Long Arc Quad: AROM, Both, 5 reps, Seated (unable to hold at end range on R)    General Comments General comments (skin integrity, edema, etc.): HR in 160s-170s, map >65 t/o session. RN present to management lines and watch vitals with PT and tech assisted pt with mobilizing      Pertinent Vitals/Pain Pain  Assessment Pain Assessment: Faces Faces Pain Scale: Hurts whole lot Pain Location: buttocks Pain Descriptors / Indicators: Discomfort, Constant    Home Living                          Prior Function            PT Goals (current goals can now be found in the care plan section) Acute Rehab PT Goals Patient Stated Goal: stand up and walk PT Goal Formulation: With patient Time For Goal Achievement: 03/07/23 Potential to Achieve Goals: Fair Progress towards PT goals: Progressing toward goals    Frequency    Min 1X/week      PT Plan Current plan remains appropriate    Co-evaluation              AM-PAC PT "6 Clicks" Mobility   Outcome Measure  Help needed turning from your back to your side while in a flat bed without using bedrails?: Total Help needed moving from lying on your back to sitting on the side of a flat bed without using bedrails?: Total Help needed moving to and from a bed to a chair (including a wheelchair)?: Total Help needed standing up from a chair using your arms (e.g., wheelchair or bedside chair)?: Total Help needed to walk in hospital room?: Total Help needed climbing 3-5 steps with a railing? : Total 6 Click Score: 6    End of Session Equipment Utilized During Treatment: Oxygen Activity Tolerance: Patient tolerated treatment well Patient left: in bed;with call bell/phone within reach;with nursing/sitter in room Nurse Communication: Mobility status (RN present) PT Visit Diagnosis: Unsteadiness on feet (R26.81)     Time: 0932-1020 PT Time Calculation (min) (ACUTE ONLY): 48 min  Charges:    $Therapeutic Exercise: 8-22 mins $Therapeutic Activity: 23-37 mins PT General Charges $$ ACUTE PT VISIT: 1 Visit                     Lewis Shock, PT, DPT Acute Rehabilitation Services Secure chat preferred Office #: [phone]    Iona Hansen 02/23/2023, 11:06 AM

## 2023-02-23 NOTE — Progress Notes (Signed)
NAME:  Phillip Young, MRN:  [id number], DOB:  11-Apr-1986, LOS: 33 ADMISSION DATE:  01/21/2023, CONSULTATION DATE:  01/22/23 REFERRING MD:  Danise Edge, CHIEF COMPLAINT:  SOB   History of Present Illness:  37 year old man well known to the PCCM and AHF services who presented to [facility] 6/28 with fevers, joint pains, SOB. PMHx significant for ESRD, NICM, refractory AFib, ?amio-induced lung toxicity. Patient reportedly missed HD on Thursday (has a history of leaving HD early). Reportedly ~10kg over dry weight with worsening breathing status, AFib/RVR and borderline BP.  PCCM consulted for ICU admission/further evaluation and management.  Pertinent Medical History:   Past Medical History:  Diagnosis Date   Acute on chronic respiratory failure with hypoxia ([name]) 04/21/2021   Acute on chronic systolic (congestive) heart failure ([name]) 02/26/2020   Amiodarone toxicity    Anemia    Atrial flutter ([name])    Biventricular congestive heart failure ([name])    Chronic hypoxemic respiratory failure ([name])    Class 3 severe obesity due to excess calories with serious comorbidity and body mass index (BMI) of 50.0 to 59.9 in adult [facility]) 02/26/2020   Essential hypertension 02/26/2020   GERD without esophagitis 02/26/2020   Hidradenitis suppurativa 02/26/2020   NICM (nonischemic cardiomyopathy) ([name])    Obesity hypoventilation syndrome ([name])    OSA (obstructive sleep apnea)    PAF (paroxysmal atrial fibrillation) ([name])    Pneumonia    Prediabetes 02/26/2020   Significant Hospital Events: Including procedures, antibiotic start and stop dates in addition to other pertinent events   6/28 Admit, emergent HD 6/29 Afib/RVR, shock, ICU transfer 6/30 Intubated and placed on vasopressor support and started on CRRT 7/1 Remains intubated on CRRT continue with pressor support 7/2 Agitated with SAT, remains on CRRT and low dose levophed 7/3 low dose NE, ongoing CRRT 7/4 Attempted SBT this am, apneic but severely agitated,  desaturated, HR up 130/140s.  Re-sedated but HR remains 140's, NE up from 6 to . NSR on EKG.  Was pulling 50-100on UF, since kept even for now.  CVP 8 7/5 DCCV again attempted this a.m. with success, remains on norepinephrine and vasopressin.  7/6 went into A-fib with RVR, attempted DCCV x 3, started on amiodarone infusion after amiodarone bolus 7/7 Multiple DCCV attempts without success. TF held due to vomiting. Tx denied to [facility]  7/8 KUB looked OK. No further vomiting. Tolerating TF. Large BM overnight.  7/9 vasopressors back up, remains rapid afib/ flutter, Bmx 1, wua causing hemodynamic instability 7/10 weaned sedation on mothers request, HR remains 140-220s, tmax 100.6, DNR, no escalation.  Heart failure signed off, not a candidate for advanced heart failure therapies at this time 7/12 [facility] denied transfer, CODE STATUS changed from DNR back to full code 7/13 Amiodarone restarted with ESR trend  7/16 Amio stopped with no improvement see in rate control  7/20 extubated 7/22 Issues with agitation post extubation, improved with Precedex. Tmax 101, respiratory culture with moderate yeast and fluconazole started 7/23 ID consulted  7/24 ID stopped fluconazole and started Daptomycin for fever of unknown origin  7/25 Intermittent BIPAP need 7/28 Remains on vasopressor support with Levophed and vasopressin. Refractory A-fib with heart rate in 140s to 160s 7/29 CVC dislodged during attempts to ambulate, CRRT stopped and central access utilized for both levo and vaso.  PICC line order placed 7/30 PICC placed overnight, back on CRRT and levophed/.vaso and CRRT 7/31 no changes, Dr. Denese Killings starting Digoxin  Interim History / Subjective:  No overnight events  Remains  on pressors Responding well to Ativan C/o gluteal pain  Objective:  Blood pressure (!) 107/59, pulse (!) 155, temperature 99 F (37.2 C), temperature source Axillary, resp. rate (!) 26, height 6' (1.829 m), weight (!) 179 kg,  SpO2 100%. CVP:  [14 mmHg-30 mmHg] 15 mmHg      Intake/Output Summary (Last 24 hours) at 02/23/2023 1105 Last data filed at 02/23/2023 1027 Gross per 24 hour  Intake 4551.33 ml  Output 4086.5 ml  Net 464.83 ml   Filed Weights   02/19/23 0600 02/21/23 0600 02/22/23 0600  Weight: (!) 180.2 kg (!) 178.2 kg (!) 179 kg    General:  critically ill obese M, resting in bed in no acute distress HEENT: MM pink/moist, sclera mildly injected  Neuro: awake but somnolent on precedex  CV: s1s2 tachycardic, irregular rate 140-150, no m/r/g PULM:  diminished throughout with shallow inspiration, no rhonchi or wheezing  GI: soft, non-tender  Extremities: warm/dry, no edema  Skin: no rashes or lesions   Labs Hgb 7.9 Creatinine 2.05 Na 130 Glucose 135 Coox 50%   Resolved Hospital Problem List:  Hematuria due to traumatic Foley insertion Pneumococcal bacteremia Fever of unknown origin   Assessment & Plan:     Acute-on-chronic hypoxemic respiratory failure Bilateral multifocal pneumonia OHS/OSA Noncompliant with CPAP at baseline Initially strep and superimposed HCAP -Continue supplemental oxygen with sat goal greater than 90%, currently on 15L HFNC -Head of bed elevated 30 degrees -Follow intermittent chest x-ray and ABG.   -completed Linezolid and Meropenem, ID following and on Daptomycin -continue to mobilize as able, though efforts limited by deconditioning and habitus, trying tilt bed -Bipap prn     Acute on chronic HFrEF with pulmonary edema Persistent A-fib/flutter with RVR Nonischemic cardiomyopathy EF less than 20% Mixed shock septic/cardiogenic Refractory, with previous amio induced lung and liver toxicity (05/2022). Amio again trailed from 7/13-7/16 with little improvement seen in rate  Many conversations regarding limited therapeutic options and HF team signed off 7/10, mother and patient feel that he can improve and want him transferred to another facility -coox  remains intermittently low, 50% today -we have been unable to wean off high dose Levo and vaso with marked hypotension when pressors decreased -Continue high dose Midodrine  -Continuous telemetry  -continue Eliquis -starting Digoxin, d/w Dr. Denese Killings despite ESRD likely warrants trial to see if HR can be improved -HR also improving with benzos  End-stage renal disease on hemodialysis Recurrent metabolic acidosis, improving Metabolic bone disease  Hypercalcemia  -Nephrology following, appreciate assistance -Remains on CRRT since 6/29, has PICC -Follow renal function, no current signs of renal recovery -remains volume overloaded tolerating net neg 50cc/hr    Hypervolemic hyponatremia Hyperphosphatemia Hyperkalemia  Hypocalcemia -trend and replete as needed    Anemia  -Trend CBC  -Transfuse per protocol  -Hgb goal > 7  Skin breakdown -Perineum, pt refusing rectal tube -continue local wound care   Hyperglycemia -continue Semglee and SSI  7/30 Long discussion with mother today, she has many complaints about her son's care here and would like to seek transfer to another facility, preferably [facility] in [place].  Discussed with Dr. Denese Killings who will call and relay family request though has re-iterated that there is likely minimal benefit or change in therapeutic options with transfer.  7/31 Mom updated by Dr. Denese Killings, no transfer available at [facility] (right click and "Reselect all SmartList Selections" daily)   Diet/type: TF DVT prophylaxis: DOAC GI prophylaxis: Famotidine Lines: Yes still needed  Foley: n/a Code Status:  Full Last date of multidisciplinary goals of care: 7/30 continue full code and full scope of care   CRITICAL CARE Performed by: Darcella Gasman Brylinn Teaney  Total critical care time: 30 minutes  Critical care time was exclusive of separately billable procedures and treating other patients.  Critical care was necessary to treat or prevent  imminent or life-threatening deterioration.  Critical care was time spent personally by me on the following activities: development of treatment plan with patient and/or surrogate as well as nursing, discussions with consultants, evaluation of patient's response to treatment, examination of patient, obtaining history from patient or surrogate, ordering and performing treatments and interventions, ordering and review of laboratory studies, ordering and review of radiographic studies, pulse oximetry and re-evaluation of patient's condition.    Darcella Gasman Linnet Bottari, PA-C [facility] See Amion for pager If no response to pager , please call [phone] until 7pm After 7:00 pm call Elink  [phone]

## 2023-02-23 NOTE — Plan of Care (Signed)
  Problem: Education: Goal: Knowledge of General Education information will improve Description: Including pain rating scale, medication(s)/side effects and non-pharmacologic comfort measures Outcome: Not Progressing   Problem: Health Behavior/Discharge Planning: Goal: Ability to manage health-related needs will improve Outcome: Not Progressing   Problem: Clinical Measurements: Goal: Ability to maintain clinical measurements within normal limits will improve Outcome: Not Progressing Goal: Will remain free from infection Outcome: Progressing

## 2023-02-24 DIAGNOSIS — N186 End stage renal disease: Secondary | ICD-10-CM | POA: Diagnosis not present

## 2023-02-24 DIAGNOSIS — R570 Cardiogenic shock: Secondary | ICD-10-CM | POA: Diagnosis not present

## 2023-02-24 DIAGNOSIS — J9601 Acute respiratory failure with hypoxia: Secondary | ICD-10-CM | POA: Diagnosis not present

## 2023-02-24 DIAGNOSIS — I4891 Unspecified atrial fibrillation: Secondary | ICD-10-CM | POA: Diagnosis not present

## 2023-02-24 LAB — POCT I-STAT 7, (LYTES, BLD GAS, ICA,H+H)
Acid-base deficit: 1 mmol/L (ref 0.0–2.0)
Bicarbonate: 24.2 mmol/L (ref 20.0–28.0)
Calcium, Ion: 1.1 mmol/L — ABNORMAL LOW (ref 1.15–1.40)
HCT: 28 % — ABNORMAL LOW (ref 39.0–52.0)
Hemoglobin: 9.5 g/dL — ABNORMAL LOW (ref 13.0–17.0)
O2 Saturation: 97 %
Patient temperature: 37.2
Potassium: 4.4 mmol/L (ref 3.5–5.1)
Sodium: 134 mmol/L — ABNORMAL LOW (ref 135–145)
TCO2: 25 mmol/L (ref 22–32)
pCO2 arterial: 41.7 mmHg (ref 32–48)
pH, Arterial: 7.372 (ref 7.35–7.45)
pO2, Arterial: 99 mmHg (ref 83–108)

## 2023-02-24 LAB — POCT I-STAT EG7
Acid-base deficit: 6 mmol/L — ABNORMAL HIGH (ref 0.0–2.0)
Bicarbonate: 24.3 mmol/L (ref 20.0–28.0)
Calcium, Ion: 0.38 mmol/L — CL (ref 1.15–1.40)
HCT: 35 % — ABNORMAL LOW (ref 39.0–52.0)
Hemoglobin: 11.9 g/dL — ABNORMAL LOW (ref 13.0–17.0)
O2 Saturation: 35 %
Patient temperature: 37.2
Potassium: 4.2 mmol/L (ref 3.5–5.1)
Sodium: 137 mmol/L (ref 135–145)
TCO2: 26 mmol/L (ref 22–32)
pCO2, Ven: 71.3 mmHg (ref 44–60)
pH, Ven: 7.142 — CL (ref 7.25–7.43)
pO2, Ven: 28 mmHg — CL (ref 32–45)

## 2023-02-24 LAB — POCT I-STAT, CHEM 8
BUN: 35 mg/dL — ABNORMAL HIGH (ref 6–20)
BUN: 35 mg/dL — ABNORMAL HIGH (ref 6–20)
BUN: 33 mg/dL — ABNORMAL HIGH (ref 6–20)
BUN: 38 mg/dL — ABNORMAL HIGH (ref 6–20)
Calcium, Ion: 0.41 mmol/L — CL (ref 1.15–1.40)
Calcium, Ion: 1.12 mmol/L — ABNORMAL LOW (ref 1.15–1.40)
Calcium, Ion: 0.34 mmol/L — CL (ref 1.15–1.40)
Calcium, Ion: 1.11 mmol/L — ABNORMAL LOW (ref 1.15–1.40)
Chloride: 94 mmol/L — ABNORMAL LOW (ref 98–111)
Chloride: 98 mmol/L (ref 98–111)
Chloride: 96 mmol/L — ABNORMAL LOW (ref 98–111)
Chloride: 97 mmol/L — ABNORMAL LOW (ref 98–111)
Creatinine, Ser: 1.8 mg/dL — ABNORMAL HIGH (ref 0.61–1.24)
Creatinine, Ser: 2.2 mg/dL — ABNORMAL HIGH (ref 0.61–1.24)
Creatinine, Ser: 1.4 mg/dL — ABNORMAL HIGH (ref 0.61–1.24)
Creatinine, Ser: 2 mg/dL — ABNORMAL HIGH (ref 0.61–1.24)
Glucose, Bld: 142 mg/dL — ABNORMAL HIGH (ref 70–99)
Glucose, Bld: 176 mg/dL — ABNORMAL HIGH (ref 70–99)
Glucose, Bld: 141 mg/dL — ABNORMAL HIGH (ref 70–99)
Glucose, Bld: 186 mg/dL — ABNORMAL HIGH (ref 70–99)
HCT: 26 % — ABNORMAL LOW (ref 39.0–52.0)
HCT: 30 % — ABNORMAL LOW (ref 39.0–52.0)
HCT: 30 % — ABNORMAL LOW (ref 39.0–52.0)
HCT: 37 % — ABNORMAL LOW (ref 39.0–52.0)
Hemoglobin: 10.2 g/dL — ABNORMAL LOW (ref 13.0–17.0)
Hemoglobin: 8.8 g/dL — ABNORMAL LOW (ref 13.0–17.0)
Hemoglobin: 10.2 g/dL — ABNORMAL LOW (ref 13.0–17.0)
Hemoglobin: 12.6 g/dL — ABNORMAL LOW (ref 13.0–17.0)
Potassium: 4 mmol/L (ref 3.5–5.1)
Potassium: 4.2 mmol/L (ref 3.5–5.1)
Potassium: 4.1 mmol/L (ref 3.5–5.1)
Potassium: 4.4 mmol/L (ref 3.5–5.1)
Sodium: 132 mmol/L — ABNORMAL LOW (ref 135–145)
Sodium: 135 mmol/L (ref 135–145)
Sodium: 133 mmol/L — ABNORMAL LOW (ref 135–145)
Sodium: 136 mmol/L (ref 135–145)
TCO2: 23 mmol/L (ref 22–32)
TCO2: 24 mmol/L (ref 22–32)
TCO2: 23 mmol/L (ref 22–32)
TCO2: 24 mmol/L (ref 22–32)

## 2023-02-24 LAB — GLUCOSE, CAPILLARY
Glucose-Capillary: 115 mg/dL — ABNORMAL HIGH (ref 70–99)
Glucose-Capillary: 119 mg/dL — ABNORMAL HIGH (ref 70–99)
Glucose-Capillary: 119 mg/dL — ABNORMAL HIGH (ref 70–99)
Glucose-Capillary: 119 mg/dL — ABNORMAL HIGH (ref 70–99)
Glucose-Capillary: 122 mg/dL — ABNORMAL HIGH (ref 70–99)
Glucose-Capillary: 128 mg/dL — ABNORMAL HIGH (ref 70–99)
Glucose-Capillary: 96 mg/dL (ref 70–99)

## 2023-02-24 MED ORDER — DIGOXIN 0.25 MG/ML IJ SOLN
0.2500 mg | Freq: Once | INTRAMUSCULAR | Status: AC
  Administered 2023-02-24: 0.25 mg via INTRAVENOUS
  Filled 2023-02-24: qty 2

## 2023-02-24 MED ORDER — AMIODARONE HCL IN DEXTROSE 360-4.14 MG/200ML-% IV SOLN
30.0000 mg/h | INTRAVENOUS | Status: DC
  Administered 2023-02-24 – 2023-02-25 (×3): 30 mg/h via INTRAVENOUS
  Filled 2023-02-24: qty 200

## 2023-02-24 MED ORDER — AMIODARONE HCL IN DEXTROSE 360-4.14 MG/200ML-% IV SOLN
60.0000 mg/h | INTRAVENOUS | Status: DC
  Administered 2023-02-24 (×2): 60 mg/h via INTRAVENOUS
  Filled 2023-02-24 (×3): qty 200

## 2023-02-24 MED ORDER — OXYCODONE HCL 5 MG PO TABS
10.0000 mg | ORAL_TABLET | ORAL | Status: DC | PRN
  Administered 2023-02-24 – 2023-02-25 (×2): 10 mg via ORAL
  Filled 2023-02-24 (×2): qty 2

## 2023-02-24 MED ORDER — AMIODARONE LOAD VIA INFUSION
150.0000 mg | Freq: Once | INTRAVENOUS | Status: AC
  Administered 2023-02-24: 150 mg via INTRAVENOUS
  Filled 2023-02-24: qty 83.34

## 2023-02-24 NOTE — Progress Notes (Signed)
Pt receives out-pt HD at [facility] TTS. Will assist as needed.   Olivia Canter Renal Navigator [phone]

## 2023-02-24 NOTE — Progress Notes (Signed)
NAME:  Phillip Young, MRN:  [id number], DOB:  1986/06/12, LOS: 34 ADMISSION DATE:  01/21/2023, CONSULTATION DATE:  01/22/23 REFERRING MD:  Danise Edge, CHIEF COMPLAINT:  SOB   History of Present Illness:  37 year old man well known to the PCCM and AHF services who presented to [facility] 6/28 with fevers, joint pains, SOB. PMHx significant for ESRD, NICM, refractory AFib, ?amio-induced lung toxicity. Patient reportedly missed HD on Thursday (has a history of leaving HD early). Reportedly ~10kg over dry weight with worsening breathing status, AFib/RVR and borderline BP.  PCCM consulted for ICU admission/further evaluation and management.  Pertinent Medical History:   Past Medical History:  Diagnosis Date   Acute on chronic respiratory failure with hypoxia ([name]) 04/21/2021   Acute on chronic systolic (congestive) heart failure ([name]) 02/26/2020   Amiodarone toxicity    Anemia    Atrial flutter ([name])    Biventricular congestive heart failure ([name])    Chronic hypoxemic respiratory failure ([name])    Class 3 severe obesity due to excess calories with serious comorbidity and body mass index (BMI) of 50.0 to 59.9 in adult [facility]) 02/26/2020   Essential hypertension 02/26/2020   GERD without esophagitis 02/26/2020   Hidradenitis suppurativa 02/26/2020   NICM (nonischemic cardiomyopathy) ([name])    Obesity hypoventilation syndrome ([name])    OSA (obstructive sleep apnea)    PAF (paroxysmal atrial fibrillation) ([name])    Pneumonia    Prediabetes 02/26/2020   Significant Hospital Events: Including procedures, antibiotic start and stop dates in addition to other pertinent events   6/28 Admit, emergent HD 6/29 Afib/RVR, shock, ICU transfer 6/30 Intubated and placed on vasopressor support and started on CRRT 7/1 Remains intubated on CRRT continue with pressor support 7/2 Agitated with SAT, remains on CRRT and low dose levophed 7/3 low dose NE, ongoing CRRT 7/4 Attempted SBT this am, apneic but severely agitated,  desaturated, HR up 130/140s.  Re-sedated but HR remains 140's, NE up from 6 to . NSR on EKG.  Was pulling 50-100on UF, since kept even for now.  CVP 8 7/5 DCCV again attempted this a.m. with success, remains on norepinephrine and vasopressin.  7/6 went into A-fib with RVR, attempted DCCV x 3, started on amiodarone infusion after amiodarone bolus 7/7 Multiple DCCV attempts without success. TF held due to vomiting. Tx denied to [facility]  7/8 KUB looked OK. No further vomiting. Tolerating TF. Large BM overnight.  7/9 vasopressors back up, remains rapid afib/ flutter, Bmx 1, wua causing hemodynamic instability 7/10 weaned sedation on mothers request, HR remains 140-220s, tmax 100.6, DNR, no escalation.  Heart failure signed off, not a candidate for advanced heart failure therapies at this time 7/12 [facility] denied transfer, CODE STATUS changed from DNR back to full code 7/13 Amiodarone restarted with ESR trend  7/16 Amio stopped with no improvement see in rate control  7/20 extubated 7/22 Issues with agitation post extubation, improved with Precedex. Tmax 101, respiratory culture with moderate yeast and fluconazole started 7/23 ID consulted  7/24 ID stopped fluconazole and started Daptomycin for fever of unknown origin  7/25 Intermittent BIPAP need 7/28 Remains on vasopressor support with Levophed and vasopressin. Refractory A-fib with heart rate in 140s to 160s 7/29 CVC dislodged during attempts to ambulate, CRRT stopped and central access utilized for both levo and vaso.  PICC line order placed 7/30 PICC placed overnight, back on CRRT and levophed/.vaso and CRRT 7/31 no changes, Dr. Denese Killings starting Digoxin  Interim History / Subjective:  No overnight events  Remains  on pressors Responding well to Ativan C/o gluteal pain  Objective:  Blood pressure (!) 107/59, pulse (!) 148, temperature 98.9 F (37.2 C), temperature source Oral, resp. rate (!) 23, height 6' (1.829 m), weight (!) 179 kg, SpO2  94%. CVP:  [6 mmHg-22 mmHg] 12 mmHg      Intake/Output Summary (Last 24 hours) at 02/24/2023 1519 Last data filed at 02/24/2023 1500 Gross per 24 hour  Intake 6059.93 ml  Output 7857.5 ml  Net -1797.57 ml   Filed Weights   02/19/23 0600 02/21/23 0600 02/22/23 0600  Weight: (!) 180.2 kg (!) 178.2 kg (!) 179 kg    General:  critically ill obese M, resting in bed in no acute distress HEENT: MM pink/moist, sclera mildly injected  Neuro: awake but somnolent on precedex  CV: s1s2 tachycardic, irregular rate 140-150, no m/r/g PULM:  diminished throughout with shallow inspiration, no rhonchi or wheezing  GI: soft, non-tender  Extremities: warm/dry, no edema  Skin: no rashes or lesions   Labs Hgb 7.9 Creatinine 2.05 Na 130 Glucose 135 Coox 50%   Resolved Hospital Problem List:  Hematuria due to traumatic Foley insertion Pneumococcal bacteremia Fever of unknown origin   Assessment & Plan:     Acute-on-chronic hypoxemic respiratory failure Bilateral multifocal pneumonia OHS/OSA Noncompliant with CPAP at baseline Initially strep and superimposed HCAP -Continue supplemental oxygen with sat goal greater than 90%, remains on HFNC -Head of bed elevated 30 degrees -Follow intermittent chest x-ray and ABG.   -completed Linezolid and Meropenem, Daptomycin -continue to mobilize as able, though efforts limited by deconditioning and habitus, trying tilt bed -Bipap prn     Acute on chronic HFrEF with pulmonary edema Persistent A-fib/flutter with RVR Nonischemic cardiomyopathy EF less than 20% Mixed shock septic/cardiogenic Refractory, with previous amio induced lung and liver toxicity (05/2022). Amio again trailed from 7/13-7/16 with little improvement seen in rate  Many conversations regarding limited therapeutic options and HF team signed off 7/10, mother and patient feel that he can improve and want him transferred to another facility, no accepting facility found at [facility] or  [facility] -coox remains intermittently low, 52% today -we have been unable to wean off high dose Levo and vaso with marked hypotension when pressors decreased, try stopping vaso today -Continue high dose Midodrine  -Continuous telemetry  -continue Eliquis -starting Digoxin, a despite ESRD likely warrants trial to see if HR can be improved, repeat dose today and resume amiodarone per Dr. Denese Killings -HR also improving with benzos  End-stage renal disease on hemodialysis Recurrent metabolic acidosis, improving Metabolic bone disease  Hypercalcemia  -Nephrology following, appreciate assistance -Remains on CRRT since 6/29, has PICC -Follow renal function, no current signs of renal recovery -remains volume overloaded tolerating net neg 50cc/hr    Hypervolemic hyponatremia Hyperphosphatemia Hyperkalemia  Hypocalcemia -trend and replete as needed    Anemia  -Trend CBC  -Transfuse per protocol  -Hgb goal > 7  Skin breakdown -Perineum, pt refusing rectal tube -continue local wound care   Hyperglycemia -continue Semglee and SSI  7/30 Long discussion with mother today, she has many complaints about her son's care here and would like to seek transfer to another facility, preferably [facility] in [place].  Discussed with Dr. Denese Killings who will call and relay family request though has re-iterated that there is likely minimal benefit or change in therapeutic options with transfer.  7/31 Mom updated by Dr. Denese Killings, no transfer available at [facility] (right click and "Reselect all SmartList Selections" daily)  Diet/type: TF DVT prophylaxis: DOAC GI prophylaxis: Famotidine Lines: Yes still needed Foley: n/a Code Status:  Full Last date of multidisciplinary goals of care: 7/30 continue full code and full scope of care   CRITICAL CARE Performed by: Darcella Gasman Almando Brawley  Total critical care time: 32 minutes  Critical care time was exclusive of separately billable procedures  and treating other patients.  Critical care was necessary to treat or prevent imminent or life-threatening deterioration.  Critical care was time spent personally by me on the following activities: development of treatment plan with patient and/or surrogate as well as nursing, discussions with consultants, evaluation of patient's response to treatment, examination of patient, obtaining history from patient or surrogate, ordering and performing treatments and interventions, ordering and review of laboratory studies, ordering and review of radiographic studies, pulse oximetry and re-evaluation of patient's condition.    Darcella Gasman Avraham Benish, PA-C [facility] See Amion for pager If no response to pager , please call [phone] until 7pm After 7:00 pm call Elink  [phone]

## 2023-02-24 NOTE — Plan of Care (Signed)
  Problem: Education: Goal: Knowledge of General Education information will improve Description: Including pain rating scale, medication(s)/side effects and non-pharmacologic comfort measures Outcome: Progressing   Problem: Clinical Measurements: Goal: Cardiovascular complication will be avoided Outcome: Progressing   Problem: Activity: Goal: Risk for activity intolerance will decrease Outcome: Progressing   Problem: Nutrition: Goal: Adequate nutrition will be maintained Outcome: Progressing   

## 2023-02-24 NOTE — Progress Notes (Signed)
[facility] NEPHROLOGY PROGRESS NOTE  Subjective:  Seen and examined in ICU. Remains on high dose vasopressor support. I/Os / 7yesterday. CVP reported 7 this AM.  Pt is awake. C/o mouth dry.    Objective Vital signs in last 24 hours: Vitals:   02/14/23 1600 02/14/23 1615 02/14/23 1630 02/14/23 1645  BP:      Pulse:      Resp: (!) 43 (!) 38 (!) 35 (!) 31  Temp: 98.1 F (36.7 C)     TempSrc: Axillary     SpO2: 100%     Weight:      Height:         Physical Exam: chronically ill looking male, [place] O2 Heart:RRR, s1s2 nl Lungs:coarse breath sound b/l Abdomen: soft, +bs Extremities: 1+ bilat hip edema  Dialysis Access: RIJ TDC intact   OP HD: [facility] TTS  4h   180kg  425/1.5  TDC  Na citrate cath lock (no heparin) - last OP HD was 6/25, post wt 193.8kg - consistently left HD 8-12kg up at least the prior 3 wks - sensipar 60mg  three times per week - hectorol 4 mcg IV three times per week - mircera 200 mcg  IV q 2 wks, last 6/13, due 6/27    Assessment/ Plan:  # Hypotension - remains pressor dependent on NE and vaso; also getting midodrine 30 tid.   # Septic shock/ pneumococcal bacteremia: complex course, ID following, has defervesced. Last blood culture 7/23 negative  # Acute hypoxic and hypercapnic respiratory failure: patient is now extubated. Stable on HFNC  # ESRD - usual HD is TTS. Required emergent HD on 6/29 because of fluid overload.  Then developed shock and afib/ RVR requiring high doses of pressors.  Started CRRT on 6/29. Cont CRRT given pressor dependence at this time.  # Anemia of CKD:  on max ESA with variable Hbs, generally in the 9s.   # MBD ckd - on outpt cinacalcet, holding VDRA in setting of hyper Ca.   # Hypercalcemia: Noted patient has high corrected calcium level in 11s but stable, complication of citrate anticoagulation with normal ionized systemic Ca++.  He is on citrate protocol with calcium gluconate, and we are using 0 Ca fluids  for post HD IVF's and dialysate.   # Volume -remains overloaded but improving - goal net even to net neg 50/hr for now.  Cont CRRT for volume removal at thsi time.   # CRRT anticoagulation - pt required change to citrate protocol for frequent clotting started 7/06.  We continue using citrate protocol due to excessive clotting and intolerance to heparin.   # Acute on chronic systolic CHF, nonischemic: EF 24-40%.  #A-fib with RVR with hypotension: S/P cardioversion 7/5 -> NSR -> afib RVR 7/6 early AM refractory to [facility] x3 + amiodarone. Persistent afib/flutter w/ HR > 100   Estill Bakes MD [facility] Pager [phone]   Recent Labs  Lab 02/23/23 1553 02/23/23 2007 02/24/23 0409 02/24/23 0411 02/24/23 0415 02/24/23 0747  HGB  --    < >  --  8.8* 10.2* 9.5*  ALBUMIN 2.1*  --  2.3*  --   --   --   CALCIUM 9.9  --  10.0  --   --   --   PHOS 4.5  --  4.4  --   --   --   CREATININE 2.17*   < > 2.01* 2.20* 1.80*  --   K 4.4   < >  4.2 4.2 4.0 4.4   < > = values in this interval not displayed.    Inpatient medications:  amiodarone  150 mg Intravenous Once   apixaban  5 mg Per Tube BID   Chlorhexidine Gluconate Cloth  6 each Topical Q0600   darbepoetin (ARANESP) injection - DIALYSIS  200 mcg Subcutaneous Q Sat-1800   digoxin  0.25 mg Intravenous Once   famotidine  20 mg Per Tube Daily   feeding supplement (PROSource TF20)  60 mL Per Tube Q4H   fiber supplement (BANATROL TF)  60 mL Per Tube BID   hydrOXYzine  10 mg Per Tube TID   insulin aspart  0-9 Units Subcutaneous Q4H   insulin aspart  2 Units Subcutaneous Q4H   insulin glargine-yfgn  12 Units Subcutaneous Daily   lidocaine  1 patch Transdermal Q24H   melatonin  5 mg Per Tube QHS   midodrine  30 mg Oral TID WC   multivitamin  1 tablet Per Tube QHS   nutrition supplement (JUVEN)  1 packet Per Tube BID BM   mouth rinse  15 mL Mouth Rinse 4 times per day   QUEtiapine  100 mg Per Tube BID   sodium chloride flush   10-40 mL Intravenous Q12H   valproic acid  500 mg Oral BID    sodium chloride 10 mL/hr at 02/21/23 1444   amiodarone     Followed by   amiodarone     anticoagulant sodium citrate     calcium gluconate 20 g in dextrose 5 % 1,000 mL infusion 50 mL/hr at 02/24/23 0900   citrate dextrose 320 mL/hr at 02/24/23 0420   dexmedetomidine (PRECEDEX) IV infusion 0.1 mcg/kg/hr (02/24/23 0915)   feeding supplement (VITAL 1.5 CAL) 50 mL/hr at 02/24/23 0900   norepinephrine (LEVOPHED) Adult infusion 23 mcg/min (02/24/23 0900)   prismasol B22GK 4/0 1,500 mL/hr at 02/24/23 0735   prismasol B22GK 4/0 600 mL/hr at 02/24/23 0735   vasopressin 0.04 Units/min (02/24/23 0900)   sodium chloride, acetaminophen **OR** acetaminophen, anticoagulant sodium citrate, bisacodyl, docusate, LORazepam, ondansetron (ZOFRAN) IV, mouth rinse, oxyCODONE, polyethylene glycol, polyvinyl alcohol, senna-docusate, sodium chloride flush, surgical lubricant

## 2023-02-24 NOTE — Progress Notes (Signed)
OT Cancellation Note  Patient Details Name: Phillip Young MRN: [id number] DOB: 1985/10/16   Cancelled Treatment:    Reason Eval/Treat Not Completed: Patient at procedure or test/ unavailable. Pt with RN team getting cleaned up from BM/change of bandages etc. Then maximove to [facility] BED. Pt completely tired after care and bed transfer. OT will continue to follow acutely.   Evern Bio Glynis Hunsucker 02/24/2023, 11:37 AM  Nyoka Cowden OTR/L Acute Rehabilitation Services Office: [phone]

## 2023-02-24 NOTE — Progress Notes (Signed)
Speech Language Pathology Treatment: Dysphagia  Patient Details Name: Phillip Young MRN: [id number] DOB: 15-Mar-1986 Today's Date: 02/24/2023 Time: 8469-6295 SLP Time Calculation (min) (ACUTE ONLY): 20 min  Assessment / Plan / Recommendation Clinical Impression  Patient seen by SLP for skilled treatment focused on dysphagia goals. Patient was awake, RN in room.  His RR was in low 30's initially and as high as 40 but for majority of session, it remained in mid to high 20's. Patient was aphonic and could only whisper speak one word per breath, with intelligibility being less than 50% when context not known. RN reported that patient has not exhibited any difficulties with mastication or swallowing, but his appetite is very poor. Patient did tell SLP he was "thirsty" and he was receptive to some small cup sips of nectar thick juice. No overt s/s aspiration observed, but with suspected delayed swallow initiation. After three sips, patient said he was done and wanted HOB to be "flat". SLP reclined HOB to 30 degrees. As compared to previous SLP note from 02/17/23, which stated that his vocal quality was "dysphonic, but he was able to attain minimal phonation", it appears that patient's voice, which was completely aphonic today, has declined. Plan for today's session was also to determine readiness for repeat FEES (Fiberoptic Endoscopic Evaluation of Swallowing), however he does not appear to be ready for that today. SLP will continue to follow.   HPI HPI: Pt is a 37 year old male who presented with fever, joint pains, SOB. ETT 6/30-7/20. PMH: ESRD, GERD, NICM, refractory AFib, ?amio-induced lung toxicity.      SLP Plan  Continue with current plan of care      Recommendations for follow up therapy are one component of a multi-disciplinary discharge planning process, led by the attending physician.  Recommendations may be updated based on patient status, additional functional criteria and insurance  authorization.    Recommendations  Diet recommendations: Dysphagia 2 (fine chop);Nectar-thick liquid Liquids provided via: Cup;Straw Medication Administration: Whole meds with puree Supervision: Trained caregiver to feed patient Compensations: Slow rate;Small sips/bites;Follow solids with liquid Postural Changes and/or Swallow Maneuvers: Seated upright 90 degrees                  Oral care BID;Oral care prior to ice chip/H20   Frequent or constant Supervision/Assistance Dysphagia, oropharyngeal phase (R13.12)     Continue with current plan of care     Angela Nevin, MA, CCC-SLP Speech Therapy

## 2023-02-25 ENCOUNTER — Ambulatory Visit: Payer: BC Managed Care – PPO | Admitting: Internal Medicine

## 2023-02-25 ENCOUNTER — Inpatient Hospital Stay (HOSPITAL_COMMUNITY): Payer: BC Managed Care – PPO

## 2023-02-25 DIAGNOSIS — I4891 Unspecified atrial fibrillation: Secondary | ICD-10-CM | POA: Diagnosis not present

## 2023-02-25 DIAGNOSIS — J9601 Acute respiratory failure with hypoxia: Secondary | ICD-10-CM | POA: Diagnosis not present

## 2023-02-25 DIAGNOSIS — R570 Cardiogenic shock: Secondary | ICD-10-CM | POA: Diagnosis not present

## 2023-02-25 DIAGNOSIS — N186 End stage renal disease: Secondary | ICD-10-CM | POA: Diagnosis not present

## 2023-02-25 LAB — POCT I-STAT, CHEM 8
BUN: 38 mg/dL — ABNORMAL HIGH (ref 6–20)
BUN: 81 mg/dL — ABNORMAL HIGH (ref 6–20)
BUN: 89 mg/dL — ABNORMAL HIGH (ref 6–20)
Calcium, Ion: 0.41 mmol/L — CL (ref 1.15–1.40)
Calcium, Ion: 0.33 mmol/L — CL (ref 1.15–1.40)
Calcium, Ion: 1.01 mmol/L — ABNORMAL LOW (ref 1.15–1.40)
Chloride: 96 mmol/L — ABNORMAL LOW (ref 98–111)
Chloride: 94 mmol/L — ABNORMAL LOW (ref 98–111)
Chloride: 97 mmol/L — ABNORMAL LOW (ref 98–111)
Creatinine, Ser: 1.7 mg/dL — ABNORMAL HIGH (ref 0.61–1.24)
Creatinine, Ser: 2.7 mg/dL — ABNORMAL HIGH (ref 0.61–1.24)
Creatinine, Ser: 3.5 mg/dL — ABNORMAL HIGH (ref 0.61–1.24)
Glucose, Bld: 182 mg/dL — ABNORMAL HIGH (ref 70–99)
Glucose, Bld: 210 mg/dL — ABNORMAL HIGH (ref 70–99)
Glucose, Bld: 177 mg/dL — ABNORMAL HIGH (ref 70–99)
HCT: 34 % — ABNORMAL LOW (ref 39.0–52.0)
HCT: 31 % — ABNORMAL LOW (ref 39.0–52.0)
HCT: 28 % — ABNORMAL LOW (ref 39.0–52.0)
Hemoglobin: 11.6 g/dL — ABNORMAL LOW (ref 13.0–17.0)
Hemoglobin: 10.5 g/dL — ABNORMAL LOW (ref 13.0–17.0)
Hemoglobin: 9.5 g/dL — ABNORMAL LOW (ref 13.0–17.0)
Potassium: 4.5 mmol/L (ref 3.5–5.1)
Potassium: 5.5 mmol/L — ABNORMAL HIGH (ref 3.5–5.1)
Potassium: 5.8 mmol/L — ABNORMAL HIGH (ref 3.5–5.1)
Sodium: 136 mmol/L (ref 135–145)
Sodium: 133 mmol/L — ABNORMAL LOW (ref 135–145)
Sodium: 131 mmol/L — ABNORMAL LOW (ref 135–145)
TCO2: 26 mmol/L (ref 22–32)
TCO2: 23 mmol/L (ref 22–32)
TCO2: 22 mmol/L (ref 22–32)

## 2023-02-25 LAB — GLUCOSE, CAPILLARY
Glucose-Capillary: 105 mg/dL — ABNORMAL HIGH (ref 70–99)
Glucose-Capillary: 110 mg/dL — ABNORMAL HIGH (ref 70–99)
Glucose-Capillary: 116 mg/dL — ABNORMAL HIGH (ref 70–99)
Glucose-Capillary: 125 mg/dL — ABNORMAL HIGH (ref 70–99)
Glucose-Capillary: 144 mg/dL — ABNORMAL HIGH (ref 70–99)
Glucose-Capillary: 153 mg/dL — ABNORMAL HIGH (ref 70–99)
Glucose-Capillary: 94 mg/dL (ref 70–99)
Glucose-Capillary: 99 mg/dL (ref 70–99)

## 2023-02-25 MED ORDER — ALTEPLASE 2 MG IJ SOLR
2.0000 mg | Freq: Once | INTRAMUSCULAR | Status: AC
  Administered 2023-02-25: 1.6 mg
  Filled 2023-02-25: qty 2

## 2023-02-25 MED ORDER — OXYCODONE HCL 5 MG PO TABS
10.0000 mg | ORAL_TABLET | ORAL | Status: DC | PRN
  Administered 2023-02-25: 10 mg via ORAL
  Filled 2023-02-25: qty 2

## 2023-02-25 MED ORDER — SODIUM ZIRCONIUM CYCLOSILICATE 10 G PO PACK
10.0000 g | PACK | Freq: Once | ORAL | Status: AC
  Administered 2023-02-25: 10 g
  Filled 2023-02-25: qty 1

## 2023-02-25 MED ORDER — OXYCODONE HCL 5 MG PO TABS: 5.0000 mg | ORAL_TABLET | ORAL | Status: DC | PRN

## 2023-02-25 NOTE — TOC Progression Note (Signed)
Transition of Care [facility]) - Progression Note    Patient Details  Name: Phillip Young MRN: [id number] Date of Birth: 06-30-1986  Transition of Care [facility]) CM/SW Contact  Elliot Cousin, RN Phone Number: [phone] 02/25/2023, 4:37 PM  Clinical Narrative:  CM able to speak to pt and mother at bedside. Unable to clearly understand patient, able to wave or blink eyes to questions. Mother is hopeful for full recovery. States she is working on disability benefits. States he was approved for Medicare A and B.      Expected Discharge Plan: Home/Self Care Barriers to Discharge: Continued Medical Work up  Expected Discharge Plan and Services   Discharge Planning Services: CM Consult   Living arrangements for the past 2 months: Single Family Home                                       Social Determinants of Health (SDOH) Interventions SDOH Screenings   Food Insecurity: No Food Insecurity (01/22/2023)  Housing: Low Risk  (01/22/2023)  Transportation Needs: No Transportation Needs (01/22/2023)  Utilities: Not At Risk (01/22/2023)  Alcohol Screen: Low Risk  (09/21/2022)  Depression (PHQ2-9): High Risk (09/21/2022)  Financial Resource Strain: High Risk (09/21/2022)  Physical Activity: Inactive (09/21/2022)  Social Connections: Unknown (09/21/2022)  Stress: No Stress Concern Present (09/21/2022)  Tobacco Use: Medium Risk (01/21/2023)    Readmission Risk Interventions    06/09/2022   12:19 PM 05/12/2021   10:35 AM 10/16/2020    2:10 PM  Readmission Risk Prevention Plan  Transportation Screening Complete Complete Complete  PCP or Specialist Appt within 5-7 Days   Complete  Home Care Screening   Complete  Medication Review (RN CM)   Referral to Pharmacy  Medication Review (RN Care Manager) Complete Complete   PCP or Specialist appointment within 3-5 days of discharge Complete Complete   HRI or Home Care Consult Complete Complete   SW Recovery Care/Counseling Consult Complete  Complete   Palliative Care Screening Not Applicable Complete   Skilled Nursing Facility Not Applicable Complete

## 2023-02-25 NOTE — Progress Notes (Signed)
OT Cancellation Note  Patient Details Name: Phillip Young MRN: [id number] DOB: [birth year]   Cancelled Treatment:    Reason Eval/Treat Not Completed: Patient not medically ready;Medical issues which prohibited therapy (Pt requiring high does of vasopressors. Will continue to follow.)  Evern Bio 02/25/2023, 1:00 PM Berna Spare, OTR/L Acute Rehabilitation Services Office: [phone]

## 2023-02-25 NOTE — Progress Notes (Signed)
PT Cancellation Note  Patient Details Name: Phillip Young MRN: [id number] DOB: 11-Feb-1986   Cancelled Treatment:    Reason Eval/Treat Not Completed: Medical issues which prohibited therapy (Pt requiring high does of vasopressors. Will continue to follow.)    Angelina Ok [facility] 02/25/2023, 1:08 PM Skip Mayer PT Acute Rehabilitation Services Office [phone]

## 2023-02-25 NOTE — Progress Notes (Signed)
NAME:  Phillip Young, MRN:  [id number], DOB:  27-Jan-1986, LOS: 35 ADMISSION DATE:  01/21/2023, CONSULTATION DATE:  01/22/23 REFERRING MD:  Danise Edge, CHIEF COMPLAINT:  SOB   History of Present Illness:  37 year old man well known to the PCCM and AHF services who presented to [facility] 6/28 with fevers, joint pains, SOB. PMHx significant for ESRD, NICM, refractory AFib, ?amio-induced lung toxicity. Patient reportedly missed HD on Thursday (has a history of leaving HD early). Reportedly ~10kg over dry weight with worsening breathing status, AFib/RVR and borderline BP.  PCCM consulted for ICU admission/further evaluation and management.  Pertinent Medical History:   Past Medical History:  Diagnosis Date   Acute on chronic respiratory failure with hypoxia ([name]) 04/21/2021   Acute on chronic systolic (congestive) heart failure ([name]) 02/26/2020   Amiodarone toxicity    Anemia    Atrial flutter ([name])    Biventricular congestive heart failure ([name])    Chronic hypoxemic respiratory failure ([name])    Class 3 severe obesity due to excess calories with serious comorbidity and body mass index (BMI) of 50.0 to 59.9 in adult [facility]) 02/26/2020   Essential hypertension 02/26/2020   GERD without esophagitis 02/26/2020   Hidradenitis suppurativa 02/26/2020   NICM (nonischemic cardiomyopathy) ([name])    Obesity hypoventilation syndrome ([name])    OSA (obstructive sleep apnea)    PAF (paroxysmal atrial fibrillation) ([name])    Pneumonia    Prediabetes 02/26/2020   Significant Hospital Events: Including procedures, antibiotic start and stop dates in addition to other pertinent events   6/28 Admit, emergent HD 6/29 Afib/RVR, shock, ICU transfer 6/30 Intubated and placed on vasopressor support and started on CRRT 7/1 Remains intubated on CRRT continue with pressor support 7/2 Agitated with SAT, remains on CRRT and low dose levophed 7/3 low dose NE, ongoing CRRT 7/4 Attempted SBT this am, apneic but severely agitated,  desaturated, HR up 130/140s.  Re-sedated but HR remains 140's, NE up from 6 to . NSR on EKG.  Was pulling 50-100on UF, since kept even for now.  CVP 8 7/5 DCCV again attempted this a.m. with success, remains on norepinephrine and vasopressin.  7/6 went into A-fib with RVR, attempted DCCV x 3, started on amiodarone infusion after amiodarone bolus 7/7 Multiple DCCV attempts without success. TF held due to vomiting. Tx denied to [facility]  7/8 KUB looked OK. No further vomiting. Tolerating TF. Large BM overnight.  7/9 vasopressors back up, remains rapid afib/ flutter, Bmx 1, wua causing hemodynamic instability 7/10 weaned sedation on mothers request, HR remains 140-220s, tmax 100.6, DNR, no escalation.  Heart failure signed off, not a candidate for advanced heart failure therapies at this time 7/12 [facility] denied transfer, CODE STATUS changed from DNR back to full code 7/13 Amiodarone restarted with ESR trend  7/16 Amio stopped with no improvement see in rate control  7/20 extubated 7/22 Issues with agitation post extubation, improved with Precedex. Tmax 101, respiratory culture with moderate yeast and fluconazole started 7/23 ID consulted  7/24 ID stopped fluconazole and started Daptomycin for fever of unknown origin  7/25 Intermittent BIPAP need 7/28 Remains on vasopressor support with Levophed and vasopressin. Refractory A-fib with heart rate in 140s to 160s 7/29 CVC dislodged during attempts to ambulate, CRRT stopped and central access utilized for both levo and vaso.  PICC line order placed 7/30 PICC placed overnight, back on CRRT and levophed/.vaso and CRRT 7/31 no changes, Dr. Denese Killings starting Digoxin  Interim History / Subjective:  Patient is found to be  severely encephalopathic this morning He continued to have increasing requirement of pressors, currently on vasopressin and 40 mics of Levophed  Remain afebrile   Objective:  Blood pressure 104/75, pulse (!) 151, temperature 99.1 F  (37.3 C), temperature source Oral, resp. rate (!) 23, height 6' (1.829 m), weight (!) 182.1 kg, SpO2 91%. CVP:  [4 mmHg-18 mmHg] 14 mmHg  FiO2 (%):  [100 %] 100 %   Intake/Output Summary (Last 24 hours) at 02/25/2023 1337 Last data filed at 02/25/2023 1300 Gross per 24 hour  Intake 3759.43 ml  Output 3788.8 ml  Net -29.37 ml   Filed Weights   02/21/23 0600 02/22/23 0600 02/25/23 0500  Weight: (!) 178.2 kg (!) 179 kg (!) 182.1 kg    Physical exam: General: [name], lying on the bed HEENT: [place]/AT, eyes anicteric.  moist mucus membranes Neuro: Lethargic, opens eyes with vocal stimuli, does not follow commands Chest: Tachypneic, coarse breath sounds, no wheezes or rhonchi Heart: Tachycardic, irregular, no murmurs or gallops Abdomen: Soft, obese, bowel sounds present Skin: No rash  Labs and images were reviewed  Resolved Hospital Problem List:  Hematuria due to traumatic Foley insertion Pneumococcal bacteremia Fever of unknown origin   Assessment & Plan:  Acute-on-chronic hypoxemic respiratory failure Bilateral multifocal pneumonia OHS/OSA Noncompliant with CPAP at baseline Initially strep and superimposed HCAP Continue supplemental oxygen with sat goal greater than 90%, remains on HFNC Completed antibiotic therapy x 2 Has been off antibiotics for last 3 days, remained afebrile  Acute on chronic HFrEF with pulmonary edema Persistent A-fib/flutter with RVR Nonischemic cardiomyopathy EF less than 20% Mixed shock septic/cardiogenic Refractory, with previous amio induced lung and liver toxicity (05/2022). Amio again trailed from 7/13-7/16 with no improvement seen in rate  Many conversations regarding limited therapeutic options and HF team signed off 7/10, mother and patient feel that he can improve and want him transferred to another facility, no accepting facility found at [facility] or [facility] remain low but stable Remain on Levophed and  vasopressin with MAP goal 65 On high-dose midodrine Continue Eliquis Stop digoxin and amiodarone as none of the medication is helping to control his rate He is refractory even to electrical cardioversion  End-stage renal disease on hemodialysis Recurrent metabolic acidosis, improving Metabolic bone disease  Hypercalcemia  Nephrology following, appreciate assistance Holding off on CRRT for now due to increasing vasopressor requirement  Hypervolemic hyponatremia Hyperphosphatemia Hyperkalemia  Hypocalcemia Closely monitor electrolytes  Anemia of chronic disease Monitor H&H and transfuse if less than 7  Skin breakdown, not POA Perineum, pt refusing rectal tube Continue local wound care   Best Practice (right click and "Reselect all SmartList Selections" daily)   Diet/type: TF DVT prophylaxis: DOAC GI prophylaxis: Famotidine Lines: Yes still needed Foley: n/a Code Status:  Full Last date of multidisciplinary goals of care: 7/30 continue full code and full scope of care     The patient is critically ill due to cardiogenic shock/persistent A-fib critical care was necessary to treat or prevent imminent or life-threatening deterioration.  Critical care was time spent personally by me on the following activities: development of treatment plan with patient and/or surrogate as well as nursing, discussions with consultants, evaluation of patient's response to treatment, examination of patient, obtaining history from patient or surrogate, ordering and performing treatments and interventions, ordering and review of laboratory studies, ordering and review of radiographic studies, pulse oximetry, re-evaluation of patient's condition and participation in multidisciplinary rounds.   During this encounter critical  care time was devoted to patient care services described in this note for 38 minutes.     Cheri Fowler, MD [facility] See Amion for pager If no response to  pager, please call [phone] until 7pm After 7pm, Please call E-link [phone]

## 2023-02-25 NOTE — Progress Notes (Addendum)
[facility] NEPHROLOGY PROGRESS NOTE  Subjective:  Seen and examined in ICU. Remains on high dose vasopressor support. I/Os 4.2 / 5.9 yesterday. Issues with HD tunneled cath - s/p tPA x 2 and venous not functional at all.    Objective Vital signs in last 24 hours: Vitals:   02/14/23 1600 02/14/23 1615 02/14/23 1630 02/14/23 1645  BP:      Pulse:      Resp: (!) 43 (!) 38 (!) 35 (!) 31  Temp: 98.1 F (36.7 C)     TempSrc: Axillary     SpO2: 100%     Weight:      Height:         Physical Exam: chronically ill looking male, [place] O2 Heart:RRR, s1s2 nl Lungs:coarse breath sound b/l Abdomen: soft, +bs Extremities: 1+ bilat hip edema  Dialysis Access: RIJ TDC intact   OP HD: [facility] TTS  4h   180kg  425/1.5  TDC  Na citrate cath lock (no heparin) - last OP HD was 6/25, post wt 193.8kg - consistently left HD 8-12kg up at least the prior 3 wks - sensipar 60mg  three times per week - hectorol 4 mcg IV three times per week - mircera 200 mcg  IV q 2 wks, last 6/13, due 6/27    Assessment/ Plan:  # Hypotension - remains pressor dependent on NE; also getting midodrine 30 tid.   # Septic shock/ pneumococcal bacteremia: complex course, ID following, has defervesced. Last blood culture 7/23 negative.  Off abx now.   # Acute hypoxic and hypercapnic respiratory failure: patient is now extubated. Stable on HFNC  # ESRD - usual HD is TTS. Required emergent HD on 6/29 because of fluid overload.  Then developed shock and afib/ RVR requiring high doses of pressors.  Started CRRT on 6/29. Cont CRRT given pressor dependence at this time. HD catheter not functional and will need to be replaced - cannot tolerate long periods off CRRT; IR consulted for exchange.    # Anemia of CKD:  on max ESA with variable Hbs, generally in the 9s.   # MBD ckd - on outpt cinacalcet, holding VDRA in setting of hyper Ca.   # Hypercalcemia: Noted patient has high corrected calcium level in 11s but  stable, complication of citrate anticoagulation with normal ionized systemic Ca++.  He is on citrate protocol with calcium gluconate, and we are using 0 Ca fluids for post HD IVF's and dialysate.   # Volume -remains overloaded but improving - goal net even to net neg 50/hr for now.  Cont CRRT for volume removal at thsi time.   # CRRT anticoagulation - pt required change to citrate protocol for frequent clotting started 7/06.  We continue using citrate protocol due to excessive clotting and intolerance to heparin.   # Acute on chronic systolic CHF, nonischemic: EF 40-98%.  #A-fib with RVR with hypotension: S/P cardioversion 7/5 -> NSR -> afib RVR 7/6 early AM refractory to [facility] x3 + amiodarone. Persistent afib/flutter w/ HR > 100  ADDENDUM:  contacted by RN that patient with increasing hemodynamic instability through the day.  Repeat labs sent showing K 6 now.  Treating with lokelma immediately and PCCM to insert a temp line bedside as deemed too unstable to be taken to IR for [facility] exchange.  Plan resume CRRT thereafter.    Estill Bakes MD [facility] Pager [phone]   Recent Labs  Lab 02/24/23 1557 02/25/23 0140 02/25/23 0500  HGB  --  11.6* 8.5*  ALBUMIN 2.3*  --  2.0*  CALCIUM 10.2  --  9.5  PHOS 4.6  --  4.2  CREATININE 2.02* 1.70* 2.05*  K 4.3 4.5 4.4    Inpatient medications:  apixaban  5 mg Per Tube BID   Chlorhexidine Gluconate Cloth  6 each Topical Q0600   darbepoetin (ARANESP) injection - DIALYSIS  200 mcg Subcutaneous Q Sat-1800   famotidine  20 mg Per Tube Daily   feeding supplement (PROSource TF20)  60 mL Per Tube Q4H   fiber supplement (BANATROL TF)  60 mL Per Tube BID   hydrOXYzine  10 mg Per Tube TID   insulin aspart  0-9 Units Subcutaneous Q4H   insulin aspart  2 Units Subcutaneous Q4H   insulin glargine-yfgn  12 Units Subcutaneous Daily   lidocaine  1 patch Transdermal Q24H   melatonin  5 mg Per Tube QHS   midodrine  30 mg Oral TID WC    multivitamin  1 tablet Per Tube QHS   nutrition supplement (JUVEN)  1 packet Per Tube BID BM   mouth rinse  15 mL Mouth Rinse 4 times per day   QUEtiapine  100 mg Per Tube BID   sodium chloride flush  10-40 mL Intravenous Q12H   valproic acid  500 mg Oral BID    sodium chloride 10 mL/hr at 02/21/23 1444   amiodarone 30 mg/hr (02/25/23 0839)   anticoagulant sodium citrate     calcium gluconate 20 g in dextrose 5 % 1,000 mL infusion Stopped (02/25/23 0330)   citrate dextrose 320 mL/hr at 02/24/23 0420   dexmedetomidine (PRECEDEX) IV infusion 1 mcg/kg/hr (02/25/23 0835)   feeding supplement (VITAL 1.5 CAL) 50 mL/hr at 02/25/23 0600   norepinephrine (LEVOPHED) Adult infusion 20 mcg/min (02/25/23 0832)   prismasol B22GK 4/0 1,500 mL/hr at 02/25/23 0840   prismasol B22GK 4/0 600 mL/hr at 02/25/23 0841   vasopressin Stopped (02/24/23 1336)   sodium chloride, acetaminophen **OR** acetaminophen, anticoagulant sodium citrate, bisacodyl, docusate, LORazepam, ondansetron (ZOFRAN) IV, mouth rinse, oxyCODONE, polyethylene glycol, polyvinyl alcohol, senna-docusate, sodium chloride flush, surgical lubricant

## 2023-02-25 NOTE — Progress Notes (Signed)
SLP Cancellation Note  Patient Details Name: Phillip Young MRN: [id number] DOB: 02/07/1986   Cancelled treatment:        Attempted to see pt for ongoing dysphagia management.  Spoke briefly with RN.  Pt is not medically ready for POs at this time.  SLP will continue to follow for diet tolerance and possible advancement and/or repeat instrumental.    Kerrie Pleasure, MA, CCC-SLP Acute Rehabilitation Services Office: [phone] 02/25/2023, 11:09 AM

## 2023-02-25 NOTE — Progress Notes (Signed)
IV team arrived to room to remove TPA from HD cath blue lumen but plan to return when sodium citrate is received from pharmacy, waiting to hear from primary RN.

## 2023-02-25 NOTE — Progress Notes (Signed)
eLink Physician-Brief Progress Note Patient Name: Phillip Young DOB: 01/28/1986 MRN: [id number]   Date of Service  02/25/2023  HPI/Events of Note  Patient having pain from wound on his bottom Previously had PRN oxy that was discontinued per mother's request > now requesting for it to be restarted  eICU Interventions  PRN oxycodone 10 mg ordered     Intervention Category Minor Interventions: Routine modifications to care plan (e.g. PRN medications for pain, fever)   Mechele Collin 02/25/2023, 10:05 PM

## 2023-02-25 NOTE — Progress Notes (Signed)
At approximately 0000 August 2nd, the pts CRRT machine alarmed that the filter had reached its maximum usage and needed to be changed. Dialysis was stopped and the blood was returned. The new set was primed and started. After the new filter was started we started getting extremely negative' access pressure alarms. The lines were aspirated and flushed, lines were switched, problem was not solved. A new set was attempted due to the fact that the previous set had made it the entirety of its lifespan, but the new set had the same issue. At 0350 IV team came by and started an Alteplase dwell for two hours. After the 2 hours the blue port was still not pulling back so the decision was made to dwell for another hour.

## 2023-02-25 NOTE — Procedures (Signed)
Central Venous Catheter Insertion Procedure Note  Dustine Stickler  [id number]  1985-11-17  Date:02/25/23  Time:5:42 PM   Provider Performing:    Procedure: Insertion of Non-tunneled Central Venous Catheter(36556)with US guidance (38756)    Indication(s) Hemodialysis  Consent Risks of the procedure as well as the alternatives and risks of each were explained to the patient and/or caregiver.  Consent for the procedure was obtained and is signed in the bedside chart  Anesthesia Topical only with 1% lidocaine   Timeout Verified patient identification, verified procedure, site/side was marked, verified correct patient position, special equipment/implants available, medications/allergies/relevant history reviewed, required imaging and test results available.  Sterile Technique Maximal sterile technique including full sterile barrier drape, hand hygiene, sterile gown, sterile gloves, mask, hair covering, sterile ultrasound probe cover (if used).  Procedure Description Area of catheter insertion was cleaned with chlorhexidine and draped in sterile fashion.   With real-time ultrasound guidance a HD catheter was placed into the left internal jugular vein.  Nonpulsatile blood flow and easy flushing noted in all ports.  The catheter was sutured in place and sterile dressing applied.  Complications/Tolerance None; patient tolerated the procedure well. Chest X-ray is ordered to verify placement for internal jugular or subclavian cannulation.  Chest x-ray is not ordered for femoral cannulation.  EBL Minimal  Specimen(s) None

## 2023-02-25 NOTE — Progress Notes (Deleted)
HPI M former smoker, bus driver, followed for OSA, complicated by CHF, AFlutter, Chronic Hypoxic Respiratory Failure, GERD, Hidradenitis Suppurativa, Osteomyelitis, ESRD/Dialysis, Obesity,  NPSG 09/10/21 AHI 110.4/ hr, desaturation to 71%, Mean 88%,  Trilogy NIV  VTE 437 ml,, Avg IPAP 21.3, Avg EPAP 11.2/ [employer]    Download compliance - Used 83% of days in past month, average 7.4 hrs/ night  O2 1-2L/sleep [employer]   Walk Test 05/18/22 O2 qualifying-started at 97% RA/ 129 HR. After 1 lap he dropped to 86%/ 132. Remained 86% walking on 3L, required 5L continuous O2 walking for Sat 96%, HR 130.  =============================================================    05/18/22- 36 yoM former smoker, bus driver, followed for OSA, complicated by CHF, AFlutter, Chronic Hypoxic Respiratory Failure, GERD, Hidradenitis Suppurativa, Osteomyelitis, ESRD/Dialysis, Obesity,  Trilogy NIV  VTE 437 ml,, Avg IPAP 21.3, Avg EPAP 11.2/ [employer]    Download compliance - Used 83% of days in past month, average 7.4 hrs/ night  O2 1-2L/sleep [employer]      Body weight today-401 lbs Covid vax-none Flu vax- declined Hosp 9/1-9/3  CHF, Viral illness, MSSA sepsis/ osteomyelitis Arrival HR 128, O2 saturation 88%, recovering with rest to 94%, quite dyspneic walking from exam room. He is aware his heart is out of rhythm again, pending cardiology visit in 2 days.  He anticipates that he may recommend cardioversion again. I suggested we do an oxygen qualifying walk test while he is here so that we have the data, even though an order for portable oxygen might depend on what Dr. Elberta Fortis decides to do. Walk Test 05/18/22 O2 qualifying-started at 97% RA/ 129 HR. After 1 lap he dropped to 86%/ 132. Remained 86% walking on 3L, required 5L continuous O2 walking for Sat 96%, HR 130. CXR 03/26/22 IMPRESSION: 1. Cardiomegaly with pulmonary vascular congestion. 2. Mild airspace disease at the lung bases, possible edema or infiltrate.  02/25/23- 36  yoM former smoker, bus driver, followed for OSA, complicated by CHF, AFlutter, Chronic Hypoxic Respiratory Failure, GERD, Hidradenitis Suppurativa, Osteomyelitis, ESRD/Dialysis, Obesity,  Trilogy NIV  VTE 437 ml,, Avg IPAP 21.3, Avg EPAP 11.2/ [employer]    Download compliance - Used 83% of days in past month, average 7.4 hrs/ night  O2 1-2L/sleep [employer]      Body weight today-    ROS-see HPI   + = positive Constitutional:    weight loss, night sweats, fevers, chills, fatigue, lassitude. HEENT:    headaches, difficulty swallowing, tooth/dental problems, sore throat,       sneezing, itching, ear ache, nasal congestion, post nasal drip, snoring CV:    chest pain, orthopnea, PND, swelling in lower extremities, anasarca,         dizziness, palpitations Resp:  + shortness of breath with exertion or at rest.                +productive cough,   non-productive cough, coughing up of blood.              change in color of mucus.  wheezing.   Skin:    rash or lesions. GI:  No-   heartburn, indigestion, abdominal pain, nausea, vomiting, diarrhea,                 change in bowel habits, loss of appetite GU: dysuria, change in color of urine, no urgency or frequency.   flank pain. MS:   joint pain, stiffness, decreased range of motion, back pain. Neuro-     nothing unusual Psych:  change  in mood or affect.  depression or anxiety.   memory loss.  OBJ- Physical Exam General- Alert, Oriented, Affect-appropriate, Distress- none acute, +morbidly obese Skin- rash-none, lesions- none, excoriation- none Lymphadenopathy- none Head- atraumatic            Eyes- Gross vision intact, PERRLA, conjunctivae and secretions clear            Ears- Hearing, canals-normal            Nose- Clear, no-Septal dev, mucus, polyps, erosion, perforation             Throat- Mallampati III-IV, mucosa clear , drainage- none, tonsils- atrophic Neck- flexible , trachea midline, no stridor , thyroid nl, carotid no bruit Chest -  symmetrical excursion , unlabored           Heart/CV- +IRR/ barely audible , no murmur , no gallop  , no rub, nl s1 s2                           - JVD- none , edema- none, stasis changes- none, varices- none           Lung- clear to P&A, wheeze- none, cough- none , dullness-none, rub- none           Chest wall- +HD access R upper chest Abd-  Br/ Gen/ Rectal- Not done, not indicated Extrem- cyanosis- none, clubbing, none, atrophy- none, strength- nl Neuro- grossly intact to observation

## 2023-02-26 DIAGNOSIS — I4891 Unspecified atrial fibrillation: Secondary | ICD-10-CM | POA: Diagnosis not present

## 2023-02-26 DIAGNOSIS — J9601 Acute respiratory failure with hypoxia: Secondary | ICD-10-CM | POA: Diagnosis not present

## 2023-02-26 DIAGNOSIS — R570 Cardiogenic shock: Secondary | ICD-10-CM | POA: Diagnosis not present

## 2023-02-26 DIAGNOSIS — I5022 Chronic systolic (congestive) heart failure: Secondary | ICD-10-CM | POA: Diagnosis not present

## 2023-02-26 LAB — POCT I-STAT, CHEM 8
BUN: 76 mg/dL — ABNORMAL HIGH (ref 6–20)
BUN: 84 mg/dL — ABNORMAL HIGH (ref 6–20)
BUN: 66 mg/dL — ABNORMAL HIGH (ref 6–20)
BUN: 78 mg/dL — ABNORMAL HIGH (ref 6–20)
BUN: 74 mg/dL — ABNORMAL HIGH (ref 6–20)
Calcium, Ion: 0.33 mmol/L — CL (ref 1.15–1.40)
Calcium, Ion: 1.03 mmol/L — ABNORMAL LOW (ref 1.15–1.40)
Calcium, Ion: 0.31 mmol/L — CL (ref 1.15–1.40)
Calcium, Ion: 1 mmol/L — ABNORMAL LOW (ref 1.15–1.40)
Calcium, Ion: 1.01 mmol/L — ABNORMAL LOW (ref 1.15–1.40)
Chloride: 94 mmol/L — ABNORMAL LOW (ref 98–111)
Chloride: 95 mmol/L — ABNORMAL LOW (ref 98–111)
Chloride: 94 mmol/L — ABNORMAL LOW (ref 98–111)
Chloride: 95 mmol/L — ABNORMAL LOW (ref 98–111)
Chloride: 95 mmol/L — ABNORMAL LOW (ref 98–111)
Creatinine, Ser: 2.3 mg/dL — ABNORMAL HIGH (ref 0.61–1.24)
Creatinine, Ser: 2.9 mg/dL — ABNORMAL HIGH (ref 0.61–1.24)
Creatinine, Ser: 2.1 mg/dL — ABNORMAL HIGH (ref 0.61–1.24)
Creatinine, Ser: 2.7 mg/dL — ABNORMAL HIGH (ref 0.61–1.24)
Creatinine, Ser: 2.5 mg/dL — ABNORMAL HIGH (ref 0.61–1.24)
Glucose, Bld: 211 mg/dL — ABNORMAL HIGH (ref 70–99)
Glucose, Bld: 241 mg/dL — ABNORMAL HIGH (ref 70–99)
Glucose, Bld: 205 mg/dL — ABNORMAL HIGH (ref 70–99)
Glucose, Bld: 236 mg/dL — ABNORMAL HIGH (ref 70–99)
Glucose, Bld: 183 mg/dL — ABNORMAL HIGH (ref 70–99)
HCT: 28 % — ABNORMAL LOW (ref 39.0–52.0)
HCT: 30 % — ABNORMAL LOW (ref 39.0–52.0)
HCT: 25 % — ABNORMAL LOW (ref 39.0–52.0)
HCT: 28 % — ABNORMAL LOW (ref 39.0–52.0)
HCT: 27 % — ABNORMAL LOW (ref 39.0–52.0)
Hemoglobin: 10.2 g/dL — ABNORMAL LOW (ref 13.0–17.0)
Hemoglobin: 9.5 g/dL — ABNORMAL LOW (ref 13.0–17.0)
Hemoglobin: 8.5 g/dL — ABNORMAL LOW (ref 13.0–17.0)
Hemoglobin: 9.5 g/dL — ABNORMAL LOW (ref 13.0–17.0)
Hemoglobin: 9.2 g/dL — ABNORMAL LOW (ref 13.0–17.0)
Potassium: 5.1 mmol/L (ref 3.5–5.1)
Potassium: 5.5 mmol/L — ABNORMAL HIGH (ref 3.5–5.1)
Potassium: 5.2 mmol/L — ABNORMAL HIGH (ref 3.5–5.1)
Potassium: 5.7 mmol/L — ABNORMAL HIGH (ref 3.5–5.1)
Potassium: 5.5 mmol/L — ABNORMAL HIGH (ref 3.5–5.1)
Sodium: 132 mmol/L — ABNORMAL LOW (ref 135–145)
Sodium: 134 mmol/L — ABNORMAL LOW (ref 135–145)
Sodium: 131 mmol/L — ABNORMAL LOW (ref 135–145)
Sodium: 134 mmol/L — ABNORMAL LOW (ref 135–145)
Sodium: 131 mmol/L — ABNORMAL LOW (ref 135–145)
TCO2: 27 mmol/L (ref 22–32)
TCO2: 29 mmol/L (ref 22–32)
TCO2: 26 mmol/L (ref 22–32)
TCO2: 28 mmol/L (ref 22–32)
TCO2: 31 mmol/L (ref 22–32)

## 2023-02-26 LAB — POCT I-STAT 7, (LYTES, BLD GAS, ICA,H+H)
Acid-Base Excess: 0 mmol/L (ref 0.0–2.0)
Bicarbonate: 30.7 mmol/L — ABNORMAL HIGH (ref 20.0–28.0)
Calcium, Ion: 1 mmol/L — ABNORMAL LOW (ref 1.15–1.40)
HCT: 26 % — ABNORMAL LOW (ref 39.0–52.0)
Hemoglobin: 8.8 g/dL — ABNORMAL LOW (ref 13.0–17.0)
O2 Saturation: 97 %
Potassium: 5.9 mmol/L — ABNORMAL HIGH (ref 3.5–5.1)
Sodium: 131 mmol/L — ABNORMAL LOW (ref 135–145)
TCO2: 34 mmol/L — ABNORMAL HIGH (ref 22–32)
pCO2 arterial: 101.3 mmHg (ref 32–48)
pH, Arterial: 7.089 — CL (ref 7.35–7.45)
pO2, Arterial: 125 mmHg — ABNORMAL HIGH (ref 83–108)

## 2023-02-26 LAB — RENAL FUNCTION PANEL
Albumin: 2.3 g/dL — ABNORMAL LOW (ref 3.5–5.0)
Anion gap: 17 — ABNORMAL HIGH (ref 5–15)
BUN: 58 mg/dL — ABNORMAL HIGH (ref 6–20)
CO2: 27 mmol/L (ref 22–32)
Calcium: 8.7 mg/dL — ABNORMAL LOW (ref 8.9–10.3)
Chloride: 91 mmol/L — ABNORMAL LOW (ref 98–111)
Creatinine, Ser: 2.54 mg/dL — ABNORMAL HIGH (ref 0.61–1.24)
GFR, Estimated: 32 mL/min — ABNORMAL LOW (ref >60)
Glucose, Bld: 178 mg/dL — ABNORMAL HIGH (ref 70–99)
Phosphorus: 5.6 mg/dL — ABNORMAL HIGH (ref 2.5–4.6)
Potassium: 5.6 mmol/L — ABNORMAL HIGH (ref 3.5–5.1)
Sodium: 135 mmol/L (ref 135–145)

## 2023-02-26 LAB — GLUCOSE, CAPILLARY
Glucose-Capillary: 186 mg/dL — ABNORMAL HIGH (ref 70–99)
Glucose-Capillary: 150 mg/dL — ABNORMAL HIGH (ref 70–99)
Glucose-Capillary: 169 mg/dL — ABNORMAL HIGH (ref 70–99)
Glucose-Capillary: 172 mg/dL — ABNORMAL HIGH (ref 70–99)
Glucose-Capillary: 153 mg/dL — ABNORMAL HIGH (ref 70–99)
Glucose-Capillary: 170 mg/dL — ABNORMAL HIGH (ref 70–99)

## 2023-02-26 MED ORDER — INSULIN ASPART 100 UNIT/ML IJ SOLN
4.0000 [IU] | INTRAMUSCULAR | Status: DC
  Administered 2023-02-26 – 2023-02-27 (×5): 4 [IU] via SUBCUTANEOUS

## 2023-02-26 MED ORDER — OXYCODONE HCL 5 MG PO TABS
5.0000 mg | ORAL_TABLET | ORAL | Status: DC | PRN
  Administered 2023-02-26: 5 mg via ORAL
  Administered 2023-02-27 (×2): 10 mg via ORAL
  Administered 2023-02-28: 5 mg via ORAL
  Administered 2023-02-28 (×3): 10 mg via ORAL
  Administered 2023-02-28: 5 mg via ORAL
  Administered 2023-02-28 – 2023-03-03 (×12): 10 mg via ORAL
  Filled 2023-02-26 (×3): qty 2
  Filled 2023-02-26: qty 1
  Filled 2023-02-26 (×15): qty 2

## 2023-02-26 MED ORDER — SODIUM ZIRCONIUM CYCLOSILICATE 10 G PO PACK
10.0000 g | PACK | Freq: Two times a day (BID) | ORAL | Status: DC
  Administered 2023-02-26 – 2023-02-27 (×3): 10 g via ORAL
  Filled 2023-02-26 (×3): qty 1

## 2023-02-26 NOTE — Progress Notes (Signed)
NAME:  Phillip Young, MRN:  [id number], DOB:  16-May-1986, LOS: 36 ADMISSION DATE:  01/21/2023, CONSULTATION DATE:  01/22/23 REFERRING MD:  Danise Edge, CHIEF COMPLAINT:  SOB   History of Present Illness:  37 year old man well known to the PCCM and AHF services who presented to [facility] 6/28 with fevers, joint pains, SOB. PMHx significant for ESRD, NICM, refractory AFib, ?amio-induced lung toxicity. Patient reportedly missed HD on Thursday (has a history of leaving HD early). Reportedly ~10kg over dry weight with worsening breathing status, AFib/RVR and borderline BP.  PCCM consulted for ICU admission/further evaluation and management.  Pertinent Medical History:   Past Medical History:  Diagnosis Date   Acute on chronic respiratory failure with hypoxia ([name]) 04/21/2021   Acute on chronic systolic (congestive) heart failure ([name]) 02/26/2020   Amiodarone toxicity    Anemia    Atrial flutter ([name])    Biventricular congestive heart failure ([name])    Chronic hypoxemic respiratory failure ([name])    Class 3 severe obesity due to excess calories with serious comorbidity and body mass index (BMI) of 50.0 to 59.9 in adult [facility]) 02/26/2020   Essential hypertension 02/26/2020   GERD without esophagitis 02/26/2020   Hidradenitis suppurativa 02/26/2020   NICM (nonischemic cardiomyopathy) ([name])    Obesity hypoventilation syndrome ([name])    OSA (obstructive sleep apnea)    PAF (paroxysmal atrial fibrillation) ([name])    Pneumonia    Prediabetes 02/26/2020   Significant Hospital Events: Including procedures, antibiotic start and stop dates in addition to other pertinent events   6/28 Admit, emergent HD 6/29 Afib/RVR, shock, ICU transfer 6/30 Intubated and placed on vasopressor support and started on CRRT 7/1 Remains intubated on CRRT continue with pressor support 7/2 Agitated with SAT, remains on CRRT and low dose levophed 7/3 low dose NE, ongoing CRRT 7/4 Attempted SBT this am, apneic but severely agitated,  desaturated, HR up 130/140s.  Re-sedated but HR remains 140's, NE up from 6 to . NSR on EKG.  Was pulling 50-100on UF, since kept even for now.  CVP 8 7/5 DCCV again attempted this a.m. with success, remains on norepinephrine and vasopressin.  7/6 went into A-fib with RVR, attempted DCCV x 3, started on amiodarone infusion after amiodarone bolus 7/7 Multiple DCCV attempts without success. TF held due to vomiting. Tx denied to [facility]  7/8 KUB looked OK. No further vomiting. Tolerating TF. Large BM overnight.  7/9 vasopressors back up, remains rapid afib/ flutter, Bmx 1, wua causing hemodynamic instability 7/10 weaned sedation on mothers request, HR remains 140-220s, tmax 100.6, DNR, no escalation.  Heart failure signed off, not a candidate for advanced heart failure therapies at this time 7/12 [facility] denied transfer, CODE STATUS changed from DNR back to full code 7/13 Amiodarone restarted with ESR trend  7/16 Amio stopped with no improvement see in rate control  7/20 extubated 7/22 Issues with agitation post extubation, improved with Precedex. Tmax 101, respiratory culture with moderate yeast and fluconazole started 7/23 ID consulted  7/24 ID stopped fluconazole and started Daptomycin for fever of unknown origin  7/25 Intermittent BIPAP need 7/28 Remains on vasopressor support with Levophed and vasopressin. Refractory A-fib with heart rate in 140s to 160s 7/29 CVC dislodged during attempts to ambulate, CRRT stopped and central access utilized for both levo and vaso.  PICC line order placed 7/30 PICC placed overnight, back on CRRT and levophed/.vaso and CRRT 7/31 no changes, Dr. Denese Killings starting Digoxin  Interim History / Subjective:  CRRT was reinitiated yesterday evening  He converted to sinus rhythm Continue to require vasopressor support with Levophed and vasopressin   Objective:  Blood pressure 97/61, pulse (!) 116, temperature 98.6 F (37 C), temperature source Oral, resp. rate (!)  22, height 6' (1.829 m), weight (!) 181.7 kg, SpO2 94%. CVP:  [8 mmHg-43 mmHg] 18 mmHg  FiO2 (%):  [80 %-100 %] 80 %   Intake/Output Summary (Last 24 hours) at 02/26/2023 0940 Last data filed at 02/26/2023 0900 Gross per 24 hour  Intake 3059.06 ml  Output 1107.9 ml  Net 1951.16 ml   Filed Weights   02/22/23 0600 02/25/23 0500 02/26/23 0500  Weight: (!) 179 kg (!) 182.1 kg (!) 181.7 kg    Physical exam: General: [name], lying on the bed HEENT: [place]/AT, eyes anicteric.  moist mucus membranes Neuro: Awake, following commands, moving all 4 extremities Chest: Tachypneic, coarse breath sounds, no wheezes or rhonchi Heart: Tachycardic, regular rhythm, no murmurs or gallops Abdomen: Soft, nontender, nondistended, bowel sounds present Skin: No rash   Labs and images were reviewed  Resolved Hospital Problem List:  Hematuria due to traumatic Foley insertion Pneumococcal bacteremia Fever of unknown origin  Bilateral multifocal pneumonia  Assessment & Plan:  Acute-on-chronic hypoxemic respiratory failure OHS/OSA Noncompliant with CPAP at baseline Initially strep and superimposed HCAP Continue supplemental oxygen with sat goal greater than 90%, remains on HFNC Completed antibiotic therapy x 2 Remain afebrile Has been off antibiotics for few days now  Acute on chronic HFrEF with pulmonary edema Persistent A-fib/flutter with RVR Nonischemic cardiomyopathy EF less than 20% Mixed shock septic/cardiogenic Refractory, with previous amio induced lung and liver toxicity (05/2022). Amio again trailed from 7/13-7/16 with no improvement seen in rate  He spontaneously converted to sinus rhythm overnight Many conversations regarding limited therapeutic options and HF team signed off 7/10, mother and patient feel that he can improve and want him transferred to another facility, no accepting facility found at [facility] or [facility] remain low but stable Remain on  Levophed and vasopressin with MAP goal 65 On high-dose midodrine Continue Eliquis Digoxin and amiodarone was stopped yesterday Septic component of shock has resolved, continue to remain in cardiogenic shock  End-stage renal disease on hemodialysis Recurrent metabolic acidosis, improving Metabolic bone disease  Hypercalcemia  Nephrology following, appreciate assistance CRRT was restarted last evening  Hypervolemic hyponatremia Hyperphosphatemia Hyperkalemia  Hypocalcemia Closely monitor electrolytes  Anemia of chronic disease Monitor H&H and transfuse if less than 7  Skin breakdown, not POA Perineum, pt refusing rectal tube Continue local wound care   Best Practice (right click and "Reselect all SmartList Selections" daily)   Diet/type: TF DVT prophylaxis: DOAC GI prophylaxis: Famotidine Lines: Yes still needed Foley: n/a Code Status:  Full Last date of multidisciplinary goals of care: 7/30 continue full code and full scope of care     The patient is critically ill due to cardiogenic shock/persistent A-fib critical care was necessary to treat or prevent imminent or life-threatening deterioration.  Critical care was time spent personally by me on the following activities: development of treatment plan with patient and/or surrogate as well as nursing, discussions with consultants, evaluation of patient's response to treatment, examination of patient, obtaining history from patient or surrogate, ordering and performing treatments and interventions, ordering and review of laboratory studies, ordering and review of radiographic studies, pulse oximetry, re-evaluation of patient's condition and participation in multidisciplinary rounds.   During this encounter critical care time was devoted to patient care services described in this  note for 33 minutes.     Cheri Fowler, MD [facility] See Amion for pager If no response to pager, please call [phone] until  7pm After 7pm, Please call E-link [phone]

## 2023-02-26 NOTE — Progress Notes (Signed)
Dr. Merrily Pew notified concerning patient's deep sedation and low BP. Seroquel d/c'ed and MAP goal decreased to >55. Will continue to care for patient per provider's orders

## 2023-02-26 NOTE — Progress Notes (Addendum)
eLink Physician-Brief Progress Note Patient Name: Phillip Young DOB: [birth year] MRN: [id number]   Date of Service  02/26/2023  HPI/Events of Note  BSRN reports patient difficult to arouse On camera check morbidly obese male on HHFNC, drowsy, on CRRT, levophed 40 and vasopressin  eICU Interventions  ABG ordered> 7.089/101/125. Patient drowsy but will answer questions  Will trial BiPAP however if unable to tolerate, will need intubation   1:10 AM ABG Improved to 7.135/89/163. Mom at bedside report patient squeezing hand when she woke him up. Still drowsy but opens eyes to tactile stimulation. Increase RR 30. Repeat ABG ordered for 2:30 AM    3:10 AM ABG reviewed. Improving hypercarbia. pH 7.18. Increase RR 35. RN reports patient starting to wake up more. Day team to encourage patient to wear BiPAP nightly for suspected OSA/OHS   Mechele Collin 02/26/2023, 10:32 PM

## 2023-02-26 NOTE — Progress Notes (Addendum)
To bedside to eval for possible intubation in setting of hypercarbic resp failure.   Lethargic morbidly obese M He does awaken to both noxious and loud stimuli and follow commands. He can reach his hands to his face.   Doesn't look like he has routinely worn BiPAP in the last several days at least, and with his habitus would not be surprised if there is a component of OHS/OSA.   Acute hypercarbic resp failure P Think we still have opportunity to trial BiPAP for now Turn off EN Will remain closely monitored & if mentation deteriorates to where BiPAP is contraindicated or clinical status otherwise further declines, will re-eval for intubation  ABG in 1hr   Tessie Fass MSN, AGACNP-BC [facility] 02/26/2023, 11:41 PM   STAFF MD NOTE   ATTESTATION & SIGNATURE   STAFF NOTE: I, Dr Lavinia Sharps have personally reviewed patient's available data, including medical history, events of note, physical examination and test results as part of my evaluation. I have discussed with resident/NP and other care providers such as pharmacist, RN and RRT.  In addition,  I personally evaluated patient and elicited key findings of   S: Date of admit 01/21/2023 with LOS 37 for today 02/27/2023 : Phillip Young is  -asked by Elink to see the patient in rapid response situation.  Patient is extremely drowsy.  With heavy shouting he will open his eyes and try to follow commands but is actually not following commands.  He will be able to stare at you.  According to nursing he refuses BiPAP.  Does not use BiPAP in several days.  He then gets drowsy.  But patient is full code.  Review of the records indicate prior intubation grade 2 view.  Intubation was done by critical care.  O:  Blood pressure 97/61, pulse (!) 109, temperature 98.7 F (37.1 C), temperature source Axillary, resp. rate (!) 25, height 6' (1.829 m), weight (!) 181.7 kg, SpO2 98%.   Extremely morbidly obese.  Is on CRRT.   He has a RASS sedation score -3 equivalent.   LABS    PULMONARY Recent Labs  Lab 02/24/23 0745 02/24/23 0747 02/24/23 1552 02/25/23 0500 02/25/23 2005 02/26/23 0403 02/26/23 0404 02/26/23 0420 02/26/23 1235 02/26/23 1237 02/26/23 1955 02/26/23 2304  PHART  --  7.372  --   --   --   --   --   --   --   --   --  7.089*  PCO2ART  --  41.7  --   --   --   --   --   --   --   --   --  101.3*  PO2ART  --  99  --   --   --   --   --   --   --   --   --  125*  HCO3 24.3 24.2  --   --   --   --   --   --   --   --   --  30.7*  TCO2 26 25   < >  --    < >  --    < > 29 28 26 31  34*  O2SAT 35 97  --  59  --  71.5  --   --   --   --   --  97   < > = values in this interval not displayed.    CBC Recent Labs  Lab 02/22/23  0421 02/22/23 0831 02/23/23 0406 02/23/23 0438 02/25/23 0500 02/25/23 2005 02/26/23 1237 02/26/23 1955 02/26/23 2304  HGB 8.1*   < > 7.9*   < > 8.5*   < > 9.5* 9.2* 8.8*  HCT 26.7*   < > 25.6*   < > 27.1*   < > 28.0* 27.0* 26.0*  WBC 7.7  --  7.5  --  8.0  --   --   --   --   PLT 246  --  235  --  255  --   --   --   --    < > = values in this interval not displayed.    COAGULATION No results for input(s): "INR" in the last 168 hours.  CARDIAC  No results for input(s): "TROPONINI" in the last 168 hours. No results for input(s): "PROBNP" in the last 168 hours.   CHEMISTRY Recent Labs  Lab 02/22/23 0421 02/22/23 0831 02/23/23 0406 02/23/23 4782 02/24/23 0409 02/24/23 0411 02/25/23 0500 02/25/23 1331 02/25/23 2005 02/26/23 0403 02/26/23 0404 02/26/23 1235 02/26/23 1237 02/26/23 1548 02/26/23 1954 02/26/23 1955 02/26/23 2304  NA 131*   < > 130*   < > 135   < > 131* 132*   < > 132*   < > 131* 134* 131* 135 131* 131*  K 4.6   < > 4.3   < > 4.2   < > 4.4 6.0*   < > 5.5*   < > 5.7* 5.2* 6.0* 5.6* 5.5* 5.9*  CL 97*   < > 93*   < > 94*   < > 93* 92*   < > 91*   < > 95* 94* 92* 91* 95*  --   CO2 20*   < > 22   < > 22   < > 23 23  --  26  --    --   --  27 27  --   --   GLUCOSE 205*   < > 210*   < > 135*   < > 264* 138*   < > 239*   < > 205* 236* 194* 178* 183*  --   BUN 51*   < > 38*   < > 39*   < > 49* 70*   < > 65*   < > 78* 66* 57* 58* 74*  --   CREATININE 2.58*   < > 2.05*   < > 2.01*   < > 2.05* 3.07*   < > 2.97*   < > 2.70* 2.10* 2.52* 2.54* 2.50*  --   CALCIUM 8.6*   < > 9.6   < > 10.0   < > 9.5 9.8  --  8.8*  --   --   --  8.5* 8.7*  --   --   MG 2.0  --  1.8  --  2.1  --  1.7  --   --  2.1  --   --   --   --   --   --   --   PHOS 4.1   < > 4.7*   < > 4.4   < > 4.2 6.3*  --  6.0*  --   --   --  5.2* 5.6*  --   --    < > = values in this interval not displayed.   Estimated Creatinine Clearance: 68.2 mL/min (A) (by C-G formula based on SCr of 2.5 mg/dL (H)).   LIVER  Recent Labs  Lab 02/25/23 0500 02/25/23 1331 02/26/23 0403 02/26/23 1548 02/26/23 1954  ALBUMIN 2.0* 2.3* 2.2* 2.2* 2.3*     INFECTIOUS No results for input(s): "LATICACIDVEN", "PROCALCITON" in the last 168 hours.   ENDOCRINE CBG (last 3)  Recent Labs    02/26/23 1547 02/26/23 1951 02/26/23 2301  GLUCAP 172* 153* 170*         IMAGING x24h  - image(s) personally visualized  -   highlighted in bold No results found.    A: Acute hypercapnic respiratory acidosis and failure  P: Start BiPAP with PEEP of 10 and PEEP of 20.  Recheck blood gas closely.  Monitor very closely.  Intubate if needed.  At this point in time we will hold off on intubation but give him a chance with BiPAP.  He is protecting airway.   Anti-infectives (From admission, onward)    Start     Dose/Rate Route Frequency Ordered Stop   02/16/23 1200  DAPTOmycin (CUBICIN) 900 mg in sodium chloride 0.9 % IVPB        8 mg/kg  113.8 kg (Adjusted) 136 mL/hr over 30 Minutes Intravenous Daily 02/16/23 1109 02/22/23 1450   02/14/23 1030  fluconazole (DIFLUCAN) IVPB 800 mg  Status:  Discontinued        800 mg 100 mL/hr over 240 Minutes Intravenous Every 24 hours 02/14/23 0941  02/16/23 1347   02/04/23 1315  linezolid (ZYVOX) IVPB 600 mg        600 mg 300 mL/hr over 60 Minutes Intravenous Every 12 hours 02/04/23 1218 02/10/23 2256   01/30/23 1000  linezolid (ZYVOX) IVPB 600 mg  Status:  Discontinued        600 mg 300 mL/hr over 60 Minutes Intravenous Every 12 hours 01/30/23 0804 02/01/23 0801   01/30/23 0915  meropenem (MERREM) 1 g in sodium chloride 0.9 % 100 mL IVPB        1 g 200 mL/hr over 30 Minutes Intravenous Every 8 hours 01/30/23 0816 02/10/23 1815   01/23/23 1200  vancomycin (VANCOREADY) IVPB 2000 mg/400 mL  Status:  Discontinued        2,000 mg 200 mL/hr over 120 Minutes Intravenous Every 24 hours 01/22/23 1103 01/24/23 0940   01/23/23 1045  cefTRIAXone (ROCEPHIN) 2 g in sodium chloride 0.9 % 100 mL IVPB  Status:  Discontinued        2 g 200 mL/hr over 30 Minutes Intravenous Every 24 hours 01/23/23 0958 01/30/23 0816   01/23/23 0000  piperacillin-tazobactam (ZOSYN) IVPB 3.375 g  Status:  Discontinued        3.375 g 100 mL/hr over 30 Minutes Intravenous Every 6 hours 01/22/23 1644 01/23/23 0958   01/22/23 1845  cefTRIAXone (ROCEPHIN) 2 g in sodium chloride 0.9 % 100 mL IVPB  Status:  Discontinued        2 g 200 mL/hr over 30 Minutes Intravenous Every 24 hours 01/21/23 2219 01/22/23 0930   01/22/23 1400  piperacillin-tazobactam (ZOSYN) IVPB 2.25 g  Status:  Discontinued        2.25 g 100 mL/hr over 30 Minutes Intravenous Every 8 hours 01/22/23 1001 01/22/23 1103   01/22/23 1200  vancomycin (VANCOCIN) IVPB 1000 mg/200 mL premix  Status:  Discontinued        1,000 mg 200 mL/hr over 60 Minutes Intravenous Every T-Th-Sa (Hemodialysis) 01/21/23 1838 01/22/23 1103   01/22/23 1200  piperacillin-tazobactam (ZOSYN) IVPB 3.375 g  Status:  Discontinued  3.375 g 100 mL/hr over 30 Minutes Intravenous Every 6 hours 01/22/23 1103 01/22/23 1644   01/22/23 0230  vancomycin (VANCOCIN) 2,500 mg in sodium chloride 0.9 % 500 mL IVPB        2,500 mg 262.5 mL/hr  over 120 Minutes Intravenous  Once 01/22/23 0131 01/22/23 0347   01/21/23 1845  vancomycin (VANCOCIN) 2,500 mg in sodium chloride 0.9 % 500 mL IVPB  Status:  Discontinued        2,500 mg 262.5 mL/hr over 120 Minutes Intravenous STAT 01/21/23 1831 01/22/23 0131   01/21/23 1845  ceFEPIme (MAXIPIME) 1 g in sodium chloride 0.9 % 100 mL IVPB  Status:  Discontinued        1 g 200 mL/hr over 30 Minutes Intravenous Every 24 hours 01/21/23 1832 01/21/23 2219        Rest per NP/medical resident whose note is outlined above and that I agree with  The patient is critically ill with multiple organ systems failure and requires high complexity decision making for assessment and support, frequent evaluation and titration of therapies, application of advanced monitoring technologies and extensive interpretation of multiple databases.   Critical Care Time devoted to patient care services described in this note is  30  Minutes. This time reflects time of care of this signee Dr Kalman Shan. This critical care time does not reflect procedure time, or teaching time or supervisory time of PA/NP/Med student/Med Resident etc but could involve care discussion time     Dr. Kalman Shan, M.D., [facility] Pulmonary and Critical Care Medicine Staff Physician [facility] [place] [facility] Pager: [phone], If no answer or between  15:00h - 7:00h: call [phone]  02/27/2023 12:54 AM

## 2023-02-26 NOTE — Progress Notes (Signed)
Pt placed on BIPAP per MD even with poor mental status. RN says he will stay at bedside for poor mental status until family gets here.

## 2023-02-26 NOTE — Progress Notes (Signed)
Received call from bedside RN K 6.0 Is on 4/0 bath d/t citrate- don't have 2/0 bath Will stop banatrol (banana flakes) Lokelma 10 g BID, to start today Recheck K 8 pm Hopefully now that catheter running better will achieve better clearance and will also help to bring down K  Bufford Buttner MD [facility]

## 2023-02-26 NOTE — Progress Notes (Signed)
Welcome Phillip Young  Subjective:  Seen and examined in ICU. Remains on high dose vasopressor support. I/Os 3.9 / 1.1 yesterday. Due to instability yesterday did not go to IR for [facility] exchange for malfunction but rather PCCM inserted temp line bedside.  This has been running well per RN.   Out of Atrial arrhythmia overnight - in ST currently.     Objective Vital signs in last 24 hours: Vitals:   02/14/23 1600 02/14/23 1615 02/14/23 1630 02/14/23 1645  BP:      Pulse:      Resp: (!) 43 (!) 38 (!) 35 (!) 31  Temp: 98.1 F (36.7 C)     TempSrc: Axillary     SpO2: 100%     Weight:      Height:         Physical Exam: chronically ill looking male, [place] O2 Heart:RRR, s1s2 nl Lungs:coarse breath sound b/l Abdomen: soft, +bs Extremities: 1+ bilat hip edema  Dialysis Access: RIJ TDC intact, LIJ temp HD catheter running crrt   OP HD: [facility] TTS  4h   180kg  425/1.5  TDC  Na citrate cath lock (no heparin) - last OP HD was 6/25, post wt 193.8kg - consistently left HD 8-12kg up at least the prior 3 wks - sensipar 60mg  three times per week - hectorol 4 mcg IV three times per week - mircera 200 mcg  IV q 2 wks, last 6/13, due 6/27    Assessment/ Plan:  # Hypotension - remains pressor dependent on NE; also getting midodrine 30 tid.   # Septic shock/ pneumococcal bacteremia: complex course, ID following, has defervesced. Last blood culture 7/23 negative.  Off abx now.   # Acute hypoxic and hypercapnic respiratory failure: patient is now extubated. Stable on HFNC  # ESRD - usual HD is TTS. Required emergent HD on 6/29 because of fluid overload.  Then developed shock and afib/ RVR requiring high doses of pressors.  Started CRRT on 6/29. Cont CRRT given pressor dependence at this time. 8/2 required temp HD catheter placement after TDC malfunctioned and unstable to go to IR.  Plan would be TDC exchange early week if stable.  For now cont to use temp LIJ line.    # Anemia of CKD:  on max ESA with variable Hbs, generally in the 9s.   # MBD ckd - on outpt cinacalcet, holding VDRA in setting of hyper Ca.   # Hypercalcemia: Noted patient has high corrected calcium level in 11s but stable, complication of citrate anticoagulation with normal ionized systemic Ca++.  He is on citrate protocol with calcium gluconate, and we are using 0 Ca fluids for post HD IVF's and dialysate.   # Volume -remains overloaded but improving - goal net even to net neg 50/hr for now.  Cont CRRT for volume removal at this time.   # CRRT anticoagulation - pt required change to citrate protocol for frequent clotting started 7/06.  We continue using citrate protocol due to excessive clotting and intolerance to heparin.   # Acute on chronic systolic CHF, nonischemic: EF 16-10%.  #A-fib with RVR with hypotension: S/P cardioversion 7/5 -> NSR -> afib RVR 7/6 early AM refractory to [facility] x3 + amiodarone. Persistent afib/flutter w/ HR > 100  #hyperkalemia: has been mildly elevated - cont 4K for now as CRRT should run continuously today  Estill Bakes MD [facility] Pager [phone]   Recent Labs  Lab 02/25/23 1331 02/25/23  2005 02/26/23 0403 02/26/23 0404 02/26/23 0420  HGB  --    < >  --  10.2* 9.5*  ALBUMIN 2.3*  --  2.2*  --   --   CALCIUM 9.8  --  8.8*  --   --   PHOS 6.3*  --  6.0*  --   --   CREATININE 3.07*   < > 2.97* 2.30* 2.90*  K 6.0*   < > 5.5* 5.1 5.5*   < > = values in this interval not displayed.    Inpatient medications:  apixaban  5 mg Per Tube BID   Chlorhexidine Gluconate Cloth  6 each Topical Q0600   darbepoetin (ARANESP) injection - DIALYSIS  200 mcg Subcutaneous Q Sat-1800   famotidine  20 mg Per Tube Daily   feeding supplement (PROSource TF20)  60 mL Per Tube Q4H   fiber supplement (BANATROL TF)  60 mL Per Tube BID   insulin aspart  0-9 Units Subcutaneous Q4H   insulin aspart  4 Units Subcutaneous Q4H   insulin glargine-yfgn  12  Units Subcutaneous Daily   lidocaine  1 patch Transdermal Q24H   melatonin  5 mg Per Tube QHS   midodrine  30 mg Oral TID WC   multivitamin  1 tablet Per Tube QHS   nutrition supplement (JUVEN)  1 packet Per Tube BID BM   mouth rinse  15 mL Mouth Rinse 4 times per day   QUEtiapine  100 mg Per Tube BID   sodium chloride flush  10-40 mL Intravenous Q12H    sodium chloride 10 mL/hr at 02/21/23 1444   anticoagulant sodium citrate     calcium gluconate 20 g in dextrose 5 % 1,000 mL infusion 50 mL/hr at 02/26/23 0900   citrate dextrose 320 mL/hr at 02/24/23 0420   feeding supplement (VITAL 1.5 CAL) 50 mL/hr at 02/26/23 0900   norepinephrine (LEVOPHED) Adult infusion 40 mcg/min (02/26/23 0900)   prismasol B22GK 4/0 1,500 mL/hr at 02/26/23 0839   prismasol B22GK 4/0 600 mL/hr at 02/26/23 0235   vasopressin Stopped (02/26/23 0700)   sodium chloride, acetaminophen **OR** acetaminophen, anticoagulant sodium citrate, bisacodyl, docusate, LORazepam, ondansetron (ZOFRAN) IV, mouth rinse, oxyCODONE, polyethylene glycol, polyvinyl alcohol, senna-docusate, sodium chloride flush, surgical lubricant

## 2023-02-27 DIAGNOSIS — J9601 Acute respiratory failure with hypoxia: Secondary | ICD-10-CM | POA: Diagnosis not present

## 2023-02-27 DIAGNOSIS — R570 Cardiogenic shock: Secondary | ICD-10-CM | POA: Diagnosis not present

## 2023-02-27 DIAGNOSIS — I5022 Chronic systolic (congestive) heart failure: Secondary | ICD-10-CM | POA: Diagnosis not present

## 2023-02-27 DIAGNOSIS — I4891 Unspecified atrial fibrillation: Secondary | ICD-10-CM | POA: Diagnosis not present

## 2023-02-27 LAB — POCT I-STAT, CHEM 8
BUN: 50 mg/dL — ABNORMAL HIGH (ref 6–20)
BUN: 64 mg/dL — ABNORMAL HIGH (ref 6–20)
BUN: 49 mg/dL — ABNORMAL HIGH (ref 6–20)
BUN: 62 mg/dL — ABNORMAL HIGH (ref 6–20)
BUN: 51 mg/dL — ABNORMAL HIGH (ref 6–20)
BUN: 61 mg/dL — ABNORMAL HIGH (ref 6–20)
Calcium, Ion: 0.33 mmol/L — CL (ref 1.15–1.40)
Calcium, Ion: 1.02 mmol/L — ABNORMAL LOW (ref 1.15–1.40)
Calcium, Ion: 0.37 mmol/L — CL (ref 1.15–1.40)
Calcium, Ion: 1.12 mmol/L — ABNORMAL LOW (ref 1.15–1.40)
Calcium, Ion: 0.36 mmol/L — CL (ref 1.15–1.40)
Calcium, Ion: 1.08 mmol/L — ABNORMAL LOW (ref 1.15–1.40)
Chloride: 94 mmol/L — ABNORMAL LOW (ref 98–111)
Chloride: 95 mmol/L — ABNORMAL LOW (ref 98–111)
Chloride: 94 mmol/L — ABNORMAL LOW (ref 98–111)
Chloride: 95 mmol/L — ABNORMAL LOW (ref 98–111)
Chloride: 93 mmol/L — ABNORMAL LOW (ref 98–111)
Chloride: 95 mmol/L — ABNORMAL LOW (ref 98–111)
Creatinine, Ser: 1.9 mg/dL — ABNORMAL HIGH (ref 0.61–1.24)
Creatinine, Ser: 2.5 mg/dL — ABNORMAL HIGH (ref 0.61–1.24)
Creatinine, Ser: 1.7 mg/dL — ABNORMAL HIGH (ref 0.61–1.24)
Creatinine, Ser: 2.3 mg/dL — ABNORMAL HIGH (ref 0.61–1.24)
Creatinine, Ser: 1.7 mg/dL — ABNORMAL HIGH (ref 0.61–1.24)
Creatinine, Ser: 2.2 mg/dL — ABNORMAL HIGH (ref 0.61–1.24)
Glucose, Bld: 145 mg/dL — ABNORMAL HIGH (ref 70–99)
Glucose, Bld: 186 mg/dL — ABNORMAL HIGH (ref 70–99)
Glucose, Bld: 120 mg/dL — ABNORMAL HIGH (ref 70–99)
Glucose, Bld: 162 mg/dL — ABNORMAL HIGH (ref 70–99)
Glucose, Bld: 112 mg/dL — ABNORMAL HIGH (ref 70–99)
Glucose, Bld: 160 mg/dL — ABNORMAL HIGH (ref 70–99)
HCT: 27 % — ABNORMAL LOW (ref 39.0–52.0)
HCT: 32 % — ABNORMAL LOW (ref 39.0–52.0)
HCT: 26 % — ABNORMAL LOW (ref 39.0–52.0)
HCT: 30 % — ABNORMAL LOW (ref 39.0–52.0)
HCT: 25 % — ABNORMAL LOW (ref 39.0–52.0)
HCT: 29 % — ABNORMAL LOW (ref 39.0–52.0)
Hemoglobin: 10.9 g/dL — ABNORMAL LOW (ref 13.0–17.0)
Hemoglobin: 9.2 g/dL — ABNORMAL LOW (ref 13.0–17.0)
Hemoglobin: 10.2 g/dL — ABNORMAL LOW (ref 13.0–17.0)
Hemoglobin: 8.8 g/dL — ABNORMAL LOW (ref 13.0–17.0)
Hemoglobin: 8.5 g/dL — ABNORMAL LOW (ref 13.0–17.0)
Hemoglobin: 9.9 g/dL — ABNORMAL LOW (ref 13.0–17.0)
Potassium: 5.2 mmol/L — ABNORMAL HIGH (ref 3.5–5.1)
Potassium: 5.6 mmol/L — ABNORMAL HIGH (ref 3.5–5.1)
Potassium: 4.4 mmol/L (ref 3.5–5.1)
Potassium: 4.8 mmol/L (ref 3.5–5.1)
Potassium: 4.2 mmol/L (ref 3.5–5.1)
Potassium: 4.4 mmol/L (ref 3.5–5.1)
Sodium: 131 mmol/L — ABNORMAL LOW (ref 135–145)
Sodium: 134 mmol/L — ABNORMAL LOW (ref 135–145)
Sodium: 134 mmol/L — ABNORMAL LOW (ref 135–145)
Sodium: 136 mmol/L (ref 135–145)
Sodium: 134 mmol/L — ABNORMAL LOW (ref 135–145)
Sodium: 137 mmol/L (ref 135–145)
TCO2: 29 mmol/L (ref 22–32)
TCO2: 30 mmol/L (ref 22–32)
TCO2: 28 mmol/L (ref 22–32)
TCO2: 31 mmol/L (ref 22–32)
TCO2: 29 mmol/L (ref 22–32)
TCO2: 29 mmol/L (ref 22–32)

## 2023-02-27 LAB — POCT I-STAT 7, (LYTES, BLD GAS, ICA,H+H)
Acid-Base Excess: 0 mmol/L (ref 0.0–2.0)
Acid-Base Excess: 0 mmol/L (ref 0.0–2.0)
Acid-Base Excess: 0 mmol/L (ref 0.0–2.0)
Bicarbonate: 30.4 mmol/L — ABNORMAL HIGH (ref 20.0–28.0)
Bicarbonate: 29.9 mmol/L — ABNORMAL HIGH (ref 20.0–28.0)
Bicarbonate: 28.3 mmol/L — ABNORMAL HIGH (ref 20.0–28.0)
Calcium, Ion: 1.02 mmol/L — ABNORMAL LOW (ref 1.15–1.40)
Calcium, Ion: 1.01 mmol/L — ABNORMAL LOW (ref 1.15–1.40)
Calcium, Ion: 1.02 mmol/L — ABNORMAL LOW (ref 1.15–1.40)
HCT: 27 % — ABNORMAL LOW (ref 39.0–52.0)
HCT: 27 % — ABNORMAL LOW (ref 39.0–52.0)
HCT: 26 % — ABNORMAL LOW (ref 39.0–52.0)
Hemoglobin: 9.2 g/dL — ABNORMAL LOW (ref 13.0–17.0)
Hemoglobin: 9.2 g/dL — ABNORMAL LOW (ref 13.0–17.0)
Hemoglobin: 8.8 g/dL — ABNORMAL LOW (ref 13.0–17.0)
O2 Saturation: 99 %
O2 Saturation: 100 %
O2 Saturation: 100 %
Patient temperature: 97.6
Patient temperature: 98.5
Potassium: 6 mmol/L — ABNORMAL HIGH (ref 3.5–5.1)
Potassium: 5.6 mmol/L — ABNORMAL HIGH (ref 3.5–5.1)
Potassium: 5.4 mmol/L — ABNORMAL HIGH (ref 3.5–5.1)
Sodium: 131 mmol/L — ABNORMAL LOW (ref 135–145)
Sodium: 131 mmol/L — ABNORMAL LOW (ref 135–145)
Sodium: 132 mmol/L — ABNORMAL LOW (ref 135–145)
TCO2: 33 mmol/L — ABNORMAL HIGH (ref 22–32)
TCO2: 32 mmol/L (ref 22–32)
TCO2: 30 mmol/L (ref 22–32)
pCO2 arterial: 89.6 mmHg (ref 32–48)
pCO2 arterial: 80.1 mmHg (ref 32–48)
pCO2 arterial: 68.8 mmHg (ref 32–48)
pH, Arterial: 7.135 — CL (ref 7.35–7.45)
pH, Arterial: 7.18 — CL (ref 7.35–7.45)
pH, Arterial: 7.223 — ABNORMAL LOW (ref 7.35–7.45)
pO2, Arterial: 163 mmHg — ABNORMAL HIGH (ref 83–108)
pO2, Arterial: 314 mmHg — ABNORMAL HIGH (ref 83–108)
pO2, Arterial: 296 mmHg — ABNORMAL HIGH (ref 83–108)

## 2023-02-27 LAB — RENAL FUNCTION PANEL
Albumin: 2.4 g/dL — ABNORMAL LOW (ref 3.5–5.0)
Albumin: 2.4 g/dL — ABNORMAL LOW (ref 3.5–5.0)
Anion gap: 16 — ABNORMAL HIGH (ref 5–15)
Anion gap: 15 (ref 5–15)
BUN: 53 mg/dL — ABNORMAL HIGH (ref 6–20)
BUN: 50 mg/dL — ABNORMAL HIGH (ref 6–20)
CO2: 25 mmol/L (ref 22–32)
CO2: 27 mmol/L (ref 22–32)
Calcium: 9.2 mg/dL (ref 8.9–10.3)
Calcium: 9.9 mg/dL (ref 8.9–10.3)
Chloride: 91 mmol/L — ABNORMAL LOW (ref 98–111)
Chloride: 91 mmol/L — ABNORMAL LOW (ref 98–111)
Creatinine, Ser: 2.47 mg/dL — ABNORMAL HIGH (ref 0.61–1.24)
Creatinine, Ser: 2.16 mg/dL — ABNORMAL HIGH (ref 0.61–1.24)
GFR, Estimated: 34 mL/min — ABNORMAL LOW (ref >60)
GFR, Estimated: 39 mL/min — ABNORMAL LOW (ref >60)
Glucose, Bld: 139 mg/dL — ABNORMAL HIGH (ref 70–99)
Glucose, Bld: 115 mg/dL — ABNORMAL HIGH (ref 70–99)
Phosphorus: 4.9 mg/dL — ABNORMAL HIGH (ref 2.5–4.6)
Phosphorus: 4.8 mg/dL — ABNORMAL HIGH (ref 2.5–4.6)
Potassium: 5.6 mmol/L — ABNORMAL HIGH (ref 3.5–5.1)
Potassium: 4.7 mmol/L (ref 3.5–5.1)
Sodium: 132 mmol/L — ABNORMAL LOW (ref 135–145)
Sodium: 133 mmol/L — ABNORMAL LOW (ref 135–145)

## 2023-02-27 LAB — GLUCOSE, CAPILLARY
Glucose-Capillary: 148 mg/dL — ABNORMAL HIGH (ref 70–99)
Glucose-Capillary: 88 mg/dL (ref 70–99)
Glucose-Capillary: 114 mg/dL — ABNORMAL HIGH (ref 70–99)
Glucose-Capillary: 111 mg/dL — ABNORMAL HIGH (ref 70–99)
Glucose-Capillary: 96 mg/dL (ref 70–99)

## 2023-02-27 LAB — CBC
HCT: 26.6 % — ABNORMAL LOW (ref 39.0–52.0)
Hemoglobin: 8.1 g/dL — ABNORMAL LOW (ref 13.0–17.0)
MCH: 33.2 pg (ref 26.0–34.0)
MCHC: 30.5 g/dL (ref 30.0–36.0)
MCV: 109 fL — ABNORMAL HIGH (ref 80.0–100.0)
Platelets: 279 10*3/uL (ref 150–400)
RBC: 2.44 MIL/uL — ABNORMAL LOW (ref 4.22–5.81)
RDW: 17.7 % — ABNORMAL HIGH (ref 11.5–15.5)
WBC: 11.8 10*3/uL — ABNORMAL HIGH (ref 4.0–10.5)
nRBC: 0 % (ref 0.0–0.2)

## 2023-02-27 LAB — COOXEMETRY PANEL
Carboxyhemoglobin: 2.2 % — ABNORMAL HIGH (ref 0.5–1.5)
Methemoglobin: 0.8 % (ref 0.0–1.5)
O2 Saturation: 87.9 %
Total hemoglobin: 8.5 g/dL — ABNORMAL LOW (ref 12.0–16.0)

## 2023-02-27 LAB — CALCIUM, IONIZED: Calcium, Ionized, Serum: 5 mg/dL (ref 4.5–5.6)

## 2023-02-27 LAB — MAGNESIUM: Magnesium: 2.1 mg/dL (ref 1.7–2.4)

## 2023-02-27 NOTE — Progress Notes (Addendum)
Post filter ionized calcium 0.37, systemic ionized calcium 1.12. No changes made to calcium or citrate per protocol. Next check to occur at 2000.

## 2023-02-27 NOTE — Progress Notes (Signed)
[facility] NEPHROLOGY PROGRESS NOTE  Subjective:  unresponsive yesterday - ABG pCO2 > 100, placed on bipap with improvement.  K high - banana flakes discontinued and rec'd lokelma.  2k/0Ca dialysate confirmed not available. +1.4L  yesterday, CRRT cont.  Remains on vasopressors which have weaned down since he converted spontaneously to ST this weekend.    Objective Vital signs in last 24 hours: Vitals:   02/14/23 1600 02/14/23 1615 02/14/23 1630 02/14/23 1645  BP:      Pulse:      Resp: (!) 43 (!) 38 (!) 35 (!) 31  Temp: 98.1 F (36.7 C)     TempSrc: Axillary     SpO2: 100%     Weight:      Height:         Physical Exam: chronically ill looking male, [place] O2 Heart:RRR, s1s2 nl Lungs:coarse breath sound b/l Abdomen: soft, +bs Extremities: 1+ bilat hip edema  Dialysis Access: RIJ TDC intact, LIJ temp HD catheter running crrt   OP HD: [facility] TTS  4h   180kg  425/1.5  TDC  Na citrate cath lock (no heparin) - last OP HD was 6/25, post wt 193.8kg - consistently left HD 8-12kg up at least the prior 3 wks - sensipar 60mg  three times per week - hectorol 4 mcg IV three times per week - mircera 200 mcg  IV q 2 wks, last 6/13, due 6/27    Assessment/ Plan:  # Hypotension - remains pressor dependent on NE; also getting midodrine 30 tid but coming down.   # Septic shock/ pneumococcal bacteremia: complex course, ID following, has defervesced. Last blood culture 7/23 negative.  Off abx now.   # Acute hypoxic and hypercapnic respiratory failure: patient is now extubated. Stable on HFNC  # ESRD - usual HD is TTS. Required emergent HD on 6/29 because of fluid overload.  Then developed shock and afib/ RVR requiring high doses of pressors.  Started CRRT on 6/29. Cont CRRT given pressor dependence at this time. 8/2 required temp HD catheter placement after TDC malfunctioned and unstable to go to IR for exchange over wire.  Plan would be TDC exchange early week if stable.  For  now cont to use temp LIJ line.   # Anemia of CKD:  on max ESA with variable Hbs, generally in the 9s.   # MBD ckd - on outpt cinacalcet, holding VDRA in setting of hyper Ca.   # Hypercalcemia: Noted patient has high corrected calcium level in 11s but stable, complication of citrate anticoagulation with normal ionized systemic Ca++.  He is on citrate protocol with calcium gluconate, and we are using 0 Ca fluids for post HD IVF's and dialysate.   # Volume -remains overloaded but improving - goal net even to net neg 50/hr for now.  Cont CRRT for volume removal at this time.   # CRRT anticoagulation - pt required change to citrate protocol for frequent clotting started 7/06.  We continue using citrate protocol due to excessive clotting and intolerance to heparin.   # Acute on chronic systolic CHF, nonischemic: EF 21-30%.  #A-fib with RVR with hypotension: S/P cardioversion 7/5 -> NSR -> afib RVR 7/6 early AM refractory to [facility] x3 + amiodarone. Persistent afib/flutter w/ HR > 100  #hyperkalemia: banana flakes stopped. On 4K dialysate given need for 0Ca dialysate related to citrate.  Will schedule lokelma for now as this has been fairly persistent.   Estill Bakes MD [facility]  Pager [phone]   Recent Labs  Lab 02/26/23 1954 02/26/23 1955 02/27/23 0346 02/27/23 0356 02/27/23 1211 02/27/23 1219  HGB  --    < > 8.1*   < > 10.2* 8.8*  ALBUMIN 2.3*  --  2.4*  --   --   --   CALCIUM 8.7*  --  9.2  --   --   --   PHOS 5.6*  --  4.9*  --   --   --   CREATININE 2.54*   < > 2.47*   < > 1.70* 2.30*  K 5.6*   < > 5.6*   < > 4.4 4.8   < > = values in this interval not displayed.    Inpatient medications:  apixaban  5 mg Per Tube BID   Chlorhexidine Gluconate Cloth  6 each Topical Q0600   darbepoetin (ARANESP) injection - DIALYSIS  200 mcg Subcutaneous Q Sat-1800   famotidine  20 mg Per Tube Daily   feeding supplement (PROSource TF20)  60 mL Per Tube Q4H   insulin aspart   0-9 Units Subcutaneous Q4H   lidocaine  1 patch Transdermal Q24H   melatonin  5 mg Per Tube QHS   midodrine  30 mg Oral TID WC   multivitamin  1 tablet Per Tube QHS   nutrition supplement (JUVEN)  1 packet Per Tube BID BM   mouth rinse  15 mL Mouth Rinse 4 times per day   sodium chloride flush  10-40 mL Intravenous Q12H   sodium zirconium cyclosilicate  10 g Oral BID    sodium chloride 10 mL/hr at 02/21/23 1444   anticoagulant sodium citrate     calcium gluconate 20 g in dextrose 5 % 1,000 mL infusion 50 mL/hr at 02/27/23 1200   citrate dextrose 3,000 mL (02/27/23 0730)   feeding supplement (VITAL 1.5 CAL) Stopped (02/27/23 0001)   norepinephrine (LEVOPHED) Adult infusion 15 mcg/min (02/27/23 1200)   prismasol B22GK 4/0 1,500 mL/hr at 02/27/23 1123   prismasol B22GK 4/0 600 mL/hr at 02/27/23 1151   vasopressin Stopped (02/27/23 0405)   sodium chloride, acetaminophen **OR** acetaminophen, anticoagulant sodium citrate, bisacodyl, docusate, ondansetron (ZOFRAN) IV, mouth rinse, oxyCODONE, polyethylene glycol, polyvinyl alcohol, senna-docusate, sodium chloride flush, surgical lubricant

## 2023-02-27 NOTE — Plan of Care (Signed)
  Problem: Education: Goal: Knowledge of General Education information will improve Description: Including pain rating scale, medication(s)/side effects and non-pharmacologic comfort measures Outcome: Progressing   Problem: Health Behavior/Discharge Planning: Goal: Ability to manage health-related needs will improve Outcome: Progressing   Problem: Clinical Measurements: Goal: Ability to maintain clinical measurements within normal limits will improve Outcome: Progressing Goal: Will remain free from infection Outcome: Progressing Goal: Diagnostic test results will improve Outcome: Progressing Goal: Respiratory complications will improve Outcome: Progressing Goal: Cardiovascular complication will be avoided Outcome: Progressing   Problem: Activity: Goal: Risk for activity intolerance will decrease Outcome: Progressing   Problem: Nutrition: Goal: Adequate nutrition will be maintained Outcome: Progressing   Problem: Coping: Goal: Level of anxiety will decrease Outcome: Progressing   Problem: Elimination: Goal: Will not experience complications related to bowel motility Outcome: Progressing   Problem: Pain Managment: Goal: General experience of comfort will improve Outcome: Progressing   Problem: Safety: Goal: Ability to remain free from injury will improve Outcome: Progressing   Problem: Skin Integrity: Goal: Risk for impaired skin integrity will decrease Outcome: Progressing   Problem: Education: Goal: Ability to describe self-care measures that may prevent or decrease complications (Diabetes Survival Skills Education) will improve Outcome: Progressing Goal: Individualized Educational Video(s) Outcome: Progressing   Problem: Coping: Goal: Ability to adjust to condition or change in health will improve Outcome: Progressing   Problem: Fluid Volume: Goal: Ability to maintain a balanced intake and output will improve Outcome: Progressing   Problem: Health  Behavior/Discharge Planning: Goal: Ability to identify and utilize available resources and services will improve Outcome: Progressing Goal: Ability to manage health-related needs will improve Outcome: Progressing   Problem: Metabolic: Goal: Ability to maintain appropriate glucose levels will improve Outcome: Progressing   Problem: Nutritional: Goal: Maintenance of adequate nutrition will improve Outcome: Progressing Goal: Progress toward achieving an optimal weight will improve Outcome: Progressing   Problem: Skin Integrity: Goal: Risk for impaired skin integrity will decrease Outcome: Progressing   Problem: Tissue Perfusion: Goal: Adequacy of tissue perfusion will improve Outcome: Progressing

## 2023-02-27 NOTE — Progress Notes (Signed)
Notified by pharmacy that citrate supply is running low and there is potential that there will not be another bag to replace pre filter solution.  Glenna Fellows MD made aware.  Orders received to change prescription to:  Pre-blood pump (PBP/ white scale): PrismaSol BGK 4/2.5 @ 442mL/hr  Dialysate: PrismaSol BGK 4/2.5 @ 1544mL/hr  Post-filter (purple scale) replacement fluid: PrismaSol BGK 4/2.5 @ 469mL/ hr.  **Discontinue Calcium gluconate infusion when citrate is stopped**  These orders are to be initiated only if citrate supply runs out. Disregard if citrate becomes available. Verbal orders taken with read back provided.

## 2023-02-27 NOTE — Progress Notes (Signed)
NAME:  Phillip Young, MRN:  [id number], DOB:  [birth year], LOS: 37 ADMISSION DATE:  01/21/2023, CONSULTATION DATE:  01/22/23 REFERRING MD:  Danise Edge, CHIEF COMPLAINT:  SOB   History of Present Illness:  37 year old man well known to the PCCM and AHF services who presented to [facility] 6/28 with fevers, joint pains, SOB. PMHx significant for ESRD, NICM, refractory AFib, ?amio-induced lung toxicity. Patient reportedly missed HD on Thursday (has a history of leaving HD early). Reportedly ~10kg over dry weight with worsening breathing status, AFib/RVR and borderline BP.  PCCM consulted for ICU admission/further evaluation and management.  Pertinent Medical History:   Past Medical History:  Diagnosis Date   Acute on chronic respiratory failure with hypoxia ([name]) 04/21/2021   Acute on chronic systolic (congestive) heart failure ([name]) 02/26/2020   Amiodarone toxicity    Anemia    Atrial flutter ([name])    Biventricular congestive heart failure ([name])    Chronic hypoxemic respiratory failure ([name])    Class 3 severe obesity due to excess calories with serious comorbidity and body mass index (BMI) of 50.0 to 59.9 in adult [facility]) 02/26/2020   Essential hypertension 02/26/2020   GERD without esophagitis 02/26/2020   Hidradenitis suppurativa 02/26/2020   NICM (nonischemic cardiomyopathy) ([name])    Obesity hypoventilation syndrome ([name])    OSA (obstructive sleep apnea)    PAF (paroxysmal atrial fibrillation) ([name])    Pneumonia    Prediabetes 02/26/2020   Significant Hospital Events: Including procedures, antibiotic start and stop dates in addition to other pertinent events   6/28 Admit, emergent HD 6/29 Afib/RVR, shock, ICU transfer 6/30 Intubated and placed on vasopressor support and started on CRRT 7/1 Remains intubated on CRRT continue with pressor support 7/2 Agitated with SAT, remains on CRRT and low dose levophed 7/3 low dose NE, ongoing CRRT 7/4 Attempted SBT this am, apneic but severely agitated,  desaturated, HR up 130/140s.  Re-sedated but HR remains 140's, NE up from 6 to . NSR on EKG.  Was pulling 50-100on UF, since kept even for now.  CVP 8 7/5 DCCV again attempted this a.m. with success, remains on norepinephrine and vasopressin.  7/6 went into A-fib with RVR, attempted DCCV x 3, started on amiodarone infusion after amiodarone bolus 7/7 Multiple DCCV attempts without success. TF held due to vomiting. Tx denied to [facility]  7/8 KUB looked OK. No further vomiting. Tolerating TF. Large BM overnight.  7/9 vasopressors back up, remains rapid afib/ flutter, Bmx 1, wua causing hemodynamic instability 7/10 weaned sedation on mothers request, HR remains 140-220s, tmax 100.6, DNR, no escalation.  Heart failure signed off, not a candidate for advanced heart failure therapies at this time 7/12 [facility] denied transfer, CODE STATUS changed from DNR back to full code 7/13 Amiodarone restarted with ESR trend  7/16 Amio stopped with no improvement see in rate control  7/20 extubated 7/22 Issues with agitation post extubation, improved with Precedex. Tmax 101, respiratory culture with moderate yeast and fluconazole started 7/23 ID consulted  7/24 ID stopped fluconazole and started Daptomycin for fever of unknown origin  7/25 Intermittent BIPAP need 7/28 Remains on vasopressor support with Levophed and vasopressin. Refractory A-fib with heart rate in 140s to 160s 7/29 CVC dislodged during attempts to ambulate, CRRT stopped and central access utilized for both levo and vaso.  PICC line order placed 7/30 PICC placed overnight, back on CRRT and levophed/.vaso and CRRT 7/31 no changes, Dr. Denese Killings starting Digoxin  Interim History / Subjective:  Patient was found completely unresponsive,  ABG was done overnight showing pCO2 over 100 Started on BiPAP with improvement in pCO2 in the 60s He remains on BiPAP Mental status has improved, following commands   Objective:  Blood pressure 97/61, pulse (!) 108,  temperature 98.9 F (37.2 C), temperature source Axillary, resp. rate (!) 30, height 6' (1.829 m), weight (!) 183.4 kg, SpO2 100%. CVP:  [12 mmHg-33 mmHg] 12 mmHg  Vent Mode: BIPAP;PCV FiO2 (%):  [70 %-100 %] 70 % Set Rate:  [25 bmp-35 bmp] 30 bmp PEEP:  [5 cmH20-10 cmH20] 10 cmH20   Intake/Output Summary (Last 24 hours) at 02/27/2023 0846 Last data filed at 02/27/2023 0800 Gross per 24 hour  Intake 3581.47 ml  Output 2284.4 ml  Net 1297.07 ml   Filed Weights   02/25/23 0500 02/26/23 0500 02/27/23 0500  Weight: (!) 182.1 kg (!) 181.7 kg (!) 183.4 kg    Physical exam: General: Acute on chronically ill-appearing morbidly obese male, lying on the bed HEENT: [place]/AT, eyes anicteric.  moist mucus membranes Neuro: Alert, awake following commands Chest: Reduced air entry at the bases, no wheezes or rhonchi Heart: Tachycardic, regular rhythm, no murmurs or gallops Abdomen: Soft, obese, bowel sounds present Skin: No rash   Labs and images were reviewed  Resolved Hospital Problem List:  Hematuria due to traumatic Foley insertion Pneumococcal bacteremia Fever of unknown origin  Bilateral multifocal pneumonia  Assessment & Plan:  Acute-on-chronic hypoxemic/hypercapnic respiratory failure OHS/OSA Acute metabolic encephalopathy in the setting of hypercapnia Noncompliant with CPAP at baseline Initially strep and superimposed HCAP Overnight found to be obtunded, ABG was done which showed pH 7.0 with pCO2 over 100 Placed on BiPAP with improvement in pH to 7.2 and pCO2 trended down to 60s Continue BiPAP for now, then transition back to high flow He needs to be on BiPAP overnight Completed antibiotic therapy x 2 Remain afebrile  Acute on chronic HFrEF with pulmonary edema Persistent A-fib/flutter with RVR Nonischemic cardiomyopathy EF less than 20% Mixed shock septic/cardiogenic Patient spontaneous converted to sinus rhythm and remained in sinus rhythm for 36 hours Many conversations  regarding limited therapeutic options and HF team signed off 7/10, mother and patient feel that he can improve and want him transferred to another facility, no accepting facility found at [facility] or [facility] remain low but stable Remain on Levophed and vasopressin with MAP goal 65, unable to come off of it On high-dose midodrine Continue Eliquis Septic component of shock has resolved, continue to remain in cardiogenic shock  End-stage renal disease on hemodialysis Recurrent metabolic acidosis, improving Metabolic bone disease  Hypercalcemia  Nephrology following, appreciate assistance On CRRT  Hypervolemic hyponatremia Hyperphosphatemia Hyperkalemia  Hypocalcemia Closely monitor electrolytes  Anemia of chronic disease Monitor H&H and transfuse if less than 7  Skin breakdown, not POA Perineum, pt refusing rectal tube Continue local wound care   Best Practice (right click and "Reselect all SmartList Selections" daily)   Diet/type: TF DVT prophylaxis: DOAC GI prophylaxis: Famotidine Lines: Yes still needed Foley: n/a Code Status:  Full Last date of multidisciplinary goals of care: 7/30 continue full code and full scope of care     The patient is critically ill due to cardiogenic shock/persistent A-fib critical care was necessary to treat or prevent imminent or life-threatening deterioration.  Critical care was time spent personally by me on the following activities: development of treatment plan with patient and/or surrogate as well as nursing, discussions with consultants, evaluation of patient's response to treatment, examination of patient, obtaining history  from patient or surrogate, ordering and performing treatments and interventions, ordering and review of laboratory studies, ordering and review of radiographic studies, pulse oximetry, re-evaluation of patient's condition and participation in multidisciplinary rounds.   During this encounter critical care time was  devoted to patient care services described in this note for 35 minutes.     Cheri Fowler, MD [facility] See Amion for pager If no response to pager, please call [phone] until 7pm After 7pm, Please call E-link [phone]

## 2023-02-28 ENCOUNTER — Inpatient Hospital Stay (HOSPITAL_COMMUNITY): Payer: BC Managed Care – PPO

## 2023-02-28 DIAGNOSIS — I5022 Chronic systolic (congestive) heart failure: Secondary | ICD-10-CM | POA: Diagnosis not present

## 2023-02-28 DIAGNOSIS — R570 Cardiogenic shock: Secondary | ICD-10-CM | POA: Diagnosis not present

## 2023-02-28 DIAGNOSIS — I4891 Unspecified atrial fibrillation: Secondary | ICD-10-CM | POA: Diagnosis not present

## 2023-02-28 DIAGNOSIS — J9601 Acute respiratory failure with hypoxia: Secondary | ICD-10-CM | POA: Diagnosis not present

## 2023-02-28 LAB — CBC
HCT: 24.5 % — ABNORMAL LOW (ref 39.0–52.0)
Hemoglobin: 7.4 g/dL — ABNORMAL LOW (ref 13.0–17.0)
MCH: 32.3 pg (ref 26.0–34.0)
MCHC: 30.2 g/dL (ref 30.0–36.0)
MCV: 107 fL — ABNORMAL HIGH (ref 80.0–100.0)
Platelets: 238 10*3/uL (ref 150–400)
RBC: 2.29 MIL/uL — ABNORMAL LOW (ref 4.22–5.81)
RDW: 17.7 % — ABNORMAL HIGH (ref 11.5–15.5)
WBC: 7 10*3/uL (ref 4.0–10.5)
nRBC: 0 % (ref 0.0–0.2)

## 2023-02-28 LAB — POCT I-STAT, CHEM 8
BUN: 54 mg/dL — ABNORMAL HIGH (ref 6–20)
BUN: 62 mg/dL — ABNORMAL HIGH (ref 6–20)
BUN: 54 mg/dL — ABNORMAL HIGH (ref 6–20)
BUN: 59 mg/dL — ABNORMAL HIGH (ref 6–20)
Calcium, Ion: 0.47 mmol/L — CL (ref 1.15–1.40)
Calcium, Ion: 1.1 mmol/L — ABNORMAL LOW (ref 1.15–1.40)
Calcium, Ion: 0.45 mmol/L — CL (ref 1.15–1.40)
Calcium, Ion: 1.05 mmol/L — ABNORMAL LOW (ref 1.15–1.40)
Chloride: 94 mmol/L — ABNORMAL LOW (ref 98–111)
Chloride: 95 mmol/L — ABNORMAL LOW (ref 98–111)
Chloride: 93 mmol/L — ABNORMAL LOW (ref 98–111)
Chloride: 94 mmol/L — ABNORMAL LOW (ref 98–111)
Creatinine, Ser: 1.9 mg/dL — ABNORMAL HIGH (ref 0.61–1.24)
Creatinine, Ser: 2.3 mg/dL — ABNORMAL HIGH (ref 0.61–1.24)
Creatinine, Ser: 1.8 mg/dL — ABNORMAL HIGH (ref 0.61–1.24)
Creatinine, Ser: 2.2 mg/dL — ABNORMAL HIGH (ref 0.61–1.24)
Glucose, Bld: 108 mg/dL — ABNORMAL HIGH (ref 70–99)
Glucose, Bld: 146 mg/dL — ABNORMAL HIGH (ref 70–99)
Glucose, Bld: 121 mg/dL — ABNORMAL HIGH (ref 70–99)
Glucose, Bld: 143 mg/dL — ABNORMAL HIGH (ref 70–99)
HCT: 24 % — ABNORMAL LOW (ref 39.0–52.0)
HCT: 27 % — ABNORMAL LOW (ref 39.0–52.0)
HCT: 24 % — ABNORMAL LOW (ref 39.0–52.0)
HCT: 26 % — ABNORMAL LOW (ref 39.0–52.0)
Hemoglobin: 8.2 g/dL — ABNORMAL LOW (ref 13.0–17.0)
Hemoglobin: 9.2 g/dL — ABNORMAL LOW (ref 13.0–17.0)
Hemoglobin: 8.2 g/dL — ABNORMAL LOW (ref 13.0–17.0)
Hemoglobin: 8.8 g/dL — ABNORMAL LOW (ref 13.0–17.0)
Potassium: 4.1 mmol/L (ref 3.5–5.1)
Potassium: 4.1 mmol/L (ref 3.5–5.1)
Potassium: 3.9 mmol/L (ref 3.5–5.1)
Potassium: 4 mmol/L (ref 3.5–5.1)
Sodium: 135 mmol/L (ref 135–145)
Sodium: 136 mmol/L (ref 135–145)
Sodium: 136 mmol/L (ref 135–145)
Sodium: 137 mmol/L (ref 135–145)
TCO2: 29 mmol/L (ref 22–32)
TCO2: 29 mmol/L (ref 22–32)
TCO2: 27 mmol/L (ref 22–32)
TCO2: 29 mmol/L (ref 22–32)

## 2023-02-28 LAB — GLUCOSE, CAPILLARY
Glucose-Capillary: 112 mg/dL — ABNORMAL HIGH (ref 70–99)
Glucose-Capillary: 61 mg/dL — ABNORMAL LOW (ref 70–99)
Glucose-Capillary: 64 mg/dL — ABNORMAL LOW (ref 70–99)
Glucose-Capillary: 69 mg/dL — ABNORMAL LOW (ref 70–99)
Glucose-Capillary: 71 mg/dL (ref 70–99)
Glucose-Capillary: 73 mg/dL (ref 70–99)
Glucose-Capillary: 74 mg/dL (ref 70–99)
Glucose-Capillary: 80 mg/dL (ref 70–99)
Glucose-Capillary: 82 mg/dL (ref 70–99)
Glucose-Capillary: 96 mg/dL (ref 70–99)

## 2023-02-28 LAB — POCT I-STAT 7, (LYTES, BLD GAS, ICA,H+H)
Acid-Base Excess: 2 mmol/L (ref 0.0–2.0)
Acid-Base Excess: 4 mmol/L — ABNORMAL HIGH (ref 0.0–2.0)
Acid-Base Excess: 1 mmol/L (ref 0.0–2.0)
Bicarbonate: 29.4 mmol/L — ABNORMAL HIGH (ref 20.0–28.0)
Bicarbonate: 30.3 mmol/L — ABNORMAL HIGH (ref 20.0–28.0)
Bicarbonate: 28.2 mmol/L — ABNORMAL HIGH (ref 20.0–28.0)
Calcium, Ion: 1.05 mmol/L — ABNORMAL LOW (ref 1.15–1.40)
Calcium, Ion: 1.23 mmol/L (ref 1.15–1.40)
Calcium, Ion: 1.23 mmol/L (ref 1.15–1.40)
HCT: 25 % — ABNORMAL LOW (ref 39.0–52.0)
HCT: 22 % — ABNORMAL LOW (ref 39.0–52.0)
HCT: 23 % — ABNORMAL LOW (ref 39.0–52.0)
Hemoglobin: 8.5 g/dL — ABNORMAL LOW (ref 13.0–17.0)
Hemoglobin: 7.5 g/dL — ABNORMAL LOW (ref 13.0–17.0)
Hemoglobin: 7.8 g/dL — ABNORMAL LOW (ref 13.0–17.0)
O2 Saturation: 96 %
O2 Saturation: 97 %
O2 Saturation: 92 %
Patient temperature: 97.8
Patient temperature: 98.5
Potassium: 4.2 mmol/L (ref 3.5–5.1)
Potassium: 4 mmol/L (ref 3.5–5.1)
Potassium: 4 mmol/L (ref 3.5–5.1)
Sodium: 135 mmol/L (ref 135–145)
Sodium: 135 mmol/L (ref 135–145)
Sodium: 135 mmol/L (ref 135–145)
TCO2: 31 mmol/L (ref 22–32)
TCO2: 32 mmol/L (ref 22–32)
TCO2: 30 mmol/L (ref 22–32)
pCO2 arterial: 61.7 mmHg — ABNORMAL HIGH (ref 32–48)
pCO2 arterial: 53.3 mmHg — ABNORMAL HIGH (ref 32–48)
pCO2 arterial: 59 mmHg — ABNORMAL HIGH (ref 32–48)
pH, Arterial: 7.286 — ABNORMAL LOW (ref 7.35–7.45)
pH, Arterial: 7.361 (ref 7.35–7.45)
pH, Arterial: 7.286 — ABNORMAL LOW (ref 7.35–7.45)
pO2, Arterial: 94 mmHg (ref 83–108)
pO2, Arterial: 94 mmHg (ref 83–108)
pO2, Arterial: 73 mmHg — ABNORMAL LOW (ref 83–108)

## 2023-02-28 LAB — MAGNESIUM: Magnesium: 2.2 mg/dL (ref 1.7–2.4)

## 2023-02-28 LAB — CALCIUM, IONIZED: Calcium, Ionized, Serum: 4.1 mg/dL — ABNORMAL LOW (ref 4.5–5.6)

## 2023-02-28 MED ORDER — PRISMASOL BGK 4/2.5 32-4-2.5 MEQ/L REPLACEMENT SOLN: Status: DC

## 2023-02-28 MED ORDER — MIDODRINE HCL 5 MG PO TABS
30.0000 mg | ORAL_TABLET | Freq: Three times a day (TID) | ORAL | Status: DC
  Administered 2023-03-01 – 2023-03-04 (×10): 30 mg via ORAL
  Filled 2023-02-28 (×10): qty 6

## 2023-02-28 MED ORDER — DEXTROSE 50 % IV SOLN
12.5000 g | Freq: Once | INTRAVENOUS | Status: AC
  Administered 2023-02-28: 12.5 g via INTRAVENOUS
  Filled 2023-02-28: qty 50

## 2023-02-28 MED ORDER — PRISMASOL BGK 4/2.5 32-4-2.5 MEQ/L EC SOLN: Status: DC

## 2023-02-28 MED ORDER — MIDODRINE HCL 5 MG PO TABS
10.0000 mg | ORAL_TABLET | ORAL | Status: AC
  Administered 2023-02-28: 10 mg via ORAL
  Filled 2023-02-28: qty 2

## 2023-02-28 MED ORDER — MIDODRINE HCL 5 MG PO TABS
20.0000 mg | ORAL_TABLET | Freq: Three times a day (TID) | ORAL | Status: DC
  Administered 2023-02-28: 20 mg via ORAL
  Filled 2023-02-28: qty 4

## 2023-02-28 NOTE — Progress Notes (Incomplete)
Nutrition Follow-up  DOCUMENTATION CODES:   Morbid obesity  INTERVENTION:  ***  Tube Feeding via Cortrak:   Continue Renal MVI Add Vitamin D supplementation  NUTRITION DIAGNOSIS:   Inadequate oral intake related to inability to eat as evidenced by NPO status.  ***  GOAL:   Patient will meet greater than or equal to 90% of their needs  ***  MONITOR:   PO intake, Diet advancement, TF tolerance, Weight trends, Labs, Skin  REASON FOR ASSESSMENT:   Ventilator, Consult Assessment of nutrition requirement/status  ASSESSMENT:   36 year old male with PMHx of ESRD on HD, nonischemic cardiomyopathy, refractory A-fib, possible amio lung toxicity admitted with acute on chronic hypoxmic respiratory failure in setting of virus and missed HD, probable sepsis.  Currently on BiPap Levophed down to 2, vasopressin off; pt requiring high doses of levophed and vasopressin over the weekend  Remains on CRRT with citrate protocol  Vital 1.5 currently on hold; previously infusing at 50 ml/hr  Hyperkalemia over the weekend, Lokelma ordered, Bantrol d/c. Noted pt on 4/0 K+ bath d/t citrate with lower K+ bath not available  Vitamin Labs (02/21/23-730/24): CRP: 12.2 (H) Copper: 146 (H) Folate: 12 (wdl) Vitamin C: 1.9 (wdl) Vitamin D: 14.2 (L) Zinc: 132 (H)   NUTRITION - FOCUSED PHYSICAL EXAM:  {RD Focused Exam List:21252}  Diet Order:   Diet Order             DIET DYS 2 Room service appropriate? Yes; Fluid consistency: Nectar Thick  Diet effective now                   EDUCATION NEEDS:   No education needs have been identified at this time  Skin:  Skin Assessment: Skin Integrity Issues: Skin Integrity Issues:: Stage II, Other (Comment) Stage II: coccyx Other: MASD to buttocks with fissure in gluteal fold; non-blanchable dark area on L hip, L flank, back (not presenting like DTIPI, thought possibly related to poor skin/tissue perfusion and pressor use  Last BM:   7/28 noted FMS has been removed  Height:   Ht Readings from Last 1 Encounters:  01/21/23 6' (1.829 m)    Weight:   Wt Readings from Last 1 Encounters:  02/28/23 (!) 180.2 kg    Ideal Body Weight:  80.9 kg  BMI:  Body mass index is 53.88 kg/m.  Estimated Nutritional Needs:   Kcal:  2100-2300 kcals  Protein:  160-190 g  Fluid:  UOP + 1 L   Romelle Starcher MS, RDN, LDN, CNSC Registered Dietitian 3 Clinical Nutrition RD Pager and On-Call Pager Number Located in [place]

## 2023-02-28 NOTE — Progress Notes (Signed)
NAME:  Phillip Young, MRN:  [id number], DOB:  April 20, 1986, LOS: 38 ADMISSION DATE:  01/21/2023, CONSULTATION DATE:  01/22/23 REFERRING MD:  Danise Edge, CHIEF COMPLAINT:  SOB   History of Present Illness:  37 year old man well known to the PCCM and AHF services who presented to [facility] 6/28 with fevers, joint pains, SOB. PMHx significant for ESRD, NICM, refractory AFib, ?amio-induced lung toxicity. Patient reportedly missed HD on Thursday (has a history of leaving HD early). Reportedly ~10kg over dry weight with worsening breathing status, AFib/RVR and borderline BP.  PCCM consulted for ICU admission/further evaluation and management.  Pertinent Medical History:   Past Medical History:  Diagnosis Date   Acute on chronic respiratory failure with hypoxia ([name]) 04/21/2021   Acute on chronic systolic (congestive) heart failure ([name]) 02/26/2020   Amiodarone toxicity    Anemia    Atrial flutter ([name])    Biventricular congestive heart failure ([name])    Chronic hypoxemic respiratory failure ([name])    Class 3 severe obesity due to excess calories with serious comorbidity and body mass index (BMI) of 50.0 to 59.9 in adult [facility]) 02/26/2020   Essential hypertension 02/26/2020   GERD without esophagitis 02/26/2020   Hidradenitis suppurativa 02/26/2020   NICM (nonischemic cardiomyopathy) ([name])    Obesity hypoventilation syndrome ([name])    OSA (obstructive sleep apnea)    PAF (paroxysmal atrial fibrillation) ([name])    Pneumonia    Prediabetes 02/26/2020   Significant Hospital Events: Including procedures, antibiotic start and stop dates in addition to other pertinent events   6/28 Admit, emergent HD 6/29 Afib/RVR, shock, ICU transfer 6/30 Intubated and placed on vasopressor support and started on CRRT 7/1 Remains intubated on CRRT continue with pressor support 7/2 Agitated with SAT, remains on CRRT and low dose levophed 7/3 low dose NE, ongoing CRRT 7/4 Attempted SBT this am, apneic but severely agitated,  desaturated, HR up 130/140s.  Re-sedated but HR remains 140's, NE up from 6 to . NSR on EKG.  Was pulling 50-100on UF, since kept even for now.  CVP 8 7/5 DCCV again attempted this a.m. with success, remains on norepinephrine and vasopressin.  7/6 went into A-fib with RVR, attempted DCCV x 3, started on amiodarone infusion after amiodarone bolus 7/7 Multiple DCCV attempts without success. TF held due to vomiting. Tx denied to [facility]  7/8 KUB looked OK. No further vomiting. Tolerating TF. Large BM overnight.  7/9 vasopressors back up, remains rapid afib/ flutter, Bmx 1, wua causing hemodynamic instability 7/10 weaned sedation on mothers request, HR remains 140-220s, tmax 100.6, DNR, no escalation.  Heart failure signed off, not a candidate for advanced heart failure therapies at this time 7/12 [facility] denied transfer, CODE STATUS changed from DNR back to full code 7/13 Amiodarone restarted with ESR trend  7/16 Amio stopped with no improvement see in rate control  7/20 extubated 7/22 Issues with agitation post extubation, improved with Precedex. Tmax 101, respiratory culture with moderate yeast and fluconazole started 7/23 ID consulted  7/24 ID stopped fluconazole and started Daptomycin for fever of unknown origin  7/25 Intermittent BIPAP need 7/28 Remains on vasopressor support with Levophed and vasopressin. Refractory A-fib with heart rate in 140s to 160s 7/29 CVC dislodged during attempts to ambulate, CRRT stopped and central access utilized for both levo and vaso.  PICC line order placed 7/30 PICC placed overnight, back on CRRT and levophed/.vaso and CRRT 7/31 no changes, Dr. Denese Killings starting Digoxin 8/5 Now off Dig and amiodarone, in sinus rhythm and off  pressors   Interim History / Subjective:   [name] hemodynamics have improved, however he had an episode of unresponsiveness and hypercarbia two days ago Awake today, on bipap, ABG improved    Objective:  Blood pressure 97/61, pulse 95,  temperature (!) 96.5 F (35.8 C), temperature source Axillary, resp. rate (!) 32, height 6' (1.829 m), weight (!) 180.2 kg, SpO2 100%. CVP:  [13 mmHg-24 mmHg] 18 mmHg  Vent Mode: PCV;BIPAP FiO2 (%):  [50 %-90 %] 50 % Set Rate:  [32 bmp] 32 bmp PEEP:  [10 cmH20] 10 cmH20   Intake/Output Summary (Last 24 hours) at 02/28/2023 9604 Last data filed at 02/28/2023 0600 Gross per 24 hour  Intake 1856.11 ml  Output 3564.6 ml  Net -1708.49 ml   Filed Weights   02/26/23 0500 02/27/23 0500 02/28/23 0500  Weight: (!) 181.7 kg (!) 183.4 kg (!) 180.2 kg    General:  obese critically ill-appearing M HEENT: MM pink/moist, bipap in place  Neuro: sleeping but arousable, nodding to questions CV: s1s2 rrr, no m/r/g, HR 90's PULM:  diminished throughout, on bipap 50% GI: soft, non-tender Extremities: warm/dry, 1+ edema  Skin: no rashes or lesions  ABG 7.361/53/94/30 Coox 79%  Resolved Hospital Problem List:  Hematuria due to traumatic Foley insertion Pneumococcal bacteremia Fever of unknown origin  Bilateral multifocal pneumonia Septic and cardiogenic shock  Assessment & Plan:    Acute-on-chronic hypoxemic/hypercapnic respiratory failure OHS/OSA Acute metabolic encephalopathy in the setting of hypercapnia Noncompliant with CPAP at baseline Initially strep and superimposed HCAP -episode of unresponsiveness and respiratory acidosis improved with bipap -trial off today, repeat CXR read pending, does not appear markedly worse from prior, has completed abx, will not resume today as he is afebrile without leukocytosis     Acute on chronic HFrEF with pulmonary edema Persistent A-fib/flutter with RVR Nonischemic cardiomyopathy EF less than 20% Mixed shock septic/cardiogenic Now in predominantly sinus rhythm and off pressors  Many conversations regarding limited therapeutic options and HF team signed off 7/10, mother and patient feel that he can improve and want him transferred to another  facility, no accepting facility found at [facility] or [facility] -coox stable at 79% -On high-dose midodrine -on eliquis, hold as consulting IR for tunneled catheter     End-stage renal disease on hemodialysis Recurrent metabolic acidosis, improving Metabolic bone disease  Hyper/Hypocalcemia  Nephrology following, appreciate assistance -remains on CRRT though temporary catheter -now that pt is more stable will consult IR for tunneled catheter  Hypervolemic hyponatremia Hyperphosphatemia Hyperkalemia  Hypocalcemia -Closely monitor electrolytes  Anemia of chronic disease -Monitor H&H and transfuse if less than 7  Skin breakdown, not POA -Perineum, pt refusing rectal tube -Continue local wound care   Best Practice (right click and "Reselect all SmartList Selections" daily)   Diet/type: TF DVT prophylaxis: DOAC, holding today GI prophylaxis: Famotidine Lines: Yes still needed Foley: n/a Code Status:  Full Last date of multidisciplinary goals of care: 7/30 continue full code and full scope of care  Will update mother 8/5   CRITICAL CARE Performed by: Darcella Gasman    Total critical care time: 35 minutes  Critical care time was exclusive of separately billable procedures and treating other patients.  Critical care was necessary to treat or prevent imminent or life-threatening deterioration.  Critical care was time spent personally by me on the following activities: development of treatment plan with patient and/or surrogate as well as nursing, discussions with consultants, evaluation of patient's response to treatment, examination of patient, obtaining history  from patient or surrogate, ordering and performing treatments and interventions, ordering and review of laboratory studies, ordering and review of radiographic studies, pulse oximetry and re-evaluation of patient's condition.  Darcella Gasman , PA-C [facility] See Amion for pager If no response to  pager , please call [phone] until 7pm After 7:00 pm call Elink  [phone]

## 2023-02-28 NOTE — TOC Progression Note (Signed)
Transition of Care [facility]) - Progression Note    Patient Details  Name: Phillip Young MRN: [id number] Date of Birth: May 14, 1986  [facility] CM/SW Contact  Elliot Cousin, RN Phone Number:  [phone] 02/28/2023, 6:19 PM  Clinical Narrative:   CM spoke to pt at bedside. States he wants to get better. Mother at bedside and hoping pt can go to IP rehab once medically stable. Pt educated on importance of wearing Bipap as ordered. He agreed to wearing Bipap. Will continue to follow for dc needs.     Expected Discharge Plan: Home/Self Care Barriers to Discharge: Continued Medical Work up  Expected Discharge Plan and Services   Discharge Planning Services: CM Consult   Living arrangements for the past 2 months: Single Family Home                                       Social Determinants of Health (SDOH) Interventions SDOH Screenings   Food Insecurity: No Food Insecurity (01/22/2023)  Housing: Low Risk  (01/22/2023)  Transportation Needs: No Transportation Needs (01/22/2023)  Utilities: Not At Risk (01/22/2023)  Alcohol Screen: Low Risk  (09/21/2022)  Depression (PHQ2-9): High Risk (09/21/2022)  Financial Resource Strain: High Risk (09/21/2022)  Physical Activity: Inactive (09/21/2022)  Social Connections: Unknown (09/21/2022)  Stress: No Stress Concern Present (09/21/2022)  Tobacco Use: Medium Risk (01/21/2023)    Readmission Risk Interventions    06/09/2022   12:19 PM 05/12/2021   10:35 AM 10/16/2020    2:10 PM  Readmission Risk Prevention Plan  Transportation Screening Complete Complete Complete  PCP or Specialist Appt within 5-7 Days   Complete  Home Care Screening   Complete  Medication Review (RN CM)   Referral to Pharmacy  Medication Review (RN Care Manager) Complete Complete   PCP or Specialist appointment within 3-5 days of discharge Complete Complete   HRI or Home Care Consult Complete Complete   SW Recovery Care/Counseling Consult Complete  Complete   Palliative Care Screening Not Applicable Complete   Skilled Nursing Facility Not Applicable Complete

## 2023-02-28 NOTE — Progress Notes (Signed)
Interventional Radiology Brief Note:  Patient with poorly functioning tunneled HD catheter.  IR consulted for tunneled dialysis catheter exchange, however patient was unable to travel to IR due to instability and a left internal jugular temp cath was placed by CCM.  Patient will ultimately need new tunneled dialysis catheter.  IR APPs have spoken with mother who requests EXCHANGE ONLY.    Consent for this has been obtained and is in IR.    Will continue to work towards exchange.  Does not need to be NPO for exchange, will plan to proceed with local anesthetic.   Loyce Dys, MS RD PA-C

## 2023-02-28 NOTE — Plan of Care (Signed)
  Problem: Clinical Measurements: Goal: Ability to maintain clinical measurements within normal limits will improve Outcome: Progressing Goal: Will remain free from infection Outcome: Progressing Goal: Diagnostic test results will improve Outcome: Progressing Goal: Respiratory complications will improve Outcome: Progressing Goal: Cardiovascular complication will be avoided Outcome: Progressing   Problem: Elimination: Goal: Will not experience complications related to bowel motility Outcome: Progressing   Problem: Pain Managment: Goal: General experience of comfort will improve Outcome: Progressing   Problem: Skin Integrity: Goal: Risk for impaired skin integrity will decrease Outcome: Progressing

## 2023-02-28 NOTE — Progress Notes (Signed)
Pt placed on BIPAP to rest per order. Pt tolerating well

## 2023-02-28 NOTE — Progress Notes (Addendum)
[facility] NEPHROLOGY PROGRESS NOTE  Subjective:     Objective Vital signs in last 24 hours: Vitals:   02/14/23 1600 02/14/23 1615 02/14/23 1630 02/14/23 1645  BP:      Pulse:      Resp: (!) 43 (!) 38 (!) 35 (!) 31  Temp: 98.1 F (36.7 C)     TempSrc: Axillary     SpO2: 100%     Weight:      Height:         Physical Exam: chronically ill looking male, [place] O2 Heart:RRR, s1s2 nl Lungs:coarse breath sound b/l Abdomen: soft, +bs Extremities: 1+ bilat hip edema  Dialysis Access: RIJ TDC intact, LIJ temp HD catheter running crrt   OP HD: [facility] TTS  4h   180kg  425/1.5  TDC  Na citrate cath lock (no heparin) - last OP HD was 6/25, post wt 193.8kg - consistently left HD 8-12kg up at least the prior 3 wks - sensipar 60mg  three times per week - hectorol 4 mcg IV three times per week - mircera 200 mcg  IV q 2 wks, last 6/13, due 6/27    Assessment/ Plan:  # Hypotension - no longer pressor dependent, came off pressors on around 8/03- 8/04. Still on midodrine 30 tid, will taper down.   # Septic shock/ pneumococcal bacteremia: complex course, ID following, has defervesced. Last blood culture 7/23 negative.  Off abx now.   # Acute hypoxic and hypercapnic respiratory failure: patient is now extubated. Stable on HFNC  # ESRD - usual HD is TTS. Required emergent HD on 6/29 because of fluid overload.  Then developed shock and afib/ RVR requiring high doses of pressors.  Started CRRT on 6/29. Cont CRRT given pressor dependence at this time. 8/2 required temp HD catheter placement after [facility] malfunctioned and was unstable to go to IR for exchange over wire.  Plan would be TDC later this week if he is stable.  For now cont to use temp LIJ line.   # Anemia of CKD:  on max ESA with variable Hbs, generally in the 9s.   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 Have been holding vdra due to high Ca++'s. May reintroduce soon.   # Hypercalcemia - resolved and pt is now off citrate  protocol  # Volume - not edematous today. CVP 16. Wt's up at 180kg and last CXR 8/02 looks wet though. Pulling 50 cc/hr now, will ^ to 100 cc/ hr. Cont CRRT for volume removal at this time.   # CRRT anticoagulation - pt required change to citrate protocol for frequent clotting started 7/06.  Then we ran out of supplies and citrate protocol was dc'd yesterday 8/04.  Intolerance to heparin so avoiding this.   # Acute on chronic systolic CHF, nonischemic: EF 19-14%.  #A-fib with RVR with hypotension: S/P cardioversion 7/5 -> NSR -> afib RVR 7/6 early AM refractory to [facility] x3 + amiodarone. Persistent afib/flutter w/ HR > 100  #hyperkalemia: banana flakes stopped, better    Vinson Moselle  MD  [facility] 02/28/2023, 12:05 PM  Recent Labs  Lab 02/27/23 1603 02/27/23 1956 02/28/23 0355 02/28/23 0426 02/28/23 0432 02/28/23 0449 02/28/23 0608 02/28/23 0957  HGB  --    < > 7.7*   < > 8.8* 8.2* 7.4* 7.5*  ALBUMIN 2.4*  --  2.5*  --   --   --   --   --   CALCIUM 9.9  --  10.2  --   --   --   --   --  PHOS 4.8*  --  4.4  --   --   --   --   --   CREATININE 2.16*   < > 2.18*  --  1.80* 2.20*  --   --   K 4.7   < > 4.1   < > 4.0 3.9  --  4.0   < > = values in this interval not displayed.    Inpatient medications:  Chlorhexidine Gluconate Cloth  6 each Topical Q0600   darbepoetin (ARANESP) injection - DIALYSIS  200 mcg Subcutaneous Q Sat-1800   famotidine  20 mg Per Tube Daily   feeding supplement (PROSource TF20)  60 mL Per Tube Q4H   insulin aspart  0-9 Units Subcutaneous Q4H   lidocaine  1 patch Transdermal Q24H   melatonin  5 mg Per Tube QHS   midodrine  30 mg Oral TID WC   multivitamin  1 tablet Per Tube QHS   nutrition supplement (JUVEN)  1 packet Per Tube BID BM   mouth rinse  15 mL Mouth Rinse 4 times per day   sodium chloride flush  10-40 mL Intravenous Q12H     prismasol BGK 4/2.5 400 mL/hr at 02/28/23 0821    prismasol BGK 4/2.5 400 mL/hr at 02/28/23 4098   sodium chloride 10  mL/hr at 02/21/23 1444   anticoagulant sodium citrate     feeding supplement (VITAL 1.5 CAL) Stopped (02/27/23 0001)   norepinephrine (LEVOPHED) Adult infusion Stopped (02/28/23 0648)   prismasol BGK 4/2.5 1,500 mL/hr at 02/28/23 1203   vasopressin Stopped (02/27/23 0405)   sodium chloride, acetaminophen **OR** acetaminophen, anticoagulant sodium citrate, bisacodyl, docusate, ondansetron (ZOFRAN) IV, mouth rinse, oxyCODONE, polyethylene glycol, polyvinyl alcohol, senna-docusate, sodium chloride flush, surgical lubricant

## 2023-02-28 NOTE — Progress Notes (Signed)
SLP Cancellation Note  Patient Details Name: Phillip Young MRN: [id number] DOB: 1986/03/14   Cancelled treatment:        Pt on BiPAP at present and not medically ready for POs or FEES.  Pt with unresponsive event over weekend, but improving. RN reports pt to hopefully transition out of ICU to floor if he can tolerate HD instead of CRRT and remain of pressors.  SLP will plan to follow up Thursday, if pt has any ST needs before then, please reach out to [facility] SLP group via secure chat, or acute rehab dept at [phone]   Kerrie Pleasure, MA, CCC-SLP Acute Rehabilitation Services Office: [phone] 02/28/2023, 10:01 AM

## 2023-02-28 NOTE — Progress Notes (Addendum)
2300: Foaming detected in CRRT de-aeration chamber. Dialysis stopped and blood returned to the patient, calcium gluconate & citrate dextrose paused. Left internal jugular hemodialysis catheter(s) flushed and filled with anticoagulant sodium citrate during filter/set exchange. VSS.   0000: CVVHDF resumed with no further complications using new filter/set and prior solutions and settings. Calcium gluconate & citrate dextrose restarted. No further complications, total downtime approximately 1 hour.   0600: Return extremely positive warning during patient turn, citrate gtt connected to Y-tubing on the venous access line backed up into the circuit. CRRT paused and filter/set discarded, unable to return patient blood. Calcium gluconate & citrate dextrose stopped and discontinued per order. Stat CBC sent off to lab for Hgb verification. Left internal jugular hemodialysis catheter(s) flushed and filled with anticoagulant sodium citrate. VSS.

## 2023-02-28 NOTE — Progress Notes (Signed)
Physical Therapy Treatment Patient Details Name: Phillip Young MRN: [id number] DOB: 09-26-1985 Today's Date: 02/28/2023   History of Present Illness Pt is a 37 y/o M presenting to ED on 6/28 with fever, joint pain, and SOB after missing HD. Admitted for acute hypoxic respiratory failure. Intubated and started on CRRT, vasopressor support on 6/30. Extubated 7/20. Underwent DCCV 7/5, 7/6, and 7/7 without success. PMH includes ESRD, NICM, refractory A fib, HFrEF    PT Comments  Pt continues to be on CRRT and pressors. Pt extremely debilitated however tolerated tilting up to 45 deg in Kreg tilt bed this date x 10 min. Attempted to complete knee extensions in standing however unable. Pt with noted R UE weakness more than L, especially proximally. Pt remains dependent to roll L/R for dressing changes on bottom. Acute PT to cont to follow.    If plan is discharge home, recommend the following: Two people to help with walking and/or transfers;Two people to help with bathing/dressing/bathroom;Direct supervision/assist for medications management;Direct supervision/assist for financial management;Assist for transportation;Help with stairs or ramp for entrance   Can travel by private vehicle     No  Equipment Recommendations   (TBD at next venue)    Recommendations for Other Services       Precautions / Restrictions Precautions Precautions: Fall Precaution Comments: CRRT, NGT, multiple lines, wounds on hip/back/buttocks Restrictions Weight Bearing Restrictions: No     Mobility  Bed Mobility Overal bed mobility: Needs Assistance Bed Mobility: Rolling Rolling: Max assist, +2 for physical assistance (rolling to L and R for dressing changes to buttocks)              Transfers                   General transfer comment: used tilt bed up to 45 deg, placing 88kg through his feet    Ambulation/Gait               General Gait Details: unable this date   Stairs              Wheelchair Mobility     Tilt Bed    Modified Rankin (Stroke Patients Only)       Balance Overall balance assessment: Needs assistance Sitting-balance support: Feet supported Sitting balance-Leahy Scale: Zero Sitting balance - Comments: unable to sit without support posteriorly       Standing balance comment: pt able to stand in tilt bed x 10 min, attempted to complete knee extension however pt only able to initiate 3 times. worked on reaching with Bilat UEs across body in standing                            Cognition Arousal/Alertness: Awake/alert Behavior During Therapy: Flat affect Overall Cognitive Status: No family/caregiver present to determine baseline cognitive functioning                                 General Comments: pt A&O x4 however irrational with decreased insight to safety and deficits. Pt with poor understanding of his medical condition and the treatment he is receiving. delayed response time and processing        Exercises      General Comments General comments (skin integrity, edema, etc.): HR around 100bpm, BP stable, SpO2 >92%      Pertinent Vitals/Pain Pain Assessment Pain Assessment: Faces Faces Pain Scale:  Hurts even more Pain Location: buttocks ,back Pain Descriptors / Indicators: Discomfort, Aching    Home Living                          Prior Function            PT Goals (current goals can now be found in the care plan section) Acute Rehab PT Goals Patient Stated Goal: stand up and walk PT Goal Formulation: With patient Time For Goal Achievement: 03/07/23 Potential to Achieve Goals: Fair Progress towards PT goals: Progressing toward goals    Frequency    Min 1X/week      PT Plan Current plan remains appropriate    Co-evaluation              AM-PAC PT "6 Clicks" Mobility   Outcome Measure  Help needed turning from your back to your side while in a flat bed without using  bedrails?: Total Help needed moving from lying on your back to sitting on the side of a flat bed without using bedrails?: Total Help needed moving to and from a bed to a chair (including a wheelchair)?: Total Help needed standing up from a chair using your arms (e.g., wheelchair or bedside chair)?: Total Help needed to walk in hospital room?: Total Help needed climbing 3-5 steps with a railing? : Total 6 Click Score: 6    End of Session Equipment Utilized During Treatment: Oxygen Activity Tolerance: Patient tolerated treatment well Patient left: in bed;with call bell/phone within reach;with nursing/sitter in room Nurse Communication: Mobility status (RN present) PT Visit Diagnosis: Unsteadiness on feet (R26.81)     Time: 1610-9604 PT Time Calculation (min) (ACUTE ONLY): 51 min  Charges:    $Therapeutic Exercise: 8-22 mins $Therapeutic Activity: 23-37 mins PT General Charges $$ ACUTE PT VISIT: 1 Visit                     Phillip Young, PT, DPT Acute Rehabilitation Services Secure chat preferred Office #: [phone]    Iona Hansen 02/28/2023, 2:30 PM

## 2023-03-01 ENCOUNTER — Inpatient Hospital Stay (HOSPITAL_COMMUNITY): Payer: BC Managed Care – PPO

## 2023-03-01 DIAGNOSIS — J9601 Acute respiratory failure with hypoxia: Secondary | ICD-10-CM | POA: Diagnosis not present

## 2023-03-01 DIAGNOSIS — R570 Cardiogenic shock: Secondary | ICD-10-CM | POA: Diagnosis not present

## 2023-03-01 HISTORY — PX: IR FLUORO GUIDE CV LINE RIGHT: IMG2283

## 2023-03-01 LAB — POCT I-STAT 7, (LYTES, BLD GAS, ICA,H+H)
Acid-Base Excess: 1 mmol/L (ref 0.0–2.0)
Bicarbonate: 26.9 mmol/L (ref 20.0–28.0)
Calcium, Ion: 1.24 mmol/L (ref 1.15–1.40)
HCT: 27 % — ABNORMAL LOW (ref 39.0–52.0)
Hemoglobin: 9.2 g/dL — ABNORMAL LOW (ref 13.0–17.0)
O2 Saturation: 99 %
Potassium: 3.8 mmol/L (ref 3.5–5.1)
Sodium: 135 mmol/L (ref 135–145)
TCO2: 28 mmol/L (ref 22–32)
pCO2 arterial: 46.3 mmHg (ref 32–48)
pH, Arterial: 7.372 (ref 7.35–7.45)
pO2, Arterial: 128 mmHg — ABNORMAL HIGH (ref 83–108)

## 2023-03-01 LAB — COOXEMETRY PANEL
Carboxyhemoglobin: 2.6 % — ABNORMAL HIGH (ref 0.5–1.5)
Methemoglobin: <0.7 % (ref 0.0–1.5)
O2 Saturation: 79.5 %
Total hemoglobin: 8.1 g/dL — ABNORMAL LOW (ref 12.0–16.0)

## 2023-03-01 LAB — GLUCOSE, CAPILLARY
Glucose-Capillary: 100 mg/dL — ABNORMAL HIGH (ref 70–99)
Glucose-Capillary: 57 mg/dL — ABNORMAL LOW (ref 70–99)
Glucose-Capillary: 71 mg/dL (ref 70–99)
Glucose-Capillary: 85 mg/dL (ref 70–99)
Glucose-Capillary: 89 mg/dL (ref 70–99)
Glucose-Capillary: 90 mg/dL (ref 70–99)

## 2023-03-01 MED ORDER — VANCOMYCIN HCL IN DEXTROSE 1-5 GM/200ML-% IV SOLN
1000.0000 mg | INTRAVENOUS | Status: AC
  Administered 2023-03-01: 1000 mg via INTRAVENOUS

## 2023-03-01 MED ORDER — LIDOCAINE-EPINEPHRINE 2 %-1:100000 IJ SOLN
20.0000 mL | Freq: Once | INTRAMUSCULAR | Status: AC
  Administered 2023-03-01: 10 mL via INTRADERMAL

## 2023-03-01 MED ORDER — GUAIFENESIN-DM 100-10 MG/5ML PO SYRP: 5.0000 mL | ORAL_SOLUTION | ORAL | Status: DC | PRN

## 2023-03-01 MED ORDER — HEPARIN SODIUM (PORCINE) 1000 UNIT/ML IJ SOLN
INTRAMUSCULAR | Status: AC
  Filled 2023-03-01: qty 10

## 2023-03-01 MED ORDER — LIDOCAINE-EPINEPHRINE 1 %-1:100000 IJ SOLN
INTRAMUSCULAR | Status: AC
  Filled 2023-03-01: qty 1

## 2023-03-01 MED ORDER — DOXERCALCIFEROL 4 MCG/2ML IV SOLN
2.0000 ug | INTRAVENOUS | Status: DC
  Administered 2023-03-01 – 2023-03-12 (×6): 2 ug via INTRAVENOUS
  Filled 2023-03-01 (×10): qty 2

## 2023-03-01 MED ORDER — IOHEXOL 300 MG/ML  SOLN: 50.0000 mL | Freq: Once | INTRAMUSCULAR | Status: DC | PRN

## 2023-03-01 MED ORDER — NEPRO/CARBSTEADY PO LIQD
237.0000 mL | Freq: Three times a day (TID) | ORAL | Status: DC
  Administered 2023-03-01 – 2023-03-03 (×7): 237 mL via ORAL

## 2023-03-01 MED ORDER — CHLORHEXIDINE GLUCONATE 4 % EX SOLN
CUTANEOUS | Status: AC
  Filled 2023-03-01: qty 15

## 2023-03-01 MED ORDER — VANCOMYCIN HCL IN DEXTROSE 1-5 GM/200ML-% IV SOLN
INTRAVENOUS | Status: AC
  Filled 2023-03-01: qty 200

## 2023-03-01 MED ORDER — VITAMIN D 25 MCG (1000 UNIT) PO TABS
1000.0000 [IU] | ORAL_TABLET | Freq: Every day | ORAL | Status: DC
  Administered 2023-03-01 – 2023-03-05 (×5): 1000 [IU] via ORAL
  Filled 2023-03-01 (×5): qty 1

## 2023-03-01 MED ORDER — DEXTROSE 50 % IV SOLN
1.0000 | Freq: Once | INTRAVENOUS | Status: AC
  Administered 2023-03-01: 50 mL via INTRAVENOUS
  Filled 2023-03-01: qty 50

## 2023-03-01 MED ORDER — ENSURE ENLIVE PO LIQD: 237.0000 mL | Freq: Three times a day (TID) | ORAL | Status: DC

## 2023-03-01 MED ORDER — HYDROCORTISONE 1 % EX CREA
1.0000 | TOPICAL_CREAM | Freq: Three times a day (TID) | CUTANEOUS | Status: DC | PRN
  Administered 2023-03-01 – 2023-03-03 (×2): 1 via TOPICAL
  Filled 2023-03-01 (×3): qty 28

## 2023-03-01 NOTE — Progress Notes (Signed)
[facility] PROGRESS NOTE  Subjective:  on HFNC 6 lpm today which is improving. I/O yest net negative 1.5 L. Started on low dose pressors overnight.    Objective Vital signs in last 24 hours: Vitals:   02/14/23 1600 02/14/23 1615 02/14/23 1630 02/14/23 1645  BP:      Pulse:      Resp: (!) 43 (!) 38 (!) 35 (!) 31  Temp: 98.1 F (36.7 C)     TempSrc: Axillary     SpO2: 100%     Weight:      Height:         Physical Exam: chronically ill looking male, [place] O[place] Heart:RRR, s1s2 nl Lungs:coarse breath sound b/l Abdomen: soft, +bs Extremities: 1+ bilat hip edema  Dialysis Access: RIJ TDC intact, LIJ temp HD catheter running crrt   OP HD: [facility] TTS  4h   180kg  425/1.5  TDC  Na citrate cath lock (no heparin) - last OP HD was 6/25, post wt 193.8kg - consistently left HD 8-12kg up at least the prior 3 wks - sensipar 60mg  three times per week - hectorol 4 mcg IV three times per week - mircera 200 mcg  IV q 2 wks, last 6/13, due 6/27    Assessment/ Plan:  # Hypotension - no longer pressor dependent, came off pressors on around 8/03- 8/04. Still on midodrine 30 tid, will taper down.   # Pneumococcal bacteremia: w/ septic shock (at time of admission). Complex course. Last blood culture 7/23 negative.  Off abx now.   # Acute hypoxic and hypercapnic respiratory failure: extubated on 7/20.  Stable on HFNC now.   # ESRD - usual HD is TTS. Required emergent HD on 6/29 because of fluid overload.  Then developed shock and afib/ RVR requiring high doses of pressors.  Started CRRT on 6/29. Cont CRRT given pressor dependence at this time. 8/2 required temp HD catheter placement after [facility] malfunctioned and was unstable to go to IR for exchange over wire.   For now cont to use temp LIJ line and will consult IR for new TDC.   # Anemia of CKD:  on max ESA with variable Hbs, generally in the 9s.   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 Will resume IV hectorol (half dose)  since Ca++ levels are back to normal.   # Hypercalcemia - resolved and pt is now off citrate protocol  # Volume - not edematous but CVP 16 and last CXR 8/02 looked wet. Increased UF to 100 cc/ hr yesterday. Wt's down 178. Cont CRRT for volume removal at this time.   # CRRT anticoagulation - pt required change to citrate protocol for frequent clotting started 7/06.  Then we ran out of supplies and citrate protocol was dc'd 8/04.  Intolerance to heparin so avoiding this.   # Acute on chronic systolic CHF, nonischemic: EF 46-96%.  #A-fib with RVR with hypotension: S/P cardioversion 7/5 -> NSR -> afib RVR 7/6 early AM refractory to [facility] x3 + amiodarone. Persistent afib/flutter w/ HR > 100   Rob   MD  [facility] 03/01/2023, 10:00 AM  Recent Labs  Lab 02/28/23 1610 03/01/23 0304 03/01/23 0327  HGB  --  7.6* 9.2*  ALBUMIN 2.4* 2.5*  --   CALCIUM 9.8 9.9  --   PHOS 3.5 2.3*  --   CREATININE 2.26* 2.00*  --   K 4.0 3.8 3.8    Inpatient medications:  Chlorhexidine Gluconate Cloth  6 each Topical Q0600   darbepoetin (ARANESP) injection - DIALYSIS  200 mcg Subcutaneous Q Sat-1800   famotidine  20 mg Per Tube Daily   feeding supplement (PROSource TF20)  60 mL Per Tube Q4H   insulin aspart  0-9 Units Subcutaneous Q4H   lidocaine  1 patch Transdermal Q24H   melatonin  5 mg Per Tube QHS   midodrine  30 mg Oral TID WC   multivitamin  1 tablet Per Tube QHS   nutrition supplement (JUVEN)  1 packet Per Tube BID BM   mouth rinse  15 mL Mouth Rinse 4 times per day   sodium chloride flush  10-40 mL Intravenous Q12H     prismasol BGK 4/2.5 400 mL/hr at 03/01/23 0910    prismasol BGK 4/2.5 400 mL/hr at 03/01/23 0757   sodium chloride 10 mL/hr at 02/21/23 1444   anticoagulant sodium citrate     feeding supplement (VITAL 1.5 CAL) Stopped (02/27/23 0001)   norepinephrine (LEVOPHED) Adult infusion 1 mcg/min (03/01/23 0900)   prismasol BGK 4/2.5 1,500 mL/hr at 03/01/23 0756   sodium chloride,  acetaminophen **OR** acetaminophen, anticoagulant sodium citrate, bisacodyl, docusate, guaiFENesin-dextromethorphan, hydrocortisone cream, ondansetron (ZOFRAN) IV, mouth rinse, oxyCODONE, polyethylene glycol, polyvinyl alcohol, senna-docusate, sodium chloride flush, surgical lubricant

## 2023-03-01 NOTE — Procedures (Signed)
Interventional Radiology Procedure:   Indications: Non-functioning tunneled dialysis catheter  Procedure: Exchange of right jugular tunneled dialysis catheter  Findings: New 23 cm Palindrome catheter placed, tip in right atrium.   Complications: None     EBL: Minimal  Plan: Tunneled dialysis catheter is ready to be used.    R. Lowella Dandy, MD  Pager: [phone]

## 2023-03-01 NOTE — Inpatient Diabetes Management (Signed)
Inpatient Diabetes Program Recommendations  AACE/ADA: New Consensus Statement on Inpatient Glycemic Control (2015)  Target Ranges:  Prepandial:   less than 140 mg/dL      Peak postprandial:   less than 180 mg/dL (1-2 hours)      Critically ill patients:  140 - 180 mg/dL   Lab Results  Component Value Date   GLUCAP 71 03/01/2023   HGBA1C 5.6 11/05/2022    Review of Glycemic Control  Latest Reference Range & Units 02/28/23 19:49 02/28/23 20:09 02/28/23 23:03 02/28/23 23:57 03/01/23 03:24 03/01/23 07:28  Glucose-Capillary 70 - 99 mg/dL 69 (L) 80 61 (L) 82 57 (L) 71  (L): Data is abnormally low  Diabetes history: none/ESRD/HD  Current orders for Inpatient glycemic control: Novolog 0-9 units Q4H  Inpatient Diabetes Program Recommendations:    Glucose trends running low.  If tube feeds have been discontinued, might consider adding Dextrose 10% at 40/hr until eating well and glucose is stable.    Will continue to follow while inpatient.  Thank you, Dulce Sellar, MSN, CDCES Diabetes Coordinator Inpatient Diabetes Program [phone] (team pager from 8a-5p)

## 2023-03-01 NOTE — Progress Notes (Signed)
NAME:  Phillip Young, MRN:  [id number], DOB:  [birth year], LOS: 39 ADMISSION DATE:  01/21/2023, CONSULTATION DATE:  01/22/23 REFERRING MD:  Danise Edge, CHIEF COMPLAINT:  SOB   History of Present Illness:  37 year old man well known to the PCCM and AHF services who presented to [facility] 6/28 with fevers, joint pains, SOB. PMHx significant for ESRD, NICM, refractory AFib, ?amio-induced lung toxicity. Patient reportedly missed HD on Thursday (has a history of leaving HD early). Reportedly ~10kg over dry weight with worsening breathing status, AFib/RVR and borderline BP.  PCCM consulted for ICU admission/further evaluation and management.  Pertinent Medical History:   Past Medical History:  Diagnosis Date   Acute on chronic respiratory failure with hypoxia ([name]) 04/21/2021   Acute on chronic systolic (congestive) heart failure ([name]) 02/26/2020   Amiodarone toxicity    Anemia    Atrial flutter ([name])    Biventricular congestive heart failure ([name])    Chronic hypoxemic respiratory failure ([name])    Class 3 severe obesity due to excess calories with serious comorbidity and body mass index (BMI) of 50.0 to 59.9 in adult [facility]) 02/26/2020   Essential hypertension 02/26/2020   GERD without esophagitis 02/26/2020   Hidradenitis suppurativa 02/26/2020   NICM (nonischemic cardiomyopathy) ([name])    Obesity hypoventilation syndrome ([name])    OSA (obstructive sleep apnea)    PAF (paroxysmal atrial fibrillation) ([name])    Pneumonia    Prediabetes 02/26/2020   Significant Hospital Events: Including procedures, antibiotic start and stop dates in addition to other pertinent events   6/28 Admit, emergent HD 6/29 Afib/RVR, shock, ICU transfer 6/30 Intubated and placed on vasopressor support and started on CRRT 7/1 Remains intubated on CRRT continue with pressor support 7/2 Agitated with SAT, remains on CRRT and low dose levophed 7/3 low dose NE, ongoing CRRT 7/4 Attempted SBT this am, apneic but severely agitated,  desaturated, HR up 130/140s.  Re-sedated but HR remains 140's, NE up from 6 to . NSR on EKG.  Was pulling 50-100on UF, since kept even for now.  CVP 8 7/5 DCCV again attempted this a.m. with success, remains on norepinephrine and vasopressin.  7/6 went into A-fib with RVR, attempted DCCV x 3, started on amiodarone infusion after amiodarone bolus 7/7 Multiple DCCV attempts without success. TF held due to vomiting. Tx denied to [facility]  7/8 KUB looked OK. No further vomiting. Tolerating TF. Large BM overnight.  7/9 vasopressors back up, remains rapid afib/ flutter, Bmx 1, wua causing hemodynamic instability 7/10 weaned sedation on mothers request, HR remains 140-220s, tmax 100.6, DNR, no escalation.  Heart failure signed off, not a candidate for advanced heart failure therapies at this time 7/12 [facility] denied transfer, CODE STATUS changed from DNR back to full code 7/13 Amiodarone restarted with ESR trend  7/16 Amio stopped with no improvement see in rate control  7/20 extubated 7/22 Issues with agitation post extubation, improved with Precedex. Tmax 101, respiratory culture with moderate yeast and fluconazole started 7/23 ID consulted  7/24 ID stopped fluconazole and started Daptomycin for fever of unknown origin  7/25 Intermittent BIPAP need 7/28 Remains on vasopressor support with Levophed and vasopressin. Refractory A-fib with heart rate in 140s to 160s 7/29 CVC dislodged during attempts to ambulate, CRRT stopped and central access utilized for both levo and vaso.  PICC line order placed 7/30 PICC placed overnight, back on CRRT and levophed/.vaso and CRRT 7/31 no changes, Dr. Denese Killings starting Digoxin 8/5 Now off Dig and amiodarone, in sinus rhythm and off  pressors  8/6 back on low dose levophed   Interim History / Subjective:   Respiratory status is better today, on [place], more alert Was back on levophed yesterday evening, on today Pt says he feels better today  Objective:  Blood  pressure (!) 111/52, pulse 99, temperature 97.7 F (36.5 C), temperature source Axillary, resp. rate 15, height 6' (1.829 m), weight (!) 178.8 kg, SpO2 96%. CVP:  [3 mmHg-35 mmHg] 8 mmHg  Vent Mode: BIPAP;PCV FiO2 (%):  [40 %] 40 % Set Rate:  [32 bmp] 32 bmp PEEP:  [10 cmH20] 10 cmH20   Intake/Output Summary (Last 24 hours) at 03/01/2023 1058 Last data filed at 03/01/2023 1030 Gross per 24 hour  Intake 2027.11 ml  Output 3979.6 ml  Net -1952.49 ml   Filed Weights   02/27/23 0500 02/28/23 0500 03/01/23 0500  Weight: (!) 183.4 kg (!) 180.2 kg (!) 178.8 kg     General:  obese, ill-appearing M resting in bed in no acute distress HEENT: MM pink/moist, sclera anicteric Neuro: alert and oriented CV: s1s2 rrr, no m/r/g PULM:  diminished throughout, R tunneled HD catheter GI: soft, obese, non-tender  Extremities: warm/dry, 1+ edema  Skin: no rashes or lesions  Coox 79% Hgb 8.1  Resolved Hospital Problem List:  Hematuria due to traumatic Foley insertion Pneumococcal bacteremia Fever of unknown origin  Bilateral multifocal pneumonia Septic and cardiogenic shock  Assessment & Plan:    Acute-on-chronic hypoxemic/hypercapnic respiratory failure OHS/OSA Acute metabolic encephalopathy in the setting of hypercapnia Noncompliant with CPAP at baseline Initially strep and superimposed HCAP -Wore bipap some overnight, on [place] today without distress -continue bipap at bedtime, mobilization as able and fluid removal with dialysis     Acute on chronic HFrEF with pulmonary edema Persistent A-fib/flutter with RVR Nonischemic cardiomyopathy EF less than 20% Mixed shock septic/cardiogenic Now in predominantly sinus rhythm and off pressors  Many conversations regarding limited therapeutic options and HF team signed off 7/10, mother and patient feel that he can improve and want him transferred to another facility, no accepting facility found at [facility] or [facility] -coox stable at  79% -intermittently requiring low dose levophed -On high-dose midodrine, nephrology to start weaning today -on eliquis, hold as IR to replace tunneled HD catheter -remains in sinus rhythm    End-stage renal disease on hemodialysis Recurrent metabolic acidosis, improving Metabolic bone disease  Hyper/Hypocalcemia  Nephrology following, appreciate assistance -remains on CRRT though temporary catheter -now that pt is more stable IR consulted for tunneled catheter, mother only wants the current one replaced  -on UF 100cc/hr  Hypervolemic hyponatremia Hyperphosphatemia Hyperkalemia  Hypocalcemia -Closely monitor electrolytes  Anemia of chronic disease -Monitor H&H and transfuse if less than 7  Skin breakdown, not POA -Perineum, pt refusing rectal tube -Continue local wound care   Best Practice (right click and "Reselect all SmartList Selections" daily)   Diet/type: TF DVT prophylaxis: DOAC, holding today GI prophylaxis: Famotidine Lines: Yes still needed Foley: n/a Code Status:  Full Last date of multidisciplinary goals of care: 8/5 with patient and mother,  continue full code and full scope of care, would want repeat intubation if absolutely needed   CRITICAL CARE Performed by: Darcella Gasman    Total critical care time: 32 minutes  Critical care time was exclusive of separately billable procedures and treating other patients.  Critical care was necessary to treat or prevent imminent or life-threatening deterioration.  Critical care was time spent personally by me on the following activities: development of  treatment plan with patient and/or surrogate as well as nursing, discussions with consultants, evaluation of patient's response to treatment, examination of patient, obtaining history from patient or surrogate, ordering and performing treatments and interventions, ordering and review of laboratory studies, ordering and review of radiographic studies, pulse oximetry  and re-evaluation of patient's condition.  Darcella Gasman , PA-C [facility] See Amion for pager If no response to pager , please call [phone] until 7pm After 7:00 pm call Elink  [phone]

## 2023-03-01 NOTE — Progress Notes (Signed)
Pt requested protective heel foams be removed, I educated the patient on the purpose of the heel pads and why I believe it is only going to make his feet hurt worse. Pt still wanted heel foams removed and pt family insists I take them off. Heel foams were removed. Will continue to educate and attempt to replace.

## 2023-03-01 NOTE — Progress Notes (Signed)
Pt placed on BIPAP via servo for night time use. Pt tolerating well at this time, RT will monitor.   03/01/23 2224  Vent Select  Invasive or Noninvasive Noninvasive  Adult Vent Y  Adult Ventilator Settings  Vent Type Servo i  Vent Mode BIPAP;PCV  Set Rate 32 bmp  FiO2 (%) 40 %  I Time 0.9 Sec(s)  IPAP 30 cmH20  EPAP 10 cmH20  Pressure Control 20 cmH20  PEEP 10 cmH20  Adult Ventilator Measurements  Peak Airway Pressure 28 L/min  Mean Airway Pressure 19 cmH20  Resp Rate Spontaneous 0 br/min  Resp Rate Total 32 br/min  Exhaled Vt 789 mL  Measured Ve 25.2 L  I:E Ratio Measured 1:1.1  Auto PEEP 2 cmH20  Total PEEP 12 cmH20  SpO2 94 %  Adult Ventilator Alarms  Alarms On Y  VAP Prevention  HOB> 30 Degrees Y  Equipment wiped down Yes

## 2023-03-01 NOTE — Progress Notes (Signed)
Nutrition Follow-up  DOCUMENTATION CODES:   Morbid obesity  INTERVENTION:   Calorie Count   Feeding assistance with strict aspiration precautions   Magic cup TID with meals, each supplement provides 290 kcal and 9 grams of protein  Nepro Shake po TID, each supplement provides 425 kcal and 19 grams protein  Add Vitamin D for deficiency, 1000 units D3 daily  Continue to hold TF for now, await calorie count results to further. May need to resume TF earlier if hypoglycemia persists  Continue  Pro-Source Protein Modular and Juven BID per tube to promote wound healing for now, each packet of Juven provides 80 calories, 8 grams of carbohydrate, 2.5  grams of protein (collagen), 7 grams of L-arginine and 7 grams of L-glutamine; supplement contains CaHMB, Vitamins C, E, B12 and Zinc to promote wound healing   NUTRITION DIAGNOSIS:   Inadequate oral intake related to inability to eat as evidenced by NPO status.  Being addressed  GOAL:   Patient will meet greater than or equal to 90% of their needs  Progressing  MONITOR:   PO intake, Diet advancement, TF tolerance, Weight trends, Labs, Skin  REASON FOR ASSESSMENT:   Ventilator, Consult Assessment of nutrition requirement/status  ASSESSMENT:   37 year old male with PMHx of ESRD on HD, nonischemic cardiomyopathy, refractory A-fib, possible amio lung toxicity admitted with acute on chronic hypoxmic respiratory failure in setting of virus and missed HD, probable sepsis.  TF ordered but has been on hold since the weekend when pt with respiratory decline requiring BiPap. RN reports pt even vomited while wearing BiPap  Currently tolerating [place] with Bipap at night  Pt likes the Magic Cup, ate 100% of one at lunch today. Pt also agreeable to Ensure/Boost supplement  Noted hypoglycemic episodes, off TF, off citrate protocol. Currently on sliding scale only  Remains on CRRT, off citrate protocol. Per RN, plan to stop CRRT once filter  clots with trial of iHD  Vitamin/MIneral Profile:  CRP: 12.2 (H) Folate B9: 12.0 (wdl) Vitamin D: 14.20 (L) Vitamin C: 1.9 (wdl) Copper: 146 (H) Zinc: 132 (H)  Labs:  potassium 3.8 (wdl), phosphorus 2.3 (L), magnesium 2.2 (wdl) Meds: hectoral, ss novolog, rena-vite    Diet Order:   Diet Order             DIET DYS 2 Room service appropriate? Yes; Fluid consistency: Nectar Thick  Diet effective now                   EDUCATION NEEDS:   No education needs have been identified at this time  Skin:  Skin Assessment: Skin Integrity Issues: Skin Integrity Issues:: Stage II, Other (Comment) Stage II: coccyx Other: MASD to buttocks with fissure in gluteal fold; non-blanchable dark area on L hip, L flank, back (not presenting like DTIPI, thought possibly related to poor skin/tissue perfusion and pressor use  Last BM:  8/04 type 6  Height:   Ht Readings from Last 1 Encounters:  01/21/23 6' (1.829 m)    Weight:   Wt Readings from Last 1 Encounters:  03/01/23 (!) 178.8 kg    Ideal Body Weight:  80.9 kg  BMI:  Body mass index is 53.46 kg/m.  Estimated Nutritional Needs:   Kcal:  2100-2300 kcals  Protein:  160-190 g  Fluid:  UOP + 1 L   Romelle Starcher MS, RDN, LDN, CNSC Registered Dietitian 3 Clinical Nutrition RD Pager and On-Call Pager Number Located in [place]

## 2023-03-02 DIAGNOSIS — I5023 Acute on chronic systolic (congestive) heart failure: Secondary | ICD-10-CM | POA: Diagnosis not present

## 2023-03-02 DIAGNOSIS — J9601 Acute respiratory failure with hypoxia: Secondary | ICD-10-CM | POA: Diagnosis not present

## 2023-03-02 LAB — GLUCOSE, CAPILLARY
Glucose-Capillary: 81 mg/dL (ref 70–99)
Glucose-Capillary: 74 mg/dL (ref 70–99)
Glucose-Capillary: 81 mg/dL (ref 70–99)
Glucose-Capillary: 97 mg/dL (ref 70–99)
Glucose-Capillary: 87 mg/dL (ref 70–99)
Glucose-Capillary: 78 mg/dL (ref 70–99)

## 2023-03-02 LAB — POCT I-STAT 7, (LYTES, BLD GAS, ICA,H+H)
Acid-base deficit: 3 mmol/L — ABNORMAL HIGH (ref 0.0–2.0)
Bicarbonate: 24.7 mmol/L (ref 20.0–28.0)
Calcium, Ion: 1.28 mmol/L (ref 1.15–1.40)
HCT: 37 % — ABNORMAL LOW (ref 39.0–52.0)
Hemoglobin: 12.6 g/dL — ABNORMAL LOW (ref 13.0–17.0)
O2 Saturation: 93 %
Patient temperature: 97.8
Potassium: 4.1 mmol/L (ref 3.5–5.1)
Sodium: 133 mmol/L — ABNORMAL LOW (ref 135–145)
TCO2: 26 mmol/L (ref 22–32)
pCO2 arterial: 51.3 mmHg — ABNORMAL HIGH (ref 32–48)
pH, Arterial: 7.288 — ABNORMAL LOW (ref 7.35–7.45)
pO2, Arterial: 74 mmHg — ABNORMAL LOW (ref 83–108)

## 2023-03-02 MED ORDER — ANTICOAGULANT SODIUM CITRATE 4% (200MG/5ML) IV SOLN: 5.0000 mL | Status: DC | PRN

## 2023-03-02 MED ORDER — APIXABAN 5 MG PO TABS
5.0000 mg | ORAL_TABLET | Freq: Two times a day (BID) | ORAL | Status: DC
  Administered 2023-03-02 – 2023-03-03 (×4): 5 mg via ORAL
  Filled 2023-03-02 (×5): qty 1

## 2023-03-02 MED ORDER — VITAL 1.5 CAL PO LIQD
900.0000 mL | ORAL | Status: DC
  Administered 2023-03-03 – 2023-03-06 (×4): 900 mL
  Filled 2023-03-02 (×5): qty 948

## 2023-03-02 MED ORDER — CHLORHEXIDINE GLUCONATE CLOTH 2 % EX PADS
6.0000 | MEDICATED_PAD | Freq: Every day | CUTANEOUS | Status: DC
  Administered 2023-03-04 (×2): 6 via TOPICAL

## 2023-03-02 MED ORDER — PROSOURCE TF20 ENFIT COMPATIBL EN LIQD
60.0000 mL | Freq: Four times a day (QID) | ENTERAL | Status: DC
  Administered 2023-03-02 – 2023-03-06 (×12): 60 mL
  Filled 2023-03-02 (×13): qty 60

## 2023-03-02 NOTE — Progress Notes (Signed)
Patient was placed on BIPAP by RN based on ABG results. BIPAP settings were adjusted for patient comfort. Patient is tolerating BIPAP well at this time.

## 2023-03-02 NOTE — Progress Notes (Signed)
eLink Physician-Brief Progress Note Patient Name: Chivas Watring DOB: May 29, 1986 MRN: [id number]   Date of Service  03/02/2023  HPI/Events of Note  37 year old male with end-stage renal disease on hemodialysis, chronic HFrEF here with acute on chronic HFrEF with cardiogenic shock and respiratory failure   He has been receiving CRRT, came off of vasopressor support, and had converted into sinus rhythm  eICU Interventions  Now back in A-fib 120s-150s.  Known chronic issue, suspected but unconfirmed amiodarone toxicity.  As long as blood pressures are sustained, continue to monitor.     Intervention Category Intermediate Interventions: Arrhythmia - evaluation and management    03/02/2023, 8:38 PM

## 2023-03-02 NOTE — Progress Notes (Signed)
Patient stated that he thought he was at a 4 hour dialysis treatment at 1235. Dr Merrily Pew notified of confusion at 1237. Order for ABG placed per MD. Pt pH was 7.28, Co2 51.3. Pt placed on BiPAP.

## 2023-03-02 NOTE — Progress Notes (Addendum)
[facility] NEPH[facility]OLOG[facility] PROGRESS NOTE  Subjective:  on HFNC 6 lpm, BP's good, HR 80-100, off pressors. Net neg 1.9 L yesterday. 179kg today.    Objective Vital signs in last 24 hours: Vitals:   02/14/23 1600 02/14/23 1615 02/14/23 1630 02/14/23 1645  BP:      Pulse:      Resp: (!) 43 (!) 38 (!) 35 (!) 31  Temp: 98.1 F (36.7 C)     TempSrc: Axillary     SpO2: 100%     Weight:      Height:         Physical Exam: chronically ill looking male, [place] O2 Heart:RRR, s1s2 nl Lungs:coarse breath sound b/l Abdomen: soft, +bs Extremities: 1+ bilat hip edema  Dialysis Access: RIJ TDC intact, LIJ temp HD catheter running crrt   OP HD: [facility] TTS  4h   180kg  425/1.5  TDC  Na citrate cath lock (no heparin) - last OP HD was 6/25, post wt 193.8kg - consistently left HD 8-12kg up at least the prior 3 wks - sensipar 60mg  three times per week - hectorol 4 mcg IV three times per week - mircera 200 mcg  IV q 2 wks, last 6/13, due 6/27    Assessment/ Plan:  # Hypotension - no longer pressor dependent, came off pressors on around 8/03, cont midodrine 30 tid.   # Pneumococcal bacteremia: w/ septic shock (at time of admission). Complex course. Last blood culture 7/23 negative.  Off abx now.   # Acute hypoxic and hypercapnic respiratory failure: extubated on 7/20.  Stable on 6 L HFNC now.   # ESRD - usual HD is TTS. Required emergent HD on 6/29 because of fluid overload.  Then developed shock and afib/ RVR requiring high doses of pressors.  Started CRRT on 6/29. 8/2 required temp HD catheter placement after [facility] malfunctioned. IR placed new [facility] yesterday 8/06. Will dc CRRT today given remains off pressors and bp's stable. HD tomorrow.   # Anemia of CKD:  on max ESA with variable Hbs, generally in the 9s.   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 Will resume IV hectorol (half dose) since Ca++ levels are back to normal.   # Volume - mild LE edema, wt's down 179. Max UF w/ HD tomorrow.    # Acute on chronic systolic CHF, nonischemic: EF 62-13%.  #A-fib with RVR with hypotension: S/P cardioversion 7/5 -> NSR -> afib RVR 7/6 early AM refractory to [facility] x3 + amiodarone. Persistent afib/flutter w/ HR > 100   Rob   MD  [facility] 03/02/2023, 3:13 PM  Recent Labs  Lab 03/01/23 0327 03/01/23 1723 03/02/23 0515 03/02/23 1301  HGB 9.2*  --   --  12.6*  ALBUMIN  --  2.5* 2.7*  --   CALCIUM  --  9.6 10.0  --   PHOS  --  3.0 2.3*  --   CREATININE  --  2.17* 2.14*  --   K 3.8 3.9 3.9 4.1    Inpatient medications:  apixaban  5 mg Oral BID   Chlorhexidine Gluconate Cloth  6 each Topical Q0600   cholecalciferol  1,000 Units Oral Daily   darbepoetin (ARANESP) injection - DIALYSIS  200 mcg Subcutaneous Q Sat-1800   doxercalciferol  2 mcg Intravenous Q T,Th,Sa-HD   famotidine  20 mg Per Tube Daily   feeding supplement (NEPRO CARB STEADY)  237 mL Oral TID BM   feeding supplement (PROSource TF20)  60  mL Per Tube QID   [START ON 03/03/2023] feeding supplement (VITAL 1.5 CAL)  900 mL Per Tube Q24H   insulin aspart  0-9 Units Subcutaneous Q4H   lidocaine  1 patch Transdermal Q24H   melatonin  5 mg Per Tube QHS   midodrine  30 mg Oral TID WC   multivitamin  1 tablet Per Tube QHS   nutrition supplement (JUVEN)  1 packet Per Tube BID BM   mouth rinse  15 mL Mouth Rinse 4 times per day   sodium chloride flush  10-40 mL Intravenous Q12H    sodium chloride 10 mL/hr at 02/21/23 1444   anticoagulant sodium citrate     norepinephrine (LEVOPHED) Adult infusion Stopped (03/01/23 1108)   sodium chloride, acetaminophen **OR** acetaminophen, anticoagulant sodium citrate, bisacodyl, docusate, guaiFENesin-dextromethorphan, hydrocortisone cream, iohexol, ondansetron (ZOFRAN) IV, mouth rinse, oxyCODONE, polyethylene glycol, polyvinyl alcohol, senna-docusate, sodium chloride flush, surgical lubricant

## 2023-03-02 NOTE — Progress Notes (Signed)
NAME:  Phillip Young, MRN:  [id number], DOB:  August 10, 1985, LOS: 40 ADMISSION DATE:  01/21/2023, CONSULTATION DATE:  01/22/23 REFERRING MD:  Danise Edge, CHIEF COMPLAINT:  SOB   History of Present Illness:  37 year old man well known to the PCCM and AHF services who presented to [facility] 6/28 with fevers, joint pains, SOB. PMHx significant for ESRD, NICM, refractory AFib, ?amio-induced lung toxicity. Patient reportedly missed HD on Thursday (has a history of leaving HD early). Reportedly ~10kg over dry weight with worsening breathing status, AFib/RVR and borderline BP.  PCCM consulted for ICU admission/further evaluation and management.  Pertinent Medical History:   Past Medical History:  Diagnosis Date   Acute on chronic respiratory failure with hypoxia ([name]) 04/21/2021   Acute on chronic systolic (congestive) heart failure ([name]) 02/26/2020   Amiodarone toxicity    Anemia    Atrial flutter ([name])    Biventricular congestive heart failure ([name])    Chronic hypoxemic respiratory failure ([name])    Class 3 severe obesity due to excess calories with serious comorbidity and body mass index (BMI) of 50.0 to 59.9 in adult [facility]) 02/26/2020   Essential hypertension 02/26/2020   GERD without esophagitis 02/26/2020   Hidradenitis suppurativa 02/26/2020   NICM (nonischemic cardiomyopathy) ([name])    Obesity hypoventilation syndrome ([name])    OSA (obstructive sleep apnea)    PAF (paroxysmal atrial fibrillation) ([name])    Pneumonia    Prediabetes 02/26/2020   Significant Hospital Events: Including procedures, antibiotic start and stop dates in addition to other pertinent events   6/28 Admit, emergent HD 6/29 Afib/RVR, shock, ICU transfer 6/30 Intubated and placed on vasopressor support and started on CRRT 7/1 Remains intubated on CRRT continue with pressor support 7/2 Agitated with SAT, remains on CRRT and low dose levophed 7/3 low dose NE, ongoing CRRT 7/4 Attempted SBT this am, apneic but severely agitated,  desaturated, HR up 130/140s.  Re-sedated but HR remains 140's, NE up from 6 to . NSR on EKG.  Was pulling 50-100on UF, since kept even for now.  CVP 8 7/5 DCCV again attempted this a.m. with success, remains on norepinephrine and vasopressin.  7/6 went into A-fib with RVR, attempted DCCV x 3, started on amiodarone infusion after amiodarone bolus 7/7 Multiple DCCV attempts without success. TF held due to vomiting. Tx denied to [facility]  7/8 KUB looked OK. No further vomiting. Tolerating TF. Large BM overnight.  7/9 vasopressors back up, remains rapid afib/ flutter, Bmx 1, wua causing hemodynamic instability 7/10 weaned sedation on mothers request, HR remains 140-220s, tmax 100.6, DNR, no escalation.  Heart failure signed off, not a candidate for advanced heart failure therapies at this time 7/12 [facility] denied transfer, CODE STATUS changed from DNR back to full code 7/13 Amiodarone restarted with ESR trend  7/16 Amio stopped with no improvement see in rate control  7/20 extubated 7/22 Issues with agitation post extubation, improved with Precedex. Tmax 101, respiratory culture with moderate yeast and fluconazole started 7/23 ID consulted  7/24 ID stopped fluconazole and started Daptomycin for fever of unknown origin  7/25 Intermittent BIPAP need 7/28 Remains on vasopressor support with Levophed and vasopressin. Refractory A-fib with heart rate in 140s to 160s 7/29 CVC dislodged during attempts to ambulate, CRRT stopped and central access utilized for both levo and vaso.  PICC line order placed 7/30 PICC placed overnight, back on CRRT and levophed/.vaso and CRRT 7/31 no changes, Dr. Denese Killings starting Digoxin 8/5 Now off Dig and amiodarone, in sinus rhythm and off  pressors  8/6 back on low dose levophed  8/7 off pressors, tunneled HD catheter was exchanged   Interim History / Subjective:   Off pressors since yesterday Stable respiratory and hemodynamic status No complaints  Tunneled HD catheter  replaced by IR  Objective:  Blood pressure (!) 111/52, pulse 89, temperature 98.2 F (36.8 C), temperature source Oral, resp. rate (!) 21, height 6' (1.829 m), weight (!) 179.2 kg, SpO2 98%. CVP:  [3 mmHg-26 mmHg] 11 mmHg  Vent Mode: BIPAP;PCV FiO2 (%):  [40 %] 40 % Set Rate:  [32 bmp] 32 bmp PEEP:  [10 cmH20] 10 cmH20   Intake/Output Summary (Last 24 hours) at 03/02/2023 1041 Last data filed at 03/02/2023 1000 Gross per 24 hour  Intake 1736.07 ml  Output 3610.61 ml  Net -1874.54 ml   Filed Weights   02/28/23 0500 03/01/23 0500 03/02/23 0500  Weight: (!) 180.2 kg (!) 178.8 kg (!) 179.2 kg      General:  obese, ill-appearing M resting in bed in no acute distress HEENT: MM pink/moist, sclera anicteric Neuro: alert and oriented, conversational and following commands CV: s1s2 rrr, no m/r/g PULM:  diminished throughout, no distress on nasal cannula R tunneled HD catheter GI: soft, obese, non-tender  Extremities: warm/dry, 1+ edema  Skin: no rashes or lesions  Coox 75% Hgb 8.7  Resolved Hospital Problem List:  Hematuria due to traumatic Foley insertion Pneumococcal bacteremia Fever of unknown origin  Bilateral multifocal pneumonia Septic and cardiogenic shock  Assessment & Plan:    Acute-on-chronic hypoxemic/hypercapnic respiratory failure OHS/OSA Acute metabolic encephalopathy in the setting of hypercapnia Noncompliant with CPAP at baseline Initially strep and superimposed HCAP -Now stable, continue bipap at bedtime, mobilization as able and fluid removal with dialysis  -IS    Acute on chronic HFrEF with pulmonary edema Persistent A-fib/flutter with RVR Nonischemic cardiomyopathy EF less than 20% Mixed shock septic/cardiogenic Now in predominantly sinus rhythm and intermittently on low dose levophed, but off today for ~24hrs Many conversations regarding limited therapeutic options and HF team signed off 7/10, mother and patient feel that he can improve and want  him transferred to another facility, no accepting facility found at [facility] or [facility] -coox stable  -intermittently requiring low dose levophed -On high-dose midodrine, nephrology to start weaning today -tunneled catheter replaced, hopefully can transition from CRRT to iHD soon -resume Eliquis  -remains in sinus rhythm    End-stage renal disease on hemodialysis Recurrent metabolic acidosis, improving Metabolic bone disease  Hyper/Hypocalcemia  Nephrology following, appreciate assistance -remains on CRRT though tunneled catheter, transitioning to iHD today -on UF 100cc/hr  Hypervolemic hyponatremia Hyperphosphatemia Hyperkalemia  Hypocalcemia -Closely monitor electrolytes  Anemia of chronic disease -Monitor H&H and transfuse if less than 7  Skin breakdown, not POA -Perineum, pt refusing rectal tube -Continue local wound care   Best Practice (right click and "Reselect all SmartList Selections" daily)   Diet/type: regular  DVT prophylaxis: DOAC,  GI prophylaxis: Famotidine Lines: Yes still needed, PICC, L internal jugular trialysis, can be removed tomorrow if stable  Foley: n/a Code Status:  Full Last date of multidisciplinary goals of care: 8/5 with patient and mother,  continue full code and full scope of care, would want repeat intubation if absolutely needed   CRITICAL CARE Performed by: Darcella Gasman    Total critical care time: 30 minutes  Critical care time was exclusive of separately billable procedures and treating other patients.  Critical care was necessary to treat or prevent imminent or life-threatening deterioration.  Critical care was time spent personally by me on the following activities: development of treatment plan with patient and/or surrogate as well as nursing, discussions with consultants, evaluation of patient's response to treatment, examination of patient, obtaining history from patient or surrogate, ordering and performing treatments and  interventions, ordering and review of laboratory studies, ordering and review of radiographic studies, pulse oximetry and re-evaluation of patient's condition.  Darcella Gasman , PA-C [facility] See Amion for pager If no response to pager , please call [phone] until 7pm After 7:00 pm call Elink  [phone]

## 2023-03-02 NOTE — Progress Notes (Signed)
Nutrition Follow-up  DOCUMENTATION CODES:   Morbid obesity  INTERVENTION:   Feeding assistance with strict aspiration precautions with meals  Tube Feeding via Cortrak: Day Time Feedings (BiPap at night-TF on hold while sleeping) Vital 1.5 at 75 ml/hr x 12 hours during the day (8am to 8pm)  Pro-Source TF20 60 mL QID per tube, each packet provides 20 grams of protein and 80 kcals.   Nepro Shake po TID, each supplement provides 425 kcal and 19 grams protein  Magic cup BID with meals, each supplement provides 290 kcal and 9 grams of protein  Continue Renal MVI and Vit D supplementation  Continue Juven BID per tube, each packet provides 80 calories, 8 grams of carbohydrate, 2.5  grams of protein (collagen), 7 grams of L-arginine and 7 grams of L-glutamine; supplement contains CaHMB, Vitamins C, E, B12 and Zinc to promote wound healing  NUTRITION DIAGNOSIS:   Inadequate oral intake related to inability to eat as evidenced by NPO status.  Being addressed via TF and oral intake  GOAL:   Patient will meet greater than or equal to 90% of their needs  Progressing  MONITOR:   PO intake, Diet advancement, TF tolerance, Weight trends, Labs, Skin  REASON FOR ASSESSMENT:   Ventilator, Consult Assessment of nutrition requirement/status  ASSESSMENT:   37 year old male with PMHx of ESRD on HD, nonischemic cardiomyopathy, refractory A-fib, possible amio lung toxicity admitted with acute on chronic hypoxmic respiratory failure in setting of virus and missed HD, probable sepsis.  Currently off pressors, CRRT to stop today  Pt drank 100% of Nepro this AM, not eating much at meals per RN. Pt does like the Magic Cups, ate 100% of one at lunch yesterday  Cortrak remains in place, receiving Juven and Pro-Source per tube  Last BM 8/4  No hypoglycemia in past 24 hours  Labs: sodium 133 (L), potassium wdl Meds: reviewed  Diet Order:   Diet Order             DIET DYS 2 Room service  appropriate? Yes; Fluid consistency: Nectar Thick  Diet effective now                   EDUCATION NEEDS:   No education needs have been identified at this time  Skin:  Skin Assessment: Skin Integrity Issues: Skin Integrity Issues:: Stage II, Other (Comment) Stage II: coccyx Other: MASD to buttocks with fissure in gluteal fold; non-blanchable dark area on L hip, L flank, back (not presenting like DTIPI, thought possibly related to poor skin/tissue perfusion and pressor use  Last BM:  8/04 type 6  Height:   Ht Readings from Last 1 Encounters:  01/21/23 6' (1.829 m)    Weight:   Wt Readings from Last 1 Encounters:  03/02/23 (!) 179.2 kg    Ideal Body Weight:  80.9 kg  BMI:  Body mass index is 53.58 kg/m.  Estimated Nutritional Needs:   Kcal:  2100-2300 kcals  Protein:  160-190 g  Fluid:  UOP + 1 L   Romelle Starcher MS, RDN, LDN, CNSC Registered Dietitian 3 Clinical Nutrition RD Pager and On-Call Pager Number Located in [place]

## 2023-03-02 NOTE — Progress Notes (Addendum)
Phillip Young Cancellation Note  Patient Details Name: Phillip Young MRN: [id number] DOB: Apr 07, 1986   Cancelled Treatment:    Reason Eval/Treat Not Completed: Patient on BiPAP and declines to work with physical therapy. Will follow up for Phillip Young treatment as appropriate; Phillip Young requesting water though voice difficult to understand through mask, RN notified.  Phillip Young, Phillip Young, Phillip Young Acute Rehabilitation Services  Personal: Secure Chat Rehab Office: [phone]  Malachy Chamber 03/02/2023, 2:57 PM

## 2023-03-02 NOTE — Progress Notes (Signed)
Pt placed on BIPAP for night rest.  Tolerating well. 

## 2023-03-03 DIAGNOSIS — J9601 Acute respiratory failure with hypoxia: Secondary | ICD-10-CM | POA: Diagnosis not present

## 2023-03-03 DIAGNOSIS — R570 Cardiogenic shock: Secondary | ICD-10-CM | POA: Diagnosis not present

## 2023-03-03 DIAGNOSIS — I4891 Unspecified atrial fibrillation: Secondary | ICD-10-CM | POA: Diagnosis not present

## 2023-03-03 LAB — GLUCOSE, CAPILLARY
Glucose-Capillary: 86 mg/dL (ref 70–99)
Glucose-Capillary: 91 mg/dL (ref 70–99)
Glucose-Capillary: 106 mg/dL — ABNORMAL HIGH (ref 70–99)
Glucose-Capillary: 113 mg/dL — ABNORMAL HIGH (ref 70–99)
Glucose-Capillary: 87 mg/dL (ref 70–99)

## 2023-03-03 LAB — HEPATITIS B SURFACE ANTIGEN: Hepatitis B Surface Ag: NONREACTIVE

## 2023-03-03 MED ORDER — NOREPINEPHRINE 16 MG/250ML-% IV SOLN
0.0000 ug/min | INTRAVENOUS | Status: DC
  Filled 2023-03-03: qty 250

## 2023-03-03 MED ORDER — HEPARIN SODIUM (PORCINE) 1000 UNIT/ML IJ SOLN
INTRAMUSCULAR | Status: AC
  Filled 2023-03-03: qty 5

## 2023-03-03 MED ORDER — ALTEPLASE 2 MG IJ SOLR
4.0000 mg | Freq: Once | INTRAMUSCULAR | Status: AC
  Administered 2023-03-03: 4 mg
  Filled 2023-03-03: qty 4

## 2023-03-03 MED ORDER — HYDROXYZINE HCL 25 MG PO TABS
25.0000 mg | ORAL_TABLET | Freq: Once | ORAL | Status: AC
  Administered 2023-03-03: 25 mg via ORAL
  Filled 2023-03-03: qty 1

## 2023-03-03 MED ORDER — ANTICOAGULANT SODIUM CITRATE 4% (200MG/5ML) IV SOLN
5.0000 mL | Status: AC | PRN
  Administered 2023-03-11: 5 mL via INTRAVENOUS_CENTRAL
  Filled 2023-03-03 (×3): qty 5

## 2023-03-03 MED ORDER — NOREPINEPHRINE 4 MG/250ML-% IV SOLN
INTRAVENOUS | Status: AC
  Filled 2023-03-03: qty 250

## 2023-03-03 NOTE — Progress Notes (Signed)
Physical Therapy Treatment Patient Details Name: Phillip Young MRN: [id number] DOB: Jun 04, 1986 Today's Date: 03/03/2023   History of Present Illness Pt is a 37 y/o M presenting to ED on 6/28 with fever, joint pain, and SOB after missing HD. Admitted for acute hypoxic respiratory failure. Intubated and started on CRRT, vasopressor support on 6/30. Extubated 7/20. Underwent DCCV 7/5, 7/6, and 7/7 without success. Pt off CRRT on 8/7.  PMH includes ESRD, NICM, refractory A fib, HFrEF    PT Comments  Worked on tilting for LE weight bearing. Pt tolerated 30 degrees for 2 minutes and 40 degrees for 4 minutes before c/o dizziness and asking to return to supine. BP stable 100's/50's. Pt with slow processing and difficulty understanding goal of session.  Patient will benefit from continued inpatient follow up therapy, <3 hours/day    If plan is discharge home, recommend the following: Two people to help with walking and/or transfers;Two people to help with bathing/dressing/bathroom;Direct supervision/assist for medications management;Direct supervision/assist for financial management;Assist for transportation;Help with stairs or ramp for entrance   Can travel by private vehicle     No  Equipment Recommendations   (TBD at next venue)    Recommendations for Other Services       Precautions / Restrictions Precautions Precautions: Fall Precaution Comments: NGT, multiple lines, wounds on hip/back/buttocks Restrictions Weight Bearing Restrictions: No     Mobility  Bed Mobility Overal bed mobility: Needs Assistance Bed Mobility: Rolling Rolling: Max assist, +2 for physical assistance           Start Time: 1237 Angle: 40 degrees Total Minutes in Angle: 4 minutes Patient Response: Flat affect  Transfers                   General transfer comment: Used tilt bed    Ambulation/Gait               General Gait Details: unable this date   Stairs              Wheelchair Mobility     Tilt Bed Tilt Bed Patient on Tilt Bed?: Yes Start Time: 1237 Angle: 40 degrees Total Minutes in Angle: 4 minutes Patient Response: Flat affect  Modified Rankin (Stroke Patients Only)       Balance                                            Cognition Arousal: Alert Behavior During Therapy: Flat affect Overall Cognitive Status: No family/caregiver present to determine baseline cognitive functioning                                 General Comments: Pt with difficulty understanding plan to use tilt bed to get some weight on LE's. Asking if someone can flip him over        Exercises      General Comments        Pertinent Vitals/Pain Pain Assessment Pain Assessment: Faces Faces Pain Scale: Hurts even more Pain Location: buttocks ,back Pain Descriptors / Indicators: Discomfort, Aching Pain Intervention(s): Monitored during session, Repositioned, Limited activity within patient's tolerance    Home Living                          Prior Function  PT Goals (current goals can now be found in the care plan section) Acute Rehab PT Goals Patient Stated Goal: Not stated Progress towards PT goals: Not progressing toward goals - comment    Frequency    Min 1X/week      PT Plan Current plan remains appropriate    Co-evaluation              AM-PAC PT "6 Clicks" Mobility   Outcome Measure  Help needed turning from your back to your side while in a flat bed without using bedrails?: Total Help needed moving from lying on your back to sitting on the side of a flat bed without using bedrails?: Total Help needed moving to and from a bed to a chair (including a wheelchair)?: Total Help needed standing up from a chair using your arms (e.g., wheelchair or bedside chair)?: Total Help needed to walk in hospital room?: Total Help needed climbing 3-5 steps with a railing? : Total 6  Click Score: 6    End of Session Equipment Utilized During Treatment: Oxygen Activity Tolerance: Patient limited by fatigue Patient left: in bed;with call bell/phone within reach Nurse Communication: Mobility status PT Visit Diagnosis: Muscle weakness (generalized) (M62.81);Other abnormalities of gait and mobility (R26.89)     Time: 2956-2130 PT Time Calculation (min) (ACUTE ONLY): 26 min  Charges:    $Therapeutic Activity: 23-37 mins PT General Charges $$ ACUTE PT VISIT: 1 Visit                     [facility] PT Acute Rehabilitation Services Office [phone]    Angelina Ok [facility] 03/03/2023, 5:25 PM

## 2023-03-03 NOTE — Progress Notes (Signed)
NAME:  Phillip Young, MRN:  [id number], DOB:  30-Apr-1986, LOS: 41 ADMISSION DATE:  01/21/2023, CONSULTATION DATE:  01/22/23 REFERRING MD:  Danise Edge, CHIEF COMPLAINT:  SOB   History of Present Illness:  37 year old man well known to the PCCM and AHF services who presented to [facility] 6/28 with fevers, joint pains, SOB. PMHx significant for ESRD, NICM, refractory AFib, ?amio-induced lung toxicity. Patient reportedly missed HD on Thursday (has a history of leaving HD early). Reportedly ~10kg over dry weight with worsening breathing status, AFib/RVR and borderline BP.  PCCM consulted for ICU admission/further evaluation and management.  Pertinent Medical History:   Past Medical History:  Diagnosis Date   Acute on chronic respiratory failure with hypoxia ([name]) 04/21/2021   Acute on chronic systolic (congestive) heart failure ([name]) 02/26/2020   Amiodarone toxicity    Anemia    Atrial flutter ([name])    Biventricular congestive heart failure ([name])    Chronic hypoxemic respiratory failure ([name])    Class 3 severe obesity due to excess calories with serious comorbidity and body mass index (BMI) of 50.0 to 59.9 in adult [facility]) 02/26/2020   Essential hypertension 02/26/2020   GERD without esophagitis 02/26/2020   Hidradenitis suppurativa 02/26/2020   NICM (nonischemic cardiomyopathy) ([name])    Obesity hypoventilation syndrome ([name])    OSA (obstructive sleep apnea)    PAF (paroxysmal atrial fibrillation) ([name])    Pneumonia    Prediabetes 02/26/2020   Significant Hospital Events: Including procedures, antibiotic start and stop dates in addition to other pertinent events   6/28 Admit, emergent HD 6/29 Afib/RVR, shock, ICU transfer 6/30 Intubated and placed on vasopressor support and started on CRRT 7/1 Remains intubated on CRRT continue with pressor support 7/2 Agitated with SAT, remains on CRRT and low dose levophed 7/3 low dose NE, ongoing CRRT 7/4 Attempted SBT this am, apneic but severely agitated,  desaturated, HR up 130/140s.  Re-sedated but HR remains 140's, NE up from 6 to . NSR on EKG.  Was pulling 50-100on UF, since kept even for now.  CVP 8 7/5 DCCV again attempted this a.m. with success, remains on norepinephrine and vasopressin.  7/6 went into A-fib with RVR, attempted DCCV x 3, started on amiodarone infusion after amiodarone bolus 7/7 Multiple DCCV attempts without success. TF held due to vomiting. Tx denied to [facility]  7/8 KUB looked OK. No further vomiting. Tolerating TF. Large BM overnight.  7/9 vasopressors back up, remains rapid afib/ flutter, Bmx 1, wua causing hemodynamic instability 7/10 weaned sedation on mothers request, HR remains 140-220s, tmax 100.6, DNR, no escalation.  Heart failure signed off, not a candidate for advanced heart failure therapies at this time 7/12 [facility] denied transfer, CODE STATUS changed from DNR back to full code 7/13 Amiodarone restarted with ESR trend  7/16 Amio stopped with no improvement see in rate control  7/20 extubated 7/22 Issues with agitation post extubation, improved with Precedex. Tmax 101, respiratory culture with moderate yeast and fluconazole started 7/23 ID consulted  7/24 ID stopped fluconazole and started Daptomycin for fever of unknown origin  7/25 Intermittent BIPAP need 7/28 Remains on vasopressor support with Levophed and vasopressin. Refractory A-fib with heart rate in 140s to 160s 7/29 CVC dislodged during attempts to ambulate, CRRT stopped and central access utilized for both levo and vaso.  PICC line order placed 7/30 PICC placed overnight, back on CRRT and levophed/.vaso and CRRT 7/31 no changes, Dr. Denese Killings starting Digoxin 8/5 Now off Dig and amiodarone, in sinus rhythm and off  pressors  8/6 back on low dose levophed  8/7 off pressors, tunneled HD catheter was exchanged   Interim History / Subjective:  Remains off vasopressor support Converted back to A-fib overnight with RVR, heart rate ranging between 140-170  but asymptomatic On high flow nasal cannula oxygen Complaining of itching all around  Objective:  Blood pressure 125/66, pulse (!) 140, temperature 98.2 F (36.8 C), temperature source Oral, resp. rate 18, height 6' (1.829 m), weight (!) 180.7 kg, SpO2 100%. CVP:  [7 mmHg-35 mmHg] 9 mmHg  FiO2 (%):  [40 %] 40 % PEEP:  [10 cmH20] 10 cmH20   Intake/Output Summary (Last 24 hours) at 03/03/2023 0754 Last data filed at 03/02/2023 2000 Gross per 24 hour  Intake 960 ml  Output 300 ml  Net 660 ml   Filed Weights   03/01/23 0500 03/02/23 0500 03/03/23 0500  Weight: (!) 178.8 kg (!) 179.2 kg (!) 180.7 kg    Physical exam: General: Morbidly obese male, lying on the bed HEENT: [place]/AT, eyes anicteric.  moist mucus membranes Neuro: Alert, awake following commands Chest: Coarse breath sounds, no wheezes or rhonchi Heart: Irregularly irregular, tachycardic, no murmurs or gallops Abdomen: Soft, nontender, nondistended, bowel sounds present Skin: Unstageable decubitus ulcer noted on sacrum  Coox 69%  Resolved Hospital Problem List:  Hematuria due to traumatic Foley insertion Pneumococcal bacteremia Fever of unknown origin  Bilateral multifocal pneumonia Mixed septic and cardiogenic shock  Assessment & Plan:  Acute-on-chronic hypoxemic/hypercapnic respiratory failure OHS/OSA Acute metabolic encephalopathy in the setting of hypercapnia Noncompliant with CPAP at baseline Now on high flow nasal cannula oxygen at 6 L during daytime and BiPAP at night Encourage incentive spirometry and PT OT Mental status has improved, awake and following commands  Acute on chronic HFrEF with pulmonary edema Paroxysmal A-fib/flutter with RVR Nonischemic cardiomyopathy EF less than 20% Went back into A-fib with RVR overnight, heart rate ranging between 140-170 Probably related to volume overload after stopping CRRT Monitor intake and output He is due for dialysis today hopefully that will help him  converting to sinus rhythm again Remain off vasopressor support  Continue Eliquis for stroke prophylaxis  End-stage renal disease on hemodialysis Recurrent metabolic acidosis, improving Metabolic bone disease  Hyper/Hypocalcemia  Nephrology following, appreciate assistance CRRT was stopped Patient is scheduled for intermittent hemodialysis today  Hypervolemic hyponatremia Hyperphosphatemia Hyperkalemia  Hypocalcemia Closely monitor electrolytes and supplement  Anemia of chronic disease -Monitor H&H and transfuse if less than 7  Skin breakdown, not POA Unstageable perineum and sacral skin breakdown, pt refusing rectal tube Continue local wound care   Best Practice (right click and "Reselect all SmartList Selections" daily)   Diet/type: regular  DVT prophylaxis: DOAC,  GI prophylaxis: Famotidine Lines: Yes still needed, PICC, tunneled HD catheter Foley: n/a Code Status:  Full Last date of multidisciplinary goals of care: 8/5 with patient and mother,  continue full code and full scope of care, would want repeat intubation if absolutely needed     Cheri Fowler, MD [facility] See Amion for pager If no response to pager, please call [phone] until 7pm After 7pm, Please call E-link [phone]

## 2023-03-03 NOTE — Progress Notes (Signed)
[facility] NEPHROLOGY PROGRESS NOTE  Subjective:  on HFNC 6 lpm, BP's soft, HR back in afib. On HD this afternoon   Objective Vital signs in last 24 hours: Vitals:   02/14/23 1600 02/14/23 1615 02/14/23 1630 02/14/23 1645  BP:      Pulse:      Resp: (!) 43 (!) 38 (!) 35 (!) 31  Temp: 98.1 F (36.7 C)     TempSrc: Axillary     SpO2: 100%     Weight:      Height:         Physical Exam: chronically ill looking male, [place] O2 Heart:RRR, s1s2 nl Lungs:coarse breath sound b/l Abdomen: soft, +bs Extremities: 1+ bilat hip edema  Dialysis Access: RIJ TDC intact, LIJ temp HD catheter running crrt   OP HD: [facility] TTS  4h   180kg  425/1.5  TDC  Na citrate cath lock (no heparin) - last OP HD was 6/25, post wt 193.8kg - consistently left HD 8-12kg up at least the prior 3 wks - sensipar 60mg  three times per week - hectorol 4 mcg IV three times per week - mircera 200 mcg  IV q 2 wks, last 6/13, due 6/27    Assessment/ Plan:  # acute on chronic hypotension - came off pressors on around 8/03, cont midodrine 30 tid.   # Pneumococcal bacteremia: w/ septic shock (at time of admission). Complex course. Last blood culture 7/23 negative.  Off abx now.   # Acute hypoxic and hypercapnic respiratory failure: extubated on 7/20.  Stable on 6 L HFNC now.   # ESRD - usual HD is TTS. Required emergent HD on 6/29 because of fluid overload.  Then developed shock and afib/ RVR requiring high doses of pressors.  Started CRRT on 6/29. 8/2 required temp HD catheter placement after [facility] malfunctioned. IR placed new [facility] 8/06. CRRT dc'd 8/07 since off pressors and bp's stable. iHD today.   # Anemia of CKD:  on max ESA with variable Hbs, generally in the 9s.   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 Will resume IV hectorol (half dose) since Ca++ levels are back to normal.   # Volume - mild LE edema, wt's down 179. Max UF w/ HD tomorrow. May need pressors support during HD to get enough fluid off  (goal 3 L per HD). Need to keep his volume down under control ( < 180 kg or so).   # Acute on chronic systolic CHF, nonischemic: EF 40-98%.  #A-fib with RVR with hypotension: S/P cardioversion 7/5 -> NSR -> afib RVR 7/6 early AM refractory to [facility] x3 + amiodarone. Persistent afib/flutter w/ HR > 100   Rob   MD  [facility] 03/03/2023, 3:53 PM  Recent Labs  Lab 03/01/23 0327 03/01/23 1723 03/02/23 0515 03/02/23 1301 03/03/23 0422  HGB 9.2*  --   --  12.6*  --   ALBUMIN  --  2.5* 2.7*  --   --   CALCIUM  --  9.6 10.0  --  10.7*  PHOS  --  3.0 2.3*  --  3.5  CREATININE  --  2.17* 2.14*  --  3.52*  K 3.8 3.9 3.9 4.1 4.1    Inpatient medications:  apixaban  5 mg Oral BID   Chlorhexidine Gluconate Cloth  6 each Topical Q0600   Chlorhexidine Gluconate Cloth  6 each Topical Q0600   cholecalciferol  1,000 Units Oral Daily   darbepoetin (ARANESP) injection - DIALYSIS  200 mcg Subcutaneous Q Sat-1800   doxercalciferol  2 mcg Intravenous Q T,Th,Sa-HD   famotidine  20 mg Per Tube Daily   feeding supplement (NEPRO CARB STEADY)  237 mL Oral TID BM   feeding supplement (PROSource TF20)  60 mL Per Tube QID   feeding supplement (VITAL 1.5 CAL)  900 mL Per Tube Q24H   heparin sodium (porcine)       insulin aspart  0-9 Units Subcutaneous Q4H   lidocaine  1 patch Transdermal Q24H   melatonin  5 mg Per Tube QHS   midodrine  30 mg Oral TID WC   multivitamin  1 tablet Per Tube QHS   nutrition supplement (JUVEN)  1 packet Per Tube BID BM   mouth rinse  15 mL Mouth Rinse 4 times per day    sodium chloride 10 mL/hr at 02/21/23 1444   anticoagulant sodium citrate     norepinephrine (LEVOPHED) Adult infusion     norepinephrine     sodium chloride, acetaminophen **OR** acetaminophen, anticoagulant sodium citrate, bisacodyl, docusate, guaiFENesin-dextromethorphan, heparin sodium (porcine), hydrocortisone cream, iohexol, norepinephrine, ondansetron (ZOFRAN) IV, mouth rinse, oxyCODONE, polyethylene  glycol, polyvinyl alcohol, senna-docusate, surgical lubricant

## 2023-03-03 NOTE — Progress Notes (Signed)
CSW following to speak with patient regarding PT recs. Closer to patient being medically ready for dc.

## 2023-03-03 NOTE — Progress Notes (Signed)
Speech Language Pathology Treatment: Dysphagia  Patient Details Name: Phillip Young MRN: [id number] DOB: [birth year] Today's Date: 03/03/2023 Time: 4098-1191 SLP Time Calculation (min) (ACUTE ONLY): 22 min  Assessment / Plan / Recommendation Clinical Impression  Pt seen for ongoing dysphagia management.  Pt agreeable to POs today.  He tolerated HoB at over 50 degrees for 3-5 minutes. Pt tolerated large serial straw sips of nectar thick liquid with no clinical s/s of aspiration. RN reports pocketing/expectoration of some foods (e.g. eggs).  Pt reports this is because the food does not taste good. Today pt achieved adequate oral clearance with puree. There was prolonged oral phase with simulated ground consistency and soft solid.  Pt benefited from following more solid textures with puree.  There was trace, diffuse oral residue.  Pt asked for head of bed to be reclined. Discussed diet preference with pt who believes puree diet might be easier for him to eat and is agreeable to a trial of that texture.  If it is unpalatable, pt could be advanced back to dysphagia 2 (ground/minced) diet. Aphonic vocal quality persists at this time.  Pt with known L VF immobility, but had been producing more voicing early 1-2 weeks ago). Unfortunately 2/2 decreased tolerance for upright positioning, pt is not appropriate for repeat FEES at this time.   Recommend puree diet with thin liquids. Pt may have ice chips and small amounts of water by spoon, in between meals only, in moderation, after good oral care, when fully awake/alert, with upright positioning and direct supervision.     HPI HPI: Pt is a 37 year old male who presented with fever, joint pains, SOB. ETT 6/30-7/20.  As of 8/8, off pressors and CRRT with plans to start HD in room. PMH: ESRD, GERD, NICM, refractory AFib, ?amio-induced lung toxicity.      SLP Plan  Continue with current plan of care      Recommendations for follow up therapy are one component  of a multi-disciplinary discharge planning process, led by the attending physician.  Recommendations may be updated based on patient status, additional functional criteria and insurance authorization.    Recommendations  Diet recommendations: Dysphagia 1 (puree);Nectar-thick liquid Liquids provided via: Cup;Straw Medication Administration: Whole meds with puree Supervision: Trained caregiver to feed patient Compensations: Slow rate;Small sips/bites;Follow solids with liquid Postural Changes and/or Swallow Maneuvers: Seated upright 90 degrees                  Oral care BID;Oral care prior to ice chip/H20   Frequent or constant Supervision/Assistance Dysphagia, oropharyngeal phase (R13.12)     Continue with current plan of care     Kerrie Pleasure, MA, CCC-SLP Acute Rehabilitation Services Office: [phone] 03/03/2023, 10:28 AM

## 2023-03-03 NOTE — Progress Notes (Signed)
   Informed consent signed and in chart.    TX duration: 2 hrs 54 mins  Hand-off given to patient's nurse.    Access used: R CVC Access issues: High AP which caused BFR to be lowered  and tx to be ended 6 mins early, Cathflo instilled in dialysis CVC lumens.   Total UF removed: 3147 mL Medication(s) given: Hectorol Post HD VS: WNL Post HD weight: 180 kg     [facility]

## 2023-03-03 NOTE — Progress Notes (Signed)
Occupational Therapy Treatment Patient Details Name: Phillip Young MRN: [id number] DOB: 04/20/1986 Today's Date: 03/03/2023   History of present illness Pt is a 37 y/o M presenting to ED on 6/28 with fever, joint pain, and SOB after missing HD. Admitted for acute hypoxic respiratory failure. Intubated and started on CRRT, vasopressor support on 6/30. Extubated 7/20. Underwent DCCV 7/5, 7/6, and 7/7 without success. PMH includes ESRD, NICM, refractory A fib, HFrEF   OT comments  Patient with incremental progress toward patient focused goals.  Patient hyper focused on discomfort to his bottom.  Patient able to roll to his right with Max A and use of wedge to help him stay on his right side.  Patient able to participate with a/AAROM to bilateral upper extremities.  Right upper extremity remains significantly weaker than the left.  OT to continue efforts in the acute setting to address deficits, and Patient will benefit from continued inpatient follow up therapy, <3 hours/day       If plan is discharge home, recommend the following:  Two people to help with walking and/or transfers;Two people to help with bathing/dressing/bathroom;Assistance with cooking/housework;Assistance with feeding;Direct supervision/assist for medications management;Direct supervision/assist for financial management;Help with stairs or ramp for entrance;Assist for transportation   Equipment Recommendations  None recommended by OT    Recommendations for Other Services      Precautions / Restrictions Precautions Precautions: Fall Precaution Comments: NGT, wounds on hip/back/buttocks Restrictions Weight Bearing Restrictions: No       Mobility Bed Mobility Overal bed mobility: Needs Assistance Bed Mobility: Rolling Rolling: Max assist           Patient Response: Cooperative  Transfers                         Balance                                           ADL either performed  or assessed with clinical judgement   ADL       Grooming: Wash/dry face;Set up;Bed level                                      Extremity/Trunk Assessment Upper Extremity Assessment Upper Extremity Assessment: Generalized weakness RUE Deficits / Details: unable to flex shoulder without assists, elbow/wrist/hand ROM 3/5 RUE Coordination: decreased fine motor;decreased gross motor   Lower Extremity Assessment Lower Extremity Assessment: Defer to PT evaluation        Vision   Vision Assessment?: No apparent visual deficits   Perception Perception Perception: Not tested   Praxis Praxis Praxis: Not tested    Cognition Arousal: Alert Behavior During Therapy: Flat affect Overall Cognitive Status: No family/caregiver present to determine baseline cognitive functioning                                          Exercises General Exercises - Upper Extremity Shoulder Extension: AAROM, Both, 10 reps, Supine Elbow Flexion: AAROM, AROM, Both, 10 reps, Supine Elbow Extension: AROM, AAROM, Both, 10 reps, Supine Wrist Flexion: AROM, AAROM, Both, 10 reps, Supine Wrist Extension: AROM, AAROM, Both, 10 reps, Supine Digit Composite Flexion: AROM, Both, 10 reps, Supine  Shoulder Instructions       General Comments      Pertinent Vitals/ Pain       Pain Assessment Pain Assessment: Faces Faces Pain Scale: Hurts little more Pain Location: buttocks ,back Pain Descriptors / Indicators: Discomfort, Aching, Grimacing Pain Intervention(s): Monitored during session                                                          Frequency  Min 1X/week        Progress Toward Goals  OT Goals(current goals can now be found in the care plan section)  Progress towards OT goals: Progressing toward goals  Acute Rehab OT Goals OT Goal Formulation: With patient Time For Goal Achievement: 03/07/23 Potential to Achieve Goals: Fair   Plan      Co-evaluation                 AM-PAC OT "6 Clicks" Daily Activity     Outcome Measure   Help from another person eating meals?: A Lot Help from another person taking care of personal grooming?: A Little Help from another person toileting, which includes using toliet, bedpan, or urinal?: Total Help from another person bathing (including washing, rinsing, drying)?: A Lot Help from another person to put on and taking off regular upper body clothing?: A Lot Help from another person to put on and taking off regular lower body clothing?: Total 6 Click Score: 11    End of Session Equipment Utilized During Treatment: Oxygen  OT Visit Diagnosis: Unsteadiness on feet (R26.81);Other abnormalities of gait and mobility (R26.89);Muscle weakness (generalized) (M62.81);Other symptoms and signs involving cognitive function   Activity Tolerance Patient limited by fatigue   Patient Left in bed;with call bell/phone within reach   Nurse Communication Mobility status        Time: 1333-1350 OT Time Calculation (min): 17 min  Charges: OT General Charges $OT Visit: 1 Visit OT Treatments $Self Care/Home Management : 8-22 mins  03/03/2023  RP, OTR/L  Acute Rehabilitation Services  Office:  [phone]   Suzanna Obey 03/03/2023, 1:55 PM

## 2023-03-04 ENCOUNTER — Inpatient Hospital Stay (HOSPITAL_COMMUNITY): Payer: BC Managed Care – PPO

## 2023-03-04 DIAGNOSIS — I4891 Unspecified atrial fibrillation: Secondary | ICD-10-CM | POA: Diagnosis not present

## 2023-03-04 DIAGNOSIS — J9601 Acute respiratory failure with hypoxia: Secondary | ICD-10-CM | POA: Diagnosis not present

## 2023-03-04 DIAGNOSIS — R570 Cardiogenic shock: Secondary | ICD-10-CM | POA: Diagnosis not present

## 2023-03-04 LAB — GLUCOSE, CAPILLARY
Glucose-Capillary: 87 mg/dL (ref 70–99)
Glucose-Capillary: 95 mg/dL (ref 70–99)
Glucose-Capillary: 86 mg/dL (ref 70–99)
Glucose-Capillary: 91 mg/dL (ref 70–99)
Glucose-Capillary: 91 mg/dL (ref 70–99)
Glucose-Capillary: 99 mg/dL (ref 70–99)

## 2023-03-04 MED ORDER — FAMOTIDINE 20 MG PO TABS: 20.0000 mg | ORAL_TABLET | Freq: Every day | ORAL | Status: DC

## 2023-03-04 MED ORDER — ACETAMINOPHEN 325 MG PO TABS: 650.0000 mg | ORAL_TABLET | Freq: Four times a day (QID) | ORAL | Status: DC | PRN

## 2023-03-04 MED ORDER — SENNOSIDES-DOCUSATE SODIUM 8.6-50 MG PO TABS: 2.0000 | ORAL_TABLET | Freq: Every evening | ORAL | Status: DC | PRN

## 2023-03-04 MED ORDER — RENA-VITE PO TABS: 1.0000 | ORAL_TABLET | Freq: Every day | ORAL | Status: DC

## 2023-03-04 MED ORDER — POLYETHYLENE GLYCOL 3350 17 G PO PACK: 17.0000 g | PACK | Freq: Every day | ORAL | Status: DC | PRN

## 2023-03-04 MED ORDER — MELATONIN 5 MG PO TABS
5.0000 mg | ORAL_TABLET | Freq: Every day | ORAL | Status: DC
  Administered 2023-03-04 – 2023-03-06 (×3): 5 mg
  Filled 2023-03-04 (×3): qty 1

## 2023-03-04 MED ORDER — SENNOSIDES-DOCUSATE SODIUM 8.6-50 MG PO TABS: 1.0000 | ORAL_TABLET | Freq: Every day | ORAL | Status: DC

## 2023-03-04 MED ORDER — RENA-VITE PO TABS
1.0000 | ORAL_TABLET | Freq: Every day | ORAL | Status: DC
  Administered 2023-03-04 – 2023-03-06 (×3): 1
  Filled 2023-03-04 (×3): qty 1

## 2023-03-04 MED ORDER — NEPRO/CARBSTEADY PO LIQD
237.0000 mL | Freq: Three times a day (TID) | ORAL | Status: DC
  Administered 2023-03-04 – 2023-03-06 (×3): 237 mL

## 2023-03-04 MED ORDER — DOCUSATE SODIUM 100 MG PO CAPS: 100.0000 mg | ORAL_CAPSULE | Freq: Two times a day (BID) | ORAL | Status: DC | PRN

## 2023-03-04 MED ORDER — JUVEN PO PACK
1.0000 | PACK | Freq: Two times a day (BID) | ORAL | Status: DC
  Administered 2023-03-05 – 2023-03-06 (×2): 1
  Filled 2023-03-04 (×2): qty 1

## 2023-03-04 MED ORDER — MIDODRINE HCL 5 MG PO TABS
30.0000 mg | ORAL_TABLET | Freq: Three times a day (TID) | ORAL | Status: DC
  Administered 2023-03-04 – 2023-03-06 (×5): 30 mg
  Filled 2023-03-04 (×5): qty 6

## 2023-03-04 MED ORDER — APIXABAN 5 MG PO TABS
5.0000 mg | ORAL_TABLET | Freq: Two times a day (BID) | ORAL | Status: DC
  Administered 2023-03-04 – 2023-03-06 (×5): 5 mg
  Filled 2023-03-04 (×4): qty 1

## 2023-03-04 MED ORDER — CHLORHEXIDINE GLUCONATE CLOTH 2 % EX PADS
6.0000 | MEDICATED_PAD | Freq: Every day | CUTANEOUS | Status: DC
  Administered 2023-03-05 – 2023-03-21 (×16): 6 via TOPICAL

## 2023-03-04 MED ORDER — SENNOSIDES-DOCUSATE SODIUM 8.6-50 MG PO TABS
1.0000 | ORAL_TABLET | Freq: Every day | ORAL | Status: DC
  Administered 2023-03-04 – 2023-03-06 (×3): 1
  Filled 2023-03-04 (×3): qty 1

## 2023-03-04 MED ORDER — MELATONIN 5 MG PO TABS: 5.0000 mg | ORAL_TABLET | Freq: Every day | ORAL | Status: DC

## 2023-03-04 MED ORDER — METOPROLOL TARTRATE 12.5 MG HALF TABLET
12.5000 mg | ORAL_TABLET | Freq: Once | ORAL | Status: AC
  Administered 2023-03-04: 12.5 mg via ORAL
  Filled 2023-03-04: qty 1

## 2023-03-04 MED ORDER — GUAIFENESIN-DM 100-10 MG/5ML PO SYRP: 5.0000 mL | ORAL_SOLUTION | ORAL | Status: DC | PRN

## 2023-03-04 MED ORDER — DOCUSATE SODIUM 50 MG/5ML PO LIQD: 100.0000 mg | Freq: Two times a day (BID) | ORAL | Status: DC | PRN

## 2023-03-04 MED ORDER — ACETAMINOPHEN 160 MG/5ML PO SOLN
650.0000 mg | Freq: Four times a day (QID) | ORAL | Status: DC | PRN
  Administered 2023-03-04: 650 mg
  Filled 2023-03-04: qty 20.3

## 2023-03-04 MED ORDER — OXYCODONE HCL 5 MG PO TABS
5.0000 mg | ORAL_TABLET | ORAL | Status: DC | PRN
  Administered 2023-03-04 – 2023-03-06 (×5): 10 mg
  Filled 2023-03-04 (×5): qty 2

## 2023-03-04 MED ORDER — FAMOTIDINE 20 MG PO TABS
20.0000 mg | ORAL_TABLET | Freq: Every day | ORAL | Status: DC
  Administered 2023-03-05: 20 mg
  Filled 2023-03-04: qty 1

## 2023-03-04 MED ORDER — JUVEN PO PACK: 1.0000 | PACK | Freq: Two times a day (BID) | ORAL | Status: DC

## 2023-03-04 NOTE — Progress Notes (Signed)
IMTS to assume care at [facility] 08/10

## 2023-03-04 NOTE — Progress Notes (Signed)
eLink Physician-Brief Progress Note Patient Name: Phillip Young DOB: Mar 09, 1986 MRN: [id number]   Date of Service  03/04/2023  HPI/Events of Note  Weighted feeding catheter is noted within the stomach. The catheter is coiled upon itself in the distal stomach.  eICU Interventions  Ok to use feeding tube     Intervention Category Intermediate Interventions: Diagnostic test evaluation  Darl Pikes 03/04/2023, 10:54 PM

## 2023-03-04 NOTE — Progress Notes (Signed)
Wound care completed per orders.

## 2023-03-04 NOTE — Progress Notes (Signed)
Patient repeatedly removing monitoring equipment (electrodes, Sp02, and BP cuff) despite education.

## 2023-03-04 NOTE — Progress Notes (Signed)
[facility] [phone] [facility] Liaison note:  This patient is currently enrolled in [facility] outpatient-based palliative care. Hospital Liaison will continue to follow for discharge disposition. Please call for any outpatient based palliative care related questions or concerns. Thank you, Thea Gist BSN, RN [phone]

## 2023-03-04 NOTE — Progress Notes (Signed)
eLink Physician-Brief Progress Note Patient Name: Phillip Young DOB: January 24, 1986 MRN: [id number]   Date of Service  03/04/2023  HPI/Events of Note  RN reports patient pulled cortrak partially out, bridle still in place.  TF running.  Tube was advanced but now asking for xray to confirm position  eICU Interventions  Abdominal xray ordered     Intervention Category Intermediate Interventions: Other:  Darl Pikes 03/04/2023, 9:37 PM

## 2023-03-04 NOTE — Progress Notes (Addendum)
[facility] NEPHROLOGY PROGRESS NOTE  Subjective:  on HFNC 6 lpm today. 3.1 L UF w/ HD yesterday. 177kg was last wt   Objective Vital signs in last 24 hours: Vitals:   02/14/23 1600 02/14/23 1615 02/14/23 1630 02/14/23 1645  BP:      Pulse:      Resp: (!) 43 (!) 38 (!) 35 (!) 31  Temp: 98.1 F (36.7 C)     TempSrc: Axillary     SpO2: 100%     Weight:      Height:         Physical Exam: chronically ill looking male, [place] O2 Heart:RRR, s1s2 nl Lungs:coarse breath sound b/l Abdomen: soft, +bs Extremities: 1+ bilat hip edema  Dialysis Access: RIJ TDC intact, LIJ temp HD catheter running crrt   OP HD: [facility] TTS  4h   180kg  425/1.5  TDC  Na citrate cath lock (no heparin) - last OP HD was 6/25, post wt 193.8kg - consistently left HD 8-12kg up at least the prior 3 wks - sensipar 60mg  three times per week - hectorol 4 mcg IV three times per week - mircera 200 mcg  IV q 2 wks, last 6/13, due 6/27    Assessment/ Plan: # Acute/ chronic hypotension - came off pressors 8/03, cont midodrine 30 tid.   # Pneumococcal bacteremia: admitting diagnosis w/ septic shock. Complex course. Last blood culture 7/23 negative.  Off abx now.   # Acute hypoxic and hypercapnic respiratory failure: extubated on 7/20.  Stable on 6 L HFNC now.   # ESRD - usual HD is TTS. Required emergent HD on 6/29 because of fluid overload.  Then developed shock and afib/ RVR requiring high doses of pressors.  Started CRRT on 6/29. 8/2 required temp HD catheter placement after [facility] malfunctioned. IR placed new [facility] 8/06. CRRT dc'd 8/07 since off pressors and bp's stable. Had iHD yesterday. Next HD tomorrow.   # Anemia of CKD:  on max ESA with variable Hbs, generally in the 9s.   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 Have resumed IV hectorol (half dose) since Ca++ levels are back to normal.   # Volume - mild LE edema, wt's down 177 . Max UF 3 L w/ HD tomorrow. Need to keep his volume under control, e.g.  ideal wt between 173- 176kg perhaps  # Acute on chronic systolic CHF, nonischemic: EF 15-40%.  #A-fib with RVR with hypotension: S/P cardioversion 7/5 -> NSR -> afib RVR 7/6 early AM refractory to [facility] x3 + amiodarone. Persistent afib/flutter w/ HR > 100   Rob   MD  [facility] 03/04/2023, 8:59 AM  Recent Labs  Lab 03/01/23 0327 03/01/23 1723 03/02/23 0515 03/02/23 1301 03/03/23 0422 03/04/23 0419  HGB 9.2*  --   --  12.6*  --   --   ALBUMIN  --    < > 2.7*  --   --  2.8*  CALCIUM  --    < > 10.0  --  10.7* 10.7*  PHOS  --    < > 2.3*  --  3.5 3.6  CREATININE  --    < > 2.14*  --  3.52* 3.60*  K 3.8   < > 3.9 4.1 4.1 3.9   < > = values in this interval not displayed.    Inpatient medications:  apixaban  5 mg Oral BID   Chlorhexidine Gluconate Cloth  6 each Topical Q0600   Chlorhexidine Gluconate Cloth  6 each Topical Q0600   cholecalciferol  1,000 Units Oral Daily   darbepoetin (ARANESP) injection - DIALYSIS  200 mcg Subcutaneous Q Sat-1800   doxercalciferol  2 mcg Intravenous Q T,Th,Sa-HD   famotidine  20 mg Per Tube Daily   feeding supplement (NEPRO CARB STEADY)  237 mL Oral TID BM   feeding supplement (PROSource TF20)  60 mL Per Tube QID   feeding supplement (VITAL 1.5 CAL)  900 mL Per Tube Q24H   insulin aspart  0-9 Units Subcutaneous Q4H   lidocaine  1 patch Transdermal Q24H   melatonin  5 mg Per Tube QHS   midodrine  30 mg Oral TID WC   multivitamin  1 tablet Per Tube QHS   nutrition supplement (JUVEN)  1 packet Per Tube BID BM   mouth rinse  15 mL Mouth Rinse 4 times per day    sodium chloride 10 mL/hr at 02/21/23 1444   anticoagulant sodium citrate     sodium chloride, acetaminophen **OR** acetaminophen, anticoagulant sodium citrate, bisacodyl, docusate, guaiFENesin-dextromethorphan, hydrocortisone cream, iohexol, ondansetron (ZOFRAN) IV, mouth rinse, oxyCODONE, polyethylene glycol, polyvinyl alcohol, senna-docusate, surgical lubricant

## 2023-03-04 NOTE — Progress Notes (Signed)
Occupational Therapy Treatment Patient Details Name: Phillip Young MRN: [id number] DOB: 05-06-1986 Today's Date: 03/04/2023   History of present illness Pt is a 37 y/o M presenting to ED on 6/28 with fever, joint pain, and SOB after missing HD. Admitted for acute hypoxic respiratory failure. Intubated and started on CRRT, vasopressor support on 6/30. Extubated 7/20. Underwent DCCV 7/5, 7/6, and 7/7 without success. Pt off CRRT on 8/7.  PMH includes ESRD, NICM, refractory A fib, HFrEF   OT comments  Patient with increasing c/o pain to his B feet and ankles.  Patient not tolerating touch to legs for repositioning and could not tolerate his feet being against the footboard to attempt tilting.  Patient agreeable to bed mobility and still requiring near total assist to R and +2 to roll left.  OT will continue efforts in the acute setting to address deficits, and Patient will benefit from continued inpatient follow up therapy, <3 hours/day       If plan is discharge home, recommend the following:  Two people to help with walking and/or transfers;Two people to help with bathing/dressing/bathroom;Assistance with cooking/housework;Assistance with feeding;Direct supervision/assist for medications management;Direct supervision/assist for financial management;Help with stairs or ramp for entrance;Assist for transportation   Equipment Recommendations  Hospital bed;Wheelchair cushion (measurements OT);Wheelchair (measurements OT)    Recommendations for Other Services      Precautions / Restrictions Precautions Precautions: Fall Precaution Comments: NGT, multiple lines, wounds on hip/back/buttocks Restrictions Weight Bearing Restrictions: No       Mobility Bed Mobility Overal bed mobility: Needs Assistance Bed Mobility: Rolling Rolling: Max assist, +2 for physical assistance           Patient Response: Cooperative  Transfers                   General transfer comment: Attepted tilt  bed, patient not able to tolerate his feet on the footboard.     Balance                                           ADL either performed or assessed with clinical judgement   ADL                                              Extremity/Trunk Assessment Upper Extremity Assessment Upper Extremity Assessment: Generalized weakness RUE Deficits / Details: unable to flex shoulder without assists, elbow/wrist/hand ROM 3/5 RUE Coordination: decreased fine motor;decreased gross motor   Lower Extremity Assessment Lower Extremity Assessment: Defer to PT evaluation        Vision       Perception     Praxis      Cognition Arousal: Alert Behavior During Therapy: Flat affect Overall Cognitive Status: Within Functional Limits for tasks assessed                                                       General Comments  HR 104 with rolling    Pertinent Vitals/ Pain       Pain Assessment Pain Assessment: Faces Faces Pain Scale: Hurts little more Pain Location: buttocks ,  back. B feet/ankles Pain Descriptors / Indicators: Discomfort, Aching, Grimacing Pain Intervention(s): Monitored during session                                                          Frequency  Min 1X/week        Progress Toward Goals  OT Goals(current goals can now be found in the care plan section)  Progress towards OT goals: Progressing toward goals  Acute Rehab OT Goals OT Goal Formulation: With patient Time For Goal Achievement: 03/18/23 Potential to Achieve Goals: Fair ADL Goals Pt Will Perform Grooming: with set-up;bed level  Plan      Co-evaluation                 AM-PAC OT "6 Clicks" Daily Activity     Outcome Measure   Help from another person eating meals?: A Lot Help from another person taking care of personal grooming?: A Little Help from another person toileting, which includes using toliet,  bedpan, or urinal?: Total Help from another person bathing (including washing, rinsing, drying)?: A Lot Help from another person to put on and taking off regular upper body clothing?: A Lot Help from another person to put on and taking off regular lower body clothing?: Total 6 Click Score: 11    End of Session Equipment Utilized During Treatment: Oxygen  OT Visit Diagnosis: Unsteadiness on feet (R26.81);Other abnormalities of gait and mobility (R26.89);Muscle weakness (generalized) (M62.81);Other symptoms and signs involving cognitive function   Activity Tolerance Patient limited by pain   Patient Left in bed;with call bell/phone within reach;with family/visitor present   Nurse Communication Mobility status        Time: 1630-1650 OT Time Calculation (min): 20 min  Charges: OT General Charges $OT Visit: 1 Visit OT Treatments $Self Care/Home Management : 8-22 mins  03/04/2023  RP, OTR/L  Acute Rehabilitation Services  Office:  [phone]   Phillip Young 03/04/2023, 5:06 PM

## 2023-03-04 NOTE — Hospital Course (Addendum)
He thinks pain is not controlled.   08/28: Doing alright this morning. No concerns, besides the pain in his feet. Still describing it as "ice-fire" pain. Refused dialysis this morning because his family members are coming to visit and he "cannot miss" them coming today. Says he spoke with doctor foster about this who was "quite mean" about this. Says that this is just "one time". Patient reports that his medical condition including his K and vitals are fine and that he isn't concerned about one missed HD session. We discussed the patient pain with him, talking about how neuropathy can be a difficult pain to control and how this will be difficult to manage.  08/27: Patient reporting pain is well controlled on oxy 10.  He is breathing well and has no concerns with his respiration. He liked going for dialysis late in the afternoon yesterday. Says he has not received his pain meds yet today and wanted heating pad for his foot pain. We reached out to nursing for this.   08/26 pain of the feet. "Icey-fire" from the knee into the toes. Lasting about half an hour. Feel satrted with the ifx of his butt. Says he usually pees normally unless he is sick. When he isn't feeling well his urine "truns brown". Interested in getting wound vac for his L sided pressure ulcer. We will have wound care come by? Off abx since. Cefazolin 08.15 stoppd, Cefepime  08/18. Ceftriazone 08/14. Vanc 08/14   Still having pain. Majority for the feet. Would like to try different pain medications. Says Oxycodone works better than hydromorphone. Wants to do oxycodone 15mg  , we would prefer to start 10mg , discussed with patient reasons and indications for this.   This patient was admitted to [facility] on 6/28 due to sepsis, AFRVR, and hypoxic respiratory failure influenced by HFrEF, nonadherence to dialysis, and excessive fluid intake. Cardiology was consulted for his Afib with limited treatment options in setting of amiodarone lung toxicity.  He required ICU care with intubation on the second day of admission with worsening mixed septic and cardiogenic shock. Later blood cultures revealed pneumococcal bacteremia treated appropriately. Despite multiple failed cardioversions he converted to sinus rhythm and sepsis improved. He was extubated on 7/20. He improved sufficiently to leave the ICU on 8/9.     #Acute on chronic HFrEF with pulmonary edema #Paroxysmal Afib/flutter with RVR #Nonischemic cardiomyopathy EF less than 20% Patient has long history of heart failure with ejection fraction less than 20%. He initially presented volume overloaded with severe pulmonary edema. He is intolerant of many rate controlling medications, due to allergy, toxicity, or failing them in the past. Many unsuccessful attempted cardioversions during stay. Cardiology service was consulted and recommended digoxin for rate control. He has remained in and out of Afib while hospitalized with heart rates ranging up to the 150s, with improvement towards normal as he approached discharge and otherwise hemodynamically stable.    #Acute on chronic hypoxemic/hypercapnic respiratory failure #Acute metabolic encephalopathy in setting of hypercapnia Patient presented acutely volume overloaded with severe pulmonary edema, altered mental status, and respiratory failure requiring intubation and stay in the ICU for over a month. He was able to be extubated and transitioned to the floor where he was maintained on high levels of supplemental oxygen by nasal cannula.    #ESRD on HD T/Th/S #Recurrent metabolic acidosis, improving Patient with history of ESRD, requiring emergent HD on 6/29 because of fluid overload, started CRRT on the same day. Patient was eventually transitioned off  CRRT and has been able to resume HD on T/Th/S. Patient has had multiple problems with his tunneled dialysis catheter related to clotting and fibrin sheathes. IR replaced TDC on 8/14. A line holiday was  requested due to new bacteremia, though patient could not tolerate interrupting dialysis schedule. Patient has required haldol during dialysis sessions due to agitation and pain from skin breakdown.    #Bacteremia Blood cultures on 8/15 positive for enterobacter cloacae. WBC count resolved to normal limits. Unable to tolerate line holiday given needs for dialysis. Repeat blood cultures showing no growth. Treated with cefepime starting 8/14, transitioned to levaquin 8/20, abx stopped 8/23   #Mixed Septic and Cardiogenic Shock Patient initially admitted for pneumonia with hemodynamic instability, severe leukocytosis, elevated lactate, CXR with vascular crowding. Patient was intubated and treated in the ICU with pressors and broad spectrum antibiotics. Blood cultures were positive for pneumococcal bacteremia. Course was complicated by cardiogenic shock in setting of history of recurrent Afib with RVR. Multiple failed attempts to cardiovert. Sepsis and cardiogenic shock responded appropriately to antibiotics, pressors and CRRT and patient was extubated on 7/20. He was able to be transferred to the floor on 8/9.    #Hypervolemic hyponatremia #Hyperphosphatemia #Hyperkalemia #Hypocalcemia Patient developed multiple electrolyte derangements in setting of ESRD with intermittent dialysis completion. Electrolytes were monitored with daily BMP. Hyperkalemia was a recurrent problem with multiple doses of lokelma, insulin/D50, albuterol, calcium gluconate given for cardiac stability.    #Anemia of chronic disease Patient anemia at baseline due to ESRD and multiple comorbidities. Hemoglobin monitored with daily labs.    #Skin breakdown Perineal and sacral skin breakdown noted, often making dialysis sessions difficult for patient to tolerate. Wound care consulted, local woundcare performed.

## 2023-03-04 NOTE — Progress Notes (Signed)
NAME:  Phillip Young, MRN:  [id number], DOB:  June 28, 1986, LOS: 42 ADMISSION DATE:  01/21/2023, CONSULTATION DATE:  01/22/23 REFERRING MD:  Danise Edge, CHIEF COMPLAINT:  SOB   History of Present Illness:  37 year old man well known to the PCCM and AHF services who presented to [facility] 6/28 with fevers, joint pains, SOB. PMHx significant for ESRD, NICM, refractory AFib, ?amio-induced lung toxicity. Patient reportedly missed HD on Thursday (has a history of leaving HD early). Reportedly ~10kg over dry weight with worsening breathing status, AFib/RVR and borderline BP.  PCCM consulted for ICU admission/further evaluation and management.  Pertinent Medical History:   Past Medical History:  Diagnosis Date   Acute on chronic respiratory failure with hypoxia ([name]) 04/21/2021   Acute on chronic systolic (congestive) heart failure ([name]) 02/26/2020   Amiodarone toxicity    Anemia    Atrial flutter ([name])    Biventricular congestive heart failure ([name])    Chronic hypoxemic respiratory failure ([name])    Class 3 severe obesity due to excess calories with serious comorbidity and body mass index (BMI) of 50.0 to 59.9 in adult [facility]) 02/26/2020   Essential hypertension 02/26/2020   GERD without esophagitis 02/26/2020   Hidradenitis suppurativa 02/26/2020   NICM (nonischemic cardiomyopathy) ([name])    Obesity hypoventilation syndrome ([name])    OSA (obstructive sleep apnea)    PAF (paroxysmal atrial fibrillation) ([name])    Pneumonia    Prediabetes 02/26/2020   Significant Hospital Events: Including procedures, antibiotic start and stop dates in addition to other pertinent events   6/28 Admit, emergent HD 6/29 Afib/RVR, shock, ICU transfer 6/30 Intubated and placed on vasopressor support and started on CRRT 7/1 Remains intubated on CRRT continue with pressor support 7/2 Agitated with SAT, remains on CRRT and low dose levophed 7/3 low dose NE, ongoing CRRT 7/4 Attempted SBT this am, apneic but severely agitated,  desaturated, HR up 130/140s.  Re-sedated but HR remains 140's, NE up from 6 to . NSR on EKG.  Was pulling 50-100on UF, since kept even for now.  CVP 8 7/5 DCCV again attempted this a.m. with success, remains on norepinephrine and vasopressin.  7/6 went into A-fib with RVR, attempted DCCV x 3, started on amiodarone infusion after amiodarone bolus 7/7 Multiple DCCV attempts without success. TF held due to vomiting. Tx denied to [facility]  7/8 KUB looked OK. No further vomiting. Tolerating TF. Large BM overnight.  7/9 vasopressors back up, remains rapid afib/ flutter, Bmx 1, wua causing hemodynamic instability 7/10 weaned sedation on mothers request, HR remains 140-220s, tmax 100.6, DNR, no escalation.  Heart failure signed off, not a candidate for advanced heart failure therapies at this time 7/12 [facility] denied transfer, CODE STATUS changed from DNR back to full code 7/13 Amiodarone restarted with ESR trend  7/16 Amio stopped with no improvement see in rate control  7/20 extubated 7/22 Issues with agitation post extubation, improved with Precedex. Tmax 101, respiratory culture with moderate yeast and fluconazole started 7/23 ID consulted  7/24 ID stopped fluconazole and started Daptomycin for fever of unknown origin  7/25 Intermittent BIPAP need 7/28 Remains on vasopressor support with Levophed and vasopressin. Refractory A-fib with heart rate in 140s to 160s 7/29 CVC dislodged during attempts to ambulate, CRRT stopped and central access utilized for both levo and vaso.  PICC line order placed 7/30 PICC placed overnight, back on CRRT and levophed/.vaso and CRRT 7/31 no changes, Dr. Denese Killings starting Digoxin 8/5 Now off Dig and amiodarone, in sinus rhythm and off  pressors  8/6 back on low dose levophed  8/7 off pressors, tunneled HD catheter was exchanged   Interim History / Subjective:  Remain off vasopressors Tolerated IHD yesterday Remain in A-fib with a heart rate ranging between 120-160 On  2 L nasal cannula oxygen, maintaining O2 sat above 95% Wore BiPAP  last night  Objective:  Blood pressure (!) 131/91, pulse (!) 139, temperature (!) 96.6 F (35.9 C), temperature source Axillary, resp. rate 17, height 6' (1.829 m), weight (!) 177.4 kg, SpO2 94%. CVP:  [6 mmHg-42 mmHg] 12 mmHg  Vent Mode: BIPAP;PCV FiO2 (%):  [40 %] 40 % Set Rate:  [15 bmp] 15 bmp PEEP:  [10 cmH20] 10 cmH20   Intake/Output Summary (Last 24 hours) at 03/04/2023 0949 Last data filed at 03/04/2023 0900 Gross per 24 hour  Intake 2570 ml  Output 3147.12 ml  Net -577.12 ml   Filed Weights   03/03/23 1549 03/03/23 1844 03/04/23 0500  Weight: (!) 183.1 kg (!) 180 kg (!) 177.4 kg    Physical exam: General: [name], lying on the bed HEENT: [place]/AT, eyes anicteric.  moist mucus membranes Neuro: Alert, awake following commands Chest: Reduced air entry at the bases bilaterally, no wheezes or rhonchi Heart: Irregularly irregular, tachycardic, no murmurs or gallops Abdomen: Soft, nontender, nondistended, bowel sounds present Skin: No rash   [place] 67%  Resolved Hospital Problem List:  Hematuria due to traumatic Foley insertion Pneumococcal bacteremia Fever of unknown origin  Bilateral multifocal pneumonia Mixed septic and cardiogenic shock  Assessment & Plan:  Acute-on-chronic hypoxemic/hypercapnic respiratory failure OHS/OSA Acute metabolic encephalopathy in the setting of hypercapnia Noncompliant with CPAP at baseline Uses BiPAP at night Currently on 2 L nasal cannula oxygen with O2 sat in mid 90s Encourage incentive spirometry and PT/OT Mental status has improved, awake and following commands  Acute on chronic HFrEF with pulmonary edema Paroxysmal A-fib/flutter with RVR Nonischemic cardiomyopathy EF less than 20% Remain A-fib with RVR overnight, heart rate ranging between 120-160 Monitor intake and output Continue Eliquis for stroke prophylaxis Will try low  dose metoprolol  End-stage renal disease on hemodialysis Recurrent metabolic acidosis, improving Metabolic bone disease  Hyper/Hypocalcemia  Nephrology following, appreciate assistance Tolerated IHD yesterday He is on midodrine 30 tid  Hypervolemic hyponatremia Hyperphosphatemia Hyperkalemia  Hypocalcemia Closely monitor electrolytes and supplement  Anemia of chronic disease Monitor H&H and transfuse if less than 7  Skin breakdown, not POA Unstageable perineum and sacral skin breakdown, pt refusing rectal tube Continue local wound care   Best Practice (right click and "Reselect all SmartList Selections" daily)   Diet/type: regular  DVT prophylaxis: DOAC,  GI prophylaxis: Famotidine Lines: Yes still needed, PICC, tunneled HD catheter Foley: n/a Code Status:  Full Last date of multidisciplinary goals of care: 8/5 with patient and mother,  continue full code and full scope of care, would want repeat intubation if absolutely needed     Cheri Fowler, MD [facility] See Amion for pager If no response to pager, please call [phone] until 7pm After 7pm, Please call E-link [phone]

## 2023-03-05 LAB — CBC
HCT: 31.1 % — ABNORMAL LOW (ref 39.0–52.0)
Hemoglobin: 9.6 g/dL — ABNORMAL LOW (ref 13.0–17.0)
MCH: 32.4 pg (ref 26.0–34.0)
MCHC: 30.9 g/dL (ref 30.0–36.0)
MCV: 105.1 fL — ABNORMAL HIGH (ref 80.0–100.0)
Platelets: 361 10*3/uL (ref 150–400)
RBC: 2.96 MIL/uL — ABNORMAL LOW (ref 4.22–5.81)
RDW: 18.3 % — ABNORMAL HIGH (ref 11.5–15.5)
WBC: 8.8 10*3/uL (ref 4.0–10.5)
nRBC: 0.3 % — ABNORMAL HIGH (ref 0.0–0.2)

## 2023-03-05 LAB — GLUCOSE, CAPILLARY
Glucose-Capillary: 100 mg/dL — ABNORMAL HIGH (ref 70–99)
Glucose-Capillary: 85 mg/dL (ref 70–99)
Glucose-Capillary: 94 mg/dL (ref 70–99)
Glucose-Capillary: 94 mg/dL (ref 70–99)
Glucose-Capillary: 98 mg/dL (ref 70–99)

## 2023-03-05 LAB — COMPREHENSIVE METABOLIC PANEL WITH GFR
ALT: 26 U/L (ref 0–44)
AST: 30 U/L (ref 15–41)
Albumin: 2.8 g/dL — ABNORMAL LOW (ref 3.5–5.0)
Alkaline Phosphatase: 252 U/L — ABNORMAL HIGH (ref 38–126)
Anion gap: 17 — ABNORMAL HIGH (ref 5–15)
BUN: 110 mg/dL — ABNORMAL HIGH (ref 6–20)
CO2: 21 mmol/L — ABNORMAL LOW (ref 22–32)
Calcium: 10.6 mg/dL — ABNORMAL HIGH (ref 8.9–10.3)
Chloride: 93 mmol/L — ABNORMAL LOW (ref 98–111)
Creatinine, Ser: 5.25 mg/dL — ABNORMAL HIGH (ref 0.61–1.24)
GFR, Estimated: 14 mL/min — ABNORMAL LOW
Glucose, Bld: 103 mg/dL — ABNORMAL HIGH (ref 70–99)
Potassium: 4.1 mmol/L (ref 3.5–5.1)
Sodium: 131 mmol/L — ABNORMAL LOW (ref 135–145)
Total Bilirubin: 0.8 mg/dL (ref 0.3–1.2)
Total Protein: 7.2 g/dL (ref 6.5–8.1)

## 2023-03-05 MED ORDER — HYDROXYZINE HCL 10 MG PO TABS
10.0000 mg | ORAL_TABLET | Freq: Every day | ORAL | Status: DC | PRN
  Administered 2023-03-05 – 2023-03-23 (×7): 10 mg via ORAL
  Filled 2023-03-05 (×7): qty 1

## 2023-03-05 MED ORDER — ALBUMIN HUMAN 25 % IV SOLN
INTRAVENOUS | Status: AC
  Filled 2023-03-05: qty 100

## 2023-03-05 MED ORDER — HALOPERIDOL LACTATE 5 MG/ML IJ SOLN
1.0000 mg | Freq: Two times a day (BID) | INTRAMUSCULAR | Status: DC | PRN
  Filled 2023-03-05 (×2): qty 0.2

## 2023-03-05 MED ORDER — ANTICOAGULANT SODIUM CITRATE 4% (200MG/5ML) IV SOLN
5.0000 mL | Status: DC | PRN
  Filled 2023-03-05: qty 5

## 2023-03-05 MED ORDER — ANTICOAGULANT SODIUM CITRATE 4% (200MG/5ML) IV SOLN: 5.0000 mL | Status: DC | PRN

## 2023-03-05 MED ORDER — ALTEPLASE 2 MG IJ SOLR
2.0000 mg | Freq: Once | INTRAMUSCULAR | Status: AC | PRN
  Administered 2023-03-05 – 2023-03-08 (×2): 2 mg
  Filled 2023-03-05 (×2): qty 2

## 2023-03-05 MED ORDER — ALTEPLASE 2 MG IJ SOLR
INTRAMUSCULAR | Status: AC
  Filled 2023-03-05: qty 2

## 2023-03-05 NOTE — Plan of Care (Signed)
  Problem: Education: Goal: Knowledge of General Education information will improve Description: Including pain rating scale, medication(s)/side effects and non-pharmacologic comfort measures Outcome: Progressing   Problem: Health Behavior/Discharge Planning: Goal: Ability to manage health-related needs will improve Outcome: Progressing   Problem: Clinical Measurements: Goal: Ability to maintain clinical measurements within normal limits will improve Outcome: Progressing Goal: Will remain free from infection Outcome: Progressing Goal: Diagnostic test results will improve Outcome: Progressing Goal: Respiratory complications will improve Outcome: Progressing Goal: Cardiovascular complication will be avoided Outcome: Progressing   Problem: Activity: Goal: Risk for activity intolerance will decrease Outcome: Progressing   Problem: Nutrition: Goal: Adequate nutrition will be maintained Outcome: Progressing   Problem: Coping: Goal: Level of anxiety will decrease Outcome: Progressing   Problem: Elimination: Goal: Will not experience complications related to bowel motility Outcome: Progressing   Problem: Pain Managment: Goal: General experience of comfort will improve Outcome: Progressing   Problem: Skin Integrity: Goal: Risk for impaired skin integrity will decrease Outcome: Progressing   Problem: Education: Goal: Ability to describe self-care measures that may prevent or decrease complications (Diabetes Survival Skills Education) will improve Outcome: Progressing Goal: Individualized Educational Video(s) Outcome: Progressing   Problem: Coping: Goal: Ability to adjust to condition or change in health will improve Outcome: Progressing   Problem: Fluid Volume: Goal: Ability to maintain a balanced intake and output will improve Outcome: Progressing   Problem: Health Behavior/Discharge Planning: Goal: Ability to identify and utilize available resources and services  will improve Outcome: Progressing Goal: Ability to manage health-related needs will improve Outcome: Progressing   Problem: Metabolic: Goal: Ability to maintain appropriate glucose levels will improve Outcome: Progressing   Problem: Nutritional: Goal: Maintenance of adequate nutrition will improve Outcome: Progressing Goal: Progress toward achieving an optimal weight will improve Outcome: Progressing   Problem: Skin Integrity: Goal: Risk for impaired skin integrity will decrease Outcome: Progressing   Problem: Tissue Perfusion: Goal: Adequacy of tissue perfusion will improve Outcome: Progressing

## 2023-03-05 NOTE — Progress Notes (Signed)
[facility] NEPHROLOGY PROGRESS NOTE  Subjective:  on HFNC 3-5 lpm now. HR 100-110.  BP 115/80   Objective Vital signs in last 24 hours: Vitals:   02/14/23 1600 02/14/23 1615 02/14/23 1630 02/14/23 1645  BP:      Pulse:      Resp: (!) 43 (!) 38 (!) 35 (!) 31  Temp: 98.1 F (36.7 C)     TempSrc: Axillary     SpO2: 100%     Weight:      Height:         Physical Exam: chronically ill looking male, [place] O2 Heart:RRR, s1s2 nl Lungs:coarse breath sound b/l Abdomen: soft, +bs Extremities: 1+ bilat hip edema  Dialysis Access: RIJ TDC intact, LIJ temp HD catheter running crrt   OP HD: [facility] TTS  4h   180kg  425/1.5  TDC  Na citrate cath lock (no heparin) - last OP HD was 6/25, post wt 193.8kg - consistently left HD 8-12kg up at least the prior 3 wks - sensipar 60mg  three times per week - hectorol 4 mcg IV three times per week - mircera 200 mcg  IV q 2 wks, last 6/13, due 6/27    Assessment/ Plan: # Acute/ chronic hypotension - came off pressors 8/03, cont midodrine 30 tid.   # Pneumococcal bacteremia: admitting diagnosis w/ septic shock. Complex course. Last blood culture 7/23 negative.  Off abx now.   # Acute hypoxic and hypercapnic respiratory failure: extubated on 7/20.  Stable on lower dose of O2 at 3-5 lpm HFNC.   # ESRD - usual HD is TTS. Required emergent HD on 6/29 because of fluid overload.  Started CRRT on 6/29 due to shock.  CRRT dc'd 8/07 since off pressors and bp's stable. Next HD today.   # HD access - 8/2 required temp HD catheter placement after [facility] malfunctioned. IR placed new [facility] 8/06.  # Anemia of CKD:  on max ESA with variable Hbs, generally in the 9s.   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 Have resumed IV hectorol (half dose) since Ca++ levels are back to normal.   # Volume - mild LE edema, wt's down 178 . Goal UF 2.75 L w/ HD today. Need to keep his volume under control, e.g. ideal wt maybe between 173- 176kg   # Acute on chronic  systolic CHF, nonischemic: EF 27-25%.  #A-fib with RVR with hypotension: S/P cardioversion 7/5 -> NSR -> afib RVR 7/6 early AM refractory to [facility] x3 + amiodarone. Persistent afib/flutter w/ HR > 100   Rob   MD  [facility] 03/05/2023, 1:18 PM  Recent Labs  Lab 03/02/23 1301 03/03/23 0422 03/04/23 0419 03/05/23 0505  HGB 12.6*  --   --  9.6*  ALBUMIN  --   --  2.8* 2.8*  CALCIUM  --  10.7* 10.7* 10.6*  PHOS  --  3.5 3.6  --   CREATININE  --  3.52* 3.60* 5.25*  K 4.1 4.1 3.9 4.1    Inpatient medications:  apixaban  5 mg Per Tube BID   Chlorhexidine Gluconate Cloth  6 each Topical Q0600   cholecalciferol  1,000 Units Oral Daily   darbepoetin (ARANESP) injection - DIALYSIS  200 mcg Subcutaneous Q Sat-1800   doxercalciferol  2 mcg Intravenous Q T,Th,Sa-HD   famotidine  20 mg Per Tube Daily   feeding supplement (NEPRO CARB STEADY)  237 mL Per Tube TID BM   feeding supplement (PROSource TF20)  60 mL  Per Tube QID   feeding supplement (VITAL 1.5 CAL)  900 mL Per Tube Q24H   insulin aspart  0-9 Units Subcutaneous Q4H   lidocaine  1 patch Transdermal Q24H   melatonin  5 mg Per Tube QHS   midodrine  30 mg Per Tube TID WC   multivitamin  1 tablet Per Tube QHS   nutrition supplement (JUVEN)  1 packet Per Tube BID BM   mouth rinse  15 mL Mouth Rinse 4 times per day   senna-docusate  1 tablet Per Tube QHS    sodium chloride 10 mL/hr at 02/21/23 1444   albumin human     anticoagulant sodium citrate     anticoagulant sodium citrate     anticoagulant sodium citrate     sodium chloride, acetaminophen **OR** acetaminophen (TYLENOL) oral liquid 160 mg/5 mL, albumin human, anticoagulant sodium citrate, anticoagulant sodium citrate, anticoagulant sodium citrate, bisacodyl, docusate sodium **OR** docusate, guaiFENesin-dextromethorphan, hydrocortisone cream, hydrOXYzine, iohexol, ondansetron (ZOFRAN) IV, mouth rinse, oxyCODONE, polyethylene glycol, polyvinyl alcohol, senna-docusate, surgical  lubricant

## 2023-03-05 NOTE — Progress Notes (Addendum)
Received patient in bed.Awake ,alert and oriented.Consent verified.Patient is restless,very uncomfortable in his bed that made his catheter worked not so good.It worked for a while when patient was sleeping.  Medicine given : Hectorol 2 mcg.                            Alteplase  dwelling/locked in both catheter as ordered.   Access used : Right HD catheter that did not worked well as mentioned above.Dressing on date.Arterial access have more positional problem?,Circuit clogged for the first few minutes when the treatment started.Treatment resumed,artrial access have always have a hight pressure ,reverse connection and decreasing blood flow rate to 200 mmHq did not helped at all.TMP pressure were between 44-55 at all time.Placed patient on slight or low T -position helped a little.  Duration of treatment:  1.47 hours  Fluid removed : 800 cc.  Hemo comment:Patient refused to continue/to complete his treatment.He signed AMA.Renal MD made aware of the above events.  Hand off to the patient's nurse.

## 2023-03-05 NOTE — Progress Notes (Addendum)
Subjective:   Summary: Phillip Young is a 37 y.o. year old male currently admitted on the IMTS HD#43 for Acute on chronic hypoxic respiratory failure, ESRD on HD.  Overnight Events: NAEON  Subjective: Saw patient at bedside this AM. Patient reports he is breathing okay, no new pain or shortness of breath. Appeared weak and tremulous. Complained of significant discomfort and team and nurses helped adjust him in bed. Stated he has been very itchy and asked for lotion.   Objective:  Vital signs in last 24 hours: Vitals:   03/04/23 2152 03/05/23 0022 03/05/23 0411 03/05/23 0745  BP:  124/76 (!) 109/95 115/60  Pulse: (!) 115 (!) 117 (!) 39 (!) 125  Resp: 20 (!) 23 (!) 25 20  Temp:  97.8 F (36.6 C) 97.8 F (36.6 C)   TempSrc:  Oral Oral   SpO2: 95% 90% 96% 97%  Weight:   (!) 178 kg   Height:       Supplemental O2: HFNC SpO2: 97 % O2 Flow Rate (L/min): 3 L/min FiO2 (%): 40 %   Physical Exam:  Constitutional: chronically ill appearing, laying in bed, in no acute distress. Tremulous Cardiovascular: RRR, no murmurs, rubs or gallops Pulmonary/Chest: normal work of breathing on room air, lungs clear to auscultation bilaterally Abdominal: soft, non-tender, non-distended Skin: warm and dry Extremities: upper/lower extremity pulses 2+, no lower extremity edema present  Filed Weights   03/03/23 1844 03/04/23 0500 03/05/23 0411  Weight: (!) 180 kg (!) 177.4 kg (!) 178 kg     Intake/Output Summary (Last 24 hours) at 03/05/2023 0922 Last data filed at 03/04/2023 1430 Gross per 24 hour  Intake 832.5 ml  Output --  Net 832.5 ml   Net IO Since Admission: 7,764.36 mL [03/05/23 0922]  Pertinent Labs:    Latest Ref Rng & Units 03/05/2023    5:05 AM 03/02/2023    1:01 PM 03/01/2023    3:27 AM  CBC  WBC 4.0 - 10.5 K/uL 8.8     Hemoglobin 13.0 - 17.0 g/dL 9.6  16.1  9.2   Hematocrit 39.0 - 52.0 % 31.1  37.0  27.0   Platelets 150 - 400 K/uL 361          Latest Ref  Rng & Units 03/05/2023    5:05 AM 03/04/2023    4:19 AM 03/03/2023    4:22 AM  CMP  Glucose 70 - 99 mg/dL 096  97  94   BUN 6 - 20 mg/dL 045  75  72   Creatinine 0.61 - 1.24 mg/dL 4.09  8.11  9.14   Sodium 135 - 145 mmol/L 131  133  133   Potassium 3.5 - 5.1 mmol/L 4.1  3.9  4.1   Chloride 98 - 111 mmol/L 93  94  98   CO2 22 - 32 mmol/L 21  22  23    Calcium 8.9 - 10.3 mg/dL 78.2  95.6  21.3   Total Protein 6.5 - 8.1 g/dL 7.2     Total Bilirubin 0.3 - 1.2 mg/dL 0.8     Alkaline Phos 38 - 126 U/L 252     AST 15 - 41 U/L 30     ALT 0 - 44 U/L 26       Imaging: DG Abd Portable 1V  Result Date: 03/04/2023 CLINICAL DATA:  Check feeding catheter placement EXAM: PORTABLE ABDOMEN - 1 VIEW  COMPARISON:  None Available. FINDINGS: Weighted feeding catheter is noted within the stomach. The catheter is coiled upon itself in the distal stomach. IMPRESSION: Weighted feeding catheter in the distal stomach as described. Electronically Signed   By: Alcide Clever M.D.   On: 03/04/2023 21:57     Assessment/Plan:   Principal Problem:   Acute hypoxic respiratory failure ([name]) Active Problems:   Morbid obesity ([name])   Chronic systolic CHF (congestive heart failure) ([name])   Cardiogenic shock ([name])   ESRD (end stage renal disease) ([name])   ESRD (end stage renal disease) on dialysis ([name])   Hypervolemia   Septic shock ([name])   Atrial fibrillation with RVR ([name])   Pressure injury of skin   Patient Summary: Phillip Young is a 37 y.o. with a pertinent PMH of chronic hypoxic respiratory failure, ESRD, HFrEF with EF 20-25%, refractory Afib, amiodarone induced lung toxicity, who presented with fevers, joint pains, SOB and admitted for sepsis, now resolved.   #Mixed Septic and Cardiogenic Shock - resolved. Transferred out of the ICU on 8/9.   #Acute on chronic hypoxemic/hypercapnic respiratory failure #Acute metabolic encephalopathy in setting of hypercapnia Patient is transferred from ICU after extubation and  pressors discontinued.  -Currently on 3L HFNC with O2 sats in mid 90s -Noncompliant with CPAP at home, uses BiPAP at night -Encourage incentive spirometry and PT/OT -Mental status has improved, awake and following commands  #Acute on chronic HFrEF with pulmonary edema #Paroxysmal Afib/flutter with RVR #Nonischemic cardiomyopathy EF less than 20% -Remains in Afib with RVR, HR ranging up to 150s -Continue Eliquis for stroke prophylaxis -Patient intolerant of metoprolol, will call cardiology for further recommendations for rate control if needed  #ESRD on HD T/Th/S #Recurrent metabolic acidosis, improving -Nephrology following, appreciate assistance -HD scheduled for today -Midodrine 30 TID for hypotension  #Hypervolemic hyponatremia #Hyperphosphatemia #Hyperkalemia #Hypocalcemia -Monitor electrolytes with daily labs, supplement as necessary  #Anemia of chronic disease Monitor with daily labs, transfuse as needed  #Skin breakdown Perineal and sacral skin breakdown. Patient refuses rectal tube -Continue local wound care  Diet: Normal IVF: None,   VTE: DOAC Code: Full PT/OT recs:   Dispo: Anticipated discharge to Skilled nursing facility pending medical stability Monna Fam, MD PGY-1 Internal Medicine Resident Pager Number [phone] Please contact the on call pager after 5 pm and on weekends at [phone].

## 2023-03-05 NOTE — Progress Notes (Signed)
Pt has been restless  in the HD unit today, possibly related to pressure sore he has. Was given vistaril which worked 1-2 hrs but he is agitated again and this is making the [facility] not work well. Also the arterial is not flushing fully well. Will give haldol 1-2 mg IV for agitation and will block the ports w/ TPA post HD.    Vinson Moselle  MD  [facility] 03/05/2023, 4:12 PM

## 2023-03-05 NOTE — Progress Notes (Signed)
Pt just arrived from dialysis back to 4E. HR 118-155. BP 123/98. Or sat 91 on 2L [place]. MD notified.   Kenard Gower, RN

## 2023-03-06 DIAGNOSIS — I4891 Unspecified atrial fibrillation: Secondary | ICD-10-CM | POA: Diagnosis not present

## 2023-03-06 DIAGNOSIS — E872 Acidosis, unspecified: Secondary | ICD-10-CM

## 2023-03-06 DIAGNOSIS — J9622 Acute and chronic respiratory failure with hypercapnia: Secondary | ICD-10-CM | POA: Diagnosis not present

## 2023-03-06 DIAGNOSIS — Z87891 Personal history of nicotine dependence: Secondary | ICD-10-CM

## 2023-03-06 DIAGNOSIS — I428 Other cardiomyopathies: Secondary | ICD-10-CM

## 2023-03-06 DIAGNOSIS — J9621 Acute and chronic respiratory failure with hypoxia: Secondary | ICD-10-CM

## 2023-03-06 DIAGNOSIS — I480 Paroxysmal atrial fibrillation: Secondary | ICD-10-CM | POA: Diagnosis not present

## 2023-03-06 DIAGNOSIS — I5022 Chronic systolic (congestive) heart failure: Secondary | ICD-10-CM | POA: Diagnosis not present

## 2023-03-06 DIAGNOSIS — I5023 Acute on chronic systolic (congestive) heart failure: Secondary | ICD-10-CM | POA: Diagnosis not present

## 2023-03-06 LAB — COMPREHENSIVE METABOLIC PANEL WITH GFR
ALT: 26 U/L (ref 0–44)
AST: 29 U/L (ref 15–41)
Albumin: 2.8 g/dL — ABNORMAL LOW (ref 3.5–5.0)
Alkaline Phosphatase: 274 U/L — ABNORMAL HIGH (ref 38–126)
Anion gap: 20 — ABNORMAL HIGH (ref 5–15)
BUN: 101 mg/dL — ABNORMAL HIGH (ref 6–20)
CO2: 20 mmol/L — ABNORMAL LOW (ref 22–32)
Calcium: 10.7 mg/dL — ABNORMAL HIGH (ref 8.9–10.3)
Chloride: 92 mmol/L — ABNORMAL LOW (ref 98–111)
Creatinine, Ser: 5.39 mg/dL — ABNORMAL HIGH (ref 0.61–1.24)
GFR, Estimated: 13 mL/min — ABNORMAL LOW (ref >60)
Glucose, Bld: 108 mg/dL — ABNORMAL HIGH (ref 70–99)
Potassium: 4.2 mmol/L (ref 3.5–5.1)
Sodium: 132 mmol/L — ABNORMAL LOW (ref 135–145)
Total Bilirubin: 0.7 mg/dL (ref 0.3–1.2)
Total Protein: 7.1 g/dL (ref 6.5–8.1)

## 2023-03-06 LAB — GLUCOSE, CAPILLARY
Glucose-Capillary: 101 mg/dL — ABNORMAL HIGH (ref 70–99)
Glucose-Capillary: 107 mg/dL — ABNORMAL HIGH (ref 70–99)
Glucose-Capillary: 125 mg/dL — ABNORMAL HIGH (ref 70–99)
Glucose-Capillary: 128 mg/dL — ABNORMAL HIGH (ref 70–99)
Glucose-Capillary: 60 mg/dL — ABNORMAL LOW (ref 70–99)
Glucose-Capillary: 80 mg/dL (ref 70–99)
Glucose-Capillary: 97 mg/dL (ref 70–99)

## 2023-03-06 LAB — CBC
HCT: 29.9 % — ABNORMAL LOW (ref 39.0–52.0)
Hemoglobin: 9.4 g/dL — ABNORMAL LOW (ref 13.0–17.0)
MCH: 33.6 pg (ref 26.0–34.0)
MCHC: 31.4 g/dL (ref 30.0–36.0)
MCV: 106.8 fL — ABNORMAL HIGH (ref 80.0–100.0)
Platelets: 364 10*3/uL (ref 150–400)
RBC: 2.8 MIL/uL — ABNORMAL LOW (ref 4.22–5.81)
RDW: 18.7 % — ABNORMAL HIGH (ref 11.5–15.5)
WBC: 6.4 10*3/uL (ref 4.0–10.5)
nRBC: 0 % (ref 0.0–0.2)

## 2023-03-06 LAB — COOXEMETRY PANEL
Carboxyhemoglobin: 2.6 % — ABNORMAL HIGH (ref 0.5–1.5)
Methemoglobin: 0.7 % (ref 0.0–1.5)
O2 Saturation: 66.9 %
Total hemoglobin: 10.5 g/dL — ABNORMAL LOW (ref 12.0–16.0)

## 2023-03-06 MED ORDER — MIDODRINE HCL 5 MG PO TABS: 10.0000 mg | ORAL_TABLET | Freq: Three times a day (TID) | ORAL | Status: DC

## 2023-03-06 MED ORDER — MELATONIN 5 MG PO TABS: 5.0000 mg | ORAL_TABLET | Freq: Every day | ORAL | Status: DC

## 2023-03-06 MED ORDER — GABAPENTIN 100 MG PO CAPS
100.0000 mg | ORAL_CAPSULE | Freq: Three times a day (TID) | ORAL | Status: DC | PRN
  Administered 2023-03-06 – 2023-03-11 (×5): 100 mg via ORAL
  Filled 2023-03-06 (×5): qty 1

## 2023-03-06 MED ORDER — DOCUSATE SODIUM 100 MG PO CAPS
100.0000 mg | ORAL_CAPSULE | Freq: Two times a day (BID) | ORAL | Status: DC | PRN
  Administered 2023-03-08: 100 mg via ORAL
  Filled 2023-03-06: qty 1

## 2023-03-06 MED ORDER — POLYETHYLENE GLYCOL 3350 17 G PO PACK: 17.0000 g | PACK | Freq: Every day | ORAL | Status: DC | PRN

## 2023-03-06 MED ORDER — LIDOCAINE 4 % EX CREA
TOPICAL_CREAM | Freq: Four times a day (QID) | CUTANEOUS | Status: DC | PRN
  Administered 2023-03-06: 1 via TOPICAL
  Filled 2023-03-06: qty 5

## 2023-03-06 MED ORDER — JUVEN PO PACK
1.0000 | PACK | Freq: Two times a day (BID) | ORAL | Status: DC
  Administered 2023-03-07 – 2023-03-08 (×4): 1 via ORAL
  Filled 2023-03-06 (×3): qty 1

## 2023-03-06 MED ORDER — MIDODRINE HCL 5 MG PO TABS
30.0000 mg | ORAL_TABLET | Freq: Three times a day (TID) | ORAL | Status: DC
  Administered 2023-03-07: 30 mg via ORAL
  Filled 2023-03-06: qty 6

## 2023-03-06 MED ORDER — CALAMINE EX LOTN
1.0000 | TOPICAL_LOTION | Freq: Four times a day (QID) | CUTANEOUS | Status: DC | PRN
  Administered 2023-03-07 – 2023-03-08 (×3): 1 via TOPICAL
  Filled 2023-03-06 (×2): qty 177

## 2023-03-06 MED ORDER — APIXABAN 5 MG PO TABS: 5.0000 mg | ORAL_TABLET | Freq: Two times a day (BID) | ORAL | Status: DC

## 2023-03-06 MED ORDER — FAMOTIDINE 20 MG PO TABS: 20.0000 mg | ORAL_TABLET | Freq: Every day | ORAL | Status: DC

## 2023-03-06 MED ORDER — SENNOSIDES-DOCUSATE SODIUM 8.6-50 MG PO TABS: 1.0000 | ORAL_TABLET | Freq: Every day | ORAL | Status: DC

## 2023-03-06 MED ORDER — ACETAMINOPHEN 325 MG PO TABS: 650.0000 mg | ORAL_TABLET | Freq: Four times a day (QID) | ORAL | Status: DC | PRN

## 2023-03-06 MED ORDER — MIDODRINE HCL 5 MG PO TABS
10.0000 mg | ORAL_TABLET | Freq: Once | ORAL | Status: AC
  Administered 2023-03-06: 10 mg via ORAL
  Filled 2023-03-06: qty 2

## 2023-03-06 MED ORDER — GUAIFENESIN-DM 100-10 MG/5ML PO SYRP: 5.0000 mL | ORAL_SOLUTION | ORAL | Status: DC | PRN

## 2023-03-06 MED ORDER — DIGOXIN 125 MCG PO TABS
0.1250 mg | ORAL_TABLET | ORAL | Status: DC
  Filled 2023-03-06: qty 1

## 2023-03-06 MED ORDER — DEXTROSE 50 % IV SOLN
INTRAVENOUS | Status: AC
  Administered 2023-03-06: 50 mL
  Filled 2023-03-06: qty 50

## 2023-03-06 MED ORDER — ACETAMINOPHEN 160 MG/5ML PO SOLN
650.0000 mg | Freq: Four times a day (QID) | ORAL | Status: DC | PRN
  Administered 2023-03-08: 650 mg via ORAL
  Filled 2023-03-06: qty 20.3

## 2023-03-06 MED ORDER — OXYCODONE HCL 5 MG PO TABS
5.0000 mg | ORAL_TABLET | ORAL | Status: DC | PRN
  Administered 2023-03-07 – 2023-03-18 (×30): 10 mg via ORAL
  Filled 2023-03-06 (×31): qty 2

## 2023-03-06 MED ORDER — DOCUSATE SODIUM 50 MG/5ML PO LIQD: 100.0000 mg | Freq: Two times a day (BID) | ORAL | Status: DC | PRN

## 2023-03-06 MED ORDER — SENNOSIDES-DOCUSATE SODIUM 8.6-50 MG PO TABS: 2.0000 | ORAL_TABLET | Freq: Every evening | ORAL | Status: DC | PRN

## 2023-03-06 MED ORDER — MIDODRINE HCL 5 MG PO TABS: 30.0000 mg | ORAL_TABLET | Freq: Three times a day (TID) | ORAL | Status: DC

## 2023-03-06 MED ORDER — RENA-VITE PO TABS
1.0000 | ORAL_TABLET | Freq: Every day | ORAL | Status: DC
  Administered 2023-03-07 – 2023-03-22 (×16): 1 via ORAL
  Filled 2023-03-06 (×16): qty 1

## 2023-03-06 NOTE — Progress Notes (Signed)
[name] NEPHROLOGY PROGRESS NOTE  Subjective:  sig problems with upstairs dialysis yesterday. Arterial cath limb was positional, circuit clogged early after starting. Rx resumed, reversed lines. Pt was restless and very uncomfortable in his bed which made the catheter which was positional work not so food. It did work for a while when the patient was sleeping. He only had 1.5 hrs of HD and 800 cc removed. TPA placed in both ports post HD  Spoke /w patient today, he says the main problem is the "sores" on his backside causing him discomfort. Right now they have him turned off the painful spots and he says that if he gets in the right position he will be okay.    Objective Vital signs in last 24 hours: Vitals:   02/14/23 1600 02/14/23 1615 02/14/23 1630 02/14/23 1645  BP:      Pulse:      Resp: (!) 43 (!) 38 (!) 35 (!) 31  Temp: 98.1 F (36.7 C)     TempSrc: Axillary     SpO2: 100%     Weight:      Height:         Physical Exam: chronically ill looking male, [place] O2 Heart:RRR, s1s2 nl Lungs:coarse breath sound b/l Abdomen: soft, +bs Extremities: 1+ bilat hip edema  Dialysis Access: RIJ TDC intact, LIJ temp HD catheter running crrt   OP HD: [facility] TTS  4h   180kg  425/1.5  TDC  Na citrate cath lock (no heparin) - last OP HD was 6/25, post wt 193.8kg - consistently left HD 8-12kg up at least the prior 3 wks - sensipar 60mg  three times per week - hectorol 4 mcg IV three times per week - mircera 200 mcg  IV q 2 wks, last 6/13, due 6/27    Assessment/ Plan: # Acute/ chronic hypotension - came off pressors 8/03, cont midodrine 30 tid.   # Pneumococcal bacteremia: admitting diagnosis w/ septic shock. Complex course. Last blood culture 7/23 negative.  Off abx now.   # Acute hypoxic and hypercapnic respiratory failure: extubated on 7/20.  Stable on lower dose of O2 at 3-5 lpm HFNC.   # Volume - mild LE edema, wt's down 178 . Only got 0.8 L off yesterday, see  above. Need to keep his volume under control, e.g. ideal wt prob around 173- 176kg   # ESRD - usual HD is TTS. Required emergent HD on 6/29 because of fluid overload.  Started CRRT on 6/29 due to shock.  CRRT dc'd 8/07 as shock had resolved. Back on iHD TTS. Next HD Tuesday.  Needs to be positioned in bed properly right before going to dialysis.   # Agitation - while on dialysis, related to decubitus ulcers and associated pain. Before dialysis make sure patient is positioned properly in the bed.   # HD access - 8/2 required temp HD catheter placement after [facility] malfunctioned. IR placed new [facility] 8/06.  # Prognosis - with uncontrolled afib, pressure sores, and other comorbidities prognosis is poor.  # Anemia of CKD:  on max ESA with variable Hbs, generally in the 9s.   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 Have resumed IV hectorol (half dose) since Ca++ levels are back to normal.   # Acute on chronic systolic CHF, nonischemic: EF 14-78%.  #A-fib with RVR with hypotension: S/P cardioversion 7/5 -> NSR -> afib RVR 7/6 early AM refractory to [facility] x3 + amiodarone. Persistent afib/flutter w/ HR > 100  Vinson Moselle  MD  [facility] 03/06/2023, 1:06 PM  Recent Labs  Lab 03/03/23 0422 03/04/23 0419 03/05/23 0505 03/06/23 0339  HGB  --   --  9.6* 9.4*  ALBUMIN  --  2.8* 2.8* 2.8*  CALCIUM 10.7* 10.7* 10.6* 10.7*  PHOS 3.5 3.6  --   --   CREATININE 3.52* 3.60* 5.25* 5.39*  K 4.1 3.9 4.1 4.2    Inpatient medications:  apixaban  5 mg Per Tube BID   Chlorhexidine Gluconate Cloth  6 each Topical Q0600   cholecalciferol  1,000 Units Oral Daily   darbepoetin (ARANESP) injection - DIALYSIS  200 mcg Subcutaneous Q Sat-1800   [START ON 03/07/2023] digoxin  0.125 mg Oral Once per day on Monday Wednesday Friday   doxercalciferol  2 mcg Intravenous Q T,Th,Sa-HD   famotidine  20 mg Per Tube Daily   feeding supplement (NEPRO CARB STEADY)  237 mL Per Tube TID BM   feeding supplement (PROSource TF20)  60 mL Per Tube  QID   feeding supplement (VITAL 1.5 CAL)  900 mL Per Tube Q24H   insulin aspart  0-9 Units Subcutaneous Q4H   lidocaine  1 patch Transdermal Q24H   melatonin  5 mg Per Tube QHS   midodrine  30 mg Per Tube TID WC   multivitamin  1 tablet Per Tube QHS   nutrition supplement (JUVEN)  1 packet Per Tube BID BM   mouth rinse  15 mL Mouth Rinse 4 times per day   senna-docusate  1 tablet Per Tube QHS    sodium chloride 10 mL/hr at 02/21/23 1444   anticoagulant sodium citrate     sodium chloride, acetaminophen **OR** acetaminophen (TYLENOL) oral liquid 160 mg/5 mL, alteplase, anticoagulant sodium citrate, bisacodyl, docusate sodium **OR** docusate, guaiFENesin-dextromethorphan, haloperidol lactate, hydrocortisone cream, hydrOXYzine, iohexol, ondansetron (ZOFRAN) IV, mouth rinse, oxyCODONE, polyethylene glycol, polyvinyl alcohol, senna-docusate, surgical lubricant

## 2023-03-06 NOTE — Progress Notes (Signed)
CHG bath performed.  Pt requested dry wash cloth rub to relieve itching, RN and NT obliged with extensive rubbing and turning.  Pt appreciative.  Requested and given several sips of thickened apple juice.  Pt comfortable with cell phone in reach.  Will cont plan of care

## 2023-03-06 NOTE — Progress Notes (Signed)
Patient's MEWS has been RED since the beginning of the shift. MD are aware and  came up on the floor, assess the patient and wrote news orders. The medications have been dispensed from Pharmacy and applied to the patient. We'll continue to monitor.

## 2023-03-06 NOTE — Progress Notes (Signed)
Patient refuses the feeding tube at this time, and the bath.

## 2023-03-06 NOTE — Progress Notes (Signed)
BiPAP set up @bedside  w/pt mask and tubing. Pt refused @this  time.   03/05/23 2315  BiPAP/CPAP/SIPAP  $ Non-Invasive Home Ventilator  Initial  BiPAP/CPAP/SIPAP Pt Type Adult  BiPAP/CPAP/SIPAP DREAMSTATIOND  Mask Type Full face mask (Home mask)  IPAP 21.5 cmH20  EPAP 11.5 cmH2O  Flow Rate 6 lpm  BiPAP/CPAP /SiPAP Vitals  SpO2 91 %

## 2023-03-06 NOTE — Significant Event (Signed)
Rapid Response Event Note   Reason for Call :  Desaturation, increased O2 needs  Initial Focused Assessment:  Patient A&O x4 though slightly drowsy and appears fatigued. Skin warm and dry. Lungs clear L>R. Heart tones irregular, fast. Edema present. Per report patient refused many interventions last night including O2/Cpap, telemetry, TF.   99/69 (78) HR 142 RR 26 O2 100% Bipap 40% Fi02  Interventions/Plan of Care:  Bipap Encourage bipap HS Delirium protocol  Event Summary:  MD Notified: B. Carlynn Purl MD Call Time: 0825 Arrival Time: 0829 End Time:   Truddie Crumble, RN

## 2023-03-06 NOTE — Progress Notes (Signed)
RT weaned patient off Bipap to [place] 6L with humidity. Patient more alert, states his breathing feels fine. RT will continue to monitor.

## 2023-03-06 NOTE — Progress Notes (Signed)
Patient tube feeding just ended and refuses to receive another bottle and said "not now". I will recheck with him later.

## 2023-03-06 NOTE — Progress Notes (Signed)
RT came to assess patient. Monitor was off. Patient placed on pulse oximetry, stats 56% with his nasal cannula off. RT placed patient back on [place] 6L, sats improved to 76%. Patient slower than normal to respond.  RT called for RN and Rapid response to come to the bedside. RT obtained and started patient on Bipap 22/11 at 40%. Sats improved to 100%. RT will continue to monitor.

## 2023-03-06 NOTE — Consult Note (Signed)
Cardiology Consultation   Patient ID: Jadon Mitter MRN: [id number]; DOB: [birth year]  Admit date: 01/21/2023 Date of Consult: 03/06/2023  PCP:  Marrianne Mood, MD   [facility] Providers Cardiologist:  Marca Ancona, MD  Electrophysiologist:  Will Jorja Loa, MD       Patient Profile:   Dmontae Gawlik is a 37 y.o. 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 Debe Coder.  History of Present Illness:   Mr. Astacio was admitted on 01/21/2023 has had a long hospital course which includes septic shock, A-fib RVR, ESRD with HD had been followed by multiple specialists.  In terms of his cardiac history while admitted he has had atrial fibrillation with rapid ventricular rate which has been very difficult to control and the patient has had intolerance to multiple medications.  Which included amiodarone with pulmonary toxicity, Tikosyn adoption due to ESRD, did not tolerate beta-blocker due to significant hypotension.  With Cardizem being avoided due to CHF as well as dronedarone. We are limited on therapies for the patient.   He was followed by our advanced heart failure team who have effortlessly try to optimize GDMT as well as treat his atrial fibrillation with both rhythm control but this was unsuccessful.  Unfortunately giving his comorbidities palliative care was suggested.  He is still requiring inpatient admission due to his hypoxic/hypercapnic respiratory failure, ischemic cardiomyopathy, A-fib RVR.  Thankfully his mixed septic/cardiogenic shock has resolved.  He is now ESRD on hemodialysis.  At this time cardiology has been reconsulted for his A-fib RVR  Past Medical History:   Diagnosis Date   Acute on chronic respiratory failure with hypoxia ([name]) 04/21/2021   Acute on chronic systolic (congestive) heart failure ([name]) 02/26/2020   Amiodarone toxicity    Anemia    Atrial flutter ([name])    Biventricular congestive heart failure ([name])    Chronic hypoxemic respiratory failure ([name])    Class 3 severe obesity due to excess calories with serious comorbidity and body mass index (BMI) of 50.0 to 59.9 in adult [facility]) 02/26/2020   Essential hypertension 02/26/2020   GERD without esophagitis 02/26/2020   Hidradenitis suppurativa 02/26/2020   NICM (nonischemic cardiomyopathy) ([name])    Obesity hypoventilation syndrome ([name])    OSA (obstructive sleep apnea)    PAF (paroxysmal atrial fibrillation) ([name])    Pneumonia    Prediabetes 02/26/2020    Past Surgical History:  Procedure Laterality Date   ABSCESS DRAINAGE     AV FISTULA PLACEMENT Left 08/21/2021   Procedure: LEFT ARM ARTERIOVENOUS (AV) FISTULA.;  Surgeon: Nada Libman, MD;  Location: [facility] OR;  Service: Vascular;  Laterality: Left;   CARDIAC CATHETERIZATION     CARDIOVERSION N/A 10/09/2021   Procedure: CARDIOVERSION;  Surgeon: Laurey Morale, MD;  Location: [facility] ENDOSCOPY;  Service: Cardiovascular;  Laterality: N/A;   CARDIOVERSION N/A 05/28/2022   Procedure: CARDIOVERSION;  Surgeon: Laurey Morale, MD;  Location: [facility] ENDOSCOPY;  Service: Cardiovascular;  Laterality: N/A;   CARDIOVERSION N/A 06/07/2022   Procedure: CARDIOVERSION;  Surgeon: Laurey Morale, MD;  Location: [facility] ENDOSCOPY;  Service: Cardiovascular;  Laterality: N/A;   IR FLUORO GUIDE CV LINE RIGHT  03/10/2021   IR FLUORO GUIDE CV LINE RIGHT  04/22/2021  IR FLUORO GUIDE CV LINE RIGHT  08/20/2021   IR FLUORO GUIDE CV LINE RIGHT  03/01/2023   IR US GUIDE VASC ACCESS RIGHT  03/10/2021   IR US GUIDE VASC ACCESS RIGHT  04/22/2021   RIGHT HEART CATH N/A 03/06/2021   Procedure: RIGHT HEART CATH;  Surgeon: Dolores Patty, MD;  Location: [facility] INVASIVE  CV LAB;  Service: Cardiovascular;  Laterality: N/A;   RIGHT/LEFT HEART CATH AND CORONARY ANGIOGRAPHY N/A 03/04/2020   Procedure: RIGHT/LEFT HEART CATH AND CORONARY ANGIOGRAPHY;  Surgeon: Laurey Morale, MD;  Location: [facility] INVASIVE CV LAB;  Service: Cardiovascular;  Laterality: N/A;   TEE WITHOUT CARDIOVERSION N/A 05/05/2021   Procedure: TRANSESOPHAGEAL ECHOCARDIOGRAM (TEE);  Surgeon: Laurey Morale, MD;  Location: [facility] ENDOSCOPY;  Service: Cardiovascular;  Laterality: N/A;   TEMPORARY DIALYSIS CATHETER  03/06/2021   Procedure: TEMPORARY DIALYSIS CATHETER;  Surgeon: Dolores Patty, MD;  Location: [facility] INVASIVE CV LAB;  Service: Cardiovascular;;       Inpatient Medications: Scheduled Meds:  apixaban  5 mg Per Tube BID   Chlorhexidine Gluconate Cloth  6 each Topical Q0600   cholecalciferol  1,000 Units Oral Daily   darbepoetin (ARANESP) injection - DIALYSIS  200 mcg Subcutaneous Q Sat-1800   doxercalciferol  2 mcg Intravenous Q T,Th,Sa-HD   famotidine  20 mg Per Tube Daily   feeding supplement (NEPRO CARB STEADY)  237 mL Per Tube TID BM   feeding supplement (PROSource TF20)  60 mL Per Tube QID   feeding supplement (VITAL 1.5 CAL)  900 mL Per Tube Q24H   insulin aspart  0-9 Units Subcutaneous Q4H   lidocaine  1 patch Transdermal Q24H   melatonin  5 mg Per Tube QHS   midodrine  30 mg Per Tube TID WC   multivitamin  1 tablet Per Tube QHS   nutrition supplement (JUVEN)  1 packet Per Tube BID BM   mouth rinse  15 mL Mouth Rinse 4 times per day   senna-docusate  1 tablet Per Tube QHS   Continuous Infusions:  sodium chloride 10 mL/hr at 02/21/23 1444   anticoagulant sodium citrate     PRN Meds: sodium chloride, acetaminophen **OR** acetaminophen (TYLENOL) oral liquid 160 mg/5 mL, alteplase, anticoagulant sodium citrate, bisacodyl, docusate sodium **OR** docusate, guaiFENesin-dextromethorphan, haloperidol lactate, hydrocortisone cream, hydrOXYzine, iohexol, ondansetron (ZOFRAN) IV, mouth  rinse, oxyCODONE, polyethylene glycol, polyvinyl alcohol, senna-docusate, surgical lubricant  Allergies:    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, SRA positive   Metoprolol Other (See Comments)    near syncope   Amoxicillin Other (See Comments)    Was hospitalized    Other Swelling and Other (See Comments)    Steroids Fluid seeping out of legs     Social History:   Social History   Socioeconomic History   Marital status: Single    Spouse name: Not on file   Number of children: Not on file   Years of education: Not on file   Highest education level: Not on file  Occupational History   Not on file  Tobacco Use   Smoking status: Former    Current packs/day: 0.00    Types: Cigarettes    Quit date: 2019    Years since quitting: 5.6    Passive  exposure: Current   Smokeless tobacco: Former   Tobacco comments:    quit in 2019  Vaping Use   Vaping status: Never Used  Substance and Sexual Activity   Alcohol use: Never   Drug use: Never   Sexual activity: Not on file  Other Topics Concern   Not on file  Social History Narrative   Not on file   Social Determinants of Health   Financial Resource Strain: High Risk (09/21/2022)   Overall Financial Resource Strain (CARDIA)    Difficulty of Paying Living Expenses: Hard  Food Insecurity: No Food Insecurity (01/22/2023)   Hunger Vital Sign    Worried About Running Out of Food in the Last Year: Never true    Ran Out of Food in the Last Year: Never true  Transportation Needs: No Transportation Needs (01/22/2023)   PRAPARE - Administrator, Civil Service (Medical): No    Lack of Transportation (Non-Medical): No  Physical Activity: Inactive (09/21/2022)   Exercise Vital Sign    Days of Exercise per Week: 0 days     Minutes of Exercise per Session: 0 min  Stress: No Stress Concern Present (09/21/2022)   [employer] of Occupational Health - Occupational Stress Questionnaire    Feeling of Stress : Not at all  Social Connections: Unknown (09/21/2022)   Social Connection and Isolation Panel [NHANES]    Frequency of Communication with Friends and Family: More than three times a week    Frequency of Social Gatherings with Friends and Family: Never    Attends Religious Services: Not on Marketing executive or Organizations: Not on file    Attends Banker Meetings: Never    Marital Status: Never married  Intimate Partner Violence: Not At Risk (01/22/2023)   Humiliation, Afraid, Rape, and Kick questionnaire    Fear of Current or Ex-Partner: No    Emotionally Abused: No    Physically Abused: No    Sexually Abused: No    Family History:    Family History  Problem Relation Age of Onset   Heart disease Mother    Hypertension Mother    Pulmonary Hypertension Mother    Drug abuse Father        died due to Heroin overdose     ROS:  Please see the history of present illness.   All other ROS reviewed and negative.     Physical Exam/Data:   Vitals:   03/06/23 0847 03/06/23 0900 03/06/23 1000 03/06/23 1100  BP: (!) 97/56 106/67 101/79 102/72  Pulse: (!) 103 (!) 131 68 (!) 101  Resp: (!) 28 (!) 26 (!) 24   Temp:      TempSrc:      SpO2: 100% 100% 98% 100%  Weight:      Height:        Intake/Output Summary (Last 24 hours) at 03/06/2023 1155 Last data filed at 03/05/2023 1717 Gross per 24 hour  Intake --  Output 800 ml  Net -800 ml      03/05/2023    5:53 PM 03/05/2023    1:46 PM 03/05/2023    4:11 AM  Last 3 Weights  Weight (lbs) 391 lb 8.6 oz 393 lb 8.3 oz 392 lb 6.7 oz  Weight (kg) 177.6 kg 178.5 kg 178 kg     Body mass index is 53.1 kg/m.  General: Morbidly obese male in no acute distress on BiPAP HEENT: normal Neck: no JVD Vascular: No  carotid bruits;  Distal pulses 2+ bilaterally Cardiac:  normal S1, S2; RRR; no murmur  Lungs:  clear to auscultation bilaterally, no wheezing, rhonchi or rales  Abd: soft, nontender, no hepatomegaly  Ext: no edema Musculoskeletal:  No deformities, BUE and BLE strength normal and equal Skin: warm and dry  Neuro:  CNs 2-12 intact, no focal abnormalities noted Psych:  Normal affect   EKG:  The EKG was personally reviewed and demonstrates:   Telemetry:  Telemetry was personally reviewed and demonstrates: Not on telemetry.  Note he refused  Relevant CV Studies: Echo reviewed  Laboratory Data:  High Sensitivity Troponin:  No results for input(s): "TROPONINIHS" in the last 720 hours.   Chemistry Recent Labs  Lab 03/01/23 0304 03/01/23 0327 03/02/23 0515 03/02/23 1301 03/03/23 0422 03/04/23 0419 03/05/23 0505 03/06/23 0339  NA 134*   < > 134*   < > 133* 133* 131* 132*  K 3.8   < > 3.9   < > 4.1 3.9 4.1 4.2  CL 96*   < > 98  --  98 94* 93* 92*  CO2 23   < > 21*  --  23 22 21* 20*  GLUCOSE 63*   < > 88  --  94 97 103* 108*  BUN 44*   < > 43*  --  72* 75* 110* 101*  CREATININE 2.00*   < > 2.14*  --  3.52* 3.60* 5.25* 5.39*  CALCIUM 9.9   < > 10.0  --  10.7* 10.7* 10.6* 10.7*  MG 2.2  --  2.7*  --  2.6*  --   --   --   GFRNONAA 43*   < > 40*  --  22* 21* 14* 13*  ANIONGAP 15   < > 15  --  12 17* 17* 20*   < > = values in this interval not displayed.    Recent Labs  Lab 03/04/23 0419 03/05/23 0505 03/06/23 0339  PROT  --  7.2 7.1  ALBUMIN 2.8* 2.8* 2.8*  AST  --  30 29  ALT  --  26 26  ALKPHOS  --  252* 274*  BILITOT  --  0.8 0.7   Lipids No results for input(s): "CHOL", "TRIG", "HDL", "LABVLDL", "LDLCALC", "CHOLHDL" in the last 168 hours.  Hematology Recent Labs  Lab 03/01/23 0304 03/01/23 0327 03/02/23 1301 03/05/23 0505 03/06/23 0339  WBC 5.8  --   --  8.8 6.4  RBC 2.29*  --   --  2.96* 2.80*  HGB 7.6*   < > 12.6* 9.6* 9.4*  HCT 24.6*   < > 37.0* 31.1* 29.9*  MCV 107.4*  --    --  105.1* 106.8*  MCH 33.2  --   --  32.4 33.6  MCHC 30.9  --   --  30.9 31.4  RDW 17.5*  --   --  18.3* 18.7*  PLT 259  --   --  361 364   < > = values in this interval not displayed.   Thyroid No results for input(s): "TSH", "FREET4" in the last 168 hours.  BNPNo results for input(s): "BNP", "PROBNP" in the last 168 hours.  DDimer No results for input(s): "DDIMER" in the last 168 hours.   Radiology/Studies:  DG Abd Portable 1V  Result Date: 03/04/2023 CLINICAL DATA:  Check feeding catheter placement EXAM: PORTABLE ABDOMEN - 1 VIEW COMPARISON:  None Available. FINDINGS: Weighted feeding catheter is noted within the stomach. The catheter is coiled  upon itself in the distal stomach. IMPRESSION: Weighted feeding catheter in the distal stomach as described. Electronically Signed   By: Alcide Clever M.D.   On: 03/04/2023 21:57     Assessment and Plan:   Afib rvr -has had multiple DCCV x 3 which has failed, also felt atrial fibrillation ablation was treated with amiodarone drip this was stopped due to amiodarone toxicity, did not tolerate beta-blockers, in the setting of his heart failure with reduced ejection fraction/systolic heart failure and ESRD Limited options in terms of rhythm control.  They reviewed been multiple attempts to get him to be transferred to to [facility] but giving his multiple failures and limited option this was not successful.  I do however see that he was given 2 doses of digoxin and there are no report of intolerance here.  We can try digoxin at 125 mcg every other day (ideally 3 times a week on nondialysis days) given his ESRD and be diligent with getting his digoxin levels.  I am not optimistic that this is going to really improve his heart rate because he has been resistant to other medications.  But we will give it a try.  Also equally important is that his atrial fibrillation is also driven by his underlying chronic condition especially his respiratory  status that is not optimized.  With that said we will not really be able to have this patient at a fully controlled rate, therefore we will have to be accepting and  lenient with heart rates up to 120 bpm.  2. Chronic systolic heart failure - has been difficult to optimize: Directed medical therapy due to low blood pressure and intolerances of medications.  Not an ICD candidate given obesity and dialysis.  Previous discussion not a candidate for advanced therapies.   Risk Assessment/Risk Scores:        New York Heart Association (NYHA) Functional Class NYHA Class IV  CHA2DS2-VASc Score =     This indicates a  % annual risk of stroke. The patient's score is based upon:        For questions or updates, please contact [facility] Please consult [email or web address] for contact info under    Signed, Thomasene Ripple, DO  03/06/2023 11:55 AM

## 2023-03-06 NOTE — Progress Notes (Signed)
Subjective:   Summary: Phillip Young is a 37 y.o. year old male currently admitted on the IMTS HD#44 for Acute on chronic hypoxic respiratory failure, ESRD on HD.  Overnight Events: NAEON  Subjective: Saw patient at bedside this AM. Discussed patient's refusal to complete dialysis yesterday, patient stated he was too uncomfortable to tolerate the entire session. Counseled patient on importance of continuing dialysis or else he could continue to deteriorate. Patient denies any further shortness of breath, chest pain, abd pain.   Objective:  Vital signs in last 24 hours: Vitals:   03/06/23 0830 03/06/23 0834 03/06/23 0835 03/06/23 0847  BP: (!) 100/40 (!) 71/55 99/69 (!) 97/56  Pulse: (!) 130 (!) 132 (!) 142 (!) 103  Resp: (!) 28 (!) 22 (!) 22 (!) 28  Temp:      TempSrc:      SpO2: 95% 100% 100% 100%  Weight:      Height:       Supplemental O2: HFNC SpO2: 100 % O2 Flow Rate (L/min): 4 L/min FiO2 (%): 40 %   Physical Exam:  Constitutional: chronically ill appearing, laying in bed, in no acute distress. Tremulous Cardiovascular: RRR, no murmurs, rubs or gallops Pulmonary/Chest: normal work of breathing on room air, lungs clear to auscultation bilaterally Abdominal: soft, non-tender, non-distended Skin: warm and dry Extremities: upper/lower extremity pulses 2+, no lower extremity edema present  Filed Weights   03/05/23 0411 03/05/23 1346 03/05/23 1753  Weight: (!) 178 kg (!) 178.5 kg (!) 177.6 kg     Intake/Output Summary (Last 24 hours) at 03/06/2023 0905 Last data filed at 03/05/2023 1717 Gross per 24 hour  Intake --  Output 800 ml  Net -800 ml   Net IO Since Admission: 6,964.36 mL [03/06/23 0905]  Pertinent Labs:    Latest Ref Rng & Units 03/06/2023    3:39 AM 03/05/2023    5:05 AM 03/02/2023    1:01 PM  CBC  WBC 4.0 - 10.5 K/uL 6.4  8.8    Hemoglobin 13.0 - 17.0 g/dL 9.4  9.6  16.1   Hematocrit 39.0 - 52.0 % 29.9  31.1  37.0   Platelets 150  - 400 K/uL 364  361         Latest Ref Rng & Units 03/06/2023    3:39 AM 03/05/2023    5:05 AM 03/04/2023    4:19 AM  CMP  Glucose 70 - 99 mg/dL 096  045  97   BUN 6 - 20 mg/dL 409  811  75   Creatinine 0.61 - 1.24 mg/dL 9.14  7.82  9.56   Sodium 135 - 145 mmol/L 132  131  133   Potassium 3.5 - 5.1 mmol/L 4.2  4.1  3.9   Chloride 98 - 111 mmol/L 92  93  94   CO2 22 - 32 mmol/L 20  21  22    Calcium 8.9 - 10.3 mg/dL 21.3  08.6  57.8   Total Protein 6.5 - 8.1 g/dL 7.1  7.2    Total Bilirubin 0.3 - 1.2 mg/dL 0.7  0.8    Alkaline Phos 38 - 126 U/L 274  252    AST 15 - 41 U/L 29  30    ALT 0 - 44 U/L 26  26      Imaging: No results found.   Assessment/Plan:   Principal Problem:   Acute hypoxic respiratory  failure [facility]) Active Problems:   Morbid obesity ([name])   Chronic systolic CHF (congestive heart failure) ([name])   Cardiogenic shock ([name])   ESRD (end stage renal disease) ([name])   ESRD (end stage renal disease) on dialysis ([name])   Hypervolemia   Septic shock ([name])   Atrial fibrillation with RVR ([name])   Pressure injury of skin   Patient Summary: Phillip Young is a 37 y.o. with a pertinent PMH of chronic hypoxic respiratory failure, ESRD, HFrEF with EF 20-25%, refractory Afib, amiodarone induced lung toxicity, who presented with fevers, joint pains, SOB and admitted for sepsis, now resolved.   #Acute on chronic hypoxemic/hypercapnic respiratory failure #Acute metabolic encephalopathy in setting of hypercapnia Patient was transferred from ICU on 8/9 after extubation and pressors were discontinued. Mental status has improved, patient is currently alert and following commands.  -Currently on 4L HFNC with O2 sats in mid 90s -Noncompliant with CPAP at home, nurses note patient has been refusing BiPAP at night -Encourage incentive spirometry and PT/OT  #Acute on chronic HFrEF with pulmonary edema #Paroxysmal Afib/flutter with RVR #Nonischemic cardiomyopathy EF less than  20% -Remains in Afib with RVR, HR ranging up to 150s -Patient intolerant of metoprolol and carvedilol due to allergy, amiodarone due to lung toxicity, diltiazem due to extremely reduced EF. Cardiology reconsulted for further recs -Continue Eliquis for stroke prophylaxis  #ESRD on HD T/Th/S #Recurrent metabolic acidosis, improving -Nephrology following, appreciate assistance -HD scheduled T/Th/S. Patient refused most recent session because of uncomfortable sitting position -Midodrine 30 TID for hypotension  #Mixed Septic and Cardiogenic Shock -Resolved, transferred to floor from ICU on 8/9  #Hypervolemic hyponatremia #Hyperphosphatemia #Hyperkalemia #Hypocalcemia -Monitor electrolytes with daily labs, supplement as necessary  #Anemia of chronic disease Monitor with daily labs, transfuse as needed  #Skin breakdown Perineal and sacral skin breakdown. Patient refuses rectal tube -Continue local wound care  Diet: Normal IVF: None,   VTE: DOAC Code: Full PT/OT recs:   Dispo: Anticipated discharge to Skilled nursing facility pending medical stability Monna Fam, MD PGY-1 Internal Medicine Resident Pager Number [phone] Please contact the on call pager after 5 pm and on weekends at [phone].

## 2023-03-07 DIAGNOSIS — L989 Disorder of the skin and subcutaneous tissue, unspecified: Secondary | ICD-10-CM

## 2023-03-07 DIAGNOSIS — J9622 Acute and chronic respiratory failure with hypercapnia: Secondary | ICD-10-CM | POA: Diagnosis not present

## 2023-03-07 DIAGNOSIS — D638 Anemia in other chronic diseases classified elsewhere: Secondary | ICD-10-CM

## 2023-03-07 DIAGNOSIS — I5022 Chronic systolic (congestive) heart failure: Secondary | ICD-10-CM | POA: Diagnosis not present

## 2023-03-07 DIAGNOSIS — J9621 Acute and chronic respiratory failure with hypoxia: Secondary | ICD-10-CM | POA: Diagnosis not present

## 2023-03-07 DIAGNOSIS — A419 Sepsis, unspecified organism: Secondary | ICD-10-CM | POA: Diagnosis not present

## 2023-03-07 DIAGNOSIS — E8770 Fluid overload, unspecified: Secondary | ICD-10-CM | POA: Diagnosis not present

## 2023-03-07 DIAGNOSIS — I480 Paroxysmal atrial fibrillation: Secondary | ICD-10-CM | POA: Diagnosis not present

## 2023-03-07 DIAGNOSIS — J9601 Acute respiratory failure with hypoxia: Secondary | ICD-10-CM | POA: Diagnosis not present

## 2023-03-07 DIAGNOSIS — I4891 Unspecified atrial fibrillation: Secondary | ICD-10-CM | POA: Diagnosis not present

## 2023-03-07 DIAGNOSIS — I5023 Acute on chronic systolic (congestive) heart failure: Secondary | ICD-10-CM | POA: Diagnosis not present

## 2023-03-07 LAB — GLUCOSE, CAPILLARY
Glucose-Capillary: 110 mg/dL — ABNORMAL HIGH (ref 70–99)
Glucose-Capillary: 103 mg/dL — ABNORMAL HIGH (ref 70–99)
Glucose-Capillary: 132 mg/dL — ABNORMAL HIGH (ref 70–99)
Glucose-Capillary: 105 mg/dL — ABNORMAL HIGH (ref 70–99)
Glucose-Capillary: 101 mg/dL — ABNORMAL HIGH (ref 70–99)

## 2023-03-07 MED ORDER — VITAMIN D 25 MCG (1000 UNIT) PO TABS
1000.0000 [IU] | ORAL_TABLET | Freq: Every day | ORAL | Status: DC
  Administered 2023-03-07 – 2023-03-11 (×5): 1000 [IU]
  Filled 2023-03-07 (×5): qty 1

## 2023-03-07 MED ORDER — APIXABAN 5 MG PO TABS
5.0000 mg | ORAL_TABLET | Freq: Two times a day (BID) | ORAL | Status: DC
  Administered 2023-03-08 – 2023-03-23 (×26): 5 mg via ORAL
  Filled 2023-03-07 (×33): qty 1

## 2023-03-07 MED ORDER — MIDODRINE HCL 5 MG PO TABS
30.0000 mg | ORAL_TABLET | Freq: Three times a day (TID) | ORAL | Status: DC
  Administered 2023-03-07 – 2023-03-23 (×43): 30 mg via ORAL
  Filled 2023-03-07 (×47): qty 6

## 2023-03-07 MED ORDER — SENNOSIDES-DOCUSATE SODIUM 8.6-50 MG PO TABS: 1.0000 | ORAL_TABLET | Freq: Every day | ORAL | Status: DC

## 2023-03-07 MED ORDER — SENNOSIDES-DOCUSATE SODIUM 8.6-50 MG PO TABS
1.0000 | ORAL_TABLET | Freq: Every day | ORAL | Status: DC
  Administered 2023-03-07 – 2023-03-19 (×8): 1 via ORAL
  Filled 2023-03-07 (×14): qty 1

## 2023-03-07 MED ORDER — FAMOTIDINE 20 MG PO TABS: 20.0000 mg | ORAL_TABLET | Freq: Every day | ORAL | Status: DC

## 2023-03-07 MED ORDER — DIGOXIN 125 MCG PO TABS
0.1250 mg | ORAL_TABLET | ORAL | Status: DC
  Administered 2023-03-07: 0.125 mg

## 2023-03-07 MED ORDER — MELATONIN 5 MG PO TABS: 5.0000 mg | ORAL_TABLET | Freq: Every day | ORAL | Status: DC

## 2023-03-07 MED ORDER — APIXABAN 5 MG PO TABS: 5.0000 mg | ORAL_TABLET | Freq: Two times a day (BID) | ORAL | Status: DC

## 2023-03-07 MED ORDER — FAMOTIDINE 20 MG PO TABS
20.0000 mg | ORAL_TABLET | Freq: Every day | ORAL | Status: DC
  Administered 2023-03-07 – 2023-03-14 (×7): 20 mg via ORAL
  Filled 2023-03-07 (×8): qty 1

## 2023-03-07 MED ORDER — MELATONIN 5 MG PO TABS
5.0000 mg | ORAL_TABLET | Freq: Every day | ORAL | Status: DC
  Administered 2023-03-07 – 2023-03-22 (×16): 5 mg via ORAL
  Filled 2023-03-07 (×16): qty 1

## 2023-03-07 MED ORDER — MIDODRINE HCL 5 MG PO TABS: 30.0000 mg | ORAL_TABLET | Freq: Three times a day (TID) | ORAL | Status: DC

## 2023-03-07 MED ORDER — DIGOXIN 125 MCG PO TABS: 0.1250 mg | ORAL_TABLET | ORAL | Status: DC

## 2023-03-07 NOTE — Progress Notes (Signed)
Occupational Therapy Treatment Patient Details Name: Phillip Young MRN: [id number] DOB: 02-24-1986 Today's Date: 03/07/2023   History of present illness Pt is a 37 y/o M presenting to ED on 6/28 with fever, joint pain, and SOB after missing HD. Admitted for acute hypoxic respiratory failure. Intubated and started on CRRT, vasopressor support on 6/30. Extubated 7/20. Underwent DCCV 7/5, 7/6, and 7/7 without success. Pt off CRRT on 8/7.  PMH includes ESRD, NICM, refractory A fib, HFrEF   OT comments  Patient with fair progress toward patient focused goals.  Patient able to progress to sitting EOB with +2.  Plan of care reviewed and upper body ADL goals established.  OT to continue efforts in the acute setting to address deficits, and Patient will benefit from continued inpatient follow up therapy, <3 hours/day       If plan is discharge home, recommend the following:  Two people to help with walking and/or transfers;Two people to help with bathing/dressing/bathroom;Assistance with cooking/housework;Assistance with feeding;Direct supervision/assist for medications management;Direct supervision/assist for financial management;Help with stairs or ramp for entrance;Assist for transportation   Equipment Recommendations  Hospital bed;Wheelchair cushion (measurements OT);Wheelchair (measurements OT)    Recommendations for Other Services      Precautions / Restrictions Precautions Precautions: Fall Precaution Comments: wounds on hip/back/buttocks Restrictions Weight Bearing Restrictions: No       Mobility Bed Mobility   Bed Mobility: Sidelying to Sit, Sit to Sidelying   Sidelying to sit: Max assist, +2 for physical assistance     Sit to sidelying: +2 for physical assistance, Max assist      Transfers                         Balance Overall balance assessment: Needs assistance Sitting-balance support: Feet unsupported, Bilateral upper extremity supported Sitting  balance-Leahy Scale: Fair                                     ADL either performed or assessed with clinical judgement   ADL       Grooming: Wash/dry face;Contact guard assist;Sitting                                      Extremity/Trunk Assessment Upper Extremity Assessment RUE Deficits / Details: unable to flex shoulder without assists, elbow/wrist/hand ROM 3+/5 RUE Coordination: decreased fine motor;decreased gross motor   Lower Extremity Assessment Lower Extremity Assessment: Defer to PT evaluation        Vision Patient Visual Report: No change from baseline     Perception Perception Perception: Within Functional Limits   Praxis Praxis Praxis: WFL    Cognition Arousal: Alert Behavior During Therapy: WFL for tasks assessed/performed Overall Cognitive Status: Within Functional Limits for tasks assessed Area of Impairment: Safety/judgement                         Safety/Judgement: Decreased awareness of deficits              Exercises      Shoulder Instructions       General Comments HR as high as 130's with activity and SpO2 WFL throughout.    Pertinent Vitals/ Pain       Pain Assessment Pain Assessment: Faces Faces Pain Scale: Hurts even more Pain  Location: buttocks, B feet/ankles Pain Descriptors / Indicators: Discomfort, Aching, Grimacing Pain Intervention(s): Monitored during session  Home Living                                          Prior Functioning/Environment              Frequency  Min 1X/week        Progress Toward Goals  OT Goals(current goals can now be found in the care plan section)  Progress towards OT goals: Progressing toward goals  Acute Rehab OT Goals OT Goal Formulation: With patient Time For Goal Achievement: 03/21/23 Potential to Achieve Goals: Fair ADL Goals Pt Will Perform Grooming: with set-up;sitting Pt Will Perform Upper Body Bathing: with  supervision;sitting Pt Will Perform Upper Body Dressing: sitting;with min assist  Plan      Co-evaluation    PT/OT/SLP Co-Evaluation/Treatment: Yes Reason for Co-Treatment: For patient/therapist safety;Complexity of the patient's impairments (multi-system involvement);To address functional/ADL transfers PT goals addressed during session: Mobility/safety with mobility;Strengthening/ROM OT goals addressed during session: ADL's and self-care;Strengthening/ROM      AM-PAC OT "6 Clicks" Daily Activity     Outcome Measure   Help from another person eating meals?: A Little Help from another person taking care of personal grooming?: A Little Help from another person toileting, which includes using toliet, bedpan, or urinal?: Total Help from another person bathing (including washing, rinsing, drying)?: A Lot Help from another person to put on and taking off regular upper body clothing?: A Lot Help from another person to put on and taking off regular lower body clothing?: Total 6 Click Score: 12    End of Session Equipment Utilized During Treatment: Oxygen  OT Visit Diagnosis: Unsteadiness on feet (R26.81);Other abnormalities of gait and mobility (R26.89);Muscle weakness (generalized) (M62.81);Other symptoms and signs involving cognitive function   Activity Tolerance Patient limited by pain   Patient Left in bed;with call bell/phone within reach;with family/visitor present   Nurse Communication Mobility status        Time: 1430-1500 OT Time Calculation (min): 30 min  Charges: OT General Charges $OT Visit: 1 Visit OT Treatments $Therapeutic Activity: 8-22 mins  03/07/2023  RP, OTR/L  Acute Rehabilitation Services  Office:  [phone]   Suzanna Obey 03/07/2023, 3:45 PM

## 2023-03-07 NOTE — Progress Notes (Addendum)
Patient Name: Phillip Young Date of Encounter: 03/07/2023 [facility] Cardiologist: Marca Ancona, MD   Interval Summary  .    37 y.o. 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   The A fib has been difficult to control and he is intolerant to multiple medications.  Which included amiodarone with pulmonary toxicity, Tikosyn adoption due to ESRD, did not tolerate beta-blocker due to significant hypotension.  With Cardizem being avoided due to CHF as well as dronedarone. We are limited on therapies for the patient. Continues with hypoxia/hypercapnic respiratory failure, ICM and a fib RVR.   Today HR is 130s to 170 higher with pain per pt.   Has foot pain and sacral pain   Vital Signs .    Vitals:   03/07/23 0130 03/07/23 0451 03/07/23 0715 03/07/23 0800  BP: 101/62 (!) 116/94 93/78 130/70  Pulse: (!) 111  (!) 128 (!) 122  Resp: 19  20   Temp:  98.8 F (37.1 C)    TempSrc:  Oral    SpO2: 97%  100% 100%  Weight:      Height:        Intake/Output Summary (Last 24 hours) at 03/07/2023 0928 Last data filed at 03/06/2023 2300 Gross per 24 hour  Intake 1227.5 ml  Output --  Net 1227.5 ml      03/05/2023    5:53 PM 03/05/2023    1:46 PM 03/05/2023    4:11 AM  Last 3 Weights  Weight (lbs) 391 lb 8.6 oz 393 lb 8.3 oz 392 lb 6.7 oz  Weight (kg) 177.6 kg 178.5 kg 178 kg      Telemetry/ECG    A fib/flutter RVR - Personally Reviewed  EKG  a flutter with RVR at 127 and T wave inversion inf laterally more pronounced laterally than on 2023-03-07 but similar to EKG 01/27/23 with similar rate.    Echo 01/25/23  --EF 20-25%, no RWMA, mild concentric LVH, LV internal cavity mildly dilated.  EF similar on past echoes dating back to  02/2020    Physical Exam .   GEN: No acute distress.   Neck: No JVD Cardiac: irreg irreg rapid, no murmurs, rubs, or gallops.  Respiratory: Clear to auscultation bilaterally. Ant.  GI: Soft, nontender, non-distended  MS: mild edema  Assessment & Plan .     Atrial fib RVR  --has had multiple DCCV X 3 in past which failed - last DCCV 01/27/23  3 shocks but failed to convert; 01/28/23; 01/30/23 to SR on the 5th only but on the 7th had 3 shocks with sternal pressure and remained in a fib.  Attempted a fib ablation 12/23 at [facility] was discharged in SR, but recurrent a fib a month later. Dronederone avoided due to CHF while taking,  (not a candidate for amiodarone with pulmonary toxicity and elevated LFTs and not a candidate for Tikosyn with ESRD) pt seen by AHF team in July and performed the DCCV and tried using amiodarone but all unsuccessful.   --dig 125 mcg planned for every other day ( 3 times per week on non dialysis days)  --lenient HR to 120  but HR 130s to 170s.   --pt asking to see Dr. Shirlee Latch or Dr. Gala Romney-- I discussed with  Dr. Shirlee Latch and he was not comfortable with the digoxin.  He felt try low dose BB.  He will come by to say hello when he is in the hospital.   Will defer to Dr. Duke Salvia. --palliative care did see in July but signed off - unable to talk with family.   Chronic systolic HF --Directed medical therapy due to low blood pressure and intolerances of medications. Not an ICD candidate given obesity and dialysis. Previous discussion not a candidate for advanced therapies  --per I&O +2,088 and wt 2 days ago 177.6 Kg down from 179.2 Kg on the 7th --coox  63 this AM      For questions or updates, please contact [facility] Please consult [email or web address] for contact info under        Signed, Nada Boozer, NP

## 2023-03-07 NOTE — Progress Notes (Signed)
Subjective:   Summary: Phillip Young is a 37 y.o. year old male currently admitted on the IMTS HD#45 for Acute on chronic hypoxic respiratory failure, ESRD on HD.  Overnight Events: Night team paged for hypotension, given midodrine 30mg . Tachycardic with Afib on EKG  Subjective: Saw patient at bedside this AM. [name] was limited by patient on BiPAP. Patient indicated he was feeling okay, had no severe shortness of breath or new pain. Updated on plan. Multiple excoriations on chest noted.   Objective:  Vital signs in last 24 hours: Vitals:   03/07/23 0115 03/07/23 0130 03/07/23 0451 03/07/23 0715  BP: (!) 115/55 101/62 (!) 116/94 93/78  Pulse: 67 (!) 111  (!) 128  Resp: (!) 27 19  20   Temp:   98.8 F (37.1 C)   TempSrc:   Oral   SpO2: 97% 97%  100%  Weight:      Height:       Supplemental O2: HFNC SpO2: 100 % O2 Flow Rate (L/min): 5 L/min FiO2 (%): 40 %   Physical Exam:  Constitutional: chronically ill appearing, laying in bed, in no acute distress. Tremulous Cardiovascular: RRR, no murmurs, rubs or gallops Pulmonary/Chest: normal work of breathing on room air, lungs clear to auscultation bilaterally Abdominal: soft, non-tender, non-distended Skin: warm and dry Extremities: upper/lower extremity pulses 2+, no lower extremity edema present  Filed Weights   03/05/23 0411 03/05/23 1346 03/05/23 1753  Weight: (!) 178 kg (!) 178.5 kg (!) 177.6 kg     Intake/Output Summary (Last 24 hours) at 03/07/2023 0840 Last data filed at 03/06/2023 2300 Gross per 24 hour  Intake 1227.5 ml  Output --  Net 1227.5 ml   Net IO Since Admission: 9,793.86 mL [03/07/23 0840]  Pertinent Labs:    Latest Ref Rng & Units 03/07/2023    6:21 AM 03/06/2023    3:39 AM 03/05/2023    5:05 AM  CBC  WBC 4.0 - 10.5 K/uL 8.3  6.4  8.8   Hemoglobin 13.0 - 17.0 g/dL 8.6  9.4  9.6   Hematocrit 39.0 - 52.0 % 27.1  29.9  31.1   Platelets 150 - 400 K/uL 302  364  361         Latest Ref Rng & Units 03/07/2023    6:21 AM 03/06/2023    3:39 AM 03/05/2023    5:05 AM  CMP  Glucose 70 - 99 mg/dL 130  865  784   BUN 6 - 20 mg/dL 696  295  284   Creatinine 0.61 - 1.24 mg/dL 1.32  4.40  1.02   Sodium 135 - 145 mmol/L 128  132  131   Potassium 3.5 - 5.1 mmol/L 4.8  4.2  4.1   Chloride 98 - 111 mmol/L 91  92  93   CO2 22 - 32 mmol/L 18  20  21    Calcium 8.9 - 10.3 mg/dL 72.5  36.6  44.0   Total Protein 6.5 - 8.1 g/dL  7.1  7.2   Total Bilirubin 0.3 - 1.2 mg/dL  0.7  0.8   Alkaline Phos 38 - 126 U/L  274  252   AST 15 - 41 U/L  29  30   ALT 0 - 44 U/L  26  26     Imaging: No results found.   Assessment/Plan:   Principal Problem:   Acute hypoxic respiratory failure ([name])  Active Problems:   Morbid obesity ([name])   Chronic systolic CHF (congestive heart failure) ([name])   Cardiogenic shock ([name])   ESRD (end stage renal disease) ([name])   ESRD (end stage renal disease) on dialysis ([name])   ESRD on dialysis ([name])   Hypervolemia   Sepsis with acute organ dysfunction without septic shock ([name])   Atrial fibrillation with RVR ([name])   Pressure injury of skin   Patient Summary: Phillip Young is a 37 y.o. with a pertinent PMH of chronic hypoxic respiratory failure, ESRD, HFrEF with EF 20-25%, refractory Afib, amiodarone induced lung toxicity, who presented with fevers, joint pains, SOB and admitted for sepsis, now resolved.   #Acute on chronic hypoxemic/hypercapnic respiratory failure #Acute metabolic encephalopathy in setting of hypercapnia Patient was transferred from ICU on 8/9 after extubation and pressors were discontinued. Mental status has improved, patient is currently alert and following commands.  -Currently on 5L HFNC with O2 sats in mid 90s -Noncompliant with CPAP at home, nurses note patient has been refusing BiPAP at night -Encourage incentive spirometry and PT/OT  #Acute on chronic HFrEF with pulmonary edema #Paroxysmal Afib/flutter with  RVR #Nonischemic cardiomyopathy EF less than 20% -Remains in Afib with RVR, HR ranging up to 150s -Patient intolerant of metoprolol and carvedilol due to allergy, amiodarone due to lung toxicity, diltiazem due to extremely reduced EF -Cardiology consulting, recommending digoxin every other day (non-dialysis days) and testing digoxin levels every dialysis morning. Appreciate recs -Continue Eliquis for stroke prophylaxis  #ESRD on HD T/Th/S #Recurrent metabolic acidosis, improving -Nephrology following, appreciate assistance -HD scheduled T/Th/S. Patient did not complete most recent session because of uncomfortable sitting position and skin breakdown on back -Midodrine 30 TID for hypotension  #Mixed Septic and Cardiogenic Shock -Resolved, transferred to floor from ICU on 8/9  #Hypervolemic hyponatremia #Hyperphosphatemia #Hyperkalemia #Hypocalcemia -Monitor electrolytes with daily labs, supplement as necessary  #Anemia of chronic disease Monitor with daily labs, transfuse as needed  #Skin breakdown Perineal and sacral skin breakdown. Patient refuses rectal tube -Continue local wound care -Consider reconsulting wound care as pain has made patient intolerant of completing dialysis sessions  Diet: Normal IVF: None,   VTE: DOAC Code: Full PT/OT recs:   Dispo: Anticipated discharge to Skilled nursing facility pending medical stability Monna Fam, MD PGY-1 Internal Medicine Resident Pager Number [phone] Please contact the on call pager after 5 pm and on weekends at [phone].

## 2023-03-07 NOTE — Progress Notes (Signed)
Physical Therapy Treatment Patient Details Name: Phillip Young MRN: [id number] DOB: 12-28-1985 Today's Date: 03/07/2023   History of Present Illness Pt is a 37 y/o M presenting to ED on 6/28 with fever, joint pain, and SOB after missing HD. Admitted for acute hypoxic respiratory failure. Intubated and started on CRRT, vasopressor support on 6/30. Extubated 7/20. Underwent DCCV 7/5, 7/6, and 7/7 without success. Pt off CRRT on 8/7.  PMH includes ESRD, NICM, refractory A fib, HFrEF    PT Comments  Pt received in supine and very motivated to participate in session. Pt able to tolerate increased BLE mobility and assist, however continues to report high sensitivity and burning in B feet. Pt able to  sit to EOB with max A +2 and demonstrate improved sitting tolerance. Pt able to perform BLE therex and maintain sitting balance with min guard. Pt able to tolerate 40 degree tilt for ~5 mins. Pt unable to tolerate chair position due to sacral pain. Pt reporting intermittent dizziness during mobility that resolved quickly. Pt continues to benefit from PT services to progress toward functional mobility goals.    If plan is discharge home, recommend the following: Two people to help with walking and/or transfers;Two people to help with bathing/dressing/bathroom;Direct supervision/assist for medications management;Direct supervision/assist for financial management;Assist for transportation;Help with stairs or ramp for entrance   Can travel by private vehicle     No  Equipment Recommendations   (TBD at next venue)    Recommendations for Other Services       Precautions / Restrictions Precautions Precautions: Fall Precaution Comments: NGT, multiple lines, wounds on hip/back/buttocks Restrictions Weight Bearing Restrictions: No     Mobility  Bed Mobility Overal bed mobility: Needs Assistance Bed Mobility: Supine to Sit     Supine to sit: Max assist, +2 for physical assistance     General bed  mobility comments: Pt able to advance BLE towards EOB and hold bedrail, however requires max A +2 for trunk elevation and scooting towards EOB with bedpad. Start Time: 1445 Angle: 40 degrees Total Minutes in Angle: 5 minutes Patient Response: Cooperative   Tilt Bed Tilt Bed Start Time: 1445 Angle: 40 degrees Total Minutes in Angle: 5 minutes Patient Response: Cooperative       Balance Overall balance assessment: Needs assistance Sitting-balance support: Feet supported Sitting balance-Leahy Scale: Poor Sitting balance - Comments: Sitting EOB with initial assist, but progressing to min guard and pt able to perform BLE therex without LOB                                    Cognition Arousal: Alert Behavior During Therapy: WFL for tasks assessed/performed Overall Cognitive Status: Within Functional Limits for tasks assessed                                          Exercises General Exercises - Lower Extremity Long Arc Quad: AROM, Both, 5 reps, Seated Heel Slides: AAROM, Supine, Both, 5 reps    General Comments General comments (skin integrity, edema, etc.): HR as high as 130's with activity and SpO2 WFL throughout.      Pertinent Vitals/Pain Pain Assessment Pain Assessment: Faces Faces Pain Scale: Hurts whole lot Pain Location: buttocks, B feet/ankles Pain Descriptors / Indicators: Discomfort, Aching, Grimacing Pain Intervention(s): Limited activity within patient's tolerance, Monitored  during session, Repositioned     PT Goals (current goals can now be found in the care plan section) Acute Rehab PT Goals Patient Stated Goal: Not stated PT Goal Formulation: With patient Time For Goal Achievement: 03/07/23 Potential to Achieve Goals: Fair Progress towards PT goals: Progressing toward goals    Frequency    Min 1X/week      PT Plan      Co-evaluation PT/OT/SLP Co-Evaluation/Treatment: Yes Reason for Co-Treatment: For  patient/therapist safety;Complexity of the patient's impairments (multi-system involvement);To address functional/ADL transfers PT goals addressed during session: Mobility/safety with mobility;Strengthening/ROM        AM-PAC PT "6 Clicks" Mobility   Outcome Measure  Help needed turning from your back to your side while in a flat bed without using bedrails?: A Lot Help needed moving from lying on your back to sitting on the side of a flat bed without using bedrails?: Total Help needed moving to and from a bed to a chair (including a wheelchair)?: Total Help needed standing up from a chair using your arms (e.g., wheelchair or bedside chair)?: Total Help needed to walk in hospital room?: Total Help needed climbing 3-5 steps with a railing? : Total 6 Click Score: 7    End of Session Equipment Utilized During Treatment: Oxygen Activity Tolerance: Patient tolerated treatment well Patient left: in bed;with call bell/phone within reach;with family/visitor present Nurse Communication: Mobility status PT Visit Diagnosis: Muscle weakness (generalized) (M62.81);Other abnormalities of gait and mobility (R26.89)     Time: 9518-8416 PT Time Calculation (min) (ACUTE ONLY): 48 min  Charges:    $Therapeutic Exercise: 8-22 mins $Therapeutic Activity: 8-22 mins PT General Charges $$ ACUTE PT VISIT: 1 Visit                     Johny Shock, PTA Acute Rehabilitation Services Secure Chat Preferred  [phone]    Johny Shock 03/07/2023, 3:25 PM

## 2023-03-07 NOTE — Progress Notes (Signed)
SLP Cancellation Note  Patient Details Name: Mubeen Afflerbach MRN: [id number] DOB: 20-Apr-1986   Cancelled treatment:       Reason Eval/Treat Not Completed: Other (comment) (patient working with PT and OT. SLP will continue to follow)  Angela Nevin, MA, CCC-SLP Speech Therapy

## 2023-03-07 NOTE — Consult Note (Signed)
ELECTROPHYSIOLOGY CONSULT NOTE    Patient ID: Clever Shor MRN: [id number], DOB/AGE: 09-04-1985 37 y.o.  Admit date: 01/21/2023 Date of Consult: 03/07/2023  Primary Physician: Marrianne Mood, MD Primary Cardiologist: Marca Ancona, MD  Electrophysiologist: Dr. Elberta Fortis  Referring Provider: Dr. Duke Salvia  Patient Profile: Phillip Young is a 37 y.o. male with a history of NICM, biventricular HF, PAF/AFL s/p multiple DCCVs, multiple intolerance to amiodarone, ESRD on iHD, OSA on CPAP, and morbid obesity who is being seen today for the evaluation of poorly controlled AFL/AF at the request of Dr. Duke Salvia  HPI:  Phillip Young is a 36 y.o. male with medical history as above.   Known to EP team from prior issues with poorly controlled atrial arrhythmias. Had AFL and was tentatively planned for ablation 05/2022, though developed Afib and with potentially multiple flutter circuits was felt to not be candidate.   Ultimately, appears he underwent AF ablation 06/2022 at [facility].  Amiodarone previously stopped with pulmonary toxicity and elevated LFTs requiring steroids.   Pt has been admitted since 6/28 for septic shock (Strep pneumoniae) super imposed on HCAP with dyspnea and fever to 102. WBC to 35.9K.  Ultimately require intubation and CVVH.  HF team engaged and followed along.    [facility] refused transfer 7/7 with no options for advanced procedures.  Palliative saw at one point, family deferred and wished for continued full code. HF team signed off 7/10 with no options.  Extubated 7/20. Pressors gradually weaned.   Cardiology re-engaged 8/11 for on-going difficulty controlling his AF with RVR.  He received po dose of digoxin today with plan to give M/W/F only. EP also asked to see for any recommendations.   Pt lying in bed, SOB with conversation on Nasal Cannula. Verbalizes understanding that he is not a candidate for advanced procedures or surgery at this juncture, and is unlikely to be.     Labs Potassium4.8 (08/12 4098)   Creatinine, ser  6.82* (08/12 0621) PLT  302 (08/12 0621) HGB  8.6* (08/12 0621) WBC 8.3 (08/12 0621)  .    Past Medical History:  Diagnosis Date   Acute on chronic respiratory failure with hypoxia ([name]) 04/21/2021   Acute on chronic systolic (congestive) heart failure ([name]) 02/26/2020   Amiodarone toxicity    Anemia    Atrial flutter ([name])    Biventricular congestive heart failure ([name])    Chronic hypoxemic respiratory failure ([name])    Class 3 severe obesity due to excess calories with serious comorbidity and body mass index (BMI) of 50.0 to 59.9 in adult [facility]) 02/26/2020   Essential hypertension 02/26/2020   GERD without esophagitis 02/26/2020   Hidradenitis suppurativa 02/26/2020   NICM (nonischemic cardiomyopathy) ([name])    Obesity hypoventilation syndrome ([name])    OSA (obstructive sleep apnea)    PAF (paroxysmal atrial fibrillation) ([name])    Pneumonia    Prediabetes 02/26/2020     Surgical History:  Past Surgical History:  Procedure Laterality Date   ABSCESS DRAINAGE     AV FISTULA PLACEMENT Left 08/21/2021   Procedure: LEFT ARM ARTERIOVENOUS (AV) FISTULA.;  Surgeon: Nada Libman, MD;  Location: [facility] OR;  Service: Vascular;  Laterality: Left;   CARDIAC CATHETERIZATION     CARDIOVERSION N/A 10/09/2021   Procedure: CARDIOVERSION;  Surgeon: Laurey Morale, MD;  Location: [facility] ENDOSCOPY;  Service: Cardiovascular;  Laterality: N/A;   CARDIOVERSION N/A 05/28/2022   Procedure: CARDIOVERSION;  Surgeon: Laurey Morale, MD;  Location: [facility] ENDOSCOPY;  Service: Cardiovascular;  Laterality:  N/A;   CARDIOVERSION N/A 06/07/2022   Procedure: CARDIOVERSION;  Surgeon: Laurey Morale, MD;  Location: [facility] ENDOSCOPY;  Service: Cardiovascular;  Laterality: N/A;   IR FLUORO GUIDE CV LINE RIGHT  03/10/2021   IR FLUORO GUIDE CV LINE RIGHT  04/22/2021   IR FLUORO GUIDE CV LINE RIGHT  08/20/2021   IR FLUORO GUIDE CV LINE RIGHT  03/01/2023   IR US GUIDE  VASC ACCESS RIGHT  03/10/2021   IR US GUIDE VASC ACCESS RIGHT  04/22/2021   RIGHT HEART CATH N/A 03/06/2021   Procedure: RIGHT HEART CATH;  Surgeon: Dolores Patty, MD;  Location: [facility] INVASIVE CV LAB;  Service: Cardiovascular;  Laterality: N/A;   RIGHT/LEFT HEART CATH AND CORONARY ANGIOGRAPHY N/A 03/04/2020   Procedure: RIGHT/LEFT HEART CATH AND CORONARY ANGIOGRAPHY;  Surgeon: Laurey Morale, MD;  Location: [facility] INVASIVE CV LAB;  Service: Cardiovascular;  Laterality: N/A;   TEE WITHOUT CARDIOVERSION N/A 05/05/2021   Procedure: TRANSESOPHAGEAL ECHOCARDIOGRAM (TEE);  Surgeon: Laurey Morale, MD;  Location: [facility] ENDOSCOPY;  Service: Cardiovascular;  Laterality: N/A;   TEMPORARY DIALYSIS CATHETER  03/06/2021   Procedure: TEMPORARY DIALYSIS CATHETER;  Surgeon: Dolores Patty, MD;  Location: [facility] INVASIVE CV LAB;  Service: Cardiovascular;;     Medications Prior to Admission  Medication Sig Dispense Refill Last Dose   apixaban (ELIQUIS) 5 MG TABS tablet Take 1 tablet (5 mg total) by mouth 2 (two) times daily. 60 tablet 5 01/19/2023 at [facility]   calamine lotion Apply 1 Application topically 2 (two) times daily. 177 mL 0    camphor-menthol (SARNA) lotion Apply topically as needed for itching. 222 mL 0    clindamycin (CLINDAGEL) 1 % gel Apply topically 2 (two) times daily. Apply to hydradenitis wounds 30 g 0    fluticasone furoate-vilanterol (BREO ELLIPTA) 100-25 MCG/ACT AEPB INHALE 1 PUFF INTO THE LUNGS DAILY 180 each 3    gabapentin (NEURONTIN) 100 MG capsule Take by mouth.      hydrOXYzine (ATARAX) 25 MG tablet Take 25 mg by mouth 2 (two) times daily.      lanthanum (FOSRENOL) 1000 MG chewable tablet Chew 2,000 mg by mouth 3 (three) times daily with meals.      magnesium oxide (MAG-OX) 400 (240 Mg) MG tablet Take 400 mg by mouth 2 (two) times daily.      melatonin 5 MG TABS Take 1 tablet (5 mg total) by mouth at bedtime. 30 tablet 0    metolazone (ZAROXOLYN) 10 MG tablet Take 10 mg by mouth daily.       midodrine (PROAMATINE) 5 MG tablet TAKE 1 TABLET(5 MG) BY MOUTH THREE TIMES DAILY WITH MEALS 270 tablet 3    oxyCODONE (OXY IR/ROXICODONE) 5 MG immediate release tablet Take by mouth.      sevelamer carbonate (RENVELA) 800 MG tablet Take 1,600 mg by mouth 3 (three) times daily with meals.      torsemide (DEMADEX) 100 MG tablet TAKE 1 TABLET BY MOUTH 3 TIMES A WEEK ON NON-DIALYSIS DAYS. 38 tablet 2    WEGOVY 0.25 MG/0.5ML SOAJ Inject 0.25 mg into the skin once a week.       Inpatient Medications:   apixaban  5 mg Oral BID   Chlorhexidine Gluconate Cloth  6 each Topical Q0600   cholecalciferol  1,000 Units Per Tube Daily   darbepoetin (ARANESP) injection - DIALYSIS  200 mcg Subcutaneous Q Sat-1800   [START ON 03/09/2023] digoxin  0.125 mg Oral Once per day on Monday Wednesday  Friday   doxercalciferol  2 mcg Intravenous Q T,Th,Sa-HD   famotidine  20 mg Oral Daily   insulin aspart  0-9 Units Subcutaneous Q4H   lidocaine  1 patch Transdermal Q24H   melatonin  5 mg Oral QHS   midodrine  30 mg Oral TID WC   multivitamin  1 tablet Oral QHS   nutrition supplement (JUVEN)  1 packet Oral BID BM   mouth rinse  15 mL Mouth Rinse 4 times per day   senna-docusate  1 tablet Oral QHS    Allergies:  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, SRA positive   Metoprolol Other (See Comments)    near syncope   Amoxicillin Other (See Comments)    Was hospitalized    Other Swelling and Other (See Comments)    Steroids Fluid seeping out of legs     Family History  Problem Relation Age of Onset   Heart disease Mother    Hypertension Mother    Pulmonary Hypertension Mother    Drug abuse Father        died due to Heroin overdose     Physical Exam: Vitals:   03/07/23 0451 03/07/23 0715  03/07/23 0800 03/07/23 1100  BP: (!) 116/94 93/78 130/70 91/73  Pulse:  (!) 128 (!) 122 (!) 127  Resp:  20  19  Temp: 98.8 F (37.1 C)   98 F (36.7 C)  TempSrc: Oral   Oral  SpO2:  100% 100% 100%  Weight:      Height:        GEN- NAD, A&O x 3, normal affect HEENT: Normocephalic, atraumatic Lungs- CTAB, Normal effort.  Heart- Regular rate and rhythm, No M/G/R.  GI- Soft, NT, ND.  Extremities- No clubbing, cyanosis, or edema   Radiology/Studies:   EKG: today shows AF RVR in 120s (personally reviewed)  TELEMETRY: AF RVR 110-130s (personally reviewed)  Assessment/Plan:  Poorly controlled atrial fibrillation and atrial flutter Rates remain 120-130s today.  Agree with adding digoxin, and following carefully with no other options.  He is not a candidate for transvenous pacing with HD and sores.  AV nodal ablation would lead to RV pacing and he is not a candidate for this.  Leadless pacing access would be very difficult, and unlikely to be helpful for reasons above.  Only AAD option would be amiodarone, though he has had multiple intolerances.   Recent bacteremia Further precludes from advanced procedure consideration, especially devices.   A/C systolic CHF, NICM EF 20-25% Not candidate for advanced procedures [facility] refused transfer with no options.  HF signed off 7/10, recommending palliative measures.   GOC His co-morbidities are likely end stage By notes has previously deferred palliative discussions.   EP to sign off as pt is not candidate for EP procedures. Recommend resuming GOC conversations.   For questions or updates, please contact [facility] Please consult [email or web address] for contact info under Cardiology/STEMI.  Dustin Flock, PA-C  03/07/2023 1:11 PM

## 2023-03-07 NOTE — Progress Notes (Signed)
[facility] Progress Note   Subjective:    Seen and examined patient at bedside. HR I 140s and BP 92/60. Denies SOB, CP, and N/V. Noted issues with his [facility] during his last HD, appears might have been a positional issue. Noted patient was restless and c/o pain d/t pressure ulcers on his buttocks. Next HD 8/13-will ensure he is positioned the best way possible.  Objective Vitals:   03/07/23 0130 03/07/23 0451 03/07/23 0715 03/07/23 0800  BP: 101/62 (!) 116/94 93/78 130/70  Pulse: (!) 111  (!) 128 (!) 122  Resp: 19  20   Temp:  98.8 F (37.1 C)    TempSrc:  Oral    SpO2: 97%  100% 100%  Weight:      Height:       Physical Exam General: chronically ill looking male, [place] O2 Heart:RRR, s1s2 nl Lungs:coarse breath sound b/l Abdomen: soft, +bs Extremities: 1+ bilat hip edema Dialysis Access: RIJ TDC intact, LIJ temp HD catheter running crrt  Filed Weights   03/05/23 0411 03/05/23 1346 03/05/23 1753  Weight: (!) 178 kg (!) 178.5 kg (!) 177.6 kg    Intake/Output Summary (Last 24 hours) at 03/07/2023 1121 Last data filed at 03/07/2023 1100 Gross per 24 hour  Intake 1947.5 ml  Output --  Net 1947.5 ml    Additional Objective Labs: Basic Metabolic Panel: Recent Labs  Lab 03/02/23 0515 03/02/23 1301 03/03/23 0422 03/04/23 0419 03/05/23 0505 03/06/23 0339 03/07/23 0621  NA 134*   < > 133* 133* 131* 132* 128*  K 3.9   < > 4.1 3.9 4.1 4.2 4.8  CL 98  --  98 94* 93* 92* 91*  CO2 21*  --  23 22 21* 20* 18*  GLUCOSE 88  --  94 97 103* 108* 107*  BUN 43*  --  72* 75* 110* 101* 123*  CREATININE 2.14*  --  3.52* 3.60* 5.25* 5.39* 6.82*  CALCIUM 10.0  --  10.7* 10.7* 10.6* 10.7* 10.5*  PHOS 2.3*  --  3.5 3.6  --   --   --    < > = values in this interval not displayed.   Liver Function Tests: Recent Labs  Lab 03/04/23 0419 03/05/23 0505 03/06/23 0339  AST  --  30 29  ALT  --  26 26  ALKPHOS  --  252* 274*  BILITOT  --  0.8 0.7  PROT  --  7.2 7.1   ALBUMIN 2.8* 2.8* 2.8*   No results for input(s): "LIPASE", "AMYLASE" in the last 168 hours. CBC: Recent Labs  Lab 03/01/23 0304 03/01/23 0327 03/05/23 0505 03/06/23 0339 03/07/23 0621  WBC 5.8  --  8.8 6.4 8.3  HGB 7.6*   < > 9.6* 9.4* 8.6*  HCT 24.6*   < > 31.1* 29.9* 27.1*  MCV 107.4*  --  105.1* 106.8* 106.7*  PLT 259  --  361 364 302   < > = values in this interval not displayed.   Blood Culture    Component Value Date/Time   SDES BLOOD RIGHT HAND 02/15/2023 1459   SPECREQUEST  02/15/2023 1459    BOTTLES DRAWN AEROBIC ONLY Blood Culture results may not be optimal due to an inadequate volume of blood received in culture bottles   CULT  02/15/2023 1459    NO GROWTH 5 DAYS Performed at [facility], [address] [place], [place] [address]    REPTSTATUS 02/20/2023 FINAL 02/15/2023 1459  Cardiac Enzymes: No results for input(s): "CKTOTAL", "CKMB", "CKMBINDEX", "TROPONINI" in the last 168 hours. CBG: Recent Labs  Lab 03/06/23 1611 03/06/23 2003 03/06/23 2358 03/07/23 0415 03/07/23 0804  GLUCAP 125* 107* 128* 110* 103*   Iron Studies: No results for input(s): "IRON", "TIBC", "TRANSFERRIN", "FERRITIN" in the last 72 hours. Lab Results  Component Value Date   INR 2.0 (H) 06/10/2022   INR 2.8 (H) 06/09/2022   INR 2.9 (H) 06/07/2022   Studies/Results: No results found.  Medications:  sodium chloride 10 mL/hr at 02/21/23 1444   anticoagulant sodium citrate      apixaban  5 mg Oral BID   Chlorhexidine Gluconate Cloth  6 each Topical Q0600   cholecalciferol  1,000 Units Per Tube Daily   darbepoetin (ARANESP) injection - DIALYSIS  200 mcg Subcutaneous Q Sat-1800   [START ON 03/09/2023] digoxin  0.125 mg Oral Once per day on Monday Wednesday Friday   doxercalciferol  2 mcg Intravenous Q T,Th,Sa-HD   famotidine  20 mg Oral Daily   insulin aspart  0-9 Units Subcutaneous Q4H   lidocaine  1 patch Transdermal Q24H   melatonin  5 mg Oral QHS   midodrine   30 mg Oral TID WC   multivitamin  1 tablet Oral QHS   nutrition supplement (JUVEN)  1 packet Oral BID BM   mouth rinse  15 mL Mouth Rinse 4 times per day   senna-docusate  1 tablet Oral QHS    Dialysis Orders: [facility] TTS  4h   180kg  425/1.5  TDC  Na citrate cath lock (no heparin) - last OP HD was 6/25, post wt 193.8kg - consistently left HD 8-12kg up at least the prior 3 wks - sensipar 60mg  three times per week - hectorol 4 mcg IV three times per week - mircera 200 mcg  IV q 2 wks, last 6/13, due 6/27  Assessment/Plan: # Acute/ chronic hypotension - came off pressors 8/03, cont midodrine 30 tid.    # Pneumococcal bacteremia: admitting diagnosis w/ septic shock. Complex course. Last blood culture 7/23 negative.  Off abx now.    # Acute hypoxic and hypercapnic respiratory failure: extubated on 7/20.  Stable on lower dose of O2 at 3-5 lpm HFNC.    # Volume - mild LE edema, wt's down 178 . Only got 0.8 L off yesterday, see above. Need to keep his volume under control, e.g. ideal wt prob around 173- 176kg    # ESRD - usual HD is TTS. Required emergent HD on 6/29 because of fluid overload.  Started CRRT on 6/29 due to shock.  CRRT dc'd 8/07 as shock had resolved. Back on iHD TTS. Next HD 8/13.  Needs to be positioned in bed properly right before going to dialysis.    # Agitation - while on dialysis, related to decubitus ulcers and associated pain. Before dialysis make sure patient is positioned properly in the bed.    # HD access - 8/2 required temp HD catheter placement after [facility] malfunctioned. IR placed new [facility] 8/06.   # Prognosis - with uncontrolled afib, pressure sores, and other comorbidities prognosis is poor.   # Anemia of CKD:  on max ESA with variable Hbs, generally in the 9s.    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 Have resumed IV hectorol (half dose) since Ca++ levels are back to normal.    # Acute on chronic systolic CHF, nonischemic: EF 16-10%.   #A-fib with RVR with  hypotension:  S/P cardioversion 7/5 -> NSR -> afib RVR 7/6 early AM refractory to [facility] x3 + amiodarone. Persistent afib/flutter w/ HR > 100  Salome Holmes, NP [facility] 03/07/2023,11:21 AM  LOS: 45 days

## 2023-03-08 ENCOUNTER — Other Ambulatory Visit (HOSPITAL_COMMUNITY): Payer: BC Managed Care – PPO

## 2023-03-08 ENCOUNTER — Inpatient Hospital Stay (HOSPITAL_COMMUNITY): Payer: BC Managed Care – PPO

## 2023-03-08 ENCOUNTER — Encounter (HOSPITAL_COMMUNITY): Payer: Self-pay | Admitting: Internal Medicine

## 2023-03-08 DIAGNOSIS — J9601 Acute respiratory failure with hypoxia: Secondary | ICD-10-CM | POA: Diagnosis not present

## 2023-03-08 DIAGNOSIS — J9622 Acute and chronic respiratory failure with hypercapnia: Secondary | ICD-10-CM | POA: Diagnosis not present

## 2023-03-08 DIAGNOSIS — I480 Paroxysmal atrial fibrillation: Secondary | ICD-10-CM | POA: Diagnosis not present

## 2023-03-08 DIAGNOSIS — I5022 Chronic systolic (congestive) heart failure: Secondary | ICD-10-CM | POA: Diagnosis not present

## 2023-03-08 DIAGNOSIS — A419 Sepsis, unspecified organism: Secondary | ICD-10-CM | POA: Diagnosis not present

## 2023-03-08 DIAGNOSIS — J9621 Acute and chronic respiratory failure with hypoxia: Secondary | ICD-10-CM | POA: Diagnosis not present

## 2023-03-08 DIAGNOSIS — N186 End stage renal disease: Secondary | ICD-10-CM | POA: Diagnosis not present

## 2023-03-08 DIAGNOSIS — I5023 Acute on chronic systolic (congestive) heart failure: Secondary | ICD-10-CM | POA: Diagnosis not present

## 2023-03-08 LAB — CBC WITH DIFFERENTIAL/PLATELET
Abs Immature Granulocytes: 0 10*3/uL (ref 0.00–0.07)
Basophils Absolute: 0.1 10*3/uL (ref 0.0–0.1)
Basophils Relative: 1 %
Eosinophils Absolute: 0 10*3/uL (ref 0.0–0.5)
Eosinophils Relative: 0 %
HCT: 26.6 % — ABNORMAL LOW (ref 39.0–52.0)
Hemoglobin: 8.6 g/dL — ABNORMAL LOW (ref 13.0–17.0)
Immature Granulocytes: 0 %
Lymphocytes Relative: 1 %
Lymphs Abs: 0.1 10*3/uL — ABNORMAL LOW (ref 0.7–4.0)
MCH: 33.7 pg (ref 26.0–34.0)
MCHC: 32.3 g/dL (ref 30.0–36.0)
MCV: 104.3 fL — ABNORMAL HIGH (ref 80.0–100.0)
Monocytes Absolute: 0.1 10*3/uL (ref 0.1–1.0)
Monocytes Relative: 1 %
Neutro Abs: 9.4 10*3/uL — ABNORMAL HIGH (ref 1.7–7.7)
Neutrophils Relative %: 97 %
Platelets: 245 10*3/uL (ref 150–400)
RBC: 2.55 MIL/uL — ABNORMAL LOW (ref 4.22–5.81)
RDW: 18.6 % — ABNORMAL HIGH (ref 11.5–15.5)
WBC Morphology: INCREASED
WBC: 9.7 10*3/uL (ref 4.0–10.5)
nRBC: 0 % (ref 0.0–0.2)
nRBC: 0 /100 WBC

## 2023-03-08 LAB — BASIC METABOLIC PANEL
Anion gap: 22 — ABNORMAL HIGH (ref 5–15)
BUN: 153 mg/dL — ABNORMAL HIGH (ref 6–20)
CO2: 18 mmol/L — ABNORMAL LOW (ref 22–32)
Calcium: 9.7 mg/dL (ref 8.9–10.3)
Chloride: 91 mmol/L — ABNORMAL LOW (ref 98–111)
Creatinine, Ser: 8.35 mg/dL — ABNORMAL HIGH (ref 0.61–1.24)
GFR, Estimated: 8 mL/min — ABNORMAL LOW (ref >60)
Glucose, Bld: 93 mg/dL (ref 70–99)
Potassium: 6.1 mmol/L — ABNORMAL HIGH (ref 3.5–5.1)
Sodium: 131 mmol/L — ABNORMAL LOW (ref 135–145)

## 2023-03-08 LAB — GLUCOSE, CAPILLARY
Glucose-Capillary: 68 mg/dL — ABNORMAL LOW (ref 70–99)
Glucose-Capillary: 82 mg/dL (ref 70–99)
Glucose-Capillary: 84 mg/dL (ref 70–99)
Glucose-Capillary: 84 mg/dL (ref 70–99)
Glucose-Capillary: 88 mg/dL (ref 70–99)
Glucose-Capillary: 98 mg/dL (ref 70–99)

## 2023-03-08 LAB — POTASSIUM: Potassium: 5.6 mmol/L — ABNORMAL HIGH (ref 3.5–5.1)

## 2023-03-08 LAB — BLOOD GAS, ARTERIAL
Acid-base deficit: 7 mmol/L — ABNORMAL HIGH (ref 0.0–2.0)
Bicarbonate: 18.6 mmol/L — ABNORMAL LOW (ref 20.0–28.0)
Collection site: 98.6
O2 Saturation: 99.6 %
Patient temperature: 37
pCO2 arterial: 37 mmHg (ref 32–48)
pH, Arterial: 7.31 — ABNORMAL LOW (ref 7.35–7.45)
pO2, Arterial: 127 mmHg — ABNORMAL HIGH (ref 83–108)

## 2023-03-08 LAB — RESP PANEL BY RT-PCR (RSV, FLU A&B, COVID)  RVPGX2
Influenza A by PCR: NEGATIVE
Influenza B by PCR: NEGATIVE
Resp Syncytial Virus by PCR: NEGATIVE
SARS Coronavirus 2 by RT PCR: NEGATIVE

## 2023-03-08 MED ORDER — ALTEPLASE 2 MG IJ SOLR: 2.0000 mg | Freq: Once | INTRAMUSCULAR | Status: DC | PRN

## 2023-03-08 MED ORDER — CEFAZOLIN SODIUM-DEXTROSE 2-4 GM/100ML-% IV SOLN
2.0000 g | Freq: Once | INTRAVENOUS | Status: AC
  Administered 2023-03-08: 2 g via INTRAVENOUS
  Filled 2023-03-08: qty 100

## 2023-03-08 MED ORDER — DEXTROSE 50 % IV SOLN
1.0000 | Freq: Once | INTRAVENOUS | Status: AC
  Administered 2023-03-08: 50 mL via INTRAVENOUS
  Filled 2023-03-08: qty 50

## 2023-03-08 MED ORDER — PENTAFLUOROPROP-TETRAFLUOROETH EX AERO: 1.0000 | INHALATION_SPRAY | CUTANEOUS | Status: DC | PRN

## 2023-03-08 MED ORDER — INSULIN ASPART 100 UNIT/ML IV SOLN
5.0000 [IU] | Freq: Once | INTRAVENOUS | Status: AC
  Administered 2023-03-08: 5 [IU] via INTRAVENOUS

## 2023-03-08 MED ORDER — HALOPERIDOL LACTATE 5 MG/ML IJ SOLN
1.0000 mg | Freq: Once | INTRAMUSCULAR | Status: AC
  Administered 2023-03-08: 1 mg via INTRAVENOUS
  Filled 2023-03-08: qty 0.2

## 2023-03-08 MED ORDER — SODIUM ZIRCONIUM CYCLOSILICATE 10 G PO PACK
10.0000 g | PACK | Freq: Once | ORAL | Status: AC
  Administered 2023-03-08: 10 g via ORAL
  Filled 2023-03-08: qty 1

## 2023-03-08 MED ORDER — SODIUM BICARBONATE 8.4 % IV SOLN
50.0000 meq | Freq: Once | INTRAVENOUS | Status: AC
  Administered 2023-03-08: 50 meq via INTRAVENOUS
  Filled 2023-03-08: qty 50

## 2023-03-08 MED ORDER — DIGOXIN 125 MCG PO TABS
0.1875 mg | ORAL_TABLET | ORAL | Status: DC
  Administered 2023-03-09: 0.1875 mg via ORAL
  Filled 2023-03-08: qty 2

## 2023-03-08 MED ORDER — DEXTROSE 50 % IV SOLN
INTRAVENOUS | Status: AC
  Filled 2023-03-08: qty 50

## 2023-03-08 MED ORDER — DEXTROSE 50 % IV SOLN
12.5000 g | INTRAVENOUS | Status: AC
  Administered 2023-03-08: 12.5 g via INTRAVENOUS

## 2023-03-08 MED ORDER — LIDOCAINE HCL (PF) 1 % IJ SOLN: 5.0000 mL | INTRAMUSCULAR | Status: DC | PRN

## 2023-03-08 MED ORDER — INSULIN ASPART 100 UNIT/ML IJ SOLN
5.0000 [IU] | Freq: Once | INTRAMUSCULAR | Status: AC
  Administered 2023-03-08: 5 [IU] via SUBCUTANEOUS

## 2023-03-08 NOTE — TOC Progression Note (Signed)
Transition of Care [facility]) - Progression Note    Patient Details  Name: Phillip Young MRN: [id number] Date of Birth: Oct 29, 1985  Transition of Care [facility] CM/SW Contact  Eduard Roux, [place] Phone Number: 03/08/2023, 2:19 PM  Clinical Narrative:     TOC continues to follow and will assist with discharge planning.  Antony Blackbird, MSW, LCSW Clinical Social Worker    Expected Discharge Plan: Home/Self Care Barriers to Discharge: Continued Medical Work up  Expected Discharge Plan and Services   Discharge Planning Services: CM Consult   Living arrangements for the past 2 months: Single Family Home                                       Social Determinants of Health (SDOH) Interventions SDOH Screenings   Food Insecurity: No Food Insecurity (01/22/2023)  Housing: Low Risk  (01/22/2023)  Transportation Needs: No Transportation Needs (01/22/2023)  Utilities: Not At Risk (01/22/2023)  Alcohol Screen: Low Risk  (09/21/2022)  Depression (PHQ2-9): High Risk (09/21/2022)  Financial Resource Strain: High Risk (09/21/2022)  Physical Activity: Inactive (09/21/2022)  Social Connections: Unknown (09/21/2022)  Stress: No Stress Concern Present (09/21/2022)  Tobacco Use: Medium Risk (01/21/2023)    Readmission Risk Interventions    06/09/2022   12:19 PM 05/12/2021   10:35 AM 10/16/2020    2:10 PM  Readmission Risk Prevention Plan  Transportation Screening Complete Complete Complete  PCP or Specialist Appt within 5-7 Days   Complete  Home Care Screening   Complete  Medication Review (RN CM)   Referral to Pharmacy  Medication Review (RN Care Manager) Complete Complete   PCP or Specialist appointment within 3-5 days of discharge Complete Complete   HRI or Home Care Consult Complete Complete   SW Recovery Care/Counseling Consult Complete Complete   Palliative Care Screening Not Applicable Complete   Skilled Nursing Facility Not Applicable Complete

## 2023-03-08 NOTE — Progress Notes (Signed)
   03/08/23 2032  BiPAP/CPAP/SIPAP  BiPAP/CPAP/SIPAP Pt Type Adult  BiPAP/CPAP/SIPAP V60  Mask Type Full face mask  Mask Size Extra large  Set Rate 18 breaths/min  Respiratory Rate 8 breaths/min  IPAP 18 cmH20  EPAP 28 cmH2O  FiO2 (%) 45 %  Minute Ventilation 20  Leak 9  Peak Inspiratory Pressure (PIP) 17  Tidal Volume (Vt) 727  Patient Home Equipment No  Auto Titrate No  Press High Alarm 25 cmH2O  Press Low Alarm 5 cmH2O  BiPAP/CPAP /SiPAP Vitals  Pulse Rate (!) 142  Resp (!) 28  BP (!) 100/48  SpO2 100 %  MEWS Score/Color  MEWS Score 8  MEWS Score Color Red

## 2023-03-08 NOTE — Progress Notes (Addendum)
[facility] Progress Note   Subjective:    Seen and examined patient on HD. Informed of ongoing issues with TDC: sluggish arterial flow, moderate arterial pressures, and alarming. Noted TDC recently exchanged on 8/6 by IR. TDC appears positional and issues likely 2nd patient constantly re-positioning himself in the bed. I discussed with primary care team-ordered Haldol 1mg  IV X 1 to see if this can help with current restlessness. Also reached out to IR to get their recommendations. Noted K+ today of 5.9 and noted Lokelma was given early thi AM.   Objective Vitals:   03/08/23 0700 03/08/23 0717 03/08/23 0854 03/08/23 0900  BP: (!) 140/125  95/74 95/74  Pulse:      Resp: (!) 25 20 18  (!) 28  Temp:      TempSrc:      SpO2:      Weight:      Height:       Physical Exam General: chronically ill looking male, obese, [place] O2, appears restless, constantly re-positioning himself in bed Heart:RRR, s1s2 nl Lungs: Clear anteriorly/posteriorly Abdomen: soft, +bs Extremities: No LE edema Dialysis Access: [facility] intact  Filed Weights   03/05/23 0411 03/05/23 1346 03/05/23 1753  Weight: (!) 178 kg (!) 178.5 kg (!) 177.6 kg    Intake/Output Summary (Last 24 hours) at 03/08/2023 0927 Last data filed at 03/08/2023 0700 Gross per 24 hour  Intake 1710 ml  Output --  Net 1710 ml    Additional Objective Labs: Basic Metabolic Panel: Recent Labs  Lab 03/02/23 0515 03/02/23 1301 03/03/23 0422 03/04/23 0419 03/05/23 0505 03/06/23 0339 03/07/23 0621 03/08/23 0430  NA 134*   < > 133* 133*   < > 132* 128* 129*  K 3.9   < > 4.1 3.9   < > 4.2 4.8 5.9*  CL 98  --  98 94*   < > 92* 91* 90*  CO2 21*  --  23 22   < > 20* 18* 19*  GLUCOSE 88  --  94 97   < > 108* 107* 93  BUN 43*  --  72* 75*   < > 101* 123* 149*  CREATININE 2.14*  --  3.52* 3.60*   < > 5.39* 6.82* 7.97*  CALCIUM 10.0  --  10.7* 10.7*   < > 10.7* 10.5* 10.1  PHOS 2.3*  --  3.5 3.6  --   --   --   --    < > =  values in this interval not displayed.   Liver Function Tests: Recent Labs  Lab 03/05/23 0505 03/06/23 0339 03/08/23 0430  AST 30 29 55*  ALT 26 26 49*  ALKPHOS 252* 274* 358*  BILITOT 0.8 0.7 0.8  PROT 7.2 7.1 7.1  ALBUMIN 2.8* 2.8* 2.8*   No results for input(s): "LIPASE", "AMYLASE" in the last 168 hours. CBC: Recent Labs  Lab 03/05/23 0505 03/06/23 0339 03/07/23 0621 03/08/23 0430  WBC 8.8 6.4 8.3 5.9  HGB 9.6* 9.4* 8.6* 8.8*  HCT 31.1* 29.9* 27.1* 28.0*  MCV 105.1* 106.8* 106.7* 102.9*  PLT 361 364 302 288   Blood Culture    Component Value Date/Time   SDES BLOOD RIGHT HAND 02/15/2023 1459   SPECREQUEST  02/15/2023 1459    BOTTLES DRAWN AEROBIC ONLY Blood Culture results may not be optimal due to an inadequate volume of blood received in culture bottles   CULT  02/15/2023 1459    NO GROWTH 5 DAYS Performed at [facility]  [facility], [address] [place], [place] [address]    REPTSTATUS 02/20/2023 FINAL 02/15/2023 1459    Cardiac Enzymes: No results for input(s): "CKTOTAL", "CKMB", "CKMBINDEX", "TROPONINI" in the last 168 hours. CBG: Recent Labs  Lab 03/07/23 1611 03/07/23 2007 03/08/23 0025 03/08/23 0127 03/08/23 0417  GLUCAP 105* 101* 68* 88 84   Iron Studies: No results for input(s): "IRON", "TIBC", "TRANSFERRIN", "FERRITIN" in the last 72 hours. Lab Results  Component Value Date   INR 2.0 (H) 06/10/2022   INR 2.8 (H) 06/09/2022   INR 2.9 (H) 06/07/2022   Studies/Results: No results found.  Medications:  sodium chloride 10 mL/hr at 02/21/23 1444   anticoagulant sodium citrate      apixaban  5 mg Oral BID   Chlorhexidine Gluconate Cloth  6 each Topical Q0600   cholecalciferol  1,000 Units Per Tube Daily   darbepoetin (ARANESP) injection - DIALYSIS  200 mcg Subcutaneous Q Sat-1800   [START ON 03/09/2023] digoxin  0.125 mg Oral Once per day on Monday Wednesday Friday   doxercalciferol  2 mcg Intravenous Q T,Th,Sa-HD   famotidine  20 mg Oral  Daily   lidocaine  1 patch Transdermal Q24H   melatonin  5 mg Oral QHS   midodrine  30 mg Oral TID WC   multivitamin  1 tablet Oral QHS   nutrition supplement (JUVEN)  1 packet Oral BID BM   mouth rinse  15 mL Mouth Rinse 4 times per day   senna-docusate  1 tablet Oral QHS    Dialysis Orders: [facility] TTS  4h   180kg  425/1.5  TDC  Na citrate cath lock (no heparin) - last OP HD was 6/25, post wt 193.8kg - consistently left HD 8-12kg up at least the prior 3 wks - sensipar 60mg  three times per week - hectorol 4 mcg IV three times per week - mircera 200 mcg  IV q 2 wks, last 6/13, due 6/27  Assessment/Plan: # Acute/ chronic hypotension - came off pressors 8/03, cont midodrine 30 tid.    # Pneumococcal bacteremia: admitting diagnosis w/ septic shock. Complex course. Last blood culture 7/23 negative.  Off abx now.    # Acute hypoxic and hypercapnic respiratory failure: extubated on 7/20.  Stable on lower dose of O2 at 3-5 lpm HFNC.    # Volume - mild LE edema, wt's down 178 . Need to keep his volume under control, e.g. ideal wt prob around 173- 176kg    # ESRD - usual HD is TTS. Required emergent HD on 6/29 because of fluid overload.  Started CRRT on 6/29 due to shock.  CRRT dc'd 8/07 as shock had resolved. Back on iHD TTS. Next HD 8/13.  Needs to be positioned in bed properly right before going to dialysis. Noted K+ of 5.9 and Lokelma was given early this AM. On HD.   # Agitation - while on dialysis, related to decubitus ulcers and associated pain. Before dialysis make sure patient is positioned properly in the bed. Discussed with primary care team, I ordered Haldol 1mg  IV X 1 to help with current agitation. Appears this was also given over the weekend for this issues.   # HD access - 8/2 required temp HD catheter placement after [facility] malfunctioned. IR placed new [facility] 8/06. Still have ongoing issues with TDC: sluggish arterial flow, moderate to high arterial pressures despite dwelling TPA  in both ports. I reached out to IR to get their recommendations-awaiting response.   #  Prognosis - with uncontrolled afib, pressure sores, and other comorbidities prognosis is poor.   # Anemia of CKD:  on max ESA with variable Hbs, generally in the 9s.    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 Have resumed IV hectorol (half dose) since Ca++ levels are back to normal.    # Acute on chronic systolic CHF, nonischemic: EF 16-10%.   #A-fib with RVR with hypotension: S/P cardioversion 7/5 -> NSR -> afib RVR 7/6 early AM refractory to [facility] x3 + amiodarone. Persistent afib/flutter w/ HR > 100  ADDENDUM 03/08/23 at 10:55am: Informed by HD RN of TDC not working at all after changing HD set up X 2. I re-consulted IR for them to re-evaluate his current access and to provide recommendations on this issue- Dareen Piano, NP  Salome Holmes, NP [facility] 03/08/2023,9:27 AM  LOS: 46 days

## 2023-03-08 NOTE — Progress Notes (Signed)
Pt found on BIPAP.  ABG ordered, collected and sent to the LAB.  Lab tech made aware

## 2023-03-08 NOTE — Procedures (Addendum)
I was present at this dialysis session. I have reviewed the session itself and made appropriate changes.   Lots of catheter issues. Heparin allergy limits anticoag options. Feel like only option is to see if IR has any ideas to eval and see if anything can be done  [facility] Weights   03/05/23 0411 03/05/23 1346 03/05/23 1753  Weight: (!) 178 kg (!) 178.5 kg (!) 177.6 kg    Recent Labs  Lab 03/04/23 0419 03/05/23 0505 03/08/23 0430  NA 133*   < > 129*  K 3.9   < > 5.9*  CL 94*   < > 90*  CO2 22   < > 19*  GLUCOSE 97   < > 93  BUN 75*   < > 149*  CREATININE 3.60*   < > 7.97*  CALCIUM 10.7*   < > 10.1  PHOS 3.6  --   --    < > = values in this interval not displayed.    Recent Labs  Lab 03/06/23 0339 03/07/23 0621 03/08/23 0430  WBC 6.4 8.3 5.9  HGB 9.4* 8.6* 8.8*  HCT 29.9* 27.1* 28.0*  MCV 106.8* 106.7* 102.9*  PLT 364 302 288    Scheduled Meds:  apixaban  5 mg Oral BID   Chlorhexidine Gluconate Cloth  6 each Topical Q0600   cholecalciferol  1,000 Units Per Tube Daily   darbepoetin (ARANESP) injection - DIALYSIS  200 mcg Subcutaneous Q Sat-1800   [START ON 03/09/2023] digoxin  0.125 mg Oral Once per day on Monday Wednesday Friday   doxercalciferol  2 mcg Intravenous Q T,Th,Sa-HD   famotidine  20 mg Oral Daily   lidocaine  1 patch Transdermal Q24H   melatonin  5 mg Oral QHS   midodrine  30 mg Oral TID WC   multivitamin  1 tablet Oral QHS   nutrition supplement (JUVEN)  1 packet Oral BID BM   mouth rinse  15 mL Mouth Rinse 4 times per day   senna-docusate  1 tablet Oral QHS   Continuous Infusions:  sodium chloride 10 mL/hr at 02/21/23 1444   anticoagulant sodium citrate     PRN Meds:.sodium chloride, acetaminophen **OR** acetaminophen (TYLENOL) oral liquid 160 mg/5 mL, alteplase, anticoagulant sodium citrate, bisacodyl, calamine, docusate sodium **OR** docusate, gabapentin, guaiFENesin-dextromethorphan, haloperidol lactate, hydrocortisone cream, hydrOXYzine,  iohexol, lidocaine, lidocaine (PF), ondansetron (ZOFRAN) IV, mouth rinse, oxyCODONE, pentafluoroprop-tetrafluoroeth, polyethylene glycol, polyvinyl alcohol, senna-docusate, surgical lubricant   Louie Bun,  MD 03/08/2023, 10:08 AM

## 2023-03-08 NOTE — Progress Notes (Signed)
Plan for [facility] exchange by IR tomorrow (03/09/23). Noted repeat K+ of 5.6. Already ordered 2nd dose of Lokelma. Will also order 1 amp sodium bicarb tonight. Patient is NPO after midnight. Re-checking labs in the morning. Made Nephrologist on-call Dr. Malen Gauze aware.  Salome Holmes, NP [facility]

## 2023-03-08 NOTE — Progress Notes (Addendum)
Notified by RN for tachypnea, fever of 101.1, tachypnea, and increased somnolence.  Patient was placed on BiPAP.  On examination, patient was noted to be tachycardic and tachypneic. Pt has hx of uncontrolled afib. He has a prolonged hospitalization course and was admitted on 01/21/23.   [name] vitals were HR 130s, BP 100s/50s. Temp 101.1 RR 20s Gen: Pt laying in bed, has intermittent rigors HENT: bipap Lungs: limited exam due to body habitus and upper airway sounds CV: IRIR, good radial pulses, extremities warm Abd: no TTP, bowel sounds present Neuro: Answers questions appropriately, goes to sleep after interacting easy to arouse   A/P: Pt who is severely ill due to multiple co-morbidities including severe HFrEF, and uncontrolled Afib with RVR, ESRD on HD with new onset fever and worsening oxygen requirement. Of note pt had incomplete dialysis session today due to malfunctioning catheter and planned for catheter placement tomorrow. Given acuity of presentation, will get CBC with differential, BMP, ABG, blood cultures, chest x-ray, and EKG. COVID and flu were ordered given respiratory distress.  We will continue to monitor and add broad spectrum antibiotics after blood cultures are drawn.  Interval Hx: Results show CBC without leukocytosis, BMP shows hyperkalemia at 6.1, bicarb at 18, ABG with 7.31/37/127, respiratory panel negative for flu or covid and blood cultures pending. EKG is without ischemic findings but shows afib with RVR and CXR shows volume overload with development of pleural effusions. Pt was re-evaluated at 10:30 pm and was off Bipap and on 6 L HFNC. He reported improvement in respiratory status. Will hold off antibiotic given improved clinical picture, and no leukocytosis. We will continue to monitor fever curve and hold APAP until temp>101.4. If temp is >101.4 we can start empiric broad spectrum antibiotics. For his hyperkalemia, I ordered lokelma 10 g and 5 units of insulin along with  amp of D50. Will avoid albuterol given HR concern. Plan to repeat CBG and K later tonight. Will avoid lasix given pt's hypotension but expect volume overload to correct with HD tomorrow as pt had incomplete HD today. Last weight was obtained on 08/10 so will get repeat weight and measure it daily.    Morrie Sheldon, MD Eligha Bridegroom. [facility] Internal Medicine Residency, PGY-3  03/08/2023, 9:13 PM

## 2023-03-08 NOTE — Progress Notes (Signed)
Received patient in bed.Awake,alert and oriented x 4.Consent verified.  Access used :Right HD catheter,even with alteplase dwelling ,I was not able to aspirate anything from arterial limb but  it  infused well on power flushing.Initiated treatment on reverse connection ,on low BFR but after few minutes circuit clogged.Second set-up treatment failed too,the the only time I was able to aspirate smoothly from arterial access ,when he was on his left side lying position.Renal N.P made aware.

## 2023-03-08 NOTE — Progress Notes (Signed)
During bedside report, pt observed to be somnolent (difficult to maintain arousal unless physically stimulated), tachycardic (Afib vs. Aflutter w/RVR 130s-170s), hypoxic (SpO2 85% on 6L [place]), febrile (101.1 axillary) and hypotensive (SBP 80s-90s). Bi-Pap applied and provider paged @1954  regarding pt's change in status along with RR RN (@1957 ). Provider to bedside along with RR RN and orders received and implemented. Will continue to monitor.

## 2023-03-08 NOTE — Progress Notes (Signed)
Patient Name: Phillip Young Date of Encounter: 03/08/2023 [facility] Cardiologist: Marca Ancona, MD   Interval Summary  .   37 y.o. 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   The A fib has been difficult to control and he is intolerant to multiple medications.  Which included amiodarone with pulmonary toxicity, Tikosyn adoption due to ESRD, did not tolerate beta-blocker due to significant hypotension.  With Cardizem being avoided due to CHF as well as dronedarone. We are limited on therapies for the patient. Continues with hypoxia/hypercapnic respiratory failure, ICM and a fib RVR. EP has consulted for AV node ablation and leadless PPM but not a candidate.  Today in dialysis lying flat and comfortable.  HE 120s   Vital Signs .    Vitals:   03/08/23 1033 03/08/23 1042 03/08/23 1047 03/08/23 1100  BP: (!) 89/57 99/70 101/68 117/65  Pulse:   (!) 128   Resp: (!) 24 (!) 26 (!) 25 20  Temp:   98.6 F (37 C)   TempSrc:      SpO2:   96%   Weight:      Height:        Intake/Output Summary (Last 24 hours) at 03/08/2023 1102 Last data filed at 03/08/2023 1047 Gross per 24 hour  Intake 1230 ml  Output -300 ml  Net 1530 ml      03/05/2023    5:53 PM 03/05/2023    1:46 PM 03/05/2023    4:11 AM  Last 3 Weights  Weight (lbs) 391 lb 8.6 oz 393 lb 8.3 oz 392 lb 6.7 oz  Weight (kg) 177.6 kg 178.5 kg 178 kg      Telemetry/ECG    A flutter 120s - Personally Reviewed   Echo 01/25/23  --EF 20-25%, no RWMA, mild concentric LVH, LV internal cavity mildly dilated.  EF similar on past echoes dating back to 02/2020   Physical Exam .   GEN: No acute distress.   Neck: No JVD Cardiac: irreg irreg, no murmurs, rubs, or gallops.   Respiratory: Clear to auscultation bilaterally. GI: Soft, nontender, non-distended  MS: No edema  Assessment & Plan .     Atrial fib RVR  --has had multiple DCCV X 3 in past which failed - last DCCV 01/27/23  3 shocks but failed to convert; 01/28/23; 01/30/23 to SR on the 5th only but on the 7th had 3 shocks with sternal pressure and remained in a fib.  Attempted a fib ablation 12/23 at [facility] was discharged in SR, but recurrent a fib a month later. Dronederone avoided due to CHF while taking,  (not a candidate for amiodarone with pulmonary toxicity and elevated LFTs and not a candidate for Tikosyn with ESRD) pt seen by AHF team in July and performed the DCCV and tried using amiodarone but all unsuccessful.   --dig 125 mcg planned for every other day ( 3 times per week on non dialysis days)  dig level today 0.4 prior to dialysis --lenient HR to 120  but HR 130s to 170s.   --pt asking to see Dr. Shirlee Latch or Dr. Gala Romney-- I discussed with Dr. Shirlee Latch and he was not comfortable with the digoxin.  He felt try low dose BB.  He will come by  to say hello when he is in the hospital.   Will defer to Dr. Duke Salvia. But BP down to 77 systolic at times --palliative care did see in July but signed off - unable to talk with family.    Chronic systolic HF --Directed medical therapy due to low blood pressure and intolerances of medications. Not an ICD candidate given obesity and dialysis. Previous discussion not a candidate for advanced therapies  --per I&O +[phone] but just finishing HD now and wt 2 days ago 177.6 Kg down from 179.2 Kg on the 7th --coox  63 >>51 today     For questions or updates, please contact [facility] Please consult [email or web address] for contact info under        Signed, Nada Boozer, NP

## 2023-03-08 NOTE — Consult Note (Signed)
Chief Complaint: HD Catheter malfunctioning. Request is for [facility] catheter exchange with fibrin sheath disruption  Referring Physician(s): Salome Holmes NP  Supervising Physician: Ruel Favors  Patient Status: [facility] - In-pt  History of Present Illness: Phillip Young is a 37 y.o. male History of CHF, PAF, HTN, morbid obesity, A flutter ESRD on HD via a  RIJ tunneled HD catheter. Last exchanged by IR on 8.6.24 (23 cm Palidrome). Presented tot he ED at [facility] on 6.28.24 with chest pain. Found to be septic with a complicated hospital course. Team reporting ongoing issues with dialysis despite TPA and patient positioning. Case reviewed by IR Attending Dr. Miles Costain who recommends [facility] exchange with balloon fibrin sheath disruption. This was communicated to the Team who is in agreement with the plan of care.  Patient alert and laying in bed,calm. States "my ass hurts" Denies any fevers, headache, chest pain, SOB, cough, abdominal pain, nausea, vomiting or bleeding. Return precautions and treatment recommendations and follow-up discussed with the patient's  and his mother. Both who are agreeable with the plan.    Past Medical History:  Diagnosis Date   Acute on chronic respiratory failure with hypoxia ([name]) 04/21/2021   Acute on chronic systolic (congestive) heart failure ([name]) 02/26/2020   Amiodarone toxicity    Anemia    Atrial flutter ([name])    Biventricular congestive heart failure ([name])    Chronic hypoxemic respiratory failure ([name])    Class 3 severe obesity due to excess calories with serious comorbidity and body mass index (BMI) of 50.0 to 59.9 in adult [facility]) 02/26/2020   Essential hypertension 02/26/2020   GERD without esophagitis 02/26/2020   Hidradenitis suppurativa 02/26/2020   NICM (nonischemic cardiomyopathy) ([name])    Obesity hypoventilation syndrome ([name])    OSA (obstructive sleep apnea)    PAF (paroxysmal atrial fibrillation) ([name])    Pneumonia    Prediabetes 02/26/2020     Past Surgical History:  Procedure Laterality Date   ABSCESS DRAINAGE     AV FISTULA PLACEMENT Left 08/21/2021   Procedure: LEFT ARM ARTERIOVENOUS (AV) FISTULA.;  Surgeon: Nada Libman, MD;  Location: [facility] OR;  Service: Vascular;  Laterality: Left;   CARDIAC CATHETERIZATION     CARDIOVERSION N/A 10/09/2021   Procedure: CARDIOVERSION;  Surgeon: Laurey Morale, MD;  Location: [facility] ENDOSCOPY;  Service: Cardiovascular;  Laterality: N/A;   CARDIOVERSION N/A 05/28/2022   Procedure: CARDIOVERSION;  Surgeon: Laurey Morale, MD;  Location: [facility] ENDOSCOPY;  Service: Cardiovascular;  Laterality: N/A;   CARDIOVERSION N/A 06/07/2022   Procedure: CARDIOVERSION;  Surgeon: Laurey Morale, MD;  Location: [facility] ENDOSCOPY;  Service: Cardiovascular;  Laterality: N/A;   IR FLUORO GUIDE CV LINE RIGHT  03/10/2021   IR FLUORO GUIDE CV LINE RIGHT  04/22/2021   IR FLUORO GUIDE CV LINE RIGHT  08/20/2021   IR FLUORO GUIDE CV LINE RIGHT  03/01/2023   IR US GUIDE VASC ACCESS RIGHT  03/10/2021   IR US GUIDE VASC ACCESS RIGHT  04/22/2021   RIGHT HEART CATH N/A 03/06/2021   Procedure: RIGHT HEART CATH;  Surgeon: Dolores Patty, MD;  Location: [facility] INVASIVE CV LAB;  Service: Cardiovascular;  Laterality: N/A;   RIGHT/LEFT HEART CATH AND CORONARY ANGIOGRAPHY N/A 03/04/2020   Procedure: RIGHT/LEFT HEART CATH AND CORONARY ANGIOGRAPHY;  Surgeon: Laurey Morale, MD;  Location: [facility] INVASIVE CV LAB;  Service: Cardiovascular;  Laterality: N/A;   TEE WITHOUT CARDIOVERSION N/A 05/05/2021   Procedure: TRANSESOPHAGEAL ECHOCARDIOGRAM (TEE);  Surgeon: Laurey Morale, MD;  Location: [facility] ENDOSCOPY;  Service: Cardiovascular;  Laterality: N/A;   TEMPORARY DIALYSIS CATHETER  03/06/2021   Procedure: TEMPORARY DIALYSIS CATHETER;  Surgeon: Dolores Patty, MD;  Location: [facility] INVASIVE CV LAB;  Service: Cardiovascular;;    Allergies: Amiodarone, Coreg [carvedilol], Heparin, Metoprolol, Amoxicillin, and Other  Medications: Prior to  Admission medications   Medication Sig Start Date End Date Taking? Authorizing Provider  apixaban (ELIQUIS) 5 MG TABS tablet Take 1 tablet (5 mg total) by mouth 2 (two) times daily. 08/18/22  Yes Steffanie Rainwater, MD  calamine lotion Apply 1 Application topically 2 (two) times daily. 08/13/22   Adron Bene, MD  camphor-menthol [facility]) lotion Apply topically as needed for itching. 08/13/22   Adron Bene, MD  clindamycin (CLINDAGEL) 1 % gel Apply topically 2 (two) times daily. Apply to hydradenitis wounds 08/13/22   Belva Agee, MD  fluticasone furoate-vilanterol (BREO ELLIPTA) 100-25 MCG/ACT AEPB INHALE 1 PUFF INTO THE LUNGS DAILY 10/13/22   Belva Agee, MD  gabapentin (NEURONTIN) 100 MG capsule Take by mouth. 09/17/21   [provider]  hydrOXYzine (ATARAX) 25 MG tablet Take 25 mg by mouth 2 (two) times daily. 10/13/22   [provider]  lanthanum (FOSRENOL) 1000 MG chewable tablet Chew 2,000 mg by mouth 3 (three) times daily with meals.    [provider]  magnesium oxide (MAG-OX) 400 (240 Mg) MG tablet Take 400 mg by mouth 2 (two) times daily. 08/04/22   [provider]  melatonin 5 MG TABS Take 1 tablet (5 mg total) by mouth at bedtime. 06/10/22   Crissie Sickles, MD  metolazone (ZAROXOLYN) 10 MG tablet Take 10 mg by mouth daily. 08/04/22   [provider]  midodrine (PROAMATINE) 5 MG tablet TAKE 1 TABLET(5 MG) BY MOUTH THREE TIMES DAILY WITH MEALS 06/10/22   Katsadouros, Vasilios, MD  oxyCODONE (OXY IR/ROXICODONE) 5 MG immediate release tablet Take by mouth. 08/04/22   [provider]  sevelamer carbonate (RENVELA) 800 MG tablet Take 1,600 mg by mouth 3 (three) times daily with meals. 08/04/22   [provider]  torsemide (DEMADEX) 100 MG tablet TAKE 1 TABLET BY MOUTH 3 TIMES A WEEK ON NON-DIALYSIS DAYS. 08/18/22   Steffanie Rainwater, MD  WEGOVY 0.25 MG/0.5ML SOAJ Inject 0.25 mg into the skin once a week. 01/11/23    [provider]     Family History  Problem Relation Age of Onset   Heart disease Mother    Hypertension Mother    Pulmonary Hypertension Mother    Drug abuse Father        died due to Heroin overdose    Social History   Socioeconomic History   Marital status: Single    Spouse name: Not on file   Number of children: Not on file   Years of education: Not on file   Highest education level: Not on file  Occupational History   Not on file  Tobacco Use   Smoking status: Former    Current packs/day: 0.00    Types: Cigarettes    Quit date: 2019    Years since quitting: 5.6    Passive exposure: Current   Smokeless tobacco: Former   Tobacco comments:    quit in 2019  Vaping Use   Vaping status: Never Used  Substance and Sexual Activity   Alcohol use: Never   Drug use: Never   Sexual activity: Not on file  Other Topics Concern   Not on file  Social History Narrative  Not on file   Social Determinants of Health   Financial Resource Strain: High Risk (09/21/2022)   Overall Financial Resource Strain (CARDIA)    Difficulty of Paying Living Expenses: Hard  Food Insecurity: No Food Insecurity (01/22/2023)   Hunger Vital Sign    Worried About Running Out of Food in the Last Year: Never true    Ran Out of Food in the Last Year: Never true  Transportation Needs: No Transportation Needs (01/22/2023)   PRAPARE - Administrator, Civil Service (Medical): No    Lack of Transportation (Non-Medical): No  Physical Activity: Inactive (09/21/2022)   Exercise Vital Sign    Days of Exercise per Week: 0 days    Minutes of Exercise per Session: 0 min  Stress: No Stress Concern Present (09/21/2022)   [employer] of Occupational Health - Occupational Stress Questionnaire    Feeling of Stress : Not at all  Social Connections: Unknown (09/21/2022)   Social Connection and Isolation Panel [NHANES]    Frequency of Communication with Friends and Family: More than three  times a week    Frequency of Social Gatherings with Friends and Family: Never    Attends Religious Services: Not on Marketing executive or Organizations: Not on file    Attends Banker Meetings: Never    Marital Status: Never married    Review of Systems: A 12 point ROS discussed and pertinent positives are indicated in the HPI above.  All other systems are negative.  Review of Systems  Constitutional:  Negative for fever.  HENT:  Negative for congestion.   Respiratory:  Negative for cough and shortness of breath.   Cardiovascular:  Negative for chest pain.  Gastrointestinal:  Negative for abdominal pain.  Musculoskeletal:  Positive for myalgias ("my ass hurts").  Neurological:  Negative for headaches.  Psychiatric/Behavioral:  Negative for behavioral problems and confusion.     Vital Signs: BP 117/65   Pulse (!) 128   Temp 98.6 F (37 C)   Resp 20   Ht 6' (1.829 m)   Wt (!) 391 lb 8.6 oz (177.6 kg)   SpO2 96%   BMI 53.10 kg/m     Physical Exam Vitals and nursing note reviewed.  Constitutional:      Appearance: He is well-developed. He is obese.  HENT:     Head: Normocephalic.  Cardiovascular:     Rate and Rhythm: Regular rhythm. Tachycardia present.  Pulmonary:     Comments: On O2 via nasal cannula   Musculoskeletal:        General: Normal range of motion.     Cervical back: Normal range of motion.  Skin:    General: Skin is warm and dry.  Neurological:     Mental Status: He is alert and oriented to person, place, and time. Mental status is at baseline.     Imaging: DG Abd Portable 1V  Result Date: 03/04/2023 CLINICAL DATA:  Check feeding catheter placement EXAM: PORTABLE ABDOMEN - 1 VIEW COMPARISON:  None Available. FINDINGS: Weighted feeding catheter is noted within the stomach. The catheter is coiled upon itself in the distal stomach. IMPRESSION: Weighted feeding catheter in the distal stomach as described. Electronically Signed    By: Alcide Clever M.D.   On: 03/04/2023 21:57   IR Fluoro Guide CV Line Right  Result Date: 03/01/2023 INDICATION: 37 year old with end-stage renal disease and nonfunctioning tunneled dialysis catheter. Patient presents for tunneled catheter exchange. EXAM:  EXCHANGE OF TUNNELED DIALYSIS CATHETER WITH FLUOROSCOPY Physician: Rachelle Hora. Lowella Dandy, MD MEDICATIONS: Vancomycin 1 g ANESTHESIA/SEDATION: The patient was continuously monitored during the procedure by the interventional radiology nurse under my direct supervision. FLUOROSCOPY: Radiation Exposure Index (as provided by the fluoroscopic device): 72 mGy Kerma COMPLICATIONS: None immediate. PROCEDURE: Informed consent was obtained for exchange of the tunneled dialysis catheter. Right neck and chest were prepped and draped in sterile fashion. The existing catheter was prepped and draped in sterile fashion. Maximal barrier sterile technique was utilized including caps, mask, sterile gowns, sterile gloves, sterile drape, hand hygiene and skin antiseptic. Blood was aspirated from both lumens. Skin around the catheter exit site was anesthetized using 1% lidocaine and the catheter cuff was exposed using blunt dissection. Old catheter was removed over a stiff Glidewire. New 23 cm Palindrome catheter was advanced over the wire and positioned in the right atrium. Initially, the catheter did not aspirate well from either lumen. Approximately 8 mL contrast was injected and demonstrated a fibrin sheath. As a result, the stiff Glidewire was advanced through 1 of the lumens and attempted to reposition outside of the fibrin sheath. Catheter was advanced further into the right atrium. Both lumens aspirated and flushed well. Both lumens were only flushed with saline because the patient was going to be starting dialysis immediately. Catheter was sutured to skin. Fluoroscopic images were taken and saved for this procedure. FINDINGS: Evidence for a fibrin sheath near the superior cavoatrial  junction. Catheter tip was positioned in the right atrium. Both lumens were aspirating and flushing well at the end of the procedure. IMPRESSION: Successful exchange of the tunneled dialysis catheter using fluoroscopy. Electronically Signed   By: Richarda Overlie M.D.   On: 03/01/2023 17:10   DG CHEST PORT 1 VIEW  Result Date: 02/28/2023 CLINICAL DATA:  Respiratory distress. EXAM: PORTABLE CHEST 1 VIEW COMPARISON:  February 25, 2023. FINDINGS: Stable cardiomegaly with mild central pulmonary vascular congestion. Feeding tube is seen entering stomach. Bilateral internal jugular catheters are unchanged in position. Mild bilateral pulmonary edema cannot be excluded. Probable left basilar atelectasis or infiltrate is noted with effusion. Bony thorax is unremarkable. IMPRESSION: Stable cardiomegaly with mild central pulmonary vascular congestion and mild bilateral pulmonary edema. Stable support apparatus. Probable left basilar atelectasis or infiltrate with small pleural effusion. Electronically Signed   By: Lupita Raider M.D.   On: 02/28/2023 13:41   DG CHEST PORT 1 VIEW  Result Date: 02/25/2023 CLINICAL DATA:  Central line placement. EXAM: PORTABLE CHEST 1 VIEW COMPARISON:  Radiograph 02/21/2023 FINDINGS: New dual lumen left internal jugular central venous catheter tip overlies the mid SVC. Left subclavian or upper extremity small bore catheter tip is difficult to accurately define, visualized at least to the level of the SVC. There is a right-sided central line with tip coursing to the midline, position uncertain on this exam. Weighted enteric tube tip is in place, tip not included in the field of view. Left lung base not included in the field of view. No pneumothorax. Cardiomegaly is stable. Heterogeneous bilateral lung opacities which may be secondary to edema or infection, stable. IMPRESSION: 1. New dual lumen left internal jugular central venous catheter tip overlies the mid SVC. Left subclavian or upper extremity  small bore catheter tip is difficult to accurately define, visualized at least to the level of the SVC. No pneumothorax. 2. Right-sided central line with tip coursing to the midline, position uncertain on this exam. 3. Stable cardiomegaly and heterogeneous bilateral lung opacities which  may be secondary to edema or infection. Electronically Signed   By: Narda Rutherford M.D.   On: 02/25/2023 17:50   DG CHEST PORT 1 VIEW  Result Date: 02/21/2023 CLINICAL DATA:  PICC line placement EXAM: PORTABLE CHEST 1 VIEW COMPARISON:  02/18/2023, CT 05/31/2022 FINDINGS: Interim placement of left upper extremity central venous catheter with tip at the right atrial region allowing for patient positioning and rotation. Right-sided central venous catheter tip also at the right atrium. Esophageal tube tip below the diaphragm but incompletely visualized. Cardiomegaly with vascular congestion and hazy appearance of the lung fields probably due to edema. IMPRESSION: 1. Interim placement of left upper extremity central venous catheter with tip at the right atrial region, estimated to be about 2 to 3 cm distal to the cavoatrial region. Note that the patient is markedly rotated 2. Cardiomegaly with vascular congestion and probable edema. Electronically Signed   By: Jasmine Pang M.D.   On: 02/21/2023 22:52   [place] EKG SITE RITE  Result Date: 02/21/2023 If Site Rite image not attached, placement could not be confirmed due to current cardiac rhythm.  DG Abd Portable 1V  Result Date: 02/18/2023 CLINICAL DATA:  [id number] Encounter for feeding tube placement [id number] EXAM: PORTABLE ABDOMEN - 1 VIEW COMPARISON:  02/14/2023 FINDINGS: feeding tube extends at least as far as the gastric antrum, tip not seen. Dialysis catheter to the proximal right atrium. Paucity of bowel gas in the visualized upper abdomen. Lower abdomen excluded. IMPRESSION: Feeding tube at least as far as the gastric antrum. Electronically Signed   By: Corlis Leak M.D.   On:  02/18/2023 17:00   DG CHEST PORT 1 VIEW  Result Date: 02/18/2023 CLINICAL DATA:  Respiratory distress. EXAM: PORTABLE CHEST 1 VIEW COMPARISON:  Chest x-ray 02/12/2023 FINDINGS: Marked enlargement of the cardiac silhouette. Pulmonary vascular congestion. Pulmonary edema. Retrocardiac opacities. Right IJ approach dual lumen cardiac rhythm maintenance device with the tip projecting at the right atrium. Left IJ approach central venous catheter with tip projecting at the superior cavoatrial junction. Gastric tube courses below diaphragm with the tip outside the field of view. IMPRESSION: 1. Cardiomegaly, pulmonary vascular congestion and probable interstitial pulmonary edema. 2. Retrocardiac opacities, most likely atelectasis. Infection not excluded. Dedicated PA and lateral radiographs could better assess if clinically warranted. Electronically Signed   By: Feliberto Harts M.D.   On: 02/18/2023 12:11   DG Abd Portable 1V  Result Date: 02/14/2023 CLINICAL DATA:  Check feeding catheter placement EXAM: PORTABLE ABDOMEN - 1 VIEW COMPARISON:  None Available. FINDINGS: Weighted feeding catheter extends into the midportion of the stomach. IMPRESSION: Feeding catheter within the stomach. Electronically Signed   By: Alcide Clever M.D.   On: 02/14/2023 18:05   DG CHEST PORT 1 VIEW  Result Date: 02/12/2023 CLINICAL DATA:  Extubation. EXAM: PORTABLE CHEST 1 VIEW COMPARISON:  Chest radiograph dated 02/10/2023. FINDINGS: Interval removal of the endotracheal tube. Additional support apparatus as seen before. There is stable cardiomegaly. There is vascular congestion. Bibasilar densities may represent atelectasis. Pneumonia is not excluded. Overall slight worsening of aeration of the lungs since the prior radiograph. No acute osseous pathology. IMPRESSION: 1. Interval removal of the endotracheal tube. 2. Slight worsening of aeration of the lungs. Electronically Signed   By: Elgie Collard M.D.   On: 02/12/2023 20:45    DG Abd Portable 1V  Result Date: 02/12/2023 CLINICAL DATA:  Tube placement EXAM: PORTABLE ABDOMEN - 1 VIEW COMPARISON:  Abdominal x-ray 02/07/2023 FINDINGS: Enteric tube tip is  at the level of the gastric body. The heart is enlarged. No dilated bowel loops are seen in the upper abdomen. IMPRESSION: Enteric tube tip is at the level of the gastric body. Electronically Signed   By: Darliss Cheney M.D.   On: 02/12/2023 20:44   DG CHEST PORT 1 VIEW  Result Date: 02/10/2023 CLINICAL DATA:  Shortness of breath EXAM: PORTABLE CHEST 1 VIEW COMPARISON:  Chest radiograph 02/09/2023 FINDINGS: The right IJ vascular catheter is stable terminating in the right atrium. The left IJ vascular catheter terminating in the lower SVC/cavoatrial junction is stable. The endotracheal tube tip terminates approximately 4.7 cm from the carina. The enteric catheter tip is off the field of view. Cardiomegaly is unchanged. The upper mediastinal contours are stable. Vascular congestion and pulmonary interstitial edema are unchanged. There is no new or worsening focal airspace disease. There is no significant pleural effusion, though the left costophrenic angle is excluded from the field of view. There is no pneumothorax There is no acute osseous abnormality. IMPRESSION: Unchanged cardiomegaly and pulmonary interstitial edema. Electronically Signed   By: Lesia Hausen M.D.   On: 02/10/2023 08:02   DG CHEST PORT 1 VIEW  Result Date: 02/09/2023 CLINICAL DATA:  Shortness of breath. EXAM: PORTABLE CHEST 1 VIEW COMPARISON:  02/08/2023 FINDINGS: Endotracheal tube tip is 4 cm above the base of the carina. Right IJ central line tip overlies the right atrium near the junction with the SVC. Left IJ central line tip overlies the SVC/RA junction. The NG tube passes into the stomach although the distal tip position is not included on the film. The cardio pericardial silhouette is enlarged. Vascular congestion with diffuse interstitial opacity  consistent with edema. No substantial pleural effusion. Telemetry leads overlie the chest. IMPRESSION: 1. Support apparatus as described. 2. Vascular congestion with diffuse interstitial opacity consistent with edema. No substantial change. Electronically Signed   By: Kennith Center M.D.   On: 02/09/2023 06:33   DG CHEST PORT 1 VIEW  Result Date: 02/08/2023 CLINICAL DATA:  Shortness of breath EXAM: PORTABLE CHEST 1 VIEW COMPARISON:  Previous studies including the examination of 02/06/2023 FINDINGS: Transverse diameter of heart is increased. There is decreasing pulmonary vascular congestion. Still, there is prominence of central pulmonary vessels and increased interstitial markings in the parahilar regions and lower lung fields. Tip of endotracheal tube is 4.7 cm above the carina. Tip of dialysis catheter is seen in the region of right atrium. Tip of left IJ central venous catheter is seen in superior vena cava. NG tube is noted traversing the esophagus. IMPRESSION: Cardiomegaly. There is partial clearing of signs of CHF. No new focal infiltrates are seen. Electronically Signed   By: Ernie Avena M.D.   On: 02/08/2023 08:26   DG Abd Portable 1V  Result Date: 02/07/2023 CLINICAL DATA:  Distension EXAM: PORTABLE ABDOMEN - 1 VIEW COMPARISON:  02/01/2023 FINDINGS: Esophageal tube tip and side port in the left upper quadrant. Limited by body habitus. Some air within the colon. No convincing dilated small bowel. IMPRESSION: Very limited by habitus.  Nonobstructed gas pattern Electronically Signed   By: Jasmine Pang M.D.   On: 02/07/2023 23:05    Labs:  CBC: Recent Labs    03/05/23 0505 03/06/23 0339 03/07/23 0621 03/08/23 0430  WBC 8.8 6.4 8.3 5.9  HGB 9.6* 9.4* 8.6* 8.8*  HCT 31.1* 29.9* 27.1* 28.0*  PLT 361 364 302 288    COAGS: Recent Labs    03/26/22 0341 06/07/22 1655 06/09/22 0512  06/10/22 0452  INR 1.2 2.9* 2.8* 2.0*  APTT 31  --   --   --     BMP: Recent Labs     03/05/23 0505 03/06/23 0339 03/07/23 0621 03/08/23 0430  NA 131* 132* 128* 129*  K 4.1 4.2 4.8 5.9*  CL 93* 92* 91* 90*  CO2 21* 20* 18* 19*  GLUCOSE 103* 108* 107* 93  BUN 110* 101* 123* 149*  CALCIUM 10.6* 10.7* 10.5* 10.1  CREATININE 5.25* 5.39* 6.82* 7.97*  GFRNONAA 14* 13* 10* 8*    LIVER FUNCTION TESTS: Recent Labs    01/21/23 1739 01/21/23 1842 03/04/23 0419 03/05/23 0505 03/06/23 0339 03/08/23 0430  BILITOT 2.3*  --   --  0.8 0.7 0.8  AST 18  --   --  30 29 55*  ALT 11  --   --  26 26 49*  ALKPHOS 97  --   --  252* 274* 358*  PROT 7.6  --   --  7.2 7.1 7.1  ALBUMIN 3.4*   < > 2.8* 2.8* 2.8* 2.8*   < > = values in this interval not displayed.     Assessment and Plan:  37 y.o. male inpatient. History of CHF, PAF, HTN, morbid obesity, A flutter ESRD on HD via a  RIJ tunneled HD catheter. Last exchanged by IR on 8.6.24 (23 cm Palidrome). Presented tot he ED at [facility] on 6.28.24 with chest pain. Found to be septic with a complicated hospital course. Team reporting ongoing issues with dialysis despite TPA and patient positioning. Case reviewed by IR Attending Dr. Miles Costain who recommends [facility] exchange with balloon fibrin sheath disruption. This was communicated to the Team who is in agreement with the plan of care.   Potassium 5.9, BUN 149, Cr 7.97, alkaline phosphatase, Albumin 2.8, AST 55, ALT 49.  Patient is on eliquis. Last dose given on 8.13.24 @ 11:56 and Patient is on subcutaneous prophylactic dose of lovenox. All other labs and mediations are within acceptable parameters. Allergies include amoxicillin. Per RN Patient has been NPO since midnight.   Risks and benefits discussed with the patient's mother including, but not limited to bleeding, infection, vascular injury, pneumothorax which may require chest tube placement, air embolism or even death  All of the patient's mother's questions were answered, patient is agreeable to proceed. Consent signed and in IR  Box    Thank you for this interesting consult.  I greatly enjoyed meeting Phillip Young and look forward to participating in their care.  A copy of this report was sent to the requesting provider on this date.  Electronically Signed: Alene Mires, NP 03/08/2023, 12:11 PM   I spent a total of 20 Minutes    in face to face in clinical consultation, greater than 50% of which was counseling/coordinating care for [facility] catheter exchange with fibrin sheath disruption

## 2023-03-08 NOTE — Progress Notes (Signed)
    Latest Reference Range & Units 03/07/23 16:11 03/07/23 20:07 03/08/23 00:25  Glucose-Capillary 70 - 99 mg/dL 161 (H) 096 (H) 68 (L)  (H): Data is abnormally high (L): Data is abnormally low   CBG 68 mg/dl at 04.54 am per documented  above. Hypoglycemic standing order initiated. 12.5 g of dextrose IV given stat due to Pt is on BIPAP at night. His SPO2 100% on BIPAP, HR 120s, Atrial fib on the monitor, BP remains stable 92/60-127/54mmHg, RR 24-28. We will monitor.   Filiberto Pinks, RN

## 2023-03-08 NOTE — Progress Notes (Signed)
Subjective:   Summary: Phillip Young is a 37 y.o. year old male currently admitted on the IMT[facility] HD#46 for Acute on chronic hypoxic respiratory failure, ESRD on HD.  Overnight Events: Patient was hypoglycemic, given dextrose. Tube feeds were discontinued  Subjective:  Saw patient at bedside this morning. Patient reported he used BiPAP last night and was feeling very good. Discussed dialysis today and the importance of staying for the entire session. Patient is very optimistic he will get better and his tachycardia will improve.   Objective:  Vital signs in last 24 hours: Vitals:   03/08/23 0854 03/08/23 0900 03/08/23 0945 03/08/23 1003  BP: 95/74 95/74 (!) 115/92 100/63  Pulse:    (!) 122  Resp: 18 (!) 28 (!) 29 (!) 23  Temp:      TempSrc:      SpO2:    98%  Weight:      Height:       Supplemental O2: HFNC SpO2: 98 % O2 Flow Rate (L/min): 4 L/min FiO2 (%): (S) 30 %   Physical Exam:  Constitutional: chronically ill appearing, laying in bed, in no acute distress. Tremulous Cardiovascular: RRR, no murmurs, rubs or gallops Pulmonary/Chest: normal work of breathing on room air, lungs clear to auscultation bilaterally Abdominal: soft, non-tender, non-distended Skin: warm and dry Extremities: upper/lower extremity pulses 2+, no lower extremity edema present  Filed Weights   03/05/23 0411 03/05/23 1346 03/05/23 1753  Weight: (!) 178 kg (!) 178.5 kg (!) 177.6 kg     Intake/Output Summary (Last 24 hours) at 03/08/2023 1012 Last data filed at 03/08/2023 0700 Gross per 24 hour  Intake 1470 ml  Output --  Net 1470 ml   Net IO Since Admission: 11,743.86 mL [03/08/23 1012]  Pertinent Labs:    Latest Ref Rng & Units 03/08/2023    4:30 AM 03/07/2023    6:21 AM 03/06/2023    3:39 AM  CBC  WBC 4.0 - 10.5 K/uL 5.9  8.3  6.4   Hemoglobin 13.0 - 17.0 g/dL 8.8  8.6  9.4   Hematocrit 39.0 - 52.0 % 28.0  27.1  29.9   Platelets 150 - 400 K/uL 288  302  364         Latest Ref Rng & Units 03/08/2023    4:30 AM 03/07/2023    6:21 AM 03/06/2023    3:39 AM  CMP  Glucose 70 - 99 mg/dL 93  409  811   BUN 6 - 20 mg/dL 914  782  956   Creatinine 0.61 - 1.24 mg/dL 2.13  0.86  5.78   Sodium 135 - 145 mmol/L 129  128  132   Potassium 3.5 - 5.1 mmol/L 5.9  4.8  4.2   Chloride 98 - 111 mmol/L 90  91  92   CO2 22 - 32 mmol/L 19  18  20    Calcium 8.9 - 10.3 mg/dL 46.9  62.9  52.8   Total Protein 6.5 - 8.1 g/dL 7.1   7.1   Total Bilirubin 0.3 - 1.2 mg/dL 0.8   0.7   Alkaline Phos 38 - 126 U/L 358   274   AST 15 - 41 U/L 55   29   ALT 0 - 44 U/L 49   26     Imaging: No results found.   Assessment/Plan:   Principal Problem:   Acute hypoxic respiratory  failure [facility]) Active Problems:   Morbid obesity ([name])   Chronic systolic CHF (congestive heart failure) ([name])   Cardiogenic shock ([name])   ESRD (end stage renal disease) ([name])   ESRD (end stage renal disease) on dialysis ([name])   ESRD on dialysis ([name])   Hypervolemia   Sepsis with acute organ dysfunction without septic shock ([name])   Atrial fibrillation with RVR ([name])   Pressure injury of skin   Patient Summary: Phillip Young is a 37 y.o. with a pertinent PMH of chronic hypoxic respiratory failure, ESRD, HFrEF with EF 20-25%, refractory Afib, amiodarone induced lung toxicity, who presented with fevers, joint pains, SOB and admitted for sepsis, now resolved.   #Acute on chronic hypoxemic/hypercapnic respiratory failure #Acute metabolic encephalopathy in setting of hypercapnia Patient was transferred from ICU on 8/9 after extubation and pressors were discontinued. Mental status has improved, patient is currently alert and following commands.  -Currently on 5L HFNC with O2 sats in mid 90s -Noncompliant with CPAP at home, nurses note patient has been refusing BiPAP at night -Encourage incentive spirometry and PT/OT  #Acute on chronic HFrEF with pulmonary edema #Paroxysmal Afib/flutter with  RVR #Nonischemic cardiomyopathy EF less than 20% -Remains in Afib with RVR, HR ranging up to 150s -Patient intolerant of metoprolol and carvedilol due to allergy, amiodarone due to lung toxicity, diltiazem due to extremely reduced EF -Cardiology consulting, recommending digoxin every other day (non-dialysis days) and testing digoxin levels every dialysis morning. Appreciate recs -Digoxin level today 0.8 -Continue Eliquis for stroke prophylaxis  #ESRD on HD T/Th/S #Recurrent metabolic acidosis, improving -Nephrology following, appreciate assistance -HD scheduled T/Th/S. Patient has had trouble tolerating due to skin breakdown and TDC clotting. Haldol has been administered for agitation and IR consulted for catheter problems -Midodrine 30 TID for hypotension  #Mixed Septic and Cardiogenic Shock -Resolved, transferred to floor from ICU on 8/9  #Hypervolemic hyponatremia #Hyperphosphatemia #Hyperkalemia #Hypocalcemia -Monitor electrolytes with daily labs, supplement as necessary -Tube feeds d/c 8/12. Monitor blood glucose with frequent checks  #Anemia of chronic disease Monitor with daily labs, transfuse as needed  #Skin breakdown Perineal and sacral skin breakdown. Patient refuses rectal tube -Continue local wound care -Consider reconsulting wound care as pain has made patient intolerant of completing dialysis sessions  Diet: Normal IVF: None,   VTE: DOAC Code: Full PT/OT recs:   Dispo: Anticipated discharge to Skilled nursing facility pending medical stability Monna Fam, MD PGY-1 Internal Medicine Resident Pager Number [phone] Please contact the on call pager after 5 pm and on weekends at [phone].

## 2023-03-08 NOTE — Progress Notes (Signed)
SLP Cancellation Note  Patient Details Name: Phillip Young MRN: [id number] DOB: [birth year]   Cancelled treatment:        Attempted to see pt for ongoing dysphagia management.  Pt is off floor for HD at this time.  SLP will continue to follow.    Kerrie Pleasure, MA, CCC-SLP Acute Rehabilitation Services Office: [phone] 03/08/2023, 9:39 AM

## 2023-03-09 ENCOUNTER — Inpatient Hospital Stay (HOSPITAL_COMMUNITY): Payer: BC Managed Care – PPO

## 2023-03-09 DIAGNOSIS — I5023 Acute on chronic systolic (congestive) heart failure: Secondary | ICD-10-CM | POA: Diagnosis not present

## 2023-03-09 DIAGNOSIS — J9601 Acute respiratory failure with hypoxia: Secondary | ICD-10-CM | POA: Diagnosis not present

## 2023-03-09 DIAGNOSIS — N186 End stage renal disease: Secondary | ICD-10-CM | POA: Diagnosis not present

## 2023-03-09 DIAGNOSIS — A419 Sepsis, unspecified organism: Secondary | ICD-10-CM | POA: Diagnosis not present

## 2023-03-09 DIAGNOSIS — R570 Cardiogenic shock: Secondary | ICD-10-CM | POA: Diagnosis not present

## 2023-03-09 DIAGNOSIS — I480 Paroxysmal atrial fibrillation: Secondary | ICD-10-CM | POA: Diagnosis not present

## 2023-03-09 DIAGNOSIS — J9622 Acute and chronic respiratory failure with hypercapnia: Secondary | ICD-10-CM | POA: Diagnosis not present

## 2023-03-09 DIAGNOSIS — J9621 Acute and chronic respiratory failure with hypoxia: Secondary | ICD-10-CM | POA: Diagnosis not present

## 2023-03-09 HISTORY — PX: IR FLUORO GUIDE CV LINE RIGHT: IMG2283

## 2023-03-09 HISTORY — PX: IR PTA VENOUS EXCEPT DIALYSIS CIRCUIT: IMG6126

## 2023-03-09 HISTORY — PX: IR REMOVE CV FIBRIN SHEATH: IMG699

## 2023-03-09 LAB — RENAL FUNCTION PANEL
Albumin: 2.6 g/dL — ABNORMAL LOW (ref 3.5–5.0)
Albumin: 2.6 g/dL — ABNORMAL LOW (ref 3.5–5.0)
Anion gap: 23 — ABNORMAL HIGH (ref 5–15)
Anion gap: 25 — ABNORMAL HIGH (ref 5–15)
BUN: 154 mg/dL — ABNORMAL HIGH (ref 6–20)
BUN: 158 mg/dL — ABNORMAL HIGH (ref 6–20)
CO2: 18 mmol/L — ABNORMAL LOW (ref 22–32)
CO2: 16 mmol/L — ABNORMAL LOW (ref 22–32)
Calcium: 9.7 mg/dL (ref 8.9–10.3)
Calcium: 9.2 mg/dL (ref 8.9–10.3)
Chloride: 90 mmol/L — ABNORMAL LOW (ref 98–111)
Chloride: 89 mmol/L — ABNORMAL LOW (ref 98–111)
Creatinine, Ser: 8.7 mg/dL — ABNORMAL HIGH (ref 0.61–1.24)
Creatinine, Ser: 9.02 mg/dL — ABNORMAL HIGH (ref 0.61–1.24)
GFR, Estimated: 7 mL/min — ABNORMAL LOW (ref >60)
GFR, Estimated: 7 mL/min — ABNORMAL LOW (ref >60)
Glucose, Bld: 102 mg/dL — ABNORMAL HIGH (ref 70–99)
Glucose, Bld: 117 mg/dL — ABNORMAL HIGH (ref 70–99)
Phosphorus: 5.4 mg/dL — ABNORMAL HIGH (ref 2.5–4.6)
Phosphorus: 5.3 mg/dL — ABNORMAL HIGH (ref 2.5–4.6)
Potassium: 5.7 mmol/L — ABNORMAL HIGH (ref 3.5–5.1)
Potassium: 5 mmol/L (ref 3.5–5.1)
Sodium: 131 mmol/L — ABNORMAL LOW (ref 135–145)
Sodium: 130 mmol/L — ABNORMAL LOW (ref 135–145)

## 2023-03-09 LAB — GLUCOSE, CAPILLARY
Glucose-Capillary: 108 mg/dL — ABNORMAL HIGH (ref 70–99)
Glucose-Capillary: 189 mg/dL — ABNORMAL HIGH (ref 70–99)
Glucose-Capillary: 115 mg/dL — ABNORMAL HIGH (ref 70–99)
Glucose-Capillary: 116 mg/dL — ABNORMAL HIGH (ref 70–99)
Glucose-Capillary: 121 mg/dL — ABNORMAL HIGH (ref 70–99)

## 2023-03-09 LAB — BASIC METABOLIC PANEL
Anion gap: 20 — ABNORMAL HIGH (ref 5–15)
BUN: 156 mg/dL — ABNORMAL HIGH (ref 6–20)
CO2: 16 mmol/L — ABNORMAL LOW (ref 22–32)
Calcium: 9.4 mg/dL (ref 8.9–10.3)
Chloride: 92 mmol/L — ABNORMAL LOW (ref 98–111)
Creatinine, Ser: 8.61 mg/dL — ABNORMAL HIGH (ref 0.61–1.24)
GFR, Estimated: 7 mL/min — ABNORMAL LOW (ref >60)
Glucose, Bld: 140 mg/dL — ABNORMAL HIGH (ref 70–99)
Potassium: 6.8 mmol/L (ref 3.5–5.1)
Sodium: 128 mmol/L — ABNORMAL LOW (ref 135–145)

## 2023-03-09 LAB — CBC WITH DIFFERENTIAL/PLATELET
Abs Immature Granulocytes: 0.08 10*3/uL — ABNORMAL HIGH (ref 0.00–0.07)
Basophils Absolute: 0 10*3/uL (ref 0.0–0.1)
Basophils Relative: 0 %
Eosinophils Absolute: 0 10*3/uL (ref 0.0–0.5)
Eosinophils Relative: 0 %
HCT: 26 % — ABNORMAL LOW (ref 39.0–52.0)
Hemoglobin: 8.3 g/dL — ABNORMAL LOW (ref 13.0–17.0)
Immature Granulocytes: 1 %
Lymphocytes Relative: 2 %
Lymphs Abs: 0.3 10*3/uL — ABNORMAL LOW (ref 0.7–4.0)
MCH: 33.1 pg (ref 26.0–34.0)
MCHC: 31.9 g/dL (ref 30.0–36.0)
MCV: 103.6 fL — ABNORMAL HIGH (ref 80.0–100.0)
Monocytes Absolute: 0.2 10*3/uL (ref 0.1–1.0)
Monocytes Relative: 1 %
Neutro Abs: 12.5 10*3/uL — ABNORMAL HIGH (ref 1.7–7.7)
Neutrophils Relative %: 96 %
Platelets: 231 10*3/uL (ref 150–400)
RBC: 2.51 MIL/uL — ABNORMAL LOW (ref 4.22–5.81)
RDW: 18.8 % — ABNORMAL HIGH (ref 11.5–15.5)
WBC: 13 10*3/uL — ABNORMAL HIGH (ref 4.0–10.5)
nRBC: 0.3 % — ABNORMAL HIGH (ref 0.0–0.2)

## 2023-03-09 LAB — BLOOD GAS, VENOUS
Acid-base deficit: 11.4 mmol/L — ABNORMAL HIGH (ref 0.0–2.0)
Bicarbonate: 16.4 mmol/L — ABNORMAL LOW (ref 20.0–28.0)
O2 Saturation: 57.2 %
Patient temperature: 37
pCO2, Ven: 45 mmHg (ref 44–60)
pH, Ven: 7.17 — CL (ref 7.25–7.43)
pO2, Ven: 42 mmHg (ref 32–45)

## 2023-03-09 LAB — POTASSIUM: Potassium: 5.1 mmol/L (ref 3.5–5.1)

## 2023-03-09 MED ORDER — CALCIUM GLUCONATE-NACL 2-0.675 GM/100ML-% IV SOLN
2.0000 g | Freq: Once | INTRAVENOUS | Status: AC
  Administered 2023-03-09: 2000 mg via INTRAVENOUS
  Filled 2023-03-09: qty 100

## 2023-03-09 MED ORDER — ACETAMINOPHEN 325 MG PO TABS
650.0000 mg | ORAL_TABLET | Freq: Four times a day (QID) | ORAL | Status: DC | PRN
  Administered 2023-03-15 – 2023-03-23 (×4): 650 mg via ORAL
  Filled 2023-03-09 (×4): qty 2

## 2023-03-09 MED ORDER — FUROSEMIDE 10 MG/ML IJ SOLN
80.0000 mg | Freq: Once | INTRAMUSCULAR | Status: AC
  Administered 2023-03-09: 80 mg via INTRAVENOUS
  Filled 2023-03-09: qty 8

## 2023-03-09 MED ORDER — SODIUM CHLORIDE 0.9 % IV SOLN
1.0000 g | INTRAVENOUS | Status: DC
  Administered 2023-03-09: 1 g via INTRAVENOUS
  Filled 2023-03-09: qty 10

## 2023-03-09 MED ORDER — SODIUM ZIRCONIUM CYCLOSILICATE 10 G PO PACK
10.0000 g | PACK | Freq: Once | ORAL | Status: AC
  Administered 2023-03-09: 10 g via ORAL
  Filled 2023-03-09: qty 1

## 2023-03-09 MED ORDER — SODIUM ZIRCONIUM CYCLOSILICATE 10 G PO PACK: 10.0000 g | PACK | Freq: Once | ORAL | Status: DC

## 2023-03-09 MED ORDER — SODIUM CHLORIDE 0.9 % IV SOLN
1.0000 g | Freq: Once | INTRAVENOUS | Status: AC
  Administered 2023-03-09: 1 g via INTRAVENOUS
  Filled 2023-03-09: qty 10

## 2023-03-09 MED ORDER — CHLORHEXIDINE GLUCONATE 4 % EX SOLN
CUTANEOUS | Status: AC
  Filled 2023-03-09: qty 15

## 2023-03-09 MED ORDER — VANCOMYCIN HCL 10 G IV SOLR
2500.0000 mg | Freq: Once | INTRAVENOUS | Status: DC
  Filled 2023-03-09: qty 25

## 2023-03-09 MED ORDER — ALTEPLASE 2 MG IJ SOLR
2.0000 mg | Freq: Once | INTRAMUSCULAR | Status: DC
  Filled 2023-03-09: qty 2

## 2023-03-09 MED ORDER — MIDAZOLAM HCL 2 MG/2ML IJ SOLN
INTRAMUSCULAR | Status: AC
  Filled 2023-03-09: qty 2

## 2023-03-09 MED ORDER — ACETAMINOPHEN 160 MG/5ML PO SOLN
650.0000 mg | Freq: Four times a day (QID) | ORAL | Status: DC | PRN
  Filled 2023-03-09: qty 20.3

## 2023-03-09 MED ORDER — VANCOMYCIN HCL IN DEXTROSE 1-5 GM/200ML-% IV SOLN: 1000.0000 mg | INTRAVENOUS | Status: DC

## 2023-03-09 MED ORDER — LIDOCAINE-EPINEPHRINE 1 %-1:100000 IJ SOLN
INTRAMUSCULAR | Status: AC
  Filled 2023-03-09: qty 1

## 2023-03-09 MED ORDER — FENTANYL CITRATE (PF) 100 MCG/2ML IJ SOLN
INTRAMUSCULAR | Status: AC | PRN
  Administered 2023-03-09 (×4): 25 ug via INTRAVENOUS

## 2023-03-09 MED ORDER — SODIUM CHLORIDE 0.9 % IV SOLN
1.0000 g | INTRAVENOUS | Status: DC
  Administered 2023-03-10 – 2023-03-13 (×4): 1 g via INTRAVENOUS
  Filled 2023-03-09 (×6): qty 10

## 2023-03-09 MED ORDER — SODIUM BICARBONATE 8.4 % IV SOLN
50.0000 meq | Freq: Once | INTRAVENOUS | Status: AC
  Administered 2023-03-09: 50 meq via INTRAVENOUS
  Filled 2023-03-09: qty 50

## 2023-03-09 MED ORDER — DEXTROSE 50 % IV SOLN
1.0000 | Freq: Once | INTRAVENOUS | Status: AC
  Administered 2023-03-09: 50 mL via INTRAVENOUS
  Filled 2023-03-09: qty 50

## 2023-03-09 MED ORDER — CEFAZOLIN SODIUM-DEXTROSE 2-4 GM/100ML-% IV SOLN
INTRAVENOUS | Status: AC | PRN
  Administered 2023-03-09: 2 g via INTRAVENOUS

## 2023-03-09 MED ORDER — ANTICOAGULANT SODIUM CITRATE 4% (200MG/5ML) IV SOLN
10.0000 mL | Freq: Once | Status: AC
  Administered 2023-03-09: 10 mL
  Filled 2023-03-09 (×2): qty 10

## 2023-03-09 MED ORDER — IOHEXOL 300 MG/ML  SOLN
50.0000 mL | Freq: Once | INTRAMUSCULAR | Status: AC | PRN
  Administered 2023-03-09: 20 mL via INTRAVENOUS

## 2023-03-09 MED ORDER — FENTANYL CITRATE (PF) 100 MCG/2ML IJ SOLN
INTRAMUSCULAR | Status: AC
  Filled 2023-03-09: qty 2

## 2023-03-09 MED ORDER — INSULIN ASPART 100 UNIT/ML IV SOLN
5.0000 [IU] | Freq: Once | INTRAVENOUS | Status: AC
  Administered 2023-03-09: 5 [IU] via INTRAVENOUS

## 2023-03-09 MED ORDER — CEFAZOLIN SODIUM-DEXTROSE 2-4 GM/100ML-% IV SOLN
INTRAVENOUS | Status: AC
  Filled 2023-03-09: qty 100

## 2023-03-09 MED ORDER — ALBUTEROL SULFATE (2.5 MG/3ML) 0.083% IN NEBU
10.0000 mg | INHALATION_SOLUTION | Freq: Once | RESPIRATORY_TRACT | Status: AC
  Administered 2023-03-09: 10 mg via RESPIRATORY_TRACT
  Filled 2023-03-09: qty 12

## 2023-03-09 MED ORDER — CHLORHEXIDINE GLUCONATE CLOTH 2 % EX PADS: 6.0000 | MEDICATED_PAD | Freq: Every day | CUTANEOUS | Status: DC

## 2023-03-09 MED ORDER — SODIUM ZIRCONIUM CYCLOSILICATE 5 G PO PACK
5.0000 g | PACK | Freq: Once | ORAL | Status: DC
  Filled 2023-03-09: qty 1

## 2023-03-09 NOTE — Consult Note (Signed)
WOC Nurse Consult Note: Reason for Consult: Asked to consult again for topical recommendations for wound care to buttocks and posterior thighs.  Moisture associated skin damage.  It was reported that patient unable to tolerate dialysis due to pain. Patient reports that he feels the wounds are getting better with the current topical care (aquacel and foam dressing every three days) He states that the duration of dialysis makes him very uncomfortable and the staff aren't able to reposition him as needed.  His mother is on the phone and asks for a support surface to be placed beneath him.  I educate both patient and parent that the wounds are caused by moisture primarily.  Implementing an additional support surface would increase heat, block the air flow of the mattress and likely make the situation worse.  Stools continue to be loose and patient refuses fecal management catheter.  Skin is likely too moist and patient refusing rectal pouch at this time.   Wound type: moisture associated skin damage, ongoing Pressure Injury POA: NA Measurement: 15 cm x 20 cm x 0.2 cm (scattered nonintact areas)  Wound bed: Ruddy red with white, macerated edges Drainage (amount, consistency, odor) moderate serosanguinous  no odor Periwound: intact Dressing procedure/placement/frequency: Continue aquacel as ordered.  Patient is on a low air loss mattress to alternate pressure and ensure adequate air flow.  A bariatric low air loss bed may provide more room for patient and increased comfort.   There are no further interventions I know of.  The patient needs the skin to remain clean and dry and in contact with the low air loss mattress for improvement.  The topical therapy in place is absorbent, antimicrobial and only changed every 2-3 days and PRN.  He is refusing interventions to divert stool away from this fragile skin.  It is difficult (and not always safe) to turn and reposition patients when they are receiving hemodialysis.   Will inquire about getting a larger low air loss bed.  Will not follow at this time.  Please re-consult if needed.  Mike Gip MSN, RN, FNP-BC CWON Wound, Ostomy, Continence Nurse Outpatient [facility] [phone] Pager [phone]

## 2023-03-09 NOTE — Progress Notes (Signed)
Patient Name: Phillip Young Date of Encounter: 03/09/2023 [facility] Cardiologist: Marca Ancona, MD   Interval Summary  .    Feeling OK.  Denies pain.  Hopeful he can get HD today.  Vital Signs .    Vitals:   03/09/23 0930 03/09/23 0931 03/09/23 0935 03/09/23 0940  BP: 102/83  105/80 114/79  Pulse: 88  92 92  Resp: (!) 21  (!) 25 (!) 28  Temp:      TempSrc:      SpO2: 99% 95% 100% 98%  Weight:      Height:        Intake/Output Summary (Last 24 hours) at 03/09/2023 1230 Last data filed at 03/09/2023 0700 Gross per 24 hour  Intake 780.03 ml  Output 900 ml  Net -119.97 ml      03/05/2023    5:53 PM 03/05/2023    1:46 PM 03/05/2023    4:11 AM  Last 3 Weights  Weight (lbs) 391 lb 8.6 oz 393 lb 8.3 oz 392 lb 6.7 oz  Weight (kg) 177.6 kg 178.5 kg 178 kg      Telemetry/ECG    Atrial fibrillation converted to sinus rhythm/sinus tachycardia - Personally Reviewed  Physical Exam .    VS:  BP 114/79   Pulse 92   Temp 97.8 F (36.6 C) (Oral)   Resp (!) 28   Ht 6' (1.829 m)   Wt (!) 177.6 kg   SpO2 98%   BMI 53.10 kg/m  , BMI Body mass index is 53.1 kg/m. GENERAL:  Chronically lll-appearing HEENT: Pupils equal round and reactive, fundi not visualized, oral mucosa unremarkable NECK:  No jugular venous distention, waveform within normal limits, carotid upstroke brisk and symmetric, no bruits, no thyromegaly LUNGS:  Clear to auscultation bilaterally on anterior exam HEART:  RRR.  PMI not displaced or sustained,S1 and S2 within normal limits, no S3, no S4, no clicks, no rubs, no murmurs ABD:  Flat, positive bowel sounds normal in frequency in pitch, no bruits, no rebound, no guarding, no midline pulsatile mass, no hepatomegaly, no splenomegaly EXT:  2 plus pulses throughout, no edema, no cyanosis no clubbing SKIN:  No rashes no nodules NEURO:  Cranial nerves II through XII grossly intact, motor grossly intact throughout PSYCH:  Cognitively intact, oriented to  person place and time   Assessment & Plan .     Phillip Young is a 88M with ESRD on HD, HFrEF, PAF OSA/OHS, morbid obesity, chronic hypoxic respiratory failure admitted with atrial fibrillation with RVR.   # Afib with RVR: Patient converted to sinus rhythm.  Rates were very uncontrolled in afib despite digoxin.  Patient notes some palpitations but not overly symptomatic.  Rates her rate remain uncontrolled on digoxin.  Digoxin level today was 0.6 despite missing HD.  Agree with HF service that this is not ideal.  However, there are no other options, even after consultation with EP.  Afib with rates in the 150s is also likely to worsen his heart failure and symptoms.  He is not a candidate for AV node ablation and pacemaker (including leadless) due to infection risk. Continue palliative care discussions.   # Chronic systolic and diastolic heart failure: Unable to be on GDMT due to hypotension.  He chronically requires midodrine.  Not a candidate for advanced heart failure therapies.  Not a candidate for ICD.  This has been documented by the advanced heart failure team. Volume management with HD.  Continue to stress the  importance of completing his full session.  # Fever/leukocytosis:  Empiric treatment for hospital acquired pneumonia.  We will follow from afar.  Please call if there are questions.     For questions or updates, please contact [facility] Please consult [email or web address] for contact info under        Signed, Chilton Si, MD

## 2023-03-09 NOTE — Progress Notes (Signed)
   03/09/23 1716  Vitals  Temp 97.6 F (36.4 C)  Pulse Rate 88  Resp 15  BP (!) 149/99  SpO2 96 %  Post Treatment  Dialyzer Clearance Clear  Hemodialysis Intake (mL) 0 mL  Liters Processed 71.5  Fluid Removed (mL) 2000 mL  Tolerated HD Treatment Yes  During Treatment Monitoring  Duration of HD Treatment -hour(s) 3.5 hour(s)   Received patient in bed to unit.  Alert and oriented.  Informed consent signed and in chart.   TX duration:3.5hrs  Patient tolerated well.  Transported back to the room  Alert, without acute distress.  Hand-off given to patient's nurse.   Access used: [facility] Access issues: none  Total UF removed: 2L Medication(s) given: none    Phillip Young  [facility]

## 2023-03-09 NOTE — Progress Notes (Signed)
Subjective:   Summary: Phillip Young is a 37 y.o. year old male currently admitted on the IMTS HD#47 for Acute on chronic hypoxic respiratory failure, ESRD on HD.  Overnight Events: Night team called as patient was tachycardic, hypotensive, somnolent, febrile, hyperkalemic.  -CXR, blood cultures drawn. No urine could be gathered with Lasix -Lokelma, insulin/D50 given multiple times with response to K: 5.7 -Dialysis catheter could not be flushed with TPN, PCCM consulted for nontunneled catheter placement today  Subjective:  Saw patient at bedside this morning. Patient was alert and oriented, feeling well. He had been refusing his medications unless he could take them with juice. Discussed why he needed to remain NPO and could not take drinks high in potassium. After some discussion, patient agree to plan. Had no questions about catheter placement or urgent need for dialysis today.   Objective:  Vital signs in last 24 hours: Vitals:   03/09/23 0500 03/09/23 0630 03/09/23 0700 03/09/23 0815  BP: 103/61 (!) 84/48 (!) 99/55 (!) 99/55  Pulse: 86 86 78 92  Resp:    20  Temp:    97.8 F (36.6 C)  TempSrc:    Oral  SpO2: 96% 96% 93% 99%  Weight:      Height:       Supplemental O2: HFNC SpO2: 99 % O2 Flow Rate (L/min): 6 L/min FiO2 (%): 30 %   Physical Exam:  Constitutional: chronically ill appearing, laying in bed, in no acute distress. Tremulous Cardiovascular: RRR, no murmurs, rubs or gallops Pulmonary/Chest: normal work of breathing on room air, lungs clear to auscultation bilaterally Abdominal: soft, non-tender, non-distended Skin: warm and dry Extremities: upper/lower extremity pulses 2+, no lower extremity edema present  Filed Weights   03/05/23 0411 03/05/23 1346 03/05/23 1753  Weight: (!) 178 kg (!) 178.5 kg (!) 177.6 kg     Intake/Output Summary (Last 24 hours) at 03/09/2023 0900 Last data filed at 03/09/2023 0700 Gross per 24 hour  Intake 780.03  ml  Output 600 ml  Net 180.03 ml   Net IO Since Admission: 11,923.89 mL [03/09/23 0900]  Pertinent Labs:    Latest Ref Rng & Units 03/09/2023    6:00 AM 03/09/2023    2:16 AM 03/08/2023    8:08 PM  CBC  WBC 4.0 - 10.5 K/uL 13.0  18.7  9.7   Hemoglobin 13.0 - 17.0 g/dL 8.3  8.3  8.6   Hematocrit 39.0 - 52.0 % 26.0  26.4  26.6   Platelets 150 - 400 K/uL 231  254  245        Latest Ref Rng & Units 03/09/2023    5:46 AM 03/09/2023    2:16 AM 03/08/2023    8:08 PM  CMP  Glucose 70 - 99 mg/dL 147  829  93   BUN 6 - 20 mg/dL 562  130  865   Creatinine 0.61 - 1.24 mg/dL 7.84  6.96  2.95   Sodium 135 - 145 mmol/L 131  128  131   Potassium 3.5 - 5.1 mmol/L 5.7  6.8  6.1   Chloride 98 - 111 mmol/L 90  92  91   CO2 22 - 32 mmol/L 18  16  18    Calcium 8.9 - 10.3 mg/dL 9.7  9.4  9.7     Imaging: DG Chest Port 1 View  Result Date: 03/08/2023 CLINICAL DATA:  Respiratory distress EXAM: PORTABLE  CHEST 1 VIEW COMPARISON:  02/28/2023 FINDINGS: Moderate interstitial edema with suspected layering bilateral pleural effusions, new. Cardiomegaly. Right IJ dual lumen dialysis catheter terminates cavoatrial junction. Left venous catheter terminates at the cavoatrial junction. IMPRESSION: Cardiomegaly with mild interstitial edema and layering bilateral pleural effusions, new. Electronically Signed   By: Charline Bills M.D.   On: 03/08/2023 21:31     Assessment/Plan:   Principal Problem:   Acute hypoxic respiratory failure ([name]) Active Problems:   Morbid obesity ([name])   Chronic systolic CHF (congestive heart failure) ([name])   Cardiogenic shock ([name])   ESRD (end stage renal disease) ([name])   ESRD (end stage renal disease) on dialysis ([name])   ESRD on dialysis ([name])   Hypervolemia   Sepsis with acute organ dysfunction without septic shock ([name])   Atrial fibrillation with RVR ([name])   Pressure injury of skin   Patient Summary: Phillip Young is a 37 y.o. with a pertinent PMH of chronic hypoxic  respiratory failure, ESRD, HFrEF with EF 20-25%, refractory Afib, amiodarone induced lung toxicity, who presented with fevers, joint pains, SOB and admitted for sepsis, now resolved.   #Acute on chronic hypoxemic/hypercapnic respiratory failure #Acute metabolic encephalopathy in setting of hypercapnia Patient was transferred from ICU on 8/9 after extubation and pressors were discontinued. Mental status has improved, patient is currently alert and following commands.  -Currently on 5L HFNC with O2 sats in mid 90s -Noncompliant with CPAP at home, nurses note patient has been refusing BiPAP at night -Encourage incentive spirometry and PT/OT  #Acute on chronic HFrEF with pulmonary edema #Paroxysmal Afib/flutter with RVR #Nonischemic cardiomyopathy EF less than 20% -Remains in Afib with RVR, HR ranging up to 150s -Patient intolerant of metoprolol and carvedilol due to allergy, amiodarone due to lung toxicity, diltiazem due to extremely reduced EF -Cardiology consulting, recommending digoxin every other day (non-dialysis days) and frequent digoxin levels. Appreciate recs -Digoxin level today 0.6 -Continue Eliquis for stroke prophylaxis  #ESRD on HD T/Th/S #Recurrent metabolic acidosis, improving HD could not be performed 8/13 due to clotted TDC. IR consulted for replacement, though may not be able to place tunneled catheter in setting of hemodynamic instability and hyperkalemia -Nephrology following, appreciate assistance -PCCM consulted for non-tunneled catheter placement today with HD this afternoon. Appreciate assistance -Patient has had trouble tolerating due to skin breakdown and TDC clotting. Haldol as needed for agitation -Wound care consulted for ongoing skin breakdown making patient intolerant of dialysis -Midodrine 30 TID for hypotension  #Hospital Acquired Pneumonia Patient developed new leukocytosis to 18.7, CXR showing pulmonary edema with new pleural effusions. Patient was febrile  to 101 overnight. No new O2 requirement -Vancomycin and ceftriaxone ordered -Blood cultures pending  #Mixed Septic and Cardiogenic Shock -Resolved, transferred to floor from ICU on 8/9  #Hypervolemic hyponatremia #Hyperphosphatemia #Hyperkalemia #Hypocalcemia -Recurrent hyperkalemia. Treating as needed with lokelma, insulin/D50, albuterol, calcium gluconate -Monitor electrolytes with daily labs, supplement as necessary -Tube feeds d/c 8/12. Monitor blood glucose with frequent checks  #Anemia of chronic disease Monitor with daily labs, transfuse as needed  #Skin breakdown Perineal and sacral skin breakdown. Patient refuses rectal tube -Continue local wound care -Wound care reconsulted  Diet: Normal IVF: None,   VTE: DOAC Code: Full PT/OT recs:   Dispo: Anticipated discharge to Skilled nursing facility pending medical stability Monna Fam, MD PGY-1 Internal Medicine Resident Pager Number [phone] Please contact the on call pager after 5 pm and on weekends at [phone].

## 2023-03-09 NOTE — Progress Notes (Signed)
[facility] Progress Note   Subjective:    Noted patient was tachycardic, hypotensive, and febrile overnight. Blood cultures from 8/13 (+) Enterobacter cloacae, now on IV Cefepime. S/p [facility] exchange with balloon fibrin sheath disruption today in IR. Patient now on HD. Tolerating UFG 2L. SBP is 110s and HR in 90s. K+ now is 5.  Objective Vitals:   03/09/23 1500 03/09/23 1530 03/09/23 1600 03/09/23 1630  BP: 105/73 (!) 117/102 99/75 99/66  Pulse: 86 96 94 94  Resp: (!) 22 (!) 22 19 19   Temp:      TempSrc:      SpO2: 92% 92% 96% 98%  Weight:      Height:       Physical Exam General: chronically ill looking male, obese, 6L O2 [place], calm in bed today Heart: S1 and S2; Currently regular; No murmurs Lungs: Clear anteriorly/laterally Abdomen: soft, +bs Extremities: No LE edema Dialysis Access: RIJ TDC intact  Filed Weights   03/05/23 1346 03/05/23 1753 03/09/23 1316  Weight: (!) 178.5 kg (!) 177.6 kg (!) 183.3 kg    Intake/Output Summary (Last 24 hours) at 03/09/2023 1654 Last data filed at 03/09/2023 1230 Gross per 24 hour  Intake 880.03 ml  Output --  Net 880.03 ml    Additional Objective Labs: Basic Metabolic Panel: Recent Labs  Lab 03/04/23 0419 03/05/23 0505 03/09/23 0216 03/09/23 0546 03/09/23 1201 03/09/23 1322  NA 133*   < > 128* 131*  --  130*  K 3.9   < > 6.8* 5.7* 5.1 5.0  CL 94*   < > 92* 90*  --  89*  CO2 22   < > 16* 18*  --  16*  GLUCOSE 97   < > 140* 102*  --  117*  BUN 75*   < > 156* 154*  --  158*  CREATININE 3.60*   < > 8.61* 8.70*  --  9.02*  CALCIUM 10.7*   < > 9.4 9.7  --  9.2  PHOS 3.6  --   --  5.4*  --  5.3*   < > = values in this interval not displayed.   Liver Function Tests: Recent Labs  Lab 03/05/23 0505 03/06/23 0339 03/08/23 0430 03/09/23 0546 03/09/23 1322  AST 30 29 55*  --   --   ALT 26 26 49*  --   --   ALKPHOS 252* 274* 358*  --   --   BILITOT 0.8 0.7 0.8  --   --   PROT 7.2 7.1 7.1  --   --   ALBUMIN  2.8* 2.8* 2.8* 2.6* 2.6*   No results for input(s): "LIPASE", "AMYLASE" in the last 168 hours. CBC: Recent Labs  Lab 03/07/23 0621 03/08/23 0430 03/08/23 2008 03/09/23 0216 03/09/23 0600  WBC 8.3 5.9 9.7 18.7* 13.0*  NEUTROABS  --   --  9.4*  --  12.5*  HGB 8.6* 8.8* 8.6* 8.3* 8.3*  HCT 27.1* 28.0* 26.6* 26.4* 26.0*  MCV 106.7* 102.9* 104.3* 104.3* 103.6*  PLT 302 288 245 254 231   Blood Culture    Component Value Date/Time   SDES BLOOD RIGHT HAND 03/08/2023 2145   SPECREQUEST  03/08/2023 2145    BOTTLES DRAWN AEROBIC AND ANAEROBIC Blood Culture results may not be optimal due to an inadequate volume of blood received in culture bottles   CULT GRAM NEGATIVE RODS 03/08/2023 2145   REPTSTATUS PENDING 03/08/2023 2145    Cardiac Enzymes: No results for input(s): "  CKTOTAL", "CKMB", "CKMBINDEX", "TROPONINI" in the last 168 hours. CBG: Recent Labs  Lab 03/08/23 2003 03/08/23 2043 03/09/23 0005 03/09/23 0415 03/09/23 1230  GLUCAP 84 82 108* 189* 115*   Iron Studies: No results for input(s): "IRON", "TIBC", "TRANSFERRIN", "FERRITIN" in the last 72 hours. Lab Results  Component Value Date   INR 2.0 (H) 06/10/2022   INR 2.8 (H) 06/09/2022   INR 2.9 (H) 06/07/2022   Studies/Results: IR Fluoro Guide CV Line Right  Result Date: 03/09/2023 INDICATION: Malfunctioning hemodialysis catheter, known fibrin sheath EXAM: 1. Exchange of tunneled hemodialysis catheter using fluoroscopic guidance 2. Catheterization and angiography of the SVC 3. Angioplasty of the SVC MEDICATIONS: Documented in the EMR ANESTHESIA/SEDATION: Local analgesia FLUOROSCOPY TIME:  Fluoroscopy Time: 2.3 minutes (173 mGy) COMPLICATIONS: None immediate. PROCEDURE: Informed written consent was obtained from the patient after a thorough discussion of the procedural risks, benefits and alternatives. All questions were addressed. Maximal Sterile Barrier Technique was utilized including caps, mask, sterile gowns, sterile  gloves, sterile drape, hand hygiene and skin antiseptic. A timeout was performed prior to the initiation of the procedure. Patient was placed supine on the exam table. The right neck and chest was prepped and draped in the standard sterile fashion with inclusion of the existing hemodialysis catheter within the sterile field. Injection of the hemodialysis catheter was performed through both ports, demonstrating the suspected fibrin sheath along the distal aspect of the catheter. Decision was made to proceed with angioplasty of the SVC and disruption of the fibrin sheath. Over a stiff Glidewire, the SVC was catheterized and the existing hemodialysis catheter was removed, and a 12 French sheath advanced into the proximal SVC. Angiography of the SVC was performed, demonstrating a narrow channel compatible with fibrin sheath. A 12 mm balloon was advanced through the sheath, and multi station angioplasty of the SVC was performed to disrupt the fibrin sheath. Repeat injection through the access sheath demonstrated resolution of the fibrin sheath with free flow of contrast material into the SVC and right atrium. The access sheath was then removed, a new cuffed dual-lumen hemodialysis catheter was advanced with distal tip positioned in the right atrium. The catheter was found to aspirate and flush appropriately. It was locked with the appropriate volume of sodium citrate. It was sutured to the skin using silk suture, and a sterile dressing was placed. The patient tolerated the procedure well without immediate complication. IMPRESSION: 1. Successful angioplasty of the SVC and disruption of the fibrin sheath surrounding the hemodialysis catheter. 2. Successful exchange of tunneled hemodialysis catheter for a new, similar cuffed dual-lumen hemodialysis catheter with tip in the right atrium. The hemodialysis catheter is ready for immediate use. Electronically Signed   By: Olive Bass M.D.   On: 03/09/2023 14:36   IR Remove  CV Fibrin Sheath  Result Date: 03/09/2023 INDICATION: Malfunctioning hemodialysis catheter, known fibrin sheath EXAM: 1. Exchange of tunneled hemodialysis catheter using fluoroscopic guidance 2. Catheterization and angiography of the SVC 3. Angioplasty of the SVC MEDICATIONS: Documented in the EMR ANESTHESIA/SEDATION: Local analgesia FLUOROSCOPY TIME:  Fluoroscopy Time: 2.3 minutes (173 mGy) COMPLICATIONS: None immediate. PROCEDURE: Informed written consent was obtained from the patient after a thorough discussion of the procedural risks, benefits and alternatives. All questions were addressed. Maximal Sterile Barrier Technique was utilized including caps, mask, sterile gowns, sterile gloves, sterile drape, hand hygiene and skin antiseptic. A timeout was performed prior to the initiation of the procedure. Patient was placed supine on the exam table. The right neck and  chest was prepped and draped in the standard sterile fashion with inclusion of the existing hemodialysis catheter within the sterile field. Injection of the hemodialysis catheter was performed through both ports, demonstrating the suspected fibrin sheath along the distal aspect of the catheter. Decision was made to proceed with angioplasty of the SVC and disruption of the fibrin sheath. Over a stiff Glidewire, the SVC was catheterized and the existing hemodialysis catheter was removed, and a 12 French sheath advanced into the proximal SVC. Angiography of the SVC was performed, demonstrating a narrow channel compatible with fibrin sheath. A 12 mm balloon was advanced through the sheath, and multi station angioplasty of the SVC was performed to disrupt the fibrin sheath. Repeat injection through the access sheath demonstrated resolution of the fibrin sheath with free flow of contrast material into the SVC and right atrium. The access sheath was then removed, a new cuffed dual-lumen hemodialysis catheter was advanced with distal tip positioned in the  right atrium. The catheter was found to aspirate and flush appropriately. It was locked with the appropriate volume of sodium citrate. It was sutured to the skin using silk suture, and a sterile dressing was placed. The patient tolerated the procedure well without immediate complication. IMPRESSION: 1. Successful angioplasty of the SVC and disruption of the fibrin sheath surrounding the hemodialysis catheter. 2. Successful exchange of tunneled hemodialysis catheter for a new, similar cuffed dual-lumen hemodialysis catheter with tip in the right atrium. The hemodialysis catheter is ready for immediate use. Electronically Signed   By: Olive Bass M.D.   On: 03/09/2023 14:36   DG Chest Port 1 View  Result Date: 03/08/2023 CLINICAL DATA:  Respiratory distress EXAM: PORTABLE CHEST 1 VIEW COMPARISON:  02/28/2023 FINDINGS: Moderate interstitial edema with suspected layering bilateral pleural effusions, new. Cardiomegaly. Right IJ dual lumen dialysis catheter terminates cavoatrial junction. Left venous catheter terminates at the cavoatrial junction. IMPRESSION: Cardiomegaly with mild interstitial edema and layering bilateral pleural effusions, new. Electronically Signed   By: Charline Bills M.D.   On: 03/08/2023 21:31    Medications:  sodium chloride 10 mL/hr at 02/21/23 1444   anticoagulant sodium citrate     anticoagulant sodium citrate     ceFEPime (MAXIPIME) IV     [START ON 03/10/2023] ceFEPime (MAXIPIME) IV      apixaban  5 mg Oral BID   Chlorhexidine Gluconate Cloth  6 each Topical Q0600   cholecalciferol  1,000 Units Per Tube Daily   darbepoetin (ARANESP) injection - DIALYSIS  200 mcg Subcutaneous Q Sat-1800   digoxin  0.1875 mg Oral Once per day on Monday Wednesday Friday   doxercalciferol  2 mcg Intravenous Q T,Th,Sa-HD   famotidine  20 mg Oral Daily   lidocaine  1 patch Transdermal Q24H   melatonin  5 mg Oral QHS   midodrine  30 mg Oral TID WC   multivitamin  1 tablet Oral QHS   mouth  rinse  15 mL Mouth Rinse 4 times per day   senna-docusate  1 tablet Oral QHS    Dialysis Orders: [facility] TTS  4h   180kg  425/1.5  TDC  Na citrate cath lock (no heparin) - last OP HD was 6/25, post wt 193.8kg - consistently left HD 8-12kg up at least the prior 3 wks - sensipar 60mg  three times per week - hectorol 4 mcg IV three times per week - mircera 200 mcg  IV q 2 wks, last 6/13, due 6/27  Assessment/Plan: # Acute/ chronic hypotension -  came off pressors 8/03, cont midodrine 30 tid.    # Pneumococcal bacteremia/Enterobacter cloacae bacteremia: admitting diagnosis w/ septic shock. Complex course. Blood cultures from 8/13 (+) Enterobacter cloacae. Now on IV Cefepime.    # Acute hypoxic and hypercapnic respiratory failure: extubated on 7/20.  Stable on lower dose of O2 at 3-6 lpm HFNC to [place].    # Volume - mild LE edema, wt's down 178 . Need to keep his volume under control, e.g. ideal wt prob around 173- 176kg    # ESRD - usual HD is TTS. Required emergent HD on 6/29 because of fluid overload.  Started CRRT on 6/29 due to shock.  CRRT dc'd 8/07 as shock had resolved. Back on iHD TTS. Next HD 8/13.  Needs to be positioned in bed properly right before going to dialysis. Missed HD 8/13 2nd TDC malfunction (see below). Now on HD.   # Agitation - while on dialysis, related to decubitus ulcers and associated pain. Before dialysis make sure patient is positioned properly in the bed. Discussed with primary care team, I ordered Haldol 1mg  IV X 1 to help with current agitation. Appears this was also given over the weekend for this issues.   # HD access - 8/2 required temp HD catheter placement after [facility] malfunctioned. New TDC from 8/6 found to have fibrin sheath. S/p TDC exchange with fibrin sheath disruption in IR today. Now on HD-machine running well so far.   # Prognosis - with uncontrolled afib, pressure sores, and other comorbidities prognosis is poor.   # Anemia of CKD:  on max ESA with  variable Hbs, generally in the 9s.    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 Have resumed IV hectorol (half dose) since Ca++ levels are back to normal.    # Acute on chronic systolic CHF, nonischemic: EF 57-84%. Per Cardiology, not a candidate for AV node ablation, ICD, or pacemake d/t infection risk. Also not a candidate for advanced heart failure therapies.   #A-fib with RVR with hypotension: S/P cardioversion 7/5 -> NSR -> afib RVR 7/6 early AM refractory to [facility] x3 + amiodarone. Persistent afib/flutter w/ HR > 100  Salome Holmes, NP [employer] 03/09/2023,4:54 PM  LOS: 47 days

## 2023-03-09 NOTE — Progress Notes (Signed)
Report of K 6.8 by nurse. [name] earlier K was 6.1 and he was given lokelma 10 g and insulin 5 units/D50. Stat EKG was ordered, along with calcium gluconate, sodium bicarb, lokelma 10 mg, insulin 5 units/D50. Albuterol was ordered to shift potassium as well given pt had maintained NSR with HR in 80-90s. Given continued increase despite initial therapies; nephrologist, Dr. Malen Gauze, made aware.   Gwenevere Abbot, MD Eligha Bridegroom. [facility] Internal Medicine Residency, PGY-3

## 2023-03-09 NOTE — Progress Notes (Signed)
Patient unavailable for AM rounds & 14:00 rounds.

## 2023-03-09 NOTE — Plan of Care (Signed)
  Problem: Education: Goal: Knowledge of General Education information will improve Description: Including pain rating scale, medication(s)/side effects and non-pharmacologic comfort measures Outcome: Not Progressing   

## 2023-03-09 NOTE — Plan of Care (Signed)
Nephrology Cross Coverage note:   Rec'd call re: patient with hyperkalemia   Reviewed chart.  Nonoliguric.  In addition to therapies already ordered by primary team, ordered lasix 80 mg IV once.   On chart review noted leukocytosis and now febrile.    Off of BIPAP now - and breathing better   I have paged pulmonary critical care to request a nontunneled catheter for HD.  I am concerned that IR will have reservations about a tunneled catheter placement and he will ultimately end up with a nontunneled catheter and do want to make sure that access is not further delayed.  If critical care is to place access then he would need to move to the ICU.  Discussed this with the primary team  Critical care is not currently able to place a line for [place] due to patient volumes.   Patient's catheter was apparently TPA'd.  I have spoken with HD RN on call and asked that they again attempt HD through current catheter.  HD RN is finishing up a treatment in the ICU - 1 hour left there.  Then can put patient on.  Patient would still benefit from catheter exchange with IR.    HD today - 1K bath for 2 hours then transition to 2K bath.  I amp bicarb now  Estanislado Emms, MD 4:06 AM 03/09/2023

## 2023-03-09 NOTE — Progress Notes (Signed)
PT Cancellation Note  Patient Details Name: [facility] MRN: [id number] DOB: June 14, 1986   Cancelled Treatment:    Reason Eval/Treat Not Completed: Patient at procedure or test/unavailable. Pt in IR with plans for HD after.    Ilda Foil 03/09/2023, 8:47 AM

## 2023-03-09 NOTE — Progress Notes (Signed)
Pharmacy Antibiotic Note  Phillip Young is a 37 y.o. male admitted on 01/21/2023 with pneumonia.  Pharmacy has been consulted for vancomycin dosing for this hemodialysis TThSa patient. Ideal Body Weight = 78 kilograms Adjusted Body Weight = 118 kilograms  Plan: Using Antimicrobial Dosing Booklet, for Vancomycin Dosing in Hemodialysis Patients: Volume of distribution = 0.9 L/kg; target peak = 25 mcg/mL Loading dose (milligrams) = Adjusted Body Weight 118 kilograms x 0.9 L/kg x 25 mg/L + 2,655 ~ 2,500 milligrams x 1 dose.  Height: 6' (182.9 cm) Weight: (!) 177.6 kg (391 lb 8.6 oz) IBW/kg (Calculated) : 77.6  Temp (24hrs), Avg:99.1 F (37.3 C), Min:97.6 F (36.4 C), Max:101.1 F (38.4 C)  Recent Labs  Lab 03/07/23 0621 03/08/23 0430 03/08/23 2008 03/09/23 0216 03/09/23 0546 03/09/23 0600  WBC 8.3 5.9 9.7 18.7*  --  13.0*  CREATININE 6.82* 7.97* 8.35* 8.61* 8.70*  --     Estimated Creatinine Clearance: 19.1 mL/min (A) (by C-G formula based on SCr of 8.7 mg/dL (H)).    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 Given that studies have shown that negative MRSA nasal surveillance results have a high negative predictive value (NPV) of approximately 99% for both community-acquired and hospital-acquired pneumonia, if the MRSA PCR IS NEGATIVE,  will discuss with the team the possible "role-back"/discontinuation of vancomycin.  Thank you for allowing pharmacy to be a part of this patient's care.  Elicia Lamp, PharmD, CPP 03/09/2023 7:48 AM

## 2023-03-09 NOTE — Progress Notes (Signed)
Pharmacy Antibiotic Note  Phillip Young is a 37 y.o. male admitted on 01/21/2023 and now found to have Enterobacter cloacae bacteremia Pharmacy has been consulted for Cefepime dosing.  The patient is ESRD - usual schedule noted to be TTS. The patient is currently receiving HD and tolerating. Expect he might get off schedule if considering line holiday so will utilize daily dosing for now.   Plan: - Start Cefepime 1g IV every 24 hours - Will continue to follow HD schedule/duration, culture results, LOT, and antibiotic de-escalation plans   Height: 6' (182.9 cm) Weight: (!) 183.3 kg (404 lb 1.7 oz) IBW/kg (Calculated) : 77.6  Temp (24hrs), Avg:98.7 F (37.1 C), Min:97.6 F (36.4 C), Max:101.1 F (38.4 C)  Recent Labs  Lab 03/07/23 0621 03/08/23 0430 03/08/23 2008 03/09/23 0216 03/09/23 0546 03/09/23 0600 03/09/23 1322  WBC 8.3 5.9 9.7 18.7*  --  13.0*  --   CREATININE 6.82* 7.97* 8.35* 8.61* 8.70*  --  9.02*    Estimated Creatinine Clearance: 18.8 mL/min (A) (by C-G formula based on SCr of 9.02 mg/dL (H)).    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  Georgina Pillion, PharmD, BCPS, BCIDP Infectious Diseases Clinical Pharmacist 03/09/2023 3:13 PM   **Pharmacist phone directory can now be found on [email or web address] ([email or web address]).  Listed under [facility].

## 2023-03-09 NOTE — Progress Notes (Signed)
L TL PICC 's gray port completely occluded, unable to instill TPA. RN made aware.

## 2023-03-09 NOTE — Progress Notes (Signed)
PHARMACY - PHYSICIAN COMMUNICATION CRITICAL VALUE ALERT - BLOOD CULTURE IDENTIFICATION (BCID)  Phillip Young is an 37 y.o. male who presented to [facility] on 01/21/2023 with a chief complaint of chest pain, shortness of breath.  Assessment: Blood culture 1/4 positive for enterobacter cloacae, no resistance detected  Name of physician (or Provider) Contacted: Johny Sax, MD  Current antibiotics: ceftriaxone  Changes to prescribed antibiotics recommended:  Recommend transitioning from ceftriaxone to cefepime  Results for orders placed or performed during the hospital encounter of 01/21/23  Blood Culture ID Panel (Reflexed) (Collected: 03/08/2023  9:45 PM)  Result Value Ref Range   Enterococcus faecalis NOT DETECTED NOT DETECTED   Enterococcus Faecium NOT DETECTED NOT DETECTED   Listeria monocytogenes NOT DETECTED NOT DETECTED   Staphylococcus species NOT DETECTED NOT DETECTED   Staphylococcus aureus (BCID) NOT DETECTED NOT DETECTED   Staphylococcus epidermidis NOT DETECTED NOT DETECTED   Staphylococcus lugdunensis NOT DETECTED NOT DETECTED   Streptococcus species NOT DETECTED NOT DETECTED   Streptococcus agalactiae NOT DETECTED NOT DETECTED   Streptococcus pneumoniae NOT DETECTED NOT DETECTED   Streptococcus pyogenes NOT DETECTED NOT DETECTED   A.calcoaceticus-baumannii NOT DETECTED NOT DETECTED   Bacteroides fragilis NOT DETECTED NOT DETECTED   Enterobacterales DETECTED (A) NOT DETECTED   Enterobacter cloacae complex DETECTED (A) NOT DETECTED   Escherichia coli NOT DETECTED NOT DETECTED   Klebsiella aerogenes NOT DETECTED NOT DETECTED   Klebsiella oxytoca NOT DETECTED NOT DETECTED   Klebsiella pneumoniae NOT DETECTED NOT DETECTED   Proteus species NOT DETECTED NOT DETECTED   Salmonella species NOT DETECTED NOT DETECTED   Serratia marcescens NOT DETECTED NOT DETECTED   Haemophilus influenzae NOT DETECTED NOT DETECTED   Neisseria meningitidis NOT DETECTED NOT DETECTED    Pseudomonas aeruginosa NOT DETECTED NOT DETECTED   Stenotrophomonas maltophilia NOT DETECTED NOT DETECTED   Candida albicans NOT DETECTED NOT DETECTED   Candida auris NOT DETECTED NOT DETECTED   Candida glabrata NOT DETECTED NOT DETECTED   Candida krusei NOT DETECTED NOT DETECTED   Candida parapsilosis NOT DETECTED NOT DETECTED   Candida tropicalis NOT DETECTED NOT DETECTED   Cryptococcus neoformans/gattii NOT DETECTED NOT DETECTED   CTX-M ESBL NOT DETECTED NOT DETECTED   Carbapenem resistance IMP NOT DETECTED NOT DETECTED   Carbapenem resistance KPC NOT DETECTED NOT DETECTED   Carbapenem resistance NDM NOT DETECTED NOT DETECTED   Carbapenem resist OXA 48 LIKE NOT DETECTED NOT DETECTED   Carbapenem resistance VIM NOT DETECTED NOT DETECTED    Romie Minus, PharmD PGY1 Pharmacy Resident  Please check AMION for all [facility] Pharmacy phone numbers After 10:00 PM, call Main Pharmacy [phone]

## 2023-03-09 NOTE — Plan of Care (Signed)
  Problem: Education: Goal: Knowledge of General Education information will improve Description: Including pain rating scale, medication(s)/side effects and non-pharmacologic comfort measures Outcome: Progressing   Problem: Health Behavior/Discharge Planning: Goal: Ability to manage health-related needs will improve Outcome: Progressing   Problem: Clinical Measurements: Goal: Ability to maintain clinical measurements within normal limits will improve Outcome: Progressing Goal: Will remain free from infection Outcome: Progressing Goal: Diagnostic test results will improve Outcome: Progressing Goal: Respiratory complications will improve Outcome: Progressing Goal: Cardiovascular complication will be avoided Outcome: Progressing   Problem: Activity: Goal: Risk for activity intolerance will decrease Outcome: Progressing   Problem: Nutrition: Goal: Adequate nutrition will be maintained Outcome: Progressing   Problem: Coping: Goal: Level of anxiety will decrease Outcome: Progressing   Problem: Elimination: Goal: Will not experience complications related to bowel motility Outcome: Progressing   Problem: Pain Managment: Goal: General experience of comfort will improve Outcome: Progressing   Problem: Skin Integrity: Goal: Risk for impaired skin integrity will decrease Outcome: Progressing   Problem: Education: Goal: Ability to describe self-care measures that may prevent or decrease complications (Diabetes Survival Skills Education) will improve Outcome: Progressing Goal: Individualized Educational Video(s) Outcome: Progressing   Problem: Coping: Goal: Ability to adjust to condition or change in health will improve Outcome: Progressing   Problem: Fluid Volume: Goal: Ability to maintain a balanced intake and output will improve Outcome: Progressing   Problem: Health Behavior/Discharge Planning: Goal: Ability to identify and utilize available resources and services  will improve Outcome: Progressing Goal: Ability to manage health-related needs will improve Outcome: Progressing   Problem: Metabolic: Goal: Ability to maintain appropriate glucose levels will improve Outcome: Progressing   Problem: Nutritional: Goal: Maintenance of adequate nutrition will improve Outcome: Progressing Goal: Progress toward achieving an optimal weight will improve Outcome: Progressing   Problem: Skin Integrity: Goal: Risk for impaired skin integrity will decrease Outcome: Progressing   Problem: Tissue Perfusion: Goal: Adequacy of tissue perfusion will improve Outcome: Progressing

## 2023-03-09 NOTE — Progress Notes (Signed)
SLP Cancellation Note  Patient Details Name: Phillip Young MRN: [id number] DOB: 07-09-1986   Cancelled treatment:       Reason Eval/Treat Not Completed: Patient at procedure or test/unavailable   Angela Nevin, MA, CCC-SLP Speech Therapy

## 2023-03-10 DIAGNOSIS — J9601 Acute respiratory failure with hypoxia: Secondary | ICD-10-CM | POA: Diagnosis not present

## 2023-03-10 DIAGNOSIS — T80219A Unspecified infection due to central venous catheter, initial encounter: Secondary | ICD-10-CM | POA: Insufficient documentation

## 2023-03-10 DIAGNOSIS — T80219D Unspecified infection due to central venous catheter, subsequent encounter: Secondary | ICD-10-CM

## 2023-03-10 DIAGNOSIS — I480 Paroxysmal atrial fibrillation: Secondary | ICD-10-CM | POA: Diagnosis not present

## 2023-03-10 DIAGNOSIS — Z992 Dependence on renal dialysis: Secondary | ICD-10-CM | POA: Diagnosis not present

## 2023-03-10 DIAGNOSIS — J9621 Acute and chronic respiratory failure with hypoxia: Secondary | ICD-10-CM | POA: Diagnosis not present

## 2023-03-10 DIAGNOSIS — J9622 Acute and chronic respiratory failure with hypercapnia: Secondary | ICD-10-CM | POA: Diagnosis not present

## 2023-03-10 DIAGNOSIS — R570 Cardiogenic shock: Secondary | ICD-10-CM | POA: Diagnosis not present

## 2023-03-10 DIAGNOSIS — N186 End stage renal disease: Secondary | ICD-10-CM | POA: Diagnosis not present

## 2023-03-10 DIAGNOSIS — I5023 Acute on chronic systolic (congestive) heart failure: Secondary | ICD-10-CM | POA: Diagnosis not present

## 2023-03-10 LAB — CBC
HCT: 24.9 % — ABNORMAL LOW (ref 39.0–52.0)
Hemoglobin: 8 g/dL — ABNORMAL LOW (ref 13.0–17.0)
MCH: 33.9 pg (ref 26.0–34.0)
MCHC: 32.1 g/dL (ref 30.0–36.0)
MCV: 105.5 fL — ABNORMAL HIGH (ref 80.0–100.0)
Platelets: 155 10*3/uL (ref 150–400)
RBC: 2.36 MIL/uL — ABNORMAL LOW (ref 4.22–5.81)
RDW: 19.2 % — ABNORMAL HIGH (ref 11.5–15.5)
WBC: 12 10*3/uL — ABNORMAL HIGH (ref 4.0–10.5)
nRBC: 0 % (ref 0.0–0.2)

## 2023-03-10 LAB — COMPREHENSIVE METABOLIC PANEL
ALT: 30 U/L (ref 0–44)
AST: 60 U/L — ABNORMAL HIGH (ref 15–41)
Albumin: 2.5 g/dL — ABNORMAL LOW (ref 3.5–5.0)
Alkaline Phosphatase: 393 U/L — ABNORMAL HIGH (ref 38–126)
Anion gap: 20 — ABNORMAL HIGH (ref 5–15)
BUN: 86 mg/dL — ABNORMAL HIGH (ref 6–20)
CO2: 21 mmol/L — ABNORMAL LOW (ref 22–32)
Calcium: 9 mg/dL (ref 8.9–10.3)
Chloride: 92 mmol/L — ABNORMAL LOW (ref 98–111)
Creatinine, Ser: 5.78 mg/dL — ABNORMAL HIGH (ref 0.61–1.24)
GFR, Estimated: 12 mL/min — ABNORMAL LOW (ref >60)
Glucose, Bld: 94 mg/dL (ref 70–99)
Potassium: 3.8 mmol/L (ref 3.5–5.1)
Sodium: 133 mmol/L — ABNORMAL LOW (ref 135–145)
Total Bilirubin: 1 mg/dL (ref 0.3–1.2)
Total Protein: 6.6 g/dL (ref 6.5–8.1)

## 2023-03-10 LAB — BASIC METABOLIC PANEL
Anion gap: 18 — ABNORMAL HIGH (ref 5–15)
BUN: 91 mg/dL — ABNORMAL HIGH (ref 6–20)
CO2: 25 mmol/L (ref 22–32)
Calcium: 9.2 mg/dL (ref 8.9–10.3)
Chloride: 89 mmol/L — ABNORMAL LOW (ref 98–111)
Creatinine, Ser: 6.2 mg/dL — ABNORMAL HIGH (ref 0.61–1.24)
GFR, Estimated: 11 mL/min — ABNORMAL LOW (ref >60)
Glucose, Bld: 104 mg/dL — ABNORMAL HIGH (ref 70–99)
Potassium: 4 mmol/L (ref 3.5–5.1)
Sodium: 132 mmol/L — ABNORMAL LOW (ref 135–145)

## 2023-03-10 LAB — GLUCOSE, CAPILLARY
Glucose-Capillary: 100 mg/dL — ABNORMAL HIGH (ref 70–99)
Glucose-Capillary: 103 mg/dL — ABNORMAL HIGH (ref 70–99)
Glucose-Capillary: 133 mg/dL — ABNORMAL HIGH (ref 70–99)
Glucose-Capillary: 86 mg/dL (ref 70–99)
Glucose-Capillary: 92 mg/dL (ref 70–99)
Glucose-Capillary: 94 mg/dL (ref 70–99)
Glucose-Capillary: 94 mg/dL (ref 70–99)

## 2023-03-10 LAB — COOXEMETRY PANEL
Carboxyhemoglobin: 2 % — ABNORMAL HIGH (ref 0.5–1.5)
Methemoglobin: 2.1 % — ABNORMAL HIGH (ref 0.0–1.5)
O2 Saturation: 65.8 %
Total hemoglobin: 8.6 g/dL — ABNORMAL LOW (ref 12.0–16.0)

## 2023-03-10 LAB — MAGNESIUM: Magnesium: 2.6 mg/dL — ABNORMAL HIGH (ref 1.7–2.4)

## 2023-03-10 MED ORDER — DIGOXIN 125 MCG PO TABS
0.1250 mg | ORAL_TABLET | ORAL | Status: DC
  Administered 2023-03-11 – 2023-03-23 (×6): 0.125 mg via ORAL
  Filled 2023-03-10 (×10): qty 1

## 2023-03-10 NOTE — Progress Notes (Signed)
Patient Name: Phillip Young Date of Encounter: 03/10/2023 [facility] Cardiologist: Phillip Ancona, MD   Interval Summary  .    Feeling OK.  No CP/SOB.   Vital Signs .    Vitals:   03/10/23 0005 03/10/23 0400 03/10/23 0819 03/10/23 0908  BP: 114/78 111/68    Pulse: (!) 111 89 92 96  Resp:   20 19  Temp: 97.8 F (36.6 C) 98.5 F (36.9 C) 97.6 F (36.4 C)   TempSrc: Oral Oral Oral   SpO2: 100% 97% 100% 98%  Weight:      Height:        Intake/Output Summary (Last 24 hours) at 03/10/2023 0959 Last data filed at 03/09/2023 1716 Gross per 24 hour  Intake 100 ml  Output 2000 ml  Net -1900 ml      03/09/2023    1:16 PM 03/05/2023    5:53 PM 03/05/2023    1:46 PM  Last 3 Weights  Weight (lbs) 404 lb 1.7 oz 391 lb 8.6 oz 393 lb 8.3 oz  Weight (kg) 183.3 kg 177.6 kg 178.5 kg      Telemetry/ECG    Sinus rhythm.  Sinus tachycardia.   Personally Reviewed  Physical Exam .    VS:  BP 111/68 (BP Location: Right Arm)   Pulse 96   Temp 97.6 F (36.4 C) (Oral)   Resp 19   Ht 6' (1.829 m)   Wt (!) 183.3 kg   SpO2 98%   BMI 54.81 kg/m  , BMI Body mass index is 54.81 kg/m. GENERAL:  Chronically lll-appearing HEENT: Pupils equal round and reactive, fundi not visualized, oral mucosa unremarkable NECK:  No jugular venous distention, waveform within normal limits, carotid upstroke brisk and symmetric, no bruits, no thyromegaly LUNGS:  Clear to auscultation bilaterally on anterior exam HEART:  RRR.  PMI not displaced or sustained,S1 and S2 within normal limits, no S3, no S4, no clicks, no rubs, no murmurs ABD:  Flat, positive bowel sounds normal in frequency in pitch, no bruits, no rebound, no guarding, no midline pulsatile mass, no hepatomegaly, no splenomegaly EXT:  2 plus pulses throughout, no edema, no cyanosis no clubbing SKIN:  No rashes no nodules NEURO:  Cranial nerves II through XII grossly intact, motor grossly intact throughout PSYCH:  Cognitively intact,  oriented to person place and time   Assessment & Plan .     [name] is a 36M with ESRD on HD, HFrEF, PAF OSA/OHS, morbid obesity, chronic hypoxic respiratory failure admitted with atrial fibrillation with RVR.   # Afib with RVR: Patient converted to sinus rhythm and is maintaining.  Rates were very uncontrolled in afib despite digoxin.  Patient notes some palpitations when in atrial fibrillation but not overly symptomatic.  Digoxin level was 0.6 despite missing HD.  Agree with HF service that this is not ideal.  However, there are no other options, even after consultation with EP.  Afib with rates in the 150s is also likely to worsen his heart failure and symptoms.  He is not a candidate for AV node ablation and pacemaker (including leadless) due to infection risk. Continue palliative care discussions.   # Chronic systolic and diastolic heart failure: BP and co-ox stable.  Unable to be on GDMT due to hypotension.  He chronically requires midodrine.  Not a candidate for advanced heart failure therapies.  Not a candidate for ICD.  This has been documented by the advanced heart failure team. Volume management with  HD.  Continue to stress the importance of completing his full session.  # Fever/leukocytosis:  Line infection.  Per ID/IM.  We will follow from afar.  Please call if there are questions.     For questions or updates, please contact  [facility] Please consult [email or web address] for contact info under        Signed, Phillip Si, MD

## 2023-03-10 NOTE — Progress Notes (Signed)
[facility] Progress Note   Subjective:    S/p TDC exchange 8/14 in IR. Tolerated yesterday's HD with net UF 2L. Seen and examined patient at bedside. He reports breathing has improved. Reviewed bedside monitor-appears he's in SR HR 80-90s. Next HD 8/16 and 8/17 to place him back on TTS schedule.  Objective Vitals:   03/10/23 0400 03/10/23 0819 03/10/23 0908 03/10/23 1130  BP: 111/68   (!) 99/47  Pulse: 89 92 96 80  Resp:  20 19 17   Temp: 98.5 F (36.9 C) 97.6 F (36.4 C)  (!) 97.5 F (36.4 C)  TempSrc: Oral Oral  Oral  SpO2: 97% 100% 98% 100%  Weight:      Height:       Physical Exam General: chronically ill looking male, obese, 6L O2 [place], calm in bed today Heart: S1 and S2; Currently regular; No murmurs Lungs: Clear anteriorly/laterally Abdomen: soft, +bs Extremities: No LE edema Dialysis Access: [facility] intact  Filed Weights   03/05/23 1346 03/05/23 1753 03/09/23 1316  Weight: (!) 178.5 kg (!) 177.6 kg (!) 183.3 kg    Intake/Output Summary (Last 24 hours) at 03/10/2023 1302 Last data filed at 03/09/2023 1716 Gross per 24 hour  Intake --  Output 2000 ml  Net -2000 ml    Additional Objective Labs: Basic Metabolic Panel: Recent Labs  Lab 03/04/23 0419 03/05/23 0505 03/09/23 0546 03/09/23 1201 03/09/23 1322 03/10/23 0609  NA 133*   < > 131*  --  130* 133*  K 3.9   < > 5.7* 5.1 5.0 3.8  CL 94*   < > 90*  --  89* 92*  CO2 22   < > 18*  --  16* 21*  GLUCOSE 97   < > 102*  --  117* 94  BUN 75*   < > 154*  --  158* 86*  CREATININE 3.60*   < > 8.70*  --  9.02* 5.78*  CALCIUM 10.7*   < > 9.7  --  9.2 9.0  PHOS 3.6  --  5.4*  --  5.3*  --    < > = values in this interval not displayed.   Liver Function Tests: Recent Labs  Lab 03/06/23 0339 03/08/23 0430 03/09/23 0546 03/09/23 1322 03/10/23 0609  AST 29 55*  --   --  60*  ALT 26 49*  --   --  30  ALKPHOS 274* 358*  --   --  393*  BILITOT 0.7 0.8  --   --  1.0  PROT 7.1 7.1  --   --  6.6   ALBUMIN 2.8* 2.8* 2.6* 2.6* 2.5*   No results for input(s): "LIPASE", "AMYLASE" in the last 168 hours. CBC: Recent Labs  Lab 03/08/23 0430 03/08/23 2008 03/09/23 0216 03/09/23 0600 03/10/23 0609  WBC 5.9 9.7 18.7* 13.0* 12.0*  NEUTROABS  --  9.4*  --  12.5*  --   HGB 8.8* 8.6* 8.3* 8.3* 8.0*  HCT 28.0* 26.6* 26.4* 26.0* 24.9*  MCV 102.9* 104.3* 104.3* 103.6* 105.5*  PLT 288 245 254 231 155   Blood Culture    Component Value Date/Time   SDES BLOOD RIGHT HAND 03/08/2023 2145   SPECREQUEST  03/08/2023 2145    BOTTLES DRAWN AEROBIC AND ANAEROBIC Blood Culture results may not be optimal due to an inadequate volume of blood received in culture bottles   CULT (A) 03/08/2023 2145    ENTEROBACTER CLOACAE SUSCEPTIBILITIES TO FOLLOW Performed at [facility]  Lab, [address], [place], [place] [address]    REPTSTATUS PENDING 03/08/2023 2145    Cardiac Enzymes: No results for input(s): "CKTOTAL", "CKMB", "CKMBINDEX", "TROPONINI" in the last 168 hours. CBG: Recent Labs  Lab 03/09/23 2155 03/10/23 0035 03/10/23 0416 03/10/23 0819 03/10/23 1134  GLUCAP 121* 133* 86 92 103*   Iron Studies: No results for input(s): "IRON", "TIBC", "TRANSFERRIN", "FERRITIN" in the last 72 hours. Lab Results  Component Value Date   INR 2.0 (H) 06/10/2022   INR 2.8 (H) 06/09/2022   INR 2.9 (H) 06/07/2022   Studies/Results: IR Fluoro Guide CV Line Right  Result Date: 03/09/2023 INDICATION: Malfunctioning hemodialysis catheter, known fibrin sheath EXAM: 1. Exchange of tunneled hemodialysis catheter using fluoroscopic guidance 2. Catheterization and angiography of the SVC 3. Angioplasty of the SVC MEDICATIONS: Documented in the EMR ANESTHESIA/SEDATION: Local analgesia FLUOROSCOPY TIME:  Fluoroscopy Time: 2.3 minutes (173 mGy) COMPLICATIONS: None immediate. PROCEDURE: Informed written consent was obtained from the patient after a thorough discussion of the procedural risks, benefits and  alternatives. All questions were addressed. Maximal Sterile Barrier Technique was utilized including caps, mask, sterile gowns, sterile gloves, sterile drape, hand hygiene and skin antiseptic. A timeout was performed prior to the initiation of the procedure. Patient was placed supine on the exam table. The right neck and chest was prepped and draped in the standard sterile fashion with inclusion of the existing hemodialysis catheter within the sterile field. Injection of the hemodialysis catheter was performed through both ports, demonstrating the suspected fibrin sheath along the distal aspect of the catheter. Decision was made to proceed with angioplasty of the SVC and disruption of the fibrin sheath. Over a stiff Glidewire, the SVC was catheterized and the existing hemodialysis catheter was removed, and a 12 French sheath advanced into the proximal SVC. Angiography of the SVC was performed, demonstrating a narrow channel compatible with fibrin sheath. A 12 mm balloon was advanced through the sheath, and multi station angioplasty of the SVC was performed to disrupt the fibrin sheath. Repeat injection through the access sheath demonstrated resolution of the fibrin sheath with free flow of contrast material into the SVC and right atrium. The access sheath was then removed, a new cuffed dual-lumen hemodialysis catheter was advanced with distal tip positioned in the right atrium. The catheter was found to aspirate and flush appropriately. It was locked with the appropriate volume of sodium citrate. It was sutured to the skin using silk suture, and a sterile dressing was placed. The patient tolerated the procedure well without immediate complication. IMPRESSION: 1. Successful angioplasty of the SVC and disruption of the fibrin sheath surrounding the hemodialysis catheter. 2. Successful exchange of tunneled hemodialysis catheter for a new, similar cuffed dual-lumen hemodialysis catheter with tip in the right atrium. The  hemodialysis catheter is ready for immediate use. Electronically Signed   By: Olive Bass M.D.   On: 03/09/2023 14:36   IR Remove CV Fibrin Sheath  Result Date: 03/09/2023 INDICATION: Malfunctioning hemodialysis catheter, known fibrin sheath EXAM: 1. Exchange of tunneled hemodialysis catheter using fluoroscopic guidance 2. Catheterization and angiography of the SVC 3. Angioplasty of the SVC MEDICATIONS: Documented in the EMR ANESTHESIA/SEDATION: Local analgesia FLUOROSCOPY TIME:  Fluoroscopy Time: 2.3 minutes (173 mGy) COMPLICATIONS: None immediate. PROCEDURE: Informed written consent was obtained from the patient after a thorough discussion of the procedural risks, benefits and alternatives. All questions were addressed. Maximal Sterile Barrier Technique was utilized including caps, mask, sterile gowns, sterile gloves, sterile drape, hand hygiene and skin  antiseptic. A timeout was performed prior to the initiation of the procedure. Patient was placed supine on the exam table. The right neck and chest was prepped and draped in the standard sterile fashion with inclusion of the existing hemodialysis catheter within the sterile field. Injection of the hemodialysis catheter was performed through both ports, demonstrating the suspected fibrin sheath along the distal aspect of the catheter. Decision was made to proceed with angioplasty of the SVC and disruption of the fibrin sheath. Over a stiff Glidewire, the SVC was catheterized and the existing hemodialysis catheter was removed, and a 12 French sheath advanced into the proximal SVC. Angiography of the SVC was performed, demonstrating a narrow channel compatible with fibrin sheath. A 12 mm balloon was advanced through the sheath, and multi station angioplasty of the SVC was performed to disrupt the fibrin sheath. Repeat injection through the access sheath demonstrated resolution of the fibrin sheath with free flow of contrast material into the SVC and right  atrium. The access sheath was then removed, a new cuffed dual-lumen hemodialysis catheter was advanced with distal tip positioned in the right atrium. The catheter was found to aspirate and flush appropriately. It was locked with the appropriate volume of sodium citrate. It was sutured to the skin using silk suture, and a sterile dressing was placed. The patient tolerated the procedure well without immediate complication. IMPRESSION: 1. Successful angioplasty of the SVC and disruption of the fibrin sheath surrounding the hemodialysis catheter. 2. Successful exchange of tunneled hemodialysis catheter for a new, similar cuffed dual-lumen hemodialysis catheter with tip in the right atrium. The hemodialysis catheter is ready for immediate use. Electronically Signed   By: Olive Bass M.D.   On: 03/09/2023 14:36   DG Chest Port 1 View  Result Date: 03/08/2023 CLINICAL DATA:  Respiratory distress EXAM: PORTABLE CHEST 1 VIEW COMPARISON:  02/28/2023 FINDINGS: Moderate interstitial edema with suspected layering bilateral pleural effusions, new. Cardiomegaly. Right IJ dual lumen dialysis catheter terminates cavoatrial junction. Left venous catheter terminates at the cavoatrial junction. IMPRESSION: Cardiomegaly with mild interstitial edema and layering bilateral pleural effusions, new. Electronically Signed   By: Charline Bills M.D.   On: 03/08/2023 21:31    Medications:  sodium chloride 10 mL/hr at 02/21/23 1444   anticoagulant sodium citrate     ceFEPime (MAXIPIME) IV      apixaban  5 mg Oral BID   Chlorhexidine Gluconate Cloth  6 each Topical Q0600   cholecalciferol  1,000 Units Per Tube Daily   darbepoetin (ARANESP) injection - DIALYSIS  200 mcg Subcutaneous Q Sat-1800   digoxin  0.1875 mg Oral Once per day on Monday Wednesday Friday   doxercalciferol  2 mcg Intravenous Q T,Th,Sa-HD   famotidine  20 mg Oral Daily   lidocaine  1 patch Transdermal Q24H   melatonin  5 mg Oral QHS   midodrine  30 mg  Oral TID WC   multivitamin  1 tablet Oral QHS   mouth rinse  15 mL Mouth Rinse 4 times per day   senna-docusate  1 tablet Oral QHS    Dialysis Orders: [facility] TTS  4h   180kg  425/1.5  TDC  Na citrate cath lock (no heparin) - last OP HD was 6/25, post wt 193.8kg - consistently left HD 8-12kg up at least the prior 3 wks - sensipar 60mg  three times per week - hectorol 4 mcg IV three times per week - mircera 200 mcg  IV q 2 wks, last 6/13, due  6/27  Assessment/Plan: # Acute/ chronic hypotension - came off pressors 8/03, cont midodrine 30 tid.    # Pneumococcal bacteremia/Enterobacter cloacae bacteremia: admitting diagnosis w/ septic shock. Complex course. Blood cultures from 8/13 (+) Enterobacter cloacae. Now on IV Cefepime. Line holiday suggested, however, given urgent dialysis needs, this is not appropriate for this time, will re-visit this once he is more clinically stable.   # Acute hypoxic and hypercapnic respiratory failure: extubated on 7/20.  Stable on lower dose of O2 at 3-6 lpm HFNC to [place].    # Volume - mild LE edema, wt's down 178 . Need to keep his volume under control, e.g. ideal wt prob around 173- 176kg    # ESRD - usual HD is TTS. Required emergent HD on 6/29 because of fluid overload.  Started CRRT on 6/29 due to shock.  CRRT dc'd 8/07 as shock had resolved. Back on iHD TTS. Needs to be positioned in bed properly right before going to dialysis. Missed HD 8/13 2nd TDC malfunction (see below). Received HD 8/14 (2L removed). Next HD 8/16 and 8/17 to place him back on TTS schedule.   # Agitation - while on dialysis, related to decubitus ulcers and associated pain. Before dialysis make sure patient is positioned properly in the bed. I had to give him IV Haldol earlier this week while on HD. Monitor his behavior on the HD unit.   # HD access - 8/2 required temp HD catheter placement after [facility] malfunctioned. New TDC from 8/6 found to have fibrin sheath. S/p TDC exchange with  fibrin sheath disruption in IR 8/14-TDC ran well with HD yesterday.   # Prognosis - with uncontrolled afib, pressure sores, and other comorbidities prognosis is poor.   # Anemia of CKD:  on max ESA with variable Hbs, generally in the 9s.    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 Have resumed IV hectorol (half dose) since Ca++ levels are back to normal.    # Acute on chronic systolic CHF, nonischemic: EF 40-10%. Per Cardiology, not a candidate for AV node ablation, ICD, or pacemake d/t infection risk. Also not a candidate for advanced heart failure therapies.   #A-fib with RVR with hypotension: S/P cardioversion 7/5 -> NSR -> afib RVR 7/6 early AM refractory to [facility] x3 + amiodarone. Persistent afib/flutter w/ HR > 100  Salome Holmes, NP [facility] 03/10/2023,1:02 PM  LOS: 48 days

## 2023-03-10 NOTE — Progress Notes (Signed)
Now that the pt is out of afib, ok to change digoxin back to 0.125mg  MWF to avoid risk of accumulation in ESRD per Dr. Duke Salvia.  Ulyses Southward, PharmD, BCIDP, AAHIVP, CPP Infectious Disease Pharmacist 03/10/2023 3:26 PM

## 2023-03-10 NOTE — Progress Notes (Signed)
Nutrition Follow-up  DOCUMENTATION CODES:   Morbid obesity  INTERVENTION:     Advance diet per SLP recommendations   Continue Magic cup TID with meals, each supplement provides 290 kcal and 9 grams of protein  Re-order Juven BID when clinically appropriate   Re-order Nepro BID when clinically appropriate   NUTRITION DIAGNOSIS:   Inadequate oral intake related to inability to eat as evidenced by NPO status. -ongoing  GOAL:   Patient will meet greater than or equal to 90% of their needs -patient NPO for procedure, not liking pureed diet, and SBFT has been removed   MONITOR:   PO intake, Diet advancement, TF tolerance, Weight trends, Labs, Skin  REASON FOR ASSESSMENT:   Ventilator, Consult Assessment of nutrition requirement/status  ASSESSMENT:   37 year old male with PMHx of ESRD on HD, nonischemic cardiomyopathy, refractory A-fib, possible amio lung toxicity admitted with acute on chronic hypoxmic respiratory failure in setting of virus and missed HD, probable sepsis.   Visited patient at bedside who reports he has not been eating well because he does not like his diet (DYS 1, nectar thick liquids). Patient is NPO for procedure in IR. SBFT has been removed. SLP has not followed up to reassess his swallow and hopefully advance his diet. Patient reports he wishes he could eat some baked chicken from home. Patient was very hoarse and difficult to understand.   RD reviewed charts and MD Notes:   -S/p TDC exchange with balloon fibrin sheath disruption 8/14 in IR  -Not a candidate for AV node ablation, ICD or pacemaker due to infection risk  -Not a candidate for advanced heart failure therapies    Labs: Na 133, BUN 86, Cr 5.78, alk phos 393 Meds: maxipime, pepcid, rena-vit, senokot, vitamin D3  Wt ranges from 360s to ~400#   PO: no meals documented since 8/5   I/O's:  -495 ml since admission    Diet Order:   Diet Order             Diet NPO time specified Except  for: Sips with Meds  Diet effective midnight                   EDUCATION NEEDS:   No education needs have been identified at this time  Skin:  Skin Assessment: Skin Integrity Issues: Skin Integrity Issues:: Stage II, Other (Comment) Stage II: coccyx Other: MASD to buttocks with fissure in gluteal fold; non-blanchable dark area on L hip, L flank, back (not presenting like DTIPI, thought possibly related to poor skin/tissue perfusion and pressor use  Last BM:  8/14 type 6  Height:   Ht Readings from Last 1 Encounters:  01/21/23 6' (1.829 m)    Weight:   Wt Readings from Last 1 Encounters:  03/09/23 (!) 183.3 kg    Ideal Body Weight:  80.9 kg  BMI:  Body mass index is 54.81 kg/m.  Estimated Nutritional Needs:   Kcal:  2100-2300 kcals  Protein:  160-190 g  Fluid:  UOP + 1 L   Leodis Rains, RDN, LDN  Clinical Nutrition

## 2023-03-10 NOTE — Progress Notes (Signed)
I had a long conversation with patient at bedside today regarding goals of care and his code status. Patient continues to not have a full understanding of how sick he is and his overall prognosis, despite many attempts to educate over the last several days. [name] is open to having a meeting with the palliative team and wishes for his mother [name] to be present.   I spoke to [place] on the phone who said she would be available tomorrow for a meeting. She also does not seem to have a full understanding of how sick her son is. She repeatedly expressed displeasure at the treatment team for directly causing bedsores in her son, and wants a list of other hospitals her son can be transferred to.   To the palliative team, please reach out to [facility] to have her involved whenever palliative meeting takes place. We greatly appreciate your assistance.

## 2023-03-10 NOTE — Plan of Care (Signed)
  Problem: Clinical Measurements: Goal: Ability to maintain clinical measurements within normal limits will improve Outcome: Progressing   

## 2023-03-10 NOTE — Progress Notes (Signed)
Subjective:   Summary: Phillip Young is a 37 y.o. year old male currently admitted on the IMTS HD#48 for Acute on chronic hypoxic respiratory failure, ESRD on HD.  Subjective:  Saw patient at bedside this morning. Patient was alert and oriented, feeling well. Informed pt about new bloodstream infection and new antibiotics. Had a long conversation with patient about his code status and his very poor possibility for positive long term health outcomes. Agreed to a discussion with palliative team. Patient requested his mother be involved.   Objective:  Vital signs in last 24 hours: Vitals:   03/10/23 0005 03/10/23 0400 03/10/23 0819 03/10/23 0908  BP: 114/78 111/68    Pulse: (!) 111 89 92 96  Resp:   20 19  Temp: 97.8 F (36.6 C) 98.5 F (36.9 C) 97.6 F (36.4 C)   TempSrc: Oral Oral Oral   SpO2: 100% 97% 100% 98%  Weight:      Height:       Supplemental O2: HFNC SpO2: 98 % O2 Flow Rate (L/min): 7 L/min FiO2 (%): 30 %   Physical Exam:  Constitutional: chronically ill appearing, laying in bed, in no acute distress. Tremulous Cardiovascular: RRR, no murmurs, rubs or gallops Pulmonary/Chest: normal work of breathing on room air, lungs clear to auscultation bilaterally Abdominal: soft, non-tender, non-distended Skin: warm and dry Extremities: upper/lower extremity pulses 2+, no lower extremity edema present  Filed Weights   03/05/23 1346 03/05/23 1753 03/09/23 1316  Weight: (!) 178.5 kg (!) 177.6 kg (!) 183.3 kg     Intake/Output Summary (Last 24 hours) at 03/10/2023 1049 Last data filed at 03/09/2023 1716 Gross per 24 hour  Intake 100 ml  Output 2000 ml  Net -1900 ml   Net IO Since Admission: 10,023.89 mL [03/10/23 1049]  Pertinent Labs:    Latest Ref Rng & Units 03/10/2023    6:09 AM 03/09/2023    6:00 AM 03/09/2023    2:16 AM  CBC  WBC 4.0 - 10.5 K/uL 12.0  13.0  18.7   Hemoglobin 13.0 - 17.0 g/dL 8.0  8.3  8.3   Hematocrit 39.0 - 52.0 % 24.9   26.0  26.4   Platelets 150 - 400 K/uL 155  231  254        Latest Ref Rng & Units 03/10/2023    6:09 AM 03/09/2023    1:22 PM 03/09/2023   12:01 PM  CMP  Glucose 70 - 99 mg/dL 94  098    BUN 6 - 20 mg/dL 86  119    Creatinine 1.47 - 1.24 mg/dL 8.29  5.62    Sodium 130 - 145 mmol/L 133  130    Potassium 3.5 - 5.1 mmol/L 3.8  5.0  5.1   Chloride 98 - 111 mmol/L 92  89    CO2 22 - 32 mmol/L 21  16    Calcium 8.9 - 10.3 mg/dL 9.0  9.2    Total Protein 6.5 - 8.1 g/dL 6.6     Total Bilirubin 0.3 - 1.2 mg/dL 1.0     Alkaline Phos 38 - 126 U/L 393     AST 15 - 41 U/L 60     ALT 0 - 44 U/L 30       Imaging: No results found.   Assessment/Plan:   Principal Problem:   Acute hypoxic respiratory failure ([name]) Active Problems:   Morbid  obesity ([name])   Chronic systolic CHF (congestive heart failure) ([name])   Cardiogenic shock ([name])   ESRD (end stage renal disease) ([name])   ESRD (end stage renal disease) on dialysis ([name])   ESRD on dialysis ([name])   Hypervolemia   Sepsis with acute organ dysfunction without septic shock ([name])   Atrial fibrillation ([name])   Pressure injury of skin   Central line infection   Patient Summary: Phillip Young is a 37 y.o. with a pertinent PMH of chronic hypoxic respiratory failure, ESRD, HFrEF with EF 20-25%, refractory Afib, amiodarone induced lung toxicity, who presented with fevers, joint pains, SOB and admitted for sepsis, now resolved.   #Acute on chronic hypoxemic/hypercapnic respiratory failure #Acute metabolic encephalopathy in setting of hypercapnia Patient was transferred from ICU on 8/9 after extubation and pressors were discontinued. Mental status has improved, patient is currently alert and following commands.  -Currently on 5L HFNC with O2 sats in mid 90s -Noncompliant with CPAP at home, nurses note patient has been refusing BiPAP at night -Encourage incentive spirometry and PT/OT  #Acute on chronic HFrEF with pulmonary edema #Paroxysmal  Afib/flutter with RVR #Nonischemic cardiomyopathy EF less than 20% -Remains in Afib with RVR, HR ranging up to 150s -Patient intolerant of metoprolol and carvedilol due to allergy, amiodarone due to lung toxicity, diltiazem due to extremely reduced EF -Cardiology consulting, recommending digoxin every other day (non-dialysis days) and frequent digoxin levels. Appreciate recs -Digoxin level today 0.6 -Continue Eliquis for stroke prophylaxis -Palliative team consulted, planning family meeting soon  #ESRD on HD T/Th/S #Recurrent metabolic acidosis, improving HD could not be performed 8/13 due to clotted TDC. IR consulted for replacement, though may not be able to place tunneled catheter in setting of hemodynamic instability and hyperkalemia -Nephrology following, appreciate assistance -IR replaced [facility] 8/14, appreciate assistance. Able to receive HD yesterday -Patient has had trouble tolerating due to skin breakdown and TDC clotting. Haldol as needed for agitation -Wound care reconsulted for ongoing skin breakdown making patient intolerant of dialysis. No further recs -Midodrine 30 TID for hypotension  #Hospital Acquired Pneumonia #Bloodstream infection Patient developed new leukocytosis to 18.7, CXR showing pulmonary edema with new pleural effusions. Blood cultures on 8/15 positive for enterobacter cloacae.  -Antibiotics transitioned to cefepime -Line holiday suggested, however given urgent dialysis needs this is not appropriate at this time  #Hypervolemic hyponatremia #Hyperphosphatemia #Hyperkalemia #Hypocalcemia -Recurrent hyperkalemia. Treating as needed with lokelma, insulin/D50, albuterol, calcium gluconate -Monitor electrolytes with daily labs, supplement as necessary -Tube feeds d/c 8/12. Monitor blood glucose with frequent checks  #Anemia of chronic disease Monitor with daily labs, transfuse as needed  #Skin breakdown Perineal and sacral skin breakdown. Patient refuses  rectal tube -Continue local wound care -Wound care reconsulted, no new recs  #Mixed Septic and Cardiogenic Shock -Resolved, transferred to floor from ICU on 8/9  Diet: Normal IVF: None,   VTE: DOAC Code: Full PT/OT recs:   Dispo: Anticipated discharge to Skilled nursing facility pending medical stability Monna Fam, MD PGY-1 Internal Medicine Resident Pager Number [phone] Please contact the on call pager after 5 pm and on weekends at [phone].

## 2023-03-10 NOTE — Progress Notes (Signed)
Physical Therapy Treatment Patient Details Name: Phillip Young MRN: [id number] DOB: [birth year] Today's Date: 03/10/2023   History of Present Illness Pt is a 37 y/o M presenting to ED on 6/28 with fever, joint pain, and SOB after missing HD. Admitted for acute hypoxic respiratory failure. Intubated and started on CRRT, vasopressor support on 6/30. Extubated 7/20. Underwent DCCV 7/5, 7/6, and 7/7 without success. Pt off CRRT on 8/7.  PMH includes ESRD, NICM, refractory A fib, HFrEF    PT Comments  Pt received in supine and eager for mobility. Pt reporting slightly improved BLE pain this session, however continues to report sensitivity. Pt continuing to require up to max A +2 to sit EOB due to limited RUE ROM and weakness. Pt able to tolerate sitting EOB for ~10 mins and performing therex. Pt demonstrates increased difficulty returning to supine requiring increased time due to sacral wounds. Pt motivated to progress WB on tilt bed and is able to tolerate 10 min progressing angle to 58 degrees. Pt noted to have RLE hyperextension, so a pillow was placed behind R knee for protection. Pt able to perform 2 sets x10 reps of mini squats while standing in tilt bed. Pt continues to benefit from PT services to progress toward functional mobility goals.     If plan is discharge home, recommend the following: Two people to help with walking and/or transfers;Two people to help with bathing/dressing/bathroom;Direct supervision/assist for medications management;Direct supervision/assist for financial management;Assist for transportation;Help with stairs or ramp for entrance   Can travel by private vehicle     No  Equipment Recommendations   (TBD at next venue)    Recommendations for Other Services       Precautions / Restrictions Precautions Precautions: Fall Precaution Comments: wounds on hip/back/buttocks Restrictions Weight Bearing Restrictions: No     Mobility  Bed Mobility Overal bed mobility:  Needs Assistance Bed Mobility: Supine to Sit, Sit to Supine     Supine to sit: Max assist, +2 for physical assistance Sit to supine: Max assist, +2 for physical assistance   General bed mobility comments: Pt able to advance BLE towards EOB and hold bedrail, however requires max A +2 for trunk elevation and scooting towards EOB with bedpad. Start Time: 1055 Angle: 58 degrees Total Minutes in Angle: 10 minutes (gradually working up to 58 degrees)    Tilt Bed Tilt Bed Start Time: 1055 Angle: 58 degrees Total Minutes in Angle: 10 minutes (gradually working up to 58 degrees)     Balance Overall balance assessment: Needs assistance Sitting-balance support: Feet unsupported, Bilateral upper extremity supported Sitting balance-Leahy Scale: Fair Sitting balance - Comments: Sitting EOB requiring UE support       Standing balance comment: Pt able to stand in tilt bed for 10 min and perform mini squats and cross body reaching x5                            Cognition Arousal: Alert Behavior During Therapy: WFL for tasks assessed/performed Overall Cognitive Status: Within Functional Limits for tasks assessed                                          Exercises General Exercises - Lower Extremity Quad Sets: AROM, Standing, Both, 5 reps (in tilt bed) Long Arc Quad: AROM, Both, Seated, 10 reps Mini-Sqauts: AROM, Standing, 10 reps (  x2 in tilt bed)    General Comments        Pertinent Vitals/Pain Pain Assessment Pain Assessment: Faces Faces Pain Scale: Hurts even more Pain Location: buttocks, B feet/ankles Pain Descriptors / Indicators: Discomfort, Aching, Grimacing Pain Intervention(s): Monitored during session, Repositioned     PT Goals (current goals can now be found in the care plan section) Acute Rehab PT Goals Patient Stated Goal: Not stated PT Goal Formulation: With patient Time For Goal Achievement: 03/07/23 Potential to Achieve Goals:  Fair Progress towards PT goals: Progressing toward goals    Frequency           PT Plan      AM-PAC PT "6 Clicks" Mobility   Outcome Measure  Help needed turning from your back to your side while in a flat bed without using bedrails?: Total Help needed moving from lying on your back to sitting on the side of a flat bed without using bedrails?: Total Help needed moving to and from a bed to a chair (including a wheelchair)?: Total Help needed standing up from a chair using your arms (e.g., wheelchair or bedside chair)?: Total Help needed to walk in hospital room?: Total Help needed climbing 3-5 steps with a railing? : Total 6 Click Score: 6    End of Session Equipment Utilized During Treatment: Oxygen Activity Tolerance: Patient tolerated treatment well Patient left: in bed;with call bell/phone within reach Nurse Communication: Mobility status PT Visit Diagnosis: Muscle weakness (generalized) (M62.81);Other abnormalities of gait and mobility (R26.89)     Time: 0981-1914 PT Time Calculation (min) (ACUTE ONLY): 54 min  Charges:    $Therapeutic Exercise: 8-22 mins $Therapeutic Activity: 38-52 mins PT General Charges $$ ACUTE PT VISIT: 1 Visit                     Johny Shock, PTA Acute Rehabilitation Services Secure Chat Preferred  [phone]    Johny Shock 03/10/2023, 12:46 PM

## 2023-03-11 DIAGNOSIS — I5023 Acute on chronic systolic (congestive) heart failure: Secondary | ICD-10-CM | POA: Diagnosis not present

## 2023-03-11 DIAGNOSIS — I480 Paroxysmal atrial fibrillation: Secondary | ICD-10-CM | POA: Diagnosis not present

## 2023-03-11 DIAGNOSIS — J9621 Acute and chronic respiratory failure with hypoxia: Secondary | ICD-10-CM | POA: Diagnosis not present

## 2023-03-11 DIAGNOSIS — J9622 Acute and chronic respiratory failure with hypercapnia: Secondary | ICD-10-CM | POA: Diagnosis not present

## 2023-03-11 LAB — RENAL FUNCTION PANEL
Albumin: 2.4 g/dL — ABNORMAL LOW (ref 3.5–5.0)
Anion gap: 19 — ABNORMAL HIGH (ref 5–15)
BUN: 103 mg/dL — ABNORMAL HIGH (ref 6–20)
CO2: 19 mmol/L — ABNORMAL LOW (ref 22–32)
Calcium: 9.2 mg/dL (ref 8.9–10.3)
Chloride: 91 mmol/L — ABNORMAL LOW (ref 98–111)
Creatinine, Ser: 6.77 mg/dL — ABNORMAL HIGH (ref 0.61–1.24)
GFR, Estimated: 10 mL/min — ABNORMAL LOW (ref >60)
Glucose, Bld: 96 mg/dL (ref 70–99)
Phosphorus: 4 mg/dL (ref 2.5–4.6)
Potassium: 4.1 mmol/L (ref 3.5–5.1)
Sodium: 129 mmol/L — ABNORMAL LOW (ref 135–145)

## 2023-03-11 LAB — GLUCOSE, CAPILLARY
Glucose-Capillary: 79 mg/dL (ref 70–99)
Glucose-Capillary: 102 mg/dL — ABNORMAL HIGH (ref 70–99)
Glucose-Capillary: 103 mg/dL — ABNORMAL HIGH (ref 70–99)
Glucose-Capillary: 102 mg/dL — ABNORMAL HIGH (ref 70–99)

## 2023-03-11 LAB — COOXEMETRY PANEL
Carboxyhemoglobin: 1.9 % — ABNORMAL HIGH (ref 0.5–1.5)
Methemoglobin: <0.7 % (ref 0.0–1.5)
O2 Saturation: 59.2 %
Total hemoglobin: 7.9 g/dL — ABNORMAL LOW (ref 12.0–16.0)

## 2023-03-11 LAB — CBC
HCT: 25.5 % — ABNORMAL LOW (ref 39.0–52.0)
Hemoglobin: 8 g/dL — ABNORMAL LOW (ref 13.0–17.0)
MCH: 33.3 pg (ref 26.0–34.0)
MCHC: 31.4 g/dL (ref 30.0–36.0)
MCV: 106.3 fL — ABNORMAL HIGH (ref 80.0–100.0)
Platelets: 156 10*3/uL (ref 150–400)
RBC: 2.4 MIL/uL — ABNORMAL LOW (ref 4.22–5.81)
RDW: 18.4 % — ABNORMAL HIGH (ref 11.5–15.5)
WBC: 9.4 10*3/uL (ref 4.0–10.5)
nRBC: 0 % (ref 0.0–0.2)

## 2023-03-11 MED ORDER — ALTEPLASE 2 MG IJ SOLR: 2.0000 mg | Freq: Once | INTRAMUSCULAR | Status: DC | PRN

## 2023-03-11 MED ORDER — LIDOCAINE HCL (PF) 1 % IJ SOLN: 5.0000 mL | INTRAMUSCULAR | Status: DC | PRN

## 2023-03-11 MED ORDER — VITAMIN D 25 MCG (1000 UNIT) PO TABS
1000.0000 [IU] | ORAL_TABLET | Freq: Every day | ORAL | Status: DC
  Administered 2023-03-12 – 2023-03-23 (×11): 1000 [IU] via ORAL
  Filled 2023-03-11 (×12): qty 1

## 2023-03-11 MED ORDER — PENTAFLUOROPROP-TETRAFLUOROETH EX AERO: 1.0000 | INHALATION_SPRAY | CUTANEOUS | Status: DC | PRN

## 2023-03-11 NOTE — Progress Notes (Signed)
[facility] Progress Note   Subjective:    Seen in [facility]. Alert, conversant. Some discomfort from wounds. So far with 1.6 L off. No issues with catheter today.   Objective Vitals:   03/11/23 0930 03/11/23 1000 03/11/23 1053 03/11/23 1100  BP: (!) 119/95 92/71 114/80 102/88  Pulse: 61 60 69 93  Resp:      Temp:      TempSrc:      SpO2: 99% 98% 100% 94%  Weight:      Height:       Physical Exam General: chronically ill looking male, obese, 6L O2 [place], calm in bed today Heart: S1 and S2; Currently regular; No murmurs Lungs: Clear anteriorly/laterally Abdomen: soft, +bs Extremities: No LE edema Dialysis Access: [facility] intact  Filed Weights   03/05/23 1346 03/05/23 1753 03/09/23 1316  Weight: (!) 178.5 kg (!) 177.6 kg (!) 183.3 kg    Intake/Output Summary (Last 24 hours) at 03/11/2023 1124 Last data filed at 03/10/2023 1905 Gross per 24 hour  Intake --  Output 150 ml  Net -150 ml    Additional Objective Labs: Basic Metabolic Panel: Recent Labs  Lab 03/09/23 0546 03/09/23 1201 03/09/23 1322 03/10/23 0609 03/10/23 1430 03/11/23 0429  NA 131*  --  130* 133* 132* 129*  K 5.7*   < > 5.0 3.8 4.0 4.1  CL 90*  --  89* 92* 89* 91*  CO2 18*  --  16* 21* 25 19*  GLUCOSE 102*  --  117* 94 104* 96  BUN 154*  --  158* 86* 91* 103*  CREATININE 8.70*  --  9.02* 5.78* 6.20* 6.77*  CALCIUM 9.7  --  9.2 9.0 9.2 9.2  PHOS 5.4*  --  5.3*  --   --  4.0   < > = values in this interval not displayed.   Liver Function Tests: Recent Labs  Lab 03/06/23 0339 03/08/23 0430 03/09/23 0546 03/09/23 1322 03/10/23 0609 03/11/23 0429  AST 29 55*  --   --  60*  --   ALT 26 49*  --   --  30  --   ALKPHOS 274* 358*  --   --  393*  --   BILITOT 0.7 0.8  --   --  1.0  --   PROT 7.1 7.1  --   --  6.6  --   ALBUMIN 2.8* 2.8*   < > 2.6* 2.5* 2.4*   < > = values in this interval not displayed.   No results for input(s): "LIPASE", "AMYLASE" in the last 168 hours. CBC: Recent  Labs  Lab 03/08/23 2008 03/09/23 0216 03/09/23 0600 03/10/23 0609 03/11/23 0429  WBC 9.7 18.7* 13.0* 12.0* 9.4  NEUTROABS 9.4*  --  12.5*  --   --   HGB 8.6* 8.3* 8.3* 8.0* 8.0*  HCT 26.6* 26.4* 26.0* 24.9* 25.5*  MCV 104.3* 104.3* 103.6* 105.5* 106.3*  PLT 245 254 231 155 156   Blood Culture    Component Value Date/Time   SDES BLOOD RIGHT HAND 03/08/2023 2145   SPECREQUEST  03/08/2023 2145    BOTTLES DRAWN AEROBIC AND ANAEROBIC Blood Culture results may not be optimal due to an inadequate volume of blood received in culture bottles   CULT ENTEROBACTER CLOACAE (A) 03/08/2023 2145   REPTSTATUS 03/11/2023 FINAL 03/08/2023 2145    Cardiac Enzymes: No results for input(s): "CKTOTAL", "CKMB", "CKMBINDEX", "TROPONINI" in the last 168 hours. CBG: Recent Labs  Lab 03/10/23 1134 03/10/23  1559 03/10/23 2029 03/10/23 2351 03/11/23 0330  GLUCAP 103* 94 100* 94 79   Iron Studies: No results for input(s): "IRON", "TIBC", "TRANSFERRIN", "FERRITIN" in the last 72 hours. Lab Results  Component Value Date   INR 2.0 (H) 06/10/2022   INR 2.8 (H) 06/09/2022   INR 2.9 (H) 06/07/2022   Studies/Results: No results found.  Medications:  sodium chloride 10 mL/hr at 02/21/23 1444   ceFEPime (MAXIPIME) IV 1 g (03/10/23 2345)    apixaban  5 mg Oral BID   Chlorhexidine Gluconate Cloth  6 each Topical Q0600   cholecalciferol  1,000 Units Per Tube Daily   darbepoetin (ARANESP) injection - DIALYSIS  200 mcg Subcutaneous Q Sat-1800   digoxin  0.125 mg Oral Once per day on Monday Wednesday Friday   doxercalciferol  2 mcg Intravenous Q T,Th,Sa-HD   famotidine  20 mg Oral Daily   lidocaine  1 patch Transdermal Q24H   melatonin  5 mg Oral QHS   midodrine  30 mg Oral TID WC   multivitamin  1 tablet Oral QHS   mouth rinse  15 mL Mouth Rinse 4 times per day   senna-docusate  1 tablet Oral QHS    Dialysis Orders: [facility] TTS  4h   180kg  425/1.5  TDC  Na citrate cath lock (no heparin) -  last OP HD was 6/25, post wt 193.8kg - consistently left HD 8-12kg up at least the prior 3 wks - sensipar 60mg  three times per week - hectorol 4 mcg IV three times per week - mircera 200 mcg  IV q 2 wks, last 6/13, due 6/27  Assessment/Plan: # Acute/ chronic hypotension - came off pressors 8/03, cont midodrine 30 tid.    # Pneumococcal bacteremia/Enterobacter cloacae bacteremia: admitting diagnosis w/ septic shock. Complex course. Blood cultures from 8/13 (+) Enterobacter cloacae. Now on IV Cefepime. Line holiday suggested, however, given urgent dialysis needs, this is not appropriate for this time, will re-visit this once he is more clinically stable.   # Acute hypoxic and hypercapnic respiratory failure: extubated on 7/20.  Stable on lower dose of O2 at 3-6 lpm HFNC to [place].    # Volume - mild LE edema, wt's down 178 . Need to keep his volume under control, e.g. ideal wt prob around 173- 176kg    # ESRD - usual HD is TTS. Required emergent HD on 6/29 because of fluid overload.  Started CRRT on 6/29 due to shock.  CRRT dc'd 8/07 as shock had resolved. Back on iHD TTS. Needs to be positioned in bed properly right before going to dialysis. Missed HD 8/13 2nd TDC malfunction (see below). Received HD 8/14 (2L removed). Next HD 8/16 and 8/17 to place him back on TTS schedule.   # Agitation - while on dialysis, related to decubitus ulcers and associated pain. Before dialysis make sure patient is positioned properly in the bed.    # HD access - 8/2 required temp HD catheter placement after [facility] malfunctioned. S/p TDC IR 8/14-TDC appears to be functioning well    # Prognosis - with uncontrolled afib, pressure sores, and other comorbidities prognosis is poor.   # Anemia of CKD:  on max ESA with variable Hbs, generally in the 9s.    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 Have resumed IV hectorol (half dose) since Ca++ levels are back to normal.    # Acute on chronic systolic CHF, nonischemic: EF 45-40%. Per  Cardiology, not a candidate  for AV node ablation, ICD, or pacemake d/t infection risk. Also not a candidate for advanced heart failure therapies.   #A-fib with RVR with hypotension: S/P cardioversion 7/5 -> NSR -> afib RVR 7/6 early AM refractory to [facility] x3 + amiodarone. Persistent afib/flutter w/ HR > 100  Tomasa Blase PA-C [facility] 03/11/2023,11:26 AM

## 2023-03-11 NOTE — Procedures (Signed)
I was present at this dialysis session. I have reviewed the session itself and made appropriate changes.   Filed Weights   03/05/23 1346 03/05/23 1753 03/09/23 1316  Weight: (!) 178.5 kg (!) 177.6 kg (!) 183.3 kg    Recent Labs  Lab 03/11/23 0429  NA 129*  K 4.1  CL 91*  CO2 19*  GLUCOSE 96  BUN 103*  CREATININE 6.77*  CALCIUM 9.2  PHOS 4.0    Recent Labs  Lab 03/08/23 2008 03/09/23 0216 03/09/23 0600 03/10/23 0609 03/11/23 0429  WBC 9.7   < > 13.0* 12.0* 9.4  NEUTROABS 9.4*  --  12.5*  --   --   HGB 8.6*   < > 8.3* 8.0* 8.0*  HCT 26.6*   < > 26.0* 24.9* 25.5*  MCV 104.3*   < > 103.6* 105.5* 106.3*  PLT 245   < > 231 155 156   < > = values in this interval not displayed.    Scheduled Meds:  apixaban  5 mg Oral BID   Chlorhexidine Gluconate Cloth  6 each Topical Q0600   cholecalciferol  1,000 Units Per Tube Daily   darbepoetin (ARANESP) injection - DIALYSIS  200 mcg Subcutaneous Q Sat-1800   digoxin  0.125 mg Oral Once per day on Monday Wednesday Friday   doxercalciferol  2 mcg Intravenous Q T,Th,Sa-HD   famotidine  20 mg Oral Daily   lidocaine  1 patch Transdermal Q24H   melatonin  5 mg Oral QHS   midodrine  30 mg Oral TID WC   multivitamin  1 tablet Oral QHS   mouth rinse  15 mL Mouth Rinse 4 times per day   senna-docusate  1 tablet Oral QHS   Continuous Infusions:  sodium chloride 10 mL/hr at 02/21/23 1444   ceFEPime (MAXIPIME) IV 1 g (03/10/23 2345)   PRN Meds:.sodium chloride, acetaminophen **OR** acetaminophen (TYLENOL) oral liquid 160 mg/5 mL, alteplase, bisacodyl, calamine, docusate sodium **OR** docusate, gabapentin, guaiFENesin-dextromethorphan, haloperidol lactate, hydrocortisone cream, hydrOXYzine, iohexol, lidocaine, lidocaine (PF), ondansetron (ZOFRAN) IV, mouth rinse, oxyCODONE, pentafluoroprop-tetrafluoroeth, polyethylene glycol, polyvinyl alcohol, senna-docusate, surgical lubricant   Louie Bun,  MD 03/11/2023, 11:38 AM

## 2023-03-11 NOTE — Progress Notes (Signed)
Subjective:   Summary: Phillip Young is a 37 y.o. year old male currently admitted on the IMTS HD#49 for Acute on chronic hypoxic respiratory failure, ESRD on HD.  Subjective:  Saw patient at bedside this morning. Patient was alert and oriented, feeling very well. Talking more clearly than usual. Updated on plan to have possible family meeting with palliative care today. Encouraged to tolerate dialysis today as best he could. Patient reported he would try.   Objective:  Vital signs in last 24 hours: Vitals:   03/10/23 2033 03/10/23 2350 03/11/23 0525 03/11/23 0813  BP: (!) 100/49 (!) 108/91 (!) 161/141 100/65  Pulse: (!) 139 66 69 (!) 116  Resp: 15 16 (!) 29   Temp: 98.4 F (36.9 C) 98.4 F (36.9 C)  (!) 97.4 F (36.3 C)  TempSrc: Oral Oral Oral   SpO2: 96% 100% 98% 97%  Weight:      Height:       Supplemental O2: HFNC SpO2: 97 % O2 Flow Rate (L/min): 6 L/min FiO2 (%): 30 %   Physical Exam:  Constitutional: chronically ill appearing, laying in bed, in no acute distress. Tremulous Cardiovascular: RRR, no murmurs, rubs or gallops Pulmonary/Chest: normal work of breathing on room air, lungs clear to auscultation bilaterally Abdominal: soft, non-tender, non-distended Skin: warm and dry Extremities: upper/lower extremity pulses 2+, no lower extremity edema present  Filed Weights   03/05/23 1346 03/05/23 1753 03/09/23 1316  Weight: (!) 178.5 kg (!) 177.6 kg (!) 183.3 kg     Intake/Output Summary (Last 24 hours) at 03/11/2023 0818 Last data filed at 03/10/2023 1905 Gross per 24 hour  Intake --  Output 150 ml  Net -150 ml   Net IO Since Admission: 9,873.89 mL [03/11/23 0818]  Pertinent Labs:    Latest Ref Rng & Units 03/11/2023    4:29 AM 03/10/2023    6:09 AM 03/09/2023    6:00 AM  CBC  WBC 4.0 - 10.5 K/uL 9.4  12.0  13.0   Hemoglobin 13.0 - 17.0 g/dL 8.0  8.0  8.3   Hematocrit 39.0 - 52.0 % 25.5  24.9  26.0   Platelets 150 - 400 K/uL 156  155   231        Latest Ref Rng & Units 03/11/2023    4:29 AM 03/10/2023    2:30 PM 03/10/2023    6:09 AM  CMP  Glucose 70 - 99 mg/dL 96  161  94   BUN 6 - 20 mg/dL 096  91  86   Creatinine 0.61 - 1.24 mg/dL 0.45  4.09  8.11   Sodium 135 - 145 mmol/L 129  132  133   Potassium 3.5 - 5.1 mmol/L 4.1  4.0  3.8   Chloride 98 - 111 mmol/L 91  89  92   CO2 22 - 32 mmol/L 19  25  21    Calcium 8.9 - 10.3 mg/dL 9.2  9.2  9.0   Total Protein 6.5 - 8.1 g/dL   6.6   Total Bilirubin 0.3 - 1.2 mg/dL   1.0   Alkaline Phos 38 - 126 U/L   393   AST 15 - 41 U/L   60   ALT 0 - 44 U/L   30     Imaging: No results found.   Assessment/Plan:   Principal Problem:   Acute hypoxic respiratory failure ([name]) Active Problems:  Morbid obesity ([name])   Chronic systolic CHF (congestive heart failure) ([name])   Cardiogenic shock ([name])   ESRD (end stage renal disease) ([name])   ESRD (end stage renal disease) on dialysis ([name])   ESRD on dialysis ([name])   Hypervolemia   Sepsis with acute organ dysfunction without septic shock ([name])   Atrial fibrillation ([name])   Pressure injury of skin   Central line infection   Patient Summary: Phillip Young is a 37 y.o. with a pertinent PMH of chronic hypoxic respiratory failure, ESRD, HFrEF with EF 20-25%, refractory Afib, amiodarone induced lung toxicity, who presented with fevers, joint pains, SOB and admitted for sepsis, now resolved.   #Acute on chronic hypoxemic/hypercapnic respiratory failure #Acute metabolic encephalopathy in setting of hypercapnia Patient was transferred from ICU on 8/9 after extubation and pressors were discontinued. Mental status has improved, patient is currently alert and following commands.  -Currently on 7L HFNC with O2 sats in mid 90s -Noncompliant with CPAP at home, nurses note patient has been refusing BiPAP at night -Encourage incentive spirometry and PT/OT  #Acute on chronic HFrEF with pulmonary edema #Paroxysmal Afib/flutter with  RVR #Nonischemic cardiomyopathy EF less than 20% -Remains in Afib with RVR, HR ranging up to 150s -Patient intolerant of metoprolol and carvedilol due to allergy, amiodarone due to lung toxicity, diltiazem due to extremely reduced EF -Cardiology consulting, recommending digoxin every other day (non-dialysis days) and frequent digoxin levels. Appreciate recs -Digoxin level 0.6 -Continue Eliquis for stroke prophylaxis -Palliative team consulted, planning family meeting soon  #ESRD on HD T/Th/S #Recurrent metabolic acidosis, improving HD could not be performed 8/13 due to clotted TDC. -Nephrology following, appreciate assistance -IR replaced [facility] 8/14, appreciate assistance. -Patient has had trouble tolerating due to skin breakdown and TDC clotting. Haldol as needed for agitation -Wound care reconsulted for ongoing skin breakdown making patient intolerant of dialysis. No further recs -Midodrine 30 TID for hypotension  #Hospital Acquired Pneumonia #Bloodstream infection Patient developed new leukocytosis to 18.7, CXR showing pulmonary edema with new pleural effusions. Blood cultures on 8/15 positive for enterobacter cloacae.  -Antibiotics transitioned to cefepime -Line holiday suggested, however given urgent dialysis needs this is not appropriate at this time  #Hypervolemic hyponatremia #Hyperphosphatemia #Hyperkalemia #Hypocalcemia -Recurrent hyperkalemia. Treating as needed with lokelma, insulin/D50, albuterol, calcium gluconate -Monitor electrolytes with daily labs, supplement as necessary -Tube feeds d/c 8/12. Monitor blood glucose with frequent checks  #Anemia of chronic disease Monitor with daily labs, transfuse as needed  #Skin breakdown Perineal and sacral skin breakdown. Patient refuses rectal tube -Continue local wound care -Wound care reconsulted, no new recs  #Mixed Septic and Cardiogenic Shock -Resolved, transferred to floor from ICU on 8/9  Diet: Normal IVF: None,    VTE: DOAC Code: Full PT/OT recs:   Dispo: Anticipated discharge to Skilled nursing facility pending medical stability Monna Fam, MD PGY-1 Internal Medicine Resident Pager Number [phone] Please contact the on call pager after 5 pm and on weekends at [phone].

## 2023-03-11 NOTE — Progress Notes (Signed)
MD notified of HR 120s-130s in Afib during shift change,instructed to continue to monitor for distress and HR above 150.   Patient denied CPAP at 2100 & 0200, 0500-pt requested RT to come place his CPAP on. RT called by RN.

## 2023-03-11 NOTE — Progress Notes (Signed)
Received patient in bed.Awake,alert and oriented x 4.Consent verified.  Access used. New right HD catheter that worked well. Dressing on date.  Medicines given: Midodrine  60 mg.                             Oxycodone 10 mg.                             Hectorol 2 mcg.            Duration of treatment: 3.5 hours.  Fluid removed : Met 3 liters UF goal.  Hemo comment:Tolerated treatment.  Hand off to the patient's nurse.

## 2023-03-11 NOTE — Plan of Care (Signed)
  Problem: Clinical Measurements: Goal: Ability to maintain clinical measurements within normal limits will improve Outcome: Not Progressing Goal: Will remain free from infection Outcome: Not Progressing Goal: Diagnostic test results will improve Outcome: Not Progressing Goal: Respiratory complications will improve Outcome: Not Progressing Goal: Cardiovascular complication will be avoided Outcome: Not Progressing   Problem: Activity: Goal: Risk for activity intolerance will decrease Outcome: Not Progressing   Problem: Skin Integrity: Goal: Risk for impaired skin integrity will decrease Outcome: Not Progressing

## 2023-03-11 NOTE — Progress Notes (Signed)
Occupational Therapy Treatment Patient Details Name: Phillip Young MRN: [id number] DOB: [birth year] Today's Date: 03/11/2023   History of present illness Pt is a 37 y/o M presenting to ED on 6/28 with fever, joint pain, and SOB after missing HD. Admitted for acute hypoxic respiratory failure. Intubated and started on CRRT, vasopressor support on 6/30. Extubated 7/20. Underwent DCCV 7/5, 7/6, and 7/7 without success. Pt off CRRT on 8/7.  PMH includes ESRD, NICM, refractory A fib, HFrEF   OT comments  Patient not agreeable to tilting this session, but was able to sit EOB with Max A for about 10 min.  Patient was initially dizzy, but recovered quickly.  Limited by buttock pain.  OT will continue efforts in the acute setting, and Patient will benefit from continued inpatient follow up therapy, <3 hours/day to maximize his functional status and assist with wheelchair fitting.         If plan is discharge home, recommend the following:  Two people to help with walking and/or transfers;Two people to help with bathing/dressing/bathroom;Assistance with cooking/housework;Assistance with feeding;Direct supervision/assist for medications management;Direct supervision/assist for financial management;Help with stairs or ramp for entrance;Assist for transportation   Equipment Recommendations  Hospital bed;Wheelchair cushion (measurements OT);Wheelchair (measurements OT)    Recommendations for Other Services      Precautions / Restrictions Precautions Precautions: Fall Precaution Comments: wounds on hip/back/buttocks Restrictions Weight Bearing Restrictions: No       Mobility Bed Mobility Overal bed mobility: Needs Assistance Bed Mobility: Rolling, Sidelying to Sit, Sit to Sidelying Rolling: Max assist Sidelying to sit: Max assist     Sit to sidelying: Max assist   Patient Response: Cooperative  Transfers                   General transfer comment: Attepted tilt bed, patient not  able to tolerate his feet on the footboard.     Balance Overall balance assessment: Needs assistance Sitting-balance support: Feet unsupported, Bilateral upper extremity supported Sitting balance-Leahy Scale: Poor                                     ADL either performed or assessed with clinical judgement   ADL   Eating/Feeding: Set up;Bed level   Grooming: Set up;Bed level   Upper Body Bathing: Maximal assistance;Bed level   Lower Body Bathing: Bed level;Total assistance   Upper Body Dressing : Moderate assistance;Bed level   Lower Body Dressing: Total assistance;Bed level       Toileting- Clothing Manipulation and Hygiene: Total assistance;Bed level              Extremity/Trunk Assessment Upper Extremity Assessment RUE Deficits / Details: unable to flex shoulder without assists, elbow/wrist/hand ROM 3+/5 RUE Coordination: decreased fine motor;decreased gross motor   Lower Extremity Assessment Lower Extremity Assessment: Defer to PT evaluation                          Cognition Arousal: Alert Behavior During Therapy: WFL for tasks assessed/performed Overall Cognitive Status: Impaired/Different from baseline Area of Impairment: Safety/judgement                         Safety/Judgement: Decreased awareness of deficits  Pertinent Vitals/ Pain       Pain Assessment Pain Assessment: Faces Faces Pain Scale: Hurts even more Pain Location: buttocks, B feet/ankles Pain Descriptors / Indicators: Discomfort, Aching, Grimacing Pain Intervention(s): Monitored during session                                                          Frequency  Min 1X/week        Progress Toward Goals  OT Goals(current goals can now be found in the care plan section)  Progress towards OT goals: Progressing toward goals  Acute Rehab OT Goals OT Goal Formulation: With  patient Time For Goal Achievement: 03/21/23 Potential to Achieve Goals: Fair  Plan      Co-evaluation                 AM-PAC OT "6 Clicks" Daily Activity     Outcome Measure   Help from another person eating meals?: A Little Help from another person taking care of personal grooming?: A Little Help from another person toileting, which includes using toliet, bedpan, or urinal?: Total Help from another person bathing (including washing, rinsing, drying)?: A Lot Help from another person to put on and taking off regular upper body clothing?: A Lot Help from another person to put on and taking off regular lower body clothing?: Total 6 Click Score: 12    End of Session Equipment Utilized During Treatment: Oxygen  OT Visit Diagnosis: Unsteadiness on feet (R26.81);Other abnormalities of gait and mobility (R26.89);Muscle weakness (generalized) (M62.81);Other symptoms and signs involving cognitive function   Activity Tolerance Patient limited by pain   Patient Left in bed;with call bell/phone within reach   Nurse Communication Mobility status        Time: 1540-1605 OT Time Calculation (min): 25 min  Charges: OT General Charges $OT Visit: 1 Visit OT Treatments $Self Care/Home Management : 23-37 mins  03/11/2023  RP, OTR/L  Acute Rehabilitation Services  Office:  [phone]   Suzanna Obey 03/11/2023, 4:12 PM

## 2023-03-11 NOTE — Progress Notes (Signed)
SLP Cancellation Note  Patient Details Name: Phillip Young MRN: [id number] DOB: [birth year]   Cancelled treatment:        Pt off floor for HD after missing session earlier this week.  SLP will continue to follow.     Kerrie Pleasure, MA, CCC-SLP Acute Rehabilitation Services Office: [phone] 03/11/2023, 8:34 AM

## 2023-03-12 DIAGNOSIS — J9622 Acute and chronic respiratory failure with hypercapnia: Secondary | ICD-10-CM | POA: Diagnosis not present

## 2023-03-12 DIAGNOSIS — J9621 Acute and chronic respiratory failure with hypoxia: Secondary | ICD-10-CM | POA: Diagnosis not present

## 2023-03-12 DIAGNOSIS — Z87891 Personal history of nicotine dependence: Secondary | ICD-10-CM | POA: Diagnosis not present

## 2023-03-12 DIAGNOSIS — G9341 Metabolic encephalopathy: Secondary | ICD-10-CM

## 2023-03-12 LAB — COMPREHENSIVE METABOLIC PANEL
ALT: 15 U/L (ref 0–44)
AST: 56 U/L — ABNORMAL HIGH (ref 15–41)
Albumin: 2.5 g/dL — ABNORMAL LOW (ref 3.5–5.0)
Alkaline Phosphatase: 363 U/L — ABNORMAL HIGH (ref 38–126)
Anion gap: 15 (ref 5–15)
BUN: 63 mg/dL — ABNORMAL HIGH (ref 6–20)
CO2: 23 mmol/L (ref 22–32)
Calcium: 9.8 mg/dL (ref 8.9–10.3)
Chloride: 91 mmol/L — ABNORMAL LOW (ref 98–111)
Creatinine, Ser: 5.27 mg/dL — ABNORMAL HIGH (ref 0.61–1.24)
GFR, Estimated: 13 mL/min — ABNORMAL LOW (ref >60)
Glucose, Bld: 90 mg/dL (ref 70–99)
Potassium: 4.3 mmol/L (ref 3.5–5.1)
Sodium: 129 mmol/L — ABNORMAL LOW (ref 135–145)
Total Bilirubin: 1 mg/dL (ref 0.3–1.2)
Total Protein: 6.4 g/dL — ABNORMAL LOW (ref 6.5–8.1)

## 2023-03-12 LAB — GLUCOSE, CAPILLARY
Glucose-Capillary: 101 mg/dL — ABNORMAL HIGH (ref 70–99)
Glucose-Capillary: 106 mg/dL — ABNORMAL HIGH (ref 70–99)
Glucose-Capillary: 152 mg/dL — ABNORMAL HIGH (ref 70–99)
Glucose-Capillary: 93 mg/dL (ref 70–99)

## 2023-03-12 LAB — CBC
HCT: 27.5 % — ABNORMAL LOW (ref 39.0–52.0)
Hemoglobin: 8.7 g/dL — ABNORMAL LOW (ref 13.0–17.0)
MCH: 32.3 pg (ref 26.0–34.0)
MCHC: 31.6 g/dL (ref 30.0–36.0)
MCV: 102.2 fL — ABNORMAL HIGH (ref 80.0–100.0)
Platelets: 169 10*3/uL (ref 150–400)
RBC: 2.69 MIL/uL — ABNORMAL LOW (ref 4.22–5.81)
RDW: 18.2 % — ABNORMAL HIGH (ref 11.5–15.5)
WBC: 7.6 10*3/uL (ref 4.0–10.5)
nRBC: 0 % (ref 0.0–0.2)

## 2023-03-12 LAB — COOXEMETRY PANEL
Carboxyhemoglobin: 1 % (ref 0.5–1.5)
Methemoglobin: <0.7 % (ref 0.0–1.5)
O2 Saturation: 55.6 %
Total hemoglobin: 9.1 g/dL — ABNORMAL LOW (ref 12.0–16.0)

## 2023-03-12 MED ORDER — GABAPENTIN 100 MG PO CAPS
100.0000 mg | ORAL_CAPSULE | Freq: Three times a day (TID) | ORAL | Status: DC
  Administered 2023-03-12 – 2023-03-23 (×30): 100 mg via ORAL
  Filled 2023-03-12 (×33): qty 1

## 2023-03-12 MED ORDER — ANTICOAGULANT SODIUM CITRATE 4% (200MG/5ML) IV SOLN
5.0000 mL | Status: DC | PRN
  Administered 2023-03-12: 3800 mL
  Filled 2023-03-12: qty 5

## 2023-03-12 NOTE — Progress Notes (Signed)
[facility] Progress Note   Assessment/Plan: # Acute/ chronic hypotension - came off pressors 8/03, cont midodrine 30 tid.    # Pneumococcal bacteremia/Enterobacter cloacae bacteremia: admitting diagnosis w/ septic shock. Complex course. Blood cultures from 8/13 (+) Enterobacter cloacae. Now on IV Cefepime. Line holiday suggested, however, given urgent dialysis needs, this is not appropriate for this time, will re-visit this once he is more clinically stable.   # Acute hypoxic and hypercapnic respiratory failure: extubated on 7/20.  Stable on lower dose of O2 at 3-6 lpm HFNC to [place].    # Volume - mild LE edema, wt's down 178 . Need to keep his volume under control, e.g. ideal wt prob around 173- 176kg    # ESRD - usual HD is TTS. Required emergent HD on 6/29 because of fluid overload.  Started CRRT on 6/29 due to shock.  CRRT dc'd 8/07 as shock had resolved. Back on iHD TTS. Needs to be positioned in bed properly right before going to dialysis. Missed HD 8/13 2nd TDC malfunction (see below). Received HD 8/14 (2L removed). Next HD 8/16 and 8/17 to place him back on TTS schedule.   # Agitation - while on dialysis, related to decubitus ulcers and associated pain. Before dialysis make sure patient is positioned properly in the bed.    # HD access - 8/2 required temp HD catheter placement after [facility] malfunctioned. S/p TDC IR 8/14-TDC appears to be functioning well      # Anemia of CKD:  on max ESA with variable Hbs, generally in the 9s.    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 Have resumed IV hectorol (half dose) since Ca++ levels are back to normal.    # Acute on chronic systolic CHF, nonischemic: EF 56-21%. Per Cardiology, not a candidate for AV node ablation, ICD, or pacemake d/t infection risk. Also not a candidate for advanced heart failure therapies.   #A-fib with RVR with hypotension: S/P cardioversion 7/5 -> NSR -> afib RVR 7/6 early AM refractory to [facility] x3 + amiodarone.  Persistent afib/flutter w/ HR > 100  # Prognosis - with uncontrolled afib, pressure sores, and other comorbidities prognosis is poor.   Dialysis Orders: [facility] TTS  4h   180kg  425/1.5  TDC  Na citrate cath lock (no heparin) - last OP HD was 6/25, post wt 193.8kg    Subjective:    Seen in [facility]- starting dialysis . Didn't have a good night, uncomfortable on mattress, having pain in feet today.   Objective Vitals:   03/12/23 0800 03/12/23 0829 03/12/23 0845 03/12/23 0900  BP: 108/80 101/73 114/89 96/64  Pulse: (!) 117 83 (!) 108 (!) 111  Resp: 15 16 15 14   Temp: 98.1 F (36.7 C) 98.4 F (36.9 C)    TempSrc: Oral     SpO2: 99% 100% 100% 99%  Weight:      Height:       Physical Exam General: chronically ill appearing  Heart: Irregular  Lungs: Clear bilaterally  Abdomen: obese, soft  Extremities: Trace LE edema Dialysis Access: [facility]   Filed Weights   03/11/23 0815 03/11/23 1214 03/12/23 0500  Weight: (!) 183.7 kg (!) 180.6 kg (!) 178.4 kg    Intake/Output Summary (Last 24 hours) at 03/12/2023 3086 Last data filed at 03/11/2023 1206 Gross per 24 hour  Intake --  Output 3000 ml  Net -3000 ml    Additional Objective Labs: Basic Metabolic Panel: Recent Labs  Lab  03/09/23 0546 03/09/23 1201 03/09/23 1322 03/10/23 0609 03/10/23 1430 03/11/23 0429 03/12/23 0528  NA 131*  --  130*   < > 132* 129* 129*  K 5.7*   < > 5.0   < > 4.0 4.1 4.3  CL 90*  --  89*   < > 89* 91* 91*  CO2 18*  --  16*   < > 25 19* 23  GLUCOSE 102*  --  117*   < > 104* 96 90  BUN 154*  --  158*   < > 91* 103* 63*  CREATININE 8.70*  --  9.02*   < > 6.20* 6.77* 5.27*  CALCIUM 9.7  --  9.2   < > 9.2 9.2 9.8  PHOS 5.4*  --  5.3*  --   --  4.0  --    < > = values in this interval not displayed.   Liver Function Tests: Recent Labs  Lab 03/08/23 0430 03/09/23 0546 03/10/23 0609 03/11/23 0429 03/12/23 0528  AST 55*  --  60*  --  56*  ALT 49*  --  30  --  15  ALKPHOS 358*  --  393*   --  363*  BILITOT 0.8  --  1.0  --  1.0  PROT 7.1  --  6.6  --  6.4*  ALBUMIN 2.8*   < > 2.5* 2.4* 2.5*   < > = values in this interval not displayed.   No results for input(s): "LIPASE", "AMYLASE" in the last 168 hours. CBC: Recent Labs  Lab 03/08/23 2008 03/09/23 0216 03/09/23 0600 03/10/23 0609 03/11/23 0429 03/12/23 0528  WBC 9.7 18.7* 13.0* 12.0* 9.4 7.6  NEUTROABS 9.4*  --  12.5*  --   --   --   HGB 8.6* 8.3* 8.3* 8.0* 8.0* 8.7*  HCT 26.6* 26.4* 26.0* 24.9* 25.5* 27.5*  MCV 104.3* 104.3* 103.6* 105.5* 106.3* 102.2*  PLT 245 254 231 155 156 169   Blood Culture    Component Value Date/Time   SDES BLOOD RIGHT HAND 03/08/2023 2145   SPECREQUEST  03/08/2023 2145    BOTTLES DRAWN AEROBIC AND ANAEROBIC Blood Culture results may not be optimal due to an inadequate volume of blood received in culture bottles   CULT ENTEROBACTER CLOACAE (A) 03/08/2023 2145   REPTSTATUS 03/11/2023 FINAL 03/08/2023 2145    Cardiac Enzymes: No results for input(s): "CKTOTAL", "CKMB", "CKMBINDEX", "TROPONINI" in the last 168 hours. CBG: Recent Labs  Lab 03/11/23 1309 03/11/23 1610 03/11/23 2030 03/12/23 0000 03/12/23 0428  GLUCAP 102* 103* 102* 101* 93   Iron Studies: No results for input(s): "IRON", "TIBC", "TRANSFERRIN", "FERRITIN" in the last 72 hours. Lab Results  Component Value Date   INR 2.0 (H) 06/10/2022   INR 2.8 (H) 06/09/2022   INR 2.9 (H) 06/07/2022   Studies/Results: No results found.  Medications:  sodium chloride 10 mL/hr at 02/21/23 1444   anticoagulant sodium citrate     ceFEPime (MAXIPIME) IV 1 g (03/11/23 2142)    apixaban  5 mg Oral BID   Chlorhexidine Gluconate Cloth  6 each Topical Q0600   cholecalciferol  1,000 Units Oral Daily   darbepoetin (ARANESP) injection - DIALYSIS  200 mcg Subcutaneous Q Sat-1800   digoxin  0.125 mg Oral Once per day on Monday Wednesday Friday   doxercalciferol  2 mcg Intravenous Q T,Th,Sa-HD   famotidine  20 mg Oral Daily    lidocaine  1 patch Transdermal Q24H   melatonin  5  mg Oral QHS   midodrine  30 mg Oral TID WC   multivitamin  1 tablet Oral QHS   mouth rinse  15 mL Mouth Rinse 4 times per day   senna-docusate  1 tablet Oral QHS      Tomasa Blase PA-C  [facility] 03/12/2023,9:25 AM

## 2023-03-12 NOTE — TOC Progression Note (Signed)
Transition of Care [facility]) - Progression Note    Patient Details  Name: Phillip Young MRN: [id number] Date of Birth: September 06, 1985  [facility] CM/SW Contact  Elliot Cousin, RN Phone Number: [phone] 03/12/2023, 9:18 AM  Clinical Narrative:   CM spoke to patient and offered choice for LTAC. Pt agreeable to [facility]. Will send referral to [facility] rep, Latoya for review.     Expected Discharge Plan: Home/Self Care Barriers to Discharge: Continued Medical Work up  Expected Discharge Plan and Services   Discharge Planning Services: CM Consult   Living arrangements for the past 2 months: Single Family Home                                       Social Determinants of Health (SDOH) Interventions SDOH Screenings   Food Insecurity: No Food Insecurity (01/22/2023)  Housing: Low Risk  (01/22/2023)  Transportation Needs: No Transportation Needs (01/22/2023)  Utilities: Not At Risk (01/22/2023)  Alcohol Screen: Low Risk  (09/21/2022)  Depression (PHQ2-9): High Risk (09/21/2022)  Financial Resource Strain: High Risk (09/21/2022)  Physical Activity: Inactive (09/21/2022)  Social Connections: Unknown (09/21/2022)  Stress: No Stress Concern Present (09/21/2022)  Tobacco Use: Medium Risk (01/21/2023)    Readmission Risk Interventions    06/09/2022   12:19 PM 05/12/2021   10:35 AM 10/16/2020    2:10 PM  Readmission Risk Prevention Plan  Transportation Screening Complete Complete Complete  PCP or Specialist Appt within 5-7 Days   Complete  Home Care Screening   Complete  Medication Review (RN CM)   Referral to Pharmacy  Medication Review (RN Care Manager) Complete Complete   PCP or Specialist appointment within 3-5 days of discharge Complete Complete   HRI or Home Care Consult Complete Complete   SW Recovery Care/Counseling Consult Complete Complete   Palliative Care Screening Not Applicable Complete   Skilled Nursing Facility Not Applicable Complete

## 2023-03-12 NOTE — Plan of Care (Signed)
  Problem: Education: Goal: Knowledge of General Education information will improve Description: Including pain rating scale, medication(s)/side effects and non-pharmacologic comfort measures Outcome: Progressing   Problem: Clinical Measurements: Goal: Respiratory complications will improve Outcome: Progressing   Problem: Nutrition: Goal: Adequate nutrition will be maintained Outcome: Progressing   Problem: Coping: Goal: Level of anxiety will decrease Outcome: Progressing   Problem: Skin Integrity: Goal: Risk for impaired skin integrity will decrease Outcome: Progressing   Problem: Health Behavior/Discharge Planning: Goal: Ability to manage health-related needs will improve Outcome: Not Progressing   Problem: Clinical Measurements: Goal: Ability to maintain clinical measurements within normal limits will improve Outcome: Not Progressing   Problem: Activity: Goal: Risk for activity intolerance will decrease Outcome: Not Progressing

## 2023-03-12 NOTE — Progress Notes (Signed)
Subjective:   Summary: Phillip Young is a 37 y.o. year old male currently admitted on the IMTS HD#50 for Acute on chronic hypoxic respiratory failure, ESRD on HD.  Subjective:  Pt seen bedside this AM. He states that his feet are hurting and he is having decreased sensation. This has been going on for about two weeks. He has tried gabapentin before but has not worked previously. Pt is asking for more juice.   Objective:  Vital signs in last 24 hours: Vitals:   03/12/23 0930 03/12/23 1000 03/12/23 1030 03/12/23 1100  BP: 106/74 97/76 116/60 (!) 107/58  Pulse: (!) 143 80 (!) 44 64  Resp: 19 20 20 19   Temp:      TempSrc:      SpO2: 100% 98% 99% 100%  Weight:      Height:       Supplemental O2: HFNC SpO2: 100 % O2 Flow Rate (L/min): 5 L/min FiO2 (%): 30 %   Physical Exam:  Constitutional: chronically ill appearing, laying in bed, in no acute distress.  Cardiovascular: Irregular rate and rhythm, no murmurs, rubs or gallops Pulmonary/Chest: Anterior lung exam showed decreased breath sounds bilaterally  Abdominal: soft, non-tender, non-distended Skin: warm and dry Extremities: upper/lower extremity pulses 2+, no lower extremity edema present, tenderness to palpation in bilateral lower extremities up to mid-shin.  Neuro: Decreased sensation in medial aspect of right foot, moves lower extremities spontaneously  Filed Weights   03/11/23 0815 03/11/23 1214 03/12/23 0500  Weight: (!) 183.7 kg (!) 180.6 kg (!) 178.4 kg     Intake/Output Summary (Last 24 hours) at 03/12/2023 1109 Last data filed at 03/11/2023 1206 Gross per 24 hour  Intake --  Output 3000 ml  Net -3000 ml   Net IO Since Admission: 6,873.89 mL [03/12/23 1109]  Pertinent Labs:    Latest Ref Rng & Units 03/12/2023    5:28 AM 03/11/2023    4:29 AM 03/10/2023    6:09 AM  CBC  WBC 4.0 - 10.5 K/uL 7.6  9.4  12.0   Hemoglobin 13.0 - 17.0 g/dL 8.7  8.0  8.0   Hematocrit 39.0 - 52.0 % 27.5  25.5   24.9   Platelets 150 - 400 K/uL 169  156  155        Latest Ref Rng & Units 03/12/2023    5:28 AM 03/11/2023    4:29 AM 03/10/2023    2:30 PM  CMP  Glucose 70 - 99 mg/dL 90  96  161   BUN 6 - 20 mg/dL 63  096  91   Creatinine 0.61 - 1.24 mg/dL 0.45  4.09  8.11   Sodium 135 - 145 mmol/L 129  129  132   Potassium 3.5 - 5.1 mmol/L 4.3  4.1  4.0   Chloride 98 - 111 mmol/L 91  91  89   CO2 22 - 32 mmol/L 23  19  25    Calcium 8.9 - 10.3 mg/dL 9.8  9.2  9.2   Total Protein 6.5 - 8.1 g/dL 6.4     Total Bilirubin 0.3 - 1.2 mg/dL 1.0     Alkaline Phos 38 - 126 U/L 363     AST 15 - 41 U/L 56     ALT 0 - 44 U/L 15       Imaging: No results found.   Assessment/Plan:   Principal Problem:  Acute hypoxic respiratory failure ([name]) Active Problems:   Morbid obesity ([name])   Chronic systolic CHF (congestive heart failure) ([name])   Cardiogenic shock ([name])   ESRD (end stage renal disease) ([name])   ESRD (end stage renal disease) on dialysis ([name])   ESRD on dialysis ([name])   Hypervolemia   Sepsis with acute organ dysfunction without septic shock ([name])   Atrial fibrillation ([name])   Pressure injury of skin   Central line infection, subsequent encounter   Patient Summary: Phillip Young is a 37 y.o. with a pertinent PMH of chronic hypoxic respiratory failure, ESRD, HFrEF with EF 20-25%, refractory Afib, amiodarone induced lung toxicity, who presented with fevers, joint pains, SOB and admitted for sepsis, now resolved.   #Acute on chronic hypoxemic/hypercapnic respiratory failure #Acute metabolic encephalopathy in setting of hypercapnia Patient was transferred from ICU on 8/9 after extubation and pressors were discontinued. Mental status has improved, patient is currently alert and following commands.  -Currently on 6L HFNC with O2 sats in mid 90s -Noncompliant with CPAP at home, nurses note patient has been refusing BiPAP at night -Encourage incentive spirometry and PT/OT  #Acute on chronic  HFrEF with pulmonary edema #Paroxysmal Afib/flutter with RVR #Nonischemic cardiomyopathy EF less than 20% -Remains in Afib w, HR ranging up to 150s -Patient intolerant of metoprolol and carvedilol due to allergy, amiodarone due to lung toxicity, diltiazem due to extremely reduced EF -Cardiology signed off, however recommending digoxin every other day (non-dialysis days) and frequent digoxin levels. Appreciate recs -Continue Eliquis for stroke prophylaxis -Palliative team consulted, planning family meeting soon  #ESRD on HD T/Th/S #Recurrent metabolic acidosis, improving S/P TDC repair on 8/14, has been receiving dialysis. Per notation, patient has consistently required midodrine and haldol in order to tolerate HD. Dialysis today -Nephrology following, appreciate assistance -IR replaced [facility] 8/14, appreciate assistance. -Patient has had trouble tolerating due to skin breakdown and TDC clotting. Haldol as needed for agitation -Wound care reconsulted for ongoing skin breakdown making patient intolerant of dialysis. No further recs -Midodrine 30 TID for hypotension  #Hospital Acquired Pneumonia #Bloodstream infection WBC count has resolved to normal limits at 7.6.  CXR showing pulmonary edema with new pleural effusions. Blood cultures on 8/15 positive for enterobacter cloacae. Unable to tolerate line holiday given needs for dialysis. On antibiotic therapy with cefepime, day 3.   #Hypervolemic hyponatremia #Hyperphosphatemia #Hyperkalemia #Hypocalcemia -Recurrent hyperkalemia. Treating as needed with lokelma, insulin/D50, albuterol, calcium gluconate -Monitor electrolytes with daily labs, supplement as necessary -Tube feeds d/c 8/12. Monitor blood glucose with frequent checks  #Anemia of chronic disease Monitor with daily labs, transfuse as needed  #Skin breakdown Perineal and sacral skin breakdown. Patient refuses rectal tube -Continue local wound care -Wound care reconsulted, no new  recs  #Lower Extremity Numbness  Pt states he has been having burning pain in his feet for the last two weeks. On exam, there is decreased sensation in the medial aspect of the right foot, but sensation is normal on the left foot. The numbness/tingling is consistently there, with no exacerbation or relieving factors. He states that he has tried gabapentin before but that did not help. He has good dorsalis pedis and posterior tibialis pulses +2.  I suspect this may be secondary to him not being able to move out of his bed. Last A1c four months ago was 5.6, less concern for diabetic neuropathy. Gabapentin is one of his home medications, will restart and re-examine tomorrow. Per pt notes, he has only been able to sit  up, and does not tolerate standing out of bed given his reduced range of motion.   Plan:  - Resume gabapentin  #Mixed Septic and Cardiogenic Shock -Resolved, transferred to floor from ICU on 8/9  Diet: Normal IVF: None,   VTE: DOAC Code: Full PT/OT recs:   Dispo: Anticipated discharge to Skilled nursing facility pending medical stability Olegario Messier, MD PGY-2 Internal Medicine Resident Pager Number [phone] Please contact the on call pager after 5 pm and on weekends at [phone].

## 2023-03-12 NOTE — Procedures (Signed)
HD Note:  Some information was entered later than the data was gathered due to patient care needs. The stated time with the data is accurate.  Received patient in bed to unit.   Alert and oriented.   Informed consent signed and in chart.   TX duration: 3.75 hours  Patient heart rhythm of A fib with rates in the 120-160 b/m consistently during treatment.  Transported back to the room   Alert, without acute distress.   Access used: Right upper chest HD catheter Access issues: None  Total UF removed:  Hand-off given to patient's nurse.   Geet Hosking L. Dareen Piano, RN [facility].

## 2023-03-12 NOTE — Progress Notes (Signed)
Palliative Care re-consultation received and chart reviewed for changes in status and goals of care. Patient is familiar to our service and multiple conversations held with patient and his mother, specifically earlier in hospitalization when it was felt that he would not survive, had issues with HD access and was not weaning of ventilator. He has and continues to express a desire for full scope aggressive medical interventions despite an overall poor prognosis for recovering independent functional status or the ability to survive outside of an acute care setting. He is now HD dependent, has ongoing multiorgan failure with an EF of 20%, bacteremia and pain/neuropathy from immobilization and critical illness. He names his mother as a Runner, broadcasting/film/video and desires her involvement in decision making. His goals align with LTACH placement. Recommend scheduled tylenol, gabapentin and low dose opioid prn for extremity pain and neuropathy. Palliative care available to support IMTS and support patient if he desires further conversation, but at this time care plan is established and goals likely to remain for whatever recovery or outcome is possible.  Anderson Malta, DO Palliative Medicine

## 2023-03-13 DIAGNOSIS — G9341 Metabolic encephalopathy: Secondary | ICD-10-CM | POA: Diagnosis not present

## 2023-03-13 DIAGNOSIS — J9621 Acute and chronic respiratory failure with hypoxia: Secondary | ICD-10-CM | POA: Diagnosis not present

## 2023-03-13 DIAGNOSIS — Z87891 Personal history of nicotine dependence: Secondary | ICD-10-CM | POA: Diagnosis not present

## 2023-03-13 DIAGNOSIS — J9622 Acute and chronic respiratory failure with hypercapnia: Secondary | ICD-10-CM | POA: Diagnosis not present

## 2023-03-13 LAB — COOXEMETRY PANEL
Carboxyhemoglobin: 2 % — ABNORMAL HIGH (ref 0.5–1.5)
Methemoglobin: <0.7 % (ref 0.0–1.5)
O2 Saturation: 60.9 %
Total hemoglobin: 9.3 g/dL — ABNORMAL LOW (ref 12.0–16.0)

## 2023-03-13 LAB — GLUCOSE, CAPILLARY
Glucose-Capillary: 104 mg/dL — ABNORMAL HIGH (ref 70–99)
Glucose-Capillary: 80 mg/dL (ref 70–99)
Glucose-Capillary: 85 mg/dL (ref 70–99)
Glucose-Capillary: 89 mg/dL (ref 70–99)
Glucose-Capillary: 94 mg/dL (ref 70–99)
Glucose-Capillary: 97 mg/dL (ref 70–99)

## 2023-03-13 LAB — BASIC METABOLIC PANEL
Anion gap: 19 — ABNORMAL HIGH (ref 5–15)
BUN: 46 mg/dL — ABNORMAL HIGH (ref 6–20)
CO2: 21 mmol/L — ABNORMAL LOW (ref 22–32)
Calcium: 10.5 mg/dL — ABNORMAL HIGH (ref 8.9–10.3)
Chloride: 90 mmol/L — ABNORMAL LOW (ref 98–111)
Creatinine, Ser: 4.69 mg/dL — ABNORMAL HIGH (ref 0.61–1.24)
GFR, Estimated: 15 mL/min — ABNORMAL LOW (ref >60)
Glucose, Bld: 97 mg/dL (ref 70–99)
Potassium: 4.6 mmol/L (ref 3.5–5.1)
Sodium: 130 mmol/L — ABNORMAL LOW (ref 135–145)

## 2023-03-13 LAB — CBC
HCT: 31.9 % — ABNORMAL LOW (ref 39.0–52.0)
Hemoglobin: 10.1 g/dL — ABNORMAL LOW (ref 13.0–17.0)
MCH: 32.4 pg (ref 26.0–34.0)
MCHC: 31.7 g/dL (ref 30.0–36.0)
MCV: 102.2 fL — ABNORMAL HIGH (ref 80.0–100.0)
Platelets: 198 10*3/uL (ref 150–400)
RBC: 3.12 MIL/uL — ABNORMAL LOW (ref 4.22–5.81)
RDW: 17.8 % — ABNORMAL HIGH (ref 11.5–15.5)
WBC: 7 10*3/uL (ref 4.0–10.5)
nRBC: 0 % (ref 0.0–0.2)

## 2023-03-13 MED ORDER — ALTEPLASE 2 MG IJ SOLR
2.0000 mg | Freq: Once | INTRAMUSCULAR | Status: AC
  Administered 2023-03-13: 2 mg
  Filled 2023-03-13: qty 2

## 2023-03-13 MED ORDER — ALTEPLASE 2 MG IJ SOLR
2.0000 mg | Freq: Once | INTRAMUSCULAR | Status: AC
  Filled 2023-03-13: qty 2

## 2023-03-13 MED ORDER — ALTEPLASE 2 MG IJ SOLR
2.0000 mg | Freq: Once | INTRAMUSCULAR | Status: AC
  Administered 2023-03-13: 2 mg
  Filled 2023-03-13 (×2): qty 2

## 2023-03-13 MED ORDER — SODIUM CHLORIDE 0.9% FLUSH
10.0000 mL | Freq: Two times a day (BID) | INTRAVENOUS | Status: DC
  Administered 2023-03-13 – 2023-03-18 (×8): 10 mL

## 2023-03-13 MED ORDER — GABAPENTIN 100 MG PO CAPS
100.0000 mg | ORAL_CAPSULE | Freq: Once | ORAL | Status: DC
  Filled 2023-03-13: qty 1

## 2023-03-13 NOTE — Progress Notes (Signed)
Pharmacy Antibiotic Note  Phillip Young is a 37 y.o. male admitted on 01/21/2023 and now found to have Enterobacter cloacae bacteremia. Pharmacy has been consulted for Cefepime dosing.  The patient is ESRD - usual schedule noted to be TTS. The patient is currently receiving HD and tolerating. Expect he might get off schedule if considering line holiday so will utilize daily dosing for now.   Plan: - Continue Cefepime 1g IV every 24 hours - Will continue to follow HD schedule/duration, culture results, LOT, and antibiotic de-escalation plans   Height: 6' (182.9 cm) Weight: (!) 178.6 kg (393 lb 11.9 oz) IBW/kg (Calculated) : 77.6  Temp (24hrs), Avg:98.3 F (36.8 C), Min:97.7 F (36.5 C), Max:98.7 F (37.1 C)  Recent Labs  Lab 03/09/23 0216 03/09/23 0546 03/09/23 0600 03/09/23 1322 03/10/23 0609 03/10/23 1430 03/11/23 0429 03/12/23 0528  WBC 18.7*  --  13.0*  --  12.0*  --  9.4 7.6  CREATININE 8.61*   < >  --  9.02* 5.78* 6.20* 6.77* 5.27*   < > = values in this interval not displayed.    Estimated Creatinine Clearance: 31.7 mL/min (A) (by C-G formula based on SCr of 5.27 mg/dL (H)).    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  Jerrilyn Cairo, PharmD 03/13/2023 11:04 AM   **Pharmacist phone directory can now be found on [email or web address] ([email or web address]).  Listed under [facility].

## 2023-03-13 NOTE — Progress Notes (Signed)
Subjective:   Summary: Phillip Young is a 37 y.o. year old male currently admitted on the IMTS HD#51 for Acute on chronic hypoxic respiratory failure, ESRD on HD.  Subjective:  Saw patient at bedside this morning. Patient states he is feeling very well and continues to ask if we feel that he is getting better. Told patient we are glad he is feeling well and to continue to work with physical therapy.  Patient mentioned he was interested in changing his code status to DNR, though his mother changed it back to full code.  Endorses continued foot pain.  Objective:  Vital signs in last 24 hours: Vitals:   03/13/23 0001 03/13/23 0407 03/13/23 0500 03/13/23 0759  BP: 108/68 (!) 160/128  91/71  Pulse:  (!) 117  (!) 128  Resp: 17 19  19   Temp: 98.2 F (36.8 C) 98.7 F (37.1 C)  97.7 F (36.5 C)  TempSrc: Oral Oral  Oral  SpO2: 98% 100%  100%  Weight:   (!) 178.6 kg   Height:       Supplemental O2: HFNC SpO2: 100 % O2 Flow Rate (L/min): 5 L/min FiO2 (%): 30 %   Physical Exam:  Constitutional: chronically ill appearing, laying in bed, in no acute distress.  Cardiovascular: Irregular rate and rhythm, no murmurs, rubs or gallops Pulmonary/Chest: Anterior lung exam showed decreased breath sounds bilaterally  Abdominal: soft, non-tender, non-distended Skin: warm and dry Extremities: upper/lower extremity pulses 2+, no lower extremity edema present, tenderness to palpation in bilateral lower extremities up to mid-shin.  Neuro: Decreased sensation in medial aspect of right foot, moves lower extremities spontaneously  Filed Weights   03/11/23 1214 03/12/23 0500 03/13/23 0500  Weight: (!) 180.6 kg (!) 178.4 kg (!) 178.6 kg     Intake/Output Summary (Last 24 hours) at 03/13/2023 0902 Last data filed at 03/13/2023 0630 Gross per 24 hour  Intake 623.19 ml  Output 3000 ml  Net -2376.81 ml   Net IO Since Admission: 4,497.08 mL [03/13/23 0902]  Pertinent Labs:     Latest Ref Rng & Units 03/12/2023    5:28 AM 03/11/2023    4:29 AM 03/10/2023    6:09 AM  CBC  WBC 4.0 - 10.5 K/uL 7.6  9.4  12.0   Hemoglobin 13.0 - 17.0 g/dL 8.7  8.0  8.0   Hematocrit 39.0 - 52.0 % 27.5  25.5  24.9   Platelets 150 - 400 K/uL 169  156  155        Latest Ref Rng & Units 03/12/2023    5:28 AM 03/11/2023    4:29 AM 03/10/2023    2:30 PM  CMP  Glucose 70 - 99 mg/dL 90  96  829   BUN 6 - 20 mg/dL 63  562  91   Creatinine 0.61 - 1.24 mg/dL 1.30  8.65  7.84   Sodium 135 - 145 mmol/L 129  129  132   Potassium 3.5 - 5.1 mmol/L 4.3  4.1  4.0   Chloride 98 - 111 mmol/L 91  91  89   CO2 22 - 32 mmol/L 23  19  25    Calcium 8.9 - 10.3 mg/dL 9.8  9.2  9.2   Total Protein 6.5 - 8.1 g/dL 6.4     Total Bilirubin 0.3 - 1.2 mg/dL 1.0     Alkaline Phos 38 - 126 U/L 363  AST 15 - 41 U/L 56     ALT 0 - 44 U/L 15       Imaging: No results found.   Assessment/Plan:   Principal Problem:   Acute hypoxic respiratory failure ([name]) Active Problems:   Morbid obesity ([name])   Chronic systolic CHF (congestive heart failure) ([name])   Cardiogenic shock ([name])   ESRD (end stage renal disease) ([name])   ESRD (end stage renal disease) on dialysis ([name])   ESRD on dialysis ([name])   Hypervolemia   Sepsis with acute organ dysfunction without septic shock ([name])   Atrial fibrillation ([name])   Pressure injury of skin   Central line infection, subsequent encounter   Patient Summary: Phillip Young is a 37 y.o. with a pertinent PMH of chronic hypoxic respiratory failure, ESRD, HFrEF with EF 20-25%, refractory Afib, amiodarone induced lung toxicity, who presented with fevers, joint pains, SOB and admitted for sepsis, now resolved.   #Acute on chronic hypoxemic/hypercapnic respiratory failure #Acute metabolic encephalopathy in setting of hypercapnia Patient was transferred from ICU on 8/9 after extubation and pressors were discontinued. Mental status has improved, patient is currently alert and  following commands.  -Currently on 6L HFNC with O2 sats in mid 90s -Noncompliant with CPAP at home, nurses note patient has been refusing BiPAP at night -Encourage incentive spirometry and PT/OT  #Acute on chronic HFrEF with pulmonary edema #Paroxysmal Afib/flutter with RVR #Nonischemic cardiomyopathy EF less than 20% -Remains in Afib w, HR ranging up to 150s -Patient intolerant of metoprolol and carvedilol due to allergy, amiodarone due to lung toxicity, diltiazem due to extremely reduced EF -Cardiology signed off, however recommending digoxin every other day (non-dialysis days) and frequent digoxin levels. Appreciate recs -Continue Eliquis for stroke prophylaxis -Palliative team consulted, planning family meeting soon  #ESRD on HD T/Th/S #Recurrent metabolic acidosis, improving S/P TDC repair on 8/14, has been receiving dialysis. Per notation, patient has consistently required midodrine and haldol in order to tolerate HD. Dialysis today -Nephrology following, appreciate assistance -IR replaced [facility] 8/14, appreciate assistance. -Patient has had trouble tolerating due to skin breakdown and TDC clotting. Haldol as needed for agitation -Wound care reconsulted for ongoing skin breakdown making patient intolerant of dialysis. No further recs -Midodrine 30 TID for hypotension  #Bacteremia Blood cultures on 8/15 positive for enterobacter cloacae. WBC count has resolved to normal limits at 7.6. CXR showing pulmonary edema with new pleural effusions.  Unable to tolerate line holiday given needs for dialysis.  -On cefepime  #Hypervolemic hyponatremia #Hyperphosphatemia #Hyperkalemia #Hypocalcemia -Recurrent hyperkalemia. Treating as needed with lokelma, insulin/D50, albuterol, calcium gluconate -Monitor electrolytes with daily labs, supplement as necessary -Tube feeds d/c 8/12. Monitor blood glucose with frequent checks  #Anemia of chronic disease Monitor with daily labs, transfuse as  needed  #Skin breakdown Perineal and sacral skin breakdown. Patient refuses rectal tube -Continue local wound care -Wound care reconsulted, no new recs  #Lower Extremity Numbness  Pt states he has been having burning pain in his feet for the last two weeks. On exam, there is decreased sensation in the medial aspect of the right foot, but sensation is normal on the left foot. The numbness/tingling is consistently there, with no exacerbation or relieving factors. He states that he has tried gabapentin before but that did not help. He has good dorsalis pedis and posterior tibialis pulses +2.  I suspect this may be secondary to him not being able to move out of his bed. Last A1c four months ago was 5.6, less  concern for diabetic neuropathy. Gabapentin is one of his home medications, will restart and re-examine tomorrow. Per pt notes, he has only been able to sit up, and does not tolerate standing out of bed given his reduced range of motion.  - Resume gabapentin  #Mixed Septic and Cardiogenic Shock -Resolved, transferred to floor from ICU on 8/9  Diet: Normal IVF: None,   VTE: DOAC Code: Full PT/OT recs:   Dispo: Anticipated discharge to Skilled nursing facility pending medical stability Monna Fam, MD PGY-1 Internal Medicine Resident Pager Number [phone] Please contact the on call pager after 5 pm and on weekends at [phone].

## 2023-03-13 NOTE — TOC Progression Note (Signed)
Transition of Care [facility]) - Progression Note    Patient Details  Name: Phillip Young MRN: [id number] Date of Birth: [birth year]  Transition of Care [facility]) CM/SW Contact  Ronny Bacon, RN Phone Number: 03/13/2023, 11:26 AM  Clinical Narrative:  Awaiting select LTAC eval and recommendations. Referral sent through [facility].    Expected Discharge Plan: Home/Self Care Barriers to Discharge: Continued Medical Work up  Expected Discharge Plan and Services   Discharge Planning Services: CM Consult   Living arrangements for the past 2 months: Single Family Home                                       Social Determinants of Health (SDOH) Interventions SDOH Screenings   Food Insecurity: No Food Insecurity (01/22/2023)  Housing: Low Risk  (01/22/2023)  Transportation Needs: No Transportation Needs (01/22/2023)  Utilities: Not At Risk (01/22/2023)  Alcohol Screen: Low Risk  (09/21/2022)  Depression (PHQ2-9): High Risk (09/21/2022)  Financial Resource Strain: High Risk (09/21/2022)  Physical Activity: Inactive (09/21/2022)  Social Connections: Unknown (09/21/2022)  Stress: No Stress Concern Present (09/21/2022)  Tobacco Use: Medium Risk (01/21/2023)    Readmission Risk Interventions    06/09/2022   12:19 PM 05/12/2021   10:35 AM 10/16/2020    2:10 PM  Readmission Risk Prevention Plan  Transportation Screening Complete Complete Complete  PCP or Specialist Appt within 5-7 Days   Complete  Home Care Screening   Complete  Medication Review (RN CM)   Referral to Pharmacy  Medication Review (RN Care Manager) Complete Complete   PCP or Specialist appointment within 3-5 days of discharge Complete Complete   HRI or Home Care Consult Complete Complete   SW Recovery Care/Counseling Consult Complete Complete   Palliative Care Screening Not Applicable Complete   Skilled Nursing Facility Not Applicable Complete

## 2023-03-13 NOTE — Progress Notes (Signed)
At 0547, paged and notified IMTS of patient's c/o pain, unrelieved with oxycodone. Patient was given oxycodone 10 mg for pain 10/10 in back and left leg at 0427. About one hour and 20 minutes post pain medication, patient continues to c/o pain 10/10. Per IMTS, they will evaluate patient's medications, no new orders. Will continue to monitor.

## 2023-03-13 NOTE — Progress Notes (Addendum)
[facility] Progress Note   Assessment/Plan: # Acute/ chronic hypotension - came off pressors 8/03, cont midodrine 30 tid.    # Pneumococcal bacteremia/Enterobacter cloacae bacteremia: admitting diagnosis w/ septic shock. Complex course. Blood cultures from 8/13 (+) Enterobacter cloacae. Now on IV Cefepime. Line holiday suggested, however, given urgent dialysis needs, and past difficulty with cathter function line holiday deferred until volume status optimized.    # Acute hypoxic and hypercapnic respiratory failure: extubated on 7/20.  Stable on lower dose of O2 at 3-6 lpm HFNC to [place].    # Volume - mild LE edema, wt's down 178 . Need to keep his volume under control, try to keep him 175- 178kg. Pull 3 L UF 3x per week unless signs of vol depletion.    # ESRD - usual HD is TTS. Required emergent HD on 6/29 because of fluid overload.  SP CRRT 6/29 -8/07. Back on iHD TTS. Needs to be positioned in bed properly right before going to dialysis. Missed HD 8/13 2nd TDC malfunction.  Received HD 8/14 (2L removed). Had HD 8/16, 8/17. Back on schedule. Assess need for extra HD on Monday, considering need for line holiday.    # Agitation - while on dialysis, related to decubitus ulcers and associated pain.    # HD access - 8/2 required temp HD catheter placement after [facility] malfunctioned. S/p TDC IR 8/14-TDC appears to be functioning well    # Anemia of CKD:  on max ESA with variable Hbs, generally in the 9s.    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 Have resumed IV hectorol (half dose) since Ca++ levels are back to normal.    # Acute on chronic systolic CHF, nonischemic: EF 91-47%. Per Cardiology, not a candidate for AV node ablation, ICD, or pacemake d/t infection risk. Also not a candidate for advanced heart failure therapies.   #A-fib with RVR with hypotension: S/P cardioversion 7/5 -> NSR -> afib RVR 7/6 early AM refractory to [facility] x3 + amiodarone. Persistent afib/flutter w/ HR > 100  #  Prognosis - with uncontrolled afib, pressure sores, bacteremia, and other comorbidities prognosis is poor.  Dialysis Orders: [facility] TTS  4h   180kg  425/1.5  TDC  Na citrate cath lock (no heparin) - last OP HD was 6/25, post wt 193.8kg  Tomasa Blase PA-C  [facility] 03/13/2023,9:51 AM  Pt seen, examined and agree w assess/plan as above with additions as indicated.  Rob [facility] 03/13/2023, 12:47 PM   Subjective:    Seen in room. Completed dialysis yesterday with 3L removed. A little more comfortable today. Asking for breakfast.   Objective Vitals:   03/13/23 0407 03/13/23 0500 03/13/23 0759 03/13/23 0900  BP: (!) 160/128  91/71   Pulse: (!) 117  (!) 128   Resp: 19  19   Temp: 98.7 F (37.1 C)  97.7 F (36.5 C)   TempSrc: Oral  Oral   SpO2: 100%  100% 100%  Weight:  (!) 178.6 kg    Height:       Physical Exam General: chronically ill appearing, on nasal oxygen   Heart: Irregular  Lungs: Clear bilaterally  Abdomen: obese, soft  Extremities: Trace LE edema Dialysis Access: [facility]   Filed Weights   03/11/23 1214 03/12/23 0500 03/13/23 0500  Weight: (!) 180.6 kg (!) 178.4 kg (!) 178.6 kg    Intake/Output Summary (Last 24 hours) at 03/13/2023 0951 Last data filed at 03/13/2023 0630  Gross per 24 hour  Intake 623.19 ml  Output 3000 ml  Net -2376.81 ml    Additional Objective Labs: Basic Metabolic Panel: Recent Labs  Lab 03/09/23 0546 03/09/23 1201 03/09/23 1322 03/10/23 0609 03/10/23 1430 03/11/23 0429 03/12/23 0528  NA 131*  --  130*   < > 132* 129* 129*  K 5.7*   < > 5.0   < > 4.0 4.1 4.3  CL 90*  --  89*   < > 89* 91* 91*  CO2 18*  --  16*   < > 25 19* 23  GLUCOSE 102*  --  117*   < > 104* 96 90  BUN 154*  --  158*   < > 91* 103* 63*  CREATININE 8.70*  --  9.02*   < > 6.20* 6.77* 5.27*  CALCIUM 9.7  --  9.2   < > 9.2 9.2 9.8  PHOS 5.4*  --  5.3*  --   --  4.0  --    < > = values in this interval not  displayed.   Liver Function Tests: Recent Labs  Lab 03/08/23 0430 03/09/23 0546 03/10/23 0609 03/11/23 0429 03/12/23 0528  AST 55*  --  60*  --  56*  ALT 49*  --  30  --  15  ALKPHOS 358*  --  393*  --  363*  BILITOT 0.8  --  1.0  --  1.0  PROT 7.1  --  6.6  --  6.4*  ALBUMIN 2.8*   < > 2.5* 2.4* 2.5*   < > = values in this interval not displayed.   No results for input(s): "LIPASE", "AMYLASE" in the last 168 hours. CBC: Recent Labs  Lab 03/08/23 2008 03/09/23 0216 03/09/23 0600 03/10/23 0609 03/11/23 0429 03/12/23 0528  WBC 9.7 18.7* 13.0* 12.0* 9.4 7.6  NEUTROABS 9.4*  --  12.5*  --   --   --   HGB 8.6* 8.3* 8.3* 8.0* 8.0* 8.7*  HCT 26.6* 26.4* 26.0* 24.9* 25.5* 27.5*  MCV 104.3* 104.3* 103.6* 105.5* 106.3* 102.2*  PLT 245 254 231 155 156 169   Blood Culture    Component Value Date/Time   SDES BLOOD RIGHT HAND 03/11/2023 0718   SPECREQUEST  03/11/2023 0718    BOTTLES DRAWN AEROBIC AND ANAEROBIC Blood Culture adequate volume   CULT  03/11/2023 0718    NO GROWTH 2 DAYS Performed at [facility], [address] [place], [place] [address]    REPTSTATUS PENDING 03/11/2023 3086    Cardiac Enzymes: No results for input(s): "CKTOTAL", "CKMB", "CKMBINDEX", "TROPONINI" in the last 168 hours. CBG: Recent Labs  Lab 03/12/23 1636 03/12/23 2005 03/13/23 0000 03/13/23 0407 03/13/23 0748  GLUCAP 152* 106* 94 85 80   Iron Studies: No results for input(s): "IRON", "TIBC", "TRANSFERRIN", "FERRITIN" in the last 72 hours. Lab Results  Component Value Date   INR 2.0 (H) 06/10/2022   INR 2.8 (H) 06/09/2022   INR 2.9 (H) 06/07/2022   Studies/Results: No results found.  Medications:  sodium chloride 10 mL/hr at 02/21/23 1444   ceFEPime (MAXIPIME) IV Stopped (03/12/23 2300)    alteplase  2 mg Intracatheter Once   alteplase  2 mg Intracatheter Once   apixaban  5 mg Oral BID   Chlorhexidine Gluconate Cloth  6 each Topical Q0600   cholecalciferol  1,000 Units  Oral Daily   darbepoetin (ARANESP) injection - DIALYSIS  200 mcg Subcutaneous Q Sat-1800   digoxin  0.125 mg Oral Once per day on Monday Wednesday Friday   doxercalciferol  2 mcg Intravenous Q T,Th,Sa-HD   famotidine  20 mg Oral Daily   gabapentin  100 mg Oral TID   gabapentin  100 mg Oral Once   lidocaine  1 patch Transdermal Q24H   melatonin  5 mg Oral QHS   midodrine  30 mg Oral TID WC   multivitamin  1 tablet Oral QHS   mouth rinse  15 mL Mouth Rinse 4 times per day   senna-docusate  1 tablet Oral QHS

## 2023-03-14 DIAGNOSIS — J9622 Acute and chronic respiratory failure with hypercapnia: Secondary | ICD-10-CM | POA: Diagnosis not present

## 2023-03-14 DIAGNOSIS — J9621 Acute and chronic respiratory failure with hypoxia: Secondary | ICD-10-CM | POA: Diagnosis not present

## 2023-03-14 DIAGNOSIS — I480 Paroxysmal atrial fibrillation: Secondary | ICD-10-CM | POA: Diagnosis not present

## 2023-03-14 DIAGNOSIS — I5023 Acute on chronic systolic (congestive) heart failure: Secondary | ICD-10-CM | POA: Diagnosis not present

## 2023-03-14 LAB — GLUCOSE, CAPILLARY
Glucose-Capillary: 101 mg/dL — ABNORMAL HIGH (ref 70–99)
Glucose-Capillary: 101 mg/dL — ABNORMAL HIGH (ref 70–99)
Glucose-Capillary: 115 mg/dL — ABNORMAL HIGH (ref 70–99)
Glucose-Capillary: 129 mg/dL — ABNORMAL HIGH (ref 70–99)
Glucose-Capillary: 131 mg/dL — ABNORMAL HIGH (ref 70–99)
Glucose-Capillary: 132 mg/dL — ABNORMAL HIGH (ref 70–99)
Glucose-Capillary: 139 mg/dL — ABNORMAL HIGH (ref 70–99)

## 2023-03-14 LAB — COMPREHENSIVE METABOLIC PANEL
ALT: 14 U/L (ref 0–44)
AST: 33 U/L (ref 15–41)
Albumin: 2.7 g/dL — ABNORMAL LOW (ref 3.5–5.0)
Alkaline Phosphatase: 332 U/L — ABNORMAL HIGH (ref 38–126)
Anion gap: 16 — ABNORMAL HIGH (ref 5–15)
BUN: 57 mg/dL — ABNORMAL HIGH (ref 6–20)
CO2: 23 mmol/L (ref 22–32)
Calcium: 10.1 mg/dL (ref 8.9–10.3)
Chloride: 88 mmol/L — ABNORMAL LOW (ref 98–111)
Creatinine, Ser: 6.1 mg/dL — ABNORMAL HIGH (ref 0.61–1.24)
GFR, Estimated: 11 mL/min — ABNORMAL LOW (ref >60)
Glucose, Bld: 103 mg/dL — ABNORMAL HIGH (ref 70–99)
Potassium: 4.4 mmol/L (ref 3.5–5.1)
Sodium: 127 mmol/L — ABNORMAL LOW (ref 135–145)
Total Bilirubin: 0.6 mg/dL (ref 0.3–1.2)
Total Protein: 6.8 g/dL (ref 6.5–8.1)

## 2023-03-14 LAB — CBC
HCT: 28.2 % — ABNORMAL LOW (ref 39.0–52.0)
Hemoglobin: 9.2 g/dL — ABNORMAL LOW (ref 13.0–17.0)
MCH: 33.7 pg (ref 26.0–34.0)
MCHC: 32.6 g/dL (ref 30.0–36.0)
MCV: 103.3 fL — ABNORMAL HIGH (ref 80.0–100.0)
Platelets: 201 10*3/uL (ref 150–400)
RBC: 2.73 MIL/uL — ABNORMAL LOW (ref 4.22–5.81)
RDW: 17.6 % — ABNORMAL HIGH (ref 11.5–15.5)
WBC: 7.8 10*3/uL (ref 4.0–10.5)
nRBC: 0 % (ref 0.0–0.2)

## 2023-03-14 LAB — COOXEMETRY PANEL
Carboxyhemoglobin: 1.1 % (ref 0.5–1.5)
Methemoglobin: <0.7 % (ref 0.0–1.5)
O2 Saturation: 61.4 %
Total hemoglobin: 9.2 g/dL — ABNORMAL LOW (ref 12.0–16.0)

## 2023-03-14 MED ORDER — PROSOURCE PLUS PO LIQD
30.0000 mL | Freq: Two times a day (BID) | ORAL | Status: DC
  Administered 2023-03-14 – 2023-03-23 (×8): 30 mL via ORAL
  Filled 2023-03-14 (×15): qty 30

## 2023-03-14 MED ORDER — FAMOTIDINE 20 MG PO TABS
10.0000 mg | ORAL_TABLET | Freq: Every day | ORAL | Status: DC
  Administered 2023-03-15 – 2023-03-23 (×9): 10 mg via ORAL
  Filled 2023-03-14 (×9): qty 1

## 2023-03-14 MED ORDER — LEVOFLOXACIN 750 MG PO TABS
750.0000 mg | ORAL_TABLET | Freq: Once | ORAL | Status: AC
  Administered 2023-03-15: 750 mg via ORAL
  Filled 2023-03-14 (×2): qty 1

## 2023-03-14 MED ORDER — LEVOFLOXACIN 500 MG PO TABS
500.0000 mg | ORAL_TABLET | ORAL | Status: DC
  Administered 2023-03-15: 500 mg via ORAL
  Filled 2023-03-14: qty 1

## 2023-03-14 MED ORDER — NEPRO/CARBSTEADY PO LIQD: 237.0000 mL | Freq: Two times a day (BID) | ORAL | Status: DC

## 2023-03-14 NOTE — Progress Notes (Addendum)
[facility] Progress Note   Subjective:   Seen in room - feels ok for now, remain on high flow nasal O2. Dialyzed last on 8/17 - plan is for HD tomorrow.  Objective Vitals:   03/14/23 0315 03/14/23 0500 03/14/23 0805 03/14/23 0955  BP: (!) 121/101  (!) 91/56   Pulse: 99  (!) 111 (!) 124  Resp: 19  13   Temp: 98 F (36.7 C)  (!) 97.5 F (36.4 C)   TempSrc: Oral  Oral   SpO2: 95%     Weight:  (!) 182.4 kg    Height:       Physical Exam General: Obese man, NAD, Nasal O2 5L/min Heart: RRR; no murmur Lungs: CTA laterally, unable to sit up for posterior exam Abdomen: obese, non-tender Extremities: no LE edema Dialysis Access: [facility] in R chest  Additional Objective Labs: Basic Metabolic Panel: Recent Labs  Lab 03/09/23 0546 03/09/23 1201 03/09/23 1322 03/10/23 0609 03/11/23 0429 03/12/23 0528 03/13/23 1321 03/14/23 0548  NA 131*  --  130*   < > 129* 129* 130* 127*  K 5.7*   < > 5.0   < > 4.1 4.3 4.6 4.4  CL 90*  --  89*   < > 91* 91* 90* 88*  CO2 18*  --  16*   < > 19* 23 21* 23  GLUCOSE 102*  --  117*   < > 96 90 97 103*  BUN 154*  --  158*   < > 103* 63* 46* 57*  CREATININE 8.70*  --  9.02*   < > 6.77* 5.27* 4.69* 6.10*  CALCIUM 9.7  --  9.2   < > 9.2 9.8 10.5* 10.1  PHOS 5.4*  --  5.3*  --  4.0  --   --   --    < > = values in this interval not displayed.   Liver Function Tests: Recent Labs  Lab 03/10/23 0609 03/11/23 0429 03/12/23 0528 03/14/23 0548  AST 60*  --  56* 33  ALT 30  --  15 14  ALKPHOS 393*  --  363* 332*  BILITOT 1.0  --  1.0 0.6  PROT 6.6  --  6.4* 6.8  ALBUMIN 2.5* 2.4* 2.5* 2.7*   CBC: Recent Labs  Lab 03/08/23 2008 03/09/23 0216 03/09/23 0600 03/10/23 0609 03/11/23 0429 03/12/23 0528 03/13/23 1321 03/14/23 0548  WBC 9.7   < > 13.0* 12.0* 9.4 7.6 7.0 7.8  NEUTROABS 9.4*  --  12.5*  --   --   --   --   --   HGB 8.6*   < > 8.3* 8.0* 8.0* 8.7* 10.1* 9.2*  HCT 26.6*   < > 26.0* 24.9* 25.5* 27.5* 31.9* 28.2*  MCV  104.3*   < > 103.6* 105.5* 106.3* 102.2* 102.2* 103.3*  PLT 245   < > 231 155 156 169 198 201   < > = values in this interval not displayed.   Blood Culture    Component Value Date/Time   SDES BLOOD RIGHT HAND 03/11/2023 0718   SPECREQUEST  03/11/2023 0718    BOTTLES DRAWN AEROBIC AND ANAEROBIC Blood Culture adequate volume   CULT  03/11/2023 0718    NO GROWTH 3 DAYS Performed at [facility], [address], [place], [place] [address]    REPTSTATUS PENDING 03/11/2023 6045   Medications:  sodium chloride 10 mL/hr at 02/21/23 1444   ceFEPime (MAXIPIME) IV 1 g (03/13/23 2337)  apixaban  5 mg Oral BID   Chlorhexidine Gluconate Cloth  6 each Topical Q0600   cholecalciferol  1,000 Units Oral Daily   darbepoetin (ARANESP) injection - DIALYSIS  200 mcg Subcutaneous Q Sat-1800   digoxin  0.125 mg Oral Once per day on Monday Wednesday Friday   doxercalciferol  2 mcg Intravenous Q T,Th,Sa-HD   famotidine  20 mg Oral Daily   feeding supplement (NEPRO CARB STEADY)  237 mL Oral BID BM   gabapentin  100 mg Oral TID   gabapentin  100 mg Oral Once   lidocaine  1 patch Transdermal Q24H   melatonin  5 mg Oral QHS   midodrine  30 mg Oral TID WC   multivitamin  1 tablet Oral QHS   mouth rinse  15 mL Mouth Rinse 4 times per day   senna-docusate  1 tablet Oral QHS   sodium chloride flush  10-40 mL Intracatheter Q12H    Dialysis Orders: [facility]  on TTS schedule  4.25hr, EDW 180kg  425/1.5  TDC, no heparin (HIT +), lock with Na Citrate    Assessment/Plan: Acute on chronic hypotension: Able to wean off pressors on 8/3, on high dose midodrine 30mg  TID. Acute on chronic resp failure: Last extubated 7/20, has intermittently required BiPAP, now on 3-6L/min O2. A-fib with RVR: Refractory to DCCV (3 attempts) and amiodarone. Persistent A-fib/flutter - HR > 100. On Eliquis. Acute on chronic HFrEF: EF 20-25%. Per cardiology, not a candidate for nodal ablation, ICD, or pacemaker. Pneumococcal  bacteremia/Enterobacter cloacae bacteremia. Admitted in septic shock, complex course. Last Blood cx + on 8/13 - plan was for line holiday but felt risky due to his resp status - instead just exchanged over wire on 8/14. ESRD: Usual TTS schedule. S/p CRRT 6/29-8/7. Gets extra HD at times - s/p HD 8/14, 8/16, 8/17. Next HD planned for tomorrow - 8/20. Increasing HD time and UF goal as tolerated.  Volume: Remains volume up with hyponatremia: Lowest here is 178kg - trying to keep him there. Anemia of ESRD: Hgb 9.2 - continue Aranesp q Sat (max). Secondary HPTH: CorrCa high, Phos ok - no binder at this time, hold VDRA. Nutrition: Alb low, continue supplements. Decubitus ulcerations: Occ restlessness while on HD. Prognosis: Poor. Considering [facility].  Ozzie Hoyle, PA-C 03/14/2023, 12:05 PM  [facility]

## 2023-03-14 NOTE — Telephone Encounter (Signed)
Traci patient is currently admitted since 01/21/23  [facility] Coordination Care Guide  Direct Dial: [phone]

## 2023-03-14 NOTE — Progress Notes (Addendum)
Subjective:   Summary: Phillip Young is a 37 y.o. year old male currently admitted on the IMTS HD#52 for Acute on chronic hypoxic respiratory failure, ESRD on HD.  Subjective:  Saw patient at bedside this morning. Informed patient about our efforts to get him transitioned to [facility] unit and how that may be more appropriate for his long-term care. Patient continues to endorse mild foot numbness. No new shortness of breath or pain.   Nurse notes patient is refusing Eliquis due to reported burning with bowel movements. Nursing attempted to educate with no effect.   Objective:  Vital signs in last 24 hours: Vitals:   03/14/23 0300 03/14/23 0315 03/14/23 0500 03/14/23 0805  BP: 111/76 (!) 121/101  (!) 91/56  Pulse: 86 99  (!) 111  Resp:  19  13  Temp:  98 F (36.7 C)  (!) 97.5 F (36.4 C)  TempSrc:  Oral  Oral  SpO2: 95% 95%    Weight:   (!) 182.4 kg   Height:       Supplemental O2: HFNC SpO2: 95 % O2 Flow Rate (L/min): 6 L/min FiO2 (%): 30 %   Physical Exam:  Constitutional: chronically ill appearing, laying in bed, in no acute distress.  Cardiovascular: Irregular rate and rhythm, no murmurs, rubs or gallops Pulmonary/Chest: Anterior lung exam showed decreased breath sounds bilaterally  Abdominal: soft, non-tender, non-distended Skin: warm and dry Extremities: upper/lower extremity pulses 2+, no lower extremity edema present, tenderness to palpation in bilateral lower extremities up to mid-shin.  Neuro: Decreased sensation in medial aspect of right foot, moves lower extremities spontaneously  Filed Weights   03/12/23 0500 03/13/23 0500 03/14/23 0500  Weight: (!) 178.4 kg (!) 178.6 kg (!) 182.4 kg     Intake/Output Summary (Last 24 hours) at 03/14/2023 0901 Last data filed at 03/14/2023 0600 Gross per 24 hour  Intake 1697 ml  Output --  Net 1697 ml   Net IO Since Admission: 6,434.08 mL [03/14/23 0901]  Pertinent Labs:    Latest Ref Rng & Units  03/14/2023    5:48 AM 03/13/2023    1:21 PM 03/12/2023    5:28 AM  CBC  WBC 4.0 - 10.5 K/uL 7.8  7.0  7.6   Hemoglobin 13.0 - 17.0 g/dL 9.2  51.8  8.7   Hematocrit 39.0 - 52.0 % 28.2  31.9  27.5   Platelets 150 - 400 K/uL 201  198  169        Latest Ref Rng & Units 03/14/2023    5:48 AM 03/13/2023    1:21 PM 03/12/2023    5:28 AM  CMP  Glucose 70 - 99 mg/dL 841  97  90   BUN 6 - 20 mg/dL 57  46  63   Creatinine 0.61 - 1.24 mg/dL 6.60  6.30  1.60   Sodium 135 - 145 mmol/L 127  130  129   Potassium 3.5 - 5.1 mmol/L 4.4  4.6  4.3   Chloride 98 - 111 mmol/L 88  90  91   CO2 22 - 32 mmol/L 23  21  23    Calcium 8.9 - 10.3 mg/dL 10.9  32.3  9.8   Total Protein 6.5 - 8.1 g/dL 6.8   6.4   Total Bilirubin 0.3 - 1.2 mg/dL 0.6   1.0   Alkaline Phos 38 - 126 U/L 332   363  AST 15 - 41 U/L 33   56   ALT 0 - 44 U/L 14   15     Imaging: No results found.   Assessment/Plan:   Principal Problem:   Acute hypoxic respiratory failure ([name]) Active Problems:   Morbid obesity ([name])   Chronic systolic CHF (congestive heart failure) ([name])   Cardiogenic shock ([name])   ESRD (end stage renal disease) ([name])   ESRD (end stage renal disease) on dialysis ([name])   ESRD on dialysis ([name])   Hypervolemia   Sepsis with acute organ dysfunction without septic shock ([name])   Atrial fibrillation ([name])   Pressure injury of skin   Central line infection, subsequent encounter   Patient Summary: Phillip Young is a 37 y.o. with a pertinent PMH of chronic hypoxic respiratory failure, ESRD, HFrEF with EF 20-25%, refractory Afib, amiodarone induced lung toxicity, who presented with fevers, joint pains, SOB and admitted for sepsis, now resolved.   #Acute on chronic HFrEF with pulmonary edema #Paroxysmal Afib/flutter with RVR #Nonischemic cardiomyopathy EF less than 20% -Remains in Afib with HR ranging up to 150s -Patient intolerant of metoprolol and carvedilol due to allergy, amiodarone due to lung toxicity,  diltiazem due to extremely reduced EF -Cardiology signed off, however recommending digoxin every other day (non-dialysis days) and frequent digoxin levels. Appreciate recs -Continue Eliquis for stroke prophylaxis -Palliative consulted, no new recs from previous consultation. Appreciate assistance -TOC consulted for [facility] placement, pending authorization  #ESRD on HD T/Th/S #Recurrent metabolic acidosis, improving S/P TDC repair on 8/14, has been receiving dialysis. Per notation, patient has consistently required midodrine and haldol in order to tolerate HD. Dialysis today -Nephrology following, appreciate assistance -IR replaced [facility] 8/14, appreciate assistance. -Patient has had trouble tolerating due to skin breakdown and TDC clotting. Haldol as needed for agitation -Wound care reconsulted for ongoing skin breakdown making patient intolerant of dialysis. No further recs -Midodrine 30 TID for hypotension  #Acute on chronic hypoxemic/hypercapnic respiratory failure #Acute metabolic encephalopathy in setting of hypercapnia Patient was transferred from ICU on 8/9 after extubation and pressors were discontinued. Mental status has improved, patient is currently alert and following commands. Patient is questionably adherent with home CPAP -6L HFNC with O2 sats in mid 90s -Encourage incentive spirometry and PT/OT  #Bacteremia Blood cultures on 8/15 positive for enterobacter cloacae. WBC count has resolved to normal limits at 7.6. CXR showing pulmonary edema with new pleural effusions.  Unable to tolerate line holiday given needs for dialysis.  -Blood culture sensitivities reported, on day 4 of cefepime  #Hypervolemic hyponatremia #Hyperphosphatemia #Hyperkalemia -Recurrent hyperkalemia. Treating as needed with lokelma, insulin/D50, albuterol, calcium gluconate -Monitor electrolytes with daily labs, supplement as necessary -Tube feeds d/c 8/12. Monitor blood glucose with frequent  checks  #Anemia of chronic disease Monitor with daily labs, transfuse as needed  #Skin breakdown Perineal and sacral skin breakdown. Patient refuses rectal tube -Continue local wound care -Wound care reconsulted, no new recs  #Lower Extremity Numbness  Pt states he has been having burning pain in his feet for the last two weeks. On exam, there is decreased sensation in the medial aspect of the right foot, but sensation is normal on the left foot. Last A1c four months ago was 5.6, less concern for diabetic neuropathy.  - Resume home gabapentin  #Mixed Septic and Cardiogenic Shock -Resolved, transferred to floor from ICU on 8/9  Diet: Normal IVF: None,   VTE: DOAC Code: Full PT/OT recs:   Dispo: Anticipated discharge to Skilled nursing  facility pending medical stability Monna Fam, MD PGY-1 Internal Medicine Resident Pager Number [phone] Please contact the on call pager after 5 pm and on weekends at [phone].

## 2023-03-14 NOTE — TOC Progression Note (Signed)
Transition of Care [facility]) - Progression Note    Patient Details  Name: Phillip Young MRN: [id number] Date of Birth: 11-09-1985  Transition of Care [facility]) CM/SW Contact  Elliot Cousin, RN Phone Number: [phone] 03/14/2023, 8:32 AM  Clinical Narrative:   CM received notification from [facility] rep, Latoya they will begin the auth process for LTAC.     Expected Discharge Plan: Home/Self Care Barriers to Discharge: Continued Medical Work up  Expected Discharge Plan and Services   Discharge Planning Services: CM Consult   Living arrangements for the past 2 months: Single Family Home                                       Social Determinants of Health (SDOH) Interventions SDOH Screenings   Food Insecurity: No Food Insecurity (01/22/2023)  Housing: Low Risk  (01/22/2023)  Transportation Needs: No Transportation Needs (01/22/2023)  Utilities: Not At Risk (01/22/2023)  Alcohol Screen: Low Risk  (09/21/2022)  Depression (PHQ2-9): High Risk (09/21/2022)  Financial Resource Strain: High Risk (09/21/2022)  Physical Activity: Inactive (09/21/2022)  Social Connections: Unknown (09/21/2022)  Stress: No Stress Concern Present (09/21/2022)  Tobacco Use: Medium Risk (01/21/2023)    Readmission Risk Interventions    06/09/2022   12:19 PM 05/12/2021   10:35 AM 10/16/2020    2:10 PM  Readmission Risk Prevention Plan  Transportation Screening Complete Complete Complete  PCP or Specialist Appt within 5-7 Days   Complete  Home Care Screening   Complete  Medication Review (RN CM)   Referral to Pharmacy  Medication Review (RN Care Manager) Complete Complete   PCP or Specialist appointment within 3-5 days of discharge Complete Complete   HRI or Home Care Consult Complete Complete   SW Recovery Care/Counseling Consult Complete Complete   Palliative Care Screening Not Applicable Complete   Skilled Nursing Facility Not Applicable Complete

## 2023-03-14 NOTE — Progress Notes (Signed)
Nutrition Brief Note  Visited patient at bedside who reports the food has been good and that his po intake has improved significantly now that he can have solid food.   2 Ensure Plus High Protein observed at bedside. Nepro would be more appropriate for patient at this time due to renal function and diabetes if he requires ONS at all. Patient was agreeable before falling asleep again.   After discussion with PA, it was decided that Prosource plus would be ideal for him in efforts to reduce volume as much as possible.   Labs: CBG 101, Cr 6.1, BUN 57 Meds: reviewed   Wt:  reviewed, fluctuating from 380s to 400s, RD unable to determine actual dry weight and weight changes since admission due to varying daily weights   Phillip Young, RDN, LDN  Clinical Nutrition

## 2023-03-14 NOTE — Progress Notes (Signed)
Physical Therapy Treatment Patient Details Name: Phillip Young MRN: [id number] DOB: 1985/11/15 Today's Date: 03/14/2023   History of Present Illness Pt is a 37 y/o M presenting to ED on 6/28 with fever, joint pain, and SOB after missing HD. Admitted for acute hypoxic respiratory failure. Intubated and started on CRRT, vasopressor support on 6/30. Extubated 7/20. Underwent DCCV 7/5, 7/6, and 7/7 without success. Pt off CRRT on 8/7.  PMH includes ESRD, NICM, refractory A fib, HFrEF    PT Comments  Pt received in supine and agreeable to session. Pt able to sit EOB with mod A +2, however requires total A +2 to return to supine due to pain and weakness. Pt able to sit EOB with BUE support and scoot forward without assist. Pt attempting to stand x2, however is unable to clear bottom from EOB with total A +2. Pt is unable to lateral scoot towards HOB with max cues and total A +2. Pt worried about wounds during mobility requiring encouragement to perform tasks with less assist. Pt becoming emotional during session due to discouragement. Pt continues to benefit from PT services to progress toward functional mobility goals.     If plan is discharge home, recommend the following: Two people to help with walking and/or transfers;Two people to help with bathing/dressing/bathroom;Direct supervision/assist for medications management;Direct supervision/assist for financial management;Assist for transportation;Help with stairs or ramp for entrance   Can travel by private vehicle        Equipment Recommendations  Other (comment) (TBD at next venue)    Recommendations for Other Services       Precautions / Restrictions Precautions Precautions: Fall Precaution Comments: wounds on hip/back/buttocks Restrictions Weight Bearing Restrictions: No     Mobility  Bed Mobility Overal bed mobility: Needs Assistance Bed Mobility: Rolling, Sidelying to Sit, Sit to Supine Rolling: Mod assist Sidelying to sit: Mod  assist, +2 for physical assistance   Sit to supine: +2 for physical assistance, Total assist   General bed mobility comments: Pt able to advance BLE towards EOB and hold bedrail, however requires mod A+2 for trunk elevation. Pt able to scoot forward at EOB without assist. Pt unable to lateral scoot towards [facility] and requires helicopter to return to supine due to BLE weakness.    Transfers Overall transfer level: Needs assistance Equipment used: Rolling walker (2 wheels) Transfers: Sit to/from Stand Sit to Stand: Total assist, +2 physical assistance           General transfer comment: Attempted STS x2 from EOB with total A +2 with use of bedpad with pt unable to power up with BLE and elevate bottom off of bed.        Balance Overall balance assessment: Needs assistance Sitting-balance support: Feet unsupported, Bilateral upper extremity supported, Feet supported Sitting balance-Leahy Scale: Fair Sitting balance - Comments: Sitting EOB   Standing balance support: Bilateral upper extremity supported, During functional activity, Reliant on assistive device for balance Standing balance-Leahy Scale: Zero Standing balance comment: Pt unable to achieve an upright stand with total A +2                            Cognition Arousal: Alert Behavior During Therapy: WFL for tasks assessed/performed Overall Cognitive Status: Impaired/Different from baseline                                 General Comments: Pt  distracted by pain requiring redirection and increased cues.        Exercises      General Comments        Pertinent Vitals/Pain Pain Assessment Pain Assessment: Faces Faces Pain Scale: Hurts whole lot Pain Location: buttocks, B feet/ankles Pain Descriptors / Indicators: Discomfort, Aching, Grimacing, Burning Pain Intervention(s): Monitored during session, Repositioned, Limited activity within patient's tolerance     PT Goals (current goals can  now be found in the care plan section) Acute Rehab PT Goals Patient Stated Goal: Not stated PT Goal Formulation: With patient Time For Goal Achievement: 03/07/23 Potential to Achieve Goals: Fair Progress towards PT goals: Progressing toward goals    Frequency    Min 1X/week      PT Plan         AM-PAC PT "6 Clicks" Mobility   Outcome Measure  Help needed turning from your back to your side while in a flat bed without using bedrails?: Total Help needed moving from lying on your back to sitting on the side of a flat bed without using bedrails?: Total Help needed moving to and from a bed to a chair (including a wheelchair)?: Total Help needed standing up from a chair using your arms (e.g., wheelchair or bedside chair)?: Total Help needed to walk in hospital room?: Total Help needed climbing 3-5 steps with a railing? : Total 6 Click Score: 6    End of Session Equipment Utilized During Treatment: Oxygen;Gait belt Activity Tolerance: Patient limited by pain Patient left: in bed;with call bell/phone within reach Nurse Communication: Mobility status PT Visit Diagnosis: Muscle weakness (generalized) (M62.81);Other abnormalities of gait and mobility (R26.89)     Time: 1610-9604 PT Time Calculation (min) (ACUTE ONLY): 36 min  Charges:    $Therapeutic Activity: 23-37 mins PT General Charges $$ ACUTE PT VISIT: 1 Visit                     Johny Shock, PTA Acute Rehabilitation Services Secure Chat Preferred  [phone]    Johny Shock 03/14/2023, 2:02 PM

## 2023-03-15 DIAGNOSIS — I480 Paroxysmal atrial fibrillation: Secondary | ICD-10-CM | POA: Diagnosis not present

## 2023-03-15 DIAGNOSIS — J9621 Acute and chronic respiratory failure with hypoxia: Secondary | ICD-10-CM | POA: Diagnosis not present

## 2023-03-15 DIAGNOSIS — J9622 Acute and chronic respiratory failure with hypercapnia: Secondary | ICD-10-CM | POA: Diagnosis not present

## 2023-03-15 DIAGNOSIS — I5023 Acute on chronic systolic (congestive) heart failure: Secondary | ICD-10-CM | POA: Diagnosis not present

## 2023-03-15 LAB — CBC
HCT: 26 % — ABNORMAL LOW (ref 39.0–52.0)
Hemoglobin: 8.4 g/dL — ABNORMAL LOW (ref 13.0–17.0)
MCH: 32.8 pg (ref 26.0–34.0)
MCHC: 32.3 g/dL (ref 30.0–36.0)
MCV: 101.6 fL — ABNORMAL HIGH (ref 80.0–100.0)
Platelets: 215 10*3/uL (ref 150–400)
RBC: 2.56 MIL/uL — ABNORMAL LOW (ref 4.22–5.81)
RDW: 17.4 % — ABNORMAL HIGH (ref 11.5–15.5)
WBC: 8.9 10*3/uL (ref 4.0–10.5)
nRBC: 0 % (ref 0.0–0.2)

## 2023-03-15 LAB — RENAL FUNCTION PANEL
Albumin: 2.6 g/dL — ABNORMAL LOW (ref 3.5–5.0)
Anion gap: 17 — ABNORMAL HIGH (ref 5–15)
BUN: 79 mg/dL — ABNORMAL HIGH (ref 6–20)
CO2: 21 mmol/L — ABNORMAL LOW (ref 22–32)
Calcium: 9.9 mg/dL (ref 8.9–10.3)
Chloride: 89 mmol/L — ABNORMAL LOW (ref 98–111)
Creatinine, Ser: 7.57 mg/dL — ABNORMAL HIGH (ref 0.61–1.24)
GFR, Estimated: 9 mL/min — ABNORMAL LOW (ref >60)
Glucose, Bld: 92 mg/dL (ref 70–99)
Phosphorus: 4.4 mg/dL (ref 2.5–4.6)
Potassium: 4.9 mmol/L (ref 3.5–5.1)
Sodium: 127 mmol/L — ABNORMAL LOW (ref 135–145)

## 2023-03-15 LAB — COOXEMETRY PANEL
Carboxyhemoglobin: 2.1 % — ABNORMAL HIGH (ref 0.5–1.5)
Methemoglobin: <0.7 % (ref 0.0–1.5)
O2 Saturation: 71.1 %
Total hemoglobin: 8.7 g/dL — ABNORMAL LOW (ref 12.0–16.0)

## 2023-03-15 LAB — GLUCOSE, CAPILLARY
Glucose-Capillary: 91 mg/dL (ref 70–99)
Glucose-Capillary: 128 mg/dL — ABNORMAL HIGH (ref 70–99)
Glucose-Capillary: 111 mg/dL — ABNORMAL HIGH (ref 70–99)
Glucose-Capillary: 120 mg/dL — ABNORMAL HIGH (ref 70–99)

## 2023-03-15 MED ORDER — ALBUMIN HUMAN 25 % IV SOLN
25.0000 g | Freq: Once | INTRAVENOUS | Status: AC
  Administered 2023-03-15: 25 g via INTRAVENOUS
  Filled 2023-03-15: qty 100

## 2023-03-15 MED ORDER — ANTICOAGULANT SODIUM CITRATE 4% (200MG/5ML) IV SOLN
5.0000 mL | Status: DC | PRN
  Administered 2023-03-15: 5 mL
  Filled 2023-03-15: qty 5

## 2023-03-15 NOTE — Progress Notes (Signed)
Subjective:   Summary: Phillip Young is a 37 y.o. year old male currently admitted on the [facility] HD#53 for Acute on chronic hypoxic respiratory failure, ESRD on HD.  Subjective:  Saw patient during his dialysis today. Patient was agitated, stating his back was hurting and did not want many of his medications. He stated "I know my body" as rationale for why he hadn't been taking his Eliquis, and that "it tears up my ass". Encouraged patient to tolerate dialysis as best he could and take medications as prescribed.   Objective:  Vital signs in last 24 hours: Vitals:   03/15/23 0910 03/15/23 0918 03/15/23 0935 03/15/23 1000  BP: (!) 93/58 (!) 119/101 90/62 (!) 88/62  Pulse: (!) 118 (!) 28 (!) 104 (!) 57  Resp: (!) 29 15 16 17   Temp:      TempSrc:      SpO2: 100% 98% 98% 98%  Weight:      Height:       Supplemental O2: HFNC SpO2: 98 % O2 Flow Rate (L/min): 6 L/min FiO2 (%): 30 %   Physical Exam:  Constitutional: chronically ill appearing, laying in bed, in no acute distress.  Cardiovascular: Irregular rate and rhythm, no murmurs, rubs or gallops Pulmonary/Chest: Anterior lung exam showed decreased breath sounds bilaterally  Abdominal: soft, non-tender, non-distended Skin: warm and dry Extremities: upper/lower extremity pulses 2+, no lower extremity edema present, tenderness to palpation in bilateral lower extremities up to mid-shin.  Neuro: Decreased sensation in medial aspect of right foot, moves lower extremities spontaneously  Filed Weights   03/13/23 0500 03/14/23 0500 03/15/23 0420  Weight: (!) 178.6 kg (!) 182.4 kg (!) 182.5 kg     Intake/Output Summary (Last 24 hours) at 03/15/2023 1032 Last data filed at 03/15/2023 0048 Gross per 24 hour  Intake 555 ml  Output 0 ml  Net 555 ml   Net IO Since Admission: 6,989.08 mL [03/15/23 1032]  Pertinent Labs:    Latest Ref Rng & Units 03/15/2023    6:40 AM 03/14/2023    5:48 AM 03/13/2023    1:21 PM  CBC   WBC 4.0 - 10.5 K/uL 8.9  7.8  7.0   Hemoglobin 13.0 - 17.0 g/dL 8.4  9.2  54.0   Hematocrit 39.0 - 52.0 % 26.0  28.2  31.9   Platelets 150 - 400 K/uL 215  201  198        Latest Ref Rng & Units 03/15/2023    6:40 AM 03/14/2023    5:48 AM 03/13/2023    1:21 PM  CMP  Glucose 70 - 99 mg/dL 92  981  97   BUN 6 - 20 mg/dL 79  57  46   Creatinine 0.61 - 1.24 mg/dL 1.91  4.78  2.95   Sodium 135 - 145 mmol/L 127  127  130   Potassium 3.5 - 5.1 mmol/L 4.9  4.4  4.6   Chloride 98 - 111 mmol/L 89  88  90   CO2 22 - 32 mmol/L 21  23  21    Calcium 8.9 - 10.3 mg/dL 9.9  62.1  30.8   Total Protein 6.5 - 8.1 g/dL  6.8    Total Bilirubin 0.3 - 1.2 mg/dL  0.6    Alkaline Phos 38 - 126 U/L  332    AST 15 - 41 U/L  33    ALT 0 -  44 U/L  14      Imaging: No results found.   Assessment/Plan:   Principal Problem:   Acute hypoxic respiratory failure ([name]) Active Problems:   Morbid obesity ([name])   Chronic systolic CHF (congestive heart failure) ([name])   Cardiogenic shock ([name])   ESRD (end stage renal disease) ([name])   ESRD (end stage renal disease) on dialysis ([name])   ESRD on dialysis ([name])   Hypervolemia   Sepsis with acute organ dysfunction without septic shock ([name])   Congestive heart failure ([name])   Atrial fibrillation ([name])   Pressure injury of skin   Central line infection, subsequent encounter   Patient Summary: Phillip Young is a 37 y.o. with a pertinent PMH of chronic hypoxic respiratory failure, ESRD, HFrEF with EF 20-25%, refractory Afib, amiodarone induced lung toxicity, who presented with fevers, joint pains, SOB and admitted for sepsis, now resolved.   #Acute on chronic HFrEF with pulmonary edema #Paroxysmal Afib/flutter with RVR #Nonischemic cardiomyopathy EF less than 20% -Remains in and out of Afib with HR ranging up to 150s -Patient intolerant of metoprolol and carvedilol due to allergy, amiodarone due to lung toxicity, diltiazem due to extremely reduced  EF -Cardiology signed off, however recommending digoxin every other day (non-dialysis days) and frequent digoxin levels. Appreciate recs -Continue Eliquis for stroke prophylaxis -Palliative consulted, no new recs from previous consultation. Appreciate assistance -TOC consulted for [facility], pending authorization  #ESRD on HD T/Th/S #Recurrent metabolic acidosis, improving S/P TDC repair on 8/14, has been receiving dialysis. Per notation, patient has consistently required midodrine and haldol in order to tolerate HD. HD schedule T/Th/Sa -Nephrology following, appreciate assistance -IR replaced [facility] 8/14, appreciate assistance. -Patient has had trouble tolerating due to skin breakdown and TDC clotting. Haldol as needed for agitation -Wound care reconsulted for ongoing skin breakdown making patient intolerant of dialysis. No further recs -Midodrine 30 TID for hypotension  #Acute on chronic hypoxemic/hypercapnic respiratory failure #Acute metabolic encephalopathy in setting of hypercapnia Patient was transferred from ICU on 8/9 after extubation and pressors were discontinued. Mental status has improved, patient is currently alert and following commands. Patient is questionably adherent with home CPAP -6L HFNC with O2 sats in mid 90s -Encourage incentive spirometry and PT/OT  #Bacteremia Blood cultures on 8/15 positive for enterobacter cloacae. WBC count has resolved to normal limits at 7.6. CXR showing pulmonary edema with new pleural effusions.  Unable to tolerate line holiday given needs for dialysis.  -Blood culture sensitivities reported, levoquin started 8/19 per pharmacy -Repeat BC NGTD  #Hypervolemic hyponatremia #Hyperphosphatemia #Hyperkalemia -Recurrent hyperkalemia. Treating as needed with lokelma, insulin/D50, albuterol, calcium gluconate -Monitor electrolytes with daily labs, supplement as necessary -Tube feeds d/c 8/12. Monitor blood glucose with frequent  checks  #Anemia of chronic disease Monitor with daily labs, transfuse as needed  #Skin breakdown Perineal and sacral skin breakdown. Patient refuses rectal tube -Continue local wound care -Wound care reconsulted, no new recs  #Lower Extremity Numbness  Pt states he has been having burning pain in his feet for the last two weeks. On exam, there is decreased sensation in the medial aspect of the right foot, but sensation is normal on the left foot. Last A1c four months ago was 5.6, less concern for diabetic neuropathy.  - Resume home gabapentin  #Mixed Septic and Cardiogenic Shock -Resolved, transferred to floor from ICU on 8/9  Diet: Normal IVF: None,   VTE: DOAC Code: Full PT/OT recs:   Dispo: Anticipated discharge to Skilled nursing facility pending  medical stability Monna Fam, MD PGY-1 Internal Medicine Resident Pager Number [phone] Please contact the on call pager after 5 pm and on weekends at [phone].

## 2023-03-15 NOTE — Progress Notes (Addendum)
HD Nursing Progress:   0655: Hand over received from [facility].            Still on 6L of oxygen, c/o pains - given Oxy at 0518hrs.  84: Received patient from ward.           Patient is alert and oriented, no complains at this time, except:           - pain on lower back, hips - chronic, with existing wounds.           Baseline vitals: 117/50, 100, 98.4, 22, 100% on 6L/min.           RIJ PermCath accessed aseptically for the treatment today.           - no erythema or drainage.           - no issues noted.  0801: Started HD treatment as prescribed (please see HD treatment flow sheet).           UF goal 3.5 Kg as ordered.           Seen at the same time by Wille Celeste, PA-C - increased UF goal to 4L.           Was given Midodrine per [facility] prior HD.           Additionally, to keep SBP>39mmHg.  1208: HD treatment completed, tolerated fairly well, except:           - lowish BP but asymptomatic - per PA Samara Deist that's his baseline.           - denies cramping, no lightheadedness, no chest pain, HR A-Fib baseline as well.           - denies nausea / no vomiting. NAD.                      No clotting / no loss of blood during HD.           RIJ access: **Na Citrate** locked.           Ordered duration: 4.0 hours, completed.           Total fluids given: (IV Albumin per MAR), Juice.           UF removed (net): (goal met for this session).           Post-HD vitals: 102/52, 78, 98.4, 16, 100% on 6L/min.   1225: Hand over done to [employer].

## 2023-03-15 NOTE — Plan of Care (Signed)
  Problem: Education: Goal: Knowledge of General Education information will improve Description: Including pain rating scale, medication(s)/side effects and non-pharmacologic comfort measures Outcome: Progressing   Problem: Health Behavior/Discharge Planning: Goal: Ability to manage health-related needs will improve Outcome: Progressing   Problem: Clinical Measurements: Goal: Ability to maintain clinical measurements within normal limits will improve Outcome: Progressing Goal: Will remain free from infection Outcome: Progressing Goal: Diagnostic test results will improve Outcome: Progressing Goal: Respiratory complications will improve Outcome: Progressing Goal: Cardiovascular complication will be avoided Outcome: Progressing   Problem: Activity: Goal: Risk for activity intolerance will decrease Outcome: Progressing   Problem: Nutrition: Goal: Adequate nutrition will be maintained Outcome: Progressing   Problem: Coping: Goal: Level of anxiety will decrease Outcome: Progressing   Problem: Elimination: Goal: Will not experience complications related to bowel motility Outcome: Progressing   Problem: Pain Managment: Goal: General experience of comfort will improve Outcome: Progressing   Problem: Skin Integrity: Goal: Risk for impaired skin integrity will decrease Outcome: Progressing   Problem: Education: Goal: Ability to describe self-care measures that may prevent or decrease complications (Diabetes Survival Skills Education) will improve Outcome: Progressing Goal: Individualized Educational Video(s) Outcome: Progressing   Problem: Coping: Goal: Ability to adjust to condition or change in health will improve Outcome: Progressing   Problem: Fluid Volume: Goal: Ability to maintain a balanced intake and output will improve Outcome: Progressing   Problem: Health Behavior/Discharge Planning: Goal: Ability to identify and utilize available resources and services  will improve Outcome: Progressing Goal: Ability to manage health-related needs will improve Outcome: Progressing   Problem: Metabolic: Goal: Ability to maintain appropriate glucose levels will improve Outcome: Progressing   Problem: Nutritional: Goal: Maintenance of adequate nutrition will improve Outcome: Progressing Goal: Progress toward achieving an optimal weight will improve Outcome: Progressing   Problem: Skin Integrity: Goal: Risk for impaired skin integrity will decrease Outcome: Progressing   Problem: Tissue Perfusion: Goal: Adequacy of tissue perfusion will improve Outcome: Progressing

## 2023-03-15 NOTE — Progress Notes (Signed)
[facility] Progress Note   Subjective:  Seen on HD - running 4hr, 4L UFG today - see how handles. Denies CP. Using O2 6L/min.  Objective Vitals:   03/15/23 0348 03/15/23 0420 03/15/23 0759 03/15/23 0801  BP: 103/63  (!) 117/50 106/69  Pulse: 96  100 (!) 102  Resp: 20 (!) 21 (!) 22 15  Temp: 98.8 F (37.1 C)  98.4 F (36.9 C)   TempSrc: Oral  Oral   SpO2: 98%  100% 100%  Weight:  (!) 182.5 kg    Height:       Physical Exam General: Obese man, NAD, Nasal O2 6L/min Heart: RRR; no murmur Lungs: CTA laterally, unable to sit up for posterior exam Abdomen: obese, non-tender Extremities: no LE edema Dialysis Access: [facility] in R chest  Additional Objective Labs: Basic Metabolic Panel: Recent Labs  Lab 03/09/23 1322 03/10/23 0609 03/11/23 0429 03/12/23 0528 03/13/23 1321 03/14/23 0548 03/15/23 0640  NA 130*   < > 129*   < > 130* 127* 127*  K 5.0   < > 4.1   < > 4.6 4.4 4.9  CL 89*   < > 91*   < > 90* 88* 89*  CO2 16*   < > 19*   < > 21* 23 21*  GLUCOSE 117*   < > 96   < > 97 103* 92  BUN 158*   < > 103*   < > 46* 57* 79*  CREATININE 9.02*   < > 6.77*   < > 4.69* 6.10* 7.57*  CALCIUM 9.2   < > 9.2   < > 10.5* 10.1 9.9  PHOS 5.3*  --  4.0  --   --   --  4.4   < > = values in this interval not displayed.   Liver Function Tests: Recent Labs  Lab 03/10/23 0609 03/11/23 0429 03/12/23 0528 03/14/23 0548 03/15/23 0640  AST 60*  --  56* 33  --   ALT 30  --  15 14  --   ALKPHOS 393*  --  363* 332*  --   BILITOT 1.0  --  1.0 0.6  --   PROT 6.6  --  6.4* 6.8  --   ALBUMIN 2.5*   < > 2.5* 2.7* 2.6*   < > = values in this interval not displayed.   CBC: Recent Labs  Lab 03/08/23 2008 03/09/23 0216 03/09/23 0600 03/10/23 0609 03/11/23 0429 03/12/23 0528 03/13/23 1321 03/14/23 0548 03/15/23 0640  WBC 9.7   < > 13.0*   < > 9.4 7.6 7.0 7.8 8.9  NEUTROABS 9.4*  --  12.5*  --   --   --   --   --   --   HGB 8.6*   < > 8.3*   < > 8.0* 8.7* 10.1* 9.2* 8.4*   HCT 26.6*   < > 26.0*   < > 25.5* 27.5* 31.9* 28.2* 26.0*  MCV 104.3*   < > 103.6*   < > 106.3* 102.2* 102.2* 103.3* 101.6*  PLT 245   < > 231   < > 156 169 198 201 215   < > = values in this interval not displayed.   Medications:  sodium chloride 10 mL/hr at 02/21/23 1444   anticoagulant sodium citrate      (feeding supplement) PROSource Plus  30 mL Oral BID BM   apixaban  5 mg Oral BID   Chlorhexidine Gluconate Cloth  6 each  Topical Q0600   cholecalciferol  1,000 Units Oral Daily   darbepoetin (ARANESP) injection - DIALYSIS  200 mcg Subcutaneous Q Sat-1800   digoxin  0.125 mg Oral Once per day on Monday Wednesday Friday   famotidine  10 mg Oral Daily   gabapentin  100 mg Oral TID   gabapentin  100 mg Oral Once   levofloxacin  500 mg Oral Q24H   lidocaine  1 patch Transdermal Q24H   melatonin  5 mg Oral QHS   midodrine  30 mg Oral TID WC   multivitamin  1 tablet Oral QHS   mouth rinse  15 mL Mouth Rinse 4 times per day   senna-docusate  1 tablet Oral QHS   sodium chloride flush  10-40 mL Intracatheter Q12H    Dialysis Orders: [facility]  on TTS schedule  4.25hr, EDW 180kg  425/1.5  TDC, no heparin (HIT +), lock with Na Citrate     Assessment/Plan: Acute on chronic hypotension: Able to wean off pressors on 8/3, on high dose midodrine 30mg  TID. Acute on chronic resp failure: Last extubated 7/20, has intermittently required BiPAP, now on 3-6L/min O2. A-fib with RVR: Refractory to DCCV (3 attempts) and amiodarone. Persistent A-fib/flutter - HR > 100. On Eliquis. Acute on chronic HFrEF: EF 20-25%. Per cardiology, not a candidate for nodal ablation, ICD, or pacemaker. Pneumococcal bacteremia/Enterobacter cloacae bacteremia. Admitted in septic shock, complex course. Last Blood cx + on 8/13 - plan was for line holiday but felt risky due to his resp status - instead just exchanged over wire on 8/14. ESRD: Usual TTS schedule. S/p CRRT 6/29-8/7. Gets extra HD at times - s/p HD 8/14, 8/16,  8/17. HD now - have increased time and UF goal as tolerated.  Volume: Remains volume up with hyponatremia: Lowest here is 178kg - trying to keep him there. Anemia of ESRD: Hgb 8.4 - continue Aranesp q Sat (max). Secondary HPTH: CorrCa high, Phos ok - no binder at this time, hold VDRA. Nutrition: Alb low, continue supplements (changed Nepro to pro-source to limit volume). Decubitus ulcerations: Occ restlessness while on HD. Prognosis: Poor. Considering [facility].  Ozzie Hoyle, PA-C 03/15/2023, 8:18 AM  [employer]

## 2023-03-15 NOTE — Progress Notes (Signed)
   03/15/23 0155  BiPAP/CPAP/SIPAP  $ Non-Invasive Home Ventilator  Subsequent  BiPAP/CPAP/SIPAP Pt Type Adult  BiPAP/CPAP/SIPAP V60  Mask Type Full face mask  Mask Size Extra large  Set Rate 18 breaths/min  Respiratory Rate 22 breaths/min  IPAP 16 cmH20  EPAP 8 cmH2O  FiO2 (%) 30 %  Minute Ventilation 12.2  Leak 16  Peak Inspiratory Pressure (PIP) 16  Tidal Volume (Vt) 572  Patient Home Equipment No  Auto Titrate No  Press High Alarm 25 cmH2O  Press Low Alarm 5 cmH2O  BiPAP/CPAP /SiPAP Vitals  Pulse Rate (!) 110  Resp (!) 22

## 2023-03-15 NOTE — Progress Notes (Signed)
Pt not in room at this time. BiPAP machine (V60) on stby at bedside.

## 2023-03-16 DIAGNOSIS — Z992 Dependence on renal dialysis: Secondary | ICD-10-CM | POA: Diagnosis not present

## 2023-03-16 DIAGNOSIS — J9601 Acute respiratory failure with hypoxia: Secondary | ICD-10-CM | POA: Diagnosis not present

## 2023-03-16 DIAGNOSIS — N186 End stage renal disease: Secondary | ICD-10-CM | POA: Diagnosis not present

## 2023-03-16 LAB — GLUCOSE, CAPILLARY
Glucose-Capillary: 103 mg/dL — ABNORMAL HIGH (ref 70–99)
Glucose-Capillary: 116 mg/dL — ABNORMAL HIGH (ref 70–99)
Glucose-Capillary: 92 mg/dL (ref 70–99)
Glucose-Capillary: 110 mg/dL — ABNORMAL HIGH (ref 70–99)
Glucose-Capillary: 106 mg/dL — ABNORMAL HIGH (ref 70–99)

## 2023-03-16 LAB — CBC
HCT: 25.9 % — ABNORMAL LOW (ref 39.0–52.0)
Hemoglobin: 8.1 g/dL — ABNORMAL LOW (ref 13.0–17.0)
MCH: 32.1 pg (ref 26.0–34.0)
MCHC: 31.3 g/dL (ref 30.0–36.0)
MCV: 102.8 fL — ABNORMAL HIGH (ref 80.0–100.0)
Platelets: 227 10*3/uL (ref 150–400)
RBC: 2.52 MIL/uL — ABNORMAL LOW (ref 4.22–5.81)
RDW: 17.9 % — ABNORMAL HIGH (ref 11.5–15.5)
WBC: 7.8 10*3/uL (ref 4.0–10.5)
nRBC: 0 % (ref 0.0–0.2)

## 2023-03-16 LAB — CULTURE, BLOOD (ROUTINE X 2)
Culture: NO GROWTH
Culture: NO GROWTH
Special Requests: ADEQUATE

## 2023-03-16 LAB — BASIC METABOLIC PANEL
Anion gap: 15 (ref 5–15)
BUN: 43 mg/dL — ABNORMAL HIGH (ref 6–20)
CO2: 24 mmol/L (ref 22–32)
Calcium: 10.4 mg/dL — ABNORMAL HIGH (ref 8.9–10.3)
Chloride: 91 mmol/L — ABNORMAL LOW (ref 98–111)
Creatinine, Ser: 5.41 mg/dL — ABNORMAL HIGH (ref 0.61–1.24)
GFR, Estimated: 13 mL/min — ABNORMAL LOW (ref >60)
Glucose, Bld: 94 mg/dL (ref 70–99)
Potassium: 4.2 mmol/L (ref 3.5–5.1)
Sodium: 130 mmol/L — ABNORMAL LOW (ref 135–145)

## 2023-03-16 LAB — COOXEMETRY PANEL
Carboxyhemoglobin: 2 % — ABNORMAL HIGH (ref 0.5–1.5)
Methemoglobin: 1.3 % (ref 0.0–1.5)
O2 Saturation: 60.5 %
Total hemoglobin: 8.7 g/dL — ABNORMAL LOW (ref 12.0–16.0)

## 2023-03-16 MED ORDER — HYDROMORPHONE HCL 1 MG/ML IJ SOLN
1.0000 mg | Freq: Three times a day (TID) | INTRAMUSCULAR | Status: DC | PRN
  Administered 2023-03-17 – 2023-03-18 (×3): 1 mg via INTRAVENOUS
  Filled 2023-03-16 (×3): qty 1

## 2023-03-16 MED ORDER — LEVOFLOXACIN 500 MG PO TABS
500.0000 mg | ORAL_TABLET | ORAL | Status: DC
  Administered 2023-03-17: 500 mg via ORAL
  Filled 2023-03-16: qty 1

## 2023-03-16 MED ORDER — ALBUMIN HUMAN 25 % IV SOLN
25.0000 g | INTRAVENOUS | Status: DC
  Administered 2023-03-17: 25 g via INTRAVENOUS

## 2023-03-16 NOTE — Progress Notes (Signed)
Subjective:   Summary: Phillip Young is a 37 y.o. year old male currently admitted on the IMTS HD#54 for Acute on chronic hypoxic respiratory failure, ESRD on HD.  Subjective:  Saw patient at bedside this morning. Patient has been feeling well and feels like he is getting stronger. Endorses continued leg pain and a freezing sensation. Told him we would start new pain medication for him. Received all his medications yesterday. Informed about efforts to get him transferred to [facility].   Objective:  Vital signs in last 24 hours: Vitals:   03/16/23 0000 03/16/23 0117 03/16/23 0550 03/16/23 0745  BP: 97/63  95/64 (!) 130/114  Pulse:   95 61  Resp: 20  20 20   Temp: 98.9 F (37.2 C)  98.9 F (37.2 C) 98.7 F (37.1 C)  TempSrc: Oral  Axillary Oral  SpO2: 99% 100%  92%  Weight:      Height:       Supplemental O2: HFNC SpO2: 92 % O2 Flow Rate (L/min): 5 L/min FiO2 (%): 30 %   Physical Exam:  Constitutional: chronically ill appearing, laying in bed, in no acute distress.  Cardiovascular: Irregular rate and rhythm, no murmurs, rubs or gallops Pulmonary/Chest: Anterior lung exam showed decreased breath sounds bilaterally  Abdominal: soft, non-tender, non-distended Skin: warm and dry Extremities: upper/lower extremity pulses 2+, no lower extremity edema present, tenderness to palpation in bilateral lower extremities up to mid-shin.  Neuro: Decreased sensation in medial aspect of right foot, moves lower extremities spontaneously  Filed Weights   03/13/23 0500 03/14/23 0500 03/15/23 0420  Weight: (!) 178.6 kg (!) 182.4 kg (!) 182.5 kg     Intake/Output Summary (Last 24 hours) at 03/16/2023 0957 Last data filed at 03/15/2023 1400 Gross per 24 hour  Intake 240 ml  Output 4000 ml  Net -3760 ml   Net IO Since Admission: 3,347.08 mL [03/16/23 0957]  Pertinent Labs:    Latest Ref Rng & Units 03/16/2023    6:10 AM 03/15/2023    6:40 AM 03/14/2023    5:48 AM  CBC   WBC 4.0 - 10.5 K/uL 7.8  8.9  7.8   Hemoglobin 13.0 - 17.0 g/dL 8.1  8.4  9.2   Hematocrit 39.0 - 52.0 % 25.9  26.0  28.2   Platelets 150 - 400 K/uL 227  215  201        Latest Ref Rng & Units 03/16/2023    6:10 AM 03/15/2023    6:40 AM 03/14/2023    5:48 AM  CMP  Glucose 70 - 99 mg/dL 94  92  161   BUN 6 - 20 mg/dL 43  79  57   Creatinine 0.61 - 1.24 mg/dL 0.96  0.45  4.09   Sodium 135 - 145 mmol/L 130  127  127   Potassium 3.5 - 5.1 mmol/L 4.2  4.9  4.4   Chloride 98 - 111 mmol/L 91  89  88   CO2 22 - 32 mmol/L 24  21  23    Calcium 8.9 - 10.3 mg/dL 81.1  9.9  91.4   Total Protein 6.5 - 8.1 g/dL   6.8   Total Bilirubin 0.3 - 1.2 mg/dL   0.6   Alkaline Phos 38 - 126 U/L   332   AST 15 - 41 U/L   33   ALT 0 - 44 U/L   14  Imaging: No results found.   Assessment/Plan:   Principal Problem:   Acute hypoxic respiratory failure ([name]) Active Problems:   Morbid obesity ([name])   Chronic systolic CHF (congestive heart failure) ([name])   Cardiogenic shock ([name])   ESRD (end stage renal disease) ([name])   ESRD (end stage renal disease) on dialysis ([name])   ESRD on dialysis ([name])   Hypervolemia   Sepsis with acute organ dysfunction without septic shock ([name])   Congestive heart failure ([name])   Atrial fibrillation ([name])   Pressure injury of skin   Central line infection   Patient Summary: Phillip Young is a 37 y.o. with a pertinent PMH of chronic hypoxic respiratory failure, ESRD, HFrEF with EF 20-25%, refractory Afib, amiodarone induced lung toxicity, who presented with fevers, joint pains, SOB and admitted for sepsis, now resolved.   #Acute on chronic HFrEF with pulmonary edema #Paroxysmal Afib/flutter with RVR #Nonischemic cardiomyopathy EF less than 20% -Remains in and out of Afib with HR ranging up to 150s -Patient intolerant of metoprolol and carvedilol due to allergy, amiodarone due to lung toxicity, diltiazem due to extremely reduced EF -Cardiology signed off, however  recommending digoxin every other day (non-dialysis days) and frequent digoxin levels. Appreciate recs -Continue Eliquis for stroke prophylaxis -Palliative consulted, no new recs from previous consultation. Appreciate assistance -TOC consulted for [facility] placement, pending authorization  #ESRD on HD T/Th/S #Recurrent metabolic acidosis, improving S/P TDC repair on 8/14, has been receiving dialysis. Per notation, patient has consistently required midodrine and haldol in order to tolerate HD. HD schedule T/Th/Sa -Nephrology following, appreciate assistance -IR replaced [facility] 8/14, appreciate assistance. -Patient has had trouble tolerating due to skin breakdown and TDC clotting. Haldol as needed for agitation -Wound care reconsulted for ongoing skin breakdown making patient intolerant of dialysis. No further recs -Midodrine 30 TID for hypotension  #Acute on chronic hypoxemic/hypercapnic respiratory failure #Acute metabolic encephalopathy in setting of hypercapnia Patient was transferred from ICU on 8/9 after extubation and pressors were discontinued. Mental status has improved, patient is currently alert and following commands. Patient is questionably adherent with home CPAP -6L HFNC with O2 sats in mid 90s -Encourage incentive spirometry and PT/OT  #Bacteremia Blood cultures on 8/15 positive for enterobacter cloacae. WBC count has resolved to normal limits at 7.6. CXR showing pulmonary edema with new pleural effusions.  Unable to tolerate line holiday given needs for dialysis.  -Blood culture sensitivities reported, levoquin started 8/19 per pharmacy -Repeat BC NGTD  #Hypervolemic hyponatremia #Hyperphosphatemia #Hyperkalemia -Recurrent hyperkalemia. Treating as needed with lokelma, insulin/D50, albuterol, calcium gluconate -Monitor electrolytes with daily labs, supplement as necessary -Tube feeds d/c 8/12. Monitor blood glucose with frequent checks  #Anemia of chronic disease Monitor  with daily labs, transfuse as needed  #Skin breakdown Perineal and sacral skin breakdown. Patient refuses rectal tube -Continue local wound care -Wound care reconsulted, no new recs  #Lower Extremity Numbness  Pt states he has been having burning pain in his feet for the last two weeks. On exam, there is decreased sensation in the medial aspect of the right foot, but sensation is normal on the left foot. Last A1c four months ago was 5.6, less concern for diabetic neuropathy.  - Resume home gabapentin - Dilaudid 1mg  q8 PRN  #Mixed Septic and Cardiogenic Shock -Resolved, transferred to floor from ICU on 8/9  Diet: Normal IVF: None,   VTE: DOAC Code: Full  Dispo: Anticipated discharge to Skilled nursing facility pending medical stability Monna Fam, MD PGY-1 Internal Medicine Resident  Pager Number [phone] Please contact the on call pager after 5 pm and on weekends at [phone].

## 2023-03-16 NOTE — Progress Notes (Signed)
RT at bedside to assess pt. RT found pt on [place] with BiPAP on stby at bedside.

## 2023-03-16 NOTE — TOC Progression Note (Signed)
Transition of Care [facility]) - Progression Note    Patient Details  Name: Phillip Young MRN: [id number] Date of Birth: 07-26-1986  Transition of [facility]) CM/SW Contact  Elliot Cousin, RN Phone Number: [phone] 03/16/2023, 9:06 AM  Clinical Narrative:   CM sent message to [facility] rep, Latoya to follow up on pending BCBS auth for LTAC.     Expected Discharge Plan: Home/Self Care Barriers to Discharge: Continued Medical Work up  Expected Discharge Plan and Services   Discharge Planning Services: CM Consult   Living arrangements for the past 2 months: Single Family Home                                       Social Determinants of Health (SDOH) Interventions SDOH Screenings   Food Insecurity: No Food Insecurity (01/22/2023)  Housing: Low Risk  (01/22/2023)  Transportation Needs: No Transportation Needs (01/22/2023)  Utilities: Not At Risk (01/22/2023)  Alcohol Screen: Low Risk  (09/21/2022)  Depression (PHQ2-9): High Risk (09/21/2022)  Financial Resource Strain: High Risk (09/21/2022)  Physical Activity: Inactive (09/21/2022)  Social Connections: Unknown (09/21/2022)  Stress: No Stress Concern Present (09/21/2022)  Tobacco Use: Medium Risk (01/21/2023)    Readmission Risk Interventions    06/09/2022   12:19 PM 05/12/2021   10:35 AM 10/16/2020    2:10 PM  Readmission Risk Prevention Plan  Transportation Screening Complete Complete Complete  PCP or Specialist Appt within 5-7 Days   Complete  Home Care Screening   Complete  Medication Review (RN CM)   Referral to Pharmacy  Medication Review (RN Care Manager) Complete Complete   PCP or Specialist appointment within 3-5 days of discharge Complete Complete   HRI or Home Care Consult Complete Complete   SW Recovery Care/Counseling Consult Complete Complete   Palliative Care Screening Not Applicable Complete   Skilled Nursing Facility Not Applicable Complete

## 2023-03-16 NOTE — Progress Notes (Signed)
Speech Language Pathology Treatment:    Patient Details Name: Phillip Young MRN: [id number] DOB: July 08, 1986 Today's Date: 03/16/2023 Time: 1330-1400 SLP Time Calculation (min) (ACUTE ONLY): 30 min  Assessment / Plan / Recommendation Clinical Impression  Patient seen by SLP for skilled treatment focused on dysphagia goals. COTA also present in room who was assisting with patient's bed mobility. After repositioning patient to chair position, SLP observed him with PO's of thin liquids and regular solids. Of note, patient had been on a Dys 2 (minced) solids, nectar thick liquids diet but his diet was changed by MD to renal diet (regular texture solids, thin liquids) on 03/10/23. Per RN, patient has been eating very well and patient confirms this. There have not been any c/o from nursing or medical staff, patient or family regarding swallowing difficulties since this change. Today, patient drinking thin liquids via straw sips with no overt s/s of aspiration and timely swallow initiation. He did not exhibit any difficulty or delays with mastication of solids. Patient denies any swallowing difficulties but does not recall any past interventions from SLP department, including FEES swallow evaluation approximately one month ago. Today, patient is exhibiting significantly improved voicing with intensity WFL but patient still with dysphonia (hoarseness). (In comparison, when this SLP worked with patient on 02/24/23, his voice was completley aphonic at that time.) SLP recommending to continue on regular solids, thin liquids diet and will follow to ensure toleration.    HPI HPI: Pt is a 37 year old male who presented with fever, joint pains, SOB. ETT 6/30-7/20.  As of 8/8, off pressors and CRRT with plans to start HD in room. PMH: ESRD, GERD, NICM, refractory AFib, ?amio-induced lung toxicity.      SLP Plan  Continue with current plan of care      Recommendations for follow up therapy are one component of a  multi-disciplinary discharge planning process, led by the attending physician.  Recommendations may be updated based on patient status, additional functional criteria and insurance authorization.    Recommendations  Diet recommendations: Regular;Thin liquid Liquids provided via: Cup;Straw Medication Administration: Whole meds with puree Supervision: Patient able to self feed;Intermittent supervision to cue for compensatory strategies Compensations: Slow rate;Small sips/bites;Follow solids with liquid Postural Changes and/or Swallow Maneuvers: Seated upright 90 degrees                  Oral care BID   Intermittent Supervision/Assistance Dysphagia, oropharyngeal phase (R13.12)     Continue with current plan of care     Angela Nevin, MA, CCC-SLP Speech Therapy

## 2023-03-16 NOTE — Progress Notes (Signed)
Occupational Therapy Treatment Patient Details Name: Phillip Young MRN: [id number] DOB: [birth year] Today's Date: 03/16/2023   History of present illness Pt is a 37 y/o M presenting to ED on 6/28 with fever, joint pain, and SOB after missing HD. Admitted for acute hypoxic respiratory failure. Intubated and started on CRRT, vasopressor support on 6/30. Extubated 7/20. Underwent DCCV 7/5, 7/6, and 7/7 without success. Pt off CRRT on 8/7.  PMH includes ESRD, NICM, refractory A fib, HFrEF   OT comments  Patient performed rolling in bed for pad placement with mod assist. Patient agreeable to get to EOB and required mod assist +2 due to assistance with BLEs and trunk. Patient tolerated 10 minutes of sitting on EOB before returning to supine with max assist of one. Patient required assistance for positioning in bed. Acute OT to continue to follow to address bed mobility, grooming, and progressing towards transfers.       If plan is discharge home, recommend the following:  Two people to help with walking and/or transfers;Two people to help with bathing/dressing/bathroom;Assistance with cooking/housework;Assistance with feeding;Direct supervision/assist for medications management;Direct supervision/assist for financial management;Help with stairs or ramp for entrance;Assist for transportation   Equipment Recommendations  Hospital bed;Wheelchair cushion (measurements OT);Wheelchair (measurements OT)    Recommendations for Other Services      Precautions / Restrictions Precautions Precautions: Fall Precaution Comments: wounds on hip/back/buttocks Restrictions Weight Bearing Restrictions: No       Mobility Bed Mobility Overal bed mobility: Needs Assistance Bed Mobility: Rolling, Sidelying to Sit, Sit to Supine Rolling: Mod assist Sidelying to sit: Mod assist, +2 for physical assistance   Sit to supine: Max assist   General bed mobility comments: mod assist +2 to get to EOB but able to get to  supine with max assist of one    Transfers                         Balance Overall balance assessment: Needs assistance Sitting-balance support: Feet unsupported, Bilateral upper extremity supported, Feet supported Sitting balance-Leahy Scale: Fair Sitting balance - Comments: Sitting EOB                                   ADL either performed or assessed with clinical judgement   ADL Overall ADL's : Needs assistance/impaired         Upper Body Bathing: Maximal assistance;Sitting Upper Body Bathing Details (indicate cue type and reason): washed back sitting on EOB                                Extremity/Trunk Assessment              Vision       Perception     Praxis      Cognition Arousal: Alert Behavior During Therapy: WFL for tasks assessed/performed Overall Cognitive Status: Impaired/Different from baseline Area of Impairment: Safety/judgement                         Safety/Judgement: Decreased awareness of deficits     General Comments: in good spirits, eager to sit EOB        Exercises      Shoulder Instructions       General Comments      Pertinent Vitals/ Pain  Pain Assessment Pain Assessment: Faces Faces Pain Scale: Hurts whole lot Pain Location: buttocks, B feet/ankles Pain Descriptors / Indicators: Discomfort, Aching, Grimacing, Burning Pain Intervention(s): Limited activity within patient's tolerance, Monitored during session, Repositioned, RN gave pain meds during session  Home Living                                          Prior Functioning/Environment              Frequency  Min 1X/week        Progress Toward Goals  OT Goals(current goals can now be found in the care plan section)  Progress towards OT goals: Progressing toward goals  Acute Rehab OT Goals Patient Stated Goal: walk again OT Goal Formulation: With patient Time For Goal  Achievement: 03/21/23 Potential to Achieve Goals: Fair ADL Goals Pt Will Perform Grooming: with set-up;sitting Pt Will Perform Upper Body Bathing: with supervision;sitting Pt Will Perform Upper Body Dressing: sitting;with min assist Pt/caregiver will Perform Home Exercise Program: Both right and left upper extremity;With written HEP provided;With minimal assist Additional ADL Goal #1: pt will perfrom bed mobility mod A +2 in prep for ADLs/mobility and to promote pressure relief techniques  Plan      Co-evaluation                 AM-PAC OT "6 Clicks" Daily Activity     Outcome Measure   Help from another person eating meals?: A Little Help from another person taking care of personal grooming?: A Little Help from another person toileting, which includes using toliet, bedpan, or urinal?: Total Help from another person bathing (including washing, rinsing, drying)?: A Lot Help from another person to put on and taking off regular upper body clothing?: A Lot Help from another person to put on and taking off regular lower body clothing?: Total 6 Click Score: 12    End of Session Equipment Utilized During Treatment: Oxygen  OT Visit Diagnosis: Unsteadiness on feet (R26.81);Other abnormalities of gait and mobility (R26.89);Muscle weakness (generalized) (M62.81);Other symptoms and signs involving cognitive function   Activity Tolerance Patient tolerated treatment well;Patient limited by pain   Patient Left in bed;with call bell/phone within reach   Nurse Communication Mobility status        Time: 1327-1410 OT Time Calculation (min): 43 min  Charges: OT General Charges $OT Visit: 1 Visit OT Treatments $Self Care/Home Management : 8-22 mins $Therapeutic Activity: 23-37 mins  Phillip Young, OTA Acute Rehabilitation Services  Office [phone]   Phillip Young 03/16/2023, 2:24 PM

## 2023-03-16 NOTE — Progress Notes (Signed)
[facility] Progress Note   Subjective:   Seen in room - feels well for most part, generalized aches/pains. No CP. Dialyzed yesterday - ongoing hypotension and tachycardia - but did decently well, got nearly 4L off him. Try for the same tomorrow.  Objective Vitals:   03/16/23 0000 03/16/23 0117 03/16/23 0550 03/16/23 0745  BP: 97/63  95/64 (!) 130/114  Pulse:   95 61  Resp: 20  20 20   Temp: 98.9 F (37.2 C)  98.9 F (37.2 C) 98.7 F (37.1 C)  TempSrc: Oral  Axillary Oral  SpO2: 99% 100%  92%  Weight:      Height:       Physical Exam General: Obese man, NAD, Nasal O2 6L/min Heart: RRR; no murmur Lungs: CTA laterally, unable to sit up for posterior exam Abdomen: obese, non-tender Extremities: no LE edema Dialysis Access: [facility] in R chest  Additional Objective Labs: Basic Metabolic Panel: Recent Labs  Lab 03/09/23 1322 03/10/23 0609 03/11/23 0429 03/12/23 0528 03/14/23 0548 03/15/23 0640 03/16/23 0610  NA 130*   < > 129*   < > 127* 127* 130*  K 5.0   < > 4.1   < > 4.4 4.9 4.2  CL 89*   < > 91*   < > 88* 89* 91*  CO2 16*   < > 19*   < > 23 21* 24  GLUCOSE 117*   < > 96   < > 103* 92 94  BUN 158*   < > 103*   < > 57* 79* 43*  CREATININE 9.02*   < > 6.77*   < > 6.10* 7.57* 5.41*  CALCIUM 9.2   < > 9.2   < > 10.1 9.9 10.4*  PHOS 5.3*  --  4.0  --   --  4.4  --    < > = values in this interval not displayed.   Liver Function Tests: Recent Labs  Lab 03/10/23 0609 03/11/23 0429 03/12/23 0528 03/14/23 0548 03/15/23 0640  AST 60*  --  56* 33  --   ALT 30  --  15 14  --   ALKPHOS 393*  --  363* 332*  --   BILITOT 1.0  --  1.0 0.6  --   PROT 6.6  --  6.4* 6.8  --   ALBUMIN 2.5*   < > 2.5* 2.7* 2.6*   < > = values in this interval not displayed.   CBC: Recent Labs  Lab 03/12/23 0528 03/13/23 1321 03/14/23 0548 03/15/23 0640 03/16/23 0610  WBC 7.6 7.0 7.8 8.9 7.8  HGB 8.7* 10.1* 9.2* 8.4* 8.1*  HCT 27.5* 31.9* 28.2* 26.0* 25.9*  MCV 102.2*  102.2* 103.3* 101.6* 102.8*  PLT 169 198 201 215 227   Medications:  sodium chloride 10 mL/hr at 02/21/23 1444    (feeding supplement) PROSource Plus  30 mL Oral BID BM   apixaban  5 mg Oral BID   Chlorhexidine Gluconate Cloth  6 each Topical Q0600   cholecalciferol  1,000 Units Oral Daily   darbepoetin (ARANESP) injection - DIALYSIS  200 mcg Subcutaneous Q Sat-1800   digoxin  0.125 mg Oral Once per day on Monday Wednesday Friday   famotidine  10 mg Oral Daily   gabapentin  100 mg Oral TID   [START ON 03/17/2023] levofloxacin  500 mg Oral Q48H   lidocaine  1 patch Transdermal Q24H   melatonin  5 mg Oral QHS   midodrine  30  mg Oral TID WC   multivitamin  1 tablet Oral QHS   mouth rinse  15 mL Mouth Rinse 4 times per day   senna-docusate  1 tablet Oral QHS   sodium chloride flush  10-40 mL Intracatheter Q12H    Dialysis Orders: [facility]  on TTS schedule  4.25hr, EDW 180kg  425/1.5  TDC, no heparin (HIT +), lock with Na Citrate     Assessment/Plan: Acute on chronic hypotension: Able to wean off pressors on 8/3, on high dose midodrine 30mg  TID. Acute on chronic resp failure: Last extubated 7/20, has intermittently required BiPAP, now on 3-6L/min O2. A-fib with RVR: Refractory to DCCV (3 attempts) and amiodarone. Persistent A-fib/flutter - HR > 100. On Eliquis. Acute on chronic HFrEF: EF 20-25%. Per cardiology, not a candidate for nodal ablation, ICD, or pacemaker. Pneumococcal bacteremia/Enterobacter cloacae bacteremia. Admitted in septic shock, complex course. Last Blood cx + on 8/13 - plan was for line holiday but felt risky due to his resp status - instead just exchanged over wire on 8/14. ESRD: Usual TTS schedule. S/p CRRT 6/29-8/7. Gets extra HD at times - s/p HD 8/14, 8/16, 8/17. Now back to usual TTS schedule - for HD tomorrow, 4hr, 4-5L UF, will give IV albumin with HD for BP support. Volume: Remains volume up with hyponatremia: Lowest here is 178kg - trying to keep him there.  Reiterated fluid restrictions. Anemia of ESRD: Hgb 8.1 - continue Aranesp q Sat (max). Secondary HPTH: CorrCa high, Phos ok - no binder at this time, holding VDRA. Nutrition: Alb low, continue supplements (changed Nepro to pro-source to limit volume). Decubitus ulcerations: Occ restlessness while on HD. Prognosis: Poor. Considering [facility].  Ozzie Hoyle, PA-C 03/16/2023, 10:01 AM  [employer]

## 2023-03-16 NOTE — Progress Notes (Signed)
   03/16/23 0117  BiPAP/CPAP/SIPAP  $ Non-Invasive Home Ventilator  Subsequent  BiPAP/CPAP/SIPAP Pt Type Adult  BiPAP/CPAP/SIPAP V60  Mask Type Full face mask  Mask Size Extra large  Set Rate 18 breaths/min  Respiratory Rate 23 breaths/min  IPAP 16 cmH20  EPAP 8 cmH2O  FiO2 (%) 30 %  Minute Ventilation 18.8  Leak 25  Peak Inspiratory Pressure (PIP) 17  Tidal Volume (Vt) 808  Patient Home Equipment No  Auto Titrate No  Press High Alarm 25 cmH2O  Press Low Alarm 5 cmH2O  CPAP/SIPAP surface wiped down Yes  BiPAP/CPAP /SiPAP Vitals  SpO2 100 %

## 2023-03-17 DIAGNOSIS — J9601 Acute respiratory failure with hypoxia: Secondary | ICD-10-CM | POA: Diagnosis not present

## 2023-03-17 DIAGNOSIS — N186 End stage renal disease: Secondary | ICD-10-CM | POA: Diagnosis not present

## 2023-03-17 DIAGNOSIS — Z992 Dependence on renal dialysis: Secondary | ICD-10-CM | POA: Diagnosis not present

## 2023-03-17 LAB — CBC
HCT: 26.6 % — ABNORMAL LOW (ref 39.0–52.0)
Hemoglobin: 8.4 g/dL — ABNORMAL LOW (ref 13.0–17.0)
MCH: 32.6 pg (ref 26.0–34.0)
MCHC: 31.6 g/dL (ref 30.0–36.0)
MCV: 103.1 fL — ABNORMAL HIGH (ref 80.0–100.0)
Platelets: 251 10*3/uL (ref 150–400)
RBC: 2.58 MIL/uL — ABNORMAL LOW (ref 4.22–5.81)
RDW: 17.8 % — ABNORMAL HIGH (ref 11.5–15.5)
WBC: 8.2 10*3/uL (ref 4.0–10.5)
nRBC: 0 % (ref 0.0–0.2)

## 2023-03-17 LAB — GLUCOSE, CAPILLARY
Glucose-Capillary: 95 mg/dL (ref 70–99)
Glucose-Capillary: 104 mg/dL — ABNORMAL HIGH (ref 70–99)
Glucose-Capillary: 102 mg/dL — ABNORMAL HIGH (ref 70–99)

## 2023-03-17 LAB — RENAL FUNCTION PANEL
Albumin: 2.9 g/dL — ABNORMAL LOW (ref 3.5–5.0)
Anion gap: 15 (ref 5–15)
BUN: 58 mg/dL — ABNORMAL HIGH (ref 6–20)
CO2: 24 mmol/L (ref 22–32)
Calcium: 10.6 mg/dL — ABNORMAL HIGH (ref 8.9–10.3)
Chloride: 90 mmol/L — ABNORMAL LOW (ref 98–111)
Creatinine, Ser: 6.77 mg/dL — ABNORMAL HIGH (ref 0.61–1.24)
GFR, Estimated: 10 mL/min — ABNORMAL LOW (ref >60)
Glucose, Bld: 102 mg/dL — ABNORMAL HIGH (ref 70–99)
Phosphorus: 4.3 mg/dL (ref 2.5–4.6)
Potassium: 4.8 mmol/L (ref 3.5–5.1)
Sodium: 129 mmol/L — ABNORMAL LOW (ref 135–145)

## 2023-03-17 MED ORDER — ANTICOAGULANT SODIUM CITRATE 4% (200MG/5ML) IV SOLN
5.0000 mL | Status: DC | PRN
  Filled 2023-03-17 (×2): qty 5

## 2023-03-17 MED ORDER — ALTEPLASE 2 MG IJ SOLR: 2.0000 mg | Freq: Once | INTRAMUSCULAR | Status: DC | PRN

## 2023-03-17 NOTE — Plan of Care (Signed)
  Problem: Education: Goal: Knowledge of General Education information will improve Description: Including pain rating scale, medication(s)/side effects and non-pharmacologic comfort measures Outcome: Progressing   Problem: Health Behavior/Discharge Planning: Goal: Ability to manage health-related needs will improve Outcome: Progressing   Problem: Clinical Measurements: Goal: Ability to maintain clinical measurements within normal limits will improve Outcome: Progressing Goal: Will remain free from infection Outcome: Progressing Goal: Diagnostic test results will improve Outcome: Progressing Goal: Respiratory complications will improve Outcome: Progressing Goal: Cardiovascular complication will be avoided Outcome: Progressing   Problem: Activity: Goal: Risk for activity intolerance will decrease Outcome: Progressing   Problem: Nutrition: Goal: Adequate nutrition will be maintained Outcome: Progressing   Problem: Coping: Goal: Level of anxiety will decrease Outcome: Progressing   Problem: Elimination: Goal: Will not experience complications related to bowel motility Outcome: Progressing   Problem: Pain Managment: Goal: General experience of comfort will improve Outcome: Progressing   Problem: Skin Integrity: Goal: Risk for impaired skin integrity will decrease Outcome: Progressing   Problem: Education: Goal: Ability to describe self-care measures that may prevent or decrease complications (Diabetes Survival Skills Education) will improve Outcome: Progressing Goal: Individualized Educational Video(s) Outcome: Progressing   Problem: Coping: Goal: Ability to adjust to condition or change in health will improve Outcome: Progressing   Problem: Fluid Volume: Goal: Ability to maintain a balanced intake and output will improve Outcome: Progressing   Problem: Health Behavior/Discharge Planning: Goal: Ability to identify and utilize available resources and services  will improve Outcome: Progressing Goal: Ability to manage health-related needs will improve Outcome: Progressing   Problem: Metabolic: Goal: Ability to maintain appropriate glucose levels will improve Outcome: Progressing   Problem: Nutritional: Goal: Maintenance of adequate nutrition will improve Outcome: Progressing Goal: Progress toward achieving an optimal weight will improve Outcome: Progressing   Problem: Skin Integrity: Goal: Risk for impaired skin integrity will decrease Outcome: Progressing   Problem: Tissue Perfusion: Goal: Adequacy of tissue perfusion will improve Outcome: Progressing

## 2023-03-17 NOTE — Progress Notes (Signed)
   03/17/23 0106  BiPAP/CPAP/SIPAP  Reason BIPAP/CPAP not in use Non-compliant (pt said does not want to wear cpap at this time. PT said will call when ready.)

## 2023-03-17 NOTE — Progress Notes (Signed)
[facility] Progress Note   Subjective:  Seen in room - looks comfortable, no new concerns. For HD after lunch. LTAC placement pending.  Objective Vitals:   03/16/23 2005 03/17/23 0412 03/17/23 0830 03/17/23 1125  BP: (!) 154/117 118/69 103/70 98/78  Pulse:   (!) 115 (!) 128  Resp: 20 20 20 20   Temp: 98.7 F (37.1 C) 98.4 F (36.9 C) 97.9 F (36.6 C) 97.9 F (36.6 C)  TempSrc: Oral Oral Oral Oral  SpO2:   99% 98%  Weight:      Height:       Physical Exam General: Obese man, NAD, Nasal O2 5L/min Heart: RRR; no murmur Lungs: CTA laterally, unable to sit up for posterior exam Abdomen: obese, non-tender Extremities: no LE edema Dialysis Access: [facility] in R chest  Additional Objective Labs: Basic Metabolic Panel: Recent Labs  Lab 03/11/23 0429 03/12/23 0528 03/15/23 0640 03/16/23 0610 03/17/23 0640  NA 129*   < > 127* 130* 129*  K 4.1   < > 4.9 4.2 4.8  CL 91*   < > 89* 91* 90*  CO2 19*   < > 21* 24 24  GLUCOSE 96   < > 92 94 102*  BUN 103*   < > 79* 43* 58*  CREATININE 6.77*   < > 7.57* 5.41* 6.77*  CALCIUM 9.2   < > 9.9 10.4* 10.6*  PHOS 4.0  --  4.4  --  4.3   < > = values in this interval not displayed.   Liver Function Tests: Recent Labs  Lab 03/12/23 0528 03/14/23 0548 03/15/23 0640 03/17/23 0640  AST 56* 33  --   --   ALT 15 14  --   --   ALKPHOS 363* 332*  --   --   BILITOT 1.0 0.6  --   --   PROT 6.4* 6.8  --   --   ALBUMIN 2.5* 2.7* 2.6* 2.9*   CBC: Recent Labs  Lab 03/13/23 1321 03/14/23 0548 03/15/23 0640 03/16/23 0610 03/17/23 0640  WBC 7.0 7.8 8.9 7.8 8.2  HGB 10.1* 9.2* 8.4* 8.1* 8.4*  HCT 31.9* 28.2* 26.0* 25.9* 26.6*  MCV 102.2* 103.3* 101.6* 102.8* 103.1*  PLT 198 201 215 227 251   Medications:  sodium chloride 10 mL/hr at 02/21/23 1444   albumin human      (feeding supplement) PROSource Plus  30 mL Oral BID BM   apixaban  5 mg Oral BID   Chlorhexidine Gluconate Cloth  6 each Topical Q0600    cholecalciferol  1,000 Units Oral Daily   darbepoetin (ARANESP) injection - DIALYSIS  200 mcg Subcutaneous Q Sat-1800   digoxin  0.125 mg Oral Once per day on Monday Wednesday Friday   famotidine  10 mg Oral Daily   gabapentin  100 mg Oral TID   levofloxacin  500 mg Oral Q48H   lidocaine  1 patch Transdermal Q24H   melatonin  5 mg Oral QHS   midodrine  30 mg Oral TID WC   multivitamin  1 tablet Oral QHS   mouth rinse  15 mL Mouth Rinse 4 times per day   senna-docusate  1 tablet Oral QHS   sodium chloride flush  10-40 mL Intracatheter Q12H    Dialysis Orders: [facility]  on TTS schedule  4.25hr, EDW 180kg  425/1.5  TDC, no heparin (HIT +), lock with Na Citrate     Assessment/Plan: Acute on chronic hypotension: Able to wean off pressors on  8/3, on high dose midodrine 30mg  TID. Acute on chronic resp failure: Last extubated 7/20, has intermittently required BiPAP, now on 3-6L/min O2. A-fib with RVR: Refractory to DCCV (3 attempts) and amiodarone. Persistent A-fib/flutter - HR > 100. On Eliquis. Acute on chronic HFrEF: EF 20-25%. Per cardiology, not a candidate for nodal ablation, ICD, or pacemaker. Pneumococcal bacteremia/Enterobacter cloacae bacteremia. Admitted in septic shock, complex course. Last Blood cx + on 8/13 - plan was for line holiday but felt risky due to his resp status - instead just exchanged over wire on 8/14. ESRD: Usual TTS schedule. S/p CRRT 6/29-8/7. Gets extra HD at times - s/p HD 8/14, 8/16, 8/17. Now back to usual TTS schedule - for HD today, 4hr, 4-5L UF, will give IV albumin with HD for BP support. Volume: Remains volume up with hyponatremia: Lowest here is 178kg - trying to keep him there or lower. Reiterated fluid restrictions.  Anemia of ESRD: Hgb 8.4 - continue Aranesp q Sat (max). Secondary HPTH: CorrCa high, Phos ok - no binder at this time, holding VDRA. Nutrition: Alb low, continue supplements (changed Nepro to pro-source to limit volume). Decubitus  ulcerations: Occ restlessness while on HD. Prognosis: Poor. Considering [facility].  Ozzie Hoyle, PA-C 03/17/2023, 12:42 PM  [facility]

## 2023-03-17 NOTE — Progress Notes (Signed)
Subjective:   Summary: Phillip Young is a 37 y.o. year old male currently admitted on the IMTS HD#55 for Acute on chronic hypoxic respiratory failure, ESRD on HD.  Subjective:  Saw patient at bedside this morning. Patient endorses continued leg pain, apparently didn't receive any PRN dilaudid yesterday. Told him he could ask for it if it continues to bother him today. Plan for dialysis today, pending LTAC placement. No new changes to plan.   Objective:  Vital signs in last 24 hours: Vitals:   03/16/23 1615 03/16/23 2005 03/17/23 0412 03/17/23 0830  BP: 106/61 (!) 154/117 118/69 103/70  Pulse: 64     Resp: 20 20 20    Temp: 98.3 F (36.8 C) 98.7 F (37.1 C) 98.4 F (36.9 C) 97.9 F (36.6 C)  TempSrc: Oral Oral Oral Oral  SpO2: 98%   99%  Weight:      Height:       Supplemental O2: HFNC SpO2: 99 % O2 Flow Rate (L/min): 5 L/min FiO2 (%): 30 %   Physical Exam:  Constitutional: chronically ill appearing, laying in bed, in no acute distress.  Cardiovascular: Irregular rate and rhythm, no murmurs, rubs or gallops Pulmonary/Chest: Anterior lung exam showed decreased breath sounds bilaterally  Abdominal: soft, non-tender, non-distended Skin: warm and dry Extremities: upper/lower extremity pulses 2+, no lower extremity edema present, tenderness to palpation in bilateral lower extremities up to mid-shin.  Neuro: Decreased sensation in medial aspect of right foot, moves lower extremities spontaneously  Filed Weights   03/13/23 0500 03/14/23 0500 03/15/23 0420  Weight: (!) 178.6 kg (!) 182.4 kg (!) 182.5 kg     Intake/Output Summary (Last 24 hours) at 03/17/2023 0910 Last data filed at 03/17/2023 3664 Gross per 24 hour  Intake 480 ml  Output --  Net 480 ml   Net IO Since Admission: 4,267.08 mL [03/17/23 0910]  Pertinent Labs:    Latest Ref Rng & Units 03/17/2023    6:40 AM 03/16/2023    6:10 AM 03/15/2023    6:40 AM  CBC  WBC 4.0 - 10.5 K/uL 8.2  7.8   8.9   Hemoglobin 13.0 - 17.0 g/dL 8.4  8.1  8.4   Hematocrit 39.0 - 52.0 % 26.6  25.9  26.0   Platelets 150 - 400 K/uL 251  227  215        Latest Ref Rng & Units 03/17/2023    6:40 AM 03/16/2023    6:10 AM 03/15/2023    6:40 AM  CMP  Glucose 70 - 99 mg/dL 403  94  92   BUN 6 - 20 mg/dL 58  43  79   Creatinine 0.61 - 1.24 mg/dL 4.74  2.59  5.63   Sodium 135 - 145 mmol/L 129  130  127   Potassium 3.5 - 5.1 mmol/L 4.8  4.2  4.9   Chloride 98 - 111 mmol/L 90  91  89   CO2 22 - 32 mmol/L 24  24  21    Calcium 8.9 - 10.3 mg/dL 87.5  64.3  9.9     Imaging: No results found.   Assessment/Plan:   Principal Problem:   Acute hypoxic respiratory failure ([name]) Active Problems:   Morbid obesity ([name])   Chronic systolic CHF (congestive heart failure) ([name])   Cardiogenic shock ([name])   ESRD (end stage renal disease) ([name])   ESRD (end stage renal disease)  on dialysis [facility])   ESRD on dialysis ([name])   Hypervolemia   Sepsis with acute organ dysfunction without septic shock ([name])   Congestive heart failure ([name])   Atrial fibrillation ([name])   Pressure injury of skin   Central line infection   Patient Summary: Stepehen Young is a 57 y.o. with a pertinent PMH of chronic hypoxic respiratory failure, ESRD, HFrEF with EF 20-25%, refractory Afib, amiodarone induced lung toxicity, who presented with fevers, joint pains, SOB and admitted for sepsis, now resolved.   #Acute on chronic HFrEF with pulmonary edema #Paroxysmal Afib/flutter with RVR #Nonischemic cardiomyopathy EF less than 20% -Remains in and out of Afib with HR ranging up to 150s -Patient intolerant of metoprolol and carvedilol due to allergy, amiodarone due to lung toxicity, diltiazem due to extremely reduced EF -Cardiology signed off, however recommending digoxin every other day (non-dialysis days) and frequent digoxin levels. Appreciate recs -Continue Eliquis for stroke prophylaxis -Palliative consulted, no new recs from previous  consultation. Appreciate assistance -TOC consulted for [facility] placement, pending authorization  #ESRD on HD T/Th/S #Recurrent metabolic acidosis, improving S/P TDC repair on 8/14, has been receiving dialysis. Per notation, patient has consistently required midodrine and haldol in order to tolerate HD. HD schedule T/Th/Sa -Nephrology following, appreciate assistance -IR replaced [facility] 8/14, appreciate assistance. -Patient has had trouble tolerating due to skin breakdown and TDC clotting. Haldol as needed for agitation -Wound care reconsulted for ongoing skin breakdown making patient intolerant of dialysis. No further recs -Midodrine 30 TID for hypotension  #Acute on chronic hypoxemic/hypercapnic respiratory failure #Acute metabolic encephalopathy in setting of hypercapnia Patient was transferred from ICU on 8/9 after extubation and pressors were discontinued. Mental status has improved, patient is currently alert and following commands. Patient is questionably adherent with home CPAP -6L HFNC with O2 sats in mid 90s -Encourage incentive spirometry and PT/OT  #Bacteremia Blood cultures on 8/15 positive for enterobacter cloacae. WBC count has resolved to normal limits at. Unable to tolerate line holiday given needs for dialysis.  -Blood culture sensitivities reported, levoquin started 8/19 per pharmacy -Repeat BC NGTD  #Hypervolemic hyponatremia #Hyperphosphatemia #Hyperkalemia -Recurrent hyperkalemia. Treating as needed with lokelma, insulin/D50, albuterol, calcium gluconate -Monitor electrolytes with daily labs, supplement as necessary -Tube feeds d/c 8/12. Monitor blood glucose with frequent checks  #Anemia of chronic disease Monitor with daily labs, transfuse as needed  #Skin breakdown Perineal and sacral skin breakdown. Patient refuses rectal tube -Continue local wound care -Wound care reconsulted, no new recs  #Lower Extremity Numbness  Pt states he has been having burning  pain in his feet for the last two weeks. On exam, there is decreased sensation in the medial aspect of the right foot, but sensation is normal on the left foot. Last A1c four months ago was 5.6, less concern for diabetic neuropathy.  - Resume home gabapentin - Dilaudid 1mg  q8 PRN  #Mixed Septic and Cardiogenic Shock -Resolved, transferred to floor from ICU on 8/9  Diet: Normal IVF: None,   VTE: DOAC Code: Full  Dispo: Anticipated discharge to Skilled nursing facility pending medical stability Monna Fam, MD PGY-1 Internal Medicine Resident Pager Number [phone] Please contact the on call pager after 5 pm and on weekends at [phone].

## 2023-03-17 NOTE — Procedures (Signed)
HD Note:  Some information was entered later than the data was gathered due to patient care needs. The stated time with the data is accurate.  Received patient in bed to unit.   Alert and oriented.   Informed consent signed and in chart.   Access used: right upper chest HD catheter Access issues: Lines had to be reversed, no issues after that was completed.  Patient tolerated treatment well.    Hand-off given to oncoming dialysis nurse.   Freddi Forster L. Dareen Piano, RN [facility].

## 2023-03-17 NOTE — Progress Notes (Signed)
Physical Therapy Treatment Patient Details Name: Phillip Young MRN: [id number] DOB: September 23, 1985 Today's Date: 03/17/2023   History of Present Illness Pt is a 37 y/o M presenting to ED on 6/28 with fever, joint pain, and SOB after missing HD. Admitted for acute hypoxic respiratory failure. Intubated and started on CRRT, vasopressor support on 6/30. Extubated 7/20. Underwent DCCV 7/5, 7/6, and 7/7 without success. Pt off CRRT on 8/7.  PMH includes ESRD, NICM, refractory A fib, HFrEF    PT Comments  Pt received in supine and agreeable to session. Pt reports BLE stiffness and continues B feet burning. Pt able to perform supine exercises with limited ROM and tendency for B hip external rotation. Pt demonstrates difficulty with pillow adduction squeeze due to weakness. Began to don tilt bed straps to focus on increasing BLE WB, however transport for HD arrived, so session was limited. Pt continues to benefit from PT services to progress toward functional mobility goals.    If plan is discharge home, recommend the following: Two people to help with walking and/or transfers;Two people to help with bathing/dressing/bathroom;Direct supervision/assist for medications management;Direct supervision/assist for financial management;Assist for transportation;Help with stairs or ramp for entrance   Can travel by private vehicle     No  Equipment Recommendations  Other (comment) (TBD next venue)    Recommendations for Other Services       Precautions / Restrictions Precautions Precautions: Fall Precaution Comments: wounds on hip/back/buttocks Restrictions Weight Bearing Restrictions: No     Mobility  Bed Mobility               General bed mobility comments: Pt performing exercises in supine        Balance                                            Cognition Arousal: Alert Behavior During Therapy: WFL for tasks assessed/performed Overall Cognitive Status:  Impaired/Different from baseline                           Safety/Judgement: Decreased awareness of deficits              Exercises Total Joint Exercises Towel Squeeze: AROM, Supine, Both, 5 reps General Exercises - Lower Extremity Heel Slides: AAROM, Supine, Both, 5 reps    General Comments        Pertinent Vitals/Pain Pain Assessment Pain Assessment: Faces Faces Pain Scale: Hurts even more Pain Location: buttocks, B feet/ankles Pain Descriptors / Indicators: Discomfort, Aching, Grimacing, Burning Pain Intervention(s): Limited activity within patient's tolerance, Monitored during session, Repositioned     PT Goals (current goals can now be found in the care plan section) Acute Rehab PT Goals Patient Stated Goal: Not stated PT Goal Formulation: With patient Time For Goal Achievement: 03/07/23 Potential to Achieve Goals: Fair Progress towards PT goals: Progressing toward goals    Frequency    Min 1X/week      PT Plan         AM-PAC PT "6 Clicks" Mobility   Outcome Measure  Help needed turning from your back to your side while in a flat bed without using bedrails?: Total Help needed moving from lying on your back to sitting on the side of a flat bed without using bedrails?: Total Help needed moving to and from a bed to a chair (  including a wheelchair)?: Total Help needed standing up from a chair using your arms (e.g., wheelchair or bedside chair)?: Total Help needed to walk in hospital room?: Total Help needed climbing 3-5 steps with a railing? : Total 6 Click Score: 6    End of Session Equipment Utilized During Treatment: Oxygen Activity Tolerance: Patient limited by pain;Other (comment) (limited by transport arriving) Patient left: in bed;with call bell/phone within reach;with nursing/sitter in room Nurse Communication: Mobility status PT Visit Diagnosis: Muscle weakness (generalized) (M62.81);Other abnormalities of gait and mobility  (R26.89)     Time: 5784-6962 PT Time Calculation (min) (ACUTE ONLY): 21 min  Charges:    $Therapeutic Exercise: 8-22 mins PT General Charges $$ ACUTE PT VISIT: 1 Visit                     Phillip Young, PTA Acute Rehabilitation Services Secure Chat Preferred  [phone]    Phillip Young 03/17/2023, 3:24 PM

## 2023-03-17 NOTE — Progress Notes (Signed)
RT at bedside to assess pt. RT found pt on [place] with BiPAP (V60) on stby at bedside.

## 2023-03-18 DIAGNOSIS — J9601 Acute respiratory failure with hypoxia: Secondary | ICD-10-CM | POA: Diagnosis not present

## 2023-03-18 DIAGNOSIS — Z992 Dependence on renal dialysis: Secondary | ICD-10-CM | POA: Diagnosis not present

## 2023-03-18 DIAGNOSIS — N186 End stage renal disease: Secondary | ICD-10-CM | POA: Diagnosis not present

## 2023-03-18 DIAGNOSIS — L89159 Pressure ulcer of sacral region, unspecified stage: Secondary | ICD-10-CM | POA: Diagnosis not present

## 2023-03-18 LAB — RENAL FUNCTION PANEL
Albumin: 2.9 g/dL — ABNORMAL LOW (ref 3.5–5.0)
Anion gap: 15 (ref 5–15)
BUN: 35 mg/dL — ABNORMAL HIGH (ref 6–20)
CO2: 25 mmol/L (ref 22–32)
Calcium: 10.3 mg/dL (ref 8.9–10.3)
Chloride: 88 mmol/L — ABNORMAL LOW (ref 98–111)
Creatinine, Ser: 5.04 mg/dL — ABNORMAL HIGH (ref 0.61–1.24)
GFR, Estimated: 14 mL/min — ABNORMAL LOW (ref >60)
Glucose, Bld: 113 mg/dL — ABNORMAL HIGH (ref 70–99)
Phosphorus: 3.4 mg/dL (ref 2.5–4.6)
Potassium: 3.9 mmol/L (ref 3.5–5.1)
Sodium: 128 mmol/L — ABNORMAL LOW (ref 135–145)

## 2023-03-18 LAB — COOXEMETRY PANEL
Carboxyhemoglobin: 2.4 % — ABNORMAL HIGH (ref 0.5–1.5)
Methemoglobin: <0.7 % (ref 0.0–1.5)
O2 Saturation: 77.4 %
Total hemoglobin: 8.6 g/dL — ABNORMAL LOW (ref 12.0–16.0)

## 2023-03-18 LAB — CBC
HCT: 25.8 % — ABNORMAL LOW (ref 39.0–52.0)
Hemoglobin: 8.1 g/dL — ABNORMAL LOW (ref 13.0–17.0)
MCH: 32.4 pg (ref 26.0–34.0)
MCHC: 31.4 g/dL (ref 30.0–36.0)
MCV: 103.2 fL — ABNORMAL HIGH (ref 80.0–100.0)
Platelets: 248 10*3/uL (ref 150–400)
RBC: 2.5 MIL/uL — ABNORMAL LOW (ref 4.22–5.81)
RDW: 18 % — ABNORMAL HIGH (ref 11.5–15.5)
WBC: 7.2 10*3/uL (ref 4.0–10.5)
nRBC: 0 % (ref 0.0–0.2)

## 2023-03-18 LAB — GLUCOSE, CAPILLARY
Glucose-Capillary: 111 mg/dL — ABNORMAL HIGH (ref 70–99)
Glucose-Capillary: 114 mg/dL — ABNORMAL HIGH (ref 70–99)
Glucose-Capillary: 152 mg/dL — ABNORMAL HIGH (ref 70–99)
Glucose-Capillary: 125 mg/dL — ABNORMAL HIGH (ref 70–99)
Glucose-Capillary: 154 mg/dL — ABNORMAL HIGH (ref 70–99)
Glucose-Capillary: 125 mg/dL — ABNORMAL HIGH (ref 70–99)

## 2023-03-18 MED ORDER — HYDROMORPHONE HCL 2 MG PO TABS
2.0000 mg | ORAL_TABLET | Freq: Four times a day (QID) | ORAL | Status: DC | PRN
  Administered 2023-03-18 – 2023-03-19 (×3): 2 mg via ORAL
  Filled 2023-03-18 (×3): qty 1

## 2023-03-18 NOTE — Consult Note (Addendum)
WOC Nurse Consult Note: Consult requested by patient for left posterior thigh wound.  He is familiar to [facility] team from multiple previous consults; refer to the EMR for notes, most recent visit was 8/14. Pt is on a low airloss mattress to reduce pressure.   Left posterior thigh is a full thickness wound which has evolved into 3 deeper tunneling areas in the wound with increased drainage, according to the patient.   11X10X2cm, 80% red, 20% yellow, 3 areas with greater depth are separated by narrow bridges of skin and have mod amt yellow drainage. They do not communicate below the skin level at this time, but may eventually.  Topical treatment orders provided for bedside nurses to perform as follows to absorb drainage and provide antimicrobial benifits: Cut 3 narrow strips of Aquacel  [place] [id number]) and insert into deeper areas of right thigh wound Q day, using swab to fill, then apply another piece of Aquacel over the top and cover with foam dressing. Change foam dressing Q 3 days or PRN soiling.  Please re-consult if further assistance is needed.  Thank-you,  Cammie Mcgee MSN, RN, CWOCN, [place], CNS [phone]

## 2023-03-18 NOTE — Plan of Care (Signed)
  Problem: Education: Goal: Knowledge of General Education information will improve Description: Including pain rating scale, medication(s)/side effects and non-pharmacologic comfort measures Outcome: Progressing   Problem: Health Behavior/Discharge Planning: Goal: Ability to manage health-related needs will improve Outcome: Progressing   Problem: Clinical Measurements: Goal: Ability to maintain clinical measurements within normal limits will improve Outcome: Progressing Goal: Will remain free from infection Outcome: Progressing Goal: Diagnostic test results will improve Outcome: Progressing Goal: Respiratory complications will improve Outcome: Progressing Goal: Cardiovascular complication will be avoided Outcome: Progressing   Problem: Activity: Goal: Risk for activity intolerance will decrease Outcome: Progressing   Problem: Nutrition: Goal: Adequate nutrition will be maintained Outcome: Progressing   Problem: Coping: Goal: Level of anxiety will decrease Outcome: Progressing   Problem: Elimination: Goal: Will not experience complications related to bowel motility Outcome: Progressing   Problem: Pain Managment: Goal: General experience of comfort will improve Outcome: Progressing   Problem: Skin Integrity: Goal: Risk for impaired skin integrity will decrease Outcome: Progressing   Problem: Education: Goal: Ability to describe self-care measures that may prevent or decrease complications (Diabetes Survival Skills Education) will improve Outcome: Progressing Goal: Individualized Educational Video(s) Outcome: Progressing   Problem: Coping: Goal: Ability to adjust to condition or change in health will improve Outcome: Progressing   Problem: Fluid Volume: Goal: Ability to maintain a balanced intake and output will improve Outcome: Progressing   Problem: Health Behavior/Discharge Planning: Goal: Ability to identify and utilize available resources and services  will improve Outcome: Progressing Goal: Ability to manage health-related needs will improve Outcome: Progressing   Problem: Metabolic: Goal: Ability to maintain appropriate glucose levels will improve Outcome: Progressing   Problem: Nutritional: Goal: Maintenance of adequate nutrition will improve Outcome: Progressing Goal: Progress toward achieving an optimal weight will improve Outcome: Progressing   Problem: Skin Integrity: Goal: Risk for impaired skin integrity will decrease Outcome: Progressing   Problem: Tissue Perfusion: Goal: Adequacy of tissue perfusion will improve Outcome: Progressing

## 2023-03-18 NOTE — Progress Notes (Signed)
Palliative care following in chart for needs, will not re-engage in terms of goals of care which are established - he is awaiting LTACH placement. Chart reviewed.   Neuropathic pain in his bilateral LE persists. Recommend metabolic and vascular evaluation of his lower extremity neuropathy may have some neurogenic claudication or entrapment syndrome. Additionally check T4,T3 and TSH, B12 and RPR. He needs long acting pain control or scheduled low dose opioid with IV prn used only for breakthrough pain. He would do best with very low dose methadone 2.5 BID, especially on HD but this may be limiting in the future for him due to prescribing challenges- recommend starting hydromorphone PO scheduled 2mg  every 4 hours and leave prn. Agree with gabapentin adjuvant. Also consider scheduling Tylenol 1000mg  TID. Please call with questions regarding recommendations or for any additional palliative care needs.  Anderson Malta, DO Palliative Medicine

## 2023-03-18 NOTE — Progress Notes (Signed)
Subjective:   Summary: Phillip Young is a 37 y.o. year old male currently admitted on the IMTS HD#56 for Acute on chronic hypoxic respiratory failure, ESRD on HD.  Subjective:  Saw patient at bedside this morning. Patient states the dilaudid he received yesterday helped his foot pain but didn't last very long. He really appreciates his heating pad. No new complaints. Unchanged status, awaiting LTAC placement  Objective:  Vital signs in last 24 hours: Vitals:   03/17/23 2035 03/18/23 0350 03/18/23 0524 03/18/23 0822  BP: 106/71 111/68    Pulse: 97     Resp: 20 12    Temp: 97.9 F (36.6 C) 98.3 F (36.8 C)  97.9 F (36.6 C)  TempSrc: Oral Oral  Oral  SpO2: 100%   100%  Weight:   (!) 175.9 kg   Height:       Supplemental O2: HFNC SpO2: 100 % O2 Flow Rate (L/min): 6 L/min FiO2 (%): 30 %   Physical Exam:  Constitutional: chronically ill appearing, laying in bed, in no acute distress.  Cardiovascular: Irregular rate and rhythm, no murmurs, rubs or gallops Pulmonary/Chest: Anterior lung exam showed decreased breath sounds bilaterally  Abdominal: soft, non-tender, non-distended Skin: warm and dry Extremities: upper/lower extremity pulses 2+, no lower extremity edema present, tenderness to palpation in bilateral lower extremities up to mid-shin.  Neuro: Decreased sensation in medial aspect of right foot, moves lower extremities spontaneously  Filed Weights   03/15/23 0420 03/18/23 0524  Weight: (!) 182.5 kg (!) 175.9 kg     Intake/Output Summary (Last 24 hours) at 03/18/2023 0846 Last data filed at 03/18/2023 0600 Gross per 24 hour  Intake 590 ml  Output 5000 ml  Net -4410 ml   Net IO Since Admission: -142.92 mL [03/18/23 0846]  Pertinent Labs:    Latest Ref Rng & Units 03/18/2023    5:50 AM 03/17/2023    6:40 AM 03/16/2023    6:10 AM  CBC  WBC 4.0 - 10.5 K/uL 7.2  8.2  7.8   Hemoglobin 13.0 - 17.0 g/dL 8.1  8.4  8.1   Hematocrit 39.0 - 52.0 %  25.8  26.6  25.9   Platelets 150 - 400 K/uL 248  251  227        Latest Ref Rng & Units 03/18/2023    5:50 AM 03/17/2023    6:40 AM 03/16/2023    6:10 AM  CMP  Glucose 70 - 99 mg/dL 213  086  94   BUN 6 - 20 mg/dL 35  58  43   Creatinine 0.61 - 1.24 mg/dL 5.78  4.69  6.29   Sodium 135 - 145 mmol/L 128  129  130   Potassium 3.5 - 5.1 mmol/L 3.9  4.8  4.2   Chloride 98 - 111 mmol/L 88  90  91   CO2 22 - 32 mmol/L 25  24  24    Calcium 8.9 - 10.3 mg/dL 52.8  41.3  24.4     Imaging: No results found.   Assessment/Plan:   Principal Problem:   Acute hypoxic respiratory failure ([name]) Active Problems:   Morbid obesity ([name])   Chronic systolic CHF (congestive heart failure) ([name])   Cardiogenic shock ([name])   ESRD (end stage renal disease) ([name])   ESRD (end stage renal disease) on dialysis ([name])   ESRD on dialysis ([name])   Hypervolemia   Sepsis  with acute organ dysfunction without septic shock ([name])   Congestive heart failure ([name])   Atrial fibrillation ([name])   Pressure injury of skin   Central line infection   Patient Summary: Phillip Young is a 37 y.o. with a pertinent PMH of chronic hypoxic respiratory failure, ESRD, HFrEF with EF 20-25%, refractory Afib, amiodarone induced lung toxicity, who presented with fevers, joint pains, SOB and admitted for sepsis, now resolved.   #Acute on chronic HFrEF with pulmonary edema #Paroxysmal Afib/flutter with RVR #Nonischemic cardiomyopathy EF less than 20% -Remains in and out of Afib with HR ranging up to 150s -Patient intolerant of metoprolol and carvedilol due to allergy, amiodarone due to lung toxicity, diltiazem due to extremely reduced EF -Cardiology signed off, however recommending digoxin every other day (non-dialysis days) and frequent digoxin levels. Appreciate recs -Continue Eliquis for stroke prophylaxis -Palliative consulted, no new recs from previous consultation. Appreciate assistance -TOC consulted for [facility] placement,  pending authorization  #ESRD on HD T/Th/S #Recurrent metabolic acidosis, improving S/P TDC repair on 8/14, has been receiving dialysis. Per notation, patient has consistently required midodrine and haldol in order to tolerate HD. HD schedule T/Th/Sa -Nephrology following, appreciate assistance -IR replaced [facility] 8/14, appreciate assistance. -Patient has had trouble tolerating due to skin breakdown and TDC clotting. Haldol as needed for agitation -Wound care reconsulted for ongoing skin breakdown making patient intolerant of dialysis. No further recs -Midodrine 30 TID for hypotension  #Acute on chronic hypoxemic/hypercapnic respiratory failure #Acute metabolic encephalopathy in setting of hypercapnia Patient was transferred from ICU on 8/9 after extubation and pressors were discontinued. Mental status has improved, patient is currently alert and following commands. Patient is questionably adherent with home CPAP -6L HFNC with O2 sats in mid 90s -Encourage incentive spirometry and PT/OT  #Hypervolemic hyponatremia #Hyperphosphatemia #Hyperkalemia -Recurrent hyperkalemia. Treating as needed with lokelma, insulin/D50, albuterol, calcium gluconate -Monitor electrolytes with daily labs, supplement as necessary -Tube feeds d/c 8/12. Monitor blood glucose with frequent checks  #Anemia of chronic disease Monitor with daily labs, transfuse as needed  #Skin breakdown Perineal and sacral skin breakdown. Patient refuses rectal tube -Continue local wound care -Wound care reconsulted, no new recs  #Lower Extremity Numbness  Pt states he has been having burning pain in his feet for the last two weeks. On exam, there is decreased sensation in the medial aspect of the right foot, but sensation is normal on the left foot. Last A1c four months ago was 5.6, less concern for diabetic neuropathy.  - Resume home gabapentin - Dilaudid 1mg  q8 PRN  #Bacteremia, resolved Blood cultures on 8/15 positive for  enterobacter cloacae. WBC count has resolved to normal limits at. Unable to tolerate line holiday given needs for dialysis. Repeat blood cultures showing no growth. Treated with cefepime starting 8/14, transitioned to levaquin 8/20, abx stopped 8/23   #Mixed Septic and Cardiogenic Shock -Resolved, transferred to floor from ICU on 8/9  Diet: Normal IVF: None,   VTE: DOAC Code: Full  Dispo: Anticipated discharge to Skilled nursing facility pending medical stability Monna Fam, MD PGY-1 Internal Medicine Resident Pager Number [phone] Please contact the on call pager after 5 pm and on weekends at [phone].

## 2023-03-18 NOTE — Progress Notes (Signed)
Subjective:  eating  Lunch , no sob or cos , tolerated 5l uf hd yest , wants to attempt 5.5 l uf tomor hd   Objective Vital signs in last 24 hours: Vitals:   03/18/23 0350 03/18/23 0524 03/18/23 0822 03/18/23 1223  BP: 111/68  114/85 108/64  Pulse:   (!) 114 78  Resp: 12  20 20   Temp: 98.3 F (36.8 C)  97.9 F (36.6 C) 98.2 F (36.8 C)  TempSrc: Oral  Oral Oral  SpO2:   100% 100%  Weight:  (!) 175.9 kg    Height:       Weight change:   Physical Exam: General: obese, young male  nad  [place] O2  5L/min  Heart: RRR, no mrg Lungs: CTA bilat ant Non labored  on 5L  [place] O2 Abdomen: Obese, abd wall edema, NABS  Soft , NT, ND Extremities: 1+ bipedal edema Dialysis Access: R internal jugular TDC     Dialysis Orders: [facility]  on TTS schedule  4.25hr, EDW 180kg  425/1.5  TDC, no heparin (HIT +), lock with Na Citrate  Problem/Plan: Acute on chronic hypotension: Able to wean off pressors on 8/3, on high dose midodrine 30mg  TID. Acute on chronic resp failure: Last extubated 7/20, has intermittently required BiPAP, now on 3-6L/min O2. A-fib with RVR: Refractory to DCCV (3 attempts) and amiodarone. Persistent A-fib/flutter - HR > 100. On Eliquis. Acute on chronic HFrEF: EF 20-25%. Per cardiology, not a candidate for nodal ablation, ICD, or pacemaker. Pneumococcal bacteremia/Enterobacter cloacae bacteremia. Admitted in septic shock, complex course. Last Blood cx + on 8/13 - plan was for line holiday but felt risky due to his resp status - instead just exchanged over wire on 8/14. ESRD: Usual TTS schedule. S/p CRRT 6/29-8/7. Gets extra HD at times - s/p HD 8/14, 8/16, 8/17. Now back to usual TTS schedule - for HD today, 4hr, 4-5L UF, will give IV albumin with HD for BP support. Volume: Remains volume up with hyponatremia: NA 128 / - trying to keep him at wt 178 kg there or lower. Reiterated fluid restrictions.  Anemia of ESRD: Hgb 8.4 - continue Aranesp q Sat (max). Secondary HPTH: CorrCa high,  Phos 3.4 - no binder at this time, holding VDRA. Nutrition: Alb 2.9 , continue supplements (changed Nepro to pro-source to limit volume). Decubitus ulcerations: Occ restlessness while on HD. Prognosis: Poor. Considering [facility].   Lenny Pastel, PA-C [facility] [phone] 03/18/2023,2:36 PM  LOS: 56 days   Labs: Basic Metabolic Panel: Recent Labs  Lab 03/15/23 0640 03/16/23 0610 03/17/23 0640 03/18/23 0550  NA 127* 130* 129* 128*  K 4.9 4.2 4.8 3.9  CL 89* 91* 90* 88*  CO2 21* 24 24 25   GLUCOSE 92 94 102* 113*  BUN 79* 43* 58* 35*  CREATININE 7.57* 5.41* 6.77* 5.04*  CALCIUM 9.9 10.4* 10.6* 10.3  PHOS 4.4  --  4.3 3.4   Liver Function Tests: Recent Labs  Lab 03/12/23 0528 03/14/23 0548 03/15/23 0640 03/17/23 0640 03/18/23 0550  AST 56* 33  --   --   --   ALT 15 14  --   --   --   ALKPHOS 363* 332*  --   --   --   BILITOT 1.0 0.6  --   --   --   PROT 6.4* 6.8  --   --   --   ALBUMIN 2.5* 2.7* 2.6* 2.9* 2.9*   No results for  input(s): "LIPASE", "AMYLASE" in the last 168 hours. No results for input(s): "AMMONIA" in the last 168 hours. CBC: Recent Labs  Lab 03/14/23 0548 03/15/23 0640 03/16/23 0610 03/17/23 0640 03/18/23 0550  WBC 7.8 8.9 7.8 8.2 7.2  HGB 9.2* 8.4* 8.1* 8.4* 8.1*  HCT 28.2* 26.0* 25.9* 26.6* 25.8*  MCV 103.3* 101.6* 102.8* 103.1* 103.2*  PLT 201 215 227 251 248   Cardiac Enzymes: No results for input(s): "CKTOTAL", "CKMB", "CKMBINDEX", "TROPONINI" in the last 168 hours. CBG: Recent Labs  Lab 03/17/23 1216 03/18/23 0143 03/18/23 0523 03/18/23 0827 03/18/23 1225  GLUCAP 102* 111* 114* 152* 125*    Studies/Results: No results found. Medications:  sodium chloride 10 mL/hr at 02/21/23 1444   albumin human 25 g (03/17/23 1541)    (feeding supplement) PROSource Plus  30 mL Oral BID BM   apixaban  5 mg Oral BID   Chlorhexidine Gluconate Cloth  6 each Topical Q0600   cholecalciferol  1,000 Units Oral  Daily   darbepoetin (ARANESP) injection - DIALYSIS  200 mcg Subcutaneous Q Sat-1800   digoxin  0.125 mg Oral Once per day on Monday Wednesday Friday   famotidine  10 mg Oral Daily   gabapentin  100 mg Oral TID   lidocaine  1 patch Transdermal Q24H   melatonin  5 mg Oral QHS   midodrine  30 mg Oral TID WC   multivitamin  1 tablet Oral QHS   mouth rinse  15 mL Mouth Rinse 4 times per day   senna-docusate  1 tablet Oral QHS   sodium chloride flush  10-40 mL Intracatheter Q12H

## 2023-03-18 NOTE — Progress Notes (Signed)
Physical Therapy Treatment Patient Details Name: Phillip Young MRN: [id number] DOB: 18-Jul-1986 Today's Date: 03/18/2023   History of Present Illness Pt is a 37 y/o M presenting to ED on 6/28 with fever, joint pain, and SOB after missing HD. Admitted for acute hypoxic respiratory failure. Intubated and started on CRRT, vasopressor support on 6/30. Extubated 7/20. Underwent DCCV 7/5, 7/6, and 7/7 without success. Pt off CRRT on 8/7.  PMH includes ESRD, NICM, refractory A fib, HFrEF    PT Comments  Pt received in supine and agreeable to session. Pt reporting lightheadedness from recent pain medicine, so session focused on supine exercises for safety. Pt demonstrating weakness with BLE hip adductors requiring cues and intermittent assist to prevent hip external rotation during exercises. Pt able to improve technique with cues. Pt encouraged to perform exercises independently for strengthening. Pt able to roll with mod A to adjust bedpads and assist with positioning in bed. Pt left positioned with pillows preventing external rotation for BLE. Pt continues to benefit from PT services to progress toward functional mobility goals.     If plan is discharge home, recommend the following: Two people to help with walking and/or transfers;Two people to help with bathing/dressing/bathroom;Direct supervision/assist for medications management;Direct supervision/assist for financial management;Assist for transportation;Help with stairs or ramp for entrance   Can travel by private vehicle     No  Equipment Recommendations  Other (comment) (TBD next venue)    Recommendations for Other Services       Precautions / Restrictions Precautions Precautions: Fall Precaution Comments: wounds on hip/back/buttocks Restrictions Weight Bearing Restrictions: No     Mobility  Bed Mobility Overal bed mobility: Needs Assistance Bed Mobility: Rolling Rolling: Mod assist         General bed mobility comments: Mod A  for bedpad adjustment        Balance Overall balance assessment: Needs assistance     Sitting balance - Comments: NT this session                                    Cognition Arousal: Alert Behavior During Therapy: WFL for tasks assessed/performed Overall Cognitive Status: Within Functional Limits for tasks assessed                                          Exercises Total Joint Exercises Towel Squeeze: AROM, Supine, Both, 5 reps General Exercises - Lower Extremity Quad Sets: AROM, Both, Supine, 5 reps Heel Slides: AAROM, Supine, Both, 5 reps Hip ABduction/ADduction: Supine, Both, AAROM, 5 reps    General Comments        Pertinent Vitals/Pain Pain Assessment Pain Assessment: Faces Faces Pain Scale: Hurts little more Pain Location: buttocks, B feet/ankles Pain Descriptors / Indicators: Discomfort, Aching, Grimacing, Burning Pain Intervention(s): Limited activity within patient's tolerance, Monitored during session, Repositioned     PT Goals (current goals can now be found in the care plan section) Acute Rehab PT Goals Patient Stated Goal: Not stated PT Goal Formulation: With patient Time For Goal Achievement: 03/07/23 Potential to Achieve Goals: Fair Progress towards PT goals: Progressing toward goals    Frequency    Min 1X/week      PT Plan         AM-PAC PT "6 Clicks" Mobility   Outcome Measure  Help needed turning  from your back to your side while in a flat bed without using bedrails?: Total Help needed moving from lying on your back to sitting on the side of a flat bed without using bedrails?: Total Help needed moving to and from a bed to a chair (including a wheelchair)?: Total Help needed standing up from a chair using your arms (e.g., wheelchair or bedside chair)?: Total Help needed to walk in hospital room?: Total Help needed climbing 3-5 steps with a railing? : Total 6 Click Score: 6    End of Session  Equipment Utilized During Treatment: Oxygen Activity Tolerance: Patient limited by pain;Other (comment) (dizziness) Patient left: in bed;with call bell/phone within reach;with nursing/sitter in room Nurse Communication: Mobility status PT Visit Diagnosis: Muscle weakness (generalized) (M62.81);Other abnormalities of gait and mobility (R26.89)     Time: 1540-1611 PT Time Calculation (min) (ACUTE ONLY): 31 min  Charges:    $Therapeutic Exercise: 23-37 mins PT General Charges $$ ACUTE PT VISIT: 1 Visit                     Johny Shock, PTA Acute Rehabilitation Services Secure Chat Preferred  [phone]    Johny Shock 03/18/2023, 4:20 PM

## 2023-03-18 NOTE — Progress Notes (Signed)
Patient refused Eliquis this pm, stated "it cuts me up when I have a bowel movement and I feel like I have to have one tonight". Patient educated on the benefits and risks of refusing the medication. Patient also educated on the medication not affecting bowel movements. Will notify [facility] about the Red Med refusal. Will continue to monitor.

## 2023-03-18 NOTE — Progress Notes (Signed)
   03/18/23 0147  BiPAP/CPAP/SIPAP  BiPAP/CPAP/SIPAP Pt Type Adult  BiPAP/CPAP/SIPAP V60  Reason BIPAP/CPAP not in use Non-compliant  Mask Type Full face mask  Mask Size Extra large

## 2023-03-19 LAB — RENAL FUNCTION PANEL
Albumin: 3.2 g/dL — ABNORMAL LOW (ref 3.5–5.0)
Anion gap: 18 — ABNORMAL HIGH (ref 5–15)
BUN: 48 mg/dL — ABNORMAL HIGH (ref 6–20)
CO2: 21 mmol/L — ABNORMAL LOW (ref 22–32)
Calcium: 10.6 mg/dL — ABNORMAL HIGH (ref 8.9–10.3)
Chloride: 88 mmol/L — ABNORMAL LOW (ref 98–111)
Creatinine, Ser: 6.37 mg/dL — ABNORMAL HIGH (ref 0.61–1.24)
GFR, Estimated: 11 mL/min — ABNORMAL LOW (ref >60)
Glucose, Bld: 96 mg/dL (ref 70–99)
Phosphorus: 5.3 mg/dL — ABNORMAL HIGH (ref 2.5–4.6)
Potassium: 5.4 mmol/L — ABNORMAL HIGH (ref 3.5–5.1)
Sodium: 127 mmol/L — ABNORMAL LOW (ref 135–145)

## 2023-03-19 LAB — GLUCOSE, CAPILLARY
Glucose-Capillary: 110 mg/dL — ABNORMAL HIGH (ref 70–99)
Glucose-Capillary: 111 mg/dL — ABNORMAL HIGH (ref 70–99)
Glucose-Capillary: 125 mg/dL — ABNORMAL HIGH (ref 70–99)
Glucose-Capillary: 130 mg/dL — ABNORMAL HIGH (ref 70–99)
Glucose-Capillary: 152 mg/dL — ABNORMAL HIGH (ref 70–99)

## 2023-03-19 LAB — CBC
HCT: 29.1 % — ABNORMAL LOW (ref 39.0–52.0)
Hemoglobin: 9.1 g/dL — ABNORMAL LOW (ref 13.0–17.0)
MCH: 32.3 pg (ref 26.0–34.0)
MCHC: 31.3 g/dL (ref 30.0–36.0)
MCV: 103.2 fL — ABNORMAL HIGH (ref 80.0–100.0)
Platelets: 264 10*3/uL (ref 150–400)
RBC: 2.82 MIL/uL — ABNORMAL LOW (ref 4.22–5.81)
RDW: 17.9 % — ABNORMAL HIGH (ref 11.5–15.5)
WBC: 9.1 10*3/uL (ref 4.0–10.5)
nRBC: 0 % (ref 0.0–0.2)

## 2023-03-19 MED ORDER — ANTICOAGULANT SODIUM CITRATE 4% (200MG/5ML) IV SOLN
5.0000 mL | Freq: Once | Status: AC
  Administered 2023-03-19: 3.8 mL
  Filled 2023-03-19: qty 5

## 2023-03-19 MED ORDER — SODIUM ZIRCONIUM CYCLOSILICATE 5 G PO PACK
5.0000 g | PACK | Freq: Once | ORAL | Status: AC
  Administered 2023-03-19: 5 g via ORAL
  Filled 2023-03-19: qty 1

## 2023-03-19 MED ORDER — ALBUMIN HUMAN 25 % IV SOLN
25.0000 g | INTRAVENOUS | Status: DC
  Filled 2023-03-19 (×2): qty 100

## 2023-03-19 MED ORDER — HYDROMORPHONE HCL 2 MG PO TABS
2.0000 mg | ORAL_TABLET | ORAL | Status: DC | PRN
  Administered 2023-03-19 – 2023-03-21 (×11): 2 mg via ORAL
  Filled 2023-03-19 (×11): qty 1

## 2023-03-19 MED ORDER — SEVELAMER CARBONATE 800 MG PO TABS
1600.0000 mg | ORAL_TABLET | Freq: Three times a day (TID) | ORAL | Status: DC
  Administered 2023-03-19 – 2023-03-23 (×12): 1600 mg via ORAL
  Filled 2023-03-19 (×14): qty 2

## 2023-03-19 NOTE — Plan of Care (Signed)
  Problem: Education: Goal: Knowledge of General Education information will improve Description: Including pain rating scale, medication(s)/side effects and non-pharmacologic comfort measures Outcome: Progressing   Problem: Health Behavior/Discharge Planning: Goal: Ability to manage health-related needs will improve Outcome: Progressing   Problem: Clinical Measurements: Goal: Ability to maintain clinical measurements within normal limits will improve Outcome: Progressing Goal: Will remain free from infection Outcome: Progressing Goal: Diagnostic test results will improve Outcome: Progressing Goal: Respiratory complications will improve Outcome: Progressing Goal: Cardiovascular complication will be avoided Outcome: Progressing   Problem: Activity: Goal: Risk for activity intolerance will decrease Outcome: Progressing   Problem: Nutrition: Goal: Adequate nutrition will be maintained Outcome: Progressing   Problem: Coping: Goal: Level of anxiety will decrease Outcome: Progressing   Problem: Elimination: Goal: Will not experience complications related to bowel motility Outcome: Progressing   Problem: Pain Managment: Goal: General experience of comfort will improve Outcome: Progressing   Problem: Skin Integrity: Goal: Risk for impaired skin integrity will decrease Outcome: Progressing   Problem: Education: Goal: Ability to describe self-care measures that may prevent or decrease complications (Diabetes Survival Skills Education) will improve Outcome: Progressing Goal: Individualized Educational Video(s) Outcome: Progressing   Problem: Coping: Goal: Ability to adjust to condition or change in health will improve Outcome: Progressing   Problem: Fluid Volume: Goal: Ability to maintain a balanced intake and output will improve Outcome: Progressing   Problem: Health Behavior/Discharge Planning: Goal: Ability to identify and utilize available resources and services  will improve Outcome: Progressing Goal: Ability to manage health-related needs will improve Outcome: Progressing   Problem: Metabolic: Goal: Ability to maintain appropriate glucose levels will improve Outcome: Progressing   Problem: Nutritional: Goal: Maintenance of adequate nutrition will improve Outcome: Progressing Goal: Progress toward achieving an optimal weight will improve Outcome: Progressing   Problem: Skin Integrity: Goal: Risk for impaired skin integrity will decrease Outcome: Progressing   Problem: Tissue Perfusion: Goal: Adequacy of tissue perfusion will improve Outcome: Progressing

## 2023-03-19 NOTE — Progress Notes (Signed)
Attempted to change HD Cath dressing this AM, patient insisted that dialysis will change dressing. Educated patient that one corner of the dressing is peeling off and it is a risk for infection, patient again stated that dialysis will change dressing.

## 2023-03-19 NOTE — Progress Notes (Addendum)
Subjective:   Summary: Phillip Young is a 37 y.o. year old male currently admitted on the [facility] HD#57 for Acute on chronic hypoxic respiratory failure, ESRD on HD.  Subjective:  Pt seen bedside this AM. He was complaining of body aches diffusely, and states his dilaudid did not do much to help him yesterday. He is sitting up in bed, and on 6L nasal cannula. He states he is now amenable to receiving his eliquis.   Objective:  Vital signs in last 24 hours: Vitals:   03/18/23 1930 03/19/23 0007 03/19/23 0419 03/19/23 0500  BP: 110/75 115/84 125/79   Pulse: 90 (!) 106    Resp: 19 16 18    Temp: 97.9 F (36.6 C) 98.3 F (36.8 C)    TempSrc: Oral Oral    SpO2: 100% 100% 99%   Weight:    (!) 175.8 kg  Height:       Supplemental O2: HFNC SpO2: 99 % O2 Flow Rate (L/min): 6 L/min FiO2 (%): 30 %   Physical Exam:  Constitutional: chronically ill appearing, laying in bed, in no acute distress.  Cardiovascular: Irregular rate and rhythm, no murmurs, rubs or gallops Pulmonary/Chest: Anterior lung exam showed decreased breath sounds bilaterally  Abdominal: soft, non-tender, non-distended  Filed Weights   03/15/23 0420 03/18/23 0524 03/19/23 0500  Weight: (!) 182.5 kg (!) 175.9 kg (!) 175.8 kg     Intake/Output Summary (Last 24 hours) at 03/19/2023 0800 Last data filed at 03/19/2023 0552 Gross per 24 hour  Intake 540 ml  Output --  Net 540 ml   Net IO Since Admission: 397.08 mL [03/19/23 0800]  Pertinent Labs:    Latest Ref Rng & Units 03/19/2023    5:06 AM 03/18/2023    5:50 AM 03/17/2023    6:40 AM  CBC  WBC 4.0 - 10.5 K/uL 9.1  7.2  8.2   Hemoglobin 13.0 - 17.0 g/dL 9.1  8.1  8.4   Hematocrit 39.0 - 52.0 % 29.1  25.8  26.6   Platelets 150 - 400 K/uL 264  248  251        Latest Ref Rng & Units 03/19/2023    5:06 AM 03/18/2023    5:50 AM 03/17/2023    6:40 AM  CMP  Glucose 70 - 99 mg/dL 96  147  829   BUN 6 - 20 mg/dL 48  35  58   Creatinine 0.61 -  1.24 mg/dL 5.62  1.30  8.65   Sodium 135 - 145 mmol/L 127  128  129   Potassium 3.5 - 5.1 mmol/L 5.4  3.9  4.8   Chloride 98 - 111 mmol/L 88  88  90   CO2 22 - 32 mmol/L 21  25  24    Calcium 8.9 - 10.3 mg/dL 78.4  69.6  29.5     Imaging: No results found.   Assessment/Plan:   Principal Problem:   Acute hypoxic respiratory failure ([name]) Active Problems:   Morbid obesity ([name])   Chronic systolic CHF (congestive heart failure) ([name])   Cardiogenic shock ([name])   ESRD (end stage renal disease) ([name])   ESRD (end stage renal disease) on dialysis ([name])   ESRD on dialysis ([name])   Hypervolemia   Sepsis with acute organ dysfunction without septic shock ([name])   Congestive heart failure ([name])   Atrial fibrillation ([name])   Pressure injury of skin  Central line infection   Patient Summary: Phillip Young is a 37 y.o. with a pertinent PMH of chronic hypoxic respiratory failure, ESRD, HFrEF with EF 20-25%, refractory Afib, amiodarone induced lung toxicity, who presented with fevers, joint pains, SOB and admitted for sepsis, now resolved.   #Acute on chronic HFrEF with pulmonary edema #Paroxysmal Afib/flutter with RVR #Nonischemic cardiomyopathy EF less than 20% -Remains in and out of Afib with HR ranging up to 150s -Patient intolerant of metoprolol and carvedilol due to allergy, amiodarone due to lung toxicity, diltiazem due to extremely reduced EF -Cardiology signed off, however recommending digoxin every other day (non-dialysis days) and frequent digoxin levels. Appreciate recs -Continue Eliquis for stroke prophylaxis -Palliative consulted, no new recs from previous consultation. Appreciate assistance -TOC consulted for [facility], pending authorization  #ESRD on HD T/Th/S #Recurrent metabolic acidosis, improving S/P TDC repair on 8/14, has been receiving dialysis. Per notation, patient has consistently required midodrine and haldol in order to tolerate HD. HD schedule  T/Th/Sa -Nephrology following, appreciate assistance -IR replaced [facility] 8/14, appreciate assistance. -Patient has had trouble tolerating due to skin breakdown and TDC clotting. Haldol as needed for agitation -Wound care reconsulted for ongoing skin breakdown making patient intolerant of dialysis. No further recs -Midodrine 30 TID for hypotension  #Acute on chronic hypoxemic/hypercapnic respiratory failure #Acute metabolic encephalopathy in setting of hypercapnia Patient was transferred from ICU on 8/9 after extubation and pressors were discontinued. Mental status has improved, patient is currently alert and following commands. Patient is questionably adherent with home CPAP -6L HFNC with O2 sats in mid 90s -Encourage incentive spirometry and PT/OT  #Hypervolemic hyponatremia #Hyperphosphatemia #Hyperkalemia -Recurrent hyperkalemia. Treating as needed with lokelma, insulin/D50, albuterol, calcium gluconate -Monitor electrolytes with daily labs, supplement as necessary -Tube feeds d/c 8/12. Monitor blood glucose with frequent checks  #Anemia of chronic disease Monitor with daily labs, transfuse as needed  #Skin breakdown Perineal and sacral skin breakdown. Patient refuses rectal tube -Continue local wound care -Wound care reconsulted, no new recs  #Lower Extremity Numbness  Pt states he has been having burning pain in his feet for the last two weeks. On exam, there is decreased sensation in the medial aspect of the right foot, but sensation is normal on the left foot. Last A1c four months ago was 5.6, less concern for diabetic neuropathy.  - Resume home gabapentin - Dilaudid 1mg  q4 PRN  #Bacteremia, resolved Blood cultures on 8/15 positive for enterobacter cloacae. WBC count has resolved to normal limits at. Unable to tolerate line holiday given needs for dialysis. Repeat blood cultures showing no growth. Treated with cefepime starting 8/14, transitioned to levaquin 8/20, abx stopped  8/23   #Mixed Septic and Cardiogenic Shock -Resolved, transferred to floor from ICU on 8/9  Diet: Normal IVF: None,   VTE: DOAC Code: Full  Dispo: Anticipated discharge to Skilled nursing facility pending medical stability Olegario Messier, MD PGY-2 Internal Medicine Resident Pager Number [phone] Please contact the on call pager after 5 pm and on weekends at [phone].

## 2023-03-19 NOTE — Progress Notes (Signed)
   03/19/23 0152  BiPAP/CPAP/SIPAP  BiPAP/CPAP/SIPAP Pt Type Adult  BiPAP/CPAP/SIPAP V60  Reason BIPAP/CPAP not in use Non-compliant  Mask Type Full face mask  Mask Size Extra large

## 2023-03-19 NOTE — Progress Notes (Signed)
Subjective: Examined in room denies shortness of breath, noted earlier only 1 hour 18 minutes of HD," signed off AMA" total UF 1.2 L//thinks he can wait till Tuesday for dialysis  Objective Vital signs in last 24 hours: Vitals:   03/19/23 0930 03/19/23 1004 03/19/23 1032 03/19/23 1126  BP: (!) 86/56 100/72 103/61 106/73  Pulse: 98 94 (!) 121 60  Resp: (!) 21 19 (!) 23 20  Temp:   98 F (36.7 C) 98.3 F (36.8 C)  TempSrc:    Oral  SpO2: 99% 100% 100% 99%  Weight:      Height:       Weight change: -0.1 kg  Physical Exam: General: obese, young male  nad  [place] O2  5L/min  Heart: RRR, no mrg Lungs: CTA bilat ant Non labored  on 5L  [place] O2 Abdomen: Obese, abd wall edema, NABS  Soft , NT, ND Extremities: 1+ bipedal edema Dialysis Access: R internal jugular TDC       Dialysis Orders: [facility]  on TTS schedule  4.25hr, EDW 180kg  425/1.5  TDC, no heparin (HIT +), lock with Na Citrate   Problem/Plan: Acute on chronic hypotension: Able to wean off pressors on 8/3, on high dose midodrine 30mg  TID. Acute on chronic resp failure: Last extubated 7/20, has intermittently required BiPAP, now on 3-6L/min O2. A-fib with RVR: Refractory to DCCV (3 attempts) and amiodarone. Persistent A-fib/flutter - HR > 100. On Eliquis. Acute on chronic HFrEF: EF 20-25%. Per cardiology, not a candidate for nodal ablation, ICD, or pacemaker. Pneumococcal bacteremia/Enterobacter cloacae bacteremia. Admitted in septic shock, complex course. Last Blood cx + on 8/13 - plan was for line holiday but felt risky due to his resp status - instead just exchanged over wire on 8/14. ESRD: Usual TTS schedule. S/p CRRT 6/29-8/7. Gets extra HD at times - s/p HD 8/14, 8/16, 8/17. Now back to usual TTS schedule -  HD today AMA as above Volume: Remains volume up with hyponatremia: NA 127 / - trying to keep him at wt 178 kg there or lower. Reiterated fluid restrictions.  Needs to stay on full HD Tx Anemia of ESRD: Hgb 8.4 - continue Aranesp  q Sat (max). Secondary HPTH: CorrCa high, Phos 5.3- no binder will restart, [place] holding VDRA. Nutrition: Alb 3.2, continue supplements (changed Nepro to pro-source to limit volume). Decubitus ulcerations: Occ restlessness while on HD. Prognosis: Poor. Considering [facility].   Lenny Pastel, PA-C [facility] [phone] 03/19/2023,12:35 PM  LOS: 57 days   Labs: Basic Metabolic Panel: Recent Labs  Lab 03/17/23 0640 03/18/23 0550 03/19/23 0506  NA 129* 128* 127*  K 4.8 3.9 5.4*  CL 90* 88* 88*  CO2 24 25 21*  GLUCOSE 102* 113* 96  BUN 58* 35* 48*  CREATININE 6.77* 5.04* 6.37*  CALCIUM 10.6* 10.3 10.6*  PHOS 4.3 3.4 5.3*   Liver Function Tests: Recent Labs  Lab 03/14/23 0548 03/15/23 0640 03/17/23 0640 03/18/23 0550 03/19/23 0506  AST 33  --   --   --   --   ALT 14  --   --   --   --   ALKPHOS 332*  --   --   --   --   BILITOT 0.6  --   --   --   --   PROT 6.8  --   --   --   --   ALBUMIN 2.7*   < > 2.9* 2.9* 3.2*   < > =  values in this interval not displayed.   No results for input(s): "LIPASE", "AMYLASE" in the last 168 hours. No results for input(s): "AMMONIA" in the last 168 hours. CBC: Recent Labs  Lab 03/15/23 0640 03/16/23 0610 03/17/23 0640 03/18/23 0550 03/19/23 0506  WBC 8.9 7.8 8.2 7.2 9.1  HGB 8.4* 8.1* 8.4* 8.1* 9.1*  HCT 26.0* 25.9* 26.6* 25.8* 29.1*  MCV 101.6* 102.8* 103.1* 103.2* 103.2*  PLT 215 227 251 248 264   Cardiac Enzymes: No results for input(s): "CKTOTAL", "CKMB", "CKMBINDEX", "TROPONINI" in the last 168 hours. CBG: Recent Labs  Lab 03/18/23 1635 03/18/23 1928 03/19/23 0004 03/19/23 0416 03/19/23 1124  GLUCAP 154* 125* 110* 152* 111*    Studies/Results: No results found. Medications:  sodium chloride 10 mL/hr at 02/21/23 1444   albumin human      (feeding supplement) PROSource Plus  30 mL Oral BID BM   apixaban  5 mg Oral BID   Chlorhexidine Gluconate Cloth  6 each Topical Q0600    cholecalciferol  1,000 Units Oral Daily   darbepoetin (ARANESP) injection - DIALYSIS  200 mcg Subcutaneous Q Sat-1800   digoxin  0.125 mg Oral Once per day on Monday Wednesday Friday   famotidine  10 mg Oral Daily   gabapentin  100 mg Oral TID   lidocaine  1 patch Transdermal Q24H   melatonin  5 mg Oral QHS   midodrine  30 mg Oral TID WC   multivitamin  1 tablet Oral QHS   mouth rinse  15 mL Mouth Rinse 4 times per day   senna-docusate  1 tablet Oral QHS   sodium chloride flush  10-40 mL Intracatheter Q12H   sodium zirconium cyclosilicate  5 g Oral Once

## 2023-03-19 NOTE — Progress Notes (Signed)
   03/19/23 1032  Vitals  Temp 98 F (36.7 C)  Pulse Rate (!) 121  Resp (!) 23  BP 103/61  SpO2 100 %  O2 Device Nasal Cannula  Type of Weight Post-Dialysis  Oxygen Therapy  O2 Flow Rate (L/min) 6 L/min  Patient Activity (if Appropriate) In bed  Pulse Oximetry Type Continuous  Post Treatment  Dialyzer Clearance Other (Comment) (Unable to give patients blood back)  Liters Processed 27.8  Fluid Removed (mL) 1200 mL  Tolerated HD Treatment No (Comment) (Patient refusing to run prescribed treatment time)  During Treatment Monitoring  Duration of HD Treatment -hour(s) 1.18 hour(s)   Received patient in bed to unit.  Alert and oriented.  Informed consent signed and in chart.   TX duration:1 hour and 18 minutes--pt came off tx AMA---refused to sign this form--signed and confirmed by 2 nurses---Dr. Arlean Hopping called and made aware of al of this  Patient-refused to allow [place] to give him albumin for his sbp in the 70s--demanded that he be repositioned in the bed first but that he wanted to end his tx completely---Dr. Schertz made aware of this situation--no new orders at this time  Transported back to the room  Alert, without acute distress.  Hand-off given to patient's nurse.   Access used: [facility] Access issues: positional ports----Dressing changed per protocol---no issues with assessment  Total UF removed: 1200 Medication(s) given: dilaudid 2mg  po  Pt transported back to room-vs are wnl--pt remains 98% on 6l of high flow o2  [facility]

## 2023-03-19 NOTE — TOC Progression Note (Signed)
Transition of Care [facility]) - Progression Note    Patient Details  Name: Phillip Young MRN: [id number] Date of Birth: 1986-04-10  Transition of Care [facility]) CM/SW Contact  Inis Sizer, LCSW Phone Number: 03/19/2023, 11:46 AM  Clinical Narrative:    CSW spoke with Florentina Addison, charge RN at [facility] who states there are no beds available today and that insurance authorization is still pending at this time. CSW informed MD of information.   Expected Discharge Plan: Home/Self Care Barriers to Discharge: Continued Medical Work up  Expected Discharge Plan and Services   Discharge Planning Services: CM Consult   Living arrangements for the past 2 months: Single Family Home                                       Social Determinants of Health (SDOH) Interventions SDOH Screenings   Food Insecurity: No Food Insecurity (01/22/2023)  Housing: Low Risk  (01/22/2023)  Transportation Needs: No Transportation Needs (01/22/2023)  Utilities: Not At Risk (01/22/2023)  Alcohol Screen: Low Risk  (09/21/2022)  Depression (PHQ2-9): High Risk (09/21/2022)  Financial Resource Strain: High Risk (09/21/2022)  Physical Activity: Inactive (09/21/2022)  Social Connections: Unknown (09/21/2022)  Stress: No Stress Concern Present (09/21/2022)  Tobacco Use: Medium Risk (01/21/2023)    Readmission Risk Interventions    06/09/2022   12:19 PM 05/12/2021   10:35 AM 10/16/2020    2:10 PM  Readmission Risk Prevention Plan  Transportation Screening Complete Complete Complete  PCP or Specialist Appt within 5-7 Days   Complete  Home Care Screening   Complete  Medication Review (RN CM)   Referral to Pharmacy  Medication Review (RN Care Manager) Complete Complete   PCP or Specialist appointment within 3-5 days of discharge Complete Complete   HRI or Home Care Consult Complete Complete   SW Recovery Care/Counseling Consult Complete Complete   Palliative Care Screening Not Applicable Complete   Skilled Nursing Facility  Not Applicable Complete

## 2023-03-20 DIAGNOSIS — J811 Chronic pulmonary edema: Secondary | ICD-10-CM | POA: Diagnosis not present

## 2023-03-20 DIAGNOSIS — I5023 Acute on chronic systolic (congestive) heart failure: Secondary | ICD-10-CM | POA: Diagnosis not present

## 2023-03-20 DIAGNOSIS — I480 Paroxysmal atrial fibrillation: Secondary | ICD-10-CM | POA: Diagnosis not present

## 2023-03-20 DIAGNOSIS — I4892 Unspecified atrial flutter: Secondary | ICD-10-CM | POA: Diagnosis not present

## 2023-03-20 LAB — BASIC METABOLIC PANEL
Anion gap: 19 — ABNORMAL HIGH (ref 5–15)
BUN: 55 mg/dL — ABNORMAL HIGH (ref 6–20)
CO2: 20 mmol/L — ABNORMAL LOW (ref 22–32)
Calcium: 10.4 mg/dL — ABNORMAL HIGH (ref 8.9–10.3)
Chloride: 87 mmol/L — ABNORMAL LOW (ref 98–111)
Creatinine, Ser: 7.29 mg/dL — ABNORMAL HIGH (ref 0.61–1.24)
GFR, Estimated: 9 mL/min — ABNORMAL LOW (ref >60)
Glucose, Bld: 102 mg/dL — ABNORMAL HIGH (ref 70–99)
Potassium: 5.1 mmol/L (ref 3.5–5.1)
Sodium: 126 mmol/L — ABNORMAL LOW (ref 135–145)

## 2023-03-20 LAB — GLUCOSE, CAPILLARY
Glucose-Capillary: 105 mg/dL — ABNORMAL HIGH (ref 70–99)
Glucose-Capillary: 112 mg/dL — ABNORMAL HIGH (ref 70–99)
Glucose-Capillary: 112 mg/dL — ABNORMAL HIGH (ref 70–99)
Glucose-Capillary: 114 mg/dL — ABNORMAL HIGH (ref 70–99)
Glucose-Capillary: 116 mg/dL — ABNORMAL HIGH (ref 70–99)
Glucose-Capillary: 132 mg/dL — ABNORMAL HIGH (ref 70–99)

## 2023-03-20 LAB — CBC
HCT: 27.9 % — ABNORMAL LOW (ref 39.0–52.0)
Hemoglobin: 8.9 g/dL — ABNORMAL LOW (ref 13.0–17.0)
MCH: 33.2 pg (ref 26.0–34.0)
MCHC: 31.9 g/dL (ref 30.0–36.0)
MCV: 104.1 fL — ABNORMAL HIGH (ref 80.0–100.0)
Platelets: 277 10*3/uL (ref 150–400)
RBC: 2.68 MIL/uL — ABNORMAL LOW (ref 4.22–5.81)
RDW: 18 % — ABNORMAL HIGH (ref 11.5–15.5)
WBC: 8.1 10*3/uL (ref 4.0–10.5)
nRBC: 0 % (ref 0.0–0.2)

## 2023-03-20 MED ORDER — HYDROMORPHONE HCL 1 MG/ML IJ SOLN
0.5000 mg | Freq: Once | INTRAMUSCULAR | Status: AC
  Administered 2023-03-20: 0.5 mg via INTRAMUSCULAR
  Filled 2023-03-20: qty 0.5

## 2023-03-20 MED ORDER — HYDROMORPHONE HCL 1 MG/ML IJ SOLN: 0.5000 mg | Freq: Once | INTRAMUSCULAR | Status: DC

## 2023-03-20 NOTE — Progress Notes (Signed)
Patient declined BIPAP tonight. Unit still at bedside if needed or he changes his mind.

## 2023-03-20 NOTE — Progress Notes (Signed)
There was IV consult for PIV access. Talked to patient's RN regarding purpose of PIV access and only reason was pain medication. There is no po pain medications only IV pain medications. Recommended to change IV pain medication to PO pain medications. Patient's RN agreed with this plan. HS [employer]

## 2023-03-20 NOTE — Progress Notes (Signed)
Subjective:  : In room denies any shortness of breath.  Or chest pain.  Stated that he can wait till Tuesday HD, after signing off AMA yesterday  Objective Vital signs in last 24 hours: Vitals:   03/20/23 0730 03/20/23 1000 03/20/23 1014 03/20/23 1111  BP: 106/70 115/88  97/65  Pulse: (!) 120 (!) 120    Resp: 17 (!) 22 (!) 24 20  Temp: 99 F (37.2 C) 98.9 F (37.2 C)  98.7 F (37.1 C)  TempSrc: Oral   Oral  SpO2: 99% 97% 94% 90%  Weight:      Height:       Weight change:    Physical Exam: General: obese, young male  nad  [place] O2  5L/min  Heart: RRR, no mrg Lungs: CTA bilat ant Non labored  on 5L  [place] O2 Abdomen: Obese, abd wall edema, NABS  Soft , NT, ND Extremities: 1+ bipedal edema/hip edema Dialysis Access: R internal jugular TDC dressing dry clear     Dialysis Orders: [facility]  on TTS schedule  4.25hr, EDW 180kg  425/1.5  TDC, no heparin (HIT +), lock with Na Citrate   Problem/Plan: Acute on chronic hypotension: Able to wean off pressors on 8/3, on high dose midodrine 30mg  TID. Acute on chronic resp failure: Last extubated 7/20, has intermittently required BiPAP, now on 3-6L/min O2. A-fib with RVR: Refractory to DCCV (3 attempts) and amiodarone. Persistent A-fib/flutter - HR > 100. On Eliquis. Acute on chronic HFrEF: EF 20-25%. Per cardiology, not a candidate for nodal ablation, ICD, or pacemaker. Pneumococcal bacteremia/Enterobacter cloacae bacteremia. Admitted in septic shock, complex course. Last Blood cx + on 8/13 - plan was for line holiday but felt risky due to his resp status - instead just exchanged over wire on 8/14. ESRD: Usual TTS schedule. S/p CRRT 6/29-8/7. Gets extra HD at times - s/p HD 8/14, 8/16, 8/17. Now back to usual TTS schedule -  HD sat 8/24 AMA after 1 hour 18 minutes claimed "would not be reposition secondary to pain" K5.1 large bag of butter finger candy at bedside advised patient against this and follow renal diet Volume: Remains volume up with  hyponatremia: NA 126 / - trying to keep him at wt 178 kg there or lower. Reiterated fluid restrictions.  Needs to stay on full HD Tx Anemia of ESRD: Hgb 8.9 - continue Aranesp q Sat (max). Secondary HPTH: CorrCa high, holding VDRA.Phos 5.3-have restarted restarted, Renvela  Nutrition: Alb 3.2, continue supplements (changed Nepro to pro-source to limit volume). Decubitus ulcerations: Occ restlessness while on HD. Prognosis: Poor. Considering [facility].   Lenny Pastel, PA-C [facility] Beeper [phone] 03/20/2023,12:42 PM  LOS: 58 days   Labs: Basic Metabolic Panel: Recent Labs  Lab 03/17/23 0640 03/18/23 0550 03/19/23 0506 03/20/23 0643  NA 129* 128* 127* 126*  K 4.8 3.9 5.4* 5.1  CL 90* 88* 88* 87*  CO2 24 25 21* 20*  GLUCOSE 102* 113* 96 102*  BUN 58* 35* 48* 55*  CREATININE 6.77* 5.04* 6.37* 7.29*  CALCIUM 10.6* 10.3 10.6* 10.4*  PHOS 4.3 3.4 5.3*  --    Liver Function Tests: Recent Labs  Lab 03/14/23 0548 03/15/23 0640 03/17/23 0640 03/18/23 0550 03/19/23 0506  AST 33  --   --   --   --   ALT 14  --   --   --   --   ALKPHOS 332*  --   --   --   --  BILITOT 0.6  --   --   --   --   PROT 6.8  --   --   --   --   ALBUMIN 2.7*   < > 2.9* 2.9* 3.2*   < > = values in this interval not displayed.   No results for input(s): "LIPASE", "AMYLASE" in the last 168 hours. No results for input(s): "AMMONIA" in the last 168 hours. CBC: Recent Labs  Lab 03/16/23 0610 03/17/23 0640 03/18/23 0550 03/19/23 0506 03/20/23 0643  WBC 7.8 8.2 7.2 9.1 8.1  HGB 8.1* 8.4* 8.1* 9.1* 8.9*  HCT 25.9* 26.6* 25.8* 29.1* 27.9*  MCV 102.8* 103.1* 103.2* 103.2* 104.1*  PLT 227 251 248 264 277   Cardiac Enzymes: No results for input(s): "CKTOTAL", "CKMB", "CKMBINDEX", "TROPONINI" in the last 168 hours. CBG: Recent Labs  Lab 03/19/23 2054 03/20/23 0003 03/20/23 0358 03/20/23 0824 03/20/23 1109  GLUCAP 125* 112* 114* 132* 112*     Studies/Results: No results found. Medications:  sodium chloride 10 mL/hr at 02/21/23 1444   albumin human      (feeding supplement) PROSource Plus  30 mL Oral BID BM   apixaban  5 mg Oral BID   Chlorhexidine Gluconate Cloth  6 each Topical Q0600   cholecalciferol  1,000 Units Oral Daily   darbepoetin (ARANESP) injection - DIALYSIS  200 mcg Subcutaneous Q Sat-1800   digoxin  0.125 mg Oral Once per day on Monday Wednesday Friday   famotidine  10 mg Oral Daily   gabapentin  100 mg Oral TID   lidocaine  1 patch Transdermal Q24H   melatonin  5 mg Oral QHS   midodrine  30 mg Oral TID WC   multivitamin  1 tablet Oral QHS   mouth rinse  15 mL Mouth Rinse 4 times per day   senna-docusate  1 tablet Oral QHS   sevelamer carbonate  1,600 mg Oral TID WC

## 2023-03-20 NOTE — Plan of Care (Signed)
  Problem: Education: Goal: Knowledge of General Education information will improve Description: Including pain rating scale, medication(s)/side effects and non-pharmacologic comfort measures Outcome: Progressing   Problem: Health Behavior/Discharge Planning: Goal: Ability to manage health-related needs will improve Outcome: Progressing   Problem: Clinical Measurements: Goal: Will remain free from infection Outcome: Progressing   Problem: Activity: Goal: Risk for activity intolerance will decrease Outcome: Progressing   Problem: Nutrition: Goal: Adequate nutrition will be maintained Outcome: Progressing   Problem: Pain Managment: Goal: General experience of comfort will improve Outcome: Progressing

## 2023-03-20 NOTE — Progress Notes (Signed)
Subjective:   Summary: Phillip Young is a 37 y.o. year old male currently admitted on the IMTS HD#58 for Acute on chronic hypoxic respiratory failure, ESRD on HD.  Subjective:  Pt seen bedside this AM. He is still complaining of aching everywhere, and states his dilaudid does not to anything anymore. He also states that he was attempting to get repositioned during dialysis however his requests were not met.   Objective:  Vital signs in last 24 hours: Vitals:   03/19/23 2025 03/19/23 2359 03/20/23 0400 03/20/23 0730  BP:  (!) 96/55 126/67 106/70  Pulse: 95 100    Resp:  17 16 17   Temp:  97.7 F (36.5 C) 98.6 F (37 C) 99 F (37.2 C)  TempSrc:  Oral  Oral  SpO2: 98% 95% 95% 99%  Weight:      Height:       Supplemental O2: HFNC SpO2: 99 % O2 Flow Rate (L/min): 4 L/min FiO2 (%): 30 %   Physical Exam:  Constitutional: chronically ill appearing, laying in bed, in no acute distress.  Cardiovascular: Irregular rate and rhythm, no murmurs, rubs or gallops Pulmonary/Chest: Anterior lung exam showed decreased breath sounds bilaterally  Abdominal: soft, non-tender, non-distended  Filed Weights   03/18/23 0524 03/19/23 0500  Weight: (!) 175.9 kg (!) 175.8 kg     Intake/Output Summary (Last 24 hours) at 03/20/2023 0903 Last data filed at 03/20/2023 0851 Gross per 24 hour  Intake 1200 ml  Output 1200 ml  Net 0 ml   Net IO Since Admission: 757.08 mL [03/20/23 0903]  Pertinent Labs:    Latest Ref Rng & Units 03/20/2023    6:43 AM 03/19/2023    5:06 AM 03/18/2023    5:50 AM  CBC  WBC 4.0 - 10.5 K/uL 8.1  9.1  7.2   Hemoglobin 13.0 - 17.0 g/dL 8.9  9.1  8.1   Hematocrit 39.0 - 52.0 % 27.9  29.1  25.8   Platelets 150 - 400 K/uL 277  264  248        Latest Ref Rng & Units 03/20/2023    6:43 AM 03/19/2023    5:06 AM 03/18/2023    5:50 AM  CMP  Glucose 70 - 99 mg/dL 528  96  413   BUN 6 - 20 mg/dL 55  48  35   Creatinine 0.61 - 1.24 mg/dL 2.44  0.10   2.72   Sodium 135 - 145 mmol/L 126  127  128   Potassium 3.5 - 5.1 mmol/L 5.1  5.4  3.9   Chloride 98 - 111 mmol/L 87  88  88   CO2 22 - 32 mmol/L 20  21  25    Calcium 8.9 - 10.3 mg/dL 53.6  64.4  03.4     Imaging: No results found.   Assessment/Plan:   Principal Problem:   Acute hypoxic respiratory failure ([name]) Active Problems:   Morbid obesity ([name])   Chronic systolic CHF (congestive heart failure) ([name])   Cardiogenic shock ([name])   ESRD (end stage renal disease) ([name])   ESRD (end stage renal disease) on dialysis ([name])   ESRD on dialysis ([name])   Hypervolemia   Sepsis with acute organ dysfunction without septic shock ([name])   Congestive heart failure ([name])   Atrial fibrillation ([name])   Pressure injury of skin   Central line infection   Patient Summary: Phillip Noa  Young is a 37 y.o. with a pertinent PMH of chronic hypoxic respiratory failure, ESRD, HFrEF with EF 20-25%, refractory Afib, amiodarone induced lung toxicity, who presented with fevers, joint pains, SOB and admitted for sepsis, now resolved.   #Acute on chronic HFrEF with pulmonary edema #Paroxysmal Afib/flutter with RVR #Nonischemic cardiomyopathy EF less than 20% -Remains in and out of Afib with HR ranging up to 150s -Patient intolerant of metoprolol and carvedilol due to allergy, amiodarone due to lung toxicity, diltiazem due to extremely reduced EF -Cardiology signed off, however recommending digoxin every other day (non-dialysis days) and frequent digoxin levels. Appreciate recs -Continue Eliquis for stroke prophylaxis -Palliative consulted, no new recs from previous consultation. Appreciate assistance -TOC consulted for [facility] placement, pending authorization  #ESRD on HD T/Th/S #Recurrent metabolic acidosis, improving S/P TDC repair on 8/14, has been receiving dialysis. Per notation, patient has consistently required midodrine and haldol in order to tolerate HD. HD schedule T/Th/Sa -Nephrology following,  appreciate assistance -IR replaced [facility] 8/14, appreciate assistance. -Patient has had trouble tolerating due to skin breakdown and TDC clotting. Haldol as needed for agitation -Wound care reconsulted for ongoing skin breakdown making patient intolerant of dialysis. No further recs -Midodrine 30 TID for hypotension  #Acute on chronic hypoxemic/hypercapnic respiratory failure #Acute metabolic encephalopathy in setting of hypercapnia Patient was transferred from ICU on 8/9 after extubation and pressors were discontinued. Mental status has improved, patient is currently alert and following commands. Patient is questionably adherent with home CPAP -6L HFNC with O2 sats in mid 90s -Encourage incentive spirometry and PT/OT  #Hypervolemic hyponatremia #Hyperphosphatemia #Hyperkalemia -Recurrent hyperkalemia. Treating as needed with lokelma, insulin/D50, albuterol, calcium gluconate -Monitor electrolytes with daily labs, supplement as necessary -Tube feeds d/c 8/12. Monitor blood glucose with frequent checks  #Anemia of chronic disease Monitor with daily labs, transfuse as needed  #Skin breakdown Perineal and sacral skin breakdown. Patient refuses rectal tube -Continue local wound care -Wound care reconsulted, no new recs  #Lower Extremity Numbness  #Chronic Pain 2/2 Deconditioning Due to pt's chronic illnesses, he has been bed bound and complains of diffuse pain, mostly in his feet. Unfortunately, gabapentin therapy is limited due to ESRD, and he is on max safe dose for this. His current regimen includes dilaudid 1mg  PO q4 PRN, which he states is unaffective. I will try a one time IM dose of dilaudid for him, however again due to ESRD pain regimen is also limited.  - Resume home gabapentin - Dilaudid 1mg  q4 PRN - One time dose of IM Dilaudid  #Bacteremia, resolved Blood cultures on 8/15 positive for enterobacter cloacae. WBC count has resolved to normal limits at. Unable to tolerate line  holiday given needs for dialysis. Repeat blood cultures showing no growth. Treated with cefepime starting 8/14, transitioned to levaquin 8/20, abx stopped 8/23   #Mixed Septic and Cardiogenic Shock -Resolved, transferred to floor from ICU on 8/9  Diet: Normal IVF: None,   VTE: DOAC Code: Full  Dispo: Anticipated discharge to Skilled nursing facility pending medical stability Olegario Messier, MD PGY-2 Internal Medicine Resident Pager Number [phone] Please contact the on call pager after 5 pm and on weekends at [phone].

## 2023-03-21 ENCOUNTER — Other Ambulatory Visit: Payer: Self-pay

## 2023-03-21 DIAGNOSIS — I4892 Unspecified atrial flutter: Secondary | ICD-10-CM | POA: Diagnosis not present

## 2023-03-21 DIAGNOSIS — I480 Paroxysmal atrial fibrillation: Secondary | ICD-10-CM | POA: Diagnosis not present

## 2023-03-21 DIAGNOSIS — J811 Chronic pulmonary edema: Secondary | ICD-10-CM | POA: Diagnosis not present

## 2023-03-21 DIAGNOSIS — I5023 Acute on chronic systolic (congestive) heart failure: Secondary | ICD-10-CM | POA: Diagnosis not present

## 2023-03-21 DIAGNOSIS — E875 Hyperkalemia: Secondary | ICD-10-CM

## 2023-03-21 LAB — GLUCOSE, CAPILLARY
Glucose-Capillary: 112 mg/dL — ABNORMAL HIGH (ref 70–99)
Glucose-Capillary: 99 mg/dL (ref 70–99)
Glucose-Capillary: 93 mg/dL (ref 70–99)
Glucose-Capillary: 86 mg/dL (ref 70–99)

## 2023-03-21 LAB — RENAL FUNCTION PANEL
Albumin: 3.2 g/dL — ABNORMAL LOW (ref 3.5–5.0)
Albumin: 3.1 g/dL — ABNORMAL LOW (ref 3.5–5.0)
Anion gap: 20 — ABNORMAL HIGH (ref 5–15)
Anion gap: 18 — ABNORMAL HIGH (ref 5–15)
BUN: 73 mg/dL — ABNORMAL HIGH (ref 6–20)
BUN: 76 mg/dL — ABNORMAL HIGH (ref 6–20)
CO2: 18 mmol/L — ABNORMAL LOW (ref 22–32)
CO2: 21 mmol/L — ABNORMAL LOW (ref 22–32)
Calcium: 10.5 mg/dL — ABNORMAL HIGH (ref 8.9–10.3)
Calcium: 10.6 mg/dL — ABNORMAL HIGH (ref 8.9–10.3)
Chloride: 87 mmol/L — ABNORMAL LOW (ref 98–111)
Chloride: 86 mmol/L — ABNORMAL LOW (ref 98–111)
Creatinine, Ser: 8.82 mg/dL — ABNORMAL HIGH (ref 0.61–1.24)
Creatinine, Ser: 9.19 mg/dL — ABNORMAL HIGH (ref 0.61–1.24)
GFR, Estimated: 7 mL/min — ABNORMAL LOW (ref >60)
GFR, Estimated: 7 mL/min — ABNORMAL LOW (ref >60)
Glucose, Bld: 95 mg/dL (ref 70–99)
Glucose, Bld: 96 mg/dL (ref 70–99)
Phosphorus: 5.6 mg/dL — ABNORMAL HIGH (ref 2.5–4.6)
Phosphorus: 6.2 mg/dL — ABNORMAL HIGH (ref 2.5–4.6)
Potassium: 6.4 mmol/L (ref 3.5–5.1)
Potassium: 6.7 mmol/L (ref 3.5–5.1)
Sodium: 125 mmol/L — ABNORMAL LOW (ref 135–145)
Sodium: 125 mmol/L — ABNORMAL LOW (ref 135–145)

## 2023-03-21 LAB — CBC
HCT: 26.3 % — ABNORMAL LOW (ref 39.0–52.0)
Hemoglobin: 8.4 g/dL — ABNORMAL LOW (ref 13.0–17.0)
MCH: 32.3 pg (ref 26.0–34.0)
MCHC: 31.9 g/dL (ref 30.0–36.0)
MCV: 101.2 fL — ABNORMAL HIGH (ref 80.0–100.0)
Platelets: 307 10*3/uL (ref 150–400)
RBC: 2.6 MIL/uL — ABNORMAL LOW (ref 4.22–5.81)
RDW: 17.8 % — ABNORMAL HIGH (ref 11.5–15.5)
WBC: 8.9 10*3/uL (ref 4.0–10.5)
nRBC: 0 % (ref 0.0–0.2)

## 2023-03-21 MED ORDER — CALCIUM GLUCONATE-NACL 1-0.675 GM/50ML-% IV SOLN
1.0000 g | Freq: Once | INTRAVENOUS | Status: DC
  Filled 2023-03-21: qty 50

## 2023-03-21 MED ORDER — CHLORHEXIDINE GLUCONATE CLOTH 2 % EX PADS
6.0000 | MEDICATED_PAD | Freq: Every day | CUTANEOUS | Status: DC
  Administered 2023-03-22: 6 via TOPICAL

## 2023-03-21 MED ORDER — DEXTROSE 50 % IV SOLN: 1.0000 | Freq: Once | INTRAVENOUS | Status: DC

## 2023-03-21 MED ORDER — SODIUM ZIRCONIUM CYCLOSILICATE 10 G PO PACK
10.0000 g | PACK | Freq: Once | ORAL | Status: AC
  Administered 2023-03-21: 10 g via ORAL
  Filled 2023-03-21: qty 1

## 2023-03-21 MED ORDER — SODIUM BICARBONATE 8.4 % IV SOLN: 50.0000 meq | Freq: Once | INTRAVENOUS | Status: DC

## 2023-03-21 MED ORDER — PNEUMOCOCCAL 20-VAL CONJ VACC 0.5 ML IM SUSY
0.5000 mL | PREFILLED_SYRINGE | INTRAMUSCULAR | Status: DC
  Filled 2023-03-21: qty 0.5

## 2023-03-21 MED ORDER — INSULIN ASPART 100 UNIT/ML IV SOLN: 5.0000 [IU] | Freq: Once | INTRAVENOUS | Status: DC

## 2023-03-21 MED ORDER — OXYCODONE HCL 5 MG PO TABS
10.0000 mg | ORAL_TABLET | ORAL | Status: DC | PRN
  Administered 2023-03-21 – 2023-03-23 (×5): 10 mg via ORAL
  Filled 2023-03-21 (×5): qty 2

## 2023-03-21 MED ORDER — CALCIUM GLUCONATE-NACL 1-0.675 GM/50ML-% IV SOLN
1.0000 g | INTRAVENOUS | Status: DC
  Filled 2023-03-21: qty 50

## 2023-03-21 NOTE — Progress Notes (Signed)
Occupational Therapy Treatment Patient Details Name: Phillip Young MRN: [id number] DOB: [birth year] Today's Date: 03/21/2023   History of present illness Pt is a 37 y/o M presenting to ED on 6/28 with fever, joint pain, and SOB after missing HD. Admitted for acute hypoxic respiratory failure. Intubated and started on CRRT, vasopressor support on 6/30. Extubated 7/20. Underwent DCCV 7/5, 7/6, and 7/7 without success. Pt off CRRT on 8/7.  PMH includes ESRD, NICM, refractory A fib, HFrEF   OT comments  Patient with continued progress toward patient focused goals.  Patient exhibiting better bed mobility and ability to participate with upper body ADL in sitting.  Deficits listed below remain.  OT can continue efforts in the acute setting to address deficits.  LTACH is recommended for post acute rehab.        If plan is discharge home, recommend the following:  Two people to help with walking and/or transfers;Two people to help with bathing/dressing/bathroom;Assistance with cooking/housework;Assistance with feeding;Direct supervision/assist for medications management;Direct supervision/assist for financial management;Help with stairs or ramp for entrance;Assist for transportation   Equipment Recommendations  Hospital bed;Wheelchair cushion (measurements OT);Wheelchair (measurements OT)    Recommendations for Other Services      Precautions / Restrictions Precautions Precautions: Fall Precaution Comments: wounds on hip/back/buttocks Restrictions Weight Bearing Restrictions: No       Mobility Bed Mobility       Sidelying to sit: Mod assist, +2 for physical assistance     Sit to sidelying: Max assist   Patient Response: Cooperative  Transfers                         Balance Overall balance assessment: Needs assistance Sitting-balance support: Bilateral upper extremity supported, Feet unsupported Sitting balance-Leahy Scale: Fair                                      ADL either performed or assessed with clinical judgement   ADL   Eating/Feeding: Independent;Bed level   Grooming: Contact guard assist;Sitting   Upper Body Bathing: Moderate assistance;Sitting   Lower Body Bathing: Bed level;Total assistance   Upper Body Dressing : Moderate assistance;Sitting   Lower Body Dressing: Total assistance;Bed level                      Extremity/Trunk Assessment Upper Extremity Assessment RUE Deficits / Details: unable to flex shoulder without assists, elbow/wrist/hand ROM 3+/5 RUE Coordination: decreased gross motor   Lower Extremity Assessment Lower Extremity Assessment: Defer to PT evaluation   Cervical / Trunk Assessment Cervical / Trunk Assessment: Normal;Other exceptions Cervical / Trunk Exceptions: Body Habitus    Vision Patient Visual Report: No change from baseline     Perception Perception Perception: Not tested   Praxis Praxis Praxis: Not tested    Cognition Arousal: Alert Behavior During Therapy: WFL for tasks assessed/performed Overall Cognitive Status: Within Functional Limits for tasks assessed                                                             Pertinent Vitals/ Pain       Pain Assessment Pain Assessment: Faces Faces Pain Scale: Hurts even more Pain Location:  buttocks, B feet/ankles Pain Descriptors / Indicators: Discomfort, Aching, Grimacing, Burning Pain Intervention(s): Monitored during session                                                          Frequency  Min 1X/week        Progress Toward Goals  OT Goals(current goals can now be found in the care plan section)  Progress towards OT goals: Progressing toward goals  Acute Rehab OT Goals OT Goal Formulation: With patient Time For Goal Achievement: 04/04/23 Potential to Achieve Goals: Fair  Plan      Co-evaluation    PT/OT/SLP Co-Evaluation/Treatment: Yes Reason for  Co-Treatment: For patient/therapist safety;Complexity of the patient's impairments (multi-system involvement);To address functional/ADL transfers   OT goals addressed during session: ADL's and self-care;Strengthening/ROM      AM-PAC OT "6 Clicks" Daily Activity     Outcome Measure   Help from another person eating meals?: None Help from another person taking care of personal grooming?: A Little Help from another person toileting, which includes using toliet, bedpan, or urinal?: A Lot Help from another person bathing (including washing, rinsing, drying)?: A Lot Help from another person to put on and taking off regular upper body clothing?: A Lot Help from another person to put on and taking off regular lower body clothing?: Total 6 Click Score: 14    End of Session Equipment Utilized During Treatment: Oxygen  OT Visit Diagnosis: Unsteadiness on feet (R26.81);Other abnormalities of gait and mobility (R26.89);Muscle weakness (generalized) (M62.81);Other symptoms and signs involving cognitive function   Activity Tolerance Patient tolerated treatment well   Patient Left in bed;with call bell/phone within reach;with family/visitor present   Nurse Communication Mobility status        Time: 0865-7846 OT Time Calculation (min): 24 min  Charges: OT General Charges $OT Visit: 1 Visit OT Treatments $Self Care/Home Management : 8-22 mins  03/21/2023  RP, OTR/L  Acute Rehabilitation Services  Office:  [phone]   Suzanna Obey 03/21/2023, 2:51 PM

## 2023-03-21 NOTE — Progress Notes (Signed)
Nutrition Follow-up  DOCUMENTATION CODES:   Morbid obesity  INTERVENTION:   Encouraged patient to stop snack on butterfingers immediately  Offered diet education-patient declined   Continue Prosource Plus TID   Continue renal diet and FR per physician discretion -encourage compliance    NUTRITION DIAGNOSIS:   Inadequate oral intake related to inability to eat as evidenced by NPO status. -ongoing   GOAL:   Patient will meet greater than or equal to 90% of their needs -being met with po intake   MONITOR:   PO intake, Diet advancement, TF tolerance, Weight trends, Labs, Skin  REASON FOR ASSESSMENT:   Ventilator, Consult Assessment of nutrition requirement/status  ASSESSMENT:   37 year old male with PMHx of ESRD on HD, nonischemic cardiomyopathy, refractory A-fib, possible amio lung toxicity admitted with acute on chronic hypoxmic respiratory failure in setting of virus and missed HD, probable sepsis.  Visited patient at bedside who reports good appetite and that he has been compliant with his fluid restriction. He reports snacking between meals on butterfingers. He reports eating 2-3 of the fun pack sized bars each day. RD explained to him that he needs to find a different snack food due to chocolate not being compliant with his diet and it being high in phosphorus   Patient refused diet edu   No reported N/V/D/C   Patient to start back on routine dialysis schedule (TThS)   Labs: Na 125, K+ 6.4, BUN 73, Cr 8.82, phos 5.6 Meds: reviewed   Wt: dry wt 396#   PO: 100% at all documented meals   I/O's: -2.6 L since admission    Diet Order:   Diet Order             Diet renal with fluid restriction Fluid restriction: 1200 mL Fluid; Room service appropriate? Yes; Fluid consistency: Thin  Diet effective now                   EDUCATION NEEDS:   No education needs have been identified at this time  Skin:  Skin Assessment: Skin Integrity Issues: Skin  Integrity Issues:: Stage II, Other (Comment) Stage II: coccyx Other: MASD to buttocks with fissure in gluteal fold; non-blanchable dark area on L hip, L flank, back (not presenting like DTIPI, thought possibly related to poor skin/tissue perfusion and pressor use  Last BM:  8/25 type 7  Height:   Ht Readings from Last 1 Encounters:  01/21/23 6' (1.829 m)    Weight:   Wt Readings from Last 1 Encounters:  03/21/23 (!) 182.1 kg    Ideal Body Weight:  80.9 kg  BMI:  Body mass index is 54.45 kg/m.  Estimated Nutritional Needs:   Kcal:  2100-2300 kcals  Protein:  160-190 g  Fluid:  UOP + 1 L    Leodis Rains, RDN, LDN  Clinical Nutrition

## 2023-03-21 NOTE — Consult Note (Signed)
WOC Nurse Consult Note: Reason for Consult: Consulted once again for left posterior thigh wound.  Depth has evolved from 0.2 cm to 2 cm.  Etiology of wounds is moisture and pressure.  Patient must lie during dialysis and staff are not able to make significant changes in position while patient is undergoing dialysis for safety.  At my visit on 8/14, patient felt wound care was being performed well.  Patient is concerned about increased drainage and depth to the wounds in this area.  Attention needs to be made to ensure wound depth is adequately filled (now 2 cm) each time wound care is performed.  Wound type: Pressure Injury POA: NA Measurement: scattered nonintact areas totaling 13 cm x 20 cm x 0.3 cm with 3 open areas that have increased depth of 2 cm .  Wound bed: red, moist Drainage (amount, consistency, odor) moderate yellow effluent  no odor Periwound: frequently moist  incontinent of stool.  Refuses rectal pouching.  If incontinent during dialysis, cannot be promptly repositioned for care due to safety.  Likely exposure to feces.  Dressing procedure/placement/frequency: Continue aquacel, sufficiently fill wound depth to promote healing and absorb drainage.  Cover with silicone foam dressing.  Can change foam and aquacel daily to  better manage effluent.  Will not follow at this time.  Please re-consult if new issues arise.  Mike Gip MSN, RN, FNP-BC CWON Wound, Ostomy, Continence Nurse Outpatient [facility] [phone] Pager [phone]

## 2023-03-21 NOTE — Progress Notes (Signed)
HD#59 SUBJECTIVE:  Patient Summary: Phillip Young is a 37 y.o. with a pertinent PMH of chronic hypoxic respiratory failure, amiodarone-induced lung toxicity, ESRD on TTS HD, HFrEF with EF 20-25%, refractory Afib, who presented with fevers, joint pains, SOB and admitted for Acute on chronic hypoxic respiratory failure, ESRD on HD, and septis (now resolved).   Overnight Events: Complained of pain, pt did not want to take his 2 mg PO dilaudid because he does not think pain adequately controlled by PO meds. Did receive one dose 0.5mg  dilaudid IM.  Interim History: Pt reports continued pain. Pain is primarily in the bilateral feet (especially toes) which is tingly/hot/cold and the L posterior thigh where he has an ulcer. States his po dilaudid is not helpful. He was on oxycodone 15 in the past which helped him more. He denies dyspnea. No other complaints.  OBJECTIVE:  Vital Signs: Vitals:   03/20/23 1954 03/20/23 2327 03/20/23 2350 03/21/23 0341  BP:   104/88 116/78  Pulse:   (!) 52 (!) 101  Resp: (!) 21 17 20 17   Temp:   98.7 F (37.1 C) 98.7 F (37.1 C)  TempSrc:   Oral Oral  SpO2:   99% 97%  Weight:    (!) 182.1 kg  Height:       Supplemental O2: Nasal Cannula SpO2: 97 % O2 Flow Rate (L/min): 3 L/min FiO2 (%): 30 %  Filed Weights   03/18/23 0524 03/19/23 0500 03/21/23 0341  Weight: (!) 175.9 kg (!) 175.8 kg (!) 182.1 kg     Intake/Output Summary (Last 24 hours) at 03/21/2023 0701 Last data filed at 03/20/2023 1520 Gross per 24 hour  Intake 952 ml  Output --  Net 952 ml   Net IO Since Admission: 1,469.08 mL [03/21/23 0701]  Physical Exam: Physical Exam Constitutional:      General: He is not in acute distress.    Appearance: Normal appearance. He is obese. He is not ill-appearing.  Cardiovascular:     Rate and Rhythm: Normal rate and regular rhythm.     Pulses: Normal pulses.  Pulmonary:     Effort: Pulmonary effort is normal.     Breath sounds: Normal breath  sounds.  Abdominal:     General: Abdomen is flat. Bowel sounds are normal.     Palpations: Abdomen is soft.     Tenderness: There is no abdominal tenderness.  Neurological:     Mental Status: He is alert.  Psychiatric:        Mood and Affect: Mood normal.        Behavior: Behavior normal.     Patient Lines/Drains/Airways Status     Active Line/Drains/Airways     Name Placement date Placement time Site Days   Hemodialysis Catheter Right Internal jugular Double lumen Permanent (Tunneled) 03/09/23  0933  Internal jugular  12   Pressure Injury 02/03/23 Coccyx Mid Stage 2 -  Partial thickness loss of dermis presenting as a shallow open injury with a red, pink wound bed without slough. 02/03/23  0800  -- 46   Pressure Injury 02/08/23 Hip Left;Upper;Lateral;Posterior Deep Tissue Pressure Injury - Purple or maroon localized area of discolored intact skin or blood-filled blister due to damage of underlying soft tissue from pressure and/or shear. Full thickness w 02/08/23  1200  -- 41   Wound / Incision (Open or Dehisced) 02/04/23 Skin tear Hip Left;Posterior small circular skin tears on left hip and back 02/04/23  1345  Hip  45  Wound / Incision (Open or Dehisced) 02/08/23 (IAD) Incontinence Associated Dermatitis Buttocks Right;Left;Posterior Incontinence associated skin damage 02/08/23  0800  Buttocks  41             ASSESSMENT/PLAN:  Assessment: Principal Problem:   Acute hypoxic respiratory failure ([name]) Active Problems:   Morbid obesity ([name])   Chronic systolic CHF (congestive heart failure) ([name])   Cardiogenic shock ([name])   ESRD (end stage renal disease) ([name])   ESRD (end stage renal disease) on dialysis ([name])   ESRD on dialysis ([name])   Hypervolemia   Sepsis with acute organ dysfunction without septic shock ([name])   Congestive heart failure ([name])   Atrial fibrillation ([name])   Pressure injury of skin   Central line infection   Patient Summary: Phillip Young is a 37 y.o.  with a pertinent PMH of chronic hypoxic respiratory failure, ESRD, HFrEF with EF 20-25%, refractory Afib, amiodarone induced lung toxicity, who presented with fevers, joint pains, SOB and admitted for acute on chronic hypoxic respiratory failure, ESRD on HD, and septis (now resolved).    Active Issues with changes today  #Hypervolemic hyponatremia #Hyperphosphatemia #Hyperkalemia Recurrent hyperkalemia.  - today elevated K of 6.4  - Lokelma provided, ECG pending, will treat with insulin/D50/calcium if indicated  - Dialysis tomorrow - In general, will monitor electrolytes with daily labs, supplement as necessary -Tube feeds d/c 8/12. Monitor blood glucose with frequent checks  #L posterior thigh decubitus ulcer  Wound care reconsulted today 8/26 per patient concern of wound drainage and depth. Ongoing skin breakdown has made patient intolerant of dialysis. He was previously evaluated on 8/23 without further recs. Appreciate the help. - Continued pain, replace dilaudid with oxycodone 10mg  (see below)  #Lower Extremity Numbness  #Chronic Pain 2/2 Deconditioning Due to pt's chronic illnesses, he has been bed bound and complains of diffuse pain, mostly in his feet. Unfortunately, gabapentin therapy is limited due to ESRD, and he is on max safe dose for this. His current regimen includes dilaudid 1mg  PO q4 PRN, which he states is unaffective. I will try a one time IM dose of dilaudid for him, however again due to ESRD pain regimen is also limited.  - Resume home gabapentin - Discontinue dilaudid - Start oxycodone 10mg  po q4. Pt reports he usually needs 15mg  for management, will evaluate in time.  Active Issues, currently stable  #Acute on chronic HFrEF with pulmonary edema #Paroxysmal Afib/flutter with RVR #Nonischemic cardiomyopathy EF less than 20% -Remains in and out of Afib with HR ranging up to 150s -Patient intolerant of metoprolol and carvedilol due to allergy, amiodarone due to lung  toxicity, diltiazem due to extremely reduced EF -Cardiology signed off, however recommending digoxin every other day (non-dialysis days) and frequent digoxin levels. Appreciate recs -Continue Eliquis for stroke prophylaxis -Palliative consulted, no new recs from previous consultation. Appreciate assistance -TOC consulted for [facility] placement, pending authorization   #ESRD on HD T/Th/S with hypotension #Recurrent metabolic acidosis, improving S/P TDC repair on 8/14, has been receiving dialysis. Per notation, patient has consistently required midodrine and haldol in order to tolerate HD. HD schedule T/Th/Sa. -Nephrology following, appreciate assistance -IR replaced [facility] 8/14, appreciate assistance. -Patient has had trouble tolerating due to skin breakdown and TDC clotting. Haldol as needed for agitation -Midodrine 30 TID for hypotension    #Acute on chronic hypoxemic/hypercapnic respiratory failure #Acute metabolic encephalopathy in setting of hypercapnia (resolved) Patient was transferred from ICU on 8/9 after extubation and pressors were discontinued. Mental status has improved,  patient is currently alert and following commands. Patient is questionably adherent with home CPAP -3L HFNC with O2 sats in mid 90s -Encourage incentive spirometry and PT/OT  #Anemia of chronic disease Monitor with daily labs, transfuse as needed   #Skin breakdown Perineal and sacral skin breakdown. Patient refuses rectal tube -Continue local wound care -Wound care reconsulted, no new recs   #Bacteremia, resolved Blood cultures on 8/15 positive for enterobacter cloacae. WBC count has resolved to normal limits at. Unable to tolerate line holiday given needs for dialysis. Repeat blood cultures showing no growth. Treated with cefepime starting 8/14, transitioned to levaquin 8/20, abx stopped 8/23    #Mixed Septic and Cardiogenic Shock -Resolved, transferred to floor from ICU on 8/9  Best Practice: Diet: Renal  diet - 1217mL/day fluid restriction IVF: Fluids: oral, Rate: None VTE: DOAC Code: Full DISPO: Anticipated discharge to  [facility]  pending  placement and insurance approval .  Signature: [name], D.O.  Internal Medicine Resident, PGY-1 [facility]  Pager: [phone] 7:01 AM, 03/21/2023   Please communicate with direct chat if possible, please page for urgent issues. Please contact the on call pager after 5 pm and on weekends at [phone].

## 2023-03-21 NOTE — Progress Notes (Addendum)
[facility] Progress Note   Subjective:   Seen in room - no CP/dyspnea. Last HD shortened due to pain - primary team just finished speaking with him regarding this.  ADDENDUM (12:15p): Secure texted by primary team that K is 6.4 - they gave Lokelma 10g just now. Plan is to repeat labs later and we can dialyzed tonight if K remains high. KS  Objective Vitals:   03/20/23 2327 03/20/23 2350 03/21/23 0341 03/21/23 0740  BP:  104/88 116/78 91/67  Pulse:  (!) 52 (!) 101 (!) 105  Resp: 17 20 17 20   Temp:  98.7 F (37.1 C) 98.7 F (37.1 C) 98.8 F (37.1 C)  TempSrc:  Oral Oral Oral  SpO2:  99% 97% 93%  Weight:   (!) 182.1 kg   Height:       Physical Exam General: Obese man, NAD. Room air. Heart: RRR; no murmur Lungs: CTA anteriorly, unable to sit up for posterior exam Abdomen: soft Extremities: trace BLE edema Dialysis Access: [facility] - B in R chest  Additional Objective Labs: Basic Metabolic Panel: Recent Labs  Lab 03/17/23 0640 03/18/23 0550 03/19/23 0506 03/20/23 0643  NA 129* 128* 127* 126*  K 4.8 3.9 5.4* 5.1  CL 90* 88* 88* 87*  CO2 24 25 21* 20*  GLUCOSE 102* 113* 96 102*  BUN 58* 35* 48* 55*  CREATININE 6.77* 5.04* 6.37* 7.29*  CALCIUM 10.6* 10.3 10.6* 10.4*  PHOS 4.3 3.4 5.3*  --    Liver Function Tests: Recent Labs  Lab 03/17/23 0640 03/18/23 0550 03/19/23 0506  ALBUMIN 2.9* 2.9* 3.2*   CBC: Recent Labs  Lab 03/16/23 0610 03/17/23 0640 03/18/23 0550 03/19/23 0506 03/20/23 0643  WBC 7.8 8.2 7.2 9.1 8.1  HGB 8.1* 8.4* 8.1* 9.1* 8.9*  HCT 25.9* 26.6* 25.8* 29.1* 27.9*  MCV 102.8* 103.1* 103.2* 103.2* 104.1*  PLT 227 251 248 264 277   Medications:  sodium chloride 10 mL/hr at 02/21/23 1444   albumin human      (feeding supplement) PROSource Plus  30 mL Oral BID BM   apixaban  5 mg Oral BID   Chlorhexidine Gluconate Cloth  6 each Topical Q0600   cholecalciferol  1,000 Units Oral Daily   darbepoetin (ARANESP) injection - DIALYSIS   200 mcg Subcutaneous Q Sat-1800   digoxin  0.125 mg Oral Once per day on Monday Wednesday Friday   famotidine  10 mg Oral Daily   gabapentin  100 mg Oral TID   lidocaine  1 patch Transdermal Q24H   melatonin  5 mg Oral QHS   midodrine  30 mg Oral TID WC   multivitamin  1 tablet Oral QHS   mouth rinse  15 mL Mouth Rinse 4 times per day   senna-docusate  1 tablet Oral QHS   sevelamer carbonate  1,600 mg Oral TID WC    Dialysis Orders: [facility]  on TTS schedule  4.25hr, EDW 180kg  425/1.5  TDC, no heparin (HIT +), lock with Na Citrate   Problem/Plan: Acute on chronic hypotension: Able to wean off pressors on 8/3, on high dose midodrine 30mg  TID. Acute on chronic resp failure: Last extubated 7/20, has intermittently required BiPAP, now on 3-6L/min O2. A-fib with RVR: Refractory to DCCV (3 attempts) and amiodarone. Persistent A-fib/flutter - HR > 100. On Eliquis. Acute on chronic HFrEF: EF 20-25%. Per cardiology, not a candidate for nodal ablation, ICD, or pacemaker. Pneumococcal bacteremia/Enterobacter cloacae bacteremia. Admitted in septic shock, complex course. Last Blood cx +  on 8/13 - plan was for line holiday but felt risky due to his resp status - instead just exchanged over wire on 8/14. Now s/p abx course. ESRD: Usual TTS schedule. S/p CRRT 6/29-8/7. Gets extra HD at times. Now back to TTS schedule - next HD 8/27. Volume: Remains volume up with hyponatremia. Trying to keep him at 178 kg or lower. Reiterated fluid restrictions.  Needs to stay on full HD Tx Anemia of ESRD: Hgb 8.9 - continue Aranesp q Sat (max). Secondary HPTH: CorrCa high, VDRA on hold. Phos 5.3 - sevelamer resumed. Nutrition: Alb 3.2, continue supplements (changed Nepro to pro-source to limit volume). Decubitus ulcerations: Occ restlessness while on HD. Prognosis: Poor. Considering [facility].   Ozzie Hoyle, PA-C 03/21/2023, 9:52 AM  [facility]  Seen and examined independently.  Agree  with note and exam as documented above by physician extender and as noted here.  General adult male in bed in no acute distress, morbidly obese body habitus HEENT normocephalic atraumatic extraocular movements intact sclera anicteric Neck supple trachea midline Lungs clear to auscultation bilaterally normal work of breathing at rest on 5 liters oxygen  Heart S1S2 no rub  Abdomen soft nontender nondistended Extremities trace edema  Psych normal mood and affect RIJ tunn catheter  Hyperkalemia - HD today and tomorrow.  Ordered HD.  Patient agrees. 1k bath for 2 hours then transition to 2K. Discussed diet as well.  See order for lokelma as well - he does need HD today    ESRD - HD today and then per TTS schedule   Chronic hypotension - on midodrine   Estanislado Emms, MD 03/21/2023 1:13 PM

## 2023-03-21 NOTE — Progress Notes (Signed)
Physical Therapy Treatment Patient Details Name: Phillip Young MRN: [id number] DOB: 05/18/1986 Today's Date: 03/21/2023   History of Present Illness Pt is a 37 y/o M presenting to ED on 6/28 with fever, joint pain, and SOB after missing HD. Admitted for acute hypoxic respiratory failure. Intubated and started on CRRT, vasopressor support on 6/30. Extubated 7/20. Underwent DCCV 7/5, 7/6, and 7/7 without success. Pt off CRRT on 8/7.  PMH includes ESRD, NICM, refractory A fib, HFrEF    PT Comments  The pt continues to make slow but steady progress with LE strengthening and seated balance. He was able to tolerate for  >10 min during which time he completed LE and core strengthening activities and ADLs. The pt continues to demo significant weakness and limited power and endurance in BLE which combined with pain in bilateral feet limits his ability to bear wt and complete standing activities. Will continue to benefit from skilled PT acutely to progress functional strength, stability, and independence with mobility in the bed.    If plan is discharge home, recommend the following: Two people to help with walking and/or transfers;Two people to help with bathing/dressing/bathroom;Direct supervision/assist for medications management;Direct supervision/assist for financial management;Assist for transportation;Help with stairs or ramp for entrance   Can travel by private vehicle     No  Equipment Recommendations  Other (comment) (defer to post acute)    Recommendations for Other Services       Precautions / Restrictions Precautions Precautions: Fall Precaution Comments: wounds on hip/back/buttocks Restrictions Weight Bearing Restrictions: No     Mobility  Bed Mobility Overal bed mobility: Needs Assistance Bed Mobility: Rolling, Sidelying to Sit, Sit to Sidelying Rolling: Min assist Sidelying to sit: Mod assist, +2 for physical assistance     Sit to sidelying: Mod assist, +2 for physical  assistance General bed mobility comments: modA to elevate trunk and modA to bring LE into bed    Transfers                   General transfer comment: deferred to work on LE strengthening due to pain in ft with wt bearing    Ambulation/Gait                   Stairs             Wheelchair Mobility     Tilt Bed    Modified Rankin (Stroke Patients Only)       Balance Overall balance assessment: Needs assistance Sitting-balance support: No upper extremity supported Sitting balance-Leahy Scale: Good Sitting balance - Comments: maintained balance with dynamic reaching and perturbations without LOB                                    Cognition Arousal: Alert Behavior During Therapy: WFL for tasks assessed/performed Overall Cognitive Status: Within Functional Limits for tasks assessed                                 General Comments: pt motivated, needs encouragement at times        Exercises General Exercises - Lower Extremity Quad Sets: AROM, Both, 10 reps, Supine Long Arc Quad: Strengthening, Both, 10 reps, Seated (against min resistance) Heel Slides: Strengthening, Both, 10 reps, Seated (against min resistance) Hip ABduction/ADduction: Strengthening, Both, 10 reps, Seated (5 sec isometric hold) Hip Flexion/Marching:  AROM, Both, 10 reps, Seated Other Exercises Other Exercises: seated cross-body punches into pillow with LUE only    General Comments General comments (skin integrity, edema, etc.): VSS      Pertinent Vitals/Pain Pain Assessment Pain Assessment: Faces Faces Pain Scale: Hurts even more Pain Location: buttocks, B feet/ankles Pain Descriptors / Indicators: Discomfort, Aching, Grimacing, Burning Pain Intervention(s): Limited activity within patient's tolerance, Monitored during session, Repositioned, Heat applied    Home Living Family/patient expects to be discharged to:: Private residence                         Prior Function            PT Goals (current goals can now be found in the care plan section) Acute Rehab PT Goals Patient Stated Goal: to walk again PT Goal Formulation: With patient Time For Goal Achievement: 04/04/23 Potential to Achieve Goals: Fair Progress towards PT goals: Progressing toward goals    Frequency    Min 1X/week      PT Plan Current plan remains appropriate    Co-evaluation PT/OT/SLP Co-Evaluation/Treatment: Yes Reason for Co-Treatment: For patient/therapist safety;Complexity of the patient's impairments (multi-system involvement);To address functional/ADL transfers PT goals addressed during session: Mobility/safety with mobility;Strengthening/ROM OT goals addressed during session: ADL's and self-care;Strengthening/ROM      AM-PAC PT "6 Clicks" Mobility   Outcome Measure  Help needed turning from your back to your side while in a flat bed without using bedrails?: A Lot Help needed moving from lying on your back to sitting on the side of a flat bed without using bedrails?: Total Help needed moving to and from a bed to a chair (including a wheelchair)?: Total Help needed standing up from a chair using your arms (e.g., wheelchair or bedside chair)?: Total Help needed to walk in hospital room?: Total Help needed climbing 3-5 steps with a railing? : Total 6 Click Score: 7    End of Session Equipment Utilized During Treatment: Oxygen       PT Visit Diagnosis: Muscle weakness (generalized) (M62.81);Other abnormalities of gait and mobility (R26.89)     Time: 1324-4010 PT Time Calculation (min) (ACUTE ONLY): 26 min  Charges:    $Therapeutic Exercise: 8-22 mins PT General Charges $$ ACUTE PT VISIT: 1 Visit                     Vickki Muff, PT, DPT   Acute Rehabilitation Department Office [phone] Secure Chat Communication Preferred   Ronnie Derby 03/21/2023, 3:46 PM

## 2023-03-21 NOTE — Progress Notes (Signed)
   03/21/23 2305  BiPAP/CPAP/SIPAP  BiPAP/CPAP/SIPAP Pt Type Adult  BiPAP/CPAP/SIPAP V60  Mask Type Full face mask  Mask Size Extra large  Set Rate 8 breaths/min  Respiratory Rate 20 breaths/min  IPAP 16 cmH20  EPAP 8 cmH2O  Pressure Support 8 cmH20  PEEP 8 cmH20  FiO2 (%) 30 %  Minute Ventilation 15.6  Leak 39  Peak Inspiratory Pressure (PIP) 17  Tidal Volume (Vt) 632  Patient Home Equipment No  Press High Alarm 25 cmH2O  Press Low Alarm 5 cmH2O  Safety Check Completed by RT for Home Unit Yes, no issues noted

## 2023-03-21 NOTE — Progress Notes (Signed)
MD notified for Critical Lab value Potassium greater than 6, EKG done, confirmed HD for tonight.

## 2023-03-22 DIAGNOSIS — N186 End stage renal disease: Secondary | ICD-10-CM | POA: Diagnosis not present

## 2023-03-22 DIAGNOSIS — Z992 Dependence on renal dialysis: Secondary | ICD-10-CM | POA: Diagnosis not present

## 2023-03-22 DIAGNOSIS — J9601 Acute respiratory failure with hypoxia: Secondary | ICD-10-CM | POA: Diagnosis not present

## 2023-03-22 LAB — CBC
HCT: 28.3 % — ABNORMAL LOW (ref 39.0–52.0)
Hemoglobin: 9.1 g/dL — ABNORMAL LOW (ref 13.0–17.0)
MCH: 33.1 pg (ref 26.0–34.0)
MCHC: 32.2 g/dL (ref 30.0–36.0)
MCV: 102.9 fL — ABNORMAL HIGH (ref 80.0–100.0)
Platelets: 336 10*3/uL (ref 150–400)
RBC: 2.75 MIL/uL — ABNORMAL LOW (ref 4.22–5.81)
RDW: 17.9 % — ABNORMAL HIGH (ref 11.5–15.5)
WBC: 8.1 10*3/uL (ref 4.0–10.5)
nRBC: 0 % (ref 0.0–0.2)

## 2023-03-22 LAB — RENAL FUNCTION PANEL
Albumin: 3.4 g/dL — ABNORMAL LOW (ref 3.5–5.0)
Anion gap: 15 (ref 5–15)
BUN: 36 mg/dL — ABNORMAL HIGH (ref 6–20)
CO2: 25 mmol/L (ref 22–32)
Calcium: 9.7 mg/dL (ref 8.9–10.3)
Chloride: 90 mmol/L — ABNORMAL LOW (ref 98–111)
Creatinine, Ser: 5.03 mg/dL — ABNORMAL HIGH (ref 0.61–1.24)
GFR, Estimated: 14 mL/min — ABNORMAL LOW (ref >60)
Glucose, Bld: 101 mg/dL — ABNORMAL HIGH (ref 70–99)
Phosphorus: 3.4 mg/dL (ref 2.5–4.6)
Potassium: 4.4 mmol/L (ref 3.5–5.1)
Sodium: 130 mmol/L — ABNORMAL LOW (ref 135–145)

## 2023-03-22 LAB — DIGOXIN LEVEL: Digoxin Level: 0.9 ng/mL (ref 0.8–2.0)

## 2023-03-22 LAB — GLUCOSE, CAPILLARY
Glucose-Capillary: 101 mg/dL — ABNORMAL HIGH (ref 70–99)
Glucose-Capillary: 121 mg/dL — ABNORMAL HIGH (ref 70–99)
Glucose-Capillary: 103 mg/dL — ABNORMAL HIGH (ref 70–99)

## 2023-03-22 MED ORDER — CHLORHEXIDINE GLUCONATE CLOTH 2 % EX PADS
6.0000 | MEDICATED_PAD | Freq: Every day | CUTANEOUS | Status: DC
  Administered 2023-03-22 – 2023-03-23 (×2): 6 via TOPICAL

## 2023-03-22 MED ORDER — ANTICOAGULANT SODIUM CITRATE 4% (200MG/5ML) IV SOLN
5.0000 mL | Freq: Once | Status: DC
  Filled 2023-03-22: qty 5

## 2023-03-22 NOTE — Progress Notes (Signed)
Speech Language Pathology Treatment: Dysphagia  Patient Details Name: Phillip Young MRN: [id number] DOB: [birth year] Today's Date: 03/22/2023 Time: 7829-5621 SLP Time Calculation (min) (ACUTE ONLY): 15 min  Assessment / Plan / Recommendation Clinical Impression  Patient seen by SLP for skilled treatment focused on dysphagia goals. When SLP arrived in room, he was in bed, semi-reclined and feeding himself from lunch meal tray. No overt s/s aspiration observed during or after PO's even with patient not in ideal position for swallow safety. No change in voice or vitals. As patient has been on regular texture solids diet since 03/10/23 without any documented or reported difficulties in swallowing, SLP to s/o at this time and recommend continuing on regular texture solids, thin liquids. Please re-consult as needed.    HPI HPI: Pt is a 37 year old male who presented with fever, joint pains, SOB. ETT 6/30-7/20.  As of 8/8, off pressors and CRRT with plans to start HD in room. PMH: ESRD, GERD, NICM, refractory AFib, ?amio-induced lung toxicity.      SLP Plan  All goals met;Discharge SLP treatment due to (comment)      Recommendations for follow up therapy are one component of a multi-disciplinary discharge planning process, led by the attending physician.  Recommendations may be updated based on patient status, additional functional criteria and insurance authorization.    Recommendations  Diet recommendations: Regular;Thin liquid Liquids provided via: Cup;Straw Medication Administration: Whole meds with puree Supervision: Patient able to self feed;Intermittent supervision to cue for compensatory strategies Compensations: Slow rate;Small sips/bites;Follow solids with liquid Postural Changes and/or Swallow Maneuvers: Seated upright 90 degrees                  Oral care BID   Intermittent Supervision/Assistance Dysphagia, oropharyngeal phase (R13.12)     All goals met;Discharge SLP  treatment due to (comment)     Angela Nevin, MA, CCC-SLP Speech Therapy

## 2023-03-22 NOTE — Progress Notes (Signed)
Received IV team consult for PIV placement for albumin administration. Per MAR, albumin is to be given during dialysis for blood pressure support. Clarified order with primary RN. Per MD Geraldo Pitter, ok to not give today and can wait for final nephro recommendations.

## 2023-03-22 NOTE — Progress Notes (Addendum)
[facility] Progress Note   Subjective:  Seen in room - dialyzed overnight for hyperkalemia, got 5L off. He enjoyed HD overnight. Denies CP/dyspnea today. Of note - labs checked this AM were done while on HD - > not accurate.    Objective Vitals:   03/22/23 0530 03/22/23 0600 03/22/23 0630 03/22/23 0700  BP: 99/70 116/62 107/67 102/64  Pulse: (!) 149 92 (!) 116 97  Resp: (!) 25 (!) 25 (!) 26 (!) 24  Temp:    98.6 F (37 C)  TempSrc:    Oral  SpO2: 96% 100% (!) 80% 96%  Weight:      Height:       Physical Exam General: Obese man, NAD. Room air. Heart: RRR; no murmur Lungs: CTA anteriorly, unable to sit up for posterior exam Abdomen: soft Extremities: trace BLE edema Dialysis Access: [facility] in R chest  Additional Objective Labs: Basic Metabolic Panel: Recent Labs  Lab 03/21/23 0841 03/21/23 1522 03/22/23 0650  NA 125* 125* 130*  K 6.4* 6.7* 4.4  CL 87* 86* 90*  CO2 18* 21* 25  GLUCOSE 95 96 101*  BUN 73* 76* 36*  CREATININE 8.82* 9.19* 5.03*  CALCIUM 10.5* 10.6* 9.7  PHOS 5.6* 6.2* 3.4   Liver Function Tests: Recent Labs  Lab 03/21/23 0841 03/21/23 1522 03/22/23 0650  ALBUMIN 3.2* 3.1* 3.4*   CBC: Recent Labs  Lab 03/18/23 0550 03/19/23 0506 03/20/23 0643 03/21/23 0841 03/22/23 0600  WBC 7.2 9.1 8.1 8.9 8.1  HGB 8.1* 9.1* 8.9* 8.4* 9.1*  HCT 25.8* 29.1* 27.9* 26.3* 28.3*  MCV 103.2* 103.2* 104.1* 101.2* 102.9*  PLT 248 264 277 307 336   Medications:  sodium chloride 10 mL/hr at 02/21/23 1444   albumin human     anticoagulant sodium citrate      (feeding supplement) PROSource Plus  30 mL Oral BID BM   apixaban  5 mg Oral BID   Chlorhexidine Gluconate Cloth  6 each Topical Q0600   Chlorhexidine Gluconate Cloth  6 each Topical Q0600   cholecalciferol  1,000 Units Oral Daily   darbepoetin (ARANESP) injection - DIALYSIS  200 mcg Subcutaneous Q Sat-1800   insulin aspart  5 Units Intravenous Once   And   dextrose  1 ampule  Intravenous Once   digoxin  0.125 mg Oral Once per day on Monday Wednesday Friday   famotidine  10 mg Oral Daily   gabapentin  100 mg Oral TID   lidocaine  1 patch Transdermal Q24H   melatonin  5 mg Oral QHS   midodrine  30 mg Oral TID WC   multivitamin  1 tablet Oral QHS   mouth rinse  15 mL Mouth Rinse 4 times per day   pneumococcal 20-valent conjugate vaccine  0.5 mL Intramuscular Tomorrow-1000   senna-docusate  1 tablet Oral QHS   sevelamer carbonate  1,600 mg Oral TID WC   sodium bicarbonate  50 mEq Intravenous Once    Dialysis Orders: [facility]  on TTS schedule  4.25hr, EDW 180kg  425/1.5  TDC, no heparin (HIT +), lock with Na Citrate   Problem/Plan: Acute on chronic hypotension: Able to wean off pressors on 8/3, on high dose midodrine 30mg  TID. Acute on chronic resp failure: Last extubated 7/20, has intermittently required BiPAP, now on 3-6L/min O2. A-fib with RVR: Refractory to DCCV (3 attempts) and amiodarone. Persistent A-fib/flutter - HR > 100. On Eliquis. Acute on chronic HFrEF: EF 20-25%. Per cardiology, not a candidate for nodal  ablation, ICD, or pacemaker. Pneumococcal bacteremia/Enterobacter cloacae bacteremia. Admitted in septic shock, complex course. Last Blood cx + on 8/13 - plan was for line holiday but felt risky due to his resp status - instead just exchanged over wire on 8/14. Now s/p abx course. ESRD: Usual TTS schedule. S/p CRRT 6/29-8/7. Gets extra HD at times. HD done overnight (early) due to hyperkalemia. Reassess in AM - most likely will need HD tomorrow (Wed 8/28), then will get back on usual schedule at some point - reiterated low K diet. Volume: Remains volume up with hyponatremia. Trying to keep him at 178 kg or lower. Reiterated fluid restrictions.  Needs to stay on full HD Tx Anemia of ESRD: Hgb 9.1 - continue Aranesp q Sat (max). Secondary HPTH: CorrCa high, VDRA on hold. Phos better - continue sevelamer. Nutrition: Alb 3.4, continue supplements  (changed Nepro to pro-source to limit volume). Decubitus ulcerations: Occ restlessness while on HD. Prognosis: Poor. Considering [facility].   Ozzie Hoyle, PA-C 03/22/2023, 10:31 AM   [facility]  Seen and examined independently.  Agree with note and exam as documented above by physician extender and as noted here.  General adult male in bed in no acute distress, morbidly obese body habitus HEENT normocephalic atraumatic extraocular movements intact sclera anicteric Neck supple trachea midline Lungs clear to auscultation bilaterally normal work of breathing at rest on supplemental oxygen  Heart S1S2 no rub  Abdomen soft nontender distended/obese habitus Extremities trace edema  Psych normal mood and affect RIJ tunn catheter  Hyperkalemia - s/p HD today as below.  Labs not accurate.     ESRD - got HD earlier today (delayed due to staffing).  Plan for additional HD tomorrow and then back on TTS schedule    Chronic hypotension - on midodrine     Estanislado Emms, MD 03/22/2023 12:56 PM

## 2023-03-22 NOTE — Plan of Care (Signed)
  Problem: Education: Goal: Knowledge of General Education information will improve Description: Including pain rating scale, medication(s)/side effects and non-pharmacologic comfort measures Outcome: Progressing   Problem: Health Behavior/Discharge Planning: Goal: Ability to manage health-related needs will improve Outcome: Progressing   Problem: Clinical Measurements: Goal: Ability to maintain clinical measurements within normal limits will improve Outcome: Progressing Goal: Respiratory complications will improve Outcome: Progressing   Problem: Activity: Goal: Risk for activity intolerance will decrease Outcome: Progressing   Problem: Nutrition: Goal: Adequate nutrition will be maintained Outcome: Progressing   Problem: Coping: Goal: Level of anxiety will decrease Outcome: Progressing   Problem: Elimination: Goal: Will not experience complications related to bowel motility Outcome: Progressing   Problem: Pain Managment: Goal: General experience of comfort will improve Outcome: Progressing   Problem: Skin Integrity: Goal: Risk for impaired skin integrity will decrease Outcome: Progressing

## 2023-03-22 NOTE — Progress Notes (Addendum)
HD#60 SUBJECTIVE:  Patient Summary: Phillip Young is a 37 y.o. with a pertinent PMH of ESRD on TTS HD, HFrEF with EF 20-25%, refractory Afib, who presented with fevers, joint pains, SOB and admitted for Acute on chronic hypoxic respiratory failure, ESRD on HD, and septis (now resolved).   Overnight Events: Pt to HD @ 230am  Interim History: Tolerated HD overnight. He enjoys late HD. Pain controlled with oxy.   OBJECTIVE:  Vital Signs: Vitals:   03/22/23 0330 03/22/23 0350 03/22/23 0400 03/22/23 0430  BP: 114/81 108/77 117/75 102/70  Pulse: 89 94 99 90  Resp: 20 20 (!) 22 16  Temp:  98.7 F (37.1 C)    TempSrc:  Oral    SpO2: 96% 95% 95% 97%  Weight:      Height:       Supplemental O2: Nasal Cannula SpO2: 97 % O2 Flow Rate (L/min): 3 L/min FiO2 (%): 30 %  Filed Weights   03/18/23 0524 03/19/23 0500 03/21/23 0341  Weight: (!) 175.9 kg (!) 175.8 kg (!) 182.1 kg     Intake/Output Summary (Last 24 hours) at 03/22/2023 0721 Last data filed at 03/21/2023 1300 Gross per 24 hour  Intake 360 ml  Output --  Net 360 ml   Net IO Since Admission: 1,829.08 mL [03/22/23 0721]  Physical Exam: Physical Exam Constitutional:      Appearance: Normal appearance. He is obese.  Cardiovascular:     Rate and Rhythm: Normal rate and regular rhythm.     Pulses: Normal pulses.     Heart sounds: Normal heart sounds.  Pulmonary:     Effort: Pulmonary effort is normal.     Breath sounds: Normal breath sounds.  Abdominal:     General: Abdomen is flat. Bowel sounds are normal.     Palpations: Abdomen is soft.     Tenderness: There is no abdominal tenderness.  Musculoskeletal:     Right lower leg: No edema.     Left lower leg: No edema.  Skin:    Findings: Lesion present.     Comments: L posterior thigh ulcer. Clean bandage intact.  Neurological:     General: No focal deficit present.     Mental Status: He is alert and oriented to person, place, and time.     Patient  Lines/Drains/Airways Status     Active Line/Drains/Airways     Name Placement date Placement time Site Days   Hemodialysis Catheter Right Internal jugular Double lumen Permanent (Tunneled) 03/09/23  0933  Internal jugular  13   Pressure Injury 02/03/23 Coccyx Mid Stage 2 -  Partial thickness loss of dermis presenting as a shallow open injury with a red, pink wound bed without slough. 02/03/23  0800  -- 47   Pressure Injury 02/08/23 Hip Left;Upper;Lateral;Posterior Deep Tissue Pressure Injury - Purple or maroon localized area of discolored intact skin or blood-filled blister due to damage of underlying soft tissue from pressure and/or shear. Full thickness w 02/08/23  1200  -- 42   Wound / Incision (Open or Dehisced) 02/04/23 Skin tear Hip Left;Posterior small circular skin tears on left hip and back 02/04/23  1345  Hip  46   Wound / Incision (Open or Dehisced) 02/08/23 (IAD) Incontinence Associated Dermatitis Buttocks Right;Left;Posterior Incontinence associated skin damage 02/08/23  0800  Buttocks  42             ASSESSMENT/PLAN:  Assessment: Principal Problem:   Acute hypoxic respiratory failure ([name]) Active Problems:   Morbid  obesity ([name])   Chronic systolic CHF (congestive heart failure) ([name])   Cardiogenic shock ([name])   ESRD (end stage renal disease) ([name])   ESRD (end stage renal disease) on dialysis ([name])   ESRD on dialysis ([name])   Hypervolemia   Sepsis with acute organ dysfunction without septic shock ([name])   Congestive heart failure ([name])   Atrial fibrillation ([name])   Pressure injury of skin   Central line infection   Hyperkalemia  Patient Summary: [facility] is a 37 y.o. with a pertinent PMH of chronic hypoxic respiratory failure, ESRD, HFrEF with EF 20-25%, refractory Afib, amiodarone induced lung toxicity, who presented with fevers, joint pains, SOB and admitted for acute on chronic hypoxic respiratory failure, ESRD on HD, and septis (now resolved).    Pt is  stable overall. Right now, primarily managing chronic pain, monitoring L thigh ulcer and breathing, in anticipation of discharge to LTAC. Will stop labs for now. Continued HD on TTS.  #Acute on chronic HFrEF with pulmonary edema #Paroxysmal Afib/flutter with RVR #Nonischemic cardiomyopathy EF less than 20% -Remains in and out of Afib with HR ranging up to 150s -Patient intolerant of metoprolol and carvedilol due to allergy, amiodarone due to lung toxicity, diltiazem due to extremely reduced EF -Cardiology signed off, however recommending digoxin every other day (non-dialysis days) and frequent digoxin levels. Appreciate recs -Continue Eliquis for stroke prophylaxis -Palliative consulted, no new recs from previous consultation. Appreciate assistance -TOC consulted for [facility] placement, pending authorization  #ESRD on HD T/Th/S with hypotension #Recurrent metabolic acidosis, improving S/P TDC repair on 8/14, has been receiving dialysis. Per notation, patient has consistently required midodrine and haldol in order to tolerate HD. HD schedule T/Th/Sa. -Nephrology following, appreciate assistance -IR replaced [facility] 8/14, appreciate assistance. -Patient has had trouble tolerating due to skin breakdown and TDC clotting. Haldol as needed for agitation -Midodrine 30 TID for hypotension  #Acute on chronic hypoxemic/hypercapnic respiratory failure #Acute metabolic encephalopathy in setting of hypercapnia (resolved) Patient was transferred from ICU on 8/9 after extubation and pressors were discontinued. Mental status has improved, patient is currently alert and following commands. Patient is questionably adherent with home CPAP -3L HFNC with O2 sats in mid 90s -Encourage incentive spirometry and PT/OT  #Hypervolemic hyponatremia #Hyperphosphatemia #Hyperkalemia Recurrent hyperkalemia. - K elevated to 6.7 yesterday. Previous HD on Saturday was cut short due to pt pain. Yesterday, ECG without changes,  Lokelma provided but insulin held. He went to HD overnight, renal panel improved. Pt remains asymptomatic. - Dialysis planned for W then return to TTS schedule - Now stable, will stop regular labs for now. Appreciate nephrology's continued help, will let them do lab checks as they feel necessary. -Tube feeds d/c 8/12. Monitor blood glucose with frequent checks   #L posterior thigh decubitus ulcer  Wound care reconsulted yesterday 8/26 per patient concern of wound drainage and depth. Ongoing skin breakdown has made patient intolerant of dialysis. He was previously evaluated on 8/23. Appreciate the help. - Note increased wound depth to 2cm. Continue aquacel, fill wound depth, silicone foam dressing, daily dressing changes - Continue oxycodone 10mg , pain improved, increase to 15mg  if needed   #Chronic Pain 2/2 Deconditioning #Lower Extremity Numbness  Due to pt's chronic illnesses, he has been bed bound and complains of diffuse pain, mostly in his feet. Unfortunately, gabapentin therapy is limited due to ESRD, and he is on max safe dose for this. His current regimen includes dilaudid 1mg  PO q4 PRN, which he states is unaffective. -  Resume home gabapentin - Continue oxycodone 10mg  po q4. Pt reports he usually needs 15mg  for management, will evaluate in time. Dilaudid did not control pain.    #Anemia of chronic disease Monitor with daily labs, transfuse as needed   #Skin breakdown Perineal and sacral skin breakdown. Patient refuses rectal tube -Continue local wound care -Wound care reconsulted, no new recs   #Bacteremia, resolved Blood cultures on 8/15 positive for enterobacter cloacae. WBC count has resolved to normal limits at. Unable to tolerate line holiday given needs for dialysis. Repeat blood cultures showing no growth. Treated with cefepime starting 8/14, transitioned to levaquin 8/20, abx stopped 8/23    #Mixed Septic and Cardiogenic Shock -Resolved, transferred to floor from ICU on  8/9  Best Practice: Diet: Regular diet and Renal diet IVF: None VTE: DOAC Code: Full AB: none DISPO: Anticipated discharge ASAP to  LTAC  pending  Bed availability .  Signature: [name], D.O.  Internal Medicine Resident, PGY-1 [facility]  Pager: [phone] 7:21 AM, 03/22/2023   Please contact the on call pager after 5 pm and on weekends at [phone].

## 2023-03-22 NOTE — Progress Notes (Addendum)
   03/22/23 0700  Vitals  Temp 98.6 F (37 C)  Temp Source Oral  BP 102/64  MAP (mmHg) 77  BP Location Right Wrist  BP Method Automatic  Patient Position (if appropriate) Lying  Pulse Rate 97  Pulse Rate Source Monitor  ECG Heart Rate 99  Resp (!) 24  Oxygen Therapy  SpO2 96 %  During Treatment Monitoring  Blood Flow Rate (mL/min) 350 mL/min  Arterial Pressure (mmHg) -204.03 mmHg  Venous Pressure (mmHg) 153.75 mmHg  TMP (mmHg) 34.95 mmHg  Ultrafiltration Rate (mL/min) 1507 mL/min  Dialysate Flow Rate (mL/min) 300 ml/min  Dialysate Potassium Concentration 2  Dialysate Calcium Concentration 2.5  Duration of HD Treatment -hour(s) 4 hour(s)  Cumulative Fluid Removed (mL) per Treatment  2952.84  HD Safety Checks Performed Yes  Intra-Hemodialysis Comments Tx completed  Post Treatment  Dialyzer Clearance Lightly streaked  Liters Processed 84  Fluid Removed (mL) 5000 mL  Tolerated HD Treatment Yes  Post-Hemodialysis Comments Pt remain stable  Hemodialysis Catheter Right Internal jugular Double lumen Permanent (Tunneled)  Placement Date/Time: 03/09/23 0933   Serial / Lot #: [id number]  Expiration Date: 09/23/27  Time Out: Correct patient;Correct site;Correct procedure  Maximum sterile barrier precautions: Hand hygiene;Large sterile sheet;Cap;Sterile probe cover;Mask;Ste...  Site Condition No complications  Blue Lumen Status  (Cirtate instill in catheter)  Red Lumen Status Dead end cap in place (citrate instill 1.6)  Catheter fill volume (Arterial) 1.6 cc  Catheter fill volume (Venous) 1.6  Dressing Type Transparent  Dressing Status Clean, Dry, Intact;Antimicrobial disc in place  Drainage Description None  Dressing Change Due 03/28/23  Post treatment catheter status Capped and Clamped   5000 ml UF, time 4 hours. Dr4essing changed. Pt stable.Handoff to [employer], Amy RN.

## 2023-03-22 NOTE — Progress Notes (Signed)
On call internal med & neph updated this shift On call internal med updated re: pt has no access Pt to HD this shift

## 2023-03-22 NOTE — Plan of Care (Signed)
Full nephrology note to follow.  Seen after HD this AM.  Note that labs appear to have been drawn while on HD thus not accurate.    Delay in getting him dialyzed yesterday due to staffing and he just completed treatment.    I let the patient know that given his K I anticipated HD on Wednesday and Thursday then back to TTS schedule  Estanislado Emms, MD 7:45 AM 03/22/2023

## 2023-03-22 NOTE — Progress Notes (Signed)
This chaplain responded to the RN consult for creating/updating the Pt. Advance Directive. The Pt. is awake and willing to participate in AD education. The Pt. [name] is present at the bedside.  The Pt. answered clarifying questions and proceeded with filling out the HCPOA only. [name] checked for accuracy.  The Pt. chose [facility] as his healthcare agent. The Pt. second choice, if the healthcare agent is unwilling or unable to serve in this role is [facility]. The Pt. is not completing a Living Will.  The chaplain placed the Pt. AD in his chart. Notaries are not available this afternoon. This chaplain will F/U on Wednesday.  Chaplain Stephanie Acre [phone]

## 2023-03-23 ENCOUNTER — Inpatient Hospital Stay
Admit: 2023-03-23 | Discharge: 2023-04-21 | Disposition: A | Discharge status: Skilled Nursing Facility | Payer: BC Managed Care – PPO | Source: Other Acute Inpatient Hospital

## 2023-03-23 DIAGNOSIS — J9621 Acute and chronic respiratory failure with hypoxia: Secondary | ICD-10-CM | POA: Diagnosis not present

## 2023-03-23 DIAGNOSIS — I4819 Other persistent atrial fibrillation: Secondary | ICD-10-CM | POA: Diagnosis not present

## 2023-03-23 DIAGNOSIS — J9601 Acute respiratory failure with hypoxia: Secondary | ICD-10-CM | POA: Diagnosis not present

## 2023-03-23 DIAGNOSIS — Z6841 Body Mass Index (BMI) 40.0 and over, adult: Secondary | ICD-10-CM

## 2023-03-23 DIAGNOSIS — E876 Hypokalemia: Secondary | ICD-10-CM | POA: Diagnosis not present

## 2023-03-23 DIAGNOSIS — I482 Chronic atrial fibrillation, unspecified: Secondary | ICD-10-CM | POA: Diagnosis not present

## 2023-03-23 DIAGNOSIS — I5023 Acute on chronic systolic (congestive) heart failure: Secondary | ICD-10-CM | POA: Diagnosis not present

## 2023-03-23 DIAGNOSIS — E039 Hypothyroidism, unspecified: Secondary | ICD-10-CM | POA: Diagnosis not present

## 2023-03-23 DIAGNOSIS — I5082 Biventricular heart failure: Secondary | ICD-10-CM | POA: Diagnosis not present

## 2023-03-23 DIAGNOSIS — D631 Anemia in chronic kidney disease: Secondary | ICD-10-CM | POA: Diagnosis not present

## 2023-03-23 DIAGNOSIS — J9610 Chronic respiratory failure, unspecified whether with hypoxia or hypercapnia: Secondary | ICD-10-CM | POA: Diagnosis not present

## 2023-03-23 DIAGNOSIS — L89213 Pressure ulcer of right hip, stage 3: Secondary | ICD-10-CM | POA: Diagnosis not present

## 2023-03-23 DIAGNOSIS — E875 Hyperkalemia: Secondary | ICD-10-CM | POA: Diagnosis not present

## 2023-03-23 DIAGNOSIS — E662 Morbid (severe) obesity with alveolar hypoventilation: Secondary | ICD-10-CM | POA: Diagnosis not present

## 2023-03-23 DIAGNOSIS — L89310 Pressure ulcer of right buttock, unstageable: Secondary | ICD-10-CM | POA: Diagnosis not present

## 2023-03-23 DIAGNOSIS — I132 Hypertensive heart and chronic kidney disease with heart failure and with stage 5 chronic kidney disease, or end stage renal disease: Secondary | ICD-10-CM | POA: Diagnosis not present

## 2023-03-23 DIAGNOSIS — I959 Hypotension, unspecified: Secondary | ICD-10-CM | POA: Diagnosis not present

## 2023-03-23 DIAGNOSIS — E669 Obesity, unspecified: Secondary | ICD-10-CM | POA: Diagnosis not present

## 2023-03-23 DIAGNOSIS — D638 Anemia in other chronic diseases classified elsewhere: Secondary | ICD-10-CM | POA: Diagnosis not present

## 2023-03-23 DIAGNOSIS — R6520 Severe sepsis without septic shock: Secondary | ICD-10-CM | POA: Diagnosis not present

## 2023-03-23 DIAGNOSIS — R570 Cardiogenic shock: Secondary | ICD-10-CM | POA: Diagnosis not present

## 2023-03-23 DIAGNOSIS — L89893 Pressure ulcer of other site, stage 3: Secondary | ICD-10-CM | POA: Diagnosis not present

## 2023-03-23 DIAGNOSIS — I509 Heart failure, unspecified: Secondary | ICD-10-CM | POA: Diagnosis not present

## 2023-03-23 DIAGNOSIS — N186 End stage renal disease: Secondary | ICD-10-CM | POA: Diagnosis not present

## 2023-03-23 DIAGNOSIS — N2581 Secondary hyperparathyroidism of renal origin: Secondary | ICD-10-CM | POA: Diagnosis not present

## 2023-03-23 DIAGNOSIS — Z7901 Long term (current) use of anticoagulants: Secondary | ICD-10-CM | POA: Diagnosis not present

## 2023-03-23 DIAGNOSIS — G629 Polyneuropathy, unspecified: Secondary | ICD-10-CM | POA: Diagnosis not present

## 2023-03-23 LAB — BASIC METABOLIC PANEL
Anion gap: 20 — ABNORMAL HIGH (ref 5–15)
BUN: 57 mg/dL — ABNORMAL HIGH (ref 6–20)
CO2: 19 mmol/L — ABNORMAL LOW (ref 22–32)
Calcium: 10.3 mg/dL (ref 8.9–10.3)
Chloride: 89 mmol/L — ABNORMAL LOW (ref 98–111)
Creatinine, Ser: 7.64 mg/dL — ABNORMAL HIGH (ref 0.61–1.24)
GFR, Estimated: 9 mL/min — ABNORMAL LOW (ref >60)
Glucose, Bld: 99 mg/dL (ref 70–99)
Potassium: 4.7 mmol/L (ref 3.5–5.1)
Sodium: 128 mmol/L — ABNORMAL LOW (ref 135–145)

## 2023-03-23 LAB — CBC WITH DIFFERENTIAL/PLATELET
Abs Immature Granulocytes: 0.02 10*3/uL (ref 0.00–0.07)
Basophils Absolute: 0.1 10*3/uL (ref 0.0–0.1)
Basophils Relative: 1 %
Eosinophils Absolute: 0.6 10*3/uL — ABNORMAL HIGH (ref 0.0–0.5)
Eosinophils Relative: 8 %
HCT: 28.3 % — ABNORMAL LOW (ref 39.0–52.0)
Hemoglobin: 9.1 g/dL — ABNORMAL LOW (ref 13.0–17.0)
Immature Granulocytes: 0 %
Lymphocytes Relative: 14 %
Lymphs Abs: 1 10*3/uL (ref 0.7–4.0)
MCH: 33.8 pg (ref 26.0–34.0)
MCHC: 32.2 g/dL (ref 30.0–36.0)
MCV: 105.2 fL — ABNORMAL HIGH (ref 80.0–100.0)
Monocytes Absolute: 0.9 10*3/uL (ref 0.1–1.0)
Monocytes Relative: 13 %
Neutro Abs: 4.5 10*3/uL (ref 1.7–7.7)
Neutrophils Relative %: 64 %
Platelets: 316 10*3/uL (ref 150–400)
RBC: 2.69 MIL/uL — ABNORMAL LOW (ref 4.22–5.81)
RDW: 18.1 % — ABNORMAL HIGH (ref 11.5–15.5)
WBC: 7.2 10*3/uL (ref 4.0–10.5)
nRBC: 0 % (ref 0.0–0.2)

## 2023-03-23 LAB — GLUCOSE, CAPILLARY
Glucose-Capillary: 87 mg/dL (ref 70–99)
Glucose-Capillary: 100 mg/dL — ABNORMAL HIGH (ref 70–99)
Glucose-Capillary: 97 mg/dL (ref 70–99)

## 2023-03-23 MED ORDER — GUAIFENESIN-DM 100-10 MG/5ML PO SYRP: 5.0000 mL | ORAL_SOLUTION | ORAL | Status: DC | PRN

## 2023-03-23 MED ORDER — DARBEPOETIN ALFA 200 MCG/0.4ML IJ SOSY: 200.0000 ug | PREFILLED_SYRINGE | INTRAMUSCULAR | Status: DC

## 2023-03-23 MED ORDER — SENNOSIDES-DOCUSATE SODIUM 8.6-50 MG PO TABS: 1.0000 | ORAL_TABLET | Freq: Every day | ORAL | Status: DC

## 2023-03-23 MED ORDER — CHLORHEXIDINE GLUCONATE CLOTH 2 % EX PADS: 6.0000 | MEDICATED_PAD | Freq: Every day | CUTANEOUS | Status: DC

## 2023-03-23 MED ORDER — FAMOTIDINE 10 MG PO TABS: 10.0000 mg | ORAL_TABLET | Freq: Every day | ORAL | Status: DC

## 2023-03-23 MED ORDER — GABAPENTIN 100 MG PO CAPS: 100.0000 mg | ORAL_CAPSULE | Freq: Three times a day (TID) | ORAL | Status: DC

## 2023-03-23 MED ORDER — PROSOURCE PLUS PO LIQD: 30.0000 mL | Freq: Two times a day (BID) | ORAL | Status: DC

## 2023-03-23 MED ORDER — HALOPERIDOL LACTATE 5 MG/ML IJ SOLN: 1.0000 mg | Freq: Two times a day (BID) | INTRAMUSCULAR | Status: DC | PRN

## 2023-03-23 MED ORDER — OXYCODONE HCL 15 MG PO TABS: 15.0000 mg | ORAL_TABLET | ORAL | Status: DC | PRN

## 2023-03-23 MED ORDER — SODIUM ZIRCONIUM CYCLOSILICATE 10 G PO PACK
10.0000 g | PACK | ORAL | Status: DC
  Filled 2023-03-23: qty 1

## 2023-03-23 MED ORDER — SODIUM ZIRCONIUM CYCLOSILICATE 10 G PO PACK: 10.0000 g | PACK | ORAL | Status: DC

## 2023-03-23 MED ORDER — ANTICOAGULANT SODIUM CITRATE 4% (200MG/5ML) IV SOLN: 5.0000 mL | Freq: Once | Status: DC

## 2023-03-23 MED ORDER — POLYETHYLENE GLYCOL 3350 17 G PO PACK: 17.0000 g | PACK | Freq: Every day | ORAL | Status: DC | PRN

## 2023-03-23 MED ORDER — DIGOXIN 125 MCG PO TABS: 0.1250 mg | ORAL_TABLET | ORAL | Status: DC

## 2023-03-23 MED ORDER — LIDOCAINE 5 % EX PTCH: 1.0000 | MEDICATED_PATCH | CUTANEOUS | Status: DC

## 2023-03-23 MED ORDER — ACETAMINOPHEN 160 MG/5ML PO SOLN: 650.0000 mg | Freq: Four times a day (QID) | ORAL | Status: DC | PRN

## 2023-03-23 MED ORDER — DOCUSATE SODIUM 100 MG PO CAPS: 100.0000 mg | ORAL_CAPSULE | Freq: Two times a day (BID) | ORAL | Status: DC | PRN

## 2023-03-23 MED ORDER — OXYCODONE HCL 5 MG PO TABS
15.0000 mg | ORAL_TABLET | ORAL | Status: DC | PRN
  Administered 2023-03-23 (×2): 15 mg via ORAL
  Filled 2023-03-23 (×2): qty 3

## 2023-03-23 MED ORDER — ORAL CARE MOUTH RINSE: 15.0000 mL | Freq: Four times a day (QID) | OROMUCOSAL | Status: DC

## 2023-03-23 MED ORDER — ALBUMIN HUMAN 25 % IV SOLN: 25.0000 g | INTRAVENOUS | Status: DC

## 2023-03-23 MED ORDER — HYDROXYZINE HCL 10 MG PO TABS: 10.0000 mg | ORAL_TABLET | Freq: Every day | ORAL | Status: DC | PRN

## 2023-03-23 MED ORDER — HYDROCORTISONE 1 % EX CREA: 1.0000 | TOPICAL_CREAM | Freq: Three times a day (TID) | CUTANEOUS | Status: DC | PRN

## 2023-03-23 MED ORDER — SURGILUBE EX GEL: CUTANEOUS | Status: DC | PRN

## 2023-03-23 MED ORDER — RENA-VITE PO TABS: 1.0000 | ORAL_TABLET | Freq: Every day | ORAL | Status: DC

## 2023-03-23 MED ORDER — MIDODRINE HCL 10 MG PO TABS: 30.0000 mg | ORAL_TABLET | Freq: Three times a day (TID) | ORAL | Status: DC

## 2023-03-23 MED ORDER — VITAMIN D3 25 MCG PO TABS: 1000.0000 [IU] | ORAL_TABLET | Freq: Every day | ORAL | Status: DC

## 2023-03-23 MED ORDER — BISACODYL 10 MG RE SUPP: 10.0000 mg | Freq: Every day | RECTAL | Status: DC | PRN

## 2023-03-23 MED ORDER — POLYVINYL ALCOHOL 1.4 % OP SOLN: 1.0000 [drp] | OPHTHALMIC | Status: DC | PRN

## 2023-03-23 MED ORDER — LIDOCAINE 4 % EX CREA: TOPICAL_CREAM | Freq: Four times a day (QID) | CUTANEOUS | Status: DC | PRN

## 2023-03-23 MED ORDER — ACETAMINOPHEN 325 MG PO TABS: 650.0000 mg | ORAL_TABLET | Freq: Four times a day (QID) | ORAL | Status: DC | PRN

## 2023-03-23 NOTE — Consult Note (Signed)
      [facility] Inpatient Consult   03/23/2023  Phillip Young 01/23/1986 [id number]  Follow up:  LLOS 61 days  Patient reviewed for noted disposition to [facility]  Will update [facility] Coordinator that was assigned of patient's planned transition.  Charlesetta Shanks, RN BSN CCM [facility]  [phone] business mobile phone Toll free office [phone]  Fax number: [phone] [email or web address]

## 2023-03-23 NOTE — TOC CM/SW Note (Signed)
Per [facility] - pt has been assigned room # [phone] Accepting MD- Luna Kitchens  Providers are requested to call report to Accepting provider at [phone] sent   Bedside RN can also call report to [phone]

## 2023-03-23 NOTE — Progress Notes (Signed)
Pt. Refused Hemodialysis. Per Bedside RN. Dialysis RN try to talk with pt. About coming to HD, offer pt. Food to eat in HD and given pt instructions if come to HD that he will be off early. Pt, still insist that he is not coming. MD Malen Gauze Notified.

## 2023-03-23 NOTE — Progress Notes (Addendum)
[facility] Progress Note   Subjective:   Seen in room - called for HD this AM and he refused - now says he is agreeable today, didn't realize the plan - HD is off shift this week due to having to run him early on Monday for hyperkalemia. Denies CP/dyspnea.  Objective Vitals:   03/23/23 0547 03/23/23 0548 03/23/23 0549 03/23/23 0804  BP:  (!) 139/115 114/73 114/70  Pulse:  92 93   Resp:  20 19   Temp:  98.5 F (36.9 C)  98.4 F (36.9 C)  TempSrc:  Oral  Oral  SpO2:  100% 100%   Weight: (!) 172.7 kg     Height:       Physical Exam General: Obese man, NAD. Nasal O2 in place Heart: RRR; no murmur Lungs: CTA anteriorly, unable to sit up for posterior exam Abdomen: soft Extremities: trace BLE edema Dialysis Access: TDC in R chest; bandage is clean  Additional Objective Labs: Basic Metabolic Panel: Recent Labs  Lab 03/21/23 0841 03/21/23 1522 03/22/23 0650  NA 125* 125* 130*  K 6.4* 6.7* 4.4  CL 87* 86* 90*  CO2 18* 21* 25  GLUCOSE 95 96 101*  BUN 73* 76* 36*  CREATININE 8.82* 9.19* 5.03*  CALCIUM 10.5* 10.6* 9.7  PHOS 5.6* 6.2* 3.4   Liver Function Tests: Recent Labs  Lab 03/21/23 0841 03/21/23 1522 03/22/23 0650  ALBUMIN 3.2* 3.1* 3.4*   CBC: Recent Labs  Lab 03/18/23 0550 03/19/23 0506 03/20/23 0643 03/21/23 0841 03/22/23 0600  WBC 7.2 9.1 8.1 8.9 8.1  HGB 8.1* 9.1* 8.9* 8.4* 9.1*  HCT 25.8* 29.1* 27.9* 26.3* 28.3*  MCV 103.2* 103.2* 104.1* 101.2* 102.9*  PLT 248 264 277 307 336   Medications:  sodium chloride 10 mL/hr at 02/21/23 1444   albumin human     anticoagulant sodium citrate      (feeding supplement) PROSource Plus  30 mL Oral BID BM   apixaban  5 mg Oral BID   Chlorhexidine Gluconate Cloth  6 each Topical Q0600   cholecalciferol  1,000 Units Oral Daily   darbepoetin (ARANESP) injection - DIALYSIS  200 mcg Subcutaneous Q Sat-1800   digoxin  0.125 mg Oral Once per day on Monday Wednesday Friday   famotidine  10 mg  Oral Daily   gabapentin  100 mg Oral TID   lidocaine  1 patch Transdermal Q24H   melatonin  5 mg Oral QHS   midodrine  30 mg Oral TID WC   multivitamin  1 tablet Oral QHS   mouth rinse  15 mL Mouth Rinse 4 times per day   pneumococcal 20-valent conjugate vaccine  0.5 mL Intramuscular Tomorrow-1000   senna-docusate  1 tablet Oral QHS   sevelamer carbonate  1,600 mg Oral TID WC   sodium zirconium cyclosilicate  10 g Oral Q M,W,F,Sa-1800    Dialysis Orders: [facility]  on TTS schedule  4.25hr, EDW 180kg  425/1.5  TDC, no heparin (HIT +), lock with Na Citrate   Problem/Plan: Acute on chronic hypotension: Able to wean off pressors on 8/3, on high dose midodrine 30mg  TID. Acute on chronic resp failure: Last extubated 7/20, has intermittently required BiPAP, now on 3-6L/min O2. A-fib with RVR: Refractory to DCCV (3 attempts) and amiodarone. Persistent A-fib/flutter - HR > 100. On Eliquis. Acute on chronic HFrEF: EF 20-25%. Per cardiology, not a candidate for nodal ablation, ICD, or pacemaker. Pneumococcal bacteremia/Enterobacter cloacae bacteremia. Admitted in septic shock, complex course. Last Blood  cx + on 8/13 - plan was for line holiday but felt risky due to his resp status - instead just exchanged over wire on 8/14. Now s/p abx course. ESRD: Usual TTS schedule. S/p CRRT 6/29-8/7. Gets extra HD at times. HD done 8/26 (early) due to hyperkalemia. Plan is HD today (Wed) then move back to TTS schedule this week. Volume: Remains volume up with hyponatremia. Trying to keep him as low as possible. Reiterated fluid restrictions.  Needs to stay on full HD Tx Anemia of ESRD: Hgb 9.1 - continue Aranesp q Sat (max). Secondary HPTH: CorrCa high, VDRA on hold. Phos better - continue sevelamer. Nutrition: Alb 3.4, continue supplements (changed Nepro to pro-source to limit volume). Decubitus ulcerations: Occ restlessness while on HD. Prognosis: Poor. Considering [facility].   Ozzie Hoyle,  PA-C 03/23/2023, 10:01 AM  [facility]   Seen and examined independently this AM.  Agree with note and exam as documented above by physician extender and as noted here.  After I saw him this morning and had discussed recs with him, he refused HD today.  Later amenable to do HD however staff had started on other patients' treatments at that time.  Discussed recommendations for low K diet.  He asks if ok to have ketchup and we discussed had potassium.  His urgent HD ordered for Monday was delayed until early AM Tuesday.  He liked dialyzing overnight better.  He asks if that is possible and we discussed that treatments are during the day when staff are available - overnight HD is for emergencies.    General adult male in bed in no acute distress, morbidly obese body habitus HEENT normocephalic atraumatic extraocular movements intact sclera anicteric Neck supple trachea midline Lungs clear to auscultation bilaterally normal work of breathing at rest on supplemental oxygen 4 liters Heart S1S2 no rub  Abdomen soft nontender distended/obese habitus Extremities no pitting edema  Psych normal mood and affect RIJ tunn catheter  Hyperkalemia - HD today if staffing permits and then per TTS schedule as below.    ESRD - HD today if staffing permits and then per TTS schedule as below   Chronic hypotension - on midodrine   He is going to rehab and per team has a bed at [facility]   Estanislado Emms, MD 03/23/2023  2:54 PM

## 2023-03-23 NOTE — TOC Transition Note (Addendum)
Transition of Care (TOC) - CM/SW Discharge Note Donn Pierini RN, BSN Transitions of Care Unit 4E- RN Case Manager See Treatment Team for direct phone #   Patient Details  Name: Phillip Young MRN: [id number] Date of Birth: Feb 21, 1986  Transition of Care [facility]) CM/SW Contact:  Darrold Span, RN Phone Number: 03/23/2023, 3:48 PM   Clinical Narrative:    CM has been notified by [facility] liaison that pt has bed available today for transition to [facility]- Per liaison pt has insurance auth.   CM spoke with pt yesterday regarding his disability- and per pt he has been approved and already has received a check- pt voiced he was notified last week of approval however has not received anything in the mail yet regarding ID card or disability paperwork.   Per [facility] liaison bed will be available later this afternoon- CM will await bed # and further instructions from [facility] prior to transition.  Attending team has been notified and pt has been cleared for discharge to [facility] later today- d/c order to be placed per MD.   Update- 1615- [facility] Liaison has confirmed pt has been assigned to bed # 5731 Accepting MD- Luna Kitchens- provider requested report be called by d/c providers to [phone]  Bedside RN may also call report to [phone]      Final next level of care: Long Term Nursing Home Barriers to Discharge: Barriers Resolved   Patient Goals and CMS Choice  To get better- walk again    Discharge Placement    [facility]                     Discharge Plan and Services Additional resources added to the After Visit Summary for     Discharge Planning Services: CM Consult Post Acute Care Choice: Long Term Acute Care (LTAC)          DME Arranged: N/A DME Agency: NA                  Social Determinants of Health (SDOH) Interventions SDOH Screenings   Food Insecurity: No Food Insecurity (01/22/2023)  Housing: Low Risk  (01/22/2023)   Transportation Needs: No Transportation Needs (01/22/2023)  Utilities: Not At Risk (01/22/2023)  Alcohol Screen: Low Risk  (09/21/2022)  Depression (PHQ2-9): High Risk (09/21/2022)  Financial Resource Strain: High Risk (09/21/2022)  Physical Activity: Inactive (09/21/2022)  Social Connections: Unknown (09/21/2022)  Stress: No Stress Concern Present (09/21/2022)  Tobacco Use: Medium Risk (01/21/2023)     Readmission Risk Interventions    03/23/2023    3:48 PM 06/09/2022   12:19 PM 05/12/2021   10:35 AM  Readmission Risk Prevention Plan  Transportation Screening Complete Complete Complete  Medication Review Oceanographer) Complete Complete Complete  PCP or Specialist appointment within 3-5 days of discharge -- Complete Complete  HRI or Home Care Consult Complete Complete Complete  SW Recovery Care/Counseling Consult Complete Complete Complete  Palliative Care Screening Not Applicable Not Applicable Complete  Skilled Nursing Facility Not Applicable Not Applicable Complete

## 2023-03-23 NOTE — Progress Notes (Signed)
Occupational Therapy Treatment Patient Details Name: Phillip Young MRN: [id number] DOB: 04-Sep-1985 Today's Date: 03/23/2023   History of present illness Pt is a 37 y/o M presenting to ED on 6/28 with fever, joint pain, and SOB after missing HD. Admitted for acute hypoxic respiratory failure. Intubated and started on CRRT, vasopressor support on 6/30. Extubated 7/20. Underwent DCCV 7/5, 7/6, and 7/7 without success. Pt off CRRT on 8/7.  PMH includes ESRD, NICM, refractory A fib, HFrEF   OT comments  Pt in bed upon therapy arrival and agreeable to participate in OT treatment session. Session with focus on activity tolerance, endurance, upper body strength and core strength while completing bed mobility and sitting on EOB. Pt continues to require increased physical assist for all mobility d/t decreased strength and activity tolerance. Requires additional education on the purpose of activities and the reason to attempt a task even though it is difficult throughout session. Pt continues to be appropriate for post acute therapy services to continue working on mentioned deficits.       If plan is discharge home, recommend the following:  Two people to help with walking and/or transfers;Two people to help with bathing/dressing/bathroom;Assistance with cooking/housework;Assistance with feeding;Direct supervision/assist for medications management;Direct supervision/assist for financial management;Help with stairs or ramp for entrance;Assist for transportation   Equipment Recommendations  Hospital bed;Wheelchair cushion (measurements OT);Wheelchair (measurements OT)       Precautions / Restrictions Precautions Precautions: Fall Precaution Comments: wounds on hip/back/buttocks Restrictions Weight Bearing Restrictions: No       Mobility Bed Mobility Overal bed mobility: Needs Assistance Bed Mobility: Rolling, Supine to Sit, Sit to Supine Rolling: Min assist, Used rails   Supine to sit: Max assist,  +2 for physical assistance, HOB elevated, Used rails Sit to supine: Max assist, HOB elevated, Used rails   General bed mobility comments: Total A X2 to boost in bed in trendelenburg. VC to utilize hand rails and bring hips forward when rolling. Pt requested a sheet to placed on bed to decrease his back itching. Increased assistance required to complete supine to sit versus sit to supine when only assist to bring BLE up onto bed needed.    Transfers Overall transfer level:  (Not completed this session)        Balance Overall balance assessment: Needs assistance Sitting-balance support: Single extremity supported, Feet unsupported Sitting balance-Leahy Scale: Good Sitting balance - Comments: Able to tolerate sitting on EOB for ~15 minutes     ADL either performed or assessed with clinical judgement   ADL   Eating/Feeding: Bed level;Independent (required only set up of tray table although able to set up food himself.)          Extremity/Trunk Assessment Upper Extremity Assessment RUE Deficits / Details: Pt able to tolerate self ROM for shoulder flexion to ~90 degrees. Able to demonstrate active shoulde er when supine in bed. Functional elbow, wrist, and hand A/ROM and strength.             Cognition Arousal: Alert Behavior During Therapy: WFL for tasks assessed/performed Overall Cognitive Status: Within Functional Limits for tasks assessed Area of Impairment: Safety/judgement, Following commands, Awareness, Problem solving        Following Commands: Follows one step commands inconsistently (requires directions repeated due to decreased attention to task) Safety/Judgement: Decreased awareness of safety, Decreased awareness of deficits   Problem Solving: Requires verbal cues, Requires tactile cues General Comments: Pt motivated although prefers to do things his way and required education and increased encouragement  to attempt certain techniques during bed mobility and sitting  EOB              General Comments VSS. Pt requested tape to be placed on mediplex foam dressing on lateral side of left leg.    Pertinent Vitals/ Pain       Pain Assessment Pain Assessment: Faces Faces Pain Scale: Hurts even more Pain Location: buttocks, B feet/ankles when touched Pain Descriptors / Indicators: Guarding, Grimacing, Discomfort Pain Intervention(s): Limited activity within patient's tolerance, Monitored during session, Repositioned         Frequency  Min 1X/week        Progress Toward Goals  OT Goals(current goals can now be found in the care plan section)  Progress towards OT goals: Progressing toward goals            AM-PAC OT "6 Clicks" Daily Activity     Outcome Measure   Help from another person eating meals?: None Help from another person taking care of personal grooming?: A Little Help from another person toileting, which includes using toliet, bedpan, or urinal?: Total Help from another person bathing (including washing, rinsing, drying)?: Total Help from another person to put on and taking off regular upper body clothing?: A Lot Help from another person to put on and taking off regular lower body clothing?: Total 6 Click Score: 12    End of Session Equipment Utilized During Treatment: Oxygen  OT Visit Diagnosis: Unsteadiness on feet (R26.81);Other abnormalities of gait and mobility (R26.89);Muscle weakness (generalized) (M62.81);Other symptoms and signs involving cognitive function   Activity Tolerance Patient tolerated treatment well   Patient Left in bed;with call bell/phone within reach           Time: 0907-0943 OT Time Calculation (min): 36 min  Charges: OT General Charges $OT Visit: 1 Visit OT Treatments $Therapeutic Activity: 23-37 mins  Limmie Patricia, OTR/L,CBIS  Supplemental OT - [facility] and [facility] Secure Chat Preferred    Gwenette Wellons, Charisse March 03/23/2023, 10:36 AM

## 2023-03-23 NOTE — Progress Notes (Signed)
This chaplain is present with the Pt. and Pt. mother-Lillian for F/U on creating/updating the Pt. Advance Directive, HCPOA only. The Pt. did not complete a Living Will.  **This chart notes reflects the changes the Pt. and [name] made to the previous AD:  The Pt. chooses [place] as his HCPOA. The Pt. second choice if this person is unwilling or unable to serve to serve as health care agent is Mina Marble.   The Pt. added the following special instructions to his HCPOA: -"Pt chooses not to be admitted to 2H and 2C." -"No blood transfusions without communication with HCPOA." -"No intubation without communication with HCPOA."  This chaplain gave the Pt. the original AD along with two copies. The chaplain scanned the Pt. AD into the Pt. EMR.  This chaplain is available for F/U spiritual care as needed.  Chaplain Stephanie Acre [phone]

## 2023-03-23 NOTE — Progress Notes (Signed)
Pt being D/C, VSS, No IV to remove, HD cath in place. VSS  Balinda Quails, RN 03/23/2023

## 2023-03-23 NOTE — Discharge Summary (Signed)
Name: Phillip Young MRN: [id number] DOB: 1985-11-21 37 y.o. PCP: Marrianne Mood, MD  Date of Admission: 01/21/2023  5:32 PM Date of Discharge: No discharge date for patient encounter. Attending Physician: Gust Rung, DO  Discharge Diagnosis: 1. Principal Problem:   Acute hypoxic respiratory failure ([name]) Active Problems:   Morbid obesity ([name])   Chronic systolic CHF (congestive heart failure) ([name])   Cardiogenic shock ([name])   ESRD (end stage renal disease) ([name])   ESRD (end stage renal disease) on dialysis ([name])   ESRD on dialysis ([name])   Hypervolemia   Sepsis with acute organ dysfunction without septic shock ([name])   Congestive heart failure ([name])   Atrial fibrillation ([name])   Pressure injury of skin   Central line infection   Hyperkalemia  Discharge Medications: Allergies as of 03/23/2023       Reactions   Amiodarone Other (See Comments)   Suspicion for amiodarone lung/hepatotoxicity   Coreg [carvedilol] Shortness Of Breath, Diarrhea   Wheezing    Heparin Other (See Comments)   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, SRA positive   Metoprolol Other (See Comments)   near syncope   Amoxicillin Other (See Comments)   Was hospitalized    Other Swelling, Other (See Comments)   Steroids Fluid seeping out of legs         Medication List     STOP taking these medications    clindamycin 1 % gel Commonly known as: Clindagel   fluticasone furoate-vilanterol 100-25 MCG/ACT Aepb Commonly known as: BREO ELLIPTA   metolazone 10 MG tablet Commonly known as: ZAROXOLYN   torsemide 100 MG tablet Commonly known as: DEMADEX       TAKE these medications    (feeding supplement) PROSource Plus liquid Take 30 mLs by mouth 2 (two) times daily between meals.   acetaminophen 325 MG tablet Commonly known as: TYLENOL Take 2 tablets (650 mg total) by mouth every 6 (six) hours as needed for fever (give only if temp is >101.4).   acetaminophen 160 MG/5ML  solution Commonly known as: TYLENOL Take 20.3 mLs (650 mg total) by mouth every 6 (six) hours as needed for mild pain.   albumin human 25 % bottle Inject 100 mLs (25 g total) into the vein Every Tuesday,Thursday,and Saturday with dialysis. Start taking on: March 24, 2023   anticoagulant sodium citrate 4 GM/100ML Soln 5 mLs by Intracatheter route once for 1 dose.   apixaban 5 MG Tabs tablet Commonly known as: Eliquis Take 1 tablet (5 mg total) by mouth 2 (two) times daily.   bisacodyl 10 MG suppository Commonly known as: DULCOLAX Place 1 suppository (10 mg total) rectally daily as needed for moderate constipation.   calamine lotion Apply 1 Application topically 2 (two) times daily.   camphor-menthol lotion Commonly known as: SARNA Apply topically as needed for itching.   Chlorhexidine Gluconate Cloth 2 % Pads Apply 6 each topically daily at 6 (six) AM.   Darbepoetin Alfa 200 MCG/0.4ML Sosy injection Commonly known as: ARANESP Inject 0.4 mLs (200 mcg total) into the skin every Saturday at 6 PM. Start taking on: March 26, 2023   digoxin 0.125 MG tablet Commonly known as: LANOXIN Take 1 tablet (0.125 mg total) by mouth 3 (three) times a week.   docusate sodium 100 MG capsule Commonly known as: COLACE Take 1 capsule (100 mg total) by mouth 2 (two) times daily as needed for mild constipation.   famotidine 10 MG tablet Commonly known as: PEPCID Take 1 tablet (  10 mg total) by mouth daily.   gabapentin 100 MG capsule Commonly known as: NEURONTIN Take 1 capsule (100 mg total) by mouth 3 (three) times daily. What changed:  how much to take when to take this   guaiFENesin-dextromethorphan 100-10 MG/5ML syrup Commonly known as: ROBITUSSIN DM Take 5 mLs by mouth every 4 (four) hours as needed for cough (chest congestion).   haloperidol lactate 5 MG/ML injection Commonly known as: HALDOL Inject 0.2 mLs (1 mg total) into the vein every 12 (twelve) hours as needed.    hydrocortisone cream 1 % Apply 1 Application topically 3 (three) times daily as needed for itching (minor skin irritation).   hydrOXYzine 10 MG tablet Commonly known as: ATARAX Take 1 tablet (10 mg total) by mouth daily as needed for itching. What changed:  medication strength how much to take when to take this reasons to take this   lanthanum 1000 MG chewable tablet Commonly known as: FOSRENOL Chew 2,000 mg by mouth 3 (three) times daily with meals.   lidocaine 4 % cream Commonly known as: LMX Apply topically 4 (four) times daily as needed (for pain or burning).   lidocaine 5 % Commonly known as: LIDODERM Place 1 patch onto the skin daily. Remove & Discard patch within 12 hours or as directed by MD   magnesium oxide 400 (240 Mg) MG tablet Commonly known as: MAG-OX Take 400 mg by mouth 2 (two) times daily.   melatonin 5 MG Tabs Take 1 tablet (5 mg total) by mouth at bedtime.   midodrine 10 MG tablet Commonly known as: PROAMATINE Take 3 tablets (30 mg total) by mouth 3 (three) times daily with meals. What changed:  medication strength See the new instructions.   mouth rinse Liqd solution 15 mLs by Mouth Rinse route in the morning, at noon, in the evening, and at bedtime.   multivitamin Tabs tablet Take 1 tablet by mouth at bedtime.   oxyCODONE 15 MG immediate release tablet Commonly known as: ROXICODONE Take 1 tablet (15 mg total) by mouth every 4 (four) hours as needed for severe pain. What changed:  medication strength how much to take when to take this reasons to take this   polyethylene glycol 17 g packet Commonly known as: MIRALAX / GLYCOLAX Take 17 g by mouth daily as needed for moderate constipation or severe constipation.   polyvinyl alcohol 1.4 % ophthalmic solution Commonly known as: LIQUIFILM TEARS Place 1 drop into both eyes as needed for dry eyes.   senna-docusate 8.6-50 MG tablet Commonly known as: Senokot-S Take 1 tablet by mouth at  bedtime.   sevelamer carbonate 800 MG tablet Commonly known as: RENVELA Take 1,600 mg by mouth 3 (three) times daily with meals.   sodium zirconium cyclosilicate 10 g Pack packet Commonly known as: LOKELMA Take 10 g by mouth every Monday, Wednesday, Friday, and Saturday at 6 PM.   surgical lubricant gel Apply topically as needed (to both eyes).   vitamin D3 25 MCG tablet Commonly known as: CHOLECALCIFEROL Take 1 tablet (1,000 Units total) by mouth daily.   Wegovy 0.25 MG/0.5ML Soaj Generic drug: Semaglutide-Weight Management Inject 0.25 mg into the skin once a week.               Discharge Care Instructions  (From admission, onward)           Start     Ordered   03/23/23 0000  Discharge wound care:       Comments: 1. Cut  3 narrow strips of Aquacel [place] [phone]) and insert into deeper areas of left thigh wound Q day, using swab to fill, then apply another piece of Aquacel over the top and cover with foam dressing.  Change foam dressing Q 3 days or PRN soiling.  2. Apply Aquacel to bilat buttock wounds Q day, then cover with foam dressings.  Change foam dressings and aquacel daily and PRN soiling. Cleanse soiled areas with Vashe (LAWSON # [id number]) soaked gauze each time before applying dressings.   03/23/23 1138            Disposition and follow-up:   Mr.Edgardo Meharg was discharged from [facility] in Stable condition.  He will be discharged to [facility]  This patient was admitted to [facility] on 6/28 with hypoxic respiratory failure and AFRVR influenced by septic pneumonia, HFrEF, nonadherence to dialysis, and excessive fluid intake. Cardiology was consulted for his Afib with limited treatment options in setting of amiodarone lung toxicity. He required ICU care with intubation on the second day of admission with worsening mixed septic and cardiogenic shock. Later blood cultures revealed pneumococcal  bacteremia treated appropriately. Despite multiple failed cardioversions he converted to sinus rhythm and sepsis improved. He was extubated on 7/20. He improved sufficiently to leave the ICU on 8/9. Afterwards, he remained tachycardic with the only available treatment digoxin rate control but he slowly returned to appropriate rate. Additionally, he had intermittent electrolyte derangements influenced by missing dialysis. He tolerated dialysis poorly at times due to pain from gluteal/thigh ulcers as well as hypotension. Pain control improved with oxycodone 15mg  and dialysis became more regular. He remained medically stable for the last several days of hospitalization as he awaited a bed at [facility] facility.  Hospital Course by problem list:  #Acute on chronic HFrEF with pulmonary edema #Paroxysmal Afib/flutter with RVR #Nonischemic cardiomyopathy EF less than 20% Patient has long history of heart failure with ejection fraction less than 20%. He initially presented volume overloaded with severe pulmonary edema. He is intolerant of many rate controlling medications, due to allergy, toxicity, or failing them in the past. Many unsuccessful attempted cardioversions during stay. Cardiology service was consulted and recommended digoxin for rate control. He has remained in and out of Afib while hospitalized with heart rates ranging up to the 150s, with improvement towards normal as he approached discharge and otherwise hemodynamically stable.    #Acute on chronic hypoxemic/hypercapnic respiratory failure #Acute metabolic encephalopathy in setting of hypercapnia Patient presented acutely volume overloaded with severe pulmonary edema, altered mental status, and respiratory failure requiring intubation and stay in the ICU for over a month. He was able to be extubated and transitioned to the floor where he was maintained on high levels of supplemental oxygen by nasal cannula. Discharged stable requiring 4L [place].   #ESRD  on HD T/Th/S #Recurrent metabolic acidosis, improving Patient with history of ESRD, requiring emergent HD on 6/29 because of fluid overload, started CRRT on the same day. Patient was eventually transitioned off CRRT and has been able to resume HD on T/Th/S. Patient has had multiple problems with his tunneled dialysis catheter related to clotting and fibrin sheathes. IR replaced TDC on 8/14. A line holiday was requested due to new bacteremia, though patient could not tolerate interrupting dialysis schedule. Patient has required haldol during dialysis sessions due to agitation and pain from skin breakdown. He also had hypotension that was addressed with midodrine. Agitation improved somewhat but he would  still cut dialysis short at times due to pain.    #Bacteremia Blood cultures on 8/15 positive for enterobacter cloacae. WBC count resolved to normal limits. Unable to tolerate line holiday given needs for dialysis. Repeat blood cultures showing no growth. Treated with cefepime starting 8/14, transitioned to levaquin 8/20, abx stopped 8/23   #Mixed Septic and Cardiogenic Shock Patient initially admitted for pneumonia with hemodynamic instability, severe leukocytosis, elevated lactate, CXR with vascular crowding. Patient was intubated and treated in the ICU with pressors and broad spectrum antibiotics. Blood cultures were positive for pneumococcal bacteremia. Course was complicated by cardiogenic shock in setting of history of recurrent Afib with RVR. Multiple failed attempts to cardiovert. Sepsis and cardiogenic shock responded appropriately to antibiotics, pressors and CRRT and patient was extubated on 7/20. He was able to be transferred to the floor on 8/9.    #Hypervolemic hyponatremia #Hyperphosphatemia #Hyperkalemia #Hypocalcemia Patient developed multiple electrolyte derangements in setting of ESRD with intermittent dialysis completion. Electrolytes were monitored with daily BMP. Hyperkalemia was a  recurrent problem with multiple doses of lokelma, insulin/D50, albuterol, calcium gluconate given for cardiac stability.    #Anemia of chronic disease Patient anemia at baseline due to ESRD and multiple comorbidities. Hemoglobin monitored with daily labs.   #Skin breakdown Perineal and sacral skin breakdown noted, often making dialysis sessions difficult for patient to tolerate. Additionally L lateral thigh wound progressed to depth of 2cm. Wound care consulted, local woundcare performed. Most effective pain control achieved with oxy 15 q4hrs prn.  #Chronic pain #Neuropathic foot pain Chronic neuropathy in b/l feet described as "icy-fire." Limited treatment options in this patient with ESRD. Best regimen gabapentin 100 TID with heating pads for neuropathic foot pain.  Discharge Exam:   BP 110/75 (BP Location: Left Arm)   Pulse 93   Temp 98.2 F (36.8 C) (Oral)   Resp 19   Ht 6' (1.829 m)   Wt (!) 172.7 kg   SpO2 96%   BMI 51.64 kg/m  Physical Exam Constitutional:      General: He is not in acute distress.    Appearance: Normal appearance. He is obese. He is not ill-appearing.  HENT:     Head: Normocephalic and atraumatic.  Cardiovascular:     Rate and Rhythm: Normal rate and regular rhythm.     Pulses: Normal pulses.     Heart sounds: Normal heart sounds.  Pulmonary:     Effort: Pulmonary effort is normal. No respiratory distress.     Breath sounds: Normal breath sounds.  Abdominal:     General: Abdomen is flat.     Palpations: Abdomen is soft.     Tenderness: There is no abdominal tenderness.  Musculoskeletal:     Right lower leg: No edema.     Left lower leg: No edema.  Skin:    General: Skin is warm and dry.     Capillary Refill: Capillary refill takes less than 2 seconds.     Findings: Lesion present.  Neurological:     General: No focal deficit present.     Mental Status: He is alert and oriented to person, place, and time.   Pertinent Labs, Studies, and  Procedures:  TTS hemodialysis. ECHO 7/2 with EF 20-25%.  Discharge Instructions: Discharge Instructions     Call MD for:  difficulty breathing, headache or visual disturbances   Complete by: As directed    Call MD for:  extreme fatigue   Complete by: As directed    Call MD  for:  persistant dizziness or light-headedness   Complete by: As directed    Call MD for:  persistant nausea and vomiting   Complete by: As directed    Call MD for:  redness, tenderness, or signs of infection (pain, swelling, redness, odor or green/yellow discharge around incision site)   Complete by: As directed    Call MD for:  severe uncontrolled pain   Complete by: As directed    Call MD for:  temperature >100.4   Complete by: As directed    Discharge wound care:   Complete by: As directed    1. Cut 3 narrow strips of Aquacel [place] [phone]) and insert into deeper areas of left thigh wound Q day, using swab to fill, then apply another piece of Aquacel over the top and cover with foam dressing.  Change foam dressing Q 3 days or PRN soiling.  2. Apply Aquacel to bilat buttock wounds Q day, then cover with foam dressings.  Change foam dressings and aquacel daily and PRN soiling. Cleanse soiled areas with Vashe (LAWSON # [id number]) soaked gauze each time before applying dressings.       SignedKatheran James, DO 03/23/2023, 11:40 AM   Pager: [phone]

## 2023-03-24 DIAGNOSIS — J9601 Acute respiratory failure with hypoxia: Secondary | ICD-10-CM

## 2023-03-24 DIAGNOSIS — D638 Anemia in other chronic diseases classified elsewhere: Secondary | ICD-10-CM | POA: Diagnosis not present

## 2023-03-24 DIAGNOSIS — N186 End stage renal disease: Secondary | ICD-10-CM

## 2023-03-24 DIAGNOSIS — E876 Hypokalemia: Secondary | ICD-10-CM | POA: Diagnosis not present

## 2023-03-24 DIAGNOSIS — I482 Chronic atrial fibrillation, unspecified: Secondary | ICD-10-CM

## 2023-03-24 DIAGNOSIS — R6520 Severe sepsis without septic shock: Secondary | ICD-10-CM

## 2023-03-24 LAB — COMPREHENSIVE METABOLIC PANEL
ALT: 11 U/L (ref 0–44)
AST: 22 U/L (ref 15–41)
Albumin: 3.1 g/dL — ABNORMAL LOW (ref 3.5–5.0)
Alkaline Phosphatase: 205 U/L — ABNORMAL HIGH (ref 38–126)
Anion gap: 19 — ABNORMAL HIGH (ref 5–15)
BUN: 66 mg/dL — ABNORMAL HIGH (ref 6–20)
CO2: 23 mmol/L (ref 22–32)
Calcium: 10.6 mg/dL — ABNORMAL HIGH (ref 8.9–10.3)
Chloride: 90 mmol/L — ABNORMAL LOW (ref 98–111)
Creatinine, Ser: 9.03 mg/dL — ABNORMAL HIGH (ref 0.61–1.24)
GFR, Estimated: 7 mL/min — ABNORMAL LOW (ref >60)
Glucose, Bld: 91 mg/dL (ref 70–99)
Potassium: 5.7 mmol/L — ABNORMAL HIGH (ref 3.5–5.1)
Sodium: 132 mmol/L — ABNORMAL LOW (ref 135–145)
Total Bilirubin: 0.7 mg/dL (ref 0.3–1.2)
Total Protein: 6.9 g/dL (ref 6.5–8.1)

## 2023-03-24 LAB — CBC
HCT: 29.2 % — ABNORMAL LOW (ref 39.0–52.0)
Hemoglobin: 9.1 g/dL — ABNORMAL LOW (ref 13.0–17.0)
MCH: 32.3 pg (ref 26.0–34.0)
MCHC: 31.2 g/dL (ref 30.0–36.0)
MCV: 103.5 fL — ABNORMAL HIGH (ref 80.0–100.0)
Platelets: 303 10*3/uL (ref 150–400)
RBC: 2.82 MIL/uL — ABNORMAL LOW (ref 4.22–5.81)
RDW: 18.4 % — ABNORMAL HIGH (ref 11.5–15.5)
WBC: 5.9 10*3/uL (ref 4.0–10.5)
nRBC: 0 % (ref 0.0–0.2)

## 2023-03-25 DIAGNOSIS — N186 End stage renal disease: Secondary | ICD-10-CM

## 2023-03-25 DIAGNOSIS — D638 Anemia in other chronic diseases classified elsewhere: Secondary | ICD-10-CM | POA: Diagnosis not present

## 2023-03-25 DIAGNOSIS — J9601 Acute respiratory failure with hypoxia: Secondary | ICD-10-CM

## 2023-03-25 DIAGNOSIS — I959 Hypotension, unspecified: Secondary | ICD-10-CM | POA: Diagnosis not present

## 2023-03-25 DIAGNOSIS — R6520 Severe sepsis without septic shock: Secondary | ICD-10-CM

## 2023-03-25 DIAGNOSIS — E876 Hypokalemia: Secondary | ICD-10-CM | POA: Diagnosis not present

## 2023-03-25 DIAGNOSIS — N2581 Secondary hyperparathyroidism of renal origin: Secondary | ICD-10-CM | POA: Diagnosis not present

## 2023-03-25 DIAGNOSIS — I482 Chronic atrial fibrillation, unspecified: Secondary | ICD-10-CM

## 2023-03-25 DIAGNOSIS — D631 Anemia in chronic kidney disease: Secondary | ICD-10-CM | POA: Diagnosis not present

## 2023-03-25 LAB — DIGOXIN LEVEL: Digoxin Level: 1.2 ng/mL (ref 0.8–2.0)

## 2023-03-25 LAB — CBC
HCT: 26.8 % — ABNORMAL LOW (ref 39.0–52.0)
Hemoglobin: 8.5 g/dL — ABNORMAL LOW (ref 13.0–17.0)
MCH: 33.6 pg (ref 26.0–34.0)
MCHC: 31.7 g/dL (ref 30.0–36.0)
MCV: 105.9 fL — ABNORMAL HIGH (ref 80.0–100.0)
Platelets: 295 10*3/uL (ref 150–400)
RBC: 2.53 MIL/uL — ABNORMAL LOW (ref 4.22–5.81)
RDW: 18.1 % — ABNORMAL HIGH (ref 11.5–15.5)
WBC: 5.4 10*3/uL (ref 4.0–10.5)
nRBC: 0 % (ref 0.0–0.2)

## 2023-03-25 LAB — COMPREHENSIVE METABOLIC PANEL WITH GFR
ALT: 9 U/L (ref 0–44)
AST: 17 U/L (ref 15–41)
Albumin: 2.8 g/dL — ABNORMAL LOW (ref 3.5–5.0)
Alkaline Phosphatase: 187 U/L — ABNORMAL HIGH (ref 38–126)
Anion gap: 18 — ABNORMAL HIGH (ref 5–15)
BUN: 79 mg/dL — ABNORMAL HIGH (ref 6–20)
CO2: 20 mmol/L — ABNORMAL LOW (ref 22–32)
Calcium: 10.2 mg/dL (ref 8.9–10.3)
Chloride: 92 mmol/L — ABNORMAL LOW (ref 98–111)
Creatinine, Ser: 10.72 mg/dL — ABNORMAL HIGH (ref 0.61–1.24)
GFR, Estimated: 6 mL/min — ABNORMAL LOW
Glucose, Bld: 100 mg/dL — ABNORMAL HIGH (ref 70–99)
Potassium: 5 mmol/L (ref 3.5–5.1)
Sodium: 130 mmol/L — ABNORMAL LOW (ref 135–145)
Total Bilirubin: 0.3 mg/dL (ref 0.3–1.2)
Total Protein: 6.5 g/dL (ref 6.5–8.1)

## 2023-03-25 LAB — TSH: TSH: 11.683 u[IU]/mL — ABNORMAL HIGH (ref 0.350–4.500)

## 2023-03-25 LAB — T4, FREE: Free T4: 0.75 ng/dL (ref 0.61–1.12)

## 2023-03-26 DIAGNOSIS — R6520 Severe sepsis without septic shock: Secondary | ICD-10-CM

## 2023-03-26 DIAGNOSIS — N186 End stage renal disease: Secondary | ICD-10-CM

## 2023-03-26 DIAGNOSIS — I959 Hypotension, unspecified: Secondary | ICD-10-CM | POA: Diagnosis not present

## 2023-03-26 DIAGNOSIS — D638 Anemia in other chronic diseases classified elsewhere: Secondary | ICD-10-CM | POA: Diagnosis not present

## 2023-03-26 DIAGNOSIS — E876 Hypokalemia: Secondary | ICD-10-CM | POA: Diagnosis not present

## 2023-03-26 DIAGNOSIS — J9601 Acute respiratory failure with hypoxia: Secondary | ICD-10-CM

## 2023-03-26 DIAGNOSIS — N2581 Secondary hyperparathyroidism of renal origin: Secondary | ICD-10-CM | POA: Diagnosis not present

## 2023-03-26 DIAGNOSIS — I482 Chronic atrial fibrillation, unspecified: Secondary | ICD-10-CM

## 2023-03-26 DIAGNOSIS — D631 Anemia in chronic kidney disease: Secondary | ICD-10-CM | POA: Diagnosis not present

## 2023-03-27 DIAGNOSIS — R6520 Severe sepsis without septic shock: Secondary | ICD-10-CM | POA: Diagnosis not present

## 2023-03-27 DIAGNOSIS — I482 Chronic atrial fibrillation, unspecified: Secondary | ICD-10-CM | POA: Diagnosis not present

## 2023-03-27 DIAGNOSIS — D638 Anemia in other chronic diseases classified elsewhere: Secondary | ICD-10-CM | POA: Diagnosis not present

## 2023-03-27 DIAGNOSIS — J9601 Acute respiratory failure with hypoxia: Secondary | ICD-10-CM | POA: Diagnosis not present

## 2023-03-27 DIAGNOSIS — I5023 Acute on chronic systolic (congestive) heart failure: Secondary | ICD-10-CM | POA: Diagnosis not present

## 2023-03-27 DIAGNOSIS — I5082 Biventricular heart failure: Secondary | ICD-10-CM | POA: Diagnosis not present

## 2023-03-27 DIAGNOSIS — E876 Hypokalemia: Secondary | ICD-10-CM | POA: Diagnosis not present

## 2023-03-27 DIAGNOSIS — N186 End stage renal disease: Secondary | ICD-10-CM | POA: Diagnosis not present

## 2023-03-28 DIAGNOSIS — R6520 Severe sepsis without septic shock: Secondary | ICD-10-CM

## 2023-03-28 DIAGNOSIS — N186 End stage renal disease: Secondary | ICD-10-CM

## 2023-03-28 DIAGNOSIS — I959 Hypotension, unspecified: Secondary | ICD-10-CM | POA: Diagnosis not present

## 2023-03-28 DIAGNOSIS — E876 Hypokalemia: Secondary | ICD-10-CM | POA: Diagnosis not present

## 2023-03-28 DIAGNOSIS — D631 Anemia in chronic kidney disease: Secondary | ICD-10-CM | POA: Diagnosis not present

## 2023-03-28 DIAGNOSIS — J9601 Acute respiratory failure with hypoxia: Secondary | ICD-10-CM

## 2023-03-28 DIAGNOSIS — D638 Anemia in other chronic diseases classified elsewhere: Secondary | ICD-10-CM | POA: Diagnosis not present

## 2023-03-28 DIAGNOSIS — I482 Chronic atrial fibrillation, unspecified: Secondary | ICD-10-CM

## 2023-03-28 DIAGNOSIS — N2581 Secondary hyperparathyroidism of renal origin: Secondary | ICD-10-CM | POA: Diagnosis not present

## 2023-03-28 LAB — RENAL FUNCTION PANEL
Albumin: 2.6 g/dL — ABNORMAL LOW (ref 3.5–5.0)
Anion gap: 15 (ref 5–15)
BUN: 66 mg/dL — ABNORMAL HIGH (ref 6–20)
CO2: 21 mmol/L — ABNORMAL LOW (ref 22–32)
Calcium: 9.7 mg/dL (ref 8.9–10.3)
Chloride: 92 mmol/L — ABNORMAL LOW (ref 98–111)
Creatinine, Ser: 8.87 mg/dL — ABNORMAL HIGH (ref 0.61–1.24)
GFR, Estimated: 7 mL/min — ABNORMAL LOW (ref >60)
Glucose, Bld: 96 mg/dL (ref 70–99)
Phosphorus: 6.1 mg/dL — ABNORMAL HIGH (ref 2.5–4.6)
Potassium: 6 mmol/L — ABNORMAL HIGH (ref 3.5–5.1)
Sodium: 128 mmol/L — ABNORMAL LOW (ref 135–145)

## 2023-03-28 LAB — CBC WITH DIFFERENTIAL/PLATELET
Abs Immature Granulocytes: 0.02 10*3/uL (ref 0.00–0.07)
Basophils Absolute: 0.1 10*3/uL (ref 0.0–0.1)
Basophils Relative: 1 %
Eosinophils Absolute: 0.4 10*3/uL (ref 0.0–0.5)
Eosinophils Relative: 7 %
HCT: 25.5 % — ABNORMAL LOW (ref 39.0–52.0)
Hemoglobin: 8.1 g/dL — ABNORMAL LOW (ref 13.0–17.0)
Immature Granulocytes: 0 %
Lymphocytes Relative: 12 %
Lymphs Abs: 0.6 10*3/uL — ABNORMAL LOW (ref 0.7–4.0)
MCH: 33.9 pg (ref 26.0–34.0)
MCHC: 31.8 g/dL (ref 30.0–36.0)
MCV: 106.7 fL — ABNORMAL HIGH (ref 80.0–100.0)
Monocytes Absolute: 0.8 10*3/uL (ref 0.1–1.0)
Monocytes Relative: 15 %
Neutro Abs: 3.6 10*3/uL (ref 1.7–7.7)
Neutrophils Relative %: 65 %
Platelets: 259 10*3/uL (ref 150–400)
RBC: 2.39 MIL/uL — ABNORMAL LOW (ref 4.22–5.81)
RDW: 17.4 % — ABNORMAL HIGH (ref 11.5–15.5)
WBC: 5.4 10*3/uL (ref 4.0–10.5)
nRBC: 0 % (ref 0.0–0.2)

## 2023-03-29 DIAGNOSIS — D638 Anemia in other chronic diseases classified elsewhere: Secondary | ICD-10-CM | POA: Diagnosis not present

## 2023-03-29 DIAGNOSIS — E876 Hypokalemia: Secondary | ICD-10-CM | POA: Diagnosis not present

## 2023-03-29 DIAGNOSIS — N186 End stage renal disease: Secondary | ICD-10-CM | POA: Diagnosis not present

## 2023-03-29 DIAGNOSIS — R6520 Severe sepsis without septic shock: Secondary | ICD-10-CM | POA: Diagnosis not present

## 2023-03-29 DIAGNOSIS — J9601 Acute respiratory failure with hypoxia: Secondary | ICD-10-CM | POA: Diagnosis not present

## 2023-03-29 DIAGNOSIS — I482 Chronic atrial fibrillation, unspecified: Secondary | ICD-10-CM | POA: Diagnosis not present

## 2023-03-29 LAB — POTASSIUM: Potassium: 4.6 mmol/L (ref 3.5–5.1)

## 2023-03-30 DIAGNOSIS — I482 Chronic atrial fibrillation, unspecified: Secondary | ICD-10-CM

## 2023-03-30 DIAGNOSIS — I959 Hypotension, unspecified: Secondary | ICD-10-CM | POA: Diagnosis not present

## 2023-03-30 DIAGNOSIS — N186 End stage renal disease: Secondary | ICD-10-CM

## 2023-03-30 DIAGNOSIS — R6520 Severe sepsis without septic shock: Secondary | ICD-10-CM

## 2023-03-30 DIAGNOSIS — E876 Hypokalemia: Secondary | ICD-10-CM | POA: Diagnosis not present

## 2023-03-30 DIAGNOSIS — D631 Anemia in chronic kidney disease: Secondary | ICD-10-CM | POA: Diagnosis not present

## 2023-03-30 DIAGNOSIS — D638 Anemia in other chronic diseases classified elsewhere: Secondary | ICD-10-CM | POA: Diagnosis not present

## 2023-03-30 DIAGNOSIS — N2581 Secondary hyperparathyroidism of renal origin: Secondary | ICD-10-CM | POA: Diagnosis not present

## 2023-03-30 DIAGNOSIS — J9601 Acute respiratory failure with hypoxia: Secondary | ICD-10-CM

## 2023-03-30 LAB — CBC WITH DIFFERENTIAL/PLATELET
Abs Immature Granulocytes: 0.03 10*3/uL (ref 0.00–0.07)
Basophils Absolute: 0 10*3/uL (ref 0.0–0.1)
Basophils Relative: 1 %
Eosinophils Absolute: 0.3 10*3/uL (ref 0.0–0.5)
Eosinophils Relative: 5 %
HCT: 23.5 % — ABNORMAL LOW (ref 39.0–52.0)
Hemoglobin: 7.2 g/dL — ABNORMAL LOW (ref 13.0–17.0)
Immature Granulocytes: 1 %
Lymphocytes Relative: 10 %
Lymphs Abs: 0.6 10*3/uL — ABNORMAL LOW (ref 0.7–4.0)
MCH: 32.4 pg (ref 26.0–34.0)
MCHC: 30.6 g/dL (ref 30.0–36.0)
MCV: 105.9 fL — ABNORMAL HIGH (ref 80.0–100.0)
Monocytes Absolute: 1.2 10*3/uL — ABNORMAL HIGH (ref 0.1–1.0)
Monocytes Relative: 18 %
Neutro Abs: 4.1 10*3/uL (ref 1.7–7.7)
Neutrophils Relative %: 65 %
Platelets: 255 10*3/uL (ref 150–400)
RBC: 2.22 MIL/uL — ABNORMAL LOW (ref 4.22–5.81)
RDW: 17.4 % — ABNORMAL HIGH (ref 11.5–15.5)
WBC: 6.3 10*3/uL (ref 4.0–10.5)
nRBC: 0 % (ref 0.0–0.2)

## 2023-03-30 LAB — CBC
HCT: 22.9 % — ABNORMAL LOW (ref 39.0–52.0)
Hemoglobin: 7.4 g/dL — ABNORMAL LOW (ref 13.0–17.0)
MCH: 33.9 pg (ref 26.0–34.0)
MCHC: 32.3 g/dL (ref 30.0–36.0)
MCV: 105 fL — ABNORMAL HIGH (ref 80.0–100.0)
Platelets: 257 10*3/uL (ref 150–400)
RBC: 2.18 MIL/uL — ABNORMAL LOW (ref 4.22–5.81)
RDW: 17.1 % — ABNORMAL HIGH (ref 11.5–15.5)
WBC: 6.3 10*3/uL (ref 4.0–10.5)
nRBC: 0 % (ref 0.0–0.2)

## 2023-03-30 LAB — RENAL FUNCTION PANEL
Albumin: 2.5 g/dL — ABNORMAL LOW (ref 3.5–5.0)
Anion gap: 14 (ref 5–15)
BUN: 43 mg/dL — ABNORMAL HIGH (ref 6–20)
CO2: 24 mmol/L (ref 22–32)
Calcium: 9.6 mg/dL (ref 8.9–10.3)
Chloride: 92 mmol/L — ABNORMAL LOW (ref 98–111)
Creatinine, Ser: 6.62 mg/dL — ABNORMAL HIGH (ref 0.61–1.24)
GFR, Estimated: 10 mL/min — ABNORMAL LOW (ref >60)
Glucose, Bld: 82 mg/dL (ref 70–99)
Phosphorus: 5.9 mg/dL — ABNORMAL HIGH (ref 2.5–4.6)
Potassium: 5.1 mmol/L (ref 3.5–5.1)
Sodium: 130 mmol/L — ABNORMAL LOW (ref 135–145)

## 2023-03-31 DIAGNOSIS — D638 Anemia in other chronic diseases classified elsewhere: Secondary | ICD-10-CM | POA: Diagnosis not present

## 2023-03-31 DIAGNOSIS — I482 Chronic atrial fibrillation, unspecified: Secondary | ICD-10-CM | POA: Diagnosis not present

## 2023-03-31 DIAGNOSIS — N186 End stage renal disease: Secondary | ICD-10-CM | POA: Diagnosis not present

## 2023-03-31 DIAGNOSIS — J9601 Acute respiratory failure with hypoxia: Secondary | ICD-10-CM | POA: Diagnosis not present

## 2023-03-31 DIAGNOSIS — E876 Hypokalemia: Secondary | ICD-10-CM | POA: Diagnosis not present

## 2023-03-31 DIAGNOSIS — R6520 Severe sepsis without septic shock: Secondary | ICD-10-CM | POA: Diagnosis not present

## 2023-04-01 DIAGNOSIS — N186 End stage renal disease: Secondary | ICD-10-CM

## 2023-04-01 DIAGNOSIS — I482 Chronic atrial fibrillation, unspecified: Secondary | ICD-10-CM

## 2023-04-01 DIAGNOSIS — R6520 Severe sepsis without septic shock: Secondary | ICD-10-CM

## 2023-04-01 DIAGNOSIS — N2581 Secondary hyperparathyroidism of renal origin: Secondary | ICD-10-CM | POA: Diagnosis not present

## 2023-04-01 DIAGNOSIS — I959 Hypotension, unspecified: Secondary | ICD-10-CM | POA: Diagnosis not present

## 2023-04-01 DIAGNOSIS — J9601 Acute respiratory failure with hypoxia: Secondary | ICD-10-CM

## 2023-04-01 DIAGNOSIS — D638 Anemia in other chronic diseases classified elsewhere: Secondary | ICD-10-CM | POA: Diagnosis not present

## 2023-04-01 DIAGNOSIS — E876 Hypokalemia: Secondary | ICD-10-CM | POA: Diagnosis not present

## 2023-04-01 DIAGNOSIS — D631 Anemia in chronic kidney disease: Secondary | ICD-10-CM | POA: Diagnosis not present

## 2023-04-01 LAB — CBC WITH DIFFERENTIAL/PLATELET
Abs Immature Granulocytes: 0.03 10*3/uL (ref 0.00–0.07)
Basophils Absolute: 0 10*3/uL (ref 0.0–0.1)
Basophils Relative: 1 %
Eosinophils Absolute: 0.4 10*3/uL (ref 0.0–0.5)
Eosinophils Relative: 8 %
HCT: 24.5 % — ABNORMAL LOW (ref 39.0–52.0)
Hemoglobin: 7.5 g/dL — ABNORMAL LOW (ref 13.0–17.0)
Immature Granulocytes: 1 %
Lymphocytes Relative: 17 %
Lymphs Abs: 0.9 10*3/uL (ref 0.7–4.0)
MCH: 31.9 pg (ref 26.0–34.0)
MCHC: 30.6 g/dL (ref 30.0–36.0)
MCV: 104.3 fL — ABNORMAL HIGH (ref 80.0–100.0)
Monocytes Absolute: 0.6 10*3/uL (ref 0.1–1.0)
Monocytes Relative: 12 %
Neutro Abs: 3.3 10*3/uL (ref 1.7–7.7)
Neutrophils Relative %: 61 %
Platelets: 283 10*3/uL (ref 150–400)
RBC: 2.35 MIL/uL — ABNORMAL LOW (ref 4.22–5.81)
RDW: 17.2 % — ABNORMAL HIGH (ref 11.5–15.5)
WBC: 5.4 10*3/uL (ref 4.0–10.5)
nRBC: 0 % (ref 0.0–0.2)

## 2023-04-01 LAB — RENAL FUNCTION PANEL
Albumin: 2.2 g/dL — ABNORMAL LOW (ref 3.5–5.0)
Anion gap: 20 — ABNORMAL HIGH (ref 5–15)
BUN: 37 mg/dL — ABNORMAL HIGH (ref 6–20)
CO2: 21 mmol/L — ABNORMAL LOW (ref 22–32)
Calcium: 9.6 mg/dL (ref 8.9–10.3)
Chloride: 92 mmol/L — ABNORMAL LOW (ref 98–111)
Creatinine, Ser: 6.68 mg/dL — ABNORMAL HIGH (ref 0.61–1.24)
GFR, Estimated: 10 mL/min — ABNORMAL LOW (ref >60)
Glucose, Bld: 93 mg/dL (ref 70–99)
Phosphorus: 5.6 mg/dL — ABNORMAL HIGH (ref 2.5–4.6)
Potassium: 4.4 mmol/L (ref 3.5–5.1)
Sodium: 133 mmol/L — ABNORMAL LOW (ref 135–145)

## 2023-04-02 DIAGNOSIS — I482 Chronic atrial fibrillation, unspecified: Secondary | ICD-10-CM

## 2023-04-02 DIAGNOSIS — D638 Anemia in other chronic diseases classified elsewhere: Secondary | ICD-10-CM | POA: Diagnosis not present

## 2023-04-02 DIAGNOSIS — R6520 Severe sepsis without septic shock: Secondary | ICD-10-CM

## 2023-04-02 DIAGNOSIS — N186 End stage renal disease: Secondary | ICD-10-CM

## 2023-04-02 DIAGNOSIS — E876 Hypokalemia: Secondary | ICD-10-CM | POA: Diagnosis not present

## 2023-04-02 DIAGNOSIS — J9601 Acute respiratory failure with hypoxia: Secondary | ICD-10-CM

## 2023-04-03 DIAGNOSIS — N186 End stage renal disease: Secondary | ICD-10-CM

## 2023-04-03 DIAGNOSIS — I482 Chronic atrial fibrillation, unspecified: Secondary | ICD-10-CM

## 2023-04-03 DIAGNOSIS — R6520 Severe sepsis without septic shock: Secondary | ICD-10-CM

## 2023-04-03 DIAGNOSIS — J9601 Acute respiratory failure with hypoxia: Secondary | ICD-10-CM

## 2023-04-03 DIAGNOSIS — E876 Hypokalemia: Secondary | ICD-10-CM | POA: Diagnosis not present

## 2023-04-03 DIAGNOSIS — D638 Anemia in other chronic diseases classified elsewhere: Secondary | ICD-10-CM | POA: Diagnosis not present

## 2023-04-04 DIAGNOSIS — R6520 Severe sepsis without septic shock: Secondary | ICD-10-CM | POA: Diagnosis not present

## 2023-04-04 DIAGNOSIS — I482 Chronic atrial fibrillation, unspecified: Secondary | ICD-10-CM | POA: Diagnosis not present

## 2023-04-04 DIAGNOSIS — D631 Anemia in chronic kidney disease: Secondary | ICD-10-CM | POA: Diagnosis not present

## 2023-04-04 DIAGNOSIS — J9601 Acute respiratory failure with hypoxia: Secondary | ICD-10-CM | POA: Diagnosis not present

## 2023-04-04 DIAGNOSIS — N2581 Secondary hyperparathyroidism of renal origin: Secondary | ICD-10-CM | POA: Diagnosis not present

## 2023-04-04 DIAGNOSIS — I959 Hypotension, unspecified: Secondary | ICD-10-CM | POA: Diagnosis not present

## 2023-04-04 DIAGNOSIS — N186 End stage renal disease: Secondary | ICD-10-CM | POA: Diagnosis not present

## 2023-04-04 LAB — RENAL FUNCTION PANEL
Albumin: 2.5 g/dL — ABNORMAL LOW (ref 3.5–5.0)
Anion gap: 18 — ABNORMAL HIGH (ref 5–15)
BUN: 33 mg/dL — ABNORMAL HIGH (ref 6–20)
CO2: 23 mmol/L (ref 22–32)
Calcium: 10 mg/dL (ref 8.9–10.3)
Chloride: 93 mmol/L — ABNORMAL LOW (ref 98–111)
Creatinine, Ser: 7.54 mg/dL — ABNORMAL HIGH (ref 0.61–1.24)
GFR, Estimated: 9 mL/min — ABNORMAL LOW (ref >60)
Glucose, Bld: 77 mg/dL (ref 70–99)
Phosphorus: 5.8 mg/dL — ABNORMAL HIGH (ref 2.5–4.6)
Potassium: 4.7 mmol/L (ref 3.5–5.1)
Sodium: 134 mmol/L — ABNORMAL LOW (ref 135–145)

## 2023-04-04 LAB — CBC WITH DIFFERENTIAL/PLATELET
Abs Immature Granulocytes: 0.07 10*3/uL (ref 0.00–0.07)
Basophils Absolute: 0 10*3/uL (ref 0.0–0.1)
Basophils Relative: 1 %
Eosinophils Absolute: 0.3 10*3/uL (ref 0.0–0.5)
Eosinophils Relative: 6 %
HCT: 29.6 % — ABNORMAL LOW (ref 39.0–52.0)
Hemoglobin: 9.1 g/dL — ABNORMAL LOW (ref 13.0–17.0)
Immature Granulocytes: 1 %
Lymphocytes Relative: 20 %
Lymphs Abs: 1 10*3/uL (ref 0.7–4.0)
MCH: 32.2 pg (ref 26.0–34.0)
MCHC: 30.7 g/dL (ref 30.0–36.0)
MCV: 104.6 fL — ABNORMAL HIGH (ref 80.0–100.0)
Monocytes Absolute: 0.8 10*3/uL (ref 0.1–1.0)
Monocytes Relative: 15 %
Neutro Abs: 2.8 10*3/uL (ref 1.7–7.7)
Neutrophils Relative %: 57 %
Platelets: UNDETERMINED 10*3/uL (ref 150–400)
RBC: 2.83 MIL/uL — ABNORMAL LOW (ref 4.22–5.81)
RDW: 17.1 % — ABNORMAL HIGH (ref 11.5–15.5)
WBC: 5 10*3/uL (ref 4.0–10.5)
nRBC: 0 % (ref 0.0–0.2)

## 2023-04-04 LAB — HEPATITIS B SURFACE ANTIGEN: Hepatitis B Surface Ag: NONREACTIVE

## 2023-04-05 DIAGNOSIS — R6520 Severe sepsis without septic shock: Secondary | ICD-10-CM | POA: Diagnosis not present

## 2023-04-05 DIAGNOSIS — J9601 Acute respiratory failure with hypoxia: Secondary | ICD-10-CM | POA: Diagnosis not present

## 2023-04-05 DIAGNOSIS — I482 Chronic atrial fibrillation, unspecified: Secondary | ICD-10-CM | POA: Diagnosis not present

## 2023-04-05 DIAGNOSIS — N186 End stage renal disease: Secondary | ICD-10-CM | POA: Diagnosis not present

## 2023-04-06 DIAGNOSIS — N2581 Secondary hyperparathyroidism of renal origin: Secondary | ICD-10-CM | POA: Diagnosis not present

## 2023-04-06 DIAGNOSIS — D631 Anemia in chronic kidney disease: Secondary | ICD-10-CM | POA: Diagnosis not present

## 2023-04-06 DIAGNOSIS — I482 Chronic atrial fibrillation, unspecified: Secondary | ICD-10-CM

## 2023-04-06 DIAGNOSIS — I959 Hypotension, unspecified: Secondary | ICD-10-CM | POA: Diagnosis not present

## 2023-04-06 DIAGNOSIS — R620 Delayed milestone in childhood: Secondary | ICD-10-CM

## 2023-04-06 DIAGNOSIS — J9601 Acute respiratory failure with hypoxia: Secondary | ICD-10-CM

## 2023-04-06 DIAGNOSIS — N186 End stage renal disease: Secondary | ICD-10-CM

## 2023-04-06 LAB — CBC WITH DIFFERENTIAL/PLATELET
Abs Immature Granulocytes: 0.03 10*3/uL (ref 0.00–0.07)
Basophils Absolute: 0 10*3/uL (ref 0.0–0.1)
Basophils Relative: 1 %
Eosinophils Absolute: 0.2 10*3/uL (ref 0.0–0.5)
Eosinophils Relative: 4 %
HCT: 27.9 % — ABNORMAL LOW (ref 39.0–52.0)
Hemoglobin: 8.4 g/dL — ABNORMAL LOW (ref 13.0–17.0)
Immature Granulocytes: 1 %
Lymphocytes Relative: 20 %
Lymphs Abs: 1 10*3/uL (ref 0.7–4.0)
MCH: 31.8 pg (ref 26.0–34.0)
MCHC: 30.1 g/dL (ref 30.0–36.0)
MCV: 105.7 fL — ABNORMAL HIGH (ref 80.0–100.0)
Monocytes Absolute: 0.7 10*3/uL (ref 0.1–1.0)
Monocytes Relative: 14 %
Neutro Abs: 2.9 10*3/uL (ref 1.7–7.7)
Neutrophils Relative %: 60 %
Platelets: 275 10*3/uL (ref 150–400)
RBC: 2.64 MIL/uL — ABNORMAL LOW (ref 4.22–5.81)
RDW: 17.5 % — ABNORMAL HIGH (ref 11.5–15.5)
WBC: 4.8 10*3/uL (ref 4.0–10.5)
nRBC: 0 % (ref 0.0–0.2)

## 2023-04-06 LAB — RENAL FUNCTION PANEL
Albumin: 2.3 g/dL — ABNORMAL LOW (ref 3.5–5.0)
Anion gap: 16 — ABNORMAL HIGH (ref 5–15)
BUN: 18 mg/dL (ref 6–20)
CO2: 21 mmol/L — ABNORMAL LOW (ref 22–32)
Calcium: 10.3 mg/dL (ref 8.9–10.3)
Chloride: 96 mmol/L — ABNORMAL LOW (ref 98–111)
Creatinine, Ser: 6.37 mg/dL — ABNORMAL HIGH (ref 0.61–1.24)
GFR, Estimated: 11 mL/min — ABNORMAL LOW (ref >60)
Glucose, Bld: 75 mg/dL (ref 70–99)
Phosphorus: 4.9 mg/dL — ABNORMAL HIGH (ref 2.5–4.6)
Potassium: 4.4 mmol/L (ref 3.5–5.1)
Sodium: 133 mmol/L — ABNORMAL LOW (ref 135–145)

## 2023-04-06 LAB — DIGOXIN LEVEL: Digoxin Level: 1.4 ng/mL (ref 0.8–2.0)

## 2023-04-07 DIAGNOSIS — N186 End stage renal disease: Secondary | ICD-10-CM | POA: Diagnosis not present

## 2023-04-07 DIAGNOSIS — J9621 Acute and chronic respiratory failure with hypoxia: Secondary | ICD-10-CM | POA: Diagnosis not present

## 2023-04-07 DIAGNOSIS — I482 Chronic atrial fibrillation, unspecified: Secondary | ICD-10-CM | POA: Diagnosis not present

## 2023-04-07 DIAGNOSIS — R6520 Severe sepsis without septic shock: Secondary | ICD-10-CM | POA: Diagnosis not present

## 2023-04-08 DIAGNOSIS — J9621 Acute and chronic respiratory failure with hypoxia: Secondary | ICD-10-CM | POA: Diagnosis not present

## 2023-04-08 DIAGNOSIS — I959 Hypotension, unspecified: Secondary | ICD-10-CM | POA: Diagnosis not present

## 2023-04-08 DIAGNOSIS — N186 End stage renal disease: Secondary | ICD-10-CM | POA: Diagnosis not present

## 2023-04-08 DIAGNOSIS — N2581 Secondary hyperparathyroidism of renal origin: Secondary | ICD-10-CM | POA: Diagnosis not present

## 2023-04-08 DIAGNOSIS — I482 Chronic atrial fibrillation, unspecified: Secondary | ICD-10-CM | POA: Diagnosis not present

## 2023-04-08 DIAGNOSIS — D631 Anemia in chronic kidney disease: Secondary | ICD-10-CM | POA: Diagnosis not present

## 2023-04-08 DIAGNOSIS — R6520 Severe sepsis without septic shock: Secondary | ICD-10-CM | POA: Diagnosis not present

## 2023-04-08 LAB — CBC WITH DIFFERENTIAL/PLATELET
Abs Immature Granulocytes: 0.04 10*3/uL (ref 0.00–0.07)
Basophils Absolute: 0 10*3/uL (ref 0.0–0.1)
Basophils Relative: 1 %
Eosinophils Absolute: 0.2 10*3/uL (ref 0.0–0.5)
Eosinophils Relative: 4 %
HCT: 29.3 % — ABNORMAL LOW (ref 39.0–52.0)
Hemoglobin: 8.6 g/dL — ABNORMAL LOW (ref 13.0–17.0)
Immature Granulocytes: 1 %
Lymphocytes Relative: 12 %
Lymphs Abs: 0.7 10*3/uL (ref 0.7–4.0)
MCH: 31.2 pg (ref 26.0–34.0)
MCHC: 29.4 g/dL — ABNORMAL LOW (ref 30.0–36.0)
MCV: 106.2 fL — ABNORMAL HIGH (ref 80.0–100.0)
Monocytes Absolute: 0.8 10*3/uL (ref 0.1–1.0)
Monocytes Relative: 13 %
Neutro Abs: 4.4 10*3/uL (ref 1.7–7.7)
Neutrophils Relative %: 69 %
Platelets: 307 10*3/uL (ref 150–400)
RBC: 2.76 MIL/uL — ABNORMAL LOW (ref 4.22–5.81)
RDW: 17.4 % — ABNORMAL HIGH (ref 11.5–15.5)
WBC: 6.2 10*3/uL (ref 4.0–10.5)
nRBC: 0 % (ref 0.0–0.2)

## 2023-04-08 LAB — RENAL FUNCTION PANEL
Albumin: 2.3 g/dL — ABNORMAL LOW (ref 3.5–5.0)
Anion gap: 15 (ref 5–15)
BUN: 19 mg/dL (ref 6–20)
CO2: 23 mmol/L (ref 22–32)
Calcium: 10.2 mg/dL (ref 8.9–10.3)
Chloride: 96 mmol/L — ABNORMAL LOW (ref 98–111)
Creatinine, Ser: 6.8 mg/dL — ABNORMAL HIGH (ref 0.61–1.24)
GFR, Estimated: 10 mL/min — ABNORMAL LOW (ref >60)
Glucose, Bld: 92 mg/dL (ref 70–99)
Phosphorus: 4.6 mg/dL (ref 2.5–4.6)
Potassium: 4.5 mmol/L (ref 3.5–5.1)
Sodium: 134 mmol/L — ABNORMAL LOW (ref 135–145)

## 2023-04-11 DIAGNOSIS — I482 Chronic atrial fibrillation, unspecified: Secondary | ICD-10-CM | POA: Diagnosis not present

## 2023-04-11 DIAGNOSIS — N2581 Secondary hyperparathyroidism of renal origin: Secondary | ICD-10-CM | POA: Diagnosis not present

## 2023-04-11 DIAGNOSIS — N186 End stage renal disease: Secondary | ICD-10-CM | POA: Diagnosis not present

## 2023-04-11 DIAGNOSIS — D631 Anemia in chronic kidney disease: Secondary | ICD-10-CM | POA: Diagnosis not present

## 2023-04-11 DIAGNOSIS — R6520 Severe sepsis without septic shock: Secondary | ICD-10-CM | POA: Diagnosis not present

## 2023-04-11 DIAGNOSIS — I959 Hypotension, unspecified: Secondary | ICD-10-CM | POA: Diagnosis not present

## 2023-04-11 DIAGNOSIS — J9621 Acute and chronic respiratory failure with hypoxia: Secondary | ICD-10-CM | POA: Diagnosis not present

## 2023-04-11 LAB — RENAL FUNCTION PANEL
Albumin: 2.3 g/dL — ABNORMAL LOW (ref 3.5–5.0)
Anion gap: 13 (ref 5–15)
BUN: 24 mg/dL — ABNORMAL HIGH (ref 6–20)
CO2: 24 mmol/L (ref 22–32)
Calcium: 10.1 mg/dL (ref 8.9–10.3)
Chloride: 97 mmol/L — ABNORMAL LOW (ref 98–111)
Creatinine, Ser: 7.64 mg/dL — ABNORMAL HIGH (ref 0.61–1.24)
GFR, Estimated: 9 mL/min — ABNORMAL LOW (ref >60)
Glucose, Bld: 81 mg/dL (ref 70–99)
Phosphorus: 4.7 mg/dL — ABNORMAL HIGH (ref 2.5–4.6)
Potassium: 5 mmol/L (ref 3.5–5.1)
Sodium: 134 mmol/L — ABNORMAL LOW (ref 135–145)

## 2023-04-11 LAB — CBC WITH DIFFERENTIAL/PLATELET
Abs Immature Granulocytes: 0.02 10*3/uL (ref 0.00–0.07)
Basophils Absolute: 0.1 10*3/uL (ref 0.0–0.1)
Basophils Relative: 1 %
Eosinophils Absolute: 0.3 10*3/uL (ref 0.0–0.5)
Eosinophils Relative: 5 %
HCT: 29.3 % — ABNORMAL LOW (ref 39.0–52.0)
Hemoglobin: 8.6 g/dL — ABNORMAL LOW (ref 13.0–17.0)
Immature Granulocytes: 0 %
Lymphocytes Relative: 16 %
Lymphs Abs: 1.1 10*3/uL (ref 0.7–4.0)
MCH: 31.5 pg (ref 26.0–34.0)
MCHC: 29.4 g/dL — ABNORMAL LOW (ref 30.0–36.0)
MCV: 107.3 fL — ABNORMAL HIGH (ref 80.0–100.0)
Monocytes Absolute: 0.9 10*3/uL (ref 0.1–1.0)
Monocytes Relative: 14 %
Neutro Abs: 4.1 10*3/uL (ref 1.7–7.7)
Neutrophils Relative %: 64 %
Platelets: 274 10*3/uL (ref 150–400)
RBC: 2.73 MIL/uL — ABNORMAL LOW (ref 4.22–5.81)
RDW: 17.4 % — ABNORMAL HIGH (ref 11.5–15.5)
WBC: 6.5 10*3/uL (ref 4.0–10.5)
nRBC: 0 % (ref 0.0–0.2)

## 2023-04-12 DIAGNOSIS — N186 End stage renal disease: Secondary | ICD-10-CM | POA: Diagnosis not present

## 2023-04-12 DIAGNOSIS — J9601 Acute respiratory failure with hypoxia: Secondary | ICD-10-CM | POA: Diagnosis not present

## 2023-04-12 DIAGNOSIS — R6520 Severe sepsis without septic shock: Secondary | ICD-10-CM | POA: Diagnosis not present

## 2023-04-12 DIAGNOSIS — I482 Chronic atrial fibrillation, unspecified: Secondary | ICD-10-CM | POA: Diagnosis not present

## 2023-04-13 DIAGNOSIS — I482 Chronic atrial fibrillation, unspecified: Secondary | ICD-10-CM | POA: Diagnosis not present

## 2023-04-13 DIAGNOSIS — N2581 Secondary hyperparathyroidism of renal origin: Secondary | ICD-10-CM | POA: Diagnosis not present

## 2023-04-13 DIAGNOSIS — J9601 Acute respiratory failure with hypoxia: Secondary | ICD-10-CM | POA: Diagnosis not present

## 2023-04-13 DIAGNOSIS — D631 Anemia in chronic kidney disease: Secondary | ICD-10-CM | POA: Diagnosis not present

## 2023-04-13 DIAGNOSIS — N186 End stage renal disease: Secondary | ICD-10-CM | POA: Diagnosis not present

## 2023-04-13 DIAGNOSIS — I959 Hypotension, unspecified: Secondary | ICD-10-CM | POA: Diagnosis not present

## 2023-04-13 DIAGNOSIS — R6520 Severe sepsis without septic shock: Secondary | ICD-10-CM | POA: Diagnosis not present

## 2023-04-14 DIAGNOSIS — R6520 Severe sepsis without septic shock: Secondary | ICD-10-CM | POA: Diagnosis not present

## 2023-04-14 DIAGNOSIS — N186 End stage renal disease: Secondary | ICD-10-CM | POA: Diagnosis not present

## 2023-04-14 DIAGNOSIS — J9601 Acute respiratory failure with hypoxia: Secondary | ICD-10-CM | POA: Diagnosis not present

## 2023-04-14 DIAGNOSIS — I482 Chronic atrial fibrillation, unspecified: Secondary | ICD-10-CM | POA: Diagnosis not present

## 2023-04-14 LAB — VITAMIN B12: Vitamin B-12: 1014 pg/mL — ABNORMAL HIGH (ref 180–914)

## 2023-04-15 DIAGNOSIS — J9601 Acute respiratory failure with hypoxia: Secondary | ICD-10-CM | POA: Diagnosis not present

## 2023-04-15 DIAGNOSIS — D631 Anemia in chronic kidney disease: Secondary | ICD-10-CM | POA: Diagnosis not present

## 2023-04-15 DIAGNOSIS — N2581 Secondary hyperparathyroidism of renal origin: Secondary | ICD-10-CM | POA: Diagnosis not present

## 2023-04-15 DIAGNOSIS — I482 Chronic atrial fibrillation, unspecified: Secondary | ICD-10-CM | POA: Diagnosis not present

## 2023-04-15 DIAGNOSIS — N186 End stage renal disease: Secondary | ICD-10-CM | POA: Diagnosis not present

## 2023-04-15 DIAGNOSIS — I959 Hypotension, unspecified: Secondary | ICD-10-CM | POA: Diagnosis not present

## 2023-04-15 DIAGNOSIS — R6520 Severe sepsis without septic shock: Secondary | ICD-10-CM | POA: Diagnosis not present

## 2023-04-15 LAB — CBC WITH DIFFERENTIAL/PLATELET
Abs Immature Granulocytes: 0.02 10*3/uL (ref 0.00–0.07)
Basophils Absolute: 0.1 10*3/uL (ref 0.0–0.1)
Basophils Relative: 1 %
Eosinophils Absolute: 0.3 10*3/uL (ref 0.0–0.5)
Eosinophils Relative: 6 %
HCT: 31.9 % — ABNORMAL LOW (ref 39.0–52.0)
Hemoglobin: 9.4 g/dL — ABNORMAL LOW (ref 13.0–17.0)
Immature Granulocytes: 0 %
Lymphocytes Relative: 20 %
Lymphs Abs: 1 10*3/uL (ref 0.7–4.0)
MCH: 31.2 pg (ref 26.0–34.0)
MCHC: 29.5 g/dL — ABNORMAL LOW (ref 30.0–36.0)
MCV: 106 fL — ABNORMAL HIGH (ref 80.0–100.0)
Monocytes Absolute: 0.8 10*3/uL (ref 0.1–1.0)
Monocytes Relative: 16 %
Neutro Abs: 2.8 10*3/uL (ref 1.7–7.7)
Neutrophils Relative %: 57 %
Platelets: 282 10*3/uL (ref 150–400)
RBC: 3.01 MIL/uL — ABNORMAL LOW (ref 4.22–5.81)
RDW: 17.1 % — ABNORMAL HIGH (ref 11.5–15.5)
WBC: 5 10*3/uL (ref 4.0–10.5)
nRBC: 0 % (ref 0.0–0.2)

## 2023-04-15 LAB — RENAL FUNCTION PANEL
Albumin: 2.6 g/dL — ABNORMAL LOW (ref 3.5–5.0)
Anion gap: 12 (ref 5–15)
BUN: 15 mg/dL (ref 6–20)
CO2: 23 mmol/L (ref 22–32)
Calcium: 10.5 mg/dL — ABNORMAL HIGH (ref 8.9–10.3)
Chloride: 100 mmol/L (ref 98–111)
Creatinine, Ser: 6.18 mg/dL — ABNORMAL HIGH (ref 0.61–1.24)
GFR, Estimated: 11 mL/min — ABNORMAL LOW (ref >60)
Glucose, Bld: 87 mg/dL (ref 70–99)
Phosphorus: 4.3 mg/dL (ref 2.5–4.6)
Potassium: 4.6 mmol/L (ref 3.5–5.1)
Sodium: 135 mmol/L (ref 135–145)

## 2023-04-16 DIAGNOSIS — J9601 Acute respiratory failure with hypoxia: Secondary | ICD-10-CM | POA: Diagnosis not present

## 2023-04-16 DIAGNOSIS — N186 End stage renal disease: Secondary | ICD-10-CM | POA: Diagnosis not present

## 2023-04-16 DIAGNOSIS — R6520 Severe sepsis without septic shock: Secondary | ICD-10-CM | POA: Diagnosis not present

## 2023-04-16 DIAGNOSIS — I482 Chronic atrial fibrillation, unspecified: Secondary | ICD-10-CM | POA: Diagnosis not present

## 2023-04-17 DIAGNOSIS — R6520 Severe sepsis without septic shock: Secondary | ICD-10-CM | POA: Diagnosis not present

## 2023-04-17 DIAGNOSIS — N186 End stage renal disease: Secondary | ICD-10-CM | POA: Diagnosis not present

## 2023-04-17 DIAGNOSIS — J9601 Acute respiratory failure with hypoxia: Secondary | ICD-10-CM | POA: Diagnosis not present

## 2023-04-17 DIAGNOSIS — I482 Chronic atrial fibrillation, unspecified: Secondary | ICD-10-CM | POA: Diagnosis not present

## 2023-04-18 DIAGNOSIS — D631 Anemia in chronic kidney disease: Secondary | ICD-10-CM | POA: Diagnosis not present

## 2023-04-18 DIAGNOSIS — N2581 Secondary hyperparathyroidism of renal origin: Secondary | ICD-10-CM | POA: Diagnosis not present

## 2023-04-18 DIAGNOSIS — I482 Chronic atrial fibrillation, unspecified: Secondary | ICD-10-CM | POA: Diagnosis not present

## 2023-04-18 DIAGNOSIS — I959 Hypotension, unspecified: Secondary | ICD-10-CM | POA: Diagnosis not present

## 2023-04-18 DIAGNOSIS — R6520 Severe sepsis without septic shock: Secondary | ICD-10-CM | POA: Diagnosis not present

## 2023-04-18 DIAGNOSIS — N186 End stage renal disease: Secondary | ICD-10-CM | POA: Diagnosis not present

## 2023-04-18 DIAGNOSIS — J9621 Acute and chronic respiratory failure with hypoxia: Secondary | ICD-10-CM | POA: Diagnosis not present

## 2023-04-18 LAB — CBC WITH DIFFERENTIAL/PLATELET
Abs Immature Granulocytes: 0.02 10*3/uL (ref 0.00–0.07)
Basophils Absolute: 0.1 10*3/uL (ref 0.0–0.1)
Basophils Relative: 1 %
Eosinophils Absolute: 0.4 10*3/uL (ref 0.0–0.5)
Eosinophils Relative: 9 %
HCT: 33.7 % — ABNORMAL LOW (ref 39.0–52.0)
Hemoglobin: 10.2 g/dL — ABNORMAL LOW (ref 13.0–17.0)
Immature Granulocytes: 0 %
Lymphocytes Relative: 23 %
Lymphs Abs: 1.1 10*3/uL (ref 0.7–4.0)
MCH: 32.7 pg (ref 26.0–34.0)
MCHC: 30.3 g/dL (ref 30.0–36.0)
MCV: 108 fL — ABNORMAL HIGH (ref 80.0–100.0)
Monocytes Absolute: 0.8 10*3/uL (ref 0.1–1.0)
Monocytes Relative: 17 %
Neutro Abs: 2.4 10*3/uL (ref 1.7–7.7)
Neutrophils Relative %: 50 %
Platelets: 274 10*3/uL (ref 150–400)
RBC: 3.12 MIL/uL — ABNORMAL LOW (ref 4.22–5.81)
RDW: 16.7 % — ABNORMAL HIGH (ref 11.5–15.5)
WBC: 4.7 10*3/uL (ref 4.0–10.5)
nRBC: 0 % (ref 0.0–0.2)

## 2023-04-18 LAB — RENAL FUNCTION PANEL
Albumin: 2.9 g/dL — ABNORMAL LOW (ref 3.5–5.0)
Anion gap: 15 (ref 5–15)
BUN: 29 mg/dL — ABNORMAL HIGH (ref 6–20)
CO2: 23 mmol/L (ref 22–32)
Calcium: 11.1 mg/dL — ABNORMAL HIGH (ref 8.9–10.3)
Chloride: 95 mmol/L — ABNORMAL LOW (ref 98–111)
Creatinine, Ser: 6.78 mg/dL — ABNORMAL HIGH (ref 0.61–1.24)
GFR, Estimated: 10 mL/min — ABNORMAL LOW (ref >60)
Glucose, Bld: 77 mg/dL (ref 70–99)
Phosphorus: 4.5 mg/dL (ref 2.5–4.6)
Potassium: 5.2 mmol/L — ABNORMAL HIGH (ref 3.5–5.1)
Sodium: 133 mmol/L — ABNORMAL LOW (ref 135–145)

## 2023-04-19 DIAGNOSIS — I482 Chronic atrial fibrillation, unspecified: Secondary | ICD-10-CM | POA: Diagnosis not present

## 2023-04-19 DIAGNOSIS — N186 End stage renal disease: Secondary | ICD-10-CM | POA: Diagnosis not present

## 2023-04-19 DIAGNOSIS — R6520 Severe sepsis without septic shock: Secondary | ICD-10-CM | POA: Diagnosis not present

## 2023-04-19 DIAGNOSIS — J9621 Acute and chronic respiratory failure with hypoxia: Secondary | ICD-10-CM | POA: Diagnosis not present

## 2023-04-19 LAB — POTASSIUM: Potassium: 4.5 mmol/L (ref 3.5–5.1)

## 2023-04-20 DIAGNOSIS — R6520 Severe sepsis without septic shock: Secondary | ICD-10-CM | POA: Diagnosis not present

## 2023-04-20 DIAGNOSIS — I482 Chronic atrial fibrillation, unspecified: Secondary | ICD-10-CM | POA: Diagnosis not present

## 2023-04-20 DIAGNOSIS — D631 Anemia in chronic kidney disease: Secondary | ICD-10-CM | POA: Diagnosis not present

## 2023-04-20 DIAGNOSIS — N186 End stage renal disease: Secondary | ICD-10-CM | POA: Diagnosis not present

## 2023-04-20 DIAGNOSIS — J9621 Acute and chronic respiratory failure with hypoxia: Secondary | ICD-10-CM | POA: Diagnosis not present

## 2023-04-20 DIAGNOSIS — N2581 Secondary hyperparathyroidism of renal origin: Secondary | ICD-10-CM | POA: Diagnosis not present

## 2023-04-20 DIAGNOSIS — I959 Hypotension, unspecified: Secondary | ICD-10-CM | POA: Diagnosis not present

## 2023-04-20 LAB — CBC WITH DIFFERENTIAL/PLATELET
Abs Immature Granulocytes: 0.01 10*3/uL (ref 0.00–0.07)
Basophils Absolute: 0.1 10*3/uL (ref 0.0–0.1)
Basophils Relative: 1 %
Eosinophils Absolute: 0.4 10*3/uL (ref 0.0–0.5)
Eosinophils Relative: 9 %
HCT: 30.6 % — ABNORMAL LOW (ref 39.0–52.0)
Hemoglobin: 9.4 g/dL — ABNORMAL LOW (ref 13.0–17.0)
Immature Granulocytes: 0 %
Lymphocytes Relative: 20 %
Lymphs Abs: 0.8 10*3/uL (ref 0.7–4.0)
MCH: 32.9 pg (ref 26.0–34.0)
MCHC: 30.7 g/dL (ref 30.0–36.0)
MCV: 107 fL — ABNORMAL HIGH (ref 80.0–100.0)
Monocytes Absolute: 0.9 10*3/uL (ref 0.1–1.0)
Monocytes Relative: 22 %
Neutro Abs: 1.9 10*3/uL (ref 1.7–7.7)
Neutrophils Relative %: 48 %
Platelets: 252 10*3/uL (ref 150–400)
RBC: 2.86 MIL/uL — ABNORMAL LOW (ref 4.22–5.81)
RDW: 16.6 % — ABNORMAL HIGH (ref 11.5–15.5)
WBC: 4.1 10*3/uL (ref 4.0–10.5)
nRBC: 0 % (ref 0.0–0.2)

## 2023-04-20 LAB — RENAL FUNCTION PANEL
Albumin: 2.6 g/dL — ABNORMAL LOW (ref 3.5–5.0)
Anion gap: 13 (ref 5–15)
BUN: 23 mg/dL — ABNORMAL HIGH (ref 6–20)
CO2: 25 mmol/L (ref 22–32)
Calcium: 10.9 mg/dL — ABNORMAL HIGH (ref 8.9–10.3)
Chloride: 97 mmol/L — ABNORMAL LOW (ref 98–111)
Creatinine, Ser: 6.61 mg/dL — ABNORMAL HIGH (ref 0.61–1.24)
GFR, Estimated: 10 mL/min — ABNORMAL LOW (ref >60)
Glucose, Bld: 85 mg/dL (ref 70–99)
Phosphorus: 4.7 mg/dL — ABNORMAL HIGH (ref 2.5–4.6)
Potassium: 4.4 mmol/L (ref 3.5–5.1)
Sodium: 135 mmol/L (ref 135–145)

## 2023-04-21 DIAGNOSIS — R260 Ataxic gait: Secondary | ICD-10-CM | POA: Diagnosis not present

## 2023-04-21 DIAGNOSIS — I10 Essential (primary) hypertension: Secondary | ICD-10-CM | POA: Diagnosis not present

## 2023-04-21 DIAGNOSIS — J9621 Acute and chronic respiratory failure with hypoxia: Secondary | ICD-10-CM | POA: Diagnosis not present

## 2023-04-21 DIAGNOSIS — I132 Hypertensive heart and chronic kidney disease with heart failure and with stage 5 chronic kidney disease, or end stage renal disease: Secondary | ICD-10-CM | POA: Diagnosis not present

## 2023-04-21 DIAGNOSIS — R2689 Other abnormalities of gait and mobility: Secondary | ICD-10-CM | POA: Diagnosis not present

## 2023-04-21 DIAGNOSIS — N186 End stage renal disease: Secondary | ICD-10-CM | POA: Diagnosis not present

## 2023-04-21 DIAGNOSIS — E236 Other disorders of pituitary gland: Secondary | ICD-10-CM | POA: Diagnosis not present

## 2023-04-21 DIAGNOSIS — J9600 Acute respiratory failure, unspecified whether with hypoxia or hypercapnia: Secondary | ICD-10-CM | POA: Diagnosis not present

## 2023-04-21 DIAGNOSIS — I428 Other cardiomyopathies: Secondary | ICD-10-CM | POA: Diagnosis not present

## 2023-04-21 DIAGNOSIS — D638 Anemia in other chronic diseases classified elsewhere: Secondary | ICD-10-CM | POA: Diagnosis not present

## 2023-04-21 DIAGNOSIS — I480 Paroxysmal atrial fibrillation: Secondary | ICD-10-CM | POA: Diagnosis not present

## 2023-04-21 DIAGNOSIS — Z7401 Bed confinement status: Secondary | ICD-10-CM | POA: Diagnosis not present

## 2023-04-21 DIAGNOSIS — I469 Cardiac arrest, cause unspecified: Secondary | ICD-10-CM | POA: Diagnosis not present

## 2023-04-21 DIAGNOSIS — E039 Hypothyroidism, unspecified: Secondary | ICD-10-CM | POA: Diagnosis not present

## 2023-04-21 DIAGNOSIS — E876 Hypokalemia: Secondary | ICD-10-CM | POA: Diagnosis not present

## 2023-04-21 DIAGNOSIS — I499 Cardiac arrhythmia, unspecified: Secondary | ICD-10-CM | POA: Diagnosis not present

## 2023-04-21 DIAGNOSIS — J9610 Chronic respiratory failure, unspecified whether with hypoxia or hypercapnia: Secondary | ICD-10-CM | POA: Diagnosis not present

## 2023-04-21 DIAGNOSIS — I482 Chronic atrial fibrillation, unspecified: Secondary | ICD-10-CM | POA: Diagnosis not present

## 2023-04-21 DIAGNOSIS — R531 Weakness: Secondary | ICD-10-CM | POA: Diagnosis not present

## 2023-04-21 DIAGNOSIS — N2581 Secondary hyperparathyroidism of renal origin: Secondary | ICD-10-CM | POA: Diagnosis not present

## 2023-04-21 DIAGNOSIS — I5082 Biventricular heart failure: Secondary | ICD-10-CM | POA: Diagnosis not present

## 2023-04-21 DIAGNOSIS — I4891 Unspecified atrial fibrillation: Secondary | ICD-10-CM | POA: Diagnosis not present

## 2023-04-21 DIAGNOSIS — Z992 Dependence on renal dialysis: Secondary | ICD-10-CM | POA: Diagnosis not present

## 2023-04-21 DIAGNOSIS — J9690 Respiratory failure, unspecified, unspecified whether with hypoxia or hypercapnia: Secondary | ICD-10-CM | POA: Diagnosis not present

## 2023-04-21 DIAGNOSIS — G629 Polyneuropathy, unspecified: Secondary | ICD-10-CM | POA: Diagnosis not present

## 2023-04-21 DIAGNOSIS — I5023 Acute on chronic systolic (congestive) heart failure: Secondary | ICD-10-CM | POA: Diagnosis not present

## 2023-04-21 DIAGNOSIS — I951 Orthostatic hypotension: Secondary | ICD-10-CM | POA: Diagnosis not present

## 2023-04-21 DIAGNOSIS — R6520 Severe sepsis without septic shock: Secondary | ICD-10-CM | POA: Diagnosis not present

## 2023-04-21 DIAGNOSIS — E1139 Type 2 diabetes mellitus with other diabetic ophthalmic complication: Secondary | ICD-10-CM | POA: Diagnosis not present

## 2023-04-21 DIAGNOSIS — I509 Heart failure, unspecified: Secondary | ICD-10-CM | POA: Diagnosis not present

## 2023-04-21 DIAGNOSIS — J9601 Acute respiratory failure with hypoxia: Secondary | ICD-10-CM | POA: Diagnosis not present

## 2023-04-21 DIAGNOSIS — R0902 Hypoxemia: Secondary | ICD-10-CM | POA: Diagnosis not present

## 2023-04-21 DIAGNOSIS — I5020 Unspecified systolic (congestive) heart failure: Secondary | ICD-10-CM | POA: Diagnosis not present

## 2023-04-21 DIAGNOSIS — E662 Morbid (severe) obesity with alveolar hypoventilation: Secondary | ICD-10-CM | POA: Diagnosis not present

## 2023-04-21 LAB — TSH: TSH: 4.9 u[IU]/mL — ABNORMAL HIGH (ref 0.350–4.500)

## 2023-04-21 LAB — T4, FREE: Free T4: 0.92 ng/dL (ref 0.61–1.12)

## 2023-04-22 DIAGNOSIS — Z992 Dependence on renal dialysis: Secondary | ICD-10-CM | POA: Diagnosis not present

## 2023-04-22 DIAGNOSIS — I10 Essential (primary) hypertension: Secondary | ICD-10-CM | POA: Diagnosis not present

## 2023-04-22 DIAGNOSIS — N2581 Secondary hyperparathyroidism of renal origin: Secondary | ICD-10-CM | POA: Diagnosis not present

## 2023-04-22 DIAGNOSIS — I5020 Unspecified systolic (congestive) heart failure: Secondary | ICD-10-CM | POA: Diagnosis not present

## 2023-04-22 DIAGNOSIS — I480 Paroxysmal atrial fibrillation: Secondary | ICD-10-CM | POA: Diagnosis not present

## 2023-04-22 DIAGNOSIS — E1139 Type 2 diabetes mellitus with other diabetic ophthalmic complication: Secondary | ICD-10-CM | POA: Diagnosis not present

## 2023-04-22 DIAGNOSIS — N186 End stage renal disease: Secondary | ICD-10-CM | POA: Diagnosis not present

## 2023-04-23 DIAGNOSIS — I4891 Unspecified atrial fibrillation: Secondary | ICD-10-CM | POA: Diagnosis not present

## 2023-04-23 DIAGNOSIS — N186 End stage renal disease: Secondary | ICD-10-CM | POA: Diagnosis not present

## 2023-04-23 DIAGNOSIS — J9600 Acute respiratory failure, unspecified whether with hypoxia or hypercapnia: Secondary | ICD-10-CM | POA: Diagnosis not present

## 2023-04-23 DIAGNOSIS — R260 Ataxic gait: Secondary | ICD-10-CM | POA: Diagnosis not present

## 2023-04-24 DIAGNOSIS — R260 Ataxic gait: Secondary | ICD-10-CM | POA: Diagnosis not present

## 2023-04-24 DIAGNOSIS — I4891 Unspecified atrial fibrillation: Secondary | ICD-10-CM | POA: Diagnosis not present

## 2023-04-24 DIAGNOSIS — J9600 Acute respiratory failure, unspecified whether with hypoxia or hypercapnia: Secondary | ICD-10-CM | POA: Diagnosis not present

## 2023-04-24 DIAGNOSIS — N186 End stage renal disease: Secondary | ICD-10-CM | POA: Diagnosis not present

## 2023-04-25 DIAGNOSIS — I5020 Unspecified systolic (congestive) heart failure: Secondary | ICD-10-CM | POA: Diagnosis not present

## 2023-04-25 DIAGNOSIS — I480 Paroxysmal atrial fibrillation: Secondary | ICD-10-CM | POA: Diagnosis not present

## 2023-04-25 DIAGNOSIS — N186 End stage renal disease: Secondary | ICD-10-CM | POA: Diagnosis not present

## 2023-04-25 DIAGNOSIS — N2581 Secondary hyperparathyroidism of renal origin: Secondary | ICD-10-CM | POA: Diagnosis not present

## 2023-04-25 DIAGNOSIS — E1139 Type 2 diabetes mellitus with other diabetic ophthalmic complication: Secondary | ICD-10-CM | POA: Diagnosis not present

## 2023-04-25 DIAGNOSIS — I10 Essential (primary) hypertension: Secondary | ICD-10-CM | POA: Diagnosis not present

## 2023-04-28 DIAGNOSIS — R531 Weakness: Secondary | ICD-10-CM | POA: Diagnosis not present

## 2023-04-28 DIAGNOSIS — J9600 Acute respiratory failure, unspecified whether with hypoxia or hypercapnia: Secondary | ICD-10-CM | POA: Diagnosis not present

## 2023-04-28 DIAGNOSIS — E236 Other disorders of pituitary gland: Secondary | ICD-10-CM | POA: Diagnosis not present

## 2023-04-29 DIAGNOSIS — N186 End stage renal disease: Secondary | ICD-10-CM | POA: Diagnosis not present

## 2023-05-02 DIAGNOSIS — N186 End stage renal disease: Secondary | ICD-10-CM | POA: Diagnosis not present

## 2023-05-04 DIAGNOSIS — N186 End stage renal disease: Secondary | ICD-10-CM | POA: Diagnosis not present

## 2023-05-06 DIAGNOSIS — N186 End stage renal disease: Secondary | ICD-10-CM | POA: Diagnosis not present

## 2023-05-09 ENCOUNTER — Ambulatory Visit: Payer: BC Managed Care – PPO | Admitting: Medical

## 2023-05-09 DIAGNOSIS — N186 End stage renal disease: Secondary | ICD-10-CM | POA: Diagnosis not present

## 2023-05-11 ENCOUNTER — Ambulatory Visit: Payer: BC Managed Care – PPO | Admitting: Medical

## 2023-05-11 DIAGNOSIS — N186 End stage renal disease: Secondary | ICD-10-CM | POA: Diagnosis not present

## 2023-05-13 DIAGNOSIS — E039 Hypothyroidism, unspecified: Secondary | ICD-10-CM | POA: Diagnosis not present

## 2023-05-13 DIAGNOSIS — I4891 Unspecified atrial fibrillation: Secondary | ICD-10-CM | POA: Diagnosis not present

## 2023-05-13 DIAGNOSIS — G629 Polyneuropathy, unspecified: Secondary | ICD-10-CM | POA: Diagnosis not present

## 2023-05-13 DIAGNOSIS — N186 End stage renal disease: Secondary | ICD-10-CM | POA: Diagnosis not present

## 2023-05-16 DIAGNOSIS — I5023 Acute on chronic systolic (congestive) heart failure: Secondary | ICD-10-CM | POA: Diagnosis not present

## 2023-05-16 DIAGNOSIS — N186 End stage renal disease: Secondary | ICD-10-CM | POA: Diagnosis not present

## 2023-05-16 DIAGNOSIS — I509 Heart failure, unspecified: Secondary | ICD-10-CM | POA: Diagnosis not present

## 2023-05-16 DIAGNOSIS — G629 Polyneuropathy, unspecified: Secondary | ICD-10-CM | POA: Diagnosis not present

## 2023-05-16 DIAGNOSIS — E039 Hypothyroidism, unspecified: Secondary | ICD-10-CM | POA: Diagnosis not present

## 2023-05-16 DIAGNOSIS — J9610 Chronic respiratory failure, unspecified whether with hypoxia or hypercapnia: Secondary | ICD-10-CM | POA: Diagnosis not present

## 2023-05-16 DIAGNOSIS — I4891 Unspecified atrial fibrillation: Secondary | ICD-10-CM | POA: Diagnosis not present

## 2023-05-17 ENCOUNTER — Ambulatory Visit: Payer: BC Managed Care – PPO | Admitting: Family Medicine

## 2023-05-18 ENCOUNTER — Emergency Department (HOSPITAL_COMMUNITY): Payer: BC Managed Care – PPO

## 2023-05-18 ENCOUNTER — Other Ambulatory Visit: Payer: Self-pay

## 2023-05-18 ENCOUNTER — Inpatient Hospital Stay (HOSPITAL_COMMUNITY)
Admission: EM | Admit: 2023-05-18 | Discharge: 2023-06-03 | DRG: 314 | Disposition: A | Discharge status: Rehabilitation Facility | Payer: BC Managed Care – PPO | Attending: Internal Medicine | Admitting: Internal Medicine

## 2023-05-18 ENCOUNTER — Encounter (HOSPITAL_COMMUNITY): Payer: Self-pay

## 2023-05-18 DIAGNOSIS — A419 Sepsis, unspecified organism: Secondary | ICD-10-CM | POA: Diagnosis not present

## 2023-05-18 DIAGNOSIS — I482 Chronic atrial fibrillation, unspecified: Secondary | ICD-10-CM | POA: Diagnosis present

## 2023-05-18 DIAGNOSIS — E875 Hyperkalemia: Secondary | ICD-10-CM | POA: Diagnosis not present

## 2023-05-18 DIAGNOSIS — D631 Anemia in chronic kidney disease: Secondary | ICD-10-CM | POA: Diagnosis not present

## 2023-05-18 DIAGNOSIS — E8779 Other fluid overload: Secondary | ICD-10-CM | POA: Diagnosis not present

## 2023-05-18 DIAGNOSIS — E872 Acidosis, unspecified: Secondary | ICD-10-CM | POA: Diagnosis not present

## 2023-05-18 DIAGNOSIS — T80211A Bloodstream infection due to central venous catheter, initial encounter: Secondary | ICD-10-CM | POA: Diagnosis not present

## 2023-05-18 DIAGNOSIS — L89322 Pressure ulcer of left buttock, stage 2: Secondary | ICD-10-CM | POA: Diagnosis present

## 2023-05-18 DIAGNOSIS — G4733 Obstructive sleep apnea (adult) (pediatric): Secondary | ICD-10-CM | POA: Diagnosis present

## 2023-05-18 DIAGNOSIS — I132 Hypertensive heart and chronic kidney disease with heart failure and with stage 5 chronic kidney disease, or end stage renal disease: Secondary | ICD-10-CM | POA: Diagnosis present

## 2023-05-18 DIAGNOSIS — M21371 Foot drop, right foot: Secondary | ICD-10-CM | POA: Diagnosis present

## 2023-05-18 DIAGNOSIS — Z4682 Encounter for fitting and adjustment of non-vascular catheter: Secondary | ICD-10-CM | POA: Diagnosis not present

## 2023-05-18 DIAGNOSIS — R918 Other nonspecific abnormal finding of lung field: Secondary | ICD-10-CM | POA: Diagnosis not present

## 2023-05-18 DIAGNOSIS — I5082 Biventricular heart failure: Secondary | ICD-10-CM | POA: Diagnosis not present

## 2023-05-18 DIAGNOSIS — I4819 Other persistent atrial fibrillation: Secondary | ICD-10-CM | POA: Diagnosis present

## 2023-05-18 DIAGNOSIS — F419 Anxiety disorder, unspecified: Secondary | ICD-10-CM | POA: Diagnosis not present

## 2023-05-18 DIAGNOSIS — Z7985 Long-term (current) use of injectable non-insulin antidiabetic drugs: Secondary | ICD-10-CM

## 2023-05-18 DIAGNOSIS — B9561 Methicillin susceptible Staphylococcus aureus infection as the cause of diseases classified elsewhere: Secondary | ICD-10-CM | POA: Diagnosis not present

## 2023-05-18 DIAGNOSIS — N186 End stage renal disease: Secondary | ICD-10-CM | POA: Diagnosis not present

## 2023-05-18 DIAGNOSIS — Z87891 Personal history of nicotine dependence: Secondary | ICD-10-CM

## 2023-05-18 DIAGNOSIS — D75829 Heparin-induced thrombocytopenia, unspecified: Secondary | ICD-10-CM | POA: Diagnosis present

## 2023-05-18 DIAGNOSIS — E861 Hypovolemia: Secondary | ICD-10-CM | POA: Diagnosis not present

## 2023-05-18 DIAGNOSIS — R0600 Dyspnea, unspecified: Secondary | ICD-10-CM | POA: Diagnosis not present

## 2023-05-18 DIAGNOSIS — Z8249 Family history of ischemic heart disease and other diseases of the circulatory system: Secondary | ICD-10-CM

## 2023-05-18 DIAGNOSIS — R5381 Other malaise: Secondary | ICD-10-CM | POA: Diagnosis present

## 2023-05-18 DIAGNOSIS — I4891 Unspecified atrial fibrillation: Secondary | ICD-10-CM | POA: Diagnosis not present

## 2023-05-18 DIAGNOSIS — R609 Edema, unspecified: Secondary | ICD-10-CM | POA: Diagnosis not present

## 2023-05-18 DIAGNOSIS — M542 Cervicalgia: Secondary | ICD-10-CM | POA: Diagnosis not present

## 2023-05-18 DIAGNOSIS — Z751 Person awaiting admission to adequate facility elsewhere: Secondary | ICD-10-CM

## 2023-05-18 DIAGNOSIS — R7881 Bacteremia: Secondary | ICD-10-CM

## 2023-05-18 DIAGNOSIS — F54 Psychological and behavioral factors associated with disorders or diseases classified elsewhere: Secondary | ICD-10-CM | POA: Diagnosis not present

## 2023-05-18 DIAGNOSIS — I5020 Unspecified systolic (congestive) heart failure: Secondary | ICD-10-CM

## 2023-05-18 DIAGNOSIS — Z4901 Encounter for fitting and adjustment of extracorporeal dialysis catheter: Secondary | ICD-10-CM | POA: Diagnosis not present

## 2023-05-18 DIAGNOSIS — L89313 Pressure ulcer of right buttock, stage 3: Secondary | ICD-10-CM | POA: Diagnosis not present

## 2023-05-18 DIAGNOSIS — A4101 Sepsis due to Methicillin susceptible Staphylococcus aureus: Secondary | ICD-10-CM | POA: Diagnosis not present

## 2023-05-18 DIAGNOSIS — I953 Hypotension of hemodialysis: Secondary | ICD-10-CM | POA: Diagnosis not present

## 2023-05-18 DIAGNOSIS — I428 Other cardiomyopathies: Secondary | ICD-10-CM | POA: Diagnosis not present

## 2023-05-18 DIAGNOSIS — Z79899 Other long term (current) drug therapy: Secondary | ICD-10-CM

## 2023-05-18 DIAGNOSIS — Z992 Dependence on renal dialysis: Secondary | ICD-10-CM

## 2023-05-18 DIAGNOSIS — Z6841 Body Mass Index (BMI) 40.0 and over, adult: Secondary | ICD-10-CM

## 2023-05-18 DIAGNOSIS — M792 Neuralgia and neuritis, unspecified: Secondary | ICD-10-CM | POA: Diagnosis not present

## 2023-05-18 DIAGNOSIS — G629 Polyneuropathy, unspecified: Secondary | ICD-10-CM | POA: Diagnosis present

## 2023-05-18 DIAGNOSIS — Z888 Allergy status to other drugs, medicaments and biological substances status: Secondary | ICD-10-CM

## 2023-05-18 DIAGNOSIS — Z813 Family history of other psychoactive substance abuse and dependence: Secondary | ICD-10-CM

## 2023-05-18 DIAGNOSIS — I5023 Acute on chronic systolic (congestive) heart failure: Secondary | ICD-10-CM | POA: Diagnosis not present

## 2023-05-18 DIAGNOSIS — Z95828 Presence of other vascular implants and grafts: Secondary | ICD-10-CM

## 2023-05-18 DIAGNOSIS — G9341 Metabolic encephalopathy: Secondary | ICD-10-CM | POA: Diagnosis not present

## 2023-05-18 DIAGNOSIS — R6521 Severe sepsis with septic shock: Secondary | ICD-10-CM | POA: Diagnosis not present

## 2023-05-18 DIAGNOSIS — I10 Essential (primary) hypertension: Secondary | ICD-10-CM | POA: Diagnosis not present

## 2023-05-18 DIAGNOSIS — I480 Paroxysmal atrial fibrillation: Secondary | ICD-10-CM | POA: Diagnosis not present

## 2023-05-18 DIAGNOSIS — J9621 Acute and chronic respiratory failure with hypoxia: Secondary | ICD-10-CM | POA: Diagnosis not present

## 2023-05-18 DIAGNOSIS — I5022 Chronic systolic (congestive) heart failure: Secondary | ICD-10-CM | POA: Diagnosis not present

## 2023-05-18 DIAGNOSIS — I9589 Other hypotension: Secondary | ICD-10-CM | POA: Diagnosis present

## 2023-05-18 DIAGNOSIS — R262 Difficulty in walking, not elsewhere classified: Secondary | ICD-10-CM | POA: Diagnosis present

## 2023-05-18 DIAGNOSIS — E8770 Fluid overload, unspecified: Principal | ICD-10-CM

## 2023-05-18 DIAGNOSIS — R0989 Other specified symptoms and signs involving the circulatory and respiratory systems: Secondary | ICD-10-CM | POA: Diagnosis not present

## 2023-05-18 DIAGNOSIS — G4700 Insomnia, unspecified: Secondary | ICD-10-CM | POA: Diagnosis not present

## 2023-05-18 DIAGNOSIS — Y838 Other surgical procedures as the cause of abnormal reaction of the patient, or of later complication, without mention of misadventure at the time of the procedure: Secondary | ICD-10-CM | POA: Diagnosis present

## 2023-05-18 DIAGNOSIS — K5900 Constipation, unspecified: Secondary | ICD-10-CM | POA: Diagnosis not present

## 2023-05-18 DIAGNOSIS — Z7901 Long term (current) use of anticoagulants: Secondary | ICD-10-CM

## 2023-05-18 DIAGNOSIS — K219 Gastro-esophageal reflux disease without esophagitis: Secondary | ICD-10-CM | POA: Diagnosis present

## 2023-05-18 DIAGNOSIS — L8990 Pressure ulcer of unspecified site, unspecified stage: Secondary | ICD-10-CM | POA: Diagnosis present

## 2023-05-18 DIAGNOSIS — R3129 Other microscopic hematuria: Secondary | ICD-10-CM | POA: Diagnosis not present

## 2023-05-18 DIAGNOSIS — R0602 Shortness of breath: Secondary | ICD-10-CM | POA: Diagnosis not present

## 2023-05-18 DIAGNOSIS — Z8619 Personal history of other infectious and parasitic diseases: Secondary | ICD-10-CM | POA: Diagnosis present

## 2023-05-18 DIAGNOSIS — R0902 Hypoxemia: Secondary | ICD-10-CM | POA: Diagnosis not present

## 2023-05-18 DIAGNOSIS — Z79890 Hormone replacement therapy (postmenopausal): Secondary | ICD-10-CM | POA: Diagnosis not present

## 2023-05-18 DIAGNOSIS — F4323 Adjustment disorder with mixed anxiety and depressed mood: Secondary | ICD-10-CM | POA: Diagnosis not present

## 2023-05-18 DIAGNOSIS — Z5982 Transportation insecurity: Secondary | ICD-10-CM

## 2023-05-18 DIAGNOSIS — I509 Heart failure, unspecified: Secondary | ICD-10-CM

## 2023-05-18 DIAGNOSIS — Z880 Allergy status to penicillin: Secondary | ICD-10-CM

## 2023-05-18 DIAGNOSIS — G5701 Lesion of sciatic nerve, right lower limb: Secondary | ICD-10-CM | POA: Diagnosis not present

## 2023-05-18 DIAGNOSIS — R490 Dysphonia: Secondary | ICD-10-CM | POA: Diagnosis present

## 2023-05-18 DIAGNOSIS — N2581 Secondary hyperparathyroidism of renal origin: Secondary | ICD-10-CM | POA: Diagnosis present

## 2023-05-18 DIAGNOSIS — G6281 Critical illness polyneuropathy: Secondary | ICD-10-CM | POA: Diagnosis not present

## 2023-05-18 DIAGNOSIS — Z955 Presence of coronary angioplasty implant and graft: Secondary | ICD-10-CM

## 2023-05-18 DIAGNOSIS — I959 Hypotension, unspecified: Secondary | ICD-10-CM | POA: Diagnosis not present

## 2023-05-18 LAB — CBC WITH DIFFERENTIAL/PLATELET
Abs Immature Granulocytes: 0.03 10*3/uL (ref 0.00–0.07)
Basophils Absolute: 0.1 10*3/uL (ref 0.0–0.1)
Basophils Relative: 1 %
Eosinophils Absolute: 0.7 10*3/uL — ABNORMAL HIGH (ref 0.0–0.5)
Eosinophils Relative: 9 %
HCT: 30.3 % — ABNORMAL LOW (ref 39.0–52.0)
Hemoglobin: 9.3 g/dL — ABNORMAL LOW (ref 13.0–17.0)
Immature Granulocytes: 0 %
Lymphocytes Relative: 13 %
Lymphs Abs: 0.9 10*3/uL (ref 0.7–4.0)
MCH: 32.3 pg (ref 26.0–34.0)
MCHC: 30.7 g/dL (ref 30.0–36.0)
MCV: 105.2 fL — ABNORMAL HIGH (ref 80.0–100.0)
Monocytes Absolute: 0.8 10*3/uL (ref 0.1–1.0)
Monocytes Relative: 11 %
Neutro Abs: 4.7 10*3/uL (ref 1.7–7.7)
Neutrophils Relative %: 66 %
Platelets: 253 10*3/uL (ref 150–400)
RBC: 2.88 MIL/uL — ABNORMAL LOW (ref 4.22–5.81)
RDW: 15.4 % (ref 11.5–15.5)
WBC: 7.2 10*3/uL (ref 4.0–10.5)
nRBC: 0 % (ref 0.0–0.2)

## 2023-05-18 LAB — COMPREHENSIVE METABOLIC PANEL
ALT: 26 U/L (ref 0–44)
AST: 41 U/L (ref 15–41)
Albumin: 2.8 g/dL — ABNORMAL LOW (ref 3.5–5.0)
Alkaline Phosphatase: 149 U/L — ABNORMAL HIGH (ref 38–126)
Anion gap: 14 (ref 5–15)
BUN: 44 mg/dL — ABNORMAL HIGH (ref 6–20)
CO2: 21 mmol/L — ABNORMAL LOW (ref 22–32)
Calcium: 10.5 mg/dL — ABNORMAL HIGH (ref 8.9–10.3)
Chloride: 102 mmol/L (ref 98–111)
Creatinine, Ser: 13.11 mg/dL — ABNORMAL HIGH (ref 0.61–1.24)
GFR, Estimated: 5 mL/min — ABNORMAL LOW (ref >60)
Glucose, Bld: 84 mg/dL (ref 70–99)
Potassium: 5.5 mmol/L — ABNORMAL HIGH (ref 3.5–5.1)
Sodium: 137 mmol/L (ref 135–145)
Total Bilirubin: 0.7 mg/dL (ref 0.3–1.2)
Total Protein: 6.7 g/dL (ref 6.5–8.1)

## 2023-05-18 MED ORDER — OXYCODONE HCL 5 MG PO TABS
15.0000 mg | ORAL_TABLET | Freq: Once | ORAL | Status: AC
  Administered 2023-05-18: 15 mg via ORAL
  Filled 2023-05-18: qty 3

## 2023-05-18 MED ORDER — OXYCODONE HCL 5 MG PO TABS
15.0000 mg | ORAL_TABLET | Freq: Four times a day (QID) | ORAL | Status: DC | PRN
  Administered 2023-05-18 – 2023-06-01 (×28): 15 mg via ORAL
  Filled 2023-05-18 (×29): qty 3

## 2023-05-18 MED ORDER — APIXABAN 2.5 MG PO TABS
2.5000 mg | ORAL_TABLET | Freq: Two times a day (BID) | ORAL | Status: DC
  Administered 2023-05-18: 2.5 mg via ORAL
  Filled 2023-05-18: qty 1

## 2023-05-18 MED ORDER — MIDODRINE HCL 5 MG PO TABS
30.0000 mg | ORAL_TABLET | Freq: Three times a day (TID) | ORAL | Status: DC
  Administered 2023-05-19 – 2023-06-03 (×34): 30 mg via ORAL
  Filled 2023-05-18 (×42): qty 6

## 2023-05-18 MED ORDER — CHLORHEXIDINE GLUCONATE CLOTH 2 % EX PADS
6.0000 | MEDICATED_PAD | Freq: Every day | CUTANEOUS | Status: DC
  Administered 2023-05-19 – 2023-05-24 (×5): 6 via TOPICAL

## 2023-05-18 MED ORDER — ACETAMINOPHEN 650 MG RE SUPP: 650.0000 mg | Freq: Four times a day (QID) | RECTAL | Status: DC | PRN

## 2023-05-18 MED ORDER — LANTHANUM CARBONATE 500 MG PO CHEW
2000.0000 mg | CHEWABLE_TABLET | Freq: Three times a day (TID) | ORAL | Status: DC
  Administered 2023-05-19 – 2023-05-28 (×9): 2000 mg via ORAL
  Filled 2023-05-18 (×20): qty 4

## 2023-05-18 MED ORDER — GABAPENTIN 300 MG PO CAPS
300.0000 mg | ORAL_CAPSULE | ORAL | Status: DC
  Administered 2023-05-22 – 2023-06-03 (×5): 300 mg via ORAL
  Filled 2023-05-18 (×5): qty 1

## 2023-05-18 MED ORDER — ACETAMINOPHEN 325 MG PO TABS
650.0000 mg | ORAL_TABLET | Freq: Four times a day (QID) | ORAL | Status: DC | PRN
  Administered 2023-05-20 – 2023-05-25 (×3): 650 mg via ORAL
  Filled 2023-05-18 (×4): qty 2

## 2023-05-18 MED ORDER — DIGOXIN 125 MCG PO TABS
0.1250 mg | ORAL_TABLET | ORAL | Status: DC
  Administered 2023-05-20 – 2023-06-03 (×6): 0.125 mg via ORAL
  Filled 2023-05-18 (×8): qty 1

## 2023-05-18 NOTE — Hospital Course (Addendum)
[name] presented to [facility] on 05/18/23 with complaint of leg swelling after missing multiple sessions of dialysis. He misses dialysis due to debility - foot drop, neuropathy, deconditioning - that prevented him from accessing his arranged transportation to dialysis. He was admitted for concern of hypervolemia. This resolved with dialysis and patient was ready for discharge on 10/29 after failure to locate a SNF and patient's preference to return home. On evening of discharge, the patient fell acutely ill, febrile with rigors and tachycardia, and was found to have MSSA Sepsis. Pts HD line was replaced, TTE was negative, TEE was deferred due to comorbidities (hypotension). HD line was replaced on 11/4 and pt resumed dialysis while receiving Ancef x 6 week. Thereafter, efforts were made to sent him to [facility] for inpatient rehabilitation given concerns of high return risk should he be discharged to home. He will be discharged to inpatient physical rehab.   Sepsis MSSA bacteremia Acute fever on 10/29. Initial Vanc/Cefepime transitioned to Ancef. Cardiology consulted to Afib RVR, rec no changes, continue digoxin. Clinical improvement thereafter, no fevers. Proof of cure with repeat blood cultures. HD catheter removed 11/1 and replaced 11/4.  TTE without obvious vegetations, TEE would be best for ruling out endocarditis but risks outweigh benefits for this person. Discharge plan for 6 weeks of IV antibiotic therapy from negative cultures on 05/26/2023 ending 07/06/2023.   Pressure injury of skin Present on L buttocks due to his earlier hospitalization. Wound care consulted. Appears to be healing well, not concerned for infection.   Debility R foot drop Ambulation very limited due to deconditioning, body habitus, neuropathy, foot swelling/pain. Evaluated by PT/OT with recs for SNF, patient refused. Would benefit from OP physical therapy. Pt given a boot.   ESRD Pt attended MWF dialysis in hospital  without complication. Line holiday 11/1 - 11/4.    Chronic HFrEF No active exacerbation while here. LVEF 30 to 35%. Well compensated. Continued digoxin 0.125 mg p.o. 3 times weekly. GDMT limited by chronic hypotension.   Persistent atrial fibrillation Rate controlled. Brief RVR when he became septic, cardiology consulted and followed but no recommended changes. Was stable thereafter. Refractory to cardioversion in past. Amiodarone poor choice given young age and risk to lungs.  Ablation risks outweigh benefits.  Continued anticoagulation with apixaban 5 mg p.o. twice daily.   Chronic hypoxic respiratory failure Stable supplemental O2 requirement. 5L is baseline. Tolerates less when at rest.   Hypotension Chronic, was stable on midodrine 30 mg 3 times daily AC.    Neuropathy Unfortunately has pretty bad neuropathy and foot drop after recent, prolonged hospitalization.  Continued gabapentin, urea, and lidocaine lotions. Consider TCA, duloxetine outpatient.

## 2023-05-18 NOTE — H&P (Incomplete)
Date: 05/19/2023               Patient Name:  Phillip Young MRN: [id number]  DOB: [birth year] Age / Sex: 37 y.o., male   PCP: Marrianne Mood, MD         Medical Service: Internal Medicine Teaching Service         Attending Physician: Dr. Inez Catalina, MD      First Contact: Dr. [name], DO Pager [phone]    Second Contact: Dr. Marrianne Mood, MD Pager [phone]         After Hours (After 5p/  First Contact Pager: [phone]  weekends / holidays): Second Contact Pager: [phone]   SUBJECTIVE   Chief Complaint: missed HD  History of Present Illness: Phillip Young is a 37 yo male with afib, HFrEF, severe obesity, ESRD on HD MWF, and chronic hypoxic respiratory failure on 5 L [place] who presented after missing dialysis x 1 week.  Patient was recently hospitalized on 01/21/2023, and spent 2 months in the hospital with acute hypoxic respiratory failure, congestive heart failure, and A-fib w/ RVR secondary to septic pneumonia, and volume overloaded from end-stage renal disease.  His hospitalization was complicated by ICU stay since he required intubation.He was discharged on 8/28, to [facility], and then to [facility] on 10/[facility]. [facility] was discharged from [facility] on 10/18.  The patient states that he has not been able to get from his home to the transportation vehicle.  Patient last HD was on 10/16 when he was still at [facility], but he was discharged on Friday. He has not been able to ambulate on his own and can hardly stand on his own. He reports loss of strength secondary to his long hospitalization, and stay in the ICU.  Patient's home RN came to his home to help with wound dressing.  He was advised to come to the hospital by his nurse since he had missed dialysis for 1 week.  Otherwise he denies fevers chills cough,chest pain or abdominal pain.  Also denies nausea or vomiting.  He has some mild shortness of breath.  He is out of refill for pain  meds.  At the ED, was noted to be hypoxic while lying flat.   Meds:  Gabapentin 300 mg daily after HD on MWF Eliquis 2.5 mg bid Digoxin 0.125 mg MWF Midodrine 30 mg TID Oxycodone 15 mg q4h PRN Fosrenol 2000 mg TID with meals Melatonin 5 mg at bedtime  Current Meds  Medication Sig   apixaban (ELIQUIS) 2.5 MG TABS tablet Take 2.5 mg by mouth 2 (two) times daily.   Darbepoetin Alfa (ARANESP) 200 MCG/0.4ML SOSY injection Inject 0.4 mLs (200 mcg total) into the skin every Saturday at 6 PM.   digoxin (LANOXIN) 0.125 MG tablet Take 1 tablet (0.125 mg total) by mouth 3 (three) times a week.   gabapentin (NEURONTIN) 300 MG capsule Take 300 mg by mouth daily. Mon., Wed, and Fri.   lanthanum (FOSRENOL) 1000 MG chewable tablet Chew 2,000 mg by mouth 3 (three) times daily with meals.   melatonin 5 MG TABS Take 1 tablet (5 mg total) by mouth at bedtime.   midodrine (PROAMATINE) 10 MG tablet Take 3 tablets (30 mg total) by mouth 3 (three) times daily with meals.   Mouthwashes (MOUTH RINSE) LIQD solution 15 mLs by Mouth Rinse route in the morning, at noon, in the evening, and at bedtime.   oxyCODONE (ROXICODONE) 15 MG immediate release tablet Take 1  tablet (15 mg total) by mouth every 4 (four) hours as needed for severe pain.   [DISCONTINUED] famotidine (PEPCID) 10 MG tablet Take 1 tablet (10 mg total) by mouth daily.    Past Medical History  Past Surgical History:  Procedure Laterality Date   ABSCESS DRAINAGE     AV FISTULA PLACEMENT Left 08/21/2021   Procedure: LEFT ARM ARTERIOVENOUS (AV) FISTULA.;  Surgeon: Nada Libman, MD;  Location: [facility] OR;  Service: Vascular;  Laterality: Left;   CARDIAC CATHETERIZATION     CARDIOVERSION N/A 10/09/2021   Procedure: CARDIOVERSION;  Surgeon: Laurey Morale, MD;  Location: [facility] ENDOSCOPY;  Service: Cardiovascular;  Laterality: N/A;   CARDIOVERSION N/A 05/28/2022   Procedure: CARDIOVERSION;  Surgeon: Laurey Morale, MD;  Location: [facility] ENDOSCOPY;   Service: Cardiovascular;  Laterality: N/A;   CARDIOVERSION N/A 06/07/2022   Procedure: CARDIOVERSION;  Surgeon: Laurey Morale, MD;  Location: [facility] ENDOSCOPY;  Service: Cardiovascular;  Laterality: N/A;   IR FLUORO GUIDE CV LINE RIGHT  03/10/2021   IR FLUORO GUIDE CV LINE RIGHT  04/22/2021   IR FLUORO GUIDE CV LINE RIGHT  08/20/2021   IR FLUORO GUIDE CV LINE RIGHT  03/01/2023   IR FLUORO GUIDE CV LINE RIGHT  03/09/2023   IR PTA VENOUS EXCEPT DIALYSIS CIRCUIT  03/09/2023   IR REMOVE CV FIBRIN SHEATH  03/09/2023   IR US GUIDE VASC ACCESS RIGHT  03/10/2021   IR US GUIDE VASC ACCESS RIGHT  04/22/2021   RIGHT HEART CATH N/A 03/06/2021   Procedure: RIGHT HEART CATH;  Surgeon: Dolores Patty, MD;  Location: [facility] INVASIVE CV LAB;  Service: Cardiovascular;  Laterality: N/A;   RIGHT/LEFT HEART CATH AND CORONARY ANGIOGRAPHY N/A 03/04/2020   Procedure: RIGHT/LEFT HEART CATH AND CORONARY ANGIOGRAPHY;  Surgeon: Laurey Morale, MD;  Location: [facility] INVASIVE CV LAB;  Service: Cardiovascular;  Laterality: N/A;   TEE WITHOUT CARDIOVERSION N/A 05/05/2021   Procedure: TRANSESOPHAGEAL ECHOCARDIOGRAM (TEE);  Surgeon: Laurey Morale, MD;  Location: [facility] ENDOSCOPY;  Service: Cardiovascular;  Laterality: N/A;   TEMPORARY DIALYSIS CATHETER  03/06/2021   Procedure: TEMPORARY DIALYSIS CATHETER;  Surgeon: Dolores Patty, MD;  Location: [facility] INVASIVE CV LAB;  Service: Cardiovascular;;    Social:  Lives With: mother in [place] (to have 24/7 supervision) Level of Function: dependent of ADLs, transfers via hoyer lift/wheelchair  PCP: Dr. Benito Mccreedy Substances: no tobacco since 2018, alcohol, or other substance use   Family History: Reviewed, no pertinent updates  Allergies: Allergies as of 05/18/2023 - Review Complete 05/18/2023  Allergen Reaction Noted   Amiodarone Other (See Comments) 06/01/2022   Coreg [carvedilol] Shortness Of Breath and Diarrhea 07/07/2020   Heparin Other (See Comments) 03/05/2021    Metoprolol Other (See Comments) 08/04/2022   Amoxicillin Other (See Comments) 01/26/2023   Other Swelling and Other (See Comments) 01/26/2023    Review of Systems: A complete ROS was negative except as per HPI.   OBJECTIVE:   Physical Exam: Blood pressure 124/77, pulse 77, temperature 98.7 F (37.1 C), resp. rate 20, height 6' (1.829 m), weight (!) 172.4 kg, SpO2 100%.  Constitutional: chronically ill-appearing, obese male laying in bed, in no acute distress Cardiovascular: regular rate and irregular rhythm, no m/r/g appreciated Pulmonary/Chest: breathing is tachypneic on 5 L [place] (baseline), no wheezing or crackles appreciated. R chest port dressing is clean/dry/intact Abdominal: soft, non-tender, non-distended MSK: normal bulk and tone, 1+ pitting edema bilaterally in feet up to mid shins. Neurological: alert & oriented x 3, speech  is normal although softened since previous intubation Skin: warm and dry Psych: normal mood and affect  Labs: CBC    Component Value Date/Time   WBC 5.5 05/19/2023 0451   RBC 2.52 (L) 05/19/2023 0451   HGB 7.8 (L) 05/19/2023 0451   HCT 25.6 (L) 05/19/2023 0451   PLT 229 05/19/2023 0451   MCV 101.6 (H) 05/19/2023 0451   MCH 31.0 05/19/2023 0451   MCHC 30.5 05/19/2023 0451   RDW 15.5 05/19/2023 0451   LYMPHSABS 0.9 05/18/2023 1729   MONOABS 0.8 05/18/2023 1729   EOSABS 0.7 (H) 05/18/2023 1729   BASOSABS 0.1 05/18/2023 1729     CMP     Component Value Date/Time   NA 135 05/19/2023 0451   NA 135 09/15/2021 1147   K 5.5 (H) 05/19/2023 0451   CL 102 05/19/2023 0451   CO2 21 (L) 05/19/2023 0451   GLUCOSE 85 05/19/2023 0451   BUN 46 (H) 05/19/2023 0451   BUN 41 (H) 09/15/2021 1147   CREATININE 13.56 (H) 05/19/2023 0451   CREATININE 9.17 (H) 08/26/2021 1014   CALCIUM 10.2 05/19/2023 0451   PROT 6.7 05/18/2023 1729   PROT 7.5 09/15/2021 1147   ALBUMIN 2.4 (L) 05/19/2023 0451   ALBUMIN 4.3 09/15/2021 1147   AST 41 05/18/2023 1729   ALT 26  05/18/2023 1729   ALKPHOS 149 (H) 05/18/2023 1729   BILITOT 0.7 05/18/2023 1729   BILITOT 1.1 09/15/2021 1147   GFRNONAA 4 (L) 05/19/2023 0451   GFRAA >60 03/27/2020 1055    Imaging:  EKG: personally reviewed my interpretation is A-fib..  Chest x-ray consistent with pulmonary edema.  ASSESSMENT & PLAN:   Assessment & Plan by Problem: Principal Problem:   Acute on chronic hypoxic respiratory failure ([name]) Active Problems:   ESRD (end stage renal disease) on dialysis ([name])   Congestive heart failure ([name])   Gabriella Vandenbosch is a 37 y.o. man with a hx of afib, HFrEF, severe obesity, ESRD on HD MWF who presented with shortness of breath, and admitted for acute on chronic RF, and volume overloaded due to missed dialysis.  ESRD on HD MWF Patient's last dialysis session was on 10/16, while the patient was still at [facility]. He was discharged to home on Friday, 10/18, but has not been able to attend dialysis since he is not able to get to his transportation.  He has been nonadherent with outpatient dialysis because of leg pain,  swelling and issues with transportation. On BMP, potassium 5.2, BUN 44 creatinine 13.3, Hb 9.3  On my exam,  1+ pitting edema bilaterally in feet up to mid shins. Nephrology consulted, tentative plan for hemodialysis tomorrow 10/24.  He has also consistently required midodrine for HD sessions.   -BMP. -Tentative dialysis scheduled for 10/24. -Midodrine 30 mg 3 times daily for hypotension --Gabapentin 30 mg 3 times daily after HD. -Fosrenol 2000 mg TID with meals -Reach out to social worker to help set up transportation through renal navigator -PT/ OT  Acute on chronic hypoxic respiratory failure Patient wears 5 L [place] at baseline but was having transient hypoxia while in the ED, likely in the setting of missed HD/volume overload. He is sating 98% on 5L [place]. On exam, lungs are clear bilaterally.  Chest x-ray with pulmonary vascular congestion and mild pulmonary  edema.  Suspect this will improve after patient gets dialyzed on 10/24.  -5L [place] -HD as above.  Afib  Stable, Chronic anticoagulated with Eliquis 2.5 mg.  He is asymptomatic.  Refractory to cardioversion x 3 and, and not on amiodarone due to amiodarone toxicity.  -Continue Eliquis 2.5 mg twice daily.  -HFrEF Stable.  He denies chest pain or palpitations.  Last echo done in July with EF 20-25%, severely decreased LV function, LVH, and LV diastolic function unable to be evaluated.  He has been evaluated by cardiology, not a candidate for nodal ablation, ICD or pacemaker.  He is yet to follow-up with cardiology outpatient.  -Digoxin 0.25 3 times weekly.   Chronic stable medical condition  Neuropathic pain - Oxycodone 15 mg.  Diet: Renal VTE: DOAC IVF: None,None Code: Full  Prior to Admission Living Arrangement: Living with his mother. Anticipated Discharge Location: Home Barriers to Discharge: Pending hemodialysis, PT/OT recommendations.   Dispo: Admit patient to Inpatient with expected length of stay greater than 2 midnights.  Signed:  Laretta Bolster, M.D.  Internal Medicine Resident, PGY-1 [facility]  Pager: [phone] 6:50 AM, 05/19/2023   **Please contact the on call pager after 5 pm and on weekends at [phone].**   05/19/2023, 6:50 AM

## 2023-05-18 NOTE — ED Triage Notes (Signed)
Pt bib ems from home c/o leg swelling and pain . Pt was released from hospital this past Friday. Pt last HD tx Wednesday. Pt has been having a hard time getting transportation to HD tx center.   BP 156/86 HR 80 5L [place] 98% Baseline

## 2023-05-18 NOTE — ED Provider Notes (Signed)
[place] EMERGENCY DEPARTMENT AT [facility] Provider Note   CSN: [id number] Arrival date & time: 05/18/23  1451     History  Chief Complaint  Patient presents with   Leg Swelling   Leg Pain    Phillip Young is a 37 y.o. male.  The history is provided by the patient. No language interpreter was used.  Leg Pain Location:  Leg Injury: no   Pain details:    Quality:  Aching   Severity:  Unable to specify   Onset quality:  Unable to specify   Duration: chronic per pt.   Progression:  Worsening Chronicity:  Recurrent Relieved by:  Nothing Ineffective treatments:  None tried Associated symptoms: back pain and swelling (chronic per pt)   Associated symptoms: no fatigue, no fever, no neck pain and no numbness        Home Medications Prior to Admission medications   Medication Sig Start Date End Date Taking? Authorizing Provider  acetaminophen (TYLENOL) 160 MG/5ML solution Take 20.3 mLs (650 mg total) by mouth every 6 (six) hours as needed for mild pain. 03/23/23   [name], DO  acetaminophen (TYLENOL) 325 MG tablet Take 2 tablets (650 mg total) by mouth every 6 (six) hours as needed for fever (give only if temp is >101.4). 03/23/23   [name], DO  albumin human 25 % bottle Inject 100 mLs (25 g total) into the vein Every Tuesday,Thursday,and Saturday with dialysis. 03/24/23   [name], DO  anticoagulant sodium citrate 4 GM/100ML SOLN 5 mLs by Intracatheter route once for 1 dose. 03/23/23 03/23/23  [name], DO  apixaban (ELIQUIS) 5 MG TABS tablet Take 1 tablet (5 mg total) by mouth 2 (two) times daily. 08/18/22   Steffanie Rainwater, MD  bisacodyl (DULCOLAX) 10 MG suppository Place 1 suppository (10 mg total) rectally daily as needed for moderate constipation. 03/23/23   [name], DO  calamine lotion Apply 1 Application topically 2 (two) times daily. 08/13/22   Adron Bene, MD  camphor-menthol [facility]) lotion Apply  topically as needed for itching. 08/13/22   Adron Bene, MD  Chlorhexidine Gluconate Cloth 2 % PADS Apply 6 each topically daily at 6 (six) AM. 03/23/23   [name], DO  cholecalciferol (CHOLECALCIFEROL) 25 MCG tablet Take 1 tablet (1,000 Units total) by mouth daily. 03/23/23   [name], DO  Darbepoetin Alfa (ARANESP) 200 MCG/0.4ML SOSY injection Inject 0.4 mLs (200 mcg total) into the skin every Saturday at 6 PM. 03/26/23   [name], DO  digoxin (LANOXIN) 0.125 MG tablet Take 1 tablet (0.125 mg total) by mouth 3 (three) times a week. 03/23/23   [name], DO  docusate sodium (COLACE) 100 MG capsule Take 1 capsule (100 mg total) by mouth 2 (two) times daily as needed for mild constipation. 03/23/23   [name], DO  famotidine (PEPCID) 10 MG tablet Take 1 tablet (10 mg total) by mouth daily. 03/23/23   [name], DO  gabapentin (NEURONTIN) 100 MG capsule Take 1 capsule (100 mg total) by mouth 3 (three) times daily. 03/23/23   [name], DO  guaiFENesin-dextromethorphan (ROBITUSSIN DM) 100-10 MG/5ML syrup Take 5 mLs by mouth every 4 (four) hours as needed for cough (chest congestion). 03/23/23   [name], DO  haloperidol lactate (HALDOL) 5 MG/ML injection Inject 0.2 mLs (1 mg total) into the vein every 12 (twelve) hours as needed. 03/23/23   [name], DO  hydrocortisone cream 1 % Apply 1 Application topically 3 (  three) times daily as needed for itching (minor skin irritation). 03/23/23   [name], DO  hydrOXYzine (ATARAX) 10 MG tablet Take 1 tablet (10 mg total) by mouth daily as needed for itching. 03/23/23   [name], DO  lanthanum (FOSRENOL) 1000 MG chewable tablet Chew 2,000 mg by mouth 3 (three) times daily with meals.    [provider]  lidocaine (LIDODERM) 5 % Place 1 patch onto the skin daily. Remove & Discard patch within 12 hours or as directed by MD 03/23/23   [name], DO  lidocaine (LMX) 4 % cream Apply topically 4 (four) times daily as needed (for pain or burning). 03/23/23   [name], DO  Lubricants (SURGICAL LUBRICANT) gel Apply topically as needed (to both eyes). 03/23/23   [name], DO  magnesium oxide (MAG-OX) 400 (240 Mg) MG tablet Take 400 mg by mouth 2 (two) times daily. 08/04/22   [provider]  melatonin 5 MG TABS Take 1 tablet (5 mg total) by mouth at bedtime. 06/10/22   Crissie Sickles, MD  midodrine (PROAMATINE) 10 MG tablet Take 3 tablets (30 mg total) by mouth 3 (three) times daily with meals. 03/23/23   [name], DO  Mouthwashes (MOUTH RINSE) LIQD solution 15 mLs by Mouth Rinse route in the morning, at noon, in the evening, and at bedtime. 03/23/23   [name], DO  multivitamin (RENA-VIT) TABS tablet Take 1 tablet by mouth at bedtime. 03/23/23   [name], DO  Nutritional Supplements (,FEEDING SUPPLEMENT, PROSOURCE PLUS) liquid Take 30 mLs by mouth 2 (two) times daily between meals. 03/23/23   [name], DO  oxyCODONE (ROXICODONE) 15 MG immediate release tablet Take 1 tablet (15 mg total) by mouth every 4 (four) hours as needed for severe pain. 03/23/23   [name], DO  polyethylene glycol (MIRALAX / GLYCOLAX) 17 g packet Take 17 g by mouth daily as needed for moderate constipation or severe constipation. 03/23/23   [name], DO  polyvinyl alcohol (LIQUIFILM TEARS) 1.4 % ophthalmic solution Place 1 drop into both eyes as needed for dry eyes. 03/23/23   [name], DO  senna-docusate (SENOKOT-S) 8.6-50 MG tablet Take 1 tablet by mouth at bedtime. 03/23/23   [name], DO  sevelamer carbonate (RENVELA) 800 MG tablet Take 1,600 mg by mouth 3 (three) times daily with meals. 08/04/22   [provider]  sodium zirconium cyclosilicate (LOKELMA) 10 g PACK packet Take 10 g by mouth every Monday, Wednesday, Friday, and Saturday at 6 PM.  03/23/23   Juberg, Calaya Gildner, DO  WEGOVY 0.25 MG/0.5ML SOAJ Inject 0.25 mg into the skin once a week. 01/11/23   [provider]      Allergies    Amiodarone, Coreg [carvedilol], Heparin, Metoprolol, Amoxicillin, and Other    Review of Systems   Review of Systems  Constitutional:  Negative for chills, fatigue and fever.  HENT:  Negative for congestion.   Respiratory:  Negative for cough, chest tightness, shortness of breath and wheezing.   Cardiovascular:  Positive for leg swelling (chronic). Negative for chest pain and palpitations.  Gastrointestinal:  Negative for abdominal pain, constipation, diarrhea, nausea and vomiting.  Genitourinary:  Positive for decreased urine volume. Negative for dysuria.  Musculoskeletal:  Positive for back pain. Negative for neck pain and neck stiffness.  Skin:  Positive for wound (improving). Negative for rash.  Neurological:  Negative for headaches.  Psychiatric/Behavioral:  Negative for agitation.   All other systems reviewed and are negative.  Physical Exam Updated Vital Signs BP (!) 90/49 (BP Location: Right Wrist)   Pulse 93   Temp 98.8 F (37.1 C) (Oral)   Resp 18   Ht 6' (1.829 m)   Wt (!) 172.4 kg   SpO2 100%   BMI 51.54 kg/m  Physical Exam Vitals and nursing note reviewed. Exam conducted with a chaperone present.  Constitutional:      General: He is not in acute distress.    Appearance: He is well-developed. He is not ill-appearing, toxic-appearing or diaphoretic.  HENT:     Head: Normocephalic and atraumatic.     Right Ear: External ear normal.     Left Ear: External ear normal.     Nose: Nose normal.     Mouth/Throat:     Mouth: Mucous membranes are moist.     Pharynx: No oropharyngeal exudate.  Eyes:     Conjunctiva/sclera: Conjunctivae normal.     Pupils: Pupils are equal, round, and reactive to light.  Cardiovascular:     Rate and Rhythm: Tachycardia present.  Pulmonary:     Effort: No respiratory distress.      Breath sounds: No stridor. No wheezing or rhonchi.  Chest:     Chest wall: No tenderness.  Abdominal:     Palpations: Abdomen is soft.     Tenderness: There is no abdominal tenderness. There is no right CVA tenderness, left CVA tenderness, guarding or rebound.  Musculoskeletal:        General: No tenderness or deformity.     Cervical back: Normal range of motion and neck supple. No tenderness.     Right lower leg: Edema present.     Left lower leg: Edema present.  Skin:    General: Skin is warm.     Findings: No erythema or rash.  Neurological:     Mental Status: He is alert and oriented to person, place, and time.     Cranial Nerves: No cranial nerve deficit.     Motor: No abnormal muscle tone.     Coordination: Coordination normal.     Deep Tendon Reflexes: Reflexes normal.     ED Results / Procedures / Treatments   Labs (all labs ordered are listed, but only abnormal results are displayed) Labs Reviewed  CBC WITH DIFFERENTIAL/PLATELET - Abnormal; Notable for the following components:      Result Value   RBC 2.88 (*)    Hemoglobin 9.3 (*)    HCT 30.3 (*)    MCV 105.2 (*)    Eosinophils Absolute 0.7 (*)    All other components within normal limits  COMPREHENSIVE METABOLIC PANEL - Abnormal; Notable for the following components:   Potassium 5.5 (*)    CO2 21 (*)    BUN 44 (*)    Creatinine, Ser 13.11 (*)    Calcium 10.5 (*)    Albumin 2.8 (*)    Alkaline Phosphatase 149 (*)    GFR, Estimated 5 (*)    All other components within normal limits  HEPATITIS B SURFACE ANTIGEN  HEPATITIS B SURFACE ANTIBODY, QUANTITATIVE  CBC  RENAL FUNCTION PANEL    EKG EKG Interpretation Date/Time:  Wednesday May 18 2023 14:56:45 EDT Ventricular Rate:  87 PR Interval:    QRS Duration:  101 QT Interval:  320 QTC Calculation: 385 R Axis:   85  Text Interpretation: Atrial fibrillation Nonspecific repol abnormality, diffuse leads when compared to prior, now appears a fib.  NO STEMI Confirmed by Omaria Plunk, Thayer Ohm ([id number]) on  05/18/2023 3:04:20 PM  Radiology DG Chest Portable 1 View  Result Date: 05/18/2023 CLINICAL DATA:  Hypoxia.  Shortness of breath. EXAM: PORTABLE CHEST 1 VIEW COMPARISON:  March 08, 2023. FINDINGS: Stable cardiomegaly with central pulmonary vascular congestion. Right internal jugular dialysis catheter is noted. Mild bilateral pulmonary edema may be present. Bony thorax is unremarkable. IMPRESSION: Stable cardiomegaly with central pulmonary vascular congestion and possible mild bilateral pulmonary edema. Electronically Signed   By: Lupita Raider M.D.   On: 05/18/2023 18:53    Procedures Procedures    Medications Ordered in ED Medications  Chlorhexidine Gluconate Cloth 2 % PADS 6 each (has no administration in time range)  acetaminophen (TYLENOL) tablet 650 mg (has no administration in time range)    Or  acetaminophen (TYLENOL) suppository 650 mg (has no administration in time range)  apixaban (ELIQUIS) tablet 2.5 mg (2.5 mg Oral Given 05/18/23 2152)  digoxin (LANOXIN) tablet 0.125 mg (has no administration in time range)  gabapentin (NEURONTIN) capsule 300 mg (has no administration in time range)  lanthanum (FOSRENOL) chewable tablet 2,000 mg (has no administration in time range)  midodrine (PROAMATINE) tablet 30 mg (has no administration in time range)  oxyCODONE (Oxy IR/ROXICODONE) immediate release tablet 15 mg (15 mg Oral Given 05/18/23 2306)  oxyCODONE (Oxy IR/ROXICODONE) immediate release tablet 15 mg (15 mg Oral Given 05/18/23 1845)    ED Course/ Medical Decision Making/ A&P                                 Medical Decision Making Amount and/or Complexity of Data Reviewed Labs: ordered. Radiology: ordered.  Risk Prescription drug management. Decision regarding hospitalization.    Halley Elza is a 37 y.o. male with a past medical history significant for atrial fibrillation on Eliquis therapy, GERD, hypertension, CHF,  ESRD on dialysis MWF, and recent admission for hypoxic respiratory failure requiring intubation who presents for missing dialysis 1 week and worsening chronic pain after running out of medications.  He reports he is out of his oxycodone and gabapentin and he is having worsened pain in his back and legs.  He reports this is not new pain.  He reports that he has missed dialysis for the last week because he is having difficulty ambulating from his door to the transporting service.  He reports that he has not been able to get his dialysis and although he is denying significant chest pain or shortness of breath he is on 5 L at baseline and when I was talking with him his oxygen saturations were dropping into the mid 80s.  When he tried to lay flat to look at a wound that is well-healing on his back, his oxygen saturation is dropped again.  Patient thinks needs dialysis tonight and needs to figure out a better way to get his dialysis.  He is also asking for juice and some pain medicine.  Otherwise he denies any fevers, chills, congestion, cough, nausea, vomiting, constipation, diarrhea.  He reports when he gets sick he has less urine and he is not been urinating as much.  Denies dysuria.  Denies other trauma.  On arrival, blood pressure is soft around 90 systolic but has improved over 100 when I was examining him.  His lungs had some rales but no significant rhonchi or wheezing.  Chest is nontender.  Abdomen is nontender.  Patient's wounds were examined on his backside with a chaperone and he has  well-healing wound in his sacrum.  He has edema in his legs that he reports waxes and wanes based on his fluid levels and dialysis.  Denies other complaints on arrival.  Given the missing dialysis for 1 week and his hypoxia here I do suspect he may need dialysis tonight.  Will get some basic labs to look for significant lecture light imbalance and will get chest x-ray due to his hypoxia.  I have low suspicion for  significant flexion in his back as it is well-appearing and not focally tender.  Anticipate reassessment and discussion with nephrology after workup.         5:53 PM Patient still waiting for his labs to complete however I spoke to nephrology and due to the transient hypoxia and missing dialysis for a week and difficulty getting dialysis, I do feel he needs a medicine admission to likely get dialysis in the morning and also sort out what assistance he needs to make sure he can successfully get his needed treatments.  Will call for medical admission after labs are completed.          Final Clinical Impression(s) / ED Diagnoses Final diagnoses:  Hypervolemia, unspecified hypervolemia type      Clinical Impression: 1. Hypervolemia, unspecified hypervolemia type     Disposition: Admit  This note was prepared with assistance of Dragon voice recognition software. Occasional wrong-word or sound-a-like substitutions may have occurred due to the inherent limitations of voice recognition software.     Ayshia Gramlich, Canary Brim, MD 05/18/23 [phone]

## 2023-05-18 NOTE — ED Notes (Signed)
ED TO INPATIENT HANDOFF REPORT  ED Nurse Name and Phone #:  Darral Dash RN [phone]  S Name/Age/Gender Phillip Young 37 y.o. male Room/Bed: 018C/018C  Code Status   Code Status: Full Code  Home/SNF/Other Home Patient oriented to: self, place, time, and situation Is this baseline? Yes   Triage Complete: Triage complete  Chief Complaint Acute on chronic hypoxic respiratory failure ([name]) [J96.21]  Triage Note Pt bib ems from home c/o leg swelling and pain . Pt was released from hospital this past Friday. Pt last HD tx Wednesday. Pt has been having a hard time getting transportation to HD tx center.   BP 156/86 HR 80 5L Nasal Canula 98% Baseline     Allergies 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, SRA positive   Metoprolol Other (See Comments)    near syncope   Amoxicillin Other (See Comments)    Was hospitalized    Other Swelling and Other (See Comments)    Steroids Fluid seeping out of legs     Level of Care/Admitting Diagnosis ED Disposition     ED Disposition  Admit   Condition  --   Comment  Hospital Area: [facility] [[id number]]  Level of Care: Telemetry Medical [104]  May admit patient to [facility] or [facility] if equivalent level of care is available:: No  Covid Evaluation: Asymptomatic - no recent exposure (last 10 days) testing not required  Diagnosis: Acute on chronic hypoxic respiratory failure [facility]) [[id number]]  Admitting Physician: Inez Catalina [phone]  Attending Physician: Inez Catalina [phone]  Certification:: I certify this patient will need inpatient services for at least 2 midnights  Expected Medical Readiness: 05/20/2023          B Medical/Surgery History Past Medical History:  Diagnosis Date   Acute on  chronic respiratory failure with hypoxia ([name]) 04/21/2021   Acute on chronic systolic (congestive) heart failure ([name]) 02/26/2020   Amiodarone toxicity    Anemia    Atrial flutter ([name])    Biventricular congestive heart failure ([name])    Chronic hypoxemic respiratory failure ([name])    Class 3 severe obesity due to excess calories with serious comorbidity and body mass index (BMI) of 50.0 to 59.9 in adult ([name]) 02/26/2020   Essential hypertension 02/26/2020   GERD without esophagitis 02/26/2020   Hidradenitis suppurativa 02/26/2020   NICM (nonischemic cardiomyopathy) ([name])    Obesity hypoventilation syndrome ([name])    OSA (obstructive sleep apnea)    PAF (paroxysmal atrial fibrillation) ([name])    Pneumonia    Prediabetes 02/26/2020   Past Surgical History:  Procedure Laterality Date   ABSCESS DRAINAGE     AV FISTULA PLACEMENT Left 08/21/2021   Procedure: LEFT ARM ARTERIOVENOUS (AV) FISTULA.;  Surgeon: Nada Libman, MD;  Location: [facility] OR;  Service: Vascular;  Laterality: Left;   CARDIAC CATHETERIZATION     CARDIOVERSION N/A 10/09/2021   Procedure: CARDIOVERSION;  Surgeon: Laurey Morale, MD;  Location: [facility] ENDOSCOPY;  Service: Cardiovascular;  Laterality: N/A;   CARDIOVERSION N/A 05/28/2022   Procedure: CARDIOVERSION;  Surgeon: Laurey Morale, MD;  Location: [facility] ENDOSCOPY;  Service: Cardiovascular;  Laterality: N/A;   CARDIOVERSION N/A 06/07/2022   Procedure: CARDIOVERSION;  Surgeon: Laurey Morale, MD;  Location: [facility] ENDOSCOPY;  Service: Cardiovascular;  Laterality: N/A;   IR FLUORO GUIDE CV LINE RIGHT  03/10/2021   IR FLUORO GUIDE CV LINE RIGHT  04/22/2021   IR FLUORO GUIDE CV LINE RIGHT  08/20/2021   IR FLUORO GUIDE CV LINE RIGHT  03/01/2023   IR FLUORO GUIDE CV LINE RIGHT  03/09/2023   IR PTA VENOUS EXCEPT DIALYSIS CIRCUIT  03/09/2023   IR REMOVE CV FIBRIN SHEATH  03/09/2023   IR US GUIDE VASC ACCESS RIGHT  03/10/2021   IR US GUIDE VASC ACCESS RIGHT  04/22/2021   RIGHT HEART  CATH N/A 03/06/2021   Procedure: RIGHT HEART CATH;  Surgeon: Dolores Patty, MD;  Location: [facility] INVASIVE CV LAB;  Service: Cardiovascular;  Laterality: N/A;   RIGHT/LEFT HEART CATH AND CORONARY ANGIOGRAPHY N/A 03/04/2020   Procedure: RIGHT/LEFT HEART CATH AND CORONARY ANGIOGRAPHY;  Surgeon: Laurey Morale, MD;  Location: [facility] INVASIVE CV LAB;  Service: Cardiovascular;  Laterality: N/A;   TEE WITHOUT CARDIOVERSION N/A 05/05/2021   Procedure: TRANSESOPHAGEAL ECHOCARDIOGRAM (TEE);  Surgeon: Laurey Morale, MD;  Location: [facility] ENDOSCOPY;  Service: Cardiovascular;  Laterality: N/A;   TEMPORARY DIALYSIS CATHETER  03/06/2021   Procedure: TEMPORARY DIALYSIS CATHETER;  Surgeon: Dolores Patty, MD;  Location: [facility] INVASIVE CV LAB;  Service: Cardiovascular;;     A IV Location/Drains/Wounds Patient Lines/Drains/Airways Status     Active Line/Drains/Airways     Name Placement date Placement time Site Days   Hemodialysis Catheter Right Internal jugular Double lumen Permanent (Tunneled) 03/09/23  0933  Internal jugular  70   Pressure Injury 02/03/23 Coccyx Mid Stage 2 -  Partial thickness loss of dermis presenting as a shallow open injury with a red, pink wound bed without slough. 02/03/23  0800  -- 104   Pressure Injury 02/08/23 Hip Left;Upper;Lateral;Posterior Deep Tissue Pressure Injury - Purple or maroon localized area of discolored intact skin or blood-filled blister due to damage of underlying soft tissue from pressure and/or shear. Full thickness w 02/08/23  1200  -- 99   Wound / Incision (Open or Dehisced) 02/04/23 Skin tear Hip Left;Posterior small circular skin tears on left hip and back 02/04/23  1345  Hip  103   Wound / Incision (Open or Dehisced) 02/08/23 (IAD) Incontinence Associated Dermatitis Buttocks Right;Left;Posterior Incontinence associated skin damage 02/08/23  0800  Buttocks  99            Intake/Output Last 24 hours No intake or output data in the 24 hours ending  05/18/23 2156  Labs/Imaging Results for orders placed or performed during the hospital encounter of 05/18/23 (from the past 48 hour(s))  CBC with Differential     Status: Abnormal   Collection Time: 05/18/23  5:29 PM  Result Value Ref Range   WBC 7.2 4.0 - 10.5 K/uL   RBC 2.88 (L) 4.22 - 5.81 MIL/uL   Hemoglobin 9.3 (L) 13.0 - 17.0 g/dL   HCT 52.8 (L) 41.3 - 24.4 %   MCV 105.2 (H) 80.0 - 100.0 fL   MCH 32.3 26.0 - 34.0 pg   MCHC 30.7 30.0 - 36.0 g/dL   RDW 01.0 27.2 - 53.6 %   Platelets 253 150 - 400 K/uL   nRBC 0.0 0.0 - 0.2 %   Neutrophils Relative % 66 %   Neutro Abs 4.7 1.7 - 7.7 K/uL   Lymphocytes Relative 13 %   Lymphs Abs 0.9 0.7 - 4.0 K/uL   Monocytes Relative 11 %   Monocytes Absolute 0.8 0.1 - 1.0  K/uL   Eosinophils Relative 9 %   Eosinophils Absolute 0.7 (H) 0.0 - 0.5 K/uL   Basophils Relative 1 %   Basophils Absolute 0.1 0.0 - 0.1 K/uL   Immature Granulocytes 0 %   Abs Immature Granulocytes 0.03 0.00 - 0.07 K/uL    Comment: Performed at [facility], [address] [place], [place] [address]  Comprehensive metabolic panel     Status: Abnormal   Collection Time: 05/18/23  5:29 PM  Result Value Ref Range   Sodium 137 135 - 145 mmol/L   Potassium 5.5 (H) 3.5 - 5.1 mmol/L   Chloride 102 98 - 111 mmol/L   CO2 21 (L) 22 - 32 mmol/L   Glucose, Bld 84 70 - 99 mg/dL    Comment: Glucose reference range applies only to samples taken after fasting for at least 8 hours.   BUN 44 (H) 6 - 20 mg/dL   Creatinine, Ser 62.70 (H) 0.61 - 1.24 mg/dL   Calcium 35.0 (H) 8.9 - 10.3 mg/dL   Total Protein 6.7 6.5 - 8.1 g/dL   Albumin 2.8 (L) 3.5 - 5.0 g/dL   AST 41 15 - 41 U/L   ALT 26 0 - 44 U/L   Alkaline Phosphatase 149 (H) 38 - 126 U/L   Total Bilirubin 0.7 0.3 - 1.2 mg/dL   GFR, Estimated 5 (L) >60 mL/min    Comment: (NOTE) Calculated using the CKD-EPI Creatinine Equation (2021)    Anion gap 14 5 - 15    Comment: Performed at [facility], [address]  [place], [place] [address]   DG Chest Portable 1 View  Result Date: 05/18/2023 CLINICAL DATA:  Hypoxia.  Shortness of breath. EXAM: PORTABLE CHEST 1 VIEW COMPARISON:  March 08, 2023. FINDINGS: Stable cardiomegaly with central pulmonary vascular congestion. Right internal jugular dialysis catheter is noted. Mild bilateral pulmonary edema may be present. Bony thorax is unremarkable. IMPRESSION: Stable cardiomegaly with central pulmonary vascular congestion and possible mild bilateral pulmonary edema. Electronically Signed   By: Lupita Raider M.D.   On: 05/18/2023 18:53    Pending Labs Unresulted Labs (From admission, onward)     Start     Ordered   05/19/23 0500  CBC  Tomorrow morning,   R        05/18/23 2104   05/19/23 0500  Renal function panel  Tomorrow morning,   R        05/18/23 2104   05/18/23 1911  Hepatitis B surface antigen  (New Admission Hemo Labs (Hepatitis B))  Once,   URGENT        05/18/23 1911   05/18/23 1911  Hepatitis B surface antibody,quantitative  (New Admission Hemo Labs (Hepatitis B))  Once,   URGENT        05/18/23 1911   Signed and Held  Renal function panel  Once,   R        Signed and Held   Signed and Held  CBC  Once,   R        Signed and Held            Vitals/Pain Today's Vitals   05/18/23 1900 05/18/23 1915 05/18/23 1930 05/18/23 2059  BP: 99/70 123/65 124/77   Pulse: 79 80 77   Resp: (!) 24 18 20    Temp: 98.7 F (37.1 C)     TempSrc:      SpO2: 100% 100% 100%   Weight:  Height:      PainSc:    5     Isolation Precautions No active isolations  Medications Medications  Chlorhexidine Gluconate Cloth 2 % PADS 6 each (has no administration in time range)  acetaminophen (TYLENOL) tablet 650 mg (has no administration in time range)    Or  acetaminophen (TYLENOL) suppository 650 mg (has no administration in time range)  apixaban (ELIQUIS) tablet 2.5 mg (2.5 mg Oral Given 05/18/23 2152)  digoxin (LANOXIN) tablet 0.125 mg (has no  administration in time range)  gabapentin (NEURONTIN) capsule 300 mg (has no administration in time range)  lanthanum (FOSRENOL) chewable tablet 2,000 mg (has no administration in time range)  midodrine (PROAMATINE) tablet 30 mg (has no administration in time range)  oxyCODONE (Oxy IR/ROXICODONE) immediate release tablet 15 mg (has no administration in time range)  oxyCODONE (Oxy IR/ROXICODONE) immediate release tablet 15 mg (15 mg Oral Given 05/18/23 1845)    Mobility walks     Focused Assessments    R Recommendations: See Admitting Provider Note  Report given to:   Additional Notes:

## 2023-05-18 NOTE — Progress Notes (Signed)
Brief nephrology note  37 year old male ESRD on HD, chronic hypoxic respiratory failure, nonadherence with outpatient dialysis treatment presented because of leg swelling and pain.  Patient said he missed treatment for a week because of transportation issue and unable to get out of the house. He is currently requiring around 5 L of oxygen which is his home dose.  The labs showed potassium level 5.2, BUN 44, creatinine 13.11, hemoglobin 9.3.  Chest x-ray with pulmonary vascular congestion and mild pulmonary edema.  I have personally seen and examined the patient.  He was lying on bed comfortable.  Asking for pain medication.  Plan: He will need social worker consult to address transportation issue.  We we will communicate with the renal navigator as well. Being admitted for observation. We will arrange for dialysis tomorrow morning. Discussed with the ER provider.  Crista Elliot, MD [facility].

## 2023-05-19 ENCOUNTER — Telehealth (HOSPITAL_COMMUNITY): Payer: Self-pay

## 2023-05-19 DIAGNOSIS — J9621 Acute and chronic respiratory failure with hypoxia: Secondary | ICD-10-CM

## 2023-05-19 LAB — RENAL FUNCTION PANEL
Albumin: 2.4 g/dL — ABNORMAL LOW (ref 3.5–5.0)
Anion gap: 12 (ref 5–15)
BUN: 46 mg/dL — ABNORMAL HIGH (ref 6–20)
CO2: 21 mmol/L — ABNORMAL LOW (ref 22–32)
Calcium: 10.2 mg/dL (ref 8.9–10.3)
Chloride: 102 mmol/L (ref 98–111)
Creatinine, Ser: 13.56 mg/dL — ABNORMAL HIGH (ref 0.61–1.24)
GFR, Estimated: 4 mL/min — ABNORMAL LOW (ref >60)
Glucose, Bld: 85 mg/dL (ref 70–99)
Phosphorus: 6 mg/dL — ABNORMAL HIGH (ref 2.5–4.6)
Potassium: 5.5 mmol/L — ABNORMAL HIGH (ref 3.5–5.1)
Sodium: 135 mmol/L (ref 135–145)

## 2023-05-19 LAB — CBC
HCT: 25.6 % — ABNORMAL LOW (ref 39.0–52.0)
Hemoglobin: 7.8 g/dL — ABNORMAL LOW (ref 13.0–17.0)
MCH: 31 pg (ref 26.0–34.0)
MCHC: 30.5 g/dL (ref 30.0–36.0)
MCV: 101.6 fL — ABNORMAL HIGH (ref 80.0–100.0)
Platelets: 229 10*3/uL (ref 150–400)
RBC: 2.52 MIL/uL — ABNORMAL LOW (ref 4.22–5.81)
RDW: 15.5 % (ref 11.5–15.5)
WBC: 5.5 10*3/uL (ref 4.0–10.5)
nRBC: 0 % (ref 0.0–0.2)

## 2023-05-19 LAB — IRON AND TIBC
Iron: 64 ug/dL (ref 45–182)
Saturation Ratios: 30 % (ref 17.9–39.5)
TIBC: 211 ug/dL — ABNORMAL LOW (ref 250–450)
UIBC: 147 ug/dL

## 2023-05-19 LAB — DIGOXIN LEVEL: Digoxin Level: 1 ng/mL (ref 0.8–2.0)

## 2023-05-19 LAB — HEPATITIS B SURFACE ANTIGEN: Hepatitis B Surface Ag: NONREACTIVE

## 2023-05-19 LAB — FERRITIN: Ferritin: 359 ng/mL — ABNORMAL HIGH (ref 24–336)

## 2023-05-19 MED ORDER — MEDIHONEY WOUND/BURN DRESSING EX PSTE
1.0000 | PASTE | Freq: Every day | CUTANEOUS | Status: DC
  Administered 2023-05-19 – 2023-06-02 (×12): 1 via TOPICAL
  Filled 2023-05-19 (×2): qty 44

## 2023-05-19 MED ORDER — LEVOTHYROXINE SODIUM 25 MCG PO TABS
25.0000 ug | ORAL_TABLET | Freq: Every day | ORAL | Status: DC
  Administered 2023-05-20 – 2023-06-03 (×13): 25 ug via ORAL
  Filled 2023-05-19 (×15): qty 1

## 2023-05-19 MED ORDER — UREA 10 % EX LOTN
TOPICAL_LOTION | Freq: Every day | CUTANEOUS | Status: DC
  Filled 2023-05-19: qty 237

## 2023-05-19 MED ORDER — DAKINS (1/4 STRENGTH) 0.125 % EX SOLN
Freq: Every day | CUTANEOUS | Status: AC
  Filled 2023-05-19 (×2): qty 473

## 2023-05-19 MED ORDER — APIXABAN 5 MG PO TABS
5.0000 mg | ORAL_TABLET | Freq: Two times a day (BID) | ORAL | Status: DC
  Administered 2023-05-19 – 2023-05-25 (×12): 5 mg via ORAL
  Filled 2023-05-19 (×14): qty 1

## 2023-05-19 MED ORDER — CAPSAICIN 0.025 % EX CREA
TOPICAL_CREAM | Freq: Every day | CUTANEOUS | Status: DC
  Filled 2023-05-19: qty 60

## 2023-05-19 MED ORDER — ONDANSETRON HCL 4 MG PO TABS
4.0000 mg | ORAL_TABLET | Freq: Three times a day (TID) | ORAL | Status: AC | PRN
  Administered 2023-05-19 – 2023-05-20 (×3): 4 mg via ORAL
  Filled 2023-05-19 (×3): qty 1

## 2023-05-19 MED ORDER — GERHARDT'S BUTT CREAM
TOPICAL_CREAM | Freq: Three times a day (TID) | CUTANEOUS | Status: DC
  Filled 2023-05-19 (×4): qty 1

## 2023-05-19 MED ORDER — HEPARIN SODIUM (PORCINE) 1000 UNIT/ML IJ SOLN
INTRAMUSCULAR | Status: AC
  Filled 2023-05-19: qty 4

## 2023-05-19 NOTE — Consult Note (Addendum)
WOC Nurse Consult Note: patient familiar to WOC team from previous admission  Reason for Consult: buttocks wounds  Wound type: 1. Full thickness low mid back  2.  Stage 3 R buttocks, R posterior thigh  3.  Stage 2 lower B buttocks, L hip  4. Intertriginous dermatitis underneath pannus ICD-10 CM Codes for Irritant Dermatitis   L30.4  - Erythema intertrigo. Also used for abrasion of the hand, chafing of the skin, dermatitis due to sweating and friction, friction dermatitis, friction eczema, and genital/thigh intertrigo.  Pressure Injury POA: Yes Measurement: 1.  Full thickness low mid back 0.5 cm x 0.5 cm x 3 cm unable to visualize wound bed due to depth; what can be seen is pink and moist.  2.  Stage 3 R buttock 2 separate small ulcerations (0.5 cm x 0.5 cm) 75% yellow 25% pink moist; R posterior upper thigh 3 separate areas largest 0.5 cm x 2 cm 75% yellow 25% pink moist  3.  Scattered Stage 2 pink and dry to B lower buttocks and L hip  4.  Mild erythema and partial thickness skin loss underneath pannus L greater than R  Wound bed: as above  Drainage (amount, consistency, odor) minimal tan exudate to R buttocks, R posterior thigh  Periwound: peeling healing skin from previous moisture damage  Dressing procedure/placement/frequency:  Clean R buttocks and R posterior thigh ulcers with NS, apply Medihoney to wound beds daily, cover with dry gauze and silicone foam.  Clean low mid back wound with NS, using a Q tip applicator pack wound with  Dakin's moistened gauze daily, cover with dry gauze and silicone foam or ABD pad whichever is preferred.  Apply Gerhardt's Butt Cream to buttocks, upper thighs, groin 3 times a day and prn soiling.  L hip healing Stage 2 cover with silicone foam. Lift foam daily to assess.  Change foam q3 days and prn soiling.  Clean underneath pannus with soap and water, dry and apply Order [facility] [place] # [phone] Measure and cut length of InterDry to fit in skin folds that have skin  breakdown Tuck InterDry fabric into skin folds in a single layer, allow for 2 inches of overhang from skin edges to allow for wicking to occur May remove to bathe; dry area thoroughly and then tuck into affected areas again Do not apply any creams or ointments when using InterDry DO NOT THROW AWAY FOR 5 DAYS unless soiled with stool DO NOT [facility] product, this will inactivate the silver in the material  New sheet of Interdry should be applied after 5 days of use if patient continues to have skin breakdown     Long conversation with patient about the above wound care to which he agrees.  Patient defers a bariatric low air loss bed at this time.   I appreciate the bedside nurse assisting in this wound care consult and she is aware of POC.   WOC team will not follow. Re-consult if further needs arise.   Thank you,    Priscella Mann MSN, RN-BC, [employer] [phone]

## 2023-05-19 NOTE — Progress Notes (Signed)
Pt receives out-pt HD at [facility]) on MWF 11:55 am chair time. Contacted by clinic social worker who reports that pt has been set-up with transportation with [facility] (pt currently living with sister in [place]) but pt reports that he does not have anyone that can assist getting pt from apartment to transportation. [facility] staff are unable to leave [facility]ena[facility] Niece in order to get pt from apartment per clinic social worker. This information was provided and discussed with RN CM. Advised that there are no known options for door to door transportation services. Will assist as needed.   Olivia Canter Renal Navigator [phone]

## 2023-05-19 NOTE — Progress Notes (Signed)
   Inpatient Rehab Admissions Coordinator :  Per OT therapy recommendations patient was screened for CIR candidacy by Ottie Glazier RN MSN. PT recs SNF. Patient is not at a level to tolerate the intensity required to pursue a CIR admit. The CIR admissions team will follow and monitor for progress and place a Rehab Consult order if felt to be appropriate. Other rehab venues should be pursued. Please contact me with any questions.  Ottie Glazier RN MSN Admissions Coordinator [phone]

## 2023-05-19 NOTE — Consult Note (Signed)
ESRD Consult Note  Reason for consult: ESRD, provision of dialysis  Assessment/Recommendations:  ESRD  -outpatient HD orders: [facility]. MWF. 4.5hrs. EDW 172KG. TDC. Flow rates: 400/800. 2k/2cal. Heparin: none. Meds: none yet -HD today, back on MWF schedule tomorrow if he's still here -will SW assistance to address transportation issues  Acute on chronic respiratory failure HFrEF -with pulm vasc congestion on CXR. UF as tolerated  Volume/ chronic hypotension  -UF as tolerated. Midodrine with HD  Anemia of Chronic Kidney Disease Hemoglobin 7.8. will check Fe panel.  -Transfuse PRN for Hgb <7  Secondary Hyperparathyroidism/Hyperphosphatemia - resume home binders, follow PO4   Hyperkalemia -will attempt to manage with HD. 2k bath  Anthony Sar, MD [facility]  History of Present Illness: Phillip Young is a/an 37 y.o. male with a past medical history of ESRD, HFrEF, obesity, chronic resp failure on 5L home o2 who presents to the hospital due to missed HD. [facility] then to [facility] 10/18. Has not gone to dialysis this week due to transportation issues. Has been weak since discharge. Patient seen and examined bedside.  He reports stable shortness of breath (chronic issue).  No other complaints today.  Denies any chest pain, issues with his catheter.  He does report that he has been receiving normal saline caps when he was going to HD in [facility].   Medications:  Current Facility-Administered Medications  Medication Dose Route Frequency Provider Last Rate Last Admin   acetaminophen (TYLENOL) tablet 650 mg  650 mg Oral Q6H PRN Atway, Rayann N, DO       Or   acetaminophen (TYLENOL) suppository 650 mg  650 mg Rectal Q6H PRN Atway, Rayann N, DO       apixaban (ELIQUIS) tablet 5 mg  5 mg Oral BID Juberg, Christopher, DO       Chlorhexidine Gluconate Cloth 2 % PADS 6 each  6 each Topical Q0600 Maxie Barb, MD   6 each at 05/19/23  0505   [START ON 05/20/2023] digoxin (LANOXIN) tablet 0.125 mg  0.125 mg Oral Once per day on Monday Wednesday Friday Atway, Rayann N, DO       [START ON 05/20/2023] gabapentin (NEURONTIN) capsule 300 mg  300 mg Oral Q M,W,F-HD Atway, Rayann N, DO       Gerhardt's butt cream   Topical TID Inez Catalina, MD       lanthanum (FOSRENOL) chewable tablet 2,000 mg  2,000 mg Oral TID WC Atway, Rayann N, DO       leptospermum manuka honey (MEDIHONEY) paste 1 Application  1 Application Topical Daily Inez Catalina, MD       [START ON 05/20/2023] levothyroxine (SYNTHROID) tablet 25 mcg  25 mcg Oral Q0600 [name], DO       midodrine (PROAMATINE) tablet 30 mg  30 mg Oral TID WC Atway, Rayann N, DO       oxyCODONE (Oxy IR/ROXICODONE) immediate release tablet 15 mg  15 mg Oral Q6H PRN Atway, Rayann N, DO   15 mg at 05/19/23 0505   sodium hypochlorite (DAKIN'S 1/4 STRENGTH) topical solution   Irrigation Daily Inez Catalina, MD         ALLERGIES Amiodarone, Coreg [carvedilol], Heparin, Metoprolol, Amoxicillin, and Other  MEDICAL HISTORY Past Medical History:  Diagnosis Date   Acute on chronic respiratory failure with hypoxia ([name]) 04/21/2021   Acute on chronic systolic (congestive) heart failure ([name]) 02/26/2020   Amiodarone toxicity  Anemia    Atrial flutter ([name])    Biventricular congestive heart failure ([name])    Chronic hypoxemic respiratory failure ([name])    Class 3 severe obesity due to excess calories with serious comorbidity and body mass index (BMI) of 50.0 to 59.9 in adult [facility]) 02/26/2020   Essential hypertension 02/26/2020   GERD without esophagitis 02/26/2020   Hidradenitis suppurativa 02/26/2020   NICM (nonischemic cardiomyopathy) ([name])    Obesity hypoventilation syndrome ([name])    OSA (obstructive sleep apnea)    PAF (paroxysmal atrial fibrillation) ([name])    Pneumonia    Prediabetes 02/26/2020     SOCIAL HISTORY Social History   Socioeconomic History   Marital  status: Single    Spouse name: Not on file   Number of children: Not on file   Years of education: Not on file   Highest education level: Not on file  Occupational History   Not on file  Tobacco Use   Smoking status: Former    Current packs/day: 0.00    Types: Cigarettes    Quit date: 2019    Years since quitting: 5.8    Passive exposure: Current   Smokeless tobacco: Former   Tobacco comments:    quit in 2019  Vaping Use   Vaping status: Never Used  Substance and Sexual Activity   Alcohol use: Never   Drug use: Never   Sexual activity: Not on file  Other Topics Concern   Not on file  Social History Narrative   Not on file   Social Determinants of Health   Financial Resource Strain: Low Risk  (03/24/2023)   Received from [facility]   Overall Financial Resource Strain (CARDIA)    Difficulty of Paying Living Expenses: Not very hard  Food Insecurity: No Food Insecurity (05/18/2023)   Hunger Vital Sign    Worried About Running Out of Food in the Last Year: Never true    Ran Out of Food in the Last Year: Never true  Transportation Needs: No Transportation Needs (05/18/2023)   PRAPARE - Administrator, Civil Service (Medical): No    Lack of Transportation (Non-Medical): No  Physical Activity: Inactive (09/21/2022)   Exercise Vital Sign    Days of Exercise per Week: 0 days    Minutes of Exercise per Session: 0 min  Stress: No Stress Concern Present (04/21/2023)   Received from [facility]   [employer] of Occupational Health - Occupational Stress Questionnaire    Feeling of Stress : Only a little  Social Connections: Moderately Isolated (03/24/2023)   Received from [facility]   Social Connection and Isolation Panel [NHANES]    Frequency of Communication with Friends and Family: More than three times a week    Frequency of Social Gatherings with Friends and Family: More than three times a week    Attends Religious Services: 1 to 4 times per year     Active Member of [employer] or Organizations: No    Attends Banker Meetings: Never    Marital Status: Never married  Intimate Partner Violence: Not At Risk (05/18/2023)   Humiliation, Afraid, Rape, and Kick questionnaire    Fear of Current or Ex-Partner: No    Emotionally Abused: No    Physically Abused: No    Sexually Abused: No     FAMILY HISTORY Family History  Problem Relation Age of Onset   Heart disease Mother    Hypertension Mother    Pulmonary Hypertension Mother  Drug abuse Father        died due to Heroin overdose     Review of Systems: 12 systems were reviewed and negative except per HPI  Physical Exam: Vitals:   05/18/23 2222 05/19/23 0547  BP: 130/63 106/65  Pulse: 81 (!) 108  Resp: 17 18  Temp: 98.2 F (36.8 C) 98.5 F (36.9 C)  SpO2: 98% 99%   No intake/output data recorded.  Intake/Output Summary (Last 24 hours) at 05/19/2023 1011 Last data filed at 05/19/2023 0702 Gross per 24 hour  Intake --  Output 0 ml  Net 0 ml   General: chronically ill-appearing, no acute distress HEENT: anicteric sclera, MMM CV: irreg irreg, no murmurs Lungs: decreased breath sounds diffusely (limited by body habitus), bilateral chest rise Abd: obese, soft, non-tender, non-distended Skin: no visible lesions or rashes Ext: 1+ pitting edema b/l LEs Psych: alert, engaged, appropriate mood and affect Neuro: normal speech, no gross focal deficits  Dialysis access: RIJ TDC c/d/I (yellow caps)  Test Results Reviewed Lab Results  Component Value Date   NA 135 05/19/2023   K 5.5 (H) 05/19/2023   CL 102 05/19/2023   CO2 21 (L) 05/19/2023   BUN 46 (H) 05/19/2023   CREATININE 13.56 (H) 05/19/2023   CALCIUM 10.2 05/19/2023   ALBUMIN 2.4 (L) 05/19/2023   PHOS 6.0 (H) 05/19/2023    I have reviewed relevant outside healthcare records

## 2023-05-19 NOTE — Progress Notes (Signed)
   05/19/23 1558  Vitals  Temp 98.4 F (36.9 C)  Pulse Rate 92  Resp 17  BP 130/68  SpO2 97 %  O2 Device Nasal Cannula  Weight (!) 173.5 kg  Type of Weight Post-Dialysis  Oxygen Therapy  O2 Flow Rate (L/min) 4 L/min  Post Treatment  Dialyzer Clearance Lightly streaked  Hemodialysis Intake (mL) 0 mL  Liters Processed 73.5  Fluid Removed (mL) 4000 mL  Tolerated HD Treatment Yes   Received patient in bed to unit.  Alert and oriented.  Informed consent signed and in chart.   TX duration:3.5hrs  Patient tolerated well.  Transported back to the room  Alert, without acute distress.  Hand-off given to patient's nurse.   Access used: [facility] Access issues: none  Total UF removed: 4L Medication(s) given: none   [facility]

## 2023-05-19 NOTE — TOC Progression Note (Signed)
Transition of Care [facility]) - Progression Note    Patient Details  Name: Phillip Young MRN: [id number] Date of Birth: Jul 31, 1985  Transition of Care [facility]) CM/SW Contact  Konrad Hoak [place], [place] Phone Number: 05/19/2023, 2:19 PM  Clinical Narrative:     CSW made referral to financial navigator to be screened for medicaid. CSW acknowledges SNF rec. Will follow up with pt when he is available in room.           Social Determinants of Health (SDOH) Interventions SDOH Screenings   Food Insecurity: No Food Insecurity (05/18/2023)  Housing: Low Risk  (05/18/2023)  Transportation Needs: No Transportation Needs (05/18/2023)  Utilities: Not At Risk (05/18/2023)  Alcohol Screen: Low Risk  (09/21/2022)  Depression (PHQ2-9): High Risk (09/21/2022)  Financial Resource Strain: Low Risk  (03/24/2023)   Received from [facility]  Physical Activity: Inactive (09/21/2022)  Social Connections: Moderately Isolated (03/24/2023)   Received from [facility]  Stress: No Stress Concern Present (04/21/2023)   Received from [facility]  Tobacco Use: Medium Risk (05/18/2023)    Readmission Risk Interventions    03/23/2023    3:48 PM 06/09/2022   12:19 PM 05/12/2021   10:35 AM  Readmission Risk Prevention Plan  Transportation Screening Complete Complete Complete  Medication Review Oceanographer) Complete Complete Complete  PCP or Specialist appointment within 3-5 days of discharge -- Complete Complete  HRI or Home Care Consult Complete Complete Complete  SW Recovery Care/Counseling Consult Complete Complete Complete  Palliative Care Screening Not Applicable Not Applicable Complete  Skilled Nursing Facility Not Applicable Not Applicable Complete

## 2023-05-19 NOTE — Progress Notes (Signed)
HD#1 SUBJECTIVE:  Patient Summary: Phillip Young is a 37 y.o. with a pertinent PMH of ESRD on MWF HD, HFrEF, chronic hypoxic respiratory failure on 5L [place], Afib on Eliquis who presented with missed dialysis and dyspnea and admitted for acute on chronic hypoxic respiratory failure.   Overnight Events: None.  Interim History: Pt is well this morning. He is concerned about his neuropathy and R foot drop. He feels dyspneic laying flat in the bed. He wanted [place] to look at his healing ulcers.  OBJECTIVE:  Vital Signs: Vitals:   05/18/23 2222 05/19/23 0547 05/19/23 1056 05/19/23 1122  BP: 130/63 106/65 (!) 116/57 124/67  Pulse: 81 (!) 108 81 83  Resp: 17 18 18 17   Temp: 98.2 F (36.8 C) 98.5 F (36.9 C) 98.5 F (36.9 C) 98.1 F (36.7 C)  TempSrc:      SpO2: 98% 99% 98% 98%  Weight:    (!) 177.5 kg  Height:       Supplemental O2: Nasal Cannula SpO2: 98 % O2 Flow Rate (L/min): 4 L/min  Filed Weights   05/18/23 1454 05/19/23 1122  Weight: (!) 172.4 kg (!) 177.5 kg     Intake/Output Summary (Last 24 hours) at 05/19/2023 1133 Last data filed at 05/19/2023 0702 Gross per 24 hour  Intake --  Output 0 ml  Net 0 ml   Net IO Since Admission: 0 mL [05/19/23 1133]  Physical Exam: Physical Exam Constitutional:      General: He is not in acute distress.    Appearance: Normal appearance. He is not ill-appearing.  HENT:     Head: Normocephalic and atraumatic.  Cardiovascular:     Rate and Rhythm: Normal rate and regular rhythm.     Pulses: Normal pulses.  Pulmonary:     Effort: Pulmonary effort is normal. No respiratory distress.     Breath sounds: Rales present. No wheezing.  Abdominal:     General: Abdomen is flat.     Palpations: Abdomen is soft.  Musculoskeletal:     Right lower leg: No edema.     Left lower leg: No edema.  Skin:    General: Skin is warm and dry.     Capillary Refill: Capillary refill takes less than 2 seconds.  Neurological:     General: No focal  deficit present.     Mental Status: He is alert and oriented to person, place, and time.  Psychiatric:        Mood and Affect: Mood normal.        Behavior: Behavior normal.     Patient Lines/Drains/Airways Status     Active Line/Drains/Airways     Name Placement date Placement time Site Days   Hemodialysis Catheter Right Internal jugular Double lumen Permanent (Tunneled) 03/09/23  0933  Internal jugular  71   Pressure Injury 02/03/23 Coccyx Mid Stage 2 -  Partial thickness loss of dermis presenting as a shallow open injury with a red, pink wound bed without slough. 02/03/23  0800  -- 105   Pressure Injury 05/18/23 Buttocks Right Stage 3 -  Full thickness tissue loss. Subcutaneous fat may be visible but bone, tendon or muscle are NOT exposed. 05/18/23  2300  -- 1   Pressure Injury 05/18/23 Buttocks Left Stage 2 -  Partial thickness loss of dermis presenting as a shallow open injury with a red, pink wound bed without slough. 05/18/23  2300  -- 1  ASSESSMENT/PLAN:  Assessment: Principal Problem:   Acute on chronic hypoxic respiratory failure ([name]) Active Problems:   ESRD (end stage renal disease) on dialysis ([name])   Congestive heart failure ([name])  Phillip Young is a 37 y.o. man with a pertinent PMH of ESRD on MWF HD, HFrEF, chronic hypoxic respiratory failure on 5L [place], Afib on Eliquis who presented with shortness of breath, and admitted for acute on chronic RF and volume overload due to missed dialysis.   ESRD on HD MWF Patient's last dialysis session was on 10/16, while the patient was still at [facility]. He was discharged to home on Friday, 10/18, but has not been able to attend dialysis since he is not able to get to his transportation - he cannot ambulate or safely get himself into a car.   - Nephrology consulted, plan for hemodialysis today 10/24.  He has consistently required midodrine for HD sessions.  - Midodrine 30 mg 3 times daily for hypotension -  Gabapentin 300 mg 3 times daily after HD. - Fosrenol 2000 mg TID with meals - Reach out to social worker to help set up transportation through renal navigator - PT/ OT   Acute on chronic hypoxic respiratory failure Patient wears 5 L [place] at baseline but was having transient hypoxia while in the ED, likely in the setting of missed HD/volume overload. He is now sating 98% on 5L [place], his home requirement, even when reclining. On exam, very slight rales but overall not concerning. Chest x-ray with pulmonary vascular congestion and mild pulmonary edema. Suspect this will improve after patient gets dialyzed. - 5L [place] - HD as above.   Afib  Stable, Chronic anticoagulated with Eliquis 2.5 mg.  He is asymptomatic.  Refractory to cardioversion x 3 and, and not on amiodarone due to amiodarone toxicity. - Increase Eliquis to 5mg  every day    HFrEF Stable.  He denies chest pain or palpitations.  Last echo done in July with EF 20-25%, severely decreased LV function, LVH, and LV diastolic function unable to be evaluated.  He has been evaluated by cardiology, not a candidate for nodal ablation, ICD or pacemaker.  He is yet to follow-up with cardiology outpatient. - Digoxin 0.25 3 times weekly.  - Digoxin level pending - Pt not on GDMT but hypotension requiring midodrine is a limiting factor. Will continue to evaluate if there is a role here.  Ambulatory Dysfunction Pt with immense difficulty navigating the out-of-hospital environment due to deconditioning from his long prior hospitalization, body habitus, foot drop, neuropathy. He is unable to get to outpatient dialysis in part due to this, as well as general transportation challenges. Was not able to attend any outpatient dialysis sessions since leaving rehab on 10/18. - PT/OT involved, thank you. Will follow up recs. - Urea lotion   Neuropathic pain - Oxycodone 15 mg. - gabapentin 300 after dialysis per above - Trial capsaicin cream  Best  Practice: Diet: Renal diet IVF: none VTE: Eliquis Code: Full AB: None Therapy Recs: Pending DISPO: Anticipated discharge in 1-3 days to rehab vs SNF pending  evaluation .  Signature: [name], D.O.  Internal Medicine Resident, PGY-1 [facility]  Pager: [phone] 11:33 AM, 05/19/2023   Please contact the on call pager after 5 pm and on weekends at [phone].

## 2023-05-19 NOTE — Telephone Encounter (Signed)
Called to confirm/remind patient of their appointment [facility] on 05/20/23.However, pt stated he has been admitted into the hospital.

## 2023-05-19 NOTE — Evaluation (Signed)
Physical Therapy Evaluation Patient Details Name: Phillip Young MRN: [id number] DOB: Dec 05, 1985 Today's Date: 05/19/2023  History of Present Illness  Pt is a 37 y/o M admitted on 05/18/23 after presenting with c/o missing dialysis x 1 week. Pt with hospital admission in June 2024, transitioned to rehab, and d/c home on 05/13/23 but was unable to get to transport vehicle. HH nurse advised pt to come to ED after missing dialysis appointments. PMH: a-fib, HFrEF, severe obesity, ESRD on HD MWF, chronic hypoxic respiratory failure on 5L O2, cardiac cath, cardioversion  Clinical Impression  Pt seen for PT evaluation with pt agreeable, OT arriving midway through session. Pt reports prior to hospitalization in June he was independent, but recently returned home from SNF rehab & was able to complete step pivot transfers bed>w/c with RW & supervision. Pt notes once he missed dialysis he became bed bound. On this date, pt endorses BLE pain that limits mobility. Pt is able to complete supine>sit with min assist, sit>supine with mod assist +2. Pt tolerates sitting EOB ~6 minutes with supervision, no LOB. Pt declined standing attempts 2/2 BLE pain but is appreciative of PT/OT efforts. SPO2 >90% on 4L/min throughout session. Will continue to follow pt acutely to progress mobility as able.        If plan is discharge home, recommend the following: Two people to help with walking and/or transfers;Two people to help with bathing/dressing/bathroom;Help with stairs or ramp for entrance;Assist for transportation;Assistance with cooking/housework   Can travel by private vehicle   No    Equipment Recommendations None recommended by PT  Recommendations for Other Services       Functional Status Assessment Patient has had a recent decline in their functional status and demonstrates the ability to make significant improvements in function in a reasonable and predictable amount of time.     Precautions / Restrictions  Precautions Precautions: Fall Precaution Comments: R foot drop Restrictions Weight Bearing Restrictions: No      Mobility  Bed Mobility Overal bed mobility: Needs Assistance Bed Mobility: Supine to Sit, Sit to Supine     Supine to sit: Min assist, HOB elevated, Used rails Sit to supine: +2 for physical assistance, Mod assist, HOB elevated, Used rails   General bed mobility comments: Pt is able to scoot to [facility] with bed in [place] position, use of bed rails.    Transfers Overall transfer level:  (pt declined 2/2 BLE foot pain)                      Ambulation/Gait                  Stairs            Wheelchair Mobility     Tilt Bed    Modified Rankin (Stroke Patients Only)       Balance Overall balance assessment: Needs assistance Sitting-balance support: Feet supported Sitting balance-Leahy Scale: Fair Sitting balance - Comments: supervision static sitting EOB                                     Pertinent Vitals/Pain Pain Assessment Pain Assessment: Faces Faces Pain Scale: Hurts whole lot Pain Location: BLE with movement/touch Pain Descriptors / Indicators: Grimacing, Burning, Discomfort Pain Intervention(s): Monitored during session, Limited activity within patient's tolerance, Patient requesting pain meds-RN notified    Home Living Family/patient expects to be discharged to::  Private residence Living Arrangements: Parent Available Help at Discharge: Family Type of Home: House Home Access: Stairs to enter;Level entry (stairs at front, level entry at back)       Home Layout: Able to live on main level with bedroom/bathroom;Two level Home Equipment: Wheelchair - Conservation officer, historic buildings (2 wheels);Other (comment) (hoyer lift & sling) Additional Comments: Recieving HH nursing care following return home from SNF rehab.    Prior Function               Mobility Comments: Prior to admission in June  2024 pt was independent with mobility, since d/c from [facility] & return home pt was able to complete STS with supervision (easier time from elevated surfaces), step pivot bed>w/c but when he started missing dialysis pt was limited to bed mobility, using bed pan. ADLs Comments: sponge baths 2/2 dialysis port, started using bed pan at home when he started missing dialysis & BLE began to swell     Extremity/Trunk Assessment   Upper Extremity Assessment Upper Extremity Assessment: Defer to OT evaluation;RUE deficits/detail RUE Deficits / Details: limited shoulder flexion (reports this occurred after hospitalization in June 2024)    Lower Extremity Assessment Lower Extremity Assessment: RLE deficits/detail (BLE edema, hx of BLE neuropathy) RLE Deficits / Details: R foot drop following hospitalization in June 2024       Communication   Communication Communication: No apparent difficulties  Cognition Arousal: Alert Behavior During Therapy: WFL for tasks assessed/performed Overall Cognitive Status: Within Functional Limits for tasks assessed                                 General Comments: agreeable with encouragement/education from therapists        General Comments      Exercises     Assessment/Plan    PT Assessment Patient needs continued PT services  PT Problem List Decreased strength;Pain;Decreased range of motion;Decreased activity tolerance;Decreased balance;Decreased mobility;Decreased safety awareness;Decreased knowledge of use of DME;Decreased skin integrity;Impaired sensation       PT Treatment Interventions Balance training;DME instruction;Modalities;Gait training;Neuromuscular re-education;Stair training;Functional mobility training;Therapeutic activities;Therapeutic exercise;Manual techniques;Wheelchair mobility training;Patient/family education    PT Goals (Current goals can be found in the Care Plan section)  Acute Rehab PT Goals Patient Stated  Goal: decreased pain, get better PT Goal Formulation: With patient Time For Goal Achievement: 05/27/23 Potential to Achieve Goals: Good    Frequency Min 1X/week     Co-evaluation PT/OT/SLP Co-Evaluation/Treatment: Yes Reason for Co-Treatment: For patient/therapist safety PT goals addressed during session: Mobility/safety with mobility;Balance         AM-PAC PT "6 Clicks" Mobility  Outcome Measure Help needed turning from your back to your side while in a flat bed without using bedrails?: A Little Help needed moving from lying on your back to sitting on the side of a flat bed without using bedrails?: A Lot Help needed moving to and from a bed to a chair (including a wheelchair)?: A Lot Help needed standing up from a chair using your arms (e.g., wheelchair or bedside chair)?: A Lot Help needed to walk in hospital room?: Total Help needed climbing 3-5 steps with a railing? : Total 6 Click Score: 11    End of Session Equipment Utilized During Treatment: Oxygen (4L O2) Activity Tolerance: Patient tolerated treatment well;Patient limited by pain Patient left: in bed;with call bell/phone within reach;with bed alarm set Nurse Communication: Mobility status;Patient requests pain meds (provided  pt with cup of ice) PT Visit Diagnosis: Muscle weakness (generalized) (M62.81);Pain;Other abnormalities of gait and mobility (R26.89);Difficulty in walking, not elsewhere classified (R26.2) Pain - Right/Left:  (bilateral) Pain - part of body: Ankle and joints of foot    Time: 0922-1001 PT Time Calculation (min) (ACUTE ONLY): 39 min   Charges:   PT Evaluation $PT Eval Low Complexity: 1 Low   PT General Charges $$ ACUTE PT VISIT: 1 Visit         Aleda Grana, PT, DPT 05/19/23, 10:26 AM   Sandi Mariscal 05/19/2023, 10:25 AM

## 2023-05-19 NOTE — Evaluation (Signed)
Occupational Therapy Evaluation Patient Details Name: Phillip Young MRN: [id number] DOB: 1985-11-18 Today's Date: 05/19/2023   History of Present Illness Pt is a 37 y/o M admitted on 05/18/23 after presenting with c/o missing dialysis x 1 week. Pt with hospital admission in June 2024, transitioned to rehab, and d/c home on 05/13/23 but was unable to get to transport vehicle. HH nurse advised pt to come to ED after missing dialysis appointments. PMH: a-fib, HFrEF, severe obesity, ESRD on HD MWF, chronic hypoxic respiratory failure on 5L [place], cardiac cath, cardioversion   Clinical Impression   Pt admitted for above, he has a complex issue with getting transportation to his appointments due to home support not being able to transfer him uphill to the parked vans. He declined OOB attempts due to 2/2 BLE pain but was receptive to getting to EOB. Needs +2 assist for bed mobility and assume +2 for OOB at this time, requires Total to setup assist for his ADLs. OT to follow-up with pt to deliver therabands later for him to continue his HEP from PTA. Pt would benefit from continued acute skilled OT services to address deficits and help transition to next level of care. Patient has the potential to reach Mod I and demos the ability to tolerate 3 hours of therapy. Pt would benefit from an intensive rehab program to help maximize functional independence.       If plan is discharge home, recommend the following: Two people to help with walking and/or transfers;A lot of help with bathing/dressing/bathroom;Assistance with cooking/housework    Functional Status Assessment  Patient has had a recent decline in their functional status and demonstrates the ability to make significant improvements in function in a reasonable and predictable amount of time.  Equipment Recommendations  None recommended by OT (defer to next level of care)    Recommendations for Other Services Rehab consult     Precautions /  Restrictions Precautions Precautions: Fall Precaution Comments: R foot drop- no AFO Restrictions Weight Bearing Restrictions: No      Mobility Bed Mobility Overal bed mobility: Needs Assistance Bed Mobility: Supine to Sit, Sit to Supine     Supine to sit: Min assist, HOB elevated, Used rails Sit to supine: +2 for physical assistance, Mod assist, HOB elevated, Used rails   General bed mobility comments: Pt is able to scoot to [facility] with bed in [place] position, use of bed rails.    Transfers                   General transfer comment: pt declined 2/2 BLE foot pain      Balance Overall balance assessment: Needs assistance Sitting-balance support: Feet supported Sitting balance-Leahy Scale: Fair Sitting balance - Comments: supervision static sitting EOB       Standing balance comment: declined                           ADL either performed or assessed with clinical judgement   ADL Overall ADL's : Needs assistance/impaired Eating/Feeding: Independent;Sitting;Bed level   Grooming: Sitting;Set up Grooming Details (indicate cue type and reason): can do upper body grooming with setup assist in sitting, needs significant assist for LB hygiene routine (see LBB) Upper Body Bathing: Sitting;Set up   Lower Body Bathing: Maximal assistance;Sitting/lateral leans Lower Body Bathing Details (indicate cue type and reason): Max A to apply lotion to BLEs, he can assist by applying it to his shins but not his feet.  Upper Body Dressing : Sitting;Minimal assistance   Lower Body Dressing: Total assistance;Sitting/lateral leans Lower Body Dressing Details (indicate cue type and reason): declined putting on socks due to feet huring   Toilet Transfer Details (indicate cue type and reason): declined OOB mobility   Toileting - Clothing Manipulation Details (indicate cue type and reason): declined OOB             Vision         Perception         Praxis          Pertinent Vitals/Pain Pain Assessment Pain Assessment: Faces Faces Pain Scale: Hurts whole lot Pain Location: BLE with movement/touch Pain Descriptors / Indicators: Grimacing, Burning, Discomfort Pain Intervention(s): Limited activity within patient's tolerance, Monitored during session, Patient requesting pain meds-RN notified     Extremity/Trunk Assessment Upper Extremity Assessment Upper Extremity Assessment: Defer to OT evaluation;RUE deficits/detail RUE Deficits / Details: limited shoulder flexion, pain with PROM beyond 90*  and PROM is WNL but he can acheive ~80* AROM but pain persists (reports this occurred after hospitalization in June 2024) RUE: Shoulder pain with ROM   Lower Extremity Assessment Lower Extremity Assessment:  (BLE edema, hx of BLE neuropathy) RLE Deficits / Details: R foot drop following hospitalization in June 2024       Communication Communication Communication: No apparent difficulties   Cognition Arousal: Alert Behavior During Therapy: WFL for tasks assessed/performed Overall Cognitive Status: Within Functional Limits for tasks assessed                                 General Comments: agreeable with encouragement/education from therapists     General Comments  SPO2 >90% on 4L/min throughout session    Exercises     Shoulder Instructions      Home Living Family/patient expects to be discharged to:: Private residence Living Arrangements: Parent Available Help at Discharge: Family Type of Home: House Home Access: Stairs to enter;Level entry (stairs at front, level entry at back)     Home Layout: Able to live on main level with bedroom/bathroom;Two level               Home Equipment: Wheelchair - Conservation officer, historic buildings (2 wheels);Other (comment) (hoyer lift and sling)   Additional Comments: Recieving HH nursing care following return home from SNF rehab.      Prior Functioning/Environment                Mobility Comments: Prior to admission in June 2024 pt was independent with mobility, since d/c from [facility] & return home pt was able to complete STS with supervision (easier time from elevated surfaces), step pivot bed>w/c but when he started missing dialysis pt was limited to bed mobility, using bed pan. ADLs Comments: sponge baths 2/2 dialysis port, started using bed pan at home when he started missing dialysis & BLE began to swell        OT Problem List: Impaired balance (sitting and/or standing);Pain;Obesity      OT Treatment/Interventions: Self-care/ADL training;Balance training;Therapeutic exercise;Therapeutic activities;Patient/family education;DME and/or AE instruction    OT Goals(Current goals can be found in the care plan section) Acute Rehab OT Goals Patient Stated Goal: To get stronger and find transportation to appointments OT Goal Formulation: With patient Time For Goal Achievement: 06/02/23 Potential to Achieve Goals: Good ADL Goals Pt Will Perform Lower Body Bathing: sitting/lateral leans;with set-up;with supervision;with adaptive equipment Pt Will  Transfer to Toilet: with +2 assist;with max assist;stand pivot transfer;bedside commode Pt/caregiver will Perform Home Exercise Program: Increased strength;Independently;With theraband;Both right and left upper extremity Additional ADL Goal #1: Pt will independently advocate the need for feet moisturization prn to staff, to prevent skin breakdown, at least 3x throughout the week.  OT Frequency: Min 1X/week    Co-evaluation PT/OT/SLP Co-Evaluation/Treatment: Yes Reason for Co-Treatment: For patient/therapist safety PT goals addressed during session: Mobility/safety with mobility;Balance OT goals addressed during session: ADL's and self-care      AM-PAC OT "6 Clicks" Daily Activity     Outcome Measure Help from another person eating meals?: None Help from another person taking care of personal grooming?: A  Little Help from another person toileting, which includes using toliet, bedpan, or urinal?: Total Help from another person bathing (including washing, rinsing, drying)?: A Lot Help from another person to put on and taking off regular upper body clothing?: A Little Help from another person to put on and taking off regular lower body clothing?: Total 6 Click Score: 14   End of Session Nurse Communication: Need for lift equipment;Patient requests pain meds (uses hoyer lift at baseline, needs pain meds)  Activity Tolerance: Patient tolerated treatment well Patient left: in bed;with call bell/phone within reach  OT Visit Diagnosis: Unsteadiness on feet (R26.81);Other abnormalities of gait and mobility (R26.89);Pain Pain - Right/Left:  (bilat) Pain - part of body: Ankle and joints of foot                Time: 2956-2130 OT Time Calculation (min): 30 min Charges:  OT General Charges $OT Visit: 1 Visit OT Evaluation $OT Eval Moderate Complexity: 1 Mod  05/19/2023  AB, OTR/L  Acute Rehabilitation Services  Office: [phone]   Tristan Schroeder 05/19/2023, 10:49 AM

## 2023-05-20 ENCOUNTER — Encounter (HOSPITAL_COMMUNITY): Payer: BC Managed Care – PPO

## 2023-05-20 DIAGNOSIS — J9621 Acute and chronic respiratory failure with hypoxia: Secondary | ICD-10-CM | POA: Diagnosis not present

## 2023-05-20 LAB — CBC
HCT: 26.4 % — ABNORMAL LOW (ref 39.0–52.0)
Hemoglobin: 8.3 g/dL — ABNORMAL LOW (ref 13.0–17.0)
MCH: 31.4 pg (ref 26.0–34.0)
MCHC: 31.4 g/dL (ref 30.0–36.0)
MCV: 100 fL (ref 80.0–100.0)
Platelets: 251 10*3/uL (ref 150–400)
RBC: 2.64 MIL/uL — ABNORMAL LOW (ref 4.22–5.81)
RDW: 15.2 % (ref 11.5–15.5)
WBC: 5.6 10*3/uL (ref 4.0–10.5)
nRBC: 0 % (ref 0.0–0.2)

## 2023-05-20 LAB — RENAL FUNCTION PANEL
Albumin: 2.5 g/dL — ABNORMAL LOW (ref 3.5–5.0)
Anion gap: 15 (ref 5–15)
BUN: 35 mg/dL — ABNORMAL HIGH (ref 6–20)
CO2: 21 mmol/L — ABNORMAL LOW (ref 22–32)
Calcium: 10.1 mg/dL (ref 8.9–10.3)
Chloride: 98 mmol/L (ref 98–111)
Creatinine, Ser: 9.6 mg/dL — ABNORMAL HIGH (ref 0.61–1.24)
GFR, Estimated: 7 mL/min — ABNORMAL LOW (ref >60)
Glucose, Bld: 81 mg/dL (ref 70–99)
Phosphorus: 5.9 mg/dL — ABNORMAL HIGH (ref 2.5–4.6)
Potassium: 5.1 mmol/L (ref 3.5–5.1)
Sodium: 134 mmol/L — ABNORMAL LOW (ref 135–145)

## 2023-05-20 LAB — HEPATITIS B SURFACE ANTIBODY, QUANTITATIVE: Hep B S AB Quant (Post): 72.9 m[IU]/mL

## 2023-05-20 MED ORDER — NEPRO/CARBSTEADY PO LIQD
237.0000 mL | Freq: Two times a day (BID) | ORAL | Status: DC
  Administered 2023-05-23 – 2023-06-02 (×5): 237 mL via ORAL

## 2023-05-20 MED ORDER — LIDOCAINE 4 % EX CREA: TOPICAL_CREAM | Freq: Every day | CUTANEOUS | Status: DC | PRN

## 2023-05-20 MED ORDER — DARBEPOETIN ALFA 100 MCG/0.5ML IJ SOSY
100.0000 ug | PREFILLED_SYRINGE | INTRAMUSCULAR | Status: DC
Start: 2023-05-21 — End: 2023-05-26
  Administered 2023-05-21: 100 ug via SUBCUTANEOUS
  Filled 2023-05-20: qty 0.5

## 2023-05-20 NOTE — Progress Notes (Signed)
HD#1 SUBJECTIVE:  Patient Summary: Phillip Young is a 37 y.o. with a pertinent PMH of ESRD on MWF HD, HFrEF, chronic hypoxic respiratory failure on 5L [place], Afib on Eliquis who presented with missed dialysis and dyspnea and admitted for acute on chronic hypoxic respiratory failure.    Overnight Events: None.  Interim History: Pt went to dialysis yesterday without concern. No dyspnea today. Does have b/l foot pain, although he feels it is better than in past. Concerned about his foot drop (on R) that started after he was sedated at prior hospitalization.  OBJECTIVE:  Vital Signs: Vitals:   05/19/23 1617 05/19/23 2021 05/20/23 0554 05/20/23 0900  BP: 112/85 114/79 121/66 (!) 112/52  Pulse: 98 80 65 82  Resp: 18 18 18    Temp: 98.2 F (36.8 C) 97.9 F (36.6 C) 98.3 F (36.8 C) 98.4 F (36.9 C)  TempSrc:  Oral  Oral  SpO2: 95% 100% 100% 98%  Weight:      Height:       Supplemental O2: Nasal Cannula SpO2: 98 % O2 Flow Rate (L/min): 4 L/min  Filed Weights   05/18/23 1454 05/19/23 1122 05/19/23 1558  Weight: (!) 172.4 kg (!) 177.5 kg (!) 173.5 kg     Intake/Output Summary (Last 24 hours) at 05/20/2023 1205 Last data filed at 05/20/2023 0700 Gross per 24 hour  Intake 0 ml  Output 4000 ml  Net -4000 ml   Net IO Since Admission: -4,000 mL [05/20/23 1205]  Physical Exam: Physical Exam Constitutional:      General: He is not in acute distress.    Appearance: He is obese. He is not ill-appearing.  HENT:     Head: Normocephalic and atraumatic.  Cardiovascular:     Rate and Rhythm: Normal rate and regular rhythm.     Pulses: Normal pulses.  Pulmonary:     Effort: Pulmonary effort is normal.     Breath sounds: Normal breath sounds. No wheezing, rhonchi or rales.  Abdominal:     General: Abdomen is flat.     Palpations: Abdomen is soft.     Tenderness: There is no abdominal tenderness.  Musculoskeletal:        General: Tenderness present.     Right lower leg: Edema  present.     Left lower leg: Edema present.     Comments: Tenderness to feet and legs bilaterally below the knee. R foot drop.  Skin:    General: Skin is warm and dry.     Comments: Flaky, dry skin on feet.  Neurological:     General: No focal deficit present.     Mental Status: He is alert and oriented to person, place, and time.  Psychiatric:        Mood and Affect: Mood normal.        Behavior: Behavior normal.     Patient Lines/Drains/Airways Status     Active Line/Drains/Airways     Name Placement date Placement time Site Days   Hemodialysis Catheter Right Internal jugular Double lumen Permanent (Tunneled) 03/09/23  0933  Internal jugular  72   Pressure Injury 02/03/23 Coccyx Mid Stage 2 -  Partial thickness loss of dermis presenting as a shallow open injury with a red, pink wound bed without slough. 02/03/23  0800  -- 106   Pressure Injury 05/18/23 Buttocks Right Stage 3 -  Full thickness tissue loss. Subcutaneous fat may be visible but bone, tendon or muscle are NOT exposed. 05/18/23  2300  --  2   Pressure Injury 05/18/23 Buttocks Left Stage 2 -  Partial thickness loss of dermis presenting as a shallow open injury with a red, pink wound bed without slough. 05/18/23  2300  -- 2             ASSESSMENT/PLAN:  Assessment: Principal Problem:   Acute on chronic hypoxic respiratory failure ([name]) Active Problems:   ESRD (end stage renal disease) on dialysis ([name])   Congestive heart failure ([name])  Phillip Young is a 37 y.o. man with a pertinent PMH of ESRD on MWF HD, HFrEF, chronic hypoxic respiratory failure on 5L [place], Afib on Eliquis who presented with shortness of breath, and admitted for acute on chronic RF and volume overload due to missed dialysis.   ESRD on HD MWF Chronic Hypotension Prior to admission, patient's last dialysis session was on 10/16, while the patient was still at [facility]. He was discharged to home on Friday, 10/18, but did not been able to attend  dialysis since he is not able to get to his transportation - he cannot ambulate or safely get himself into a car.   - Nephrology involved, thank you. HD yesterday and tomorrow. He has consistently required midodrine for HD sessions.  - Midodrine 30 mg 3 times daily for hypotension - Gabapentin 300 mg 3 times daily after HD. - Fosrenol 2000 mg TID with meals - PT/ OT evaluated, considered rehab for this patient but not felt to be a candidate. SNF instead. - Social work is looking for SNF, thank you   Acute on chronic hypoxic respiratory failure (resolved) HFrEF Patient wears 5 L [place] at baseline but was having transient hypoxia while in the ED, likely in the setting of missed HD/volume overload. He is now sating 98% on 4L [place] while reclining, better than his home requirement. On exam, lungs are clear. He does have LE edema but his weight not elevated from previous admissions, feel that his HFrEF is stable. Last echo done in July with EF 20-25%, severely decreased LV function, LVH, and LV diastolic function unable to be evaluated. He has been evaluated by cardiology, not a candidate for nodal ablation, ICD or pacemaker.  He is yet to follow-up with cardiology outpatient. - Digoxin 0.25 3 times weekly.  - Digoxin level pending - Pt not on GDMT but hypotension requiring midodrine is a limiting factor. Will continue to evaluate if there is a role here. - 5L [place] - HD as above   Afib  Stable, Chronic anticoagulated with Eliquis 2.5 mg.  He is asymptomatic.  Refractory to cardioversion x 3 and, and not on amiodarone due to amiodarone toxicity. Beta blockers poor choice due to hypotension. - Increase Eliquis to 5mg  every day. Was at reduced dose previously due to anal fissures, but it resolved. Will monitor. Pt agreeable to try full standard dosing.    Ambulatory Dysfunction Pt with immense difficulty navigating the out-of-hospital environment due to deconditioning from his long prior hospitalization, body  habitus, foot drop, neuropathy. He is unable to get to outpatient dialysis in part due to this, as well as general transportation challenges. Was not able to attend any outpatient dialysis sessions since leaving rehab on 10/18. - PT/OT involved, thank you. Current recs for SNF. Recommend OP evaluation of his foot drop.   Neuropathic pain Dry feet - Oxycodone 15 mg. - gabapentin 300 after dialysis per above - Urea lotion  - Trial lidocaine lotion  Best Practice: Diet: Renal diet IVF: none VTE: Eliquis Code:  Full AB: None Therapy Recs: Pending DISPO: Anticipated discharge in 1-3 days to SNF pending placement.  Signature: [name], D.O.  Internal Medicine Resident, PGY-1 [facility]  Pager: [phone] 12:05 PM, 05/20/2023   Please contact the on call pager after 5 pm and on weekends at [phone].

## 2023-05-20 NOTE — NC FL2 (Addendum)
[place] MEDICAID FL2 LEVEL OF CARE FORM     IDENTIFICATION  Patient Name: Phillip Young Birthdate: January 16, 1986 Sex: male Admission Date (Current Location): 05/18/2023  [facility] and [place] Number:  Producer, television/film/video and Address:  [facility], [address] [place], [place] [address]      Provider Number: [id number]  Attending Physician Name and Address:  Inez Catalina, MD  Relative Name and Phone Number:       Current Level of Care: Hospital Recommended Level of Care: Skilled Nursing Facility Prior Approval Number:    Date Approved/Denied:   PASRR Number: [id number]  Discharge Plan: SNF    Current Diagnoses: Patient Active Problem List   Diagnosis Date Noted   Hyperkalemia 03/21/2023   Central line infection 03/10/2023   Pressure injury of skin 01/23/2023   Acute hypoxic respiratory failure ([name]) 01/21/2023   Atrial fibrillation ([name]) 01/21/2023   Acute on chronic hypoxic respiratory failure ([name]) 08/07/2022   Congestive heart failure ([name]) 08/05/2022   Elevated liver enzymes 06/08/2022   Multiple nodules of lung 05/31/2022   Sepsis with acute organ dysfunction without septic shock ([name])    Advanced care planning/counseling discussion    Recurrent infections 09/30/2021   Reactive depression 09/02/2021   Hypervolemia 08/18/2021   Type 2 diabetes mellitus with diabetic chronic kidney disease ([name]) 06/26/2021   ESRD on dialysis [facility])    Medical non-compliance 04/21/2021   Hypotension 04/21/2021   ESRD (end stage renal disease) on dialysis ([name]) 04/16/2021   ESRD (end stage renal disease) ([name]) 04/12/2021   Paroxysmal atrial flutter ([name]) 04/12/2021   Cardiogenic shock ([name])    Chronic respiratory failure with hypoxia ([name]) 02/11/2021   Chronic systolic CHF (congestive heart failure) ([name])    Chronic cough    Morbid obesity ([name]) 02/26/2020   GERD without esophagitis 02/26/2020   OSA (obstructive sleep apnea) 02/26/2020    Hidradenitis suppurativa 02/26/2020    Orientation RESPIRATION BLADDER Height & Weight     Self, Time, Situation, Place  O2 Continent Weight: (!) 382 lb 8 oz (173.5 kg) Height:  6' (182.9 cm)  BEHAVIORAL SYMPTOMS/MOOD NEUROLOGICAL BOWEL NUTRITION STATUS      Continent Diet (Renal with Fluid Restriction)  AMBULATORY STATUS COMMUNICATION OF NEEDS Skin   Extensive Assist Verbally Normal                       Personal Care Assistance Level of Assistance  Bathing, Feeding, Dressing Bathing Assistance: Maximum assistance Feeding assistance: Independent Dressing Assistance: Limited assistance     Functional Limitations Info             SPECIAL CARE FACTORS FREQUENCY  PT (By licensed PT), OT (By licensed OT)     PT Frequency: 5x/week OT Frequency: 5x/week            Contractures      Additional Factors Info  Code Status, Allergies Code Status Info: Full Allergies Info: Amiodarone, Coreg (Carvedilol), Heparin, Metoprolol, Amoxicillin,           Current Medications (05/20/2023):  This is the current hospital active medication list Current Facility-Administered Medications  Medication Dose Route Frequency Provider Last Rate Last Admin   acetaminophen (TYLENOL) tablet 650 mg  650 mg Oral Q6H PRN Atway, Rayann N, DO       Or   acetaminophen (TYLENOL) suppository 650 mg  650 mg Rectal Q6H PRN Atway, Rayann N, DO       apixaban (  ELIQUIS) tablet 5 mg  5 mg Oral BID [name], DO   5 mg at 05/19/23 2058   capsaicin (ZOSTRIX) 0.025 % cream   Topical Daily [name], DO   Given at 05/19/23 1628   Chlorhexidine Gluconate Cloth 2 % PADS 6 each  6 each Topical Q0600 Maxie Barb, MD   6 each at 05/20/23 1478   digoxin (LANOXIN) tablet 0.125 mg  0.125 mg Oral Once per day on Monday Wednesday Friday Atway, Rayann N, DO   0.125 mg at 05/20/23 0805   gabapentin (NEURONTIN) capsule 300 mg  300 mg Oral Q M,W,F-HD Atway, Rayann N, DO       Gerhardt's  butt cream   Topical TID Inez Catalina, MD   Given at 05/19/23 2100   lanthanum (FOSRENOL) chewable tablet 2,000 mg  2,000 mg Oral TID WC Atway, Rayann N, DO   2,000 mg at 05/19/23 1638   leptospermum manuka honey (MEDIHONEY) paste 1 Application  1 Application Topical Daily Inez Catalina, MD   1 Application at 05/19/23 1049   levothyroxine (SYNTHROID) tablet 25 mcg  25 mcg Oral Q0600 [name], DO   25 mcg at 05/20/23 0539   midodrine (PROAMATINE) tablet 30 mg  30 mg Oral TID WC Atway, Rayann N, DO   30 mg at 05/20/23 1000   ondansetron (ZOFRAN) tablet 4 mg  4 mg Oral Q8H PRN Laretta Bolster, MD   4 mg at 05/20/23 0759   oxyCODONE (Oxy IR/ROXICODONE) immediate release tablet 15 mg  15 mg Oral Q6H PRN Atway, Rayann N, DO   15 mg at 05/20/23 0759   sodium hypochlorite (DAKIN'S 1/4 STRENGTH) topical solution   Irrigation Daily Inez Catalina, MD   Given at 05/19/23 1049   urea 10 % lotion   Topical Daily Marrianne Mood, MD   Given at 05/19/23 1626     Discharge Medications: Please see discharge summary for a list of discharge medications.  Relevant Imaging Results:  Relevant Lab Results:   Additional Information SSN: [id number] out-pt HD at [facility]) on MWF 11:55 am chair time   Antion Felipa [place], Student-Social Work

## 2023-05-20 NOTE — Plan of Care (Signed)
  Problem: Education: Goal: Knowledge of General Education information will improve Description: Including pain rating scale, medication(s)/side effects and non-pharmacologic comfort measures Outcome: Progressing   Problem: Health Behavior/Discharge Planning: Goal: Ability to manage health-related needs will improve Outcome: Progressing   Problem: Clinical Measurements: Goal: Ability to maintain clinical measurements within normal limits will improve Outcome: Progressing Goal: Will remain free from infection Outcome: Progressing Goal: Diagnostic test results will improve Outcome: Progressing Goal: Respiratory complications will improve Outcome: Progressing Goal: Cardiovascular complication will be avoided Outcome: Progressing   Problem: Coping: Goal: Level of anxiety will decrease Outcome: Progressing   Problem: Elimination: Goal: Will not experience complications related to bowel motility Outcome: Progressing Goal: Will not experience complications related to urinary retention Outcome: Progressing   Problem: Pain Management: Goal: General experience of comfort will improve Outcome: Progressing   Problem: Safety: Goal: Ability to remain free from injury will improve Outcome: Progressing   Problem: Skin Integrity: Goal: Risk for impaired skin integrity will decrease Outcome: Progressing   Problem: Activity: Goal: Risk for activity intolerance will decrease Outcome: Not Progressing- refused out of bed for me.    Problem: Nutrition: Goal: Adequate nutrition will be maintained Outcome: Not Progressing  No meals today. N/v

## 2023-05-20 NOTE — Progress Notes (Signed)
[facility] Progress Note   Subjective:   Seen in room - ongoing dyspnea and BLE pain/edema, very concerned about R foot drop symptoms. No CP/abd pain. Tells me will need HH and transportation arranged -> will pass this info to SW.  Objective Vitals:   05/19/23 1617 05/19/23 2021 05/20/23 0554 05/20/23 0900  BP: 112/85 114/79 121/66 (!) 112/52  Pulse: 98 80 65 82  Resp: 18 18 18    Temp: 98.2 F (36.8 C) 97.9 F (36.6 C) 98.3 F (36.8 C) 98.4 F (36.9 C)  TempSrc:  Oral  Oral  SpO2: 95% 100% 100% 98%  Weight:      Height:       Physical Exam General: Overweight man, NAD. Nasal O2 in place (5L/min). Heart: RRR; no murmur Lungs: CTA anteriorly Abdomen: soft Extremities: 3+ BLE edema with tenderness bilaterally Dialysis Access: TDC in R chest  Additional Objective Labs: Basic Metabolic Panel: Recent Labs  Lab 05/18/23 1729 05/19/23 0451  NA 137 135  K 5.5* 5.5*  CL 102 102  CO2 21* 21*  GLUCOSE 84 85  BUN 44* 46*  CREATININE 13.11* 13.56*  CALCIUM 10.5* 10.2  PHOS  --  6.0*   Liver Function Tests: Recent Labs  Lab 05/18/23 1729 05/19/23 0451  AST 41  --   ALT 26  --   ALKPHOS 149*  --   BILITOT 0.7  --   PROT 6.7  --   ALBUMIN 2.8* 2.4*   CBC: Recent Labs  Lab 05/18/23 1729 05/19/23 0451  WBC 7.2 5.5  NEUTROABS 4.7  --   HGB 9.3* 7.8*  HCT 30.3* 25.6*  MCV 105.2* 101.6*  PLT 253 229   Studies/Results: DG Chest Portable 1 View  Result Date: 05/18/2023 CLINICAL DATA:  Hypoxia.  Shortness of breath. EXAM: PORTABLE CHEST 1 VIEW COMPARISON:  March 08, 2023. FINDINGS: Stable cardiomegaly with central pulmonary vascular congestion. Right internal jugular dialysis catheter is noted. Mild bilateral pulmonary edema may be present. Bony thorax is unremarkable. IMPRESSION: Stable cardiomegaly with central pulmonary vascular congestion and possible mild bilateral pulmonary edema. Electronically Signed   By: Lupita Raider M.D.   On:  05/18/2023 18:53    Medications:   apixaban  5 mg Oral BID   capsaicin   Topical Daily   Chlorhexidine Gluconate Cloth  6 each Topical Q0600   digoxin  0.125 mg Oral Once per day on Monday Wednesday Friday   gabapentin  300 mg Oral Q M,W,F-HD   Gerhardt's butt cream   Topical TID   lanthanum  2,000 mg Oral TID WC   leptospermum manuka honey  1 Application Topical Daily   levothyroxine  25 mcg Oral Q0600   midodrine  30 mg Oral TID WC   sodium hypochlorite   Irrigation Daily   urea   Topical Daily    Dialysis Orders: MWF at [facility] 4.5hr, 400/800, EDW 172kg, 2K/2Ca bath, TDC, no heparin  Assessment/Plan: 1. Acute Hypoxic RF: In setting of missed HD due to transportation issues. Remains overloaded, UF as tolerated. 2. ESRD: Off usual MWF schedule this week, s/p HD yesterday with 4L off. Due to staffing issues, next HD will be tomorrow - Sat 10/26. Maximizing UF as tolerated. + HIT - no heparin. 3. HypoTN/volume: BP stable on high dose midodrine (30mg  TID), lots of edema still, will need EDW lowered. 4. Anemia: Hgb 7.8 - will give dose Aranesp today. 5. Secondary hyperparathyroidism: CorrCa high, Phos high - continue lanthanum binders. No  VDRA. Check PTH. 6. Nutrition: Alb low, adding supps. 7. HFrEF 8. Obesity  Ozzie Hoyle, Cordelia Poche 05/20/2023, 10:25 AM  [employer]

## 2023-05-20 NOTE — TOC Initial Note (Signed)
Transition of Care [facility]) - Initial/Assessment Note    Patient Details  Name: Phillip Young MRN: [id number] Date of Birth: 1986/06/19  Transition of Care [facility]) CM/SW Contact:    Erin Sons, LCSW Phone Number: 05/20/2023, 12:02 PM  Clinical Narrative:                  CSW met with pt bedside to discuss SNF rec. Pt states he currently is staying with his mother in [place] at [address] His plan was to use [facility] for transport to dialysis but they will not come to the back of his apartment to get him from door to vehicle. Alternative option would be medicaid transportation which may be more willing to assist pt from door to vehicle. Pt does not have medicaid but states he has applied for medicaid with his Child psychotherapist at HD clinic. Pt is agreeable to SNF workup. He states he was at [facility] recently and discharged to his mothers house. Fl2 completed and bed requests sent in hub.   Expected Discharge Plan: Skilled Nursing Facility Barriers to Discharge: SNF Pending bed offer   Patient Goals and CMS Choice            Expected Discharge Plan and Services       Living arrangements for the past 2 months: Apartment                                      Prior Living Arrangements/Services Living arrangements for the past 2 months: Apartment Lives with:: Parents Patient language and need for interpreter reviewed:: Yes        Need for Family Participation in Patient Care: No (Comment) Care giver support system in place?: No (comment)   Criminal Activity/Legal Involvement Pertinent to Current Situation/Hospitalization: No - Comment as needed  Activities of Daily Living   ADL Screening (condition at time of admission) Independently performs ADLs?: No Does the patient have a NEW difficulty with bathing/dressing/toileting/self-feeding that is expected to last >3 days?: Yes (Initiates electronic notice to provider for possible OT consult) Does the  patient have a NEW difficulty with getting in/out of bed, walking, or climbing stairs that is expected to last >3 days?: Yes (Initiates electronic notice to provider for possible PT consult) Does the patient have a NEW difficulty with communication that is expected to last >3 days?: No Is the patient deaf or have difficulty hearing?: No Does the patient have difficulty seeing, even when wearing glasses/contacts?: No Does the patient have difficulty concentrating, remembering, or making decisions?: No  Permission Sought/Granted                  Emotional Assessment Appearance:: Appears stated age Attitude/Demeanor/Rapport: Engaged Affect (typically observed): Accepting Orientation: : Oriented to Self, Oriented to Place, Oriented to  Time, Oriented to Situation Alcohol / Substance Use: Not Applicable Psych Involvement: No (comment)  Admission diagnosis:  Hypervolemia, unspecified hypervolemia type [E87.70] Acute on chronic hypoxic respiratory failure ([name]) [J96.21] Patient Active Problem List   Diagnosis Date Noted   Hyperkalemia 03/21/2023   Central line infection 03/10/2023   Pressure injury of skin 01/23/2023   Acute hypoxic respiratory failure ([name]) 01/21/2023   Atrial fibrillation ([name]) 01/21/2023   Acute on chronic hypoxic respiratory failure ([name]) 08/07/2022   Congestive heart failure ([name]) 08/05/2022   Elevated liver enzymes 06/08/2022   Multiple nodules of lung 05/31/2022  Sepsis with acute organ dysfunction without septic shock ([name])    Advanced care planning/counseling discussion    Recurrent infections 09/30/2021   Reactive depression 09/02/2021   Hypervolemia 08/18/2021   Type 2 diabetes mellitus with diabetic chronic kidney disease ([name]) 06/26/2021   ESRD on dialysis [facility])    Medical non-compliance 04/21/2021   Hypotension 04/21/2021   ESRD (end stage renal disease) on dialysis ([name]) 04/16/2021   ESRD (end stage renal disease) ([name]) 04/12/2021   Paroxysmal  atrial flutter ([name]) 04/12/2021   Cardiogenic shock ([name])    Chronic respiratory failure with hypoxia ([name]) 02/11/2021   Chronic systolic CHF (congestive heart failure) ([name])    Chronic cough    Morbid obesity ([name]) 02/26/2020   GERD without esophagitis 02/26/2020   OSA (obstructive sleep apnea) 02/26/2020   Hidradenitis suppurativa 02/26/2020   PCP:  Marrianne Mood, MD Pharmacy:   [facility] [phone] - [place], [place] - [address] AT [facility] [address] [place] [address] Phone: [phone] Fax: [phone]  [facility] #[id number] [place], [place] - [address] AT [facility] OF [address] [place] [address] Phone: [phone] Fax: [phone]     Social Determinants of Health (SDOH) Social History: SDOH Screenings   Food Insecurity: No Food Insecurity (05/18/2023)  Housing: Low Risk  (05/18/2023)  Transportation Needs: No Transportation Needs (05/18/2023)  Utilities: Not At Risk (05/18/2023)  Alcohol Screen: Low Risk  (09/21/2022)  Depression (PHQ2-9): High Risk (09/21/2022)  Financial Resource Strain: Low Risk  (03/24/2023)   Received from [facility]  Physical Activity: Inactive (09/21/2022)  Social Connections: Moderately Isolated (03/24/2023)   Received from [facility]  Stress: No Stress Concern Present (04/21/2023)   Received from [facility]  Tobacco Use: Medium Risk (05/18/2023)   SDOH Interventions:     Readmission Risk Interventions    03/23/2023    3:48 PM 06/09/2022   12:19 PM 05/12/2021   10:35 AM  Readmission Risk Prevention Plan  Transportation Screening Complete Complete Complete  Medication Review [employer]) Complete Complete Complete  PCP or Specialist appointment within 3-5 days of discharge -- Complete Complete  HRI or Home Care Consult Complete Complete Complete  SW Recovery Care/Counseling Consult Complete Complete Complete   Palliative Care Screening Not Applicable Not Applicable Complete  Skilled Nursing Facility Not Applicable Not Applicable Complete

## 2023-05-21 DIAGNOSIS — J9621 Acute and chronic respiratory failure with hypoxia: Secondary | ICD-10-CM | POA: Diagnosis not present

## 2023-05-21 LAB — RENAL FUNCTION PANEL
Albumin: 2.6 g/dL — ABNORMAL LOW (ref 3.5–5.0)
Anion gap: 12 (ref 5–15)
BUN: 38 mg/dL — ABNORMAL HIGH (ref 6–20)
CO2: 26 mmol/L (ref 22–32)
Calcium: 10.2 mg/dL (ref 8.9–10.3)
Chloride: 97 mmol/L — ABNORMAL LOW (ref 98–111)
Creatinine, Ser: 10.65 mg/dL — ABNORMAL HIGH (ref 0.61–1.24)
GFR, Estimated: 6 mL/min — ABNORMAL LOW (ref >60)
Glucose, Bld: 73 mg/dL (ref 70–99)
Phosphorus: 6.7 mg/dL — ABNORMAL HIGH (ref 2.5–4.6)
Potassium: 5.5 mmol/L — ABNORMAL HIGH (ref 3.5–5.1)
Sodium: 135 mmol/L (ref 135–145)

## 2023-05-21 LAB — CBC
HCT: 27.1 % — ABNORMAL LOW (ref 39.0–52.0)
Hemoglobin: 8.2 g/dL — ABNORMAL LOW (ref 13.0–17.0)
MCH: 30.7 pg (ref 26.0–34.0)
MCHC: 30.3 g/dL (ref 30.0–36.0)
MCV: 101.5 fL — ABNORMAL HIGH (ref 80.0–100.0)
Platelets: 246 10*3/uL (ref 150–400)
RBC: 2.67 MIL/uL — ABNORMAL LOW (ref 4.22–5.81)
RDW: 15.2 % (ref 11.5–15.5)
WBC: 5.8 10*3/uL (ref 4.0–10.5)
nRBC: 0 % (ref 0.0–0.2)

## 2023-05-21 LAB — URINALYSIS, COMPLETE (UACMP) WITH MICROSCOPIC
Bilirubin Urine: NEGATIVE
Glucose, UA: NEGATIVE mg/dL
Ketones, ur: NEGATIVE mg/dL
Nitrite: NEGATIVE
Protein, ur: 100 mg/dL — AB
Specific Gravity, Urine: 1.01 (ref 1.005–1.030)
WBC, UA: >50 WBC/hpf (ref 0–5)
pH: 6 (ref 5.0–8.0)

## 2023-05-21 MED ORDER — ALTEPLASE 2 MG IJ SOLR: 2.0000 mg | Freq: Once | INTRAMUSCULAR | Status: DC | PRN

## 2023-05-21 MED ORDER — LIDOCAINE-PRILOCAINE 2.5-2.5 % EX CREA: 1.0000 | TOPICAL_CREAM | CUTANEOUS | Status: DC | PRN

## 2023-05-21 MED ORDER — ANTICOAGULANT SODIUM CITRATE 4% (200MG/5ML) IV SOLN: 5.0000 mL | Status: DC | PRN

## 2023-05-21 MED ORDER — LIDOCAINE HCL (PF) 1 % IJ SOLN: 5.0000 mL | INTRAMUSCULAR | Status: DC | PRN

## 2023-05-21 MED ORDER — PENTAFLUOROPROP-TETRAFLUOROETH EX AERO: 1.0000 | INHALATION_SPRAY | CUTANEOUS | Status: DC | PRN

## 2023-05-21 MED ORDER — ALTEPLASE 2 MG IJ SOLR
2.0000 mg | Freq: Once | INTRAMUSCULAR | Status: DC | PRN
Start: 2023-05-21 — End: 2023-05-21

## 2023-05-21 MED ORDER — LIDOCAINE HCL (PF) 1 % IJ SOLN
5.0000 mL | INTRAMUSCULAR | Status: DC | PRN
Start: 2023-05-21 — End: 2023-05-21

## 2023-05-21 MED ORDER — ANTICOAGULANT SODIUM CITRATE 4% (200MG/5ML) IV SOLN
5.0000 mL | Status: DC | PRN
Start: 2023-05-21 — End: 2023-05-21
  Administered 2023-05-21: 3.8 mL
  Filled 2023-05-21: qty 5

## 2023-05-21 NOTE — Progress Notes (Signed)
HD#1 SUBJECTIVE:  Patient Summary: Phillip Young is a 37 y.o. with a pertinent PMH of ESRD on MWF HD, HFrEF, chronic hypoxic respiratory failure on 5L [place], Afib on Eliquis who presented with missed dialysis and dyspnea and admitted for acute on chronic hypoxic respiratory failure.    Overnight Events: None.   Interim History: Pt seen in dialysis. No acute concerns. No dyspnea today. Foot pain is manageable. Agreeable to waiting for outpatient workup of foot drop. He reports some nausea since yesterday with non bloody emesis.  OBJECTIVE:  Vital Signs: Vitals:   05/21/23 1126 05/21/23 1144 05/21/23 1156 05/21/23 1222  BP: (!) 104/54 (!) 108/56  (!) 118/95  Pulse: 78 (!) 54  68  Resp: 14 (!) 21  18  Temp: 98.3 F (36.8 C)   97.7 F (36.5 C)  TempSrc: Oral     SpO2: 100% 98%  98%  Weight:   (!) 167.5 kg   Height:       Supplemental O2: Nasal Cannula SpO2: 98 % O2 Flow Rate (L/min): 5 L/min  Filed Weights   05/19/23 1558 05/21/23 0656 05/21/23 1156  Weight: (!) 173.5 kg (!) 173 kg (!) 167.5 kg     Intake/Output Summary (Last 24 hours) at 05/21/2023 1657 Last data filed at 05/21/2023 1500 Gross per 24 hour  Intake 123.8 ml  Output 5550 ml  Net -5426.2 ml   Net IO Since Admission: -9,351.2 mL [05/21/23 1657]  Physical Exam: Physical Exam Constitutional:      General: He is not in acute distress.    Appearance: Normal appearance. He is obese. He is not ill-appearing.  HENT:     Head: Normocephalic and atraumatic.  Cardiovascular:     Rate and Rhythm: Normal rate and regular rhythm.     Pulses: Normal pulses.  Pulmonary:     Effort: Pulmonary effort is normal. No respiratory distress.     Breath sounds: Normal breath sounds.  Abdominal:     Palpations: Abdomen is soft.     Tenderness: There is no abdominal tenderness. There is no guarding.  Musculoskeletal:     Right lower leg: Edema present.     Left lower leg: Edema present.     Comments: Bilateral edema is  improved from yesterday. 2+  Skin:    General: Skin is warm and dry.     Capillary Refill: Capillary refill takes less than 2 seconds.  Neurological:     General: No focal deficit present.     Mental Status: He is alert and oriented to person, place, and time.  Psychiatric:        Mood and Affect: Mood normal.        Behavior: Behavior normal.     Patient Lines/Drains/Airways Status     Active Line/Drains/Airways     Name Placement date Placement time Site Days   Hemodialysis Catheter Right Internal jugular Double lumen Permanent (Tunneled) 03/09/23  0933  Internal jugular  73   Pressure Injury 02/03/23 Coccyx Mid Stage 2 -  Partial thickness loss of dermis presenting as a shallow open injury with a red, pink wound bed without slough. 02/03/23  0800  -- 107   Pressure Injury 05/18/23 Buttocks Right Stage 3 -  Full thickness tissue loss. Subcutaneous fat may be visible but bone, tendon or muscle are NOT exposed. 05/18/23  2300  -- 3   Pressure Injury 05/18/23 Buttocks Left Stage 2 -  Partial thickness loss of dermis presenting as a shallow open  injury with a red, pink wound bed without slough. 05/18/23  2300  -- 3             ASSESSMENT/PLAN:  Assessment: Principal Problem:   Acute on chronic hypoxic respiratory failure ([name]) Active Problems:   ESRD (end stage renal disease) on dialysis ([name])   Congestive heart failure ([name])  Phillip Young is a 37 y.o. man with a pertinent PMH of ESRD on MWF HD, HFrEF, chronic hypoxic respiratory failure on 5L [place], Afib on Eliquis who presented with shortness of breath, and admitted for acute on chronic RF and volume overload due to missed dialysis.   Volume overload ESRD on HD MWF Chronic Hypotension Prior to admission, patient's last dialysis session was on 10/16, while the patient was still at [facility]. He was discharged to home on Friday, 10/18, but did not been able to attend dialysis since he is not able to get to his transportation -  he cannot ambulate or safely get himself into a car.   - Nephrology involved, thank you. HD today and then MWF schedule. He has consistently required midodrine for HD sessions.  - Midodrine 30 mg 3 times daily for hypotension - Gabapentin 300 mg 3 times daily after HD. - Fosrenol 2000 mg TID with meals - PT/ OT evaluated, considered rehab for this patient but not felt to be a candidate. SNF instead. - Social work is looking for SNF, thank you   Acute on chronic hypoxic respiratory failure (resolved) HFrEF Patient wears 5 L [place] at baseline but was having transient hypoxia while in the ED, likely in the setting of missed HD/volume overload. On exam, lungs are clear. He does have LE edema but his weight not elevated from previous admissions, feel that his HFrEF is grossly stable. Last echo done in July with EF 20-25%, severely decreased LV function, LVH, and LV diastolic function unable to be evaluated. He has been evaluated by cardiology, not a candidate for nodal ablation, ICD or pacemaker.  He is yet to follow-up with cardiology outpatient. - Digoxin 0.25 3 times weekly.  - Digoxin level normal - Pt not on GDMT but hypotension requiring midodrine is a limiting factor. Will continue to evaluate if there is a role here. - 5L [place] - HD as above   Chronic Afib on Anticoagulation Stable, Chronic anticoagulated with Eliquis 5 mg. He is asymptomatic. Refractory to cardioversion x 3 and, and not on amiodarone due to amiodarone toxicity. Beta blockers poor choice due to hypotension. - Continue Eliquis 5mg  every day. Was at reduced dose previously due to anal fissures, but it resolved. Will monitor. Pt agreeable to try full standard dosing.    Ambulatory Dysfunction Foot Drop R foot Pt with immense difficulty navigating the out-of-hospital environment due to deconditioning from his long prior hospitalization, body habitus, foot drop (R side, originated when he was sedated in ICU this summer), neuropathy.  He is unable to get to outpatient dialysis in part due to this, as well as general transportation challenges. Was not able to attend any outpatient dialysis sessions since leaving rehab on 10/18. - PT/OT involved, thank you. Current recs for SNF. Recommend OP evaluation of his foot drop.   Neuropathic pain Dry feet - Oxycodone 15 mg. - gabapentin 300 after dialysis per above - Urea lotion  - Lidocaine lotion  Pt medically stable and ready for discharge to SNF once bed become available.  Best Practice: Diet: Renal diet IVF: none VTE: Eliquis Code: Full AB: None Therapy Recs:  Pending DISPO: Anticipated discharge in 1-3 days to SNF pending placement.  Signature: [name], D.O.  Internal Medicine Resident, PGY-1 [facility]  Pager: [phone] 4:57 PM, 05/21/2023   Please contact the on call pager after 5 pm and on weekends at [phone].

## 2023-05-21 NOTE — Progress Notes (Signed)
Received patient in bed to unit.  Alert and oriented.  Informed consent signed and in chart.   TX duration: 4 hours  Patient tolerated well.  Transported back to the room  Alert, without acute distress.  Hand-off given to patient's nurse.   Access used: R internal jugular HD Cath Access issues: A-V and V-A  Total UF removed: 5.5L Medication(s) given: scheduled midodrine, oxycodone, see MAR   05/21/23 1126  Vitals  Temp 98.3 F (36.8 C)  Temp Source Oral  BP (!) 104/54  MAP (mmHg) 70  Pulse Rate 78  ECG Heart Rate 86  Resp 14  MEWS COLOR  MEWS Score Color Green  Oxygen Therapy  SpO2 100 %  O2 Device Nasal Cannula  O2 Flow Rate (L/min) 5 L/min  Pain Assessment  Pain Scale 0-10  Pain Score 6  Pain Type Chronic pain  Pain Location Leg  Pain Orientation Right;Left  Pain Descriptors / Indicators Aching;Discomfort  Pain Frequency Constant  Pain Onset On-going  Pain Intervention(s) Refused  MEWS Score  MEWS Temp 0  MEWS Systolic 0  MEWS Pulse 0  MEWS RR 0  MEWS LOC 0  MEWS Score 0     Stacie Glaze LPN [facility]

## 2023-05-21 NOTE — Plan of Care (Signed)
  Problem: Education: Goal: Knowledge of General Education information will improve Description Including pain rating scale, medication(s)/side effects and non-pharmacologic comfort measures Outcome: Progressing   

## 2023-05-21 NOTE — Progress Notes (Signed)
[facility] Progress Note   Subjective:  patient seen and examined on dialysis. Still with nausea. He wants his pee tested-he reports he's making more urine. Denies any hematuria/dysuria/discomfort He is inquiring about BUN and Cr  Objective Vitals:   05/21/23 0900 05/21/23 0930 05/21/23 1000 05/21/23 1030  BP: (!) 123/57 (!) 115/53 (!) 101/53 91/79  Pulse: 81 69 79 (!) 59  Resp: 19 19 15 17   Temp:      TempSrc:      SpO2: 100% 100% 98% 97%  Weight:      Height:       Physical Exam General: Overweight man, NAD. Nasal O2 in place (5L/min). Laying flat in bed Heart: RRR; no murmur Lungs: CTA anteriorly Abdomen: soft, obese Extremities: 1+ BLE edema with tenderness bilaterally(edema improving) Dialysis Access: TDC in R chest  Additional Objective Labs: Basic Metabolic Panel: Recent Labs  Lab 05/19/23 0451 05/20/23 1422 05/21/23 0710  NA 135 134* 135  K 5.5* 5.1 5.5*  CL 102 98 97*  CO2 21* 21* 26  GLUCOSE 85 81 73  BUN 46* 35* 38*  CREATININE 13.56* 9.60* 10.65*  CALCIUM 10.2 10.1 10.2  PHOS 6.0* 5.9* 6.7*   Liver Function Tests: Recent Labs  Lab 05/18/23 1729 05/19/23 0451 05/20/23 1422 05/21/23 0710  AST 41  --   --   --   ALT 26  --   --   --   ALKPHOS 149*  --   --   --   BILITOT 0.7  --   --   --   PROT 6.7  --   --   --   ALBUMIN 2.8* 2.4* 2.5* 2.6*   CBC: Recent Labs  Lab 05/18/23 1729 05/19/23 0451 05/20/23 1422 05/21/23 0711  WBC 7.2 5.5 5.6 5.8  NEUTROABS 4.7  --   --   --   HGB 9.3* 7.8* 8.3* 8.2*  HCT 30.3* 25.6* 26.4* 27.1*  MCV 105.2* 101.6* 100.0 101.5*  PLT 253 229 251 246   Studies/Results: No results found.  Medications:  anticoagulant sodium citrate      apixaban  5 mg Oral BID   Chlorhexidine Gluconate Cloth  6 each Topical Q0600   darbepoetin (ARANESP) injection - DIALYSIS  100 mcg Subcutaneous Q Sat-1800   digoxin  0.125 mg Oral Once per day on Monday Wednesday Friday   feeding supplement (NEPRO  CARB STEADY)  237 mL Oral BID BM   gabapentin  300 mg Oral Q M,W,F-HD   Gerhardt's butt cream   Topical TID   lanthanum  2,000 mg Oral TID WC   leptospermum manuka honey  1 Application Topical Daily   levothyroxine  25 mcg Oral Q0600   midodrine  30 mg Oral TID WC   sodium hypochlorite   Irrigation Daily   urea   Topical Daily    Dialysis Orders: MWF at [facility] 4.5hr, 400/800, EDW 172kg, 2K/2Ca bath, TDC, no heparin  Assessment/Plan: 1. Acute Hypoxic RF: In setting of missed HD due to transportation issues. Remains overloaded, UF as tolerated. 2. ESRD: Off usual MWF schedule this week, s/p HD yesterday with 4L off. Due to staffing issues, HD moved to today. Back on MWF sched on 10/28 if still here. Maximizing UF as tolerated. + HIT - no heparin. Will run a UA. Explained to him that we're not looking for renal recovery at this junction and the plan for HD still stands 3. HypoTN/volume: BP stable on high dose midodrine (30mg  TID),  lots of edema still, will need EDW lowered. 4. Anemia: Hgb 8.2 - on esa 5. Secondary hyperparathyroidism: CorrCa high, Phos high - continue lanthanum binders. No VDRA. Check PTH. 6. Nutrition: Alb low, adding supps. 7. HFrEF: uf as tolerated 8. Obesity  Anthony Sar, MD [facility]

## 2023-05-22 ENCOUNTER — Observation Stay (HOSPITAL_COMMUNITY): Payer: BC Managed Care – PPO

## 2023-05-22 DIAGNOSIS — R3129 Other microscopic hematuria: Secondary | ICD-10-CM | POA: Diagnosis not present

## 2023-05-22 LAB — BASIC METABOLIC PANEL
Anion gap: 13 (ref 5–15)
BUN: 23 mg/dL — ABNORMAL HIGH (ref 6–20)
CO2: 25 mmol/L (ref 22–32)
Calcium: 9.9 mg/dL (ref 8.9–10.3)
Chloride: 96 mmol/L — ABNORMAL LOW (ref 98–111)
Creatinine, Ser: 7.41 mg/dL — ABNORMAL HIGH (ref 0.61–1.24)
GFR, Estimated: 9 mL/min — ABNORMAL LOW (ref >60)
Glucose, Bld: 91 mg/dL (ref 70–99)
Potassium: 4.2 mmol/L (ref 3.5–5.1)
Sodium: 134 mmol/L — ABNORMAL LOW (ref 135–145)

## 2023-05-22 LAB — CBC
HCT: 26.6 % — ABNORMAL LOW (ref 39.0–52.0)
Hemoglobin: 8.3 g/dL — ABNORMAL LOW (ref 13.0–17.0)
MCH: 31.6 pg (ref 26.0–34.0)
MCHC: 31.2 g/dL (ref 30.0–36.0)
MCV: 101.1 fL — ABNORMAL HIGH (ref 80.0–100.0)
Platelets: 248 10*3/uL (ref 150–400)
RBC: 2.63 MIL/uL — ABNORMAL LOW (ref 4.22–5.81)
RDW: 15 % (ref 11.5–15.5)
WBC: 6.1 10*3/uL (ref 4.0–10.5)
nRBC: 0 % (ref 0.0–0.2)

## 2023-05-22 MED ORDER — DOCUSATE SODIUM 100 MG PO CAPS: 100.0000 mg | ORAL_CAPSULE | Freq: Every day | ORAL | Status: DC | PRN

## 2023-05-22 MED ORDER — ONDANSETRON HCL 4 MG/2ML IJ SOLN: 4.0000 mg | Freq: Four times a day (QID) | INTRAMUSCULAR | Status: DC

## 2023-05-22 MED ORDER — POLYETHYLENE GLYCOL 3350 17 G PO PACK: 17.0000 g | PACK | Freq: Every day | ORAL | Status: DC | PRN

## 2023-05-22 MED ORDER — ONDANSETRON HCL 4 MG/2ML IJ SOLN
4.0000 mg | Freq: Four times a day (QID) | INTRAMUSCULAR | Status: DC | PRN
  Administered 2023-05-22 – 2023-06-03 (×17): 4 mg via INTRAVENOUS
  Filled 2023-05-22 (×16): qty 2

## 2023-05-22 NOTE — Progress Notes (Signed)
[facility] Progress Note   Subjective:  Seen in room - no new symptoms today. Got full HD yesterday - 5.5L off. BP holding.   Objective Vitals:   05/21/23 1802 05/21/23 2119 05/22/23 0356 05/22/23 0939  BP: (!) 95/51 (!) 112/41 (!) 109/47 112/69  Pulse: 72 80 74 74  Resp: 20 14 16 20   Temp: 98.2 F (36.8 C) 98.1 F (36.7 C) 98.3 F (36.8 C)   TempSrc:  Oral Oral   SpO2: 100% 100% 98% 100%  Weight:      Height:       Physical Exam General: Overweight man, NAD. Nasal O2 in place. Heart: RRR; no murmur Lungs: CTA anteriorly Abdomen: soft, obese Extremities: Trace BLE edema with tenderness to palpation Dialysis Access: TDC in R chest    Additional Objective Labs: Basic Metabolic Panel: Recent Labs  Lab 05/19/23 0451 05/20/23 1422 05/21/23 0710 05/22/23 0501  NA 135 134* 135 134*  K 5.5* 5.1 5.5* 4.2  CL 102 98 97* 96*  CO2 21* 21* 26 25  GLUCOSE 85 81 73 91  BUN 46* 35* 38* 23*  CREATININE 13.56* 9.60* 10.65* 7.41*  CALCIUM 10.2 10.1 10.2 9.9  PHOS 6.0* 5.9* 6.7*  --    Liver Function Tests: Recent Labs  Lab 05/18/23 1729 05/19/23 0451 05/20/23 1422 05/21/23 0710  AST 41  --   --   --   ALT 26  --   --   --   ALKPHOS 149*  --   --   --   BILITOT 0.7  --   --   --   PROT 6.7  --   --   --   ALBUMIN 2.8* 2.4* 2.5* 2.6*   CBC: Recent Labs  Lab 05/18/23 1729 05/19/23 0451 05/20/23 1422 05/21/23 0711 05/22/23 0501  WBC 7.2 5.5 5.6 5.8 6.1  NEUTROABS 4.7  --   --   --   --   HGB 9.3* 7.8* 8.3* 8.2* 8.3*  HCT 30.3* 25.6* 26.4* 27.1* 26.6*  MCV 105.2* 101.6* 100.0 101.5* 101.1*  PLT 253 229 251 246 248   Iron Studies:  Recent Labs    05/19/23 2020  IRON 64  TIBC 211*  FERRITIN 359*   Medications:   apixaban  5 mg Oral BID   Chlorhexidine Gluconate Cloth  6 each Topical Q0600   darbepoetin (ARANESP) injection - DIALYSIS  100 mcg Subcutaneous Q Sat-1800   digoxin  0.125 mg Oral Once per day on Monday Wednesday Friday    feeding supplement (NEPRO CARB STEADY)  237 mL Oral BID BM   gabapentin  300 mg Oral Q M,W,F-HD   Gerhardt's butt cream   Topical TID   lanthanum  2,000 mg Oral TID WC   leptospermum manuka honey  1 Application Topical Daily   levothyroxine  25 mcg Oral Q0600   midodrine  30 mg Oral TID WC   urea   Topical Daily   Dialysis Orders: MWF at [facility] 4.5hr, 400/800, EDW 172kg, 2K/2Ca bath, TDC, no heparin (+ HIT)   Assessment/Plan: 1. Acute Hypoxic RF: In setting of missed HD due to transportation issues. Improving. 2. ESRD: Off usual MWF schedule this week - last HD 10/26. Back on MWF - next HD tomorrow. Maximizing UF as tolerated. + HIT - no heparin.  3. HypoTN/volume: BP stable on high dose midodrine (30mg  TID), lots of edema still, will need EDW lowered. 4. Anemia: Hgb 8.3 - continue Aranesp q Sat while  here. 5. Secondary hyperparathyroidism: CorrCa high, Phos high - continue lanthanum binders. No VDRA. Check PTH. 6. Nutrition: Alb low, continue supps. 7. HFrEF: On digoxin + Eliquis. 8. Obesity  Ozzie Hoyle, PA-C 05/22/2023, 10:01 AM  [employer]

## 2023-05-22 NOTE — Progress Notes (Signed)
HD#1 SUBJECTIVE:  Patient Summary: Phillip Young is a 37 y.o. with a pertinent PMH of ESRD on MWF HD, HFrEF, chronic hypoxic respiratory failure on 5L [place], Afib on Eliquis who presented with missed dialysis and dyspnea and admitted for acute on chronic hypoxic respiratory failure.   Overnight Events: None  Interim History: He is well overall this morning. He does report some nausea and hard stools that occur every 2-3 days but does not feel constipated. Denies abdominal pain. Denies dyspnea. Denies blood in stool.  OBJECTIVE:  Vital Signs: Vitals:   05/21/23 1222 05/21/23 1802 05/21/23 2119 05/22/23 0356  BP: (!) 118/95 (!) 95/51 (!) 112/41 (!) 109/47  Pulse: 68 72 80 74  Resp: 18 20 14 16   Temp: 97.7 F (36.5 C) 98.2 F (36.8 C) 98.1 F (36.7 C) 98.3 F (36.8 C)  TempSrc:   Oral Oral  SpO2: 98% 100% 100% 98%  Weight:      Height:       Supplemental O2: Nasal Cannula SpO2: 98 % O2 Flow Rate (L/min): 5 L/min  Filed Weights   05/19/23 1558 05/21/23 0656 05/21/23 1156  Weight: (!) 173.5 kg (!) 173 kg (!) 167.5 kg     Intake/Output Summary (Last 24 hours) at 05/22/2023 9563 Last data filed at 05/21/2023 2120 Gross per 24 hour  Intake 243.8 ml  Output 5550 ml  Net -5306.2 ml   Net IO Since Admission: -9,161.2 mL [05/22/23 0939]  Physical Exam: Physical Exam Constitutional:      General: He is not in acute distress.    Appearance: Normal appearance. He is obese. He is not ill-appearing.  HENT:     Head: Normocephalic and atraumatic.  Cardiovascular:     Rate and Rhythm: Normal rate and regular rhythm.     Pulses: Normal pulses.  Pulmonary:     Effort: Pulmonary effort is normal.     Breath sounds: Normal breath sounds.  Abdominal:     General: There is no distension.     Palpations: Abdomen is soft.     Tenderness: There is no abdominal tenderness.  Musculoskeletal:     Right lower leg: Edema present.     Left lower leg: Edema present.     Comments:  Bilateral LE edema improved from prior days. 1+  Neurological:     General: No focal deficit present.     Mental Status: He is alert and oriented to person, place, and time.  Psychiatric:        Mood and Affect: Mood normal.        Behavior: Behavior normal.     Patient Lines/Drains/Airways Status     Active Line/Drains/Airways     Name Placement date Placement time Site Days   Peripheral IV 05/21/23 22 G 1" Anterior;Left Forearm 05/21/23  2220  Forearm  1   Hemodialysis Catheter Right Internal jugular Double lumen Permanent (Tunneled) 03/09/23  0933  Internal jugular  74   Pressure Injury 02/03/23 Coccyx Mid Stage 2 -  Partial thickness loss of dermis presenting as a shallow open injury with a red, pink wound bed without slough. 02/03/23  0800  -- 108   Pressure Injury 05/18/23 Buttocks Right Stage 3 -  Full thickness tissue loss. Subcutaneous fat may be visible but bone, tendon or muscle are NOT exposed. 05/18/23  2300  -- 4   Pressure Injury 05/18/23 Buttocks Left Stage 2 -  Partial thickness loss of dermis presenting as a shallow open injury with a  red, pink wound bed without slough. 05/18/23  2300  -- 4             ASSESSMENT/PLAN:  Assessment: Principal Problem:   Acute on chronic hypoxic respiratory failure ([name]) Active Problems:   ESRD (end stage renal disease) on dialysis ([name])   Congestive heart failure ([name])   Phillip Young is a 37 y.o. man with a pertinent PMH of ESRD on MWF HD, HFrEF, chronic hypoxic respiratory failure on 5L [place], Afib on Eliquis who presented with shortness of breath, and admitted for acute on chronic RF and volume overload due to missed dialysis.   Volume overload ESRD on HD MWF Chronic Hypotension Prior to admission, patient's last dialysis session was on 10/16, while the patient was still at [facility]. He was discharged to home on Friday, 10/18, but did not been able to attend dialysis since he is not able to get to his transportation - he  cannot ambulate or safely get himself into a car. He cannot walk up stairs and his apartment is at a ground/basement level requiring him to travel around the back and up a hill which is a significant challenge for him and his helpers. - Nephrology involved, thank you. HD on MWF schedule. He has consistently required midodrine for HD sessions.  - Midodrine 30 mg 3 times daily for hypotension - Gabapentin 300 mg 3 times daily after HD. - Fosrenol 2000 mg TID with meals - PT/ OT evaluated, considered rehab for this patient but not felt to be a candidate. SNF instead. - Social work is looking for SNF, thank you   Acute on chronic hypoxic respiratory failure (resolved) HFrEF Patient wears 5 L [place] at baseline but was having transient hypoxia while in the ED, likely in the setting of missed HD/volume overload. On exam, lungs are clear. He does have LE edema that is slowly improving and his weight not elevated from previous admissions, feel that his HFrEF is grossly stable. Last echo done in July with EF 20-25%, severely decreased LV function, LVH, and LV diastolic function unable to be evaluated. He has been evaluated by cardiology, not a candidate for nodal ablation, ICD or pacemaker.  He is yet to follow-up with cardiology outpatient. - Digoxin 0.25 3 times weekly.  - Pt not on GDMT but hypotension requiring midodrine is a limiting factor. Will continue to evaluate if there is a role here. - 5L [place] is baseline, remains there   Chronic Afib on Anticoagulation Stable, Chronic anticoagulated with Eliquis 5 mg. He is asymptomatic. Refractory to cardioversion x 3 and, and not on amiodarone due to amiodarone toxicity. Beta blockers poor choice due to hypotension. - Continue Eliquis 5mg  every day. Was at reduced dose previously due to anal fissures, but it resolved. Will monitor. Pt agreeable to try full standard dosing.    Ambulatory Dysfunction Foot Drop R foot Pt with immense difficulty navigating the  out-of-hospital environment due to deconditioning from his long prior hospitalization, body habitus, foot drop (R side, originated when he was sedated in ICU this summer), neuropathy. He is unable to get to outpatient dialysis in part due to this, as well as general transportation challenges. Was not able to attend any outpatient dialysis sessions since leaving rehab on 10/18. - PT/OT involved, thank you. Current recs for SNF. Recommend OP evaluation of his foot drop.   Neuropathic pain Dry feet - Oxycodone 15 mg. - gabapentin 300 after dialysis per above - Urea lotion  - Lidocaine lotion - Trial  leg wraps  Constipation Nausea Pt with mild nausea x 2 days that is relieved with zofran. Some earlier emesis that is improving. No abdominal pain. Vitals stable. He does report hard stools that occur only every second to third day but does not want to trial stools softeners or laxitives. Will monitor.   Pt medically stable and ready for discharge to SNF once bed become available.   Best Practice: Diet: Renal diet IVF: none VTE: Eliquis Code: Full AB: None Therapy Recs: Pending DISPO: Anticipated discharge in 1-5 days to SNF pending placement.  Signature: [name], D.O.  Internal Medicine Resident, PGY-1 [facility]  Pager: [phone] 9:39 AM, 05/22/2023   Please contact the on call pager after 5 pm and on weekends at [phone].

## 2023-05-23 DIAGNOSIS — J9621 Acute and chronic respiratory failure with hypoxia: Secondary | ICD-10-CM | POA: Diagnosis not present

## 2023-05-23 LAB — BASIC METABOLIC PANEL
Anion gap: 15 (ref 5–15)
BUN: 25 mg/dL — ABNORMAL HIGH (ref 6–20)
CO2: 26 mmol/L (ref 22–32)
Calcium: 11.1 mg/dL — ABNORMAL HIGH (ref 8.9–10.3)
Chloride: 94 mmol/L — ABNORMAL LOW (ref 98–111)
Creatinine, Ser: 8.88 mg/dL — ABNORMAL HIGH (ref 0.61–1.24)
GFR, Estimated: 7 mL/min — ABNORMAL LOW (ref >60)
Glucose, Bld: 78 mg/dL (ref 70–99)
Potassium: 4.3 mmol/L (ref 3.5–5.1)
Sodium: 135 mmol/L (ref 135–145)

## 2023-05-23 LAB — CBC
HCT: 34.3 % — ABNORMAL LOW (ref 39.0–52.0)
Hemoglobin: 10.2 g/dL — ABNORMAL LOW (ref 13.0–17.0)
MCH: 30.2 pg (ref 26.0–34.0)
MCHC: 29.7 g/dL — ABNORMAL LOW (ref 30.0–36.0)
MCV: 101.5 fL — ABNORMAL HIGH (ref 80.0–100.0)
Platelets: 290 10*3/uL (ref 150–400)
RBC: 3.38 MIL/uL — ABNORMAL LOW (ref 4.22–5.81)
RDW: 15.3 % (ref 11.5–15.5)
WBC: 5.4 10*3/uL (ref 4.0–10.5)
nRBC: 0 % (ref 0.0–0.2)

## 2023-05-23 MED ORDER — LIDOCAINE HCL (PF) 1 % IJ SOLN: 5.0000 mL | INTRAMUSCULAR | Status: DC | PRN

## 2023-05-23 MED ORDER — PENTAFLUOROPROP-TETRAFLUOROETH EX AERO: 1.0000 | INHALATION_SPRAY | CUTANEOUS | Status: DC | PRN

## 2023-05-23 MED ORDER — ALTEPLASE 2 MG IJ SOLR: 2.0000 mg | Freq: Once | INTRAMUSCULAR | Status: DC | PRN

## 2023-05-23 MED ORDER — ANTICOAGULANT SODIUM CITRATE 4% (200MG/5ML) IV SOLN
5.0000 mL | Status: DC | PRN
  Administered 2023-05-23: 5 mL
  Filled 2023-05-23: qty 5

## 2023-05-23 MED ORDER — LIDOCAINE-PRILOCAINE 2.5-2.5 % EX CREA: 1.0000 | TOPICAL_CREAM | CUTANEOUS | Status: DC | PRN

## 2023-05-23 NOTE — Progress Notes (Signed)
HD#1 SUBJECTIVE:  Patient Summary: Phillip Young is a 37 y.o. with a pertinent PMH of ESRD on MWF HD, HFrEF, chronic hypoxic respiratory failure on 5L [place], Afib on Eliquis who presented with missed dialysis and dyspnea and admitted for acute on chronic hypoxic respiratory failure.   Overnight Events: None  Interim History: Still some nausea, emesis once. No abdominal pain. Likes taking Zofran. Does not want stool softeners. Overall feels well.  OBJECTIVE:  Vital Signs: Vitals:   05/23/23 0934 05/23/23 0943 05/23/23 1000 05/23/23 1030  BP: (!) 104/58 114/73 100/69 101/67  Pulse: 80 80 (!) 146 77  Resp: 16 15 (!) 23 20  Temp:      TempSrc:      SpO2: 100% 100% 93% 98%  Weight:      Height:       Supplemental O2: Nasal Cannula SpO2: 98 % O2 Flow Rate (L/min): 4 L/min  Filed Weights   05/21/23 0656 05/21/23 1156 05/23/23 0850  Weight: (!) 173 kg (!) 167.5 kg (!) 166.2 kg     Intake/Output Summary (Last 24 hours) at 05/23/2023 1041 Last data filed at 05/23/2023 0900 Gross per 24 hour  Intake 660 ml  Output 50 ml  Net 610 ml   Net IO Since Admission: -8,431.2 mL [05/23/23 1041]  Physical Exam: Physical Exam Constitutional:      General: He is not in acute distress.    Appearance: Normal appearance. He is obese. He is not ill-appearing.  HENT:     Head: Normocephalic and atraumatic.  Cardiovascular:     Rate and Rhythm: Normal rate and regular rhythm.     Pulses: Normal pulses.  Pulmonary:     Effort: Pulmonary effort is normal.     Breath sounds: Normal breath sounds.  Abdominal:     General: Abdomen is flat.     Palpations: Abdomen is soft.     Tenderness: There is no abdominal tenderness.  Musculoskeletal:     Right lower leg: Edema present.     Left lower leg: Edema present.  Skin:    General: Skin is warm and dry.     Capillary Refill: Capillary refill takes less than 2 seconds.  Neurological:     General: No focal deficit present.     Mental Status:  He is alert and oriented to person, place, and time.  Psychiatric:        Mood and Affect: Mood normal.        Behavior: Behavior normal.     Patient Lines/Drains/Airways Status     Active Line/Drains/Airways     Name Placement date Placement time Site Days   Peripheral IV 05/21/23 22 G 1" Anterior;Left Forearm 05/21/23  2220  Forearm  2   Hemodialysis Catheter Right Internal jugular Double lumen Permanent (Tunneled) 03/09/23  0933  Internal jugular  75   Pressure Injury 02/03/23 Coccyx Mid Stage 2 -  Partial thickness loss of dermis presenting as a shallow open injury with a red, pink wound bed without slough. 02/03/23  0800  -- 109   Pressure Injury 05/18/23 Buttocks Right Stage 3 -  Full thickness tissue loss. Subcutaneous fat may be visible but bone, tendon or muscle are NOT exposed. 05/18/23  2300  -- 5   Pressure Injury 05/18/23 Buttocks Left Stage 2 -  Partial thickness loss of dermis presenting as a shallow open injury with a red, pink wound bed without slough. 05/18/23  2300  -- 5  ASSESSMENT/PLAN:  Assessment: Principal Problem:   Acute on chronic hypoxic respiratory failure ([name]) Active Problems:   ESRD (end stage renal disease) on dialysis ([name])   Congestive heart failure ([name])   Phillip Young is a 37 y.o. man with a pertinent PMH of ESRD on MWF HD, HFrEF, chronic hypoxic respiratory failure on 5L [place], Afib on Eliquis who presented with shortness of breath, and admitted for acute on chronic RF and volume overload due to missed dialysis.   This patient is medically stable for discharge, pending placement at a SNF.  Volume overload ESRD on HD MWF Chronic Hypotension Pt stable, weight is down, and attending dialysis without problems.  - Continue HD on MWF.  - Midodrine 30 mg 3 times daily for hypotension at HD - Gabapentin 300 mg 3 times daily after HD for neuropathy - Fosrenol 2000 mg TID with meals - PT/ OT evaluated, rehab not an option. SNF  instead. - Social work is looking for SNF, thank you  Barriers to dialysis as outpatient: After discharge from previous long hospital/rehab stay, pt was unable attend dialysis multiple times as unable to get to his transportation - he cannot ambulate or safely get himself into a car. He cannot walk up stairs and his apartment is at a ground/basement level requiring him to travel around the back and up a hill which is a significant challenge for him and his helpers.   Acute on chronic hypoxic respiratory failure (resolved) HFrEF Patient wears 5 L [place] at baseline, usually between 4-5L while here. Some transient hypoxia at admission long resolved. Lungs remain clear. He does have LE edema that is slowly improving and his weight is going down - Digoxin 0.25 3 times weekly.  - Pt not on GDMT but hypotension requiring midodrine is a limiting factor. Will continue to evaluate if there is a role here. - 5L [place] is baseline, remains there  Last echo done in July with EF 20-25%, severely decreased LV function, LVH, and LV diastolic function unable to be evaluated. He has been evaluated by cardiology, not a candidate for nodal ablation, ICD or pacemaker.  He is yet to follow-up with cardiology outpatient.   Chronic Afib on Anticoagulation Stable, Chronic anticoagulated with Eliquis 5 mg. He is asymptomatic. Refractory to cardioversion x 3 and, and not on amiodarone due to amiodarone toxicity. Beta blockers poor choice due to hypotension. - Continue Eliquis 5mg  every day. Was at reduced dose previously due to anal fissures, but it resolved. Will monitor. Pt agreeable to try full standard dosing.    Ambulatory Dysfunction Foot Drop R foot Pt with immense difficulty navigating the out-of-hospital environment due to deconditioning from his long prior hospitalization, body habitus, foot drop (R side, originated when he was sedated in ICU this summer), neuropathy. He is unable to get to outpatient dialysis in part  due to this, as well as general transportation challenges. Was not able to attend any outpatient dialysis sessions since leaving rehab on 10/18. - PT/OT involved, thank you. Current recs for SNF. Recommend OP evaluation of his foot drop.   Neuropathic pain Dry feet - Oxycodone 15 mg. - gabapentin 300 after dialysis per above - Urea lotion  - Lidocaine lotion - Trial leg wraps   Constipation Nausea Pt with mild nausea x 2 days that is relieved with zofran. Some earlier emesis that is improving. No abdominal pain. Vitals stable. He does report hard stools that occur only every second to third day but does not want to  trial stools softeners or laxitives. Will monitor.   Pt medically stable and ready for discharge to SNF once bed become available.  Best Practice: Diet: Renal diet IVF: none VTE: Eliquis Code: Full AB: None Therapy Recs: Pending DISPO: Anticipated discharge in 1-5 days to SNF pending placement.  Signature: [name], D.O.  Internal Medicine Resident, PGY-1 [facility]  Pager: [phone] 10:41 AM, 05/23/2023   Please contact the on call pager after 5 pm and on weekends at [phone].

## 2023-05-23 NOTE — Progress Notes (Signed)
OT Cancellation Note  Patient Details Name: Phillip Young MRN: [id number] DOB: [birth year]   Cancelled Treatment:    Reason Eval/Treat Not Completed: Patient at procedure or test/ unavailable (HD)  Mateo Flow 05/23/2023, 8:54 AM

## 2023-05-23 NOTE — Progress Notes (Signed)
[facility] Progress Note   Subjective:    Seen and examined patient on HD. Tolerating UFG 5L. BP is 108/55. No acute issues. Renal US completed yesterday.  Objective Vitals:   05/23/23 1000 05/23/23 1030 05/23/23 1100 05/23/23 1130  BP: 100/69 101/67 (!) 108/58 108/63  Pulse: (!) 146 77 (!) 126 62  Resp: (!) 23 20 12 18   Temp:      TempSrc:      SpO2: 93% 98% 98% 99%  Weight:      Height:       Physical Exam General: Overweight man, NAD. Nasal O2 in place. Heart: RRR; no murmur Lungs: CTA anteriorly Abdomen: soft, obese Extremities: Trace BLE edema with tenderness to palpation Dialysis Access: [facility] in R chest  Filed Weights   05/21/23 0656 05/21/23 1156 05/23/23 0850  Weight: (!) 173 kg (!) 167.5 kg (!) 166.2 kg    Intake/Output Summary (Last 24 hours) at 05/23/2023 1152 Last data filed at 05/23/2023 0900 Gross per 24 hour  Intake 660 ml  Output 50 ml  Net 610 ml    Additional Objective Labs: Basic Metabolic Panel: Recent Labs  Lab 05/19/23 0451 05/20/23 1422 05/21/23 0710 05/22/23 0501 05/23/23 0446  NA 135 134* 135 134* 135  K 5.5* 5.1 5.5* 4.2 4.3  CL 102 98 97* 96* 94*  CO2 21* 21* 26 25 26   GLUCOSE 85 81 73 91 78  BUN 46* 35* 38* 23* 25*  CREATININE 13.56* 9.60* 10.65* 7.41* 8.88*  CALCIUM 10.2 10.1 10.2 9.9 11.1*  PHOS 6.0* 5.9* 6.7*  --   --    Liver Function Tests: Recent Labs  Lab 05/18/23 1729 05/19/23 0451 05/20/23 1422 05/21/23 0710  AST 41  --   --   --   ALT 26  --   --   --   ALKPHOS 149*  --   --   --   BILITOT 0.7  --   --   --   PROT 6.7  --   --   --   ALBUMIN 2.8* 2.4* 2.5* 2.6*   No results for input(s): "LIPASE", "AMYLASE" in the last 168 hours. CBC: Recent Labs  Lab 05/18/23 1729 05/19/23 0451 05/20/23 1422 05/21/23 0711 05/22/23 0501 05/23/23 0446  WBC 7.2 5.5 5.6 5.8 6.1 5.4  NEUTROABS 4.7  --   --   --   --   --   HGB 9.3* 7.8* 8.3* 8.2* 8.3* 10.2*  HCT 30.3* 25.6* 26.4* 27.1* 26.6* 34.3*   MCV 105.2* 101.6* 100.0 101.5* 101.1* 101.5*  PLT 253 229 251 246 248 290   Blood Culture    Component Value Date/Time   SDES BLOOD RIGHT HAND 03/11/2023 0718   SPECREQUEST  03/11/2023 0718    BOTTLES DRAWN AEROBIC AND ANAEROBIC Blood Culture adequate volume   CULT  03/11/2023 0718    NO GROWTH 5 DAYS Performed at [facility], [address] [place], [place] [address]    REPTSTATUS 03/16/2023 FINAL 03/11/2023 5366    Cardiac Enzymes: No results for input(s): "CKTOTAL", "CKMB", "CKMBINDEX", "TROPONINI" in the last 168 hours. CBG: No results for input(s): "GLUCAP" in the last 168 hours. Iron Studies: No results for input(s): "IRON", "TIBC", "TRANSFERRIN", "FERRITIN" in the last 72 hours. Lab Results  Component Value Date   INR 2.0 (H) 06/10/2022   INR 2.8 (H) 06/09/2022   INR 2.9 (H) 06/07/2022   Studies/Results: US RENAL  Result Date: 05/22/2023 CLINICAL DATA:  230123 Microscopic  hematuria 230123 EXAM: RENAL / URINARY TRACT ULTRASOUND COMPLETE COMPARISON:  January 23, 2023. FINDINGS: Right Kidney: Renal measurements: 10.3 x 4.5 x 5.8 cm = volume: 140 mL. Echogenicity within normal limits. No mass or hydronephrosis visualized. Left Kidney: Renal measurements: 11.5 x 5.9 x 3.9 cm = volume: 139 mL. Echogenicity within normal limits. No mass or hydronephrosis visualized. Bladder: Bladder is completely decompressed. Other: Increased hepatic echogenicity. IMPRESSION: 1. No hydronephrosis. 2. Increased hepatic echogenicity as can be seen in hepatic steatosis. Electronically Signed   By: Meda Klinefelter M.D.   On: 05/22/2023 12:08    Medications:  anticoagulant sodium citrate      apixaban  5 mg Oral BID   Chlorhexidine Gluconate Cloth  6 each Topical Q0600   darbepoetin (ARANESP) injection - DIALYSIS  100 mcg Subcutaneous Q Sat-1800   digoxin  0.125 mg Oral Once per day on Monday Wednesday Friday   feeding supplement (NEPRO CARB STEADY)  237 mL Oral BID BM   gabapentin  300  mg Oral Q M,W,F-HD   Gerhardt's butt cream   Topical TID   lanthanum  2,000 mg Oral TID WC   leptospermum manuka honey  1 Application Topical Daily   levothyroxine  25 mcg Oral Q0600   midodrine  30 mg Oral TID WC   urea   Topical Daily    Dialysis Orders: MWF at [facility] 4.5hr, 400/800, EDW 172kg, 2K/2Ca bath, TDC, no heparin (+ HIT)  Assessment/Plan: 1. Acute Hypoxic RF: In setting of missed HD due to transportation issues. Improving. 2. ESRD: Off usual MWF schedule this week - last HD 10/26. Back on MWF - on HD. Maximizing UF as tolerated. + HIT - no heparin. Renal US showed no hydronephrosis and increasing echogenicity consistent with hepatic steatosis. 3. HypoTN/volume: BP stable on high dose midodrine (30mg  TID), lots of edema still, will need EDW lowered. 4. Anemia: Hgb 8.3 - continue Aranesp q Sat while here. 5. Secondary hyperparathyroidism: CorrCa high, Phos high - continue lanthanum binders. No VDRA. Check PTH. 6. Nutrition: Alb low, continue supps. 7. HFrEF: On digoxin + Eliquis. 8. Obesity  Salome Holmes, NP [facility] 05/23/2023,11:52 AM  LOS: 1 day

## 2023-05-23 NOTE — Progress Notes (Signed)
PT Cancellation Note  Patient Details Name: Phillip Young MRN: [id number] DOB: 1985-12-23   Cancelled Treatment:    Reason Eval/Treat Not Completed: (P) Patient at procedure or test/unavailable (Pt off unit with attempt 10am. Reattempt 2pm, pt still not back from HD unit.) Will continue efforts per PT plan of care as schedule permits.  [name] 05/23/2023, 2:02 PM

## 2023-05-23 NOTE — Progress Notes (Signed)
Received patient in bed to unit.  Alert and oriented.  Informed consent signed and in chart.   TX duration:4  Patient tolerated well.  Transported back to the room  Alert, without acute distress.  Hand-off given to patient's nurse.   Access used: right HD catheter  Access issues: none  Total UF removed: 5L Medication(s) given: none   05/23/23 1332  Vitals  Temp 98 F (36.7 C)  Temp Source Oral  BP 119/67  MAP (mmHg) 83  BP Location Right Wrist  BP Method Automatic  Patient Position (if appropriate) Lying  Pulse Rate (!) 107  Pulse Rate Source Monitor  ECG Heart Rate 99  Resp 18  Oxygen Therapy  SpO2 100 %  O2 Device Nasal Cannula  O2 Flow Rate (L/min) 4 L/min  During Treatment Monitoring  Blood Flow Rate (mL/min) 400 mL/min  Arterial Pressure (mmHg) -240.39 mmHg  Venous Pressure (mmHg) 178.78 mmHg  TMP (mmHg) 28.89 mmHg  Ultrafiltration Rate (mL/min) 1727 mL/min  Dialysate Flow Rate (mL/min) 299 ml/min  Dialysate Potassium Concentration 3  Dialysate Calcium Concentration 2.5  Duration of HD Treatment -hour(s) 4 hour(s)  Cumulative Fluid Removed (mL) per Treatment  5000.37  HD Safety Checks Performed Yes  Intra-Hemodialysis Comments Tx completed  Dialysis Fluid Bolus Normal Saline  Bolus Amount (mL) 300 mL      Nayellie Sanseverino S [facility]

## 2023-05-23 NOTE — Plan of Care (Signed)

## 2023-05-24 ENCOUNTER — Observation Stay (HOSPITAL_COMMUNITY): Payer: BC Managed Care – PPO

## 2023-05-24 DIAGNOSIS — T80211A Bloodstream infection due to central venous catheter, initial encounter: Secondary | ICD-10-CM | POA: Diagnosis not present

## 2023-05-24 DIAGNOSIS — J9621 Acute and chronic respiratory failure with hypoxia: Secondary | ICD-10-CM | POA: Diagnosis not present

## 2023-05-24 DIAGNOSIS — A4101 Sepsis due to Methicillin susceptible Staphylococcus aureus: Secondary | ICD-10-CM | POA: Diagnosis not present

## 2023-05-24 DIAGNOSIS — L89313 Pressure ulcer of right buttock, stage 3: Secondary | ICD-10-CM | POA: Diagnosis not present

## 2023-05-24 DIAGNOSIS — R0600 Dyspnea, unspecified: Secondary | ICD-10-CM | POA: Diagnosis not present

## 2023-05-24 DIAGNOSIS — I4891 Unspecified atrial fibrillation: Secondary | ICD-10-CM | POA: Diagnosis not present

## 2023-05-24 DIAGNOSIS — Z4682 Encounter for fitting and adjustment of non-vascular catheter: Secondary | ICD-10-CM | POA: Diagnosis not present

## 2023-05-24 LAB — RENAL FUNCTION PANEL
Albumin: 3 g/dL — ABNORMAL LOW (ref 3.5–5.0)
Anion gap: 13 (ref 5–15)
BUN: 17 mg/dL (ref 6–20)
CO2: 24 mmol/L (ref 22–32)
Calcium: 10.4 mg/dL — ABNORMAL HIGH (ref 8.9–10.3)
Chloride: 98 mmol/L (ref 98–111)
Creatinine, Ser: 7.74 mg/dL — ABNORMAL HIGH (ref 0.61–1.24)
GFR, Estimated: 8 mL/min — ABNORMAL LOW (ref >60)
Glucose, Bld: 119 mg/dL — ABNORMAL HIGH (ref 70–99)
Phosphorus: 3.1 mg/dL (ref 2.5–4.6)
Potassium: 4.5 mmol/L (ref 3.5–5.1)
Sodium: 135 mmol/L (ref 135–145)

## 2023-05-24 LAB — CBC WITH DIFFERENTIAL/PLATELET
Abs Immature Granulocytes: 0.04 10*3/uL (ref 0.00–0.07)
Basophils Absolute: 0 10*3/uL (ref 0.0–0.1)
Basophils Relative: 0 %
Eosinophils Absolute: 0.1 10*3/uL (ref 0.0–0.5)
Eosinophils Relative: 1 %
HCT: 31.3 % — ABNORMAL LOW (ref 39.0–52.0)
Hemoglobin: 9.6 g/dL — ABNORMAL LOW (ref 13.0–17.0)
Immature Granulocytes: 0 %
Lymphocytes Relative: 3 %
Lymphs Abs: 0.3 10*3/uL — ABNORMAL LOW (ref 0.7–4.0)
MCH: 30.9 pg (ref 26.0–34.0)
MCHC: 30.7 g/dL (ref 30.0–36.0)
MCV: 100.6 fL — ABNORMAL HIGH (ref 80.0–100.0)
Monocytes Absolute: 0.4 10*3/uL (ref 0.1–1.0)
Monocytes Relative: 4 %
Neutro Abs: 9.7 10*3/uL — ABNORMAL HIGH (ref 1.7–7.7)
Neutrophils Relative %: 92 %
Platelets: 240 10*3/uL (ref 150–400)
RBC: 3.11 MIL/uL — ABNORMAL LOW (ref 4.22–5.81)
RDW: 15.6 % — ABNORMAL HIGH (ref 11.5–15.5)
WBC: 10.6 10*3/uL — ABNORMAL HIGH (ref 4.0–10.5)
nRBC: 0 % (ref 0.0–0.2)

## 2023-05-24 LAB — BASIC METABOLIC PANEL
Anion gap: 14 (ref 5–15)
BUN: 14 mg/dL (ref 6–20)
CO2: 24 mmol/L (ref 22–32)
Calcium: 10.5 mg/dL — ABNORMAL HIGH (ref 8.9–10.3)
Chloride: 97 mmol/L — ABNORMAL LOW (ref 98–111)
Creatinine, Ser: 6.71 mg/dL — ABNORMAL HIGH (ref 0.61–1.24)
GFR, Estimated: 10 mL/min — ABNORMAL LOW (ref >60)
Glucose, Bld: 87 mg/dL (ref 70–99)
Potassium: 4.2 mmol/L (ref 3.5–5.1)
Sodium: 135 mmol/L (ref 135–145)

## 2023-05-24 LAB — LACTIC ACID, PLASMA
Lactic Acid, Venous: 3.1 mmol/L (ref 0.5–1.9)
Lactic Acid, Venous: 4 mmol/L (ref 0.5–1.9)

## 2023-05-24 MED ORDER — VANCOMYCIN HCL IN DEXTROSE 1-5 GM/200ML-% IV SOLN
1000.0000 mg | INTRAVENOUS | Status: AC
  Administered 2023-05-24 (×2): 1000 mg via INTRAVENOUS
  Filled 2023-05-24 (×2): qty 200

## 2023-05-24 MED ORDER — LACTATED RINGERS IV BOLUS
500.0000 mL | Freq: Once | INTRAVENOUS | Status: AC
  Administered 2023-05-24: 500 mL via INTRAVENOUS

## 2023-05-24 MED ORDER — VANCOMYCIN HCL IN DEXTROSE 1-5 GM/200ML-% IV SOLN: 1000.0000 mg | INTRAVENOUS | Status: DC

## 2023-05-24 MED ORDER — GERHARDT'S BUTT CREAM: 1.0000 | TOPICAL_CREAM | Freq: Three times a day (TID) | CUTANEOUS | 0 refills | Status: DC

## 2023-05-24 MED ORDER — OXYCODONE HCL 15 MG PO TABS: 15.0000 mg | ORAL_TABLET | ORAL | 0 refills | Status: AC | PRN

## 2023-05-24 MED ORDER — SODIUM CHLORIDE 0.9 % IV SOLN
1.0000 g | INTRAVENOUS | Status: DC
  Administered 2023-05-24: 1 g via INTRAVENOUS
  Filled 2023-05-24 (×2): qty 10

## 2023-05-24 MED ORDER — CHLORHEXIDINE GLUCONATE CLOTH 2 % EX PADS
6.0000 | MEDICATED_PAD | Freq: Every day | CUTANEOUS | Status: DC
  Administered 2023-05-25: 6 via TOPICAL

## 2023-05-24 MED ORDER — LEVOTHYROXINE SODIUM 25 MCG PO TABS: 25.0000 ug | ORAL_TABLET | Freq: Every day | ORAL | 0 refills | Status: DC

## 2023-05-24 MED ORDER — LACTATED RINGERS IV BOLUS
250.0000 mL | Freq: Once | INTRAVENOUS | Status: AC
  Administered 2023-05-24: 250 mL via INTRAVENOUS

## 2023-05-24 MED ORDER — MEDIHONEY WOUND/BURN DRESSING EX PSTE: 1.0000 | PASTE | Freq: Every day | CUTANEOUS | 0 refills | Status: DC

## 2023-05-24 MED ORDER — VANCOMYCIN HCL 10 G IV SOLR
2500.0000 mg | Freq: Once | INTRAVENOUS | Status: DC
  Filled 2023-05-24: qty 25

## 2023-05-24 MED ORDER — ACETAMINOPHEN 325 MG PO TABS
650.0000 mg | ORAL_TABLET | Freq: Once | ORAL | Status: AC
  Administered 2023-05-24: 650 mg via ORAL
  Filled 2023-05-24: qty 2

## 2023-05-24 MED ORDER — LACTATED RINGERS IV BOLUS: 500.0000 mL | Freq: Once | INTRAVENOUS | Status: DC

## 2023-05-24 NOTE — Progress Notes (Signed)
Pharmacy Antibiotic Note  Phillip Young is a 37 y.o. male with ESRD on MWF HD (last session 10/28) admitted on 05/18/2023 with acute on chronic respiratory failure.  Pharmacy has been consulted for vancomycin and cefepime dosing for empiric sepsis coverage.  Plan: Cefepime 1g IV q24h (give after HD on dialysis days) Vancomycin 2g IV x 1 today, then 1g IV after each HD session Check vancomycin levels as needed Follow up renal function, cultures as available, clinical progress, length of tx  Height: 6' (182.9 cm) Weight: (!) 161.3 kg (355 lb 9.6 oz) (bed) IBW/kg (Calculated) : 77.6  Temp (24hrs), Avg:99.7 F (37.6 C), Min:98.6 F (37 C), Max:101.2 F (38.4 C)  Recent Labs  Lab 05/19/23 0451 05/20/23 1422 05/21/23 0710 05/21/23 0711 05/22/23 0501 05/23/23 0446 05/24/23 0445  WBC 5.5 5.6  --  5.8 6.1 5.4  --   CREATININE 13.56* 9.60* 10.65*  --  7.41* 8.88* 6.71*    Estimated Creatinine Clearance: 23.5 mL/min (A) (by C-G formula based on SCr of 6.71 mg/dL (H)).    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, SRA positive   Metoprolol Other (See Comments)    near syncope   Amoxicillin Other (See Comments)    Was hospitalized    Other Swelling and Other (See Comments)    Steroids Fluid seeping out of legs     Antimicrobials this admission: 10/29 Vanc >> 10/29 Cefepime >>  Dose adjustments this admission:  Microbiology results: 10/29 BCx:  Thank you for allowing pharmacy to be a part of this patient's care.  Loralee Pacas, PharmD, BCPS 05/24/2023 7:09 PM  Please check AMION for all [facility] phone numbers After 10:00 PM, call Main Pharmacy [phone]

## 2023-05-24 NOTE — Discharge Planning (Addendum)
[facility] Patient Discharge Orders- [facility] CLINIC: [facility]  Patient's name: Phillip Young Admit/DC Dates: 05/18/2023 - pending (likely today)  Discharge Diagnoses: Chronic hypotension: on Midodrine 30mg  TID  Acute on chronic CHF: 5L [place] as baseline, on Dogoxin. Per Cardiology, not a candidate for nodal ablation or ICD pacemaker Chronic Afib: On Eliquis, refractory cardioversion X 3 previously, not on Amio 2nd toxicity R foot drop Neuropathic pain: On oxy, gaba, urea lotion, lidocaine lotion  Aranesp: Given: Yes    Date and amount of last dose: on 10/26   Last Hgb: 10.2 PRBC's Given: No  ESA dose for discharge: mircera 150 mcg IV q 2 weeks  IV Iron dose at discharge: N/A  Heparin change: N/A. No heparin 2nd (+) HIT  EDW Change: Yes New EDW: Lower to 165kg  Bath Change: No  Access intervention/Change: No  Hectorol/Calcitriol change: N/A  Discharge Labs: Calcium 10.5 Phosphorus 6.7 Albumin 2.6 K+ 4.2  IV Antibiotics: No  On Coumadin?: No. On Eliquis   OTHER/APPTS/LAB ORDERS:  Please note: After discharge from previous long hospital/rehab stay, pt was unable attend dialysis multiple times as unable to get to his transportation. In formed by the renal navigator today of him not receiving any bed offers at the SNF and he wants to go home. Apparently, patient reported to PT that he has friends and family who will push his WC to and from transportation. Hopeful he will come to all scheduled HD sessions.    D/C Meds to be reconciled by nurse after every discharge.  Completed By: Salome Holmes, NP   Reviewed by: MD:______ RN_______

## 2023-05-24 NOTE — Progress Notes (Signed)
Update from Dr. Lorenza Chick note from earlier this evening. Dr. Carron Brazen and I evaluated the patient at the bedside and he was resting comfortably while talking to his mom on the phone. His rigors have resolved, but he still feels like something is not right, especially given that he has a 103F fever now. He does not feel dizzy or lightheaded at rest, but did feel slightly dizzy when he sat up and turned his head.   STAT labs were ordered at 7pm and show a lactic acid level that was elevated to 3.1, and then to 4.0. A 500 cc bolus of LR was ordered after the first lactic acid returned, but bolus was not complete by the time the second lab was drawn. Blood cultures were also collected and are in process. CBC had an elevated white count of 10.6 (up from 5.4) that is neutrophil predominant, raising further suspicion for infection/sepsis. RFP largely unchanged/electrolytes are normal. CXR has not been formally read but appears unchanged compared to CXR from admission. EKG now shows Afib with RVR. Last BP 87/68.   I ordered a one time dose of Tylenol 650 mg in attempt to break this patient's fever (last dose was given at 1837). Also ordered another 250 cc bolus of LR. I suspect that this patient's Afib with RVR is being driven by sepsis/infection, so we will continue to treat with antibiotics and give fluids. We are limited in our treatment's for this patient's RVR, as he cannot be on diltiazem, beta blockers, or amiodarone. He is on PO digoxin and I discussed this with the on-call cardiology fellow, who advised we may need IV digoxin for him, but he will come by to see the patient tonight.     Elza Rafter, DO  Internal Medicine Resident, PGY-3 On-call pager: [phone]

## 2023-05-24 NOTE — Progress Notes (Signed)
HD#1 SUBJECTIVE:  Patient Summary: Phillip Young is a 37 y.o. with a pertinent PMH of ESRD on MWF HD, HFrEF, chronic hypoxic respiratory failure on 5L [place], Afib on Eliquis who presented with missed dialysis and dyspnea and admitted for acute on chronic hypoxic respiratory failure.   Overnight Events: None  Interim History: [name] nausea resolved. Pain under control. No dyspnea. Not certain that he wants to go to SNF. He is trying to eat less, lose weight, feels confident about walking now that his feet swelling has gone down.  OBJECTIVE:  Vital Signs: Vitals:   05/23/23 1729 05/23/23 2102 05/24/23 0427 05/24/23 0858  BP: 103/69 (!) 100/58 117/69 127/80  Pulse: 72 90 79 83  Resp: 20 18 18 18   Temp: 98.6 F (37 C) 98.8 F (37.1 C) 98.6 F (37 C) 98.8 F (37.1 C)  TempSrc:      SpO2: 99% 100% 100% 99%  Weight:      Height:       Supplemental O2: Nasal Cannula SpO2: 99 % O2 Flow Rate (L/min): 4 L/min  Filed Weights   05/21/23 1156 05/23/23 0850 05/23/23 1356  Weight: (!) 167.5 kg (!) 166.2 kg (!) 161.3 kg     Intake/Output Summary (Last 24 hours) at 05/24/2023 1010 Last data filed at 05/24/2023 0800 Gross per 24 hour  Intake 220 ml  Output 5000 ml  Net -4780 ml   Net IO Since Admission: -13,211.2 mL [05/24/23 1010]  Physical Exam: Physical Exam Constitutional:      General: He is not in acute distress.    Appearance: Normal appearance. He is obese. He is not ill-appearing.  HENT:     Head: Normocephalic and atraumatic.  Cardiovascular:     Rate and Rhythm: Normal rate and regular rhythm.     Pulses: Normal pulses.  Pulmonary:     Effort: Pulmonary effort is normal.     Breath sounds: Normal breath sounds.  Abdominal:     General: Abdomen is flat.     Palpations: Abdomen is soft.     Tenderness: There is no abdominal tenderness.  Musculoskeletal:        General: No tenderness.     Right lower leg: No edema.     Left lower leg: No edema.  Skin:     General: Skin is warm and dry.     Capillary Refill: Capillary refill takes less than 2 seconds.  Neurological:     General: No focal deficit present.     Mental Status: He is alert and oriented to person, place, and time.  Psychiatric:        Mood and Affect: Mood normal.        Behavior: Behavior normal.     Patient Lines/Drains/Airways Status     Active Line/Drains/Airways     Name Placement date Placement time Site Days   Peripheral IV 05/21/23 22 G 1" Anterior;Left Forearm 05/21/23  2220  Forearm  3   Hemodialysis Catheter Right Internal jugular Double lumen Permanent (Tunneled) 03/09/23  0933  Internal jugular  76   Pressure Injury 02/03/23 Coccyx Mid Stage 2 -  Partial thickness loss of dermis presenting as a shallow open injury with a red, pink wound bed without slough. 02/03/23  0800  -- 110   Pressure Injury 05/18/23 Buttocks Right Stage 3 -  Full thickness tissue loss. Subcutaneous fat may be visible but bone, tendon or muscle are NOT exposed. 05/18/23  2300  -- 6   Pressure  Injury 05/18/23 Buttocks Left Stage 2 -  Partial thickness loss of dermis presenting as a shallow open injury with a red, pink wound bed without slough. 05/18/23  2300  -- 6             ASSESSMENT/PLAN:  Assessment: Principal Problem:   Acute on chronic hypoxic respiratory failure ([name]) Active Problems:   ESRD (end stage renal disease) on dialysis ([name])   Congestive heart failure ([name])  Phillip Young is a 37 y.o. man with a pertinent PMH of ESRD on MWF HD, HFrEF, chronic hypoxic respiratory failure on 5L [place], Afib on Eliquis who presented with shortness of breath, and admitted for acute on chronic RF and volume overload due to missed dialysis.   This patient is medically stable for discharge, pending placement at a SNF.   Volume overload ESRD on HD MWF Chronic Hypotension Pt stable, weight is down, and attending dialysis without problems.  - Continue HD on MWF.  - Midodrine 30 mg 3 times  daily for hypotension at HD - Gabapentin 300 mg 3 times daily after HD for neuropathy - Fosrenol 2000 mg TID with meals - PT/ OT continue to follow, rehab not an option. SNF instead. - Social work is looking for SNF, no bed yet, thank you   Barriers to dialysis as outpatient: After discharge from previous long hospital/rehab stay, pt was unable attend dialysis multiple times as unable to get to his transportation - he cannot ambulate or safely get himself into a car. He cannot walk up stairs and his apartment is at a ground/basement level requiring him to travel around the back and up a hill which is a significant challenge for him and his helpers.   Acute on chronic hypoxic respiratory failure (resolved) HFrEF Patient wears 5 L [place] at baseline, usually between 4-5L while here. Some transient hypoxia at admission long resolved. He does have LE edema that is slowly improving and his weight is going down - Digoxin 0.25 3 times weekly.  - Pt not on GDMT but hypotension requiring midodrine is a limiting factor. Will continue to evaluate if there is a role here. - 5L  is baseline, remains there   Last echo done in July with EF 20-25%, severely decreased LV function, LVH, and LV diastolic function unable to be evaluated. He has been evaluated by cardiology, not a candidate for nodal ablation, ICD or pacemaker.  He is yet to follow-up with cardiology outpatient.   Chronic Afib on Anticoagulation Stable, Chronic anticoagulated with Eliquis 5 mg. He is asymptomatic. Refractory to cardioversion x 3 and, and not on amiodarone due to amiodarone toxicity. Beta blockers poor choice due to hypotension. - Continue Eliquis 5mg  every day. Was at reduced dose previously due to anal fissures, but it resolved. Will monitor. Pt agreeable to full standard dosing.    Ambulatory Dysfunction Foot Drop R foot Pt with immense difficulty navigating the out-of-hospital environment due to deconditioning from his long prior  hospitalization, body habitus, foot drop (R side, originated when he was sedated in ICU this summer), neuropathy. He is unable to get to outpatient dialysis in part due to this, as well as general transportation challenges. Was not able to attend any outpatient dialysis sessions since leaving rehab on 10/18. He feels somewhat more comfortable on his feet now that his swelling is reduced. - PT/OT involved, thank you. Current recs for SNF. Recommend OP evaluation of his foot drop.   Neuropathic pain Dry feet - Oxycodone 15 mg. -  gabapentin 300 after dialysis per above - Urea lotion  - Lidocaine lotion    Pt medically stable and ready for discharge to SNF once bed become available.  Best Practice: Diet: Renal diet IVF: none VTE: Eliquis Code: Full AB: None Therapy Recs: SNF DISPO: Anticipated discharge in 1-5 days to SNF pending placement.  Signature: [name], D.O.  Internal Medicine Resident, PGY-1 [facility]  Pager: [phone] 10:10 AM, 05/24/2023   Please contact the on call pager after 5 pm and on weekends at [phone].

## 2023-05-24 NOTE — Progress Notes (Signed)
Called [facility]  to cancel discharge talked to [facility].

## 2023-05-24 NOTE — TOC Progression Note (Signed)
Transition of Care [facility]) - Progression Note    Patient Details  Name: Phillip Young MRN: [id number] Date of Birth: Oct 14, 1985  Transition of Care [facility]) CM/SW Contact  Lorri Frederick, LCSW Phone Number: 05/24/2023, 1:36 PM  Clinical Narrative:   CSW met with pt regarding DC plan.  Discussed that pt has no SNF bed offers.  Pt states that he does not want to pursue SNF, wants to DC home where he stays with his mother.    1330: CSW spoke with pt mother [name] to discuss this and she is also in agreement with this plan, states pt has DME at home and she would like [facility] PT to be put in place.   Team has been updated.      Expected Discharge Plan: Skilled Nursing Facility Barriers to Discharge: SNF Pending bed offer  Expected Discharge Plan and Services       Living arrangements for the past 2 months: Apartment                                       Social Determinants of Health (SDOH) Interventions SDOH Screenings   Food Insecurity: No Food Insecurity (05/18/2023)  Housing: Low Risk  (05/18/2023)  Transportation Needs: No Transportation Needs (05/18/2023)  Utilities: Not At Risk (05/18/2023)  Alcohol Screen: Low Risk  (09/21/2022)  Depression (PHQ2-9): High Risk (09/21/2022)  Financial Resource Strain: Low Risk  (03/24/2023)   Received from [facility]  Physical Activity: Inactive (09/21/2022)  Social Connections: Moderately Isolated (03/24/2023)   Received from [facility]  Stress: No Stress Concern Present (04/21/2023)   Received from [facility]  Tobacco Use: Medium Risk (05/18/2023)    Readmission Risk Interventions    03/23/2023    3:48 PM 06/09/2022   12:19 PM 05/12/2021   10:35 AM  Readmission Risk Prevention Plan  Transportation Screening Complete Complete Complete  Medication Review Oceanographer) Complete Complete Complete  PCP or Specialist appointment within 3-5 days of discharge -- Complete Complete  HRI or Home Care Consult Complete  Complete Complete  SW Recovery Care/Counseling Consult Complete Complete Complete  Palliative Care Screening Not Applicable Not Applicable Complete  Skilled Nursing Facility Not Applicable Not Applicable Complete

## 2023-05-24 NOTE — Progress Notes (Signed)
   05/24/23 1822  Assess: MEWS Score  Temp (!) 101.2 F (38.4 C)  BP 121/72  MAP (mmHg) 88  Pulse Rate (!) 139  Resp (!) 25  SpO2 94 %  O2 Device Nasal Cannula  O2 Flow Rate (L/min) 4 L/min  Assess: MEWS Score  MEWS Temp 1  MEWS Systolic 0  MEWS Pulse 3  MEWS RR 1  MEWS LOC 0  MEWS Score 5  MEWS Score Color Red  Assess: if the MEWS score is Yellow or Red  Were vital signs accurate and taken at a resting state? Yes  Does the patient meet 2 or more of the SIRS criteria? Yes  Does the patient have a confirmed or suspected source of infection? No  MEWS guidelines implemented  Yes, red  Treat  MEWS Interventions Considered administering scheduled or prn medications/treatments as ordered  Take Vital Signs  Increase Vital Sign Frequency  Red: Q1hr x2, continue Q4hrs until patient remains green for 12hrs  Escalate  MEWS: Escalate Red: Discuss with charge nurse and notify provider. Consider notifying RRT. If remains red for 2 hours consider need for higher level of care  Notify: Charge Nurse/RN  Name of Charge Nurse/RN Notified Arts administrator  Provider Notification  Provider Name/Title Leitha Bleak MD  Date Provider Notified 05/24/23  Time Provider Notified 1825  Method of Notification Page  Notification Reason Change in status  Provider response En route  Date of Provider Response 05/24/23  Time of Provider Response 1826  Notify: Rapid Response  Date Rapid Response Notified 05/24/23  Time Rapid Response Notified 1835  Assess: SIRS CRITERIA  SIRS Temperature  1  SIRS Pulse 1  SIRS Respirations  1  SIRS WBC 0  SIRS Score Sum  3

## 2023-05-24 NOTE — TOC Transition Note (Signed)
Transition of Care [facility]) - CM/SW Discharge Note   Patient Details  Name: Stepen Tande MRN: [id number] Date of Birth: 12-07-1985  Transition of Care [facility]) CM/SW Contact:  Tom-Johnson, Hershal Coria, RN Phone Number: 05/24/2023, 4:44 PM   Clinical Narrative:     Patient is scheduled for discharge today.  Readmission Risk Assessment done. Home health resumption of care info, Outpatient wound care info, hospital f/u and discharge instructions on AVS. CM called [facility] and scheduled outpatient dialysis for tomorrow, Friday and Monday, patient and mother notified.  [name], Maurine Minister requested [facility] scheduled to transport at discharge.  No further TOC needs noted.          Final next level of care: Home w Home Health Services Barriers to Discharge: Barriers Resolved   Patient Goals and CMS Choice CMS Medicare.gov Compare Post Acute Care list provided to:: Patient Choice offered to / list presented to : Patient, Parent (Mother, Maurine Minister)  Discharge Placement                  Patient to be transferred to facility by: PT[facility]      Discharge Plan and Services Additional resources added to the After Visit Summary for                  DME Arranged: N/A DME Agency: NA       HH Arranged: PT, RN, Social Work [employer] Agency: [facility] Date HH Agency Contacted: 05/24/23 Time HH Agency Contacted: 1508 Representative spoke with at [facility] Agency: Elnita Maxwell  Social Determinants of Health (SDOH) Interventions SDOH Screenings   Food Insecurity: No Food Insecurity (05/18/2023)  Housing: Low Risk  (05/18/2023)  Transportation Needs: No Transportation Needs (05/18/2023)  Utilities: Not At Risk (05/18/2023)  Alcohol Screen: Low Risk  (09/21/2022)  Depression (PHQ2-9): High Risk (09/21/2022)  Financial Resource Strain: Low Risk  (03/24/2023)   Received from [facility]  Physical Activity: Inactive (09/21/2022)  Social Connections: Moderately Isolated  (03/24/2023)   Received from [facility]  Stress: No Stress Concern Present (04/21/2023)   Received from [facility]  Tobacco Use: Medium Risk (05/18/2023)     Readmission Risk Interventions    05/24/2023    4:43 PM 03/23/2023    3:48 PM 06/09/2022   12:19 PM  Readmission Risk Prevention Plan  Transportation Screening Complete Complete Complete  PCP or Specialist Appt within 5-7 Days Complete    Home Care Screening Complete    Medication Review (RN CM) Referral to Pharmacy    Medication Review (RN Care Manager)  Complete Complete  PCP or Specialist appointment within 3-5 days of discharge  -- Complete  HRI or Home Care Consult  Complete Complete  SW Recovery Care/Counseling Consult  Complete Complete  Palliative Care Screening  Not Applicable Not Applicable  Skilled Nursing Facility  Not Applicable Not Applicable

## 2023-05-24 NOTE — Progress Notes (Signed)
[facility] Progress Note   Subjective:    Seen and examined patient at bedside. Tolerated yesterday's HD with net UF 5L. Denies any acute issues. Noted awaiting SNF placement.  Objective Vitals:   05/23/23 1729 05/23/23 2102 05/24/23 0427 05/24/23 0858  BP: 103/69 (!) 100/58 117/69 127/80  Pulse: 72 90 79 83  Resp: 20 18 18 18   Temp: 98.6 F (37 C) 98.8 F (37.1 C) 98.6 F (37 C) 98.8 F (37.1 C)  TempSrc:      SpO2: 99% 100% 100% 99%  Weight:      Height:       Physical Exam General: Overweight man, NAD. Nasal O2 in place. Heart: RRR; no murmur Lungs: Diminished BL lowers/mid, clear BL uppers Abdomen: soft, obese Extremities: Trace BLE edema with tenderness to palpation Dialysis Access: [facility] in R chest  Filed Weights   05/21/23 1156 05/23/23 0850 05/23/23 1356  Weight: (!) 167.5 kg (!) 166.2 kg (!) 161.3 kg    Intake/Output Summary (Last 24 hours) at 05/24/2023 1358 Last data filed at 05/24/2023 0800 Gross per 24 hour  Intake 220 ml  Output 0 ml  Net 220 ml    Additional Objective Labs: Basic Metabolic Panel: Recent Labs  Lab 05/19/23 0451 05/20/23 1422 05/21/23 0710 05/22/23 0501 05/23/23 0446 05/24/23 0445  NA 135 134* 135 134* 135 135  K 5.5* 5.1 5.5* 4.2 4.3 4.2  CL 102 98 97* 96* 94* 97*  CO2 21* 21* 26 25 26 24   GLUCOSE 85 81 73 91 78 87  BUN 46* 35* 38* 23* 25* 14  CREATININE 13.56* 9.60* 10.65* 7.41* 8.88* 6.71*  CALCIUM 10.2 10.1 10.2 9.9 11.1* 10.5*  PHOS 6.0* 5.9* 6.7*  --   --   --    Liver Function Tests: Recent Labs  Lab 05/18/23 1729 05/19/23 0451 05/20/23 1422 05/21/23 0710  AST 41  --   --   --   ALT 26  --   --   --   ALKPHOS 149*  --   --   --   BILITOT 0.7  --   --   --   PROT 6.7  --   --   --   ALBUMIN 2.8* 2.4* 2.5* 2.6*   No results for input(s): "LIPASE", "AMYLASE" in the last 168 hours. CBC: Recent Labs  Lab 05/18/23 1729 05/19/23 0451 05/20/23 1422 05/21/23 0711 05/22/23 0501  05/23/23 0446  WBC 7.2 5.5 5.6 5.8 6.1 5.4  NEUTROABS 4.7  --   --   --   --   --   HGB 9.3* 7.8* 8.3* 8.2* 8.3* 10.2*  HCT 30.3* 25.6* 26.4* 27.1* 26.6* 34.3*  MCV 105.2* 101.6* 100.0 101.5* 101.1* 101.5*  PLT 253 229 251 246 248 290   Blood Culture    Component Value Date/Time   SDES BLOOD RIGHT HAND 03/11/2023 0718   SPECREQUEST  03/11/2023 0718    BOTTLES DRAWN AEROBIC AND ANAEROBIC Blood Culture adequate volume   CULT  03/11/2023 0718    NO GROWTH 5 DAYS Performed at [facility], [address], [place], [place] [address]    REPTSTATUS 03/16/2023 FINAL 03/11/2023 3235    Cardiac Enzymes: No results for input(s): "CKTOTAL", "CKMB", "CKMBINDEX", "TROPONINI" in the last 168 hours. CBG: No results for input(s): "GLUCAP" in the last 168 hours. Iron Studies: No results for input(s): "IRON", "TIBC", "TRANSFERRIN", "FERRITIN" in the last 72 hours. Lab Results  Component Value Date   INR 2.0 (  H) 06/10/2022   INR 2.8 (H) 06/09/2022   INR 2.9 (H) 06/07/2022   Studies/Results: No results found.  Medications:   apixaban  5 mg Oral BID   Chlorhexidine Gluconate Cloth  6 each Topical Q0600   darbepoetin (ARANESP) injection - DIALYSIS  100 mcg Subcutaneous Q Sat-1800   digoxin  0.125 mg Oral Once per day on Monday Wednesday Friday   feeding supplement (NEPRO CARB STEADY)  237 mL Oral BID BM   gabapentin  300 mg Oral Q M,W,F-HD   Gerhardt's butt cream   Topical TID   lanthanum  2,000 mg Oral TID WC   leptospermum manuka honey  1 Application Topical Daily   levothyroxine  25 mcg Oral Q0600   midodrine  30 mg Oral TID WC   urea   Topical Daily    Dialysis Orders: MWF at [facility] 4.5hr, 400/800, EDW 172kg, 2K/2Ca bath, TDC, no heparin (+ HIT)  Assessment/Plan: 1. Acute Hypoxic RF: In setting of missed HD due to transportation issues. Improving. 2. ESRD: Back on MWF - next HD 10/30. Maximizing UF as tolerated. + HIT - no heparin. Renal US showed no hydronephrosis and  increasing echogenicity consistent with hepatic steatosis. 3. HypoTN/volume: BP stable on high dose midodrine (30mg  TID), lots of edema still, will need EDW lowered. 4. Anemia: Hgb 10.2 - continue Aranesp q Sat while here. 5. Secondary hyperparathyroidism: CorrCa high, Phos high - continue lanthanum binders. No VDRA. Check PTH. 6. Nutrition: Alb low, continue supps. 7. HFrEF: On digoxin + Eliquis. 8. Obesity  Phillip Holmes, NP [facility] 05/24/2023,1:58 PM  LOS: 1 day

## 2023-05-24 NOTE — Progress Notes (Signed)
   05/24/23 1830  Vitals  Temp (!) 101.2 F (38.4 C)  BP 121/72  MAP (mmHg) 88  BP Location Left Wrist  BP Method Automatic  Patient Position (if appropriate) Lying  Pulse Rate 73  ECG Heart Rate (!) 141  Resp (!) 33  MEWS COLOR  MEWS Score Color Red  MEWS Score  MEWS Temp 1  MEWS Systolic 0  MEWS Pulse 3  MEWS RR 2  MEWS LOC 0  MEWS Score 6

## 2023-05-24 NOTE — Progress Notes (Addendum)
Physical Therapy Treatment Patient Details Name: Phillip Young MRN: [id number] DOB: 07/15/1986 Today's Date: 05/24/2023   History of Present Illness Pt is a 37 y/o M admitted on 05/18/23 after presenting with c/o missing dialysis x 1 week. Pt with hospital admission in June 2024, transitioned to rehab, and d/c home on 05/13/23 but was unable to get to transport vehicle. HH nurse advised pt to come to ED after missing dialysis appointments. PMH: a-fib, HFrEF, severe obesity, ESRD on HD MWF, chronic hypoxic respiratory failure on 5L O2, cardiac cath, cardioversion.    PT Comments  Pt received sitting EOB, agreeable to therapy session and with good effort and participation in seated exercise and transfer training. Pt remains limited due to bil foot pain, L>R and RLE foot drop, he would benefit from orthotist consult for RLE AFO given multiple months history of foot drop without improvement. Pt performed multiple transfers from elevated EOB height (pt has hospital bed at home) with minA (+2 safety) and took a couple forward/backward steps limited by foot pain. Pt would need power wheelchair at home to ensure he is able to safely get from back apt entrance to area where HD transport Phillip Niece picks him up, or have reliable assist to push his wheelchair 6x weekly (3x a week and 2x each day before/after HD apts) to apartment and parking area due to elevated pathway to parking area. DME updated below per discussion with supervising PT Phillip Young. If consistent support available from family/friends to push his manual wheelchair or power wheelchair available, he is probably able to DC home with home from mom for ADLs, but if not, short term lower intensity post-acute rehab remains his safest discharge option to continue building BLE strength/endurance and for wound healing/mgmt.    If plan is discharge home, recommend the following: Two people to help with walking and/or transfers;Two people to help with  bathing/dressing/bathroom;Help with stairs or ramp for entrance;Assist for transportation;Assistance with cooking/housework   Can travel by private vehicle     No  Equipment Recommendations  Other (comment);Wheelchair (measurements PT);Wheelchair cushion (measurements PT) (power wheelchair to get up hill next to apt where HD Phillip Niece will pick him up)    Recommendations for Other Services       Precautions / Restrictions Precautions Precautions: Fall Precaution Comments: R foot drop- no AFO Restrictions Weight Bearing Restrictions: No     Mobility  Bed Mobility Overal bed mobility: Needs Assistance Bed Mobility: Supine to Sit, Sit to Supine     Supine to sit: Min assist, HOB elevated, Used rails Sit to supine: +2 for physical assistance, Mod assist, HOB elevated, Used rails   General bed mobility comments: pt received/remained sitting EOB    Transfers Overall transfer level: Needs assistance Equipment used: Rolling walker (2 wheels) Transfers: Sit to/from Stand, Bed to chair/wheelchair/BSC Sit to Stand: Min assist, +2 safety/equipment, From elevated surface   Step pivot transfers: Min assist, +2 safety/equipment, From elevated surface       General transfer comment: STS from EOB x3 reps, pt c/o severe BLE pain L>R while standing and HR elevated. A couple forward/backward steps x2 reps and marching in place but pt defer OOB to chair due to pain.    Ambulation/Gait Ambulation/Gait assistance: +2 safety/equipment, Contact guard assist   Assistive device: Rolling walker (2 wheels) (bari width) Gait Pattern/deviations: Step-to pattern, Decreased dorsiflexion - right, Shuffle     Pre-gait activities: ~2-3 ft forward/backward steps x2 reps, pt defer longer distance due to c/o pain and  pt notably tachy while standing (HR ~141 bpm) due to pain.     Stairs             Wheelchair Mobility     Tilt Bed    Modified Rankin (Stroke Patients Only)       Balance  Overall balance assessment: Needs assistance Sitting-balance support: Feet supported Sitting balance-Phillip Young Scale: Good Sitting balance - Comments: static sitting EOB unsupported no LOB   Standing balance support: Bilateral upper extremity supported, During functional activity, Reliant on assistive device for balance Standing balance-Phillip Young Scale: Poor Standing balance comment: heavy reliance on bari RW for dynamic standing tasks                            Cognition Arousal: Alert Behavior During Therapy: WFL for tasks assessed/performed Overall Cognitive Status: Within Functional Limits for tasks assessed                                 General Comments: agreeable with encouragement/education from therapists.        Exercises Other Exercises Other Exercises: seated RLE AROM: LAQ x 5 reps Other Exercises: RLE PROM: Ankle pumps x5 reps; LLE AROM: AP x 5 reps Other Exercises: STS x 3 reps for BLE strengthening; static standing at RW ~3 mins x 2 sets and LLE AROM standing in RW: hip flexion x5 reps    General Comments General comments (skin integrity, edema, etc.): SpO2 WFL on 4L HF [name]; HR elevated to 141 bpm while standing; HR elevated 80's-120's bpm sitting pre/post standing and pt c/o pain with weight bearing. BP stable with sitting to standing transfer, checked on R wrist      Pertinent Vitals/Pain Pain Assessment Pain Assessment: Faces Faces Pain Scale: Hurts whole lot Pain Location: BLE with movement/touch, L>R Pain Descriptors / Indicators: Grimacing, Burning, Discomfort, Numbness Pain Intervention(s): Limited activity within patient's tolerance, Monitored during session, Repositioned, Patient requesting pain meds-RN notified           PT Goals (current goals can now be found in the care plan section) Acute Rehab PT Goals Patient Stated Goal: decreased pain, get better PT Goal Formulation: With patient Time For Goal Achievement:  05/27/23 Progress towards PT goals: Progressing toward goals    Frequency    Min 1X/week      PT Plan      Co-evaluation PT/OT/SLP Co-Evaluation/Treatment: Yes Reason for Co-Treatment: For patient/therapist safety;To address functional/ADL transfers PT goals addressed during session: Mobility/safety with mobility;Proper use of DME;Strengthening/ROM OT goals addressed during session: ADL's and self-care      AM-PAC PT "6 Clicks" Mobility   Outcome Measure  Help needed turning from your back to your side while in a flat bed without using bedrails?: None Help needed moving from lying on your back to sitting on the side of a flat bed without using bedrails?: A Little Help needed moving to and from a bed to a chair (including a wheelchair)?: A Lot Help needed standing up from a chair using your arms (e.g., wheelchair or bedside chair)?: A Lot Help needed to walk in hospital room?: Total Help needed climbing 3-5 steps with a railing? : Total 6 Click Score: 13    End of Session Equipment Utilized During Treatment: Gait belt;Oxygen Activity Tolerance: Patient limited by pain Patient left: in bed;with call bell/phone within reach;Other (comment) (pt sitting EOB, does not want  RW moved away from him, RN notified bed alarm not on, pt agreeable to press call bell before getting up) Nurse Communication: Mobility status;Patient requests pain meds PT Visit Diagnosis: Muscle weakness (generalized) (M62.81);Pain;Other abnormalities of gait and mobility (R26.89);Difficulty in walking, not elsewhere classified (R26.2) Pain - Right/Left:  (both, L>R) Pain - part of body: Ankle and joints of foot     Time: 1310-1343 PT Time Calculation (min) (ACUTE ONLY): 33 min  Charges:    $Therapeutic Exercise: 8-22 mins PT General Charges $$ ACUTE PT VISIT: 1 Visit                     Kayli Beal P., PTA Acute Rehabilitation Services Secure Chat Preferred 9a-5:30pm Office: [phone]    Dorathy Kinsman  [facility] 05/24/2023, 2:47 PM

## 2023-05-24 NOTE — Progress Notes (Signed)
Paged by daytime RN at 6:30PM about patient's temp to 101.375F and rigors. Patient was waiting for PTAR to be be discharged home. Repeat temperature 101.375F at 18:35PM. Tylenol administered and discharged canceled.   Patient was assessed at beside. He feels poorly, with rigors, myalgias, and lack of appetite. Denies chest pain, shob,cough, sputum production, nausea, vomiting. No abdominal pain or lower urinary symptoms. Mild tenderness around HD site on the right. He reported that he has pressure wounds on his buttocks. Last HD session on 10/28 through HD catheter. Poor po intake today, he has been thirsty. No change in his mentation, syncopal episodes.  Vitals:   05/24/23 1830 05/24/23 1835  BP: 121/72   Pulse: 73   Resp: (!) 33 (!) 23  Temp: (!) 101.2 F (38.4 C)   SpO2:     Tele reviewed, sinus tachycardia Patient in mild distress, covered in blankets Tachycardia, difficult to assess JVP in this person Speaking in full sentences, mild tachypnea, poor respiratory effort, wearing nasal cannula. No wheezes Soft abdomen, non tender,  no suprapubic tenderness HD catheter on r anterior test, mid tenderness, no drainage or fluctuance. Difficult to visualize if erythema Skin dry and warm Alert and oriented with insight into his current medical status. Was excited to go home  Phillip Young is a 37 yo person with a hx of ESRD MWF iHD R internal jugular cath, HFrEF <20 % EF on midodrine, CHRF on 5L supplemental O2, pAfib on Eliquis initially admitted for [facility] hyopoxic respiratory failure after missing dyalisis who was initially discharged by IMTS team today after being medically stable for discharge, now being evaluated for new onset fever. Suspect R internal jugular catheter, inserted last August, and pressure injuries to bottocks as potential causes. Patient is now tachycardic to 130, and normotensive likely in setting of potential infection. He has been compliant with anticoagulation; low likelihood of  PE in this hospitalized patient.  Reviewed recent laboratory and imaging during this admission.  Plan: -CBC, blood cultures x 2, RFP, and lactic acid -Cefepime and Vanc  -XR assess pulmonary reserve, infiltrates, pulm edema - Continue cardiac telemetry -Gentle fluid with 500 mL LR over 2 hours given patient's HFrEF -Tylenol for fever PRN -Sign out to night team for ongoing management  Morene Crocker, MD [facility] Internal Medicine Program - PGY-2 05/24/2023, 7:26 PM Pager# [phone]  Please contact the on call pager after 5 pm and on weekends at [phone].

## 2023-05-24 NOTE — Progress Notes (Signed)
Advised that pt has opted to return home at d/c. Pt to d/c to home today. Contacted [facility] to advise staff that pt will d/c today and should resume care tomorrow. Clinic advised that pt informed staff that he has friends/family that can assist with getting him to/from the transportation [place] for HD appts. Nephrologist and renal NP made aware of the above info as well.   Olivia Canter Renal Navigator [phone]

## 2023-05-24 NOTE — Discharge Instructions (Signed)
To [name] or their caretakers,  They were admitted to [facility] on 05/18/2023 for evaluation and treatment of:  Principal Problem:   Acute on chronic hypoxic respiratory failure ([name]) Active Problems:   ESRD (end stage renal disease) on dialysis ([name])   Congestive heart failure ([name])  Resolved Problems:   * No resolved hospital problems. *  The evaluation suggested respiratory failure due to missed kidney dialysis and fluid overload. They were treated kidney dialysis.  They were discharged from the hospital on 05/24/23. I recommend the following after leaving the hospital:   Please make every effort to go to your routine outpatient kidney dialysis appointments.  Home health services are recommended.  Referrals for home health therapy and wound care have been sent.  Expect calls from those agencies in the near future.  Please take all of the medicines prescribed, as indicated in this discharge paperwork.  If you do not have something listed on this list, please call and we will fill it at your local pharmacy.  For questions about your care plan, until you are able to see your primary doctor: Call [phone]. Dial 0 for the operator. Ask for the internal medicine resident on call.  Marrianne Mood MD 05/24/2023, 3:58 PM

## 2023-05-24 NOTE — Progress Notes (Signed)
Occupational Therapy Treatment Patient Details Name: Phillip Young MRN: [id number] DOB: 1986-06-29 Today's Date: 05/24/2023   History of present illness Pt is a 37 y/o M admitted on 05/18/23 after presenting with c/o missing dialysis x 1 week. Pt with hospital admission in June 2024, transitioned to rehab, and d/c home on 05/13/23 but was unable to get to transport vehicle. HH nurse advised pt to come to ED after missing dialysis appointments. PMH: a-fib, HFrEF, severe obesity, ESRD on HD MWF, chronic hypoxic respiratory failure on 5L O2, cardiac cath, cardioversion   OT comments  Pt is making fair progress towards their acute OT goals. Pt seen with PT to progress OOB. Overall pt tolerated 3x standing attempts and able to stand for <1 minute each time due to bilat foot pain. On the last attempt he was able to take some forward and backward steps with heavy use of the RW. Long discussion had with pt about routine and safety for discharge home (he is declining SNF).  Barrier to d/c home is a large incline hill pt would need to navigate up/down 3x a week for HD appointments - a power wheel chair or electric scooter would solve the barrier; care team made aware. OT to continue to follow acutely to facilitate progress towards established goals. Pt will continue to benefit from skilled inpatient follow up therapy, <3 hours/day - unless he can secure a safe/reliable way to get from his apartment to parking lot for HD appointments       If plan is discharge home, recommend the following:  Two people to help with walking and/or transfers;A lot of help with bathing/dressing/bathroom;Assistance with cooking/housework   Equipment Recommendations  None recommended by OT - if going home: PWC or Art gallery manager.       Precautions / Restrictions Precautions Precautions: Fall Precaution Comments: R foot drop- no AFO Restrictions Weight Bearing Restrictions: No       Mobility Bed Mobility Overal bed  mobility: Needs Assistance             General bed mobility comments: pt sitting EOB upon arrival    Transfers Overall transfer level: Needs assistance Equipment used: Rolling walker (2 wheels) Transfers: Sit to/from Stand, Bed to chair/wheelchair/BSC Sit to Stand: Min assist, +2 physical assistance, +2 safety/equipment     Step pivot transfers: Min assist, +2 physical assistance, +2 safety/equipment     General transfer comment: simulated transfer with forward/backward stepping. min A +2 for all standing tasks for safety. Pt limited to <1 minute standing due to bilat foot pain     Balance Overall balance assessment: Needs assistance Sitting-balance support: Feet supported Sitting balance-Leahy Scale: Fair     Standing balance support: Bilateral upper extremity supported, During functional activity Standing balance-Leahy Scale: Poor Standing balance comment: heavy use of BUE on RW         ADL either performed or assessed with clinical judgement   ADL Overall ADL's : Needs assistance/impaired       Toilet Transfer: Minimal assistance;+2 for physical assistance;+2 for safety/equipment;Stand-pivot;Rolling walker (2 wheels) Toilet Transfer Details (indicate cue type and reason): simulated with forward and backward stepping (pt used a bed pan at baseline)         Functional mobility during ADLs: Minimal assistance;+2 for physical assistance;+2 for safety/equipment General ADL Comments: session focused on OOB tolerance and simulated transfers. he is limited to <1 minute of standing at a time prior to sitting rest needed for bilat foot pain    Extremity/Trunk  Assessment Upper Extremity Assessment Upper Extremity Assessment: RUE deficits/detail RUE Deficits / Details: limited shoulder flexion, pain with PROM beyond 90*  and PROM is WNL but he can acheive ~80* AROM but pain persists (reports this occurred after hospitalization in June 2024) RUE Sensation: WNL RUE  Coordination: decreased gross motor   Lower Extremity Assessment Lower Extremity Assessment: Defer to PT evaluation        Vision   Vision Assessment?: Vision impaired- to be further tested in functional context   Perception Perception Perception: Within Functional Limits   Praxis Praxis Praxis: WFL    Cognition Arousal: Alert Behavior During Therapy: WFL for tasks assessed/performed Overall Cognitive Status: Within Functional Limits for tasks assessed             General Comments: limited insight to safety - eager to d/c home. Understands the only safe way to transition home is if he has a PWC/scooter to get in/out of apt on HD days.              General Comments VSS on 4L O2    Pertinent Vitals/ Pain       Pain Assessment Pain Assessment: Faces Faces Pain Scale: Hurts even more Pain Location: bilat feet with weight bearing Pain Descriptors / Indicators: Grimacing, Burning, Discomfort Pain Intervention(s): Limited activity within patient's tolerance, Monitored during session         Frequency  Min 1X/week        Progress Toward Goals  OT Goals(current goals can now be found in the care plan section)  Progress towards OT goals: Progressing toward goals  Acute Rehab OT Goals Patient Stated Goal: to go home OT Goal Formulation: With patient Time For Goal Achievement: 06/02/23 Potential to Achieve Goals: Good ADL Goals Pt Will Perform Lower Body Bathing: sitting/lateral leans;with set-up;with supervision;with adaptive equipment Pt Will Transfer to Toilet: with +2 assist;with max assist;stand pivot transfer;bedside commode Pt/caregiver will Perform Home Exercise Program: Increased strength;Independently;With theraband;Both right and left upper extremity Additional ADL Goal #1: Pt will independently advocate the need for feet moisturization prn to staff, to prevent skin breakdown, at least 3x throughout the week.  Plan      Co-evaluation     PT/OT/SLP Co-Evaluation/Treatment: Yes Reason for Co-Treatment: For patient/therapist safety   OT goals addressed during session: ADL's and self-care      AM-PAC OT "6 Clicks" Daily Activity     Outcome Measure   Help from another person eating meals?: None Help from another person taking care of personal grooming?: A Little Help from another person toileting, which includes using toliet, bedpan, or urinal?: A Lot Help from another person bathing (including washing, rinsing, drying)?: A Lot Help from another person to put on and taking off regular upper body clothing?: A Little Help from another person to put on and taking off regular lower body clothing?: A Lot 6 Click Score: 16    End of Session Equipment Utilized During Treatment: Gait belt;Rolling walker (2 wheels);Oxygen  OT Visit Diagnosis: Unsteadiness on feet (R26.81);Other abnormalities of gait and mobility (R26.89);Pain Pain - part of body: Ankle and joints of foot   Activity Tolerance Patient tolerated treatment well   Patient Left in bed;with call bell/phone within reach   Nurse Communication Mobility status (needs PWC to faciliate home)        Time: 9528-4132 OT Time Calculation (min): 32 min  Charges: OT General Charges $OT Visit: 1 Visit OT Treatments $Therapeutic Activity: 8-22 mins  Derenda Mis, OTR/L Acute  Rehabilitation Services Office [phone] Secure Chat Communication Preferred   Donia Pounds 05/24/2023, 2:03 PM

## 2023-05-24 NOTE — Consult Note (Signed)
Cardiology Consultation   Patient ID: Phillip Young MRN: [id number]; DOB: April 10, 1986  Admit date: 05/18/2023 Date of Consult: 05/24/2023  PCP:  Marrianne Mood, MD   [facility] Providers Cardiologist:  Marca Ancona, MD  Electrophysiologist:  Will Jorja Loa, MD  { Click here to update MD or APP on Care Team, Refresh:1}     Patient Profile:   Phillip Young is a 37 y.o. male with a hx of *** who is being seen 05/24/2023 for the evaluation of *** at the request of ***.  History of Present Illness:   Phillip Young ***   Past Medical History:  Diagnosis Date   Acute on chronic respiratory failure with hypoxia ([name]) 04/21/2021   Acute on chronic systolic (congestive) heart failure ([name]) 02/26/2020   Amiodarone toxicity    Anemia    Atrial flutter ([name])    Biventricular congestive heart failure ([name])    Chronic hypoxemic respiratory failure ([name])    Class 3 severe obesity due to excess calories with serious comorbidity and body mass index (BMI) of 50.0 to 59.9 in adult ([name]) 02/26/2020   Essential hypertension 02/26/2020   GERD without esophagitis 02/26/2020   Hidradenitis suppurativa 02/26/2020   NICM (nonischemic cardiomyopathy) ([name])    Obesity hypoventilation syndrome ([name])    OSA (obstructive sleep apnea)    PAF (paroxysmal atrial fibrillation) ([name])    Pneumonia    Prediabetes 02/26/2020    Past Surgical History:  Procedure Laterality Date   ABSCESS DRAINAGE     AV FISTULA PLACEMENT Left 08/21/2021   Procedure: LEFT ARM ARTERIOVENOUS (AV) FISTULA.;  Surgeon: Nada Libman, MD;  Location: [facility] OR;  Service: Vascular;  Laterality: Left;   CARDIAC CATHETERIZATION     CARDIOVERSION N/A 10/09/2021   Procedure: CARDIOVERSION;  Surgeon: Laurey Morale, MD;  Location: [facility] ENDOSCOPY;  Service: Cardiovascular;  Laterality: N/A;   CARDIOVERSION N/A 05/28/2022   Procedure: CARDIOVERSION;  Surgeon: Laurey Morale, MD;  Location: [facility] ENDOSCOPY;  Service:  Cardiovascular;  Laterality: N/A;   CARDIOVERSION N/A 06/07/2022   Procedure: CARDIOVERSION;  Surgeon: Laurey Morale, MD;  Location: [facility] ENDOSCOPY;  Service: Cardiovascular;  Laterality: N/A;   IR FLUORO GUIDE CV LINE RIGHT  03/10/2021   IR FLUORO GUIDE CV LINE RIGHT  04/22/2021   IR FLUORO GUIDE CV LINE RIGHT  08/20/2021   IR FLUORO GUIDE CV LINE RIGHT  03/01/2023   IR FLUORO GUIDE CV LINE RIGHT  03/09/2023   IR PTA VENOUS EXCEPT DIALYSIS CIRCUIT  03/09/2023   IR REMOVE CV FIBRIN SHEATH  03/09/2023   IR US GUIDE VASC ACCESS RIGHT  03/10/2021   IR US GUIDE VASC ACCESS RIGHT  04/22/2021   RIGHT HEART CATH N/A 03/06/2021   Procedure: RIGHT HEART CATH;  Surgeon: Dolores Patty, MD;  Location: [facility] INVASIVE CV LAB;  Service: Cardiovascular;  Laterality: N/A;   RIGHT/LEFT HEART CATH AND CORONARY ANGIOGRAPHY N/A 03/04/2020   Procedure: RIGHT/LEFT HEART CATH AND CORONARY ANGIOGRAPHY;  Surgeon: Laurey Morale, MD;  Location: [facility] INVASIVE CV LAB;  Service: Cardiovascular;  Laterality: N/A;   TEE WITHOUT CARDIOVERSION N/A 05/05/2021   Procedure: TRANSESOPHAGEAL ECHOCARDIOGRAM (TEE);  Surgeon: Laurey Morale, MD;  Location: [facility] ENDOSCOPY;  Service: Cardiovascular;  Laterality: N/A;   TEMPORARY DIALYSIS CATHETER  03/06/2021   Procedure: TEMPORARY DIALYSIS CATHETER;  Surgeon: Dolores Patty, MD;  Location: [facility] INVASIVE CV LAB;  Service: Cardiovascular;;     {Home Medications (Optional):21181}  Inpatient Medications: Scheduled Meds:  apixaban  5 mg Oral BID   [START ON 05/25/2023] Chlorhexidine Gluconate Cloth  6 each Topical Q0600   darbepoetin (ARANESP) injection - DIALYSIS  100 mcg Subcutaneous Q Sat-1800   digoxin  0.125 mg Oral Once per day on Monday Wednesday Friday   feeding supplement (NEPRO CARB STEADY)  237 mL Oral BID BM   gabapentin  300 mg Oral Q M,W,F-HD   Gerhardt's butt cream   Topical TID   lanthanum  2,000 mg Oral TID WC   leptospermum manuka honey  1 Application Topical  Daily   levothyroxine  25 mcg Oral Q0600   midodrine  30 mg Oral TID WC   urea   Topical Daily   Continuous Infusions:  ceFEPime (MAXIPIME) IV 1 g (05/24/23 2154)   [START ON 05/25/2023] vancomycin     PRN Meds: acetaminophen **OR** acetaminophen, docusate sodium, ondansetron (ZOFRAN) IV, oxyCODONE, polyethylene glycol  Allergies:    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, SRA positive   Metoprolol Other (See Comments)    near syncope   Amoxicillin Other (See Comments)    Was hospitalized    Other Swelling and Other (See Comments)    Steroids Fluid seeping out of legs     Social History:   Social History   Socioeconomic History   Marital status: Single    Spouse name: Not on file   Number of children: Not on file   Years of education: Not on file   Highest education level: Not on file  Occupational History   Not on file  Tobacco Use   Smoking status: Former    Current packs/day: 0.00    Types: Cigarettes    Quit date: 2019    Years since quitting: 5.8    Passive exposure: Current   Smokeless tobacco: Former   Tobacco comments:    quit in 2019  Vaping Use   Vaping status: Never Used  Substance and Sexual Activity   Alcohol use: Never   Drug use: Never   Sexual activity: Not on file  Other Topics Concern   Not on file  Social History Narrative   Not on file   Social Determinants of Health   Financial Resource Strain: Low Risk  (03/24/2023)   Received from [facility]   Overall Financial Resource Strain (CARDIA)    Difficulty of Paying Living Expenses: Not very hard  Food Insecurity: No Food Insecurity (05/18/2023)   Hunger Vital Sign    Worried About Running Out of Food in the Last Year: Never true    Ran Out of Food in the Last Year: Never  true  Transportation Needs: No Transportation Needs (05/18/2023)   PRAPARE - Administrator, Civil Service (Medical): No    Lack of Transportation (Non-Medical): No  Physical Activity: Inactive (09/21/2022)   Exercise Vital Sign    Days of Exercise per Week: 0 days    Minutes of Exercise per Session: 0 min  Stress: No Stress Concern Present (04/21/2023)   Received from [facility]   [employer] of Occupational Health - Occupational Stress Questionnaire    Feeling of Stress : Only a little  Social Connections: Moderately Isolated (03/24/2023)   Received from [facility]   Social Connection and Isolation Panel [NHANES]  Frequency of Communication with Friends and Family: More than three times a week    Frequency of Social Gatherings with Friends and Family: More than three times a week    Attends Religious Services: 1 to 4 times per year    Active Member of [employer] or Organizations: No    Attends Banker Meetings: Never    Marital Status: Never married  Intimate Partner Violence: Not At Risk (05/18/2023)   Humiliation, Afraid, Rape, and Kick questionnaire    Fear of Current or Ex-Partner: No    Emotionally Abused: No    Physically Abused: No    Sexually Abused: No    Family History:   *** Family History  Problem Relation Age of Onset   Heart disease Mother    Hypertension Mother    Pulmonary Hypertension Mother    Drug abuse Father        died due to Heroin overdose     ROS:  Please see the history of present illness.  *** All other ROS reviewed and negative.     Physical Exam/Data:   Vitals:   05/24/23 1835 05/24/23 1937 05/24/23 2030 05/24/23 2224  BP:  (!) 100/44 (!) 87/68 (!) 91/57  Pulse:  (!) 128 (!) 130 (!) 108  Resp: (!) 23 (!) 29 (!) 36 (!) 28  Temp:  98.3 F (36.8 C) (!) 103 F (39.4 C) 99.7 F (37.6 C)  TempSrc:  Oral Oral   SpO2:  91% 94% 91%  Weight:      Height:        Intake/Output Summary (Last 24 hours) at  05/24/2023 2235 Last data filed at 05/24/2023 1300 Gross per 24 hour  Intake 460 ml  Output 0 ml  Net 460 ml      05/23/2023    1:56 PM 05/23/2023    8:50 AM 05/21/2023   11:56 AM  Last 3 Weights  Weight (lbs) 355 lb 9.6 oz 366 lb 6.5 oz 369 lb 4.3 oz  Weight (kg) 161.3 kg 166.2 kg 167.5 kg     Body mass index is 48.23 kg/m.  General:  Well nourished, well developed, in no acute distress*** HEENT: normal Neck: no JVD Vascular: No carotid bruits; Distal pulses 2+ bilaterally Cardiac:  normal S1, S2; RRR; no murmur *** Lungs:  clear to auscultation bilaterally, no wheezing, rhonchi or rales  Abd: soft, nontender, no hepatomegaly  Ext: no edema Musculoskeletal:  No deformities, BUE and BLE strength normal and equal Skin: warm and dry  Neuro:  CNs 2-12 intact, no focal abnormalities noted Psych:  Normal affect   EKG:  The EKG was personally reviewed and demonstrates:  *** Telemetry:  Telemetry was personally reviewed and demonstrates:  ***  Relevant CV Studies: ***  Laboratory Data:  High Sensitivity Troponin:  No results for input(s): "TROPONINIHS" in the last 720 hours.   Chemistry Recent Labs  Lab 05/23/23 0446 05/24/23 0445 05/24/23 1914  NA 135 135 135  K 4.3 4.2 4.5  CL 94* 97* 98  CO2 26 24 24   GLUCOSE 78 87 119*  BUN 25* 14 17  CREATININE 8.88* 6.71* 7.74*  CALCIUM 11.1* 10.5* 10.4*  GFRNONAA 7* 10* 8*  ANIONGAP 15 14 13     Recent Labs  Lab 05/18/23 1729 05/19/23 0451 05/20/23 1422 05/21/23 0710 05/24/23 1914  PROT 6.7  --   --   --   --   ALBUMIN 2.8*   < > 2.5* 2.6* 3.0*  AST 41  --   --   --   --  ALT 26  --   --   --   --   ALKPHOS 149*  --   --   --   --   BILITOT 0.7  --   --   --   --    < > = values in this interval not displayed.   Lipids No results for input(s): "CHOL", "TRIG", "HDL", "LABVLDL", "LDLCALC", "CHOLHDL" in the last 168 hours.  Hematology Recent Labs  Lab 05-27-2023 0501 05/23/23 0446 05/24/23 1914  WBC 6.1 5.4  10.6*  RBC 2.63* 3.38* 3.11*  HGB 8.3* 10.2* 9.6*  HCT 26.6* 34.3* 31.3*  MCV 101.1* 101.5* 100.6*  MCH 31.6 30.2 30.9  MCHC 31.2 29.7* 30.7  RDW 15.0 15.3 15.6*  PLT 248 290 240   Thyroid No results for input(s): "TSH", "FREET4" in the last 168 hours.  BNPNo results for input(s): "BNP", "PROBNP" in the last 168 hours.  DDimer No results for input(s): "DDIMER" in the last 168 hours.   Radiology/Studies:  US RENAL  Result Date: 05/27/23 CLINICAL DATA:  230123 Microscopic hematuria 230123 EXAM: RENAL / URINARY TRACT ULTRASOUND COMPLETE COMPARISON:  January 23, 2023. FINDINGS: Right Kidney: Renal measurements: 10.3 x 4.5 x 5.8 cm = volume: 140 mL. Echogenicity within normal limits. No mass or hydronephrosis visualized. Left Kidney: Renal measurements: 11.5 x 5.9 x 3.9 cm = volume: 139 mL. Echogenicity within normal limits. No mass or hydronephrosis visualized. Bladder: Bladder is completely decompressed. Other: Increased hepatic echogenicity. IMPRESSION: 1. No hydronephrosis. 2. Increased hepatic echogenicity as can be seen in hepatic steatosis. Electronically Signed   By: Meda Klinefelter M.D.   On: May 27, 2023 12:08     Assessment and Plan:   ***   Risk Assessment/Risk Scores:  {Complete the following score calculators/questions to meet required metrics.  Press F2         :[id number]}   {Is the patient being seen for unstable angina, ACS, NSTEMI or STEMI?:[phone]} {Does this patient have CHF or CHF symptoms?      :[id number]} {Does this patient have ATRIAL FIBRILLATION?[phone]}  {Are we signing off today?:[id number]}  For questions or updates, please contact [facility] Please consult [email or web address] for contact info under    Signed, Karl Ito, MD  05/24/2023 10:35 PM

## 2023-05-25 ENCOUNTER — Observation Stay (HOSPITAL_COMMUNITY): Payer: BC Managed Care – PPO

## 2023-05-25 DIAGNOSIS — A4101 Sepsis due to Methicillin susceptible Staphylococcus aureus: Secondary | ICD-10-CM | POA: Diagnosis not present

## 2023-05-25 DIAGNOSIS — J9621 Acute and chronic respiratory failure with hypoxia: Secondary | ICD-10-CM | POA: Diagnosis not present

## 2023-05-25 LAB — BLOOD CULTURE ID PANEL (REFLEXED) - BCID2

## 2023-05-25 LAB — RESPIRATORY PANEL BY PCR

## 2023-05-25 LAB — DIGOXIN LEVEL: Digoxin Level: 0.9 ng/mL (ref 0.8–2.0)

## 2023-05-25 LAB — CBC WITH DIFFERENTIAL/PLATELET
Abs Immature Granulocytes: 0.14 10*3/uL — ABNORMAL HIGH (ref 0.00–0.07)
Basophils Absolute: 0.1 10*3/uL (ref 0.0–0.1)
Basophils Relative: 0 %
Eosinophils Absolute: 0 10*3/uL (ref 0.0–0.5)
Eosinophils Relative: 0 %
HCT: 27.7 % — ABNORMAL LOW (ref 39.0–52.0)
Hemoglobin: 8.6 g/dL — ABNORMAL LOW (ref 13.0–17.0)
Immature Granulocytes: 1 %
Lymphocytes Relative: 2 %
Lymphs Abs: 0.3 10*3/uL — ABNORMAL LOW (ref 0.7–4.0)
MCH: 31 pg (ref 26.0–34.0)
MCHC: 31 g/dL (ref 30.0–36.0)
MCV: 100 fL (ref 80.0–100.0)
Monocytes Absolute: 0.6 10*3/uL (ref 0.1–1.0)
Monocytes Relative: 3 %
Neutro Abs: 18.2 10*3/uL — ABNORMAL HIGH (ref 1.7–7.7)
Neutrophils Relative %: 94 %
Platelets: 200 10*3/uL (ref 150–400)
RBC: 2.77 MIL/uL — ABNORMAL LOW (ref 4.22–5.81)
RDW: 15.7 % — ABNORMAL HIGH (ref 11.5–15.5)
WBC: 19.3 10*3/uL — ABNORMAL HIGH (ref 4.0–10.5)
nRBC: 0 % (ref 0.0–0.2)

## 2023-05-25 LAB — BASIC METABOLIC PANEL
Anion gap: 15 (ref 5–15)
BUN: 20 mg/dL (ref 6–20)
CO2: 21 mmol/L — ABNORMAL LOW (ref 22–32)
Calcium: 10 mg/dL (ref 8.9–10.3)
Chloride: 98 mmol/L (ref 98–111)
Creatinine, Ser: 8.19 mg/dL — ABNORMAL HIGH (ref 0.61–1.24)
GFR, Estimated: 8 mL/min — ABNORMAL LOW (ref >60)
Glucose, Bld: 120 mg/dL — ABNORMAL HIGH (ref 70–99)
Potassium: 4.4 mmol/L (ref 3.5–5.1)
Sodium: 134 mmol/L — ABNORMAL LOW (ref 135–145)

## 2023-05-25 LAB — LACTIC ACID, PLASMA
Lactic Acid, Venous: 3.2 mmol/L (ref 0.5–1.9)
Lactic Acid, Venous: 3.2 mmol/L (ref 0.5–1.9)
Lactic Acid, Venous: 3.4 mmol/L (ref 0.5–1.9)
Lactic Acid, Venous: 3.6 mmol/L (ref 0.5–1.9)
Lactic Acid, Venous: 3.8 mmol/L (ref 0.5–1.9)
Lactic Acid, Venous: 4.3 mmol/L (ref 0.5–1.9)

## 2023-05-25 LAB — PHOSPHORUS: Phosphorus: 3.5 mg/dL (ref 2.5–4.6)

## 2023-05-25 MED ORDER — LACTATED RINGERS IV BOLUS
250.0000 mL | Freq: Once | INTRAVENOUS | Status: AC
  Administered 2023-05-25: 250 mL via INTRAVENOUS

## 2023-05-25 MED ORDER — CEFAZOLIN SODIUM-DEXTROSE 1-4 GM/50ML-% IV SOLN
1.0000 g | INTRAVENOUS | Status: DC
  Administered 2023-05-25 – 2023-05-30 (×6): 1 g via INTRAVENOUS
  Filled 2023-05-25 (×8): qty 50

## 2023-05-25 MED ORDER — LACTATED RINGERS IV BOLUS
500.0000 mL | Freq: Once | INTRAVENOUS | Status: AC
  Administered 2023-05-25: 500 mL via INTRAVENOUS

## 2023-05-25 MED ORDER — PENTAFLUOROPROP-TETRAFLUOROETH EX AERO: 1.0000 | INHALATION_SPRAY | CUTANEOUS | Status: DC | PRN

## 2023-05-25 MED ORDER — LACTATED RINGERS IV BOLUS: 500.0000 mL | Freq: Once | INTRAVENOUS | Status: DC

## 2023-05-25 MED ORDER — CEFAZOLIN SODIUM-DEXTROSE 1-4 GM/50ML-% IV SOLN
1.0000 g | INTRAVENOUS | Status: DC
  Filled 2023-05-25: qty 50

## 2023-05-25 MED ORDER — HEPARIN SODIUM (PORCINE) 1000 UNIT/ML IJ SOLN
INTRAMUSCULAR | Status: AC
  Filled 2023-05-25: qty 4

## 2023-05-25 NOTE — Consult Note (Signed)
[facility]  Date of Admission:  05/18/2023     Total days of antibiotics: 2          ASSESSMENT: This is a 37 year old male with past medical history of A-fib on Eliquis 2.5 mg twice daily, heart failure with reduced ejection fraction, severe obesity, ESRD on dialysis MWF, chronic hypoxic respiratory failure on 5 L nasal cannula at baseline who presented to ED after missing a dialysis session for 1 week, last HD session on 10/16.  Upon arrival to ED, he was found to be hypoxic with 1+ pitting edema bilaterally of the lower extremity up to shins.  Patient was evaluated at bedside today, patient reports fevers though no active joint pains.  Patient denies any tenderness along the catheter site and previous IV sites. No active infectious rashes or lesions. No drainage, redness or swelling noted at the pressure wounds on his buttocks. No signs of infection noted at the posterior L thigh. Blood cultures 2/2 on 10/29 detected Staph aureus (BCID), MSSA.  TTE pending. Plan to transitioned to cefazolin and removal of the [facility] per IR with line holiday.   PLAN: Discontinue cefepime and transition to cefazolin Plan for removal of the TDC per IR, then 72 hours line holiday  Will follow TTE.  Remaining medical and supportive care by the primary team   Principal Problem:   Sepsis ([name]) Active Problems:   ESRD (end stage renal disease) on dialysis ([name])   Congestive heart failure ([name])   Acute on chronic hypoxic respiratory failure ([name])   Pressure injury of skin   Scheduled Meds:  Chlorhexidine Gluconate Cloth  6 each Topical Q0600   darbepoetin (ARANESP) injection - DIALYSIS  100 mcg Subcutaneous Q Sat-1800   digoxin  0.125 mg Oral Once per day on Monday Wednesday Friday   feeding supplement (NEPRO CARB STEADY)  237 mL Oral BID BM   gabapentin  300 mg Oral Q M,W,F-HD   Gerhardt's butt cream   Topical TID   lanthanum  2,000 mg Oral TID WC   leptospermum  manuka honey  1 Application Topical Daily   levothyroxine  25 mcg Oral Q0600   midodrine  30 mg Oral TID WC   urea   Topical Daily   Continuous Infusions:   ceFAZolin (ANCEF) IV     lactated ringers Stopped (05/25/23 0855)   PRN Meds:.acetaminophen **OR** acetaminophen, docusate sodium, ondansetron (ZOFRAN) IV, oxyCODONE, pentafluoroprop-tetrafluoroeth, polyethylene glycol   SUBJECTIVE: This is a 37 year old male with past medical history of A-fib on Eliquis 2.5 mg twice daily, heart failure with reduced ejection fraction, severe obesity, ESRD on dialysis MWF, chronic hypoxic respiratory failure on 5 L nasal cannula at baseline who presented to ED after missing a dialysis session for 1 week, last HD session on 10/16.  Upon arrival to ED, he was found to be hypoxic with 1+ pitting edema bilaterally of the lower extremity up to shins.  Patient was evaluated at bedside today, patient reports fevers though no active joint pains.  Patient denies any tenderness along the catheter site and previous IV sites. No active infectious rashes or lesions. No drainage, redness or swelling noted at the pressure wounds on his buttocks. No signs of infection noted at the posterior L thigh.   Review of Systems: Review of Systems  Constitutional:  Positive for fever and malaise/fatigue.  Cardiovascular:  Negative for chest pain.  Musculoskeletal:  Negative for back pain and joint pain.  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  Physical Exam General: Morbidly Obese, no acute distress Heart: Regular rate, irregular rhythm  Abd: soft, NT MSK: No TTP at the joints, No TTP at the HD catheter site  Skin: +pressure sores gluteal region, though no signs of infection. +rash/lesion posterior L thigh, not tender or infected   Lab Results Lab Results  Component Value Date   WBC 19.3 (H) 05/25/2023   HGB 8.6 (L) 05/25/2023   HCT 27.7 (L) 05/25/2023   MCV 100.0 05/25/2023   PLT 200 05/25/2023    Lab Results  Component Value Date   CREATININE 8.19 (H) 05/25/2023   BUN 20 05/25/2023   NA 134 (L) 05/25/2023   K 4.4 05/25/2023   CL 98 05/25/2023   CO2 21 (L) 05/25/2023    Lab Results  Component Value Date   ALT 26 05/18/2023   AST 41 05/18/2023   ALKPHOS 149 (H) 05/18/2023   BILITOT 0.7 05/18/2023     Microbiology: Recent Results (from the past 240 hour(s))  Culture, blood (Routine X 2) w Reflex to ID Panel     Status: None (Preliminary result)   Collection Time: 05/24/23  7:14 PM   Specimen: BLOOD RIGHT ARM  Result Value Ref Range Status   Specimen Description BLOOD RIGHT ARM  Final   Special Requests   Final    BOTTLES DRAWN AEROBIC ONLY Blood Culture adequate volume   Culture  Setup Time   Final    GRAM POSITIVE COCCI IN CLUSTERS AEROBIC BOTTLE ONLY CRITICAL RESULT CALLED TO, READ BACK BY AND VERIFIED WITH: PHARMD R.WEINGARTEN AT 0940 ON 05/25/2023 BY T.SAAD. Performed at [facility], [address], [place], [place] [address]    Culture GRAM POSITIVE COCCI  Final   Report Status PENDING  Incomplete  Blood Culture ID Panel (Reflexed)     Status: Abnormal   Collection Time: 05/24/23  7:14 PM  Result Value Ref Range Status   Enterococcus faecalis  NOT DETECTED NOT DETECTED Final   Enterococcus Faecium NOT DETECTED NOT DETECTED Final   Listeria monocytogenes NOT DETECTED NOT DETECTED Final   Staphylococcus species DETECTED (A) NOT DETECTED Final    Comment: CRITICAL RESULT CALLED TO, READ BACK BY AND VERIFIED WITH: PHARMD R.WEINGARTEN AT 0940 ON 05/25/2023 BY T.SAAD.    Staphylococcus aureus (BCID) DETECTED (A) NOT DETECTED Final    Comment: CRITICAL RESULT CALLED TO, READ BACK BY AND VERIFIED WITH: PHARMD R.WEINGARTEN AT 0940 ON 05/25/2023 BY T.SAAD.    Staphylococcus epidermidis NOT DETECTED NOT  DETECTED Final   Staphylococcus lugdunensis NOT DETECTED NOT DETECTED Final   Streptococcus species NOT DETECTED NOT DETECTED Final   Streptococcus agalactiae NOT DETECTED NOT DETECTED Final   Streptococcus pneumoniae NOT DETECTED NOT DETECTED Final   Streptococcus pyogenes NOT DETECTED NOT DETECTED Final   A.calcoaceticus-baumannii NOT DETECTED NOT DETECTED Final   Bacteroides fragilis NOT DETECTED NOT DETECTED Final   Enterobacterales NOT DETECTED NOT DETECTED Final   Enterobacter cloacae complex NOT DETECTED NOT DETECTED Final   Escherichia coli NOT DETECTED NOT DETECTED Final   Klebsiella aerogenes NOT DETECTED NOT DETECTED Final   Klebsiella oxytoca NOT DETECTED NOT DETECTED Final   Klebsiella pneumoniae NOT DETECTED NOT DETECTED Final   Proteus species NOT DETECTED NOT DETECTED Final   Salmonella species NOT DETECTED NOT DETECTED Final   Serratia marcescens NOT DETECTED NOT DETECTED Final   Haemophilus influenzae NOT DETECTED NOT DETECTED Final   Neisseria meningitidis NOT DETECTED NOT DETECTED Final   Pseudomonas aeruginosa NOT DETECTED NOT DETECTED Final   Stenotrophomonas maltophilia NOT DETECTED NOT DETECTED Final   Candida albicans NOT DETECTED NOT DETECTED Final   Candida auris NOT DETECTED NOT DETECTED Final   Candida glabrata NOT DETECTED NOT DETECTED Final   Candida krusei NOT DETECTED NOT DETECTED Final   Candida  parapsilosis NOT DETECTED NOT DETECTED Final   Candida tropicalis NOT DETECTED NOT DETECTED Final   Cryptococcus neoformans/gattii NOT DETECTED NOT DETECTED Final   Meth resistant mecA/C and MREJ NOT DETECTED NOT DETECTED Final    Comment: Performed at [facility], [address], [place], [place] [address]  Culture, blood (Routine X 2) w Reflex to ID Panel     Status: None (Preliminary result)   Collection Time: 05/24/23  7:20 PM   Specimen: BLOOD RIGHT HAND  Result Value Ref Range Status   Specimen Description BLOOD RIGHT HAND  Final   Special Requests   Final    BOTTLES DRAWN AEROBIC ONLY Blood Culture adequate volume   Culture  Setup Time   Final    GRAM POSITIVE COCCI IN CLUSTERS AEROBIC BOTTLE ONLY CRITICAL VALUE NOTED.  VALUE IS CONSISTENT WITH PREVIOUSLY REPORTED AND CALLED VALUE. Performed at [facility], [address], [place], [place] [address]    Culture GRAM POSITIVE COCCI  Final   Report Status PENDING  Incomplete  Respiratory (~20 pathogens) panel by PCR     Status: None   Collection Time: 05/25/23  1:45 AM   Specimen: Nasopharyngeal Swab; Respiratory  Result Value Ref Range Status   Adenovirus NOT DETECTED NOT DETECTED Final   Coronavirus 229E NOT DETECTED NOT DETECTED Final    Comment: (NOTE) The Coronavirus on the Respiratory Panel, DOES NOT test for the novel  Coronavirus (2019 nCoV)    Coronavirus HKU1 NOT DETECTED NOT DETECTED Final   Coronavirus NL63 NOT DETECTED NOT DETECTED Final   Coronavirus OC43 NOT DETECTED NOT DETECTED Final   Metapneumovirus NOT DETECTED NOT DETECTED Final   Rhinovirus / Enterovirus NOT DETECTED NOT DETECTED Final   Influenza A NOT DETECTED NOT DETECTED Final   Influenza B NOT DETECTED NOT DETECTED Final   Parainfluenza Virus 1 NOT DETECTED NOT DETECTED Final   Parainfluenza Virus 2 NOT DETECTED NOT DETECTED Final   Parainfluenza Virus 3 NOT DETECTED NOT DETECTED Final   Parainfluenza Virus 4 NOT DETECTED NOT  DETECTED Final   Respiratory Syncytial Virus NOT DETECTED NOT DETECTED Final   Bordetella pertussis NOT DETECTED NOT DETECTED Final   Bordetella  Parapertussis NOT DETECTED NOT DETECTED Final   Chlamydophila pneumoniae NOT DETECTED NOT DETECTED Final   Mycoplasma pneumoniae NOT DETECTED NOT DETECTED Final    Comment: Performed at [facility], [address], [place], [place] [address]    Jeral Pinch, DO, PGY 1 [facility] [phone] pager   [phone] cell 05/25/2023, 12:45 PM

## 2023-05-25 NOTE — Procedures (Signed)
HD Note:  Some information was entered later than the data was gathered due to patient care needs. The stated time with the data is accurate.  Received patient in bed to unit.   Alert and oriented.   Informed consent signed and in chart.   Access used: Right upper chest Access issues: No issues  Patient tolerated treatment well.  BP was low at times, patient was asymptomatic.  Patient slept during most of treatment.    TX duration:4 hours  Alert, without acute distress.  Total UF removed: 5000 ml  Hand-off given to patient's nurse.   Transported back to the room   Idonna Heeren L. Dareen Piano, RN Kidney Dialysis Unit.

## 2023-05-25 NOTE — Progress Notes (Signed)
MEWS Progress Note  Patient Details Name: Sayre Castles MRN: [id number] DOB: 10/17/1985 Today's Date: 05/25/2023   MEWS Flowsheet Documentation:  Assess: MEWS Score Temp: 100.2 F (37.9 C) BP: (!) 111/97 MAP (mmHg): 104 Pulse Rate: 98 ECG Heart Rate: 92 Resp: 19 Level of Consciousness: Alert SpO2: 96 % O2 Device: Nasal Cannula O2 Flow Rate (L/min): 5 L/min Assess: MEWS Score MEWS Temp: 0 MEWS Systolic: 0 MEWS Pulse: 0 MEWS RR: 0 MEWS LOC: 0 MEWS Score: 0 MEWS Score Color: Green Assess: SIRS CRITERIA SIRS Temperature : 0 SIRS Respirations : 0 SIRS Pulse: 1 SIRS WBC: 1 SIRS Score Sum : 2 SIRS Temperature : 0 SIRS Pulse: 0 SIRS Respirations : 0 SIRS WBC: 0 SIRS Score Sum : 0 Assess: if the MEWS score is Yellow or Red Were vital signs accurate and taken at a resting state?: Yes Does the patient meet 2 or more of the SIRS criteria?: Yes Does the patient have a confirmed or suspected source of infection?: Yes MEWS guidelines implemented : Yes, red Treat MEWS Interventions: Considered administering scheduled or prn medications/treatments as ordered Take Vital Signs Increase Vital Sign Frequency : Red: Q1hr x2, continue Q4hrs until patient remains green for 12hrs Escalate MEWS: Escalate: Red: Discuss with charge nurse and notify provider. Consider notifying RRT. If remains red for 2 hours consider need for higher level of care        Denton Meek 05/25/2023, 8:12 AM

## 2023-05-25 NOTE — Progress Notes (Signed)
PHARMACY - PHYSICIAN COMMUNICATION CRITICAL VALUE ALERT - BLOOD CULTURE IDENTIFICATION (BCID)  Phillip Young is an 37 y.o. male who presented to [facility] on 05/18/2023 with a chief complaint of edema / missing HD.  Assessment: 2/2 blood cultures with GPCs, MSSA on BCID.   Name of physician (or Provider) Contacted: Dr. Debe Coder and Dr. Netta Corrigan  Current antibiotics: Vancomycin/cefepime  Changes to prescribed antibiotics recommended:  ID auto consulted, to see patient. Patient transitioned to cefazolin, has tolerated cephalosporins despite reported amoxicillin allergy.  Results for orders placed or performed during the hospital encounter of 05/18/23  Blood Culture ID Panel (Reflexed) (Collected: 05/24/2023  7:14 PM)  Result Value Ref Range   Enterococcus faecalis NOT DETECTED NOT DETECTED   Enterococcus Faecium NOT DETECTED NOT DETECTED   Listeria monocytogenes NOT DETECTED NOT DETECTED   Staphylococcus species DETECTED (A) NOT DETECTED   Staphylococcus aureus (BCID) DETECTED (A) NOT DETECTED   Staphylococcus epidermidis NOT DETECTED NOT DETECTED   Staphylococcus lugdunensis NOT DETECTED NOT DETECTED   Streptococcus species NOT DETECTED NOT DETECTED   Streptococcus agalactiae NOT DETECTED NOT DETECTED   Streptococcus pneumoniae NOT DETECTED NOT DETECTED   Streptococcus pyogenes NOT DETECTED NOT DETECTED   A.calcoaceticus-baumannii NOT DETECTED NOT DETECTED   Bacteroides fragilis NOT DETECTED NOT DETECTED   Enterobacterales NOT DETECTED NOT DETECTED   Enterobacter cloacae complex NOT DETECTED NOT DETECTED   Escherichia coli NOT DETECTED NOT DETECTED   Klebsiella aerogenes NOT DETECTED NOT DETECTED   Klebsiella oxytoca NOT DETECTED NOT DETECTED   Klebsiella pneumoniae NOT DETECTED NOT DETECTED   Proteus species NOT DETECTED NOT DETECTED   Salmonella species NOT DETECTED NOT DETECTED   Serratia marcescens NOT DETECTED NOT DETECTED   Haemophilus influenzae NOT DETECTED NOT  DETECTED   Neisseria meningitidis NOT DETECTED NOT DETECTED   Pseudomonas aeruginosa NOT DETECTED NOT DETECTED   Stenotrophomonas maltophilia NOT DETECTED NOT DETECTED   Candida albicans NOT DETECTED NOT DETECTED   Candida auris NOT DETECTED NOT DETECTED   Candida glabrata NOT DETECTED NOT DETECTED   Candida krusei NOT DETECTED NOT DETECTED   Candida parapsilosis NOT DETECTED NOT DETECTED   Candida tropicalis NOT DETECTED NOT DETECTED   Cryptococcus neoformans/gattii NOT DETECTED NOT DETECTED   Meth resistant mecA/C and MREJ NOT DETECTED NOT DETECTED    [name] 05/25/2023  9:51 AM

## 2023-05-25 NOTE — Progress Notes (Addendum)
HD#1 SUBJECTIVE:  Patient Summary: Phillip Young is a 37 y.o. with a pertinent PMH of ESRD on MWF HD, HFrEF, chronic hypoxic respiratory failure on 5L [place], Afib on Eliquis who presented with missed dialysis and dyspnea and admitted for acute on chronic hypoxic respiratory failure.     Overnight Events: Some hypotension, received LR and additional intervention per below. Mentation intact, still general malaise but rigors resolved.  Interim History: PT was set for discharge to home yesterday evening, but then developed chills, myalgias, fevers measured 101 and 103, tachycardia. This was evaluated with CXR, blood cultures, and he was given LR and vanc/cefepime for concern of septic shock. Denies CP, SOB, cough, N/V, abdominal pain, syncope. Found to have neutrophilia. Cardiology consulted for Afib RVR.   OBJECTIVE:  Vital Signs: Vitals:   05/25/23 0406 05/25/23 0718 05/25/23 0747 05/25/23 1124  BP: (!) 100/46 (!) 86/38 (!) 111/97 (!) 105/45  Pulse: 66 91 98 90  Resp: 20 19 19 20   Temp: 98.9 F (37.2 C) 98.7 F (37.1 C) 100.2 F (37.9 C) 100.2 F (37.9 C)  TempSrc:   Oral Oral  SpO2: 100% 99% 96% 100%  Weight:      Height:       Supplemental O2: Nasal Cannula SpO2: 100 % O2 Flow Rate (L/min): 5 L/min  Filed Weights   05/21/23 1156 05/23/23 0850 05/23/23 1356  Weight: (!) 167.5 kg (!) 166.2 kg (!) 161.3 kg     Intake/Output Summary (Last 24 hours) at 05/25/2023 1147 Last data filed at 05/25/2023 0750 Gross per 24 hour  Intake 2865.83 ml  Output 150 ml  Net 2715.83 ml   Net IO Since Admission: -10,495.37 mL [05/25/23 1147]  Physical Exam: Physical Exam Constitutional:      General: He is not in acute distress.    Appearance: Normal appearance. He is obese. He is not ill-appearing.  Neck:     Comments: Internal jugular HD port in place. There is mild generalized tenderness. There is no gross purulence, erythema, bleeding, ulceration. Cardiovascular:     Rate and  Rhythm: Normal rate and regular rhythm.     Pulses: Normal pulses.  Pulmonary:     Effort: Pulmonary effort is normal.     Breath sounds: Normal breath sounds.  Abdominal:     General: Abdomen is flat.     Palpations: Abdomen is soft.     Tenderness: There is no abdominal tenderness.  Musculoskeletal:     Right lower leg: No edema.     Left lower leg: No edema.     Comments: Ulcerative pressure wounds present on L gluteus. Appear well healing, no purulence, erythema, bleeding.  Skin:    General: Skin is warm and dry.     Capillary Refill: Capillary refill takes less than 2 seconds.  Neurological:     General: No focal deficit present.     Mental Status: He is alert and oriented to person, place, and time.  Psychiatric:        Mood and Affect: Mood normal.        Behavior: Behavior normal.     Patient Lines/Drains/Airways Status     Active Line/Drains/Airways     Name Placement date Placement time Site Days   Peripheral IV 05/24/23 20 G 1.75" Anterior;Right Forearm 05/24/23  1958  Forearm  1   Hemodialysis Catheter Right Internal jugular Double lumen Permanent (Tunneled) 03/09/23  0933  Internal jugular  77   Pressure Injury 02/03/23 Coccyx Mid Stage  2 -  Partial thickness loss of dermis presenting as a shallow open injury with a red, pink wound bed without slough. 02/03/23  0800  -- 111   Pressure Injury 05/18/23 Buttocks Right Stage 3 -  Full thickness tissue loss. Subcutaneous fat may be visible but bone, tendon or muscle are NOT exposed. 05/18/23  2300  -- 7   Pressure Injury 05/18/23 Buttocks Left Stage 2 -  Partial thickness loss of dermis presenting as a shallow open injury with a red, pink wound bed without slough. 05/18/23  2300  -- 7             ASSESSMENT/PLAN:  Assessment: Principal Problem:   Acute on chronic hypoxic respiratory failure ([name]) Active Problems:   ESRD (end stage renal disease) on dialysis ([name])   Sepsis ([name])   Congestive heart failure  ([name])   Phillip Young is a 37 y.o. man with a pertinent PMH of ESRD on MWF HD, HFrEF, chronic hypoxic respiratory failure on 5L [name], Afib on Eliquis who presented with shortness of breath, and admitted for acute on chronic RF and volume overload due to missed dialysis.   MSSA Bacteremia Pt was to be discharged yesterday before becoming ill with rigors, fever, tachycardia, hypotension. Overnight there was hypotension, treated with fluids, Vanc/Cefepime. There is persistent lactic acidosis but he is ESRD, likely it is improving as it has not risen dramatically throughout day leading to dialysis. Mentation is intact. Pt symptoms are improving, still some intermittent chills and generalized joint pains. Cultures positive for MSSA. Likely source is HD port. ID is involved, thank you. Vanc/Cefepime have been stopped and he is starting cefazolin. Plan to remove TDC per IR after today's dialysis and initiate 72 hour line holiday. - Cefazolin 1g IV 123ml/hr q 24h  - IR to change HD port and then 72hr line holiday - TTE pending   Paroxysmal Afib RVR Chronic Afib on Anticoagulation Pt went into RVR overnight in the setting of his sepsis. Cardiology was consulted and he was evaluated overnight - might need higher digoxin if rates become uncontrolled but no aggressive rate controls in setting of sepsis compensation. Fortunately, rate improved. This patient is refractory to cardioversion x 3 and, and not on amiodarone due to amiodarone toxicity. Beta blockers poor choice due to hypotension. Chronic anticoagulated with Eliquis 5 mg. He is asymptomatic. - Continue Eliquis 5mg  every day. Was at reduced dose previously due to anal fissures, but it resolved. Will monitor. Pt agreeable to full standard dosing.  Volume overload ESRD on HD MWF Chronic Hypotension Pt stable, weight is down, and attending dialysis without problems.  - Continue HD on MWF.  - Midodrine 30 mg 3 times daily for hypotension at HD -  Gabapentin 300 mg 3 times daily after HD for neuropathy - Fosrenol 2000 mg TID with meals - PT/ OT continue to follow, rehab not an option. SNF instead. - Social work is looking for SNF, no bed yet, thank you   Barriers to dialysis as outpatient: After discharge from previous long hospital/rehab stay, pt was unable attend dialysis multiple times as unable to get to his transportation - he cannot ambulate or safely get himself into a car. He cannot walk up stairs and his apartment is at a ground/basement level requiring him to travel around the back and up a hill which is a significant challenge for him and his helpers.   Acute on chronic hypoxic respiratory failure (resolved) HFrEF Patient wears 5 L [place] at baseline,  usually between 4-5L while here. Some transient hypoxia at admission long resolved. He does have LE edema that is slowly improving and his weight is going down - Digoxin 0.25 3 times weekly.  - Pt not on GDMT but hypotension requiring midodrine is a limiting factor. Will continue to evaluate if there is a role here. - 5L [place] is baseline, remains there   Last echo done in July with EF 20-25%, severely decreased LV function, LVH, and LV diastolic function unable to be evaluated. He has been evaluated by cardiology, not a candidate for nodal ablation, ICD or pacemaker.  He is yet to follow-up with cardiology outpatient.     Ambulatory Dysfunction Foot Drop R foot Pt with immense difficulty navigating the out-of-hospital environment due to deconditioning from his long prior hospitalization, body habitus, foot drop (R side, originated when he was sedated in ICU this summer), neuropathy. He is unable to get to outpatient dialysis in part due to this, as well as general transportation challenges. Was not able to attend any outpatient dialysis sessions since leaving rehab on 10/18. He feels somewhat more comfortable on his feet now that his swelling is reduced. - PT/OT involved, thank you.  Current recs for SNF. Recommend OP evaluation of his foot drop.   Neuropathic pain Dry feet - Oxycodone 15 mg. - gabapentin 300 after dialysis per above - Urea lotion  - Lidocaine lotion    Best Practice: Diet: Renal diet IVF: none VTE: Eliquis Code: Full AB: None Therapy Recs: SNF DISPO: Anticipated discharge in 1-5 days to SNF pending placement.  Signature: [name], D.O.  Internal Medicine Resident, PGY-1 [facility]  Pager: [phone] 11:47 AM, 05/25/2023   Please contact the on call pager after 5 pm and on weekends at [phone].

## 2023-05-25 NOTE — Progress Notes (Addendum)
[facility] Progress Note   Subjective:    Discharged cancelled. Noted patient spiked a fever with rigors and converted into Afib with RVR overnight. Now (+) for MSSA bacteremia. Seen and examined patient at bedside. He reports dizziness with movements and mild chills. Currently with low-grade fever. Plan for HD today then IR will pull his [facility] after treatment for a line holiday. Discussed with patient on updated plan. Also appears he will be transferred to 6E.  Objective Vitals:   05/25/23 0406 05/25/23 0718 05/25/23 0747 05/25/23 1124  BP: (!) 100/46 (!) 86/38 (!) 111/97 (!) 105/45  Pulse: 66 91 98 90  Resp: 20 19 19 20   Temp: 98.9 F (37.2 C) 98.7 F (37.1 C) 100.2 F (37.9 C) 100.2 F (37.9 C)  TempSrc:   Oral Oral  SpO2: 100% 99% 96% 100%  Weight:      Height:       Physical Exam General: Chronically ill-appearing, Obese. NAD. Nasal O2 in place. Heart: Irregular, no murmurs, gallops, or rubs Lungs: Clear anteriorly Abdomen: soft, non-tender Extremities: Trace BLE edema with tenderness to palpation Dialysis Access: TDC in R chest  Filed Weights   05/21/23 1156 05/23/23 0850 05/23/23 1356  Weight: (!) 167.5 kg (!) 166.2 kg (!) 161.3 kg    Intake/Output Summary (Last 24 hours) at 05/25/2023 1140 Last data filed at 05/25/2023 0750 Gross per 24 hour  Intake 2865.83 ml  Output 150 ml  Net 2715.83 ml    Additional Objective Labs: Basic Metabolic Panel: Recent Labs  Lab 05/21/23 0710 05/22/23 0501 05/24/23 0445 05/24/23 1914 05/25/23 0446  NA 135   < > 135 135 134*  K 5.5*   < > 4.2 4.5 4.4  CL 97*   < > 97* 98 98  CO2 26   < > 24 24 21*  GLUCOSE 73   < > 87 119* 120*  BUN 38*   < > 14 17 20   CREATININE 10.65*   < > 6.71* 7.74* 8.19*  CALCIUM 10.2   < > 10.5* 10.4* 10.0  PHOS 6.7*  --   --  3.1 3.5   < > = values in this interval not displayed.   Liver Function Tests: Recent Labs  Lab 05/18/23 1729 05/19/23 0451 05/20/23 1422  05/21/23 0710 05/24/23 1914  AST 41  --   --   --   --   ALT 26  --   --   --   --   ALKPHOS 149*  --   --   --   --   BILITOT 0.7  --   --   --   --   PROT 6.7  --   --   --   --   ALBUMIN 2.8*   < > 2.5* 2.6* 3.0*   < > = values in this interval not displayed.   No results for input(s): "LIPASE", "AMYLASE" in the last 168 hours. CBC: Recent Labs  Lab 05/18/23 1729 05/19/23 0451 05/21/23 0711 05/22/23 0501 05/23/23 0446 05/24/23 1914 05/25/23 0446  WBC 7.2   < > 5.8 6.1 5.4 10.6* 19.3*  NEUTROABS 4.7  --   --   --   --  9.7* 18.2*  HGB 9.3*   < > 8.2* 8.3* 10.2* 9.6* 8.6*  HCT 30.3*   < > 27.1* 26.6* 34.3* 31.3* 27.7*  MCV 105.2*   < > 101.5* 101.1* 101.5* 100.6* 100.0  PLT 253   < > 246 248 290  240 200   < > = values in this interval not displayed.   Blood Culture    Component Value Date/Time   SDES BLOOD RIGHT HAND 05/24/2023 1920   SPECREQUEST  05/24/2023 1920    BOTTLES DRAWN AEROBIC ONLY Blood Culture adequate volume   CULT GRAM POSITIVE COCCI 05/24/2023 1920   REPTSTATUS PENDING 05/24/2023 1920    Cardiac Enzymes: No results for input(s): "CKTOTAL", "CKMB", "CKMBINDEX", "TROPONINI" in the last 168 hours. CBG: No results for input(s): "GLUCAP" in the last 168 hours. Iron Studies: No results for input(s): "IRON", "TIBC", "TRANSFERRIN", "FERRITIN" in the last 72 hours. Lab Results  Component Value Date   INR 2.0 (H) 06/10/2022   INR 2.8 (H) 06/09/2022   INR 2.9 (H) 06/07/2022   Studies/Results: DG CHEST PORT 1 VIEW  Result Date: 05/24/2023 CLINICAL DATA:  [id number] Dyspnea [id number] EXAM: PORTABLE CHEST 1 VIEW COMPARISON:  Chest x-ray 05/18/2023 FINDINGS: Right chest wall dialysis catheter with tip overlying the right atrium. Enlarged cardiac silhouette. The heart and mediastinal contours are unchanged. No focal consolidation. No pulmonary edema. No pleural effusion. No pneumothorax. No acute osseous abnormality. IMPRESSION: 1. Persistent cardiomegaly with  underlying pericardial effusion not excluded. 2. No acute cardiopulmonary abnormality. Electronically Signed   By: Tish Frederickson M.D.   On: 05/24/2023 22:36    Medications:   ceFAZolin (ANCEF) IV     lactated ringers Stopped (05/25/23 0855)    apixaban  5 mg Oral BID   Chlorhexidine Gluconate Cloth  6 each Topical Q0600   darbepoetin (ARANESP) injection - DIALYSIS  100 mcg Subcutaneous Q Sat-1800   digoxin  0.125 mg Oral Once per day on Monday Wednesday Friday   feeding supplement (NEPRO CARB STEADY)  237 mL Oral BID BM   gabapentin  300 mg Oral Q M,W,F-HD   Gerhardt's butt cream   Topical TID   lanthanum  2,000 mg Oral TID WC   leptospermum manuka honey  1 Application Topical Daily   levothyroxine  25 mcg Oral Q0600   midodrine  30 mg Oral TID WC   urea   Topical Daily    Dialysis Orders: MWF at [facility] 4.5hr, 400/800, EDW 172kg, 2K/2Ca bath, TDC, no heparin (+ HIT)  Assessment/Plan: 1. Acute Hypoxic RF: In setting of missed HD due to transportation issues. Improving. 2. MSSA Bacteremia: on IV Vanc and Cefepime. IR consulted. Plan for [facility] removal after today's HD for line holiday. 3. Paroxysmal A-fib RVR: Cardiology on board. On Eliquis and Digoxin. Not a candidate for Amiodarone 2nd toxicity and elevated LFTs. Not a candidate for ablation or pacemaker 2nd infection risk. 4. ESRD: Back on MWF - next HD 10/30. Maximizing UF as tolerated. + HIT - no heparin. Renal US showed no hydronephrosis and increasing echogenicity consistent with hepatic steatosis. See #2 5. HypoTN/volume: BP stable on high dose midodrine (30mg  TID), lots of edema still, will need EDW lowered. 6. Anemia: Hgb 8.6 - continue Aranesp q Sat while here. 7. Secondary hyperparathyroidism: CorrCa high, Phos high - continue lanthanum binders. No VDRA. Check PTH. 8. Nutrition: Alb low, continue supps. 9. HFrEF: On digoxin + Eliquis. 10. Obesity  Salome Holmes, NP [facility] 05/25/2023,11:40  AM  LOS: 1 day

## 2023-05-25 NOTE — Progress Notes (Signed)
Contacted [facility] this morning to advise clinic that pt's d/c was cancelled yesterday. Will assist as needed.   Olivia Canter Renal Navigator [phone]

## 2023-05-25 NOTE — Progress Notes (Addendum)
Patient Name: Phillip Young Date of Encounter: 05/25/2023 [facility] Cardiologist: Marca Ancona, MD   Interval Summary  .    Very somnolent on my exam today.  Minimal complaints.  Occasionally feels his palpitations.  Rate controlled with heart rates in the 80s.  Vital Signs .    Vitals:   05/25/23 0022 05/25/23 0406 05/25/23 0718 05/25/23 0747  BP: (!) 87/52 (!) 100/46 (!) 86/38 (!) 111/97  Pulse: (!) 107 66 91 98  Resp: (!) 25 20 19 19   Temp: (!) 101.4 F (38.6 C) 98.9 F (37.2 C) 98.7 F (37.1 C) 100.2 F (37.9 C)  TempSrc: Oral   Oral  SpO2: 95% 100% 99% 96%  Weight:      Height:        Intake/Output Summary (Last 24 hours) at 05/25/2023 0916 Last data filed at 05/25/2023 0747 Gross per 24 hour  Intake 2511.83 ml  Output 150 ml  Net 2361.83 ml      05/23/2023    1:56 PM 05/23/2023    8:50 AM 05/21/2023   11:56 AM  Last 3 Weights  Weight (lbs) 355 lb 9.6 oz 366 lb 6.5 oz 369 lb 4.3 oz  Weight (kg) 161.3 kg 166.2 kg 167.5 kg      Telemetry/ECG    Atrial fibrillation heart rate 80-100- Personally Reviewed  CV Studies    Echocardiogram 01/25/2023 1. Left ventricular ejection fraction, by estimation, is 20 to 25%. The  left ventricle has severely decreased function. The left ventricle has no  regional wall motion abnormalities. The left ventricular internal cavity  size was mildly dilated. There is  mild concentric left ventricular hypertrophy. Left ventricular diastolic  function could not be evaluated.   2. Right ventricular systolic function is low normal. The right  ventricular size is normal. Mildly increased right ventricular wall  thickness. Tricuspid regurgitation signal is inadequate for assessing PA  pressure.   3. The mitral valve is grossly normal. Trivial mitral valve  regurgitation. No evidence of mitral stenosis.   4. The aortic valve was not well visualized. Aortic valve regurgitation  is not visualized.   5. The inferior  vena cava is dilated in size with <50% respiratory  variability, suggesting right atrial pressure of 15 mmHg.   Comparison(s): No significant change from prior study. Prior images  reviewed side by side.   Physical Exam .   GEN: No acute distress.   Neck: No JVD Cardiac: IRIR, no murmurs, rubs, or gallops.  Respiratory: Clear to auscultation bilaterally. GI: Soft, nontender, non-distended  MS: No edema  Patient Profile    [name] is a 37 y.o. male has hx of  chronic HFrEF, NICM, PAF/AFL (multiple prior DCCVs, attempted ablation 12/23), concern for amiodarone lung toxicity, chronic respiratory failure on home O2 4-5L, ESRD on HD, OSA/OHS, morbid obesity.  Initially was going to be discharged yesterday however developed fever and hypotension with concern for sepsis now started on antibiotics.  Cardiology consulted due to A-fib RVR with heart rates in the 130s.  Assessment & Plan .     Paroxysmal A-fib RVR Status post multiple DCCV's in the past which have failed attempted ablation 06/2022 with failure to maintain normal sinus rhythm.  Not a candidate for multiple antiarrhythmics.  Amiodarone previously has been deferred due to risk for pulmonary toxicity and elevated LFTs, also has been unsuccessful in the past.  Also would be an issue if used concurrently with digoxin given risk for toxicity.  RVR  secondary to sepsis now improved with IV fluids and antibiotics.  He is currently rate controlled right now with heart rates between 80-100.   Continue Eliquis 5 mg twice daily, digoxin 0.125 mg Monday Wednesday Friday. May need higher threshold for rates if they become uncontrolled.  Not a candidate for ablation or pacemaker due to infection risk.  Chronic HFrEF 20-25% Nonischemic cardiomyopathy Not on GDMT due to hypotension and ESRD.  Chronically requires midodrine.  Not a candidate for advanced heart failure therapies or ICD.  Overall appears to be euvolemic, volume status managed by  HD. Continue midodrine 30 mg 3 times daily Echo pending  Sepsis lactic 3.4 ESRD Chronic hypoxic respiratory failure Per primary team  For questions or updates, please contact [facility] Please consult [email or web address] for contact info under        Signed, Abagail Kitchens, PA-C    Personally seen and examined. Agree with above.  More alert currently Med team in room, discussion of removal dialysis catheter secondary to blood-borne infection/sepsis.  He was speaking with family member. -Continue with midodrine high doses for blood pressure support - Echocardiogram pending but would suspect very similar to prior echocardiogram with markedly reduced ejection fraction in the 20% range - Can hold Eliquis for catheter removal - Digoxin level currently reasonable at current dosing.  Team has been very careful to watch for any signs of toxicity.  Donato Schultz, MD

## 2023-05-26 ENCOUNTER — Inpatient Hospital Stay (HOSPITAL_COMMUNITY): Payer: BC Managed Care – PPO

## 2023-05-26 DIAGNOSIS — Z992 Dependence on renal dialysis: Secondary | ICD-10-CM | POA: Diagnosis not present

## 2023-05-26 DIAGNOSIS — B9561 Methicillin susceptible Staphylococcus aureus infection as the cause of diseases classified elsewhere: Secondary | ICD-10-CM | POA: Diagnosis not present

## 2023-05-26 DIAGNOSIS — I4891 Unspecified atrial fibrillation: Secondary | ICD-10-CM

## 2023-05-26 DIAGNOSIS — A4101 Sepsis due to Methicillin susceptible Staphylococcus aureus: Secondary | ICD-10-CM | POA: Diagnosis not present

## 2023-05-26 DIAGNOSIS — I480 Paroxysmal atrial fibrillation: Secondary | ICD-10-CM | POA: Diagnosis not present

## 2023-05-26 HISTORY — PX: IR REMOVAL TUN CV CATH W/O FL: IMG2289

## 2023-05-26 LAB — BASIC METABOLIC PANEL
Anion gap: 14 (ref 5–15)
BUN: 20 mg/dL (ref 6–20)
CO2: 23 mmol/L (ref 22–32)
Calcium: 9.7 mg/dL (ref 8.9–10.3)
Chloride: 93 mmol/L — ABNORMAL LOW (ref 98–111)
Creatinine, Ser: 6.25 mg/dL — ABNORMAL HIGH (ref 0.61–1.24)
GFR, Estimated: 11 mL/min — ABNORMAL LOW (ref >60)
Glucose, Bld: 101 mg/dL — ABNORMAL HIGH (ref 70–99)
Potassium: 4.1 mmol/L (ref 3.5–5.1)
Sodium: 130 mmol/L — ABNORMAL LOW (ref 135–145)

## 2023-05-26 LAB — CBC
HCT: 29.2 % — ABNORMAL LOW (ref 39.0–52.0)
Hemoglobin: 9.1 g/dL — ABNORMAL LOW (ref 13.0–17.0)
MCH: 31.4 pg (ref 26.0–34.0)
MCHC: 31.2 g/dL (ref 30.0–36.0)
MCV: 100.7 fL — ABNORMAL HIGH (ref 80.0–100.0)
Platelets: 182 10*3/uL (ref 150–400)
RBC: 2.9 MIL/uL — ABNORMAL LOW (ref 4.22–5.81)
RDW: 15.8 % — ABNORMAL HIGH (ref 11.5–15.5)
WBC: 9.3 10*3/uL (ref 4.0–10.5)
nRBC: 0 % (ref 0.0–0.2)

## 2023-05-26 LAB — ECHOCARDIOGRAM COMPLETE
AR max vel: 3.18 cm2
AV Area VTI: 2.75 cm2
AV Area mean vel: 2.97 cm2
AV Mean grad: 3 mm[Hg]
AV Peak grad: 5.3 mm[Hg]
Ao pk vel: 1.15 m/s
Area-P 1/2: 7.16 cm2
Height: 72 in
S' Lateral: 5.9 cm
Weight: 5689.63 [oz_av]

## 2023-05-26 MED ORDER — LIDOCAINE HCL 1 % IJ SOLN
10.0000 mL | Freq: Once | INTRAMUSCULAR | Status: AC
  Administered 2023-05-30: 10 mL
  Filled 2023-05-26: qty 10

## 2023-05-26 MED ORDER — APIXABAN 5 MG PO TABS
5.0000 mg | ORAL_TABLET | Freq: Two times a day (BID) | ORAL | Status: DC
  Administered 2023-05-26 – 2023-06-03 (×17): 5 mg via ORAL
  Filled 2023-05-26 (×17): qty 1

## 2023-05-26 MED ORDER — DARBEPOETIN ALFA 150 MCG/0.3ML IJ SOSY
150.0000 ug | PREFILLED_SYRINGE | INTRAMUSCULAR | Status: DC
  Administered 2023-05-28: 150 ug via SUBCUTANEOUS
  Filled 2023-05-26 (×2): qty 0.3

## 2023-05-26 MED ORDER — LIDOCAINE HCL (PF) 1 % IJ SOLN
INTRAMUSCULAR | Status: AC
  Filled 2023-05-26: qty 30

## 2023-05-26 NOTE — Progress Notes (Signed)
   Patient Name: Phillip Young Date of Encounter: 05/26/2023 [facility] Cardiologist: Marca Ancona, MD   Interval Summary  .    Feet are burning he states.  Vital Signs .    Vitals:   05/26/23 0002 05/26/23 0400 05/26/23 0500 05/26/23 0935  BP: 92/73 100/60  95/80  Pulse: 78 84 97 94  Resp: 15 20 15    Temp: 98.5 F (36.9 C) 98.7 F (37.1 C)    TempSrc: Oral Oral    SpO2: 100% 98% 100%   Weight:      Height:        Intake/Output Summary (Last 24 hours) at 05/26/2023 1020 Last data filed at 05/26/2023 0500 Gross per 24 hour  Intake 550 ml  Output 5125 ml  Net -4575 ml      05/23/2023    1:56 PM 05/23/2023    8:50 AM 05/21/2023   11:56 AM  Last 3 Weights  Weight (lbs) 355 lb 9.6 oz 366 lb 6.5 oz 369 lb 4.3 oz  Weight (kg) 161.3 kg 166.2 kg 167.5 kg      Telemetry/ECG    Atrial fibrillation heart rate in the 80s- Personally Reviewed  Physical Exam .   GEN: No acute distress.  Dialysis catheter has been removed Neck: No JVD Cardiac: Irregularly irregular no murmurs, rubs, or gallops.  Respiratory: Clear to auscultation bilaterally. GI: Soft, nontender, non-distended  MS: Mild chronic lower extremity edema  Assessment & Plan .     37 year old male with end-stage renal disease on hemodialysis with bacteremia, removal of dialysis catheter on 05/26/2023, persistent atrial fibrillation status post multiple cardioversions in the past with attempted failed ablation, morbid obesity, chronic systolic heart failure with ejection fraction of 20% secondary to nonischemic cardiomyopathy, chronic hypotension  -Resume Eliquis when able to from a hemodialysis catheter procedural perspective  Continuing with digoxin 0.125 mg 3 times a week-level reassuring.  Midodrine 30 mg 3 times a day for blood pressure support.  Unable to utilize goal-directed medical therapy secondary to hypotension as well as end-stage renal disease.  Not a candidate for advanced  therapies/ICD.  Battling with peripheral neuropathy.  For questions or updates, please contact [facility] Please consult [email or web address] for contact info under        Signed, Donato Schultz, MD

## 2023-05-26 NOTE — Plan of Care (Signed)
  Problem: Education: Goal: Knowledge of General Education information will improve Description: Including pain rating scale, medication(s)/side effects and non-pharmacologic comfort measures Outcome: Progressing   Problem: Health Behavior/Discharge Planning: Goal: Ability to manage health-related needs will improve Outcome: Progressing   Problem: Clinical Measurements: Goal: Ability to maintain clinical measurements within normal limits will improve Outcome: Progressing Goal: Will remain free from infection Outcome: Progressing Goal: Diagnostic test results will improve Outcome: Progressing Goal: Respiratory complications will improve Outcome: Progressing Goal: Cardiovascular complication will be avoided Outcome: Progressing   Problem: Activity: Goal: Risk for activity intolerance will decrease Outcome: Progressing   Problem: Nutrition: Goal: Adequate nutrition will be maintained Outcome: Progressing   Problem: Coping: Goal: Level of anxiety will decrease Outcome: Progressing   Problem: Elimination: Goal: Will not experience complications related to bowel motility Outcome: Progressing Goal: Will not experience complications related to urinary retention Outcome: Progressing   Problem: Pain Management: Goal: General experience of comfort will improve Outcome: Progressing   Problem: Safety: Goal: Ability to remain free from injury will improve Outcome: Progressing   Problem: Skin Integrity: Goal: Risk for impaired skin integrity will decrease Outcome: Progressing   Problem: Education: Goal: Ability to demonstrate management of disease process will improve Outcome: Progressing Goal: Individualized Educational Video(s) Outcome: Progressing   Problem: Activity: Goal: Capacity to carry out activities will improve Outcome: Progressing   Problem: Cardiac: Goal: Ability to achieve and maintain adequate cardiopulmonary perfusion will improve Outcome: Progressing

## 2023-05-26 NOTE — Progress Notes (Signed)
HD#2 SUBJECTIVE:  Patient Summary: Phillip Young is a 37 y.o. with a pertinent PMH of ESRD on MWF HD, HFrEF, chronic hypoxic respiratory failure on 5L , Afib on Eliquis who presented with missed dialysis and dyspnea and admitted for MSSA bacteremia.   Overnight Events: Some nausea and he received zofran for relief  Interim History: Pt reports pain in his feet and generalized bone aches. He has occasional nausea with emesis and is taking Zofran. He denies new dyspnea, fevers, chills.  OBJECTIVE:  Vital Signs: Vitals:   05/25/23 2035 05/26/23 0002 05/26/23 0400 05/26/23 0500  BP: (!) 93/51 92/73 100/60   Pulse: 92 78 84 97  Resp: 20 15 20 15   Temp: 99.6 F (37.6 C) 98.5 F (36.9 C) 98.7 F (37.1 C)   TempSrc: Oral Oral Oral   SpO2: 100% 100% 98% 100%  Weight:      Height:       Supplemental O2: Nasal Cannula SpO2: 100 % O2 Flow Rate (L/min): 5 L/min  Filed Weights   05/21/23 1156 05/23/23 0850 05/23/23 1356  Weight: (!) 167.5 kg (!) 166.2 kg (!) 161.3 kg     Intake/Output Summary (Last 24 hours) at 05/26/2023 0652 Last data filed at 05/26/2023 0500 Gross per 24 hour  Intake 1124 ml  Output 5125 ml  Net -4001 ml   Net IO Since Admission: -15,070.37 mL [05/26/23 0652]  Physical Exam: Physical Exam Constitutional:      General: He is not in acute distress.    Appearance: Normal appearance. He is obese. He is not ill-appearing.  HENT:     Head: Normocephalic and atraumatic.  Cardiovascular:     Rate and Rhythm: Normal rate and regular rhythm.     Pulses: Normal pulses.  Pulmonary:     Effort: Pulmonary effort is normal.     Breath sounds: Normal breath sounds.  Chest:     Comments: HD catheter removed from R internal jugular, site is bandaged and clean Abdominal:     General: Abdomen is flat.     Palpations: Abdomen is soft.     Tenderness: There is no abdominal tenderness.  Musculoskeletal:        General: Tenderness present.     Right lower leg: No  edema.     Left lower leg: No edema.     Comments: Bilateral LE TTP, increased from previous  Skin:    General: Skin is warm and dry.     Capillary Refill: Capillary refill takes less than 2 seconds.  Neurological:     General: No focal deficit present.     Mental Status: He is alert and oriented to person, place, and time.  Psychiatric:        Mood and Affect: Mood normal.        Behavior: Behavior normal.     Patient Lines/Drains/Airways Status     Active Line/Drains/Airways     Name Placement date Placement time Site Days   Peripheral IV 05/24/23 20 G 1.75" Anterior;Right Forearm 05/24/23  1958  Forearm  2   Hemodialysis Catheter Right Internal jugular Double lumen Permanent (Tunneled) 03/09/23  0933  Internal jugular  78   Pressure Injury 02/03/23 Coccyx Mid Stage 2 -  Partial thickness loss of dermis presenting as a shallow open injury with a red, pink wound bed without slough. 02/03/23  0800  -- 112   Pressure Injury 05/18/23 Buttocks Right Stage 3 -  Full thickness tissue loss. Subcutaneous fat may be  visible but bone, tendon or muscle are NOT exposed. 05/18/23  2300  -- 8   Pressure Injury 05/18/23 Buttocks Left Stage 2 -  Partial thickness loss of dermis presenting as a shallow open injury with a red, pink wound bed without slough. 05/18/23  2300  -- 8             ASSESSMENT/PLAN:  Assessment: Principal Problem:   Sepsis ([name]) Active Problems:   ESRD (end stage renal disease) on dialysis ([name])   Congestive heart failure ([name])   Acute on chronic hypoxic respiratory failure ([name])   Pressure injury of skin   Phillip Young is a 37 y.o. man with a pertinent PMH of ESRD on MWF HD, HFrEF, chronic hypoxic respiratory failure on 5L [place], Afib on Eliquis who presented with shortness of breath, and admitted for acute on chronic RF and volume overload due to missed dialysis.   MSSA Bacteremia Pt was to be discharged Tuesday before becoming ill with rigors, fever,  tachycardia, hypotension. Started on Vanc/Cefepime now Ancef for MSSA bacteremia. Blood pressures are soft, will certainly consider fluids as necessary to maintain perfusion. There is persistent lactic acidosis, ESRD may reduce clearing, but fortunately mentation remains well. White count resolving, afebrile. Today his primary symptoms are generalized joint pains. HD port replaced today, 72 hr holiday. ID is involved, thank you. - Cefazolin 1g IV 173ml/hr q 24h  - IR has changed HD port and then 72hr line holiday - replacement and HD on Monday - TTE pending   Paroxysmal Afib RVR Chronic Afib on Anticoagulation Pt went into RVR on Tuesday in the setting of his sepsis, stable since. Cardiology involved, thank you - might need higher digoxin if rates become uncontrolled but no aggressive rate controls in setting of sepsis compensation. This patient is refractory to cardioversion x 3 and, and not on amiodarone due to amiodarone toxicity. Beta blockers poor choice due to hypotension. Chronic anticoagulated with Eliquis 5 mg. He is asymptomatic. - Resume Eliquis 5mg  every day. Will hold Monday for HD placement.   Volume overload ESRD on HD MWF Chronic Hypotension Pt stable, weight is down, and attending dialysis without problems. - can do VVS consult only if emergent need for HD  - Line holiday through weekend and then continue HD on MWF.  - Midodrine 30 mg 3 times daily for hypotension - Gabapentin 300 mg 3 times daily after HD for neuropathy - Fosrenol 2000 mg TID with meals   Barriers to dialysis as outpatient: After discharge from previous long hospital/rehab stay, pt was unable attend dialysis multiple times as unable to get to his transportation - he cannot ambulate or safely get himself into a car. He cannot walk up stairs and his apartment is at a ground/basement level requiring him to travel around the back and up a hill which is a significant challenge for him and his helpers.    HFrEF Chronic Respiratory Failure Chronic Hypotension Patient wears 5 L [place] at baseline, and consistently at baseline between 4-5L. Some transient hypoxia at admission long resolved. He does have LE edema that is slowly improving and his weight is going down - Digoxin 0.25 3 times weekly.  - Midodrine 30 mg 3 times daily - Pt not on GDMT as hypotension requiring midodrine is a limiting factor. Will continue to evaluate if there is a role here. - 5L [place] is baseline, remains there   Last echo done in July with EF 20-25%, severely decreased LV function, LVH, and LV  diastolic function unable to be evaluated. He has been evaluated by cardiology, not a candidate for nodal ablation, ICD or pacemaker.  He is yet to follow-up with cardiology outpatient.     Ambulatory Dysfunction Foot Drop R foot Pt with immense difficulty navigating the out-of-hospital environment due to deconditioning from his long prior hospitalization, body habitus, foot drop (R side, originated when he was sedated in ICU this summer), neuropathy. He is unable to get to outpatient dialysis in part due to this, as well as general transportation challenges. Was not able to attend any outpatient dialysis sessions since leaving rehab on 10/18. He feels somewhat more comfortable on his feet now that his swelling is reduced. - PT/OT involved, thank you. Current recs for SNF. Recommend OP evaluation of his foot drop.   Neuropathic pain Dry feet - Oxycodone 15 mg. - gabapentin 300 after dialysis per above - Urea lotion  - Lidocaine lotion    Best Practice: Diet: Renal diet IVF: none VTE: Eliquis Code: Full AB: None Therapy Recs: SNF DISPO: Anticipated discharge in 1-5 days to SNF pending placement.  Signature: [name], D.O.  Internal Medicine Resident, PGY-1 [facility]  Pager: [phone] 6:52 AM, 05/26/2023   Please contact the on call pager after 5 pm and on weekends at  [phone].

## 2023-05-26 NOTE — Progress Notes (Signed)
Discussed with Dr. Anne Fu, patient would be very high risk for TEE. He is morbidly obese, on 4-5L oxygen, on 30mg  TID midodrine, SBP on midodrine is around 95 mmHg. Overall, this would be challenging case and risk would be too high. I have notified primary team.

## 2023-05-26 NOTE — Progress Notes (Signed)
Physical Therapy Treatment Patient Details Name: Phillip Young MRN: [id number] DOB: [birth year] Today's Date: 05/26/2023   History of Present Illness 37 y.o. male presenting with BLE swelling and pain after missing HD appointments for 1 week due to transportation issues. Pt was going to be d/c on 10/29, however, pt w/ a-fib with RVR 2/2 sepsis with MSSA bacteria. Removal of HD catheter 10/31 due to infection with TCD catheter. Last admitted around 01/21/2023 - 03/23/23 with acute hypoxic respiratory failure. PMH PMH: morbid obesity, OSA on home BiPAP, obesity hypoventilation syndrome, chronic systolic heart failure due to nonischemic cardiomyopathy, dialysis dependent ESRD, and paroxysmal atrial fibrillation/flutter    PT Comments  Pt in bed upon arrival and agreeable to PT session. Worked on transfers, LE strength, and balance in today's session. Pt had difficulty standing from elevated bed surface and required multiple attempts. Pt was able to stand twice with ModAx2. Once in standing, pt was CGA for safety and was able to stand for ~1 minute each time. Attempted stand pivot transfer, however, pt became anxious and wanted to return to the bed. The anxiety was linked to pt having difficulty lifting R LE due to R foot drop and fear of not being able to get out of the recliner. Pt is progressing slowly towards goals. Acute PT to follow.      If plan is discharge home, recommend the following: Two people to help with walking and/or transfers;Two people to help with bathing/dressing/bathroom;Help with stairs or ramp for entrance;Assist for transportation;Assistance with cooking/housework   Can travel by private vehicle     No  Equipment Recommendations  Other (comment);Wheelchair (measurements PT);Wheelchair cushion (measurements PT) (power wheelchair to get up hill next to apt where HD Zenaida Niece will pick him up)    Recommendations for Other Services       Precautions / Restrictions  Precautions Precautions: Fall Precaution Comments: R foot drop- no AFO Restrictions Weight Bearing Restrictions: No     Mobility  Bed Mobility Overal bed mobility: Needs Assistance Bed Mobility: Supine to Sit, Sit to Supine     Supine to sit: Min assist, HOB elevated, Used rails Sit to supine: +2 for physical assistance, Mod assist, HOB elevated, Used rails   General bed mobility comments: MinA for sup/sit to assist with trunk elevation. ModAx2 for sit/supine for LE management and to shift hips upon laying down    Transfers Overall transfer level: Needs assistance Equipment used: Rolling walker (2 wheels) Transfers: Sit to/from Stand Sit to Stand: +2 safety/equipment, From elevated surface, Mod assist           General transfer comment: Multiple attempts at standing w/ elevated bed. Pt able to stand x2 with ModAx2 for boost up and initial rise with pad assist. ModAx2 to hold RW steady as pt likes to push from RW. Pt able to stand ~1 min before spontaneously sitting back down. Attempted stand step pivot to recliner on 2nd stand, however, pt became anxious with HR increase to 140 BPM and returned to sitting EOB    Ambulation/Gait  General Gait Details: pt unable to take steps at this time         Balance Overall balance assessment: Needs assistance Sitting-balance support: Feet supported Sitting balance-Leahy Scale: Good Sitting balance - Comments: static sitting EOB unsupported no LOB   Standing balance support: Bilateral upper extremity supported, During functional activity, Reliant on assistive device for balance Standing balance-Leahy Scale: Poor Standing balance comment: heavy reliance on bari RW for dynamic standing tasks  Cognition Arousal: Alert Behavior During Therapy: WFL for tasks assessed/performed Overall Cognitive Status: Within Functional Limits for tasks assessed        General Comments: needed encouragement to participate in session, limited  by anxiety upon standing           General Comments General comments (skin integrity, edema, etc.): VSS on 5L, HR increase to 140 BPM when attempting stand step transfer      Pertinent Vitals/Pain Pain Assessment Pain Assessment: Faces Faces Pain Scale: Hurts little more Pain Location: generalized Pain Descriptors / Indicators: Grimacing, Burning, Discomfort, Numbness Pain Intervention(s): Limited activity within patient's tolerance, Monitored during session, Repositioned     PT Goals (current goals can now be found in the care plan section) Acute Rehab PT Goals PT Goal Formulation: With patient Time For Goal Achievement: 06/09/23 Potential to Achieve Goals: Good Progress towards PT goals: Progressing toward goals    Frequency    Min 1X/week       AM-PAC PT "6 Clicks" Mobility   Outcome Measure  Help needed turning from your back to your side while in a flat bed without using bedrails?: None Help needed moving from lying on your back to sitting on the side of a flat bed without using bedrails?: A Little Help needed moving to and from a bed to a chair (including a wheelchair)?: Total Help needed standing up from a chair using your arms (e.g., wheelchair or bedside chair)?: Total Help needed to walk in hospital room?: Total Help needed climbing 3-5 steps with a railing? : Total 6 Click Score: 11    End of Session Equipment Utilized During Treatment: Gait belt;Oxygen Activity Tolerance: Patient limited by pain (and anxiety) Patient left: in bed;with call bell/phone within reach Nurse Communication: Mobility status PT Visit Diagnosis: Muscle weakness (generalized) (M62.81);Other abnormalities of gait and mobility (R26.89);Difficulty in walking, not elsewhere classified (R26.2)     Time: 1610-9604 PT Time Calculation (min) (ACUTE ONLY): 42 min  Charges:    $Therapeutic Exercise: 8-22 mins $Therapeutic Activity: 23-37 mins PT General Charges $$ ACUTE PT VISIT:  1 Visit                     Hilton Cork, PT, DPT Secure Chat Preferred  Rehab Office [phone]   Arturo Morton Brion Aliment 05/26/2023, 1:12 PM

## 2023-05-26 NOTE — Procedures (Signed)
PROCEDURE SUMMARY:  Successful removal of tunneled hemodialysis catheter.  Patient tolerated well.  EBL < 5 mL  See full dictation in Imaging for details.  Quinterius Gaida S Vernette Moise PA-C 05/26/2023 9:31 AM

## 2023-05-26 NOTE — Progress Notes (Signed)
Patient called out stating he was in pain multiple times and each time he is requesting IV Dilaudid. I notified that patient that he should try the pain medication that he has been given, which is the same he takes at home. He refused to take anything. Once again, closer to the end of the shift the patient has tried to explain again why he should have this IV pain medication again and I notified him that due to the fact of his low blood pressure and he is not taking the oral medication that he normally takes. Finally patient agrees to be premedicated and given IV zofran before receiving his nightly dose of medications. Will continue to monitor patient.

## 2023-05-26 NOTE — Progress Notes (Signed)
Patient has refused his CHG bath this morning. He has received education. I notified MD to speak with the patient about this matter due to his recent infection with his TCD catheter and being removed 05/26/23.  Notified Yossefzadeh MD as he went into the room. Will continue to monitor patient

## 2023-05-26 NOTE — Progress Notes (Signed)
*  PRELIMINARY RESULTS* Echocardiogram 2D Echocardiogram has been performed.  Phillip Young Phillip Young 05/26/2023, 2:14 PM

## 2023-05-26 NOTE — Progress Notes (Addendum)
[facility] Progress Note   Subjective:    Seen and examined patient at bedside. Tolerated HD yesterday with net UF 5L. TDC removed this morning by IR and now on a line holiday. Plan for new [facility] placement on Monday (05/30/23) morning.  Objective Vitals:   05/26/23 0002 05/26/23 0400 05/26/23 0500 05/26/23 0935  BP: 92/73 100/60  95/80  Pulse: 78 84 97 94  Resp: 15 20 15    Temp: 98.5 F (36.9 C) 98.7 F (37.1 C)    TempSrc: Oral Oral    SpO2: 100% 98% 100%   Weight:      Height:       Physical Exam General: Chronically ill-appearing, Obese. NAD. Nasal O2 in place (5L is baseline) Heart: Irregular, no murmurs, gallops, or rubs Lungs: Clear anteriorly Abdomen: soft, non-tender Extremities: No LE edema Dialysis Access: TDC in R chest  Filed Weights   05/21/23 1156 05/23/23 0850 05/23/23 1356  Weight: (!) 167.5 kg (!) 166.2 kg (!) 161.3 kg    Intake/Output Summary (Last 24 hours) at 05/26/2023 1145 Last data filed at 05/26/2023 0500 Gross per 24 hour  Intake 550 ml  Output 5125 ml  Net -4575 ml    Additional Objective Labs: Basic Metabolic Panel: Recent Labs  Lab 05/21/23 0710 05/22/23 0501 05/24/23 1914 05/25/23 0446 05/26/23 0633  NA 135   < > 135 134* 130*  K 5.5*   < > 4.5 4.4 4.1  CL 97*   < > 98 98 93*  CO2 26   < > 24 21* 23  GLUCOSE 73   < > 119* 120* 101*  BUN 38*   < > 17 20 20   CREATININE 10.65*   < > 7.74* 8.19* 6.25*  CALCIUM 10.2   < > 10.4* 10.0 9.7  PHOS 6.7*  --  3.1 3.5  --    < > = values in this interval not displayed.   Liver Function Tests: Recent Labs  Lab 05/20/23 1422 05/21/23 0710 05/24/23 1914  ALBUMIN 2.5* 2.6* 3.0*   No results for input(s): "LIPASE", "AMYLASE" in the last 168 hours. CBC: Recent Labs  Lab 05/22/23 0501 05/23/23 0446 05/24/23 1914 05/25/23 0446 05/26/23 0633  WBC 6.1 5.4 10.6* 19.3* 9.3  NEUTROABS  --   --  9.7* 18.2*  --   HGB 8.3* 10.2* 9.6* 8.6* 9.1*  HCT 26.6* 34.3* 31.3*  27.7* 29.2*  MCV 101.1* 101.5* 100.6* 100.0 100.7*  PLT 248 290 240 200 182   Blood Culture    Component Value Date/Time   SDES BLOOD RIGHT HAND 05/24/2023 1920   SPECREQUEST  05/24/2023 1920    BOTTLES DRAWN AEROBIC ONLY Blood Culture adequate volume   CULT STAPHYLOCOCCUS AUREUS (A) 05/24/2023 1920   REPTSTATUS PENDING 05/24/2023 1920    Cardiac Enzymes: No results for input(s): "CKTOTAL", "CKMB", "CKMBINDEX", "TROPONINI" in the last 168 hours. CBG: No results for input(s): "GLUCAP" in the last 168 hours. Iron Studies: No results for input(s): "IRON", "TIBC", "TRANSFERRIN", "FERRITIN" in the last 72 hours. Lab Results  Component Value Date   INR 2.0 (H) 06/10/2022   INR 2.8 (H) 06/09/2022   INR 2.9 (H) 06/07/2022   Studies/Results: IR Removal Tun Cv Cath W/O FL  Result Date: 05/26/2023 INDICATION: Hemodialysis. Now with diagnostic sepsis. Request for removal of tunneled hemodialysis catheter. It was initially placed March 09, 2023 by Dr. Juliette Alcide. EXAM: REMOVAL OF TUNNELED HEMODIALYSIS CATHETER MEDICATIONS: 1% lidocaine 8 mL COMPLICATIONS: None immediate. PROCEDURE: Informed written consent  was obtained from the patient following an explanation of the procedure, risks, benefits and alternatives to treatment. A time out was performed prior to the initiation of the procedure. Maximal barrier sterile technique was utilized including caps, mask, sterile gowns, sterile gloves, large sterile drape, and hand hygiene. ChloraPrep was used to prep the patient's right neck, chest and existing catheter. 1% lidocaine was injected around the catheter and the subcutaneous tunnel. The catheter was dissected out using scissors and curved hemostats until the cuff was freed from the surrounding fibrous sheath. The catheter was removed intact. Hemostasis was obtained with manual compression. A dressing was placed. The patient tolerated the procedure well without immediate post procedural complication.  IMPRESSION: Successful removal of tunneled dialysis catheter. Procedure performed by: Corrin Parker, PA-C Electronically Signed   By: Irish Lack M.D.   On: 05/26/2023 09:42   DG CHEST PORT 1 VIEW  Result Date: 05/24/2023 CLINICAL DATA:  [id number] Dyspnea [id number] EXAM: PORTABLE CHEST 1 VIEW COMPARISON:  Chest x-ray 05/18/2023 FINDINGS: Right chest wall dialysis catheter with tip overlying the right atrium. Enlarged cardiac silhouette. The heart and mediastinal contours are unchanged. No focal consolidation. No pulmonary edema. No pleural effusion. No pneumothorax. No acute osseous abnormality. IMPRESSION: 1. Persistent cardiomegaly with underlying pericardial effusion not excluded. 2. No acute cardiopulmonary abnormality. Electronically Signed   By: Tish Frederickson M.D.   On: 05/24/2023 22:36    Medications:   ceFAZolin (ANCEF) IV Stopped (05/25/23 1906)   lactated ringers Stopped (05/25/23 0855)    apixaban  5 mg Oral BID   Chlorhexidine Gluconate Cloth  6 each Topical Q0600   darbepoetin (ARANESP) injection - DIALYSIS  100 mcg Subcutaneous Q Sat-1800   digoxin  0.125 mg Oral Once per day on Monday Wednesday Friday   feeding supplement (NEPRO CARB STEADY)  237 mL Oral BID BM   gabapentin  300 mg Oral Q M,W,F-HD   Gerhardt's butt cream   Topical TID   lanthanum  2,000 mg Oral TID WC   leptospermum manuka honey  1 Application Topical Daily   levothyroxine  25 mcg Oral Q0600   lidocaine  10 mL Infiltration Once   midodrine  30 mg Oral TID WC   urea   Topical Daily    Dialysis Orders: MWF at [facility] 4.5hr, 400/800, EDW 172kg, 2K/2Ca bath, TDC, no heparin (+ HIT)  Assessment/Plan: 1. Acute Hypoxic RF: In setting of missed HD due to transportation issues. Improving. 2. MSSA Bacteremia: on IV Vanc and Cefepime. IR consulted. TDC removed this morning by IR and now on line holiday. Plan for new [facility] placement on 05/30/23 in the AM-order already placed. Watch labs and volume status closely. Will need  to consult [facility] over the weekend ONLY if he needs emergent dialysis. 3. Paroxysmal A-fib RVR: Cardiology on board. On Eliquis and Digoxin. Not a candidate for Amiodarone 2nd toxicity and elevated LFTs. Not a candidate for ablation or pacemaker 2nd infection risk. 4. ESRD: Back on MWF - next HD 05/30/23 after new TDC placement. Maximizing UF as tolerated. + HIT - no heparin. Renal US showed no hydronephrosis and increasing echogenicity consistent with hepatic steatosis. See #2 5. HypoTN/volume: BP stable on high dose midodrine (30mg  TID), now under EDW, will need EDW lowered. 6. Anemia: Hgb 9.1 - will raise Aranesp q Sat while here. 7. Secondary hyperparathyroidism: CorrCa high, Phos okay - continue lanthanum binders. No VDRA. Check PTH. 8. Nutrition: Alb low, continue supps. 9. HFrEF: On digoxin + Eliquis. 10.  Obesity  Salome Holmes, NP [facility] 05/26/2023,11:45 AM  LOS: 2 days

## 2023-05-27 ENCOUNTER — Encounter (HOSPITAL_COMMUNITY)
Admission: EM | Disposition: A | Discharge status: Rehabilitation Facility | Payer: Self-pay | Source: Home / Self Care | Attending: Internal Medicine

## 2023-05-27 DIAGNOSIS — A4101 Sepsis due to Methicillin susceptible Staphylococcus aureus: Secondary | ICD-10-CM | POA: Diagnosis not present

## 2023-05-27 DIAGNOSIS — Z992 Dependence on renal dialysis: Secondary | ICD-10-CM | POA: Diagnosis not present

## 2023-05-27 DIAGNOSIS — I480 Paroxysmal atrial fibrillation: Secondary | ICD-10-CM | POA: Diagnosis not present

## 2023-05-27 DIAGNOSIS — B9561 Methicillin susceptible Staphylococcus aureus infection as the cause of diseases classified elsewhere: Secondary | ICD-10-CM | POA: Diagnosis not present

## 2023-05-27 LAB — RENAL FUNCTION PANEL
Albumin: 2.5 g/dL — ABNORMAL LOW (ref 3.5–5.0)
Anion gap: 11 (ref 5–15)
BUN: 31 mg/dL — ABNORMAL HIGH (ref 6–20)
CO2: 25 mmol/L (ref 22–32)
Calcium: 9.4 mg/dL (ref 8.9–10.3)
Chloride: 96 mmol/L — ABNORMAL LOW (ref 98–111)
Creatinine, Ser: 7.64 mg/dL — ABNORMAL HIGH (ref 0.61–1.24)
GFR, Estimated: 9 mL/min — ABNORMAL LOW (ref >60)
Glucose, Bld: 83 mg/dL (ref 70–99)
Phosphorus: 4.5 mg/dL (ref 2.5–4.6)
Potassium: 4.1 mmol/L (ref 3.5–5.1)
Sodium: 132 mmol/L — ABNORMAL LOW (ref 135–145)

## 2023-05-27 LAB — CBC WITH DIFFERENTIAL/PLATELET
Abs Immature Granulocytes: 0.02 10*3/uL (ref 0.00–0.07)
Basophils Absolute: 0.1 10*3/uL (ref 0.0–0.1)
Basophils Relative: 1 %
Eosinophils Absolute: 0.4 10*3/uL (ref 0.0–0.5)
Eosinophils Relative: 6 %
HCT: 28.9 % — ABNORMAL LOW (ref 39.0–52.0)
Hemoglobin: 9.2 g/dL — ABNORMAL LOW (ref 13.0–17.0)
Immature Granulocytes: 0 %
Lymphocytes Relative: 17 %
Lymphs Abs: 1.1 10*3/uL (ref 0.7–4.0)
MCH: 31.3 pg (ref 26.0–34.0)
MCHC: 31.8 g/dL (ref 30.0–36.0)
MCV: 98.3 fL (ref 80.0–100.0)
Monocytes Absolute: 1.1 10*3/uL — ABNORMAL HIGH (ref 0.1–1.0)
Monocytes Relative: 16 %
Neutro Abs: 3.9 10*3/uL (ref 1.7–7.7)
Neutrophils Relative %: 60 %
Platelets: 172 10*3/uL (ref 150–400)
RBC: 2.94 MIL/uL — ABNORMAL LOW (ref 4.22–5.81)
RDW: 15.5 % (ref 11.5–15.5)
WBC: 6.5 10*3/uL (ref 4.0–10.5)
nRBC: 0 % (ref 0.0–0.2)

## 2023-05-27 LAB — CULTURE, BLOOD (ROUTINE X 2)
Special Requests: ADEQUATE
Special Requests: ADEQUATE

## 2023-05-27 SURGERY — TRANSESOPHAGEAL ECHOCARDIOGRAM (TEE) (CATHLAB)
Anesthesia: Monitor Anesthesia Care

## 2023-05-27 MED ORDER — CEFAZOLIN SODIUM-DEXTROSE 2-4 GM/100ML-% IV SOLN: 2.0000 g | INTRAVENOUS | Status: DC

## 2023-05-27 NOTE — Consult Note (Signed)
Chief Complaint: Patient was seen in consultation today for tunneled dialysis catheter placement Chief Complaint  Patient presents with   Leg Swelling   Leg Pain   at the request of Dr Marisue Humble  Supervising Physician: Gilmer Mor  Patient Status: [facility] - In-pt  History of Present Illness: Phillip Young is a 37 y.o. male   FULL Code status per pt  ESRD HD catheter removal in IR 10/31--- diagnostic sepsis Placed in IR 03/09/23  Line Holiday per Nephrology  Planned for new placement in IR 11/4    Past Medical History:  Diagnosis Date   Acute on chronic respiratory failure with hypoxia ([name]) 04/21/2021   Acute on chronic systolic (congestive) heart failure ([name]) 02/26/2020   Amiodarone toxicity    Anemia    Atrial flutter ([name])    Biventricular congestive heart failure ([name])    Chronic hypoxemic respiratory failure ([name])    Class 3 severe obesity due to excess calories with serious comorbidity and body mass index (BMI) of 50.0 to 59.9 in adult ([name]) 02/26/2020   Essential hypertension 02/26/2020   GERD without esophagitis 02/26/2020   Hidradenitis suppurativa 02/26/2020   NICM (nonischemic cardiomyopathy) ([name])    Obesity hypoventilation syndrome ([name])    OSA (obstructive sleep apnea)    PAF (paroxysmal atrial fibrillation) ([name])    Pneumonia    Prediabetes 02/26/2020    Past Surgical History:  Procedure Laterality Date   ABSCESS DRAINAGE     AV FISTULA PLACEMENT Left 08/21/2021   Procedure: LEFT ARM ARTERIOVENOUS (AV) FISTULA.;  Surgeon: Nada Libman, MD;  Location: [facility] OR;  Service: Vascular;  Laterality: Left;   CARDIAC CATHETERIZATION     CARDIOVERSION N/A 10/09/2021   Procedure: CARDIOVERSION;  Surgeon: Laurey Morale, MD;  Location: [facility] ENDOSCOPY;  Service: Cardiovascular;  Laterality: N/A;   CARDIOVERSION N/A 05/28/2022   Procedure: CARDIOVERSION;  Surgeon: Laurey Morale, MD;  Location: [facility] ENDOSCOPY;  Service: Cardiovascular;  Laterality: N/A;    CARDIOVERSION N/A 06/07/2022   Procedure: CARDIOVERSION;  Surgeon: Laurey Morale, MD;  Location: [facility] ENDOSCOPY;  Service: Cardiovascular;  Laterality: N/A;   IR FLUORO GUIDE CV LINE RIGHT  03/10/2021   IR FLUORO GUIDE CV LINE RIGHT  04/22/2021   IR FLUORO GUIDE CV LINE RIGHT  08/20/2021   IR FLUORO GUIDE CV LINE RIGHT  03/01/2023   IR FLUORO GUIDE CV LINE RIGHT  03/09/2023   IR PTA VENOUS EXCEPT DIALYSIS CIRCUIT  03/09/2023   IR REMOVAL TUN CV CATH W/O FL  05/26/2023   IR REMOVE CV FIBRIN SHEATH  03/09/2023   IR US GUIDE VASC ACCESS RIGHT  03/10/2021   IR US GUIDE VASC ACCESS RIGHT  04/22/2021   RIGHT HEART CATH N/A 03/06/2021   Procedure: RIGHT HEART CATH;  Surgeon: Dolores Patty, MD;  Location: [facility] INVASIVE CV LAB;  Service: Cardiovascular;  Laterality: N/A;   RIGHT/LEFT HEART CATH AND CORONARY ANGIOGRAPHY N/A 03/04/2020   Procedure: RIGHT/LEFT HEART CATH AND CORONARY ANGIOGRAPHY;  Surgeon: Laurey Morale, MD;  Location: [facility] INVASIVE CV LAB;  Service: Cardiovascular;  Laterality: N/A;   TEE WITHOUT CARDIOVERSION N/A 05/05/2021   Procedure: TRANSESOPHAGEAL ECHOCARDIOGRAM (TEE);  Surgeon: Laurey Morale, MD;  Location: [facility] ENDOSCOPY;  Service: Cardiovascular;  Laterality: N/A;   TEMPORARY DIALYSIS CATHETER  03/06/2021   Procedure: TEMPORARY DIALYSIS CATHETER;  Surgeon: Dolores Patty, MD;  Location: [facility] INVASIVE CV LAB;  Service: Cardiovascular;;    Allergies: Amiodarone, Coreg [carvedilol], Heparin, Metoprolol, Amoxicillin, and Other  Medications: Prior to Admission medications   Medication Sig Start Date End Date Taking? Authorizing Provider  apixaban (ELIQUIS) 2.5 MG TABS tablet Take 2.5 mg by mouth 2 (two) times daily.   Yes [provider]  Darbepoetin Alfa (ARANESP) 200 MCG/0.4ML SOSY injection Inject 0.4 mLs (200 mcg total) into the skin every Saturday at 6 PM. 03/26/23  Yes Juberg, Cristal Deer, DO  digoxin (LANOXIN) 0.125 MG tablet Take 1 tablet (0.125 mg  total) by mouth 3 (three) times a week. 03/23/23  Yes Juberg, Cristal Deer, DO  gabapentin (NEURONTIN) 300 MG capsule Take 300 mg by mouth daily. Mon., Wed, and Fri.   Yes [provider]  lanthanum (FOSRENOL) 1000 MG chewable tablet Chew 2,000 mg by mouth 3 (three) times daily with meals.   Yes [provider]  melatonin 5 MG TABS Take 1 tablet (5 mg total) by mouth at bedtime. 06/10/22  Yes Crissie Sickles, MD  midodrine (PROAMATINE) 10 MG tablet Take 3 tablets (30 mg total) by mouth 3 (three) times daily with meals. 03/23/23  Yes Juberg, Cristal Deer, DO  Mouthwashes (MOUTH RINSE) LIQD solution 15 mLs by Mouth Rinse route in the morning, at noon, in the evening, and at bedtime. 03/23/23  Yes [name], DO  albumin human 25 % bottle Inject 100 mLs (25 g total) into the vein Every Tuesday,Thursday,and Saturday with dialysis. Patient not taking: Reported on 05/18/2023 03/24/23   [name], DO  anticoagulant sodium citrate 4 GM/100ML SOLN 5 mLs by Intracatheter route once for 1 dose. 03/23/23 03/23/23  [name], DO  haloperidol lactate (HALDOL) 5 MG/ML injection Inject 0.2 mLs (1 mg total) into the vein every 12 (twelve) hours as needed. Patient not taking: Reported on 05/18/2023 03/23/23   [name], DO  hydrocortisone cream 1 % Apply 1 Application topically 3 (three) times daily as needed for itching (minor skin irritation). Patient not taking: Reported on 05/18/2023 03/23/23   [name], DO  leptospermum manuka honey (MEDIHONEY) PSTE paste Apply 1 Application topically daily. 05/25/23   [name], DO  levothyroxine (SYNTHROID) 25 MCG tablet Take 1 tablet (25 mcg total) by mouth daily at 6 (six) AM. 05/25/23   Juberg, Cristal Deer, DO  Nystatin (GERHARDT'S BUTT CREAM) CREA Apply 1 Application topically 3 (three) times daily. 05/24/23   [name], DO  oxyCODONE (ROXICODONE) 15 MG immediate release tablet Take 1 tablet (15 mg  total) by mouth every 4 (four) hours as needed for up to 3 days. 05/24/23 05/27/23  [name], DO     Family History  Problem Relation Age of Onset   Heart disease Mother    Hypertension Mother    Pulmonary Hypertension Mother    Drug abuse Father        died due to Heroin overdose    Social History   Socioeconomic History   Marital status: Single    Spouse name: Not on file   Number of children: Not on file   Years of education: Not on file   Highest education level: Not on file  Occupational History   Not on file  Tobacco Use   Smoking status: Former    Current packs/day: 0.00    Types: Cigarettes    Quit date: 2019    Years since quitting: 5.8    Passive exposure: Current   Smokeless tobacco: Former   Tobacco comments:    quit in 2019  Vaping Use   Vaping status: Never Used  Substance and Sexual Activity  Alcohol use: Never   Drug use: Never   Sexual activity: Not on file  Other Topics Concern   Not on file  Social History Narrative   Not on file   Social Determinants of Health   Financial Resource Strain: Low Risk  (03/24/2023)   Received from [facility]   Overall Financial Resource Strain (CARDIA)    Difficulty of Paying Living Expenses: Not very hard  Food Insecurity: No Food Insecurity (05/18/2023)   Hunger Vital Sign    Worried About Running Out of Food in the Last Year: Never true    Ran Out of Food in the Last Year: Never true  Transportation Needs: No Transportation Needs (05/18/2023)   PRAPARE - Administrator, Civil Service (Medical): No    Lack of Transportation (Non-Medical): No  Physical Activity: Inactive (09/21/2022)   Exercise Vital Sign    Days of Exercise per Week: 0 days    Minutes of Exercise per Session: 0 min  Stress: No Stress Concern Present (04/21/2023)   Received from [facility]   [employer] of Occupational Health - Occupational Stress Questionnaire    Feeling of Stress : Only a little   Social Connections: Moderately Isolated (03/24/2023)   Received from [facility]   Social Connection and Isolation Panel [NHANES]    Frequency of Communication with Friends and Family: More than three times a week    Frequency of Social Gatherings with Friends and Family: More than three times a week    Attends Religious Services: 1 to 4 times per year    Active Member of [employer] or Organizations: No    Attends Banker Meetings: Never    Marital Status: Never married    Review of Systems: A 12 point ROS discussed and pertinent positives are indicated in the HPI above.  All other systems are negative.  Vital Signs: BP 108/79 (BP Location: Right Wrist)   Pulse 82   Temp 98.8 F (37.1 C) (Oral)   Resp 20   Ht 6' (1.829 m)   Wt (!) 355 lb 9.6 oz (161.3 kg) Comment: bed  SpO2 95%   BMI 48.23 kg/m     Physical Exam Vitals reviewed.  HENT:     Mouth/Throat:     Mouth: Mucous membranes are moist.  Cardiovascular:     Rate and Rhythm: Normal rate and regular rhythm.     Heart sounds: Normal heart sounds.  Pulmonary:     Effort: Pulmonary effort is normal.     Breath sounds: Normal breath sounds. No wheezing.  Musculoskeletal:        General: Normal range of motion.  Skin:    General: Skin is warm.  Neurological:     Mental Status: He is alert and oriented to person, place, and time.  Psychiatric:        Behavior: Behavior normal.     Imaging: ECHOCARDIOGRAM COMPLETE  Result Date: 05/26/2023    ECHOCARDIOGRAM REPORT   Patient Name:   Phillip Young Date of Exam: 05/26/2023 Medical Rec #:  [id number]      Height:       72.0 in Accession #:    [id number]     Weight:       355.6 lb Date of Birth:  August 08, 1985       BSA:          2.721 m Patient Age:    37 years       BP:  110/70 mmHg Patient Gender: M              HR:           81 bpm. Exam Location:  Inpatient Procedure: 2D Echo, Cardiac Doppler and Color Doppler Indications:    A-fib I48.91  History:         Patient has prior history of Echocardiogram examinations, most                 recent 01/25/2023. CHF, COPD and ESRD, Arrythmias:Atrial                 Fibrillation; Risk Factors:Diabetes and Sleep Apnea.  Sonographer:    Dondra Prader RVT RCS Referring Phys: [id number] EMILY B MULLEN  Sonographer Comments: Technically challenging study due to limited acoustic windows, Technically difficult study due to poor echo windows, suboptimal parasternal window, suboptimal apical window, suboptimal subcostal window and patient is obese. Image acquisition challenging due to patient body habitus. Definity was deemed inappropriate due to very poor windows and foreshortened images. IMPRESSIONS  1. Left ventricular ejection fraction, by estimation, is 30 to 35%. The left ventricle has moderately decreased function. The left ventricle demonstrates global hypokinesis. The left ventricular internal cavity size was severely dilated. Left ventricular diastolic parameters are indeterminate.  2. Right ventricular systolic function is mildly reduced. The right ventricular size is not well visualized. Tricuspid regurgitation signal is inadequate for assessing PA pressure.  3. The mitral valve is normal in structure. Mild mitral valve regurgitation.  4. The aortic valve is tricuspid. Aortic valve regurgitation is not visualized.  5. The inferior vena cava is dilated in size with >50% respiratory variability, suggesting right atrial pressure of 8 mmHg. Comparison(s): No significant change from prior study. 01/2023-EF 20-25%. FINDINGS  Left Ventricle: Left ventricular ejection fraction, by estimation, is 30 to 35%. The left ventricle has moderately decreased function. The left ventricle demonstrates global hypokinesis. The left ventricular internal cavity size was severely dilated. There is no left ventricular hypertrophy. Left ventricular diastolic parameters are indeterminate. Right Ventricle: The right ventricular size is not well visualized.  Right ventricular systolic function is mildly reduced. Tricuspid regurgitation signal is inadequate for assessing PA pressure. Left Atrium: Left atrial size was normal in size. Right Atrium: Right atrial size was normal in size. Pericardium: There is no evidence of pericardial effusion. Mitral Valve: The mitral valve is normal in structure. Mild mitral valve regurgitation. Tricuspid Valve: The tricuspid valve is normal in structure. Tricuspid valve regurgitation is not demonstrated. Aortic Valve: The aortic valve is tricuspid. Aortic valve regurgitation is not visualized. Aortic valve mean gradient measures 3.0 mmHg. Aortic valve peak gradient measures 5.3 mmHg. Aortic valve area, by VTI measures 2.75 cm. Pulmonic Valve: Pulmonic valve regurgitation is not visualized. Aorta: The aortic root and ascending aorta are structurally normal, with no evidence of dilitation. Venous: The inferior vena cava is dilated in size with greater than 50% respiratory variability, suggesting right atrial pressure of 8 mmHg. IAS/Shunts: The interatrial septum was not well visualized.  LEFT VENTRICLE PLAX 2D LVIDd:         7.05 cm LVIDs:         5.90 cm LV PW:         1.35 cm LV IVS:        0.85 cm LVOT diam:     2.20 cm LV SV:         56 LV SV Index:   20 LVOT Area:  3.80 cm  RIGHT VENTRICLE            IVC RV S prime:     9.32 cm/s  IVC diam: 2.60 cm TAPSE (M-mode): 1.7 cm LEFT ATRIUM             Index        RIGHT ATRIUM           Index LA Vol (A2C):   68.2 ml 25.07 ml/m  RA Area:     16.50 cm LA Vol (A4C):   75.6 ml 27.78 ml/m  RA Volume:   41.20 ml  15.14 ml/m LA Biplane Vol: 71.1 ml 26.13 ml/m  AORTIC VALVE                    PULMONIC VALVE AV Area (Vmax):    3.18 cm     PV Vmax:       1.01 m/s AV Area (Vmean):   2.97 cm     PV Peak grad:  4.1 mmHg AV Area (VTI):     2.75 cm AV Vmax:           114.63 cm/s AV Vmean:          75.967 cm/s AV VTI:            0.203 m AV Peak Grad:      5.3 mmHg AV Mean Grad:      3.0 mmHg  LVOT Vmax:         95.77 cm/s LVOT Vmean:        59.367 cm/s LVOT VTI:          0.147 m LVOT/AV VTI ratio: 0.72  AORTA Ao Root diam: 3.10 cm Ao Asc diam:  2.70 cm MITRAL VALVE MV Area (PHT): 7.16 cm    SHUNTS MV Decel Time: 106 msec    Systemic VTI:  0.15 m MV E velocity: 97.80 cm/s  Systemic Diam: 2.20 cm MV A velocity: 22.10 cm/s MV E/A ratio:  4.43 [place] signed by Carolan Clines Signature Date/Time: 05/26/2023/2:52:10 PM    Final    IR Removal Tun Cv Cath W/O FL  Result Date: 05/26/2023 INDICATION: Hemodialysis. Now with diagnostic sepsis. Request for removal of tunneled hemodialysis catheter. It was initially placed March 09, 2023 by Dr. Juliette Alcide. EXAM: REMOVAL OF TUNNELED HEMODIALYSIS CATHETER MEDICATIONS: 1% lidocaine 8 mL COMPLICATIONS: None immediate. PROCEDURE: Informed written consent was obtained from the patient following an explanation of the procedure, risks, benefits and alternatives to treatment. A time out was performed prior to the initiation of the procedure. Maximal barrier sterile technique was utilized including caps, mask, sterile gowns, sterile gloves, large sterile drape, and hand hygiene. ChloraPrep was used to prep the patient's right neck, chest and existing catheter. 1% lidocaine was injected around the catheter and the subcutaneous tunnel. The catheter was dissected out using scissors and curved hemostats until the cuff was freed from the surrounding fibrous sheath. The catheter was removed intact. Hemostasis was obtained with manual compression. A dressing was placed. The patient tolerated the procedure well without immediate post procedural complication. IMPRESSION: Successful removal of tunneled dialysis catheter. Procedure performed by: Corrin Parker, PA-C Electronically Signed   By: Irish Lack M.D.   On: 05/26/2023 09:42   DG CHEST PORT 1 VIEW  Result Date: 05/24/2023 CLINICAL DATA:  [id number] Dyspnea [id number] EXAM: PORTABLE CHEST 1 VIEW COMPARISON:  Chest  x-ray 05/18/2023 FINDINGS: Right chest wall dialysis catheter with tip overlying the right  atrium. Enlarged cardiac silhouette. The heart and mediastinal contours are unchanged. No focal consolidation. No pulmonary edema. No pleural effusion. No pneumothorax. No acute osseous abnormality. IMPRESSION: 1. Persistent cardiomegaly with underlying pericardial effusion not excluded. 2. No acute cardiopulmonary abnormality. Electronically Signed   By: Tish Frederickson M.D.   On: 05/24/2023 22:36   US RENAL  Result Date: 05/22/2023 CLINICAL DATA:  [id number] Microscopic hematuria [id number] EXAM: RENAL / URINARY TRACT ULTRASOUND COMPLETE COMPARISON:  January 23, 2023. FINDINGS: Right Kidney: Renal measurements: 10.3 x 4.5 x 5.8 cm = volume: 140 mL. Echogenicity within normal limits. No mass or hydronephrosis visualized. Left Kidney: Renal measurements: 11.5 x 5.9 x 3.9 cm = volume: 139 mL. Echogenicity within normal limits. No mass or hydronephrosis visualized. Bladder: Bladder is completely decompressed. Other: Increased hepatic echogenicity. IMPRESSION: 1. No hydronephrosis. 2. Increased hepatic echogenicity as can be seen in hepatic steatosis. Electronically Signed   By: Meda Klinefelter M.D.   On: 05/22/2023 12:08   DG Chest Portable 1 View  Result Date: 05/18/2023 CLINICAL DATA:  Hypoxia.  Shortness of breath. EXAM: PORTABLE CHEST 1 VIEW COMPARISON:  March 08, 2023. FINDINGS: Stable cardiomegaly with central pulmonary vascular congestion. Right internal jugular dialysis catheter is noted. Mild bilateral pulmonary edema may be present. Bony thorax is unremarkable. IMPRESSION: Stable cardiomegaly with central pulmonary vascular congestion and possible mild bilateral pulmonary edema. Electronically Signed   By: Lupita Raider M.D.   On: 05/18/2023 18:53    Labs:  CBC: Recent Labs    05/24/23 1914 05/25/23 0446 05/26/23 0633 05/27/23 0636  WBC 10.6* 19.3* 9.3 6.5  HGB 9.6* 8.6* 9.1* 9.2*  HCT 31.3* 27.7*  29.2* 28.9*  PLT 240 200 182 172    COAGS: Recent Labs    06/07/22 1655 06/09/22 0512 06/10/22 0452  INR 2.9* 2.8* 2.0*    BMP: Recent Labs    05/24/23 1914 05/25/23 0446 05/26/23 0633 05/27/23 0636  NA 135 134* 130* 132*  K 4.5 4.4 4.1 4.1  CL 98 98 93* 96*  CO2 24 21* 23 25  GLUCOSE 119* 120* 101* 83  BUN 17 20 20  31*  CALCIUM 10.4* 10.0 9.7 9.4  CREATININE 7.74* 8.19* 6.25* 7.64*  GFRNONAA 8* 8* 11* 9*    LIVER FUNCTION TESTS: Recent Labs    03/14/23 0548 03/15/23 0640 03/24/23 0419 03/25/23 0932 03/28/23 0306 05/18/23 1729 05/19/23 0451 05/20/23 1422 05/21/23 0710 05/24/23 1914 05/27/23 0636  BILITOT 0.6  --  0.7 0.3  --  0.7  --   --   --   --   --   AST 33  --  22 17  --  41  --   --   --   --   --   ALT 14  --  11 9  --  26  --   --   --   --   --   ALKPHOS 332*  --  205* 187*  --  149*  --   --   --   --   --   PROT 6.8  --  6.9 6.5  --  6.7  --   --   --   --   --   ALBUMIN 2.7*   < > 3.1* 2.8*   < > 2.8*   < > 2.5* 2.6* 3.0* 2.5*   < > = values in this interval not displayed.    TUMOR MARKERS: No results for input(s): "AFPTM", "CEA", "CA199", "CHROMGRNA"  in the last 8760 hours.  Assessment and Plan:  Scheduled for tunneled dialysis catheter placement in IR 11/4 We will call for pt when can Risks and benefits discussed with the patient including, but not limited to bleeding, infection, vascular injury, pneumothorax which may require chest tube placement, air embolism or even death  All of the patient's questions were answered, patient is agreeable to proceed. Consent signed and in chart.  Thank you for this interesting consult.  I greatly enjoyed meeting Phillip Young and look forward to participating in their care.  A copy of this report was sent to the requesting provider on this date.  Electronically Signed: Robet Leu, PA-C 05/27/2023, 11:49 AM   I spent a total of 20 Minutes    in face to face in clinical consultation,  greater than 50% of which was counseling/coordinating care for tunn HD cath placement

## 2023-05-27 NOTE — Plan of Care (Signed)
Will continue to monitor patient.

## 2023-05-27 NOTE — Progress Notes (Signed)
Heart Failure Navigator Progress Note  Assessed for Heart & Vascular TOC clinic readiness.  Patient does not meet criteria due to Advanced Heart Failure Team patient of Dr. Shirlee Latch  Navigator will sign off at this time.  Roxy Horseman, RN, BSN [facility] Heart Failure Navigator Secure Chat Only

## 2023-05-27 NOTE — Progress Notes (Addendum)
HD#3 SUBJECTIVE:  Patient Summary: Phillip Young is a 37 y.o. man with a pertinent PMH of ESRD on MWF HD, HFrEF, chronic hypoxic respiratory failure on 5L [place], Afib on Eliquis who presented with shortness of breath that progressed to fevers and chills and is now admitted for Sepsis / MSSA bacteremia.  Overnight Events: None  Interim History: Pt without acute complaint this morning - denies dyspnea, CP, fevers, chills. He is demoralized though not unpleasant regarding his neuropathic pain and foot drop, expressing frustration that is previous illness and intubation resulted in this condition.   OBJECTIVE:  Vital Signs: Vitals:   05/27/23 0200 05/27/23 0400 05/27/23 0505 05/27/23 0600  BP:   108/79   Pulse:   82   Resp: 20 20  20   Temp:   98.8 F (37.1 C)   TempSrc:   Oral   SpO2:  95%    Weight:      Height:       Supplemental O2: Nasal Cannula SpO2: 95 % O2 Flow Rate (L/min): 5 L/min  Filed Weights   05/21/23 1156 05/23/23 0850 05/23/23 1356  Weight: (!) 167.5 kg (!) 166.2 kg (!) 161.3 kg     Intake/Output Summary (Last 24 hours) at 05/27/2023 1551 Last data filed at 05/27/2023 0600 Gross per 24 hour  Intake 720 ml  Output 25 ml  Net 695 ml   Net IO Since Admission: -14,375.37 mL [05/27/23 1551]  Physical Exam: Physical Exam Constitutional:      General: He is not in acute distress.    Appearance: Normal appearance. He is obese. He is not ill-appearing.  HENT:     Mouth/Throat:     Mouth: Mucous membranes are moist.  Cardiovascular:     Rate and Rhythm: Normal rate and regular rhythm.     Pulses: Normal pulses.  Pulmonary:     Effort: Pulmonary effort is normal.     Breath sounds: Normal breath sounds. No wheezing or rales.  Abdominal:     General: Abdomen is flat.     Palpations: Abdomen is soft.     Tenderness: There is no abdominal tenderness.  Musculoskeletal:        General: Tenderness present.     Right lower leg: No edema.     Left lower leg: No  edema.     Comments: Tender b/l LE due to neuropathy  Skin:    General: Skin is warm and dry.     Capillary Refill: Capillary refill takes less than 2 seconds.  Neurological:     General: No focal deficit present.     Mental Status: He is alert and oriented to person, place, and time.  Psychiatric:        Mood and Affect: Mood normal.        Behavior: Behavior normal.     Patient Lines/Drains/Airways Status     Active Line/Drains/Airways     Name Placement date Placement time Site Days   Peripheral IV 05/24/23 20 G 1.75" Anterior;Right Forearm 05/24/23  1958  Forearm  3   Pressure Injury 02/03/23 Coccyx Mid Stage 2 -  Partial thickness loss of dermis presenting as a shallow open injury with a red, pink wound bed without slough. 02/03/23  0800  -- 113   Pressure Injury 05/18/23 Buttocks Right Stage 3 -  Full thickness tissue loss. Subcutaneous fat may be visible but bone, tendon or muscle are NOT exposed. 05/18/23  2300  -- 9   Pressure Injury 05/18/23  Buttocks Left Stage 2 -  Partial thickness loss of dermis presenting as a shallow open injury with a red, pink wound bed without slough. 05/18/23  2300  -- 9             ASSESSMENT/PLAN:  Assessment: Principal Problem:   Sepsis ([name]) Active Problems:   ESRD (end stage renal disease) on dialysis ([name])   Congestive heart failure ([name])   Acute on chronic hypoxic respiratory failure ([name])   Pressure injury of skin   MSSA bacteremia   Phillip Young is a 37 y.o. man with a pertinent PMH of ESRD on MWF HD, HFrEF, chronic hypoxic respiratory failure on 5L [place], Afib on Eliquis who presented with shortness of breath that progressed to fevers and chills and is now admitted for Sepsis / MSSA bacteremia.   MSSA Bacteremia Pt was to be discharged Tuesday before becoming ill with rigors, fever, tachycardia, hypotension. Septic with MSSA bacteremia, now on Ancef and undergoing line holiday. Blood pressures improved today. Mentation remains  well. White count resolving, pt afebrile. Today his primary symptoms are generalized joint pains. HD port replaced today, 72 hr holiday. ID is involved, thank you. - Cefazolin 1g IV 143ml/hr q 24h  - repeat cultures today - Map goal > 65. fluid boluses if consistently below. - IR has changed HD port and then 72hr line holiday - replacement and HD on Monday - TTE yesterday without signs of cardiac involvement   Paroxysmal Afib RVR Chronic Afib on Anticoagulation Pt went into RVR on Tuesday in the setting of his sepsis, stable since. Cardiology involved, thank you - might need higher digoxin if rates become uncontrolled but no aggressive rate controls in setting of sepsis compensation, but no changes at this time. This patient is refractory to cardioversion x 3 and, and not on amiodarone due to amiodarone toxicity. Beta blockers poor choice due to hypotension. Chronic anticoagulated with Eliquis 5 mg. He is asymptomatic. - Resume Eliquis 5mg  every day. Will hold Monday for HD placement.   Volume overload ESRD on HD MWF Chronic Hypotension Pt stable, weight is down, and attending dialysis without problems. - Line holiday through weekend and then continue HD on MWF - monitor electrolytes and volume status - can do VVS consult only if emergent need for HD  - Midodrine 30 mg 3 times daily for hypotension - Gabapentin 300 mg 3 times daily after HD for neuropathy - Fosrenol 2000 mg TID with meals   Barriers to dialysis as outpatient: After discharge from previous long hospital/rehab stay, pt was unable attend dialysis multiple times as unable to get to his transportation - he cannot ambulate or safely get himself into a car. He cannot walk up stairs and his apartment is at a ground/basement level requiring him to travel around the back and up a hill which is a significant challenge for him and his helpers.   HFrEF Chronic Respiratory Failure Chronic Hypotension Patient wears 5 L [place] at  baseline, and consistently at baseline between 4-5L. Some transient hypoxia at admission long resolved. He does have LE edema that is slowly improving and his weight is going down - Digoxin 0.25 3 times weekly.  - Midodrine 30 mg 3 times daily - Pt not on GDMT as hypotension requiring midodrine is a limiting factor. Will continue to evaluate if there is a role here. - 5L [place] is baseline, remains there   Last echo done in July with EF 20-25%, severely decreased LV function, LVH, and LV  diastolic function unable to be evaluated. He has been evaluated by cardiology, not a candidate for nodal ablation, ICD or pacemaker.  He is yet to follow-up with cardiology outpatient.     Chronic Stable Issues  Ambulatory Dysfunction Foot Drop R foot Pt with immense difficulty navigating the out-of-hospital environment due to deconditioning from his long prior hospitalization, body habitus, foot drop (R side, originated when he was sedated in ICU this summer), neuropathy. He is unable to get to outpatient dialysis in part due to this, as well as general transportation challenges. Was not able to attend any outpatient dialysis sessions since leaving rehab on 10/18. He feels somewhat more comfortable on his feet now that his swelling is reduced. - PT/OT involved, thank you. Current recs for SNF. Recommend OP evaluation of his foot drop.   Neuropathic pain Dry feet - Oxycodone 15 mg. - gabapentin 300 after dialysis per above - Urea lotion  - Lidocaine lotion    Best Practice: Diet: Renal diet IVF: none VTE: Eliquis Code: Full AB: None Therapy Recs: SNF DISPO: Anticipated discharge next week to SNF vs home pending HD port  Signature: [name], D.O.  Internal Medicine Resident, PGY-1 [facility]  Pager: [phone] 3:51 PM, 05/27/2023   Please contact the on call pager after 5 pm and on weekends at [phone].

## 2023-05-27 NOTE — Progress Notes (Signed)
  [facility] Progress Note   Subjective:    Seen and examined patient at bedside. IR seeing for new [facility] Monday.   Objective Vitals:   05/27/23 0200 05/27/23 0400 05/27/23 0505 05/27/23 0600  BP:   108/79   Pulse:   82   Resp: 20 20  20   Temp:   98.8 F (37.1 C)   TempSrc:   Oral   SpO2:  95%    Weight:      Height:       Physical Exam General: Chronically ill-appearing, Obese. NAD. Nasal O2 in place (5L is baseline) Heart: Irregular, no murmurs, gallops, or rubs Lungs: Clear anteriorly Abdomen: soft, non-tender Extremities: No LE edema Dialysis Access: TDC in R chest  Filed Weights   05/21/23 1156 05/23/23 0850 05/23/23 1356  Weight: (!) 167.5 kg (!) 166.2 kg (!) 161.3 kg    Dialysis Orders: MWF at [facility] 4.5hr, 400/800, EDW 172kg, 2K/2Ca bath, TDC, no heparin (+ HIT)  Assessment/Plan: 1. Acute Hypoxic RF: In setting of missed HD due to transportation issues. Improving. 2. MSSA Bacteremia: on IV Vanc and Cefepime. IR consulted. TDC removed this morning by IR and now on line holiday. Plan for new [facility] placement on 05/30/23 in the AM-order already placed. Watch labs and volume status closely.  3. Paroxysmal A-fib RVR: Cardiology on board. On Eliquis and Digoxin. Not a candidate for Amiodarone 2nd toxicity and elevated LFTs. Not a candidate for ablation or pacemaker 2nd infection risk. 4. ESRD: Back on MWF - next HD 05/30/23 after new TDC placement. Maximizing UF as tolerated. + HIT - no heparin. Renal US showed no hydronephrosis and increasing echogenicity consistent with hepatic steatosis. See #2 5. HypoTN/volume: BP stable on high dose midodrine (30mg  TID), now under EDW, will need EDW lowered. 6. Anemia: Hgb 9.1 - will raise Aranesp q Sat while here. 7. Secondary hyperparathyroidism: CorrCa high, Phos okay - continue lanthanum binders. No VDRA. Check PTH. 8. Nutrition: Alb low, continue supps. 9. HFrEF: On digoxin + Eliquis. 10. Obesity  Vinson Moselle   MD  [facility] 05/27/2023, 4:49 PM  Recent Labs  Lab 05/24/23 1914 05/25/23 0446 05/26/23 0633 05/27/23 0636  HGB 9.6* 8.6* 9.1* 9.2*  ALBUMIN 3.0*  --   --  2.5*  CALCIUM 10.4* 10.0 9.7 9.4  PHOS 3.1 3.5  --  4.5  CREATININE 7.74* 8.19* 6.25* 7.64*  K 4.5 4.4 4.1 4.1    Inpatient medications:  apixaban  5 mg Oral BID   [START ON 05/28/2023] darbepoetin (ARANESP) injection - DIALYSIS  150 mcg Subcutaneous Q Sat-1800   digoxin  0.125 mg Oral Once per day on Monday Wednesday Friday   feeding supplement (NEPRO CARB STEADY)  237 mL Oral BID BM   gabapentin  300 mg Oral Q M,W,F-HD   Gerhardt's butt cream   Topical TID   lanthanum  2,000 mg Oral TID WC   leptospermum manuka honey  1 Application Topical Daily   levothyroxine  25 mcg Oral Q0600   lidocaine  10 mL Infiltration Once   midodrine  30 mg Oral TID WC   urea   Topical Daily     ceFAZolin (ANCEF) IV 1 g (05/27/23 0949)   acetaminophen **OR** acetaminophen, docusate sodium, ondansetron (ZOFRAN) IV, oxyCODONE, polyethylene glycol

## 2023-05-27 NOTE — Progress Notes (Signed)
[facility]  Date of Admission:  05/18/2023   Total days of inpatient antibiotics 3  Principal Problem:   Sepsis ([name]) Active Problems:   ESRD (end stage renal disease) on dialysis ([name])   Congestive heart failure ([name])   Acute on chronic hypoxic respiratory failure ([name])   Pressure injury of skin   MSSA bacteremia          Assessment: 37 year old male MSSA bacteremia 03/2021 with  paraspinal abscess, left sacral OM and left SI joint septic arthritis,  A-fib, heart failure reduced ejection fraction, chronic hypoxic respiratory failure on 5 L nasal cannula, ESRD via HD admitted with acute on respiratory failure and volume overloaded due to missed dialysis.  Developed fever during hospitalization and found to have MSSA bacteremia #MSSA bacteremia likely secondary to line infection/hospital associated - Blood cultures on 10/29 2/2 MSSA repeat blood cultures on 10/21 no growth - Had right IJ x 2+months, removed on 10/31 - Buttock wounds, lower extremity wound not concerning for infection Recommendations: -Needs 72-hour line holiday from removal of central line on 10/31.  Communicated with IR plan on placement 11/4. - Repeat blood cultures on 11/1 pending.  Follow-up blood cultures from 10/31 and 11/1 to ensure clearance. - Continue cefazolin. - Patient is not a candidate for TEE per primary's discussion with Dr. Anne Fu given morbid obesity, 4 to 5 L oxygen requirement, on 30 mg 3 times daily midodrine, SBP. - Given TEE is not able to obtain will treat with 6 weeks of endovascular course from negative cultures on 10/31 EOT 12/11  ID will follow peripherally Microbiology:   Antibiotics: Cefazolin 10/30-present Cefepime 10/29 Vancomycin 10/29 Cultures: Blood 10/29 2/2 MSSA 10/31 no growth Urine  Other 10/208 RVP negative  SUBJECTIVE: Resting in bed.  No new complaints.  Reports he feels better. Interval: Afebrile overnight  Review of  Systems: Review of Systems  All other systems reviewed and are negative.    Scheduled Meds:  apixaban  5 mg Oral BID   [START ON 05/28/2023] darbepoetin (ARANESP) injection - DIALYSIS  150 mcg Subcutaneous Q Sat-1800   digoxin  0.125 mg Oral Once per day on Monday Wednesday Friday   feeding supplement (NEPRO CARB STEADY)  237 mL Oral BID BM   gabapentin  300 mg Oral Q M,W,F-HD   Gerhardt's butt cream   Topical TID   lanthanum  2,000 mg Oral TID WC   leptospermum manuka honey  1 Application Topical Daily   levothyroxine  25 mcg Oral Q0600   lidocaine  10 mL Infiltration Once   midodrine  30 mg Oral TID WC   urea   Topical Daily   Continuous Infusions:   ceFAZolin (ANCEF) IV 1 g (05/27/23 0949)   PRN Meds:.acetaminophen **OR** acetaminophen, docusate sodium, ondansetron (ZOFRAN) IV, oxyCODONE, polyethylene glycol 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, SRA positive   Metoprolol Other (See Comments)    near syncope   Amoxicillin Other (See Comments)    Was hospitalized    Other Swelling and Other (See Comments)    Steroids Fluid seeping out of legs     OBJECTIVE: Vitals:   05/27/23 0200 05/27/23 0400 05/27/23 0505 05/27/23 0600  BP:   108/79  Pulse:   82   Resp: 20 20  20   Temp:   98.8 F (37.1 C)   TempSrc:   Oral   SpO2:  95%    Weight:      Height:       Body mass index is 48.23 kg/m.  Physical Exam Constitutional:      General: He is not in acute distress.    Appearance: He is normal weight. He is not toxic-appearing.  HENT:     Head: Normocephalic and atraumatic.     Right Ear: External ear normal.     Left Ear: External ear normal.     Nose: No congestion or rhinorrhea.     Mouth/Throat:     Mouth: Mucous membranes are moist.     Pharynx:  Oropharynx is clear.  Eyes:     Extraocular Movements: Extraocular movements intact.     Conjunctiva/sclera: Conjunctivae normal.     Pupils: Pupils are equal, round, and reactive to light.  Cardiovascular:     Rate and Rhythm: Normal rate and regular rhythm.     Heart sounds: No murmur heard.    No friction rub. No gallop.  Pulmonary:     Effort: Pulmonary effort is normal.     Breath sounds: Normal breath sounds.  Abdominal:     General: Abdomen is flat. Bowel sounds are normal.     Palpations: Abdomen is soft.  Musculoskeletal:        General: No swelling.     Cervical back: Normal range of motion and neck supple.  Skin:    General: Skin is warm and dry.  Neurological:     General: No focal deficit present.     Mental Status: He is oriented to person, place, and time.  Psychiatric:        Mood and Affect: Mood normal.       Lab Results Lab Results  Component Value Date   WBC 6.5 05/27/2023   HGB 9.2 (L) 05/27/2023   HCT 28.9 (L) 05/27/2023   MCV 98.3 05/27/2023   PLT 172 05/27/2023    Lab Results  Component Value Date   CREATININE 7.64 (H) 05/27/2023   BUN 31 (H) 05/27/2023   NA 132 (L) 05/27/2023   K 4.1 05/27/2023   CL 96 (L) 05/27/2023   CO2 25 05/27/2023    Lab Results  Component Value Date   ALT 26 05/18/2023   AST 41 05/18/2023   ALKPHOS 149 (H) 05/18/2023   BILITOT 0.7 05/18/2023        Danelle Earthly, MD [facility] 05/27/2023, 1:47 PM I have personally spent 52 minutes involved in face-to-face and non-face-to-face activities for this patient on the day of the visit. Professional time spent includes the following activities: Preparing to see the patient (review of tests), Obtaining and/or reviewing separately obtained history (admission/discharge record), Performing a medically appropriate examination and/or evaluation , Ordering medications/tests/procedures, referring and communicating with  other health care professionals, Documenting clinical information in the EMR, Independently interpreting results (not separately reported), Communicating results to the patient/family/caregiver, Counseling and educating the patient/family/caregiver and Care coordination (not separately reported).

## 2023-05-27 NOTE — Progress Notes (Addendum)
   Patient Name: Phillip Young Date of Encounter: 05/27/2023 [facility] Cardiologist: Marca Ancona, MD   Interval Summary  .    Patient generally feeling okay this morning. No cardiac symptoms. Primary concern today is neuropathic pain in lower extremities. Also tells me today he is not sure he wants new dialysis access established.  Vital Signs .    Vitals:   05/27/23 0200 05/27/23 0400 05/27/23 0505 05/27/23 0600  BP:   108/79   Pulse:   82   Resp: 20 20  20   Temp:   98.8 F (37.1 C)   TempSrc:   Oral   SpO2:  95%    Weight:      Height:        Intake/Output Summary (Last 24 hours) at 05/27/2023 0718 Last data filed at 05/27/2023 0600 Gross per 24 hour  Intake 720 ml  Output 25 ml  Net 695 ml      05/23/2023    1:56 PM 05/23/2023    8:50 AM 05/21/2023   11:56 AM  Last 3 Weights  Weight (lbs) 355 lb 9.6 oz 366 lb 6.5 oz 369 lb 4.3 oz  Weight (kg) 161.3 kg 166.2 kg 167.5 kg      Telemetry/ECG    Rate controlled afib, 80s-90s - Personally Reviewed  Physical Exam .   GEN: No acute distress.   Neck: No JVD Cardiac: irregularly irregular, no murmurs, rubs, or gallops.  Respiratory: Clear to auscultation bilaterally. GI: Soft, nontender, non-distended  MS: No edema  Assessment & Plan .     Paroxysmal atrial fibrillation with RVR  Patient with hx difficult to manage/refractory afib. Hx multiple cardioversions and failed ablation attempt December 2023. Intolerant of Amiodarone. Patient developed afib with RVR this admission in the setting of acute on chronic hypoxic respiratory failure following missed HD.   Rates stable on telemetry, continue Digoxin 0.125mg  Mon, Weds, Fri Patient now back on Eliquis 5mg  BID following TDC removal by IR on 10/31  HFrEF (EF 20-25%) Nonischemic cardiomyopathy  TTE this admission with LVEF ~30%, global hypokinesis. GDMT limited by chronic hypotension and ESRD.  Volume management per HD. Patient had net 5L U[place]  yesterday. Continue Midodrine for BP support Not a candidate for advanced therapies/ICD  Sepsis MSSA bacteremia Hypoxic respiratory failure  Patient developed sepsis with MSSA bacteremia this admission. There has been a question about TEE but given obesity, chronic hypoxic respiratory failure, hypotension requiring daily midodrine, TEE extremely high risk and deferred.  Abx/management per primary team   For questions or updates, please contact [facility] Please consult [email or web address] for contact info under        Signed, Perlie Gold, PA-C   Personally seen and examined. Agree with above.  Once again neuropathic foot pain. Currently 355 pounds.  Down 10 pounds after dialysis session.  Catheter has been removed. He is not a good candidate for TEE at this time given very high risk for complications.  Continue to treat MSSA bacteremia. Back on Eliquis, paroxysmal atrial fibrillation, continues with low-dose Monday Wednesday Friday dosing of digoxin.  Levels have been stable.  Donato Schultz, MD

## 2023-05-28 DIAGNOSIS — I480 Paroxysmal atrial fibrillation: Secondary | ICD-10-CM | POA: Diagnosis not present

## 2023-05-28 DIAGNOSIS — I4819 Other persistent atrial fibrillation: Secondary | ICD-10-CM | POA: Diagnosis not present

## 2023-05-28 DIAGNOSIS — Z992 Dependence on renal dialysis: Secondary | ICD-10-CM | POA: Diagnosis not present

## 2023-05-28 DIAGNOSIS — B9561 Methicillin susceptible Staphylococcus aureus infection as the cause of diseases classified elsewhere: Secondary | ICD-10-CM | POA: Diagnosis not present

## 2023-05-28 DIAGNOSIS — A4101 Sepsis due to Methicillin susceptible Staphylococcus aureus: Secondary | ICD-10-CM | POA: Diagnosis not present

## 2023-05-28 DIAGNOSIS — G629 Polyneuropathy, unspecified: Secondary | ICD-10-CM

## 2023-05-28 LAB — BASIC METABOLIC PANEL
Anion gap: 15 (ref 5–15)
BUN: 37 mg/dL — ABNORMAL HIGH (ref 6–20)
CO2: 24 mmol/L (ref 22–32)
Calcium: 9.6 mg/dL (ref 8.9–10.3)
Chloride: 96 mmol/L — ABNORMAL LOW (ref 98–111)
Creatinine, Ser: 8.79 mg/dL — ABNORMAL HIGH (ref 0.61–1.24)
GFR, Estimated: 7 mL/min — ABNORMAL LOW (ref >60)
Glucose, Bld: 85 mg/dL (ref 70–99)
Potassium: 4.3 mmol/L (ref 3.5–5.1)
Sodium: 135 mmol/L (ref 135–145)

## 2023-05-28 LAB — CBC
HCT: 30.7 % — ABNORMAL LOW (ref 39.0–52.0)
Hemoglobin: 9.6 g/dL — ABNORMAL LOW (ref 13.0–17.0)
MCH: 31.1 pg (ref 26.0–34.0)
MCHC: 31.3 g/dL (ref 30.0–36.0)
MCV: 99.4 fL (ref 80.0–100.0)
Platelets: 204 10*3/uL (ref 150–400)
RBC: 3.09 MIL/uL — ABNORMAL LOW (ref 4.22–5.81)
RDW: 15.7 % — ABNORMAL HIGH (ref 11.5–15.5)
WBC: 5.6 10*3/uL (ref 4.0–10.5)
nRBC: 0 % (ref 0.0–0.2)

## 2023-05-28 NOTE — Progress Notes (Signed)
[facility] Progress Note   Subjective:    Seen and examined patient at bedside.   Objective Vitals:   05/27/23 0600 05/27/23 2311 05/28/23 0441 05/28/23 0826  BP:  117/87 103/64 111/70  Pulse:   80 80  Resp: 20 16 12 15   Temp:  98 F (36.7 C) 98.2 F (36.8 C) 98.4 F (36.9 C)  TempSrc:  Tympanic Oral Oral  SpO2:   99%   Weight:   (!) 162.9 kg   Height:       Physical Exam General: Chronically ill-appearing, Obese. NAD. Nasal O2 in place (5L is baseline) Heart: Irregular, no murmurs, gallops, or rubs Lungs: Clear anteriorly Abdomen: soft, non-tender Extremities: No LE edema Dialysis Access: TDC in R chest  Filed Weights   05/23/23 0850 05/23/23 1356 05/28/23 0441  Weight: (!) 166.2 kg (!) 161.3 kg (!) 162.9 kg    Dialysis Orders: MWF at [facility] 4.5h   400/800    172kg   2K/2Ca bath   TDC   No heparin (+ HIT)  Assessment/Plan: # Acute Hypoxic RF: In setting of missed HD due to transportation issues. Improved. 10kg under dry wt.   # MSSA Bacteremia: occurred in the hospital. IV Vanc and Cefepime. IR consulted. TDC removed by IR 10/31, is now on line holiday. Plan for new [facility] placement on 11/4 in the AM-order already placed.   # Paroxysmal A-fib RVR: Cardiology on board. On Eliquis and Digoxin. Not a candidate for Amiodarone 2nd toxicity and elevated LFTs. Not a candidate for ablation or pacemaker 2nd infection risk.  # ESRD: Back on MWF - next HD 05/30/23 after new TDC placement.   # +HIT - no heparin  # HypoTN/volume: BP stable on high dose midodrine (30mg  TID), now under EDW, will need EDW lowered.  # Anemia: Hgb 9.1 - will raise Aranesp q Sat while here.  # Secondary hyperparathyroidism: CorrCa high, Phos okay - continue lanthanum binders. No VDRA. Check PTH.  # Nutrition: Alb low, continue supps.  # HFrEF: On digoxin + Eliquis.  # Obesity  Vinson Moselle  MD  [facility] 05/28/2023, 12:04 PM  Recent Labs  Lab 05/24/23 1914 05/25/23 0446  05/26/23 0633 05/27/23 0636 05/28/23 0342  HGB 9.6* 8.6*   < > 9.2* 9.6*  ALBUMIN 3.0*  --   --  2.5*  --   CALCIUM 10.4* 10.0   < > 9.4 9.6  PHOS 3.1 3.5  --  4.5  --   CREATININE 7.74* 8.19*   < > 7.64* 8.79*  K 4.5 4.4   < > 4.1 4.3   < > = values in this interval not displayed.    Inpatient medications:  apixaban  5 mg Oral BID   darbepoetin (ARANESP) injection - DIALYSIS  150 mcg Subcutaneous Q Sat-1800   digoxin  0.125 mg Oral Once per day on Monday Wednesday Friday   feeding supplement (NEPRO CARB STEADY)  237 mL Oral BID BM   gabapentin  300 mg Oral Q M,W,F-HD   Gerhardt's butt cream   Topical TID   lanthanum  2,000 mg Oral TID WC   leptospermum manuka honey  1 Application Topical Daily   levothyroxine  25 mcg Oral Q0600   lidocaine  10 mL Infiltration Once   midodrine  30 mg Oral TID WC   urea   Topical Daily     ceFAZolin (ANCEF) IV 1 g (05/28/23 0923)   acetaminophen **OR** acetaminophen, docusate sodium, ondansetron (ZOFRAN) IV, oxyCODONE,  polyethylene glycol

## 2023-05-28 NOTE — Progress Notes (Signed)
                 Interval history Concerned about morning joint pain and stiffness that resolves after some time awake.  Still having some leg pain that is limiting his mobility and ability to do good work with PT/OT.  Overall feeling better since starting antibiotics, encouraged.  Physical exam Blood pressure 104/72, pulse 68, temperature 97.9 F (36.6 C), temperature source Oral, resp. rate 18, height 6' (1.829 m), weight (!) 162.9 kg, SpO2 99%.  No distress Heart rate is normal, irregular, strong radial pulse, no apparent murmurs Breathing comfortably on 5 L via nasal cannula, bilateral lungs clear without crackles Skin is warm and dry Alert and oriented Pleasant and congruent affect  Labs, images, and other studies Electrolytes and bicarb are stable Anemia stable  Assessment and plan Hospital day 4  Phillip Young is a 37 y.o. admitted for acute on chronic hypoxic respiratory failure secondary to under dialysis with a hospital course complicated by MSSA bacteremia, now status post HD catheter removal, overall stable.  Principal Problem:   Sepsis ([name]) Active Problems:   ESRD (end stage renal disease) on dialysis ([name])   Congestive heart failure ([name])   Acute on chronic hypoxic respiratory failure ([name])   Pressure injury of skin   MSSA bacteremia  Sepsis MSSA bacteremia Afebrile since starting IV antibiotics.  HD catheter removed yesterday, line holiday through Monday at which point it will be replaced and pending no further complications anticipate he will be medically ready for discharge.  TTE without obvious vegetations, TEE would be best for ruling out endocarditis but risks outweigh benefits for this person.  Thus, we will have 6 weeks of IV antibiotic therapy from negative cultures on 05/26/2023. - Continue cefazolin - Anticipate HD catheter replacement on Monday  Pressure injury of skin Continue good wound care.  Debility Immobility Will reach out to PT/OT  about padded footwear to make ambulatory exercises more tolerable.    ESRD HD catheter holiday over the weekend.  Volume status, electrolytes, and bicarb are stable.  Chronic congestive heart failure LVEF 30 to 35%.  Well compensated.  Continue digoxin 0.125 mg p.o. 3 times weekly  Persistent atrial fibrillation Rate controlled at present.  Amiodarone poor choice given young age and risk to lungs.  Ablation risks outweigh benefits.  Continue anticoagulation with apixaban 5 mg p.o. twice daily.  Chronic hypoxic respiratory failure Stable supplemental O2 requirement.  Hypotension Chronic, doing okay on midodrine 30 mg 3 times daily AC.  Neuropathy Unfortunately has pretty bad neuropathy and foot drop after recent, prolonged hospitalization.  Continue gabapentin.  Diet: Renal with 1200 mL fluid restriction IVF: N/A VTE: Place TED hose Start: 05/22/23 1313  Code: Full PT/OT recommendations: SNF for subacute rehab TOC recommendations: Following Family Update: N/A  Discharge plan: Pending replacement of HD catheter on Monday, SNF versus home.  Marrianne Mood MD 05/28/2023, 12:37 PM  Pager: [phone] After 5pm or weekend: [phone]

## 2023-05-28 NOTE — Progress Notes (Signed)
Progress Note  Patient Name: Phillip Young Date of Encounter: 05/28/2023  Primary Cardiologist: Marca Ancona, MD  Interval Summary   No palpitations or chest pain.  Mentions his concerns and challenges related to peripheral neuropathy and foot drop.  Vital Signs    Vitals:   05/27/23 0600 05/27/23 2311 05/28/23 0441 05/28/23 0826  BP:  117/87 103/64 111/70  Pulse:   80 80  Resp: 20 16 12 15   Temp:  98 F (36.7 C) 98.2 F (36.8 C) 98.4 F (36.9 C)  TempSrc:  Tympanic Oral Oral  SpO2:   99%   Weight:   (!) 162.9 kg   Height:       No intake or output data in the 24 hours ending 05/28/23 1029 Filed Weights   05/23/23 0850 05/23/23 1356 05/28/23 0441  Weight: (!) 166.2 kg (!) 161.3 kg (!) 162.9 kg    Physical Exam   GEN: No acute distress.   Cardiac: Irregularly irregular.  Respiratory: Nonlabored. Clear to auscultation bilaterally. GI: Soft, nontender, bowel sounds present. MS: No edema.  ECG/Telemetry    Telemetry reviewed showing atrial fibrillation in the 90s.  Labs    Chemistry Recent Labs  Lab 05/24/23 1914 05/25/23 0446 05/26/23 0633 05/27/23 0636 05/28/23 0342  NA 135   < > 130* 132* 135  K 4.5   < > 4.1 4.1 4.3  CL 98   < > 93* 96* 96*  CO2 24   < > 23 25 24   GLUCOSE 119*   < > 101* 83 85  BUN 17   < > 20 31* 37*  CREATININE 7.74*   < > 6.25* 7.64* 8.79*  CALCIUM 10.4*   < > 9.7 9.4 9.6  ALBUMIN 3.0*  --   --  2.5*  --   GFRNONAA 8*   < > 11* 9* 7*  ANIONGAP 13   < > 14 11 15    < > = values in this interval not displayed.    Hematology Recent Labs  Lab 05/26/23 0633 05/27/23 0636 05/28/23 0342  WBC 9.3 6.5 5.6  RBC 2.90* 2.94* 3.09*  HGB 9.1* 9.2* 9.6*  HCT 29.2* 28.9* 30.7*  MCV 100.7* 98.3 99.4  MCH 31.4 31.3 31.1  MCHC 31.2 31.8 31.3  RDW 15.8* 15.5 15.7*  PLT 182 172 204    Cardiac Studies   Echocardiogram 05/26/2023:  1. Left ventricular ejection fraction, by estimation, is 30 to 35%. The  left ventricle has  moderately decreased function. The left ventricle  demonstrates global hypokinesis. The left ventricular internal cavity size  was severely dilated. Left  ventricular diastolic parameters are indeterminate.   2. Right ventricular systolic function is mildly reduced. The right  ventricular size is not well visualized. Tricuspid regurgitation signal is  inadequate for assessing PA pressure.   3. The mitral valve is normal in structure. Mild mitral valve  regurgitation.   4. The aortic valve is tricuspid. Aortic valve regurgitation is not  visualized.   5. The inferior vena cava is dilated in size with >50% respiratory  variability, suggesting right atrial pressure of 8 mmHg.   Assessment & Plan   1.  Paroxysmal to persistent atrial fibrillation with recent RVR.  Heart rate currently controlled on digoxin and he remains on Eliquis for stroke prophylaxis.  Has failed prior cardioversion attempts and did not tolerate amiodarone.  2.  HFrEF with nonischemic cardiomyopathy, LVEF 30 to 35% with mild RV dysfunction by recent echocardiogram.  Fluid status  managed per HD.  Hypotension limits GDMT.  Not candidate for ICD.  3.  MSSA bacteremia.  TTE shows no obvious vegetations and he is not felt to be a good candidate for TEE given high complication risk.  Being treated with antibiotic therapy and followed by ID.  Had [facility] removed by IR with line holiday and plan to replace catheter on Monday.  Continue Eliquis, Lanoxin, and midodrine.  Heart rate control in atrial fibrillation this morning.  No changes were made today.  For questions or updates, please contact [facility] Please consult [email or web address] for contact info under   Signed, Nona Dell, MD  05/28/2023, 10:29 AM

## 2023-05-28 NOTE — Plan of Care (Signed)
  Problem: Education: Goal: Knowledge of General Education information will improve Description: Including pain rating scale, medication(s)/side effects and non-pharmacologic comfort measures Outcome: Progressing   Problem: Health Behavior/Discharge Planning: Goal: Ability to manage health-related needs will improve Outcome: Progressing   Problem: Clinical Measurements: Goal: Ability to maintain clinical measurements within normal limits will improve Outcome: Progressing Goal: Will remain free from infection Outcome: Progressing Goal: Diagnostic test results will improve Outcome: Progressing Goal: Respiratory complications will improve Outcome: Progressing Goal: Cardiovascular complication will be avoided Outcome: Progressing   Problem: Activity: Goal: Risk for activity intolerance will decrease Outcome: Progressing   Problem: Nutrition: Goal: Adequate nutrition will be maintained Outcome: Progressing   Problem: Coping: Goal: Level of anxiety will decrease Outcome: Progressing   Problem: Elimination: Goal: Will not experience complications related to bowel motility Outcome: Progressing Goal: Will not experience complications related to urinary retention Outcome: Progressing   Problem: Pain Management: Goal: General experience of comfort will improve Outcome: Progressing   Problem: Safety: Goal: Ability to remain free from injury will improve Outcome: Progressing   Problem: Skin Integrity: Goal: Risk for impaired skin integrity will decrease Outcome: Progressing   Problem: Education: Goal: Ability to demonstrate management of disease process will improve Outcome: Progressing Goal: Individualized Educational Video(s) Outcome: Progressing   Problem: Activity: Goal: Capacity to carry out activities will improve Outcome: Progressing   Problem: Cardiac: Goal: Ability to achieve and maintain adequate cardiopulmonary perfusion will improve Outcome: Progressing

## 2023-05-29 DIAGNOSIS — R7881 Bacteremia: Secondary | ICD-10-CM

## 2023-05-29 DIAGNOSIS — B9561 Methicillin susceptible Staphylococcus aureus infection as the cause of diseases classified elsewhere: Secondary | ICD-10-CM

## 2023-05-29 LAB — BASIC METABOLIC PANEL
Anion gap: 16 — ABNORMAL HIGH (ref 5–15)
BUN: 46 mg/dL — ABNORMAL HIGH (ref 6–20)
CO2: 25 mmol/L (ref 22–32)
Calcium: 9.8 mg/dL (ref 8.9–10.3)
Chloride: 94 mmol/L — ABNORMAL LOW (ref 98–111)
Creatinine, Ser: 10.54 mg/dL — ABNORMAL HIGH (ref 0.61–1.24)
GFR, Estimated: 6 mL/min — ABNORMAL LOW (ref >60)
Glucose, Bld: 90 mg/dL (ref 70–99)
Potassium: 4.4 mmol/L (ref 3.5–5.1)
Sodium: 135 mmol/L (ref 135–145)

## 2023-05-29 LAB — PHOSPHORUS: Phosphorus: 4.6 mg/dL (ref 2.5–4.6)

## 2023-05-29 LAB — MAGNESIUM: Magnesium: 2 mg/dL (ref 1.7–2.4)

## 2023-05-29 MED ORDER — CHLORHEXIDINE GLUCONATE CLOTH 2 % EX PADS
6.0000 | MEDICATED_PAD | Freq: Every day | CUTANEOUS | Status: DC
  Administered 2023-05-30 – 2023-05-31 (×2): 6 via TOPICAL

## 2023-05-29 NOTE — Progress Notes (Signed)
[facility] Progress Note   Subjective:    Pt seen in room. No c/o's. Wt's are down again today. 17kg down from 177 admit weight on 10/24.   Objective Vitals:   05/29/23 0531 05/29/23 0800 05/29/23 1206 05/29/23 1402  BP: 117/66 121/76 (!) 100/55 122/74  Pulse: 80  78   Resp: 20  17   Temp: 98 F (36.7 C) 98.8 F (37.1 C) 98.6 F (37 C)   TempSrc: Oral Oral Oral   SpO2: 100%  96%   Weight: (!) 159.6 kg     Height:       Physical Exam General: Chronically ill-appearing, Obese. NAD. Nasal O2 in place (5L is baseline) Heart: Irregular, no murmurs, gallops, or rubs Lungs: Clear anteriorly Abdomen: soft, non-tender Extremities: No LE edema Dialysis Access: TDC in R chest  Filed Weights   05/23/23 1356 05/28/23 0441 05/29/23 0531  Weight: (!) 161.3 kg (!) 162.9 kg (!) 159.6 kg    Dialysis Orders: MWF at [facility] 4.5h   400/800    172kg   2K/2Ca bath   TDC   No heparin (+ HIT)  Assessment/Plan: # Acute Hypoxic RF: In setting of missed HD due to transportation issues. Resolved. 12kg under dry wt.   # MSSA Bacteremia: occurred in the hospital. IV Vanc and Cefepime. IR consulted. TDC removed by IR 10/31, is now on line holiday. Plan [facility] placement on 11/4 in the AM-order already placed.   # Paroxysmal A-fib RVR: Cardiology on board. On Eliquis and Digoxin. Not a candidate for Amiodarone 2nd toxicity and elevated LFTs. Not a candidate for ablation or pacemaker 2nd infection risk.  # ESRD: Back on MWF - next HD 05/30/23 after new TDC placement.   # +HIT - no heparin  # HypoTN/volume: BP stable on high dose midodrine (30mg  TID), now under EDW, will need EDW lowered significantly  # Anemia: Hgb 9.1 - will raise Aranesp q Sat while here.  # Secondary hyperparathyroidism: CorrCa high, Phos okay - continue lanthanum binders. No VDRA. Check PTH.  # Nutrition: Alb low, continue supps.  # HFrEF: On digoxin + Eliquis.  # Obesity  Vinson Moselle  MD   [facility] 05/29/2023, 3:31 PM  Recent Labs  Lab 05/24/23 1914 05/25/23 0446 05/27/23 0636 05/28/23 0342 05/29/23 0405  HGB 9.6*   < > 9.2* 9.6*  --   ALBUMIN 3.0*  --  2.5*  --   --   CALCIUM 10.4*   < > 9.4 9.6 9.8  PHOS 3.1   < > 4.5  --  4.6  CREATININE 7.74*   < > 7.64* 8.79* 10.54*  K 4.5   < > 4.1 4.3 4.4   < > = values in this interval not displayed.    Inpatient medications:  apixaban  5 mg Oral BID   darbepoetin (ARANESP) injection - DIALYSIS  150 mcg Subcutaneous Q Sat-1800   digoxin  0.125 mg Oral Once per day on Monday Wednesday Friday   feeding supplement (NEPRO CARB STEADY)  237 mL Oral BID BM   gabapentin  300 mg Oral Q M,W,F-HD   Gerhardt's butt cream   Topical TID   lanthanum  2,000 mg Oral TID WC   leptospermum manuka honey  1 Application Topical Daily   levothyroxine  25 mcg Oral Q0600   lidocaine  10 mL Infiltration Once   midodrine  30 mg Oral TID WC   urea   Topical Daily  ceFAZolin (ANCEF) IV 1 g (05/29/23 1000)   acetaminophen **OR** acetaminophen, docusate sodium, ondansetron (ZOFRAN) IV, oxyCODONE, polyethylene glycol

## 2023-05-29 NOTE — Progress Notes (Signed)
HD#5 SUBJECTIVE:  Patient Summary: Phillip Young is a 37 y.o. man with a pertinent PMH of ESRD on MWF HD, HFrEF, chronic hypoxic respiratory failure on 5L [place], Afib on Eliquis who presented with shortness of breath that progressed to fevers and chills and is now admitted for Sepsis / MSSA bacteremia.   Overnight Events: None  Interim History: Pt without acute complaint this morning. Good spirits. Noted continued neuropathy and is requesting a boot - states the sensation of his feet on the floor is extremely cold/uncomfortable. Expressed a strong desire to improve his health.  OBJECTIVE:  Vital Signs: Vitals:   05/28/23 0826 05/28/23 1205 05/28/23 1644 05/29/23 0531  BP: 111/70 104/72 96/72 117/66  Pulse: 80 68 66 80  Resp: 15 18  20   Temp: 98.4 F (36.9 C) 97.9 F (36.6 C)  98 F (36.7 C)  TempSrc: Oral Oral  Oral  SpO2:    100%  Weight:    (!) 159.6 kg  Height:       Supplemental O2: Nasal Cannula SpO2: 100 % O2 Flow Rate (L/min): 5 L/min  Filed Weights   05/23/23 1356 05/28/23 0441 05/29/23 0531  Weight: (!) 161.3 kg (!) 162.9 kg (!) 159.6 kg    No intake or output data in the 24 hours ending 05/29/23 0739 Net IO Since Admission: -14,375.37 mL [05/29/23 0739]  Physical Exam: Physical Exam Constitutional:      General: He is not in acute distress.    Appearance: He is obese. He is not ill-appearing.  HENT:     Head: Normocephalic and atraumatic.     Mouth/Throat:     Mouth: Mucous membranes are moist.  Cardiovascular:     Rate and Rhythm: Normal rate. Rhythm irregular.     Pulses: Normal pulses.  Pulmonary:     Effort: Pulmonary effort is normal.     Breath sounds: Normal breath sounds. No wheezing, rhonchi or rales.  Abdominal:     General: Abdomen is flat. Bowel sounds are normal.     Palpations: Abdomen is soft.     Tenderness: There is no abdominal tenderness.  Musculoskeletal:        General: Tenderness present.     Right lower leg: No edema.      Left lower leg: No edema.     Comments: Unchanged tenderness to bilateral LE  Skin:    General: Skin is warm and dry.  Neurological:     General: No focal deficit present.     Mental Status: He is alert and oriented to person, place, and time.  Psychiatric:        Mood and Affect: Mood normal.        Behavior: Behavior normal.     Patient Lines/Drains/Airways Status     Active Line/Drains/Airways     Name Placement date Placement time Site Days   Peripheral IV 05/24/23 20 G 1.75" Anterior;Right Forearm 05/24/23  1958  Forearm  5   Pressure Injury 02/03/23 Coccyx Mid Stage 2 -  Partial thickness loss of dermis presenting as a shallow open injury with a red, pink wound bed without slough. 02/03/23  0800  -- 115   Pressure Injury 05/18/23 Buttocks Right Stage 3 -  Full thickness tissue loss. Subcutaneous fat may be visible but bone, tendon or muscle are NOT exposed. 05/18/23  2300  -- 11   Pressure Injury 05/18/23 Buttocks Left Stage 2 -  Partial thickness loss of dermis presenting as a shallow open injury  with a red, pink wound bed without slough. 05/18/23  2300  -- 11             ASSESSMENT/PLAN:  Assessment: Principal Problem:   Sepsis ([name]) Active Problems:   ESRD (end stage renal disease) on dialysis ([name])   Congestive heart failure ([name])   Acute on chronic hypoxic respiratory failure ([name])   Atrial fibrillation ([name])   Pressure injury of skin   MSSA bacteremia   Neuropathy   Plan: Sepsis MSSA bacteremia Remains afebrile since starting IV antibiotics.  HD catheter removed Friday, line holiday through Monday at which point it will be replaced and pending no further complications, anticipate he will be medically ready for discharge.  TTE without obvious vegetations, TEE would be best for ruling out endocarditis but risks outweigh benefits for this person.  Thus, we will have 6 weeks of IV antibiotic therapy from negative cultures on 05/26/2023. - Continue cefazolin -  Map goal > 65. fluid boluses if consistently below.  - Anticipate HD catheter replacement then HD on Monday. Possible to go home after.   Pressure injury of skin L buttocks. Continue good wound care.   Debility Immobility, foot drop Will reach out to PT/OT about padded footwear to make ambulatory exercises more tolerable. Would benefit from OP physical therapy.    ESRD HD catheter holiday over the weekend.  Volume status, electrolytes, and bicarb are stable.   Chronic HFrEF LVEF 30 to 35%.  Well compensated.  Continue digoxin 0.125 mg p.o. 3 times weekly   Persistent atrial fibrillation Rate controlled at present.  Amiodarone poor choice given young age and risk to lungs.  Ablation risks outweigh benefits.  Continue anticoagulation with apixaban 5 mg p.o. twice daily.   Chronic hypoxic respiratory failure Stable supplemental O2 requirement. 5L is baseline. Tolerates much less when at rest.   Hypotension Chronic, doing okay on midodrine 30 mg 3 times daily AC. Can do boluses if consistently low maps.   Neuropathy Unfortunately has pretty bad neuropathy and foot drop after recent, prolonged hospitalization.  Continue gabapentin, urea and lidocaine lotions.  Best Practice: Diet: Renal diet with 1200 mL fluid restriction IVF: none VTE: Place TED hose Start: 05/22/23 1313 Code: Full AB: Ancef Therapy Recs: SNF DISPO: Anticipated discharge  tomorrow or Tuesday to Home pending  dialysis port placement and HD .  Signature: [name], D.O.  Internal Medicine Resident, PGY-1 [facility]  Pager: [phone] 7:39 AM, 05/29/2023   Please contact the on call pager after 5 pm and on weekends at [phone].

## 2023-05-29 NOTE — Plan of Care (Signed)
Will continue to monitor patient.

## 2023-05-30 ENCOUNTER — Inpatient Hospital Stay (HOSPITAL_COMMUNITY): Payer: BC Managed Care – PPO

## 2023-05-30 DIAGNOSIS — N186 End stage renal disease: Secondary | ICD-10-CM

## 2023-05-30 DIAGNOSIS — Z992 Dependence on renal dialysis: Secondary | ICD-10-CM

## 2023-05-30 DIAGNOSIS — J9621 Acute and chronic respiratory failure with hypoxia: Secondary | ICD-10-CM | POA: Diagnosis not present

## 2023-05-30 DIAGNOSIS — I4819 Other persistent atrial fibrillation: Secondary | ICD-10-CM | POA: Diagnosis not present

## 2023-05-30 DIAGNOSIS — R7881 Bacteremia: Secondary | ICD-10-CM | POA: Diagnosis not present

## 2023-05-30 HISTORY — PX: IR FLUORO GUIDE CV LINE RIGHT: IMG2283

## 2023-05-30 HISTORY — PX: IR US GUIDE VASC ACCESS RIGHT: IMG2390

## 2023-05-30 LAB — PARATHYROID HORMONE, INTACT (NO CA): PTH: 30 pg/mL (ref 15–65)

## 2023-05-30 LAB — BASIC METABOLIC PANEL
Anion gap: 13 (ref 5–15)
BUN: 48 mg/dL — ABNORMAL HIGH (ref 6–20)
CO2: 25 mmol/L (ref 22–32)
Calcium: 10 mg/dL (ref 8.9–10.3)
Chloride: 97 mmol/L — ABNORMAL LOW (ref 98–111)
Creatinine, Ser: 11.66 mg/dL — ABNORMAL HIGH (ref 0.61–1.24)
GFR, Estimated: 5 mL/min — ABNORMAL LOW (ref >60)
Glucose, Bld: 87 mg/dL (ref 70–99)
Potassium: 4.8 mmol/L (ref 3.5–5.1)
Sodium: 135 mmol/L (ref 135–145)

## 2023-05-30 MED ORDER — FENTANYL CITRATE (PF) 100 MCG/2ML IJ SOLN
INTRAMUSCULAR | Status: AC
  Filled 2023-05-30: qty 2

## 2023-05-30 MED ORDER — LIDOCAINE-EPINEPHRINE 1 %-1:100000 IJ SOLN
INTRAMUSCULAR | Status: AC
Start: 2023-05-30 — End: ?
  Filled 2023-05-30: qty 1

## 2023-05-30 MED ORDER — CEFAZOLIN (ANCEF) 1 G IV SOLR: INTRAVENOUS | Status: AC | PRN

## 2023-05-30 MED ORDER — GELATIN ABSORBABLE 12-7 MM EX MISC
CUTANEOUS | Status: AC
  Filled 2023-05-30: qty 1

## 2023-05-30 MED ORDER — NEPRO/CARBSTEADY PO LIQD: 237.0000 mL | ORAL | Status: DC | PRN

## 2023-05-30 MED ORDER — CEFAZOLIN SODIUM-DEXTROSE 2-4 GM/100ML-% IV SOLN
2.0000 g | INTRAVENOUS | Status: DC
  Administered 2023-06-01: 2 g via INTRAVENOUS
  Filled 2023-05-30 (×3): qty 100

## 2023-05-30 MED ORDER — LIDOCAINE-PRILOCAINE 2.5-2.5 % EX CREA: 1.0000 | TOPICAL_CREAM | CUTANEOUS | Status: DC | PRN

## 2023-05-30 MED ORDER — ONDANSETRON HCL 4 MG/2ML IJ SOLN
INTRAMUSCULAR | Status: AC
  Filled 2023-05-30: qty 2

## 2023-05-30 MED ORDER — PENTAFLUOROPROP-TETRAFLUOROETH EX AERO: 1.0000 | INHALATION_SPRAY | CUTANEOUS | Status: DC | PRN

## 2023-05-30 MED ORDER — FENTANYL CITRATE (PF) 100 MCG/2ML IJ SOLN
INTRAMUSCULAR | Status: AC | PRN
  Administered 2023-05-30: 25 ug via INTRAVENOUS
  Administered 2023-05-30: 50 ug via INTRAVENOUS
  Administered 2023-05-30 (×2): 25 ug via INTRAVENOUS
  Administered 2023-05-30: 50 ug via INTRAVENOUS
  Administered 2023-05-30: 25 ug via INTRAVENOUS

## 2023-05-30 MED ORDER — MIDAZOLAM HCL 2 MG/2ML IJ SOLN
INTRAMUSCULAR | Status: AC | PRN
  Administered 2023-05-30 (×2): 1 mg via INTRAVENOUS

## 2023-05-30 MED ORDER — ANTICOAGULANT SODIUM CITRATE 4% (200MG/5ML) IV SOLN: 5.0000 mL | Status: DC | PRN

## 2023-05-30 MED ORDER — ALTEPLASE 2 MG IJ SOLR: 2.0000 mg | Freq: Once | INTRAMUSCULAR | Status: DC | PRN

## 2023-05-30 MED ORDER — LIDOCAINE HCL (PF) 1 % IJ SOLN: 5.0000 mL | INTRAMUSCULAR | Status: DC | PRN

## 2023-05-30 MED ORDER — IOHEXOL 300 MG/ML  SOLN
50.0000 mL | Freq: Once | INTRAMUSCULAR | Status: AC | PRN
  Administered 2023-05-30: 2 mL via INTRAVENOUS

## 2023-05-30 MED ORDER — ONDANSETRON HCL 4 MG/2ML IJ SOLN
INTRAMUSCULAR | Status: AC | PRN
  Administered 2023-05-30: 4 mg via INTRAVENOUS

## 2023-05-30 MED ORDER — CEFAZOLIN SODIUM-DEXTROSE 2-4 GM/100ML-% IV SOLN
2.0000 g | Freq: Once | INTRAVENOUS | Status: AC
  Administered 2023-05-30: 2 g via INTRAVENOUS
  Filled 2023-05-30: qty 100

## 2023-05-30 MED ORDER — ANTICOAGULANT SODIUM CITRATE 4% (200MG/5ML) IV SOLN
5.0000 mL | Freq: Once | Status: AC
  Administered 2023-05-30: 5 mL
  Filled 2023-05-30 (×2): qty 5

## 2023-05-30 MED ORDER — MIDAZOLAM HCL 2 MG/2ML IJ SOLN
INTRAMUSCULAR | Status: AC
  Filled 2023-05-30: qty 2

## 2023-05-30 NOTE — Progress Notes (Signed)
PHARMACY CONSULT NOTE FOR:  OUTPATIENT  PARENTERAL ANTIBIOTIC THERAPY (OPAT)  This OPAT order is informational only. Nephrology is aware and will give with HD.   Indication: MSSA bacteremia Regimen: Cefazolin 2 g IV MWF with HD End date: 07/06/23  IV antibiotic discharge orders are pended. To discharging provider:  please sign these orders via discharge navigator,  Select New Orders & click on the button choice - Manage This Unsigned Work.     Thank you for allowing pharmacy to be a part of this patient's care.  Griffin Dakin 05/30/2023, 12:31 PM

## 2023-05-30 NOTE — Progress Notes (Signed)
   Patient Name: Phillip Young Date of Encounter: 05/30/2023 [facility] Cardiologist: Marca Ancona, MD   Interval Summary  .    Patient seen this morning after having new dialysis catheter placed. Reports some discomfort around vascular access site. No bleeding/swelling noted. No chest pain or dyspnea at rest. Does report feeling winded with exertion and notes increased HR at the same time.   Vital Signs .    Vitals:   05/29/23 1206 05/29/23 1402 05/29/23 1709 05/29/23 2103  BP: (!) 100/55 122/74 124/77 115/74  Pulse: 78  67 60  Resp: 17  16 19   Temp: 98.6 F (37 C)  98.1 F (36.7 C) 97.9 F (36.6 C)  TempSrc: Oral  Oral Oral  SpO2: 96%  100%   Weight:      Height:        Intake/Output Summary (Last 24 hours) at 05/30/2023 0802 Last data filed at 05/29/2023 1832 Gross per 24 hour  Intake --  Output 50 ml  Net -50 ml      05/29/2023    5:31 AM 05/28/2023    4:41 AM 05/23/2023    1:56 PM  Last 3 Weights  Weight (lbs) 351 lb 14.4 oz 359 lb 2.1 oz 355 lb 9.6 oz  Weight (kg) 159.621 kg 162.9 kg 161.3 kg      Telemetry/ECG    Atrial fibrillation, ventricular rates 80s-100s - Personally Reviewed  Physical Exam .   GEN: No acute distress.   Neck: No JVD Cardiac: irregularly irregular, no murmurs, rubs, or gallops.  Respiratory: Clear to auscultation bilaterally. GI: Soft, nontender, non-distended  MS: No significant edema  Assessment & Plan .     Paroxysmal atrial fibrillation with RVR   Patient with hx difficult to manage/refractory afib. Hx multiple cardioversions and failed ablation attempt December 2023. Intolerant of Amiodarone. Patient developed afib with RVR this admission in the setting of acute on chronic hypoxic respiratory failure following missed HD.    Patient remains in afib this morning. Rates stable on telemetry in the 80-100 range. 100-130 with exertion. Continue Digoxin 0.125mg  Mon, Weds, Fri Patient now back on Eliquis 5mg  BID  following TDC removal by IR on 10/31. New TDC placement today.    HFrEF (EF 20-25%) Nonischemic cardiomyopathy   TTE this admission with LVEF ~30%, global hypokinesis. GDMT limited by chronic hypotension and ESRD.   Volume management per HD. Continue Midodrine for BP support Not a candidate for advanced therapies/ICD   Sepsis MSSA bacteremia Hypoxic respiratory failure   Patient developed sepsis with MSSA bacteremia this admission. There has been a question about TEE but given obesity, chronic hypoxic respiratory failure, hypotension requiring daily midodrine, TEE extremely high risk and deferred.   Abx/management per primary team   For questions or updates, please contact [facility] Please consult [email or web address] for contact info under        Signed, Perlie Gold, PA-C

## 2023-05-30 NOTE — Plan of Care (Signed)
  Problem: Education: Goal: Knowledge of General Education information will improve Description: Including pain rating scale, medication(s)/side effects and non-pharmacologic comfort measures Outcome: Progressing   Problem: Health Behavior/Discharge Planning: Goal: Ability to manage health-related needs will improve Outcome: Progressing   Problem: Clinical Measurements: Goal: Ability to maintain clinical measurements within normal limits will improve Outcome: Progressing Goal: Will remain free from infection Outcome: Progressing Goal: Diagnostic test results will improve Outcome: Progressing Goal: Respiratory complications will improve Outcome: Progressing Goal: Cardiovascular complication will be avoided Outcome: Progressing   Problem: Activity: Goal: Risk for activity intolerance will decrease Outcome: Progressing   Problem: Nutrition: Goal: Adequate nutrition will be maintained Outcome: Progressing   Problem: Coping: Goal: Level of anxiety will decrease Outcome: Progressing   Problem: Elimination: Goal: Will not experience complications related to bowel motility Outcome: Progressing Goal: Will not experience complications related to urinary retention Outcome: Progressing   Problem: Pain Management: Goal: General experience of comfort will improve Outcome: Progressing   Problem: Safety: Goal: Ability to remain free from injury will improve Outcome: Progressing   Problem: Skin Integrity: Goal: Risk for impaired skin integrity will decrease Outcome: Progressing   Problem: Education: Goal: Ability to demonstrate management of disease process will improve Outcome: Progressing Goal: Individualized Educational Video(s) Outcome: Progressing   Problem: Activity: Goal: Capacity to carry out activities will improve Outcome: Progressing   Problem: Cardiac: Goal: Ability to achieve and maintain adequate cardiopulmonary perfusion will improve Outcome: Progressing

## 2023-05-30 NOTE — Progress Notes (Signed)
Orthopedic Tech Progress Note Patient Details:  Phillip Young 02/03/1986 [id number]  Ortho Devices Type of Ortho Device: CAM walker Ortho Device/Splint Location: RLE Ortho Device/Splint Interventions: Application   Post Interventions Patient Tolerated: Well  Phillip Young 05/30/2023, 6:52 PM

## 2023-05-30 NOTE — Progress Notes (Signed)
ID Brief NOTE #MSSA bacteremia likely secondary to line infection/hospital associated  #ESRD on HD -Blood Cx 10/31 and 11/1 NG -TDC planned 11/4 -cefazolin with HD x 6 weeks EOT 12/11 -ID F/U with myself on 11/14 -ID will sign off

## 2023-05-30 NOTE — TOC Progression Note (Signed)
Transition of Care [facility]) - Progression Note    Patient Details  Name: Phillip Young MRN: [id number] Date of Birth: 08-01-1985  Transition of [facility] CM/SW Contact  Epifanio Lesches, RN Phone Number: 05/30/2023, 1:52 PM  Clinical Narrative:    Pt declining SNF placement. Pt readmitted to hospital. PTA active with [facility], [facility] declined pt for resumption of home health services 2/2 level of care needed.   Kelly with [facility] declined 2/2 services area.  [facility] declined 2/2 staffing. [facility] reviewing for acceptance, determination pending...  TOC team following for needs.   Expected Discharge Plan: Home w Home Health Services Barriers to Discharge: Continued Medical Work up  Expected Discharge Plan and Services       Living arrangements for the past 2 months: Apartment Expected Discharge Date: 05/24/23               DME Arranged: N/A DME Agency: NA       HH Arranged: PT, RN, Social Work [employer] Agency: [facility] Date HH Agency Contacted: 05/24/23 Time HH Agency Contacted: 1508 Representative spoke with at [facility] Agency: Elnita Maxwell   Social Determinants of Health (SDOH) Interventions SDOH Screenings   Food Insecurity: No Food Insecurity (05/18/2023)  Housing: Low Risk  (05/18/2023)  Transportation Needs: No Transportation Needs (05/18/2023)  Utilities: Not At Risk (05/18/2023)  Alcohol Screen: Low Risk  (09/21/2022)  Depression (PHQ2-9): High Risk (09/21/2022)  Financial Resource Strain: Low Risk  (03/24/2023)   Received from [facility]  Physical Activity: Inactive (09/21/2022)  Social Connections: Moderately Isolated (03/24/2023)   Received from [facility]  Stress: No Stress Concern Present (04/21/2023)   Received from [facility]  Tobacco Use: Medium Risk (05/18/2023)    Readmission Risk Interventions    05/24/2023    4:43 PM 03/23/2023    3:48 PM 06/09/2022   12:19 PM  Readmission Risk Prevention Plan   Transportation Screening Complete Complete Complete  PCP or Specialist Appt within 5-7 Days Complete    Home Care Screening Complete    Medication Review (RN CM) Referral to Pharmacy    Medication Review (RN Care Manager)  Complete Complete  PCP or Specialist appointment within 3-5 days of discharge  -- Complete  HRI or Home Care Consult  Complete Complete  SW Recovery Care/Counseling Consult  Complete Complete  Palliative Care Screening  Not Applicable Not Applicable  Skilled Nursing Facility  Not Applicable Not Applicable

## 2023-05-30 NOTE — Progress Notes (Signed)
Orthopedic Tech Progress Note Patient Details:  Rembert Browe 1986/02/19 [id number] Custom right AFO has been ordered from [facility]  Patient ID: [name], male   DOB: [birth year], 37 y.o.   MRN: [id number]  Smitty Pluck 05/30/2023, 2:18 PM

## 2023-05-30 NOTE — Progress Notes (Signed)
Physical Therapy Treatment Patient Details Name: Phillip Young MRN: [id number] DOB: [birth year] Today's Date: 05/30/2023   History of Present Illness 37 y.o. male presenting with BLE swelling and pain after missing HD appointments for 1 week due to transportation issues. Pt was going to be d/c on 10/29, however, pt w/ a-fib with RVR 2/2 sepsis with MSSA bacteria. Removal of HD catheter 10/31 due to infection with TCD catheter. Last admitted around 01/21/2023 - 03/23/23 with acute hypoxic respiratory failure. PMH PMH: morbid obesity, OSA on home BiPAP, obesity hypoventilation syndrome, chronic systolic heart failure due to nonischemic cardiomyopathy, dialysis dependent ESRD, and paroxysmal atrial fibrillation/flutter    PT Comments  Pt very willing and eager for participation with mobility, stating he knows he needs to get better and is working hard at it. Pt required less assistance with bed mobility and STS transfers, able to ambulate in 34ft bouts today, HR up to 147-162 when ambulating, pt would report needing to sit and would return back to sitting EOB for to return HR to 97-110. Patient will benefit from intensive inpatient follow up therapy, >3 hours/day     If plan is discharge home, recommend the following: Two people to help with walking and/or transfers;Two people to help with bathing/dressing/bathroom;Help with stairs or ramp for entrance;Assist for transportation;Assistance with cooking/housework   Can travel by private vehicle     No  Equipment Recommendations  Other (comment);Wheelchair (measurements PT);Wheelchair cushion (measurements PT)    Recommendations for Other Services       Precautions / Restrictions Precautions Precautions: Fall Restrictions Weight Bearing Restrictions: No     Mobility  Bed Mobility Overal bed mobility: Needs Assistance Bed Mobility: Supine to Sit, Rolling Rolling: Supervision   Supine to sit: HOB elevated, Used rails, Supervision      General bed mobility comments: Pt sat EOB using assist of bed rails, HOB elevated 30-40degrees.    Transfers Overall transfer level: Needs assistance Equipment used: Rolling walker (2 wheels) Transfers: Sit to/from Stand Sit to Stand: +2 safety/equipment, From elevated surface, Min assist           General transfer comment: Pt stood minA first trial fro elevated bed and use of RW, assistance provided to help facilitate getting trunk upright and engaging glutes for hip extension. Pt completed 4x STS CGA for safety and blocking RW. HR sitting 97, when standing spiked 147-162. Would settle back down to 110s after standing.    Ambulation/Gait Ambulation/Gait assistance: +2 safety/equipment, Contact guard assist Gait Distance (Feet): 5 Feet Assistive device: Rolling walker (2 wheels) Gait Pattern/deviations: Step-to pattern, Decreased dorsiflexion - right, Shuffle   Gait velocity interpretation: <1.31 ft/sec, indicative of household ambulator   General Gait Details: pt able to complete 4x 27ft abulation from bed and back. R step length limited by drop foot.   Stairs             Wheelchair Mobility     Tilt Bed    Modified Rankin (Stroke Patients Only)       Balance Overall balance assessment: Needs assistance Sitting-balance support: Feet supported, No upper extremity supported Sitting balance-Leahy Scale: Good Sitting balance - Comments: EOB with no UE support between all ambulation trials   Standing balance support: Bilateral upper extremity supported, During functional activity, Reliant on assistive device for balance Standing balance-Leahy Scale: Poor Standing balance comment: Forward lean onto RW during standing activities, able to propel forward and backward with ambulation  Cognition Arousal: Alert Behavior During Therapy: WFL for tasks assessed/performed Overall Cognitive Status: Within Functional Limits for  tasks assessed                                          Exercises      General Comments        Pertinent Vitals/Pain Pain Assessment Pain Assessment: No/denies pain    Home Living                          Prior Function            PT Goals (current goals can now be found in the care plan section) Acute Rehab PT Goals Patient Stated Goal: decreased pain, get better PT Goal Formulation: With patient Time For Goal Achievement: 06/09/23 Potential to Achieve Goals: Good Progress towards PT goals: Progressing toward goals    Frequency    Min 1X/week      PT Plan      Co-evaluation              AM-PAC PT "6 Clicks" Mobility   Outcome Measure  Help needed turning from your back to your side while in a flat bed without using bedrails?: None Help needed moving from lying on your back to sitting on the side of a flat bed without using bedrails?: A Little Help needed moving to and from a bed to a chair (including a wheelchair)?: A Lot Help needed standing up from a chair using your arms (e.g., wheelchair or bedside chair)?: A Lot Help needed to walk in hospital room?: A Lot Help needed climbing 3-5 steps with a railing? : Total 6 Click Score: 14    End of Session Equipment Utilized During Treatment: Gait belt;Oxygen Activity Tolerance: Patient tolerated treatment well Patient left: in bed;with call bell/phone within reach Nurse Communication: Mobility status PT Visit Diagnosis: Muscle weakness (generalized) (M62.81);Other abnormalities of gait and mobility (R26.89);Difficulty in walking, not elsewhere classified (R26.2)     Time: 7616-0737 PT Time Calculation (min) (ACUTE ONLY): 33 min  Charges:    $Gait Training: 8-22 mins $Therapeutic Activity: 8-22 mins PT General Charges $$ ACUTE PT VISIT: 1 Visit                     Andrey Farmer. SPT Secure chat preferred    Darlin Drop 05/30/2023, 2:08 PM

## 2023-05-30 NOTE — Progress Notes (Signed)
Phillip Young Progress Note   Subjective:    Pt seen in room. No c/o's. Stable.   Objective Vitals:   05/29/23 1206 05/29/23 1402 05/29/23 1709 05/29/23 2103  BP: (!) 100/55 122/74 124/77 115/74  Pulse: 78  67 60  Resp: 17  16 19   Temp: 98.6 F (37 C)  98.1 F (36.7 C) 97.9 F (36.6 C)  TempSrc: Oral  Oral Oral  SpO2: 96%  100%   Weight:      Height:       Physical Exam General: Chronically ill-appearing, Obese. NAD. Nasal O2 in place (5L is baseline) Heart: Irregular, no murmurs, gallops, or rubs Lungs: Clear anteriorly Abdomen: soft, non-tender Extremities: No LE edema Dialysis Access: TDC in R chest  Filed Weights   05/23/23 1356 05/28/23 0441 05/29/23 0531  Weight: (!) 161.3 kg (!) 162.9 kg (!) 159.6 kg    Dialysis Orders: MWF at [facility] 4.5h   400/800    172kg   2K/2Ca bath   TDC   No heparin (+ HIT)  Assessment/Plan: # Acute Hypoxic RF: In setting of missed HD due to transportation issues. Resolved. 12kg under dry wt.   # MSSA Bacteremia: occurred in the hospital. IV Vanc and Cefepime. IR consulted. TDC removed by IR 10/31, is now on line holiday. Plan for new [facility] placement on 11/4 per IR>   # Paroxysmal A-fib RVR: Cardiology on board. On Eliquis and Digoxin. Not a candidate for Amiodarone 2nd toxicity and elevated LFTs. Not a candidate for ablation or pacemaker 2nd infection risk.  # ESRD: Back on MWF. Next HD today after [facility] placement.   # +HIT - no heparin  # HypoTN/volume: BP stable on high dose midodrine (30mg  TID), now under EDW, will need EDW lowered significantly  # Anemia: Hgb 9.1 - will raise Aranesp q Sat while here.  # Secondary hyperparathyroidism: CorrCa high, Phos okay - continue lanthanum binders. No VDRA. Check PTH.  # Nutrition: Alb low, continue supps.  # HFrEF: On digoxin + Eliquis.  # Obesity  Phillip Moselle  MD  [facility] 05/30/2023, 7:29 AM  Recent Labs  Lab 05/24/23 1914 05/25/23 0446 05/27/23 0636  05/28/23 0342 05/29/23 0405 05/30/23 0448  HGB 9.6*   < > 9.2* 9.6*  --   --   ALBUMIN 3.0*  --  2.5*  --   --   --   CALCIUM 10.4*   < > 9.4 9.6 9.8 10.0  PHOS 3.1   < > 4.5  --  4.6  --   CREATININE 7.74*   < > 7.64* 8.79* 10.54* 11.66*  K 4.5   < > 4.1 4.3 4.4 4.8   < > = values in this interval not displayed.    Inpatient medications:  apixaban  5 mg Oral BID   Chlorhexidine Gluconate Cloth  6 each Topical Q0600   darbepoetin (ARANESP) injection - DIALYSIS  150 mcg Subcutaneous Q Sat-1800   digoxin  0.125 mg Oral Once per day on Monday Wednesday Friday   feeding supplement (NEPRO CARB STEADY)  237 mL Oral BID BM   gabapentin  300 mg Oral Q M,W,F-HD   Gerhardt's butt cream   Topical TID   lanthanum  2,000 mg Oral TID WC   leptospermum manuka honey  1 Application Topical Daily   levothyroxine  25 mcg Oral Q0600   lidocaine  10 mL Infiltration Once   midodrine  30 mg Oral TID WC   urea   Topical  Daily     ceFAZolin (ANCEF) IV 1 g (05/29/23 1000)   acetaminophen **OR** acetaminophen, docusate sodium, ondansetron (ZOFRAN) IV, oxyCODONE, polyethylene glycol

## 2023-05-30 NOTE — Progress Notes (Signed)
Received patient in bed.Awake,alert and oriented x 4. Consent verified.Oxygen at 2 lpm/[place].  Access used : Right HD catheter that worked well. Dressing on date.  Duration of treatment   4 hours   UF goal  1.8 L  Hemo comment:: Midway to his treatment,he felt some cramping and quit on his treatment thus 1.8 l of net UF from him.  Hand off to the patient's nurse with stable medical condition via transporter.

## 2023-05-30 NOTE — Sedation Documentation (Signed)
Antibiotics reviewed with HENN MD. 1g Ancef ordered as pre procedure medication.

## 2023-05-30 NOTE — Progress Notes (Signed)
HD report given at bedside to HD RN. Pt refused midodrine for this RN. HD RN made aware.

## 2023-05-30 NOTE — Progress Notes (Signed)
After implementing wound care on pt, pt requested the tweezers from the surgical kit. Pt stated, "I need those to clean my finger nails with and to pick my nose". This RN refused to give the pt the tweezers & the pt removed a pair from his belongings at the bedside & proceeded to clean his nails. Pt educated on risk of infection & possible harm that the soiled utensils could potentially cause.  Bari Edward, RN

## 2023-05-30 NOTE — Procedures (Signed)
Interventional Radiology Procedure:   Indications: ESRD and needs new dialysis catheter  Procedure: Placement of tunneled dialysis catheter  Findings: Right jugular Palindrome (23 cm), tip at SVC/RA junction  Complications: None     EBL: Minimal  Plan: Catheter is ready to use.   Aiya Keach R. Lowella Dandy, MD  Pager: [phone]

## 2023-05-30 NOTE — Progress Notes (Signed)
PT Cancellation Note  Patient Details Name: Phillip Young MRN: [id number] DOB: 1985-10-31   Cancelled Treatment:    Reason Eval/Treat Not Completed: Patient at procedure or test/unavailable (Pt getting temporary HD cath, will check back at later time)  Andrey Farmer. SPT Secure chat preferred  Darlin Drop 05/30/2023, 9:29 AM

## 2023-05-30 NOTE — Progress Notes (Signed)
HD#6 SUBJECTIVE:  Patient Summary: Phillip Young is a 37 y.o. man with a pertinent PMH of ESRD on MWF HD, HFrEF, chronic hypoxic respiratory failure on 5L [place], Afib on Eliquis who presented with shortness of breath that progressed to fevers and chills and is now admitted for Sepsis / MSSA bacteremia.   Overnight Events: None  Interim History: He states that his neck is stiff after he had the procedure just now. He states that he was on the oxygen and he was taken off of some time and is back on in this morning. He does not report any shortness of breath. He states that make sure that everything is okay before going home. He reports that he does not want to go to SNF.   OBJECTIVE:  Vital Signs: Vitals:   05/30/23 0910 05/30/23 0915 05/30/23 0930 05/30/23 1015  BP: 119/69 119/69 (!) 119/54   Pulse:   95 90  Resp: 19 18 (!) 23   Temp:      TempSrc:      SpO2: 95% 93% 95%   Weight:      Height:       Supplemental O2: Room Air SpO2: 95 % O2 Flow Rate (L/min): 4 L/min  Filed Weights   05/23/23 1356 05/28/23 0441 05/29/23 0531  Weight: (!) 161.3 kg (!) 162.9 kg (!) 159.6 kg     Intake/Output Summary (Last 24 hours) at 05/30/2023 1144 Last data filed at 05/30/2023 1028 Gross per 24 hour  Intake 273.33 ml  Output 50 ml  Net 223.33 ml   Net IO Since Admission: -13,912.04 mL [05/30/23 1144]  Physical Exam: Physical Exam Constitutional:      General: He is not in acute distress.    Appearance: Normal appearance. He is obese. He is not ill-appearing.  HENT:     Mouth/Throat:     Mouth: Mucous membranes are moist.  Cardiovascular:     Rate and Rhythm: Normal rate. Rhythm irregular.     Pulses: Normal pulses.  Pulmonary:     Effort: Pulmonary effort is normal.     Breath sounds: Normal breath sounds.  Abdominal:     General: Abdomen is flat. Bowel sounds are normal.     Palpations: Abdomen is soft.     Tenderness: There is no abdominal tenderness.  Musculoskeletal:         General: Tenderness present.     Right lower leg: No edema.     Left lower leg: No edema.     Comments: Stable tenderness to B/L LE re neuropathy  Skin:    General: Skin is warm and dry.     Capillary Refill: Capillary refill takes less than 2 seconds.  Neurological:     General: No focal deficit present.     Mental Status: He is alert and oriented to person, place, and time.  Psychiatric:        Mood and Affect: Mood normal.        Behavior: Behavior normal.     Patient Lines/Drains/Airways Status     Active Line/Drains/Airways     Name Placement date Placement time Site Days   Peripheral IV 05/29/23 20 G 1" Anterior;Distal;Left Forearm 05/29/23  1148  Forearm  1   Hemodialysis Catheter Right Internal jugular Double lumen Permanent (Tunneled) 05/30/23  0856  Internal jugular  less than 1   Pressure Injury 02/03/23 Coccyx Mid Stage 2 -  Partial thickness loss of dermis presenting as a shallow open injury with  a red, pink wound bed without slough. 02/03/23  0800  -- 116   Pressure Injury 05/18/23 Buttocks Right Stage 3 -  Full thickness tissue loss. Subcutaneous fat may be visible but bone, tendon or muscle are NOT exposed. 05/18/23  2300  -- 12   Pressure Injury 05/18/23 Buttocks Left Stage 2 -  Partial thickness loss of dermis presenting as a shallow open injury with a red, pink wound bed without slough. 05/18/23  2300  -- 12             ASSESSMENT/PLAN:  Assessment: Principal Problem:   Sepsis ([name]) Active Problems:   ESRD (end stage renal disease) on dialysis ([name])   Congestive heart failure ([name])   Acute on chronic hypoxic respiratory failure ([name])   Atrial fibrillation ([name])   Pressure injury of skin   MSSA bacteremia   Neuropathy   Plan: Sepsis MSSA bacteremia Remains afebrile since starting IV antibiotics.  HD catheter removed Friday, line holiday ending today and replaced this morning, 11/4. HD today at 12 noon. TTE without obvious vegetations, TEE would be  best for ruling out endocarditis but risks outweigh benefits for this person. Thus, we will have 6 weeks of IV antibiotic therapy from negative cultures on 05/26/2023. - Continue cefazolin - Map goal > 65. fluid boluses if consistently below.  - Anticipate discharge home today after HD.   Pressure injury of skin L buttocks subsequent to 2 month hospitalization this summer. No signs of infection, healing well. Continue good wound care.   Debility Immobility, R foot drop Stable, but a barrier to dialysis OP. PT/OT has been working with him. We will get this man a boot for the R foot. Would benefit from OP physical therapy.    ESRD HD catheter holiday over the weekend ends today. Now MWF.  Volume status, electrolytes, and bicarb are stable.   Chronic HFrEF LVEF 30 to 35%.  Well compensated.  Continue digoxin 0.125 mg p.o. 3 times weekly   Persistent atrial fibrillation Rate controlled at present.  Amiodarone poor choice given young age and risk to lungs.  Ablation risks outweigh benefits.  Continue anticoagulation with apixaban 5 mg p.o. twice daily.   Chronic hypoxic respiratory failure Stable supplemental O2 requirement. 5L is baseline. Tolerates much less when at rest.   Hypotension Chronic, doing okay on midodrine 30 mg 3 times daily AC. Can do boluses if consistently low maps.   Neuropathy Unfortunately has pretty bad neuropathy and foot drop after recent, prolonged hospitalization.  Continue gabapentin, urea and lidocaine lotions.   Best Practice: Diet: Renal diet with 1200 mL fluid restriction IVF: none VTE: Place TED hose Start: 05/22/23 1313 Code: Full AB: Ancef Therapy Recs: SNF DISPO: Anticipated discharge  today to Home pending dialysis .  Signature: [name], D.O.  Internal Medicine Resident, PGY-1 [facility]  Pager: [phone] 11:44 AM, 05/30/2023   Please contact the on call pager after 5 pm and on weekends  at [phone].

## 2023-05-31 LAB — RENAL FUNCTION PANEL
Albumin: 2.7 g/dL — ABNORMAL LOW (ref 3.5–5.0)
Anion gap: 13 (ref 5–15)
BUN: 35 mg/dL — ABNORMAL HIGH (ref 6–20)
CO2: 25 mmol/L (ref 22–32)
Calcium: 10 mg/dL (ref 8.9–10.3)
Chloride: 97 mmol/L — ABNORMAL LOW (ref 98–111)
Creatinine, Ser: 9.83 mg/dL — ABNORMAL HIGH (ref 0.61–1.24)
GFR, Estimated: 6 mL/min — ABNORMAL LOW (ref >60)
Glucose, Bld: 80 mg/dL (ref 70–99)
Phosphorus: 4.9 mg/dL — ABNORMAL HIGH (ref 2.5–4.6)
Potassium: 4.5 mmol/L (ref 3.5–5.1)
Sodium: 135 mmol/L (ref 135–145)

## 2023-05-31 LAB — CULTURE, BLOOD (ROUTINE X 2)
Culture: NO GROWTH
Culture: NO GROWTH
Special Requests: ADEQUATE
Special Requests: ADEQUATE

## 2023-05-31 MED ORDER — GABAPENTIN 300 MG PO CAPS
300.0000 mg | ORAL_CAPSULE | Freq: Once | ORAL | Status: AC
  Administered 2023-05-31: 300 mg via ORAL
  Filled 2023-05-31: qty 1

## 2023-05-31 MED ORDER — CHLORHEXIDINE GLUCONATE CLOTH 2 % EX PADS
6.0000 | MEDICATED_PAD | Freq: Every day | CUTANEOUS | Status: DC
  Administered 2023-05-31 – 2023-06-02 (×3): 6 via TOPICAL

## 2023-05-31 NOTE — Plan of Care (Signed)
  Problem: Health Behavior/Discharge Planning: Goal: Ability to manage health-related needs will improve Outcome: Progressing   Problem: Clinical Measurements: Goal: Ability to maintain clinical measurements within normal limits will improve Outcome: Progressing Goal: Respiratory complications will improve Outcome: Progressing Goal: Cardiovascular complication will be avoided Outcome: Progressing   Problem: Activity: Goal: Risk for activity intolerance will decrease Outcome: Progressing   Problem: Nutrition: Goal: Adequate nutrition will be maintained Outcome: Progressing   Problem: Pain Management: Goal: General experience of comfort will improve Outcome: Progressing   Problem: Safety: Goal: Ability to remain free from injury will improve Outcome: Progressing   Problem: Activity: Goal: Capacity to carry out activities will improve Outcome: Progressing   Problem: Cardiac: Goal: Ability to achieve and maintain adequate cardiopulmonary perfusion will improve Outcome: Progressing

## 2023-05-31 NOTE — Progress Notes (Addendum)
HD#7 SUBJECTIVE:  Patient Summary: Phillip Young is a 37 y.o. man with a pertinent PMH of ESRD on MWF HD, HFrEF, chronic hypoxic respiratory failure on 5L [place], Afib on Eliquis who presented with shortness of breath that progressed to fevers and chills and is now admitted for Sepsis / MSSA bacteremia.   Overnight Events: None  Interim History: Neck pain this morning. Feet burning. Was able to walk yesterday. Now has a boot.   OBJECTIVE:  Vital Signs: Vitals:   05/30/23 2009 05/30/23 2257 05/31/23 0407 05/31/23 0500  BP: 100/78 130/77 (!) 116/92   Pulse: 81 80 100   Resp: 18 20 20    Temp: 98.5 F (36.9 C) 98.2 F (36.8 C) 98.2 F (36.8 C)   TempSrc: Oral Oral Oral   SpO2: 100%  94%   Weight:    (!) 163.9 kg  Height:       Supplemental O2: Nasal Cannula SpO2: 94 % O2 Flow Rate (L/min): 4 L/min  Filed Weights   05/30/23 1256 05/30/23 1457 05/31/23 0500  Weight: (!) 160.5 kg (!) 159.5 kg (!) 163.9 kg     Intake/Output Summary (Last 24 hours) at 05/31/2023 0953 Last data filed at 05/31/2023 0330 Gross per 24 hour  Intake 803.33 ml  Output 100 ml  Net 703.33 ml   Net IO Since Admission: -13,482.04 mL [05/31/23 0953]  Physical Exam: Physical Exam Constitutional:      General: He is not in acute distress.    Appearance: Normal appearance. He is obese. He is not ill-appearing.  Cardiovascular:     Rate and Rhythm: Normal rate. Rhythm irregular.     Pulses: Normal pulses.  Pulmonary:     Effort: Pulmonary effort is normal.     Breath sounds: Normal breath sounds. No wheezing or rales.  Abdominal:     General: Abdomen is flat.     Palpations: Abdomen is soft.     Tenderness: There is no abdominal tenderness.  Musculoskeletal:        General: Tenderness present.     Right lower leg: No edema.     Left lower leg: No edema.     Comments: Stable chronic LE tenderness  Skin:    General: Skin is warm and dry.     Capillary Refill: Capillary refill takes less than 2  seconds.  Neurological:     General: No focal deficit present.     Mental Status: He is alert and oriented to person, place, and time.  Psychiatric:        Mood and Affect: Mood normal.        Behavior: Behavior normal.     Patient Lines/Drains/Airways Status     Active Line/Drains/Airways     Name Placement date Placement time Site Days   Peripheral IV 05/29/23 20 G 1" Anterior;Distal;Left Forearm 05/29/23  1148  Forearm  2   Hemodialysis Catheter Right Internal jugular Double lumen Permanent (Tunneled) 05/30/23  0856  Internal jugular  1   Pressure Injury 02/03/23 Coccyx Mid Stage 2 -  Partial thickness loss of dermis presenting as a shallow open injury with a red, pink wound bed without slough. 02/03/23  0800  -- 117   Pressure Injury 05/18/23 Buttocks Right Stage 3 -  Full thickness tissue loss. Subcutaneous fat may be visible but bone, tendon or muscle are NOT exposed. 05/18/23  2300  -- 13   Pressure Injury 05/18/23 Buttocks Left Stage 2 -  Partial thickness loss of dermis presenting  as a shallow open injury with a red, pink wound bed without slough. 05/18/23  2300  -- 13             ASSESSMENT/PLAN:  Assessment: Principal Problem:   Sepsis ([name]) Active Problems:   ESRD (end stage renal disease) on dialysis ([name])   Congestive heart failure ([name])   Acute on chronic hypoxic respiratory failure ([name])   Atrial fibrillation ([name])   Pressure injury of skin   MSSA bacteremia   Neuropathy   Plan: Sepsis MSSA bacteremia Remains afebrile since starting IV antibiotics.  HD holiday completed, now resumed HD on MWF. TTE without obvious vegetations, TEE would be best for ruling out endocarditis but risks outweigh benefits for this person. Thus, we will have 6 weeks of IV antibiotic therapy from negative cultures on 05/26/2023 to end 07/06/23. - Continue cefazolin - Map goal > 65. fluid boluses if consistently below.  - Anticipate discharge home today unless CIR inpatient  rehab is an option.   Pressure injury of skin L buttocks subsequent to 2 month hospitalization this summer. No signs of infection, healing well. Continue good wound care.   Debility Immobility, R foot drop Neuropathy Stable, but a barrier to dialysis OP. PT/OT has been working with him. We got this man a brace for the R foot. Would benefit from OP physical therapy. Neuropathy and foot drop after recent, prolonged hospitalization.  Continue gabapentin, urea and lidocaine lotions. Consider Cymbalta outpatient.   ESRD HD catheter holiday over the weekend, [facility] replaced 11/4. Now MWF.  Volume status, electrolytes, and bicarb are stable.   Stable Chronic Conditions  Chronic HFrEF LVEF 30 to 35%.  Well compensated.  Continue digoxin 0.125 mg p.o. 3 times weekly   Persistent atrial fibrillation Rate controlled at present.  Amiodarone poor choice given young age and risk to lungs.  Ablation risks outweigh benefits.  Continue anticoagulation with apixaban 5 mg p.o. twice daily.   Chronic hypoxic respiratory failure Stable supplemental O2 requirement. 5L is baseline. Tolerates much less when at rest.   Hypotension Chronic, doing okay on midodrine 30 mg 3 times daily AC. Can do boluses if consistently low maps.  Best Practice: Diet: Renal diet with 1200 mL fluid restriction IVF: none VTE: Place TED hose Start: 05/22/23 1313 Code: Full AB: Ancef Therapy Recs: SNF DISPO: Anticipated discharge  today to Home.  Signature: [name], D.O.  Internal Medicine Resident, PGY-1 [facility]  Pager: [phone] 9:53 AM, 05/31/2023   Please contact the on call pager after 5 pm and on weekends at [phone].

## 2023-05-31 NOTE — TOC Progression Note (Addendum)
Transition of Care [facility]) - Progression Note    Patient Details  Name: Phillip Young MRN: [id number] Date of Birth: 03-11-1986  Transition of Care [facility]) CM/SW Contact  Lawerance Sabal, RN Phone Number: 05/31/2023, 9:56 AM  Clinical Narrative:     Followed up a [facility] and they are unable to accept.  Referral made to [facility], they are unable to accept Care Team aware that patient will not have HH services.   Spoke w patient in the room, reviewed with him that he and his mother will be responsible for wound care until wound center follow up on Dec 5th.   Patient confirms that he will stay with his mother at [address] [place] [address].  He states that the hospital bed, hoyer, and oxygen are all set up at his mom's apartment. His home oxygen is through [employer]  Patient will need PTAR for transport. Forms printed and placed on chart   Spoke w patient and he states that he can walk with his boot and RW to get from the apartment into [facility].    Expected Discharge Plan: Home w Home Health Services Barriers to Discharge: Continued Medical Work up  Expected Discharge Plan and Services       Living arrangements for the past 2 months: Apartment Expected Discharge Date: 05/24/23               DME Arranged: N/A DME Agency: NA       HH Arranged: PT, RN, Social Work [employer] Agency: [facility] Date HH Agency Contacted: 05/24/23 Time HH Agency Contacted: 1508 Representative spoke with at [facility] Agency: Elnita Maxwell   Social Determinants of Health (SDOH) Interventions SDOH Screenings   Food Insecurity: No Food Insecurity (05/18/2023)  Housing: Low Risk  (05/18/2023)  Transportation Needs: No Transportation Needs (05/18/2023)  Utilities: Not At Risk (05/18/2023)  Alcohol Screen: Low Risk  (09/21/2022)  Depression (PHQ2-9): High Risk (09/21/2022)  Financial Resource Strain: Low Risk  (03/24/2023)   Received from [facility]  Physical Activity:  Inactive (09/21/2022)  Social Connections: Moderately Isolated (03/24/2023)   Received from [facility]  Stress: No Stress Concern Present (04/21/2023)   Received from [facility]  Tobacco Use: Medium Risk (05/18/2023)    Readmission Risk Interventions    05/24/2023    4:43 PM 03/23/2023    3:48 PM 06/09/2022   12:19 PM  Readmission Risk Prevention Plan  Transportation Screening Complete Complete Complete  PCP or Specialist Appt within 5-7 Days Complete    Home Care Screening Complete    Medication Review (RN CM) Referral to Pharmacy    Medication Review (RN Care Manager)  Complete Complete  PCP or Specialist appointment within 3-5 days of discharge  -- Complete  HRI or Home Care Consult  Complete Complete  SW Recovery Care/Counseling Consult  Complete Complete  Palliative Care Screening  Not Applicable Not Applicable  Skilled Nursing Facility  Not Applicable Not Applicable

## 2023-05-31 NOTE — Progress Notes (Signed)
Contacted [facility]) to confirm pt's schedule. Pt goes on MWF with 11:35 am arrival time for a 11:55 am chair time. Pt should be ready for pick-up at 4:40 pm.Clinic social worker reports pt being set up with [facility] but transportation unable to do door to door transport. This information provided to attending and case management staff for d/c planning purposes. Clinic advised pt will need iv cefazolin at d/c and confirms clinic has med available. Will assist as needed.   Olivia Canter Renal Navigator [phone]

## 2023-05-31 NOTE — Progress Notes (Signed)
[facility] Progress Note   Subjective:   Patient reports neck is sore from [facility] insertion and voice is hoarse from intubation. Reports he had a lot of nausea this AM and took zofran. Also reports ongoing neuropathy and says he was dizzy after ambulating yesterday. Denies SOB, CP, dizziness at present.   Objective Vitals:   05/30/23 2009 05/30/23 2257 05/31/23 0407 05/31/23 0500  BP: 100/78 130/77 (!) 116/92   Pulse: 81 80 100   Resp: 18 20 20    Temp: 98.5 F (36.9 C) 98.2 F (36.8 C) 98.2 F (36.8 C)   TempSrc: Oral Oral Oral   SpO2: 100%  94%   Weight:    (!) 163.9 kg  Height:       Physical Exam General: Alert male in NAD Heart: RRR, no murmurs, rubs or gallops Lungs: CTA anteriorly, respirations unlabored, voice is hoarse Abdomen: Soft, non-distended, +BS Extremities: No LE edema Dialysis Access:  TDC in chest with intact bandage, no bleeding/redness  Additional Objective Labs: Basic Metabolic Panel: Recent Labs  Lab 05/27/23 0636 05/28/23 0342 05/29/23 0405 05/30/23 0448 05/31/23 0659  NA 132*   < > 135 135 135  K 4.1   < > 4.4 4.8 4.5  CL 96*   < > 94* 97* 97*  CO2 25   < > 25 25 25   GLUCOSE 83   < > 90 87 80  BUN 31*   < > 46* 48* 35*  CREATININE 7.64*   < > 10.54* 11.66* 9.83*  CALCIUM 9.4   < > 9.8 10.0 10.0  PHOS 4.5  --  4.6  --  4.9*   < > = values in this interval not displayed.   Liver Function Tests: Recent Labs  Lab 05/24/23 1914 05/27/23 0636 05/31/23 0659  ALBUMIN 3.0* 2.5* 2.7*   No results for input(s): "LIPASE", "AMYLASE" in the last 168 hours. CBC: Recent Labs  Lab 05/24/23 1914 05/25/23 0446 05/26/23 0633 05/27/23 0636 05/28/23 0342  WBC 10.6* 19.3* 9.3 6.5 5.6  NEUTROABS 9.7* 18.2*  --  3.9  --   HGB 9.6* 8.6* 9.1* 9.2* 9.6*  HCT 31.3* 27.7* 29.2* 28.9* 30.7*  MCV 100.6* 100.0 100.7* 98.3 99.4  PLT 240 200 182 172 204   Blood Culture    Component Value Date/Time   SDES BLOOD BLOOD RIGHT HAND 05/27/2023  0959   SPECREQUEST  05/27/2023 0959    BOTTLES DRAWN AEROBIC AND ANAEROBIC Blood Culture adequate volume   CULT  05/27/2023 0959    NO GROWTH 3 DAYS Performed at [facility], [address] [place], [place] [address]    REPTSTATUS PENDING 05/27/2023 6045    Cardiac Enzymes: No results for input(s): "CKTOTAL", "CKMB", "CKMBINDEX", "TROPONINI" in the last 168 hours. CBG: No results for input(s): "GLUCAP" in the last 168 hours. Iron Studies: No results for input(s): "IRON", "TIBC", "TRANSFERRIN", "FERRITIN" in the last 72 hours. @lablastinr3 @ Studies/Results: IR Fluoro Guide CV Line Right  Addendum Date: 05/30/2023   ADDENDUM REPORT: 05/30/2023 17:52 ADDENDUM: Correction: Heparin was NOT placed in the dialysis lumens due to patient's allergy. Appropriate amount of sodium citrate was placed in both dialysis lumens. Electronically Signed   By: Richarda Overlie M.D.   On: 05/30/2023 17:52   Result Date: 05/30/2023 INDICATION: End-stage renal disease and needs a new tunneled dialysis catheter after a line holiday. EXAM: FLUOROSCOPIC AND ULTRASOUND GUIDED PLACEMENT OF A TUNNELED DIALYSIS CATHETER Physician: Rachelle Hora. Lowella Dandy, MD MEDICATIONS: Ancef 1 g;  The antibiotic was administered within an appropriate time interval prior to skin puncture. Zofran 4 mg ANESTHESIA/SEDATION: Moderate (conscious) sedation was employed during this procedure. A total of Versed 2mg  and fentanyl 200 mcg was administered intravenously at the order of the provider performing the procedure. Total intra-service moderate sedation time: 35 minutes. Patient's level of consciousness and vital signs were monitored continuously by radiology nurse throughout the procedure under the supervision of the provider performing the procedure. FLUOROSCOPY TIME:  Radiation Exposure Index (as provided by the fluoroscopic device): 41 mGy Kerma CONTRAST:  2 mL Omnipaque 300 COMPLICATIONS: None immediate. PROCEDURE: The procedure was explained to the  patient. The risks and benefits of the procedure were discussed and the patient's questions were addressed. Informed consent was obtained from the patient. The patient was placed supine on the interventional table. Ultrasound confirmed a patent right internal jugular vein. Ultrasound image obtained for documentation. The right neck and chest was prepped and draped in a sterile fashion. Maximal barrier sterile technique was utilized including caps, mask, sterile gowns, sterile gloves, sterile drape, hand hygiene and skin antiseptic. The right neck was anesthetized with 1% lidocaine. A small incision was made with #11 blade scalpel. A 21 gauge needle directed into the right internal jugular vein with ultrasound guidance. Initially, wire would not advance centrally. Contrast injection confirmed a patent right internal jugular vein and right innominate vein. Micropuncture catheter was advanced into the central veins using a Glidewire. A 23 cm tip to cuff Palindrome catheter was selected. The skin below the right clavicle was anesthetized and a small incision was made with an #11 blade scalpel. A subcutaneous tunnel was formed to the vein dermatotomy site. The catheter was brought through the tunnel. The vein dermatotomy site was dilated to accommodate a peel-away sheath. The catheter was placed through the peel-away sheath and directed into the central venous structures. The tip of the catheter was placed at superior cavoatrial junction with fluoroscopy. Fluoroscopic images were obtained for documentation. Both lumens were found to aspirate and flush well. The proper amount of heparin was flushed in both lumens. The vein dermatotomy site was closed using a single layer of absorbable suture and Dermabond. The catheter was secured to the skin using Prolene suture. IMPRESSION: Successful placement of a right jugular tunneled dialysis catheter using ultrasound and fluoroscopic guidance. Catheter tip at the superior  cavoatrial junction. Electronically Signed: By: Richarda Overlie M.D. On: 05/30/2023 12:29   IR US Guide Vasc Access Right  Addendum Date: 05/30/2023   ADDENDUM REPORT: 05/30/2023 17:52 ADDENDUM: Correction: Heparin was NOT placed in the dialysis lumens due to patient's allergy. Appropriate amount of sodium citrate was placed in both dialysis lumens. Electronically Signed   By: Richarda Overlie M.D.   On: 05/30/2023 17:52   Result Date: 05/30/2023 INDICATION: End-stage renal disease and needs a new tunneled dialysis catheter after a line holiday. EXAM: FLUOROSCOPIC AND ULTRASOUND GUIDED PLACEMENT OF A TUNNELED DIALYSIS CATHETER Physician: Rachelle Hora. Lowella Dandy, MD MEDICATIONS: Ancef 1 g; The antibiotic was administered within an appropriate time interval prior to skin puncture. Zofran 4 mg ANESTHESIA/SEDATION: Moderate (conscious) sedation was employed during this procedure. A total of Versed 2mg  and fentanyl 200 mcg was administered intravenously at the order of the provider performing the procedure. Total intra-service moderate sedation time: 35 minutes. Patient's level of consciousness and vital signs were monitored continuously by radiology nurse throughout the procedure under the supervision of the provider performing the procedure. FLUOROSCOPY TIME:  Radiation Exposure Index (as provided  by the fluoroscopic device): 41 mGy Kerma CONTRAST:  2 mL Omnipaque 300 COMPLICATIONS: None immediate. PROCEDURE: The procedure was explained to the patient. The risks and benefits of the procedure were discussed and the patient's questions were addressed. Informed consent was obtained from the patient. The patient was placed supine on the interventional table. Ultrasound confirmed a patent right internal jugular vein. Ultrasound image obtained for documentation. The right neck and chest was prepped and draped in a sterile fashion. Maximal barrier sterile technique was utilized including caps, mask, sterile gowns, sterile gloves, sterile  drape, hand hygiene and skin antiseptic. The right neck was anesthetized with 1% lidocaine. A small incision was made with #11 blade scalpel. A 21 gauge needle directed into the right internal jugular vein with ultrasound guidance. Initially, wire would not advance centrally. Contrast injection confirmed a patent right internal jugular vein and right innominate vein. Micropuncture catheter was advanced into the central veins using a Glidewire. A 23 cm tip to cuff Palindrome catheter was selected. The skin below the right clavicle was anesthetized and a small incision was made with an #11 blade scalpel. A subcutaneous tunnel was formed to the vein dermatotomy site. The catheter was brought through the tunnel. The vein dermatotomy site was dilated to accommodate a peel-away sheath. The catheter was placed through the peel-away sheath and directed into the central venous structures. The tip of the catheter was placed at superior cavoatrial junction with fluoroscopy. Fluoroscopic images were obtained for documentation. Both lumens were found to aspirate and flush well. The proper amount of heparin was flushed in both lumens. The vein dermatotomy site was closed using a single layer of absorbable suture and Dermabond. The catheter was secured to the skin using Prolene suture. IMPRESSION: Successful placement of a right jugular tunneled dialysis catheter using ultrasound and fluoroscopic guidance. Catheter tip at the superior cavoatrial junction. Electronically Signed: By: Richarda Overlie M.D. On: 05/30/2023 12:29   Medications:  [START ON 06/01/2023]  ceFAZolin (ANCEF) IV      apixaban  5 mg Oral BID   Chlorhexidine Gluconate Cloth  6 each Topical Q0600   darbepoetin (ARANESP) injection - DIALYSIS  150 mcg Subcutaneous Q Sat-1800   digoxin  0.125 mg Oral Once per day on Monday Wednesday Friday   feeding supplement (NEPRO CARB STEADY)  237 mL Oral BID BM   gabapentin  300 mg Oral Q M,W,F-HD   Gerhardt's butt cream    Topical TID   lanthanum  2,000 mg Oral TID WC   leptospermum manuka honey  1 Application Topical Daily   levothyroxine  25 mcg Oral Q0600   midodrine  30 mg Oral TID WC   urea   Topical Daily    Dialysis Orders: MWF at [facility] 4.5h   400/800    172kg   2K/2Ca bath   TDC   No heparin (+ HIT)  Assessment/Plan: 1. Acute hypoxic respiratory failure: Missed HD due to transportation issues. Now resolved, will need lower EDW at discharge 2. MSSA Bacteremia: [facility] consulted and underwent line holiday 05/26/23-05/30/23: On ancef.  2. ESRD: S/p line holiday as above, now back on schedule. Continue MWF schedule, next HD tomorrow. HIT+, using sodium citrate for cath locks 3. HTN/volume:  On high dose midodrine 30mg  TID. BP appears to be stable. Volume status is improved. Cramped with HD yesterday, reducing UF goals.  4. Anemia: Hgb 9.1, aranesp dose increased to q Saturday 5. Secondary hyperparathyroidism:  Calcium high, hold VDRA. Phos is at goal.  Continue lanthanum. 6. Nutrition:  Continue renal diet and protein supplements  Rogers Blocker, PA-C 05/31/2023, 10:21 AM  [facility] Pager: [phone]

## 2023-05-31 NOTE — Progress Notes (Addendum)
Inpatient Rehab Coordinator Note:  I met with patient at bedside (mom on speaker phone) to discuss CIR recommendations and goals/expectations of CIR stay.  We reviewed 3 hrs/day of therapy, physician follow up, and average length of stay 2 weeks (dependent upon progress) with goals of supervision for mobility.  We discussed the events leading up to this hospitalization, specifically discharge from rehab and the inability to access transportation for HD appointments.  Prior to June, pt was able to drive himself to HD.  After recent rehab stay he was unable to ambulate up to transportation vehicle, and his mother is unable to physically push him in a w/c.  We discussed that given his progress, and co-morbidities (namely foot drop, neuropathy, and significantly decreased cardiovascular endurance), I am concerned that 2 weeks of rehab may not make a significant enough change in his status to allow him to safely/effectively access transportation to dialysis, ultimately leading to a re-admission.  He is extremely motivated, and I'm hopeful that he will show more progress with therapy.  We discussed need for insurance approval as well. PMR MD to review.   Estill Dooms, PT, DPT Admissions Coordinator [phone] 05/31/23  12:10 PM

## 2023-05-31 NOTE — Progress Notes (Signed)
Occupational Therapy Treatment Patient Details Name: Phillip Young MRN: [id number] DOB: 05-11-1986 Today's Date: 05/31/2023   History of present illness Phillip Young is a 37 y.o. male presenting with BLE swelling and pain after missing HD appointments for 1 week due to transportation issues. Pt was going to be d/c on 10/29, however, pt w/ a-fib with RVR 2/2 sepsis with MSSA bacteria. Removal of HD catheter 10/31 due to infection with TCD catheter. Last admitted around 01/21/2023 - 03/23/23 with acute hypoxic respiratory failure. S/p placement of tunneled dialysis catheter 11/04. PMH PMH: morbid obesity, OSA on home BiPAP, obesity hypoventilation syndrome, chronic systolic heart failure due to nonischemic cardiomyopathy, dialysis   OT comments  Pt with good progress toward established OT goals. Updated discharge recommendation to [facility] as pt with improved activity tolerance. Pt needing total A for LB ADL secondary to pain. Performing bed mobility with close supervision and min cues for technique. Requiring seated rest break after ambulation to sink with intermittent min A. Pt able to rise up from recliner today with OT stabilizing RW and walk back to bed. Pt highly motivated to progress mobility and with much more confidence with RLE boot to prevent foot drop. Reporting he also has good support at home from his mother and other family members.       If plan is discharge home, recommend the following:  Two people to help with walking and/or transfers;A lot of help with bathing/dressing/bathroom;Assistance with cooking/housework   Equipment Recommendations  None recommended by OT    Recommendations for Other Services Rehab consult    Precautions / Restrictions Precautions Precautions: Fall Precaution Comments: R foot drop- R CAM boot? watch HR Restrictions Weight Bearing Restrictions: No       Mobility Bed Mobility Overal bed mobility: Needs Assistance Bed Mobility: Supine to Sit      Supine to sit: HOB elevated, Used rails, Supervision     General bed mobility comments: min cues for optimal use of bed rails    Transfers Overall transfer level: Needs assistance Equipment used: Rolling walker (2 wheels) Transfers: Sit to/from Stand Sit to Stand: Contact guard assist, From elevated surface, Min assist           General transfer comment: CGA up from elevated EOB; Min A + to steady RW no lifting assist up from chair.     Balance Overall balance assessment: Needs assistance Sitting-balance support: Feet supported, No upper extremity supported Sitting balance-Leahy Scale: Good     Standing balance support: Bilateral upper extremity supported, During functional activity, Reliant on assistive device for balance Standing balance-Leahy Scale: Poor Standing balance comment: Forward lean onto RW during standing activities, able to propel forward, backward, and pivot with ambulation using RW                           ADL either performed or assessed with clinical judgement   ADL Overall ADL's : Needs assistance/impaired     Grooming: Oral care;Set up;Sitting Grooming Details (indicate cue type and reason): EOB             Lower Body Dressing: Total assistance;Bed level Lower Body Dressing Details (indicate cue type and reason): to don RLE boot and L shoe, predominantly secondary to pain Toilet Transfer: Minimal assistance;Ambulation;Rolling walker (2 wheels);BSC/3in1 Toilet Transfer Details (indicate cue type and reason): ~10 ft         Functional mobility during ADLs: Minimal assistance;Rolling walker (2 wheels) General ADL Comments:  intermittent min A for standing.    Extremity/Trunk Assessment Upper Extremity Assessment Upper Extremity Assessment: RUE deficits/detail RUE Deficits / Details: limited shoulder flexion, pain with PROM beyond 90*  and PROM is WNL but he can acheive ~80* AROM but pain persists (reports this occurred after  hospitalization in June 2024) RUE: Shoulder pain with ROM RUE Coordination: decreased gross motor   Lower Extremity Assessment Lower Extremity Assessment: Defer to PT evaluation        Vision       Perception     Praxis      Cognition Arousal: Alert Behavior During Therapy: [facility] for tasks assessed/performed Overall Cognitive Status: Within Functional Limits for tasks assessed                                          Exercises      Shoulder Instructions       General Comments 5L supplemental O2 throughout session with SpO2 >90. HR as high as 160 needing cues for initiation of rest break    Pertinent Vitals/ Pain       Pain Assessment Pain Assessment: Faces Faces Pain Scale: Hurts whole lot Pain Location: BLE to touch/neuropathy Pain Descriptors / Indicators: Grimacing, Burning, Discomfort, Numbness Pain Intervention(s): Limited activity within patient's tolerance, Monitored during session  Home Living                                          Prior Functioning/Environment              Frequency  Min 1X/week        Progress Toward Goals  OT Goals(current goals can now be found in the care plan section)  Progress towards OT goals: Progressing toward goals  Acute Rehab OT Goals Patient Stated Goal: get better OT Goal Formulation: With patient Time For Goal Achievement: 06/02/23 Potential to Achieve Goals: Good ADL Goals Pt Will Perform Lower Body Bathing: sitting/lateral leans;with set-up;with supervision;with adaptive equipment Pt Will Transfer to Toilet: with +2 assist;with max assist;stand pivot transfer;bedside commode Pt/caregiver will Perform Home Exercise Program: Increased strength;Independently;With theraband;Both right and left upper extremity Additional ADL Goal #1: Pt will independently advocate the need for feet moisturization prn to staff, to prevent skin breakdown, at least 3x throughout the week.   Plan      Co-evaluation                 AM-PAC OT "6 Clicks" Daily Activity     Outcome Measure   Help from another person eating meals?: None Help from another person taking care of personal grooming?: A Little Help from another person toileting, which includes using toliet, bedpan, or urinal?: A Lot Help from another person bathing (including washing, rinsing, drying)?: A Lot Help from another person to put on and taking off regular upper body clothing?: A Little Help from another person to put on and taking off regular lower body clothing?: A Lot 6 Click Score: 16    End of Session Equipment Utilized During Treatment: Gait belt;Rolling walker (2 wheels);Oxygen  OT Visit Diagnosis: Unsteadiness on feet (R26.81);Other abnormalities of gait and mobility (R26.89);Pain Pain - part of body: Ankle and joints of foot   Activity Tolerance Patient tolerated treatment well   Patient Left in bed;with call  bell/phone within reach   Nurse Communication Mobility status        Time: 1023-1106 OT Time Calculation (min): 43 min  Charges: OT General Charges $OT Visit: 1 Visit OT Treatments $Self Care/Home Management : 23-37 mins $Therapeutic Activity: 8-22 mins  Myrla Halsted, OTD, OTR/L [facility] Acute Rehabilitation Office: [phone]   Myrla Halsted 05/31/2023, 12:27 PM

## 2023-06-01 DIAGNOSIS — I5020 Unspecified systolic (congestive) heart failure: Secondary | ICD-10-CM

## 2023-06-01 LAB — CBC
HCT: 32.6 % — ABNORMAL LOW (ref 39.0–52.0)
Hemoglobin: 10.2 g/dL — ABNORMAL LOW (ref 13.0–17.0)
MCH: 31 pg (ref 26.0–34.0)
MCHC: 31.3 g/dL (ref 30.0–36.0)
MCV: 99.1 fL (ref 80.0–100.0)
Platelets: 305 10*3/uL (ref 150–400)
RBC: 3.29 MIL/uL — ABNORMAL LOW (ref 4.22–5.81)
RDW: 15.3 % (ref 11.5–15.5)
WBC: 6.3 10*3/uL (ref 4.0–10.5)
nRBC: 0 % (ref 0.0–0.2)

## 2023-06-01 LAB — RENAL FUNCTION PANEL
Albumin: 3 g/dL — ABNORMAL LOW (ref 3.5–5.0)
Anion gap: 18 — ABNORMAL HIGH (ref 5–15)
BUN: 46 mg/dL — ABNORMAL HIGH (ref 6–20)
CO2: 22 mmol/L (ref 22–32)
Calcium: 10.2 mg/dL (ref 8.9–10.3)
Chloride: 94 mmol/L — ABNORMAL LOW (ref 98–111)
Creatinine, Ser: 10.79 mg/dL — ABNORMAL HIGH (ref 0.61–1.24)
GFR, Estimated: 6 mL/min — ABNORMAL LOW (ref >60)
Glucose, Bld: 85 mg/dL (ref 70–99)
Phosphorus: 5.7 mg/dL — ABNORMAL HIGH (ref 2.5–4.6)
Potassium: 4.3 mmol/L (ref 3.5–5.1)
Sodium: 134 mmol/L — ABNORMAL LOW (ref 135–145)

## 2023-06-01 LAB — CULTURE, BLOOD (ROUTINE X 2)
Culture: NO GROWTH
Culture: NO GROWTH
Special Requests: ADEQUATE
Special Requests: ADEQUATE

## 2023-06-01 MED ORDER — ANTICOAGULANT SODIUM CITRATE 4% (200MG/5ML) IV SOLN
5.0000 mL | Status: DC | PRN
  Administered 2023-06-01: 5 mL
  Filled 2023-06-01: qty 5

## 2023-06-01 MED ORDER — ALTEPLASE 2 MG IJ SOLR: 2.0000 mg | Freq: Once | INTRAMUSCULAR | Status: DC | PRN

## 2023-06-01 NOTE — Progress Notes (Signed)
Patient Name: Phillip Young Date of Encounter: 06/01/2023 [facility] Cardiologist: Marca Ancona, MD   Interval Summary  .    Feeling well.  Ambulated with PT yesterday.  He notes that his heart rate is well-controlled at rest.  It goes up significantly with exertion.  Vital Signs .    Vitals:   05/31/23 1938 05/31/23 2204 06/01/23 0446 06/01/23 0859  BP:  123/82 (!) 144/85 112/62  Pulse: 63 91 78 93  Resp:  19 14 (!) 28  Temp:  98.5 F (36.9 C) 98.5 F (36.9 C) 98.4 F (36.9 C)  TempSrc:  Oral Oral   SpO2: 97%   98%  Weight:   (!) 158.8 kg (!) 158.7 kg  Height:        Intake/Output Summary (Last 24 hours) at 06/01/2023 1038 Last data filed at 06/01/2023 0600 Gross per 24 hour  Intake 820 ml  Output 50 ml  Net 770 ml      06/01/2023    8:59 AM 06/01/2023    4:46 AM 05/31/2023    5:00 AM  Last 3 Weights  Weight (lbs) 349 lb 13.9 oz 350 lb 361 lb 5.3 oz  Weight (kg) 158.7 kg 158.759 kg 163.9 kg      Telemetry/ECG    Atrial fibrillation.  Rates are mostly controlled intermittently up to the 140s.- Personally Reviewed  Physical Exam .    VS:  BP 112/62   Pulse 93   Temp 98.4 F (36.9 C)   Resp (!) 28   Ht 6' (1.829 m)   Wt (!) 158.7 kg Comment: Taken via bed.  SpO2 98%   BMI 47.45 kg/m  , BMI Body mass index is 47.45 kg/m. GENERAL: No acute distress HEENT: Pupils equal round and reactive, fundi not visualized, oral mucosa unremarkable NECK:  No jugular venous distention, waveform within normal limits, carotid upstroke brisk and symmetric, no bruits, no thyromegaly LUNGS:  Clear to auscultation bilaterally HEART: Irregularly irregular.  PMI not displaced or sustained,S1 and S2 within normal limits, no S3, no S4, no clicks, no rubs, no murmurs ABD:  Flat, positive bowel sounds normal in frequency in pitch, no bruits, no rebound, no guarding, no midline pulsatile mass, no hepatomegaly, no splenomegaly EXT:  2 plus pulses throughout, no edema, no  cyanosis no clubbing SKIN:  No rashes no nodules NEURO:  Cranial nerves II through XII grossly intact, motor grossly intact throughout PSYCH:  Cognitively intact, oriented to person place and time   Assessment & Plan .     Mr. Barringer is a 72M with HFrEF (EF 20-25%), PAF ESRD on HD admitted with volumes overload in the setting of missing HD, atrial fibrillation with RVR and MSSA bacteremia 2/2 line infection.    # PAF:  He remains in atrial fibrillation.  Rate is reasonably controlled on digoxin.  Digoxin levels have been OK.  Not a candidate for amiodarone or AV nodal ablation with PPM.  Failed attempts at DCCV.  Continue Eliquis. Requires midodrine for BP so not a candidate for nodal agents.  We discussed the fact that some of his heart rate going up so much with exertion is due to deconditioning.  Encouraged him to keep working hard with physical therapy.   # MSSA bacteremia: Treated with 6 weeks IV antibiotics per ID.  No plans for TEE.  HD catheter was changed out this admission.    # HFrEF:  Volume managed with HD.  Not a candidate for GDMT 2/2  hypotension and renal failure.  Continue digoxin.   [facility] will sign off.   Medication Recommendations: No changes Other recommendations (labs, testing, etc): Continue checking digoxin level at least every 3 months Follow up as an outpatient: Within 1 month of discharge    For questions or updates, please contact [facility] Please consult [email or web address] for contact info under        Signed, Chilton Si, MD

## 2023-06-01 NOTE — Plan of Care (Signed)
  Problem: Pain Management: Goal: General experience of comfort will improve Outcome: Progressing   Problem: Safety: Goal: Ability to remain free from injury will improve Outcome: Progressing   Problem: Skin Integrity: Goal: Risk for impaired skin integrity will decrease Outcome: Progressing

## 2023-06-01 NOTE — Progress Notes (Signed)
Orthopedic Tech Progress Note Patient Details:  Phillip Young [birth year] [id number]  Patient ID: [name], male   DOB: 1986/01/16, 37 y.o.   MRN: [id number] I called foot up brace order into [facility] 06/01/2023, 6:17 PM

## 2023-06-01 NOTE — Progress Notes (Signed)
PT Cancellation Note  Patient Details Name: Phillip Young MRN: [id number] DOB: Jun 06, 1986   Cancelled Treatment:    Reason Eval/Treat Not Completed: Patient at procedure or test/unavailable (Pt in HD.  Will return as able.)   Phillip Young 06/01/2023, 10:09 AM Phillip Young,PT Acute Rehab Services [phone]

## 2023-06-01 NOTE — Progress Notes (Signed)
Received patient in bed.Awake,alert and oriented x 4.This procedure has been fully reviewed with the patient and written informed consent has been obtained.   Access used: Right Hd catheter that worked well : Dressing on date>  Medicine given : Zofran 4 mg.                            Cefazolin 2 gram  Duration of treatment: 3.5 hours.  UF goal.Met 2 liters goal.  Tolerated treatment: Yes.  Hand off to the patient's nurse:Back into his room with stable medical condition via transporter.

## 2023-06-01 NOTE — Progress Notes (Signed)
HD#8 SUBJECTIVE:  Patient Summary: Phillip Young is a 37 y.o. man with a pertinent PMH of ESRD on MWF HD, HFrEF, chronic hypoxic respiratory failure on 5L [place], Afib on Eliquis who presented with shortness of breath that progressed to fevers and chills and is now admitted for Sepsis / MSSA bacteremia.   Overnight Events: None  Interim History: Pt reports improvement in his neck pain (from dialysis port placement).  OBJECTIVE:  Vital Signs: Vitals:   06/01/23 1030 06/01/23 1045 06/01/23 1100 06/01/23 1115  BP: (!) 105/55 (!) 110/46 (!) 93/51 113/64  Pulse:      Resp: 20 (!) 24 (!) 24 (!) 21  Temp:      TempSrc:      SpO2:      Weight:      Height:       Supplemental O2: Nasal Cannula SpO2: 98 % O2 Flow Rate (L/min): 4 L/min  Filed Weights   05/31/23 0500 06/01/23 0446 06/01/23 0859  Weight: (!) 163.9 kg (!) 158.8 kg (!) 158.7 kg     Intake/Output Summary (Last 24 hours) at 06/01/2023 1124 Last data filed at 06/01/2023 0600 Gross per 24 hour  Intake 820 ml  Output 50 ml  Net 770 ml   Net IO Since Admission: -12,592.04 mL [06/01/23 1124]  Physical Exam: Physical Exam Constitutional:      General: He is not in acute distress.    Appearance: Normal appearance. He is not ill-appearing.  Cardiovascular:     Rate and Rhythm: Normal rate and regular rhythm.     Pulses: Normal pulses.  Pulmonary:     Effort: Pulmonary effort is normal.     Breath sounds: Normal breath sounds.  Abdominal:     General: Abdomen is flat.     Palpations: Abdomen is soft.     Tenderness: There is no abdominal tenderness.  Musculoskeletal:        General: Tenderness present.     Right lower leg: No edema.     Left lower leg: No edema.     Comments: Stable neuropathic pain in b/l LEs  Skin:    General: Skin is warm and dry.     Capillary Refill: Capillary refill takes less than 2 seconds.  Neurological:     General: No focal deficit present.     Mental Status: He is alert and oriented  to person, place, and time.  Psychiatric:        Mood and Affect: Mood normal.        Behavior: Behavior normal.     Patient Lines/Drains/Airways Status     Active Line/Drains/Airways     Name Placement date Placement time Site Days   Peripheral IV 05/29/23 20 G 1" Anterior;Distal;Left Forearm 05/29/23  1148  Forearm  3   Hemodialysis Catheter Right Internal jugular Double lumen Permanent (Tunneled) 05/30/23  0856  Internal jugular  2   Pressure Injury 02/03/23 Coccyx Mid Stage 2 -  Partial thickness loss of dermis presenting as a shallow open injury with a red, pink wound bed without slough. 02/03/23  0800  -- 118   Pressure Injury 05/18/23 Buttocks Right Stage 3 -  Full thickness tissue loss. Subcutaneous fat may be visible but bone, tendon or muscle are NOT exposed. 05/18/23  2300  -- 14   Pressure Injury 05/18/23 Buttocks Left Stage 2 -  Partial thickness loss of dermis presenting as a shallow open injury with a red, pink wound bed without slough. 05/18/23  2300  -- 14             ASSESSMENT/PLAN:  Assessment: Principal Problem:   Sepsis ([name]) Active Problems:   ESRD (end stage renal disease) on dialysis ([name])   Congestive heart failure ([name])   Acute on chronic hypoxic respiratory failure ([name])   Atrial fibrillation ([name])   Pressure injury of skin   MSSA bacteremia   Neuropathy   HFrEF (heart failure with reduced ejection fraction) ([name])  Phillip Young is a 37 y.o. man with a pertinent PMH of ESRD on MWF HD, HFrEF, chronic hypoxic respiratory failure on 5L [place], Afib on Eliquis who presented with shortness of breath that progressed to fevers and chills and is now admitted for Sepsis / MSSA bacteremia.   Plan: Sepsis MSSA bacteremia Stable and medically ready for discharge. Remains afebrile since starting IV antibiotics. HD holiday completed, now resumed HD on MWF. TTE without obvious vegetations, TEE contraindicated due to comorbidities. Thus, we will have 6 weeks of IV  antibiotic therapy with HD to end 07/06/23. - Continue cefazolin - Map goal > 65. fluid boluses if consistently below.  - Anticipate discharge to home vs CIR ASAP as he seeks approval for CIR.   Pressure injury of skin L buttocks subsequent to 2 month hospitalization this summer. No signs of infection, healing well. Continue good wound care.   Debility Immobility, R foot drop Neuropathy Stable, but a barrier to dialysis OP. PT/OT has been working with him. We got this man a brace for the R foot. Would benefit from OP physical therapy. Neuropathy and foot drop after recent, prolonged hospitalization.  Continue gabapentin, urea and lidocaine lotions. Consider Cymbalta outpatient.   ESRD HD catheter holiday over the weekend, [facility] replaced 11/4. Now MWF.  Volume status, electrolytes, and bicarb are stable.   Stable Chronic Conditions   Chronic HFrEF LVEF 30 to 35%.  Well compensated.  Continue digoxin 0.125 mg p.o. 3 times weekly   Persistent atrial fibrillation Rate controlled at present.  Amiodarone poor choice given young age and risk to lungs.  Ablation risks outweigh benefits.  Continue anticoagulation with apixaban 5 mg p.o. twice daily.   Chronic hypoxic respiratory failure Stable supplemental O2 requirement. 5L is baseline. Tolerates much less when at rest.   Hypotension Chronic, doing okay on midodrine 30 mg 3 times daily AC. Can do boluses if consistently low maps.   Pt is medically stable for discharge. Safe dispo pending.  Best Practice: Diet: Renal diet with 1200 mL fluid restriction IVF: none VTE: Eliquis, Place TED hose Start: 05/22/23 1313 Code: Full AB: Ancef Therapy Recs: SNF DISPO: Anticipated discharge today-tomorrow to Home vs CIR.  Signature: [name], D.O.  Internal Medicine Resident, PGY-1 [facility]  Pager: [phone] 11:24 AM, 06/01/2023   Please contact the on call pager after 5 pm and on  weekends at [phone].

## 2023-06-01 NOTE — Progress Notes (Signed)
Physical Therapy Treatment Patient Details Name: Phillip Young MRN: [id number] DOB: Jul 24, 1986 Today's Date: 06/01/2023   History of Present Illness Phillip Young is a 37 y.o. male presenting with BLE swelling and pain after missing HD appointments for 1 week due to transportation issues. Pt was going to be d/c on 10/29, however, pt w/ a-fib with RVR 2/2 sepsis with MSSA bacteria. Removal of HD catheter 10/31 due to infection with TCD catheter. Last admitted around 01/21/2023 - 03/23/23 with acute hypoxic respiratory failure. S/p placement of tunneled dialysis catheter 11/04. PMH PMH: morbid obesity, OSA on home BiPAP, obesity hypoventilation syndrome, chronic systolic heart failure due to nonischemic cardiomyopathy, dialysis    PT Comments  Patient progressing slowly limited by symptomatic tachycardia with ambulation short distances in the room.  Still eager to attempt and make progress.  Noted using camboot for R foot drop though not as effective with increased energy expenditure.  Discussed with MD and will follow up with ortho tech on what has been ordered.  PT will continue to follow.  Hopeful for inpatient rehab (>3  hours/day) prior to home.    If plan is discharge home, recommend the following: Help with stairs or ramp for entrance;Assist for transportation;Assistance with cooking/housework;A lot of help with walking and/or transfers;A lot of help with bathing/dressing/bathroom   Can travel by private vehicle        Equipment Recommendations  Other (comment) (TBA)    Recommendations for Other Services       Precautions / Restrictions Precautions Precautions: Fall Precaution Comments: R foot drop- R CAM boot? watch HR     Mobility  Bed Mobility Overal bed mobility: Needs Assistance       Supine to sit: HOB elevated, Used rails, Min assist Sit to supine: Mod assist   General bed mobility comments: assist for lifting trunk to sit, to supine assist for legs onto bed     Transfers Overall transfer level: Needs assistance Equipment used: Rolling walker (2 wheels)   Sit to Stand: Contact guard assist, From elevated surface, Min assist   Step pivot transfers: +2 safety/equipment, From elevated surface, Contact guard assist       General transfer comment: CGA from EOB at elevated height x 5 reps    Ambulation/Gait Ambulation/Gait assistance: +2 safety/equipment, Contact guard assist Gait Distance (Feet): 8 Feet Assistive device: Rolling walker (2 wheels) Gait Pattern/deviations: Step-to pattern, Decreased stride length, Trunk flexed, Wide base of support       General Gait Details: 4' fwd then back with RW and A for safety on 4L O2 with HR up to 155; wearing cam boot   Stairs             Wheelchair Mobility     Tilt Bed    Modified Rankin (Stroke Patients Only)       Balance Overall balance assessment: Needs assistance Sitting-balance support: Feet supported Sitting balance-Leahy Scale: Good     Standing balance support: Bilateral upper extremity supported Standing balance-Leahy Scale: Fair Standing balance comment: can stand without UE support, using walker for ambulation                            Cognition Arousal: Alert Behavior During Therapy: WFL for tasks assessed/performed Overall Cognitive Status: Within Functional Limits for tasks assessed  Exercises      General Comments General comments (skin integrity, edema, etc.): 4L O2 HR up to 155 with ambulation seated rest x 4; attempted orthostatics, but cuff not measuring in standing on his wrist; called resident DG:[id number] for foot drop, did see note from ortho tech ordered custom AFO for foot drop from [employer] 05/30/23      Pertinent Vitals/Pain Pain Assessment Pain Assessment: Faces Faces Pain Scale: Hurts whole lot Pain Location: BLE to touch/neuropathy Pain Descriptors / Indicators:  Grimacing, Burning, Discomfort, Numbness Pain Intervention(s): Monitored during session, Repositioned    Home Living                          Prior Function            PT Goals (current goals can now be found in the care plan section) Progress towards PT goals: Progressing toward goals    Frequency    Min 1X/week      PT Plan      Co-evaluation              AM-PAC PT "6 Clicks" Mobility   Outcome Measure  Help needed turning from your back to your side while in a flat bed without using bedrails?: A Little Help needed moving from lying on your back to sitting on the side of a flat bed without using bedrails?: A Little Help needed moving to and from a bed to a chair (including a wheelchair)?: A Little Help needed standing up from a chair using your arms (e.g., wheelchair or bedside chair)?: A Little Help needed to walk in hospital room?: A Lot Help needed climbing 3-5 steps with a railing? : Total 6 Click Score: 15    End of Session Equipment Utilized During Treatment: Oxygen Activity Tolerance: Patient limited by fatigue Patient left: in bed;with call bell/phone within reach   PT Visit Diagnosis: Muscle weakness (generalized) (M62.81);Difficulty in walking, not elsewhere classified (R26.2)     Time: 4259-5638 PT Time Calculation (min) (ACUTE ONLY): 32 min  Charges:    $Gait Training: 8-22 mins $Therapeutic Activity: 8-22 mins PT General Charges $$ ACUTE PT VISIT: 1 Visit                     Sheran Lawless, PT Acute Rehabilitation Services Office:[phone] 06/01/2023    Elray Mcgregor 06/01/2023, 5:16 PM

## 2023-06-01 NOTE — Progress Notes (Signed)
Inpatient Rehab Admissions Coordinator:   Attempted to open insurance request for prior auth with [employer] but notified policy termed on 10/31.  Spoke to pt and Phillip Young, as well as rep from [employer].  Appears pt may have an active Medicare A/B policy, though we have not been able to verify a Medicare Beneficiary ID number.  We have effective dates for Part A (10/2021) and B (03/2023) per [employer].  Per review of account notes also may have Medicaid (application pending as of 05/20/23).  I've reached out to [facility] and Financial counseling to try and verify some coverage for Phillip Young.    Estill Dooms, PT, DPT Admissions Coordinator [phone] 06/01/23  4:07 PM

## 2023-06-01 NOTE — Progress Notes (Signed)
Inpatient Rehab Admissions Coordinator:   Discussed with PMR physicians, pt, and therapy.  We will try to submit for insurance auth today.    Estill Dooms, PT, DPT Admissions Coordinator [phone] 06/01/23  12:02 PM

## 2023-06-01 NOTE — Plan of Care (Signed)
  Problem: Health Behavior/Discharge Planning: Goal: Ability to manage health-related needs will improve Outcome: Progressing   Problem: Clinical Measurements: Goal: Respiratory complications will improve Outcome: Progressing Goal: Cardiovascular complication will be avoided Outcome: Progressing   Problem: Activity: Goal: Risk for activity intolerance will decrease Outcome: Progressing   Problem: Elimination: Goal: Will not experience complications related to bowel motility Outcome: Progressing   Problem: Pain Management: Goal: General experience of comfort will improve Outcome: Progressing   Problem: Safety: Goal: Ability to remain free from injury will improve Outcome: Progressing

## 2023-06-01 NOTE — Progress Notes (Signed)
PT Cancellation Note  Patient Details Name: Phillip Young MRN: [id number] DOB: 1985/08/09   Cancelled Treatment:    Reason Eval/Treat Not Completed: Patient at procedure or test/unavailable (Pt in HD.  Will return as able.)   Bevelyn Buckles 06/01/2023, 12:21 PM Arvell Pulsifer M,PT Acute Rehab Services [phone]

## 2023-06-01 NOTE — Progress Notes (Signed)
Per ccmd, pt had 6 beat run of v tach, not sustained. Pt asymptomatic. Dr Lyn Hollingshead with internal medicine notified. Pt remains on telemetry. No new orders at this time.

## 2023-06-01 NOTE — Procedures (Signed)
Patient seen on Hemodialysis. BP 112/62   Pulse 93   Temp 98.4 F (36.9 C)   Resp (!) 28   Ht 6' (1.829 m)   Wt (!) 158.7 kg Comment: Taken via bed.  SpO2 98%   BMI 47.45 kg/m   QB 400, UF goal 2L Tolerating treatment without complaints at this time with improved pain/soreness over RIJ [facility] site.   Zetta Bills MD [facility]. Office # [phone] Pager # [phone] 9:55 AM

## 2023-06-02 LAB — RENAL FUNCTION PANEL
Albumin: 2.7 g/dL — ABNORMAL LOW (ref 3.5–5.0)
Anion gap: 11 (ref 5–15)
BUN: 18 mg/dL (ref 6–20)
CO2: 26 mmol/L (ref 22–32)
Calcium: 9.9 mg/dL (ref 8.9–10.3)
Chloride: 98 mmol/L (ref 98–111)
Creatinine, Ser: 7.76 mg/dL — ABNORMAL HIGH (ref 0.61–1.24)
GFR, Estimated: 8 mL/min — ABNORMAL LOW (ref >60)
Glucose, Bld: 87 mg/dL (ref 70–99)
Phosphorus: 4.7 mg/dL — ABNORMAL HIGH (ref 2.5–4.6)
Potassium: 3.9 mmol/L (ref 3.5–5.1)
Sodium: 135 mmol/L (ref 135–145)

## 2023-06-02 MED ORDER — METOCLOPRAMIDE HCL 5 MG/ML IJ SOLN
5.0000 mg | Freq: Once | INTRAMUSCULAR | Status: AC
  Administered 2023-06-02: 5 mg via INTRAVENOUS
  Filled 2023-06-02: qty 2

## 2023-06-02 MED ORDER — CHLORHEXIDINE GLUCONATE CLOTH 2 % EX PADS
6.0000 | MEDICATED_PAD | Freq: Every day | CUTANEOUS | Status: DC
  Administered 2023-06-03: 6 via TOPICAL

## 2023-06-02 NOTE — Progress Notes (Signed)
Per ccmd, pt had 2 second pause. Pt asymptomatic. [name], DO with internal medicine paged. Pt remains on telemetry. No new orders at this time.

## 2023-06-02 NOTE — Plan of Care (Signed)
  Problem: Nutrition: Goal: Adequate nutrition will be maintained Outcome: Progressing   Problem: Pain Management: Goal: General experience of comfort will improve Outcome: Progressing   Problem: Safety: Goal: Ability to remain free from injury will improve Outcome: Progressing   Problem: Activity: Goal: Capacity to carry out activities will improve Outcome: Progressing   Problem: Cardiac: Goal: Ability to achieve and maintain adequate cardiopulmonary perfusion will improve Outcome: Progressing   Problem: Skin Integrity: Goal: Risk for impaired skin integrity will decrease Outcome: Not Progressing

## 2023-06-02 NOTE — Progress Notes (Signed)
Physical Therapy Treatment Patient Details Name: Phillip Young MRN: [id number] DOB: 10-Nov-1985 Today's Date: 06/02/2023   History of Present Illness Phillip Young is a 37 y.o. male presenting with BLE swelling and pain after missing HD appointments for 1 week due to transportation issues. Pt was going to be d/c on 10/29, however, pt w/ a-fib with RVR 2/2 sepsis with MSSA bacteria. Removal of HD catheter 10/31 due to infection with TCD catheter. Last admitted around 01/21/2023 - 03/23/23 with acute hypoxic respiratory failure. S/p placement of tunneled dialysis catheter 11/04. PMH PMH: morbid obesity, OSA on home BiPAP, obesity hypoventilation syndrome, chronic systolic heart failure due to nonischemic cardiomyopathy, dialysis    PT Comments  Pt admitted with above diagnosis. Pt was able to ambulate short bouts with CGA and is limited by HR to 152 bpm and incr anxiety. Has to have rest breaks. AFO that ortho brought doesn't work with pts current shoes due to his LE swelling therefore continued to use CAM walker. Will continue to progress pt as able.  Pt currently with functional limitations due to the deficits listed below (see PT Problem List). Pt will benefit from acute skilled PT to increase their independence and safety with mobility to allow discharge.       If plan is discharge home, recommend the following: Help with stairs or ramp for entrance;Assist for transportation;Assistance with cooking/housework;A lot of help with walking and/or transfers;A lot of help with bathing/dressing/bathroom   Can travel by private vehicle     No  Equipment Recommendations  Other (comment) (TBA)    Recommendations for Other Services       Precautions / Restrictions Precautions Precautions: Fall Precaution Comments: R foot drop- R CAM boot vs. shoe with AFO (shoe doesnt fit);  watch HR Restrictions Weight Bearing Restrictions: No     Mobility  Bed Mobility Overal bed mobility: Needs Assistance Bed  Mobility: Supine to Sit Rolling: Supervision   Supine to sit: Supervision Sit to supine: Contact guard assist   General bed mobility comments: No assist needed to get to EOB    Transfers Overall transfer level: Needs assistance Equipment used: Rolling walker (2 wheels) Transfers: Sit to/from Stand Sit to Stand: Contact guard assist, From elevated surface           General transfer comment: CGA from EOB at elevated height x 5 reps    Ambulation/Gait Ambulation/Gait assistance: +2 safety/equipment, Contact guard assist Gait Distance (Feet): 8 Feet (8 feet x 2) Assistive device: Rolling walker (2 wheels) Gait Pattern/deviations: Step-to pattern, Decreased stride length, Trunk flexed, Wide base of support   Gait velocity interpretation: <1.31 ft/sec, indicative of household ambulator   General Gait Details: 4' fwd then back with RW and A for safety on 4L O2 with HR up to 152; wearing cam boot as the AFO that ortho techs brought will not fit into pts shoe or croc.  no assist needed.  Limited by HR and anxiety.  Side stepped to [facility] prior to sitting back down.   Stairs             Wheelchair Mobility     Tilt Bed    Modified Rankin (Stroke Patients Only)       Balance Overall balance assessment: Needs assistance Sitting-balance support: Feet supported, No upper extremity supported Sitting balance-Leahy Scale: Good Sitting balance - Comments: EOB with no UE support between all ambulation trials   Standing balance support: Bilateral upper extremity supported Standing balance-Leahy Scale: Poor Standing balance comment:  can stand without UE support, using walker for ambulation                            Cognition Arousal: Alert Behavior During Therapy: WFL for tasks assessed/performed Overall Cognitive Status: Within Functional Limits for tasks assessed                                 General Comments: needed encouragement to  participate in session, limited by anxiety upon standing        Exercises Other Exercises Other Exercises: seated RLE AROM: LAQ x 5 reps Other Exercises: RLE PROM: Ankle pumps x5 reps; LLE AROM: AP x 5 reps    General Comments General comments (skin integrity, edema, etc.): Hr to 152 bpm, on 4LO2      Pertinent Vitals/Pain Pain Assessment Pain Assessment: Faces Faces Pain Scale: Hurts whole lot Breathing: normal Negative Vocalization: none Facial Expression: smiling or inexpressive Body Language: relaxed Consolability: no need to console PAINAD Score: 0 Pain Location: BLE to touch/neuropathy Pain Descriptors / Indicators: Grimacing, Burning, Discomfort, Numbness Pain Intervention(s): Limited activity within patient's tolerance, Monitored during session, Repositioned    Home Living                          Prior Function            PT Goals (current goals can now be found in the care plan section) Acute Rehab PT Goals Patient Stated Goal: decreased pain, get better Progress towards PT goals: Progressing toward goals    Frequency    Min 1X/week      PT Plan      Co-evaluation              AM-PAC PT "6 Clicks" Mobility   Outcome Measure  Help needed turning from your back to your side while in a flat bed without using bedrails?: A Little Help needed moving from lying on your back to sitting on the side of a flat bed without using bedrails?: A Little Help needed moving to and from a bed to a chair (including a wheelchair)?: A Little Help needed standing up from a chair using your arms (e.g., wheelchair or bedside chair)?: A Little Help needed to walk in hospital room?: A Lot Help needed climbing 3-5 steps with a railing? : Total 6 Click Score: 15    End of Session Equipment Utilized During Treatment: Oxygen Activity Tolerance: Patient limited by fatigue Patient left: in bed;with call bell/phone within reach (sitting on EOB) Nurse  Communication: Mobility status PT Visit Diagnosis: Muscle weakness (generalized) (M62.81);Difficulty in walking, not elsewhere classified (R26.2) Pain - Right/Left:  (both, L>R) Pain - part of body: Ankle and joints of foot     Time: 1125-1210 PT Time Calculation (min) (ACUTE ONLY): 45 min  Charges:    $Gait Training: 23-37 mins $Therapeutic Exercise: 8-22 mins PT General Charges $$ ACUTE PT VISIT: 1 Visit                     Simora Dingee M,PT Acute Rehab Services [phone]    Bevelyn Buckles 06/02/2023, 2:00 PM

## 2023-06-02 NOTE — Progress Notes (Signed)
HD#9 SUBJECTIVE:  Patient Summary: Phillip Young is a 37 y.o. man with a pertinent PMH of ESRD on MWF HD, HFrEF, chronic hypoxic respiratory failure on 5L [place], Afib on Eliquis who presented with shortness of breath that progressed to fevers and chills and is now admitted for Sepsis / MSSA bacteremia.   Overnight Events: None  Interim History: Pt without complaints this morning. He is working on Scientist, research (medical). No dyspnea, neck pain. Leg pain stable. His oxycodone causes nausea so he is avoiding it. Devoted to working hard at PT.  OBJECTIVE:  Vital Signs: Vitals:   06/02/23 0400 06/02/23 0820 06/02/23 0822 06/02/23 0926  BP: 99/66  111/75   Pulse: 92  65   Resp: 20 16    Temp: 98.2 F (36.8 C)  97.8 F (36.6 C) 97.8 F (36.6 C)  TempSrc: Oral  Oral Oral  SpO2: 98%  94% 98%  Weight: (!) 157 kg     Height:       Supplemental O2: Nasal Cannula SpO2: 98 % O2 Flow Rate (L/min): 4 L/min  Filed Weights   06/01/23 0859 06/01/23 1300 06/02/23 0400  Weight: (!) 158.7 kg (!) 156.6 kg (!) 157 kg     Intake/Output Summary (Last 24 hours) at 06/02/2023 0957 Last data filed at 06/02/2023 0400 Gross per 24 hour  Intake 712 ml  Output 2100 ml  Net -1388 ml   Net IO Since Admission: -13,980.04 mL [06/02/23 0957]  Physical Exam: Physical Exam Constitutional:      Appearance: Normal appearance.  Cardiovascular:     Rate and Rhythm: Normal rate and regular rhythm.     Pulses: Normal pulses.  Pulmonary:     Effort: Pulmonary effort is normal.     Breath sounds: Normal breath sounds.  Abdominal:     General: Abdomen is flat.     Palpations: Abdomen is soft.     Tenderness: There is no abdominal tenderness.  Musculoskeletal:        General: Tenderness present.     Cervical back: No tenderness.     Right lower leg: No edema.     Left lower leg: No edema.     Comments: Stable tenderness of lower extremities  Skin:    General: Skin is warm and dry.     Capillary Refill:  Capillary refill takes less than 2 seconds.  Neurological:     General: No focal deficit present.     Mental Status: He is alert and oriented to person, place, and time.  Psychiatric:        Mood and Affect: Mood normal.        Behavior: Behavior normal.     Patient Lines/Drains/Airways Status     Active Line/Drains/Airways     Name Placement date Placement time Site Days   Peripheral IV 05/29/23 20 G 1" Anterior;Distal;Left Forearm 05/29/23  1148  Forearm  4   Hemodialysis Catheter Right Internal jugular Double lumen Permanent (Tunneled) 05/30/23  0856  Internal jugular  3   Pressure Injury 02/03/23 Coccyx Mid Stage 2 -  Partial thickness loss of dermis presenting as a shallow open injury with a red, pink wound bed without slough. 02/03/23  0800  -- 119   Pressure Injury 05/18/23 Buttocks Right Stage 3 -  Full thickness tissue loss. Subcutaneous fat may be visible but bone, tendon or muscle are NOT exposed. 05/18/23  2300  -- 15   Pressure Injury 05/18/23 Buttocks Left Stage 2 -  Partial thickness loss of dermis presenting as a shallow open injury with a red, pink wound bed without slough. 05/18/23  2300  -- 15             ASSESSMENT/PLAN:  Assessment: Principal Problem:   Sepsis ([name]) Active Problems:   ESRD (end stage renal disease) on dialysis ([name])   Congestive heart failure ([name])   Acute on chronic hypoxic respiratory failure ([name])   Atrial fibrillation ([name])   Pressure injury of skin   MSSA bacteremia   Neuropathy   HFrEF (heart failure with reduced ejection fraction) ([name])   Phillip Young is a 37 y.o. man with a pertinent PMH of ESRD on MWF HD, HFrEF, chronic hypoxic respiratory failure on 5L [place], Afib on Eliquis who presented with shortness of breath that progressed to fevers and chills and is now admitted for Sepsis / MSSA bacteremia.    Plan: Sepsis MSSA bacteremia Stable and medically ready for discharge. Remains afebrile since starting IV antibiotics. HD  holiday completed, now resumed HD on MWF. TTE without obvious vegetations, TEE contraindicated due to comorbidities. Thus, we will have 6 weeks of IV antibiotic therapy with HD to end 07/06/23. - Continue cefazolin - Map goal > 65. fluid boluses if consistently below.  - Anticipate discharge to home vs CIR ASAP as he seeks approval for CIR. Pts insurance lapsed, working on restarting.   Pressure injury of skin L buttocks subsequent to 2 month hospitalization this summer. No signs of infection, healing well. Continue good wound care.   Debility Immobility, R foot drop Neuropathy Stable, but a barrier to dialysis OP. PT/OT has been working with him. He now has a boot and a brace for the R foot. Would benefit from OP physical therapy. Neuropathy and foot drop after recent, prolonged hospitalization. Continue gabapentin, urea and lidocaine lotions. Consider Cymbalta outpatient.   ESRD HD catheter holiday over the weekend, [facility] replaced 11/4. Now MWF.  Volume status, electrolytes, and bicarb are stable.   Stable Chronic Conditions   Chronic HFrEF LVEF 30 to 35%.  Well compensated.  Continue digoxin 0.125 mg p.o. 3 times weekly   Persistent atrial fibrillation Rate controlled at present.  Amiodarone poor choice given young age and risk to lungs.  Ablation risks outweigh benefits.  Continue anticoagulation with apixaban 5 mg p.o. twice daily.   Chronic hypoxic respiratory failure Stable supplemental O2 requirement. 5L is baseline. Tolerates much less when at rest.   Hypotension Chronic, doing okay on midodrine 30 mg 3 times daily AC. Can do boluses if consistently low maps.   Pt is medically stable for discharge. Safe dispo pending. Attempting to send to CIR, working on insurance. [name] insurance lapsed at end of October.   Best Practice: Diet: Renal diet with 1200 mL fluid restriction IVF: none VTE: Eliquis Code: Full AB: Ancef at HD thru 07/06/23 Therapy Recs: SNF DISPO:  Anticipated discharge today-tomorrow to Home vs CIR.  Signature: [name], D.O.  Internal Medicine Resident, PGY-1 [facility]  Pager: [phone] 9:57 AM, 06/02/2023   Please contact the on call pager after 5 pm and on weekends at [phone].

## 2023-06-02 NOTE — Progress Notes (Signed)
Inpatient Rehab Admissions Coordinator:   Continue to try and verify active Medicare coverage.  Medicaid is pending.  Pt and mom are actively assisting.  I am not able to pull an [employer]) with pt's SSN to verify policy.    Estill Dooms, PT, DPT Admissions Coordinator [phone] 06/02/23  2:20 PM

## 2023-06-02 NOTE — Plan of Care (Signed)
  Problem: Clinical Measurements: Goal: Respiratory complications will improve Outcome: Progressing Goal: Cardiovascular complication will be avoided Outcome: Progressing   Problem: Activity: Goal: Risk for activity intolerance will decrease Outcome: Progressing   Problem: Pain Management: Goal: General experience of comfort will improve Outcome: Progressing   Problem: Safety: Goal: Ability to remain free from injury will improve Outcome: Progressing   Problem: Skin Integrity: Goal: Risk for impaired skin integrity will decrease Outcome: Progressing

## 2023-06-02 NOTE — Progress Notes (Signed)
[facility] Progress Note   Subjective:   C/o nausea and neuropathy in his feet today. Denies SOB, CP, dizziness. Voice is still hoarse.   Objective Vitals:   06/02/23 0400 06/02/23 0820 06/02/23 0822 06/02/23 0926  BP: 99/66  111/75 113/72  Pulse: 92  65 76  Resp: 20 16  20   Temp: 98.2 F (36.8 C)  97.8 F (36.6 C) 97.8 F (36.6 C)  TempSrc: Oral  Oral Oral  SpO2: 98%  94% 98%  Weight: (!) 157 kg     Height:       Physical Exam General: Alert male in NAD Heart: RRR, no murmurs rubs or gallops Lungs: On O2 via [place], lungs CTA anteriorly Abdomen: Soft, +BS Extremities: No edema b/l lower extremities Dialysis Access: [facility]  Additional Objective Labs: Basic Metabolic Panel: Recent Labs  Lab 05/31/23 0659 06/01/23 0516 06/02/23 0445  NA 135 134* 135  K 4.5 4.3 3.9  CL 97* 94* 98  CO2 25 22 26   GLUCOSE 80 85 87  BUN 35* 46* 18  CREATININE 9.83* 10.79* 7.76*  CALCIUM 10.0 10.2 9.9  PHOS 4.9* 5.7* 4.7*   Liver Function Tests: Recent Labs  Lab 05/31/23 0659 06/01/23 0516 06/02/23 0445  ALBUMIN 2.7* 3.0* 2.7*   No results for input(s): "LIPASE", "AMYLASE" in the last 168 hours. CBC: Recent Labs  Lab 05/27/23 0636 05/28/23 0342 06/01/23 0720  WBC 6.5 5.6 6.3  NEUTROABS 3.9  --   --   HGB 9.2* 9.6* 10.2*  HCT 28.9* 30.7* 32.6*  MCV 98.3 99.4 99.1  PLT 172 204 305   Blood Culture    Component Value Date/Time   SDES BLOOD BLOOD RIGHT HAND 05/27/2023 0959   SPECREQUEST  05/27/2023 0959    BOTTLES DRAWN AEROBIC AND ANAEROBIC Blood Culture adequate volume   CULT  05/27/2023 0959    NO GROWTH 5 DAYS Performed at [facility], [address] [place], [place] [address]    REPTSTATUS 06/01/2023 FINAL 05/27/2023 6045    Cardiac Enzymes: No results for input(s): "CKTOTAL", "CKMB", "CKMBINDEX", "TROPONINI" in the last 168 hours. CBG: No results for input(s): "GLUCAP" in the last 168 hours. Iron Studies: No results for input(s): "IRON",  "TIBC", "TRANSFERRIN", "FERRITIN" in the last 72 hours. @lablastinr3 @ Studies/Results: No results found. Medications:   ceFAZolin (ANCEF) IV Stopped (06/01/23 1250)    apixaban  5 mg Oral BID   Chlorhexidine Gluconate Cloth  6 each Topical Q0600   darbepoetin (ARANESP) injection - DIALYSIS  150 mcg Subcutaneous Q Sat-1800   digoxin  0.125 mg Oral Once per day on Monday Wednesday Friday   feeding supplement (NEPRO CARB STEADY)  237 mL Oral BID BM   gabapentin  300 mg Oral Q M,W,F-HD   Gerhardt's butt cream   Topical TID   lanthanum  2,000 mg Oral TID WC   leptospermum manuka honey  1 Application Topical Daily   levothyroxine  25 mcg Oral Q0600   midodrine  30 mg Oral TID WC   urea   Topical Daily    Dialysis Orders: MWF at [facility] 4.5h   400/800    172kg   2K/2Ca bath   TDC   No heparin (+ HIT)  Assessment/Plan: 1. Acute hypoxic respiratory failure: Missed HD due to transportation issues. Now resolved, will need lower EDW at discharge 2. MSSA Bacteremia: [facility] consulted and underwent line holiday 05/26/23-05/30/23: On ancef.  2. ESRD: S/p line holiday as above, now back on schedule. Continue  MWF schedule, next HD tomorrow. HIT+, using sodium citrate for cath locks 3. HTN/volume:  On high dose midodrine 30mg  TID. BP appears to be stable. Volume status is improved. Likely close to new EDW 4. Anemia: Hgb 10.2, aranesp dose recently increased to q Saturday 5. Secondary hyperparathyroidism:  Calcium high, hold VDRA. Phos 5.7. Continue lanthanum. 6. Nutrition:  Continue renal diet and protein supplements     Rogers Blocker, PA-C 06/02/2023, 10:44 AM  [facility] Pager: [phone]

## 2023-06-03 ENCOUNTER — Other Ambulatory Visit: Payer: Self-pay

## 2023-06-03 ENCOUNTER — Inpatient Hospital Stay (HOSPITAL_COMMUNITY)
Admission: AD | Admit: 2023-06-03 | Discharge: 2023-06-20 | DRG: 945 | Disposition: A | Discharge status: Home Health | Payer: Medicaid Other | Source: Intra-hospital | Attending: Physical Medicine & Rehabilitation | Admitting: Physical Medicine & Rehabilitation

## 2023-06-03 ENCOUNTER — Encounter (HOSPITAL_COMMUNITY): Payer: Self-pay | Admitting: Physical Medicine & Rehabilitation

## 2023-06-03 DIAGNOSIS — M792 Neuralgia and neuritis, unspecified: Secondary | ICD-10-CM | POA: Diagnosis not present

## 2023-06-03 DIAGNOSIS — Z6841 Body Mass Index (BMI) 40.0 and over, adult: Secondary | ICD-10-CM | POA: Diagnosis not present

## 2023-06-03 DIAGNOSIS — N2581 Secondary hyperparathyroidism of renal origin: Secondary | ICD-10-CM | POA: Diagnosis present

## 2023-06-03 DIAGNOSIS — D631 Anemia in chronic kidney disease: Secondary | ICD-10-CM | POA: Diagnosis present

## 2023-06-03 DIAGNOSIS — R7881 Bacteremia: Secondary | ICD-10-CM | POA: Diagnosis present

## 2023-06-03 DIAGNOSIS — I5082 Biventricular heart failure: Secondary | ICD-10-CM | POA: Diagnosis present

## 2023-06-03 DIAGNOSIS — Z992 Dependence on renal dialysis: Secondary | ICD-10-CM

## 2023-06-03 DIAGNOSIS — J9621 Acute and chronic respiratory failure with hypoxia: Secondary | ICD-10-CM | POA: Diagnosis present

## 2023-06-03 DIAGNOSIS — E875 Hyperkalemia: Secondary | ICD-10-CM | POA: Diagnosis not present

## 2023-06-03 DIAGNOSIS — G5701 Lesion of sciatic nerve, right lower limb: Secondary | ICD-10-CM | POA: Diagnosis not present

## 2023-06-03 DIAGNOSIS — F54 Psychological and behavioral factors associated with disorders or diseases classified elsewhere: Secondary | ICD-10-CM | POA: Diagnosis not present

## 2023-06-03 DIAGNOSIS — L89313 Pressure ulcer of right buttock, stage 3: Secondary | ICD-10-CM | POA: Diagnosis present

## 2023-06-03 DIAGNOSIS — B9561 Methicillin susceptible Staphylococcus aureus infection as the cause of diseases classified elsewhere: Secondary | ICD-10-CM | POA: Diagnosis present

## 2023-06-03 DIAGNOSIS — R29898 Other symptoms and signs involving the musculoskeletal system: Secondary | ICD-10-CM | POA: Diagnosis present

## 2023-06-03 DIAGNOSIS — M19072 Primary osteoarthritis, left ankle and foot: Secondary | ICD-10-CM | POA: Diagnosis present

## 2023-06-03 DIAGNOSIS — I959 Hypotension, unspecified: Secondary | ICD-10-CM | POA: Diagnosis present

## 2023-06-03 DIAGNOSIS — E039 Hypothyroidism, unspecified: Secondary | ICD-10-CM | POA: Diagnosis present

## 2023-06-03 DIAGNOSIS — I10 Essential (primary) hypertension: Secondary | ICD-10-CM | POA: Diagnosis not present

## 2023-06-03 DIAGNOSIS — M79671 Pain in right foot: Secondary | ICD-10-CM | POA: Diagnosis not present

## 2023-06-03 DIAGNOSIS — F419 Anxiety disorder, unspecified: Secondary | ICD-10-CM | POA: Diagnosis not present

## 2023-06-03 DIAGNOSIS — M21371 Foot drop, right foot: Secondary | ICD-10-CM | POA: Diagnosis present

## 2023-06-03 DIAGNOSIS — I428 Other cardiomyopathies: Secondary | ICD-10-CM | POA: Diagnosis present

## 2023-06-03 DIAGNOSIS — Z5982 Transportation insecurity: Secondary | ICD-10-CM

## 2023-06-03 DIAGNOSIS — Z7901 Long term (current) use of anticoagulants: Secondary | ICD-10-CM

## 2023-06-03 DIAGNOSIS — Z79890 Hormone replacement therapy (postmenopausal): Secondary | ICD-10-CM

## 2023-06-03 DIAGNOSIS — Z87891 Personal history of nicotine dependence: Secondary | ICD-10-CM

## 2023-06-03 DIAGNOSIS — G4700 Insomnia, unspecified: Secondary | ICD-10-CM | POA: Diagnosis present

## 2023-06-03 DIAGNOSIS — Z79899 Other long term (current) drug therapy: Secondary | ICD-10-CM

## 2023-06-03 DIAGNOSIS — I480 Paroxysmal atrial fibrillation: Secondary | ICD-10-CM | POA: Diagnosis present

## 2023-06-03 DIAGNOSIS — R5381 Other malaise: Principal | ICD-10-CM | POA: Diagnosis present

## 2023-06-03 DIAGNOSIS — Z880 Allergy status to penicillin: Secondary | ICD-10-CM

## 2023-06-03 DIAGNOSIS — L89322 Pressure ulcer of left buttock, stage 2: Secondary | ICD-10-CM | POA: Diagnosis present

## 2023-06-03 DIAGNOSIS — E861 Hypovolemia: Secondary | ICD-10-CM | POA: Diagnosis not present

## 2023-06-03 DIAGNOSIS — G629 Polyneuropathy, unspecified: Secondary | ICD-10-CM | POA: Diagnosis present

## 2023-06-03 DIAGNOSIS — Z9981 Dependence on supplemental oxygen: Secondary | ICD-10-CM

## 2023-06-03 DIAGNOSIS — M7501 Adhesive capsulitis of right shoulder: Secondary | ICD-10-CM | POA: Diagnosis present

## 2023-06-03 DIAGNOSIS — G6281 Critical illness polyneuropathy: Secondary | ICD-10-CM | POA: Diagnosis not present

## 2023-06-03 DIAGNOSIS — Z888 Allergy status to other drugs, medicaments and biological substances status: Secondary | ICD-10-CM

## 2023-06-03 DIAGNOSIS — N186 End stage renal disease: Secondary | ICD-10-CM | POA: Diagnosis not present

## 2023-06-03 DIAGNOSIS — F4323 Adjustment disorder with mixed anxiety and depressed mood: Secondary | ICD-10-CM | POA: Diagnosis present

## 2023-06-03 DIAGNOSIS — I5022 Chronic systolic (congestive) heart failure: Secondary | ICD-10-CM | POA: Diagnosis not present

## 2023-06-03 DIAGNOSIS — R490 Dysphonia: Secondary | ICD-10-CM

## 2023-06-03 DIAGNOSIS — I878 Other specified disorders of veins: Secondary | ICD-10-CM | POA: Diagnosis present

## 2023-06-03 DIAGNOSIS — I132 Hypertensive heart and chronic kidney disease with heart failure and with stage 5 chronic kidney disease, or end stage renal disease: Secondary | ICD-10-CM | POA: Diagnosis present

## 2023-06-03 DIAGNOSIS — E8779 Other fluid overload: Secondary | ICD-10-CM | POA: Diagnosis not present

## 2023-06-03 DIAGNOSIS — G9341 Metabolic encephalopathy: Secondary | ICD-10-CM | POA: Diagnosis not present

## 2023-06-03 DIAGNOSIS — Z5329 Procedure and treatment not carried out because of patient's decision for other reasons: Secondary | ICD-10-CM | POA: Diagnosis not present

## 2023-06-03 LAB — RENAL FUNCTION PANEL
Albumin: 2.7 g/dL — ABNORMAL LOW (ref 3.5–5.0)
Anion gap: 14 (ref 5–15)
BUN: 26 mg/dL — ABNORMAL HIGH (ref 6–20)
CO2: 23 mmol/L (ref 22–32)
Calcium: 10.4 mg/dL — ABNORMAL HIGH (ref 8.9–10.3)
Chloride: 98 mmol/L (ref 98–111)
Creatinine, Ser: 9.38 mg/dL — ABNORMAL HIGH (ref 0.61–1.24)
GFR, Estimated: 7 mL/min — ABNORMAL LOW (ref >60)
Glucose, Bld: 98 mg/dL (ref 70–99)
Phosphorus: 4.8 mg/dL — ABNORMAL HIGH (ref 2.5–4.6)
Potassium: 4 mmol/L (ref 3.5–5.1)
Sodium: 135 mmol/L (ref 135–145)

## 2023-06-03 LAB — CBC
HCT: 30.7 % — ABNORMAL LOW (ref 39.0–52.0)
Hemoglobin: 9.4 g/dL — ABNORMAL LOW (ref 13.0–17.0)
MCH: 30.6 pg (ref 26.0–34.0)
MCHC: 30.6 g/dL (ref 30.0–36.0)
MCV: 100 fL (ref 80.0–100.0)
Platelets: 349 10*3/uL (ref 150–400)
RBC: 3.07 MIL/uL — ABNORMAL LOW (ref 4.22–5.81)
RDW: 15.7 % — ABNORMAL HIGH (ref 11.5–15.5)
WBC: 6.1 10*3/uL (ref 4.0–10.5)
nRBC: 0 % (ref 0.0–0.2)

## 2023-06-03 MED ORDER — DIPHENHYDRAMINE HCL 25 MG PO CAPS
25.0000 mg | ORAL_CAPSULE | Freq: Four times a day (QID) | ORAL | Status: DC | PRN
Start: 2023-06-03 — End: 2023-06-20

## 2023-06-03 MED ORDER — ONDANSETRON HCL 4 MG/2ML IJ SOLN
4.0000 mg | Freq: Four times a day (QID) | INTRAMUSCULAR | Status: DC | PRN
  Administered 2023-06-04 – 2023-06-16 (×33): 4 mg via INTRAVENOUS
  Filled 2023-06-03 (×35): qty 2

## 2023-06-03 MED ORDER — CEFAZOLIN SODIUM-DEXTROSE 2-4 GM/100ML-% IV SOLN
2.0000 g | INTRAVENOUS | Status: DC
  Administered 2023-06-03 – 2023-06-12 (×4): 2 g via INTRAVENOUS
  Filled 2023-06-03 (×4): qty 100

## 2023-06-03 MED ORDER — UREA 10 % EX LOTN
TOPICAL_LOTION | Freq: Every day | CUTANEOUS | Status: DC
  Administered 2023-06-04 – 2023-06-06 (×2): 1 via TOPICAL

## 2023-06-03 MED ORDER — APIXABAN 5 MG PO TABS: 5.0000 mg | ORAL_TABLET | Freq: Two times a day (BID) | ORAL | Status: DC

## 2023-06-03 MED ORDER — MEDIHONEY WOUND/BURN DRESSING EX PSTE
1.0000 | PASTE | Freq: Every day | CUTANEOUS | Status: DC
  Administered 2023-06-04 – 2023-06-15 (×11): 1 via TOPICAL

## 2023-06-03 MED ORDER — HYDROMORPHONE HCL 2 MG PO TABS
1.0000 mg | ORAL_TABLET | ORAL | Status: DC | PRN
  Administered 2023-06-03: 1 mg via ORAL
  Filled 2023-06-03: qty 1

## 2023-06-03 MED ORDER — ONDANSETRON HCL 4 MG/2ML IJ SOLN: 4.0000 mg | Freq: Four times a day (QID) | INTRAMUSCULAR | Status: DC | PRN

## 2023-06-03 MED ORDER — DARBEPOETIN ALFA 150 MCG/0.3ML IJ SOSY
150.0000 ug | PREFILLED_SYRINGE | INTRAMUSCULAR | Status: DC
  Administered 2023-06-04: 150 ug via SUBCUTANEOUS
  Filled 2023-06-03 (×2): qty 0.3

## 2023-06-03 MED ORDER — DIGOXIN 125 MCG PO TABS
0.1250 mg | ORAL_TABLET | ORAL | Status: DC
  Administered 2023-06-06 – 2023-06-20 (×6): 0.125 mg via ORAL
  Filled 2023-06-03 (×7): qty 1

## 2023-06-03 MED ORDER — OXYCODONE HCL 5 MG PO TABS
15.0000 mg | ORAL_TABLET | ORAL | Status: DC | PRN
  Administered 2023-06-03 – 2023-06-20 (×40): 15 mg via ORAL
  Filled 2023-06-03 (×46): qty 3

## 2023-06-03 MED ORDER — HYDROMORPHONE HCL 2 MG PO TABS: 1.0000 mg | ORAL_TABLET | ORAL | 0 refills | Status: DC | PRN

## 2023-06-03 MED ORDER — METHOCARBAMOL 500 MG PO TABS
500.0000 mg | ORAL_TABLET | Freq: Four times a day (QID) | ORAL | Status: DC | PRN
  Administered 2023-06-04 – 2023-06-17 (×4): 500 mg via ORAL
  Filled 2023-06-03 (×7): qty 1

## 2023-06-03 MED ORDER — ALTEPLASE 2 MG IJ SOLR: 2.0000 mg | Freq: Once | INTRAMUSCULAR | Status: DC | PRN

## 2023-06-03 MED ORDER — GERHARDT'S BUTT CREAM
TOPICAL_CREAM | Freq: Three times a day (TID) | CUTANEOUS | Status: DC
  Filled 2023-06-03 (×2): qty 1

## 2023-06-03 MED ORDER — GABAPENTIN 300 MG PO CAPS
300.0000 mg | ORAL_CAPSULE | ORAL | Status: DC
  Administered 2023-06-06 – 2023-06-11 (×3): 300 mg via ORAL
  Filled 2023-06-03 (×4): qty 1

## 2023-06-03 MED ORDER — MIDODRINE HCL 5 MG PO TABS
30.0000 mg | ORAL_TABLET | Freq: Three times a day (TID) | ORAL | Status: DC
Start: 2023-06-03 — End: 2023-06-20
  Administered 2023-06-03 – 2023-06-20 (×43): 30 mg via ORAL
  Filled 2023-06-03 (×49): qty 6

## 2023-06-03 MED ORDER — MELATONIN 5 MG PO TABS: 5.0000 mg | ORAL_TABLET | Freq: Every evening | ORAL | Status: DC | PRN

## 2023-06-03 MED ORDER — ACETAMINOPHEN 325 MG PO TABS
325.0000 mg | ORAL_TABLET | ORAL | Status: DC | PRN
  Administered 2023-06-12 – 2023-06-19 (×2): 650 mg via ORAL
  Filled 2023-06-03 (×3): qty 2

## 2023-06-03 MED ORDER — DARBEPOETIN ALFA 150 MCG/0.3ML IJ SOSY: 150.0000 ug | PREFILLED_SYRINGE | INTRAMUSCULAR | Status: DC

## 2023-06-03 MED ORDER — ANTICOAGULANT SODIUM CITRATE 4% (200MG/5ML) IV SOLN
5.0000 mL | Status: DC | PRN
  Filled 2023-06-03: qty 5

## 2023-06-03 MED ORDER — CHLORHEXIDINE GLUCONATE CLOTH 2 % EX PADS
6.0000 | MEDICATED_PAD | Freq: Every day | CUTANEOUS | Status: DC
  Administered 2023-06-04: 6 via TOPICAL

## 2023-06-03 MED ORDER — CEFAZOLIN SODIUM-DEXTROSE 2-4 GM/100ML-% IV SOLN: 2.0000 g | INTRAVENOUS | Status: DC

## 2023-06-03 MED ORDER — POLYETHYLENE GLYCOL 3350 17 G PO PACK
17.0000 g | PACK | Freq: Every day | ORAL | Status: DC | PRN
  Filled 2023-06-03: qty 1

## 2023-06-03 MED ORDER — LANTHANUM CARBONATE 500 MG PO CHEW
2000.0000 mg | CHEWABLE_TABLET | Freq: Three times a day (TID) | ORAL | Status: DC
  Administered 2023-06-16: 2000 mg via ORAL
  Filled 2023-06-03 (×52): qty 4

## 2023-06-03 MED ORDER — NEPRO/CARBSTEADY PO LIQD
237.0000 mL | Freq: Two times a day (BID) | ORAL | Status: DC
  Administered 2023-06-08 – 2023-06-10 (×5): 237 mL via ORAL

## 2023-06-03 MED ORDER — GUAIFENESIN-DM 100-10 MG/5ML PO SYRP: 10.0000 mL | ORAL_SOLUTION | Freq: Four times a day (QID) | ORAL | Status: DC | PRN

## 2023-06-03 MED ORDER — LEVOTHYROXINE SODIUM 25 MCG PO TABS
25.0000 ug | ORAL_TABLET | Freq: Every day | ORAL | Status: DC
  Administered 2023-06-04 – 2023-06-20 (×17): 25 ug via ORAL
  Filled 2023-06-03 (×17): qty 1

## 2023-06-03 MED ORDER — HYDROMORPHONE HCL 2 MG PO TABS: 1.0000 mg | ORAL_TABLET | ORAL | Status: DC | PRN

## 2023-06-03 MED ORDER — APIXABAN 5 MG PO TABS
5.0000 mg | ORAL_TABLET | Freq: Two times a day (BID) | ORAL | Status: DC
  Administered 2023-06-03 – 2023-06-20 (×33): 5 mg via ORAL
  Filled 2023-06-03 (×34): qty 1

## 2023-06-03 NOTE — Plan of Care (Addendum)
Wound Plan    Wounds present:  05/18/23 Buttocks Right Stage 3 - Full thickness tissue loss.   Full thickness low mid back   Buttocks Left Stage 2 - Partial thickness loss of dermis presenting as a shallow open injury with a red, pink wound bed   without slough.   Intertriginous dermatitis underneath pannus   Interventions:  1. Apply Gerhardt's Butt Cream to buttocks, upper thighs, groin 3 times a day and prn soiling.   2. L hip healing Stage 2 cover with silicone foam. Lift foam daily to assess.  Change foam every days and as needed for soiling.   3.Clean underneath pannus with soap and water, dry and apply Order [facility] [place] # [phone]     Measure and cut length of InterDry to fit in skin folds that have skin breakdown   4. Clean R buttocks and R posterior thigh wounds (those with yellow tissue) with NS, apply Medihoney to wound beds daily, cover with dry gauze and silicone foam.   5. Urea lotion to bilateral feet daily   Renal diet 1200 CC FR; eating 75-100% of meals Nepro carb steady BID tween meals  Braden Score: 16 Sensory: 3 Moisture: 3 Activity: 3 Mobility: 3 Nutrition: 3 Friction: 1  Contributors: [name] Bullins MSN, RN-BC, CWOCN  Chana Bode, RN-BC, CRRN

## 2023-06-03 NOTE — H&P (Incomplete)
Physical Medicine and Rehabilitation Admission H&P   CC: Functional deficits secondary to   HPI: Phillip Young is a 37 year old male presented to the emergency department 05/18/2023 complaining of leg swelling and pain.  He has a history of end-stage renal disease on chronic intermittent hemodialysis on Mondays, Wednesdays and Fridays.  He stated he had missed hemodialysis treatment for 1 week because of transportation issues and unable to get out of his house.  He has a history of chronic hypoxic respiratory failure chronic oxygen therapy of 5 L.  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 Shirlee Latch.  He is maintained on Eliquis.  He was admitted to [facility] health internal medicine residency service.  Nephrology consulted for hemodialysis management.  Recently hospitalized over the summer with a prolonged hospital stay and discharged to [facility].  He was then discharged to [facility] rehab on 10/02 and discharged from there on 10/18.  Developed fever to 103 on 10/29 accompanied by hypotension, malaise.  EKG consistent with atrial fibrillation with RVR.  Sepsis workup initiated and started on broad-spectrum antibiotics.  Cardiology consulted.  Transthoracic echocardiogram performed in July 2024 had an estimated EF of 20 to 25%.  Given gentle hydration and serial lactic acid determination.  Midodrine started 30 mg 3 times daily for blood pressure support.  Cardiology unable to utilize goal-directed medical therapy secondary to hypotension as well as his end-stage renal disease.  He is not a candidate for advanced therapies or ICD.  Tunneled dialysis catheter was removed 10/31 for line holiday.  Blood cultures positive for MSSA.  Echocardiogram updated 10/31 and left ventricular ejection fraction estimated at 30% with global hypokinesis.  ID consultation obtained on 11/01  and plan 6 weeks IV antibiotics. Patient is not a candidate for TEE per primary's discussion with Dr. Anne Fu given morbid obesity, 4 to 5 L oxygen requirement, on 30 mg 3 times daily midodrine, SBP. Pressure injury of left buttock addressed. TDC replaced 11/04.  Follow-up by cardiology 11/06.  The patient remains in atrial fibrillation with reasonably controlled rate on digoxin.  He is not a candidate for amiodarone or AV nodal ablation with PPM.  He has failed attempts at DCCV and will continue Eliquis.  Dr. Duke Salvia discussed with the patient that his heart rate elevation with exertion is due to deconditioning.  Continue digoxin.  Recommend following up within 1 month of discharge and continue to check digoxin levels at least every 3 months. Tolerating, but does not like renal diet.       Review of Systems  Constitutional:  Positive for fever. Negative for chills.  HENT:  Negative for hearing loss.        Chronic hoarseness since intubation  Respiratory:  Negative for cough.        SOB with exertion  Cardiovascular:  Negative for chest pain and leg swelling.  Gastrointestinal:  Negative for abdominal pain and vomiting.  Musculoskeletal:  Positive for back pain.       BLE LE neuropathic pain he describes from knees down  Neurological:  Negative for dizziness and headaches.  Psychiatric/Behavioral:  Negative for depression. The patient is not nervous/anxious.    Past Medical History:  Diagnosis Date   Acute on chronic respiratory failure with hypoxia ([name]) 04/21/2021   Acute on chronic systolic (congestive) heart failure ([name]) 02/26/2020   Amiodarone toxicity    Anemia  Atrial flutter ([name])    Biventricular congestive heart failure ([name])    Chronic hypoxemic respiratory failure ([name])    Class 3 severe obesity due to excess calories with serious comorbidity and body mass index (BMI) of 50.0 to 59.9 in adult [facility]) 02/26/2020   Essential hypertension 02/26/2020   GERD without esophagitis  02/26/2020   Hidradenitis suppurativa 02/26/2020   NICM (nonischemic cardiomyopathy) ([name])    Obesity hypoventilation syndrome ([name])    OSA (obstructive sleep apnea)    PAF (paroxysmal atrial fibrillation) ([name])    Pneumonia    Prediabetes 02/26/2020   Past Surgical History:  Procedure Laterality Date   ABSCESS DRAINAGE     AV FISTULA PLACEMENT Left 08/21/2021   Procedure: LEFT ARM ARTERIOVENOUS (AV) FISTULA.;  Surgeon: Nada Libman, MD;  Location: [facility] OR;  Service: Vascular;  Laterality: Left;   CARDIAC CATHETERIZATION     CARDIOVERSION N/A 10/09/2021   Procedure: CARDIOVERSION;  Surgeon: Laurey Morale, MD;  Location: [facility] ENDOSCOPY;  Service: Cardiovascular;  Laterality: N/A;   CARDIOVERSION N/A 05/28/2022   Procedure: CARDIOVERSION;  Surgeon: Laurey Morale, MD;  Location: [facility] ENDOSCOPY;  Service: Cardiovascular;  Laterality: N/A;   CARDIOVERSION N/A 06/07/2022   Procedure: CARDIOVERSION;  Surgeon: Laurey Morale, MD;  Location: [facility] ENDOSCOPY;  Service: Cardiovascular;  Laterality: N/A;   IR FLUORO GUIDE CV LINE RIGHT  03/10/2021   IR FLUORO GUIDE CV LINE RIGHT  04/22/2021   IR FLUORO GUIDE CV LINE RIGHT  08/20/2021   IR FLUORO GUIDE CV LINE RIGHT  03/01/2023   IR FLUORO GUIDE CV LINE RIGHT  03/09/2023   IR FLUORO GUIDE CV LINE RIGHT  05/30/2023   IR PTA VENOUS EXCEPT DIALYSIS CIRCUIT  03/09/2023   IR REMOVAL TUN CV CATH W/O FL  05/26/2023   IR REMOVE CV FIBRIN SHEATH  03/09/2023   IR US GUIDE VASC ACCESS RIGHT  03/10/2021   IR US GUIDE VASC ACCESS RIGHT  04/22/2021   IR US GUIDE VASC ACCESS RIGHT  05/30/2023   RIGHT HEART CATH N/A 03/06/2021   Procedure: RIGHT HEART CATH;  Surgeon: Dolores Patty, MD;  Location: [facility] INVASIVE CV LAB;  Service: Cardiovascular;  Laterality: N/A;   RIGHT/LEFT HEART CATH AND CORONARY ANGIOGRAPHY N/A 03/04/2020   Procedure: RIGHT/LEFT HEART CATH AND CORONARY ANGIOGRAPHY;  Surgeon: Laurey Morale, MD;  Location: [facility] INVASIVE CV LAB;  Service:  Cardiovascular;  Laterality: N/A;   TEE WITHOUT CARDIOVERSION N/A 05/05/2021   Procedure: TRANSESOPHAGEAL ECHOCARDIOGRAM (TEE);  Surgeon: Laurey Morale, MD;  Location: [facility] ENDOSCOPY;  Service: Cardiovascular;  Laterality: N/A;   TEMPORARY DIALYSIS CATHETER  03/06/2021   Procedure: TEMPORARY DIALYSIS CATHETER;  Surgeon: Dolores Patty, MD;  Location: [facility] INVASIVE CV LAB;  Service: Cardiovascular;;   Family History  Problem Relation Age of Onset   Heart disease Mother    Hypertension Mother    Pulmonary Hypertension Mother    Drug abuse Father        died due to Heroin overdose   Social History:  reports that he quit smoking about 5 years ago. His smoking use included cigarettes. He has been exposed to tobacco smoke. He has quit using smokeless tobacco. He reports that he does not drink alcohol and does not use drugs. 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     Darbepoetin Alfa (ARANESP) 200 MCG/0.4ML SOSY injection Inject 0.4 mLs (200 mcg total) into the skin every Saturday at 6 PM.     digoxin (LANOXIN) 0.125 MG tablet Take 1 tablet (0.125 mg total) by mouth 3 (three) times a week.     gabapentin (NEURONTIN) 300 MG capsule Take 300 mg by mouth daily. Mon., Wed, and Fri.     lanthanum (FOSRENOL) 1000 MG chewable tablet Chew 2,000 mg by mouth 3 (three) times daily with meals.     melatonin 5 MG TABS Take 1 tablet (5  mg total) by mouth at bedtime. 30 tablet 0   midodrine (PROAMATINE) 10 MG tablet Take 3 tablets (30 mg total) by mouth 3 (three) times daily with meals.     Mouthwashes (MOUTH RINSE) LIQD solution 15 mLs by Mouth Rinse route in the morning, at noon, in the evening, and at bedtime.     [DISCONTINUED] oxyCODONE (ROXICODONE) 15 MG immediate release tablet Take 1 tablet (15 mg total) by mouth every 4 (four) hours as needed for severe pain.     albumin human 25 % bottle Inject 100 mLs (25 g total) into the vein Every Tuesday,Thursday,and Saturday with dialysis. (Patient not taking: Reported on 05/18/2023)     anticoagulant sodium citrate 4 GM/100ML SOLN 5 mLs by Intracatheter route once for 1 dose.     haloperidol lactate (HALDOL) 5 MG/ML injection Inject 0.2 mLs (1 mg total) into the vein every 12 (twelve) hours as needed. (Patient not taking: Reported on 05/18/2023)     hydrocortisone cream 1 % Apply 1 Application topically 3 (three) times daily as needed for itching (minor skin irritation). (Patient not taking: Reported on 05/18/2023)        Home: Home Living Family/patient expects to be discharged to:: Private residence Living Arrangements: Parent Available Help at Discharge: Family Type of Home: House Home Access: Stairs to enter, Level entry (stairs at front, level entry at back) Home Layout: Able to live on main level with bedroom/bathroom, Two level Home Equipment: Wheelchair - manual, Hospital bed, Agricultural consultant (2 wheels), Other (comment) (hoyer lift and sling) Additional Comments: Recieving HH nursing care following return home from SNF rehab.   Functional History: Prior Function Mobility Comments: Prior to admission in June 2024 pt was independent with mobility, since d/c from [facility] & return home pt was able to complete STS with supervision (easier time from elevated surfaces), step pivot bed>w/c but when he started missing dialysis pt was limited to bed mobility, using bed  pan. ADLs Comments: sponge baths 2/2 dialysis port, started using bed pan at home when he started missing dialysis & BLE began to swell  Functional Status:  Mobility: Bed Mobility Overal bed mobility: Needs Assistance Bed Mobility: Supine to Sit Rolling: Supervision Supine to sit: Supervision Sit to supine: Contact guard assist General bed mobility comments: No assist needed to get to EOB Transfers Overall transfer level: Needs assistance Equipment used: Rolling walker (2 wheels) Transfers: Sit to/from Stand Sit to Stand: Contact guard  assist, From elevated surface Bed to/from chair/wheelchair/BSC transfer type:: Step pivot Step pivot transfers: +2 safety/equipment, From elevated surface, Contact guard assist General transfer comment: CGA from EOB at elevated height x 5 reps Ambulation/Gait Ambulation/Gait assistance: +2 safety/equipment, Contact guard assist Gait Distance (Feet): 8 Feet (8 feet x 2) Assistive device: Rolling walker (2 wheels) Gait Pattern/deviations: Step-to pattern, Decreased stride length, Trunk flexed, Wide base of support General Gait Details: 4' fwd then back with RW and A for safety on 4L O2 with HR up to 152; wearing cam boot as the AFO that ortho techs brought will not fit into pts shoe or croc.  no assist needed.  Limited by HR and anxiety.  Side stepped to [facility] prior to sitting back down. Gait velocity interpretation: <1.31 ft/sec, indicative of household ambulator Pre-gait activities: ~2-3 ft forward/backward steps x2 reps, pt defer longer distance due to c/o pain and pt notably tachy while standing (HR ~141 bpm) due to pain.    ADL: ADL Overall ADL's : Needs assistance/impaired Eating/Feeding: Independent, Sitting, Bed level Grooming: Oral care, Set up, Sitting Grooming Details (indicate cue type and reason): EOB Upper Body Bathing: Sitting, Set up Lower Body Bathing: Maximal assistance, Sitting/lateral leans Lower Body Bathing Details (indicate cue  type and reason): Max A to apply lotion to BLEs, he can assist by applying it to his shins but not his feet. Upper Body Dressing : Sitting, Minimal assistance Lower Body Dressing: Total assistance, Bed level Lower Body Dressing Details (indicate cue type and reason): to don RLE boot and L shoe, predominantly secondary to pain Toilet Transfer: Minimal assistance, Ambulation, Rolling walker (2 wheels), BSC/3in1 Toilet Transfer Details (indicate cue type and reason): ~10 ft Toileting - Clothing Manipulation Details (indicate cue type and reason): declined OOB Functional mobility during ADLs: Minimal assistance, Rolling walker (2 wheels) General ADL Comments: intermittent min A for standing.  Cognition: Cognition Overall Cognitive Status: Within Functional Limits for tasks assessed Orientation Level: Oriented X4 Cognition Arousal: Alert Behavior During Therapy: WFL for tasks assessed/performed Overall Cognitive Status: Within Functional Limits for tasks assessed General Comments: needed encouragement to participate in session, limited by anxiety upon standing  Physical Exam: Blood pressure (!) 102/57, pulse 97, temperature 98.2 F (36.8 C), temperature source Oral, resp. rate 20, height 6' (1.829 m), weight (!) 157.4 kg, SpO2 99%. Physical Exam Constitutional:      General: He is not in acute distress. HENT:     Head: Normocephalic and atraumatic.  Eyes:     Extraocular Movements: Extraocular movements intact.     Pupils: Pupils are equal, round, and reactive to light.  Cardiovascular:     Rate and Rhythm: Normal rate. Rhythm irregular.  Abdominal:     General: Bowel sounds are normal.     Palpations: Abdomen is soft.  Skin:    General: Skin is warm and dry.  Neurological:     Mental Status: He is alert and oriented to person, place, and time.     Results for orders placed or performed during the hospital encounter of 05/18/23 (from the past 48 hour(s))  Renal function panel      Status: Abnormal   Collection Time: 06/02/23  4:45 AM  Result Value Ref Range   Sodium 135 135 - 145 mmol/L   Potassium 3.9 3.5 - 5.1 mmol/L   Chloride 98 98 - 111 mmol/L   CO2 26 22 - 32 mmol/L   Glucose, Bld 87 70 - 99 mg/dL    Comment: Glucose  reference range applies only to samples taken after fasting for at least 8 hours.   BUN 18 6 - 20 mg/dL   Creatinine, Ser 6.30 (H) 0.61 - 1.24 mg/dL   Calcium 9.9 8.9 - 16.0 mg/dL   Phosphorus 4.7 (H) 2.5 - 4.6 mg/dL   Albumin 2.7 (L) 3.5 - 5.0 g/dL   GFR, Estimated 8 (L) >60 mL/min    Comment: (NOTE) Calculated using the CKD-EPI Creatinine Equation (2021)    Anion gap 11 5 - 15    Comment: Performed at [facility], [address] [place], [place] [address]  Renal function panel     Status: Abnormal   Collection Time: 06/03/23  4:19 AM  Result Value Ref Range   Sodium 135 135 - 145 mmol/L   Potassium 4.0 3.5 - 5.1 mmol/L   Chloride 98 98 - 111 mmol/L   CO2 23 22 - 32 mmol/L   Glucose, Bld 98 70 - 99 mg/dL    Comment: Glucose reference range applies only to samples taken after fasting for at least 8 hours.   BUN 26 (H) 6 - 20 mg/dL   Creatinine, Ser 3.55 (H) 0.61 - 1.24 mg/dL   Calcium 73.2 (H) 8.9 - 10.3 mg/dL   Phosphorus 4.8 (H) 2.5 - 4.6 mg/dL   Albumin 2.7 (L) 3.5 - 5.0 g/dL   GFR, Estimated 7 (L) >60 mL/min    Comment: (NOTE) Calculated using the CKD-EPI Creatinine Equation (2021)    Anion gap 14 5 - 15    Comment: Performed at [facility], [address] [place], [place] [address]  CBC     Status: Abnormal   Collection Time: 06/03/23  8:06 AM  Result Value Ref Range   WBC 6.1 4.0 - 10.5 K/uL   RBC 3.07 (L) 4.22 - 5.81 MIL/uL   Hemoglobin 9.4 (L) 13.0 - 17.0 g/dL   HCT 27.0 (L) 62.3 - 76.2 %   MCV 100.0 80.0 - 100.0 fL   MCH 30.6 26.0 - 34.0 pg   MCHC 30.6 30.0 - 36.0 g/dL   RDW 83.1 (H) 51.7 - 61.6 %   Platelets 349 150 - 400 K/uL   nRBC 0.0 0.0 - 0.2 %    Comment: Performed at [facility]  Lab, [address], [place], [place] [address]   No results found.    Blood pressure (!) 102/57, pulse 97, temperature 98.2 F (36.8 C), temperature source Oral, resp. rate 20, height 6' (1.829 m), weight (!) 157.4 kg, SpO2 99%.  Medical Problem List and Plan: 1. Functional deficits secondary to ***  -patient may *** shower  -ELOS/Goals: ***  2.  Antithrombotics: -DVT/anticoagulation:  Pharmaceutical: Eliquis 5 mg BID  -antiplatelet therapy: none  3. Pain Management: Tylenol as needed  -PO Dilaudid initiated on acute side due to nausea with oxycodone   4. Mood/Behavior/Sleep: LCSW to evaluate and provide emotional support  -continue melatonin 5 mg q HS  -antipsychotic agents: n/a  5. Neuropsych/cognition: This patient is capable of making decisions on his own behalf.  6. Skin/Wound Care: Routine skin care checks  -pressure injury left buttock/posterior thigh>>continue wound care   7. Fluids/Electrolytes/Nutrition: Routine Is and Os and follow-up chemistries -Renal diet with 1200 cc fluid restriction  -Continue Fosrenol 2000 mg 3 times daily with meals  8: Hypotension: monitor TID and prn  -continue midodrine 30 mg 3 times daily with meals  9: pAF: on Eliquis   -unable to tolerate amiodarone (requires  midodrine for BP so not a candidate for nodal agents)  10: Hypothyroidism: Continue Synthroid 25 mcg daily  11: Chronic systolic heart failure: EF ~30-35%  -continue digoxin 0.125 mg  3 x weekly  -daily weight  -volume status>>HD 3 x weekly  12: Chronic hypoxic respiratory failure: on home O2 5 L via [place]  13: LE neuropathy/foot drop: CAM boot  14: Obesity: BMI 46.52  15: Anemia: on ESP  16: History or HIT positivity>>no heparin  17: MSSA bacteremia: will continue Ancef 2 g with HD treatment 3x/week (through 12/09)   ***  Milinda Antis, PA-C 06/03/2023

## 2023-06-03 NOTE — Plan of Care (Signed)

## 2023-06-03 NOTE — Progress Notes (Signed)
Patient transferred to [phone] with all belongings. RN at bedside to receive patient.

## 2023-06-03 NOTE — Progress Notes (Signed)
Pt to d/c to CIR today. Contacted [facility] this week and today to be advised that pt's insurance termed on 10/31 per hospital staff. Clinic advised that per hospital staff, pt has a pending medicaid application and pt/pt's mother are checking on possible medicare benefits. Clinic staff state pt can resume at clinic after rehab and that insurance coordinator at clinic can meet with pt upon return to clinic regarding options should issue not be resolved by then. Will assist as needed.   Olivia Canter Renal Navigator [phone]

## 2023-06-03 NOTE — Progress Notes (Signed)
Report called to RN on [address]

## 2023-06-03 NOTE — TOC Transition Note (Addendum)
Transition of Care [facility]) - CM/SW Discharge Note   Patient Details  Name: Phillip Young MRN: [id number] Date of Birth: [birth year]  [facility] CM/SW Contact:  Lockie Pares, RN Phone Number: 06/03/2023, 12:40 PM   Clinical Narrative:       Final next level of care: IP Rehab Facility Barriers to Discharge: No Barriers Identified   Patient Goals and CMS Choice CMS Medicare.gov Compare Post Acute Care list provided to:: Patient Choice offered to / list presented to : Patient, Parent (Mother, Maurine Minister)  Discharge Placement    Spoke with patient regarding home discharge after IP rehab and transportation. He has transportation to and from dialysis, the problem has been getting to the car. He now has a wheelchair at home and hoyer lift. He verbalizes anger at how things have turned out and that he has foot drop and bedsores. Used [employer] and gave the patient the patient advocate number to call and discuss his complaints Communicated with team the outcome of call and  that patient will have transportation when DC from CIR  Ill likely stay with his mother after discharge from CIR at [facility] [address]  [place]   Patient to be transferred to facility by: [facility] Additional resources added to the After Visit Summary for      To CIR today            DME Arranged:  (Has DME at home wheelchair, hoyer) DME Agency: NA       HH Arranged: PT, RN, Social Work [employer] Agency: [facility] Date HH Agency Contacted: 05/24/23 Time HH Agency Contacted: [phone] Representative spoke with at [facility] Agency: Elnita Maxwell  Social Determinants of Health (SDOH) Interventions SDOH Screenings   Food Insecurity: No Food Insecurity (05/18/2023)  Housing: Low Risk  (05/18/2023)  Transportation Needs: No Transportation Needs (05/18/2023)  Utilities: Not At Risk (05/18/2023)  Alcohol Screen: Low Risk  (09/21/2022)  Depression (PHQ2-9): High Risk  (09/21/2022)  Financial Resource Strain: Low Risk  (03/24/2023)   Received from [facility]  Physical Activity: Inactive (09/21/2022)  Social Connections: Moderately Isolated (03/24/2023)   Received from [facility]  Stress: No Stress Concern Present (04/21/2023)   Received from [facility]  Tobacco Use: Medium Risk (05/18/2023)     Readmission Risk Interventions    05/24/2023    4:43 PM 03/23/2023    3:48 PM 06/09/2022   12:19 PM  Readmission Risk Prevention Plan  Transportation Screening Complete Complete Complete  PCP or Specialist Appt within 5-7 Days Complete    Home Care Screening Complete    Medication Review (RN CM) Referral to Pharmacy    Medication Review (RN Care Manager)  Complete Complete  PCP or Specialist appointment within 3-5 days of discharge  -- Complete  HRI or Home Care Consult  Complete Complete  SW Recovery Care/Counseling Consult  Complete Complete  Palliative Care Screening  Not Applicable Not Applicable  Skilled Nursing Facility  Not Applicable Not Applicable

## 2023-06-03 NOTE — PMR Pre-admission (Signed)
PMR Admission Coordinator Pre-Admission Assessment  Patient: Phillip Young is an 37 y.o., male MRN: [id number] DOB: 08-19-1985 Height: 6' (182.9 cm) Weight: (!) 157.4 kg  Insurance Information HMO:     PPO:      PCP:      IPA:      80/20:      OTHER:  PRIMARY: Medicare A/B and Medicaid pending      Policy#:       Subscriber:  CM Name:       Phone#:      Fax#:  Pre-Cert#:       Employer:  Benefits:  Phone #:      Name:  Eff. Date:      Deduct:       Out of Pocket Max:       Life Max:  CIR:       SNF:  Outpatient:      Co-Pay:  Home Health:       Co-Pay:  DME:      Co-Pay:  Providers:  SECONDARY:       Policy#:      Phone#:   Artist:       Phone#:   The Data processing manager" for patients in Inpatient Rehabilitation Facilities with attached "Privacy Act Statement-Health Care Records" was provided and verbally reviewed with: Patient and Family  Emergency Contact Information Contact Information     Name Relation Home Work Mobile   Lookingglass Mother   [phone]      Other Contacts     Name Relation Home Work Mobile   Bonnie,Wendy Sister   [phone]       Current Medical History  Patient Admitting Diagnosis: debility   History of Present Illness: Pt is a 37 y/o male with PMH of obesity, OSA on home Bipap, chronic systolic heart failure 2/2 NICM, afib, and ESRD on HD MWF, admitted to [facility] on 05/18/23 due to swelling and pain after missing 3 HD treatments due to transportation issues.  Was initially admitted for observation for HD but developed fevers and went into afib with RVR and was ultimately admitted to inpatient with sepsis and MSSA bacteremia.  He underwent a line holiday and replaced TDC on 11/4 to resume HD on MWF schedule.  TTE without vegetations and IV abx started to end 07/06/23.  He has significant decline in his cardiopulmonary endurance with HR up to 150's with therapy.  He requires 5L o2 at baseline.  Therapy evaluations  are completed and he was recommended for CIR.      Patient's medical record from [facility] has been reviewed by the rehabilitation admission coordinator and physician.  Past Medical History  Past Medical History:  Diagnosis Date   Acute on chronic respiratory failure with hypoxia ([name]) 04/21/2021   Acute on chronic systolic (congestive) heart failure ([name]) 02/26/2020   Amiodarone toxicity    Anemia    Atrial flutter ([name])    Biventricular congestive heart failure ([name])    Chronic hypoxemic respiratory failure ([name])    Class 3 severe obesity due to excess calories with serious comorbidity and body mass index (BMI) of 50.0 to 59.9 in adult [facility]) 02/26/2020   Essential hypertension 02/26/2020   GERD without esophagitis 02/26/2020   Hidradenitis suppurativa 02/26/2020   NICM (nonischemic cardiomyopathy) ([name])    Obesity hypoventilation syndrome ([name])    OSA (obstructive sleep apnea)    PAF (paroxysmal atrial fibrillation) ([name])    Pneumonia    Prediabetes 02/26/2020  Has the patient had major surgery during 100 days prior to admission? No  Family History   family history includes Drug abuse in his father; Heart disease in his mother; Hypertension in his mother; Pulmonary Hypertension in his mother.  Current Medications  Current Facility-Administered Medications:    acetaminophen (TYLENOL) tablet 650 mg, 650 mg, Oral, Q6H PRN, 650 mg at 05/25/23 0142 **OR** acetaminophen (TYLENOL) suppository 650 mg, 650 mg, Rectal, Q6H PRN, Atway, Rayann N, DO   alteplase (CATHFLO ACTIVASE) injection 2 mg, 2 mg, Intracatheter, Once PRN, Thomasena Edis, Samantha G, PA-C   anticoagulant sodium citrate solution 5 mL, 5 mL, Intracatheter, PRN, Thomasena Edis, Samantha G, PA-C   apixaban (ELIQUIS) tablet 5 mg, 5 mg, Oral, BID, Juberg, Christopher, DO, 5 mg at 06/02/23 2133   ceFAZolin (ANCEF) IVPB 2g/100 mL premix, 2 g, Intravenous, Q M,W,F-HD, Weingarten, Rachael I, RPH, Stopped at 06/01/23 1250   Chlorhexidine  Gluconate Cloth 2 % PADS 6 each, 6 each, Topical, Q0600, Collins, Quenten Raven, PA-C, 6 each at 06/03/23 0530   Darbepoetin Alfa (ARANESP) injection 150 mcg, 150 mcg, Subcutaneous, Q Sat-1800, Salome Holmes E, NP, 150 mcg at 05/28/23 2014   digoxin (LANOXIN) tablet 0.125 mg, 0.125 mg, Oral, Once per day on Monday Wednesday Friday, Atway, Rayann N, DO, 0.125 mg at 06/01/23 1409   feeding supplement (NEPRO CARB STEADY) liquid 237 mL, 237 mL, Oral, BID BM, Julien Nordmann, PA-C, 237 mL at 06/02/23 0825   gabapentin (NEURONTIN) capsule 300 mg, 300 mg, Oral, Q M,W,F-HD, Atway, Rayann N, DO, 300 mg at 06/01/23 1410   Gerhardt's butt cream, , Topical, TID, Inez Catalina, MD, Given at 06/02/23 0825   HYDROmorphone (DILAUDID) tablet 1 mg, 1 mg, Oral, Q4H PRN, Juberg, Christopher, DO   lanthanum (FOSRENOL) chewable tablet 2,000 mg, 2,000 mg, Oral, TID WC, Atway, Rayann N, DO, 2,000 mg at 05/28/23 1817   leptospermum manuka honey (MEDIHONEY) paste 1 Application, 1 Application, Topical, Daily, Debe Coder B, MD, 1 Application at 06/02/23 0824   levothyroxine (SYNTHROID) tablet 25 mcg, 25 mcg, Oral, Q0600, [name], DO, 25 mcg at 06/03/23 0523   midodrine (PROAMATINE) tablet 30 mg, 30 mg, Oral, TID WC, Atway, Rayann N, DO, 30 mg at 06/02/23 1756   ondansetron (ZOFRAN) injection 4 mg, 4 mg, Intravenous, Q6H PRN, Maxie Barb, MD, 4 mg at 06/02/23 1731   polyethylene glycol (MIRALAX / GLYCOLAX) packet 17 g, 17 g, Oral, Daily PRN, Marrianne Mood, MD   urea 10 % lotion, , Topical, Daily, Marrianne Mood, MD, Given at 05/29/23 1000  Patients Current Diet:  Diet Order             Diet renal with fluid restriction Fluid restriction: 1200 mL Fluid; Room service appropriate? Yes; Fluid consistency: Thin  Diet effective now           Diet - low sodium heart healthy                   Precautions / Restrictions Precautions Precautions: Fall Precaution Comments: R foot drop-  R CAM boot vs. shoe with AFO (shoe doesnt fit);  watch HR Restrictions Weight Bearing Restrictions: No   Has the patient had 2 or more falls or a fall with injury in the past year? No  Prior Activity Level Household: prior to June was independent, since hospitalization, LTACH, and rehab he has been supervision, at best, with significantly decreased CP endurance, no DME at baseline, now with RW.  Prior Functional  Level Self Care: Did the patient need help bathing, dressing, using the toilet or eating? Independent  Indoor Mobility: Did the patient need assistance with walking from room to room (with or without device)? Independent  Stairs: Did the patient need assistance with internal or external stairs (with or without device)? Needed some help  Functional Cognition: Did the patient need help planning regular tasks such as shopping or remembering to take medications? Needed some help  Patient Information Are you of Hispanic, Latino/a,or Spanish origin?: A. No, not of Hispanic, Latino/a, or Spanish origin What is your race?: B. Black or African American Do you need or want an interpreter to communicate with a doctor or health care staff?: 0. No  Patient's Response To:  Health Literacy and Transportation Is the patient able to respond to health literacy and transportation needs?: Yes Health Literacy - How often do you need to have someone help you when you read instructions, pamphlets, or other written material from your doctor or pharmacy?: Never In the past 12 months, has lack of transportation kept you from medical appointments or from getting medications?: Yes In the past 12 months, has lack of transportation kept you from meetings, work, or from getting things needed for daily living?: Yes  Journalist, newspaper / Equipment Home Equipment: Wheelchair - manual, Hospital bed, Agricultural consultant (2 wheels), Other (comment) (hoyer lift and sling)  Prior Device Use: Indicate devices/aids  used by the patient prior to current illness, exacerbation or injury? Walker and Orthotics/Prosthetics  Current Functional Level Cognition  Overall Cognitive Status: Within Functional Limits for tasks assessed Orientation Level: Oriented X4 General Comments: needed encouragement to participate in session, limited by anxiety upon standing    Extremity Assessment (includes Sensation/Coordination)  Upper Extremity Assessment: RUE deficits/detail RUE Deficits / Details: limited shoulder flexion, pain with PROM beyond 90*  and PROM is WNL but he can acheive ~80* AROM but pain persists (reports this occurred after hospitalization in June 2024) RUE: Shoulder pain with ROM RUE Sensation: WNL RUE Coordination: decreased gross motor  Lower Extremity Assessment: Defer to PT evaluation RLE Deficits / Details: R foot drop following hospitalization in June 2024    ADLs  Overall ADL's : Needs assistance/impaired Eating/Feeding: Independent, Sitting, Bed level Grooming: Oral care, Set up, Sitting Grooming Details (indicate cue type and reason): EOB Upper Body Bathing: Sitting, Set up Lower Body Bathing: Maximal assistance, Sitting/lateral leans Lower Body Bathing Details (indicate cue type and reason): Max A to apply lotion to BLEs, he can assist by applying it to his shins but not his feet. Upper Body Dressing : Sitting, Minimal assistance Lower Body Dressing: Total assistance, Bed level Lower Body Dressing Details (indicate cue type and reason): to don RLE boot and L shoe, predominantly secondary to pain Toilet Transfer: Minimal assistance, Ambulation, Rolling walker (2 wheels), BSC/3in1 Toilet Transfer Details (indicate cue type and reason): ~10 ft Toileting - Clothing Manipulation Details (indicate cue type and reason): declined OOB Functional mobility during ADLs: Minimal assistance, Rolling walker (2 wheels) General ADL Comments: intermittent min A for standing.    Mobility  Overal bed  mobility: Needs Assistance Bed Mobility: Supine to Sit Rolling: Supervision Supine to sit: Supervision Sit to supine: Contact guard assist General bed mobility comments: No assist needed to get to EOB    Transfers  Overall transfer level: Needs assistance Equipment used: Rolling walker (2 wheels) Transfers: Sit to/from Stand Sit to Stand: Contact guard assist, From elevated surface Bed to/from chair/wheelchair/BSC transfer type:: Step pivot  Step pivot transfers: +2 safety/equipment, From elevated surface, Contact guard assist General transfer comment: CGA from EOB at elevated height x 5 reps    Ambulation / Gait / Stairs / Wheelchair Mobility  Ambulation/Gait Ambulation/Gait assistance: +2 safety/equipment, Contact guard assist Gait Distance (Feet): 8 Feet (8 feet x 2) Assistive device: Rolling walker (2 wheels) Gait Pattern/deviations: Step-to pattern, Decreased stride length, Trunk flexed, Wide base of support General Gait Details: 4' fwd then back with RW and A for safety on 4L O2 with HR up to 152; wearing cam boot as the AFO that ortho techs brought will not fit into pts shoe or croc.  no assist needed.  Limited by HR and anxiety.  Side stepped to [facility] prior to sitting back down. Gait velocity interpretation: <1.31 ft/sec, indicative of household ambulator Pre-gait activities: ~2-3 ft forward/backward steps x2 reps, pt defer longer distance due to c/o pain and pt notably tachy while standing (HR ~141 bpm) due to pain.    Posture / Balance Dynamic Sitting Balance Sitting balance - Comments: EOB with no UE support between all ambulation trials Balance Overall balance assessment: Needs assistance Sitting-balance support: Feet supported, No upper extremity supported Sitting balance-Leahy Scale: Good Sitting balance - Comments: EOB with no UE support between all ambulation trials Standing balance support: Bilateral upper extremity supported Standing balance-Leahy Scale:  Poor Standing balance comment: can stand without UE support, using walker for ambulation    Special needs/care consideration BiPAP, Dialysis: Hemodialysis Monday, Wednesday, and Friday, Oxygen 5L, Skin pressure injury to buttock prior to admit to acute hospital, and Diabetic management yes   Previous Home Environment (from acute therapy documentation) Living Arrangements: Parent Available Help at Discharge: Family Type of Home: House Home Layout: Able to live on main level with bedroom/bathroom, Two level Home Access: Stairs to enter, Level entry (stairs at front, level entry at back) Home Care Services: Yes Additional Comments: Recieving HH nursing care following return home from SNF rehab.  Discharge Living Setting Plans for Discharge Living Setting: Patient's home Type of Home at Discharge: House Discharge Home Layout: One level Discharge Home Access: Stairs to enter Entrance Stairs-Rails: Right, Left Entrance Stairs-Number of Steps: 4-5 Discharge Bathroom Shower/Tub: Tub/shower unit Discharge Bathroom Toilet: Handicapped height Discharge Bathroom Accessibility: Yes How Accessible: Accessible via walker Does the patient have any problems obtaining your medications?: No  Social/Family/Support Systems Anticipated Caregiver: mom, Yuxuan Spadaro, and sister Kerven Steinhardt Anticipated Caregiver's Contact Information: [name]  [phone] Ability/Limitations of Caregiver: supervision only Caregiver Availability: 24/7 Discharge Plan Discussed with Primary Caregiver: Yes Is Caregiver In Agreement with Plan?: Yes Does Caregiver/Family have Issues with Lodging/Transportation while Pt is in Rehab?: No  Goals Patient/Family Goal for Rehab: PT/OT supervision, SLP n/a Expected length of stay: 16-18 days Additional Information: Discharge plan: discharge to pt's residence ([address] in [place]), mom can provide supervision if needed.  Will need to arrange HD transportion.  Per Olivia Canter, pt's current HD clinic has an Contractor that will meet with them to help get things sorted if Medicare/Medicaid not worked out by then (which is a possibility).  Pt has a Medicaid application pending as of 10/25 per [facility] notes, and mom is working on getting his Medicare activated (should be eligible for part A as of 10/2032 and B 03/2023). Pt/Family Agrees to Admission and willing to participate: Yes Program Orientation Provided & Reviewed with Pt/Caregiver Including Roles  & Responsibilities: Yes Additional Information Needs: Need to continue to help mom try and locate  MBI number, and advocate for Medicaid application being expedited Information Needs to be Provided By: CSW team  Barriers to Discharge: Home environment access/layout, Hemodialysis  Decrease burden of Care through IP rehab admission: n/a  Possible need for SNF placement upon discharge: Not anticipated.  Plan for discharge home with mom able to provide supervision if needed.    Patient Condition: I have reviewed medical records from [facility], spoken with  [facility] , and patient and family member. I met with patient at the bedside and discussed via phone for inpatient rehabilitation assessment.  Patient will benefit from ongoing PT and OT, can actively participate in 3 hours of therapy a day 5 days of the week, and can make measurable gains during the admission.  Patient will also benefit from the coordinated team approach during an Inpatient Acute Rehabilitation admission.  The patient will receive intensive therapy as well as Rehabilitation physician, nursing, social worker, and care management interventions.  Due to safety, skin/wound care, disease management, medication administration, pain management, and patient education the patient requires 24 hour a day rehabilitation nursing.  The patient is currently CGA to min assist with mobility and basic ADLs.  Discharge setting and therapy post discharge at home with home health  is anticipated.  Patient has agreed to participate in the Acute Inpatient Rehabilitation Program and will admit today.  Preadmission Screen Completed By:  Stephania Fragmin, PT, DPT 06/03/2023 11:50 AM ______________________________________________________________________   Discussed status with Dr. Shearon Stalls  on 06/03/23  at 12:01 PM  and received approval for admission today.  Admission Coordinator:  Stephania Fragmin, PT, DPT time 12:01 PM Dorna Bloom 06/03/23    Assessment/Plan: Diagnosis: Does the need for close, 24 hr/day Medical supervision in concert with the patient's rehab needs make it unreasonable for this patient to be served in a less intensive setting? Yes Co-Morbidities requiring supervision/potential complications: MSSA bacteremia on IV antibiotics, wounds/skin breakdown, neuropathic pain, ESRD, HFREF, A fib, chronic respiratory failure, and hypotension.  Due to safety, skin/wound care, disease management, medication administration, pain management, and patient education, does the patient require 24 hr/day rehab nursing? Yes Does the patient require coordinated care of a physician, rehab nurse, PT, OT, and SLP to address physical and functional deficits in the context of the above medical diagnosis(es)? Yes Addressing deficits in the following areas: balance, endurance, locomotion, strength, transferring, bathing, dressing, feeding, grooming, and toileting Can the patient actively participate in an intensive therapy program of at least 3 hrs of therapy 5 days a week? Yes The potential for patient to make measurable gains while on inpatient rehab is good Anticipated functional outcomes upon discharge from inpatient rehab: supervision PT, supervision OT Estimated rehab length of stay to reach the above functional goals is: 16-18 days Anticipated discharge destination: Home 10. Overall Rehab/Functional Prognosis: good   MD Signature:  Angelina Sheriff, DO 06/03/2023

## 2023-06-03 NOTE — Progress Notes (Signed)
Angelina Sheriff, DO  Physician Physical Medicine and Rehabilitation   PMR Pre-admission    Signed   Date of Service: 06/03/2023 11:50 AM  Related encounter: ED to Hosp-Admission (Current) from 05/18/2023 in [place] [facility]   Signed     Expand All Collapse All  PMR Admission Coordinator Pre-Admission Assessment   Patient: Phillip Young is an 37 y.o., male MRN: [id number] DOB: Jan 22, 1986 Height: 6' (182.9 cm) Weight: (!) 157.4 kg   Insurance Information HMO:     PPO:      PCP:      IPA:      80/20:      OTHER:  PRIMARY: Medicare A/B and Medicaid pending      Policy#:       Subscriber:  CM Name:       Phone#:      Fax#:  Pre-Cert#:       Employer:  Benefits:  Phone #:      Name:  Eff. Date:      Deduct:       Out of Pocket Max:       Life Max:  CIR:       SNF:  Outpatient:      Co-Pay:  Home Health:       Co-Pay:  DME:      Co-Pay:  Providers:  SECONDARY:       Policy#:      Phone#:    Artist:       Phone#:    The Data processing manager" for patients in Inpatient Rehabilitation Facilities with attached "Privacy Act Statement-Health Care Records" was provided and verbally reviewed with: Patient and Family   Emergency Contact Information Contact Information       Name Relation Home Work Mobile    [place] Mother     [phone]         Other Contacts       Name Relation Home Work Mobile    Dedic,Wendy Sister     [phone]           Current Medical History  Patient Admitting Diagnosis: debility    History of Present Illness: Pt is a 37 y/o male with PMH of obesity, OSA on home Bipap, chronic systolic heart failure 2/2 NICM, afib, and ESRD on HD MWF, admitted to [facility] on 05/18/23 due to swelling and pain after missing 3 HD treatments due to transportation issues.  Was initially admitted for observation for HD but developed fevers and went into afib with RVR and was ultimately admitted to inpatient with  sepsis and MSSA bacteremia.  He underwent a line holiday and replaced TDC on 11/4 to resume HD on MWF schedule.  TTE without vegetations and IV abx started to end 07/06/23.  He has significant decline in his cardiopulmonary endurance with HR up to 150's with therapy.  He requires 5L o2 at baseline.  Therapy evaluations are completed and he was recommended for CIR.     Patient's medical record from [facility] has been reviewed by the rehabilitation admission coordinator and physician.   Past Medical History      Past Medical History:  Diagnosis Date   Acute on chronic respiratory failure with hypoxia ([name]) 04/21/2021   Acute on chronic systolic (congestive) heart failure ([name]) 02/26/2020   Amiodarone toxicity     Anemia     Atrial flutter ([name])     Biventricular congestive heart failure ([name])  Chronic hypoxemic respiratory failure ([name])     Class 3 severe obesity due to excess calories with serious comorbidity and body mass index (BMI) of 50.0 to 59.9 in adult [facility]) 02/26/2020   Essential hypertension 02/26/2020   GERD without esophagitis 02/26/2020   Hidradenitis suppurativa 02/26/2020   NICM (nonischemic cardiomyopathy) ([name])     Obesity hypoventilation syndrome ([name])     OSA (obstructive sleep apnea)     PAF (paroxysmal atrial fibrillation) ([name])     Pneumonia     Prediabetes 02/26/2020          Has the patient had major surgery during 100 days prior to admission? No   Family History   family history includes Drug abuse in his father; Heart disease in his mother; Hypertension in his mother; Pulmonary Hypertension in his mother.   Current Medications  Current Medications    Current Facility-Administered Medications:    acetaminophen (TYLENOL) tablet 650 mg, 650 mg, Oral, Q6H PRN, 650 mg at 05/25/23 0142 **OR** acetaminophen (TYLENOL) suppository 650 mg, 650 mg, Rectal, Q6H PRN, Atway, Rayann N, DO   alteplase (CATHFLO ACTIVASE) injection 2 mg, 2 mg, Intracatheter, Once  PRN, Thomasena Edis, Samantha G, PA-C   anticoagulant sodium citrate solution 5 mL, 5 mL, Intracatheter, PRN, Thomasena Edis, Samantha G, PA-C   apixaban (ELIQUIS) tablet 5 mg, 5 mg, Oral, BID, Juberg, Christopher, DO, 5 mg at 06/02/23 2133   ceFAZolin (ANCEF) IVPB 2g/100 mL premix, 2 g, Intravenous, Q M,W,F-HD, Weingarten, Rachael I, RPH, Stopped at 06/01/23 1250   Chlorhexidine Gluconate Cloth 2 % PADS 6 each, 6 each, Topical, Q0600, Collins, Quenten Raven, PA-C, 6 each at 06/03/23 0530   Darbepoetin Alfa (ARANESP) injection 150 mcg, 150 mcg, Subcutaneous, Q Sat-1800, Salome Holmes E, NP, 150 mcg at 05/28/23 2014   digoxin (LANOXIN) tablet 0.125 mg, 0.125 mg, Oral, Once per day on Monday Wednesday Friday, Atway, Rayann N, DO, 0.125 mg at 06/01/23 1409   feeding supplement (NEPRO CARB STEADY) liquid 237 mL, 237 mL, Oral, BID BM, Julien Nordmann, PA-C, 237 mL at 06/02/23 0825   gabapentin (NEURONTIN) capsule 300 mg, 300 mg, Oral, Q M,W,F-HD, Atway, Rayann N, DO, 300 mg at 06/01/23 1410   Gerhardt's butt cream, , Topical, TID, Inez Catalina, MD, Given at 06/02/23 0825   HYDROmorphone (DILAUDID) tablet 1 mg, 1 mg, Oral, Q4H PRN, Juberg, Christopher, DO   lanthanum (FOSRENOL) chewable tablet 2,000 mg, 2,000 mg, Oral, TID WC, Atway, Rayann N, DO, 2,000 mg at 05/28/23 1817   leptospermum manuka honey (MEDIHONEY) paste 1 Application, 1 Application, Topical, Daily, Debe Coder B, MD, 1 Application at 06/02/23 0824   levothyroxine (SYNTHROID) tablet 25 mcg, 25 mcg, Oral, Q0600, [name], DO, 25 mcg at 06/03/23 0523   midodrine (PROAMATINE) tablet 30 mg, 30 mg, Oral, TID WC, Atway, Rayann N, DO, 30 mg at 06/02/23 1756   ondansetron (ZOFRAN) injection 4 mg, 4 mg, Intravenous, Q6H PRN, Maxie Barb, MD, 4 mg at 06/02/23 1731   polyethylene glycol (MIRALAX / GLYCOLAX) packet 17 g, 17 g, Oral, Daily PRN, Marrianne Mood, MD   urea 10 % lotion, , Topical, Daily, Marrianne Mood, MD, Given at  05/29/23 1000     Patients Current Diet:  Diet Order                  Diet renal with fluid restriction Fluid restriction: 1200 mL Fluid; Room service appropriate? Yes; Fluid consistency: Thin  Diet effective now  Diet - low sodium heart healthy                         Precautions / Restrictions Precautions Precautions: Fall Precaution Comments: R foot drop- R CAM boot vs. shoe with AFO (shoe doesnt fit);  watch HR Restrictions Weight Bearing Restrictions: No    Has the patient had 2 or more falls or a fall with injury in the past year? No   Prior Activity Level Household: prior to June was independent, since hospitalization, LTACH, and rehab he has been supervision, at best, with significantly decreased CP endurance, no DME at baseline, now with RW.   Prior Functional Level Self Care: Did the patient need help bathing, dressing, using the toilet or eating? Independent   Indoor Mobility: Did the patient need assistance with walking from room to room (with or without device)? Independent   Stairs: Did the patient need assistance with internal or external stairs (with or without device)? Needed some help   Functional Cognition: Did the patient need help planning regular tasks such as shopping or remembering to take medications? Needed some help   Patient Information Are you of Hispanic, Latino/a,or Spanish origin?: A. No, not of Hispanic, Latino/a, or Spanish origin What is your race?: B. Black or African American Do you need or want an interpreter to communicate with a doctor or health care staff?: 0. No   Patient's Response To:  Health Literacy and Transportation Is the patient able to respond to health literacy and transportation needs?: Yes Health Literacy - How often do you need to have someone help you when you read instructions, pamphlets, or other written material from your doctor or pharmacy?: Never In the past 12 months, has lack of transportation  kept you from medical appointments or from getting medications?: Yes In the past 12 months, has lack of transportation kept you from meetings, work, or from getting things needed for daily living?: Yes   Journalist, newspaper / Equipment Home Equipment: Wheelchair - manual, Hospital bed, Agricultural consultant (2 wheels), Other (comment) (hoyer lift and sling)   Prior Device Use: Indicate devices/aids used by the patient prior to current illness, exacerbation or injury? Walker and Orthotics/Prosthetics   Current Functional Level Cognition   Overall Cognitive Status: Within Functional Limits for tasks assessed Orientation Level: Oriented X4 General Comments: needed encouragement to participate in session, limited by anxiety upon standing    Extremity Assessment (includes Sensation/Coordination)   Upper Extremity Assessment: RUE deficits/detail RUE Deficits / Details: limited shoulder flexion, pain with PROM beyond 90*  and PROM is WNL but he can acheive ~80* AROM but pain persists (reports this occurred after hospitalization in June 2024) RUE: Shoulder pain with ROM RUE Sensation: WNL RUE Coordination: decreased gross motor  Lower Extremity Assessment: Defer to PT evaluation RLE Deficits / Details: R foot drop following hospitalization in June 2024     ADLs   Overall ADL's : Needs assistance/impaired Eating/Feeding: Independent, Sitting, Bed level Grooming: Oral care, Set up, Sitting Grooming Details (indicate cue type and reason): EOB Upper Body Bathing: Sitting, Set up Lower Body Bathing: Maximal assistance, Sitting/lateral leans Lower Body Bathing Details (indicate cue type and reason): Max A to apply lotion to BLEs, he can assist by applying it to his shins but not his feet. Upper Body Dressing : Sitting, Minimal assistance Lower Body Dressing: Total assistance, Bed level Lower Body Dressing Details (indicate cue type and reason): to don RLE boot and L  shoe, predominantly secondary to  pain Toilet Transfer: Minimal assistance, Ambulation, Rolling walker (2 wheels), BSC/3in1 Toilet Transfer Details (indicate cue type and reason): ~10 ft Toileting - Clothing Manipulation Details (indicate cue type and reason): declined OOB Functional mobility during ADLs: Minimal assistance, Rolling walker (2 wheels) General ADL Comments: intermittent min A for standing.     Mobility   Overal bed mobility: Needs Assistance Bed Mobility: Supine to Sit Rolling: Supervision Supine to sit: Supervision Sit to supine: Contact guard assist General bed mobility comments: No assist needed to get to EOB     Transfers   Overall transfer level: Needs assistance Equipment used: Rolling walker (2 wheels) Transfers: Sit to/from Stand Sit to Stand: Contact guard assist, From elevated surface Bed to/from chair/wheelchair/BSC transfer type:: Step pivot Step pivot transfers: +2 safety/equipment, From elevated surface, Contact guard assist General transfer comment: CGA from EOB at elevated height x 5 reps     Ambulation / Gait / Stairs / Wheelchair Mobility   Ambulation/Gait Ambulation/Gait assistance: +2 safety/equipment, Contact guard assist Gait Distance (Feet): 8 Feet (8 feet x 2) Assistive device: Rolling walker (2 wheels) Gait Pattern/deviations: Step-to pattern, Decreased stride length, Trunk flexed, Wide base of support General Gait Details: 4' fwd then back with RW and A for safety on 4L O2 with HR up to 152; wearing cam boot as the AFO that ortho techs brought will not fit into pts shoe or croc.  no assist needed.  Limited by HR and anxiety.  Side stepped to [facility] prior to sitting back down. Gait velocity interpretation: <1.31 ft/sec, indicative of household ambulator Pre-gait activities: ~2-3 ft forward/backward steps x2 reps, pt defer longer distance due to c/o pain and pt notably tachy while standing (HR ~141 bpm) due to pain.     Posture / Balance Dynamic Sitting Balance Sitting balance -  Comments: EOB with no UE support between all ambulation trials Balance Overall balance assessment: Needs assistance Sitting-balance support: Feet supported, No upper extremity supported Sitting balance-Leahy Scale: Good Sitting balance - Comments: EOB with no UE support between all ambulation trials Standing balance support: Bilateral upper extremity supported Standing balance-Leahy Scale: Poor Standing balance comment: can stand without UE support, using walker for ambulation     Special needs/care consideration BiPAP, Dialysis: Hemodialysis Monday, Wednesday, and Friday, Oxygen 5L, Skin pressure injury to buttock prior to admit to acute hospital, and Diabetic management yes    Previous Home Environment (from acute therapy documentation) Living Arrangements: Parent Available Help at Discharge: Family Type of Home: House Home Layout: Able to live on main level with bedroom/bathroom, Two level Home Access: Stairs to enter, Level entry (stairs at front, level entry at back) Home Care Services: Yes Additional Comments: Recieving HH nursing care following return home from SNF rehab.   Discharge Living Setting Plans for Discharge Living Setting: Patient's home Type of Home at Discharge: House Discharge Home Layout: One level Discharge Home Access: Stairs to enter Entrance Stairs-Rails: Right, Left Entrance Stairs-Number of Steps: 4-5 Discharge Bathroom Shower/Tub: Tub/shower unit Discharge Bathroom Toilet: Handicapped height Discharge Bathroom Accessibility: Yes How Accessible: Accessible via walker Does the patient have any problems obtaining your medications?: No   Social/Family/Support Systems Anticipated Caregiver: mom, TRUE Cowdin, and sister Allanmichael Preiser Anticipated Caregiver's Contact Information: [name]  [phone] Ability/Limitations of Caregiver: supervision only Caregiver Availability: 24/7 Discharge Plan Discussed with Primary Caregiver: Yes Is Caregiver In  Agreement with Plan?: Yes Does Caregiver/Family have Issues with Lodging/Transportation while Pt is in Rehab?: No  Goals Patient/Family Goal for Rehab: PT/OT supervision, SLP n/a Expected length of stay: 16-18 days Additional Information: Discharge plan: discharge to pt's residence ([address] in [place]), mom can provide supervision if needed.  Will need to arrange HD transportion.  Per Olivia Canter, pt's current HD clinic has an Contractor that will meet with them to help get things sorted if Medicare/Medicaid not worked out by then (which is a possibility).  Pt has a Medicaid application pending as of 10/25 per [facility] notes, and mom is working on getting his Medicare activated (should be eligible for part A as of 10/2032 and B 03/2023). Pt/Family Agrees to Admission and willing to participate: Yes Program Orientation Provided & Reviewed with Pt/Caregiver Including Roles  & Responsibilities: Yes Additional Information Needs: Need to continue to help mom try and locate MBI number, and advocate for Medicaid application being expedited Information Needs to be Provided By: CSW team  Barriers to Discharge: Home environment access/layout, Hemodialysis   Decrease burden of Care through IP rehab admission: n/a   Possible need for SNF placement upon discharge: Not anticipated.  Plan for discharge home with mom able to provide supervision if needed.     Patient Condition: I have reviewed medical records from [facility], spoken with  [facility] , and patient and family member. I met with patient at the bedside and discussed via phone for inpatient rehabilitation assessment.  Patient will benefit from ongoing PT and OT, can actively participate in 3 hours of therapy a day 5 days of the week, and can make measurable gains during the admission.  Patient will also benefit from the coordinated team approach during an Inpatient Acute Rehabilitation admission.  The patient will receive intensive therapy as well  as Rehabilitation physician, nursing, social worker, and care management interventions.  Due to safety, skin/wound care, disease management, medication administration, pain management, and patient education the patient requires 24 hour a day rehabilitation nursing.  The patient is currently CGA to min assist with mobility and basic ADLs.  Discharge setting and therapy post discharge at home with home health is anticipated.  Patient has agreed to participate in the Acute Inpatient Rehabilitation Program and will admit today.   Preadmission Screen Completed By:  Stephania Fragmin, PT, DPT 06/03/2023 11:50 AM ______________________________________________________________________   Discussed status with Dr. Shearon Stalls  on 06/03/23  at 12:01 PM  and received approval for admission today.   Admission Coordinator:  Stephania Fragmin, PT, DPT time 12:01 PM Dorna Bloom 06/03/23     Assessment/Plan: Diagnosis: Does the need for close, 24 hr/day Medical supervision in concert with the patient's rehab needs make it unreasonable for this patient to be served in a less intensive setting? Yes Co-Morbidities requiring supervision/potential complications: MSSA bacteremia on IV antibiotics, wounds/skin breakdown, neuropathic pain, ESRD, HFREF, A fib, chronic respiratory failure, and hypotension.  Due to safety, skin/wound care, disease management, medication administration, pain management, and patient education, does the patient require 24 hr/day rehab nursing? Yes Does the patient require coordinated care of a physician, rehab nurse, PT, OT, and SLP to address physical and functional deficits in the context of the above medical diagnosis(es)? Yes Addressing deficits in the following areas: balance, endurance, locomotion, strength, transferring, bathing, dressing, feeding, grooming, and toileting Can the patient actively participate in an intensive therapy program of at least 3 hrs of therapy 5 days a week? Yes The potential for  patient to make measurable gains while on inpatient rehab is good Anticipated functional outcomes upon  discharge from inpatient rehab: supervision PT, supervision OT Estimated rehab length of stay to reach the above functional goals is: 16-18 days Anticipated discharge destination: Home 10. Overall Rehab/Functional Prognosis: good     MD Signature:   Angelina Sheriff, DO 06/03/2023           Revision History

## 2023-06-03 NOTE — Procedures (Signed)
HD Note:  Some information was entered later than the data was gathered due to patient care needs. The stated time with the data is accurate.  Received patient in bed to unit.   Alert and oriented.   Informed consent signed and in chart.   Access used: Right upper chest HD catheter Access issues: None  Patient tolerated treatment well.   TX duration: 3.5 hours  Alert, without acute distress.  Total UF removed: 2000 ml  Hand-off given to patient's nurse.   Transported back to the room   Mari Battaglia L. Dareen Piano, RN Kid[facility] Dialysis Unit.

## 2023-06-03 NOTE — Progress Notes (Signed)
Inpatient Rehab Admissions Coordinator:   Discussed with pt in HD with mom on phone.  Medicaid application is pending and still working to figure out Medicare ID number.  Pt will likely qualify for Medicaid given already on SSDI and ESRD on HD.  They are okay moving forward with admit to CIR with medicaid pending and they will continue to work on [employer] information.  I have a bed for patient to admit today and team in agreement.  I will make arrangements.   Estill Dooms, PT, DPT Admissions Coordinator [phone] 06/03/23  11:41 AM

## 2023-06-03 NOTE — Procedures (Signed)
Patient seen on Hemodialysis. BP 114/74   Pulse 78   Temp 98.2 F (36.8 C) (Oral)   Resp (!) 24   Ht 6' (1.829 m)   Wt (!) 157.4 kg   SpO2 100%   BMI 47.06 kg/m   QB 400, UF goal 2L Tolerating treatment without complaints at this time, frustrated with slow progress/drop foot and events of this hospitalization.   Zetta Bills MD [facility]. Office # [phone] Pager # [phone] 9:48 AM

## 2023-06-03 NOTE — H&P (Addendum)
Physical Medicine and Rehabilitation Admission H&P     CC: Functional deficits secondary to debility due to MSSA bacteremia, c/b multiple medical comorbidities   HPI: Phillip Young is a 37 year old male presented to the emergency department 05/18/2023 complaining of leg swelling and pain.  He has a history of end-stage renal disease on chronic intermittent hemodialysis on Mondays, Wednesdays and Fridays.  He stated he had missed hemodialysis treatment for 1 week because of transportation issues and unable to get out of his house.  He has a history of chronic hypoxic respiratory failure chronic oxygen therapy of 2-5 L.  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 Shirlee Latch.  He is maintained on Eliquis.  He was admitted to [facility] health internal medicine residency service.  Nephrology consulted for hemodialysis management.  Recently hospitalized over the summer with a prolonged hospital stay and discharged to [facility].  He was then discharged to [facility]hab on 10/02 and discharged from there on 10/18.  Developed fever to 103 on 10/29 accompanied by hypotension, malaise.  EKG consistent with atrial fibrillation with RVR.  Sepsis workup initiated and started on broad-spectrum antibiotics.  Cardiology consulted.  Transthoracic echocardiogram performed in July 2024 had an estimated EF of 20 to 25%.  Given gentle hydration and serial lactic acid determination.  Midodrine started 30 mg 3 times daily for blood pressure support.  Cardiology unable to utilize goal-directed medical therapy secondary to hypotension as well as his end-stage renal disease.  He is not a candidate for advanced therapies or ICD.  Tunneled dialysis catheter was removed 10/31 for line holiday.  Blood cultures positive for MSSA.  Echocardiogram updated 10/31 and left ventricular ejection fraction  estimated at 30% with global hypokinesis.  ID consultation obtained on 11/01 and plan 6 weeks IV antibiotics. Patient is not a candidate for TEE per primary's discussion with Dr. Anne Fu given morbid obesity, 4 to 5 L oxygen requirement, on 30 mg 3 times daily midodrine, SBP. Pressure injury of left buttock addressed. TDC replaced 11/04.  Follow-up by cardiology 11/06.  The patient remains in atrial fibrillation with reasonably controlled rate on digoxin.  He is not a candidate for amiodarone or AV nodal ablation with PPM.  He has failed attempts at DCCV and will continue Eliquis.  Dr. Duke Salvia discussed with the patient that his heart rate elevation with exertion is due to deconditioning.  Continue digoxin.  Recommend following up within 1 month of discharge and continue to check digoxin levels at least every 3 months. Tolerating, but does not like renal diet.            Review of Systems   Constitutional:  Positive for fever. Negative for chills.  HENT:  Negative for hearing loss.        Chronic hoarseness since intubation  Respiratory:  Negative for cough.        SOB with exertion  Cardiovascular:  Negative for chest pain and leg swelling.  Gastrointestinal:  Negative for abdominal pain and vomiting.  Musculoskeletal:  Positive for back pain.       BLE LE neuropathic pain he describes from knees down  Neurological:  Negative for dizziness and headaches.  Psychiatric/Behavioral:  Negative for depression. The patient is not nervous/anxious.           Past Medical History:  Diagnosis Date   Acute on chronic respiratory failure with  hypoxia ([name]) 04/21/2021   Acute on chronic systolic (congestive) heart failure ([name]) 02/26/2020   Amiodarone toxicity     Anemia     Atrial flutter ([name])     Biventricular congestive heart failure ([name])     Chronic hypoxemic respiratory failure ([name])     Class 3 severe obesity due to excess calories with serious comorbidity and body mass index (BMI) of 50.0  to 59.9 in adult [facility]) 02/26/2020   Essential hypertension 02/26/2020   GERD without esophagitis 02/26/2020   Hidradenitis suppurativa 02/26/2020   NICM (nonischemic cardiomyopathy) ([name])     Obesity hypoventilation syndrome ([name])     OSA (obstructive sleep apnea)     PAF (paroxysmal atrial fibrillation) ([name])     Pneumonia     Prediabetes 02/26/2020             Past Surgical History:  Procedure Laterality Date   ABSCESS DRAINAGE       AV FISTULA PLACEMENT Left 08/21/2021    Procedure: LEFT ARM ARTERIOVENOUS (AV) FISTULA.;  Surgeon: Nada Libman, MD;  Location: [facility] OR;  Service: Vascular;  Laterality: Left;   CARDIAC CATHETERIZATION       CARDIOVERSION N/A 10/09/2021    Procedure: CARDIOVERSION;  Surgeon: Laurey Morale, MD;  Location: [facility] ENDOSCOPY;  Service: Cardiovascular;  Laterality: N/A;   CARDIOVERSION N/A 05/28/2022    Procedure: CARDIOVERSION;  Surgeon: Laurey Morale, MD;  Location: [facility] ENDOSCOPY;  Service: Cardiovascular;  Laterality: N/A;   CARDIOVERSION N/A 06/07/2022    Procedure: CARDIOVERSION;  Surgeon: Laurey Morale, MD;  Location: [facility] ENDOSCOPY;  Service: Cardiovascular;  Laterality: N/A;   IR FLUORO GUIDE CV LINE RIGHT   03/10/2021   IR FLUORO GUIDE CV LINE RIGHT   04/22/2021   IR FLUORO GUIDE CV LINE RIGHT   08/20/2021   IR FLUORO GUIDE CV LINE RIGHT   03/01/2023   IR FLUORO GUIDE CV LINE RIGHT   03/09/2023   IR FLUORO GUIDE CV LINE RIGHT   05/30/2023   IR PTA VENOUS EXCEPT DIALYSIS CIRCUIT   03/09/2023   IR REMOVAL TUN CV CATH W/O FL   05/26/2023   IR REMOVE CV FIBRIN SHEATH   03/09/2023   IR US GUIDE VASC ACCESS RIGHT   03/10/2021   IR US GUIDE VASC ACCESS RIGHT   04/22/2021   IR US GUIDE VASC ACCESS RIGHT   05/30/2023   RIGHT HEART CATH N/A 03/06/2021    Procedure: RIGHT HEART CATH;  Surgeon: Dolores Patty, MD;  Location: [facility] INVASIVE CV LAB;  Service: Cardiovascular;  Laterality: N/A;   RIGHT/LEFT HEART CATH AND CORONARY ANGIOGRAPHY N/A 03/04/2020     Procedure: RIGHT/LEFT HEART CATH AND CORONARY ANGIOGRAPHY;  Surgeon: Laurey Morale, MD;  Location: [facility] INVASIVE CV LAB;  Service: Cardiovascular;  Laterality: N/A;   TEE WITHOUT CARDIOVERSION N/A 05/05/2021    Procedure: TRANSESOPHAGEAL ECHOCARDIOGRAM (TEE);  Surgeon: Laurey Morale, MD;  Location: [facility] ENDOSCOPY;  Service: Cardiovascular;  Laterality: N/A;   TEMPORARY DIALYSIS CATHETER   03/06/2021    Procedure: TEMPORARY DIALYSIS CATHETER;  Surgeon: Dolores Patty, MD;  Location: [facility] INVASIVE CV LAB;  Service: Cardiovascular;;             Family History  Problem Relation Age of Onset   Heart disease Mother     Hypertension Mother     Pulmonary Hypertension Mother     Drug abuse Father          died due to  Heroin overdose        Social History:  reports that he quit smoking about 5 years ago. His smoking use included cigarettes. He has been exposed to tobacco smoke. He has quit using smokeless tobacco. He reports that he does not drink alcohol and does not use drugs. Allergies:  Allergies       Allergies  Allergen Reactions   Amiodarone Other (See Comments)      Suspicion for amiodarone lung/hepatotoxicity     Coreg [Carvedilol] Shortness Of Breath and Diarrhea      Wheezing    Heparin Other (See Comments)      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, SRA positive   Metoprolol Other (See Comments)      near syncope   Amoxicillin Other (See Comments)      Was hospitalized    Other Swelling and Other (See Comments)      Steroids Fluid seeping out of legs             Medications Prior to Admission  Medication Sig Dispense Refill   apixaban (ELIQUIS) 2.5 MG TABS tablet Take 2.5 mg by mouth 2 (two) times daily.       Darbepoetin Alfa (ARANESP) 200 MCG/0.4ML SOSY injection Inject 0.4 mLs (200 mcg total) into the skin every Saturday at 6 PM.       digoxin (LANOXIN) 0.125 MG tablet Take 1 tablet (0.125 mg total) by mouth 3 (three) times a week.       gabapentin (NEURONTIN)  300 MG capsule Take 300 mg by mouth daily. Mon., Wed, and Fri.       lanthanum (FOSRENOL) 1000 MG chewable tablet Chew 2,000 mg by mouth 3 (three) times daily with meals.       melatonin 5 MG TABS Take 1 tablet (5 mg total) by mouth at bedtime. 30 tablet 0   midodrine (PROAMATINE) 10 MG tablet Take 3 tablets (30 mg total) by mouth 3 (three) times daily with meals.       Mouthwashes (MOUTH RINSE) LIQD solution 15 mLs by Mouth Rinse route in the morning, at noon, in the evening, and at bedtime.       [DISCONTINUED] oxyCODONE (ROXICODONE) 15 MG immediate release tablet Take 1 tablet (15 mg total) by mouth every 4 (four) hours as needed for severe pain.       albumin human 25 % bottle Inject 100 mLs (25 g total) into the vein Every Tuesday,Thursday,and Saturday with dialysis. (Patient not taking: Reported on 05/18/2023)       anticoagulant sodium citrate 4 GM/100ML SOLN 5 mLs by Intracatheter route once for 1 dose.       haloperidol lactate (HALDOL) 5 MG/ML injection Inject 0.2 mLs (1 mg total) into the vein every 12 (twelve) hours as needed. (Patient not taking: Reported on 05/18/2023)       hydrocortisone cream 1 % Apply 1 Application topically 3 (three) times daily as needed for itching (minor skin irritation). (Patient not taking: Reported on 05/18/2023)                  Home: Home Living Family/patient expects to be discharged to:: Private residence Living Arrangements: Parent Available Help at Discharge: Family Type of Home: House Home Access: Stairs to enter, Level entry (stairs at front, level entry at back) Home Layout: Able to live on main level with bedroom/bathroom, Two level Home Equipment: Wheelchair - manual, Hospital bed, Rolling Walker (2 wheels), Other (comment) (hoyer lift and sling) Additional Comments: Recieving  HH nursing care following return home from SNF rehab.   Functional History: Prior Function Mobility Comments: Prior to admission in June 2024 pt was independent  with mobility, since d/c from [facility] & return home pt was able to complete STS with supervision (easier time from elevated surfaces), step pivot bed>w/c but when he started missing dialysis pt was limited to bed mobility, using bed pan. ADLs Comments: sponge baths 2/2 dialysis port, started using bed pan at home when he started missing dialysis & BLE began to swell   Functional Status:  Mobility: Bed Mobility Overal bed mobility: Needs Assistance Bed Mobility: Supine to Sit Rolling: Supervision Supine to sit: Supervision Sit to supine: Contact guard assist General bed mobility comments: No assist needed to get to EOB Transfers Overall transfer level: Needs assistance Equipment used: Rolling walker (2 wheels) Transfers: Sit to/from Stand Sit to Stand: Contact guard assist, From elevated surface Bed to/from chair/wheelchair/BSC transfer type:: Step pivot Step pivot transfers: +2 safety/equipment, From elevated surface, Contact guard assist General transfer comment: CGA from EOB at elevated height x 5 reps Ambulation/Gait Ambulation/Gait assistance: +2 safety/equipment, Contact guard assist Gait Distance (Feet): 8 Feet (8 feet x 2) Assistive device: Rolling walker (2 wheels) Gait Pattern/deviations: Step-to pattern, Decreased stride length, Trunk flexed, Wide base of support General Gait Details: 4' fwd then back with RW and A for safety on 4L O2 with HR up to 152; wearing cam boot as the AFO that ortho techs brought will not fit into pts shoe or croc.  no assist needed.  Limited by HR and anxiety.  Side stepped to [facility] prior to sitting back down. Gait velocity interpretation: <1.31 ft/sec, indicative of household ambulator Pre-gait activities: ~2-3 ft forward/backward steps x2 reps, pt defer longer distance due to c/o pain and pt notably tachy while standing (HR ~141 bpm) due to pain.   ADL: ADL Overall ADL's : Needs assistance/impaired Eating/Feeding: Independent, Sitting, Bed  level Grooming: Oral care, Set up, Sitting Grooming Details (indicate cue type and reason): EOB Upper Body Bathing: Sitting, Set up Lower Body Bathing: Maximal assistance, Sitting/lateral leans Lower Body Bathing Details (indicate cue type and reason): Max A to apply lotion to BLEs, he can assist by applying it to his shins but not his feet. Upper Body Dressing : Sitting, Minimal assistance Lower Body Dressing: Total assistance, Bed level Lower Body Dressing Details (indicate cue type and reason): to don RLE boot and L shoe, predominantly secondary to pain Toilet Transfer: Minimal assistance, Ambulation, Rolling walker (2 wheels), BSC/3in1 Toilet Transfer Details (indicate cue type and reason): ~10 ft Toileting - Clothing Manipulation Details (indicate cue type and reason): declined OOB Functional mobility during ADLs: Minimal assistance, Rolling walker (2 wheels) General ADL Comments: intermittent min A for standing.   Cognition: Cognition Overall Cognitive Status: Within Functional Limits for tasks assessed Orientation Level: Oriented X4 Cognition Arousal: Alert Behavior During Therapy: WFL for tasks assessed/performed Overall Cognitive Status: Within Functional Limits for tasks assessed General Comments: needed encouragement to participate in session, limited by anxiety upon standing   Physical Exam: Blood pressure (!) 102/57, pulse 97, temperature 98.2 F (36.8 C), temperature source Oral, resp. rate 20, height 6' (1.829 m), weight (!) 157.4 kg, SpO2 99%. Physical Exam Constitutional: No apparent distress. Appropriate appearance for age. +Obese HENT: No JVD. Neck Supple. Trachea midline. Atraumatic, normocephalic. +Vocal hoarseness Eyes: PERRLA. EOMI. Visual fields grossly intact. +Slight Nystagmus on R lateral gaze Cardiovascular: RRR, no murmurs/rub/gallops. No Edema.  Respiratory: CTAB. No rales, rhonchi,  or wheezing. On 5L [place].  Abdomen: + bowel sounds, normoactive. No  distention or tenderness.  GU: Not examined.   Skin: Multiple white scarred areas over abdomen, legs, and buttocks form prior wounds + Buttocks wound covered in mepilex, tunneling packed with wet gauze, no apparent drainage   MSK:      No apparent deformity.       Neurologic exam:  Cognition: AAO to person, place, time and event.  Language: Fluent, No substitutions or neoglisms. No dysarthria. Names 3/3 objects correctly.  Memory: Recalls 3/3 objects at 5 minutes. No apparent deficits  Insight: Good insight into current condition.  Mood: Tearful, labile. Utilizes mom on phone to express anger/frustration regarding medical cares.  Sensation: Absent to light touch from R lateral knee to lateral ankle and dorsal foot.   - Hyperalgesic to light touch throughout distal LEs Reflexes: 1+ in BL UE and LEs. Negative Hoffman's and babinski signs bilaterally. No clonus on ankle jerk.  CN: 2-12 grossly intact.  Coordination: No apparent tremors. No ataxia  Spasticity: MAS 0 in all extremities.  Strength:                RUE: 4/5 R shoulder, otherwise 5-/5 throughout                LUE:  5-/5 throughout                RLE:  4-/5 HF, 4-/5 KE, 4-/5 KF, 0/5 DF, 0/5 PF, or 0/5 EHL                LLE:  4-/5 throughout        Lab Results Last 48 Hours        Results for orders placed or performed during the hospital encounter of 05/18/23 (from the past 48 hour(s))  Renal function panel     Status: Abnormal    Collection Time: 06/02/23  4:45 AM  Result Value Ref Range    Sodium 135 135 - 145 mmol/L    Potassium 3.9 3.5 - 5.1 mmol/L    Chloride 98 98 - 111 mmol/L    CO2 26 22 - 32 mmol/L    Glucose, Bld 87 70 - 99 mg/dL      Comment: Glucose reference range applies only to samples taken after fasting for at least 8 hours.    BUN 18 6 - 20 mg/dL    Creatinine, Ser 1.61 (H) 0.61 - 1.24 mg/dL    Calcium 9.9 8.9 - 09.6 mg/dL    Phosphorus 4.7 (H) 2.5 - 4.6 mg/dL    Albumin 2.7 (L) 3.5 - 5.0 g/dL     GFR, Estimated 8 (L) >60 mL/min      Comment: (NOTE) Calculated using the CKD-EPI Creatinine Equation (2021)      Anion gap 11 5 - 15      Comment: Performed at [facility], [address], [place], [place] [address]  Renal function panel     Status: Abnormal    Collection Time: 06/03/23  4:19 AM  Result Value Ref Range    Sodium 135 135 - 145 mmol/L    Potassium 4.0 3.5 - 5.1 mmol/L    Chloride 98 98 - 111 mmol/L    CO2 23 22 - 32 mmol/L    Glucose, Bld 98 70 - 99 mg/dL      Comment: Glucose reference range applies only to samples taken after fasting for at least 8 hours.  BUN 26 (H) 6 - 20 mg/dL    Creatinine, Ser 9.56 (H) 0.61 - 1.24 mg/dL    Calcium 21.3 (H) 8.9 - 10.3 mg/dL    Phosphorus 4.8 (H) 2.5 - 4.6 mg/dL    Albumin 2.7 (L) 3.5 - 5.0 g/dL    GFR, Estimated 7 (L) >60 mL/min      Comment: (NOTE) Calculated using the CKD-EPI Creatinine Equation (2021)      Anion gap 14 5 - 15      Comment: Performed at [facility], [address] [place], [place] [address]  CBC     Status: Abnormal    Collection Time: 06/03/23  8:06 AM  Result Value Ref Range    WBC 6.1 4.0 - 10.5 K/uL    RBC 3.07 (L) 4.22 - 5.81 MIL/uL    Hemoglobin 9.4 (L) 13.0 - 17.0 g/dL    HCT 84.6 (L) 96.2 - 52.0 %    MCV 100.0 80.0 - 100.0 fL    MCH 30.6 26.0 - 34.0 pg    MCHC 30.6 30.0 - 36.0 g/dL    RDW 95.2 (H) 84.1 - 15.5 %    Platelets 349 150 - 400 K/uL    nRBC 0.0 0.0 - 0.2 %      Comment: Performed at [facility], [address] [place], [place] [address]      Imaging Results (Last 48 hours)  No results found.         Blood pressure (!) 102/57, pulse 97, temperature 98.2 F (36.8 C), temperature source Oral, resp. rate 20, height 6' (1.829 m), weight (!) 157.4 kg, SpO2 99%.   Medical Problem List and Plan: 1. Functional deficits secondary to debility from MSSA bacteremia, c/b multiple medical comorbidities             -patient may shower with buttocks wound and  lines covered             -ELOS/Goals: 16-18 days, supervision PT/OT   - Stable to admit to CIR   2.  Antithrombotics: -DVT/anticoagulation:  Pharmaceutical: Eliquis 5 mg BID             -antiplatelet therapy: none   3. Pain Management: Tylenol as needed             -PO Dilaudid initiated on acute side due to nausea with oxycodone--> On admission patient requests to switch back, changed back to oxycodone 15 mg Q4H PRN and Dced Dilaudid   - For neuropathic pain gabapentin 300 mg MWF              4. Mood/Behavior/Sleep: LCSW to evaluate and provide emotional support             -continue melatonin 5 mg q HS             -antipsychotic agents: n/a   - Severe anxiety/?PTSD regarding traumatic medical cares from recent ICU encounter; please explain every intervention and clarify questions prior to initiating   5. Neuropsych/cognition: This patient is capable of making decisions on his own behalf.   - Would benefit from neuropsych consult due to above   6. Skin/Wound Care: Routine skin care checks             -pressure injury left buttock/posterior thigh>>continue wound care   - Offered LAL mattress given HX pressure wounds; patient declines   7. Fluids/Electrolytes/Nutrition: Routine Is and Os and follow-up chemistries -Renal diet with 1200 cc fluid restriction             -  Continue Fosrenol 2000 mg 3 times daily with meals   8: Hypotension: monitor TID and prn             -continue midodrine 30 mg 3 times daily with meals   9: pAF: on Eliquis              -unable to tolerate amiodarone (requires midodrine for BP so not a candidate for nodal agents)   10: Hypothyroidism: Continue Synthroid 25 mcg daily   11: Chronic systolic heart failure: EF ~30-35%             -continue digoxin 0.125 mg  3 x weekly             -daily weight             -volume status>>HD 3 x weekly   12: Chronic hypoxic respiratory failure: on home O2 5 L via [name]   13: LE neuropathy/ R foot drop: CAM boot   -  Pattern of weakness and sensory loss consistent with sciatic neuropathy above the knee (sparing hamstrings); discussed this with patient on admission, likely compressive from ICU stay  - hopeful for improvement and likely will need EMG as OP   14: Obesity: BMI 46.52   15: Anemia: on ESP   16: History or HIT positivity>>no heparin   17: MSSA bacteremia: will continue Ancef 2 g with HD treatment 3x/week (through 12/09)  18. Vocal hoarseness s/p extubation. Consider ENT evaluation if no improvement.     Milinda Antis, PA-C 06/03/2023  I have examined the patient independently and edited the note for HPI, ROS, exam, assessment, and plan as appropriate. I am in agreement with the above recommendations.   Angelina Sheriff, DO 06/03/2023

## 2023-06-03 NOTE — Progress Notes (Signed)
Patient ID: Phillip Young, male   DOB: [birth year], 37 y.o.   MRN: [id number]   Pt arrived to 4W26 per bed. Pt oriented to rehab and policies reviewed.  Assessment completed, vitals obtained. No complications noted.

## 2023-06-03 NOTE — Discharge Summary (Addendum)
Name: Phillip Young MRN: [id number] DOB: [birth year] 37 y.o. PCP: [name], DO  Date of Admission: 05/18/2023  2:51 PM Date of Discharge:  06/03/2023 Attending Physician: Dr.  Sol Blazing  DISCHARGE DIAGNOSIS:  Primary Problem: MSSA bacteremia   Hospital Problems: Principal Problem:   MSSA bacteremia Active Problems:   ESRD (end stage renal disease) on dialysis ([name])   Sepsis ([name])   Congestive heart failure ([name])   Acute on chronic hypoxic respiratory failure ([name])   Atrial fibrillation ([name])   Pressure injury of skin   Neuropathy   HFrEF (heart failure with reduced ejection fraction) ([name])    DISCHARGE MEDICATIONS:   Allergies as of 06/03/2023       Reactions   Amiodarone Other (See Comments)   Suspicion for amiodarone lung/hepatotoxicity   Coreg [carvedilol] Shortness Of Breath, Diarrhea   Wheezing    Heparin Other (See Comments)   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, SRA positive   Metoprolol Other (See Comments)   near syncope   Amoxicillin Other (See Comments)   Was hospitalized    Other Swelling, Other (See Comments)   Steroids Fluid seeping out of legs         Medication List     STOP taking these medications    albumin human 25 % bottle   anticoagulant sodium citrate 4 GM/100ML Soln   haloperidol lactate 5 MG/ML injection Commonly known as: HALDOL   hydrocortisone cream 1 %   mouth rinse Liqd solution   oxyCODONE 15 MG immediate release tablet Commonly known as: ROXICODONE       TAKE these medications    apixaban 5 MG Tabs tablet Commonly known as: ELIQUIS Take 1 tablet (5 mg total) by mouth 2 (two) times daily. What changed:  medication strength how much to take   ceFAZolin 2-4 GM/100ML-% IVPB Commonly known as: ANCEF Inject 100 mLs (2 g total) into the vein every Monday, Wednesday, and Friday with hemodialysis.   Darbepoetin Alfa 150 MCG/0.3ML Sosy injection Commonly known as: ARANESP Inject 0.3 mLs (150 mcg total) into the  skin every Saturday at 6 PM. Start taking on: June 04, 2023 What changed:  medication strength how much to take   digoxin 0.125 MG tablet Commonly known as: LANOXIN Take 1 tablet (0.125 mg total) by mouth 3 (three) times a week.   gabapentin 300 MG capsule Commonly known as: NEURONTIN Take 300 mg by mouth daily. Mon., Wed, and Fri.   Gerhardt's butt cream Crea Apply 1 Application topically 3 (three) times daily.   HYDROmorphone 2 MG tablet Commonly known as: DILAUDID Take 0.5 tablets (1 mg total) by mouth every 4 (four) hours as needed for severe pain (pain score 7-10).   lanthanum 1000 MG chewable tablet Commonly known as: FOSRENOL Chew 2,000 mg by mouth 3 (three) times daily with meals.   leptospermum manuka honey Pste paste Apply 1 Application topically daily.   levothyroxine 25 MCG tablet Commonly known as: SYNTHROID Take 1 tablet (25 mcg total) by mouth daily at 6 (six) AM.   melatonin 5 MG Tabs Take 1 tablet (5 mg total) by mouth at bedtime.   midodrine 10 MG tablet Commonly known as: PROAMATINE Take 3 tablets (30 mg total) by mouth 3 (three) times daily with meals.   ondansetron 4 MG/2ML Soln injection Commonly known as: ZOFRAN Inject 2 mLs (4 mg total) into the vein every 6 (six) hours as needed for nausea or vomiting.       ASK your doctor about  these medications    oxyCODONE 15 MG immediate release tablet Commonly known as: ROXICODONE Take 1 tablet (15 mg total) by mouth every 4 (four) hours as needed for up to 3 days. Ask about: Should I take this medication?               Discharge Care Instructions  (From admission, onward)           Start     Ordered   06/03/23 0000  Discharge wound care:       Comments: 1. Apply Gerhardt's Butt Cream to buttocks, upper thighs, groin 3 times a day and prn soiling.  2. L hip healing Stage 2 cover with silicone foam. Lift foam daily to assess.  Change foam every days and as needed for soiling.   3.Clean underneath pannus with soap and water, dry and apply Order [employer] [place] # [phone] Measure and cut length of InterDry to fit in skin folds that have skin breakdown Tuck InterDry fabric into skin folds in a single layer, allow for 2 inches of overhang from skin edges to allow for wicking to occur May remove to bathe; dry area thoroughly and then tuck into affected areas again Do not apply any creams or ointments when using InterDry DO NOT THROW AWAY FOR 5 DAYS unless soiled with stool DO NOT [facility] product, this will inactivate the silver in the material  New sheet of Interdry should be applied after 5 days of use if patient continues to have skin breakdown   06/03/23 1302   05/24/23 0000  Discharge wound care:       Comments: 1. Apply Gerhardt's Butt Cream to buttocks, upper thighs, groin 3 times a day and prn soiling.  2. L hip healing Stage 2 cover with silicone foam. Lift foam daily to assess.  Change foam q3 days and prn soiling.  3. Clean underneath pannus with soap and water, dry and apply Order [employer] [place] # [phone] Measure and cut length of InterDry to fit in skin folds that have skin breakdown Tuck InterDry fabric into skin folds in a single layer, allow for 2 inches of overhang from skin edges to allow for wicking to occur May remove to bathe; dry area thoroughly and then tuck into affected areas again Do not apply any creams or ointments when using InterDry DO NOT THROW AWAY FOR 5 DAYS unless soiled with stool DO NOT [facility] product, this will inactivate the silver in the material  New sheet of Interdry should be applied after 5 days of use if patient continues to have skin breakdown   05/24/23 1603            DISPOSITION AND FOLLOW-UP:  Phillip.Phillip Young was discharged from [facility] in stable condition. At the hospital follow up visit please address:  Follow-up Recommendations: Labs:  BMP, CBC Referrals: Evaluate need for OP physical therapy. Medications: He was  started on dilaudid while leaving, stopping oxycodone per nausea. On Ancef at HD through 12/9 for bacteremia.  Pt hospitalized for MSSA bacteremia. At follow up, ensure patient is making it to dialysis. He has trouble making it from his apartment to his transportation. Assess mobility. Assess weight. Assess diet and nutrition. Assess neuropathy. Ensure continued treatment for bacteremia.   Follow-up Appointments:  Follow-up Information     Marrianne Mood, MD Follow up.   Specialty: Internal Medicine Contact information: [address] [place] [address] [phone]         [facility]              Follow up on 06/30/2023.   Why: 7:45 am Contact information: [address] [place] [address] [phone]                HOSPITAL COURSE:  Patient Summary: Phillip Phillip Young presented to [facility] on 05/18/23 with complaint of leg swelling after missing multiple sessions of dialysis. He misses dialysis due to debility - foot drop, neuropathy, deconditioning - that prevented him from accessing his arranged transportation to dialysis. He was admitted for concern of hypervolemia. This resolved with dialysis and patient was ready for discharge on 10/29 after failure to locate a SNF and patient's preference to return home. On evening of discharge, the patient fell acutely ill, febrile with rigors and tachycardia, and was found to have MSSA Sepsis. Pts HD line was replaced, TTE was negative, TEE was deferred due to comorbidities (hypotension). HD line was replaced on 11/4 and pt resumed dialysis while receiving Ancef x 6 week. Thereafter, efforts were made to sent him to CIR for inpatient rehabilitation given concerns of high return risk should he be discharged to home. He will be discharged to inpatient physical rehab.   Sepsis MSSA bacteremia Acute fever on 10/29. Initial Vanc/Cefepime transitioned to Ancef.  Cardiology consulted to Afib RVR, rec no changes, continue digoxin. Clinical improvement thereafter, no fevers. Repeat blood cultures negative for growth. HD catheter removed 11/1 and replaced 11/4.  TTE without obvious vegetations, TEE would be best for ruling out endocarditis but risks outweigh benefits for this person. Discharge plan for 6 weeks of IV antibiotic therapy from negative cultures on 05/26/2023 ending 07/06/2023.   Pressure injury of skin Present on L buttocks due to his earlier hospitalization. Wound care consulted. Appears to be healing well, not concerned for infection.   Debility R foot drop Ambulation very limited due to deconditioning, body habitus, neuropathy, foot swelling/pain. Evaluated by PT/OT with recs for SNF, patient refused. Would benefit from OP physical therapy following inpatient rehab. Pt given a boot.   ESRD Pt attended MWF dialysis in hospital without complication. Line holiday 11/1 - 11/4.    Chronic HFrEF No active exacerbation while here. LVEF 30 to 35%. Well compensated. Continued digoxin 0.125 mg p.o. 3 times weekly. GDMT limited by chronic hypotension.   Persistent atrial fibrillation Rate controlled. Brief RVR when he became septic, cardiology consulted and followed but no recommended changes. Was stable thereafter. Refractory to cardioversion in past. Amiodarone poor choice given young age and risk to lungs.  Ablation risks outweigh benefits.  Continued anticoagulation with apixaban 5 mg p.o. twice daily.   Chronic hypoxic respiratory failure Stable supplemental O2 requirement. 5L is baseline. Tolerates less when at rest.   Hypotension Chronic, was stable on midodrine 30 mg 3 times daily AC.    Neuropathy Unfortunately has pretty bad neuropathy and foot drop after recent, prolonged hospitalization.  Continued gabapentin, urea, and lidocaine lotions. Consider TCA, duloxetine outpatient.   DISCHARGE INSTRUCTIONS:   Discharge Instructions      Call MD for:  persistant dizziness or light-headedness   Complete by: As directed    Call MD for:  persistant nausea and vomiting   Complete by: As directed    Call MD for:  temperature >100.4   Complete by: As directed    Diet - low sodium heart healthy   Complete by: As directed    Diet - low sodium heart  healthy   Complete by: As directed    Discharge instructions   Complete by: As directed    Phillip Young, Phillip Young were hospitalized for a blood stream infection. For that, you will be on antibiotics for six weeks total, ending on 12/9. Seek medical care if you develop fever, chest pain, or shortness of breath.  You will discharge to rehab. Work hard to get your strength up. The best thing you can do is improve your mobility so that you will make it to dialysis and be able to exercise in the future.  We look forward to seeing you in the [facility] clinic.Your appointment is on 11/26 at 2:15. Please call if you need to reschedule.  As you leave, not that we will change your pain medicine from oxycodone to dilaudid. Do not take if it makes you nauseous. We can discuss this at your appointment. We have also started a medicine called levothyroxine for your thyroid and will further evaluate it in the clinic.   Discharge wound care:   Complete by: As directed    1. Apply Gerhardt's Butt Cream to buttocks, upper thighs, groin 3 times a day and prn soiling.  2. L hip healing Stage 2 cover with silicone foam. Lift foam daily to assess.  Change foam q3 days and prn soiling.  3. Clean underneath pannus with soap and water, dry and apply Order [employer] [place] # [phone] Measure and cut length of InterDry to fit in skin folds that have skin breakdown Tuck InterDry fabric into skin folds in a single layer, allow for 2 inches of overhang from skin edges to allow for wicking to occur May remove to bathe; dry area thoroughly and then tuck into affected areas again Do not apply any creams or ointments when using InterDry DO NOT  THROW AWAY FOR 5 DAYS unless soiled with stool DO NOT [facility] product, this will inactivate the silver in the material  New sheet of Interdry should be applied after 5 days of use if patient continues to have skin breakdown   Discharge wound care:   Complete by: As directed    1. Apply Gerhardt's Butt Cream to buttocks, upper thighs, groin 3 times a day and prn soiling.  2. L hip healing Stage 2 cover with silicone foam. Lift foam daily to assess.  Change foam every days and as needed for soiling.  3.Clean underneath pannus with soap and water, dry and apply Order [employer] [place] # [phone] Measure and cut length of InterDry to fit in skin folds that have skin breakdown Tuck InterDry fabric into skin folds in a single layer, allow for 2 inches of overhang from skin edges to allow for wicking to occur May remove to bathe; dry area thoroughly and then tuck into affected areas again Do not apply any creams or ointments when using InterDry DO NOT THROW AWAY FOR 5 DAYS unless soiled with stool DO NOT [facility] product, this will inactivate the silver in the material  New sheet of Interdry should be applied after 5 days of use if patient continues to have skin breakdown   Increase activity slowly   Complete by: As directed    Increase activity slowly   Complete by: As directed        SUBJECTIVE:  Pt feeling well this morning. No acute complaints. Would like to trial alternative pain medicine vs oxycodone as it makes him nauseous.  Discharge Vitals:   BP (!) 112/54 (BP Location: Right Wrist)   Pulse 97  Temp 98.6 F (37 C) (Oral)   Resp (!) 22   Ht 6' (1.829 m)   Wt (!) 155.6 kg   SpO2 99%   BMI 46.52 kg/m   OBJECTIVE:  Physical Exam Constitutional:      General: He is not in acute distress.    Appearance: Normal appearance. He is obese. He is not ill-appearing.  Cardiovascular:     Rate and Rhythm: Normal rate and regular rhythm.     Pulses: Normal pulses.  Pulmonary:     Effort: Pulmonary  effort is normal.     Breath sounds: Normal breath sounds.  Abdominal:     General: Abdomen is flat.     Palpations: Abdomen is soft.     Tenderness: There is no abdominal tenderness.  Musculoskeletal:        General: Tenderness present.     Right lower leg: No edema.     Left lower leg: No edema.     Comments: Stable LE tenderness due to neuropathy. No wounds.  Skin:    General: Skin is warm and dry.     Capillary Refill: Capillary refill takes less than 2 seconds.  Neurological:     General: No focal deficit present.     Mental Status: He is alert and oriented to person, place, and time.  Psychiatric:        Mood and Affect: Mood normal.        Behavior: Behavior normal.      Pertinent Labs, Studies, and Procedures:     Latest Ref Rng & Units 06/03/2023    8:06 AM 06/01/2023    7:20 AM 05/28/2023    3:42 AM  CBC  WBC 4.0 - 10.5 K/uL 6.1  6.3  5.6   Hemoglobin 13.0 - 17.0 g/dL 9.4  82.9  9.6   Hematocrit 39.0 - 52.0 % 30.7  32.6  30.7   Platelets 150 - 400 K/uL 349  305  204        Latest Ref Rng & Units 06/03/2023    4:19 AM 06/02/2023    4:45 AM 06/01/2023    5:16 AM  CMP  Glucose 70 - 99 mg/dL 98  87  85   BUN 6 - 20 mg/dL 26  18  46   Creatinine 0.61 - 1.24 mg/dL 5.62  1.30  86.57   Sodium 135 - 145 mmol/L 135  135  134   Potassium 3.5 - 5.1 mmol/L 4.0  3.9  4.3   Chloride 98 - 111 mmol/L 98  98  94   CO2 22 - 32 mmol/L 23  26  22    Calcium 8.9 - 10.3 mg/dL 84.6  9.9  96.2     DG Chest Portable 1 View  Result Date: 05/18/2023 CLINICAL DATA:  Hypoxia.  Shortness of breath. EXAM: PORTABLE CHEST 1 VIEW COMPARISON:  March 08, 2023. FINDINGS: Stable cardiomegaly with central pulmonary vascular congestion. Right internal jugular dialysis catheter is noted. Mild bilateral pulmonary edema may be present. Bony thorax is unremarkable. IMPRESSION: Stable cardiomegaly with central pulmonary vascular congestion and possible mild bilateral pulmonary edema. Electronically  Signed   By: Lupita Raider M.D.   On: 05/18/2023 18:53     Signed: [name], DO Internal Medicine Resident, PGY-1 [facility]  Pager: [phone] 1:15 PM, 06/03/2023

## 2023-06-04 DIAGNOSIS — N186 End stage renal disease: Secondary | ICD-10-CM

## 2023-06-04 DIAGNOSIS — J9611 Chronic respiratory failure with hypoxia: Secondary | ICD-10-CM

## 2023-06-04 DIAGNOSIS — I5042 Chronic combined systolic (congestive) and diastolic (congestive) heart failure: Secondary | ICD-10-CM

## 2023-06-04 DIAGNOSIS — Z992 Dependence on renal dialysis: Secondary | ICD-10-CM

## 2023-06-04 DIAGNOSIS — G5701 Lesion of sciatic nerve, right lower limb: Secondary | ICD-10-CM

## 2023-06-04 DIAGNOSIS — M21371 Foot drop, right foot: Secondary | ICD-10-CM

## 2023-06-04 NOTE — Progress Notes (Signed)
Discussed air mattress with patient and MD. Educated patient about how air mattress can help prevent skin breakdown. Patient declines air mattress at this time, verbalized understanding of need to turn himself to avoid breakdown.

## 2023-06-04 NOTE — Progress Notes (Signed)
Through the earlier evening demonstrating somnolence and decrease in energy. Rounding on patient is observed resting in bed. Having to wake him up for scheduled medication. During interaction does not mention any changes with vision or any issues with xanthopsia. Denied at that time any N/V, but reported being hungry. Given snacks and sandwich. Also, given orange juice but did not drink the orange juice this evening. Around 2220 endorsed being nauseous. While prepping medication for symptoms of N/V did have multiple episodes of emesis. Filling one bag 50% and second bag 25%. Clear fluid observed and undigested food. Attempting to cover HD Cath but did become soiled and IV consult order placed. Assisted to the edge of the bed, washed up, and ADLS performed. O2 tubing replaced and suction set up. Assisted back into bed. Endorsed pain with lower extremities mainly in the area of his feet. Pt expressed hesitation with taking pain medication at this time due to Pt having concern PRN pain medication is causing the N/V. Expressed willingness to take PRN muscle relaxer to relieve nerve pain in his feet.

## 2023-06-04 NOTE — Progress Notes (Signed)
Physical Therapy Session Note  Patient Details  Name: Phillip Young MRN: [id number] Date of Birth: [birth year]  Today's Date: 06/04/2023 PT Individual Time: 7829-5621 PT Individual Time Calculation (min): 44 min   Short Term Goals: Week 1:  PT Short Term Goal 1 (Week 1): Pt will perform bed mobility with overall supervision with no c/o dizziness and change in HR <20 bpm. PT Short Term Goal 2 (Week 1): Pt will perform sit<>stand with CGA and seat height at 90/90 hip/ knee angles. PT Short Term Goal 3 (Week 1): Pt will perform stand pivot transfers with CGA/ MinA using LRAD. PT Short Term Goal 4 (Week 1): pt will ambulate at least 20 ft using LRAD with overall CGA/ MinA.  Skilled Therapeutic Interventions/Progress Updates:  Pt supine in bed on entrance to room. Pt relates fatigue from earlier sessions as well as ambulating to the toilet with staff and unable to return ambulate. Requests to stay in bed. Further education provided to pt re: reasoning for being in rehab and need for participation. Pt reluctant but agreeable to supine therex. Guided pt in AROM to max ranges for hip, knee and ankle mobility in order to strengthen. Educated to continue therex when in bed each day to max active range. Will need to continue to lie to each side as he is able in order to reduce pressure to buttocks and allow for continued healing.   Therapy Documentation Precautions:  Precautions Precautions: Fall Precaution Comments: R sided weakness, R foot drop, O2 with activity, watch HR (has a-fib), Stage 3 wound on buttock Required Braces or Orthoses: Other Brace Other Brace: R foot-up brace, or CAM boot Restrictions Weight Bearing Restrictions: No  Pain: No pain related this session.   Therapy/Group: Individual Therapy  Loel Dubonnet PT, DPT, CSRS 06/04/2023, 7:45 PM

## 2023-06-04 NOTE — Plan of Care (Signed)
  Problem: RH Balance Goal: LTG: Patient will maintain dynamic sitting balance (OT) Description: LTG:  Patient will maintain dynamic sitting balance with assistance during activities of daily living (OT) Flowsheets (Taken 06/04/2023 1549) LTG: Pt will maintain dynamic sitting balance during ADLs with: Independent with assistive device Goal: LTG Patient will maintain dynamic standing with ADLs (OT) Description: LTG:  Patient will maintain dynamic standing balance with assist during activities of daily living (OT)  Flowsheets (Taken 06/04/2023 1549) LTG: Pt will maintain dynamic standing balance during ADLs with: Supervision/Verbal cueing   Problem: Sit to Stand Goal: LTG:  Patient will perform sit to stand in prep for activites of daily living with assistance level (OT) Description: LTG:  Patient will perform sit to stand in prep for activites of daily living with assistance level (OT) Flowsheets (Taken 06/04/2023 1549) LTG: PT will perform sit to stand in prep for activites of daily living with assistance level: Supervision/Verbal cueing   Problem: RH Grooming Goal: LTG Patient will perform grooming w/assist,cues/equip (OT) Description: LTG: Patient will perform grooming with assist, with/without cues using equipment (OT) Flowsheets (Taken 06/04/2023 1549) LTG: Pt will perform grooming with assistance level of: Independent with assistive device    Problem: RH Bathing Goal: LTG Patient will bathe all body parts with assist levels (OT) Description: LTG: Patient will bathe all body parts with assist levels (OT) Flowsheets (Taken 06/04/2023 1549) LTG: Pt will perform bathing with assistance level/cueing: Supervision/Verbal cueing   Problem: RH Dressing Goal: LTG Patient will perform upper body dressing (OT) Description: LTG Patient will perform upper body dressing with assist, with/without cues (OT). Flowsheets (Taken 06/04/2023 1549) LTG: Pt will perform upper body dressing with assistance  level of: Independent with assistive device Goal: LTG Patient will perform lower body dressing w/assist (OT) Description: LTG: Patient will perform lower body dressing with assist, with/without cues in positioning using equipment (OT) Flowsheets (Taken 06/04/2023 1549) LTG: Pt will perform lower body dressing with assistance level of: Supervision/Verbal cueing   Problem: RH Toileting Goal: LTG Patient will perform toileting task (3/3 steps) with assistance level (OT) Description: LTG: Patient will perform toileting task (3/3 steps) with assistance level (OT)  Flowsheets (Taken 06/04/2023 1549) LTG: Pt will perform toileting task (3/3 steps) with assistance level: Supervision/Verbal cueing   Problem: RH Toilet Transfers Goal: LTG Patient will perform toilet transfers w/assist (OT) Description: LTG: Patient will perform toilet transfers with assist, with/without cues using equipment (OT) Flowsheets (Taken 06/04/2023 1549) LTG: Pt will perform toilet transfers with assistance level of: Contact Guard/Touching assist   Problem: RH Tub/Shower Transfers Goal: LTG Patient will perform tub/shower transfers w/assist (OT) Description: LTG: Patient will perform tub/shower transfers with assist, with/without cues using equipment (OT) Flowsheets (Taken 06/04/2023 1549) LTG: Pt will perform tub/shower stall transfers with assistance level of: Contact Guard/Touching assist

## 2023-06-04 NOTE — Evaluation (Signed)
Physical Therapy Assessment and Plan  Patient Details  Name: Phillip Young MRN: [id number] Date of Birth: 1986/03/24  PT Diagnosis: Difficulty walking, Dizziness and giddiness, Hypotonia, Impaired sensation, Muscle weakness, and Pain in RLE Rehab Potential: Good ELOS: 18-21 days   Today's Date: 06/04/2023 PT Individual Time: 4098-1191 PT Individual Time Calculation (min): 81 min    Hospital Problem: Principal Problem:   Debility   Past Medical History:  Past Medical History:  Diagnosis Date   Acute on chronic respiratory failure with hypoxia ([name]) 04/21/2021   Acute on chronic systolic (congestive) heart failure ([name]) 02/26/2020   Amiodarone toxicity    Anemia    Atrial flutter ([name])    Biventricular congestive heart failure ([name])    Chronic hypoxemic respiratory failure ([name])    Class 3 severe obesity due to excess calories with serious comorbidity and body mass index (BMI) of 50.0 to 59.9 in adult ([name]) 02/26/2020   Essential hypertension 02/26/2020   GERD without esophagitis 02/26/2020   Hidradenitis suppurativa 02/26/2020   NICM (nonischemic cardiomyopathy) ([name])    Obesity hypoventilation syndrome ([name])    OSA (obstructive sleep apnea)    PAF (paroxysmal atrial fibrillation) ([name])    Pneumonia    Prediabetes 02/26/2020   Past Surgical History:  Past Surgical History:  Procedure Laterality Date   ABSCESS DRAINAGE     AV FISTULA PLACEMENT Left 08/21/2021   Procedure: LEFT ARM ARTERIOVENOUS (AV) FISTULA.;  Surgeon: Nada Libman, MD;  Location: [facility] OR;  Service: Vascular;  Laterality: Left;   CARDIAC CATHETERIZATION     CARDIOVERSION N/A 10/09/2021   Procedure: CARDIOVERSION;  Surgeon: Laurey Morale, MD;  Location: [facility] ENDOSCOPY;  Service: Cardiovascular;  Laterality: N/A;   CARDIOVERSION N/A 05/28/2022   Procedure: CARDIOVERSION;  Surgeon: Laurey Morale, MD;  Location: [facility] ENDOSCOPY;  Service: Cardiovascular;  Laterality: N/A;   CARDIOVERSION N/A 06/07/2022    Procedure: CARDIOVERSION;  Surgeon: Laurey Morale, MD;  Location: [facility] ENDOSCOPY;  Service: Cardiovascular;  Laterality: N/A;   IR FLUORO GUIDE CV LINE RIGHT  03/10/2021   IR FLUORO GUIDE CV LINE RIGHT  04/22/2021   IR FLUORO GUIDE CV LINE RIGHT  08/20/2021   IR FLUORO GUIDE CV LINE RIGHT  03/01/2023   IR FLUORO GUIDE CV LINE RIGHT  03/09/2023   IR FLUORO GUIDE CV LINE RIGHT  05/30/2023   IR PTA VENOUS EXCEPT DIALYSIS CIRCUIT  03/09/2023   IR REMOVAL TUN CV CATH W/O FL  05/26/2023   IR REMOVE CV FIBRIN SHEATH  03/09/2023   IR US GUIDE VASC ACCESS RIGHT  03/10/2021   IR US GUIDE VASC ACCESS RIGHT  04/22/2021   IR US GUIDE VASC ACCESS RIGHT  05/30/2023   RIGHT HEART CATH N/A 03/06/2021   Procedure: RIGHT HEART CATH;  Surgeon: Dolores Patty, MD;  Location: [facility] INVASIVE CV LAB;  Service: Cardiovascular;  Laterality: N/A;   RIGHT/LEFT HEART CATH AND CORONARY ANGIOGRAPHY N/A 03/04/2020   Procedure: RIGHT/LEFT HEART CATH AND CORONARY ANGIOGRAPHY;  Surgeon: Laurey Morale, MD;  Location: [facility] INVASIVE CV LAB;  Service: Cardiovascular;  Laterality: N/A;   TEE WITHOUT CARDIOVERSION N/A 05/05/2021   Procedure: TRANSESOPHAGEAL ECHOCARDIOGRAM (TEE);  Surgeon: Laurey Morale, MD;  Location: [facility] ENDOSCOPY;  Service: Cardiovascular;  Laterality: N/A;   TEMPORARY DIALYSIS CATHETER  03/06/2021   Procedure: TEMPORARY DIALYSIS CATHETER;  Surgeon: Dolores Patty, MD;  Location: [facility] INVASIVE CV LAB;  Service: Cardiovascular;;    Assessment & Plan Clinical Impression: Patient is a 37  y.o. male presented to the emergency department 05/18/2023 complaining of leg swelling and pain.  He has a history of end-stage renal disease on chronic intermittent hemodialysis on Mondays, Wednesdays and Fridays.  He stated he had missed hemodialysis treatment for 1 week because of transportation issues and unable to get out of his house.  He has a history of chronic hypoxic respiratory failure chronic oxygen therapy of 2-5 L.   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 Shirlee Latch.  He is maintained on Eliquis.  He was admitted to [facility] health internal medicine residency service.  Nephrology consulted for hemodialysis management.  Recently hospitalized over the summer with a prolonged hospital stay and discharged to [facility].  He was then discharged to [facility] rehab on 10/02 and discharged from there on 10/18.  Developed fever to 103 on 10/29 accompanied by hypotension, malaise.  EKG consistent with atrial fibrillation with RVR.  Sepsis workup initiated and started on broad-spectrum antibiotics.  Cardiology consulted.  Transthoracic echocardiogram performed in July 2024 had an estimated EF of 20 to 25%.  Given gentle hydration and serial lactic acid determination.  Midodrine started 30 mg 3 times daily for blood pressure support.  Cardiology unable to utilize goal-directed medical therapy secondary to hypotension as well as his end-stage renal disease.  He is not a candidate for advanced therapies or ICD.  Tunneled dialysis catheter was removed 10/31 for line holiday.  Blood cultures positive for MSSA.  Echocardiogram updated 10/31 and left ventricular ejection fraction estimated at 30% with global hypokinesis.  ID consultation obtained on 11/01 and plan 6 weeks IV antibiotics. Patient is not a candidate for TEE per primary's discussion with Dr. Anne Fu given morbid obesity, 4 to 5 L oxygen requirement, on 30 mg 3 times daily midodrine, SBP. Pressure injury of left buttock addressed. TDC replaced 11/04.  Follow-up by cardiology 11/06.  The patient remains in atrial fibrillation with reasonably controlled rate on digoxin.  He is not a candidate for amiodarone or AV nodal ablation with PPM.  He has failed attempts at DCCV and will continue Eliquis.  Dr. Duke Salvia discussed with the patient that his  heart rate elevation with exertion is due to deconditioning.  Continue digoxin.  Recommend following up within 1 month of discharge and continue to check digoxin levels at least every 3 months. Tolerating, but does not like renal diet. Patient transferred to CIR on 06/03/2023 .   Patient currently requires mod assist with mobility secondary to muscle weakness, decreased cardiorespiratoy endurance and decreased oxygen support, impaired timing and sequencing and unbalanced muscle activation, and decreased standing balance, decreased postural control, and decreased balance strategies.  Prior to hospitalization, patient was independent  with mobility and lived with Family in a House home.  Home access is 5-6 steps at regular entranceStairs to enter, Level entry.  Patient will benefit from skilled PT intervention to maximize safe functional mobility, minimize fall risk, and decrease caregiver burden for planned discharge home with 24 hour supervision.  Anticipate patient will benefit from follow up HH at discharge.  PT - End of Session Activity Tolerance: Tolerates < 10 min activity with changes in vital signs Endurance Deficit: Yes Endurance Deficit Description: O2 dep, significant fatigue after brief activity PT Assessment Rehab Potential (ACUTE/IP ONLY): Good PT Barriers to Discharge: Inaccessible home environment;Decreased caregiver support;Home environment access/layout;Neurogenic Bowel & Bladder;Wound Care;Insurance for SNF coverage;Weight;Hemodialysis;Medication compliance;Nutrition means  PT Patient demonstrates impairments in the following area(s): Balance;Safety;Behavior;Edema;Sensory;Skin Integrity;Endurance;Motor;Nutrition;Pain PT Transfers Functional Problem(s): Bed Mobility;Bed to Chair;Car;Furniture PT Locomotion Functional Problem(s): Ambulation;Stairs PT Plan PT Intensity: Minimum of 1-2 x/day ,45 to 90 minutes PT Frequency: 5 out of 7 days PT Treatment/Interventions: Ambulation/gait  training;Community reintegration;DME/adaptive equipment instruction;Neuromuscular re-education;Psychosocial support;Stair training;UE/LE Strength taining/ROM;Wheelchair propulsion/positioning;UE/LE Coordination activities;Therapeutic Activities;Skin care/wound management;Pain management;Functional electrical stimulation;Discharge planning;Balance/vestibular training;Cognitive remediation/compensation;Disease management/prevention;Functional mobility training;Patient/family education;Splinting/orthotics;Therapeutic Exercise;Visual/perceptual remediation/compensation PT Transfers Anticipated Outcome(s): supervision PT Locomotion Anticipated Outcome(s): CGA PT Recommendation Recommendations for Other Services: Therapeutic Recreation consult Therapeutic Recreation Interventions: Stress management;Outing/community reintergration;Kitchen group Follow Up Recommendations: Home health PT;24 hour supervision/assistance Patient destination: Home Equipment Recommended: To be determined   PT Evaluation Precautions/Restrictions Precautions Precautions: Fall Precaution Comments: R sided weakness, R foot drop, O2 with activity, watch HR (has a-fib), Stage 3 wound on buttock Required Braces or Orthoses: Other Brace Other Brace: R foot-up brace, or CAM boot Restrictions Weight Bearing Restrictions: No General   Vital SignsTherapy Vitals Temp: 98 F (36.7 C) Resp: 18 BP: 112/68 Patient Position (if appropriate): Lying Oxygen Therapy SpO2: 100 % O2 Device: Nasal Cannula Pain Pain Assessment Pain Scale: 0-10 Pain Score: 0-No pain Pain Type: Acute pain;Neuropathic pain Pain Location: Foot Pain Orientation: Left;Right Pain Descriptors / Indicators: Tingling;Throbbing;Numbness Pain Frequency: Constant Pain Onset: On-going Patients Stated Pain Goal: 2 Pain Intervention(s): Medication (See eMAR) Multiple Pain Sites: No Pain Interference Pain Interference Pain Effect on Sleep: 2. Occasionally Pain  Interference with Therapy Activities: 3. Frequently Pain Interference with Day-to-Day Activities: 3. Frequently Home Living/Prior Functioning Home Living Available Help at Discharge: Family Type of Home: House Home Access: Stairs to enter;Level entry Entrance Stairs-Number of Steps: 5-6 steps at regular entrance Entrance Stairs-Rails: Right;Left;Can reach both Home Layout: Able to live on main level with bedroom/bathroom;One level Alternate Level Stairs-Number of Steps: 2 steps at back door Alternate Level Stairs-Rails: None Bathroom Shower/Tub: Engineer, manufacturing systems: Handicapped height Bathroom Accessibility: Yes Additional Comments: Recieving HH nursing care following return home from SNF rehab.,  Lives With: Family Prior Function Level of Independence: Independent with basic ADLs;Independent with homemaking with ambulation;Independent with gait;Independent with transfers  Able to Take Stairs?: Yes Driving: Yes Vocation: Full time employment Vocation Requirements: Works as a Midwife. Independent for ADLs/IADLs. Sponge bathing d/t dialysis port Leisure: Hobbies-yes (Comment) (music) Vision/Perception  Vision - History Ability to See in Adequate Light: 0 Adequate Perception Perception: Within Functional Limits Praxis Praxis: WFL  Cognition Overall Cognitive Status: Within Functional Limits for tasks assessed Arousal/Alertness: Awake/alert Orientation Level: Oriented X4 Memory: Appears intact Awareness: Appears intact Problem Solving: Appears intact Safety/Judgment: Appears intact Sensation Sensation Light Touch: Impaired by gross assessment Hot/Cold: Impaired by gross assessment Proprioception: Appears Intact Stereognosis: Appears Intact Coordination Gross Motor Movements are Fluid and Coordinated: No Fine Motor Movements are Fluid and Coordinated: Yes Coordination and Movement Description: RLE sensory deficits and generalized weakness/ debility impacts  motor control Finger Nose Finger Test: intact Bly 9 Hole Peg Test: 9HPT 25 sec L, 27 sec R Motor  Motor Motor: Other (comment) Motor - Skilled Clinical Observations: large body habitus, sensory and motor weakness, R foot drop, R RTC weakness   Trunk/Postural Assessment  Cervical Assessment Cervical Assessment: Within Functional Limits Thoracic Assessment Thoracic Assessment: Within Functional Limits Lumbar Assessment Lumbar Assessment: Within Functional Limits Postural Control Postural Control: Deficits on evaluation Righting Reactions: delayed due to prolonged debility and bed rest  Balance Balance Balance Assessed: Yes Static Sitting Balance Static Sitting - Balance Support: Feet supported;Bilateral upper extremity  supported Static Sitting - Level of Assistance: 5: Stand by assistance Dynamic Sitting Balance Dynamic Sitting - Balance Support: Feet supported;Bilateral upper extremity supported Dynamic Sitting - Level of Assistance: 5: Stand by assistance Static Standing Balance Static Standing - Balance Support: Bilateral upper extremity supported;During functional activity Static Standing - Level of Assistance: 4: Min assist Dynamic Standing Balance Dynamic Standing - Balance Support: Bilateral upper extremity supported;During functional activity Dynamic Standing - Level of Assistance: 3: Mod assist Extremity Assessment  RUE Assessment RUE Assessment: Exceptions to [facility] Active Range of Motion (AROM) Comments: 0-45 R sh General Strength Comments: 2-/5 R sh, Grip 115 R LUE Assessment LUE Assessment: Within Functional Limits General Strength Comments: Grip  110 L RLE Assessment RLE Assessment: Exceptions to [facility] RLE Strength RLE Overall Strength: Deficits Right Hip Flexion: 3+/5 Right Hip Extension: 3+/5 Right Hip ABduction: 4-/5 Right Hip ADduction: 4-/5 Right Knee Flexion: 4-/5 Right Knee Extension: 4+/5 Right Ankle Dorsiflexion: 2-/5 Right Ankle Plantar Flexion:  2-/5 LLE Assessment LLE Assessment: Within Functional Limits General Strength Comments: grossly 4- to 4/ 5 proximal to distal  Care Tool Care Tool Bed Mobility Roll left and right activity   Roll left and right assist level: Contact Guard/Touching assist    Sit to lying activity   Sit to lying assist level: Minimal Assistance - Patient > 75%    Lying to sitting on side of bed activity   Lying to sitting on side of bed assist level: the ability to move from lying on the back to sitting on the side of the bed with no back support.: Contact Guard/Touching assist     Care Tool Transfers Sit to stand transfer   Sit to stand assist level: Minimal Assistance - Patient > 75%    Chair/bed transfer   Chair/bed transfer assist level: Moderate Assistance - Patient 50 - 74%    Car transfer Car transfer activity did not occur: Safety/medical concerns        Care Tool Locomotion Ambulation   Assist level: Minimal Assistance - Patient > 75% Assistive device: Walker-rolling Max distance: 8 ft  Walk 10 feet activity Walk 10 feet activity did not occur: Safety/medical concerns       Walk 50 feet with 2 turns activity Walk 50 feet with 2 turns activity did not occur: Safety/medical concerns      Walk 150 feet activity Walk 150 feet activity did not occur: Safety/medical concerns      Walk 10 feet on uneven surfaces activity Walk 10 feet on uneven surfaces activity did not occur: Safety/medical concerns      Stairs Stair activity did not occur: Safety/medical concerns        Walk up/down 1 step activity Walk up/down 1 step or curb (drop down) activity did not occur: Safety/medical concerns      Walk up/down 4 steps activity Walk up/down 4 steps activity did not occur: Safety/medical concerns      Walk up/down 12 steps activity Walk up/down 12 steps activity did not occur: Safety/medical concerns      Pick up small objects from floor Pick up small object from the floor (from  standing position) activity did not occur: Safety/medical concerns      Wheelchair Is the patient using a wheelchair?: Yes Type of Wheelchair: Manual   Wheelchair assist level: Dependent - Patient 0% Max wheelchair distance: 400 ft  Wheel 50 feet with 2 turns activity   Assist Level: Dependent - Patient 0%  Wheel 150 feet activity  Assist Level: Dependent - Patient 0%    Refer to Care Plan for Long Term Goals  SHORT TERM GOAL WEEK 1 PT Short Term Goal 1 (Week 1): Pt will perform bed mobility with overall supervision with no c/o dizziness and change in HR <20 bpm. PT Short Term Goal 2 (Week 1): Pt will perform sit<>stand with CGA and seat height at 90/90 hip/ knee angles. PT Short Term Goal 3 (Week 1): Pt will perform stand pivot transfers with CGA/ MinA using LRAD. PT Short Term Goal 4 (Week 1): pt will ambulate at least 20 ft using LRAD with overall CGA/ MinA.  Recommendations for other services: Therapeutic Recreation  Stress management and Outing/community reintegration  Skilled Therapeutic Intervention Mobility Bed Mobility Bed Mobility: Rolling Right;Rolling Left;Left Sidelying to Sit;Supine to Sit;Sit to Supine Rolling Right: Contact Guard/Touching assist Rolling Left: Contact Guard/Touching assist Left Sidelying to Sit: Contact Guard/Touching assist Supine to Sit: Contact Guard/Touching assist Sit to Supine: Moderate Assistance - Patient 50-74% Transfers Transfers: Stand Pivot Transfers;Sit to Stand;Stand to Sit Sit to Stand: Minimal Assistance - Patient > 75% (elevated bed height) Stand to Sit: Minimal Assistance - Patient > 75% Stand Pivot Transfers: Moderate Assistance - Patient 50 - 74%;Minimal Assistance - Patient > 75% Transfer (Assistive device): Rolling walker Locomotion  Gait Ambulation: Yes Gait Assistance: Minimal Assistance - Patient > 75% Gait Distance (Feet): 8 Feet Assistive device: Rolling walker Gait Gait: Yes Gait Pattern: Step-through  pattern;Decreased step length - right;Decreased stance time - right;Decreased dorsiflexion - right;Poor foot clearance - right Gait velocity: decreased Stairs / Additional Locomotion Stairs: No Wheelchair Mobility Wheelchair Mobility: Yes Wheelchair Assistance: Dependent - Patient 0% Wheelchair Parts Management: Needs assistance Distance: 400 ft  Skilled Intervention: PT Evaluation completed; see above for results. PT educated patient in roles of PT vs OT, PT POC, rehab potential, rehab goals, and discharge recommendations along with recommendation for follow-up rehabilitation services. Individual treatment initiated:  Patient supine in bed upon PT arrival. Patient alert and agreeable to PT session. No pain complaint during session. RN present and changing dressings on pt's wounds on R buttock and L posterior thigh.   Pt on 4L O2 with SpO2 at 100%. Reduced to 2L during in-room mobility and maintains at lowest 94%.   Pt provides detailed recent medical history and is not happy about care received during recent medical stays. Relates personal beliefs re: care received. Provided empathetic listening to build rapport and therapeutic alliance.   Therapeutic Activity: Bed Mobility: Patient performed supine > sit with large momentum build using BLE. Relates dizziness on reaching EOB. Educated pt to take time to transition to different positions in order to decrease dizziness. Pt states that his R foot neuropathy cannot touch the cold floor as it has a cold sensitivity that causes a feeling of "ice fire". Folded blanket on floor to shield foot until shoes located.  Transfers: Patient performed sit <> stand from elevated bed surface with CGA and heavy lean into bari RW. Provided vc/tc for improved positioning and forward weight shift. Stand pivot with good step sequencing and floor clearance as well as able to reach back to use RUE to help control descent into w/c seat. HR difficult to obtain using  dynamap d/t pt's a-fib. Radial pulse at 80 bpm.   O2 tank obtained in order to leave room. Pt taken around unit in w/c for continued build of rapport and therapeutic buy-in.   Gait Training:  Patient ambulated 8' x2 using RW with MinA. Ambulated  with intermittent slide of R foot. Relates fatigue following bouts and requires significant rest breaks to recover. Provided vc/tc for maintaining effort and RLE knee extension during stance phase.   Patient seated upright in EOB at end of session with brakes locked, bed alarm set, and all needs within reach. NT informed as to pt's disposition.    Discharge Criteria: Patient will be discharged from PT if patient refuses treatment 3 consecutive times without medical reason, if treatment goals not met, if there is a change in medical status, if patient makes no progress towards goals or if patient is discharged from hospital.  The above assessment, treatment plan, treatment alternatives and goals were discussed and mutually agreed upon: by patient  Loel Dubonnet 06/04/2023, 7:01 PM

## 2023-06-04 NOTE — Progress Notes (Signed)
Orthopedic Tech Progress Note Patient Details:  Phillip Young [birth year] [id number]  Ortho Devices Type of Ortho Device: Prafo boot/shoe Ortho Device/Splint Location: handed to PT who was at bedside working with the pt Ortho Device/Splint Interventions: Ordered   Post Interventions Instructions Provided: Care of device, Adjustment of device  Docia Furl 06/04/2023, 3:09 PM

## 2023-06-04 NOTE — Evaluation (Signed)
Occupational Therapy Assessment and Plan  Patient Details  Name: Phillip Young MRN: [id number] Date of Birth: [birth year]  OT Diagnosis: acute pain, muscular wasting and disuse atrophy, muscle weakness (generalized), and swelling of limb Rehab Potential: Rehab Potential (ACUTE ONLY): Good ELOS: 2.5-3 weeks   Today's Date: 06/04/2023 OT Individual Time: 1300-1414 OT Individual Time Calculation (min): 74 min     Hospital Problem: Principal Problem:   Debility   Past Medical History:  Past Medical History:  Diagnosis Date   Acute on chronic respiratory failure with hypoxia ([name]) 04/21/2021   Acute on chronic systolic (congestive) heart failure ([name]) 02/26/2020   Amiodarone toxicity    Anemia    Atrial flutter ([name])    Biventricular congestive heart failure ([name])    Chronic hypoxemic respiratory failure ([name])    Class 3 severe obesity due to excess calories with serious comorbidity and body mass index (BMI) of 50.0 to 59.9 in adult ([name]) 02/26/2020   Essential hypertension 02/26/2020   GERD without esophagitis 02/26/2020   Hidradenitis suppurativa 02/26/2020   NICM (nonischemic cardiomyopathy) ([name])    Obesity hypoventilation syndrome ([name])    OSA (obstructive sleep apnea)    PAF (paroxysmal atrial fibrillation) ([name])    Pneumonia    Prediabetes 02/26/2020   Past Surgical History:  Past Surgical History:  Procedure Laterality Date   ABSCESS DRAINAGE     AV FISTULA PLACEMENT Left 08/21/2021   Procedure: LEFT ARM ARTERIOVENOUS (AV) FISTULA.;  Surgeon: Nada Libman, MD;  Location: [facility] OR;  Service: Vascular;  Laterality: Left;   CARDIAC CATHETERIZATION     CARDIOVERSION N/A 10/09/2021   Procedure: CARDIOVERSION;  Surgeon: Laurey Morale, MD;  Location: [facility] ENDOSCOPY;  Service: Cardiovascular;  Laterality: N/A;   CARDIOVERSION N/A 05/28/2022   Procedure: CARDIOVERSION;  Surgeon: Laurey Morale, MD;  Location: [facility] ENDOSCOPY;  Service: Cardiovascular;  Laterality: N/A;    CARDIOVERSION N/A 06/07/2022   Procedure: CARDIOVERSION;  Surgeon: Laurey Morale, MD;  Location: [facility] ENDOSCOPY;  Service: Cardiovascular;  Laterality: N/A;   IR FLUORO GUIDE CV LINE RIGHT  03/10/2021   IR FLUORO GUIDE CV LINE RIGHT  04/22/2021   IR FLUORO GUIDE CV LINE RIGHT  08/20/2021   IR FLUORO GUIDE CV LINE RIGHT  03/01/2023   IR FLUORO GUIDE CV LINE RIGHT  03/09/2023   IR FLUORO GUIDE CV LINE RIGHT  05/30/2023   IR PTA VENOUS EXCEPT DIALYSIS CIRCUIT  03/09/2023   IR REMOVAL TUN CV CATH W/O FL  05/26/2023   IR REMOVE CV FIBRIN SHEATH  03/09/2023   IR US GUIDE VASC ACCESS RIGHT  03/10/2021   IR US GUIDE VASC ACCESS RIGHT  04/22/2021   IR US GUIDE VASC ACCESS RIGHT  05/30/2023   RIGHT HEART CATH N/A 03/06/2021   Procedure: RIGHT HEART CATH;  Surgeon: Dolores Patty, MD;  Location: [facility] INVASIVE CV LAB;  Service: Cardiovascular;  Laterality: N/A;   RIGHT/LEFT HEART CATH AND CORONARY ANGIOGRAPHY N/A 03/04/2020   Procedure: RIGHT/LEFT HEART CATH AND CORONARY ANGIOGRAPHY;  Surgeon: Laurey Morale, MD;  Location[facility] INVASIVE CV LAB;  Service: Cardiovascular;  Laterality: N/A;   TEE WITHOUT CARDIOVERSION N/A 05/05/2021   Procedure: TRANSESOPHAGEAL ECHOCARDIOGRAM (TEE);  Surgeon: Laurey Morale, MD;  Location: [facility] ENDOSCOPY;  Service: Cardiovascular;  Laterality: N/A;   TEMPORARY DIALYSIS CATHETER  03/06/2021   Procedure: TEMPORARY DIALYSIS CATHETER;  Surgeon: Dolores Patty, MD;  Location: [facility] INVASIVE CV LAB;  Service: Cardiovascular;;    Assessment & Plan Clinical  Impression:  Phillip Young is a 37 year old male presented to the emergency department 05/18/2023 complaining of leg swelling and pain.  He has a history of end-stage renal disease on chronic intermittent hemodialysis on Mondays, Wednesdays and Fridays.  He stated he had missed hemodialysis treatment for 1 week because of transportation issues and unable to get out of his house.  He has a history of chronic hypoxic respiratory  failure chronic oxygen therapy of 2-5 L.  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 Shirlee Latch.  He is maintained on Eliquis.  He was admitted to [facility] health internal medicine residency service.  Nephrology consulted for hemodialysis management.  Recently hospitalized over the summer with a prolonged hospital stay and discharged to [facility].  He was then discharged to [facility] on 10/02 and discharged from there on 10/18.  Developed fever to 103 on 10/29 accompanied by hypotension, malaise.  EKG consistent with atrial fibrillation with RVR.  Sepsis workup initiated and started on broad-spectrum antibiotics.  Cardiology consulted.  Transthoracic echocardiogram performed in July 2024 had an estimated EF of 20 to 25%.  Given gentle hydration and serial lactic acid determination.  Midodrine started 30 mg 3 times daily for blood pressure support.  Cardiology unable to utilize goal-directed medical therapy secondary to hypotension as well as his end-stage renal disease.  He is not a candidate for advanced therapies or ICD.  Tunneled dialysis catheter was removed 10/31 for line holiday.  Blood cultures positive for MSSA.  Echocardiogram updated 10/31 and left ventricular ejection fraction estimated at 30% with global hypokinesis.  ID consultation obtained on 11/01 and plan 6 weeks IV antibiotics. Patient is not a candidate for TEE per primary's discussion with Dr. Anne Fu given morbid obesity, 4 to 5 L oxygen requirement, on 30 mg 3 times daily midodrine, SBP. Pressure injury of left buttock addressed. TDC replaced 11/04.  Follow-up by cardiology 11/06.  The patient remains in atrial fibrillation with reasonably controlled rate on digoxin.  He is not a candidate for amiodarone or AV nodal ablation with PPM.  He has failed attempts at DCCV and will continue Eliquis.  Dr.  Duke Salvia discussed with the patient that his heart rate elevation with exertion is due to deconditioning.  Continue digoxin.  Recommend following up within 1 month of discharge and continue to check digoxin levels at least every 3 months. Tolerating, but does not like renal diet.   Patient currently requires max with basic self-care skills and IADL secondary to muscle weakness, muscle joint tightness, and muscle paralysis, decreased cardiorespiratoy endurance and decreased oxygen support, unbalanced muscle activation, and decreased standing balance, decreased postural control, and decreased balance strategies.  Prior to June 2024 pt was totally indep hospitalization,after [facility]  patient could complete BADL's with mod A and was using hoyer lift thru the summer 2024 until rehospitalization.   Patient will benefit from skilled intervention to decrease level of assist with basic self-care skills, increase independence with basic self-care skills, and increase level of independence with iADL prior to discharge home with care partner.  Anticipate patient will require 24 hour supervision and minimal physical assistance and follow up home health.  OT - End of Session Activity Tolerance: Decreased this session Endurance Deficit: Yes Endurance Deficit Description: O2 dep, significant fatigue after brief activity OT Assessment Rehab Potential (ACUTE ONLY): Good OT Barriers to Discharge: Inaccessible home environment;Decreased  caregiver support;Home environment access/layout;Wound Care;Weight;Hemodialysis;New oxygen OT Barriers to Discharge Comments: O2, increased body habitus, wounds, HD, ste, family works OT Patient demonstrates impairments in the following area(s): Balance;Edema;Endurance;Pain;Sensory;Motor;Skin Integrity OT Basic ADL's Functional Problem(s): Grooming;Bathing;Dressing;Toileting OT Advanced ADL's Functional Problem(s): Simple Meal Preparation;Light Housekeeping;Laundry OT Transfers  Functional Problem(s): Toilet;Tub/Shower OT Additional Impairment(s): Fuctional Use of Upper Extremity OT Plan OT Intensity: Minimum of 1-2 x/day, 45 to 90 minutes OT Frequency: 5 out of 7 days OT Duration/Estimated Length of Stay: 2.5-3 weeks OT Treatment/Interventions: Balance/vestibular training;Cognitive remediation/compensation;Community reintegration;Discharge planning;Disease mangement/prevention;DME/adaptive equipment instruction;Functional electrical stimulation;Functional mobility training;Neuromuscular re-education;Pain management;Patient/family education;Psychosocial support;Self Care/advanced ADL retraining;Skin care/wound managment;Splinting/orthotics;Therapeutic Activities;Therapeutic Exercise;UE/LE Strength taining/ROM;UE/LE Coordination activities;Visual/perceptual remediation/compensation;Wheelchair propulsion/positioning OT Self Feeding Anticipated Outcome(s): indep OT Basic Self-Care Anticipated Outcome(s): Supervision OT Toileting Anticipated Outcome(s): supervision OT Bathroom Transfers Anticipated Outcome(s): CGA OT Recommendation Recommendations for Other Services: Neuropsych consult;Therapeutic Recreation consult Therapeutic Recreation Interventions: Stress management;Outing/community reintergration Patient destination: Home Follow Up Recommendations: Home health OT Equipment Details: pt has most DME and AE   OT Evaluation Precautions/Restrictions  Precautions Precautions: Fall Precaution Comments: R foot drop- R CAM boot vs. shoe with AFO (shoe doesnt fit);  watch HR Required Braces or Orthoses: Other Brace Other Brace: R PRAFO Restrictions Weight Bearing Restrictions: No General Chart Reviewed: Yes Family/Caregiver Present: No Vital Signs Therapy Vitals Temp: 98 F (36.7 C) Resp: 18 BP: 112/68 Patient Position (if appropriate): Lying Oxygen Therapy SpO2: 100 % O2 Device: Nasal Cannula Pain Pain Assessment Pain Scale: 0-10 Pain Score: 8  Pain  Type: Neuropathic pain;Acute pain Pain Location: Foot Pain Orientation: Right Pain Descriptors / Indicators: Burning;Tingling;Tender;Sharp Pain Frequency: Constant Pain Onset: On-going Patients Stated Pain Goal: 2 Pain Intervention(s): Pain med given for lower pain score than stated, per patient request;Repositioned;Rest;Elevated extremity Multiple Pain Sites: No Home Living/Prior Functioning Home Living Available Help at Discharge: Family Type of Home: House Home Access: Stairs to enter, Level entry Entrance Stairs-Number of Steps: 5-6 steps at regular entrance Entrance Stairs-Rails: Right, Left, Can reach both Home Layout: Able to live on main level with bedroom/bathroom, One level Alternate Level Stairs-Number of Steps: 2 steps at back door Alternate Level Stairs-Rails: None Bathroom Shower/Tub: Engineer, manufacturing systems: Handicapped height Bathroom Accessibility: Yes Additional Comments: Recieving HH nursing care following return home from SNF rehab.,  Lives With: Family Prior Function Level of Independence: Independent with basic ADLs, Independent with homemaking with ambulation, Independent with gait, Independent with transfers  Able to Take Stairs?: Yes Driving: Yes Vocation: Full time employment Vocation Requirements: Works as a Midwife. Independent for ADLs/IADLs. Sponge bathing due to dialysis port Leisure: Hobbies-yes (Comment) (music) Vision Baseline Vision/History: 0 No visual deficits Ability to See in Adequate Light: 0 Adequate Patient Visual Report: No change from baseline Vision Assessment?: No apparent visual deficits Perception  Perception: Within Functional Limits Praxis Praxis: WFL Cognition Cognition Overall Cognitive Status: Within Functional Limits for tasks assessed Orientation Level: Person;Place;Situation Memory: Appears intact Awareness: Appears intact Problem Solving: Appears intact Safety/Judgment: Appears intact Brief Interview  for Mental Status (BIMS) Repetition of Three Words (First Attempt): 3 Temporal Orientation: Year: Correct Temporal Orientation: Month: Accurate within 5 days Temporal Orientation: Day: Correct Recall: "Sock": Yes, no cue required Recall: "Blue": Yes, no cue required Recall: "Bed": Yes, no cue required BIMS Summary Score: 15 Sensation Sensation Light Touch: Impaired by gross assessment Hot/Cold: Impaired by gross assessment Proprioception: Appears Intact Stereognosis: Appears Intact Coordination Gross Motor Movements are Fluid and Coordinated: No Fine Motor Movements are Fluid and Coordinated: Yes Coordination and  Movement Description: LE sensory deficits and gen weakness and debility impacts motor control Finger Nose Finger Test: intact Bly 9 Hole Peg Test: 9HPT 25 sec L, 27 sec R Motor  Motor Motor: Other (comment) Motor - Skilled Clinical Observations: increased body habitus, sensory and motor weaknes, R foot drp, R RTC weakness  Trunk/Postural Assessment  Cervical Assessment Cervical Assessment: Within Functional Limits Thoracic Assessment Thoracic Assessment: Within Functional Limits Lumbar Assessment Lumbar Assessment: Within Functional Limits Postural Control Postural Control: Deficits on evaluation Righting Reactions: delayed due to prolonged debility and bed rest  Balance Balance Balance Assessed: Yes Static Sitting Balance Static Sitting - Balance Support: Feet supported Static Sitting - Level of Assistance: 5: Stand by assistance Dynamic Sitting Balance Dynamic Sitting - Balance Support: Feet supported Dynamic Sitting - Level of Assistance: 5: Stand by assistance Static Standing Balance Static Standing - Balance Support: Bilateral upper extremity supported Static Standing - Level of Assistance: 4: Min assist Dynamic Standing Balance Dynamic Standing - Balance Support: Bilateral upper extremity supported Dynamic Standing - Level of Assistance: 3: Mod  assist Extremity/Trunk Assessment RUE Assessment RUE Assessment: Exceptions to [facility] Active Range of Motion (AROM) Comments: 0-45 R sh General Strength Comments: 2-/5 R sh, Grip 115 R LUE Assessment LUE Assessment: Within Functional Limits General Strength Comments: Grip  110 L  Care Tool Care Tool Self Care Eating   Eating Assist Level: Set up assist    Oral Care    Oral Care Assist Level: Set up assist    Bathing   Body parts bathed by patient: Right arm;Left arm;Chest;Abdomen;Front perineal area;Face Body parts bathed by helper: Left lower leg;Right lower leg;Left upper leg;Buttocks;Right upper leg   Assist Level: Maximal Assistance - Patient 24 - 49%    Upper Body Dressing(including orthotics)   What is the patient wearing?: Hospital gown only   Assist Level: Minimal Assistance - Patient > 75%    Lower Body Dressing (excluding footwear)   What is the patient wearing?: Underwear/pull up;Hospital gown only Assist for lower body dressing: Maximal Assistance - Patient 25 - 49%    Putting on/Taking off footwear   What is the patient wearing?: Shoes Assist for footwear: Total Assistance - Patient < 25%       Care Tool Toileting Toileting activity   Assist for toileting: Maximal Assistance - Patient 25 - 49%     Care Tool Bed Mobility Roll left and right activity   Roll left and right assist level: Minimal Assistance - Patient > 75%    Sit to lying activity   Sit to lying assist level: Minimal Assistance - Patient > 75%    Lying to sitting on side of bed activity   Lying to sitting on side of bed assist level: the ability to move from lying on the back to sitting on the side of the bed with no back support.: Maximal Assistance - Patient 25 - 49%     Care Tool Transfers Sit to stand transfer   Sit to stand assist level: Moderate Assistance - Patient 50 - 74%    Chair/bed transfer   Chair/bed transfer assist level: Moderate Assistance - Patient 50 - 74%      Toilet transfer   Assist Level: Moderate Assistance - Patient 50 - 74%     Care Tool Cognition  Expression of Ideas and Wants Expression of Ideas and Wants: 4. Without difficulty (complex and basic) - expresses complex messages without difficulty and with speech that is clear and easy to  understand  Understanding Verbal and Non-Verbal Content Understanding Verbal and Non-Verbal Content: 4. Understands (complex and basic) - clear comprehension without cues or repetitions   Memory/Recall Ability Memory/Recall Ability : Current season;That he or she is in a hospital/hospital unit   Refer to Care Plan for Long Term Goals  SHORT TERM GOAL WEEK 1 OT Short Term Goal 1 (Week 1): Pt will perform toilet and shower transfers with min a with LRAD OT Short Term Goal 2 (Week 1): Pt will complete LB bathing with LH sponge and min a OT Short Term Goal 3 (Week 1): Pt will stand at sink side for oral care for 2 min with min a  Recommendations for other services: Neuropsych and Therapeutic Recreation  Stress management and Outing/community reintegration   Skilled Therapeutic Intervention ADL ADL Equipment Provided: Long-handled sponge Eating: Set up Where Assessed-Eating: Edge of bed Grooming: Setup Where Assessed-Grooming: Edge of bed Upper Body Bathing: Minimal assistance Where Assessed-Upper Body Bathing: Edge of bed Lower Body Bathing: Maximal assistance Where Assessed-Lower Body Bathing: Edge of bed;Bed level Upper Body Dressing: Minimal assistance Where Assessed-Upper Body Dressing: Edge of bed Lower Body Dressing: Maximal assistance Where Assessed-Lower Body Dressing: Edge of bed;Bed level Toileting: Moderate assistance Where Assessed-Toileting: Teacher, adult education: Moderate assistance Toilet Transfer Method: Proofreader: Extra wide Gaffer: Unable to assess Film/video editor: Unable to assess ADL Comments: increased body  habitus for reach to buttocks and LE's thus max A LB, CGA UB, min A x 2 for safety for RW transfers to bari w/c and commode Mobility  Bed Mobility Bed Mobility: Rolling Right;Rolling Left;Left Sidelying to Sit;Supine to Sit;Sit to Supine Rolling Right: Contact Guard/Touching assist Rolling Left: Contact Guard/Touching assist Left Sidelying to Sit: Minimal Assistance - Patient >75% Supine to Sit: Minimal Assistance - Patient > 75% Sit to Supine: Maximal Assistance - Patient 25-49% Transfers Sit to Stand: Minimal Assistance - Patient > 75% Stand to Sit: Minimal Assistance - Patient > 75%  OT Treatment/Intervention:  Pt seen for full initial OT evaluation and training session this am. Pt amb with significant assist of 2 NT and nurse from bathroom upon OT arrival. Once pt moved from EOB to supine for brief rest due to pt reporting significant fatigue, OT introduced role of therapy and purpose of session. Pt open to all presented assessment and training this visit. Pt 100% on 4 ltrs of O2 and decreased to 2 ltrs and remained at 98% during BADL's.  OT assisted and assessed ADL's, mobility, vision, sensation. cognition/lang, G/FMC, strength and balance throughout session. See above for levels. Pt with R prox shoulder weakness and distal R LE foot drop and PN. Pt highly fearful of shower for risk of chest port infection despite MD clearance with waterproofing. Pt reports having AE and DME already in home. Pt will benefit from skilled OT services at [facility] to maximize function and safety with recommendation to return home with family support, S level and HHOT upon d/c home. Pt left at end of session in bed, R PRAFO donned and instructed in use and bed alarm set, tray table and nurse call bell within reach.   Discharge Criteria: Patient will be discharged from OT if patient refuses treatment 3 consecutive times without medical reason, if treatment goals not met, if there is a change in medical status, if patient  makes no progress towards goals or if patient is discharged from hospital.  The above assessment, treatment plan, treatment alternatives and goals were  discussed and mutually agreed upon: by patient  Vicenta Dunning 06/04/2023, 4:20 PM

## 2023-06-04 NOTE — Progress Notes (Signed)
Inpatient Rehabilitation Admission Medication Review by a Pharmacist  A complete drug regimen review was completed for this patient to identify any potential clinically significant medication issues.  High Risk Drug Classes Is patient taking? Indication by Medication  Antipsychotic No   Anticoagulant Yes Eliquis - atrial fibrillation   Antibiotic Yes Ancef - MSSA bacteremia  Opioid Yes Dilaudid, Oxycodone - pain  Antiplatelet No   Hypoglycemics/insulin No   Vasoactive Medication Yes Midodrine - orthostatic hypotension  Chemotherapy No   Other Yes Arenesp - anemia Gabapentin - neuropathic pain Fosrenol - phosphate binder Synthroid - hypothyroidism Melatonin - sleep Zofran - nausea/vomiting      Type of Medication Issue Identified Description of Issue Recommendation(s)  Drug Interaction(s) (clinically significant)     Duplicate Therapy     Allergy     No Medication Administration End Date     Incorrect Dose     Additional Drug Therapy Needed     Significant med changes from prior encounter (inform family/care partners about these prior to discharge). Digoxin and haldol on PTA med list Assess for the need for re-initiation post discharge if appropriate  Other       Clinically significant medication issues were identified that warrant physician communication and completion of prescribed/recommended actions by midnight of the next day:  No  Name of provider notified for urgent issues identified:   Provider Method of Notification:   Pharmacist comments:   Time spent performing this drug regimen review (minutes):  20 minutes   Alinda Money L Rudisill 06/03/2023 1:19 PM

## 2023-06-04 NOTE — Progress Notes (Signed)
PROGRESS NOTE   Subjective/Complaints:  No new pains, pt cannot move Right foot since prolonged ICU stay and LTAC stay   ROS- neg CP, SOB, N/V?D  Objective:   No results found. Recent Labs    06/03/23 0806  WBC 6.1  HGB 9.4*  HCT 30.7*  PLT 349   Recent Labs    06/02/23 0445 06/03/23 0419  NA 135 135  K 3.9 4.0  CL 98 98  CO2 26 23  GLUCOSE 87 98  BUN 18 26*  CREATININE 7.76* 9.38*  CALCIUM 9.9 10.4*    Intake/Output Summary (Last 24 hours) at 06/04/2023 0854 Last data filed at 06/04/2023 0845 Gross per 24 hour  Intake 656 ml  Output --  Net 656 ml     Pressure Injury 05/18/23 Buttocks Right Stage 3 -  Full thickness tissue loss. Subcutaneous fat may be visible but bone, tendon or muscle are NOT exposed. (Active)  05/18/23 2300  Location: Buttocks  Location Orientation: Right  Staging: Stage 3 -  Full thickness tissue loss. Subcutaneous fat may be visible but bone, tendon or muscle are NOT exposed.  Wound Description (Comments):   Present on Admission:      Pressure Injury 05/18/23 Buttocks Left Stage 2 -  Partial thickness loss of dermis presenting as a shallow open injury with a red, pink wound bed without slough. (Active)  05/18/23 2300  Location: Buttocks  Location Orientation: Left  Staging: Stage 2 -  Partial thickness loss of dermis presenting as a shallow open injury with a red, pink wound bed without slough.  Wound Description (Comments):   Present on Admission: Yes    Physical Exam: Vital Signs Blood pressure 123/60, pulse 84, temperature 97.7 F (36.5 C), resp. rate 19, height 6' (1.829 m), weight (!) 156.3 kg, SpO2 100%.  General: No acute distress Mood and affect are appropriate Heart: Regular rate and rhythm no rubs murmurs or extra sounds Lungs: Clear to auscultation, breathing unlabored, no rales or wheezes Abdomen: Positive bowel sounds, soft nontender to palpation,  nondistended Extremities: No clubbing, cyanosis, or edema Skin: No evidence of breakdown, no evidence of rash Right gluteal , probed with Q tip 3cm , no foul smell     Left hip  Neurologic: Cranial nerves II through XII intact, motor strength is 5/5 in bilateral deltoid, bicep, tricep, grip, 0/5 right  ankle dorsiflexor and plantar flexor Sensory exam absent LT sensation RIght foot/ankle  Left foot with hyperesthesia to LT  Musculoskeletal: Full range of motion in all 4 extremities. No joint swelling    Assessment/Plan: 1. Functional deficits which require 3+ hours per day of interdisciplinary therapy in a comprehensive inpatient rehab setting. Physiatrist is providing close team supervision and 24 hour management of active medical problems listed below. Physiatrist and rehab team continue to assess barriers to discharge/monitor patient progress toward functional and medical goals  Care Tool:  Bathing              Bathing assist       Upper Body Dressing/Undressing Upper body dressing        Upper body assist      Lower Body Dressing/Undressing Lower body  dressing            Lower body assist       Toileting Toileting    Toileting assist       Transfers Chair/bed transfer  Transfers assist           Locomotion Ambulation   Ambulation assist              Walk 10 feet activity   Assist           Walk 50 feet activity   Assist           Walk 150 feet activity   Assist           Walk 10 feet on uneven surface  activity   Assist           Wheelchair     Assist               Wheelchair 50 feet with 2 turns activity    Assist            Wheelchair 150 feet activity     Assist          Blood pressure 123/60, pulse 84, temperature 97.7 F (36.5 C), resp. rate 19, height 6' (1.829 m), weight (!) 156.3 kg, SpO2 100%.  Medical Problem List and Plan: 1. Functional deficits  secondary to debility from MSSA bacteremia, c/b multiple medical comorbidities             -patient may shower with buttocks wound and lines covered             -ELOS/Goals: 16-18 days, supervision PT/OT              - Stable to admit to CIR   2.  Antithrombotics: -DVT/anticoagulation:  Pharmaceutical: Eliquis 5 mg BID             -antiplatelet therapy: none   3. Pain Management: Tylenol as needed             -PO Dilaudid initiated on acute side due to nausea with oxycodone--> On admission patient requests to switch back, changed back to oxycodone 15 mg Q4H PRN and Dced Dilaudid              - For neuropathic pain gabapentin 300 mg MWF              4. Mood/Behavior/Sleep: LCSW to evaluate and provide emotional support             -continue melatonin 5 mg q HS             -antipsychotic agents: n/a              - Severe anxiety regarding traumatic medical cares from recent ICU encounter; please explain every intervention and clarify questions prior to initiating    5. Neuropsych/cognition: This patient is capable of making decisions on his own behalf.              - Would benefit from neuropsych consult due to above   6. Skin/Wound Care: Routine skin care checks             -pressure injury left buttock/posterior thigh>>continue wound care              - Offered LAL mattress given HX pressure wounds; patient declines- RIght buttocks wound has been healing well with standard mattress   7. Fluids/Electrolytes/Nutrition: Routine Is and Os and follow-up chemistries -Renal diet with  1200 cc fluid restriction             -Continue Fosrenol 2000 mg 3 times daily with meals   8: Hypotension: monitor TID and prn             -continue midodrine 30 mg 3 times daily with meals   9: pAF: on Eliquis              -unable to tolerate amiodarone (requires midodrine for BP so not a candidate for nodal agents)   10: Hypothyroidism: Continue Synthroid 25 mcg daily   11: Chronic systolic heart  failure: EF ~30-35%             -continue digoxin 0.125 mg  3 x weekly             -daily weight             -volume status>>HD 3 x weekly   12: Chronic hypoxic respiratory failure: on home O2 5 L via [place]   13: LE neuropathy/ R foot drop: CAM boot              - Pattern of weakness and sensory loss consistent with sciatic neuropathy above the knee (sparing hamstrings); discussed this with patient on admission, likely compressive from ICU stay  - hopeful for improvement and likely will need EMG as OP   14: Obesity: BMI 46.52   15: Anemia: on ESP   16: History or HIT positivity>>no heparin   17: MSSA bacteremia: will continue Ancef 2 g with HD treatment 3x/week (through 12/09)   18. Vocal hoarseness s/p extubation. Consider ENT evaluation if no improvement.   19.  ESRD on HD- nephro to manage  LOS: 1 days A FACE TO FACE EVALUATION WAS PERFORMED  Erick Colace 06/04/2023, 8:54 AM

## 2023-06-05 NOTE — IPOC Note (Signed)
Overall Plan of Care [facility]) Patient Details Name: Phillip Young MRN: [id number] DOB: 1985-08-09  Admitting Diagnosis: Debility  Hospital Problems: Principal Problem:   Debility     Functional Problem List: Nursing Endurance, Bowel, Medication Management, Pain, Safety, Edema, Skin Integrity  PT Balance, Safety, Behavior, Edema, Sensory, Skin Integrity, Endurance, Motor, Nutrition, Pain  OT Balance, Edema, Endurance, Pain, Sensory, Motor, Skin Integrity  SLP    TR         Basic ADL's: OT Grooming, Bathing, Dressing, Toileting     Advanced  ADL's: OT Simple Meal Preparation, Light Housekeeping, Laundry     Transfers: PT Bed Mobility, Bed to Chair, Set designer, Occupational psychologist, Research scientist (life sciences): PT Ambulation, Stairs     Additional Impairments: OT Fuctional Use of Upper Extremity  SLP        TR      Anticipated Outcomes Item Anticipated Outcome  Self Feeding indep  Swallowing      Basic self-care  Supervision  Toileting  supervision   Bathroom Transfers CGA  Bowel/Bladder  manage bowel w mod I assist  Transfers  supervision  Locomotion  CGA  Communication     Cognition     Pain  < 4 with prns  Safety/Judgment  manage w cues   Therapy Plan: PT Intensity: Minimum of 1-2 x/day ,45 to 90 minutes PT Frequency: 5 out of 7 days PT Duration Estimated Length of Stay: 18-21 days OT Intensity: Minimum of 1-2 x/day, 45 to 90 minutes OT Frequency: 5 out of 7 days OT Duration/Estimated Length of Stay: 2.5-3 weeks     Team Interventions: Nursing Interventions Patient/Family Education, Bowel Management, Disease Management/Prevention, Pain Management, Medication Management, Skin Care/Wound Management, Discharge Planning  PT interventions Ambulation/gait training, Community reintegration, DME/adaptive equipment instruction, Neuromuscular re-education, Psychosocial support, Stair training, UE/LE Strength taining/ROM, Wheelchair propulsion/positioning, UE/LE  Coordination activities, Therapeutic Activities, Skin care/wound management, Pain management, Functional electrical stimulation, Discharge planning, Warden/ranger, Cognitive remediation/compensation, Disease management/prevention, Functional mobility training, Patient/family education, Splinting/orthotics, Therapeutic Exercise, Visual/perceptual remediation/compensation  OT Interventions Warden/ranger, Cognitive remediation/compensation, Community reintegration, Discharge planning, Disease mangement/prevention, DME/adaptive equipment instruction, Functional electrical stimulation, Functional mobility training, Neuromuscular re-education, Pain management, Patient/family education, Psychosocial support, Self Care/advanced ADL retraining, Skin care/wound managment, Splinting/orthotics, Therapeutic Activities, Therapeutic Exercise, UE/LE Strength taining/ROM, UE/LE Coordination activities, Visual/perceptual remediation/compensation, Wheelchair propulsion/positioning  SLP Interventions    TR Interventions    SW/CM Interventions     Barriers to Discharge MD  Medical stability, IV antibiotics, Wound care, Weight, Hemodialysis, and Medication compliance  Nursing Decreased caregiver support, Home environment access/layout, IV antibiotics, Hemodialysis, New oxygen, Other (comments), Wound Care 2 level 4-5 ste in front, level entry in back main B+B,prior to June was independent, since hospitalization, LTACH, and rehab he has been supervision, at best, with significantly decreased CP endurance, no DME at baseline, now with RW;discharge to pt's residence ([address] in [place]), mom can provide supervision if needed.  Will need to arrange HD transportion.Home Equipment: Wheelchair - manual, Hospital bed, Agricultural consultant (2 wheels), Other (comment) (hoyer lift and sling)  PT Inaccessible home environment, Decreased caregiver support, Home environment access/layout, Neurogenic Bowel &  Bladder, Wound Care, Insurance for SNF coverage, Weight, Hemodialysis, Medication compliance, Nutrition means    OT Inaccessible home environment, Decreased caregiver support, Home environment access/layout, Wound Care, Weight, Hemodialysis, New oxygen O2, increased body habitus, wounds, HD, ste, family works  SLP      [employer]  Discharge Planning: Destination: PT-Home ,OT- Home , SLP-  Projected Follow-up: PT-Home health PT, 24 hour supervision/assistance, OT-  Home health OT, SLP-  Projected Equipment Needs: PT-To be determined, OT-  , SLP-  Equipment Details: PT- , OT-pt has most DME and AE Patient/family involved in discharge planning: PT- Patient,  OT-Patient, SLP-   MD ELOS: 18-21d Medical Rehab Prognosis:  Good Assessment: The patient has been admitted for CIR therapies with the diagnosis of debility . The team will be addressing functional mobility, strength, stamina, balance, safety, adaptive techniques and equipment, self-care, bowel and bladder mgt, patient and caregiver education, ESRD on HD, Chronic pain management CHF. Goals have been set at [employer]. Anticipated discharge destination is Home.        See Team Conference Notes for weekly updates to the plan of care

## 2023-06-05 NOTE — Plan of Care (Signed)
  Problem: RH Balance Goal: LTG Patient will maintain dynamic standing balance (PT) Description: LTG:  Patient will maintain dynamic standing balance with assistance during mobility activities (PT) Flowsheets (Taken 06/04/2023 1753) LTG: Pt will maintain dynamic standing balance during mobility activities with:: Supervision/Verbal cueing   Problem: Sit to Stand Goal: LTG:  Patient will perform sit to stand with assistance level (PT) Description: LTG:  Patient will perform sit to stand with assistance level (PT) Flowsheets (Taken 06/04/2023 1753) LTG: PT will perform sit to stand in preparation for functional mobility with assistance level: Independent with assistive device   Problem: RH Bed Mobility Goal: LTG Patient will perform bed mobility with assist (PT) Description: LTG: Patient will perform bed mobility with assistance, with/without cues (PT). Flowsheets (Taken 06/04/2023 1753) LTG: Pt will perform bed mobility with assistance level of: Independent with assistive device    Problem: RH Bed to Chair Transfers Goal: LTG Patient will perform bed/chair transfers w/assist (PT) Description: LTG: Patient will perform bed to chair transfers with assistance (PT). Flowsheets (Taken 06/04/2023 1753) LTG: Pt will perform Bed to Chair Transfers with assistance level: Supervision/Verbal cueing   Problem: RH Car Transfers Goal: LTG Patient will perform car transfers with assist (PT) Description: LTG: Patient will perform car transfers with assistance (PT). Flowsheets (Taken 06/04/2023 1753) LTG: Pt will perform car transfers with assist:: Contact Guard/Touching assist   Problem: RH Furniture Transfers Goal: LTG Patient will perform furniture transfers w/assist (OT/PT) Description: LTG: Patient will perform furniture transfers  with assistance (OT/PT). Flowsheets (Taken 06/04/2023 1753) LTG: Pt will perform furniture transfers with assist:: Supervision/Verbal cueing   Problem: RH  Ambulation Goal: LTG Patient will ambulate in controlled environment (PT) Description: LTG: Patient will ambulate in a controlled environment, # of feet with assistance (PT). Flowsheets (Taken 06/04/2023 1753) LTG: Pt will ambulate in controlled environ  assist needed:: Supervision/Verbal cueing LTG: Ambulation distance in controlled environment: at least 75 ft using LRAD Goal: LTG Patient will ambulate in home environment (PT) Description: LTG: Patient will ambulate in home environment, # of feet with assistance (PT). Flowsheets (Taken 06/04/2023 1753) LTG: Pt will ambulate in home environ  assist needed:: Supervision/Verbal cueing LTG: Ambulation distance in home environment: up to 50 ft per bout using LRAD   Problem: RH Stairs Goal: LTG Patient will ambulate up and down stairs w/assist (PT) Description: LTG: Patient will ambulate up and down # of stairs with assistance (PT) Flowsheets (Taken 06/04/2023 1753) LTG: Pt will ambulate up/down stairs assist needed:: Contact Guard/Touching assist LTG: Pt will  ambulate up and down number of stairs: at least 5 steps using HR setup as per home environment

## 2023-06-05 NOTE — Progress Notes (Signed)
Patient ID: Phillip Young, male   DOB: [birth year], 36 y.o.   MRN: [id number] [facility] Progress Note   Assessment/ Plan:   1. Acute hypoxic respiratory failure: Remains on supplemental oxygen and ongoing efforts at continued ultrafiltration/lowering EDW with dialysis (anticipating that he has suffered weight loss during this hospitalization). 2. MSSA Bacteremia: TDC replaced after he underwent line holiday 05/26/23-05/30/23.  Continue cefazolin IV 2. ESRD: On hemodialysis on a Monday/Wednesday/Friday schedule and utilizing sodium citrate for dialysis catheter lock given history of heparin-induced thrombocytopenia. 3. HTN/volume: He remains on high-dose midodrine with soft blood pressures; vital signs reviewed and I do not see the opportunity for decreasing midodrine dose at this time. 4. Anemia: Hemoglobin/hematocrit with slight downward trend, continue high-dose ESA weekly. 5. Secondary hyperparathyroidism: Calcium level elevated and VDRA remains on hold, continue lanthanum for phosphorus binding/control. 6. Nutrition:  Continue renal diet and protein supplements    Subjective:   Reports that he is glad to be in the inpatient rehabilitation unit.  Episodes of nausea/vomiting noted overnight and discussed his refusal of chlorhexidine bath.  He inquires about ability to discontinue midodrine.   Objective:   BP 114/64 (BP Location: Left Arm)   Pulse 68   Temp 97.9 F (36.6 C)   Resp 20   Ht 6' (1.829 m)   Wt (!) 155.2 kg Comment: Items off bed  SpO2 100%   BMI 46.40 kg/m   Physical Exam: Gen: Comfortably resting in bed, watching videos on phone CVS: Pulse regular rhythm, normal rate, S1 and S2 normal.  Right IJ TDC Resp: Anteriorly clear to auscultation, no rales/rhonchi.  On oxygen via nasal cannula Abd: Soft, obese, nontender, bowel sounds normal Ext: Chronic venous stasis changes but no pitting edema.  Labs: BMET Recent Labs  Lab 05/30/23 0448 05/31/23 0659  06/01/23 0516 06/02/23 0445 06/03/23 0419  NA 135 135 134* 135 135  K 4.8 4.5 4.3 3.9 4.0  CL 97* 97* 94* 98 98  CO2 25 25 22 26 23   GLUCOSE 87 80 85 87 98  BUN 48* 35* 46* 18 26*  CREATININE 11.66* 9.83* 10.79* 7.76* 9.38*  CALCIUM 10.0 10.0 10.2 9.9 10.4*  PHOS  --  4.9* 5.7* 4.7* 4.8*   CBC Recent Labs  Lab 06/01/23 0720 06/03/23 0806  WBC 6.3 6.1  HGB 10.2* 9.4*  HCT 32.6* 30.7*  MCV 99.1 100.0  PLT 305 349      Medications:     apixaban  5 mg Oral BID   Chlorhexidine Gluconate Cloth  6 each Topical Q0600   darbepoetin (ARANESP) injection - DIALYSIS  150 mcg Subcutaneous Q Sat-1800   [START ON 06/06/2023] digoxin  0.125 mg Oral Once per day on Monday Wednesday Friday   feeding supplement (NEPRO CARB STEADY)  237 mL Oral BID BM   [START ON 06/06/2023] gabapentin  300 mg Oral Q M,W,F-HD   Gerhardt's butt cream   Topical TID   lanthanum  2,000 mg Oral TID WC   leptospermum manuka honey  1 Application Topical Daily   levothyroxine  25 mcg Oral Q0600   midodrine  30 mg Oral TID WC   urea   Topical Daily   Zetta Bills, MD 06/05/2023, 10:26 AM

## 2023-06-05 NOTE — Progress Notes (Signed)
PROGRESS NOTE   Subjective/Complaints: Nausea and emesis last noc, patient thinks it was after taking his pain medications.  We discussed reducing pain medication however he just would prefer not to take it as often. In addition the patient is concerned about the midodrine that also sometimes causes nausea because of its texture.  He states that when he is not in the hospital he generally does not take this.  We discussed that this dose should be discussed with nephrology.   ROS- neg CP, SOB, N/V/D  Objective:   No results found. Recent Labs    06/03/23 0806  WBC 6.1  HGB 9.4*  HCT 30.7*  PLT 349   Recent Labs    06/03/23 0419  NA 135  K 4.0  CL 98  CO2 23  GLUCOSE 98  BUN 26*  CREATININE 9.38*  CALCIUM 10.4*    Intake/Output Summary (Last 24 hours) at 06/05/2023 0853 Last data filed at 06/04/2023 2300 Gross per 24 hour  Intake 360 ml  Output 750 ml  Net -390 ml     Pressure Injury 05/18/23 Buttocks Right Stage 3 -  Full thickness tissue loss. Subcutaneous fat may be visible but bone, tendon or muscle are NOT exposed. (Active)  05/18/23 2300  Location: Buttocks  Location Orientation: Right  Staging: Stage 3 -  Full thickness tissue loss. Subcutaneous fat may be visible but bone, tendon or muscle are NOT exposed.  Wound Description (Comments):   Present on Admission:      Pressure Injury 05/18/23 Buttocks Left Stage 2 -  Partial thickness loss of dermis presenting as a shallow open injury with a red, pink wound bed without slough. (Active)  05/18/23 2300  Location: Buttocks  Location Orientation: Left  Staging: Stage 2 -  Partial thickness loss of dermis presenting as a shallow open injury with a red, pink wound bed without slough.  Wound Description (Comments):   Present on Admission: Yes    Physical Exam: Vital Signs Blood pressure 114/64, pulse 68, temperature 97.9 F (36.6 C), resp. rate 20,  height 6' (1.829 m), weight (!) 155.2 kg, SpO2 100%.  General: No acute distress Mood and affect are appropriate Heart: Regular rate and rhythm no rubs murmurs or extra sounds Lungs: Clear to auscultation, breathing unlabored, no rales or wheezes Abdomen: Positive bowel sounds, soft nontender to palpation, nondistended Extremities: No clubbing, cyanosis, or edema Skin: No evidence of breakdown, no evidence of rash Right gluteal , probed with Q tip 3cm , no foul smell     Left hip  Neurologic: Cranial nerves II through XII intact, motor strength is 5/5 in bilateral deltoid, bicep, tricep, grip, 0/5 right  ankle dorsiflexor and plantar flexor Sensory exam absent LT sensation RIght foot/ankle  Left foot with hyperesthesia to LT  Musculoskeletal: Full range of motion in all 4 extremities. No joint swelling  Assessment/Plan: 1. Functional deficits which require 3+ hours per day of interdisciplinary therapy in a comprehensive inpatient rehab setting. Physiatrist is providing close team supervision and 24 hour management of active medical problems listed below. Physiatrist and rehab team continue to assess barriers to discharge/monitor patient progress toward functional and medical goals  Care  Tool:  Bathing    Body parts bathed by patient: Right arm, Left arm, Chest, Abdomen, Front perineal area, Face   Body parts bathed by helper: Left lower leg, Right lower leg, Left upper leg, Buttocks, Right upper leg     Bathing assist Assist Level: Maximal Assistance - Patient 24 - 49%     Upper Body Dressing/Undressing Upper body dressing   What is the patient wearing?: Hospital gown only    Upper body assist Assist Level: Minimal Assistance - Patient > 75%    Lower Body Dressing/Undressing Lower body dressing      What is the patient wearing?: Underwear/pull up, Hospital gown only     Lower body assist Assist for lower body dressing: Maximal Assistance - Patient 25 - 49%      Toileting Toileting    Toileting assist Assist for toileting: Maximal Assistance - Patient 25 - 49%     Transfers Chair/bed transfer  Transfers assist     Chair/bed transfer assist level: Moderate Assistance - Patient 50 - 74%     Locomotion Ambulation   Ambulation assist      Assist level: Minimal Assistance - Patient > 75% Assistive device: Walker-rolling Max distance: 8 ft   Walk 10 feet activity   Assist  Walk 10 feet activity did not occur: Safety/medical concerns        Walk 50 feet activity   Assist Walk 50 feet with 2 turns activity did not occur: Safety/medical concerns         Walk 150 feet activity   Assist Walk 150 feet activity did not occur: Safety/medical concerns         Walk 10 feet on uneven surface  activity   Assist Walk 10 feet on uneven surfaces activity did not occur: Safety/medical concerns         Wheelchair     Assist Is the patient using a wheelchair?: Yes Type of Wheelchair: Manual    Wheelchair assist level: Dependent - Patient 0% Max wheelchair distance: 400 ft    Wheelchair 50 feet with 2 turns activity    Assist        Assist Level: Dependent - Patient 0%   Wheelchair 150 feet activity     Assist      Assist Level: Dependent - Patient 0%   Blood pressure 114/64, pulse 68, temperature 97.9 F (36.6 C), resp. rate 20, height 6' (1.829 m), weight (!) 155.2 kg, SpO2 100%.  Medical Problem List and Plan: 1. Functional deficits secondary to debility from MSSA bacteremia, c/b multiple medical comorbidities             -patient may shower with buttocks wound and lines covered             -ELOS/Goals: 16-18 days, supervision PT/OT              - Stable to admit to CIR   2.  Antithrombotics: -DVT/anticoagulation:  Pharmaceutical: Eliquis 5 mg BID             -antiplatelet therapy: none   3. Pain Management: Tylenol as needed             -PO Dilaudid initiated on acute side due to  nausea with oxycodone--> On admission patient requests to switch back, changed back to oxycodone 15 mg Q4H PRN and Dced Dilaudid, patient states that he will not take his oxycodone every 4 hours              -  For neuropathic pain gabapentin 300 mg MWF              4. Mood/Behavior/Sleep: LCSW to evaluate and provide emotional support             -continue melatonin 5 mg q HS             -antipsychotic agents: n/a              - Severe anxiety regarding traumatic medical cares from recent ICU encounter; please explain every intervention and clarify questions prior to initiating    5. Neuropsych/cognition: This patient is capable of making decisions on his own behalf.              - Would benefit from neuropsych consult due to above   6. Skin/Wound Care: Routine skin care checks             -pressure injury left buttock/posterior thigh>>continue wound care              - Offered LAL mattress given HX pressure wounds; patient declines- RIght buttocks wound has been healing well with standard mattress   7. Fluids/Electrolytes/Nutrition: Routine Is and Os and follow-up chemistries -Renal diet with 1200 cc fluid restriction             -Continue Fosrenol 2000 mg 3 times daily with meals   8: Hypotension: monitor TID and prn             -continue midodrine 30 mg 3 times daily with meals Encourage patient to discuss dosing with nephrology.  We discussed the danger of reducing this or stopping this in terms of not being able to tolerate therapy, will check orthostatic vitals 9: pAF: on Eliquis              -unable to tolerate amiodarone (requires midodrine for BP so not a candidate for nodal agents)   10: Hypothyroidism: Continue Synthroid 25 mcg daily   11: Chronic systolic heart failure: EF ~30-35%             -continue digoxin 0.125 mg  3 x weekly             -daily weight             -volume status>>HD 3 x weekly   12: Chronic hypoxic respiratory failure: on home O2 5 L via [place]   13: LE  neuropathy/ R foot drop: CAM boot              - Pattern of weakness and sensory loss consistent with sciatic neuropathy above the knee (sparing hamstrings); discussed this with patient on admission, likely compressive from ICU stay  - hopeful for improvement and likely will need EMG as OP   14: Obesity: BMI 46.52   15: Anemia: on ESP   16: History or HIT positivity>>no heparin   17: MSSA bacteremia: will continue Ancef 2 g with HD treatment 3x/week (through 12/09)   18. Vocal hoarseness s/p extubation. Consider ENT evaluation if no improvement.   19.  ESRD on HD- nephro to manage  LOS: 2 days A FACE TO FACE EVALUATION WAS PERFORMED  Erick Colace 06/05/2023, 8:53 AM

## 2023-06-05 NOTE — Progress Notes (Addendum)
Refused CHG Bath this morning. Attempting to explain has be completed twice in a 24-hour period with Charge RN in room. Expressed that he had a CHG bath completed yesterday evening this was the reason for refusal this morning. Day Nurse and Provider for the day secured chat sent updating them about patient refusing CHG Bath.

## 2023-06-06 DIAGNOSIS — F419 Anxiety disorder, unspecified: Secondary | ICD-10-CM | POA: Diagnosis not present

## 2023-06-06 DIAGNOSIS — G4700 Insomnia, unspecified: Secondary | ICD-10-CM | POA: Diagnosis not present

## 2023-06-06 DIAGNOSIS — R5381 Other malaise: Secondary | ICD-10-CM | POA: Diagnosis not present

## 2023-06-06 DIAGNOSIS — I10 Essential (primary) hypertension: Secondary | ICD-10-CM | POA: Diagnosis not present

## 2023-06-06 DIAGNOSIS — M792 Neuralgia and neuritis, unspecified: Secondary | ICD-10-CM

## 2023-06-06 LAB — RENAL FUNCTION PANEL
Albumin: 2.8 g/dL — ABNORMAL LOW (ref 3.5–5.0)
Anion gap: 16 — ABNORMAL HIGH (ref 5–15)
BUN: 29 mg/dL — ABNORMAL HIGH (ref 6–20)
CO2: 24 mmol/L (ref 22–32)
Calcium: 10.1 mg/dL (ref 8.9–10.3)
Chloride: 97 mmol/L — ABNORMAL LOW (ref 98–111)
Creatinine, Ser: 10.18 mg/dL — ABNORMAL HIGH (ref 0.61–1.24)
GFR, Estimated: 6 mL/min — ABNORMAL LOW (ref >60)
Glucose, Bld: 97 mg/dL (ref 70–99)
Phosphorus: 4.6 mg/dL (ref 2.5–4.6)
Potassium: 3.9 mmol/L (ref 3.5–5.1)
Sodium: 137 mmol/L (ref 135–145)

## 2023-06-06 LAB — CBC
HCT: 33.2 % — ABNORMAL LOW (ref 39.0–52.0)
Hemoglobin: 10.3 g/dL — ABNORMAL LOW (ref 13.0–17.0)
MCH: 31.1 pg (ref 26.0–34.0)
MCHC: 31 g/dL (ref 30.0–36.0)
MCV: 100.3 fL — ABNORMAL HIGH (ref 80.0–100.0)
Platelets: 345 10*3/uL (ref 150–400)
RBC: 3.31 MIL/uL — ABNORMAL LOW (ref 4.22–5.81)
RDW: 15.4 % (ref 11.5–15.5)
WBC: 9.3 10*3/uL (ref 4.0–10.5)
nRBC: 0 % (ref 0.0–0.2)

## 2023-06-06 MED ORDER — ANTICOAGULANT SODIUM CITRATE 4% (200MG/5ML) IV SOLN
5.0000 mL | Status: DC
  Administered 2023-06-06: 5 mL
  Administered 2023-06-08: 3.8 mL
  Filled 2023-06-06 (×10): qty 5

## 2023-06-06 MED ORDER — CHLORHEXIDINE GLUCONATE CLOTH 2 % EX PADS
6.0000 | MEDICATED_PAD | Freq: Two times a day (BID) | CUTANEOUS | Status: DC
  Administered 2023-06-07 – 2023-06-15 (×7): 6 via TOPICAL

## 2023-06-06 MED ORDER — MELATONIN 5 MG PO TABS
5.0000 mg | ORAL_TABLET | Freq: Every day | ORAL | Status: DC
  Administered 2023-06-09 – 2023-06-16 (×7): 5 mg via ORAL
  Filled 2023-06-06 (×11): qty 1

## 2023-06-06 MED ORDER — ALPRAZOLAM 0.25 MG PO TABS
0.2500 mg | ORAL_TABLET | Freq: Two times a day (BID) | ORAL | Status: DC | PRN
  Administered 2023-06-16 – 2023-06-19 (×2): 0.25 mg via ORAL
  Filled 2023-06-06 (×2): qty 1

## 2023-06-06 NOTE — Progress Notes (Signed)
Inpatient Rehabilitation Care Coordinator Assessment and Plan Patient Details  Name: Phillip Young MRN: [id number] Date of Birth: 10-Mar-1986  Today's Date: 06/06/2023  Hospital Problems: Principal Problem:   Debility  Past Medical History:  Past Medical History:  Diagnosis Date   Acute on chronic respiratory failure with hypoxia ([name]) 04/21/2021   Acute on chronic systolic (congestive) heart failure ([name]) 02/26/2020   Amiodarone toxicity    Anemia    Atrial flutter ([name])    Biventricular congestive heart failure ([name])    Chronic hypoxemic respiratory failure ([name])    Class 3 severe obesity due to excess calories with serious comorbidity and body mass index (BMI) of 50.0 to 59.9 in adult ([name]) 02/26/2020   Essential hypertension 02/26/2020   GERD without esophagitis 02/26/2020   Hidradenitis suppurativa 02/26/2020   NICM (nonischemic cardiomyopathy) ([name])    Obesity hypoventilation syndrome ([name])    OSA (obstructive sleep apnea)    PAF (paroxysmal atrial fibrillation) ([name])    Pneumonia    Prediabetes 02/26/2020   Past Surgical History:  Past Surgical History:  Procedure Laterality Date   ABSCESS DRAINAGE     AV FISTULA PLACEMENT Left 08/21/2021   Procedure: LEFT ARM ARTERIOVENOUS (AV) FISTULA.;  Surgeon: Nada Libman, MD;  Location: [facility] OR;  Service: Vascular;  Laterality: Left;   CARDIAC CATHETERIZATION     CARDIOVERSION N/A 10/09/2021   Procedure: CARDIOVERSION;  Surgeon: Laurey Morale, MD;  Location: [facility] ENDOSCOPY;  Service: Cardiovascular;  Laterality: N/A;   CARDIOVERSION N/A 05/28/2022   Procedure: CARDIOVERSION;  Surgeon: Laurey Morale, MD;  Location: [facility] ENDOSCOPY;  Service: Cardiovascular;  Laterality: N/A;   CARDIOVERSION N/A 06/07/2022   Procedure: CARDIOVERSION;  Surgeon: Laurey Morale, MD;  Location: [facility] ENDOSCOPY;  Service: Cardiovascular;  Laterality: N/A;   IR FLUORO GUIDE CV LINE RIGHT  03/10/2021   IR FLUORO GUIDE CV LINE RIGHT  04/22/2021    IR FLUORO GUIDE CV LINE RIGHT  08/20/2021   IR FLUORO GUIDE CV LINE RIGHT  03/01/2023   IR FLUORO GUIDE CV LINE RIGHT  03/09/2023   IR FLUORO GUIDE CV LINE RIGHT  05/30/2023   IR PTA VENOUS EXCEPT DIALYSIS CIRCUIT  03/09/2023   IR REMOVAL TUN CV CATH W/O FL  05/26/2023   IR REMOVE CV FIBRIN SHEATH  03/09/2023   IR US GUIDE VASC ACCESS RIGHT  03/10/2021   IR US GUIDE VASC ACCESS RIGHT  04/22/2021   IR US GUIDE VASC ACCESS RIGHT  05/30/2023   RIGHT HEART CATH N/A 03/06/2021   Procedure: RIGHT HEART CATH;  Surgeon: Dolores Patty, MD;  Location: [facility] INVASIVE CV LAB;  Service: Cardiovascular;  Laterality: N/A;   RIGHT/LEFT HEART CATH AND CORONARY ANGIOGRAPHY N/A 03/04/2020   Procedure: RIGHT/LEFT HEART CATH AND CORONARY ANGIOGRAPHY;  Surgeon: Laurey Morale, MD;  Location: [facility] INVASIVE CV LAB;  Service: Cardiovascular;  Laterality: N/A;   TEE WITHOUT CARDIOVERSION N/A 05/05/2021   Procedure: TRANSESOPHAGEAL ECHOCARDIOGRAM (TEE);  Surgeon: Laurey Morale, MD;  Location: [facility] ENDOSCOPY;  Service: Cardiovascular;  Laterality: N/A;   TEMPORARY DIALYSIS CATHETER  03/06/2021   Procedure: TEMPORARY DIALYSIS CATHETER;  Surgeon: Dolores Patty, MD;  Location: [facility] INVASIVE CV LAB;  Service: Cardiovascular;;   Social History:  reports that he quit smoking about 5 years ago. His smoking use included cigarettes. He has been exposed to tobacco smoke. He has quit using smokeless tobacco. He reports that he does not drink alcohol and does not use drugs.  Family / Support  Systems Marital Status: Single Spouse/Significant Other: n/a Children: n/a Other Supports: Mother, Phillip Young and Sister, Phillip Young Anticipated Caregiver: Mother (24/7) in addition to assistance from sister Ability/Limitations of Caregiver: supervision only Caregiver Availability: 24/7 Family Dynamics: supportive mother and sister  Social History Preferred language: English Religion: Non-Denominational Cultural Background:  Independent prior to July 2024. Baseline with RW Education: HS Health Literacy - How often do you need to have someone help you when you read instructions, pamphlets, or other written material from your doctor or pharmacy?: Rarely Writes: Yes Employment Status: Unemployed Date Retired/Disabled/Unemployed: n/a Marine scientist Issues: n/a Guardian/Conservator: n/a   Abuse/Neglect Abuse/Neglect Assessment Can Be Completed: Yes Physical Abuse: Denies Verbal Abuse: Denies Sexual Abuse: Denies Exploitation of patient/patient's resources: Denies Self-Neglect: Denies  Patient response to: Social Isolation - How often do you feel lonely or isolated from those around you?: Never  Emotional Status    Patient / Family Perceptions, Expectations & Goals Pt/Family understanding of illness & functional limitations: yes Premorbid pt/family roles/activities: Independent prior to July 2024. LTACH admission. Use of Ad after admission Anticipated changes in roles/activities/participation: Patient anticpates discharging with his mother at supervision level Pt/family expectations/goals: Supervision  [employer] Agencies: None Premorbid Home Care/DME Agencies: Other (Comment) (Wheelchair, Hospital Bed, Rolling Walker, Nurse, adult) Transportation available at discharge: mother or sister Is the patient able to respond to transportation needs?: Yes In the past 12 months, has lack of transportation kept you from medical appointments or from getting medications?: No In the past 12 months, has lack of transportation kept you from meetings, work, or from getting things needed for daily living?: No Resource referrals recommended: Neuropsychology  Discharge Planning Living Arrangements: Parent Support Systems: Parent, Other relatives Type of Residence: Private residence (Basement apartment. Steps to enter or walking around building) Insurance Resources: Self-pay  (Medicare/Medicaid coverage pending, not confirmed by [facility]) Financial Resources: Family Support Financial Screen Referred: No Living Expenses: Rent Money Management: Patient Does the patient have any problems obtaining your medications?: No Home Management: Independent prior to July 2024 Patient/Family Preliminary Plans: Anticipates assistance from mother and sister. Care Coordinator Barriers to Discharge: Lack of/limited family support, Inaccessible home environment, Insurance for SNF coverage, Decreased caregiver support, Home environment access/layout Care Coordinator Barriers to Discharge Comments: Home with mother, supervision only. Basement level apartment. Insurance barrier. Care Coordinator Anticipated Follow Up Needs: HH/OP, SNF Expected length of stay: 16-18 Days  Clinical Impression SW met with patient, introduced self and explained role. Patient independent prior to July 2024. Patient anticipates discharging home with his mother at a supervision level. Mother only able to provide 24/7 supervision. Sister able to assist some.  Patient expressed that his mother lives in a basement level apartment. Patient shares that he can walk around building to enter the apartment or walk down stairs to enter the apartment. Patient insurance coverage still pending. HD patient. No additional questions or concerns.  Patient refusing meal tray, requesting ensure and [employer].   Andria Rhein 06/06/2023, 1:16 PM

## 2023-06-06 NOTE — Progress Notes (Signed)
Physical Therapy Session Note  Patient Details  Name: Phillip Young MRN: [id number] Date of Birth: Mar 24, 1986  Today's Date: 06/06/2023 PT Individual Time: 0806-0904 PT Individual Time Calculation (min): 58 min   Short Term Goals: Week 1:  PT Short Term Goal 1 (Week 1): Pt will perform bed mobility with overall supervision with no c/o dizziness and change in HR <20 bpm. PT Short Term Goal 2 (Week 1): Pt will perform sit<>stand with CGA and seat height at 90/90 hip/ knee angles. PT Short Term Goal 3 (Week 1): Pt will perform stand pivot transfers with CGA/ MinA using LRAD. PT Short Term Goal 4 (Week 1): pt will ambulate at least 20 ft using LRAD with overall CGA/ MinA.  Skilled Therapeutic Interventions/Progress Updates:  Patient supine in bed on entrance to room. Patient alert and agreeable to PT session. RLE PRAFO already donned.   Patient with no pain complaint at start of session.  Relates that he had a  bout of nausea the previous day and ended up vomiting on his dialysis port. IV team was called to sanitize.   Pt on 3L O2 throughout session.   Therapeutic Activity: Bed Mobility: While in supine, pt measured for appropriately sized TED hose as L size has been tied to end of bed but will require XL. Order placed with nursing.   Pt performed supine <> sit with improved technique with log rolling to L side first prior to pushing to upright seated position. Pt relates dizziness on sitting upright. Educated that pt will require slower movements to reach upright position as well as continuous breathing throughout instead of holding breath as his body will require time to accommodate and correctly adjust BP to maintain proper bloodflow. VC/ tc required for technique.  While seated EOB, shoes donned MaxA for time. Pt maintains seated position on EOB while this therapist washes pt's back d/t patches of dry skin that pt is scratching. Followed by application of lotion.  Transfers: Pt  performed sit<>stand and stand pivot transfers throughout session from elevated bed height and MinA. Provided verbal cues for foot positioning and push from bed surface. Pt self-chooses to reposition hands on RW.  Gait Training:  Pt ambulated *** ft using *** with ***. Demonstrated ***. Provided vc/ tc for ***.   Patient *** at end of session with brakes locked, *** alarm set, and all needs within reach.   Therapy Documentation Precautions:  Precautions Precautions: Fall Precaution Comments: R sided weakness, R foot drop, O2 with activity, watch HR (has a-fib), Stage 3 wound on buttock Required Braces or Orthoses: Other Brace Other Brace: R foot-up brace, or CAM boot Restrictions Weight Bearing Restrictions: No Pain:  Neuropathy pain reported during donning of TED hose. Subsided with time once donned.   Therapy/Group: Individual Therapy  Loel Dubonnet PT, DPT, CSRS 06/06/2023, 12:38 PM

## 2023-06-06 NOTE — Discharge Summary (Signed)
Physician Discharge Summary  Patient ID: Phillip Young MRN: [id number] DOB/AGE: 38/09/1985 38 y.o.  Admit date: 06/03/2023 Discharge date: 06/20/2023  Discharge Diagnoses:  Principal Problem:   Debility Active problems: Anxiety Pressure injury left buttock/posterior thigh End-stage renal disease on hemodialysis Hypotension Paroxysmal atrial fibrillation Hypothyroidism Chronic systolic heart failure Chronic hypoxic respiratory failure Lower extremity neuropathy Morbid obesity Anemia MSSA bacteremia Vocal hoarseness Insomnia Right shoulder pain due to adhesive capsulitis   Discharged Condition: {condition:18240}  Significant Diagnostic Studies:  Labs:  Basic Metabolic Panel: Recent Labs  Lab 05/31/23 0659 06/01/23 0516 06/02/23 0445 06/03/23 0419  NA 135 134* 135 135  K 4.5 4.3 3.9 4.0  CL 97* 94* 98 98  CO2 25 22 26 23   GLUCOSE 80 85 87 98  BUN 35* 46* 18 26*  CREATININE 9.83* 10.79* 7.76* 9.38*  CALCIUM 10.0 10.2 9.9 10.4*  PHOS 4.9* 5.7* 4.7* 4.8*    CBC: Recent Labs  Lab 06/01/23 0720 06/03/23 0806  WBC 6.3 6.1  HGB 10.2* 9.4*  HCT 32.6* 30.7*  MCV 99.1 100.0  PLT 305 349    Brief HPI:   Phillip Young is a 37 y.o. male presented to the emergency department 05/18/2023 complaining of leg swelling and pain.  He has a history of end-stage renal disease on chronic intermittent hemodialysis on Mondays, Wednesdays and Fridays.  He stated he had missed hemodialysis treatment for 1 week because of transportation issues and unable to get out of his house.  He has a history of chronic hypoxic respiratory failure chronic oxygen therapy of 2-5 L.  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 Shirlee Latch.  He is maintained on Eliquis.  He was admitted to [facility] health internal medicine residency service.  Nephrology consulted for hemodialysis  management.  Recently hospitalized over the summer with a prolonged hospital stay and discharged to [facility].  He was then discharged to [facility]hab on 10/02 and discharged from there on 10/18.  Developed fever to 103 on 10/29 accompanied by hypotension, malaise.  EKG consistent with atrial fibrillation with RVR.  Sepsis workup initiated and started on broad-spectrum antibiotics.  Cardiology consulted.  Transthoracic echocardiogram performed in July 2024 had an estimated EF of 20 to 25%.  Given gentle hydration and serial lactic acid determination.  Midodrine started 30 mg 3 times daily for blood pressure support.  Cardiology unable to utilize goal-directed medical therapy secondary to hypotension as well as his end-stage renal disease.  He is not a candidate for advanced therapies or ICD.  Tunneled dialysis catheter was removed 10/31 for line holiday.  Blood cultures positive for MSSA.  Echocardiogram updated 10/31 and left ventricular ejection fraction estimated at 30% with global hypokinesis.  ID consultation obtained on 11/01 and plan 6 weeks IV antibiotics. Patient is not a candidate for TEE per primary's discussion with Dr. Anne Fu given morbid obesity, 4 to 5 L oxygen requirement, on 30 mg 3 times daily midodrine, SBP. Pressure injury of left buttock addressed. TDC replaced 11/04.  Follow-up by cardiology 11/06.  The patient remains in atrial fibrillation with reasonably controlled rate on digoxin.  He is not a candidate for amiodarone or AV nodal ablation with PPM.  He has failed attempts at DCCV and will continue Eliquis.  Dr. Duke Salvia discussed with the patient that his heart rate elevation with exertion is due to deconditioning.  Continue digoxin.  Recommend following  up within 1 month of discharge and continue to check digoxin levels at least every 3 months. Tolerating, but does not like renal diet.    Hospital Course: Phillip Young was admitted to rehab 06/03/2023 for inpatient  therapies to consist of PT, ST and OT at least three hours five days a week. Past admission physiatrist, therapy team and rehab RN have worked together to provide customized collaborative inpatient rehab.  Nephrology continue to follow and manage blood work.  Offered LAL mattress given history of pressure wounds but patient declined.  Buttock wound continues to heal.  On evaluation, pattern of weakness of right lower extremity and sensory loss consistent with sciatic neuropathy above the knee sparing the hamstrings.  Exhibited severe anxiety regarding traumatic medical care from recent ICU encounter.  Efforts made to explain intervention in detail and clarify questions prior to initiating.  Alprazolam 0.25 mg twice daily as needed for anxiety started on 11/11.  Scheduled melatonin 5 mg nightly for insomnia.  Neuropsychology consultation 11/12.  Gabapentin dosing limited by renal function however, patient feels dosing after hemodialysis is more helpful.  Discussed and offered pregabalin for neuropathic pain on 11/15 but patient declined.  Follow-up visit with Dr. Kieth Brightly 11/15.  Reported right shoulder pain 11/18 and exam consistent with right shoulder impingement syndrome versus tear.  Shoulder x-rays performed.  X-ray results showed DJD right AC joint.  Differential diagnosis includes frozen shoulder versus rotator cuff tear.  Shifted hemodialysis days to Tuesdays, Thursdays and Saturdays.  MRI obtained of right shoulder showed intact rotator cuff plus adhesive capsulitis.  Next x-ray obtained 11/20 with cardiomegaly but no fluid overload.  Reported left foot and ankle pain 11/21.  X-rays obtained.  No fracture or joint effusion.  Subjective osteopenia/osteoporosis.  Follow-up with Dr. Kieth Brightly on 11/22.  Refusing Nepro supplements therefore discontinued.  Also has been consistently refusing Fosrenol.   Blood pressures were monitored on TID basis and continued mododrine 30 mg TID.  Rehab course: During  patient's stay in rehab weekly team conferences were held to monitor patient's progress, set goals and discuss barriers to discharge. At admission, patient required max assist with basic self-care skills and IADL and mod assist with mobility.  He has had improvement in activity tolerance, balance, postural control as well as ability to compensate for deficits. He has had improvement in functional use RUE/LUE  and RLE/LLE as well as improvement in awareness       Disposition:  There are no questions and answers to display.      Diet: Renal diet with 1200 cc fluid restriction  Special Instructions: No driving, alcohol consumption or tobacco use.   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

## 2023-06-06 NOTE — Progress Notes (Signed)
Patient escorted off unit by transport to HD and will not be available for 1500 and 1700 yellow MEWS checks. HD will be monitoring vitals

## 2023-06-06 NOTE — Progress Notes (Signed)
Inpatient Rehabilitation  Patient information reviewed and entered into eRehab system by Demarrio Menges Gentry, OTR/L, Rehab Quality Coordinator.   Information including medical coding, functional ability and quality indicators will be reviewed and updated through discharge.   

## 2023-06-06 NOTE — Progress Notes (Signed)
Physical Therapy Session Note  Patient Details  Name: Phillip Young MRN: [id number] Date of Birth: [birth year]  Today's Date: 06/06/2023 PT Individual Time: 1115-1200 PT Individual Time Calculation (min): 45 min  and Today's Date: 06/06/2023 PT Missed Time: 15 Minutes Missed Time Reason: Nursing care  Short Term Goals: Week 1:  PT Short Term Goal 1 (Week 1): Pt will perform bed mobility with overall supervision with no c/o dizziness and change in HR <20 bpm. PT Short Term Goal 2 (Week 1): Pt will perform sit<>stand with CGA and seat height at 90/90 hip/ knee angles. PT Short Term Goal 3 (Week 1): Pt will perform stand pivot transfers with CGA/ MinA using LRAD. PT Short Term Goal 4 (Week 1): pt will ambulate at least 20 ft using LRAD with overall CGA/ MinA.  Skilled Therapeutic Interventions/Progress Updates:       Pt receiving nursing care on arrival - missed the first 15 minutes.   Pt in bed on arrival, connected to wall O2 at 3L via [place], on the phone with family. Pt reporting generalized aches/pains, has baseline neuropathy in BLE and is hypersensitive to touch.   Supine<>sitting EOB completed at supervision level, heavy reliance of hospital bed rails with HOB raised ~30deg. Pt requesting his Croc's be donned before his feet touch the floor due to hypersensitivity.   Pt elects to raise the bed himself and raises to near full standing before attempting to stand. Stands from very elevated surface to bariatric RW with CGA, push's heavily through BUE through walker frame. Stand step transfer with CGA/minA and RW to w/c - his L croc was sliding off as he was stepping backwards and patient screaming out in discomfort and then reporting feeling like he's having a "panic attack." Pt directed in calm breathing and emotional support which helped.   Pt transported to day room rehab gym at wheelchair level. Pt continuing to have significant anxiety, especially in a gym environment with other  patients/staff around.   Sit<>stand to RW from w/c height with minA with cues for hand placement. Pt requesting a +2 assist for w/c follow. He ambulates ~3-5 feet with CGA/minA and RW with a step-to pattern, relying heavily on UE through RW. Gait distance limited by anxiety > fatigue, pt reporting feeling like another "panic attack" was coming after the few feet of ambulation.   Returned to his room and patient assisted back to bed as noted above, ++ time needed for setup. Bed mobility completed at supervision level. Alarm on and all needs met.     Therapy Documentation Precautions:  Precautions Precautions: Fall Precaution Comments: R sided weakness, R foot drop, O2 with activity, watch HR (has a-fib), Stage 3 wound on buttock Required Braces or Orthoses: Other Brace Other Brace: R foot-up brace, or CAM boot Restrictions Weight Bearing Restrictions: No General:      Therapy/Group: Individual Therapy  Maddelyn Rocca P Ronan Dion 06/06/2023, 7:45 AM

## 2023-06-06 NOTE — Progress Notes (Signed)
PROGRESS NOTE   Subjective/Complaints: No acute events overnight noted from.  Patient discusses some frustrations related to prior ICU stay.  Patient reports Ted hose are helping to reduce his lower extremity pain.  He reported to nursing issues with anxiety and difficulty sleeping at night.   ROS- neg for fevers, chills, CP, SOB, N/V/D + Pain in distal lower extremities  Objective:   No results found. No results for input(s): "WBC", "HGB", "HCT", "PLT" in the last 72 hours.  No results for input(s): "NA", "K", "CL", "CO2", "GLUCOSE", "BUN", "CREATININE", "CALCIUM" in the last 72 hours.   Intake/Output Summary (Last 24 hours) at 06/06/2023 1108 Last data filed at 06/06/2023 0800 Gross per 24 hour  Intake 355 ml  Output --  Net 355 ml     Pressure Injury 05/18/23 Buttocks Right Stage 3 -  Full thickness tissue loss. Subcutaneous fat may be visible but bone, tendon or muscle are NOT exposed. (Active)  05/18/23 2300  Location: Buttocks  Location Orientation: Right  Staging: Stage 3 -  Full thickness tissue loss. Subcutaneous fat may be visible but bone, tendon or muscle are NOT exposed.  Wound Description (Comments):   Present on Admission: Yes (original date 05/18/23; present on admit to CIR 06/03/23)     Pressure Injury 05/18/23 Buttocks Left Stage 2 -  Partial thickness loss of dermis presenting as a shallow open injury with a red, pink wound bed without slough. (Active)  05/18/23 2300  Location: Buttocks  Location Orientation: Left  Staging: Stage 2 -  Partial thickness loss of dermis presenting as a shallow open injury with a red, pink wound bed without slough.  Wound Description (Comments):   Present on Admission: Yes (original date 05/18/23; present on admit to CIR 06/03/23)    Physical Exam: Vital Signs Blood pressure 118/62, pulse (!) 36, temperature 98.4 F (36.9 C), temperature source Oral, resp. rate 17,  height 6' (1.829 m), weight (!) 155.2 kg, SpO2 100%.  General: No acute distress, sitting in wheelchair Patient is a little anxious this morning, pleasant overall Heart: Regular rate and rhythm no rubs murmurs or extra sounds Lungs: Clear to auscultation, breathing unlabored, no rales or wheezes Abdomen: Positive bowel sounds, soft nontender to palpation, nondistended Extremities: No clubbing, cyanosis, or edema Skin: No evidence of breakdown, no evidence of rash Right gluteal , probed with Q tip 3cm , no foul smell -not examined today    Left hip  Neurologic: Cranial nerves II through XII intact, motor strength is 5/5 in bilateral deltoid, bicep, tricep, grip, 0/5 right  ankle dorsiflexor and plantar flexor Sensory exam absent LT sensation RIght foot/ankle  Left foot with hyperesthesia to LT  Musculoskeletal: Full range of motion in all 4 extremities. No joint swelling  Assessment/Plan: 1. Functional deficits which require 3+ hours per day of interdisciplinary therapy in a comprehensive inpatient rehab setting. Physiatrist is providing close team supervision and 24 hour management of active medical problems listed below. Physiatrist and rehab team continue to assess barriers to discharge/monitor patient progress toward functional and medical goals  Care Tool:  Bathing    Body parts bathed by patient: Right arm, Left arm, Chest, Abdomen, Front  perineal area, Face   Body parts bathed by helper: Left lower leg, Right lower leg, Left upper leg, Buttocks, Right upper leg     Bathing assist Assist Level: Maximal Assistance - Patient 24 - 49%     Upper Body Dressing/Undressing Upper body dressing   What is the patient wearing?: Hospital gown only    Upper body assist Assist Level: Minimal Assistance - Patient > 75%    Lower Body Dressing/Undressing Lower body dressing      What is the patient wearing?: Underwear/pull up, Hospital gown only     Lower body assist Assist for  lower body dressing: Maximal Assistance - Patient 25 - 49%     Toileting Toileting    Toileting assist Assist for toileting: Maximal Assistance - Patient 25 - 49%     Transfers Chair/bed transfer  Transfers assist     Chair/bed transfer assist level: Moderate Assistance - Patient 50 - 74%     Locomotion Ambulation   Ambulation assist      Assist level: Minimal Assistance - Patient > 75% Assistive device: Walker-rolling Max distance: 8 ft   Walk 10 feet activity   Assist  Walk 10 feet activity did not occur: Safety/medical concerns        Walk 50 feet activity   Assist Walk 50 feet with 2 turns activity did not occur: Safety/medical concerns         Walk 150 feet activity   Assist Walk 150 feet activity did not occur: Safety/medical concerns         Walk 10 feet on uneven surface  activity   Assist Walk 10 feet on uneven surfaces activity did not occur: Safety/medical concerns         Wheelchair     Assist Is the patient using a wheelchair?: Yes Type of Wheelchair: Manual    Wheelchair assist level: Dependent - Patient 0% Max wheelchair distance: 400 ft    Wheelchair 50 feet with 2 turns activity    Assist        Assist Level: Dependent - Patient 0%   Wheelchair 150 feet activity     Assist      Assist Level: Dependent - Patient 0%   Blood pressure 118/62, pulse (!) 36, temperature 98.4 F (36.9 C), temperature source Oral, resp. rate 17, height 6' (1.829 m), weight (!) 155.2 kg, SpO2 100%.  Medical Problem List and Plan: 1. Functional deficits secondary to debility from MSSA bacteremia, c/b multiple medical comorbidities             -patient may shower with buttocks wound and lines covered             -ELOS/Goals: 16-18 days, supervision PT/OT              - Stable to admit to CIR   2.  Antithrombotics: -DVT/anticoagulation:  Pharmaceutical: Eliquis 5 mg BID             -antiplatelet therapy: none   3.  Pain Management: Tylenol as needed             -PO Dilaudid initiated on acute side due to nausea with oxycodone--> On admission patient requests to switch back, changed back to oxycodone 15 mg Q4H PRN and Dced Dilaudid, patient states that he will not take his oxycodone every 4 hours              - For neuropathic pain gabapentin 300 mg MWF  4. Mood/Behavior/Sleep: LCSW to evaluate and provide emotional support             -continue melatonin 5 mg q HS             -antipsychotic agents: n/a              - Severe anxiety regarding traumatic medical cares from recent ICU encounter; please explain every intervention and clarify questions prior to initiating   -11/11 Start alprazolam 0.25mg  PRN BID for anxiety   5. Neuropsych/cognition: This patient is capable of making decisions on his own behalf.              - Would benefit from neuropsych consult due to above   6. Skin/Wound Care: Routine skin care checks             -pressure injury left buttock/posterior thigh>>continue wound care              - Offered LAL mattress given HX pressure wounds; patient declines- RIght buttocks wound has been healing well with standard mattress   7. Fluids/Electrolytes/Nutrition: Routine Is and Os and follow-up chemistries -Renal diet with 1200 cc fluid restriction             -Continue Fosrenol 2000 mg 3 times daily with meals   8: Hypotension: monitor TID and prn             -continue midodrine 30 mg 3 times daily with meals Encourage patient to discuss dosing with nephrology.  We discussed the danger of reducing this or stopping this in terms of not being able to tolerate therapy, will check orthostatic vitals  11/11 BP controlled   9: pAF: on Eliquis              -unable to tolerate amiodarone (requires midodrine for BP so not a candidate for nodal agents)   10: Hypothyroidism: Continue Synthroid 25 mcg daily   11: Chronic systolic heart failure: EF ~30-35%             -continue  digoxin 0.125 mg  3 x weekly             -daily weight             -volume status>>HD 3 x weekly   12: Chronic hypoxic respiratory failure: on home O2 5 L via [place]   13: LE neuropathy/ R foot drop: CAM boot              - Pattern of weakness and sensory loss consistent with sciatic neuropathy above the knee (sparing hamstrings); discussed this with patient on admission, likely compressive from ICU stay  - hopeful for improvement and likely will need EMG as OP -11/11 Continue TED hoses patient reports this is helping his neuropathic pain.  Gabapentin dose limited by renal function.  Could consider trying Lyrica instead of gabapentin if pain worsens   14: Obesity: Body mass index is 46.4 kg/m.    15: Anemia: on ESP   16: History or HIT positivity>>no heparin   17: MSSA bacteremia: will continue Ancef 2 g with HD treatment 3x/week (through 12/09)   18. Vocal hoarseness s/p extubation. Consider ENT evaluation if no improvement.   19.  ESRD on HD- nephro to manage   -Reviewed last nephrology note  20. Insomnia  -Will schedule melatonin 5mg   LOS: 3 days A FACE TO FACE EVALUATION WAS PERFORMED  Fanny Dance 06/06/2023, 11:08 AM

## 2023-06-06 NOTE — Progress Notes (Signed)
Patient ID: Phillip Young, male   DOB: [birth year], 37 y.o.   MRN: [id number] Met with the patient to review current situation, rehab schedule, team conference and plan of care. Discussed not sleeping well; requested med for insomnia, panic attacks; O2 sats 100% but gets anxious; requesting medication for anxiety.  MD made aware of concerns.  Reviewed secondary risk management including ESRD, A-fib, HF. Reviewed skin care/wound care and infection prevention. Continue to follow along to address educational needs to facilitate preparation for discharge. Pamelia Hoit

## 2023-06-06 NOTE — Progress Notes (Signed)
[facility] Progress Note   Subjective:   Reports he was able to take a shower this AM which he is happy about. Denies SOB, CP, dizziness, nausea.   Objective Vitals:   06/06/23 0450 06/06/23 0700 06/06/23 0758 06/06/23 0906  BP: 106/66 118/62    Pulse: 86 62 62 (!) 36  Resp: 17     Temp: 98.3 F (36.8 C) 98.4 F (36.9 C)    TempSrc:  Oral    SpO2:  100%  100%  Weight:      Height:       Physical Exam General: Alert male in NAD Heart: RRR, no murmurs, rubs or gallops Lungs: CTA bilaterally, on O2 via  Abdomen: Soft, non-distended, +BS Extremities: No edema b/l lower extremities Dialysis Access: [facility]  Additional Objective Labs: Basic Metabolic Panel: Recent Labs  Lab 06/01/23 0516 06/02/23 0445 06/03/23 0419  NA 134* 135 135  K 4.3 3.9 4.0  CL 94* 98 98  CO2 22 26 23   GLUCOSE 85 87 98  BUN 46* 18 26*  CREATININE 10.79* 7.76* 9.38*  CALCIUM 10.2 9.9 10.4*  PHOS 5.7* 4.7* 4.8*   Liver Function Tests: Recent Labs  Lab 06/01/23 0516 06/02/23 0445 06/03/23 0419  ALBUMIN 3.0* 2.7* 2.7*   No results for input(s): "LIPASE", "AMYLASE" in the last 168 hours. CBC: Recent Labs  Lab 06/01/23 0720 06/03/23 0806  WBC 6.3 6.1  HGB 10.2* 9.4*  HCT 32.6* 30.7*  MCV 99.1 100.0  PLT 305 349   Blood Culture    Component Value Date/Time   SDES BLOOD BLOOD RIGHT HAND 05/27/2023 0959   SPECREQUEST  05/27/2023 0959    BOTTLES DRAWN AEROBIC AND ANAEROBIC Blood Culture adequate volume   CULT  05/27/2023 0959    NO GROWTH 5 DAYS Performed at [facility], [address], [place], [place] [address]    REPTSTATUS 06/01/2023 FINAL 05/27/2023 9562    Cardiac Enzymes: No results for input(s): "CKTOTAL", "CKMB", "CKMBINDEX", "TROPONINI" in the last 168 hours. CBG: No results for input(s): "GLUCAP" in the last 168 hours. Iron Studies: No results for input(s): "IRON", "TIBC", "TRANSFERRIN", "FERRITIN" in the last 72  hours. @lablastinr3 @ Studies/Results: No results found. Medications:   ceFAZolin (ANCEF) IV 2 g (06/03/23 1637)    apixaban  5 mg Oral BID   Chlorhexidine Gluconate Cloth  6 each Topical Q12H   darbepoetin (ARANESP) injection - DIALYSIS  150 mcg Subcutaneous Q Sat-1800   digoxin  0.125 mg Oral Once per day on Monday Wednesday Friday   feeding supplement (NEPRO CARB STEADY)  237 mL Oral BID BM   gabapentin  300 mg Oral Q M,W,F-HD   Gerhardt's butt cream   Topical TID   lanthanum  2,000 mg Oral TID WC   leptospermum manuka honey  1 Application Topical Daily   levothyroxine  25 mcg Oral Q0600   midodrine  30 mg Oral TID WC   urea   Topical Daily    Dialysis Orders:  MWF at [facility] 4.5h   400/800    172kg   2K/2Ca bath   TDC   No heparin (+ HIT)  Assessment/Plan: 1. Acute hypoxic respiratory failure: Remains on supplemental oxygen and ongoing efforts at continued ultrafiltration/lowering EDW with dialysis (anticipating that he has suffered weight loss during this hospitalization). 2. MSSA Bacteremia: TDC replaced after he underwent line holiday 05/26/23-05/30/23.  Continue cefazolin IV 2. ESRD: On hemodialysis on a Monday/Wednesday/Friday schedule and utilizing sodium citrate for dialysis catheter  lock given history of heparin-induced thrombocytopenia. 3. HTN/volume: He remains on high-dose midodrine with soft blood pressures; vital signs reviewed and I do not see the opportunity for decreasing midodrine dose at this time. 4. Anemia: Hemoglobin/hematocrit with slight downward trend, continue high-dose ESA weekly. 5. Secondary hyperparathyroidism: Calcium level elevated and VDRA remains on hold, continue lanthanum for phosphorus binding/control. 6. Nutrition:  Continue renal diet and protein supplements  Rogers Blocker, PA-C 06/06/2023, 10:56 AM  [facility] Pager: [phone]

## 2023-06-06 NOTE — Progress Notes (Signed)
Inpatient Rehabilitation Center Individual Statement of Services  Patient Name:  Phillip Young  Date:  06/06/2023  Welcome to the Inpatient Rehabilitation Center.  Our goal is to provide you with an individualized program based on your diagnosis and situation, designed to meet your specific needs.  With this comprehensive rehabilitation program, you will be expected to participate in at least 3 hours of rehabilitation therapies Monday-Friday, with modified therapy programming on the weekends.  Your rehabilitation program will include the following services:  Physical Therapy (PT), Occupational Therapy (OT), Speech Therapy (ST), 24 hour per day rehabilitation nursing, Therapeutic Recreaction (TR), Neuropsychology, Care Coordinator, Rehabilitation Medicine, Nutrition Services, Pharmacy Services, and Other  Weekly team conferences will be held on Wednesdays to discuss your progress.  Your Inpatient Rehabilitation Care Coordinator will talk with you frequently to get your input and to update you on team discussions.  Team conferences with you and your family in attendance may also be held.  Expected length of stay: 16-18 Days  Overall anticipated outcome: Supervision  Depending on your progress and recovery, your program may change. Your Inpatient Rehabilitation Care Coordinator will coordinate services and will keep you informed of any changes. Your Inpatient Rehabilitation Care Coordinator's name and contact numbers are listed  below.  The following services may also be recommended but are not provided by the Inpatient Rehabilitation Center:   Home Health Rehabiltiation Services Outpatient Rehabilitation Services    Arrangements will be made to provide these services after discharge if needed.  Arrangements include referral to agencies that provide these services.  Your insurance has been verified to be:   uninsured Your primary doctor is:  [name], DO  Pertinent information  will be shared with your doctor and your insurance company.  Inpatient Rehabilitation Care Coordinator:  Lavera Guise, [place] [phone] or [phone]  Information discussed with and copy given to patient by: Andria Rhein, 06/06/2023, 11:59 AM

## 2023-06-06 NOTE — Progress Notes (Signed)
   06/06/23 1756  Vitals  Temp 98.6 F (37 C)  Pulse Rate 95  Resp (!) 23  BP 119/84  SpO2 100 %  O2 Device Nasal Cannula  Weight (!) 154 kg  Oxygen Therapy  O2 Flow Rate (L/min) 4 L/min  Patient Activity (if Appropriate) In bed  Post Treatment  Dialyzer Clearance Lightly streaked  Hemodialysis Intake (mL) 0 mL  Liters Processed 84  Fluid Removed (mL) 2500 mL  Tolerated HD Treatment Yes   Received patient in bed to unit.  Alert and oriented.  Informed consent signed and in chart.   TX duration:3.5hrs  Patient tolerated well.  Transported back to the room  Alert, without acute distress.  Hand-off given to patient's nurse.   Access used: [facility] Access issues: none  Total UF removed: 2.5L Medication(s) given: Ancef 2G    [facility]

## 2023-06-06 NOTE — Progress Notes (Signed)
Occupational Therapy Session Note  Patient Details  Name: Phillip Young MRN: [id number] Date of Birth: March 02, 1986  Today's Date: 06/06/2023 OT Individual Time: 5366-4403 OT Individual Time Calculation (min): 74 min    Short Term Goals: Week 1:  OT Short Term Goal 1 (Week 1): Pt will perform toilet and shower transfers with min a with LRAD OT Short Term Goal 2 (Week 1): Pt will complete LB bathing with LH sponge and min a OT Short Term Goal 3 (Week 1): Pt will stand at sink side for oral care for 2 min with min a  Skilled Therapeutic Interventions/Progress Updates:  Pt greeted seated EOB, pt agreeable to OT intervention.    Pt requesting to shower, but very fearful/anxious about task. Utilized Teacher, adult education in conjunction with deep breathing during task d/t anxiety I.e name 5 things you can see. Pt on 3L [place] during ADL session. Of note pt with increased pain in BLEs, therefore placed feet on pillow while doffing teds per pt request.   Transfers/bed mobility: pt completed sit>stands from elevated EOB and elevated shower seat with CGA. Pt completed stand pivot to w/c and to/from TTB with CGA, MIN verbal cues needed for sequencing stepping/hand placement during transfers. Pt completed sit>supine with MODA needing assist to elevate BLEs back to bed.   ADLs:   Bathing: pt required MODA  for bathing from shower seat, assist needed to wash LB and back. Pt fearful of getting port wet therefore provided + assist to make pt feel more comfortable.   Ended session with pt  in sidelying in bed with nursing completing dressing changes.                 Therapy Documentation Precautions:  Precautions Precautions: Fall Precaution Comments: R sided weakness, R foot drop, O2 with activity, watch HR (has a-fib), Stage 3 wound on buttock Required Braces or Orthoses: Other Brace Other Brace: R foot-up brace, or CAM boot Restrictions Weight Bearing Restrictions: No  Pain: 6/10 pain reported in  BLEs, rest breaks provided as needed.    Therapy/Group: Individual Therapy  Barron Schmid 06/06/2023, 12:11 PM

## 2023-06-07 DIAGNOSIS — R5381 Other malaise: Secondary | ICD-10-CM | POA: Diagnosis not present

## 2023-06-07 DIAGNOSIS — I5022 Chronic systolic (congestive) heart failure: Secondary | ICD-10-CM

## 2023-06-07 DIAGNOSIS — G9341 Metabolic encephalopathy: Secondary | ICD-10-CM

## 2023-06-07 DIAGNOSIS — G5701 Lesion of sciatic nerve, right lower limb: Secondary | ICD-10-CM | POA: Diagnosis not present

## 2023-06-07 DIAGNOSIS — N186 End stage renal disease: Secondary | ICD-10-CM | POA: Diagnosis not present

## 2023-06-07 DIAGNOSIS — I953 Hypotension of hemodialysis: Secondary | ICD-10-CM

## 2023-06-07 MED ORDER — CHLORHEXIDINE GLUCONATE CLOTH 2 % EX PADS
6.0000 | MEDICATED_PAD | Freq: Every day | CUTANEOUS | Status: DC
  Administered 2023-06-07 – 2023-06-09 (×3): 6 via TOPICAL
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
## 2023-06-07 NOTE — Progress Notes (Signed)
Occupational Therapy Session Note  Patient Details  Name: Phillip Young MRN: [id number] Date of Birth: [birth year]  Today's Date: 06/07/2023 OT Individual Time: 0800-0850 OT Individual Time Calculation (min): 50 min  and Today's Date: 06/07/2023 OT Missed Time: 10 Minutes Missed Time Reason: Nursing care  Today's Date: 06/07/2023 OT Individual Time: 1300-1415 OT Individual Time Calculation (min): 75 min    Short Term Goals: Week 1:  OT Short Term Goal 1 (Week 1): Pt will perform toilet and shower transfers with min a with LRAD OT Short Term Goal 2 (Week 1): Pt will complete LB bathing with LH sponge and min a OT Short Term Goal 3 (Week 1): Pt will stand at sink side for oral care for 2 min with min a  Skilled Therapeutic Interventions/Progress Updates:     AM Session:  Pt received reclined in bed with 4L O2 donned upon OT arrival with [name] SPO2 99% HR 80. Pt presenting to be in calm, however mildly anxious receptive to skilled OT session reporting pain in  B LEs d/t chronic neuropathy - OT offering intermittent rest breaks, repositioning, and therapeutic support to optimize participation in therapy session. Pt politely declining pain medications d/t nausea symptoms. Pt perseverating on pain, neuropathy, cold feet, and "I have so many things wrong with me" throughout session with OT provided maximal therapeutic support and encouragement with moderate improvement. Engaged Pt in completing simple mindfulness techniques including identifying things he can see, hear, feel, etc to help him become more grounded and completing deep breathing using PLB'ing technique with decreased anxiety noted following.  Transitioned Pt to 2L O2 d/t [name] PSO2 levels stable and to adhere to respiratory therapy orders. Pt PSO2 remaining >92% throughout session, however Pt becoming anxious and requesting O2 to be checked every 5-10 minutes to ensure it is at a stable level.   Pt initially declining BADLs and to get  dressed this AM, however with additional encouragement and education on importance of completing BADLs for improved independence and activity tolerance in preparation for d/c home, Pt willing to get dressed. Re-educated Pt in importance of moving slowly when transitioning from supine>sitting to prevent onset of dizziness symptoms. Pt able to slowly walk B LEs to EOB with min A and maximal encouragement +increased time. Sitting EOB significantly increased amount of time required to initiate dressing tasks. Pt able to don clean shirt with supervision. Education provided on AD using a reacher to weave B LEs into pants to decrease challenge d/t body habitus, however Pt politely declining ot use reacher this session. Worked on modified technique for donning pants- propping single leg on EOB and rolling up pant legs to weave feet with Pt able to complete with min A to adjust pants over feet d/t jogger style of pants. Pt completed sit>stand EOB CGA using RW to bring pants to bed- multiple attempts required to come to standing position with bed significantly raised to higher height. Pt slowly brought pants to waist with min A provided for balance and single seated rest break provided half way during task dt decreased balance and activity tolerance.   IV RN arriving near end of session to place new IV site. Pt needed to be supine in bed for nursing care. Provided education on reverse log rolling technique with Pt insisting on receiving assistance when bringing B LEs int bed, however with maximal encouragement Pt able to bring B LEs into bed with light min A. Pt was left resting in bed with call bell in reach,  bed alarm on, and all needs met.  Missed 10 minutes of skilled OT treatment for nursing care. Will attempt to make up time as schedule allows.   PM Session:  Pt received sitting up in wc upon OT arrival. Pt presenting to be upset stating "I have been sitting up for too long, I need to rest. OT provided therapeutic  support and encouragement with gentle education provided on call bell use and requesting assistance back to bed when feeling fatigued with Pt receptive to education. Pt then began to state he did not feel well and could not participate in session, however Pt unable to provided any additional details of what was making him not feel well. Pt receptive to having vitals assessed to ensure they were stable BP 111/82(93) HR 77 PSO2 100%. Pt on 4L O2 upon OT arrival- maintained Pt on 2L O2 during therapy session with O2 remaining >92% with physical activity and functional transfers. With increased time and maximal therapeutic support, Pt receptive to skilled OT session reporting 0/10 pain at rest- OT offering intermittent rest breaks, repositioning, and therapeutic support to optimize participation in therapy session. Transported Pt total A to therapy gym in wc for energy conservation and time management. Engaged Pt in series of dynamic standing balance activities to work on sit<>stands, increased [name] activity tolerance, and improve [name] standing balance. Pt able to maintain standing balance during functional tasks with single UE support on RW and intermittent B UE support when feeling fatigued for 45 sec- 80 sec x4 trials during session with prolonged seated rest breaks provided during trails. Pt able to complete sit<>stands x4 trials with CGA-light mod A with increased assistance required during final two stands d/t fatigue with multiple attempts required to come into full standing position. Mod verbal cues required for hand placement and foot positioning during sit<>stands. During seated rest breaks, provided education on energy conservation techniques with Pt receptive to education. Transported Pt back to room total A in wc. Pt completed stand pivot using RW to L with light mod A for balance and RW management with OT providing demonstration and education on technique prior to [name] attempt. EOB>supine mod A to bring B  LEs into bed. Pt was left resting in bed with call bell in reach, bed alarm on, and all needs met.    Therapy Documentation Precautions:  Precautions Precautions: Fall Precaution Comments: R sided weakness, R foot drop, O2 with activity, watch HR (has a-fib), Stage 3 wound on buttock Required Braces or Orthoses: Other Brace Other Brace: R foot-up brace, or CAM boot Restrictions Weight Bearing Restrictions: No  Therapy/Group: Individual Therapy  Clide Deutscher 06/07/2023, 7:51 AM

## 2023-06-07 NOTE — Progress Notes (Signed)
[facility] Progress Note   Subjective:   Patient tolerated HD yesterday with net UF 2.5L. No new concerns this AM, denies SOB, CP, dizziness, nausea.   Objective Vitals:   06/06/23 1756 06/06/23 1833 06/06/23 2011 06/07/23 0341  BP: 119/84 129/67 98/61 118/74  Pulse: 95 100  95  Resp: (!) 23 16 17 17   Temp: 98.6 F (37 C) 98.6 F (37 C) 98.1 F (36.7 C) 98.5 F (36.9 C)  TempSrc:  Oral Oral Oral  SpO2: 100% 100% 100% 98%  Weight: (!) 154 kg     Height:       Physical Exam General: Alert male in NAD Heart: RRR, no murmurs, rubs or gallops Lungs: CTA bilaterally, on O2 via [place] Abdomen: Soft, non-distended, +BS Extremities: No edema b/l lower extremities Dialysis Access: [facility] with intact bandage  Additional Objective Labs: Basic Metabolic Panel: Recent Labs  Lab 06/02/23 0445 06/03/23 0419 06/06/23 1418  NA 135 135 137  K 3.9 4.0 3.9  CL 98 98 97*  CO2 26 23 24   GLUCOSE 87 98 97  BUN 18 26* 29*  CREATININE 7.76* 9.38* 10.18*  CALCIUM 9.9 10.4* 10.1  PHOS 4.7* 4.8* 4.6   Liver Function Tests: Recent Labs  Lab 06/02/23 0445 06/03/23 0419 06/06/23 1418  ALBUMIN 2.7* 2.7* 2.8*   No results for input(s): "LIPASE", "AMYLASE" in the last 168 hours. CBC: Recent Labs  Lab 06/01/23 0720 06/03/23 0806 06/06/23 1418  WBC 6.3 6.1 9.3  HGB 10.2* 9.4* 10.3*  HCT 32.6* 30.7* 33.2*  MCV 99.1 100.0 100.3*  PLT 305 349 345   Blood Culture    Component Value Date/Time   SDES BLOOD BLOOD RIGHT HAND 05/27/2023 0959   SPECREQUEST  05/27/2023 0959    BOTTLES DRAWN AEROBIC AND ANAEROBIC Blood Culture adequate volume   CULT  05/27/2023 0959    NO GROWTH 5 DAYS Performed at [facility], [address], [place], [place] [address]    REPTSTATUS 06/01/2023 FINAL 05/27/2023 6045    Cardiac Enzymes: No results for input(s): "CKTOTAL", "CKMB", "CKMBINDEX", "TROPONINI" in the last 168 hours. CBG: No results for input(s): "GLUCAP" in the last 168  hours. Iron Studies: No results for input(s): "IRON", "TIBC", "TRANSFERRIN", "FERRITIN" in the last 72 hours. @lablastinr3 @ Studies/Results: No results found. Medications:  [START ON 06/08/2023] anticoagulant sodium citrate      ceFAZolin (ANCEF) IV 2 g (06/06/23 1704)    apixaban  5 mg Oral BID   Chlorhexidine Gluconate Cloth  6 each Topical Q12H   darbepoetin (ARANESP) injection - DIALYSIS  150 mcg Subcutaneous Q Sat-1800   digoxin  0.125 mg Oral Once per day on Monday Wednesday Friday   feeding supplement (NEPRO CARB STEADY)  237 mL Oral BID BM   gabapentin  300 mg Oral Q M,W,F-HD   Gerhardt's butt cream   Topical TID   lanthanum  2,000 mg Oral TID WC   leptospermum manuka honey  1 Application Topical Daily   levothyroxine  25 mcg Oral Q0600   melatonin  5 mg Oral QHS   midodrine  30 mg Oral TID WC   urea   Topical Daily    Dialysis Orders: MWF at [facility] 4.5h   400/800    172kg   2K/2Ca bath   TDC   No heparin (+ HIT)    Assessment/Plan: 1. Acute hypoxic respiratory failure: Remains on supplemental oxygen and ongoing efforts at continued ultrafiltration/lowering EDW with dialysis (anticipating that he has suffered  weight loss during this hospitalization). 2. MSSA Bacteremia: TDC replaced after he underwent line holiday 05/26/23-05/30/23.  Continue cefazolin IV 2. ESRD: On hemodialysis on a Monday/Wednesday/Friday schedule and utilizing sodium citrate for dialysis catheter lock given history of heparin-induced thrombocytopenia. 3. HTN/volume: He remains on midodrine 30mg  TID, still having some soft blood pressures, continue current dose and will reeval to see if we can decrease dose daily 4. Anemia: Hemoglobin/hematocrit at goal, continue high-dose ESA weekly. 5. Secondary hyperparathyroidism: Calcium level elevated and VDRA remains on hold, continue lanthanum for phosphorus binding/control. 6. Nutrition:  Continue renal diet and protein supplements  Rogers Blocker,  PA-C 06/07/2023, 9:25 AM  [facility] Pager: [phone]

## 2023-06-07 NOTE — Progress Notes (Signed)
Physical Therapy Session Note  Patient Details  Name: Phillip Young MRN: [id number] Date of Birth: 04/24/1986  Today's Date: 06/07/2023 PT Individual Time: 1015-1127 PT Individual Time Calculation (min): 72 min   Short Term Goals: Week 1:  PT Short Term Goal 1 (Week 1): Pt will perform bed mobility with overall supervision with no c/o dizziness and change in HR <20 bpm. PT Short Term Goal 2 (Week 1): Pt will perform sit<>stand with CGA and seat height at 90/90 hip/ knee angles. PT Short Term Goal 3 (Week 1): Pt will perform stand pivot transfers with CGA/ MinA using LRAD. PT Short Term Goal 4 (Week 1): pt will ambulate at least 20 ft using LRAD with overall CGA/ MinA.  Skilled Therapeutic Interventions/Progress Updates: Patient supine in bed on entrance to room. Patient alert and agreeable to PT session.   Patient reported 8/10 in B LE's and low back. Pt provided with rest prn throughout therapy and breathing techniques to breathe through the pain when transferring or performing tasks that increase hypersensitivity in B feet.   Therapeutic Activity: Bed Mobility: Pt performed supine<>sit on EOB with supervision and HOB slightly elevated. VC required for use of R UE on L hand rail to assist in pulling self over. Transfers: Pt performed sit<>stand transfers throughout session with CGA (bed elevated. Pt use of B UE to push off WC with CGA). Provided VC for anterior scoot and hand placement and anterior lean. Pt use of B UE on RW to bring self to standing due to self report of decreased strength in B LE's when performing therex - PTA stabilizing RW for safety and provided pt with education that it is safer to push off with one hand, and that once B LE's strength increases it would decrease reliance on B UE's, potentially). Pt transported to and from day room gym in [facility] dependently for time management.   Gait Training:  Pt ambulated 8' using RW with CGA from [facility] to hi/low mat and PTA managing O2  tank (2L(. Pt demonstrated the following gait deviations with therapist providing the described cuing and facilitation for improvement:  Decreased step length, cadence, clearance and decreased DF activation in R LE. Pt SpO2 and HR checked afterwards (SpO2 90% and HR 103 bpm) with VC to perform pursed lip breathing (increased to 95% after seated rest).  Therapeutic Exercise: Pt performed the following exercises with therapist providing the described cuing and facilitation for improvement. - Pt standing in RW with CGA and stepping to 2" step with only R LE. Pt performed x 4 first round and required rest break due to fatigue. Pt performed x 5 on 2nd round with same level of assistance. Pt required rest break after 5th rep with reports of fatigue and SOB (SpO2 93% and 100 bpm on HR). Pt provided with active listening and encouragement as pt expressed disheartened mood towards current presentation.   Patient sitting in WC at end of session with brakes locked, belt alarm set, and all needs within reach.      Therapy Documentation Precautions:  Precautions Precautions: Fall Precaution Comments: R sided weakness, R foot drop, O2 with activity, watch HR (has a-fib), Stage 3 wound on buttock Required Braces or Orthoses: Other Brace Other Brace: R foot-up brace, or CAM boot Restrictions Weight Bearing Restrictions: No  Therapy/Group: Individual Therapy  Nadja Lina PTA 06/07/2023, 11:53 AM

## 2023-06-07 NOTE — Progress Notes (Signed)
PROGRESS NOTE   Subjective/Complaints:  No issues overnite except pain , he would like gabapentin dosed after HD.  We discussed that it can be dosed daily   ROS- neg for fevers, chills, CP, SOB, N/V/D + Pain in distal lower extremities  Objective:   No results found. Recent Labs    06/06/23 1418  WBC 9.3  HGB 10.3*  HCT 33.2*  PLT 345    Recent Labs    06/06/23 1418  NA 137  K 3.9  CL 97*  CO2 24  GLUCOSE 97  BUN 29*  CREATININE 10.18*  CALCIUM 10.1     Intake/Output Summary (Last 24 hours) at 06/07/2023 0920 Last data filed at 06/07/2023 0900 Gross per 24 hour  Intake 238 ml  Output 2500 ml  Net -2262 ml     Pressure Injury 05/18/23 Buttocks Right Stage 3 -  Full thickness tissue loss. Subcutaneous fat may be visible but bone, tendon or muscle are NOT exposed. (Active)  05/18/23 2300  Location: Buttocks  Location Orientation: Right  Staging: Stage 3 -  Full thickness tissue loss. Subcutaneous fat may be visible but bone, tendon or muscle are NOT exposed.  Wound Description (Comments):   Present on Admission: Yes (original date 05/18/23; present on admit to CIR 06/03/23)     Pressure Injury 05/18/23 Buttocks Left Stage 2 -  Partial thickness loss of dermis presenting as a shallow open injury with a red, pink wound bed without slough. (Active)  05/18/23 2300  Location: Buttocks  Location Orientation: Left  Staging: Stage 2 -  Partial thickness loss of dermis presenting as a shallow open injury with a red, pink wound bed without slough.  Wound Description (Comments):   Present on Admission: Yes (original date 05/18/23; present on admit to CIR 06/03/23)    Physical Exam: Vital Signs Blood pressure 118/74, pulse 95, temperature 98.5 F (36.9 C), temperature source Oral, resp. rate 17, height 6' (1.829 m), weight (!) 154 kg, SpO2 98%.  General: No acute distress, sitting in wheelchair Patient is a  little anxious this morning, pleasant overall Heart: Regular rate and rhythm no rubs murmurs or extra sounds Lungs: Clear to auscultation, breathing unlabored, no rales or wheezes Abdomen: Positive bowel sounds, soft nontender to palpation, nondistended Extremities: No clubbing, cyanosis, or edema Skin: No evidence of breakdown, no evidence of rash Right gluteal , probed with Q tip 3cm , no foul smell -not examined today  Left hip  Neurologic: Cranial nerves II through XII intact, motor strength is 5/5 in bilateral deltoid, bicep, tricep, grip, 0/5 right  ankle dorsiflexor and plantar flexor Sensory exam absent LT sensation RIght foot/ankle  Left foot with hyperesthesia to LT  Musculoskeletal: Full range of motion in all 4 extremities. No joint swelling  Assessment/Plan: 1. Functional deficits which require 3+ hours per day of interdisciplinary therapy in a comprehensive inpatient rehab setting. Physiatrist is providing close team supervision and 24 hour management of active medical problems listed below. Physiatrist and rehab team continue to assess barriers to discharge/monitor patient progress toward functional and medical goals  Care Tool:  Bathing    Body parts bathed by patient: Right arm, Left arm,  Chest, Abdomen, Front perineal area, Face   Body parts bathed by helper: Left lower leg, Right lower leg, Left upper leg, Buttocks, Right upper leg     Bathing assist Assist Level: Moderate Assistance - Patient 50 - 74%     Upper Body Dressing/Undressing Upper body dressing   What is the patient wearing?: Hospital gown only    Upper body assist Assist Level: Minimal Assistance - Patient > 75%    Lower Body Dressing/Undressing Lower body dressing      What is the patient wearing?: Underwear/pull up, Hospital gown only     Lower body assist Assist for lower body dressing: Maximal Assistance - Patient 25 - 49%     Toileting Toileting    Toileting assist Assist for  toileting: Maximal Assistance - Patient 25 - 49%     Transfers Chair/bed transfer  Transfers assist     Chair/bed transfer assist level: Moderate Assistance - Patient 50 - 74%     Locomotion Ambulation   Ambulation assist      Assist level: Minimal Assistance - Patient > 75% Assistive device: Walker-rolling Max distance: 8 ft   Walk 10 feet activity   Assist  Walk 10 feet activity did not occur: Safety/medical concerns        Walk 50 feet activity   Assist Walk 50 feet with 2 turns activity did not occur: Safety/medical concerns         Walk 150 feet activity   Assist Walk 150 feet activity did not occur: Safety/medical concerns         Walk 10 feet on uneven surface  activity   Assist Walk 10 feet on uneven surfaces activity did not occur: Safety/medical concerns         Wheelchair     Assist Is the patient using a wheelchair?: Yes Type of Wheelchair: Manual    Wheelchair assist level: Dependent - Patient 0% Max wheelchair distance: 400 ft    Wheelchair 50 feet with 2 turns activity    Assist        Assist Level: Dependent - Patient 0%   Wheelchair 150 feet activity     Assist      Assist Level: Dependent - Patient 0%   Blood pressure 118/74, pulse 95, temperature 98.5 F (36.9 C), temperature source Oral, resp. rate 17, height 6' (1.829 m), weight (!) 154 kg, SpO2 98%.  Medical Problem List and Plan: 1. Functional deficits secondary to debility from MSSA bacteremia, c/b multiple medical comorbidities             -patient may shower with buttocks wound and lines covered             -ELOS/Goals: 16-18 days, supervision PT/OT, team conf in am     2.  Antithrombotics: -DVT/anticoagulation:  Pharmaceutical: Eliquis 5 mg BID             -antiplatelet therapy: none   3. Pain Management: Tylenol as needed             -PO Dilaudid initiated on acute side due to nausea with oxycodone--> On admission patient requests  to switch back, changed back to oxycodone 15 mg Q4H PRN and Dced Dilaudid, Has only taken 4  x oxy IR 15mg  tabs in the last 48h 11/12              - For neuropathic pain gabapentin 300 mg MWF  4. Mood/Behavior/Sleep: LCSW to evaluate and provide emotional support             -continue melatonin 5 mg q HS             -antipsychotic agents: n/a          -11/11 Start alprazolam 0.25mg  PRN BID for anxiety   5. Neuropsych/cognition: This patient is capable of making decisions on his own behalf.              - Would benefit from neuropsych consult due to above   6. Skin/Wound Care: Routine skin care checks             -pressure injury left buttock/posterior thigh>>continue wound care              - Offered LAL mattress given HX pressure wounds; patient declines- RIght buttocks wound has been healing well with standard mattress   7. Fluids/Electrolytes/Nutrition: Routine Is and Os and follow-up chemistries -Renal diet with 1200 cc fluid restriction             -Continue Fosrenol 2000 mg 3 times daily with meals   8: Hypotension: monitor TID and prn             -continue midodrine 30 mg 3 times daily with meals Encourage patient to discuss dosing with nephrology. No current orthostatic drops on current dose   11/11 BP controlled   9: pAF: on Eliquis              -unable to tolerate amiodarone (requires midodrine for BP so not a candidate for nodal agents)   10: Hypothyroidism: Continue Synthroid 25 mcg daily   11: Chronic systolic heart failure: EF ~30-35%             -continue digoxin 0.125 mg  3 x weekly             -daily weight             -volume status>>HD 3 x weekly   12: Chronic hypoxic respiratory failure: on home O2 5 L via [place]   13: LE neuropathy/ R foot drop: CAM boot              - Pattern of weakness and sensory loss consistent with sciatic neuropathy above the knee (sparing hamstrings); discussed this with patient on admission, likely compressive from ICU  stay  - hopeful for improvement and likely will need EMG as OP -11/11 Continue TED hoses patient reports this is helping his neuropathic pain.  Gabapentin dose limited by renal function.  Could consider trying Lyrica instead of gabapentin if pain worsens   14: Morbid Obesity: Body mass index is 46.05 kg/m.    15: Anemia: on ESP   16: History or HIT positivity>>no heparin   17: MSSA bacteremia: will continue Ancef 2 g with HD treatment 3x/week (through 12/09)   18. Vocal hoarseness s/p extubation. Consider ENT evaluation if no improvement.   19.  ESRD on HD- nephro to manage   -Reviewed last nephrology note  20. Insomnia  -Will schedule melatonin 5mg   LOS: 4 days A FACE TO FACE EVALUATION WAS PERFORMED  Erick Colace 06/07/2023, 9:20 AM

## 2023-06-08 DIAGNOSIS — I5022 Chronic systolic (congestive) heart failure: Secondary | ICD-10-CM | POA: Diagnosis not present

## 2023-06-08 DIAGNOSIS — N186 End stage renal disease: Secondary | ICD-10-CM | POA: Diagnosis not present

## 2023-06-08 DIAGNOSIS — G5701 Lesion of sciatic nerve, right lower limb: Secondary | ICD-10-CM | POA: Diagnosis not present

## 2023-06-08 DIAGNOSIS — R5381 Other malaise: Secondary | ICD-10-CM | POA: Diagnosis not present

## 2023-06-08 LAB — RENAL FUNCTION PANEL
Albumin: 3.2 g/dL — ABNORMAL LOW (ref 3.5–5.0)
Anion gap: 17 — ABNORMAL HIGH (ref 5–15)
BUN: 25 mg/dL — ABNORMAL HIGH (ref 6–20)
CO2: 20 mmol/L — ABNORMAL LOW (ref 22–32)
Calcium: 10.5 mg/dL — ABNORMAL HIGH (ref 8.9–10.3)
Chloride: 97 mmol/L — ABNORMAL LOW (ref 98–111)
Creatinine, Ser: 9.32 mg/dL — ABNORMAL HIGH (ref 0.61–1.24)
GFR, Estimated: 7 mL/min — ABNORMAL LOW (ref >60)
Glucose, Bld: 105 mg/dL — ABNORMAL HIGH (ref 70–99)
Phosphorus: 4.5 mg/dL (ref 2.5–4.6)
Potassium: 3.9 mmol/L (ref 3.5–5.1)
Sodium: 134 mmol/L — ABNORMAL LOW (ref 135–145)

## 2023-06-08 LAB — CBC
HCT: 35.5 % — ABNORMAL LOW (ref 39.0–52.0)
Hemoglobin: 11 g/dL — ABNORMAL LOW (ref 13.0–17.0)
MCH: 30.6 pg (ref 26.0–34.0)
MCHC: 31 g/dL (ref 30.0–36.0)
MCV: 98.6 fL (ref 80.0–100.0)
Platelets: 307 10*3/uL (ref 150–400)
RBC: 3.6 MIL/uL — ABNORMAL LOW (ref 4.22–5.81)
RDW: 15.4 % (ref 11.5–15.5)
WBC: 8.2 10*3/uL (ref 4.0–10.5)
nRBC: 0 % (ref 0.0–0.2)

## 2023-06-08 MED ORDER — ANTICOAGULANT SODIUM CITRATE 4% (200MG/5ML) IV SOLN: 5.0000 mL | Status: DC | PRN

## 2023-06-08 MED ORDER — LIDOCAINE HCL (PF) 1 % IJ SOLN
5.0000 mL | INTRAMUSCULAR | Status: DC | PRN
Start: 2023-06-08 — End: 2023-06-08

## 2023-06-08 MED ORDER — NEPRO/CARBSTEADY PO LIQD: 237.0000 mL | ORAL | Status: DC | PRN

## 2023-06-08 MED ORDER — ALTEPLASE 2 MG IJ SOLR: 2.0000 mg | Freq: Once | INTRAMUSCULAR | Status: DC | PRN

## 2023-06-08 MED ORDER — PENTAFLUOROPROP-TETRAFLUOROETH EX AERO
1.0000 | INHALATION_SPRAY | CUTANEOUS | Status: DC | PRN
Start: 2023-06-08 — End: 2023-06-08

## 2023-06-08 MED ORDER — LIDOCAINE-PRILOCAINE 2.5-2.5 % EX CREA: 1.0000 | TOPICAL_CREAM | CUTANEOUS | Status: DC | PRN

## 2023-06-08 NOTE — Progress Notes (Signed)
Physical Therapy Session Note  Patient Details  Name: Phillip Young MRN: [id number] Date of Birth: 18-Jan-1986  Today's Date: 06/08/2023 PT Individual Time: 9528-4132 and 1105-1210 PT Individual Time Calculation (min): 70 min and 65 min  Short Term Goals: Week 1:  PT Short Term Goal 1 (Week 1): Pt will perform bed mobility with overall supervision with no c/o dizziness and change in HR <20 bpm. PT Short Term Goal 2 (Week 1): Pt will perform sit<>stand with CGA and seat height at 90/90 hip/ knee angles. PT Short Term Goal 3 (Week 1): Pt will perform stand pivot transfers with CGA/ MinA using LRAD. PT Short Term Goal 4 (Week 1): pt will ambulate at least 20 ft using LRAD with overall CGA/ MinA.  Skilled Therapeutic Interventions/Progress Updates:    Session 1: Pt received in bed, alarm activated, agreeable to PT.  Pt on 4L O2 via nasal cannula from wall, pt asked to be brought back down to 2L. Transitioned to portable tank on 2L.  Bed Mobility: minA supine<>sitting. Assistance for leg management d/t high neuropathic pain and decreased strength. Use of bed features (bed rail and HOB elevated).  TotalA donning B shoes in supine. High neuropathic pain during task.  STS, minA: pt performed x2 from EOB w/ height of bed elevated. As well as multiple throughout session from [facility]. Use of B arm rests to assist STS. Verbal cues to lean forward more. Poor eccentric control.  Gait training for endurance: RW, CGA, Walker height adjusted to improve posture and energy expenditure. +2 close WC follow - 10 ft + 5 ft + 10 ft + 10 ft(donned R DF assist ace wrap for gait trial & all the following trials) + 10 ft + 5 ft Pt ambulates with the following gait deviations: R toe drop, decreased velocity & endurance, increased UE support, quick need to sit increased, forward flexed hip, increased R hip flexion to compensate for drop foot (improvements seen after DF assist, pt reports he feels "heel to toe action" with  the ace wrap donned). - Pt dropped to 92% O2 during ambulation on 2L but was able to improve up to 98% quickly w/ verbal cues to take a few deep breathes.  Pt educated on finding larger comfortable shoes he can tolerate w/ the neuropathic pain. As well educated on the use of an AFO. Plan to show AFO in next session.  Pt placed back on wall air 2L O2  Pt left in bed, alarm activated, all needs met.  Session 2: Pt received in WC, alarm activated, agreeable to PT.  Pt on 2L O2 via nasal cannula from wall, transitioned to portable tank on 2L.  Pt dependently transported to gym for energy conservation.  Pt educated about AFO and shown device. Pt reports liking device and will get shoes to be able to utilize and trial device in CIR.  STS from [facility] minA w/ armrests to assist. Verbal cues to lean forward more. Pt demo improvements in eccentric control by using RUE to control descent on WC armrest.  Gait training for endurance: R DF assist ace wrap for all trials, minA w/ RW, +2 close WC follow - 14 ft + 15 ft + 12 ft Pt demo same gait deviations as morning session.  SpO2 100%  Stair training: - x2 4" step, BHR, minA, extra motivation and encouragement provided. Pt performed x1 w/ R up, L down. Then performed x1 w/ L up, R down. - repeat x2 on each side  Pt  benefits from motivational interviewing and therapeutic listening to build rapport and encourage challenging himself.  Pt dependently transported back to room d/t fatigue.  TotalA doff B shoes sitting on EOB. Bed mobility same as earlier session.  Pt placed back on wall air 2L O2.  Pt left in bed, alarm activated, all needs met.  Therapy Documentation Precautions:  Precautions Precautions: Fall Precaution Comments: R sided weakness, R foot drop, O2 with activity, watch HR (has a-fib), Stage 3 wound on buttock Required Braces or Orthoses: Other Brace Other Brace: R foot-up brace, or CAM boot Restrictions Weight Bearing  Restrictions: No Pain: Pain Assessment Pain Scale: 0-10 Pain Score: 9  Pain Type: Chronic pain;Neuropathic pain Pain Location: Leg Pain Orientation: Right;Left Pain Descriptors / Indicators: Aching;Burning Pain Frequency: Constant Pain Intervention(s): Medication (See eMAR)   Therapy/Group: Individual Therapy  Phillip Young 06/08/2023, 9:21 AM

## 2023-06-08 NOTE — Patient Care Conference (Signed)
Inpatient RehabilitationTeam Conference and Plan of Care Update Date: 06/08/2023   Time: 10:35 AM    Patient Name: Phillip Young      Medical Record Number: [id number]  Date of Birth: [birth year] Sex: Male         Room/Bed: 4W26C/4W26C-01 Payor Info: Payor: MEDICAID PENDING / Plan: MEDICAID PENDING / Product Type: *No Product type* /    Admit Date/Time:  06/03/2023  3:31 PM  Primary Diagnosis:  Debility  Hospital Problems: Principal Problem:   Debility Active Problems:   Metabolic encephalopathy    Expected Discharge Date: Expected Discharge Date: 06/21/23  Team Members Present: Physician leading conference: Dr. Claudette Laws Social Worker Present: Lavera Guise, BSW Nurse Present: Chana Bode, RN PT Present: Casimiro Needle, PT OT Present: Bonnell Public, OT SLP Present: Everardo Pacific, SLP PPS Coordinator present : Fae Pippin, SLP     Current Status/Progress Goal Weekly Team Focus  Bowel/Bladder   Pt continent of bowl. oliguria   Remain continent   Assist with toileting qshift and prn    Swallow/Nutrition/ Hydration               ADL's   UB bathing/dressing EOB supervision, LB bathing/dressing min A, toileting mod A, stand pivot transfers to toilet min A to light mod A to power up to stnaidng position. Barriers: self-limiting, decreased activity tolerance, decreased motivation, anxiety, O2 support   superivsion   activity tolerance, energy conservation, pt education, functional transfers, BADL retrianing    Mobility   Bed Mobility - Supervision using hospital bed features; Transfers - uses B UE on RW to assist (CGA/min A) and increased assistance with hand push off surface (possibly min/mod due to decreased B LE strength); ambulation - short distances ~47ft with CGA/light minA and R LE foot drop; haven't attempted stairs yet   supervision transfers and gait, CGA stair navigation  Barriers - self limiting, Decreased strength on R side (LE/UE), decreased  tolerance to physical movemement. Focus - increase endurance to therapy, strength training, gait, and stairs    Communication                Safety/Cognition/ Behavioral Observations               Pain   pt c/o bilateral lower leg and foot pain, 9/10, prn meds given   <3 pain score   Assess qshift and prn    Skin   Bilateral stage 3 on buttocks, and wound on inner thigh, Clean with NS, apply medihoney, cover with dy gauze and foam.   Promote healing, Maintain skin integrity  Assess qshift and prn      Discharge Planning:  Discharging home with mother who is only able to provide supervision. Mother stays in basement level apartment ment requiring patient to walk down steps or navigate around the building to aviod steps.  HD patient. Barrier: insurance and caregiver   Team Discussion: Patient post acute respiratory distress; debility. Limited by perseveration on pain, situational stressors and self limiting behaviors. Progress hindered by right foot drop and rotator cuff injury vs nerve Impingement.   Patient on target to meet rehab goals: yes, currently needs supervision for upper body care at the edge of the bed and min assist for lower body care with mod assist for toileting.  Needs min - mod assist for stand pivot transfers using a RW.  Completes sit -s tand with CGA and able to ambulate up to 10' with CGA. Goals for discharge set for supervision overall.  *  See Care Plan and progress notes for long and short-term goals.   Revisions to Treatment Plan:  Needs larger sized shoes AFO consult Neuro psych referral Titrate oxygen with therapy   Teaching Needs: Safety, medications, skin care/wound care, dietary modifications, transfers, toileting,etc.   Current Barriers to Discharge: Decreased caregiver support and Home enviroment access/layout  Possible Resolutions to Barriers: Family education  Marrianne Mood, MD Follow up.  Specialty: Internal Medicine    [facility]               Follow up on 06/30/2023.  Why: 7:45 am      Medical Summary Current Status: pain control still an issues pt does not want any further med changes, resp status stable , no sign of CHF clinically  Barriers to Discharge: Cardiac Complications;Uncontrolled Pain;Renal Insufficiency/Failure;Morbid Obesity   Possible Resolutions to [employer] Focus: cont gradual escalation of therapy intensity, monitor pain c/os , monitor labs   Continued Need for Acute Rehabilitation Level of Care: The patient requires daily medical management by a physician with specialized training in physical medicine and rehabilitation for the following reasons: Direction of a multidisciplinary physical rehabilitation program to maximize functional independence : Yes Medical management of patient stability for increased activity during participation in an intensive rehabilitation regime.: Yes Analysis of laboratory values and/or radiology reports with any subsequent need for medication adjustment and/or medical intervention. : Yes   I attest that I was present, lead the team conference, and concur with the assessment and plan of the team.   Chana Bode B 06/08/2023, 2:17 PM

## 2023-06-08 NOTE — Progress Notes (Signed)
Team Conference Report to Patient/Family  Team Conference discussion was reviewed with the patient and caregiver, including goals, any changes in plan of care and target discharge date.  Patient and caregiver express understanding and are in agreement.  The patient has a target discharge date of 06/21/23.  SW met with patient and called mother, [name] at bedside. Patient improving. Pt and mother agreeable to discharge on 11/26. No additional questions or concerns currently.   Andria Rhein 06/08/2023, 2:59 PM

## 2023-06-08 NOTE — Progress Notes (Signed)
Occupational Therapy Session Note  Patient Details  Name: Phillip Young MRN: [id number] Date of Birth: 10-Jan-1986  Today's Date: 06/08/2023 OT Individual Time: 3016-0109 OT Individual Time Calculation (min): 75 min   Short Term Goals: Week 1:  OT Short Term Goal 1 (Week 1): Pt will perform toilet and shower transfers with min a with LRAD OT Short Term Goal 2 (Week 1): Pt will complete LB bathing with LH sponge and min a OT Short Term Goal 3 (Week 1): Pt will stand at sink side for oral care for 2 min with min a  Skilled Therapeutic Interventions/Progress Updates:     Pt received semi-reclined in bed finishing previous PT session. Pt presenting to be mildly fatigued, however in good spirits receptive to skilled OT session reporting 0/10 pain at rest- OT offering intermittent rest breaks, repositioning, and therapeutic support to optimize participation in therapy session. Pain 2/2 neuropathy noted during session during bed mobility and donning shoes. Pt maintained on 2L O2 throughout session with PSO2 remaining >92% with light physical activity. Provided short rest break at beginning of session to support moral and participation in session. Pt transitioned to EOB with bed slightly elevated using modified log rolling technique with min A to lift trunk. Shoes donned total for time and pain management. Pt completed short distance functional mobility to wc using RW with min A required for RW management and balance with mod verbal and tactile cues provided for R step length and weight shifting. Transported Pt total A to therapy gym in wc for time management and energy conservation. Stand step transfer using RW wc>EOM min A +increased time with mod verbal/tactile cues required for technique and R foot placement. Addressed [name] LB strength and balance deficits with Pt engaged Pt in completing blocked practice of sit<>stands during bean bag toss dynamic standing balance activity. Facilitated posterior reaching  and dual tasking challenge in standing with Pt instructed to complete sit<>stand using RW for balance, reach posteriorly to retrieve bean bag, and maintain balance while throwing bean bag at target. Pt able to complete 3x8 reps with min A and increased time required for rest breaks between trials. Worked on alternate hand positioning as Pt tends to push up from RW when moving from sit<>stands, however Pt unable to power up to standing position with single hand on RW at this time d/t LB strength deficits. Pt completed short distance functional mobility from EOM>NuStep ~7 ft with min A provided for balance and RW management with w/c follow required to increase safety d/t [name] fatigue level. U/LB strength and endurance training on NuStep with Pt able to complete 5 minutes at level 2 setting at slowed pace and mod therapeutic support required to motivate patients. Stand step NuStep>wc using RW heavy min A d/t fatigue. Transported Pt back to room total A in wc for energy conservation. Pt was left resting in wc with call bell in reach, seatbelt alarm on, 2L O2 donned, and all needs met.    Therapy Documentation Precautions:  Precautions Precautions: Fall Precaution Comments: R sided weakness, R foot drop, O2 with activity, watch HR (has a-fib), Stage 3 wound on buttock Required Braces or Orthoses: Other Brace Other Brace: R foot-up brace, or CAM boot Restrictions Weight Bearing Restrictions: No   Therapy/Group: Individual Therapy  Clide Deutscher 06/08/2023, 8:01 AM

## 2023-06-08 NOTE — Progress Notes (Signed)
Received patient in bed to unit.  Alert and oriented.  Informed consent signed and in chart.   TX duration:3..30  Patient tolerated well.  Transported back to the room  Alert, without acute distress.  Hand-off given to patient's nurse.   Access used: catheter Access issues: n/a  Total UF removed: 2L Medication(s) given: Cefazolin Post HD weight: 153.7KG   06/08/23 1835  Vitals  Temp 98.1 F (36.7 C)  Temp Source Oral  BP 106/80  MAP (mmHg) 90  Pulse Rate 62  ECG Heart Rate (!) 110  Resp (!) 21  Oxygen Therapy  SpO2 98 %  O2 Device Nasal Cannula  O2 Flow Rate (L/min) 2 L/min  During Treatment Monitoring  Blood Flow Rate (mL/min) 0 mL/min  Arterial Pressure (mmHg) -74.34 mmHg  Venous Pressure (mmHg) 56.16 mmHg  TMP (mmHg) 9.09 mmHg  Ultrafiltration Rate (mL/min) 773 mL/min  Dialysate Flow Rate (mL/min) 300 ml/min  Duration of HD Treatment -hour(s) 3.5 hour(s)  Cumulative Fluid Removed (mL) per Treatment  2000.13  HD Safety Checks Performed Yes  Intra-Hemodialysis Comments Tx completed  Post Treatment  Dialyzer Clearance Lightly streaked  Hemodialysis Intake (mL) 60 mL  Liters Processed 84  Fluid Removed (mL) 2000 mL  Tolerated HD Treatment Yes  Hemodialysis Catheter Right Internal jugular Double lumen Permanent (Tunneled)  Placement Date/Time: 05/30/23 0856   Serial / Lot #: [id number]  Expiration Date: 09/23/27  Time Out: Correct patient;Correct site;Correct procedure  Maximum sterile barrier precautions: Hand hygiene;Cap;Mask;Sterile gown;Sterile gloves;Large sterile s...  Site Condition No complications  Blue Lumen Status Dead end cap in place  Red Lumen Status Dead end cap in place  Catheter fill solution Other (Comment) (sodium citrate)  Catheter fill volume (Arterial) 1.9 cc  Catheter fill volume (Venous) 1.9  Dressing Type Transparent  Dressing Status Antimicrobial disc in place  Dressing Change Due 06/12/23  Post treatment catheter status Capped and  Clamped        [facility]

## 2023-06-08 NOTE — Progress Notes (Signed)
PROGRESS NOTE   Subjective/Complaints:  Seen in OT, standing activities , pt shows reduced activity tolerance , has HD today   ROS- neg for fevers, chills, CP, SOB, N/V/D + Pain in distal lower extremities  Objective:   No results found. Recent Labs    06/06/23 1418  WBC 9.3  HGB 10.3*  HCT 33.2*  PLT 345    Recent Labs    06/06/23 1418  NA 137  K 3.9  CL 97*  CO2 24  GLUCOSE 97  BUN 29*  CREATININE 10.18*  CALCIUM 10.1     Intake/Output Summary (Last 24 hours) at 06/08/2023 1031 Last data filed at 06/08/2023 0759 Gross per 24 hour  Intake 480 ml  Output --  Net 480 ml     Pressure Injury 05/18/23 Buttocks Right Stage 3 -  Full thickness tissue loss. Subcutaneous fat may be visible but bone, tendon or muscle are NOT exposed. (Active)  05/18/23 2300  Location: Buttocks  Location Orientation: Right  Staging: Stage 3 -  Full thickness tissue loss. Subcutaneous fat may be visible but bone, tendon or muscle are NOT exposed.  Wound Description (Comments):   Present on Admission: Yes (original date 05/18/23; present on admit to CIR 06/03/23)     Pressure Injury 05/18/23 Buttocks Left Stage 2 -  Partial thickness loss of dermis presenting as a shallow open injury with a red, pink wound bed without slough. (Active)  05/18/23 2300  Location: Buttocks  Location Orientation: Left  Staging: Stage 2 -  Partial thickness loss of dermis presenting as a shallow open injury with a red, pink wound bed without slough.  Wound Description (Comments):   Present on Admission: Yes (original date 05/18/23; present on admit to CIR 06/03/23)    Physical Exam: Vital Signs Blood pressure (!) 125/98, pulse 94, temperature 97.9 F (36.6 C), resp. rate 17, height 6' (1.829 m), weight (!) 154 kg, SpO2 100%.  General: No acute distress, sitting in wheelchair Patient is a little anxious this morning, pleasant overall Heart:  Regular rate and rhythm no rubs murmurs or extra sounds Lungs: Clear to auscultation, breathing unlabored, no rales or wheezes Abdomen: Positive bowel sounds, soft nontender to palpation, nondistended Extremities: No clubbing, cyanosis, or edema Skin: No evidence of breakdown, no evidence of rash Right gluteal , probed with Q tip 3cm , no foul smell -not examined today  Left hip  Neurologic: Cranial nerves II through XII intact, motor strength is 5/5 in bilateral deltoid, bicep, tricep, grip, 0/5 right  ankle dorsiflexor and plantar flexor Sensory exam absent LT sensation RIght foot/ankle  Left foot with hyperesthesia to LT  Musculoskeletal: Full range of motion in all 4 extremities. No joint swelling  Assessment/Plan: 1. Functional deficits which require 3+ hours per day of interdisciplinary therapy in a comprehensive inpatient rehab setting. Physiatrist is providing close team supervision and 24 hour management of active medical problems listed below. Physiatrist and rehab team continue to assess barriers to discharge/monitor patient progress toward functional and medical goals  Care Tool:  Bathing    Body parts bathed by patient: Right arm, Left arm, Chest, Abdomen, Front perineal area, Face   Body parts  bathed by helper: Left lower leg, Right lower leg, Left upper leg, Buttocks, Right upper leg     Bathing assist Assist Level: Moderate Assistance - Patient 50 - 74%     Upper Body Dressing/Undressing Upper body dressing   What is the patient wearing?: Hospital gown only    Upper body assist Assist Level: Minimal Assistance - Patient > 75%    Lower Body Dressing/Undressing Lower body dressing      What is the patient wearing?: Underwear/pull up, Hospital gown only     Lower body assist Assist for lower body dressing: Maximal Assistance - Patient 25 - 49%     Toileting Toileting    Toileting assist Assist for toileting: Maximal Assistance - Patient 25 - 49%      Transfers Chair/bed transfer  Transfers assist     Chair/bed transfer assist level: Moderate Assistance - Patient 50 - 74%     Locomotion Ambulation   Ambulation assist      Assist level: Minimal Assistance - Patient > 75% Assistive device: Walker-rolling Max distance: 8 ft   Walk 10 feet activity   Assist  Walk 10 feet activity did not occur: Safety/medical concerns        Walk 50 feet activity   Assist Walk 50 feet with 2 turns activity did not occur: Safety/medical concerns         Walk 150 feet activity   Assist Walk 150 feet activity did not occur: Safety/medical concerns         Walk 10 feet on uneven surface  activity   Assist Walk 10 feet on uneven surfaces activity did not occur: Safety/medical concerns         Wheelchair     Assist Is the patient using a wheelchair?: Yes Type of Wheelchair: Manual    Wheelchair assist level: Dependent - Patient 0% Max wheelchair distance: 400 ft    Wheelchair 50 feet with 2 turns activity    Assist        Assist Level: Dependent - Patient 0%   Wheelchair 150 feet activity     Assist      Assist Level: Dependent - Patient 0%   Blood pressure (!) 125/98, pulse 94, temperature 97.9 F (36.6 C), resp. rate 17, height 6' (1.829 m), weight (!) 154 kg, SpO2 100%.  Medical Problem List and Plan: 1. Functional deficits secondary to debility from MSSA bacteremia, c/b multiple medical comorbidities             -patient may shower with buttocks wound and lines covered             -ELOS/Goals: 16-18 days, supervision PT/OT,  Team conference today please see physician documentation under team conference tab, met with team  to discuss problems,progress, and goals. Formulized individual treatment plan based on medical history, underlying problem and comorbidities.     2.  Antithrombotics: -DVT/anticoagulation:  Pharmaceutical: Eliquis 5 mg BID             -antiplatelet therapy:  none   3. Pain Management: Tylenol as needed             -PO Dilaudid initiated on acute side due to nausea with oxycodone--> On admission patient requests to switch back, changed back to oxycodone 15 mg Q4H PRN and Dced Dilaudid, Has only taken 4  x oxy IR 15mg  tabs in the last 48h 11/12              - For neuropathic  pain gabapentin 300 mg MWF               4. Mood/Behavior/Sleep: LCSW to evaluate and provide emotional support             -continue melatonin 5 mg q HS             -antipsychotic agents: n/a          -11/11 Start alprazolam 0.25mg  PRN BID for anxiety   5. Neuropsych/cognition: This patient is capable of making decisions on his own behalf.              - Would benefit from neuropsych consult due to above   6. Skin/Wound Care: Routine skin care checks             -pressure injury left buttock/posterior thigh>>continue wound care              - Offered LAL mattress given HX pressure wounds; patient declines- RIght buttocks wound has been healing well with standard mattress   7. Fluids/Electrolytes/Nutrition: Routine Is and Os and follow-up chemistries -Renal diet with 1200 cc fluid restriction             -Continue Fosrenol 2000 mg 3 times daily with meals   8: Hypotension: monitor TID and prn             -continue midodrine 30 mg 3 times daily with meals Encourage patient to discuss dosing with nephrology. No current orthostatic drops on current dose    Vitals:   06/07/23 1924 06/08/23 0436  BP: 123/73 (!) 125/98  Pulse: (!) 53 94  Resp: 17   Temp: 98.2 F (36.8 C) 97.9 F (36.6 C)  SpO2: 100% 100%      9: pAF: on Eliquis              -unable to tolerate amiodarone (requires midodrine for BP so not a candidate for nodal agents)   10: Hypothyroidism: Continue Synthroid 25 mcg daily   11: Chronic systolic heart failure: EF ~30-35%             -continue digoxin 0.125 mg  3 x weekly             -daily weight             -volume status>>HD 3 x weekly    12: Chronic hypoxic respiratory failure: on home O2 5 L via [place]   13: LE neuropathy/ R foot drop: CAM boot              - Pattern of weakness and sensory loss consistent with sciatic neuropathy above the knee (sparing hamstrings); discussed this with patient on admission, likely compressive from ICU stay  - hopeful for improvement and likely will need EMG as OP -11/11 Continue TED hoses patient reports this is helping his neuropathic pain.  Gabapentin dose limited by renal function pt feels dosing after HD is more helpful .  Could consider trying Lyrica instead of gabapentin if pain worsens   14: Morbid Obesity: Body mass index is 46.05 kg/m.    15: Anemia: on ESP   16: History or HIT positivity>>no heparin   17: MSSA bacteremia: will continue Ancef 2 g with HD treatment 3x/week (through 12/09)   18. Vocal hoarseness s/p extubation. Consider ENT evaluation if no improvement.   19.  ESRD on HD- nephro to manage   -Reviewed last nephrology note  20. Insomnia  -Will schedule melatonin 5mg   LOS: 5  days A FACE TO FACE EVALUATION WAS PERFORMED  Erick Colace 06/08/2023, 10:31 AM

## 2023-06-08 NOTE — Progress Notes (Signed)
PHARMACY CONSULT NOTE FOR:    OUTPATIENT  PARENTERAL ANTIBIOTIC THERAPY (OPAT) (from 11/4)   This OPAT note is informational only. Nephrology is aware and will give with HD.    Indication: MSSA bacteremia Regimen: Cefazolin 2 g IV MWF with HD End date: 07/06/23   IV antibiotic discharge orders are pended. To discharging provider:  please sign these orders via discharge navigator,  Select New Orders & click on the button choice - Manage This Unsigned Work.      Thank you for allowing pharmacy to be a part of this patient's care.

## 2023-06-08 NOTE — Progress Notes (Signed)
[facility] Progress Note   Subjective:   Patient seen in room, tired after therapy and some nausea today. Denies SOB, CP, dizziness, nausea. He expresses that he does not understand how is kidneys failed, reviewed notes from 2022 hospitalization and discussed how cardiogenic shock/cardiorenal syndrome impact the kidneys.   Objective Vitals:   06/07/23 0341 06/07/23 1541 06/07/23 1924 06/08/23 0436  BP: 118/74 101/74 123/73 (!) 125/98  Pulse: 95 76 (!) 53 94  Resp: 17 18 17    Temp: 98.5 F (36.9 C) 98.2 F (36.8 C) 98.2 F (36.8 C) 97.9 F (36.6 C)  TempSrc: Oral     SpO2: 98% 99% 100% 100%  Weight:      Height:       Physical Exam  General: Alert male in NAD Heart: RRR, no murmurs, rubs or gallops Lungs: CTA bilaterally, on O2 via [place] Abdomen: Soft, non-distended, +BS Extremities: No edema b/l lower extremities Dialysis Access: [facility] with intact bandage    Additional Objective Labs: Basic Metabolic Panel: Recent Labs  Lab 06/02/23 0445 06/03/23 0419 06/06/23 1418  NA 135 135 137  K 3.9 4.0 3.9  CL 98 98 97*  CO2 26 23 24   GLUCOSE 87 98 97  BUN 18 26* 29*  CREATININE 7.76* 9.38* 10.18*  CALCIUM 9.9 10.4* 10.1  PHOS 4.7* 4.8* 4.6   Liver Function Tests: Recent Labs  Lab 06/02/23 0445 06/03/23 0419 06/06/23 1418  ALBUMIN 2.7* 2.7* 2.8*   No results for input(s): "LIPASE", "AMYLASE" in the last 168 hours. CBC: Recent Labs  Lab 06/03/23 0806 06/06/23 1418  WBC 6.1 9.3  HGB 9.4* 10.3*  HCT 30.7* 33.2*  MCV 100.0 100.3*  PLT 349 345   Blood Culture    Component Value Date/Time   SDES BLOOD BLOOD RIGHT HAND 05/27/2023 0959   SPECREQUEST  05/27/2023 0959    BOTTLES DRAWN AEROBIC AND ANAEROBIC Blood Culture adequate volume   CULT  05/27/2023 0959    NO GROWTH 5 DAYS Performed at [facility], [address] [place], [place] [address]    REPTSTATUS 06/01/2023 FINAL 05/27/2023 6045    Cardiac Enzymes: No results for  input(s): "CKTOTAL", "CKMB", "CKMBINDEX", "TROPONINI" in the last 168 hours. CBG: No results for input(s): "GLUCAP" in the last 168 hours. Iron Studies: No results for input(s): "IRON", "TIBC", "TRANSFERRIN", "FERRITIN" in the last 72 hours. @lablastinr3 @ Studies/Results: No results found. Medications:  anticoagulant sodium citrate      ceFAZolin (ANCEF) IV 2 g (06/06/23 1704)    apixaban  5 mg Oral BID   Chlorhexidine Gluconate Cloth  6 each Topical Q12H   Chlorhexidine Gluconate Cloth  6 each Topical Q0600   darbepoetin (ARANESP) injection - DIALYSIS  150 mcg Subcutaneous Q Sat-1800   digoxin  0.125 mg Oral Once per day on Monday Wednesday Friday   feeding supplement (NEPRO CARB STEADY)  237 mL Oral BID BM   gabapentin  300 mg Oral Q M,W,F-HD   Gerhardt's butt cream   Topical TID   lanthanum  2,000 mg Oral TID WC   leptospermum manuka honey  1 Application Topical Daily   levothyroxine  25 mcg Oral Q0600   melatonin  5 mg Oral QHS   midodrine  30 mg Oral TID WC   urea   Topical Daily    Dialysis Orders: MWF at [facility] 4.5h   400/800    172kg   2K/2Ca bath   TDC   No heparin (+ HIT)  Assessment/Plan:  1. Acute hypoxic respiratory failure: Remains on supplemental oxygen and ongoing efforts at continued ultrafiltration/lowering EDW with dialysis (anticipating that he has suffered weight loss during this hospitalization). 2. MSSA Bacteremia: TDC replaced after he underwent line holiday 05/26/23-05/30/23.  Continue cefazolin IV 2. ESRD: On hemodialysis on a Monday/Wednesday/Friday schedule and utilizing sodium citrate for dialysis catheter lock given history of heparin-induced thrombocytopenia. 3. HTN/volume: He remains on midodrine 30mg  TID, still having some soft blood pressures, continue current dose and will reeval to see if we can decrease dose daily 4. Anemia: Hemoglobin/hematocrit at goal, continue high-dose ESA weekly. 5. Secondary hyperparathyroidism: Calcium level elevated and  VDRA remains on hold, continue lanthanum for phosphorus binding/control. 6. Nutrition:  Continue renal diet and protein supplements    Rogers Blocker, PA-C 06/08/2023, 8:53 AM  [facility] Pager: [phone]

## 2023-06-09 ENCOUNTER — Inpatient Hospital Stay: Payer: Self-pay | Admitting: Internal Medicine

## 2023-06-09 DIAGNOSIS — R5381 Other malaise: Secondary | ICD-10-CM | POA: Diagnosis not present

## 2023-06-09 DIAGNOSIS — G5701 Lesion of sciatic nerve, right lower limb: Secondary | ICD-10-CM | POA: Diagnosis not present

## 2023-06-09 DIAGNOSIS — N186 End stage renal disease: Secondary | ICD-10-CM | POA: Diagnosis not present

## 2023-06-09 DIAGNOSIS — I5022 Chronic systolic (congestive) heart failure: Secondary | ICD-10-CM | POA: Diagnosis not present

## 2023-06-09 MED ORDER — CHLORHEXIDINE GLUCONATE CLOTH 2 % EX PADS
6.0000 | MEDICATED_PAD | Freq: Every day | CUTANEOUS | Status: DC
  Administered 2023-06-12: 6 via TOPICAL

## 2023-06-09 NOTE — Group Note (Signed)
Patient Details Name: Phillip Young MRN: [id number] DOB: 05-12-1986 Today's Date: 06/09/2023  Time Calculation: OT Group Time Calculation OT Group Start Time: 1430 OT Group Stop Time: 1530 OT Group Time Calculation (min): 60 min      Group Description: Dance Group: Pt participated in dance group with an emphasis on social interaction, motor planning, increasing overall activity tolerance and bimanual tasks. All songs were selected by group members. Dance moves included AROM of BUE/BLE gross motor movements with an emphasis on building functional endurance.    Individual level documentation: Patient completed group from sitting level with 0.5 L O2 donned throughout session.  Patientt needed supervision to complete various dance moves with cues to initiate rest breaks.  Patient needed min modifications during group.  Pain:  0/10  Precautions:  Falls  Clide Deutscher 06/09/2023, 4:03 PM

## 2023-06-09 NOTE — Plan of Care (Signed)
  Problem: Consults Goal: RH GENERAL PATIENT EDUCATION Description: See Patient Education module for education specifics. Outcome: Progressing   Problem: RH BOWEL ELIMINATION Goal: RH STG MANAGE BOWEL WITH ASSISTANCE Description: STG Manage Bowel with mod I Assistance. Outcome: Progressing Goal: RH STG MANAGE BOWEL W/MEDICATION W/ASSISTANCE Description: STG Manage Bowel with Medication with mod I  Assistance. Outcome: Progressing   Problem: RH SKIN INTEGRITY Goal: RH STG SKIN FREE OF INFECTION/BREAKDOWN Description: Manage skin with min assist Outcome: Progressing Goal: RH STG MAINTAIN SKIN INTEGRITY WITH ASSISTANCE Description: STG Maintain Skin Integrity With min Assistance. Outcome: Progressing Goal: RH STG ABLE TO PERFORM INCISION/WOUND CARE W/ASSISTANCE Description: STG Able To Perform Incision/Wound Care With min  Assistance. Outcome: Progressing   Problem: RH SAFETY Goal: RH STG ADHERE TO SAFETY PRECAUTIONS W/ASSISTANCE/DEVICE Description: STG Adhere to Safety Precautions With cues  Assistance/Device. Outcome: Progressing   Problem: RH PAIN MANAGEMENT Goal: RH STG PAIN MANAGED AT OR BELOW [name] PAIN GOAL Description: < 4 with prns Outcome: Progressing   Problem: RH KNOWLEDGE DEFICIT GENERAL Goal: RH STG INCREASE KNOWLEDGE OF SELF CARE AFTER HOSPITALIZATION Description: Patient and mother will be able to manage care at discharge with medications and dietary modifications using educational resources independently Outcome: Progressing   Problem: Education: Goal: Knowledge of General Education information will improve Description: Including pain rating scale, medication(s)/side effects and non-pharmacologic comfort measures Outcome: Progressing   Problem: Health Behavior/Discharge Planning: Goal: Ability to manage health-related needs will improve Outcome: Progressing   Problem: Clinical Measurements: Goal: Ability to maintain clinical measurements within normal  limits will improve Outcome: Progressing Goal: Will remain free from infection Outcome: Progressing Goal: Diagnostic test results will improve Outcome: Progressing Goal: Respiratory complications will improve Outcome: Progressing Goal: Cardiovascular complication will be avoided Outcome: Progressing   Problem: Activity: Goal: Risk for activity intolerance will decrease Outcome: Progressing   Problem: Nutrition: Goal: Adequate nutrition will be maintained Outcome: Progressing   Problem: Coping: Goal: Level of anxiety will decrease Outcome: Progressing   Problem: Elimination: Goal: Will not experience complications related to bowel motility Outcome: Progressing Goal: Will not experience complications related to urinary retention Outcome: Progressing   Problem: Pain Management: Goal: General experience of comfort will improve Outcome: Progressing   Problem: Safety: Goal: Ability to remain free from injury will improve Outcome: Progressing   Problem: Skin Integrity: Goal: Risk for impaired skin integrity will decrease Outcome: Progressing

## 2023-06-09 NOTE — Evaluation (Signed)
Recreational Therapy Assessment and Plan  Patient Details  Name: Phillip Young MRN: [id number] Date of Birth: 1986-06-28 Today's Date: 06/09/2023  Rehab Potential:  Good ELOS:   d/c 11/26  Assessment Hospital Problem: Principal Problem:   Debility     Past Medical History:      Past Medical History:  Diagnosis Date   Acute on chronic respiratory failure with hypoxia ([name]) 04/21/2021   Acute on chronic systolic (congestive) heart failure ([name]) 02/26/2020   Amiodarone toxicity     Anemia     Atrial flutter ([name])     Biventricular congestive heart failure ([name])     Chronic hypoxemic respiratory failure ([name])     Class 3 severe obesity due to excess calories with serious comorbidity and body mass index (BMI) of 50.0 to 59.9 in adult ([name]) 02/26/2020   Essential hypertension 02/26/2020   GERD without esophagitis 02/26/2020   Hidradenitis suppurativa 02/26/2020   NICM (nonischemic cardiomyopathy) ([name])     Obesity hypoventilation syndrome ([name])     OSA (obstructive sleep apnea)     PAF (paroxysmal atrial fibrillation) ([name])     Pneumonia     Prediabetes 02/26/2020        Past Surgical History:       Past Surgical History:  Procedure Laterality Date   ABSCESS DRAINAGE       AV FISTULA PLACEMENT Left 08/21/2021    Procedure: LEFT ARM ARTERIOVENOUS (AV) FISTULA.;  Surgeon: Nada Libman, MD;  Location: [facility] OR;  Service: Vascular;  Laterality: Left;   CARDIAC CATHETERIZATION       CARDIOVERSION N/A 10/09/2021    Procedure: CARDIOVERSION;  Surgeon: Laurey Morale, MD;  Location: [facility] ENDOSCOPY;  Service: Cardiovascular;  Laterality: N/A;   CARDIOVERSION N/A 05/28/2022    Procedure: CARDIOVERSION;  Surgeon: Laurey Morale, MD;  Location: [facility] ENDOSCOPY;  Service: Cardiovascular;  Laterality: N/A;   CARDIOVERSION N/A 06/07/2022    Procedure: CARDIOVERSION;  Surgeon: Laurey Morale, MD;  Location: [facility] ENDOSCOPY;  Service: Cardiovascular;  Laterality: N/A;   IR FLUORO GUIDE CV  LINE RIGHT   03/10/2021   IR FLUORO GUIDE CV LINE RIGHT   04/22/2021   IR FLUORO GUIDE CV LINE RIGHT   08/20/2021   IR FLUORO GUIDE CV LINE RIGHT   03/01/2023   IR FLUORO GUIDE CV LINE RIGHT   03/09/2023   IR FLUORO GUIDE CV LINE RIGHT   05/30/2023   IR PTA VENOUS EXCEPT DIALYSIS CIRCUIT   03/09/2023   IR REMOVAL TUN CV CATH W/O FL   05/26/2023   IR REMOVE CV FIBRIN SHEATH   03/09/2023   IR US GUIDE VASC ACCESS RIGHT   03/10/2021   IR US GUIDE VASC ACCESS RIGHT   04/22/2021   IR US GUIDE VASC ACCESS RIGHT   05/30/2023   RIGHT HEART CATH N/A 03/06/2021    Procedure: RIGHT HEART CATH;  Surgeon: Dolores Patty, MD;  Location: [facility] INVASIVE CV LAB;  Service: Cardiovascular;  Laterality: N/A;   RIGHT/LEFT HEART CATH AND CORONARY ANGIOGRAPHY N/A 03/04/2020    Procedure: RIGHT/LEFT HEART CATH AND CORONARY ANGIOGRAPHY;  Surgeon: Laurey Morale, MD;  Location: [facility] INVASIVE CV LAB;  Service: Cardiovascular;  Laterality: N/A;   TEE WITHOUT CARDIOVERSION N/A 05/05/2021    Procedure: TRANSESOPHAGEAL ECHOCARDIOGRAM (TEE);  Surgeon: Laurey Morale, MD;  Location: [facility] ENDOSCOPY;  Service: Cardiovascular;  Laterality: N/A;   TEMPORARY DIALYSIS CATHETER   03/06/2021    Procedure: TEMPORARY DIALYSIS CATHETER;  Surgeon: Gala Romney,  Bevelyn Buckles, MD;  Location: [facility] INVASIVE CV LAB;  Service: Cardiovascular;;          Assessment & Plan Clinical Impression:  Phillip Young is a 37 year old male presented to the emergency department 05/18/2023 complaining of leg swelling and pain.  He has a history of end-stage renal disease on chronic intermittent hemodialysis on Mondays, Wednesdays and Fridays.  He stated he had missed hemodialysis treatment for 1 week because of transportation issues and unable to get out of his house.  He has a history of chronic hypoxic respiratory failure chronic oxygen therapy of 2-5 L.  He also has a history of chronic HFrEF, PAF/AFL (multiple prior DCCVs, attempted ablation 12/23), concern for  amiodarone lung toxicity treated with steroids followed by Dr. Shirlee Latch.  He is maintained on Eliquis.  He was admitted to [facility] health internal medicine residency service.  Nephrology consulted for hemodialysis management.  Recently hospitalized over the summer with a prolonged hospital stay and [facility].  He was then discharged to [facility] rehab on 10/02 and discharged from there on 10/18.  Developed fever to 103 on 10/29 accompanied by hypotension, malaise.  EKG consistent with atrial fibrillation with RVR.  Sepsis workup initiated and started on broad-spectrum antibiotics.  Cardiology consulted.  Transthoracic echocardiogram performed in July 2024 had an estimated EF of 20 to 25%.  Given gentle hydration and serial lactic acid determination.  Midodrine started 30 mg 3 times daily for blood pressure support.  Cardiology unable to utilize goal-directed medical therapy secondary to hypotension as well as his end-stage renal disease.  He is not a candidate for advanced therapies or ICD.  Tunneled dialysis catheter was removed 10/31 for line holiday.  Blood cultures positive for MSSA.  Echocardiogram updated 10/31 and left ventricular ejection fraction estimated at 30% with global hypokinesis.  ID consultation obtained on 11/01 and plan 6 weeks IV antibiotics. Patient is not a candidate for TEE per primary's discussion with Dr. Anne Fu given morbid obesity, 4 to 5 L oxygen requirement, on 30 mg 3 times daily midodrine, SBP. Pressure injury of left buttock addressed. TDC replaced 11/04.  Follow-up by cardiology 11/06.  The patient remains in atrial fibrillation with reasonably controlled rate on digoxin.  He is not a candidate for amiodarone or AV nodal ablation with PPM.  He has failed attempts at DCCV and will continue Eliquis.  Dr. Duke Salvia discussed with the patient that his heart rate elevation with exertion is due to deconditioning.  Continue digoxin.  Recommend following up within 1  month of discharge and continue to check digoxin levels at least every 3 months. Tolerating, but does not like renal diet.   Pt presents with decreased activity tolerance, decreased functional mobility, decreased balance, decreased oxygen support, feelings of stress, anxiety, anger Limiting pt's independence with leisure/community pursuits.  Met with pt today to discuss TR services including leisure education, activity analysis/modifications and stress management.  Also discussed the importance of social, emotional, spiritual health in addition to physical health and their effects on overall health and wellness.  Pt stated understanding.  Pt was very emotional during this session, often tearful expressing feelings of anger about prolonged hospitalization and various events that have occurred.  Pt requesting additional mental health services.  Active listening/emotional support provided.  Team made aware of pt request.     Plan   Min 1 TR session >20 minutes during LOS Recommendations for other services: Neuropsych  Discharge Criteria: Patient will be discharged from  TR if patient refuses treatment 3 consecutive times without medical reason.  If treatment goals not met, if there is a change in medical status, if patient makes no progress towards goals or if patient is discharged from hospital.  The above assessment, treatment plan, treatment alternatives and goals were discussed and mutually agreed upon: by patient  Phillip Young 06/09/2023, 8:23 AM

## 2023-06-09 NOTE — Progress Notes (Signed)
PROGRESS NOTE   Subjective/Complaints:    ROS- neg for fevers, chills, CP, SOB, N/V/D + Pain in distal lower extremities  Objective:   No results found. Recent Labs    06/06/23 1418 06/08/23 1116  WBC 9.3 8.2  HGB 10.3* 11.0*  HCT 33.2* 35.5*  PLT 345 307    Recent Labs    06/06/23 1418 06/08/23 1116  NA 137 134*  K 3.9 3.9  CL 97* 97*  CO2 24 20*  GLUCOSE 97 105*  BUN 29* 25*  CREATININE 10.18* 9.32*  CALCIUM 10.1 10.5*     Intake/Output Summary (Last 24 hours) at 06/09/2023 0800 Last data filed at 06/08/2023 1835 Gross per 24 hour  Intake --  Output 2000 ml  Net -2000 ml     Pressure Injury 05/18/23 Buttocks Right Stage 3 -  Full thickness tissue loss. Subcutaneous fat may be visible but bone, tendon or muscle are NOT exposed. (Active)  05/18/23 2300  Location: Buttocks  Location Orientation: Right  Staging: Stage 3 -  Full thickness tissue loss. Subcutaneous fat may be visible but bone, tendon or muscle are NOT exposed.  Wound Description (Comments):   Present on Admission: Yes (original date 05/18/23; present on admit to CIR 06/03/23)     Pressure Injury 05/18/23 Buttocks Left Stage 2 -  Partial thickness loss of dermis presenting as a shallow open injury with a red, pink wound bed without slough. (Active)  05/18/23 2300  Location: Buttocks  Location Orientation: Left  Staging: Stage 2 -  Partial thickness loss of dermis presenting as a shallow open injury with a red, pink wound bed without slough.  Wound Description (Comments):   Present on Admission: Yes (original date 05/18/23; present on admit to CIR 06/03/23)    Physical Exam: Vital Signs Blood pressure 99/65, pulse 67, temperature 98 F (36.7 C), resp. rate 16, height 6' (1.829 m), weight (!) 153.7 kg, SpO2 100%.  General: No acute distress, sitting in wheelchair Patient is a little anxious this morning, pleasant overall Heart:  Regular rate and rhythm no rubs murmurs or extra sounds Lungs: Clear to auscultation, breathing unlabored, no rales or wheezes Abdomen: Positive bowel sounds, soft nontender to palpation, nondistended Extremities: No clubbing, cyanosis, or edema Skin: No evidence of breakdown, no evidence of rash Right gluteal , probed with Q tip 3cm , no foul smell -not examined today  Left hip  Neurologic: Cranial nerves II through XII intact, motor strength is 5/5 in bilateral deltoid, bicep, tricep, grip, 0/5 right  ankle dorsiflexor and plantar flexor 5/5 RIght quad 4/5 R hamstring, 5/5 left quad and hamstring and ankle  Sensory exam absent LT sensation RIght foot/ankle , no proprio Right toes , partial proprio at R ankle ,   Left foot intact sensation LT   Musculoskeletal: Full range of motion in all 4 extremities. No joint swelling  Assessment/Plan: 1. Functional deficits which require 3+ hours per day of interdisciplinary therapy in a comprehensive inpatient rehab setting. Physiatrist is providing close team supervision and 24 hour management of active medical problems listed below. Physiatrist and rehab team continue to assess barriers to discharge/monitor patient progress toward functional and medical goals  Care Tool:  Bathing    Body parts bathed by patient: Right arm, Left arm, Chest, Abdomen, Front perineal area, Face   Body parts bathed by helper: Left lower leg, Right lower leg, Left upper leg, Buttocks, Right upper leg     Bathing assist Assist Level: Moderate Assistance - Patient 50 - 74%     Upper Body Dressing/Undressing Upper body dressing   What is the patient wearing?: Hospital gown only    Upper body assist Assist Level: Minimal Assistance - Patient > 75%    Lower Body Dressing/Undressing Lower body dressing      What is the patient wearing?: Underwear/pull up, Hospital gown only     Lower body assist Assist for lower body dressing: Maximal Assistance - Patient 25  - 49%     Toileting Toileting    Toileting assist Assist for toileting: Maximal Assistance - Patient 25 - 49%     Transfers Chair/bed transfer  Transfers assist     Chair/bed transfer assist level: Moderate Assistance - Patient 50 - 74%     Locomotion Ambulation   Ambulation assist      Assist level: Minimal Assistance - Patient > 75% Assistive device: Walker-rolling Max distance: 8 ft   Walk 10 feet activity   Assist  Walk 10 feet activity did not occur: Safety/medical concerns        Walk 50 feet activity   Assist Walk 50 feet with 2 turns activity did not occur: Safety/medical concerns         Walk 150 feet activity   Assist Walk 150 feet activity did not occur: Safety/medical concerns         Walk 10 feet on uneven surface  activity   Assist Walk 10 feet on uneven surfaces activity did not occur: Safety/medical concerns         Wheelchair     Assist Is the patient using a wheelchair?: Yes Type of Wheelchair: Manual    Wheelchair assist level: Dependent - Patient 0% Max wheelchair distance: 400 ft    Wheelchair 50 feet with 2 turns activity    Assist        Assist Level: Dependent - Patient 0%   Wheelchair 150 feet activity     Assist      Assist Level: Dependent - Patient 0%   Blood pressure 99/65, pulse 67, temperature 98 F (36.7 C), resp. rate 16, height 6' (1.829 m), weight (!) 153.7 kg, SpO2 100%.  Medical Problem List and Plan: 1. Functional deficits secondary to debility from MSSA bacteremia, c/b multiple medical comorbidities             -patient may shower with buttocks wound and lines covered             -ELOS/Goals: 11/26 , supervision PT/OT,   2.  Antithrombotics: -DVT/anticoagulation:  Pharmaceutical: Eliquis 5 mg BID             -antiplatelet therapy: none   3. Pain Management: Tylenol as needed             -PO Dilaudid initiated on acute side due to nausea with oxycodone--> On  admission patient requests to switch back, changed back to oxycodone 15 mg Q4H PRN and Dced Dilaudid, Has only taken 4  x oxy IR 15mg  tabs in the last 48h 11/12              - For neuropathic pain gabapentin 300 mg MWF  4. Mood/Behavior/Sleep: LCSW to evaluate and provide emotional support             -continue melatonin 5 mg q HS             -antipsychotic agents: n/a          -11/11 Start alprazolam 0.25mg  PRN BID for anxiety   5. Neuropsych/cognition: This patient is capable of making decisions on his own behalf.              - Would benefit from neuropsych consult due to above   6. Skin/Wound Care: Routine skin care checks             -pressure injury left buttock/posterior thigh>>continue wound care              - Offered LAL mattress given HX pressure wounds; patient declines- RIght buttocks wound has been healing well with standard mattress   7. Fluids/Electrolytes/Nutrition: Routine Is and Os and follow-up chemistries -Renal diet with 1200 cc fluid restriction             -Continue Fosrenol 2000 mg 3 times daily with meals   8: Hypotension: monitor TID and prn             -continue midodrine 30 mg 3 times daily with meals Encourage patient to discuss dosing with nephrology. No current orthostatic drops on current dose    Vitals:   06/08/23 2114 06/09/23 0500  BP: 112/67 99/65  Pulse: 78 67  Resp: 17 16  Temp: 98.1 F (36.7 C) 98 F (36.7 C)  SpO2: 100% 100%      9: pAF: on Eliquis              -unable to tolerate amiodarone (requires midodrine for BP so not a candidate for nodal agents)   10: Hypothyroidism: Continue Synthroid 25 mcg daily   11: Chronic systolic heart failure: EF ~30-35%             -continue digoxin 0.125 mg  3 x weekly             -daily weight             -volume status>>HD 3 x weekly   12: Chronic hypoxic respiratory failure: on home O2 5 L via [place]   13: LE neuropathy/ R foot drop: CAM boot              - Pattern of  weakness and sensory loss consistent with sciatic neuropathy above the knee (mild weakness  hamstrings); likely compressive from ICU stay  - hopeful for improvement and likely will need EMG as OP -11/11 Continue TED hoses patient reports this is helping his neuropathic pain.  Gabapentin dose limited by renal function pt feels dosing after HD is more helpful .  Could consider trying Lyrica instead of gabapentin if pain worsens   14: Morbid Obesity: Body mass index is 45.96 kg/m.    15: Anemia: on ESP   16: History or HIT positivity>>no heparin   17: MSSA bacteremia: will continue Ancef 2 g with HD treatment 3x/week (through 12/09)   18. Vocal hoarseness s/p extubation. Consider ENT evaluation if no improvement.   19.  ESRD on HD- nephro to manage   -Reviewed last nephrology note  20. Insomnia  -Will schedule melatonin 5mg   LOS: 6 days A FACE TO FACE EVALUATION WAS PERFORMED  Erick Colace 06/09/2023, 8:00 AM

## 2023-06-09 NOTE — Progress Notes (Deleted)
Patient Active Problem List   Diagnosis Date Noted   Metabolic encephalopathy 06/07/2023   Debility 06/03/2023   HFrEF (heart failure with reduced ejection fraction) ([name]) 06/01/2023   Neuropathy 05/28/2023   MSSA bacteremia 05/26/2023   Hyperkalemia 03/21/2023   Central line infection 03/10/2023   Pressure injury of skin 01/23/2023   Acute hypoxic respiratory failure ([name]) 01/21/2023   Atrial fibrillation ([name]) 01/21/2023   Acute on chronic hypoxic respiratory failure ([name]) 08/07/2022   Congestive heart failure ([name]) 08/05/2022   Elevated liver enzymes 06/08/2022   Multiple nodules of lung 05/31/2022   Sepsis ([name])    Advanced care planning/counseling discussion    Recurrent infections 09/30/2021   Reactive depression 09/02/2021   Hypervolemia 08/18/2021   Type 2 diabetes mellitus with diabetic chronic kidney disease ([name]) 06/26/2021   ESRD on dialysis [facility])    Medical non-compliance 04/21/2021   Hypotension 04/21/2021   ESRD (end stage renal disease) on dialysis ([name]) 04/16/2021   ESRD (end stage renal disease) ([name]) 04/12/2021   Paroxysmal atrial flutter ([name]) 04/12/2021   Cardiogenic shock ([name])    Chronic respiratory failure with hypoxia ([name]) 02/11/2021   Chronic systolic CHF (congestive heart failure) ([name])    Chronic cough    Morbid obesity ([name]) 02/26/2020   GERD without esophagitis 02/26/2020   OSA (obstructive sleep apnea) 02/26/2020   Hidradenitis suppurativa 02/26/2020    Patient's Medications  New Prescriptions   No medications on file  Previous Medications   APIXABAN (ELIQUIS) 5 MG TABS TABLET    Take 1 tablet (5 mg total) by mouth 2 (two) times daily.   CEFAZOLIN (ANCEF) 2-4 GM/100ML-% IVPB    Inject 100 mLs (2 g total) into the vein every Monday, Wednesday, and Friday with hemodialysis.   DARBEPOETIN ALFA (ARANESP) 150 MCG/0.3ML SOSY INJECTION    Inject 0.3 mLs (150 mcg total) into the skin every Saturday at 6 PM.   DIGOXIN (LANOXIN) 0.125  MG TABLET    Take 1 tablet (0.125 mg total) by mouth 3 (three) times a week.   GABAPENTIN (NEURONTIN) 300 MG CAPSULE    Take 300 mg by mouth daily. Mon., Wed, and Fri.   HYDROMORPHONE (DILAUDID) 2 MG TABLET    Take 0.5 tablets (1 mg total) by mouth every 4 (four) hours as needed for severe pain (pain score 7-10).   LANTHANUM (FOSRENOL) 1000 MG CHEWABLE TABLET    Chew 2,000 mg by mouth 3 (three) times daily with meals.   LEPTOSPERMUM MANUKA HONEY (MEDIHONEY) PSTE PASTE    Apply 1 Application topically daily.   LEVOTHYROXINE (SYNTHROID) 25 MCG TABLET    Take 1 tablet (25 mcg total) by mouth daily at 6 (six) AM.   MELATONIN 5 MG TABS    Take 1 tablet (5 mg total) by mouth at bedtime.   MIDODRINE (PROAMATINE) 10 MG TABLET    Take 3 tablets (30 mg total) by mouth 3 (three) times daily with meals.   NYSTATIN (GERHARDT'S BUTT CREAM) CREA    Apply 1 Application topically 3 (three) times daily.   ONDANSETRON (ZOFRAN) 4 MG/2ML SOLN INJECTION    Inject 2 mLs (4 mg total) into the vein every 6 (six) hours as needed for nausea or vomiting.  Modified Medications   No medications on file  Discontinued Medications   No medications on file    Subjective: ***   Review of Systems: ROS  Past Medical History:  Diagnosis Date   Acute  on chronic respiratory failure with hypoxia ([name]) 04/21/2021   Acute on chronic systolic (congestive) heart failure ([name]) 02/26/2020   Amiodarone toxicity    Anemia    Atrial flutter ([name])    Biventricular congestive heart failure ([name])    Chronic hypoxemic respiratory failure ([name])    Class 3 severe obesity due to excess calories with serious comorbidity and body mass index (BMI) of 50.0 to 59.9 in adult ([name]) 02/26/2020   Essential hypertension 02/26/2020   GERD without esophagitis 02/26/2020   Hidradenitis suppurativa 02/26/2020   NICM (nonischemic cardiomyopathy) ([name])    Obesity hypoventilation syndrome ([name])    OSA (obstructive sleep apnea)    PAF (paroxysmal  atrial fibrillation) ([name])    Pneumonia    Prediabetes 02/26/2020    Social History   Tobacco Use   Smoking status: Former    Current packs/day: 0.00    Types: Cigarettes    Quit date: 2019    Years since quitting: 5.8    Passive exposure: Current   Smokeless tobacco: Former   Tobacco comments:    quit in 2019  Vaping Use   Vaping status: Never Used  Substance Use Topics   Alcohol use: Never   Drug use: Never    Family History  Problem Relation Age of Onset   Heart disease Mother    Hypertension Mother    Pulmonary Hypertension Mother    Drug abuse Father        died due to Heroin overdose    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 There is no height or weight on file to calculate BMI.  Physical Exam  Lab Results Lab Results  Component Value Date   WBC 8.2 06/08/2023   HGB 11.0 (L) 06/08/2023   HCT 35.5 (L) 06/08/2023   MCV 98.6 06/08/2023   PLT 307 06/08/2023    Lab Results  Component Value Date   CREATININE 9.32 (H) 06/08/2023   BUN 25 (H) 06/08/2023   NA 134 (L) 06/08/2023   K 3.9 06/08/2023   CL 97 (L) 06/08/2023   CO2 20 (L) 06/08/2023     Lab Results  Component Value Date   ALT 26 05/18/2023   AST 41 05/18/2023   ALKPHOS 149 (H) 05/18/2023   BILITOT 0.7 05/18/2023    Lab Results  Component Value Date   TRIG 370 (H) 02/06/2023   No results found for: "LABRPR", "RPRTITER" No results found for: "HIV1RNAQUANT", "HIV1RNAVL", "CD4TABS"   Problem List Items Addressed This Visit   None  Assessment/Plan    Danelle Earthly, MD [facility] 06/09/2023, 8:41 AM

## 2023-06-09 NOTE — Progress Notes (Signed)
[facility] Progress Note   Subjective:   Patient up in wheelchair. Denies SOB, CP, dizziness, nausea. No new concerns.   Objective Vitals:   06/08/23 1848 06/08/23 2114 06/09/23 0500 06/09/23 0510  BP:  112/67 99/65   Pulse:  78 67   Resp:  17 16   Temp:  98.1 F (36.7 C) 98 F (36.7 C)   TempSrc:      SpO2:  100% 100%   Weight: (!) 153.7 kg   (!) 153.7 kg  Height:       Physical Exam General: Alert male in NAD Heart: RRR, no murmurs, rubs or gallops Lungs: CTA bilaterally, on O2 via [place] Abdomen: Soft, non-distended, +BS Extremities: No edema b/l lower extremities Dialysis Access: [facility] with intact bandage  Additional Objective Labs: Basic Metabolic Panel: Recent Labs  Lab 06/03/23 0419 06/06/23 1418 06/08/23 1116  NA 135 137 134*  K 4.0 3.9 3.9  CL 98 97* 97*  CO2 23 24 20*  GLUCOSE 98 97 105*  BUN 26* 29* 25*  CREATININE 9.38* 10.18* 9.32*  CALCIUM 10.4* 10.1 10.5*  PHOS 4.8* 4.6 4.5   Liver Function Tests: Recent Labs  Lab 06/03/23 0419 06/06/23 1418 06/08/23 1116  ALBUMIN 2.7* 2.8* 3.2*   No results for input(s): "LIPASE", "AMYLASE" in the last 168 hours. CBC: Recent Labs  Lab 06/03/23 0806 06/06/23 1418 06/08/23 1116  WBC 6.1 9.3 8.2  HGB 9.4* 10.3* 11.0*  HCT 30.7* 33.2* 35.5*  MCV 100.0 100.3* 98.6  PLT 349 345 307   Blood Culture    Component Value Date/Time   SDES BLOOD BLOOD RIGHT HAND 05/27/2023 0959   SPECREQUEST  05/27/2023 0959    BOTTLES DRAWN AEROBIC AND ANAEROBIC Blood Culture adequate volume   CULT  05/27/2023 0959    NO GROWTH 5 DAYS Performed at [facility], [address], [place], [place] [address]    REPTSTATUS 06/01/2023 FINAL 05/27/2023 1308    Cardiac Enzymes: No results for input(s): "CKTOTAL", "CKMB", "CKMBINDEX", "TROPONINI" in the last 168 hours. CBG: No results for input(s): "GLUCAP" in the last 168 hours. Iron Studies: No results for input(s): "IRON", "TIBC", "TRANSFERRIN",  "FERRITIN" in the last 72 hours. @lablastinr3 @ Studies/Results: No results found. Medications:  anticoagulant sodium citrate      ceFAZolin (ANCEF) IV Stopped (06/08/23 2002)    apixaban  5 mg Oral BID   Chlorhexidine Gluconate Cloth  6 each Topical Q12H   darbepoetin (ARANESP) injection - DIALYSIS  150 mcg Subcutaneous Q Sat-1800   digoxin  0.125 mg Oral Once per day on Monday Wednesday Friday   feeding supplement (NEPRO CARB STEADY)  237 mL Oral BID BM   gabapentin  300 mg Oral Q M,W,F-HD   Gerhardt's butt cream   Topical TID   lanthanum  2,000 mg Oral TID WC   leptospermum manuka honey  1 Application Topical Daily   levothyroxine  25 mcg Oral Q0600   melatonin  5 mg Oral QHS   midodrine  30 mg Oral TID WC   urea   Topical Daily    Dialysis Orders: MWF at [facility] 4.5h   400/800    172kg   2K/2Ca bath   TDC   No heparin (+ HIT)  Assessment/Plan: 1. Acute hypoxic respiratory failure: Remains on supplemental oxygen and ongoing efforts at continued ultrafiltration/lowering EDW with dialysis (anticipating that he has suffered weight loss during this hospitalization). He is stable at baseline O2 requirement 2. MSSA Bacteremia: TDC replaced  after he underwent line holiday 05/26/23-05/30/23.  Continue cefazolin IV 2. ESRD: On hemodialysis on a Monday/Wednesday/Friday schedule and utilizing sodium citrate for dialysis catheter lock given history of heparin-induced thrombocytopenia. 3. HTN/volume: He remains on midodrine 30mg  TID, still having some soft blood pressures, continue current dose and will reeval to see if we can decrease dose daily 4. Anemia: Hemoglobin/hematocrit at goal, continue high-dose ESA weekly. 5. Secondary hyperparathyroidism: Calcium level elevated and VDRA remains on hold, continue lanthanum for phosphorus binding/control. 6. Nutrition:  Continue renal diet and protein supplements  Rogers Blocker, PA-C 06/09/2023, 11:19 AM  [facility] Pager: [phone]

## 2023-06-09 NOTE — Progress Notes (Addendum)
Physical Therapy Session Note  Patient Details  Name: Phillip Young MRN: [id number] Date of Birth: 10-03-1985  Today's Date: 06/09/2023 PT Individual Time: 1053-1201 PT Individual Time Calculation (min): 68 min   Short Term Goals: Week 1:  PT Short Term Goal 1 (Week 1): Pt will perform bed mobility with overall supervision with no c/o dizziness and change in HR <20 bpm. PT Short Term Goal 2 (Week 1): Pt will perform sit<>stand with CGA and seat height at 90/90 hip/ knee angles. PT Short Term Goal 3 (Week 1): Pt will perform stand pivot transfers with CGA/ MinA using LRAD. PT Short Term Goal 4 (Week 1): pt will ambulate at least 20 ft using LRAD with overall CGA/ MinA.  Skilled Therapeutic Interventions/Progress Updates:    Pt received sitting in w/c and agreeable to therapy session. (Addendum: Received and maintained on 2L of O2 via nasal cannula throughout session.)  Pt reports he had a minor panic attack during earlier therapy session because he didn't have the wheelchair behind him and didn't feel safe without that security net due to him having significant fatigue and needing to sit quickly. Therapist provided emotional support and encouragement on working towards mobilizing without constant wheelchair follow to progress towards more functional mobility and pt agreeable.   Transported to/from gym in w/c for time management and energy conservation.  Sit>stand from w/c or slightly elevated EOM to bari-RW with CGA - pt relies heavily on B UE support to come to standing either via pushing on armrests or pushing up on RW. When coming to stand from lower mat, requires min A to rise to standing.  Donned R LE ankle DF assist ACE wrap.  Gait training 48ft + 60ft + 83ft +43ft using bari-RW with CGA/light min assist for safety/steadying - pt moves AD quickly/spontaneously and with limited control due to fear of falling. Pt demonstrating the following gait deviations with therapist providing the  described cuing and facilitation for improvement:   *still providing close w/c follow from +2 A but having goal of patient ambulating to target seat on EOM to work towards no longer needing w/c follow* - slow gait speed with prolonged time in double limb support  - frequent standing breaks to "reset" and calm his nerves due to pt reporting fear of falling during gait due to feeling "weak" - therapist providing calming breathing cues - reciprocal stepping pattern with good foot clearance using ACE wrap Therapist providing emotional support and encouragement for increased gait distance - pt using his personal music as motivation.  Reinforced education with pt on need for tennis shoes; however, will need more guidance on specific shoes to purchase.  Repeated sit<>stands x5 reps (with seated rest breaks) from elevated (25.5in) height mat with goal of limited hand support to focus on B LE strengthening - goal was to have patient hold ball with B UE support, but pt didn't feel safe/comfortable so held ball with 1 hand and used opposite UE support on RW for security (pt preference to use L UE support on RW) - CGA for safety and +2 for emotional support and SBA. Continued to provide emotional support and encouragement as pt not feeling confident in his ability to perform this task.  Standing with B UE support on RW, B LE alternating foot taps to 4" progressed to 6" high step 2 x4 reps each LE - relies heavily on B UE support and when cued to decrease support through arms, pt reports not feeling ready for this. Therapist  educated pt on option to use harness support to provide increased sense of security and progress to standing/ambulatory tasks with decreased reliance on B UE support.  Transported back to room. Short distance ~68ft ambulatory transfer w/c>EOB using bari-RW with CGA/light min A for steadying - pt continues to have quick, poorly controlled movements of the AD.   Sit>supine with min A for B LE  management onto bed and total A to doff shoes due to increased neuropathic pain from the tactile stimulus. Pt left supine in bed in the care of RN.    Therapy Documentation Precautions:  Precautions Precautions: Fall Precaution Comments: R sided weakness, R foot drop, O2 with activity, watch HR (has a-fib), Stage 3 wound on buttock Required Braces or Orthoses: Other Brace Other Brace: R foot-up brace, or CAM boot Restrictions Weight Bearing Restrictions: No   Pain: Continues to have neuropathic pain in B LEs, premedicated, provided emotional support and redirection for pain management. Reports sudden onset R ankle discomfort, unrated, during sit<>stand exercise; however, resolved with gentle PROM.    Therapy/Group: Individual Therapy  Ginny Forth , PT, DPT, NCS, CSRS 06/09/2023, 12:16 PM

## 2023-06-09 NOTE — Progress Notes (Signed)
Occupational Therapy Session Note  Patient Details  Name: Phillip Young MRN: [id number] Date of Birth: Jun 11, 1986  Today's Date: 06/09/2023 OT Individual Time: 4098-1191 OT Individual Time Calculation (min): 74 min    Short Term Goals: Week 1:  OT Short Term Goal 1 (Week 1): Pt will perform toilet and shower transfers with min a with LRAD OT Short Term Goal 2 (Week 1): Pt will complete LB bathing with LH sponge and min a OT Short Term Goal 3 (Week 1): Pt will stand at sink side for oral care for 2 min with min a  Skilled Therapeutic Interventions/Progress Updates:     Pt received semi-reclined in bed presenting to be mildly fatigued and discouraged, however receptive to skilled OT session reporting 0/10 pain at rest- OT offering intermittent rest breaks, repositioning, and therapeutic support to optimize participation in therapy session. When moving B LEs in bed for bed mobility and when donning shoes, Pt reporting significant pain in his feet 2/2 neuropathy and perseverating on pain throughout session. Pt requesting pain medications with Rn informed. MD in/out of [name] room for morning rounding- discussed plans to begin using an AFO with education provided on purpose and type of tennis shoe required for AFO. Also educated on sleeping in R foot brace to support improved ankle alignment and improved ROM with Pt receptive to education.  Pt transitioned to EOB using bed features with min A +increased time. Pt becoming tearful during bed mobility d/t pain in his feet and requested a couple of minutes to face time with his mom for emotional support with improved moral noted following.  Pt completed sit>stand from EOB with bed moderately elevated d/t Pt insisting on bed being elevated in order to stand stating "I just need this boost to get up" with min A B UEs support on RW. Education provided on sit<>stands technique and body mechanics with Pt receptive to education. Pt completed short distance  functional mobility ~5 ft to wc using RW with min A provided for balance and RW management. Positioned Pt at sink from grooming/hygiene tasks to improve self-efficacy and moral for participation in therapy session. Pt able to complete UB bathing, oral care, and groomed hair with set-up assist +increased time.  Engaged Pt in completing wc propulsion to day room >150 ft for endurance and UB strength training to improve functional use for BADLs. Pt able to complete task with close supervision min verbal cues provided for technique and 5 short rest breaks during task.  Pt completed sit>stand from wc using RW with light min A. Upon standing, Pt reporting light headedness and become very anxious. Returned to sitting position and vitals were assessed- HR 98 PSO2 100%. Engaged Pt in deep breathing PLB'ing and grounding exercises with noted improvement in anxiety symptoms. Pt then becoming tearful discussing psychosocial factors and impact of his medical diagnoses on his mental health with therapeutic support provided. Engaged Pt in positive self-talk exercises as Pt tends to be extremely hard on his self and speak negatively about his progress. Pt able to identify three things he is thankful for and three things he is proud of himself for with min A.  Engaged Pt in completing short distance functional mobility ~21ft using RW with +2 wc follow to decrease [name] anxiety in standing position. Pt able to complete task with light min A for balance and RW management with mod verbal cues for R foot step height.  Transported Pt back to room total A in wc for time management and  energy conservation. Pt was handed off to TR, [place], at end of session with all needs met.    Therapy Documentation Precautions:  Precautions Precautions: Fall Precaution Comments: R sided weakness, R foot drop, O2 with activity, watch HR (has a-fib), Stage 3 wound on buttock Required Braces or Orthoses: Other Brace Other Brace: R foot-up brace, or  CAM boot Restrictions Weight Bearing Restrictions: No   Therapy/Group: Individual Therapy  Clide Deutscher 06/09/2023, 8:28 AM

## 2023-06-10 DIAGNOSIS — N186 End stage renal disease: Secondary | ICD-10-CM | POA: Diagnosis not present

## 2023-06-10 DIAGNOSIS — F4323 Adjustment disorder with mixed anxiety and depressed mood: Secondary | ICD-10-CM | POA: Diagnosis not present

## 2023-06-10 DIAGNOSIS — R5381 Other malaise: Secondary | ICD-10-CM | POA: Diagnosis not present

## 2023-06-10 DIAGNOSIS — I5022 Chronic systolic (congestive) heart failure: Secondary | ICD-10-CM | POA: Diagnosis not present

## 2023-06-10 DIAGNOSIS — G5701 Lesion of sciatic nerve, right lower limb: Secondary | ICD-10-CM | POA: Diagnosis not present

## 2023-06-10 LAB — CBC
HCT: 36.1 % — ABNORMAL LOW (ref 39.0–52.0)
Hemoglobin: 11.1 g/dL — ABNORMAL LOW (ref 13.0–17.0)
MCH: 30.2 pg (ref 26.0–34.0)
MCHC: 30.7 g/dL (ref 30.0–36.0)
MCV: 98.1 fL (ref 80.0–100.0)
Platelets: 281 10*3/uL (ref 150–400)
RBC: 3.68 MIL/uL — ABNORMAL LOW (ref 4.22–5.81)
RDW: 15.3 % (ref 11.5–15.5)
WBC: 7.7 10*3/uL (ref 4.0–10.5)
nRBC: 0 % (ref 0.0–0.2)

## 2023-06-10 LAB — RENAL FUNCTION PANEL
Albumin: 3.1 g/dL — ABNORMAL LOW (ref 3.5–5.0)
Anion gap: 16 — ABNORMAL HIGH (ref 5–15)
BUN: 25 mg/dL — ABNORMAL HIGH (ref 6–20)
CO2: 24 mmol/L (ref 22–32)
Calcium: 10.4 mg/dL — ABNORMAL HIGH (ref 8.9–10.3)
Chloride: 94 mmol/L — ABNORMAL LOW (ref 98–111)
Creatinine, Ser: 8.62 mg/dL — ABNORMAL HIGH (ref 0.61–1.24)
GFR, Estimated: 7 mL/min — ABNORMAL LOW (ref >60)
Glucose, Bld: 105 mg/dL — ABNORMAL HIGH (ref 70–99)
Phosphorus: 4.7 mg/dL — ABNORMAL HIGH (ref 2.5–4.6)
Potassium: 4.2 mmol/L (ref 3.5–5.1)
Sodium: 134 mmol/L — ABNORMAL LOW (ref 135–145)

## 2023-06-10 MED ORDER — CHOLECALCIFEROL 10 MCG (400 UNIT) PO TABS
400.0000 [IU] | ORAL_TABLET | Freq: Every day | ORAL | Status: DC
  Administered 2023-06-11 – 2023-06-20 (×10): 400 [IU] via ORAL
  Filled 2023-06-10 (×11): qty 1

## 2023-06-10 MED ORDER — LIDOCAINE 4 % EX CREA: TOPICAL_CREAM | Freq: Four times a day (QID) | CUTANEOUS | Status: DC | PRN

## 2023-06-10 NOTE — Progress Notes (Signed)
We have attempt X3 to bring the patient to Hemodialysis and pt has refused at every attempt. RN hemodialysis notified floor RN that the patient will be coming on night shift. Transport went to go get the patient for Dialysis and patient refused. Bedside RN Amy Erkert try to convince the patient to come to Hemodialysis, but patient refused.

## 2023-06-10 NOTE — Consult Note (Signed)
Neuropsychological Consultation Comprehensive Inpatient Rehab   Patient:   Phillip Young   DOB:   1986-01-06  MR Number:  [id number]  Location:  [facility] [address] CENTER A [address] [place] [address] Dept: [phone] Loc: [phone]           Date of Service:   06/10/2023  Start Time:   10:15 AM End Time:   11:15 AM  Provider/Observer:  Arley Phenix, Psy.D.       Clinical Neuropsychologist       Billing Code/Service: [phone]  Reason for Service:    Phillip Young is a 37 year old male referred for neuropsychological consultation during his ongoing admission into the comprehensive inpatient rehabilitation unit.  The patient has a very complicated medical history with history of end-stage renal disease and had recently missed hemodialysis for 1 week because of transportation issues and being unable to get out of his house.  Patient has chronic hypoxic respiratory failure oxygen therapy of 2-5 L and has chronic history of vascular issues.  Patient has been followed by pulmonology, cardiology and now nephrology.  Patient had a recent extended hospitalization and then was discharged to [facility] over the summer.  He had been discharged and then went to skilled nursing rehab and was discharged on 10/18.  Patient had developed significant fever, malaise and hypotension and was worked up for sepsis and started on broad-spectrum antibiotics.  Patient with long hospitalization and now has been transferred over to the comprehensive inpatient rehabilitation unit.  Of 1 note is the patient's experiences including very vivid dreams and experiences during his intubation.  Patient notes that he has been making some gains on the comprehensive inpatient rehabilitation unit.  Patient is severely deconditioned.  During the visit today, the patient was awake and oriented and sitting in his wheelchair rather than his bed.  The  patient was more alert although there was at least 1 occasion where the patient became somnolent.  The patient's mother was on speaker phone without informing myself during our conversation with about 30 minutes transpiring without the patient's mother making herself known and the patient did not inform me of this.  The patient's mother later entered the conversation readdressing concerns and issues about perceived incidences on prior units that I have no direct knowledge 1 where the other about.  The patient was very appropriate and positive and we addressed a wide range of topics related to coping with loss of function and maintaining motivation going forward to make functional gains.  The patient describes motivation to continue and was able to identify specific areas that he had improved and particularly his capacity to move his arms more effectively even though there are continued significant limitations to his right arm functioning.  The patient was very appropriate interactions today as a whole.   HPI for the current admission:    HPI: Phillip Young is a 37 year old male presented to the emergency department 05/18/2023 complaining of leg swelling and pain.  He has a history of end-stage renal disease on chronic intermittent hemodialysis on Mondays, Wednesdays and Fridays.  He stated he had missed hemodialysis treatment for 1 week because of transportation issues and unable to get out of his house.  He has a history of chronic hypoxic respiratory failure chronic oxygen therapy of 2-5 L.  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 Shirlee Latch.  He is maintained on Eliquis.  He was admitted to [name] health internal medicine residency service.  Nephrology consulted for hemodialysis management.  Recently hospitalized over the summer with a prolonged hospital stay and discharged to  [facility].  He was then discharged to [facility] rehab on 10/02 and discharged from there on 10/18.  Developed fever to 103 on 10/29 accompanied by hypotension, malaise.  EKG consistent with atrial fibrillation with RVR.  Sepsis workup initiated and started on broad-spectrum antibiotics.  Cardiology consulted.  Transthoracic echocardiogram performed in July 2024 had an estimated EF of 20 to 25%.  Given gentle hydration and serial lactic acid determination.  Midodrine started 30 mg 3 times daily for blood pressure support.  Cardiology unable to utilize goal-directed medical therapy secondary to hypotension as well as his end-stage renal disease.  He is not a candidate for advanced therapies or ICD.  Tunneled dialysis catheter was removed 10/31 for line holiday.  Blood cultures positive for MSSA.  Echocardiogram updated 10/31 and left ventricular ejection fraction estimated at 30% with global hypokinesis.  ID consultation obtained on 11/01 and plan 6 weeks IV antibiotics. Patient is not a candidate for TEE per primary's discussion with Dr. Anne Fu given morbid obesity, 4 to 5 L oxygen requirement, on 30 mg 3 times daily midodrine, SBP. Pressure injury of left buttock addressed. TDC replaced 11/04.  Follow-up by cardiology 11/06.  The patient remains in atrial fibrillation with reasonably controlled rate on digoxin.  He is not a candidate for amiodarone or AV nodal ablation with PPM.  He has failed attempts at DCCV and will continue Eliquis.  Dr. Duke Salvia discussed with the patient that his heart rate elevation with exertion is due to deconditioning.  Continue digoxin.  Recommend following up within 1 month of discharge and continue to check digoxin levels at least every 3 months. Tolerating, but does not like renal diet.   Medical History:   Past Medical History:  Diagnosis Date   Acute on chronic respiratory failure with hypoxia ([name]) 04/21/2021   Acute on chronic systolic (congestive) heart  failure ([name]) 02/26/2020   Amiodarone toxicity    Anemia    Atrial flutter ([name])    Biventricular congestive heart failure ([name])    Chronic hypoxemic respiratory failure ([name])    Class 3 severe obesity due to excess calories with serious comorbidity and body mass index (BMI) of 50.0 to 59.9 in adult ([name]) 02/26/2020   Essential hypertension 02/26/2020   GERD without esophagitis 02/26/2020   Hidradenitis suppurativa 02/26/2020   NICM (nonischemic cardiomyopathy) ([name])    Obesity hypoventilation syndrome ([name])    OSA (obstructive sleep apnea)    PAF (paroxysmal atrial fibrillation) ([name])    Pneumonia    Prediabetes 02/26/2020         Patient Active Problem List   Diagnosis Date Noted   Adjustment disorder with mixed anxiety and depressed mood 06/10/2023   Metabolic encephalopathy 06/07/2023   Debility 06/03/2023   HFrEF (heart failure with reduced ejection fraction) ([name]) 06/01/2023   Neuropathy 05/28/2023   MSSA bacteremia 05/26/2023   Hyperkalemia 03/21/2023   Central line infection 03/10/2023   Pressure injury of skin 01/23/2023   Acute hypoxic respiratory failure ([name]) 01/21/2023   Atrial fibrillation ([name]) 01/21/2023   Acute on chronic hypoxic respiratory failure ([name]) 08/07/2022   Congestive heart failure ([name]) 08/05/2022   Elevated liver enzymes 06/08/2022   Multiple nodules of lung 05/31/2022   Sepsis ([name])  Advanced care planning/counseling discussion    Recurrent infections 09/30/2021   Reactive depression 09/02/2021   Hypervolemia 08/18/2021   Type 2 diabetes mellitus with diabetic chronic kidney disease ([name]) 06/26/2021   ESRD on dialysis [facility])    Medical non-compliance 04/21/2021   Hypotension 04/21/2021   ESRD (end stage renal disease) on dialysis ([name]) 04/16/2021   ESRD (end stage renal disease) ([name]) 04/12/2021   Paroxysmal atrial flutter ([name]) 04/12/2021   Cardiogenic shock ([name])    Chronic respiratory failure with hypoxia ([name]) 02/11/2021   Chronic  systolic CHF (congestive heart failure) ([name])    Chronic cough    Morbid obesity ([name]) 02/26/2020   GERD without esophagitis 02/26/2020   OSA (obstructive sleep apnea) 02/26/2020   Hidradenitis suppurativa 02/26/2020    Behavioral Observation/Mental Status:   Phillip Young  presents as a 37 y.o.-year-old Right handed African American Male who appeared his stated age. his dress was Appropriate and he was Well Groomed and his manners were Appropriate to the situation.  his participation was indicative of Appropriate behaviors.  There were physical disabilities noted.  he displayed an appropriate level of cooperation and motivation.    Interactions:    Active Appropriate and Redirectable  Attention:   within normal limits   Memory:   within normal limits; remote memory intact, recent memory impaired around events during his most acute critical illness  Visuo-spatial:   not examined  Speech (Volume):  low  Speech:   normal; slurred  Thought Process:  Coherent and Relevant  Coherent  Though Content:  WNL; not suicidal and not homicidal  Orientation:   person, place, time/date, and situation  Judgment:   Fair  Planning:   Fair  Affect:    Blunted and Flat  Mood:    Euthymic  Insight:   Good  Intelligence:   normal  Psychiatric History:  Patient with past history of reactive depressive events.  Family Med/Psych History:  Family History  Problem Relation Age of Onset   Heart disease Mother    Hypertension Mother    Pulmonary Hypertension Mother    Drug abuse Father        died due to Heroin overdose     Impression/DX:   Phillip Young is a 37 year old male referred for neuropsychological consultation during his ongoing admission into the comprehensive inpatient rehabilitation unit.  The patient has a very complicated medical history with history of end-stage renal disease and had recently missed hemodialysis for 1 week because of transportation issues and being unable  to get out of his house.  Patient has chronic hypoxic respiratory failure oxygen therapy of 2-5 L and has chronic history of vascular issues.  Patient has been followed by pulmonology, cardiology and now nephrology.  Patient had a recent extended hospitalization and then was discharged to [facility] over the summer.  He had been discharged and then went to skilled nursing rehab and was discharged on 10/18.  Patient had developed significant fever, malaise and hypotension and was worked up for sepsis and started on broad-spectrum antibiotics.  Patient with long hospitalization and now has been transferred over to the comprehensive inpatient rehabilitation unit.  Of 1 note is the patient's experiences including very vivid dreams and experiences during his intubation.  Patient notes that he has been making some gains on the comprehensive inpatient rehabilitation unit.  Patient is severely deconditioned.  During the visit today, the patient was awake and oriented and sitting in his wheelchair rather than his bed.  The patient was more alert although there was at least 1 occasion where the patient became somnolent.  The patient's mother was on speaker phone without informing myself during our conversation with about 30 minutes transpiring without the patient's mother making herself known and the patient did not inform me of this.  The patient's mother later entered the conversation readdressing concerns and issues about perceived incidences on prior units that I have no direct knowledge 1 where the other about.  The patient was very appropriate and positive and we addressed a wide range of topics related to coping with loss of function and maintaining motivation going forward to make functional gains.  The patient describes motivation to continue and was able to identify specific areas that he had improved and particularly his capacity to move his arms more effectively even though there are continued  significant limitations to his right arm functioning.  The patient was very appropriate interactions today as a whole.  Disposition/Plan:  Today we worked on coping and adjustment issues and I will follow-up with the patient  the first of next week if possible.          Electronically Signed   _______________________ Arley Phenix, Psy.D. Clinical Neuropsychologist

## 2023-06-10 NOTE — Plan of Care (Signed)
  Problem: Consults Goal: RH GENERAL PATIENT EDUCATION Description: See Patient Education module for education specifics. Outcome: Progressing   Problem: RH BOWEL ELIMINATION Goal: RH STG MANAGE BOWEL WITH ASSISTANCE Description: STG Manage Bowel with mod I Assistance. Outcome: Progressing Goal: RH STG MANAGE BOWEL W/MEDICATION W/ASSISTANCE Description: STG Manage Bowel with Medication with mod I  Assistance. Outcome: Progressing   Problem: RH SKIN INTEGRITY Goal: RH STG SKIN FREE OF INFECTION/BREAKDOWN Description: Manage skin with min assist Outcome: Progressing Goal: RH STG MAINTAIN SKIN INTEGRITY WITH ASSISTANCE Description: STG Maintain Skin Integrity With min Assistance. Outcome: Progressing Goal: RH STG ABLE TO PERFORM INCISION/WOUND CARE W/ASSISTANCE Description: STG Able To Perform Incision/Wound Care With min  Assistance. Outcome: Progressing   Problem: RH SAFETY Goal: RH STG ADHERE TO SAFETY PRECAUTIONS W/ASSISTANCE/DEVICE Description: STG Adhere to Safety Precautions With cues  Assistance/Device. Outcome: Progressing   Problem: RH PAIN MANAGEMENT Goal: RH STG PAIN MANAGED AT OR BELOW [name] PAIN GOAL Description: < 4 with prns Outcome: Progressing   Problem: RH KNOWLEDGE DEFICIT GENERAL Goal: RH STG INCREASE KNOWLEDGE OF SELF CARE AFTER HOSPITALIZATION Description: Patient and mother will be able to manage care at discharge with medications and dietary modifications using educational resources independently Outcome: Progressing   Problem: Education: Goal: Knowledge of General Education information will improve Description: Including pain rating scale, medication(s)/side effects and non-pharmacologic comfort measures Outcome: Progressing   Problem: Health Behavior/Discharge Planning: Goal: Ability to manage health-related needs will improve Outcome: Progressing   Problem: Clinical Measurements: Goal: Ability to maintain clinical measurements within normal  limits will improve Outcome: Progressing Goal: Will remain free from infection Outcome: Progressing Goal: Diagnostic test results will improve Outcome: Progressing Goal: Respiratory complications will improve Outcome: Progressing Goal: Cardiovascular complication will be avoided Outcome: Progressing   Problem: Activity: Goal: Risk for activity intolerance will decrease Outcome: Progressing   Problem: Nutrition: Goal: Adequate nutrition will be maintained Outcome: Progressing   Problem: Coping: Goal: Level of anxiety will decrease Outcome: Progressing   Problem: Elimination: Goal: Will not experience complications related to bowel motility Outcome: Progressing Goal: Will not experience complications related to urinary retention Outcome: Progressing   Problem: Pain Management: Goal: General experience of comfort will improve Outcome: Progressing   Problem: Safety: Goal: Ability to remain free from injury will improve Outcome: Progressing   Problem: Skin Integrity: Goal: Risk for impaired skin integrity will decrease Outcome: Progressing

## 2023-06-10 NOTE — Progress Notes (Signed)
Patient refused to go to dialysis x2.  This RN, and charge RN tried to talk to him about the importance of the treatment and risks of not going, he continued to refuse to go.  MD messaged

## 2023-06-10 NOTE — Progress Notes (Addendum)
Nurse Amy Teresa Coombs RN says patient does not want to come to dialysis this afternoon.  PA Zeyfang informed me that he talked to him bedside and patient prefers to do dialysis on Night shift.  Charge nurse Aggie Cosier informed.  Stacie Glaze [id number]-KDU

## 2023-06-10 NOTE — Progress Notes (Signed)
Occupational Therapy Session Note  Patient Details  Name: Phillip Young MRN: [id number] Date of Birth: July 13, 1986  Today's Date: 06/10/2023 OT Individual Time: 3244-0102 OT Individual Time Calculation (min): 79 min    Short Term Goals: Week 1:  OT Short Term Goal 1 (Week 1): Pt will perform toilet and shower transfers with min a with LRAD OT Short Term Goal 2 (Week 1): Pt will complete LB bathing with LH sponge and min a OT Short Term Goal 3 (Week 1): Pt will stand at sink side for oral care for 2 min with min a  Skilled Therapeutic Interventions/Progress Updates:  Pt greeted supine in bed, pt agreeable to OT intervention.    Pt requested to shower, HD port and IV covered.   Transfers/bed mobility: pt completed supine>sit with heavy use of bed features with CGA. Pt completed sit>stand from heavily elevated EOB with supervision, pt reports hospital bed at home. Pt able to walk into bathroom today! With Rw and CGA. + time needed on toilet to void bowels.   ADLs:  LB dressing: donned pants with MODA to thread BLEs, left pants down as nurse needing to change buttock dressing.  Footwear: total A to don shoes   Bathing: pt completed bathing seated on shower seat with supervision.  Pt utilized LH sponge as needed for LB bathing Transfers: pt completed ambulatory transfers to toilet and shower with RW and CGA Toileting: pt completed 3/3 toileting tasks with supervision able to complete posterior pericare from standing after BM, continent bowel/urine void.    Ended session with pt seated in w/c, nurse present to change buttock wounds.         Therapy Documentation Precautions:  Precautions Precautions: Fall Precaution Comments: R sided weakness, R foot drop, O2 with activity, watch HR (has a-fib), Stage 3 wound on buttock Required Braces or Orthoses: Other Brace Other Brace: R foot-up brace, or CAM boot Restrictions Weight Bearing Restrictions: No  Pain: unrated pain reported in BLEs  and feet, rest breaks and repositioning utilized as needed. Pt also prefers pillow to be placed under feet when sitting EOB, therefore provided this support for comfort.     Therapy/Group: Individual Therapy  Barron Schmid 06/10/2023, 12:10 PM

## 2023-06-10 NOTE — Progress Notes (Signed)
Occupational Therapy Weekly Progress Note  Patient Details  Name: Phillip Young MRN: [id number] Date of Birth: [birth year]  Beginning of progress report period: June 04, 2023 End of progress report period: June 10, 2023  Today's Date: 06/10/2023 OT Individual Time: 1105-1200 OT Individual Time Calculation (min): 55 min    Patient has met 3 of 3 short term goals. Phillip Young is steadily progressing towards reaching his LTGs and is presenting to be more motivated to participate in therapy sessions. Pt is able to complete UB dressing supervision, LB dressing mod A, UB bathing at shower level supervision while seated on TTB, and LB bathing min A to wash his bottom utilizing long handled sponge to wash his feet. He is completing stand pivot transfers using RW with CGA to light min A for RW management.   Patient continues to demonstrate the following deficits: muscle weakness, decreased cardiorespiratoy endurance, central origin, and decreased standing balance and decreased balance strategies and therefore will continue to benefit from skilled OT intervention to enhance overall performance with BADL and Reduce care partner burden.  Patient progressing toward long term goals..  Continue plan of care.  OT Short Term Goals Week 1:  OT Short Term Goal 1 (Week 1): Pt will perform toilet and shower transfers with min a with LRAD OT Short Term Goal 1 - Progress (Week 1): Met OT Short Term Goal 2 (Week 1): Pt will complete LB bathing with LH sponge and min a OT Short Term Goal 2 - Progress (Week 1): Met OT Short Term Goal 3 (Week 1): Pt will stand at sink side for oral care for 2 min with min a OT Short Term Goal 3 - Progress (Week 1): Met Week 2:  OT Short Term Goal 1 (Week 2): Pt will complete 3/3 toileting tasks wiht CGA using LRAD OT Short Term Goal 2 (Week 2): Pt will maintain standing balance during functional tasks >3 minutes OT Short Term Goal 3 (Week 2): Pt will complete LB dressing min A  using AD and LRAD PRN  Skilled Therapeutic Interventions/Progress Updates:     Pt received siting up in wc finishing up his neuro-psychology session. Pt presenting to be in an emotional state, however receptive to skilled OT session reporting 7/10 pain- OT offering intermittent rest breaks, repositioning, and therapeutic support to optimize participation in therapy session. Pt received pain medication prior to therapy session. Pt maintained on 2L O2 throughout session. Transported Pt to outdoor location for therapy session to support improved moral and change in environment. Pt extremely thankful to be outside and repeatedly stating "this is such a treat". While outside, Pt became very emotional sharing stories about his hospital stay, discussing psychosocial factors causing his discouragement, and impact of prolonged hospitalization on his mental health. Focused session on using therapeutic use of self to encourage patient and provide therapeutic support. Educated Pt on coping strategies for stress and anger management with Pt receptive to education and able to verbalize his own learned strategies. Pt then requesting food from [employer] in hospital. Engaged Pt in completing short distance functional mobility ~3 ft to front counter to order food with Pt able to maintain balance with CGA using RW. Pt then stood to order and pay for food to support improve community re-integration skills. Transported Pt back to room total A in wc. Pt was left resting in wc with call bell in reach, seatbelt alarm on, and all needs met.    Therapy Documentation Precautions:  Precautions Precautions: Fall Precaution  Comments: R sided weakness, R foot drop, O2 with activity, watch HR (has a-fib), Stage 3 wound on buttock Required Braces or Orthoses: Other Brace Other Brace: R foot-up brace, or CAM boot Restrictions Weight Bearing Restrictions: No   Therapy/Group: Individual Therapy  Phillip Young 06/10/2023, 12:19  PM

## 2023-06-10 NOTE — Progress Notes (Signed)
Physical Therapy Session Note  Patient Details  Name: Phillip Young MRN: [id number] Date of Birth: [birth year]  Today's Date: 06/10/2023 PT Individual Time: 0905-1005 PT Individual Time Calculation (min): 60 min   Short Term Goals: Week 1:  PT Short Term Goal 1 (Week 1): Pt will perform bed mobility with overall supervision with no c/o dizziness and change in HR <20 bpm. PT Short Term Goal 2 (Week 1): Pt will perform sit<>stand with CGA and seat height at 90/90 hip/ knee angles. PT Short Term Goal 3 (Week 1): Pt will perform stand pivot transfers with CGA/ MinA using LRAD. PT Short Term Goal 4 (Week 1): pt will ambulate at least 20 ft using LRAD with overall CGA/ MinA.  Skilled Therapeutic Interventions/Progress Updates:    Pt received in [facility], alarm activated, agreeable to PT.  Time spent for pt to chat with MD about side affects he's was experiencing from the muscle relaxer he took for the first time last night.  Time spent for pt to finishing eating his peaches independently as well as donning lotion on his face.  STS: multiple from WC requiring minA, BUE to assist from [facility] armrests. During transition pt demo forward push with arms to bring buttock off mat and remain in ~ 90 degrees trunk flexion then stands up straight after transitioning arms to RW, segmental trunk movement rather than fluid one motion.  Gait training: R DF assist ace wrap -30 ft +30 ft --- lite gait w/ moderate body weight assistance Pt demo extreme anxiety being in lite gait despite reassurance that he would not be able to fall out of the harness. While in lite gait pt was unable to stand up through legs or use BUE on handle bars to remain upright. Pt demo increased forward trunk flexion & seated position in sling while trying to ambulate. Pt became tearful and shaking but reported he wanted to continue on to targeted goal. During rest break Pt reports he liked the concept of the lite gait, was confident he would not  fall through the harness, and wanted to try again for a second trial. Pt demo same anxiety and fear during second trial. Pt was educated about the purpose of the lite gait and that therapy did not want to complete things that gave him crippling anxiety. Pt reported he experienced this extreme anxiety when he tried the RW for the first few times. Pt continued to demo shuffled gait pattern and no longer experienced heel to toe action on the R side d/t sitting posture.  Pt benefits from increased positive feedback, reassurance , and encouragement through the session. Pt reports he is appreciative of therapies help and wishes he was not as emotional as he is.  Pt dependently transported back to room d/t fatigue.  Pt left in WC, alarm activated, all needs met.  Therapy Documentation Precautions:  Precautions Precautions: Fall Precaution Comments: R sided weakness, R foot drop, O2 with activity, watch HR (has a-fib), Stage 3 wound on buttock Required Braces or Orthoses: Other Brace Other Brace: R foot-up brace, or CAM boot Restrictions Weight Bearing Restrictions: No Pain: Pt reports 9/10 neuropathic pain during session. See medication for pain management.  Therapy/Group: Individual Therapy  TJADEN BLAMER 06/10/2023, 7:58 AM

## 2023-06-10 NOTE — Progress Notes (Signed)
Subjective: Just back from physical therapy, wanting to wait for dialysis tonight.  Denies shortness of breath  Objective Vital signs in last 24 hours: Vitals:   06/09/23 2027 06/10/23 0545 06/10/23 0936 06/10/23 1321  BP: (!) 127/57 97/68  111/79  Pulse: 66 (!) 52 96 83  Resp: 17 17  16   Temp: 98.5 F (36.9 C) 98.5 F (36.9 C)  (!) 97.5 F (36.4 C)  TempSrc: Oral   Oral  SpO2: 98% 100%  99%  Weight:  (!) 153.6 kg    Height:       Weight change: -2.2 kg  Physical Exam: General: Alert obese male NAD Heart: RRR no MRG Lungs: CTA bilaterally nonlabored breathing Abdomen: Obese NABS soft NT ND Extremities: Bipedal edema Dialysis Access: TDC with intact bandage  OP  dialysis Orders: MWF at [facility] 4.5h   400/800    172kg   2K/2Ca bath   TDC   No heparin (+ HIT)   Assessment/Plan: 1. Acute hypoxic respiratory failure: Remains on supplemental oxygen and ongoing efforts at continued ultrafiltration/lowering EDW with dialysis (anticipating that he has suffered weight loss during this hospitalization). He is stable at baseline O2 requirement 2. MSSA Bacteremia: TDC replaced after he underwent line holiday 05/26/23-05/30/23.  Continue cefazolin IV 2. ESRD: HD MWF schedule and utilizing sodium citrate for dialysis catheter lock given history of heparin-induced thrombocytopenia. 3. HTN/volume: He remains on midodrine 30mg  TID, still having some soft blood pressures, continue current dose and will reeval to see if we can decrease dose daily 4. Anemia: Hgb at goal, continue high-dose ESA weekly. 5. Secondary hyperparathyroidism: Calcium level elevated and VDRA remains on hold, continue lanthanum for phosphorus binding/control. 6. Nutrition:  Continue renal diet and protein supplements 7.  Deconditioning right foot drop= rehab  with CIR   Lenny Pastel, PA-C [facility] [phone] 06/10/2023,2:10 PM  LOS: 7 days   Labs: Basic Metabolic Panel: Recent Labs  Lab  06/06/23 1418 06/08/23 1116  NA 137 134*  K 3.9 3.9  CL 97* 97*  CO2 24 20*  GLUCOSE 97 105*  BUN 29* 25*  CREATININE 10.18* 9.32*  CALCIUM 10.1 10.5*  PHOS 4.6 4.5   Liver Function Tests: Recent Labs  Lab 06/06/23 1418 06/08/23 1116  ALBUMIN 2.8* 3.2*   No results for input(s): "LIPASE", "AMYLASE" in the last 168 hours. No results for input(s): "AMMONIA" in the last 168 hours. CBC: Recent Labs  Lab 06/06/23 1418 06/08/23 1116  WBC 9.3 8.2  HGB 10.3* 11.0*  HCT 33.2* 35.5*  MCV 100.3* 98.6  PLT 345 307   Cardiac Enzymes: No results for input(s): "CKTOTAL", "CKMB", "CKMBINDEX", "TROPONINI" in the last 168 hours. CBG: No results for input(s): "GLUCAP" in the last 168 hours.  Studies/Results: No results found. Medications:  anticoagulant sodium citrate      ceFAZolin (ANCEF) IV Stopped (06/08/23 2002)    apixaban  5 mg Oral BID   Chlorhexidine Gluconate Cloth  6 each Topical Q12H   Chlorhexidine Gluconate Cloth  6 each Topical Q0600   cholecalciferol  400 Units Oral Daily   darbepoetin (ARANESP) injection - DIALYSIS  150 mcg Subcutaneous Q Sat-1800   digoxin  0.125 mg Oral Once per day on Monday Wednesday Friday   feeding supplement (NEPRO CARB STEADY)  237 mL Oral BID BM   gabapentin  300 mg Oral Q M,W,F-HD   Gerhardt's butt cream   Topical TID   lanthanum  2,000 mg Oral TID WC   leptospermum  manuka honey  1 Application Topical Daily   levothyroxine  25 mcg Oral Q0600   melatonin  5 mg Oral QHS   midodrine  30 mg Oral TID WC

## 2023-06-10 NOTE — Progress Notes (Signed)
Patient refused to go to dialysis. Educated patient about the need to go when scheduled and possible effects of refusal. Stated he would not go at this time. PA notified.

## 2023-06-10 NOTE — Progress Notes (Addendum)
PROGRESS NOTE   Subjective/Complaints:  No issues overnite , c/o B foot pain chronic, left is worse than right , right side is numb since pressure associated sciatic nerve injury   Discussed wound care with RN , may cont gauze packing and Medihoney  to Right buttocks wound   ROS- neg for fevers, chills, CP, SOB, N/V/D + Pain in distal lower extremities  Objective:   No results found. Recent Labs    06/08/23 1116  WBC 8.2  HGB 11.0*  HCT 35.5*  PLT 307    Recent Labs    06/08/23 1116  NA 134*  K 3.9  CL 97*  CO2 20*  GLUCOSE 105*  BUN 25*  CREATININE 9.32*  CALCIUM 10.5*     Intake/Output Summary (Last 24 hours) at 06/10/2023 0841 Last data filed at 06/09/2023 1748 Gross per 24 hour  Intake 480 ml  Output --  Net 480 ml     Pressure Injury 05/18/23 Buttocks Right Stage 3 -  Full thickness tissue loss. Subcutaneous fat may be visible but bone, tendon or muscle are NOT exposed. (Active)  05/18/23 2300  Location: Buttocks  Location Orientation: Right  Staging: Stage 3 -  Full thickness tissue loss. Subcutaneous fat may be visible but bone, tendon or muscle are NOT exposed.  Wound Description (Comments):   Present on Admission: Yes (original date 05/18/23; present on admit to CIR 06/03/23)     Pressure Injury 05/18/23 Buttocks Left Stage 2 -  Partial thickness loss of dermis presenting as a shallow open injury with a red, pink wound bed without slough. (Active)  05/18/23 2300  Location: Buttocks  Location Orientation: Left  Staging: Stage 2 -  Partial thickness loss of dermis presenting as a shallow open injury with a red, pink wound bed without slough.  Wound Description (Comments):   Present on Admission: Yes (original date 05/18/23; present on admit to CIR 06/03/23)    Physical Exam: Vital Signs Blood pressure 97/68, pulse (!) 52, temperature 98.5 F (36.9 C), resp. rate 17, height 6' (1.829 m),  weight (!) 153.6 kg, SpO2 100%.  General: No acute distress, sitting in wheelchair Patient is a little anxious this morning, pleasant overall Heart: Regular rate and rhythm no rubs murmurs or extra sounds Lungs: Clear to auscultation, breathing unlabored, no rales or wheezes Abdomen: Positive bowel sounds, soft nontender to palpation, nondistended Extremities: No clubbing, cyanosis, or edema Skin: No evidence of breakdown, no evidence of rash Right gluteal , probed with Q tip 3cm , no foul smell -not examined today  Left hip  Neurologic: Cranial nerves II through XII intact, motor strength is 5/5 in bilateral deltoid, bicep, tricep, grip, 0/5 right  ankle dorsiflexor and plantar flexor 5/5 RIght quad 4/5 R hamstring, 5/5 left quad and hamstring and ankle  Sensory exam absent LT sensation RIght foot/ankle , no proprio Right toes , partial proprio at R ankle ,   Left foot intact sensation LT   Musculoskeletal: Full range of motion in all 4 extremities. No joint swelling  Assessment/Plan: 1. Functional deficits which require 3+ hours per day of interdisciplinary therapy in a comprehensive inpatient rehab setting. Physiatrist is providing close  team supervision and 24 hour management of active medical problems listed below. Physiatrist and rehab team continue to assess barriers to discharge/monitor patient progress toward functional and medical goals  Care Tool:  Bathing    Body parts bathed by patient: Right arm, Left arm, Chest, Abdomen, Front perineal area, Face   Body parts bathed by helper: Left lower leg, Right lower leg, Left upper leg, Buttocks, Right upper leg     Bathing assist Assist Level: Moderate Assistance - Patient 50 - 74%     Upper Body Dressing/Undressing Upper body dressing   What is the patient wearing?: Hospital gown only    Upper body assist Assist Level: Minimal Assistance - Patient > 75%    Lower Body Dressing/Undressing Lower body dressing      What  is the patient wearing?: Underwear/pull up, Hospital gown only     Lower body assist Assist for lower body dressing: Maximal Assistance - Patient 25 - 49%     Toileting Toileting    Toileting assist Assist for toileting: Maximal Assistance - Patient 25 - 49%     Transfers Chair/bed transfer  Transfers assist     Chair/bed transfer assist level: Moderate Assistance - Patient 50 - 74%     Locomotion Ambulation   Ambulation assist      Assist level: Minimal Assistance - Patient > 75% Assistive device: Walker-rolling Max distance: 8 ft   Walk 10 feet activity   Assist  Walk 10 feet activity did not occur: Safety/medical concerns        Walk 50 feet activity   Assist Walk 50 feet with 2 turns activity did not occur: Safety/medical concerns         Walk 150 feet activity   Assist Walk 150 feet activity did not occur: Safety/medical concerns         Walk 10 feet on uneven surface  activity   Assist Walk 10 feet on uneven surfaces activity did not occur: Safety/medical concerns         Wheelchair     Assist Is the patient using a wheelchair?: Yes Type of Wheelchair: Manual    Wheelchair assist level: Dependent - Patient 0% Max wheelchair distance: 400 ft    Wheelchair 50 feet with 2 turns activity    Assist        Assist Level: Dependent - Patient 0%   Wheelchair 150 feet activity     Assist      Assist Level: Dependent - Patient 0%   Blood pressure 97/68, pulse (!) 52, temperature 98.5 F (36.9 C), resp. rate 17, height 6' (1.829 m), weight (!) 153.6 kg, SpO2 100%.  Medical Problem List and Plan: 1. Functional deficits secondary to debility from MSSA bacteremia, c/b multiple medical comorbidities             -patient may shower with buttocks wound and lines covered             -ELOS/Goals: 11/26 , supervision PT/OT,   2.  Antithrombotics: -DVT/anticoagulation:  Pharmaceutical: Eliquis 5 mg BID              -antiplatelet therapy: none   3. Pain Management: Tylenol as needed             -PO Dilaudid initiated on acute side due to nausea with oxycodone--> On admission patient requests to switch back, changed back to oxycodone 15 mg Q4H PRN and Dced Dilaudid, Has only taken 4  x oxy IR 15mg  tabs  in the last 48h 11/12              - For neuropathic pain gabapentin 300 mg MWF- will add lidocaine cream, discussed pregabalin which pt declines at this time                4. Mood/Behavior/Sleep: LCSW to evaluate and provide emotional support             -continue melatonin 5 mg q HS             -antipsychotic agents: n/a          -11/11 Start alprazolam 0.25mg  PRN BID for anxiety  continues to voice concerns about his acute care stay , will have neuropsych followup today 5. Neuropsych/cognition: This patient is capable of making decisions on his own behalf.              - Would benefit from neuropsych consult due to above   6. Skin/Wound Care: Routine skin care checks             -pressure injury left buttock/posterior thigh>>continue wound care              - Offered LAL mattress given HX pressure wounds; patient declines- RIght buttocks wound has been healing well with standard mattress   7. Fluids/Electrolytes/Nutrition: Routine Is and Os and follow-up chemistries -Renal diet with 1200 cc fluid restriction             -Continue Fosrenol 2000 mg 3 times daily with meals   8: Hypotension: monitor TID and prn             -continue midodrine 30 mg 3 times daily with meals Encourage patient to discuss dosing with nephrology. No current orthostatic drops on current dose    Vitals:   06/09/23 2027 06/10/23 0545  BP: (!) 127/57 97/68  Pulse: 66 (!) 52  Resp: 17 17  Temp: 98.5 F (36.9 C) 98.5 F (36.9 C)  SpO2: 98% 100%   On M-W-F digoxin    9: pAF: on Eliquis              -unable to tolerate amiodarone (requires midodrine for BP so not a candidate for nodal agents)   10: Hypothyroidism:  Continue Synthroid 25 mcg daily   11: Chronic systolic heart failure: EF ~30-35%             -continue digoxin 0.125 mg  3 x weekly             -daily weight             -volume status>>HD 3 x weekly   12: Chronic hypoxic respiratory failure: on home O2 5 L via    13: LE neuropathy/ R foot drop: CAM boot              - Pattern of weakness and sensory loss consistent with sciatic neuropathy above the knee (mild weakness  hamstrings); likely compressive from ICU stay  - hopeful for improvement and likely will need EMG as OP -11/11 Continue TED hoses patient reports this is helping his neuropathic pain.  Gabapentin dose limited by renal function pt feels dosing after HD is more helpful .  Could consider trying Lyrica instead of gabapentin if pain worsens   14: Morbid Obesity: Body mass index is 45.93 kg/m.    15: Anemia: on ESP   16: History or HIT positivity>>no heparin   17: MSSA bacteremia: will continue Ancef 2  g with HD treatment 3x/week (through 12/09)   18. Vocal hoarseness s/p extubation. Consider ENT evaluation if no improvement.   19.  ESRD on HD- nephro to manage     20. Insomnia  -Will schedule melatonin 5mg   LOS: 7 days A FACE TO FACE EVALUATION WAS PERFORMED  Erick Colace 06/10/2023, 8:41 AM

## 2023-06-11 DIAGNOSIS — R5381 Other malaise: Secondary | ICD-10-CM | POA: Diagnosis not present

## 2023-06-11 DIAGNOSIS — G6281 Critical illness polyneuropathy: Secondary | ICD-10-CM

## 2023-06-11 DIAGNOSIS — E861 Hypovolemia: Secondary | ICD-10-CM | POA: Diagnosis not present

## 2023-06-11 DIAGNOSIS — E8779 Other fluid overload: Secondary | ICD-10-CM | POA: Diagnosis not present

## 2023-06-11 LAB — CBC WITH DIFFERENTIAL/PLATELET
Abs Immature Granulocytes: 0 10*3/uL (ref 0.00–0.07)
Basophils Absolute: 0.1 10*3/uL (ref 0.0–0.1)
Basophils Relative: 2 %
Eosinophils Absolute: 0.7 10*3/uL — ABNORMAL HIGH (ref 0.0–0.5)
Eosinophils Relative: 10 %
HCT: 32.1 % — ABNORMAL LOW (ref 39.0–52.0)
Hemoglobin: 10.1 g/dL — ABNORMAL LOW (ref 13.0–17.0)
Immature Granulocytes: 0 %
Lymphocytes Relative: 20 %
Lymphs Abs: 1.4 10*3/uL (ref 0.7–4.0)
MCH: 30.8 pg (ref 26.0–34.0)
MCHC: 31.5 g/dL (ref 30.0–36.0)
MCV: 97.9 fL (ref 80.0–100.0)
Monocytes Absolute: 0.7 10*3/uL (ref 0.1–1.0)
Monocytes Relative: 11 %
Neutro Abs: 4 10*3/uL (ref 1.7–7.7)
Neutrophils Relative %: 57 %
Platelets: 247 10*3/uL (ref 150–400)
RBC: 3.28 MIL/uL — ABNORMAL LOW (ref 4.22–5.81)
RDW: 15.3 % (ref 11.5–15.5)
WBC: 7 10*3/uL (ref 4.0–10.5)
nRBC: 0 % (ref 0.0–0.2)

## 2023-06-11 LAB — BASIC METABOLIC PANEL
Anion gap: 15 (ref 5–15)
BUN: 35 mg/dL — ABNORMAL HIGH (ref 6–20)
CO2: 22 mmol/L (ref 22–32)
Calcium: 10 mg/dL (ref 8.9–10.3)
Chloride: 98 mmol/L (ref 98–111)
Creatinine, Ser: 10.18 mg/dL — ABNORMAL HIGH (ref 0.61–1.24)
GFR, Estimated: 6 mL/min — ABNORMAL LOW (ref >60)
Glucose, Bld: 105 mg/dL — ABNORMAL HIGH (ref 70–99)
Potassium: 4 mmol/L (ref 3.5–5.1)
Sodium: 135 mmol/L (ref 135–145)

## 2023-06-11 MED ORDER — DARBEPOETIN ALFA 60 MCG/0.3ML IJ SOSY
60.0000 ug | PREFILLED_SYRINGE | INTRAMUSCULAR | Status: DC
  Administered 2023-06-11 – 2023-06-18 (×2): 60 ug via SUBCUTANEOUS
  Filled 2023-06-11 (×2): qty 0.3

## 2023-06-11 MED ORDER — DARBEPOETIN ALFA 60 MCG/0.3ML IJ SOSY
60.0000 ug | PREFILLED_SYRINGE | INTRAMUSCULAR | Status: DC
Start: 2023-06-18 — End: 2023-06-11

## 2023-06-11 MED ORDER — ANTICOAGULANT SODIUM CITRATE 4% (200MG/5ML) IV SOLN
5.0000 mL | Freq: Once | Status: AC
  Administered 2023-06-11: 3.8 mL
  Filled 2023-06-11: qty 5

## 2023-06-11 NOTE — Progress Notes (Signed)
   06/11/23 1730  Vitals  Temp  (pt refused to have his temp taken---)  Resp (!) 26  BP 111/67  SpO2 100 %  O2 Device Nasal Cannula  Weight  (unable to weigh--pt refused to allow [place] to move his blankets for accuracy)  Type of Weight Post-Dialysis  Oxygen Therapy  O2 Flow Rate (L/min) 4 L/min  Patient Activity (if Appropriate) In bed  Pulse Oximetry Type Continuous  Oximetry Probe Site Changed No  Post Treatment  Dialyzer Clearance Lightly streaked  Hemodialysis Intake (mL) 0 mL  Liters Processed 58.6  Fluid Removed (mL) 900 mL  Tolerated HD Treatment No (Comment)  Post-Hemodialysis Comments see progress notes   Received patient in bed to unit.  Alert and oriented.  Informed consent signed and in chart.   TX duration:2 hours and 29 minutes out of 4 hours    Transported back to the room  Alert, without acute distress.  Hand-off given to patient's nurse.   Access used: [email or web address] Access issues: no complications Total UF removed: 900cc Medication(s) given: none   Pt signed off AMA after 2 hours and 29 minutes--states "hemodialysis is washing out my gabepentin--I have neuropathy"----when asked if he would like to have something for pain--states" no-just cancel my treatment"---when attempting to prepare to terminate the treatment--his cranbeery juice cup was thrown out---had finished----pt states " I dont want u to touch me because u threw my juice away---Theresa RN took the pt off--when she attempted to change the dressing that is due today--she asked the pt to help hold his tshirt up to assist with the dressing--states"I dont want u to touch me either"---pt refused toget a weight--a temp----report called to the primary RN---nephrology to be notified     [facility]

## 2023-06-11 NOTE — Progress Notes (Signed)
Subjective: Seen in room no current complaints, refused dialysis yesterday told me he would go to the evening HD then refused said needed to see his family.  Stressed to him importance need for HD on schedule agrees HD today  Objective Vital signs in last 24 hours: Vitals:   06/10/23 0936 06/10/23 1321 06/10/23 2229 06/11/23 0524  BP:  111/79 105/67 (!) 106/55  Pulse: 96 83 (!) 50 77  Resp:  16 18 19   Temp:  (!) 97.5 F (36.4 C) (!) 97.5 F (36.4 C) 97.8 F (36.6 C)  TempSrc:  Oral Oral   SpO2:  99% 100% 100%  Weight:      Height:       Weight change:   Physical Exam: General: Alert obese male NAD Heart: RRR no MRG Lungs: CTA bilaterally nonlabored breathing Abdomen: Obese NABS soft NT ND Extremities: Bipedal edema  Dialysis Access: TDC with intact bandage   OP  dialysis Orders: MWF at [facility] 4.5h   400/800    172kg   2K/2Ca bath   TDC   No heparin (+ HIT)   Assessment/Plan: 1. Acute hypoxic respiratory failure: Remains on supplemental oxygen and ongoing efforts at continued ultrafiltration/lowering EDW with dialysis (anticipating that he has suffered weight loss during this hospitalization). He is stable at baseline O2 requirement, need standing weights 2. MSSA Bacteremia: TDC replaced after he underwent line holiday 05/26/23-05/30/23.  Continue cefazolin IV 2. ESRD: HD MWF schedule and utilizing sodium citrate for dialysis catheter lock given history of heparin-induced thrombocytopenia.  HD is refused HD as above, after today next dialysis Monday 3. HTN/volume: He remains on midodrine 30mg  TID, still having some soft blood pressures, continue current dose and will reeval to see if we can decrease dose daily 4. Anemia: Hgb 11.1 at goal, currently on  ESA 150 weekly.  Will decrease to 60 with Hgb over 10 5. Secondary hyperparathyroidism: Calcium level elevated and VDRA remains on hold, phosphorus 4.7 =lanthanum  Phos binder 6. Nutrition:  Continue renal diet and protein  supplements 7.  Deconditioning right foot drop= rehab  with CIR  Lenny Pastel, PA-C [facility] [phone] 06/11/2023,12:21 PM  LOS: 8 days   Labs: Basic Metabolic Panel: Recent Labs  Lab 06/06/23 1418 06/08/23 1116 06/10/23 1414  NA 137 134* 134*  K 3.9 3.9 4.2  CL 97* 97* 94*  CO2 24 20* 24  GLUCOSE 97 105* 105*  BUN 29* 25* 25*  CREATININE 10.18* 9.32* 8.62*  CALCIUM 10.1 10.5* 10.4*  PHOS 4.6 4.5 4.7*   Liver Function Tests: Recent Labs  Lab 06/06/23 1418 06/08/23 1116 06/10/23 1414  ALBUMIN 2.8* 3.2* 3.1*   No results for input(s): "LIPASE", "AMYLASE" in the last 168 hours. No results for input(s): "AMMONIA" in the last 168 hours. CBC: Recent Labs  Lab 06/06/23 1418 06/08/23 1116 06/10/23 1414  WBC 9.3 8.2 7.7  HGB 10.3* 11.0* 11.1*  HCT 33.2* 35.5* 36.1*  MCV 100.3* 98.6 98.1  PLT 345 307 281   Cardiac Enzymes: No results for input(s): "CKTOTAL", "CKMB", "CKMBINDEX", "TROPONINI" in the last 168 hours. CBG: No results for input(s): "GLUCAP" in the last 168 hours.  Studies/Results: No results found. Medications:  anticoagulant sodium citrate     anticoagulant sodium citrate      ceFAZolin (ANCEF) IV Stopped (06/08/23 2002)    apixaban  5 mg Oral BID   Chlorhexidine Gluconate Cloth  6 each Topical Q12H   Chlorhexidine Gluconate Cloth  6 each Topical Q0600  cholecalciferol  400 Units Oral Daily   darbepoetin (ARANESP) injection - DIALYSIS  150 mcg Subcutaneous Q Sat-1800   digoxin  0.125 mg Oral Once per day on Monday Wednesday Friday   feeding supplement (NEPRO CARB STEADY)  237 mL Oral BID BM   gabapentin  300 mg Oral Q M,W,F-HD   Gerhardt's butt cream   Topical TID   lanthanum  2,000 mg Oral TID WC   leptospermum manuka honey  1 Application Topical Daily   levothyroxine  25 mcg Oral Q0600   melatonin  5 mg Oral QHS   midodrine  30 mg Oral TID WC

## 2023-06-11 NOTE — Progress Notes (Signed)
PROGRESS NOTE   Subjective/Complaints:  Pt apparently refused to go to HD last night. I'm unclear why he refused. Had questions about  numbness in his feet and his voice today  ROS: Patient denies fever, rash, sore throat, blurred vision, dizziness, nausea, vomiting, diarrhea, cough, shortness of breath or chest pain,  headache, or mood change.   Objective:   No results found. Recent Labs    06/08/23 1116 06/10/23 1414  WBC 8.2 7.7  HGB 11.0* 11.1*  HCT 35.5* 36.1*  PLT 307 281    Recent Labs    06/08/23 1116 06/10/23 1414  NA 134* 134*  K 3.9 4.2  CL 97* 94*  CO2 20* 24  GLUCOSE 105* 105*  BUN 25* 25*  CREATININE 9.32* 8.62*  CALCIUM 10.5* 10.4*     Intake/Output Summary (Last 24 hours) at 06/11/2023 1044 Last data filed at 06/11/2023 0824 Gross per 24 hour  Intake 600 ml  Output --  Net 600 ml     Pressure Injury 05/18/23 Buttocks Right Stage 3 -  Full thickness tissue loss. Subcutaneous fat may be visible but bone, tendon or muscle are NOT exposed. (Active)  05/18/23 2300  Location: Buttocks  Location Orientation: Right  Staging: Stage 3 -  Full thickness tissue loss. Subcutaneous fat may be visible but bone, tendon or muscle are NOT exposed.  Wound Description (Comments):   Present on Admission: Yes (original date 05/18/23; present on admit to CIR 06/03/23)     Pressure Injury 05/18/23 Buttocks Left Stage 2 -  Partial thickness loss of dermis presenting as a shallow open injury with a red, pink wound bed without slough. (Active)  05/18/23 2300  Location: Buttocks  Location Orientation: Left  Staging: Stage 2 -  Partial thickness loss of dermis presenting as a shallow open injury with a red, pink wound bed without slough.  Wound Description (Comments):   Present on Admission: Yes (original date 05/18/23; present on admit to CIR 06/03/23)    Physical Exam: Vital Signs Blood pressure (!) 106/55,  pulse 77, temperature 97.8 F (36.6 C), resp. rate 19, height 6' (1.829 m), weight (!) 153.6 kg, SpO2 100%.  Constitutional: No distress . Vital signs reviewed. HEENT: NCAT, EOMI, oral membranes moist Neck: supple Cardiovascular: RRR without murmur. No JVD    Respiratory/Chest: CTA Bilaterally without wheezes or rales. Normal effort    GI/Abdomen: BS +, non-tender, non-distended Ext: no clubbing, cyanosis, or edema Psych: pleasant and cooperative  L/R buttock wounds were not examined  Neurologic: Cranial nerves II through XII intact, motor strength is 5/5 in bilateral deltoid, bicep, tricep, grip, 0/5 right  ankle dorsiflexor and plantar flexor 5/5 RIght quad 4/5 R hamstring, 5/5 left quad and hamstring and ankle  Sensory exam absent LT sensation RIght foot/ankle , no proprio Right toes , partial proprio at R ankle ,   Left foot intact sensation LT   Musculoskeletal: Full range of motion in all 4 extremities. No joint swelling  Assessment/Plan: 1. Functional deficits which require 3+ hours per day of interdisciplinary therapy in a comprehensive inpatient rehab setting. Physiatrist is providing close team supervision and 24 hour management of active medical problems listed below.  Physiatrist and rehab team continue to assess barriers to discharge/monitor patient progress toward functional and medical goals  Care Tool:  Bathing    Body parts bathed by patient: Right arm, Left arm, Chest, Abdomen, Front perineal area, Face   Body parts bathed by helper: Left lower leg, Right lower leg, Left upper leg, Buttocks, Right upper leg     Bathing assist Assist Level: Supervision/Verbal cueing     Upper Body Dressing/Undressing Upper body dressing   What is the patient wearing?: Hospital gown only    Upper body assist Assist Level: Minimal Assistance - Patient > 75%    Lower Body Dressing/Undressing Lower body dressing      What is the patient wearing?: Underwear/pull up, Hospital  gown only     Lower body assist Assist for lower body dressing: Moderate Assistance - Patient 50 - 74%     Toileting Toileting    Toileting assist Assist for toileting: Supervision/Verbal cueing     Transfers Chair/bed transfer  Transfers assist     Chair/bed transfer assist level: Moderate Assistance - Patient 50 - 74%     Locomotion Ambulation   Ambulation assist      Assist level: Minimal Assistance - Patient > 75% Assistive device: Walker-rolling Max distance: 8 ft   Walk 10 feet activity   Assist  Walk 10 feet activity did not occur: Safety/medical concerns        Walk 50 feet activity   Assist Walk 50 feet with 2 turns activity did not occur: Safety/medical concerns         Walk 150 feet activity   Assist Walk 150 feet activity did not occur: Safety/medical concerns         Walk 10 feet on uneven surface  activity   Assist Walk 10 feet on uneven surfaces activity did not occur: Safety/medical concerns         Wheelchair     Assist Is the patient using a wheelchair?: Yes Type of Wheelchair: Manual    Wheelchair assist level: Dependent - Patient 0% Max wheelchair distance: 400 ft    Wheelchair 50 feet with 2 turns activity    Assist        Assist Level: Dependent - Patient 0%   Wheelchair 150 feet activity     Assist      Assist Level: Dependent - Patient 0%   Blood pressure (!) 106/55, pulse 77, temperature 97.8 F (36.6 C), resp. rate 19, height 6' (1.829 m), weight (!) 153.6 kg, SpO2 100%.  Medical Problem List and Plan: 1. Functional deficits secondary to debility from MSSA bacteremia, c/b multiple medical comorbidities             -patient may shower with buttocks wound and lines covered             -ELOS/Goals: 11/26 , supervision PT/OT,   -Continue CIR therapies including PT, OT  2.  Antithrombotics: -DVT/anticoagulation:  Pharmaceutical: Eliquis 5 mg BID             -antiplatelet therapy:  none   3. Pain Management: Tylenol as needed             -PO Dilaudid initiated on acute side due to nausea with oxycodone--> On admission patient requests to switch back, changed back to oxycodone 15 mg Q4H PRN and Dced Dilaudid, Has only taken 4  x oxy IR 15mg  tabs in the last 48h 11/12              -  For neuropathic pain gabapentin 300 mg MWF- will add lidocaine cream, discussed pregabalin which pt declines at this time   -discussed etiology of numbness in legs. Seems to think it's peripheral neuropathy.               4. Mood/Behavior/Sleep: LCSW to evaluate and provide emotional support             -continue melatonin 5 mg q HS             -antipsychotic agents: n/a          -11/11 Start alprazolam 0.25mg  PRN BID for anxiety  continues to voice concerns about his acute care stay , will have neuropsych followup today  11/16 pt appreciates neuropsych visit. Feels overwhelmed by what's happening to him. "Why me?" Offered him ego support 5. Neuropsych/cognition: This patient is capable of making decisions on his own behalf.              -     6. Skin/Wound Care: Routine skin care checks             -pressure injury left buttock/posterior thigh>>continue wound care              - Offered LAL mattress given HX pressure wounds; patient declines- RIght buttocks wound has been healing well with standard mattress   7. Fluids/Electrolytes/Nutrition: Routine Is and Os and follow-up chemistries -Renal diet with 1200 cc fluid restriction             -Continue Fosrenol 2000 mg 3 times daily with meals   8: Hypotension: monitor TID and prn             -continue midodrine 30 mg 3 times daily with meals Encourage patient to discuss dosing with nephrology. No current orthostatic drops on current dose    Vitals:   06/10/23 2229 06/11/23 0524  BP: 105/67 (!) 106/55  Pulse: (!) 50 77  Resp: 18 19  Temp: (!) 97.5 F (36.4 C) 97.8 F (36.6 C)  SpO2: 100% 100%   On M-W-F digoxin    9: pAF: on  Eliquis              -unable to tolerate amiodarone (requires midodrine for BP so not a candidate for nodal agents)   10: Hypothyroidism: Continue Synthroid 25 mcg daily   11: Chronic systolic heart failure: EF ~30-35%             -continue digoxin 0.125 mg  3 x weekly             -daily weight             11/16 -volume mgt>>HD 3 x weekly--pt refused last night   -defer to renal regarding continuance of HD   12: Chronic hypoxic respiratory failure: on home O2 5 L via [place]   13: LE neuropathy/ R foot drop: CAM boot              - Pattern of weakness and sensory loss consistent with sciatic neuropathy above the knee (mild weakness  hamstrings); likely compressive from ICU stay  - hopeful for improvement and likely will need EMG as OP -11/11 Continue TED hoses patient reports this is helping his neuropathic pain.  Gabapentin dose limited by renal function pt feels dosing after HD is more helpful .  Could consider trying Lyrica instead of gabapentin if pain worsens   14: Morbid Obesity: Body mass index is 45.93 kg/m.  15: Anemia: on ESP   16: History or HIT positivity>>no heparin   17: MSSA bacteremia: will continue Ancef 2 g with HD treatment 3x/week (through 12/09)   18. Vocal hoarseness s/p extubation. Consider ENT evaluation if no improvement.   11/16-discussed etiology and potential plan with pt today--likely outpt ENT referral if needed 19.  ESRD on HD- nephro to manage     20. Insomnia  -  melatonin 5mg   LOS: 8 days A FACE TO FACE EVALUATION WAS PERFORMED  Ranelle Oyster 06/11/2023, 10:44 AM

## 2023-06-11 NOTE — Progress Notes (Signed)
Physical Therapy Session Note  Patient Details  Name: Phillip Young MRN: [id number] Date of Birth: 11-16-1985  Today's Date: 06/11/2023 PT Individual Time: 1117-1200 PT Individual Time Calculation (min): 43 min   Short Term Goals: Week 1:  PT Short Term Goal 1 (Week 1): Pt will perform bed mobility with overall supervision with no c/o dizziness and change in HR <20 bpm. PT Short Term Goal 1 - Progress (Week 1): Revised due to lack of progress PT Short Term Goal 2 (Week 1): Pt will perform sit<>stand with CGA and seat height at 90/90 hip/ knee angles. PT Short Term Goal 2 - Progress (Week 1): Met PT Short Term Goal 3 (Week 1): Pt will perform stand pivot transfers with CGA/ MinA using LRAD. PT Short Term Goal 3 - Progress (Week 1): Met PT Short Term Goal 4 (Week 1): pt will ambulate at least 20 ft using LRAD with overall CGA/ MinA. PT Short Term Goal 4 - Progress (Week 1): Met  Skilled Therapeutic Interventions/Progress Updates: Pt presents semi-reclined in bed and agreeable to therapy but c/o pain to B feet.  Pt required HOB elevated for transfer sup to sit w/ CGA and increased time.  Pt sat EOB w/ pillow under feet.  Pt sat w/o UE support and performed LE there ex for increased strength.  Pt very verbose about condition and hospital stay along w/ previous admission.  Pt performed sit to supine w/ mod A for LES 2/2 weakness.  Pt readying for dialysis.  Pt remained supine w/ all needs in reach.     Therapy Documentation Precautions:  Precautions Precautions: Fall Precaution Comments: R sided weakness, R foot drop, O2 with activity, watch HR (has a-fib), Stage 3 wound on buttock Required Braces or Orthoses: Other Brace Other Brace: R foot-up brace, or CAM boot Restrictions Weight Bearing Restrictions: No General:   Vital Signs:   Pain:9/10 B feet      Therapy/Group: Individual Therapy  Lucio Edward 06/11/2023, 12:09 PM

## 2023-06-11 NOTE — Progress Notes (Signed)
Physical Therapy Weekly Progress Note  Patient Details  Name: Phillip Young MRN: [id number] Date of Birth: 04-21-1986  Beginning of progress report period: June 04, 2023 End of progress report period: June 11, 2023  Today's Date: 06/11/2023 PT Individual Time: 0805-0907 PT Individual Time Calculation (min): 62 min   Patient has met 3 of 4 short term goals.  Pt is currently minA for bed mobility for LE management d/t high neuropathic pain. He currently is CGA/minA for STS & bed to chair transfers using RW. He demos segmental STS transfer d/t difficulty w/ power up and increased use of BUE. He is able to ambulate 21 ft w/ RW and CGA/minA. Pt is limited by high anxiety, fear, and neuropathic pain. Pt is on 2L of O2.  Patient continues to demonstrate the following deficits muscle weakness and muscle paralysis, decreased cardiorespiratoy endurance, impaired timing and sequencing, abnormal tone, unbalanced muscle activation, decreased coordination, and decreased motor planning,  , decreased motor planning, decreased initiation, decreased problem solving, and decreased memory,  , and decreased sitting balance, decreased standing balance, decreased postural control, and decreased balance strategies and therefore will continue to benefit from skilled PT intervention to increase functional independence with mobility.  Patient progressing toward long term goals..  Continue plan of care.  PT Short Term Goals Week 1:  PT Short Term Goal 1 (Week 1): Pt will perform bed mobility with overall supervision with no c/o dizziness and change in HR <20 bpm. PT Short Term Goal 1 - Progress (Week 1): Revised due to lack of progress PT Short Term Goal 2 (Week 1): Pt will perform sit<>stand with CGA and seat height at 90/90 hip/ knee angles. PT Short Term Goal 2 - Progress (Week 1): Met PT Short Term Goal 3 (Week 1): Pt will perform stand pivot transfers with CGA/ MinA using LRAD. PT Short Term Goal 3 -  Progress (Week 1): Met PT Short Term Goal 4 (Week 1): pt will ambulate at least 20 ft using LRAD with overall CGA/ MinA. PT Short Term Goal 4 - Progress (Week 1): Met Week 2:  PT Short Term Goal 1 (Week 2): =LTG's  Skilled Therapeutic Interventions/Progress Updates:    Pt received in bed, alarm activated, agreeable to PT. Pt on 2L of O2 transitioned from wall to tank for session. Placed back on wall at end of session.  Bed mobility: HOB elevated minA from pt RUE to assist upright from supine. Pt able to bring BLE off EOB.  Sitting on EOB pt able to donn UE and LE dressing w/ supervision. TotalA donning shoes d/t pain. Once in standing pt able to assist pulling LE dressing up all they way w/ modA and RUE support on RW.  STS: CGA from WC & EOB w/ use of RW to assist  Pt dependently transported to gym for energy conservation.  Gait training: Pt requested the below intervention despite having high anxiety yesterday. - Lite gait: 42 ft & 39 ft. 2nd gait trial required increased body weight support d/t fatigue. Pt demo great improvements in emotional well being and anxiety control. Pt demo ability to stand up straight using his legs to assist him rather than the harness. Pt intermittently paused for less than 30 seconds and leaned back into harness before resetting and continuing on. Pt benefits from verbal positive feedback and encouragement to conquer ambulation. Use of R DF assist ace wrap d/t foot drop. Pt is limited by fear, anxiety, generalized weakness, and poor endurance. L ankle pain  while walking (unrated) but was not limited.  Education: pt educated on the need for lace up supportive sneakers that are wide enough to not cause pain. PT provided 3 options of sneakers for him to order. Pt reported he will order them and have them here at the beginning of the week to be able to trial AFO prior to DC.  Pt left in bed, alarm activated, all needs met.  Therapy Documentation Precautions:   Precautions Precautions: Fall Precaution Comments: R sided weakness, R foot drop, O2 with activity, watch HR (has a-fib), Stage 3 wound on buttock Required Braces or Orthoses: Other Brace Other Brace: R foot-up brace, or CAM boot Restrictions Weight Bearing Restrictions: No Pain: Pt reports 8/10 constant neuropathic pain during session. Pain did not limit treatment. Rest breaks and medications for pain control.  Therapy/Group: Individual Therapy  SCHUYLER MCLAWHORN 06/11/2023, 7:39 AM

## 2023-06-12 DIAGNOSIS — R5381 Other malaise: Secondary | ICD-10-CM | POA: Diagnosis not present

## 2023-06-12 DIAGNOSIS — E861 Hypovolemia: Secondary | ICD-10-CM | POA: Diagnosis not present

## 2023-06-12 DIAGNOSIS — E8779 Other fluid overload: Secondary | ICD-10-CM | POA: Diagnosis not present

## 2023-06-12 DIAGNOSIS — G6281 Critical illness polyneuropathy: Secondary | ICD-10-CM | POA: Diagnosis not present

## 2023-06-12 MED ORDER — GABAPENTIN 300 MG PO CAPS
300.0000 mg | ORAL_CAPSULE | Freq: Every day | ORAL | Status: DC
  Administered 2023-06-12 – 2023-06-13 (×2): 300 mg via ORAL
  Filled 2023-06-12 (×3): qty 1

## 2023-06-12 NOTE — Progress Notes (Signed)
PROGRESS NOTE   Subjective/Complaints:  Pt went to HD yesterday. Pt says he didn't go on  Friday because he wanted to go later in evening. Still having pain in both feet/lets, L>R  ROS: Patient denies fever, rash, sore throat, blurred vision, dizziness, nausea, vomiting, diarrhea, cough, shortness of breath or chest pain,   headache, or mood change.      Objective:   No results found. Recent Labs    06/10/23 1414 06/11/23 1348  WBC 7.7 7.0  HGB 11.1* 10.1*  HCT 36.1* 32.1*  PLT 281 247    Recent Labs    06/10/23 1414 06/11/23 1348  NA 134* 135  K 4.2 4.0  CL 94* 98  CO2 24 22  GLUCOSE 105* 105*  BUN 25* 35*  CREATININE 8.62* 10.18*  CALCIUM 10.4* 10.0     Intake/Output Summary (Last 24 hours) at 06/12/2023 0933 Last data filed at 06/12/2023 0750 Gross per 24 hour  Intake 237 ml  Output 900 ml  Net -663 ml     Pressure Injury 05/18/23 Buttocks Right Stage 3 -  Full thickness tissue loss. Subcutaneous fat may be visible but bone, tendon or muscle are NOT exposed. (Active)  05/18/23 2300  Location: Buttocks  Location Orientation: Right  Staging: Stage 3 -  Full thickness tissue loss. Subcutaneous fat may be visible but bone, tendon or muscle are NOT exposed.  Wound Description (Comments):   Present on Admission: Yes (original date 05/18/23; present on admit to CIR 06/03/23)     Pressure Injury 05/18/23 Buttocks Left Stage 2 -  Partial thickness loss of dermis presenting as a shallow open injury with a red, pink wound bed without slough. (Active)  05/18/23 2300  Location: Buttocks  Location Orientation: Left  Staging: Stage 2 -  Partial thickness loss of dermis presenting as a shallow open injury with a red, pink wound bed without slough.  Wound Description (Comments):   Present on Admission: Yes (original date 05/18/23; present on admit to CIR 06/03/23)    Physical Exam: Vital Signs Blood pressure  (!) 110/50, pulse 91, temperature 97.9 F (36.6 C), resp. rate 17, height 6' (1.829 m), weight (!) 153.6 kg, SpO2 100%.  Constitutional: No distress . Vital signs reviewed. obese HEENT: NCAT, EOMI, oral membranes moist Neck: supple Cardiovascular: RRR without murmur. No JVD    Respiratory/Chest: CTA Bilaterally without wheezes or rales. Normal effort    GI/Abdomen: BS +, non-tender, non-distended Ext: no clubbing, cyanosis, or edema Psych: pleasant and cooperative  L/R buttock wounds dressed. Neurologic: Cranial nerves II through XII intact, motor strength is 5/5 in bilateral deltoid, bicep, tricep, grip, 0/5 right  ankle dorsiflexor and plantar flexor 5/5 RIght quad 4/5 R hamstring, 5/5 left quad and hamstring and ankle  Sensory exam absent LT sensation RIght foot/ankle , no proprio Right toes , partial proprio at R ankle ,   Left foot intact sensation LT--motor and sensory exam essentially unchanged 11/17   Musculoskeletal: Full range of motion in all 4 extremities. No joint swelling  Assessment/Plan: 1. Functional deficits which require 3+ hours per day of interdisciplinary therapy in a comprehensive inpatient rehab setting. Physiatrist is providing close team  supervision and 24 hour management of active medical problems listed below. Physiatrist and rehab team continue to assess barriers to discharge/monitor patient progress toward functional and medical goals  Care Tool:  Bathing    Body parts bathed by patient: Right arm, Left arm, Chest, Abdomen, Front perineal area, Face   Body parts bathed by helper: Left lower leg, Right lower leg, Left upper leg, Buttocks, Right upper leg     Bathing assist Assist Level: Supervision/Verbal cueing     Upper Body Dressing/Undressing Upper body dressing   What is the patient wearing?: Hospital gown only    Upper body assist Assist Level: Minimal Assistance - Patient > 75%    Lower Body Dressing/Undressing Lower body dressing       What is the patient wearing?: Underwear/pull up, Hospital gown only     Lower body assist Assist for lower body dressing: Moderate Assistance - Patient 50 - 74%     Toileting Toileting    Toileting assist Assist for toileting: Supervision/Verbal cueing     Transfers Chair/bed transfer  Transfers assist     Chair/bed transfer assist level: Moderate Assistance - Patient 50 - 74%     Locomotion Ambulation   Ambulation assist      Assist level: Minimal Assistance - Patient > 75% Assistive device: Walker-rolling Max distance: 8 ft   Walk 10 feet activity   Assist  Walk 10 feet activity did not occur: Safety/medical concerns        Walk 50 feet activity   Assist Walk 50 feet with 2 turns activity did not occur: Safety/medical concerns         Walk 150 feet activity   Assist Walk 150 feet activity did not occur: Safety/medical concerns         Walk 10 feet on uneven surface  activity   Assist Walk 10 feet on uneven surfaces activity did not occur: Safety/medical concerns         Wheelchair     Assist Is the patient using a wheelchair?: Yes Type of Wheelchair: Manual    Wheelchair assist level: Dependent - Patient 0% Max wheelchair distance: 400 ft    Wheelchair 50 feet with 2 turns activity    Assist        Assist Level: Dependent - Patient 0%   Wheelchair 150 feet activity     Assist      Assist Level: Dependent - Patient 0%   Blood pressure (!) 110/50, pulse 91, temperature 97.9 F (36.6 C), resp. rate 17, height 6' (1.829 m), weight (!) 153.6 kg, SpO2 100%.  Medical Problem List and Plan: 1. Functional deficits secondary to debility from MSSA bacteremia, c/b multiple medical comorbidities             -patient may shower with buttocks wound and lines covered             -ELOS/Goals: 11/26 , supervision PT/OT,   -Continue CIR therapies including PT, OT  2.  Antithrombotics: -DVT/anticoagulation:   Pharmaceutical: Eliquis 5 mg BID             -antiplatelet therapy: none   3. Pain Management: Tylenol as needed             -PO Dilaudid initiated on acute side due to nausea with oxycodone--> On admission patient requests to switch back, changed back to oxycodone 15 mg Q4H PRN and Dced Dilaudid, Has only taken 4  x oxy IR 15mg  tabs in the last 48h  11/12              -   lidocaine cream, discussed pregabalin which pt declines at this time   -have discussed etiology of numbness in legs.  -11/17 will adjust gabapentin to 300mg  at bedtime rather than just after HD               4. Mood/Behavior/Sleep: LCSW to evaluate and provide emotional support             -continue melatonin 5 mg q HS             -antipsychotic agents: n/a          -11/11 Start alprazolam 0.25mg  PRN BID for anxiety  continues to voice concerns about his acute care stay , will have neuropsych followup today  11/16 pt appreciates neuropsych visit. Feels overwhelmed by what's happening to him. "Why me?" Offered him ego support  11/17 offered ongoing support. 5. Neuropsych/cognition: This patient is capable of making decisions on his own behalf.              -     6. Skin/Wound Care: Routine skin care checks             -pressure injury left buttock/posterior thigh>>continue wound care              - Offered LAL mattress given HX pressure wounds; patient declines- RIght buttocks wound has been healing well with standard mattress   7. Fluids/Electrolytes/Nutrition: Routine Is and Os and follow-up chemistries -Renal diet with 1200 cc fluid restriction             -Continue Fosrenol 2000 mg 3 times daily with meals   8: Hypotension: monitor TID and prn             -continue midodrine 30 mg 3 times daily with meals Encourage patient to discuss dosing with nephrology. No current orthostatic drops on current dose    Vitals:   06/11/23 1923 06/12/23 0639  BP: 101/66 (!) 110/50  Pulse: 65 91  Resp: 17 17  Temp: 98.6 F  (37 C) 97.9 F (36.6 C)  SpO2: 100% 100%   On M-W-F digoxin    9: pAF: on Eliquis              -unable to tolerate amiodarone (requires midodrine for BP so not a candidate for nodal agents)   10: Hypothyroidism: Continue Synthroid 25 mcg daily   11: Chronic systolic heart failure: EF ~30-35%             -continue digoxin 0.125 mg  3 x weekly             -daily weight             11/16 -volume mgt>>HD 3 x weekly--pt refused Friday due to timing.   11/17 - took HD yesterday   -defer to renal regarding continuance of HD   12: Chronic hypoxic respiratory failure: on home O2 5 L via [place]   13: LE neuropathy/ R foot drop: CAM boot              - Pattern of weakness and sensory loss consistent with sciatic neuropathy above the knee (mild weakness  hamstrings); likely compressive from ICU stay  - hopeful for improvement and likely will need EMG as OP -11/11 Continue TED hoses patient reports this is helping his neuropathic pain.  Gabapentin dose limited by renal function pt feels dosing after HD  is more helpful .  Could consider trying Lyrica instead of gabapentin if pain worsens   14: Morbid Obesity: Body mass index is 45.93 kg/m.    15: Anemia: on ESP   16: History or HIT positivity>>no heparin   17: MSSA bacteremia: will continue Ancef 2 g with HD treatment 3x/week (through 12/09)   18. Vocal hoarseness s/p extubation. Consider ENT evaluation if no improvement.   11/16-discussed etiology and potential plan with pt today--likely outpt ENT referral if still symptomatic at that point 19.  ESRD on HD- nephro to manage     20. Insomnia  -  melatonin 5mg  somewhat helpful  LOS: 9 days A FACE TO FACE EVALUATION WAS PERFORMED  Ranelle Oyster 06/12/2023, 9:33 AM

## 2023-06-12 NOTE — Progress Notes (Signed)
Subjective: Seen in room, lying flat in bed denies shortness of breath or  chest pain said tolerated dialysis yesterday.  I had discussion again about him refusing dialysis on Friday stressed importance with current staffing issues he is not able to mandate when he goes. will keep on schedule Tuesday Thursday Saturday should help with his rehab better PT schedule also  Objective Vital signs in last 24 hours: Vitals:   06/11/23 1730 06/11/23 1824 06/11/23 1923 06/12/23 0639  BP: 111/67 110/77 101/66 (!) 110/50  Pulse:  62 65 91  Resp: (!) 26 19 17 17   Temp:  98.2 F (36.8 C) 98.6 F (37 C) 97.9 F (36.6 C)  TempSrc:  Oral Oral   SpO2: 100% 100% 100% 100%  Weight:      Height:       Weight change:   Physical Exam: General: Alert obese male NAD Heart: RRR no MRG Lungs: CTA bilaterally nonlabored breathing Abdomen: Obese NABS soft NT ND Extremities: Bipedal edema  Dialysis Access: TDC with intact bandage   OP  dialysis Orders: MWF at [facility] 4.5h   400/800    172kg   2K/2Ca bath   TDC   No heparin (+ HIT)   Assessment/Plan: 1. Acute hypoxic respiratory failure: Remains on supplemental oxygen and ongoing efforts at continued ultrafiltration/lowering EDW with dialysis (anticipating that he has suffered weight loss during this hospitalization). He is stable at baseline O2 requirement, need standing weights 2. MSSA Bacteremia: TDC replaced after he underwent line holiday 05/26/23-05/30/23.  Continue cefazolin IV 2. ESRD: HD MWF schedule But changed  to TTS now As above. and utilizing sodium citrate for dialysis catheter lock given history of heparin-induced thrombocytopenia.  HD is refused HD as above, after today next dialysis Monday 3. HTN/volume: He remains on midodrine 30mg  TID, still having some soft blood pressures, continue current dose and will reeval to see if we can decrease dose daily 4. Anemia: Hgb 10.1 at goal, currently on  ESA 150 weekly.  Will decrease to 60 with Hgb over  10 5. Secondary hyperparathyroidism: Calcium level elevated and VDRA remains on hold, phosphorus 4.7 =lanthanum  Phos binder 6. Nutrition:  Continue renal diet and protein supplements 7.  Deconditioning right foot drop= rehab  with CIR  Lenny Pastel, PA-C [facility] [phone] 06/12/2023,12:20 PM  LOS: 9 days   Labs: Basic Metabolic Panel: Recent Labs  Lab 06/06/23 1418 06/08/23 1116 06/10/23 1414 06/11/23 1348  NA 137 134* 134* 135  K 3.9 3.9 4.2 4.0  CL 97* 97* 94* 98  CO2 24 20* 24 22  GLUCOSE 97 105* 105* 105*  BUN 29* 25* 25* 35*  CREATININE 10.18* 9.32* 8.62* 10.18*  CALCIUM 10.1 10.5* 10.4* 10.0  PHOS 4.6 4.5 4.7*  --    Liver Function Tests: Recent Labs  Lab 06/06/23 1418 06/08/23 1116 06/10/23 1414  ALBUMIN 2.8* 3.2* 3.1*   No results for input(s): "LIPASE", "AMYLASE" in the last 168 hours. No results for input(s): "AMMONIA" in the last 168 hours. CBC: Recent Labs  Lab 06/06/23 1418 06/08/23 1116 06/10/23 1414 06/11/23 1348  WBC 9.3 8.2 7.7 7.0  NEUTROABS  --   --   --  4.0  HGB 10.3* 11.0* 11.1* 10.1*  HCT 33.2* 35.5* 36.1* 32.1*  MCV 100.3* 98.6 98.1 97.9  PLT 345 307 281 247   Cardiac Enzymes: No results for input(s): "CKTOTAL", "CKMB", "CKMBINDEX", "TROPONINI" in the last 168 hours. CBG: No results for input(s): "GLUCAP" in the  last 168 hours.  Studies/Results: No results found. Medications:  anticoagulant sodium citrate      ceFAZolin (ANCEF) IV Stopped (06/08/23 2002)    apixaban  5 mg Oral BID   Chlorhexidine Gluconate Cloth  6 each Topical Q12H   cholecalciferol  400 Units Oral Daily   darbepoetin (ARANESP) injection - DIALYSIS  60 mcg Subcutaneous Q Sat-1800   digoxin  0.125 mg Oral Once per day on Monday Wednesday Friday   feeding supplement (NEPRO CARB STEADY)  237 mL Oral BID BM   gabapentin  300 mg Oral QHS   Gerhardt's butt cream   Topical TID   lanthanum  2,000 mg Oral TID WC   leptospermum manuka  honey  1 Application Topical Daily   levothyroxine  25 mcg Oral Q0600   melatonin  5 mg Oral QHS   midodrine  30 mg Oral TID WC

## 2023-06-13 ENCOUNTER — Inpatient Hospital Stay (HOSPITAL_COMMUNITY): Payer: Medicaid Other

## 2023-06-13 DIAGNOSIS — G5701 Lesion of sciatic nerve, right lower limb: Secondary | ICD-10-CM | POA: Diagnosis not present

## 2023-06-13 DIAGNOSIS — R5381 Other malaise: Secondary | ICD-10-CM | POA: Diagnosis not present

## 2023-06-13 DIAGNOSIS — I953 Hypotension of hemodialysis: Secondary | ICD-10-CM

## 2023-06-13 DIAGNOSIS — I5022 Chronic systolic (congestive) heart failure: Secondary | ICD-10-CM

## 2023-06-13 DIAGNOSIS — N186 End stage renal disease: Secondary | ICD-10-CM | POA: Diagnosis not present

## 2023-06-13 LAB — HEPATITIS B SURFACE ANTIGEN: Hepatitis B Surface Ag: NONREACTIVE

## 2023-06-13 MED ORDER — CEFAZOLIN SODIUM-DEXTROSE 2-4 GM/100ML-% IV SOLN
2.0000 g | INTRAVENOUS | Status: DC
  Administered 2023-06-15: 2 g via INTRAVENOUS
  Filled 2023-06-13: qty 100

## 2023-06-13 NOTE — Progress Notes (Signed)
PROGRESS NOTE   Subjective/Complaints:  Type 5 BM 11/16, RIght shoulder pain with overhead reaching  ROS: Patient denies SOB, DOE, feels needs to increase O2 at noc , No GI distress      Objective:   No results found. Recent Labs    06/10/23 1414 06/11/23 1348  WBC 7.7 7.0  HGB 11.1* 10.1*  HCT 36.1* 32.1*  PLT 281 247    Recent Labs    06/10/23 1414 06/11/23 1348  NA 134* 135  K 4.2 4.0  CL 94* 98  CO2 24 22  GLUCOSE 105* 105*  BUN 25* 35*  CREATININE 8.62* 10.18*  CALCIUM 10.4* 10.0     Intake/Output Summary (Last 24 hours) at 06/13/2023 1013 Last data filed at 06/13/2023 0730 Gross per 24 hour  Intake 831 ml  Output --  Net 831 ml     Pressure Injury 05/18/23 Buttocks Right Stage 3 -  Full thickness tissue loss. Subcutaneous fat may be visible but bone, tendon or muscle are NOT exposed. (Active)  05/18/23 2300  Location: Buttocks  Location Orientation: Right  Staging: Stage 3 -  Full thickness tissue loss. Subcutaneous fat may be visible but bone, tendon or muscle are NOT exposed.  Wound Description (Comments):   Present on Admission: Yes (original date 05/18/23; present on admit to CIR 06/03/23)     Pressure Injury 05/18/23 Buttocks Left Stage 2 -  Partial thickness loss of dermis presenting as a shallow open injury with a red, pink wound bed without slough. (Active)  05/18/23 2300  Location: Buttocks  Location Orientation: Left  Staging: Stage 2 -  Partial thickness loss of dermis presenting as a shallow open injury with a red, pink wound bed without slough.  Wound Description (Comments):   Present on Admission: Yes (original date 05/18/23; present on admit to CIR 06/03/23)    Physical Exam: Vital Signs Blood pressure (!) 124/108, pulse (!) 59, temperature 97.9 F (36.6 C), resp. rate 16, height 6' (1.829 m), weight (!) 153.6 kg, SpO2 100%.   General: No acute distress Mood and affect  are appropriate Heart: Regular rate and rhythm no rubs murmurs or extra sounds Lungs: Clear to auscultation, breathing unlabored, no rales or wheezes Abdomen: Positive bowel sounds, soft nontender to palpation, nondistended Extremities: No clubbing, cyanosis, or edema   L/R buttock wounds dressed. Neurologic: Cranial nerves II through XII intact, motor strength is 5/5 in bilateral deltoid, bicep, tricep, grip, 0/5 right  ankle dorsiflexor and plantar flexor 5/5 RIght quad 4/5 R hamstring, 5/5 left quad and hamstring and ankle  Sensory exam absent LT sensation RIght foot/ankle , no proprio Right toes , partial proprio at R ankle ,   Left foot intact sensation LT--motor and sensory exam essentially unchanged 11/17   Musculoskeletal:+ drop arm test on Right side , + Impingement with abd R side at 90 deg  Assessment/Plan: 1. Functional deficits which require 3+ hours per day of interdisciplinary therapy in a comprehensive inpatient rehab setting. Physiatrist is providing close team supervision and 24 hour management of active medical problems listed below. Physiatrist and rehab team continue to assess barriers to discharge/monitor patient progress toward functional and medical goals  Care Tool:  Bathing    Body parts bathed by patient: Right arm, Left arm, Chest, Abdomen, Front perineal area, Face   Body parts bathed by helper: Left lower leg, Right lower leg, Left upper leg, Buttocks, Right upper leg     Bathing assist Assist Level: Supervision/Verbal cueing     Upper Body Dressing/Undressing Upper body dressing   What is the patient wearing?: Hospital gown only    Upper body assist Assist Level: Minimal Assistance - Patient > 75%    Lower Body Dressing/Undressing Lower body dressing      What is the patient wearing?: Underwear/pull up, Hospital gown only     Lower body assist Assist for lower body dressing: Moderate Assistance - Patient 50 - 74%     Toileting Toileting     Toileting assist Assist for toileting: Supervision/Verbal cueing     Transfers Chair/bed transfer  Transfers assist     Chair/bed transfer assist level: Moderate Assistance - Patient 50 - 74%     Locomotion Ambulation   Ambulation assist      Assist level: Minimal Assistance - Patient > 75% Assistive device: Walker-rolling Max distance: 8 ft   Walk 10 feet activity   Assist  Walk 10 feet activity did not occur: Safety/medical concerns        Walk 50 feet activity   Assist Walk 50 feet with 2 turns activity did not occur: Safety/medical concerns         Walk 150 feet activity   Assist Walk 150 feet activity did not occur: Safety/medical concerns         Walk 10 feet on uneven surface  activity   Assist Walk 10 feet on uneven surfaces activity did not occur: Safety/medical concerns         Wheelchair     Assist Is the patient using a wheelchair?: Yes Type of Wheelchair: Manual    Wheelchair assist level: Dependent - Patient 0% Max wheelchair distance: 400 ft    Wheelchair 50 feet with 2 turns activity    Assist        Assist Level: Dependent - Patient 0%   Wheelchair 150 feet activity     Assist      Assist Level: Dependent - Patient 0%   Blood pressure (!) 124/108, pulse (!) 59, temperature 97.9 F (36.6 C), resp. rate 16, height 6' (1.829 m), weight (!) 153.6 kg, SpO2 100%.  Medical Problem List and Plan: 1. Functional deficits secondary to debility from MSSA bacteremia, c/b multiple medical comorbidities             -patient may shower with buttocks wound and lines covered             -ELOS/Goals: 11/26 , supervision PT/OT,   -Continue CIR therapies including PT, OT  2.  Antithrombotics: -DVT/anticoagulation:  Pharmaceutical: Eliquis 5 mg BID             -antiplatelet therapy: none   3. Pain Management: Tylenol as needed             -PO Dilaudid initiated on acute side due to nausea with oxycodone--> On  admission patient requests to switch back, changed back to oxycodone 15 mg Q4H PRN and Dced Dilaudid, Has only taken 4  x oxy IR 15mg  tabs in the last 48h 11/12              -   lidocaine cream, discussed pregabalin which pt declines at this time   -  have discussed etiology of numbness in legs.  -11/17 will adjust gabapentin to 300mg  at bedtime rather than just after HD               4. Mood/Behavior/Sleep: LCSW to evaluate and provide emotional support             -continue melatonin 5 mg q HS             -antipsychotic agents: n/a          -11/11 Start alprazolam 0.25mg  PRN BID for anxiety Neuropsych following pt  5. Neuropsych/cognition: This patient is capable of making decisions on his own behalf.              -     6. Skin/Wound Care: Routine skin care checks             -pressure injury left buttock/posterior thigh>>continue wound care              - Offered LAL mattress given HX pressure wounds; patient declines- RIght buttocks wound has been healing well with standard mattress   7. Fluids/Electrolytes/Nutrition: Routine Is and Os and follow-up chemistries -Renal diet with 1200 cc fluid restriction             -Continue Fosrenol 2000 mg 3 times daily with meals   8: Hypotension: monitor TID and prn             -continue midodrine 30 mg 3 times daily with meals Encourage patient to discuss dosing with nephrology. No current orthostatic drops on current dose    Vitals:   06/12/23 2019 06/13/23 0524  BP: (!) 93/58 (!) 124/108  Pulse: (!) 51 (!) 59  Resp: 16 16  Temp: 98.1 F (36.7 C) 97.9 F (36.6 C)  SpO2: 98% 100%   On M-W-F digoxin    9: pAF: on Eliquis              -unable to tolerate amiodarone (requires midodrine for BP so not a candidate for nodal agents)   10: Hypothyroidism: Continue Synthroid 25 mcg daily   11: Chronic systolic heart failure: EF ~30-35%             -continue digoxin 0.125 mg  3 x weekly             -daily weight             11/16 -volume  mgt>>HD 3 x weekly--pt refused Friday due to timing.   11/17 - took HD yesterday   -defer to renal regarding continuance of HD   12: Chronic hypoxic respiratory failure: on home O2 5 L via [place]   13: LE neuropathy/ R foot drop: CAM boot              - Pattern of weakness and sensory loss consistent with sciatic neuropathy above the knee (mild weakness  hamstrings); likely compressive from ICU stay  - hopeful for improvement and likely will need EMG as OP -11/11 Continue TED hoses patient reports this is helping his neuropathic pain.  Gabapentin dose limited by renal function pt feels dosing after HD is more helpful .  Could consider trying Lyrica instead of gabapentin if pain worsens   14: Morbid Obesity: Body mass index is 45.93 kg/m.    15: Anemia: on ESP   16: History or HIT positivity>>no heparin   17: MSSA bacteremia: will continue Ancef 2 g with HD treatment 3x/week (through 12/09)   18. Vocal hoarseness  s/p extubation. Consider ENT evaluation if no improvement.   11/16-discussed etiology and potential plan with pt today--likely outpt ENT referral if still symptomatic at that point 19.  ESRD on HD- nephro to manage     20. Insomnia  -  melatonin 5mg  somewhat helpful 21.  Right shoulder pain exam consistent with Right shoulder impingement syndrome vs tear- check xray  LOS: 10 days A FACE TO FACE EVALUATION WAS PERFORMED  Erick Colace 06/13/2023, 10:13 AM

## 2023-06-13 NOTE — Progress Notes (Signed)
Occupational Therapy Session Note  Patient Details  Name: Phillip Young MRN: [id number] Date of Birth: [birth year]  Today's Date: 06/13/2023 OT Individual Time: 1105-1200 OT Individual Time Calculation (min): 55 min    Short Term Goals: Week 2:  OT Short Term Goal 1 (Week 2): Pt will complete 3/3 toileting tasks wiht CGA using LRAD OT Short Term Goal 2 (Week 2): Pt will maintain standing balance during functional tasks >3 minutes OT Short Term Goal 3 (Week 2): Pt will complete LB dressing min A using AD and LRAD PRN  Skilled Therapeutic Interventions/Progress Updates:  Pt greeted seated in w/c, pt agreeable to OT intervention.    Pt on phone with mother at start of session. Pt requested to go outside for session.    Therapeutic activity: pt transported outside with total A for time mgmt and energy conservation. Worked on w/c propulsion outside across uneven surfaces with an emphasis on increasing BUE support and overall activity tolerance. Pt needed 5 rest breaks to complete ~ 30 ft of w/c propulsion.   Pt additionally completed below therex with level 3 theraband to facilitate improved activity tolerance and BUE strength for ADL participation, limited RUE therex d/t reports of pain especially with OH movement:   X10 shoulder flexion  X10 bicep curls X10 shoulder horizontal ABD X10 shoulder diagonal pulls X10 shoulder extension  X10 alternating punches  Pt additionally worked on community reintegration with pt requesting to stop by vending machine on the way back to his room. Pt able to reach buttons to type in food items from w/c level and retrieve items from dispenser with supervision.   Ended session with pt seated in w/c with all needs within reach.   Pt on 2L  during session.                 Therapy Documentation Precautions:  Precautions Precautions: Fall Precaution Comments: R sided weakness, R foot drop, O2 with activity, watch HR (has a-fib), Stage 3 wound on  buttock Required Braces or Orthoses: Other Brace Other Brace: R foot-up brace, or CAM boot Restrictions Weight Bearing Restrictions: No  Pain: unrated pain reported in RUE during session, decreased AROM during therex and provided rest breaks as needed.      Therapy/Group: Individual Therapy  Barron Schmid 06/13/2023, 12:11 PM

## 2023-06-13 NOTE — Progress Notes (Signed)
[facility] Progress Note   Subjective: Up in WC in room. Says he feels fine. HD tomorrow on T,Th,S schedule.   Objective Vitals:   06/12/23 0639 06/12/23 1311 06/12/23 2019 06/13/23 0524  BP: (!) 110/50 98/70 (!) 93/58 (!) 124/108  Pulse: 91 85 (!) 51 (!) 59  Resp: 17 19 16 16   Temp: 97.9 F (36.6 C) 98.1 F (36.7 C) 98.1 F (36.7 C) 97.9 F (36.6 C)  TempSrc:  Oral    SpO2: 100% 100% 98% 100%  Weight:      Height:       Physical Exam: General: Alert obese male NAD Heart: RRR no MRG Lungs: CTAB  Abdomen: Obese NABS soft NT ND Extremities: BLE edema Dialysis Access: [facility] intact    Additional Objective Labs: Basic Metabolic Panel: Recent Labs  Lab 06/06/23 1418 06/08/23 1116 06/10/23 1414 06/11/23 1348  NA 137 134* 134* 135  K 3.9 3.9 4.2 4.0  CL 97* 97* 94* 98  CO2 24 20* 24 22  GLUCOSE 97 105* 105* 105*  BUN 29* 25* 25* 35*  CREATININE 10.18* 9.32* 8.62* 10.18*  CALCIUM 10.1 10.5* 10.4* 10.0  PHOS 4.6 4.5 4.7*  --    Liver Function Tests: Recent Labs  Lab 06/06/23 1418 06/08/23 1116 06/10/23 1414  ALBUMIN 2.8* 3.2* 3.1*   No results for input(s): "LIPASE", "AMYLASE" in the last 168 hours. CBC: Recent Labs  Lab 06/06/23 1418 06/08/23 1116 06/10/23 1414 06/11/23 1348  WBC 9.3 8.2 7.7 7.0  NEUTROABS  --   --   --  4.0  HGB 10.3* 11.0* 11.1* 10.1*  HCT 33.2* 35.5* 36.1* 32.1*  MCV 100.3* 98.6 98.1 97.9  PLT 345 307 281 247   Blood Culture    Component Value Date/Time   SDES BLOOD BLOOD RIGHT HAND 05/27/2023 0959   SPECREQUEST  05/27/2023 0959    BOTTLES DRAWN AEROBIC AND ANAEROBIC Blood Culture adequate volume   CULT  05/27/2023 0959    NO GROWTH 5 DAYS Performed at [facility], [address] [place], [place] [address]    REPTSTATUS 06/01/2023 FINAL 05/27/2023 8756    Cardiac Enzymes: No results for input(s): "CKTOTAL", "CKMB", "CKMBINDEX", "TROPONINI" in the last 168 hours. CBG: No results for input(s):  "GLUCAP" in the last 168 hours. Iron Studies: No results for input(s): "IRON", "TIBC", "TRANSFERRIN", "FERRITIN" in the last 72 hours. @lablastinr3 @ Studies/Results: No results found. Medications:  anticoagulant sodium citrate      ceFAZolin (ANCEF) IV 2 g (06/12/23 1649)    apixaban  5 mg Oral BID   Chlorhexidine Gluconate Cloth  6 each Topical Q12H   cholecalciferol  400 Units Oral Daily   darbepoetin (ARANESP) injection - DIALYSIS  60 mcg Subcutaneous Q Sat-1800   digoxin  0.125 mg Oral Once per day on Monday Wednesday Friday   feeding supplement (NEPRO CARB STEADY)  237 mL Oral BID BM   gabapentin  300 mg Oral QHS   Gerhardt's butt cream   Topical TID   lanthanum  2,000 mg Oral TID WC   leptospermum manuka honey  1 Application Topical Daily   levothyroxine  25 mcg Oral Q0600   melatonin  5 mg Oral QHS   midodrine  30 mg Oral TID WC     OP  dialysis Orders: MWF at [facility] 4.5h   400/800    172kg   2K/2Ca bath   TDC   No heparin (+ HIT)   Assessment/Plan: 1. Acute hypoxic respiratory  failure: Remains on supplemental oxygen and ongoing efforts at continued ultrafiltration/lowering EDW with dialysis (anticipating that he has suffered weight loss during this hospitalization). He is stable at baseline O2 requirement, need standing weights 2. MSSA Bacteremia: TDC replaced after he underwent line holiday 05/26/23-05/30/23.  Continue cefazolin IV 2. ESRD: HD MWF schedule But changed  to TTS now As above. and utilizing sodium citrate for dialysis catheter lock given history of heparin-induced thrombocytopenia.  HD is refused HD as above, after today next dialysis Monday 3. HTN/volume: He remains on midodrine 30mg  TID, still having some soft blood pressures, continue current dose and will reeval to see if we can decrease dose daily 4. Anemia: Hgb 10.1 at goal, currently on  ESA 150 weekly.  Will decrease to 60 with Hgb over 10 5. Secondary hyperparathyroidism: Calcium level elevated and VDRA  remains on hold, phosphorus 4.7 =lanthanum  Phos binder 6. Nutrition:  Continue renal diet and protein supplements 7.  Deconditioning right foot drop- rehab  with CIR  Tayveon Lombardo H. Allyn Bartelson NP-C 06/13/2023, 11:56 AM  [employer] [phone]

## 2023-06-13 NOTE — Progress Notes (Signed)
Occupational Therapy Session Note  Patient Details  Name: Phillip Young MRN: [id number] Date of Birth: 1985/11/18  Today's Date: 06/13/2023 OT Individual Time: 6433-2951 OT Individual Time Calculation (min): 73 min    Short Term Goals: Week 2:  OT Short Term Goal 1 (Week 2): Pt will complete 3/3 toileting tasks wiht CGA using LRAD OT Short Term Goal 2 (Week 2): Pt will maintain standing balance during functional tasks >3 minutes OT Short Term Goal 3 (Week 2): Pt will complete LB dressing min A using AD and LRAD PRN  Skilled Therapeutic Interventions/Progress Updates:     Pt received sitting up in wc with RN present in room providing morning medications. Pt presenting to be in good spirits receptive to skilled OT session reporting 0/10 pain at rest- OT offering intermittent rest breaks, repositioning, and therapeutic support to optimize participation in therapy session. Pt maintained on 2L O2 throughout session with SPO2 remaining >97% throughout session. Focus this session strength and endurance training, dynamic standing balance, and therapeutic use of self.  Transported Pt total A to therapy gym in wc for time management and energy conservation.   From w/c, worked on sit<>stands to increase LB strength, endurance, and balance. Focused on anterior weight shifting to bring weight into B LEs and hand positioning. Pt mildly self limiting stating "my legs are not strong enough to boost me up to standing if I am not pushing up with both hands from my RW". (Pt tends to position B UEs on RW prior to sit<>stand, heavily rely on B UE to lift bottom from wc while in a forward flexed position, and then extend through hips to lift trunk while stabilizing self with B UEs). Maximal therapeutic support and encouragement provided with Pt receptive to trying sit<>stands using alternate hand method with single hand on wc arm rest and opposite hand on RW. Pt then able to complete sit<>stands x3 with CGA.    Engaged Pt in completing short distance functional mobility to EOM using RW with light min A provided for balance and RW management. Pt with decreased anxiety during short distance mobility this session, able to completed without a wc follow.   Engaged Pt in seated UE exercises to increase overall strength and endurance for BADLs. Pt noted to have decreased shoulder flexion and abduction with potential rotator cuff tear or impingement. Pt reporting his shoulder was not injured prior to hospitalization and he is unaware of etiology of injury. Informed MD of shoulder injury during session with MD planning to order x-ray and in/out during session for morning rounding.  -Bicep curls 5.5# weighted dowel 3x10 reps -Chest Press 5.5 weighted dowel 3x10 reps -Cross body punches with 1.5# wrist weighs 3x10 reps R/L Self-assisted shoulder flexion bodyweight only -Self-assisted overhead press bodyweight only  Pt completed dynamic standing balance activity using RW to increased [name] activity tolerance and dynamic standing balance. Pt instructed to maintain balance while reaching with single UE towards target, alternating R/L UE for increased lateral weight shifting challenges. Pt able to maintain dynamic standing balance ~2 minutes x3 trials during task with CGA provided for balance. Pt also able to intermittently released B UEs from RW for ~5 seconds maintaining balance CGA, however Pt very fearful releasing UEs from RW. Pt reporting that he did not feel well following activity stating "everything just feels heavy". Assessed vitals in siting position following physical activity BP 152/128(136) HR 80 PSO2 100%. Prolonged seated rest break provided with vitals reassessed in sitting position following ~5 minutes of  rest BP 97/70(78) HR HR 86 PSO2 100%  Informed RN of [name] status at end of session.   Pt completed stand step EOM>wc using RW CGA. Transported Pt back to room total A in wc for energy conservation. Pt was  left resting in wc with call bell in reach, seatbelt alarm on, and all needs met.    Therapy Documentation Precautions:  Precautions Precautions: Fall Precaution Comments: R sided weakness, R foot drop, O2 with activity, watch HR (has a-fib), Stage 3 wound on buttock Required Braces or Orthoses: Other Brace Other Brace: R foot-up brace, or CAM boot Restrictions Weight Bearing Restrictions: No  Therapy/Group: Individual Therapy  Clide Deutscher 06/13/2023, 7:55 AM

## 2023-06-13 NOTE — Progress Notes (Signed)
Physical Therapy Session Note  Patient Details  Name: Phillip Young MRN: [id number] Date of Birth: 06-27-1986  Today's Date: 06/13/2023 PT Individual Time: 0800-0906 PT Individual Time Calculation (min): 66 min   Short Term Goals: Week 2:  PT Short Term Goal 1 (Week 2): =LTG's  Skilled Therapeutic Interventions/Progress Updates:      Pt supine in bed upon arrival. Pt agreeable to therapy. Pt reports 7/10 B LE/feet pain, premedicated.  Pt performed supine to sit with use of bed features and supervision.   Pt breakfast delivered at start of session. Pt sitting EOB while therapist donned pants and shoes with total A per pt request. Pt donned shirt while sitting EOB with mod I.   Dicussed foot up brace, pt reports he is currently wearing crocs with ace wrap until his family brings him better shoes.   Pt performed sit to stand from elevated bed with RW and CGA, pt donned pants with min A to get pants from ankles to knees and CGA for mild posterior LOB, pt provided encouragement and reassurance 2/2 fear of falling and anxiety.   Pt transported dependent in [facility] to room for time/energy conservation. Pt ambulated 20 feet with RW and CGA, with WC in tow and therapist managing oxygen, verbal cues provided for breathing in through nose and out through mouth. RA O2 97% on 2L O2.   Pt performed sit to stand throughout session with CGA, verbal cues provided for UE positioning for controlled descent with stand to sit. Pt performed unilateral overhead reach x10 on each side, verbal cues provided for not holding breath, pt reports "malaise" and "brain fog" upon completion.  Vitals Assessed: while seated in WC BP 135/115 HR 50,2L O2 100, nursing and PA notified, reassessed post ~5 min while seated in WC 105/61 HR 83 2L O2 100.   Pt seated in [facility] with all needs within reach and seatbelt alarm on at end of session.      Therapy Documentation Precautions:  Precautions Precautions: Fall Precaution  Comments: R sided weakness, R foot drop, O2 with activity, watch HR (has a-fib), Stage 3 wound on buttock Required Braces or Orthoses: Other Brace Other Brace: R foot-up brace, or CAM boot Restrictions Weight Bearing Restrictions: No  Therapy/Group: Individual Therapy  [facility] Ambrose Finland, [place], DPT  06/13/2023, 7:44 AM

## 2023-06-14 ENCOUNTER — Inpatient Hospital Stay (HOSPITAL_COMMUNITY): Payer: Medicaid Other

## 2023-06-14 DIAGNOSIS — I5022 Chronic systolic (congestive) heart failure: Secondary | ICD-10-CM | POA: Diagnosis not present

## 2023-06-14 DIAGNOSIS — G5701 Lesion of sciatic nerve, right lower limb: Secondary | ICD-10-CM | POA: Diagnosis not present

## 2023-06-14 DIAGNOSIS — R5381 Other malaise: Secondary | ICD-10-CM | POA: Diagnosis not present

## 2023-06-14 DIAGNOSIS — N186 End stage renal disease: Secondary | ICD-10-CM | POA: Diagnosis not present

## 2023-06-14 LAB — CBC
HCT: 35 % — ABNORMAL LOW (ref 39.0–52.0)
Hemoglobin: 10.8 g/dL — ABNORMAL LOW (ref 13.0–17.0)
MCH: 30.9 pg (ref 26.0–34.0)
MCHC: 30.9 g/dL (ref 30.0–36.0)
MCV: 100 fL (ref 80.0–100.0)
Platelets: 228 10*3/uL (ref 150–400)
RBC: 3.5 MIL/uL — ABNORMAL LOW (ref 4.22–5.81)
RDW: 15.4 % (ref 11.5–15.5)
WBC: 5.4 10*3/uL (ref 4.0–10.5)
nRBC: 0 % (ref 0.0–0.2)

## 2023-06-14 LAB — RENAL FUNCTION PANEL
Albumin: 3 g/dL — ABNORMAL LOW (ref 3.5–5.0)
Anion gap: 14 (ref 5–15)
BUN: 41 mg/dL — ABNORMAL HIGH (ref 6–20)
CO2: 22 mmol/L (ref 22–32)
Calcium: 9.7 mg/dL (ref 8.9–10.3)
Chloride: 100 mmol/L (ref 98–111)
Creatinine, Ser: 10.99 mg/dL — ABNORMAL HIGH (ref 0.61–1.24)
GFR, Estimated: 6 mL/min — ABNORMAL LOW (ref >60)
Glucose, Bld: 90 mg/dL (ref 70–99)
Phosphorus: 6.5 mg/dL — ABNORMAL HIGH (ref 2.5–4.6)
Potassium: 5.3 mmol/L — ABNORMAL HIGH (ref 3.5–5.1)
Sodium: 136 mmol/L (ref 135–145)

## 2023-06-14 LAB — HEPATITIS B SURFACE ANTIBODY, QUANTITATIVE: Hep B S AB Quant (Post): 85.4 m[IU]/mL

## 2023-06-14 MED ORDER — PENTAFLUOROPROP-TETRAFLUOROETH EX AERO: 1.0000 | INHALATION_SPRAY | CUTANEOUS | Status: DC | PRN

## 2023-06-14 MED ORDER — DIAZEPAM 5 MG PO TABS
10.0000 mg | ORAL_TABLET | Freq: Once | ORAL | Status: AC
  Administered 2023-06-14: 10 mg via ORAL
  Filled 2023-06-14: qty 2

## 2023-06-14 MED ORDER — ALTEPLASE 2 MG IJ SOLR: 2.0000 mg | Freq: Once | INTRAMUSCULAR | Status: DC | PRN

## 2023-06-14 MED ORDER — ANTICOAGULANT SODIUM CITRATE 4% (200MG/5ML) IV SOLN: 5.0000 mL | Status: DC | PRN

## 2023-06-14 MED ORDER — LIDOCAINE HCL (PF) 1 % IJ SOLN: 5.0000 mL | INTRAMUSCULAR | Status: DC | PRN

## 2023-06-14 NOTE — Progress Notes (Signed)
Occupational Therapy Session Note  Patient Details  Name: Phillip Young MRN: [id number] Date of Birth: [birth year]  Today's Date: 06/14/2023 OT Individual Time: 0847-1006 +1334-1439 ( 65 mins) OT Individual Time Calculation (min): 79 min    Short Term Goals: Week 2:  OT Short Term Goal 1 (Week 2): Pt will complete 3/3 toileting tasks wiht CGA using LRAD OT Short Term Goal 2 (Week 2): Pt will maintain standing balance during functional tasks >3 minutes OT Short Term Goal 3 (Week 2): Pt will complete LB dressing min A using AD and LRAD PRN  Skilled Therapeutic Interventions/Progress Updates:   Session 1:  Pt greeted seated EOB, pt agreeable to OT intervention, but reports nausea, LPN present with morning meds, zofran given however pt did have emesis episode from EOB, emesis bags provided as well as ginger ale. Pt feels like the [place] sandwiches from the cafeteria are making him sick as he had one yesterday and had same nausea feeling.   Pt initially asked to be on 4L during session but able to titrate down to 2L. Pt seems to request increased O2 with anxiety.   Transfers/bed mobility: pt completed sit>stand from elevated EOB with Rw and CGA. Pt completed ambulatory transfer into bathroom with Rw and CGA.    ADLs:  Grooming: pt requested therapist to pour mouthwash into pts despite after emesis episode as pt reported feeling "unsteady." Pt able to stand at sink with BUE support for oral care.  UB dressing:pt donned OH shirt with set- up assist LB dressing: pt donned pants and boxers with CGA with + time Footwear: pt able to don slide on shoes with set- up assist and + time d/t pain in bilateral feet   Bathing: pt completed bathing seated on shower seat with close supervision.  Transfers: ambulatory transfer into bathroom for showering with RW and CGA, pt exited bathroom in same manner.  Ended session with pt seated in w/c with all needs within reach.           Session 2: Pt greeted  seated EOB , pt agreeable to OT intervention.      Transfers/bed mobility: pt completed sit>stand from highly elevated EOB with CGA. Suggested that pt attempt to stand from lower surface to facilitate improved Lb strength/endurance but pt reports he has a hospital bed at home and will be able to do the same thing at home.  Pt completed stand pivot from EOB>w/c with RW and CGA.  Therapeutic activity:worked on sit>stands from w/c using connect 4 game to facilitate improved LB strength and endurance with pt able to  complete x6 sit>stands with CGA. Pt on 2L [place] during task with SpO2 briefly dropping to 88% but able to rebound to >90% with pursed lip breathing.   Additionally worked on standing tolerance using connect 4 game with pt able to stand for 2 mins to engage in game before needing to sit down.   Attempted to work on short distance functional mobility with pt instructed to ambulate ~63ft to match rings to squigz on mirror however pt completed ~ 5 ft and then states "I'm panicking" needing to sit down.   Pt then proceeds to disclose that he is having a hard time dealing with new anxiety and his "new normal." Provide emotional support and education on recovery vs resilience as pt continuing to perseverate on "I used to never be like this."   Ended session with pt seated in w/c with all needs within reach.  Therapy Documentation Precautions:  Precautions Precautions: Fall Precaution Comments: R sided weakness, R foot drop, O2 with activity, watch HR (has a-fib), Stage 3 wound on buttock Required Braces or Orthoses: Other Brace Other Brace: R foot-up brace, or CAM boot Restrictions Weight Bearing Restrictions: No  Pain: Session 1: unrated pain reported from nausea, however pt reports feeling "much better" after emesis episode.  Session 2: no pain reported during session     Therapy/Group: Individual Therapy  Pollyann Glen [facility] 06/14/2023, 11:59 AM

## 2023-06-14 NOTE — Progress Notes (Signed)
[facility] Progress Note   Subjective: Refusing to come to HD during day, wanting to manipulate treatment day. Fortunately discharge date is 06/20/2023   Objective Vitals:   06/13/23 0524 06/13/23 1319 06/13/23 2135 06/14/23 0526  BP: (!) 124/108 (!) 100/46 118/70 95/67  Pulse: (!) 59 92 (!) 50 86  Resp: 16 19 16 18   Temp: 97.9 F (36.6 C) 97.8 F (36.6 C) 98.4 F (36.9 C) 98.1 F (36.7 C)  TempSrc:  Oral    SpO2: 100% 100% 96% 99%  Weight:    (!) 155.9 kg  Height:       Physical Exam: General: Alert obese male NAD Heart: RRR no MRG Lungs: CTAB  Abdomen: Obese NABS soft NT ND Extremities: BLE edema Dialysis Access: [facility] intact    Additional Objective Labs: Basic Metabolic Panel: Recent Labs  Lab 06/08/23 1116 06/10/23 1414 06/11/23 1348 06/14/23 1153  NA 134* 134* 135 136  K 3.9 4.2 4.0 5.3*  CL 97* 94* 98 100  CO2 20* 24 22 22   GLUCOSE 105* 105* 105* 90  BUN 25* 25* 35* 41*  CREATININE 9.32* 8.62* 10.18* 10.99*  CALCIUM 10.5* 10.4* 10.0 9.7  PHOS 4.5 4.7*  --  6.5*   Liver Function Tests: Recent Labs  Lab 06/08/23 1116 06/10/23 1414 06/14/23 1153  ALBUMIN 3.2* 3.1* 3.0*   No results for input(s): "LIPASE", "AMYLASE" in the last 168 hours. CBC: Recent Labs  Lab 06/08/23 1116 06/10/23 1414 06/11/23 1348 06/14/23 1153  WBC 8.2 7.7 7.0 5.4  NEUTROABS  --   --  4.0  --   HGB 11.0* 11.1* 10.1* 10.8*  HCT 35.5* 36.1* 32.1* 35.0*  MCV 98.6 98.1 97.9 100.0  PLT 307 281 247 228   Blood Culture    Component Value Date/Time   SDES BLOOD BLOOD RIGHT HAND 05/27/2023 0959   SPECREQUEST  05/27/2023 0959    BOTTLES DRAWN AEROBIC AND ANAEROBIC Blood Culture adequate volume   CULT  05/27/2023 0959    NO GROWTH 5 DAYS Performed at [facility], [address] [place], [place] [address]    REPTSTATUS 06/01/2023 FINAL 05/27/2023 7616    Cardiac Enzymes: No results for input(s): "CKTOTAL", "CKMB", "CKMBINDEX", "TROPONINI" in the  last 168 hours. CBG: No results for input(s): "GLUCAP" in the last 168 hours. Iron Studies: No results for input(s): "IRON", "TIBC", "TRANSFERRIN", "FERRITIN" in the last 72 hours. @lablastinr3 @ Studies/Results: DG Shoulder 1V Right  Result Date: 06/13/2023 CLINICAL DATA:  Right shoulder pain common no known injury, initial encounter EXAM: RIGHT SHOULDER - 1 VIEW COMPARISON:  05/24/2023 FINDINGS: Mild degenerative changes of the acromioclavicular joint are seen. No acute fracture or dislocation is noted. Dialysis catheter is again noted. IMPRESSION: Mild degenerative changes of the acromioclavicular joint. Electronically Signed   By: Alcide Clever M.D.   On: 06/13/2023 20:07   Medications:  anticoagulant sodium citrate     anticoagulant sodium citrate      ceFAZolin (ANCEF) IV      apixaban  5 mg Oral BID   Chlorhexidine Gluconate Cloth  6 each Topical Q12H   cholecalciferol  400 Units Oral Daily   darbepoetin (ARANESP) injection - DIALYSIS  60 mcg Subcutaneous Q Sat-1800   digoxin  0.125 mg Oral Once per day on Monday Wednesday Friday   feeding supplement (NEPRO CARB STEADY)  237 mL Oral BID BM   gabapentin  300 mg Oral QHS   Gerhardt's butt cream   Topical TID  lanthanum  2,000 mg Oral TID WC   leptospermum manuka honey  1 Application Topical Daily   levothyroxine  25 mcg Oral Q0600   melatonin  5 mg Oral QHS   midodrine  30 mg Oral TID WC     OP  dialysis Orders: MWF at [facility] 4.5h   400/800    172kg   2K/2Ca bath   TDC   No heparin (+ HIT)   Assessment/Plan: 1. Acute hypoxic respiratory failure: Remains on supplemental oxygen and ongoing efforts at continued ultrafiltration/lowering EDW with dialysis (anticipating that he has suffered weight loss during this hospitalization). He is stable at baseline O2 requirement, need standing weights 2. MSSA Bacteremia: TDC replaced after he underwent line holiday 05/26/23-05/30/23.  Continue cefazolin IV 2. ESRD: HD MWF schedule But changed   to TTS now As above. and utilizing sodium citrate for dialysis catheter lock given history of heparin-induced thrombocytopenia.  HD is refused HD as above, after today next dialysis Monday 3. HTN/volume: He remains on midodrine 30mg  TID, still having some soft blood pressures, continue current dose and will reeval to see if we can decrease dose daily 4. Anemia: Hgb 10.1 at goal, currently on  ESA 150 weekly.  Will decrease to 60 with Hgb over 10 5. Secondary hyperparathyroidism: Calcium level elevated and VDRA remains on hold, phosphorus 4.7 lanthanum  Phos binder 6. Nutrition:  Continue renal diet and protein supplements 7.  Deconditioning right foot drop- rehab  with CIR  Disposition: Planning to DC 06/20/2023. Will be on MWF schedule at [facility]. Riddick Nuon NP-C 06/14/2023, 1:53 PM  [employer] [phone]

## 2023-06-14 NOTE — Progress Notes (Signed)
Patient ID: Phillip Young, male   DOB: July 14, 1986, 37 y.o.   MRN: [id number]  Sw left VM with mother to discuss change in d/c date due to HD.

## 2023-06-14 NOTE — Progress Notes (Signed)
Per Ladona Horns, EN patient wants to come after 1730.  Carson Myrtle

## 2023-06-14 NOTE — Progress Notes (Signed)
Patient ID: Phillip Young, male   DOB: [birth year], 37 y.o.   MRN: [id number]  Benjie Karvonen ordered through [employer].

## 2023-06-14 NOTE — Progress Notes (Signed)
PROGRESS NOTE   Subjective/Complaints:  Appreciate nephro note , RIght shoulder pain with overhead reaching as well as limited ROM- Xray showing DJD right AC jt , gives hx of onset after pronged ICU stay 3-4 mo ago  ROS: Patient denies SOB, DOE, feels needs to increase O2 at noc , No GI distress      Objective:   DG Shoulder 1V Right  Result Date: 06/13/2023 CLINICAL DATA:  Right shoulder pain common no known injury, initial encounter EXAM: RIGHT SHOULDER - 1 VIEW COMPARISON:  05/24/2023 FINDINGS: Mild degenerative changes of the acromioclavicular joint are seen. No acute fracture or dislocation is noted. Dialysis catheter is again noted. IMPRESSION: Mild degenerative changes of the acromioclavicular joint. Electronically Signed   By: Alcide Clever M.D.   On: 06/13/2023 20:07   Recent Labs    06/11/23 1348  WBC 7.0  HGB 10.1*  HCT 32.1*  PLT 247    Recent Labs    06/11/23 1348  NA 135  K 4.0  CL 98  CO2 22  GLUCOSE 105*  BUN 35*  CREATININE 10.18*  CALCIUM 10.0     Intake/Output Summary (Last 24 hours) at 06/14/2023 0849 Last data filed at 06/13/2023 1820 Gross per 24 hour  Intake 0 ml  Output --  Net 0 ml     Pressure Injury 05/18/23 Buttocks Right Stage 3 -  Full thickness tissue loss. Subcutaneous fat may be visible but bone, tendon or muscle are NOT exposed. (Active)  05/18/23 2300  Location: Buttocks  Location Orientation: Right  Staging: Stage 3 -  Full thickness tissue loss. Subcutaneous fat may be visible but bone, tendon or muscle are NOT exposed.  Wound Description (Comments):   Present on Admission: Yes (original date 05/18/23; present on admit to CIR 06/03/23)     Pressure Injury 05/18/23 Buttocks Left Stage 2 -  Partial thickness loss of dermis presenting as a shallow open injury with a red, pink wound bed without slough. (Active)  05/18/23 2300  Location: Buttocks  Location Orientation:  Left  Staging: Stage 2 -  Partial thickness loss of dermis presenting as a shallow open injury with a red, pink wound bed without slough.  Wound Description (Comments):   Present on Admission: Yes (original date 05/18/23; present on admit to CIR 06/03/23)    Physical Exam: Vital Signs Blood pressure 95/67, pulse 86, temperature 98.1 F (36.7 C), resp. rate 18, height 6' (1.829 m), weight (!) 155.9 kg, SpO2 99%.   General: No acute distress Mood and affect are appropriate Heart: Regular rate and rhythm no rubs murmurs or extra sounds Lungs: Clear to auscultation, breathing unlabored, no rales or wheezes Abdomen: Positive bowel sounds, soft nontender to palpation, nondistended Extremities: No clubbing, cyanosis, or edema   L/R buttock wounds dressed. Neurologic: Cranial nerves II through XII intact, motor strength is 5/5 in bilateral deltoid, bicep, tricep, grip, 0/5 right  ankle dorsiflexor and plantar flexor 5/5 RIght quad 4/5 R hamstring, 5/5 left quad and hamstring and ankle  Sensory exam absent LT sensation RIght foot/ankle , no proprio Right toes , partial proprio at R ankle ,   Left foot intact sensation LT--motor and  sensory exam essentially unchanged 11/17   Musculoskeletal:+ drop arm test on Right side , + Impingement with abd R side at 90 deg Is tight at end range with abd and ext rotation and arm flexion  Assessment/Plan: 1. Functional deficits which require 3+ hours per day of interdisciplinary therapy in a comprehensive inpatient rehab setting. Physiatrist is providing close team supervision and 24 hour management of active medical problems listed below. Physiatrist and rehab team continue to assess barriers to discharge/monitor patient progress toward functional and medical goals  Care Tool:  Bathing    Body parts bathed by patient: Right arm, Left arm, Chest, Abdomen, Front perineal area, Face   Body parts bathed by helper: Left lower leg, Right lower leg, Left  upper leg, Buttocks, Right upper leg     Bathing assist Assist Level: Supervision/Verbal cueing     Upper Body Dressing/Undressing Upper body dressing   What is the patient wearing?: Hospital gown only    Upper body assist Assist Level: Minimal Assistance - Patient > 75%    Lower Body Dressing/Undressing Lower body dressing      What is the patient wearing?: Underwear/pull up, Hospital gown only     Lower body assist Assist for lower body dressing: Moderate Assistance - Patient 50 - 74%     Toileting Toileting    Toileting assist Assist for toileting: Supervision/Verbal cueing     Transfers Chair/bed transfer  Transfers assist     Chair/bed transfer assist level: Moderate Assistance - Patient 50 - 74%     Locomotion Ambulation   Ambulation assist      Assist level: Minimal Assistance - Patient > 75% Assistive device: Walker-rolling Max distance: 8 ft   Walk 10 feet activity   Assist  Walk 10 feet activity did not occur: Safety/medical concerns        Walk 50 feet activity   Assist Walk 50 feet with 2 turns activity did not occur: Safety/medical concerns         Walk 150 feet activity   Assist Walk 150 feet activity did not occur: Safety/medical concerns         Walk 10 feet on uneven surface  activity   Assist Walk 10 feet on uneven surfaces activity did not occur: Safety/medical concerns         Wheelchair     Assist Is the patient using a wheelchair?: Yes Type of Wheelchair: Manual    Wheelchair assist level: Dependent - Patient 0% Max wheelchair distance: 400 ft    Wheelchair 50 feet with 2 turns activity    Assist        Assist Level: Dependent - Patient 0%   Wheelchair 150 feet activity     Assist      Assist Level: Dependent - Patient 0%   Blood pressure 95/67, pulse 86, temperature 98.1 F (36.7 C), resp. rate 18, height 6' (1.829 m), weight (!) 155.9 kg, SpO2 99%.  Medical Problem List  and Plan: 1. Functional deficits secondary to debility from MSSA bacteremia, c/b multiple medical comorbidities             -patient may shower with buttocks wound and lines covered             -ELOS/Goals: 11/26 , supervision PT/OT,   -Continue CIR therapies including PT, OT  2.  Antithrombotics: -DVT/anticoagulation:  Pharmaceutical: Eliquis 5 mg BID             -antiplatelet therapy: none  3. Pain Management: Tylenol as needed             -PO Dilaudid initiated on acute side due to nausea with oxycodone--> On admission patient requests to switch back, changed back to oxycodone 15 mg Q4H PRN and Dced Dilaudid, Increasing Oxy use to TID               -   lidocaine cream, discussed pregabalin which pt declines at this time   -have discussed etiology of numbness in legs.  -11/17 will adjust gabapentin to 300mg  at bedtime rather than just after HD               4. Mood/Behavior/Sleep: LCSW to evaluate and provide emotional support             -continue melatonin 5 mg q HS             -antipsychotic agents: n/a          -11/11 Start alprazolam 0.25mg  PRN BID for anxiety Neuropsych following pt  5. Neuropsych/cognition: This patient is capable of making decisions on his own behalf.              -     6. Skin/Wound Care: Routine skin care checks             -pressure injury left buttock/posterior thigh>>continue wound care              - Offered LAL mattress given HX pressure wounds; patient declines- RIght buttocks wound has been healing well with standard mattress   7. Fluids/Electrolytes/Nutrition: Routine Is and Os and follow-up chemistries -Renal diet with 1200 cc fluid restriction             -Continue Fosrenol 2000 mg 3 times daily with meals   8: Hypotension: monitor TID and prn             -continue midodrine 30 mg 3 times daily with meals Encourage patient to discuss dosing with nephrology. No current orthostatic drops on current dose    Vitals:   06/13/23 2135 06/14/23  0526  BP: 118/70 95/67  Pulse: (!) 50 86  Resp: 16 18  Temp: 98.4 F (36.9 C) 98.1 F (36.7 C)  SpO2: 96% 99%      9: pAF: on Eliquis              -unable to tolerate amiodarone (requires midodrine for BP so not a candidate for nodal agents)On M-W-F digoxin    10: Hypothyroidism: Continue Synthroid 25 mcg daily   11: Chronic systolic heart failure: EF ~30-35%             -continue digoxin 0.125 mg  3 x weekly             -daily weight             11/16 -volume mgt>>HD 3 x weekly--pt refused Friday due to timing.  Now on Tu Th Sa    12: Chronic hypoxic respiratory failure: on home O2 5 L via [place]   13: LE neuropathy/ R foot drop: CAM boot              - Pattern of weakness and sensory loss consistent with sciatic neuropathy above the knee (mild weakness  hamstrings); likely compressive from ICU stay  - hopeful for improvement and likely will need EMG as OP -11/11 Continue TED hoses patient reports this is helping his neuropathic pain.  Gabapentin dose limited by  renal function pt feels dosing after HD is more helpful .  Could consider trying Lyrica instead of gabapentin if pain worsens   14: Morbid Obesity: Body mass index is 46.61 kg/m.    15: Anemia: on ESP   16: History or HIT positivity>>no heparin   17: MSSA bacteremia: will continue Ancef 2 g with HD treatment 3x/week (through 12/09)   18. Vocal hoarseness s/p extubation. Consider ENT evaluation if no improvement.   11/16-discussed etiology and potential plan with pt today--likely outpt ENT referral if still symptomatic at that point 19.  ESRD on HD- nephro to manage     20. Insomnia  -  melatonin 5mg  somewhat helpful 21.  Right shoulder pain exam consistent with Right shoulder impingement syndrome vs tear-  xray shows mild R AC jt OA.  DDx is frozen shoulder vs RCT LOS: 11 days A FACE TO FACE EVALUATION WAS PERFORMED  Erick Colace 06/14/2023, 8:49 AM

## 2023-06-14 NOTE — Progress Notes (Signed)
[name] case discussed with rehab CSW. [name] out-pt HD clinic [facility]) is changing pts HD days next week due to Thanksgiving holiday. MWF pts are to receive out-pt HD on Sunday, Tuesday, and Friday. Appt times will remain the same. This info was provided to CSW due to pt's target d/c date is set for Tuesday, 11/26 and that will be an out-pt HD day for pt. Staff made aware of this as well due to pt's transportation to/from HD will need to be arranged accordingly based on d/c date and change in HD days. Renal NP aware of the above info as well. Will assist as needed.   Olivia Canter Renal Navigator [phone]

## 2023-06-14 NOTE — Progress Notes (Signed)
Physical Therapy Session Note  Patient Details  Name: Phillip Young MRN: [id number] Date of Birth: 1986-04-20  Today's Date: 06/14/2023 PT Individual Time: 4098-1191 PT Individual Time Calculation (min): 78 min   Short Term Goals: Week 2:  PT Short Term Goal 1 (Week 2): =LTG's  Skilled Therapeutic Interventions/Progress Updates:    Pt received in WC, alarm activated, agreeable to PT. Pt reports he was able to eat a lot for lunch, but now he does not feel good given he has not eaten that amount in weeks.   Pt on 4L wall O2 via nasal cannula transitioned to portable tank.  PT assist nursing w/ dressing change on glute.  Pt demo difficulty during first 4 STS, minA using B armrests. PT educated pt on proper technique for STS after he reported STS feel much harder today. Pt was able to use visual and verbal education given by PT to demo improved STS mechanics rest of session, CGA w/ momentum and use of armrests B for power up. Addition of tactile cues at B glutes to assist forward movement during transfer. Pt demo poor eccentric control of standing to sitting transfer.   Gait training: RW, CGA +2 for WC tow d/t quick need to sit. Use of R DF assist ace wrap. - 23 ft , 20 ft, & 11 ft - 15 ft w/ addition of 70 bpm metronome. - 14 ft & 14 ft w// 65 bpm metronome. - Pt ambulates w/ the following gait deviations: decreased step length and height, decreased velocity, intermittent standing rest breaks while in the middle of stride, increased UE support, intermittent uncontrolled jerk of RW for management, forward flexed. - Pt requires increased motivational encouragement throughout all gait trials. Pt demo momentarily improved velocity for ~ 3 feet but panics and needs to sit quickly. Pt demo improvements in gait velocity as a whole w/ the use of metronome despite not being exactly on beat.  TotalA doffing LE dressing and shoes sitting on EOB. Bed mobility modA for B LE management d/t fatigue and  pain.   Pt left in bed, alarm activated, all needs met.  Therapy Documentation Precautions:  Precautions Precautions: Fall Precaution Comments: R sided weakness, R foot drop, O2 with activity, watch HR (has a-fib), Stage 3 wound on buttock Required Braces or Orthoses: Other Brace Other Brace: R foot-up brace, or CAM boot Restrictions Weight Bearing Restrictions: No Pain:  Pt reports 8/10 low back pain during session. Nursing notified and was able to bring pain meds.  Therapy/Group: Individual Therapy  CRASH LONTZ 06/14/2023, 7:57 AM

## 2023-06-14 NOTE — Progress Notes (Signed)
Patient ID: Phillip Young, male   DOB: [birth year], 37 y.o.   MRN: [id number]  Due to HD schedule of T, TH, Sat at discharge. Patient discharge moved to 06/20/23.

## 2023-06-15 ENCOUNTER — Inpatient Hospital Stay (HOSPITAL_COMMUNITY): Payer: Medicaid Other

## 2023-06-15 ENCOUNTER — Encounter: Payer: BC Managed Care – PPO | Admitting: Student

## 2023-06-15 DIAGNOSIS — N186 End stage renal disease: Secondary | ICD-10-CM | POA: Diagnosis not present

## 2023-06-15 DIAGNOSIS — G5701 Lesion of sciatic nerve, right lower limb: Secondary | ICD-10-CM | POA: Diagnosis not present

## 2023-06-15 DIAGNOSIS — R5381 Other malaise: Secondary | ICD-10-CM | POA: Diagnosis not present

## 2023-06-15 DIAGNOSIS — I5022 Chronic systolic (congestive) heart failure: Secondary | ICD-10-CM | POA: Diagnosis not present

## 2023-06-15 LAB — BRAIN NATRIURETIC PEPTIDE: B Natriuretic Peptide: 485.6 pg/mL — ABNORMAL HIGH (ref 0.0–100.0)

## 2023-06-15 MED ORDER — ANTICOAGULANT SODIUM CITRATE 4% (200MG/5ML) IV SOLN
5.0000 mL | Freq: Once | Status: AC
  Administered 2023-06-15: 5 mL
  Filled 2023-06-15: qty 5

## 2023-06-15 MED ORDER — CHLORHEXIDINE GLUCONATE CLOTH 2 % EX PADS: 6.0000 | MEDICATED_PAD | Freq: Every day | CUTANEOUS | Status: DC

## 2023-06-15 MED ORDER — GABAPENTIN 300 MG PO CAPS
300.0000 mg | ORAL_CAPSULE | Freq: Every day | ORAL | Status: DC
Start: 2023-06-15 — End: 2023-06-16
  Administered 2023-06-15: 300 mg via ORAL
  Filled 2023-06-15 (×2): qty 1

## 2023-06-15 NOTE — Progress Notes (Signed)
PROGRESS NOTE   Subjective/Complaints:  Discussed Right shoulder MRI with pt , his uncle is at bedside   CXR ordered per Dr Arlean Hopping   ROS: Patient denies SOB, DOE, feels needs to increase O2 at noc , No GI distress      Objective:   MR SHOULDER RIGHT WO CONTRAST  Result Date: 06/14/2023 CLINICAL DATA:  Chronic right shoulder pain. EXAM: MRI OF THE RIGHT SHOULDER WITHOUT CONTRAST TECHNIQUE: Multiplanar, multisequence MR imaging of the shoulder was performed. No intravenous contrast was administered. COMPARISON:  Right shoulder x-rays from yesterday. FINDINGS: Rotator cuff: Intact rotator cuff. Mild distal supraspinatus tendinosis. Muscles: No atrophy or abnormal signal of the muscles of the rotator cuff. Biceps long head:  Intact and normally positioned. Acromioclavicular Joint: Normal acromioclavicular joint. Os acromiale without significant degenerative changes. Glenohumeral Joint: No joint effusion. No chondral defect. Labrum: Grossly intact, but evaluation is limited by lack of intraarticular fluid. Bones:  No marrow abnormality, fracture or dislocation. Other: Partial loss normal fat within the rotator interval. Mild soft tissue swelling in the axillary recess. IMPRESSION: 1. Mild distal supraspinatus tendinosis. No rotator cuff tear. 2. Os acromiale without significant degenerative changes. 3. Findings which can be seen with adhesive capsulitis. Electronically Signed   By: Obie Dredge M.D.   On: 06/14/2023 17:22   DG Shoulder 1V Right  Result Date: 06/13/2023 CLINICAL DATA:  Right shoulder pain common no known injury, initial encounter EXAM: RIGHT SHOULDER - 1 VIEW COMPARISON:  05/24/2023 FINDINGS: Mild degenerative changes of the acromioclavicular joint are seen. No acute fracture or dislocation is noted. Dialysis catheter is again noted. IMPRESSION: Mild degenerative changes of the acromioclavicular joint. Electronically  Signed   By: Alcide Clever M.D.   On: 06/13/2023 20:07   Recent Labs    06/14/23 1153  WBC 5.4  HGB 10.8*  HCT 35.0*  PLT 228    Recent Labs    06/14/23 1153  NA 136  K 5.3*  CL 100  CO2 22  GLUCOSE 90  BUN 41*  CREATININE 10.99*  CALCIUM 9.7     Intake/Output Summary (Last 24 hours) at 06/15/2023 0952 Last data filed at 06/15/2023 0804 Gross per 24 hour  Intake 248.8 ml  Output 3700 ml  Net -3451.2 ml     Pressure Injury 05/18/23 Buttocks Right Stage 3 -  Full thickness tissue loss. Subcutaneous fat may be visible but bone, tendon or muscle are NOT exposed. (Active)  05/18/23 2300  Location: Buttocks  Location Orientation: Right  Staging: Stage 3 -  Full thickness tissue loss. Subcutaneous fat may be visible but bone, tendon or muscle are NOT exposed.  Wound Description (Comments):   Present on Admission: Yes (original date 05/18/23; present on admit to CIR 06/03/23)     Pressure Injury 05/18/23 Buttocks Left Stage 2 -  Partial thickness loss of dermis presenting as a shallow open injury with a red, pink wound bed without slough. (Active)  05/18/23 2300  Location: Buttocks  Location Orientation: Left  Staging: Stage 2 -  Partial thickness loss of dermis presenting as a shallow open injury with a red, pink wound bed without slough.  Wound Description (Comments):  Present on Admission: Yes (original date 05/18/23; present on admit to CIR 06/03/23)    Physical Exam: Vital Signs Blood pressure (!) 135/97, pulse (!) 105, temperature 99.2 F (37.3 C), resp. rate 20, height 6' (1.829 m), weight (!) 152.2 kg, SpO2 100%.   General: No acute distress Mood and affect are appropriate Heart: Regular rate and rhythm no rubs murmurs or extra sounds Lungs: Clear to auscultation, breathing unlabored, no rales or wheezes Abdomen: Positive bowel sounds, soft nontender to palpation, nondistended Extremities: No clubbing, cyanosis, or edema   L/R buttock wounds  dressed. Neurologic: Cranial nerves II through XII intact, motor strength is 5/5 in bilateral deltoid, bicep, tricep, grip, 0/5 right  ankle dorsiflexor and plantar flexor 5/5 RIght quad 4/5 R hamstring, 5/5 left quad and hamstring and ankle  Sensory exam absent LT sensation RIght foot/ankle , no proprio Right toes , partial proprio at R ankle ,   Left foot intact sensation LT--motor and sensory exam essentially unchanged 11/17   Musculoskeletal:+ drop arm test on Right side , + Impingement with abd R side at 90 deg Is tight at end range with abd and ext rotation and arm flexion  Assessment/Plan: 1. Functional deficits which require 3+ hours per day of interdisciplinary therapy in a comprehensive inpatient rehab setting. Physiatrist is providing close team supervision and 24 hour management of active medical problems listed below. Physiatrist and rehab team continue to assess barriers to discharge/monitor patient progress toward functional and medical goals  Care Tool:  Bathing    Body parts bathed by patient: Right arm, Left arm, Chest, Abdomen, Front perineal area, Face   Body parts bathed by helper: Left lower leg, Right lower leg, Left upper leg, Buttocks, Right upper leg     Bathing assist Assist Level: Supervision/Verbal cueing     Upper Body Dressing/Undressing Upper body dressing   What is the patient wearing?: Pull over shirt    Upper body assist Assist Level: Set up assist    Lower Body Dressing/Undressing Lower body dressing      What is the patient wearing?: Pants, Underwear/pull up     Lower body assist Assist for lower body dressing: Contact Guard/Touching assist     Toileting Toileting    Toileting assist Assist for toileting: Supervision/Verbal cueing     Transfers Chair/bed transfer  Transfers assist     Chair/bed transfer assist level: Contact Guard/Touching assist     Locomotion Ambulation   Ambulation assist      Assist level:  Minimal Assistance - Patient > 75% Assistive device: Walker-rolling Max distance: 8 ft   Walk 10 feet activity   Assist  Walk 10 feet activity did not occur: Safety/medical concerns        Walk 50 feet activity   Assist Walk 50 feet with 2 turns activity did not occur: Safety/medical concerns         Walk 150 feet activity   Assist Walk 150 feet activity did not occur: Safety/medical concerns         Walk 10 feet on uneven surface  activity   Assist Walk 10 feet on uneven surfaces activity did not occur: Safety/medical concerns         Wheelchair     Assist Is the patient using a wheelchair?: Yes Type of Wheelchair: Manual    Wheelchair assist level: Dependent - Patient 0% Max wheelchair distance: 400 ft    Wheelchair 50 feet with 2 turns activity    Assist  Assist Level: Dependent - Patient 0%   Wheelchair 150 feet activity     Assist      Assist Level: Dependent - Patient 0%   Blood pressure (!) 135/97, pulse (!) 105, temperature 99.2 F (37.3 C), resp. rate 20, height 6' (1.829 m), weight (!) 152.2 kg, SpO2 100%.  Medical Problem List and Plan: 1. Functional deficits secondary to debility from MSSA bacteremia, c/b multiple medical comorbidities             -patient may shower with buttocks wound and lines covered             -ELOS/Goals: 11/26 , supervision PT/OT,   -Continue CIR therapies including PT, OT  2.  Antithrombotics: -DVT/anticoagulation:  Pharmaceutical: Eliquis 5 mg BID             -antiplatelet therapy: none   3. Pain Management: Tylenol as needed             -PO Dilaudid initiated on acute side due to nausea with oxycodone--> On admission patient requests to switch back, changed back to oxycodone 15 mg Q4H PRN and Dced Dilaudid, Increasing Oxy use to TID               -   lidocaine cream, discussed pregabalin which pt declines at this time   -have discussed etiology of numbness in legs.  -11/17 will  adjust gabapentin to 300mg  at bedtime rather than just after HD               4. Mood/Behavior/Sleep: LCSW to evaluate and provide emotional support             -continue melatonin 5 mg q HS             -antipsychotic agents: n/a          -11/11 Start alprazolam 0.25mg  PRN BID for anxiety Neuropsych following pt  5. Neuropsych/cognition: This patient is capable of making decisions on his own behalf.              -     6. Skin/Wound Care: Routine skin care checks             -pressure injury left buttock/posterior thigh>>continue wound care              - Offered LAL mattress given HX pressure wounds; patient declines- RIght buttocks wound has been healing well with standard mattress   7. Fluids/Electrolytes/Nutrition: Routine Is and Os and follow-up chemistries -Renal diet with 1200 cc fluid restriction             -Continue Fosrenol 2000 mg 3 times daily with meals   8: Hypotension: monitor TID and prn             -continue midodrine 30 mg 3 times daily with meals Encourage patient to discuss dosing with nephrology. No current orthostatic drops on current dose    Vitals:   06/15/23 0800 06/15/23 0804  BP: (!) 113/93 (!) 135/97  Pulse: (!) 45 (!) 105  Resp:  20  Temp:  99.2 F (37.3 C)  SpO2: 100% 100%      9: pAF: on Eliquis              -unable to tolerate amiodarone (requires midodrine for BP so not a candidate for nodal agents)On M-W-F digoxin    10: Hypothyroidism: Continue Synthroid 25 mcg daily   11: Chronic systolic heart failure: EF ~30-35%             -  continue digoxin 0.125 mg  3 x weekly             -daily weight             11/16 -volume mgt>>HD 3 x weekly--pt refused Friday due to timing.  Now on Tu Th Sa    12: Chronic hypoxic respiratory failure: on home O2 5 L via [place]   13: LE neuropathy/ R foot drop: CAM boot              - Pattern of weakness and sensory loss consistent with sciatic neuropathy above the knee (mild weakness  hamstrings); likely  compressive from ICU stay  - hopeful for improvement and likely will need EMG as OP -11/11 Continue TED hoses patient reports this is helping his neuropathic pain.  Gabapentin dose limited by renal function pt feels dosing after HD is more helpful .  Could consider trying Lyrica instead of gabapentin if pain worsens  Not a diabetic, possible uremic neuropathy given some missed HD treatments Right LE symptoms sensory and motor associated with deep decubitus over the right buttocks Pt may have had compressive neuropathy of Left sciatic nerve , just not as severe    14: Morbid Obesity: Body mass index is 45.51 kg/m.    15: Anemia: on ESP   16: History or HIT positivity>>no heparin   17: MSSA bacteremia: will continue Ancef 2 g with HD treatment 3x/week (through 12/09)   18. Vocal hoarseness s/p extubation. Consider ENT evaluation if no improvement.   11/16-discussed etiology and potential plan with pt today--likely outpt ENT referral if still symptomatic at that point 19.  ESRD on HD- nephro to manage     20. Insomnia  -  melatonin 5mg  somewhat helpful 21.  Right shoulder pain exam consistent with Right shoulder impingement syndrome vs tear-  xray shows mild R AC jt OA.  MRI shows intact rotator cuff, + adhesive capsulitis LOS: 12 days A FACE TO FACE EVALUATION WAS PERFORMED  Erick Colace 06/15/2023, 9:52 AM

## 2023-06-15 NOTE — Progress Notes (Signed)
Occupational Therapy Session Note  Patient Details  Name: Phillip Young MRN: [id number] Date of Birth: Jul 24, 1986  Today's Date: 06/15/2023 OT Individual Time: 6295-2841 OT Individual Time Calculation (min): 38 min  OT Individual Time: 1415-1505 OT Individual Time Calculation (min): 50 min    Short Term Goals: Week 2:  OT Short Term Goal 1 (Week 2): Pt will complete 3/3 toileting tasks wiht CGA using LRAD OT Short Term Goal 2 (Week 2): Pt will maintain standing balance during functional tasks >3 minutes OT Short Term Goal 3 (Week 2): Pt will complete LB dressing min A using AD and LRAD PRN  Skilled Therapeutic Interventions/Progress Updates:     AM Session:  Pt directly handed off from PT following session. Pt presenting to be in good spirits receptive to skilled OT session reporting 6/10 pain 2/2 neuropathy - OT offering intermittent rest breaks, repositioning, and therapeutic support to optimize participation in therapy session. Pt fatigued, however motivated to participate in session. Pt maintained on 2L O2 throughout session with PSO2 remaining >98%. Focus this session activity tolerance and dynamic standing balance. Pt transported to EOB using bed features with mod A to lift B LEs from EOB. Donned shoes total A for time management. Pt completed stand pivot EOB>wc using RW with verbal cues required for hand placement prior to stand. Engaged Pt in completing wc propulsion to therapy gym for endurance training with 3 rest break required during task. Engaged Pt in series of dynamic standing balance activities at table top to improve standing tolerance, balance, and decreasing [name] anxiety in standing position. Worked on releasing single UE from RW during activities to simulate skills required for BADLs. Pt able to maintain standing balance with CGA to min A with single UE support on RW and mod therapeutic support and motivation to decrease [name] anxiety. Pt able to tolerate standing 2-2.5 minutes  x3 trials with prolonged rest breaks provided between trials. Engaged Pt in light hearted conversation during trials to distract Pt from pain and to decrease anxiety symptoms. Pt with mild improvement in standing tolerance as Pt was challenged standing >30 seconds/trial during previous sessions. Transported Pt back to room in wc total A for energy conservation. Pt was left resting in wc with call bell in reach, seatbelt alarm on, and all needs met.    PM Session:  Pt received semi-reclined in bed presenting to be fatigued and mildly discouraged, however receptive to skilled OT session reporting 7/10 pain in B LEs 2/2 neuropathy- OT offering intermittent rest breaks, repositioning, and therapeutic support to optimize participation in therapy session. Engaged Pt in light hearted conservation to distract from pain with mod improvement. Co-treat session with TR, Misty Stanley, with focus on activity tolerance, anxiety management, and dynamic balance. Pt transitioned to EOB with min A to bring B LEs to EOB and hand held assist to lift trunk. Donned shoes sitting EOB total A for time management. Pt wearing 2L O2 throughout session. Pt completed stand pivot EOB>wc using RW with min A for balance and RW management. Transported Pt total A to therapy gym in wc for time management and energy conservation. Engaged pt in seated beach ball volley task seated in wc for endurance training and to increase overall endurance for BADLs. Pt tasked with utilizing a 3# weighted dowel to hit a beach back back and forth with therapist with pt able to tolerate 3 minutes of activity x1 trial with rest break provided following. Engaged Pt in completing dynamic standing balance activity with RW to  increase [name] standing tolerance, decrease anxiety in standing position, and to improve [name] overall independence in BADLs. Focused activity on decreasing [name] B UE support on RW and progressing towards simultaneously releasing B UEs from RW. Pt tasked with  passing large beach ball back and forth with TR in standing position using B UE. Pt initially extremely fearful in standing stating "I can't do this" and reporting feeling anxious, however with increased time and therapeutic support, Pt able to completed 3 trials with heavy mod provided for balance during first two trials progressing to min a during last trial. Pt able to intermittently release B UEs from RW for 2-6 seconds at a time with verbal and tactile cues provided for pacing of activity. Fatigue  reported at end of session. Transported Pt back to room total a in wc. Stand pivot wc>EOB min A for balance and RW management. EOB>supine min A to bring B LEs into bed. Pt was left resting in bed with call bell in reach, bed alarm on, and all needs met.    Therapy Documentation Precautions:  Precautions Precautions: Fall Precaution Comments: R sided weakness, R foot drop, O2 with activity, watch HR (has a-fib), Stage 3 wound on buttock Required Braces or Orthoses: Other Brace Other Brace: R foot-up brace, or CAM boot Restrictions Weight Bearing Restrictions: No  Therapy/Group: Individual Therapy  Clide Deutscher 06/15/2023, 12:28 PM

## 2023-06-15 NOTE — Progress Notes (Signed)
   06/15/23 0804  Vitals  Temp 99.2 F (37.3 C)  Pulse Rate (!) 105  Resp 20  BP (!) 135/97  SpO2 100 %  O2 Device Nasal Cannula  Weight (!) 152.2 kg  Type of Weight Post-Dialysis  Oxygen Therapy  O2 Flow Rate (L/min) 4 L/min  Patient Activity (if Appropriate) In bed  Post Treatment  Dialyzer Clearance Clear  Liters Processed 89.1  Fluid Removed (mL) 3700 mL  Tolerated HD Treatment Yes

## 2023-06-15 NOTE — Progress Notes (Signed)
Recreational Therapy Session Note  Patient Details  Name: Phillip Young MRN: [id number] Date of Birth: October 03, 1985 Today's Date: 06/15/2023  Pain: c/o foot pain when applying shoes, c/o buttocks pain with bed mobility-requesting skin assessment for skin breakdown-  RN made aware Goal:  Pt will maintain dynamic standing balance for moderate task without UE support with min assist.  MET Skilled Therapeutic Interventions/Progress Updates: Session focused on activity tolerance & dynamic standing balance during co-treat with OT.  Pt initially sat unsupported w/c level using 2 lb weighted dowel to tap a beach ball back to therapist with supervision.  Min cues for breathing technique with activity.  Transitioned to standing without UE support to toss/catch the beach ball with min assist.  Pt initially only able to stand for a couple tosses, but progressed to 7 tosses with improved balance at contact guard assist level.  Pt fatigues quickly needing frequent seated rest breaks.  Pt continues to express frustration with current status.  Emotional support provided.    Therapy/Group: Co-Treatment   Da Michelle 06/15/2023, 3:27 PM

## 2023-06-15 NOTE — Progress Notes (Signed)
For Team Conference:   Bowel/Bladder Current: Patient is oliguric.  Continent of bowel.  Goal: To continue to be contient Weekly Focus: Work on safe transfers to bathroom.   Pain:   Current: patient uses oxycodone for pain control.  Pain is in legs when present and is usually neuropathy.  Goal: Possibly pain management that is more multimodal for nerve pain.  His neurontin helps a little.  Increase dose?  Weekly Focus: continue 1-10 pain scale.  Add multimodals to help control.

## 2023-06-15 NOTE — Patient Care Conference (Signed)
Inpatient RehabilitationTeam Conference and Plan of Care Update Date: 06/15/2023   Time: 10:52 AM    Patient Name: Phillip Young      Medical Record Number: [id number]  Date of Birth: 31-Jul-1985 Sex: Male         Room/Bed: 4W26C/4W26C-01 Payor Info: Payor:  MEDICAID PREPAID HEALTH PLAN / Plan:  MEDICAID [employer] / Product Type: *No Product type* /    Admit Date/Time:  06/03/2023  3:31 PM  Primary Diagnosis:  Debility  Hospital Problems: Principal Problem:   Debility Active Problems:   Metabolic encephalopathy   Adjustment disorder with mixed anxiety and depressed mood    Expected Discharge Date: Expected Discharge Date: 06/20/23  Team Members Present: Physician leading conference: Dr. Claudette Laws Social Worker Present: Lavera Guise, BSW Nurse Present: Chana Bode, RN PT Present: Casimiro Needle, PT OT Present: Bonnell Public, OT SLP Present: Everardo Pacific, SLP PPS Coordinator present : Fae Pippin, SLP     Current Status/Progress Goal Weekly Team Focus  Bowel/Bladder      Patient is oliguric. Continent of bowel.     Maintain continence    Maintain continence  Swallow/Nutrition/ Hydration               ADL's   UB bathing shower level seated on TTB supervision, LB bathing CGA, UB dressing set-up, LB dressing CGA, toilet transfer stand pivot CGA with RW (ocasionally ambulates to toilet if he has enough energy CGA using RW); Barriers: self-limiting, anxiety, activity tolerance, R side strength   superivsion   activity tolerance, energy conservation, pt education, mindfulness techniques, anger management, functional transfers, strength training, BADL retraining    Mobility   bed mobility supervision using hospital bed features, CGA STS and transfers w/ RW, ambulating 10-20 ft bouts w/ RW CGA +2 for WC tow d/t quick need to sit down, 4" steps B HR x4 up and back down on bottom step.   supervision transfers and gait, CGA stair  navigation  Gait, endurance, strength, motivation, stairs, transfers, encouragement, AFO consult 11/22 ----- Barriers: self limiting, decreased R side strength, decreased activity tolerance    Communication                Safety/Cognition/ Behavioral Observations               Pain     Patient uses oxycodone for pain control. Pain is in legs when present and is usually neuropathy.      Pain < 4 with prns    Assess pain q shift and effectiveness of medications  Skin     Pressure injury on buttocks; stage 3    Wound healing and mom able to manage dressing changes    Dressing changes per order; weight shifting. Educate mother on dressing changes.    Discharge Planning:  D/C moved to 11/25 due to HD.   Team Discussion: Patient with debility limited by capsulitis to shoulder, right foot drop, neuropathy and hypersensitivity to left foot along with poor activity tolerance and anxiety.  Patient on target to meet rehab goals: yes, currently needs CGA for stand pivot transfers (poor biomechanics) abut able to ambulate up to 10 - 20' with CGA and manage (1) 3" step with bilateral rails.  Needs supervision for bating seated and, min assist for lower body bathing and set up for lower body dressing.   *See Care Plan and progress notes for long and short-term goals.   Revisions to Treatment Plan:  N/a   Teaching Needs: Safety,  dietary modifications, medications, transfers, toileting, skin care, etc.   Current Barriers to Discharge: Decreased caregiver support, Home enviroment access/layout, Wound care, Hemodialysis, and Behavior  Possible Resolutions to Barriers: Family education DME: RW HH follow up services  [facility]               Follow up on 06/30/2023.     Medical Summary Current Status: Increasing right shoulder pain and limitations noted.  Still with right foot drop.  Left foot without severe pain at this time  Barriers to  Discharge: Other (comments);Uncontrolled Pain;Complicated Wound  Barriers to Discharge Comments: Right shoulder range of motion and strength impairments Possible Resolutions to [employer] Focus: MRI of the right shoulder, continue pain medication adjustments, slowly increase endurance continue wound care   Continued Need for Acute Rehabilitation Level of Care: The patient requires daily medical management by a physician with specialized training in physical medicine and rehabilitation for the following reasons: Direction of a multidisciplinary physical rehabilitation program to maximize functional independence : Yes Medical management of patient stability for increased activity during participation in an intensive rehabilitation regime.: Yes Analysis of laboratory values and/or radiology reports with any subsequent need for medication adjustment and/or medical intervention. : Yes   I attest that I was present, lead the team conference, and concur with the assessment and plan of the team.   Chana Bode B 06/15/2023, 2:26 PM

## 2023-06-15 NOTE — Progress Notes (Signed)
Physical Therapy Session Note  Patient Details  Name: Phillip Young MRN: [id number] Date of Birth: 1986-06-19  Today's Date: 06/15/2023 PT Individual Time: 0920-0955 and 1600-1740 PT Individual Time Calculation (min): 35 min and 100 min  Short Term Goals: Week 2:  PT Short Term Goal 1 (Week 2): =LTG's  Skilled Therapeutic Interventions/Progress Updates:    Session 1: Pt received in bed, alarm activated, agreeable to PT, uncle present at bedside. Nursing present for medications. Pt received on 3L wall O2 via nasal cannula.  Time spent w/ pt discussing neuropathic pain, DC planning, equipment needs, emotional wellbeing, current level of function, and potential for progress. Pt mentioned power WC, PT educated on potentially not needing one at the moment given CLOF. PT to re-evaluate need for power WC. MD present for part of the session leading discussion about neuropathic pain and dialysis.  Bed mobility: supine > sit, minA R HHA to assist trunk positioning, use of bed features.  Sitting on EOB, donning/doffing UE dressing independently. TotalA donning LE dressing and shoes d/t high neuropathic pain.  STS and static standing balance w/ BUE support on RW, CGA while PT pulls up LE dressing rest of the way. Once in standing pt stand step w/ RW CGA to WC. Poor eccentric control coming to sit in WC.  WC propulsion supervision w/ BUE to turn 180 degrees around and back into spot next to bed. Pt reports his challenge with the WC is decreased endurance.  Pt left in WC on 3L wall O2, hand off to OT for treatment.  Session 2:Pt received in bed, alarm activated, agreeable to PT. Pt received on 2L wall O2 via nasal cannula and transitioned to portable tank.  Bed mobility sit<>supine: use of bed features, supervision. Pt requires significant verbal encouragement and increased time to perform w/ supervision. Pt compensates by using UE to assist w/ LE management.  Sitting on EOB, totalA donning shoes  d/t neuropathic pain.  Bed to chair transfer CGA, RW.  STS throughout session from [facility], CGA. Pt typically uses B armrests for powerup but was able to use 1 UE on RW and 1 on armrest of WC with no increase in difficulty. Pt continues to require verbal cues to lean forward to improve form of transfer.  PROM of L ankle, L toe, and midfoot d/t pain during ambulation. Pt did not complain of ankle and toe pain during gait trials after PROM occurred.  Gait training: L DF assist ace wrap. Use of RW, CGA, and +2 for WC tow. O2 increased to 3L via nasal cannula on portable tank for gait trials d/t pt request. - 30 ft, 22 ft, 20 ft, 30 ft w/ 90 degree R turn to sit in chair, 15 ft, 30 ft - Pt showed improvements in gait velocity by limiting the amount of standing rest breaks further improving his fluidity of gait. Pt required verbal cues to decrease his step length to allow for achievable less demanding movement. Increased verbal cues and visual cues of target distance for encouragement. Pt ambulates w/ increased UE support, intermittent forward flexed trunk w/ fatigue, and quick need to sit d/t fear of movement and exertion. Pt encouraged and educated about challenging himself to see change. -During last gait trial pt voluntarily reached over to portable oxygen tank increasing his own oxygen via nasal cannula to max L of O2. Despite PT educating him not to do that and that he has no symptoms of vitals dropping. O2 adjusted back to 3L.  Increased  rest breaks d/t fatigue and anxiety. Time spent during rest breaks for therapeutic listening and building pt rapport. As well as education about strength training, cardiopulmonary aerobic endurance, gait control theory, and mind over matter. Pt was engaged with education today.  Pt left in bed, alarm activated, all needs met.  Therapy Documentation Precautions:  Precautions Precautions: Fall Precaution Comments: R sided weakness, R foot drop, O2 with activity,  watch HR (has a-fib), Stage 3 wound on buttock Required Braces or Orthoses: Other Brace Other Brace: R foot-up brace, or CAM boot Restrictions Weight Bearing Restrictions: No Pain: Session 1: Pt presents with 9/10 leg pain during session. Nursing present at beginning of session giving pain medications. Session 2: Pt presents with 8/10 neuropathic leg pain during session. Rest breaks given, pain did not limit functional mobility. L ankle and big toe pain (unrated) during gait, MD notified given it has been ongoing.  Therapy/Group: Individual Therapy  FABRICIO MATSUDA 06/15/2023, 10:01 AM

## 2023-06-15 NOTE — Progress Notes (Signed)
Physical Therapy Session Note  Patient Details  Name: Najeeb Balliett MRN: [id number] Date of Birth: Jun 01, 1986  Today's Date: 06/15/2023 PT Missed Time: 60 Minutes Missed Time Reason: Other (Comment) (off unit for dialysis)  Patient off the unit for dialysis.Will follow-up to make-up missed time as scheduling allows.  Arnetha Silverthorne M Ediel Unangst 06/15/2023, 8:11 AM

## 2023-06-15 NOTE — Progress Notes (Signed)
   06/15/23 0804  Vitals  Temp 99.2 F (37.3 C)  Pulse Rate (!) 105  Resp 20  BP (!) 135/97  SpO2 100 %  O2 Device Nasal Cannula  Weight (!) 152.2 kg  Type of Weight Post-Dialysis  Oxygen Therapy  O2 Flow Rate (L/min) 4 L/min  Patient Activity (if Appropriate) In bed   Received patient in bed to unit.  Alert and oriented.  Informed consent signed and in chart.   TX duration:4  Patient tolerated well.  Transported back to the room  Alert, without acute distress.  Hand-off given to patient's nurse.   Access used: Yes Access issues: No  Total UF removed: 2800 Medication(s) given: See MAR Post HD VS: See Above Grid Post HD weight: 152.2 kg   [facility]

## 2023-06-15 NOTE — Progress Notes (Signed)
  [facility] Progress Note   Subjective: had HD late last night / early this am. No c/o's.   Objective Vitals:   06/15/23 0700 06/15/23 0730 06/15/23 0800 06/15/23 0804  BP: (!) 159/68 102/77 (!) 113/93 (!) 135/97  Pulse: (!) 56 (!) 48 (!) 45 (!) 105  Resp:    20  Temp:    99.2 F (37.3 C)  TempSrc:      SpO2: 100% 99% 100% 100%  Weight:    (!) 152.2 kg  Height:       Physical Exam: General: Alert obese male NAD Heart: RRR no MRG Lungs: CTAB  Abdomen: Obese NABS soft NT ND Extremities: BLE edema Dialysis Access: TDC intact    OP  dialysis Orders: MWF at [facility] 4.5h   400/800    172kg   2K/2Ca bath   TDC   No heparin (+ HIT)   Assessment/Plan: 1. Acute hypoxic respiratory failure: Remains on supplemental oxygen and ongoing efforts at continued ultrafiltration/lowering EDW with dialysis (anticipating that he has suffered weight loss during this hospitalization). He is stable at baseline O2 requirement.  2. MSSA Bacteremia: TDC replaced after he underwent line holiday 05/26/23-05/30/23.  Continue cefazolin IV per pmd.  2. ESRD: HD MWF schedule but changed to TTS for now. HD tomorrow.  3. HTN/volume: He remains on midodrine 30mg  TID, still having some soft blood pressures, continue current dose and will reeval to see if we can decrease dose  4. Anemia: Hgb 10.1 at goal, currently on  ESA 150 weekly --> we decreased to 60 with Hgb over 10 5. Secondary hyperparathyroidism: Calcium level elevated and VDRA remains on hold, phosphorus 4.7 lanthanum  Phos binder 6. Nutrition:  Continue renal diet and protein supplements 7. H/o HIT: no heparin w/ dialysis, block catheter w/ sodium citrate 8.  Deconditioning right foot drop- rehab  with CIR  Disposition: Planning to DC 06/20/2023. Will be on MWF schedule at [facility]  MD  CKA 06/15/2023, 2:57 PM  Recent Labs  Lab 06/10/23 1414 06/11/23 1348 06/14/23 1153  HGB 11.1* 10.1* 10.8*  ALBUMIN 3.1*  --  3.0*   CALCIUM 10.4* 10.0 9.7  PHOS 4.7*  --  6.5*  CREATININE 8.62* 10.18* 10.99*  K 4.2 4.0 5.3*

## 2023-06-15 NOTE — Progress Notes (Signed)
Patient ID: Phillip Young, male   DOB: 1985-09-23, 37 y.o.   MRN: [id number]  Team Conference Report to Patient/Family  Team Conference discussion was reviewed with the patient and caregiver, including goals, any changes in plan of care and target discharge date.  Patient and caregiver express understanding and are in agreement.  The patient has a target discharge date of 06/20/23.  SW made attempt to meet with patient. Patient being seen by physician.   SW reached out to pt's mother, [name] to provide conference updates. Family education arranged with mother on 11/23 1-4 PM. No additional questions or concerns currently.    Andria Rhein 06/15/2023, 2:37 PM

## 2023-06-16 ENCOUNTER — Inpatient Hospital Stay (HOSPITAL_COMMUNITY): Payer: Medicaid Other

## 2023-06-16 DIAGNOSIS — G5701 Lesion of sciatic nerve, right lower limb: Secondary | ICD-10-CM | POA: Diagnosis not present

## 2023-06-16 DIAGNOSIS — N186 End stage renal disease: Secondary | ICD-10-CM | POA: Diagnosis not present

## 2023-06-16 DIAGNOSIS — R5381 Other malaise: Secondary | ICD-10-CM | POA: Diagnosis not present

## 2023-06-16 DIAGNOSIS — I5022 Chronic systolic (congestive) heart failure: Secondary | ICD-10-CM | POA: Diagnosis not present

## 2023-06-16 MED ORDER — PREGABALIN 50 MG PO CAPS
50.0000 mg | ORAL_CAPSULE | Freq: Every day | ORAL | Status: DC
  Administered 2023-06-17 – 2023-06-20 (×4): 50 mg via ORAL
  Filled 2023-06-16 (×5): qty 1

## 2023-06-16 MED ORDER — GABAPENTIN 300 MG PO CAPS: 300.0000 mg | ORAL_CAPSULE | ORAL | Status: DC

## 2023-06-16 MED ORDER — PREGABALIN 50 MG PO CAPS
50.0000 mg | ORAL_CAPSULE | ORAL | Status: DC
  Administered 2023-06-18: 50 mg via ORAL
  Filled 2023-06-16 (×3): qty 1

## 2023-06-16 NOTE — Progress Notes (Addendum)
PROGRESS NOTE   Subjective/Complaints:  Left foot swelling and pain , patient specifically complains of left great toe pain as well as left ankle pain.  He has these problems with weightbearing.  He does have a history of fungal infection of the left great toe with thickened toenail and poor fit of shoe wear patient is not sure whether this may be causing it.  PT has noted some reduced mobility of the left first MTP He does have bilateral foot pain more numbness on the right side and more burning pain on the left side.  He is on gabapentin 300 mg/day to be given after dialysis  CXR ordered per Dr Arlean Hopping cardiomegaly but no fluid overload The patient also gives a history of using BiPAP at night but feels like he did not really needed after losing weight.  He has not followed up with his sleep doctor for greater than 1 year He will be followed by internal medicine clinic.   ROS: Patient denies SOB, DOE, feels needs to increase O2 at noc , No GI distress      Objective:   DG CHEST PORT 1 VIEW  Result Date: 06/15/2023 CLINICAL DATA:  End-stage renal disease on hemodialysis. EXAM: PORTABLE CHEST 1 VIEW COMPARISON:  05/24/2023 FINDINGS: Stable positioning of right hemodialysis catheter. Cardiomegaly is unchanged. No focal airspace disease, pulmonary edema, pneumothorax or large pleural effusion. Stable osseous structures. IMPRESSION: Unchanged cardiomegaly. No acute findings. Electronically Signed   By: Narda Rutherford M.D.   On: 06/15/2023 16:15   MR SHOULDER RIGHT WO CONTRAST  Result Date: 06/14/2023 CLINICAL DATA:  Chronic right shoulder pain. EXAM: MRI OF THE RIGHT SHOULDER WITHOUT CONTRAST TECHNIQUE: Multiplanar, multisequence MR imaging of the shoulder was performed. No intravenous contrast was administered. COMPARISON:  Right shoulder x-rays from yesterday. FINDINGS: Rotator cuff: Intact rotator cuff. Mild distal supraspinatus  tendinosis. Muscles: No atrophy or abnormal signal of the muscles of the rotator cuff. Biceps long head:  Intact and normally positioned. Acromioclavicular Joint: Normal acromioclavicular joint. Os acromiale without significant degenerative changes. Glenohumeral Joint: No joint effusion. No chondral defect. Labrum: Grossly intact, but evaluation is limited by lack of intraarticular fluid. Bones:  No marrow abnormality, fracture or dislocation. Other: Partial loss normal fat within the rotator interval. Mild soft tissue swelling in the axillary recess. IMPRESSION: 1. Mild distal supraspinatus tendinosis. No rotator cuff tear. 2. Os acromiale without significant degenerative changes. 3. Findings which can be seen with adhesive capsulitis. Electronically Signed   By: Obie Dredge M.D.   On: 06/14/2023 17:22   Recent Labs    06/14/23 1153  WBC 5.4  HGB 10.8*  HCT 35.0*  PLT 228    Recent Labs    06/14/23 1153  NA 136  K 5.3*  CL 100  CO2 22  GLUCOSE 90  BUN 41*  CREATININE 10.99*  CALCIUM 9.7     Intake/Output Summary (Last 24 hours) at 06/16/2023 4782 Last data filed at 06/15/2023 1901 Gross per 24 hour  Intake 120 ml  Output --  Net 120 ml     Pressure Injury 05/18/23 Buttocks Right Stage 3 -  Full thickness tissue loss. Subcutaneous fat may  be visible but bone, tendon or muscle are NOT exposed. (Active)  05/18/23 2300  Location: Buttocks  Location Orientation: Right  Staging: Stage 3 -  Full thickness tissue loss. Subcutaneous fat may be visible but bone, tendon or muscle are NOT exposed.  Wound Description (Comments):   Present on Admission: Yes (original date 05/18/23; present on admit to CIR 06/03/23)     Pressure Injury 05/18/23 Buttocks Left Stage 2 -  Partial thickness loss of dermis presenting as a shallow open injury with a red, pink wound bed without slough. (Active)  05/18/23 2300  Location: Buttocks  Location Orientation: Left  Staging: Stage 2 -  Partial  thickness loss of dermis presenting as a shallow open injury with a red, pink wound bed without slough.  Wound Description (Comments):   Present on Admission: Yes (original date 05/18/23; present on admit to CIR 06/03/23)    Physical Exam: Vital Signs Blood pressure 112/66, pulse 83, temperature 98.1 F (36.7 C), resp. rate 18, height 6' (1.829 m), weight (!) 152.2 kg, SpO2 100%.   General: No acute distress Mood and affect are appropriate Heart: Regular rate and rhythm no rubs murmurs or extra sounds Lungs: Clear to auscultation, breathing unlabored, no rales or wheezes Abdomen: Positive bowel sounds, soft nontender to palpation, nondistended Extremities: No clubbing, cyanosis, or edema   L/R buttock wounds dressed. Neurologic: Cranial nerves II through XII intact, motor strength is 5/5 in bilateral deltoid, bicep, tricep, grip, 0/5 right  ankle dorsiflexor and plantar flexor 5/5 RIght quad 4/5 R hamstring, 5/5 left quad and hamstring and ankle  Sensory exam absent LT sensation RIght foot/ankle , no proprio Right toes , partial proprio at R ankle ,   Left foot intact sensation light touch is diminished over the left great toe, proprioception is diminished in the left great toe as well he does have intact proprioception at the ankle.  Musculoskeletal: No evidence of MTP swelling on the left side no swelling at the ankle joint.  There is evidence of peripheral edema 1+ both feet Is tight at end range with abd and ext rotation and arm flexion  Assessment/Plan: 1. Functional deficits which require 3+ hours per day of interdisciplinary therapy in a comprehensive inpatient rehab setting. Physiatrist is providing close team supervision and 24 hour management of active medical problems listed below. Physiatrist and rehab team continue to assess barriers to discharge/monitor patient progress toward functional and medical goals  Care Tool:  Bathing    Body parts bathed by patient: Right  arm, Left arm, Chest, Abdomen, Front perineal area, Face   Body parts bathed by helper: Left lower leg, Right lower leg, Left upper leg, Buttocks, Right upper leg     Bathing assist Assist Level: Supervision/Verbal cueing     Upper Body Dressing/Undressing Upper body dressing   What is the patient wearing?: Pull over shirt    Upper body assist Assist Level: Set up assist    Lower Body Dressing/Undressing Lower body dressing      What is the patient wearing?: Pants, Underwear/pull up     Lower body assist Assist for lower body dressing: Contact Guard/Touching assist     Toileting Toileting    Toileting assist Assist for toileting: Supervision/Verbal cueing     Transfers Chair/bed transfer  Transfers assist     Chair/bed transfer assist level: Contact Guard/Touching assist     Locomotion Ambulation   Ambulation assist      Assist level: Minimal Assistance - Patient > 75%  Assistive device: Walker-rolling Max distance: 8 ft   Walk 10 feet activity   Assist  Walk 10 feet activity did not occur: Safety/medical concerns        Walk 50 feet activity   Assist Walk 50 feet with 2 turns activity did not occur: Safety/medical concerns         Walk 150 feet activity   Assist Walk 150 feet activity did not occur: Safety/medical concerns         Walk 10 feet on uneven surface  activity   Assist Walk 10 feet on uneven surfaces activity did not occur: Safety/medical concerns         Wheelchair     Assist Is the patient using a wheelchair?: Yes Type of Wheelchair: Manual    Wheelchair assist level: Dependent - Patient 0% Max wheelchair distance: 400 ft    Wheelchair 50 feet with 2 turns activity    Assist        Assist Level: Dependent - Patient 0%   Wheelchair 150 feet activity     Assist      Assist Level: Dependent - Patient 0%   Blood pressure 112/66, pulse 83, temperature 98.1 F (36.7 C), resp. rate 18,  height 6' (1.829 m), weight (!) 152.2 kg, SpO2 100%.  Medical Problem List and Plan: 1. Functional deficits secondary to debility from MSSA bacteremia, c/b multiple medical comorbidities             -patient may shower with buttocks wound and lines covered             -ELOS/Goals: 11/26 , supervision PT/OT,   -Continue CIR therapies including PT, OT    Spoke with patient as well as physical therapy today.  Patient is willing to stay longer if additional goals can be achieved.  PT states that the patient made some great strides yesterday.  Will continue to monitor therapy progress to determine whether additional days are needed to upgrade goals.  OT input  would be helpful as well  2.  Antithrombotics: -DVT/anticoagulation:  Pharmaceutical: Eliquis 5 mg BID             -antiplatelet therapy: none   3. Pain Management: Tylenol as needed             -PO Dilaudid initiated on acute side due to nausea with oxycodone--> On admission patient requests to switch back, changed back to oxycodone 15 mg Q4H PRN and Dced Dilaudid, Increasing Oxy use to TID hope to wean this if patient can get good relief with pregabalin              -   lidocaine cream, discussed pregabalin which pt declines at this time   -have discussed etiology of numbness in legs.  -DC gabapentin and start pregabalin 50 mg will give an extra 50 mg after dialysis as well              4. Mood/Behavior/Sleep: LCSW to evaluate and provide emotional support             -continue melatonin 5 mg q HS             -antipsychotic agents: n/a          -11/11 Start alprazolam 0.25mg  PRN BID for anxiety Neuropsych following pt  5. Neuropsych/cognition: This patient is capable of making decisions on his own behalf.              -  6. Skin/Wound Care: Routine skin care checks             -pressure injury left buttock/posterior thigh>>continue wound care              - Offered LAL mattress given HX pressure wounds; patient declines- RIght  buttocks wound has been healing well with standard mattress   7. Fluids/Electrolytes/Nutrition: Routine Is and Os and follow-up chemistries -Renal diet with 1200 cc fluid restriction             -Continue Fosrenol 2000 mg 3 times daily with meals   8: Hypotension: monitor TID and prn             -continue midodrine 30 mg 3 times daily with meals Encourage patient to discuss dosing with nephrology. No current orthostatic drops on current dose    Vitals:   06/15/23 2013 06/16/23 0643  BP: 113/78 112/66  Pulse: 61 83  Resp: 17 18  Temp: 98.2 F (36.8 C) 98.1 F (36.7 C)  SpO2: 100% 100%      9: pAF: on Eliquis              -unable to tolerate amiodarone (requires midodrine for BP so not a candidate for nodal agents)On M-W-F digoxin    10: Hypothyroidism: Continue Synthroid 25 mcg daily   11: Chronic systolic heart failure: EF ~30-35%             -continue digoxin 0.125 mg  3 x weekly             -daily weight             11/16 -volume mgt>>HD 3 x weekly--pt refused Friday due to timing.  Now on Tu Th Sa    12: Chronic hypoxic respiratory failure: on home O2 5 L via [place]   13: LE neuropathy/ R foot drop: CAM boot              - Pattern of weakness and sensory loss consistent with sciatic neuropathy above the knee (mild weakness  hamstrings); likely compressive from ICU stay  - hopeful for improvement and likely will need EMG as OP -11/11 Continue TED hoses patient reports this is helping his neuropathic pain.  Gabapentin dose limited by renal function pt feels dosing after HD is more helpful .  Could consider trying Lyrica instead of gabapentin if pain worsens  Not a diabetic, possible uremic neuropathy given some missed HD treatments Right LE symptoms sensory and motor associated with deep decubitus over the right buttocks Pt may have had compressive neuropathy of Left sciatic nerve , just not as severe    14: Morbid Obesity: Body mass index is 45.51 kg/m.    15: Anemia:  on ESP   16: History or HIT positivity>>no heparin   17: MSSA bacteremia: will continue Ancef 2 g with HD treatment 3x/week (through 12/09)   18. Vocal hoarseness s/p extubation. Consider ENT evaluation if no improvement.   11/16-discussed etiology and potential plan with pt today--likely outpt ENT referral if still symptomatic at that point 19.  ESRD on HD- nephro to manage     20. Insomnia  -  melatonin 5mg  somewhat helpful 21.  Right shoulder pain exam consistent with Right shoulder impingement syndrome vs tear-  xray shows mild R AC jt OA.  MRI shows intact rotator cuff, + adhesive capsulitis  22.  Left foot and ankle pain.  Mainly left great toe MTP does not look inflamed pain is  mainly with weightbearing.  Suspect that this is mainly due to neuropathic pain but will check x-rays as well.  He has a history of morbid obesity and could have  Degenerative changes despite his age 23 days A FACE TO FACE EVALUATION WAS PERFORMED  Erick Colace 06/16/2023, 8:28 AM

## 2023-06-16 NOTE — Group Note (Signed)
Patient Details Name: Phillip Young MRN: [id number] DOB: 08-Oct-1985 Today's Date: 06/16/2023  Time Calculation: OT Group Time Calculation OT Group Start Time: 1430 OT Group Stop Time: 1530 OT Group Time Calculation (min): 60 min      Group Description: Dance Group: Pt participated in dance group with an emphasis on social interaction, motor planning, increasing overall activity tolerance and bimanual tasks. All songs were selected by group members. Dance moves included AROM of BUE/BLE gross motor movements with an emphasis on building functional endurance.    Individual level documentation: Patient completed group from sitting level. Patientt needed supervision to complete various dance moves with cues for attention and motivation.  Patient able to create his own modifications during group.  Pain:  0/10  Precautions:  Falls  Clide Deutscher 06/16/2023, 3:44 PM

## 2023-06-16 NOTE — Progress Notes (Signed)
Patient refused CHG bath, educated.

## 2023-06-16 NOTE — Progress Notes (Addendum)
Patient ID: Phillip Young, male   DOB: 06/28/1986, 37 y.o.   MRN: [id number]  [facility] referral sent to [facility]  1:24 PM: Patient approved orders sent.

## 2023-06-16 NOTE — Progress Notes (Signed)
  [facility] Progress Note   Subjective: no c/o today  Objective Vitals:   06/15/23 0804 06/15/23 1856 06/15/23 2013 06/16/23 0643  BP: (!) 135/97 (!) 98/59 113/78 112/66  Pulse: (!) 105 73 61 83  Resp: 20 19 17 18   Temp: 99.2 F (37.3 C) 98.4 F (36.9 C) 98.2 F (36.8 C) 98.1 F (36.7 C)  TempSrc:  Oral    SpO2: 100% 100% 100% 100%  Weight: (!) 152.2 kg     Height:       Physical Exam: General: Alert obese male NAD Heart: RRR no MRG Lungs: CTAB  Abdomen: Obese NABS soft NT ND Extremities: BLE edema Dialysis Access: TDC intact    OP  dialysis Orders: MWF at [facility] 4.5h   400/800    172kg   2K/2Ca bath   TDC   No heparin (+ HIT)   Assessment/Plan: 1. Acute hypoxic respiratory failure: Remains on supplemental oxygen and ongoing efforts at continued ultrafiltration/lowering EDW with dialysis (anticipating that he has suffered weight loss during this hospitalization). He is stable at baseline O2 requirement.  2. MSSA Bacteremia: TDC replaced after he underwent line holiday 05/26/23-05/30/23.  Continue cefazolin IV per pmd.  2. ESRD: HD MWF schedule but changed to TTS for this week. HD today, then Sat. Next week will change back to MWF which, with the holiday schedule, will be [place] [facility] -Tues- Friday next week.  3. HTN/volume: He remains on midodrine 30mg  TID, still having some soft blood pressures, continue current dose and will reeval to see if we can decrease dose  4. Anemia: Hgb 10.1 at goal, currently on  ESA 150 weekly --> we decreased to 60 with Hgb over 10 5. Secondary hyperparathyroidism: Calcium level elevated and VDRA remains on hold, phosphorus 4.7 lanthanum  Phos binder 6. Nutrition:  Continue renal diet and protein supplements 7. H/o HIT: no heparin w/ dialysis, block catheter w/ sodium citrate 8.  Deconditioning right foot drop- rehab  with CIR  Disposition: Planning to DC 06/20/2023. Will be on MWF schedule at [facility]  Vinson Moselle  MD   [facility] 06/16/2023, 2:37 PM  Recent Labs  Lab 06/10/23 1414 06/11/23 1348 06/14/23 1153  HGB 11.1* 10.1* 10.8*  ALBUMIN 3.1*  --  3.0*  CALCIUM 10.4* 10.0 9.7  PHOS 4.7*  --  6.5*  CREATININE 8.62* 10.18* 10.99*  K 4.2 4.0 5.3*

## 2023-06-16 NOTE — Progress Notes (Signed)
Physical Therapy Session Note  Patient Details  Name: Phillip Young MRN: [id number] Date of Birth: [birth year]  Today's Date: 06/16/2023 PT Individual Time: 0803-0918 PT Individual Time Calculation (min): 75 min   Short Term Goals: Week 2:  PT Short Term Goal 1 (Week 2): =LTG's  Skilled Therapeutic Interventions/Progress Updates:    Pt received asleep, supine in bed requiring increased time to awaken and is agreeable to therapy session. Pt had moved nasal cannula below his nose, but received and maintained on 3L of O2 via nasal cannula throughout session.  Supine>sitting L EOB, HOB slightly elevated and using bedrail, with slight R HHA for stability while bringing trunk upright per pt request, despite cuing not to use this support to promote increased independence.  Sitting EOB threaded on LB clothing set-up assist. Donned shoes total A for pain management due to neuropathy.  Sit>stand elevated EOB>bari RW with CGA/light min A for steadying - continues to rely on B UE support AD to come to standing. Standing using intermittent UE support on AD pulled pants up over hips with pt starting with a quick movement resulting in posterior LOB and having to sit back down on bed quickly. Reinforced education with pt on need to keep his knees slightly flexed and perform slow movements to avoid large perturbations - upon 2nd attempt pt able to pull pants up over hips with only CGA.  Pt requesting to eat breakfast, completed with set-up assist, while sitting EOB. During this time therapist discussed pt's CLOF and D/C planning including the following:  - pt confirms he has a full electric hospital bed and wheelchair at home - pt reports he feels safe ambulating at household level using RW  - pt reports he feels safe with clothing management, but has concerns regarding bathing/showering - pt reports he also doesn't feel comfortable attempting ambulation from car to home entrance, discussed pt's option of  using his wheelchair to navigate this distance and pt in agreement with this recommendation - therapist educated pt on his need for support to perform iADLs such as laundry, cooking, etc. - educated pt on recommendation for follow-up HHPT to further progress his mobility and pt in agreement  - reinforced education on plan for AFO consult tomorrow and need for his shoes prior  RN in/out for medication administration. MD in/out for morning assessment - discussed pt's L ankle and great toe pain providing information from therapist assessment yesterday.  Gait training ~66ft x3 from EOB to w/c using bari-RW with CGA for safety/steadying and no w/c follow (but did have +2 available if needed due to pt having unpredictable/sudden onset of anxiety and unable to be calmed and redirected to stay standing when this occurs during gait) - donned R LE DF assist ACE wrap due to continued foot drop.  Pt demonstrating the following gait deviations with therapist providing the described cuing and facilitation for improvement:  - slow gait speed - a few more pauses in double-limb support throughout gait noticed today due to min increased anxiety with gait training not having w/c follow - poor R LE foot clearance without DF ACE wrap  - heavy reliance on B UE support on AD  - quick/uncontrolled movements of RW when turning to sit *Pt benefits significantly from emotional support and encouragement throughout session to focus on breathing in through his nose and out through his mouth during gait training. Pt benefits from +2 providing this support in front of him where he can see their calming face.  Pt  continues to have greatest barrier of anxiety with mobility and exertion - anticipate this will improve in his home environment as pt reports feeling really confident in his ability to ambulate in that setting at his CLOF.  Pt left seated in w/c with needs in reach, seat belt alarm on, and O2 line intact.    Therapy  Documentation Precautions:  Precautions Precautions: Fall Precaution Comments: R sided weakness, R foot drop, O2 with activity, watch HR (has a-fib), Stage 3 wound on buttock Required Braces or Orthoses: Other Brace Other Brace: R foot-up brace, or CAM boot Restrictions Weight Bearing Restrictions: No   Pain:  Continues to have B LE neuropathic pain - premedicated at beginning of session - provided rest breaks, distraction, and emotional support for pain management. No complaints of L ankle pain with these short distance gait bouts today.    Therapy/Group: Individual Therapy  Ginny Forth , PT, DPT, NCS, CSRS 06/16/2023, 7:52 AM

## 2023-06-16 NOTE — Progress Notes (Signed)
Occupational Therapy Session Note  Patient Details  Name: Phillip Young MRN: [id number] Date of Birth: 1986-02-12  Today's Date: 06/16/2023 OT Individual Time: 1005-1059 OT Individual Time Calculation (min): 54 min   Short Term Goals: Week 2:  OT Short Term Goal 1 (Week 2): Pt will complete 3/3 toileting tasks wiht CGA using LRAD OT Short Term Goal 2 (Week 2): Pt will maintain standing balance during functional tasks >3 minutes OT Short Term Goal 3 (Week 2): Pt will complete LB dressing min A using AD and LRAD PRN  Skilled Therapeutic Interventions/Progress Updates:     Pt received sitting up in wc with nurse tech present in room. Pt presenting to be in good spirits receptive to skilled OT session reporting 7/10 pain in feet and legs- OT offering intermittent rest breaks, repositioning, and therapeutic support to optimize participation in therapy session. Focus this session standing balance, activity tolerance, and sit<>stands to increase independence in BADLs. Transported Pt total A to therapy gym in wc for time management and energy conservation. Pt with 2L O2 donned throughout session. Engaged Pt in completing short distance functional mobility ~5 ft wc>EOB using RW with CGA provided for safety +increased time and mod tactile/verbal cues for R foot step through pattern. Engaged Pt in series of dynamic standing activities using RW at elevated table to increase standing tolerance, dynamic standing balance, and confidence of standing position. Worked on releasing single UE from RW at a time to simulate skills related to BADLs with Pt able to maintain balance with CGA. Decreased anxiety noted during standing this session compared to previous with Pt able tolerate standing 1.5-2 minutes for >6 trials. Pt with seated rest breaks between trials for energy conservation. Worked on sit<>stands from [facility] with emphasis on hand placement and technique with Pt able to complete 6 sit<>stands with CGA pushing up  with single hand positioned on RW and opposite on mat table. Pt then completed seated beach ball volley EOM to work on activity tolerance, UB strength, and postural alignment/strength. Pt able to tolerate completing 3 trials of two minutes with short seated rest breaks provided between trial. Pt completed functional mobility EOM>wc ~5 ft using RW with CGA +increased time. Pt was left resting in therapy gym for upcoming group therapy session with all immediate needs met.    Therapy Documentation Precautions:  Precautions Precautions: Fall Precaution Comments: R sided weakness, R foot drop, O2 with activity, watch HR (has a-fib), Stage 3 wound on buttock Required Braces or Orthoses: Other Brace Other Brace: R foot-up brace, or CAM boot Restrictions Weight Bearing Restrictions: No   Therapy/Group: Individual Therapy  Clide Deutscher 06/16/2023, 10:43 AM

## 2023-06-16 NOTE — Plan of Care (Signed)
  Problem: RH Stairs Goal: LTG Patient will ambulate up and down stairs w/assist (PT) Description: LTG: Patient will ambulate up and down # of stairs with assistance (PT) Outcome: Not Applicable Flowsheets (Taken 06/16/2023 1820) LTG: Pt will ambulate up/down stairs assist needed:: (discontinued goal as pt reports level home entry and anxiety has limited participation in stair training) -- Note: discontinued goal as pt reports level home entry and anxiety has limited participation in stair training    Problem: RH Balance Goal: LTG Patient will maintain dynamic standing balance (PT) Description: LTG:  Patient will maintain dynamic standing balance with assistance during mobility activities (PT) Flowsheets (Taken 06/16/2023 1820) LTG: Pt will maintain dynamic standing balance during mobility activities with:: (downgraded based on pt's CLOF and ELOS) Contact Guard/Touching assist Note: downgraded based on pt's CLOF and ELOS    Problem: Sit to Stand Goal: LTG:  Patient will perform sit to stand with assistance level (PT) Description: LTG:  Patient will perform sit to stand with assistance level (PT) Flowsheets (Taken 06/16/2023 1820) LTG: PT will perform sit to stand in preparation for functional mobility with assistance level: (downgraded based on pt's CLOF and ELOS) Contact Guard/Touching assist Note: downgraded based on pt's CLOF and ELOS    Problem: RH Bed Mobility Goal: LTG Patient will perform bed mobility with assist (PT) Description: LTG: Patient will perform bed mobility with assistance, with/without cues (PT). Flowsheets (Taken 06/16/2023 1820) LTG: Pt will perform bed mobility with assistance level of: ((using hospital bed features) downgraded based on pt's CLOF and ELOS) Supervision/Verbal cueing Note: downgraded based on pt's CLOF and ELOS    Problem: RH Bed to Chair Transfers Goal: LTG Patient will perform bed/chair transfers w/assist (PT) Description: LTG: Patient will  perform bed to chair transfers with assistance (PT). Flowsheets (Taken 06/16/2023 1820) LTG: Pt will perform Bed to Chair Transfers with assistance level: (downgraded based on pt's CLOF and ELOS) Contact Guard/Touching assist Note: downgraded based on pt's CLOF and ELOS    Problem: RH Car Transfers Goal: LTG Patient will perform car transfers with assist (PT) Description: LTG: Patient will perform car transfers with assistance (PT). Flowsheets (Taken 06/16/2023 1820) LTG: Pt will perform car transfers with assist:: (downgraded based on pt's CLOF and ELOS) Minimal Assistance - Patient > 75% Note: downgraded based on pt's CLOF and ELOS    Problem: RH Furniture Transfers Goal: LTG Patient will perform furniture transfers w/assist (OT/PT) Description: LTG: Patient will perform furniture transfers  with assistance (OT/PT). Flowsheets (Taken 06/16/2023 1820) LTG: Pt will perform furniture transfers with assist:: (downgraded based on pt's CLOF and ELOS) Contact Guard/Touching assist Note: downgraded based on pt's CLOF and ELOS    Problem: RH Ambulation Goal: LTG Patient will ambulate in controlled environment (PT) Description: LTG: Patient will ambulate in a controlled environment, # of feet with assistance (PT). Flowsheets (Taken 06/16/2023 1820) LTG: Pt will ambulate in controlled environ  assist needed:: (downgraded based on pt's CLOF and ELOS) Contact Guard/Touching assist LTG: Ambulation distance in controlled environment: 4ft using LRAD Note: downgraded based on pt's CLOF and ELOS  Goal: LTG Patient will ambulate in home environment (PT) Description: LTG: Patient will ambulate in home environment, # of feet with assistance (PT). Flowsheets (Taken 06/16/2023 1820) LTG: Pt will ambulate in home environ  assist needed:: (downgraded based on pt's CLOF and ELOS) Contact Guard/Touching assist LTG: Ambulation distance in home environment: 71ft using LRAD Note: downgraded based on pt's CLOF  and ELOS

## 2023-06-16 NOTE — Progress Notes (Signed)
Offered patient miralax, refused,  reminded last bm was on 18th , still refused, patient stating that it will happen when it happens. Laurel Dimmer, LPN 3:29 AM 51/88

## 2023-06-16 NOTE — Group Note (Signed)
Patient Details Name: Phillip Young MRN: [id number] DOB: 1986/06/12 Today's Date: 06/16/2023  Pt will actively participate in 60 minute therapeutic dance group emphasizing activity tolerance.  MET  Group Description: Dance Group: Pt participated in dance group with an emphasis on social interaction, motor planning, increasing overall activity tolerance and bimanual tasks. All songs were selected by group members. Dance moves included AROM of BUE/BLE gross motor movements with an emphasis on building functional endurance.    Individual level documentation: Patient completed group from sitting level. Patientt needed supervision to complete various dance moves with cues for safety/alertness.  Pt often with eyes closed but participatory.  Pt states grogginess is a side effect of medications.  Patient needed minimal modifications during group.  Pain:no c/o    Precautions:R sided weakness, R foot drop, O2 with activity, watch HR (has a-fib), Stage 3 wound on buttock     Ashlyne Olenick 06/16/2023, 2:00 PM

## 2023-06-17 DIAGNOSIS — F54 Psychological and behavioral factors associated with disorders or diseases classified elsewhere: Secondary | ICD-10-CM

## 2023-06-17 MED ORDER — CEFAZOLIN SODIUM-DEXTROSE 1-4 GM/50ML-% IV SOLN
1.0000 g | Freq: Every day | INTRAVENOUS | Status: DC
  Administered 2023-06-17 – 2023-06-20 (×3): 1 g via INTRAVENOUS
  Filled 2023-06-17 (×3): qty 50

## 2023-06-17 NOTE — Progress Notes (Signed)
Occupational Therapy Session Note  Patient Details  Name: Phillip Young MRN: [id number] Date of Birth: Jul 14, 1986  Today's Date: 06/17/2023 OT Individual Time: 1400-1459 OT Individual Time Calculation (min): 59 min    Short Term Goals: Week 2:  OT Short Term Goal 1 (Week 2): Pt will complete 3/3 toileting tasks wiht CGA using LRAD OT Short Term Goal 2 (Week 2): Pt will maintain standing balance during functional tasks >3 minutes OT Short Term Goal 3 (Week 2): Pt will complete LB dressing min A using AD and LRAD PRN  Skilled Therapeutic Interventions/Progress Updates:     Pt directly handed off from PT in room finishing up session. Pt presenting to be in good spirits receptive to skilled OT session reporting 7/10 pain in B LEs- OT offering intermittent rest breaks, repositioning, and therapeutic support to optimize participation in therapy session. RN in/out during session to provided pain medications. Pt requesting to get cleaned up at beginning of session. Engaged Pt in standing with B UEs supported on RW during clean-up process to address [name] standing tolerance deficit. Pt able to tolerate standing ~3 minutes during clean up process with OT providing max A for posterior peri-care. Transported Pt total A to therapy gym in wc for energy conservation and time management. Remainder of therapy session focused on d/c planning and functional mobility training to increase pt's independence and safety at home. Engaged Pt in d/c planning discussion with focus on home-set, DME needs, and accessibility. Education provided on fall prevention and energy conservation techniques with Pt demonstrating understanding of topics through teach back providing application of techniques to home scenarios. Engaged Pt in completing short distance functional mobility using RW to simulate home mobility with Pt able to complete 52ft x2 trials with CGA and w/c follow provided only to decrease [name] anxiety. Prolonged seated  rest breaks provided between trials. Pt reporting he feels safe completing short distances of functional mobility at d/c. Engaged Pt in completing w/c propulsion back to his room using B LEs and B UEs for endurance training to increase overall activity tolerance for BADLs. Pt able to complete ~20 ft x3 trials with short rest breaks provided between trials. OT transported Pt remainder of the way back to his room total A in wc for energy conservation. Stand pivot wc>EOB using RW CGA. Pt was left sitting EOB with call bell in reach and all needs met. Bed alarm not on d/t Pt being cognitively intact and safe to sit EOB independently. Informed nursing staff of [name] status.   Therapy Documentation Precautions:  Precautions Precautions: Fall Precaution Comments: R sided weakness, R foot drop, O2 with activity, watch HR (has a-fib), Stage 3 wound on buttock Required Braces or Orthoses: Other Brace Other Brace: R foot-up brace, or CAM boot Restrictions Weight Bearing Restrictions: No  Therapy/Group: Individual Therapy  Phillip Young 06/17/2023, 2:32 PM

## 2023-06-17 NOTE — Progress Notes (Signed)
Patient ID: Phillip Young, male   DOB: 1986/06/19, 37 y.o.   MRN: [id number]  Sw spoke with mother, [name]. Mother will be present on Monday around 11 AM for discharge.

## 2023-06-17 NOTE — Progress Notes (Addendum)
Patient ID: Phillip Young, male   DOB: Jul 20, 1986, 37 y.o.   MRN: [id number]   PCS referral submitted to expedited request line at [phone].   11:50 AM: Sw informed by French Ana at [facility] that patient is enrolled in a managed Medicaid plan, their system showing [employer] managed Medicaid. Sw informed to reach out to [facility].

## 2023-06-17 NOTE — Plan of Care (Signed)
  Problem: RH Balance Goal: LTG Patient will maintain dynamic standing with ADLs (OT) Description: LTG:  Patient will maintain dynamic standing balance with assist during activities of daily living (OT)  Flowsheets (Taken 06/17/2023 1427) LTG: Pt will maintain dynamic standing balance during ADLs with: Contact Guard/Touching assist Note: Goal downgraded on [name] CLOF and ELOS.    Problem: Sit to Stand Goal: LTG:  Patient will perform sit to stand in prep for activites of daily living with assistance level (OT) Description: LTG:  Patient will perform sit to stand in prep for activites of daily living with assistance level (OT) Flowsheets (Taken 06/17/2023 1427) LTG: PT will perform sit to stand in prep for activites of daily living with assistance level: Contact Guard/Touching assist Note: Goal downgraded on [name] CLOF and ELOS.    Problem: RH Dressing Goal: LTG Patient will perform lower body dressing w/assist (OT) Description: LTG: Patient will perform lower body dressing with assist, with/without cues in positioning using equipment (OT) Flowsheets (Taken 06/17/2023 1427) LTG: Pt will perform lower body dressing with assistance level of: Contact Guard/Touching assist Note: Goal downgraded on [name] CLOF and ELOS.    Problem: RH Toileting Goal: LTG Patient will perform toileting task (3/3 steps) with assistance level (OT) Description: LTG: Patient will perform toileting task (3/3 steps) with assistance level (OT)  Flowsheets (Taken 06/17/2023 1427) LTG: Pt will perform toileting task (3/3 steps) with assistance level: Contact Guard/Touching assist Note: Goal downgraded on [name] CLOF and ELOS.

## 2023-06-17 NOTE — Progress Notes (Addendum)
Patient ID: Phillip Young, male   DOB: 1986/05/08, 37 y.o.   MRN: [id number]  SW informed patient HD was refused. HD appointment will be rescheduled for tonight. Patient planned to continue regular schedule tomorrow.  SW contacted mother to request switching family education session to morning session. Mother has declined morning sessions and Sunday for family education. SW will inform team.   1:40 PM:  Team updated via EPIC chat at 10:05 AM

## 2023-06-17 NOTE — Progress Notes (Signed)
Patient refused dialysis, dialysis nurse spoke with patient and patient continued to refuse. Requested for dialysis to be rescheduled for 2000 to 0100 on 11/22, so patient will be able to complete his therapies for the morning. Aware of importance of treatment and risks of not going.

## 2023-06-17 NOTE — Progress Notes (Signed)
Patient ID: Phillip Young, male   DOB: 1985-08-30, 37 y.o.   MRN: [id number]  Phillip Young held for Co-payment with [employer]. Sw will inform pt and mother. HH approved with [facility] for [facility] SN PT OT.

## 2023-06-17 NOTE — Progress Notes (Signed)
Physical Therapy Session Note  Patient Details  Name: Phillip Young MRN: [id number] Date of Birth: September 04, 1985  Today's Date: 06/17/2023 PT Individual Time: 1324-4010 PT Individual Time Calculation (min): 42 min   Short Term Goals: Week 2:  PT Short Term Goal 1 (Week 2): =LTG's  Skilled Therapeutic Interventions/Progress Updates: Patient supine asleep in bed on entrance to room. Pt required increased time and effort to become awakened. Pt eventually alert with reports that medication given prior put him "out." RN also reported that night nurse stated pt did not sleep after 3 a.m. Pt requested to change boxers to new ones. Pt requested assistance with it while supine in bed. PTA encouraged pt to perform task without aid as d/c is coming up soon and pt needs to do all that he can without assistance. Pt donned brief with increased time, then transitioned to sitting on EOB with modI (HOB elevated). Pt then required totalA to donn personal shoes. Pt sit to stand from EOB to RW with bed elevated and supervision. Pt then donned boxes up to waist and transferred to [facility] with supervision. Pt required VC to reach back to arm rest vs maintaining B UE's on RW. Pt then positioned WC in front of sink to perform self hygiene with wash clothe. RN arrived to obtain vitals and provide morning medication. Pt then positioned WC beside bed.  Pt reported that he denied dialysis the previous day because they came in the middle of the night and he did not want it to impede in his therapy sessions the next day.   Patient sitting in WC at end of session with brakes locked, belt alarm set, and all needs within reach.      Therapy Documentation Precautions:  Precautions Precautions: Fall Precaution Comments: R sided weakness, R foot drop, O2 with activity, watch HR (has a-fib), Stage 3 wound on buttock Required Braces or Orthoses: Other Brace Other Brace: R foot-up brace, or CAM boot Restrictions Weight Bearing  Restrictions: No  Therapy/Group: Individual Therapy  Danajah Birdsell PTA 06/17/2023, 11:49 AM

## 2023-06-17 NOTE — Progress Notes (Signed)
Physical Therapy Session Note  Patient Details  Name: Phillip Young MRN: [id number] Date of Birth: [birth year]  Today's Date: 06/17/2023 PT Individual Time: 1301-1405 PT Individual Time Calculation (min): 64 min   Short Term Goals: Week 1:  PT Short Term Goal 1 (Week 1): Pt will perform bed mobility with overall supervision with no c/o dizziness and change in HR <20 bpm. PT Short Term Goal 1 - Progress (Week 1): Revised due to lack of progress PT Short Term Goal 2 (Week 1): Pt will perform sit<>stand with CGA and seat height at 90/90 hip/ knee angles. PT Short Term Goal 2 - Progress (Week 1): Met PT Short Term Goal 3 (Week 1): Pt will perform stand pivot transfers with CGA/ MinA using LRAD. PT Short Term Goal 3 - Progress (Week 1): Met PT Short Term Goal 4 (Week 1): pt will ambulate at least 20 ft using LRAD with overall CGA/ MinA. PT Short Term Goal 4 - Progress (Week 1): Met Week 2:  PT Short Term Goal 1 (Week 2): =LTG's  Skilled Therapeutic Interventions/Progress Updates:  Patient seated upright on EOB on entrance to room. Patient alert and agreeable to PT session.   Patient with continued pain complaint of neuropathy pain in BLE at start of session.  Therapeutic Activity/ Gait Training: Pt does not have shoes available for ambulation with CPO for determination of AFO style. However, pt states that he will don current shoes despite swelling in feet as well as foot pain with pressure/ palpation. Pt is able to don RLE sock with setup. Shoes donned with extra time and maxA with extreme pain exhibited by pt. Unable to don sneaker to L foot and so Croc donned. R sneaker donned with foot up brace, however pain from pull of foot to ankle portion causes extreme pain which does not decrease. Pt attempts to stand but is unable. Removed R shoe and Croc donned. O2 tank acquired. MinA to stand from elevated bed height and pt demos ambulation with R foot slide forward over 18 feet with CGA and w/c  follow for fatigue. CPO recommending toe cap but discussed different options for AFO. Does not recommend foot up brace d/t pain and relates concern of foot pain with foot plate of AFO. Determines that ankle is more sensitive to touch than upper portion of lower leg just below knee. Despite pain from DF wrap during gait training earlier in week, pain is less that pull at ankle from foot up brace. May be able to tolerate AFO better.   Discussed shoes pt ordered and determined that they will not work for pt as he has chosen narrow toe box shoes. CPO recommends looking for extra wide width shoe with recommendation to look at [employer] in order to find recommended width and then look at options for size 13 not 14 so as to keep length at actual size and not add more length d/t R foot drop. Pt to look on site and choose shoes to liking. Then inform this therapist re: purchase with this therapist to pick up from local store and provide to pt.   Pt upset re: feeling dirty with potential bowel smear in underwear. Agreeable to stand for cleaning with NT and PT to assist. Positioned to complete, then stand pivot to bed. Linen have been changed by NT.   OT arrives for session and care handed off to OT.   Patient standing with RW at end of session with NT and OT present to  provide assist, and all needs within reach.   Therapy Documentation Precautions:  Precautions Precautions: Fall Precaution Comments: R sided weakness, R foot drop, O2 with activity, watch HR (has a-fib), Stage 3 wound on buttock Required Braces or Orthoses: Other Brace Other Brace: R foot-up brace, or CAM boot Restrictions Weight Bearing Restrictions: No  Pain:  Significant increase in neuropathic pain during session and address with reduction in touch and repositioning.   Therapy/Group: Individual Therapy  Loel Dubonnet PT, DPT, CSRS 06/17/2023, 6:16 PM

## 2023-06-17 NOTE — Progress Notes (Signed)
PHARMACY NOTE:  ANTIMICROBIAL RENAL DOSAGE ADJUSTMENT  Current antimicrobial regimen includes a mismatch between antimicrobial dosage and estimated renal function.  As per policy approved by the Pharmacy & Therapeutics and Medical Executive Committees, the antimicrobial dosage will be adjusted accordingly.  Current antimicrobial dosage:  cefazolin 2g IV QTTS with HD   Indication: MSSA bacteremia   Renal Function:  Estimated Creatinine Clearance: 13.8 mL/min (A) (by C-G formula based on SCr of 10.99 mg/dL (H)). [x]      On intermittent HD, scheduled: TTS  []      On CRRT    Antimicrobial dosage has been changed to:  cefazolin 1g IV daily   Additional comments: Patient refused HD overnight d/t preference. Will reschedule cefazolin to daily dosing until HD schedule is well-established tpo prevent interruption in therapy for MSSA bacteremia.    Thank you for allowing pharmacy to be a part of this patient's care.  Jani Gravel, PharmD Clinical Pharmacist  06/17/2023 9:43 AM

## 2023-06-17 NOTE — Progress Notes (Signed)
Patient ID: Phillip Young, male   DOB: [birth year], 37 y.o.   MRN: [id number]  Sw met with patient to discuss Bari RW. Patient believes that he may potentially have a [place] RW with 2 wheels. Patient would like to confirm with his mother that the device has 2 wheels vs 4. Sw will follow up with patient. No additional questions or concerns.

## 2023-06-17 NOTE — Progress Notes (Signed)
Orthopedic Tech Progress Note Patient Details:  Phillip Young 1986/07/22 [id number]  Called in order to [facility] for an AFO CONSULT   Patient ID: Phillip Young, male   DOB: 01/17/1986, 37 y.o.   MRN: [id number]  Phillip Young 06/17/2023, 8:41 PM

## 2023-06-17 NOTE — Progress Notes (Signed)
Contacted [facility] to advise clinic of pt's d/c on Monday and that pt should resume on Tuesday (due to holiday schedule).   Olivia Canter Renal Navigator [phone]

## 2023-06-17 NOTE — Progress Notes (Addendum)
PROGRESS NOTE   Subjective/Complaints:  Patient continues to report nerve pain in his feet, burning on the left and more numb feeling right.  No additional concerns this morning.  ROS: Patient denies fever, chills, SOB, DOE, feels needs to increase O2 at noc , No GI distress      Objective:   DG Ankle 2 Views Left  Result Date: 06/16/2023 CLINICAL DATA:  Left ankle pain. EXAM: LEFT ANKLE - 2 VIEW COMPARISON:  None Available. FINDINGS: The bones are subjectively under mineralized. The alignment is normal. No fracture. Mild chronic spurring of the medial malleolus. No ankle joint effusion. No erosive change or focal bone abnormality. No focal soft tissue abnormalities. IMPRESSION: 1. Mild chronic spurring of the medial malleolus. No acute osseous abnormality. 2. Subjective osteopenia/osteoporosis. Electronically Signed   By: Narda Rutherford M.D.   On: 06/16/2023 16:42   DG Toe Great Left  Result Date: 06/16/2023 CLINICAL DATA:  Left great toe pain. EXAM: LEFT GREAT TOE COMPARISON:  None Available. FINDINGS: The bones are subjectively under mineralized. There is no evidence of fracture or dislocation. Mild degenerative spurring of the interphalangeal joint. No erosions or bone destruction. Peripheral vascular calcifications. IMPRESSION: 1. Mild degenerative spurring of the interphalangeal joint. 2. Peripheral vascular disease. Electronically Signed   By: Narda Rutherford M.D.   On: 06/16/2023 16:41   No results for input(s): "WBC", "HGB", "HCT", "PLT" in the last 72 hours.   No results for input(s): "NA", "K", "CL", "CO2", "GLUCOSE", "BUN", "CREATININE", "CALCIUM" in the last 72 hours.   No intake or output data in the 24 hours ending 06/17/23 1724    Pressure Injury 05/18/23 Buttocks Right Stage 3 -  Full thickness tissue loss. Subcutaneous fat may be visible but bone, tendon or muscle are NOT exposed. (Active)  05/18/23 2300   Location: Buttocks  Location Orientation: Right  Staging: Stage 3 -  Full thickness tissue loss. Subcutaneous fat may be visible but bone, tendon or muscle are NOT exposed.  Wound Description (Comments):   Present on Admission: Yes (original date 05/18/23; present on admit to CIR 06/03/23)     Pressure Injury 05/18/23 Buttocks Left Stage 2 -  Partial thickness loss of dermis presenting as a shallow open injury with a red, pink wound bed without slough. (Active)  05/18/23 2300  Location: Buttocks  Location Orientation: Left  Staging: Stage 2 -  Partial thickness loss of dermis presenting as a shallow open injury with a red, pink wound bed without slough.  Wound Description (Comments):   Present on Admission: Yes (original date 05/18/23; present on admit to CIR 06/03/23)    Physical Exam: Vital Signs Blood pressure 131/85, pulse 61, temperature 98 F (36.7 C), temperature source Oral, resp. rate 17, height 6' (1.829 m), weight (!) 152.2 kg, SpO2 95%.   General: No acute distress, sitting in wheelchair.  Obese Mood and affect are appropriate Heart: Regular rate and rhythm no rubs murmurs or extra sounds Lungs: Clear to auscultation, breathing unlabored, no rales or wheezes Abdomen: Positive bowel sounds, soft nontender to palpation, nondistended Extremities: 1+ edema B/L LE Neurologic: Alert and awake, cranial nerves II through XII grossly intact Altered sensation  bilateral feet  Prior exam L/R buttock wounds dressed Neurologic: Cranial nerves II through XII intact, motor strength is 5/5 in bilateral deltoid, bicep, tricep, grip, 0/5 right  ankle dorsiflexor and plantar flexor 5/5 RIght quad 4/5 R hamstring, 5/5 left quad and hamstring and ankle  Sensory exam absent LT sensation RIght foot/ankle , no proprio Right toes , partial proprio at R ankle ,   Left foot intact sensation light touch is diminished over the left great toe, proprioception is diminished in the left great toe as  well he does have intact proprioception at the ankle.  Musculoskeletal: No evidence of MTP swelling on the left side no swelling at the ankle joint.  There is evidence of peripheral edema 1+ both feet Is tight at end range with abd and ext rotation and arm flexion  Assessment/Plan: 1. Functional deficits which require 3+ hours per day of interdisciplinary therapy in a comprehensive inpatient rehab setting. Physiatrist is providing close team supervision and 24 hour management of active medical problems listed below. Physiatrist and rehab team continue to assess barriers to discharge/monitor patient progress toward functional and medical goals  Care Tool:  Bathing    Body parts bathed by patient: Right arm, Left arm, Chest, Abdomen, Front perineal area, Face   Body parts bathed by helper: Left lower leg, Right lower leg, Left upper leg, Buttocks, Right upper leg     Bathing assist Assist Level: Supervision/Verbal cueing     Upper Body Dressing/Undressing Upper body dressing   What is the patient wearing?: Pull over shirt    Upper body assist Assist Level: Set up assist    Lower Body Dressing/Undressing Lower body dressing      What is the patient wearing?: Pants, Underwear/pull up     Lower body assist Assist for lower body dressing: Contact Guard/Touching assist     Toileting Toileting    Toileting assist Assist for toileting: Supervision/Verbal cueing     Transfers Chair/bed transfer  Transfers assist     Chair/bed transfer assist level: Contact Guard/Touching assist     Locomotion Ambulation   Ambulation assist      Assist level: Minimal Assistance - Patient > 75% Assistive device: Walker-rolling Max distance: 8 ft   Walk 10 feet activity   Assist  Walk 10 feet activity did not occur: Safety/medical concerns        Walk 50 feet activity   Assist Walk 50 feet with 2 turns activity did not occur: Safety/medical concerns         Walk  150 feet activity   Assist Walk 150 feet activity did not occur: Safety/medical concerns         Walk 10 feet on uneven surface  activity   Assist Walk 10 feet on uneven surfaces activity did not occur: Safety/medical concerns         Wheelchair     Assist Is the patient using a wheelchair?: Yes Type of Wheelchair: Manual    Wheelchair assist level: Dependent - Patient 0% Max wheelchair distance: 400 ft    Wheelchair 50 feet with 2 turns activity    Assist        Assist Level: Dependent - Patient 0%   Wheelchair 150 feet activity     Assist      Assist Level: Dependent - Patient 0%   Blood pressure 131/85, pulse 61, temperature 98 F (36.7 C), temperature source Oral, resp. rate 17, height 6' (1.829 m), weight (!) 152.2 kg, SpO2 95%.  Medical Problem List and Plan: 1. Functional deficits secondary to debility from MSSA bacteremia, c/b multiple medical comorbidities             -patient may shower with buttocks wound and lines covered             -ELOS/Goals: 11/26 , supervision PT/OT,   -Continue CIR therapies including PT, OT    Spoke with patient as well as physical therapy today.  Patient is willing to stay longer if additional goals can be achieved.  PT states that the patient made some great strides yesterday.  Will continue to monitor therapy progress to determine whether additional days are needed to upgrade goals.  OT input  would be helpful as well  2.  Antithrombotics: -DVT/anticoagulation:  Pharmaceutical: Eliquis 5 mg BID             -antiplatelet therapy: none   3. Pain Management: Tylenol as needed             -PO Dilaudid initiated on acute side due to nausea with oxycodone--> On admission patient requests to switch back, changed back to oxycodone 15 mg Q4H PRN and Dced Dilaudid, Increasing Oxy use to TID hope to wean this if patient can get good relief with pregabalin              -   lidocaine cream, discussed pregabalin which  pt declines at this time   -have discussed etiology of numbness in legs.  -DC gabapentin and start pregabalin 50 mg will give an extra 50 mg after dialysis as well -Monitor response to medication change to Lyrica              4. Mood/Behavior/Sleep: LCSW to evaluate and provide emotional support             -continue melatonin 5 mg q HS             -antipsychotic agents: n/a          -11/11 Start alprazolam 0.25mg  PRN BID for anxiety Neuropsych following pt  5. Neuropsych/cognition: This patient is capable of making decisions on his own behalf.              -     6. Skin/Wound Care: Routine skin care checks             -pressure injury left buttock/posterior thigh>>continue wound care              - Offered LAL mattress given HX pressure wounds; patient declines- RIght buttocks wound has been healing well with standard mattress   7. Fluids/Electrolytes/Nutrition: Routine Is and Os and follow-up chemistries -Renal diet with 1200 cc fluid restriction             -Continue Fosrenol 2000 mg 3 times daily with meals   8: Hypotension: monitor TID and prn             -continue midodrine 30 mg 3 times daily with meals Encourage patient to discuss dosing with nephrology. No current orthostatic drops on current dose   -BP stable, continue current regimen   Vitals:   06/17/23 0428 06/17/23 1507  BP: 101/67 131/85  Pulse: 70 61  Resp: 16 17  Temp: 98.4 F (36.9 C) 98 F (36.7 C)  SpO2: 100% 95%      9: pAF: on Eliquis              -unable to tolerate amiodarone (requires midodrine for BP so  not a candidate for nodal agents)On M-W-F digoxin    10: Hypothyroidism: Continue Synthroid 25 mcg daily   11: Chronic systolic heart failure: EF ~30-35%             -continue digoxin 0.125 mg  3 x weekly             -daily weight             11/16 -volume mgt>>HD 3 x weekly--pt refused Friday due to timing.  Now on Tu Th Sa    Filed Weights   06/11/23 1445 06/14/23 0526 06/15/23 0804   Weight: (!) 153.6 kg (!) 155.9 kg (!) 152.2 kg      12: Chronic hypoxic respiratory failure: on home O2 5 L via [place]   13: LE neuropathy/ R foot drop: CAM boot              - Pattern of weakness and sensory loss consistent with sciatic neuropathy above the knee (mild weakness  hamstrings); likely compressive from ICU stay  - hopeful for improvement and likely will need EMG as OP -11/11 Continue TED hoses patient reports this is helping his neuropathic pain.  Gabapentin dose limited by renal function pt feels dosing after HD is more helpful .  Could consider trying Lyrica instead of gabapentin if pain worsens  Not a diabetic, possible uremic neuropathy given some missed HD treatments Right LE symptoms sensory and motor associated with deep decubitus over the right buttocks Pt may have had compressive neuropathy of Left sciatic nerve , just not as severe    14: Morbid Obesity: Body mass index is 45.51 kg/m.    15: Anemia: on ESP   16: History or HIT positivity>>no heparin   17: MSSA bacteremia: will continue Ancef 2 g with HD treatment 3x/week (through 12/09)   18. Vocal hoarseness s/p extubation. Consider ENT evaluation if no improvement.   11/16-discussed etiology and potential plan with pt today--likely outpt ENT referral if still symptomatic at that point 19.  ESRD on HD- nephro to manage   -Reviewed nephrology note  20. Insomnia  -  melatonin 5mg  somewhat helpful 21.  Right shoulder pain exam consistent with Right shoulder impingement syndrome vs tear-  xray shows mild R AC jt OA.  MRI shows intact rotator cuff, + adhesive capsulitis  22.  Left foot and ankle pain.  Mainly left great toe MTP does not look inflamed pain is mainly with weightbearing.  Suspect that this is mainly due to neuropathic pain but will check x-rays as well.  He has a history of morbid obesity and could have  Degenerative changes despite his age   53 days A FACE TO FACE EVALUATION WAS PERFORMED  Fanny Dance 06/17/2023, 5:24 PM

## 2023-06-17 NOTE — Progress Notes (Signed)
Physical Therapy Session Note  Patient Details  Name: Phillip Young MRN: [id number] Date of Birth: 10/20/1985  Today's Date: 06/17/2023 PT Individual Time: 1117-1200 PT Individual Time Calculation (min): 43 min   Short Term Goals: Week 1:  PT Short Term Goal 1 (Week 1): Pt will perform bed mobility with overall supervision with no c/o dizziness and change in HR <20 bpm. PT Short Term Goal 1 - Progress (Week 1): Revised due to lack of progress PT Short Term Goal 2 (Week 1): Pt will perform sit<>stand with CGA and seat height at 90/90 hip/ knee angles. PT Short Term Goal 2 - Progress (Week 1): Met PT Short Term Goal 3 (Week 1): Pt will perform stand pivot transfers with CGA/ MinA using LRAD. PT Short Term Goal 3 - Progress (Week 1): Met PT Short Term Goal 4 (Week 1): pt will ambulate at least 20 ft using LRAD with overall CGA/ MinA. PT Short Term Goal 4 - Progress (Week 1): Met Week 2:  PT Short Term Goal 1 (Week 2): =LTG's  Skilled Therapeutic Interventions/Progress Updates:  Patient supine in bed on entrance to room. Patient alert and agreeable to PT session. But has pain complaint in BLE. Relates need for pain medication. Related to RN but pt has already received neuropathy pain medication earlier in morning and has recently received opioid pain medication.   Therapeutic Activity: Bed Mobility: Pt performed supine > sit with supervision. Pillow placed on floor for improved pain mgmt for neuropathy. Pt demos fatigue with eyes closing  and potential LOB. Assisted back to supine for safety requiring Min/ ModA for BLE to bed surface. VC/ tc required for technique and lift of LEs to bed surface.   Pt relates shoes were ordered but not have not arrived yet for this day's afternoon session with [facility] for gait assessment and AFO recommendation. Discussion with pt re: potential for arrival prior to d/c. Pt relates that he will do anything he has to do to get current shoes on and attempt ambulation  for proper recommendation.   Pt becomes emotionally labile and starts to c/o past health difficulties and hospitalizations/ facilities he's been to in past couple of years and laments CLOF. Pt provided with therapeutic listening and provided with positive encouragement as well as education/ reminder that pt needs to focus on future and continue to push self for continued progress. Relates that if therapy team wants him to stay in therapy, then he will stay for continued improvement. Informed pt that this therapist will relate this to primary care team.   Pt continues to nod off during session. Patient supine in bed at end of session with brakes locked, bed alarm set, and all needs within reach.   Therapy Documentation Precautions:  Precautions Precautions: Fall Precaution Comments: R sided weakness, R foot drop, O2 with activity, watch HR (has a-fib), Stage 3 wound on buttock Required Braces or Orthoses: Other Brace Other Brace: R foot-up brace, or CAM boot Restrictions Weight Bearing Restrictions: No General:   Vital Signs:   Pain: Pain Assessment Pain Scale: 0-10 Pain Score: 6  Pain Type: Acute pain Pain Location: Foot Pain Descriptors / Indicators: Aching Pain Frequency: Constant Pain Onset: On-going Patients Stated Pain Goal: 4 Pain Intervention(s): Medication (See eMAR)  Therapy/Group: Individual Therapy  Loel Dubonnet PT, DPT, CSRS 06/17/2023, 11:32 AM

## 2023-06-17 NOTE — Progress Notes (Signed)
Patient ID: Phillip Young, male   DOB: 01-07-1986, 37 y.o.   MRN: [id number]  Updated d/c address: [address]  [place], [place]

## 2023-06-17 NOTE — Discharge Instructions (Signed)
Inpatient Rehab Discharge Instructions  Phillip Young Discharge date and time: 06/20/2023   Activities/Precautions/ Functional Status: Activity: no lifting, driving, or strenuous exercise until cleared by MD Diet: renal diet; 1200 cc fluid restriction Wound Care: as directed Functional status:  ___ No restrictions     ___ Walk up steps independently _x__ 24/7 supervision/assistance   ___ Walk up steps with assistance ___ Intermittent supervision/assistance  ___ Bathe/dress independently ___ Walk with walker     ___ Bathe/dress with assistance ___ Walk Independently    ___ Shower independently ___ Walk with assistance    _x__ Shower with assistance _x__ No alcohol     ___ Return to work/school ________  Special Instructions: No driving, alcohol consumption or tobacco use.  My questions have been answered and I understand these instructions. I will adhere to these goals and the provided educational materials after my discharge from the hospital.  Patient/Caregiver Signature _______________________________ Date __________  Clinician Signature _______________________________________ Date __________  Please bring this form and your medication list with you to all your follow-up doctor's appointments.

## 2023-06-17 NOTE — Progress Notes (Signed)
  [facility] Progress Note   Subjective: no c/o today.   Objective Vitals:   06/16/23 0643 06/16/23 1608 06/16/23 1929 06/17/23 0428  BP: 112/66 112/73 131/89 101/67  Pulse: 83 69 81 70  Resp: 18 16 18 16   Temp: 98.1 F (36.7 C) 98 F (36.7 C) 98.4 F (36.9 C) 98.4 F (36.9 C)  TempSrc:  Oral    SpO2: 100% 100% 95% 100%  Weight:      Height:       Physical Exam: General: Alert obese male NAD Heart: RRR no MRG Lungs: CTAB  Abdomen: Obese NABS soft NT ND Extremities: BLE edema Dialysis Access: TDC intact    OP  dialysis Orders: MWF at [facility] 4.5h   400/800    172kg   2K/2Ca bath   TDC   No heparin (+ HIT)   Assessment/Plan: 1. Acute hypoxic respiratory failure: Remains on supplemental oxygen and ongoing efforts at continued ultrafiltration/lowering EDW with dialysis (anticipating that he has sustained weight loss during this hospitalization). He is stable at baseline O2 requirement now.  2. MSSA Bacteremia: TDC replaced after he underwent line holiday 05/26/23-05/30/23.  Continue cefazolin IV per pmd.  2. ESRD: HD MWF schedule but changed to TTS for this week. Refused HD yesterday, wants to do it tonight between 8p and 1am. I told him I'm not sure if we will be able to accommodate his request and that the HD schedule is up to the dialysis staff. Next week will change back to MWF which, with the holiday schedule, will be [facility] -Tues- Friday next week.  3. HTN/volume: He remains on midodrine 30mg  TID 4. Anemia: Hgb 10.1 at goal, currently on  ESA 150 weekly --> we decreased to 60 with Hgb over 10 5. Secondary hyperparathyroidism: Calcium level elevated and VDRA remains on hold, phosphorus 4.7 lanthanum  Phos binder 6. Nutrition:  Continue renal diet and protein supplements 7. H/o HIT: no heparin w/ dialysis, block catheter w/ sodium citrate 8.  Deconditioning right foot drop- rehab  with CIR  Disposition: Planning to DC possibly 06/20/2023. [facility] M[facility]  Vinson Moselle  MD  [facility] 06/17/2023, 1:33 PM  Recent Labs  Lab 06/10/23 1414 06/11/23 1348 06/14/23 1153  HGB 11.1* 10.1* 10.8*  ALBUMIN 3.1*  --  3.0*  CALCIUM 10.4* 10.0 9.7  PHOS 4.7*  --  6.5*  CREATININE 8.62* 10.18* 10.99*  K 4.2 4.0 5.3*

## 2023-06-18 DIAGNOSIS — F54 Psychological and behavioral factors associated with disorders or diseases classified elsewhere: Secondary | ICD-10-CM

## 2023-06-18 LAB — RENAL FUNCTION PANEL
Albumin: 2.8 g/dL — ABNORMAL LOW (ref 3.5–5.0)
Anion gap: 15 (ref 5–15)
BUN: 46 mg/dL — ABNORMAL HIGH (ref 6–20)
CO2: 25 mmol/L (ref 22–32)
Calcium: 9.4 mg/dL (ref 8.9–10.3)
Chloride: 97 mmol/L — ABNORMAL LOW (ref 98–111)
Creatinine, Ser: 11.24 mg/dL — ABNORMAL HIGH (ref 0.61–1.24)
GFR, Estimated: 5 mL/min — ABNORMAL LOW (ref >60)
Glucose, Bld: 92 mg/dL (ref 70–99)
Phosphorus: 7 mg/dL — ABNORMAL HIGH (ref 2.5–4.6)
Potassium: 5.4 mmol/L — ABNORMAL HIGH (ref 3.5–5.1)
Sodium: 137 mmol/L (ref 135–145)

## 2023-06-18 LAB — CBC
HCT: 31 % — ABNORMAL LOW (ref 39.0–52.0)
Hemoglobin: 9.6 g/dL — ABNORMAL LOW (ref 13.0–17.0)
MCH: 31 pg (ref 26.0–34.0)
MCHC: 31 g/dL (ref 30.0–36.0)
MCV: 100 fL (ref 80.0–100.0)
Platelets: 205 10*3/uL (ref 150–400)
RBC: 3.1 MIL/uL — ABNORMAL LOW (ref 4.22–5.81)
RDW: 15.5 % (ref 11.5–15.5)
WBC: 6.2 10*3/uL (ref 4.0–10.5)
nRBC: 0 % (ref 0.0–0.2)

## 2023-06-18 MED ORDER — NEPRO/CARBSTEADY PO LIQD: 237.0000 mL | ORAL | Status: DC | PRN

## 2023-06-18 MED ORDER — PENTAFLUOROPROP-TETRAFLUOROETH EX AERO: 1.0000 | INHALATION_SPRAY | CUTANEOUS | Status: DC | PRN

## 2023-06-18 MED ORDER — CHLORHEXIDINE GLUCONATE CLOTH 2 % EX PADS: 6.0000 | MEDICATED_PAD | Freq: Every day | CUTANEOUS | Status: DC

## 2023-06-18 MED ORDER — LIDOCAINE-PRILOCAINE 2.5-2.5 % EX CREA: 1.0000 | TOPICAL_CREAM | CUTANEOUS | Status: DC | PRN

## 2023-06-18 MED ORDER — CHLORHEXIDINE GLUCONATE CLOTH 2 % EX PADS
6.0000 | MEDICATED_PAD | Freq: Every day | CUTANEOUS | Status: DC
  Administered 2023-06-19: 6 via TOPICAL

## 2023-06-18 MED ORDER — LIDOCAINE HCL (PF) 1 % IJ SOLN: 5.0000 mL | INTRAMUSCULAR | Status: DC | PRN

## 2023-06-18 MED ORDER — HEPARIN SODIUM (PORCINE) 1000 UNIT/ML DIALYSIS: 1000.0000 [IU] | INTRAMUSCULAR | Status: DC | PRN

## 2023-06-18 MED ORDER — ANTICOAGULANT SODIUM CITRATE 4% (200MG/5ML) IV SOLN
5.0000 mL | Status: DC | PRN
  Filled 2023-06-18: qty 5

## 2023-06-18 MED ORDER — ALTEPLASE 2 MG IJ SOLR: 2.0000 mg | Freq: Once | INTRAMUSCULAR | Status: DC | PRN

## 2023-06-18 NOTE — Progress Notes (Signed)
PROGRESS NOTE   Subjective/Complaints:  No events overnight.  Vital stable.  Patient endorsing ongoing bilateral lower extremity burning pain, which is extremely bothersome and limits his ability to tolerate clothing or even bedsheets.  Overall, however, he is extremely thankful to staff at the rehab in general for taking care of him and explaining his care so well.  ROS: Bilateral lower extremity neuropathic pain -severe, ongoing.  Patient denies fever, chills, SOB,, No GI distress      Objective:   No results found. Recent Labs    06/18/23 0513  WBC 6.2  HGB 9.6*  HCT 31.0*  PLT 205     Recent Labs    06/18/23 1420  NA 137  K 5.4*  CL 97*  CO2 25  GLUCOSE 92  BUN 46*  CREATININE 11.24*  CALCIUM 9.4      Intake/Output Summary (Last 24 hours) at 06/18/2023 2226 Last data filed at 06/18/2023 1821 Gross per 24 hour  Intake 0 ml  Output 4000 ml  Net -4000 ml      Pressure Injury 05/18/23 Buttocks Right Stage 3 -  Full thickness tissue loss. Subcutaneous fat may be visible but bone, tendon or muscle are NOT exposed. (Active)  05/18/23 2300  Location: Buttocks  Location Orientation: Right  Staging: Stage 3 -  Full thickness tissue loss. Subcutaneous fat may be visible but bone, tendon or muscle are NOT exposed.  Wound Description (Comments):   Present on Admission: Yes (original date 05/18/23; present on admit to CIR 06/03/23)     Pressure Injury 05/18/23 Buttocks Left Stage 2 -  Partial thickness loss of dermis presenting as a shallow open injury with a red, pink wound bed without slough. (Active)  05/18/23 2300  Location: Buttocks  Location Orientation: Left  Staging: Stage 2 -  Partial thickness loss of dermis presenting as a shallow open injury with a red, pink wound bed without slough.  Wound Description (Comments):   Present on Admission: Yes (original date 05/18/23; present on admit to CIR 06/03/23)     Physical Exam: Vital Signs Blood pressure 93/65, pulse 97, temperature 98.7 F (37.1 C), resp. rate 18, height 6' (1.829 m), weight (!) 159 kg, SpO2 99%.   General: No acute distress, laying in bed.  Morbidly obese. Mood and affect are appropriate; intermittently tearful. Heart: Regular rate and rhythm no rubs murmurs or extra sounds Lungs: Clear to auscultation, breathing unlabored, no rales or wheezes Abdomen: Positive bowel sounds, soft nontender to palpation, nondistended Extremities: 1+ edema B/L LE-unchanged Neurologic: Alert and awake, cranial nerves II through XII grossly intact Altered sensation bilateral feet, with extreme hypersensitivity Moving bilateral upper extremities antigravity against resistance; right lower extremity absent dorsiflexion plantarflexion, left lower extremity 4 out of 5 throughout limited by pain with movement Plantar flexor tone in right lower extremity, MAS 2  Prior exam L/R buttock wounds dressed Neurologic: Cranial nerves II through XII intact, motor strength is 5/5 in bilateral deltoid, bicep, tricep, grip, 0/5 right  ankle dorsiflexor and plantar flexor 5/5 RIght quad 4/5 R hamstring, 5/5 left quad and hamstring and ankle  Sensory exam absent LT sensation RIght foot/ankle , no proprio Right toes ,  partial proprio at R ankle ,   Left foot intact sensation light touch is diminished over the left great toe, proprioception is diminished in the left great toe as well he does have intact proprioception at the ankle.  Musculoskeletal: No evidence of MTP swelling on the left side no swelling at the ankle joint.  There is evidence of peripheral edema 1+ both feet Is tight at end range with abd and ext rotation and arm flexion  Assessment/Plan: 1. Functional deficits which require 3+ hours per day of interdisciplinary therapy in a comprehensive inpatient rehab setting. Physiatrist is providing close team supervision and 24 hour management of active  medical problems listed below. Physiatrist and rehab team continue to assess barriers to discharge/monitor patient progress toward functional and medical goals  Care Tool:  Bathing    Body parts bathed by patient: Right arm, Left arm, Chest, Abdomen, Front perineal area, Face   Body parts bathed by helper: Left lower leg, Right lower leg, Left upper leg, Buttocks, Right upper leg     Bathing assist Assist Level: Supervision/Verbal cueing     Upper Body Dressing/Undressing Upper body dressing   What is the patient wearing?: Pull over shirt    Upper body assist Assist Level: Set up assist    Lower Body Dressing/Undressing Lower body dressing      What is the patient wearing?: Pants, Underwear/pull up     Lower body assist Assist for lower body dressing: Contact Guard/Touching assist     Toileting Toileting    Toileting assist Assist for toileting: Supervision/Verbal cueing     Transfers Chair/bed transfer  Transfers assist     Chair/bed transfer assist level: Contact Guard/Touching assist     Locomotion Ambulation   Ambulation assist      Assist level: Minimal Assistance - Patient > 75% Assistive device: Walker-rolling Max distance: 8 ft   Walk 10 feet activity   Assist  Walk 10 feet activity did not occur: Safety/medical concerns        Walk 50 feet activity   Assist Walk 50 feet with 2 turns activity did not occur: Safety/medical concerns         Walk 150 feet activity   Assist Walk 150 feet activity did not occur: Safety/medical concerns         Walk 10 feet on uneven surface  activity   Assist Walk 10 feet on uneven surfaces activity did not occur: Safety/medical concerns         Wheelchair     Assist Is the patient using a wheelchair?: Yes Type of Wheelchair: Manual    Wheelchair assist level: Dependent - Patient 0% Max wheelchair distance: 400 ft    Wheelchair 50 feet with 2 turns  activity    Assist        Assist Level: Dependent - Patient 0%   Wheelchair 150 feet activity     Assist      Assist Level: Dependent - Patient 0%   Blood pressure 93/65, pulse 97, temperature 98.7 F (37.1 C), resp. rate 18, height 6' (1.829 m), weight (!) 159 kg, SpO2 99%.  Medical Problem List and Plan: 1. Functional deficits secondary to debility from MSSA bacteremia, c/b multiple medical comorbidities             -patient may shower with buttocks wound and lines covered             -ELOS/Goals: 11/26 , supervision PT/OT,   -Continue CIR therapies including PT,  OT    Spoke with patient as well as physical therapy today.  Patient is willing to stay longer if additional goals can be achieved.  PT states that the patient made some great strides yesterday.  Will continue to monitor therapy progress to determine whether additional days are needed to upgrade goals.  OT input  would be helpful as well  2.  Antithrombotics: -DVT/anticoagulation:  Pharmaceutical: Eliquis 5 mg BID             -antiplatelet therapy: none   3. Pain Management: Tylenol as needed             -PO Dilaudid initiated on acute side due to nausea with oxycodone--> On admission patient requests to switch back, changed back to oxycodone 15 mg Q4H PRN and Dced Dilaudid, Increasing Oxy use to TID hope to wean this if patient can get good relief with pregabalin              -   lidocaine cream, discussed pregabalin which pt declines at this time   -have discussed etiology of numbness in legs.  -DC gabapentin and start pregabalin 50 mg will give an extra 50 mg after dialysis as well -Monitor response to medication change to Lyrica 11-23: No appreciable change with Lyrica, was only started Thursday so we will give the weekend to monitor for response              4. Mood/Behavior/Sleep: LCSW to evaluate and provide emotional support             -continue melatonin 5 mg q HS             -antipsychotic agents:  n/a          -11/11 Start alprazolam 0.25mg  PRN BID for anxiety Neuropsych following pt  5. Neuropsych/cognition: This patient is capable of making decisions on his own behalf.              -     6. Skin/Wound Care: Routine skin care checks             -pressure injury left buttock/posterior thigh>>continue wound care              - Offered LAL mattress given HX pressure wounds; patient declines- RIght buttocks wound has been healing well with standard mattress   7. Fluids/Electrolytes/Nutrition: Routine Is and Os and follow-up chemistries -Renal diet with 1200 cc fluid restriction             -Continue Fosrenol 2000 mg 3 times daily with meals   8: Hypotension: monitor TID and prn             -continue midodrine 30 mg 3 times daily with meals Encourage patient to discuss dosing with nephrology. No current orthostatic drops on current dose   -BP stable, continue current regimen 11-23: BP stable   Vitals:   06/18/23 1816 06/18/23 2028  BP: (!) 118/54 93/65  Pulse:  97  Resp: (!) 25 18  Temp:  98.7 F (37.1 C)  SpO2: 100% 99%      9: pAF: on Eliquis              -unable to tolerate amiodarone (requires midodrine for BP so not a candidate for nodal agents)On M-W-F digoxin    10: Hypothyroidism: Continue Synthroid 25 mcg daily   11: Chronic systolic heart failure: EF ~30-35%             -continue digoxin  0.125 mg  3 x weekly             -daily weight             11/16 -volume mgt>>HD 3 x weekly--pt refused Friday due to timing.  Now on Tu Th Sa    Filed Weights   06/18/23 0500 06/18/23 1418 06/18/23 1822  Weight: (!) 160.8 kg (!) 163 kg (!) 159 kg      12: Chronic hypoxic respiratory failure: on home O2 5 L via [place]   13: LE neuropathy/ R foot drop: CAM boot              - Pattern of weakness and sensory loss consistent with sciatic neuropathy above the knee (mild weakness  hamstrings); likely compressive from ICU stay  - hopeful for improvement and likely will need  EMG as OP -11/11 Continue TED hoses patient reports this is helping his neuropathic pain.  Gabapentin dose limited by renal function pt feels dosing after HD is more helpful .  Could consider trying Lyrica instead of gabapentin if pain worsens  Not a diabetic, possible uremic neuropathy given some missed HD treatments Right LE symptoms sensory and motor associated with deep decubitus over the right buttocks Pt may have had compressive neuropathy of Left sciatic nerve , just not as severe  11-23: Enforced use of PRAFO to prevent right lower extremity plantarflexion tone   14: Morbid Obesity: Body mass index is 47.54 kg/m.    15: Anemia: on ESP   16: History or HIT positivity>>no heparin   17: MSSA bacteremia: will continue Ancef 2 g with HD treatment 3x/week (through 12/09)   18. Vocal hoarseness s/p extubation. Consider ENT evaluation if no improvement.   11/16-discussed etiology and potential plan with pt today--likely outpt ENT referral if still symptomatic at that point 19.  ESRD on HD- nephro to manage   -Reviewed nephrology note  20. Insomnia  -  melatonin 5mg  somewhat helpful  21.  Right shoulder pain exam consistent with Right shoulder impingement syndrome vs tear-  xray shows mild R AC jt OA.  MRI shows intact rotator cuff, + adhesive capsulitis  22.  Left foot and ankle pain.  Mainly left great toe MTP does not look inflamed pain is mainly with weightbearing.  Suspect that this is mainly due to neuropathic pain but will check x-rays as well.  He has a history of morbid obesity and could have  Degenerative changes despite his age 24-23: No complaints today   15 days A FACE TO FACE EVALUATION WAS PERFORMED  Angelina Sheriff 06/18/2023, 10:26 PM

## 2023-06-18 NOTE — Progress Notes (Signed)
Received patient in bed to unit.  Alert and oriented.  Informed consent signed and in chart.   TX duration:3.5 hours  Patient tolerated well.  Transported back to the room  Alert, without acute distress.  Hand-off given to patient's nurse.   Access used: R internal jugular HD cath Access issues: none  Total UF removed: 4L Medication(s) given: none   06/18/23 1800  Vitals  Temp 98.2 F (36.8 C)  Temp Source Oral  BP 103/60  BP Location Right Arm  BP Method Automatic  Patient Position (if appropriate) Lying  Pulse Rate 86  Pulse Rate Source Monitor  MEWS COLOR  MEWS Score Color Green  Oxygen Therapy  SpO2 100 %  O2 Device Nasal Cannula  O2 Flow Rate (L/min) 4 L/min  MEWS Score  MEWS Temp 0  MEWS Systolic 0  MEWS Pulse 0  MEWS RR 0  MEWS LOC 0  MEWS Score 0     Stacie Glaze LPN [facility]

## 2023-06-18 NOTE — Discharge Summary (Signed)
Physical Therapy Discharge Summary  Patient Details  Name: Phillip Young MRN: [id number] Date of Birth: 09-Jun-1986  Date of Discharge from PT service:{Time; dates multiple:304500300}  {CHL IP REHAB PT TIME CALCULATION:304800500}   Patient has met {NUMBERS 0-12:18577} of {NUMBERS 0-12:18577} long term goals due to {due GE:9528413}.  Patient to discharge at a household ambulatory level  CGA  using bari-RW. Pt requires wheelchair transport from car to home entrance. Per patient, his care partners are independent to provide the necessary physical assistance at discharge.  Reasons goals not met: ***  Recommendation:  Patient will benefit from ongoing skilled PT services in home health setting to continue to advance safe functional mobility, address ongoing impairments in endurance, dynamic standing balance, gait training, B LE strengthening, addressing R foot drop AFO needs once pt has shoes, and minimize fall risk.  Equipment: No equipment provided; pt already has full electric hospital bed, bari-RW, and wheelchair  Reasons for discharge: treatment goals met and discharge from hospital  Patient/family agrees with progress made and goals achieved: Yes  PT Discharge Precautions/Restrictions Precautions Precautions: Fall;Other (comment) Precaution Comments: R sided weakness, R foot drop, O2 via nasal cannula at rest and with activity baseline, pressure wound on buttock Required Braces or Orthoses: Other Brace Other Brace: needs support for R foot drop Restrictions Weight Bearing Restrictions: No Pain  Pain Assessment Pain Scale: 0-10 Pain Score: 8  Faces Pain Scale: No hurt Pain Type: Neuropathic pain Pain Location: Leg Pain Orientation: Right;Left Pain Descriptors / Indicators: Tingling Pain Frequency: Constant Pain Onset: On-going Patients Stated Pain Goal: 3 Pain Intervention(s): Medication (See eMAR);Distraction;Emotional support;Repositioned Pain Interference Pain  Interference Pain Effect on Sleep: 3. Frequently Pain Interference with Therapy Activities: 1. Rarely or not at all (pt states "I push myself") Pain Interference with Day-to-Day Activities: 3. Frequently Vision/Perception  Vision - History Ability to See in Adequate Light: 0 Adequate Perception Perception: Within Functional Limits Praxis Praxis: WFL  Cognition Overall Cognitive Status: Within Functional Limits for tasks assessed Arousal/Alertness: Awake/alert Orientation Level: Oriented X4 Memory: Appears intact Awareness: Appears intact Safety/Judgment: Appears intact Sensation Sensation Light Touch: Impaired Detail Peripheral sensation comments: decreased sensation in B LE (stocking pattern) with tingling/pain/neuropathy; reports no feeling in R toes Light Touch Impaired Details: Impaired RLE;Impaired LLE Hot/Cold: Not tested Proprioception: Impaired Detail Proprioception Impaired Details: Impaired RLE;Impaired LLE Stereognosis: Not tested Coordination Gross Motor Movements are Fluid and Coordinated: No Coordination and Movement Description: impaired due to significant generalized weakness and decreased strength in B LEs along with B LE neuropathy and significant anxiety with mobility Motor  Motor Motor: Other (comment) Motor - Discharge Observations: impaired due to significant generalized weakness and decreased strength in B LEs along with B LE neuropathy and significant anxiety with mobility  Mobility Bed Mobility Bed Mobility: Supine to Sit;Sit to Supine (relying on hospital bed features (will have this at home)) Supine to Sit: Supervision/Verbal cueing Sit to Supine: Supervision/Verbal cueing Transfers Transfers: Sit to Stand;Stand to Sit;Stand Pivot Transfers Sit to Stand: Contact Guard/Touching assist Stand to Sit: Contact Guard/Touching assist Stand Pivot Transfers: Contact Guard/Touching assist Stand Pivot Transfer Details: Verbal cues for  precautions/safety;Verbal cues for safe use of DME/AE;Verbal cues for technique Transfer (Assistive device): Rolling walker Locomotion  Gait Ambulation: Yes Gait Assistance: Contact Guard/Touching assist Gait Distance (Feet): 35 Feet (gait distance limited primarily due to anxiety during mobility and significant deconditioning) Assistive device: Rolling walker (and R DF assist ACE wrap (pt unable to obtain shoes during CIR stay in order to complete  AFO consultation)) Gait Assistance Details: Verbal cues for precautions/safety;Verbal cues for gait pattern;Verbal cues for sequencing;Verbal cues for safe use of DME/AE (cuing for safe AD management as pt moves it impulsively and quickly due to anxiety) Gait Gait: Yes Gait Pattern: Impaired Gait Pattern: Poor foot clearance - right;Poor foot clearance - left;Step-through pattern;Decreased hip/knee flexion - left;Decreased hip/knee flexion - right;Decreased dorsiflexion - right (frequent pauses in double-limb support) Gait velocity: significantly decreased Stairs / Additional Locomotion Stairs: Yes Stairs Assistance: Minimal Assistance - Patient > 75% Stair Management Technique: Two rails;Forwards;Backwards;Step to pattern (step-to forward, step-back down) Number of Stairs: 1 Height of Stairs: 3 Wheelchair Mobility Wheelchair Mobility: Yes Wheelchair Assistance: Doctor, general practice: Both upper extremities Wheelchair Parts Management: Needs assistance Distance: 4ft  Trunk/Postural Assessment  Cervical Assessment Cervical Assessment: Within Functional Limits Thoracic Assessment Thoracic Assessment: Within Functional Limits Lumbar Assessment Lumbar Assessment: Within Functional Limits Postural Control Postural Control: Deficits on evaluation (requires B UE support on RW)  Balance Balance Balance Assessed: Yes Static Sitting Balance Static Sitting - Balance Support: Feet supported Static Sitting - Level of  Assistance: 6: Modified independent (Device/Increase time) Dynamic Sitting Balance Dynamic Sitting - Balance Support: Feet supported Dynamic Sitting - Level of Assistance: 5: Stand by assistance Static Standing Balance Static Standing - Balance Support: During functional activity;Bilateral upper extremity supported Static Standing - Level of Assistance: 5: Stand by assistance;Other (comment) (CGA) Dynamic Standing Balance Dynamic Standing - Balance Support: During functional activity;Bilateral upper extremity supported Dynamic Standing - Level of Assistance: 4: Min assist;Other (comment) (CGA) Extremity Assessment  ***           Carly M Pippin 06/18/2023, 10:42 AM

## 2023-06-18 NOTE — Progress Notes (Signed)
Physical Therapy Session Note  Patient Details  Name: Phillip Young MRN: [id number] Date of Birth: [birth year]  Today's Date: 06/18/2023 PT Individual Time: 1020-1055 PT Individual Time Calculation (min): 35 min   Short Term Goals: Week 2:  PT Short Term Goal 1 (Week 2): =LTG's  Skilled Therapeutic Interventions/Progress Updates:    Pt received supine in bed awake and agreeable to therapy session. Received and maintaining on supplemental O2 via nasal cannula during session. Pt reports he is felling "good" (smiling) about the plan to D/C home on Monday. Reports he has a lot of family support to ensure he gets in the house safely using w/c to transport from the car to the door. Pt continues to report he feels safe ambulating in the home using bari-RW. Followed up on discussion of getting him shoes due to his not being delivered in time for AFO consult yesterday, assisted with pt looking online and his size in the shoes of his preference are not available in [place] nor [employer] stores to be picked up by therapist - pt had to order them to pick-up in [place] and said his [name] will pick them up for him. Pt will require follow-up AFO consult after D/C once having shoes that he can wear to address R foot drop. Pt slow to initiate OOB mobility today, but with increased time and encouragement able to transition to sitting L EOB, HOB partially elevated and using bedrail, mod-I. Pt reports discomfort when dragging heels on bed sheets due to neuropathy. Pt politely declining transition OOB at this time. Pt reports no additional questions/concerns regarding D/C and reports he has been very pleased with his care during his CIR stay. Pt left seated EOB with needs in reach, lines intact, and NT aware of his position.  Therapy Documentation Precautions:  Precautions Precautions: Fall Precaution Comments: R sided weakness, R foot drop, O2 with activity, watch HR (has a-fib), Stage 3 wound on buttock Required  Braces or Orthoses: Other Brace Other Brace: R foot-up brace, or CAM boot Restrictions Weight Bearing Restrictions: No   Pain: Reports pain 8/10 neuropathic pain in B LEs - premedicated - provided distraction, emotional support, and redirection for pain management.   Therapy/Group: Individual Therapy  Ginny Forth , PT, DPT, NCS, CSRS 06/18/2023, 7:57 AM

## 2023-06-18 NOTE — Progress Notes (Signed)
   06/18/23 1800  Vitals  Temp 98.2 F (36.8 C)  Temp Source Oral  BP 103/60  BP Location Right Arm  BP Method Automatic  Patient Position (if appropriate) Lying  Pulse Rate 86  Pulse Rate Source Monitor  Oxygen Therapy  SpO2 100 %  O2 Device Nasal Cannula  O2 Flow Rate (L/min) 4 L/min  During Treatment Monitoring  HD Safety Checks Performed Yes  Intra-Hemodialysis Comments Tx completed;Tolerated well  Dialysis Fluid Bolus Normal Saline  Bolus Amount (mL) 300 mL

## 2023-06-18 NOTE — Procedures (Signed)
1. Acute hypoxic respiratory failure: Remains on supplemental oxygen and ongoing efforts at continued ultrafiltration/lowering EDW with dialysis (anticipating that he has sustained weight loss during this hospitalization). He is stable at baseline O2 requirement now.  2. MSSA Bacteremia: TDC replaced after he underwent line holiday 05/26/23-05/30/23.  Continue cefazolin IV per pmd.  2. ESRD: on TTS schedule here this week, HD today. Then we are switching back to MWF. But for this holiday week, his days will be [facility] -Tues- Friday starting tomorrow.  3. HTN/volume: He remains on midodrine 30mg  TID 4. Anemia: Hgb 10.1 at goal, currently on  ESA 150 weekly --> we decreased to 60 with Hgb over 10 5. Secondary hyperparathyroidism: Calcium level elevated and VDRA remains on hold, phosphorus 4.7 lanthanum  Phos binder 6. Nutrition:  Continue renal diet and protein supplements 7. H/o HIT: no heparin w/ dialysis, block catheter w/ sodium citrate 8.  Deconditioning right foot drop- rehab  with CIR  I have reviewed the HD regimen and made appropriate changes.  Vinson Moselle MD  [facility] 06/18/2023, 6:46 PM

## 2023-06-18 NOTE — Consult Note (Signed)
Neuropsychological Consultation Comprehensive Inpatient Rehab   Patient:   Phillip Young   DOB:   [birth year]  MR Number:  [id number]  Location:  [facility] [address] CENTER A [address] [place] [address] Dept: [phone] Loc: [phone]           Date of Service:   06/17/2023  Start Time:   9 AM End Time:   10 AM  Provider/Observer:  Arley Phenix, Psy.D.       Clinical Neuropsychologist       Billing Code/Service: [phone]  Reason for Service:    Phillip Young is a 37 year old male referred for neuropsychological consultation during his ongoing admission into the comprehensive inpatient rehabilitation unit.  The patient has a very complicated medical history with history of end-stage renal disease and had recently missed hemodialysis for 1 week because of transportation issues and being unable to get out of his house.  Patient has chronic hypoxic respiratory failure oxygen therapy of 2-5 L and has chronic history of vascular issues.  Patient has been followed by pulmonology, cardiology and now nephrology.  Patient had a recent extended hospitalization and then was discharged to [facility] over the summer.  He had been discharged and then went to skilled nursing rehab and was discharged on 10/18.  Patient had developed significant fever, malaise and hypotension and was worked up for sepsis and started on broad-spectrum antibiotics.  Patient with long hospitalization and now has been transferred over to the comprehensive inpatient rehabilitation unit.  Of 1 note is the patient's experiences including very vivid dreams and experiences during his intubation.  Patient notes that he has been making some gains on the comprehensive inpatient rehabilitation unit.  Patient is severely deconditioned.  Today's visit was a follow-up visit the patient was awake sitting up in his wheelchair and engaged throughout her visit.   The patient has continued to progress with his adjustment and adaptive skills being present.  Today we had a very productive visit with the patient engaged throughout working on strategies going forward post discharge continued efforts with regard to building physical stamina and increasing overall strength.  Patient reports that he continues to have a very positive interaction with unit staff and is feeling comfortable and engaged.  There do tend to be sometimes that he gets very stressed with being in the hospital overall but feels like he is being well taken care of by staff.   HPI for the current admission:    HPI: Phillip Young is a 37 year old male presented to the emergency department 05/18/2023 complaining of leg swelling and pain.  He has a history of end-stage renal disease on chronic intermittent hemodialysis on Mondays, Wednesdays and Fridays.  He stated he had missed hemodialysis treatment for 1 week because of transportation issues and unable to get out of his house.  He has a history of chronic hypoxic respiratory failure chronic oxygen therapy of 2-5 L.  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 Shirlee Latch.  He is maintained on Eliquis.  He was admitted to [name] health internal medicine residency service.  Nephrology consulted for hemodialysis management.  Recently hospitalized over the summer with a prolonged hospital stay and discharged to [facility].  He was then discharged to [facility] rehab on 10/02 and discharged from there on 10/18.  Developed fever to  103 on 10/29 accompanied by hypotension, malaise.  EKG consistent with atrial fibrillation with RVR.  Sepsis workup initiated and started on broad-spectrum antibiotics.  Cardiology consulted.  Transthoracic echocardiogram performed in July 2024 had an estimated EF of 20 to 25%.  Given gentle hydration  and serial lactic acid determination.  Midodrine started 30 mg 3 times daily for blood pressure support.  Cardiology unable to utilize goal-directed medical therapy secondary to hypotension as well as his end-stage renal disease.  He is not a candidate for advanced therapies or ICD.  Tunneled dialysis catheter was removed 10/31 for line holiday.  Blood cultures positive for MSSA.  Echocardiogram updated 10/31 and left ventricular ejection fraction estimated at 30% with global hypokinesis.  ID consultation obtained on 11/01 and plan 6 weeks IV antibiotics. Patient is not a candidate for TEE per primary's discussion with Dr. Anne Fu given morbid obesity, 4 to 5 L oxygen requirement, on 30 mg 3 times daily midodrine, SBP. Pressure injury of left buttock addressed. TDC replaced 11/04.  Follow-up by cardiology 11/06.  The patient remains in atrial fibrillation with reasonably controlled rate on digoxin.  He is not a candidate for amiodarone or AV nodal ablation with PPM.  He has failed attempts at DCCV and will continue Eliquis.  Dr. Duke Salvia discussed with the patient that his heart rate elevation with exertion is due to deconditioning.  Continue digoxin.  Recommend following up within 1 month of discharge and continue to check digoxin levels at least every 3 months. Tolerating, but does not like renal diet.   Medical History:   Past Medical History:  Diagnosis Date   Acute on chronic respiratory failure with hypoxia ([name]) 04/21/2021   Acute on chronic systolic (congestive) heart failure ([name]) 02/26/2020   Amiodarone toxicity    Anemia    Atrial flutter ([name])    Biventricular congestive heart failure ([name])    Chronic hypoxemic respiratory failure ([name])    Class 3 severe obesity due to excess calories with serious comorbidity and body mass index (BMI) of 50.0 to 59.9 in adult ([name]) 02/26/2020   Essential hypertension 02/26/2020   GERD without esophagitis 02/26/2020   Hidradenitis suppurativa 02/26/2020    NICM (nonischemic cardiomyopathy) ([name])    Obesity hypoventilation syndrome ([name])    OSA (obstructive sleep apnea)    PAF (paroxysmal atrial fibrillation) ([name])    Pneumonia    Prediabetes 02/26/2020         Patient Active Problem List   Diagnosis Date Noted   Coping style affecting medical condition 06/18/2023   Adjustment disorder with mixed anxiety and depressed mood 06/10/2023   Metabolic encephalopathy 06/07/2023   Debility 06/03/2023   HFrEF (heart failure with reduced ejection fraction) ([name]) 06/01/2023   Neuropathy 05/28/2023   MSSA bacteremia 05/26/2023   Hyperkalemia 03/21/2023   Central line infection 03/10/2023   Pressure injury of skin 01/23/2023   Acute hypoxic respiratory failure ([name]) 01/21/2023   Atrial fibrillation ([name]) 01/21/2023   Acute on chronic hypoxic respiratory failure ([name]) 08/07/2022   Congestive heart failure ([name]) 08/05/2022   Elevated liver enzymes 06/08/2022   Multiple nodules of lung 05/31/2022   Sepsis ([name])    Advanced care planning/counseling discussion    Recurrent infections 09/30/2021   Reactive depression 09/02/2021   Hypervolemia 08/18/2021   Type 2 diabetes mellitus with diabetic chronic kidney disease ([name]) 06/26/2021   ESRD on dialysis [facility])    Medical non-compliance 04/21/2021   Hypotension 04/21/2021   ESRD (end stage renal disease) on  dialysis ([name]) 04/16/2021   ESRD (end stage renal disease) ([name]) 04/12/2021   Paroxysmal atrial flutter ([name]) 04/12/2021   Cardiogenic shock ([name])    Chronic respiratory failure with hypoxia ([name]) 02/11/2021   Chronic systolic CHF (congestive heart failure) ([name])    Chronic cough    Morbid obesity ([name]) 02/26/2020   GERD without esophagitis 02/26/2020   OSA (obstructive sleep apnea) 02/26/2020   Hidradenitis suppurativa 02/26/2020    Behavioral Observation/Mental Status:   Phillip Young  presents as a 37 y.o.-year-old Right handed African American Male who appeared his stated age. his  dress was Appropriate and he was Well Groomed and his manners were Appropriate to the situation.  his participation was indicative of Appropriate behaviors.  There were physical disabilities noted.  he displayed an appropriate level of cooperation and motivation.    Interactions:    Active Appropriate and Redirectable  Attention:   within normal limits   Memory:   within normal limits; remote memory intact, recent memory impaired around events during his most acute critical illness  Visuo-spatial:   not examined  Speech (Volume):  low  Speech:   normal; slurred  Thought Process:  Coherent and Relevant  Coherent  Though Content:  WNL; not suicidal and not homicidal  Orientation:   person, place, time/date, and situation  Judgment:   Fair  Planning:   Fair  Affect:    Blunted and Flat  Mood:    Euthymic  Insight:   Good  Intelligence:   normal  Psychiatric History:  Patient with past history of reactive depressive events.  Family Med/Psych History:  Family History  Problem Relation Age of Onset   Heart disease Mother    Hypertension Mother    Pulmonary Hypertension Mother    Drug abuse Father        died due to Heroin overdose     Impression/DX:   Phillip Young is a 37 year old male referred for neuropsychological consultation during his ongoing admission into the comprehensive inpatient rehabilitation unit.  The patient has a very complicated medical history with history of end-stage renal disease and had recently missed hemodialysis for 1 week because of transportation issues and being unable to get out of his house.  Patient has chronic hypoxic respiratory failure oxygen therapy of 2-5 L and has chronic history of vascular issues.  Patient has been followed by pulmonology, cardiology and now nephrology.  Patient had a recent extended hospitalization and then was discharged to [facility] over the summer.  He had been discharged and then went to skilled  nursing rehab and was discharged on 10/18.  Patient had developed significant fever, malaise and hypotension and was worked up for sepsis and started on broad-spectrum antibiotics.  Patient with long hospitalization and now has been transferred over to the comprehensive inpatient rehabilitation unit.  Of 1 note is the patient's experiences including very vivid dreams and experiences during his intubation.  Patient notes that he has been making some gains on the comprehensive inpatient rehabilitation unit.  Patient is severely deconditioned.  Today's visit was a follow-up visit the patient was awake sitting up in his wheelchair and engaged throughout her visit.  The patient has continued to progress with his adjustment and adaptive skills being present.  Today we had a very productive visit with the patient engaged throughout working on strategies going forward post discharge continued efforts with regard to building physical stamina and increasing overall strength.  Patient reports that he continues to have a  very positive interaction with unit staff and is feeling comfortable and engaged.  There do tend to be sometimes that he gets very stressed with being in the hospital overall but feels like he is being well taken care of by staff.  Disposition/Plan:  Today we worked on coping and adjustment issues and discussed issues for follow-up outpatient and discussed this with PA.          Electronically Signed   _______________________ Arley Phenix, Psy.D. Clinical Neuropsychologist

## 2023-06-19 LAB — CBC
HCT: 36.3 % — ABNORMAL LOW (ref 39.0–52.0)
Hemoglobin: 11.3 g/dL — ABNORMAL LOW (ref 13.0–17.0)
MCH: 31.3 pg (ref 26.0–34.0)
MCHC: 31.1 g/dL (ref 30.0–36.0)
MCV: 100.6 fL — ABNORMAL HIGH (ref 80.0–100.0)
Platelets: 222 10*3/uL (ref 150–400)
RBC: 3.61 MIL/uL — ABNORMAL LOW (ref 4.22–5.81)
RDW: 15.2 % (ref 11.5–15.5)
WBC: 5.5 10*3/uL (ref 4.0–10.5)
nRBC: 0 % (ref 0.0–0.2)

## 2023-06-19 LAB — RENAL FUNCTION PANEL
Albumin: 3.2 g/dL — ABNORMAL LOW (ref 3.5–5.0)
Anion gap: 17 — ABNORMAL HIGH (ref 5–15)
BUN: 34 mg/dL — ABNORMAL HIGH (ref 6–20)
CO2: 21 mmol/L — ABNORMAL LOW (ref 22–32)
Calcium: 10.2 mg/dL (ref 8.9–10.3)
Chloride: 98 mmol/L (ref 98–111)
Creatinine, Ser: 7.72 mg/dL — ABNORMAL HIGH (ref 0.61–1.24)
GFR, Estimated: 9 mL/min — ABNORMAL LOW (ref >60)
Glucose, Bld: 114 mg/dL — ABNORMAL HIGH (ref 70–99)
Phosphorus: 6.3 mg/dL — ABNORMAL HIGH (ref 2.5–4.6)
Potassium: 5 mmol/L (ref 3.5–5.1)
Sodium: 136 mmol/L (ref 135–145)

## 2023-06-19 NOTE — Progress Notes (Signed)
Occupational Therapy Discharge Summary  Patient Details  Name: Phillip Young MRN: [id number] Date of Birth: [birth year]  Date of Discharge from OT service:June 19, 2023  Today's Date: 06/19/2023 OT Individual Time: 6213-0865 OT Individual Time Calculation (min): 71 min    Patient has met 10 of 10 long term goals due to improved activity tolerance, improved balance, postural control, ability to compensate for deficits, and improved coordination.  Patient to discharge at overall Supervision level.  Patient's care partner is independent to provide the necessary physical assistance at discharge.    Reasons goals not met: All goals met  Recommendation:  Patient will benefit from ongoing skilled OT services in home health setting to continue to advance functional skills in the area of BADL, iADL, and Reduce care partner burden.  Equipment: No equipment provided  Reasons for discharge: treatment goals met and discharge from hospital  Patient/family agrees with progress made and goals achieved: Yes  OT Discharge Skilled Therapeutic Interventions/Progress Updates:  Pt received sitting EOB finishing his breakfast upon OT arrival. Pt presenting to be in good spirits receptive to skilled OT session reporting 7/10 pain in B LEs d/t neuropathy- OT offering intermittent rest breaks, repositioning, and therapeutic support to optimize participation in therapy session. RN in/out at beginning of therapy session providing morning medications and pain medications. MD in/out during session for morning rounding. Focused time during session on completing re-assessment of [name] functional status, vision, coordination, strength, balance, and sensation- see below documentation. Pt donned clean pants sitting EOB with significantly increased amount of time required when weaving B LEs into pants d/t friction from sweat pants causing increased pain in B LEs. Pt stood with CGA and brought pants to waist with single UE  supported on RW. Stand step EOB>wc using RW CGA. Transported Pt total A to therapy gym in [place]c for time management and energy conservation.   Pt completed grip test assessment while sitting in wc to assess changed in UE strength during hospital stay. The Dynamometer Grip Strength Test is a quantitative and objective measure of isometric muscular strength of the hand and forearm. -Instructions The patient was asked to sit with their back, pelvis, and knees at 90 degrees. The shoulder was adducted and neutrally rotated with The elbow flexed to 90 degrees and forearm in neutral. The arm was not supported. -Results The score was determined by calculating the average of 3 trials. The pt's average score was 126.78 lbs in the R hand and 124 lbs in  the L hand.  R hand:  Trial 1: 125 Trial 2: 122 Trial 3: 133 Average: 126.78 lbs  L hand:  Trial 1:123 Trial 2: 119 Trial 3: 130 Average: 124 lbs  Pt completed 9 Hole Peg Test for [facility], speed, and dexterity while sitting up in wc at table top. During initial evaluation, Pt completed assessment in 25 sec on L hand and 27 sec on R hand. Pt with insignificant changes in scores during re-assessment averaging 25.97 seconds on R hand and 26.46 seconds on L hand     Right:   28.18 sec 25.69 sec 24.04 Average: 25.97 sec  Left:   28.36 sec 26.87 24.15 sec Average: 26.46 sec  Transported Pt back to room total A in wc for energy conservation. Stand pivot wc>EOB using RW CGA for balance. Pt was left sitting EOB with call bell in reach, bed alarm on, and all needs met. RN informed of [name] status.   Precautions/Restrictions  Precautions Precautions: Fall;Other (comment) Precaution Comments: R  sided weakness, R foot drop, O2 via nasal cannula at rest and with activity baseline, pressure wound on buttock Required Braces or Orthoses: Other Brace Other Brace: needs support for R foot drop Restrictions Weight Bearing Restrictions: No ADL ADL Equipment  Provided: Long-handled sponge Eating: Set up Where Assessed-Eating: Edge of bed Grooming: Setup Where Assessed-Grooming: Edge of bed Upper Body Bathing: Supervision/safety Where Assessed-Upper Body Bathing: Shower Lower Body Bathing: Supervision/safety Where Assessed-Lower Body Bathing: Shower Upper Body Dressing: Setup Where Assessed-Upper Body Dressing: Edge of bed Lower Body Dressing: Contact guard Where Assessed-Lower Body Dressing: Edge of bed Toileting: Contact guard, Supervision/safety Where Assessed-Toileting: Teacher, adult education: Furniture conservator/restorer Method: Ambulating, Stand pivot Acupuncturist: Engineer, technical sales: Not assessed Geologist, engineering Method: Stand pivot, Designer, industrial/product: Grab bars, Transfer tub bench ADL Comments: increased body habitus for reach to buttocks and LE's thus max A LB, CGA UB, min A x 2 for safety for RW transfers to bari w/c and commode Vision Baseline Vision/History: 0 No visual deficits Patient Visual Report: No change from baseline Vision Assessment?: No apparent visual deficits Perception  Perception: Within Functional Limits Praxis Praxis: WFL Cognition Cognition Overall Cognitive Status: Within Functional Limits for tasks assessed Arousal/Alertness: Awake/alert Orientation Level: Person;Place;Situation Person: Oriented Place: Oriented Memory: Appears intact Awareness: Appears intact Problem Solving: Appears intact Safety/Judgment: Appears intact Brief Interview for Mental Status (BIMS) Repetition of Three Words (First Attempt): 3 Temporal Orientation: Year: Correct Temporal Orientation: Month: Accurate within 5 days Temporal Orientation: Day: Correct Recall: "Sock": Yes, no cue required Recall: "Blue": Yes, no cue required Recall: "Bed": Yes, no cue required BIMS Summary Score: 15 Sensation Sensation Light Touch: Impaired  Detail Peripheral sensation comments: decreased sensation in B LE (stocking pattern) with tingling/pain/neuropathy; reports no feeling in R toes Light Touch Impaired Details: Impaired RLE;Impaired LLE Hot/Cold: Not tested Proprioception: Impaired Detail Proprioception Impaired Details: Impaired RLE;Impaired LLE Stereognosis: Not tested Coordination Gross Motor Movements are Fluid and Coordinated: No Coordination and Movement Description: impaired due to significant generalized weakness and decreased strength in B LEs along with B LE neuropathy and significant anxiety with mobility Finger Nose Finger Test: intact Bly Motor  Motor Motor: Other (comment) Motor - Discharge Observations: impaired due to significant generalized weakness and decreased strength in B LEs along with B LE neuropathy and significant anxiety with mobility Mobility  Bed Mobility Bed Mobility: Supine to Sit;Sit to Supine Rolling Right: Independent with assistive device;Supervision/verbal cueing Rolling Left: Independent with assistive device;Maximal Assistance - Patient 25-49% Left Sidelying to Sit: Supervision/Verbal cueing Supine to Sit: Supervision/Verbal cueing Sit to Supine: Supervision/Verbal cueing Transfers Sit to Stand: Contact Guard/Touching assist Stand to Sit: Contact Guard/Touching assist  Trunk/Postural Assessment  Cervical Assessment Cervical Assessment: Within Functional Limits Thoracic Assessment Thoracic Assessment: Within Functional Limits Lumbar Assessment Lumbar Assessment: Within Functional Limits Postural Control Postural Control: Deficits on evaluation Righting Reactions: delayed due to prolonged debility and bed rest  Balance Balance Balance Assessed: Yes Static Sitting Balance Static Sitting - Balance Support: Feet supported Static Sitting - Level of Assistance: 6: Modified independent (Device/Increase time) Dynamic Sitting Balance Dynamic Sitting - Balance Support: Feet  supported Dynamic Sitting - Level of Assistance: 5: Stand by assistance Static Standing Balance Static Standing - Balance Support: During functional activity;Bilateral upper extremity supported Static Standing - Level of Assistance: 5: Stand by assistance;Other (comment) Dynamic Standing Balance Dynamic Standing - Balance Support: During functional activity;Bilateral upper extremity supported Dynamic Standing - Level  of Assistance: 4: Min assist;Other (comment) (CGA) Extremity/Trunk Assessment RUE Assessment RUE Assessment: Exceptions to [facility] Active Range of Motion (AROM) Comments: 0-45 R sh General Strength Comments: 2-/5 R sh LUE Assessment LUE Assessment: Within Functional Limits   Clide Deutscher 06/19/2023, 9:19 AM

## 2023-06-19 NOTE — Plan of Care (Signed)
  Problem: RH Balance Goal: LTG Patient will maintain dynamic standing balance (PT) Description: LTG:  Patient will maintain dynamic standing balance with assistance during mobility activities (PT) Outcome: Completed/Met   Problem: Sit to Stand Goal: LTG:  Patient will perform sit to stand with assistance level (PT) Description: LTG:  Patient will perform sit to stand with assistance level (PT) Outcome: Completed/Met   Problem: RH Bed Mobility Goal: LTG Patient will perform bed mobility with assist (PT) Description: LTG: Patient will perform bed mobility with assistance, with/without cues (PT). Outcome: Completed/Met   Problem: RH Bed to Chair Transfers Goal: LTG Patient will perform bed/chair transfers w/assist (PT) Description: LTG: Patient will perform bed to chair transfers with assistance (PT). Outcome: Completed/Met   Problem: RH Car Transfers Goal: LTG Patient will perform car transfers with assist (PT) Description: LTG: Patient will perform car transfers with assistance (PT). Outcome: Completed/Met   Problem: RH Furniture Transfers Goal: LTG Patient will perform furniture transfers w/assist (OT/PT) Description: LTG: Patient will perform furniture transfers  with assistance (OT/PT). Outcome: Completed/Met   Problem: RH Ambulation Goal: LTG Patient will ambulate in controlled environment (PT) Description: LTG: Patient will ambulate in a controlled environment, # of feet with assistance (PT). Outcome: Completed/Met Goal: LTG Patient will ambulate in home environment (PT) Description: LTG: Patient will ambulate in home environment, # of feet with assistance (PT). Outcome: Completed/Met

## 2023-06-19 NOTE — Progress Notes (Signed)
PROGRESS NOTE   Subjective/Complaints:  No events overnight.  Vital stable.   2x large Bms yesterday.  Patient continuing to be distressed by bilateral lower extremity neuropathy.  Asked several times what regimens have been better for the patient, generally he appreciates minimal difference with gabapentin and Lyrica, only responses with oxycodone which she does not like taking.  Explained dosing restrictions around dialysis, reluctant to adjust medications further at this time, he is understanding.  Later, during therapies, reported patient is tearful regarding discharge approaching and not feeling prepared.  ROS: Bilateral lower extremity neuropathic pain -severe, ongoing.  Anxiety-ongoing.  Patient denies fever, chills, SOB,, No GI distress      Objective:   No results found. Recent Labs    06/18/23 0513  WBC 6.2  HGB 9.6*  HCT 31.0*  PLT 205     Recent Labs    06/18/23 1420  NA 137  K 5.4*  CL 97*  CO2 25  GLUCOSE 92  BUN 46*  CREATININE 11.24*  CALCIUM 9.4      Intake/Output Summary (Last 24 hours) at 06/19/2023 0930 Last data filed at 06/18/2023 1821 Gross per 24 hour  Intake 0 ml  Output 4000 ml  Net -4000 ml      Pressure Injury 05/18/23 Buttocks Right Stage 3 -  Full thickness tissue loss. Subcutaneous fat may be visible but bone, tendon or muscle are NOT exposed. (Active)  05/18/23 2300  Location: Buttocks  Location Orientation: Right  Staging: Stage 3 -  Full thickness tissue loss. Subcutaneous fat may be visible but bone, tendon or muscle are NOT exposed.  Wound Description (Comments):   Present on Admission: Yes (original date 05/18/23; present on admit to CIR 06/03/23)     Pressure Injury 05/18/23 Buttocks Left Stage 2 -  Partial thickness loss of dermis presenting as a shallow open injury with a red, pink wound bed without slough. (Active)  05/18/23 2300  Location: Buttocks  Location  Orientation: Left  Staging: Stage 2 -  Partial thickness loss of dermis presenting as a shallow open injury with a red, pink wound bed without slough.  Wound Description (Comments):   Present on Admission: Yes (original date 05/18/23; present on admit to CIR 06/03/23)    Physical Exam: Vital Signs Blood pressure 116/78, pulse 78, temperature 98.3 F (36.8 C), resp. rate 17, height 6' (1.829 m), weight (!) 158 kg, SpO2 100%.   General: No acute distress, laying in bed.  Morbidly obese.  Working in gym with OT. Mood and affect are appropriate; intermittently tearful-ongoing Heart: Regular rate and rhythm no rubs murmurs or extra sounds Lungs: Clear to auscultation, breathing unlabored, no rales or wheezes.  On 2 L nasal cannula. Abdomen: Positive bowel sounds, soft nontender to palpation, nondistended Extremities: 1+ edema B/L LE-unchanged Neurologic: Alert and awake, cranial nerves II through XII grossly intact Altered sensation bilateral feet, with extreme hypersensitivity Moving bilateral upper extremities antigravity against resistance; right lower extremity absent dorsiflexion plantarflexion, left lower extremity 4 out of 5 throughout limited by pain with movement -unchanged Plantar flexor tone in right lower extremity, MAS 2-not examined in wheelchair today  Prior exam L/R buttock wounds dressed  Neurologic: Cranial nerves II through XII intact, motor strength is 5/5 in bilateral deltoid, bicep, tricep, grip, 0/5 right  ankle dorsiflexor and plantar flexor 5/5 RIght quad 4/5 R hamstring, 5/5 left quad and hamstring and ankle  Sensory exam absent LT sensation RIght foot/ankle , no proprio Right toes , partial proprio at R ankle ,   Left foot intact sensation light touch is diminished over the left great toe, proprioception is diminished in the left great toe as well he does have intact proprioception at the ankle.  Musculoskeletal: No evidence of MTP swelling on the left side no  swelling at the ankle joint.  There is evidence of peripheral edema 1+ both feet Is tight at end range with abd and ext rotation and arm flexion  Assessment/Plan: 1. Functional deficits which require 3+ hours per day of interdisciplinary therapy in a comprehensive inpatient rehab setting. Physiatrist is providing close team supervision and 24 hour management of active medical problems listed below. Physiatrist and rehab team continue to assess barriers to discharge/monitor patient progress toward functional and medical goals  Care Tool:  Bathing    Body parts bathed by patient: Right arm, Left arm, Chest, Abdomen, Front perineal area, Face, Buttocks, Left upper leg, Right upper leg, Right lower leg, Left lower leg   Body parts bathed by helper: Left lower leg, Right lower leg, Left upper leg, Buttocks, Right upper leg     Bathing assist Assist Level: Supervision/Verbal cueing     Upper Body Dressing/Undressing Upper body dressing   What is the patient wearing?: Pull over shirt    Upper body assist Assist Level: Set up assist    Lower Body Dressing/Undressing Lower body dressing      What is the patient wearing?: Pants, Underwear/pull up     Lower body assist Assist for lower body dressing: Contact Guard/Touching assist     Toileting Toileting    Toileting assist Assist for toileting: Contact Guard/Touching assist     Transfers Chair/bed transfer  Transfers assist     Chair/bed transfer assist level: Contact Guard/Touching assist     Locomotion Ambulation   Ambulation assist      Assist level: Minimal Assistance - Patient > 75% Assistive device: Walker-rolling Max distance: 8 ft   Walk 10 feet activity   Assist  Walk 10 feet activity did not occur: Safety/medical concerns        Walk 50 feet activity   Assist Walk 50 feet with 2 turns activity did not occur: Safety/medical concerns         Walk 150 feet activity   Assist Walk 150 feet  activity did not occur: Safety/medical concerns         Walk 10 feet on uneven surface  activity   Assist Walk 10 feet on uneven surfaces activity did not occur: Safety/medical concerns         Wheelchair     Assist Is the patient using a wheelchair?: Yes Type of Wheelchair: Manual    Wheelchair assist level: Dependent - Patient 0% Max wheelchair distance: 400 ft    Wheelchair 50 feet with 2 turns activity    Assist        Assist Level: Dependent - Patient 0%   Wheelchair 150 feet activity     Assist      Assist Level: Dependent - Patient 0%   Blood pressure 116/78, pulse 78, temperature 98.3 F (36.8 C), resp. rate 17, height 6' (1.829 m), weight (!) 158 kg,  SpO2 100%.  Medical Problem List and Plan: 1. Functional deficits secondary to debility from MSSA bacteremia, c/b multiple medical comorbidities             -patient may shower with buttocks wound and lines covered             -ELOS/Goals: 11/26 , supervision PT/OT,   -Continue CIR therapies including PT, OT  - Spoke with patient as well as physical therapy today.  Patient is willing to stay longer if additional goals can be achieved.  PT states that the patient made some great strides yesterday.  Will continue to monitor therapy progress to determine whether additional days are needed to upgrade goals.  OT input  would be helpful as well  11-24: Patient endorsing significant anxiety regarding discharge.  Would Address with primary team 2.  Antithrombotics: -DVT/anticoagulation:  Pharmaceutical: Eliquis 5 mg BID             -antiplatelet therapy: none   3. Pain Management: Tylenol as needed             -PO Dilaudid initiated on acute side due to nausea with oxycodone--> On admission patient requests to switch back, changed back to oxycodone 15 mg Q4H PRN and Dced Dilaudid, Increasing Oxy use to TID hope to wean this if patient can get good relief with pregabalin              -   lidocaine  cream, discussed pregabalin which pt declines at this time   -have discussed etiology of numbness in legs.  -DC gabapentin and start pregabalin 50 mg will give an extra 50 mg after dialysis as well -Monitor response to medication change to Lyrica 11-23: No appreciable change with Lyrica, was only started Thursday so we will give the weekend to monitor for response 11-24: Patient unsure if Lyrica versus gabapentin more effective, only appreciable effects are from oxycodone.  Declined further dose adjusted, primary team can address.              4. Mood/Behavior/Sleep: LCSW to evaluate and provide emotional support             -continue melatonin 5 mg q HS             -antipsychotic agents: n/a          -11/11 Start alprazolam 0.25mg  PRN BID for anxiety - Neuropsych following pt, evaluated 11-22  5. Neuropsych/cognition: This patient is capable of making decisions on his own behalf.   6. Skin/Wound Care: Routine skin care checks             -pressure injury left buttock/posterior thigh>>continue wound care              - Offered LAL mattress given HX pressure wounds; patient declines- RIght buttocks wound has been healing well with standard mattress   7. Fluids/Electrolytes/Nutrition: Routine Is and Os and follow-up chemistries -Renal diet with 1200 cc fluid restriction             -Continue Fosrenol 2000 mg 3 times daily with meals   8: Hypotension: monitor TID and prn             -continue midodrine 30 mg 3 times daily with meals Encourage patient to discuss dosing with nephrology. No current orthostatic drops on current dose   -BP stable, continue current regimen 11-23/24: BP stable   Vitals:   06/18/23 2028 06/19/23 0515  BP: 93/65 116/78  Pulse: 97 78  Resp: 18 17  Temp: 98.7 F (37.1 C) 98.3 F (36.8 C)  SpO2: 99% 100%      9: pAF: on Eliquis              -unable to tolerate amiodarone (requires midodrine for BP so not a candidate for nodal agents)On M-W-F digoxin     10: Hypothyroidism: Continue Synthroid 25 mcg daily   11: Chronic systolic heart failure: EF ~30-35%             -continue digoxin 0.125 mg  3 x weekly             -daily weight             11/16 -volume mgt>>HD 3 x weekly--pt refused Friday due to timing.  Now on Tu Th Sa   11-23/24: Weights remain stable.  Oxygen utilization stable at 2 L.  Monitor   Filed Weights   06/18/23 1418 06/18/23 1822 06/19/23 0500  Weight: (!) 163 kg (!) 159 kg (!) 158 kg      12: Chronic hypoxic respiratory failure: on home O2 5 L via [place]   13: LE neuropathy/ R foot drop: CAM boot              - Pattern of weakness and sensory loss consistent with sciatic neuropathy above the knee (mild weakness  hamstrings); likely compressive from ICU stay  - hopeful for improvement and likely will need EMG as OP -11/11 Continue TED hoses patient reports this is helping his neuropathic pain.  Gabapentin dose limited by renal function pt feels dosing after HD is more helpful .  Could consider trying Lyrica instead of gabapentin if pain worsens  Not a diabetic, possible uremic neuropathy given some missed HD treatments Right LE symptoms sensory and motor associated with deep decubitus over the right buttocks Pt may have had compressive neuropathy of Left sciatic nerve , just not as severe  11-23: Enforced use of PRAFO to prevent right lower extremity plantarflexion tone 11-24: No appreciable difference between Lyrica and gabapentin for patient; no preference   14: Morbid Obesity: Body mass index is 47.24 kg/m.    15: Anemia: on ESP   16: History or HIT positivity>>no heparin   17: MSSA bacteremia: will continue Ancef 2 g with HD treatment 3x/week (through 12/09)   18. Vocal hoarseness s/p extubation. Consider ENT evaluation if no improvement.   11/16-discussed etiology and potential plan with pt today--likely outpt ENT referral if still symptomatic at that point  11-24: Ongoing, agree with outpatient ENT  referral  19.  ESRD on HD- nephro to manage   -Reviewed nephrology note  20. Insomnia  -  melatonin 5mg  somewhat helpful  -Sleep mostly limited by bilateral lower extremity hypersensitivity  21.  Right shoulder pain exam consistent with Right shoulder impingement syndrome vs tear-  xray shows mild R AC jt OA.  MRI shows intact rotator cuff, + adhesive capsulitis  22.  Left foot and ankle pain.  Mainly left great toe MTP does not look inflamed pain is mainly with weightbearing.  Suspect that this is mainly due to neuropathic pain but will check x-rays as well.  He has a history of morbid obesity and could have  Degenerative changes despite his age 48-23/24: No complaints over this weekend   16 days A FACE TO FACE EVALUATION WAS PERFORMED  Angelina Sheriff 06/19/2023, 9:30 AM

## 2023-06-19 NOTE — Progress Notes (Signed)
  [facility] Progress Note   Subjective: no c/o today.   Objective Vitals:   06/18/23 2028 06/19/23 0500 06/19/23 0515 06/19/23 1528  BP: 93/65  116/78 96/63  Pulse: 97  78 84  Resp: 18  17 16   Temp: 98.7 F (37.1 C)  98.3 F (36.8 C) 97.8 F (36.6 C)  TempSrc:    Oral  SpO2: 99%  100% 99%  Weight:  (!) 158 kg    Height:       Physical Exam: General: Alert obese male NAD Heart: RRR no MRG Lungs: CTAB  Abdomen: Obese NABS soft NT ND Extremities: BLE edema Dialysis Access: TDC intact    OP  dialysis Orders: MWF at [facility] 4.5h   400/800    172kg   2K/2Ca bath   TDC   No heparin (+ HIT)   Assessment/Plan: 1. Acute hypoxic respiratory failure: Remains on supplemental oxygen and ongoing efforts at continued ultrafiltration/lowering EDW with dialysis (anticipating that he has sustained weight loss during this hospitalization). He is stable at baseline O2 requirement now.  2. MSSA Bacteremia: TDC replaced after he underwent line holiday 05/26/23-05/30/23.  Continue cefazolin IV per pmd.  2. ESRD: this week will change back to MWF (supposed to be dc'd home Monday). Orders in for HD today on holiday schedule. If dc'd home tomorrow from rehab, next outpt HD would be Tuesday per the holiday schedule.  3. HTN/volume: He remains on midodrine 30mg  TID 4. Anemia: Hgb 10.1 at goal, currently on  ESA 150 weekly --> we decreased to 60 with Hgb over 10 5. Secondary hyperparathyroidism: Calcium level elevated and VDRA remains on hold, phosphorus 4.7 lanthanum  Phos binder 6. Nutrition:  Continue renal diet and protein supplements 7. H/o HIT: no heparin w/ dialysis, block catheter w/ sodium citrate 8.  Deconditioning right foot drop- rehab  with CIR  Disposition: Planning to DC possibly 06/20/2023. Will be on MWF schedule at OP Clinic  Vinson Moselle  MD  [facility] 06/19/2023, 7:07 PM  Recent Labs  Lab 06/18/23 0513 06/18/23 1420 06/19/23 1045  HGB 9.6*  --  11.3*  ALBUMIN  --   2.8* 3.2*  CALCIUM  --  9.4 10.2  PHOS  --  7.0* 6.3*  CREATININE  --  11.24* 7.72*  K  --  5.4* 5.0

## 2023-06-19 NOTE — Plan of Care (Signed)
  Problem: RH Balance Goal: LTG: Patient will maintain dynamic sitting balance (OT) Description: LTG:  Patient will maintain dynamic sitting balance with assistance during activities of daily living (OT) Outcome: Completed/Met Goal: LTG Patient will maintain dynamic standing with ADLs (OT) Description: LTG:  Patient will maintain dynamic standing balance with assist during activities of daily living (OT)  Outcome: Completed/Met   Problem: Sit to Stand Goal: LTG:  Patient will perform sit to stand in prep for activites of daily living with assistance level (OT) Description: LTG:  Patient will perform sit to stand in prep for activites of daily living with assistance level (OT) Outcome: Completed/Met   Problem: RH Grooming Goal: LTG Patient will perform grooming w/assist,cues/equip (OT) Description: LTG: Patient will perform grooming with assist, with/without cues using equipment (OT) Outcome: Completed/Met   Problem: RH Bathing Goal: LTG Patient will bathe all body parts with assist levels (OT) Description: LTG: Patient will bathe all body parts with assist levels (OT) Outcome: Completed/Met   Problem: RH Dressing Goal: LTG Patient will perform upper body dressing (OT) Description: LTG Patient will perform upper body dressing with assist, with/without cues (OT). Outcome: Completed/Met Goal: LTG Patient will perform lower body dressing w/assist (OT) Description: LTG: Patient will perform lower body dressing with assist, with/without cues in positioning using equipment (OT) Outcome: Completed/Met   Problem: RH Toileting Goal: LTG Patient will perform toileting task (3/3 steps) with assistance level (OT) Description: LTG: Patient will perform toileting task (3/3 steps) with assistance level (OT)  Outcome: Completed/Met   Problem: RH Toilet Transfers Goal: LTG Patient will perform toilet transfers w/assist (OT) Description: LTG: Patient will perform toilet transfers with assist,  with/without cues using equipment (OT) Outcome: Completed/Met   Problem: RH Tub/Shower Transfers Goal: LTG Patient will perform tub/shower transfers w/assist (OT) Description: LTG: Patient will perform tub/shower transfers with assist, with/without cues using equipment (OT) Outcome: Completed/Met   

## 2023-06-19 NOTE — Progress Notes (Signed)
PT REFUSED HD AT THIS TIME

## 2023-06-19 NOTE — Progress Notes (Signed)
Inpatient Rehabilitation Discharge Medication Review by a Pharmacist  A complete drug regimen review was completed for this patient to identify any potential clinically significant medication issues.  High Risk Drug Classes Is patient taking? Indication by Medication  Antipsychotic No   Anticoagulant Yes Eliquis - atrial fibrillation   Antibiotic Yes Ancef - MSSA bacteremia- informed renal navigator of plan and patient discharge   Opioid Yes Oxycodone PRN- severe pain   Antiplatelet No   Hypoglycemics/insulin No   Vasoactive Medication Yes Midodrine - orthostatic hypotension Digoxin- HF   Chemotherapy No   Other Yes pregabalin - neuropathic pain Fosrenol, Vit D  - CKD-MBD  Synthroid - hypothyroidism Melatonin - sleep Aranesp- anemia  APAP PRN- pain Xanax PRN- anxiety   Aranesp- CKD-anemia      Type of Medication Issue Identified Description of Issue Recommendation(s)  Drug Interaction(s) (clinically significant)     Duplicate Therapy     Allergy     No Medication Administration End Date     Incorrect Dose     Additional Drug Therapy Needed     Significant med changes from prior encounter (inform family/care partners about these prior to discharge). Gabapentin>>pregabalin  Patient has been informed of medication change   Other       Clinically significant medication issues were identified that warrant physician communication and completion of prescribed/recommended actions by midnight of the next day:  No  Name of provider notified for urgent issues identified:   Provider Method of Notification:   Pharmacist comments:   Time spent performing this drug regimen review (minutes):  20 minutes  Jani Gravel, PharmD Clinical Pharmacist  06/19/2023 10:52 AM

## 2023-06-19 NOTE — Progress Notes (Signed)
Physical Therapy Session Note  Patient Details  Name: Phillip Young MRN: [id number] Date of Birth: November 13, 1985  Today's Date: 06/19/2023 PT Individual Time: 1300-1410 PT Individual Time Calculation (min): 70 min   Short Term Goals: Week 2:  PT Short Term Goal 1 (Week 2): =LTG's  Skilled Therapeutic Interventions/Progress Updates:      Pt sitting EOB on arrival - finishing up his breakfast. Pt hesitatnt to complete therapy session but willing. Pt reports confidence re: DC plan and states he has plenty of family assistance and all equipment is setup for him.   Donned croc's with totalA as he sat EOB, pt highly sensitive to touch.   Sit<>stand from elevated EOB to RW with supervision. Completes stand pivot transfer with supervision and RW into w/c.   Transported at w/c level to main rehab gym and on 2L portable O2 during session.   Sit<>Stand to RW from w/c with supervision. He ambulates 23ft with supervision and RW - cues for increasing stride length and keeping body within walker frame.   Pt completed car transfer with car height simulating a large SUV - supervision for completing the car transfer but needing ++ time. Pt able to lift BLE in and out of the vehicle without external assist.   Wheelchair mobility completed 83ft with supervision using BUE to propel. Distance limited by fatigue.   Pt requesting to stop at the vending machines for a snack - able to use [employer] pay to pay for his snacks. Encouraged healthy options.   Returned to his room and completed stand step transfer with RW at supervision level. Reconnected to wall O2 at 2L. Left with all needs met as he sat EOB, pillow supporting feet due to neuropathy.    Therapy Documentation Precautions:  Precautions Precautions: Fall, Other (comment) Precaution Comments: R sided weakness, R foot drop, O2 via nasal cannula at rest and with activity baseline, pressure wound on buttock Required Braces or Orthoses: Other Brace Other  Brace: needs support for R foot drop Restrictions Weight Bearing Restrictions: No General:      Therapy/Group: Individual Therapy  Orrin Brigham 06/19/2023, 7:43 AM

## 2023-06-20 ENCOUNTER — Other Ambulatory Visit (HOSPITAL_COMMUNITY): Payer: Self-pay

## 2023-06-20 ENCOUNTER — Telehealth (HOSPITAL_COMMUNITY): Payer: Self-pay | Admitting: Pharmacy Technician

## 2023-06-20 MED ORDER — OXYCODONE HCL 5 MG PO TABS
5.0000 mg | ORAL_TABLET | ORAL | 0 refills | Status: DC | PRN
  Filled 2023-06-20: qty 20, 5d supply, fill #0

## 2023-06-20 MED ORDER — LANTHANUM CARBONATE 1000 MG PO CHEW: 2000.0000 mg | CHEWABLE_TABLET | Freq: Three times a day (TID) | ORAL | 0 refills | Status: DC

## 2023-06-20 MED ORDER — LANTHANUM CARBONATE 1000 MG PO CHEW
2000.0000 mg | CHEWABLE_TABLET | Freq: Three times a day (TID) | ORAL | 0 refills | Status: DC
  Filled 2023-06-20: qty 180, 30d supply, fill #0

## 2023-06-20 MED ORDER — OXYCODONE HCL 5 MG PO TABS
20.0000 mg | ORAL_TABLET | ORAL | 0 refills | Status: DC | PRN
  Filled 2023-06-20: qty 20, 5d supply, fill #0

## 2023-06-20 MED ORDER — DIGOXIN 125 MCG PO TABS
0.1250 mg | ORAL_TABLET | ORAL | 0 refills | Status: DC
  Filled 2023-06-20: qty 12, 28d supply, fill #0

## 2023-06-20 MED ORDER — ONDANSETRON HCL 4 MG PO TABS
4.0000 mg | ORAL_TABLET | Freq: Every day | ORAL | 0 refills | Status: DC | PRN
  Filled 2023-06-20: qty 30, 30d supply, fill #0

## 2023-06-20 MED ORDER — LEVOTHYROXINE SODIUM 25 MCG PO TABS
25.0000 ug | ORAL_TABLET | Freq: Every day | ORAL | 0 refills | Status: DC
  Filled 2023-06-20: qty 30, 30d supply, fill #0

## 2023-06-20 MED ORDER — ALPRAZOLAM 0.25 MG PO TABS
0.2500 mg | ORAL_TABLET | Freq: Two times a day (BID) | ORAL | 0 refills | Status: DC | PRN
  Filled 2023-06-20: qty 15, 8d supply, fill #0

## 2023-06-20 MED ORDER — CHOLECALCIFEROL 10 MCG (400 UNIT) PO TABS
400.0000 [IU] | ORAL_TABLET | Freq: Every day | ORAL | 0 refills | Status: DC
  Filled 2023-06-20: qty 100, 100d supply, fill #0

## 2023-06-20 MED ORDER — MIDODRINE HCL 10 MG PO TABS
30.0000 mg | ORAL_TABLET | Freq: Three times a day (TID) | ORAL | 0 refills | Status: DC
  Filled 2023-06-20: qty 90, 30d supply, fill #0

## 2023-06-20 MED ORDER — PREGABALIN 50 MG PO CAPS
ORAL_CAPSULE | ORAL | 0 refills | Status: DC
  Filled 2023-06-20: qty 42, 90d supply, fill #0

## 2023-06-20 MED ORDER — OXYCODONE HCL 15 MG PO TABS
15.0000 mg | ORAL_TABLET | Freq: Four times a day (QID) | ORAL | 0 refills | Status: DC | PRN
  Filled 2023-06-20: qty 20, 5d supply, fill #0

## 2023-06-20 MED ORDER — CEFAZOLIN SODIUM-DEXTROSE 2-4 GM/100ML-% IV SOLN: 2.0000 g | INTRAVENOUS | Status: DC

## 2023-06-20 MED ORDER — CEFAZOLIN SODIUM-DEXTROSE 2-4 GM/100ML-% IV SOLN
2.0000 g | Freq: Once | INTRAVENOUS | Status: AC
  Administered 2023-06-20: 2 g via INTRAVENOUS

## 2023-06-20 MED ORDER — ACETAMINOPHEN 325 MG PO TABS: 325.0000 mg | ORAL_TABLET | ORAL | Status: DC | PRN

## 2023-06-20 MED ORDER — CEFAZOLIN SODIUM-DEXTROSE 2-4 GM/100ML-% IV SOLN: 2.0000 g | Freq: Every day | INTRAVENOUS | Status: DC

## 2023-06-20 NOTE — Progress Notes (Signed)
Contacted [facility] to confirm pt's d/c for today and that pt should resume tomorrow due to holiday schedule. Clinic also advised pt to need iv cefazolin with HD and renal NP to send orders. Clinic staff also advised of new insurance listed on account- [place] [employer].   Olivia Canter Renal Navigator [phone]

## 2023-06-20 NOTE — Discharge Planning (Signed)
[facility] Patient Discharge Orders- [facility] CLINIC: [facility]  Patient's name: Ewel Ikner Admit/DC Dates: 06/03/2023 - 06/20/2023 Rehab 05/18/2023-06/03/2023 [facility]  Discharge Diagnoses: Acute hypoxic respiratory failure MSSA Bacteremia  Aranesp: Given: Yes   Date and amount of last dose: 60 mcg  06/18/2023 Last Hgb: 11.3 PRBC's Given: None Date/# of units: NA ESA dose for discharge: mircera 0 mcg IV q 2 weeks-please dose per protocol IV Iron dose at discharge: Per protocol   Heparin change: No Heparin  EDW Change: Yes New EDW: 155.5 kg  Bath Change: No  Use 2.0 K/2.0 Ca Bath   Access intervention/Change: New TDC placed 05/30/1023 Dr. Lowella Dandy Details: See attached note  Hectorol/Calcitriol change: Not given. Last PTH 30 05/27/2023 High corrected Calcium.   Discharge Labs: Calcium 10.2 Phosphorus 6.3 Albumin 3.2 K+ 5.0  IV Antibiotics: Yes MSSA Bacteremia Ancef 2 grams IV until 07/04/2023 Details:  On Coumadin?: NA Last INR: Next INR: Managed By:   OTHER/APPTS/LAB ORDERS: Monthly labs on admission    D/C Meds to be reconciled by nurse after every discharge.  Completed By: Alonna Buckler [facility] [phone]    Reviewed by: MD:______ RN_______

## 2023-06-20 NOTE — Progress Notes (Signed)
Discharged to home. Accompanied by Uncle. Discharge instructions given by PA. No questions noted. Patient stated that he feels comfortable with wound dressing changes. I also, verbalized the instruction with his mother on the phone. The all verbalized understanding. Linus Galas

## 2023-06-20 NOTE — Progress Notes (Signed)
Patient declined CHG bath this morning. Patient reported was done prior to dialysis. Education reinforced. HD catheter dressing clean dry and intact.

## 2023-06-20 NOTE — Progress Notes (Signed)
Inpatient Rehabilitation Care Coordinator Discharge Note   Patient Details  Name: Phillip Young MRN: [id number] Date of Birth: 07-Jan-1986   Discharge location: Home with mother  Length of Stay: 17 Days  Discharge activity level: Sup/Min  Home/community participation: Mother and Sister  Patient response WU:[id number] Literacy - How often do you need to have someone help you when you read instructions, pamphlets, or other written material from your doctor or pharmacy?: Sometimes  Patient response YN:WGNFAO Isolation - How often do you feel lonely or isolated from those around you?: Rarely  Services provided included: SW, Neuropsych, Pharmacy, RN, CM, TR, OT, PT, MD, RD, SLP  Financial Services:  Financial Services Utilized: Medicaid    Choices offered to/list presented to: Patient  Follow-up services arranged:  Home Health, DME Home Health Agency: [facility]    DME : Patient has all DME    Patient response to transportation need: Is the patient able to respond to transportation needs?: Yes In the past 12 months, has lack of transportation kept you from medical appointments or from getting medications?: No In the past 12 months, has lack of transportation kept you from meetings, work, or from getting things needed for daily living?: No   Patient/Family verbalized understanding of follow-up arrangements:  Yes  Individual responsible for coordination of the follow-up plan: self or mother, [name]  Confirmed correct DME delivered: Andria Rhein 06/20/2023    Comments (or additional information):  Summary of Stay    Date/Time Discharge Planning CSW  06/14/23 1553 D/C moved to 11/25 due to HD. CJB  06/07/23 1405 Discharging home with mother who is only able to provide supervision. Mother stays in basement level apartment ment requiring patient to walk down steps or navigate around the building to aviod steps.  HD patient. Barrier: insurance and caregiver CJB        Andria Rhein

## 2023-06-20 NOTE — Progress Notes (Signed)
Received patient in bed to unit.  Alert and oriented.  Informed consent signed and in chart.   TX duration: 3:15  Patient tolerated well.  Transported back to the room  Alert, without acute distress.  Hand-off given to patient's nurse.   Access used: RIJ TDC Access issues: None  Total UF removed: 4000 mL Medication(s) given: None Post HD VS: please see data insert    06/20/23 0029  Vitals  Temp 98.1 F (36.7 C)  Temp Source Oral  BP 108/66  MAP (mmHg) 79  BP Location Left Arm  BP Method Automatic  Patient Position (if appropriate) Lying  Pulse Rate (!) 43  Pulse Rate Source Monitor  ECG Heart Rate (!) 104  Resp (!) 23  Oxygen Therapy  SpO2 99 %  O2 Device Nasal Cannula  O2 Flow Rate (L/min) 3 L/min  Patient Activity (if Appropriate) In bed  Pulse Oximetry Type Continuous  During Treatment Monitoring  Blood Flow Rate (mL/min) 0 mL/min  Arterial Pressure (mmHg) -0.4 mmHg  Venous Pressure (mmHg) -1.61 mmHg  TMP (mmHg) 13.53 mmHg  Ultrafiltration Rate (mL/min) 1341 mL/min  Dialysate Flow Rate (mL/min) 300 ml/min  Duration of HD Treatment -hour(s) 3.25 hour(s)  Cumulative Fluid Removed (mL) per Treatment  4000.25  Post Treatment  Dialyzer Clearance Lightly streaked  Hemodialysis Intake (mL) 0 mL  Liters Processed 78  Fluid Removed (mL) 4000 mL  Tolerated HD Treatment Yes  Post-Hemodialysis Comments Treatment completed and blood returned without issue.  Note  Patient Observations Patient alert, no c/o voiced, no acute distress noted; patient condition stable for return transport.  Hemodialysis Catheter Right Internal jugular Double lumen Permanent (Tunneled)  Placement Date/Time: 05/30/23 0856   Serial / Lot #: [id number]  Expiration Date: 09/23/27  Time Out: Correct patient;Correct site;Correct procedure  Maximum sterile barrier precautions: Hand hygiene;Cap;Mask;Sterile gown;Sterile gloves;Large sterile s...  Site Condition No complications  Blue Lumen Status  Flushed;Heparin locked;Dead end cap in place  Red Lumen Status Flushed;Heparin locked;Dead end cap in place  Purple Lumen Status N/A  Catheter fill solution 4% Sodium Citrate  Catheter fill volume (Arterial) 1.9 cc  Catheter fill volume (Venous) 1.9  Dressing Type Transparent  Dressing Status Antimicrobial disc in place;Clean, Dry, Intact  Drainage Description None  Post treatment catheter status Capped and Clamped      [facility]

## 2023-06-20 NOTE — Progress Notes (Signed)
PROGRESS NOTE   Subjective/Complaints:  Pt feels ok today , looking forward to d/c .  Very thankful for rehab team efforts   ROS: .  Patient denies fever, chills, SOB,, No GI distress      Objective:   No results found. Recent Labs    06/18/23 0513 06/19/23 1045  WBC 6.2 5.5  HGB 9.6* 11.3*  HCT 31.0* 36.3*  PLT 205 222     Recent Labs    06/18/23 1420 06/19/23 1045  NA 137 136  K 5.4* 5.0  CL 97* 98  CO2 25 21*  GLUCOSE 92 114*  BUN 46* 34*  CREATININE 11.24* 7.72*  CALCIUM 9.4 10.2      Intake/Output Summary (Last 24 hours) at 06/20/2023 1047 Last data filed at 06/20/2023 0029 Gross per 24 hour  Intake --  Output 4000 ml  Net -4000 ml      Pressure Injury 05/18/23 Buttocks Right Stage 3 -  Full thickness tissue loss. Subcutaneous fat may be visible but bone, tendon or muscle are NOT exposed. (Active)  05/18/23 2300  Location: Buttocks  Location Orientation: Right  Staging: Stage 3 -  Full thickness tissue loss. Subcutaneous fat may be visible but bone, tendon or muscle are NOT exposed.  Wound Description (Comments):   Present on Admission: Yes (original date 05/18/23; present on admit to CIR 06/03/23)     Pressure Injury 05/18/23 Buttocks Left Stage 2 -  Partial thickness loss of dermis presenting as a shallow open injury with a red, pink wound bed without slough. (Active)  05/18/23 2300  Location: Buttocks  Location Orientation: Left  Staging: Stage 2 -  Partial thickness loss of dermis presenting as a shallow open injury with a red, pink wound bed without slough.  Wound Description (Comments):   Present on Admission: Yes (original date 05/18/23; present on admit to CIR 06/03/23)    Physical Exam: Vital Signs Blood pressure 107/89, pulse 77, temperature 98 F (36.7 C), resp. rate 16, height 6' (1.829 m), weight (!) 155.5 kg, SpO2 98%.   General: No acute distress, laying in bed.   Morbidly obese.  Working in gym with OT. Mood and affect are appropriate; intermittently tearful-ongoing Heart: Regular rate and rhythm no rubs murmurs or extra sounds Lungs: Clear to auscultation, breathing unlabored, no rales or wheezes.  On 2 L nasal cannula. Abdomen: Positive bowel sounds, soft nontender to palpation, nondistended Extremities: 1+ edema B/L LE-unchanged Neurologic: Alert and awake, cranial nerves II through XII grossly intact Altered sensation bilateral feet, with extreme hypersensitivity Moving bilateral upper extremities antigravity against resistance; right lower extremity absent dorsiflexion plantarflexion, left lower extremity 4 out of 5 throughout limited by pain with movement -unchanged Plantar flexor tone in right lower extremity, MAS 2-not examined in wheelchair today    Musculoskeletal: No evidence of MTP swelling on the left side no swelling at the ankle joint.  There is evidence of peripheral edema 1+ both feet Is tight at end range with abd and ext rotation and arm flexion  Assessment/Plan: 1. Functional deficits due to sepsis with debility  Stable for D/C today F/u nephro in HD F/u PCP on 11/26 at 215pm F/u  wound care 12/5 See D/C summary See D/C instructions   Care Tool:  Bathing    Body parts bathed by patient: Right arm, Left arm, Chest, Abdomen, Front perineal area, Face, Buttocks, Left upper leg, Right upper leg, Right lower leg, Left lower leg   Body parts bathed by helper: Left lower leg, Right lower leg, Left upper leg, Buttocks, Right upper leg     Bathing assist Assist Level: Supervision/Verbal cueing     Upper Body Dressing/Undressing Upper body dressing   What is the patient wearing?: Pull over shirt    Upper body assist Assist Level: Set up assist    Lower Body Dressing/Undressing Lower body dressing      What is the patient wearing?: Pants, Underwear/pull up     Lower body assist Assist for lower body dressing: Contact  Guard/Touching assist     Toileting Toileting    Toileting assist Assist for toileting: Contact Guard/Touching assist     Transfers Chair/bed transfer  Transfers assist     Chair/bed transfer assist level: Supervision/Verbal cueing     Locomotion Ambulation   Ambulation assist      Assist level: Supervision/Verbal cueing Assistive device: Walker-rolling Max distance: 92ft   Walk 10 feet activity   Assist  Walk 10 feet activity did not occur: Safety/medical concerns  Assist level: Supervision/Verbal cueing Assistive device: Walker-rolling   Walk 50 feet activity   Assist Walk 50 feet with 2 turns activity did not occur: Safety/medical concerns (fatigue)         Walk 150 feet activity   Assist Walk 150 feet activity did not occur: Safety/medical concerns         Walk 10 feet on uneven surface  activity   Assist Walk 10 feet on uneven surfaces activity did not occur: Safety/medical concerns         Wheelchair     Assist Is the patient using a wheelchair?: Yes Type of Wheelchair: Manual    Wheelchair assist level: Total Assistance - Patient < 25% Max wheelchair distance: 23ft    Wheelchair 50 feet with 2 turns activity    Assist        Assist Level: Total Assistance - Patient < 25%   Wheelchair 150 feet activity     Assist      Assist Level: Total Assistance - Patient < 25%   Blood pressure 107/89, pulse 77, temperature 98 F (36.7 C), resp. rate 16, height 6' (1.829 m), weight (!) 155.5 kg, SpO2 98%.  Medical Problem List and Plan: 1. Functional deficits secondary to debility from MSSA bacteremia, c/b multiple medical comorbidities             -patient may shower with buttocks wound and lines covered             -ELOS/Goals: 11/26 , supervision PT/OT,     2.  Antithrombotics: -DVT/anticoagulation:  Pharmaceutical: Eliquis 5 mg BID             -antiplatelet therapy: none   3. Pain Management: Tylenol as  needed             -PO Dilaudid initiated on acute side due to nausea with oxycodone--> On admission patient requests to switch back, changed back to oxycodone 15 mg Q4H PRN and Dced Dilaudid, Increasing Oxy use to TID hope to wean this if patient can get good relief with pregabalin              -   lidocaine  cream, discussed pregabalin which pt declines at this time   -have discussed etiology of numbness in legs.  -DC gabapentin and start pregabalin 50 mg will give an extra 50 mg after dialysis as well -Monitor response to medication change to Lyrica 11-23: No appreciable change with Lyrica, was only started Thursday so we will give the weekend to monitor for response 11-24: Patient unsure if Lyrica versus gabapentin more effective, only appreciable effects are from oxycodone.  Declined further dose adjusted, primary team can address. 11/25 will followup with PCP for pain control, has appt tomorrow               4. Mood/Behavior/Sleep: LCSW to evaluate and provide emotional support             -continue melatonin 5 mg q HS             -antipsychotic agents: n/a          -11/11 Start alprazolam 0.25mg  PRN BID for anxiety - Neuropsych following pt, pt expressed desire to cont with tx   5. Neuropsych/cognition: This patient is capable of making decisions on his own behalf.   6. Skin/Wound Care: Routine skin care checks             -pressure injury left buttock/posterior thigh>>continue wound care              - Offered LAL mattress given HX pressure wounds; patient declines- RIght buttocks wound has been healing well with standard mattress   7. Fluids/Electrolytes/Nutrition: Routine Is and Os and follow-up chemistries -Renal diet with 1200 cc fluid restriction             -Continue Fosrenol 2000 mg 3 times daily with meals   8: Hypotension: monitor TID and prn             -continue midodrine 30 mg 3 times daily with meals Encourage patient to discuss dosing with nephrology. No current  orthostatic drops on current dose   -BP stable, continue current regimen 11-23/24: BP stable   Vitals:   06/20/23 0125 06/20/23 0430  BP: 106/64 107/89  Pulse: 60 77  Resp: 18 16  Temp: 97.9 F (36.6 C) 98 F (36.7 C)  SpO2: 100% 98%      9: pAF: on Eliquis              -unable to tolerate amiodarone (requires midodrine for BP so not a candidate for nodal agents)On M-W-F digoxin    10: Hypothyroidism: Continue Synthroid 25 mcg daily   11: Chronic systolic heart failure: EF ~30-35%             -continue digoxin 0.125 mg  3 x weekly             -daily weight             11/16 -volume mgt>>HD 3 x weekly--pt refused Friday due to timing.  Now on Tu Th Sa   11-23/24: Weights remain stable.  Oxygen utilization stable at 2 L.  Monitor   Filed Weights   06/18/23 1822 06/19/23 0500 06/20/23 0127  Weight: (!) 159 kg (!) 158 kg (!) 155.5 kg      12: Chronic hypoxic respiratory failure: on home O2 5 L via [place]   13: LE neuropathy/ R foot drop: CAM boot              - Pattern of weakness and sensory loss consistent with sciatic neuropathy above the knee (mild  weakness  hamstrings); likely compressive from ICU stay - may have some improvement over time  Has been on max dose  (renal) for gabapentin, now on pregabalin 50mg   daily with additional 50mg  after HD.  Max renal dose 75mg  , PCP may consider increasing in OP setting     14: Morbid Obesity: Body mass index is 46.49 kg/m.    15: Anemia: on ESP   16: History or HIT positivity>>no heparin   17: MSSA bacteremia: will continue Ancef 2 g with HD treatment 3x/week (through 12/09)   18. Vocal hoarseness s/p extubation. Consider ENT evaluation if no improvement.   11/16-discussed etiology and potential plan with pt today--likely outpt ENT referral if still symptomatic at that point  11-24: Ongoing, agree with outpatient ENT referral  19.  ESRD on HD- nephro to manage   -Reviewed nephrology note  20. Insomnia  -  melatonin 5mg   somewhat helpful  -Sleep mostly limited by bilateral lower extremity hypersensitivity  21.  Right shoulder pain exam consistent with Right shoulder impingement syndrome vs tear-  xray shows mild R AC jt OA.  MRI shows intact rotator cuff, + adhesive capsulitis  22.  Left foot and ankle pain.  Mainly left great toe MTP does not look inflamed pain is mainly with weightbearing.  Suspect that this is mainly due to neuropathic pain but will check x-rays as well.  Mild OA Left great toe MTP    17 days A FACE TO FACE EVALUATION WAS PERFORMED  Erick Colace 06/20/2023, 10:47 AM

## 2023-06-20 NOTE — Telephone Encounter (Signed)
Pharmacy Patient Advocate Encounter   Received notification that prior authorization for Midodrine HCl 10MG  tabletsis required/requested.   Insurance verification completed.   The patient is insured through [facility] .   Per test claim: PA required; PA submitted to above mentioned insurance via CoverMyMeds Key/confirmation #/EOC [id number] Status is pending

## 2023-06-20 NOTE — Progress Notes (Signed)
  [facility] Progress Note    Assessment/ Plan:   1. Acute hypoxic respiratory failure: Remains on supplemental oxygen and ongoing efforts at continued ultrafiltration/lowering EDW with dialysis (anticipating that he has sustained weight loss during this hospitalization). He is stable at baseline O2 requirement now.  2. MSSA Bacteremia: TDC replaced after he underwent line holiday 05/26/23-05/30/23.  Continue cefazolin IV 2g with HD per pmd (until 07/04/23) 2. ESRD: this week will change back to MWF (supposed to be dc'd home Monday). If dc'd home today from rehab, next outpt HD would be Tuesday per the holiday schedule (HD holiday sched: sun, tues, fri this week) 3. HTN/volume: He remains on midodrine 30mg  TID 4. Anemia: Hgb 11.3, currently on ESA. Will d/c ESA, resume once hgb <10 5. Secondary hyperparathyroidism: Calcium level elevated and VDRA remains on hold, phosphorus 6.3. on lanthanum  Phos binder 6. Nutrition:  Continue renal diet and protein supplements 7. H/o HIT: no heparin w/ dialysis, block catheter w/ sodium citrate 8.  Deconditioning right foot drop- rehab  with CIR  Subjective:   Patient seen and examined bedside. No acute events, no complaints. Tolerated HD yesterday with net UF 4L He reports that he will be discharged today   Objective:   BP 107/89 (BP Location: Left Arm)   Pulse 77   Temp 98 F (36.7 C)   Resp 16   Ht 6' (1.829 m)   Wt (!) 155.5 kg   SpO2 98%   BMI 46.49 kg/m   Intake/Output Summary (Last 24 hours) at 06/20/2023 0857 Last data filed at 06/20/2023 0029 Gross per 24 hour  Intake --  Output 4000 ml  Net -4000 ml   Weight change: -7.5 kg  Physical Exam: Gen: NAD, laying flat in bed CVS: RRR Resp: CTA B/L Abd: obese Ext: refused (bc of neuropathy) Neuro: awake, alert Dialysis access: right TDC  Imaging: No results found.  Labs: BMET Recent Labs  Lab 06/14/23 1153 06/18/23 1420 06/19/23 1045  NA 136 137 136  K  5.3* 5.4* 5.0  CL 100 97* 98  CO2 22 25 21*  GLUCOSE 90 92 114*  BUN 41* 46* 34*  CREATININE 10.99* 11.24* 7.72*  CALCIUM 9.7 9.4 10.2  PHOS 6.5* 7.0* 6.3*   CBC Recent Labs  Lab 06/14/23 1153 06/18/23 0513 06/19/23 1045  WBC 5.4 6.2 5.5  HGB 10.8* 9.6* 11.3*  HCT 35.0* 31.0* 36.3*  MCV 100.0 100.0 100.6*  PLT 228 205 222    Medications:     apixaban  5 mg Oral BID   Chlorhexidine Gluconate Cloth  6 each Topical Q0600   Chlorhexidine Gluconate Cloth  6 each Topical Q0600   cholecalciferol  400 Units Oral Daily   darbepoetin (ARANESP) injection - DIALYSIS  60 mcg Subcutaneous Q Sat-1800   digoxin  0.125 mg Oral Once per day on Monday Wednesday Friday   Gerhardt's butt cream   Topical TID   lanthanum  2,000 mg Oral TID WC   leptospermum manuka honey  1 Application Topical Daily   levothyroxine  25 mcg Oral Q0600   melatonin  5 mg Oral QHS   midodrine  30 mg Oral TID WC   pregabalin  50 mg Oral Daily   pregabalin  50 mg Oral Once per day on Tuesday Thursday Saturday      Anthony Sar, MD [facility] 06/20/2023, 8:57 AM

## 2023-06-21 ENCOUNTER — Other Ambulatory Visit (HOSPITAL_COMMUNITY): Payer: Self-pay

## 2023-06-21 ENCOUNTER — Other Ambulatory Visit: Payer: Self-pay

## 2023-06-21 ENCOUNTER — Ambulatory Visit: Payer: Medicaid Other | Admitting: Student

## 2023-06-21 ENCOUNTER — Telehealth: Payer: Self-pay

## 2023-06-21 VITALS — BP 129/105 | HR 114 | Temp 98.3°F | Ht 72.0 in

## 2023-06-21 DIAGNOSIS — R5381 Other malaise: Secondary | ICD-10-CM | POA: Diagnosis not present

## 2023-06-21 DIAGNOSIS — R490 Dysphonia: Secondary | ICD-10-CM

## 2023-06-21 DIAGNOSIS — Z8619 Personal history of other infectious and parasitic diseases: Secondary | ICD-10-CM

## 2023-06-21 DIAGNOSIS — N186 End stage renal disease: Secondary | ICD-10-CM | POA: Diagnosis not present

## 2023-06-21 DIAGNOSIS — Z992 Dependence on renal dialysis: Secondary | ICD-10-CM

## 2023-06-21 DIAGNOSIS — I5020 Unspecified systolic (congestive) heart failure: Secondary | ICD-10-CM | POA: Diagnosis not present

## 2023-06-21 DIAGNOSIS — E1140 Type 2 diabetes mellitus with diabetic neuropathy, unspecified: Secondary | ICD-10-CM | POA: Diagnosis not present

## 2023-06-21 DIAGNOSIS — I959 Hypotension, unspecified: Secondary | ICD-10-CM | POA: Diagnosis not present

## 2023-06-21 DIAGNOSIS — L905 Scar conditions and fibrosis of skin: Secondary | ICD-10-CM | POA: Insufficient documentation

## 2023-06-21 DIAGNOSIS — E1122 Type 2 diabetes mellitus with diabetic chronic kidney disease: Secondary | ICD-10-CM | POA: Diagnosis present

## 2023-06-21 DIAGNOSIS — G629 Polyneuropathy, unspecified: Secondary | ICD-10-CM

## 2023-06-21 LAB — POCT GLYCOSYLATED HEMOGLOBIN (HGB A1C): Hemoglobin A1C: 4.6 % (ref 4.0–5.6)

## 2023-06-21 LAB — GLUCOSE, CAPILLARY: Glucose-Capillary: 105 mg/dL — ABNORMAL HIGH (ref 70–99)

## 2023-06-21 MED ORDER — OXYCODONE HCL 10 MG PO TABS: 10.0000 mg | ORAL_TABLET | Freq: Three times a day (TID) | ORAL | 0 refills | Status: DC | PRN

## 2023-06-21 NOTE — Telephone Encounter (Signed)
Pharmacy Patient Advocate Encounter  Received notification from [facility] that Prior Authorization for Midodrine HCl 10MG  tablets has been APPROVED from 06-20-2023 to 06-19-2024   PA #/Case ID/Reference #: [id number]

## 2023-06-21 NOTE — Assessment & Plan Note (Signed)
Chronically low.  Takes midodrine 30 three times a day.  Stable.

## 2023-06-21 NOTE — Assessment & Plan Note (Addendum)
He has a trouble making it to dialysis since he was ill over this past summer-septic pneumonia requiring intubation resulting in marked debility, foot drop, polyneuropathy.  Fortunately now he has recovered some of his strength.  He is able to make it out of his apartment as his mother drives her car around the back of their apartment and he gets them from there.  He will go to dialysis tomorrow and then start a Tuesday Thursday Saturday schedule.  The dialysis while he was here for inpatient rehab.  No signs of uremia today. - BMP today.

## 2023-06-21 NOTE — Assessment & Plan Note (Signed)
A1c 4.6 today.  This is great.  Continue lifestyle modification.

## 2023-06-21 NOTE — Patient Instructions (Addendum)
[name],  It was good to see you today.  I am glad that you are able to spend time in inpatient rehab to rebuild her strength.  Continue to use the tools that he learned there to improve your strength.  Continue to work with home health.  It is essential that you attend dialysis as scheduled time or else you are a high risk to return to the hospital.  For your pain, let's continue lyrica and oxycodone. Oxycodone is not good for you long term, we will need to work on stopping that and increasing Lyrica. Just take half a tablet of the oxycodone if possible.  Please return in 2 weeks for pain management.  ~Dr. Ninfa Meeker

## 2023-06-21 NOTE — Transitions of Care (Post Inpatient/ED Visit) (Signed)
06/21/2023  Name: Phillip Young MRN: [id number] DOB: 11-23-1985  Today's TOC FU Call Status: Today's TOC FU Call Status:: Successful TOC FU Call Completed TOC FU Call Complete Date: 06/21/23 Patient's Name and Date of Birth confirmed.  Transition Care Management Follow-up Telephone Call Date of Discharge: 06/20/23 Discharge Facility: Other [facility]) Name of Other ([facility]) [facility]charge Facility: [facility] Type of Discharge: Inpatient Admission Primary Inpatient Discharge Diagnosis:: dysphonia How have you been since you were released from the hospital?: Better Any questions or concerns?: No  Items Reviewed: Did you receive and understand the discharge instructions provided?: Yes Medications obtained,verified, and reconciled?: Yes (Medications Reviewed) Any new allergies since your discharge?: No Dietary orders reviewed?: Yes Do you have support at home?: Yes People in Home: parent(s), other relative(s)  Medications Reviewed Today: Medications Reviewed Today     Reviewed by Karena Addison, LPN (Licensed Practical Nurse) on 06/21/23 at 1003  Med List Status: <None>   Medication Order Taking? Sig Documenting Provider Last Dose Status Informant  acetaminophen (TYLENOL) 325 MG tablet [id number]  Take 1-2 tablets (325-650 mg total) by mouth every 4 (four) hours as needed for mild pain (pain score 1-3). Setzer, Lynnell Jude, PA-C  Active   ALPRAZolam Prudy Feeler) 0.25 MG tablet [id number]  Take 1 tablet (0.25 mg total) by mouth 2 (two) times daily as needed for anxiety. Setzer, Lynnell Jude, PA-C  Active   apixaban (ELIQUIS) 5 MG TABS tablet [id number]  Take 1 tablet (5 mg total) by mouth 2 (two) times daily. [name], DO  Active   ceFAZolin (ANCEF) 2-4 GM/100ML-% IVPB [id number]  Inject 100 mLs (2 g total) into the vein every Monday, Wednesday, and Friday with hemodialysis. [name], DO  Active   cholecalciferol (VITAMIN D3) 10 MCG (400 UNIT) TABS tablet [id number]   Take 1 tablet (400 Units total) by mouth daily. Setzer, Lynnell Jude, PA-C  Active   Darbepoetin Alfa (ARANESP) 150 MCG/0.3ML SOSY injection [id number]  Inject 0.3 mLs (150 mcg total) into the skin every Saturday at 6 PM. [name], DO  Active   digoxin (LANOXIN) 0.125 MG tablet [id number]  Take 1 tablet (0.125 mg total) by mouth 3 (three) times a week. Setzer, Lynnell Jude, PA-C  Active   lanthanum (FOSRENOL) 1000 MG chewable tablet [id number]  Chew 2 tablets (2,000 mg total) by mouth 3 (three) times daily with meals. Setzer, Lynnell Jude, PA-C  Active   leptospermum manuka honey (MEDIHONEY) PSTE paste [id number]  Apply 1 Application topically daily. [name], DO  Active   levothyroxine (SYNTHROID) 25 MCG tablet [id number]  Take 1 tablet (25 mcg total) by mouth daily at 6 (six) AM. Valetta Fuller, Lynnell Jude, PA-C  Active   melatonin 5 MG TABS [id number] No Take 1 tablet (5 mg total) by mouth at bedtime. Crissie Sickles, MD 05/17/2023 Active Self, Pharmacy Records  midodrine (PROAMATINE) 10 MG tablet [id number]  Take 3 tablets (30 mg total) by mouth 3 (three) times daily with meals. Setzer, Lynnell Jude, PA-C  Active   Nystatin (GERHARDT'S BUTT CREAM) CREA [id number]  Apply 1 Application topically 3 (three) times daily. [name], DO  Active   ondansetron (ZOFRAN) 4 MG tablet [id number]  Take 1 tablet (4 mg total) by mouth daily as needed for nausea or vomiting. Setzer, Lynnell Jude, PA-C  Active   oxyCODONE (ROXICODONE) 5 MG immediate release tablet [id number]  Take 1-2 tablets (5-10 mg total) by mouth every 4 (four) hours as needed for severe pain (pain score 7-10). Wendi Maya  J, PA-C  Active   pregabalin (LYRICA) 50 MG capsule [id number]  Take one capsule daily and every Tuesday, Thursday and Saturday with dialysis (on each dialysis day). Setzer, Lynnell Jude, PA-C  Active             Home Care and Equipment/Supplies: Were Home Health Services Ordered?: Yes Name of Home Health Agency:: unknown Has  Agency set up a time to come to your home?: No Any new equipment or medical supplies ordered?: NA  Functional Questionnaire: Do you need assistance with bathing/showering or dressing?: No Do you need assistance with meal preparation?: No Do you need assistance with eating?: No Do you have difficulty maintaining continence: No Do you need assistance with getting out of bed/getting out of a chair/moving?: No Do you have difficulty managing or taking your medications?: No  Follow up appointments reviewed: PCP Follow-up appointment confirmed?: Yes Date of PCP follow-up appointment?: 06/21/23 Follow-up Provider: [facility] Follow-up appointment confirmed?: NA Do you need transportation to your follow-up appointment?: No Do you understand care options if your condition(s) worsen?: Yes-patient verbalized understanding    SIGNATURE Karena Addison, LPN [facility] Nurse Health Advisor Direct Dial [phone]

## 2023-06-21 NOTE — Assessment & Plan Note (Signed)
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  He used gabapentin, recently changed to Lyrica, these do help but are limited due to his ESRD.  Cymbalta contraindicated in ESRD.  TCAs may be an option for him.  At the moment he is also using oxycodone for this chronic pain that started about 4 months ago now.  Will continue his oxycodone for now, but goal is to stop this. - Oxycodone 10 3 times daily as needed.  Encourage as little use as possible. He is very agreeable to reducing oxycodone.  2-week supply given.  At next visit we will reduce dose, and consider starting TCA.

## 2023-06-21 NOTE — Assessment & Plan Note (Addendum)
Currently euvolemic.  Present masses 155 kg, 342 pounds.  No dyspnea.  Does use chronic oxygen, 5 L with activity, less at rest, at times he tolerates no oxygen when he is at rest. -Follows with cardiology.  GDMT contraindicated due to chronic hypertension.  Does not make urine for diuretics.  Volume control at HD.

## 2023-06-21 NOTE — Assessment & Plan Note (Signed)
BMI of 46.  Debility per above limits activity.  Discussed healthy eating.

## 2023-06-21 NOTE — Assessment & Plan Note (Addendum)
Marked deconditioning and ambulatory dysfunction after illness this summer.  He developed a septic pneumonia requiring ICU care, intubation and thereafter developed ICU neuropathy, foot drop.  A subsequent hospitalization was done this fall due to missing dialysis causing dyspnea, thereafter developed an SSA sepsis.  That was stabilized he was sent to inpatient rehab on 5 W. at [facility], [facility].  He was there for 2 weeks.  He does have a foot brace for his foot drop.  He has home health.  He can walk about 35 feet with a walker.  He remains motivated to improve. - Continue home health PT

## 2023-06-21 NOTE — Assessment & Plan Note (Addendum)
Currently finishing treatment for MSSA bacteremia via Ancef.  This will end on December 8.  No current signs of recurrent infection.  Afebrile, no chills.  He will finish Ancef via HD. - CBC

## 2023-06-21 NOTE — Progress Notes (Addendum)
CC: HFU debility, chronic pain, MSSA bacteremia, missing dialysis  HPI:  [name] is a 37 y.o. male living with a history stated below and presents today for HFU. Please see problem based assessment and plan for additional details.  Past Medical History:  Diagnosis Date   Acute on chronic respiratory failure with hypoxia ([name]) 04/21/2021   Acute on chronic systolic (congestive) heart failure ([name]) 02/26/2020   Amiodarone toxicity    Anemia    Atrial flutter ([name])    Biventricular congestive heart failure ([name])    Chronic hypoxemic respiratory failure ([name])    Class 3 severe obesity due to excess calories with serious comorbidity and body mass index (BMI) of 50.0 to 59.9 in adult [facility]) 02/26/2020   Essential hypertension 02/26/2020   GERD without esophagitis 02/26/2020   Hidradenitis suppurativa 02/26/2020   NICM (nonischemic cardiomyopathy) ([name])    Obesity hypoventilation syndrome ([name])    OSA (obstructive sleep apnea)    PAF (paroxysmal atrial fibrillation) ([name])    Pneumonia    Prediabetes 02/26/2020    Current Outpatient Medications on File Prior to Visit  Medication Sig Dispense Refill   acetaminophen (TYLENOL) 325 MG tablet Take 1-2 tablets (325-650 mg total) by mouth every 4 (four) hours as needed for mild pain (pain score 1-3).     apixaban (ELIQUIS) 5 MG TABS tablet Take 1 tablet (5 mg total) by mouth 2 (two) times daily.     ceFAZolin (ANCEF) 2-4 GM/100ML-% IVPB Inject 100 mLs (2 g total) into the vein every Monday, Wednesday, and Friday with hemodialysis.     cholecalciferol (VITAMIN D3) 10 MCG (400 UNIT) TABS tablet Take 1 tablet (400 Units total) by mouth daily. 100 tablet 0   Darbepoetin Alfa (ARANESP) 150 MCG/0.3ML SOSY injection Inject 0.3 mLs (150 mcg total) into the skin every Saturday at 6 PM.     digoxin (LANOXIN) 0.125 MG tablet Take 1 tablet (0.125 mg total) by mouth 3 (three) times a week. 12 tablet 0   lanthanum (FOSRENOL) 1000 MG chewable  tablet Chew 2 tablets (2,000 mg total) by mouth 3 (three) times daily with meals. 180 tablet 0   leptospermum manuka honey (MEDIHONEY) PSTE paste Apply 1 Application topically daily. 15 mL 0   levothyroxine (SYNTHROID) 25 MCG tablet Take 1 tablet (25 mcg total) by mouth daily at 6 (six) AM. 30 tablet 0   melatonin 5 MG TABS Take 1 tablet (5 mg total) by mouth at bedtime. 30 tablet 0   midodrine (PROAMATINE) 10 MG tablet Take 3 tablets (30 mg total) by mouth 3 (three) times daily with meals. 270 tablet 0   Nystatin (GERHARDT'S BUTT CREAM) CREA Apply 1 Application topically 3 (three) times daily. 1 each 0   ondansetron (ZOFRAN) 4 MG tablet Take 1 tablet (4 mg total) by mouth daily as needed for nausea or vomiting. 30 tablet 0   pregabalin (LYRICA) 50 MG capsule Take one capsule daily and every Tuesday, Thursday and Saturday with dialysis (on each dialysis day). 42 capsule 0   No current facility-administered medications on file prior to visit.    Family History  Problem Relation Age of Onset   Heart disease Mother    Hypertension Mother    Pulmonary Hypertension Mother    Drug abuse Father        died due to Heroin overdose    Social History   Socioeconomic History   Marital status: Single    Spouse name: Not on file  Number of children: Not on file   Years of education: Not on file   Highest education level: Not on file  Occupational History   Not on file  Tobacco Use   Smoking status: Former    Current packs/day: 0.00    Types: Cigarettes    Quit date: 2019    Years since quitting: 5.9    Passive exposure: Current   Smokeless tobacco: Former   Tobacco comments:    quit in 2019  Vaping Use   Vaping status: Never Used  Substance and Sexual Activity   Alcohol use: Never   Drug use: Never   Sexual activity: Not on file  Other Topics Concern   Not on file  Social History Narrative   Not on file   Social Determinants of Health   Financial Resource Strain: Low Risk   (03/24/2023)   Received from [facility]   Overall Financial Resource Strain (CARDIA)    Difficulty of Paying Living Expenses: Not very hard  Food Insecurity: No Food Insecurity (05/18/2023)   Hunger Vital Sign    Worried About Running Out of Food in the Last Year: Never true    Ran Out of Food in the Last Year: Never true  Transportation Needs: No Transportation Needs (05/18/2023)   PRAPARE - Administrator, Civil Service (Medical): No    Lack of Transportation (Non-Medical): No  Physical Activity: Inactive (09/21/2022)   Exercise Vital Sign    Days of Exercise per Week: 0 days    Minutes of Exercise per Session: 0 min  Stress: No Stress Concern Present (04/21/2023)   Received from [facility]   [employer] of Occupational Health - Occupational Stress Questionnaire    Feeling of Stress : Only a little  Social Connections: Moderately Isolated (03/24/2023)   Received from [facility]   Social Connection and Isolation Panel [NHANES]    Frequency of Communication with Friends and Family: More than three times a week    Frequency of Social Gatherings with Friends and Family: More than three times a week    Attends Religious Services: 1 to 4 times per year    Active Member of [employer] or Organizations: No    Attends Banker Meetings: Never    Marital Status: Never married  Intimate Partner Violence: Not At Risk (05/18/2023)   Humiliation, Afraid, Rape, and Kick questionnaire    Fear of Current or Ex-Partner: No    Emotionally Abused: No    Physically Abused: No    Sexually Abused: No    Review of Systems: ROS negative except for what is noted on the assessment and plan.  Vitals:   06/21/23 1451  BP: (!) 129/105  Pulse: (!) 114  Temp: 98.3 F (36.8 C)  TempSrc: Oral  SpO2: 100%  Height: 6' (1.829 m)    Physical Exam: Constitutional: chronically ill-appearing man sitting in wheelchair, in no acute distress HENT: normocephalic atraumatic,  mucous membranes moist Eyes: conjunctiva non-erythematous Cardiovascular: regular rate and rhythm, no m/r/g Pulmonary/Chest: normal work of breathing on room air, lungs clear to auscultation bilaterally MSK: normal bulk and tone Neurological: alert & oriented x 3, no focal deficit Skin: warm and dry Psych: normal mood and behavior  Assessment & Plan:   Patient seen with Dr. Sol Blazing  ESRD (end stage renal disease) on dialysis [facility]) He has a trouble making it to dialysis since he was ill over this past summer-septic pneumonia requiring intubation resulting in marked debility, foot  drop, polyneuropathy.  Fortunately now he has recovered some of his strength.  He is able to make it out of his apartment as his mother drives her car around the back of their apartment and he gets them from there.  He will go to dialysis tomorrow and then start a Tuesday Thursday Saturday schedule.  The dialysis while he was here for inpatient rehab.  No signs of uremia today. - BMP today.  Debility Marked deconditioning and ambulatory dysfunction after illness this summer.  He developed a septic pneumonia requiring ICU care, intubation and thereafter developed ICU neuropathy, foot drop.  A subsequent hospitalization was done this fall due to missing dialysis causing dyspnea, thereafter developed an SSA sepsis.  That was stabilized he was sent to inpatient rehab on 5 W. at [facility], [facility].  He was there for 2 weeks.  He does have a foot brace for his foot drop.  He has home health.  He can walk about 35 feet with a walker.  He remains motivated to improve. - Continue home health PT  Morbid obesity ([name]) BMI of 46.  Debility per above limits activity.  Discussed healthy eating.  Hypotension Chronically low.  Takes midodrine 30 three times a day.  Stable.  HFrEF (heart failure with reduced ejection fraction) ([name]) Currently euvolemic.  Present masses 155 kg, 342 pounds.  No dyspnea.  Does use chronic oxygen, 5  L with activity, less at rest, at times he tolerates no oxygen when he is at rest. -Follows with cardiology.  GDMT contraindicated due to chronic hypertension.  Does not make urine for diuretics.  Volume control at HD.  History of Gram positive sepsis Currently finishing treatment for MSSA bacteremia via Ancef.  This will end on December 8.  No current signs of recurrent infection.  Afebrile, no chills.  He will finish Ancef via HD. 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  He used gabapentin, recently changed to Lyrica, these do help but are limited due to his ESRD.  Cymbalta contraindicated in ESRD.  TCAs may be an option for him.  At the moment he is also using oxycodone for this chronic pain that started about 4 months ago now.  Will continue his oxycodone for now, but goal is to stop this. - Oxycodone 10 3 times daily as needed.  Encourage as little use as possible. He is very agreeable to reducing oxycodone.  2-week supply given.  At next visit we will reduce dose, and consider starting TCA.  Type 2 diabetes mellitus with diabetic chronic kidney disease ([name]) A1c 4.6 today.  This is great.  Continue lifestyle modification.  Hoarseness of voice Voice is hoarse.  This started in the summer 2024 after his intubation for septic pneumonia.  Roughly stable in that time.  No dysphagia.  He has an ENT appointment upcoming.  RTC in two weeks for pain medicine. At that time, would like to reduce his oxycodone. No current pain contract, would like to avoid long-term narcotics. Consider starting TCA for neuropathy.  ADD: spoke to him on phone 12/5, continued pain especially after dialysis. He is worried about continuing dialysis due to pain. Dialysis is essential for him. He should have appropriate amount of oxycodone at pharmacy, will also give him lidocaine cream and the smallest dose of venlafaxine 37.5 which I hoped to start at our appointment  next week. Will see him next week.  [name], D.O. [facility] Internal  Medicine, PGY-1 Phone: [phone] Date 06/22/2023 Time 8:13 AM

## 2023-06-22 ENCOUNTER — Telehealth: Payer: Self-pay

## 2023-06-22 LAB — BMP8+ANION GAP
Anion Gap: 22 mmol/L — ABNORMAL HIGH (ref 10.0–18.0)
BUN/Creatinine Ratio: 5 — ABNORMAL LOW (ref 9–20)
BUN: 41 mg/dL — ABNORMAL HIGH (ref 6–20)
CO2: 19 mmol/L — ABNORMAL LOW (ref 20–29)
Calcium: 10.7 mg/dL — ABNORMAL HIGH (ref 8.7–10.2)
Chloride: 93 mmol/L — ABNORMAL LOW (ref 96–106)
Creatinine, Ser: 8.15 mg/dL — ABNORMAL HIGH (ref 0.76–1.27)
Glucose: 96 mg/dL (ref 70–99)
Potassium: 6.1 mmol/L — ABNORMAL HIGH (ref 3.5–5.2)
Sodium: 134 mmol/L (ref 134–144)
eGFR: 8 mL/min/{1.73_m2} — ABNORMAL LOW (ref >59)

## 2023-06-22 LAB — CBC
Hematocrit: 36.4 % — ABNORMAL LOW (ref 37.5–51.0)
Hemoglobin: 11.7 g/dL — ABNORMAL LOW (ref 13.0–17.7)
MCH: 31.2 pg (ref 26.6–33.0)
MCHC: 32.1 g/dL (ref 31.5–35.7)
MCV: 97 fL (ref 79–97)
Platelets: 230 10*3/uL (ref 150–450)
RBC: 3.75 x10E6/uL — ABNORMAL LOW (ref 4.14–5.80)
RDW: 14.3 % (ref 11.6–15.4)
WBC: 6.7 10*3/uL (ref 3.4–10.8)

## 2023-06-22 NOTE — Telephone Encounter (Signed)
Caller informed of Dr. Wynn Banker reply. Task completed.

## 2023-06-22 NOTE — Telephone Encounter (Signed)
Donita from [facility] called requesting status of HH order. Please return her call@[phone]

## 2023-06-22 NOTE — Progress Notes (Signed)
Recreational Therapy Discharge Summary Patient Details  Name: Phillip Young MRN: [id number] Date of Birth: 1986-01-18 Today's Date: 06/22/2023  Comments on progress toward goals: Pt has made good progress during LOS and discharged home yesterday with mother at supervision level.  TR sessions focused on pt education/leisure education, activity analysis/modifications, activity tolerance, dynamic balance, & coping.  Pt is anxious to return to previously enjoyed activities as able.   Reasons for discharge: discharge from hospital  Follow-up: Home Health  Patient/family agrees with progress made and goals achieved: Yes  [name] 06/22/2023, 8:17 AM

## 2023-06-22 NOTE — Telephone Encounter (Signed)
Return call to Donita from [facility].  Asking about the orders for patient's [facility] Care.  Donita to fax over a form for signature.

## 2023-06-22 NOTE — Telephone Encounter (Signed)
Discharged on 06/19/2024. Follow up as needed.   Donita RN with [facility] called, ([phone]). She wanted to know if you will be signing off on home health visits?  Caller was asked to check with PCP but request would be forwarded.  Please advise.  Thank you.

## 2023-06-22 NOTE — Assessment & Plan Note (Signed)
Voice is hoarse.  This started in the summer 2024 after his intubation for septic pneumonia.  Roughly stable in that time.  No dysphagia.  He has an ENT appointment upcoming.

## 2023-06-24 ENCOUNTER — Other Ambulatory Visit: Payer: Self-pay | Admitting: Internal Medicine

## 2023-06-24 ENCOUNTER — Telehealth: Payer: Self-pay | Admitting: Student

## 2023-06-24 DIAGNOSIS — G629 Polyneuropathy, unspecified: Secondary | ICD-10-CM

## 2023-06-24 MED ORDER — OXYCODONE HCL 10 MG PO TABS: 10.0000 mg | ORAL_TABLET | Freq: Three times a day (TID) | ORAL | 0 refills | Status: DC | PRN

## 2023-06-24 NOTE — Telephone Encounter (Signed)
Page received for on call pager for patient requested to speak with resident on call.  Called the patient back who stated that the pharmacy can't fill opioids from residents and it has to be from the supervising doctor. I called pharmacy to confirm. I was able to confirm this. Tried to send in Rx from my account and it would not work still. I spoke with my Attend Dr. Criselda Peaches who will write the medication for the patient.

## 2023-06-27 NOTE — Progress Notes (Signed)
Internal Medicine Clinic Attending  I saw and evaluated the patient.  I personally confirmed the key portions of the history and exam documented by Dr.  Ninfa Meeker  and I reviewed pertinent patient test results.  The assessment, diagnosis, and plan were formulated together and I agree with the documentation in the resident's note. Unable to tolerate GDMT due to chronic hypotension, not hypertension as mentioned in the resident's note.

## 2023-06-28 ENCOUNTER — Other Ambulatory Visit: Payer: Self-pay

## 2023-06-28 DIAGNOSIS — G629 Polyneuropathy, unspecified: Secondary | ICD-10-CM

## 2023-06-29 MED ORDER — OXYCODONE HCL 10 MG PO TABS: 5.0000 mg | ORAL_TABLET | Freq: Three times a day (TID) | ORAL | 0 refills | Status: DC | PRN

## 2023-06-29 NOTE — Telephone Encounter (Signed)
I saw Phillip Young on 11/26, treating neuropathic pain secondary to recent severe illness with intention to dispense 45 pills of oxycodone 10 for pain to last until upcoming appointment on 12/12 at which point we hope to transition away from this medicine. It looks like he has received 35 pills per PDMP review. I will dispense 10 more pills.

## 2023-06-30 ENCOUNTER — Encounter (HOSPITAL_BASED_OUTPATIENT_CLINIC_OR_DEPARTMENT_OTHER): Payer: MEDICAID | Attending: Internal Medicine | Admitting: Internal Medicine

## 2023-06-30 ENCOUNTER — Telehealth: Payer: Self-pay | Admitting: Internal Medicine

## 2023-06-30 ENCOUNTER — Telehealth: Payer: Self-pay

## 2023-06-30 MED ORDER — VENLAFAXINE HCL ER 37.5 MG PO CP24: 37.5000 mg | ORAL_CAPSULE | Freq: Every day | ORAL | 2 refills | Status: DC

## 2023-06-30 MED ORDER — LIDOCAINE 5 % EX OINT: 1.0000 | TOPICAL_OINTMENT | CUTANEOUS | 0 refills | Status: DC | PRN

## 2023-06-30 NOTE — Addendum Note (Signed)
Addended by: [name] on: 06/30/2023 03:26 PM   Modules accepted: Orders

## 2023-06-30 NOTE — Addendum Note (Signed)
Addended by: [name] on: 06/30/2023 02:13 PM   Modules accepted: Orders

## 2023-06-30 NOTE — Telephone Encounter (Signed)
Patient called in stating they were in excruciating pain and wanted to be switched from oxy 5-10 mg to something stronger, advised patient will need an appointment to be seen and further discuss medication changes, patient hung up phone while transferring to front desk to schedule appt

## 2023-06-30 NOTE — Telephone Encounter (Addendum)
I received page from after-hours lines although clinic is currently open.  A call was from Phillip Young.  Chart review he had called pain management today to to ask about pain medications. On review of PDMP he was sent in oxycodone 10 mg on November 29, 15 tabs.  He was also prescribed and picked up oxycodone 5 mg, 20 tabs on November 25.   He does not currently have a pain contract with [facility].  He was seen in [facility] November 26 by his PCP.  Oxycodone 10 mg 3 times daily was prescribed for neuropathy.  I called and talked with Phillip Young. His mom is on phone as well.   Home health nurse came today and recommended increasing pain medications.   He is concerned about going to dialysis tomorrow as it seems like the pain medications are washed out during this.  At recent visit with Dr. Ninfa Meeker they discussed titrating down on oxycodone due to concern with ESRD.  Reached out to Dr. Scarlette Shorts to clarify if pain is consistent with what he saw from last week.  Could you work reports that he sent in medication refills for his pain medications yesterday and he will reach out to the patient today to discuss his pain.

## 2023-07-01 ENCOUNTER — Encounter (HOSPITAL_COMMUNITY): Payer: Self-pay

## 2023-07-01 ENCOUNTER — Other Ambulatory Visit: Payer: Self-pay

## 2023-07-01 ENCOUNTER — Observation Stay (HOSPITAL_COMMUNITY)
Admission: EM | Admit: 2023-07-01 | Discharge: 2023-07-05 | Disposition: A | Discharge status: Home / Self Care | Payer: Medicaid Other | Attending: Internal Medicine | Admitting: Internal Medicine

## 2023-07-01 ENCOUNTER — Emergency Department (HOSPITAL_COMMUNITY): Payer: Medicaid Other

## 2023-07-01 ENCOUNTER — Telehealth: Payer: Self-pay | Admitting: *Deleted

## 2023-07-01 DIAGNOSIS — N186 End stage renal disease: Secondary | ICD-10-CM | POA: Diagnosis not present

## 2023-07-01 DIAGNOSIS — G5793 Unspecified mononeuropathy of bilateral lower limbs: Secondary | ICD-10-CM

## 2023-07-01 DIAGNOSIS — E039 Hypothyroidism, unspecified: Secondary | ICD-10-CM | POA: Insufficient documentation

## 2023-07-01 DIAGNOSIS — I4819 Other persistent atrial fibrillation: Secondary | ICD-10-CM | POA: Insufficient documentation

## 2023-07-01 DIAGNOSIS — I5020 Unspecified systolic (congestive) heart failure: Secondary | ICD-10-CM | POA: Insufficient documentation

## 2023-07-01 DIAGNOSIS — E875 Hyperkalemia: Secondary | ICD-10-CM | POA: Diagnosis not present

## 2023-07-01 DIAGNOSIS — G629 Polyneuropathy, unspecified: Secondary | ICD-10-CM

## 2023-07-01 DIAGNOSIS — E162 Hypoglycemia, unspecified: Secondary | ICD-10-CM | POA: Insufficient documentation

## 2023-07-01 DIAGNOSIS — Z7901 Long term (current) use of anticoagulants: Secondary | ICD-10-CM | POA: Diagnosis not present

## 2023-07-01 DIAGNOSIS — Z992 Dependence on renal dialysis: Secondary | ICD-10-CM | POA: Diagnosis not present

## 2023-07-01 DIAGNOSIS — R0602 Shortness of breath: Secondary | ICD-10-CM | POA: Diagnosis present

## 2023-07-01 DIAGNOSIS — I959 Hypotension, unspecified: Secondary | ICD-10-CM | POA: Diagnosis not present

## 2023-07-01 DIAGNOSIS — M6281 Muscle weakness (generalized): Secondary | ICD-10-CM | POA: Insufficient documentation

## 2023-07-01 DIAGNOSIS — Z79899 Other long term (current) drug therapy: Secondary | ICD-10-CM | POA: Insufficient documentation

## 2023-07-01 DIAGNOSIS — E8770 Fluid overload, unspecified: Secondary | ICD-10-CM

## 2023-07-01 DIAGNOSIS — R000 Tachycardia, unspecified: Secondary | ICD-10-CM | POA: Diagnosis not present

## 2023-07-01 DIAGNOSIS — D631 Anemia in chronic kidney disease: Secondary | ICD-10-CM | POA: Diagnosis not present

## 2023-07-01 DIAGNOSIS — R2681 Unsteadiness on feet: Secondary | ICD-10-CM | POA: Insufficient documentation

## 2023-07-01 LAB — COMPREHENSIVE METABOLIC PANEL
ALT: 6 U/L (ref 0–44)
AST: 28 U/L (ref 15–41)
Albumin: 3.1 g/dL — ABNORMAL LOW (ref 3.5–5.0)
Alkaline Phosphatase: 87 U/L (ref 38–126)
Anion gap: 17 — ABNORMAL HIGH (ref 5–15)
BUN: 68 mg/dL — ABNORMAL HIGH (ref 6–20)
CO2: 22 mmol/L (ref 22–32)
Calcium: 10.2 mg/dL (ref 8.9–10.3)
Chloride: 99 mmol/L (ref 98–111)
Creatinine, Ser: 13.56 mg/dL — ABNORMAL HIGH (ref 0.61–1.24)
GFR, Estimated: 4 mL/min — ABNORMAL LOW (ref >60)
Glucose, Bld: 85 mg/dL (ref 70–99)
Potassium: 7.5 mmol/L (ref 3.5–5.1)
Sodium: 138 mmol/L (ref 135–145)
Total Bilirubin: 1 mg/dL (ref <1.2)
Total Protein: 7 g/dL (ref 6.5–8.1)

## 2023-07-01 LAB — CBC WITH DIFFERENTIAL/PLATELET
Abs Immature Granulocytes: 0.01 10*3/uL (ref 0.00–0.07)
Basophils Absolute: 0.1 10*3/uL (ref 0.0–0.1)
Basophils Relative: 1 %
Eosinophils Absolute: 0.3 10*3/uL (ref 0.0–0.5)
Eosinophils Relative: 6 %
HCT: 29.7 % — ABNORMAL LOW (ref 39.0–52.0)
Hemoglobin: 9.3 g/dL — ABNORMAL LOW (ref 13.0–17.0)
Immature Granulocytes: 0 %
Lymphocytes Relative: 15 %
Lymphs Abs: 0.9 10*3/uL (ref 0.7–4.0)
MCH: 31.5 pg (ref 26.0–34.0)
MCHC: 31.3 g/dL (ref 30.0–36.0)
MCV: 100.7 fL — ABNORMAL HIGH (ref 80.0–100.0)
Monocytes Absolute: 0.4 10*3/uL (ref 0.1–1.0)
Monocytes Relative: 7 %
Neutro Abs: 4.1 10*3/uL (ref 1.7–7.7)
Neutrophils Relative %: 71 %
Platelets: 211 10*3/uL (ref 150–400)
RBC: 2.95 MIL/uL — ABNORMAL LOW (ref 4.22–5.81)
RDW: 14.8 % (ref 11.5–15.5)
WBC: 5.9 10*3/uL (ref 4.0–10.5)
nRBC: 0 % (ref 0.0–0.2)

## 2023-07-01 LAB — BRAIN NATRIURETIC PEPTIDE: B Natriuretic Peptide: 786.2 pg/mL — ABNORMAL HIGH (ref 0.0–100.0)

## 2023-07-01 LAB — MAGNESIUM: Magnesium: 2 mg/dL (ref 1.7–2.4)

## 2023-07-01 LAB — PHOSPHORUS: Phosphorus: 9.3 mg/dL — ABNORMAL HIGH (ref 2.5–4.6)

## 2023-07-01 MED ORDER — SODIUM ZIRCONIUM CYCLOSILICATE 10 G PO PACK
10.0000 g | PACK | Freq: Once | ORAL | Status: AC
  Administered 2023-07-01: 10 g via ORAL
  Filled 2023-07-01: qty 1

## 2023-07-01 MED ORDER — CHLORHEXIDINE GLUCONATE CLOTH 2 % EX PADS
6.0000 | MEDICATED_PAD | Freq: Every day | CUTANEOUS | Status: DC
  Administered 2023-07-03: 6 via TOPICAL

## 2023-07-01 MED ORDER — OXYCODONE HCL 10 MG PO TABS: 5.0000 mg | ORAL_TABLET | Freq: Three times a day (TID) | ORAL | 0 refills | Status: DC | PRN

## 2023-07-01 MED ORDER — INSULIN ASPART 100 UNIT/ML IV SOLN
5.0000 [IU] | Freq: Once | INTRAVENOUS | Status: AC
  Administered 2023-07-01: 5 [IU] via INTRAVENOUS

## 2023-07-01 MED ORDER — DEXTROSE 50 % IV SOLN
1.0000 | Freq: Once | INTRAVENOUS | Status: AC
Start: 2023-07-01 — End: 2023-07-01
  Administered 2023-07-01: 50 mL via INTRAVENOUS
  Filled 2023-07-01: qty 50

## 2023-07-01 NOTE — ED Provider Notes (Cosign Needed Addendum)
[place] [facility]ERGENCY DEPARTMENT AT [facility] Provider Note   CSN: [id number] Arrival date & time: 07/01/23  2127     History  Chief Complaint  Patient presents with   Shortness of Breath    Phillip Young is a 37 y.o. male.  37 year old male with past medical history of ESRD on HD (MWF), A-fib on Eliquis, hypothyroidism, neuropathy, HFrEF presents here for concerns of feeling poorly and shortness of breath.  Patient states that he missed dialysis both Wednesday and Friday of this week because the transportation service that he utilizes to get from his mother's house to dialysis has been unable to load him into the [place].  He has been more short of breath.  Denies any chest pain.  Overall, has been feeling poorly.  Has been compliant with his anticoagulation.  The history is provided by the patient and medical records.       Home Medications Prior to Admission medications   Medication Sig Start Date End Date Taking? Authorizing Provider  acetaminophen (TYLENOL) 325 MG tablet Take 1-2 tablets (325-650 mg total) by mouth every 4 (four) hours as needed for mild pain (pain score 1-3). 06/20/23   Setzer, Lynnell Jude, PA-C  apixaban (ELIQUIS) 5 MG TABS tablet Take 1 tablet (5 mg total) by mouth 2 (two) times daily. 06/03/23   [name], DO  ceFAZolin (ANCEF) 2-4 GM/100ML-% IVPB Inject 100 mLs (2 g total) into the vein every Monday, Wednesday, and Friday with hemodialysis. 06/03/23 07/04/23  [name], DO  cholecalciferol (VITAMIN D3) 10 MCG (400 UNIT) TABS tablet Take 1 tablet (400 Units total) by mouth daily. 06/20/23   Setzer, Lynnell Jude, PA-C  Darbepoetin Alfa (ARANESP) 150 MCG/0.3ML SOSY injection Inject 0.3 mLs (150 mcg total) into the skin every Saturday at 6 PM. 06/04/23   [name], DO  digoxin (LANOXIN) 0.125 MG tablet Take 1 tablet (0.125 mg total) by mouth 3 (three) times a week. 06/20/23   Setzer, Lynnell Jude, PA-C  lanthanum (FOSRENOL) 1000 MG  chewable tablet Chew 2 tablets (2,000 mg total) by mouth 3 (three) times daily with meals. 06/20/23   Setzer, Lynnell Jude, PA-C  leptospermum manuka honey (MEDIHONEY) PSTE paste Apply 1 Application topically daily. 05/25/23   [name], DO  levothyroxine (SYNTHROID) 25 MCG tablet Take 1 tablet (25 mcg total) by mouth daily at 6 (six) AM. 06/20/23   Setzer, Lynnell Jude, PA-C  lidocaine (XYLOCAINE) 5 % ointment Apply 1 Application topically as needed. 06/30/23   [name], DO  melatonin 5 MG TABS Take 1 tablet (5 mg total) by mouth at bedtime. 06/10/22   Crissie Sickles, MD  midodrine (PROAMATINE) 10 MG tablet Take 3 tablets (30 mg total) by mouth 3 (three) times daily with meals. 06/20/23   Setzer, Lynnell Jude, PA-C  Nystatin (GERHARDT'S BUTT CREAM) CREA Apply 1 Application topically 3 (three) times daily. 05/24/23   [name], DO  ondansetron (ZOFRAN) 4 MG tablet Take 1 tablet (4 mg total) by mouth daily as needed for nausea or vomiting. 06/20/23 06/19/24  Setzer, Lynnell Jude, PA-C  Oxycodone HCl 10 MG TABS Take 0.5 tablets (5 mg total) by mouth 3 (three) times daily as needed for up to 20 doses. 07/01/23   Mercie Eon, MD  pregabalin (LYRICA) 50 MG capsule Take one capsule daily and every Tuesday, Thursday and Saturday with dialysis (on each dialysis day). 06/21/23   Setzer, Lynnell Jude, PA-C  venlafaxine XR (EFFEXOR XR) 37.5 MG 24 hr capsule Take 1  capsule (37.5 mg total) by mouth daily. 06/30/23 06/29/24  [name], DO      Allergies    Amiodarone, Coreg [carvedilol], Heparin, Metoprolol, Amoxicillin, and Other    Review of Systems   As noted in HPI  Physical Exam Updated Vital Signs BP (!) 112/55   Pulse 88   Temp 98 F (36.7 C) (Oral)   Resp (!) 22   Ht 6' (1.829 m)   Wt 108 kg   SpO2 98%   BMI 32.29 kg/m  Physical Exam Vitals reviewed.  Constitutional:      General: He is not in acute distress.    Appearance: He is obese. He is not ill-appearing,  toxic-appearing or diaphoretic.  HENT:     Head: Normocephalic.  Cardiovascular:     Rate and Rhythm: Tachycardia present. Rhythm irregular.     Pulses: Normal pulses.          Radial pulses are 2+ on the right side and 2+ on the left side.       Dorsalis pedis pulses are 2+ on the right side and 2+ on the left side.     Heart sounds: Normal heart sounds. No murmur heard.    No friction rub. No gallop.  Pulmonary:     Effort: Pulmonary effort is normal. No respiratory distress.     Breath sounds: Normal breath sounds. No wheezing, rhonchi or rales.  Abdominal:     General: There is no distension.     Palpations: Abdomen is soft.     Tenderness: There is no abdominal tenderness. There is no guarding or rebound.  Skin:    General: Skin is warm and dry.     Comments: Stage II decubitus ulcer near the coccyx, just right of midline.  No surrounding erythema.  Subcutaneous collections palpated without overlying erythema.  Neurological:     Mental Status: He is alert.     ED Results / Procedures / Treatments   Labs (all labs ordered are listed, but only abnormal results are displayed) Labs Reviewed  COMPREHENSIVE METABOLIC PANEL - Abnormal; Notable for the following components:      Result Value   Potassium 7.5 (*)    BUN 68 (*)    Creatinine, Ser 13.56 (*)    Albumin 3.1 (*)    GFR, Estimated 4 (*)    Anion gap 17 (*)    All other components within normal limits  CBC WITH DIFFERENTIAL/PLATELET - Abnormal; Notable for the following components:   RBC 2.95 (*)    Hemoglobin 9.3 (*)    HCT 29.7 (*)    MCV 100.7 (*)    All other components within normal limits  PHOSPHORUS - Abnormal; Notable for the following components:   Phosphorus 9.3 (*)    All other components within normal limits  BRAIN NATRIURETIC PEPTIDE - Abnormal; Notable for the following components:   B Natriuretic Peptide 786.2 (*)    All other components within normal limits  MAGNESIUM  HEPATITIS B SURFACE  ANTIGEN  HEPATITIS B SURFACE ANTIBODY, QUANTITATIVE    EKG None  Radiology DG Chest Portable 1 View  Result Date: 07/01/2023 CLINICAL DATA:  End-stage renal disease, missed dialysis EXAM: PORTABLE CHEST 1 VIEW COMPARISON:  06/15/2023 FINDINGS: 2 frontal views of the chest demonstrate right internal jugular dialysis catheter unchanged. Stable enlarged cardiac silhouette. No airspace disease, effusion, or pneumothorax. No acute bony abnormalities. IMPRESSION: 1. Stable enlarged cardiac silhouette.  No acute airspace disease. Electronically Signed   By:  Sharlet Salina M.D.   On: 07/01/2023 22:59   CT PELVIS WO CONTRAST  Result Date: 07/01/2023 CLINICAL DATA:  History of chronic sacral wounds assess for deep bony involvement EXAM: CT PELVIS WITHOUT CONTRAST TECHNIQUE: Multidetector CT imaging of the pelvis was performed following the standard protocol without intravenous contrast. RADIATION DOSE REDUCTION: This exam was performed according to the departmental dose-optimization program which includes automated exposure control, adjustment of the mA and/or kV according to patient size and/or use of iterative reconstruction technique. COMPARISON:  04/19/2021 FINDINGS: Urinary Tract: Visualized portions of the right kidney are within normal limits. No obstructive changes are seen. The bladder is decompressed. Bowel: Mild diverticular change of the colon is noted. No evidence of diverticulitis is seen. The appendix is within normal limits. Visualized small bowel is unremarkable. Vascular/Lymphatic: Mild atherosclerotic calcifications of the iliac vessels are seen. No lymphadenopathy is noted. Reproductive:  Prostate is within normal limits. Other:  None. Musculoskeletal: There are significant inflammatory changes identified posterior to the sacrum in the subcutaneous fatty tissue without discrete abscess identified. Focal defect is noted just to the right of the midline adjacent to the inflammatory change which  may represent a focal decubitus ulcer. No definitive bony involvement is seen. No extension to the bony structures of the sacrum is identified. IMPRESSION: Inflammatory changes in the subcutaneous tissues over the sacrum consistent with the given clinical history. No discrete abscess is noted. A questionable decubitus ulcer is noted to the right of the midline. No discrete bony involvement is noted. Electronically Signed   By: Alcide Clever M.D.   On: 07/01/2023 22:55    Procedures Procedures    Medications Ordered in ED Medications  Chlorhexidine Gluconate Cloth 2 % PADS 6 each (has no administration in time range)  insulin aspart (novoLOG) injection 5 Units (5 Units Intravenous Given 07/01/23 2300)    And  dextrose 50 % solution 50 mL (50 mLs Intravenous Given 07/01/23 2259)  sodium zirconium cyclosilicate (LOKELMA) packet 10 g (10 g Oral Given 07/01/23 2300)    ED Course/ Medical Decision Making/ A&P                                 Medical Decision Making Amount and/or Complexity of Data Reviewed Labs: ordered. Radiology: ordered and independent interpretation performed. ECG/medicine tests: ordered and independent interpretation performed.  Risk OTC drugs. Prescription drug management. Decision regarding hospitalization.   37 year old male with history of ESRD presents here with shortness of breath after missing dialysis both today and on Wednesday.  Last dialyzed on Monday.  Vitals reassuring.  On exam, patient is nontoxic-appearing.  He is on his home oxygen requirement of 3 L.  No increased work of breathing or accessory muscle usage noted.  2+ peripheral pulses.  Initial differential diagnosis includes hypervolemia, electrolyte derangements, pneumothorax, pneumonia, arrhythmia, deep space abscess.  I independently interpreted the patient's ECG, which demonstrates irregularly irregular rhythm consistent with A-fib.  Biphasic T waves noted in 2, 3, V5-V6.  No ST segment changes.   No hyperacute T waves.  Nonischemic ECG.  Independently reviewed the patient's imaging.  Chest x-ray is stable for patient.  CT pelvis without deep space abscess.  Independently reviewed the patient's laboratory workup, which is notable for potassium of 7.5, creatinine 13.56, BUN 68.  Labs are consistent with missed dialysis.  Patient's hemoglobin is at his baseline.  BNP is elevated from baseline and is 786 today.  Phosphorus also elevated at 9.3.  Patient's hyperkalemia was treated with insulin 5 units and dextrose.  Patient also given 10 mg Lokelma.  He does not have any ECG changes necessitating calcium at this time.  I spoke with nephrology, who will dialyze the patient tonight.  Patient will likely require multiple dialysis sessions given his volume status.  Feel that he would benefit from hospital admission.  I spoke with Internal Medicine, Dr Nooruddin, who has accepted the patient for admission.  Patient's presentation is most consistent with acute presentation with potential threat to life or bodily function.         Final Clinical Impression(s) / ED Diagnoses Final diagnoses:  Hyperkalemia  Hypervolemia, unspecified hypervolemia type  ESRD needing dialysis [facility])    Rx / DC Orders ED Discharge Orders     None         Rolla Flatten, MD 07/02/23 0000    Rolla Flatten, MD 07/02/23 0001    Rolla Flatten, MD 07/02/23 0001    Gwyneth Sprout, MD 07/03/23 2227

## 2023-07-01 NOTE — H&P (Incomplete)
Date: 07/01/2023               Patient Name:  Phillip Young MRN: [id number]  DOB: 07-24-1986 Age / Sex: 37 y.o., male   PCP: [name], DO         Medical Service: Internal Medicine Teaching Service         Attending Physician: Dr. 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 Pt has missed two dialysis sessions, feels like his body is locking up, felt palpitations.   Denies any fevers, chills  ED Course:  Meds:  Eliquis 5mg  BID  Digoxin .125mg  only on dialysis days  Synthroid  Midodrine 30mg  TID Pregabalin 50mg   Effexor 37.5mg  No outpatient medications have been marked as taking for the 07/01/23 encounter [facility] Encounter).    Past Medical History  Past Surgical History:  Procedure Laterality Date  . ABSCESS DRAINAGE    . AV FISTULA PLACEMENT Left 08/21/2021   Procedure: LEFT ARM ARTERIOVENOUS (AV) FISTULA.;  Surgeon: Nada Libman, MD;  Location: [facility] OR;  Service: Vascular;  Laterality: Left;  . CARDIAC CATHETERIZATION    . CARDIOVERSION N/A 10/09/2021   Procedure: CARDIOVERSION;  Surgeon: Laurey Morale, MD;  Location: [facility] ENDOSCOPY;  Service: Cardiovascular;  Laterality: N/A;  . CARDIOVERSION N/A 05/28/2022   Procedure: CARDIOVERSION;  Surgeon: Laurey Morale, MD;  Location: [facility] ENDOSCOPY;  Service: Cardiovascular;  Laterality: N/A;  . CARDIOVERSION N/A 06/07/2022   Procedure: CARDIOVERSION;  Surgeon: Laurey Morale, MD;  Location: [facility] ENDOSCOPY;  Service:  Cardiovascular;  Laterality: N/A;  . IR FLUORO GUIDE CV LINE RIGHT  03/10/2021  . IR FLUORO GUIDE CV LINE RIGHT  04/22/2021  . IR FLUORO GUIDE CV LINE RIGHT  08/20/2021  . IR FLUORO GUIDE CV LINE RIGHT  03/01/2023  . IR FLUORO GUIDE CV LINE RIGHT  03/09/2023  . IR FLUORO GUIDE CV LINE RIGHT  05/30/2023  . IR PTA VENOUS EXCEPT DIALYSIS CIRCUIT  03/09/2023  . IR REMOVAL TUN CV CATH W/O FL  05/26/2023  . IR REMOVE CV FIBRIN SHEATH  03/09/2023  . IR US GUIDE VASC ACCESS RIGHT  03/10/2021  . IR US GUIDE VASC ACCESS RIGHT  04/22/2021  . IR US GUIDE VASC ACCESS RIGHT  05/30/2023  . RIGHT HEART CATH N/A 03/06/2021   Procedure: RIGHT HEART CATH;  Surgeon: Dolores Patty, MD;  Location: [facility] INVASIVE CV LAB;  Service: Cardiovascular;  Laterality: N/A;  . RIGHT/LEFT HEART CATH AND CORONARY ANGIOGRAPHY N/A 03/04/2020   Procedure: RIGHT/LEFT HEART CATH AND CORONARY ANGIOGRAPHY;  Surgeon: Laurey Morale, MD;  Location: [facility] INVASIVE CV LAB;  Service: Cardiovascular;  Laterality: N/A;  . TEE WITHOUT CARDIOVERSION N/A 05/05/2021   Procedure: TRANSESOPHAGEAL ECHOCARDIOGRAM (TEE);  Surgeon: Laurey Morale, MD;  Location: [facility] ENDOSCOPY;  Service: Cardiovascular;  Laterality: N/A;  . TEMPORARY DIALYSIS CATHETER  03/06/2021   Procedure: TEMPORARY DIALYSIS CATHETER;  Surgeon: Dolores Patty, MD;  Location: [facility] INVASIVE CV LAB;  Service: Cardiovascular;;  Social:  Lives With: Occupation: Support: Level of Function: PCP: Substances:  Family History: ***  Allergies: Allergies as of 07/01/2023 - Review Complete 07/01/2023  Allergen Reaction Noted  . Amiodarone Other (See Comments) 06/01/2022  . Coreg [carvedilol] Shortness Of Breath and Diarrhea 07/07/2020  . Heparin Other (See Comments) 03/05/2021  . Metoprolol Other (See Comments) 08/04/2022  . Amoxicillin Other (See Comments) 01/26/2023  . Other Swelling and Other (See Comments) 01/26/2023    Review of Systems: A complete ROS was negative  except as per HPI.   OBJECTIVE:   Physical Exam: Blood pressure (!) 112/55, pulse 88, temperature 98 F (36.7 C), temperature source Oral, resp. rate (!) 22, height 6' (1.829 m), weight 108 kg, SpO2 98%.  Constitutional: well-appearing *** sitting in ***, in no acute distress HENT: normocephalic atraumatic, mucous membranes moist Eyes: conjunctiva non-erythematous Neck: supple Cardiovascular: regular rate and rhythm, no m/r/g Pulmonary/Chest: normal work of breathing on room air, lungs clear to auscultation bilaterally Abdominal: soft, non-tender, non-distended MSK: normal bulk and tone Neurological: alert & oriented x 3, 5/5 strength in bilateral upper and lower extremities, normal gait Skin: warm and dry Psych: ***  Labs: CBC    Component Value Date/Time   WBC 5.9 07/01/2023 2208   RBC 2.95 (L) 07/01/2023 2208   HGB 9.3 (L) 07/01/2023 2208   HGB 11.7 (L) 06/21/2023 1606   HCT 29.7 (L) 07/01/2023 2208   HCT 36.4 (L) 06/21/2023 1606   PLT 211 07/01/2023 2208   PLT 230 06/21/2023 1606   MCV 100.7 (H) 07/01/2023 2208   MCV 97 06/21/2023 1606   MCH 31.5 07/01/2023 2208   MCHC 31.3 07/01/2023 2208   RDW 14.8 07/01/2023 2208   RDW 14.3 06/21/2023 1606   LYMPHSABS 0.9 07/01/2023 2208   MONOABS 0.4 07/01/2023 2208   EOSABS 0.3 07/01/2023 2208   BASOSABS 0.1 07/01/2023 2208     CMP     Component Value Date/Time   NA 138 07/01/2023 2208   NA 134 06/21/2023 1606   K 7.5 (HH) 07/01/2023 2208   CL 99 07/01/2023 2208   CO2 22 07/01/2023 2208   GLUCOSE 85 07/01/2023 2208   BUN 68 (H) 07/01/2023 2208   BUN 41 (H) 06/21/2023 1606   CREATININE 13.56 (H) 07/01/2023 2208   CREATININE 9.17 (H) 08/26/2021 1014   CALCIUM 10.2 07/01/2023 2208   PROT 7.0 07/01/2023 2208   PROT 7.5 09/15/2021 1147   ALBUMIN 3.1 (L) 07/01/2023 2208   ALBUMIN 4.3 09/15/2021 1147   AST 28 07/01/2023 2208   ALT 6 07/01/2023 2208   ALKPHOS 87 07/01/2023 2208   BILITOT 1.0 07/01/2023 2208   BILITOT  1.1 09/15/2021 1147   GFRNONAA 4 (L) 07/01/2023 2208   GFRAA >60 03/27/2020 1055    Imaging:  EKG: personally reviewed my interpretation is***. Prior EKG***  ASSESSMENT & PLAN:   Assessment & Plan by Problem: Active Problems:   * No active hospital problems. *   Phillip Young is a 37 y.o. person living with a history of *** who presented with *** and admitted for *** on hospital day 0   #Hyperkalemia  Volume Overload  ESRD on HD MWF ***  AFIB ***  HFrEF ***  Anemia of Chronic Disease  Diet: {NAMES:3044014::"Normal","Heart Healthy","Carb-Modified","Renal","Carb/Renal","NPO","TPN","Tube Feeds"} VTE: {NAMES:3044014::"Heparin","Enoxaparin","SCDs","DOAC","None"} IVF: {NAMES:3044014::"None","NS","1/2 NS","LR","D5","D10"},{NAMES:3044014::"None","10cc/hr","25cc/hr","50cc/hr","75cc/hr","100cc/hr","110cc/hr","125cc/hr","Bolus"} Code: {NAMES:3044014::"Full","DNR","DNI","DNR/DNI","Comfort Care","Unknown"}  Prior to Admission Living Arrangement: {NAMES:3044014::"Home, living ***","SNF, ***","Homeless","***"} Anticipated Discharge Location: {NAMES:3044014::"Home","SNF","CIR","***"} Barriers to Discharge: ***  Dispo: Admit patient to {STATUS:3044014::"Observation with expected length of stay  less than 2 midnights.","Inpatient with expected length of stay greater than 2 midnights."}  Signed: Olegario Messier, MD Internal Medicine Resident PGY-2  07/01/2023, 11:28 PM

## 2023-07-01 NOTE — ED Triage Notes (Signed)
Pt from home c/o 2 missed dialysis apts. States transport company are refusing to pick him up as his apartment is difficult to access. States SOB when lying flat but not unusual.

## 2023-07-01 NOTE — Telephone Encounter (Signed)
Call from patient has been unable to go to Dialysis due to transportation.  Transportation has been provided by his Insurance but the drivers are unable to get patient in the [place] for transport.  Patient has missed Dialysis on Wednesday and has not been picked up for today.  Ca;l to the Insurance transportation at [phone] informed them that patient had not been able to go to his Dialysis treatment.   Transportation is coming but are unable to get patient to the [place].  Faith the transportation representative to have an Ambulance Service or transportation Company who will be able to trans[port patient in his W/C to Dialysis.  Patient was called and informed that he will be receiving a call as to what time to expect to be picked up today for his Dialysis treatment.  Patient is also asking if he can get a Motorized wheel chair ordered that will made getting from his apartment  the [place] a lot easier.  Will forward message to patient PCP for W/C order.

## 2023-07-01 NOTE — H&P (Incomplete)
Date: 07/02/2023               Patient Name:  Phillip Young MRN: [id number]  DOB: September 17, 1985 Age / Sex: 37 y.o., male   PCP: [name], DO         Medical Service: Internal Medicine Teaching Service         Attending Physician: Dr. Mercie Eon, MD      First Contact: Dr. Monna Fam, MD Pager [phone]    Second Contact: Dr. Rocky Morel, DO Pager [phone]         After Hours (After 5p/  First Contact Pager: [phone]  weekends / holidays): Second Contact Pager: [phone]   SUBJECTIVE   Chief Complaint: Missed dialysis  History of Present Illness:   [name] is a 37 year old male with past medical history of ESRD on HD MWF, chronic hypoxic respiratory failure 2/2 suspected amiodarone toxicitiy, persistent atrial fibrillation who presents to the emergency department after missing two dialysis sessions. He states that he has been feeling "off" since his first missed dialysis session, with palpitations the feeling like his "body is locking up". His dialysis schedule is MWF, and he was able to go on Monday, but missed Wednesday and Friday due to transportation. Seems that maybe transport was not able for him to lift him and place him in the car.  He denies any fevers, chills, nausea, or vomiting.    ED Course:  Lab results showed potassium at 7.5, given 5U of insulin as well as 10g of Lokelma. Nephrology consulted in the ED for urgent dialysis, IMTS consulted for admission.   Meds:  Eliquis 5mg  BID  Digoxin .125mg  only on dialysis days  Synthroid  Midodrine 30mg  TID Pregabalin 50mg   Effexor 37.5mg  (Prescribed but not taking)  Oxycodone 10mg  TID PRN   Past Medical History ESRD on HD MWF Chronic hypoxic respiratory failure 2/2 suspected amiodarone toxicity Persistent atrial fibrillation HFrEF EF 30-35%  Past Surgical History:  Procedure Laterality Date   ABSCESS DRAINAGE     AV FISTULA PLACEMENT Left 08/21/2021   Procedure: LEFT ARM ARTERIOVENOUS  (AV) FISTULA.;  Surgeon: Nada Libman, MD;  Location: [facility] OR;  Service: Vascular;  Laterality: Left;   CARDIAC CATHETERIZATION     CARDIOVERSION N/A 10/09/2021   Procedure: CARDIOVERSION;  Surgeon: Laurey Morale, MD;  Location: [facility] ENDOSCOPY;  Service: Cardiovascular;  Laterality: N/A;   CARDIOVERSION N/A 05/28/2022   Procedure: CARDIOVERSION;  Surgeon: Laurey Morale, MD;  Location: [facility] ENDOSCOPY;  Service: Cardiovascular;  Laterality: N/A;   CARDIOVERSION N/A 06/07/2022   Procedure: CARDIOVERSION;  Surgeon: Laurey Morale, MD;  Location: [facility] ENDOSCOPY;  Service: Cardiovascular;  Laterality: N/A;   IR FLUORO GUIDE CV LINE RIGHT  03/10/2021   IR FLUORO GUIDE CV LINE RIGHT  04/22/2021   IR FLUORO GUIDE CV LINE RIGHT  08/20/2021   IR FLUORO GUIDE CV LINE RIGHT  03/01/2023   IR FLUORO GUIDE CV LINE RIGHT  03/09/2023   IR FLUORO GUIDE CV LINE RIGHT  05/30/2023   IR PTA VENOUS EXCEPT DIALYSIS CIRCUIT  03/09/2023   IR REMOVAL TUN CV CATH W/O FL  05/26/2023   IR REMOVE CV FIBRIN SHEATH  03/09/2023   IR US GUIDE VASC ACCESS RIGHT  03/10/2021   IR US GUIDE VASC ACCESS RIGHT  04/22/2021   IR US GUIDE VASC ACCESS RIGHT  05/30/2023   RIGHT HEART CATH N/A 03/06/2021   Procedure: RIGHT HEART CATH;  Surgeon: Dolores Patty,  MD;  Location: [facility] INVASIVE CV LAB;  Service: Cardiovascular;  Laterality: N/A;   RIGHT/LEFT HEART CATH AND CORONARY ANGIOGRAPHY N/A 03/04/2020   Procedure: RIGHT/LEFT HEART CATH AND CORONARY ANGIOGRAPHY;  Surgeon: Laurey Morale, MD;  Location: [facility] INVASIVE CV LAB;  Service: Cardiovascular;  Laterality: N/A;   TEE WITHOUT CARDIOVERSION N/A 05/05/2021   Procedure: TRANSESOPHAGEAL ECHOCARDIOGRAM (TEE);  Surgeon: Laurey Morale, MD;  Location: [facility] ENDOSCOPY;  Service: Cardiovascular;  Laterality: N/A;   TEMPORARY DIALYSIS CATHETER  03/06/2021   Procedure: TEMPORARY DIALYSIS CATHETER;  Surgeon: Dolores Patty, MD;  Location: [facility] INVASIVE CV LAB;  Service: Cardiovascular;;     Social:  Lives With:Mother Occupation: Does not work Support: Mom at home is primary caretaker Level of Function: Dependent in all ADLs/IADLs PCP: Dr. [name], DO Substances: Denies use of alcohol or cigarettes   Family History:  Heart disease, HTN - Mother   Allergies: Allergies as of 07/01/2023 - Review Complete 07/01/2023  Allergen Reaction Noted   Amiodarone Other (See Comments) 06/01/2022   Coreg [carvedilol] Shortness Of Breath and Diarrhea 07/07/2020   Heparin Other (See Comments) 03/05/2021   Metoprolol Other (See Comments) 08/04/2022   Amoxicillin Other (See Comments) 01/26/2023   Other Swelling and Other (See Comments) 01/26/2023    Review of Systems: A complete ROS was negative except as per HPI.   OBJECTIVE:   Physical Exam: Blood pressure (!) 112/55, pulse 88, temperature 98 F (36.7 C), temperature source Oral, resp. rate (!) 22, height 6' (1.829 m), weight 108 kg, SpO2 98%.  Constitutional:Ill-appearing male, laying in bed HENT: normocephalic atraumatic, mucous membranes moist, nasal cannula in place Eyes: conjunctiva non-erythematous Neck: supple Cardiovascular: Irregular rate and rhythm no m/r/g Pulmonary/Chest: normal work of breathing on 3L [place], lungs clear to auscultation bilaterally Abdominal: soft, non-tender, non-distended MSK: normal bulk and tone, severe tenderness to palpation at light touch of lower extremities bilaterally Neurological: alert & oriented x 3, unable to assess strength of lower extremities Skin: warm and dry Psych: normal mood and affect  Labs: CBC    Component Value Date/Time   WBC 5.9 07/01/2023 2208   RBC 2.95 (L) 07/01/2023 2208   HGB 9.3 (L) 07/01/2023 2208   HGB 11.7 (L) 06/21/2023 1606   HCT 29.7 (L) 07/01/2023 2208   HCT 36.4 (L) 06/21/2023 1606   PLT 211 07/01/2023 2208   PLT 230 06/21/2023 1606   MCV 100.7 (H) 07/01/2023 2208   MCV 97 06/21/2023 1606   MCH 31.5 07/01/2023 2208   MCHC 31.3  07/01/2023 2208   RDW 14.8 07/01/2023 2208   RDW 14.3 06/21/2023 1606   LYMPHSABS 0.9 07/01/2023 2208   MONOABS 0.4 07/01/2023 2208   EOSABS 0.3 07/01/2023 2208   BASOSABS 0.1 07/01/2023 2208     CMP     Component Value Date/Time   NA 138 07/01/2023 2208   NA 134 06/21/2023 1606   K 7.5 (HH) 07/01/2023 2208   CL 99 07/01/2023 2208   CO2 22 07/01/2023 2208   GLUCOSE 85 07/01/2023 2208   BUN 68 (H) 07/01/2023 2208   BUN 41 (H) 06/21/2023 1606   CREATININE 13.56 (H) 07/01/2023 2208   CREATININE 9.17 (H) 08/26/2021 1014   CALCIUM 10.2 07/01/2023 2208   PROT 7.0 07/01/2023 2208   PROT 7.5 09/15/2021 1147   ALBUMIN 3.1 (L) 07/01/2023 2208   ALBUMIN 4.3 09/15/2021 1147   AST 28 07/01/2023 2208   ALT 6 07/01/2023 2208   ALKPHOS 87 07/01/2023 2208  BILITOT 1.0 07/01/2023 2208   BILITOT 1.1 09/15/2021 1147   GFRNONAA 4 (L) 07/01/2023 2208   GFRAA >60 03/27/2020 1055    Imaging:  Chest X-Ray: No acute airspace disease   CT Pelvis w/o Contrast: Inflammatory changes in subcutaneous tissues over sacrum. No discrete abscess   EKG: personally reviewed my interpretation is tachycardia, irregular rhythm, ST depressions throughout, seems mixed AFIB and Aflutter picture.   ASSESSMENT & PLAN:   Assessment & Plan by Problem: Principal Problem:   Hyperkalemia   Phillip Young is a 37 y.o. person living with a history of ESRD on HD MWF, chronic hypoxic respiratory failure on 2L, and atrial fibrillation who presented after missing two dialysis sessions and admitted for hyperkalemia on hospital day 0   #Hyperkalemia  #Hyperphosphatemia #ESRD on HD MWF Pt presenting after missing two dialysis sessions found to have an elevated potassium of 7.5. Nephrology has already been consulted and he will go for urgent dialysis tonight. He has received 5U of insulin and 10g of Lokelma. I believe he may need a different mode of transportation set before discharge that is able to accommodate.    Plan:  - HD tonight  - RFP in the AM   #AFIB EKG this admission shows minimal amount of P waves, on eliquis 5mg  BID. He is refractory to cardioversion x3. Rate control has been difficult given amiodarone toxicity and low cardiac output to begin with which makes beta blockers, and diltiazem poor options. He takes digoxin .125mg  on dialysis days.  Will continue.  #HFrEF GDMT limited by chronic hypotension. Last EF 30-35% in October 2024 with moderately decreased LV function and global hypokinsas. Internal cavity size severely dilated. Volume is managed with dialysis. Given body habitus, difficult to assess volume status given pain in his legs. Reassuring he is saturating well on his home O2, and chest x-ray does not show increased pumonary vascular congestion.    #Compressive Neuropathy Pt with history of neuropathy after ICU admission where he required intubation and pressor therapy for almost 30 days for cardiogenic and septic shock. Physical exam is very limited by severe pain. He has home pregabalin, for which hasn't helped much. He was previously on oxycodone which did help, however I do think this option in the long term is not safe for him given respiratory issues as well as hypotension. He was supposed to be started on venlafaxine, however was unable to pick this up. He was discontinued off of his gabapentin due to lack of effectiveness.  His pain also limits his ability to tolerate dialysis.   Given ESRD, there are severe limitations to neuropathic pain control agents like duloxetine, pregabalin, and gabapentin. Will restart pregabalin, however he cannot have more than 75mg  per day of this per guidelines.   Plan:  - Tylenol 1000mg  Q6H PRN  - Pregabalin 50mg   - Oxycodone for breakthrough pain     #Hx of MSSA Bacteremia Found on recent admission in late October, recommendations were to continue Ancef with Dialysis on 12/11. Reordered.  #Hypotension On midodrine 30mg  TID, will  continue while inpatient. Will need to keep close attention to BP during dialysis. History is notable for having to abort dialysis due to MAPs dropping below 55.   #Chronic Hypoxic Respiratory Failure 2/2 Amiodarone Toxicity  On his home 2L saturating at 99%.    #Anemia of Chronic Disease #Macrocytic Anemia (mild) Hb currently at 9.3, likely in the setting of multiple chronic severe conditions. Will monitor CBC.   #Hypoglycemia Blood sugar  measured in ED initially 85, however given his hyperkalemia he was given 10U of insulin. Next CBG check was 39. Pt given an AMP of D50, as well as some orange juice. Will recheck CBG  #Decubitus Ulcer  Noted per chart review as well as on CT scan. Pt states that he feels like his wound has gotten better. Unable to visualize due to severe lower extremity pain.   Diet: Renal VTE: DOAC IVF: None,None Code: Full  Prior to Admission Living Arrangement: Home, living with mom Anticipated Discharge Location: Home Barriers to Discharge: Medical Management  Dispo: Admit patient to Inpatient with expected length of stay greater than 2 midnights.  SignedOlegario Messier, MD Internal Medicine Resident PGY-2  07/02/2023, 12:38 AM

## 2023-07-01 NOTE — Progress Notes (Signed)
Nephrology note: 37 y/o male ESRD non-compliant with OP HD, p/w SOB and hyperkalemia. Missed HD x2 as per pt. Plan for HD tonight urgently 1k first hour and then 2K TDC UF as tolerated, lower temp to 36 and albumin prn for IDH. D/w ER provider and HD nurse.  Shermaine Rivet, CKA.

## 2023-07-02 DIAGNOSIS — Z7189 Other specified counseling: Secondary | ICD-10-CM | POA: Diagnosis not present

## 2023-07-02 DIAGNOSIS — N186 End stage renal disease: Secondary | ICD-10-CM | POA: Diagnosis not present

## 2023-07-02 DIAGNOSIS — E875 Hyperkalemia: Secondary | ICD-10-CM | POA: Diagnosis not present

## 2023-07-02 DIAGNOSIS — G5793 Unspecified mononeuropathy of bilateral lower limbs: Secondary | ICD-10-CM | POA: Diagnosis not present

## 2023-07-02 DIAGNOSIS — E8770 Fluid overload, unspecified: Secondary | ICD-10-CM | POA: Diagnosis not present

## 2023-07-02 DIAGNOSIS — Z992 Dependence on renal dialysis: Secondary | ICD-10-CM

## 2023-07-02 DIAGNOSIS — Z515 Encounter for palliative care: Secondary | ICD-10-CM

## 2023-07-02 LAB — HIV ANTIBODY (ROUTINE TESTING W REFLEX): HIV Screen 4th Generation wRfx: NONREACTIVE

## 2023-07-02 LAB — CBC
HCT: 27.9 % — ABNORMAL LOW (ref 39.0–52.0)
Hemoglobin: 9 g/dL — ABNORMAL LOW (ref 13.0–17.0)
MCH: 32 pg (ref 26.0–34.0)
MCHC: 32.3 g/dL (ref 30.0–36.0)
MCV: 99.3 fL (ref 80.0–100.0)
Platelets: 217 10*3/uL (ref 150–400)
RBC: 2.81 MIL/uL — ABNORMAL LOW (ref 4.22–5.81)
RDW: 14.8 % (ref 11.5–15.5)
WBC: 4.9 10*3/uL (ref 4.0–10.5)
nRBC: 0 % (ref 0.0–0.2)

## 2023-07-02 LAB — RENAL FUNCTION PANEL
Albumin: 3.3 g/dL — ABNORMAL LOW (ref 3.5–5.0)
Anion gap: 13 (ref 5–15)
BUN: 38 mg/dL — ABNORMAL HIGH (ref 6–20)
CO2: 25 mmol/L (ref 22–32)
Calcium: 9.4 mg/dL (ref 8.9–10.3)
Chloride: 98 mmol/L (ref 98–111)
Creatinine, Ser: 8.36 mg/dL — ABNORMAL HIGH (ref 0.61–1.24)
GFR, Estimated: 8 mL/min — ABNORMAL LOW (ref >60)
Glucose, Bld: 72 mg/dL (ref 70–99)
Phosphorus: 5.7 mg/dL — ABNORMAL HIGH (ref 2.5–4.6)
Potassium: 5 mmol/L (ref 3.5–5.1)
Sodium: 136 mmol/L (ref 135–145)

## 2023-07-02 LAB — CBG MONITORING, ED
Glucose-Capillary: 103 mg/dL — ABNORMAL HIGH (ref 70–99)
Glucose-Capillary: 39 mg/dL — CL (ref 70–99)
Glucose-Capillary: 79 mg/dL (ref 70–99)

## 2023-07-02 LAB — HEPATITIS B SURFACE ANTIGEN: Hepatitis B Surface Ag: NONREACTIVE

## 2023-07-02 MED ORDER — ONDANSETRON HCL 4 MG PO TABS: 4.0000 mg | ORAL_TABLET | Freq: Every day | ORAL | Status: DC | PRN

## 2023-07-02 MED ORDER — LANTHANUM CARBONATE 500 MG PO CHEW
1000.0000 mg | CHEWABLE_TABLET | Freq: Three times a day (TID) | ORAL | Status: DC
  Administered 2023-07-03: 1000 mg via ORAL
  Filled 2023-07-02 (×12): qty 2

## 2023-07-02 MED ORDER — APIXABAN 5 MG PO TABS
5.0000 mg | ORAL_TABLET | Freq: Two times a day (BID) | ORAL | Status: DC
Start: 2023-07-02 — End: 2023-07-06
  Administered 2023-07-02 – 2023-07-05 (×7): 5 mg via ORAL
  Filled 2023-07-02 (×7): qty 1

## 2023-07-02 MED ORDER — PREGABALIN 25 MG PO CAPS
50.0000 mg | ORAL_CAPSULE | Freq: Every day | ORAL | Status: DC
  Administered 2023-07-02 – 2023-07-05 (×4): 50 mg via ORAL
  Filled 2023-07-02 (×4): qty 2

## 2023-07-02 MED ORDER — ANTICOAGULANT SODIUM CITRATE 4% (200MG/5ML) IV SOLN
5.0000 mL | Freq: Once | Status: AC
  Administered 2023-07-03: 5 mL
  Filled 2023-07-02 (×3): qty 5

## 2023-07-02 MED ORDER — ACETAMINOPHEN 500 MG PO TABS: 1000.0000 mg | ORAL_TABLET | Freq: Four times a day (QID) | ORAL | Status: DC | PRN

## 2023-07-02 MED ORDER — DEXTROSE 50 % IV SOLN
1.0000 | Freq: Once | INTRAVENOUS | Status: AC
  Administered 2023-07-02: 50 mL via INTRAVENOUS

## 2023-07-02 MED ORDER — DIGOXIN 125 MCG PO TABS
0.1250 mg | ORAL_TABLET | ORAL | Status: DC
Start: 2023-07-04 — End: 2023-07-06
  Administered 2023-07-04: 0.125 mg via ORAL
  Filled 2023-07-02: qty 1

## 2023-07-02 MED ORDER — FENTANYL 12 MCG/HR TD PT72
1.0000 | MEDICATED_PATCH | TRANSDERMAL | Status: DC
  Administered 2023-07-02: 1 via TRANSDERMAL
  Filled 2023-07-02: qty 1

## 2023-07-02 MED ORDER — ALBUMIN HUMAN 25 % IV SOLN
25.0000 g | Freq: Once | INTRAVENOUS | Status: AC
  Administered 2023-07-02: 25 g via INTRAVENOUS
  Filled 2023-07-02: qty 100

## 2023-07-02 MED ORDER — ONDANSETRON HCL 4 MG/2ML IJ SOLN
4.0000 mg | Freq: Three times a day (TID) | INTRAMUSCULAR | Status: DC | PRN
  Administered 2023-07-02: 4 mg via INTRAVENOUS
  Filled 2023-07-02: qty 2

## 2023-07-02 MED ORDER — LEVOTHYROXINE SODIUM 25 MCG PO TABS
25.0000 ug | ORAL_TABLET | Freq: Every day | ORAL | Status: DC
  Administered 2023-07-02 – 2023-07-04 (×3): 25 ug via ORAL
  Filled 2023-07-02 (×4): qty 1

## 2023-07-02 MED ORDER — DEXTROSE 50 % IV SOLN
INTRAVENOUS | Status: AC
  Filled 2023-07-02: qty 50

## 2023-07-02 MED ORDER — OXYCODONE HCL 5 MG PO TABS
5.0000 mg | ORAL_TABLET | Freq: Four times a day (QID) | ORAL | Status: DC | PRN
  Administered 2023-07-02 (×2): 5 mg via ORAL
  Filled 2023-07-02 (×2): qty 1

## 2023-07-02 MED ORDER — CEFAZOLIN SODIUM-DEXTROSE 2-4 GM/100ML-% IV SOLN
2.0000 g | INTRAVENOUS | Status: DC
Start: 2023-07-04 — End: 2023-07-05

## 2023-07-02 MED ORDER — OXYCODONE HCL 5 MG PO TABS
10.0000 mg | ORAL_TABLET | Freq: Four times a day (QID) | ORAL | Status: DC | PRN
  Administered 2023-07-02 – 2023-07-04 (×5): 10 mg via ORAL
  Filled 2023-07-02 (×5): qty 2

## 2023-07-02 MED ORDER — MIDODRINE HCL 5 MG PO TABS
30.0000 mg | ORAL_TABLET | Freq: Three times a day (TID) | ORAL | Status: DC
  Administered 2023-07-02 – 2023-07-05 (×7): 30 mg via ORAL
  Filled 2023-07-02 (×9): qty 6

## 2023-07-02 MED ORDER — CEFAZOLIN SODIUM-DEXTROSE 2-4 GM/100ML-% IV SOLN
2.0000 g | Freq: Once | INTRAVENOUS | Status: AC
  Administered 2023-07-02: 2 g via INTRAVENOUS
  Filled 2023-07-02: qty 100

## 2023-07-02 NOTE — Plan of Care (Signed)
  Problem: Education: Goal: Knowledge of General Education information will improve Description: Including pain rating scale, medication(s)/side effects and non-pharmacologic comfort measures Outcome: Progressing   Problem: Clinical Measurements: Goal: Ability to maintain clinical measurements within normal limits will improve Outcome: Progressing Goal: Respiratory complications will improve Outcome: Progressing Goal: Cardiovascular complication will be avoided Outcome: Progressing   Problem: Activity: Goal: Risk for activity intolerance will decrease Outcome: Progressing   Problem: Nutrition: Goal: Adequate nutrition will be maintained Outcome: Progressing   Problem: Pain Management: Goal: General experience of comfort will improve Outcome: Progressing   Problem: Safety: Goal: Ability to remain free from injury will improve Outcome: Progressing   Problem: Skin Integrity: Goal: Risk for impaired skin integrity will decrease Outcome: Progressing

## 2023-07-02 NOTE — Consult Note (Signed)
Consultation Note Date: 07/02/2023   Patient Name: Phillip Young  DOB: [birth year]  MRN: [id number]  Age / Sex: 37 y.o., male  PCP: [name], DO Referring Physician: Mercie Eon, MD  Reason for Consultation: Establishing goals of care  HPI/Patient Profile: 37 y.o. male  with past medical history of ESRD on HD MWF, chronic hypoxic respiratory failure 2/2 suspected amiodarone toxicity, persistent atrial fibrillation, HFrEF admitted on 07/01/2023 with increasing pain and missed HD x2 sessions.   Patient missed HD due to transportation issues. He is admitted with hyperkalemia, hyperphosphatemia, compressive neuropathy. He was seen for extensive GOC discussions during prolonged hospitalization 6/28-8/28 and ultimately discharged to [facility].   PMT has been consulted to assist with goals of care conversation.  Clinical Assessment and Goals of Care:  I have reviewed medical records including EPIC notes, labs and imaging, discussed with RN, assessed the patient and had a phone conversation with patient's mother to discuss diagnosis prognosis, GOC, EOL wishes, disposition and options.  I introduced Palliative Medicine as specialized medical care for people living with serious illness. It focuses on providing relief from the symptoms and stress of a serious illness. The goal is to improve quality of life for both the patient and the family.  We discussed a brief life review of the patient and then focused on their current illness. I attempted to elicit values and goals of care important to the patient.    Medical History Review and Understanding:  Patient has a good understanding of the severity of his illness. He expresses concern that he will die from his kidney disease. His mother reflects on his recent prolonged hospitalization, MRSA infection, concern for inaccurate prognostication at that time.  Social  History: Patient lives at home with his mother again.   Functional and Nutritional State: Patient reports he was "barely" getting around at home with a walker. He reports he has not been eating well due to pain affecting his appetite.  Palliative Symptoms: Neuropathic pain, bilateral legs, sever  Advance Directives: A detailed discussion regarding advanced directives was had. Patient has HCPOA documentation on file reflecting his mother as his surrogate Management consultant. He notes that he does not want to be admitted to [facility] or 2C and would not want blood transfusion or intubation without first contacting his HCPOA.  Discussion: Patient tells me he is in excruciating pain upon my arrival. He is not happy that he has only been given 5mg  of oxycodone. MAR reviewed and discussed patient's updated order for 10mg  oxycodone with him, counseling on frequency of availability and rationale for concern in light of his hypotension. He feels that this has been an ongoing issue in the outpatient setting as well. Treyvian expresses that when he is in this much pain, it makes him feel like "giving up." He feels that while he took on his kidney disease and disease burden, this has been much harder ever since his lower extremity pain began. We discussed the possibility of a pain management specialist after this hospitalization and he is open to this. Kaito does want to get better if this is possible. He is concerned that if he does not, his mother will never be able to "let him go." He was very angry with how his mother was treated by medical providers during his recent illness and prolonged hospitalization, adding that he does not want to take the easy way out after all of her efforts to stand up for him. He understands that it is his decision to  focus his care on whatever he feels is most important, but at the same time he does not feel that he has a choice given his mother's stance. Emotional support and therapeutic  listening was provided.  Offered to coordinate discussion with his mother and she was actually listening on the phone already. Attempted to advocate for patient's wishes and preferences to be heard and prioritized. His mother is very concerned about further workup of his neuropathy and feels she already knows her son's goals and wishes. I strongly emphasized how important it is to continue listening to him, as his feelings may change throughout the course of his illness. Unfortunately, it is difficult to keep her engaged on this matter despite several attempts. She feels that the medical team was "talking like that" when he was critically ill in ICU and he has proven it was all "lies." I expressed that there is no agenda other than following patient's wishes, whether for aggressive care or comfort care, and that his wishes will always be honored by the medical team. Counseled that discussion of best case and worst case scenarios, anticipatory planning, and reviewing goals of care does not mean he is viewed to have a "death sentence." At this time, patient requested assistance with repositioning on the stretcher and agreed to this PA finding nursing assistance.   The difference between aggressive medical intervention and comfort care was considered in light of the patient's goals of care.   Discussed the importance of continued conversation with family and the medical providers regarding overall plan of care and treatment options, ensuring decisions are within the context of the patient's values and GOCs.   Questions and concerns were addressed. The family was encouraged to call with questions or concerns.  PMT will continue to support holistically.   SUMMARY OF RECOMMENDATIONS   -Continue full code/full scope treatment -Patient desires to improve. His quality of life is very limited by severe pain and he is beginning to question how much longer he can tolerate this -Outpatient pain management clinic  referral -Patient has difficulty with managing his mother's expectations and desires for his care, worries she will not honor his wishes in the future -Psychosocial and emotional support provided -PMT remains available as needed  Prognosis:  Unable to determine  Discharge Planning: To Be Determined      Primary Diagnoses: Present on Admission:  Hyperkalemia   Physical Exam Vitals and nursing note reviewed.  Cardiovascular:     Rate and Rhythm: Normal rate.  Pulmonary:     Effort: Pulmonary effort is normal.  Skin:    General: Skin is warm and dry.  Neurological:     Mental Status: He is alert and oriented to person, place, and time.  Psychiatric:        Mood and Affect: Mood normal.        Behavior: Behavior normal.    Vital Signs: BP 115/70 (BP Location: Right Arm)   Pulse 92   Temp 98 F (36.7 C) (Oral)   Resp 19   Ht 6' (1.829 m)   Wt 108 kg   SpO2 100%   BMI 32.29 kg/m  Pain Scale: 0-10   Pain Score: 8    SpO2: SpO2: 100 % O2 Device:SpO2: 100 % O2 Flow Rate: .O2 Flow Rate (L/min): 3 L/min   Total time: I spent 95 minutes in the care of the patient today in the above activities and documenting the encounter.    Artist Bloom Jeni Salles, PA-C  Palliative  Medicine Team Team phone # [phone]  Thank you for allowing the Palliative Medicine Team to assist in the care of this patient. Please utilize secure chat with additional questions, if there is no response within 30 minutes please call the above phone number.  Palliative Medicine Team providers are available by phone from 7am to 7pm daily and can be reached through the team cell phone.  Should this patient require assistance outside of these hours, please call the patient's attending physician.

## 2023-07-02 NOTE — ED Notes (Signed)
ED TO INPATIENT HANDOFF REPORT  ED Nurse Name and Phone #: [phone]  S Name/Age/Gender Phillip Young 37 y.o. male Room/Bed: 038C/038C  Code Status   Code Status: Full Code  Home/SNF/Other Home Patient oriented to: self, place, time, and situation Is this baseline? Yes   Triage Complete: Triage complete  Chief Complaint Hyperkalemia [E87.5]  Triage Note Pt from home c/o 2 missed dialysis apts. States transport company are refusing to pick him up as his apartment is difficult to access. States SOB when lying flat but not unusual.    Allergies 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, SRA positive   Metoprolol Other (See Comments)    near syncope   Amoxicillin Other (See Comments)    Was hospitalized    Other Swelling and Other (See Comments)    Steroids Fluid seeping out of legs     Level of Care/Admitting Diagnosis ED Disposition     ED Disposition  Admit   Condition  --   Comment  Hospital Area: [facility] [[id number]]  Level of Care: Progressive [102]  Admit to Progressive based on following criteria: NEPHROLOGY stable condition requiring close monitoring for AKI, requiring Hemodialysis or Peritoneal Dialysis either from expected electrolyte imbalance, acidosis, or fluid overload that can be managed by NIPPV or high flow oxygen.  May admit patient to [facility] or [facility] if equivalent level of care is available:: No  Covid Evaluation: Asymptomatic - no recent exposure (last 10 days) testing not required  Diagnosis: Hyperkalemia [172194]  Admitting Physician: Mercie Eon [[id number]]  Attending Physician: Mercie Eon [[id number]]  Certification:: I certify this patient will need inpatient services for at least 2 midnights  Expected  Medical Readiness: 07/05/2023          B Medical/Surgery History Past Medical History:  Diagnosis Date   Acute on chronic respiratory failure with hypoxia ([name]) 04/21/2021   Acute on chronic systolic (congestive) heart failure ([name]) 02/26/2020   Amiodarone toxicity    Anemia    Atrial flutter ([name])    Biventricular congestive heart failure ([name])    Chronic hypoxemic respiratory failure ([name])    Class 3 severe obesity due to excess calories with serious comorbidity and body mass index (BMI) of 50.0 to 59.9 in adult ([name]) 02/26/2020   Essential hypertension 02/26/2020   GERD without esophagitis 02/26/2020   Hidradenitis suppurativa 02/26/2020   NICM (nonischemic cardiomyopathy) ([name])    Obesity hypoventilation syndrome ([name])    OSA (obstructive sleep apnea)    PAF (paroxysmal atrial fibrillation) ([name])    Pneumonia    Prediabetes 02/26/2020   Past Surgical History:  Procedure Laterality Date   ABSCESS DRAINAGE     AV FISTULA PLACEMENT Left 08/21/2021   Procedure: LEFT ARM ARTERIOVENOUS (AV) FISTULA.;  Surgeon: Nada Libman, MD;  Location: [facility] OR;  Service: Vascular;  Laterality: Left;   CARDIAC CATHETERIZATION     CARDIOVERSION N/A 10/09/2021   Procedure: CARDIOVERSION;  Surgeon: Laurey Morale, MD;  Location: [facility] ENDOSCOPY;  Service: Cardiovascular;  Laterality: N/A;   CARDIOVERSION N/A 05/28/2022   Procedure: CARDIOVERSION;  Surgeon: Laurey Morale, MD;  Location: [facility] ENDOSCOPY;  Service: Cardiovascular;  Laterality: N/A;   CARDIOVERSION N/A 06/07/2022   Procedure: CARDIOVERSION;  Surgeon: Laurey Morale, MD;  Location: [facility] ENDOSCOPY;  Service: Cardiovascular;  Laterality: N/A;   IR FLUORO GUIDE CV LINE RIGHT  03/10/2021   IR FLUORO GUIDE CV LINE RIGHT  04/22/2021   IR FLUORO GUIDE CV LINE RIGHT  08/20/2021   IR FLUORO GUIDE CV LINE RIGHT  03/01/2023   IR FLUORO GUIDE CV LINE RIGHT  03/09/2023   IR FLUORO GUIDE CV LINE RIGHT  05/30/2023   IR PTA VENOUS EXCEPT DIALYSIS  CIRCUIT  03/09/2023   IR REMOVAL TUN CV CATH W/O FL  05/26/2023   IR REMOVE CV FIBRIN SHEATH  03/09/2023   IR US GUIDE VASC ACCESS RIGHT  03/10/2021   IR US GUIDE VASC ACCESS RIGHT  04/22/2021   IR US GUIDE VASC ACCESS RIGHT  05/30/2023   RIGHT HEART CATH N/A 03/06/2021   Procedure: RIGHT HEART CATH;  Surgeon: Dolores Patty, MD;  Location: [facility] INVASIVE CV LAB;  Service: Cardiovascular;  Laterality: N/A;   RIGHT/LEFT HEART CATH AND CORONARY ANGIOGRAPHY N/A 03/04/2020   Procedure: RIGHT/LEFT HEART CATH AND CORONARY ANGIOGRAPHY;  Surgeon: Laurey Morale, MD;  Location[facility] INVASIVE CV LAB;  Service: Cardiovascular;  Laterality: N/A;   TEE WITHOUT CARDIOVERSION N/A 05/05/2021   Procedure: TRANSESOPHAGEAL ECHOCARDIOGRAM (TEE);  Surgeon: Laurey Morale, MD;  Location: [facility] ENDOSCOPY;  Service: Cardiovascular;  Laterality: N/A;   TEMPORARY DIALYSIS CATHETER  03/06/2021   Procedure: TEMPORARY DIALYSIS CATHETER;  Surgeon: Dolores Patty, MD;  Location: [facility] INVASIVE CV LAB;  Service: Cardiovascular;;     A IV Location/Drains/Wounds Patient Lines/Drains/Airways Status     Active Line/Drains/Airways     Name Placement date Placement time Site Days   Peripheral IV 07/01/23 20 G Anterior;Distal;Left;Upper Arm 07/01/23  2230  Arm  1   Hemodialysis Catheter Right Internal jugular Double lumen Permanent (Tunneled) 05/30/23  0856  Internal jugular  33   Pressure Injury 05/18/23 Buttocks Right Stage 3 -  Full thickness tissue loss. Subcutaneous fat may be visible but bone, tendon or muscle are NOT exposed. 05/18/23  2300  -- 45   Pressure Injury 05/18/23 Buttocks Left Stage 2 -  Partial thickness loss of dermis presenting as a shallow open injury with a red, pink wound bed without slough. 05/18/23  2300  -- 45            Intake/Output Last 24 hours  Intake/Output Summary (Last 24 hours) at 07/02/2023 1502 Last data filed at 07/02/2023 1106 Gross per 24 hour  Intake 94.84 ml  Output 3500 ml   Net -3405.16 ml    Labs/Imaging Results for orders placed or performed during the hospital encounter of 07/01/23 (from the past 48 hour(s))  Comprehensive metabolic panel     Status: Abnormal   Collection Time: 07/01/23 10:08 PM  Result Value Ref Range   Sodium 138 135 - 145 mmol/L   Potassium 7.5 (HH) 3.5 - 5.1 mmol/L    Comment: CRITICAL RESULT CALLED TO, READ BACK BY AND VERIFIED WITH TURNBOW, JULIA RN @2249  07/01/23 SATRAINR REPEATED TO VERIFY    Chloride 99 98 - 111 mmol/L   CO2 22 22 - 32 mmol/L   Glucose, Bld 85 70 - 99 mg/dL    Comment: Glucose reference range applies only to samples taken after fasting for at least 8 hours.   BUN 68 (H) 6 - 20 mg/dL   Creatinine, Ser 78.29 (H) 0.61 - 1.24 mg/dL   Calcium 56.2 8.9 - 13.0 mg/dL   Total Protein 7.0 6.5 - 8.1 g/dL  Albumin 3.1 (L) 3.5 - 5.0 g/dL   AST 28 15 - 41 U/L   ALT 6 0 - 44 U/L   Alkaline Phosphatase 87 38 - 126 U/L   Total Bilirubin 1.0 <1.2 mg/dL   GFR, Estimated 4 (L) >60 mL/min    Comment: (NOTE) Calculated using the CKD-EPI Creatinine Equation (2021)    Anion gap 17 (H) 5 - 15    Comment: Performed at [facility], [address] [place], [place] [address]  CBC with Differential     Status: Abnormal   Collection Time: 07/01/23 10:08 PM  Result Value Ref Range   WBC 5.9 4.0 - 10.5 K/uL   RBC 2.95 (L) 4.22 - 5.81 MIL/uL   Hemoglobin 9.3 (L) 13.0 - 17.0 g/dL   HCT 84.1 (L) 32.4 - 40.1 %   MCV 100.7 (H) 80.0 - 100.0 fL   MCH 31.5 26.0 - 34.0 pg   MCHC 31.3 30.0 - 36.0 g/dL   RDW 02.7 25.3 - 66.4 %   Platelets 211 150 - 400 K/uL   nRBC 0.0 0.0 - 0.2 %   Neutrophils Relative % 71 %   Neutro Abs 4.1 1.7 - 7.7 K/uL   Lymphocytes Relative 15 %   Lymphs Abs 0.9 0.7 - 4.0 K/uL   Monocytes Relative 7 %   Monocytes Absolute 0.4 0.1 - 1.0 K/uL   Eosinophils Relative 6 %   Eosinophils Absolute 0.3 0.0 - 0.5 K/uL   Basophils Relative 1 %   Basophils Absolute 0.1 0.0 - 0.1 K/uL   Immature Granulocytes 0  %   Abs Immature Granulocytes 0.01 0.00 - 0.07 K/uL    Comment: Performed at [name], [address], [place], [place] [address]  Magnesium     Status: None   Collection Time: 07/01/23 10:08 PM  Result Value Ref Range   Magnesium 2.0 1.7 - 2.4 mg/dL    Comment: Performed at [facility], [address], [place], [place] [address]  Phosphorus     Status: Abnormal   Collection Time: 07/01/23 10:08 PM  Result Value Ref Range   Phosphorus 9.3 (H) 2.5 - 4.6 mg/dL    Comment: Performed at [facility], [address], [place], [place] [address]  Brain natriuretic peptide     Status: Abnormal   Collection Time: 07/01/23 10:08 PM  Result Value Ref Range   B Natriuretic Peptide 786.2 (H) 0.0 - 100.0 pg/mL    Comment: Performed at [facility], [address] [place], [place] [address]  CBG monitoring, ED     Status: Abnormal   Collection Time: 07/02/23 12:05 AM  Result Value Ref Range   Glucose-Capillary 39 (LL) 70 - 99 mg/dL    Comment: Glucose reference range applies only to samples taken after fasting for at least 8 hours.   Comment 1 Notify RN   CBG monitoring, ED     Status: None   Collection Time: 07/02/23 12:48 AM  Result Value Ref Range   Glucose-Capillary 79 70 - 99 mg/dL    Comment: Glucose reference range applies only to samples taken after fasting for at least 8 hours.  CBG monitoring, ED     Status: Abnormal   Collection Time: 07/02/23  1:35 AM  Result Value Ref Range   Glucose-Capillary 103 (H) 70 - 99 mg/dL    Comment: Glucose reference range applies only to samples taken after fasting for at least 8 hours.  Hepatitis B surface antigen     Status: None   Collection Time: 07/02/23  6:27 AM  Result Value Ref Range   Hepatitis B Surface Ag NON REACTIVE NON REACTIVE    Comment: Performed at [facility], [address], [place], [place] [address]  HIV Antibody (routine testing w rflx)     Status: None   Collection Time: 07/02/23  6:27 AM   Result Value Ref Range   HIV Screen 4th Generation wRfx Non Reactive Non Reactive    Comment: Performed at [facility], [address], [place], [place] [address]  CBC     Status: Abnormal   Collection Time: 07/02/23  6:27 AM  Result Value Ref Range   WBC 4.9 4.0 - 10.5 K/uL   RBC 2.81 (L) 4.22 - 5.81 MIL/uL   Hemoglobin 9.0 (L) 13.0 - 17.0 g/dL   HCT 09.8 (L) 11.9 - 14.7 %   MCV 99.3 80.0 - 100.0 fL   MCH 32.0 26.0 - 34.0 pg   MCHC 32.3 30.0 - 36.0 g/dL   RDW 82.9 56.2 - 13.0 %   Platelets 217 150 - 400 K/uL   nRBC 0.0 0.0 - 0.2 %    Comment: Performed at [facility], [address] [place], [place] [address]  Renal function panel     Status: Abnormal   Collection Time: 07/02/23  6:37 AM  Result Value Ref Range   Sodium 136 135 - 145 mmol/L   Potassium 5.0 3.5 - 5.1 mmol/L   Chloride 98 98 - 111 mmol/L   CO2 25 22 - 32 mmol/L   Glucose, Bld 72 70 - 99 mg/dL    Comment: Glucose reference range applies only to samples taken after fasting for at least 8 hours.   BUN 38 (H) 6 - 20 mg/dL   Creatinine, Ser 4.69 (H) 0.61 - 1.24 mg/dL   Calcium 9.4 8.9 - 62.9 mg/dL   Phosphorus 5.7 (H) 2.5 - 4.6 mg/dL   Albumin 3.3 (L) 3.5 - 5.0 g/dL   GFR, Estimated 8 (L) >60 mL/min    Comment: (NOTE) Calculated using the CKD-EPI Creatinine Equation (2021)    Anion gap 13 5 - 15    Comment: Performed at [facility], [address] [place], [place] [address]   DG Chest Portable 1 View  Result Date: 07/01/2023 CLINICAL DATA:  End-stage renal disease, missed dialysis EXAM: PORTABLE CHEST 1 VIEW COMPARISON:  06/15/2023 FINDINGS: 2 frontal views of the chest demonstrate right internal jugular dialysis catheter unchanged. Stable enlarged cardiac silhouette. No airspace disease, effusion, or pneumothorax. No acute bony abnormalities. IMPRESSION: 1. Stable enlarged cardiac silhouette.  No acute airspace disease. Electronically Signed   By: Sharlet Salina M.D.   On: 07/01/2023 22:59    CT PELVIS WO CONTRAST  Result Date: 07/01/2023 CLINICAL DATA:  History of chronic sacral wounds assess for deep bony involvement EXAM: CT PELVIS WITHOUT CONTRAST TECHNIQUE: Multidetector CT imaging of the pelvis was performed following the standard protocol without intravenous contrast. RADIATION DOSE REDUCTION: This exam was performed according to the departmental dose-optimization program which includes automated exposure control, adjustment of the mA and/or kV according to patient size and/or use of iterative reconstruction technique. COMPARISON:  04/19/2021 FINDINGS: Urinary Tract: Visualized portions of the right kidney are within normal limits. No obstructive changes are seen. The bladder is decompressed. Bowel: Mild diverticular change of the colon is noted. No evidence of diverticulitis is seen. The  appendix is within normal limits. Visualized small bowel is unremarkable. Vascular/Lymphatic: Mild atherosclerotic calcifications of the iliac vessels are seen. No lymphadenopathy is noted. Reproductive:  Prostate is within normal limits. Other:  None. Musculoskeletal: There are significant inflammatory changes identified posterior to the sacrum in the subcutaneous fatty tissue without discrete abscess identified. Focal defect is noted just to the right of the midline adjacent to the inflammatory change which may represent a focal decubitus ulcer. No definitive bony involvement is seen. No extension to the bony structures of the sacrum is identified. IMPRESSION: Inflammatory changes in the subcutaneous tissues over the sacrum consistent with the given clinical history. No discrete abscess is noted. A questionable decubitus ulcer is noted to the right of the midline. No discrete bony involvement is noted. Electronically Signed   By: Alcide Clever M.D.   On: 07/01/2023 22:55    Pending Labs Unresulted Labs (From admission, onward)     Start     Ordered   07/03/23 0500  Renal function panel  Tomorrow  morning,   R        07/02/23 1241   07/03/23 0500  CBC  Tomorrow morning,   R        07/02/23 1241   07/01/23 2307  Hepatitis B surface antibody,quantitative  (New Admission Hemo Labs (Hepatitis B))  Once,   URGENT        07/01/23 2308   Signed and Held  Renal function panel  Once,   R        Signed and Held   Signed and Held  CBC  Once,   R        Signed and Held            Vitals/Pain Today's Vitals   07/02/23 0700 07/02/23 0730 07/02/23 0801 07/02/23 1205  BP: 104/60 (!) 99/55 (!) 93/56 115/70  Pulse: 90 90 95 92  Resp: 20 20 18 19   Temp:   98 F (36.7 C) 98 F (36.7 C)  TempSrc:    Oral  SpO2: 100% 100% 100% 100%  Weight:      Height:      PainSc:        Isolation Precautions No active isolations  Medications Medications  Chlorhexidine Gluconate Cloth 2 % PADS 6 each (has no administration in time range)  dextrose 50 % solution (  Not Given 07/02/23 0027)  apixaban (ELIQUIS) tablet 5 mg (5 mg Oral Given 07/02/23 0926)  digoxin (LANOXIN) tablet 0.125 mg (has no administration in time range)  levothyroxine (SYNTHROID) tablet 25 mcg (25 mcg Oral Given 07/02/23 0926)  midodrine (PROAMATINE) tablet 30 mg (30 mg Oral Given 07/02/23 0935)  ondansetron (ZOFRAN) tablet 4 mg (has no administration in time range)  acetaminophen (TYLENOL) tablet 1,000 mg (has no administration in time range)  pregabalin (LYRICA) capsule 50 mg (50 mg Oral Given 07/02/23 0926)  anticoagulant sodium citrate solution 5 mL (has no administration in time range)  ceFAZolin (ANCEF) IVPB 2g/100 mL premix (has no administration in time range)  lanthanum (FOSRENOL) chewable tablet 1,000 mg (has no administration in time range)  fentaNYL (DURAGESIC) 12 MCG/HR 1 patch (1 patch Transdermal Patch Applied 07/02/23 1404)  oxyCODONE (Oxy IR/ROXICODONE) immediate release tablet 10 mg (has no administration in time range)  insulin aspart (novoLOG) injection 5 Units (5 Units Intravenous Given 07/01/23 2300)    And   dextrose 50 % solution 50 mL (50 mLs Intravenous Given 07/01/23 2259)  sodium zirconium cyclosilicate (LOKELMA) packet  10 g (10 g Oral Given 07/01/23 2300)  dextrose 50 % solution 50 mL (50 mLs Intravenous Given 07/02/23 0009)  albumin human 25 % solution 25 g (25 g Intravenous New Bag/Given 07/02/23 0239)  ceFAZolin (ANCEF) IVPB 2g/100 mL premix (0 g Intravenous Stopped 07/02/23 1106)    Mobility Unable to walk because of Neuropathy in foot     Focused Assessments Pulmonary Assessment Handoff:  Lung sounds: Bilateral Breath Sounds: Clear L Breath Sounds: Clear R Breath Sounds: Clear O2 Device: Room Air O2 Flow Rate (L/min): 3 L/min    R Recommendations: See Admitting Provider Note  Report given to:   Additional Notes: went to HD took off 3.5 liters off

## 2023-07-02 NOTE — ED Notes (Signed)
CBG 39  RN Notified. Pt given two orange juice cups, peanut butter, and crackers. Will recheck CBG when pt finishes snacks.

## 2023-07-02 NOTE — Evaluation (Signed)
Physical Therapy Evaluation Patient Details Name: Phillip Young MRN: [id number] DOB: [birth year] Today's Date: 07/02/2023  History of Present Illness  Pt is a 37 y.o. male who presented 07/01/23 with increasing lower extremity pain and is admitted for hyperkalemia. PMH PMH: morbid obesity, HTN, prediabetes, OSA on home BiPAP, obesity hypoventilation syndrome, chronic systolic heart failure due to nonischemic cardiomyopathy, dialysis dependent ESRD, and paroxysmal atrial fibrillation/flutter   Clinical Impression  Pt presents with condition above and deficits mentioned below, see PT Problem List. PTA, pt was residing with his mother in a 1-level apartment with either pt having to navigate down x15-20 stairs to access or go down a hill to access via a level entry. He has been having difficulty getting back up the hill to go to HD, even with assistance while he sits in a w/c. Currently, pt is limited greatly by his bil lower leg and feet pain. He is hypersensitive to touch there and was unable to tolerate placing his feet on the ground to stand today. He required modA for bed mobility. Pt demonstrates deficits in sensation, balance, activity tolerance, and strength. The pt was very emotional in regards to his pain and expressed concern that his mother is mad at him for talking to Palliative Care and that his mother would not want him to give up but he made comments like "I just want to die". I asked if we should be concerned about him harming himself and he said "If I wanted to do that shit I have handguns at home to get it done". I asked again if he was wanting to act on it and he stated "No I can't do that". And then he mentioned maybe wanting to stop HD. Notified RN and MD. Pt did express that he really enjoyed going to [facility] and did well with it last time, thus recommending AIR at this time. Will continue to follow acutely.      If plan is discharge home, recommend the following: Two people to help with  walking and/or transfers;Two people to help with bathing/dressing/bathroom;Assistance with cooking/housework;Assist for transportation;Help with stairs or ramp for entrance   Can travel by private vehicle        Equipment Recommendations Wheelchair (measurements PT);Wheelchair cushion (measurements PT);Hospital bed;Hoyer lift (electric w/c - bariatric)  Recommendations for Other Services  Rehab consult    Functional Status Assessment Patient has had a recent decline in their functional status and demonstrates the ability to make significant improvements in function in a reasonable and predictable amount of time.     Precautions / Restrictions Precautions Precautions: Fall;Other (comment) Precaution Comments: watch SpO2; sensitive to touch in lower legs bil Restrictions Weight Bearing Restrictions: No      Mobility  Bed Mobility Overal bed mobility: Needs Assistance Bed Mobility: Rolling, Supine to Sit, Sit to Supine Rolling: Mod assist, Used rails   Supine to sit: Mod assist, HOB elevated, Used rails Sit to supine: Mod assist, HOB elevated, Used rails   General bed mobility comments: Pt required modA to rotate hips to roll using bed rail to pull on. ModA needed to swing legs onto/off the edge of the stretcher for supine <> sit. Pt also needed assist at trunk to sit up    Transfers                   General transfer comment: Pt unable to touch feet to ground due to extreme pain with touch at bil feet this date, thus unable to attempt  to stand today    Ambulation/Gait         Gait velocity: Pt unable to touch feet to ground due to extreme pain with touch at bil feet this date, thus unable to attempt to stand and ambulate today        Stairs            Wheelchair Mobility     Tilt Bed    Modified Rankin (Stroke Patients Only)       Balance Overall balance assessment: Needs assistance Sitting-balance support: Single extremity supported,  Bilateral upper extremity supported, Feet unsupported Sitting balance-Leahy Scale: Poor Sitting balance - Comments: CGA for static sitting balance with UE support and pt unable to touch feet to ground due to extreme pain with touch at bil feet this date       Standing balance comment: Pt unable to touch feet to ground due to extreme pain with touch at bil feet this date, thus unable to attempt to stand today                             Pertinent Vitals/Pain Pain Assessment Pain Assessment: Faces Faces Pain Scale: Hurts worst Pain Location: bil lower legs and feet Pain Descriptors / Indicators: Discomfort, Grimacing, Guarding, Crying, Moaning, Other (Comment) (electricity) Pain Intervention(s): Limited activity within patient's tolerance, Monitored during session, Repositioned    Home Living Family/patient expects to be discharged to:: Private residence Living Arrangements: Parent (mother) Available Help at Discharge: Family;Available 24 hours/day Type of Home: Apartment Home Access: Stairs to enter;Level entry   Entrance Stairs-Number of Steps: 15-20 to go downstairs to his basement apartment, or can go down a hill to the back for a level entrance   Home Layout: One level Home Equipment: Wheelchair - Forensic psychologist (2 wheels);BSC/3in1      Prior Function Prior Level of Function : Independent/Modified Independent             Mobility Comments: Per chart, prior to admission in June 2024 pt was independent with mobility. Pt reports he has been mod I for household distance gait bouts using the RW recently ADLs Comments: sponge baths 2/2 dialysis port, started using bed pan at home when he started missing dialysis & BLE began to swell per prior entry     Extremity/Trunk Assessment   Upper Extremity Assessment Upper Extremity Assessment: Defer to OT evaluation    Lower Extremity Assessment Lower Extremity Assessment: Generalized weakness;RLE  deficits/detail;LLE deficits/detail RLE Deficits / Details: hypersensitive to touch at lower leg and foot LLE Deficits / Details: hypersensitive to touch at lower leg and foot    Cervical / Trunk Assessment Cervical / Trunk Assessment: Other exceptions Cervical / Trunk Exceptions: increased body habitus  Communication   Communication Communication: Other (comment) (soft spoken)  Cognition Arousal: Alert Behavior During Therapy: Lability Overall Cognitive Status: Within Functional Limits for tasks assessed                                 General Comments: Pt very emotional in regards to his pain and expressed concern that his mother is mad at him for talking to Palliative Care and that his mother would not want him to give up but he made comments like "I just want to die". I asked if we should be concerned about him harming himself and he said "If I wanted to do  that shit I have handguns at home to get it done". I asked again if he was wanting to act on it and he stated "No I can't do that". And then he mentioned maybe wanting to stop HD. Notified RN and MD.        General Comments      Exercises     Assessment/Plan    PT Assessment Patient needs continued PT services  PT Problem List Decreased strength;Decreased activity tolerance;Decreased balance;Decreased mobility;Cardiopulmonary status limiting activity;Impaired sensation;Obesity;Pain       PT Treatment Interventions DME instruction;Gait training;Stair training;Functional mobility training;Therapeutic activities;Therapeutic exercise;Balance training;Neuromuscular re-education;Patient/family education;Wheelchair mobility training    PT Goals (Current goals can be found in the Care Plan section)  Acute Rehab PT Goals Patient Stated Goal: to reduce pain PT Goal Formulation: With patient Time For Goal Achievement: 07/16/23 Potential to Achieve Goals: Fair    Frequency Min 1X/week     Co-evaluation                AM-PAC PT "6 Clicks" Mobility  Outcome Measure Help needed turning from your back to your side while in a flat bed without using bedrails?: A Lot Help needed moving from lying on your back to sitting on the side of a flat bed without using bedrails?: A Lot Help needed moving to and from a bed to a chair (including a wheelchair)?: Total Help needed standing up from a chair using your arms (e.g., wheelchair or bedside chair)?: Total Help needed to walk in hospital room?: Total Help needed climbing 3-5 steps with a railing? : Total 6 Click Score: 8    End of Session Equipment Utilized During Treatment: Oxygen Activity Tolerance: Patient limited by pain Patient left: in bed;with call bell/phone within reach Nurse Communication: Mobility status;Other (comment) (pt making comments about wanting to die - notified MD as well) PT Visit Diagnosis: Unsteadiness on feet (R26.81);Muscle weakness (generalized) (M62.81);Difficulty in walking, not elsewhere classified (R26.2);Pain Pain - Right/Left:  (bil) Pain - part of body: Leg;Ankle and joints of foot    Time: 1610-9604 PT Time Calculation (min) (ACUTE ONLY): 26 min   Charges:   PT Evaluation $PT Eval Moderate Complexity: 1 Mod PT Treatments $Therapeutic Activity: 8-22 mins PT General Charges $$ ACUTE PT VISIT: 1 Visit         Virgil Benedict, PT, DPT Acute Rehabilitation Services  Office: [phone]   Bettina Gavia 07/02/2023, 4:59 PM

## 2023-07-02 NOTE — Progress Notes (Signed)
   07/02/23 0730  Vitals  BP (!) 99/55  BP Location Right Arm  BP Method Automatic  Patient Position (if appropriate) Lying  Pulse Rate 90  Pulse Rate Source Monitor  ECG Heart Rate 90  Resp 20  Oxygen Therapy  SpO2 100 %  O2 Device Room Air  O2 Flow Rate (L/min) 3 L/min  During Treatment Monitoring  Blood Flow Rate (mL/min) 0 mL/min  Arterial Pressure (mmHg) -0.8 mmHg  Venous Pressure (mmHg) -2.02 mmHg  TMP (mmHg) -49.89 mmHg  Ultrafiltration Rate (mL/min) 1187 mL/min  Dialysate Flow Rate (mL/min) 300 ml/min  Duration of HD Treatment -hour(s) 3.5 hour(s)  Cumulative Fluid Removed (mL) per Treatment  3500.23  HD Safety Checks Performed Yes  Intra-Hemodialysis Comments Tx completed  Post Treatment  Dialyzer Clearance Lightly streaked  Liters Processed 84  Fluid Removed (mL) 3500 mL  Tolerated HD Treatment Yes  Hemodialysis Catheter Right Internal jugular Double lumen Permanent (Tunneled)  Placement Date/Time: 05/30/23 0856   Serial / Lot #: [id number]  Expiration Date: 09/23/27  Time Out: Correct patient;Correct site;Correct procedure  Maximum sterile barrier precautions: Hand hygiene;Cap;Mask;Sterile gown;Sterile gloves;Large sterile s...  Site Condition No complications  Catheter fill solution 4% Sodium Citrate  Catheter fill volume (Arterial) 2 cc  Catheter fill volume (Venous) 2  Dressing Type Transparent  Dressing Status Clean, Dry, Intact;Antimicrobial disc in place  Interventions New dressing  Post treatment catheter status Capped and Clamped   Pt tolerated treatment without complications

## 2023-07-02 NOTE — Progress Notes (Signed)
Patient was transported from the ED on 3L oxygen. Orientated to unit, vitals done and charted. Bed in the lowest position, call bell within reach. Order commenced, patient comfortable in bed.

## 2023-07-02 NOTE — Progress Notes (Signed)
HD#0 Subjective:   Summary: Phillip Young is a 37 y.o. male with pertinent PMH of ESRD on HD MWF, chronic hypoxic respiratory failure 2/2 suspected amiodarone toxicity, persistent atrial fibrillation, HFrEF who presented with increasing pain and is admitted for hyperkalemia.   Overnight Events: Received dialysis  This morning the patient is still in significant pain predominantly in his lower extremities below the knees.  He expresses a lot of frustration and difficulty dealing with all of his chronic problems.  His most important problem is his chronic lower extremity pain which is been present since his previous admission where he was on a ventilator for several weeks.  His mother was on the phone during our conversation.  We discussed our plan to address his plan which is outlined below as well as his mother's concerns for parasitic infection.  Objective:  Vital signs in last 24 hours: Vitals:   07/02/23 0400 07/02/23 0430 07/02/23 0500 07/02/23 0530  BP: 108/74 114/65 118/70 99/70  Pulse: 88 88 84   Resp: 20 20 20 20   Temp:      TempSrc: Oral     SpO2: 100% 100% 100% 100%  Weight:      Height:       Supplemental O2: Nasal Cannula SpO2: 100 % O2 Flow Rate (L/min): 3 L/min   Physical Exam:  Constitutional: Chronically ill-appearing obese male, appears uncomfortable and tired Cardiovascular: Irregularly irregular rhythm with normal rate Pulmonary/Chest:no significant crackles or focal lung sounds on anterior and lateral lung fields MSK: Pitting edema in the lower extremities bilaterally and significant pain to palpation in bilateral lower extremities below the knees without overlying skin changes, wounds, or other abnormalities Neurological: alert & oriented x 3,   Filed Weights   07/01/23 2138  Weight: 108 kg     No intake or output data in the 24 hours ending 07/02/23 0604 Net IO Since Admission: No IO data has been entered for this period [07/02/23  0604]  Pertinent Labs:    Latest Ref Rng & Units 07/01/2023   10:08 PM 06/21/2023    4:06 PM 06/19/2023   10:45 AM  CBC  WBC 4.0 - 10.5 K/uL 5.9  6.7  5.5   Hemoglobin 13.0 - 17.0 g/dL 9.3  40.9  81.1   Hematocrit 39.0 - 52.0 % 29.7  36.4  36.3   Platelets 150 - 400 K/uL 211  230  222        Latest Ref Rng & Units 07/01/2023   10:08 PM 06/21/2023    4:06 PM 06/19/2023   10:45 AM  CMP  Glucose 70 - 99 mg/dL 85  96  914   BUN 6 - 20 mg/dL 68  41  34   Creatinine 0.61 - 1.24 mg/dL 78.29  5.62  1.30   Sodium 135 - 145 mmol/L 138  134  136   Potassium 3.5 - 5.1 mmol/L 7.5  6.1  5.0   Chloride 98 - 111 mmol/L 99  93  98   CO2 22 - 32 mmol/L 22  19  21    Calcium 8.9 - 10.3 mg/dL 86.5  78.4  69.6   Total Protein 6.5 - 8.1 g/dL 7.0     Total Bilirubin <1.2 mg/dL 1.0     Alkaline Phos 38 - 126 U/L 87     AST 15 - 41 U/L 28     ALT 0 - 44 U/L 6       Assessment/Plan:   Principal Problem:  Hyperkalemia   Patient Summary: Phillip Young is a 37 y.o. male with pertinent PMH of ESRD on HD MWF, chronic hypoxic respiratory failure 2/2 suspected amiodarone toxicity, persistent atrial fibrillation, HFrEF who presented with bothersome lower extremity pain and is admitted for hyperkalemia, on hospital day 0.   Hyperkalemia Hyperphosphatemia ESRD on HD MWF Received dialysis overnight and we will recheck a metabolic panel this afternoon.  As below we will continue to work on his pain and ensure that you are using medications with the lowest risk of buildup in patient with renal disease.  Unfortunately his biggest hurdle to consistent outpatient dialysis is transportation.  He lives in the basement floor of an apartment with his mother and either has to walk up a hill or walk upstairs to get a transport.  He gets transport through [place] usually.  We will work with Child psychotherapist on potential fixes for this. - Appreciate nephrology's help and HD overnight - Repeat renal function panel this  afternoon  Compressive neuropathy Lower extremity pain Pain continues to be the most bothersome issue and he has responded somewhat to oxycodone 5 mg outpatient.  Unfortunately this is no longer sufficient.  He is on roughly 20-25 morphine milliequivalents outpatient and a fentanyl 12.5 mcg patch would be roughly 30 morphine milliequivalents daily.  Since our clinic was planning to increase his oxycodone outpatient I think this is a good start to give good baseline pain control. - Fentanyl 12.5 mcg patch, oxycodone 10 mg every 6 hours as needed, continue home pregabalin  Chronic/resolved issues Permanent A-fib HFrEF History of MSSA bacteremia Hypertension Chronic hypoxic respiratory failure 2/2 amiodarone toxicity Anemia of chronic disease Macrocytic anemia Hyperglycemia Decubitus ulcers -For these issues we will continue his home medications and monitor blood pressure, oxygen requirements, blood counts, CBG, and frequent wound care  Diet: Renal IVF: None VTE: DOAC Code: Full PT/OT recs: Pending.  Dispo: Anticipated discharge to Home in 1-2 days pending continued improvement after dialysis and ensured good transport plan for dialysis outpatient.   Rocky Morel, DO Internal Medicine Resident PGY-2 Pager: [phone]  Please contact the on call pager after 5 pm and on weekends at [phone].

## 2023-07-02 NOTE — ED Notes (Signed)
Pt went to dialysis at 0305 12/7. All belongings went with patient

## 2023-07-02 NOTE — Progress Notes (Signed)
[facility] NEPHROLOGY PROGRESS NOTE  Assessment/ Plan: Pt is a 37 y.o. yo male   OP HD orders:  [facility], MWF, 4.5 hr, 2 k2 ca, edw 155.5kg,  [facility] TDC, last OP HD on 12/2.  Cefazolin 2g every HD till 07/04/23.  # Severe hyperkalemia in the setting of missing dialysis: Status post urgent HD last night, follow labs.  # ESRD MWF: Received HD with 3.5 L UF, blood pressure soft this morning.  IJ for the access.  # Anemia of ESRD: Monitor hemoglobin, erythropoietin with HD.  # CKD-MBD/hyperphosphatemia: Resume lanthanum and monitor lab.  # Hypotension/volume: On midodrine, blood pressure soft.  # MSSA bacteremia: Currently on cefazolin 2 g daily 12/9.   Subjective: Seen and examined in ER.  Patient reported generalized body pain and wanted pain medication.  Blood pressure running soft.  On oxygen.  Discussed with the ER nurse. Objective Vital signs in last 24 hours: Vitals:   07/02/23 0630 07/02/23 0700 07/02/23 0730 07/02/23 0801  BP: 118/68 104/60 (!) 99/55 (!) 93/56  Pulse: 91 90 90 95  Resp: 20 20 20 18   Temp:    98 F (36.7 C)  TempSrc:      SpO2: 100% 100% 100% 100%  Weight:      Height:       Weight change:   Intake/Output Summary (Last 24 hours) at 07/02/2023 1108 Last data filed at 07/02/2023 1106 Gross per 24 hour  Intake 94.84 ml  Output 3500 ml  Net -3405.16 ml       Labs: RENAL PANEL Recent Labs  Lab 07/01/23 2208  NA 138  K 7.5*  CL 99  CO2 22  GLUCOSE 85  BUN 68*  CREATININE 13.56*  CALCIUM 10.2  MG 2.0  PHOS 9.3*  ALBUMIN 3.1*    Liver Function Tests: Recent Labs  Lab 07/01/23 2208  AST 28  ALT 6  ALKPHOS 87  BILITOT 1.0  PROT 7.0  ALBUMIN 3.1*   No results for input(s): "LIPASE", "AMYLASE" in the last 168 hours. No results for input(s): "AMMONIA" in the last 168 hours. CBC: Recent Labs    08/09/22 0042 08/10/22 0107 02/21/23 1538 02/22/23 0209 04/14/23 0314 04/15/23 1054 05/19/23 2020 05/20/23 1422  06/18/23 0513 06/19/23 1045 06/21/23 1606 07/01/23 2208 07/02/23 0627  HGB 6.7*   < >  --    < >  --    < >  --    < > 9.6* 11.3* 11.7* 9.3* 9.0*  MCV 106.1*   < >  --    < >  --    < >  --    < > 100.0 100.6* 97 100.7* 99.3  VITAMINB12  --   --   --   --  1,014*  --   --   --   --   --   --   --   --   FOLATE  --   --  12.0  --   --   --   --   --   --   --   --   --   --   FERRITIN 156  --   --   --   --   --  359*  --   --   --   --   --   --   TIBC 330  --   --   --   --   --  211*  --   --   --   --   --   --  IRON 28*  --   --   --   --   --  64  --   --   --   --   --   --    < > = values in this interval not displayed.    Cardiac Enzymes: No results for input(s): "CKTOTAL", "CKMB", "CKMBINDEX", "TROPONINI" in the last 168 hours. CBG: Recent Labs  Lab 07/02/23 0005 07/02/23 0048 07/02/23 0135  GLUCAP 39* 79 103*    Iron Studies: No results for input(s): "IRON", "TIBC", "TRANSFERRIN", "FERRITIN" in the last 72 hours. Studies/Results: DG Chest Portable 1 View  Result Date: 07/01/2023 CLINICAL DATA:  End-stage renal disease, missed dialysis EXAM: PORTABLE CHEST 1 VIEW COMPARISON:  06/15/2023 FINDINGS: 2 frontal views of the chest demonstrate right internal jugular dialysis catheter unchanged. Stable enlarged cardiac silhouette. No airspace disease, effusion, or pneumothorax. No acute bony abnormalities. IMPRESSION: 1. Stable enlarged cardiac silhouette.  No acute airspace disease. Electronically Signed   By: Sharlet Salina M.D.   On: 07/01/2023 22:59   CT PELVIS WO CONTRAST  Result Date: 07/01/2023 CLINICAL DATA:  History of chronic sacral wounds assess for deep bony involvement EXAM: CT PELVIS WITHOUT CONTRAST TECHNIQUE: Multidetector CT imaging of the pelvis was performed following the standard protocol without intravenous contrast. RADIATION DOSE REDUCTION: This exam was performed according to the departmental dose-optimization program which includes automated exposure  control, adjustment of the mA and/or kV according to patient size and/or use of iterative reconstruction technique. COMPARISON:  04/19/2021 FINDINGS: Urinary Tract: Visualized portions of the right kidney are within normal limits. No obstructive changes are seen. The bladder is decompressed. Bowel: Mild diverticular change of the colon is noted. No evidence of diverticulitis is seen. The appendix is within normal limits. Visualized small bowel is unremarkable. Vascular/Lymphatic: Mild atherosclerotic calcifications of the iliac vessels are seen. No lymphadenopathy is noted. Reproductive:  Prostate is within normal limits. Other:  None. Musculoskeletal: There are significant inflammatory changes identified posterior to the sacrum in the subcutaneous fatty tissue without discrete abscess identified. Focal defect is noted just to the right of the midline adjacent to the inflammatory change which may represent a focal decubitus ulcer. No definitive bony involvement is seen. No extension to the bony structures of the sacrum is identified. IMPRESSION: Inflammatory changes in the subcutaneous tissues over the sacrum consistent with the given clinical history. No discrete abscess is noted. A questionable decubitus ulcer is noted to the right of the midline. No discrete bony involvement is noted. Electronically Signed   By: Alcide Clever M.D.   On: 07/01/2023 22:55    Medications: Infusions:  anticoagulant sodium citrate     [START ON 07/04/2023] ceFAZolin      Scheduled Medications:  apixaban  5 mg Oral BID   Chlorhexidine Gluconate Cloth  6 each Topical Q0600   dextrose       [START ON 07/04/2023] digoxin  0.125 mg Oral Once per day on Monday Wednesday Friday   levothyroxine  25 mcg Oral Q0600   midodrine  30 mg Oral TID WC   pregabalin  50 mg Oral Daily    have reviewed scheduled and prn medications.  Physical Exam: General:NAD, comfortable Heart:RRR, s1s2 nl Lungs:clear b/l, no crackle Abdomen:soft,  Non-tender, distended Extremities: Peripheral edema present Dialysis Access: IJ  Cornelius Moras Juanjose Mojica 07/02/2023,11:08 AM  LOS: 0 days

## 2023-07-02 NOTE — ED Notes (Signed)
CBG 79. RN notified.

## 2023-07-03 DIAGNOSIS — Z992 Dependence on renal dialysis: Secondary | ICD-10-CM | POA: Diagnosis not present

## 2023-07-03 DIAGNOSIS — I5020 Unspecified systolic (congestive) heart failure: Secondary | ICD-10-CM | POA: Diagnosis not present

## 2023-07-03 DIAGNOSIS — N186 End stage renal disease: Secondary | ICD-10-CM | POA: Diagnosis not present

## 2023-07-03 DIAGNOSIS — E875 Hyperkalemia: Secondary | ICD-10-CM | POA: Diagnosis not present

## 2023-07-03 LAB — CBC
HCT: 29.2 % — ABNORMAL LOW (ref 39.0–52.0)
Hemoglobin: 9.2 g/dL — ABNORMAL LOW (ref 13.0–17.0)
MCH: 31.6 pg (ref 26.0–34.0)
MCHC: 31.5 g/dL (ref 30.0–36.0)
MCV: 100.3 fL — ABNORMAL HIGH (ref 80.0–100.0)
Platelets: 220 10*3/uL (ref 150–400)
RBC: 2.91 MIL/uL — ABNORMAL LOW (ref 4.22–5.81)
RDW: 15.1 % (ref 11.5–15.5)
WBC: 5.4 10*3/uL (ref 4.0–10.5)
nRBC: 0 % (ref 0.0–0.2)

## 2023-07-03 LAB — BASIC METABOLIC PANEL
Anion gap: 15 (ref 5–15)
BUN: 44 mg/dL — ABNORMAL HIGH (ref 6–20)
CO2: 23 mmol/L (ref 22–32)
Calcium: 10.1 mg/dL (ref 8.9–10.3)
Chloride: 98 mmol/L (ref 98–111)
Creatinine, Ser: 10.2 mg/dL — ABNORMAL HIGH (ref 0.61–1.24)
GFR, Estimated: 6 mL/min — ABNORMAL LOW (ref >60)
Glucose, Bld: 101 mg/dL — ABNORMAL HIGH (ref 70–99)
Potassium: 5.9 mmol/L — ABNORMAL HIGH (ref 3.5–5.1)
Sodium: 136 mmol/L (ref 135–145)

## 2023-07-03 LAB — RENAL FUNCTION PANEL
Albumin: 3.2 g/dL — ABNORMAL LOW (ref 3.5–5.0)
Anion gap: 14 (ref 5–15)
BUN: 40 mg/dL — ABNORMAL HIGH (ref 6–20)
CO2: 24 mmol/L (ref 22–32)
Calcium: 10 mg/dL (ref 8.9–10.3)
Chloride: 96 mmol/L — ABNORMAL LOW (ref 98–111)
Creatinine, Ser: 9.43 mg/dL — ABNORMAL HIGH (ref 0.61–1.24)
GFR, Estimated: 7 mL/min — ABNORMAL LOW (ref >60)
Glucose, Bld: 78 mg/dL (ref 70–99)
Phosphorus: 8.5 mg/dL — ABNORMAL HIGH (ref 2.5–4.6)
Potassium: 6.7 mmol/L (ref 3.5–5.1)
Sodium: 134 mmol/L — ABNORMAL LOW (ref 135–145)

## 2023-07-03 LAB — DIGOXIN LEVEL: Digoxin Level: 0.9 ng/mL (ref 0.8–2.0)

## 2023-07-03 MED ORDER — SODIUM ZIRCONIUM CYCLOSILICATE 5 G PO PACK
5.0000 g | PACK | Freq: Three times a day (TID) | ORAL | Status: DC
  Administered 2023-07-03 – 2023-07-04 (×5): 5 g via ORAL
  Filled 2023-07-03 (×7): qty 1

## 2023-07-03 MED ORDER — CALCIUM GLUCONATE-NACL 1-0.675 GM/50ML-% IV SOLN
1.0000 g | Freq: Once | INTRAVENOUS | Status: DC
  Filled 2023-07-03: qty 50

## 2023-07-03 MED ORDER — SODIUM ZIRCONIUM CYCLOSILICATE 5 G PO PACK: 5.0000 g | PACK | Freq: Three times a day (TID) | ORAL | Status: DC

## 2023-07-03 MED ORDER — CHLORHEXIDINE GLUCONATE CLOTH 2 % EX PADS: 6.0000 | MEDICATED_PAD | Freq: Every day | CUTANEOUS | Status: DC

## 2023-07-03 NOTE — Progress Notes (Addendum)
[facility] NEPHROLOGY PROGRESS NOTE  Assessment/ Plan: Pt is a 37 y.o. yo male   OP HD orders:  [facility], MWF, 4.5 hr, 2 k2 ca, edw 155.5kg,  [facility] TDC, last OP HD on 12/2.  Cefazolin 2g every HD till 07/04/23.  # Severe hyperkalemia in the setting of missing dialysis: Status post urgent HD on 12/6 at night.  Today's lab w/potassium level of 6.7 therefore we will plan for HD today.  Temporizing with Lokelma until dialysis.  # ESRD MWF: Received HD with 3.5 L UF, blood pressure soft this morning.  IJ for the access.  Patient said he missed outpatient dialysis because of the pain after HD.  He wants to receive pain medication after dialysis.  He also needs motorized wheelchair to go back and forth to dialysis center which needs to be addressed with Child psychotherapist.  # Anemia of ESRD: Monitor hemoglobin, erythropoietin with HD.  # CKD-MBD/hyperphosphatemia: Resume lanthanum and monitor lab.  # Hypotension/volume: On midodrine, blood pressure soft.  # MSSA bacteremia: Currently on cefazolin 2 g daily 12/9.  I have discussed with the patient and her mother who was on the phone today.  Subjective: Seen and examined.  He is mother was on the phone during my visit.  Patient reports generalized body pain and difficulty with transportation going to the dialysis center.  This morning he denies shortness of breath, nausea or vomiting.  Objective Vital signs in last 24 hours: Vitals:   07/02/23 2317 07/03/23 0300 07/03/23 0349 07/03/23 0853  BP: 115/73  113/67 109/62  Pulse: 70 70 82 81  Resp: 17  18 16   Temp: 98.1 F (36.7 C)  98.5 F (36.9 C) 98.2 F (36.8 C)  TempSrc: Oral  Oral Oral  SpO2: 99% 100% 100% 100%  Weight:      Height:       Weight change:   Intake/Output Summary (Last 24 hours) at 07/03/2023 1042 Last data filed at 07/03/2023 4403 Gross per 24 hour  Intake 544.84 ml  Output 50 ml  Net 494.84 ml       Labs: RENAL PANEL Recent Labs  Lab 07/01/23 2208  07/02/23 0637 07/03/23 0337  NA 138 136 134*  K 7.5* 5.0 6.7*  CL 99 98 96*  CO2 22 25 24   GLUCOSE 85 72 78  BUN 68* 38* 40*  CREATININE 13.56* 8.36* 9.43*  CALCIUM 10.2 9.4 10.0  MG 2.0  --   --   PHOS 9.3* 5.7* 8.5*  ALBUMIN 3.1* 3.3* 3.2*    Liver Function Tests: Recent Labs  Lab 07/01/23 2208 07/02/23 0637 07/03/23 0337  AST 28  --   --   ALT 6  --   --   ALKPHOS 87  --   --   BILITOT 1.0  --   --   PROT 7.0  --   --   ALBUMIN 3.1* 3.3* 3.2*   No results for input(s): "LIPASE", "AMYLASE" in the last 168 hours. No results for input(s): "AMMONIA" in the last 168 hours. CBC: Recent Labs    08/09/22 0042 08/10/22 0107 02/21/23 1538 02/22/23 0209 04/14/23 0314 04/15/23 1054 05/19/23 2020 05/20/23 1422 06/19/23 1045 06/21/23 1606 07/01/23 2208 07/02/23 0627 07/03/23 0337  HGB 6.7*   < >  --    < >  --    < >  --    < > 11.3* 11.7* 9.3* 9.0* 9.2*  MCV 106.1*   < >  --    < >  --    < >  --    < >  100.6* 97 100.7* 99.3 100.3*  VITAMINB12  --   --   --   --  1,014*  --   --   --   --   --   --   --   --   FOLATE  --   --  12.0  --   --   --   --   --   --   --   --   --   --   FERRITIN 156  --   --   --   --   --  359*  --   --   --   --   --   --   TIBC 330  --   --   --   --   --  211*  --   --   --   --   --   --   IRON 28*  --   --   --   --   --  64  --   --   --   --   --   --    < > = values in this interval not displayed.    Cardiac Enzymes: No results for input(s): "CKTOTAL", "CKMB", "CKMBINDEX", "TROPONINI" in the last 168 hours. CBG: Recent Labs  Lab 07/02/23 0005 07/02/23 0048 07/02/23 0135  GLUCAP 39* 79 103*    Iron Studies: No results for input(s): "IRON", "TIBC", "TRANSFERRIN", "FERRITIN" in the last 72 hours. Studies/Results: DG Chest Portable 1 View  Result Date: 07/01/2023 CLINICAL DATA:  End-stage renal disease, missed dialysis EXAM: PORTABLE CHEST 1 VIEW COMPARISON:  06/15/2023 FINDINGS: 2 frontal views of the chest demonstrate  right internal jugular dialysis catheter unchanged. Stable enlarged cardiac silhouette. No airspace disease, effusion, or pneumothorax. No acute bony abnormalities. IMPRESSION: 1. Stable enlarged cardiac silhouette.  No acute airspace disease. Electronically Signed   By: Sharlet Salina M.D.   On: 07/01/2023 22:59   CT PELVIS WO CONTRAST  Result Date: 07/01/2023 CLINICAL DATA:  History of chronic sacral wounds assess for deep bony involvement EXAM: CT PELVIS WITHOUT CONTRAST TECHNIQUE: Multidetector CT imaging of the pelvis was performed following the standard protocol without intravenous contrast. RADIATION DOSE REDUCTION: This exam was performed according to the departmental dose-optimization program which includes automated exposure control, adjustment of the mA and/or kV according to patient size and/or use of iterative reconstruction technique. COMPARISON:  04/19/2021 FINDINGS: Urinary Tract: Visualized portions of the right kidney are within normal limits. No obstructive changes are seen. The bladder is decompressed. Bowel: Mild diverticular change of the colon is noted. No evidence of diverticulitis is seen. The appendix is within normal limits. Visualized small bowel is unremarkable. Vascular/Lymphatic: Mild atherosclerotic calcifications of the iliac vessels are seen. No lymphadenopathy is noted. Reproductive:  Prostate is within normal limits. Other:  None. Musculoskeletal: There are significant inflammatory changes identified posterior to the sacrum in the subcutaneous fatty tissue without discrete abscess identified. Focal defect is noted just to the right of the midline adjacent to the inflammatory change which may represent a focal decubitus ulcer. No definitive bony involvement is seen. No extension to the bony structures of the sacrum is identified. IMPRESSION: Inflammatory changes in the subcutaneous tissues over the sacrum consistent with the given clinical history. No discrete abscess is noted.  A questionable decubitus ulcer is noted to the right of the midline. No discrete bony involvement is noted. Electronically Signed   By: Eulah Pont.D.  On: 07/01/2023 22:55    Medications: Infusions:  anticoagulant sodium citrate     [START ON 07/04/2023] ceFAZolin      Scheduled Medications:  apixaban  5 mg Oral BID   Chlorhexidine Gluconate Cloth  6 each Topical Q0600   [START ON 07/04/2023] digoxin  0.125 mg Oral Once per day on Monday Wednesday Friday   lanthanum  1,000 mg Oral TID WC   levothyroxine  25 mcg Oral Q0600   midodrine  30 mg Oral TID WC   pregabalin  50 mg Oral Daily   sodium zirconium cyclosilicate  5 g Oral TID    have reviewed scheduled and prn medications.  Physical Exam: General:NAD, comfortable Heart:RRR, s1s2 nl Lungs:clear b/l, no crackle Abdomen:soft, Non-tender, distended Extremities: Peripheral edema present Dialysis Access: IJ  Phillip Young Phillip Young 07/03/2023,10:42 AM  LOS: 1 day

## 2023-07-03 NOTE — Progress Notes (Signed)
HD#1 Subjective:   Summary: Phillip Young is a 37 y.o. male with pertinent PMH of ESRD on HD MWF, chronic hypoxic respiratory failure 2/2 suspected amiodarone toxicity, persistent atrial fibrillation, HFrEF who presented with increasing pain and is admitted for hyperkalemia.   Overnight Events: Potassium 6.7, received lokelma. EKG unchanged from previous.  Patient describes how the fentanyl patch has been too much for him, making him feel groggy and confused. He likes the oxycodone 10mg  as it doesn't make him feel nauseous. Explained his hyperkalemia and possible need for another session of dialysis today. Patient's main concern is his neuropathy.   Objective:  Vital signs in last 24 hours: Vitals:   07/02/23 2317 07/03/23 0300 07/03/23 0349 07/03/23 0853  BP: 115/73  113/67 109/62  Pulse: 70 70 82 81  Resp: 17  18 16   Temp: 98.1 F (36.7 C)  98.5 F (36.9 C) 98.2 F (36.8 C)  TempSrc: Oral  Oral Oral  SpO2: 99% 100% 100% 100%  Weight:      Height:       Supplemental O2: Nasal Cannula SpO2: 100 % O2 Flow Rate (L/min): 3 L/min   Physical Exam:  Constitutional: Chronically ill-appearing obese male Cardiovascular: Irregularly irregular rhythm with normal rate Pulmonary/Chest:no significant crackles or focal lung sounds on anterior and lateral lung fields MSK: Pitting edema in the lower extremities bilaterally and significant pain to palpation in bilateral lower extremities below the knees without overlying skin changes, wounds, or other abnormalities Neurological: alert & oriented x 3  Pertinent Labs:    Latest Ref Rng & Units 07/03/2023    3:37 AM 07/02/2023    6:27 AM 07/01/2023   10:08 PM  CBC  WBC 4.0 - 10.5 K/uL 5.4  4.9  5.9   Hemoglobin 13.0 - 17.0 g/dL 9.2  9.0  9.3   Hematocrit 39.0 - 52.0 % 29.2  27.9  29.7   Platelets 150 - 400 K/uL 220  217  211        Latest Ref Rng & Units 07/03/2023    3:37 AM 07/02/2023    6:37 AM 07/01/2023   10:08 PM  CMP   Glucose 70 - 99 mg/dL 78  72  85   BUN 6 - 20 mg/dL 40  38  68   Creatinine 0.61 - 1.24 mg/dL 7.82  9.56  21.30   Sodium 135 - 145 mmol/L 134  136  138   Potassium 3.5 - 5.1 mmol/L 6.7  5.0  7.5   Chloride 98 - 111 mmol/L 96  98  99   CO2 22 - 32 mmol/L 24  25  22    Calcium 8.9 - 10.3 mg/dL 86.5  9.4  78.4   Total Protein 6.5 - 8.1 g/dL   7.0   Total Bilirubin <1.2 mg/dL   1.0   Alkaline Phos 38 - 126 U/L   87   AST 15 - 41 U/L   28   ALT 0 - 44 U/L   6     Assessment/Plan:   Hyperkalemia Hyperphosphatemia ESRD on HD MWF Hyperkalemic to 6.7 today, received lokelma. Also messaged nephrology about whether he will need an extra session of dialysis today. Repeat BMP pending for this afternoon in case he doesn't get HD today.  - HD per nephrology - Repeat renal function panel this afternoon  Compressive neuropathy Lower extremity pain Chronic lower extremity neuropathy continues to be a greatest complaint. He took off the fentanyl patch because it was  making him too groggy. Fentanyl patch discontinued. Continuing oxy 10mg  q6h prn and home pregabalin.   Chronic/resolved issues Permanent A-fib HFrEF History of MSSA bacteremia Hypertension Chronic hypoxic respiratory failure 2/2 amiodarone toxicity Anemia of chronic disease Macrocytic anemia Hyperglycemia Decubitus ulcers -For these issues we will continue his home medications and monitor blood pressure, oxygen requirements, blood counts, CBG, and frequent wound care  Diet: Renal IVF: None VTE: DOAC Code: Full PT/OT recs: Pending.  Dispo: Anticipated discharge to Home in 1-2 days pending continued improvement after dialysis and ensured good transport plan for dialysis outpatient.   Monna Fam, MD Internal Medicine Resident PGY-1 Pager: [phone]  Please contact the on call pager after 5 pm and on weekends at [phone].

## 2023-07-03 NOTE — Progress Notes (Signed)
Inpatient Rehab Admissions Coordinator:   Per therapy recommendations, patient was screened for CIR candidacy by Megan Salon, MS, CCC-SLP. At this time, Pt. is pain limited and not at a level to tolerate the intensity of CIR at this time; however,   Pt. may have potential to progress to becoming a potential CIR candidate, so CIR admissions team will follow and monitor for progress and participation with therapies and place consult order if Pt. appears to be an appropriate candidate. Please contact me with any questions.   Megan Salon, MS, CCC-SLP Rehab Admissions Coordinator  [phone] (celll) [phone] (office)

## 2023-07-03 NOTE — Progress Notes (Addendum)
Pt goal met. Used sodium citrate for locked catheter.  07/03/23 1919  Vitals  Temp 98.7 F (37.1 C)  Temp Source Oral  BP 116/68  BP Location Left Wrist  BP Method Automatic  Patient Position (if appropriate) Lying  Pulse Rate 68  Resp 20  Oxygen Therapy  SpO2 100 %  O2 Device Nasal Cannula  O2 Flow Rate (L/min) 4 L/min  During Treatment Monitoring  Intra-Hemodialysis Comments Tx completed  Post Treatment  Dialyzer Clearance Lightly streaked  Hemodialysis Intake (mL) 0 mL  Liters Processed 80  Fluid Removed (mL) 3000 mL  Tolerated HD Treatment Yes  Post-Hemodialysis Comments Pt goal met.  Hemodialysis Catheter Right Internal jugular Double lumen Permanent (Tunneled)  Placement Date/Time: 05/30/23 0856   Serial / Lot #: [id number]  Expiration Date: 09/23/27  Time Out: Correct patient;Correct site;Correct procedure  Maximum sterile barrier precautions: Hand hygiene;Cap;Mask;Sterile gown;Sterile gloves;Large sterile s...  Site Condition No complications  Catheter fill solution 4% Sodium Citrate  Catheter fill volume (Arterial) 1.9 cc  Catheter fill volume (Venous) 1.9  Dressing Type Transparent;Gauze/Drain sponge  Dressing Status Antimicrobial disc in place;Clean, Dry, Intact  Interventions Dressing reinforced  Drainage Description None  Dressing Change Due 07/04/23  Post treatment catheter status Capped and Clamped

## 2023-07-03 NOTE — Progress Notes (Addendum)
Called to see patient after he was mistakenly given a few cc's of heparin into his HD catheter before the patient asked if that was citrate and not heparin.  He is HIT positive and is very upset and concerned.  The RN quickly withdrew the heparin from the HD catheter and then placed citrate lock.  Will recheck platelet count tomorrow.  He is currently hemodynamically stable and without any distress.  He is complaining of neuropathy of his feet which has been an ongoing issue.

## 2023-07-03 NOTE — Progress Notes (Signed)
OT Cancellation Note  Patient Details Name: Phillip Young MRN: [id number] DOB: 31-Aug-1985   Cancelled Treatment:    Reason Eval/Treat Not Completed: Patient at procedure or test/ unavailable (Attempted to see pt for OT eval. However, pt is currently off unit for HD. OT to reattempt to see pt at a later time as appropriate/available.)  Shruti Arrey "Ronaldo Miyamoto" M., OTR/L, MA Acute Rehab [phone]   Lendon Colonel 07/03/2023, 4:20 PM

## 2023-07-04 ENCOUNTER — Inpatient Hospital Stay (HOSPITAL_COMMUNITY): Payer: Medicaid Other

## 2023-07-04 DIAGNOSIS — N186 End stage renal disease: Secondary | ICD-10-CM | POA: Diagnosis not present

## 2023-07-04 DIAGNOSIS — E875 Hyperkalemia: Secondary | ICD-10-CM | POA: Diagnosis not present

## 2023-07-04 DIAGNOSIS — Z992 Dependence on renal dialysis: Secondary | ICD-10-CM | POA: Diagnosis not present

## 2023-07-04 LAB — CBC
HCT: 29.9 % — ABNORMAL LOW (ref 39.0–52.0)
Hemoglobin: 9.6 g/dL — ABNORMAL LOW (ref 13.0–17.0)
MCH: 31.4 pg (ref 26.0–34.0)
MCHC: 32.1 g/dL (ref 30.0–36.0)
MCV: 97.7 fL (ref 80.0–100.0)
Platelets: 219 10*3/uL (ref 150–400)
RBC: 3.06 MIL/uL — ABNORMAL LOW (ref 4.22–5.81)
RDW: 14.8 % (ref 11.5–15.5)
WBC: 5.8 10*3/uL (ref 4.0–10.5)
nRBC: 0 % (ref 0.0–0.2)

## 2023-07-04 LAB — RENAL FUNCTION PANEL
Albumin: 3.1 g/dL — ABNORMAL LOW (ref 3.5–5.0)
Anion gap: 14 (ref 5–15)
BUN: 26 mg/dL — ABNORMAL HIGH (ref 6–20)
CO2: 24 mmol/L (ref 22–32)
Calcium: 9.5 mg/dL (ref 8.9–10.3)
Chloride: 97 mmol/L — ABNORMAL LOW (ref 98–111)
Creatinine, Ser: 6.95 mg/dL — ABNORMAL HIGH (ref 0.61–1.24)
GFR, Estimated: 10 mL/min — ABNORMAL LOW (ref >60)
Glucose, Bld: 90 mg/dL (ref 70–99)
Phosphorus: 5.7 mg/dL — ABNORMAL HIGH (ref 2.5–4.6)
Potassium: 4.7 mmol/L (ref 3.5–5.1)
Sodium: 135 mmol/L (ref 135–145)

## 2023-07-04 LAB — GLUCOSE, CAPILLARY: Glucose-Capillary: 90 mg/dL (ref 70–99)

## 2023-07-04 LAB — POTASSIUM: Potassium: 4.6 mmol/L (ref 3.5–5.1)

## 2023-07-04 MED ORDER — CHLORHEXIDINE GLUCONATE CLOTH 2 % EX PADS
6.0000 | MEDICATED_PAD | Freq: Every day | CUTANEOUS | Status: DC
  Administered 2023-07-04 – 2023-07-05 (×2): 6 via TOPICAL

## 2023-07-04 MED ORDER — OXYCODONE HCL 5 MG PO TABS
10.0000 mg | ORAL_TABLET | ORAL | Status: DC | PRN
  Administered 2023-07-04 – 2023-07-05 (×3): 10 mg via ORAL
  Filled 2023-07-04 (×3): qty 2

## 2023-07-04 MED ORDER — OXYCODONE HCL 5 MG PO TABS
10.0000 mg | ORAL_TABLET | Freq: Once | ORAL | Status: AC
  Administered 2023-07-04: 10 mg via ORAL
  Filled 2023-07-04: qty 2

## 2023-07-04 NOTE — Progress Notes (Signed)
Palliative:  Chart review and complicated dynamics with care and family historically. Discussed with chaplain who is consulted and will visit with Phillip Young. Per chaplain he is open to further visits and support from palliative care. Will not see today but will follow along with care.   No charge  Yong Channel, NP Palliative Medicine Team Pager [phone] (Please see [email or web address] for schedule) Team Phone [phone]

## 2023-07-04 NOTE — Progress Notes (Signed)
Pt BP 135/111. Patient first time up in a chair this admission. MD notified. Pt complains of leg pain 9/10 bilateral legs, refuses pain medication at this time.  Kenard Gower, RN

## 2023-07-04 NOTE — TOC Progression Note (Signed)
Transition of Care [facility]) - Progression Note    Patient Details  Name: Phillip Young MRN: [id number] Date of Birth: Oct 31, 1985  [facility] CM/SW Contact  Eduard Roux, [place] Phone Number: 07/04/2023, 10:18 AM  Clinical Narrative:     CSW received message from Peresz,MD- patient barrier to d/c is he is unable to get to outpatient dialysis due to steps at the home.   CSW contacted the patient's mother- she explained the patient is in her home but only there temporarily, which means she is unable to contact landlord for ramp. She confirmed patient has his own place as listed on demographics sheet but his home has steps too. She states sometimes family (patient's uncles) are there to assist and takes the patient to dialysis - they push the patient around the building in [place] wheelchair (unpaved). She reports, depending on the driver for transport, some will pull around the back and other will inform it is against their policy. She states she is currently looking for a place for them. She reports she receives too much income (retirement & disability) to be eligible for housing assistance but she is searching for an affordable place ( advised she can use TEFL teacher and Rent [employer] Home website). She states a motorized wheel chair would most beneficial for him at this time. Per her report she was informed by insurance, MD write a script and she can take to DME store and they can start the claim.   CSW will follow up with RN Case Manager and call patient's mother back regarding motorized wheel chair.   Antony Blackbird, MSW, LCSW Clinical Social Worker         Expected Discharge Plan and Services In-house Referral: Clinical Social Work     Living arrangements for the past 2 months: Single Family Home                                       Social Determinants of Health (SDOH) Interventions SDOH Screenings   Food Insecurity: No Food Insecurity (07/02/2023)   Housing: Low Risk  (07/02/2023)  Transportation Needs: Unmet Transportation Needs (07/02/2023)  Utilities: Not At Risk (07/02/2023)  Alcohol Screen: Low Risk  (09/21/2022)  Depression (PHQ2-9): High Risk (06/21/2023)  Financial Resource Strain: Low Risk  (03/24/2023)   Received from [facility]  Physical Activity: Inactive (09/21/2022)  Social Connections: Moderately Isolated (03/24/2023)   Received from [facility]  Stress: No Stress Concern Present (04/21/2023)   Received from [facility]  Tobacco Use: Medium Risk (07/01/2023)    Readmission Risk Interventions    05/24/2023    4:43 PM 03/23/2023    3:48 PM 06/09/2022   12:19 PM  Readmission Risk Prevention Plan  Transportation Screening Complete Complete Complete  PCP or Specialist Appt within 5-7 Days Complete    Home Care Screening Complete    Medication Review (RN CM) Referral to Pharmacy    Medication Review (RN Care Manager)  Complete Complete  PCP or Specialist appointment within 3-5 days of discharge  -- Complete  HRI or Home Care Consult  Complete Complete  SW Recovery Care/Counseling Consult  Complete Complete  Palliative Care Screening  Not Applicable Not Applicable  Skilled Nursing Facility  Not Applicable Not Applicable

## 2023-07-04 NOTE — Hospital Course (Signed)
12/9 - Patient concerned about HIT due to low platelets - Reassured patient, patient expressed gratitude for care so far - Discussed increased frequency of oxy q4H  - Discussed next steps including PT eval today, will help determine next steps like SNF or home  - Discussed providing letter for mother to move apartments to first floor apartment, however price for basement apartment, patient interested in electric scooter

## 2023-07-04 NOTE — Progress Notes (Signed)
Physical Therapy Treatment Patient Details Name: Phillip Young MRN: [id number] DOB: [birth year] Today's Date: 07/04/2023   History of Present Illness Pt is a 37 y.o. male who presented 07/01/23 with increasing lower extremity pain and is admitted for hyperkalemia. PMH PMH: morbid obesity, HTN, prediabetes, OSA on home BiPAP, obesity hypoventilation syndrome, chronic systolic heart failure due to nonischemic cardiomyopathy, dialysis dependent ESRD, and paroxysmal atrial fibrillation/flutter    PT Comments  Pt able to ambulate in room this date however continues to be overall deconditioned with decreased activity tolerance, and continues to require RW for safe ambulation. [name] HR did increase to 150s during ambulation and pt with report of dizziness. Pt educated on seated LE HEP exercises to help address decreased activity tolerance and improve strength. Continue to recommend inpatient rehab program > 3 hrs /day to address above deficits and be able to negotiate stairs to access his home and/or mothers home.  Acute PT to cont to follow.    If plan is discharge home, recommend the following: Two people to help with walking and/or transfers;Two people to help with bathing/dressing/bathroom;Assistance with cooking/housework;Assist for transportation;Help with stairs or ramp for entrance   Can travel by private vehicle        Equipment Recommendations  Wheelchair (measurements PT);Wheelchair cushion (measurements PT);Hospital bed;Hoyer lift (electric w/c - bariatric)    Recommendations for Other Services Rehab consult     Precautions / Restrictions Precautions Precautions: Fall;Other (comment) Precaution Comments: watch HR Required Braces or Orthoses: Other Brace Other Brace: has AFO at home but reports ace wraps more helpful for R foot drop Restrictions Weight Bearing Restrictions: No     Mobility  Bed Mobility Overal bed mobility: Needs Assistance Bed Mobility: Supine to Sit, Rolling      Supine to sit: Min assist, HOB elevated, Used rails     General bed mobility comments: pt able to bring LEs off EOB, minA for trunk elevation    Transfers Overall transfer level: Needs assistance Equipment used: Rolling walker (2 wheels) Transfers: Sit to/from Stand Sit to Stand: Min assist           General transfer comment: Pt requesting very elevated bed to stand with RW at CGA. required min/modAx2 to power up from chair due to lower surface height    Ambulation/Gait Ambulation/Gait assistance: Min assist, +2 safety/equipment Gait Distance (Feet): 10 Feet (x2) Assistive device: Rolling walker (2 wheels) Gait Pattern/deviations: Step-to pattern, Decreased stride length, Decreased dorsiflexion - right, Decreased weight shift to right, Wide base of support Gait velocity: dec     General Gait Details: pt with noted R drop foot requiring pt to increased R hip flexion to clear foot. Pt very dependent on bilat UEs on walker, noted SOB and HR increased to 155bpm with pt reporting "my heart is beating super fast". amb 10' sat in chair and then amb 10' back.   Stairs             Wheelchair Mobility     Tilt Bed    Modified Rankin (Stroke Patients Only)       Balance Overall balance assessment: Needs assistance Sitting-balance support: Feet unsupported, No upper extremity supported Sitting balance-Leahy Scale: Fair Sitting balance - Comments: CGA for static sitting balance with UE support and pt unable to touch feet to ground due to extreme pain with touch at bil feet this date   Standing balance support: Bilateral upper extremity supported, During functional activity Standing balance-Leahy Scale: Poor Standing balance comment: increased dependence on  UEs on RW due to bilat LE weaknes and bilat foot pain                            Cognition Arousal: Alert Behavior During Therapy: WFL for tasks assessed/performed Overall Cognitive Status: Within  Functional Limits for tasks assessed                                 General Comments: hyperverbose, pleasant, likely at baseline.motivated to get stronger        Exercises      General Comments General comments (skin integrity, edema, etc.): HR increased to 150s during ambulation, +SOB, SpO2 88% on 2Lo2 via [place]      Pertinent Vitals/Pain Pain Assessment Pain Assessment: Faces Faces Pain Scale: Hurts even more Pain Location: bil lower legs and feet Pain Descriptors / Indicators: Discomfort, Grimacing, Guarding, Sharp (neuropathy) Pain Intervention(s): Monitored during session    Home Living Family/patient expects to be discharged to:: Private residence Living Arrangements: Parent Available Help at Discharge: Family;Available 24 hours/day Type of Home: Apartment Home Access: Stairs to enter;Level entry Entrance Stairs-Rails: Right;Left;Can reach both Entrance Stairs-Number of Steps: 15-20 to go downstairs to his basement apartment, or can go down a hill to the back for a level entrance Alternate Level Stairs-Number of Steps: 2 steps at back door Home Layout: One level Home Equipment: Wheelchair - Forensic psychologist (2 wheels);BSC/3in1      Prior Function            PT Goals (current goals can now be found in the care plan section) Acute Rehab PT Goals Patient Stated Goal: to reduce pain PT Goal Formulation: With patient Time For Goal Achievement: 07/16/23 Potential to Achieve Goals: Fair Progress towards PT goals: Progressing toward goals    Frequency    Min 1X/week      PT Plan      Co-evaluation              AM-PAC PT "6 Clicks" Mobility   Outcome Measure  Help needed turning from your back to your side while in a flat bed without using bedrails?: A Lot Help needed moving from lying on your back to sitting on the side of a flat bed without using bedrails?: A Lot Help needed moving to and from a bed to a chair (including a  wheelchair)?: Total Help needed standing up from a chair using your arms (e.g., wheelchair or bedside chair)?: Total Help needed to walk in hospital room?: Total Help needed climbing 3-5 steps with a railing? : Total 6 Click Score: 8    End of Session Equipment Utilized During Treatment: Oxygen Activity Tolerance: Patient limited by pain Patient left: with call bell/phone within reach;in chair Nurse Communication: Mobility status PT Visit Diagnosis: Unsteadiness on feet (R26.81);Muscle weakness (generalized) (M62.81);Difficulty in walking, not elsewhere classified (R26.2);Pain Pain - Right/Left:  (bil) Pain - part of body: Leg;Ankle and joints of foot     Time: 5284-1324 PT Time Calculation (min) (ACUTE ONLY): 28 min  Charges:    $Gait Training: 23-37 mins PT General Charges $$ ACUTE PT VISIT: 1 Visit                     Lewis Shock, PT, DPT Acute Rehabilitation Services Secure chat preferred Office #: [phone]    Phillip Young 07/04/2023, 1:43 PM

## 2023-07-04 NOTE — Progress Notes (Signed)
Patient refused midodrine, MD notified.   Kenard Gower, RN

## 2023-07-04 NOTE — Evaluation (Signed)
Occupational Therapy Evaluation Patient Details Name: Phillip Young MRN: [id number] DOB: 1985-12-23 Today's Date: 07/04/2023   History of Present Illness Pt is a 37 y.o. male who presented 07/01/23 with increasing lower extremity pain and is admitted for hyperkalemia. PMH PMH: morbid obesity, HTN, prediabetes, OSA on home BiPAP, obesity hypoventilation syndrome, chronic systolic heart failure due to nonischemic cardiomyopathy, dialysis dependent ESRD, and paroxysmal atrial fibrillation/flutter   Clinical Impression   PTA, pt lives with mother in apartment with 15-20 steps to access unit. Pt reports typically ambulatory (more recently with a RW) though significant limited due to BLE pain (neuropathy per pt). Pt typically able to manage ADLs but with increasing difficulty due to pain. Pt presents now with deficits in cardiopulmonary endurance and BLE pain. Pt able to tolerate standing attempts and side steps at bedside using RW with CGA. Pt requires Setup for UB ADL and up to Max A for LB ADLs d/t deficits. Pt reports doing well with intensive rehab services in the past and interested in further rehab services prior to DC home d/t anticipated difficulty managing at this level.   HR up to 144 bpm with activity.        If plan is discharge home, recommend the following: A little help with walking and/or transfers;A lot of help with bathing/dressing/bathroom;Assistance with cooking/housework;Help with stairs or ramp for entrance;Assist for transportation    Functional Status Assessment  Patient has had a recent decline in their functional status and demonstrates the ability to make significant improvements in function in a reasonable and predictable amount of time.  Equipment Recommendations  None recommended by OT    Recommendations for Other Services Rehab consult     Precautions / Restrictions Precautions Precautions: Fall;Other (comment) Precaution Comments: watch HR; sensitive to touch in  lower legs bil Required Braces or Orthoses: Other Brace Other Brace: has AFO at home but reports ace wraps more helpful for R foot drop Restrictions Weight Bearing Restrictions: No      Mobility Bed Mobility Overal bed mobility: Needs Assistance Bed Mobility: Supine to Sit, Rolling Rolling: Mod assist, Used rails   Supine to sit: Modified independent (Device/Increase time)     General bed mobility comments: Mod A to roll for initial nursing assessment of bottom. Able to sit EOB with use of bed rails without assist    Transfers Overall transfer level: Needs assistance Equipment used: Rolling walker (2 wheels) Transfers: Sit to/from Stand Sit to Stand: Contact guard assist, From elevated surface           General transfer comment: Pt requesting very elevated bed to stand with RW at [facility]. able to take side steps at bedside with CGA. Deferred further mobility until bariatric RW able to be obtained      Balance Overall balance assessment: Needs assistance Sitting-balance support: Feet unsupported, No upper extremity supported Sitting balance-Leahy Scale: Fair     Standing balance support: Bilateral upper extremity supported, During functional activity Standing balance-Leahy Scale: Poor                             ADL either performed or assessed with clinical judgement   ADL Overall ADL's : Needs assistance/impaired Eating/Feeding: Independent   Grooming: Set up;Sitting   Upper Body Bathing: Set up;Sitting   Lower Body Bathing: Moderate assistance;Sit to/from stand;Sitting/lateral leans   Upper Body Dressing : Set up;Sitting   Lower Body Dressing: Sitting/lateral leans;Sit to/from stand;Maximal assistance Lower Body Dressing  Details (indicate cue type and reason): Max A to don crocs     Toileting- Architect and Hygiene: Moderate assistance;Sitting/lateral lean;Sit to/from stand         General ADL Comments: Limited by reported high  pain levels in BLE due to neuropathy.     Vision Baseline Vision/History: 0 No visual deficits Ability to See in Adequate Light: 0 Adequate Patient Visual Report: No change from baseline Vision Assessment?: No apparent visual deficits     Perception         Praxis         Pertinent Vitals/Pain Pain Assessment Pain Assessment: Faces Faces Pain Scale: Hurts little more Pain Location: bil lower legs and feet Pain Descriptors / Indicators: Discomfort, Grimacing, Guarding, Sharp Pain Intervention(s): Monitored during session, Limited activity within patient's tolerance     Extremity/Trunk Assessment Upper Extremity Assessment Upper Extremity Assessment: Overall WFL for tasks assessed;Right hand dominant   Lower Extremity Assessment Lower Extremity Assessment: Defer to PT evaluation   Cervical / Trunk Assessment Cervical / Trunk Assessment: Other exceptions Cervical / Trunk Exceptions: increased body habitus   Communication Communication Communication: No apparent difficulties Cueing Techniques: Verbal cues;Gestural cues   Cognition Arousal: Alert Behavior During Therapy: WFL for tasks assessed/performed Overall Cognitive Status: Within Functional Limits for tasks assessed                                 General Comments: hyperverbose, pleasant, likely at baseline. does endorse emotional distress given ongoing medical issues     General Comments  Nursing in to assess and measure wound on bottom. Noted pt's phone on facetime though no person noted on other side (pt reports it is his mother)    Exercises     Shoulder Instructions      Home Living Family/patient expects to be discharged to:: Private residence Living Arrangements: Parent Available Help at Discharge: Family;Available 24 hours/day Type of Home: Apartment Home Access: Stairs to enter;Level entry Entrance Stairs-Number of Steps: 15-20 to go downstairs to his basement apartment, or can go  down a hill to the back for a level entrance Entrance Stairs-Rails: Right;Left;Can reach both Home Layout: One level Alternate Level Stairs-Number of Steps: 2 steps at back door Alternate Level Stairs-Rails: None Bathroom Shower/Tub: Tub/shower unit   Bathroom Toilet: Handicapped height Bathroom Accessibility: Yes   Home Equipment: Wheelchair - Forensic psychologist (2 wheels);BSC/3in1          Prior Functioning/Environment Prior Level of Function : Independent/Modified Independent             Mobility Comments: Per chart, prior to admission in June 2024 pt was independent with mobility. Pt reports he has been mod I for household distance gait bouts using the RW recently ADLs Comments: sponge baths 2/2 dialysis port, typically has been able to manage ADLs without assist but started using bed pan at home when he started missing dialysis & BLE pain increased        OT Problem List: Decreased activity tolerance;Impaired balance (sitting and/or standing);Decreased knowledge of use of DME or AE;Pain;Cardiopulmonary status limiting activity      OT Treatment/Interventions: Self-care/ADL training;Therapeutic exercise;Energy conservation;DME and/or AE instruction;Therapeutic activities;Patient/family education;Balance training    OT Goals(Current goals can be found in the care plan section) Acute Rehab OT Goals Patient Stated Goal: be able to walk, get pain under control, be able to work again OT Goal Formulation: With patient Time For  Goal Achievement: 07/18/23 Potential to Achieve Goals: Good  OT Frequency: Min 1X/week    Co-evaluation              AM-PAC OT "6 Clicks" Daily Activity     Outcome Measure Help from another person eating meals?: None Help from another person taking care of personal grooming?: A Little Help from another person toileting, which includes using toliet, bedpan, or urinal?: A Lot Help from another person bathing (including washing, rinsing,  drying)?: A Lot Help from another person to put on and taking off regular upper body clothing?: A Little Help from another person to put on and taking off regular lower body clothing?: A Lot 6 Click Score: 16   End of Session Equipment Utilized During Treatment: Rolling walker (2 wheels) Nurse Communication: Mobility status  Activity Tolerance: Patient tolerated treatment well Patient left: in bed;with call bell/phone within reach  OT Visit Diagnosis: Unsteadiness on feet (R26.81);Other abnormalities of gait and mobility (R26.89);Muscle weakness (generalized) (M62.81)                Time: 1610-9604 OT Time Calculation (min): 43 min Charges:  OT General Charges $OT Visit: 1 Visit OT Evaluation $OT Eval Moderate Complexity: 1 Mod OT Treatments $Self Care/Home Management : 8-22 mins $Therapeutic Activity: 8-22 mins  Bradd Canary, OTR/L Acute Rehab Services Office: [phone]   Lorre Munroe 07/04/2023, 11:55 AM

## 2023-07-04 NOTE — Progress Notes (Signed)
[facility] Progress Note   Subjective:   See note regarding heparin in cath lock last night. Patient is very upset about this today, Platelet count is stable. Will make sure HD supervisor is aware of the issue. He denies SOB, CP, dizziness, nausea. He would like to wait until tomorrow for next HD.   Objective Vitals:   07/04/23 0100 07/04/23 0200 07/04/23 0418 07/04/23 0747  BP:   97/69 115/80  Pulse: (!) 104 (!) 102 91   Resp:   16 20  Temp:   98.6 F (37 C) 98.4 F (36.9 C)  TempSrc:   Oral Oral  SpO2:   96% 96%  Weight:      Height:       Physical Exam General: Alert male in NAD Heart: Slightly tachycardic, regular rhythm, no murmur Lungs: Lungs CTA bilaterally, respirations unlabored on RA Abdomen: Soft, non-distended, +BS  Extremities: No edema b/l lower extremities Dialysis Access:  [facility]  Additional Objective Labs: Basic Metabolic Panel: Recent Labs  Lab 07/02/23 0637 07/03/23 0337 07/03/23 1148 07/04/23 0016 07/04/23 0309  NA 136 134* 136  --  135  K 5.0 6.7* 5.9* 4.6 4.7  CL 98 96* 98  --  97*  CO2 25 24 23   --  24  GLUCOSE 72 78 101*  --  90  BUN 38* 40* 44*  --  26*  CREATININE 8.36* 9.43* 10.20*  --  6.95*  CALCIUM 9.4 10.0 10.1  --  9.5  PHOS 5.7* 8.5*  --   --  5.7*   Liver Function Tests: Recent Labs  Lab 07/01/23 2208 07/02/23 0637 07/03/23 0337 07/04/23 0309  AST 28  --   --   --   ALT 6  --   --   --   ALKPHOS 87  --   --   --   BILITOT 1.0  --   --   --   PROT 7.0  --   --   --   ALBUMIN 3.1* 3.3* 3.2* 3.1*   No results for input(s): "LIPASE", "AMYLASE" in the last 168 hours. CBC: Recent Labs  Lab 07/01/23 2208 07/02/23 0627 07/03/23 0337 07/04/23 0309  WBC 5.9 4.9 5.4 5.8  NEUTROABS 4.1  --   --   --   HGB 9.3* 9.0* 9.2* 9.6*  HCT 29.7* 27.9* 29.2* 29.9*  MCV 100.7* 99.3 100.3* 97.7  PLT 211 217 220 219   Blood Culture    Component Value Date/Time   SDES BLOOD BLOOD RIGHT HAND 05/27/2023 0959    SPECREQUEST  05/27/2023 0959    BOTTLES DRAWN AEROBIC AND ANAEROBIC Blood Culture adequate volume   CULT  05/27/2023 0959    NO GROWTH 5 DAYS Performed at [facility], [address] [place], [place] [address]    REPTSTATUS 06/01/2023 FINAL 05/27/2023 1914    Cardiac Enzymes: No results for input(s): "CKTOTAL", "CKMB", "CKMBINDEX", "TROPONINI" in the last 168 hours. CBG: Recent Labs  Lab 07/02/23 0005 07/02/23 0048 07/02/23 0135  GLUCAP 39* 79 103*   Iron Studies: No results for input(s): "IRON", "TIBC", "TRANSFERRIN", "FERRITIN" in the last 72 hours. @lablastinr3 @ Studies/Results: No results found. Medications:  ceFAZolin      apixaban  5 mg Oral BID   Chlorhexidine Gluconate Cloth  6 each Topical Q0600   Chlorhexidine Gluconate Cloth  6 each Topical Q0600   digoxin  0.125 mg Oral Once per day on Monday Wednesday Friday   lanthanum  1,000 mg  Oral TID WC   levothyroxine  25 mcg Oral Q0600   midodrine  30 mg Oral TID WC   pregabalin  50 mg Oral Daily   sodium zirconium cyclosilicate  5 g Oral TID    Dialysis Orders: [facility], MWF, 4.5 hr, 2 k2 ca, edw 155.5kg,  [facility] TDC, last OP HD on 12/2.  Cefazolin 2g every HD till 07/04/23.  Assessment/Plan: # Severe hyperkalemia in the setting of missing dialysis: Status post urgent HD on 12/6 at night.  Underwent HD again 07/03/23 due to hyperkalemia. K+ 4.7 today.    # ESRD MWF:   Patient said he missed outpatient dialysis because of the pain after HD and transportation issues. Typically on MWF schedule but had HD off schedule Sunday due to hyperkalemia. No emergent indications for HD today, next HD tomorrow. HIT positive, use sodium citrate for cath locks.    # Anemia of ESRD: Hgb 9.6. Was not on ESA outpatient, will start aranesp.    # CKD-MBD/hyperphosphatemia: Resume lanthanum and monitor lab.   # Hypotension/volume: On midodrine, blood pressure soft. Does not appear volume overloaded on exam. Weight disparity per  charting, follow trend   # MSSA bacteremia: Currently on cefazolin 2 g daily 12/9.  Rogers Blocker, PA-C 07/04/2023, 10:08 AM  [facility] Pager: [phone]

## 2023-07-04 NOTE — Progress Notes (Signed)
HD#2 Subjective:   Summary: Phillip Young is a 37 y.o. male with pertinent PMH of ESRD on HD MWF, chronic hypoxic respiratory failure 2/2 suspected amiodarone toxicity, persistent atrial fibrillation, HFrEF who presented with increasing pain and is admitted for hyperkalemia.   Patient feeling well today. He is very distressed about receiving a small amount of heparin in his cath lock during dialysis yesterday, given his history of HIT. The pain in his legs is relatively stable. Discussed the need for him to participate in PT in order to prove he would be able to participate in CIR, otherwise he might need to go to a SNF. Patient was very motivated to participate in therapy today.   Objective:  Vital signs in last 24 hours: Vitals:   07/04/23 0100 07/04/23 0200 07/04/23 0418 07/04/23 0747  BP:   97/69 115/80  Pulse: (!) 104 (!) 102 91   Resp:   16 20  Temp:   98.6 F (37 C) 98.4 F (36.9 C)  TempSrc:   Oral Oral  SpO2:   96% 96%  Weight:      Height:       Supplemental O2: Nasal Cannula SpO2: 96 % O2 Flow Rate (L/min): 3 L/min   Physical Exam:  Constitutional: Chronically ill-appearing obese male Cardiovascular: Irregularly irregular rhythm, tachycardic Pulmonary/Chest:no significant crackles or focal lung sounds on anterior and lateral lung fields MSK: Pitting edema in the lower extremities bilaterally and significant pain to palpation in bilateral lower extremities below the knees without overlying skin changes, wounds, or other abnormalities Neurological: alert & oriented x 3  Pertinent Labs:    Latest Ref Rng & Units 07/04/2023    3:09 AM 07/03/2023    3:37 AM 07/02/2023    6:27 AM  CBC  WBC 4.0 - 10.5 K/uL 5.8  5.4  4.9   Hemoglobin 13.0 - 17.0 g/dL 9.6  9.2  9.0   Hematocrit 39.0 - 52.0 % 29.9  29.2  27.9   Platelets 150 - 400 K/uL 219  220  217        Latest Ref Rng & Units 07/04/2023    3:09 AM 07/04/2023   12:16 AM 07/03/2023   11:48 AM  CMP  Glucose 70 -  99 mg/dL 90   644   BUN 6 - 20 mg/dL 26   44   Creatinine 0.34 - 1.24 mg/dL 7.42   59.56   Sodium 387 - 145 mmol/L 135   136   Potassium 3.5 - 5.1 mmol/L 4.7  4.6  5.9   Chloride 98 - 111 mmol/L 97   98   CO2 22 - 32 mmol/L 24   23   Calcium 8.9 - 10.3 mg/dL 9.5   56.4     Assessment/Plan:   Hyperkalemia Hyperphosphatemia ESRD on HD MWF Potassium stable at 4.7 today after dialysis yesterday. Per nephrology, will not need HD today. Also messaged TOC about talking to his mother about the possibility of moving apartments, as the stairs leading out of his current home are a barrier to him getting to his dialysis appointments. This may not be possible, but TOC is helping to try to get a motorized wheelchair to get him to his dialysis sessions. Appreciate assistance - HD per nephrology. No HD today - Trend BMP  Compressive neuropathy Lower extremity pain Chronic lower extremity neuropathy continues to be his greatest complaint. He took off the fentanyl patch because it was making him too groggy. This is also the  greatest barrier to him participating in CIR therapy.  - Increasing Oxy 10mg  to q4h - PT will see today, determine whether patient can participate in CIR vs SNF at discharge  Chronic/resolved issues Permanent A-fib HFrEF History of MSSA bacteremia Hypertension Chronic hypoxic respiratory failure 2/2 amiodarone toxicity Anemia of chronic disease Macrocytic anemia Hyperglycemia Decubitus ulcers -For these issues we will continue his home medications and monitor blood pressure, oxygen requirements, blood counts, CBG, and frequent wound care  Diet: Renal IVF: None VTE: DOAC Code: Full PT/OT recs: Pending.  Dispo: Anticipated discharge to CIR vs SNF vs home in 1-2 days pending continued improvement after dialysis and ensured good transport plan for dialysis outpatient.   Monna Fam, MD Internal Medicine Resident PGY-1 Pager: [phone]  Please contact the on call pager  after 5 pm and on weekends at [phone].

## 2023-07-04 NOTE — Progress Notes (Signed)
This chaplain responded to the MD consult for spiritual care in the setting of prayer and making suicidal comments.  This Pt. accepted the chaplain's presence and assistance with rearranging his blankets.   The chaplain listened reflectively to the Pt. events of the day and questions to himself about quality of life. The chaplain understands the Pt. often thinks about living and dying. The Pt. thoughts about dying come more often when he is in pain; right now "I want to live." The Pt. is appreciative of Palliative Medicine's support in the space of matching the Pt. next steps in his medical care with his identity.   The Pt. faith lifts up God in creation and God's role as the Pt. constant companion. The chaplain understands the Pt. will continue to look to God and his family in his decisions.  The Pt is open to F/U spiritual care and intercessory prayer.  Chaplain Stephanie Acre [phone]

## 2023-07-05 ENCOUNTER — Other Ambulatory Visit (HOSPITAL_COMMUNITY): Payer: Self-pay

## 2023-07-05 DIAGNOSIS — R531 Weakness: Secondary | ICD-10-CM | POA: Diagnosis not present

## 2023-07-05 DIAGNOSIS — N186 End stage renal disease: Secondary | ICD-10-CM | POA: Diagnosis not present

## 2023-07-05 DIAGNOSIS — Z743 Need for continuous supervision: Secondary | ICD-10-CM | POA: Diagnosis not present

## 2023-07-05 DIAGNOSIS — Z992 Dependence on renal dialysis: Secondary | ICD-10-CM | POA: Diagnosis not present

## 2023-07-05 LAB — BASIC METABOLIC PANEL
Anion gap: 16 — ABNORMAL HIGH (ref 5–15)
BUN: 34 mg/dL — ABNORMAL HIGH (ref 6–20)
CO2: 24 mmol/L (ref 22–32)
Calcium: 10.4 mg/dL — ABNORMAL HIGH (ref 8.9–10.3)
Chloride: 97 mmol/L — ABNORMAL LOW (ref 98–111)
Creatinine, Ser: 8.81 mg/dL — ABNORMAL HIGH (ref 0.61–1.24)
GFR, Estimated: 7 mL/min — ABNORMAL LOW (ref >60)
Glucose, Bld: 87 mg/dL (ref 70–99)
Potassium: 5.2 mmol/L — ABNORMAL HIGH (ref 3.5–5.1)
Sodium: 137 mmol/L (ref 135–145)

## 2023-07-05 LAB — CBC
HCT: 33.4 % — ABNORMAL LOW (ref 39.0–52.0)
Hemoglobin: 10.6 g/dL — ABNORMAL LOW (ref 13.0–17.0)
MCH: 31.6 pg (ref 26.0–34.0)
MCHC: 31.7 g/dL (ref 30.0–36.0)
MCV: 99.7 fL (ref 80.0–100.0)
Platelets: 225 10*3/uL (ref 150–400)
RBC: 3.35 MIL/uL — ABNORMAL LOW (ref 4.22–5.81)
RDW: 14.7 % (ref 11.5–15.5)
WBC: 5.6 10*3/uL (ref 4.0–10.5)
nRBC: 0 % (ref 0.0–0.2)

## 2023-07-05 LAB — HEPATITIS B SURFACE ANTIBODY, QUANTITATIVE: Hep B S AB Quant (Post): 67.7 m[IU]/mL

## 2023-07-05 MED ORDER — ALTEPLASE 2 MG IJ SOLR: 2.0000 mg | Freq: Once | INTRAMUSCULAR | Status: DC | PRN

## 2023-07-05 MED ORDER — LIDOCAINE-PRILOCAINE 2.5-2.5 % EX CREA: 1.0000 | TOPICAL_CREAM | CUTANEOUS | Status: DC | PRN

## 2023-07-05 MED ORDER — ANTICOAGULANT SODIUM CITRATE 4% (200MG/5ML) IV SOLN
5.0000 mL | Status: DC | PRN
  Administered 2023-07-05: 5 mL
  Filled 2023-07-05 (×3): qty 5

## 2023-07-05 MED ORDER — NEPRO/CARBSTEADY PO LIQD: 237.0000 mL | ORAL | Status: DC | PRN

## 2023-07-05 MED ORDER — NEPRO/CARBSTEADY PO LIQD
237.0000 mL | ORAL | Status: DC | PRN
  Administered 2023-07-05: 237 mL via ORAL

## 2023-07-05 MED ORDER — LANTHANUM CARBONATE 1000 MG PO CHEW: 1000.0000 mg | CHEWABLE_TABLET | Freq: Three times a day (TID) | ORAL | 1 refills | Status: DC

## 2023-07-05 MED ORDER — LIDOCAINE HCL (PF) 1 % IJ SOLN: 5.0000 mL | INTRAMUSCULAR | Status: DC | PRN

## 2023-07-05 MED ORDER — PENTAFLUOROPROP-TETRAFLUOROETH EX AERO: 1.0000 | INHALATION_SPRAY | CUTANEOUS | Status: DC | PRN

## 2023-07-05 MED ORDER — OXYCODONE HCL 10 MG PO TABS
10.0000 mg | ORAL_TABLET | ORAL | 0 refills | Status: DC | PRN
  Filled 2023-07-05: qty 30, 5d supply, fill #0

## 2023-07-05 MED ORDER — CEFAZOLIN SODIUM-DEXTROSE 2-4 GM/100ML-% IV SOLN
2.0000 g | INTRAVENOUS | Status: DC
Start: 2023-07-05 — End: 2023-07-09

## 2023-07-05 MED ORDER — LANTHANUM CARBONATE 1000 MG PO CHEW
1000.0000 mg | CHEWABLE_TABLET | Freq: Three times a day (TID) | ORAL | 1 refills | Status: DC
  Filled 2023-07-05: qty 30, 10d supply, fill #0

## 2023-07-05 MED ORDER — ANTICOAGULANT SODIUM CITRATE 4% (200MG/5ML) IV SOLN: 5.0000 mL | Status: DC | PRN

## 2023-07-05 MED ORDER — OXYCODONE HCL 10 MG PO TABS: 10.0000 mg | ORAL_TABLET | ORAL | 0 refills | Status: DC | PRN

## 2023-07-05 MED ORDER — SODIUM ZIRCONIUM CYCLOSILICATE 10 G PO PACK: 10.0000 g | PACK | Freq: Every day | ORAL | Status: DC

## 2023-07-05 NOTE — TOC Transition Note (Signed)
Transition of Care (TOC) - CM/SW Discharge Note Donn Pierini RN, BSN Transitions of Care Unit 4E- RN Case Manager See Treatment Team for direct phone #   Patient Details  Name: Phillip Young MRN: [id number] Date of Birth: Mar 02, 1986  Transition of Care [facility]) CM/SW Contact:  Darrold Span, RN Phone Number: 07/05/2023, 4:25 PM   Clinical Narrative:    Pt stable for transition home, CM in to speak with pt at bedside- pt voicing he does not feel well- requested bedside RN to come- 02 checked as well as vitals- pt remains stable.   Mom on phone via speaker- discussed meds for discharge- one med has been sent to [facility], other (pain med) has been sent to hospital [facility] pharmacy. Mom voiced understanding.  CM also updated mom on electric wheelchair request and that referral has been sent to [employer] to follow up on.   Mom provided correct address for transport- pt will transport via EMS.  Per mom pt has wheelchair at home that he received in Sept. Mom voiced they have needed DME at home.   [facility] called for transport- per dispatch ETA for transport is several hours.  Paperwork placed on chart.   No further TOC needs noted.    Final next level of care: Home/Self Care Barriers to Discharge: Barriers Resolved   Patient Goals and CMS Choice   Choice offered to / list presented to : Parent  Discharge Placement               Home          Discharge Plan and Services Additional resources added to the After Visit Summary for   In-house Referral: Clinical Social Work Discharge Planning Services: CM Consult Post Acute Care Choice: Durable Medical Equipment          DME Arranged: Other see comment Glass blower/designer wheelchair) DME Agency: [employer] Date DME Agency Contacted: 07/05/23 Time DME Agency Contacted: 1150 Representative spoke with at DME Agency: Mitch HH Arranged: NA HH Agency: NA        Social Determinants of Health (SDOH) Interventions SDOH Screenings   Food  Insecurity: No Food Insecurity (07/02/2023)  Housing: Low Risk  (07/02/2023)  Transportation Needs: Unmet Transportation Needs (07/02/2023)  Utilities: Not At Risk (07/02/2023)  Alcohol Screen: Low Risk  (09/21/2022)  Depression (PHQ2-9): High Risk (06/21/2023)  Financial Resource Strain: Low Risk  (03/24/2023)   Received from [facility]  Physical Activity: Inactive (09/21/2022)  Social Connections: Moderately Isolated (03/24/2023)   Received from [facility]  Stress: No Stress Concern Present (04/21/2023)   Received from [facility]  Tobacco Use: Medium Risk (07/01/2023)     Readmission Risk Interventions    07/05/2023    4:25 PM 05/24/2023    4:43 PM 03/23/2023    3:48 PM  Readmission Risk Prevention Plan  Transportation Screening Complete Complete Complete  PCP or Specialist Appt within 5-7 Days  Complete   Home Care Screening  Complete   Medication Review (RN CM)  Referral to Pharmacy   HRI or Home Care Consult Complete    Social Work Consult for Recovery Care Planning/Counseling Complete    Palliative Care Screening Not Applicable    Medication Review [employer]) Complete  Complete  PCP or Specialist appointment within 3-5 days of discharge   --  HRI or Home Care Consult   Complete  SW Recovery Care/Counseling Consult   Complete  Palliative Care Screening   Not Applicable  Skilled Nursing Facility   Not  Applicable

## 2023-07-05 NOTE — Progress Notes (Signed)
Inpatient Rehab Admissions Coordinator:   Per therapy recommendations,  patient was screened for CIR candidacy by Megan Salon, MS, CCC-SLP. I discussed case with rehab MD and CIR therapists and SW team as pt. Was recently on CIR. Due to the fact that Pt. Is not far from baseline, had reduced participation during recent admission, and has limited caregiver support, decision was made not to pursue a CIR admission at this time. He will need other rehab venue.  Please contact me with any reason.  Megan Salon, MS, CCC-SLP Rehab Admissions Coordinator  [phone] (celll) [phone] (office)

## 2023-07-05 NOTE — Discharge Summary (Signed)
Name: Phillip Young MRN: [id number] DOB: Nov 29, 1985 37 y.o. PCP: [name], DO  Date of Admission: 07/01/2023  9:27 PM Date of Discharge: 07/05/23 Attending Physician: Dr. 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   cholecalciferol 10 MCG (400 UNIT) Tabs tablet Commonly known as: VITAMIN D3 Take 1 tablet (400 Units total) by mouth daily.   Darbepoetin Alfa 150 MCG/0.3ML Sosy injection Commonly known as: ARANESP Inject 0.3 mLs (150 mcg total) into the skin every Saturday at 6 PM.   digoxin 0.125 MG tablet Commonly known as: LANOXIN Take 1 tablet (0.125 mg total) by mouth 3 (three) times a week. What changed: additional instructions   Gerhardt's butt cream Crea Apply 1 Application topically 3 (three) times daily.   lanthanum 1000 MG chewable tablet Commonly known as: FOSRENOL Chew 1 tablet (1,000 mg total) by mouth 3 (three) times daily with meals. What changed: how much to take   leptospermum manuka honey Pste paste Apply 1 Application topically daily.   levothyroxine 25 MCG  tablet Commonly known as: SYNTHROID Take 1 tablet (25 mcg total) by mouth daily at 6 (six) AM.   lidocaine 5 % ointment Commonly known as: XYLOCAINE Apply 1 Application topically as needed.   midodrine 10 MG tablet Commonly known as: PROAMATINE Take 3 tablets (30 mg total) by mouth 3 (three) times daily with meals.   ondansetron 4 MG tablet Commonly known as: Zofran Take 1 tablet (4 mg total) by mouth daily as needed for nausea or vomiting.   Oxycodone HCl 10 MG Tabs Take 1 tablet (10 mg total) by mouth every 4 (four) hours as needed for severe pain (pain score 7-10) or breakthrough pain. What changed:  how much to take when to take this reasons to take this   pregabalin 50 MG capsule Commonly known as: LYRICA Take one capsule daily and every Tuesday, Thursday and Saturday with dialysis (on each dialysis day). What changed: additional instructions               Durable Medical Equipment  (From admission, onward)           Start     Ordered   07/05/23 1146  For home use only DME Other see comment  Once       Comments: Your health makes it very hard to move around in your home, even with the help of a walker or cane It is difficult for you to perform activities of daily living (  such as bathing and dressing) in your home You cannot use a manual wheelchair or scooter but can safely use a power wheelchair or scooter The wheelchair will help with a specific medical condition or injury and be used in the home And, you had a face-to-face meeting with the doctor on 07/05/23  Question:  Length of Need  Answer:  Lifetime   07/05/23 1147            Disposition and follow-up:   Mr.[name] was discharged from [facility] in Stable condition.  At the hospital follow up visit please address:  1.  Follow-up:  a. ESRD: patient's main reason for admission was difficulty getting to his dialysis sessions because of inability to walk up stairs or a  hill. Social work has started process to get a motorized wheelchair, will need to complete form    b. Neuropathy: severe bilateral lower extremity neuropathy limiting his ability to walk and travel to dialysis. On pregabalin and Oxy 10mg  q4h for 30 doses at discharge. Will need a pain contract. Was previously prescribed venlafaxine but never picked up  2.  Labs / imaging needed at time of follow-up: None  3.  Pending labs/ test needing follow-up: None  4.  Medication Changes  Changed: Lanthanum to 1000mg  TID, Oxy 10mg  q4h prn for 30 doses  Follow-up Appointments:  Follow-up Information     [facility] Follow up.   Why: ([employer])- request for electric/motorized wheelchair sent- they will follow up on order with PCP and any further documentation needed. Contact information: [address] [place] [address] [phone]               [facility] 07/07/23 at 1:15 with Dr [facility] Course by problem list:    #Hyperkalemia  #Hyperphosphatemia #ESRD on HD MWF Patient presented to the ED after missing two dialysis sessions, found to have an elevated potassium of 7.5 and creatinine 13.6. Patient appeared euvolemic on exam, using 3L nasal cannula which is the same amount he uses at home. Received insulin and lokelma. No changes on EKG compared to prior. Nephrology was consulted for urgent dialysis, which he completed. Patient received another session of HD on 12/8 for recurrent hyperkalemia. On 12/10 patient was again hyperkalemic to 5.2 with creatinine 8.8. He appears at his baseline, with hyperkalemia and other electrolyte derangements able to be managed on regular outpatient dialysis. His main difficulty at home is the ability to get to his dialysis appointments. At his current living situation he either has to walk up stairs or down a hill, which he sometimes cannot do without assistance. We have started the process to get him a motorized wheelchair, which should be  helpful for him getting regular HD.    #AFib EKG this admission shows minimal amount of P waves, on eliquis 5mg  BID. He is refractory to cardioversion x3. Rate control has been difficult given amiodarone toxicity and low cardiac output to begin with which makes beta blockers, and diltiazem poor options. He takes digoxin .125mg  on dialysis days, which was continued throughout admission. Patient was hemodynamically stable throughout his admission.    #HFrEF GDMT limited by chronic hypotension. Last EF 30-35% in October 2024 with moderately decreased LV function and global hypokinesia. Internal cavity size severely dilated. Volume is managed with dialysis. Patient appeared euvolemic at admission, though exam was limited by his body habitus. He maintained appropriate O2 sats on his home level of oxygen. Chest xray did not  show increased pulmonary vascular congestion. Volume status was managed by HD during admission.    #Compressive Neuropathy Patient with history of neuropathy after ICU admission where he required intubation and pressor therapy for almost 30 days for cardiogenic and septic shock. Physical exam is very limited by severe pain. He uses pregabalin at home which is somewhat helpful in managing his pain. His ability to tolerate ESRD is also limited by his neuropathic foot pain. Pain was managed with Oxycodone 10mg  q4h prn while admitted.    #Hx of MSSA Bacteremia Found on recent admission in late October, recommendations were to continue Ancef with Dialysis on 12/11. Reordered.   #Hypotension On midodrine 30mg  TID, continued through admission though patient refused multiple doses.    #Chronic Hypoxic Respiratory Failure 2/2 Amiodarone Toxicity  On his home 3L saturating at 99%.    #Anemia of Chronic Disease #Macrocytic Anemia (mild) Hb currently at 9.3, likely in the setting of multiple chronic severe conditions. Hemoglobin stable throughout admission.    #Decubitus Ulcer  Noted per  chart review as well as on CT scan. Pt states that he feels like his wound has gotten better.    Discharge Subjective: Patient feeling very well today. Appears at his baseline level of health and activity. Informed him he was not deemed appropriate for CIR, and would be able to be discharged today. Patient amenable to plan. We are working on getting him a motorized wheelchair to help him get to outpatient dialysis sessions.   Discharge Exam:   BP (!) 110/58 (BP Location: Left Wrist)   Pulse (!) 108   Temp 98.8 F (37.1 C) (Oral)   Resp 16   Ht 6' (1.829 m)   Wt (!) 157.8 kg   SpO2 94%   BMI 47.18 kg/m  Constitutional: obese, in no acute distress HENT: normocephalic atraumatic, mucous membranes moist Eyes: conjunctiva non-erythematous Neck: supple Cardiovascular: regular rate and rhythm, no m/r/g Pulmonary/Chest: normal work of breathing on room air, lungs clear to auscultation bilaterally. Exam limited by body habitus Abdominal: soft, non-tender, non-distended MSK: normal bulk and tone Neurological: alert & oriented x 3, 5/5 strength in bilateral upper and lower extremities, normal gait Skin: warm and dry  Pertinent Labs, Studies, and Procedures:     Latest Ref Rng & Units 07/05/2023    3:16 AM 07/04/2023    3:09 AM 07/03/2023    3:37 AM  CBC  WBC 4.0 - 10.5 K/uL 5.6  5.8  5.4   Hemoglobin 13.0 - 17.0 g/dL 46.9  9.6  9.2   Hematocrit 39.0 - 52.0 % 33.4  29.9  29.2   Platelets 150 - 400 K/uL 225  219  220        Latest Ref Rng & Units 07/05/2023    3:16 AM 07/04/2023    3:09 AM 07/04/2023   12:16 AM  CMP  Glucose 70 - 99 mg/dL 87  90    BUN 6 - 20 mg/dL 34  26    Creatinine 6.29 - 1.24 mg/dL 5.28  4.13    Sodium 244 - 145 mmol/L 137  135    Potassium 3.5 - 5.1 mmol/L 5.2  4.7  4.6   Chloride 98 - 111 mmol/L 97  97    CO2 22 - 32 mmol/L 24  24    Calcium 8.9 - 10.3 mg/dL 01.0  9.5      DG Chest Portable 1 View  Result Date: 07/01/2023 CLINICAL DATA:  End-stage  renal disease, missed  dialysis EXAM: PORTABLE CHEST 1 VIEW COMPARISON:  06/15/2023 FINDINGS: 2 frontal views of the chest demonstrate right internal jugular dialysis catheter unchanged. Stable enlarged cardiac silhouette. No airspace disease, effusion, or pneumothorax. No acute bony abnormalities. IMPRESSION: 1. Stable enlarged cardiac silhouette.  No acute airspace disease. Electronically Signed   By: Sharlet Salina M.D.   On: 07/01/2023 22:59   CT PELVIS WO CONTRAST  Result Date: 07/01/2023 CLINICAL DATA:  History of chronic sacral wounds assess for deep bony involvement EXAM: CT PELVIS WITHOUT CONTRAST TECHNIQUE: Multidetector CT imaging of the pelvis was performed following the standard protocol without intravenous contrast. RADIATION DOSE REDUCTION: This exam was performed according to the departmental dose-optimization program which includes automated exposure control, adjustment of the mA and/or kV according to patient size and/or use of iterative reconstruction technique. COMPARISON:  04/19/2021 FINDINGS: Urinary Tract: Visualized portions of the right kidney are within normal limits. No obstructive changes are seen. The bladder is decompressed. Bowel: Mild diverticular change of the colon is noted. No evidence of diverticulitis is seen. The appendix is within normal limits. Visualized small bowel is unremarkable. Vascular/Lymphatic: Mild atherosclerotic calcifications of the iliac vessels are seen. No lymphadenopathy is noted. Reproductive:  Prostate is within normal limits. Other:  None. Musculoskeletal: There are significant inflammatory changes identified posterior to the sacrum in the subcutaneous fatty tissue without discrete abscess identified. Focal defect is noted just to the right of the midline adjacent to the inflammatory change which may represent a focal decubitus ulcer. No definitive bony involvement is seen. No extension to the bony structures of the sacrum is identified. IMPRESSION:  Inflammatory changes in the subcutaneous tissues over the sacrum consistent with the given clinical history. No discrete abscess is noted. A questionable decubitus ulcer is noted to the right of the midline. No discrete bony involvement is noted. Electronically Signed   By: Alcide Clever M.D.   On: 07/01/2023 22:55     Discharge Instructions: Discharge Instructions     Ambulatory referral to Pain Clinic   Complete by: As directed        Signed: Monna Fam, MD PGY-1 07/05/2023, 1:39 PM   Pager: [phone]

## 2023-07-05 NOTE — Discharge Instructions (Addendum)
[name], Phillip Young were hospitalized for hyperkalemia related to missing dialysis. You have received multiple sessions of dialysis, and I believe are now ready to be discharged home with continued outpatient HD. Thank you for allowing [place] to be part of your care.   We arranged for you to follow up at:  [facility], 07/07/23 at 1:15 with Dr Ninfa Meeker  Please note these changes made to your medications:  Lanthanum 1000mg  three times per day Oxycodone 10mg  every 4 hours as needed for severe pain  *Please STOP taking:  Cefazolin  Please make sure to return to the hospital if you have shortness of breath, a fever, changes in how your heart is beating.   Please call our clinic if you have any questions or concerns, we may be able to help and keep you from a long and expensive emergency room wait. Our clinic and after hours phone number is [phone], the best time to call is Monday through Friday 9 am to 4 pm but there is always someone available 24/7 if you have an emergency. If you need medication refills please notify your pharmacy one week in advance and they will send [place] a request.

## 2023-07-05 NOTE — Progress Notes (Signed)
Per CM Kristi request rechecked patient vital signs.  98.5 oral 127/90, 90, 20, 100% [place] 3 liters. Initially, patient anxious upon entry to room but asked patient to try and relax. Asked patient if he was anxious about going home and he said "no, I want to go home". Explained that vital signs were stable. CM Kristi and discharge [employer] in room. Pt resting with call bell within reach.  Will continue to monitor.

## 2023-07-05 NOTE — TOC Progression Note (Signed)
Transition of Care (TOC) - Progression Note  Donn Pierini RN, BSN Transitions of Care Unit 4E- RN Case Manager See Treatment Team for direct phone #   Patient Details  Name: Phillip Young MRN: [id number] Date of Birth: 1986/04/10  Transition of Care [facility]) CM/SW Contact  Zenda Alpers, Lenn Sink, RN Phone Number: 07/05/2023, 1:45 PM  Clinical Narrative:    CM received msg from attending team regarding electric wheelchair for pt- this DME is not provided while pt is in hospital and explained that pt will need to f/u with PCP for order and needed documentation.  PCP part of the attending team and team wants to see if order can be started while pt is in hospital.  Info given to Dr. Carlynn Purl regarding needed info  The order should say the following:  Your health makes it very hard to move around in your home, even with the help of a walker or cane It is difficult for you to perform activities of daily living (such as bathing and dressing) in your home You cannot use a manual wheelchair or scooter but can safely use a power wheelchair or scooter The wheelchair will help with a specific medical condition or injury and be used in the home And, you had a face-to-face meeting with the doctor This meeting should take place no more than 45 days before the prescription is written.   Order placed and CM contacted liaison for [employer]- per Marthann Schiller order has to go through different division of [employer] that handles electric wheelchairs on an outpt basis- per Mitch order can not be processed by the INPT team. Mitch to forward order to the appropriate division who will follow up on order and contact PCP for any further information that may be needed.   TOC will continue to follow for further transition needs.    Expected Discharge Plan: Home w Home Health Services Barriers to Discharge: Continued Medical Work up  Expected Discharge Plan and Services In-house Referral: Clinical Social Work Discharge Planning  Services: CM Consult Post Acute Care Choice: Durable Medical Equipment Living arrangements for the past 2 months: Single Family Home                 DME Arranged: Other see comment Glass blower/designer wheelchair) DME Agency: [employer] Date DME Agency Contacted: 07/05/23 Time DME Agency Contacted: 1150 Representative spoke with at DME Agency: Marthann Schiller             Social Determinants of Health (SDOH) Interventions SDOH Screenings   Food Insecurity: No Food Insecurity (07/02/2023)  Housing: Low Risk  (07/02/2023)  Transportation Needs: Unmet Transportation Needs (07/02/2023)  Utilities: Not At Risk (07/02/2023)  Alcohol Screen: Low Risk  (09/21/2022)  Depression (PHQ2-9): High Risk (06/21/2023)  Financial Resource Strain: Low Risk  (03/24/2023)   Received from [facility]  Physical Activity: Inactive (09/21/2022)  Social Connections: Moderately Isolated (03/24/2023)   Received from [facility]  Stress: No Stress Concern Present (04/21/2023)   Received from [facility]  Tobacco Use: Medium Risk (07/01/2023)    Readmission Risk Interventions    05/24/2023    4:43 PM 03/23/2023    3:48 PM 06/09/2022   12:19 PM  Readmission Risk Prevention Plan  Transportation Screening Complete Complete Complete  PCP or Specialist Appt within 5-7 Days Complete    Home Care Screening Complete    Medication Review (RN CM) Referral to Pharmacy    Medication Review (RN Care Manager)  Complete Complete  PCP or Specialist appointment within 3-5 days  of discharge  -- Complete  HRI or Home Care Consult  Complete Complete  SW Recovery Care/Counseling Consult  Complete Complete  Palliative Care Screening  Not Applicable Not Applicable  Skilled Nursing Facility  Not Applicable Not Applicable

## 2023-07-05 NOTE — Progress Notes (Signed)
Discharge instructions (including medications) discussed with and copy provided to patient/caregiver  [facility] is picking patient up at 6:30 pm

## 2023-07-05 NOTE — Progress Notes (Signed)
HD#2 Subjective:   Summary: Phillip Young is a 37 y.o. male with pertinent PMH of ESRD on HD MWF, chronic hypoxic respiratory failure 2/2 suspected amiodarone toxicity, persistent atrial fibrillation, HFrEF who presented with increasing pain and is admitted for hyperkalemia.   Patient feeling very well today. He hopes to go home today if possible. Discussed his need for a motorized wheelchair to help him get to dialysis going forward.   Objective:  Vital signs in last 24 hours: Vitals:   07/04/23 1219 07/04/23 1222 07/04/23 1613 07/04/23 1927  BP: (!) 132/109 (!) 135/111 111/71 104/70  Pulse: (!) 111 (!) 118 75 95  Resp: 16 (!) 21 19 19   Temp: 98.5 F (36.9 C) 98.5 F (36.9 C) 98.1 F (36.7 C) 98.1 F (36.7 C)  TempSrc:   Oral Oral  SpO2: 100% 100% 96% 90%  Weight:      Height:       Supplemental O2: Nasal Cannula SpO2: 90 % O2 Flow Rate (L/min): 3 L/min   Physical Exam:  Constitutional: Chronically ill-appearing obese male Cardiovascular: Irregularly irregular rhythm, tachycardic Pulmonary/Chest:no significant crackles or focal lung sounds on anterior and lateral lung fields MSK: Pitting edema in the lower extremities bilaterally and significant pain to palpation in bilateral lower extremities below the knees without overlying skin changes, wounds, or other abnormalities Neurological: alert & oriented x 3  Pertinent Labs:    Latest Ref Rng & Units 07/05/2023    3:16 AM 07/04/2023    3:09 AM 07/03/2023    3:37 AM  CBC  WBC 4.0 - 10.5 K/uL 5.6  5.8  5.4   Hemoglobin 13.0 - 17.0 g/dL 46.9  9.6  9.2   Hematocrit 39.0 - 52.0 % 33.4  29.9  29.2   Platelets 150 - 400 K/uL 225  219  220        Latest Ref Rng & Units 07/05/2023    3:16 AM 07/04/2023    3:09 AM 07/04/2023   12:16 AM  CMP  Glucose 70 - 99 mg/dL 87  90    BUN 6 - 20 mg/dL 34  26    Creatinine 6.29 - 1.24 mg/dL 5.28  4.13    Sodium 244 - 145 mmol/L 137  135    Potassium 3.5 - 5.1 mmol/L 5.2  4.7  4.6    Chloride 98 - 111 mmol/L 97  97    CO2 22 - 32 mmol/L 24  24    Calcium 8.9 - 10.3 mg/dL 01.0  9.5      Assessment/Plan:   Hyperkalemia Hyperphosphatemia ESRD on HD MWF Hyperkalemic at 5.2, Cr 8.8. HD today per nephrology. Working with social work to get a motorized wheelchair to help him access outpatient dialysis.  - HD per nephrology. HD today - Trend BMP  Compressive neuropathy Lower extremity pain Chronic lower extremity neuropathy continues to be his greatest complaint. He was able to participate in PT and OT yesterday who are both recommending CIR placement.  - Oxy 10mg  q4h prn, home pregabalin - Messaged CIR coordinator about evaluating for inpatient rehab placement  Chronic/resolved issues Permanent A-fib HFrEF History of MSSA bacteremia Hypertension Chronic hypoxic respiratory failure 2/2 amiodarone toxicity Anemia of chronic disease Macrocytic anemia Hyperglycemia Decubitus ulcers -For these issues we will continue his home medications and monitor blood pressure, oxygen requirements, blood counts, CBG, and frequent wound care  Diet: Renal IVF: None VTE: DOAC Code: Full PT/OT recs: Pending.  Dispo: Anticipated discharge to CIR vs  SNF vs home in 1-2 days pending continued improvement after dialysis and ensured good transport plan for dialysis outpatient.   Monna Fam, MD Internal Medicine Resident PGY-1 Pager: [phone]  Please contact the on call pager after 5 pm and on weekends at [phone].

## 2023-07-05 NOTE — Progress Notes (Signed)
Patient discharge to home via [facility]AR. Medication from [facility] given to the patient witnessed by [facility] transport staff.

## 2023-07-05 NOTE — Consult Note (Signed)
WOC Nurse Consult Note: Reason for Consult: Consult requested for buttocks and posterior thighs.  Pt is familiar to [facility] team from previous admission when he had extensive wounds to left posterior thigh and bilat buttocks/sacrum.  All locations have healed and are pink dry intact scar tissue, except left buttock.   Healing Stage 3 pressure injury; .2X.2X.2cm, red and dry, located in a slight valley of skin.  Pressure Injury POA: Yes Dressing procedure/placement/frequency: Topical treatment orders provided for bedside nurses to perform as follows: Foam dressing to right buttock, change Q 3 days or PRN soiling. Please re-consult if further assistance is needed.  Thank-you,  Cammie Mcgee MSN, RN, CWOCN, [place], CNS [phone]

## 2023-07-05 NOTE — Progress Notes (Signed)
   07/05/23 1311  Vitals  Temp 98.8 F (37.1 C)  Pulse Rate (!) 108  Resp 16  BP (!) 110/58  SpO2 94 %  O2 Device Nasal Cannula  During Treatment Monitoring  Blood Flow Rate (mL/min) 400 mL/min  Arterial Pressure (mmHg) -11.71 mmHg  Venous Pressure (mmHg) 160.8 mmHg  TMP (mmHg) 3.43 mmHg  Ultrafiltration Rate (mL/min) 0 mL/min  Dialysate Flow Rate (mL/min) 299 ml/min  Duration of HD Treatment -hour(s) 3.47 hour(s)  Cumulative Fluid Removed (mL) per Treatment  1821.24  HD Safety Checks Performed Yes  Intra-Hemodialysis Comments Tx completed (pt denies complaint)   Received patient in bed to unit.  Alert and oriented.  Informed consent signed and in chart.   TX duration:3.5  Patient tolerated well.  Transported back to the room  Alert, without acute distress.  Hand-off given to patient's nurse.   Access used: Yes Access issues: No  Total UF removed: 1800 Medication(s) given: See MAR Post HD VS: See Above Grid Post HD weight: 157.8 kg   [facility]

## 2023-07-05 NOTE — Progress Notes (Signed)
[facility] Progress Note   Subjective:   Pt seen on HD, reports he was "feeling bad," so out of UF. Reports mild SOB today. He is in a.fib, reports this is chronic. Denies HA, dizziness, nausea.   Objective Vitals:   07/05/23 0946 07/05/23 1005 07/05/23 1035 07/05/23 1100  BP: 98/68 (!) 154/78 94/62 98/60   Pulse: 98 (!) 104 (!) 101 (!) 106  Resp: 20 20 18  (!) 21  Temp:      TempSrc:      SpO2: 98% 98% 98% 98%  Weight:      Height:       Physical Exam General: Alert male in NAD Heart: Slightly tachycardic, irregularly irregular, no murmur Lungs: Lungs CTA bilaterally, respirations unlabored on RA Abdomen: Soft, non-distended, +BS  Extremities: No edema b/l lower extremities Dialysis Access:  [facility]  Additional Objective Labs: Basic Metabolic Panel: Recent Labs  Lab 07/02/23 0637 07/03/23 0337 07/03/23 1148 07/04/23 0016 07/04/23 0309 07/05/23 0316  NA 136 134* 136  --  135 137  K 5.0 6.7* 5.9* 4.6 4.7 5.2*  CL 98 96* 98  --  97* 97*  CO2 25 24 23   --  24 24  GLUCOSE 72 78 101*  --  90 87  BUN 38* 40* 44*  --  26* 34*  CREATININE 8.36* 9.43* 10.20*  --  6.95* 8.81*  CALCIUM 9.4 10.0 10.1  --  9.5 10.4*  PHOS 5.7* 8.5*  --   --  5.7*  --    Liver Function Tests: Recent Labs  Lab 07/01/23 2208 07/02/23 0637 07/03/23 0337 07/04/23 0309  AST 28  --   --   --   ALT 6  --   --   --   ALKPHOS 87  --   --   --   BILITOT 1.0  --   --   --   PROT 7.0  --   --   --   ALBUMIN 3.1* 3.3* 3.2* 3.1*   No results for input(s): "LIPASE", "AMYLASE" in the last 168 hours. CBC: Recent Labs  Lab 07/01/23 2208 07/02/23 0627 07/03/23 0337 07/04/23 0309 07/05/23 0316  WBC 5.9 4.9 5.4 5.8 5.6  NEUTROABS 4.1  --   --   --   --   HGB 9.3* 9.0* 9.2* 9.6* 10.6*  HCT 29.7* 27.9* 29.2* 29.9* 33.4*  MCV 100.7* 99.3 100.3* 97.7 99.7  PLT 211 217 220 219 225   Blood Culture    Component Value Date/Time   SDES BLOOD BLOOD RIGHT HAND 05/27/2023 0959    SPECREQUEST  05/27/2023 0959    BOTTLES DRAWN AEROBIC AND ANAEROBIC Blood Culture adequate volume   CULT  05/27/2023 0959    NO GROWTH 5 DAYS Performed at [facility], [address], [place], [place] [address]    REPTSTATUS 06/01/2023 FINAL 05/27/2023 3086    Cardiac Enzymes: No results for input(s): "CKTOTAL", "CKMB", "CKMBINDEX", "TROPONINI" in the last 168 hours. CBG: Recent Labs  Lab 07/02/23 0005 07/02/23 0048 07/02/23 0135 07/04/23 1217  GLUCAP 39* 79 103* 90   Iron Studies: No results for input(s): "IRON", "TIBC", "TRANSFERRIN", "FERRITIN" in the last 72 hours. @lablastinr3 @ Studies/Results: No results found. Medications:  anticoagulant sodium citrate     ceFAZolin      apixaban  5 mg Oral BID   Chlorhexidine Gluconate Cloth  6 each Topical Q0600   Chlorhexidine Gluconate Cloth  6 each Topical Q0600   Chlorhexidine Gluconate Cloth  6  each Topical Q0600   digoxin  0.125 mg Oral Once per day on Monday Wednesday Friday   lanthanum  1,000 mg Oral TID WC   levothyroxine  25 mcg Oral Q0600   midodrine  30 mg Oral TID WC   pregabalin  50 mg Oral Daily   sodium zirconium cyclosilicate  5 g Oral TID    Dialysis Orders:  [facility], MWF, 4.5 hr, 2 k2 ca, edw 155.5kg,  [facility] TDC, last OP HD on 12/2.  Cefazolin 2g every HD till 07/04/23.  Assessment/Plan:  # Severe hyperkalemia in the setting of missing dialysis: Status post urgent HD on 12/6 at night.  Underwent HD again 07/03/23 due to hyperkalemia. K+ 5.2 today. He is still on lokelma 5mg  TID, will change to 10mg  daily for now.    # ESRD MWF:   Patient said he missed outpatient dialysis because of the pain after HD and transportation issues. Typically on MWF schedule but had HD off schedule Sunday due to hyperkalemia. Off schedule for now, HD today and will reassess tomorrow.    # Anemia of ESRD: Hgb at goal, no ESA indicated at this time.    # CKD-MBD/hyperphosphatemia: Resume lanthanum and monitor lab. Calcium  elevated, not on VDRA.    # Hypotension/volume: On midodrine, blood pressure soft/stable. Does not appear volume overloaded on exam. Weight disparity per charting, follow trend   # MSSA bacteremia: Currently on cefazolin 2 g daily 12/9.  Rogers Blocker, PA-C 07/05/2023, 11:17 AM  [facility] Pager: [phone]

## 2023-07-06 NOTE — Discharge Planning (Signed)
[facility] Patient Discharge Orders- [facility] CLINIC: [facility]  Patient's name: Phillip Young Admit/DC Dates: 07/01/2023 - 07/05/2023  Discharge Diagnoses: Hyperkalemia   ESRD neuropathy  Aranesp: Given: no   Date and amount of last dose: N/A  Last Hgb: 10.6 PRBC's Given: no Date/# of units: N/A ESA dose for discharge:none  IV Iron dose at discharge: none  Heparin change: no- No heparin  EDW Change: No New EDW:   [place] Change: no  Access intervention/Change: no Details:  Hectorol/Calcitriol change: no  Discharge Labs: Calcium 10.4 Phosphorus 5.7 Albumin 3.1 K+ 5.2  IV Antibiotics: no- finished course of cefazolin Details:  On Coumadin?: no Last INR: Next INR: Managed By:   OTHER/APPTS/LAB ORDERS:    D/C Meds to be reconciled by nurse after every discharge.  Completed By: Rogers Blocker, PA-C 07/06/2023, 1:44 PM  [facility] Pager: [phone]    Reviewed by: MD:______ RN_______

## 2023-07-07 ENCOUNTER — Ambulatory Visit: Payer: Medicaid Other | Admitting: Student

## 2023-07-07 ENCOUNTER — Telehealth: Payer: Self-pay | Admitting: Physician Assistant

## 2023-07-07 VITALS — BP 105/65 | HR 75 | Temp 98.1°F | Wt 361.0 lb

## 2023-07-07 DIAGNOSIS — G629 Polyneuropathy, unspecified: Secondary | ICD-10-CM

## 2023-07-07 DIAGNOSIS — Z992 Dependence on renal dialysis: Secondary | ICD-10-CM | POA: Diagnosis not present

## 2023-07-07 DIAGNOSIS — R29898 Other symptoms and signs involving the musculoskeletal system: Secondary | ICD-10-CM | POA: Diagnosis not present

## 2023-07-07 DIAGNOSIS — Z79891 Long term (current) use of opiate analgesic: Secondary | ICD-10-CM

## 2023-07-07 DIAGNOSIS — N186 End stage renal disease: Secondary | ICD-10-CM

## 2023-07-07 DIAGNOSIS — R5381 Other malaise: Secondary | ICD-10-CM | POA: Diagnosis not present

## 2023-07-07 MED ORDER — VENLAFAXINE HCL ER 37.5 MG PO CP24: 37.5000 mg | ORAL_CAPSULE | Freq: Every day | ORAL | 2 refills | Status: DC

## 2023-07-07 MED ORDER — HYDROMORPHONE HCL 4 MG PO TABS: 4.0000 mg | ORAL_TABLET | Freq: Four times a day (QID) | ORAL | 0 refills | Status: DC | PRN

## 2023-07-07 NOTE — Progress Notes (Signed)
CC: HFU for ESRD hyperkalemia and for neuropathic pain  HPI:  [name] is a 37 y.o. male living with a history stated below and presents today for evaluation. Please see problem based assessment and plan for additional details.  Past Medical History:  Diagnosis Date   Acute on chronic respiratory failure with hypoxia ([name]) 04/21/2021   Acute on chronic systolic (congestive) heart failure ([name]) 02/26/2020   Amiodarone toxicity    Anemia    Atrial flutter ([name])    Biventricular congestive heart failure ([name])    Chronic hypoxemic respiratory failure ([name])    Class 3 severe obesity due to excess calories with serious comorbidity and body mass index (BMI) of 50.0 to 59.9 in adult [facility]) 02/26/2020   Essential hypertension 02/26/2020   GERD without esophagitis 02/26/2020   Hidradenitis suppurativa 02/26/2020   NICM (nonischemic cardiomyopathy) ([name])    Obesity hypoventilation syndrome ([name])    OSA (obstructive sleep apnea)    PAF (paroxysmal atrial fibrillation) ([name])    Pneumonia    Prediabetes 02/26/2020    Review of Systems: ROS negative except for what is noted on the assessment and plan.  Vitals:   07/07/23 1556  BP: 105/65  Pulse: 75  Temp: 98.1 F (36.7 C)  TempSrc: Oral  Weight: (!) 361 lb (163.7 kg)    Physical Exam: Constitutional: chronically ill-appearing man sitting in wheelchair, in no acute distress HENT: normocephalic atraumatic, mucous membranes moist Eyes: conjunctiva non-erythematous Cardiovascular: regular rate and rhythm, no m/r/g Pulmonary/Chest: normal work of breathing on 5L supplemental oxygen, lungs clear to auscultation bilaterally Abdominal: soft, non-tender, non-distended MSK: normal bulk and tone Neurological: alert & oriented x 3, no focal deficit. Severe but stable neuropathic pain of bilateral feet. Skin: warm and dry Psych: normal mood and behavior  Assessment & Plan:   Patient seen with Dr. Mayford Knife  Neuropathy This is  his chief complaint.  We have been working on pain medicine for this for a few months.  Neuropathy is severe in the bilateral lower extremities, due to compression/ICU neuropathy when he was intubated for many days due to severe pneumonia.  Currently it is best controlled with oxycodone, limited Lyrica, and some topical lotions.  Unfortunately his pain can be so severe that he is unable to leave his house and go to dialysis, which promotes recurrent hospital admissions.  Ideally in this ESRD patient he would not be on oxycodone.  He has been on Dilaudid in the past but does not feel it is as helpful.  He is however willing to trial return to Dilaudid, which would probably be a better long-term medicine for him given his kidney function. - Will stop his oxycodone which is 10 every 4 hours per his recent hospital discharge.  Will start a roughly equivalent regimen of Dilaudid.  Hydromorphone 4 mg every 6 hours.  Pain contract started today.  Unable to collect urine in this patient. - Will also start Effexor 37.5 daily.  Other neuropathic medicines such as Lyrica are limited to further up titration and due to his kidneys function, would prefer to avoid Cymbalta due to kidney function as well.  Severe muscle deconditioning He is markedly deconditioned due to his prolonged ICU and hospitalization December 2024, 2 months total, as well as his obesity and chronic health conditions.  This prevents him from walking more than a few strides across the room without assistance, he is unable to climb stairs, he is unable to walk up hills.  He completed inpatient  rehab to work on strengthening after hospitalization and October.  He is unfortunately reliant on family members to push him up a grassy hill or for presentation providers to drive their vehicle around the back of his apartment in order for him to safely leave the apartment and make it to dialysis.  His most recent discharge, the process is started to obtain a  motorized wheelchair to assist with making it to dialysis.  I believe this is very important to prevent further hospitalizations for emergent dialysis and further deterioration of his health.  Currently I do not have any paperwork to sign but understand that some should be sent to clinic shortly, I am receptive to these, and I will send him to neurorehab for PT evaluation to further this process.  ESRD needing dialysis [facility]) Since our visit 2 weeks ago he was hospitalized for hyperkalemia due to missing 2 episodes of dialysis.  He was unable to make it to dialysis due to his deconditioning, transportation challenges.  He was discharged in good condition.  He is set to go to dialysis tomorrow.  Hyperkalemia has resolved.  Weight today is 361.  Does not appear volume overloaded.  [name], D.O. [facility] Internal Medicine, PGY-1 Phone: [phone] Date 07/08/2023 Time 4:16 PM

## 2023-07-07 NOTE — Assessment & Plan Note (Signed)
Since our visit 2 weeks ago he was hospitalized for hyperkalemia due to missing 2 episodes of dialysis.  He was unable to make it to dialysis due to his deconditioning, transportation challenges.  He was discharged in good condition.  He is set to go to dialysis tomorrow.  Hyperkalemia has resolved.  Weight today is 361.  Does not appear volume overloaded.

## 2023-07-07 NOTE — Assessment & Plan Note (Addendum)
This is his chief complaint.  We have been working on pain medicine for this for a few months.  Neuropathy is severe in the bilateral lower extremities, due to compression/ICU neuropathy when he was intubated for many days due to severe pneumonia.  Currently it is best controlled with oxycodone, limited Lyrica, and some topical lotions.  Unfortunately his pain can be so severe that he is unable to leave his house and go to dialysis, which promotes recurrent hospital admissions.  Ideally in this ESRD patient he would not be on oxycodone.  He has been on Dilaudid in the past but does not feel it is as helpful.  He is however willing to trial return to Dilaudid, which would probably be a better long-term medicine for him given his kidney function. - Will stop his oxycodone which is 10 every 4 hours per his recent hospital discharge.  Will start a roughly equivalent regimen of Dilaudid.  Hydromorphone 4 mg every 6 hours.  Pain contract started today.  Unable to collect urine in this patient. - Will also start Effexor 37.5 daily.  Other neuropathic medicines such as Lyrica are limited to further up titration and due to his kidneys function, would prefer to avoid Cymbalta due to kidney function as well.

## 2023-07-07 NOTE — Patient Instructions (Addendum)
Let's change oxycodone to dilaudid. I am giving 4mg  every 6 hours for now. Call the clinic if any concerns. Do not take oxycodone and dilaudid together. If dilaudid does not work, we can change back to oxycodone.  I will also start effexor. Take that at a different time of day than the lyrica.

## 2023-07-07 NOTE — Assessment & Plan Note (Addendum)
He is markedly deconditioned due to his prolonged ICU and hospitalization December 2024, 2 months total, as well as his obesity and chronic health conditions.  This prevents him from walking more than a few strides across the room without assistance, he is unable to climb stairs, he is unable to walk up hills.  He completed inpatient rehab to work on strengthening after hospitalization and October.  He is unfortunately reliant on family members to push him up a grassy hill or for presentation providers to drive their vehicle around the back of his apartment in order for him to safely leave the apartment and make it to dialysis.  His most recent discharge, the process is started to obtain a motorized wheelchair to assist with making it to dialysis.  I believe this is very important to prevent further hospitalizations for emergent dialysis and further deterioration of his health.  Currently I do not have any paperwork to sign but understand that some should be sent to clinic shortly, I am receptive to these, and I will send him to neurorehab for PT evaluation to further this process.

## 2023-07-07 NOTE — Telephone Encounter (Signed)
Transition of care contact from inpatient facility  Date of Discharge: 07/05/23 Date of Contact: 07/07/23 Method of contact: Phone  Attempted to contact patient to discuss transition of care from inpatient admission. Patient did not answer the phone.Unable to leave VM. Will continue to follow up by phone/at outpatient HD.  Rogers Blocker, PA-C 07/07/2023, 2:48 PM  [facility] Pager: [phone]

## 2023-07-08 ENCOUNTER — Telehealth: Payer: Self-pay

## 2023-07-08 NOTE — Telephone Encounter (Signed)
Prior Authorization for patient (Dilaudid 4MG  tablets/HYDROmorphone HCl 4MG  tablets) came through on cover my meds was submitted with las office notes and labs awaiting approval or denial.  [id number]

## 2023-07-11 NOTE — Telephone Encounter (Signed)
Decision:Approved [name] (Key: [facility]) PA Case ID #: [id number] Need Help? Call us at [phone] Outcome Approved on December 13 by [facility] 2017 NCPDP Request Reference Number: [id number]. HYDROMORPHON TAB 4MG  is approved through 01/06/2024. For further questions, call [employer] at [phone]. Authorization Expiration Date: 01/06/2024 Drug HYDROmorphone HCl 4MG  tablets ePA cloud logo Form [employer] Electronic Prior Authorization Form [id number] NCPDP)

## 2023-07-18 ENCOUNTER — Other Ambulatory Visit: Payer: Self-pay

## 2023-07-18 NOTE — Patient Outreach (Signed)
Referral received from [facility]. Outreach to patient completed and scheduled.   Abelino Derrick, MHA [facility]  Managed [facility] Social Worker [phone]

## 2023-07-21 ENCOUNTER — Telehealth: Payer: Self-pay | Admitting: Student

## 2023-07-21 MED ORDER — DIGOXIN 125 MCG PO TABS: 0.1250 mg | ORAL_TABLET | ORAL | 1 refills | Status: DC

## 2023-07-21 MED ORDER — LEVOTHYROXINE SODIUM 25 MCG PO TABS: 25.0000 ug | ORAL_TABLET | Freq: Every day | ORAL | 3 refills | Status: DC

## 2023-07-21 MED ORDER — PREGABALIN 50 MG PO CAPS: 50.0000 mg | ORAL_CAPSULE | Freq: Every day | ORAL | 1 refills | Status: DC

## 2023-07-21 NOTE — Telephone Encounter (Signed)
Patient called the after-hours line for refills on pregabalin, levothyroxine, and digoxin.  I reviewed his PDMP which showed a 90-day supply of pregabalin filled on 06/20/2023.  However this prescription was written to be taken only on dialysis days and so he is about to run out.  He was given pregabalin 50 mg daily during his recent hospitalization and has been taking it that way since discharge.  He has not had any noticeable adverse effects.  I will refill his pregabalin at a daily dose and discussed side effects.  Also refilled levothyroxine at digoxin as well is recommended to get a follow-up with his cardiologist.

## 2023-07-22 NOTE — Progress Notes (Signed)
Internal Medicine Clinic Attending  I was physically present during the key portions of the resident provided service and participated in the medical decision making of patient's management care. I reviewed pertinent patient test results.  The assessment, diagnosis, and plan were formulated together and I agree with the documentation in the resident's note.  In additional to Dr. Scarlette Shorts documentation, mr. Novello wished to discuss desire to report hospitalization-related grievances to hospital administration.  Contact information for Patient Experience provided.  Miguel Aschoff, MD

## 2023-07-25 ENCOUNTER — Telehealth: Payer: Self-pay | Admitting: *Deleted

## 2023-07-25 MED ORDER — PREGABALIN 50 MG PO CAPS: 50.0000 mg | ORAL_CAPSULE | Freq: Every day | ORAL | 1 refills | Status: DC

## 2023-07-25 NOTE — Telephone Encounter (Signed)
Confusion with the directions for patient's Lyrica.  Please call  [facility] on [address].  Patients also is requesting that his Dilaudid be changed to Oxycodone if possible.  Has a follow up appointment before his next refill is needed.

## 2023-07-25 NOTE — Telephone Encounter (Signed)
Patient was called and informed that a corrected prescription for the Hilda Blades has been sent to the pharmacy.

## 2023-08-06 ENCOUNTER — Telehealth: Payer: Self-pay | Admitting: Student

## 2023-08-06 DIAGNOSIS — G629 Polyneuropathy, unspecified: Secondary | ICD-10-CM

## 2023-08-06 MED ORDER — HYDROMORPHONE HCL 4 MG PO TABS: 4.0000 mg | ORAL_TABLET | Freq: Four times a day (QID) | ORAL | 0 refills | Status: DC | PRN

## 2023-08-06 NOTE — Telephone Encounter (Signed)
 Fielded call from this patient regarding refill of hydromorphone . PDMP reviewed, refill is appropriate. He gets hydromorphone  from [facility], he has a pain contract with them. He sounds somewhat short of breath over the phone. I know Phillip Young well and that's not unusual for him, but I checked in and he said he missed dialysis on Friday due to transportation issues. I asked him if he felt like he needed to go to the hospital and he said no. I asked him if he has a plan for getting dialysis on Monday, and he's not sure. I told him to go to the hospital for worsening shortness of breath.  Phillip Kung MD 08/06/2023, 7:05 PM

## 2023-08-09 ENCOUNTER — Other Ambulatory Visit: Payer: Self-pay

## 2023-08-09 NOTE — Patient Instructions (Signed)
 Visit Information  Phillip Young was given information about Medicaid Managed Care team care coordination services as a part of their [facility] Community Plan Medicaid benefit. Phillip Young verbally consented to engagement with the [facility] Managed Care team.   If you are experiencing a medical emergency, please call 911 or report to your local emergency department or urgent care.   If you have a non-emergency medical problem during routine business hours, please contact your provider's office and ask to speak with a nurse.   For questions related to your [facility], please call: [phone] or visit the homepage here: [email or web address]  If you would like to schedule transportation through your [facility], please call the following number at least 2 days in advance of your appointment: [phone]   Rides for urgent appointments can also be made after hours by calling Member Services.  Call the Behavioral Health Crisis Line at [phone], at any time, 24 hours a day, 7 days a week. If you are in danger or need immediate medical attention call 911.  If you would like help to quit smoking, call 1-800-QUIT-NOW (7433346831) OR Espaol: 1-855-Djelo-Ya (8-144-664-6430) o para ms informacin haga clic aqu or Text READY to 799-599 to register via text  Phillip Young - following are the goals we discussed in your visit today:   Goals Addressed   None       Social Worker will follow up in 4 days.   Thersia Hoar, HEDWIG, MHA [facility]  Managed Medicaid Social Worker [phone]   Following is a copy of your plan of care:  There are no care plans that you recently modified to display for this patient.

## 2023-08-09 NOTE — Patient Outreach (Signed)
 Medicaid Managed Care Social Work Note  08/09/2023 Name:  Phillip Young MRN:  [id number] DOB:  14-Dec-1985  Phillip Young is an 38 y.o. year old male who is a primary patient of [name], DO.  The [facility] Managed Care Coordination team was consulted for assistance with:  Transportation Needs   Mr. Rueb was given information about Medicaid Managed Care Coordination team services today. [name] Patient agreed to services and verbal consent obtained.  Engaged with patient  for by telephone forinitial visit in response to referral for case management and/or care coordination services.   Patient is participating in a Managed Medicaid Plan:  Yes  Assessments/Interventions:  Review of past medical history, allergies, medications, health status, including review of consultants reports, laboratory and other test data, was performed as part of comprehensive evaluation and provision of chronic care management services.  SDOH: (Social Drivers of Health) assessments and interventions performed: SDOH Interventions    Flowsheet Row ED to Hosp-Admission (Discharged) from 05/18/2023 in [place] 6E Progressive Care Office Visit from 09/21/2022 in [facility] Care Coordination from 09/17/2022 in [facility] Community Care Coordination Telephone from 08/17/2022 in [facility] Community Care Coordination ED to Hosp-Admission (Discharged) from 08/05/2022 in [facility] 3E HF PCU ED to Hosp-Admission (Discharged) from 05/22/2022 in [place] 5W Medical Specialty PCU  SDOH Interventions        Food Insecurity Interventions -- Intervention Not Indicated Intervention Not Indicated Intervention Not Indicated -- --  Housing Interventions -- Intervention Not Indicated -- Intervention Not Indicated -- Intervention Not Indicated  Transportation Interventions Inpatient TOC, [facility]) Intervention Not Indicated Intervention Not Indicated -- Intervention Not Indicated, Inpatient TOC, Patient Resources (Friends/Family) --  Utilities Interventions -- Intervention Not Indicated -- -- -- --  Alcohol  Usage Interventions -- Intervention Not Indicated (Score <7) -- -- -- --  Financial Strain Interventions -- Intervention Not Indicated -- -- -- --  Physical Activity Interventions -- Intervention Not Indicated -- -- -- --  Stress Interventions -- Intervention Not Indicated -- -- -- --  Social Connections Interventions -- Intervention Not Indicated -- -- -- --      BSW completed a telephone outreach with patient about transportation, patient is currently living with his mother and is needing a stretcher to get to dialysis. Patient has been scheduling his own transportation with a manual wheelchair patient is unable to push on his own, [employer] is unable to assist with getting patient from the home to the car.  BSW contacted and spoke with the social worker at [employer], she states since patients last discharge is has moved in with his mother, she lives in the back of the apartment building and there is no walk way just grass which makes it difficult for transportation. Phillip Young states patient can come to dialysis in a stretcher but per the clinical manager will have to transfer to a manual wheelchair upon arrival, however there is no one that can assist with the transfer as patient is unable to transfer on his own.   BSW will contact [employer] transportation to see if a streacher is able to be provided for transportation, BSW keeps getting [facility]. BSW will continue trying. Advanced Directives Status:  Not addressed in this encounter.  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, SRA positive   Metoprolol  Other (See Comments)    near syncope   Amoxicillin  Other (See Comments)    Was hospitalized    Other Swelling and Other (See Comments)    Steroids Fluid seeping out of legs     Medications Reviewed Today   Medications were not reviewed in this encounter     Patient Active Problem List   Diagnosis Date Noted   ESRD needing dialysis ([name]) 07/02/2023   Hoarseness of voice 06/21/2023   Adjustment disorder with mixed anxiety and depressed mood 06/10/2023   Metabolic encephalopathy 06/07/2023   Severe muscle deconditioning 06/03/2023   HFrEF (heart failure with reduced ejection fraction) ([name]) 06/01/2023   Neuropathy 05/28/2023   MSSA bacteremia 05/26/2023   Hyperkalemia 03/21/2023   Central line infection 03/10/2023   Pressure injury of skin 01/23/2023   Acute hypoxic respiratory failure ([name]) 01/21/2023   Atrial fibrillation ([name]) 01/21/2023   Acute on chronic hypoxic respiratory failure ([name]) 08/07/2022   Congestive heart failure ([name]) 08/05/2022   Elevated liver enzymes 06/08/2022   Multiple nodules of lung 05/31/2022   History of Gram positive sepsis    Advanced care planning/counseling discussion    Recurrent infections 09/30/2021   Reactive depression 09/02/2021   Hypervolemia 08/18/2021   Type 2 diabetes mellitus with diabetic chronic kidney disease ([name]) 06/26/2021   Medical non-compliance 04/21/2021   Hypotension 04/21/2021   ESRD (end stage renal disease) on dialysis ([name]) 04/16/2021   Paroxysmal atrial flutter ([name]) 04/12/2021   Cardiogenic shock ([name])    Chronic respiratory failure with hypoxia ([name]) 02/11/2021   Chronic systolic CHF (congestive heart failure) ([name])    Chronic cough    Morbid obesity ([name]) 02/26/2020   GERD without esophagitis 02/26/2020   OSA (obstructive sleep apnea) 02/26/2020   Hidradenitis suppurativa 02/26/2020    Conditions to be addressed/monitored per PCP order:    transportation   There are no care plans that you recently modified to display for this patient.   Follow up:  Patient agrees to Care Plan and Follow-up.  Plan: The Managed Medicaid care management team will reach out to the patient again over the next 4 days.  Date/time of next scheduled Social Work care management/care coordination outreach:  08/15/23  Thersia Hoar, HEDWIG, [facility] Health  Managed [facility] Social Worker [phone]

## 2023-08-15 ENCOUNTER — Other Ambulatory Visit: Payer: Self-pay

## 2023-08-15 NOTE — Patient Outreach (Signed)
Medicaid Managed Care Social Work Note  08/15/2023 Name:  Phillip Young MRN:  [id number] DOB:  21-Sep-1985  Phillip Young is an 38 y.o. year old male who is a primary patient of [name], DO.  The [facility] Managed Care Coordination team was consulted for assistance with:  Transportation Needs   Phillip Young was given information about Medicaid Managed Care Coordination team services today. [name] Patient agreed to services and verbal consent obtained.  Engaged with patient  for by telephone forfollow up visit in response to referral for case management and/or care coordination services.   Patient is participating in a Managed Medicaid Plan:  Yes  Assessments/Interventions:  Review of past medical history, allergies, medications, health status, including review of consultants reports, laboratory and other test data, was performed as part of comprehensive evaluation and provision of chronic care management services.  SDOH: (Social Drivers of Health) assessments and interventions performed: SDOH Interventions    Flowsheet Row ED to Hosp-Admission (Discharged) from 05/18/2023 in [place] 6E Progressive Care Office Visit from 09/21/2022 in [facility] Dept Of [facility] Care Coordination from 09/17/2022 in [facility] Community Care Coordination Telephone from 08/17/2022 in [facility] Community Care Coordination ED to Hosp-Admission (Discharged) from 08/05/2022 in [facility] 3E HF PCU ED to Hosp-Admission (Discharged) from 05/22/2022 in [name] 5W Medical Specialty PCU  SDOH Interventions        Food Insecurity Interventions -- Intervention Not Indicated Intervention Not Indicated Intervention Not Indicated -- --  Housing Interventions -- Intervention Not Indicated -- Intervention Not Indicated -- Intervention Not Indicated  Transportation Interventions Inpatient TOC, PT[facility]) Intervention Not Indicated Intervention Not Indicated -- Intervention Not Indicated, Inpatient TOC, Patient Resources (Friends/Family) --  Utilities Interventions -- Intervention Not Indicated -- -- -- --  Alcohol Usage Interventions -- Intervention Not Indicated (Score <7) -- -- -- --  Financial Strain Interventions -- Intervention Not Indicated -- -- -- --  Physical Activity Interventions -- Intervention Not Indicated -- -- -- --  Stress Interventions -- Intervention Not Indicated -- -- -- --  Social Connections Interventions -- Intervention Not Indicated -- -- -- --     BSW completed a telephone outreach with patient, he states he has missed 2 appointments with dialysis due to his insurance being cancelled. BSW check [place] tracks and patient now has state Medicaid, BSW provided patient with the telephone number for DSS transportation to complete the assessment. Patient states he does have [employer] but does not use them because they are unable to come to the door to assist him.  Advanced Directives Status:  Not addressed in this encounter.  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, SRA positive   Metoprolol Other (See Comments)    near syncope   Amoxicillin Other (See Comments)    Was hospitalized    Other Swelling and Other (See Comments)    Steroids Fluid seeping out of legs     Medications Reviewed Today   Medications were not reviewed in this encounter     Patient Active Problem List   Diagnosis Date Noted  ESRD needing dialysis ([name]) 07/02/2023   Hoarseness of voice 06/21/2023   Adjustment disorder with mixed anxiety and depressed mood 06/10/2023   Metabolic encephalopathy 06/07/2023   Severe muscle deconditioning 06/03/2023   HFrEF  (heart failure with reduced ejection fraction) ([name]) 06/01/2023   Neuropathy 05/28/2023   MSSA bacteremia 05/26/2023   Hyperkalemia 03/21/2023   Central line infection 03/10/2023   Pressure injury of skin 01/23/2023   Acute hypoxic respiratory failure ([name]) 01/21/2023   Atrial fibrillation ([name]) 01/21/2023   Acute on chronic hypoxic respiratory failure ([name]) 08/07/2022   Congestive heart failure ([name]) 08/05/2022   Elevated liver enzymes 06/08/2022   Multiple nodules of lung 05/31/2022   History of Gram positive sepsis    Advanced care planning/counseling discussion    Recurrent infections 09/30/2021   Reactive depression 09/02/2021   Hypervolemia 08/18/2021   Type 2 diabetes mellitus with diabetic chronic kidney disease ([name]) 06/26/2021   Medical non-compliance 04/21/2021   Hypotension 04/21/2021   ESRD (end stage renal disease) on dialysis ([name]) 04/16/2021   Paroxysmal atrial flutter ([name]) 04/12/2021   Cardiogenic shock ([name])    Chronic respiratory failure with hypoxia ([name]) 02/11/2021   Chronic systolic CHF (congestive heart failure) ([name])    Chronic cough    Morbid obesity ([name]) 02/26/2020   GERD without esophagitis 02/26/2020   OSA (obstructive sleep apnea) 02/26/2020   Hidradenitis suppurativa 02/26/2020    Conditions to be addressed/monitored per PCP order:   transportation  There are no care plans that you recently modified to display for this patient.   Follow up:  Patient agrees to Care Plan and Follow-up.  Plan: The Managed Medicaid care management team will reach out to the patient again over the next 10 days.  Date/time of next scheduled Social Work care management/care coordination outreach:  08/29/23  Gus Puma, Kenard Gower, [facility] Health  Managed [facility] Social Worker [phone]

## 2023-08-15 NOTE — Patient Instructions (Signed)
Visit Information  The Patient                                              was given information about Medicaid Managed Care team care coordination services and consented to engagement with the [facility] Managed Care team.   Social Worker will follow up in 10 days.   Abelino Derrick, MHA [facility]  Managed [facility] Social Worker [phone]

## 2023-08-22 NOTE — Progress Notes (Unsigned)
[facility] [address] [place] [address] Phone: [phone] Subjective:   Bruce Donath, am serving as a scribe for Dr. Antoine Primas.  I'm seeing this patient by the request  of:  [name], DO  CC: Back pain, foot drop  [id number]  Demarquis Osley is a 38 y.o. male coming in with complaint of foot drop.  Patient's past medical history is significant for atrial flutter, type II diabetics, severe obesity.  Patient is also on dialysis.  Patient is on pain medications including Dilaudid. Patient states that he is having a little bit of back pain right above his butt. Right foot is dead since 2024-06-30since (intubation). Patient states that he has neuropathy, feels like ice in his foot.    Patient had a CT scan done in December 2024 showing the patient did have a decubitus ulcer of the sacrum that likely did not have any significant bony involvement.    Patient did have a CT pelvis done in December when he was hospitalized that was independently visualized by me showing that there was significant cellulitic changes of the sacral wound but did not appear to have any bone involvement.  Seem to be more of a stage II may be early stage III decubitus ulcer.  Past Medical History:  Diagnosis Date   Acute on chronic respiratory failure with hypoxia ([name]) 04/21/2021   Acute on chronic systolic (congestive) heart failure ([name]) 02/26/2020   Amiodarone toxicity    Anemia    Atrial flutter ([name])    Biventricular congestive heart failure ([name])    Chronic hypoxemic respiratory failure ([name])    Class 3 severe obesity due to excess calories with serious comorbidity and body mass index (BMI) of 50.0 to 59.9 in adult [facility]) 02/26/2020   Essential hypertension 02/26/2020   GERD without esophagitis 02/26/2020   Hidradenitis suppurativa 02/26/2020   NICM (nonischemic cardiomyopathy) ([name])    Obesity hypoventilation syndrome ([name])    OSA (obstructive  sleep apnea)    PAF (paroxysmal atrial fibrillation) ([name])    Pneumonia    Prediabetes 02/26/2020   Past Surgical History:  Procedure Laterality Date   ABSCESS DRAINAGE     AV FISTULA PLACEMENT Left 08/21/2021   Procedure: LEFT ARM ARTERIOVENOUS (AV) FISTULA.;  Surgeon: Nada Libman, MD;  Location: [facility] OR;  Service: Vascular;  Laterality: Left;   CARDIAC CATHETERIZATION     CARDIOVERSION N/A 10/09/2021   Procedure: CARDIOVERSION;  Surgeon: Laurey Morale, MD;  Location: [facility] ENDOSCOPY;  Service: Cardiovascular;  Laterality: N/A;   CARDIOVERSION N/A 05/28/2022   Procedure: CARDIOVERSION;  Surgeon: Laurey Morale, MD;  Location: [facility] ENDOSCOPY;  Service: Cardiovascular;  Laterality: N/A;   CARDIOVERSION N/A 06/07/2022   Procedure: CARDIOVERSION;  Surgeon: Laurey Morale, MD;  Location: [facility] ENDOSCOPY;  Service: Cardiovascular;  Laterality: N/A;   IR FLUORO GUIDE CV LINE RIGHT  03/10/2021   IR FLUORO GUIDE CV LINE RIGHT  04/22/2021   IR FLUORO GUIDE CV LINE RIGHT  08/20/2021   IR FLUORO GUIDE CV LINE RIGHT  03/01/2023   IR FLUORO GUIDE CV LINE RIGHT  03/09/2023   IR FLUORO GUIDE CV LINE RIGHT  05/30/2023   IR PTA VENOUS EXCEPT DIALYSIS CIRCUIT  03/09/2023   IR REMOVAL TUN CV CATH W/O FL  05/26/2023   IR REMOVE CV FIBRIN SHEATH  03/09/2023   IR US GUIDE VASC ACCESS RIGHT  03/10/2021   IR US GUIDE VASC ACCESS RIGHT  04/22/2021  IR US GUIDE VASC ACCESS RIGHT  05/30/2023   RIGHT HEART CATH N/A 03/06/2021   Procedure: RIGHT HEART CATH;  Surgeon: Dolores Patty, MD;  Location: [facility] INVASIVE CV LAB;  Service: Cardiovascular;  Laterality: N/A;   RIGHT/LEFT HEART CATH AND CORONARY ANGIOGRAPHY N/A 03/04/2020   Procedure: RIGHT/LEFT HEART CATH AND CORONARY ANGIOGRAPHY;  Surgeon: Laurey Morale, MD;  Location: [facility] INVASIVE CV LAB;  Service: Cardiovascular;  Laterality: N/A;   TEE WITHOUT CARDIOVERSION N/A 05/05/2021   Procedure: TRANSESOPHAGEAL ECHOCARDIOGRAM (TEE);  Surgeon: Laurey Morale,  MD;  Location: [facility] ENDOSCOPY;  Service: Cardiovascular;  Laterality: N/A;   TEMPORARY DIALYSIS CATHETER  03/06/2021   Procedure: TEMPORARY DIALYSIS CATHETER;  Surgeon: Dolores Patty, MD;  Location: [facility] INVASIVE CV LAB;  Service: Cardiovascular;;   Social History   Socioeconomic History   Marital status: Single    Spouse name: Not on file   Number of children: Not on file   Years of education: Not on file   Highest education level: Not on file  Occupational History   Not on file  Tobacco Use   Smoking status: Former    Current packs/day: 0.00    Types: Cigarettes    Quit date: 2019    Years since quitting: 6.0    Passive exposure: Current   Smokeless tobacco: Former   Tobacco comments:    quit in 2019  Vaping Use   Vaping status: Never Used  Substance and Sexual Activity   Alcohol use: Never   Drug use: Never   Sexual activity: Not on file  Other Topics Concern   Not on file  Social History Narrative   Not on file   Social Drivers of Health   Financial Resource Strain: Low Risk  (03/24/2023)   Received from [facility]   Overall Financial Resource Strain (CARDIA)    Difficulty of Paying Living Expenses: Not very hard  Food Insecurity: No Food Insecurity (07/02/2023)   Hunger Vital Sign    Worried About Running Out of Food in the Last Year: Never true    Ran Out of Food in the Last Year: Never true  Transportation Needs: Unmet Transportation Needs (07/02/2023)   PRAPARE - Transportation    Lack of Transportation (Medical): Yes    Lack of Transportation (Non-Medical): Yes  Physical Activity: Inactive (09/21/2022)   Exercise Vital Sign    Days of Exercise per Week: 0 days    Minutes of Exercise per Session: 0 min  Stress: No Stress Concern Present (04/21/2023)   Received from [facility]   [employer] of Occupational Health - Occupational Stress Questionnaire    Feeling of Stress : Only a little  Social Connections: Moderately Isolated (03/24/2023)    Received from [facility]   Social Connection and Isolation Panel [NHANES]    Frequency of Communication with Friends and Family: More than three times a week    Frequency of Social Gatherings with Friends and Family: More than three times a week    Attends Religious Services: 1 to 4 times per year    Active Member of [employer] or Organizations: No    Attends Banker Meetings: Never    Marital Status: Never married   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, SRA  positive   Metoprolol Other (See Comments)    near syncope   Amoxicillin Other (See Comments)    Was hospitalized    Other Swelling and Other (See Comments)    Steroids Fluid seeping out of legs    Family History  Problem Relation Age of Onset   Heart disease Mother    Hypertension Mother    Pulmonary Hypertension Mother    Drug abuse Father        died due to Heroin overdose    Current Outpatient Medications (Endocrine & Metabolic):    levothyroxine (SYNTHROID) 25 MCG tablet, Take 1 tablet (25 mcg total) by mouth daily at 6 (six) AM.  Current Outpatient Medications (Cardiovascular):    digoxin (LANOXIN) 0.125 MG tablet, Take 1 tablet (0.125 mg total) by mouth 3 (three) times a week. Monday, Wednesday, Friday (dialysis days)   midodrine (PROAMATINE) 10 MG tablet, Take 3 tablets (30 mg total) by mouth 3 (three) times daily with meals.   Current Outpatient Medications (Analgesics):    HYDROmorphone (DILAUDID) 4 MG tablet, Take 1 tablet (4 mg total) by mouth every 6 (six) hours as needed for severe pain (pain score 7-10).  Current Outpatient Medications (Hematological):    apixaban (ELIQUIS) 5 MG TABS tablet, Take 1 tablet (5 mg total) by mouth 2 (two) times daily.   Darbepoetin Alfa (ARANESP) 150  MCG/0.3ML SOSY injection, Inject 0.3 mLs (150 mcg total) into the skin every Saturday at 6 PM.  Current Outpatient Medications (Other):    cholecalciferol (VITAMIN D3) 10 MCG (400 UNIT) TABS tablet, Take 1 tablet (400 Units total) by mouth daily.   lanthanum (FOSRENOL) 1000 MG chewable tablet, Chew 1 tablet (1,000 mg total) by mouth 3 (three) times daily with meals.   leptospermum manuka honey (MEDIHONEY) PSTE paste, Apply 1 Application topically daily.   lidocaine (XYLOCAINE) 5 % ointment, Apply 1 Application topically as needed. (Patient not taking: Reported on 07/02/2023)   Nystatin (GERHARDT'S BUTT CREAM) CREA, Apply 1 Application topically 3 (three) times daily.   ondansetron (ZOFRAN) 4 MG tablet, Take 1 tablet (4 mg total) by mouth daily as needed for nausea or vomiting.   pregabalin (LYRICA) 50 MG capsule, Take 1 capsule (50 mg total) by mouth daily. Take one capsule daily.   venlafaxine XR (EFFEXOR XR) 37.5 MG 24 hr capsule, Take 1 capsule (37.5 mg total) by mouth daily.   Reviewed prior external information including notes and imaging from  primary care provider As well as notes that were available from care everywhere and other healthcare systems.  Past medical history, social, surgical and family history all reviewed in electronic medical record.  No pertanent information unless stated regarding to the chief complaint.   Review of Systems:  No headache, visual changes, nausea, vomiting, diarrhea, constipation, dizziness, abdominal pain, skin rash, fevers, chills, night sweats, weight loss, swollen lymph nodes, body aches, joint swelling, chest pain, shortness of breath, mood changes. POSITIVE muscle aches  Objective  There were no vitals taken for this visit.   General: No apparent distress alert and oriented x3 mood and affect normal, dressed appropriately.  HEENT: Pupils equal, extraocular movements intact  Respiratory: Patient's speak in full sentences but is on oxygen.  Does  seem to be short of breath even at baseline.  Patient does have significant 2+ to 3+ pitting edema of the lower extremities.  Right leg patient has full flexion of the hip.  Patient has full extension of the leg.  Unable to move the  ankle at all.  Patient has 0 out of 5 strength noted with dorsi flexion of the foot and questionable 2 out of 5 strength with some plantarflexion.  Deep tendon reflex on the right side is completely absent.  Patient does have some neuropathy that seems to be relatively symmetric bilaterally.  Does have a bluish or dusky appearance of some of his toes.  Foot though feels relatively warm at the moment.     Impression and Recommendations:    The above documentation has been reviewed and is accurate and complete Judi Saa, DO

## 2023-08-24 ENCOUNTER — Ambulatory Visit: Payer: Medicare Other | Admitting: Family Medicine

## 2023-08-24 VITALS — BP 118/72 | HR 99 | Ht 72.0 in

## 2023-08-24 DIAGNOSIS — R200 Anesthesia of skin: Secondary | ICD-10-CM | POA: Diagnosis not present

## 2023-08-24 DIAGNOSIS — R7881 Bacteremia: Secondary | ICD-10-CM | POA: Diagnosis not present

## 2023-08-24 DIAGNOSIS — B9561 Methicillin susceptible Staphylococcus aureus infection as the cause of diseases classified elsewhere: Secondary | ICD-10-CM

## 2023-08-24 DIAGNOSIS — M21371 Foot drop, right foot: Secondary | ICD-10-CM

## 2023-08-24 DIAGNOSIS — R29898 Other symptoms and signs involving the musculoskeletal system: Secondary | ICD-10-CM | POA: Diagnosis not present

## 2023-08-24 NOTE — Patient Instructions (Addendum)
[facility]  ([facility] [employer] in [facility]) [address], [address] [place], [place] [address] [phone]  [facility] Neu[facility] will call you MRI lumbar [phone] MRI brain w/o [phone] We will be in touch

## 2023-08-25 ENCOUNTER — Encounter: Payer: Self-pay | Admitting: Family Medicine

## 2023-08-25 ENCOUNTER — Ambulatory Visit: Payer: Medicare Other | Admitting: Student

## 2023-08-25 VITALS — BP 107/75 | HR 108 | Temp 98.0°F | Ht 72.0 in | Wt 370.0 lb

## 2023-08-25 DIAGNOSIS — E1122 Type 2 diabetes mellitus with diabetic chronic kidney disease: Secondary | ICD-10-CM

## 2023-08-25 DIAGNOSIS — G4733 Obstructive sleep apnea (adult) (pediatric): Secondary | ICD-10-CM

## 2023-08-25 DIAGNOSIS — N186 End stage renal disease: Secondary | ICD-10-CM | POA: Diagnosis not present

## 2023-08-25 DIAGNOSIS — Z992 Dependence on renal dialysis: Secondary | ICD-10-CM

## 2023-08-25 DIAGNOSIS — G629 Polyneuropathy, unspecified: Secondary | ICD-10-CM

## 2023-08-25 DIAGNOSIS — I4891 Unspecified atrial fibrillation: Secondary | ICD-10-CM

## 2023-08-25 DIAGNOSIS — Z7985 Long-term (current) use of injectable non-insulin antidiabetic drugs: Secondary | ICD-10-CM | POA: Diagnosis not present

## 2023-08-25 DIAGNOSIS — R29898 Other symptoms and signs involving the musculoskeletal system: Secondary | ICD-10-CM | POA: Insufficient documentation

## 2023-08-25 DIAGNOSIS — I4819 Other persistent atrial fibrillation: Secondary | ICD-10-CM

## 2023-08-25 DIAGNOSIS — I5020 Unspecified systolic (congestive) heart failure: Secondary | ICD-10-CM

## 2023-08-25 MED ORDER — OXYCODONE HCL 10 MG PO TABA: 10.0000 mg | ORAL_TABLET | Freq: Four times a day (QID) | ORAL | 0 refills | Status: DC | PRN

## 2023-08-25 MED ORDER — TIRZEPATIDE-WEIGHT MANAGEMENT 2.5 MG/0.5ML ~~LOC~~ SOLN: 2.5000 mg | SUBCUTANEOUS | 1 refills | Status: DC

## 2023-08-25 NOTE — Assessment & Plan Note (Addendum)
Patient also has a history of HFrEF.  He is currently on digoxin for his A-fib.  Not currently following with cardiology, will put in referral for him to see cardiologist from recent admission.

## 2023-08-25 NOTE — Assessment & Plan Note (Signed)
Currently going to dialysis with great difficulty.  His uncle has been very helpful in getting him to his appointments and dialysis sessions.  His uncle puts him in his wheelchair and pushes him through the grass around the back of the house to the car.  From there he is able to get to his appointments and dialysis.

## 2023-08-25 NOTE — Assessment & Plan Note (Signed)
Requires BiPAP therapy, states that he feels better when sleeping with his mask.  He does have chronic hypoxic respiratory failure, on 4 L only seen in the office today.

## 2023-08-25 NOTE — Progress Notes (Signed)
Subjective:  CC: Follow-up on chronic conditions  HPI:  [name] is a 38 y.o. person with a past medical history stated below and presents today for the stated chief complaint. Please see problem based assessment and plan for additional details.  Past Medical History:  Diagnosis Date   Acute on chronic respiratory failure with hypoxia ([name]) 04/21/2021   Acute on chronic systolic (congestive) heart failure ([name]) 02/26/2020   Amiodarone toxicity    Anemia    Atrial flutter ([name])    Biventricular congestive heart failure ([name])    Chronic hypoxemic respiratory failure ([name])    Class 3 severe obesity due to excess calories with serious comorbidity and body mass index (BMI) of 50.0 to 59.9 in adult [facility]) 02/26/2020   Essential hypertension 02/26/2020   GERD without esophagitis 02/26/2020   Hidradenitis suppurativa 02/26/2020   NICM (nonischemic cardiomyopathy) ([name])    Obesity hypoventilation syndrome ([name])    OSA (obstructive sleep apnea)    PAF (paroxysmal atrial fibrillation) ([name])    Pneumonia    Prediabetes 02/26/2020    Current Outpatient Medications on File Prior to Visit  Medication Sig Dispense Refill   apixaban (ELIQUIS) 5 MG TABS tablet Take 1 tablet (5 mg total) by mouth 2 (two) times daily.     cholecalciferol (VITAMIN D3) 10 MCG (400 UNIT) TABS tablet Take 1 tablet (400 Units total) by mouth daily. 100 tablet 0   Darbepoetin Alfa (ARANESP) 150 MCG/0.3ML SOSY injection Inject 0.3 mLs (150 mcg total) into the skin every Saturday at 6 PM.     digoxin (LANOXIN) 0.125 MG tablet Take 1 tablet (0.125 mg total) by mouth 3 (three) times a week. Monday, Wednesday, Friday (dialysis days) 30 tablet 1   lanthanum (FOSRENOL) 1000 MG chewable tablet Chew 1 tablet (1,000 mg total) by mouth 3 (three) times daily with meals. 90 tablet 1   leptospermum manuka honey (MEDIHONEY) PSTE paste Apply 1 Application topically daily. 15 mL 0   levothyroxine (SYNTHROID) 25 MCG tablet  Take 1 tablet (25 mcg total) by mouth daily at 6 (six) AM. 90 tablet 3   lidocaine (XYLOCAINE) 5 % ointment Apply 1 Application topically as needed. (Patient not taking: Reported on 07/02/2023) 35.44 g 0   midodrine (PROAMATINE) 10 MG tablet Take 3 tablets (30 mg total) by mouth 3 (three) times daily with meals. 270 tablet 0   Nystatin (GERHARDT'S BUTT CREAM) CREA Apply 1 Application topically 3 (three) times daily. 1 each 0   ondansetron (ZOFRAN) 4 MG tablet Take 1 tablet (4 mg total) by mouth daily as needed for nausea or vomiting. 30 tablet 0   pregabalin (LYRICA) 50 MG capsule Take 1 capsule (50 mg total) by mouth daily. Take one capsule daily. 90 capsule 1   venlafaxine XR (EFFEXOR XR) 37.5 MG 24 hr capsule Take 1 capsule (37.5 mg total) by mouth daily. 30 capsule 2   No current facility-administered medications on file prior to visit.    Review of Systems: Please see assessment and plan for pertinent positives and negatives.  Objective:   Vitals:   08/25/23 1558  BP: 107/75  Pulse: (!) 108  Temp: 98 F (36.7 C)  TempSrc: Oral  SpO2: 95%  Weight: (!) 370 lb (167.8 kg)  Height: 6' (1.829 m)    Physical Exam: Constitutional: Chronically ill-appearing, Cardiovascular: Irregular rate and rhythm Pulmonary/Chest: lungs clear to auscultation bilaterally.  On 4 L nasal cannula Abdominal: soft, non-tender, non-distended Extremities: Moderate lower extremities bilaterally, difficult to  assess given patient's extreme sensitivity in setting of severe peripheral neuropathy. Psych: Pleasant affect Thought process is linear and is goal-directed.     Assessment & Plan:  OSA (obstructive sleep apnea) Requires BiPAP therapy, states that he feels better when sleeping with his mask.  He does have chronic hypoxic respiratory failure, on 4 L only seen in the office today.  Type 2 diabetes mellitus with diabetic chronic kidney disease ([name]) Recent A1c is normal, however he is ESRD on dialysis  which obfuscates true A1c.  He does have a history of diabetes, with a A1c of greater than 6.5 visible upon chart review.  In combination with his known sleep apnea, cardiac pathology, and obesity, I believe this patient would be a excellent candidate for GLP-1 therapies.  This will be very important for his health moving forward, and treatment of this obesity, OSA, diabetes will be imperative to help this patient with their breathing, heart, chronic pain and ambulatory difficulties. Plan: Initiate Zepbound 2.5 mg weekly.   Atrial fibrillation [facility]) Patient also has a history of HFrEF.  He is currently on digoxin for his A-fib.  Not currently following with cardiology, will put in referral for him to see cardiologist from recent admission.  ESRD needing dialysis [facility]) Currently going to dialysis with great difficulty.  His uncle has been very helpful in getting him to his appointments and dialysis sessions.  His uncle puts him in his wheelchair and pushes him through the grass around the back of the house to the car.  From there he is able to get to his appointments and dialysis.  Neuropathy Patient has multiple sources of chronic pain including his back, ulcers, and peripheral neuropathy.  The source of his neuropathy likely being ICU or critical illness neuropathy from prolonged intubation (2 months plus).  This was discussed with the patient today.  He is taking pregabalin as well as Dilaudid.  Current Dilaudid regimen is 4 mg every 6 hours.  He does state that oxycodone worked better for him, will be interested in trying to go back to oxycodone.  He is a dialysis patient, however he does still produce some urine.  While Dilaudid may be slightly referred, oxycodone should still be a safe option in this patient who has been on oxycodone in the recent past.  We will switch him to oxycodone 10 mg every 6 hours, this will decrease his daily morphine milligram equivalents.  He is scheduled to see [place] in  1 month, we can continue to discuss his pain management regimen at that time as well.  Plan: Will initiate oxycodone 10 mg every 6 hours after he finishes his current Dilaudid prescription.    Patient discussed with Dr. Julian Reil MD [facility] Internal Medicine  PGY-1 Pager: [phone]  Phone: [phone] Date 08/25/2023  Time 9:54 PM

## 2023-08-25 NOTE — Assessment & Plan Note (Addendum)
Patient has multiple sources of chronic pain including his back, ulcers, and peripheral neuropathy.  The source of his neuropathy likely being ICU or critical illness neuropathy from prolonged intubation (2 months plus).  This was discussed with the patient today.  He is taking pregabalin as well as Dilaudid.  Current Dilaudid regimen is 4 mg every 6 hours.  He does state that oxycodone worked better for him, will be interested in trying to go back to oxycodone.  He is a dialysis patient, however he does still produce some urine.  While Dilaudid may be slightly referred, oxycodone should still be a safe option in this patient who has been on oxycodone in the recent past.  We will switch him to oxycodone 10 mg every 6 hours, this will decrease his daily morphine milligram equivalents.  He is scheduled to see [place] in 1 month, we can continue to discuss his pain management regimen at that time as well.  Plan: Will initiate oxycodone 10 mg every 6 hours after he finishes his current Dilaudid prescription.

## 2023-08-25 NOTE — Assessment & Plan Note (Addendum)
Recent A1c is normal, however he is ESRD on dialysis which obfuscates true A1c.  He does have a history of diabetes, with a A1c of greater than 6.5 visible upon chart review.  In combination with his known sleep apnea, cardiac pathology, and obesity, I believe this patient would be a excellent candidate for GLP-1 therapies.  This will be very important for his health moving forward, and treatment of this obesity, OSA, diabetes will be imperative to help this patient with their breathing, heart, chronic pain and ambulatory difficulties. Plan: Initiate Zepbound 2.5 mg weekly.

## 2023-08-25 NOTE — Assessment & Plan Note (Signed)
Unfortunately new weakness with significant with foot drop.  0 out of 5 strength noted of the right dorsi flexion of the foot and only 2 out of 5 strength noted of plantarflexion of the foot.  Patient's deep tendon reflex is 0 out of 2 on the right side compared to brisk on the left side.  Patient does though have neuropathy noted as well.  Secondary to patient's comorbidities he is in a wheelchair at the moment as well as severe muscle deconditioning which does make this difficult.  Patient was in the hospital in December and since that time he has had this weakness and it does not seem to be getting any better.  Patient has had significant cardiovascular disease and do feel that ABI is warranted at this time.  Difficult with patient's swelling to truly tell how much that is playing a role.  In addition to that I do feel at this point a lumbar spine MRI is warranted with patient having a sacral decubitus ulcer previously and making sure there is no osteomyelitis that could be contributing to some of the weakness of the nerve. Due to chronic kidney disease  ESR would not be a valuable indicator at this time. Differential also includes the possibility of with patient having the chronic respiratory failure and hypoxia if there was any type of CVA difficulty.  CT of the head was unremarkable but do feel MRI to further evaluate.  Nerve conduction test could also be significantly beneficial and may even need neurology input to help [place] at this moment.  Very difficult patient.  Patient was accompanied with friend and all questions were answered.  Hopeful that with the workup we will have direction and help patient improve quality of life

## 2023-08-25 NOTE — Patient Instructions (Signed)
Thank you, [name] for allowing [place] to provide your care today.   Referrals ordered today:    Referral Orders         Ambulatory referral to Cardiology      I have ordered the following medication/changed the following medications:   Stop the following medications: Medications Discontinued During This Encounter  Medication Reason   HYDROmorphone (DILAUDID) 4 MG tablet      Start the following medications: Meds ordered this encounter  Medications   oxyCODONE HCl 10 MG TABA    Sig: Take 10 mg by mouth every 6 (six) hours as needed.    Dispense:  120 tablet    Refill:  0   tirzepatide (ZEPBOUND) 2.5 MG/0.5ML injection vial    Sig: Inject 2.5 mg into the skin once a week.    Dispense:  2 mL    Refill:  1     Follow up:  1 month  for follow up on medications and chronic conditions    We look forward to seeing you next time. Please call our clinic at [phone] if you have any questions or concerns. The best time to call is Monday-Friday from 9am-4pm, but there is someone available 24/7. If after hours or the weekend, call the main hospital number and ask for the Internal Medicine Resident On-Call. If you need medication refills, please notify your pharmacy one week in advance and they will send [place] a request.   Thank you for trusting me with your care. Wishing you the best!  Lovie Macadamia MD [facility]

## 2023-08-29 ENCOUNTER — Inpatient Hospital Stay: Admission: RE | Admit: 2023-08-29 | Payer: Medicare Other | Source: Ambulatory Visit

## 2023-08-29 ENCOUNTER — Other Ambulatory Visit: Payer: Self-pay

## 2023-08-29 NOTE — Progress Notes (Signed)
 Internal Medicine Clinic Attending  Case discussed with the resident at the time of the visit.  We reviewed the resident's history and exam and pertinent patient test results.  I agree with the assessment, diagnosis, and plan of care documented in the resident's note.

## 2023-08-29 NOTE — Patient Instructions (Signed)
Visit Information  The Patient                                              was given information about Medicaid Managed Care team care coordination services and consented to engagement with the [facility] Managed Care team.   Social Worker will follow up.   Abelino Derrick, MHA [facility]  Managed [facility] Social Worker [phone]

## 2023-08-29 NOTE — Patient Outreach (Signed)
BSW completed a telephone outreach with patient, he states that his feet were swollen and he was in pain. Patient stated he was going to contact 911 and go to the hospital. BSW encouraged him to do so, patient stated he would call.    Abelino Derrick, MHA [facility]  [facility] Social Worker [phone]

## 2023-08-30 ENCOUNTER — Other Ambulatory Visit: Payer: Self-pay

## 2023-08-30 ENCOUNTER — Inpatient Hospital Stay (HOSPITAL_COMMUNITY)
Admission: EM | Admit: 2023-08-30 | Discharge: 2023-09-06 | DRG: 640 | Disposition: A | Discharge status: Skilled Nursing Facility | Payer: Medicare Other | Attending: Internal Medicine | Admitting: Internal Medicine

## 2023-08-30 ENCOUNTER — Encounter (HOSPITAL_COMMUNITY): Payer: Self-pay

## 2023-08-30 ENCOUNTER — Emergency Department (HOSPITAL_COMMUNITY): Payer: Medicare Other

## 2023-08-30 DIAGNOSIS — J9611 Chronic respiratory failure with hypoxia: Secondary | ICD-10-CM | POA: Diagnosis not present

## 2023-08-30 DIAGNOSIS — Z880 Allergy status to penicillin: Secondary | ICD-10-CM

## 2023-08-30 DIAGNOSIS — E875 Hyperkalemia: Secondary | ICD-10-CM | POA: Diagnosis present

## 2023-08-30 DIAGNOSIS — I5020 Unspecified systolic (congestive) heart failure: Secondary | ICD-10-CM

## 2023-08-30 DIAGNOSIS — E662 Morbid (severe) obesity with alveolar hypoventilation: Secondary | ICD-10-CM | POA: Diagnosis present

## 2023-08-30 DIAGNOSIS — I132 Hypertensive heart and chronic kidney disease with heart failure and with stage 5 chronic kidney disease, or end stage renal disease: Secondary | ICD-10-CM | POA: Diagnosis present

## 2023-08-30 DIAGNOSIS — J9622 Acute and chronic respiratory failure with hypercapnia: Secondary | ICD-10-CM | POA: Diagnosis present

## 2023-08-30 DIAGNOSIS — G629 Polyneuropathy, unspecified: Secondary | ICD-10-CM | POA: Diagnosis not present

## 2023-08-30 DIAGNOSIS — E871 Hypo-osmolality and hyponatremia: Secondary | ICD-10-CM | POA: Diagnosis present

## 2023-08-30 DIAGNOSIS — T462X5S Adverse effect of other antidysrhythmic drugs, sequela: Secondary | ICD-10-CM

## 2023-08-30 DIAGNOSIS — J9621 Acute and chronic respiratory failure with hypoxia: Secondary | ICD-10-CM | POA: Diagnosis present

## 2023-08-30 DIAGNOSIS — L89154 Pressure ulcer of sacral region, stage 4: Secondary | ICD-10-CM | POA: Diagnosis not present

## 2023-08-30 DIAGNOSIS — R262 Difficulty in walking, not elsewhere classified: Secondary | ICD-10-CM | POA: Diagnosis present

## 2023-08-30 DIAGNOSIS — E1122 Type 2 diabetes mellitus with diabetic chronic kidney disease: Secondary | ICD-10-CM | POA: Diagnosis present

## 2023-08-30 DIAGNOSIS — Z1152 Encounter for screening for COVID-19: Secondary | ICD-10-CM

## 2023-08-30 DIAGNOSIS — E039 Hypothyroidism, unspecified: Secondary | ICD-10-CM | POA: Diagnosis present

## 2023-08-30 DIAGNOSIS — G8929 Other chronic pain: Secondary | ICD-10-CM | POA: Diagnosis present

## 2023-08-30 DIAGNOSIS — I4819 Other persistent atrial fibrillation: Secondary | ICD-10-CM | POA: Diagnosis present

## 2023-08-30 DIAGNOSIS — L89153 Pressure ulcer of sacral region, stage 3: Secondary | ICD-10-CM | POA: Diagnosis present

## 2023-08-30 DIAGNOSIS — E1142 Type 2 diabetes mellitus with diabetic polyneuropathy: Secondary | ICD-10-CM | POA: Diagnosis present

## 2023-08-30 DIAGNOSIS — I5022 Chronic systolic (congestive) heart failure: Secondary | ICD-10-CM | POA: Diagnosis present

## 2023-08-30 DIAGNOSIS — N186 End stage renal disease: Secondary | ICD-10-CM | POA: Diagnosis present

## 2023-08-30 DIAGNOSIS — Z992 Dependence on renal dialysis: Secondary | ICD-10-CM

## 2023-08-30 DIAGNOSIS — I4892 Unspecified atrial flutter: Secondary | ICD-10-CM | POA: Diagnosis present

## 2023-08-30 DIAGNOSIS — R29898 Other symptoms and signs involving the musculoskeletal system: Secondary | ICD-10-CM | POA: Diagnosis present

## 2023-08-30 DIAGNOSIS — Z91158 Patient's noncompliance with renal dialysis for other reason: Secondary | ICD-10-CM

## 2023-08-30 DIAGNOSIS — L732 Hidradenitis suppurativa: Secondary | ICD-10-CM | POA: Diagnosis present

## 2023-08-30 DIAGNOSIS — Z6841 Body Mass Index (BMI) 40.0 and over, adult: Secondary | ICD-10-CM | POA: Diagnosis not present

## 2023-08-30 DIAGNOSIS — I9589 Other hypotension: Secondary | ICD-10-CM | POA: Diagnosis present

## 2023-08-30 DIAGNOSIS — Z9981 Dependence on supplemental oxygen: Secondary | ICD-10-CM

## 2023-08-30 DIAGNOSIS — N2581 Secondary hyperparathyroidism of renal origin: Secondary | ICD-10-CM | POA: Diagnosis present

## 2023-08-30 DIAGNOSIS — G4733 Obstructive sleep apnea (adult) (pediatric): Secondary | ICD-10-CM | POA: Diagnosis present

## 2023-08-30 DIAGNOSIS — Z5982 Transportation insecurity: Secondary | ICD-10-CM

## 2023-08-30 DIAGNOSIS — L8990 Pressure ulcer of unspecified site, unspecified stage: Secondary | ICD-10-CM | POA: Diagnosis present

## 2023-08-30 DIAGNOSIS — I428 Other cardiomyopathies: Secondary | ICD-10-CM | POA: Diagnosis present

## 2023-08-30 DIAGNOSIS — E8770 Fluid overload, unspecified: Principal | ICD-10-CM | POA: Diagnosis present

## 2023-08-30 DIAGNOSIS — I959 Hypotension, unspecified: Secondary | ICD-10-CM | POA: Diagnosis present

## 2023-08-30 DIAGNOSIS — Z7901 Long term (current) use of anticoagulants: Secondary | ICD-10-CM

## 2023-08-30 DIAGNOSIS — E874 Mixed disorder of acid-base balance: Secondary | ICD-10-CM | POA: Diagnosis present

## 2023-08-30 DIAGNOSIS — Z79899 Other long term (current) drug therapy: Secondary | ICD-10-CM

## 2023-08-30 DIAGNOSIS — I5082 Biventricular heart failure: Secondary | ICD-10-CM | POA: Diagnosis present

## 2023-08-30 DIAGNOSIS — Z8701 Personal history of pneumonia (recurrent): Secondary | ICD-10-CM

## 2023-08-30 DIAGNOSIS — Z79890 Hormone replacement therapy (postmenopausal): Secondary | ICD-10-CM

## 2023-08-30 DIAGNOSIS — Z753 Unavailability and inaccessibility of health-care facilities: Secondary | ICD-10-CM

## 2023-08-30 DIAGNOSIS — Z8249 Family history of ischemic heart disease and other diseases of the circulatory system: Secondary | ICD-10-CM

## 2023-08-30 DIAGNOSIS — Z7985 Long-term (current) use of injectable non-insulin antidiabetic drugs: Secondary | ICD-10-CM

## 2023-08-30 DIAGNOSIS — D631 Anemia in chronic kidney disease: Secondary | ICD-10-CM | POA: Diagnosis present

## 2023-08-30 DIAGNOSIS — Z5971 Insufficient health insurance coverage: Secondary | ICD-10-CM

## 2023-08-30 DIAGNOSIS — Z888 Allergy status to other drugs, medicaments and biological substances status: Secondary | ICD-10-CM

## 2023-08-30 DIAGNOSIS — Z139 Encounter for screening, unspecified: Secondary | ICD-10-CM

## 2023-08-30 DIAGNOSIS — M21379 Foot drop, unspecified foot: Secondary | ICD-10-CM | POA: Diagnosis present

## 2023-08-30 DIAGNOSIS — R0602 Shortness of breath: Principal | ICD-10-CM | POA: Diagnosis present

## 2023-08-30 LAB — CBC WITH DIFFERENTIAL/PLATELET
Abs Immature Granulocytes: 0.02 10*3/uL (ref 0.00–0.07)
Basophils Absolute: 0.1 10*3/uL (ref 0.0–0.1)
Basophils Relative: 1 %
Eosinophils Absolute: 0.3 10*3/uL (ref 0.0–0.5)
Eosinophils Relative: 4 %
HCT: 30.5 % — ABNORMAL LOW (ref 39.0–52.0)
Hemoglobin: 9.8 g/dL — ABNORMAL LOW (ref 13.0–17.0)
Immature Granulocytes: 0 %
Lymphocytes Relative: 13 %
Lymphs Abs: 1 10*3/uL (ref 0.7–4.0)
MCH: 32.9 pg (ref 26.0–34.0)
MCHC: 32.1 g/dL (ref 30.0–36.0)
MCV: 102.3 fL — ABNORMAL HIGH (ref 80.0–100.0)
Monocytes Absolute: 0.4 10*3/uL (ref 0.1–1.0)
Monocytes Relative: 5 %
Neutro Abs: 6 10*3/uL (ref 1.7–7.7)
Neutrophils Relative %: 77 %
Platelets: 241 10*3/uL (ref 150–400)
RBC: 2.98 MIL/uL — ABNORMAL LOW (ref 4.22–5.81)
RDW: 16.2 % — ABNORMAL HIGH (ref 11.5–15.5)
WBC: 7.8 10*3/uL (ref 4.0–10.5)
nRBC: 0 % (ref 0.0–0.2)

## 2023-08-30 LAB — RENAL FUNCTION PANEL
Albumin: 3.4 g/dL — ABNORMAL LOW (ref 3.5–5.0)
Anion gap: 24 — ABNORMAL HIGH (ref 5–15)
BUN: 87 mg/dL — ABNORMAL HIGH (ref 6–20)
CO2: 17 mmol/L — ABNORMAL LOW (ref 22–32)
Calcium: 9.5 mg/dL (ref 8.9–10.3)
Chloride: 92 mmol/L — ABNORMAL LOW (ref 98–111)
Creatinine, Ser: 15.15 mg/dL — ABNORMAL HIGH (ref 0.61–1.24)
GFR, Estimated: 4 mL/min — ABNORMAL LOW (ref >60)
Glucose, Bld: 91 mg/dL (ref 70–99)
Phosphorus: 9.3 mg/dL — ABNORMAL HIGH (ref 2.5–4.6)
Potassium: 4.6 mmol/L (ref 3.5–5.1)
Sodium: 133 mmol/L — ABNORMAL LOW (ref 135–145)

## 2023-08-30 LAB — I-STAT VENOUS BLOOD GAS, ED
Acid-base deficit: 4 mmol/L — ABNORMAL HIGH (ref 0.0–2.0)
Bicarbonate: 19.2 mmol/L — ABNORMAL LOW (ref 20.0–28.0)
Calcium, Ion: 0.97 mmol/L — ABNORMAL LOW (ref 1.15–1.40)
HCT: 30 % — ABNORMAL LOW (ref 39.0–52.0)
Hemoglobin: 10.2 g/dL — ABNORMAL LOW (ref 13.0–17.0)
O2 Saturation: 99 %
Potassium: 6.5 mmol/L (ref 3.5–5.1)
Sodium: 129 mmol/L — ABNORMAL LOW (ref 135–145)
TCO2: 20 mmol/L — ABNORMAL LOW (ref 22–32)
pCO2, Ven: 28.9 mm[Hg] — ABNORMAL LOW (ref 44–60)
pH, Ven: 7.431 — ABNORMAL HIGH (ref 7.25–7.43)
pO2, Ven: 151 mm[Hg] — ABNORMAL HIGH (ref 32–45)

## 2023-08-30 LAB — GLUCOSE, CAPILLARY: Glucose-Capillary: 128 mg/dL — ABNORMAL HIGH (ref 70–99)

## 2023-08-30 LAB — COMPREHENSIVE METABOLIC PANEL
ALT: 26 U/L (ref 0–44)
AST: 22 U/L (ref 15–41)
Albumin: 3.5 g/dL (ref 3.5–5.0)
Alkaline Phosphatase: 141 U/L — ABNORMAL HIGH (ref 38–126)
Anion gap: 22 — ABNORMAL HIGH (ref 5–15)
BUN: 88 mg/dL — ABNORMAL HIGH (ref 6–20)
CO2: 17 mmol/L — ABNORMAL LOW (ref 22–32)
Calcium: 9.7 mg/dL (ref 8.9–10.3)
Chloride: 95 mmol/L — ABNORMAL LOW (ref 98–111)
Creatinine, Ser: 15.05 mg/dL — ABNORMAL HIGH (ref 0.61–1.24)
GFR, Estimated: 4 mL/min — ABNORMAL LOW (ref >60)
Glucose, Bld: 104 mg/dL — ABNORMAL HIGH (ref 70–99)
Potassium: 4.6 mmol/L (ref 3.5–5.1)
Sodium: 134 mmol/L — ABNORMAL LOW (ref 135–145)
Total Bilirubin: 1.3 mg/dL — ABNORMAL HIGH (ref 0.0–1.2)
Total Protein: 7.1 g/dL (ref 6.5–8.1)

## 2023-08-30 LAB — CBG MONITORING, ED: Glucose-Capillary: 97 mg/dL (ref 70–99)

## 2023-08-30 LAB — HEPATITIS B SURFACE ANTIGEN: Hepatitis B Surface Ag: NONREACTIVE

## 2023-08-30 LAB — I-STAT CHEM 8, ED
BUN: 91 mg/dL — ABNORMAL HIGH (ref 6–20)
Calcium, Ion: 1.04 mmol/L — ABNORMAL LOW (ref 1.15–1.40)
Chloride: 98 mmol/L (ref 98–111)
Creatinine, Ser: 16 mg/dL — ABNORMAL HIGH (ref 0.61–1.24)
Glucose, Bld: 102 mg/dL — ABNORMAL HIGH (ref 70–99)
HCT: 33 % — ABNORMAL LOW (ref 39.0–52.0)
Hemoglobin: 11.2 g/dL — ABNORMAL LOW (ref 13.0–17.0)
Potassium: 4.5 mmol/L (ref 3.5–5.1)
Sodium: 131 mmol/L — ABNORMAL LOW (ref 135–145)
TCO2: 19 mmol/L — ABNORMAL LOW (ref 22–32)

## 2023-08-30 LAB — RESP PANEL BY RT-PCR (RSV, FLU A&B, COVID)  RVPGX2
Influenza A by PCR: NEGATIVE
Influenza B by PCR: NEGATIVE
Resp Syncytial Virus by PCR: NEGATIVE
SARS Coronavirus 2 by RT PCR: NEGATIVE

## 2023-08-30 LAB — DIGOXIN LEVEL: Digoxin Level: 0.4 ng/mL — ABNORMAL LOW (ref 0.8–2.0)

## 2023-08-30 LAB — MRSA NEXT GEN BY PCR, NASAL: MRSA by PCR Next Gen: NOT DETECTED

## 2023-08-30 LAB — BRAIN NATRIURETIC PEPTIDE: B Natriuretic Peptide: 1162.6 pg/mL — ABNORMAL HIGH (ref 0.0–100.0)

## 2023-08-30 MED ORDER — INSULIN ASPART 100 UNIT/ML IJ SOLN: 0.0000 [IU] | Freq: Three times a day (TID) | INTRAMUSCULAR | Status: DC

## 2023-08-30 MED ORDER — CHLORHEXIDINE GLUCONATE CLOTH 2 % EX PADS: 6.0000 | MEDICATED_PAD | Freq: Every day | CUTANEOUS | Status: DC

## 2023-08-30 MED ORDER — LANTHANUM CARBONATE 500 MG PO CHEW
1000.0000 mg | CHEWABLE_TABLET | Freq: Three times a day (TID) | ORAL | Status: DC
  Administered 2023-08-30: 1000 mg via ORAL
  Filled 2023-08-30 (×5): qty 2

## 2023-08-30 MED ORDER — ALBUTEROL SULFATE (2.5 MG/3ML) 0.083% IN NEBU
10.0000 mg | INHALATION_SOLUTION | Freq: Once | RESPIRATORY_TRACT | Status: AC
Start: 2023-08-30 — End: 2023-08-30
  Administered 2023-08-30: 10 mg via RESPIRATORY_TRACT
  Filled 2023-08-30: qty 12

## 2023-08-30 MED ORDER — LEVOTHYROXINE SODIUM 25 MCG PO TABS
25.0000 ug | ORAL_TABLET | Freq: Every day | ORAL | Status: DC
Start: 2023-08-30 — End: 2023-09-07
  Administered 2023-08-30 – 2023-09-06 (×8): 25 ug via ORAL
  Filled 2023-08-30 (×8): qty 1

## 2023-08-30 MED ORDER — ALTEPLASE 2 MG IJ SOLR: 2.0000 mg | Freq: Once | INTRAMUSCULAR | Status: DC | PRN

## 2023-08-30 MED ORDER — HYDROMORPHONE HCL 2 MG PO TABS
4.0000 mg | ORAL_TABLET | Freq: Four times a day (QID) | ORAL | Status: DC | PRN
  Administered 2023-08-30 – 2023-09-06 (×20): 4 mg via ORAL
  Filled 2023-08-30 (×21): qty 2

## 2023-08-30 MED ORDER — PREGABALIN 25 MG PO CAPS
50.0000 mg | ORAL_CAPSULE | Freq: Every day | ORAL | Status: DC
  Administered 2023-08-30 – 2023-09-06 (×8): 50 mg via ORAL
  Filled 2023-08-30 (×8): qty 2

## 2023-08-30 MED ORDER — DIGOXIN 125 MCG PO TABS
0.1250 mg | ORAL_TABLET | ORAL | Status: DC
  Administered 2023-08-31 – 2023-09-05 (×3): 0.125 mg via ORAL
  Filled 2023-08-30 (×4): qty 1

## 2023-08-30 MED ORDER — DARBEPOETIN ALFA 150 MCG/0.3ML IJ SOSY
150.0000 ug | PREFILLED_SYRINGE | INTRAMUSCULAR | Status: DC
  Filled 2023-08-30: qty 0.3

## 2023-08-30 MED ORDER — INSULIN ASPART 100 UNIT/ML IJ SOLN: 0.0000 [IU] | Freq: Every day | INTRAMUSCULAR | Status: DC

## 2023-08-30 MED ORDER — CHOLECALCIFEROL 10 MCG (400 UNIT) PO TABS
400.0000 [IU] | ORAL_TABLET | Freq: Every day | ORAL | Status: DC
Start: 2023-08-30 — End: 2023-09-07
  Administered 2023-08-30 – 2023-09-06 (×8): 400 [IU] via ORAL
  Filled 2023-08-30 (×10): qty 1

## 2023-08-30 MED ORDER — ANTICOAGULANT SODIUM CITRATE 4% (200MG/5ML) IV SOLN
5.0000 mL | Status: DC | PRN
  Administered 2023-08-30: 3.8 mL
  Filled 2023-08-30: qty 5

## 2023-08-30 MED ORDER — LIDOCAINE HCL (PF) 1 % IJ SOLN: 5.0000 mL | INTRAMUSCULAR | Status: DC | PRN

## 2023-08-30 MED ORDER — APIXABAN 5 MG PO TABS
5.0000 mg | ORAL_TABLET | Freq: Two times a day (BID) | ORAL | Status: DC
  Administered 2023-08-30 – 2023-09-06 (×15): 5 mg via ORAL
  Filled 2023-08-30 (×15): qty 1

## 2023-08-30 MED ORDER — INSULIN ASPART 100 UNIT/ML IV SOLN: 10.0000 [IU] | Freq: Once | INTRAVENOUS | Status: DC

## 2023-08-30 MED ORDER — ORAL CARE MOUTH RINSE: 15.0000 mL | OROMUCOSAL | Status: DC | PRN

## 2023-08-30 MED ORDER — PENTAFLUOROPROP-TETRAFLUOROETH EX AERO: 1.0000 | INHALATION_SPRAY | CUTANEOUS | Status: DC | PRN

## 2023-08-30 MED ORDER — DEXTROSE 50 % IV SOLN
1.0000 | Freq: Once | INTRAVENOUS | Status: DC
Start: 2023-08-30 — End: 2023-08-30

## 2023-08-30 MED ORDER — MIDODRINE HCL 5 MG PO TABS
30.0000 mg | ORAL_TABLET | Freq: Three times a day (TID) | ORAL | Status: DC
  Filled 2023-08-30 (×8): qty 6

## 2023-08-30 MED ORDER — VENLAFAXINE HCL ER 37.5 MG PO CP24
37.5000 mg | ORAL_CAPSULE | Freq: Every day | ORAL | Status: DC
  Administered 2023-08-30 – 2023-09-06 (×8): 37.5 mg via ORAL
  Filled 2023-08-30 (×8): qty 1

## 2023-08-30 MED ORDER — LIDOCAINE-PRILOCAINE 2.5-2.5 % EX CREA: 1.0000 | TOPICAL_CREAM | CUTANEOUS | Status: DC | PRN

## 2023-08-30 NOTE — ED Notes (Signed)
Internal med at patient bedside.

## 2023-08-30 NOTE — Progress Notes (Signed)
   08/30/23 1840  Vitals  Temp 97.9 F (36.6 C)  Pulse Rate 99  Resp 20  BP 106/77  SpO2 100 %  O2 Device Nasal Cannula  Weight (!) 161.8 kg  Type of Weight Post-Dialysis  Oxygen  Therapy  O2 Flow Rate (L/min) 5 L/min  Patient Activity (if Appropriate) In bed  Pulse Oximetry Type Continuous  Oximetry Probe Site Changed No  Post Treatment  Dialyzer Clearance Lightly streaked  Hemodialysis Intake (mL) 0 mL  Liters Processed 91.5  Fluid Removed (mL) 6000 mL  Tolerated HD Treatment Yes   Received patient in bed to unit.  Alert and oriented.  Informed consent signed and in chart.   TX duration:4.0  Patient tolerated well.  Transported back to the room  Alert, without acute distress.  Hand-off given to patient's nurse.   Access used: RIJDLC Access issues: no complications  Total UF removed: 6000 Medication(s) given: none   [facility]

## 2023-08-30 NOTE — Consult Note (Signed)
 Renal Service Consult Note [facility]  Phillip Young 08/30/2023 Phillip JONETTA Fret, MD Requesting Physician: Dr. Eben  Reason for Consult:  HPI: The patient is a 38 y.o. year-old w/ PMH as below who presented to ED c/o SOB and gen'd malaise since missing dialysis treatments. Pt states has been having troubles w/ transportation. Also back pain. In ED HR 100-120, afib, BP's are good and K 4.6, creat 16, Na 133. Pt is to be admitted. We are asked to see for dialysis.   Pt seen in ED. Main c/o is gen malaise, feeling bad which he attributes to missing HD. SOB. Is on 2-3 L [place] O2 at home. No other c/o's.    ROS - denies CP, no joint pain, no HA, no blurry vision, no rash, no diarrhea, no nausea/ vomiting, no dysuria, no difficulty voiding   Past Medical History  Past Medical History:  Diagnosis Date   Acute on chronic respiratory failure with hypoxia ([name]) 04/21/2021   Acute on chronic systolic (congestive) heart failure ([name]) 02/26/2020   Amiodarone  toxicity    Anemia    Atrial flutter ([name])    Biventricular congestive heart failure ([name])    Chronic hypoxemic respiratory failure ([name])    Class 3 severe obesity due to excess calories with serious comorbidity and body mass index (BMI) of 50.0 to 59.9 in adult ([name]) 02/26/2020   Essential hypertension 02/26/2020   GERD without esophagitis 02/26/2020   Hidradenitis suppurativa 02/26/2020   NICM (nonischemic cardiomyopathy) ([name])    Obesity hypoventilation syndrome ([name])    OSA (obstructive sleep apnea)    PAF (paroxysmal atrial fibrillation) ([name])    Pneumonia    Prediabetes 02/26/2020   Past Surgical History  Past Surgical History:  Procedure Laterality Date   ABSCESS DRAINAGE     AV FISTULA PLACEMENT Left 08/21/2021   Procedure: LEFT ARM ARTERIOVENOUS (AV) FISTULA.;  Surgeon: Serene Gaile ORN, MD;  Location: [facility] OR;  Service: Vascular;  Laterality: Left;   CARDIAC CATHETERIZATION     CARDIOVERSION N/A  10/09/2021   Procedure: CARDIOVERSION;  Surgeon: Rolan Ezra RAMAN, MD;  Location: [facility] ENDOSCOPY;  Service: Cardiovascular;  Laterality: N/A;   CARDIOVERSION N/A 05/28/2022   Procedure: CARDIOVERSION;  Surgeon: Rolan Ezra RAMAN, MD;  Location: [facility] ENDOSCOPY;  Service: Cardiovascular;  Laterality: N/A;   CARDIOVERSION N/A 06/07/2022   Procedure: CARDIOVERSION;  Surgeon: Rolan Ezra RAMAN, MD;  Location: [facility] ENDOSCOPY;  Service: Cardiovascular;  Laterality: N/A;   IR FLUORO GUIDE CV LINE RIGHT  03/10/2021   IR FLUORO GUIDE CV LINE RIGHT  04/22/2021   IR FLUORO GUIDE CV LINE RIGHT  08/20/2021   IR FLUORO GUIDE CV LINE RIGHT  03/01/2023   IR FLUORO GUIDE CV LINE RIGHT  03/09/2023   IR FLUORO GUIDE CV LINE RIGHT  05/30/2023   IR PTA VENOUS EXCEPT DIALYSIS CIRCUIT  03/09/2023   IR REMOVAL TUN CV CATH W/O FL  05/26/2023   IR REMOVE CV FIBRIN SHEATH  03/09/2023   IR US  GUIDE VASC ACCESS RIGHT  03/10/2021   IR US  GUIDE VASC ACCESS RIGHT  04/22/2021   IR US  GUIDE VASC ACCESS RIGHT  05/30/2023   RIGHT HEART CATH N/A 03/06/2021   Procedure: RIGHT HEART CATH;  Surgeon: Cherrie Toribio SAUNDERS, MD;  Location: [facility] INVASIVE CV LAB;  Service: Cardiovascular;  Laterality: N/A;   RIGHT/LEFT HEART CATH AND CORONARY ANGIOGRAPHY N/A 03/04/2020   Procedure: RIGHT/LEFT HEART CATH AND CORONARY ANGIOGRAPHY;  Surgeon: Rolan Ezra RAMAN, MD;  Location: [facility]  INVASIVE CV LAB;  Service: Cardiovascular;  Laterality: N/A;   TEE WITHOUT CARDIOVERSION N/A 05/05/2021   Procedure: TRANSESOPHAGEAL ECHOCARDIOGRAM (TEE);  Surgeon: Rolan Ezra RAMAN, MD;  Location: [facility] ENDOSCOPY;  Service: Cardiovascular;  Laterality: N/A;   TEMPORARY DIALYSIS CATHETER  03/06/2021   Procedure: TEMPORARY DIALYSIS CATHETER;  Surgeon: Cherrie Toribio SAUNDERS, MD;  Location: [facility] INVASIVE CV LAB;  Service: Cardiovascular;;   Family History  Family History  Problem Relation Age of Onset   Heart disease Mother    Hypertension Mother    Pulmonary Hypertension Mother    Drug  abuse Father        died due to Heroin overdose   Social History  reports that he quit smoking about 6 years ago. His smoking use included cigarettes. He has been exposed to tobacco smoke. He has quit using smokeless tobacco. He reports that he does not drink alcohol  and does not use drugs. 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 Authorizing Provider  apixaban  (ELIQUIS ) 5 MG TABS tablet Take 1 tablet (5 mg total) by mouth 2 (two) times daily. 06/03/23  Yes Juberg, Lonni, DO  cholecalciferol  (VITAMIN D3) 10 MCG (400 UNIT) TABS tablet Take 1 tablet (400 Units total) by mouth daily. 06/20/23  Yes Setzer, Nena PARAS, PA-C  Cinacalcet  HCl (SENSIPAR  PO) Take 30 mg by mouth daily. 08/12/23 08/10/24 Yes [provider]  Darbepoetin Alfa  (ARANESP ) 150 MCG/0.3ML SOSY injection Inject 0.3 mLs (150 mcg total) into the skin every Saturday at 6 PM. Patient taking differently: Inject 150 mcg into the skin once a week. 06/04/23  Yes Juberg, Lonni, DO  digoxin  (LANOXIN ) 0.125 MG tablet Take 1 tablet (0.125 mg total) by mouth 3 (three) times a week. Monday, Wednesday, Friday (dialysis days) 07/22/23  Yes Jolaine Pac, DO  HYDROmorphone  (DILAUDID ) 4 MG tablet Take 4 mg by mouth every 6 (six) hours as needed for severe pain (pain score 7-10).   Yes [provider]  lanthanum  (FOSRENOL ) 1000 MG chewable tablet Chew 1 tablet (1,000 mg total) by mouth 3 (three) times daily with  meals. 07/05/23  Yes Francella Rogue, MD  levothyroxine  (SYNTHROID ) 25 MCG tablet Take 1 tablet (25 mcg total) by mouth daily at 6 (six) AM. 07/21/23  Yes Jolaine Pac, DO  lidocaine  (XYLOCAINE ) 5 % ointment Apply 1 Application topically as needed. Patient taking differently: Apply 1 Application topically as needed (for port access). 06/30/23  Yes Juberg, Lonni, DO  midodrine  (PROAMATINE ) 10 MG tablet Take 3 tablets (30 mg total) by mouth 3 (three) times daily with meals. 06/20/23  Yes Setzer, Sandra J, PA-C  Nystatin (GERHARDT'S BUTT CREAM) CREA Apply 1 Application topically 3 (three) times daily. 05/24/23  Yes Juberg, Lonni, DO  ondansetron  (ZOFRAN ) 4 MG tablet Take 1 tablet (4 mg total) by mouth daily as needed for nausea or vomiting. 06/20/23 06/19/24 Yes Setzer, Nena PARAS, PA-C  pregabalin  (LYRICA ) 50 MG capsule Take  1 capsule (50 mg total) by mouth daily. Take one capsule daily. 07/25/23  Yes Addie Perkins, DO  venlafaxine  XR (EFFEXOR  XR) 37.5 MG 24 hr capsule Take 1 capsule (37.5 mg total) by mouth daily. 07/07/23 07/06/24 Yes Juberg, Christopher, DO  leptospermum manuka honey (MEDIHONEY) PSTE paste Apply 1 Application topically daily. Patient not taking: Reported on 08/30/2023 05/25/23   [name], DO  oxyCODONE  HCl 10 MG TABA Take 10 mg by mouth every 6 (six) hours as needed. Patient not taking: Reported on 08/30/2023 09/07/23   Gabino Boga, MD  tirzepatide  (ZEPBOUND ) 2.5 MG/0.5ML injection vial Inject 2.5 mg into the skin once a week. Patient not taking: Reported on 08/30/2023 08/25/23   Gabino Boga, MD     Vitals:   08/30/23 9554 08/30/23 0528 08/30/23 0915 08/30/23 1216  BP: 123/70  125/77 107/69  Pulse: 100  (!) 108 (!) 117  Resp: (!) 32  (!) 25 (!) 22  Temp:   98.1 F (36.7 C) 98.7 F (37.1 C)  TempSrc:   Oral Oral  SpO2: 99% 95% 100% 97%  Weight:      Height:       Exam Gen alert, no distress, [place] O2 No rash, cyanosis or gangrene Sclera  anicteric, throat clear  No jvd or bruits Chest clear bilat to bases, no rales/ wheezing RRR no RG Abd soft ntnd no mass or ascites +bs GU nl male MS no joint effusions or deformity Ext 2+ diffuse bilat pretib edema, no other edema Neuro is alert, Ox 3 , nf    RIJ TDC       Renal-related home meds: - sensipar  30 every day - fosrenol  1 gm ac tid - midodrine  30 tid - others: effexor  xr, lyrica , T4, digoxin , eliquis     OP HD: [facility] MWF From dec 2024 -->  4.5h   2/2 bath  155.5kg  RIJ TDC  Heparin  none    Assessment/ Plan: Acute on chronic hypoxic and hypercarbic resp failure - likely due to vol overload from missed HD. Is on 2-3  [place] O2 at home. Plan is for HD today.  [facility] - on HD MWF. Last HD was Wed 1/29. HD as above.  Chronic hypotension - on high dose midodrine  30 tid, continue.  Volume - as above has sig LE edema from missed HD.  Anemia of esrd - Hb 10-12 here. Follow. Get records.  Secondary hyperparathyroidism - CCa in range, phos is high. Cont binders w/ meals and sensipar .  HFrEDF - low EF of 30-35% last check in oct 2024 Atrial fib - on digoxin , eliquis  Chronic resp failure - felt to be due to amio lung toxicity.      Myer Fret  MD [facility] 08/30/2023, 12:24 PM  Recent Labs  Lab 08/30/23 0250 08/30/23 0257 08/30/23 0301 08/30/23 0540  HGB 9.8* 11.2* 10.2*  --   ALBUMIN  3.5  --   --  3.4*  CALCIUM  9.7  --   --  9.5  PHOS  --   --   --  9.3*  CREATININE 15.05* 16.00*  --  15.15*  K 4.6 4.5 6.5* 4.6   Inpatient medications:  apixaban   5 mg Oral BID   Chlorhexidine  Gluconate Cloth  6 each Topical Q0600   cholecalciferol   400 Units Oral Daily   [START ON 09/03/2023] Darbepoetin Alfa   150 mcg Subcutaneous Q Sat-1800   [START ON 08/31/2023] digoxin   0.125 mg Oral Once per day on Monday Wednesday Friday   insulin  aspart  0-5 Units Subcutaneous QHS   insulin  aspart  0-6 Units Subcutaneous TID WC   lanthanum   1,000 mg Oral TID WC   levothyroxine   25 mcg Oral Q0600    midodrine   30 mg Oral TID WC   pregabalin   50 mg Oral Daily   venlafaxine  XR  37.5 mg Oral Daily    anticoagulant sodium citrate      alteplase , anticoagulant sodium citrate , HYDROmorphone , lidocaine  (PF), lidocaine -prilocaine , pentafluoroprop-tetrafluoroeth

## 2023-08-30 NOTE — ED Notes (Signed)
Patient unable to tolerate continuous neb tx; patient states "it's making him cough." This RN notified RRT.

## 2023-08-30 NOTE — ED Triage Notes (Signed)
 Pt bib [facility] coming from home w/ c/o shortness of breath. Last dialysis was Wednesday (MWF), been having problems with transportation and insurance. 3-4 days, shortness of breath, extremity edema, cramping sensation around kidneys and hands. Pt also has complaint of back pain. EMS reports patient is alert and oriented.   EMS VS: 100% on 5L, baseline 110/70 74 HR

## 2023-08-30 NOTE — H&P (Addendum)
 Date: 08/30/2023               Patient Name:  Phillip Young MRN: [id number]  DOB: 12/30/1985 Age / Sex: 38 y.o., male   PCP: [name], DO         Medical Service: Internal Medicine Teaching Service         Attending Physician: Dr. Eben Reyes BROCKS, MD      First Contact: Dr. Cheron Mallard, MD Pager [phone]    Second Contact: Dr. Hadassah Ala, MD Pager [phone]         After Hours (After 5p/  First Contact Pager: [phone]  weekends / holidays): Second Contact Pager: [phone]   SUBJECTIVE   Chief Complaint: Missed Dialysis sessions due to transportation issues   History of Present Illness:   This is a 38 year old male with past medical history of ESRD on HD MWF, chronic hypoxic respiratory failure 2/2 suspected amiodarone  toxicity at baseline 2 L oxygen , chronic pain/neuropathy, HFrEF (EF 30-35%), A-fib on Eliquis  5 mg twice daily, anemia of chronic disease, history of decubitus ulcer who presents to the ED today after missing HD sessions on Friday 1/31 and Monday 2/3, last session on Wednesday with intermittent episodes of missing HD sessions since last hospital discharge on 07/05/2023.   This patient is well known to our services who presents with complicated social history related to his transportation for dialysis sessions. Patient reports since he was discharge on 07/05/2023, he initially did well with attending dialysis sessions. The transportation services would help him with taking him from his basement (where he lives in his mother's house), about 3 weeks ago, he had change in [place].  He states that his transportation services were changed to another services, and the new services would not accommodate his living situation.  He states that for the past 3 weeks, there are days where the new transportation services will help him come to his house and bring him from the basement to the vehicle however there were days  that they were unable to do so and he had to miss dialysis sessions.  He states that his uncle is a primary person who has been taking him to the dialysis sessions when he is unable to go with transportation. States otherwise he is very consistent with dialysis sessions, he has missed intermittent sessions however he is able to make it up. However he reports that he missed his last 2 dialysis session, his last session was on Wednesday 1/29; and the transportation services did not come to pick him up today.  States that he was concerned about his elevated phosphorus levels.  He is only complaining of shortness of breath for the past couple of days, swelling of his bilateral lower extremities, and nausea.  Otherwise he denies any symptoms of recent fevers, chills, chest pain, abdominal pain, vomiting, urinary symptoms.  Reports normal bowel movements.  States that he is compliant on his medications.   Past Medical History: Past Medical History:  Diagnosis Date   Acute on chronic respiratory failure with hypoxia ([name]) 04/21/2021   Acute on chronic systolic (congestive) heart failure ([name]) 02/26/2020   Amiodarone  toxicity    Anemia    Atrial flutter ([name])    Biventricular congestive heart failure ([name])    Chronic hypoxemic respiratory failure ([name])    Class 3 severe obesity due to excess calories with serious comorbidity and body mass index (BMI) of 50.0 to 59.9 in adult [facility]) 02/26/2020  Essential hypertension 02/26/2020   GERD without esophagitis 02/26/2020   Hidradenitis suppurativa 02/26/2020   NICM (nonischemic cardiomyopathy) ([name])    Obesity hypoventilation syndrome ([name])    OSA (obstructive sleep apnea)    PAF (paroxysmal atrial fibrillation) ([name])    Pneumonia    Prediabetes 02/26/2020    Meds:  Current Outpatient Medications  Medication Instructions   apixaban  (ELIQUIS ) 5 mg, Oral, 2 times daily   cholecalciferol  (VITAMIN D3) 400 Units, Oral, Daily   Cinacalcet  HCl  (SENSIPAR  PO) 30 mg, Oral, Daily   Darbepoetin Alfa  (ARANESP ) 150 mcg, Subcutaneous, Every Sat (1800)   digoxin  (LANOXIN ) 0.125 mg, Oral, 3 times weekly, Monday, Wednesday, Friday (dialysis days)   HYDROmorphone  (DILAUDID ) 4 mg, Oral, Every 6 hours PRN   lanthanum  (FOSRENOL ) 1,000 mg, Oral, 3 times daily with meals   leptospermum manuka honey (MEDIHONEY) PSTE paste 1 Application, Topical, Daily   levothyroxine  (SYNTHROID ) 25 mcg, Oral, Daily   lidocaine  (XYLOCAINE ) 5 % ointment 1 Application, Topical, As needed   midodrine  (PROAMATINE ) 30 mg, Oral, 3 times daily with meals   Nystatin (GERHARDT'S BUTT CREAM) CREA 1 Application, Topical, 3 times daily   ondansetron  (ZOFRAN ) 4 mg, Oral, Daily PRN   [START ON 09/07/2023] oxyCODONE  HCl 10 mg, Oral, Every 6 hours PRN   pregabalin  (LYRICA ) 50 mg, Oral, Daily, Take one capsule daily.   tirzepatide  (ZEPBOUND ) 2.5 mg, Subcutaneous, Weekly   venlafaxine  XR (EFFEXOR  XR) 37.5 mg, Oral, Daily    Past Surgical History:  Procedure Laterality Date   ABSCESS DRAINAGE     AV FISTULA PLACEMENT Left 08/21/2021   Procedure: LEFT ARM ARTERIOVENOUS (AV) FISTULA.;  Surgeon: Serene Gaile ORN, MD;  Location: [facility] OR;  Service: Vascular;  Laterality: Left;   CARDIAC CATHETERIZATION     CARDIOVERSION N/A 10/09/2021   Procedure: CARDIOVERSION;  Surgeon: Rolan Ezra RAMAN, MD;  Location: [facility] ENDOSCOPY;  Service: Cardiovascular;  Laterality: N/A;   CARDIOVERSION N/A 05/28/2022   Procedure: CARDIOVERSION;  Surgeon: Rolan Ezra RAMAN, MD;  Location: [facility] ENDOSCOPY;  Service: Cardiovascular;  Laterality: N/A;   CARDIOVERSION N/A 06/07/2022   Procedure: CARDIOVERSION;  Surgeon: Rolan Ezra RAMAN, MD;  Location: [facility] ENDOSCOPY;  Service: Cardiovascular;  Laterality: N/A;   IR FLUORO GUIDE CV LINE RIGHT  03/10/2021   IR FLUORO GUIDE CV LINE RIGHT  04/22/2021   IR FLUORO GUIDE CV LINE RIGHT  08/20/2021   IR FLUORO GUIDE CV LINE RIGHT  03/01/2023   IR FLUORO GUIDE CV LINE RIGHT   03/09/2023   IR FLUORO GUIDE CV LINE RIGHT  05/30/2023   IR PTA VENOUS EXCEPT DIALYSIS CIRCUIT  03/09/2023   IR REMOVAL TUN CV CATH W/O FL  05/26/2023   IR REMOVE CV FIBRIN SHEATH  03/09/2023   IR US  GUIDE VASC ACCESS RIGHT  03/10/2021   IR US  GUIDE VASC ACCESS RIGHT  04/22/2021   IR US  GUIDE VASC ACCESS RIGHT  05/30/2023   RIGHT HEART CATH N/A 03/06/2021   Procedure: RIGHT HEART CATH;  Surgeon: Cherrie Toribio SAUNDERS, MD;  Location: [facility] INVASIVE CV LAB;  Service: Cardiovascular;  Laterality: N/A;   RIGHT/LEFT HEART CATH AND CORONARY ANGIOGRAPHY N/A 03/04/2020   Procedure: RIGHT/LEFT HEART CATH AND CORONARY ANGIOGRAPHY;  Surgeon: Rolan Ezra RAMAN, MD;  Location: [facility] INVASIVE CV LAB;  Service: Cardiovascular;  Laterality: N/A;   TEE WITHOUT CARDIOVERSION N/A 05/05/2021   Procedure: TRANSESOPHAGEAL ECHOCARDIOGRAM (TEE);  Surgeon: Rolan Ezra RAMAN, MD;  Location: [facility] ENDOSCOPY;  Service: Cardiovascular;  Laterality: N/A;   TEMPORARY  DIALYSIS CATHETER  03/06/2021   Procedure: TEMPORARY DIALYSIS CATHETER;  Surgeon: Cherrie Toribio SAUNDERS, MD;  Location: [facility] INVASIVE CV LAB;  Service: Cardiovascular;;    Social:  Living Situation: Mother Support: Mother is a primary caretaker Level of function: Dependent in all ADLs/IADLs PCP: [name], DO Tobacco: Denies smoking cigarettes Alcohol : Denies Drugs: Denies  Family History: noncontributory  Allergies: Allergies as of 08/30/2023 - Review Complete 08/30/2023  Allergen Reaction Noted   Amiodarone  Other (See Comments) 06/01/2022   Coreg  [carvedilol ] Shortness Of Breath and Diarrhea 07/07/2020   Heparin  Other (See Comments) 03/05/2021   Metoprolol  Other (See Comments) 08/04/2022   Amoxicillin  Other (See Comments) 01/26/2023   Other Swelling and Other (See Comments) 01/26/2023    Review of Systems: A complete ROS was negative except as per HPI.   OBJECTIVE:   Physical Exam: Blood pressure 120/69, pulse (!) 117, temperature 97.7 F (36.5  C), temperature source Oral, resp. rate (!) 34, height 6' (1.829 m), weight (!) 167.8 kg, SpO2 100%.  Constitutional: Obese, chronically ill, laying in bed, no acute distress, on 5 L [place]  HENT: normocephalic atraumatic, mucous membranes moist Cardiovascular: tachycardiac. Obvious b/l LE swelling, unable to assess pitting vs nonpitting due to pain  Pulmonary/Chest: normal work of breathing on room air, lungs clear to auscultation bilaterally Abdominal: soft, non-tender, non-distended, bowel sounds present  MSK: normal bulk and tone Neurological: alert & oriented x 3. No focal deficits  Skin: warm and dry  Labs: CBC    Component Value Date/Time   WBC 7.8 08/30/2023 0250   RBC 2.98 (L) 08/30/2023 0250   HGB 10.2 (L) 08/30/2023 0301   HGB 11.7 (L) 06/21/2023 1606   HCT 30.0 (L) 08/30/2023 0301   HCT 36.4 (L) 06/21/2023 1606   PLT 241 08/30/2023 0250   PLT 230 06/21/2023 1606   MCV 102.3 (H) 08/30/2023 0250   MCV 97 06/21/2023 1606   MCH 32.9 08/30/2023 0250   MCHC 32.1 08/30/2023 0250   RDW 16.2 (H) 08/30/2023 0250   RDW 14.3 06/21/2023 1606   LYMPHSABS 1.0 08/30/2023 0250   MONOABS 0.4 08/30/2023 0250   EOSABS 0.3 08/30/2023 0250   BASOSABS 0.1 08/30/2023 0250     CMP     Component Value Date/Time   NA 129 (L) 08/30/2023 0301   NA 134 06/21/2023 1606   K 6.5 (HH) 08/30/2023 0301   CL 98 08/30/2023 0257   CO2 17 (L) 08/30/2023 0250   GLUCOSE 102 (H) 08/30/2023 0257   BUN 91 (H) 08/30/2023 0257   BUN 41 (H) 06/21/2023 1606   CREATININE 16.00 (H) 08/30/2023 0257   CREATININE 9.17 (H) 08/26/2021 1014   CALCIUM  9.7 08/30/2023 0250   PROT 7.1 08/30/2023 0250   PROT 7.5 09/15/2021 1147   ALBUMIN  3.5 08/30/2023 0250   ALBUMIN  4.3 09/15/2021 1147   AST 22 08/30/2023 0250   ALT 26 08/30/2023 0250   ALKPHOS 141 (H) 08/30/2023 0250   BILITOT 1.3 (H) 08/30/2023 0250   BILITOT 1.1 09/15/2021 1147   GFRNONAA 4 (L) 08/30/2023 0250   GFRAA >60 03/27/2020 1055    Imaging: DG  Chest Portable 1 View Result Date: 08/30/2023 CLINICAL DATA:  Shortness of breath EXAM: PORTABLE CHEST 1 VIEW COMPARISON:  07/01/2023 FINDINGS: Right central venous catheter with tip over the mid SVC region. No pneumothorax. Cardiac enlargement. No vascular congestion, edema, or consolidation. No pleural effusion or pneumothorax. Mediastinal contours appear intact. IMPRESSION: Prominent cardiac enlargement similar to prior study. Lungs are clear.  Electronically Signed   By: Elsie Gravely M.D.   On: 08/30/2023 01:43   CXR IMPRESSION: Prominent cardiac enlargement similar to prior study. Lungs are clear  EKG: personally reviewed my interpretation is Afib with prolonged QT interval.   ASSESSMENT & PLAN:   Assessment & Plan by Problem: Principal Problem:   ESRD (end stage renal disease) on dialysis ([name])   Phillip Young is a 38 y.o. 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   ESRD on HD MWF Hyperkalemia Hyponatremia AGMA  Presented with missing two dialysis sessions Friday 1/31 and Monday 2/3, last session on Wednesday 1/29, with associated symptoms SOB x 2 days, nausea and bilateral lower extremity swelling.  Patient reports that he missed his sessions due to transportation issues. Initial labs showed K 4.6, bicarb 17, BUN 88, Cr 15.05, AG 22, BNP 1162.6. I-stat VBG findings 7.4/28.9/19.2 showed respiratory alkalosis and metabolic acidosis and elevated K 6.5. CXR without any acute findings. Last discharge weight 12/10 347 pounds,  weight today 370 pounds. He has bilateral LE swellings. Patient's weight gain is likely due to missing dialysis sessions and that is due to transportation issues, which has been a constant obstacle since the past couple of hospitalizations.  Plan: - Nephrology is consulted for urgent HD - Continue home medications Vit D3 400 units daily, Aranesp  IV 150 mcg every Saturday, lanthanum  1,000 mg TID - Follow up on repeat RFP check for potassium    SDOH This patient presents to hospital for recurrent issues about transportation that has been a major obstacle with getting dialysis sessions. He lives with his mother who is the primary caretaker; lives in the basement. In order to exit the basement, patient has 2 routes.  First he can either climb the flight of stairs from his basement to the front door or he can exit the basement door and walked up the hill to the street.  However he is unable to do either.  Patient reported that after discharge on 12/10 the transportation services were very helpful.  They were able to get him from the basement to the vehicle and to the dialysis center efficiently.  Ever since his insurance changed from [place] to [place], the transportation services were changed to new services.  And for the past 3 weeks he has been having trouble new transportation services, states that there were days they were having hard time getting him to the dialysis center which resulted in him missing dialysis sessions.  In that case, his uncle would help him in taking to his dialysis sessions.  Therefore states that he has intermittently missed his sessions.  Presently patient reports transportation services did not even come to pick him up today or Friday even though he had dialysis session scheduled.  He states that he has a consultation for electric scooter on 2/17. - TOC consult for reliable transportation services   - PT   AoCHRF HFrEF [EF 30-35%]  Last EF 30-35% in October 2024 with  moderately decreased LV function and global hypokinsas. GDMT limited due to hypotension. BNP elevated  to 1162.6, per evaluation, has a bilateral LE swelling, otherwise no obvious JVD. Volume status exam is limited to large body habitus. CXR findings negative for acute congestion. Plan to treat through HD sessions.   Afib: EKG showed Afib with tachycardia. Will continue home medication Eliquis  5 mg BID and Digoxin  0.125 mg TID.  Chronic pain/neuropathy- current home medications dilaudid  4 mg q6h prn, lyrica  50 mg daily, and Effexor  37.5 mg daily, last PCP OV note mentioned transitioning to oxycodone  10 mg every 6 hours. Consider discharging with oxycodone  instead of dilaudid .  Hypothyroidism: Will continue home medication synthroid  25 mcg daily  Hx of hypotension: Will continue home medication midodrine  30 mg TID    Diet: Renal VTE:  Eliquis   IVF: None,None Code: Full  Prior to Admission Living Arrangement: Home, living Mother  Anticipated Discharge Location: Home Barriers to Discharge: Medical Management   Dispo: Admit patient to Observation with expected length of stay less than 2 midnights.  Signed: Heddy Barren, DO Internal Medicine Resident PGY-1  08/30/2023, 6:19 AM

## 2023-08-30 NOTE — Hospital Course (Addendum)
 Phillip Young is a 38 y.o. male with multiple significant chronic conditions including ESRD with HD, HFrEF, Atrial fibrillation on Eliquis  and chronic digoxin , Chronic respiratory failure 2/2 amiodarone  toxicity, BMI 47, T2DM, and polyneuropathy admitted for volume overload due to missing HD sessions secondary to lapse in Medicaid for transportation needs, now receiving HD inpatient while working on access to outpatient transport to HD. He had no complications during his stay and will go to rehab facility to continue to work on strength.   SDOH needs: transportation to HD sessions Functional deconditioning BMI 47 Patient previously covered through [facility] now transitioned to [facility], however, unable to open a case to arrange transportation.  Per case manager, patient will be transition back to [employer] but this will not take effect until the beginning of next month.  He does have transportation through [employer].    ESRD on HD MWF Chronic hypotension ESRD anemia Improvement in patient's volume status, close to estimated dry weight. Now transitioned back to MWF schedule.  - Continue midodrine  30 mg TID - Vit D supplementation   Chronic hypoxic respiratory failure Suspected secondary to amiodarone  toxicity Home supplemental O2, 2-4L. Currently on 5L. Respiratory status improving. With dialysis, volume optimization. - Wean with goal O2 sat >90%   AoCHRF HFrEF [EF 30-35%]  Last EF 30-35% in October 2024 with moderately decreased LV function and global hypokinsas. GDMT limited due to hypotension. Volume improving with HD. No crackles, improving orthopnea - Continue with HD - Continue with Digoxin    Chronic atrial fibrillation - On digoxin  0.125 mg three times weekly - Patient is allergic to multiple therapies.  - Continue on Eliquis  BID   Chronic somatic and neuropathic pain Chronic opiate therapy - Scheduled Tylenol  100 mg QID  - Venlafaxine  37.5mg  daily   - Lyrica  50mg  daily  - Dilaudid  4mg  q6hrs PRN, will transition back to Oxycodone  outpatient soon.    Sacral decubitus ulcer, stage IV - Foam dressing needs to be in place over sacral area.  - RD consult pending.   Big Toe Wound Noted on 09/06/2023. Wrapping in place. No signs of infection or purulent drainage. Will apply foot care and abx ointment.   Hidradenitis Suppurativa  No active treatment and continues to endorse pain, which is exacerbated by limited mobility in bed - Tylenol  1000 mg q6hrs PRN  - Triamcinolone  ointment over left lateral thigh  - Cover with foam dressing.    Hypothyroidism TSH and FT4 within range - Continue Levothyroxine  25 mcg daily

## 2023-08-30 NOTE — Progress Notes (Addendum)
 HD#0 SUBJECTIVE:  Patient Summary: Phillip Young is a 38 y.o. with a pertinent PMH of ESRD, HFrEF, Afib on eliquis , chronic respiratory failure, and sacral decubitus ulcer coming in after missing a couple of dialysis sessions secondary to transportation issues admitted for ESRD needing dyalisis.   Interim History: Insurance switched and he does not have the ability to get out of  his apartment.   OBJECTIVE:  Vital Signs: Vitals:   08/30/23 0528 08/30/23 0915 08/30/23 1216 08/30/23 1253  BP:  125/77 107/69 (!) 123/90  Pulse:  (!) 108 (!) 117 (!) 109  Resp:  (!) 25 (!) 22 20  Temp:  98.1 F (36.7 C) 98.7 F (37.1 C)   TempSrc:  Oral Oral   SpO2: 95% 100% 97% 100%  Weight:      Height:       Supplemental O2: Nasal Cannula SpO2: 100 % O2 Flow Rate (L/min): 5 L/min  Filed Weights   08/30/23 0103  Weight: (!) 167.8 kg    No intake or output data in the 24 hours ending 08/30/23 1306 Net IO Since Admission: No IO data has been entered for this period [08/30/23 1306]  Physical Exam: Physical Exam Cardiovascular:     Rate and Rhythm: Normal rate. Rhythm irregular.  Pulmonary:     Effort: Pulmonary effort is normal. No tachypnea, accessory muscle usage or respiratory distress.  Abdominal:     General: Abdomen is protuberant.     Palpations: Abdomen is soft. There is no fluid wave.     Tenderness: There is no abdominal tenderness. There is no guarding.  Musculoskeletal:     Right lower leg: Edema present.     Left lower leg: Edema present.  Neurological:     Mental Status: He is oriented to person, place, and time.    Patient Lines/Drains/Airways Status     Active Line/Drains/Airways     Name Placement date Placement time Site Days   Peripheral IV 08/30/23 20 G 1.88 Anterior;Proximal;Right Forearm 08/30/23  0241  Forearm  less than 1   Hemodialysis Catheter Right Internal jugular Double lumen Permanent (Tunneled) 05/30/23  0856  Internal jugular  92   Pressure  Injury 07/05/23 Buttocks Right Stage 3 -  Full thickness tissue loss. Subcutaneous fat may be visible but bone, tendon or muscle are NOT exposed. 07/05/23  --  -- 56   Wound / Incision (Open or Dehisced) 07/02/23 Non-pressure wound Buttocks Right Stage 2 Decubitus Ulcer near coccyx, Rt of midline 07/02/23  1602  Buttocks  59             ASSESSMENT/PLAN:  Assessment: Principal Problem:   ESRD (end stage renal disease) on dialysis ([name]) Active Problems:   SOB (shortness of breath)   Plan:  ESRD on HD MWF Chronic Hypotension  Repeat K is back at 4.6. Cr is elevated at 15.1 with a BUN of 87. G is 24. Pt to get dialysis today. Na 133. Phos 9.3 and Ca 9.9 -Appreciate Nephrology, HD and meds today per nephrology -Monitor electrolytes -cw Midodrine  30mg  TID  -Vitamin D  supplementation   SDOH affecting healthcare  Will need transportation to HD.  - TOC consult for reliable transportation services   - PT    AoCHRF HFrEF [EF 30-35%]  Last EF 30-35% in October 2024 with moderately decreased LV function and global hypokinsas. GDMT limited due to hypotension. BNP elevated to 1162.6, per evaluation, has a bilateral LE swelling, otherwise no obvious JVD. Volume status exam is limited  to large body habitus. CXR findings negative for acute congestion.  -HD today   -cw Digoxin   Anemia  Likely due to ESRD. Hgb borderline around 10. MCV seems increased at 102.3 will send iron studies. RDW increased. Folate and B12 WNL in the past. Will recheck today. Could be related to chronic disease.   Sacral Decubitus Ulcer  -Stage III. Frequent repositioning, wound care, RD consult.    Paroxysmal Afib EKG showed Afib with tachycardia. Will continue home medication  -cw Eliquis  5 mg BID and Digoxin  0.125 mg TID.   Chronic pain/neuropathy Hyperanalgesic on exam. Severely tender.  -cw venlafaxine  37.5mg  daily  -cw lyrica  50mg  daily  -cw dilaudid  4mg  q6hrs PRN   Hypothyroidism: cw synthroid  25 mcg  daily   Best Practice: Diet: Renal diet VTE: Apixaban   Signature: Fayette Gasner Alexander-Savino,MD  Internal Medicine Resident, PGY-1 [facility]  Pager: [phone] 1:06 PM, 08/30/2023   Please contact the on call pager after 5 pm and on weekends at [phone].

## 2023-08-30 NOTE — Progress Notes (Signed)
 PT Cancellation Note  Patient Details Name: Phillip Young MRN: [id number] DOB: [birth year]   Cancelled Treatment:    Reason Eval/Treat Not Completed: PT screened, no needs identified, will sign off   PT met with pt to discuss potential PT needs. Unfortunately acute PT cannot facilitate getting a scooter. Fortunately, pt already has an outpatient appointment for wheelchair/power chair/scooter assessment 2/17.   Discussed ? Need for SNF with goal to be able to walk up the hill or the steps to get to the vehicle for dialysis transportation. Patient ambulating modified independent with RW since discharging home from [facility]. He is not interested in SNF as he does not feel it is necessary (and he is likely too high level to qualify).   Patient in agreement no skilled acute PT needs at this time. If pt remains hospitalized, recommend Mobility Team follow patient to promote continued ambulation.    Phillip Young, PT Acute Rehabilitation Services  Office [phone]   Phillip Young 08/30/2023, 10:12 AM

## 2023-08-30 NOTE — ED Notes (Signed)
Pt refusing ABG with RT after being explained the importance of ABG.

## 2023-08-30 NOTE — ED Provider Notes (Signed)
 Phillip Young EMERGENCY DEPARTMENT AT [facility] Provider Note   CSN: [id number] Arrival date & time: 08/30/23  0056     History  Chief Complaint  Patient presents with   Shortness of Breath    Phillip Young is a 38 y.o. male with a past medical history of heart failure with reduced ejection fraction, insulin -dependent diabetes, end-stage renal disease on hemodialysis, chronic pain secondary to neuropathy, persistent atrial fibrillation, obstructive sleep apnea and obesity, and chronic hypoxemic respiratory failure on 2 L of oxygen  at all times.  Patient presents emergency department complaining of shortness of breath.  He has missed several dialysis sessions and cannot tell me when his last one was.  He is on his baseline oxygen  saturation.  He complains of bilateral lower extremity edema and pain which is the same as usual.  He did take a pain medicine prior to coming in.  He has dialysis access in his right subclavian region.  He denies fevers or chest pain.   Shortness of Breath      Home Medications Prior to Admission medications   Medication Sig Start Date End Date Taking? Authorizing Provider  apixaban  (ELIQUIS ) 5 MG TABS tablet Take 1 tablet (5 mg total) by mouth 2 (two) times daily. 06/03/23   [name], DO  cholecalciferol  (VITAMIN D3) 10 MCG (400 UNIT) TABS tablet Take 1 tablet (400 Units total) by mouth daily. 06/20/23   Setzer, Sandra J, PA-C  Darbepoetin Alfa  (ARANESP ) 150 MCG/0.3ML SOSY injection Inject 0.3 mLs (150 mcg total) into the skin every Saturday at 6 PM. 06/04/23   Juberg, Bruckner, DO  digoxin  (LANOXIN ) 0.125 MG tablet Take 1 tablet (0.125 mg total) by mouth 3 (three) times a week. Monday, Wednesday, Friday (dialysis days) 07/22/23   Jolaine Pac, DO  lanthanum  (FOSRENOL ) 1000 MG chewable tablet Chew 1 tablet (1,000 mg total) by mouth 3 (three) times daily with meals. 07/05/23   Francella Rogue, MD  leptospermum manuka honey (MEDIHONEY) PSTE  paste Apply 1 Application topically daily. 05/25/23   [name], DO  levothyroxine  (SYNTHROID ) 25 MCG tablet Take 1 tablet (25 mcg total) by mouth daily at 6 (six) AM. 07/21/23   Jolaine Pac, DO  lidocaine  (XYLOCAINE ) 5 % ointment Apply 1 Application topically as needed. Patient not taking: Reported on 07/02/2023 06/30/23   [name], DO  midodrine  (PROAMATINE ) 10 MG tablet Take 3 tablets (30 mg total) by mouth 3 (three) times daily with meals. 06/20/23   Setzer, Sandra J, PA-C  Nystatin (GERHARDT'S BUTT CREAM) CREA Apply 1 Application topically 3 (three) times daily. 05/24/23   [name], DO  ondansetron  (ZOFRAN ) 4 MG tablet Take 1 tablet (4 mg total) by mouth daily as needed for nausea or vomiting. 06/20/23 06/19/24  Setzer, Nena PARAS, PA-C  oxyCODONE  HCl 10 MG TABA Take 10 mg by mouth every 6 (six) hours as needed. 09/07/23   Gabino Boga, MD  pregabalin  (LYRICA ) 50 MG capsule Take 1 capsule (50 mg total) by mouth daily. Take one capsule daily. 07/25/23   Addie Perkins, DO  tirzepatide  (ZEPBOUND ) 2.5 MG/0.5ML injection vial Inject 2.5 mg into the skin once a week. 08/25/23   Gabino Boga, MD  venlafaxine  XR (EFFEXOR  XR) 37.5 MG 24 hr capsule Take 1 capsule (37.5 mg total) by mouth daily. 07/07/23 07/06/24  [name], DO      Allergies    Amiodarone , Coreg  [carvedilol ], Heparin , Metoprolol , Amoxicillin , and Other    Review of Systems   Review of Systems  Respiratory:  Positive for shortness of breath.     Physical Exam Updated Vital Signs BP 120/69 (BP Location: Right Arm)   Pulse (!) 117   Temp 97.7 F (36.5 C) (Oral)   Resp (!) 34   Ht 6' (1.829 m)   Wt (!) 167.8 kg   SpO2 100%   BMI 50.18 kg/m  Physical Exam Vitals and nursing note reviewed.  Constitutional:      General: He is not in acute distress.    Appearance: He is well-developed. He is obese. He is ill-appearing. He is not diaphoretic.  HENT:     Head: Normocephalic  and atraumatic.  Eyes:     General: No scleral icterus.    Extraocular Movements: Extraocular movements intact.     Conjunctiva/sclera: Conjunctivae normal.     Pupils: Pupils are equal, round, and reactive to light.  Cardiovascular:     Rate and Rhythm: Normal rate and regular rhythm.     Heart sounds: Normal heart sounds.  Pulmonary:     Effort: Pulmonary effort is normal. No respiratory distress.     Breath sounds: Examination of the right-lower field reveals decreased breath sounds. Examination of the left-lower field reveals decreased breath sounds. Decreased breath sounds present.     Comments: Hoarse voice, transmitted upper airway sounds, decreased breath sounds in the bilateral lower lobes Abdominal:     Palpations: Abdomen is soft.     Tenderness: There is no abdominal tenderness.  Musculoskeletal:     Cervical back: Normal range of motion and neck supple.     Right lower leg: Tenderness present. Edema present.     Left lower leg: Tenderness present. Edema present.  Skin:    General: Skin is warm and dry.  Neurological:     Mental Status: He is alert.  Psychiatric:        Behavior: Behavior normal.     ED Results / Procedures / Treatments   Labs (all labs ordered are listed, but only abnormal results are displayed) Labs Reviewed  RESP PANEL BY RT-PCR (RSV, FLU A&B, COVID)  [id number]  CBC WITH DIFFERENTIAL/PLATELET  COMPREHENSIVE METABOLIC PANEL  BRAIN NATRIURETIC PEPTIDE  I-STAT CHEM 8, ED  I-STAT ARTERIAL BLOOD GAS, ED    EKG None  Radiology No results found.  Procedures Procedures    Medications Ordered in ED Medications - No data to display  ED Course/ Medical Decision Making/ A&P Clinical Course as of 09/01/23 0333  Tue Aug 30, 2023  0308 Potassium: 4.5 [AH]  0439 I-Stat venous blood gas, ([facility] ED, [facility], [facility])(!!) [AH]  0439 I-stat chem 8, ED (not at [facility], [facility] or [facility])(!) [AH]  0439 Comprehensive metabolic panel(!) [AH]  0439 Brain natriuretic  peptide(!) [AH]  0439 Digoxin  level(!) [AH]  0439 CBC with Differential(!) [AH]    Clinical Course User Index [AH] Arloa Chroman, PA-C                                 Medical Decision Making This patient presents to the ED for concern of sob, this involves an extensive number of treatment options, and is a complaint that carries with it a high risk of complications and morbidity.  The emergent differential diagnosis for shortness of breath includes, but is not limited to, Pulmonary edema, bronchoconstriction, Pneumonia, Pulmonary embolism, Pneumotherax/ Hemothorax, Dysrythmia, ACS.     Co morbidities:       has a past medical  history of Acute on chronic respiratory failure with hypoxia ([name]) (04/21/2021), Acute on chronic systolic (congestive) heart failure ([name]) (02/26/2020), Amiodarone  toxicity, Anemia, Atrial flutter ([name]), Biventricular congestive heart failure ([name]), Chronic hypoxemic respiratory failure ([name]), Class 3 severe obesity due to excess calories with serious comorbidity and body mass index (BMI) of 50.0 to 59.9 in adult [facility]) (02/26/2020), Essential hypertension (02/26/2020), GERD without esophagitis (02/26/2020), Hidradenitis suppurativa (02/26/2020), NICM (nonischemic cardiomyopathy) ([name]), Obesity hypoventilation syndrome ([name]), OSA (obstructive sleep apnea), PAF (paroxysmal atrial fibrillation) ([name]), Pneumonia, and Prediabetes (02/26/2020).   Social Determinants of Health:       SDOH Screenings Food Insecurity: No Food Insecurity (08/30/2023) Housing: Low Risk  (08/30/2023) Transportation Needs: Unmet Transportation Needs (08/30/2023) Utilities: Not At Risk (08/30/2023) Alcohol  Screen: Low Risk  (09/21/2022) Depression (PHQ2-9): High Risk (06/21/2023) Financial Resource Strain: Low Risk  (03/24/2023)     Received from [facility] Physical Activity: Inactive (09/21/2022) Social Connections: Moderately Isolated (03/24/2023)     Received from [facility] Stress: No  Stress Concern Present (04/21/2023)     Received from [facility] Tobacco Use: Medium Risk (08/30/2023)   Additional history:  {Additional history obtained from emr =  Lab Tests:  I Ordered, and personally interpreted labs.  The pertinent results include:  Hgb 9.8, baseline CMP shows potassium 4.6, CO2 17, creatinine and BUN elevated BNP markedly elevated at 1162  Imaging Studies:  I ordered imaging studies including chest x-ray I independently visualized and interpreted imaging which showed central venous catheter in place, cardiac enlargement, I agree with the radiologist interpretation  Cardiac Monitoring/ECG:       The patient was maintained on a cardiac monitor.  I personally viewed and interpreted the cardiac monitored which showed an underlying rhythm of: Sinus tachycardia  Medicines ordered and prescription drug management:  I ordered medication including Medications apixaban  (ELIQUIS ) tablet 5 mg (5 mg Oral Given 08/31/23 2222) Darbepoetin Alfa  (ARANESP ) injection 150 mcg (has no administration in time range) cholecalciferol  (VITAMIN D3) 10 MCG (400 UNIT) tablet 400 Units (400 Units Oral Given 08/31/23 1104) digoxin  (LANOXIN ) tablet 0.125 mg (0.125 mg Oral Given 08/31/23 0817) levothyroxine  (SYNTHROID ) tablet 25 mcg (25 mcg Oral Given 08/31/23 0802) midodrine  (PROAMATINE ) tablet 30 mg (30 mg Oral Patient Refused/Not Given 08/31/23 1649) pregabalin  (LYRICA ) capsule 50 mg (50 mg Oral Given 08/31/23 1103) venlafaxine  XR (EFFEXOR -XR) 24 hr capsule 37.5 mg (37.5 mg Oral Given 08/31/23 1103) HYDROmorphone  (DILAUDID ) tablet 4 mg (4 mg Oral Given 08/31/23 0817) insulin  aspart (novoLOG ) injection 0-6 Units ( Subcutaneous Not Given 08/31/23 1656) insulin  aspart (novoLOG ) injection 0-5 Units ( Subcutaneous Not Given 08/31/23 2213) Oral care mouth rinse (has no administration in time range) sevelamer  carbonate (RENVELA ) tablet 1,600 mg (1,600 mg Oral Given 08/31/23 1702) Chlorhexidine  Gluconate  Cloth 2 % PADS 6 each (has no administration in time range) cinacalcet  (SENSIPAR ) tablet 30 mg (has no administration in time range) albuterol  (PROVENTIL ) (2.5 MG/3ML) 0.083% nebulizer solution 10 mg (10 mg Nebulization Given 08/30/23 0527) for shortness of breath Reevaluation of the patient after these medicines showed that the patient stayed the same I have reviewed the patients home medicines and have made adjustments as needed  Test Considered:         Critical Interventions:         Consultations Obtained: Internal medicine service  Problem List / ED Course:       (R06.02) SOB (shortness of breath)  (primary encounter diagnosis)  (J96.11) Chronic respiratory failure with hypoxia ([name])   MDM:  Patient here with volume overload, shortness of breath, missed dialysis, chronic comorbidities of obesity chronic hypoventilation syndrome and social issues including difficulty accessing care due to morbid obesity and transportation issues.   Dispostion:  After consideration of the diagnostic results and the patients response to treatment, I feel that the patent would benefit from admission .    Amount and/or Complexity of Data Reviewed Labs: ordered. Decision-making details documented in ED Course. Radiology: ordered and independent interpretation performed. ECG/medicine tests: ordered and independent interpretation performed.  Risk Decision regarding hospitalization.           Final Clinical Impression(s) / ED Diagnoses Final diagnoses:  None    Rx / DC Orders ED Discharge Orders     None         Arloa Chroman, PA-C 09/01/23 9660    Carita Senior, MD 09/01/23 [phone]

## 2023-08-30 NOTE — Progress Notes (Addendum)
 9:45am: CSW received call from [name], child psychotherapist at [facility] on [address] who states patient has transportation to HD with [employer]. [name] states patient lives in an apartment that is not easily accessible for a wheelchair. [name] states there is no ramp or sidewalk to use a wheelchair on.   CSW inquired with [facility] if patient is eligible for [facility] ACO REACH waiver and he is not. Patient will require a qualifying 3 night inpatient stay to qualify for SNF.  8:15am: CSW received consult for power wheelchair and transportation needs. Unfortunately, TOC will not be able to obtain a power wheelchair for patient while he is at the hospital. [facility] will follow for assistance in setting up transportation for HD while patient is hospitalized.  Niels Portugal, MSW, LCSW Transitions of Care  Clinical Social Worker II [phone]

## 2023-08-30 NOTE — Progress Notes (Signed)
 Pt receives out-pt HD at [facility] on MWF 11:55 am chair time. Contacted by clinic HD social worker yesterday with request that if pt comes to ED to please advise providers and case management staff some additional information/concerns. Clinic social worker confirms that transportation continues to be an issue for pt due to the location of mother's residence and need to get pt up a hill to transportation. Pt started transportation services yesterday with [employer] and fleeta driver could not get pt up the hill to the [place]. Clinic social worker inquiring if snf would be an option for pt since he now has medicare and medicaid. This info was provided to attending, nephrologist, renal NP, and ED CSW. Will assist as needed.   Randine Mungo Renal Navigator [phone]

## 2023-08-30 NOTE — Progress Notes (Signed)
 NEW ADMISSION NOTE New Admission Note:   Arrival Method: from ER, pt moved self from stretcher to bed but slowly Mental Orientation: alert and oriented x 4 Telemetry: verified and on continuous pulse ox Assessment: Completed Skin: see LDA IV: PIV  Pain: none Tubes: R internal jugular HD catheter Safety Measures: Safety Fall Prevention Plan has been given, discussed and signed Admission: Completed [facility] Orientation: Patient has been orientated to the room, unit and staff.  Family: pt notified of room number  Orders have been reviewed and implemented. Will continue to monitor the patient. Call light has been placed within reach and bed alarm has been activated. Pt reports missing two HD txs d/t transportation which he reports is set up by his insurance company. In HD Now.  Tate Jerkins A Proctor-Gann, RN

## 2023-08-30 NOTE — Plan of Care (Signed)
  Problem: Education: Goal: Ability to describe self-care measures that may prevent or decrease complications (Diabetes Survival Skills Education) will improve Outcome: Progressing Goal: Individualized Educational Video(s) Outcome: Progressing   Problem: Coping: Goal: Ability to adjust to condition or change in health will improve Outcome: Progressing   Problem: Fluid Volume: Goal: Ability to maintain a balanced intake and output will improve Outcome: Progressing   Problem: Health Behavior/Discharge Planning: Goal: Ability to identify and utilize available resources and services will improve Outcome: Progressing Goal: Ability to manage health-related needs will improve Outcome: Progressing   Problem: Metabolic: Goal: Ability to maintain appropriate glucose levels will improve Outcome: Progressing   Problem: Nutritional: Goal: Maintenance of adequate nutrition will improve Outcome: Progressing Goal: Progress toward achieving an optimal weight will improve Outcome: Progressing   Problem: Skin Integrity: Goal: Risk for impaired skin integrity will decrease Outcome: Progressing   Problem: Tissue Perfusion: Goal: Adequacy of tissue perfusion will improve Outcome: Progressing   Problem: Education: Goal: Knowledge of General Education information will improve Description: Including pain rating scale, medication(s)/side effects and non-pharmacologic comfort measures Outcome: Progressing   Problem: Clinical Measurements: Goal: Ability to maintain clinical measurements within normal limits will improve Outcome: Progressing Goal: Will remain free from infection Outcome: Progressing Goal: Diagnostic test results will improve Outcome: Progressing   Problem: Coping: Goal: Level of anxiety will decrease Outcome: Progressing   Problem: Elimination: Goal: Will not experience complications related to bowel motility Outcome: Progressing Goal: Will not experience complications  related to urinary retention Outcome: Progressing   Problem: Pain Managment: Goal: General experience of comfort will improve and/or be controlled Outcome: Progressing   Problem: Safety: Goal: Ability to remain free from injury will improve Outcome: Progressing   Problem: Skin Integrity: Goal: Risk for impaired skin integrity will decrease Outcome: Progressing

## 2023-08-30 NOTE — Plan of Care (Signed)

## 2023-08-30 NOTE — Consult Note (Signed)
 WOC Nurse Consult Note: Reason for Consult: Consult requested for sacrum.  Pt is familiar to [facility] team from a recent admission on 12/10 and has a chronic Stage 3 pressure injury to the sacrum.  Attempted to assess the location and patient has just left for dialysis.  WOC team will perform the consult tomorrow as requested.  Thank-you,  Stephane Fought MSN, RN, CWOCN, [place], CNS [phone]

## 2023-08-31 ENCOUNTER — Other Ambulatory Visit: Payer: Self-pay

## 2023-08-31 DIAGNOSIS — N186 End stage renal disease: Secondary | ICD-10-CM | POA: Diagnosis not present

## 2023-08-31 DIAGNOSIS — R202 Paresthesia of skin: Secondary | ICD-10-CM

## 2023-08-31 DIAGNOSIS — Z992 Dependence on renal dialysis: Secondary | ICD-10-CM | POA: Diagnosis not present

## 2023-08-31 LAB — CBC
HCT: 31.2 % — ABNORMAL LOW (ref 39.0–52.0)
Hemoglobin: 10.3 g/dL — ABNORMAL LOW (ref 13.0–17.0)
MCH: 33.2 pg (ref 26.0–34.0)
MCHC: 33 g/dL (ref 30.0–36.0)
MCV: 100.6 fL — ABNORMAL HIGH (ref 80.0–100.0)
Platelets: 242 10*3/uL (ref 150–400)
RBC: 3.1 MIL/uL — ABNORMAL LOW (ref 4.22–5.81)
RDW: 16.3 % — ABNORMAL HIGH (ref 11.5–15.5)
WBC: 8.5 10*3/uL (ref 4.0–10.5)
nRBC: 0 % (ref 0.0–0.2)

## 2023-08-31 LAB — HEPATITIS B SURFACE ANTIBODY, QUANTITATIVE: Hep B S AB Quant (Post): 87.4 m[IU]/mL

## 2023-08-31 LAB — GLUCOSE, CAPILLARY
Glucose-Capillary: 109 mg/dL — ABNORMAL HIGH (ref 70–99)
Glucose-Capillary: 137 mg/dL — ABNORMAL HIGH (ref 70–99)
Glucose-Capillary: 118 mg/dL — ABNORMAL HIGH (ref 70–99)
Glucose-Capillary: 130 mg/dL — ABNORMAL HIGH (ref 70–99)

## 2023-08-31 LAB — IRON AND TIBC
Iron: 58 ug/dL (ref 45–182)
Saturation Ratios: 16 % — ABNORMAL LOW (ref 17.9–39.5)
TIBC: 364 ug/dL (ref 250–450)
UIBC: 306 ug/dL

## 2023-08-31 LAB — BASIC METABOLIC PANEL
Anion gap: 20 — ABNORMAL HIGH (ref 5–15)
BUN: 55 mg/dL — ABNORMAL HIGH (ref 6–20)
CO2: 20 mmol/L — ABNORMAL LOW (ref 22–32)
Calcium: 9.6 mg/dL (ref 8.9–10.3)
Chloride: 93 mmol/L — ABNORMAL LOW (ref 98–111)
Creatinine, Ser: 10.84 mg/dL — ABNORMAL HIGH (ref 0.61–1.24)
GFR, Estimated: 6 mL/min — ABNORMAL LOW (ref >60)
Glucose, Bld: 122 mg/dL — ABNORMAL HIGH (ref 70–99)
Potassium: 4.2 mmol/L (ref 3.5–5.1)
Sodium: 133 mmol/L — ABNORMAL LOW (ref 135–145)

## 2023-08-31 LAB — FERRITIN: Ferritin: 241 ng/mL (ref 24–336)

## 2023-08-31 LAB — FOLATE: Folate: 7.9 ng/mL (ref >5.9)

## 2023-08-31 LAB — VITAMIN B12: Vitamin B-12: 815 pg/mL (ref 180–914)

## 2023-08-31 MED ORDER — CINACALCET HCL 30 MG PO TABS: 30.0000 mg | ORAL_TABLET | ORAL | Status: DC

## 2023-08-31 MED ORDER — CHLORHEXIDINE GLUCONATE CLOTH 2 % EX PADS
6.0000 | MEDICATED_PAD | Freq: Every day | CUTANEOUS | Status: DC
  Administered 2023-09-01: 6 via TOPICAL

## 2023-08-31 MED ORDER — SEVELAMER CARBONATE 800 MG PO TABS
1600.0000 mg | ORAL_TABLET | Freq: Three times a day (TID) | ORAL | Status: DC
  Administered 2023-08-31 – 2023-09-06 (×16): 1600 mg via ORAL
  Filled 2023-08-31 (×16): qty 2

## 2023-08-31 NOTE — Plan of Care (Signed)
   Problem: Education: Goal: Ability to describe self-care measures that may prevent or decrease complications (Diabetes Survival Skills Education) will improve Outcome: Progressing   Problem: Coping: Goal: Ability to adjust to condition or change in health will improve Outcome: Progressing   Problem: Fluid Volume: Goal: Ability to maintain a balanced intake and output will improve Outcome: Progressing

## 2023-08-31 NOTE — Progress Notes (Signed)
 HD#1 SUBJECTIVE:  Patient Summary: Phillip Young is a 38 y.o. with a pertinent PMH of ESRD, HFrEF, Afib on eliquis , chronic respiratory failure, and sacral decubitus ulcer coming in after missing a couple of dialysis sessions secondary to transportation issues admitted for ESRD needing dyalisis.   Interim History: Has aid at home but not able to help him even with his transportation company.   OBJECTIVE:  Vital Signs: Vitals:   08/31/23 0500 08/31/23 0621 08/31/23 0807 08/31/23 1106  BP: 121/81     Pulse: (!) 113   (!) 107  Resp:  20    Temp: 97.9 F (36.6 C)     TempSrc: Oral     SpO2:   92% 100%  Weight:      Height:       Supplemental O2: Nasal Cannula SpO2: 100 % O2 Flow Rate (L/min): 5 L/min  Filed Weights   08/30/23 0103 08/30/23 1415 08/30/23 1840  Weight: (!) 167.8 kg (!) 167.8 kg (!) 161.8 kg     Intake/Output Summary (Last 24 hours) at 08/31/2023 1400 Last data filed at 08/31/2023 1101 Gross per 24 hour  Intake 960 ml  Output 6000 ml  Net -5040 ml   Net IO Since Admission: -5,040 mL [08/31/23 1400]  Physical Exam: Physical Exam Cardiovascular:     Rate and Rhythm: Normal rate. Rhythm irregular.  Pulmonary:     Effort: Pulmonary effort is normal. No tachypnea, accessory muscle usage or respiratory distress.  Abdominal:     General: Abdomen is protuberant.     Palpations: Abdomen is soft. There is no fluid wave.     Tenderness: There is no abdominal tenderness. There is no guarding.  Musculoskeletal:     Right lower leg: Edema present.     Left lower leg: Edema present.  Neurological:     Mental Status: He is oriented to person, place, and time.    Patient Lines/Drains/Airways Status     Active Line/Drains/Airways     Name Placement date Placement time Site Days   Peripheral IV 08/30/23 20 G 1.88 Anterior;Proximal;Right Forearm 08/30/23  0241  Forearm  less than 1   Hemodialysis Catheter Right Internal jugular Double lumen Permanent (Tunneled)  05/30/23  0856  Internal jugular  92   Pressure Injury 07/05/23 Buttocks Right Stage 3 -  Full thickness tissue loss. Subcutaneous fat may be visible but bone, tendon or muscle are NOT exposed. 07/05/23  --  -- 56   Wound / Incision (Open or Dehisced) 07/02/23 Non-pressure wound Buttocks Right Stage 2 Decubitus Ulcer near coccyx, Rt of midline 07/02/23  1602  Buttocks  59             ASSESSMENT/PLAN:  Assessment: Principal Problem:   ESRD (end stage renal disease) on dialysis ([name]) Active Problems:   SOB (shortness of breath)   Encounter for screening involving social determinants of health (SDoH)   Plan:   SDOH affecting healthcare  TOC working on transportation. Appreciate TOC and rest of the team working on getting this gentleman aid with getting to HD.  -cw PT  -Consider GLP-1 or weight loss clinic at discharge to aid with weight loss.   ESRD on HD MWF Chronic Hypotension  Appreciate Nephrology. Sp 6L yesterday.  -HD and meds today per nephrology -Monitor electrolytes -cw Midodrine  30mg  TID  -Vitamin D  supplementation    AoCHRF HFrEF [EF 30-35%]  Last EF 30-35% in October 2024 with moderately decreased LV function and global hypokinsas. GDMT limited due  to hypotension. BNP elevated to 1162.6, per evaluation, has a bilateral LE swelling, otherwise no obvious JVD. Volume status exam is limited to large body habitus -managed with HD.  -cw Digoxin   Anemia  ESRD  IDA Ferritin is 241, Sat ratio 16%. Hgb is 10.3 today. Nephro working on getting records to see if he will need iron now as well as EPO.    Sacral Decubitus Ulcer  -Stage III, healing Frequent repositioning, foam dressing, RD consult.   Friction injury on left posterior thigh  Per wound care: Topical treatment orders provided for bedside nurses to perform as follows to protect from further injury: Foam dressing to left posterior thigh, change Q 3 days or PRN soiling.    Paroxysmal Afib EKG showed Afib  with tachycardia. Will continue home medication  -cw Eliquis  5 mg BID and Digoxin  0.125 mg TID.   Chronic pain/neuropathy Hyperanalgesic on exam. Severely tender.  -cw venlafaxine  37.5mg  daily  -cw lyrica  50mg  daily  -cw dilaudid  4mg  q6hrs PRN   Hypothyroidism: cw synthroid  25 mcg daily   Best Practice: Diet: Renal diet VTE: Apixaban   Signature: Altie Savard Alexander-Savino,MD  Internal Medicine Resident, PGY-1 [facility]  Pager: [phone] 2:00 PM, 08/31/2023   Please contact the on call pager after 5 pm and on weekends at [phone].

## 2023-08-31 NOTE — Progress Notes (Signed)
 Pt. HR rate increases to 130s  and up. IM notified.Will continue to monitor.

## 2023-08-31 NOTE — Plan of Care (Signed)
  Problem: Education: Goal: Knowledge of General Education information will improve Description: Including pain rating scale, medication(s)/side effects and non-pharmacologic comfort measures Outcome: Progressing   Problem: Health Behavior/Discharge Planning: Goal: Ability to manage health-related needs will improve Outcome: Progressing   Problem: Clinical Measurements: Goal: Ability to maintain clinical measurements within normal limits will improve Outcome: Progressing Goal: Will remain free from infection Outcome: Progressing Goal: Diagnostic test results will improve Outcome: Progressing Goal: Cardiovascular complication will be avoided Outcome: Progressing   Problem: Nutrition: Goal: Adequate nutrition will be maintained Outcome: Progressing   Problem: Coping: Goal: Level of anxiety will decrease Outcome: Progressing   Problem: Elimination: Goal: Will not experience complications related to bowel motility Outcome: Progressing Goal: Will not experience complications related to urinary retention Outcome: Progressing   Problem: Pain Managment: Goal: General experience of comfort will improve and/or be controlled Outcome: Progressing   Problem: Safety: Goal: Ability to remain free from injury will improve Outcome: Progressing   Problem: Skin Integrity: Goal: Risk for impaired skin integrity will decrease Outcome: Progressing   Problem: Clinical Measurements: Goal: Respiratory complications will improve Outcome: Not Progressing, needs compliance on fluid restriction, will not allow weaning of oxygen . Says needs 5 liters   Problem: Activity: Goal: Risk for activity intolerance will decrease Outcome: Not Progressing, offered multiple times but wants to wait to stand until edema decreased in legs

## 2023-08-31 NOTE — Progress Notes (Addendum)
Pt. Has an order for continues ,however pt. Refuses to comply by removing the prong.

## 2023-08-31 NOTE — Consult Note (Signed)
 WOC Nurse Consult Note: Reason for Consult: Consult requested for sacrum.  Pt is familiar to [facility] team from a recent visit on 12/10 when he had a Stage 3 pressure injury.  This is now pink dry healed scar tissue, no further open wound to sacrum.  Pt requested an assessment of left posterior thigh.  Patchy areas of pink moist partial thickness skin loss and clear fluid filled blisters.  Affected area is approx 4X4X.1cm.  Appearance is consistent with friction/shear, not pressure.   Dressing procedure/placement/frequency: Topical treatment orders provided for bedside nurses to perform as follows to protect from further injury: Foam dressing to left posterior thigh, change Q 3 days or PRN soiling. Please re-consult if further assistance is needed.  Thank-you,  Stephane Fought MSN, RN, CWOCN, [place], CNS [phone]

## 2023-08-31 NOTE — Progress Notes (Signed)
 [facility] Progress Note   Subjective:    Seen and examined patient at bedside. Tolerated yesterday's HD with net UF 6L. SW also at bedside discussing options on outpatient transportation. Next HD 09/01/23.  Objective Vitals:   08/31/23 0500 08/31/23 0621 08/31/23 0807 08/31/23 1106  BP: 121/81     Pulse: (!) 113   (!) 107  Resp:  20    Temp: 97.9 F (36.6 C)     TempSrc: Oral     SpO2:   92% 100%  Weight:      Height:       Physical Exam General: Awake, alert, obese, on O2, NAD Heart: RRR; No rubs or gallops Lungs: Clear throughout. Breathing unlabored Abdomen: Large, soft, and non-tender Extremities:2+ diffuse bilateral LE edema Dialysis Access: [facility]   Filed Weights   08/30/23 0103 08/30/23 1415 08/30/23 1840  Weight: (!) 167.8 kg (!) 167.8 kg (!) 161.8 kg    Intake/Output Summary (Last 24 hours) at 08/31/2023 1441 Last data filed at 08/31/2023 1101 Gross per 24 hour  Intake 960 ml  Output 6000 ml  Net -5040 ml    Additional Objective Labs: Basic Metabolic Panel: Recent Labs  Lab 08/30/23 0250 08/30/23 0257 08/30/23 0301 08/30/23 0540 08/31/23 0348  NA 134* 131* 129* 133* 133*  K 4.6 4.5 6.5* 4.6 4.2  CL 95* 98  --  92* 93*  CO2 17*  --   --  17* 20*  GLUCOSE 104* 102*  --  91 122*  BUN 88* 91*  --  87* 55*  CREATININE 15.05* 16.00*  --  15.15* 10.84*  CALCIUM  9.7  --   --  9.5 9.6  PHOS  --   --   --  9.3*  --    Liver Function Tests: Recent Labs  Lab 08/30/23 0250 08/30/23 0540  AST 22  --   ALT 26  --   ALKPHOS 141*  --   BILITOT 1.3*  --   PROT 7.1  --   ALBUMIN  3.5 3.4*   No results for input(s): LIPASE, AMYLASE in the last 168 hours. CBC: Recent Labs  Lab 08/30/23 0250 08/30/23 0257 08/30/23 0301 08/31/23 0348  WBC 7.8  --   --  8.5  NEUTROABS 6.0  --   --   --   HGB 9.8* 11.2* 10.2* 10.3*  HCT 30.5* 33.0* 30.0* 31.2*  MCV 102.3*  --   --  100.6*  PLT 241  --   --  242   Blood Culture    Component  Value Date/Time   SDES BLOOD BLOOD RIGHT HAND 05/27/2023 0959   SPECREQUEST  05/27/2023 0959    BOTTLES DRAWN AEROBIC AND ANAEROBIC Blood Culture adequate volume   CULT  05/27/2023 0959    NO GROWTH 5 DAYS Performed at [facility] Lab, [address] [place], [place] [address]    REPTSTATUS 06/01/2023 FINAL 05/27/2023 0959    Cardiac Enzymes: No results for input(s): CKTOTAL, CKMB, CKMBINDEX, TROPONINI in the last 168 hours. CBG: Recent Labs  Lab 08/30/23 0829 08/30/23 2133 08/31/23 0731 08/31/23 1123  GLUCAP 97 128* 109* 137*   Iron Studies:  Recent Labs    08/31/23 0348  IRON 58  TIBC 364  FERRITIN 241   Lab Results  Component Value Date   INR 2.0 (H) 06/10/2022   INR 2.8 (H) 06/09/2022   INR 2.9 (H) 06/07/2022   Studies/Results: DG Chest Portable 1 View Result Date: 08/30/2023 CLINICAL DATA:  Shortness of breath EXAM: PORTABLE CHEST 1 VIEW COMPARISON:  07/01/2023 FINDINGS: Right central venous catheter with tip over the mid SVC region. No pneumothorax. Cardiac enlargement. No vascular congestion, edema, or consolidation. No pleural effusion or pneumothorax. Mediastinal contours appear intact. IMPRESSION: Prominent cardiac enlargement similar to prior study. Lungs are clear. Electronically Signed   By: Elsie Gravely M.D.   On: 08/30/2023 01:43    Medications:   apixaban   5 mg Oral BID   Chlorhexidine  Gluconate Cloth  6 each Topical Q0600   cholecalciferol   400 Units Oral Daily   [START ON 09/03/2023] Darbepoetin Alfa   150 mcg Subcutaneous Q Sat-1800   digoxin   0.125 mg Oral Once per day on Monday Wednesday Friday   insulin  aspart  0-5 Units Subcutaneous QHS   insulin  aspart  0-6 Units Subcutaneous TID WC   levothyroxine   25 mcg Oral Q0600   midodrine   30 mg Oral TID WC   pregabalin   50 mg Oral Daily   sevelamer  carbonate  1,600 mg Oral TID WC   venlafaxine  XR  37.5 mg Oral Daily    Dialysis Orders: [facility] MWF From dec 2024 -->  4.5h   2/2 bath   155.5kg  RIJ TDC  Heparin  none Micera 200mcg every 2 weeks - last dose 08/24/23 Venofer 100mg  load - last dose 08/24/23. Last dose scheduled for 09/02/23  Renal-related home meds: - sensipar  30 every day - fosrenol  1 gm ac tid - midodrine  30 tid - others: effexor  xr, lyrica , T4, digoxin , eliquis   Assessment/Plan: Acute on chronic hypoxic and hypercarbic resp failure - likely due to vol overload from missed HD. Is on O2 2-3L at home.  ESRD - on HD MWF. Last HD was Wed 1/29. Received HD 2/4 and 6L removed. Next HD 2/6. UFG set 5-6L. K+ now stable. Chronic hypotension - on high dose midodrine  30 tid, continue.  Volume - as above has sig LE edema from missed HD.  Anemia of esrd - Hb 9-10s here. ESA last given on 1/29, not due yet. Secondary hyperparathyroidism - CCa in range, phos is high. Cont sensipar . He reports not tolerating Fosrenol  (medication makes him sick). Will use Renvela  while here. HFrEDF - low EF of 30-35% last check in oct 2024 Atrial fib - on digoxin , eliquis  Chronic resp failure - felt to be due to amio lung toxicity.  Charmaine Piety, NP [facility] 08/31/2023,2:41 PM  LOS: 1 day

## 2023-09-01 ENCOUNTER — Encounter: Payer: Medicare Other | Admitting: Neurology

## 2023-09-01 DIAGNOSIS — N186 End stage renal disease: Secondary | ICD-10-CM | POA: Diagnosis not present

## 2023-09-01 DIAGNOSIS — Z992 Dependence on renal dialysis: Secondary | ICD-10-CM | POA: Diagnosis not present

## 2023-09-01 LAB — GLUCOSE, CAPILLARY
Glucose-Capillary: 101 mg/dL — ABNORMAL HIGH (ref 70–99)
Glucose-Capillary: 102 mg/dL — ABNORMAL HIGH (ref 70–99)
Glucose-Capillary: 107 mg/dL — ABNORMAL HIGH (ref 70–99)
Glucose-Capillary: 106 mg/dL — ABNORMAL HIGH (ref 70–99)

## 2023-09-01 LAB — CBC
HCT: 29.7 % — ABNORMAL LOW (ref 39.0–52.0)
Hemoglobin: 9.9 g/dL — ABNORMAL LOW (ref 13.0–17.0)
MCH: 33.7 pg (ref 26.0–34.0)
MCHC: 33.3 g/dL (ref 30.0–36.0)
MCV: 101 fL — ABNORMAL HIGH (ref 80.0–100.0)
Platelets: 222 10*3/uL (ref 150–400)
RBC: 2.94 MIL/uL — ABNORMAL LOW (ref 4.22–5.81)
RDW: 16.3 % — ABNORMAL HIGH (ref 11.5–15.5)
WBC: 6.8 10*3/uL (ref 4.0–10.5)
nRBC: 0 % (ref 0.0–0.2)

## 2023-09-01 LAB — MAGNESIUM: Magnesium: 2.2 mg/dL (ref 1.7–2.4)

## 2023-09-01 LAB — RENAL FUNCTION PANEL
Albumin: 3.4 g/dL — ABNORMAL LOW (ref 3.5–5.0)
Anion gap: 20 — ABNORMAL HIGH (ref 5–15)
BUN: 69 mg/dL — ABNORMAL HIGH (ref 6–20)
CO2: 20 mmol/L — ABNORMAL LOW (ref 22–32)
Calcium: 9.2 mg/dL (ref 8.9–10.3)
Chloride: 94 mmol/L — ABNORMAL LOW (ref 98–111)
Creatinine, Ser: 12.06 mg/dL — ABNORMAL HIGH (ref 0.61–1.24)
GFR, Estimated: 5 mL/min — ABNORMAL LOW (ref >60)
Glucose, Bld: 100 mg/dL — ABNORMAL HIGH (ref 70–99)
Phosphorus: 7.8 mg/dL — ABNORMAL HIGH (ref 2.5–4.6)
Potassium: 4.5 mmol/L (ref 3.5–5.1)
Sodium: 134 mmol/L — ABNORMAL LOW (ref 135–145)

## 2023-09-01 MED ORDER — ALTEPLASE 2 MG IJ SOLR: 2.0000 mg | Freq: Once | INTRAMUSCULAR | Status: DC | PRN

## 2023-09-01 MED ORDER — LIDOCAINE-PRILOCAINE 2.5-2.5 % EX CREA: 1.0000 | TOPICAL_CREAM | CUTANEOUS | Status: DC | PRN

## 2023-09-01 MED ORDER — ANTICOAGULANT SODIUM CITRATE 4% (200MG/5ML) IV SOLN
5.0000 mL | Freq: Once | Status: AC
  Administered 2023-09-01: 5 mL
  Filled 2023-09-01 (×2): qty 5

## 2023-09-01 MED ORDER — ACETAMINOPHEN 500 MG PO TABS: 1000.0000 mg | ORAL_TABLET | Freq: Four times a day (QID) | ORAL | Status: DC | PRN

## 2023-09-01 MED ORDER — LIDOCAINE 5 % EX PTCH
1.0000 | MEDICATED_PATCH | CUTANEOUS | Status: DC
  Filled 2023-09-01: qty 1

## 2023-09-01 MED ORDER — PENTAFLUOROPROP-TETRAFLUOROETH EX AERO: 1.0000 | INHALATION_SPRAY | CUTANEOUS | Status: DC | PRN

## 2023-09-01 MED ORDER — TRIAMCINOLONE ACETONIDE 0.1 % EX OINT
TOPICAL_OINTMENT | Freq: Two times a day (BID) | CUTANEOUS | Status: DC
  Administered 2023-09-05: 1 via TOPICAL
  Filled 2023-09-01: qty 15

## 2023-09-01 MED ORDER — LIDOCAINE HCL (PF) 1 % IJ SOLN: 5.0000 mL | INTRAMUSCULAR | Status: DC | PRN

## 2023-09-01 NOTE — Progress Notes (Signed)
 Initial Nutrition Assessment  DOCUMENTATION CODES:   Morbid obesity  INTERVENTION:   Double portion proteins  NUTRITION DIAGNOSIS:  Increased nutrient needs related to chronic illness as evidenced by estimated needs.  GOAL:  Patient will meet greater than or equal to 90% of their needs  MONITOR:  PO intake  REASON FOR ASSESSMENT:  Consult Assessment of nutrition requirement/status  ASSESSMENT:   Phillip Young is 38 y.o male admitted for HD after missing multiple sessions d/t transportation barrier. PMH: ESRD-HD, Afib, chronic respiratory failure, sacral decubitus ulcer.  He does have a home aid that helps with ADLs, but does not provide transportation. He resides with his mother, who does most of the cooking and grocery shopping. Endorses adequate appetite PTA. Consuming 100% x2 documented meals. Some difficulty breathing 2/2 volume overload. No issues with chewing or swallowing reported. Encouraged high protein intake as he has consumed more salads in recent days. He is amicable to 2x protein portions.   24 Hour Recall B: bagel and cream cheese w/ H20 (flavored) L:sandwich w/ potato chips D: sandwich or baked chicken, vegetable, and a starch Snack: applesauce, mandarin oranges, pudding  Also discussed the importance of attending dialysis txs so that he can manage his fluid/electrolyte status,  BMM disease process, and his anemia. He verbalized understanding. TOC also working to overcome transportation barrier.   Attempting 6L UF on HD today. Achieved this pull on last tx as well on 2/4. BLEs with significant edema. Does endorse some GI upset 2/2 phosphorus binder (Fosrenol ). On Renvela  while inpatient. Endorses hyperphosphatemia at baseline w/ associated pruritus.   Admit Weight: 167.8kg Current Weight: 161.8kg EDW: 155.5kg   Net IO Since Admission: -4,680 mL [09/01/23 1143]   Appears to be approaching his target weight, however will significant edema still noted. Hyperkalemia and  hyponatremia being corrected with volume removal.  Meds: cholecalciferol , cinacalcet , darbepoetin alfa , SSI, levothyroxine , sevelamer  carbonate  Labs: Na+ 129>133>134 (L) K+ 6.5--->4.5 (wdl) CBGs 100-122 over 48 hours A1c 4.6 (05/2023)  NUTRITION - FOCUSED PHYSICAL EXAM: Flowsheet Row Most Recent Value  Orbital Region No depletion  Upper Arm Region No depletion  Thoracic and Lumbar Region No depletion  Buccal Region No depletion  Temple Region No depletion  Clavicle Bone Region No depletion  Clavicle and Acromion Bone Region No depletion  Scapular Bone Region No depletion  Patellar Region Unable to assess  [pitting edema]  Anterior Thigh Region Unable to assess  [pitting edema]  Posterior Calf Region Unable to assess  [pitting edema]  Edema (RD Assessment) Moderate  Hair Reviewed  Eyes Reviewed  Mouth Reviewed  Skin Reviewed  Nails Reviewed     Diet Order:   Diet Order             Diet renal with fluid restriction Fluid restriction: 1200 mL Fluid; Room service appropriate? Yes; Fluid consistency: Thin  Diet effective now             EDUCATION NEEDS:   Education needs have been addressed  Skin:  Skin Assessment: Skin Integrity Issues: Skin Integrity Issues:: Incisions Incisions: L thigh - partial thickness blisters  Last BM:  2/5 - type 5  Height:  Ht Readings from Last 1 Encounters:  08/30/23 6' (1.829 m)    Weight:  Wt Readings from Last 1 Encounters:  09/01/23 (!) 163.6 kg    Ideal Body Weight:  80.9 kg  BMI:  Body mass index is 48.92 kg/m.  Estimated Nutritional Needs:   Kcal:  2100-2300kical  Protein:  95-105g  Fluid:  1L + UOP  Blair Deaner MS, RD, LDN Registered Dietitian Clinical Nutrition RD Inpatient Contact Info in Amion

## 2023-09-01 NOTE — TOC Progression Note (Signed)
 Transition of Care [facility]) - Progression Note    Patient Details  Name: Phillip Young MRN: [id number] Date of Birth: 06-Feb-1986  Transition of [facility] CM/SW Contact  Tom-Johnson, Harvest Muskrat, RN Phone Number: 09/01/2023, 11:12 AM  Clinical Narrative:     CM called [facility] [phone]) and spoke with Kristeen about assisting patient with transportation to and from Dialysis. (Reference call # [id number]). Ciera stats patient does not have a [employer] Plan but has a Standard Medicaid Plan therefore, patient should go through DSS to schedule his transportation.  CM called Medicaid transportation [phone]) and was told patient to schedule with [facility]. Patient declined stating they do not do door to door transportation.  CM spoke with patient's mother, Phillip Young [phone]). Phillip Young states she is going to see if patient's Insurance will be changed back to his previous Insurance so he will be able to schedule with [facility].  Phillip Young to call CM with update.   CM will continue to follow.     Expected Discharge Plan and Services                                               Social Determinants of Health (SDOH) Interventions SDOH Screenings   Food Insecurity: No Food Insecurity (08/30/2023)  Housing: Low Risk  (08/30/2023)  Transportation Needs: Unmet Transportation Needs (08/30/2023)  Utilities: Not At Risk (08/30/2023)  Alcohol  Screen: Low Risk  (09/21/2022)  Depression (PHQ2-9): High Risk (06/21/2023)  Financial Resource Strain: Low Risk  (03/24/2023)   Received from [facility]  Physical Activity: Inactive (09/21/2022)  Social Connections: Moderately Isolated (03/24/2023)   Received from [facility]  Stress: No Stress Concern Present (04/21/2023)   Received from [facility]  Tobacco Use: Medium Risk (08/30/2023)    Readmission Risk Interventions    07/05/2023    4:25 PM 05/24/2023    4:43 PM 03/23/2023    3:48 PM  Readmission Risk  Prevention Plan  Transportation Screening Complete Complete Complete  PCP or Specialist Appt within 5-7 Days  Complete   Home Care Screening  Complete   Medication Review (RN CM)  Referral to Pharmacy   HRI or Home Care Consult Complete    Social Work Consult for Recovery Care Planning/Counseling Complete    Palliative Care Screening Not Applicable    Medication Review [employer]) Complete  Complete  PCP or Specialist appointment within 3-5 days of discharge   --  HRI or Home Care Consult   Complete  SW Recovery Care/Counseling Consult   Complete  Palliative Care Screening   Not Applicable  Skilled Nursing Facility   Not Applicable

## 2023-09-01 NOTE — Progress Notes (Addendum)
 [facility] Progress Note   Subjective:    Seen and examined patient on HD. Tolerating UFG 6L and on high-dose Midodrine . Denies any acute complaints.  Objective Vitals:   09/01/23 0752 09/01/23 0822 09/01/23 0830 09/01/23 0900  BP: 94/76  105/85 (!) 118/106  Pulse: (!) 116     Resp:      Temp: 98.4 F (36.9 C) 98.3 F (36.8 C)    TempSrc: Oral Oral    SpO2: 100%     Weight:      Height:       Physical Exam General: Awake, alert, obese, on 5L O2 via [place], NAD Heart: RRR; No rubs or gallops Lungs: Clear anteriorly. Breathing unlabored Abdomen: Large, soft, and non-tender Extremities: 2+ diffuse bilateral LE edema Dialysis Access: R[facility]   Filed Weights   08/30/23 0103 08/30/23 1415 08/30/23 1840  Weight: (!) 167.8 kg (!) 167.8 kg (!) 161.8 kg    Intake/Output Summary (Last 24 hours) at 09/01/2023 0915 Last data filed at 08/31/2023 1702 Gross per 24 hour  Intake 480 ml  Output --  Net 480 ml    Additional Objective Labs: Basic Metabolic Panel: Recent Labs  Lab 08/30/23 0540 08/31/23 0348 09/01/23 0343  NA 133* 133* 134*  K 4.6 4.2 4.5  CL 92* 93* 94*  CO2 17* 20* 20*  GLUCOSE 91 122* 100*  BUN 87* 55* 69*  CREATININE 15.15* 10.84* 12.06*  CALCIUM  9.5 9.6 9.2  PHOS 9.3*  --  7.8*   Liver Function Tests: Recent Labs  Lab 08/30/23 0250 08/30/23 0540 09/01/23 0343  AST 22  --   --   ALT 26  --   --   ALKPHOS 141*  --   --   BILITOT 1.3*  --   --   PROT 7.1  --   --   ALBUMIN  3.5 3.4* 3.4*   No results for input(s): LIPASE, AMYLASE in the last 168 hours. CBC: Recent Labs  Lab 08/30/23 0250 08/30/23 0257 08/30/23 0301 08/31/23 0348 09/01/23 0343  WBC 7.8  --   --  8.5 6.8  NEUTROABS 6.0  --   --   --   --   HGB 9.8*   < > 10.2* 10.3* 9.9*  HCT 30.5*   < > 30.0* 31.2* 29.7*  MCV 102.3*  --   --  100.6* 101.0*  PLT 241  --   --  242 222   < > = values in this interval not displayed.   Blood Culture    Component Value  Date/Time   SDES BLOOD BLOOD RIGHT HAND 05/27/2023 0959   SPECREQUEST  05/27/2023 0959    BOTTLES DRAWN AEROBIC AND ANAEROBIC Blood Culture adequate volume   CULT  05/27/2023 0959    NO GROWTH 5 DAYS Performed at [facility], [address] [place], [place] [address]    REPTSTATUS 06/01/2023 FINAL 05/27/2023 0959    Cardiac Enzymes: No results for input(s): CKTOTAL, CKMB, CKMBINDEX, TROPONINI in the last 168 hours. CBG: Recent Labs  Lab 08/31/23 0731 08/31/23 1123 08/31/23 1655 08/31/23 2058 09/01/23 0728  GLUCAP 109* 137* 118* 130* 101*   Iron Studies:  Recent Labs    08/31/23 0348  IRON 58  TIBC 364  FERRITIN 241   Lab Results  Component Value Date   INR 2.0 (H) 06/10/2022   INR 2.8 (H) 06/09/2022   INR 2.9 (H) 06/07/2022   Studies/Results: No results found.  Medications:  anticoagulant sodium  citrate      apixaban   5 mg Oral BID   Chlorhexidine  Gluconate Cloth  6 each Topical Q0600   cholecalciferol   400 Units Oral Daily   [START ON 09/02/2023] cinacalcet   30 mg Oral Q M,W,F-HD   [START ON 09/03/2023] Darbepoetin Alfa   150 mcg Subcutaneous Q Sat-1800   digoxin   0.125 mg Oral Once per day on Monday Wednesday Friday   insulin  aspart  0-5 Units Subcutaneous QHS   insulin  aspart  0-6 Units Subcutaneous TID WC   levothyroxine   25 mcg Oral Q0600   midodrine   30 mg Oral TID WC   pregabalin   50 mg Oral Daily   sevelamer  carbonate  1,600 mg Oral TID WC   venlafaxine  XR  37.5 mg Oral Daily    Dialysis Orders: [facility] MWF From dec 2024 -->  4.5h   2/2 bath  155.5kg  RIJ TDC  Heparin  none Micera 200mcg every 2 weeks - last dose 08/24/23 Venofer 100mg  load - last dose 08/24/23. Last dose scheduled for 09/02/23   Renal-related home meds: - sensipar  30 every day - fosrenol  1 gm ac tid - midodrine  30 tid - others: effexor  xr, lyrica , T4, digoxin , eliquis   Assessment/Plan: Acute on chronic hypoxic and hypercarbic resp failure - likely due to vol overload  from missed HD. Is on O2 2-3L at home.  ESRD - on HD MWF. Last HD was Wed 1/29. Received HD 2/4 and 6L removed. On HD and UFG set 5-6L. Plan to transition back to MWF schedule next week. K+ now stable.  Chronic hypotension - on high dose midodrine  30 tid, continue.  Volume - as above has sig LE edema from missed HD.  Anemia of esrd - Hb 9-10s here. ESA last given on 1/29, not due yet. Secondary hyperparathyroidism - CCa in range, phos is high. Cont sensipar . He reports not tolerating Fosrenol  (medication makes him sick) so now on Renvela  while here. HFrEDF - low EF of 30-35% last check in oct 2024 Atrial fib - on digoxin , eliquis  Chronic resp failure - felt to be due to amio lung toxicity. Dispo - TOC working on transportation issues. We appreciate their assistance.  Charmaine Piety, NP [facility] 09/01/2023,9:15 AM  LOS: 2 days

## 2023-09-01 NOTE — Progress Notes (Signed)
 HD#2 SUBJECTIVE:  Patient Summary: Phillip Young is a 38 y.o. with a pertinent PMH of ESRD, HFrEF, Afib on eliquis , chronic respiratory failure, and sacral decubitus ulcer coming in after missing a couple of dialysis sessions secondary to transportation issues admitted for ESRD needing dyalisis.   Interim History: TOC assisting with his transportation, feels about the same as when he came in.   OBJECTIVE:  Vital Signs: Vitals:   09/01/23 1000 09/01/23 1030 09/01/23 1045 09/01/23 1100  BP: 134/71 95/66 100/88 114/74  Pulse:    (!) 152  Resp:    15  Temp:      TempSrc:      SpO2:    96%  Weight:      Height:       Supplemental O2: Nasal Cannula SpO2: 96 % O2 Flow Rate (L/min): 5 L/min  Filed Weights   08/30/23 1415 08/30/23 1840 09/01/23 0845  Weight: (!) 167.8 kg (!) 161.8 kg (!) 163.6 kg    Intake/Output Summary (Last 24 hours) at 09/01/2023 1138 Last data filed at 09/01/2023 1100 Gross per 24 hour  Intake 360 ml  Output --  Net 360 ml   Net IO Since Admission: -4,680 mL [09/01/23 1138]  Physical Exam: Physical Exam Cardiovascular:     Rate and Rhythm: Normal rate. Rhythm irregular.  Pulmonary:     Effort: Pulmonary effort is normal. No tachypnea, accessory muscle usage or respiratory distress.  Abdominal:     General: Abdomen is protuberant.     Palpations: Abdomen is soft. There is no fluid wave.     Tenderness: There is no abdominal tenderness. There is no guarding.  Musculoskeletal:     Right lower leg: Edema present.     Left lower leg: Edema present.  Skin:    Capillary Refill: Capillary refill takes more than 3 seconds.     Comments: Healing sacral decubitus ulcer, mainly with scar tissue and tunnel formation.  Deep nodules and tunnel formation over the left thigh and sacrum and buttocks. Some are healing on the left. No signs of erosions or irritation.   Neurological:     Mental Status: He is oriented to person, place, and time.    Patient  Lines/Drains/Airways Status     Active Line/Drains/Airways     Name Placement date Placement time Site Days   Peripheral IV 08/30/23 20 G 1.88 Anterior;Proximal;Right Forearm 08/30/23  0241  Forearm  less than 1   Hemodialysis Catheter Right Internal jugular Double lumen Permanent (Tunneled) 05/30/23  0856  Internal jugular  92   Pressure Injury 07/05/23 Buttocks Right Stage 3 -  Full thickness tissue loss. Subcutaneous fat may be visible but bone, tendon or muscle are NOT exposed. 07/05/23  --  -- 56   Wound / Incision (Open or Dehisced) 07/02/23 Non-pressure wound Buttocks Right Stage 2 Decubitus Ulcer near coccyx, Rt of midline 07/02/23  1602  Buttocks  59             ASSESSMENT/PLAN:  Assessment: Principal Problem:   ESRD (end stage renal disease) on dialysis ([name]) Active Problems:   SOB (shortness of breath)   Encounter for screening involving social determinants of health (SDoH)  Plan:  SDOH affecting healthcare  TOC working on transportation. Appreciate TOC and rest of the team working on getting this gentleman aid with getting to HD.  -cw PT  -Consider GLP-1 or weight loss clinic at discharge to aid with weight loss.   ESRD on HD MWF Chronic Hypotension  Appreciate Nephrology. Sp 6L yesterday.  -HD and meds today per nephrology -Monitor electrolytes -cw Midodrine  30mg  TID  -Vitamin D  supplementation    AoCHRF HFrEF [EF 30-35%]  Last EF 30-35% in October 2024 with moderately decreased LV function and global hypokinsas. GDMT limited due to hypotension. BNP elevated to 1162.6, per evaluation, has a bilateral LE swelling, otherwise no obvious JVD. Volume status exam is limited to large body habitus -managed with HD.  -cw Digoxin   Anemia  ESRD  IDA Ferritin is 241, Sat ratio 16%. Hgb is 10.3 today. Nephro working on getting records to see if he will need iron now as well as EPO.    Sacral Decubitus Ulcer  -Stage IV, healing and mostly scar tissue at this time.  Important to prevent re-injury with frequent repositioning.  -Foam dressing needs to be in place over sacral area.  -RD consult pending.   Hidradenitis Suppurativa Has several nodules, scar and tunnel formation on the left lateral thigh, buttocks, and bilateral axillae. This patient has not had treatment for his HS and is in significant pain for this. No tunnels actively or draining nodules at this time, the ones on his thighs and buttocks are very deep and painful. Too deep for IL steroid injections and too many to treat at this time. May benefit from outpatient treatment and referral to dermatology. Will continue to monitor his for pain. He is already on hydromorphone  for his lower extremity pain/neuropathy. Will add Triamcinolone  for his left thigh and tylenol  for pain.   -Tylenol  1000 mg q6hrs PRN  -Triamcinolone  ointment over left lateral thigh  -Cover with foam dressing.    Paroxysmal Afib EKG showed Afib with tachycardia. Will continue home medication  -cw Eliquis  5 mg BID and Digoxin  0.125 mg TID.   Chronic pain/neuropathy Hyperanalgesic on exam. Severely tender.  -cw venlafaxine  37.5mg  daily  -cw lyrica  50mg  daily  -cw dilaudid  4mg  q6hrs PRN   Hypothyroidism: cw synthroid  25 mcg daily   Best Practice: Diet: Renal diet VTE: Apixaban   Signature: Ezmae Speers Alexander-Savino,MD  Internal Medicine Resident, PGY-1 [facility]  Pager: [phone] 11:38 AM, 09/01/2023   Please contact the on call pager after 5 pm and on weekends at [phone].

## 2023-09-01 NOTE — Progress Notes (Signed)
 Pt received tray with double portion of protein at lunch.  Then he had a friend bring a footlong sub with doritoes as well as ordering a dinner tray. He is refusing CBG finger sticks before dinner.

## 2023-09-01 NOTE — Progress Notes (Addendum)
 Received patient in bed.Awake,alert and oriented x 4.Consent verified.  Access used : Right HD catheter that worked well.Dressing on date.  Duration of treatment 4 hours.  UF goal  :  Met 5.7 liters.  Tolerated treatment: Yes.                      Refused 30 mg of Midodrine    Back into his room with stable medical condition via transporter.

## 2023-09-02 ENCOUNTER — Telehealth (HOSPITAL_COMMUNITY): Payer: Self-pay | Admitting: *Deleted

## 2023-09-02 DIAGNOSIS — N186 End stage renal disease: Secondary | ICD-10-CM | POA: Diagnosis not present

## 2023-09-02 DIAGNOSIS — Z992 Dependence on renal dialysis: Secondary | ICD-10-CM | POA: Diagnosis not present

## 2023-09-02 LAB — CBC
HCT: 30.3 % — ABNORMAL LOW (ref 39.0–52.0)
Hemoglobin: 9.9 g/dL — ABNORMAL LOW (ref 13.0–17.0)
MCH: 33.4 pg (ref 26.0–34.0)
MCHC: 32.7 g/dL (ref 30.0–36.0)
MCV: 102.4 fL — ABNORMAL HIGH (ref 80.0–100.0)
Platelets: 206 10*3/uL (ref 150–400)
RBC: 2.96 MIL/uL — ABNORMAL LOW (ref 4.22–5.81)
RDW: 16.6 % — ABNORMAL HIGH (ref 11.5–15.5)
WBC: 7.9 10*3/uL (ref 4.0–10.5)
nRBC: 0 % (ref 0.0–0.2)

## 2023-09-02 LAB — RENAL FUNCTION PANEL
Albumin: 3.3 g/dL — ABNORMAL LOW (ref 3.5–5.0)
Anion gap: 17 — ABNORMAL HIGH (ref 5–15)
BUN: 47 mg/dL — ABNORMAL HIGH (ref 6–20)
CO2: 22 mmol/L (ref 22–32)
Calcium: 9.7 mg/dL (ref 8.9–10.3)
Chloride: 97 mmol/L — ABNORMAL LOW (ref 98–111)
Creatinine, Ser: 8.68 mg/dL — ABNORMAL HIGH (ref 0.61–1.24)
GFR, Estimated: 7 mL/min — ABNORMAL LOW (ref >60)
Glucose, Bld: 101 mg/dL — ABNORMAL HIGH (ref 70–99)
Phosphorus: 5.9 mg/dL — ABNORMAL HIGH (ref 2.5–4.6)
Potassium: 4.3 mmol/L (ref 3.5–5.1)
Sodium: 136 mmol/L (ref 135–145)

## 2023-09-02 LAB — GLUCOSE, CAPILLARY
Glucose-Capillary: 143 mg/dL — ABNORMAL HIGH (ref 70–99)
Glucose-Capillary: 98 mg/dL (ref 70–99)
Glucose-Capillary: 108 mg/dL — ABNORMAL HIGH (ref 70–99)
Glucose-Capillary: 101 mg/dL — ABNORMAL HIGH (ref 70–99)

## 2023-09-02 LAB — MAGNESIUM: Magnesium: 2.1 mg/dL (ref 1.7–2.4)

## 2023-09-02 MED ORDER — CHLORHEXIDINE GLUCONATE CLOTH 2 % EX PADS
6.0000 | MEDICATED_PAD | Freq: Every day | CUTANEOUS | Status: DC
  Administered 2023-09-03 – 2023-09-05 (×3): 6 via TOPICAL

## 2023-09-02 MED ORDER — CINACALCET HCL 30 MG PO TABS
30.0000 mg | ORAL_TABLET | ORAL | Status: DC
Start: 2023-09-03 — End: 2023-09-07
  Administered 2023-09-03: 30 mg via ORAL
  Filled 2023-09-02: qty 1

## 2023-09-02 NOTE — Plan of Care (Signed)

## 2023-09-02 NOTE — Telephone Encounter (Signed)
 Received forms from [employer] for pt's disability claim and request for records from sept 2024 to present.  Pt has not been seen in this clinic since 04/19/22 so unable to complete. Forms faxed back to [facility] with note pt not seen since Sept 2023 PCP needs to complete

## 2023-09-02 NOTE — Progress Notes (Signed)
 HD#3 SUBJECTIVE:  Patient Summary: Phillip Young is a 38 y.o. with a pertinent PMH of ESRD, HFrEF, Afib on eliquis , chronic respiratory failure, and sacral decubitus ulcer coming in after missing a couple of dialysis sessions secondary to transportation issues admitted for ESRD needing dyalisis.   Interim History: Feels okay today. Has had difficulty ambulating since intubation that was about 2 months long during the summer of 2024. Since he has had polyneuropathy and foot drop that have prevented him from walking much. He feels that the pain is not limiting his mobility it is just the weakness that he has.   OBJECTIVE:  Vital Signs: Vitals:   09/01/23 1348 09/01/23 2056 09/02/23 0500 09/02/23 0808  BP: 93/68 107/81 109/85 105/88  Pulse: 70 97 97 (!) 103  Resp:  18  20  Temp: 97.8 F (36.6 C) 97.9 F (36.6 C) 97.8 F (36.6 C) 98.2 F (36.8 C)  TempSrc: Oral   Oral  SpO2: 100% 100% 100% 100%  Weight:      Height:       Supplemental O2: Nasal Cannula SpO2: 100 % O2 Flow Rate (L/min): 6 L/min  Filed Weights   08/30/23 1840 09/01/23 0845 09/01/23 1255  Weight: (!) 161.8 kg (!) 163.6 kg (!) 157.9 kg    Intake/Output Summary (Last 24 hours) at 09/02/2023 1335 Last data filed at 09/01/2023 1832 Gross per 24 hour  Intake 480 ml  Output --  Net 480 ml   Net IO Since Admission: -9,420 mL [09/02/23 1335]  Physical Exam: Physical Exam Cardiovascular:     Rate and Rhythm: Normal rate. Rhythm irregular.  Pulmonary:     Effort: Pulmonary effort is normal. No tachypnea, accessory muscle usage or respiratory distress.     Breath sounds: No wheezing, rhonchi or rales.  Abdominal:     General: Abdomen is protuberant.     Palpations: Abdomen is soft. There is no fluid wave.     Tenderness: There is no abdominal tenderness. There is no guarding.  Musculoskeletal:     Right lower leg: Edema present.     Left lower leg: Edema present.  Skin:    Capillary Refill: Capillary refill  takes more than 3 seconds.     Comments: Healing sacral decubitus ulcer, mainly with scar tissue and tunnel formation.  Deep nodules and tunnel formation over the left thigh and sacrum and buttocks. Some are healing on the left. No signs of erosions or irritation.   Neurological:     Mental Status: He is oriented to person, place, and time.    Patient Lines/Drains/Airways Status     Active Line/Drains/Airways     Name Placement date Placement time Site Days   Peripheral IV 08/30/23 20 G 1.88 Anterior;Proximal;Right Forearm 08/30/23  0241  Forearm  less than 1   Hemodialysis Catheter Right Internal jugular Double lumen Permanent (Tunneled) 05/30/23  0856  Internal jugular  92   Pressure Injury 07/05/23 Buttocks Right Stage 3 -  Full thickness tissue loss. Subcutaneous fat may be visible but bone, tendon or muscle are NOT exposed. 07/05/23  --  -- 56   Wound / Incision (Open or Dehisced) 07/02/23 Non-pressure wound Buttocks Right Stage 2 Decubitus Ulcer near coccyx, Rt of midline 07/02/23  1602  Buttocks  59             ASSESSMENT/PLAN:  Assessment: Principal Problem:   ESRD (end stage renal disease) on dialysis ([name]) Active Problems:   SOB (shortness of breath)  Encounter for screening involving social determinants of health [facility])  Plan:  SDOH affecting healthcare  Debility Obesity   Main barrier for him is his inability to move and ambulate. He has had difficulty ambulating since prolonged intubation for PNA during the summer of 2024. Since he has had polyneuropathy and foot drop that have prevented him from walking much. He feels that the pain is not limiting his mobility it is just the weakness that he has. He has gone to rehab but not successful with this. TOC working on transportation set-up so that he can get dialysis. Next HD session tomorrow and then will need to transition to MWF schedule he is at home.    -PT  -Consider GLP-1 or weight loss clinic at discharge to  aid with weight loss.   ESRD on HD MWF Chronic Hypotension  Appreciate Nephrology. Sp 5L yesterday.  -HD tomorrow  -Monitor electrolytes -cw Midodrine  30mg  TID  -Vitamin D  supplementation    Chronic respiratory failure  Thought to be from Amiodarone  Toxicity   Is on O2 at home 2L. Used 5L yesterday. Afebrile. WBC WNL Lungs clear to auscultation. Will wean O2. Goal >90% O2 sat.    AoCHRF HFrEF [EF 30-35%]  Last EF 30-35% in October 2024 with moderately decreased LV function and global hypokinsas. GDMT limited due to hypotension. BNP elevated to 1162.6, per evaluation, has a bilateral LE swelling, otherwise no obvious JVD. Volume status exam is limited to large body habitus -managed with HD.  -cw Digoxin   Anemia  ESRD  IDA Ferritin is 241, Sat ratio 16%. Obtained iron in the middle of January will hold off from IV iron for now.   Sacral Decubitus Ulcer  -Stage IV, healing and mostly scar tissue at this time. Important to prevent re-injury with frequent repositioning.  -Foam dressing needs to be in place over sacral area.  -RD consult pending.   Hidradenitis Suppurativa This patient has not had treatment for his HS and is in significant pain for this. No tunnels actively or draining nodules at this time, the ones on his thighs and buttocks are very deep and painful. Too deep for IL steroid injections and too many to treat at this time. May benefit from outpatient treatment and referral to dermatology.   -Tylenol  1000 mg q6hrs PRN  -Triamcinolone  ointment over left lateral thigh  -Cover with foam dressing.    Paroxysmal Afib EKG showed Afib with tachycardia. Will continue home medication  -cw Eliquis  5 mg BID and Digoxin  0.125 mg TID.   Chronic pain/neuropathy Hyperanalgesic on exam. Severely tender.  -cw venlafaxine  37.5mg  daily  -cw lyrica  50mg  daily  -cw dilaudid  4mg  q6hrs PRN   Hypothyroidism: Will check TSH and free T4 today. Cw synthroid  25 mcg daily   Best  Practice: Diet: Renal diet VTE: Apixaban   Signature: [facility]  Internal Medicine Resident, PGY-1 [facility]  Pager: [phone] 1:35 PM, 09/02/2023   Please contact the on call pager after 5 pm and on weekends at [phone].

## 2023-09-02 NOTE — Care Management Important Message (Signed)
 Important Message  Patient Details  Name: Phillip Young MRN: [id number] Date of Birth: 1986/03/22   Important Message Given:  Yes - Medicare IM     Wynonia Hedges 09/02/2023, 3:12 PM

## 2023-09-02 NOTE — Progress Notes (Addendum)
 [facility] Progress Note   Subjective:    Seen and examined patient at bedside. Sitting up at side of bed. Reports feeling well and denies SOB, CP, and N/V. Tolerated yesterday's HD with net UF 5.7L. Next HD 2/8.  Objective Vitals:   09/01/23 1348 09/01/23 2056 09/02/23 0500 09/02/23 0808  BP: 93/68 107/81 109/85 105/88  Pulse: 70 97 97 (!) 103  Resp:  18  20  Temp: 97.8 F (36.6 C) 97.9 F (36.6 C) 97.8 F (36.6 C) 98.2 F (36.8 C)  TempSrc: Oral   Oral  SpO2: 100% 100% 100% 100%  Weight:      Height:       Physical Exam General: Awake, alert, obese, on 5L O2 via [place], NAD Heart: RRR; No rubs or gallops Lungs: Clear anteriorly. Breathing unlabored Abdomen: Large, soft, and non-tender Extremities: 2+ diffuse bilateral LE edema Dialysis Access: R[facility]   Filed Weights   08/30/23 1840 09/01/23 0845 09/01/23 1255  Weight: (!) 161.8 kg (!) 163.6 kg (!) 157.9 kg    Intake/Output Summary (Last 24 hours) at 09/02/2023 0818 Last data filed at 09/01/2023 1832 Gross per 24 hour  Intake 600 ml  Output 5700 ml  Net -5100 ml    Additional Objective Labs: Basic Metabolic Panel: Recent Labs  Lab 08/30/23 0540 08/31/23 0348 09/01/23 0343 09/02/23 0134  NA 133* 133* 134* 136  K 4.6 4.2 4.5 4.3  CL 92* 93* 94* 97*  CO2 17* 20* 20* 22  GLUCOSE 91 122* 100* 101*  BUN 87* 55* 69* 47*  CREATININE 15.15* 10.84* 12.06* 8.68*  CALCIUM  9.5 9.6 9.2 9.7  PHOS 9.3*  --  7.8* 5.9*   Liver Function Tests: Recent Labs  Lab 08/30/23 0250 08/30/23 0540 09/01/23 0343 09/02/23 0134  AST 22  --   --   --   ALT 26  --   --   --   ALKPHOS 141*  --   --   --   BILITOT 1.3*  --   --   --   PROT 7.1  --   --   --   ALBUMIN  3.5 3.4* 3.4* 3.3*   No results for input(s): LIPASE, AMYLASE in the last 168 hours. CBC: Recent Labs  Lab 08/30/23 0250 08/30/23 0257 08/31/23 0348 09/01/23 0343 09/02/23 0134  WBC 7.8  --  8.5 6.8 7.9  NEUTROABS 6.0  --   --   --   --    HGB 9.8*   < > 10.3* 9.9* 9.9*  HCT 30.5*   < > 31.2* 29.7* 30.3*  MCV 102.3*  --  100.6* 101.0* 102.4*  PLT 241  --  242 222 206   < > = values in this interval not displayed.   Blood Culture    Component Value Date/Time   SDES BLOOD BLOOD RIGHT HAND 05/27/2023 0959   SPECREQUEST  05/27/2023 0959    BOTTLES DRAWN AEROBIC AND ANAEROBIC Blood Culture adequate volume   CULT  05/27/2023 0959    NO GROWTH 5 DAYS Performed at [facility], [address] [place], [place] [address]    REPTSTATUS 06/01/2023 FINAL 05/27/2023 0959    Cardiac Enzymes: No results for input(s): CKTOTAL, CKMB, CKMBINDEX, TROPONINI in the last 168 hours. CBG: Recent Labs  Lab 09/01/23 0728 09/01/23 1402 09/01/23 1658 09/01/23 2048 09/02/23 0809  GLUCAP 101* 102* 107* 106* 143*   Iron Studies:  Recent Labs    08/31/23 0348  IRON 58  TIBC 364  FERRITIN 241   Lab Results  Component Value Date   INR 2.0 (H) 06/10/2022   INR 2.8 (H) 06/09/2022   INR 2.9 (H) 06/07/2022   Studies/Results: No results found.  Medications:   apixaban   5 mg Oral BID   Chlorhexidine  Gluconate Cloth  6 each Topical Q0600   cholecalciferol   400 Units Oral Daily   cinacalcet   30 mg Oral Q M,W,F-HD   [START ON 09/03/2023] Darbepoetin Alfa   150 mcg Subcutaneous Q Sat-1800   digoxin   0.125 mg Oral Once per day on Monday Wednesday Friday   insulin  aspart  0-5 Units Subcutaneous QHS   insulin  aspart  0-6 Units Subcutaneous TID WC   levothyroxine   25 mcg Oral Q0600   lidocaine   1 patch Transdermal Q24H   midodrine   30 mg Oral TID WC   pregabalin   50 mg Oral Daily   sevelamer  carbonate  1,600 mg Oral TID WC   triamcinolone  ointment   Topical BID   venlafaxine  XR  37.5 mg Oral Daily    Dialysis Orders: [facility] MWF From dec 2024 -->  4.5h   2/2 bath  155.5kg  RIJ TDC  Heparin  none Micera 200mcg every 2 weeks - last dose 08/24/23 Venofer 100mg  load - last dose 08/24/23. Last dose scheduled for 09/02/23    Renal-related home meds: - sensipar  30 every day - fosrenol  1 gm ac tid - midodrine  30 tid - others: effexor  xr, lyrica , T4, digoxin , eliquis   Assessment/Plan: Acute on chronic hypoxic and hypercarbic resp failure - likely due to vol overload from missed HD. Is on O2 2-3L at home.  ESRD - on HD MWF. Last HD was Wed 1/29. 6L removed 2/4 and 5.7L removed yesterday. Next HD 2/8 and will transition back to MWF schedule next week. K+ now stable.  Chronic hypotension - on high dose midodrine  30 tid, continue.  Volume - as above has sig LE edema from missed HD. Appears he's getting closer to his EDW per weights here. Continue to push UF as tolerated. Anemia of esrd - Hb 9-10s here. ESA last given on 1/29, not due yet. Secondary hyperparathyroidism - CCa 10.2 and phos is trending down. Cont sensipar . He reports not tolerating Fosrenol  (medication makes him sick) so now on Renvela  while here. HFrEDF - low EF of 30-35% last check in oct 2024 Atrial fib - on digoxin , eliquis  Chronic resp failure - felt to be due to amio lung toxicity. Nutrition - noted he was eating outside food yesterday (foot-long sub). Discussed again on following renal diet and fluid restriction. Dispo - TOC working on transportation issues. We appreciate their assistance.  Charmaine Piety, NP [facility] 09/02/2023,8:18 AM  LOS: 3 days

## 2023-09-03 LAB — CBC WITH DIFFERENTIAL/PLATELET
Abs Immature Granulocytes: 0.01 10*3/uL (ref 0.00–0.07)
Basophils Absolute: 0.1 10*3/uL (ref 0.0–0.1)
Basophils Relative: 1 %
Eosinophils Absolute: 0.7 10*3/uL — ABNORMAL HIGH (ref 0.0–0.5)
Eosinophils Relative: 9 %
HCT: 30.2 % — ABNORMAL LOW (ref 39.0–52.0)
Hemoglobin: 9.8 g/dL — ABNORMAL LOW (ref 13.0–17.0)
Immature Granulocytes: 0 %
Lymphocytes Relative: 13 %
Lymphs Abs: 1 10*3/uL (ref 0.7–4.0)
MCH: 33.4 pg (ref 26.0–34.0)
MCHC: 32.5 g/dL (ref 30.0–36.0)
MCV: 103.1 fL — ABNORMAL HIGH (ref 80.0–100.0)
Monocytes Absolute: 0.4 10*3/uL (ref 0.1–1.0)
Monocytes Relative: 5 %
Neutro Abs: 5.7 10*3/uL (ref 1.7–7.7)
Neutrophils Relative %: 72 %
Platelets: 225 10*3/uL (ref 150–400)
RBC: 2.93 MIL/uL — ABNORMAL LOW (ref 4.22–5.81)
RDW: 16.7 % — ABNORMAL HIGH (ref 11.5–15.5)
WBC: 7.9 10*3/uL (ref 4.0–10.5)
nRBC: 0 % (ref 0.0–0.2)

## 2023-09-03 LAB — RENAL FUNCTION PANEL
Albumin: 3.5 g/dL (ref 3.5–5.0)
Anion gap: 17 — ABNORMAL HIGH (ref 5–15)
BUN: 63 mg/dL — ABNORMAL HIGH (ref 6–20)
CO2: 19 mmol/L — ABNORMAL LOW (ref 22–32)
Calcium: 10 mg/dL (ref 8.9–10.3)
Chloride: 97 mmol/L — ABNORMAL LOW (ref 98–111)
Creatinine, Ser: 10.14 mg/dL — ABNORMAL HIGH (ref 0.61–1.24)
GFR, Estimated: 6 mL/min — ABNORMAL LOW (ref >60)
Glucose, Bld: 110 mg/dL — ABNORMAL HIGH (ref 70–99)
Phosphorus: 7.2 mg/dL — ABNORMAL HIGH (ref 2.5–4.6)
Potassium: 5.2 mmol/L — ABNORMAL HIGH (ref 3.5–5.1)
Sodium: 133 mmol/L — ABNORMAL LOW (ref 135–145)

## 2023-09-03 LAB — GLUCOSE, CAPILLARY
Glucose-Capillary: 98 mg/dL (ref 70–99)
Glucose-Capillary: 116 mg/dL — ABNORMAL HIGH (ref 70–99)
Glucose-Capillary: 141 mg/dL — ABNORMAL HIGH (ref 70–99)

## 2023-09-03 LAB — T4, FREE: Free T4: 0.95 ng/dL (ref 0.61–1.12)

## 2023-09-03 LAB — TSH: TSH: 4.395 u[IU]/mL (ref 0.350–4.500)

## 2023-09-03 MED ORDER — ANTICOAGULANT SODIUM CITRATE 4% (200MG/5ML) IV SOLN
5.0000 mL | Freq: Once | Status: AC
  Administered 2023-09-03: 3800 mL
  Filled 2023-09-03 (×2): qty 5

## 2023-09-03 NOTE — Evaluation (Signed)
 Physical Therapy Evaluation Patient Details Name: Hansen Carino MRN: [id number] DOB: [birth year] Today's Date: 09/03/2023  History of Present Illness  Pt is a 38 y.o. male who presented 08/30/23  with missed dialysis sessions due to transportation issues. PMH: morbid obesity, HTN, prediabetes, OSA on home BiPAP, chronic respirator failure on 2L home oxygen , chronic systolic heart failure due to NICM, ESRD, and PAF.  Clinical Impression  Pt presents to PT with deficits in activity tolerance, cardiopulmonary function, gait, balance, strength, power. Pt is limited largely by BLE foot and leg pain. Pt reports being unable to tolerate light touch to BLE or pressure of his own feet on the floor. This appears to be an ongoing issue dating back multiple months. PT assists pt in bed mobility to reposition in an effort to elevate BLE and reduce edema. Pt may benefit from application of lotion to feet as he reports cracked skin is contributing to his pain. Pt appears very weak at this time and has not stood in multiple days based on his reports. PT will follow in an effort to improve mobility quality and to reduce falls risk. The pt does not appear able to tolerate household level mobility at this time. Patient will benefit from continued inpatient follow up therapy, <3 hours/day.        If plan is discharge home, recommend the following: A lot of help with walking and/or transfers;A lot of help with bathing/dressing/bathroom;Assistance with cooking/housework;Assist for transportation;Help with stairs or ramp for entrance   Can travel by private vehicle   No    Equipment Recommendations  (TBD pending progress)  Recommendations for Other Services       Functional Status Assessment Patient has had a recent decline in their functional status and demonstrates the ability to make significant improvements in function in a reasonable and predictable amount of time.     Precautions / Restrictions  Precautions Precautions: Fall Restrictions Weight Bearing Restrictions Per Provider Order: No      Mobility  Bed Mobility Overal bed mobility: Needs Assistance Bed Mobility: Sit to Supine       Sit to supine: Min assist   General bed mobility comments: assist for BLE management    Transfers Overall transfer level:  (pt declines attempts at transfers due to significant pain in both feet)                      Ambulation/Gait                  Stairs            Wheelchair Mobility     Tilt Bed    Modified Rankin (Stroke Patients Only)       Balance Overall balance assessment: Needs assistance Sitting-balance support: No upper extremity supported, Feet supported Sitting balance-Leahy Scale: Good                                       Pertinent Vitals/Pain Pain Assessment Pain Assessment: Faces Faces Pain Scale: Hurts worst Pain Location: BLE pain Pain Descriptors / Indicators: Sore Pain Intervention(s): Limited activity within patient's tolerance    Home Living Family/patient expects to be discharged to:: Private residence Living Arrangements: Parent Available Help at Discharge: Family;Available 24 hours/day Type of Home: Apartment Home Access: Stairs to enter;Level entry Entrance Stairs-Rails: Right;Left;Can reach both Entrance Stairs-Number of Steps: 15-20 to go downstairs to  his basement apartment, or can go down a hill to the back for a level entrance   Home Layout: One level Home Equipment: Wheelchair - Forensic Psychologist (2 wheels);BSC/3in1      Prior Function Prior Level of Function : Needs assist             Mobility Comments: pt was ambulating short household distances with, up to 60' in November when at [facility], otherwise utilizing a manual wheelchair ADLs Comments: sponge baths 2/2 dialysis port, typically has been able to manage ADLs without assist but started using bed pan at home when he started  missing dialysis & BLE pain increased     Extremity/Trunk Assessment   Upper Extremity Assessment Upper Extremity Assessment: Generalized weakness    Lower Extremity Assessment Lower Extremity Assessment: Generalized weakness (BLE edema, pt reports significant pain to light touch on BLE)    Cervical / Trunk Assessment Cervical / Trunk Assessment: Other exceptions Cervical / Trunk Exceptions: morbid obesity, body habitus  Communication   Communication Communication:  (very soft spoken) Cueing Techniques: Verbal cues  Cognition Arousal: Alert Behavior During Therapy: WFL for tasks assessed/performed Overall Cognitive Status: Within Functional Limits for tasks assessed                                          General Comments General comments (skin integrity, edema, etc.): pt on 5L [place], no significant respiratory distress noted during limited session. PT assists in repositioning the pt in an effort to improve comfort and reduce LE edema.    Exercises     Assessment/Plan    PT Assessment Patient needs continued PT services  PT Problem List Decreased strength;Decreased activity tolerance;Decreased balance;Decreased mobility;Decreased knowledge of use of DME;Decreased safety awareness;Decreased knowledge of precautions;Pain;Impaired sensation       PT Treatment Interventions DME instruction;Gait training;Functional mobility training;Therapeutic activities;Therapeutic exercise;Stair training;Balance training;Neuromuscular re-education;Cognitive remediation;Patient/family education;Wheelchair mobility training    PT Goals (Current goals can be found in the Care Plan section)  Acute Rehab PT Goals Patient Stated Goal: to reduce BLE pain, improve mobility tolerance PT Goal Formulation: With patient Time For Goal Achievement: 09/17/23 Potential to Achieve Goals: Poor (ongoing issue for multiple months, limited progress made at this point) Additional  Goals Additional Goal #1: Pt will mobilize in a manual wheelchair for >50' to demonstrate improvement in activity tolerance and household level mobility    Frequency Min 1X/week     Co-evaluation               AM-PAC PT 6 Clicks Mobility  Outcome Measure Help needed turning from your back to your side while in a flat bed without using bedrails?: A Little Help needed moving from lying on your back to sitting on the side of a flat bed without using bedrails?: A Lot Help needed moving to and from a bed to a chair (including a wheelchair)?: Total Help needed standing up from a chair using your arms (e.g., wheelchair or bedside chair)?: Total Help needed to walk in hospital room?: Total Help needed climbing 3-5 steps with a railing? : Total 6 Click Score: 9    End of Session Equipment Utilized During Treatment: Oxygen  Activity Tolerance: Patient limited by pain Patient left: in bed;with call bell/phone within reach Nurse Communication: Mobility status;Need for lift equipment PT Visit Diagnosis: Other abnormalities of gait and mobility (R26.89);Muscle weakness (generalized) (M62.81);Pain Pain - part  of body: Ankle and joints of foot;Leg (BLE)    Time: 8368-8344 PT Time Calculation (min) (ACUTE ONLY): 24 min   Charges:   PT Evaluation $PT Eval Moderate Complexity: 1 Mod   PT General Charges $$ ACUTE PT VISIT: 1 Visit         Bernardino JINNY Ruth, PT, DPT Acute Rehabilitation Office [phone]   Bernardino JINNY Ruth 09/03/2023, 5:05 PM

## 2023-09-03 NOTE — Progress Notes (Signed)
 Subjective:  seen on hd , sob resolving  , continues on 5 l [place] O2  use O2 at home  at 5 L sometimes   refusing midodrine  .  Objective Vital signs in last 24 hours: Vitals:   09/03/23 1132 09/03/23 1144 09/03/23 1222 09/03/23 1300  BP: 105/69 98/70  102/86  Pulse: 99 (!) 105 (!) 105 (!) 41  Resp: (!) 22 15    Temp:  98.8 F (37.1 C)  98 F (36.7 C)  TempSrc:    Oral  SpO2: 100% 98%  100%  Weight:      Height:       Weight change:   Physical Exam: General: Obese chronically ill Male  , nad  Heart: Irreg irreg  with rate in 90s, no mrg Lungs: CTA  ant  , nonlabored on 5 l [place] O2 Abdomen: Obese, NABS ,  soft, NT, NDm  abd wall edema  Extremities:   Bilat  2+ pedal edema Dialysis  Access: R internal jugular TDC  patent on hd     OP Dialysis Orders: [facility] MWF From dec 2024 -->  4.5h   2/2 bath  155.5kg  RIJ TDC  Heparin  none Micera 200mcg every 2 weeks - last dose 08/24/23 Venofer 100mg  load - last dose 08/24/23. Last dose scheduled for 09/02/23   Renal-related home meds: - sensipar  30 every day - fosrenol  1 gm ac tid - midodrine  30 tid - others: effexor  xr, lyrica , T4, digoxin , eliquis    Problem/Plan: Acute on chronic hypoxic and hypercarbic resp failure -  due to vol overload from missed HD. Is on O2 2-3L at home.  ESRD - on HD MWF. Last HD was Wed 1/29. 6L removed 2/4 and 5.7L removed 2/06 .SABRA Hd today on schedule transitioned back to MWF schedule next week. K+ now stable.  Chronic hypotension - on high dose midodrine  30 tid, ( HE IS Refusing ) despite my counseling today _)  Volume HFrEDF - EF of 30-35% in oct 2024/- as above has sig LE edema from missed HD. Appears he's getting closer to his EDW per weights here. Continue to push UF as tolerated. Anemia of esrd - Hb 9.8 . ESA last given on 1/29, not due yet. Secondary hyperparathyroidism - CCa 10.2 and phos is trending down= 7.2. Cont sensipar . He reports not tolerating Fosrenol  (medication makes him sick) so now on Renvela   while here. Tolerating  Atrial fib - on digoxin , eliquis  Chronic resp failure - felt to be due to amio lung toxicity and 1. Nutrition - noted he was eating outside food yesterday (foot-long sub). Discussed again on following renal diet and fluid restriction. Dispo - TOC working on transportation issues. We appreciate their assistance.  Alm Shown, PA-C [facility] [phone] 09/03/2023,2:57 PM  LOS: 4 days   Labs: Basic Metabolic Panel: Recent Labs  Lab 09/01/23 0343 09/02/23 0134 09/03/23 0457  NA 134* 136 133*  K 4.5 4.3 5.2*  CL 94* 97* 97*  CO2 20* 22 19*  GLUCOSE 100* 101* 110*  BUN 69* 47* 63*  CREATININE 12.06* 8.68* 10.14*  CALCIUM  9.2 9.7 10.0  PHOS 7.8* 5.9* 7.2*   Liver Function Tests: Recent Labs  Lab 08/30/23 0250 08/30/23 0540 09/01/23 0343 09/02/23 0134 09/03/23 0457  AST 22  --   --   --   --   ALT 26  --   --   --   --   ALKPHOS 141*  --   --   --   --  BILITOT 1.3*  --   --   --   --   PROT 7.1  --   --   --   --   ALBUMIN  3.5   < > 3.4* 3.3* 3.5   < > = values in this interval not displayed.   No results for input(s): LIPASE, AMYLASE in the last 168 hours. No results for input(s): AMMONIA in the last 168 hours. CBC: Recent Labs  Lab 08/30/23 0250 08/30/23 0257 08/31/23 0348 09/01/23 0343 09/02/23 0134 09/03/23 0457  WBC 7.8  --  8.5 6.8 7.9 7.9  NEUTROABS 6.0  --   --   --   --  5.7  HGB 9.8*   < > 10.3* 9.9* 9.9* 9.8*  HCT 30.5*   < > 31.2* 29.7* 30.3* 30.2*  MCV 102.3*  --  100.6* 101.0* 102.4* 103.1*  PLT 241  --  242 222 206 225   < > = values in this interval not displayed.   Cardiac Enzymes: No results for input(s): CKTOTAL, CKMB, CKMBINDEX, TROPONINI in the last 168 hours. CBG: Recent Labs  Lab 09/02/23 0809 09/02/23 1209 09/02/23 1620 09/02/23 2103 09/03/23 1224  GLUCAP 143* 98 108* 101* 98    Studies/Results: No results found. Medications:   apixaban   5 mg Oral BID    Chlorhexidine  Gluconate Cloth  6 each Topical Q0600   cholecalciferol   400 Units Oral Daily   cinacalcet   30 mg Oral Q T,Th,Sa-HD   Darbepoetin Alfa   150 mcg Subcutaneous Q Sat-1800   digoxin   0.125 mg Oral Once per day on Monday Wednesday Friday   insulin  aspart  0-5 Units Subcutaneous QHS   insulin  aspart  0-6 Units Subcutaneous TID WC   levothyroxine   25 mcg Oral Q0600   lidocaine   1 patch Transdermal Q24H   midodrine   30 mg Oral TID WC   pregabalin   50 mg Oral Daily   sevelamer  carbonate  1,600 mg Oral TID WC   triamcinolone  ointment   Topical BID   venlafaxine  XR  37.5 mg Oral Daily

## 2023-09-03 NOTE — Progress Notes (Signed)
 PT Cancellation Note  Patient Details Name: Phillip Young MRN: [id number] DOB: 1986/07/26   Cancelled Treatment:    Reason Eval/Treat Not Completed: Patient at procedure or test/unavailable (HD)  Aleck Daring, PT, DPT Acute Rehabilitation Services Office [phone]    [name] 09/03/2023, 7:47 AM

## 2023-09-03 NOTE — Plan of Care (Signed)
  Problem: Coping: Goal: Ability to adjust to condition or change in health will improve Outcome: Progressing   Problem: Fluid Volume: Goal: Ability to maintain a balanced intake and output will improve Outcome: Progressing   Problem: Health Behavior/Discharge Planning: Goal: Ability to identify and utilize available resources and services will improve Outcome: Progressing   Problem: Skin Integrity: Goal: Risk for impaired skin integrity will decrease Outcome: Progressing   Problem: Clinical Measurements: Goal: Ability to maintain clinical measurements within normal limits will improve Outcome: Progressing   Problem: Clinical Measurements: Goal: Will remain free from infection Outcome: Progressing   Problem: Activity: Goal: Risk for activity intolerance will decrease Outcome: Progressing

## 2023-09-03 NOTE — Progress Notes (Signed)
   09/03/23 1144  Vitals  Temp 98.8 F (37.1 C)  Pulse Rate (!) 105  Resp 15  BP 98/70  SpO2 98 %  O2 Device Nasal Cannula  Oxygen  Therapy  O2 Flow Rate (L/min) 5 L/min  Post Treatment  Liters Processed 69.5  Fluid Removed (mL) 5300 mL   Received patient in bed to unit.  Alert and oriented.  Informed consent signed and in chart.   TX duration:3.35 Patient requested to end HD tx 25 mins early, AMA signed. PA Alm aware. Patient tolerated well.  Transported back to the room  Alert, without acute distress.  Hand-off given to patient's nurse.   Access used: R CVC Access issues: None  Total UF removed: Medication(s) given: See MAR    [name], RN [facility]

## 2023-09-03 NOTE — Progress Notes (Signed)
 Hospital day#4 Subjective:   Summary: Phillip Young is a 38 yo with multiple significant chronic conditions including ESRD with HD, HFrEF, Atrial fibrillation on Eliquis  and chronic digoxin , Chronic respiratory failure 2/2 amiodarone  toxicity, BMI 47, Dmw ith polyneropahy admitted for volume overload due to missing HD sessions secondary to lapse in Medicaid for transportation needs, now receiving HD inpatient while working on access to outpatient transport to HD  Overnight Events: None   Feeling well. Denies pain, nausea, chest pain, or increased WOB. Neuropathic pain stable on current therapy. Currently concern about the length of stay in the hospital for HD.   Objective:  Vital signs in last 24 hours: Vitals:   09/03/23 1132 09/03/23 1144 09/03/23 1222 09/03/23 1300  BP: 105/69 98/70  102/86  Pulse: 99 (!) 105 (!) 105 (!) 41  Resp: (!) 22 15    Temp:  98.8 F (37.1 C)  98 F (36.7 C)  TempSrc:    Oral  SpO2: 100% 98%  100%  Weight:      Height:       Supplemental O2: Nasal Cannula SpO2: 100 % O2 Flow Rate (L/min): 5 L/min Filed Weights   08/30/23 1840 09/01/23 0845 09/01/23 1255  Weight: (!) 161.8 kg (!) 163.6 kg (!) 157.9 kg    Physical Exam:  Constitutional:Chronically ill-appearing laying in bed at HD in no acute distress HENT: Moist mucous membranes Neck: supple Cardiovascular: irregular rate and rhythm, no m/r/g Pulmonary/Chest:  Normal WOB, speaking in full sentences. Anterior auscultation with good air exchange Abdominal: Normal BS, soft, non-tender FDX:Jwuzmpnm R chest with HD cath in place in use and without surrounding erythema. Pitting edema bilaterally in LE, improved Neurological: alert & oriented  to self, place, situation, and with proper thought content Skin: warm and dry Psych: Pleasant mood and affect    Intake/Output Summary (Last 24 hours) at 09/03/2023 1408 Last data filed at 09/03/2023 1144 Gross per 24 hour  Intake 480 ml  Output 5450 ml   Net -4970 ml   Net IO Since Admission: -14,390 mL [09/03/23 1408]  Pertinent Labs:    Latest Ref Rng & Units 09/03/2023    4:57 AM 09/02/2023    1:34 AM 09/01/2023    3:43 AM  CBC  WBC 4.0 - 10.5 K/uL 7.9  7.9  6.8   Hemoglobin 13.0 - 17.0 g/dL 9.8  9.9  9.9   Hematocrit 39.0 - 52.0 % 30.2  30.3  29.7   Platelets 150 - 400 K/uL 225  206  222        Latest Ref Rng & Units 09/03/2023    4:57 AM 09/02/2023    1:34 AM 09/01/2023    3:43 AM  CMP  Glucose 70 - 99 mg/dL 889  898  899   BUN 6 - 20 mg/dL 63  47  69   Creatinine 0.61 - 1.24 mg/dL 89.85  1.31  87.93   Sodium 135 - 145 mmol/L 133  136  134   Potassium 3.5 - 5.1 mmol/L 5.2  4.3  4.5   Chloride 98 - 111 mmol/L 97  97  94   CO2 22 - 32 mmol/L 19  22  20    Calcium  8.9 - 10.3 mg/dL 89.9  9.7  9.2     Imaging: No results found.  Assessment/Plan:   Principal Problem:   ESRD (end stage renal disease) on dialysis ([name]) Active Problems:   OSA (obstructive sleep apnea)   Hidradenitis suppurativa  Chronic systolic CHF (congestive heart failure) ([name])   Chronic respiratory failure with hypoxia ([name])   SOB (shortness of breath)   Paroxysmal atrial flutter ([name])   Hypotension   Pressure injury of skin   Neuropathy   Weakness of right foot   Encounter for screening involving social determinants of health (SDoH)  SDOH needs: transportation to HD sessions Functional deconditioning BMI 47 Patient previously covered through [facility] now transitioned to [facility], however, unable to open a case to arrange transportation. TOC SW/CM working in conjunction with Mother Maryelizabeth [phone]) to solve barriers to aid assistance for patient to physically get into assigned transportation. This was previously done with [facility] under [facility] and not currently covered with [facility]. - Appreciate SW efforts; this patient is otherwise stable for discharge. However, given high risk for morbidity without transportation  to HD sessions, patient will need to remain in the hospital.  - Continue to engage with PT   ESRD on HD MWF Chronic hypotension ESRD anemia - HD on 2/8 - Continue midodrine  30 mg TID - Vit D supplementation - Ferritin is 241, Sat ratio 16%. S/p IV iron in Jan 2025; hold off from IV iron for now.   Chronic hypoxic respiratory failure Suspected secondary to amiodarone  toxicity Home supplemental O2, 2L - Wean with goal O2 sat >90%  AoCHRF HFrEF [EF 30-35%]  Last EF 30-35% in October 2024 with moderately decreased LV function and global hypokinsas. GDMT limited due to hypotension. BNP elevated to 1162.6, per evaluation, has a bilateral LE swelling, otherwise no obvious JVD. Volume status exam is limited to large body habitus, but overall improving after 5x HD sessions - Continue with HD - Continue with Digoxin   Chronic atrial fibrillation - On digoxin  0.125 mg three times weekly - Patient is allergic to multiple therapies.  - Continue on Eliquis  BID - If decompensation, will need cardiology involvement for guidance per past hospitalization history  Chronic somatic and neuropathic pain Chronic opiate therapy - Venlafaxine  37.5mg  daily  - Lyrica  50mg  daily  - Dilaudid  4mg  q6hrs PRN   Sacral decubitus ulcer, stage IV - Foam dressing needs to be in place over sacral area.  - RD consult pending.   Hidradenitis Suppurativa  No active treatment and continues to endorse pain, which is exacerbated by limited mobility in bed - Tylenol  1000 mg q6hrs PRN  - Triamcinolone  ointment over left lateral thigh  - Cover with foam dressing.   Hypothyroidism TSH and FT4 within range - Continue Levothyroxine  25 mcg daily  Diet: Renal VTE: Eliquis  Code: Full PT/OT recs: home; will need assistance to be wheeled to and from the house to the car that takes him to HD TOC recs: Ongoing Family Update:  Mother Maryelizabeth [phone])    Dispo: Anticipated discharge to Home pending TOC needs.    Hadassah Kristy Ahr, MD Internal Medicine Resident PGY-2 Please contact the on call pager after 5 pm and on weekends at [phone].

## 2023-09-04 DIAGNOSIS — N186 End stage renal disease: Secondary | ICD-10-CM | POA: Diagnosis not present

## 2023-09-04 DIAGNOSIS — Z992 Dependence on renal dialysis: Secondary | ICD-10-CM | POA: Diagnosis not present

## 2023-09-04 LAB — MAGNESIUM
Magnesium: 1.9 mg/dL (ref 1.7–2.4)
Magnesium: 2 mg/dL (ref 1.7–2.4)

## 2023-09-04 LAB — GLUCOSE, CAPILLARY
Glucose-Capillary: 161 mg/dL — ABNORMAL HIGH (ref 70–99)
Glucose-Capillary: >600 mg/dL (ref 70–99)
Glucose-Capillary: 113 mg/dL — ABNORMAL HIGH (ref 70–99)
Glucose-Capillary: 103 mg/dL — ABNORMAL HIGH (ref 70–99)
Glucose-Capillary: 110 mg/dL — ABNORMAL HIGH (ref 70–99)

## 2023-09-04 LAB — RENAL FUNCTION PANEL
Albumin: 3.2 g/dL — ABNORMAL LOW (ref 3.5–5.0)
Anion gap: 17 — ABNORMAL HIGH (ref 5–15)
BUN: 46 mg/dL — ABNORMAL HIGH (ref 6–20)
CO2: 23 mmol/L (ref 22–32)
Calcium: 9.5 mg/dL (ref 8.9–10.3)
Chloride: 96 mmol/L — ABNORMAL LOW (ref 98–111)
Creatinine, Ser: 8.06 mg/dL — ABNORMAL HIGH (ref 0.61–1.24)
GFR, Estimated: 8 mL/min — ABNORMAL LOW (ref >60)
Glucose, Bld: 101 mg/dL — ABNORMAL HIGH (ref 70–99)
Phosphorus: 5.1 mg/dL — ABNORMAL HIGH (ref 2.5–4.6)
Potassium: 3.8 mmol/L (ref 3.5–5.1)
Sodium: 136 mmol/L (ref 135–145)

## 2023-09-04 MED ORDER — ACETAMINOPHEN 500 MG PO TABS
1000.0000 mg | ORAL_TABLET | Freq: Four times a day (QID) | ORAL | Status: DC
  Administered 2023-09-04 – 2023-09-06 (×8): 1000 mg via ORAL
  Filled 2023-09-04 (×8): qty 2

## 2023-09-04 NOTE — Progress Notes (Signed)
 Hospital day#5 Subjective:   Summary: Phillip Young is a 38 yo with multiple significant chronic conditions including ESRD with HD, HFrEF, Atrial fibrillation on Eliquis  and chronic digoxin , Chronic respiratory failure 2/2 amiodarone  toxicity, BMI 47, Dmw ith polyneropahy admitted for volume overload due to missing HD sessions secondary to lapse in Medicaid for transportation needs, now receiving HD inpatient while working on access to outpatient transport to HD  Overnight Events: None  Feeling much better today. He was sitting in bed, eating. Had a BM this AM. Increased in pain in LE with movement, especially with PT. No chest pain, nausea, vomiting. No changes from Medicaid. Mother aware.  Objective:  Vital signs in last 24 hours: Vitals:   09/03/23 1300 09/03/23 1700 09/03/23 2131 09/04/23 0421  BP: 102/86 90/70 (!) 118/91 (!) 135/105  Pulse: (!) 41 67 (!) 104 93  Resp:   (!) 22 (!) 25  Temp: 98 F (36.7 C) (!) 97.5 F (36.4 C) 99 F (37.2 C) 98.1 F (36.7 C)  TempSrc: Oral Axillary  Oral  SpO2: 100% 100% 100% 99%  Weight:      Height:       Supplemental O2: Nasal Cannula SpO2: 99 % O2 Flow Rate (L/min): 5 L/min FiO2 (%): 40 % Filed Weights   08/30/23 1840 09/01/23 0845 09/01/23 1255  Weight: (!) 161.8 kg (!) 163.6 kg (!) 157.9 kg    Physical Exam:  Constitutional:Chronically ill-appearing laying in bed in no acute distress HENT: Moist mucous membranes Neck: supple Cardiovascular:irregular rate and rhythm Pulmonary/Chest:  Normal WOB with good inspiratory effort on anterior auscultation Abdominal: Normal BS, soft, non-tender MSK: Anterior R chest with HD cath in place without surrounding erythema. Pitting edema bilaterally in LE Neurological: alert & oriented  to self, place, situation, and with proper thought content, unchanged Skin: warm and dry Psych: Pleasant mood and affect    Intake/Output Summary (Last 24 hours) at 09/04/2023 0601 Last data filed at  09/03/2023 2100 Gross per 24 hour  Intake 240 ml  Output 5300 ml  Net -5060 ml   Net IO Since Admission: -14,150 mL [09/04/23 0601]  Pertinent Labs:    Latest Ref Rng & Units 09/03/2023    4:57 AM 09/02/2023    1:34 AM 09/01/2023    3:43 AM  CBC  WBC 4.0 - 10.5 K/uL 7.9  7.9  6.8   Hemoglobin 13.0 - 17.0 g/dL 9.8  9.9  9.9   Hematocrit 39.0 - 52.0 % 30.2  30.3  29.7   Platelets 150 - 400 K/uL 225  206  222        Latest Ref Rng & Units 09/04/2023    1:56 AM 09/03/2023    4:57 AM 09/02/2023    1:34 AM  CMP  Glucose 70 - 99 mg/dL 898  889  898   BUN 6 - 20 mg/dL 46  63  47   Creatinine 0.61 - 1.24 mg/dL 1.93  89.85  1.31   Sodium 135 - 145 mmol/L 136  133  136   Potassium 3.5 - 5.1 mmol/L 3.8  5.2  4.3   Chloride 98 - 111 mmol/L 96  97  97   CO2 22 - 32 mmol/L 23  19  22    Calcium  8.9 - 10.3 mg/dL 9.5  89.9  9.7     Imaging: No results found.  Assessment/Plan:   Principal Problem:   ESRD (end stage renal disease) on dialysis [facility]) Active Problems:   OSA (  obstructive sleep apnea)   Hidradenitis suppurativa   Chronic systolic CHF (congestive heart failure) ([name])   Chronic respiratory failure with hypoxia ([name])   SOB (shortness of breath)   Paroxysmal atrial flutter ([name])   Hypotension   Pressure injury of skin   Neuropathy   Weakness of right foot   Encounter for screening involving social determinants of health (SDoH)  SDOH needs: transportation to HD sessions Functional deconditioning BMI 47 Patient previously covered through [facility] now transitioned to [facility], however, unable to open a case to arrange transportation. TOC SW/CM working in conjunction with Mother Maryelizabeth [phone]) to solve barriers to aid assistance for patient to physically get into assigned transportation. This was previously done with [facility] under [facility] and not currently covered with [facility]. - Appreciate SW continued efforts; this patient is otherwise stable  for discharge. However, given high risk for morbidity without transportation to HD sessions, patient will need to remain in the hospital.  - Continue to engage with PT; added scheduled Tylenol  QID  ESRD on HD MWF Chronic hypotension ESRD anemia Improvement in patient's volume status, close to estimated dry weight. Now transitioned back to MWF schedule.  - Appreciate nephrology's HD management - Continue midodrine  30 mg TID - Vit D supplementation - Ferritin is 241, Sat ratio 16%. S/p IV iron in Jan 2025; hold off from IV iron for now.   Chronic hypoxic respiratory failure Suspected secondary to amiodarone  toxicity Home supplemental O2, 2L - Wean with goal O2 sat >90%  AoCHRF HFrEF [EF 30-35%]  Last EF 30-35% in October 2024 with moderately decreased LV function and global hypokinsas. GDMT limited due to hypotension. BNP elevated to 1162.6, per evaluation, has a bilateral LE swelling, otherwise no obvious JVD. Volume status exam is limited to large body habitus, but overall improving after HD sessions - Continue with HD, close to estimated dry weight - Continue with Digoxin   Chronic atrial fibrillation - On digoxin  0.125 mg three times weekly - Patient is allergic to multiple therapies.  - Continue on Eliquis  BID - If decompensation, will need cardiology involvement for guidance per past hospitalization history  Chronic somatic and neuropathic pain Chronic opiate therapy Attempted to work with PT on 2/8 but continues to endorse increased pain with attempted ambulation - Scheduled Tylenol  100 mg QID  - Venlafaxine  37.5mg  daily  - Lyrica  50mg  daily  - Dilaudid  4mg  q6hrs PRN   Sacral decubitus ulcer, stage IV - Foam dressing needs to be in place over sacral area.  - RD consult pending.   Hidradenitis Suppurativa  No active treatment and continues to endorse pain, which is exacerbated by limited mobility in bed - Tylenol  1000 mg q6hrs PRN  - Triamcinolone  ointment over left  lateral thigh  - Cover with foam dressing.   Hypothyroidism TSH and FT4 within range - Continue Levothyroxine  25 mcg daily  Diet: Renal VTE: Eliquis  Code: Full PT/OT recs: home; will need assistance to be wheeled to and from the house to the car that takes him to HD TOC recs: Ongoing Family Update:  Mother Maryelizabeth [phone])   Dispo: Anticipated discharge to Home pending TOC needs.   Hadassah Kristy Ahr, MD Internal Medicine Resident PGY-2 Please contact the on call pager after 5 pm and on weekends at [phone].

## 2023-09-04 NOTE — Progress Notes (Signed)
 Subjective: Seen in room, said tolerated HD 5.3L UF yest,no cos sitting on side of bed eating large salad as his breakfast multiple cups of fluid on tray, reminded of fluid restrictions  Objective Vital signs in last 24 hours: Vitals:   09/03/23 1700 09/03/23 2131 09/04/23 0421 09/04/23 0815  BP: 90/70 (!) 118/91 (!) 135/105 (!) 132/96  Pulse: 67 (!) 104 93 (!) 111  Resp:  (!) 22 (!) 25   Temp: (!) 97.5 F (36.4 C) 99 F (37.2 C) 98.1 F (36.7 C) 98.5 F (36.9 C)  TempSrc: Axillary  Oral Oral  SpO2: 100% 100% 99% 100%  Weight:      Height:       Weight change:   Physical Exam: General: Obese chronically ill Male  , nad  Heart: Irreg irreg  with rate in 90s, no mrg Lungs: CTA  ant  , nonlabored on 5 l [employer] O2 O2 sat 100% Abdomen: Obese, NABS ,  soft, NT, ND abd wall edema  Extremities: Bilat  2+ pedal edema  Dialysis Access: R  internal jugular TDC       OP Dialysis Orders: [facility] MWF From dec 2024 -->  4.5h   2/2 bath  155.5kg  RIJ TDC  Heparin  none Micera 200mcg every 2 weeks - last dose 08/24/23 Venofer 100mg  load - last dose 08/24/23. Last dose scheduled for 09/02/23   Renal-related home meds: - sensipar  30 every day - fosrenol  1 gm ac tid - midodrine  30 tid - others: effexor  xr, lyrica , T4, digoxin , eliquis    Problem/Plan: Acute on chronic hypoxic and hypercarbic resp failure -  due to vol overload from missed HD.  Slowly improving with HD UF, is on home O2 varies he tells me  3 to 5 L . ESRD - on HD MWF. Last  OP HD was Wed 1/29.  Had  6L removed 2/4 and 5.7L removed 2/06 . HD 5,3 L Uf 2/08/transition back to MWF schedule next HD  2/10/ K+ now stable.  Chronic hypotension - on high dose midodrine  30 tid, ( yest refused  in HD  despite my counseling _)  Volume HFrEDF - EF of 30-35% in oct 2024/- as above has sig LE edema from missed HD. Appears he's getting closer to his EDW per weights here. Continue to push UF as tolerated. Anemia of esrd - Hb 9.8 . ESA last given on  1/29, not due yet. Secondary hyperparathyroidism - CCa 10.2 and phos is trending down= 5.1< 7.2. Cont sensipar . He reports not tolerating Fosrenol  (medication makes him sick) so now on Renvela  while here. Tolerating  Atrial fib - on digoxin , eliquis  Chronic resp failure - felt to be due to amio lung toxicity and 1. Nutrition - noted he was eating outside food  past week  (foot-long sub). Discussed again on following renal diet and fluid restriction. Dispo - TOC working on transportation issues. We appreciate their assistance.SABRA Alm Shown, PA-C [facility] [phone] 09/04/2023,10:36 AM  LOS: 5 days   Labs: Basic Metabolic Panel: Recent Labs  Lab 09/02/23 0134 09/03/23 0457 09/04/23 0156  NA 136 133* 136  K 4.3 5.2* 3.8  CL 97* 97* 96*  CO2 22 19* 23  GLUCOSE 101* 110* 101*  BUN 47* 63* 46*  CREATININE 8.68* 10.14* 8.06*  CALCIUM  9.7 10.0 9.5  PHOS 5.9* 7.2* 5.1*   Liver Function Tests: Recent Labs  Lab 08/30/23 0250 08/30/23 0540 09/02/23 0134 09/03/23 0457 09/04/23 0156  AST 22  --   --   --   --  ALT 26  --   --   --   --   ALKPHOS 141*  --   --   --   --   BILITOT 1.3*  --   --   --   --   PROT 7.1  --   --   --   --   ALBUMIN  3.5   < > 3.3* 3.5 3.2*   < > = values in this interval not displayed.   No results for input(s): LIPASE, AMYLASE in the last 168 hours. No results for input(s): AMMONIA in the last 168 hours. CBC: Recent Labs  Lab 08/30/23 0250 08/30/23 0257 08/31/23 0348 09/01/23 0343 09/02/23 0134 09/03/23 0457  WBC 7.8  --  8.5 6.8 7.9 7.9  NEUTROABS 6.0  --   --   --   --  5.7  HGB 9.8*   < > 10.3* 9.9* 9.9* 9.8*  HCT 30.5*   < > 31.2* 29.7* 30.3* 30.2*  MCV 102.3*  --  100.6* 101.0* 102.4* 103.1*  PLT 241  --  242 222 206 225   < > = values in this interval not displayed.   Cardiac Enzymes: No results for input(s): CKTOTAL, CKMB, CKMBINDEX, TROPONINI in the last 168 hours. CBG: Recent Labs  Lab  09/03/23 1224 09/03/23 1708 09/03/23 2129 09/04/23 0757 09/04/23 0810  GLUCAP 98 116* 141* >600* 161*    Studies/Results: No results found. Medications:   acetaminophen   1,000 mg Oral Q6H   apixaban   5 mg Oral BID   Chlorhexidine  Gluconate Cloth  6 each Topical Q0600   cholecalciferol   400 Units Oral Daily   cinacalcet   30 mg Oral Q T,Th,Sa-HD   Darbepoetin Alfa   150 mcg Subcutaneous Q Sat-1800   digoxin   0.125 mg Oral Once per day on Monday Wednesday Friday   insulin  aspart  0-5 Units Subcutaneous QHS   insulin  aspart  0-6 Units Subcutaneous TID WC   levothyroxine   25 mcg Oral Q0600   lidocaine   1 patch Transdermal Q24H   midodrine   30 mg Oral TID WC   pregabalin   50 mg Oral Daily   sevelamer  carbonate  1,600 mg Oral TID WC   triamcinolone  ointment   Topical BID   venlafaxine  XR  37.5 mg Oral Daily

## 2023-09-04 NOTE — Plan of Care (Signed)
  Problem: Fluid Volume: Goal: Ability to maintain a balanced intake and output will improve Outcome: Progressing   Problem: Health Behavior/Discharge Planning: Goal: Ability to identify and utilize available resources and services will improve Outcome: Progressing   Problem: Metabolic: Goal: Ability to maintain appropriate glucose levels will improve Outcome: Progressing   Problem: Nutritional: Goal: Maintenance of adequate nutrition will improve Outcome: Progressing   Problem: Tissue Perfusion: Goal: Adequacy of tissue perfusion will improve Outcome: Progressing   Problem: Skin Integrity: Goal: Risk for impaired skin integrity will decrease Outcome: Progressing

## 2023-09-05 DIAGNOSIS — N186 End stage renal disease: Secondary | ICD-10-CM | POA: Diagnosis not present

## 2023-09-05 DIAGNOSIS — Z992 Dependence on renal dialysis: Secondary | ICD-10-CM | POA: Diagnosis not present

## 2023-09-05 LAB — RENAL FUNCTION PANEL
Albumin: 3.3 g/dL — ABNORMAL LOW (ref 3.5–5.0)
Anion gap: 16 — ABNORMAL HIGH (ref 5–15)
BUN: 60 mg/dL — ABNORMAL HIGH (ref 6–20)
CO2: 24 mmol/L (ref 22–32)
Calcium: 10.6 mg/dL — ABNORMAL HIGH (ref 8.9–10.3)
Chloride: 97 mmol/L — ABNORMAL LOW (ref 98–111)
Creatinine, Ser: 9.53 mg/dL — ABNORMAL HIGH (ref 0.61–1.24)
GFR, Estimated: 7 mL/min — ABNORMAL LOW (ref >60)
Glucose, Bld: 107 mg/dL — ABNORMAL HIGH (ref 70–99)
Phosphorus: 5.9 mg/dL — ABNORMAL HIGH (ref 2.5–4.6)
Potassium: 5 mmol/L (ref 3.5–5.1)
Sodium: 137 mmol/L (ref 135–145)

## 2023-09-05 LAB — CBC
HCT: 30.8 % — ABNORMAL LOW (ref 39.0–52.0)
Hemoglobin: 10.1 g/dL — ABNORMAL LOW (ref 13.0–17.0)
MCH: 34 pg (ref 26.0–34.0)
MCHC: 32.8 g/dL (ref 30.0–36.0)
MCV: 103.7 fL — ABNORMAL HIGH (ref 80.0–100.0)
Platelets: 203 10*3/uL (ref 150–400)
RBC: 2.97 MIL/uL — ABNORMAL LOW (ref 4.22–5.81)
RDW: 16.6 % — ABNORMAL HIGH (ref 11.5–15.5)
WBC: 7.7 10*3/uL (ref 4.0–10.5)
nRBC: 0 % (ref 0.0–0.2)

## 2023-09-05 LAB — GLUCOSE, CAPILLARY
Glucose-Capillary: 105 mg/dL — ABNORMAL HIGH (ref 70–99)
Glucose-Capillary: 102 mg/dL — ABNORMAL HIGH (ref 70–99)
Glucose-Capillary: 104 mg/dL — ABNORMAL HIGH (ref 70–99)

## 2023-09-05 MED ORDER — ANTICOAGULANT SODIUM CITRATE 4% (200MG/5ML) IV SOLN
5.0000 mL | Status: DC | PRN
Start: 2023-09-05 — End: 2023-09-07
  Administered 2023-09-05: 5 mL
  Filled 2023-09-05 (×3): qty 5

## 2023-09-05 NOTE — Progress Notes (Signed)
 Received patient in bed to unit.  Alert and oriented.  Informed consent signed and in chart.   TX duration:3hrs - pt refused to complete tx, AMA signed and placed into pt chart - education provided r/t risks and harm caused by incomplete tx and fluid volume excess  Patient tolerated well.  Transported back to the room  Alert, without acute distress.  Hand-off given to patient's nurse.   Access used: RTDC Access issues: none  Total UF removed: 4.5 Medication(s) given: post tx sodium citrate  block   09/05/23 1840  Vitals  Temp 98 F (36.7 C)  BP 124/82  Pulse Rate (!) 114  ECG Heart Rate (!) 125  Resp 20  Oxygen  Therapy  SpO2 100 %  During Treatment Monitoring  Blood Flow Rate (mL/min) 199 mL/min  Arterial Pressure (mmHg) -74.34 mmHg  Venous Pressure (mmHg) 81 mmHg  TMP (mmHg) 12.12 mmHg  Ultrafiltration Rate (mL/min) 1354 mL/min  Dialysate Flow Rate (mL/min) 299 ml/min  Dialysate Potassium Concentration 2  Dialysate Calcium  Concentration 2.5  Duration of HD Treatment -hour(s) 3.2 hour(s)  Cumulative Fluid Removed (mL) per Treatment  4574.88  HD Safety Checks Performed Yes  Intra-Hemodialysis Comments Tx completed  Dialysis Fluid Bolus Normal Saline  Bolus Amount (mL) 300 mL  Post Treatment  Dialyzer Clearance Lightly streaked  Liters Processed 73.4  Fluid Removed (mL) 4500 mL  Tolerated HD Treatment Yes  Hemodialysis Catheter Right Internal jugular Double lumen Permanent (Tunneled)  Placement Date/Time: 05/30/23 0856   Serial / Lot #: [id number]  Expiration Date: 09/23/27  Time Out: Correct patient;Correct site;Correct procedure  Maximum sterile barrier precautions: Hand hygiene;Cap;Mask;Sterile gown;Sterile gloves;Large sterile s...  Site Condition No complications  Catheter fill volume (Arterial) 1.9 cc  Catheter fill volume (Venous) 1.9  Post treatment catheter status Capped and Clamped      Gaile Jourdain, RN [facility] Unit

## 2023-09-05 NOTE — [place] FL2 (Addendum)
 [place]  MEDICAID FL2 LEVEL OF CARE FORM     IDENTIFICATION  Patient Name: Phillip Young Birthdate: 04-22-1986 Sex: male Admission Date (Current Location): 08/30/2023  [facility] and [place] Number:  Producer, television/film/video and Address:  [facility], [address] [place], [place] [address]      Provider Number: [id number]  Attending Physician Name and Address:  Bevelyn Bryant, MD  Relative Name and Phone Number:       Current Level of Care: Hospital Recommended Level of Care: Skilled Nursing Facility Prior Approval Number:    Date Approved/Denied:   PASRR Number: [id number]  Discharge Plan: SNF    Current Diagnoses: Patient Active Problem List   Diagnosis Date Noted   Encounter for screening involving social determinants of health (SDoH) 08/30/2023   Weakness of right foot 08/25/2023   ESRD needing dialysis ([name]) 07/02/2023   Hoarseness of voice 06/21/2023   Adjustment disorder with mixed anxiety and depressed mood 06/10/2023   Metabolic encephalopathy 06/07/2023   Severe muscle deconditioning 06/03/2023   HFrEF (heart failure with reduced ejection fraction) ([name]) 06/01/2023   Neuropathy 05/28/2023   MSSA bacteremia 05/26/2023   Hyperkalemia 03/21/2023   Central line infection 03/10/2023   Pressure injury of skin 01/23/2023   Acute hypoxic respiratory failure ([name]) 01/21/2023   Atrial fibrillation ([name]) 01/21/2023   Acute on chronic hypoxic respiratory failure ([name]) 08/07/2022   Congestive heart failure ([name]) 08/05/2022   Elevated liver enzymes 06/08/2022   Multiple nodules of lung 05/31/2022   History of Gram positive sepsis    Advanced care planning/counseling discussion    Recurrent infections 09/30/2021   Reactive depression 09/02/2021   Hypervolemia 08/18/2021   Type 2 diabetes mellitus with diabetic chronic kidney disease ([name]) 06/26/2021   Medical non-compliance 04/21/2021   Hypotension 04/21/2021   ESRD (end stage renal disease) on  dialysis ([name]) 04/16/2021   Paroxysmal atrial flutter ([name]) 04/12/2021   SOB (shortness of breath)    Cardiogenic shock ([name])    Chronic respiratory failure with hypoxia ([name]) 02/11/2021   Chronic systolic CHF (congestive heart failure) ([name])    Chronic cough    Morbid obesity ([name]) 02/26/2020   GERD without esophagitis 02/26/2020   OSA (obstructive sleep apnea) 02/26/2020   Hidradenitis suppurativa 02/26/2020    Orientation RESPIRATION BLADDER Height & Weight     Self, Time, Situation, Place  O2 (5L nasal cannula) Continent Weight: (!) 348 lb 1.7 oz (157.9 kg) Height:  6' (182.9 cm)  BEHAVIORAL SYMPTOMS/MOOD NEUROLOGICAL BOWEL NUTRITION STATUS      Continent Diet (see d/c summary)  AMBULATORY STATUS COMMUNICATION OF NEEDS Skin   Extensive Assist Verbally Normal                       Personal Care Assistance Level of Assistance  Bathing, Dressing, Feeding Bathing Assistance: Maximum assistance Feeding assistance: Independent Dressing Assistance: Maximum assistance     Functional Limitations Info  Sight, Hearing, Speech Sight Info: Adequate Hearing Info: Adequate Speech Info: Adequate    SPECIAL CARE FACTORS FREQUENCY  OT (By licensed OT), PT (By licensed PT)     PT Frequency: 5x/week OT Frequency: 5x/week            Contractures Contractures Info: Not present    Additional Factors Info  Code Status, Allergies Code Status Info: full code Allergies Info: Amiodarone , heparin , Coreg  (carvedilol ), metoprolol , Amoxicillin , Steroids           Current Medications (09/05/2023):  This is the current hospital active medication list Current Facility-Administered Medications  Medication Dose Route Frequency Provider Last Rate Last Admin   acetaminophen  (TYLENOL ) tablet 1,000 mg  1,000 mg Oral Q6H Gomez-Caraballo, Maria, MD   1,000 mg at 09/05/23 0418   anticoagulant sodium citrate  solution 5 mL  5 mL Intracatheter Continuous PRN Charlynne Coombes, PA-C        apixaban  (ELIQUIS ) tablet 5 mg  5 mg Oral BID Nooruddin, Saad, MD   5 mg at 09/04/23 2104   Chlorhexidine  Gluconate Cloth 2 % PADS 6 each  6 each Topical Q0600 Kitty Perkins, NP   6 each at 09/05/23 0418   cholecalciferol  (VITAMIN D3) 10 MCG (400 UNIT) tablet 400 Units  400 Units Oral Daily Nooruddin, Saad, MD   400 Units at 09/04/23 1610   cinacalcet  (SENSIPAR ) tablet 30 mg  30 mg Oral Q T,Th,Sa-HD Tod Forward C, DO   30 mg at 09/03/23 1111   Darbepoetin Alfa  (ARANESP ) injection 150 mcg  150 mcg Subcutaneous Q Sat-1800 Nooruddin, Saad, MD       digoxin  (LANOXIN ) tablet 0.125 mg  0.125 mg Oral Once per day on Monday Wednesday Friday Nooruddin, Saad, MD   0.125 mg at 09/02/23 9604   HYDROmorphone  (DILAUDID ) tablet 4 mg  4 mg Oral Q6H PRN Nooruddin, Saad, MD   4 mg at 09/05/23 0414   insulin  aspart (novoLOG ) injection 0-5 Units  0-5 Units Subcutaneous QHS Nooruddin, Saad, MD       insulin  aspart (novoLOG ) injection 0-6 Units  0-6 Units Subcutaneous TID WC Nooruddin, Saad, MD       levothyroxine  (SYNTHROID ) tablet 25 mcg  25 mcg Oral Q0600 Nooruddin, Saad, MD   25 mcg at 09/05/23 0417   lidocaine  (LIDODERM ) 5 % 1 patch  1 patch Transdermal Q24H Alexander-Savino, [place], MD       midodrine  (PROAMATINE ) tablet 30 mg  30 mg Oral TID WC Nooruddin, Saad, MD       Oral care mouth rinse  15 mL Mouth Rinse PRN Sandie Cross, MD       pregabalin  (LYRICA ) capsule 50 mg  50 mg Oral Daily Nooruddin, Saad, MD   50 mg at 09/04/23 5409   sevelamer  carbonate (RENVELA ) tablet 1,600 mg  1,600 mg Oral TID WC Schertz, Robert, MD   1,600 mg at 09/04/23 1747   triamcinolone  ointment (KENALOG ) 0.1 %   Topical BID Alexander-Savino, [place], MD   Given at 09/04/23 8119   venlafaxine  XR (EFFEXOR -XR) 24 hr capsule 37.5 mg  37.5 mg Oral Daily Nooruddin, Saad, MD   37.5 mg at 09/04/23 1478     Discharge Medications: Please see discharge summary for a list of discharge medications.  Relevant Imaging  Results:  Relevant Lab Results:   Additional Information SSN: [id number] [facility]) on MWF 11:55 am chair   Katrinka Parr, [place]

## 2023-09-05 NOTE — TOC Progression Note (Signed)
 Transition of Care [facility]) - Progression Note    Patient Details  Name: Phillip Young MRN: [id number] Date of Birth: 1985/08/03  [facility]) CM/SW Contact  Tom-Johnson, Angelique Ken, RN Phone Number: 09/05/2023, 10:06 AM  Clinical Narrative:     Patient's disposition updated to SNF. CM spoke with patient at bedside and mother, [name] via phone. Both agreed for patient to go to rehab.  [name] updated CM on patient's transportation and Insurance. [name] states patient's Insurance is changed back to [facility] and will start on 09/24/23 and he will be able to use [employer]. In the meantime, patient can use [employer] transportation. DSS has to put him on the schedule and [employer] will transport.  MD and LCSW notified of updated discharge recommendation.   TOC will continue as patient progresses towards discharge.          Expected Discharge Plan and Services                                               Social Determinants of Health (SDOH) Interventions SDOH Screenings   Food Insecurity: No Food Insecurity (08/30/2023)  Housing: Low Risk  (08/30/2023)  Transportation Needs: Unmet Transportation Needs (08/30/2023)  Utilities: Not At Risk (08/30/2023)  Alcohol  Screen: Low Risk  (09/21/2022)  Depression (PHQ2-9): High Risk (06/21/2023)  Financial Resource Strain: Low Risk  (03/24/2023)   Received from [facility]  Physical Activity: Inactive (09/21/2022)  Social Connections: Moderately Isolated (03/24/2023)   Received from [facility]  Stress: No Stress Concern Present (04/21/2023)   Received from [facility]  Tobacco Use: Medium Risk (08/30/2023)    Readmission Risk Interventions    07/05/2023    4:25 PM 05/24/2023    4:43 PM 03/23/2023    3:48 PM  Readmission Risk Prevention Plan  Transportation Screening Complete Complete Complete  PCP or Specialist Appt within 5-7 Days  Complete   Home Care Screening  Complete    Medication Review (RN CM)  Referral to Pharmacy   HRI or Home Care Consult Complete    Social Work Consult for Recovery Care Planning/Counseling Complete    Palliative Care Screening Not Applicable    Medication Review [employer]) Complete  Complete  PCP or Specialist appointment within 3-5 days of discharge   --  HRI or Home Care Consult   Complete  SW Recovery Care/Counseling Consult   Complete  Palliative Care Screening   Not Applicable  Skilled Nursing Facility   Not Applicable

## 2023-09-05 NOTE — Progress Notes (Signed)
 Patient stated he was able to placed himself on BIPAP for the night.

## 2023-09-05 NOTE — Progress Notes (Signed)
 [facility] Progress Note   Subjective:    Seen and examined patient at bedside. Noted patient signed off 25 minutes early on Saturday. We called for patient earlier this morning for HD, but unfortunately, he declined because he wanted to eat his breakfast. Plan for HD this afternoon hopefully. I discussed again on the importance of going to his treatments when called given tight scheduling purposes on the HD unit.  Objective Vitals:   09/04/23 0815 09/04/23 1636 09/04/23 1955 09/05/23 0413  BP: (!) 132/96 128/88 96/74 116/86  Pulse: (!) 111 (!) 108 (!) 57 (!) 125  Resp:  19 20 20   Temp: 98.5 F (36.9 C) 98.5 F (36.9 C) 98.5 F (36.9 C) 98.2 F (36.8 C)  TempSrc: Oral Oral Oral Oral  SpO2: 100% 100% 100% 100%  Weight:      Height:       Physical Exam General: Obese chronically ill Male  , nad  Heart: Irreg irreg  with rate in 90s, no mrg Lungs: CTA  ant  , nonlabored on 5 l [place] O2 O2 sat 100% Abdomen: Obese, NABS ,  soft, NT, ND abd wall edema  Extremities: Bilat  2+ pedal edema  Dialysis Access: R  internal jugular [facility]  Filed Weights   08/30/23 1840 09/01/23 0845 09/01/23 1255  Weight: (!) 161.8 kg (!) 163.6 kg (!) 157.9 kg    Intake/Output Summary (Last 24 hours) at 09/05/2023 1013 Last data filed at 09/05/2023 0600 Gross per 24 hour  Intake 1438 ml  Output 0 ml  Net 1438 ml    Additional Objective Labs: Basic Metabolic Panel: Recent Labs  Lab 09/03/23 0457 09/04/23 0156 09/05/23 0343  NA 133* 136 137  K 5.2* 3.8 5.0  CL 97* 96* 97*  CO2 19* 23 24  GLUCOSE 110* 101* 107*  BUN 63* 46* 60*  CREATININE 10.14* 8.06* 9.53*  CALCIUM  10.0 9.5 10.6*  PHOS 7.2* 5.1* 5.9*   Liver Function Tests: Recent Labs  Lab 08/30/23 0250 08/30/23 0540 09/03/23 0457 09/04/23 0156 09/05/23 0343  AST 22  --   --   --   --   ALT 26  --   --   --   --   ALKPHOS 141*  --   --   --   --   BILITOT 1.3*  --   --   --   --   PROT 7.1  --   --   --   --    ALBUMIN  3.5   < > 3.5 3.2* 3.3*   < > = values in this interval not displayed.   No results for input(s): "LIPASE", "AMYLASE" in the last 168 hours. CBC: Recent Labs  Lab 08/30/23 0250 08/30/23 0257 08/31/23 0348 09/01/23 0343 09/02/23 0134 09/03/23 0457 09/05/23 0343  WBC 7.8  --  8.5 6.8 7.9 7.9 7.7  NEUTROABS 6.0  --   --   --   --  5.7  --   HGB 9.8*   < > 10.3* 9.9* 9.9* 9.8* 10.1*  HCT 30.5*   < > 31.2* 29.7* 30.3* 30.2* 30.8*  MCV 102.3*  --  100.6* 101.0* 102.4* 103.1* 103.7*  PLT 241  --  242 222 206 225 203   < > = values in this interval not displayed.   Blood Culture    Component Value Date/Time   SDES BLOOD BLOOD RIGHT HAND 05/27/2023 0959   SPECREQUEST  05/27/2023 0959    BOTTLES DRAWN AEROBIC  AND ANAEROBIC Blood Culture adequate volume   CULT  05/27/2023 0959    NO GROWTH 5 DAYS Performed at [facility], [address] [place], [place] [address]    REPTSTATUS 06/01/2023 FINAL 05/27/2023 2355    Cardiac Enzymes: No results for input(s): "CKTOTAL", "CKMB", "CKMBINDEX", "TROPONINI" in the last 168 hours. CBG: Recent Labs  Lab 09/04/23 0810 09/04/23 1129 09/04/23 1634 09/04/23 1957 09/05/23 0818  GLUCAP 161* 113* 103* 110* 105*   Iron Studies: No results for input(s): "IRON", "TIBC", "TRANSFERRIN", "FERRITIN" in the last 72 hours. Lab Results  Component Value Date   INR 2.0 (H) 06/10/2022   INR 2.8 (H) 06/09/2022   INR 2.9 (H) 06/07/2022   Studies/Results: No results found.  Medications:  anticoagulant sodium citrate       acetaminophen   1,000 mg Oral Q6H   apixaban   5 mg Oral BID   Chlorhexidine  Gluconate Cloth  6 each Topical Q0600   cholecalciferol   400 Units Oral Daily   cinacalcet   30 mg Oral Q T,Th,Sa-HD   Darbepoetin Alfa   150 mcg Subcutaneous Q Sat-1800   digoxin   0.125 mg Oral Once per day on Monday Wednesday Friday   insulin  aspart  0-5 Units Subcutaneous QHS   insulin  aspart  0-6 Units Subcutaneous TID WC    levothyroxine   25 mcg Oral Q0600   lidocaine   1 patch Transdermal Q24H   midodrine   30 mg Oral TID WC   pregabalin   50 mg Oral Daily   sevelamer  carbonate  1,600 mg Oral TID WC   triamcinolone  ointment   Topical BID   venlafaxine  XR  37.5 mg Oral Daily    Dialysis Orders:  [facility] MWF From dec 2024 -->  4.5h   2/2 bath  155.5kg  RIJ TDC  Heparin  none Micera 200mcg every 2 weeks - last dose 08/24/23 Venofer 100mg  load - last dose 08/24/23. Last dose scheduled for 09/02/23   Renal-related home meds: - sensipar  30 every day - fosrenol  1 gm ac tid - midodrine  30 tid - others: effexor  xr, lyrica , T4, digoxin , eliquis   Assessment/Plan: Acute on chronic hypoxic and hypercarbic resp failure -  due to vol overload from missed HD.  Slowly improving with HD UF, is on home O2 "varies he tells me  3 to 5 L ." ESRD - on HD MWF. He signed off early on 2/8 and declined HD earlier this morning because he wanted to eat breakfast. I discussed with him again on the importance of HD compliance and staying on treatment full time. He agrees for HD this afternoon. Chronic hypotension - on high dose midodrine  30 TID. Volume HFrEDF - EF of 30-35% in oct 2024/- as above has sig LE edema from missed HD. Appears he's getting closer to his EDW per weights here. Continue to push UF as tolerated. Anemia of esrd - Hb 9.8 . ESA last given on 1/29, not due yet. Secondary hyperparathyroidism - CCa 10.2 and phos is trending down. Cont sensipar . He reports not tolerating Fosrenol  (medication makes him sick) so now on Renvela  while here. Atrial fib - on digoxin , eliquis  Chronic resp failure - felt to be due to amio lung toxicity and 1. Nutrition - noted he was eating outside food  past week  (foot-long sub). Discussed again on following renal diet and fluid restriction. Dispo - TOC working on transportation issues. We appreciate their assistance.Jadene Maxwell, NP [facility] 09/05/2023,10:13 AM  LOS: 6  days

## 2023-09-05 NOTE — Progress Notes (Signed)
 HD#6 Subjective:   Summary: Phillip Young is a 38 y.o. 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 Sitting in bed and eating when seen. Still having pain in his feet. Discussed importance of trying to maintain mobility from recent ina patient rehab admission. Still increased SOB. Would like to see if we can complete some of his planned outpatient workup of his drop foot while he is admitted.   Objective:  Vital signs in last 24 hours: Vitals:   09/04/23 0815 09/04/23 1636 09/04/23 1955 09/05/23 0413  BP: (!) 132/96 128/88 96/74 116/86  Pulse: (!) 111 (!) 108 (!) 57 (!) 125  Resp:  19 20 20   Temp: 98.5 F (36.9 C) 98.5 F (36.9 C) 98.5 F (36.9 C) 98.2 F (36.8 C)  TempSrc: Oral Oral Oral Oral  SpO2: 100% 100% 100% 100%  Weight:      Height:       Supplemental O2: Nasal Cannula SpO2: 100 % O2 Flow Rate (L/min): 5 L/min FiO2 (%): 40 %   Physical Exam:  Constitutional: In no acute distress Cardiovascular:irregular rate and rhythm Pulmonary/Chest: Clear to auscultation bilaterally. No appreciable crackles.  Abdominal: soft, non-tender MSK: Anterior R chest with HD cath in place without surrounding erythema Neurological: alert & oriented  Skin: warm and dry Psych: Pleasant mood and affect  Filed Weights   08/30/23 1840 09/01/23 0845 09/01/23 1255  Weight: (!) 161.8 kg (!) 163.6 kg (!) 157.9 kg      Intake/Output Summary (Last 24 hours) at 09/05/2023 1212 Last data filed at 09/05/2023 0600 Gross per 24 hour  Intake 1438 ml  Output 0 ml  Net 1438 ml   Net  IO Since Admission: -12,352 mL [09/05/23 1212]  Pertinent Labs:    Latest Ref Rng & Units 09/05/2023    3:43 AM 09/03/2023    4:57 AM 09/02/2023    1:34 AM  CBC  WBC 4.0 - 10.5 K/uL 7.7  7.9  7.9   Hemoglobin 13.0 - 17.0 g/dL 11.9  9.8  9.9   Hematocrit 39.0 - 52.0 % 30.8  30.2  30.3   Platelets 150 - 400 K/uL 203  225  206        Latest Ref Rng & Units 09/05/2023    3:43 AM 09/04/2023    1:56 AM 09/03/2023    4:57 AM  CMP  Glucose 70 - 99 mg/dL 147  829  562   BUN 6 - 20 mg/dL 60  46  63   Creatinine 0.61 - 1.24 mg/dL 1.30  8.65  78.46   Sodium 135 - 145 mmol/L 137  136  133   Potassium 3.5 - 5.1 mmol/L 5.0  3.8  5.2   Chloride 98 - 111 mmol/L 97  96  97   CO2 22 - 32 mmol/L 24  23  19    Calcium  8.9 - 10.3 mg/dL 96.2  9.5  95.2     Assessment/Plan:   Principal Problem:   ESRD (end stage renal disease) on dialysis ([name]) Active Problems:   OSA (obstructive sleep apnea)   Hidradenitis suppurativa   Chronic systolic CHF (congestive heart  failure) ([name])   Chronic respiratory failure with hypoxia ([name])   SOB (shortness of breath)   Paroxysmal atrial flutter ([name])   Hypotension   Pressure injury of skin   Neuropathy   Weakness of right foot   Encounter for screening involving social determinants of health [facility])   Patient Summary: SDOH needs: transportation to HD sessions Functional deconditioning BMI 47 Patient previously covered through [name] now transitioned to [facility], however, unable to open a case to arrange transportation.  Per case manager, patient will be transition back to [employer] but this will not take effect until the beginning of next month.  He does have transportation through [employer].  Patient and mother would be amenable to SNF placement per case manager. - Appreciate SW continued efforts; will begin looking for SNF placement.  - Continue to engage with PT   ESRD on HD MWF Chronic hypotension ESRD anemia Improvement in  patient's volume status, close to estimated dry weight. Now transitioned back to MWF schedule.  - Appreciate nephrology's HD management - Continue midodrine  30 mg TID - Vit D supplementation   Chronic hypoxic respiratory failure Suspected secondary to amiodarone  toxicity Home supplemental O2, 2-4L. Currently on 5L - Wean with goal O2 sat >90%   AoCHRF HFrEF [EF 30-35%]  Last EF 30-35% in October 2024 with moderately decreased LV function and global hypokinsas. GDMT limited due to hypotension. Volume improving with HD. No crackles, endorses orthopnea - Continue with HD - Continue with Digoxin    Chronic atrial fibrillation - On digoxin  0.125 mg three times weekly - Patient is allergic to multiple therapies.  - Continue on Eliquis  BID - If decompensation, will need cardiology involvement for guidance per past hospitalization history   Chronic somatic and neuropathic pain Chronic opiate therapy Attempted to work with PT on 2/8 but continues to endorse increased pain with attempted ambulation - Scheduled Tylenol  100 mg QID  - Venlafaxine  37.5mg  daily  - Lyrica  50mg  daily  - Dilaudid  4mg  q6hrs PRN    Sacral decubitus ulcer, stage IV - Foam dressing needs to be in place over sacral area.  - RD consult pending.    Hidradenitis Suppurativa  No active treatment and continues to endorse pain, which is exacerbated by limited mobility in bed - Tylenol  1000 mg q6hrs PRN  - Triamcinolone  ointment over left lateral thigh  - Cover with foam dressing.    Hypothyroidism TSH and FT4 within range - Continue Levothyroxine  25 mcg daily   Diet: Renal VTE: Eliquis  Code: Full PT/OT recs: home; will need assistance to be wheeled to and from the house to the car that takes him to HD TOC recs: Ongoing Family Update:  Mother Phillip Young [phone])     Dispo: Anticipated discharge to Home pending TOC needs / SNF placement.   Sheree Dieter MD Internal Medicine Resident PGY-1 Pager:  [phone] Please contact the on call pager after 5 pm and on weekends at [phone].

## 2023-09-05 NOTE — Plan of Care (Signed)
   Problem: Education: Goal: Ability to describe self-care measures that may prevent or decrease complications (Diabetes Survival Skills Education) will improve Outcome: Completed/Met

## 2023-09-05 NOTE — Plan of Care (Signed)
  Problem: Education: Goal: Individualized Educational Video(s) Outcome: Completed/Met

## 2023-09-06 ENCOUNTER — Other Ambulatory Visit (HOSPITAL_COMMUNITY): Payer: Self-pay

## 2023-09-06 ENCOUNTER — Ambulatory Visit: Payer: Medicare Other | Attending: Physical Therapy | Admitting: Physical Therapy

## 2023-09-06 DIAGNOSIS — I5022 Chronic systolic (congestive) heart failure: Secondary | ICD-10-CM

## 2023-09-06 DIAGNOSIS — G629 Polyneuropathy, unspecified: Secondary | ICD-10-CM

## 2023-09-06 DIAGNOSIS — N186 End stage renal disease: Secondary | ICD-10-CM | POA: Diagnosis not present

## 2023-09-06 DIAGNOSIS — J9611 Chronic respiratory failure with hypoxia: Secondary | ICD-10-CM | POA: Diagnosis not present

## 2023-09-06 LAB — GLUCOSE, CAPILLARY
Glucose-Capillary: 107 mg/dL — ABNORMAL HIGH (ref 70–99)
Glucose-Capillary: 104 mg/dL — ABNORMAL HIGH (ref 70–99)
Glucose-Capillary: 135 mg/dL — ABNORMAL HIGH (ref 70–99)

## 2023-09-06 LAB — RENAL FUNCTION PANEL
Albumin: 3.4 g/dL — ABNORMAL LOW (ref 3.5–5.0)
Anion gap: 18 — ABNORMAL HIGH (ref 5–15)
BUN: 51 mg/dL — ABNORMAL HIGH (ref 6–20)
CO2: 24 mmol/L (ref 22–32)
Calcium: 10.3 mg/dL (ref 8.9–10.3)
Chloride: 95 mmol/L — ABNORMAL LOW (ref 98–111)
Creatinine, Ser: 7.36 mg/dL — ABNORMAL HIGH (ref 0.61–1.24)
GFR, Estimated: 9 mL/min — ABNORMAL LOW (ref >60)
Glucose, Bld: 87 mg/dL (ref 70–99)
Phosphorus: 5.2 mg/dL — ABNORMAL HIGH (ref 2.5–4.6)
Potassium: 4.6 mmol/L (ref 3.5–5.1)
Sodium: 137 mmol/L (ref 135–145)

## 2023-09-06 MED ORDER — SEVELAMER CARBONATE 800 MG PO TABS
1600.0000 mg | ORAL_TABLET | Freq: Three times a day (TID) | ORAL | 0 refills | Status: DC
  Filled 2023-09-06: qty 180, 30d supply, fill #0

## 2023-09-06 MED ORDER — MUPIROCIN CALCIUM 2 % EX CREA
TOPICAL_CREAM | Freq: Two times a day (BID) | CUTANEOUS | 0 refills | Status: DC
  Filled 2023-09-06: qty 15, 3d supply, fill #0

## 2023-09-06 MED ORDER — CHLORHEXIDINE GLUCONATE CLOTH 2 % EX PADS: 6.0000 | MEDICATED_PAD | Freq: Every day | CUTANEOUS | Status: DC

## 2023-09-06 MED ORDER — ACETAMINOPHEN 500 MG PO TABS
1000.0000 mg | ORAL_TABLET | Freq: Four times a day (QID) | ORAL | 0 refills | Status: DC
  Filled 2023-09-06: qty 30, 4d supply, fill #0

## 2023-09-06 MED ORDER — DARBEPOETIN ALFA 150 MCG/0.3ML IJ SOSY: 150.0000 ug | PREFILLED_SYRINGE | INTRAMUSCULAR | Status: DC

## 2023-09-06 MED ORDER — CINACALCET HCL 30 MG PO TABS
30.0000 mg | ORAL_TABLET | ORAL | 0 refills | Status: DC
  Filled 2023-09-06: qty 60, 140d supply, fill #0

## 2023-09-06 MED ORDER — DARBEPOETIN ALFA 150 MCG/0.3ML IJ SOSY: PREFILLED_SYRINGE | INTRAMUSCULAR | Status: DC

## 2023-09-06 MED ORDER — MUPIROCIN CALCIUM 2 % EX CREA
TOPICAL_CREAM | Freq: Two times a day (BID) | CUTANEOUS | Status: DC
Start: 2023-09-06 — End: 2023-09-11
  Filled 2023-09-06: qty 15

## 2023-09-06 MED ORDER — MUPIROCIN CALCIUM 2 % EX CREA
TOPICAL_CREAM | Freq: Two times a day (BID) | CUTANEOUS | 0 refills | Status: DC
  Filled 2023-09-06: qty 15, 7d supply, fill #0

## 2023-09-06 NOTE — Progress Notes (Signed)
Physical Therapy Treatment Patient Details Name: Phillip Young MRN: [id number] DOB: 1985/12/19 Today's Date: 09/06/2023   History of Present Illness Pt is a 38 y.o. male who presented 08/30/23  with missed dialysis sessions due to transportation issues. PMH: morbid obesity, HTN, prediabetes, OSA on home BiPAP, chronic respirator failure on 2L home oxygen, chronic systolic heart failure due to NICM, ESRD, and PAF.    PT Comments  Continuing work on functional mobility and activity tolerance;  Session focused on standing, with good participation; stood at EOB to his RW, and able to stand with good rise from bed, march in place at [facility], and take sidesteps to [facility];   [name] mother present during session via speaker phone, and indicated they have an outpt appt for getting a power wheelchair; this PT in in agreement     If plan is discharge home, recommend the following: A lot of help with walking and/or transfers;A lot of help with bathing/dressing/bathroom;Assistance with cooking/housework;Assist for transportation;Help with stairs or ramp for entrance   Can travel by private vehicle        Equipment Recommendations   (TBD pending progress)    Recommendations for Other Services       Precautions / Restrictions Precautions Precautions: Fall Restrictions Weight Bearing Restrictions Per Provider Order: No     Mobility  Bed Mobility               General bed mobility comments: sitting EOB upon arrival    Transfers Overall transfer level: Needs assistance Equipment used: Rolling walker (2 wheels) Transfers: Sit to/from Stand, Bed to chair/wheelchair/BSC Sit to Stand: Contact guard assist   Step pivot transfers: Min assist, +2 safety/equipment       General transfer comment: CGA for safety; stood with his croc-style shoes on; used his own RW; simulated bed to chair transfer by taking sidesteps to [facility]    Ambulation/Gait                   Stairs              Wheelchair Mobility     Tilt Bed    Modified Rankin (Stroke Patients Only)       Balance     Sitting balance-Leahy Scale: Good       Standing balance-Leahy Scale: Fair                              Hotel manager: No apparent difficulties  Cognition Arousal: Alert Behavior During Therapy: WFL for tasks assessed/performed   PT - Cognitive impairments: No apparent impairments                                Cueing    Exercises      General Comments General comments (skin integrity, edema, etc.): Supported pt on supplemental O2 throughout session; Noted bleading recent bleeding from R great toe, bleeding had stopped; wrapped the toe with kerlix for protection      Pertinent Vitals/Pain Pain Assessment Pain Assessment: Faces Faces Pain Scale: Hurts little more Pain Location: BLE pain Pain Descriptors / Indicators: Sore Pain Intervention(s): Monitored during session    Home Living                          Prior Function  PT Goals (current goals can now be found in the care plan section) Acute Rehab PT Goals Patient Stated Goal: to reduce BLE pain, improve mobility tolerance PT Goal Formulation: With patient Time For Goal Achievement: 09/17/23 Potential to Achieve Goals: Fair Progress towards PT goals: Progressing toward goals    Frequency    Min 1X/week      PT Plan      Co-evaluation              AM-PAC PT "6 Clicks" Mobility   Outcome Measure  Help needed turning from your back to your side while in a flat bed without using bedrails?: None Help needed moving from lying on your back to sitting on the side of a flat bed without using bedrails?: A Little Help needed moving to and from a bed to a chair (including a wheelchair)?: A Little Help needed standing up from a chair using your arms (e.g., wheelchair or bedside chair)?: A Little Help needed to walk in  hospital room?: A Lot Help needed climbing 3-5 steps with a railing? : Total 6 Click Score: 16    End of Session Equipment Utilized During Treatment: Oxygen Activity Tolerance: Patient tolerated treatment well Patient left: in bed;with call bell/phone within reach Nurse Communication: Mobility status PT Visit Diagnosis: Other abnormalities of gait and mobility (R26.89);Muscle weakness (generalized) (M62.81);Pain Pain - part of body: Ankle and joints of foot;Leg (BLE)     Time: 1610-9604 PT Time Calculation (min) (ACUTE ONLY): 24 min  Charges:    $Therapeutic Activity: 23-37 mins PT General Charges $$ ACUTE PT VISIT: 1 Visit                     Van Clines, PT  Acute Rehabilitation Services Office [phone] Secure Chat welcomed    Levi Aland 09/06/2023, 2:34 PM

## 2023-09-06 NOTE — Discharge Instructions (Signed)
You were hospitalized after being unable to get to dialysis. Thank you for allowing [place] to be part of your care.   We arranged for you to follow up at:  Contact information for after-discharge care     Destination     [facility] .   Service: Skilled Nursing Contact information: [address] [place] [address] [phone]                     Please note these changes made to your medications:   Please START taking:  Mupirocin cream 2 times daily for 7 days. Apply to your right big toe Renvela 1600mg  (2 tablets) 3 times daily with meals Tylenol 1000mg  every 6 hours   Please STOP taking:  Lanthanum 1000mg   Continue your other medications as instructed    Please call our clinic if you have any questions or concerns, we may be able to help and keep you from a long and expensive emergency room wait. Our clinic and after hours phone number is [phone], the best time to call is Monday through Friday 9 am to 4 pm but there is always someone available 24/7 if you have an emergency. If you need medication refills please notify your pharmacy one week in advance and they will send [place] a request.

## 2023-09-06 NOTE — Discharge Summary (Deleted)
Name: Phillip Young MRN: [id number] DOB: [birth year] 38 y.o. PCP: Phillip James, DO  Date of Admission: 08/30/2023 12:56 AM Date of Discharge:  09/06/2023 Attending Physician: Dr.  Sol Blazing  DISCHARGE DIAGNOSIS:  Primary Problem: ESRD (end stage renal disease) on dialysis [facility])   Hospital Problems: Principal Problem:   ESRD (end stage renal disease) on dialysis ([name]) Active Problems:   OSA (obstructive sleep apnea)   Hidradenitis suppurativa   Chronic systolic CHF (congestive heart failure) ([name])   Chronic respiratory failure with hypoxia ([name])   SOB (shortness of breath)   Paroxysmal atrial flutter ([name])   Hypotension   Pressure injury of skin   Neuropathy   Weakness of right foot   Encounter for screening involving social determinants of health (SDoH)    DISCHARGE MEDICATIONS:   Allergies as of 09/06/2023       Reactions   Amiodarone Other (See Comments)   Suspicion for amiodarone lung/hepatotoxicity   Coreg [carvedilol] Shortness Of Breath, Diarrhea   Wheezing    Heparin Other (See Comments)   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   apixaban 5 MG Tabs tablet Commonly known as: ELIQUIS Take 1 tablet (5 mg total) by mouth 2 (two) times daily.   cholecalciferol 10 MCG (400 UNIT) Tabs tablet Commonly known as: VITAMIN D3 Take 1 tablet (400 Units total) by mouth daily.    cinacalcet 30 MG tablet Commonly known as: SENSIPAR Take 1 tablet (30 mg total) by mouth Every Tuesday,Thursday,and Saturday with dialysis. What changed:  medication strength when to take this   Darbepoetin Alfa 150 MCG/0.3ML Sosy injection Commonly known as: ARANESP Inject 0.3 mLs (150 mcg total) into the skin every Saturday at 6 PM. What changed: when to take this   digoxin 0.125 MG tablet Commonly known as: LANOXIN Take 1 tablet (0.125 mg total) by mouth 3 (three) times a week. Monday, Wednesday, Friday (dialysis days)   Gerhardt's butt cream Crea Apply 1 Application topically 3 (three) times daily.   HYDROmorphone 4 MG tablet Commonly known as: DILAUDID Take 4 mg by mouth every 6 (six) hours as needed for severe pain (pain score 7-10).   levothyroxine 25 MCG tablet Commonly known as: SYNTHROID Take 1 tablet (25 mcg total) by mouth daily at 6 (six) AM.   lidocaine 5 % ointment Commonly known as: XYLOCAINE Apply 1 Application topically as needed. What changed: reasons to take this   midodrine 10 MG tablet Commonly known as: PROAMATINE Take 3 tablets (30 mg total) by mouth 3 (three) times daily with meals.   mupirocin cream 2 % Commonly known as: BACTROBAN Apply topically 2 (two) times daily for 7 days.   pregabalin 50 MG capsule Commonly known as: LYRICA Take 1 capsule (50 mg total) by mouth daily. Take one capsule daily.  sevelamer carbonate 800 MG tablet Commonly known as: RENVELA Take 2 tablets (1,600 mg total) by mouth 3 (three) times daily with meals.   tirzepatide 2.5 MG/0.5ML injection vial Commonly known as: ZEPBOUND Inject 2.5 mg into the skin once a week.   venlafaxine XR 37.5 MG 24 hr capsule Commonly known as: Effexor XR Take 1 capsule (37.5 mg total) by mouth daily.        DISPOSITION AND FOLLOW-UP:  [name] was discharged from [facility] in [place] condition. At the hospital follow up visit please  address:  Follow-up Recommendations: Consults: None Labs:  CBC, RFP Studies: Will need to reschedule outpatient MRI's. Medications: No new medications  Please monitor R great toe for drainage or evidence of infection. -Recommend warm soaks 10-15 minutes tid followed by mupirocin ointment to the medial nail edge for 7 days.   Follow-up Appointments:  Contact information for after-discharge care     Destination     [facility] .   Service: Skilled Nursing Contact information: [address] [place] [address] [phone]                     HOSPITAL COURSE:  Patient Summary: Phillip Young is a 38 y.o. 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 He had no complications during his stay and will go to rehab facility to continue to work on strength.   SDOH needs: transportation to HD sessions Functional deconditioning BMI 47 Patient previously covered through [facility] now transitioned to [facility], however, unable to open a case to arrange transportation.  Per case manager, patient will be transition back to [employer] but this will not take effect until the beginning of next month.  He does have transportation through [employer] with current insurance.    ESRD on HD MWF Chronic hypotension ESRD anemia Improvement in patient's volume status, close to estimated dry weight. Now transitioned back to MWF schedule.  - Continue midodrine 30 mg TID - Vit D supplementation   Chronic hypoxic respiratory failure Suspected secondary to amiodarone toxicity Home  supplemental O2, 2-4L. Currently on 5L. Respiratory status improving. With dialysis, volume optimization. - Wean with goal O2 sat >90%   AoCHRF HFrEF [EF 30-35%]  Last EF 30-35% in October 2024 with moderately decreased LV function and global hypokinsas. GDMT limited due to hypotension. Volume improving with HD. No crackles, improving orthopnea - Continue with HD - Continue with Digoxin   Chronic atrial fibrillation - On digoxin 0.125 mg three times weekly - Patient is allergic to multiple therapies.  - Continue on Eliquis BID   Chronic somatic and neuropathic pain Chronic opiate therapy - Scheduled Tylenol 100 mg QID  - Venlafaxine 37.5mg  daily  - Lyrica 50mg  daily  - Dilaudid 4mg  q6hrs PRN, will transition back to Oxycodone outpatient soon.    Sacral decubitus ulcer, stage IV - Foam dressing needs to be in place over sacral area.  - RD consult pending.   R great toe wound, c/f ingrown toenail Noted on 09/06/2023. Wrapping in place. No signs of infection or purulent drainage. Recommend warm soaks 10-15 minutes tid followed by mupirocin ointment for 7 days.  Hidradenitis Suppurativa  No active treatment and continues to endorse pain, which is exacerbated by limited mobility in bed - Tylenol 1000 mg q6hrs PRN  - Triamcinolone ointment over left lateral thigh  - Cover with foam dressing.    Hypothyroidism TSH and FT4 within range - Continue Levothyroxine 25 mcg daily   DISCHARGE INSTRUCTIONS:    SUBJECTIVE:  Doing well today. Left dialysis early so as not to miss dinner. Some bleeding from toe overnight with dressing in place. Otherwise continues to feel better.  Discharge Vitals:   BP 116/88   Pulse 82   Temp 98.5 F (36.9 C) (Oral)   Resp 18   Ht 6' (1.829 m)   Wt (!) 164.6 kg   SpO2 100%   BMI 49.21 kg/m   OBJECTIVE:   Constitutional: In no acute distress Cardiovascular:irregular rate and rhythm Pulmonary/Chest: Clear to auscultation bilaterally. No  appreciable crackles.  Abdominal: soft, non-tender MSK: Anterior R chest with HD cath in place without surrounding erythema. Some serosanguinous drainage around the medial edge of the R large toe. Neurological: alert & oriented  Skin: warm and dry Psych: Pleasant mood and affect   Pertinent Labs, Studies, and Procedures:     Latest Ref Rng & Units 09/05/2023    3:43 AM 09/03/2023    4:57 AM 09/02/2023    1:34 AM  CBC  WBC 4.0 - 10.5 K/uL 7.7  7.9  7.9   Hemoglobin 13.0 - 17.0 g/dL 99.3  9.8  9.9   Hematocrit 39.0 - 52.0 % 30.8  30.2  30.3   Platelets 150 - 400 K/uL 203  225  206        Latest Ref Rng & Units 09/06/2023    3:35 AM 09/05/2023    3:43 AM 09/04/2023    1:56 AM  CMP  Glucose 70 - 99 mg/dL 87  716  967   BUN 6 - 20 mg/dL 51  60  46   Creatinine 0.61 - 1.24 mg/dL 8.93  8.10  1.75   Sodium 135 - 145 mmol/L 137  137  136   Potassium 3.5 - 5.1 mmol/L 4.6  5.0  3.8   Chloride 98 - 111 mmol/L 95  97  96   CO2 22 - 32 mmol/L 24  24  23    Calcium 8.9 - 10.3 mg/dL 10.2  58.5  9.5     DG Chest Portable 1 View Result Date: 08/30/2023 CLINICAL DATA:  Shortness of breath EXAM: PORTABLE CHEST 1 VIEW COMPARISON:  07/01/2023 FINDINGS: Right central venous catheter with tip over the mid SVC region. No pneumothorax. Cardiac enlargement. No vascular congestion, edema, or consolidation. No pleural effusion or pneumothorax. Mediastinal contours appear intact. IMPRESSION: Prominent cardiac enlargement similar to prior study. Lungs are clear. Electronically Signed   By: Burman Nieves M.D.   On: 08/30/2023 01:43     Signed: Lovie Macadamia MD Internal Medicine Resident, PGY-1 [facility]  Pager: [phone] 1:44 PM, 09/06/2023

## 2023-09-06 NOTE — Plan of Care (Signed)
Problem: Coping: Goal: Ability to adjust to condition or change in health will improve 09/06/2023 1609 by Genevie Ann, RN Outcome: Adequate for Discharge 09/06/2023 1154 by Genevie Ann, RN Outcome: Progressing 09/06/2023 1153 by Genevie Ann, RN Outcome: Progressing   Problem: Fluid Volume: Goal: Ability to maintain a balanced intake and output will improve 09/06/2023 1609 by Genevie Ann, RN Outcome: Adequate for Discharge 09/06/2023 1153 by Genevie Ann, RN Outcome: Progressing   Problem: Health Behavior/Discharge Planning: Goal: Ability to identify and utilize available resources and services will improve Outcome: Adequate for Discharge Goal: Ability to manage health-related needs will improve Outcome: Adequate for Discharge   Problem: Metabolic: Goal: Ability to maintain appropriate glucose levels will improve Outcome: Adequate for Discharge   Problem: Nutritional: Goal: Maintenance of adequate nutrition will improve Outcome: Adequate for Discharge Goal: Progress toward achieving an optimal weight will improve Outcome: Adequate for Discharge   Problem: Skin Integrity: Goal: Risk for impaired skin integrity will decrease Outcome: Adequate for Discharge   Problem: Tissue Perfusion: Goal: Adequacy of tissue perfusion will improve Outcome: Adequate for Discharge   Problem: Education: Goal: Knowledge of General Education information will improve Description: Including pain rating scale, medication(s)/side effects and non-pharmacologic comfort measures Outcome: Adequate for Discharge   Problem: Health Behavior/Discharge Planning: Goal: Ability to manage health-related needs will improve Outcome: Adequate for Discharge   Problem: Clinical Measurements: Goal: Ability to maintain clinical measurements within normal limits will improve Outcome: Adequate for Discharge Goal: Will remain free from infection Outcome: Adequate for Discharge Goal:  Diagnostic test results will improve Outcome: Adequate for Discharge Goal: Respiratory complications will improve Outcome: Adequate for Discharge Goal: Cardiovascular complication will be avoided Outcome: Adequate for Discharge   Problem: Activity: Goal: Risk for activity intolerance will decrease Outcome: Adequate for Discharge   Problem: Nutrition: Goal: Adequate nutrition will be maintained Outcome: Adequate for Discharge   Problem: Coping: Goal: Level of anxiety will decrease Outcome: Adequate for Discharge   Problem: Elimination: Goal: Will not experience complications related to bowel motility Outcome: Adequate for Discharge Goal: Will not experience complications related to urinary retention Outcome: Adequate for Discharge   Problem: Pain Managment: Goal: General experience of comfort will improve and/or be controlled Outcome: Adequate for Discharge   Problem: Safety: Goal: Ability to remain free from injury will improve Outcome: Adequate for Discharge   Problem: Skin Integrity: Goal: Risk for impaired skin integrity will decrease Outcome: Adequate for Discharge   Problem: Increased Nutrient Needs (NI-5.1) Goal: Food and/or nutrient delivery Description: Individualized approach for food/nutrient provision. Outcome: Adequate for Discharge   Problem: Acute Rehab PT Goals(only PT should resolve) Goal: Pt Will Go Supine/Side To Sit Outcome: Adequate for Discharge Goal: Pt Will Go Sit To Supine/Side Outcome: Adequate for Discharge Goal: Patient Will Transfer Sit To/From Stand Outcome: Adequate for Discharge Goal: Pt Will Transfer Bed To Chair/Chair To Bed Outcome: Adequate for Discharge Goal: Pt Will Ambulate Outcome: Adequate for Discharge Goal: Pt Will Go Up/Down Stairs Outcome: Adequate for Discharge Goal: PT Additional Goal #1 Outcome: Adequate for Discharge   Problem: Education: Goal: Knowledge of disease and its progression will improve Outcome:  Adequate for Discharge Goal: Individualized Educational Video(s) Outcome: Adequate for Discharge   Problem: Fluid Volume: Goal: Compliance with measures to maintain balanced fluid volume will improve Outcome: Adequate for Discharge   Problem: Health Behavior/Discharge Planning: Goal: Ability to manage health-related needs will improve Outcome: Adequate for Discharge   Problem: Nutritional: Goal: Ability to make healthy  dietary choices will improve Outcome: Adequate for Discharge   Problem: Clinical Measurements: Goal: Complications related to the disease process, condition or treatment will be avoided or minimized Outcome: Adequate for Discharge

## 2023-09-06 NOTE — Progress Notes (Signed)
Rt great toe bled minimally.(Toe nail) Dressing applied. Will let MD to see it in am.

## 2023-09-06 NOTE — Discharge Summary (Deleted)
Name: Phillip Young MRN: [id number] DOB: [birth year] 38 y.o. PCP: [name], DO  Date of Admission: 08/30/2023 12:56 AM Date of Discharge:  09/06/2023 Attending Physician: Dr.  Sol Blazing  DISCHARGE DIAGNOSIS:  Primary Problem: ESRD (end stage renal disease) on dialysis [facility])   Hospital Problems: Principal Problem:   ESRD (end stage renal disease) on dialysis ([name]) Active Problems:   OSA (obstructive sleep apnea)   Hidradenitis suppurativa   Chronic systolic CHF (congestive heart failure) ([name])   Chronic respiratory failure with hypoxia ([name])   SOB (shortness of breath)   Paroxysmal atrial flutter ([name])   Hypotension   Pressure injury of skin   Neuropathy   Weakness of right foot   Encounter for screening involving social determinants of health (SDoH)    DISCHARGE MEDICATIONS:   Allergies as of 09/06/2023       Reactions   Amiodarone Other (See Comments)   Suspicion for amiodarone lung/hepatotoxicity   Coreg [carvedilol] Shortness Of Breath, Diarrhea   Wheezing    Heparin Other (See Comments)   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   apixaban 5 MG Tabs tablet Commonly known as: ELIQUIS Take 1 tablet (5 mg total) by mouth 2 (two) times daily.   cholecalciferol 10 MCG (400 UNIT) Tabs tablet Commonly known as: VITAMIN D3 Take 1 tablet (400 Units total) by mouth daily.    cinacalcet 30 MG tablet Commonly known as: SENSIPAR Take 1 tablet (30 mg total) by mouth Every Tuesday,Thursday,and Saturday with dialysis. What changed:  medication strength when to take this   Darbepoetin Alfa 150 MCG/0.3ML Sosy injection Commonly known as: ARANESP To be given during dialysis by [facility] per Nephrology. Not to be given to patient at SNF. What changed:  how much to take how to take this when to take this additional instructions   digoxin 0.125 MG tablet Commonly known as: LANOXIN Take 1 tablet (0.125 mg total) by mouth 3 (three) times a week. Monday, Wednesday, Friday (dialysis days)   Gerhardt's butt cream Crea Apply 1 Application topically 3 (three) times daily.   HYDROmorphone 4 MG tablet Commonly known as: DILAUDID Take 4 mg by mouth every 6 (six) hours as needed for severe pain (pain score 7-10).   levothyroxine 25 MCG tablet Commonly known as: SYNTHROID Take 1 tablet (25 mcg total) by mouth daily at 6 (six) AM.   lidocaine 5 % ointment Commonly known as: XYLOCAINE Apply 1 Application topically as needed. What changed: reasons to take this   midodrine 10 MG tablet Commonly known as: PROAMATINE Take 3 tablets (30 mg total) by mouth 3 (three) times daily with meals.   mupirocin cream 2 % Commonly known as: BACTROBAN Apply topically 2 (two) times daily for 7 days.   pregabalin 50 MG capsule Commonly  known as: LYRICA Take 1 capsule (50 mg total) by mouth daily. Take one capsule daily.   sevelamer carbonate 800 MG tablet Commonly known as: RENVELA Take 2 tablets (1,600 mg total) by mouth 3 (three) times daily with meals.   tirzepatide 2.5 MG/0.5ML injection vial Commonly known as: ZEPBOUND Inject 2.5 mg into the skin once a week.   venlafaxine XR 37.5 MG 24 hr capsule Commonly known as: Effexor XR Take 1 capsule (37.5 mg total) by mouth daily.               Discharge Care Instructions  (From admission, onward)            Start     Ordered   09/06/23 0000  Discharge wound care:       Comments: Warm soaks 10-15 min tid, then apply mupirocin 2% to the right large toe for one week   09/06/23 1352            DISPOSITION AND FOLLOW-UP:  Phillip Young was discharged from [facility] in [place] condition. At the hospital follow up visit please address:  Follow-up Recommendations: Phillip Young was discharged from [facility] in [place] condition. At the hospital follow up visit please address:   Follow-up Recommendations: Consults: None Labs:  CBC, RFP Studies: Will need to reschedule outpatient MRI's. Medications: No new medications   Please monitor R great toe for drainage or evidence of infection. -Recommend warm soaks 10-15 minutes tid followed by mupirocin ointment to the medial nail edge for 7 days.   Follow-up Appointments:  Contact information for follow-up providers     [facility]. Go on 09/07/2023.   Why: Schedule is Monday, Wednesday, Friday with 11:55 am chair time. Please send hoyer pad with pt to HD appointments. Contact information: [address] [place] [address] [phone]              Contact information for after-discharge care     Destination     [facility] .   Service: Skilled Nursing Contact information: [address] [place] [address] [phone]                     HOSPITAL COURSE:  Patient Summary: Phillip Young is a 38 y.o. 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 He had no  complications during his stay and will go to rehab facility to continue to work on strength.   SDOH needs: transportation to HD sessions Functional deconditioning BMI 47 Patient previously covered through [facility] now transitioned to [facility], however, unable to open a case to arrange transportation.  Per case manager, patient will be transition back to [employer] but this will not take effect until the beginning of next month.  He does have transportation through [employer].    ESRD on HD MWF Chronic hypotension ESRD anemia Improvement in patient's volume status, close to estimated dry weight. Now transitioned back to MWF schedule.  - Continue midodrine 30 mg TID - Vit D supplementation   Chronic hypoxic respiratory failure Suspected secondary to amiodarone toxicity Home supplemental O2, 2-4L. Currently on 5L. Respiratory status improving. With  dialysis, volume optimization. - Wean with goal O2 sat >90%   AoCHRF HFrEF [EF 30-35%]  Last EF 30-35% in October 2024 with moderately decreased LV function and global hypokinsas. GDMT limited due to hypotension. Volume improving with HD. No crackles, improving orthopnea - Continue with HD - Continue with Digoxin   Chronic atrial fibrillation - On digoxin 0.125 mg three times weekly - Patient is allergic to multiple therapies.  - Continue on Eliquis BID   Chronic somatic and neuropathic pain Chronic opiate therapy - Scheduled Tylenol 100 mg QID  - Venlafaxine 37.5mg  daily  - Lyrica 50mg  daily  - Dilaudid 4mg  q6hrs PRN, will transition back to Oxycodone outpatient soon.    Sacral decubitus ulcer, stage IV - Foam dressing needs to be in place over sacral area.  - RD consult pending.   Big Toe Wound Noted on 09/06/2023. Wrapping in place. No signs of infection or purulent drainage. Will apply foot care and abx ointment.   Hidradenitis Suppurativa  No active treatment and continues to endorse pain, which is  exacerbated by limited mobility in bed - Tylenol 1000 mg q6hrs PRN  - Triamcinolone ointment over left lateral thigh  - Cover with foam dressing.    Hypothyroidism TSH and FT4 within range - Continue Levothyroxine 25 mcg daily   DISCHARGE INSTRUCTIONS:   Discharge Instructions     Call MD for:  difficulty breathing, headache or visual disturbances   Complete by: As directed    Call MD for:  extreme fatigue   Complete by: As directed    Call MD for:  persistant dizziness or light-headedness   Complete by: As directed    Call MD for:  persistant nausea and vomiting   Complete by: As directed    Call MD for:  redness, tenderness, or signs of infection (pain, swelling, redness, odor or green/yellow discharge around incision site)   Complete by: As directed    Call MD for:  severe uncontrolled pain   Complete by: As directed    Call MD for:  temperature >100.4   Complete by: As directed    Discharge wound care:   Complete by: As directed    Warm soaks 10-15 min tid, then apply mupirocin 2% to the right large toe for one week       SUBJECTIVE:  Doing well today. Left dialysis early so as not to miss dinner. Some bleeding from toe overnight with dressing in place. Otherwise continues to feel better.    Discharge Vitals:   BP 116/88   Pulse 82   Temp 98.5 F (36.9 C) (Oral)   Resp 18   Ht 6' (1.829 m)   Wt (!) 164.6 kg   SpO2 100%   BMI 49.21 kg/m   OBJECTIVE:  Constitutional: In no acute distress Cardiovascular:irregular rate and rhythm Pulmonary/Chest: Clear to auscultation bilaterally. No appreciable crackles.  Abdominal: soft, non-tender MSK: Anterior R chest with HD cath in place without surrounding erythema. Some serosanguinous drainage around the medial edge of the R large toe. Neurological: alert & oriented  Skin: warm and dry Psych: Pleasant mood and affect   Pertinent Labs, Studies, and Procedures:     Latest Ref Rng & Units 09/05/2023    3:43 AM  09/03/2023    4:57 AM 09/02/2023    1:34 AM  CBC  WBC 4.0 - 10.5 K/uL 7.7  7.9  7.9   Hemoglobin 13.0 - 17.0 g/dL 16.1  9.8  9.9   Hematocrit 39.0 -  52.0 % 30.8  30.2  30.3   Platelets 150 - 400 K/uL 203  225  206        Latest Ref Rng & Units 09/06/2023    3:35 AM 09/05/2023    3:43 AM 09/04/2023    1:56 AM  CMP  Glucose 70 - 99 mg/dL 87  355  732   BUN 6 - 20 mg/dL 51  60  46   Creatinine 0.61 - 1.24 mg/dL 2.02  5.42  7.06   Sodium 135 - 145 mmol/L 137  137  136   Potassium 3.5 - 5.1 mmol/L 4.6  5.0  3.8   Chloride 98 - 111 mmol/L 95  97  96   CO2 22 - 32 mmol/L 24  24  23    Calcium 8.9 - 10.3 mg/dL 23.7  62.8  9.5     DG Chest Portable 1 View Result Date: 08/30/2023 CLINICAL DATA:  Shortness of breath EXAM: PORTABLE CHEST 1 VIEW COMPARISON:  07/01/2023 FINDINGS: Right central venous catheter with tip over the mid SVC region. No pneumothorax. Cardiac enlargement. No vascular congestion, edema, or consolidation. No pleural effusion or pneumothorax. Mediastinal contours appear intact. IMPRESSION: Prominent cardiac enlargement similar to prior study. Lungs are clear. Electronically Signed   By: Burman Nieves M.D.   On: 08/30/2023 01:43     Signed: Lovie Macadamia MD Internal Medicine Resident, PGY-1 [facility]  Pager: [phone] 4:54 PM, 09/06/2023

## 2023-09-06 NOTE — Plan of Care (Signed)
Problem: Coping: Goal: Ability to adjust to condition or change in health will improve Outcome: Progressing   Problem: Fluid Volume: Goal: Ability to maintain a balanced intake and output will improve Outcome: Progressing

## 2023-09-06 NOTE — Progress Notes (Signed)
D/C order noted. Contacted [facility] to be advised of pt's d/c to snf today and that pt should resume care tomorrow. Clinic provided snf name and HD appts added to pt's AVS.   Olivia Canter Renal Navigator [phone]

## 2023-09-06 NOTE — TOC Progression Note (Signed)
Transition of Care [facility]) - Progression Note    Patient Details  Name: Phillip Young MRN: [id number] Date of Birth: April 19, 1986  Transition of [facility]) CM/SW Contact  Carley Hammed, LCSW Phone Number: 09/06/2023, 1:21 PM  Clinical Narrative:    CSW met with pt at bedside and mother was on phone. Bed offers provided. [name] mother noted they would prefer to take pt home, but transportation to HD and appointments was a problem. CSW spoke with CM who stated she had worked with pt and family extensively to try to set up for transport. Pt and family have decided to pursue SNF at [facility]. W[facility] Less[facility] can offer a bed today, family to take bipap to facility. CSW requested DC from medical team.         Expected Discharge Plan and Services                                               Social Determinants of Health (SDOH) Interventions SDOH Screenings   Food Insecurity: No Food Insecurity (08/30/2023)  Housing: Low Risk  (08/30/2023)  Transportation Needs: Unmet Transportation Needs (08/30/2023)  Utilities: Not At Risk (08/30/2023)  Alcohol Screen: Low Risk  (09/21/2022)  Depression (PHQ2-9): High Risk (06/21/2023)  Financial Resource Strain: Low Risk  (03/24/2023)   Received from [facility]  Physical Activity: Inactive (09/21/2022)  Social Connections: Moderately Isolated (03/24/2023)   Received from [facility]  Stress: No Stress Concern Present (04/21/2023)   Received from [facility]  Tobacco Use: Medium Risk (08/30/2023)    Readmission Risk Interventions    07/05/2023    4:25 PM 05/24/2023    4:43 PM 03/23/2023    3:48 PM  Readmission Risk Prevention Plan  Transportation Screening Complete Complete Complete  PCP or Specialist Appt within 5-7 Days  Complete   Home Care Screening  Complete   Medication Review (RN CM)  Referral to Pharmacy   HRI or Home Care Consult Complete    Social Work Consult for Recovery Care Planning/Counseling Complete    Palliative Care  Screening Not Applicable    Medication Review Oceanographer) Complete  Complete  PCP or Specialist appointment within 3-5 days of discharge   --  HRI or Home Care Consult   Complete  SW Recovery Care/Counseling Consult   Complete  Palliative Care Screening   Not Applicable  Skilled Nursing Facility   Not Applicable

## 2023-09-06 NOTE — TOC Transition Note (Signed)
Transition of Care [facility]) - Discharge Note   Patient Details  Name: Phillip Young MRN: [id number] Date of Birth: May 23, 1986  Transition of Care [facility]) CM/SW Contact:  Carley Hammed, LCSW Phone Number: 09/06/2023, 1:35 PM   Clinical Narrative:    Pt to be transported to [place] after 4 PM via [facility]. Nurse to call report to [phone]. Rm# [phone] C. Mother arranging for Bipap to be brought to facility.    Final next level of care: Skilled Nursing Facility Barriers to Discharge: Barriers Resolved   Patient Goals and CMS Choice            Discharge Placement              Patient chooses bed at:  [facility]) Patient to be transferred to facility by: [facility] Name of family member notified: [name] Patient and family notified of of transfer: 09/06/23  Discharge Plan and Services Additional resources added to the After Visit Summary for                                       Social Drivers of Health (SDOH) Interventions SDOH Screenings   Food Insecurity: No Food Insecurity (08/30/2023)  Housing: Low Risk  (08/30/2023)  Transportation Needs: Unmet Transportation Needs (08/30/2023)  Utilities: Not At Risk (08/30/2023)  Alcohol Screen: Low Risk  (09/21/2022)  Depression (PHQ2-9): High Risk (06/21/2023)  Financial Resource Strain: Low Risk  (03/24/2023)   Received from [facility]  Physical Activity: Inactive (09/21/2022)  Social Connections: Moderately Isolated (03/24/2023)   Received from [facility]  Stress: No Stress Concern Present (04/21/2023)   Received from [facility]  Tobacco Use: Medium Risk (08/30/2023)     Readmission Risk Interventions    07/05/2023    4:25 PM 05/24/2023    4:43 PM 03/23/2023    3:48 PM  Readmission Risk Prevention Plan  Transportation Screening Complete Complete Complete  PCP or Specialist Appt within 5-7 Days  Complete   Home Care Screening  Complete   Medication Review (RN CM)  Referral to Pharmacy   HRI or Home Care  Consult Complete    Social Work Consult for Recovery Care Planning/Counseling Complete    Palliative Care Screening Not Applicable    Medication Review Oceanographer) Complete  Complete  PCP or Specialist appointment within 3-5 days of discharge   --  HRI or Home Care Consult   Complete  SW Recovery Care/Counseling Consult   Complete  Palliative Care Screening   Not Applicable  Skilled Nursing Facility   Not Applicable

## 2023-09-06 NOTE — Progress Notes (Signed)
[facility] Progress Note   Subjective:    Seen and examined patient at bedside. Noted patient signed off early yesterday. Noted net UF 4.5L. He expresses feeling depressed about going to a SNF which appears to be the updated plan. Next HD 2/12.  Objective Vitals:   09/05/23 1933 09/05/23 2010 09/06/23 0624 09/06/23 0838  BP:  117/71 127/67 116/88  Pulse:  99 95 82  Resp:  20 18 18   Temp:  98 F (36.7 C) 98.4 F (36.9 C) 98.5 F (36.9 C)  TempSrc:  Oral  Oral  SpO2:  100% 100% 100%  Weight: (!) 164.6 kg     Height:       Physical Exam General: Obese chronically ill male, NAD  Heart: Irreg irreg. No mrg, gallops, or rubs Lungs: CTA  ant  , nonlabored on 5 l [place] O2 O2 sat 100% Abdomen: Obese, NABS ,  soft, NT, ND abd wall edema  Extremities: Bilat  2+ pedal edema  Dialysis Access: R  internal jugular [facility]  Filed Weights   09/01/23 1255 09/05/23 1515 09/05/23 1933  Weight: (!) 157.9 kg (!) 169.1 kg (!) 164.6 kg    Intake/Output Summary (Last 24 hours) at 09/06/2023 1110 Last data filed at 09/06/2023 0655 Gross per 24 hour  Intake 365 ml  Output 4500 ml  Net -4135 ml    Additional Objective Labs: Basic Metabolic Panel: Recent Labs  Lab 09/04/23 0156 09/05/23 0343 09/06/23 0335  NA 136 137 137  K 3.8 5.0 4.6  CL 96* 97* 95*  CO2 23 24 24   GLUCOSE 101* 107* 87  BUN 46* 60* 51*  CREATININE 8.06* 9.53* 7.36*  CALCIUM 9.5 10.6* 10.3  PHOS 5.1* 5.9* 5.2*   Liver Function Tests: Recent Labs  Lab 09/04/23 0156 09/05/23 0343 09/06/23 0335  ALBUMIN 3.2* 3.3* 3.4*   No results for input(s): "LIPASE", "AMYLASE" in the last 168 hours. CBC: Recent Labs  Lab 08/31/23 0348 09/01/23 0343 09/02/23 0134 09/03/23 0457 09/05/23 0343  WBC 8.5 6.8 7.9 7.9 7.7  NEUTROABS  --   --   --  5.7  --   HGB 10.3* 9.9* 9.9* 9.8* 10.1*  HCT 31.2* 29.7* 30.3* 30.2* 30.8*  MCV 100.6* 101.0* 102.4* 103.1* 103.7*  PLT 242 222 206 225 203   Blood Culture     Component Value Date/Time   SDES BLOOD BLOOD RIGHT HAND 05/27/2023 0959   SPECREQUEST  05/27/2023 0959    BOTTLES DRAWN AEROBIC AND ANAEROBIC Blood Culture adequate volume   CULT  05/27/2023 0959    NO GROWTH 5 DAYS Performed at [facility], [address] [place], [place] [address]    REPTSTATUS 06/01/2023 FINAL 05/27/2023 1914    Cardiac Enzymes: No results for input(s): "CKTOTAL", "CKMB", "CKMBINDEX", "TROPONINI" in the last 168 hours. CBG: Recent Labs  Lab 09/04/23 1957 09/05/23 0818 09/05/23 1227 09/05/23 2009 09/06/23 0723  GLUCAP 110* 105* 102* 104* 107*   Iron Studies: No results for input(s): "IRON", "TIBC", "TRANSFERRIN", "FERRITIN" in the last 72 hours. Lab Results  Component Value Date   INR 2.0 (H) 06/10/2022   INR 2.8 (H) 06/09/2022   INR 2.9 (H) 06/07/2022   Studies/Results: No results found.  Medications:  anticoagulant sodium citrate      acetaminophen  1,000 mg Oral Q6H   apixaban  5 mg Oral BID   Chlorhexidine Gluconate Cloth  6 each Topical Q0600   cholecalciferol  400 Units Oral Daily   cinacalcet  30 mg Oral Q T,Th,Sa-HD   Darbepoetin Alfa  150 mcg Subcutaneous Q Sat-1800   digoxin  0.125 mg Oral Once per day on Monday Wednesday Friday   insulin aspart  0-5 Units Subcutaneous QHS   insulin aspart  0-6 Units Subcutaneous TID WC   levothyroxine  25 mcg Oral Q0600   lidocaine  1 patch Transdermal Q24H   midodrine  30 mg Oral TID WC   pregabalin  50 mg Oral Daily   sevelamer carbonate  1,600 mg Oral TID WC   triamcinolone ointment   Topical BID   venlafaxine XR  37.5 mg Oral Daily    Dialysis Orders:  [facility] MWF From dec 2024 -->  4.5h   2/2 bath  155.5kg  RIJ TDC  Heparin none Micera every 2 weeks - last dose 08/24/23 Venofer 100mg  load - last dose 08/24/23. Last dose scheduled for 09/02/23   Renal-related home meds: - sensipar 30 every day - fosrenol 1 gm ac tid - midodrine 30 tid - others: effexor xr, lyrica, T4, digoxin,  eliquis  Assessment/Plan: Acute on chronic hypoxic and hypercarbic resp failure -  due to vol overload from missed HD.  Slowly improving with HD UF, is on home O2 "varies he tells me  3 to 5 L ." ESRD - on HD MWF. He signed off early 2/8 and again yesterday. Ongoing discussion on HD compliance. Next HD 2/12. Chronic hypotension - on high dose midodrine 30 TID. Volume HFrEDF - EF of 30-35% in oct 2024/- as above has sig LE edema from missed HD. Appears he's getting closer to his EDW per weights here. Continue to push UF as tolerated. Anemia of esrd - Hb 9.8 . ESA last given on 1/29, not due yet. Secondary hyperparathyroidism - CCa 10.2 and phos is trending down. Cont sensipar. He reports not tolerating Fosrenol (medication makes him sick) so now on Renvela while here. Atrial fib - on digoxin, eliquis Chronic resp failure - felt to be due to amio lung toxicity and 1. Nutrition - noted he was eating outside food  past week  (foot-long sub). Discussed again on following renal diet and fluid restriction. Dispo - Appears plan is for SNF placement. We appreciate SW/TOC's assistance.Salome Holmes, NP [facility] 09/06/2023,11:10 AM  LOS: 7 days

## 2023-09-06 NOTE — Discharge Summary (Cosign Needed)
Name: Phillip Young MRN: [id number] DOB: 06/19/1986 38 y.o. PCP: [name], DO  Date of Admission: 08/30/2023 12:56 AM Date of Discharge:  09/06/2023 Attending Physician: Dr.  Sol Blazing  DISCHARGE DIAGNOSIS:  Primary Problem: ESRD (end stage renal disease) on dialysis [facility])   Hospital Problems: Principal Problem:   ESRD (end stage renal disease) on dialysis ([name]) Active Problems:   OSA (obstructive sleep apnea)   Hidradenitis suppurativa   Chronic systolic CHF (congestive heart failure) ([name])   Chronic respiratory failure with hypoxia ([name])   SOB (shortness of breath)   Paroxysmal atrial flutter ([name])   Hypotension   Pressure injury of skin   Neuropathy   Weakness of right foot   Encounter for screening involving social determinants of health (SDoH)    DISCHARGE MEDICATIONS:   Allergies as of 09/06/2023       Reactions   Amiodarone Other (See Comments)   Suspicion for amiodarone lung/hepatotoxicity   Coreg [carvedilol] Shortness Of Breath, Diarrhea   Wheezing    Heparin Other (See Comments)   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   apixaban 5 MG Tabs tablet Commonly known as: ELIQUIS Take 1 tablet (5 mg total) by mouth 2 (two) times daily.   cholecalciferol 10 MCG (400 UNIT) Tabs tablet Commonly known as: VITAMIN D3 Take 1 tablet (400 Units total) by mouth daily.    cinacalcet 30 MG tablet Commonly known as: SENSIPAR Take 1 tablet (30 mg total) by mouth Every Tuesday,Thursday,and Saturday with dialysis. What changed:  medication strength when to take this   Darbepoetin Alfa 150 MCG/0.3ML Sosy injection Commonly known as: ARANESP To be given during dialysis by [facility] per Nephrology. Not to be given to patient at SNF. What changed:  how much to take how to take this when to take this additional instructions   digoxin 0.125 MG tablet Commonly known as: LANOXIN Take 1 tablet (0.125 mg total) by mouth 3 (three) times a week. Monday, Wednesday, Friday (dialysis days)   Gerhardt's butt cream Crea Apply 1 Application topically 3 (three) times daily.   HYDROmorphone 4 MG tablet Commonly known as: DILAUDID Take 4 mg by mouth every 6 (six) hours as needed for severe pain (pain score 7-10).   levothyroxine 25 MCG tablet Commonly known as: SYNTHROID Take 1 tablet (25 mcg total) by mouth daily at 6 (six) AM.   lidocaine 5 % ointment Commonly known as: XYLOCAINE Apply 1 Application topically as needed. What changed: reasons to take this   midodrine 10 MG tablet Commonly known as: PROAMATINE Take 3 tablets (30 mg total) by mouth 3 (three) times daily with meals.   mupirocin cream 2 % Commonly known as: BACTROBAN Apply topically 2 (two) times daily for 7 days.   pregabalin 50 MG capsule Commonly  known as: LYRICA Take 1 capsule (50 mg total) by mouth daily. Take one capsule daily.   sevelamer carbonate 800 MG tablet Commonly known as: RENVELA Take 2 tablets (1,600 mg total) by mouth 3 (three) times daily with meals.   tirzepatide 2.5 MG/0.5ML injection vial Commonly known as: ZEPBOUND Inject 2.5 mg into the skin once a week.   venlafaxine XR 37.5 MG 24 hr capsule Commonly known as: Effexor XR Take 1 capsule (37.5 mg total) by mouth daily.               Discharge Care Instructions  (From admission, onward)            Start     Ordered   09/06/23 0000  Discharge wound care:       Comments: Warm soaks 10-15 min tid, then apply mupirocin 2% to the right large toe for one week   09/06/23 1352            DISPOSITION AND FOLLOW-UP:  Mr.Jedediah Schoenfeldt was discharged from [facility] in [place] condition. At the hospital follow up visit please address:  Follow-up Recommendations: Consults: None Labs:  CBC, RFP Studies: Will need to reschedule outpatient MRI's. Medications: No new medications   Please monitor R great toe for drainage or evidence of infection. -Recommend warm soaks 10-15 minutes tid followed by mupirocin ointment to the medial nail edge for 7 days.   Follow-up Appointments:  Contact information for follow-up providers     Center, [facility]. Go on 09/07/2023.   Why: Schedule is Monday, Wednesday, Friday with 11:55 am chair time. Please send hoyer pad with pt to HD appointments. Contact information: [address] [place] [address] [phone]              Contact information for after-discharge care     Destination     [facility] .   Service: Skilled Nursing Contact information: [address] [place] [address] [phone]                     HOSPITAL COURSE:  Patient Summary: Phillip Young is a 38 y.o. 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 He had no complications during his stay and will go to rehab facility to continue to work on strength.   SDOH needs: transportation to HD sessions Functional deconditioning BMI  47 Patient previously covered through [facility] now transitioned to [facility], however, unable to open a case to arrange transportation.  Per case manager, patient will be transition back to [employer] but this will not take effect until the beginning of next month.  He does have transportation through [employer].    ESRD on HD MWF Chronic hypotension ESRD anemia Improvement in patient's volume status, close to estimated dry weight. Now transitioned back to MWF schedule.  - Continue midodrine 30 mg TID - Vit D supplementation   Chronic hypoxic respiratory failure Suspected secondary to amiodarone toxicity Home supplemental O2, 2-4L. Currently on 5L. Respiratory status improving. With dialysis, volume optimization. - Wean with goal O2 sat >90%   AoCHRF HFrEF [EF 30-35%]  Last EF 30-35% in October 2024 with  moderately decreased LV function and global hypokinsas. GDMT limited due to hypotension. Volume improving with HD. No crackles, improving orthopnea - Continue with HD - Continue with Digoxin   Chronic atrial fibrillation - On digoxin 0.125 mg three times weekly - Patient is allergic to multiple therapies.  - Continue on Eliquis BID   Chronic somatic and neuropathic pain Chronic opiate therapy - Scheduled Tylenol 100 mg QID  - Venlafaxine 37.5mg  daily  - Lyrica 50mg  daily  - Dilaudid 4mg  q6hrs PRN, will transition back to Oxycodone outpatient soon.    Sacral decubitus ulcer, stage IV - Foam dressing needs to be in place over sacral area.  - RD consult pending.   Big Toe Wound Noted on 09/06/2023. Wrapping in place. No signs of infection or purulent drainage. Will apply foot care and abx ointment.   Hidradenitis Suppurativa  No active treatment and continues to endorse pain, which is exacerbated by limited mobility in bed - Tylenol 1000 mg q6hrs PRN  - Triamcinolone ointment over left lateral thigh  - Cover with foam dressing.    Hypothyroidism TSH  and FT4 within range - Continue Levothyroxine 25 mcg daily   DISCHARGE INSTRUCTIONS:   Discharge Instructions     Call MD for:  difficulty breathing, headache or visual disturbances   Complete by: As directed    Call MD for:  extreme fatigue   Complete by: As directed    Call MD for:  persistant dizziness or light-headedness   Complete by: As directed    Call MD for:  persistant nausea and vomiting   Complete by: As directed    Call MD for:  redness, tenderness, or signs of infection (pain, swelling, redness, odor or green/yellow discharge around incision site)   Complete by: As directed    Call MD for:  severe uncontrolled pain   Complete by: As directed    Call MD for:  temperature >100.4   Complete by: As directed    Discharge wound care:   Complete by: As directed    Warm soaks 10-15 min tid, then apply mupirocin 2% to the right large toe for one week       SUBJECTIVE:  Doing well today. Left dialysis early so as not to miss dinner. Some bleeding from toe overnight with dressing in place. Otherwise continues to feel better.     Discharge Vitals:   BP 116/88   Pulse 82   Temp 98.5 F (36.9 C) (Oral)   Resp 18   Ht 6' (1.829 m)   Wt (!) 164.6 kg   SpO2 100%   BMI 49.21 kg/m   OBJECTIVE:  Constitutional: In no acute distress Cardiovascular:irregular rate and rhythm Pulmonary/Chest: Clear to auscultation bilaterally. No appreciable crackles.  Abdominal: soft, non-tender MSK: Anterior R chest with HD cath in place without surrounding erythema. Some serosanguinous drainage around the medial edge of the R large toe. Neurological: alert & oriented  Skin: warm and dry Psych: Pleasant mood and affect    Pertinent Labs, Studies, and Procedures:     Latest Ref Rng & Units 09/05/2023    3:43 AM 09/03/2023    4:57 AM 09/02/2023    1:34 AM  CBC  WBC 4.0 - 10.5 K/uL 7.7  7.9  7.9   Hemoglobin 13.0 - 17.0 g/dL 32.4  9.8  9.9   Hematocrit 39.0 - 52.0 % 30.8  30.2  30.3    Platelets 150 - 400 K/uL 203  225  206  Latest Ref Rng & Units 09/06/2023    3:35 AM 09/05/2023    3:43 AM 09/04/2023    1:56 AM  CMP  Glucose 70 - 99 mg/dL 87  161  096   BUN 6 - 20 mg/dL 51  60  46   Creatinine 0.61 - 1.24 mg/dL 0.45  4.09  8.11   Sodium 135 - 145 mmol/L 137  137  136   Potassium 3.5 - 5.1 mmol/L 4.6  5.0  3.8   Chloride 98 - 111 mmol/L 95  97  96   CO2 22 - 32 mmol/L 24  24  23    Calcium 8.9 - 10.3 mg/dL 91.4  78.2  9.5     DG Chest Portable 1 View Result Date: 08/30/2023 CLINICAL DATA:  Shortness of breath EXAM: PORTABLE CHEST 1 VIEW COMPARISON:  07/01/2023 FINDINGS: Right central venous catheter with tip over the mid SVC region. No pneumothorax. Cardiac enlargement. No vascular congestion, edema, or consolidation. No pleural effusion or pneumothorax. Mediastinal contours appear intact. IMPRESSION: Prominent cardiac enlargement similar to prior study. Lungs are clear. Electronically Signed   By: Burman Nieves M.D.   On: 08/30/2023 01:43     Signed: Lovie Macadamia MD Internal Medicine Resident, PGY-1 [facility]  Pager: [phone] 5:00 PM, 09/06/2023

## 2023-09-06 NOTE — Progress Notes (Signed)
Patient discharging to [facility] report called to nurse Alona Bene.  Miko Markwood, Kae Heller, RN

## 2023-09-07 ENCOUNTER — Telehealth: Payer: Self-pay | Admitting: Student

## 2023-09-07 ENCOUNTER — Other Ambulatory Visit: Payer: Self-pay

## 2023-09-07 ENCOUNTER — Emergency Department (HOSPITAL_COMMUNITY): Payer: Medicare Other

## 2023-09-07 ENCOUNTER — Encounter (HOSPITAL_COMMUNITY): Payer: Self-pay | Admitting: Emergency Medicine

## 2023-09-07 ENCOUNTER — Inpatient Hospital Stay (HOSPITAL_COMMUNITY)
Admission: EM | Admit: 2023-09-07 | Discharge: 2023-09-12 | DRG: 291 | Disposition: A | Discharge status: Rehabilitation Facility | Payer: Medicare Other | Attending: Internal Medicine | Admitting: Internal Medicine

## 2023-09-07 ENCOUNTER — Ambulatory Visit: Payer: Medicare Other | Admitting: Licensed Clinical Social Worker

## 2023-09-07 ENCOUNTER — Observation Stay (HOSPITAL_COMMUNITY): Payer: Medicare Other

## 2023-09-07 DIAGNOSIS — I9589 Other hypotension: Secondary | ICD-10-CM | POA: Diagnosis present

## 2023-09-07 DIAGNOSIS — I4821 Permanent atrial fibrillation: Principal | ICD-10-CM | POA: Diagnosis present

## 2023-09-07 DIAGNOSIS — Z8701 Personal history of pneumonia (recurrent): Secondary | ICD-10-CM

## 2023-09-07 DIAGNOSIS — G629 Polyneuropathy, unspecified: Secondary | ICD-10-CM

## 2023-09-07 DIAGNOSIS — Z91158 Patient's noncompliance with renal dialysis for other reason: Secondary | ICD-10-CM

## 2023-09-07 DIAGNOSIS — L03031 Cellulitis of right toe: Secondary | ICD-10-CM | POA: Diagnosis present

## 2023-09-07 DIAGNOSIS — Z87891 Personal history of nicotine dependence: Secondary | ICD-10-CM

## 2023-09-07 DIAGNOSIS — Z8249 Family history of ischemic heart disease and other diseases of the circulatory system: Secondary | ICD-10-CM

## 2023-09-07 DIAGNOSIS — R000 Tachycardia, unspecified: Secondary | ICD-10-CM | POA: Diagnosis present

## 2023-09-07 DIAGNOSIS — I5022 Chronic systolic (congestive) heart failure: Secondary | ICD-10-CM | POA: Diagnosis not present

## 2023-09-07 DIAGNOSIS — F410 Panic disorder [episodic paroxysmal anxiety] without agoraphobia: Secondary | ICD-10-CM | POA: Diagnosis present

## 2023-09-07 DIAGNOSIS — I4891 Unspecified atrial fibrillation: Principal | ICD-10-CM | POA: Diagnosis present

## 2023-09-07 DIAGNOSIS — N186 End stage renal disease: Principal | ICD-10-CM | POA: Diagnosis present

## 2023-09-07 DIAGNOSIS — I132 Hypertensive heart and chronic kidney disease with heart failure and with stage 5 chronic kidney disease, or end stage renal disease: Secondary | ICD-10-CM | POA: Diagnosis not present

## 2023-09-07 DIAGNOSIS — T462X5A Adverse effect of other antidysrhythmic drugs, initial encounter: Secondary | ICD-10-CM | POA: Diagnosis present

## 2023-09-07 DIAGNOSIS — I5082 Biventricular heart failure: Secondary | ICD-10-CM | POA: Diagnosis present

## 2023-09-07 DIAGNOSIS — G894 Chronic pain syndrome: Secondary | ICD-10-CM | POA: Diagnosis present

## 2023-09-07 DIAGNOSIS — E1142 Type 2 diabetes mellitus with diabetic polyneuropathy: Secondary | ICD-10-CM | POA: Diagnosis present

## 2023-09-07 DIAGNOSIS — L89154 Pressure ulcer of sacral region, stage 4: Secondary | ICD-10-CM | POA: Diagnosis present

## 2023-09-07 DIAGNOSIS — Z79890 Hormone replacement therapy (postmenopausal): Secondary | ICD-10-CM

## 2023-09-07 DIAGNOSIS — M79605 Pain in left leg: Secondary | ICD-10-CM | POA: Diagnosis present

## 2023-09-07 DIAGNOSIS — Z6841 Body Mass Index (BMI) 40.0 and over, adult: Secondary | ICD-10-CM

## 2023-09-07 DIAGNOSIS — L600 Ingrowing nail: Secondary | ICD-10-CM | POA: Diagnosis present

## 2023-09-07 DIAGNOSIS — E1122 Type 2 diabetes mellitus with diabetic chronic kidney disease: Secondary | ICD-10-CM | POA: Diagnosis present

## 2023-09-07 DIAGNOSIS — Z5189 Encounter for other specified aftercare: Secondary | ICD-10-CM

## 2023-09-07 DIAGNOSIS — Z888 Allergy status to other drugs, medicaments and biological substances status: Secondary | ICD-10-CM

## 2023-09-07 DIAGNOSIS — Z7901 Long term (current) use of anticoagulants: Secondary | ICD-10-CM

## 2023-09-07 DIAGNOSIS — Z5982 Transportation insecurity: Secondary | ICD-10-CM

## 2023-09-07 DIAGNOSIS — Z79899 Other long term (current) drug therapy: Secondary | ICD-10-CM

## 2023-09-07 DIAGNOSIS — I5043 Acute on chronic combined systolic (congestive) and diastolic (congestive) heart failure: Secondary | ICD-10-CM | POA: Diagnosis present

## 2023-09-07 DIAGNOSIS — E662 Morbid (severe) obesity with alveolar hypoventilation: Secondary | ICD-10-CM | POA: Diagnosis present

## 2023-09-07 DIAGNOSIS — I5084 End stage heart failure: Secondary | ICD-10-CM | POA: Diagnosis present

## 2023-09-07 DIAGNOSIS — I428 Other cardiomyopathies: Secondary | ICD-10-CM | POA: Diagnosis present

## 2023-09-07 DIAGNOSIS — Z7985 Long-term (current) use of injectable non-insulin antidiabetic drugs: Secondary | ICD-10-CM

## 2023-09-07 DIAGNOSIS — M79604 Pain in right leg: Secondary | ICD-10-CM | POA: Diagnosis present

## 2023-09-07 DIAGNOSIS — N2581 Secondary hyperparathyroidism of renal origin: Secondary | ICD-10-CM | POA: Diagnosis present

## 2023-09-07 DIAGNOSIS — R011 Cardiac murmur, unspecified: Secondary | ICD-10-CM | POA: Diagnosis present

## 2023-09-07 DIAGNOSIS — I959 Hypotension, unspecified: Secondary | ICD-10-CM | POA: Diagnosis present

## 2023-09-07 DIAGNOSIS — D631 Anemia in chronic kidney disease: Secondary | ICD-10-CM | POA: Diagnosis present

## 2023-09-07 DIAGNOSIS — Z880 Allergy status to penicillin: Secondary | ICD-10-CM

## 2023-09-07 DIAGNOSIS — G4733 Obstructive sleep apnea (adult) (pediatric): Secondary | ICD-10-CM | POA: Diagnosis present

## 2023-09-07 DIAGNOSIS — I953 Hypotension of hemodialysis: Secondary | ICD-10-CM | POA: Diagnosis present

## 2023-09-07 DIAGNOSIS — E039 Hypothyroidism, unspecified: Secondary | ICD-10-CM | POA: Diagnosis present

## 2023-09-07 DIAGNOSIS — Z79891 Long term (current) use of opiate analgesic: Secondary | ICD-10-CM

## 2023-09-07 DIAGNOSIS — J9611 Chronic respiratory failure with hypoxia: Secondary | ICD-10-CM | POA: Diagnosis present

## 2023-09-07 DIAGNOSIS — L732 Hidradenitis suppurativa: Secondary | ICD-10-CM | POA: Diagnosis present

## 2023-09-07 DIAGNOSIS — K219 Gastro-esophageal reflux disease without esophagitis: Secondary | ICD-10-CM | POA: Diagnosis present

## 2023-09-07 DIAGNOSIS — R0602 Shortness of breath: Secondary | ICD-10-CM | POA: Diagnosis not present

## 2023-09-07 DIAGNOSIS — Z992 Dependence on renal dialysis: Secondary | ICD-10-CM

## 2023-09-07 LAB — COMPREHENSIVE METABOLIC PANEL
ALT: 18 U/L (ref 0–44)
AST: 26 U/L (ref 15–41)
Albumin: 3.3 g/dL — ABNORMAL LOW (ref 3.5–5.0)
Alkaline Phosphatase: 125 U/L (ref 38–126)
Anion gap: 18 — ABNORMAL HIGH (ref 5–15)
BUN: 81 mg/dL — ABNORMAL HIGH (ref 6–20)
CO2: 18 mmol/L — ABNORMAL LOW (ref 22–32)
Calcium: 9.6 mg/dL (ref 8.9–10.3)
Chloride: 98 mmol/L (ref 98–111)
Creatinine, Ser: 9.84 mg/dL — ABNORMAL HIGH (ref 0.61–1.24)
GFR, Estimated: 6 mL/min — ABNORMAL LOW (ref >60)
Glucose, Bld: 112 mg/dL — ABNORMAL HIGH (ref 70–99)
Potassium: 4.9 mmol/L (ref 3.5–5.1)
Sodium: 134 mmol/L — ABNORMAL LOW (ref 135–145)
Total Bilirubin: 1.1 mg/dL (ref 0.0–1.2)
Total Protein: 6.8 g/dL (ref 6.5–8.1)

## 2023-09-07 LAB — CBC WITH DIFFERENTIAL/PLATELET
Abs Immature Granulocytes: 0.04 10*3/uL (ref 0.00–0.07)
Basophils Absolute: 0.1 10*3/uL (ref 0.0–0.1)
Basophils Relative: 1 %
Eosinophils Absolute: 1.1 10*3/uL — ABNORMAL HIGH (ref 0.0–0.5)
Eosinophils Relative: 12 %
HCT: 31.8 % — ABNORMAL LOW (ref 39.0–52.0)
Hemoglobin: 9.6 g/dL — ABNORMAL LOW (ref 13.0–17.0)
Immature Granulocytes: 0 %
Lymphocytes Relative: 12 %
Lymphs Abs: 1.1 10*3/uL (ref 0.7–4.0)
MCH: 33.4 pg (ref 26.0–34.0)
MCHC: 30.2 g/dL (ref 30.0–36.0)
MCV: 110.8 fL — ABNORMAL HIGH (ref 80.0–100.0)
Monocytes Absolute: 0.4 10*3/uL (ref 0.1–1.0)
Monocytes Relative: 5 %
Neutro Abs: 6.4 10*3/uL (ref 1.7–7.7)
Neutrophils Relative %: 70 %
Platelets: 182 10*3/uL (ref 150–400)
RBC: 2.87 MIL/uL — ABNORMAL LOW (ref 4.22–5.81)
RDW: 16.2 % — ABNORMAL HIGH (ref 11.5–15.5)
WBC: 9.2 10*3/uL (ref 4.0–10.5)
nRBC: 0 % (ref 0.0–0.2)

## 2023-09-07 LAB — DIGOXIN LEVEL: Digoxin Level: 0.6 ng/mL — ABNORMAL LOW (ref 0.8–2.0)

## 2023-09-07 LAB — I-STAT CG4 LACTIC ACID, ED: Lactic Acid, Venous: 1.9 mmol/L (ref 0.5–1.9)

## 2023-09-07 MED ORDER — MENTHOL 3 MG MT LOZG
1.0000 | LOZENGE | OROMUCOSAL | Status: DC | PRN
  Filled 2023-09-07: qty 9

## 2023-09-07 MED ORDER — ORAL CARE MOUTH RINSE: 15.0000 mL | OROMUCOSAL | Status: DC | PRN

## 2023-09-07 MED ORDER — APIXABAN 5 MG PO TABS
5.0000 mg | ORAL_TABLET | Freq: Two times a day (BID) | ORAL | Status: DC
  Administered 2023-09-08 – 2023-09-12 (×10): 5 mg via ORAL
  Filled 2023-09-07 (×11): qty 1

## 2023-09-07 MED ORDER — MUPIROCIN 2 % EX OINT
TOPICAL_OINTMENT | Freq: Two times a day (BID) | CUTANEOUS | Status: DC
  Filled 2023-09-07 (×3): qty 22

## 2023-09-07 MED ORDER — ALBUTEROL SULFATE (2.5 MG/3ML) 0.083% IN NEBU: 2.5000 mg | INHALATION_SOLUTION | RESPIRATORY_TRACT | Status: DC | PRN

## 2023-09-07 MED ORDER — HYDROMORPHONE HCL 2 MG PO TABS
4.0000 mg | ORAL_TABLET | Freq: Four times a day (QID) | ORAL | Status: DC | PRN
  Administered 2023-09-08 (×2): 4 mg via ORAL
  Filled 2023-09-07 (×2): qty 2

## 2023-09-07 MED ORDER — MUPIROCIN CALCIUM 2 % EX CREA: TOPICAL_CREAM | Freq: Two times a day (BID) | CUTANEOUS | Status: DC

## 2023-09-07 MED ORDER — DIGOXIN 0.1 MG/ML IJ SOLN: 0.2500 mg | Freq: Once | INTRAMUSCULAR | Status: DC

## 2023-09-07 MED ORDER — ACETAMINOPHEN 500 MG PO TABS
1000.0000 mg | ORAL_TABLET | Freq: Four times a day (QID) | ORAL | Status: DC
Start: 2023-09-08 — End: 2023-09-13
  Administered 2023-09-08 – 2023-09-12 (×17): 1000 mg via ORAL
  Filled 2023-09-07 (×19): qty 2

## 2023-09-07 MED ORDER — DIGOXIN 0.1 MG/ML IJ SOLN
0.1000 mg | Freq: Once | INTRAMUSCULAR | Status: AC
  Administered 2023-09-07: 0.1 mg via INTRAVENOUS
  Filled 2023-09-07: qty 1

## 2023-09-07 MED ORDER — CHOLECALCIFEROL 10 MCG (400 UNIT) PO TABS
400.0000 [IU] | ORAL_TABLET | Freq: Every day | ORAL | Status: DC
  Administered 2023-09-08 – 2023-09-12 (×5): 400 [IU] via ORAL
  Filled 2023-09-07 (×5): qty 1

## 2023-09-07 MED ORDER — PREGABALIN 25 MG PO CAPS
50.0000 mg | ORAL_CAPSULE | Freq: Every day | ORAL | Status: DC
  Administered 2023-09-08 – 2023-09-12 (×5): 50 mg via ORAL
  Filled 2023-09-07 (×5): qty 2

## 2023-09-07 MED ORDER — MIDODRINE HCL 5 MG PO TABS
30.0000 mg | ORAL_TABLET | Freq: Three times a day (TID) | ORAL | Status: DC
  Administered 2023-09-10 – 2023-09-12 (×3): 30 mg via ORAL
  Filled 2023-09-07 (×8): qty 6

## 2023-09-07 MED ORDER — DIGOXIN 125 MCG PO TABS
0.1250 mg | ORAL_TABLET | ORAL | Status: DC
  Administered 2023-09-09 – 2023-09-12 (×2): 0.125 mg via ORAL
  Filled 2023-09-07 (×2): qty 1

## 2023-09-07 MED ORDER — LIDOCAINE 5 % EX OINT: 1.0000 | TOPICAL_OINTMENT | CUTANEOUS | Status: DC | PRN

## 2023-09-07 MED ORDER — LEVOTHYROXINE SODIUM 25 MCG PO TABS
25.0000 ug | ORAL_TABLET | Freq: Every day | ORAL | Status: DC
  Administered 2023-09-08 – 2023-09-12 (×5): 25 ug via ORAL
  Filled 2023-09-07 (×5): qty 1

## 2023-09-07 MED ORDER — TRIAMCINOLONE ACETONIDE 0.1 % EX OINT
TOPICAL_OINTMENT | Freq: Two times a day (BID) | CUTANEOUS | Status: DC
  Filled 2023-09-07 (×2): qty 15

## 2023-09-07 MED ORDER — SEVELAMER CARBONATE 800 MG PO TABS
1600.0000 mg | ORAL_TABLET | Freq: Three times a day (TID) | ORAL | Status: DC
  Administered 2023-09-08 – 2023-09-12 (×11): 1600 mg via ORAL
  Filled 2023-09-07 (×12): qty 2

## 2023-09-07 MED ORDER — HYDROMORPHONE HCL 2 MG PO TABS
4.0000 mg | ORAL_TABLET | Freq: Once | ORAL | Status: AC
  Administered 2023-09-07: 4 mg via ORAL

## 2023-09-07 MED ORDER — APIXABAN 5 MG PO TABS
5.0000 mg | ORAL_TABLET | ORAL | Status: AC
  Administered 2023-09-07: 5 mg via ORAL
  Filled 2023-09-07: qty 1

## 2023-09-07 MED ORDER — CHLORHEXIDINE GLUCONATE CLOTH 2 % EX PADS: 6.0000 | MEDICATED_PAD | Freq: Every day | CUTANEOUS | Status: DC

## 2023-09-07 MED ORDER — GUAIFENESIN 100 MG/5ML PO LIQD: 5.0000 mL | ORAL | Status: DC | PRN

## 2023-09-07 MED ORDER — VENLAFAXINE HCL ER 37.5 MG PO CP24
37.5000 mg | ORAL_CAPSULE | Freq: Every day | ORAL | Status: DC
  Administered 2023-09-08 – 2023-09-12 (×5): 37.5 mg via ORAL
  Filled 2023-09-07 (×5): qty 1

## 2023-09-07 MED ORDER — GERHARDT'S BUTT CREAM: 1.0000 | TOPICAL_CREAM | Freq: Three times a day (TID) | CUTANEOUS | Status: DC

## 2023-09-07 MED ORDER — CINACALCET HCL 30 MG PO TABS
30.0000 mg | ORAL_TABLET | ORAL | Status: DC
  Administered 2023-09-11: 30 mg via ORAL
  Filled 2023-09-07 (×2): qty 1

## 2023-09-07 MED ORDER — HYDROMORPHONE HCL 2 MG PO TABS
4.0000 mg | ORAL_TABLET | ORAL | Status: DC
  Filled 2023-09-07: qty 2

## 2023-09-07 NOTE — Telephone Encounter (Signed)
[name] mother requesting a call back about the patient being D/C to a facility yesterday from our service to the [facility].  [name] mother states the facility is unable to accommodate the patient.   Per the patient's mother a Referral was to take place today to a [facility] nursing home Rehab and she wants to know what to do.

## 2023-09-07 NOTE — H&P (Cosign Needed Addendum)
Date: 09/07/2023               Patient Name:  Phillip Young MRN: [id number]  DOB: Oct 10, 1985 Age / Sex: 38 y.o., male   PCP: [name], DO         Medical Service: Internal Medicine Teaching Service         Attending Physician: Dr. Dickie La, MD      First Contact: Dr. Lovie Macadamia MD Pager [phone]    Second Contact: Dr. Champ Mungo, DO Pager [phone]         After Hours (After 5p/  First Contact Pager: [phone]  weekends / holidays): Second Contact Pager: [phone]   SUBJECTIVE   Chief Complaint: Pain and shortness of breath  History of Present Illness:   This is a 38 year old male with past medical history of chronic hypoxemic respiratory failure on 2 L of nasal cannula oxygen daily, HFrEF, A-fib on Eliquis, chronic pain, ESRD on dialysis who presents to the emergency department from a living facility with concerns of pain and shortness of breath.  He states he was discharged yesterday with no concerns, and reports since then he has not been given any medication.  He endorses that he went to sleep earlier today and woke up with shortness of breath.  He denied any chest pain any lightheadedness.  He states he felt his heart pounding.  He reported to his staff at his facility and was subsequently brought to the emergency department for further evaluation and management.  Upon further questioning, he denies any nausea, vomiting, abdominal pain, chest pain, or any other concerns.  He states he has slowly improved, but his major concern right now is his leg pain.  He states his leg pain is chronic.  He has not been getting his pain medicine.  He thinks his left leg is swollen.  He states that he would not like to go back to [facility] if he does not have to and request to go elsewhere as he was not comfortable with [facility].  ED Course:  Patient presented to the emergency department initially via EMS with vital sign showing pulse 98, afebrile, respirations 24, blood  pressure 123/102, satting at 99% on 6 L nasal cannula.  Initial labs showed hemoglobin 9.6, platelets 182, hematocrit 31.8, Sodium 134, bicarb 18, creatinine 9.84, GFR 6, digoxin level 0.6.  Patient EKG which showed evidence of atrial fibrillation with RVR.  Emergency department nephrology was consulted as well as cardiology.  Plan for dialysis tomorrow.  Starting digoxin.  Social worker was also consulted in ED for placement.  Ultimately IMTS was consulted for further evaluation management for placement and further management of A-fib with RVR.  Past Medical History Past Medical History:  Diagnosis Date   Acute on chronic respiratory failure with hypoxia ([name]) 04/21/2021   Acute on chronic systolic (congestive) heart failure ([name]) 02/26/2020   Amiodarone toxicity    Anemia    Atrial flutter ([name])    Biventricular congestive heart failure ([name])    Chronic hypoxemic respiratory failure ([name])    Class 3 severe obesity due to excess calories with serious comorbidity and body mass index (BMI) of 50.0 to 59.9 in adult [facility]) 02/26/2020   Essential hypertension 02/26/2020   GERD without esophagitis 02/26/2020   Hidradenitis suppurativa 02/26/2020   NICM (nonischemic cardiomyopathy) ([name])    Obesity hypoventilation syndrome ([name])    OSA (obstructive sleep apnea)    PAF (paroxysmal atrial fibrillation) ([name])    Pneumonia  Prediabetes 02/26/2020     Meds:  Acetaminophen 1000 mg every 6 hours Eliquis 5 mg twice daily Vitamin D3 400 units daily Cinacalcet 30 mg every Tuesday, Thursday, Saturday with dialysis Aranesp given during dialysis Digoxin 0.125 mg 3 times weekly on Monday, Wednesday, Friday Gerhardt's Butt cream Dilaudid 4 mg every 6 hours as needed Levothyroxine 25 mcg daily Lidocaine ointment Midodrine 30 mg 3 times daily Bactroban cream Pregabalin 50 mg daily Renvela 1600 mg 3 times daily with meals Tirzepatide 2.5 mg weekly Venlafaxine 37.5 mg daily  Past Surgical History   Past Surgical History:  Procedure Laterality Date   ABSCESS DRAINAGE     AV FISTULA PLACEMENT Left 08/21/2021   Procedure: LEFT ARM ARTERIOVENOUS (AV) FISTULA.;  Surgeon: Nada Libman, MD;  Location: [facility] OR;  Service: Vascular;  Laterality: Left;   CARDIAC CATHETERIZATION     CARDIOVERSION N/A 10/09/2021   Procedure: CARDIOVERSION;  Surgeon: Laurey Morale, MD;  Location: [facility] ENDOSCOPY;  Service: Cardiovascular;  Laterality: N/A;   CARDIOVERSION N/A 05/28/2022   Procedure: CARDIOVERSION;  Surgeon: Laurey Morale, MD;  Location: [facility] ENDOSCOPY;  Service: Cardiovascular;  Laterality: N/A;   CARDIOVERSION N/A 06/07/2022   Procedure: CARDIOVERSION;  Surgeon: Laurey Morale, MD;  Location: [facility] ENDOSCOPY;  Service: Cardiovascular;  Laterality: N/A;   IR FLUORO GUIDE CV LINE RIGHT  03/10/2021   IR FLUORO GUIDE CV LINE RIGHT  04/22/2021   IR FLUORO GUIDE CV LINE RIGHT  08/20/2021   IR FLUORO GUIDE CV LINE RIGHT  03/01/2023   IR FLUORO GUIDE CV LINE RIGHT  03/09/2023   IR FLUORO GUIDE CV LINE RIGHT  05/30/2023   IR PTA VENOUS EXCEPT DIALYSIS CIRCUIT  03/09/2023   IR REMOVAL TUN CV CATH W/O FL  05/26/2023   IR REMOVE CV FIBRIN SHEATH  03/09/2023   IR US GUIDE VASC ACCESS RIGHT  03/10/2021   IR US GUIDE VASC ACCESS RIGHT  04/22/2021   IR US GUIDE VASC ACCESS RIGHT  05/30/2023   RIGHT HEART CATH N/A 03/06/2021   Procedure: RIGHT HEART CATH;  Surgeon: Dolores Patty, MD;  Location: [facility] INVASIVE CV LAB;  Service: Cardiovascular;  Laterality: N/A;   RIGHT/LEFT HEART CATH AND CORONARY ANGIOGRAPHY N/A 03/04/2020   Procedure: RIGHT/LEFT HEART CATH AND CORONARY ANGIOGRAPHY;  Surgeon: Laurey Morale, MD;  Location: [facility] INVASIVE CV LAB;  Service: Cardiovascular;  Laterality: N/A;   TEE WITHOUT CARDIOVERSION N/A 05/05/2021   Procedure: TRANSESOPHAGEAL ECHOCARDIOGRAM (TEE);  Surgeon: Laurey Morale, MD;  Location: [facility] ENDOSCOPY;  Service: Cardiovascular;  Laterality: N/A;   TEMPORARY DIALYSIS CATHETER   03/06/2021   Procedure: TEMPORARY DIALYSIS CATHETER;  Surgeon: Dolores Patty, MD;  Location: [facility] INVASIVE CV LAB;  Service: Cardiovascular;;    Social:  Lives With: Currently at [facility], previously with the mother Occupation: N/A Support: Good support in mother who was on phone with patient upon my exam Level of Function: Dependent in all ADLs and IADLs PCP: [name], DO  Substances: No substance use  Family History: N/A  Allergies: Allergies as of 09/07/2023 - Review Complete 09/07/2023  Allergen Reaction Noted   Amiodarone Other (See Comments) 06/01/2022   Coreg [carvedilol] Shortness Of Breath and Diarrhea 07/07/2020   Heparin Other (See Comments) 03/05/2021   Metoprolol Other (See Comments) 08/04/2022   Amoxicillin Other (See Comments) 01/26/2023   Other Swelling and Other (See Comments) 01/26/2023    Review of Systems: A complete ROS was negative except as per HPI.  OBJECTIVE:   Physical Exam: Blood pressure 125/63, pulse (!) 103, temperature 97.9 F (36.6 C), temperature source Oral, resp. rate (!) 32, height 6' (1.829 m), weight (!) 164.7 kg, SpO2 100%.  Constitutional: chronically ill-appearing man laying in bed, in no acute distress. He is obese. HENT: normocephalic atraumatic, mucous membranes moist Eyes: conjunctiva non-erythematous Neck: supple Cardiovascular: irregular rhythm and tachycardic, no m/r/g Pulmonary/Chest: normal work of breathing on room air, lungs clear to auscultation bilaterally. Hiko in place at 5L. Abdominal: soft, non-tender, non-distended. + bowel sounds. MSK: normal bulk and tone. Trace edema bilaterally worse on L. The distal extremities are chronically tender to touch. There is mild bleeding at R great toe nail bed from a previous injury, no signs infection. Neurological: alert & oriented x 3 Skin: warm and dry. Diffuse healing and scarred point skin lesions on the trunks and limbs.  Labs: CBC    Component Value  Date/Time   WBC 9.2 09/07/2023 1452   RBC 2.87 (L) 09/07/2023 1452   HGB 9.6 (L) 09/07/2023 1452   HGB 11.7 (L) 06/21/2023 1606   HCT 31.8 (L) 09/07/2023 1452   HCT 36.4 (L) 06/21/2023 1606   PLT 182 09/07/2023 1452   PLT 230 06/21/2023 1606   MCV 110.8 (H) 09/07/2023 1452   MCV 97 06/21/2023 1606   MCH 33.4 09/07/2023 1452   MCHC 30.2 09/07/2023 1452   RDW 16.2 (H) 09/07/2023 1452   RDW 14.3 06/21/2023 1606   LYMPHSABS 1.1 09/07/2023 1452   MONOABS 0.4 09/07/2023 1452   EOSABS 1.1 (H) 09/07/2023 1452   BASOSABS 0.1 09/07/2023 1452     CMP     Component Value Date/Time   NA 134 (L) 09/07/2023 1452   NA 134 06/21/2023 1606   K 4.9 09/07/2023 1452   CL 98 09/07/2023 1452   CO2 18 (L) 09/07/2023 1452   GLUCOSE 112 (H) 09/07/2023 1452   BUN 81 (H) 09/07/2023 1452   BUN 41 (H) 06/21/2023 1606   CREATININE 9.84 (H) 09/07/2023 1452   CREATININE 9.17 (H) 08/26/2021 1014   CALCIUM 9.6 09/07/2023 1452   PROT 6.8 09/07/2023 1452   PROT 7.5 09/15/2021 1147   ALBUMIN 3.3 (L) 09/07/2023 1452   ALBUMIN 4.3 09/15/2021 1147   AST 26 09/07/2023 1452   ALT 18 09/07/2023 1452   ALKPHOS 125 09/07/2023 1452   BILITOT 1.1 09/07/2023 1452   BILITOT 1.1 09/15/2021 1147   GFRNONAA 6 (L) 09/07/2023 1452   GFRAA >60 03/27/2020 1055    Imaging:  Chest x-ray: Showing chronic cardiomegaly.  Mild vascular congestion.  Trace fluid in the fissures without significant subpulmonic effusion  EKG: personally reviewed my interpretation is atrial fibrillation with RVR with a rate of 114.  When compared to previous EKG, not in RVR in previous EKG.  ASSESSMENT & PLAN:   Assessment & Plan by Problem: Principal Problem:   Atrial fibrillation with RVR ([name]) Active Problems:   Morbid obesity ([name])   OSA (obstructive sleep apnea)   Hidradenitis suppurativa   Chronic systolic CHF (congestive heart failure) ([name])   Chronic respiratory failure with hypoxia ([name])   ESRD (end stage renal disease) on  dialysis ([name])   Hypotension   Neuropathy   Gage Weant is a 38 y.o. man with a history of ESRD on dialysis Monday, Wednesday, Friday, chronic respiratory failure in the setting of amiodarone toxicity with baseline of 2 L oxygen chronically, HFrEF (most recent echocardiogram on 05/26/2023 showing ejection fraction of 30 to 35%), atrial  fibrillation, chronic pain who presents to the emergency department concerns of shortness of breath and found to be in A-fib with RVR.  Patient admitted for further evaluation management of atrial fibrillation with RVR.   Atrial fibrillation with RVR   Episode started while patient was at his facility, it awoke him from sleep. He had shortness of breath and felt palpitations. He had missed today's Eliquis as well as his digoxin with lower digoxin levels noted on labs. Doubt ACS or PE per his history and his chronic anticoagulation. Precipitation of his A-fib with RVR may be secondary to missing doses of his digoxin. He is currently hemodynamically stable. Rates have come down. Cardiology on board.  During evaluation, patient did have significant coughing episode so will check RVP.   Patient has history of refractory Afib. He is not a candidate for amiodarone or AV nodal ablation with PPM. He has multiple failed attempts at DCCV. He requires midodrine for BP so not a candidate for nodal agents  -Cardiology following, thank you, await further recs. -Resume digoxin 0.125 mg 3 times weekly, Monday, Wednesday, Fridays -Resume Eliquis 5 mg twice daily -Telemetry -Given this is an acute onset, and patient has only missed 1-2 doses of Eliquis max, do not think an echo would be revealing for any thrombus at this time -Check magnesium level -Check RVP  ESRD on dialysis MWF ESRD anemia Nephrology following for ESRD.  Planning for dialysis tomorrow 2/13, he missed HD today. No emergent volume/electrolytes/uremia. -Nephrology following, thank you -Continue Cinacalcet 30  mg every Tuesday, Thursday, Saturday -Continue Renvela 1600 mg 3 times daily with meals  Chronic hypoxemic respiratory failure secondary to amiodarone toxicity During episode, patient did have increased oxygen need, but while in the room turned patient back down to home 2 L and patient satting well. His pulse oximeter is not sensitive intermittently and in the past he has needed placement on the ear. No acute concerns for acute hypoxemic respiratory failure at this time. -Continue oxygen supplementation to keep goal greater than 92%  [place] HFrEF (EF 30%-35%), not current exacerbation Patient has a past medical history of heart failure with reduced ejection fraction.  His most recent echo showing EF of 30 to 35% in October 2024.  Patient denies any chest pain.  On exam, patient does not look grossly volume overloaded and lungs are clear, though imaging reveals trace edema. Do not think patient is currently in heart failure exacerbation. Volume managed per dialysis. -Continue dialysis per nephrology -Due to chronic hypotension, not a candidate for other GDMT  Chronic somatic and neuropathic pain on chronic opiate therapy Pain subsequen to prolonged hospitalization with development of critical illness neuropathy which has not resolved.  He endorses having pain today.  He describes it to be icy and fiery in nature.  He was on oxycodone in the past. -Continue Tylenol 1000 mg 4 times daily -Continue Lyrica 50 mg daily -Continue Dilaudid 4 mg every 6 hours as needed -Continue venlafaxine 37.5 mg daily -PT consulted to continue to work while inpatient  Hypothyroidism Home med levothyroxine 25 mcg daily. Recently had levels checked which were normal about 4 days ago. No acute concerns. -Continue levothyroxine 25 mcg daily  Chronic hypotension Chronically on 30 mg of midodrine 3 times daily.  No acute concerns. -Continue midodrine 30 milligrams 3 times daily  Big Toe Wound Noted at discharge  yesterday 09/06/23. Today, no signs of infection or purulence. At discharge was instructed warm soaks 10-15 minutes tid followed by mupirocin  ointment for 7 days. - Mupirocin per above  Hidradenitis Suppurativa  Triamcinolone ointment over left lateral thigh. Foam dressings.  Sacral decubitus ulcer, stage IV Foam dressing needs to be in place over sacral area.   SDOH Came from [facility] but had poor experience. -TOC consult for SNF placement  Overall, patient admitted for atrial fibrillation with RVR.  Patient did present with concerns of current living situation as well.  Patient does not seem eager to go back to [facility], and request new SNF.  Social work working diligently to try to find patient new placement.   Diet: Renal VTE: DOAC IVF: None Code: Full  Prior to Admission Living Arrangement: SNF, [facility] Anticipated Discharge Location: SNF Barriers to Discharge: Clinical Improvement and SNF placement    Dispo: Admit patient to Observation with expected length of stay less than 2 midnights.  Signed: [name], DO Internal Medicine Resident PGY-1 Pager: [phone] 09/07/2023, 10:53 PM   On weekends or after 5pm please page on call intern or resident: First contact: [phone] If no answer in 15 minutes, please contact senior pager at [phone]

## 2023-09-07 NOTE — Telephone Encounter (Signed)
Currently in the ED per chart.

## 2023-09-07 NOTE — Consult Note (Addendum)
Cardiology Consultation   Patient ID: Phillip Young MRN: [id number]; DOB: 18-Nov-1985  Admit date: 09/07/2023 Date of Consult: 09/07/2023  PCP:  [name], DO   [facility] Providers Cardiologist:  Marca Ancona, MD  Electrophysiologist:  Will Jorja Loa, MD       Patient Profile:   Phillip Young is a 38 y.o. male with a hx of HFrEF (EF = 30-35%), PAF (on Eliquis) s/p ablation (07/01/23), DM2 c/b peripheral neuropathy, hypothyroidism, morbid obesity, OSA, chronic hypoxic respiratory failure (bl 6L), HIT, hidradenitis suppurativa, and chronic pain syndrome (on opioids) who is being seen 09/07/2023 for the evaluation of atrial fibrillation with RVR at the request of Dr. Eloise Harman.   History of Present Illness:   Mr. Ende was just discharged from [facility] yesterday after being hospitalized from 2/4 -2/11 for volume overload in the setting of missed dialysis sessions.  He was subsequently discharged to a SNF, but reports that he was unhappy with the care that he was receiving there noting that they were not attentive nor giving him his medications.  He reports that this gave him significant anxiety and he suffered a " panic attack" resulting in SOB and tachycardia.  He also missed dialysis this morning.  Given his symptoms EMS was called and he was brought in for evaluation.  Notably the patient has known atrial fibrillation that has been difficult to control due to several medication intolerances.  Previously, he has been unable to tolerate beta-blockers due to hypotension, CCB's due to heart failure, and amiodarone due to lung and liver toxicity.  He currently remains on digoxin for which she reports adequate adherence.  He underwent an atrial fibrillation ablation on 07/01/2023 [facility], but his atrial fibrillation has since recurred.  He endorses good adherence to his Eliquis.  He denies fevers, chills, sweats, chest pain, syncope, presyncope, palpitations, bleeding,  vomiting, or diarrhea.  His other complaints include chronic bilateral lower extremity pain, purulent drainage from his right great toe, PND, orthopnea and lower extremity swelling.  He denies tobacco, alcohol, illicit drug use.  In the ED his VS were afebrile, HR 100-130, BPs 123/102, RR 24, satting 99% on 6 L O2.  Labs notable for bicarb 18, creatinine 9.8, hemoglobin 9.6, normal lactate, and low digoxin level = 0.6.  CXR showed mild vascular congestion and trace fluid in the fissures.  ECG showed coarse atrial fibrillation with RVR.  Cardiology is consulted for evaluation of atrial fibrillation.  Past Medical History:  Diagnosis Date   Acute on chronic respiratory failure with hypoxia ([name]) 04/21/2021   Acute on chronic systolic (congestive) heart failure ([name]) 02/26/2020   Amiodarone toxicity    Anemia    Atrial flutter ([name])    Biventricular congestive heart failure ([name])    Chronic hypoxemic respiratory failure ([name])    Class 3 severe obesity due to excess calories with serious comorbidity and body mass index (BMI) of 50.0 to 59.9 in adult [facility]) 02/26/2020   Essential hypertension 02/26/2020   GERD without esophagitis 02/26/2020   Hidradenitis suppurativa 02/26/2020   NICM (nonischemic cardiomyopathy) ([name])    Obesity hypoventilation syndrome ([name])    OSA (obstructive sleep apnea)    PAF (paroxysmal atrial fibrillation) ([name])    Pneumonia    Prediabetes 02/26/2020    Past Surgical History:  Procedure Laterality Date   ABSCESS DRAINAGE     AV FISTULA PLACEMENT Left 08/21/2021   Procedure: LEFT ARM ARTERIOVENOUS (AV) FISTULA.;  Surgeon: Nada Libman, MD;  Location: [facility]  OR;  Service: Vascular;  Laterality: Left;   CARDIAC CATHETERIZATION     CARDIOVERSION N/A 10/09/2021   Procedure: CARDIOVERSION;  Surgeon: Laurey Morale, MD;  Location: [facility] ENDOSCOPY;  Service: Cardiovascular;  Laterality: N/A;   CARDIOVERSION N/A 05/28/2022   Procedure: CARDIOVERSION;  Surgeon: Laurey Morale, MD;  Location: [facility] ENDOSCOPY;  Service: Cardiovascular;  Laterality: N/A;   CARDIOVERSION N/A 06/07/2022   Procedure: CARDIOVERSION;  Surgeon: Laurey Morale, MD;  Location: [facility] ENDOSCOPY;  Service: Cardiovascular;  Laterality: N/A;   IR FLUORO GUIDE CV LINE RIGHT  03/10/2021   IR FLUORO GUIDE CV LINE RIGHT  04/22/2021   IR FLUORO GUIDE CV LINE RIGHT  08/20/2021   IR FLUORO GUIDE CV LINE RIGHT  03/01/2023   IR FLUORO GUIDE CV LINE RIGHT  03/09/2023   IR FLUORO GUIDE CV LINE RIGHT  05/30/2023   IR PTA VENOUS EXCEPT DIALYSIS CIRCUIT  03/09/2023   IR REMOVAL TUN CV CATH W/O FL  05/26/2023   IR REMOVE CV FIBRIN SHEATH  03/09/2023   IR US GUIDE VASC ACCESS RIGHT  03/10/2021   IR US GUIDE VASC ACCESS RIGHT  04/22/2021   IR US GUIDE VASC ACCESS RIGHT  05/30/2023   RIGHT HEART CATH N/A 03/06/2021   Procedure: RIGHT HEART CATH;  Surgeon: Dolores Patty, MD;  Location: [facility] INVASIVE CV LAB;  Service: Cardiovascular;  Laterality: N/A;   RIGHT/LEFT HEART CATH AND CORONARY ANGIOGRAPHY N/A 03/04/2020   Procedure: RIGHT/LEFT HEART CATH AND CORONARY ANGIOGRAPHY;  Surgeon: Laurey Morale, MD;  Location: [facility] INVASIVE CV LAB;  Service: Cardiovascular;  Laterality: N/A;   TEE WITHOUT CARDIOVERSION N/A 05/05/2021   Procedure: TRANSESOPHAGEAL ECHOCARDIOGRAM (TEE);  Surgeon: Laurey Morale, MD;  Location: [facility] ENDOSCOPY;  Service: Cardiovascular;  Laterality: N/A;   TEMPORARY DIALYSIS CATHETER  03/06/2021   Procedure: TEMPORARY DIALYSIS CATHETER;  Surgeon: Dolores Patty, MD;  Location: [facility] INVASIVE CV LAB;  Service: Cardiovascular;;     Home Medications:  Prior to Admission medications   Medication Sig Start Date End Date Taking? Authorizing Provider  acetaminophen (TYLENOL) 500 MG tablet Take 2 tablets (1,000 mg total) by mouth every 6 (six) hours. 09/06/23  Yes Champ Mungo, DO  apixaban (ELIQUIS) 5 MG TABS tablet Take 1 tablet (5 mg total) by mouth 2 (two) times daily. 06/03/23  Yes [name], DO  cholecalciferol (VITAMIN D3) 10 MCG (400 UNIT) TABS tablet Take 1 tablet (400 Units total) by mouth daily. 06/20/23  Yes Setzer, Lynnell Jude, PA-C  cinacalcet (SENSIPAR) 30 MG tablet Take 1 tablet (30 mg total) by mouth Every Tuesday,Thursday,and Saturday with dialysis. 09/06/23  Yes Champ Mungo, DO  digoxin (LANOXIN) 0.125 MG tablet Take 1 tablet (0.125 mg total) by mouth 3 (three) times a week. Monday, Wednesday, Friday (dialysis days) 07/22/23  Yes Rocky Morel, DO  HYDROmorphone (DILAUDID) 4 MG tablet Take 4 mg by mouth every 6 (six) hours as needed for severe pain (pain score 7-10).   Yes [provider]  levothyroxine (SYNTHROID) 25 MCG tablet Take 1 tablet (25 mcg total) by mouth daily at 6 (six) AM. 07/21/23  Yes Rocky Morel, DO  lidocaine (XYLOCAINE) 5 % ointment Apply 1 Application topically as needed. Patient taking differently: Apply 1 Application topically as needed (for port access). 06/30/23  Yes Juberg, Cristal Deer, DO  midodrine (PROAMATINE) 10 MG tablet Take 3 tablets (30 mg total) by mouth 3 (three) times daily with meals. 06/20/23  Yes Setzer, Lynnell Jude, PA-C  mupirocin  cream (BACTROBAN) 2 % Apply topically 2 (two) times daily for 7 days. 09/06/23 09/13/23 Yes Champ Mungo, DO  pregabalin (LYRICA) 50 MG capsule Take 1 capsule (50 mg total) by mouth daily. Take one capsule daily. 07/25/23  Yes Champ Mungo, DO  sevelamer carbonate (RENVELA) 800 MG tablet Take 2 tablets (1,600 mg total) by mouth 3 (three) times daily with meals. 09/06/23  Yes Champ Mungo, DO  tirzepatide (ZEPBOUND) 2.5 MG/0.5ML injection vial Inject 2.5 mg into the skin once a week. 08/25/23  Yes Lovie Macadamia, MD  venlafaxine XR (EFFEXOR XR) 37.5 MG 24 hr capsule Take 1 capsule (37.5 mg total) by mouth daily. 07/07/23 07/06/24 Yes [name], DO  Darbepoetin Alfa (ARANESP) 150 MCG/0.3ML SOSY injection To be given during dialysis by [facility] per Nephrology. Not to be  given to patient at SNF. 09/06/23   Lovie Macadamia, MD  Nystatin (GERHARDT'S BUTT CREAM) CREA Apply 1 Application topically 3 (three) times daily. Patient not taking: Reported on 09/07/2023 05/24/23   [name], DO    Inpatient Medications: Scheduled Meds:  HYDROmorphone  4 mg Oral STAT   Continuous Infusions:  PRN Meds: albuterol  Allergies:    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, SRA positive   Metoprolol Other (See Comments)    near syncope   Amoxicillin Other (See Comments)    Was hospitalized    Other Swelling and Other (See Comments)    Steroids Fluid seeping out of legs     Social History:   Social History   Socioeconomic History   Marital status: Single    Spouse name: Not on file   Number of children: Not on file   Years of education: Not on file   Highest education level: Not on file  Occupational History   Not on file  Tobacco Use   Smoking status: Former    Current packs/day: 0.00    Types: Cigarettes    Quit date: 2019    Years since quitting: 6.1    Passive exposure: Current   Smokeless tobacco: Former   Tobacco comments:    quit in 2019  Vaping Use   Vaping status: Never Used  Substance and Sexual Activity   Alcohol use: Never   Drug use: Never   Sexual activity: Not on file  Other Topics Concern   Not on file  Social History Narrative   Not on file   Social Drivers of Health   Financial Resource Strain: Low Risk  (03/24/2023)   Received from [facility]   Overall Financial Resource Strain (CARDIA)    Difficulty of Paying Living Expenses: Not very hard  Food Insecurity: No Food Insecurity (08/30/2023)   Hunger Vital Sign    Worried About Running Out of Food in the Last Year: Never true    Ran Out of Food in the  Last Year: Never true  Transportation Needs: Unmet Transportation Needs (08/30/2023)   PRAPARE - Transportation    Lack of Transportation (Medical): Yes    Lack of Transportation (Non-Medical): Yes  Physical Activity: Inactive (09/21/2022)   Exercise Vital Sign    Days of Exercise per Week: 0 days    Minutes of Exercise per Session: 0 min  Stress: No Stress Concern Present (04/21/2023)  Received from [employer] [facility] [employer] - Occupational Stress Questionnaire    Feeling of Stress : Only a little  Social Connections: Moderately Isolated (03/24/2023)   Received from [facility]   Social Connection and Isolation Panel [NHANES]    Frequency of Communication with Friends and Family: More than three times a week    Frequency of Social Gatherings with Friends and Family: More than three times a week    Attends Religious Services: 1 to 4 times per year    Active Member of [employer] or Organizations: No    Attends Banker Meetings: Never    Marital Status: Never married  Intimate Partner Violence: Not At Risk (08/30/2023)   Humiliation, Afraid, Rape, and Kick questionnaire    Fear of Current or Ex-Partner: No    Emotionally Abused: No    Physically Abused: No    Sexually Abused: No    Family History:   Family History  Problem Relation Age of Onset   Heart disease Mother    Hypertension Mother    Pulmonary Hypertension Mother    Drug abuse Father        died due to Heroin overdose     ROS:  Please see the history of present illness.  All other ROS reviewed and negative.     Physical Exam/Data:   Vitals:   09/07/23 1845 09/07/23 1900 09/07/23 2000 09/07/23 2030  BP: 113/79 (!) 106/91 104/70 125/63  Pulse: (!) 136 (!) 103    Resp: (!) 28 (!) 26 (!) 25 (!) 32  Temp:      TempSrc:      SpO2: 100% 100%    Weight:      Height:       No intake or output data in the 24 hours ending 09/07/23 2204    09/07/2023    2:39 PM 09/05/2023     7:33 PM 09/05/2023    3:15 PM  Last 3 Weights  Weight (lbs) 363 lb 362 lb 14 oz 372 lb 12.8 oz  Weight (kg) 164.656 kg 164.6 kg 169.1 kg     Body mass index is 49.23 kg/m.  General: Morbidly obese gentleman laying in bed in NAD, very pleasant HEENT: Atraumatic, normocephalic Neck: no JVD but limited by body habitus Vascular: Radial pulses 2+ bilaterally Cardiac: Tachycardic with irregularly irregular rhythm, normal S1/S2, no M/R/G Lungs:  clear to auscultation bilaterally, no wheezing, rhonchi or rales  Abd: soft, nontender, no hepatomegaly, protuberant abdomen Ext: 1+ pitting edema bilaterally, right great toe with expressible purulence in the medial nailbed without associated erythema or tenderness to palpation Musculoskeletal: Grossly normal Skin: Cool and dry Neuro: Marked tenderness to palpation with light touch to the bilateral lower extremities, otherwise grossly normal Psych:  Normal affect   EKG:  The EKG was personally reviewed and demonstrates: Coarse atrial fibrillation with RVR vs atrial flutter with variable block      Telemetry:  Telemetry was personally reviewed and demonstrates: Atrial flutter with RVR  Relevant CV Studies:   TTE 05/26/23:  IMPRESSIONS     1. Left ventricular ejection fraction, by estimation, is 30 to 35%. The  left ventricle has moderately decreased function. The left ventricle  demonstrates global hypokinesis. The left ventricular internal cavity size  was severely dilated. Left  ventricular diastolic parameters are indeterminate.   2. Right ventricular systolic function is mildly reduced. The right  ventricular size is not well visualized. Tricuspid regurgitation signal is  inadequate for assessing PA pressure.   3. The mitral valve is normal in structure. Mild mitral valve  regurgitation.   4. The aortic valve is tricuspid. Aortic valve regurgitation is not  visualized.   5. The inferior vena cava is dilated in size with >50%  respiratory  variability, suggesting right atrial pressure of 8 mmHg.   RHC 03/06/21:  On DBA 7.5 and NE 5   RA = 17 RV = 53/19 PA = 57/18 (36) PCW = 23 Fick cardiac output/index = 6.0/2.1 Thermo CO/CI = 8.9/3.0 PVR = 2.2  FA sat = 98% PA sat = 52%, 54% PaPI = 2.3 RA/PCW = 0.74   Assessment: 1. Biventricular failure with elevated pressures R>L  2. Preserved CO with pressor support 3. Successful LIJ trialysis catheter placement  Laboratory Data:  High Sensitivity Troponin:  No results for input(s): "TROPONINIHS" in the last 720 hours.   Chemistry Recent Labs  Lab 09/02/23 0134 09/03/23 0457 09/04/23 0156 09/04/23 1037 09/05/23 0343 09/06/23 0335 09/07/23 1452  NA 136   < > 136  --  137 137 134*  K 4.3   < > 3.8  --  5.0 4.6 4.9  CL 97*   < > 96*  --  97* 95* 98  CO2 22   < > 23  --  24 24 18*  GLUCOSE 101*   < > 101*  --  107* 87 112*  BUN 47*   < > 46*  --  60* 51* 81*  CREATININE 8.68*   < > 8.06*  --  9.53* 7.36* 9.84*  CALCIUM 9.7   < > 9.5  --  10.6* 10.3 9.6  MG 2.1  --  1.9 2.0  --   --   --   GFRNONAA 7*   < > 8*  --  7* 9* 6*  ANIONGAP 17*   < > 17*  --  16* 18* 18*   < > = values in this interval not displayed.    Recent Labs  Lab 09/05/23 0343 09/06/23 0335 09/07/23 1452  PROT  --   --  6.8  ALBUMIN 3.3* 3.4* 3.3*  AST  --   --  26  ALT  --   --  18  ALKPHOS  --   --  125  BILITOT  --   --  1.1   Lipids No results for input(s): "CHOL", "TRIG", "HDL", "LABVLDL", "LDLCALC", "CHOLHDL" in the last 168 hours.  Hematology Recent Labs  Lab 09/03/23 0457 09/05/23 0343 09/07/23 1452  WBC 7.9 7.7 9.2  RBC 2.93* 2.97* 2.87*  HGB 9.8* 10.1* 9.6*  HCT 30.2* 30.8* 31.8*  MCV 103.1* 103.7* 110.8*  MCH 33.4 34.0 33.4  MCHC 32.5 32.8 30.2  RDW 16.7* 16.6* 16.2*  PLT 225 203 182   Thyroid  Recent Labs  Lab 09/03/23 0457  TSH 4.395  FREET4 0.95    BNPNo results for input(s): "BNP", "PROBNP" in the last 168 hours.  DDimer No results for  input(s): "DDIMER" in the last 168 hours.   Radiology/Studies:  [facility] Chest Port 1 View Result Date: 09/07/2023 CLINICAL DATA:  Shortness of breath. EXAM: PORTABLE CHEST 1 VIEW COMPARISON:  08/30/2023, additional priors reviewed FINDINGS: Patient had difficulty with the exam, both costophrenic angles are not included in the field of view. Right-sided dialysis catheter in place. Chronic cardiomegaly. Unchanged mediastinal contours, globular appearance of the heart is a chronic finding. Mild vascular congestion. No confluent consolidation. Trace fluid in the  fissures without significant sub pulmonic effusion. No pneumothorax. IMPRESSION: Chronic cardiomegaly. Mild vascular congestion. Trace fluid in the fissures without significant sub pulmonic effusion. Electronically Signed   By: Narda Rutherford M.D.   On: 09/07/2023 17:08     Assessment and Plan:   Phillip Young is a 38 y.o. male with a hx of HFrEF (EF = 30-35%), PAF (on Eliquis) s/p ablation (07/01/23), DM2 c/b peripheral neuropathy, hypothyroidism, morbid obesity, OSA, chronic hypoxic respiratory failure (bl 6L), HIT, hidradenitis suppurativa, and chronic pain syndrome (on opioids) who is being seen 09/07/2023 for the evaluation of atrial fibrillation with RVR at the request of Dr. Eloise Harman.   #Atrial Fibrillation with RVR :: Patient presenting with acute on chronic SOB in the setting of atrial fibrillation with RVR.  His AF has historically been very challenging to control due to multiple drug intolerances as described in the HPI.  My current suspicion is that his RVR is being driven by volume overload which is a consequence of his heart failure.  Thankfully his heart rates are not critically elevated, so would favor continuing rate control with digoxin for now.  Could also consider beta-blocker therapy temporarily if necessary as well.  Ultimately I think getting fluid off of him will help improve his atrial stretch and thus his AF burden.  Would  also recommend discussing this case with the EP for consideration of repeat ablation if feasible. -Continue home digoxin 0.125 mg thrice weekly -Continue Eliquis 5 mg twice daily -Consider EP consultation for complex management -Complete echo -Maintain telemetry -Check TSH -Please hold home tirzepatide in the event that a TEE + DCCV is needed this hospitalization  #Acute on chronic HFrEF (EF = 30-35%) #Acute on Chronic Respiratory Failure :: Appears volume overloaded by exam.  Makes some urine but minimal.  Recommend consultation to nephrology for dialysis.  He may need a couple of consecutive days of UF to get adequate fluid removal. -Please consult nephrology for dialysis and fluid removal -Complete echo as above -GDMT limited given renal failure  #Paronychia? :: Right great toe with what appears to be an ingrown toenail with associated swelling and expressible purulence at the base of the nailbed.  He says it has been going on for at least a couple weeks and that the purulence had resolved; however, that is not the case by my exam.  Given that the patient has peripheral neuropathy and is diabetic, he is high risk for complications of an infected toe.  Will obtain an x-ray of the great toe as an initial screen for any deeper infection.  Also recommend consultation to podiatry and/or wound care if available. -Consider podiatry consultation -X-ray right great toe -Routine wound care great toe    Risk Assessment/Risk Scores:        New York Heart Association (NYHA) Functional Class NYHA Class IV  CHA2DS2-VASc Score = 3  This indicates a 3.2% annual risk of stroke. The patient's score is based upon: CHF, HTN, DM2           For questions or updates, please contact [facility] Please consult [email or web address] for contact info under    Signed, Karl Ito, MD  09/07/2023 10:04 PM

## 2023-09-07 NOTE — Progress Notes (Signed)
CSW received call from Tammy at [facility] who states patient came to the facility last night late without prescriptions for his narcotics. Per Tammy, patient needs to be dialyzed and hard scripts for narcotics then can return to the facility. Tammy states patient may not want to return to the facility and that a referral was sent to [facility] and [facility] in [place] this morning but bed offers were not obtained.   Patient's most recent hospitalization was greater than 3 midnights so he qualifies for SNF from the ED if needed.  Edwin Dada, MSW, LCSW Transitions of Care  Clinical Social Worker II [phone]

## 2023-09-07 NOTE — Discharge Planning (Signed)
[facility] Patient Discharge Orders- [facility] CLINIC: [facility]- Dc'd to [facility]  Patient's name: Phillip Young Admit/DC Dates: 08/30/2023 - 09/06/2023  Discharge Diagnoses: Volume overload 2nd missed HD. Transportation issues. SW working with him. Plan for insurance to be changed to [facility] (to start next month). DC to [facility]   Chronic resp failure - on home O2 2-4L. On 5L here. Continue to push UF HFrEF - last EF 30-35% Afib - on digoxin  Aranesp: Given: No    Last Hgb: 10.1 PRBC's Given: No  ESA dose for discharge: Resume mircera 200 mcg IV q 2 weeks. Give next dose TODAY with HD at [facility]! IV Iron dose at discharge: N/A  Heparin change: N/A. Patient has allergy to heparin.   EDW Change: No. Continue to push UF  [place] Change: No  Access intervention/Change: No   Hectorol/Calcitriol change: N/A. Continue Sensipar with HD  Discharge Labs: Calcium 10.3 Phosphorus 5.2 Albumin 3.4 K+ 4.6  IV Antibiotics: No  On Coumadin?: No. On Eliquis   OTHER/APPTS/LAB ORDERS: We need to change his binders in outpatient!!!! Patient reported to me that Fosrenol was causing GI upset so changed to Renvela here which he was tolerating. Ensure the RD is aware of this change. Please have RD or renal APP update binder regimen to Renvela in outpatient. Thank you    D/C Meds to be reconciled by nurse after every discharge.  Completed By: Salome Holmes, NP   Reviewed by: MD:______ RN_______

## 2023-09-07 NOTE — ED Provider Notes (Signed)
[place] EMERGENCY DEPARTMENT AT [facility] Provider Note   CSN: [id number] Arrival date & time: 09/07/23  1424     History {Add pertinent medical, surgical, social history, OB history to HPI:1} Chief Complaint  Patient presents with   Shortness of Breath   Vascular Access Problem    Phillip Young is a 38 y.o. male.  38 year old male history of CHF (30 to 35%), ESRD on IHD MWF, obesity, atrial fibrillation on Eliquis and digoxin, and chronic hypoxic respiratory failure on 2 to 4 L nasal cannula who presents emergency department with pain and shortness of breath.  Patient was discharged from the hospital yesterday to [facility].  Says that he was not able to get his medications there last night.  Woke up this morning and is having pain from his neuropathy in both of his feet.  Says he was not able to get his Dilaudid yet.  Started having a panic attack and said that he was not able to go to dialysis due to the pain.  Says that due to the panic attack he became very short of breath requested them to increase his oxygen to 6 L and call 911.       Home Medications Prior to Admission medications   Medication Sig Start Date End Date Taking? Authorizing Provider  acetaminophen (TYLENOL) 500 MG tablet Take 2 tablets (1,000 mg total) by mouth every 6 (six) hours. 09/06/23   Champ Mungo, DO  apixaban (ELIQUIS) 5 MG TABS tablet Take 1 tablet (5 mg total) by mouth 2 (two) times daily. 06/03/23   [name], DO  cholecalciferol (VITAMIN D3) 10 MCG (400 UNIT) TABS tablet Take 1 tablet (400 Units total) by mouth daily. 06/20/23   Setzer, Lynnell Jude, PA-C  cinacalcet (SENSIPAR) 30 MG tablet Take 1 tablet (30 mg total) by mouth Every Tuesday,Thursday,and Saturday with dialysis. 09/06/23   Champ Mungo, DO  Darbepoetin Alfa (ARANESP) 150 MCG/0.3ML SOSY injection To be given during dialysis by [facility] per Nephrology. Not to be given to patient at SNF. 09/06/23    Lovie Macadamia, MD  digoxin (LANOXIN) 0.125 MG tablet Take 1 tablet (0.125 mg total) by mouth 3 (three) times a week. Monday, Wednesday, Friday (dialysis days) 07/22/23   Rocky Morel, DO  HYDROmorphone (DILAUDID) 4 MG tablet Take 4 mg by mouth every 6 (six) hours as needed for severe pain (pain score 7-10).    [provider]  levothyroxine (SYNTHROID) 25 MCG tablet Take 1 tablet (25 mcg total) by mouth daily at 6 (six) AM. 07/21/23   Rocky Morel, DO  lidocaine (XYLOCAINE) 5 % ointment Apply 1 Application topically as needed. Patient taking differently: Apply 1 Application topically as needed (for port access). 06/30/23   [name], DO  midodrine (PROAMATINE) 10 MG tablet Take 3 tablets (30 mg total) by mouth 3 (three) times daily with meals. 06/20/23   Setzer, Lynnell Jude, PA-C  mupirocin cream (BACTROBAN) 2 % Apply topically 2 (two) times daily for 7 days. 09/06/23 09/13/23  Champ Mungo, DO  Nystatin (GERHARDT'S BUTT CREAM) CREA Apply 1 Application topically 3 (three) times daily. 05/24/23   [name], DO  pregabalin (LYRICA) 50 MG capsule Take 1 capsule (50 mg total) by mouth daily. Take one capsule daily. 07/25/23   Champ Mungo, DO  sevelamer carbonate (RENVELA) 800 MG tablet Take 2 tablets (1,600 mg total) by mouth 3 (three) times daily with meals. 09/06/23   Champ Mungo, DO  tirzepatide (ZEPBOUND) 2.5 MG/0.5ML  injection vial Inject 2.5 mg into the skin once a week. Patient not taking: Reported on 08/30/2023 08/25/23   Lovie Macadamia, MD  venlafaxine XR (EFFEXOR XR) 37.5 MG 24 hr capsule Take 1 capsule (37.5 mg total) by mouth daily. 07/07/23 07/06/24  [name], DO      Allergies    Amiodarone, Coreg [carvedilol], Heparin, Metoprolol, Amoxicillin, and Other    Review of Systems   Review of Systems  Physical Exam Updated Vital Signs BP (!) 123/102 (BP Location: Left Arm)   Pulse 98   Temp 97.8 F (36.6 C) (Oral)   Resp (!) 24   Ht 6'  (1.829 m)   Wt (!) 164.7 kg   SpO2 99%   BMI 49.23 kg/m  Physical Exam Vitals and nursing note reviewed.  Constitutional:      General: He is not in acute distress.    Appearance: He is well-developed.  HENT:     Head: Normocephalic and atraumatic.     Right Ear: External ear normal.     Left Ear: External ear normal.     Nose: Nose normal.  Eyes:     Extraocular Movements: Extraocular movements intact.     Conjunctiva/sclera: Conjunctivae normal.     Pupils: Pupils are equal, round, and reactive to light.  Cardiovascular:     Rate and Rhythm: Tachycardia present. Rhythm irregular.     Heart sounds: Normal heart sounds.  Pulmonary:     Effort: Pulmonary effort is normal. No respiratory distress.     Breath sounds: Normal breath sounds.     Comments: On 3.5 L nasal cannula.  Normal work of breathing. Musculoskeletal:     Cervical back: Normal range of motion and neck supple.     Right lower leg: Edema present.     Left lower leg: Edema present.  Skin:    General: Skin is warm and dry.  Neurological:     Mental Status: He is alert. Mental status is at baseline.  Psychiatric:        Mood and Affect: Mood normal.        Behavior: Behavior normal.     ED Results / Procedures / Treatments   Labs (all labs ordered are listed, but only abnormal results are displayed) Labs Reviewed  CBC WITH DIFFERENTIAL/PLATELET  COMPREHENSIVE METABOLIC PANEL  DIGOXIN LEVEL    EKG EKG Interpretation Date/Time:  Wednesday September 07 2023 14:40:34 EST Ventricular Rate:  114 PR Interval:    QRS Duration:  90 QT Interval:  310 QTC Calculation: 427 R Axis:   94  Text Interpretation: Atrial fibrillation with rapid ventricular response Rightward axis Cannot rule out Anterior infarct , age undetermined Abnormal ECG No significant change since last tracing Confirmed by Melene Plan [phone]) on 09/07/2023 2:41:59 PM  Radiology No results found.  Procedures .Ultrasound ED Peripheral IV  (Provider)  Date/Time: 09/07/2023 4:44 PM  Performed by: Rondel Baton, MD Authorized by: Rondel Baton, MD   Procedure details:    Indications: poor IV access     Skin Prep: chlorhexidine gluconate     Location:  Right forearm   Angiocath:  20 G   Bedside Ultrasound Guided: Yes     Images: not archived     Patient tolerated procedure without complications: Yes     Dressing applied: Yes     {Document cardiac monitor, telemetry assessment procedure when appropriate:1}  Medications Ordered in ED Medications  albuterol (PROVENTIL) (2.5 MG/3ML) 0.083% nebulizer solution 2.5 mg (  has no administration in time range)  HYDROmorphone (DILAUDID) tablet 4 mg (has no administration in time range)    ED Course/ Medical Decision Making/ A&P Clinical Course as of 09/07/23 1644  Wed Sep 07, 2023  1644 Dr. Marisue Humble from nephrology to arrange for dialysis. [RP]    Clinical Course User Index [RP] Rondel Baton, MD   {   Click here for ABCD2, HEART and other calculatorsREFRESH Note before signing :1}                              Medical Decision Making Amount and/or Complexity of Data Reviewed Radiology: ordered.  Risk Prescription drug management.   ***  {Document critical care time when appropriate:1} {Document review of labs and clinical decision tools ie heart score, Chads2Vasc2 etc:1}  {Document your independent review of radiology images, and any outside records:1} {Document your discussion with family members, caretakers, and with consultants:1} {Document social determinants of health affecting pt's care:1} {Document your decision making why or why not admission, treatments were needed:1} Final Clinical Impression(s) / ED Diagnoses Final diagnoses:  None    Rx / DC Orders ED Discharge Orders     None

## 2023-09-07 NOTE — Telephone Encounter (Signed)
Sending to [facility] for assistance.

## 2023-09-07 NOTE — ED Notes (Signed)
Assumed pt care from [facility]

## 2023-09-07 NOTE — Progress Notes (Signed)
Brief nephrology progress note  Patient discharged yesterday from nursing facility after admission 2/4 through 2/11 with volume overload after missing dialysis with difficulty getting to his outpatient center.  He discharged to SNF but came back today after calling 911 because the patient states the clinic did not have his medications.  His last dialysis was here on 09/05/2023.  He is due for dialysis today.  ED is working on disposition.  Dialysis to be ordered for today as able.  4 to 5 L ultrafiltration.  TDC.  4 hours.  No heparin, citrate lock the catheter.  Full consult note to follow tomorrow.

## 2023-09-07 NOTE — ED Triage Notes (Signed)
Pt via [facility] from [facility] c/o SOB and notes that he missed dialysis this morning. Normal HD schedule M/W/F. Pt wears 6L O2 at all times and feels increasingly SOB. He also notes severe 10/10 pain in bilateral lower legs and back, stating that he takes narcotics for pain management and has not received his meds today. He has also not had his eliquis today.

## 2023-09-07 NOTE — ED Provider Triage Note (Signed)
Emergency Medicine Provider Triage Evaluation Note  Phillip Young , a 38 y.o. male  was evaluated in triage.  Pt complains of difficulty breathing and diffuse bodyaches.  The patient tells me that he has chronic pain and that it has not been well-controlled recently.  Tells me that he takes narcotics but has not been able to take them for a couple days.  He does not really tell me why he has not been able to take them.  Denies cough or fever..  Review of Systems  Positive: SOB Negative:   Physical Exam  BP (!) 123/102 (BP Location: Left Arm)   Pulse 98   Temp 97.8 F (36.6 C) (Oral)   Resp (!) 24   Ht 6' (1.829 m)   Wt (!) 164.7 kg   SpO2 99%   BMI 49.23 kg/m  Gen:   Awake, no distress   Resp:  Normal effort  MSK:   Moves extremities without difficulty  Other:    Medical Decision Making  Medically screening exam initiated at 2:52 PM.  Appropriate orders placed.  Phillip Young was informed that the remainder of the evaluation will be completed by another provider, this initial triage assessment does not replace that evaluation, and the importance of remaining in the ED until their evaluation is complete.     Phillip Plan, DO 09/07/23 1453

## 2023-09-07 NOTE — ED Notes (Signed)
Got patient into a gown on the monitor patient is resting with call bell in reach

## 2023-09-08 ENCOUNTER — Observation Stay (HOSPITAL_COMMUNITY): Payer: Medicare Other

## 2023-09-08 ENCOUNTER — Other Ambulatory Visit: Payer: Medicare Other

## 2023-09-08 DIAGNOSIS — I5022 Chronic systolic (congestive) heart failure: Secondary | ICD-10-CM | POA: Diagnosis not present

## 2023-09-08 DIAGNOSIS — I959 Hypotension, unspecified: Secondary | ICD-10-CM

## 2023-09-08 DIAGNOSIS — J9611 Chronic respiratory failure with hypoxia: Secondary | ICD-10-CM | POA: Diagnosis present

## 2023-09-08 DIAGNOSIS — Z992 Dependence on renal dialysis: Secondary | ICD-10-CM | POA: Diagnosis not present

## 2023-09-08 DIAGNOSIS — G629 Polyneuropathy, unspecified: Secondary | ICD-10-CM | POA: Diagnosis not present

## 2023-09-08 DIAGNOSIS — I4891 Unspecified atrial fibrillation: Secondary | ICD-10-CM | POA: Diagnosis not present

## 2023-09-08 DIAGNOSIS — Z7985 Long-term (current) use of injectable non-insulin antidiabetic drugs: Secondary | ICD-10-CM | POA: Diagnosis not present

## 2023-09-08 DIAGNOSIS — D631 Anemia in chronic kidney disease: Secondary | ICD-10-CM | POA: Diagnosis present

## 2023-09-08 DIAGNOSIS — L89154 Pressure ulcer of sacral region, stage 4: Secondary | ICD-10-CM | POA: Diagnosis present

## 2023-09-08 DIAGNOSIS — I5084 End stage heart failure: Secondary | ICD-10-CM | POA: Diagnosis present

## 2023-09-08 DIAGNOSIS — Z7901 Long term (current) use of anticoagulants: Secondary | ICD-10-CM | POA: Diagnosis not present

## 2023-09-08 DIAGNOSIS — Z8249 Family history of ischemic heart disease and other diseases of the circulatory system: Secondary | ICD-10-CM | POA: Diagnosis not present

## 2023-09-08 DIAGNOSIS — Z79899 Other long term (current) drug therapy: Secondary | ICD-10-CM | POA: Diagnosis not present

## 2023-09-08 DIAGNOSIS — L600 Ingrowing nail: Secondary | ICD-10-CM

## 2023-09-08 DIAGNOSIS — I5042 Chronic combined systolic (congestive) and diastolic (congestive) heart failure: Secondary | ICD-10-CM

## 2023-09-08 DIAGNOSIS — E039 Hypothyroidism, unspecified: Secondary | ICD-10-CM | POA: Diagnosis present

## 2023-09-08 DIAGNOSIS — I953 Hypotension of hemodialysis: Secondary | ICD-10-CM | POA: Diagnosis present

## 2023-09-08 DIAGNOSIS — Z6841 Body Mass Index (BMI) 40.0 and over, adult: Secondary | ICD-10-CM | POA: Diagnosis not present

## 2023-09-08 DIAGNOSIS — N2581 Secondary hyperparathyroidism of renal origin: Secondary | ICD-10-CM | POA: Diagnosis present

## 2023-09-08 DIAGNOSIS — I428 Other cardiomyopathies: Secondary | ICD-10-CM | POA: Diagnosis present

## 2023-09-08 DIAGNOSIS — N186 End stage renal disease: Secondary | ICD-10-CM | POA: Diagnosis present

## 2023-09-08 DIAGNOSIS — G894 Chronic pain syndrome: Secondary | ICD-10-CM | POA: Diagnosis present

## 2023-09-08 DIAGNOSIS — I5043 Acute on chronic combined systolic (congestive) and diastolic (congestive) heart failure: Secondary | ICD-10-CM | POA: Diagnosis present

## 2023-09-08 DIAGNOSIS — Z79890 Hormone replacement therapy (postmenopausal): Secondary | ICD-10-CM | POA: Diagnosis not present

## 2023-09-08 DIAGNOSIS — I4821 Permanent atrial fibrillation: Secondary | ICD-10-CM | POA: Diagnosis present

## 2023-09-08 DIAGNOSIS — E1142 Type 2 diabetes mellitus with diabetic polyneuropathy: Secondary | ICD-10-CM | POA: Diagnosis present

## 2023-09-08 DIAGNOSIS — E662 Morbid (severe) obesity with alveolar hypoventilation: Secondary | ICD-10-CM | POA: Diagnosis present

## 2023-09-08 DIAGNOSIS — R0602 Shortness of breath: Secondary | ICD-10-CM | POA: Diagnosis present

## 2023-09-08 DIAGNOSIS — I132 Hypertensive heart and chronic kidney disease with heart failure and with stage 5 chronic kidney disease, or end stage renal disease: Secondary | ICD-10-CM | POA: Diagnosis present

## 2023-09-08 DIAGNOSIS — I5082 Biventricular heart failure: Secondary | ICD-10-CM | POA: Diagnosis present

## 2023-09-08 DIAGNOSIS — E1122 Type 2 diabetes mellitus with diabetic chronic kidney disease: Secondary | ICD-10-CM | POA: Diagnosis present

## 2023-09-08 LAB — RESPIRATORY PANEL BY PCR

## 2023-09-08 LAB — RENAL FUNCTION PANEL
Albumin: 3.3 g/dL — ABNORMAL LOW (ref 3.5–5.0)
Anion gap: 16 — ABNORMAL HIGH (ref 5–15)
BUN: 86 mg/dL — ABNORMAL HIGH (ref 6–20)
CO2: 20 mmol/L — ABNORMAL LOW (ref 22–32)
Calcium: 9.7 mg/dL (ref 8.9–10.3)
Chloride: 98 mmol/L (ref 98–111)
Creatinine, Ser: 10.33 mg/dL — ABNORMAL HIGH (ref 0.61–1.24)
GFR, Estimated: 6 mL/min — ABNORMAL LOW (ref >60)
Glucose, Bld: 132 mg/dL — ABNORMAL HIGH (ref 70–99)
Phosphorus: 6.3 mg/dL — ABNORMAL HIGH (ref 2.5–4.6)
Potassium: 4.7 mmol/L (ref 3.5–5.1)
Sodium: 134 mmol/L — ABNORMAL LOW (ref 135–145)

## 2023-09-08 LAB — CBC
HCT: 30.8 % — ABNORMAL LOW (ref 39.0–52.0)
Hemoglobin: 10 g/dL — ABNORMAL LOW (ref 13.0–17.0)
MCH: 33.8 pg (ref 26.0–34.0)
MCHC: 32.5 g/dL (ref 30.0–36.0)
MCV: 104.1 fL — ABNORMAL HIGH (ref 80.0–100.0)
Platelets: 176 10*3/uL (ref 150–400)
RBC: 2.96 MIL/uL — ABNORMAL LOW (ref 4.22–5.81)
RDW: 15.9 % — ABNORMAL HIGH (ref 11.5–15.5)
WBC: 7.8 10*3/uL (ref 4.0–10.5)
nRBC: 0 % (ref 0.0–0.2)

## 2023-09-08 LAB — MAGNESIUM: Magnesium: 2.1 mg/dL (ref 1.7–2.4)

## 2023-09-08 LAB — ECHOCARDIOGRAM COMPLETE
AR max vel: 1.92 cm2
AV Area VTI: 1.36 cm2
AV Area mean vel: 1.45 cm2
AV Mean grad: 1.5 mm[Hg]
AV Peak grad: 1.8 mm[Hg]
Ao pk vel: 0.67 m/s
Area-P 1/2: 7.44 cm2
Calc EF: 34.3 %
Est EF: <20
Height: 72 in
MV M vel: 4.15 m/s
MV Peak grad: 68.9 mm[Hg]
MV VTI: 1.44 cm2
S' Lateral: 5.7 cm
Single Plane A2C EF: 30.9 %
Single Plane A4C EF: 30.3 %
Weight: 5808 [oz_av]

## 2023-09-08 LAB — CBG MONITORING, ED: Glucose-Capillary: 121 mg/dL — ABNORMAL HIGH (ref 70–99)

## 2023-09-08 LAB — RESP PANEL BY RT-PCR (RSV, FLU A&B, COVID)  RVPGX2
Influenza A by PCR: NEGATIVE
Influenza B by PCR: NEGATIVE
Resp Syncytial Virus by PCR: NEGATIVE
SARS Coronavirus 2 by RT PCR: NEGATIVE

## 2023-09-08 LAB — TSH: TSH: 4.979 u[IU]/mL — ABNORMAL HIGH (ref 0.350–4.500)

## 2023-09-08 MED ORDER — OXYCODONE HCL 5 MG PO TABS
10.0000 mg | ORAL_TABLET | Freq: Four times a day (QID) | ORAL | Status: DC | PRN
  Administered 2023-09-08 – 2023-09-12 (×15): 10 mg via ORAL
  Filled 2023-09-08 (×16): qty 2

## 2023-09-08 MED ORDER — ALTEPLASE 2 MG IJ SOLR: 2.0000 mg | Freq: Once | INTRAMUSCULAR | Status: DC | PRN

## 2023-09-08 MED ORDER — LIDOCAINE-PRILOCAINE 2.5-2.5 % EX CREA: 1.0000 | TOPICAL_CREAM | CUTANEOUS | Status: DC | PRN

## 2023-09-08 MED ORDER — MIDODRINE HCL 5 MG PO TABS
30.0000 mg | ORAL_TABLET | Freq: Once | ORAL | Status: DC
  Filled 2023-09-08: qty 6

## 2023-09-08 MED ORDER — LIDOCAINE HCL (PF) 1 % IJ SOLN: 5.0000 mL | INTRAMUSCULAR | Status: DC | PRN

## 2023-09-08 MED ORDER — CHLORHEXIDINE GLUCONATE CLOTH 2 % EX PADS
6.0000 | MEDICATED_PAD | Freq: Every day | CUTANEOUS | Status: DC
Start: 2023-09-09 — End: 2023-09-13
  Administered 2023-09-09 – 2023-09-12 (×4): 6 via TOPICAL

## 2023-09-08 MED ORDER — ANTICOAGULANT SODIUM CITRATE 4% (200MG/5ML) IV SOLN
5.0000 mL | Status: DC | PRN
  Administered 2023-09-08: 5 mL
  Filled 2023-09-08: qty 5

## 2023-09-08 MED ORDER — PENTAFLUOROPROP-TETRAFLUOROETH EX AERO: 1.0000 | INHALATION_SPRAY | CUTANEOUS | Status: DC | PRN

## 2023-09-08 NOTE — ED Notes (Signed)
Patient declined his Midodrine this morning states " I am not taking that" advised his blood pressure was a little low and he would be leaving for dialysis soon, patient states " I'm not going to dialysis until I get my breakfast tray", offered other food, patient declined, wants his breakfast tray.

## 2023-09-08 NOTE — ED Notes (Signed)
Patient left in stable condition with his belongings and staff for dialysis.

## 2023-09-08 NOTE — Discharge Planning (Signed)
RNCM received call from peer regarding pt returning to [facility].  RNCM advised that pt would have to return to [facility] and ask for transfer from that facility.

## 2023-09-08 NOTE — Progress Notes (Signed)
   09/08/23 1955  Assess: MEWS Score  Temp 98.2 F (36.8 C)  BP (!) 143/92  MAP (mmHg) 106  ECG Heart Rate (!) 117  Resp 16  Level of Consciousness Alert  SpO2 96 %  O2 Device Nasal Cannula  Patient Activity (if Appropriate) In bed  O2 Flow Rate (L/min) 6 L/min  Assess: MEWS Score  MEWS Temp 0  MEWS Systolic 0  MEWS Pulse 2  MEWS RR 0  MEWS LOC 0  MEWS Score 2  MEWS Score Color Yellow  Assess: if the MEWS score is Yellow or Red  Were vital signs accurate and taken at a resting state? Yes  Does the patient meet 2 or more of the SIRS criteria? No  MEWS guidelines implemented  Yes, yellow  Treat  MEWS Interventions Considered administering scheduled or prn medications/treatments as ordered  Take Vital Signs  Increase Vital Sign Frequency  Yellow: Q2hr x1, continue Q4hrs until patient remains green for 12hrs  Escalate  MEWS: Escalate Yellow: Discuss with charge nurse and consider notifying provider and/or RRT  Notify: Charge Nurse/RN  Name of Charge Nurse/RN Notified [name], RN  Assess: SIRS CRITERIA  SIRS Temperature  0  SIRS Respirations  0  SIRS Pulse 1  SIRS WBC 0  SIRS Score Sum  1

## 2023-09-08 NOTE — Consult Note (Signed)
[facility] Renal Consultation Note    Indication for Consultation:  Management of ESRD/hemodialysis; anemia, hypertension/volume and secondary hyperparathyroidism  HQI:ONGEXB, Cristal Deer, DO  HPI: Nehemiah Mcfarren is a 38 y.o. male with ESRD on HD MWF at [facility]. He has a past medical history significant for HFrEF (EF = 30-35%), PAF (on Eliquis) s/p ablation (07/01/23), DM2 c/b peripheral neuropathy, hypothyroidism, morbid obesity, OSA, chronic hypoxic respiratory failure (bl 6L), HIT, hidradenitis suppurativa, and chronic pain syndrome (on opioids).  Mr. Walthall was recently admitted 08/30/23-09/06/23 for volume overload after missing hemodialysis 2nd transportation issues. He then was discharged to a SNF. Apparently, he called 911 from the SNF yesterday after having a panic attack because he was not receiving his medications. I spoke with our Renal Navigator this morning who informed me that [name] called the case manager here expressing concern of the facility saying he's not receiving adequate care and noted an insect infestation. Patient presented to the ED SOB and found to be in Afib with RVR. Cardiology is on board. Patient missed his scheduled hemodialysis yesterday. Recent CXR showed mild vascular congestion. Seen and examined patient on HD. He denies SOB, CP, and N/V. UFG set 5-6L. Unfortunately, he refused his scheduled Midodrine this morning so we will try to remove as much fluid as we can. Plan for another HD tomorrow to get him back on his routine schedule.   Past Medical History:  Diagnosis Date   Acute on chronic respiratory failure with hypoxia ([name]) 04/21/2021   Acute on chronic systolic (congestive) heart failure ([name]) 02/26/2020   Amiodarone toxicity    Anemia    Atrial flutter ([name])    Biventricular congestive heart failure ([name])    Chronic hypoxemic respiratory failure ([name])    Class 3 severe obesity due to excess calories with serious  comorbidity and body mass index (BMI) of 50.0 to 59.9 in adult [facility]) 02/26/2020   Essential hypertension 02/26/2020   GERD without esophagitis 02/26/2020   Hidradenitis suppurativa 02/26/2020   NICM (nonischemic cardiomyopathy) ([name])    Obesity hypoventilation syndrome ([name])    OSA (obstructive sleep apnea)    PAF (paroxysmal atrial fibrillation) ([name])    Pneumonia    Prediabetes 02/26/2020   Past Surgical History:  Procedure Laterality Date   ABSCESS DRAINAGE     AV FISTULA PLACEMENT Left 08/21/2021   Procedure: LEFT ARM ARTERIOVENOUS (AV) FISTULA.;  Surgeon: Nada Libman, MD;  Location: [facility] OR;  Service: Vascular;  Laterality: Left;   CARDIAC CATHETERIZATION     CARDIOVERSION N/A 10/09/2021   Procedure: CARDIOVERSION;  Surgeon: Laurey Morale, MD;  Location: [facility] ENDOSCOPY;  Service: Cardiovascular;  Laterality: N/A;   CARDIOVERSION N/A 05/28/2022   Procedure: CARDIOVERSION;  Surgeon: Laurey Morale, MD;  Location: [facility] ENDOSCOPY;  Service: Cardiovascular;  Laterality: N/A;   CARDIOVERSION N/A 06/07/2022   Procedure: CARDIOVERSION;  Surgeon: Laurey Morale, MD;  Location: [facility] ENDOSCOPY;  Service: Cardiovascular;  Laterality: N/A;   IR FLUORO GUIDE CV LINE RIGHT  03/10/2021   IR FLUORO GUIDE CV LINE RIGHT  04/22/2021   IR FLUORO GUIDE CV LINE RIGHT  08/20/2021   IR FLUORO GUIDE CV LINE RIGHT  03/01/2023   IR FLUORO GUIDE CV LINE RIGHT  03/09/2023   IR FLUORO GUIDE CV LINE RIGHT  05/30/2023   IR PTA VENOUS EXCEPT DIALYSIS CIRCUIT  03/09/2023   IR REMOVAL TUN CV CATH W/O FL  05/26/2023   IR REMOVE CV FIBRIN SHEATH  03/09/2023  IR US GUIDE VASC ACCESS RIGHT  03/10/2021   IR US GUIDE VASC ACCESS RIGHT  04/22/2021   IR US GUIDE VASC ACCESS RIGHT  05/30/2023   RIGHT HEART CATH N/A 03/06/2021   Procedure: RIGHT HEART CATH;  Surgeon: Dolores Patty, MD;  Location: [facility] INVASIVE CV LAB;  Service: Cardiovascular;  Laterality: N/A;   RIGHT/LEFT HEART CATH AND CORONARY ANGIOGRAPHY N/A  03/04/2020   Procedure: RIGHT/LEFT HEART CATH AND CORONARY ANGIOGRAPHY;  Surgeon: Laurey Morale, MD;  Location: [facility] INVASIVE CV LAB;  Service: Cardiovascular;  Laterality: N/A;   TEE WITHOUT CARDIOVERSION N/A 05/05/2021   Procedure: TRANSESOPHAGEAL ECHOCARDIOGRAM (TEE);  Surgeon: Laurey Morale, MD;  Location: [facility] ENDOSCOPY;  Service: Cardiovascular;  Laterality: N/A;   TEMPORARY DIALYSIS CATHETER  03/06/2021   Procedure: TEMPORARY DIALYSIS CATHETER;  Surgeon: Dolores Patty, MD;  Location: [facility] INVASIVE CV LAB;  Service: Cardiovascular;;   Family History  Problem Relation Age of Onset   Heart disease Mother    Hypertension Mother    Pulmonary Hypertension Mother    Drug abuse Father        died due to Heroin overdose   Social History:  reports that he quit smoking about 6 years ago. His smoking use included cigarettes. He has been exposed to tobacco smoke. He has quit using smokeless tobacco. He reports that he does not drink alcohol and does not use drugs. 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, SRA positive   Metoprolol Other (See Comments)    near syncope   Amoxicillin Other (See Comments)    Was hospitalized    Other Swelling and Other (See Comments)    Steroids Fluid seeping out of legs    Prior to Admission medications   Medication Sig Start Date End Date Taking? Authorizing Provider  acetaminophen (TYLENOL) 500 MG tablet Take 2 tablets (1,000 mg total) by mouth every 6 (six) hours. 09/06/23  Yes Champ Mungo, DO  apixaban (ELIQUIS) 5 MG TABS tablet Take 1 tablet (5 mg total) by mouth 2 (two) times daily. 06/03/23  Yes [name], DO  cholecalciferol (VITAMIN D3) 10 MCG (400 UNIT) TABS tablet Take 1 tablet (400 Units total) by mouth daily. 06/20/23  Yes  Setzer, Lynnell Jude, PA-C  cinacalcet (SENSIPAR) 30 MG tablet Take 1 tablet (30 mg total) by mouth Every Tuesday,Thursday,and Saturday with dialysis. 09/06/23  Yes Champ Mungo, DO  digoxin (LANOXIN) 0.125 MG tablet Take 1 tablet (0.125 mg total) by mouth 3 (three) times a week. Monday, Wednesday, Friday (dialysis days) 07/22/23  Yes Rocky Morel, DO  HYDROmorphone (DILAUDID) 4 MG tablet Take 4 mg by mouth every 6 (six) hours as needed for severe pain (pain score 7-10).   Yes [provider]  levothyroxine (SYNTHROID) 25 MCG tablet Take 1 tablet (25 mcg total) by mouth daily at 6 (six) AM. 07/21/23  Yes Rocky Morel, DO  lidocaine (XYLOCAINE) 5 % ointment Apply 1 Application topically as needed. Patient taking differently: Apply 1 Application topically as needed (for port access). 06/30/23  Yes Juberg, Cristal Deer, DO  midodrine (PROAMATINE) 10 MG tablet Take 3 tablets (30 mg total) by mouth 3 (three) times daily with meals. 06/20/23  Yes Setzer, Lynnell Jude, PA-C  mupirocin cream (BACTROBAN) 2 % Apply topically 2 (two)  times daily for 7 days. 09/06/23 09/13/23 Yes Champ Mungo, DO  pregabalin (LYRICA) 50 MG capsule Take 1 capsule (50 mg total) by mouth daily. Take one capsule daily. 07/25/23  Yes Champ Mungo, DO  sevelamer carbonate (RENVELA) 800 MG tablet Take 2 tablets (1,600 mg total) by mouth 3 (three) times daily with meals. 09/06/23  Yes Champ Mungo, DO  tirzepatide (ZEPBOUND) 2.5 MG/0.5ML injection vial Inject 2.5 mg into the skin once a week. 08/25/23  Yes Lovie Macadamia, MD  venlafaxine XR (EFFEXOR XR) 37.5 MG 24 hr capsule Take 1 capsule (37.5 mg total) by mouth daily. 07/07/23 07/06/24 Yes [name], DO  Darbepoetin Alfa (ARANESP) 150 MCG/0.3ML SOSY injection To be given during dialysis by [facility] per Nephrology. Not to be given to patient at SNF. 09/06/23   Lovie Macadamia, MD  Nystatin (GERHARDT'S BUTT CREAM) CREA Apply 1 Application topically 3 (three)  times daily. Patient not taking: Reported on 09/07/2023 05/24/23   [name], DO   Current Facility-Administered Medications  Medication Dose Route Frequency Provider Last Rate Last Admin   acetaminophen (TYLENOL) tablet 1,000 mg  1,000 mg Oral Q6H Juberg, Christopher, DO   1,000 mg at 09/08/23 0518   albuterol (PROVENTIL) (2.5 MG/3ML) 0.083% nebulizer solution 2.5 mg  2.5 mg Inhalation Q2H PRN Rondel Baton, MD       alteplase (CATHFLO ACTIVASE) injection 2 mg  2 mg Intracatheter Once PRN Arita Miss, MD       anticoagulant sodium citrate solution 5 mL  5 mL Intracatheter PRN Arita Miss, MD       apixaban Everlene Balls) tablet 5 mg  5 mg Oral BID [name], DO   5 mg at 09/08/23 0001   Chlorhexidine Gluconate Cloth 2 % PADS 6 each  6 each Topical Q0600 [name], DO       cholecalciferol (VITAMIN D3) 10 MCG (400 UNIT) tablet 400 Units  400 Units Oral Daily [name], DO       cinacalcet (SENSIPAR) tablet 30 mg  30 mg Oral Q T,Th,Sa-HD [name], DO       [START ON 09/09/2023] digoxin (LANOXIN) tablet 0.125 mg  0.125 mg Oral Once per day on Monday Wednesday Friday [name], DO       guaiFENesin (ROBITUSSIN) 100 MG/5ML liquid 5 mL  5 mL Oral Q4H PRN [name], DO       HYDROmorphone (DILAUDID) tablet 4 mg  4 mg Oral STAT Rondel Baton, MD       HYDROmorphone (DILAUDID) tablet 4 mg  4 mg Oral Q6H PRN [name], DO   4 mg at 09/08/23 1610   levothyroxine (SYNTHROID) tablet 25 mcg  25 mcg Oral Q0600 [name], DO   25 mcg at 09/08/23 0518   lidocaine (PF) (XYLOCAINE) 1 % injection 5 mL  5 mL Intradermal PRN Berenda Morale, NP       lidocaine (XYLOCAINE) 5 % ointment 1 Application  1 Application Topical PRN [name], DO       lidocaine-prilocaine (EMLA) cream 1 Application  1 Application Topical PRN Berenda Morale, NP       menthol-cetylpyridinium (CEPACOL) lozenge 3 mg  1  lozenge Oral PRN [name], DO       midodrine (PROAMATINE) tablet 30 mg  30 mg Oral TID WC Juberg, Christopher, DO       midodrine (PROAMATINE) tablet 30 mg  30 mg Oral Once in dialysis Berenda Morale, NP  mupirocin ointment (BACTROBAN) 2 %   Topical BID [name], DO       Oral care mouth rinse  15 mL Mouth Rinse PRN [name], DO       pentafluoroprop-tetrafluoroeth (GEBAUERS) aerosol 1 Application  1 Application Topical PRN Berenda Morale, NP       pregabalin (LYRICA) capsule 50 mg  50 mg Oral Daily Juberg, Christopher, DO       sevelamer carbonate (RENVELA) tablet 1,600 mg  1,600 mg Oral TID WC Juberg, Christopher, DO       triamcinolone ointment (KENALOG) 0.1 %   Topical BID [name], DO       venlafaxine XR (EFFEXOR-XR) 24 hr capsule 37.5 mg  37.5 mg Oral Daily [name], DO       Labs: Basic Metabolic Panel: Recent Labs  Lab 09/05/23 0343 09/06/23 0335 09/07/23 1452 09/08/23 0339  NA 137 137 134* 134*  K 5.0 4.6 4.9 4.7  CL 97* 95* 98 98  CO2 24 24 18* 20*  GLUCOSE 107* 87 112* 132*  BUN 60* 51* 81* 86*  CREATININE 9.53* 7.36* 9.84* 10.33*  CALCIUM 10.6* 10.3 9.6 9.7  PHOS 5.9* 5.2*  --  6.3*   Liver Function Tests: Recent Labs  Lab 09/06/23 0335 09/07/23 1452 09/08/23 0339  AST  --  26  --   ALT  --  18  --   ALKPHOS  --  125  --   BILITOT  --  1.1  --   PROT  --  6.8  --   ALBUMIN 3.4* 3.3* 3.3*   No results for input(s): "LIPASE", "AMYLASE" in the last 168 hours. No results for input(s): "AMMONIA" in the last 168 hours. CBC: Recent Labs  Lab 09/02/23 0134 09/03/23 0457 09/05/23 0343 09/07/23 1452 09/08/23 0339  WBC 7.9 7.9 7.7 9.2 7.8  NEUTROABS  --  5.7  --  6.4  --   HGB 9.9* 9.8* 10.1* 9.6* 10.0*  HCT 30.3* 30.2* 30.8* 31.8* 30.8*  MCV 102.4* 103.1* 103.7* 110.8* 104.1*  PLT 206 225 203 182 176   Cardiac Enzymes: No results for input(s): "CKTOTAL", "CKMB", "CKMBINDEX",  "TROPONINI" in the last 168 hours. CBG: Recent Labs  Lab 09/05/23 2009 09/06/23 0723 09/06/23 1131 09/06/23 1708 09/08/23 0751  GLUCAP 104* 107* 104* 135* 121*   Iron Studies: No results for input(s): "IRON", "TIBC", "TRANSFERRIN", "FERRITIN" in the last 72 hours. Studies/Results: ECHOCARDIOGRAM COMPLETE Result Date: 09/08/2023    ECHOCARDIOGRAM REPORT   Patient Name:   Phillip Young Date of Exam: 09/08/2023 Medical Rec #:  [id number]      Height:       72.0 in Accession #:    [id number]     Weight:       363.0 lb Date of Birth:  [birth year]       BSA:          2.745 m Patient Age:    37 years       BP:           111/84 mmHg Patient Gender: M              HR:           98 bpm. Exam Location:  Inpatient Procedure: 2D Echo, Cardiac Doppler and Color Doppler (Both Spectral and Color            Flow Doppler were utilized during procedure). Indications:     Atrial fibrillation  History:  Patient has prior history of Echocardiogram examinations, most                  recent 05/26/2023. CHF, COPD, Arrythmias:Atrial Flutter and                  Atrial Fibrillation, Signs/Symptoms:Shortness of Breath and                  Hypotension; Risk Factors:Diabetes and Sleep Apnea. ESRD.  Sonographer:     [name] Referring Phys:  [id number] Karl Ito Diagnosing Phys: Clearnce Hasten  Sonographer Comments: Image acquisition challenging due to patient body habitus, Image acquisition challenging due to uncooperative patient and patient persistently moving. IMPRESSIONS  1. Based on VTI and aortic valve excursion cardiac output is severely reduced.. Left ventricular ejection fraction, by estimation, is <20%. The left ventricle has severely decreased function. The left ventricle has global hypokinesis. The left ventricular internal cavity size was moderately dilated. Left ventricular diastolic function could not be evaluated due to atrial fibrillation but is likely abnormal.  2. Right ventricular systolic function  is moderately reduced. The right ventricular size is mildly enlarged. There is mildly elevated pulmonary artery systolic pressure.  3. Left atrial size was moderately dilated.  4. The mitral valve is normal in structure. Mild mitral valve regurgitation. No evidence of mitral stenosis.  5. Tricuspid valve regurgitation is moderate.  6. The aortic valve is normal in structure. Aortic valve regurgitation is not visualized. No aortic stenosis is present.  7. Severely dilated pulmonary artery. FINDINGS  Left Ventricle: Based on VTI and aortic valve excursion cardiac output is severely reduced. Left ventricular ejection fraction, by estimation, is <20%. The left ventricle has severely decreased function. The left ventricle demonstrates global hypokinesis. The left ventricular internal cavity size was moderately dilated. There is no left ventricular hypertrophy. Left ventricular diastolic function could not be evaluated due to atrial fibrillation. Left ventricular diastolic function could not be evaluated. Right Ventricle: The right ventricular size is mildly enlarged. No increase in right ventricular wall thickness. Right ventricular systolic function is moderately reduced. There is mildly elevated pulmonary artery systolic pressure. The tricuspid regurgitant velocity is 2.92 m/s, and with an assumed right atrial pressure of 8 mmHg, the estimated right ventricular systolic pressure is 42.1 mmHg. Left Atrium: Left atrial size was moderately dilated. Right Atrium: Right atrial size was normal in size. Pericardium: There is no evidence of pericardial effusion. Mitral Valve: The mitral valve is normal in structure. Severely decreased mobility of the mitral valve leaflets. Mild mitral valve regurgitation. No evidence of mitral valve stenosis. MV peak gradient, 4.3 mmHg. The mean mitral valve gradient is 2.0 mmHg. Tricuspid Valve: The tricuspid valve is normal in structure. Tricuspid valve regurgitation is moderate . No  evidence of tricuspid stenosis. Aortic Valve: The aortic valve is normal in structure. Aortic valve regurgitation is not visualized. No aortic stenosis is present. Aortic valve mean gradient measures 1.5 mmHg. Aortic valve peak gradient measures 1.8 mmHg. Aortic valve area, by VTI measures 1.36 cm. Pulmonic Valve: The pulmonic valve was normal in structure. Pulmonic valve regurgitation is trivial. No evidence of pulmonic stenosis. Aorta: The aortic root and ascending aorta are structurally normal, with no evidence of dilitation. Pulmonary Artery: The pulmonary artery is severely dilated. Venous: The inferior vena cava was not well visualized. IAS/Shunts: No atrial level shunt detected by color flow Doppler. Additional Comments: 3D imaging was not performed.  LEFT VENTRICLE PLAX 2D LVIDd:  6.40 cm LVIDs:         5.70 cm LV PW:         1.20 cm LV IVS:        0.80 cm LVOT diam:     1.80 cm LV SV:         15 LV SV Index:   5 LVOT Area:     2.54 cm  LV Volumes (MOD) LV vol d, MOD A2C: 259.0 ml LV vol d, MOD A4C: 125.0 ml LV vol s, MOD A2C: 179.0 ml LV vol s, MOD A4C: 87.1 ml LV SV MOD A2C:     80.0 ml LV SV MOD A4C:     125.0 ml LV SV MOD BP:      71.3 ml RIGHT VENTRICLE            IVC RV Basal diam:  4.00 cm    IVC diam: 1.70 cm RV Mid diam:    3.20 cm RV S prime:     8.49 cm/s TAPSE (M-mode): 1.9 cm LEFT ATRIUM            Index        RIGHT ATRIUM           Index LA diam:      5.10 cm  1.86 cm/m   RA Area:     17.80 cm LA Vol (A2C): 78.4 ml  28.56 ml/m  RA Volume:   43.40 ml  15.81 ml/m LA Vol (A4C): 120.0 ml 43.72 ml/m  AORTIC VALVE                    PULMONIC VALVE AV Area (Vmax):    1.92 cm     PV Vmax:       0.70 m/s AV Area (Vmean):   1.45 cm     PV Peak grad:  2.0 mmHg AV Area (VTI):     1.36 cm AV Vmax:           67.35 cm/s AV Vmean:          51.550 cm/s AV VTI:            0.109 m AV Peak Grad:      1.8 mmHg AV Mean Grad:      1.5 mmHg LVOT Vmax:         50.70 cm/s LVOT Vmean:        29.300 cm/s  LVOT VTI:          0.058 m LVOT/AV VTI ratio: 0.53  AORTA Ao Root diam: 3.00 cm Ao Asc diam:  2.80 cm MITRAL VALVE               TRICUSPID VALVE MV Area (PHT): 7.44 cm    TR Peak grad:   34.1 mmHg MV Area VTI:   1.44 cm    TR Vmax:        292.00 cm/s MV Peak grad:  4.3 mmHg MV Mean grad:  2.0 mmHg    SHUNTS MV Vmax:       1.04 m/s    Systemic VTI:  0.06 m MV Vmean:      65.4 cm/s   Systemic Diam: 1.80 cm MV Decel Time: 102 msec MR Peak grad: 68.9 mmHg MR Mean grad: 46.0 mmHg MR Vmax:      415.00 cm/s MR Vmean:     319.0 cm/s MV E velocity: 57.90 cm/s Clearnce Hasten Electronically signed by Clearnce Hasten Signature Date/Time: 09/08/2023/8:30:13 AM  Final (Updated)    DG Toe Great Right Result Date: 09/08/2023 CLINICAL DATA:  Paronychia of great toe EXAM: RIGHT GREAT TOE COMPARISON:  None Available. FINDINGS: There is no evidence of fracture or dislocation. There is no evidence of arthropathy or other focal bone abnormality. Soft tissues are unremarkable. IMPRESSION: Negative. Electronically Signed   By: Charlett Nose M.D.   On: 09/08/2023 00:20   DG Chest Port 1 View Result Date: 09/07/2023 CLINICAL DATA:  Shortness of breath. EXAM: PORTABLE CHEST 1 VIEW COMPARISON:  08/30/2023, additional priors reviewed FINDINGS: Patient had difficulty with the exam, both costophrenic angles are not included in the field of view. Right-sided dialysis catheter in place. Chronic cardiomegaly. Unchanged mediastinal contours, globular appearance of the heart is a chronic finding. Mild vascular congestion. No confluent consolidation. Trace fluid in the fissures without significant sub pulmonic effusion. No pneumothorax. IMPRESSION: Chronic cardiomegaly. Mild vascular congestion. Trace fluid in the fissures without significant sub pulmonic effusion. Electronically Signed   By: Narda Rutherford M.D.   On: 09/07/2023 17:08    ROS: All others negative except those listed in HPI.   Physical Exam: Vitals:   09/08/23 0615  09/08/23 0741 09/08/23 0743 09/08/23 0927  BP:    107/83  Pulse: 97   (!) 107  Resp:    (!) 22  Temp:   98.2 F (36.8 C) (!) 97.4 F (36.3 C)  TempSrc:  Oral  Oral  SpO2:    99%  Weight:      Height:         General: Awake, alert, obese, on O2, NAD Head: Sclera not icteric MMM Lungs: Clear anteriorly. No wheeze, rales or rhonchi. Breathing is unlabored. Heart: RRR. No murmur, rubs or gallops.  Abdomen: Large, soft, non-tender  Lower extremities: Diffuse LE edema Neuro: AAOx3. Dialysis Access: R internal jugular TDC  Dialysis Orders:   [facility] MWF From dec 2024 -->  4.5h   2/2 bath  155.5kg  RIJ TDC  Heparin none Micera every 2 weeks - last dose 08/24/23 Venofer 100mg  load - last dose 08/24/23. Last dose scheduled for 09/02/23   Renal-related home meds: - sensipar 30 every day - fosrenol 1 gm ac tid - midodrine 30 tid - others: effexor xr, lyrica, T4, digoxin, eliquis   Last Labs: Cr 10.33, BUN 86, Na 134, Hgb 10, K 4.7, Ca 9.7, P 6.3, Alb 3.3  Assessment/Plan: Afib with RVR - On digoxin and Eliquis. Not a candidate for amiodarone or AV nodal ablation with PPM. Had multiple failed attempts at [facility]. Cardiology following. Chronic HFrEF - EF 30-35%. Manage fluid with HD Chronic hypoxic respiratory failure - on home O2. Will continue this here.  Chronic pain - On Tylenol, Lyrica, Dilaudid PRN, and Venlafaxine. Managed by primary ESRD -  on HD MWF. On HD and plan for treatment tomorrow to get him back on schedule. Chronic hypotension/volume  - On Midodrine 30mg  TID but he refused his dose this AM. Continue to encourage regimen. Push UF as tolerated. Anemia of CKD - Hgb 10. Last dose given on 1/29. Will start ESA today Secondary Hyperparathyroidism -  Continue Sensipar and Renvela. Nutrition - Renal diet with fluid restriction. Advised to limit outside foods Dispo - Likely will need to find another SNF for him.  Salome Holmes, NP [facility] 09/08/2023,  9:58 AM

## 2023-09-08 NOTE — Hospital Course (Addendum)
Phillip Young is a 38 y.o. malewith multiple significant chronic conditions including ESRD with HD, HFrEF, Atrial fibrillation on Eliquis and chronic digoxin, Chronic respiratory failure 2/2 amiodarone toxicity, BMI 47, T2DM, and polyneuropathy returning to [facility] from SNF d/t SOB after they were ostensibly unable to provide patient with medications / dialysis. He did undergo avulsion of the right great toenail d/t ingrown nail with superficial infection. His respiratory status and swelling continue to improve during his stay and will go to rehab facility to continue to work on strength.   Atrial fibrillation with RVR Cardiology consulted. Felt that RVR is 2-2 atrial stretch in setting of volume overload. Per cards - not a candidate for ablation, and medical management limited due to comorbidity.  -Digoxin 0.125 mg 3 times weekly, Monday, Wednesday, Fridays -Eliquis 5 mg twice daily - Continue HD w/ high UF goals to optimize volume.   ESRD on HD MWF Chronic hypotension ESRD anemia On MWF schedule. Better able to tolerate Midodrine this admission. Persistently high UF. Discussed fluid restriction and diet.  - Appreciate nephrology's management - Continue midodrine - Vit D supplementation - Continue Cinacalcet 30 mg every Tuesday, Thursday, Saturday - Continue Renvela 1600 mg 3 times daily with meals   Disposition Planning Functional deconditioning BMI 47 Per patient - was unable to receive proper care at [facility] and returned after missed medications / dialysis. Improving SOB. He is accepted at [facility] and has had a good experience there prior.  - Appreciate SW continued efforts - Continue to engage with PT   Chronic hypoxic respiratory failure Suspected secondary to amiodarone toxicity Home supplemental O2, 2-4L. No crackles on exam, improving orthopnea, dyspnea.  - Wean with goal O2 sat >90%   AoCHRF HFrEF [EF <20%]  Last EF 30-35% in October 2024 with moderately decreased LV  function and global hypokinesis. Echo this admission with challenging views, however EF estimated at <20% with global hypokinesis. GDMT limited due to hypotension. No crackles, endorses orthopnea - Continue with HD - Continue with Digoxin - Cardiology consulted in ED d/t a-fib w/ RVR.   Chronic somatic and neuropathic pain Chronic opiate therapy - Scheduled Tylenol 100 mg QID  - Venlafaxine 37.5mg  daily  - Lyrica 50mg  daily  - Oxycodone 10mg  q6hrs PRN    Sacral decubitus ulcer, stage IV - Foam dressing needs to be in place over sacral area.    Hidradenitis Suppurativa  No active treatment and continues to endorse pain, which is exacerbated by limited mobility in bed - Tylenol 1000 mg q6hrs PRN  - Triamcinolone ointment over left lateral thigh  - Cover with foam dressing.    Hypothyroidism Repeat TSH today slightly increased. Prior TSH and FT4 within range. May reflect euthyroid sick. Repeat outpatient. - Continue Levothyroxine 25 mcg daily   R great toe wound, c/f ingrown toenail Noted at discharge 09/06/23. Toenail avulsion with podiatry tolerated well, help appreciated.  - Podiatry wound care recommendations:  " Begin daily epsom salt soak followed by abx ointment to the nail bed and band aid application x 2 weeks. "

## 2023-09-08 NOTE — Plan of Care (Signed)
Problem: Activity: Goal: Risk for activity intolerance will decrease Outcome: Progressing   Problem: Coping: Goal: Level of anxiety will decrease Outcome: Progressing   Problem: Pain Managment: Goal: General experience of comfort will improve and/or be controlled Outcome: Progressing   Problem: Skin Integrity: Goal: Risk for impaired skin integrity will decrease Outcome: Progressing

## 2023-09-08 NOTE — Progress Notes (Addendum)
   Patient Name: Phillip Young Date of Encounter: 09/08/2023 [facility] Cardiologist: Marca Ancona, MD   Interval Summary  .    Patient seen in HD today. Reports marginal improvement in sensation of rapid HR this morning. He describes noticing rapid HR about an hour before being brought in to the ED.   Vital Signs .    Vitals:   09/08/23 0600 09/08/23 0615 09/08/23 0741 09/08/23 0743  BP: 111/84     Pulse:  97    Resp:      Temp:    98.2 F (36.8 C)  TempSrc:   Oral   SpO2: 100%     Weight:      Height:        Intake/Output Summary (Last 24 hours) at 09/08/2023 6387 Last data filed at 09/08/2023 0741 Gross per 24 hour  Intake --  Output 200 ml  Net -200 ml      09/07/2023    2:39 PM 09/05/2023    7:33 PM 09/05/2023    3:15 PM  Last 3 Weights  Weight (lbs) 363 lb 362 lb 14 oz 372 lb 12.8 oz  Weight (kg) 164.656 kg 164.6 kg 169.1 kg      Telemetry/ECG    Persistent atrial flutter with variable conduction, ventricular rates averaging low 100s - Personally Reviewed  Physical Exam .   GEN: obese male, no acute distress Neck: unable to assess due to body habitus Cardiac: irregularly irregular, 2/6 systolic murmur over apex  Respiratory: Clear to auscultation bilaterally (exam limited to anterior only due to active HD session) GI: Soft, nontender MS: minimal LE edema. Patient's left lower leg and foot exquisitely tender to touch  Assessment & Plan .     Phillip Young is a 38 y.o. male with a hx of HFrEF (EF = 30-35%), PAF (on Eliquis) s/p ablation (07/01/23), DM2 c/b peripheral neuropathy, hypothyroidism, morbid obesity, OSA, chronic hypoxic respiratory failure (bl 6L), HIT, hidradenitis suppurativa, and chronic pain syndrome (on opioids) who is being seen 09/07/2023 for the evaluation of atrial fibrillation with RVR at the request of Dr. Eloise Harman.   Atrial fibrillation/flutter with RVR Patient with acute dyspnea found to be in atrial flutter with RVR on  admission. Review of notes shows that previous rate/rhythm control has been severely limited by drug intolerances as well as due to co-morbidities including ESRD. Rates reasonably well controlled this morning, low 100s.  Continue Mon/Weds/Fri digoxin Continue Eliquis Low BP this morning prevents addition of beta blocker Given intolerance and ESRD, extremely limited options for rate/rhythm control. If rates continue to be elevated, would consult EP. Amiodarone has previously been correlated with patient's respiratory failure 2/2 toxicity.  Acute on chronic HFrEF Acute on chronic respiratory failure Patient with chronic HFrEF presented to the ED with worsening dyspnea. Missed HD on 2/12. Volume status difficult to assess due to body habitus but does appear hypervolemic. Echo completed this admission but limited images 2/2 lack of cooperation. EF estimated <20%. Severely dilated pulmonary artery supports suspicion of hypervolemia. HD today. Fluid removal may be limited by hypotension. Patient apparently declined his Midodrine today.  GDMT very restricted by co-morbidities  Given significant co-morbidities, recommend palliative care evaluation.  Per primary team: ESRD Chronic pain Hypothyroidism  For questions or updates, please contact [facility] Please consult [email or web address] for contact info under        Signed, Perlie Gold, PA-C

## 2023-09-08 NOTE — Progress Notes (Addendum)
PT Cancellation Note  Patient Details Name: Phillip Young MRN: [id number] DOB: 12-04-1985   Cancelled Treatment:    Reason Eval/Treat Not Completed: Patient at procedure or test/unavailable (Pt off unit at HD, will follow up at later date/time as pt able and schedule allows.)  Re-attempted eval in PM and pt speaking with MD, pt then on phone and declined requesting therapist return at another time.    [name], DPT Acute Rehabilitation Services Office [phone]  09/08/23 3:24 PM

## 2023-09-08 NOTE — Progress Notes (Addendum)
HD#0 Subjective:   Summary: Phillip Young is a 38 y.o. malewith multiple significant chronic conditions including ESRD with HD, HFrEF, Atrial fibrillation on Eliquis and chronic digoxin, Chronic respiratory failure 2/2 amiodarone toxicity, BMI 47, T2DM, and polyneuropathy returning from SNF d/t SOB after they were ostensibly unable to provide patient with medications / dialysis.  Overnight Events: None  Feeling better today. Sitting in bed and eating when seen. Still having pain in his feet and legs. Discussed importance of trying to maintain mobility from recent inpatient rehab admission. Still increased SOB, and orthopnea. States he is still having some purulent drainage from right great toe.   Objective:  Vital signs in last 24 hours: Vitals:   09/08/23 1145 09/08/23 1245 09/08/23 1258 09/08/23 1300  BP: 112/87 (!) 128/115 97/84   Pulse:      Resp:      Temp:    97.8 F (36.6 C)  TempSrc:    Oral  SpO2:      Weight:      Height:       Supplemental O2: Nasal Cannula SpO2: 99 % O2 Flow Rate (L/min): 6 L/min   Physical Exam:  Constitutional: In no acute distress Cardiovascular:irregular rate and rhythm Pulmonary/Chest: Clear to auscultation bilaterally. No appreciable crackles.  Abdominal: soft, non-tender MSK: Anterior R chest with HD cath in place without surrounding erythema Neurological: alert & oriented  Skin: warm and dry; right great toe with localized area of swelling medial to the toenail with expressible purulence Psych: Pleasant mood and affect  Filed Weights   09/07/23 1439  Weight: (!) 164.7 kg      Intake/Output Summary (Last 24 hours) at 09/08/2023 1420 Last data filed at 09/08/2023 1300 Gross per 24 hour  Intake --  Output 3400 ml  Net -3400 ml   Net IO Since Admission: -3,400 mL [09/08/23 1420]  Pertinent Labs:    Latest Ref Rng & Units 09/08/2023    3:39 AM 09/07/2023    2:52 PM 09/05/2023    3:43 AM  CBC  WBC 4.0 - 10.5 K/uL 7.8  9.2   7.7   Hemoglobin 13.0 - 17.0 g/dL 54.0  9.6  98.1   Hematocrit 39.0 - 52.0 % 30.8  31.8  30.8   Platelets 150 - 400 K/uL 176  182  203        Latest Ref Rng & Units 09/08/2023    3:39 AM 09/07/2023    2:52 PM 09/06/2023    3:35 AM  CMP  Glucose 70 - 99 mg/dL 191  478  87   BUN 6 - 20 mg/dL 86  81  51   Creatinine 0.61 - 1.24 mg/dL 29.56  2.13  0.86   Sodium 135 - 145 mmol/L 134  134  137   Potassium 3.5 - 5.1 mmol/L 4.7  4.9  4.6   Chloride 98 - 111 mmol/L 98  98  95   CO2 22 - 32 mmol/L 20  18  24    Calcium 8.9 - 10.3 mg/dL 9.7  9.6  57.8   Total Protein 6.5 - 8.1 g/dL  6.8    Total Bilirubin 0.0 - 1.2 mg/dL  1.1    Alkaline Phos 38 - 126 U/L  125    AST 15 - 41 U/L  26    ALT 0 - 44 U/L  18      Assessment/Plan:   Principal Problem:   Atrial fibrillation with RVR ([name]) Active Problems:  Morbid obesity ([name])   OSA (obstructive sleep apnea)   Hidradenitis suppurativa   Chronic systolic CHF (congestive heart failure) ([name])   Chronic respiratory failure with hypoxia ([name])   ESRD (end stage renal disease) on dialysis ([name])   Hypotension   Neuropathy   Patient Summary:  Atrial fibrillation with RVR Cardiology consulted, appreciate their help. Felt that RVR is 2-2 atrial stretch in setting of volume overload. Also recommended possible discussion with EP regarding ablation. From my understanding, the risk of ablation likely outweigh benefits at this time.  -Cardiology following, appreciate their input -Resume digoxin 0.125 mg 3 times weekly, Monday, Wednesday, Fridays -Resume Eliquis 5 mg twice daily -Telemetry - Consider EP consult if rates continue to be increased.   ESRD on HD MWF Chronic hypotension ESRD anemia On MWF schedule. Missed dialysis yesterday. Seen by nephrology and appreciate their help. He will go to dialysis today and likely again tomorrow. Estimate 4+ liters UF today. Encouraged patient to stay for entire session to help with symptoms of uremia and  volume. Encourage diet changes / fluid restriction.  - Appreciate nephrology's management - Continue midodrine 30 mg TID - Patient refused prior to session today.  - Vit D supplementation - Continue Cinacalcet 30 mg every Tuesday, Thursday, Saturday - Continue Renvela 1600 mg 3 times daily with meals  Disposition Planning Functional deconditioning BMI 47 Per patient - was unable to receive proper care at [facility] and returned after missed medications / dialysis. He has some increased SOB, and other symptoms from missing dialysis but is otherwise well. He is amenable to trying another rehab facility. - Appreciate SW continued efforts; will begin looking for SNF placement.  - Continue to engage with PT   Chronic hypoxic respiratory failure Suspected secondary to amiodarone toxicity Home supplemental O2, 2-4L. No crackles on exam, but dose endorse orthopnea, dyspnea. I imagine some of this is 2-2 volume.  - Wean with goal O2 sat >90%   AoCHRF HFrEF [EF 30-35%]  Last EF 30-35% in October 2024 with moderately decreased LV function and global hypokinsas. GDMT limited due to hypotension. No crackles, endorses orthopnea - Continue with HD - Continue with Digoxin - Cardiology consulted in ED d/t a-fib w/ RVR. Will obtain new echo   Chronic somatic and neuropathic pain Chronic opiate therapy - Scheduled Tylenol 100 mg QID  - Venlafaxine 37.5mg  daily  - Lyrica 50mg  daily  - Oxycodone 10mg  q6hrs PRN    Sacral decubitus ulcer, stage IV - Foam dressing needs to be in place over sacral area.  - RD consult pending.    Hidradenitis Suppurativa  No active treatment and continues to endorse pain, which is exacerbated by limited mobility in bed - Tylenol 1000 mg q6hrs PRN  - Triamcinolone ointment over left lateral thigh  - Cover with foam dressing.    Hypothyroidism Repeat TSH today slightly increased. Prior TSH and FT4 within range. May reflect euthyroid sick. Repeat outpatient. - Continue  Levothyroxine 25 mcg daily  R great toe wound, c/f ingrown toenail Noted at discharge 09/06/23. Today, some worsening of local swelling and purulence. May be 2-2 ingrown toenail. Xray normal.  - Warm soaks 10-15 minutes tid followed by mupirocin ointment for 7 days. - Discussed with podiatry, may need procedure to correct ingrown nail in future.   Diet: Renal VTE: Eliquis Code: Full PT/OT recs: home; will need assistance to be wheeled to and from the house to the car that takes him to HD TOC recs: Ongoing  Dispo: Anticipated discharge to SNF pending TOC needs / SNF placement and improvement of volume status.   Lovie Macadamia MD Internal Medicine Resident PGY-1 Pager: [phone] Please contact the on call pager after 5 pm and on weekends at [phone].

## 2023-09-08 NOTE — Progress Notes (Signed)
  Echocardiogram 2D Echocardiogram has been performed.  Ocie Doyne RDCS 09/08/2023, 8:21 AM

## 2023-09-08 NOTE — Progress Notes (Signed)
Heart Failure Navigator Progress Note  Assessed for Heart & Vascular TOC clinic readiness.  Patient does not meet criteria due to Advanced Heart Failure team patient of Dr. Shirlee Latch..   Navigator will sign off at this time.    Rhae Hammock, BSN, Scientist, clinical (histocompatibility and immunogenetics) Only

## 2023-09-08 NOTE — ED Notes (Addendum)
Foam dressing placed to sacrum and left thigh per order. Gauze dressing applied to right great toe per order.

## 2023-09-08 NOTE — Progress Notes (Signed)
Pt receives out-pt HD at [facility] on MWF. Will assist as needed.   Olivia Canter Renal Navigator [phone]

## 2023-09-08 NOTE — BH Specialist Note (Signed)
This Licensed Clinical Engineer, building services (LCSW-A) and Visual merchandiser [facility]) conducted a telehealth telephone call using HIPAA-compliant guidelines. The [facility] verified the patient's date of birth and name before proceeding with the conversation.  The [facility] contacted the patient's mother, who is listed as the guardian, in response to an inbasket message indicating that the patient requested the [facility] to reach out to their mom. At the time of this interaction, the patient was located in the Emergency Department (ED).  During the conversation, the patient's mother informed the [facility] that [facility], affiliated with [facility], is ready to place the patient. However, they require a referral to be entered into their system from the ED specifically for rehabilitation services.  Following this discussion, the [facility] promptly relayed this information to the [facility]) staff and providers via a secure messaging system. Additionally, the [facility] updated A. Dorris Carnes, the [facility]) Child psychotherapist currently assigned to the patient's case, to ensure all relevant parties are informed of the current situation and necessary actions.  No additional follow up is requested.   Christen Butter, MSW, LCSW-A She/Her Behavioral Health Clinician [facility] Direct Dial:[phone]  Fax [phone] Main Office Phone: [phone] [address] [place], [place] [address] Website: [facility] Internal Medicine [facility]  [place], [place]  [place]

## 2023-09-08 NOTE — Progress Notes (Signed)
Received patient in bed.Awake,alert and oriented x 4.Consent verified.  Access used : Right HD catheter that worked well.Dressing changed done today.  Duration of treatment : 4 hours.  UF goal : 3.2 out of 5 liters prescribed. See comment.  Tolerated treatment: No  Hemo comment:      Due to his refusal to take his blood support medicine,his SBP  were dropping,Third hour into his treatment ,he has had a bad cramping,he quit his treatment thus 3.2 Liters of net UF from him.He signed AMA/early termination of treatment.  Hand off to the patient's nurse:Admitted to his room with stable medical condition via transporter.

## 2023-09-09 ENCOUNTER — Other Ambulatory Visit: Payer: Self-pay

## 2023-09-09 DIAGNOSIS — I953 Hypotension of hemodialysis: Secondary | ICD-10-CM | POA: Diagnosis not present

## 2023-09-09 DIAGNOSIS — N186 End stage renal disease: Secondary | ICD-10-CM | POA: Diagnosis not present

## 2023-09-09 DIAGNOSIS — L600 Ingrowing nail: Secondary | ICD-10-CM

## 2023-09-09 DIAGNOSIS — I4891 Unspecified atrial fibrillation: Secondary | ICD-10-CM | POA: Diagnosis not present

## 2023-09-09 DIAGNOSIS — I5022 Chronic systolic (congestive) heart failure: Secondary | ICD-10-CM

## 2023-09-09 DIAGNOSIS — L03031 Cellulitis of right toe: Secondary | ICD-10-CM

## 2023-09-09 DIAGNOSIS — Z992 Dependence on renal dialysis: Secondary | ICD-10-CM | POA: Diagnosis not present

## 2023-09-09 LAB — CBC
HCT: 29.6 % — ABNORMAL LOW (ref 39.0–52.0)
Hemoglobin: 9.6 g/dL — ABNORMAL LOW (ref 13.0–17.0)
MCH: 33 pg (ref 26.0–34.0)
MCHC: 32.4 g/dL (ref 30.0–36.0)
MCV: 101.7 fL — ABNORMAL HIGH (ref 80.0–100.0)
Platelets: 182 10*3/uL (ref 150–400)
RBC: 2.91 MIL/uL — ABNORMAL LOW (ref 4.22–5.81)
RDW: 16 % — ABNORMAL HIGH (ref 11.5–15.5)
WBC: 7.7 10*3/uL (ref 4.0–10.5)
nRBC: 0 % (ref 0.0–0.2)

## 2023-09-09 LAB — RENAL FUNCTION PANEL
Albumin: 3.2 g/dL — ABNORMAL LOW (ref 3.5–5.0)
Anion gap: 18 — ABNORMAL HIGH (ref 5–15)
BUN: 69 mg/dL — ABNORMAL HIGH (ref 6–20)
CO2: 20 mmol/L — ABNORMAL LOW (ref 22–32)
Calcium: 9.7 mg/dL (ref 8.9–10.3)
Chloride: 95 mmol/L — ABNORMAL LOW (ref 98–111)
Creatinine, Ser: 8.34 mg/dL — ABNORMAL HIGH (ref 0.61–1.24)
GFR, Estimated: 8 mL/min — ABNORMAL LOW (ref >60)
Glucose, Bld: 110 mg/dL — ABNORMAL HIGH (ref 70–99)
Phosphorus: 6.3 mg/dL — ABNORMAL HIGH (ref 2.5–4.6)
Potassium: 4.6 mmol/L (ref 3.5–5.1)
Sodium: 133 mmol/L — ABNORMAL LOW (ref 135–145)

## 2023-09-09 LAB — MAGNESIUM: Magnesium: 1.9 mg/dL (ref 1.7–2.4)

## 2023-09-09 MED ORDER — DARBEPOETIN ALFA 150 MCG/0.3ML IJ SOSY
150.0000 ug | PREFILLED_SYRINGE | INTRAMUSCULAR | Status: DC
  Filled 2023-09-09: qty 0.3

## 2023-09-09 MED ORDER — MUPIROCIN 2 % EX OINT
TOPICAL_OINTMENT | Freq: Three times a day (TID) | CUTANEOUS | Status: DC
  Administered 2023-09-11: 1 via TOPICAL
  Filled 2023-09-09 (×5): qty 22

## 2023-09-09 MED ORDER — OXYCODONE HCL 5 MG PO TABS
5.0000 mg | ORAL_TABLET | Freq: Once | ORAL | Status: AC
  Administered 2023-09-09: 5 mg via ORAL
  Filled 2023-09-09: qty 1

## 2023-09-09 NOTE — Progress Notes (Signed)
Case discussed with team/RN CM. Pt has been accepted at [facility]. Navigator spoke to [place] with [facility] who confirms that as of recently, [facility] now agreeable to transport pts to their normal HD clinic on normal schedule to avoid referral for new clinic placement while pt at rehab. [name] out-pt HD clinic info ([facility] [address] on MWF 11:55 am chair time) provided to [place]. Also contacted [facility] to advise staff of the above plan. Renal NP made aware of this info as well. Will assist as needed.   Olivia Canter Renal Navigator [phone]

## 2023-09-09 NOTE — Patient Outreach (Signed)
Patient current inpatient.Phillip Young, MHA [facility]  Managed [facility] Social Worker [phone]

## 2023-09-09 NOTE — Progress Notes (Signed)
[facility] Progress Note   Subjective:    Seen and examined patient at bedside. He's sitting up in the recliner about to work with PT. Noted only net UF 3.2L because he was having some cramping causing him to sign off early. Plan for HD again today to get him back on schedule. He agrees to take his Midodrine and wants to try pushing his UF. UFG set 4-5L.   Objective Vitals:   09/08/23 1955 09/09/23 0017 09/09/23 0439 09/09/23 0903  BP: (!) 143/92 107/78 112/80 114/89  Pulse:   (!) 105 64  Resp: 16 20 20 18   Temp: 98.2 F (36.8 C) 97.6 F (36.4 C) 97.9 F (36.6 C)   TempSrc: Oral Oral Oral   SpO2: 96% 97% 98% 96%  Weight:      Height:       Physical Exam  General: Awake, alert, obese, on O2, NAD Lungs: Clear throughout. No wheeze, rales or rhonchi. Breathing is unlabored. Heart: RRR. No murmur, rubs or gallops.  Abdomen: Large, soft, non-tender  Lower extremities: Diffuse LE edema Neuro: AAOx3. Dialysis Access: R internal jugular [facility]  Filed Weights   09/07/23 1439  Weight: (!) 164.7 kg    Intake/Output Summary (Last 24 hours) at 09/09/2023 1116 Last data filed at 09/09/2023 0330 Gross per 24 hour  Intake 900 ml  Output 3200 ml  Net -2300 ml    Additional Objective Labs: Basic Metabolic Panel: Recent Labs  Lab 09/06/23 0335 09/07/23 1452 09/08/23 0339 09/09/23 0448  NA 137 134* 134* 133*  K 4.6 4.9 4.7 4.6  CL 95* 98 98 95*  CO2 24 18* 20* 20*  GLUCOSE 87 112* 132* 110*  BUN 51* 81* 86* 69*  CREATININE 7.36* 9.84* 10.33* 8.34*  CALCIUM 10.3 9.6 9.7 9.7  PHOS 5.2*  --  6.3* 6.3*   Liver Function Tests: Recent Labs  Lab 09/07/23 1452 09/08/23 0339 09/09/23 0448  AST 26  --   --   ALT 18  --   --   ALKPHOS 125  --   --   BILITOT 1.1  --   --   PROT 6.8  --   --   ALBUMIN 3.3* 3.3* 3.2*   No results for input(s): "LIPASE", "AMYLASE" in the last 168 hours. CBC: Recent Labs  Lab 09/03/23 0457 09/05/23 0343 09/07/23 1452  09/08/23 0339 09/09/23 0448  WBC 7.9 7.7 9.2 7.8 7.7  NEUTROABS 5.7  --  6.4  --   --   HGB 9.8* 10.1* 9.6* 10.0* 9.6*  HCT 30.2* 30.8* 31.8* 30.8* 29.6*  MCV 103.1* 103.7* 110.8* 104.1* 101.7*  PLT 225 203 182 176 182   Blood Culture    Component Value Date/Time   SDES BLOOD BLOOD RIGHT HAND 05/27/2023 0959   SPECREQUEST  05/27/2023 0959    BOTTLES DRAWN AEROBIC AND ANAEROBIC Blood Culture adequate volume   CULT  05/27/2023 0959    NO GROWTH 5 DAYS Performed at [facility], [address], [place], [place] [address]    REPTSTATUS 06/01/2023 FINAL 05/27/2023 6045    Cardiac Enzymes: No results for input(s): "CKTOTAL", "CKMB", "CKMBINDEX", "TROPONINI" in the last 168 hours. CBG: Recent Labs  Lab 09/05/23 2009 09/06/23 0723 09/06/23 1131 09/06/23 1708 09/08/23 0751  GLUCAP 104* 107* 104* 135* 121*   Iron Studies: No results for input(s): "IRON", "TIBC", "TRANSFERRIN", "FERRITIN" in the last 72 hours. Lab Results  Component Value Date   INR 2.0 (H) 06/10/2022  INR 2.8 (H) 06/09/2022   INR 2.9 (H) 06/07/2022   Studies/Results: ECHOCARDIOGRAM COMPLETE Result Date: 09/08/2023    ECHOCARDIOGRAM REPORT   Patient Name:   Phillip Young Date of Exam: 09/08/2023 Medical Rec #:  [id number]      Height:       72.0 in Accession #:    [id number]     Weight:       363.0 lb Date of Birth:  Mar 19, 1986       BSA:          2.745 m Patient Age:    37 years       BP:           111/84 mmHg Patient Gender: M              HR:           98 bpm. Exam Location:  Inpatient Procedure: 2D Echo, Cardiac Doppler and Color Doppler (Both Spectral and Color            Flow Doppler were utilized during procedure). Indications:     Atrial fibrillation  History:         Patient has prior history of Echocardiogram examinations, most                  recent 05/26/2023. CHF, COPD, Arrythmias:Atrial Flutter and                  Atrial Fibrillation, Signs/Symptoms:Shortness of Breath and                   Hypotension; Risk Factors:Diabetes and Sleep Apnea. ESRD.  Sonographer:     [name] Referring Phys:  [id number] Karl Ito Diagnosing Phys: Clearnce Hasten  Sonographer Comments: Image acquisition challenging due to patient body habitus, Image acquisition challenging due to uncooperative patient and patient persistently moving. IMPRESSIONS  1. Based on VTI and aortic valve excursion cardiac output is severely reduced.. Left ventricular ejection fraction, by estimation, is <20%. The left ventricle has severely decreased function. The left ventricle has global hypokinesis. The left ventricular internal cavity size was moderately dilated. Left ventricular diastolic function could not be evaluated due to atrial fibrillation but is likely abnormal.  2. Right ventricular systolic function is moderately reduced. The right ventricular size is mildly enlarged. There is mildly elevated pulmonary artery systolic pressure.  3. Left atrial size was moderately dilated.  4. The mitral valve is normal in structure. Mild mitral valve regurgitation. No evidence of mitral stenosis.  5. Tricuspid valve regurgitation is moderate.  6. The aortic valve is normal in structure. Aortic valve regurgitation is not visualized. No aortic stenosis is present.  7. Severely dilated pulmonary artery. FINDINGS  Left Ventricle: Based on VTI and aortic valve excursion cardiac output is severely reduced. Left ventricular ejection fraction, by estimation, is <20%. The left ventricle has severely decreased function. The left ventricle demonstrates global hypokinesis. The left ventricular internal cavity size was moderately dilated. There is no left ventricular hypertrophy. Left ventricular diastolic function could not be evaluated due to atrial fibrillation. Left ventricular diastolic function could not be evaluated. Right Ventricle: The right ventricular size is mildly enlarged. No increase in right ventricular wall thickness. Right ventricular  systolic function is moderately reduced. There is mildly elevated pulmonary artery systolic pressure. The tricuspid regurgitant velocity is 2.92 m/s, and with an assumed right atrial pressure of 8 mmHg, the estimated right ventricular systolic pressure is 42.1 mmHg. Left Atrium: Left atrial size  was moderately dilated. Right Atrium: Right atrial size was normal in size. Pericardium: There is no evidence of pericardial effusion. Mitral Valve: The mitral valve is normal in structure. Severely decreased mobility of the mitral valve leaflets. Mild mitral valve regurgitation. No evidence of mitral valve stenosis. MV peak gradient, 4.3 mmHg. The mean mitral valve gradient is 2.0 mmHg. Tricuspid Valve: The tricuspid valve is normal in structure. Tricuspid valve regurgitation is moderate . No evidence of tricuspid stenosis. Aortic Valve: The aortic valve is normal in structure. Aortic valve regurgitation is not visualized. No aortic stenosis is present. Aortic valve mean gradient measures 1.5 mmHg. Aortic valve peak gradient measures 1.8 mmHg. Aortic valve area, by VTI measures 1.36 cm. Pulmonic Valve: The pulmonic valve was normal in structure. Pulmonic valve regurgitation is trivial. No evidence of pulmonic stenosis. Aorta: The aortic root and ascending aorta are structurally normal, with no evidence of dilitation. Pulmonary Artery: The pulmonary artery is severely dilated. Venous: The inferior vena cava was not well visualized. IAS/Shunts: No atrial level shunt detected by color flow Doppler. Additional Comments: 3D imaging was not performed.  LEFT VENTRICLE PLAX 2D LVIDd:         6.40 cm LVIDs:         5.70 cm LV PW:         1.20 cm LV IVS:        0.80 cm LVOT diam:     1.80 cm LV SV:         15 LV SV Index:   5 LVOT Area:     2.54 cm  LV Volumes (MOD) LV vol d, MOD A2C: 259.0 ml LV vol d, MOD A4C: 125.0 ml LV vol s, MOD A2C: 179.0 ml LV vol s, MOD A4C: 87.1 ml LV SV MOD A2C:     80.0 ml LV SV MOD A4C:     125.0 ml LV  SV MOD BP:      71.3 ml RIGHT VENTRICLE            IVC RV Basal diam:  4.00 cm    IVC diam: 1.70 cm RV Mid diam:    3.20 cm RV S prime:     8.49 cm/s TAPSE (M-mode): 1.9 cm LEFT ATRIUM            Index        RIGHT ATRIUM           Index LA diam:      5.10 cm  1.86 cm/m   RA Area:     17.80 cm LA Vol (A2C): 78.4 ml  28.56 ml/m  RA Volume:   43.40 ml  15.81 ml/m LA Vol (A4C): 120.0 ml 43.72 ml/m  AORTIC VALVE                    PULMONIC VALVE AV Area (Vmax):    1.92 cm     PV Vmax:       0.70 m/s AV Area (Vmean):   1.45 cm     PV Peak grad:  2.0 mmHg AV Area (VTI):     1.36 cm AV Vmax:           67.35 cm/s AV Vmean:          51.550 cm/s AV VTI:            0.109 m AV Peak Grad:      1.8 mmHg AV Mean Grad:      1.5 mmHg  LVOT Vmax:         50.70 cm/s LVOT Vmean:        29.300 cm/s LVOT VTI:          0.058 m LVOT/AV VTI ratio: 0.53  AORTA Ao Root diam: 3.00 cm Ao Asc diam:  2.80 cm MITRAL VALVE               TRICUSPID VALVE MV Area (PHT): 7.44 cm    TR Peak grad:   34.1 mmHg MV Area VTI:   1.44 cm    TR Vmax:        292.00 cm/s MV Peak grad:  4.3 mmHg MV Mean grad:  2.0 mmHg    SHUNTS MV Vmax:       1.04 m/s    Systemic VTI:  0.06 m MV Vmean:      65.4 cm/s   Systemic Diam: 1.80 cm MV Decel Time: 102 msec MR Peak grad: 68.9 mmHg MR Mean grad: 46.0 mmHg MR Vmax:      415.00 cm/s MR Vmean:     319.0 cm/s MV E velocity: 57.90 cm/s Clearnce Hasten Electronically signed by Clearnce Hasten Signature Date/Time: 09/08/2023/8:30:13 AM    Final (Updated)    DG Toe Great Right Result Date: 09/08/2023 CLINICAL DATA:  Paronychia of great toe EXAM: RIGHT GREAT TOE COMPARISON:  None Available. FINDINGS: There is no evidence of fracture or dislocation. There is no evidence of arthropathy or other focal bone abnormality. Soft tissues are unremarkable. IMPRESSION: Negative. Electronically Signed   By: Charlett Nose M.D.   On: 09/08/2023 00:20   DG Chest Port 1 View Result Date: 09/07/2023 CLINICAL DATA:  Shortness of breath.  EXAM: PORTABLE CHEST 1 VIEW COMPARISON:  08/30/2023, additional priors reviewed FINDINGS: Patient had difficulty with the exam, both costophrenic angles are not included in the field of view. Right-sided dialysis catheter in place. Chronic cardiomegaly. Unchanged mediastinal contours, globular appearance of the heart is a chronic finding. Mild vascular congestion. No confluent consolidation. Trace fluid in the fissures without significant sub pulmonic effusion. No pneumothorax. IMPRESSION: Chronic cardiomegaly. Mild vascular congestion. Trace fluid in the fissures without significant sub pulmonic effusion. Electronically Signed   By: Narda Rutherford M.D.   On: 09/07/2023 17:08    Medications:   acetaminophen  1,000 mg Oral Q6H   apixaban  5 mg Oral BID   Chlorhexidine Gluconate Cloth  6 each Topical Q0600   cholecalciferol  400 Units Oral Daily   cinacalcet  30 mg Oral Q T,Th,Sa-HD   digoxin  0.125 mg Oral Once per day on Monday Wednesday Friday   levothyroxine  25 mcg Oral Q0600   midodrine  30 mg Oral TID WC   mupirocin ointment   Topical TID   pregabalin  50 mg Oral Daily   sevelamer carbonate  1,600 mg Oral TID WC   triamcinolone ointment   Topical BID   venlafaxine XR  37.5 mg Oral Daily    Dialysis Orders:  [facility] MWF From dec 2024 -->  4.5h   2/2 bath  155.5kg  RIJ TDC  Heparin none Micera every 2 weeks - last dose 08/24/23 Venofer 100mg  load - last dose 08/24/23. Last dose scheduled for 09/02/23   Renal-related home meds: - sensipar 30 every day - fosrenol 1 gm ac tid - midodrine 30 tid - others: effexor xr, lyrica, T4, digoxin, eliquis  Assessment/Plan: Afib with RVR - On digoxin and Eliquis. Not a candidate for amiodarone or AV  nodal ablation with PPM. Had multiple failed attempts at [facility]. Cardiology following. Chronic HFrEF - EF 30-35%. Manage fluid with HD Chronic hypoxic respiratory failure - on home O2. Will continue this here.  Chronic pain - On Tylenol, Lyrica,  Dilaudid PRN, and Venlafaxine. Managed by primary ESRD -  on HD MWF. Received HD 2/13 and 3.2L was removed. Plan for treatment again today to get him back on schedule. Chronic hypotension/volume  - On Midodrine 30mg  TID but he refused his dose this AM. Continue to encourage regimen. Push UF as tolerated. Anemia of CKD - Hgb 10. Last dose given on 1/29. Will start ESA here Secondary Hyperparathyroidism -  Continue Sensipar and Renvela. Nutrition - Renal diet with fluid restriction. Advised to limit outside foods Dispo - Likely will need to find another SNF for him. Reached out to our Renal Navigator to determine this.  Salome Holmes, NP [facility] 09/09/2023,11:16 AM  LOS: 1 day

## 2023-09-09 NOTE — Progress Notes (Addendum)
HD#1 Subjective:   Summary: Phillip Young is a 38 y.o. malewith multiple significant chronic conditions including ESRD with HD, HFrEF, Atrial fibrillation on Eliquis and chronic digoxin, Chronic respiratory failure 2/2 amiodarone toxicity, BMI 47, T2DM, and polyneuropathy returning from SNF d/t SOB after they were ostensibly unable to provide patient with medications / dialysis.  Overnight Events: None  Feeling better today. Sitting in bed and eating when seen. Very appreciative of his care here. Dialysis cut short yesterday due to cramping and low SBP's. Discussed taking his midodrine prior to dialysis - patient stated he would try this today but cites prior dizziness and side effects from this medication.    Objective:  Vital signs in last 24 hours: Vitals:   09/08/23 1955 09/09/23 0017 09/09/23 0439 09/09/23 0903  BP: (!) 143/92 107/78 112/80 114/89  Pulse:   (!) 105 64  Resp: 16 20 20 18   Temp: 98.2 F (36.8 C) 97.6 F (36.4 C) 97.9 F (36.6 C)   TempSrc: Oral Oral Oral   SpO2: 96% 97% 98% 96%  Weight:      Height:       Supplemental O2: Nasal Cannula SpO2: 96 % O2 Flow Rate (L/min): 6 L/min   Physical Exam:  Constitutional: In no acute distress Cardiovascular:irregular rate and rhythm Pulmonary/Chest: Clear to auscultation bilaterally. No appreciable crackles.  Abdominal: soft, non-tender MSK: Anterior R chest with HD cath in place without surrounding erythema Neurological: alert & oriented  Skin: Right toe great toe bandaged today.  Psych: Pleasant mood and affect  Filed Weights   09/07/23 1439  Weight: (!) 164.7 kg      Intake/Output Summary (Last 24 hours) at 09/09/2023 0932 Last data filed at 09/09/2023 0330 Gross per 24 hour  Intake 900 ml  Output 3200 ml  Net -2300 ml   Net IO Since Admission: -2,500 mL [09/09/23 0932]  Pertinent Labs:    Latest Ref Rng & Units 09/09/2023    4:48 AM 09/08/2023    3:39 AM 09/07/2023    2:52 PM  CBC  WBC 4.0 -  10.5 K/uL 7.7  7.8  9.2   Hemoglobin 13.0 - 17.0 g/dL 9.6  16.1  9.6   Hematocrit 39.0 - 52.0 % 29.6  30.8  31.8   Platelets 150 - 400 K/uL 182  176  182        Latest Ref Rng & Units 09/09/2023    4:48 AM 09/08/2023    3:39 AM 09/07/2023    2:52 PM  CMP  Glucose 70 - 99 mg/dL 096  045  409   BUN 6 - 20 mg/dL 69  86  81   Creatinine 0.61 - 1.24 mg/dL 8.11  91.47  8.29   Sodium 135 - 145 mmol/L 133  134  134   Potassium 3.5 - 5.1 mmol/L 4.6  4.7  4.9   Chloride 98 - 111 mmol/L 95  98  98   CO2 22 - 32 mmol/L 20  20  18    Calcium 8.9 - 10.3 mg/dL 9.7  9.7  9.6   Total Protein 6.5 - 8.1 g/dL   6.8   Total Bilirubin 0.0 - 1.2 mg/dL   1.1   Alkaline Phos 38 - 126 U/L   125   AST 15 - 41 U/L   26   ALT 0 - 44 U/L   18     Assessment/Plan:   Principal Problem:   Atrial fibrillation with RVR ([name]) Active Problems:  Morbid obesity ([name])   OSA (obstructive sleep apnea)   Hidradenitis suppurativa   Chronic systolic CHF (congestive heart failure) ([name])   Chronic respiratory failure with hypoxia ([name])   ESRD (end stage renal disease) on dialysis ([name])   Hypotension   Neuropathy   Ingrown toenail of right foot   Patient Summary:  Atrial fibrillation with RVR Cardiology consulted, appreciate their help. Felt that RVR is 2-2 atrial stretch in setting of volume overload. Also recommended possible discussion with EP regarding ablation. Per cards - not a candidate for ablation currently. Tachy in the 100's this AM and overnight.  -Cardiology following, appreciate their input -Digoxin 0.125 mg 3 times weekly, Monday, Wednesday, Fridays -Eliquis 5 mg twice daily -Telemetry - Continue HD w/ high UF goals to optimize volume.  ESRD on HD MWF Chronic hypotension ESRD anemia On MWF schedule. Seen by nephrology and appreciate their help. He will go to dialysis again today. Encouraged patient to stay for entire session to help with symptoms of uremia and volume. Patient will try midodrine  today - appreciate this. Encourage diet changes / fluid restriction.  - Appreciate nephrology's management - Continue midodrine 30 - Patient refused prior to session yesterday - Vit D supplementation - Continue Cinacalcet 30 mg every Tuesday, Thursday, Saturday - Continue Renvela 1600 mg 3 times daily with meals  Disposition Planning Functional deconditioning BMI 47 Per patient - was unable to receive proper care at [facility] and returned after missed medications / dialysis. He has some increased SOB, and other symptoms from missing dialysis but is otherwise well. He is amenable to trying another rehab facility. - Appreciate SW continued efforts; will begin looking for SNF placement.  - Continue to engage with PT   Chronic hypoxic respiratory failure Suspected secondary to amiodarone toxicity Home supplemental O2, 2-4L. No crackles on exam, but dose endorse orthopnea, dyspnea. I imagine some of this is 2-2 volume. O2 requirement decreased this AM. - Wean with goal O2 sat >90%   AoCHRF HFrEF [EF <20%]  Last EF 30-35% in October 2024 with moderately decreased LV function and global hypokinesis. Echo this admission with challenging views, however EF estimated at <20% with global hypokinesis. GDMT limited due to hypotension. No crackles, endorses orthopnea - Continue with HD - Continue with Digoxin - Cardiology consulted in ED d/t a-fib w/ RVR.   Chronic somatic and neuropathic pain Chronic opiate therapy - Scheduled Tylenol 100 mg QID  - Venlafaxine 37.5mg  daily  - Lyrica 50mg  daily  - Oxycodone 10mg  q6hrs PRN   - Some increased pain in legs and back today, will give Oxycodone 5mg  PO additional dose one time today, but otherwise should be on home regimen.    Sacral decubitus ulcer, stage IV - Foam dressing needs to be in place over sacral area.  - RD consult pending.    Hidradenitis Suppurativa  No active treatment and continues to endorse pain, which is exacerbated by limited mobility  in bed - Tylenol 1000 mg q6hrs PRN  - Triamcinolone ointment over left lateral thigh  - Cover with foam dressing.    Hypothyroidism Repeat TSH today slightly increased. Prior TSH and FT4 within range. May reflect euthyroid sick. Repeat outpatient. - Continue Levothyroxine 25 mcg daily  R great toe wound, c/f ingrown toenail Noted at discharge 09/06/23. Today, some worsening of local swelling and purulence. May be 2-2 ingrown toenail. Xray normal.  - Warm soaks 10-15 minutes tid followed by mupirocin ointment for 7 days. - Discussed with podiatry,  may need procedure to correct ingrown nail in future.   - Podiatry to see today - appreciate their help greatly.    Diet: Renal VTE: Eliquis Code: Full PT/OT recs: home; will need assistance to be wheeled to and from the house to the car that takes him to HD TOC recs: Ongoing   Dispo: Anticipated discharge to SNF pending TOC needs / SNF placement and improvement of volume status.   Lovie Macadamia MD Internal Medicine Resident PGY-1 Pager: [phone] Please contact the on call pager after 5 pm and on weekends at [phone].

## 2023-09-09 NOTE — TOC Progression Note (Addendum)
Transition of Care [facility]) - Progression Note    Patient Details  Name: Phillip Young MRN: [id number] Date of Birth: 04/22/1986  Transition of Care [facility]) CM/SW Contact  Lockie Pares, RN Phone Number: 09/09/2023, 2:39 PM  Clinical Narrative:    Spoke with social work Revonda Standard who spoke to the patients mother at length. The patients mother has spoken to [facility], [facility], and the MD has also spoken to [facility]. Feliberto Harts, liaison at [facility]. She states that the MD there has accepted the patient.Placed in HUB and therapy notes sent. They may not have a bed until Monday. Team made aware. Victorino Dike will call back should a bed open up prior to then.  1445 Victorino Dike called back to state it would be Monday for sure, however wanted to know if he could go to OP dialysis then transfer from there to [facility]. Have inquired with Renal CSW. 1455 Renal navigator states the patient will likely go after dialysis here. Asked if [facility] had set up for OP clinic. They responded that they would transport to his normal clinic at his normal times ( M-W_F) for dialysis.  Expected Discharge Plan: IP Rehab Facility    Expected Discharge Plan and Services        IP rehab ( encompass [facility])                                       Social Determinants of Health (SDOH) Interventions SDOH Screenings   Food Insecurity: No Food Insecurity (09/08/2023)  Housing: Low Risk  (09/08/2023)  Transportation Needs: Unmet Transportation Needs (09/08/2023)  Utilities: Not At Risk (09/08/2023)  Alcohol Screen: Low Risk  (09/21/2022)  Depression (PHQ2-9): High Risk (06/21/2023)  Financial Resource Strain: Low Risk  (03/24/2023)   Received from [facility]  Physical Activity: Inactive (09/21/2022)  Social Connections: Moderately Isolated (03/24/2023)   Received from [facility]  Stress: No Stress Concern Present (04/21/2023)   Received from [facility]  Tobacco Use: Medium Risk (09/07/2023)     Readmission Risk Interventions    07/05/2023    4:25 PM 05/24/2023    4:43 PM 03/23/2023    3:48 PM  Readmission Risk Prevention Plan  Transportation Screening Complete Complete Complete  PCP or Specialist Appt within 5-7 Days  Complete   Home Care Screening  Complete   Medication Review (RN CM)  Referral to Pharmacy   HRI or Home Care Consult Complete    Social Work Consult for Recovery Care Planning/Counseling Complete    Palliative Care Screening Not Applicable    Medication Review [employer]) Complete  Complete  PCP or Specialist appointment within 3-5 days of discharge   --  HRI or Home Care Consult   Complete  SW Recovery Care/Counseling Consult   Complete  Palliative Care Screening   Not Applicable  Skilled Nursing Facility   Not Applicable

## 2023-09-09 NOTE — Progress Notes (Signed)
   09/08/23 2230  BiPAP/CPAP/SIPAP  $ Non-Invasive Home Ventilator  Initial  BiPAP/CPAP/SIPAP Pt Type Adult  BiPAP/CPAP/SIPAP  (home unit)  Auto Titrate No   Home unit

## 2023-09-09 NOTE — Evaluation (Signed)
Physical Therapy Evaluation Patient Details Name: Phillip Young MRN: [id number] DOB: 1985-12-04 Today's Date: 09/09/2023  History of Present Illness  Patient is 38 y.o. male returning to ED from SNF on 09/07/23 for SOB, chest pounding, and non-compliance with medicine. Pt with recent admission from 08/30/23-09/06/23 after missing dialysis visit. Additionally, pt had the toenail of the R great toe removed 09/09/23. PMH: morbid obesity, HTN, prediabetes, OSA on home BiPAP, chronic respirator failure on 2L home oxygen, chronic systolic heart failure due to NICM, ESRD, and PAF.  Clinical Impression  Pt is a 38 y.o. male presenting on 09/07/23 from a SNF with above conditions and deficits below, see PT Problem List. Prior to SNF admission, pt lived in an apartment with his mother. He used a manual w/c for community and the majority of home navigation and his mom provided assistance for ADLs and mobility. Currently the pt is supervision for bed mobility with HOB elevated and min A for STS and step pivot transfers. During pre gait activities pt reported dizziness and lightheadedness. Pt stopped marching, became tachycardiac, ranging from the low 110s to 152, and had increased anterior sway that required min A to maintain balance. Due to elevated HR, limited session to step pivot transfer. Pt displays impairments in activity tolerance, gait, functional mobility, balance, and LE strength and would benefit from continued acute PT care to address these impairments. S/t pt's home situation and current deficits/conditions, PT recommends intense rehab < 3 hours. Will continue to follow acutely.      If plan is discharge home, recommend the following: A lot of help with walking and/or transfers;A little help with bathing/dressing/bathroom;Assistance with cooking/housework;Assist for transportation;Help with stairs or ramp for entrance   Can travel by private vehicle   No    Equipment Recommendations Other (comment) (pt  working on getting power w/c)  Recommendations for Other Services  OT consult    Functional Status Assessment Patient has had a recent decline in their functional status and demonstrates the ability to make significant improvements in function in a reasonable and predictable amount of time.     Precautions / Restrictions Precautions Precautions: Fall Recall of Precautions/Restrictions: Intact Precaution/Restrictions Comments: Watch BP and HR Restrictions Weight Bearing Restrictions Per Provider Order: No      Mobility  Bed Mobility Overal bed mobility: Needs Assistance Bed Mobility: Sit to Supine       Sit to supine: Supervision, HOB elevated   General bed mobility comments: Pt sits EOB with increased time, used momentum, and HOB elevated.    Transfers Overall transfer level: Needs assistance Equipment used: Rolling walker (2 wheels) Transfers: Sit to/from Stand, Bed to chair/wheelchair/BSC Sit to Stand: From elevated surface, +2 safety/equipment, Contact guard assist   Step pivot transfers: Min assist, +2 safety/equipment       General transfer comment: Min A for step pivot transfer s/t RW management, cueing for turning, balance, and hand placement when tranferring to recliner.    Ambulation/Gait             Pre-gait activities: Marches x3 bil before stopping s/t reported increased dizziness and lightheadedness General Gait Details: Not able this session  Stairs            Wheelchair Mobility     Tilt Bed    Modified Rankin (Stroke Patients Only)       Balance Overall balance assessment: Needs assistance Sitting-balance support: No upper extremity supported, Single extremity supported, Feet supported Sitting balance-Leahy Scale: Good Sitting balance - Comments:  Pt able to sit EOB without UE support or directional lean. No LOB   Standing balance support: Bilateral upper extremity supported, During functional activity, Reliant on assistive  device for balance Standing balance-Leahy Scale: Fair Standing balance comment: Pt able to reach outside BOS with greater ROM to his R than L. However, pt seemed more comfortable reaching L than R.                             Pertinent Vitals/Pain Pain Assessment Pain Assessment: Faces Faces Pain Scale: Hurts even more Pain Location: BLE pain Pain Descriptors / Indicators: Burning, Sore, Grimacing Pain Intervention(s): Monitored during session    Home Living Family/patient expects to be discharged to:: Private residence Living Arrangements: Parent Available Help at Discharge: Family;Available 24 hours/day (Lives with mom, her mobility is decreased) Type of Home: Apartment Home Access: Stairs to enter;Level entry (level entry is in the back of apartment but can't use manual w/c to get to entry s/t hill and pt's weight) Entrance Stairs-Rails: Right;Left;Can reach both Entrance Stairs-Number of Steps: 15-20 to go downstairs to his basement apartment, or can go down a hill to the back for a level entrance   Home Layout: One level Home Equipment: Wheelchair - Forensic psychologist (2 wheels);BSC/3in1;Hospital bed;Shower seat;Other (comment) (hoyer lift)      Prior Function Prior Level of Function : Needs assist             Mobility Comments: uses RW for some transfers. Uses manual w/c for home & community navigation. ADLs Comments: Unable to drive, uses w/c in community.     Extremity/Trunk Assessment   Upper Extremity Assessment Upper Extremity Assessment: Defer to OT evaluation    Lower Extremity Assessment Lower Extremity Assessment: Generalized weakness;RLE deficits/detail;LLE deficits/detail RLE Deficits / Details: decreased AROM of bil knees, ankles, hips, RLE Sensation: history of peripheral neuropathy (Painful neuropathy distal to Bil knees) LLE Deficits / Details: decreased AROM of bil knees, ankles, hips, LLE Sensation: history of peripheral neuropathy  (Painful neuropathy distal to Bil knees)    Cervical / Trunk Assessment Cervical / Trunk Assessment: Normal  Communication   Communication Communication: No apparent difficulties    Cognition Arousal: Alert Behavior During Therapy: WFL for tasks assessed/performed   PT - Cognitive impairments: No apparent impairments                         Following commands: Intact       Cueing Cueing Techniques: Verbal cues, Visual cues     General Comments General comments (skin integrity, edema, etc.): After marching in place, pt reported an onset of dizziness and lightheadedness. [name] HR during  increased to a max of 152 and ranged from the low 110s. Upon returning to sitting, pt's HR decreased to 130-110s.    Exercises     Assessment/Plan    PT Assessment Patient needs continued PT services  PT Problem List Decreased strength;Decreased range of motion;Decreased activity tolerance;Decreased balance;Decreased mobility;Decreased coordination;Decreased knowledge of use of DME;Decreased knowledge of precautions;Cardiopulmonary status limiting activity;Impaired sensation;Obesity;Pain       PT Treatment Interventions DME instruction;Gait training;Stair training;Functional mobility training;Therapeutic activities;Therapeutic exercise;Balance training;Neuromuscular re-education;Patient/family education;Wheelchair mobility training    PT Goals (Current goals can be found in the Care Plan section)  Acute Rehab PT Goals Patient Stated Goal: to reduce BLE pain, improve mobility tolerance PT Goal Formulation: With patient Time For Goal Achievement: 09/23/23 Potential to Achieve  Goals: Fair    Frequency Min 1X/week     Co-evaluation               AM-PAC PT "6 Clicks" Mobility  Outcome Measure Help needed turning from your back to your side while in a flat bed without using bedrails?: A Little Help needed moving from lying on your back to sitting on the side of a flat bed  without using bedrails?: A Little Help needed moving to and from a bed to a chair (including a wheelchair)?: A Little Help needed standing up from a chair using your arms (e.g., wheelchair or bedside chair)?: A Little Help needed to walk in hospital room?: Total Help needed climbing 3-5 steps with a railing? : Total 6 Click Score: 14    End of Session Equipment Utilized During Treatment: Oxygen;Gait belt Activity Tolerance: Patient limited by fatigue Patient left: in chair;with chair alarm set;with call bell/phone within reach Nurse Communication: Mobility status PT Visit Diagnosis: Unsteadiness on feet (R26.81);Other abnormalities of gait and mobility (R26.89);Muscle weakness (generalized) (M62.81);Difficulty in walking, not elsewhere classified (R26.2) Pain - part of body: Ankle and joints of foot;Leg (Bil distal to knees)    Time: 2952-8413 PT Time Calculation (min) (ACUTE ONLY): 41 min   Charges:   PT Evaluation $PT Eval Moderate Complexity: 1 Mod PT Treatments $Therapeutic Activity: 23-37 mins PT General Charges $$ ACUTE PT VISIT: 1 Visit         [address], SPT   [place] 09/09/2023, 11:51 AM

## 2023-09-09 NOTE — Progress Notes (Signed)
   09/09/23 2123  BiPAP/CPAP/SIPAP  BiPAP/CPAP/SIPAP Pt Type Adult  BiPAP/CPAP/SIPAP  (home unit)  Respiratory Rate 18 breaths/min  Patient Home Equipment Yes  Safety Check Completed by RT for Home Unit Yes, no issues noted   Pt placed on home CPAP unit.

## 2023-09-09 NOTE — Progress Notes (Signed)
   Patient Name: Phillip Young Date of Encounter: 09/09/2023 [facility] Cardiologist: Marca Ancona, MD   Interval Summary  .    Patient reports some improvement in breathing this morning but still feels short of breath with any amount of exertion. He feels like more fluid needs to be removed. Discussed the importance of his Midodrine to support dialysis and patient is agreeable to taking again.   Vital Signs .    Vitals:   09/08/23 1414 09/08/23 1955 09/09/23 0017 09/09/23 0439  BP: 117/84 (!) 143/92 107/78 112/80  Pulse:    (!) 105  Resp: 16 16 20 20   Temp: 98 F (36.7 C) 98.2 F (36.8 C) 97.6 F (36.4 C) 97.9 F (36.6 C)  TempSrc: Oral Oral Oral Oral  SpO2:  96% 97% 98%  Weight:      Height:        Intake/Output Summary (Last 24 hours) at 09/09/2023 0850 Last data filed at 09/09/2023 0330 Gross per 24 hour  Intake 900 ml  Output 3200 ml  Net -2300 ml      09/09/2023    5:00 AM 09/08/2023    1:00 PM 09/08/2023    9:20 AM  Last 3 Weights  Weight (lbs) -- -- --  Weight (kg) -- -- --      Telemetry/ECG    Persistent atrial fibrillation with rates 90s-120s. Average ventricular rate is approximately 100-105bpm - Personally Reviewed  Physical Exam .   GEN: No acute distress.   Neck: difficult to assess JVP due to body habitus Cardiac: irregularly irregular, faint systolic murmur Respiratory: Clear to auscultation bilaterally. GI: Soft, nontender, non-distended  MS: No pitting edema. Right great toe with dressing in place. Right foot is tender to touch.   Assessment & Plan .   Phillip Young is a 38 y.o. male with a hx of HFrEF (EF = 30-35%), PAF (on Eliquis) s/p ablation (07/01/23), DM2 c/b peripheral neuropathy, hypothyroidism, morbid obesity, OSA, chronic hypoxic respiratory failure (bl 6L), HIT, hidradenitis suppurativa, and chronic pain syndrome (on opioids) who is being seen 09/07/2023 for the evaluation of atrial fibrillation with RVR at the request of  Dr. Eloise Harman.    Atrial fibrillation/flutter with RVR Patient with acute dyspnea found to be in atrial flutter with RVR on admission. Review of notes shows that previous rate/rhythm control has been severely limited by drug intolerances as well as due to co-morbidities including ESRD. Rates still reasonably well controlled this morning, low 100s. Overall rate trend appears to be improved from admission.  Continue Mon/Weds/Fri digoxin Continue Eliquis Low BP prevents addition of beta blocker Given intolerance and ESRD, extremely limited options for rate/rhythm control. Amiodarone has previously been correlated with patient's respiratory failure 2/2 toxicity. Patient is NOT a candidate for ablation   Acute on chronic HFrEF Acute on chronic respiratory failure Patient with chronic HFrEF presented to the ED with worsening dyspnea. Missed HD on 2/12. Echo completed this admission but limited images 2/2 lack of cooperation. EF estimated <20%. Severely dilated pulmonary artery supports suspicion of hypervolemia. Patient had 3.2 L removed in HD on 2/13. Patient apparently declined his Midodrine prior to HD. GDMT very restricted by co-morbidities   Given significant co-morbidities, recommend palliative care evaluation, which patient has declined.    Per primary team: ESRD Chronic pain Hypothyroidism For questions or updates, please contact [facility] Please consult [email or web address] for contact info under        Signed, Perlie Gold, PA-C

## 2023-09-09 NOTE — NC FL2 (Signed)
[place] MEDICAID FL2 LEVEL OF CARE FORM     IDENTIFICATION  Patient Name: Phillip Young Birthdate: [birth year] Sex: male Admission Date (Current Location): 09/07/2023  [facility] and [place] Number:  Producer, television/film/video and Address:  [facility], [address], [place], [place] [address]      Provider Number: [id number]  Attending Physician Name and Address:  Dickie La, MD  Relative Name and Phone Number:       Current Level of Care: Hospital Recommended Level of Care: Skilled Nursing Facility Prior Approval Number:    Date Approved/Denied:   PASRR Number: [id number]  Discharge Plan: SNF    Current Diagnoses: Patient Active Problem List   Diagnosis Date Noted   Paronychia of great toe of right foot 09/09/2023   Ingrown nail of great toe of right foot 09/09/2023   Ingrown toenail of right foot 09/08/2023   Atrial fibrillation with RVR ([name]) 09/07/2023   Encounter for screening involving social determinants of health (SDoH) 08/30/2023   Weakness of right foot 08/25/2023   ESRD needing dialysis ([name]) 07/02/2023   Hoarseness of voice 06/21/2023   Adjustment disorder with mixed anxiety and depressed mood 06/10/2023   Metabolic encephalopathy 06/07/2023   Severe muscle deconditioning 06/03/2023   HFrEF (heart failure with reduced ejection fraction) ([name]) 06/01/2023   Neuropathy 05/28/2023   MSSA bacteremia 05/26/2023   Hyperkalemia 03/21/2023   Central line infection 03/10/2023   Pressure injury of skin 01/23/2023   Acute hypoxic respiratory failure ([name]) 01/21/2023   Atrial fibrillation ([name]) 01/21/2023   Acute on chronic hypoxic respiratory failure ([name]) 08/07/2022   Congestive heart failure ([name]) 08/05/2022   Elevated liver enzymes 06/08/2022   Multiple nodules of lung 05/31/2022   History of Gram positive sepsis    Advanced care planning/counseling discussion    Recurrent infections 09/30/2021   Reactive depression 09/02/2021    Hypervolemia 08/18/2021   Type 2 diabetes mellitus with diabetic chronic kidney disease ([name]) 06/26/2021   Medical non-compliance 04/21/2021   Hypotension 04/21/2021   ESRD (end stage renal disease) on dialysis ([name]) 04/16/2021   Paroxysmal atrial flutter ([name]) 04/12/2021   SOB (shortness of breath)    Cardiogenic shock ([name])    Chronic respiratory failure with hypoxia ([name]) 02/11/2021   Chronic systolic CHF (congestive heart failure) ([name])    Chronic cough    Morbid obesity ([name]) 02/26/2020   GERD without esophagitis 02/26/2020   OSA (obstructive sleep apnea) 02/26/2020   Hidradenitis suppurativa 02/26/2020    Orientation RESPIRATION BLADDER Height & Weight     Self, Time, Situation, Place  O2 (6 liters Nasal cannula) Continent Weight:  (Pt is refusing to stand up for weight check. Education provided for importance of getting an actual standing weight. Pt refused.) Height:  6' (182.9 cm)  BEHAVIORAL SYMPTOMS/MOOD NEUROLOGICAL BOWEL NUTRITION STATUS      Continent Diet (Please see discharge summary)  AMBULATORY STATUS COMMUNICATION OF NEEDS Skin   Extensive Assist Verbally Other (Comment) (Wound/Incision LDAs,Wound/Incision open or dehisced,other,thigh,L,post partial thickness blisters)                       Personal Care Assistance Level of Assistance  Bathing, Feeding, Dressing Bathing Assistance: Maximum assistance Feeding assistance: Independent Dressing Assistance: Maximum assistance     Functional Limitations Info  Sight, Hearing, Speech Sight Info: Adequate Hearing Info: Adequate Speech Info: Adequate    SPECIAL CARE FACTORS FREQUENCY  PT (By licensed PT), OT (By licensed OT)  PT Frequency: 5x min weekly OT Frequency: 5x min weekly            Contractures Contractures Info: Not present    Additional Factors Info  Code Status, Allergies, Psychotropic Code Status Info: FULL Allergies Info: Amiodarone,Coreg  (carvedilol),Heparin,Metoprolol,Amoxicillin,Other-Steroids Fluid seeping out of legs Psychotropic Info: pregabalin (LYRICA) capsule 50 mg daily,venlafaxine XR (EFFEXOR-XR) 24 hr capsule 37.5 mg daily         Current Medications (09/09/2023):  This is the current hospital active medication list Current Facility-Administered Medications  Medication Dose Route Frequency Provider Last Rate Last Admin   acetaminophen (TYLENOL) tablet 1,000 mg  1,000 mg Oral Q6H Juberg, Christopher, DO   1,000 mg at 09/09/23 1251   albuterol (PROVENTIL) (2.5 MG/3ML) 0.083% nebulizer solution 2.5 mg  2.5 mg Inhalation Q2H PRN Rondel Baton, MD       apixaban Everlene Balls) tablet 5 mg  5 mg Oral BID [name], DO   5 mg at 09/09/23 0801   Chlorhexidine Gluconate Cloth 2 % PADS 6 each  6 each Topical Q0600 Berenda Morale, NP   6 each at 09/09/23 0539   cholecalciferol (VITAMIN D3) 10 MCG (400 UNIT) tablet 400 Units  400 Units Oral Daily [name], DO   400 Units at 09/09/23 0802   cinacalcet (SENSIPAR) tablet 30 mg  30 mg Oral Q T,Th,Sa-HD [name], DO       Darbepoetin Alfa (ARANESP) injection 150 mcg  150 mcg Subcutaneous Q Fri-1800 Berenda Morale, NP       digoxin Margit Banda) tablet 0.125 mg  0.125 mg Oral Once per day on Monday Wednesday Friday [name], DO   0.125 mg at 09/09/23 0802   guaiFENesin (ROBITUSSIN) 100 MG/5ML liquid 5 mL  5 mL Oral Q4H PRN [name], DO       levothyroxine (SYNTHROID) tablet 25 mcg  25 mcg Oral Q0600 [name], DO   25 mcg at 09/09/23 4098   menthol-cetylpyridinium (CEPACOL) lozenge 3 mg  1 lozenge Oral PRN [name], DO       midodrine (PROAMATINE) tablet 30 mg  30 mg Oral TID WC Juberg, Christopher, DO       mupirocin ointment (BACTROBAN) 2 %   Topical TID Lovie Macadamia, MD       Oral care mouth rinse  15 mL Mouth Rinse PRN [name], DO       oxyCODONE (Oxy IR/ROXICODONE) immediate  release tablet 10 mg  10 mg Oral Q6H PRN Lovie Macadamia, MD   10 mg at 09/09/23 1255   pregabalin (LYRICA) capsule 50 mg  50 mg Oral Daily [name], DO   50 mg at 09/09/23 0802   sevelamer carbonate (RENVELA) tablet 1,600 mg  1,600 mg Oral TID WC [name], DO   1,600 mg at 09/09/23 1251   triamcinolone ointment (KENALOG) 0.1 %   Topical BID [name], DO   Given at 09/09/23 0803   venlafaxine XR (EFFEXOR-XR) 24 hr capsule 37.5 mg  37.5 mg Oral Daily [name], DO   37.5 mg at 09/09/23 0800     Discharge Medications: Please see discharge summary for a list of discharge medications.  Relevant Imaging Results:  Relevant Lab Results:   Additional Information [id number],  HD at [facility] on MWF  Delilah Shan, [employer]

## 2023-09-09 NOTE — TOC Progression Note (Signed)
Transition of Care [facility]) - Progression Note    Patient Details  Name: Phillip Young MRN: [id number] Date of Birth: 01/02/1986  [facility]) CM/SW Contact  Lockie Pares, RN Phone Number: 09/09/2023, 6:01 PM  Clinical Narrative:     Met with patient at bedisde discussed with him the acceptance at IP rehab [facility]. Patient voices understanding and thanks to be able to go there. He shared a positive experience previously  Expected Discharge Plan: IP Rehab Facility Barriers to Discharge: Continued Medical Work up  Expected Discharge Plan and Services In-house Referral: Clinical Social Work     Living arrangements for the past 2 months: Single Family Home                                       Social Determinants of Health (SDOH) Interventions SDOH Screenings   Food Insecurity: No Food Insecurity (09/08/2023)  Housing: Low Risk  (09/08/2023)  Transportation Needs: Unmet Transportation Needs (09/08/2023)  Utilities: Not At Risk (09/08/2023)  Alcohol Screen: Low Risk  (09/21/2022)  Depression (PHQ2-9): High Risk (06/21/2023)  Financial Resource Strain: Low Risk  (03/24/2023)   Received from [facility]  Physical Activity: Inactive (09/21/2022)  Social Connections: Moderately Isolated (03/24/2023)   Received from [facility]  Stress: No Stress Concern Present (04/21/2023)   Received from [facility]  Tobacco Use: Medium Risk (09/07/2023)    Readmission Risk Interventions    07/05/2023    4:25 PM 05/24/2023    4:43 PM 03/23/2023    3:48 PM  Readmission Risk Prevention Plan  Transportation Screening Complete Complete Complete  PCP or Specialist Appt within 5-7 Days  Complete   Home Care Screening  Complete   Medication Review (RN CM)  Referral to Pharmacy   HRI or Home Care Consult Complete    Social Work Consult for Recovery Care Planning/Counseling Complete    Palliative Care Screening Not Applicable    Medication Review [employer])  Complete  Complete  PCP or Specialist appointment within 3-5 days of discharge   --  HRI or Home Care Consult   Complete  SW Recovery Care/Counseling Consult   Complete  Palliative Care Screening   Not Applicable  Skilled Nursing Facility   Not Applicable

## 2023-09-09 NOTE — Discharge Instructions (Addendum)
Soak Instructions    THE DAY AFTER THE PROCEDURE  Place 1/4 cup of epsom salts (or betadine, or white vinegar) in a quart of warm tap water.  Submerge your foot or feet with outer bandage intact for the initial soak; this will allow the bandage to become moist and wet for easy lift off.  Once you remove your bandage, continue to soak in the solution for 20 minutes.  This soak should be done twice a day.  Next, remove your foot or feet from solution, blot dry the affected area and cover.  You may use a band aid large enough to cover the area or use gauze and tape.  Apply other medications to the area as directed by the doctor such as polysporin neosporin.  IF YOUR SKIN BECOMES IRRITATED WHILE USING THESE INSTRUCTIONS, IT IS OKAY TO SWITCH TO  WHITE VINEGAR AND WATER. Or you may use antibacterial soap and water to keep the toe clean  Monitor for any signs/symptoms of infection. Call the office immediately if any occur or go directly to the emergency room. Call with any questions/concerns.    Long Term Care Instructions-Post Nail Surgery  You have had your ingrown toenail and root treated with a chemical.  This chemical causes a burn that will drain and ooze like a blister.  This can drain for 6-8 weeks or longer.  It is important to keep this area clean, covered, and follow the soaking instructions dispensed at the time of your surgery.  This area will eventually dry and form a scab.  Once the scab forms you no longer need to soak or apply a dressing.  If at any time you experience an increase in pain, redness, swelling, or drainage, you should contact the office as soon as possible.'   We arranged for you to follow up at:  Follow-up Information     Laurey Morale, MD Follow up.   Specialty: Cardiology Why: We sent a message to Dr. Alford Highland office to help arrange heart failure clinic follow-up. They will call you with appointment information. Contact information: [address] [place] [address] [phone]                 Please note these changes made to your medications:   Please START taking:  Oxycodone 10mg  every 6 hours as need for pain Neosporin Ointment to toe daily with dressing changes.   Please STOP taking:  HYDROmorphone 4 MG tablet  Please make sure to let someone know if you have increased pain, discharge, fevers, or swelling of the toe     Please call our clinic if you have any questions or concerns, we may be able to help and keep you from a long and expensive emergency room wait. Our clinic and after hours phone number is [phone], the best time to call is Monday through Friday 9 am to 4 pm but there is always someone available 24/7 if you have an emergency. If you need medication refills please notify your pharmacy one week in advance and they will send [place] a request.

## 2023-09-09 NOTE — Consult Note (Signed)
PODIATRY CONSULTATION  NAME Phillip Young MRN [id number] DOB Feb 16, 1986 DOA 09/07/2023   Reason for consult:  Chief Complaint  Patient presents with   Shortness of Breath   Vascular Access Problem    Attending/Consulting physician: Donnie Aho MD  History of present illness: "38 y.o. male with multiple significant chronic conditions including ESRD with HD, HFrEF, Atrial fibrillation on Eliquis and chronic digoxin, Chronic respiratory failure 2/2 amiodarone toxicity, BMI 47, T2DM, and polyneuropathy returning from SNF d/t SOB after they were ostensibly unable to provide patient with medications / dialysis."  He reports purulent drainage from right hallux nail, been going on for a while. No pain, has dropfoot on the right foot with no feeling in the right great toe. Does not have podiatrist outpatient. Notes difficulty with his nail due to thickening / fungal infection.   Past Medical History:  Diagnosis Date   Acute on chronic respiratory failure with hypoxia ([name]) 04/21/2021   Acute on chronic systolic (congestive) heart failure ([name]) 02/26/2020   Amiodarone toxicity    Anemia    Atrial flutter ([name])    Biventricular congestive heart failure ([name])    Chronic hypoxemic respiratory failure ([name])    Class 3 severe obesity due to excess calories with serious comorbidity and body mass index (BMI) of 50.0 to 59.9 in adult [facility]) 02/26/2020   Essential hypertension 02/26/2020   GERD without esophagitis 02/26/2020   Hidradenitis suppurativa 02/26/2020   NICM (nonischemic cardiomyopathy) ([name])    Obesity hypoventilation syndrome ([name])    OSA (obstructive sleep apnea)    PAF (paroxysmal atrial fibrillation) ([name])    Pneumonia    Prediabetes 02/26/2020       Latest Ref Rng & Units 09/09/2023    4:48 AM 09/08/2023    3:39 AM 09/07/2023    2:52 PM  CBC  WBC 4.0 - 10.5 K/uL 7.7  7.8  9.2   Hemoglobin 13.0 - 17.0 g/dL 9.6  21.3  9.6   Hematocrit 39.0 - 52.0 % 29.6  30.8  31.8   Platelets  150 - 400 K/uL 182  176  182        Latest Ref Rng & Units 09/09/2023    4:48 AM 09/08/2023    3:39 AM 09/07/2023    2:52 PM  BMP  Glucose 70 - 99 mg/dL 086  578  469   BUN 6 - 20 mg/dL 69  86  81   Creatinine 0.61 - 1.24 mg/dL 6.29  52.84  1.32   Sodium 135 - 145 mmol/L 133  134  134   Potassium 3.5 - 5.1 mmol/L 4.6  4.7  4.9   Chloride 98 - 111 mmol/L 95  98  98   CO2 22 - 32 mmol/L 20  20  18    Calcium 8.9 - 10.3 mg/dL 9.7  9.7  9.6       Physical Exam: Lower Extremity Exam Vasc: R - PT 1/4 palpable, DP 2+ palpable. Cap refill < 3 sec to digits  L - PT palpable, DP palpable. Cap refill <3 sec to digits  Derm: R - Ingrown nail border medially and lateral 2/2 fungal infection with paronychia maceration and nail plate loosening.   L - Thickening of the left hallux nail borders, though no evidence of drainage/paronychia  MSK:  R -  No gross deformities. Compartments soft, non-tender, compressible  L -  No gross deformities. Compartments soft, non-tender, compressible  Neuro: R - Gross sensation absent toe great toe. Michaell Cowing  motor function absent regards to R foot dorsiflexion - dropfoot deformity present  L - Gross sensation absent to toe.      ASSESSMENT/PLAN OF CARE 38 y.o. male with PMHx significant for multiple significant chronic conditions including ESRD with HD, HFrEF, Atrial fibrillation on Eliquis and chronic digoxin, Chronic respiratory failure 2/2 amiodarone toxicity, BMI 47, T2DM, and polyneuropathy with right hallux ingrown nail with paronychia.   - Recommend total R hallux nail avulsion given ingrown with infection underlying the nail. He was in agreement with this plan. - leave the dressing I applied intact x 24 hours. Begin daily epsom salt soak followed by abx ointment to the nail bed and band aid application x 2 weeks starting tomorrow.  - Post op shoe as needed for right foot  At this time, recommended total right hallux nail removal without chemical  matricectomy to the right hallux due to infection. Risks and complications were discussed with the patient for which they understand and  verbally consent to the procedure. The patient did not require local anesthetic due to complete neuropathy of the right hallux. the skin was prepped in sterile fashion with betadine.  Next the total right hallux nail was freed with freer elevator. It was then manually grasped and removed. Once the nail was  Removed, the area was debrided and the underlying skin was intact. The area was irrigated and hemostasis was obtained. Abx ointment was applied.  A xeroform and gauze dry sterile dressing was applied. The patient tolerated the procedure well any complications. Post procedure instructions were discussed the patient for which he verbally understood. Follow-up in two week for nail check or sooner if any problems are to arise. Discussed signs/symptoms of worsening infection and directed to call the office immediately should any occur or go directly to the emergency room. In the meantime, encouraged to call the office with any questions, concerns, changes symptoms.  Soak Instructions    THE DAY AFTER THE PROCEDURE  Place 1/4 cup of epsom salts (or betadine, or white vinegar) in a quart of warm tap water.  Submerge your foot or feet with outer bandage intact for the initial soak; this will allow the bandage to become moist and wet for easy lift off.  Once you remove your bandage, continue to soak in the solution for 20 minutes.  This soak should be done twice a day.  Next, remove your foot or feet from solution, blot dry the affected area and cover.  You may use a band aid large enough to cover the area or use gauze and tape.  Apply other medications to the area as directed by the doctor such as polysporin neosporin.  IF YOUR SKIN BECOMES IRRITATED WHILE USING THESE INSTRUCTIONS, IT IS OKAY TO SWITCH TO  WHITE VINEGAR AND WATER. Or you may use antibacterial soap and water  to keep the toe clean  Monitor for any signs/symptoms of infection. Call the office immediately if any occur or go directly to the emergency room. Call with any questions/concerns.    Long Term Care Instructions-Post Nail Surgery  You have had your ingrown toenail and root treated with a chemical.  This chemical causes a burn that will drain and ooze like a blister.  This can drain for 6-8 weeks or longer.  It is important to keep this area clean, covered, and follow the soaking instructions dispensed at the time of your surgery.  This area will eventually dry and form a scab.  Once the scab forms  you no longer need to soak or apply a dressing.  If at any time you experience an increase in pain, redness, swelling, or drainage, you should contact the office as soon as possible.    Thank you for the consult.  Please contact me directly with any questions or concerns.           Corinna Gab, DPM [facility]    [address] [place], [place] [address]                Office [phone]  Fax [phone]

## 2023-09-09 NOTE — TOC Initial Note (Signed)
Transition of Care [facility]) - Initial/Assessment Note    Patient Details  Name: Phillip Young MRN: [id number] Date of Birth: Apr 01, 1986  Transition of [facility]) CM/SW Contact:    Delilah Shan, LCSWA Phone Number: 09/09/2023, 2:44 PM  Clinical Narrative:                  CSW received consult for possible SNF placement at time of discharge. CSW spoke with patient and patients mother at bedside regarding PT recommendation of SNF placement at time of discharge. Patient reports he comes from [facility] short term. Patient expressed understanding of PT recommendation and is agreeable to SNF placement at time of discharge. Patients number one choice is [facility], Second choice is [facility].Patient gave CSW permission to fax out to additional SNF facility's for possible SNF bed offers. CSW discussed insurance authorization process. No further questions reported at this time. CSW to continue to follow and assist with discharge planning needs.     Expected Discharge Plan: IP Rehab Facility Barriers to Discharge: Continued Medical Work up   Patient Goals and CMS Choice Patient states their goals for this hospitalization and ongoing recovery are:: SNF   Choice offered to / list presented to : Patient      Expected Discharge Plan and Services In-house Referral: Clinical Social Work     Living arrangements for the past 2 months: Single Family Home                                      Prior Living Arrangements/Services Living arrangements for the past 2 months: Single Family Home Lives with:: Parents (mom) Patient language and need for interpreter reviewed:: Yes Do you feel safe going back to the place where you live?: No   SNF  Need for Family Participation in Patient Care: Yes (Comment) Care giver support system in place?: Yes (comment)   Criminal Activity/Legal Involvement Pertinent to Current Situation/Hospitalization: No - Comment as needed  Activities  of Daily Living   ADL Screening (condition at time of admission) Independently performs ADLs?: No Does the patient have a NEW difficulty with bathing/dressing/toileting/self-feeding that is expected to last >3 days?: Yes (Initiates electronic notice to provider for possible OT consult) Does the patient have a NEW difficulty with getting in/out of bed, walking, or climbing stairs that is expected to last >3 days?: Yes (Initiates electronic notice to provider for possible PT consult) Does the patient have a NEW difficulty with communication that is expected to last >3 days?: No Is the patient deaf or have difficulty hearing?: No Does the patient have difficulty seeing, even when wearing glasses/contacts?: No Does the patient have difficulty concentrating, remembering, or making decisions?: No  Permission Sought/Granted Permission sought to share information with : Case Manager, Family Supports, Oceanographer granted to share information with : Yes, Verbal Permission Granted     Permission granted to share info w AGENCY: SNF        Emotional Assessment Appearance:: Appears stated age Attitude/Demeanor/Rapport: Gracious Affect (typically observed): Calm Orientation: : Oriented to Self, Oriented to Place, Oriented to  Time, Oriented to Situation Alcohol / Substance Use: Not Applicable Psych Involvement: No (comment)  Admission diagnosis:  Atrial fibrillation with RVR ([name]) [I48.91] ESRD (end stage renal disease) on dialysis ([name]) [N18.6, Z99.2] Patient Active Problem List   Diagnosis Date Noted   Paronychia of great toe of right foot  09/09/2023   Ingrown nail of great toe of right foot 09/09/2023   Ingrown toenail of right foot 09/08/2023   Atrial fibrillation with RVR ([name]) 09/07/2023   Encounter for screening involving social determinants of health (SDoH) 08/30/2023   Weakness of right foot 08/25/2023   ESRD needing dialysis ([name]) 07/02/2023    Hoarseness of voice 06/21/2023   Adjustment disorder with mixed anxiety and depressed mood 06/10/2023   Metabolic encephalopathy 06/07/2023   Severe muscle deconditioning 06/03/2023   HFrEF (heart failure with reduced ejection fraction) ([name]) 06/01/2023   Neuropathy 05/28/2023   MSSA bacteremia 05/26/2023   Hyperkalemia 03/21/2023   Central line infection 03/10/2023   Pressure injury of skin 01/23/2023   Acute hypoxic respiratory failure ([name]) 01/21/2023   Atrial fibrillation ([name]) 01/21/2023   Acute on chronic hypoxic respiratory failure ([name]) 08/07/2022   Congestive heart failure ([name]) 08/05/2022   Elevated liver enzymes 06/08/2022   Multiple nodules of lung 05/31/2022   History of Gram positive sepsis    Advanced care planning/counseling discussion    Recurrent infections 09/30/2021   Reactive depression 09/02/2021   Hypervolemia 08/18/2021   Type 2 diabetes mellitus with diabetic chronic kidney disease ([name]) 06/26/2021   Medical non-compliance 04/21/2021   Hypotension 04/21/2021   ESRD (end stage renal disease) on dialysis ([name]) 04/16/2021   Paroxysmal atrial flutter ([name]) 04/12/2021   SOB (shortness of breath)    Cardiogenic shock ([name])    Chronic respiratory failure with hypoxia ([name]) 02/11/2021   Chronic systolic CHF (congestive heart failure) ([name])    Chronic cough    Morbid obesity ([name]) 02/26/2020   GERD without esophagitis 02/26/2020   OSA (obstructive sleep apnea) 02/26/2020   Hidradenitis suppurativa 02/26/2020   PCP:  [name], DO Pharmacy:   [id number] [place], [place] - [address] AT [facility] OF [address] & [address] [place] [address] Phone: [phone] Fax: [phone]  [facility] [address] [place] [address] Phone: [phone] Fax: [phone]  [facility] [address] [place] [address] Phone:  [phone] Fax: [phone]  [facility] [place] [facility] [place] [place] - [address] [place] [address] Phone: [phone] Fax: [phone]     Social Drivers of Health (SDOH) Social History: SDOH Screenings   Food Insecurity: No Food Insecurity (09/08/2023)  Housing: Low Risk  (09/08/2023)  Transportation Needs: Unmet Transportation Needs (09/08/2023)  Utilities: Not At Risk (09/08/2023)  Alcohol Screen: Low Risk  (09/21/2022)  Depression (PHQ2-9): High Risk (06/21/2023)  Financial Resource Strain: Low Risk  (03/24/2023)   Received from [facility]  Physical Activity: Inactive (09/21/2022)  Social Connections: Moderately Isolated (03/24/2023)   Received from [facility]  Stress: No Stress Concern Present (04/21/2023)   Received from [facility]  Tobacco Use: Medium Risk (09/07/2023)   SDOH Interventions:     Readmission Risk Interventions    07/05/2023    4:25 PM 05/24/2023    4:43 PM 03/23/2023    3:48 PM  Readmission Risk Prevention Plan  Transportation Screening Complete Complete Complete  PCP or Specialist Appt within 5-7 Days  Complete   Home Care Screening  Complete   Medication Review (RN CM)  Referral to Pharmacy   HRI or Home Care Consult Complete    Social Work Consult for Recovery Care Planning/Counseling Complete    Palliative Care Screening Not Applicable  Medication Review Oceanographer) Complete  Complete  PCP or Specialist appointment within 3-5 days of discharge   --  HRI or Home Care Consult   Complete  SW Recovery Care/Counseling Consult   Complete  Palliative Care Screening   Not Applicable  Skilled Nursing Facility   Not Applicable

## 2023-09-09 NOTE — Plan of Care (Signed)
  Problem: Clinical Measurements: Goal: Respiratory complications will improve Outcome: Progressing Goal: Cardiovascular complication will be avoided Outcome: Progressing   Problem: Nutrition: Goal: Adequate nutrition will be maintained Outcome: Progressing   Problem: Pain Managment: Goal: General experience of comfort will improve and/or be controlled Outcome: Progressing   Problem: Safety: Goal: Ability to remain free from injury will improve Outcome: Progressing

## 2023-09-10 ENCOUNTER — Telehealth: Payer: Self-pay | Admitting: Physician Assistant

## 2023-09-10 DIAGNOSIS — Z992 Dependence on renal dialysis: Secondary | ICD-10-CM | POA: Diagnosis not present

## 2023-09-10 DIAGNOSIS — N186 End stage renal disease: Secondary | ICD-10-CM | POA: Diagnosis not present

## 2023-09-10 DIAGNOSIS — I4891 Unspecified atrial fibrillation: Secondary | ICD-10-CM | POA: Diagnosis not present

## 2023-09-10 LAB — CBC
HCT: 29.3 % — ABNORMAL LOW (ref 39.0–52.0)
Hemoglobin: 9.5 g/dL — ABNORMAL LOW (ref 13.0–17.0)
MCH: 33.2 pg (ref 26.0–34.0)
MCHC: 32.4 g/dL (ref 30.0–36.0)
MCV: 102.4 fL — ABNORMAL HIGH (ref 80.0–100.0)
Platelets: 193 10*3/uL (ref 150–400)
RBC: 2.86 MIL/uL — ABNORMAL LOW (ref 4.22–5.81)
RDW: 15.9 % — ABNORMAL HIGH (ref 11.5–15.5)
WBC: 8.7 10*3/uL (ref 4.0–10.5)
nRBC: 0 % (ref 0.0–0.2)

## 2023-09-10 LAB — RENAL FUNCTION PANEL
Albumin: 3.5 g/dL (ref 3.5–5.0)
Anion gap: 17 — ABNORMAL HIGH (ref 5–15)
BUN: 87 mg/dL — ABNORMAL HIGH (ref 6–20)
CO2: 20 mmol/L — ABNORMAL LOW (ref 22–32)
Calcium: 10 mg/dL (ref 8.9–10.3)
Chloride: 98 mmol/L (ref 98–111)
Creatinine, Ser: 9.98 mg/dL — ABNORMAL HIGH (ref 0.61–1.24)
GFR, Estimated: 6 mL/min — ABNORMAL LOW (ref >60)
Glucose, Bld: 116 mg/dL — ABNORMAL HIGH (ref 70–99)
Phosphorus: 7.7 mg/dL — ABNORMAL HIGH (ref 2.5–4.6)
Potassium: 5.4 mmol/L — ABNORMAL HIGH (ref 3.5–5.1)
Sodium: 135 mmol/L (ref 135–145)

## 2023-09-10 MED ORDER — LIDOCAINE-PRILOCAINE 2.5-2.5 % EX CREA: 1.0000 | TOPICAL_CREAM | CUTANEOUS | Status: DC | PRN

## 2023-09-10 MED ORDER — DARBEPOETIN ALFA 150 MCG/0.3ML IJ SOSY
150.0000 ug | PREFILLED_SYRINGE | INTRAMUSCULAR | Status: DC
  Administered 2023-09-11: 150 ug via SUBCUTANEOUS
  Filled 2023-09-10: qty 0.3

## 2023-09-10 MED ORDER — ALTEPLASE 2 MG IJ SOLR: 2.0000 mg | Freq: Once | INTRAMUSCULAR | Status: DC | PRN

## 2023-09-10 MED ORDER — SODIUM ZIRCONIUM CYCLOSILICATE 10 G PO PACK
10.0000 g | PACK | Freq: Once | ORAL | Status: AC
  Administered 2023-09-10: 10 g via ORAL
  Filled 2023-09-10: qty 1

## 2023-09-10 MED ORDER — LIDOCAINE HCL (PF) 1 % IJ SOLN: 5.0000 mL | INTRAMUSCULAR | Status: DC | PRN

## 2023-09-10 MED ORDER — ANTICOAGULANT SODIUM CITRATE 4% (200MG/5ML) IV SOLN
5.0000 mL | Freq: Once | Status: AC
  Administered 2023-09-10: 5 mL
  Filled 2023-09-10 (×2): qty 5

## 2023-09-10 MED ORDER — PENTAFLUOROPROP-TETRAFLUOROETH EX AERO: 1.0000 | INHALATION_SPRAY | CUTANEOUS | Status: DC | PRN

## 2023-09-10 NOTE — Evaluation (Signed)
Occupational Therapy Evaluation Patient Details Name: Phillip Young MRN: [id number] DOB: 1985/12/04 Today's Date: 09/10/2023   History of Present Illness   Patient is 38 y.o. male returning to ED from SNF on 09/07/23 for SOB, chest pounding, and non-compliance with medicine. Pt with recent admission from 08/30/23-09/06/23 after missing dialysis visit. Additionally, pt had the toenail of the R great toe removed 09/09/23. PMH: morbid obesity, HTN, prediabetes, OSA on home BiPAP, chronic respirator failure on 2L home oxygen, chronic systolic heart failure due to NICM, ESRD, and PAF.     Clinical Impressions Pt admitted for concerns listed above. PTA pt was at a SNF due to increased weakness and poor activity tolerance following prior hospitalization. Pt Continues to present with increased weakness and balance deficits, as well as poor activity tolerance, however he is very motivated to move more and get his strength back to be able to go back home and regain independence. Recommending intensive rehab (>3 hours a day) to maximize his independence and safety, as well as reduce caregiver burden. Acute OT will continue to follow.      If plan is discharge home, recommend the following:   A lot of help with walking and/or transfers;A lot of help with bathing/dressing/bathroom;Assistance with cooking/housework;Help with stairs or ramp for entrance     Functional Status Assessment   Patient has had a recent decline in their functional status and demonstrates the ability to make significant improvements in function in a reasonable and predictable amount of time.     Equipment Recommendations   None recommended by OT     Recommendations for Other Services         Precautions/Restrictions   Precautions Precautions: Fall Recall of Precautions/Restrictions: Intact Precaution/Restrictions Comments: Watch BP and HR Restrictions Weight Bearing Restrictions Per Provider Order: No      Mobility Bed Mobility               General bed mobility comments: Pt sitting EOB on arrival    Transfers Overall transfer level: Needs assistance Equipment used: Rolling walker (2 wheels) Transfers: Sit to/from Stand Sit to Stand: From elevated surface, Min assist           General transfer comment: Min A and increased time and effort from elevated bed      Balance Overall balance assessment: Needs assistance Sitting-balance support: No upper extremity supported, Single extremity supported, Feet supported Sitting balance-Leahy Scale: Good     Standing balance support: Bilateral upper extremity supported, During functional activity, Reliant on assistive device for balance Standing balance-Leahy Scale: Fair Standing balance comment: Pt able to let go of RW and maintain balance with supervision/CGA                           ADL either performed or assessed with clinical judgement   ADL Overall ADL's : Needs assistance/impaired Eating/Feeding: Independent;Sitting   Grooming: Set up;Sitting   Upper Body Bathing: Minimal assistance;Sitting   Lower Body Bathing: Maximal assistance;Sitting/lateral leans;Sit to/from stand   Upper Body Dressing : Contact guard assist;Sitting   Lower Body Dressing: Maximal assistance;Sitting/lateral leans;Sit to/from stand   Toilet Transfer: Moderate assistance;Stand-pivot   Toileting- Clothing Manipulation and Hygiene: Moderate assistance;Maximal assistance;Sitting/lateral lean;Sit to/from stand       Functional mobility during ADLs: Minimal assistance;Moderate assistance;+2 for safety/equipment General ADL Comments: Pt requires increased assist for LB ADL's and ADL's that require standing due to weakness and fatigue     Vision Baseline  Vision/History: 0 No visual deficits Ability to See in Adequate Light: 0 Adequate Patient Visual Report: No change from baseline Vision Assessment?: No apparent visual deficits      Perception         Praxis         Pertinent Vitals/Pain Pain Assessment Pain Assessment: 0-10 Pain Score: 5  Pain Location: BLE pain Pain Descriptors / Indicators: Burning, Sore, Grimacing Pain Intervention(s): Limited activity within patient's tolerance, Monitored during session, Repositioned     Extremity/Trunk Assessment Upper Extremity Assessment Upper Extremity Assessment: Overall WFL for tasks assessed   Lower Extremity Assessment Lower Extremity Assessment: Defer to PT evaluation   Cervical / Trunk Assessment Cervical / Trunk Assessment: Normal   Communication Communication Communication: No apparent difficulties   Cognition Arousal: Alert Behavior During Therapy: WFL for tasks assessed/performed Cognition: No apparent impairments                               Following commands: Intact       Cueing  General Comments   Cueing Techniques: Verbal cues;Visual cues      Exercises     Shoulder Instructions      Home Living Family/patient expects to be discharged to:: Private residence Living Arrangements: Parent Available Help at Discharge: Family;Available 24 hours/day Type of Home: Apartment Home Access: Stairs to enter;Level entry Entrance Stairs-Number of Steps: 15-20 to go downstairs to his basement apartment, or can go down a hill to the back for a level entrance Entrance Stairs-Rails: Right;Left;Can reach both Home Layout: One level     Bathroom Shower/Tub: Chief Strategy Officer: Handicapped height Bathroom Accessibility: Yes How Accessible: Accessible via walker Home Equipment: Wheelchair - Forensic psychologist (2 wheels);BSC/3in1;Hospital bed;Shower seat;Other (comment) (Hoyer lift)          Prior Functioning/Environment Prior Level of Function : Needs assist             Mobility Comments: uses RW for some transfers. Uses manual w/c for home & community navigation. ADLs Comments: Unable to drive,  uses w/c in community.    OT Problem List: Decreased strength;Decreased activity tolerance;Impaired balance (sitting and/or standing);Decreased knowledge of use of DME or AE;Cardiopulmonary status limiting activity;Impaired sensation;Obesity;Impaired UE functional use;Pain   OT Treatment/Interventions: Therapeutic exercise;Self-care/ADL training;Energy conservation;DME and/or AE instruction;Therapeutic activities;Patient/family education;Balance training      OT Goals(Current goals can be found in the care plan section)   Acute Rehab OT Goals Patient Stated Goal: To go home OT Goal Formulation: With patient Time For Goal Achievement: 09/24/23 Potential to Achieve Goals: Good ADL Goals Pt Will Perform Grooming: with contact guard assist;standing Pt Will Perform Lower Body Bathing: with min assist;with adaptive equipment;sit to/from stand;sitting/lateral leans Pt Will Perform Lower Body Dressing: with min assist;with adaptive equipment;sitting/lateral leans;sit to/from stand Pt Will Transfer to Toilet: with contact guard assist;stand pivot transfer Pt Will Perform Toileting - Clothing Manipulation and hygiene: with supervision;sitting/lateral leans;with adaptive equipment;sit to/from stand   OT Frequency:  Min 2X/week    Co-evaluation              AM-PAC OT "6 Clicks" Daily Activity     Outcome Measure Help from another person eating meals?: None Help from another person taking care of personal grooming?: A Little Help from another person toileting, which includes using toliet, bedpan, or urinal?: A Lot Help from another person bathing (including washing, rinsing, drying)?: A Lot Help from another person  to put on and taking off regular upper body clothing?: A Little Help from another person to put on and taking off regular lower body clothing?: A Lot 6 Click Score: 16   End of Session Equipment Utilized During Treatment: Rolling walker (2 wheels);Oxygen Nurse Communication:  Mobility status  Activity Tolerance: Patient tolerated treatment well Patient left: in bed;with call bell/phone within reach  OT Visit Diagnosis: Unsteadiness on feet (R26.81);Other abnormalities of gait and mobility (R26.89);Muscle weakness (generalized) (M62.81)                Time: 9562-1308 OT Time Calculation (min): 23 min Charges:  OT General Charges $OT Visit: 1 Visit OT Evaluation $OT Eval Moderate Complexity: 1 Mod OT Treatments $Therapeutic Activity: 8-22 mins  Trish Mage, OTR/L [facility]  [place] Wenk Elane Bing Plume 09/10/2023, 5:21 PM

## 2023-09-10 NOTE — Progress Notes (Signed)
HD#2 SUBJECTIVE:  Patient Summary: Phillip Young is a 38 y.o. male with multiple significant chronic conditions including ESRD with HD, HFrEF, atrial fibrillation on eliquis and chronic digoxin, chronic respiratory failure 2/2 amiodarone toxicity, BMI 47, T2DM, and polyneuropathy admitted for volume overload 2/2 missed HD and pain who is medically stable for discharge pending bed availability at [facility] re[facility] facility.  Overnight Events: NAEON  Interim History: Resting comfortably in bed. Expresses concerns over bleeding of his toe after wound dressing changed but understands he is at a higher bleeding risk and is reassured. No complaints or concerns today.  OBJECTIVE:  Vital Signs: Vitals:   09/09/23 0903 09/09/23 1949 09/09/23 2321 09/10/23 0505  BP: 114/89 106/77 (!) 110/99 124/84  Pulse: 64 (!) 101 (!) 101 (!) 104  Resp: 18 20 20 18   Temp:  98 F (36.7 C) 98 F (36.7 C) 98.5 F (36.9 C)  TempSrc:  Oral Axillary Axillary  SpO2: 96% 92% 92% 99%  Height:       Supplemental O2: Nasal Cannula SpO2: 99 % O2 Flow Rate (L/min): 6 L/min  Filed Weights     Intake/Output Summary (Last 24 hours) at 09/10/2023 1246 Last data filed at 09/10/2023 0600 Gross per 24 hour  Intake 1316 ml  Output --  Net 1316 ml   Net IO Since Admission: -1,184 mL [09/10/23 1246]  Physical Exam: Constitutional:Resting comfortably in bed. In no acute distress. Cardio:Increased rate but regular-sounding rhythm. No murmurs, rubs, or gallops. Pulm:Clear to auscultation bilaterally. Normal work of breathing on nasal cannula. Abdomen:Soft, nontender, nondistended. WUJ:[id number] for extremity edema. Skin:Warm and dry. Neuro:Alert and oriented x3. No focal deficit noted. Psych:Pleasant mood and affect.  Patient Lines/Drains/Airways Status     Active Line/Drains/Airways     Name Placement date Placement time Site Days   Peripheral IV 09/07/23 20 G Anterior;Right Forearm 09/07/23  --  Forearm   3   Hemodialysis Catheter Right Internal jugular Double lumen Permanent (Tunneled) 05/30/23  0856  Internal jugular  103   Wound / Incision (Open or Dehisced) 08/31/23 Other (Comment) Thigh Left;Posterior partial thickness blisters 08/31/23  --  Thigh  10             ASSESSMENT/PLAN:  Assessment: Principal Problem:   Atrial fibrillation with RVR ([name]) Active Problems:   Morbid obesity ([name])   OSA (obstructive sleep apnea)   Hidradenitis suppurativa   Chronic systolic CHF (congestive heart failure) ([name])   Chronic respiratory failure with hypoxia ([name])   ESRD (end stage renal disease) on dialysis ([name])   Hypotension   Neuropathy   Ingrown toenail of right foot   Paronychia of great toe of right foot   Ingrown nail of great toe of right foot   Plan: Atrial fibrillation with RVR Stable.  Plan: -Cardiology consulted, appreciate their recommendations -Not a candidate for ablation or pacemaker -Continue digoxin 0.125 mg 3 times weekly (MWF), ELIQUIS 5 MG bid -Telemetry moniroting -Will follow up as outpatient; no additional inpatient needs   ESRD on HD MWF Chronic hypotension ESRD anemia Underwent HD yesterday, tolerated well with 4.2L removed. Renal function and electrolytes this morning stable. Plan: -Nephrology consulted, appreciate their assistance -Continue midodrine 30 mg TID with meals -Continue vitamin D supplementation 400 units daily, cinacalcet 30 mg TTSa, renvela 1600 mg TID with meals   Disposition Planning Functional deconditioning BMI 47 Pending open bed at [facility], possibly tomorrow 02/17.   Chronic hypoxic respiratory failure Suspected secondary to amiodarone toxicity Stable. Plan: -Titrate  oxygen to room air as tolerated, goal SpO2 >90%   Chronic hypoxic respiratory failure HFrEF (EF <20%) Clinically euvolemic; volume managed with HD given lack of GDMT tolerance.  Plan: -Continue digoxin 0.125 mg MWF -Continue HD per nephrology    Chronic somatic and neuropathic pain Chronic opiate therapy Plan: -Continue tylenol 100 mg QID, pregabalin 50 mg daily, venlafaxine 37.5 mg daily, oxycodone 10 mg q6h PRN   Sacral decubitus ulcer, stage IV Plan: -Continue wound care per orders -RD consulted   Hidradenitis Suppurativa  Plan: -Continue tylenol 1000 mg q6h, triamcinolone cream over L lateral thigh with foam dressing   Hypothyroidism Plan: -Continue levothyroxine 25 mcg daily   R great toe wound, c/f ingrown toenail Plan: -Podiatry consulted, appreciate their assistance  -Wound care orders with their recommendations in place  Best Practice: Diet: Renal diet IVF: None VTE: Eliquis Code: Full AB: None DISPO: Anticipated discharge tomorrow to Rehab pending  available bed .  Signature: Champ Mungo, D.O.  Internal Medicine Resident, PGY-2 [facility]  Pager: [phone]   Please contact the on call pager after 5 pm and on weekends at [phone].

## 2023-09-10 NOTE — Progress Notes (Signed)
Cardiology Progress Note  Patient ID: Phillip Young MRN: [id number] DOB: [birth year] Date of Encounter: 09/10/2023 Primary Cardiologist: Marca Ancona, MD  Subjective   Chief Complaint: none.   HPI: Afib rates fairly well-controlled.  No real options based on review of records.  It is not ideal to have a dialysis patient on digoxin but he is without options.  ROS:  All other ROS reviewed and negative. Pertinent positives noted in the HPI.     Telemetry  Overnight telemetry shows A-fib heart rates 90 to 120 bpm, which I personally reviewed.     Physical Exam   Vitals:   09/09/23 0903 09/09/23 1949 09/09/23 2321 09/10/23 0505  BP: 114/89 106/77 (!) 110/99 124/84  Pulse: 64 (!) 101 (!) 101 (!) 104  Resp: 18 20 20 18   Temp:  98 F (36.7 C) 98 F (36.7 C) 98.5 F (36.9 C)  TempSrc:  Oral Axillary Axillary  SpO2: 96% 92% 92% 99%  Height:        Intake/Output Summary (Last 24 hours) at 09/10/2023 1204 Last data filed at 09/10/2023 0600 Gross per 24 hour  Intake 1316 ml  Output --  Net 1316 ml       09/10/2023    5:00 AM 09/09/2023    5:00 AM 09/08/2023    1:00 PM  Last 3 Weights  Weight (lbs) -- -- --  Weight (kg) -- -- --    Body mass index is 49.23 kg/m.  General: Well nourished, well developed, in no acute distress Head: Atraumatic, normal size  Eyes: PEERLA, EOMI  Neck: Supple, no JVD Endocrine: No thryomegaly Cardiac: Normal S1, S2; irregular rhythm, no murmurs rubs or gallops Lungs: Clear to auscultation bilaterally, no wheezing, rhonchi or rales  Abd: Soft, nontender, no hepatomegaly  Ext: No edema, pulses 2+ Musculoskeletal: No deformities, BUE and BLE strength normal and equal Skin: Warm and dry, no rashes   Neuro: Alert and oriented to person, place, time, and situation, CNII-XII grossly intact, no focal deficits  Psych: Normal mood and affect   Cardiac Studies  TTE 09/08/2023  1. Based on VTI and aortic valve excursion cardiac output is severely   reduced.. Left ventricular ejection fraction, by estimation, is <20%. The  left ventricle has severely decreased function. The left ventricle has  global hypokinesis. The left  ventricular internal cavity size was moderately dilated. Left ventricular  diastolic function could not be evaluated due to atrial fibrillation but  is likely abnormal.   2. Right ventricular systolic function is moderately reduced. The right  ventricular size is mildly enlarged. There is mildly elevated pulmonary  artery systolic pressure.   3. Left atrial size was moderately dilated.   4. The mitral valve is normal in structure. Mild mitral valve  regurgitation. No evidence of mitral stenosis.   5. Tricuspid valve regurgitation is moderate.   6. The aortic valve is normal in structure. Aortic valve regurgitation is  not visualized. No aortic stenosis is present.   7. Severely dilated pulmonary artery.   Patient Profile  Phillip Young is a 38 y.o. male with systolic heart failure (end-stage cardiomyopathy), ESRD, morbid obesity, primary atrial fibrillation (limited options for rate control due to numerous medication side effects), diabetes, OSA admitted on 09/07/2023 for volume overload and A-fib with RVR.  Assessment & Plan   # Permanent atrial fibrillation -Intolerant to amiodarone and AV nodal agents.  He has very limited options for rate control.  He is on dialysis but on digoxin.  This  is his only option per review of records.  Would recommend he continue this as has been prescribed by his primary cardiologist. -Other options could consider ablation and pacemaker but given his end-stage cardiomyopathy ESRD not an ideal candidate.  Very difficult situation. -For now continue Eliquis 5 mg twice daily.  Continue digoxin 0.125 mg Monday Wednesdays and Fridays.  # Chronic systolic heart failure, EF less than 20% # End-stage cardiomyopathy -Very limited options for GDMT.  Thought is that his cardiomyopathy is  end-stage.  Is refusing palliative care.  Really cannot tolerate any GDMT. -Volume management per hemodialysis. -Not a candidate for LVAD or cardiac transplantation. -Supportive care with volume removal.  # ESRD # Morbid obesity -Per primary team  [facility] will sign off.   Medication Recommendations: As above Other recommendations (labs, testing, etc): None Follow up as an outpatient: 1 to 2 weeks in heart failure clinic  For questions or updates, please contact [facility] Please consult [email or web address] for contact info under   Signed, Gerri Spore T. Flora Lipps, MD, [facility]  [facility]  09/10/2023 12:04 PM

## 2023-09-10 NOTE — Progress Notes (Cosign Needed Addendum)
HD#2 Subjective:   Summary: Phillip Young is a 38 y.o. malewith multiple significant chronic conditions including ESRD with HD, HFrEF, Atrial fibrillation on Eliquis and chronic digoxin, Chronic respiratory failure 2/2 amiodarone toxicity, BMI 47, T2DM, and polyneuropathy returning from SNF d/t SOB after they were ostensibly unable to provide patient with medications / dialysis.  Overnight Events: None  Was unable to go to dialysis yesterday. Toenail removed with Dr. Annamary Rummage. He tolerated this well and I appreciate Dr. Higinio Plan help. He continues to feel better with his breathing.    Objective:  Vital signs in last 24 hours: Vitals:   09/09/23 0903 09/09/23 1949 09/09/23 2321 09/10/23 0505  BP: 114/89 106/77 (!) 110/99 124/84  Pulse: 64 (!) 101 (!) 101 (!) 104  Resp: 18 20 20 18   Temp:  98 F (36.7 C) 98 F (36.7 C) 98.5 F (36.9 C)  TempSrc:  Oral Axillary Axillary  SpO2: 96% 92% 92% 99%  Height:       Supplemental O2: Nasal Cannula SpO2: 99 % O2 Flow Rate (L/min): 6 L/min   Physical Exam:  Constitutional: In no acute distress Cardiovascular:irregular rate and rhythm Pulmonary/Chest: Clear to auscultation bilaterally. No appreciable crackles.  Abdominal: soft, non-tender MSK: Anterior R chest with HD cath in place without surrounding erythema Neurological: alert & oriented  Skin: Right toe great toe bandaged today.  Psych: Pleasant mood and affect  Filed Weights      Intake/Output Summary (Last 24 hours) at 09/10/2023 1108 Last data filed at 09/10/2023 0600 Gross per 24 hour  Intake 1316 ml  Output --  Net 1316 ml   Net IO Since Admission: -1,184 mL [09/10/23 1108]  Pertinent Labs:    Latest Ref Rng & Units 09/09/2023    4:48 AM 09/08/2023    3:39 AM 09/07/2023    2:52 PM  CBC  WBC 4.0 - 10.5 K/uL 7.7  7.8  9.2   Hemoglobin 13.0 - 17.0 g/dL 9.6  16.1  9.6   Hematocrit 39.0 - 52.0 % 29.6  30.8  31.8   Platelets 150 - 400 K/uL 182  176  182         Latest Ref Rng & Units 09/10/2023    4:27 AM 09/09/2023    4:48 AM 09/08/2023    3:39 AM  CMP  Glucose 70 - 99 mg/dL 096  045  409   BUN 6 - 20 mg/dL 87  69  86   Creatinine 0.61 - 1.24 mg/dL 8.11  9.14  78.29   Sodium 135 - 145 mmol/L 135  133  134   Potassium 3.5 - 5.1 mmol/L 5.4  4.6  4.7   Chloride 98 - 111 mmol/L 98  95  98   CO2 22 - 32 mmol/L 20  20  20    Calcium 8.9 - 10.3 mg/dL 56.2  9.7  9.7     Assessment/Plan:   Principal Problem:   Atrial fibrillation with RVR ([name]) Active Problems:   Morbid obesity ([name])   OSA (obstructive sleep apnea)   Hidradenitis suppurativa   Chronic systolic CHF (congestive heart failure) ([name])   Chronic respiratory failure with hypoxia ([name])   ESRD (end stage renal disease) on dialysis ([name])   Hypotension   Neuropathy   Ingrown toenail of right foot   Paronychia of great toe of right foot   Ingrown nail of great toe of right foot   Patient Summary:  Atrial fibrillation with RVR Cardiology consulted, appreciate their help.  Felt that RVR is 2-2 atrial stretch in setting of volume overload. Per cards - not a candidate for ablation currently. Tachy in the 100's this AM, but see charted HR's in the 60's yesterday. -Cardiology following, appreciate their input -Digoxin 0.125 mg 3 times weekly, Monday, Wednesday, Fridays -Eliquis 5 mg twice daily -Telemetry - Continue HD w/ high UF goals to optimize volume.  ESRD on HD MWF Chronic hypotension ESRD anemia On MWF schedule. Seen by nephrology and appreciate their help. He was unable to go yesterday, but should go to dialysis again today. Encouraged patient to stay for entire session to help with symptoms of uremia and volume. Patient took midodrine today - appreciate this. Encourage diet changes / fluid restriction. Phos and Potassium up today, should resolve with dialysis.  - Appreciate nephrology's management - Continue midodrine - Vit D supplementation - Continue Cinacalcet 30 mg  every Tuesday, Thursday, Saturday - Continue Renvela 1600 mg 3 times daily with meals  Disposition Planning Functional deconditioning BMI 47 Per patient - was unable to receive proper care at [facility] and returned after missed medications / dialysis. Improving SOB. He is accepted at [facility] and has had a good experience there prior.  - Appreciate SW continued efforts - Continue to engage with PT   Chronic hypoxic respiratory failure Suspected secondary to amiodarone toxicity Home supplemental O2, 2-4L. No crackles on exam, improving orthopnea, dyspnea.  - Wean with goal O2 sat >90%   AoCHRF HFrEF [EF <20%]  Last EF 30-35% in October 2024 with moderately decreased LV function and global hypokinesis. Echo this admission with challenging views, however EF estimated at <20% with global hypokinesis. GDMT limited due to hypotension. No crackles, endorses orthopnea - Continue with HD - Continue with Digoxin - Cardiology consulted in ED d/t a-fib w/ RVR.   Chronic somatic and neuropathic pain Chronic opiate therapy - Scheduled Tylenol 100 mg QID  - Venlafaxine 37.5mg  daily  - Lyrica 50mg  daily  - Oxycodone 10mg  q6hrs PRN    Sacral decubitus ulcer, stage IV - Foam dressing needs to be in place over sacral area.  - RD consult pending.    Hidradenitis Suppurativa  No active treatment and continues to endorse pain, which is exacerbated by limited mobility in bed - Tylenol 1000 mg q6hrs PRN  - Triamcinolone ointment over left lateral thigh  - Cover with foam dressing.    Hypothyroidism Repeat TSH today slightly increased. Prior TSH and FT4 within range. May reflect euthyroid sick. Repeat outpatient. - Continue Levothyroxine 25 mcg daily  R great toe wound, c/f ingrown toenail Noted at discharge 09/06/23. Toenail avulsion with podiatry tolerated well, help appreciated.  - Podiatry wound care recommendations ordered   Diet: Renal VTE: Eliquis Code: Full PT/OT recs: home; will  need assistance to be wheeled to and from the house to the car that takes him to HD   Dispo: Anticipated discharge to SNF on Monday after dialysis.   Lovie Macadamia MD Internal Medicine Resident PGY-1 Pager: [phone] Please contact the on call pager after 5 pm and on weekends at [phone].

## 2023-09-10 NOTE — Plan of Care (Signed)
  Problem: Clinical Measurements: Goal: Respiratory complications will improve Outcome: Progressing Goal: Cardiovascular complication will be avoided Outcome: Progressing   Problem: Safety: Goal: Ability to remain free from injury will improve Outcome: Progressing   Problem: Activity: Goal: Ability to tolerate increased activity will improve Outcome: Progressing   Problem: Cardiac: Goal: Ability to achieve and maintain adequate cardiopulmonary perfusion will improve Outcome: Progressing

## 2023-09-10 NOTE — Telephone Encounter (Signed)
Per Dr. Marylene Buerger request, patient needs 1-2 week follow-up with Dr. Arlyn Leak team. Please call patient with appt info. Thank you! Kriste Basque, PA with [facility]

## 2023-09-10 NOTE — Progress Notes (Signed)
[facility] Progress Note   Subjective:    Seen and examined patient at bedside. He did not get dialyzed yesterday due high patient census. Noted K+ is 5.4 but he is eating and drinking from the outside. No acute issues. Plan for HD either afternoon or tonight. SW is working on placement.  Objective Vitals:   09/09/23 0903 09/09/23 1949 09/09/23 2321 09/10/23 0505  BP: 114/89 106/77 (!) 110/99 124/84  Pulse: 64 (!) 101 (!) 101 (!) 104  Resp: 18 20 20 18   Temp:  98 F (36.7 C) 98 F (36.7 C) 98.5 F (36.9 C)  TempSrc:  Oral Axillary Axillary  SpO2: 96% 92% 92% 99%  Height:       Physical Exam General: Awake, alert, obese, on O2, NAD Lungs: Clear throughout. No wheeze, rales or rhonchi. Breathing is unlabored. Heart: RRR. No murmur, rubs or gallops.  Abdomen: Large, soft, non-tender  Lower extremities: Diffuse LE edema Neuro: AAOx3. Dialysis Access: R internal jugular TDC   Filed Weights    Intake/Output Summary (Last 24 hours) at 09/10/2023 1237 Last data filed at 09/10/2023 0600 Gross per 24 hour  Intake 1316 ml  Output --  Net 1316 ml    Additional Objective Labs: Basic Metabolic Panel: Recent Labs  Lab 09/08/23 0339 09/09/23 0448 09/10/23 0427  NA 134* 133* 135  K 4.7 4.6 5.4*  CL 98 95* 98  CO2 20* 20* 20*  GLUCOSE 132* 110* 116*  BUN 86* 69* 87*  CREATININE 10.33* 8.34* 9.98*  CALCIUM 9.7 9.7 10.0  PHOS 6.3* 6.3* 7.7*   Liver Function Tests: Recent Labs  Lab 09/07/23 1452 09/08/23 0339 09/09/23 0448 09/10/23 0427  AST 26  --   --   --   ALT 18  --   --   --   ALKPHOS 125  --   --   --   BILITOT 1.1  --   --   --   PROT 6.8  --   --   --   ALBUMIN 3.3* 3.3* 3.2* 3.5   No results for input(s): "LIPASE", "AMYLASE" in the last 168 hours. CBC: Recent Labs  Lab 09/05/23 0343 09/07/23 1452 09/08/23 0339 09/09/23 0448 09/10/23 1209  WBC 7.7 9.2 7.8 7.7 8.7  NEUTROABS  --  6.4  --   --   --   HGB 10.1* 9.6* 10.0* 9.6* 9.5*   HCT 30.8* 31.8* 30.8* 29.6* 29.3*  MCV 103.7* 110.8* 104.1* 101.7* 102.4*  PLT 203 182 176 182 193   Blood Culture    Component Value Date/Time   SDES BLOOD BLOOD RIGHT HAND 05/27/2023 0959   SPECREQUEST  05/27/2023 0959    BOTTLES DRAWN AEROBIC AND ANAEROBIC Blood Culture adequate volume   CULT  05/27/2023 0959    NO GROWTH 5 DAYS Performed at [facility], [address] [place], [place] [address]    REPTSTATUS 06/01/2023 FINAL 05/27/2023 6045    Cardiac Enzymes: No results for input(s): "CKTOTAL", "CKMB", "CKMBINDEX", "TROPONINI" in the last 168 hours. CBG: Recent Labs  Lab 09/05/23 2009 09/06/23 0723 09/06/23 1131 09/06/23 1708 09/08/23 0751  GLUCAP 104* 107* 104* 135* 121*   Iron Studies: No results for input(s): "IRON", "TIBC", "TRANSFERRIN", "FERRITIN" in the last 72 hours. Lab Results  Component Value Date   INR 2.0 (H) 06/10/2022   INR 2.8 (H) 06/09/2022   INR 2.9 (H) 06/07/2022   Studies/Results: No results found.  Medications:   acetaminophen  1,000 mg Oral  Q6H   apixaban  5 mg Oral BID   Chlorhexidine Gluconate Cloth  6 each Topical Q0600   cholecalciferol  400 Units Oral Daily   cinacalcet  30 mg Oral Q T,Th,Sa-HD   darbepoetin (ARANESP) injection - DIALYSIS  150 mcg Subcutaneous Q Sat-1800   digoxin  0.125 mg Oral Once per day on Monday Wednesday Friday   levothyroxine  25 mcg Oral Q0600   midodrine  30 mg Oral TID WC   mupirocin ointment   Topical TID   pregabalin  50 mg Oral Daily   sevelamer carbonate  1,600 mg Oral TID WC   triamcinolone ointment   Topical BID   venlafaxine XR  37.5 mg Oral Daily    Dialysis Orders:  [facility] MWF From dec 2024 -->  4.5h   2/2 bath  155.5kg  RIJ TDC Heparin none Micera every 2 weeks - last dose 08/24/23 Venofer 100mg  load - last dose 08/24/23. Last dose scheduled for 09/02/23   Renal-related home meds: - sensipar 30 every day - fosrenol 1 gm ac tid - midodrine 30 tid - others: effexor xr,  lyrica, T4, digoxin, eliquis  Assessment/Plan: Afib with RVR - On digoxin and Eliquis. Not a candidate for amiodarone or AV nodal ablation with PPM. Had multiple failed attempts at DCCV. Cardiology following. Chronic HFrEF - EF 30-35%. Manage fluid with HD Chronic hypoxic respiratory failure - on home O2. Will continue this here.  Chronic pain - On Tylenol, Lyrica, Dilaudid PRN, and Venlafaxine. Managed by primary ESRD -  on HD MWF. Received HD 2/13 and 3.2L was removed. He did not get dialysis yesterday due high patient census so he is scheduled for either this afternoon or tonight. K+ 5.4 and ordered Lokelma 10GM X 1. Chronic hypotension/volume  - On Midodrine 30mg  TID but he occasionally refuses this. Appears he's been taking his doses today. Continue to encourage regimen. Push UF as tolerated. Anemia of CKD - Hgb 10. Last dose given on 1/29. Scheduled to get Aranesp today Secondary Hyperparathyroidism -  Continue Sensipar and Renvela. Nutrition - Renal diet with fluid restriction. Advised to limit outside foods Dispo - SW is working on patient going to another SNF facility  Salome Holmes, NP [facility] 09/10/2023,12:37 PM  LOS: 2 days

## 2023-09-10 NOTE — Progress Notes (Signed)
Msg sent to HF clinic to arrange f/u per Dr Marylene Buerger request, they will call with appt info

## 2023-09-11 LAB — RENAL FUNCTION PANEL
Albumin: 3.4 g/dL — ABNORMAL LOW (ref 3.5–5.0)
Anion gap: 19 — ABNORMAL HIGH (ref 5–15)
BUN: 57 mg/dL — ABNORMAL HIGH (ref 6–20)
CO2: 20 mmol/L — ABNORMAL LOW (ref 22–32)
Calcium: 9.7 mg/dL (ref 8.9–10.3)
Chloride: 96 mmol/L — ABNORMAL LOW (ref 98–111)
Creatinine, Ser: 7.47 mg/dL — ABNORMAL HIGH (ref 0.61–1.24)
GFR, Estimated: 9 mL/min — ABNORMAL LOW (ref >60)
Glucose, Bld: 122 mg/dL — ABNORMAL HIGH (ref 70–99)
Phosphorus: 5.5 mg/dL — ABNORMAL HIGH (ref 2.5–4.6)
Potassium: 4.3 mmol/L (ref 3.5–5.1)
Sodium: 135 mmol/L (ref 135–145)

## 2023-09-11 MED ORDER — CHLORHEXIDINE GLUCONATE CLOTH 2 % EX PADS
6.0000 | MEDICATED_PAD | Freq: Every day | CUTANEOUS | Status: DC
  Administered 2023-09-12: 6 via TOPICAL

## 2023-09-11 MED ORDER — MAGNESIUM SULFATE (LAXATIVE) PO GRAN
30.0000 g | GRANULES | ORAL | Status: DC | PRN
  Filled 2023-09-11: qty 454

## 2023-09-11 MED ORDER — BACITRACIN-NEOMYCIN-POLYMYXIN OINTMENT TUBE
TOPICAL_OINTMENT | Freq: Two times a day (BID) | CUTANEOUS | Status: DC | PRN
  Filled 2023-09-11: qty 14

## 2023-09-11 NOTE — Progress Notes (Addendum)
[facility] Progress Note   Subjective:    Got HD late last night w/ 4.2 L off  BP's 124/90 today, HR 101- 130 Weighed standing up at 164kg (9kg over)  Objective Vitals:   09/10/23 2358 09/11/23 0047 09/11/23 1433 09/11/23 1456  BP: (!) 130/115 (!) 113/92 (!) 124/90   Pulse: (!) 105 (!) 110 (!) 105   Resp: 20 18    Temp: 97.8 F (36.6 C) 98 F (36.7 C) 98.1 F (36.7 C)   TempSrc: Oral Oral Oral   SpO2: 100% 92%    Weight:    (!) 164.3 kg  Height:       Physical Exam General: Awake, alert, obese, on 5 L [place] O2 Lungs: Clear throughout. No wheeze, rales or rhonchi. Breathing is unlabored. Heart: RRR. No murmur, rubs or gallops.  Abdomen: Large, soft, non-tender  Lower extremities: LE edema not severe 1+  Neuro: AAOx3. Dialysis Access: R internal jugular TDC   OP HD:  [facility] MWF 4.5h   2/2 bath  155.5kg  RIJ TDC Heparin none - mircera every 2 weeks - last dose 08/24/23 - venofer 100mg  load - last dose 08/24/23. Last dose scheduled for 09/02/23 - heparin allergy/ HIT - pack catheter w/ sod citrate   Renal-related home meds: - sensipar 30 every day - fosrenol 1 gm ac tid - midodrine 30 tid - others: effexor xr, lyrica, T4, digoxin, eliquis  Assessment/Plan: Afib with RVR - On digoxin and Eliquis. Not a candidate for amiodarone or AV nodal ablation with PPM. Had multiple failed attempts at [facility]V. Cardiology following. Chronic HFrEF - EF 30-35%. Manage fluid with HD Chronic hypoxic respiratory failure - on home O2. Uses 2L during day and 4L overnight. Will continue this here.  Chronic pain - On Tylenol, Lyrica, Dilaudid PRN, and Venlafaxine. Managed by primary ESRD -  on HD MWF. Received HD here x 2 and got 7.5 L off total. Is 9kg over by standing wt today. Max UF w/ HD tomorrow 6 L.  Chronic hypotension/volume  - volume as above. For bp taking midodrine 30mg  TID but he occasionally refuses this. Appears he's been taking his doses today. Continue to encourage  regimen.  Anemia of CKD - Hgb 10. Getting darbe sq weekly while here.  Secondary Hyperparathyroidism -  Continue Sensipar and Renvela. Nutrition - Renal diet with fluid restriction. Advised to limit outside foods Dispo - SW is working on patient going to another SNF facility  [employer]  MD  [facility] 09/11/2023, 4:02 PM  Recent Labs  Lab 09/09/23 0448 09/10/23 0427 09/10/23 1209 09/11/23 0319  HGB 9.6*  --  9.5*  --   ALBUMIN 3.2* 3.5  --  3.4*  CALCIUM 9.7 10.0  --  9.7  PHOS 6.3* 7.7*  --  5.5*  CREATININE 8.34* 9.98*  --  7.47*  K 4.6 5.4*  --  4.3    Inpatient medications:  acetaminophen  1,000 mg Oral Q6H   apixaban  5 mg Oral BID   Chlorhexidine Gluconate Cloth  6 each Topical Q0600   cholecalciferol  400 Units Oral Daily   cinacalcet  30 mg Oral Q T,Th,Sa-HD   darbepoetin (ARANESP) injection - DIALYSIS  150 mcg Subcutaneous Q Sat-1800   digoxin  0.125 mg Oral Once per day on Monday Wednesday Friday   levothyroxine  25 mcg Oral Q0600   midodrine  30 mg Oral TID WC   mupirocin ointment   Topical TID   pregabalin  50 mg Oral Daily   sevelamer carbonate  1,600 mg Oral TID WC   triamcinolone ointment   Topical BID   venlafaxine XR  37.5 mg Oral Daily    albuterol, guaiFENesin, menthol-cetylpyridinium, mouth rinse, oxyCODONE

## 2023-09-11 NOTE — Plan of Care (Signed)
  Problem: Clinical Measurements: Goal: Respiratory complications will improve Outcome: Progressing Goal: Cardiovascular complication will be avoided Outcome: Progressing   Problem: Activity: Goal: Risk for activity intolerance will decrease Outcome: Progressing   Problem: Pain Managment: Goal: General experience of comfort will improve and/or be controlled Outcome: Progressing   Problem: Safety: Goal: Ability to remain free from injury will improve Outcome: Progressing   Problem: Cardiac: Goal: Ability to achieve and maintain adequate cardiopulmonary perfusion will improve Outcome: Progressing

## 2023-09-11 NOTE — Progress Notes (Signed)
Patient refused use of BIPAP for the evening.  °

## 2023-09-11 NOTE — Progress Notes (Signed)
Inpatient Rehab Admissions Coordinator:   Per therapy recommendations,  patient was screened for CIR candidacy by Megan Salon, MS, CCC-SLP. At this time,I believe TOC is pursuing placement at [facility] or SNF. I will not pursue admission at [facility]. Please contact me any with questions.  Megan Salon, MS, CCC-SLP Rehab Admissions Coordinator  [phone] (celll) [phone] (office)

## 2023-09-12 ENCOUNTER — Inpatient Hospital Stay (HOSPITAL_COMMUNITY): Admission: RE | Admit: 2023-09-12 | Payer: Medicare Other | Source: Ambulatory Visit

## 2023-09-12 DIAGNOSIS — N186 End stage renal disease: Secondary | ICD-10-CM | POA: Diagnosis not present

## 2023-09-12 DIAGNOSIS — I4891 Unspecified atrial fibrillation: Secondary | ICD-10-CM | POA: Diagnosis not present

## 2023-09-12 DIAGNOSIS — I953 Hypotension of hemodialysis: Secondary | ICD-10-CM | POA: Diagnosis not present

## 2023-09-12 DIAGNOSIS — L600 Ingrowing nail: Secondary | ICD-10-CM | POA: Diagnosis not present

## 2023-09-12 LAB — CBC
HCT: 31.2 % — ABNORMAL LOW (ref 39.0–52.0)
Hemoglobin: 10.1 g/dL — ABNORMAL LOW (ref 13.0–17.0)
MCH: 33.2 pg (ref 26.0–34.0)
MCHC: 32.4 g/dL (ref 30.0–36.0)
MCV: 102.6 fL — ABNORMAL HIGH (ref 80.0–100.0)
Platelets: 242 10*3/uL (ref 150–400)
RBC: 3.04 MIL/uL — ABNORMAL LOW (ref 4.22–5.81)
RDW: 16 % — ABNORMAL HIGH (ref 11.5–15.5)
WBC: 10.2 10*3/uL (ref 4.0–10.5)
nRBC: 0 % (ref 0.0–0.2)

## 2023-09-12 LAB — RENAL FUNCTION PANEL
Albumin: 3.4 g/dL — ABNORMAL LOW (ref 3.5–5.0)
Anion gap: 18 — ABNORMAL HIGH (ref 5–15)
BUN: 78 mg/dL — ABNORMAL HIGH (ref 6–20)
CO2: 19 mmol/L — ABNORMAL LOW (ref 22–32)
Calcium: 9.7 mg/dL (ref 8.9–10.3)
Chloride: 97 mmol/L — ABNORMAL LOW (ref 98–111)
Creatinine, Ser: 9.22 mg/dL — ABNORMAL HIGH (ref 0.61–1.24)
GFR, Estimated: 7 mL/min — ABNORMAL LOW (ref >60)
Glucose, Bld: 92 mg/dL (ref 70–99)
Phosphorus: 7.8 mg/dL — ABNORMAL HIGH (ref 2.5–4.6)
Potassium: 5.5 mmol/L — ABNORMAL HIGH (ref 3.5–5.1)
Sodium: 134 mmol/L — ABNORMAL LOW (ref 135–145)

## 2023-09-12 MED ORDER — BACITRACIN-NEOMYCIN-POLYMYXIN OINTMENT TUBE: 1.0000 | TOPICAL_OINTMENT | Freq: Every day | CUTANEOUS | Status: DC

## 2023-09-12 MED ORDER — NEPRO/CARBSTEADY PO LIQD: 237.0000 mL | ORAL | Status: DC | PRN

## 2023-09-12 MED ORDER — ANTICOAGULANT SODIUM CITRATE 4% (200MG/5ML) IV SOLN
5.0000 mL | Status: DC | PRN
  Administered 2023-09-12: 3.8 mL

## 2023-09-12 MED ORDER — LIDOCAINE-PRILOCAINE 2.5-2.5 % EX CREA: 1.0000 | TOPICAL_CREAM | CUTANEOUS | Status: DC | PRN

## 2023-09-12 MED ORDER — CINACALCET HCL 30 MG PO TABS: 30.0000 mg | ORAL_TABLET | ORAL | 0 refills | Status: DC | PRN

## 2023-09-12 MED ORDER — OXYCODONE HCL 10 MG PO TABS: 10.0000 mg | ORAL_TABLET | Freq: Four times a day (QID) | ORAL | 0 refills | Status: DC | PRN

## 2023-09-12 MED ORDER — MAGNESIUM SULFATE (LAXATIVE) PO GRAN: 30.0000 g | GRANULES | ORAL | Status: DC | PRN

## 2023-09-12 MED ORDER — LIDOCAINE HCL (PF) 1 % IJ SOLN: 5.0000 mL | INTRAMUSCULAR | Status: DC | PRN

## 2023-09-12 MED ORDER — PENTAFLUOROPROP-TETRAFLUOROETH EX AERO: 1.0000 | INHALATION_SPRAY | CUTANEOUS | Status: DC | PRN

## 2023-09-12 MED ORDER — TRIAMCINOLONE ACETONIDE 0.1 % EX OINT: TOPICAL_OINTMENT | Freq: Two times a day (BID) | CUTANEOUS | Status: DC

## 2023-09-12 NOTE — TOC Transition Note (Addendum)
Transition of Care [facility]) - Discharge Note   Patient Details  Name: Phillip Young MRN: [id number] Date of Birth: 1986/06/01  Transition of Care [facility]) CM/SW Contact:  Gala Lewandowsky, RN Phone Number: 09/12/2023, 10:49 AM   Clinical Narrative:  Case Manager received notification that the patient will transition to [facility]-bed is available. Patient to have HD today and then transition post HD> Case Manager will prearrange [facility] for transport home. Patient did ask Case Manager to call his mother in regards to disposition. Once patient transitions to HD transportation to be arranged.    1345 09-12-23 Staff RN to call report to [phone] [facility] nursing station.    1420 09-12-23 Patient called [facility] to arrange for transport for [facility] for 5:30 pm. Staff Rn and MD are aware.   Final next level of care: IP Rehab Facility Barriers to Discharge: No Barriers Identified   Patient Goals and CMS Choice Patient states their goals for this hospitalization and ongoing recovery are:: plan to go to inpatient rehab today at [facility].   Choice offered to / list presented to : NA     Discharge Plan and Services Additional resources added to the After Visit Summary for   In-house Referral: Clinical Social Work Discharge Planning Services: CM Consult Post Acute Care Choice: IP Rehab            DME Agency: NA   Social Drivers of Health (SDOH) Interventions SDOH Screenings   Food Insecurity: No Food Insecurity (09/08/2023)  Housing: Low Risk  (09/08/2023)  Transportation Needs: Unmet Transportation Needs (09/08/2023)  Utilities: Not At Risk (09/08/2023)  Alcohol Screen: Low Risk  (09/21/2022)  Depression (PHQ2-9): High Risk (06/21/2023)  Financial Resource Strain: Low Risk  (03/24/2023)   Received from [facility]  Physical Activity: Inactive (09/21/2022)  Social Connections: Moderately Isolated (03/24/2023)   Received from [facility]  Stress: No Stress Concern Present (04/21/2023)   Received from [facility]  Tobacco Use: Medium Risk (09/07/2023)   Readmission Risk Interventions    07/05/2023    4:25 PM 05/24/2023    4:43 PM 03/23/2023    3:48 PM  Readmission Risk Prevention Plan  Transportation Screening Complete Complete Complete  PCP or Specialist Appt within 5-7 Days  Complete   Home Care Screening  Complete   Medication Review (RN CM)  Referral to Pharmacy   HRI or Home Care Consult Complete    Social Work Consult for Recovery Care Planning/Counseling Complete    Palliative Care Screening Not Applicable    Medication Review [employer]) Complete  Complete  PCP or Specialist appointment within 3-5 days of discharge   --  HRI or Home Care Consult   Complete  SW Recovery Care/Counseling Consult   Complete  Palliative Care Screening   Not Applicable  Skilled Nursing Facility   Not Applicable

## 2023-09-12 NOTE — Progress Notes (Signed)
PT Cancellation Note  Patient Details Name: Phillip Young MRN: [id number] DOB: 1985/09/03   Cancelled Treatment:    Reason Eval/Treat Not Completed: Patient at procedure or test/unavailable (Pt in HD.  Will return as able.)   Bevelyn Buckles 09/12/2023, 11:43 AM Elliett Guarisco M,PT Acute Rehab Services [phone]

## 2023-09-12 NOTE — Care Management Important Message (Signed)
Important Message  Patient Details  Name: Phillip Young MRN: [id number] Date of Birth: September 24, 1985   Important Message Given:  Yes - Medicare IM     Renie Ora 09/12/2023, 11:23 AM

## 2023-09-12 NOTE — Progress Notes (Signed)
Received patient in bed to unit.  Alert and oriented.  Informed consent signed and in chart.   TX duration: 2 hours and 41 minutes.  Patient terminated session early due to cramps.  AMA paperwork signed  Patient tolerated well.  Transported back to the room  Alert, without acute distress.  Hand-off given to patient's nurse.   Access used: R HD Cath Access issues: none  Total UF removed: 4.2L  Medication(s) given: Sodium citrate dwells lumens due to Heparin allergy   09/12/23 1500  Vitals  Temp 98 F (36.7 C)  Temp Source Oral  BP (!) 121/107  Pulse Rate 90  Resp 20  Oxygen Therapy  SpO2 100 %  O2 Device Nasal Cannula  O2 Flow Rate (L/min) 6 L/min  During Treatment Monitoring  Duration of HD Treatment -hour(s) 2.68 hour(s)  HD Safety Checks Performed Yes  Intra-Hemodialysis Comments See progress note (Patient terminated session due to cramping)  Dialysis Fluid Bolus Normal Saline  Bolus Amount (mL) 400 mL (Ranse back and 100 cc NS bolus given to help with cramps)  Post Treatment  Dialyzer Clearance Clear  Liters Processed 64.3  Fluid Removed (mL) 4200 mL  Tolerated HD Treatment No (Comment)  Post-Hemodialysis Comments Patient cramped off with 20 minutes left.  AMA paperwork signed to come off early  Hemodialysis Catheter Right Internal jugular Double lumen Permanent (Tunneled)  Placement Date/Time: 05/30/23 0856   Serial / Lot #: [id number]  Expiration Date: 09/23/27  Time Out: Correct patient;Correct site;Correct procedure  Maximum sterile barrier precautions: Hand hygiene;Cap;Mask;Sterile gown;Sterile gloves;Large sterile s...  Site Condition No complications  Blue Lumen Status Dead end cap in place;Flushed  Red Lumen Status Dead end cap in place;Flushed  Purple Lumen Status N/A  Catheter fill solution 4% Sodium Citrate  Catheter fill volume (Arterial) 1.9 cc  Catheter fill volume (Venous) 1.9  Dressing Type Transparent  Dressing Status Antimicrobial disc/dressing  in place;Clean, Dry, Intact  Interventions  (deaccessed)  Drainage Description None  Dressing Change Due 09/19/23  Post treatment catheter status Capped and Clamped     Stacie Glaze LPN [facility]

## 2023-09-12 NOTE — Progress Notes (Signed)
   09/12/23 0800   PT - Assessment/Plan  Follow Up Recommendations Acute inpatient rehab (3hours/day)   Agree with [facility] due to pt will benefit from therapy to address endurance and mobility issues prior to d/c home.  Kaysen Deal M,PT Acute Rehab Services [phone]

## 2023-09-12 NOTE — Plan of Care (Signed)
   Problem: Clinical Measurements: Goal: Ability to maintain clinical measurements within normal limits will improve Outcome: Progressing

## 2023-09-12 NOTE — Progress Notes (Signed)
[facility] Progress Note   Subjective:    Checked bed wt against standing wt we did yesterday and they matched exactly So we can trust this bed's scales He was 9 kg over yesterday, goal 5 L w/ HD today in progress  Objective Vitals:   09/12/23 1330 09/12/23 1400 09/12/23 1430 09/12/23 1500  BP: 102/83 109/75 103/72 (!) 121/107  Pulse: 94 87 100   Resp: 16 (!) 23 16   Temp:      TempSrc:      SpO2: 99% 100% 100%   Weight:      Height:       Physical Exam General: Awake, alert, obese, on 5 L [place] O2 Lungs: Clear throughout. No wheeze, rales or rhonchi. Breathing is unlabored. Heart: RRR. No murmur, rubs or gallops.  Abdomen: Large, soft, non-tender  Lower extremities: LE edema not severe 1+  Neuro: AAOx3. Dialysis Access: R internal jugular TDC   OP HD:  [facility] MWF 4.5h   2/2 bath  155.5kg  RIJ TDC Heparin none - mircera every 2 weeks - last dose 08/24/23 - venofer 100mg  load - last dose 08/24/23. Last dose scheduled for 09/02/23 - heparin allergy/ HIT - pack catheter w/ sod citrate   Renal-related home meds: - sensipar 30 every day - fosrenol 1 gm ac tid - midodrine 30 tid - others: effexor xr, lyrica, T4, digoxin, eliquis  Assessment/Plan: Afib with RVR - On digoxin and Eliquis. Not a candidate for amiodarone or AV nodal ablation with PPM. Had multiple failed attempts at DCCV. Cardiology following. Chronic HFrEF - EF 30-35%. Manage fluid with HD Chronic hypoxic respiratory failure - on home O2. Uses 2L during day and 4L overnight. Will continue this here.  Chronic pain - On Tylenol, Lyrica, Dilaudid PRN, and Venlafaxine. Managed by primary ESRD -  on HD MWF. Received HD here x 2 and got 7.5 L off total. Is 9kg over by standing wt today. Max UF w/ HD tomorrow 6 L.  Chronic hypotension/volume excess - volume as above. For bp taking midodrine 30mg  TID but he occasionally refuses this. Appears he's been taking his doses today. Is 9kg up by wts still. Goal  today is 5 L off.  Anemia of CKD - Hgb 10. Getting darbe sq weekly while here.  Secondary Hyperparathyroidism -  Continue Sensipar and Renvela. Nutrition - Renal diet with fluid restriction. Advised to limit outside foods Dispo - SW is working on patient going to another SNF facility  [employer]  MD  [facility] 09/12/2023, 3:05 PM  Recent Labs  Lab 09/10/23 1209 09/11/23 0319 09/12/23 0406 09/12/23 1334  HGB 9.5*  --   --  10.1*  ALBUMIN  --  3.4* 3.4*  --   CALCIUM  --  9.7 9.7  --   PHOS  --  5.5* 7.8*  --   CREATININE  --  7.47* 9.22*  --   K  --  4.3 5.5*  --     Inpatient medications:  acetaminophen  1,000 mg Oral Q6H   apixaban  5 mg Oral BID   Chlorhexidine Gluconate Cloth  6 each Topical Q0600   Chlorhexidine Gluconate Cloth  6 each Topical Q0600   cholecalciferol  400 Units Oral Daily   cinacalcet  30 mg Oral Q T,Th,Sa-HD   darbepoetin (ARANESP) injection - DIALYSIS  150 mcg Subcutaneous Q Sat-1800   digoxin  0.125 mg Oral Once per day on Monday Wednesday Friday   levothyroxine  25 mcg Oral Q0600   midodrine  30 mg Oral TID WC   mupirocin ointment   Topical TID   pregabalin  50 mg Oral Daily   sevelamer carbonate  1,600 mg Oral TID WC   triamcinolone ointment   Topical BID   venlafaxine XR  37.5 mg Oral Daily    anticoagulant sodium citrate     albuterol, anticoagulant sodium citrate, feeding supplement (NEPRO CARB STEADY), guaiFENesin, lidocaine (PF), lidocaine-prilocaine, magnesium sulfate, menthol-cetylpyridinium, neomycin-bacitracin-polymyxin, mouth rinse, oxyCODONE, pentafluoroprop-tetrafluoroeth

## 2023-09-12 NOTE — Discharge Summary (Signed)
Name: Phillip Young MRN: [id number] DOB: [birth year] 38 y.o. PCP: [name], DO  Date of Admission: 09/07/2023  2:24 PM Date of Discharge:  09/12/2023 Attending Physician: Dr.  Sol Blazing  DISCHARGE DIAGNOSIS:  Primary Problem: Atrial fibrillation with RVR [facility])   Hospital Problems: Principal Problem:   Atrial fibrillation with RVR ([name]) Active Problems:   Morbid obesity ([name])   OSA (obstructive sleep apnea)   Hidradenitis suppurativa   Chronic systolic CHF (congestive heart failure) ([name])   Chronic respiratory failure with hypoxia ([name])   ESRD (end stage renal disease) on dialysis ([name])   Hypotension   Neuropathy   Ingrown toenail of right foot   Paronychia of great toe of right foot   Ingrown nail of great toe of right foot   ESRD (end stage renal disease) ([name])    DISCHARGE MEDICATIONS:   Allergies as of 09/12/2023       Reactions   Amiodarone Other (See Comments)   Suspicion for amiodarone lung/hepatotoxicity   Coreg [carvedilol] Shortness Of Breath, Diarrhea   Wheezing    Heparin Other (See Comments)   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   apixaban 5 MG Tabs tablet Commonly known as: ELIQUIS Take 1 tablet (5 mg total) by mouth 2 (two) times daily.   cholecalciferol 10 MCG (400 UNIT) Tabs tablet Commonly known as: VITAMIN D3 Take 1 tablet (400 Units total) by mouth daily.    cinacalcet 30 MG tablet Commonly known as: SENSIPAR Take 1 tablet (30 mg total) by mouth every dialysis. What changed:  when to take this reasons to take this   Darbepoetin Alfa 150 MCG/0.3ML Sosy injection Commonly known as: ARANESP To be given during dialysis by [facility] per Nephrology. Not to be given to patient at SNF.   digoxin 0.125 MG tablet Commonly known as: LANOXIN Take 1 tablet (0.125 mg total) by mouth 3 (three) times a week. Monday, Wednesday, Friday (dialysis days)   Gerhardt's butt cream Crea Apply 1 Application topically 3 (three) times daily.   levothyroxine 25 MCG tablet Commonly known as: SYNTHROID Take 1 tablet (25 mcg total) by mouth daily at 6 (six) AM.   magnesium sulfate Gran Commonly known as: EPSOM SALT 30 g by Other route as needed (for toe wound).   midodrine 10 MG tablet Commonly known as: PROAMATINE Take 3 tablets (30 mg total) by mouth 3 (three) times daily with meals.   neomycin-bacitracin-polymyxin Oint Commonly known as: NEOSPORIN Apply 1 Application topically daily.   Oxycodone HCl 10 MG Tabs Take 1 tablet (10 mg total) by mouth every 6 (six) hours as needed for moderate pain (pain score 4-6) or severe pain (pain score 7-10).   pregabalin 50 MG capsule Commonly known as: LYRICA Take 1 capsule (50 mg total) by mouth daily. Take one capsule daily.  sevelamer carbonate 800 MG tablet Commonly known as: RENVELA Take 2 tablets (1,600 mg total) by mouth 3 (three) times daily with meals.   tirzepatide 2.5 MG/0.5ML injection vial Commonly known as: ZEPBOUND Inject 2.5 mg into the skin once a week.   triamcinolone ointment 0.1 % Commonly known as: KENALOG Apply topically 2 (two) times daily.   venlafaxine XR 37.5 MG 24 hr capsule Commonly known as: Effexor XR Take 1 capsule (37.5 mg total) by mouth daily.        DISPOSITION AND FOLLOW-UP:  Mr.Malcome Venning was discharged from [facility] in  [place] condition. At the hospital follow up visit please address:  Follow-up Recommendations: Consults: To follow up with HF clinic outpatient.  Labs: Basic Metabolic Profile Studies: None Medications: None  Follow-up Appointments:  Follow-up Information     Laurey Morale, MD Follow up.   Specialty: Cardiology Why: We sent a message to Dr. Alford Highland office to help arrange heart failure clinic follow-up. They will call you with appointment information. Contact information: [address] [place] [address] [phone]                 HOSPITAL COURSE:  Patient Summary: Phillip Young is a 37 y.o. malewith multiple significant chronic conditions including ESRD with HD, HFrEF, Atrial fibrillation on Eliquis and chronic digoxin, Chronic respiratory failure 2/2 amiodarone toxicity, BMI 47, T2DM, and polyneuropathy returning to [facility] from SNF d/t SOB after they were ostensibly unable to provide patient with medications / dialysis. He did undergo avulsion of the right great toenail d/t ingrown nail with superficial infection. His respiratory status and swelling continue to improve during his stay and will go to rehab facility to continue to work on strength.   Atrial fibrillation with RVR Cardiology consulted. Felt that RVR is 2-2 atrial stretch in setting of volume overload. Per cards - not a candidate for ablation, and medical management limited due to comorbidity.  -Digoxin 0.125 mg 3 times weekly, Monday, Wednesday, Fridays -Eliquis 5 mg twice daily - Continue HD w/ high UF goals to optimize volume.   ESRD on HD MWF Chronic hypotension ESRD anemia On MWF schedule. Better able to tolerate Midodrine this admission. Persistently high UF. Discussed fluid restriction and diet.  - Appreciate nephrology's management - Continue midodrine - Vit D supplementation - Continue Cinacalcet 30 mg every Tuesday, Thursday, Saturday - Continue Renvela 1600 mg 3 times daily with meals    Disposition Planning Functional deconditioning BMI 47 Per patient - was unable to receive proper care at [facility] and returned after missed medications / dialysis. Improving SOB. He is accepted at [facility] and has had a good experience there prior.  - Appreciate SW continued efforts - Continue to engage with PT   Chronic hypoxic respiratory failure Suspected secondary to amiodarone toxicity Home supplemental O2, 2-4L. No crackles on exam, improving orthopnea, dyspnea.  - Wean with goal O2 sat >90%   AoCHRF HFrEF [EF <20%]  Last EF 30-35% in October 2024 with moderately decreased LV function and global hypokinesis. Echo this admission with challenging views, however EF estimated at <20% with global hypokinesis. GDMT limited due to hypotension. No crackles, endorses orthopnea - Continue with HD - Continue with Digoxin - Cardiology consulted in ED d/t a-fib w/ RVR.   Chronic somatic and neuropathic pain Chronic opiate therapy - Scheduled Tylenol 100 mg QID  - Venlafaxine 37.5mg  daily  - Lyrica 50mg  daily  - Oxycodone 10mg  q6hrs PRN    Sacral  decubitus ulcer, stage IV - Foam dressing needs to be in place over sacral area.    Hidradenitis Suppurativa  No active treatment and continues to endorse pain, which is exacerbated by limited mobility in bed - Tylenol 1000 mg q6hrs PRN  - Triamcinolone ointment over left lateral thigh  - Cover with foam dressing.    Hypothyroidism Repeat TSH today slightly increased. Prior TSH and FT4 within range. May reflect euthyroid sick. Repeat outpatient. - Continue Levothyroxine 25 mcg daily   R great toe wound, c/f ingrown toenail Noted at discharge 09/06/23. Toenail avulsion with podiatry tolerated well, help appreciated.  - Podiatry wound care recommendations:  " Begin daily epsom salt soak followed by abx ointment to the nail bed and band aid application x 2 weeks. "    SUBJECTIVE:  Patient continues to feel well. Swelling improved.  Breathing better. Some concern over toenail oozing - this is normal. Discussed signs to look out for which may indicate infection. Overall, continues to improve. Will likely need additional dialysis sessions to  improve volume status. Says his LE sensation may be returning a bit. Oxycodone for pain helping, a bit better than dilaudid per patient.   Discharge Vitals:   BP (!) 115/91 (BP Location: Left Wrist)   Pulse 78   Temp 98.1 F (36.7 C) (Oral)   Resp 16   Ht 6' (1.829 m)   Wt (!) 164.3 kg   SpO2 92%   BMI 49.13 kg/m   OBJECTIVE:  Constitutional: In no acute distress Cardiovascular:irregular rate and rhythm Pulmonary/Chest: Clear to auscultation bilaterally. No appreciable crackles.  Abdominal: soft, non-tender MSK: Anterior R chest with HD cath in place without surrounding erythema Neurological: alert & oriented  Skin: Right toe great toe bandage in place.  Psych: Pleasant mood and affect  Pertinent Labs, Studies, and Procedures:     Latest Ref Rng & Units 09/10/2023   12:09 PM 09/09/2023    4:48 AM 09/08/2023    3:39 AM  CBC  WBC 4.0 - 10.5 K/uL 8.7  7.7  7.8   Hemoglobin 13.0 - 17.0 g/dL 9.5  9.6  38.7   Hematocrit 39.0 - 52.0 % 29.3  29.6  30.8   Platelets 150 - 400 K/uL 193  182  176        Latest Ref Rng & Units 09/12/2023    4:06 AM 09/11/2023    3:19 AM 09/10/2023    4:27 AM  CMP  Glucose 70 - 99 mg/dL 92  564  332   BUN 6 - 20 mg/dL 78  57  87   Creatinine 0.61 - 1.24 mg/dL 9.51  8.84  1.66   Sodium 135 - 145 mmol/L 134  135  135   Potassium 3.5 - 5.1 mmol/L 5.5  4.3  5.4   Chloride 98 - 111 mmol/L 97  96  98   CO2 22 - 32 mmol/L 19  20  20    Calcium 8.9 - 10.3 mg/dL 9.7  9.7  06.3     ECHOCARDIOGRAM COMPLETE Result Date: 09/08/2023    ECHOCARDIOGRAM REPORT   Patient Name:   KARTIK FERNANDO Date of Exam: 09/08/2023 Medical Rec #:  [id number]      Height:       72.0 in Accession #:    [id number]     Weight:       363.0 lb Date of Birth:  06/17/1986       BSA:  2.745 m Patient Age:    37 years       BP:           111/84 mmHg Patient Gender: M              HR:           98 bpm. Exam Location:  Inpatient Procedure: 2D Echo, Cardiac Doppler and Color Doppler (Both Spectral and Color            Flow Doppler were utilized during procedure). Indications:     Atrial fibrillation  History:         Patient has prior history of Echocardiogram examinations, most                  recent 05/26/2023. CHF, COPD, Arrythmias:Atrial Flutter and                  Atrial Fibrillation, Signs/Symptoms:Shortness of Breath and                  Hypotension; Risk Factors:Diabetes and Sleep Apnea. ESRD.  Sonographer:     [name] Referring Phys:  [id number] Karl Ito Diagnosing Phys: Clearnce Hasten  Sonographer Comments: Image acquisition challenging due to patient body habitus, Image acquisition challenging due to uncooperative patient and patient persistently moving. IMPRESSIONS  1. Based on VTI and aortic valve excursion cardiac output is severely reduced.. Left ventricular ejection fraction, by estimation, is <20%. The left ventricle has severely decreased function. The left ventricle has global hypokinesis. The left ventricular internal cavity size was moderately dilated. Left ventricular diastolic function could not be evaluated due to atrial fibrillation but is likely abnormal.  2. Right ventricular systolic function is moderately reduced. The right ventricular size is mildly enlarged. There is mildly elevated pulmonary artery systolic pressure.  3. Left atrial size was moderately dilated.  4. The mitral valve is normal in structure. Mild mitral valve regurgitation. No evidence of mitral stenosis.  5. Tricuspid valve regurgitation is moderate.  6. The aortic valve is normal in structure. Aortic valve regurgitation is not visualized. No aortic stenosis is present.  7. Severely dilated pulmonary artery. FINDINGS  Left Ventricle: Based on VTI and aortic valve excursion cardiac  output is severely reduced. Left ventricular ejection fraction, by estimation, is <20%. The left ventricle has severely decreased function. The left ventricle demonstrates global hypokinesis. The left ventricular internal cavity size was moderately dilated. There is no left ventricular hypertrophy. Left ventricular diastolic function could not be evaluated due to atrial fibrillation. Left ventricular diastolic function could not be evaluated. Right Ventricle: The right ventricular size is mildly enlarged. No increase in right ventricular wall thickness. Right ventricular systolic function is moderately reduced. There is mildly elevated pulmonary artery systolic pressure. The tricuspid regurgitant velocity is 2.92 m/s, and with an assumed right atrial pressure of 8 mmHg, the estimated right ventricular systolic pressure is 42.1 mmHg. Left Atrium: Left atrial size was moderately dilated. Right Atrium: Right atrial size was normal in size. Pericardium: There is no evidence of pericardial effusion. Mitral Valve: The mitral valve is normal in structure. Severely decreased mobility of the mitral valve leaflets. Mild mitral valve regurgitation. No evidence of mitral valve stenosis. MV peak gradient, 4.3 mmHg. The mean mitral valve gradient is 2.0 mmHg. Tricuspid Valve: The tricuspid valve is normal in structure. Tricuspid valve regurgitation is moderate . No evidence of tricuspid stenosis. Aortic Valve: The aortic valve is normal in structure. Aortic valve regurgitation is not visualized.  No aortic stenosis is present. Aortic valve mean gradient measures 1.5 mmHg. Aortic valve peak gradient measures 1.8 mmHg. Aortic valve area, by VTI measures 1.36 cm. Pulmonic Valve: The pulmonic valve was normal in structure. Pulmonic valve regurgitation is trivial. No evidence of pulmonic stenosis. Aorta: The aortic root and ascending aorta are structurally normal, with no evidence of dilitation. Pulmonary Artery: The pulmonary artery  is severely dilated. Venous: The inferior vena cava was not well visualized. IAS/Shunts: No atrial level shunt detected by color flow Doppler. Additional Comments: 3D imaging was not performed.  LEFT VENTRICLE PLAX 2D LVIDd:         6.40 cm LVIDs:         5.70 cm LV PW:         1.20 cm LV IVS:        0.80 cm LVOT diam:     1.80 cm LV SV:         15 LV SV Index:   5 LVOT Area:     2.54 cm  LV Volumes (MOD) LV vol d, MOD A2C: 259.0 ml LV vol d, MOD A4C: 125.0 ml LV vol s, MOD A2C: 179.0 ml LV vol s, MOD A4C: 87.1 ml LV SV MOD A2C:     80.0 ml LV SV MOD A4C:     125.0 ml LV SV MOD BP:      71.3 ml RIGHT VENTRICLE            IVC RV Basal diam:  4.00 cm    IVC diam: 1.70 cm RV Mid diam:    3.20 cm RV S prime:     8.49 cm/s TAPSE (M-mode): 1.9 cm LEFT ATRIUM            Index        RIGHT ATRIUM           Index LA diam:      5.10 cm  1.86 cm/m   RA Area:     17.80 cm LA Vol (A2C): 78.4 ml  28.56 ml/m  RA Volume:   43.40 ml  15.81 ml/m LA Vol (A4C): 120.0 ml 43.72 ml/m  AORTIC VALVE                    PULMONIC VALVE AV Area (Vmax):    1.92 cm     PV Vmax:       0.70 m/s AV Area (Vmean):   1.45 cm     PV Peak grad:  2.0 mmHg AV Area (VTI):     1.36 cm AV Vmax:           67.35 cm/s AV Vmean:          51.550 cm/s AV VTI:            0.109 m AV Peak Grad:      1.8 mmHg AV Mean Grad:      1.5 mmHg LVOT Vmax:         50.70 cm/s LVOT Vmean:        29.300 cm/s LVOT VTI:          0.058 m LVOT/AV VTI ratio: 0.53  AORTA Ao Root diam: 3.00 cm Ao Asc diam:  2.80 cm MITRAL VALVE               TRICUSPID VALVE MV Area (PHT): 7.44 cm    TR Peak grad:   34.1 mmHg MV Area VTI:   1.44 cm  TR Vmax:        292.00 cm/s MV Peak grad:  4.3 mmHg MV Mean grad:  2.0 mmHg    SHUNTS MV Vmax:       1.04 m/s    Systemic VTI:  0.06 m MV Vmean:      65.4 cm/s   Systemic Diam: 1.80 cm MV Decel Time: 102 msec MR Peak grad: 68.9 mmHg MR Mean grad: 46.0 mmHg MR Vmax:      415.00 cm/s MR Vmean:     319.0 cm/s MV E velocity: 57.90 cm/s Clearnce Hasten  Electronically signed by Clearnce Hasten Signature Date/Time: 09/08/2023/8:30:13 AM    Final (Updated)    DG Toe Great Right Result Date: 09/08/2023 CLINICAL DATA:  Paronychia of great toe EXAM: RIGHT GREAT TOE COMPARISON:  None Available. FINDINGS: There is no evidence of fracture or dislocation. There is no evidence of arthropathy or other focal bone abnormality. Soft tissues are unremarkable. IMPRESSION: Negative. Electronically Signed   By: Charlett Nose M.D.   On: 09/08/2023 00:20   DG Chest Port 1 View Result Date: 09/07/2023 CLINICAL DATA:  Shortness of breath. EXAM: PORTABLE CHEST 1 VIEW COMPARISON:  08/30/2023, additional priors reviewed FINDINGS: Patient had difficulty with the exam, both costophrenic angles are not included in the field of view. Right-sided dialysis catheter in place. Chronic cardiomegaly. Unchanged mediastinal contours, globular appearance of the heart is a chronic finding. Mild vascular congestion. No confluent consolidation. Trace fluid in the fissures without significant sub pulmonic effusion. No pneumothorax. IMPRESSION: Chronic cardiomegaly. Mild vascular congestion. Trace fluid in the fissures without significant sub pulmonic effusion. Electronically Signed   By: Narda Rutherford M.D.   On: 09/07/2023 17:08     Signed: Lovie Macadamia MD Internal Medicine Resident, PGY-1 [facility]  Pager: [phone] 11:56 AM, 09/12/2023

## 2023-09-13 ENCOUNTER — Telehealth: Payer: Self-pay | Admitting: Nurse Practitioner

## 2023-09-13 NOTE — Discharge Planning (Signed)
[facility] Patient Discharge Orders- [facility] CLINIC: [facility]  Patient's name: Phillip Young Admit/DC Dates: 09/07/2023 - 09/12/2023  Discharge Diagnoses: AFIB RVR     Aranesp: Given: Yes   Date and amount of last dose: 150 mcg 09/11/2023  Last Hgb: 10.1 PRBC's Given: No Date/# of units: NA ESA dose for discharge: mircera 100 mcg IV q 2 weeks  IV Iron dose at discharge: per anemia protocol  Heparin change: No  EDW Change: No New EDW: NA  Bath Change: No  Access intervention/Change: No Details:  Hectorol/Calcitriol change: No  Discharge Labs: Calcium 9.7 Phosphorus 3.4 Albumin 3.4 K+ 5.5  IV Antibiotics: No Details:  On Coumadin?: No Last INR: Next INR: Managed By:   OTHER/APPTS/LAB ORDERS:    D/C Meds to be reconciled by nurse after every discharge.  Completed By: Alonna Buckler [facility] [phone]    Reviewed by: MD:______ RN_______

## 2023-09-13 NOTE — Progress Notes (Signed)
Late Note Entry- Sep 13, 2023  Contacted [facility] this morning to be advised that pt was d/c to [facility] inpt rehab yesterday and should resume care tomorrow.   Olivia Canter Renal Navigator [phone]

## 2023-09-13 NOTE — Telephone Encounter (Signed)
Transition of Care - Initial Contact from Inpatient Facility  Date of discharge:  Date of contact:  Method: Phone Spoke to: Patient  Patient contacted to discuss transition of care from recent inpatient hospitalization. Patient was admitted to [facility] from 09/07/2023 - 09/12/2023  with discharge diagnosis of AFIB RVR   The discharge medication list was reviewed. Patient understands the changes and has no concerns.   Patient will return to his/her outpatient HD unit on: 09/14/2023  No other concerns at this time.

## 2023-09-15 ENCOUNTER — Telehealth: Payer: Self-pay | Admitting: Surgery

## 2023-09-15 NOTE — Telephone Encounter (Signed)
ED RNCM received call from Victorino Dike  [phone] at [facility] concerning patient who may be enroute to [facility]. Informing us that they will take patient back once medically cleared for discharge.

## 2023-09-21 ENCOUNTER — Encounter (HOSPITAL_COMMUNITY): Payer: Self-pay | Admitting: Emergency Medicine

## 2023-09-21 ENCOUNTER — Inpatient Hospital Stay (HOSPITAL_COMMUNITY)
Admission: EM | Admit: 2023-09-21 | Discharge: 2023-09-29 | DRG: 091 | Disposition: A | Discharge status: Skilled Nursing Facility | Payer: Medicare Other | Source: Skilled Nursing Facility | Attending: Internal Medicine | Admitting: Internal Medicine

## 2023-09-21 ENCOUNTER — Other Ambulatory Visit: Payer: Self-pay

## 2023-09-21 ENCOUNTER — Emergency Department (HOSPITAL_COMMUNITY): Payer: Medicare Other

## 2023-09-21 DIAGNOSIS — D631 Anemia in chronic kidney disease: Secondary | ICD-10-CM | POA: Diagnosis present

## 2023-09-21 DIAGNOSIS — N186 End stage renal disease: Secondary | ICD-10-CM | POA: Diagnosis present

## 2023-09-21 DIAGNOSIS — F419 Anxiety disorder, unspecified: Secondary | ICD-10-CM | POA: Diagnosis present

## 2023-09-21 DIAGNOSIS — Z7901 Long term (current) use of anticoagulants: Secondary | ICD-10-CM

## 2023-09-21 DIAGNOSIS — I132 Hypertensive heart and chronic kidney disease with heart failure and with stage 5 chronic kidney disease, or end stage renal disease: Secondary | ICD-10-CM | POA: Diagnosis present

## 2023-09-21 DIAGNOSIS — E874 Mixed disorder of acid-base balance: Secondary | ICD-10-CM | POA: Diagnosis present

## 2023-09-21 DIAGNOSIS — I480 Paroxysmal atrial fibrillation: Secondary | ICD-10-CM | POA: Diagnosis present

## 2023-09-21 DIAGNOSIS — Z813 Family history of other psychoactive substance abuse and dependence: Secondary | ICD-10-CM

## 2023-09-21 DIAGNOSIS — I4892 Unspecified atrial flutter: Secondary | ICD-10-CM | POA: Diagnosis present

## 2023-09-21 DIAGNOSIS — L600 Ingrowing nail: Secondary | ICD-10-CM | POA: Diagnosis present

## 2023-09-21 DIAGNOSIS — F32A Depression, unspecified: Secondary | ICD-10-CM | POA: Diagnosis present

## 2023-09-21 DIAGNOSIS — I5022 Chronic systolic (congestive) heart failure: Secondary | ICD-10-CM | POA: Diagnosis present

## 2023-09-21 DIAGNOSIS — Z7985 Long-term (current) use of injectable non-insulin antidiabetic drugs: Secondary | ICD-10-CM

## 2023-09-21 DIAGNOSIS — G8929 Other chronic pain: Secondary | ICD-10-CM | POA: Diagnosis present

## 2023-09-21 DIAGNOSIS — R531 Weakness: Secondary | ICD-10-CM

## 2023-09-21 DIAGNOSIS — Z880 Allergy status to penicillin: Secondary | ICD-10-CM

## 2023-09-21 DIAGNOSIS — T462X5A Adverse effect of other antidysrhythmic drugs, initial encounter: Secondary | ICD-10-CM | POA: Diagnosis present

## 2023-09-21 DIAGNOSIS — Z992 Dependence on renal dialysis: Secondary | ICD-10-CM | POA: Diagnosis not present

## 2023-09-21 DIAGNOSIS — E1142 Type 2 diabetes mellitus with diabetic polyneuropathy: Secondary | ICD-10-CM | POA: Diagnosis present

## 2023-09-21 DIAGNOSIS — I428 Other cardiomyopathies: Secondary | ICD-10-CM | POA: Diagnosis present

## 2023-09-21 DIAGNOSIS — J9611 Chronic respiratory failure with hypoxia: Secondary | ICD-10-CM | POA: Diagnosis present

## 2023-09-21 DIAGNOSIS — I953 Hypotension of hemodialysis: Secondary | ICD-10-CM | POA: Diagnosis not present

## 2023-09-21 DIAGNOSIS — E039 Hypothyroidism, unspecified: Secondary | ICD-10-CM | POA: Diagnosis present

## 2023-09-21 DIAGNOSIS — Z79891 Long term (current) use of opiate analgesic: Secondary | ICD-10-CM

## 2023-09-21 DIAGNOSIS — Z888 Allergy status to other drugs, medicaments and biological substances status: Secondary | ICD-10-CM

## 2023-09-21 DIAGNOSIS — E1122 Type 2 diabetes mellitus with diabetic chronic kidney disease: Secondary | ICD-10-CM | POA: Diagnosis present

## 2023-09-21 DIAGNOSIS — I5082 Biventricular heart failure: Secondary | ICD-10-CM | POA: Diagnosis present

## 2023-09-21 DIAGNOSIS — Z6841 Body Mass Index (BMI) 40.0 and over, adult: Secondary | ICD-10-CM | POA: Diagnosis not present

## 2023-09-21 DIAGNOSIS — Z79899 Other long term (current) drug therapy: Secondary | ICD-10-CM

## 2023-09-21 DIAGNOSIS — E871 Hypo-osmolality and hyponatremia: Secondary | ICD-10-CM | POA: Diagnosis present

## 2023-09-21 DIAGNOSIS — E875 Hyperkalemia: Principal | ICD-10-CM | POA: Diagnosis present

## 2023-09-21 DIAGNOSIS — Z87891 Personal history of nicotine dependence: Secondary | ICD-10-CM

## 2023-09-21 DIAGNOSIS — K219 Gastro-esophageal reflux disease without esophagitis: Secondary | ICD-10-CM | POA: Diagnosis present

## 2023-09-21 DIAGNOSIS — Z8249 Family history of ischemic heart disease and other diseases of the circulatory system: Secondary | ICD-10-CM

## 2023-09-21 DIAGNOSIS — N2581 Secondary hyperparathyroidism of renal origin: Secondary | ICD-10-CM | POA: Diagnosis present

## 2023-09-21 DIAGNOSIS — I7090 Unspecified atherosclerosis: Secondary | ICD-10-CM | POA: Diagnosis not present

## 2023-09-21 DIAGNOSIS — Z91158 Patient's noncompliance with renal dialysis for other reason: Secondary | ICD-10-CM

## 2023-09-21 DIAGNOSIS — G928 Other toxic encephalopathy: Principal | ICD-10-CM | POA: Diagnosis present

## 2023-09-21 DIAGNOSIS — G9340 Encephalopathy, unspecified: Secondary | ICD-10-CM | POA: Diagnosis present

## 2023-09-21 DIAGNOSIS — Z79890 Hormone replacement therapy (postmenopausal): Secondary | ICD-10-CM

## 2023-09-21 DIAGNOSIS — L732 Hidradenitis suppurativa: Secondary | ICD-10-CM | POA: Diagnosis present

## 2023-09-21 LAB — I-STAT VENOUS BLOOD GAS, ED
Acid-base deficit: 8 mmol/L — ABNORMAL HIGH (ref 0.0–2.0)
Bicarbonate: 19.7 mmol/L — ABNORMAL LOW (ref 20.0–28.0)
Calcium, Ion: 1.01 mmol/L — ABNORMAL LOW (ref 1.15–1.40)
HCT: 37 % — ABNORMAL LOW (ref 39.0–52.0)
Hemoglobin: 12.6 g/dL — ABNORMAL LOW (ref 13.0–17.0)
O2 Saturation: 73 %
Potassium: 6 mmol/L — ABNORMAL HIGH (ref 3.5–5.1)
Sodium: 127 mmol/L — ABNORMAL LOW (ref 135–145)
TCO2: 21 mmol/L — ABNORMAL LOW (ref 22–32)
pCO2, Ven: 47.4 mm[Hg] (ref 44–60)
pH, Ven: 7.226 — ABNORMAL LOW (ref 7.25–7.43)
pO2, Ven: 46 mm[Hg] — ABNORMAL HIGH (ref 32–45)

## 2023-09-21 LAB — CBC
HCT: 34.9 % — ABNORMAL LOW (ref 39.0–52.0)
Hemoglobin: 11.2 g/dL — ABNORMAL LOW (ref 13.0–17.0)
MCH: 33.5 pg (ref 26.0–34.0)
MCHC: 32.1 g/dL (ref 30.0–36.0)
MCV: 104.5 fL — ABNORMAL HIGH (ref 80.0–100.0)
Platelets: 254 10*3/uL (ref 150–400)
RBC: 3.34 MIL/uL — ABNORMAL LOW (ref 4.22–5.81)
RDW: 17.1 % — ABNORMAL HIGH (ref 11.5–15.5)
WBC: 10.8 10*3/uL — ABNORMAL HIGH (ref 4.0–10.5)
nRBC: 0.6 % — ABNORMAL HIGH (ref 0.0–0.2)

## 2023-09-21 LAB — BRAIN NATRIURETIC PEPTIDE: B Natriuretic Peptide: 909.9 pg/mL — ABNORMAL HIGH (ref 0.0–100.0)

## 2023-09-21 LAB — I-STAT CG4 LACTIC ACID, ED: Lactic Acid, Venous: 8.5 mmol/L (ref 0.5–1.9)

## 2023-09-21 LAB — BASIC METABOLIC PANEL
Anion gap: 27 — ABNORMAL HIGH (ref 5–15)
BUN: 56 mg/dL — ABNORMAL HIGH (ref 6–20)
CO2: 17 mmol/L — ABNORMAL LOW (ref 22–32)
Calcium: 9.6 mg/dL (ref 8.9–10.3)
Chloride: 86 mmol/L — ABNORMAL LOW (ref 98–111)
Creatinine, Ser: 9.88 mg/dL — ABNORMAL HIGH (ref 0.61–1.24)
GFR, Estimated: 6 mL/min — ABNORMAL LOW (ref >60)
Glucose, Bld: 102 mg/dL — ABNORMAL HIGH (ref 70–99)
Potassium: 6.2 mmol/L — ABNORMAL HIGH (ref 3.5–5.1)
Sodium: 130 mmol/L — ABNORMAL LOW (ref 135–145)

## 2023-09-21 MED ORDER — ALBUTEROL SULFATE (2.5 MG/3ML) 0.083% IN NEBU
5.0000 mg | INHALATION_SOLUTION | Freq: Once | RESPIRATORY_TRACT | Status: AC
  Administered 2023-09-21: 5 mg via RESPIRATORY_TRACT
  Filled 2023-09-21: qty 6

## 2023-09-21 MED ORDER — LORAZEPAM 2 MG/ML IJ SOLN
1.0000 mg | Freq: Once | INTRAMUSCULAR | Status: AC
  Administered 2023-09-22: 1 mg via INTRAVENOUS
  Filled 2023-09-21: qty 1

## 2023-09-21 MED ORDER — CALCIUM GLUCONATE-NACL 1-0.675 GM/50ML-% IV SOLN
1.0000 g | Freq: Once | INTRAVENOUS | Status: AC
  Administered 2023-09-21: 1000 mg via INTRAVENOUS
  Filled 2023-09-21: qty 50

## 2023-09-21 MED ORDER — DEXTROSE 50 % IV SOLN
1.0000 | Freq: Once | INTRAVENOUS | Status: AC
  Administered 2023-09-21: 50 mL via INTRAVENOUS
  Filled 2023-09-21: qty 50

## 2023-09-21 MED ORDER — DIPHENHYDRAMINE HCL 50 MG/ML IJ SOLN
25.0000 mg | Freq: Once | INTRAMUSCULAR | Status: AC
  Administered 2023-09-21: 25 mg via INTRAVENOUS
  Filled 2023-09-21: qty 1

## 2023-09-21 MED ORDER — INSULIN ASPART 100 UNIT/ML IV SOLN
5.0000 [IU] | Freq: Once | INTRAVENOUS | Status: AC
  Administered 2023-09-21: 5 [IU] via INTRAVENOUS

## 2023-09-21 MED ORDER — SODIUM ZIRCONIUM CYCLOSILICATE 10 G PO PACK
10.0000 g | PACK | Freq: Three times a day (TID) | ORAL | Status: DC
  Administered 2023-09-22 (×2): 10 g via ORAL
  Filled 2023-09-21 (×2): qty 1

## 2023-09-21 NOTE — Progress Notes (Signed)
 Brief Nephrology Progress Note  Called by EDP patient presented with dyspnea and jerking movements.   He is well known to [facility] and me. I care for him as outpt.  Last outpt Tx was 09/18/22, 3h54min, leaving 7.3kg above outpt EDW, BPs normal.  K of 6.0 on 2/20  Here K is 6.2, HCO3 17, BUN 56, AG 27.  VBG with mixed resp and met acidosis.  pCXR w/o overt fluid.  Lactate elevaated 8.5.    VS appera stable forhim.   Plan for HD on schedule tonight or tomorrow.  Full consult to follow.  TDC, 4h, 3-4L UF, no heparin.

## 2023-09-21 NOTE — Progress Notes (Signed)
 Patient placed on BiPAP.  Tolerating well however very spastic flailing arms.

## 2023-09-21 NOTE — Progress Notes (Signed)
 RT began breathing tx after explaining to patient the tx patient told RT to stop tx.  Per patient " I am allergic to those."  Treatment stopped per patient request.

## 2023-09-21 NOTE — Progress Notes (Signed)
 RT tried to put up bed railing.  Patient would not let RT.

## 2023-09-21 NOTE — ED Provider Notes (Signed)
 [place] EMERGENCY DEPARTMENT AT [facility] Provider Note   CSN: [id number] Arrival date & time: 09/21/23  2043     History  Chief Complaint  Patient presents with   Medication Reaction    Phillip Young is a 38 y.o. male.  This is a 38 year old male presenting emergency department from his rehab facility complaining of shortness of breath after receiving Narcan.  Reportedly has history of chronic pain taking oxycodone and tramadol.  When they tried to discharge him he was not wanting to move leave so they gave him Narcan.  Complaining of shortness of breath and pain all over.        Home Medications Prior to Admission medications   Medication Sig Start Date End Date Taking? Authorizing Provider  acetaminophen (TYLENOL) 500 MG tablet Take 2 tablets (1,000 mg total) by mouth every 6 (six) hours. 09/06/23   Champ Mungo, DO  apixaban (ELIQUIS) 5 MG TABS tablet Take 1 tablet (5 mg total) by mouth 2 (two) times daily. 06/03/23   [name], DO  cholecalciferol (VITAMIN D3) 10 MCG (400 UNIT) TABS tablet Take 1 tablet (400 Units total) by mouth daily. 06/20/23   Setzer, Lynnell Jude, PA-C  cinacalcet (SENSIPAR) 30 MG tablet Take 1 tablet (30 mg total) by mouth every dialysis. 09/12/23   Lovie Macadamia, MD  Darbepoetin Alfa [place]) 150 MCG/0.3ML SOSY injection To be given during dialysis by [facility] per Nephrology. Not to be given to patient at SNF. 09/06/23   Lovie Macadamia, MD  digoxin (LANOXIN) 0.125 MG tablet Take 1 tablet (0.125 mg total) by mouth 3 (three) times a week. Monday, Wednesday, Friday (dialysis days) 07/22/23   Rocky Morel, DO  levothyroxine (SYNTHROID) 25 MCG tablet Take 1 tablet (25 mcg total) by mouth daily at 6 (six) AM. 07/21/23   Rocky Morel, DO  magnesium sulfate (EPSOM SALT) GRAN 30 g by Other route as needed (for toe wound). 09/12/23   Lovie Macadamia, MD  midodrine (PROAMATINE) 10 MG tablet Take 3 tablets (30 mg  total) by mouth 3 (three) times daily with meals. 06/20/23   Setzer, Lynnell Jude, PA-C  neomycin-bacitracin-polymyxin (NEOSPORIN) OINT Apply 1 Application topically daily. 09/12/23   Lovie Macadamia, MD  Nystatin (GERHARDT'S BUTT CREAM) CREA Apply 1 Application topically 3 (three) times daily. Patient not taking: Reported on 09/07/2023 05/24/23   [name], DO  Oxycodone HCl 10 MG TABS Take 1 tablet (10 mg total) by mouth every 6 (six) hours as needed. 09/12/23   Lovie Macadamia, MD  pregabalin (LYRICA) 50 MG capsule Take 1 capsule (50 mg total) by mouth daily. Take one capsule daily. 07/25/23   Champ Mungo, DO  sevelamer carbonate (RENVELA) 800 MG tablet Take 2 tablets (1,600 mg total) by mouth 3 (three) times daily with meals. 09/06/23   Champ Mungo, DO  tirzepatide (ZEPBOUND) 2.5 MG/0.5ML injection vial Inject 2.5 mg into the skin once a week. 08/25/23   Lovie Macadamia, MD  triamcinolone ointment (KENALOG) 0.1 % Apply topically 2 (two) times daily. 09/12/23   Lovie Macadamia, MD  venlafaxine XR (EFFEXOR XR) 37.5 MG 24 hr capsule Take 1 capsule (37.5 mg total) by mouth daily. 07/07/23 07/06/24  [name], DO      Allergies    Amiodarone, Coreg [carvedilol], Heparin, Metoprolol, Amoxicillin, and Other    Review of Systems   Review of Systems  Physical Exam Updated Vital Signs Ht 6' (1.829 m)   Wt (!) 161 kg   BMI 48.15 kg/m  Physical Exam Vitals and nursing note reviewed.  Constitutional:      Appearance: He is obese.  HENT:     Head: Normocephalic.     Nose: Nose normal.     Mouth/Throat:     Mouth: Mucous membranes are moist.  Eyes:     Conjunctiva/sclera: Conjunctivae normal.     Pupils: Pupils are equal, round, and reactive to light.  Cardiovascular:     Rate and Rhythm: Normal rate and regular rhythm.  Pulmonary:     Effort: Pulmonary effort is normal.     Breath sounds: Normal breath sounds.  Abdominal:     General: Abdomen is flat. There is no  distension.     Tenderness: There is no abdominal tenderness. There is no guarding or rebound.  Musculoskeletal:     Right lower leg: Edema present.     Left lower leg: Edema present.  Skin:    General: Skin is warm.     Capillary Refill: Capillary refill takes less than 2 seconds.  Neurological:     Mental Status: He is alert.     Comments: Patient is agitated.  Moving all extremities in a coordinated fashion.  No gross localizing neurodeficit.  Psychiatric:     Comments: Agitated     ED Results / Procedures / Treatments   Labs (all labs ordered are listed, but only abnormal results are displayed) Labs Reviewed  CBC - Abnormal; Notable for the following components:      Result Value   WBC 10.8 (*)    RBC 3.34 (*)    Hemoglobin 11.2 (*)    HCT 34.9 (*)    MCV 104.5 (*)    RDW 17.1 (*)    nRBC 0.6 (*)    All other components within normal limits  BASIC METABOLIC PANEL - Abnormal; Notable for the following components:   Sodium 130 (*)    Potassium 6.2 (*)    Chloride 86 (*)    CO2 17 (*)    Glucose, Bld 102 (*)    BUN 56 (*)    Creatinine, Ser 9.88 (*)    GFR, Estimated 6 (*)    Anion gap 27 (*)    All other components within normal limits  BRAIN NATRIURETIC PEPTIDE - Abnormal; Notable for the following components:   B Natriuretic Peptide 909.9 (*)    All other components within normal limits  I-STAT VENOUS BLOOD GAS, ED - Abnormal; Notable for the following components:   pH, Ven 7.226 (*)    pO2, Ven 46 (*)    Bicarbonate 19.7 (*)    TCO2 21 (*)    Acid-base deficit 8.0 (*)    Sodium 127 (*)    Potassium 6.0 (*)    Calcium, Ion 1.01 (*)    HCT 37.0 (*)    Hemoglobin 12.6 (*)    All other components within normal limits  I-STAT CG4 LACTIC ACID, ED - Abnormal; Notable for the following components:   Lactic Acid, Venous 8.5 (*)    All other components within normal limits  ACETAMINOPHEN LEVEL  HEPATITIS B SURFACE ANTIGEN  HEPATITIS B SURFACE ANTIBODY,  QUANTITATIVE  I-STAT CG4 LACTIC ACID, ED    EKG EKG Interpretation Date/Time:  Wednesday September 21 2023 21:26:44 EST Ventricular Rate:  103 PR Interval:  206 QRS Duration:  111 QT Interval:  350 QTC Calculation: 459 R Axis:   18  Text Interpretation: Sinus tachycardia Prolonged PR interval Probable left atrial enlargement Nonspecific T abnormalities,  lateral leads Confirmed by Estanislado Pandy [phone]) on 09/21/2023 10:41:55 PM  Radiology DG Chest Portable 1 View Result Date: 09/21/2023 CLINICAL DATA:  Shortness of breath EXAM: PORTABLE CHEST 1 VIEW COMPARISON:  09/07/2023 FINDINGS: Dialysis catheter is again seen and stable. Cardiac shadow is enlarged but also stable. Lungs are well aerated bilaterally. No focal infiltrate or effusion is seen. No bony abnormality is noted. IMPRESSION: Stable cardiomegaly.  No acute abnormality is seen. Electronically Signed   By: Alcide Clever M.D.   On: 09/21/2023 21:30    Procedures Procedures    Medications Ordered in ED Medications  sodium zirconium cyclosilicate (LOKELMA) packet 10 g (has no administration in time range)  LORazepam (ATIVAN) injection 1 mg (has no administration in time range)  diphenhydrAMINE (BENADRYL) injection 25 mg (25 mg Intravenous Given 09/21/23 2207)  calcium gluconate 1 g/ 50 mL sodium chloride IVPB (1,000 mg Intravenous New Bag/Given 09/21/23 2225)  insulin aspart (novoLOG) injection 5 Units (5 Units Intravenous Given 09/21/23 2220)    And  dextrose 50 % solution 50 mL (50 mLs Intravenous Given 09/21/23 2220)  albuterol (PROVENTIL) (2.5 MG/3ML) 0.083% nebulizer solution 5 mg (5 mg Nebulization Given 09/21/23 2303)    ED Course/ Medical Decision Making/ A&P Clinical Course as of 09/22/23 0000  Wed Sep 21, 2023  2206 Potassium(!): 6.2 ESRD. No ecg changes. Given insulin. Will reach out to nephrology. [TY]  2242 Spoke with nephrology; will attempt to arrange dialysis. Medical shift therapy in meantime.  [TY]     Clinical Course User Index [TY] Coral Spikes, DO                                 Medical Decision Making This is a 38 year old male with complicated past medical history to include morbid obesity, ESRD on dialysis, CHF, chronic pain presenting emergency department with complaint of shortness of breath.  Afebrile, nontachycardic, slight hypertension.  Lungs largely clear.  Chest x-ray without pulmonary edema.  Lab consistent with ESRD, does have elevated potassium.  Was placed on BiPAP shortly for comfort.  Nephrology consulted; see ED course.  Plan to admit patient for his hyperkalemia.  Amount and/or Complexity of Data Reviewed Labs: ordered. Decision-making details documented in ED Course. Radiology: ordered. ECG/medicine tests: ordered.  Risk OTC drugs. Prescription drug management. Decision regarding hospitalization.          Final Clinical Impression(s) / ED Diagnoses Final diagnoses:  Hyperkalemia  ESRD (end stage renal disease) [facility])    Rx / DC Orders ED Discharge Orders     None         Coral Spikes, DO 09/22/23 0000

## 2023-09-21 NOTE — ED Triage Notes (Addendum)
 Pt was given Narcan at [facility]. Pt started with SOB and has jerky movements. Pt is unable to hold still, very restless.

## 2023-09-22 ENCOUNTER — Other Ambulatory Visit (HOSPITAL_COMMUNITY): Payer: Self-pay

## 2023-09-22 DIAGNOSIS — G9340 Encephalopathy, unspecified: Secondary | ICD-10-CM | POA: Diagnosis present

## 2023-09-22 DIAGNOSIS — E875 Hyperkalemia: Secondary | ICD-10-CM | POA: Diagnosis not present

## 2023-09-22 LAB — BLOOD GAS, VENOUS
Acid-Base Excess: 1 mmol/L (ref 0.0–2.0)
Bicarbonate: 26.6 mmol/L (ref 20.0–28.0)
O2 Saturation: 38.5 %
Patient temperature: 36.5
pCO2, Ven: 44 mm[Hg] (ref 44–60)
pH, Ven: 7.39 (ref 7.25–7.43)
pO2, Ven: 34 mm[Hg] (ref 32–45)

## 2023-09-22 LAB — RENAL FUNCTION PANEL
Albumin: 3.3 g/dL — ABNORMAL LOW (ref 3.5–5.0)
Anion gap: 16 — ABNORMAL HIGH (ref 5–15)
BUN: 31 mg/dL — ABNORMAL HIGH (ref 6–20)
CO2: 25 mmol/L (ref 22–32)
Calcium: 9 mg/dL (ref 8.9–10.3)
Chloride: 91 mmol/L — ABNORMAL LOW (ref 98–111)
Creatinine, Ser: 6.63 mg/dL — ABNORMAL HIGH (ref 0.61–1.24)
GFR, Estimated: 10 mL/min — ABNORMAL LOW (ref >60)
Glucose, Bld: 62 mg/dL — ABNORMAL LOW (ref 70–99)
Phosphorus: 5.6 mg/dL — ABNORMAL HIGH (ref 2.5–4.6)
Potassium: 5.1 mmol/L (ref 3.5–5.1)
Sodium: 132 mmol/L — ABNORMAL LOW (ref 135–145)

## 2023-09-22 LAB — CBC
HCT: 27.5 % — ABNORMAL LOW (ref 39.0–52.0)
Hemoglobin: 9.1 g/dL — ABNORMAL LOW (ref 13.0–17.0)
MCH: 33.8 pg (ref 26.0–34.0)
MCHC: 33.1 g/dL (ref 30.0–36.0)
MCV: 102.2 fL — ABNORMAL HIGH (ref 80.0–100.0)
Platelets: 186 10*3/uL (ref 150–400)
RBC: 2.69 MIL/uL — ABNORMAL LOW (ref 4.22–5.81)
RDW: 17.1 % — ABNORMAL HIGH (ref 11.5–15.5)
WBC: 7.3 10*3/uL (ref 4.0–10.5)
nRBC: 0.4 % — ABNORMAL HIGH (ref 0.0–0.2)

## 2023-09-22 LAB — I-STAT CG4 LACTIC ACID, ED: Lactic Acid, Venous: 2.9 mmol/L (ref 0.5–1.9)

## 2023-09-22 LAB — ACETAMINOPHEN LEVEL: Acetaminophen (Tylenol), Serum: <10 ug/mL — ABNORMAL LOW (ref 10–30)

## 2023-09-22 LAB — HEPATITIS B SURFACE ANTIGEN: Hepatitis B Surface Ag: NONREACTIVE

## 2023-09-22 LAB — DIGOXIN LEVEL: Digoxin Level: 0.5 ng/mL — ABNORMAL LOW (ref 0.8–2.0)

## 2023-09-22 LAB — LACTIC ACID, PLASMA: Lactic Acid, Venous: 2.8 mmol/L (ref 0.5–1.9)

## 2023-09-22 MED ORDER — STERILE WATER FOR INJECTION IJ SOLN
INTRAMUSCULAR | Status: AC
  Administered 2023-09-22: 10 mL
  Filled 2023-09-22: qty 10

## 2023-09-22 MED ORDER — ACETAMINOPHEN 650 MG RE SUPP: 650.0000 mg | Freq: Four times a day (QID) | RECTAL | Status: DC | PRN

## 2023-09-22 MED ORDER — ONDANSETRON HCL 4 MG/2ML IJ SOLN: 4.0000 mg | Freq: Four times a day (QID) | INTRAMUSCULAR | Status: DC | PRN

## 2023-09-22 MED ORDER — ONDANSETRON HCL 4 MG PO TABS: 4.0000 mg | ORAL_TABLET | Freq: Four times a day (QID) | ORAL | Status: DC | PRN

## 2023-09-22 MED ORDER — ZIPRASIDONE MESYLATE 20 MG IM SOLR
20.0000 mg | Freq: Once | INTRAMUSCULAR | Status: AC
  Administered 2023-09-22: 20 mg via INTRAMUSCULAR
  Filled 2023-09-22: qty 20

## 2023-09-22 MED ORDER — ACETAMINOPHEN 325 MG PO TABS
650.0000 mg | ORAL_TABLET | Freq: Four times a day (QID) | ORAL | Status: DC | PRN
  Administered 2023-09-25: 650 mg via ORAL
  Filled 2023-09-22: qty 2

## 2023-09-22 MED ORDER — APIXABAN 5 MG PO TABS
5.0000 mg | ORAL_TABLET | Freq: Two times a day (BID) | ORAL | Status: DC
  Administered 2023-09-22 – 2023-09-29 (×14): 5 mg via ORAL
  Filled 2023-09-22 (×14): qty 1

## 2023-09-22 MED ORDER — DIGOXIN 125 MCG PO TABS
0.1250 mg | ORAL_TABLET | ORAL | Status: DC
  Administered 2023-09-23 – 2023-09-28 (×3): 0.125 mg via ORAL
  Filled 2023-09-22 (×3): qty 1

## 2023-09-22 MED ORDER — CHLORHEXIDINE GLUCONATE CLOTH 2 % EX PADS
6.0000 | MEDICATED_PAD | Freq: Every day | CUTANEOUS | Status: DC
  Administered 2023-09-24: 6 via TOPICAL

## 2023-09-22 MED ORDER — ANTICOAGULANT SODIUM CITRATE 4% (200MG/5ML) IV SOLN
5.0000 mL | Freq: Once | Status: AC
Start: 2023-09-22 — End: 2023-09-22
  Administered 2023-09-22: 5 mL
  Filled 2023-09-22: qty 5

## 2023-09-22 MED ORDER — LEVOTHYROXINE SODIUM 25 MCG PO TABS
25.0000 ug | ORAL_TABLET | Freq: Every day | ORAL | Status: DC
  Administered 2023-09-23 – 2023-09-29 (×7): 25 ug via ORAL
  Filled 2023-09-22 (×7): qty 1

## 2023-09-22 MED ORDER — CINACALCET HCL 30 MG PO TABS
30.0000 mg | ORAL_TABLET | ORAL | Status: DC
  Filled 2023-09-22 (×2): qty 1

## 2023-09-22 MED ORDER — PREGABALIN 25 MG PO CAPS
50.0000 mg | ORAL_CAPSULE | Freq: Every day | ORAL | Status: DC
  Administered 2023-09-22 – 2023-09-29 (×7): 50 mg via ORAL
  Filled 2023-09-22 (×7): qty 2

## 2023-09-22 MED ORDER — MIDODRINE HCL 5 MG PO TABS
30.0000 mg | ORAL_TABLET | Freq: Three times a day (TID) | ORAL | Status: DC
  Administered 2023-09-27: 30 mg via ORAL
  Filled 2023-09-22 (×8): qty 6

## 2023-09-22 MED ORDER — OXYCODONE HCL 5 MG PO TABS
10.0000 mg | ORAL_TABLET | Freq: Four times a day (QID) | ORAL | Status: DC | PRN
  Administered 2023-09-22 – 2023-09-29 (×15): 10 mg via ORAL
  Filled 2023-09-22 (×15): qty 2

## 2023-09-22 MED ORDER — LACTATED RINGERS IV SOLN: INTRAVENOUS | Status: DC

## 2023-09-22 NOTE — Progress Notes (Signed)
 Per Nurse Idalia Needle, Pt is just arriving back from HD and is in ED yellow zone hallway 25 and will not be getting a room anytime soon due to being very busy in ED. EEG will check back as schedule permits.

## 2023-09-22 NOTE — Consult Note (Addendum)
 [facility] Renal Consultation Note    Indication for Consultation:  Management of ESRD/hemodialysis; anemia, hypertension/volume and secondary hyperparathyroidism PCP: Dr. Gracy Bruins Nephrologist: Dr. Marisue Humble  HPI: Phillip Young is a 38 y.o. male with ESRD on hemodialysis MWF at [facility]. Chronic non-adherence to HD Rx-truncates every treatment, misses treatments. Last HD 09/19/2023, he stayed 3.22 hrs left 7.3 kg above OP EDW.  PMH: BiV HF, Atrial Flutter, Morbid obesity, NICM, OSA, chronic pain/opioid use.   He was brought to ED after being found minimally responsive at SNF. He was given [facility], brought to ED for abrupt onset of pain and SOB following NARCAN. He has been admitted for acute metabolic encephalopathy. He was noted to be have hyperkalemia-rec'd urgent HD overnight. I am seeing patient at end of treatment. He is minimally responsive, not responding to verbal or following commands. Apparently he received Geodon in ED for agitation. Unable to obtain PMH. No family present. PMH and history gathered from staff and EMR.   Addendum: Passed pt lying on stretcher in hallway ED while looking for another pt. He is now awake, calls me by name. Upset at [facility] staff for giving him [facility], Denies SOB. Asking if we know his pain medication regime. Assured him that primary will be managing.   Past Medical History:  Diagnosis Date   Acute on chronic respiratory failure with hypoxia ([name]) 04/21/2021   Acute on chronic systolic (congestive) heart failure ([name]) 02/26/2020   Amiodarone toxicity    Anemia    Atrial flutter ([name])    Biventricular congestive heart failure ([name])    Chronic hypoxemic respiratory failure ([name])    Class 3 severe obesity due to excess calories with serious comorbidity and body mass index (BMI) of 50.0 to 59.9 in adult [facility]) 02/26/2020   Essential hypertension 02/26/2020   GERD without esophagitis 02/26/2020   Hidradenitis suppurativa  02/26/2020   NICM (nonischemic cardiomyopathy) ([name])    Obesity hypoventilation syndrome ([name])    OSA (obstructive sleep apnea)    PAF (paroxysmal atrial fibrillation) ([name])    Pneumonia    Prediabetes 02/26/2020   Past Surgical History:  Procedure Laterality Date   ABSCESS DRAINAGE     AV FISTULA PLACEMENT Left 08/21/2021   Procedure: LEFT ARM ARTERIOVENOUS (AV) FISTULA.;  Surgeon: Nada Libman, MD;  Location: [facility] OR;  Service: Vascular;  Laterality: Left;   CARDIAC CATHETERIZATION     CARDIOVERSION N/A 10/09/2021   Procedure: CARDIOVERSION;  Surgeon: Laurey Morale, MD;  Location: [facility] ENDOSCOPY;  Service: Cardiovascular;  Laterality: N/A;   CARDIOVERSION N/A 05/28/2022   Procedure: CARDIOVERSION;  Surgeon: Laurey Morale, MD;  Location: [facility] ENDOSCOPY;  Service: Cardiovascular;  Laterality: N/A;   CARDIOVERSION N/A 06/07/2022   Procedure: CARDIOVERSION;  Surgeon: Laurey Morale, MD;  Location: [facility] ENDOSCOPY;  Service: Cardiovascular;  Laterality: N/A;   IR FLUORO GUIDE CV LINE RIGHT  03/10/2021   IR FLUORO GUIDE CV LINE RIGHT  04/22/2021   IR FLUORO GUIDE CV LINE RIGHT  08/20/2021   IR FLUORO GUIDE CV LINE RIGHT  03/01/2023   IR FLUORO GUIDE CV LINE RIGHT  03/09/2023   IR FLUORO GUIDE CV LINE RIGHT  05/30/2023   IR PTA VENOUS EXCEPT DIALYSIS CIRCUIT  03/09/2023   IR REMOVAL TUN CV CATH W/O FL  05/26/2023   IR REMOVE CV FIBRIN SHEATH  03/09/2023   IR US GUIDE VASC ACCESS RIGHT  03/10/2021   IR US GUIDE VASC ACCESS RIGHT  04/22/2021  IR US GUIDE VASC ACCESS RIGHT  05/30/2023   RIGHT HEART CATH N/A 03/06/2021   Procedure: RIGHT HEART CATH;  Surgeon: Dolores Patty, MD;  Location: [facility] INVASIVE CV LAB;  Service: Cardiovascular;  Laterality: N/A;   RIGHT/LEFT HEART CATH AND CORONARY ANGIOGRAPHY N/A 03/04/2020   Procedure: RIGHT/LEFT HEART CATH AND CORONARY ANGIOGRAPHY;  Surgeon: Laurey Morale, MD;  Location: [facility] INVASIVE CV LAB;  Service: Cardiovascular;  Laterality: N/A;   TEE  WITHOUT CARDIOVERSION N/A 05/05/2021   Procedure: TRANSESOPHAGEAL ECHOCARDIOGRAM (TEE);  Surgeon: Laurey Morale, MD;  Location: [facility] ENDOSCOPY;  Service: Cardiovascular;  Laterality: N/A;   TEMPORARY DIALYSIS CATHETER  03/06/2021   Procedure: TEMPORARY DIALYSIS CATHETER;  Surgeon: Dolores Patty, MD;  Location: [facility] INVASIVE CV LAB;  Service: Cardiovascular;;   Family History  Problem Relation Age of Onset   Heart disease Mother    Hypertension Mother    Pulmonary Hypertension Mother    Drug abuse Father        died due to Heroin overdose   Social History:  reports that he quit smoking about 6 years ago. His smoking use included cigarettes. He has been exposed to tobacco smoke. He has quit using smokeless tobacco. He reports that he does not drink alcohol and does not use drugs. 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, SRA positive   Metoprolol Other (See Comments)    near syncope   Amoxicillin Other (See Comments)    Was hospitalized    Other Swelling and Other (See Comments)    Steroids Fluid seeping out of legs    Prior to Admission medications   Medication Sig Start Date End Date Taking? Authorizing Provider  acetaminophen (TYLENOL) 500 MG tablet Take 2 tablets (1,000 mg total) by mouth every 6 (six) hours. 09/06/23  Yes Champ Mungo, DO  apixaban (ELIQUIS) 5 MG TABS tablet Take 1 tablet (5 mg total) by mouth 2 (two) times daily. 06/03/23  Yes [name], DO  Cholecalciferol (D3-1000) 25 MCG (1000 UT) tablet Take 500 Units by mouth daily.   Yes [provider]  cinacalcet (SENSIPAR) 30 MG tablet Take 1 tablet (30 mg total) by mouth every dialysis. Patient taking differently: Take 30 mg by mouth every Monday, Wednesday, and Friday. 09/12/23  Yes  Lovie Macadamia, MD  digoxin (LANOXIN) 0.125 MG tablet Take 1 tablet (0.125 mg total) by mouth 3 (three) times a week. Monday, Wednesday, Friday (dialysis days) 07/22/23  Yes Rocky Morel, DO  levothyroxine (SYNTHROID) 25 MCG tablet Take 1 tablet (25 mcg total) by mouth daily at 6 (six) AM. 07/21/23  Yes Rocky Morel, DO  midodrine (PROAMATINE) 10 MG tablet Take 3 tablets (30 mg total) by mouth 3 (three) times daily with meals. 06/20/23  Yes Setzer, Lynnell Jude, PA-C  naloxone [facility]) 2 MG/2ML injection Inject 2 mg into the skin as needed.   Yes [provider]  Oxycodone HCl 10 MG TABS Take 1 tablet (10 mg total) by mouth every 6 (six) hours as needed. Patient taking differently: Take 10 mg by mouth every 5 (five) hours as needed (Pain). 09/12/23  Yes Lovie Macadamia, MD  pregabalin (LYRICA) 50 MG capsule Take 1 capsule (50 mg total) by mouth daily. Take one capsule daily. 07/25/23  Yes Champ Mungo,  DO  sevelamer carbonate (RENVELA) 800 MG tablet Take 2 tablets (1,600 mg total) by mouth 3 (three) times daily with meals. 09/06/23  Yes Champ Mungo, DO  traMADol (ULTRAM) 50 MG tablet Take 50 mg by mouth every 6 (six) hours.   Yes [provider]  triamcinolone ointment (KENALOG) 0.1 % Apply topically 2 (two) times daily. 09/12/23  Yes Lovie Macadamia, MD  venlafaxine XR (EFFEXOR XR) 37.5 MG 24 hr capsule Take 1 capsule (37.5 mg total) by mouth daily. 07/07/23 07/06/24 Yes Juberg, Cristal Deer, DO  cholecalciferol (VITAMIN D3) 10 MCG (400 UNIT) TABS tablet Take 1 tablet (400 Units total) by mouth daily. Patient not taking: Reported on 09/22/2023 06/20/23   Milinda Antis, PA-C  Darbepoetin Alfa (ARANESP) 150 MCG/0.3ML SOSY injection To be given during dialysis by [facility] per Nephrology. Not to be given to patient at SNF. 09/06/23   Lovie Macadamia, MD  magnesium sulfate (EPSOM SALT) GRAN 30 g by Other route as needed (for toe wound). Patient not taking:  Reported on 09/22/2023 09/12/23   Lovie Macadamia, MD  neomycin-bacitracin-polymyxin (NEOSPORIN) OINT Apply 1 Application topically daily. Patient not taking: Reported on 09/22/2023 09/12/23   Lovie Macadamia, MD  tirzepatide [facility]) 2.5 MG/0.5ML injection vial Inject 2.5 mg into the skin once a week. Patient not taking: Reported on 09/22/2023 08/25/23   Lovie Macadamia, MD   Current Facility-Administered Medications  Medication Dose Route Frequency Provider Last Rate Last Admin   sodium zirconium cyclosilicate (LOKELMA) packet 10 g  10 g Oral TID Alan Mulder, MD   10 g at 09/22/23 0107   Current Outpatient Medications  Medication Sig Dispense Refill   acetaminophen (TYLENOL) 500 MG tablet Take 2 tablets (1,000 mg total) by mouth every 6 (six) hours. 30 tablet 0   apixaban (ELIQUIS) 5 MG TABS tablet Take 1 tablet (5 mg total) by mouth 2 (two) times daily.     Cholecalciferol (D3-1000) 25 MCG (1000 UT) tablet Take 500 Units by mouth daily.     cinacalcet (SENSIPAR) 30 MG tablet Take 1 tablet (30 mg total) by mouth every dialysis. (Patient taking differently: Take 30 mg by mouth every Monday, Wednesday, and Friday.) 60 tablet 0   digoxin (LANOXIN) 0.125 MG tablet Take 1 tablet (0.125 mg total) by mouth 3 (three) times a week. Monday, Wednesday, Friday (dialysis days) 30 tablet 1   levothyroxine (SYNTHROID) 25 MCG tablet Take 1 tablet (25 mcg total) by mouth daily at 6 (six) AM. 90 tablet 3   midodrine (PROAMATINE) 10 MG tablet Take 3 tablets (30 mg total) by mouth 3 (three) times daily with meals. 270 tablet 0   naloxone (NARCAN) 2 MG/2ML injection Inject 2 mg into the skin as needed.     Oxycodone HCl 10 MG TABS Take 1 tablet (10 mg total) by mouth every 6 (six) hours as needed. (Patient taking differently: Take 10 mg by mouth every 5 (five) hours as needed (Pain).) 30 tablet 0   pregabalin (LYRICA) 50 MG capsule Take 1 capsule (50 mg total) by mouth daily. Take one capsule daily. 90  capsule 1   sevelamer carbonate (RENVELA) 800 MG tablet Take 2 tablets (1,600 mg total) by mouth 3 (three) times daily with meals. 180 tablet 0   traMADol (ULTRAM) 50 MG tablet Take 50 mg by mouth every 6 (six) hours.     triamcinolone ointment (KENALOG) 0.1 % Apply topically 2 (two) times daily.     venlafaxine XR (EFFEXOR XR) 37.5 MG 24  hr capsule Take 1 capsule (37.5 mg total) by mouth daily. 30 capsule 2   cholecalciferol (VITAMIN D3) 10 MCG (400 UNIT) TABS tablet Take 1 tablet (400 Units total) by mouth daily. (Patient not taking: Reported on 09/22/2023) 100 tablet 0   Darbepoetin Alfa (ARANESP) 150 MCG/0.3ML SOSY injection To be given during dialysis by [facility] per Nephrology. Not to be given to patient at SNF.     magnesium sulfate (EPSOM SALT) GRAN 30 g by Other route as needed (for toe wound). (Patient not taking: Reported on 09/22/2023)     neomycin-bacitracin-polymyxin (NEOSPORIN) OINT Apply 1 Application topically daily. (Patient not taking: Reported on 09/22/2023)     tirzepatide (ZEPBOUND) 2.5 MG/0.5ML injection vial Inject 2.5 mg into the skin once a week. (Patient not taking: Reported on 09/22/2023) 2 mL 1   Labs: Basic Metabolic Panel: Recent Labs  Lab 09/21/23 2104 09/21/23 2215  NA 130* 127*  K 6.2* 6.0*  CL 86*  --   CO2 17*  --   GLUCOSE 102*  --   BUN 56*  --   CREATININE 9.88*  --   CALCIUM 9.6  --    Liver Function Tests: No results for input(s): "AST", "ALT", "ALKPHOS", "BILITOT", "PROT", "ALBUMIN" in the last 168 hours. No results for input(s): "LIPASE", "AMYLASE" in the last 168 hours. No results for input(s): "AMMONIA" in the last 168 hours. CBC: Recent Labs  Lab 09/21/23 2104 09/21/23 2215  WBC 10.8*  --   HGB 11.2* 12.6*  HCT 34.9* 37.0*  MCV 104.5*  --   PLT 254  --    Cardiac Enzymes: No results for input(s): "CKTOTAL", "CKMB", "CKMBINDEX", "TROPONINI" in the last 168 hours. CBG: No results for input(s): "GLUCAP" in the last 168  hours. Iron Studies: No results for input(s): "IRON", "TIBC", "TRANSFERRIN", "FERRITIN" in the last 72 hours. Studies/Results: DG Chest Portable 1 View Result Date: 09/21/2023 CLINICAL DATA:  Shortness of breath EXAM: PORTABLE CHEST 1 VIEW COMPARISON:  09/07/2023 FINDINGS: Dialysis catheter is again seen and stable. Cardiac shadow is enlarged but also stable. Lungs are well aerated bilaterally. No focal infiltrate or effusion is seen. No bony abnormality is noted. IMPRESSION: Stable cardiomegaly.  No acute abnormality is seen. Electronically Signed   By: Alcide Clever M.D.   On: 09/21/2023 21:30    ROS: As per HPI otherwise negative.  -  Physical Exam: Vitals:   09/22/23 0741 09/22/23 0800 09/22/23 0828 09/22/23 0925  BP: 122/67 127/77  124/82  Pulse:  88  86  Resp:  (!) 25  19  Temp: (!) 97.5 F (36.4 C)  99 F (37.2 C)   TempSrc: Oral  Oral   SpO2:  100%  93%  Weight:      Height:         General: morbidly obese male in no acute distress. Head: Normocephalic, atraumatic, sclera non-icteric, mucus membranes are moist Neck: Supple. JVD difficult to assess D/T body habitus.  Lungs: Decreased in bases otherwise CTAB. Resp shallow, no WOB Heart: S1,S2 RRR No M/R/G Abdomen: Obese, NT, NABS. Lower extremities: 2+ pitting ankle edme 2+ BLE edema  Neuro: Sedated, unable to assess Psych:  Sedated, unable to assess Dialysis Access: TDC blood lines connected  Dialysis Orders: Center:[facility] MWF 4.5 hrs 180NRe 400/800 155.5 kg 2.0 K/2.0 Ca TDC - no Heparin HIT + Flush TDC ports with citrate - Mircera 200 mcg IV q 2 weeks (last dose 08/24/2023) - Sensipar 30 mg PO three times per  week - Venofer 100 mg IV X 10 ordered in January. Has gotten 4/10 doses Last Tsat 17% 09/15/2023  PATIENT DOES NOT ACCEPT BLOOD PRODUCTS.  NO HEPARIN  Assessment/Plan:  Toxic metabolic encephalopathy-W/U per primary. SCr 9.88 BUN 56 02/26 prior to HD. Unable to awaken pt to assess for uremia.   Hyperkalemia-missed HD 09/21/2023. Had HD over night. Repeat K+ 5.1 2 hours post HD.   ESRD -  MWF. Next HD 09/23/2023  Hypertension/volume  - Generalized anascarca present on exam. CXR unremarkable. UF as tolerated. Net UF with HD 4 L. Na low.   Anemia  - HGB 11.2. No recent ESA. Hold Fe load for now.   Metabolic bone disease -  Resume VDRA, sensipar and binders when able to eat.   Nutrition - NPO  Rita H. Manson Passey, NP-C 09/22/2023, 11:12 AM  [employer] [phone]   Seen and examined independently.  Agree with note and exam as documented above by physician extender and as noted here.  Patient with a history with a history of end-stage renal disease on hemodialysis Monday Wednesday Friday at [facility], frequent noncompliance, and nonischemic cardiomyopathy who presented to the ER after he was found minimally responsive at his SNF per charting.  He received Narcan.  He states that he had a new pain medication that was added recently and he thinks that this was what made him confused.  He is more alert now and feels better after dialysis.  He states that he does wear oxygen usually.  He is a variable historian.  He tells me that since he has not been driving he has actually been coming to dialysis more frequently.  Had dialysis here overnight  general adult  in bed in no acute distress HEENT normocephalic atraumatic extraocular movements intact sclera anicteric Neck supple trachea midline Lungs clear to auscultation bilaterally normal work of breathing at rest and increased with exertion; on 4 liters oxygen Heart S1S2 no rub Abdomen soft nontender nondistended Extremities no edema  Psych normal mood and affect Access - RIJ tunn catheter  Metabolic encephalopathy May be secondary to missed dialysis however also note patient's report of pain medications being increased  # End-stage renal disease on hemodialysis HD per Monday Wednesday Friday  schedule Next treatment tomorrow  # Hyperkalemia Status post hemodialysis  # Hypertension Optimize volume status with HD  # Anemia of CKD - no current indication for ESA; Hb acceptable   # Metabolic bone disease - resume activated vit D and sensipar and binders when taking po  Thank you for the consult.  Please do not hesitate to contact me with any questions regarding our patient.   Vallery Sa, MD 3:01 PM 09/22/2023

## 2023-09-22 NOTE — ED Notes (Signed)
 Pt too lethargic to be given PO meds at this time, he would wake to speak his name/DOB but would not stay awake safely enough for the [facility] at this time. Will give PO meds once more alert.

## 2023-09-22 NOTE — Hospital Course (Addendum)
 Phillip Young is a 38 y.o. male with multiple significant chronic conditions including ESRD with HD, HFrEF, atrial fibrillation on Eliquis and digoxin, chronic respiratory failure 2/2 amiodarone toxicity, BMI 49, T2DM, and polyneuropathy returning to [facility] from SNF d/t toxic metabolic encephalopathy.    In brief - per patient - at SNF he was given tramadol and oxycodone and became extremely somnolent. They were then given Narcan in the setting of presume OD. Following Narcan it appears patient went into acute withdrawal and was very agitated and was having uncontrollable movements. He was brought to [facility] where he was given Ativan and Geodon - This was distressing to the patient. He went to HD and quickly returned to baseline mentation.   His hospital course was complicated by insurance issues.  From what I understand, the patient was accidentally enrolled in an inappropriate insurance plan.  Social work has been working diligently in order to try and rectify this.  In the meantime, patient will have help from aides and family in order to get to dialysis.  We have also begun steps for trying to obtain a motorized wheelchair for this patient which I believe will be extremely helpful.  He will follow with [place] shortly in clinic with his PCP, Dr. Ninfa Meeker.  ESRD on HD Anion gap metabolic acidosis Anemia of CKD Patient underwent HD per nephrology while admitted. His BP remained stable throughout admission. His home midodrine, cinacalcet, and renvela were continued.    LLE coolness ABIs showed normal right ABI, TBI. Normal left ABI, reduced TBI.  - Consider outpatient vascular consultation  - Lipid panel w/ LDL in the 40's no indication for statin at this time.    Paroxysmal atrial fibrillation RVR at admission improved with volume management via HD. His home eliquis and digoxin were continued. Provided info for cardiology follow up.   Chronic hypoxic respiratory failure Hx presumed amiodarone toxicity HFrEF  (LV EF <20%) Home digoxin and was continued. He was maintained on supplemental O2.    Chronic somatic and neuropathic pain Chronic opiate therapy Managed with tylenol, venlafaxine, lyrica, and oxycodone.   Hypothyroidism Continued home synthroid.   Depression Continued home venlafaxine.  R great toe wound, Toenail avulsion with podiatry last admission, provided info for podiatry follow up

## 2023-09-22 NOTE — Progress Notes (Signed)
 MB visited ED, spoke to Nurse and Dr.. EEG is NOT urgent, Dr. Sloan Leiter will speak with Attending if EEG is still needed, may cancel EEG all together. Pt still in hallway and waiting for a room for more urgent medical care. EEG will follow up as schedule permits.

## 2023-09-22 NOTE — Progress Notes (Signed)
 Patient not available for EEG due to being at dialysis. EEG tech will check back as schedule allows

## 2023-09-22 NOTE — Progress Notes (Signed)
 Pt normally receives out-pt HD at [facility] on MWF. Will contact clinic tomorrow to confirm arrangements were made with clinic for pt to resume care when pt d/c from rehab. Will assist as needed.   Olivia Canter Renal Navigator [phone]

## 2023-09-22 NOTE — Progress Notes (Signed)
 IMTS team received page about this [facility] patient being admitted to hospitalist service overnight.   IMTS [place] service will assume care starting today, 09/22/2023 7 AM. Attending is Dr. Antony Contras.  Morene Crocker, MD [facility] Internal Medicine Program - PGY-2 09/22/2023, 7:21 AM Pager# [phone]   Please contact the on call pager after 5 pm and on weekends at [phone].

## 2023-09-22 NOTE — H&P (Signed)
 History and Physical    Phillip Young WUX:[id number] DOB: 02-26-1986 DOA: 09/21/2023  PCP: [name], DO   Chief Complaint:  ams  HPI: Phillip Young is a 38 y.o. male with medical history significant of chronic respiratory failure, CHF, hypertension, nonischemic cardiomyopathy who presented to emergency due to altered mental status.  Patient was just discharged from the hospital on 2/17.  During his hospitalization he was treated for A-fib with RVR and underwent hemodialysis.  He was in his nursing facility when he developed somnolence and difficulty to arouse.  He was given Narcan and developed shortness of breath and pain all over.  He was brought to the ER for further assessment.  Rival he was afebrile and hemodynamically stable.  He had continued involuntary movements.  Labs were obtained which showed WBC 10.8, hemoglobin 11.2, potassium 6.2, sodium 130, bicarb 17, creatinine 9.8, BNP 909, pH 7.22.  Nephrology was consulted with plans for dialysis.  Patient was started on [facility] for better control of potassium.  On evaluation patient's family was at bedside and expressing distress over patient's current condition.   Review of Systems: Review of Systems  Constitutional: Negative.  Negative for chills and fever.  HENT: Negative.    Eyes: Negative.   Respiratory: Negative.    Cardiovascular: Negative.   Gastrointestinal: Negative.   Genitourinary: Negative.   Musculoskeletal: Negative.   Skin: Negative.   Neurological: Negative.   Psychiatric/Behavioral: Negative.       As per HPI otherwise 10 point review of systems negative.   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, SRA positive   Metoprolol Other (See Comments)    near syncope   Amoxicillin Other (See  Comments)    Was hospitalized    Other Swelling and Other (See Comments)    Steroids Fluid seeping out of legs     Past Medical History:  Diagnosis Date   Acute on chronic respiratory failure with hypoxia ([name]) 04/21/2021   Acute on chronic systolic (congestive) heart failure ([name]) 02/26/2020   Amiodarone toxicity    Anemia    Atrial flutter ([name])    Biventricular congestive heart failure ([name])    Chronic hypoxemic respiratory failure ([name])    Class 3 severe obesity due to excess calories with serious comorbidity and body mass index (BMI) of 50.0 to 59.9 in adult ([name]) 02/26/2020   Essential hypertension 02/26/2020   GERD without esophagitis 02/26/2020   Hidradenitis suppurativa 02/26/2020   NICM (nonischemic cardiomyopathy) ([name])    Obesity hypoventilation syndrome ([name])    OSA (obstructive sleep apnea)    PAF (paroxysmal atrial fibrillation) ([name])    Pneumonia    Prediabetes 02/26/2020    Past Surgical History:  Procedure Laterality Date   ABSCESS DRAINAGE     AV FISTULA PLACEMENT Left 08/21/2021   Procedure: LEFT ARM ARTERIOVENOUS (AV) FISTULA.;  Surgeon: Nada Libman, MD;  Location: [facility] OR;  Service: Vascular;  Laterality: Left;   CARDIAC CATHETERIZATION     CARDIOVERSION N/A 10/09/2021   Procedure: CARDIOVERSION;  Surgeon: Laurey Morale, MD;  Location: [facility] ENDOSCOPY;  Service: Cardiovascular;  Laterality: N/A;   CARDIOVERSION N/A 05/28/2022   Procedure: CARDIOVERSION;  Surgeon: Laurey Morale, MD;  Location: [facility] ENDOSCOPY;  Service: Cardiovascular;  Laterality: N/A;   CARDIOVERSION N/A  06/07/2022   Procedure: CARDIOVERSION;  Surgeon: Laurey Morale, MD;  Location: [facility] ENDOSCOPY;  Service: Cardiovascular;  Laterality: N/A;   IR FLUORO GUIDE CV LINE RIGHT  03/10/2021   IR FLUORO GUIDE CV LINE RIGHT  04/22/2021   IR FLUORO GUIDE CV LINE RIGHT  08/20/2021   IR FLUORO GUIDE CV LINE RIGHT  03/01/2023   IR FLUORO GUIDE CV LINE RIGHT  03/09/2023   IR FLUORO GUIDE CV LINE  RIGHT  05/30/2023   IR PTA VENOUS EXCEPT DIALYSIS CIRCUIT  03/09/2023   IR REMOVAL TUN CV CATH W/O FL  05/26/2023   IR REMOVE CV FIBRIN SHEATH  03/09/2023   IR US GUIDE VASC ACCESS RIGHT  03/10/2021   IR US GUIDE VASC ACCESS RIGHT  04/22/2021   IR US GUIDE VASC ACCESS RIGHT  05/30/2023   RIGHT HEART CATH N/A 03/06/2021   Procedure: RIGHT HEART CATH;  Surgeon: Dolores Patty, MD;  Location: [facility] INVASIVE CV LAB;  Service: Cardiovascular;  Laterality: N/A;   RIGHT/LEFT HEART CATH AND CORONARY ANGIOGRAPHY N/A 03/04/2020   Procedure: RIGHT/LEFT HEART CATH AND CORONARY ANGIOGRAPHY;  Surgeon: Laurey Morale, MD;  Location: [facility] INVASIVE CV LAB;  Service: Cardiovascular;  Laterality: N/A;   TEE WITHOUT CARDIOVERSION N/A 05/05/2021   Procedure: TRANSESOPHAGEAL ECHOCARDIOGRAM (TEE);  Surgeon: Laurey Morale, MD;  Location: [facility] ENDOSCOPY;  Service: Cardiovascular;  Laterality: N/A;   TEMPORARY DIALYSIS CATHETER  03/06/2021   Procedure: TEMPORARY DIALYSIS CATHETER;  Surgeon: Dolores Patty, MD;  Location: [facility] INVASIVE CV LAB;  Service: Cardiovascular;;     reports that he quit smoking about 6 years ago. His smoking use included cigarettes. He has been exposed to tobacco smoke. He has quit using smokeless tobacco. He reports that he does not drink alcohol and does not use drugs.  Family History  Problem Relation Age of Onset   Heart disease Mother    Hypertension Mother    Pulmonary Hypertension Mother    Drug abuse Father        died due to Heroin overdose    Prior to Admission medications   Medication Sig Start Date End Date Taking? Authorizing Provider  acetaminophen (TYLENOL) 500 MG tablet Take 2 tablets (1,000 mg total) by mouth every 6 (six) hours. 09/06/23   Champ Mungo, DO  apixaban (ELIQUIS) 5 MG TABS tablet Take 1 tablet (5 mg total) by mouth 2 (two) times daily. 06/03/23   [name], DO  cholecalciferol (VITAMIN D3) 10 MCG (400 UNIT) TABS tablet Take 1 tablet (400 Units  total) by mouth daily. 06/20/23   Setzer, Lynnell Jude, PA-C  cinacalcet (SENSIPAR) 30 MG tablet Take 1 tablet (30 mg total) by mouth every dialysis. 09/12/23   Lovie Macadamia, MD  Darbepoetin Alfa [place]) 150 MCG/0.3ML SOSY injection To be given during dialysis by [facility] per Nephrology. Not to be given to patient at SNF. 09/06/23   Lovie Macadamia, MD  digoxin (LANOXIN) 0.125 MG tablet Take 1 tablet (0.125 mg total) by mouth 3 (three) times a week. Monday, Wednesday, Friday (dialysis days) 07/22/23   Rocky Morel, DO  levothyroxine (SYNTHROID) 25 MCG tablet Take 1 tablet (25 mcg total) by mouth daily at 6 (six) AM. 07/21/23   Rocky Morel, DO  magnesium sulfate (EPSOM SALT) GRAN 30 g by Other route as needed (for toe wound). 09/12/23   Lovie Macadamia, MD  midodrine (PROAMATINE) 10 MG tablet Take 3 tablets (30 mg total) by mouth 3 (three) times daily with meals.  06/20/23   Setzer, Lynnell Jude, PA-C  neomycin-bacitracin-polymyxin (NEOSPORIN) OINT Apply 1 Application topically daily. 09/12/23   Lovie Macadamia, MD  Nystatin (GERHARDT'S BUTT CREAM) CREA Apply 1 Application topically 3 (three) times daily. Patient not taking: Reported on 09/07/2023 05/24/23   [name], DO  Oxycodone HCl 10 MG TABS Take 1 tablet (10 mg total) by mouth every 6 (six) hours as needed. 09/12/23   Lovie Macadamia, MD  pregabalin (LYRICA) 50 MG capsule Take 1 capsule (50 mg total) by mouth daily. Take one capsule daily. 07/25/23   Champ Mungo, DO  sevelamer carbonate (RENVELA) 800 MG tablet Take 2 tablets (1,600 mg total) by mouth 3 (three) times daily with meals. 09/06/23   Champ Mungo, DO  tirzepatide (ZEPBOUND) 2.5 MG/0.5ML injection vial Inject 2.5 mg into the skin once a week. 08/25/23   Lovie Macadamia, MD  triamcinolone ointment (KENALOG) 0.1 % Apply topically 2 (two) times daily. 09/12/23   Lovie Macadamia, MD  venlafaxine XR (EFFEXOR XR) 37.5 MG 24 hr capsule Take 1 capsule  (37.5 mg total) by mouth daily. 07/07/23 07/06/24  [name], DO    Physical Exam: Vitals:   09/21/23 2117 09/21/23 2245 09/21/23 2350 09/22/23 0105  BP:   111/89 (!) 117/104  Pulse:  (!) 104 (!) 102   Resp:  17 20 (!) 29  Temp:   98.2 F (36.8 C)   TempSrc:   Oral   SpO2:  100% 100%   Weight: (!) 161 kg     Height: 6' (1.829 m)      Physical Exam Constitutional:      Appearance: He is obese.  HENT:     Head: Normocephalic.  Eyes:     Conjunctiva/sclera: Conjunctivae normal.     Pupils: Pupils are equal, round, and reactive to light.  Cardiovascular:     Rate and Rhythm: Tachycardia present.     Pulses: Normal pulses.  Pulmonary:     Effort: Pulmonary effort is normal.     Breath sounds: Normal breath sounds.  Abdominal:     General: Abdomen is flat. Bowel sounds are normal.  Musculoskeletal:        General: Normal range of motion.  Skin:    General: Skin is warm.     Capillary Refill: Capillary refill takes less than 2 seconds.  Neurological:     Mental Status: Mental status is at baseline. He is disoriented.  Psychiatric:        Mood and Affect: Mood normal.        Labs on Admission: I have personally reviewed the patients's labs and imaging studies.  Assessment/Plan Principal Problem:   Hyperkalemia   # Acute toxic/metabolic encephalopathy, POA, active # ESRD on hemodialysis - Patient has potassium greater than 6 - Received Narcan for somnolence while on chronic narcotics  Plan: Avoid sedating medications Start [facility] Nephrology consulted with plans for dialysis tonight Obtain spot eeg  # Paroxysmal A-fib-continue Eliquis, digoxin.  Will check digoxin level  # Hypothyroidism-continue levothyroxine  # Hypotension-continue midodrine  # Chronic pain-continue home oxycodone, Lyrica  # Type 2 diabetes-patient is not on insulin at home but takes Zepbound  # Depression-continue venlafaxine  # Hyponatremia-likely related to volume  status.  Nephrology consulted  # Lactic acidosis-patient has poor renal clearance.  Will plan to trend and perform spot EEG to rule out seizure  # Chronic hypoxic respiratory failure secondary to amiodarone-patient is on nasal cannula and intermittently requiring BiPAP  # Chronic heart failure reduced  ejection fraction, not in exacerbation-continue to monitor   Admission status: Inpatient Progressive  Certification: The appropriate patient status for this patient is INPATIENT. Inpatient status is judged to be reasonable and necessary in order to provide the required intensity of service to ensure the patient's safety. The patient's presenting symptoms, physical exam findings, and initial radiographic and laboratory data in the context of their chronic comorbidities is felt to place them at high risk for further clinical deterioration. Furthermore, it is not anticipated that the patient will be medically stable for discharge from the hospital within 2 midnights of admission.   * I certify that at the point of admission it is my clinical judgment that the patient will require inpatient hospital care spanning beyond 2 midnights from the point of admission due to high intensity of service, high risk for further deterioration and high frequency of surveillance required.Alan Mulder MD [facility] If 7PM-7AM, please contact night-coverage [email or web address]  09/22/2023, 2:06 AM

## 2023-09-22 NOTE — Progress Notes (Signed)
 HD#1 Subjective:  Patient Summary: Phillip Young is a 38 y.o. with a pertinent PMH of HFrEF (EF). Chronic hypoxic respiratory failure, hypertension, nonischemic cardiomyopathy, who presented with AMS and admitted on 2/26 for acute encephalopathy on HD#1.   Overnight events: Came from facility short of breath with jerky movements. Given narcan at facility. [facility]. Started on lokelma then taken to HD for HD with K 6.5 and acidosis with elevated lactate. Transitioned off Bipap, remained on 5L [place] 4L out with HD  Patient unable to interact on exam after HD, seen in hallway of ED. I called and talked with facility. He missed several dialysis sessions last week and was being [facility] yesterday but said he was not going to dialysis. Physician at [facility] states that he was not encephalopathic when he left their facility.  I called and talked with his mom. It sounds like when they tried to pick him up from the facility, he was not responding to them and was asleep. After Narcan was given, Phillip Young was in a lot of pain, moaning in pain and moving around a lot. She had them call EMS.   Pt is updated on the plan for today, and all questions and concerns are addressed.   Objective:  Vital signs in last 24 hours: Vitals:   09/22/23 0500 09/22/23 0530 09/22/23 0600 09/22/23 0630  BP: 133/84 132/80 (!) 126/92 138/76  Pulse: 96 95 92 90  Resp: (!) 22 (!) 21 (!) 21 16  Temp:      TempSrc:      SpO2: 100% 100% 100% 100%  Weight:      Height:       Supplemental O2: Nasal Cannula SpO2: 100 % O2 Flow Rate (L/min): 5 L/min FiO2 (%): 40 %   Physical Exam:  Constitutional: somnolent, opens eyes to touch HENT: no evidence of tongue biting Cardiovascular: regular rate and rhythm, no m/r/g, TDC in place, no surrounding erythema Pulmonary/Chest: normal work of breathing on 5L [place], anterior auscultation clear Abdominal: soft, non-tender, non-distended MSK: 2+  pitting edema to bilateral lower extremity, right foot is cold, left foot warm  Neurological: somnolent, pupils are equal and reactive, responds to pain  Filed Weights   09/21/23 2117  Weight: (!) 161 kg    No intake or output data in the 24 hours ending 09/22/23 0720 Net IO Since Admission: No IO data has been entered for this period [09/22/23 0720]  Pertinent Labs:    Latest Ref Rng & Units 09/21/2023   10:15 PM 09/21/2023    9:04 PM 09/12/2023    1:34 PM  CBC  WBC 4.0 - 10.5 K/uL  10.8  10.2   Hemoglobin 13.0 - 17.0 g/dL 78.2  95.6  21.3   Hematocrit 39.0 - 52.0 % 37.0  34.9  31.2   Platelets 150 - 400 K/uL  254  242        Latest Ref Rng & Units 09/21/2023   10:15 PM 09/21/2023    9:04 PM 09/12/2023    4:06 AM  CMP  Glucose 70 - 99 mg/dL  086  92   BUN 6 - 20 mg/dL  56  78   Creatinine 5.78 - 1.24 mg/dL  4.69  6.29   Sodium 528 - 145 mmol/L 127  130  134   Potassium 3.5 - 5.1 mmol/L 6.0  6.2  5.5   Chloride 98 - 111 mmol/L  86  97   CO2 22 -  32 mmol/L  17  19   Calcium 8.9 - 10.3 mg/dL  9.6  9.7    VBG pH 7.2, Bicarb 19  Lactic acid 8.5  Imaging: DG Chest Portable 1 View Result Date: 09/21/2023 CLINICAL DATA:  Shortness of breath EXAM: PORTABLE CHEST 1 VIEW COMPARISON:  09/07/2023 FINDINGS: Dialysis catheter is again seen and stable. Cardiac shadow is enlarged but also stable. Lungs are well aerated bilaterally. No focal infiltrate or effusion is seen. No bony abnormality is noted. IMPRESSION: Stable cardiomegaly.  No acute abnormality is seen. Electronically Signed   By: Alcide Clever M.D.   On: 09/21/2023 21:30    Assessment/Plan:   Principal Problem:   Hyperkalemia   Patient Summary: Phillip Young is a 38 y.o. with a pertinent PMH of ESRD on MWF, chronic respiratory failure in setting of amiodarone toxicity with baseline of 2L O2, HFrEF (EF 30-35%), who presented with acute encephalopathy and admitted on 2/26 for acute metabolic/toxic encephalopathy on HD#1.    Acute encephalopathy Anion gap metabolic acidosis His story is concerning for medication overdose. It sounds like after narcan he had shaking of limbs but was still interacting and was moaning in pain. Mother denies any falls or trauma. In the ED, he was given ativan and geodon overnight. With elevated lactic >8 on admission there was concern for if he had a seizure. His history is less concerning for seizure with him continuing to be interactive in transport to hospital. Mother does not endorse symptoms concerning for infection with Phillip Young in the last few days. WBC at 10.8. Seems unlikely this is from infection. I do not think BUN was high enough to explain his altered mental status at facility.  Leading suspicion is that he received to much medication at facility and missed HD contributing to AMS. When narcaned he went into withdrawal with pain and agitation. Since then received ativan and geodon.  Mentation has improved after HD, seems back at baseline. Lactic acid now at 2.9 with K 5.1.  -repeat VBG -repeat RFP -PT/OT, was being discharged from [facility] 2/26 -tramadol added to allergy list  ESRD on HD Hyperkalemia Hypotension He skipped HD last week but had 2 make up sessions. He had HD last on Monday then declined going 2/26. Initial labs with K of 6.2, lactic acid of 8. BUN minimally elevated at  56. home meds: midodrine 10 mg TID -restart midodrine  Chronic hypoxic respiratory failure History of amiodarone toxicity Uses 2L [place] at home. He was placed on Bipap last night but is on 2L since getting back from HD. -Keep O2 sats >92%  Chronic HFrEF EF 30-35%,  hyponatremia BNP is down from baseline at 909. Volume management with HD.  Paroxysmal atrial fibrillation Currently rate controlled with HR in 80s. -telemetry -continue eliquis 5 mg bid -continue digoxin 0.125 mg MWF  Hypothyroidism Last TSH at 4.9. -continue synthyroid  Depression Home med with effexor.     Diet: renal IVF: None,None VTE: eliquis Code: Full PT/OT recs: Pending. TOC recs: pending Family Update: mother updated by phone   Dispo: Anticipated discharge to  home  in tomorrow pending PT/OT and if additional HD needed.   Phillip Young M. Phillip Young, D.O.  Internal Medicine Resident, PGY-3 [facility]  Pager: [phone] 7:20 AM, 09/22/2023   **Please contact the on call pager after 5 pm and on weekends at [phone].**

## 2023-09-23 DIAGNOSIS — E875 Hyperkalemia: Secondary | ICD-10-CM | POA: Diagnosis not present

## 2023-09-23 LAB — RENAL FUNCTION PANEL
Albumin: 3.2 g/dL — ABNORMAL LOW (ref 3.5–5.0)
Anion gap: 16 — ABNORMAL HIGH (ref 5–15)
BUN: 48 mg/dL — ABNORMAL HIGH (ref 6–20)
CO2: 24 mmol/L (ref 22–32)
Calcium: 8.6 mg/dL — ABNORMAL LOW (ref 8.9–10.3)
Chloride: 93 mmol/L — ABNORMAL LOW (ref 98–111)
Creatinine, Ser: 8.11 mg/dL — ABNORMAL HIGH (ref 0.61–1.24)
GFR, Estimated: 8 mL/min — ABNORMAL LOW (ref >60)
Glucose, Bld: 109 mg/dL — ABNORMAL HIGH (ref 70–99)
Phosphorus: 7.1 mg/dL — ABNORMAL HIGH (ref 2.5–4.6)
Potassium: 3.9 mmol/L (ref 3.5–5.1)
Sodium: 133 mmol/L — ABNORMAL LOW (ref 135–145)

## 2023-09-23 LAB — CBC
HCT: 28.7 % — ABNORMAL LOW (ref 39.0–52.0)
Hemoglobin: 9.9 g/dL — ABNORMAL LOW (ref 13.0–17.0)
MCH: 34.7 pg — ABNORMAL HIGH (ref 26.0–34.0)
MCHC: 34.5 g/dL (ref 30.0–36.0)
MCV: 100.7 fL — ABNORMAL HIGH (ref 80.0–100.0)
Platelets: 184 10*3/uL (ref 150–400)
RBC: 2.85 MIL/uL — ABNORMAL LOW (ref 4.22–5.81)
RDW: 17.4 % — ABNORMAL HIGH (ref 11.5–15.5)
WBC: 6.9 10*3/uL (ref 4.0–10.5)
nRBC: 0.4 % — ABNORMAL HIGH (ref 0.0–0.2)

## 2023-09-23 LAB — LACTIC ACID, PLASMA
Lactic Acid, Venous: 2.7 mmol/L (ref 0.5–1.9)
Lactic Acid, Venous: 2.1 mmol/L (ref 0.5–1.9)
Lactic Acid, Venous: 2.3 mmol/L (ref 0.5–1.9)

## 2023-09-23 LAB — GLUCOSE, CAPILLARY: Glucose-Capillary: 128 mg/dL — ABNORMAL HIGH (ref 70–99)

## 2023-09-23 MED ORDER — VENLAFAXINE HCL ER 37.5 MG PO CP24
37.5000 mg | ORAL_CAPSULE | Freq: Every day | ORAL | Status: DC
  Administered 2023-09-23 – 2023-09-29 (×7): 37.5 mg via ORAL
  Filled 2023-09-23 (×7): qty 1

## 2023-09-23 MED ORDER — MIDODRINE HCL 5 MG PO TABS: 30.0000 mg | ORAL_TABLET | Freq: Three times a day (TID) | ORAL | Status: DC

## 2023-09-23 MED ORDER — PREGABALIN 25 MG PO CAPS
50.0000 mg | ORAL_CAPSULE | Freq: Every day | ORAL | Status: DC
Start: 2023-09-23 — End: 2023-09-23

## 2023-09-23 MED ORDER — APIXABAN 5 MG PO TABS: 5.0000 mg | ORAL_TABLET | Freq: Two times a day (BID) | ORAL | Status: DC

## 2023-09-23 MED ORDER — PENTAFLUOROPROP-TETRAFLUOROETH EX AERO: 1.0000 | INHALATION_SPRAY | CUTANEOUS | Status: DC | PRN

## 2023-09-23 MED ORDER — LEVOTHYROXINE SODIUM 25 MCG PO TABS: 25.0000 ug | ORAL_TABLET | Freq: Every day | ORAL | Status: DC

## 2023-09-23 MED ORDER — SEVELAMER CARBONATE 800 MG PO TABS
1600.0000 mg | ORAL_TABLET | Freq: Three times a day (TID) | ORAL | Status: DC
Start: 2023-09-23 — End: 2023-09-29
  Administered 2023-09-23 – 2023-09-29 (×13): 1600 mg via ORAL
  Filled 2023-09-23 (×14): qty 2

## 2023-09-23 MED ORDER — LIDOCAINE HCL (PF) 1 % IJ SOLN: 5.0000 mL | INTRAMUSCULAR | Status: DC | PRN

## 2023-09-23 MED ORDER — ALTEPLASE 2 MG IJ SOLR: 2.0000 mg | Freq: Once | INTRAMUSCULAR | Status: DC | PRN

## 2023-09-23 MED ORDER — ANTICOAGULANT SODIUM CITRATE 4% (200MG/5ML) IV SOLN
5.0000 mL | Status: DC | PRN
  Filled 2023-09-23: qty 5

## 2023-09-23 MED ORDER — LIDOCAINE-PRILOCAINE 2.5-2.5 % EX CREA: 1.0000 | TOPICAL_CREAM | CUTANEOUS | Status: DC | PRN

## 2023-09-23 MED ORDER — DIGOXIN 125 MCG PO TABS: 0.1250 mg | ORAL_TABLET | ORAL | Status: DC

## 2023-09-23 MED ORDER — OXYCODONE HCL 10 MG PO TABS: 10.0000 mg | ORAL_TABLET | Freq: Four times a day (QID) | ORAL | Status: DC | PRN

## 2023-09-23 NOTE — Progress Notes (Signed)
 MEWS Progress Note  Patient Details Name: Tymeir Weathington MRN: [id number] DOB: 10-19-1985 Today's Date: 09/23/2023   MEWS Flowsheet Documentation:  Assess: MEWS Score Temp: 98.4 F (36.9 C) BP: 109/68 MAP (mmHg): 83 Pulse Rate: (!) 138 ECG Heart Rate: 89 Resp: 18 Level of Consciousness: Alert SpO2: 95 % O2 Device: Nasal Cannula Patient Activity (if Appropriate): In bed O2 Flow Rate (L/min): 6 L/min FiO2 (%): 40 % Assess: MEWS Score MEWS Temp: 0 MEWS Systolic: 0 MEWS Pulse: 3 MEWS RR: 0 MEWS LOC: 0 MEWS Score: 3 MEWS Score Color: Yellow Assess: SIRS CRITERIA SIRS Temperature : 0 SIRS Respirations : 0 SIRS Pulse: 1 SIRS WBC: 0 SIRS Score Sum : 1 Assess: if the MEWS score is Yellow or Red Were vital signs accurate and taken at a resting state?: Yes Does the patient meet 2 or more of the SIRS criteria?: No MEWS guidelines implemented : No, previously yellow, continue vital signs every 4 hours        Denton Meek 09/23/2023, 7:29 AM

## 2023-09-23 NOTE — Progress Notes (Addendum)
 HD#2 Subjective:   Summary: Phillip Young is a 38 y.o. male with multiple significant chronic conditions including ESRD with HD, HFrEF, atrial fibrillation on Eliquis and digoxin, chronic respiratory failure 2/2 amiodarone toxicity, BMI 49, T2DM, and polyneuropathy returning to [facility] from SNF d/t toxic metabolic encephalopathy.   In brief - per patient - at SNF prior to d/c he was given tramadol and oxycodone and became extremely somnolent. They were given Narcan in the setting of presume OD. Following Narcan it appears patient went into acute withdrawal and was very agitated and was having uncontrollable movements. He was brought to [facility] where he was given ativan and Geodon - This was distressing to the patient. He went o HD yesterday, and seems to be back at baseline today.   Overnight Events: None  Patient seen today - described above history to [place]. Feeling well otherwise - near baseline. Ongoing issues w/ drop foot, neuropathy, etc. Well know to this service. He was in CIR prior and had great improvement with this. PT /OT suggesting CIR - I believe this is a good option as well. Some issues with A-fib this admission. Cardiology on board last admission, suggested continuing dig, optimizing volume. No role for ablation at this time. Hx of presume severe rxn to amiodarone in the past. Blood pressures liable. Above EDW per nephrology. Appreciate everyone help.   Objective:  Vital signs in last 24 hours: Vitals:   09/23/23 1016 09/23/23 1035 09/23/23 1040 09/23/23 1100  BP: (!) 108/91  113/85 (!) 114/90  Pulse: (!) 134 64 (!) 135 (!) 119  Resp: 17 (!) 27 (!) 27 (!) 27  Temp: 98.3 F (36.8 C) 97.8 F (36.6 C)    TempSrc: Oral     SpO2: 98% 99% 100% 96%  Weight: (!) 167 kg     Height:       Supplemental O2: Nasal Cannula SpO2: 96 % O2 Flow Rate (L/min): 4 L/min FiO2 (%): 40 %   Physical Exam:  Constitutional: Alert, NAD Cardiovascular: regular rate and rhythm, no m/r/g, TDC in place,  no surrounding erythema, no TTP Pulmonary/Chest: normal work of breathing on 4L [place], anterior auscultation clear MSK: bilateral lower extremity edema. Bandage applied to the 1st toe.   Filed Weights   09/21/23 2117 09/23/23 1016  Weight: (!) 161 kg (!) 167 kg      Intake/Output Summary (Last 24 hours) at 09/23/2023 1131 Last data filed at 09/23/2023 1039 Gross per 24 hour  Intake 480 ml  Output 0 ml  Net 480 ml   Net IO Since Admission: -3,520 mL [09/23/23 1131]  Pertinent Labs:    Latest Ref Rng & Units 09/23/2023    5:11 AM 09/22/2023    3:49 PM 09/21/2023   10:15 PM  CBC  WBC 4.0 - 10.5 K/uL 6.9  7.3    Hemoglobin 13.0 - 17.0 g/dL 9.9  9.1  16.1   Hematocrit 39.0 - 52.0 % 28.7  27.5  37.0   Platelets 150 - 400 K/uL 184  186         Latest Ref Rng & Units 09/23/2023    5:11 AM 09/22/2023   10:31 AM 09/21/2023   10:15 PM  CMP  Glucose 70 - 99 mg/dL 096  62    BUN 6 - 20 mg/dL 48  31    Creatinine 0.45 - 1.24 mg/dL 4.09  8.11    Sodium 914 - 145 mmol/L 133  132  127   Potassium 3.5 -  5.1 mmol/L 3.9  5.1  6.0   Chloride 98 - 111 mmol/L 93  91    CO2 22 - 32 mmol/L 24  25    Calcium 8.9 - 10.3 mg/dL 8.6  9.0      Assessment/Plan:   Principal Problem:   Hyperkalemia Active Problems:   Acute encephalopathy  ESRD on HD Hyperkalemia Hypotension Anemia of CKD Seen by nephrology and appreciate their help. Skipped some HD session in recent past. Has had trouble in past with liable pressures. Per Nephro - above EDW. Likely contributing to his a-fib. Will get repeat dialysis today.  - Appreciate nephrology's management - Continue midodrine - Continue Cinacalcet 30 mg non-dialysis days - Continue Renvela 1600 mg 3 times daily with meals - Daily CBC, RFP  Paroxysmal atrial fibrillation In and out of a-fib this admission. Rates 130's at times.   -Telemetry -continue eliquis 5 mg bid -continue digoxin 0.125 mg MWF  Lactic Acidosis Improved though slightly elevated  since admission, will continue to trend.   AoCHRF HFrEF [EF <20%]  Last EF 30-35% in October 2024 with moderately decreased LV function and global hypokinesis. Echo this admission with challenging views, however EF estimated at <20% with global hypokinesis. GDMT limited due to hypotension. No crackles, endorses orthopnea - Continue with HD - Continue with Digoxin    Acute encephalopathy - Resolved Anion gap metabolic acidosis Much improved compared to presentation based on prior documentation - he appears to be close to baseline. AGMA persistent, felt 2-2 uremia. Last VBG WNL.    Chronic hypoxic respiratory failure History of presumed amiodarone toxicity Uses 2L [place] at home. He requires nightly BiPAP at home as well. - Keep O2 sats >92%  Chronic somatic and neuropathic pain Chronic opiate therapy - Tylenol PRN - Venlafaxine 37.5mg  daily  - Lyrica 50mg  daily  - Oxycodone 10mg  q6hrs PRN     Hypothyroidism Last TSH at 4.9. - Continue synthyroid   Depression - Home med with effexor.   Disposition Planning Functional deconditioning BMI 49 Will see if he is able to get SNF vs CIR vs D/c home. - Appreciate SW help - Continue to engage with PT  R great toe wound, c/f ingrown toenail Toenail avulsion with podiatry last admission - Wound care per orders  Diet: Renal IVF: None,None VTE: DOAC Code: Full PT/OT recs: CIR  Dispo: Anticipated discharge - location pending - in 2 days pending improvement of volume status, clinical stability.   Lovie Macadamia MD Internal Medicine Resident PGY-1 Pager: [phone] Please contact the on call pager after 5 pm and on weekends at [phone].

## 2023-09-23 NOTE — Progress Notes (Signed)
 Spoke to staff at [facility] who confirmed that clinic was aware of pt's d/c from rehab and pt was to resume at HD clinic upon return home. Will update clinic on pt's d/c date once known. Will assist as needed.   Olivia Canter Renal Navigator [phone]

## 2023-09-23 NOTE — Progress Notes (Incomplete)
   09/23/23 1455  Vitals  Temp 97.7 F (36.5 C)  Temp Source Oral  BP (!) 132/98  MAP (mmHg) 110  BP Location Right Wrist  BP Method Automatic  Patient Position (if appropriate) Lying  Pulse Rate (!) 50  Pulse Rate Source Monitor  ECG Heart Rate (!) 132  Resp (!) 25  Oxygen Therapy  SpO2 98 %  O2 Device Nasal Cannula  O2 Flow Rate (L/min) 4 L/min  During Treatment Monitoring  Blood Flow Rate (mL/min) 0 mL/min  Arterial Pressure (mmHg) 83.43 mmHg  Venous Pressure (mmHg) -33.73 mmHg  TMP (mmHg) 24.84 mmHg  Ultrafiltration Rate (mL/min) 1188 mL/min  Dialysate Flow Rate (mL/min) 300 ml/min  Dialysate Potassium Concentration 3  Dialysate Calcium Concentration 2.5  Duration of HD Treatment -hour(s) 4 hour(s)  Cumulative Fluid Removed (mL) per Treatment  3500.25  Post Treatment  Dialyzer Clearance Lightly streaked  Hemodialysis Intake (mL) 0 mL  Liters Processed 92.4  Fluid Removed (mL) 3500 mL  Tolerated HD Treatment Yes  Post-Hemodialysis Comments tolerated well tx completed  Hemodialysis Catheter Right Internal jugular Double lumen Permanent (Tunneled)  Placement Date/Time: 05/30/23 0856   Serial / Lot #: [id number]  Expiration Date: 09/23/27  Time Out: Correct patient;Correct site;Correct procedure  Maximum sterile barrier precautions: Hand hygiene;Cap;Mask;Sterile gown;Sterile gloves;Large sterile s...  Site Condition No complications  Blue Lumen Status Flushed;Dead end cap in place  Red Lumen Status Flushed;Dead end cap in place  Purple Lumen Status N/A  Catheter fill solution 4% Sodium Citrate  Catheter fill volume (Arterial) 1.9 cc  Catheter fill volume (Venous) 1.9  Dressing Type Transparent  Dressing Status Antimicrobial disc/dressing in place

## 2023-09-23 NOTE — Progress Notes (Signed)
   09/23/23 2037  Vitals  Temp 98.4 F (36.9 C)  BP (!) 128/101  MAP (mmHg) 110  BP Location Right Arm  BP Method Automatic  Patient Position (if appropriate) Sitting  Pulse Rate 70  Pulse Rate Source Monitor  ECG Heart Rate (!) 143  Resp 18  MEWS COLOR  MEWS Score Color Yellow  Oxygen Therapy  SpO2 100 %  O2 Device Nasal Cannula  MEWS Score  MEWS Temp 0  MEWS Systolic 0  MEWS Pulse 3  MEWS RR 0  MEWS LOC 0  MEWS Score 3

## 2023-09-23 NOTE — Consult Note (Signed)
 [facility] Liaison Consult Note   09/23/2023  Phillip Young Jan 17, 1986 [id number]  09/21/26 from ED to HD Late entry:  Patient was reviewed and in HD 09/22/23 however was showing as active with Managed Medicaid team. Working with [employer] leadership regarding insurance changes and which post hospital program is most appropriate.  Insurance: Medicare A& B; Medicaid  Primary Care Provider: [name], DO, [facility] Internal Medicine, this provider is listed for the transition of care follow up appointments  and [facility] calls   [facility] Liaison met patient at bedside at [facility]. Came to unit and patient just for another HD procedure today and not in room.    The patient was screened for 30 day readmission hospitalization with noted extreme risk score for unplanned readmission risk 6 hospital admissions in 6 months.  The patient was assessed for potential [facility] Coordination service needs for post hospital transition for care coordination. Review of patient's electronic medical record reveals patient was recently in [facility]'s inpatient rehab.  Patient had a CIR referral for inpatient rehab.  Plan: [facility] Liaison will continue to follow progress and disposition to asess for post hospital community care coordination/management needs.  Referral request for community care coordination: Pending disposition, continue to follow for community program needs   [facility], Population [facility] does not replace or interfere with any arrangements made by the Inpatient Transition of Care team.   For questions contact:   Charlesetta Shanks, RN, BSN, CCM [place]  [facility], [facility] Liaison Direct Dial: [phone] or secure chat Email: [email or web address]

## 2023-09-23 NOTE — Progress Notes (Signed)
 HD#2 SUBJECTIVE:  Patient Summary: Phillip Young is a 38 y.o. with multiple significant chronic conditions including ESRD with HD, HFrEF, atrial fibrillation on Eliquis and digoxin, chronic respiratory failure 2/2 amiodarone toxicity, BMI 49, T2DM, and polyneuropathy admitted for acute encephalopathy who is stable for discharge pending confirmed outpatient HD transportation.   Overnight Events: NAEON  Interim History: Phillip Young is feeling motivated to continue working with therapy and to push through his pain for ongoing recovery of strength and quality of life. He did pick at his toe nails and one of his toes began to bleed after the toe nail pulled off this morning. No other complaints or concerns.  OBJECTIVE:  Vital Signs: Vitals:   09/23/23 1455 09/23/23 1500 09/23/23 1537 09/23/23 1607  BP: (!) 132/98  130/89 125/78  Pulse: (!) 50  (!) 114   Resp: (!) 25     Temp: 97.7 F (36.5 C) 97.7 F (36.5 C)  98 F (36.7 C)  TempSrc: Oral   Oral  SpO2: 98%   97%  Weight:      Height:       Supplemental O2: Nasal Cannula SpO2: 97 % O2 Flow Rate (L/min): 4 L/min FiO2 (%): 40 %  Filed Weights   09/21/23 2117 09/23/23 1016  Weight: (!) 161 kg (!) 167 kg     Intake/Output Summary (Last 24 hours) at 09/23/2023 1642 Last data filed at 09/23/2023 1455 Gross per 24 hour  Intake 480 ml  Output 3500 ml  Net -3020 ml   Net IO Since Admission: -7,020 mL [09/23/23 1642]  Physical Exam: Constitutional:Sitting at edge of bed eating breakfast salad. In no acute distress. Cardio:Tachycardic. 2+ pitting edema to bilateral LE.  Pulm:Clear to auscultation bilaterally. Normal work of breathing on 4L [place]. Abdomen:Soft, nontender, nondistended. MSK/Skin:L foot is cooler than R, with increased coolness to the L great toe. DP pulses palpable. Bandage over several toes of the R foot. Neuro:Alert and oriented x3. No focal deficit noted. Psych:Pleasant mood and affect.   Patient  Lines/Drains/Airways Status     Active Line/Drains/Airways     Name Placement date Placement time Site Days   Peripheral IV 09/21/23 18 G Right Antecubital 09/21/23  2100  Antecubital  2   Peripheral IV 09/22/23 Anterior;Left Forearm 09/22/23  0010  Forearm  1   Hemodialysis Catheter Right Internal jugular Double lumen Permanent (Tunneled) 05/30/23  0856  Internal jugular  116   Wound / Incision (Open or Dehisced) 08/31/23 Other (Comment) Thigh Left;Posterior partial thickness blisters 08/31/23  --  Thigh  23   Wound / Incision (Open or Dehisced) 09/22/23 Non-pressure wound Toe (Comment  which one) Right R great toenail removed 09/22/23  1930  Toe (Comment  which one)  1             ASSESSMENT/PLAN:  Assessment: Principal Problem:   Hyperkalemia Active Problems:   Acute encephalopathy   Plan: ESRD on HD Anion gap metabolic acidosis Anemia of CKD Suspect he is above EDW per nephrology notes. Received HD 02/28 with 3.5L off. BP have been stable. Suspect volume overload is continuing to RVR. AGMA likely in setting of ESRD. Plan: -Nephrology consulted, appreciate their recommendations -Continue midodrine 30 mg TID -Continue Cinacalcet 30 mg daily on TThSa  -Continue Renvela 1600 mg 3 times daily with meals -Trend renal function   LLE coolness With some numbness, which is new. The proximal foot is cooler when compared to the R foot, but the great toe is much  cooler in comparison. He is not having any pain. Plan: -Will order ABIs; VVS consult if indicated by results  Paroxysmal atrial fibrillation Suspect RVR is related to volume overload, managing volume status with HD. Next HD session is set for 03/03. Plan: -Telemetry monitoring -Continue home eliquis 5 mg BID, digoxin 0.125 mg MWF   Lactic Acidosis Plan: -Trend lactic acid levels through normalization   Chronic hypoxic respiratory failure Hx presumed amiodarone toxicity HFrEF (LV EF <20%) Echo 08/2023 with  challenging views, however EF estimated at <20% with global hypokinesis. GDMT limited due to hypotension. No crackles, stable on 4L [place]. Plan: -Volume management with HD -Continue digoxin 0.125 mg MWF -Supplemental O2, SpO2 goal >92% -BiPAP daily at bedtime   Chronic somatic and neuropathic pain Chronic opiate therapy Plan: -Continue tylenol 650 mg q6h PRN mild pain, fever -Continue venlafaxine 37.5 mg daily  -Continue lyrica 50 mg daily  -Continue oxycodone 10 mg q6h PRN severe pain  Hypothyroidism Plan: -Continue home synthroid 25 mcg daily   Depression Plan: -Continue home venlafaxine 37.5 mg daily  R great toe wound, c/f ingrown toenail Toenail avulsion with podiatry last admission, c/f ingrown toe nail at that time. He has been picking at his toe nails and tearing them off; one began to bleed this morning after doing so. Overall stable, advised to stop picking at toe nails. Plan: -Wound care per orders  Best Practice: Diet: Renal diet IVF: None VTE: SCDs Start: 09/22/23 2037 Place and maintain sequential compression device Start: 09/22/23 1038 Code: Full AB: None DISPO: Anticipated discharge to Home pending  confirmation of insurance status .  Signature: Champ Mungo, D.O.  Internal Medicine Resident, PGY-3 [facility]  Pager: [phone]   Please contact the on call pager after 5 pm and on weekends at [phone].

## 2023-09-23 NOTE — Progress Notes (Signed)
  Inpatient Rehab Admissions Coordinator :  Per therapy recommendations, patient was screened for CIR candidacy by Ottie Glazier RN MSN. Patient with repeated admissions over the past several months. Was at [facility] CIR 11/8 until 06/20/23 and went home with his Mom . Transportation issues with OP hemodialysis due to no ramp, ease for wheelchair on sidewalk, transportation, etc. Has been at [facility] level rehab since 07/11/24. We will not pursue CIR at this time here at [facility]. Recommend other rehab venues to be pursued. Please call me with any questions.  Ottie Glazier RN MSN Admissions Coordinator [phone]

## 2023-09-23 NOTE — Progress Notes (Addendum)
  [facility] Progress Note   Subjective: Seen on HD HR 130s-Afib/Aflutter. Reducing UFG. Alert, oriented, back to baseline.    Objective Vitals:   09/23/23 1016 09/23/23 1035 09/23/23 1040 09/23/23 1100  BP: (!) 108/91  113/85 (!) 114/90  Pulse: (!) 134 64 (!) 135 (!) 119  Resp: 17 (!) 27 (!) 27 (!) 27  Temp: 98.3 F (36.8 C) 97.8 F (36.6 C)    TempSrc: Oral     SpO2: 98% 99% 100% 96%  Weight: (!) 167 kg     Height:       General: morbidly obese male in no acute distress. Head: Normocephalic, atraumatic, sclera non-icteric, mucus membranes are moist Neck: Supple. JVD difficult to assess D/T body habitus.  Lungs: Decreased in bases otherwise CTAB. Resp shallow, no WOB Heart: S1,S2 RRR No M/R/G Abdomen: Obese, NT, NABS. Lower extremities: 2+ pitting ankle edme 2+ BLE edema  Neuro: MAE X 4. A & O X 3 Dialysis Access: [facility] blood lines connected    Additional Objective Labs: Basic Metabolic Panel: Recent Labs  Lab 09/21/23 2104 09/21/23 2215 09/22/23 1031 09/23/23 0511  NA 130* 127* 132* 133*  K 6.2* 6.0* 5.1 3.9  CL 86*  --  91* 93*  CO2 17*  --  25 24  GLUCOSE 102*  --  62* 109*  BUN 56*  --  31* 48*  CREATININE 9.88*  --  6.63* 8.11*  CALCIUM 9.6  --  9.0 8.6*  PHOS  --   --  5.6* 7.1*   Liver Function Tests: Recent Labs  Lab 09/22/23 1031 09/23/23 0511  ALBUMIN 3.3* 3.2*   No results for input(s): "LIPASE", "AMYLASE" in the last 168 hours. CBC: Recent Labs  Lab 09/21/23 2104 09/21/23 2215 09/22/23 1549 09/23/23 0511  WBC 10.8*  --  7.3 6.9  HGB 11.2* 12.6* 9.1* 9.9*  HCT 34.9* 37.0* 27.5* 28.7*  MCV 104.5*  --  102.2* 100.7*  PLT 254  --  186 184   Blood Culture    Component Value Date/Time   SDES BLOOD BLOOD RIGHT HAND 05/27/2023 0959   SPECREQUEST  05/27/2023 0959    BOTTLES DRAWN AEROBIC AND ANAEROBIC Blood Culture adequate volume   CULT  05/27/2023 0959    NO GROWTH 5 DAYS Performed at [facility], [address], [place], [place] [address]    REPTSTATUS 06/01/2023 FINAL 05/27/2023 6045    Cardiac Enzymes: No results for input(s): "CKTOTAL", "CKMB", "CKMBINDEX", "TROPONINI" in the last 168 hours. CBG: No results for input(s): "GLUCAP" in the last 168 hours. Iron Studies: No results for input(s): "IRON", "TIBC", "TRANSFERRIN", "FERRITIN" in the last 72 hours. @lablastinr3 @ Studies/Results: DG Chest Portable 1 View Result Date: 09/21/2023 CLINICAL DATA:  Shortness of breath EXAM: PORTABLE CHEST 1 VIEW COMPARISON:  09/07/2023 FINDINGS: Dialysis catheter is again seen and stable. Cardiac shadow is enlarged but also stable. Lungs are well aerated bilaterally. No focal infiltrate or effusion is seen. No bony abnormality is noted. IMPRESSION: Stable cardiomegaly.  No acute abnormality is seen. Electronically Signed   By: Alcide Clever M.D.   On: 09/21/2023 21:30   Medications:  anticoagulant sodium citrate      apixaban  5 mg Oral BID   Chlorhexidine Gluconate Cloth  6 each Topical Q0600   cinacalcet  30 mg Oral Q T,Th,Sa-HD   digoxin  0.125 mg Oral Q M,W,F-HD   levothyroxine  25 mcg Oral Q0600   midodrine  30 mg Oral TID WC  pregabalin  50 mg Oral Daily   sevelamer carbonate  1,600 mg Oral TID WC   venlafaxine XR  37.5 mg Oral Daily     Dialysis Orders: Center:[facility] MWF 4.5 hrs 180NRe 400/800 155.5 kg 2.0 K/2.0 Ca TDC - no Heparin HIT + Flush TDC ports with citrate - Mircera 200 mcg IV q 2 weeks (last dose 08/24/2023) - Sensipar 30 mg PO three times per week - Venofer 100 mg IV X 10 ordered in January. Has gotten 4/10 doses Last Tsat 17% 09/15/2023   PATIENT DOES NOT ACCEPT BLOOD PRODUCTS.  NO HEPARIN   Assessment/Plan: PAF-HR currently 130s. Decreased goal. Med adjustments per protocol.  Toxic metabolic encephalopathy-W/U per primary. Now resolved. No evidence of uremia  Hyperkalemia-missed HD 09/21/2023. Had HD over night 09/22/2023. Resolved with HD.   ESRD -  MWF. Next HD  09/26/2023  Hypertension/volume  - Generalized anascarca present on exam. CXR unremarkable. UF as tolerated. Net UF 4 liters 02/27, still very much above OP EDW. Continue to lower EDW but elevated HR a deterrent today. UF as tolerated.   Anemia  - HGB 11.2. No recent ESA. Hold Fe load for now.   Metabolic bone disease -  Resume VDRA, sensipar and binders when able to eat. Chronic hyperphosphatemia at OP clinic  Nutrition - Low Albumin. Add protein supplements  Rita H. Brown NP-C 09/23/2023, 11:14 AM  [employer] [phone]    Seen and examined independently.  Agree with note and exam as documented above by physician extender and as noted here.  Please see my procedure note from today  Estanislado Emms, MD 09/23/2023  1:08 PM

## 2023-09-23 NOTE — Procedures (Signed)
 Seen and examined on dialysis.  Blood pressure 114/90 and HR fluctuating and at times up to the 140's range.  Decreased UF goal by 0.5 kg.   He is above his EDW and has a reported history of noncompliance per NP.  Full note to follow   Needs to optimize PAF/a flutter to be able to optimize HD.  He requested to dialyze 3 hours and I stated that I did not recommend this  Estanislado Emms, MD 09/23/2023 11:25 AM

## 2023-09-23 NOTE — Evaluation (Signed)
 Physical Therapy Evaluation Patient Details Name: Phillip Young MRN: [id number] DOB: 08/10/1985 Today's Date: 09/23/2023  History of Present Illness  Pt is a 38 y/o M admitted on 09/21/23 after presenting with AMS. Pt just d/c from hospital on 2/17 for tx of a-fib with RVR & pt underwent hemodialysis. Pt is being treated for hyperkalemia, acute toxic/metabolic encephalopathy. PMH: chronic respiratory failure, CHF, HTN, nonischemic cardiomyopathy, a-flutter, obesity, prediabetes  Clinical Impression  Pt seen for PT evaluation with pt agreeable to tx. Pt reports prior to admission he was at rehab but is hopeful to go home with his mother at d/c. Pt notes his home has a level entry at the back & his mother & uncles can assist him. On this date, pt is able to complete STS but only attempts from significantly elevated EOB per pt request, despite PT/OT encouraging him to attempt from lower height. Pt ambulates a few feet in room with RW & supervision<>CGA but is limited by fatigue. Pt would benefit from ongoing PT services to address activity tolerance, balance, gait & transfers.         If plan is discharge home, recommend the following: A little help with walking and/or transfers;A little help with bathing/dressing/bathroom;Assistance with cooking/housework;Assist for transportation;Help with stairs or ramp for entrance   Can travel by private vehicle   Yes    Equipment Recommendations None recommended by PT  Recommendations for Other Services  Rehab consult    Functional Status Assessment Patient has had a recent decline in their functional status and demonstrates the ability to make significant improvements in function in a reasonable and predictable amount of time.     Precautions / Restrictions Precautions Precautions: Fall Precaution/Restrictions Comments: Watch BP and HR Restrictions Weight Bearing Restrictions Per Provider Order: No      Mobility  Bed Mobility Overal bed  mobility: Needs Assistance Bed Mobility: Supine to Sit     Supine to sit: Supervision, HOB elevated, Used rails          Transfers Overall transfer level: Needs assistance Equipment used: Rolling walker (2 wheels) Transfers: Sit to/from Stand Sit to Stand: Supervision, From elevated surface           General transfer comment: STS from significantly elevated EOB (almost high perch position) despite PT/OT encouraging pt to attempt from lower bed height (educated pt on need to build up seat height at home)    Ambulation/Gait Ambulation/Gait assistance: Supervision, Contact guard assist Gait Distance (Feet): 5 Feet Assistive device: Rolling walker (2 wheels)   Gait velocity: decreased     General Gait Details: Pt ambulates a couple steps forwards & backwards then is able to take steps to L along EOB with RW & supervision<>CGA. Extra time required 2/2 fatigue.  Stairs            Wheelchair Mobility     Tilt Bed    Modified Rankin (Stroke Patients Only)       Balance Overall balance assessment: Needs assistance Sitting-balance support: No upper extremity supported, Single extremity supported, Feet supported Sitting balance-Leahy Scale: Good Sitting balance - Comments: able to sit EOB without LOB   Standing balance support: Bilateral upper extremity supported, During functional activity, Reliant on assistive device for balance Standing balance-Leahy Scale: Fair                               Pertinent Vitals/Pain Pain Assessment Pain Assessment: Faces Faces Pain Scale:  Hurts whole lot Pain Location: BLE pain Pain Descriptors / Indicators: Grimacing, Guarding Pain Intervention(s): Monitored during session, Limited activity within patient's tolerance    Home Living Family/patient expects to be discharged to:: Private residence Living Arrangements: Parent Available Help at Discharge: Family;Available 24 hours/day Type of Home: Apartment Home  Access: Level entry   Entrance Stairs-Number of Steps: level entrance at the back   Home Layout: One level Home Equipment: Wheelchair - Forensic psychologist (2 wheels);BSC/3in1;Hospital bed;Shower seat;Other (comment) (hoyer lift (per chart))      Prior Function               Mobility Comments: working on gait in rehab, ambulating up to 30 ft per pt report       Extremity/Trunk Assessment   Upper Extremity Assessment Upper Extremity Assessment: Overall WFL for tasks assessed    Lower Extremity Assessment Lower Extremity Assessment: Generalized weakness (BLE edema & neuropathic pain, decreased L ankle dorsiflexion)       Communication   Communication Communication: No apparent difficulties    Cognition Arousal: Alert Behavior During Therapy: WFL for tasks assessed/performed   PT - Cognitive impairments: No apparent impairments                         Following commands: Intact       Cueing       General Comments General comments (skin integrity, edema, etc.): Pt received on 6L/min, requested to turn down to 4L/min as pt only on 6L/min to sleep at night. Lowest SpO2 91% after gait. Max HR 161 bpm in standing (pt noted to be in a-fib).    Exercises     Assessment/Plan    PT Assessment Patient needs continued PT services  PT Problem List Decreased strength;Cardiopulmonary status limiting activity;Pain;Decreased range of motion;Decreased activity tolerance;Decreased balance;Decreased mobility;Decreased skin integrity;Decreased safety awareness;Decreased knowledge of use of DME       PT Treatment Interventions DME instruction;Gait training;Stair training;Functional mobility training;Therapeutic activities;Therapeutic exercise;Balance training;Neuromuscular re-education;Patient/family education;Wheelchair mobility training;Modalities    PT Goals (Current goals can be found in the Care Plan section)  Acute Rehab PT Goals Patient Stated Goal: get  better, go home PT Goal Formulation: With patient Time For Goal Achievement: 10/07/23 Potential to Achieve Goals: Fair    Frequency Min 1X/week     Co-evaluation PT/OT/SLP Co-Evaluation/Treatment: Yes Reason for Co-Treatment: For patient/therapist safety (unsure of pt's need for +2 assist 2/2 pt requiring +2 in the past; also in attempts to complete PT & OT evaluation prior to pt leaves unit for dialysis) PT goals addressed during session: Mobility/safety with mobility;Balance         AM-PAC PT "6 Clicks" Mobility  Outcome Measure Help needed turning from your back to your side while in a flat bed without using bedrails?: A Little Help needed moving from lying on your back to sitting on the side of a flat bed without using bedrails?: A Little Help needed moving to and from a bed to a chair (including a wheelchair)?: A Little Help needed standing up from a chair using your arms (e.g., wheelchair or bedside chair)?: A Lot Help needed to walk in hospital room?: A Little Help needed climbing 3-5 steps with a railing? : Total 6 Click Score: 15    End of Session Equipment Utilized During Treatment: Oxygen Activity Tolerance: Patient tolerated treatment well;Patient limited by fatigue Patient left: in bed;with call bell/phone within reach (transport tech entering room) Nurse Communication: Mobility status (  O2) PT Visit Diagnosis: Unsteadiness on feet (R26.81);Other abnormalities of gait and mobility (R26.89);Muscle weakness (generalized) (M62.81);Difficulty in walking, not elsewhere classified (R26.2);Pain Pain - Right/Left:  (bilateral) Pain - part of body: Leg    Time: 0927-0949 PT Time Calculation (min) (ACUTE ONLY): 22 min   Charges:   PT Evaluation $PT Eval Moderate Complexity: 1 Mod   PT General Charges $$ ACUTE PT VISIT: 1 Visit         Aleda Grana, PT, DPT 09/23/23, 10:11 AM   Sandi Mariscal 09/23/2023, 10:09 AM

## 2023-09-23 NOTE — Evaluation (Signed)
 Occupational Therapy Evaluation Patient Details Name: Phillip Young MRN: [id number] DOB: 1986-01-01 Today's Date: 09/23/2023   History of Present Illness   Pt is a 38 y/o M admitted on 09/21/23 after presenting with AMS. Pt just d/c from hospital on 2/17 for tx of a-fib with RVR & pt underwent hemodialysis. Pt is being treated for hyperkalemia, acute toxic/metabolic encephalopathy. PMH: chronic respiratory failure, CHF, HTN, nonischemic cardiomyopathy, a-flutter, obesity, prediabetes     Clinical Impressions PTA pt was at [facility] inpatient rehab and reports being mod I in ADLs and ambulating up to 69GE with RW. Pt currently able to stand and ambulate 70ft with CGA but needs very elevated surface to stand without assist, he also has poor activity tolerance. Pt c/o SOB and fatigue after 92ft ambulation, also needs Max A to setup A for ADLs. He has a decreased regard for his fluid restrictions as he asked for more juice after having 2 just 30 mins prior to session beginning. Pt would benefit from continued acute skilled OT services to address deficits including promoting activity tolerance. Patient has the potential to reach Mod I and demos the ability to tolerate 3 hours of therapy. Pt would benefit from an intensive rehab program to help maximize functional independence.      If plan is discharge home, recommend the following:   A lot of help with bathing/dressing/bathroom;Assistance with cooking/housework;Help with stairs or ramp for entrance;A little help with walking and/or transfers;Assist for transportation     Functional Status Assessment   Patient has had a recent decline in their functional status and demonstrates the ability to make significant improvements in function in a reasonable and predictable amount of time.     Equipment Recommendations   Other (comment) (electric scooter or similar)     Recommendations for Other Services   Rehab consult      Precautions/Restrictions   Precautions Precautions: Fall Precaution/Restrictions Comments: Watch BP and HR Restrictions Weight Bearing Restrictions Per Provider Order: No     Mobility Bed Mobility Overal bed mobility: Needs Assistance Bed Mobility: Supine to Sit     Supine to sit: Supervision, HOB elevated, Used rails     General bed mobility comments: Pt left sitting EOB with transport team    Transfers Overall transfer level: Needs assistance Equipment used: Rolling walker (2 wheels) Transfers: Sit to/from Stand Sit to Stand: Supervision, From elevated surface           General transfer comment: STS from significantly elevated EOB (almost high perch position) despite PT/OT encouraging pt to attempt from lower bed height (educated pt on need to build up seat height at home)      Balance Overall balance assessment: Needs assistance Sitting-balance support: No upper extremity supported, Single extremity supported, Feet supported Sitting balance-Leahy Scale: Good Sitting balance - Comments: able to sit EOB without LOB   Standing balance support: Bilateral upper extremity supported, During functional activity, Reliant on assistive device for balance Standing balance-Leahy Scale: Fair                             ADL either performed or assessed with clinical judgement   ADL Overall ADL's : Needs assistance/impaired Eating/Feeding: Independent;Sitting   Grooming: Set up;Sitting   Upper Body Bathing: Sitting;Contact guard assist   Lower Body Bathing: Sitting/lateral leans;Maximal assistance   Upper Body Dressing : Contact guard assist;Sitting   Lower Body Dressing: Sitting/lateral leans;Moderate assistance Lower Body Dressing Details (indicate cue  type and reason): to don bilat slides, swelling in bilat feet making it challenging for pt to slip on Toilet Transfer: Contact guard assist;Rolling walker (2 wheels);BSC/3in1;Stand-pivot   Toileting-  Clothing Manipulation and Hygiene: Sit to/from stand;Contact guard assist       Functional mobility during ADLs: Contact guard assist;Rolling walker (2 wheels) (5 steps) General ADL Comments: Discussed how higher surfaces are better for pt, would benefit from use of cushions and pillows to elevate furniture at home     Vision         Perception         Praxis         Pertinent Vitals/Pain Pain Assessment Pain Assessment: Faces Faces Pain Scale: Hurts whole lot Pain Location: BLE pain Pain Descriptors / Indicators: Grimacing, Guarding Pain Intervention(s): Limited activity within patient's tolerance, Monitored during session, Repositioned     Extremity/Trunk Assessment Upper Extremity Assessment Upper Extremity Assessment: Overall WFL for tasks assessed   Lower Extremity Assessment Lower Extremity Assessment: Generalized weakness (BLE edema & neuropathic pain, decreased L ankle dorsiflexion)       Communication Communication Communication: No apparent difficulties   Cognition Arousal: Alert Behavior During Therapy: WFL for tasks assessed/performed               OT - Cognition Comments: Likes to joke                 Following commands: Intact       Cueing  General Comments          Exercises     Shoulder Instructions      Home Living Family/patient expects to be discharged to:: Private residence Living Arrangements: Parent Available Help at Discharge: Family;Available 24 hours/day Type of Home: Apartment Home Access: Level entry Entrance Stairs-Number of Steps: level entrance at the back   Home Layout: One level     Bathroom Shower/Tub: Chief Strategy Officer: Handicapped height Bathroom Accessibility: Yes How Accessible: Accessible via wheelchair Home Equipment: Wheelchair - Forensic psychologist (2 wheels);BSC/3in1;Hospital bed;Shower seat;Other (comment)          Prior Functioning/Environment                Mobility Comments: working on gait in rehab, ambulating up to 30 ft per pt report ADLs Comments: Mod I per pt report    OT Problem List: Decreased strength;Decreased activity tolerance;Impaired balance (sitting and/or standing);Cardiopulmonary status limiting activity;Impaired sensation;Obesity;Pain   OT Treatment/Interventions: Therapeutic exercise;Self-care/ADL training;Energy conservation;DME and/or AE instruction;Therapeutic activities;Patient/family education;Balance training      OT Goals(Current goals can be found in the care plan section)   Acute Rehab OT Goals Patient Stated Goal: To go home OT Goal Formulation: With patient Time For Goal Achievement: 10/07/23 Potential to Achieve Goals: Good   OT Frequency:  Min 2X/week    Co-evaluation   Reason for Co-Treatment: For patient/therapist safety (unsure of pt's need for +2 assist 2/2 pt requiring +2 in the past; also in attempts to complete PT & OT evaluation prior to pt leaves unit for dialysis) PT goals addressed during session: Mobility/safety with mobility;Balance OT goals addressed during session: ADL's and self-care      AM-PAC OT "6 Clicks" Daily Activity     Outcome Measure Help from another person eating meals?: None Help from another person taking care of personal grooming?: A Little Help from another person toileting, which includes using toliet, bedpan, or urinal?: A Little Help from another person bathing (including washing, rinsing, drying)?:  A Lot Help from another person to put on and taking off regular upper body clothing?: A Little Help from another person to put on and taking off regular lower body clothing?: A Lot 6 Click Score: 17   End of Session Equipment Utilized During Treatment: Gait belt;Rolling walker (2 wheels);Oxygen Nurse Communication: Mobility status  Activity Tolerance: Patient tolerated treatment well Patient left: in bed;with call bell/phone within reach;Other (comment) (transport  team sat bedside)  OT Visit Diagnosis: Unsteadiness on feet (R26.81);Other abnormalities of gait and mobility (R26.89);Muscle weakness (generalized) (M62.81)                Time: 1610-9604 OT Time Calculation (min): 23 min Charges:  OT General Charges $OT Visit: 1 Visit OT Evaluation $OT Eval Low Complexity: 1 Low  09/23/2023  AB, OTR/L  Acute Rehabilitation Services  Office: [phone]   Tristan Schroeder 09/23/2023, 10:21 AM

## 2023-09-23 NOTE — TOC CM/SW Note (Signed)
 Transition of Care [facility]) - Inpatient Brief Assessment   Patient Details  Name: Phillip Young MRN: [id number] Date of Birth: 1986-02-26  Transition of Care [facility]) CM/SW Contact:    Tom-Johnson, Hershal Coria, RN Phone Number: 09/23/2023, 5:06 PM   Clinical Narrative:  Patient presented to the ED from [facility] after becoming Somnolent. Patient admitted with Hyperkalemia. Patient was recently discharged from this hospital, was admitted with A-fib/RVR on 09/12/23 and went to [facility] for Rehab.   AIR recommended, they will not be pursuing [facility] admit at this time, recommend to return to [facility].  CM called and spoke with Victorino Dike, [facility] Liaison [phone]) and she states [facility] will not accept admission at this time. MD notified. Patient's Medicaid changed from [facility] to [facility] and policy will start tomorrow 09/24/23. CM called and spoke with [facility] [phone]) and she states to call on Monday as patient's insurance is inactive at this time until tomorrow 09/24/23.   CM will continue to follow.        Transition of Care Asessment: Insurance and Status: Insurance coverage has been reviewed Patient has primary care physician: Yes Home environment has been reviewed: Yes Prior level of function:: Modified Independent Prior/Current Home Services: Current home services Social Drivers of Health Review: SDOH reviewed no interventions necessary Readmission risk has been reviewed: Yes Transition of care needs: transition of care needs identified, TOC will continue to follow

## 2023-09-24 ENCOUNTER — Inpatient Hospital Stay (HOSPITAL_COMMUNITY)

## 2023-09-24 DIAGNOSIS — E875 Hyperkalemia: Secondary | ICD-10-CM | POA: Diagnosis not present

## 2023-09-24 LAB — RENAL FUNCTION PANEL
Albumin: 3.2 g/dL — ABNORMAL LOW (ref 3.5–5.0)
Anion gap: 16 — ABNORMAL HIGH (ref 5–15)
BUN: 43 mg/dL — ABNORMAL HIGH (ref 6–20)
CO2: 24 mmol/L (ref 22–32)
Calcium: 8.9 mg/dL (ref 8.9–10.3)
Chloride: 96 mmol/L — ABNORMAL LOW (ref 98–111)
Creatinine, Ser: 6.92 mg/dL — ABNORMAL HIGH (ref 0.61–1.24)
GFR, Estimated: 10 mL/min — ABNORMAL LOW (ref >60)
Glucose, Bld: 107 mg/dL — ABNORMAL HIGH (ref 70–99)
Phosphorus: 5.7 mg/dL — ABNORMAL HIGH (ref 2.5–4.6)
Potassium: 3.5 mmol/L (ref 3.5–5.1)
Sodium: 136 mmol/L (ref 135–145)

## 2023-09-24 LAB — HEPATITIS B SURFACE ANTIBODY, QUANTITATIVE: Hep B S AB Quant (Post): 73.8 m[IU]/mL

## 2023-09-24 LAB — GLUCOSE, CAPILLARY: Glucose-Capillary: 109 mg/dL — ABNORMAL HIGH (ref 70–99)

## 2023-09-24 MED ORDER — CHLORHEXIDINE GLUCONATE CLOTH 2 % EX PADS: 6.0000 | MEDICATED_PAD | Freq: Every day | CUTANEOUS | Status: DC

## 2023-09-24 NOTE — Plan of Care (Signed)

## 2023-09-24 NOTE — Progress Notes (Signed)
 Pt refused Hemodialysis today. MD Malen Gauze is aware and states, pt will be done Monday.

## 2023-09-24 NOTE — Progress Notes (Signed)
 Pt continues requesting fluid even though informed he is nearing his fluid limit for the day. Pt educated on consequences of exceeding fluid limit in setting of kidney failure. Pt requested more fluid. Fluid intake charted. Plan of care continues.

## 2023-09-24 NOTE — Progress Notes (Addendum)
 [facility] Progress Note   Subjective:   Patient seen and examined at bedside.  Reports tolerating dialysis well yesterday.  Tired today.  Denies CP, SOB, abdominal pain and n/v/d. No other specific complaints.   Objective Vitals:   09/23/23 1607 09/23/23 2037 09/24/23 0518 09/24/23 0934  BP: 125/78 (!) 128/101 (!) 128/111 120/82  Pulse:  70 (!) 127   Resp:  18 18 19   Temp: 98 F (36.7 C) 98.4 F (36.9 C) 98.5 F (36.9 C) 98.6 F (37 C)  TempSrc: Oral     SpO2: 97% 100% 100% 99%  Weight:      Height:       Physical Exam General:chronically ill appearing male in NAD, wearing CPAP Heart:IRIR, no mrg Lungs: nml WOB w/CPAP, scattered rhonchi Abdomen:soft, NTND Extremities: trace LE edema Dialysis Access: [facility]   [facility] Weights   09/21/23 2117 09/23/23 1016  Weight: (!) 161 kg (!) 167 kg    Intake/Output Summary (Last 24 hours) at 09/24/2023 1148 Last data filed at 09/24/2023 0607 Gross per 24 hour  Intake 600 ml  Output 3500 ml  Net -2900 ml    Additional Objective Labs: Basic Metabolic Panel: Recent Labs  Lab 09/22/23 1031 09/23/23 0511 09/24/23 0338  NA 132* 133* 136  K 5.1 3.9 3.5  CL 91* 93* 96*  CO2 25 24 24   GLUCOSE 62* 109* 107*  BUN 31* 48* 43*  CREATININE 6.63* 8.11* 6.92*  CALCIUM 9.0 8.6* 8.9  PHOS 5.6* 7.1* 5.7*   Liver Function Tests: Recent Labs  Lab 09/22/23 1031 09/23/23 0511 09/24/23 0338  ALBUMIN 3.3* 3.2* 3.2*   CBC: Recent Labs  Lab 09/21/23 2104 09/21/23 2215 09/22/23 1549 09/23/23 0511  WBC 10.8*  --  7.3 6.9  HGB 11.2* 12.6* 9.1* 9.9*  HCT 34.9* 37.0* 27.5* 28.7*  MCV 104.5*  --  102.2* 100.7*  PLT 254  --  186 184   Blood Culture    Component Value Date/Time   SDES BLOOD BLOOD RIGHT HAND 05/27/2023 0959   SPECREQUEST  05/27/2023 0959    BOTTLES DRAWN AEROBIC AND ANAEROBIC Blood Culture adequate volume   CULT  05/27/2023 0959    NO GROWTH 5 DAYS Performed at [facility], [address]  [place], [place] [address]    REPTSTATUS 06/01/2023 FINAL 05/27/2023 0959    CBG: Recent Labs  Lab 09/23/23 1738 09/24/23 0737  GLUCAP 128* 109*    Medications:   apixaban  5 mg Oral BID   Chlorhexidine Gluconate Cloth  6 each Topical Q0600   cinacalcet  30 mg Oral Q T,Th,Sa-HD   digoxin  0.125 mg Oral Q M,W,F-HD   levothyroxine  25 mcg Oral Q0600   midodrine  30 mg Oral TID WC   pregabalin  50 mg Oral Daily   sevelamer carbonate  1,600 mg Oral TID WC   venlafaxine XR  37.5 mg Oral Daily    Dialysis Orders: [facility] MWF 4.5 hrs 180NRe 400/800 155.5 kg 2.0 K/2.0 Ca TDC - no Heparin HIT + Flush TDC ports with citrate - Mircera 200 mcg IV q 2 weeks (last dose 08/24/2023) - Sensipar 30 mg PO three times per week - Venofer 100 mg IV X 10 ordered in January. Has gotten 4/10 doses Last Tsat 17% 09/15/2023   PATIENT DOES NOT ACCEPT BLOOD PRODUCTS.  NO HEPARIN   Assessment/Plan: PAF-HR currently 120s. Meds per PMD. Toxic metabolic encephalopathy-W/U per primary. Now resolved. No evidence of uremia  Hyperkalemia-missed  HD 09/21/2023. Had HD over night 09/22/2023. Resolved with HD.  LLE coolness - w/numbness. W/u per PMD.  ESRD -  MWF. Next HD 09/26/2023  Hypotension - on midodrine 30mg  TID.  Volume  - Generalized anascarca present on exam. CXR unremarkable. UF as tolerated. Net UF 4 liters 02/27, 3.5L on 2/28.  Continue UF as tolerated.  Reinforce fluid restrictions.    Anemia  - HGB 9.9. No recent ESA. Hold Fe load for now. Follow Hgb and start ESA if remains <10.   Metabolic bone disease -  Calcium in goal.  Phos better.  Continue binders, sensipar and VDRA.   Nutrition - Low Albumin. Add protein supplements  Virgina Norfolk, PA-C [facility] 09/24/2023,11:48 AM  LOS: 3 days    Seen and examined independently.  Agree with note and exam as documented above by physician extender and as noted here.  He apologized for wanting to sign off yesterday -he states that he  stayed for the rest of the treatment and didn't cramp.  He feels like he has several kg above his EDW still on and his HR runs up with movement or even tasks like eating per the RN.    general adult  in bed in no acute distress HEENT normocephalic atraumatic extraocular movements intact sclera anicteric Neck supple trachea midline Lungs clear to auscultation bilaterally normal work of breathing at rest and increased with exertion; on supplemental oxygen Heart S1S2 no rub Abdomen soft nontender distended, morbidly obese habitus Extremities trace edema  Psych normal mood and affect Neuro - alert and oriented x 3 provides hx and follows commands  Access - RIJ tunn catheter  # Metabolic encephalopathy - May be secondary to missed dialysis however also note patient's report of pain medications being increased - Much improved   # End-stage renal disease on hemodialysis - HD per Monday Wednesday Friday schedule - will plan for a UF-only treatment today - per the HD charge RN may be tonight which is fine   # Hyperkalemia Improved Status post hemodialysis - UF only today   # Afib/aflutter - optimize volume status with additional tx today to try and help with afib however note that his HR limits UF  Estanislado Emms, MD 09/24/2023  1:11 PM

## 2023-09-24 NOTE — Progress Notes (Signed)
 Mobility Specialist: Progress Note   09/24/23 1259  Mobility  Activity Refused mobility  Mobility Referral Yes  Mobility Specialist Start Time (ACUTE ONLY) 1007  Mobility Specialist Stop Time (ACUTE ONLY) 1007  Mobility Specialist Time Calculation (min) (ACUTE ONLY) 0 min    MS walked in and introduced herself. Pt responded with a thumbs up when asked how he's doing but immediately stopped responding in anyway when asked if he could participate in therapy. Ms asked multiple times before leaving.   Will f/u if time allows.   Maurene Capes Mobility Specialist Please contact via SecureChat or Rehab office at [phone]

## 2023-09-25 ENCOUNTER — Encounter (HOSPITAL_COMMUNITY)

## 2023-09-25 LAB — RENAL FUNCTION PANEL
Albumin: 3.2 g/dL — ABNORMAL LOW (ref 3.5–5.0)
Anion gap: 18 — ABNORMAL HIGH (ref 5–15)
BUN: 61 mg/dL — ABNORMAL HIGH (ref 6–20)
CO2: 20 mmol/L — ABNORMAL LOW (ref 22–32)
Calcium: 9.6 mg/dL (ref 8.9–10.3)
Chloride: 96 mmol/L — ABNORMAL LOW (ref 98–111)
Creatinine, Ser: 8.25 mg/dL — ABNORMAL HIGH (ref 0.61–1.24)
GFR, Estimated: 8 mL/min — ABNORMAL LOW (ref >60)
Glucose, Bld: 104 mg/dL — ABNORMAL HIGH (ref 70–99)
Phosphorus: 6.6 mg/dL — ABNORMAL HIGH (ref 2.5–4.6)
Potassium: 4.7 mmol/L (ref 3.5–5.1)
Sodium: 134 mmol/L — ABNORMAL LOW (ref 135–145)

## 2023-09-25 LAB — LACTIC ACID, PLASMA: Lactic Acid, Venous: 1.6 mmol/L (ref 0.5–1.9)

## 2023-09-25 MED ORDER — ORAL CARE MOUTH RINSE: 15.0000 mL | OROMUCOSAL | Status: DC | PRN

## 2023-09-25 MED ORDER — CHLORHEXIDINE GLUCONATE CLOTH 2 % EX PADS: 6.0000 | MEDICATED_PAD | Freq: Every day | CUTANEOUS | Status: DC

## 2023-09-25 MED ORDER — CINACALCET HCL 30 MG PO TABS
30.0000 mg | ORAL_TABLET | ORAL | Status: DC
  Administered 2023-09-26 – 2023-09-28 (×2): 30 mg via ORAL
  Filled 2023-09-25 (×2): qty 1

## 2023-09-25 MED ORDER — HYDROXYZINE HCL 25 MG PO TABS
25.0000 mg | ORAL_TABLET | Freq: Three times a day (TID) | ORAL | Status: DC | PRN
Start: 2023-09-25 — End: 2023-09-29
  Administered 2023-09-25 (×2): 25 mg via ORAL
  Filled 2023-09-25 (×2): qty 1

## 2023-09-25 NOTE — Plan of Care (Signed)
  Problem: Nutritional: Goal: Ability to make healthy dietary choices will improve Outcome: Not Progressing   

## 2023-09-25 NOTE — Progress Notes (Addendum)
 [facility] Progress Note   Subjective:   Patient seen and examined at bedside.  Requesting individual hand sanitizer for personal use.  Asked nurse and told he was given 2 small bottles that were in supply closet but he wanted a specific type they do not have.  Reports he is developing a sore throat.  Denies fever, chills, dysphagia, HA, CP, SOB, abdominal pain and n/v/d.  Refused dialysis yesterday.  Objective Vitals:   09/24/23 1607 09/24/23 2010 09/25/23 0435 09/25/23 0831  BP: 99/77 116/80 (!) 143/116 (!) 129/90  Pulse:  62 (!) 102 (!) 114  Resp: 18 20 20    Temp: 98.3 F (36.8 C) 98 F (36.7 C) 98.7 F (37.1 C) 98 F (36.7 C)  TempSrc:  Oral Oral Oral  SpO2: 100% 100% 100% 100%  Weight:      Height:       Physical Exam General:chronically ill appearing, obese male in NAD Heart:+tachycardia Lungs:CTAB, nml WOB on 3L O2 Abdomen:NTND, soft Extremities:trace LE edema b/l, +chronic skin changes, dressing on 1st and 5th RLE digits Dialysis Access: [facility]   [facility] Weights   09/21/23 2117 09/23/23 1016  Weight: (!) 161 kg (!) 167 kg    Intake/Output Summary (Last 24 hours) at 09/25/2023 1257 Last data filed at 09/25/2023 0751 Gross per 24 hour  Intake 1378 ml  Output 0 ml  Net 1378 ml    Additional Objective Labs: Basic Metabolic Panel: Recent Labs  Lab 09/23/23 0511 09/24/23 0338 09/25/23 0631  NA 133* 136 134*  K 3.9 3.5 4.7  CL 93* 96* 96*  CO2 24 24 20*  GLUCOSE 109* 107* 104*  BUN 48* 43* 61*  CREATININE 8.11* 6.92* 8.25*  CALCIUM 8.6* 8.9 9.6  PHOS 7.1* 5.7* 6.6*   Liver Function Tests: Recent Labs  Lab 09/23/23 0511 09/24/23 0338 09/25/23 0631  ALBUMIN 3.2* 3.2* 3.2*   CBC: Recent Labs  Lab 09/21/23 2104 09/21/23 2215 09/22/23 1549 09/23/23 0511  WBC 10.8*  --  7.3 6.9  HGB 11.2* 12.6* 9.1* 9.9*  HCT 34.9* 37.0* 27.5* 28.7*  MCV 104.5*  --  102.2* 100.7*  PLT 254  --  186 184     Recent Labs  Lab 09/23/23 1738  09/24/23 0737  GLUCAP 128* 109*    Medications:   apixaban  5 mg Oral BID   Chlorhexidine Gluconate Cloth  6 each Topical Q0600   Chlorhexidine Gluconate Cloth  6 each Topical Q0600   cinacalcet  30 mg Oral Q T,Th,Sa-HD   digoxin  0.125 mg Oral Q M,W,F-HD   levothyroxine  25 mcg Oral Q0600   midodrine  30 mg Oral TID WC   pregabalin  50 mg Oral Daily   sevelamer carbonate  1,600 mg Oral TID WC   venlafaxine XR  37.5 mg Oral Daily    Dialysis Orders: [facility] MWF 4.5 hrs 180NRe 400/800 155.5 kg 2.0 K/2.0 Ca TDC - no Heparin HIT + Flush TDC ports with citrate - Mircera 200 mcg IV q 2 weeks (last dose 08/24/2023) - Sensipar 30 mg PO three times per week - Venofer 100 mg IV X 10 ordered in January. Has gotten 4/10 doses Last Tsat 17% 09/15/2023   PATIENT DOES NOT ACCEPT BLOOD PRODUCTS.  NO HEPARIN   Assessment/Plan: PAF-HR currently 120-130s. Meds per PMD. Toxic metabolic encephalopathy-W/U per primary. Now resolved. No evidence of uremia  Hyperkalemia- Resolved with HD.  LLE coolness - w/numbness. W/u per PMD.  ESRD -  MWF. Next  HD 09/26/2023  Hypotension - on midodrine 30mg  TID.  Volume  - Generalized anascarca present on exam. CXR unremarkable.  Net UF 4 liters 02/27, 3.5L on 2/28.  Continue UF as tolerated.  Reinforce fluid restrictions.    Anemia  - HGB 9.9. No recent ESA. Hold Fe load for now. Follow Hgb and start ESA if remains <10.   Metabolic bone disease -  Calcium in goal.  Phos variable.  Continue binders, sensipar and VDRA.   Nutrition - Low Albumin. Add protein supplements  Virgina Norfolk, PA-C [facility] 09/25/2023,12:57 PM  LOS: 4 days    Seen and examined independently.  Agree with note and exam as documented above by physician extender and as noted here.  He later refused the extra HD that he had agreed to and requested yesterday.  He states he wears between 2-5 liters oxygen normally.  He states "my breathing is fine".  He's on 5 liters  oxygen.  He is frustrated about losing his voice, about getting intubated in the past, and about his leg pain.    general adult  in bed in no acute distress HEENT normocephalic atraumatic extraocular movements intact sclera anicteric Neck supple trachea midline Lungs clear to auscultation bilaterally normal work of breathing at rest and increased with exertion; on supplemental oxygen 5 liters.   Heart S1S2 no rub Abdomen soft nontender distended, morbidly obese habitus Extremities no pitting edema  Psych normal mood and affect Neuro - alert and oriented x 3 provides hx and follows commands  Access - RIJ tunn catheter  # Metabolic encephalopathy - May be secondary to missed dialysis however also note patient's report of pain medications being increased - Much improved or resolved   # End-stage renal disease on hemodialysis - HD per Monday Wednesday Friday schedule - We offered an extra tx sat which he later refused   # Hyperkalemia Improved Status post hemodialysis   # Afib/aflutter - optimize volume status to try and help with afib however note that his HR limits UF - optimize afib regimen per primary team    Estanislado Emms, MD 09/25/2023  1:14 PM

## 2023-09-25 NOTE — Progress Notes (Signed)
 HD#4 Subjective:   Summary: Phillip Young is a 38 y.o. male with multiple significant chronic conditions including ESRD with HD, HFrEF, atrial fibrillation on Eliquis and digoxin, chronic respiratory failure 2/2 amiodarone toxicity, BMI 49, T2DM, and polyneuropathy returning to [facility] from SNF d/t toxic metabolic encephalopathy.   Overnight Events: Upset about his condition, factors outside of his control.   Discussed his current condition and some of the frustrations he has had. He has been struggling with anxiety and is upset about some remarks that were made towards him by staff at various times. We will continue to focus on getting better.  Objective:  Vital signs in last 24 hours: Vitals:   09/24/23 0934 09/24/23 1607 09/24/23 2010 09/25/23 0435  BP: 120/82 99/77 116/80 (!) 143/116  Pulse:   62 (!) 102  Resp: 19 18 20 20   Temp: 98.6 F (37 C) 98.3 F (36.8 C) 98 F (36.7 C) 98.7 F (37.1 C)  TempSrc:   Oral Oral  SpO2: 99% 100% 100% 100%  Weight:      Height:       Supplemental O2: Nasal Cannula SpO2: 100 % O2 Flow Rate (L/min): 5 L/min FiO2 (%): 40 %   Physical Exam:  Constitutional: Alert, NAD Cardiovascular: regular rate and rhythm, no m/r/g, TDC in place, no surrounding erythema, no TTP Pulmonary/Chest: normal work of breathing on 4L , anterior auscultation clear MSK: bilateral lower extremity edema. Bandage applied to the 1st toe.   Filed Weights   09/21/23 2117 09/23/23 1016  Weight: (!) 161 kg (!) 167 kg      Intake/Output Summary (Last 24 hours) at 09/25/2023 0734 Last data filed at 09/25/2023 0600 Gross per 24 hour  Intake 2038 ml  Output 0 ml  Net 2038 ml   Net IO Since Admission: -4,382 mL [09/25/23 0734]  Pertinent Labs:    Latest Ref Rng & Units 09/23/2023    5:11 AM 09/22/2023    3:49 PM 09/21/2023   10:15 PM  CBC  WBC 4.0 - 10.5 K/uL 6.9  7.3    Hemoglobin 13.0 - 17.0 g/dL 9.9  9.1  16.1   Hematocrit 39.0 - 52.0 % 28.7  27.5  37.0    Platelets 150 - 400 K/uL 184  186         Latest Ref Rng & Units 09/25/2023    6:31 AM 09/24/2023    3:38 AM 09/23/2023    5:11 AM  CMP  Glucose 70 - 99 mg/dL 096  045  409   BUN 6 - 20 mg/dL 61  43  48   Creatinine 0.61 - 1.24 mg/dL 8.11  9.14  7.82   Sodium 135 - 145 mmol/L 134  136  133   Potassium 3.5 - 5.1 mmol/L 4.7  3.5  3.9   Chloride 98 - 111 mmol/L 96  96  93   CO2 22 - 32 mmol/L 20  24  24    Calcium 8.9 - 10.3 mg/dL 9.6  8.9  8.6     Assessment/Plan:   Principal Problem:   Hyperkalemia Active Problems:   Acute encephalopathy  ESRD on HD Hyperkalemia Hypotension Anemia of CKD Nephrology onboard and appreciate their help. Has had trouble in past with liable pressures. Per Nephro - above EDW. Likely contributing to his a-fib - Appreciate nephrology's management - Continue midodrine 30 mg TID  - Continue Cinacalcet 30 mg non-dialysis days - Continue Renvela 1600 mg 3 times daily with meals - Daily  CBC, RFP  Paroxysmal atrial fibrillation In and out of a-fib this admission. Rates improved today. Will continue digoxin and volume management with dialysis.  -Telemetry -continue eliquis 5 mg bid -continue digoxin 0.125 mg MWF  LLE coolness Lactic Acidosis Continued bilaterally coolness of the feet. Good pulses for me on exam today. Difficult to gauge changes in pain and sensation due to his severe longstanding neuropathy.  Plan: -Will order ABIs; VVS consult if indicated by results - Lactic WNL today which is reassuring.  AoCHRF HFrEF [EF <20%]  Last EF 30-35% in October 2024 with moderately decreased LV function and global hypokinesis. Echo this admission with challenging views, however EF estimated at <20% with global hypokinesis. GDMT limited due to hypotension. No crackles, endorses orthopnea - Continue with HD - Continue with Digoxin    Acute encephalopathy - Resolved Anion gap metabolic acidosis Mentation at baseline. AGMA persistent, felt 2-2 uremia.  Last VBG WNL.    Chronic hypoxic respiratory failure History of presumed amiodarone toxicity Uses 2L [place] at home. He requires nightly BiPAP at home as well. - Keep O2 sats >92%  Chronic somatic and neuropathic pain Chronic opiate therapy Plan: -Continue tylenol 650 mg q6h PRN mild pain, fever -Continue venlafaxine 37.5 mg daily  -Continue lyrica 50 mg daily  -Continue oxycodone 10 mg q6h PRN severe pain    Hypothyroidism Last TSH at 4.9. - Continue synthyroid   Depression - Home med with effexor.   Disposition Planning Functional deconditioning BMI 49 Will see if he is able to get SNF vs CIR vs D/c home. Will likely d/c home once transportation to dialysis is set up.  - Appreciate SW help - Continue to engage with PT  R great toe wound, c/f ingrown toenail Toenail avulsion with podiatry last admission - Wound care per orders  Diet: Renal IVF: None,None VTE: DOAC Code: Full PT/OT recs: CIR  Dispo: Anticipated discharge - location pending - in 2 days pending improvement of volume status, clinical stability.   Lovie Macadamia MD Internal Medicine Resident PGY-1 Pager: [phone] Please contact the on call pager after 5 pm and on weekends at [phone].

## 2023-09-26 ENCOUNTER — Encounter (HOSPITAL_COMMUNITY)

## 2023-09-26 DIAGNOSIS — E875 Hyperkalemia: Secondary | ICD-10-CM | POA: Diagnosis not present

## 2023-09-26 LAB — RENAL FUNCTION PANEL
Albumin: 3.1 g/dL — ABNORMAL LOW (ref 3.5–5.0)
Anion gap: 19 — ABNORMAL HIGH (ref 5–15)
BUN: 73 mg/dL — ABNORMAL HIGH (ref 6–20)
CO2: 19 mmol/L — ABNORMAL LOW (ref 22–32)
Calcium: 9.7 mg/dL (ref 8.9–10.3)
Chloride: 95 mmol/L — ABNORMAL LOW (ref 98–111)
Creatinine, Ser: 9.52 mg/dL — ABNORMAL HIGH (ref 0.61–1.24)
GFR, Estimated: 7 mL/min — ABNORMAL LOW (ref >60)
Glucose, Bld: 82 mg/dL (ref 70–99)
Phosphorus: 8.4 mg/dL — ABNORMAL HIGH (ref 2.5–4.6)
Potassium: 6.1 mmol/L — ABNORMAL HIGH (ref 3.5–5.1)
Sodium: 133 mmol/L — ABNORMAL LOW (ref 135–145)

## 2023-09-26 LAB — CBC
HCT: 30.6 % — ABNORMAL LOW (ref 39.0–52.0)
Hemoglobin: 9.9 g/dL — ABNORMAL LOW (ref 13.0–17.0)
MCH: 33.6 pg (ref 26.0–34.0)
MCHC: 32.4 g/dL (ref 30.0–36.0)
MCV: 103.7 fL — ABNORMAL HIGH (ref 80.0–100.0)
Platelets: 162 10*3/uL (ref 150–400)
RBC: 2.95 MIL/uL — ABNORMAL LOW (ref 4.22–5.81)
RDW: 17.4 % — ABNORMAL HIGH (ref 11.5–15.5)
WBC: 6.6 10*3/uL (ref 4.0–10.5)
nRBC: 0 % (ref 0.0–0.2)

## 2023-09-26 MED ORDER — ANTICOAGULANT SODIUM CITRATE 4% (200MG/5ML) IV SOLN
5.0000 mL | Status: DC | PRN
  Administered 2023-09-26: 5 mL
  Filled 2023-09-26: qty 5

## 2023-09-26 MED ORDER — CLINDAMYCIN PHOSPHATE 2 % VA CREA
1.0000 | TOPICAL_CREAM | Freq: Two times a day (BID) | VAGINAL | Status: DC
  Filled 2023-09-26: qty 40

## 2023-09-26 MED ORDER — CLINDAMYCIN PHOSPHATE 1 % EX GEL
Freq: Two times a day (BID) | CUTANEOUS | Status: DC
  Filled 2023-09-26: qty 30

## 2023-09-26 NOTE — Discharge Summary (Incomplete)
 Name: Camaron Cammack MRN: [id number] DOB: Apr 26, 1986 38 y.o. PCP: [name], DO  Date of Admission: 09/21/2023  9:00 PM Date of Discharge:  09/26/2023 Attending Physician: Dr. 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 At the hospital follow up visit please address:  ***  Follow-up Recommendations: Consults: {misc; consult options:15850} Labs: {Lab list:16995} Studies: *** Medications: No medication changes at time of discharge.  Follow-up Appointments:   HOSPITAL COURSE:  Patient Summary: Mr. Alpert,  It was a pleasure to care for you during your time at [facility]. You were admitted because you were not acting like yourself. We think this was because of pain medication you were given.  I am not changing any of your medications when you go home. It is vital that you go to every hemodialysis session moving forward.  Please follow up in the [facility] ***.  My best, Dr. August Saucer   ESRD on HD Anion gap metabolic acidosis Anemia of CKD Patient underwent HD per nephrology while admitted. His BP remained stable throughout admission. His home midodrine, cinacalcet, and  renvela were continued.    LLE coolness ABIs showed ***.    Paroxysmal atrial fibrillation RVR at admission improved with volume management via HD. His home eliquis and digoxin were continued.   Chronic hypoxic respiratory failure Hx presumed amiodarone toxicity HFrEF (LV EF <20%) Home digoxin and was continued. He was maintained on supplemental O2.    Chronic somatic and neuropathic pain Chronic opiate therapy Managed with tylenol, venlafaxine, lyrica, and oxycodone.    Hypothyroidism Continued home synthroid.   Depression Continued home venlafaxine.   DISCHARGE INSTRUCTIONS:    SUBJECTIVE:  ***  OBJECTIVE:  Discharge Vitals:   BP 127/70   Pulse 100   Temp (!) 97.5 F (36.4 C) (Oral)   Resp 18   Ht 6' (1.829 m)   Wt (!) 167 kg   SpO2 100%   BMI 49.93 kg/m   Physical Exam   Pertinent Labs, Studies, and Procedures:     Latest Ref Rng & Units 09/26/2023    3:17 AM 09/23/2023    5:11 AM 09/22/2023    3:49 PM  CBC  WBC 4.0 - 10.5 K/uL 6.6  6.9  7.3   Hemoglobin 13.0 - 17.0 g/dL 9.9  9.9  9.1   Hematocrit 39.0 - 52.0 % 30.6  28.7  27.5   Platelets 150 - 400 K/uL 162  184  186        Latest Ref Rng & Units 09/26/2023    3:17 AM 09/25/2023    6:31 AM 09/24/2023    3:38 AM  CMP  Glucose 70 - 99 mg/dL 82  161  096   BUN 6 - 20 mg/dL 73  61  43   Creatinine 0.61 - 1.24 mg/dL 0.45  4.09  8.11   Sodium 135 - 145 mmol/L 133  134  136   Potassium 3.5 - 5.1 mmol/L 6.1  4.7  3.5   Chloride 98 - 111 mmol/L 95  96  96   CO2 22 - 32 mmol/L 19  20  24    Calcium 8.9 - 10.3 mg/dL 9.7  9.6  8.9     DG Chest Portable 1 View Result Date: 09/21/2023 CLINICAL DATA:  Shortness of breath EXAM: PORTABLE CHEST 1 VIEW COMPARISON:  09/07/2023 FINDINGS: Dialysis catheter is again seen and stable. Cardiac shadow is enlarged but also stable. Lungs are well aerated bilaterally. No focal infiltrate or effusion is seen. No bony abnormality is noted. IMPRESSION: Stable cardiomegaly.  No acute  abnormality is seen. Electronically Signed   By: Alcide Clever M.D.   On: 09/21/2023 21:30     Signed: Champ Mungo, D.O.  Internal Medicine Resident, PGY-2 [facility]  Pager: [phone]

## 2023-09-26 NOTE — Progress Notes (Signed)
 [facility] Progress Note   Subjective:    K 6.1. Seen in [facility]. On dialysis. Endorses Bilat LE pain, chronic neuropathy. Voice is horse s/p prior intubation.   Objective Vitals:   09/26/23 0900 09/26/23 0930 09/26/23 1000 09/26/23 1030  BP: (!) 111/97 106/82 96/78 91/72   Pulse: (!) 118 82 (!) 112 88  Resp: 17 (!) 23 19 (!) 23  Temp:      TempSrc:      SpO2:  100% 100% 100%  Weight:      Height:       Physical Exam General:chronically ill appearing, on 3L [place]  Heart: Tachy Lungs:Clear  Abdomen:NTND, soft Extremities: trace LE edema; no open wounds, lesions  Dialysis Access: [facility]   Filed Weights   09/21/23 2117 09/23/23 1016 09/26/23 0830  Weight: (!) 161 kg (!) 167 kg (!) 173 kg    Intake/Output Summary (Last 24 hours) at 09/26/2023 1044 Last data filed at 09/26/2023 0442 Gross per 24 hour  Intake 720 ml  Output 0 ml  Net 720 ml    Additional Objective Labs: Basic Metabolic Panel: Recent Labs  Lab 09/24/23 0338 09/25/23 0631 09/26/23 0317  NA 136 134* 133*  K 3.5 4.7 6.1*  CL 96* 96* 95*  CO2 24 20* 19*  GLUCOSE 107* 104* 82  BUN 43* 61* 73*  CREATININE 6.92* 8.25* 9.52*  CALCIUM 8.9 9.6 9.7  PHOS 5.7* 6.6* 8.4*   Liver Function Tests: Recent Labs  Lab 09/24/23 0338 09/25/23 0631 09/26/23 0317  ALBUMIN 3.2* 3.2* 3.1*   CBC: Recent Labs  Lab 09/21/23 2104 09/21/23 2215 09/22/23 1549 09/23/23 0511 09/26/23 0317  WBC 10.8*  --  7.3 6.9 6.6  HGB 11.2*   < > 9.1* 9.9* 9.9*  HCT 34.9*   < > 27.5* 28.7* 30.6*  MCV 104.5*  --  102.2* 100.7* 103.7*  PLT 254  --  186 184 162   < > = values in this interval not displayed.     Recent Labs  Lab 09/23/23 1738 09/24/23 0737  GLUCAP 128* 109*    Medications:  anticoagulant sodium citrate      apixaban  5 mg Oral BID   Chlorhexidine Gluconate Cloth  6 each Topical Q0600   cinacalcet  30 mg Oral Q M,W,F-HD   digoxin  0.125 mg Oral Q M,W,F-HD   levothyroxine  25 mcg Oral Q0600    midodrine  30 mg Oral TID WC   pregabalin  50 mg Oral Daily   sevelamer carbonate  1,600 mg Oral TID WC   venlafaxine XR  37.5 mg Oral Daily    Dialysis Orders: [facility] MWF 4.5 hrs 180NRe 400/800 155.5 kg 2.0 K/2.0 Ca TDC - no Heparin HIT + Flush TDC ports with citrate - Mircera 200 mcg IV q 2 weeks (last dose 08/24/2023) - Sensipar 30 mg PO three times per week - Venofer 100 mg IV X 10 ordered in January. Has gotten 4/10 doses Last Tsat 17% 09/15/2023   PATIENT DOES NOT ACCEPT BLOOD PRODUCTS.    Assessment/Plan: ESRD with HD, HFrEF, atrial fibrillation on Eliquis and digoxin, chronic respiratory failure 2/2 amiodarone toxicity, BMI 49, T2DM, and polyneuropathy returning to [facility] from SNF d/t toxic metabolic encephalopathy.   Toxic metabolic encephalopathy- Resolved. MS at baseline.  PAF - on digoxin per primary. Volume management with dialysis dialysis  Hyperkalemia- Should correct with HD today.  ESRD -  MWF.  HD today.  Hypotension - on midodrine 30mg  TID. Volume  -  3.5 -4L UF with HD. UF limited by hypotension  Anemia  - HGB 9.9. No recent ESA. Hold Fe load for now. Follow Hgb and start ESA if remains <10.  Chronic respiratory failure - 2L O2 at baseline  Metabolic bone disease -  Calcium in goal.  Phos variable.  Continue binders, sensipar and VDRA.  HFrEF - EF 30-35%   Nutrition - Low Albumin. Add protein supplements  Tomasa Blase PA-C [facility] 09/26/2023,10:45 AM

## 2023-09-26 NOTE — Progress Notes (Signed)
 Pt in unit upset with nurse due to bp being low unable to give pain medication protocol explained y nurse and NM pt continues to curse staff and raise his voice despite being told his behavior is unacceptable Dr Arlean Hopping made aware if behavior continues security will be called

## 2023-09-26 NOTE — Progress Notes (Signed)
Patient placed himself on home CPAP for the night.  

## 2023-09-26 NOTE — Progress Notes (Signed)
 HD#5 Subjective:   Summary: Phillip Young is a 38 y.o. male with multiple significant chronic conditions including ESRD with HD, HFrEF, atrial fibrillation on Eliquis and digoxin, chronic respiratory failure 2/2 amiodarone toxicity, BMI 49, T2DM, and polyneuropathy returning to [facility] from SNF d/t toxic metabolic encephalopathy.   Overnight Events: None  Seen in dialysis. Feeling around baseline. Does have some pain at in his gluteal region. H/x of hydradenitis. Discussed d/c once he has transportation set up. Inquires about motorized wheelchair, reached out to [facility], this is not something we can set up inpatient. 3  Objective:  Vital signs in last 24 hours: Vitals:   09/26/23 1000 09/26/23 1030 09/26/23 1100 09/26/23 1130  BP: 96/78 91/72 (!) 86/68 (!) 79/52  Pulse: (!) 112 88 (!) 108 (!) 52  Resp: 19 (!) 23 (!) 25 (!) 23  Temp:      TempSrc:      SpO2: 100% 100% 93% 100%  Weight:      Height:       Supplemental O2: Nasal Cannula SpO2: 100 % O2 Flow Rate (L/min): 5 L/min FiO2 (%): 40 %   Physical Exam:  Constitutional: Alert, NAD Cardiovascular: regular rate and rhythm, no m/r/g, TDC in place, no surrounding erythema, no TTP Abd: no TTP of the abdomen.  Pulmonary/Chest: normal work of breathing on 4L [place], anterior auscultation clear MSK: bilateral lower extremity edema. Skin: Firm subcutaneous nodule w/ nearby sinus tract w/o erythema, drainage, or purulence in the gluteal cleft. Mild TTP.    Filed Weights   09/21/23 2117 09/23/23 1016 09/26/23 0830  Weight: (!) 161 kg (!) 167 kg (!) 173 kg      Intake/Output Summary (Last 24 hours) at 09/26/2023 1146 Last data filed at 09/26/2023 0442 Gross per 24 hour  Intake 600 ml  Output 0 ml  Net 600 ml   Net IO Since Admission: -3,422 mL [09/26/23 1146]  Pertinent Labs:    Latest Ref Rng & Units 09/26/2023    3:17 AM 09/23/2023    5:11 AM 09/22/2023    3:49 PM  CBC  WBC 4.0 - 10.5 K/uL 6.6  6.9  7.3   Hemoglobin 13.0 - 17.0  g/dL 9.9  9.9  9.1   Hematocrit 39.0 - 52.0 % 30.6  28.7  27.5   Platelets 150 - 400 K/uL 162  184  186        Latest Ref Rng & Units 09/26/2023    3:17 AM 09/25/2023    6:31 AM 09/24/2023    3:38 AM  CMP  Glucose 70 - 99 mg/dL 82  161  096   BUN 6 - 20 mg/dL 73  61  43   Creatinine 0.61 - 1.24 mg/dL 0.45  4.09  8.11   Sodium 135 - 145 mmol/L 133  134  136   Potassium 3.5 - 5.1 mmol/L 6.1  4.7  3.5   Chloride 98 - 111 mmol/L 95  96  96   CO2 22 - 32 mmol/L 19  20  24    Calcium 8.9 - 10.3 mg/dL 9.7  9.6  8.9     Assessment/Plan:   Principal Problem:   Hyperkalemia Active Problems:   Acute encephalopathy  ESRD on HD Hyperkalemia Hypotension Anemia of CKD Nephrology onboard and appreciate their help. Has had trouble in past with liable pressures. Per Nephro - above EDW. Likely contributing to his a-fib - Appreciate nephrology's management - Continue midodrine 30 mg TID  - Continue Cinacalcet  30 mg non-dialysis days - Continue Renvela 1600 mg 3 times daily with meals - Daily CBC, RFP  Paroxysmal atrial fibrillation In and out of a-fib this admission. Rates improved today. Will continue digoxin and volume management with dialysis.  -Telemetry -continue eliquis 5 mg bid -continue digoxin 0.125 mg MWF  LLE coolness Lactic Acidosis - Resolved  Continued bilaterally coolness of the feet. Good pulses for me on exam today. Difficult to gauge changes in pain and sensation due to his severe longstanding neuropathy.  Plan: -Will order ABIs; VVS consult if indicated by results  Hidradenitis Suppurativa Some increased TTP and subcutaneous nodule of the gluteal left. No drainage, induration, fluctuance, erythema at this time. Will begin Abx ointment, continue to follow.  AoCHRF HFrEF [EF <20%]  Last EF 30-35% in October 2024 with moderately decreased LV function and global hypokinesis. Echo this admission with challenging views, however EF estimated at <20% with global hypokinesis.  GDMT limited due to hypotension. No crackles, endorses orthopnea - Continue with HD - Continue with Digoxin    Acute encephalopathy - Resolved Anion gap metabolic acidosis Mentation at baseline. AGMA persistent, felt 2-2 uremia. Last VBG WNL.    Chronic hypoxic respiratory failure History of presumed amiodarone toxicity Uses 2L [place] at home. He requires nightly BiPAP at home as well. - Keep O2 sats >92%  Chronic somatic and neuropathic pain Chronic opiate therapy Plan: -Continue tylenol 650 mg q6h PRN mild pain, fever -Continue venlafaxine 37.5 mg daily  -Continue lyrica 50 mg daily  -Continue oxycodone 10 mg q6h PRN severe pain    Hypothyroidism Last TSH at 4.9. - Continue synthyroid   Depression - Continue home Effexor.   Disposition Planning Functional deconditioning BMI 49 Will see if he is able to get SNF vs CIR vs D/c home. Will likely d/c home once transportation to dialysis is set up.  - Appreciate SW help - Continue to engage with PT  R great toe wound, c/f ingrown toenail Toenail avulsion with podiatry last admission - Wound care per orders  Diet: Renal IVF: None,None VTE: DOAC Code: Full PT/OT recs: CIR  Dispo: Anticipated discharge - location pending - in 1 days pending improvement of volume status, clinical stability, social work needs.   Lovie Macadamia MD Internal Medicine Resident PGY-1 Pager: [phone] Please contact the on call pager after 5 pm and on weekends at [phone].

## 2023-09-26 NOTE — TOC Progression Note (Addendum)
 Transition of Care [facility]) - Progression Note    Patient Details  Name: Phillip Young MRN: [id number] Date of Birth: 24-Jul-1986  [facility] CM/SW Contact  Tom-Johnson, Hershal Coria, RN Phone Number: 09/26/2023, 3:52 PM  Clinical Narrative:     CM called Medicaid Transportation to enroll patient for outpatient HD.   [phone]) CM spoke with April and was informed that patient's policy expired on 09/23/23 and needs to be assessed again. States patient's Case Worker is Dorathy Kinsman and she will contact patient to do an assessment. MD notified.   Patient informed CM that he is listed as Family instead of Single and his   Medicaid policy has to be changed. States his mother called and working on it.   CM will continue to follow.           Expected Discharge Plan and Services                                               Social Determinants of Health (SDOH) Interventions SDOH Screenings   Food Insecurity: No Food Insecurity (09/24/2023)  Housing: Low Risk  (09/24/2023)  Transportation Needs: Unmet Transportation Needs (09/24/2023)  Utilities: Not At Risk (09/24/2023)  Alcohol Screen: Low Risk  (09/21/2022)  Depression (PHQ2-9): High Risk (06/21/2023)  Financial Resource Strain: Low Risk  (03/24/2023)   Received from [facility]  Physical Activity: Inactive (09/21/2022)  Social Connections: Unknown (09/24/2023)  Stress: No Stress Concern Present (04/21/2023)   Received from [facility]  Tobacco Use: Medium Risk (09/21/2023)    Readmission Risk Interventions    09/23/2023    5:05 PM 07/05/2023    4:25 PM 05/24/2023    4:43 PM  Readmission Risk Prevention Plan  Transportation Screening Complete Complete Complete  PCP or Specialist Appt within 5-7 Days   Complete  Home Care Screening   Complete  Medication Review (RN CM)   Referral to Pharmacy  HRI or Home Care Consult  Complete   Social Work Consult for Recovery Care Planning/Counseling   Complete   Palliative Care Screening  Not Applicable   Medication Review [employer]) Referral to Pharmacy Complete   PCP or Specialist appointment within 3-5 days of discharge Complete    HRI or Home Care Consult Complete    SW Recovery Care/Counseling Consult Complete    Palliative Care Screening Not Applicable    Skilled Nursing Facility Not Applicable

## 2023-09-26 NOTE — Progress Notes (Signed)
 Received patient in bed to unit.  Alert and oriented.  Informed consent signed and in chart.   TX duration:4.5  Patient tolerated well.  Transported back to the room  Alert, without acute distress.  Hand-off given to patient's nurse.   Access used: RTDC Access issues: unable to achieve RX BFR - tx @350mL /hr for tx duration  Total UF removed: 3 Medication(s) given: post tx TDC citrate bocks   09/26/23 1322  Vitals  BP (!) 132/101  Pulse Rate (!) 31  ECG Heart Rate (!) 114  Resp 15  Oxygen Therapy  SpO2 (!) 83 %  During Treatment Monitoring  HD Safety Checks Performed Yes  Intra-Hemodialysis Comments Tx completed  Dialysis Fluid Bolus Normal Saline  Bolus Amount (mL) 300 mL  Post Treatment  Dialyzer Clearance Lightly streaked  Liters Processed 94  Fluid Removed (mL) 3000 mL  Tolerated HD Treatment Yes  Hemodialysis Catheter Right Internal jugular Double lumen Permanent (Tunneled)  Placement Date/Time: 05/30/23 0856   Serial / Lot #: [id number]  Expiration Date: 09/23/27  Time Out: Correct patient;Correct site;Correct procedure  Maximum sterile barrier precautions: Hand hygiene;Cap;Mask;Sterile gown;Sterile gloves;Large sterile s...  Site Condition No complications  Blue Lumen Status Flushed  Red Lumen Status Flushed  Catheter fill solution 4% Sodium Citrate  Catheter fill volume (Arterial) 1.9 cc  Catheter fill volume (Venous) 1.9  Dressing Type Transparent  Dressing Status Clean, Dry, Intact  Post treatment catheter status Capped and Clamped      Freddi Starr, RN Kidney Dialysis Unit

## 2023-09-26 NOTE — Consult Note (Signed)
 [facility] Liaison Consult Note    09/26/2023  Phillip Young 03-02-1986 [id number]  Follow up:  Came to patient room and patient speaking with inpatient [facility] RN.  Met with patient at the bedside and patient confirms that he no longer has Managed Medicaid however, he has gotten signed up for the wrong type of Medicaid.  He states [in a weak voice] that it's affecting his transportation and they're trying to get it straighten out.    Plan: Will update [facility] team of ongoing issues.  Charlesetta Shanks, RN, BSN, CCM [employer], [facility] Liaison Direct Dial: [phone] or secure chat Email: [email or web address]

## 2023-09-26 NOTE — Progress Notes (Signed)
 PT Cancellation Note  Patient Details Name: Phillip Young MRN: [id number] DOB: 27-Sep-1985   Cancelled Treatment:    Reason Eval/Treat Not Completed: Patient at procedure or test/unavailable. Pt currently in HD. Will check back as schedule allows to continue with PT POC.    Marylynn Pearson 09/26/2023, 12:16 PM  Conni Slipper, PT, DPT Acute Rehabilitation Services Secure Chat Preferred Office: [phone]

## 2023-09-27 ENCOUNTER — Inpatient Hospital Stay (HOSPITAL_COMMUNITY)

## 2023-09-27 DIAGNOSIS — I7090 Unspecified atherosclerosis: Secondary | ICD-10-CM | POA: Diagnosis not present

## 2023-09-27 DIAGNOSIS — E875 Hyperkalemia: Secondary | ICD-10-CM | POA: Diagnosis not present

## 2023-09-27 LAB — CBC
HCT: 30.7 % — ABNORMAL LOW (ref 39.0–52.0)
Hemoglobin: 10 g/dL — ABNORMAL LOW (ref 13.0–17.0)
MCH: 33.6 pg (ref 26.0–34.0)
MCHC: 32.6 g/dL (ref 30.0–36.0)
MCV: 103 fL — ABNORMAL HIGH (ref 80.0–100.0)
Platelets: 189 10*3/uL (ref 150–400)
RBC: 2.98 MIL/uL — ABNORMAL LOW (ref 4.22–5.81)
RDW: 17.5 % — ABNORMAL HIGH (ref 11.5–15.5)
WBC: 8.3 10*3/uL (ref 4.0–10.5)
nRBC: 0 % (ref 0.0–0.2)

## 2023-09-27 LAB — RENAL FUNCTION PANEL
Albumin: 3.1 g/dL — ABNORMAL LOW (ref 3.5–5.0)
Anion gap: 16 — ABNORMAL HIGH (ref 5–15)
BUN: 56 mg/dL — ABNORMAL HIGH (ref 6–20)
CO2: 20 mmol/L — ABNORMAL LOW (ref 22–32)
Calcium: 8.4 mg/dL — ABNORMAL LOW (ref 8.9–10.3)
Chloride: 96 mmol/L — ABNORMAL LOW (ref 98–111)
Creatinine, Ser: 7.33 mg/dL — ABNORMAL HIGH (ref 0.61–1.24)
GFR, Estimated: 9 mL/min — ABNORMAL LOW (ref >60)
Glucose, Bld: 90 mg/dL (ref 70–99)
Phosphorus: 6.5 mg/dL — ABNORMAL HIGH (ref 2.5–4.6)
Potassium: 4.4 mmol/L (ref 3.5–5.1)
Sodium: 132 mmol/L — ABNORMAL LOW (ref 135–145)

## 2023-09-27 LAB — VAS US ABI WITH/WO TBI
Left ABI: 1.14
Right ABI: 1.09

## 2023-09-27 MED ORDER — LIDOCAINE-PRILOCAINE 2.5-2.5 % EX CREA: 1.0000 | TOPICAL_CREAM | CUTANEOUS | Status: DC | PRN

## 2023-09-27 MED ORDER — ANTICOAGULANT SODIUM CITRATE 4% (200MG/5ML) IV SOLN
5.0000 mL | Status: DC | PRN
  Administered 2023-09-28: 3.8 mL
  Filled 2023-09-27: qty 5

## 2023-09-27 MED ORDER — BACITRACIN-NEOMYCIN-POLYMYXIN OINTMENT TUBE
TOPICAL_OINTMENT | Freq: Every day | CUTANEOUS | Status: DC
  Filled 2023-09-27: qty 14

## 2023-09-27 MED ORDER — ALTEPLASE 2 MG IJ SOLR: 2.0000 mg | Freq: Once | INTRAMUSCULAR | Status: DC | PRN

## 2023-09-27 MED ORDER — PENTAFLUOROPROP-TETRAFLUOROETH EX AERO: 1.0000 | INHALATION_SPRAY | CUTANEOUS | Status: DC | PRN

## 2023-09-27 MED ORDER — LIDOCAINE HCL (PF) 1 % IJ SOLN
5.0000 mL | INTRAMUSCULAR | Status: DC | PRN
Start: 2023-09-27 — End: 2023-09-27

## 2023-09-27 MED ORDER — LIDOCAINE HCL (PF) 1 % IJ SOLN: 5.0000 mL | INTRAMUSCULAR | Status: DC | PRN

## 2023-09-27 NOTE — Progress Notes (Signed)
 HD#6 Subjective:   Summary: Phillip Young is a 38 y.o. male with multiple significant chronic conditions including ESRD with HD, HFrEF, atrial fibrillation on Eliquis and digoxin, chronic respiratory failure 2/2 amiodarone toxicity, BMI 49, T2DM, and polyneuropathy returning to [facility] from SNF d/t toxic metabolic encephalopathy.   Overnight Events: None  Feeling well today. No new symptoms. Gluteal lesions stable. Waiting for insurance to be switched to set up transport. This has been an issue for the patient in the past.   Objective:  Vital signs in last 24 hours: Vitals:   09/26/23 2030 09/27/23 0027 09/27/23 0500 09/27/23 0813  BP: 105/73 113/85 114/76 (!) 83/71  Pulse: (!) 53 62 (!) 101 (!) 114  Resp: 18 19    Temp: 98.3 F (36.8 C) 98.6 F (37 C) 98.2 F (36.8 C)   TempSrc:  Oral Oral   SpO2: 100% 100% 100% 100%  Weight:      Height:       Supplemental O2: Nasal Cannula SpO2: 100 % O2 Flow Rate (L/min): 5 L/min FiO2 (%): 40 %   Physical Exam:  Constitutional: Alert, NAD Cardiovascular: regular rate and rhythm, no m/r/g, TDC in place, no surrounding erythema, no TTP Abd: no TTP of the abdomen.  Pulmonary/Chest: normal work of breathing. Lungs clear to auscultation bilaterally.  Psych: Pleasant affect.   Filed Weights   09/23/23 1016 09/26/23 0830 09/26/23 1325  Weight: (!) 167 kg (!) 173 kg (!) 170 kg      Intake/Output Summary (Last 24 hours) at 09/27/2023 1146 Last data filed at 09/26/2023 1700 Gross per 24 hour  Intake 360 ml  Output 3000 ml  Net -2640 ml   Net IO Since Admission: -6,062 mL [09/27/23 1146]  Pertinent Labs:    Latest Ref Rng & Units 09/26/2023    3:17 AM 09/23/2023    5:11 AM 09/22/2023    3:49 PM  CBC  WBC 4.0 - 10.5 K/uL 6.6  6.9  7.3   Hemoglobin 13.0 - 17.0 g/dL 9.9  9.9  9.1   Hematocrit 39.0 - 52.0 % 30.6  28.7  27.5   Platelets 150 - 400 K/uL 162  184  186        Latest Ref Rng & Units 09/27/2023    3:27 AM 09/26/2023    3:17  AM 09/25/2023    6:31 AM  CMP  Glucose 70 - 99 mg/dL 90  82  161   BUN 6 - 20 mg/dL 56  73  61   Creatinine 0.61 - 1.24 mg/dL 0.96  0.45  4.09   Sodium 135 - 145 mmol/L 132  133  134   Potassium 3.5 - 5.1 mmol/L 4.4  6.1  4.7   Chloride 98 - 111 mmol/L 96  95  96   CO2 22 - 32 mmol/L 20  19  20    Calcium 8.9 - 10.3 mg/dL 8.4  9.7  9.6     Assessment/Plan:   Principal Problem:   Hyperkalemia Active Problems:   Acute encephalopathy  ESRD on HD Hyperkalemia Hypotension Anemia of CKD Nephrology onboard and appreciate their help. Has had trouble in past with liable pressures. Per Nephro - above EDW. Likely contributing to his a-fib. Hypotensive after dialysis yesterday. Will ask nursing to repeat BP. If still low, patient willing to take midodrine. Otherwise has been declining midodrine.  - Appreciate nephrology's management - Continue midodrine 30 mg TID  - Continue Cinacalcet 30 mg non-dialysis days - Continue  Renvela 1600 mg 3 times daily with meals - Daily CBC, RFP  Paroxysmal atrial fibrillation In and out of a-fib this admission. Rates improved. Will continue digoxin and volume management with dialysis.  -Telemetry -continue eliquis 5 mg bid -continue digoxin 0.125 mg MWF  LLE coolness Lactic Acidosis - Resolved  Continued bilaterally coolness of the feet. Difficult to gauge changes in pain and sensation due to his severe longstanding neuropathy.  Plan: - ABI's completed, pending read - VVS consult if indicated by results  Hidradenitis Suppurativa Some increased TTP and subcutaneous nodule of the gluteal left. No drainage, induration, fluctuance, erythema at this time. Will begin Abx ointment, continue to follow.  AoCHRF HFrEF [EF <20%]  Last EF 30-35% in October 2024 with moderately decreased LV function and global hypokinesis. Echo this admission with challenging views, however EF estimated at <20% with global hypokinesis. GDMT limited due to hypotension. No  crackles, endorses orthopnea - Continue with HD - Continue with Digoxin    Acute encephalopathy - Resolved Anion gap metabolic acidosis Mentation at baseline. AGMA persistent, felt 2-2 uremia. Last VBG WNL.    Chronic hypoxic respiratory failure History of presumed amiodarone toxicity Uses 2L [place] at home. He requires nightly BiPAP at home as well. - Keep O2 sats >92%  Chronic somatic and neuropathic pain Chronic opiate therapy Plan: -Continue tylenol 650 mg q6h PRN mild pain, fever -Continue venlafaxine 37.5 mg daily  -Continue lyrica 50 mg daily  -Continue oxycodone 10 mg q6h PRN severe pain    Hypothyroidism Last TSH at 4.9. - Continue synthyroid   Depression - Continue home Effexor.   Disposition Planning Functional deconditioning BMI 49 Will see if he is able to get SNF vs CIR vs D/c home. Will likely d/c home once transportation to dialysis is set up.  - Appreciate SW help - Continue to engage with PT  R great toe wound, c/f ingrown toenail Toenail avulsion with podiatry last admission - Wound care per orders  Diet: Renal IVF: None,None VTE: DOAC Code: Full PT/OT recs: CIR  Dispo: Anticipated discharge - location pending - in 3 days pending social work needs.   Lovie Macadamia MD Internal Medicine Resident PGY-1 Pager: [phone] Please contact the on call pager after 5 pm and on weekends at [phone].

## 2023-09-27 NOTE — Progress Notes (Signed)
 [facility] Progress Note   Subjective:    Seen and examined at bedside. Tolerated 3L UF with HD yesterday. No new complaints   Objective Vitals:   09/26/23 2030 09/27/23 0027 09/27/23 0500 09/27/23 0813  BP: 105/73 113/85 114/76 (!) 83/71  Pulse: (!) 53 62 (!) 101 (!) 114  Resp: 18 19    Temp: 98.3 F (36.8 C) 98.6 F (37 C) 98.2 F (36.8 C)   TempSrc:  Oral Oral   SpO2: 100% 100% 100% 100%  Weight:      Height:       Physical Exam General:chronically ill appearing, on 3L [place]  Heart: Tachy Lungs:Clear  Abdomen:NTND, soft Extremities: trace LE edema; no open wounds, lesions  Dialysis Access: [facility]   Filed Weights   09/23/23 1016 09/26/23 0830 09/26/23 1325  Weight: (!) 167 kg (!) 173 kg (!) 170 kg    Intake/Output Summary (Last 24 hours) at 09/27/2023 1015 Last data filed at 09/26/2023 1700 Gross per 24 hour  Intake 360 ml  Output 3000 ml  Net -2640 ml    Additional Objective Labs: Basic Metabolic Panel: Recent Labs  Lab 09/25/23 0631 09/26/23 0317 09/27/23 0327  NA 134* 133* 132*  K 4.7 6.1* 4.4  CL 96* 95* 96*  CO2 20* 19* 20*  GLUCOSE 104* 82 90  BUN 61* 73* 56*  CREATININE 8.25* 9.52* 7.33*  CALCIUM 9.6 9.7 8.4*  PHOS 6.6* 8.4* 6.5*   Liver Function Tests: Recent Labs  Lab 09/25/23 0631 09/26/23 0317 09/27/23 0327  ALBUMIN 3.2* 3.1* 3.1*   CBC: Recent Labs  Lab 09/21/23 2104 09/21/23 2215 09/22/23 1549 09/23/23 0511 09/26/23 0317  WBC 10.8*  --  7.3 6.9 6.6  HGB 11.2*   < > 9.1* 9.9* 9.9*  HCT 34.9*   < > 27.5* 28.7* 30.6*  MCV 104.5*  --  102.2* 100.7* 103.7*  PLT 254  --  186 184 162   < > = values in this interval not displayed.     Recent Labs  Lab 09/23/23 1738 09/24/23 0737  GLUCAP 128* 109*    Medications:    apixaban  5 mg Oral BID   Chlorhexidine Gluconate Cloth  6 each Topical Q0600   cinacalcet  30 mg Oral Q M,W,F-HD   clindamycin   Topical BID   digoxin  0.125 mg Oral Q M,W,F-HD    levothyroxine  25 mcg Oral Q0600   midodrine  30 mg Oral TID WC   pregabalin  50 mg Oral Daily   sevelamer carbonate  1,600 mg Oral TID WC   venlafaxine XR  37.5 mg Oral Daily    Dialysis Orders: [facility] MWF 4.5 hrs 180NRe 400/800 155.5 kg 2.0 K/2.0 Ca TDC - no Heparin HIT + Flush TDC ports with citrate - Mircera 200 mcg IV q 2 weeks (last dose 08/24/2023) - Sensipar 30 mg PO three times per week - Venofer 100 mg IV X 10 ordered in January. Has gotten 4/10 doses Last Tsat 17% 09/15/2023   PATIENT DOES NOT ACCEPT BLOOD PRODUCTS.    Assessment/Plan: ESRD with HD, HFrEF, atrial fibrillation on Eliquis and digoxin, chronic respiratory failure 2/2 amiodarone toxicity, BMI 49, T2DM, and polyneuropathy returning to [facility] from SNF d/t toxic metabolic encephalopathy.   Toxic metabolic encephalopathy- Resolved. MS at baseline.  PAF - on digoxin per primary. Volume management with dialysis  Hyperkalemia- Resolved with HD.  ESRD -  MWF.  Continue on schedule   Hypotension - on midodrine  30mg  TID. Volume  - 3.5 -4L UF with HD. UF limited by hypotension  Anemia  - HGB 9.9. No recent ESA. Hold Fe load for now. Follow Hgb and start ESA if remains <10.  Chronic respiratory failure - 2L O2 at baseline  Metabolic bone disease -  Calcium in goal.  Phos variable.  Continue binders, sensipar and VDRA.  HFrEF - EF 30-35%   Nutrition - Low Albumin. Add protein supplements  Tomasa Blase PA-C [facility] 09/27/2023,10:15 AM

## 2023-09-27 NOTE — Plan of Care (Signed)
   Problem: Education: Goal: Knowledge of General Education information will improve Description: Including pain rating scale, medication(s)/side effects and non-pharmacologic comfort measures Outcome: Completed/Met

## 2023-09-27 NOTE — Plan of Care (Signed)
  Problem: Education: Goal: Knowledge of General Education information will improve Description: Including pain rating scale, medication(s)/side effects and non-pharmacologic comfort measures Outcome: Progressing   Problem: Health Behavior/Discharge Planning: Goal: Ability to manage health-related needs will improve Outcome: Progressing   Problem: Clinical Measurements: Goal: Ability to maintain clinical measurements within normal limits will improve Outcome: Progressing Goal: Will remain free from infection Outcome: Progressing Goal: Diagnostic test results will improve Outcome: Progressing Goal: Respiratory complications will improve Outcome: Progressing Goal: Cardiovascular complication will be avoided Outcome: Progressing   Problem: Activity: Goal: Risk for activity intolerance will decrease Outcome: Progressing   Problem: Nutrition: Goal: Adequate nutrition will be maintained Outcome: Progressing   Problem: Coping: Goal: Level of anxiety will decrease Outcome: Progressing   Problem: Elimination: Goal: Will not experience complications related to bowel motility Outcome: Progressing Goal: Will not experience complications related to urinary retention Outcome: Progressing   Problem: Pain Managment: Goal: General experience of comfort will improve and/or be controlled Outcome: Progressing   Problem: Safety: Goal: Ability to remain free from injury will improve Outcome: Progressing   Problem: Skin Integrity: Goal: Risk for impaired skin integrity will decrease Outcome: Progressing   Problem: Education: Goal: Knowledge of disease and its progression will improve Outcome: Progressing Goal: Individualized Educational Video(s) Outcome: Progressing   Problem: Fluid Volume: Goal: Compliance with measures to maintain balanced fluid volume will improve Outcome: Progressing   Problem: Health Behavior/Discharge Planning: Goal: Ability to manage health-related needs  will improve Outcome: Progressing   Problem: Nutritional: Goal: Ability to make healthy dietary choices will improve Outcome: Progressing   Problem: Clinical Measurements: Goal: Complications related to the disease process, condition or treatment will be avoided or minimized Outcome: Progressing

## 2023-09-27 NOTE — Progress Notes (Signed)
 Physical Therapy Treatment Patient Details Name: Phillip Young MRN: [id number] DOB: [birth year] Today's Date: 09/27/2023   History of Present Illness Pt is a 38 y/o M admitted on 09/21/23 after presenting with AMS. Pt just d/c from hospital on 2/17 for tx of a-fib with RVR & pt underwent hemodialysis. Pt is being treated for hyperkalemia, acute toxic/metabolic encephalopathy. PMH: chronic respiratory failure, CHF, HTN, nonischemic cardiomyopathy, a-flutter, obesity, prediabetes    PT Comments  Pt received EOB and reports BLE pain and buttocks pain. Bariatric rollator brought to advance mobility with pt. He was sleepy and needed frequent stimulation or would start to fall back asleep. Pt had easier time standing from firm surface of rollator than he does from bed. Ambulated 8' 5x with seated rest breaks in between bouts and LE exercise while seated. Pt encouraged to be able to make it to hallway. SPO2 95% on 6L, HR 111 bpm. Patient will benefit from intensive inpatient follow-up therapy, >3 hours/day. PT will continue to follow.     If plan is discharge home, recommend the following: A little help with walking and/or transfers;A little help with bathing/dressing/bathroom;Assistance with cooking/housework;Assist for transportation;Help with stairs or ramp for entrance   Can travel by private vehicle     Yes  Equipment Recommendations  Rollator (4 wheels);Other (comment) (bariatric rollator and power chair)    Recommendations for Other Services Rehab consult     Precautions / Restrictions Precautions Precautions: Fall Recall of Precautions/Restrictions: Intact Precaution/Restrictions Comments: Watch BP and HR Restrictions Weight Bearing Restrictions Per Provider Order: No     Mobility  Bed Mobility               General bed mobility comments: pt EOB    Transfers Overall transfer level: Needs assistance Equipment used: Rollator (4 wheels) (bari) Transfers: Sit to/from  Stand Sit to Stand: Supervision, From elevated surface   Step pivot transfers: Min assist       General transfer comment: pt does better standing from firm surface of rollator which is actually a bit lower than he requests the bed to be before standing    Ambulation/Gait Ambulation/Gait assistance: Contact guard assist Gait Distance (Feet): 40 Feet (8' x5) Assistive device: Rollator (4 wheels) (bari) Gait Pattern/deviations: Step-through pattern, Decreased dorsiflexion - right, Wide base of support Gait velocity: decreased Gait velocity interpretation: <1.31 ft/sec, indicative of household ambulator   General Gait Details: pt with R foot drop. Tolerated about 8' at a time before needing to sit but was motivated to perform multiple bouts. During rest breaks had pt push self bkwds on rollator using quads and pull self fwd on rollator with core and hamstrings, 3 pushes each way with each break   Stairs             Wheelchair Mobility     Tilt Bed    Modified Rankin (Stroke Patients Only)       Balance Overall balance assessment: Needs assistance Sitting-balance support: No upper extremity supported, Single extremity supported, Feet supported Sitting balance-Leahy Scale: Good Sitting balance - Comments: able to sit EOB without LOB   Standing balance support: Bilateral upper extremity supported, During functional activity, Reliant on assistive device for balance Standing balance-Leahy Scale: Fair Standing balance comment: Pt able to let go of RW and maintain balance with supervision/CGA                            Communication Communication Communication: No apparent  difficulties  Cognition Arousal: Lethargic (needed freq arousal) Behavior During Therapy: WFL for tasks assessed/performed   PT - Cognitive impairments: No apparent impairments                         Following commands: Intact      Cueing Cueing Techniques: Verbal cues,  Visual cues  Exercises      General Comments General comments (skin integrity, edema, etc.): HR 111 bpm in standing, BP stable. SPO2 95% on 6L O2      Pertinent Vitals/Pain Pain Assessment Pain Assessment: Faces Faces Pain Scale: Hurts even more Pain Location: BLE pain and buttocks Pain Descriptors / Indicators: Grimacing, Guarding Pain Intervention(s): Limited activity within patient's tolerance, Monitored during session    Home Living                          Prior Function            PT Goals (current goals can now be found in the care plan section) Acute Rehab PT Goals Patient Stated Goal: get better, go home PT Goal Formulation: With patient Time For Goal Achievement: 10/07/23 Potential to Achieve Goals: Fair Progress towards PT goals: Progressing toward goals    Frequency    Min 1X/week      PT Plan      Co-evaluation              AM-PAC PT "6 Clicks" Mobility   Outcome Measure  Help needed turning from your back to your side while in a flat bed without using bedrails?: A Little Help needed moving from lying on your back to sitting on the side of a flat bed without using bedrails?: A Little Help needed moving to and from a bed to a chair (including a wheelchair)?: A Little Help needed standing up from a chair using your arms (e.g., wheelchair or bedside chair)?: A Lot Help needed to walk in hospital room?: A Little Help needed climbing 3-5 steps with a railing? : Total 6 Click Score: 15    End of Session Equipment Utilized During Treatment: Oxygen Activity Tolerance: Patient tolerated treatment well;Patient limited by fatigue Patient left: in bed;with call bell/phone within reach Nurse Communication: Mobility status (O2) PT Visit Diagnosis: Unsteadiness on feet (R26.81);Other abnormalities of gait and mobility (R26.89);Muscle weakness (generalized) (M62.81);Difficulty in walking, not elsewhere classified (R26.2);Pain Pain - Right/Left:   (bilateral) Pain - part of body: Leg     Time: 5409-8119 PT Time Calculation (min) (ACUTE ONLY): 52 min  Charges:    $Gait Training: 23-37 mins $Therapeutic Exercise: 8-22 mins PT General Charges $$ ACUTE PT VISIT: 1 Visit                     Lyanne Co, PT  Acute Rehab Services Secure chat preferred Office [phone]    Lawana Chambers [place] 09/27/2023, 5:08 PM

## 2023-09-27 NOTE — TOC Progression Note (Signed)
 Transition of Care [facility]) - Progression Note    Patient Details  Name: Phillip Young MRN: [id number] Date of Birth: 07/21/1986  [facility] CM/SW Contact  Tom-Johnson, Hershal Coria, RN Phone Number: 09/27/2023, 2:51 PM  Clinical Narrative:     CM informed by patient's mother [name] that patient was given the wrong Medicaid policy therefore he is not eligible for transportation.  CM called Medicaid Transportation and spoke with [facility]. States patient was assigned [employer] and needs to be changed to Full Adult Medicaid. States patient's Case worker will call him to assess for the right Medicaid to meet his needs.  MD notify.  CM will continue to follow as patient progresses towards discharge.                 Expected Discharge Plan and Services                                               Social Determinants of Health (SDOH) Interventions SDOH Screenings   Food Insecurity: No Food Insecurity (09/24/2023)  Housing: Low Risk  (09/24/2023)  Transportation Needs: Unmet Transportation Needs (09/24/2023)  Utilities: Not At Risk (09/24/2023)  Alcohol Screen: Low Risk  (09/21/2022)  Depression (PHQ2-9): High Risk (06/21/2023)  Financial Resource Strain: Low Risk  (03/24/2023)   Received from [facility]  Physical Activity: Inactive (09/21/2022)  Social Connections: Unknown (09/24/2023)  Stress: No Stress Concern Present (04/21/2023)   Received from [facility]  Tobacco Use: Medium Risk (09/21/2023)    Readmission Risk Interventions    09/23/2023    5:05 PM 07/05/2023    4:25 PM 05/24/2023    4:43 PM  Readmission Risk Prevention Plan  Transportation Screening Complete Complete Complete  PCP or Specialist Appt within 5-7 Days   Complete  Home Care Screening   Complete  Medication Review (RN CM)   Referral to Pharmacy  HRI or Home Care Consult  Complete   Social Work Consult for Recovery Care Planning/Counseling  Complete    Palliative Care Screening  Not Applicable   Medication Review [facility]) Referral to Pharmacy Complete   PCP or Specialist appointment within 3-5 days of discharge Complete    HRI or Home Care Consult Complete    SW Recovery Care/Counseling Consult Complete    Palliative Care Screening Not Applicable    Skilled Nursing Facility Not Applicable

## 2023-09-27 NOTE — Progress Notes (Signed)
 OT Cancellation Note  Patient Details Name: [name] MRN: [id number] DOB: 02-07-1986   Cancelled Treatment:    Reason Eval/Treat Not Completed: (P) Fatigue/lethargy limiting ability to participate, Pt in/out of sleep several times when trying to initiate therapy, c/o significant pain to BLEs, will check back as able.   Alexis Goodell 09/27/2023, 2:42 PM

## 2023-09-27 NOTE — Progress Notes (Signed)
 MEWS Progress Note  Patient Details Name: Zakariye Nee MRN: [id number] DOB: 27-Aug-1985 Today's Date: 09/27/2023   MEWS Flowsheet Documentation:  Assess: MEWS Score Temp: 98.2 F (36.8 C) BP: (!) 83/71 MAP (mmHg): 77 Pulse Rate: (!) 114 ECG Heart Rate: (!) 114 Resp: 19 Level of Consciousness: Alert SpO2: 100 % O2 Device: Nasal Cannula Patient Activity (if Appropriate): In bed O2 Flow Rate (L/min): 5 L/min FiO2 (%): 40 % Assess: MEWS Score MEWS Temp: 0 MEWS Systolic: 1 MEWS Pulse: 2 MEWS RR: 0 MEWS LOC: 0 MEWS Score: 3 MEWS Score Color: Yellow Assess: SIRS CRITERIA SIRS Temperature : 0 SIRS Respirations : 0 SIRS Pulse: 1 SIRS WBC: 0 SIRS Score Sum : 1 Assess: if the MEWS score is Yellow or Red Were vital signs accurate and taken at a resting state?: Yes Does the patient meet 2 or more of the SIRS criteria?: No MEWS guidelines implemented : Yes, yellow Treat MEWS Interventions: Considered administering scheduled or prn medications/treatments as ordered Take Vital Signs Increase Vital Sign Frequency : Yellow: Q2hr x1, continue Q4hrs until patient remains green for 12hrs Escalate MEWS: Escalate: Yellow: Discuss with charge nurse and consider notifying provider and/or RRT        Pauletta Browns 09/27/2023, 9:28 AM

## 2023-09-27 NOTE — Care Management Important Message (Signed)
 Important Message  Patient Details  Name: Phillip Young MRN: [id number] Date of Birth: 20-Oct-1985   Important Message Given:  Yes - Medicare IM     Dorena Bodo 09/27/2023, 3:12 PM

## 2023-09-28 ENCOUNTER — Encounter (HOSPITAL_COMMUNITY): Payer: Medicare Other

## 2023-09-28 DIAGNOSIS — E875 Hyperkalemia: Secondary | ICD-10-CM | POA: Diagnosis not present

## 2023-09-28 LAB — CBC
HCT: 30.2 % — ABNORMAL LOW (ref 39.0–52.0)
Hemoglobin: 10.1 g/dL — ABNORMAL LOW (ref 13.0–17.0)
MCH: 33.9 pg (ref 26.0–34.0)
MCHC: 33.4 g/dL (ref 30.0–36.0)
MCV: 101.3 fL — ABNORMAL HIGH (ref 80.0–100.0)
Platelets: 186 10*3/uL (ref 150–400)
RBC: 2.98 MIL/uL — ABNORMAL LOW (ref 4.22–5.81)
RDW: 17.2 % — ABNORMAL HIGH (ref 11.5–15.5)
WBC: 7.3 10*3/uL (ref 4.0–10.5)
nRBC: 0 % (ref 0.0–0.2)

## 2023-09-28 LAB — RENAL FUNCTION PANEL
Albumin: 3.4 g/dL — ABNORMAL LOW (ref 3.5–5.0)
Anion gap: 20 — ABNORMAL HIGH (ref 5–15)
BUN: 77 mg/dL — ABNORMAL HIGH (ref 6–20)
CO2: 21 mmol/L — ABNORMAL LOW (ref 22–32)
Calcium: 9.3 mg/dL (ref 8.9–10.3)
Chloride: 93 mmol/L — ABNORMAL LOW (ref 98–111)
Creatinine, Ser: 8.77 mg/dL — ABNORMAL HIGH (ref 0.61–1.24)
GFR, Estimated: 7 mL/min — ABNORMAL LOW (ref >60)
Glucose, Bld: 87 mg/dL (ref 70–99)
Phosphorus: 8.8 mg/dL — ABNORMAL HIGH (ref 2.5–4.6)
Potassium: 5.5 mmol/L — ABNORMAL HIGH (ref 3.5–5.1)
Sodium: 134 mmol/L — ABNORMAL LOW (ref 135–145)

## 2023-09-28 LAB — MAGNESIUM: Magnesium: 2.3 mg/dL (ref 1.7–2.4)

## 2023-09-28 MED ORDER — ANTICOAGULANT SODIUM CITRATE 4% (200MG/5ML) IV SOLN: 5.0000 mL | Status: DC | PRN

## 2023-09-28 MED ORDER — ATORVASTATIN CALCIUM 10 MG PO TABS
20.0000 mg | ORAL_TABLET | Freq: Every day | ORAL | Status: DC
  Filled 2023-09-28: qty 2

## 2023-09-28 MED ORDER — ALTEPLASE 2 MG IJ SOLR: 2.0000 mg | Freq: Once | INTRAMUSCULAR | Status: DC | PRN

## 2023-09-28 MED ORDER — PENTAFLUOROPROP-TETRAFLUOROETH EX AERO: 1.0000 | INHALATION_SPRAY | CUTANEOUS | Status: DC | PRN

## 2023-09-28 MED ORDER — LIDOCAINE HCL (PF) 1 % IJ SOLN: 5.0000 mL | INTRAMUSCULAR | Status: DC | PRN

## 2023-09-28 MED ORDER — LIDOCAINE-PRILOCAINE 2.5-2.5 % EX CREA: 1.0000 | TOPICAL_CREAM | CUTANEOUS | Status: DC | PRN

## 2023-09-28 NOTE — Plan of Care (Signed)
  Problem: Clinical Measurements: Goal: Diagnostic test results will improve Outcome: Completed/Met

## 2023-09-28 NOTE — Plan of Care (Signed)
  Problem: Health Behavior/Discharge Planning: Goal: Ability to manage health-related needs will improve Outcome: Progressing   Problem: Clinical Measurements: Goal: Ability to maintain clinical measurements within normal limits will improve Outcome: Progressing Goal: Will remain free from infection Outcome: Progressing Goal: Diagnostic test results will improve Outcome: Progressing Goal: Respiratory complications will improve Outcome: Progressing Goal: Cardiovascular complication will be avoided Outcome: Progressing   Problem: Activity: Goal: Risk for activity intolerance will decrease Outcome: Progressing   Problem: Nutrition: Goal: Adequate nutrition will be maintained Outcome: Progressing   Problem: Coping: Goal: Level of anxiety will decrease Outcome: Progressing   Problem: Elimination: Goal: Will not experience complications related to bowel motility Outcome: Progressing Goal: Will not experience complications related to urinary retention Outcome: Progressing   Problem: Pain Managment: Goal: General experience of comfort will improve and/or be controlled Outcome: Progressing   Problem: Safety: Goal: Ability to remain free from injury will improve Outcome: Progressing   Problem: Skin Integrity: Goal: Risk for impaired skin integrity will decrease Outcome: Progressing   Problem: Education: Goal: Knowledge of disease and its progression will improve Outcome: Progressing Goal: Individualized Educational Video(s) Outcome: Progressing   Problem: Fluid Volume: Goal: Compliance with measures to maintain balanced fluid volume will improve Outcome: Progressing   Problem: Health Behavior/Discharge Planning: Goal: Ability to manage health-related needs will improve Outcome: Progressing   Problem: Nutritional: Goal: Ability to make healthy dietary choices will improve Outcome: Progressing   Problem: Clinical Measurements: Goal: Complications related to the  disease process, condition or treatment will be avoided or minimized Outcome: Progressing

## 2023-09-28 NOTE — Progress Notes (Addendum)
 HD#7 Subjective:   Summary: Phillip Young is a 38 y.o. male with multiple significant chronic conditions including ESRD with HD, HFrEF, atrial fibrillation on Eliquis and digoxin, chronic respiratory failure 2/2 amiodarone toxicity, BMI 49, T2DM, and polyneuropathy returning to [facility] from SNF d/t toxic metabolic encephalopathy.   Overnight Events: None  Seen in dialysis today, patient says he is doing well.  Did not take his midodrine this morning.  Blood pressure looks improved this a.m.  Playing mobile chess during dialysis, [name] was playing white, E4 into Pirc defence, patient in winning position by end of interview. States he is doing well, no new issues this admission. Waiting for insurance / transportation to be set up for dialysis prior to d/c. Spoke with social work regarding this, and they are working hard to get this set up. In good spirits this morning.  Objective:  Vital signs in last 24 hours: Vitals:   09/28/23 0930 09/28/23 1000 09/28/23 1030 09/28/23 1130  BP: 119/75 101/76 138/89 102/74  Pulse: (!) 133 (!) 25 (!) 57 94  Resp: (!) 24 (!) 23 (!) 23 20  Temp:      TempSrc:      SpO2: 100% (!) 73% 97% 100%  Weight:      Height:       Supplemental O2: Nasal Cannula SpO2: 100 % O2 Flow Rate (L/min): 5 L/min FiO2 (%): 40 %   Physical Exam:  Constitutional: Alert, NAD Cardiovascular: regular rate and rhythm, no m/r/g, TDC accessed  Abd: no TTP of the abdomen.  Pulmonary/Chest: normal work of breathing. Lungs clear to auscultation bilaterally.  Psych: Pleasant affect.   Filed Weights   09/26/23 0830 09/26/23 1325 09/28/23 0752  Weight: (!) 173 kg (!) 170 kg (!) 177 kg      Intake/Output Summary (Last 24 hours) at 09/28/2023 1156 Last data filed at 09/28/2023 0600 Gross per 24 hour  Intake 720 ml  Output 0 ml  Net 720 ml   Net IO Since Admission: -4,982 mL [09/28/23 1156]  Pertinent Labs:    Latest Ref Rng & Units 09/28/2023    3:22 AM 09/27/2023    7:53  PM 09/26/2023    3:17 AM  CBC  WBC 4.0 - 10.5 K/uL 7.3  8.3  6.6   Hemoglobin 13.0 - 17.0 g/dL 78.2  95.6  9.9   Hematocrit 39.0 - 52.0 % 30.2  30.7  30.6   Platelets 150 - 400 K/uL 186  189  162        Latest Ref Rng & Units 09/28/2023    3:22 AM 09/27/2023    3:27 AM 09/26/2023    3:17 AM  CMP  Glucose 70 - 99 mg/dL 87  90  82   BUN 6 - 20 mg/dL 77  56  73   Creatinine 0.61 - 1.24 mg/dL 2.13  0.86  5.78   Sodium 135 - 145 mmol/L 134  132  133   Potassium 3.5 - 5.1 mmol/L 5.5  4.4  6.1   Chloride 98 - 111 mmol/L 93  96  95   CO2 22 - 32 mmol/L 21  20  19    Calcium 8.9 - 10.3 mg/dL 9.3  8.4  9.7     Assessment/Plan:   Principal Problem:   Hyperkalemia Active Problems:   Acute encephalopathy  ESRD on HD Hyperkalemia Hypotension Anemia of CKD Nephrology onboard and appreciate their help. Has had trouble in past with liable pressures. Per Nephro -  above EDW. Likely contributing to his a-fib. - Appreciate nephrology's management - Continue midodrine 30 mg TID, patient has a tendency to refuse this medication - Continue Cinacalcet 30 mg non-dialysis days - Continue Renvela 1600 mg 3 times daily with meals - Daily CBC, RFP  Paroxysmal atrial fibrillation In and out of a-fib this admission. Rates improved. Will continue digoxin and volume management with dialysis.  -continue eliquis 5 mg bid -continue digoxin 0.125 mg MWF  Abnormal left toe-brachial index with normal ABI Lactic Acidosis - Resolved  Suspect distal PAD affecting left toe. Continues to have bilateral coolness of the feet 2/2 severe longstanding neuropathy.  Plan: - He can follow up with his outpatient vascular surgeon for this after discharge   - Lipitor 20mg    Hidradenitis Suppurativa Will begin Abx ointment, continue to follow.  AoCHRF HFrEF [EF <20%]  Last EF 30-35% in October 2024 with moderately decreased LV function and global hypokinesis. Echo this admission with challenging views, however EF  estimated at <20% with global hypokinesis. GDMT limited due to hypotension. No crackles, endorses orthopnea - Continue with HD - Continue with Digoxin    Acute encephalopathy - Resolved Anion gap metabolic acidosis Mentation at baseline. AGMA persistent, felt 2-2 uremia.  Chronic hypoxic respiratory failure History of presumed amiodarone toxicity Uses 2L [place] at home. He requires nightly BiPAP at home as well. - Keep O2 sats >92%  Chronic somatic and neuropathic pain Chronic opiate therapy Plan: -Continue tylenol 650 mg q6h PRN mild pain, fever -Continue venlafaxine 37.5 mg daily  -Continue lyrica 50 mg daily  -Continue oxycodone 10 mg q6h PRN severe pain    Hypothyroidism Last TSH at 4.9. - Continue synthyroid   Depression - Continue home Effexor.   Disposition Planning Functional deconditioning BMI 49 Will see if he is able to get SNF vs CIR vs D/c home. Will likely d/c home once transportation to dialysis is set up.  - Appreciate SW help - Continue to engage with PT  R great toe wound, c/f ingrown toenail Toenail avulsion with podiatry last admission - Wound care per orders  Diet: Renal IVF: None,None VTE: DOAC Code: Full PT/OT recs: CIR  Dispo: Anticipated discharge - location pending - in 3 days pending social work needs.   Lovie Macadamia MD Internal Medicine Resident PGY-1 Pager: [phone] Please contact the on call pager after 5 pm and on weekends at [phone].

## 2023-09-28 NOTE — Progress Notes (Signed)
 Occupational Therapy Treatment Patient Details Name: Phillip Young MRN: [id number] DOB: [birth year] Today's Date: 09/28/2023   History of present illness Pt is a 38 y/o M admitted on 09/21/23 after presenting with AMS. Pt just d/c from hospital on 2/17 for tx of a-fib with RVR & pt underwent hemodialysis. Pt is being treated for hyperkalemia, acute toxic/metabolic encephalopathy. PMH: chronic respiratory failure, CHF, HTN, nonischemic cardiomyopathy, a-flutter, obesity, prediabetes   OT comments  Pt seen later in the PM following HD, he c/o bilat hand cramping with strong tension noted upon palpation causing thumbs to adduct and fingers to lock into extension. Provided massage, then educated pt on proper hydration and strategies to reduce onset of cramps. Pt fatiguing with short distance ambulation in room but CGA with RW support. Messaged out to Internal Medicine and CSW team regarding pt's referral for power w/c. OT to continue to progress pt as able, Patient would benefit from post acute Home OT services to help maximize functional independence in natural environment        If plan is discharge home, recommend the following:  A lot of help with bathing/dressing/bathroom;Assistance with cooking/housework;Help with stairs or ramp for entrance;Assist for transportation   Equipment Recommendations  Other (comment) (electric scooter or similar)    Recommendations for Other Services      Precautions / Restrictions Precautions Precautions: Fall Recall of Precautions/Restrictions: Intact Precaution/Restrictions Comments: Watch BP and HR Restrictions Weight Bearing Restrictions Per Provider Order: No       Mobility Bed Mobility Overal bed mobility: Needs Assistance Bed Mobility: Supine to Sit     Supine to sit: Supervision, HOB elevated, Used rails     General bed mobility comments: pt EOB    Transfers Overall transfer level: Needs assistance Equipment used: Rolling walker (2  wheels) Transfers: Sit to/from Stand Sit to Stand: Supervision, From elevated surface           General transfer comment: Pt raises bed to a signifcant height, continued to discuss with pt ways to elevate his surfaces at home if needing so much assist to stand     Balance Overall balance assessment: Needs assistance Sitting-balance support: No upper extremity supported, Single extremity supported, Feet supported Sitting balance-Leahy Scale: Good Sitting balance - Comments: able to sit EOB without LOB   Standing balance support: Bilateral upper extremity supported, During functional activity, Reliant on assistive device for balance Standing balance-Leahy Scale: Fair                             ADL either performed or assessed with clinical judgement   ADL                       Lower Body Dressing: Sitting/lateral leans;Moderate assistance Lower Body Dressing Details (indicate cue type and reason): to don bilat slides, swelling in bilat feet continue to make it challenging for pt to slip on             Functional mobility during ADLs: Contact guard assist;Rolling walker (2 wheels)      Extremity/Trunk Assessment              Vision       Perception     Praxis     Communication Communication Communication: No apparent difficulties   Cognition Arousal: Alert Behavior During Therapy: WFL for tasks assessed/performed Cognition: No apparent impairments  Following commands: Intact        Cueing   Cueing Techniques: Verbal cues, Visual cues  Exercises Other Exercises Other Exercises: manual muscle massage to bilat hands and squeezing of hot packs to help with hand cramping.    Shoulder Instructions       General Comments VSS on 6L    Pertinent Vitals/ Pain       Pain Assessment Faces Pain Scale: Hurts even more Pain Location: bilat hands Pain Descriptors / Indicators: Contraction,  Cramping, Discomfort Pain Intervention(s): Monitored during session, Limited activity within patient's tolerance, Heat applied, Utilized relaxation techniques  Home Living                                          Prior Functioning/Environment              Frequency  Min 2X/week        Progress Toward Goals  OT Goals(current goals can now be found in the care plan section)  Progress towards OT goals: Progressing toward goals  Acute Rehab OT Goals Patient Stated Goal: To go home OT Goal Formulation: With patient Time For Goal Achievement: 10/07/23 Potential to Achieve Goals: Good  Plan      Co-evaluation                 AM-PAC OT "6 Clicks" Daily Activity     Outcome Measure   Help from another person eating meals?: None Help from another person taking care of personal grooming?: A Little Help from another person toileting, which includes using toliet, bedpan, or urinal?: A Little Help from another person bathing (including washing, rinsing, drying)?: A Lot Help from another person to put on and taking off regular upper body clothing?: A Little Help from another person to put on and taking off regular lower body clothing?: A Lot 6 Click Score: 17    End of Session Equipment Utilized During Treatment: Gait belt;Rolling walker (2 wheels);Oxygen  OT Visit Diagnosis: Unsteadiness on feet (R26.81);Other abnormalities of gait and mobility (R26.89);Muscle weakness (generalized) (M62.81)   Activity Tolerance Patient tolerated treatment well   Patient Left in bed;with call bell/phone within reach;Other (comment) (pt sitting EOB)   Nurse Communication Mobility status        Time: 9604-5409 OT Time Calculation (min): 33 min  Charges: OT General Charges $OT Visit: 1 Visit OT Treatments $Therapeutic Activity: 8-22 mins $Massage: 8-22 mins  09/28/2023  AB, OTR/L  Acute Rehabilitation Services  Office: [phone]   Tristan Schroeder 09/28/2023, 5:37 PM

## 2023-09-28 NOTE — Progress Notes (Signed)
 [facility] Progress Note   Subjective:    Seen and examined in [facility]. Doing ok this morning. Didn't take midodrine yet, doesn't like how it makes him feel. BP up at start of dialysis.   Objective Vitals:   09/28/23 0752 09/28/23 0804 09/28/23 0830 09/28/23 0900  BP: 95/71 (!) 171/127 101/64 (!) 141/87  Pulse: (!) 29 99 76 (!) 55  Resp: (!) 25 (!) 22 20 18   Temp: 98.8 F (37.1 C)     TempSrc:      SpO2: 100% 100% 99% 100%  Weight: (!) 177 kg     Height:       Physical Exam General:chronically ill appearing, on 3L [place]  Heart: Tachy Lungs:Clear  Abdomen:NTND, soft Extremities: trace LE edema; no open wounds, lesions  Dialysis Access: [facility]   Filed Weights   09/26/23 0830 09/26/23 1325 09/28/23 0752  Weight: (!) 173 kg (!) 170 kg (!) 177 kg    Intake/Output Summary (Last 24 hours) at 09/28/2023 0909 Last data filed at 09/28/2023 0600 Gross per 24 hour  Intake 720 ml  Output 0 ml  Net 720 ml    Additional Objective Labs: Basic Metabolic Panel: Recent Labs  Lab 09/26/23 0317 09/27/23 0327 09/28/23 0322  NA 133* 132* 134*  K 6.1* 4.4 5.5*  CL 95* 96* 93*  CO2 19* 20* 21*  GLUCOSE 82 90 87  BUN 73* 56* 77*  CREATININE 9.52* 7.33* 8.77*  CALCIUM 9.7 8.4* 9.3  PHOS 8.4* 6.5* 8.8*   Liver Function Tests: Recent Labs  Lab 09/26/23 0317 09/27/23 0327 09/28/23 0322  ALBUMIN 3.1* 3.1* 3.4*   CBC: Recent Labs  Lab 09/22/23 1549 09/23/23 0511 09/26/23 0317 09/27/23 1953 09/28/23 0322  WBC 7.3 6.9 6.6 8.3 7.3  HGB 9.1* 9.9* 9.9* 10.0* 10.1*  HCT 27.5* 28.7* 30.6* 30.7* 30.2*  MCV 102.2* 100.7* 103.7* 103.0* 101.3*  PLT 186 184 162 189 186     Recent Labs  Lab 09/23/23 1738 09/24/23 0737  GLUCAP 128* 109*    Medications:  anticoagulant sodium citrate       apixaban  5 mg Oral BID   Chlorhexidine Gluconate Cloth  6 each Topical Q0600   cinacalcet  30 mg Oral Q M,W,F-HD   clindamycin   Topical BID   digoxin  0.125 mg Oral Q M,W,F-HD    levothyroxine  25 mcg Oral Q0600   midodrine  30 mg Oral TID WC   neomycin-bacitracin-polymyxin   Topical Daily   pregabalin  50 mg Oral Daily   sevelamer carbonate  1,600 mg Oral TID WC   venlafaxine XR  37.5 mg Oral Daily    Dialysis Orders: [facility] MWF 4.5 hrs 180NRe 400/800 155.5 kg 2.0 K/2.0 Ca TDC - no Heparin HIT + Flush TDC ports with citrate - Mircera 200 mcg IV q 2 weeks (last dose 08/24/2023) - Sensipar 30 mg PO three times per week - Venofer 100 mg IV X 10 ordered in January. Has gotten 4/10 doses Last Tsat 17% 09/15/2023   PATIENT DOES NOT ACCEPT BLOOD PRODUCTS.    Assessment/Plan: ESRD with HD, HFrEF, atrial fibrillation on Eliquis and digoxin, chronic respiratory failure 2/2 amiodarone toxicity, BMI 49, T2DM, and polyneuropathy returning to [facility] from SNF d/t toxic metabolic encephalopathy.   Toxic metabolic encephalopathy- Resolved. MS at baseline.  PAF - on digoxin per primary. Volume management with dialysis  Hyperkalemia- Resolved with HD.  ESRD -  MWF.  Continue on schedule   Hypotension - on  midodrine 30mg  TID. Volume  - 3.5 -4L UF with HD. UF limited by hypotension  Anemia  - HGB 10. No recent ESA. Hold Fe load for now. Follow Hgb and start ESA if remains <10.  Chronic respiratory failure - 2L O2 at baseline  Metabolic bone disease -  Calcium in goal.  Phos variable.  Continue binders, sensipar and VDRA.  HFrEF - EF 30-35%   Nutrition - Low Albumin. Add protein supplements Dispo - pending rehab needs   Tomasa Blase PA-C [facility] 09/28/2023,9:09 AM

## 2023-09-28 NOTE — Procedures (Signed)
 C/o cramps, UF turned off.

## 2023-09-29 ENCOUNTER — Other Ambulatory Visit (HOSPITAL_COMMUNITY): Payer: Self-pay

## 2023-09-29 DIAGNOSIS — E875 Hyperkalemia: Secondary | ICD-10-CM | POA: Diagnosis not present

## 2023-09-29 LAB — CBC
HCT: 27.4 % — ABNORMAL LOW (ref 39.0–52.0)
Hemoglobin: 8.9 g/dL — ABNORMAL LOW (ref 13.0–17.0)
MCH: 33.7 pg (ref 26.0–34.0)
MCHC: 32.5 g/dL (ref 30.0–36.0)
MCV: 103.8 fL — ABNORMAL HIGH (ref 80.0–100.0)
Platelets: 178 10*3/uL (ref 150–400)
RBC: 2.64 MIL/uL — ABNORMAL LOW (ref 4.22–5.81)
RDW: 17.3 % — ABNORMAL HIGH (ref 11.5–15.5)
WBC: 7.6 10*3/uL (ref 4.0–10.5)
nRBC: 0 % (ref 0.0–0.2)

## 2023-09-29 LAB — RENAL FUNCTION PANEL
Albumin: 3.2 g/dL — ABNORMAL LOW (ref 3.5–5.0)
Anion gap: 17 — ABNORMAL HIGH (ref 5–15)
BUN: 56 mg/dL — ABNORMAL HIGH (ref 6–20)
CO2: 22 mmol/L (ref 22–32)
Calcium: 8.7 mg/dL — ABNORMAL LOW (ref 8.9–10.3)
Chloride: 94 mmol/L — ABNORMAL LOW (ref 98–111)
Creatinine, Ser: 6.94 mg/dL — ABNORMAL HIGH (ref 0.61–1.24)
GFR, Estimated: 10 mL/min — ABNORMAL LOW (ref >60)
Glucose, Bld: 77 mg/dL (ref 70–99)
Phosphorus: 6.6 mg/dL — ABNORMAL HIGH (ref 2.5–4.6)
Potassium: 3.6 mmol/L (ref 3.5–5.1)
Sodium: 133 mmol/L — ABNORMAL LOW (ref 135–145)

## 2023-09-29 LAB — LIPID PANEL
Cholesterol: 106 mg/dL (ref 0–200)
HDL: 40 mg/dL — ABNORMAL LOW (ref >40)
LDL Cholesterol: 46 mg/dL (ref 0–99)
Total CHOL/HDL Ratio: 2.7 ratio
Triglycerides: 100 mg/dL (ref <150)
VLDL: 20 mg/dL (ref 0–40)

## 2023-09-29 LAB — MAGNESIUM: Magnesium: 2 mg/dL (ref 1.7–2.4)

## 2023-09-29 MED ORDER — DARBEPOETIN ALFA 150 MCG/0.3ML IJ SOSY: 150.0000 ug | PREFILLED_SYRINGE | INTRAMUSCULAR | Status: DC

## 2023-09-29 MED ORDER — DICLOFENAC SODIUM 1 % EX GEL
2.0000 g | Freq: Four times a day (QID) | CUTANEOUS | 0 refills | Status: DC
  Filled 2023-09-29: qty 50, 7d supply, fill #0

## 2023-09-29 MED ORDER — OXYCODONE HCL 5 MG PO TABS
5.0000 mg | ORAL_TABLET | Freq: Once | ORAL | Status: AC
  Administered 2023-09-29: 5 mg via ORAL
  Filled 2023-09-29: qty 1

## 2023-09-29 MED ORDER — DICLOFENAC SODIUM 1 % EX GEL
2.0000 g | Freq: Four times a day (QID) | CUTANEOUS | Status: DC
  Administered 2023-09-29: 2 g via TOPICAL
  Filled 2023-09-29: qty 100

## 2023-09-29 MED ORDER — LIDOCAINE 5 % EX PTCH
1.0000 | MEDICATED_PATCH | CUTANEOUS | Status: DC
  Administered 2023-09-29: 1 via TRANSDERMAL
  Filled 2023-09-29: qty 1

## 2023-09-29 MED ORDER — CLINDAMYCIN PHOSPHATE 1 % EX GEL
Freq: Two times a day (BID) | CUTANEOUS | 0 refills | Status: DC
  Filled 2023-09-29: qty 30, 15d supply, fill #0

## 2023-09-29 NOTE — Discharge Summary (Signed)
 Name: Phillip Young MRN: [id number] DOB: January 15, 1986 38 y.o. PCP: [name], DO  Date of Admission: 09/21/2023  9:00 PM Date of Discharge:  03/06/025 Attending Physician: Dr. 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   apixaban 5 MG Tabs tablet Commonly known as: ELIQUIS Take 1 tablet (5 mg total) by mouth 2 (two) times daily.   cinacalcet 30 MG tablet Commonly known as: SENSIPAR Take 1 tablet (30 mg total) by mouth every dialysis. What changed: when to take this   clindamycin 1 % gel Commonly known as: CLINDAGEL Apply topically 2 (two) times daily.   D3-1000 25 MCG (1000 UT) tablet Generic drug: Cholecalciferol Take 500 Units by mouth daily. What changed: Another medication with the same name was removed. Continue taking this medication, and follow the directions you see here.   Darbepoetin Alfa 150 MCG/0.3ML Sosy injection Commonly known as: ARANESP To be given  during dialysis by [facility] per Nephrology. Not to be given to patient at SNF.   digoxin 0.125 MG tablet Commonly known as: LANOXIN Take 1 tablet (0.125 mg total) by mouth 3 (three) times a week. Monday, Wednesday, Friday (dialysis days)   levothyroxine 25 MCG tablet Commonly known as: SYNTHROID Take 1 tablet (25 mcg total) by mouth daily at 6 (six) AM.   midodrine 10 MG tablet Commonly known as: PROAMATINE Take 3 tablets (30 mg total) by mouth 3 (three) times daily with meals.   naloxone 2 MG/2ML injection Commonly known as: NARCAN Inject 2 mg into the skin as needed.   Oxycodone HCl 10 MG Tabs Take 1 tablet (10 mg total) by mouth every 6 (six) hours as needed. What changed:  when to take this reasons to take this   pregabalin 50 MG capsule Commonly known as: LYRICA Take 1 capsule (50 mg total) by mouth daily. Take one capsule daily.   sevelamer carbonate 800 MG tablet Commonly known as: RENVELA Take 2 tablets (1,600 mg total) by mouth 3 (three) times daily with meals.   tirzepatide 2.5 MG/0.5ML injection vial Commonly known as: ZEPBOUND Inject 2.5 mg into the skin once a week.   triamcinolone ointment 0.1 % Commonly known as: KENALOG Apply topically 2 (two) times daily.   venlafaxine XR 37.5 MG 24 hr capsule Commonly known as: Effexor XR Take 1 capsule (37.5 mg total) by mouth daily.  Durable Medical Equipment  (From admission, onward)           Start     Ordered   09/28/23 1600  For home use only DME Wheelchair electric  Once        09/28/23 1602            DISPOSITION AND FOLLOW-UP:  Mr.Paddy Krizan was discharged from [facility] in [place] condition. At the hospital follow up visit please address:  Follow-up Recommendations: Consults: Cardiology, Podiatry follow up recommended. Numbers provided in D/c instructions  Labs: CBC Studies: None Medications: No major medication changes  Follow-up  Appointments:  Follow-up Information     [facility] Follow up.   Specialty: Rehabilitation Why: One time evaluation for Motorized Tree surgeon information: [address] [place] [address] [phone]        [place] Follow up.   Why: Someone will call you to schedule resuption of care visit. Contact information: [address] [place] [address] [phone]                 HOSPITAL COURSE:  Patient Summary: Phillip Young is a 38 y.o. male with multiple significant chronic conditions including ESRD with HD, HFrEF, atrial fibrillation on Eliquis and digoxin, chronic respiratory failure 2/2 amiodarone toxicity, BMI 49, T2DM, and polyneuropathy returning to [facility] from SNF d/t toxic metabolic encephalopathy.    In brief - per patient - at SNF he was given tramadol and oxycodone and became extremely somnolent. They were then given Narcan in the setting of presume OD. Following Narcan it appears patient went into acute withdrawal and was very agitated and was having uncontrollable movements. He was brought to [facility] where he was given Ativan and Geodon - This was distressing to the patient. He went to HD and quickly returned to baseline mentation.   His hospital course was complicated by insurance issues.  From what I understand, the patient was accidentally enrolled in an inappropriate insurance plan.  Social work has been working diligently in order to try and rectify this.  In the meantime, patient will have help from aides and family in order to get to dialysis.  We have also begun steps for trying to obtain a motorized wheelchair for this patient which I believe will be extremely helpful.  He will follow with [place] shortly in clinic with his PCP, Dr. Ninfa Meeker.  ESRD on HD Anion gap metabolic acidosis Anemia of CKD Patient underwent HD per nephrology while admitted. His BP remained stable  throughout admission. His home midodrine, cinacalcet, and renvela were continued.    LLE coolness ABIs showed normal right ABI, TBI. Normal left ABI, reduced TBI.  - Consider outpatient vascular consultation  - Lipid panel w/ LDL in the 40's no indication for statin at this time.    Paroxysmal atrial fibrillation RVR at admission improved with volume management via HD. His home eliquis and digoxin were continued. Provided info for cardiology follow up.   Chronic hypoxic respiratory failure Hx presumed amiodarone toxicity HFrEF (LV EF <20%) Home digoxin and was continued. He was maintained on supplemental O2.    Chronic somatic and neuropathic pain Chronic opiate therapy Managed with tylenol, venlafaxine, lyrica, and oxycodone.   Hypothyroidism Continued home synthroid.   Depression Continued home venlafaxine.  R great toe wound, Toenail avulsion with podiatry last admission, provided info for podiatry follow up   DISCHARGE INSTRUCTIONS:   Discharge Instructions  Ambulatory referral to Occupational Therapy   Complete by: As directed    One time evaluation for Motorized Wheelchair.   Ambulatory referral to Physical Therapy   Complete by: As directed    One time evaluation for Motorized Wheelchair   Call MD for:  difficulty breathing, headache or visual disturbances   Complete by: As directed    Call MD for:  extreme fatigue   Complete by: As directed    Call MD for:  persistant dizziness or light-headedness   Complete by: As directed    Call MD for:  severe uncontrolled pain   Complete by: As directed    Call MD for:  temperature >100.4   Complete by: As directed    Diet - low sodium heart healthy   Complete by: As directed    Increase activity slowly   Complete by: As directed    No wound care   Complete by: As directed        SUBJECTIVE:  Patient continues to do well today. He does endorse worsening of her existing back pain and intermittent coolness of  his extremities. We will go ahead and give him some additional oxycodone today for his pain. He will benefit from continued outpatient follow up regarding his neuropathy.   Discharge Vitals:   BP (!) 116/97 (BP Location: Left Arm)   Pulse (!) 111   Temp 98.2 F (36.8 C)   Resp 18   Ht 6' (1.829 m)   Wt (!) 177 kg Comment: Taken via bed.  SpO2 100%   BMI 52.92 kg/m   OBJECTIVE:  Physical Exam Cardiovascular:     Rate and Rhythm: Regular rhythm. Tachycardia present.  Pulmonary:     Effort: Pulmonary effort is normal.     Breath sounds: Normal breath sounds.  Abdominal:     General: Abdomen is flat. There is no distension.     Palpations: Abdomen is soft.     Tenderness: There is no abdominal tenderness.  Skin:    General: Skin is warm and dry.  Neurological:     General: No focal deficit present.     Mental Status: He is alert.      Pertinent Labs, Studies, and Procedures:     Latest Ref Rng & Units 09/29/2023    4:46 AM 09/28/2023    3:22 AM 09/27/2023    7:53 PM  CBC  WBC 4.0 - 10.5 K/uL 7.6  7.3  8.3   Hemoglobin 13.0 - 17.0 g/dL 8.9  13.2  44.0   Hematocrit 39.0 - 52.0 % 27.4  30.2  30.7   Platelets 150 - 400 K/uL 178  186  189        Latest Ref Rng & Units 09/29/2023    4:46 AM 09/28/2023    3:22 AM 09/27/2023    3:27 AM  CMP  Glucose 70 - 99 mg/dL 77  87  90   BUN 6 - 20 mg/dL 56  77  56   Creatinine 0.61 - 1.24 mg/dL 1.02  7.25  3.66   Sodium 135 - 145 mmol/L 133  134  132   Potassium 3.5 - 5.1 mmol/L 3.6  5.5  4.4   Chloride 98 - 111 mmol/L 94  93  96   CO2 22 - 32 mmol/L 22  21  20    Calcium 8.9 - 10.3 mg/dL 8.7  9.3  8.4     DG Chest Portable 1 View Result Date: 09/21/2023 CLINICAL DATA:  Shortness of breath EXAM: PORTABLE CHEST 1 VIEW COMPARISON:  09/07/2023 FINDINGS: Dialysis catheter is again seen and stable. Cardiac shadow is enlarged but also stable. Lungs are well aerated bilaterally. No focal infiltrate or effusion is seen. No bony abnormality is  noted. IMPRESSION: Stable cardiomegaly.  No acute abnormality is seen. Electronically Signed   By: Alcide Clever M.D.   On: 09/21/2023 21:30     Signed: Lovie Macadamia MD Internal Medicine Resident, PGY-1 [facility]  Pager: [phone] 1:11 PM, 09/29/2023

## 2023-09-29 NOTE — Discharge Planning (Signed)
 [facility] Patient Discharge Orders- [facility] CLINIC: [facility]  Patient's name: Phillip Young Admit/DC Dates: 09/21/2023 - 09/29/2023  Discharge Diagnoses: Encephalopathy - resolved    Recent Labs  Lab 09/29/23 0446  HGB 8.9*  K 3.6  CALCIUM 8.7*  PHOS 6.6*  ALBUMIN 3.2*   Aranesp: Given: --   Date of last dose/amount: --   PRBC's Given: -- Date/# of units: -- ESA dose for discharge:  Mircera 200 mcg IV q 2 weeks - give 3/7  Outpatient Dialysis Orders:  -Heparin: No change  -EDW No change   -Bath: No change   Access intervention/Change: ---  -IV Antibiotics: ---   OTHER/APPTS   Completed by: Tomasa Blase PA-C   D/C Meds to be reconciled by nurse after every discharge.    Reviewed by: MD:______ RN_______

## 2023-09-29 NOTE — Discharge Instructions (Addendum)
 You were hospitalized for altered mental status after receiving narcan. Thank you for allowing [place] to be part of your care.   We arranged for you to follow up at: [facility] - Dr. Ninfa Meeker on 03/13 @ 10:45am  Please note these changes made to your medications:  Please START taking:  Clindamycin Gel - apply to lesion of the gluteal cleft twice weekly  Please STOP taking:  Tramadol - Ultram   Please call to follow up with -  Podiatry - Dr. Jenkins [facility] at [facility] [address] [place], [place] [address] [phone]  Cardiology: Laurey Morale, MD  [address] [place] [address] [phone]    Please call our clinic if you have any questions or concerns, we may be able to help and keep you from a long and expensive emergency room wait. Our clinic and after hours phone number is [phone], the best time to call is Monday through Friday 9 am to 4 pm but there is always someone available 24/7 if you have an emergency. If you need medication refills please notify your pharmacy one week in advance and they will send [place] a request.

## 2023-09-29 NOTE — Progress Notes (Signed)
 Advised by RN CM of pt's d/c to home today. Contacted [facility] to be advised of pt's d/c today and that pt should resume care tomorrow.   Olivia Canter Renal Navigator [phone]

## 2023-09-29 NOTE — TOC Transition Note (Addendum)
 Transition of Care [facility]) - Discharge Note   Patient Details  Name: Phillip Young MRN: [id number] Date of Birth: [birth year]  Transition [facility] CM/SW Contact:  Phillip Young, Phillip Coria, RN Phone Number: 09/29/2023, 2:11 PM   Clinical Narrative:     Patient is scheduled for discharge today.  Readmission Risk Assessment done. CM called Medicaid Transportation to get an update on patient's Insurance status and spoke with [place]. States patient has to reapply for Adult Medicaid and it will take 45-90 days to be approved. Patient currently has a [facility] and it does not cover transportation. CM notified Patient, MD and patient's mother, Phillip Young. CM explained to Phillip Young that patient is Medically ready for discharge and will need transportation to and from outpatient dialysis. Phillip Young states they have a handicap van at home and patient's personal care giver and uncle will assist until his Medicaid is straightened out. States patient will be able to go to dialysis tomorrow at his outpatient clinic. MD notified.   CM faxed order for Motorized Wheelchair with outpatient PT/OT order to [facility] and [facility] [phone]) for a one time evaluation to assess for the Wheelchair. CM spoke with Judeth Cornfield and acknowledged fax received. CM placed copies of orders in patient's discharge folder per Lillian's request.   Home health info, Outpatient referral, hospital f/u and discharge instructions on AVS. Prescriptions sent to [facility] pharmacy and patient will receive meds prior discharge. PTAR scheduled to transport at discharge.  No further TOC needs noted.        Final next level of care: Home w Home Health Services Barriers to Discharge: Barriers Resolved   Patient Goals and CMS Choice Patient states their goals for this hospitalization and ongoing recovery are:: To return home CMS Medicare.gov Compare Post Acute Care list provided to:: Patient Choice offered to / list  presented to : Patient, Parent (Mother, Phillip Young)      Discharge Placement                Patient to be transferred to facility by: PTAR Name of family member notified: [facility] and Services Additional resources added to the After Visit Summary for                  DME Arranged: Physiological scientist DME Agency:  Insurance claims handler and [employer]) Date DME Agency Contacted: 09/29/23 Time DME Agency Contacted: 1207 Representative spoke with at DME Agency: Judeth Cornfield HH Arranged: PT, RN, Social Work [employer] Agency: [facility] Date HH Agency Contacted: 09/29/23 Time HH Agency Contacted: 1244 Representative spoke with at [facility] Agency: Adele Dan  Social Drivers of Health (SDOH) Interventions SDOH Screenings   Food Insecurity: No Food Insecurity (09/24/2023)  Housing: Low Risk  (09/24/2023)  Transportation Needs: Unmet Transportation Needs (09/24/2023)  Utilities: Not At Risk (09/24/2023)  Alcohol Screen: Low Risk  (09/21/2022)  Depression (PHQ2-9): High Risk (06/21/2023)  Financial Resource Strain: Low Risk  (03/24/2023)   Received from [facility]  Physical Activity: Inactive (09/21/2022)  Social Connections: Unknown (09/24/2023)  Stress: No Stress Concern Present (04/21/2023)   Received from [facility]  Tobacco Use: Medium Risk (09/21/2023)     Readmission Risk Interventions    09/23/2023    5:05 PM 07/05/2023    4:25 PM 05/24/2023    4:43 PM  Readmission Risk Prevention Plan  Transportation Screening Complete Complete Complete  PCP or Specialist Appt within 5-7 Days   Complete  Home Care Screening   Complete  Medication  Review (RN CM)   Referral to Pharmacy  HRI or Home Care Consult  Complete   Social Work Consult for Recovery Care Planning/Counseling  Complete   Palliative Care Screening  Not Applicable   Medication Review (RN Care Manager) Referral to Pharmacy Complete   PCP or Specialist appointment within 3-5 days of discharge Complete     HRI or Home Care Consult Complete    SW Recovery Care/Counseling Consult Complete    Palliative Care Screening Not Applicable    Skilled Nursing Facility Not Applicable

## 2023-09-29 NOTE — Progress Notes (Signed)
 PT Cancellation Note  Patient Details Name: Phillip Young MRN: [id number] DOB: 04-08-1986   Cancelled Treatment:    Reason Eval/Treat Not Completed: Other (comment) (deferred to prepare for d/c home)  Lyanne Co, PT  Acute Rehab Services Secure chat preferred Office [phone]    Lawana Chambers Christine Schiefelbein 09/29/2023, 1:46 PM

## 2023-09-29 NOTE — Plan of Care (Signed)
  Problem: Health Behavior/Discharge Planning: Goal: Ability to manage health-related needs will improve Outcome: Adequate for Discharge   Problem: Clinical Measurements: Goal: Ability to maintain clinical measurements within normal limits will improve Outcome: Adequate for Discharge Goal: Will remain free from infection Outcome: Adequate for Discharge Goal: Respiratory complications will improve Outcome: Adequate for Discharge Goal: Cardiovascular complication will be avoided Outcome: Adequate for Discharge   Problem: Activity: Goal: Risk for activity intolerance will decrease Outcome: Adequate for Discharge   Problem: Nutrition: Goal: Adequate nutrition will be maintained Outcome: Adequate for Discharge   Problem: Coping: Goal: Level of anxiety will decrease Outcome: Adequate for Discharge   Problem: Elimination: Goal: Will not experience complications related to bowel motility Outcome: Adequate for Discharge Goal: Will not experience complications related to urinary retention Outcome: Adequate for Discharge   Problem: Pain Managment: Goal: General experience of comfort will improve and/or be controlled Outcome: Adequate for Discharge   Problem: Safety: Goal: Ability to remain free from injury will improve Outcome: Adequate for Discharge   Problem: Skin Integrity: Goal: Risk for impaired skin integrity will decrease Outcome: Adequate for Discharge   Problem: Acute Rehab PT Goals(only PT should resolve) Goal: Pt Will Go Supine/Side To Sit Outcome: Adequate for Discharge Goal: Pt Will Go Sit To Supine/Side Outcome: Adequate for Discharge Goal: Pt Will Transfer Bed To Chair/Chair To Bed Outcome: Adequate for Discharge Goal: Pt Will Ambulate Outcome: Adequate for Discharge Goal: Pt/caregiver will Perform Home Exercise Program Outcome: Adequate for Discharge   Problem: Acute Rehab OT Goals (only OT should resolve) Goal: Pt. Will Perform Grooming Outcome:  Adequate for Discharge Goal: Pt. Will Perform Lower Body Bathing Outcome: Adequate for Discharge Goal: Pt. Will Perform Lower Body Dressing Outcome: Adequate for Discharge Goal: Pt. Will Transfer To Toilet Outcome: Adequate for Discharge Goal: OT Additional ADL Goal #3 Outcome: Adequate for Discharge   Problem: Education: Goal: Knowledge of disease and its progression will improve Outcome: Adequate for Discharge Goal: Individualized Educational Video(s) Outcome: Adequate for Discharge   Problem: Fluid Volume: Goal: Compliance with measures to maintain balanced fluid volume will improve Outcome: Adequate for Discharge   Problem: Health Behavior/Discharge Planning: Goal: Ability to manage health-related needs will improve Outcome: Adequate for Discharge   Problem: Nutritional: Goal: Ability to make healthy dietary choices will improve Outcome: Adequate for Discharge   Problem: Clinical Measurements: Goal: Complications related to the disease process, condition or treatment will be avoided or minimized Outcome: Adequate for Discharge

## 2023-09-29 NOTE — Progress Notes (Signed)
 [facility] Progress Note   Subjective:    Seen and examined at bedside. No new complaints. Looking through old pictures on his phone. 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   Objective Vitals:   09/28/23 1333 09/28/23 2055 09/29/23 0547 09/29/23 0826  BP: 108/86 (!) 119/49 103/83 (!) 116/97  Pulse: (!) 50 (!) 51 (!) 42 (!) 111  Resp: 18 18 18    Temp:  97.8 F (36.6 C) 98.2 F (36.8 C)   TempSrc:  Oral    SpO2: 96% 98% 100% 100%  Weight:      Height:       Physical Exam General: chronically ill appearing, on 3L [place]  Heart: Tachy Lungs: Clear, normal wob  Abdomen:NTND, soft Extremities: LE very tender; No sig edema or wounds Dialysis Access: [facility]   Filed Weights   09/26/23 0830 09/26/23 1325 09/28/23 0752  Weight: (!) 173 kg (!) 170 kg (!) 177 kg    Intake/Output Summary (Last 24 hours) at 09/29/2023 1057 Last data filed at 09/29/2023 0600 Gross per 24 hour  Intake 240 ml  Output 4050 ml  Net -3810 ml   Additional Objective Labs: Basic Metabolic Panel: Recent Labs  Lab 09/27/23 0327 09/28/23 0322 09/29/23 0446  NA 132* 134* 133*  K 4.4 5.5* 3.6  CL 96* 93* 94*  CO2 20* 21* 22  GLUCOSE 90 87 77  BUN 56* 77* 56*  CREATININE 7.33* 8.77* 6.94*  CALCIUM 8.4* 9.3 8.7*  PHOS 6.5* 8.8* 6.6*   Liver Function Tests: Recent Labs  Lab 09/27/23 0327 09/28/23 0322 09/29/23 0446  ALBUMIN 3.1* 3.4* 3.2*   CBC: Recent Labs  Lab 09/23/23 0511 09/26/23 0317 09/27/23 1953 09/28/23 0322 09/29/23 0446  WBC 6.9 6.6 8.3 7.3 7.6  HGB 9.9* 9.9* 10.0* 10.1* 8.9*  HCT 28.7* 30.6* 30.7* 30.2* 27.4*  MCV 100.7* 103.7* 103.0* 101.3* 103.8*  PLT 184 162 189 186 178     Recent Labs  Lab 09/23/23 1738 09/24/23 0737  GLUCAP 128* 109*    Medications:    apixaban  5 mg Oral BID   Chlorhexidine Gluconate Cloth  6 each Topical Q0600   cinacalcet  30 mg Oral Q M,W,F-HD   clindamycin   Topical BID   diclofenac  Sodium  2 g Topical QID   digoxin  0.125 mg Oral Q M,W,F-HD   levothyroxine  25 mcg Oral Q0600   lidocaine  1 patch Transdermal Q24H   midodrine  30 mg Oral TID WC   neomycin-bacitracin-polymyxin   Topical Daily   oxyCODONE  5 mg Oral Once   pregabalin  50 mg Oral Daily   sevelamer carbonate  1,600 mg Oral TID WC   venlafaxine XR  37.5 mg Oral Daily    Dialysis Orders: [facility] MWF 4.5 hrs 180NRe 400/800 155.5 kg 2.0 K/2.0 Ca TDC - no Heparin HIT + Flush TDC ports with citrate - Mircera 200 mcg IV q 2 weeks (last dose 08/24/2023) - Sensipar 30 mg PO three times per week - Venofer 100 mg IV X 10 ordered in January. Has gotten 4/10 doses Last Tsat 17% 09/15/2023   PATIENT DOES NOT ACCEPT BLOOD PRODUCTS.    Assessment/Plan: ESRD with HD, HFrEF, atrial fibrillation on Eliquis and digoxin, chronic respiratory failure 2/2 amiodarone toxicity, BMI 49, T2DM, and polyneuropathy returning to [facility] from SNF d/t toxic metabolic encephalopathy.   Toxic metabolic encephalopathy- Resolved. MS at baseline.  PAF - on digoxin per primary.  Volume management with dialysis  Hyperkalemia- Resolved with HD.  ESRD -  MWF.  Continue on schedule   Hypotension - on midodrine 30mg  TID. Volume  - 3.5 -4L UF with HD. UF limited by hypotension  Anemia  - Hgb 8.9 . No recent ESA. Resume ESA - Aranesp 150 on 3/7. Check Fe studies.  Chronic respiratory failure - 2L O2 at baseline  Metabolic bone disease -  Calcium in goal.  Phos variable.  Continue binders, sensipar and VDRA.  HFrEF - EF 30-35%   Nutrition - Low Albumin. Add protein supplements  Tomasa Blase PA-C [facility] 09/29/2023,10:57 AM

## 2023-09-30 ENCOUNTER — Telehealth: Payer: Self-pay

## 2023-09-30 NOTE — Transitions of Care (Post Inpatient/ED Visit) (Signed)
   09/30/2023  Name: Phillip Young MRN: [id number] DOB: 1985/07/29  Today's TOC FU Call Status: Today's TOC FU Call Status:: Successful TOC FU Call Completed TOC FU Call Complete Date: 09/30/23 Patient's Name and Date of Birth confirmed.  Transition Care Management Follow-up Telephone Call Date of Discharge: 09/29/23 Discharge Facility: [facility]) Type of Discharge: Inpatient Admission Primary Inpatient Discharge Diagnosis:: Hyperkalemia How have you been since you were released from the hospital?: Better Any questions or concerns?:  (Mother came on the phone and said [facility] was there this morning and patient has already had PT this morning and home health reviewed the disharge papers and medications she they do not need the calls.)  Items Reviewed:   Mother came on the phone and said [facility] was there this morning and patient has already had PT this morning and home health reviewed the disharge papers and medications she does not feel it needs to be reviewed again and declined TOC calls. Mother asked about speech therapy for the patient and states she will call his PCP to request an order. Patient has PCP hospital follow up scheduled for 10/06/23. No further outreach is indicated.    Hilbert Odor RN, CCM [place]  VBCI-Population Health RN Care Manager [phone]

## 2023-10-03 ENCOUNTER — Other Ambulatory Visit: Payer: Self-pay | Admitting: Student

## 2023-10-03 MED ORDER — OXYCODONE HCL 10 MG PO TABS: 10.0000 mg | ORAL_TABLET | Freq: Four times a day (QID) | ORAL | 0 refills | Status: DC | PRN

## 2023-10-03 NOTE — Telephone Encounter (Signed)
 Copied from CRM [id number]. Topic: Clinical - Medication Refill >> Oct 03, 2023  9:50 AM Louie Boston wrote: Most Recent Primary Care Visit:   Medication: Oxycodone HCl 10 MG TABS   Has the patient contacted their pharmacy? Yes (Agent: If no, request that the patient contact the pharmacy for the refill. If patient does not wish to contact the pharmacy document the reason why and proceed with request.) (Agent: If yes, when and what did the pharmacy advise?)  Is this the correct pharmacy for this prescription? Yes If no, delete pharmacy and type the correct one.  This is the patient's preferred pharmacy:  [id number] - [place], [place] - [address] AT [facility] OF [address] & [address] [place] [address] Phone: [phone] Fax: [phone]  [facility] [address] [place] [address] Phone: [phone] Fax: [phone]   Has the prescription been filled recently? No  Is the patient out of the medication? Yes  Has the patient been seen for an appointment in the last year OR does the patient have an upcoming appointment? Yes  Can we respond through MyChart? Yes  Agent: Please be advised that Rx refills may take up to 3 business days. We ask that you follow-up with your pharmacy.

## 2023-10-05 ENCOUNTER — Other Ambulatory Visit: Payer: Self-pay

## 2023-10-05 MED ORDER — OXYCODONE HCL 10 MG PO TABS: 10.0000 mg | ORAL_TABLET | Freq: Four times a day (QID) | ORAL | 0 refills | Status: DC | PRN

## 2023-10-06 ENCOUNTER — Ambulatory Visit

## 2023-10-06 ENCOUNTER — Ambulatory Visit: Admitting: Student

## 2023-10-06 VITALS — BP 98/51 | HR 83 | Temp 98.2°F

## 2023-10-06 VITALS — BP 98/51 | HR 83 | Temp 98.2°F | Ht 72.0 in

## 2023-10-06 DIAGNOSIS — I5020 Unspecified systolic (congestive) heart failure: Secondary | ICD-10-CM

## 2023-10-06 DIAGNOSIS — F1190 Opioid use, unspecified, uncomplicated: Secondary | ICD-10-CM | POA: Diagnosis not present

## 2023-10-06 DIAGNOSIS — Z0000 Encounter for general adult medical examination without abnormal findings: Secondary | ICD-10-CM

## 2023-10-06 DIAGNOSIS — L732 Hidradenitis suppurativa: Secondary | ICD-10-CM

## 2023-10-06 DIAGNOSIS — Z992 Dependence on renal dialysis: Secondary | ICD-10-CM | POA: Diagnosis not present

## 2023-10-06 DIAGNOSIS — R490 Dysphonia: Secondary | ICD-10-CM | POA: Diagnosis not present

## 2023-10-06 DIAGNOSIS — N186 End stage renal disease: Secondary | ICD-10-CM | POA: Diagnosis not present

## 2023-10-06 DIAGNOSIS — R29898 Other symptoms and signs involving the musculoskeletal system: Secondary | ICD-10-CM

## 2023-10-06 MED ORDER — CLINDAMYCIN PHOSPHATE 1 % EX GEL: Freq: Two times a day (BID) | CUTANEOUS | 0 refills | Status: DC

## 2023-10-06 MED ORDER — SEVELAMER CARBONATE 800 MG PO TABS: 1600.0000 mg | ORAL_TABLET | Freq: Three times a day (TID) | ORAL | 0 refills | Status: DC

## 2023-10-06 NOTE — Assessment & Plan Note (Signed)
 HFU due to overdose at a SNF having received oxycodone and tramadol simultaneously, causing somnolence, then received narcan, causing agitation. He has returned to his baseline now. Tramadol added to allergies. Do not give him more. Will continue his oxycodone. He has severe pain both neuropathic and somatic due to his critical illness in the summer of 2024. As this patient cannot produce urine, I am not checking urine tox. - oxycodone 10 q6h prn

## 2023-10-06 NOTE — Progress Notes (Signed)
 Subjective:   Phillip Young is a 38 y.o. male who presents for an Initial Medicare Annual Wellness Visit.    Patient Location: Other:  [facility]  Provider Location: Office/Clinic  I discussed the limitations of evaluation and management by telemedicine. The patient expressed understanding and agreed to proceed.   Visit Complete: In person   Cardiac Risk Factors include: advanced age (>80men, >25 women);obesity (BMI >30kg/m2);diabetes mellitus;male gender     Objective:    Today's Vitals   10/06/23 1122 10/06/23 1134  BP: (!) 98/51   Pulse: 83   Temp: 98.2 F (36.8 C)   TempSrc: Oral   SpO2: 100%   PainSc:  9    There is no height or weight on file to calculate BMI.     10/06/2023   11:30 AM 09/24/2023   12:55 AM 09/24/2023   12:50 AM 09/21/2023    9:19 PM 09/08/2023    2:00 PM 09/07/2023    2:40 PM 08/30/2023    9:55 AM  Advanced Directives  Does Patient Have a Medical Advance Directive? Yes  No No  Yes Yes  Type of Forensic scientist of [employer] Power of [employer] Power of Attorney Healthcare Power of Attorney  Does patient want to make changes to medical advance directive?   No - Patient declined  No - Patient declined  Yes (Inpatient - patient defers changing a medical advance directive and declines information at this time)  Copy of Healthcare Power of Attorney in Chart?   Yes - validated most recent copy scanned in chart (See row information) Yes - validated most recent copy scanned in chart (See row information), Physician notified   Yes - validated most recent copy scanned in chart (See row information)  Would patient like information on creating a medical advance directive?  No - Patient declined         Current Medications (verified) Outpatient Encounter Medications as of 10/06/2023  Medication Sig   acetaminophen (TYLENOL) 500 MG tablet Take 2 tablets (1,000 mg total) by mouth every 6 (six)  hours.   apixaban (ELIQUIS) 5 MG TABS tablet Take 1 tablet (5 mg total) by mouth 2 (two) times daily.   Cholecalciferol (D3-1000) 25 MCG (1000 UT) tablet Take 500 Units by mouth daily.   cinacalcet (SENSIPAR) 30 MG tablet Take 1 tablet (30 mg total) by mouth every dialysis. (Patient taking differently: Take 30 mg by mouth every Monday, Wednesday, and Friday.)   Darbepoetin Alfa (ARANESP) 150 MCG/0.3ML SOSY injection To be given during dialysis by [facility] per Nephrology. Not to be given to patient at SNF.   digoxin (LANOXIN) 0.125 MG tablet Take 1 tablet (0.125 mg total) by mouth 3 (three) times a week. Monday, Wednesday, Friday (dialysis days)   levothyroxine (SYNTHROID) 25 MCG tablet Take 1 tablet (25 mcg total) by mouth daily at 6 (six) AM.   midodrine (PROAMATINE) 10 MG tablet Take 3 tablets (30 mg total) by mouth 3 (three) times daily with meals.   naloxone (NARCAN) 2 MG/2ML injection Inject 2 mg into the skin as needed.   Oxycodone HCl 10 MG TABS Take 1 tablet (10 mg total) by mouth every 6 (six) hours as needed.   pregabalin (LYRICA) 50 MG capsule Take 1 capsule (50 mg total) by mouth daily. Take one capsule daily.   tirzepatide (ZEPBOUND) 2.5 MG/0.5ML injection vial Inject 2.5 mg into the skin once a week.   triamcinolone  ointment (KENALOG) 0.1 % Apply topically 2 (two) times daily.   venlafaxine XR (EFFEXOR XR) 37.5 MG 24 hr capsule Take 1 capsule (37.5 mg total) by mouth daily.   [DISCONTINUED] clindamycin (CLINDAGEL) 1 % gel Apply topically 2 (two) times daily.   [DISCONTINUED] sevelamer carbonate (RENVELA) 800 MG tablet Take 2 tablets (1,600 mg total) by mouth 3 (three) times daily with meals.   No facility-administered encounter medications on file as of 10/06/2023.    Allergies (verified) Amiodarone, Coreg [carvedilol], Heparin, Metoprolol, Amoxicillin, Other, and Tramadol   History: Past Medical History:  Diagnosis Date   Acute on chronic respiratory failure  with hypoxia ([name]) 04/21/2021   Acute on chronic systolic (congestive) heart failure ([name]) 02/26/2020   Amiodarone toxicity    Anemia    Atrial flutter ([name])    Biventricular congestive heart failure ([name])    Chronic hypoxemic respiratory failure ([name])    Class 3 severe obesity due to excess calories with serious comorbidity and body mass index (BMI) of 50.0 to 59.9 in adult ([name]) 02/26/2020   Essential hypertension 02/26/2020   GERD without esophagitis 02/26/2020   Hidradenitis suppurativa 02/26/2020   NICM (nonischemic cardiomyopathy) ([name])    Obesity hypoventilation syndrome ([name])    OSA (obstructive sleep apnea)    PAF (paroxysmal atrial fibrillation) ([name])    Pneumonia    Prediabetes 02/26/2020   Past Surgical History:  Procedure Laterality Date   ABSCESS DRAINAGE     AV FISTULA PLACEMENT Left 08/21/2021   Procedure: LEFT ARM ARTERIOVENOUS (AV) FISTULA.;  Surgeon: Nada Libman, MD;  Location: [facility] OR;  Service: Vascular;  Laterality: Left;   CARDIAC CATHETERIZATION     CARDIOVERSION N/A 10/09/2021   Procedure: CARDIOVERSION;  Surgeon: Laurey Morale, MD;  Location: [facility] ENDOSCOPY;  Service: Cardiovascular;  Laterality: N/A;   CARDIOVERSION N/A 05/28/2022   Procedure: CARDIOVERSION;  Surgeon: Laurey Morale, MD;  Location: [facility] ENDOSCOPY;  Service: Cardiovascular;  Laterality: N/A;   CARDIOVERSION N/A 06/07/2022   Procedure: CARDIOVERSION;  Surgeon: Laurey Morale, MD;  Location: [facility] ENDOSCOPY;  Service: Cardiovascular;  Laterality: N/A;   IR FLUORO GUIDE CV LINE RIGHT  03/10/2021   IR FLUORO GUIDE CV LINE RIGHT  04/22/2021   IR FLUORO GUIDE CV LINE RIGHT  08/20/2021   IR FLUORO GUIDE CV LINE RIGHT  03/01/2023   IR FLUORO GUIDE CV LINE RIGHT  03/09/2023   IR FLUORO GUIDE CV LINE RIGHT  05/30/2023   IR PTA VENOUS EXCEPT DIALYSIS CIRCUIT  03/09/2023   IR REMOVAL TUN CV CATH W/O FL  05/26/2023   IR REMOVE CV FIBRIN SHEATH  03/09/2023   IR US GUIDE VASC ACCESS RIGHT  03/10/2021    IR US GUIDE VASC ACCESS RIGHT  04/22/2021   IR US GUIDE VASC ACCESS RIGHT  05/30/2023   RIGHT HEART CATH N/A 03/06/2021   Procedure: RIGHT HEART CATH;  Surgeon: Dolores Patty, MD;  Location: [facility] INVASIVE CV LAB;  Service: Cardiovascular;  Laterality: N/A;   RIGHT/LEFT HEART CATH AND CORONARY ANGIOGRAPHY N/A 03/04/2020   Procedure: RIGHT/LEFT HEART CATH AND CORONARY ANGIOGRAPHY;  Surgeon: Laurey Morale, MD;  Location: [facility] INVASIVE CV LAB;  Service: Cardiovascular;  Laterality: N/A;   TEE WITHOUT CARDIOVERSION N/A 05/05/2021   Procedure: TRANSESOPHAGEAL ECHOCARDIOGRAM (TEE);  Surgeon: Laurey Morale, MD;  Location: [facility] ENDOSCOPY;  Service: Cardiovascular;  Laterality: N/A;   TEMPORARY DIALYSIS CATHETER  03/06/2021   Procedure: TEMPORARY DIALYSIS CATHETER;  Surgeon: Dolores Patty, MD;  Location:  [facility] INVASIVE CV LAB;  Service: Cardiovascular;;   Family History  Problem Relation Age of Onset   Heart disease Mother    Hypertension Mother    Pulmonary Hypertension Mother    Drug abuse Father        died due to Heroin overdose   Social History   Socioeconomic History   Marital status: Single    Spouse name: Not on file   Number of children: Not on file   Years of education: Not on file   Highest education level: Not on file  Occupational History   Not on file  Tobacco Use   Smoking status: Former    Current packs/day: 0.00    Types: Cigarettes    Quit date: 2019    Years since quitting: 6.2    Passive exposure: Current   Smokeless tobacco: Former   Tobacco comments:    quit in 2019  Vaping Use   Vaping status: Never Used  Substance and Sexual Activity   Alcohol use: Never   Drug use: Never   Sexual activity: Not on file  Other Topics Concern   Not on file  Social History Narrative   Not on file   Social Drivers of Health   Financial Resource Strain: Low Risk  (10/06/2023)   Overall Financial Resource Strain (CARDIA)    Difficulty of Paying Living Expenses: Not  hard at all  Food Insecurity: No Food Insecurity (10/06/2023)   Hunger Vital Sign    Worried About Running Out of Food in the Last Year: Never true    Ran Out of Food in the Last Year: Never true  Transportation Needs: Unmet Transportation Needs (10/06/2023)   PRAPARE - Transportation    Lack of Transportation (Medical): Yes    Lack of Transportation (Non-Medical): Yes  Physical Activity: Insufficiently Active (10/06/2023)   Exercise Vital Sign    Days of Exercise per Week: 7 days    Minutes of Exercise per Session: 10 min  Stress: No Stress Concern Present (10/06/2023)   [employer] of Occupational Health - Occupational Stress Questionnaire    Feeling of Stress : Not at all  Social Connections: Socially Isolated (10/06/2023)   Social Connection and Isolation Panel [NHANES]    Frequency of Communication with Friends and Family: More than three times a week    Frequency of Social Gatherings with Friends and Family: Once a week    Attends Religious Services: Never    Database administrator or Organizations: No    Attends Engineer, structural: Never    Marital Status: Never married    Tobacco Counseling Counseling given: Not Answered Tobacco comments: quit in 2019   Clinical Intake:  Pre-visit preparation completed: Yes  Pain : 0-10 Pain Score: 9  Pain Type: Chronic pain Pain Location: Buttocks Pain Descriptors / Indicators: Constant Pain Onset: More than a month ago     Nutritional Status: BMI > 30  Obese Diabetes: Yes CBG done?: No Did pt. bring in CBG monitor from home?: No  How often do you need to have someone help you when you read instructions, pamphlets, or other written materials from your doctor or pharmacy?: 1 - Never What is the last grade level you completed in school?: 12 grade  Interpreter Needed?: No  Information entered by :: Mahiya Kercheval   Activities of Daily Living    10/06/2023   11:25 AM 09/24/2023   12:50 AM  In your present  state of health, do you  have any difficulty performing the following activities:  Hearing? 0 0  Vision? 0 0  Difficulty concentrating or making decisions? 1 0  Walking or climbing stairs? 1   Dressing or bathing? 1   Doing errands, shopping? 1 0  Preparing Food and eating ? Y   Using the Toilet? N   In the past six months, have you accidently leaked urine? N   Do you have problems with loss of bowel control? N   Managing your Medications? N   Managing your Finances? Y   Housekeeping or managing your Housekeeping? Y     Patient Care Team: [name], DO as PCP - General (Internal Medicine) Regan Lemming, MD as PCP - Electrophysiology (Cardiology) Laurey Morale, MD as PCP - Cardiology (Cardiology) Center, [facility] Shaune Leeks as [facility] Care Management  Indicate any recent Medical Services you may have received from other than [facility] providers in the past year (date may be approximate).     Assessment:   This is a routine wellness examination for Phillip Young.  Hearing/Vision screen No results found.   Goals Addressed   None   Depression Screen    10/06/2023   11:27 AM 06/21/2023    2:53 PM 09/21/2022    3:51 PM 08/18/2022    4:37 PM 04/09/2022   10:38 AM 09/15/2021   11:26 AM 09/02/2021    1:22 PM  PHQ 2/9 Scores  PHQ - 2 Score 4 6 6  0 0 4 6  PHQ- 9 Score 19 18 21   0 17 21    Fall Risk    10/06/2023   11:30 AM 10/06/2023   10:23 AM 09/21/2022    3:53 PM 08/18/2022    4:37 PM 04/09/2022    9:18 AM  Fall Risk   Falls in the past year? 0 0 0 0 0  Number falls in past yr: 0 0 1 0 0  Injury with Fall? 0 0 0 0 0  Risk for fall due to : Impaired balance/gait No Fall Risks Impaired balance/gait Impaired mobility;Other (Comment)   Risk for fall due to: Comment    short of breath   Follow up Falls prevention discussed;Falls evaluation completed Falls evaluation completed;Falls prevention discussed Education provided;Falls evaluation completed Falls  evaluation completed Falls evaluation completed    MEDICARE RISK AT HOME: Medicare Risk at Home Any stairs in or around the home?: No If so, are there any without handrails?: No Home free of loose throw rugs in walkways, pet beds, electrical cords, etc?: Yes Adequate lighting in your home to reduce risk of falls?: Yes Life alert?: No Use of a cane, walker or w/c?: Yes Grab bars in the bathroom?: No Shower chair or bench in shower?: Yes Elevated toilet seat or a handicapped toilet?: Yes  TIMED UP AND GO:  Was the test performed? No    Cognitive Function:        10/06/2023   11:31 AM  6CIT Screen  What Year? 0 points  What month? 0 points  What time? 0 points  Count back from 20 0 points  Months in reverse 0 points  Repeat phrase 0 points  Total Score 0 points    Immunizations Immunization History  Administered Date(s) Administered   Hepb-cpg 06/10/2021   Influenza,inj,Quad PF,6+ Mos 10/12/2020    TDAP status: Due, Education has been provided regarding the importance of this vaccine. Advised may receive this vaccine at local pharmacy or Health Dept. Aware to provide a copy  of the vaccination record if obtained from local pharmacy or Health Dept. Verbalized acceptance and understanding.  Flu Vaccine status: Up to date  Pneumococcal vaccine status: Due, Education has been provided regarding the importance of this vaccine. Advised may receive this vaccine at local pharmacy or Health Dept. Aware to provide a copy of the vaccination record if obtained from local pharmacy or Health Dept. Verbalized acceptance and understanding.  Covid-19 vaccine status: Information provided on how to obtain vaccines.   Qualifies for Shingles Vaccine? No   Zostavax completed No   Shingrix Completed?: No.    Education has been provided regarding the importance of this vaccine. Patient has been advised to call insurance company to determine out of pocket expense if they have not yet received  this vaccine. Advised may also receive vaccine at local pharmacy or Health Dept. Verbalized acceptance and understanding.  Screening Tests Health Maintenance  Topic Date Due   COVID-19 Vaccine (1) Never done   Pneumococcal Vaccine 20-24 Years old (1 of 2 - PCV) Never done   FOOT EXAM  Never done   OPHTHALMOLOGY EXAM  Never done   DTaP/Tdap/Td (1 - Tdap) Never done   HEMOGLOBIN A1C  12/19/2023   Medicare Annual Wellness (AWV)  10/05/2024   Hepatitis C Screening  Completed   HIV Screening  Completed   HPV VACCINES  Aged Out   INFLUENZA VACCINE  Discontinued    Health Maintenance  Health Maintenance Due  Topic Date Due   COVID-19 Vaccine (1) Never done   Pneumococcal Vaccine 81-30 Years old (1 of 2 - PCV) Never done   FOOT EXAM  Never done   OPHTHALMOLOGY EXAM  Never done   DTaP/Tdap/Td (1 - Tdap) Never done      Lung Cancer Screening: (Low Dose CT Chest recommended if Age 30-80 years, 20 pack-year currently smoking OR have quit w/in 15years.) does not qualify.   Lung Cancer Screening Referral: DEFERRED TO PCP   Additional Screening:  Hepatitis C Screening: does qualify; Completed 03/26/2022  Vision Screening: Recommended annual ophthalmology exams for early detection of glaucoma and other disorders of the eye. Is the patient up to date with their annual eye exam?  No  Who is the provider or what is the name of the office in which the patient attends annual eye exams? Need one  If pt is not established with a provider, would they like to be referred to a provider to establish care? Yes .   Dental Screening: Recommended annual dental exams for proper oral hygiene  Diabetic Foot Exam: Diabetic Foot Exam: Completed 10/06/2023  Community Resource Referral / Chronic Care Management: CRR required this visit?  No   CCM required this visit?  No    Plan:     I have personally reviewed and noted the following in the patient's chart:   Medical and social history Use of  alcohol, tobacco or illicit drugs  Current medications and supplements including opioid prescriptions. Patient is currently taking opioid prescriptions. Information provided to patient regarding non-opioid alternatives. Patient advised to discuss non-opioid treatment plan with their provider. Functional ability and status Nutritional status Physical activity Advanced directives List of other physicians Hospitalizations, surgeries, and ER visits in previous 12 months Vitals Screenings to include cognitive, depression, and falls Referrals and appointments  In addition, I have reviewed and discussed with patient certain preventive protocols, quality metrics, and best practice recommendations. A written personalized care plan for preventive services as well as general preventive health recommendations  were provided to patient.     Derrell Lolling, CMA   10/06/2023   After Visit Summary: (In Person-Printed) AVS printed and given to the patient  Nurse Notes:  IN PERSON  [facility] CL[facility].   Phillip Young , Thank you for taking time to come for your Medicare Wellness Visit. I appreciate your ongoing commitment to your health goals. Please review the following plan we discussed and let me know if I can assist you in the future.   These are the goals we discussed:  Goals      I want help to deal with my daily pain and fatigue.     Patient Goals/Self Care Activities: -Patient/Caregiver will self-administer medications as prescribed as evidenced by self-report/primary caregiver report  -Patient/Caregiver will attend all scheduled provider appointments as evidenced by clinician review of documented attendance to scheduled appointments and patient/caregiver report -Patient/Caregiver will call pharmacy for medication refills as evidenced by patient report and review of pharmacy fill history as appropriate -Patient/Caregiver will call provider office for new concerns or questions as evidenced by review of  documented incoming telephone call notes and patient report -Patient/Caregiver verbalizes understanding of plan -Patient/Caregiver will focus on medication adherence by taking medications as prescribed  -Keep your airway clear from mucus build up  -Self assess breathing and make appointment with provider if you have beenany breathing issues -Utilize infection prevention strategies to reduce risk of respiratory infection   patient will demonstrate use of different relaxation  skills and/or diversional activities to assist with pain reduction (distraction, imagery, relaxation, massage, acupressure, TENS, heat, and cold application. and patient will verbalize acceptable level of pain relief and ability to engage in desired activities  -The patient will continue to follow his fluid restriction -continue current regiment -continue to do light exercise         This is a list of the screening recommended for you and due dates:  Health Maintenance  Topic Date Due   COVID-19 Vaccine (1) Never done   Pneumococcal Vaccination (1 of 2 - PCV) Never done   Complete foot exam   Never done   Eye exam for diabetics  Never done   DTaP/Tdap/Td vaccine (1 - Tdap) Never done   Hemoglobin A1C  12/19/2023   Medicare Annual Wellness Visit  10/05/2024   Hepatitis C Screening  Completed   HIV Screening  Completed   HPV Vaccine  Aged Out   Flu Shot  Discontinued

## 2023-10-06 NOTE — Assessment & Plan Note (Signed)
 Refill clindamycin. No signs of infection.

## 2023-10-06 NOTE — Patient Instructions (Signed)
 Health Maintenance, Male  Adopting a healthy lifestyle and getting preventive care are important in promoting health and wellness. Ask your health care provider about:  The right schedule for you to have regular tests and exams.  Things you can do on your own to prevent diseases and keep yourself healthy.  What should I know about diet, weight, and exercise?  Eat a healthy diet    Eat a diet that includes plenty of vegetables, fruits, low-fat dairy products, and lean protein.  Do not eat a lot of foods that are high in solid fats, added sugars, or sodium.  Maintain a healthy weight  Body mass index (BMI) is a measurement that can be used to identify possible weight problems. It estimates body fat based on height and weight. Your health care provider can help determine your BMI and help you achieve or maintain a healthy weight.  Get regular exercise  Get regular exercise. This is one of the most important things you can do for your health. Most adults should:  Exercise for at least 150 minutes each week. The exercise should increase your heart rate and make you sweat (moderate-intensity exercise).  Do strengthening exercises at least twice a week. This is in addition to the moderate-intensity exercise.  Spend less time sitting. Even light physical activity can be beneficial.  Watch cholesterol and blood lipids  Have your blood tested for lipids and cholesterol at 39 years of age, then have this test every 5 years.  You may need to have your cholesterol levels checked more often if:  Your lipid or cholesterol levels are high.  You are older than 39 years of age.  You are at high risk for heart disease.  What should I know about cancer screening?  Many types of cancers can be detected early and may often be prevented. Depending on your health history and family history, you may need to have cancer screening at various ages. This may include screening for:  Colorectal cancer.  Prostate cancer.  Skin cancer.  Lung  cancer.  What should I know about heart disease, diabetes, and high blood pressure?  Blood pressure and heart disease  High blood pressure causes heart disease and increases the risk of stroke. This is more likely to develop in people who have high blood pressure readings or are overweight.  Talk with your health care provider about your target blood pressure readings.  Have your blood pressure checked:  Every 3-5 years if you are 9-95 years of age.  Every year if you are 85 years old or older.  If you are between the ages of 29 and 29 and are a current or former smoker, ask your health care provider if you should have a one-time screening for abdominal aortic aneurysm (AAA).  Diabetes  Have regular diabetes screenings. This checks your fasting blood sugar level. Have the screening done:  Once every three years after age 23 if you are at a normal weight and have a low risk for diabetes.  More often and at a younger age if you are overweight or have a high risk for diabetes.  What should I know about preventing infection?  Hepatitis B  If you have a higher risk for hepatitis B, you should be screened for this virus. Talk with your health care provider to find out if you are at risk for hepatitis B infection.  Hepatitis C  Blood testing is recommended for:  Everyone born from [birth year] through 1966.  Anyone  with known risk factors for hepatitis C.  Sexually transmitted infections (STIs)  You should be screened each year for STIs, including gonorrhea and chlamydia, if:  You are sexually active and are younger than 39 years of age.  You are older than 39 years of age and your health care provider tells you that you are at risk for this type of infection.  Your sexual activity has changed since you were last screened, and you are at increased risk for chlamydia or gonorrhea. Ask your health care provider if you are at risk.  Ask your health care provider about whether you are at high risk for HIV. Your health care provider  may recommend a prescription medicine to help prevent HIV infection. If you choose to take medicine to prevent HIV, you should first get tested for HIV. You should then be tested every 3 months for as long as you are taking the medicine.  Follow these instructions at home:  Alcohol use  Do not drink alcohol if your health care provider tells you not to drink.  If you drink alcohol:  Limit how much you have to 0-2 drinks a day.  Know how much alcohol is in your drink. In the [place] one drink equals one 12 oz bottle of beer (355 mL), one 5 oz glass of wine (148 mL), or one 1 oz glass of hard liquor (44 mL).  Lifestyle  Do not use any products that contain nicotine or tobacco. These products include cigarettes, chewing tobacco, and vaping devices, such as e-cigarettes. If you need help quitting, ask your health care provider.  Do not use street drugs.  Do not share needles.  Ask your health care provider for help if you need support or information about quitting drugs.  General instructions  Schedule regular health, dental, and eye exams.  Stay current with your vaccines.  Tell your health care provider if:  You often feel depressed.  You have ever been abused or do not feel safe at home.  Summary  Adopting a healthy lifestyle and getting preventive care are important in promoting health and wellness.  Follow your health care provider's instructions about healthy diet, exercising, and getting tested or screened for diseases.  Follow your health care provider's instructions on monitoring your cholesterol and blood pressure.  This information is not intended to replace advice given to you by your health care provider. Make sure you discuss any questions you have with your health care provider.  Document Revised: 12/01/2020 Document Reviewed: 12/01/2020  Elsevier Patient Education  2024 [employer].

## 2023-10-06 NOTE — Assessment & Plan Note (Signed)
 Requests referral to speech therapy given hoarseness. Etiology is intubation in summer 2024. No dysphagia.

## 2023-10-06 NOTE — Assessment & Plan Note (Signed)
 He typically does MWF dialysis. He made it yesterday and will go again tomorrow. Pt will be brought by his mother or uncle.

## 2023-10-06 NOTE — Assessment & Plan Note (Signed)
 Phillip Young are working on getting him a motorized wheelchair. He has an evaluation at neuro rehabilitation on the 25th.

## 2023-10-06 NOTE — Progress Notes (Unsigned)
   CC: HFU  HPI:  [name] is a 38 y.o. male living with a history stated below and presents today for HFU. Please see problem based assessment and plan for additional details.  Past Medical History:  Diagnosis Date   Acute on chronic respiratory failure with hypoxia ([name]) 04/21/2021   Acute on chronic systolic (congestive) heart failure ([name]) 02/26/2020   Amiodarone toxicity    Anemia    Atrial flutter ([name])    Biventricular congestive heart failure ([name])    Chronic hypoxemic respiratory failure ([name])    Class 3 severe obesity due to excess calories with serious comorbidity and body mass index (BMI) of 50.0 to 59.9 in adult [facility]) 02/26/2020   Essential hypertension 02/26/2020   GERD without esophagitis 02/26/2020   Hidradenitis suppurativa 02/26/2020   NICM (nonischemic cardiomyopathy) ([name])    Obesity hypoventilation syndrome ([name])    OSA (obstructive sleep apnea)    PAF (paroxysmal atrial fibrillation) ([name])    Pneumonia    Prediabetes 02/26/2020    Review of Systems: ROS negative except for what is noted on the assessment and plan.  Vitals:   10/06/23 1023  BP: (!) 98/51  Pulse: 83  Temp: 98.2 F (36.8 C)  TempSrc: Oral  SpO2: 100%  Height: 6' (1.829 m)    Physical Exam: Constitutional: chronically ill-appearing man in wheelchair Eyes: conjunctiva non-erythematous Cardiovascular: regular rate and rhythm, no m/r/g Pulmonary/Chest: normal work of breathing on baseline 4L oxygen, lungs clear to auscultation bilaterally Abdominal: soft, non-tender, non-distended MSK: normal bulk and tone. 2+ chronic pitting edema in bilateral extremities Neurological: alert & oriented x 3, no focal deficit Skin: warm and dry Psych: normal mood and behavior  Assessment & Plan:   Patient discussed with Dr. Antony Contras  Chronic, continuous use of opioids HFU due to overdose at a SNF having received oxycodone and tramadol simultaneously, causing somnolence, then received  narcan, causing agitation. He has returned to his baseline now. Tramadol added to allergies. Do not give him more. Will continue his oxycodone. He has severe pain both neuropathic and somatic due to his critical illness in the summer of 2024. As this patient cannot produce urine, I am not checking urine tox. - oxycodone 10 q6h prn  Hoarseness of voice Requests referral to speech therapy given hoarseness. Etiology is intubation in summer 2024. No dysphagia.  ESRD (end stage renal disease) on dialysis [facility]) He typically does MWF dialysis. He made it yesterday and will go again tomorrow. Pt will be brought by his mother or uncle.  Hidradenitis suppurativa Refill clindamycin. No signs of infection.  Severe muscle deconditioning Will are working on getting him a motorized wheelchair. He has an evaluation at neuro rehabilitation on the 25th.  RTC 3 months for chronic disease checkup.  [name], D.O. [facility] Internal Medicine, PGY-1 Phone: [phone] Date 10/06/2023 Time 4:33 PM ct

## 2023-10-10 ENCOUNTER — Other Ambulatory Visit: Payer: Self-pay

## 2023-10-10 ENCOUNTER — Telehealth: Payer: Self-pay | Admitting: *Deleted

## 2023-10-10 ENCOUNTER — Other Ambulatory Visit: Payer: Self-pay | Admitting: Student

## 2023-10-10 ENCOUNTER — Emergency Department (HOSPITAL_COMMUNITY)

## 2023-10-10 ENCOUNTER — Inpatient Hospital Stay (HOSPITAL_COMMUNITY)
Admission: EM | Admit: 2023-10-10 | Discharge: 2023-10-24 | DRG: 640 | Disposition: A | Discharge status: Home Health | Attending: Internal Medicine | Admitting: Internal Medicine

## 2023-10-10 DIAGNOSIS — I959 Hypotension, unspecified: Secondary | ICD-10-CM | POA: Diagnosis present

## 2023-10-10 DIAGNOSIS — E871 Hypo-osmolality and hyponatremia: Secondary | ICD-10-CM | POA: Diagnosis present

## 2023-10-10 DIAGNOSIS — I9589 Other hypotension: Secondary | ICD-10-CM | POA: Diagnosis present

## 2023-10-10 DIAGNOSIS — I4892 Unspecified atrial flutter: Secondary | ICD-10-CM | POA: Diagnosis present

## 2023-10-10 DIAGNOSIS — D638 Anemia in other chronic diseases classified elsewhere: Secondary | ICD-10-CM | POA: Diagnosis not present

## 2023-10-10 DIAGNOSIS — F1120 Opioid dependence, uncomplicated: Secondary | ICD-10-CM | POA: Diagnosis present

## 2023-10-10 DIAGNOSIS — Z8249 Family history of ischemic heart disease and other diseases of the circulatory system: Secondary | ICD-10-CM

## 2023-10-10 DIAGNOSIS — E1122 Type 2 diabetes mellitus with diabetic chronic kidney disease: Secondary | ICD-10-CM | POA: Diagnosis present

## 2023-10-10 DIAGNOSIS — L89152 Pressure ulcer of sacral region, stage 2: Secondary | ICD-10-CM | POA: Diagnosis present

## 2023-10-10 DIAGNOSIS — I953 Hypotension of hemodialysis: Secondary | ICD-10-CM | POA: Diagnosis not present

## 2023-10-10 DIAGNOSIS — E039 Hypothyroidism, unspecified: Secondary | ICD-10-CM | POA: Diagnosis present

## 2023-10-10 DIAGNOSIS — S31000D Unspecified open wound of lower back and pelvis without penetration into retroperitoneum, subsequent encounter: Secondary | ICD-10-CM | POA: Diagnosis not present

## 2023-10-10 DIAGNOSIS — I5082 Biventricular heart failure: Secondary | ICD-10-CM | POA: Diagnosis present

## 2023-10-10 DIAGNOSIS — Z7901 Long term (current) use of anticoagulants: Secondary | ICD-10-CM

## 2023-10-10 DIAGNOSIS — Z888 Allergy status to other drugs, medicaments and biological substances status: Secondary | ICD-10-CM

## 2023-10-10 DIAGNOSIS — I132 Hypertensive heart and chronic kidney disease with heart failure and with stage 5 chronic kidney disease, or end stage renal disease: Secondary | ICD-10-CM | POA: Diagnosis present

## 2023-10-10 DIAGNOSIS — Z992 Dependence on renal dialysis: Secondary | ICD-10-CM | POA: Diagnosis not present

## 2023-10-10 DIAGNOSIS — I5022 Chronic systolic (congestive) heart failure: Secondary | ICD-10-CM | POA: Diagnosis present

## 2023-10-10 DIAGNOSIS — Z751 Person awaiting admission to adequate facility elsewhere: Secondary | ICD-10-CM

## 2023-10-10 DIAGNOSIS — F32A Depression, unspecified: Secondary | ICD-10-CM | POA: Diagnosis present

## 2023-10-10 DIAGNOSIS — N186 End stage renal disease: Secondary | ICD-10-CM | POA: Diagnosis present

## 2023-10-10 DIAGNOSIS — J810 Acute pulmonary edema: Secondary | ICD-10-CM | POA: Diagnosis present

## 2023-10-10 DIAGNOSIS — Z813 Family history of other psychoactive substance abuse and dependence: Secondary | ICD-10-CM

## 2023-10-10 DIAGNOSIS — I5020 Unspecified systolic (congestive) heart failure: Secondary | ICD-10-CM | POA: Diagnosis not present

## 2023-10-10 DIAGNOSIS — D631 Anemia in chronic kidney disease: Secondary | ICD-10-CM | POA: Diagnosis present

## 2023-10-10 DIAGNOSIS — E872 Acidosis, unspecified: Secondary | ICD-10-CM | POA: Diagnosis present

## 2023-10-10 DIAGNOSIS — S31000A Unspecified open wound of lower back and pelvis without penetration into retroperitoneum, initial encounter: Secondary | ICD-10-CM | POA: Diagnosis not present

## 2023-10-10 DIAGNOSIS — D539 Nutritional anemia, unspecified: Secondary | ICD-10-CM | POA: Diagnosis present

## 2023-10-10 DIAGNOSIS — I428 Other cardiomyopathies: Secondary | ICD-10-CM | POA: Diagnosis present

## 2023-10-10 DIAGNOSIS — J9611 Chronic respiratory failure with hypoxia: Secondary | ICD-10-CM | POA: Diagnosis not present

## 2023-10-10 DIAGNOSIS — Z6841 Body Mass Index (BMI) 40.0 and over, adult: Secondary | ICD-10-CM

## 2023-10-10 DIAGNOSIS — Z91148 Patient's other noncompliance with medication regimen for other reason: Secondary | ICD-10-CM

## 2023-10-10 DIAGNOSIS — L732 Hidradenitis suppurativa: Secondary | ICD-10-CM | POA: Diagnosis present

## 2023-10-10 DIAGNOSIS — L89159 Pressure ulcer of sacral region, unspecified stage: Secondary | ICD-10-CM | POA: Diagnosis not present

## 2023-10-10 DIAGNOSIS — Z79899 Other long term (current) drug therapy: Secondary | ICD-10-CM

## 2023-10-10 DIAGNOSIS — E875 Hyperkalemia: Secondary | ICD-10-CM | POA: Diagnosis present

## 2023-10-10 DIAGNOSIS — Z91158 Patient's noncompliance with renal dialysis for other reason: Secondary | ICD-10-CM

## 2023-10-10 DIAGNOSIS — N2581 Secondary hyperparathyroidism of renal origin: Secondary | ICD-10-CM | POA: Diagnosis present

## 2023-10-10 DIAGNOSIS — I480 Paroxysmal atrial fibrillation: Secondary | ICD-10-CM | POA: Diagnosis present

## 2023-10-10 DIAGNOSIS — E8779 Other fluid overload: Principal | ICD-10-CM | POA: Diagnosis present

## 2023-10-10 DIAGNOSIS — R0602 Shortness of breath: Principal | ICD-10-CM

## 2023-10-10 DIAGNOSIS — I482 Chronic atrial fibrillation, unspecified: Secondary | ICD-10-CM | POA: Diagnosis present

## 2023-10-10 DIAGNOSIS — E8770 Fluid overload, unspecified: Secondary | ICD-10-CM | POA: Diagnosis present

## 2023-10-10 DIAGNOSIS — G894 Chronic pain syndrome: Secondary | ICD-10-CM | POA: Diagnosis present

## 2023-10-10 DIAGNOSIS — J811 Chronic pulmonary edema: Secondary | ICD-10-CM | POA: Diagnosis not present

## 2023-10-10 DIAGNOSIS — J9621 Acute and chronic respiratory failure with hypoxia: Secondary | ICD-10-CM | POA: Diagnosis present

## 2023-10-10 DIAGNOSIS — Z23 Encounter for immunization: Secondary | ICD-10-CM

## 2023-10-10 DIAGNOSIS — R252 Cramp and spasm: Secondary | ICD-10-CM | POA: Diagnosis not present

## 2023-10-10 DIAGNOSIS — Z79890 Hormone replacement therapy (postmenopausal): Secondary | ICD-10-CM

## 2023-10-10 LAB — BASIC METABOLIC PANEL
Anion gap: 19 — ABNORMAL HIGH (ref 5–15)
BUN: 72 mg/dL — ABNORMAL HIGH (ref 6–20)
CO2: 16 mmol/L — ABNORMAL LOW (ref 22–32)
Calcium: 9 mg/dL (ref 8.9–10.3)
Chloride: 96 mmol/L — ABNORMAL LOW (ref 98–111)
Creatinine, Ser: 11.48 mg/dL — ABNORMAL HIGH (ref 0.61–1.24)
GFR, Estimated: 5 mL/min — ABNORMAL LOW (ref >60)
Glucose, Bld: 98 mg/dL (ref 70–99)
Potassium: 4.6 mmol/L (ref 3.5–5.1)
Sodium: 131 mmol/L — ABNORMAL LOW (ref 135–145)

## 2023-10-10 LAB — CBC WITH DIFFERENTIAL/PLATELET
Abs Immature Granulocytes: 0.03 10*3/uL (ref 0.00–0.07)
Basophils Absolute: 0.1 10*3/uL (ref 0.0–0.1)
Basophils Relative: 1 %
Eosinophils Absolute: 0.5 10*3/uL (ref 0.0–0.5)
Eosinophils Relative: 6 %
HCT: 31.6 % — ABNORMAL LOW (ref 39.0–52.0)
Hemoglobin: 10.3 g/dL — ABNORMAL LOW (ref 13.0–17.0)
Immature Granulocytes: 0 %
Lymphocytes Relative: 12 %
Lymphs Abs: 1.1 10*3/uL (ref 0.7–4.0)
MCH: 34.1 pg — ABNORMAL HIGH (ref 26.0–34.0)
MCHC: 32.6 g/dL (ref 30.0–36.0)
MCV: 104.6 fL — ABNORMAL HIGH (ref 80.0–100.0)
Monocytes Absolute: 0.6 10*3/uL (ref 0.1–1.0)
Monocytes Relative: 7 %
Neutro Abs: 6.9 10*3/uL (ref 1.7–7.7)
Neutrophils Relative %: 74 %
Platelets: 178 10*3/uL (ref 150–400)
RBC: 3.02 MIL/uL — ABNORMAL LOW (ref 4.22–5.81)
RDW: 17.6 % — ABNORMAL HIGH (ref 11.5–15.5)
WBC: 9.3 10*3/uL (ref 4.0–10.5)
nRBC: 0 % (ref 0.0–0.2)

## 2023-10-10 LAB — DIGOXIN LEVEL: Digoxin Level: 0.4 ng/mL — ABNORMAL LOW (ref 0.8–2.0)

## 2023-10-10 LAB — PHOSPHORUS: Phosphorus: 7.1 mg/dL — ABNORMAL HIGH (ref 2.5–4.6)

## 2023-10-10 LAB — MAGNESIUM: Magnesium: 2.1 mg/dL (ref 1.7–2.4)

## 2023-10-10 MED ORDER — PREGABALIN 25 MG PO CAPS
50.0000 mg | ORAL_CAPSULE | Freq: Every day | ORAL | Status: DC
  Administered 2023-10-11 – 2023-10-24 (×14): 50 mg via ORAL
  Filled 2023-10-10 (×14): qty 2

## 2023-10-10 MED ORDER — OXYCODONE HCL 5 MG PO TABS
10.0000 mg | ORAL_TABLET | Freq: Four times a day (QID) | ORAL | Status: DC | PRN
  Administered 2023-10-10 – 2023-10-24 (×37): 10 mg via ORAL
  Filled 2023-10-10 (×42): qty 2

## 2023-10-10 MED ORDER — SEVELAMER CARBONATE 800 MG PO TABS
1600.0000 mg | ORAL_TABLET | Freq: Three times a day (TID) | ORAL | Status: DC
  Administered 2023-10-12 – 2023-10-24 (×26): 1600 mg via ORAL
  Filled 2023-10-10 (×31): qty 2

## 2023-10-10 MED ORDER — CINACALCET HCL 30 MG PO TABS
30.0000 mg | ORAL_TABLET | ORAL | Status: DC | PRN
  Administered 2023-10-24: 30 mg via ORAL
  Filled 2023-10-10 (×2): qty 1

## 2023-10-10 MED ORDER — MIDODRINE HCL 5 MG PO TABS: 30.0000 mg | ORAL_TABLET | Freq: Three times a day (TID) | ORAL | Status: DC

## 2023-10-10 MED ORDER — VENLAFAXINE HCL ER 37.5 MG PO CP24
37.5000 mg | ORAL_CAPSULE | Freq: Every day | ORAL | Status: DC
  Administered 2023-10-11 – 2023-10-24 (×13): 37.5 mg via ORAL
  Filled 2023-10-10 (×14): qty 1

## 2023-10-10 MED ORDER — MIDODRINE HCL 5 MG PO TABS
30.0000 mg | ORAL_TABLET | Freq: Three times a day (TID) | ORAL | Status: DC
Start: 2023-10-10 — End: 2023-10-24
  Administered 2023-10-13 – 2023-10-24 (×4): 30 mg via ORAL
  Filled 2023-10-10 (×22): qty 6

## 2023-10-10 MED ORDER — DIGOXIN 125 MCG PO TABS
0.1250 mg | ORAL_TABLET | ORAL | Status: DC
  Administered 2023-10-12 – 2023-10-24 (×6): 0.125 mg via ORAL
  Filled 2023-10-10 (×7): qty 1

## 2023-10-10 MED ORDER — CHLORHEXIDINE GLUCONATE CLOTH 2 % EX PADS: 6.0000 | MEDICATED_PAD | Freq: Every day | CUTANEOUS | Status: DC

## 2023-10-10 MED ORDER — ACETAMINOPHEN 500 MG PO TABS
1000.0000 mg | ORAL_TABLET | Freq: Four times a day (QID) | ORAL | Status: DC
  Administered 2023-10-10 – 2023-10-24 (×34): 1000 mg via ORAL
  Filled 2023-10-10 (×38): qty 2

## 2023-10-10 MED ORDER — LEVOTHYROXINE SODIUM 25 MCG PO TABS
25.0000 ug | ORAL_TABLET | Freq: Every day | ORAL | Status: DC
  Administered 2023-10-11 – 2023-10-24 (×14): 25 ug via ORAL
  Filled 2023-10-10 (×15): qty 1

## 2023-10-10 MED ORDER — APIXABAN 5 MG PO TABS
5.0000 mg | ORAL_TABLET | Freq: Two times a day (BID) | ORAL | Status: DC
  Administered 2023-10-10 – 2023-10-24 (×27): 5 mg via ORAL
  Filled 2023-10-10 (×25): qty 1
  Filled 2023-10-10: qty 2
  Filled 2023-10-10 (×3): qty 1

## 2023-10-10 NOTE — ED Provider Notes (Signed)
 [place] EMERGENCY DEPARTMENT AT [facility] Provider Note   CSN: [id number] Arrival date & time: 10/10/23  1443     History Chief Complaint  Patient presents with  . Shortness of Breath    HPI Phillip Young is a 38 y.o. male presenting for acute on chronic dyspnea on exertion.  He is a 38 year old male with expansive medical history including morbid obesity, medication noncompliance, procedural noncompliance. States that his Friday dialysis session had to be cut short for catheter nonfunction and states that he missed a session this morning.  Now feels very short of breath. Denies fevers chills nausea vomiting syncope shortness of breath otherwise.  Patient's recorded medical, surgical, social, medication list and allergies were reviewed in the Snapshot window as part of the initial history.   Review of Systems   Review of Systems  Constitutional:  Negative for chills and fever.  HENT:  Negative for ear pain and sore throat.   Eyes:  Negative for pain and visual disturbance.  Respiratory:  Positive for cough and shortness of breath.   Cardiovascular:  Negative for chest pain and palpitations.  Gastrointestinal:  Negative for abdominal pain and vomiting.  Genitourinary:  Negative for dysuria and hematuria.  Musculoskeletal:  Negative for arthralgias and back pain.  Skin:  Negative for color change and rash.  Neurological:  Negative for seizures and syncope.  All other systems reviewed and are negative.   Physical Exam Updated Vital Signs BP 110/70   Pulse (!) 115   Temp 98.5 F (36.9 C) (Oral)   Resp (!) 29   Ht 6' (1.829 m)   Wt (!) 167.8 kg   SpO2 100%   BMI 50.18 kg/m  Physical Exam Vitals and nursing note reviewed.  Constitutional:      General: He is not in acute distress.    Appearance: He is well-developed.  HENT:     Head: Normocephalic and atraumatic.  Eyes:     Conjunctiva/sclera: Conjunctivae normal.  Cardiovascular:     Rate and  Rhythm: Normal rate and regular rhythm.     Heart sounds: No murmur heard. Pulmonary:     Effort: Respiratory distress present.     Breath sounds: Normal breath sounds.  Abdominal:     Palpations: Abdomen is soft.     Tenderness: There is no abdominal tenderness.  Musculoskeletal:        General: No swelling.     Cervical back: Neck supple.  Skin:    General: Skin is warm and dry.     Capillary Refill: Capillary refill takes less than 2 seconds.  Neurological:     Mental Status: He is alert.  Psychiatric:        Mood and Affect: Mood normal.     ED Course/ Medical Decision Making/ A&P    Procedures Procedures   Medications Ordered in ED Medications - No data to display  Medical Decision Making:    Phillip Young is a 38 y.o. male who presented to the ED today with shortness of breath detailed above.     Patient placed on continuous vitals and telemetry monitoring while in ED which was reviewed periodically.   Complete initial physical exam performed, notably the patient  was hemodynamically stable no acute distress.  Relatively tachypneic on his 4 L oxygen.  Started on his home CPAP.      Reviewed and confirmed nursing documentation for past medical history, family history, social history.    Initial Assessment:   With  the patient's presentation of shortness of breath, most likely diagnosis is volume overload secondary to failed/missed dialysis. Other diagnoses were considered including (but not limited to) ACS, PE, pneumonia, pneumothorax. These are considered less likely due to history of present illness and physical exam findings.   This is most consistent with an acute life/limb threatening illness complicated by underlying chronic conditions.  Initial Plan:  Screening labs including CBC and Metabolic panel to evaluate for infectious or metabolic etiology of disease.  Urinalysis with reflex culture ordered to evaluate for UTI or relevant urologic/nephrologic  pathology.  CXR to evaluate for structural/infectious intrathoracic pathology.  EKG to evaluate for cardiac pathology. Objective evaluation as below reviewed with plan for close reassessment  Initial Study Results:   Laboratory  Elevated creatinine, elevated BUN.  K WNL  EKG EKG was reviewed independently. Rate, rhythm, axis, intervals all examined and without medically relevant abnormality. ST segments without concerns for elevations.    Radiology All images reviewed independently. Agree with radiology report at this time.   DG Chest Portable 1 View Result Date: 10/10/2023 CLINICAL DATA:  Shortness of breath EXAM: PORTABLE CHEST 1 VIEW COMPARISON:  09/21/2023 FINDINGS: Right dialysis catheter remains in place, unchanged. Cardiomegaly with vascular congestion and probable interstitial edema. No effusions. No acute bony abnormality. IMPRESSION: Cardiomegaly with vascular congestion and probable mild interstitial edema. Electronically Signed   By: Charlett Nose M.D.   On: 10/10/2023 18:05     Consults:  Case discussed with nephrology.   Reassessment and Plan:   Nephrology service recommended admission to medicine for observation and stabilization overnight.  There is no dialysis sessions available this evening and they will interrogate his catheter in the a.m. when the full services available.   Disposition:   Based on the above findings, I believe this patient is stable for admission.    Patient/family educated about specific findings on our evaluation and explained exact reasons for admission.  Patient/family educated about clinical situation and time was allowed to answer questions.   Admission team communicated with and agreed with need for admission. Patient admitted. Patient  ready to move at this time.     Emergency Department Medication Summary:   Medications - No data to display       Clinical Impression: No diagnosis found.   Data Unavailable   Final Clinical  Impression(s) / ED Diagnoses Final diagnoses:  None    Rx / DC Orders ED Discharge Orders     None         Glyn Ade, MD 10/10/23 2325

## 2023-10-10 NOTE — H&P (Cosign Needed Addendum)
 Date: 10/10/2023               Patient Name:  Phillip Young MRN: [id number]  DOB: 17-Jun-1986 Age / Sex: 38 y.o., male   PCP: [name], DO         Medical Service: Internal Medicine Teaching Service         Attending Physician: Dr. Dickie La, MD      First Contact: Dr. Jeral Pinch, DO Pager [phone]    Second Contact: Dr. Modena Slater, DO Pager [phone]         After Hours (After 5p/  First Contact Pager: [phone]  weekends / holidays): Second Contact Pager: [phone]   SUBJECTIVE   Chief Complaint: SOB  History of Present Illness:  Phillip Young is a 38 yo male with chronic hypoxic respiratory failure on 2-4L home O2, HFrEF, Afib on Eliquis, chronic pain, ESRD on HD who presented to the ED with shortness of breath. The patient states that at his last HD session on 3/14 only 3L were removed due to his catheter not pulling fluid. He then missed dialysis today due to increased shortness of breath to the point where he could not even get dressed. His feet have felt swollen and painful and he couldn't tolerate stepping out of bed. Other than that, he has not missed any HD sessions since he was last discharged on 3/6.   Patient feels he has gained a significant amount of weight in the last week, unsure of the exact amount. Significant swelling particularly in his legs. He has an unchanged nonproductive chronic cough. He feels identical to previous admissions for volume overload related to missing HD sessions, denies any new or changed symptoms. Denies missing any doses of his medications. Denies fever, chest pain, palpitations, dizziness, bleeding, changes with bowel movements or urination.   ED Course: Afeb, tachycardic to 110s, normotensive. On 4L [place] Na 131, Bicarb 16, K 4.6, Cr 11.48 Hb 10.3 CXR: cardiomegaly with vascular congestion and probably mild interstitial edema  Meds:  Eliquis 5 mg bid Cinacalcet 30 mg with HD Vitamin D3 500U daily Aranesp with HD Digoxin  0.125 mg MWF Levothyroxine 25 mcg daily Midodrine 30 mg TID Oxycodone 10 mg q6h PRN Lyrica 50 mg daily Renvela 1600 mg TID Zepbound 2.5 mg/week - has not started (cannot afford it) Effexor 37.5 mg daily  Past Medical History  Past Surgical History:  Procedure Laterality Date   ABSCESS DRAINAGE     AV FISTULA PLACEMENT Left 08/21/2021   Procedure: LEFT ARM ARTERIOVENOUS (AV) FISTULA.;  Surgeon: Nada Libman, MD;  [facility];  Service: Vascular;  Laterality: Left;   CARDIAC CATHETERIZATION     CARDIOVERSION N/A 10/09/2021   Procedure: CARDIOVERSION;  Surgeon: Laurey Morale, MD;  Location: [facility] ENDOSCOPY;  Service: Cardiovascular;  Laterality: N/A;   CARDIOVERSION N/A 05/28/2022   Procedure: CARDIOVERSION;  Surgeon: Laurey Morale, MD;  Location[facility] ENDOSCOPY;  Service: Cardiovascular;  Laterality: N/A;   CARDIOVERSION N/A 06/07/2022   Procedure: CARDIOVERSION;  Surgeon: Laurey Morale, MD;  Location: [facility] ENDOSCOPY;  Service: Cardiovascular;  Laterality: N/A;   IR FLUORO GUIDE CV LINE RIGHT  03/10/2021   IR FLUORO GUIDE CV LINE RIGHT  04/22/2021   IR FLUORO GUIDE CV LINE RIGHT  08/20/2021   IR FLUORO GUIDE CV LINE RIGHT  03/01/2023   IR FLUORO GUIDE CV LINE RIGHT  03/09/2023   IR FLUORO GUIDE CV LINE RIGHT  05/30/2023   IR PTA VENOUS EXCEPT DIALYSIS  CIRCUIT  03/09/2023   IR REMOVAL TUN CV CATH W/O FL  05/26/2023   IR REMOVE CV FIBRIN SHEATH  03/09/2023   IR US GUIDE VASC ACCESS RIGHT  03/10/2021   IR US GUIDE VASC ACCESS RIGHT  04/22/2021   IR US GUIDE VASC ACCESS RIGHT  05/30/2023   RIGHT HEART CATH N/A 03/06/2021   Procedure: RIGHT HEART CATH;  Surgeon: Dolores Patty, MD;  [facility] INV[facility] LAB;  Service: Cardiovascular;  Laterality: N/A;   RIGHT/LEFT HEART CATH AND CORONARY ANGIOGRAPHY N/A 03/04/2020   Procedure: RIGHT/LEFT HEART CATH AND CORONARY ANGIOGRAPHY;  Surgeon: Laurey Morale, MD;  Location: [facility] INVASIVE CV LAB;  Service: Cardiovascular;  Laterality:  N/A;   TEE WITHOUT CARDIOVERSION N/A 05/05/2021   Procedure: TRANSESOPHAGEAL ECHOCARDIOGRAM (TEE);  Surgeon: Laurey Morale, MD;  Location: [facility] ENDOSCOPY;  Service: Cardiovascular;  Laterality: N/A;   TEMPORARY DIALYSIS CATHETER  03/06/2021   Procedure: TEMPORARY DIALYSIS CATHETER;  Surgeon: Dolores Patty, MD;  [facility]: [facility] INVASIVE CV LAB;  Service: Cardiovascular;;   Social:  Lives With:mother Support: family Level of Function: independent of ADL/iADLs - has not gotten his wheelchair yet  PCP: Dr. Ninfa Meeker  Substances: Denies any tobacco, alcohol, or illicit substance use   Family History: Reviewed  Allergies: Allergies as of 10/10/2023 - Review Complete 10/10/2023  Allergen Reaction Noted   Amiodarone Other (See Comments) 06/01/2022   Coreg [carvedilol] Shortness Of Breath and Diarrhea 07/07/2020   Heparin Other (See Comments) 03/05/2021   Metoprolol Other (See Comments) 08/04/2022   Amoxicillin Other (See Comments) 01/26/2023   Other Swelling and Other (See Comments) 01/26/2023   Tramadol  09/22/2023   Review of Systems: A complete ROS was negative except as per HPI.   OBJECTIVE:   Physical Exam: Blood pressure 110/70, pulse (!) 115, temperature 98.5 F (36.9 C), temperature source Oral, resp. rate (!) 29, height 6' (1.829 m), weight (!) 167.8 kg, SpO2 100%.   Constitutional: chronically ill-appearing, dyspneic, sitting up in bed, fatigued, in no acute distress HENT: normocephalic atraumatic, mucous membranes moist Cardiovascular: irregularly irregular Pulmonary/Chest: increased work of breathing on 4 L [employer], decreased air movement bilaterally Abdominal: soft, non-tender, non-distended MSK: normal bulk and tone Neurological: alert & oriented x 3 Skin: warm and dry  Labs: CBC    Component Value Date/Time   WBC 9.3 10/10/2023 1553   RBC 3.02 (L) 10/10/2023 1553   HGB 10.3 (L) 10/10/2023 1553   HGB 11.7 (L) 06/21/2023 1606   HCT 31.6 (L) 10/10/2023 1553   HCT  36.4 (L) 06/21/2023 1606   PLT 178 10/10/2023 1553   PLT 230 06/21/2023 1606   MCV 104.6 (H) 10/10/2023 1553   MCV 97 06/21/2023 1606   MCH 34.1 (H) 10/10/2023 1553   MCHC 32.6 10/10/2023 1553   RDW 17.6 (H) 10/10/2023 1553   RDW 14.3 06/21/2023 1606   LYMPHSABS 1.1 10/10/2023 1553   MONOABS 0.6 10/10/2023 1553   EOSABS 0.5 10/10/2023 1553   BASOSABS 0.1 10/10/2023 1553     CMP     Component Value Date/Time   NA 131 (L) 10/10/2023 1553   NA 134 06/21/2023 1606   K 4.6 10/10/2023 1553   CL 96 (L) 10/10/2023 1553   CO2 16 (L) 10/10/2023 1553   GLUCOSE 98 10/10/2023 1553   BUN 72 (H) 10/10/2023 1553   BUN 41 (H) 06/21/2023 1606   CREATININE 11.48 (H) 10/10/2023 1553   CREATININE 9.17 (H) 08/26/2021 1014   CALCIUM 9.0 10/10/2023  1553   PROT 6.8 09/07/2023 1452   PROT 7.5 09/15/2021 1147   ALBUMIN 3.2 (L) 09/29/2023 0446   ALBUMIN 4.3 09/15/2021 1147   AST 26 09/07/2023 1452   ALT 18 09/07/2023 1452   ALKPHOS 125 09/07/2023 1452   BILITOT 1.1 09/07/2023 1452   BILITOT 1.1 09/15/2021 1147   GFRNONAA 5 (L) 10/10/2023 1553   GFRAA >60 03/27/2020 1055    ASSESSMENT & PLAN:   Assessment & Plan by Problem: Principal Problem:   Acute on chronic hypoxic respiratory failure ([name])   Phillip Young is a 38 y.o. 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 Most likely volume overloaded leading to exacerbation of his chronic hypoxic respiratory failure. Low suspicion for infectious etiology as patient has been afebrile with no leukocytosis and no other significant infectious symptoms.  - HD per nephrology,  appreciate assistance - Digoxin level - Wean O2 as tolerated for SpO2 goal >90%  ESRD on HD AGMA Dyspnea and volume overload are most likely due to not having enough volume taken off during his last HD session due to difficulties with his catheter as well as missing today's session. AGMA will also likely resolve with HD.  - Nephrology consulted, appreciate assistance with HD needs. Will likely get HD tomorrow as he does not have urgent indication for dialysis. he is approximately at baseline respiratory status and is not hypokalemic - Trend BMP  Anemia of chronic disease Hgb stable at 10.3. No signs of blood loss. Continue to monitor  Paroxysmal atrial fibrillation - Telemetry monitoring - Continue home Eliquis 5mg  BID, Digoxin 0.125mg  MWF  Chronic somatic and neuropathic pain Chronic opiate therapy It has been attempted to switch away from chronic oxycodone therapy particularly with history of ESRD, however patient reports dilaudid has been less effective and requested continued oxycodone.  - Continue venlafaxine 37.5mg  daily, lyrica 50mg  daily, oxycodone 10mg  q6h prn.   Hypothyroidism Continue home levothyroxine  Depression Continue home venlafaxine 37.5mg ,   Diet: Renal VTE: DOAC IVF: None,None Code: Full  Prior to Admission Living Arrangement: Home, living with family Anticipated Discharge Location: Home Barriers to Discharge: acute hypoxic respiratory failure  Dispo: Admit patient to Inpatient with expected length of stay greater than 2 midnights.  Signed: Monna Fam, MD Internal Medicine Resident PGY-1  10/10/2023, 8:22 PM

## 2023-10-10 NOTE — Telephone Encounter (Signed)
 Copied from CRM [id number]. Topic: Clinical - Medication Refill >> Oct 10, 2023 10:09 AM Thomes Dinning wrote: Most Recent Primary Care Visit:  Provider: Derrell Lolling  Department: [facility]  Visit Type: MEDICARE AWV, INITIAL  Date: 10/06/2023  Medication:  Oxycodone HCl 10 MG TABS   Has the patient contacted their pharmacy? No (Agent: If no, request that the patient contact the pharmacy for the refill. If patient does not wish to contact the pharmacy document the reason why and proceed with request.) (Agent: If yes, when and what did the pharmacy advise?)  Is this the correct pharmacy for this prescription? Yes If no, delete pharmacy and type the correct one.  This is the patient's preferred pharmacy:  [id number] - [place], [place] - [address] AT [facility] [address] [place] [address] Phone: [phone] Fax: [phone]  [facility] [address] [place] [address] Phone: [phone] Fax: [phone]  [place] [facility] [address] [place] [address] Phone: [phone] Fax: [phone]  [facility]  - [place], [place] - [address] [place] [address] Phone: [phone] Fax: [phone]   Has the prescription been filled recently? Yes  Is the patient out of the medication? Yes  Has the patient been seen for an appointment in the last year OR does the patient have an upcoming appointment? Yes  Can we respond through MyChart? Yes  Agent: Please be advised that Rx refills may take up to 3 business days. We ask that you follow-up with your pharmacy.

## 2023-10-10 NOTE — Telephone Encounter (Signed)
 PCS forms to Dr. Ninfa Meeker for completion .

## 2023-10-10 NOTE — ED Triage Notes (Signed)
 BIB [facility] from home. Patient is a dialysis patient, goes for dialysis M,W,F. Did not go to dialysis today due to increased shortness of breath that has started since Wednesday and has not gotten better even after his last dialysis on 3/14. Patient stated that they only pulled 3L from him when normally they pull 6L due to the catheter not pulling.  Patient concerned of increased swelling in bilateral legs and feet and of his weeping under his abdominal folds and on his right thigh.

## 2023-10-11 ENCOUNTER — Ambulatory Visit (HOSPITAL_COMMUNITY)
Admission: RE | Admit: 2023-10-11 | Payer: Medicare Other | Source: Ambulatory Visit | Attending: Family Medicine | Admitting: Family Medicine

## 2023-10-11 ENCOUNTER — Other Ambulatory Visit: Payer: Self-pay

## 2023-10-11 ENCOUNTER — Encounter (HOSPITAL_COMMUNITY): Payer: Self-pay | Admitting: Internal Medicine

## 2023-10-11 DIAGNOSIS — Z992 Dependence on renal dialysis: Secondary | ICD-10-CM

## 2023-10-11 DIAGNOSIS — N186 End stage renal disease: Secondary | ICD-10-CM

## 2023-10-11 DIAGNOSIS — E8779 Other fluid overload: Secondary | ICD-10-CM | POA: Diagnosis not present

## 2023-10-11 DIAGNOSIS — I4892 Unspecified atrial flutter: Secondary | ICD-10-CM

## 2023-10-11 DIAGNOSIS — I5022 Chronic systolic (congestive) heart failure: Secondary | ICD-10-CM

## 2023-10-11 DIAGNOSIS — J9621 Acute and chronic respiratory failure with hypoxia: Secondary | ICD-10-CM | POA: Diagnosis not present

## 2023-10-11 DIAGNOSIS — I953 Hypotension of hemodialysis: Secondary | ICD-10-CM

## 2023-10-11 MED ORDER — HYDROMORPHONE HCL 2 MG PO TABS
1.0000 mg | ORAL_TABLET | Freq: Once | ORAL | Status: AC
  Administered 2023-10-11: 1 mg via ORAL
  Filled 2023-10-11: qty 1

## 2023-10-11 MED ORDER — MIDAZOLAM HCL 2 MG/2ML IJ SOLN
INTRAMUSCULAR | Status: AC
Start: 2023-10-11 — End: 2023-10-12
  Filled 2023-10-11: qty 2

## 2023-10-11 MED ORDER — OXYCODONE HCL 10 MG PO TABS: 10.0000 mg | ORAL_TABLET | Freq: Four times a day (QID) | ORAL | 0 refills | Status: DC | PRN

## 2023-10-11 MED ORDER — CHLORHEXIDINE GLUCONATE CLOTH 2 % EX PADS
6.0000 | MEDICATED_PAD | Freq: Every day | CUTANEOUS | Status: DC
Start: 2023-10-11 — End: 2023-10-11

## 2023-10-11 MED ORDER — ANTICOAGULANT SODIUM CITRATE 4% (200MG/5ML) IV SOLN
5.0000 mL | Freq: Once | Status: AC
Start: 2023-10-11 — End: 2023-10-11
  Administered 2023-10-11: 5 mL
  Filled 2023-10-11: qty 5

## 2023-10-11 MED ORDER — HYDROMORPHONE HCL 1 MG/ML IJ SOLN
1.0000 mg | Freq: Once | INTRAMUSCULAR | Status: AC
  Administered 2023-10-11: 1 mg via INTRAVENOUS
  Filled 2023-10-11: qty 1

## 2023-10-11 MED ORDER — CHLORHEXIDINE GLUCONATE CLOTH 2 % EX PADS
6.0000 | MEDICATED_PAD | Freq: Every day | CUTANEOUS | Status: DC
  Administered 2023-10-12 – 2023-10-18 (×7): 6 via TOPICAL

## 2023-10-11 MED ORDER — MIDAZOLAM HCL 2 MG/2ML IJ SOLN
INTRAMUSCULAR | Status: AC
  Filled 2023-10-11: qty 2

## 2023-10-11 MED ORDER — PNEUMOCOCCAL 20-VAL CONJ VACC 0.5 ML IM SUSY
0.5000 mL | PREFILLED_SYRINGE | INTRAMUSCULAR | Status: AC
  Administered 2023-10-12: 0.5 mL via INTRAMUSCULAR
  Filled 2023-10-11: qty 0.5

## 2023-10-11 NOTE — ED Notes (Addendum)
 Pt refused midorine.Pt encouraged to take medicine by RN. Pt still refused stating "it makes me feel ill". Pt educated by Modena Slater DO at bedside and encouraged to take midodrine. Pt still refused.

## 2023-10-11 NOTE — ED Notes (Addendum)
 RN called to room for removed cardiac leads and call bell. Pt hitting stick in room on equipment, waving stick with hook around. Pt gestured hookd stick towards RN stating "can you hear this". Security called to room.

## 2023-10-11 NOTE — Progress Notes (Signed)
   10/11/23 2311  Assess: MEWS Score  Temp 98.2 F (36.8 C)  BP (!) 123/102  MAP (mmHg) 110  Pulse Rate (!) 103  ECG Heart Rate (!) 109  Resp (!) 23  Level of Consciousness Alert  SpO2 100 %  O2 Device Nasal Cannula  O2 Flow Rate (L/min) 7 L/min  Assess: MEWS Score  MEWS Temp 0  MEWS Systolic 0  MEWS Pulse 1  MEWS RR 1  MEWS LOC 0  MEWS Score 2  MEWS Score Color Yellow  Assess: if the MEWS score is Yellow or Red  Were vital signs accurate and taken at a resting state? Yes  Does the patient meet 2 or more of the SIRS criteria? No  MEWS guidelines implemented  Yes, yellow  Treat  MEWS Interventions Considered administering scheduled or prn medications/treatments as ordered  Take Vital Signs  Increase Vital Sign Frequency  Yellow: Q2hr x1, continue Q4hrs until patient remains green for 12hrs  Escalate  MEWS: Escalate Yellow: Discuss with charge nurse and consider notifying provider and/or RRT  Notify: Charge Nurse/RN  Name of Charge Nurse/RN Best boy, RN  Assess: SIRS CRITERIA  SIRS Temperature  0  SIRS Respirations  1  SIRS Pulse 1  SIRS WBC 0  SIRS Score Sum  2

## 2023-10-11 NOTE — Consult Note (Signed)
 Renal Service Consult Note [facility]  Phillip Young 10/11/2023 Maree Krabbe, MD Requesting Physician: Dr. Monna Fam  Reason for Consult: ESRD patient with shortness of breath HPI: The patient is a 38 y.o. year-old w/ PMH as below who presented to ED yesterday evening complaining of shortness of breath and pain and swelling of the legs.  Patient was recently discharged on 3/6 and said he had not missed any dialysis sessions since.  Has a chronic cough which was unchanged no fever chest pain or belly pain.  In the ED heart rate was 110 blood pressure was normal patient was on 4 L [place] oxygen.  Chest x-ray showed interstitial edema and vascular congestion.  Patient was admitted.  We were asked to see for dialysis.   Pt seen in the dialysis unit.  Feeling a little bit better on dialysis now.  History as above.   PMH Chronic respiratory failure with hypoxia Severe obesity ESRD on HD Hypertension Hidradenitis suppurativa OSA Obesity hypoventilation syndrome Atrial fibrillation History of biventricular congestive heart failure History of amiodarone lung toxicity   ROS - denies CP, no joint pain, no HA, no blurry vision, no rash, no diarrhea, no nausea/ vomiting, no dysuria, no difficulty voiding   Past Medical History  Past Medical History:  Diagnosis Date   Acute on chronic respiratory failure with hypoxia ([name]) 04/21/2021   Acute on chronic systolic (congestive) heart failure ([name]) 02/26/2020   Amiodarone toxicity    Anemia    Atrial flutter ([name])    Biventricular congestive heart failure ([name])    Chronic hypoxemic respiratory failure ([name])    Class 3 severe obesity due to excess calories with serious comorbidity and body mass index (BMI) of 50.0 to 59.9 in adult ([name]) 02/26/2020   Essential hypertension 02/26/2020   GERD without esophagitis 02/26/2020   Hidradenitis suppurativa 02/26/2020   NICM (nonischemic cardiomyopathy) ([name])    Obesity  hypoventilation syndrome ([name])    OSA (obstructive sleep apnea)    PAF (paroxysmal atrial fibrillation) ([name])    Pneumonia    Prediabetes 02/26/2020   Past Surgical History  Past Surgical History:  Procedure Laterality Date   ABSCESS DRAINAGE     AV FISTULA PLACEMENT Left 08/21/2021   Procedure: LEFT ARM ARTERIOVENOUS (AV) FISTULA.;  Surgeon: Nada Libman, MD;  Location: [facility] OR;  Service: Vascular;  Laterality: Left;   CARDIAC CATHETERIZATION     CARDIOVERSION N/A 10/09/2021   Procedure: CARDIOVERSION;  Surgeon: Laurey Morale, MD;  Location: [facility] ENDOSCOPY;  Service: Cardiovascular;  Laterality: N/A;   CARDIOVERSION N/A 05/28/2022   Procedure: CARDIOVERSION;  Surgeon: Laurey Morale, MD;  Location: [facility] ENDOSCOPY;  Service: Cardiovascular;  Laterality: N/A;   CARDIOVERSION N/A 06/07/2022   Procedure: CARDIOVERSION;  Surgeon: Laurey Morale, MD;  Location: [facility] ENDOSCOPY;  Service: Cardiovascular;  Laterality: N/A;   IR FLUORO GUIDE CV LINE RIGHT  03/10/2021   IR FLUORO GUIDE CV LINE RIGHT  04/22/2021   IR FLUORO GUIDE CV LINE RIGHT  08/20/2021   IR FLUORO GUIDE CV LINE RIGHT  03/01/2023   IR FLUORO GUIDE CV LINE RIGHT  03/09/2023   IR FLUORO GUIDE CV LINE RIGHT  05/30/2023   IR PTA VENOUS EXCEPT DIALYSIS CIRCUIT  03/09/2023   IR REMOVAL TUN CV CATH W/O FL  05/26/2023   IR REMOVE CV FIBRIN SHEATH  03/09/2023   IR US GUIDE VASC ACCESS RIGHT  03/10/2021   IR US GUIDE VASC ACCESS RIGHT  04/22/2021  IR US GUIDE VASC ACCESS RIGHT  05/30/2023   RIGHT HEART CATH N/A 03/06/2021   Procedure: RIGHT HEART CATH;  Surgeon: Dolores Patty, MD;  [facility] INVASIVE CV LAB;  Service: Cardiovascular;  Laterality: N/A;   RIGHT/LEFT HEART CATH AND CORONARY ANGIOGRAPHY N/A 03/04/2020   Procedure: RIGHT/LEFT HEART CATH AND CORONARY ANGIOGRAPHY;  Surgeon: Laurey Morale, MD;  Location: [facility] INVASIVE CV LAB;  Service: Cardiovascular;  Laterality: N/A;   TEE WITHOUT CARDIOVERSION N/A 05/05/2021    Procedure: TRANSESOPHAGEAL ECHOCARDIOGRAM (TEE);  Surgeon: Laurey Morale, MD;  Location: [facility] ENDOSCOPY;  Service: Cardiovascular;  Laterality: N/A;   TEMPORARY DIALYSIS CATHETER  03/06/2021   Procedure: TEMPORARY DIALYSIS CATHETER;  Surgeon: Dolores Patty, MD;  [facility] INVASIVE CV LAB;  Service: Cardiovascular;;   Family History  Family History  Problem Relation Age of Onset   Heart disease Mother    Hypertension Mother    Pulmonary Hypertension Mother    Drug abuse Father        died due to Heroin overdose   Social History  reports that he quit smoking about 6 years ago. His smoking use included cigarettes. He has been exposed to tobacco smoke. He has quit using smokeless tobacco. He reports that he does not drink alcohol and does not use drugs. 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 Authorizing Provider  acetaminophen (TYLENOL) 500 MG tablet Take 2 tablets (1,000 mg total) by mouth every 6 (six) hours. 09/06/23  Yes Champ Mungo, DO  apixaban (ELIQUIS) 5 MG TABS tablet Take 1 tablet (5 mg total) by mouth 2 (two) times daily. 06/03/23  Yes [name], DO  Cholecalciferol (D3-1000) 25 MCG (1000 UT) tablet Take 500 Units by mouth daily.   Yes [provider]  cinacalcet (SENSIPAR) 30 MG tablet Take 1 tablet (30 mg total) by mouth every dialysis. Patient taking differently: Take 30 mg by mouth every Monday, Wednesday, and Friday.  09/12/23  Yes Lovie Macadamia, MD  clindamycin (CLINDAGEL) 1 % gel Apply topically 2 (two) times daily. 10/06/23  Yes [name], DO  Darbepoetin Alfa (ARANESP) 150 MCG/0.3ML SOSY injection To be given during dialysis by [facility] per Nephrology. Not to be given to patient at SNF. 09/06/23  Yes Lovie Macadamia, MD  digoxin (LANOXIN) 0.125 MG tablet Take 1 tablet (0.125 mg total) by mouth 3 (three) times a week. Monday, Wednesday, Friday (dialysis days) 07/22/23  Yes Rocky Morel, DO  levothyroxine (SYNTHROID) 25 MCG tablet Take 1 tablet (25 mcg total) by mouth daily at 6 (six) AM. 07/21/23  Yes Rocky Morel, DO  midodrine (PROAMATINE) 10 MG tablet Take 3 tablets (30 mg total) by mouth 3 (three) times daily with meals. 06/20/23  Yes Setzer, Lynnell Jude, PA-C  Oxycodone HCl 10 MG TABS Take 1 tablet (10 mg total) by mouth every 6 (six) hours as needed. 10/05/23  Yes  [name], DO  pregabalin (LYRICA) 50 MG capsule Take 1 capsule (50 mg total) by mouth daily. Take one capsule daily. 07/25/23  Yes Champ Mungo, DO  triamcinolone ointment (KENALOG) 0.1 % Apply topically 2 (two) times daily. 09/12/23  Yes Lovie Macadamia, MD  venlafaxine XR (EFFEXOR XR) 37.5 MG 24 hr capsule Take 1 capsule (37.5 mg total) by mouth daily. 07/07/23 07/06/24 Yes Juberg, Cristal Deer, DO  naloxone [facility]) 2 MG/2ML injection Inject 2 mg into the skin as needed.    [provider]  sevelamer carbonate (RENVELA) 800 MG tablet Take 2 tablets (1,600 mg total) by mouth 3 (three) times daily with meals. Patient not taking: Reported on 10/11/2023 10/06/23   [name], DO  tirzepatide [facility]) 2.5 MG/0.5ML injection vial Inject 2.5 mg into the skin once a week. Patient not taking: Reported on 10/11/2023 08/25/23   Lovie Macadamia, MD     Vitals:   10/11/23 0800 10/11/23 0907 10/11/23 0921 10/11/23 0942  BP: 98/83 99/61 (!) 141/101   Pulse: 72 (!) 122 (!) 119   Resp: (!)  24 (!) 25 (!) 26 (!) 22  Temp:      TempSrc:      SpO2: (!) 84% 100% (!) 62%   Weight:      Height:       Exam Gen alert, no distress, nasal oxygen No rash, cyanosis or gangrene Sclera anicteric, throat clear  No jvd or bruits Chest clear bilat to bases, no rales/ wheezing RRR no MRG Abd soft ntnd no mass or ascites +bs GU deferred MS no joint effusions or deformity Ext 1-2+ LE edema, no other edema Neuro is alert, Ox 3 , nf    TDC intact      Renal-related home meds: Sensipar 30 mg MWF Midodrine 30 mg 3 times daily Renvela 1600 mg 3 times daily AC Others: Venlafaxine, Lyrica, oxycodone IR, Synthroid, digoxin, Eliquis, Narcan as needed, Zepbound    OP HD: MWF [facility] Needs updating --> 4.5H B400    155.5 kg   2.0 bath TDC   No heparin (history of HIT) -Patient does not accept blood products    Assessment/ Plan: Acute on chronic hypoxic respiratory failure: With pulmonary edema on chest x-ray.  Volume excess on exam with lower extremity edema.  Plan is for dialysis which is in progress this morning ESRD - on HD MWF.  HD as above. Plan next HD Wed.  BP -chronic hypotension on high-dose midodrine at home.  Continue. Volume -volume overload with 1-2+ bilateral lower extremity edema.  Get weights after dialysis if possible. Anemia of esrd -hemoglobin 10-11 here, follow.  Get outpatient ESA records. Secondary hyperparathyroidism -calcium in range, Phos a bit high.  Continue binders with meals Chronic pain syndrome Atrial fibrillation      Vinson Moselle  MD [facility] 10/11/2023, 10:34 AM  Recent Labs  Lab 10/10/23 1553 10/10/23 2030  HGB 10.3*  --   CALCIUM 9.0  --   PHOS  --  7.1*  CREATININE 11.48*  --   K 4.6  --    Inpatient medications:  acetaminophen  1,000 mg Oral Q6H   apixaban  5 mg Oral BID   Chlorhexidine Gluconate Cloth  6 each Topical Q0600   [START ON 10/12/2023] digoxin  0.125 mg Oral Once per day on Monday Wednesday Friday   levothyroxine  25 mcg Oral  Q0600   midodrine  30 mg Oral TID WC   pregabalin  50 mg Oral Daily  sevelamer carbonate  1,600 mg Oral TID WC   venlafaxine XR  37.5 mg Oral Daily    [START ON 10/12/2023] cinacalcet, oxyCODONE

## 2023-10-11 NOTE — Progress Notes (Signed)
 Pt receives out-pt HD at [facility] on MWF. Will assist as needed.   Olivia Canter Renal Navigator [phone]

## 2023-10-11 NOTE — Procedures (Signed)
 I was present at the procedure, reviewed the HD regimen and made appropriate changes.   Vinson Moselle MD  [facility] 10/11/2023, 11:09 AM

## 2023-10-11 NOTE — Progress Notes (Signed)
 PT Cancellation Note  Patient Details Name: Phillip Young MRN: [id number] DOB: March 28, 1986   Cancelled Treatment:    Reason Eval/Treat Not Completed: Patient at procedure or test/unavailable. Will check back as time allows.   Lyanne Co, PT  Acute Rehab Services Secure chat preferred Office [phone]    Elyse Hsu 10/11/2023, 9:40 AM

## 2023-10-11 NOTE — Progress Notes (Signed)
 HD#1 SUBJECTIVE:  Patient Summary:Phillip Young is a 38 yo male with chronic hypoxic respiratory failure on 2-4L home O2, HFrEF, Afib on Eliquis, chronic pain, ESRD on HD who presented to the ED with shortness of breath after missing dialysis session admitted for [facility].     Overnight Events: None  Interim History: He is evaluated while he is receiving dialysis session, he reports muscle cramping and abdominal pain.  He denies any nausea.  Appears uncomfortable, in pain.  Denies any shortness of breath.  Reports lower extremity swelling and abdominal swelling.  OBJECTIVE:  Vital Signs: Vitals:   10/11/23 0921 10/11/23 0942 10/11/23 1200 10/11/23 1223  BP: (!) 141/101 (!) 78/65  (!) 110/94  Pulse: (!) 119   (!) 111  Resp: (!) 26 (!) 22  (!) 23  Temp:   98 F (36.7 C)   TempSrc:      SpO2: (!) 62%   93%  Weight:      Height:       Supplemental O2: Nasal Cannula SpO2: 93 % O2 Flow Rate (L/min): 6 L/min  Filed Weights   10/10/23 1514  Weight: (!) 167.8 kg     Intake/Output Summary (Last 24 hours) at 10/11/2023 1401 Last data filed at 10/11/2023 0102 Gross per 24 hour  Intake --  Output 3900 ml  Net -3900 ml   Net IO Since Admission: -3,900 mL [10/11/23 1401]  Physical Exam: Physical Exam General: Obese, appears in pain Cardiovascular: Regular rate Pulmonary: Increased work of breathing Abdomen: Soft, nontender, nondistended, bowel sounds present MSK: 2+ pitting edema up to knees bilaterally  Skin: Warm  Patient Lines/Drains/Airways Status     Active Line/Drains/Airways     Name Placement date Placement time Site Days   Peripheral IV 10/10/23 22 G Posterior;Right Forearm 10/10/23  --  Forearm  1   Hemodialysis Catheter Right Internal jugular Double lumen Permanent (Tunneled) 05/30/23  0856  Internal jugular  134   Wound / Incision (Open or Dehisced) 08/31/23 Other (Comment) Thigh Left;Posterior partial thickness blisters 08/31/23  --  Thigh  41   Wound /  Incision (Open or Dehisced) 09/22/23 Non-pressure wound Toe (Comment  which one) Right R great toenail removed 09/22/23  1930  Toe (Comment  which one)  19             ASSESSMENT/PLAN:  Assessment: Principal Problem:   Acute on chronic hypoxic respiratory failure ([name]) Active Problems:   Morbid obesity ([name])   Chronic systolic CHF (congestive heart failure) ([name])   Chronic respiratory failure with hypoxia ([name])   Paroxysmal atrial flutter ([name])   ESRD (end stage renal disease) on dialysis ([name])   Hypotension   Hypervolemia   Type 2 diabetes mellitus with diabetic chronic kidney disease ([name])   Plan: Acute on chronic hypoxic respiratory failure  Pulmonary edema Hx presumed amiodarone toxicity HFrEF, LVEF <20% ESRD on HD  Hyperphosphatemia  AGMA Symptoms of dyspnea with increased oxygen requirement and weight gain in the setting of missed/incomplete HD sessions due to difficulties with his catheter as well as missing today's session. Most likely volume overloaded leading to exacerbation of his chronic hypoxic respiratory failure. Nephrology consulted by the EDP, received HD dialysis this AM, reported muscle cramping.  - Continue HD dialysis sessions. Next schedule don Wednesday - Continue digoxin 0.125 mg 3 times daily on Monday, Wednesday, Friday - Continue Renvela 1600 mg 3 times daily with meals - Continue Cinacelet 30 mg daily on dialysis days  Hypotension Likely due  to above with underlying vasoplegia. - Continue home medication midodrine 30 mg TID    Anemia of chronic disease Hgb stable at 10.3. No signs of blood loss. Continue to monitor   Paroxysmal atrial fibrillation - Telemetry monitoring - Continue home Eliquis 5mg  BID, Digoxin 0.125mg  MWF  Hyponatremia Na 131 (133) likely due to hypervolemia  - Trend   Anemia, Macrocytic Hgb 10.3, stable    Chronic somatic and neuropathic pain Chronic opiate therapy It has been attempted to switch away from chronic  oxycodone therapy particularly with history of ESRD, however patient reports dilaudid has been less effective and requested continued oxycodone.  - Continue venlafaxine 37.5mg  daily, lyrica 50mg  daily, oxycodone 10mg  q6h prn.  - Continue Tylenol 1000 mg every 6 hours for pain control   Hypothyroidism Continue home levothyroxine   Depression Continue home venlafaxine 37.5mg ,   Best Practice: Diet: Renal with fluid restrictions  IVF: Fluids: None, VTE: Eliquis 5 mg BID Code: Full AB: None Therapy Recs: Pending DISPO: Anticipated discharge  3-4 days  to pending PT/OT   Signature: Jeral Pinch, D.O.  Internal Medicine Resident, PGY-1 [facility]  Pager: [phone] 2:01 PM, 10/11/2023   Please contact the on call pager after 5 pm and on weekends at [phone].

## 2023-10-11 NOTE — ED Notes (Addendum)
 While doing the admission, pt made comments to Thurston Hole RN about how "if someone takes his things he will hit them". Pt told that we question them, to list someone belonging  as part of  the admission process. Pt also stated if someone gives him a "if someone gives me a shot to hard Its my right to hit them back". Pt then asked RN if she would get him a fork, RN felt unsafe and left room to discuss concerns with primary RN. Pt kept mother on phone listening to conversation, Jonette Mate not aware at time until leaving room.

## 2023-10-11 NOTE — ED Notes (Signed)
 Pt leaving for dialysis at this time Larene Pickett, HD RN given report. Left upstairs via transport staff

## 2023-10-11 NOTE — Progress Notes (Signed)
   10/11/23 2130  Vitals  Pulse Rate (!) 122  Resp (!) 25  BP 99/61  SpO2 100 %  O2 Device Room Air  Oxygen Therapy  O2 Flow Rate (L/min) 6 L/min  Patient Activity (if Appropriate) In bed  Pulse Oximetry Type Continuous  Oximetry Probe Site Changed No  During Treatment Monitoring  Blood Flow Rate (mL/min) 0 mL/min  Arterial Pressure (mmHg) -27.07 mmHg  Venous Pressure (mmHg) 79.59 mmHg  TMP (mmHg) 4.85 mmHg  Ultrafiltration Rate (mL/min) 0 mL/min  Dialysate Flow Rate (mL/min) 300 ml/min  Dialysate Potassium Concentration 2  Dialysate Calcium Concentration 2.5  Duration of HD Treatment -hour(s) 3.06 hour(s)  Cumulative Fluid Removed (mL) per Treatment  3876.27  HD Safety Checks Performed Yes  Intra-Hemodialysis Comments Tx completed  Dialysis Fluid Bolus Normal Saline  Bolus Amount (mL) 200 mL   Received patient in bed to unit.  Alert and oriented.  Informed consent signed and in chart.   TX duration: 3.5 hours  Pt complained of cramping and requested to have fluid removal stopped. Fluid removal was stopped pt given bolus of 200 liters. Patient requested to stop dialysis and formed signed and placed in chart.  Transported back to the room  Alert, without acute distress.  Hand-off given to patient's nurse.   Access used: Right IJ Access issues: None  Total UF removed: 3900 Medication(s) given: See Wanda Plump, LPN  [facility]

## 2023-10-11 NOTE — ED Notes (Signed)
 Could not assess legs pts legs too sensitive to touch.

## 2023-10-12 DIAGNOSIS — S31000A Unspecified open wound of lower back and pelvis without penetration into retroperitoneum, initial encounter: Secondary | ICD-10-CM

## 2023-10-12 DIAGNOSIS — N186 End stage renal disease: Secondary | ICD-10-CM | POA: Diagnosis not present

## 2023-10-12 DIAGNOSIS — E8779 Other fluid overload: Secondary | ICD-10-CM | POA: Diagnosis not present

## 2023-10-12 DIAGNOSIS — I4892 Unspecified atrial flutter: Secondary | ICD-10-CM | POA: Diagnosis not present

## 2023-10-12 DIAGNOSIS — J9621 Acute and chronic respiratory failure with hypoxia: Secondary | ICD-10-CM | POA: Diagnosis not present

## 2023-10-12 LAB — RENAL FUNCTION PANEL
Albumin: 3.2 g/dL — ABNORMAL LOW (ref 3.5–5.0)
Anion gap: 15 (ref 5–15)
BUN: 61 mg/dL — ABNORMAL HIGH (ref 6–20)
CO2: 21 mmol/L — ABNORMAL LOW (ref 22–32)
Calcium: 9.2 mg/dL (ref 8.9–10.3)
Chloride: 93 mmol/L — ABNORMAL LOW (ref 98–111)
Creatinine, Ser: 9.98 mg/dL — ABNORMAL HIGH (ref 0.61–1.24)
GFR, Estimated: 6 mL/min — ABNORMAL LOW (ref >60)
Glucose, Bld: 95 mg/dL (ref 70–99)
Phosphorus: 7.9 mg/dL — ABNORMAL HIGH (ref 2.5–4.6)
Potassium: 4.7 mmol/L (ref 3.5–5.1)
Sodium: 129 mmol/L — ABNORMAL LOW (ref 135–145)

## 2023-10-12 LAB — CBC
HCT: 34 % — ABNORMAL LOW (ref 39.0–52.0)
Hemoglobin: 11.2 g/dL — ABNORMAL LOW (ref 13.0–17.0)
MCH: 33.9 pg (ref 26.0–34.0)
MCHC: 32.9 g/dL (ref 30.0–36.0)
MCV: 103 fL — ABNORMAL HIGH (ref 80.0–100.0)
Platelets: 199 10*3/uL (ref 150–400)
RBC: 3.3 MIL/uL — ABNORMAL LOW (ref 4.22–5.81)
RDW: 17.7 % — ABNORMAL HIGH (ref 11.5–15.5)
WBC: 7.6 10*3/uL (ref 4.0–10.5)
nRBC: 0 % (ref 0.0–0.2)

## 2023-10-12 LAB — MRSA NEXT GEN BY PCR, NASAL: MRSA by PCR Next Gen: NOT DETECTED

## 2023-10-12 LAB — HEPATITIS B SURFACE ANTIGEN: Hepatitis B Surface Ag: NONREACTIVE

## 2023-10-12 MED ORDER — HYDROMORPHONE HCL 2 MG PO TABS
1.0000 mg | ORAL_TABLET | Freq: Once | ORAL | Status: AC
  Administered 2023-10-12: 1 mg via ORAL
  Filled 2023-10-12: qty 1

## 2023-10-12 MED ORDER — CLINDAMYCIN PHOSPHATE 1 % EX GEL
Freq: Two times a day (BID) | CUTANEOUS | Status: DC
Start: 2023-10-12 — End: 2023-10-24
  Administered 2023-10-23: 1 via TOPICAL
  Filled 2023-10-12 (×2): qty 30

## 2023-10-12 NOTE — Evaluation (Signed)
 Occupational Therapy Evaluation Patient Details Name: Phillip Young MRN: [id number] DOB: 01/01/1986 Today's Date: 10/12/2023   History of Present Illness   Patient is 38 year old male who presented 10/10/23 with edema, dyspnea, weight gain and missed HD.  PMH: morbid obesity, HTN, prediabetes, OSA on home BiPAP, chronic respirator failure on 2-4L home oxygen, chronic systolic heart failure due to NICM, ESRD, and PAF.     Clinical Impressions Pt at this time was agreeable to session even though they just went into supine from breakfast. Pt reported they want to be as independent as possible as it has been difficult since they did not have any aides at home prior to this admission. Pt at this time with increase in time was able to complete supine to sitting with HOB elevated and bed rails with min guard. Pt then completed while sitting at EOB a sponge bath and was able to lean posteriorly to clean peri area but then required mod x2 to go back into sitting and then required assist to bath back. He then required total assist with BLE sitting to standing. Pt was able to complete sit to stand transfers with bed in almost a standing position with min-CGA x2. He then was able to take some side steps to the [facility] with 4WW with min guard -min assist x2. Pt then requested to remain sitting at the EOB however when communicating to the patient they would start to shut their eyes in conversation and even reported they feel "drowsy" but declined to go back into supine. Pt was educated about the risk of falling of the EOB when drowsy but reported he had never fallen off the EOB and wanted to remain in this position. [name] bed alarm was set on however even with the smallest movement it was setting off and was going to attempt to place chair alarm under pt but decline. Md at this time reported to just keep alarms off at this time. Patient will benefit from intensive inpatient follow-up therapy, >3 hours/day.     If plan is  discharge home, recommend the following:   Two people to help with walking and/or transfers;Two people to help with bathing/dressing/bathroom;Assistance with cooking/housework;Assist for transportation;Help with stairs or ramp for entrance     Functional Status Assessment   Patient has had a recent decline in their functional status and demonstrates the ability to make significant improvements in function in a reasonable and predictable amount of time.     Equipment Recommendations    (TBD)     Recommendations for Other Services   Rehab consult     Precautions/Restrictions   Precautions Precautions: Fall Recall of Precautions/Restrictions: Impaired Precaution/Restrictions Comments: watch o2/HR Restrictions Weight Bearing Restrictions Per Provider Order: No     Mobility Bed Mobility Overal bed mobility: Needs Assistance Bed Mobility: Supine to Sit     Supine to sit: Contact guard, HOB elevated, Used rails          Transfers Overall transfer level: Needs assistance Equipment used: Rollator (4 wheels) Transfers: Sit to/from Stand Sit to Stand: Contact guard assist, +2 physical assistance, +2 safety/equipment           General transfer comment: Pt has bed almost in a complete standing position to complete transfer and then sit sitting they wanted to have bed lowered down and noted decrease in ability to complete descent to bed.      Balance Overall balance assessment: Needs assistance Sitting-balance support: Feet supported Sitting balance-Leahy Scale: Good Sitting  balance - Comments: Pt is able to sit at the EOB but also noted that they started to dose off in session and this is when pt was recomended to go back into supine.   Standing balance support: Bilateral upper extremity supported Standing balance-Leahy Scale: Poor Standing balance comment: Reliant on RW and was able to bend forward to assist to clean for peri care.                            ADL either performed or assessed with clinical judgement   ADL Overall ADL's : Needs assistance/impaired Eating/Feeding: Independent;Sitting   Grooming: Wash/dry hands;Wash/dry face;Oral care;Set up;Sitting   Upper Body Bathing: Minimal assistance;Sitting (on backside)   Lower Body Bathing: Maximal assistance;Sit to/from stand;Sitting/lateral leans Lower Body Bathing Details (indicate cue type and reason): Pt was able to lean back in bed to clean peri area but unable to clean BLE Upper Body Dressing : Contact guard assist;Sitting   Lower Body Dressing: Maximal assistance;Sit to/from stand;Sitting/lateral leans   Toilet Transfer: Contact guard assist;+2 for physical assistance;+2 for safety/equipment;Rollator (4 wheels)   Toileting- Clothing Manipulation and Hygiene: Total assistance;Sit to/from stand       Functional mobility during ADLs: Contact guard assist;+2 for physical assistance;+2 for safety/equipment;Rollator (4 wheels);Minimal assistance General ADL Comments: only at EOB     Vision Baseline Vision/History: 0 No visual deficits Ability to See in Adequate Light: 0 Adequate Patient Visual Report: No change from baseline Vision Assessment?: No apparent visual deficits     Perception Perception: Within Functional Limits       Praxis Praxis: WFL       Pertinent Vitals/Pain Pain Assessment Pain Assessment: 0-10 Pain Score: 10-Worst pain ever Pain Location: BLE Pain Descriptors / Indicators: Discomfort, Heaviness, Pressure Pain Intervention(s): Limited activity within patient's tolerance, Monitored during session, RN gave pain meds during session, Repositioned     Extremity/Trunk Assessment Upper Extremity Assessment Upper Extremity Assessment: Generalized weakness   Lower Extremity Assessment Lower Extremity Assessment: Defer to PT evaluation   Cervical / Trunk Assessment Cervical / Trunk Assessment: Normal   Communication Communication Factors  Affecting Communication:  (Pt reported that they have difficulties with speach due to damage voice box and speaked softly until the end of the session.)   Cognition Arousal: Alert                 OT - Cognition Comments: Pt was very pleasant through the entired session until the end when attempting to have the pt to go back into supine as they were starting to dose off in session and asked about going back into supine. Pt decline even though they reported about they are "drowsy" and they may "close their eyes".                 Following commands: Intact       Cueing  General Comments   Cueing Techniques: Verbal cues      Exercises     Shoulder Instructions      Home Living Family/patient expects to be discharged to:: Private residence Living Arrangements: Parent Available Help at Discharge: Family;Available 24 hours/day Type of Home: Apartment Home Access: Level entry Entrance Stairs-Number of Steps: level entrance at the back Entrance Stairs-Rails: Right;Left;Can reach both Home Layout: One level     Bathroom Shower/Tub: Chief Strategy Officer: Handicapped height Bathroom Accessibility: Yes How Accessible: Accessible via wheelchair Home Equipment: Wheelchair - manual;Rolling  Walker (2 wheels);BSC/3in1;Hospital bed;Shower seat;Other (comment)   Additional Comments: Recieving HH nursing care following return home from SNF rehab. However, recently did not have any aides and reported a decline in particiaption to ADLS. Pt then also requires to get up a hill to be able to enter/exit the home      Prior Functioning/Environment Prior Level of Function : Needs assist             Mobility Comments: Pt reported they would complete stand transfers to [facility] and stadning tolerance of 30 seconds. ADLs Comments: Pt reported they would try to complete what they could    OT Problem List: Decreased strength;Decreased range of motion;Decreased activity  tolerance;Impaired balance (sitting and/or standing);Decreased safety awareness;Decreased knowledge of use of DME or AE;Cardiopulmonary status limiting activity;Pain   OT Treatment/Interventions: Self-care/ADL training;Therapeutic exercise;Energy conservation;DME and/or AE instruction;Therapeutic activities;Patient/family education;Balance training      OT Goals(Current goals can be found in the care plan section)   Acute Rehab OT Goals Patient Stated Goal: to be independent OT Goal Formulation: With patient Time For Goal Achievement: 10/26/23 Potential to Achieve Goals: Good   OT Frequency:  Min 2X/week    Co-evaluation PT/OT/SLP Co-Evaluation/Treatment: Yes Reason for Co-Treatment: Complexity of the patient's impairments (multi-system involvement);Necessary to address cognition/behavior during functional activity;For patient/therapist safety   OT goals addressed during session: ADL's and self-care;Proper use of Adaptive equipment and DME;Strengthening/ROM      AM-PAC OT "6 Clicks" Daily Activity     Outcome Measure Help from another person eating meals?: None Help from another person taking care of personal grooming?: A Little Help from another person toileting, which includes using toliet, bedpan, or urinal?: A Lot Help from another person bathing (including washing, rinsing, drying)?: A Lot Help from another person to put on and taking off regular upper body clothing?: A Little Help from another person to put on and taking off regular lower body clothing?: A Lot 6 Click Score: 16   End of Session Equipment Utilized During Treatment: Gait belt;Rollator (4 wheels) Nurse Communication: Mobility status  Activity Tolerance: Patient limited by fatigue Patient left: in bed;with call bell/phone within reach  OT Visit Diagnosis: Unsteadiness on feet (R26.81);Other abnormalities of gait and mobility (R26.89);Muscle weakness (generalized) (M62.81);Pain Pain - Right/Left:  (BLE)                 Time: 4098-1191 OT Time Calculation (min): 65 min Charges:  OT General Charges $OT Visit: 1 Visit OT Evaluation $OT Eval Moderate Complexity: 1 Mod OT Treatments $Self Care/Home Management : 8-22 mins  Presley Raddle OTR/L  Acute Rehab Services  [phone] office number   Alphia Moh 10/12/2023, 10:04 AM

## 2023-10-12 NOTE — Progress Notes (Signed)
 [facility] Progress Note   Subjective:    Seen and examined patient at bedside. Noted he signed off early from HD yesterday 2nd cramping. Noted net uF 3.9L. Seen sitting up at side of bed. (+) LE edema. Next HD 3/20 and continue to push UF as tolerated.  Objective Vitals:   10/12/23 0515 10/12/23 1005 10/12/23 1055 10/12/23 1215  BP: 120/85 105/78 107/85 (!) 118/93  Pulse:  (!) 118 (!) 109 (!) 114  Resp: (!) 23 20 19 20   Temp: 97.7 F (36.5 C) 98.3 F (36.8 C) 98.2 F (36.8 C) 98.2 F (36.8 C)  TempSrc: Oral Oral Oral Oral  SpO2:  100% 98% 98%  Weight:      Height:       Physical Exam Gen alert, no distress, nasal oxygen Sclera anicteric, throat clear  No jvd or bruits Chest clear bilat to bases, no rales/ wheezing RRR no MRG Abd soft ntnd no mass or ascites +bs Ext 1-2+ LE edema, no other edema Neuro is alert, Ox 3 , nf TDC intact  Filed Weights   10/10/23 1514  Weight: (!) 167.8 kg    Intake/Output Summary (Last 24 hours) at 10/12/2023 1726 Last data filed at 10/12/2023 1004 Gross per 24 hour  Intake 946 ml  Output --  Net 946 ml    Additional Objective Labs: Basic Metabolic Panel: Recent Labs  Lab 10/10/23 1553 10/10/23 2030 10/12/23 0402  NA 131*  --  129*  K 4.6  --  4.7  CL 96*  --  93*  CO2 16*  --  21*  GLUCOSE 98  --  95  BUN 72*  --  61*  CREATININE 11.48*  --  9.98*  CALCIUM 9.0  --  9.2  PHOS  --  7.1* 7.9*   Liver Function Tests: Recent Labs  Lab 10/12/23 0402  ALBUMIN 3.2*   No results for input(s): "LIPASE", "AMYLASE" in the last 168 hours. CBC: Recent Labs  Lab 10/10/23 1553 10/12/23 0402  WBC 9.3 7.6  NEUTROABS 6.9  --   HGB 10.3* 11.2*  HCT 31.6* 34.0*  MCV 104.6* 103.0*  PLT 178 199   Blood Culture    Component Value Date/Time   SDES BLOOD BLOOD RIGHT HAND 05/27/2023 0959   SPECREQUEST  05/27/2023 0959    BOTTLES DRAWN AEROBIC AND ANAEROBIC Blood Culture adequate volume   CULT  05/27/2023 0959     NO GROWTH 5 DAYS Performed at [facility], [address] [place], [place] [address]    REPTSTATUS 06/01/2023 FINAL 05/27/2023 1914    Cardiac Enzymes: No results for input(s): "CKTOTAL", "CKMB", "CKMBINDEX", "TROPONINI" in the last 168 hours. CBG: No results for input(s): "GLUCAP" in the last 168 hours. Iron Studies: No results for input(s): "IRON", "TIBC", "TRANSFERRIN", "FERRITIN" in the last 72 hours. Lab Results  Component Value Date   INR 2.0 (H) 06/10/2022   INR 2.8 (H) 06/09/2022   INR 2.9 (H) 06/07/2022   Studies/Results: No results found.  Medications:   acetaminophen  1,000 mg Oral Q6H   apixaban  5 mg Oral BID   Chlorhexidine Gluconate Cloth  6 each Topical Q0600   clindamycin   Topical BID   digoxin  0.125 mg Oral Once per day on Monday Wednesday Friday   levothyroxine  25 mcg Oral Q0600   midodrine  30 mg Oral TID WC   pregabalin  50 mg Oral Daily   sevelamer carbonate  1,600 mg Oral TID  WC   venlafaxine XR  37.5 mg Oral Daily    Dialysis Orders: MWF [facility] Needs updating --> 4.5H B400    155.5 kg   2.0 bath TDC   No heparin (history of HIT) -Patient does not accept blood products  Renal-related home meds: Sensipar 30 mg MWF Midodrine 30 mg 3 times daily Renvela 1600 mg 3 times daily AC Others: Venlafaxine, Lyrica, oxycodone IR, Synthroid, digoxin, Eliquis, Narcan as needed, Zepbound    Assessment/Plan: Acute on chronic hypoxic respiratory failure: With pulmonary edema on chest x-ray.  Volume excess on exam with lower extremity edema.  See below ESRD - on HD MWF.  Received HD 3/18 and 3.9L removed. Next HD 3/20. BP -chronic hypotension on high-dose midodrine at home.  Continue. Volume -volume overload with 1-2+ bilateral lower extremity edema.  Get weights after dialysis if possible. Anemia of esrd -hemoglobin 10-11 here, follow.  Get outpatient ESA records. Secondary hyperparathyroidism -calcium in range, Phos a bit high.  Continue binders  with meals Chronic pain syndrome Atrial fibrillation  Salome Holmes, NP [facility] 10/12/2023,5:26 PM  LOS: 2 days

## 2023-10-12 NOTE — Evaluation (Signed)
 Physical Therapy Evaluation Patient Details Name: Phillip Young MRN: [id number] DOB: April 02, 1986 Today's Date: 10/12/2023  History of Present Illness  Patient is 38 year old male who presented 10/10/23 with edema, dyspnea, weight gain and missed HD.  PMH: morbid obesity, HTN, prediabetes, OSA on home BiPAP, chronic respirator failure on 2-4L home oxygen, chronic systolic heart failure due to NICM, ESRD, and PAF.  Clinical Impression  Pt is a 38 y.o. male presenting on 10/10/23 with above conditions and deficits below, see PT Problem List. PTA pt lived in an apartment with his mother, used a w/c for mobility, and had a home aid that helped him with ADLs. Currently pt is CGA for bed mobility and transfers, and doesn't walk much at baseline. Pt displays deficits in LE strength and AROM, functional mobility, transfers, gait, safety awareness, activity tolerance, and dynamic/static standing balance and would benefit from continued acute PT to address these impairments. Pt displays decreased safety awareness as he refused to lay supine in the bed at the EOS after reporting and displaying s/sxs consistent with drowsiness from narcotics. Pt educated on the safety precautions and offered alternatives to which pt responded with verbal understanding but still refusing to return to supine or use of chair alarm pad in the bed, stating "he has never had a fall in the hospital and he understands about safety." Given the pt's available assistance, motivation to improve, and current condition, he would benefit from intense rehab > 3 hours upon discharge. Pt would benefit from continued mobility with nurses and mobility specialists until d/c. Will continue to follow acutely.            If plan is discharge home, recommend the following: A lot of help with walking and/or transfers;A little help with bathing/dressing/bathroom;Assistance with cooking/housework;Assist for transportation;Help with stairs or ramp for entrance    Can travel by private vehicle   Yes    Equipment Recommendations Rollator (4 wheels) (bariatric - per pt request)  Recommendations for Other Services  Rehab consult    Functional Status Assessment Patient has had a recent decline in their functional status and demonstrates the ability to make significant improvements in function in a reasonable and predictable amount of time.     Precautions / Restrictions Precautions Precautions: Fall Recall of Precautions/Restrictions: Impaired Precaution/Restrictions Comments: watch o2/HR Restrictions Weight Bearing Restrictions Per Provider Order: No      Mobility  Bed Mobility Overal bed mobility: Needs Assistance Bed Mobility: Supine to Sit     Supine to sit: Contact guard, HOB elevated, Used rails     General bed mobility comments: pt performs with increased time    Transfers Overall transfer level: Needs assistance Equipment used: Rollator (4 wheels) Transfers: Sit to/from Stand Sit to Stand: Contact guard assist, +2 physical assistance, +2 safety/equipment, From elevated surface   Step pivot transfers: Min assist, +2 safety/equipment       General transfer comment: Required elevated bed to stand and lowest bed height to sit. Requires increased time to stand and increased trunk flexion. Min A for step-pivot pt able to side step to the [facility] with rollator. Cueing for rollator management and positioning.    Ambulation/Gait               General Gait Details: deferred s/t pt fatigue and LE pain  Stairs            Wheelchair Mobility     Tilt Bed    Modified Rankin (Stroke Patients Only)  Balance Overall balance assessment: Needs assistance Sitting-balance support: Feet supported Sitting balance-Leahy Scale: Good     Standing balance support: Bilateral upper extremity supported, During functional activity, Reliant on assistive device for balance Standing balance-Leahy Scale: Poor Standing  balance comment: reliant on rollator. No LOB                             Pertinent Vitals/Pain Pain Assessment Pain Assessment: 0-10 Pain Score: 10-Worst pain ever Pain Location: BLE Pain Descriptors / Indicators: Discomfort, Heaviness, Pressure Pain Intervention(s): Monitored during session, Limited activity within patient's tolerance    Home Living Family/patient expects to be discharged to:: Private residence Living Arrangements: Parent Available Help at Discharge: Family;Available 24 hours/day Type of Home: Apartment Home Access: Level entry Entrance Stairs-Rails: Right;Left;Can reach both Entrance Stairs-Number of Steps: level entrance at the back   Home Layout: One level Home Equipment: Wheelchair - Forensic psychologist (2 wheels);BSC/3in1;Hospital bed;Shower seat;Other (comment) Additional Comments: Recieving HH nursing care following return home from SNF rehab. However, recently did not have any aides and reported a decline in particiaption to ADLS. Pt then also requires to get up a hill to be able to enter/exit the home    Prior Function Prior Level of Function : Needs assist       Physical Assist : Mobility (physical);ADLs (physical) Mobility (physical): Transfers;Gait;Stairs ADLs (physical): IADLs;Bathing;Dressing Mobility Comments: Pt reported they would complete stand transfers to [facility] and standing tolerance of 30 seconds. ADLs Comments: Pt reported they would try to complete what they could     Extremity/Trunk Assessment   Upper Extremity Assessment Upper Extremity Assessment: Defer to OT evaluation    Lower Extremity Assessment Lower Extremity Assessment: Generalized weakness RLE Deficits / Details: pt unable to flex hips, knees, or ankles against gravity. RLE Sensation: history of peripheral neuropathy LLE Deficits / Details: pt unable to flex hips, knees, or ankles against gravity. LLE Sensation: history of peripheral neuropathy    Cervical  / Trunk Assessment Cervical / Trunk Assessment: Normal  Communication   Communication Communication: No apparent difficulties Factors Affecting Communication: Reduced clarity of speech;Difficulty expressing self (Pt reported that they have difficulties with speech due to damaged voice box and speaked softly until the end of the session.)    Cognition Arousal: Alert Behavior During Therapy: WFL for tasks assessed/performed   PT - Cognitive impairments: Safety/Judgement                         Following commands: Intact       Cueing Cueing Techniques: Verbal cues     General Comments General comments (skin integrity, edema, etc.): [name] HR varied throughout session, ranging from the low 110s -150s with mobility. Besides fatigue, no adverse symptoms reported    Exercises     Assessment/Plan    PT Assessment Patient needs continued PT services  PT Problem List Decreased strength;Cardiopulmonary status limiting activity;Pain;Decreased range of motion;Decreased activity tolerance;Decreased balance;Decreased mobility;Decreased skin integrity;Decreased safety awareness;Decreased knowledge of use of DME;Decreased coordination       PT Treatment Interventions DME instruction;Gait training;Stair training;Functional mobility training;Therapeutic activities;Therapeutic exercise;Balance training;Neuromuscular re-education;Patient/family education;Wheelchair mobility training;Modalities    PT Goals (Current goals can be found in the Care Plan section)  Acute Rehab PT Goals Patient Stated Goal: improve independence PT Goal Formulation: With patient Time For Goal Achievement: 10/26/23 Potential to Achieve Goals: Fair    Frequency Min 2X/week     Co-evaluation  PT/OT/SLP Co-Evaluation/Treatment: Yes Reason for Co-Treatment: Complexity of the patient's impairments (multi-system involvement);Necessary to address cognition/behavior during functional activity;For patient/therapist  safety PT goals addressed during session: Mobility/safety with mobility;Balance;Strengthening/ROM         AM-PAC PT "6 Clicks" Mobility  Outcome Measure Help needed turning from your back to your side while in a flat bed without using bedrails?: A Little Help needed moving from lying on your back to sitting on the side of a flat bed without using bedrails?: A Little Help needed moving to and from a bed to a chair (including a wheelchair)?: A Little Help needed standing up from a chair using your arms (e.g., wheelchair or bedside chair)?: A Lot Help needed to walk in hospital room?: Total Help needed climbing 3-5 steps with a railing? : Total 6 Click Score: 13    End of Session Equipment Utilized During Treatment: Gait belt;Oxygen Activity Tolerance: Patient limited by fatigue Patient left: in bed;with call bell/phone within reach (MD ok'd no alarm per pt adament refusal/request) Nurse Communication: Mobility status;Other (comment) (pt's ending position) PT Visit Diagnosis: Unsteadiness on feet (R26.81);Other abnormalities of gait and mobility (R26.89);Muscle weakness (generalized) (M62.81);Difficulty in walking, not elsewhere classified (R26.2);Pain Pain - Right/Left: Right (Bilateral) Pain - part of body: Leg;Ankle and joints of foot    Time: 0820-0925 PT Time Calculation (min) (ACUTE ONLY): 65 min   Charges:   PT Evaluation $PT Eval Moderate Complexity: 1 Mod PT Treatments $Therapeutic Activity: 8-22 mins PT General Charges $$ ACUTE PT VISIT: 1 Visit         [address], SPT   [place] 10/12/2023, 3:52 PM

## 2023-10-12 NOTE — Progress Notes (Signed)
 Pt refused morning CHG bath. Pt stated, "the air vents are continuously on". Attempted to move pt, pt still refused. Attempted to educate pt on importance of CHG bath in order to prevent infection.   Bari Edward, RN

## 2023-10-12 NOTE — Progress Notes (Addendum)
 HD#2 SUBJECTIVE:  Patient Summary: Phillip Young is a 38 y.o. with chronic hypoxic respiratory failure on 2-4L home O2, HFrEF, Afib on Eliquis, chronic pain, ESRD on HD who presented to the ED with shortness of breath after missing dialysis session admitted for [facility].     Overnight Events: None  Interim History: Patient is evaluated at bedside, sitting on the side of the bed with PT in the room, appears frustrated, reporting that he does not want the bed alarm to be on and limit his mobility in the bed. Reports that he wants his room's door to be shut completely, does not want his privacy to be infringed. PT at bedside concerned about falls, reports that he was "nodding" off in the middle of conversation as he is sitting beside the bed, which increasing the risk of falling forward and injury. He is told to keep his legs elevated and avoid sitting beside bed for too long.   Otherwise, patient reports improvement in breathing and LE swelling. Reports that abdominal swelling is still present, no pain. Denies any nausea or vomiting. Tolerating PO well.    OBJECTIVE:  Vital Signs: Vitals:   10/11/23 2000 10/11/23 2311 10/12/23 0209 10/12/23 0515  BP:  (!) 123/102 98/80 120/85  Pulse: (!) 116 (!) 103 (!) 110   Resp: 20 (!) 23 (!) 22 (!) 23  Temp:  98.2 F (36.8 C) 98.2 F (36.8 C) 97.7 F (36.5 C)  TempSrc:  Oral Axillary Oral  SpO2: 100% 100% 100%   Weight:      Height:       Supplemental O2: Nasal Cannula SpO2: 100 % O2 Flow Rate (L/min): 7 L/min  Filed Weights   10/10/23 1514  Weight: (!) 167.8 kg     Intake/Output Summary (Last 24 hours) at 10/12/2023 0717 Last data filed at 10/12/2023 1610 Gross per 24 hour  Intake 720 ml  Output 3900 ml  Net -3180 ml   Net IO Since Admission: -3,180 mL [10/12/23 0717]  Physical Exam: Physical Exam  General: Obese, sitting beside bed, no acute distress  Cardiovascular: Regular rate Pulmonary: breathing comfortably, no wheezing   Abdomen: Soft, nontender, nondistended, bowel sounds present MSK: bilateral LE edema Sacrum: Stage 2 sacrum ulcer ~2 mm, non draining. Has area of induration adjacent to ulcer.  Skin: Warm  Patient Lines/Drains/Airways Status     Active Line/Drains/Airways     Name Placement date Placement time Site Days   Peripheral IV 10/10/23 22 G Posterior;Right Forearm 10/10/23  --  Forearm  2   Hemodialysis Catheter Right Internal jugular Double lumen Permanent (Tunneled) 05/30/23  0856  Internal jugular  135   Wound / Incision (Open or Dehisced) 08/31/23 Other (Comment) Thigh Left;Posterior partial thickness blisters 08/31/23  --  Thigh  42   Wound / Incision (Open or Dehisced) 09/22/23 Non-pressure wound Toe (Comment  which one) Right R great toenail removed 09/22/23  1930  Toe (Comment  which one)  20             ASSESSMENT/PLAN:  Assessment: Principal Problem:   Acute on chronic hypoxic respiratory failure ([name]) Active Problems:   Morbid obesity ([name])   Chronic systolic CHF (congestive heart failure) ([name])   Chronic respiratory failure with hypoxia ([name])   Paroxysmal atrial flutter ([name])   ESRD (end stage renal disease) on dialysis ([name])   Hypotension   Hypervolemia   Type 2 diabetes mellitus with diabetic chronic kidney disease ([name])   Plan: Acute on chronic hypoxic  respiratory failure  Pulmonary edema Hx presumed amiodarone toxicity HFrEF, LVEF <20% ESRD on HD  Hyperphosphatemia  AGMA Presently on 7L oxygen, reports that breathing has improved compared to yesterday. LE edema has provided. Had a HD session yesterday with 3.9L, plan for another one today. Nephrology on board, appreciate recommendations    - HD session today  - Wean oxygen as tolerated, baseline 3-4L - Continue digoxin 0.125 mg 3 times daily on Monday, Wednesday, Friday - Continue Renvela 1600 mg 3 times daily with meals - Continue Cinacelet 30 mg daily on dialysis days - PT/OT/TOC   Hypotension Likely  due to above with underlying vasoplegia. - Continue home medication midodrine 30 mg TID    Anemia of chronic disease Hgb stable at 11.2 (10.3). No signs of blood loss. Continue to monitor  Sacral Decubitus Ulcer Stage 2 Indurated abscess  - Apply warm compresses around the induration site to facilitate drainage - Clindamycin topical gel    Paroxysmal atrial fibrillation - Telemetry monitoring - Continue home Eliquis 5mg  BID, Digoxin 0.125mg  MWF   Hyponatremia Na 129 (131) likely due to hypervolemia  - Trend    Chronic somatic and neuropathic pain Chronic opiate therapy It has been attempted to switch away from chronic oxycodone therapy particularly with history of ESRD, however patient reports dilaudid has been less effective and requested continued oxycodone.  - Continue venlafaxine 37.5mg  daily, lyrica 50mg  daily, oxycodone 10mg  q6h prn.  - Continue Tylenol 1000 mg every 6 hours for pain control   Hypothyroidism Continue home levothyroxine   Depression Continue home venlafaxine 37.5mg ,   Best Practice: Diet: Renal diet IVF: Fluids: None, Rate: None VTE: Eliquis 5 mg BID Code: Full AB: None Therapy Recs: CIR, DME: none Family Contact: Mother, was on call  DISPO: Anticipated discharge  2-3 days  to  [facility]  pending  medical management .  Signature: Jeral Pinch, D.O.  Internal Medicine Resident, PGY-1 [facility]  Pager: [phone] 7:17 AM, 10/12/2023   Please contact the on call pager after 5 pm and on weekends at [phone].

## 2023-10-12 NOTE — Progress Notes (Signed)
   Inpatient Rehab Admissions Coordinator :  Per therapy recommendations patient was screened for CIR candidacy by Ottie Glazier RN MSN. As per my note 09/23/23 on a previous admission, patient with repeated admissions over the past several months. Was at [facility] CIR 11/8 until 06/20/23 and went home with Mom. Is having ongoing issues to go to OP hemodialysis due to transportation issues, etc. Has also been to [facility] AIR level rehab 07/11/24. The barriers to OP hemodialysis remain. CIR/AIR admit will not be able to secure that barrier. Other rehab venues should be pursued.  Ottie Glazier RN MSN Admissions Coordinator [phone]

## 2023-10-13 ENCOUNTER — Telehealth: Payer: Self-pay | Admitting: *Deleted

## 2023-10-13 DIAGNOSIS — N186 End stage renal disease: Secondary | ICD-10-CM | POA: Diagnosis not present

## 2023-10-13 DIAGNOSIS — E8779 Other fluid overload: Secondary | ICD-10-CM | POA: Diagnosis not present

## 2023-10-13 DIAGNOSIS — I5022 Chronic systolic (congestive) heart failure: Secondary | ICD-10-CM | POA: Diagnosis not present

## 2023-10-13 DIAGNOSIS — J9611 Chronic respiratory failure with hypoxia: Secondary | ICD-10-CM

## 2023-10-13 DIAGNOSIS — S31000D Unspecified open wound of lower back and pelvis without penetration into retroperitoneum, subsequent encounter: Secondary | ICD-10-CM

## 2023-10-13 LAB — RENAL FUNCTION PANEL
Albumin: 3.3 g/dL — ABNORMAL LOW (ref 3.5–5.0)
Anion gap: 18 — ABNORMAL HIGH (ref 5–15)
BUN: 68 mg/dL — ABNORMAL HIGH (ref 6–20)
CO2: 16 mmol/L — ABNORMAL LOW (ref 22–32)
Calcium: 9 mg/dL (ref 8.9–10.3)
Chloride: 93 mmol/L — ABNORMAL LOW (ref 98–111)
Creatinine, Ser: 10.92 mg/dL — ABNORMAL HIGH (ref 0.61–1.24)
GFR, Estimated: 6 mL/min — ABNORMAL LOW (ref >60)
Glucose, Bld: 102 mg/dL — ABNORMAL HIGH (ref 70–99)
Phosphorus: 8.2 mg/dL — ABNORMAL HIGH (ref 2.5–4.6)
Potassium: 4.8 mmol/L (ref 3.5–5.1)
Sodium: 127 mmol/L — ABNORMAL LOW (ref 135–145)

## 2023-10-13 LAB — CBC
HCT: 32.5 % — ABNORMAL LOW (ref 39.0–52.0)
Hemoglobin: 10.6 g/dL — ABNORMAL LOW (ref 13.0–17.0)
MCH: 33.4 pg (ref 26.0–34.0)
MCHC: 32.6 g/dL (ref 30.0–36.0)
MCV: 102.5 fL — ABNORMAL HIGH (ref 80.0–100.0)
Platelets: 204 10*3/uL (ref 150–400)
RBC: 3.17 MIL/uL — ABNORMAL LOW (ref 4.22–5.81)
RDW: 17.6 % — ABNORMAL HIGH (ref 11.5–15.5)
WBC: 7 10*3/uL (ref 4.0–10.5)
nRBC: 0 % (ref 0.0–0.2)

## 2023-10-13 LAB — HEPATITIS B SURFACE ANTIBODY, QUANTITATIVE: Hep B S AB Quant (Post): 84.1 m[IU]/mL

## 2023-10-13 MED ORDER — ALTEPLASE 2 MG IJ SOLR
2.0000 mg | Freq: Once | INTRAMUSCULAR | Status: AC | PRN
  Administered 2023-10-13: 2 mg
  Filled 2023-10-13: qty 2

## 2023-10-13 MED ORDER — LIDOCAINE HCL (PF) 1 % IJ SOLN: 5.0000 mL | INTRAMUSCULAR | Status: DC | PRN

## 2023-10-13 MED ORDER — ANTICOAGULANT SODIUM CITRATE 4% (200MG/5ML) IV SOLN
5.0000 mL | Status: DC
  Administered 2023-10-19: 3.8 mL
  Administered 2023-10-21 – 2023-10-24 (×2): 5 mL
  Filled 2023-10-13 (×2): qty 5
  Filled 2023-10-13: qty 3.8
  Filled 2023-10-13 (×5): qty 5

## 2023-10-13 MED ORDER — NEPRO/CARBSTEADY PO LIQD: 237.0000 mL | ORAL | Status: DC | PRN

## 2023-10-13 MED ORDER — DARBEPOETIN ALFA 150 MCG/0.3ML IJ SOSY
150.0000 ug | PREFILLED_SYRINGE | INTRAMUSCULAR | Status: DC
  Administered 2023-10-14 – 2023-10-21 (×2): 150 ug via SUBCUTANEOUS
  Filled 2023-10-13 (×2): qty 0.3

## 2023-10-13 MED ORDER — METHOCARBAMOL 500 MG PO TABS
500.0000 mg | ORAL_TABLET | Freq: Once | ORAL | Status: AC
  Administered 2023-10-13: 500 mg via ORAL
  Filled 2023-10-13: qty 1

## 2023-10-13 MED ORDER — LIDOCAINE-PRILOCAINE 2.5-2.5 % EX CREA: 1.0000 | TOPICAL_CREAM | CUTANEOUS | Status: DC | PRN

## 2023-10-13 MED ORDER — PENTAFLUOROPROP-TETRAFLUOROETH EX AERO: 1.0000 | INHALATION_SPRAY | CUTANEOUS | Status: DC | PRN

## 2023-10-13 MED ORDER — ANTICOAGULANT SODIUM CITRATE 4% (200MG/5ML) IV SOLN
5.0000 mL | Status: DC | PRN
  Filled 2023-10-13: qty 5

## 2023-10-13 MED ORDER — ANTICOAGULANT SODIUM CITRATE 4% (200MG/5ML) IV SOLN: 5.0000 mL | Status: DC | PRN

## 2023-10-13 MED ORDER — ALTEPLASE 2 MG IJ SOLR: 2.0000 mg | Freq: Once | INTRAMUSCULAR | Status: DC | PRN

## 2023-10-13 NOTE — Telephone Encounter (Signed)
 Copied from CRM [id number]. Topic: General - Other >> Oct 13, 2023 11:32 AM Maree Krabbe H wrote: Reason for CRM: Patients mom called today and wanted to know what the patient needed to do because his line is messed up and will not be able to get dialysis, he is currently in the hospital right now but will soon be discharged. Patients mother [name] can be callback on [phone].

## 2023-10-13 NOTE — Progress Notes (Signed)
 Contacted by RN CM with request that HD clinic social worker be contacted to see if assistance can be provided to pt and pt's mother regarding pt's medicaid situation. Contacted HD clinic social worker who in turn contacted [facility] medicaid to request assistance with pt's case. DSS staff to investigate pt's case.Clinic social worker provided navigator the multiple transportation agencies that have been attempted for pt but have not worked out due to the location of pt's apt and the need for pt to be pushed up a grassy hill by multiple people. Clinic staff rec snf placement for rehab and reliable transportation to/from HD appts. All this info was provided to RN CM and CSW. Info provided to renal NP as well. Will assist as needed.   Olivia Canter Renal Navigator [phone]

## 2023-10-13 NOTE — Progress Notes (Signed)
 Complex Care Management Note Care Guide Note  10/13/2023 Name: Phillip Young MRN: [id number] DOB: [birth year]   Complex Care Management Outreach Attempts: An unsuccessful telephone outreach was attempted today to offer the patient information about available complex care management services.  Follow Up Plan:  Additional outreach attempts will be made to offer the patient complex care management information and services.   Encounter Outcome:  No Answer [facility] Care Guide  Direct Dial:[phone] Fax:[phone] Website: [email or web address]

## 2023-10-13 NOTE — Hospital Course (Addendum)
 Acute on chronic hypoxic respiratory failure, on home 2-4 L oxygen Pulmonary edema Hx presumed amiodarone toxicity HFrEF, LVEF <20% ESRD on HD  Hyperphosphatemia  HAGMA  Presented to ED with dyspnea and volume overload, over the course of his hospital stay, he has received multiple HD sessions per nephrology with total of 13,250L UF. Patient did express interest in palliative care who were consulted however due to high patient census, they were unable to see him during the hospital stay.  During this hospitalization, we continued his home medication digoxin 3 times daily on Monday/Wednesday/Friday.  We continued his dialysis medication such as Renvela and Cinnacelet.  Patient was discharged with outpatient referral to palliative care.   Hypotension Likely due to above with underlying vasoplegia. Continue home medication midodrine 30 mg TID    Anemia of chronic disease Hgb stable 9.9; No signs of blood loss. Continue to monitor. Aranesp 150 mcg every friday   Sacral Decubitus Ulcer Indurated abscess  Hx of hidradenitis suppurativa Patient has indurated abscess at sacrum with skin breakdown and noted at his inner thighs likely due to friction and with history of hidradenitis suppurativa.  Wound care was consulted, they recommended moisturizer. - Apply warm compresses around the induration site to facilitate drainage - Clindamycin topical gel for sacrum   Paroxysmal atrial fibrillation - Telemetry monitoring. Continue home Eliquis 5mg  BID, Digoxin 0.125mg  MWF   Chronic somatic and neuropathic pain Chronic opiate therapy It has been attempted to switch away from chronic oxycodone therapy particularly with history of ESRD, however patient reports dilaudid has been less effective and requested continued oxycodone.  Restarted his home medications venlafaxine 37.5 mg daily, Lyrica 50 mg daily, oxycodone 10 mg every 6 hours as needed, continue Tylenol 1000 mg every 6 hours for pain control.   Patient has been working with physical therapy and Occupational Therapy during this hospitalization.   Hypothyroidism Continue home levothyroxine   Depression Continue home venlafaxine 37.5mg    Muscle cramping Reported that it helped during dialysis, but has not been taking.   - Stop Robaxin 500 mg   Resolved:  - Hyponatremia   ==================================================================== Phillip Young, Phillip came to the hospital for shortness of breath and volume overload.   No changes to your medications were made.  Please continue taking your medication as prescribed.  Please make sure you follow-up for your electric wheelchair.  I sent an ambulatory referral for palliative care, they will call and get in touch with you.  Please continue your dialysis sessions.   If you have any of these following symptoms, please call us or seek care at an emergency department: -Chest Pain -Difficulty Breathing -Worsening abdominal pain -Syncope (passing out) -Drooping of face -Slurred speech -Sudden weakness in your leg or arm -Fever -Chills -blood in the stool -dark black, sticky stool  If you have any questions or concerns, call our clinic at [phone] or after hours call [phone] and ask for the internal medicine resident on call.  I am glad you are feeling better. It was a pleasure taking care for you. I wish a good recovery and good health!   Dr. Jeral Pinch    ==========================================================================

## 2023-10-13 NOTE — TOC Initial Note (Addendum)
 Transition of Care [facility]) - Initial/Assessment Note    Patient Details  Name: Phillip Young MRN: [id number] Date of Birth: 01-17-1986  Transition of Care [facility]) CM/SW Contact:    Phillip Lewandowsky, RN Phone Number: 10/13/2023, 11:36 AM  Clinical Narrative:  Patient presented for shortness of breath- acute hypoxic respiratory failure. Patient was recently discharged from [facility] 09-21-23 and he returned home with his mother-EPIC address correct. Patient provided Case Manager permission to call his mother. Spoke with Mrs. Phillip Young and she states she is having difficulty with Medicaid. The last Medicaid that the patient had March 1st was [facility]. Mother is having difficulty communicating with DSS and the HD CSW for assistance. Mother states she keeps running into road blocks. Case Manager did secure chat the Renal Navigator to see if the HD CSW can assist with this to help the patient get to HD. Unit CSW has asked the [facility] to complete a Medicaid Screen for the patient- Mrs Phillip Young and the patient are agreeable to the screen. Mother is stating this is why the patient ended back up in the hospital with Shortness of breath because his barrier is transportation to the HD center. Patient has an appointment for a power wheelchair on March 25th, Patient has DME trilogy, rolling walker, ane and bedside commode in the home. Patient was previously arranged with [facility]- added RN, OT, Aide. Unable to add a CSW for home since the patient has a CSW in the HD Center. Patient will need resumption orders once stable. Case Manager will continue to follow for additional transition of care needs as the patient progresses.               Expected Discharge Plan: Home w Home Health Services Barriers to Discharge: Continued Medical Work up  Patient Goals and CMS Choice Patient states their goals for this hospitalization and ongoing recovery are:: plan to to return home once  stable.  Expected Discharge Plan and Services   Discharge Planning Services: CM Consult Post Acute Care Choice: Home Health Living arrangements for the past 2 months: Apartment    HH Arranged: RN, Disease Management, OT, Nurse's Aide HH [facility]: [facility] Date HH Agency Contacted: 10/13/23 Time HH Agency Contacted: 1130 Representative spoke with at [facility] Agency: Adele Dan  Prior Living Arrangements/Services Living arrangements for the past 2 months: Apartment Lives with:: Parents Patient language and need for interpreter reviewed:: Yes Do you feel safe going back to the place where you live?: Yes      Need for Family Participation in Patient Care: Yes (Comment) Care giver support system in place?: Yes (comment) Current home services: DME (Trilogy, oxygen, scale, rolling walker, cane, wheelchair) Criminal Activity/Legal Involvement Pertinent to Current Situation/Hospitalization: No - Comment as needed  Activities of Daily Living   ADL Screening (condition at time of admission) Independently performs ADLs?: No Does the patient have a NEW difficulty with bathing/dressing/toileting/self-feeding that is expected to last >3 days?: No (needs assist) Does the patient have a NEW difficulty with getting in/out of bed, walking, or climbing stairs that is expected to last >3 days?: No (needs assist up with walker) Does the patient have a NEW difficulty with communication that is expected to last >3 days?: No Is the patient deaf or have difficulty hearing?: No Does the patient have difficulty seeing, even when wearing glasses/contacts?: No Does the patient have difficulty concentrating, remembering, or making decisions?: No  Permission Sought/Granted Permission sought to share information with :  Case Manager, Magazine features editor, Family Supports Permission granted to share information with : Yes, Verbal Permission Granted              Emotional  Assessment Appearance:: Appears stated age Attitude/Demeanor/Rapport: Engaged Affect (typically observed): Appropriate Orientation: : Oriented to Self Alcohol / Substance Use: Not Applicable Psych Involvement: No (comment)  Admission diagnosis:  Acute on chronic hypoxic respiratory failure ([name]) [J96.21] Patient Active Problem List   Diagnosis Date Noted   Sacral wound 10/12/2023   Chronic, continuous use of opioids 10/06/2023   Acute encephalopathy 09/22/2023   ESRD (end stage renal disease) ([name]) 09/10/2023   Paronychia of great toe of right foot 09/09/2023   Ingrown nail of great toe of right foot 09/09/2023   Ingrown toenail of right foot 09/08/2023   Atrial fibrillation with RVR ([name]) 09/07/2023   Encounter for screening involving social determinants of health (SDoH) 08/30/2023   Weakness of right foot 08/25/2023   ESRD needing dialysis ([name]) 07/02/2023   Hoarseness of voice 06/21/2023   Adjustment disorder with mixed anxiety and depressed mood 06/10/2023   Metabolic encephalopathy 06/07/2023   Severe muscle deconditioning 06/03/2023   HFrEF (heart failure with reduced ejection fraction) ([name]) 06/01/2023   Neuropathy 05/28/2023   MSSA bacteremia 05/26/2023   Hyperkalemia 03/21/2023   Central line infection 03/10/2023   Pressure injury of skin 01/23/2023   Acute hypoxic respiratory failure ([name]) 01/21/2023   Atrial fibrillation ([name]) 01/21/2023   Acute on chronic hypoxic respiratory failure ([name]) 08/07/2022   Congestive heart failure ([name]) 08/05/2022   Elevated liver enzymes 06/08/2022   Multiple nodules of lung 05/31/2022   History of Gram positive sepsis    Advanced care planning/counseling discussion    Recurrent infections 09/30/2021   Reactive depression 09/02/2021   Hypervolemia 08/18/2021   Type 2 diabetes mellitus with diabetic chronic kidney disease ([name]) 06/26/2021   Medical non-compliance 04/21/2021   Hypotension 04/21/2021   ESRD (end stage renal disease)  on dialysis ([name]) 04/16/2021   Paroxysmal atrial flutter ([name]) 04/12/2021   SOB (shortness of breath)    Cardiogenic shock ([name])    Chronic respiratory failure with hypoxia ([name]) 02/11/2021   Chronic systolic CHF (congestive heart failure) ([name])    Chronic cough    Morbid obesity ([name]) 02/26/2020   GERD without esophagitis 02/26/2020   OSA (obstructive sleep apnea) 02/26/2020   Hidradenitis suppurativa 02/26/2020   PCP:  [name], DO Pharmacy:   [id number] [place], [place] - [address] AT [facility] [address] & [address] [place] [address] Phone: [phone] Fax: [phone]  [facility] [address] [place] [address] Phone: [phone] Fax: [phone]  [facility] [address] [place] [address] Phone: [phone] Fax: [phone]  [facility] [place], [place] - [address] [place] [address] Phone: [phone] Fax: [phone]     Social Drivers of Health (SDOH) Social History: SDOH Screenings   Food Insecurity: Food Insecurity Present (10/11/2023)  Housing: High Risk (10/11/2023)  Transportation Needs: Unmet Transportation Needs (10/11/2023)  Utilities: At Risk (10/11/2023)  Alcohol Screen: Low Risk  (10/06/2023)  Depression (PHQ2-9): High Risk (10/06/2023)  Financial Resource Strain: Low Risk  (10/06/2023)  Physical Activity: Insufficiently Active (10/06/2023)  Social Connections: Socially Isolated (10/06/2023)  Stress: No Stress Concern Present (10/06/2023)  Tobacco Use: Medium Risk (10/11/2023)  Health Literacy: Adequate  Health Literacy (10/06/2023)   SDOH Interventions:     Readmission Risk Interventions    10/13/2023   11:27 AM 09/23/2023    5:05 PM 07/05/2023    4:25 PM  Readmission Risk Prevention Plan  Transportation Screening Complete Complete Complete  HRI or  Home Care Consult   Complete  Social Work Consult for Recovery Care Planning/Counseling   Complete  Palliative Care Screening   Not Applicable  Medication Review Oceanographer) Referral to Pharmacy Referral to Pharmacy Complete  PCP or Specialist appointment within 3-5 days of discharge  Complete   HRI or Home Care Consult Complete Complete   SW Recovery Care/Counseling Consult Complete Complete   Palliative Care Screening Not Applicable Not Applicable   Skilled Nursing Facility Not Applicable Not Applicable

## 2023-10-13 NOTE — [place] FL2 (Signed)
 [place] MEDICAID FL2 LEVEL OF CARE FORM     IDENTIFICATION  Patient Name: Phillip Young Birthdate: 13-Jul-1986 Sex: male Admission Date (Current Location): 10/10/2023  [facility] and [place] Number:  Producer, television/film/video and Address:  [place]. [facility], [address] [place], [place] [address]      Provider Number: [id number]  Attending Physician Name and Address:  Dickie La, MD  Relative Name and Phone Number:       Current Level of Care: Hospital Recommended Level of Care: Skilled Nursing Facility Prior Approval Number:    Date Approved/Denied:   PASRR Number: [id number]  Discharge Plan: SNF    Current Diagnoses: Patient Active Problem List   Diagnosis Date Noted   Sacral wound 10/12/2023   Chronic, continuous use of opioids 10/06/2023   Acute encephalopathy 09/22/2023   ESRD (end stage renal disease) ([name]) 09/10/2023   Paronychia of great toe of right foot 09/09/2023   Ingrown nail of great toe of right foot 09/09/2023   Ingrown toenail of right foot 09/08/2023   Atrial fibrillation with RVR ([name]) 09/07/2023   Encounter for screening involving social determinants of health (SDoH) 08/30/2023   Weakness of right foot 08/25/2023   ESRD needing dialysis ([name]) 07/02/2023   Hoarseness of voice 06/21/2023   Adjustment disorder with mixed anxiety and depressed mood 06/10/2023   Metabolic encephalopathy 06/07/2023   Severe muscle deconditioning 06/03/2023   HFrEF (heart failure with reduced ejection fraction) ([name]) 06/01/2023   Neuropathy 05/28/2023   MSSA bacteremia 05/26/2023   Hyperkalemia 03/21/2023   Central line infection 03/10/2023   Pressure injury of skin 01/23/2023   Acute hypoxic respiratory failure ([name]) 01/21/2023   Atrial fibrillation ([name]) 01/21/2023   Acute on chronic hypoxic respiratory failure ([name]) 08/07/2022   Congestive heart failure ([name]) 08/05/2022   Elevated liver enzymes 06/08/2022   Multiple nodules of lung 05/31/2022    History of Gram positive sepsis    Advanced care planning/counseling discussion    Recurrent infections 09/30/2021   Reactive depression 09/02/2021   Hypervolemia 08/18/2021   Type 2 diabetes mellitus with diabetic chronic kidney disease ([name]) 06/26/2021   Medical non-compliance 04/21/2021   Hypotension 04/21/2021   ESRD (end stage renal disease) on dialysis ([name]) 04/16/2021   Paroxysmal atrial flutter ([name]) 04/12/2021   SOB (shortness of breath)    Cardiogenic shock ([name])    Chronic respiratory failure with hypoxia ([name]) 02/11/2021   Chronic systolic CHF (congestive heart failure) ([name])    Chronic cough    Morbid obesity ([name]) 02/26/2020   GERD without esophagitis 02/26/2020   OSA (obstructive sleep apnea) 02/26/2020   Hidradenitis suppurativa 02/26/2020    Orientation RESPIRATION BLADDER Height & Weight     Self, Time, Situation, Place      Weight: (!) 369 lb 14.9 oz (167.8 kg) Height:  6' (182.9 cm)  BEHAVIORAL SYMPTOMS/MOOD NEUROLOGICAL BOWEL NUTRITION STATUS        Diet (Please see discharge summary)  AMBULATORY STATUS COMMUNICATION OF NEEDS Skin   Extensive Assist                           Personal Care Assistance Level of Assistance  Bathing, Feeding, Dressing Bathing Assistance: Maximum assistance Feeding assistance: Independent Dressing Assistance: Maximum assistance     Functional Limitations Info  Sight, Hearing, Speech Sight Info: Adequate Hearing Info: Adequate Speech Info: Adequate    SPECIAL CARE FACTORS FREQUENCY  PT (By licensed PT),  OT (By licensed OT)     PT Frequency: 5x min weekly OT Frequency: 5x min weekly            Contractures Contractures Info: Not present    Additional Factors Info  Code Status, Allergies, Psychotropic Code Status Info: Full Allergies Info: Amiodarone,Coreg (carvedilol),Heparin,Metoprolol,Amoxicillin,Other-Steroids Fluid seeping out of legs Psychotropic Info: pregabalin (LYRICA) capsule 50 mg  daily,venlafaxine XR (EFFEXOR-XR) 24 hr capsule 37.5 mg daily         Current Medications (10/13/2023):  This is the current hospital active medication list Current Facility-Administered Medications  Medication Dose Route Frequency Provider Last Rate Last Admin   acetaminophen (TYLENOL) tablet 1,000 mg  1,000 mg Oral Q6H Atway, Rayann N, DO   1,000 mg at 10/13/23 1203   apixaban (ELIQUIS) tablet 5 mg  5 mg Oral BID Atway, Rayann N, DO   5 mg at 10/13/23 1103   Chlorhexidine Gluconate Cloth 2 % PADS 6 each  6 each Topical Q0600 Delano Metz, MD   6 each at 10/13/23 0514   cinacalcet (SENSIPAR) tablet 30 mg  30 mg Oral Q dialysis Atway, Rayann N, DO       clindamycin (CLINDAGEL) 1 % gel   Topical BID Tawkaliyar, Roya, DO   Given at 10/13/23 1104   [START ON 10/14/2023] Darbepoetin Alfa (ARANESP) injection 150 mcg  150 mcg Subcutaneous Q Fri-1800 Berenda Morale, NP       digoxin Margit Banda) tablet 0.125 mg  0.125 mg Oral Once per day on Monday Wednesday Friday Atway, Rayann N, DO   0.125 mg at 10/12/23 0841   levothyroxine (SYNTHROID) tablet 25 mcg  25 mcg Oral Q0600 Atway, Rayann N, DO   25 mcg at 10/13/23 0513   midodrine (PROAMATINE) tablet 30 mg  30 mg Oral TID WC Atway, Rayann N, DO   30 mg at 10/13/23 0746   oxyCODONE (Oxy IR/ROXICODONE) immediate release tablet 10 mg  10 mg Oral Q6H PRN Atway, Rayann N, DO   10 mg at 10/13/23 1203   pregabalin (LYRICA) capsule 50 mg  50 mg Oral Daily Atway, Rayann N, DO   50 mg at 10/13/23 1102   sevelamer carbonate (RENVELA) tablet 1,600 mg  1,600 mg Oral TID WC Atway, Rayann N, DO   1,600 mg at 10/13/23 1102   venlafaxine XR (EFFEXOR-XR) 24 hr capsule 37.5 mg  37.5 mg Oral Daily Atway, Rayann N, DO   37.5 mg at 10/13/23 1103     Discharge Medications: Please see discharge summary for a list of discharge medications.  Relevant Imaging Results:  Relevant Lab Results:   Additional Information SSN-[id number],  HD at [facility] on MWF  [place], [place]

## 2023-10-13 NOTE — Progress Notes (Signed)
 Received patient in bed to unit.  Alert and oriented.  Informed consent signed and in chart.   TX duration:30 minutes out of 3.5 hour scheduled treatment  Patient tolerated well.  Transported back to the room  Alert, without acute distress.  Hand-off given to patient's nurse.   Access used: Right [facility] Access issues: Yes, poor delivery from [facility]. Dwelling with TPA until the PM shift   Total UF removed: -200 ml Medication(s) given: See [facility]  10/13/23 0930  Vitals  Pulse Rate (!) 115  Resp (!) 27  BP 104/85  SpO2 100 %  O2 Device Bi-PAP  Oxygen Therapy  O2 Flow Rate (L/min) 6 L/min  Patient Activity (if Appropriate) In bed  Pulse Oximetry Type Continuous  Oximetry Probe Site Changed No

## 2023-10-13 NOTE — TOC Progression Note (Addendum)
 Transition of Care [facility]) - Progression Note    Patient Details  Name: Phillip Young MRN: [id number] Date of Birth: [birth year]  Transition [facility]) CM/SW Contact  [place], [place] Phone Number: 10/13/2023, 3:46 PM  Clinical Narrative:   CSW spoke with patients mother to discuss her or patient calling DSS to figure out medicaid application status. [name] stated she can call but was needing help locating the right person to call. CSW provided her with the numbers for the DSS director, case worker for [employer], and  Medicaid Recertifications and Changes caseworker. CSW asked [name] if patient has disability but she was not sure how much patient received.   CSW asked Ms. [name] if she thinks patient will be agreeable to SNF. She stated "No, not if it is [place]". CSW asked if it is okay to send referrals to other facilities to provide patient with bed offers, she stated yes. Ms. [name] has been to [facility] in the past and said that facility was okay. CSW completed SNF workup, will need to provide patient with bed offers and update Renal Navigator. CSW notified treatment team.  TOC will continue to follow.    Expected Discharge Plan: Home w Home Health Services Barriers to Discharge: Continued Medical Work up  Expected Discharge Plan and Services   Discharge Planning Services: CM Consult Post Acute Care Choice: Home Health Living arrangements for the past 2 months: Apartment                           HH Arranged: RN, Disease Management, OT, Nurse's Aide HH [facility] Date HH Agency Contacted: 10/13/23 Time HH Agency Contacted: 1130 Representative spoke with at [facility] Agency: Adele Dan   Social Determinants of Health (SDOH) Interventions SDOH Screenings   Food Insecurity: Food Insecurity Present (10/11/2023)  Housing: High Risk (10/11/2023)  Transportation Needs: Unmet Transportation Needs (10/11/2023)  Utilities: At Risk  (10/11/2023)  Alcohol Screen: Low Risk  (10/06/2023)  Depression (PHQ2-9): High Risk (10/06/2023)  Financial Resource Strain: Low Risk  (10/06/2023)  Physical Activity: Insufficiently Active (10/06/2023)  Social Connections: Socially Isolated (10/06/2023)  Stress: No Stress Concern Present (10/06/2023)  Tobacco Use: Medium Risk (10/11/2023)  Health Literacy: Adequate Health Literacy (10/06/2023)    Readmission Risk Interventions    10/13/2023   11:27 AM 09/23/2023    5:05 PM 07/05/2023    4:25 PM  Readmission Risk Prevention Plan  Transportation Screening Complete Complete Complete  HRI or Home Care Consult   Complete  Social Work Consult for Recovery Care Planning/Counseling   Complete  Palliative Care Screening   Not Applicable  Medication Review Oceanographer) Referral to Pharmacy Referral to Pharmacy Complete  PCP or Specialist appointment within 3-5 days of discharge  Complete   HRI or Home Care Consult Complete Complete   SW Recovery Care/Counseling Consult Complete Complete   Palliative Care Screening Not Applicable Not Applicable   Skilled Nursing Facility Not Applicable Not Applicable

## 2023-10-13 NOTE — Telephone Encounter (Signed)
 RTC to patient and mother. Concerns about the Dialysis Access is not working properly.  Patient's mom stated that problems were developing last Friday at Dialysis and that patient has had Dialysis since his Admission.  Mom was requesting that the doctors fix or do something about getting his Access cleared for hid Dialysis.  No referral per Mom was made from  the Dialysis Center.  Patient forgot to mention to Kidney doctor he spoke to today.  Patient's mom was advised to call the patient's nurse and inform her of the issue so that the doctors can be made aware of the problem and notify the doctors who placed the Access that patient is having problems.  Mom said she will call back to let us know what happens.

## 2023-10-13 NOTE — Progress Notes (Addendum)
 [facility] Progress Note   Subjective:    Seen and examined patient in his room. Seen sitting up eating breakfast. No issues. Plan for HD today.  Objective Vitals:   10/12/23 1932 10/12/23 2355 10/13/23 0325 10/13/23 0644  BP: 106/76 113/87 (!) 108/95 120/74  Pulse: 99 (!) 109 (!) 112 (!) 114  Resp: 19 16 20 20   Temp: 98.4 F (36.9 C) 98.5 F (36.9 C) 98.3 F (36.8 C)   TempSrc: Oral Oral Oral   SpO2: 99% 100% 98% 95%  Weight:      Height:       Physical Exam Gen alert, no distress, nasal oxygen 7L Sclera anicteric, throat clear  No jvd or bruits Chest clear bilat to bases, no rales/ wheezing RRR no MRG Abd soft ntnd no mass or ascites +bs Ext 1-2+ LE edema, no other edema Neuro is alert, Ox 3 , nf TDC intact  Filed Weights   10/10/23 1514  Weight: (!) 167.8 kg    Intake/Output Summary (Last 24 hours) at 10/13/2023 0753 Last data filed at 10/13/2023 0525 Gross per 24 hour  Intake 1186 ml  Output --  Net 1186 ml    Additional Objective Labs: Basic Metabolic Panel: Recent Labs  Lab 10/10/23 1553 10/10/23 2030 10/12/23 0402 10/13/23 0442  NA 131*  --  129* 127*  K 4.6  --  4.7 4.8  CL 96*  --  93* 93*  CO2 16*  --  21* 16*  GLUCOSE 98  --  95 102*  BUN 72*  --  61* 68*  CREATININE 11.48*  --  9.98* 10.92*  CALCIUM 9.0  --  9.2 9.0  PHOS  --  7.1* 7.9* 8.2*   Liver Function Tests: Recent Labs  Lab 10/12/23 0402 10/13/23 0442  ALBUMIN 3.2* 3.3*   No results for input(s): "LIPASE", "AMYLASE" in the last 168 hours. CBC: Recent Labs  Lab 10/10/23 1553 10/12/23 0402 10/13/23 0442  WBC 9.3 7.6 7.0  NEUTROABS 6.9  --   --   HGB 10.3* 11.2* 10.6*  HCT 31.6* 34.0* 32.5*  MCV 104.6* 103.0* 102.5*  PLT 178 199 204   Blood Culture    Component Value Date/Time   SDES BLOOD BLOOD RIGHT HAND 05/27/2023 0959   SPECREQUEST  05/27/2023 0959    BOTTLES DRAWN AEROBIC AND ANAEROBIC Blood Culture adequate volume   CULT  05/27/2023 0959     NO GROWTH 5 DAYS Performed at [facility], [address], [place], [place] [address]    REPTSTATUS 06/01/2023 FINAL 05/27/2023 6045    Cardiac Enzymes: No results for input(s): "CKTOTAL", "CKMB", "CKMBINDEX", "TROPONINI" in the last 168 hours. CBG: No results for input(s): "GLUCAP" in the last 168 hours. Iron Studies: No results for input(s): "IRON", "TIBC", "TRANSFERRIN", "FERRITIN" in the last 72 hours. Lab Results  Component Value Date   INR 2.0 (H) 06/10/2022   INR 2.8 (H) 06/09/2022   INR 2.9 (H) 06/07/2022   Studies/Results: No results found.  Medications:   acetaminophen  1,000 mg Oral Q6H   apixaban  5 mg Oral BID   Chlorhexidine Gluconate Cloth  6 each Topical Q0600   clindamycin   Topical BID   digoxin  0.125 mg Oral Once per day on Monday Wednesday Friday   levothyroxine  25 mcg Oral Q0600   methocarbamol  500 mg Oral Once   midodrine  30 mg Oral TID WC   pregabalin  50 mg Oral Daily  sevelamer carbonate  1,600 mg Oral TID WC   venlafaxine XR  37.5 mg Oral Daily    Dialysis Orders: MWF [facility] Needs updating --> 4.5H B400    155.5 kg   2.0 bath TDC   No heparin (history of HIT) -Patient does not accept blood products -Micera Q2wks - last dose 09/30/23  Renal-related home meds: Sensipar 30 mg MWF Midodrine 30 mg 3 times daily Renvela 1600 mg 3 times daily AC Others: Venlafaxine, Lyrica, oxycodone IR, Synthroid, digoxin, Eliquis, Narcan as needed, Zepbound  Assessment/Plan: Acute on chronic hypoxic respiratory failure: With pulmonary edema on chest x-ray.  Volume excess on exam with lower extremity edema.  See below ESRD - on HD MWF.  Off schedule. Received HD 3/18 and 3.9L removed. Next HD 3/20. Return back to MWF schedule next week. BP -chronic hypotension on high-dose midodrine at home.  Continue. Volume -volume overload with 1-2+ bilateral lower extremity edema.  Get weights after dialysis if possible. Anemia of esrd -hemoglobin 10-11  here, follow. Next ESA dose due tomorrow-will order Secondary hyperparathyroidism -calcium in range, Phos a bit high.  Continue binders with meals Chronic pain syndrome Atrial fibrillation   ADDENDUM 10/13/23 at 1010AM: Informed patient's TDC is malfunctioning causing BFR to be lowered to 200. Unable to attempt UFG. Tried re-positioning patient and still unsuccessful. He is allergic to Heparin. Will dwell with cath-flo in both ports for a few hours and try HD later this afternoon or evening. If still unsuccessful, may need to reach out to IR for possible TDC exchange. Rebbeca Paul, NP  Salome Holmes, NP [facility] 10/13/2023,7:53 AM  LOS: 3 days

## 2023-10-13 NOTE — Progress Notes (Addendum)
 HD#3 SUBJECTIVE:  Patient Summary: Phillip Young is a 38 y.o. with chronic hypoxic respiratory failure on 2-4L home O2, HFrEF, Afib on Eliquis, chronic pain, ESRD on HD who presented to the ED with shortness of breath after missing dialysis session admitted for [facility] and hypervolemia.   Overnight Events: None  Interim History: patient is evaluated at bedside, sitting beside bed, eating breakfast. Slept well. Reports some weeping in the folds of her abdomen, Did not have dialysis yesterday because of some emergency. On 2-4L oxygen at home. Reports that breathing is about the same as yesterday. No other concerns this AM.   OBJECTIVE:  Vital Signs: Vitals:   10/12/23 1932 10/12/23 2355 10/13/23 0325 10/13/23 0644  BP: 106/76 113/87 (!) 108/95 120/74  Pulse: 99 (!) 109 (!) 112 (!) 114  Resp: 19 16 20 20   Temp: 98.4 F (36.9 C) 98.5 F (36.9 C) 98.3 F (36.8 C)   TempSrc: Oral Oral Oral   SpO2: 99% 100% 98% 95%  Weight:      Height:       Supplemental O2: Nasal Cannula SpO2: 95 % O2 Flow Rate (L/min): 7 L/min  Filed Weights   10/10/23 1514  Weight: (!) 167.8 kg     Intake/Output Summary (Last 24 hours) at 10/13/2023 1610 Last data filed at 10/13/2023 9604 Gross per 24 hour  Intake 1186 ml  Output --  Net 1186 ml   Net IO Since Admission: -1,994 mL [10/13/23 0658]  Physical Exam: Physical Exam General: Sitting beside bed, eating breakfast, no acute distress Cardiovascular: Regular rate Pulmonary: Breathing comfortably on 3 L of oxygen, no wheezing or crackles Abdomen: Soft, bowel sounds present, has cloth placed in the folds of abdomen, no weeping sites.  MSK: Bilateral lower extremity edema Sacrum: indurated area with adjacent ulcer, non draining.   Patient Lines/Drains/Airways Status     Active Line/Drains/Airways     Name Placement date Placement time Site Days   Peripheral IV 10/10/23 22 G Posterior;Right Forearm 10/10/23  --  Forearm  3   Hemodialysis  Catheter Right Internal jugular Double lumen Permanent (Tunneled) 05/30/23  0856  Internal jugular  136   Wound / Incision (Open or Dehisced) 08/31/23 Other (Comment) Thigh Left;Posterior partial thickness blisters 08/31/23  --  Thigh  43   Wound / Incision (Open or Dehisced) 09/22/23 Non-pressure wound Toe (Comment  which one) Right R great toenail removed 09/22/23  1930  Toe (Comment  which one)  21             ASSESSMENT/PLAN:  Assessment: Principal Problem:   Acute on chronic hypoxic respiratory failure ([name]) Active Problems:   Morbid obesity ([name])   Chronic systolic CHF (congestive heart failure) ([name])   Chronic respiratory failure with hypoxia ([name])   Paroxysmal atrial flutter ([name])   ESRD (end stage renal disease) on dialysis ([name])   Hypotension   Hypervolemia   Type 2 diabetes mellitus with diabetic chronic kidney disease ([name])   Sacral wound   Plan: Acute on chronic hypoxic respiratory failure, on home 2-4 L oxygen Pulmonary edema Hx presumed amiodarone toxicity HFrEF, LVEF <20% ESRD on HD  Hyperphosphatemia  HAGMA  Reports that breathing is about the same as yesterday. Was not able to get dialysis session yesterday due to emergency, is planned for a session today. Nephrology on board.  - HD session today  - Wean oxygen as tolerated, baseline 3-4L - Continue digoxin 0.125 mg 3 times daily on Monday, Wednesday, Friday - Continue  Renvela 1600 mg 3 times daily with meals - Continue Cinacelet 30 mg daily on dialysis days - PT/OT/TOC  Muscle cramping Reported episodes of muscle cramping during dialysis. Mg 2.1, K 4.8, P 8.2. Will try a dose of robaxin prior to dialysis, and see whether he tolerates the whole session.  - Robaxin 500 mg once prior to dialysis    Hypotension Likely due to above with underlying vasoplegia. Continue home medication midodrine 30 mg TID    Anemia of chronic disease Hgb stable at 10.6; No signs of blood loss. Continue to monitor    Sacral Decubitus Ulcer Stage 2 Indurated abscess  - Apply warm compresses around the induration site to facilitate drainage - Clindamycin topical gel    Paroxysmal atrial fibrillation - Telemetry monitoring - Continue home Eliquis 5mg  BID, Digoxin 0.125mg  MWF   Hyponatremia Na 127 (129) likely due to hypervolemia and underlying ESRD  - Trend    Chronic somatic and neuropathic pain Chronic opiate therapy It has been attempted to switch away from chronic oxycodone therapy particularly with history of ESRD, however patient reports dilaudid has been less effective and requested continued oxycodone.  - Continue venlafaxine 37.5mg  daily, lyrica 50mg  daily, oxycodone 10mg  q6h prn.  - Continue Tylenol 1000 mg every 6 hours for pain control   Hypothyroidism Continue home levothyroxine   Depression Continue home venlafaxine 37.5mg ,   Best Practice: Diet: Renal diet IVF: Fluids: None, Rate: None VTE: Eliquis 5 mg BID Code: Full AB: None  Therapy Recs: SNF, DME: none DISPO: Anticipated discharge  1-2 days   to Skilled nursing facility pending  medical management  .  Signature: Jeral Pinch, D.O.  Internal Medicine Resident, PGY-1 [facility]  Pager: [phone] 6:58 AM, 10/13/2023   Please contact the on call pager after 5 pm and on weekends at [phone].

## 2023-10-14 ENCOUNTER — Telehealth: Payer: Self-pay | Admitting: *Deleted

## 2023-10-14 DIAGNOSIS — J9621 Acute and chronic respiratory failure with hypoxia: Secondary | ICD-10-CM | POA: Diagnosis not present

## 2023-10-14 DIAGNOSIS — I5022 Chronic systolic (congestive) heart failure: Secondary | ICD-10-CM | POA: Diagnosis not present

## 2023-10-14 DIAGNOSIS — E8779 Other fluid overload: Secondary | ICD-10-CM | POA: Diagnosis not present

## 2023-10-14 DIAGNOSIS — N186 End stage renal disease: Secondary | ICD-10-CM | POA: Diagnosis not present

## 2023-10-14 LAB — RENAL FUNCTION PANEL
Albumin: 3.1 g/dL — ABNORMAL LOW (ref 3.5–5.0)
Anion gap: 13 (ref 5–15)
BUN: 51 mg/dL — ABNORMAL HIGH (ref 6–20)
CO2: 20 mmol/L — ABNORMAL LOW (ref 22–32)
Calcium: 8.5 mg/dL — ABNORMAL LOW (ref 8.9–10.3)
Chloride: 96 mmol/L — ABNORMAL LOW (ref 98–111)
Creatinine, Ser: 8.67 mg/dL — ABNORMAL HIGH (ref 0.61–1.24)
GFR, Estimated: 7 mL/min — ABNORMAL LOW (ref >60)
Glucose, Bld: 131 mg/dL — ABNORMAL HIGH (ref 70–99)
Phosphorus: 6.8 mg/dL — ABNORMAL HIGH (ref 2.5–4.6)
Potassium: 3.6 mmol/L (ref 3.5–5.1)
Sodium: 129 mmol/L — ABNORMAL LOW (ref 135–145)

## 2023-10-14 LAB — CBC
HCT: 30.9 % — ABNORMAL LOW (ref 39.0–52.0)
Hemoglobin: 10.3 g/dL — ABNORMAL LOW (ref 13.0–17.0)
MCH: 33.8 pg (ref 26.0–34.0)
MCHC: 33.3 g/dL (ref 30.0–36.0)
MCV: 101.3 fL — ABNORMAL HIGH (ref 80.0–100.0)
Platelets: 191 10*3/uL (ref 150–400)
RBC: 3.05 MIL/uL — ABNORMAL LOW (ref 4.22–5.81)
RDW: 17.8 % — ABNORMAL HIGH (ref 11.5–15.5)
WBC: 7 10*3/uL (ref 4.0–10.5)
nRBC: 0 % (ref 0.0–0.2)

## 2023-10-14 MED ORDER — METHOCARBAMOL 500 MG PO TABS: 500.0000 mg | ORAL_TABLET | ORAL | Status: DC

## 2023-10-14 MED ORDER — OXYCODONE HCL 5 MG PO TABS
5.0000 mg | ORAL_TABLET | Freq: Once | ORAL | Status: AC
  Administered 2023-10-14: 5 mg via ORAL

## 2023-10-14 MED ORDER — ANTICOAGULANT SODIUM CITRATE 4% (200MG/5ML) IV SOLN
5.0000 mL | Freq: Once | Status: AC
  Administered 2023-10-14: 5 mL
  Filled 2023-10-14: qty 5

## 2023-10-14 NOTE — Progress Notes (Signed)
 PT Cancellation Note  Patient Details Name: Ryot Burrous MRN: [id number] DOB: [birth year]   Cancelled Treatment:    Reason Eval/Treat Not Completed: (P) Patient at procedure or test/unavailable. Pt at HD. Will plan to follow-up later as time permits.   Virgil Benedict, PT, DPT Acute Rehabilitation Services  Office: [phone]    Bettina Gavia 10/14/2023, 2:31 PM

## 2023-10-14 NOTE — Progress Notes (Signed)
 Complex Care Management Note Care Guide Note  10/14/2023 Name: Phillip Young MRN: [id number] DOB: 1986/06/21   Complex Care Management Outreach Attempts: A second unsuccessful outreach was attempted today to offer the patient with information about available complex care management services.  Follow Up Plan:  Additional outreach attempts will be made to offer the patient complex care management information and services.   Encounter Outcome:  No Answer [facility] Care Guide  Direct [phone] Fax:[phone] Website: [email or web address]

## 2023-10-14 NOTE — Progress Notes (Signed)
 Pt unable to UF 4 L as ordered d/t pt had cramping at last 10 mins. Pt removed 3.6 L.  10/14/23 1810  Vitals  Temp 97.6 F (36.4 C)  Temp Source Oral  BP 92/62  BP Location Right Wrist  BP Method Automatic  Patient Position (if appropriate) Lying  Pulse Rate (!) 117  Resp 19  Oxygen Therapy  SpO2 100 %  O2 Device Nasal Cannula  O2 Flow Rate (L/min) 4 L/min  During Treatment Monitoring  Intra-Hemodialysis Comments Tx completed  Post Treatment  Dialyzer Clearance Lightly streaked  Hemodialysis Intake (mL) 0 mL  Liters Processed 82  Fluid Removed (mL) 3600 mL  Tolerated HD Treatment Yes  Post-Hemodialysis Comments Tx done.  Hemodialysis Catheter Right Internal jugular Double lumen Permanent (Tunneled)  Placement Date/Time: 05/30/23 0856   Serial / Lot #: [id number]  Expiration Date: 09/23/27  Time Out: Correct patient;Correct site;Correct procedure  Maximum sterile barrier precautions: Hand hygiene;Cap;Mask;Sterile gown;Sterile gloves;Large sterile s...  Site Condition No complications  Catheter fill solution 4% Sodium Citrate  Catheter fill volume (Arterial) 1.9 cc  Catheter fill volume (Venous) 1.9  Dressing Type Transparent  Dressing Status Clean, Dry, Intact  Interventions New dressing  Drainage Description None  Dressing Change Due 10/20/23  Post treatment catheter status Capped and Clamped

## 2023-10-14 NOTE — Progress Notes (Signed)
 Attempted to educate pt on fluid restriction in order to promote a positive health outcome. Pt stated, "I'm a grown ass man; furthermore if I go over my parameters just chart it it".   Bari Edward, RN

## 2023-10-14 NOTE — Progress Notes (Addendum)
 [facility] Progress Note   Subjective:    Was able to complete dialysis yesterday after cathflo dwell.  4 L UF. Remains on 6-7L [place]. HD again today.   Objective Vitals:   10/14/23 0001 10/14/23 0511 10/14/23 0800 10/14/23 1005  BP: (!) 93/51 91/72 (!) 128/110 118/82  Pulse: (!) 109 (!) 107 (!) 113 (!) 131  Resp: (!) 22 19 (!) 29 (!) 22  Temp: 97.6 F (36.4 C) 97.7 F (36.5 C) 97.9 F (36.6 C) 98.3 F (36.8 C)  TempSrc: Oral Oral Oral Oral  SpO2:  99%    Weight:  (!) 185.8 kg    Height:       Physical Exam Gen alert, no distress, nasal oxygen 7L Chest clear bilat to bases, no rales/ wheezing Tachy  Abd soft ntnd no mass or ascites  Ext 1-2+ LE edema  Neuro is alert, Ox 3 , nf TDC intact  Filed Weights   10/13/23 0852 10/13/23 1340 10/14/23 0511  Weight: (!) 167.8 kg (!) 167.8 kg (!) 185.8 kg    Intake/Output Summary (Last 24 hours) at 10/14/2023 1030 Last data filed at 10/14/2023 1019 Gross per 24 hour  Intake 1200 ml  Output --  Net 1200 ml    Additional Objective Labs: Basic Metabolic Panel: Recent Labs  Lab 10/12/23 0402 10/13/23 0442 10/14/23 0430  NA 129* 127* 129*  K 4.7 4.8 3.6  CL 93* 93* 96*  CO2 21* 16* 20*  GLUCOSE 95 102* 131*  BUN 61* 68* 51*  CREATININE 9.98* 10.92* 8.67*  CALCIUM 9.2 9.0 8.5*  PHOS 7.9* 8.2* 6.8*   Liver Function Tests: Recent Labs  Lab 10/12/23 0402 10/13/23 0442 10/14/23 0430  ALBUMIN 3.2* 3.3* 3.1*   No results for input(s): "LIPASE", "AMYLASE" in the last 168 hours. CBC: Recent Labs  Lab 10/10/23 1553 10/12/23 0402 10/13/23 0442 10/14/23 0430  WBC 9.3 7.6 7.0 7.0  NEUTROABS 6.9  --   --   --   HGB 10.3* 11.2* 10.6* 10.3*  HCT 31.6* 34.0* 32.5* 30.9*  MCV 104.6* 103.0* 102.5* 101.3*  PLT 178 199 204 191   Blood Culture    Component Value Date/Time   SDES BLOOD BLOOD RIGHT HAND 05/27/2023 0959   SPECREQUEST  05/27/2023 0959    BOTTLES DRAWN AEROBIC AND ANAEROBIC Blood Culture  adequate volume   CULT  05/27/2023 0959    NO GROWTH 5 DAYS Performed at [facility], [address] [place], [place] [address]    REPTSTATUS 06/01/2023 FINAL 05/27/2023 6045    Cardiac Enzymes: No results for input(s): "CKTOTAL", "CKMB", "CKMBINDEX", "TROPONINI" in the last 168 hours. CBG: No results for input(s): "GLUCAP" in the last 168 hours. Iron Studies: No results for input(s): "IRON", "TIBC", "TRANSFERRIN", "FERRITIN" in the last 72 hours. Lab Results  Component Value Date   INR 2.0 (H) 06/10/2022   INR 2.8 (H) 06/09/2022   INR 2.9 (H) 06/07/2022   Studies/Results: No results found.  Medications:  [START ON 10/15/2023] anticoagulant sodium citrate      acetaminophen  1,000 mg Oral Q6H   apixaban  5 mg Oral BID   Chlorhexidine Gluconate Cloth  6 each Topical Q0600   clindamycin   Topical BID   darbepoetin (ARANESP) injection - DIALYSIS  150 mcg Subcutaneous Q Fri-1800   digoxin  0.125 mg Oral Once per day on Monday Wednesday Friday   levothyroxine  25 mcg Oral Q0600   [START ON 10/17/2023] methocarbamol  500 mg  Oral Q M,W,F-HD   midodrine  30 mg Oral TID WC   pregabalin  50 mg Oral Daily   sevelamer carbonate  1,600 mg Oral TID WC   venlafaxine XR  37.5 mg Oral Daily    Dialysis Orders: MWF [facility] Needs updating  4.5H B400    155.5 kg   2.0 bath TDC   No heparin (history of HIT) -Patient does not accept blood products -Micera Q2wks - last dose 09/30/23  Renal-related home meds: Sensipar 30 mg MWF Midodrine 30 mg 3 times daily Renvela 1600 mg 3 times daily AC Others: Venlafaxine, Lyrica, oxycodone IR, Synthroid, digoxin, Eliquis, Narcan as needed, Zepbound  Assessment/Plan: Acute on chronic hypoxic respiratory failure: With pulmonary edema on chest x-ray.  Volume excess on exam with lower extremity edema. ESRD - on HD MWF.  Received HD 3/8, 3/20. Try to get back on schedule today  BP -chronic hypotension on high-dose midodrine at home.   Continue. Volume -Chronic volume overload.  Manage volume with HD. Tolerating 4L UF  Anemia of esrd -hemoglobin 10-11 here, follow. Aranesp 200 ordered for 3/21.  Secondary hyperparathyroidism -calcium in range, Phos a bit high.  Continue binders with meals Chronic pain syndrome Atrial fibrillation   Tomasa Blase PA-C [facility] 10/14/2023,10:30 AM

## 2023-10-14 NOTE — TOC Progression Note (Signed)
 Transition of Care [facility]) - Progression Note    Patient Details  Name: Phillip Young MRN: [id number] Date of Birth: 1986/07/03  [facility] CM/SW Contact  Delilah Shan, LCSWA Phone Number: 10/14/2023, 5:30 PM  Clinical Narrative:     Patient currently off the floor unable to provide SNF bed offers. TOC will follow up tomorrow with SNF bed offers for patient to review. CSW will continue to follow.  Expected Discharge Plan: Home w Home Health Services Barriers to Discharge: Continued Medical Work up  Expected Discharge Plan and Services   Discharge Planning Services: CM Consult Post Acute Care Choice: Home Health Living arrangements for the past 2 months: Apartment                           HH Arranged: RN, Disease Management, OT, Nurse's Aide HH Agency: [facility] Date HH Agency Contacted: 10/13/23 Time HH Agency Contacted: 1130 Representative spoke with at [facility] Agency: Adele Dan   Social Determinants of Health (SDOH) Interventions SDOH Screenings   Food Insecurity: Food Insecurity Present (10/11/2023)  Housing: High Risk (10/11/2023)  Transportation Needs: Unmet Transportation Needs (10/11/2023)  Utilities: At Risk (10/11/2023)  Alcohol Screen: Low Risk  (10/06/2023)  Depression (PHQ2-9): High Risk (10/06/2023)  Financial Resource Strain: Low Risk  (10/06/2023)  Physical Activity: Insufficiently Active (10/06/2023)  Social Connections: Socially Isolated (10/06/2023)  Stress: No Stress Concern Present (10/06/2023)  Tobacco Use: Medium Risk (10/11/2023)  Health Literacy: Adequate Health Literacy (10/06/2023)    Readmission Risk Interventions    10/13/2023   11:27 AM 09/23/2023    5:05 PM 07/05/2023    4:25 PM  Readmission Risk Prevention Plan  Transportation Screening Complete Complete Complete  HRI or Home Care Consult   Complete  Social Work Consult for Recovery Care Planning/Counseling   Complete  Palliative Care Screening   Not  Applicable  Medication Review Ocean[facility]) Referral to Pharmacy Referral to Pharmacy Complete  PCP or Specialist appointment within 3-5 days of discharge  Complete   HRI or Home Care Consult Complete Complete   SW Recovery Care/Counseling Consult Complete Complete   Palliative Care Screening Not Applicable Not Applicable   Skilled Nursing Facility Not Applicable Not Applicable

## 2023-10-14 NOTE — Progress Notes (Signed)
 HD#4 SUBJECTIVE:  Patient Summary: Phillip Young is a 38 y.o. with a pertinent PMH of chronic hypoxic respiratory failure on 2-4L home O2, HFrEF, Afib on Eliquis, chronic pain, ESRD on HD who presented to the ED with shortness of breath after missing dialysis session admitted for [facility] and hypervolemia.   Overnight Events: None  Interim History: Patient is evaluated at bedside, reports he has pain in the site of his HD catheter, worse with rotation of his neck. Reports that it started 2 days ago, comes and goes. Reports Left foot pain. Reports posterior buttocks pain, worse when he is sitting. States that he wants for topical ointment on his wounds. Rn at bedside reports that he had dialysis session last night, the catheter is working.   OBJECTIVE:  Vital Signs: Vitals:   10/14/23 0001 10/14/23 0511 10/14/23 0800 10/14/23 1005  BP: (!) 93/51 91/72 (!) 128/110 118/82  Pulse: (!) 109 (!) 107 (!) 113 (!) 131  Resp: (!) 22 19 (!) 29 (!) 22  Temp: 97.6 F (36.4 C) 97.7 F (36.5 C) 97.9 F (36.6 C) 98.3 F (36.8 C)  TempSrc: Oral Oral Oral Oral  SpO2:  99%    Weight:  (!) 185.8 kg    Height:       Supplemental O2: Nasal Cannula SpO2: 99 % O2 Flow Rate (L/min): 6 L/min  Filed Weights   10/13/23 0852 10/13/23 1340 10/14/23 0511  Weight: (!) 167.8 kg (!) 167.8 kg (!) 185.8 kg     Intake/Output Summary (Last 24 hours) at 10/14/2023 1123 Last data filed at 10/14/2023 1019 Gross per 24 hour  Intake 1200 ml  Output --  Net 1200 ml   Net IO Since Admission: -474 mL [10/14/23 1123]  Physical Exam: Physical Exam  General: Laying in bed. No acute distress  Cardiovascular: Regular rate Pulmonary: Breathing comfortably on 7 L of oxygen, no wheezing or crackles Abdomen: Obese, Soft, NT, ND.  MSK: Bilateral lower extremity edema Sacrum/buttock/inner thighs: has indurated area on hi sacrum with stage 2 decubitus ulcer. Has multiple areas of grade 1/2 pressure ulcers along his inner  thighs and medical buttocks with areas of induration.   Patient Lines/Drains/Airways Status     Active Line/Drains/Airways     Name Placement date Placement time Site Days   Peripheral IV 10/10/23 22 G Posterior;Right Forearm 10/10/23  --  Forearm  4   Hemodialysis Catheter Right Internal jugular Double lumen Permanent (Tunneled) 05/30/23  0856  Internal jugular  137   Wound / Incision (Open or Dehisced) 08/31/23 Other (Comment) Thigh Left;Posterior partial thickness blisters 08/31/23  --  Thigh  44   Wound / Incision (Open or Dehisced) 09/22/23 Non-pressure wound Toe (Comment  which one) Right R great toenail removed 09/22/23  1930  Toe (Comment  which one)  22             ASSESSMENT/PLAN:  Assessment: Principal Problem:   Acute on chronic hypoxic respiratory failure ([name]) Active Problems:   Morbid obesity ([name])   Chronic systolic CHF (congestive heart failure) ([name])   Chronic respiratory failure with hypoxia ([name])   Paroxysmal atrial flutter ([name])   ESRD (end stage renal disease) on dialysis ([name])   Hypotension   Hypervolemia   Type 2 diabetes mellitus with diabetic chronic kidney disease ([name])   Sacral wound   Plan: Acute on chronic hypoxic respiratory failure, on home 2-4 L oxygen Pulmonary edema Hx presumed amiodarone toxicity HFrEF, LVEF <20% ESRD on HD  Hyperphosphatemia  HAGMA  Patient had issue with his HD cathter yesterday with concern for clogging, given Alteplase, was able to tolerate dialysis session last night 3/20.  - HD today   - Wean oxygen as tolerated, baseline 3-4L - Continue digoxin 0.125 mg 3 times daily on Monday, Wednesday, Friday - Continue Renvela 1600 mg 3 times daily with meals - Continue Cinacelet 30 mg daily on dialysis days - TOC pending SNF at [facility]    Muscle cramping Reported that it helped during dialysis.  - Robaxin 500 mg MWF during HD    Hypotension Likely due to above with underlying vasoplegia. Continue home  medication midodrine 30 mg TID    Anemia of chronic disease Hgb stable at 10.3; No signs of blood loss. Continue to monitor - Aranesp 150 mcg every friday   Sacral Decubitus Ulcer Stage 2 Indurated abscesses Multiple areas of pressure ulcers along inner thigh  - Apply warm compresses around the induration site to facilitate drainage - Clindamycin topical gel for sacrum - Wound care consult for further management of the ulcers    Paroxysmal atrial fibrillation - Telemetry monitoring - Continue home Eliquis 5mg  BID, Digoxin 0.125mg  MWF   Hyponatremia Na 129 likely due to hypervolemia and underlying ESRD  - Trend    Chronic somatic and neuropathic pain Chronic opiate therapy It has been attempted to switch away from chronic oxycodone therapy particularly with history of ESRD, however patient reports dilaudid has been less effective and requested continued oxycodone.  - Continue venlafaxine 37.5mg  daily, lyrica 50mg  daily, oxycodone 10mg  q6h prn.  - Continue Tylenol 1000 mg every 6 hours for pain control   Hypothyroidism Continue home levothyroxine   Depression Continue home venlafaxine 37.5mg    Best Practice: Diet: Renal diet IVF: Fluids: None, Rate: None VTE: Eliquis BID Code: Full AB: None Wounds: As above Therapy Recs: SNF, DME: none Family Contact: mother,  DISPO: Anticipated discharge  2-3 days  to Skilled nursing facility pending  bed placement .  Signature: Jeral Pinch, D.O.  Internal Medicine Resident, PGY-1 [facility]  Pager: [phone] 11:23 AM, 10/14/2023   Please contact the on call pager after 5 pm and on weekends at [phone].

## 2023-10-14 NOTE — Progress Notes (Signed)
 OT Cancellation Note  Patient Details Name: Ardis Fullwood MRN: [id number] DOB: 1985-12-25   Cancelled Treatment:    Reason Eval/Treat Not Completed: Patient at procedure or test/ unavailable (Patient off unit in HD) Alfonse Flavors, OTA Acute Rehabilitation Services  Office [phone]  Dewain Penning 10/14/2023, 2:28 PM

## 2023-10-15 DIAGNOSIS — J9621 Acute and chronic respiratory failure with hypoxia: Secondary | ICD-10-CM | POA: Diagnosis not present

## 2023-10-15 DIAGNOSIS — N186 End stage renal disease: Secondary | ICD-10-CM | POA: Diagnosis not present

## 2023-10-15 DIAGNOSIS — I482 Chronic atrial fibrillation, unspecified: Secondary | ICD-10-CM

## 2023-10-15 DIAGNOSIS — Z992 Dependence on renal dialysis: Secondary | ICD-10-CM | POA: Diagnosis not present

## 2023-10-15 LAB — RENAL FUNCTION PANEL
Albumin: 3.2 g/dL — ABNORMAL LOW (ref 3.5–5.0)
Anion gap: 15 (ref 5–15)
BUN: 43 mg/dL — ABNORMAL HIGH (ref 6–20)
CO2: 19 mmol/L — ABNORMAL LOW (ref 22–32)
Calcium: 8.7 mg/dL — ABNORMAL LOW (ref 8.9–10.3)
Chloride: 96 mmol/L — ABNORMAL LOW (ref 98–111)
Creatinine, Ser: 7.4 mg/dL — ABNORMAL HIGH (ref 0.61–1.24)
GFR, Estimated: 9 mL/min — ABNORMAL LOW (ref >60)
Glucose, Bld: 132 mg/dL — ABNORMAL HIGH (ref 70–99)
Phosphorus: 5.7 mg/dL — ABNORMAL HIGH (ref 2.5–4.6)
Potassium: 3.7 mmol/L (ref 3.5–5.1)
Sodium: 130 mmol/L — ABNORMAL LOW (ref 135–145)

## 2023-10-15 LAB — CBC
HCT: 32.9 % — ABNORMAL LOW (ref 39.0–52.0)
Hemoglobin: 10.6 g/dL — ABNORMAL LOW (ref 13.0–17.0)
MCH: 33.3 pg (ref 26.0–34.0)
MCHC: 32.2 g/dL (ref 30.0–36.0)
MCV: 103.5 fL — ABNORMAL HIGH (ref 80.0–100.0)
Platelets: 200 10*3/uL (ref 150–400)
RBC: 3.18 MIL/uL — ABNORMAL LOW (ref 4.22–5.81)
RDW: 17.9 % — ABNORMAL HIGH (ref 11.5–15.5)
WBC: 7.7 10*3/uL (ref 4.0–10.5)
nRBC: 0 % (ref 0.0–0.2)

## 2023-10-15 LAB — RPR: RPR Ser Ql: NONREACTIVE

## 2023-10-15 LAB — HIV ANTIBODY (ROUTINE TESTING W REFLEX): HIV Screen 4th Generation wRfx: NONREACTIVE

## 2023-10-15 MED ORDER — HYDROMORPHONE HCL 1 MG/ML IJ SOLN
1.0000 mg | Freq: Once | INTRAMUSCULAR | Status: AC
  Administered 2023-10-15: 1 mg via INTRAVENOUS
  Filled 2023-10-15: qty 1

## 2023-10-15 NOTE — Progress Notes (Signed)
 [facility] Progress Note   Subjective:    Completed dialysis yesterday -net UF 3.6L. Feels better with fluid coming off.   Objective Vitals:   10/15/23 0453 10/15/23 0839 10/15/23 0841 10/15/23 0845  BP: 114/75  (!) 123/109 122/89  Pulse: (!) 111  (!) 107 100  Resp: 19 17    Temp: 98.4 F (36.9 C) 98.4 F (36.9 C)    TempSrc: Oral Oral    SpO2: 98%  99% 99%  Weight:      Height:       Physical Exam Gen alert, no distress, nasal oxygen 6L Chest clear bilat to bases, no rales/ wheezing Tachy  Abd soft ntnd no mass or ascites  Ext 1-2+ LE edema  Neuro is alert, Ox 3 , nf TDC intact  Filed Weights   10/14/23 0511 10/14/23 1410 10/14/23 1810  Weight: (!) 185.8 kg (!) 185.8 kg (!) 182.2 kg    Intake/Output Summary (Last 24 hours) at 10/15/2023 1008 Last data filed at 10/15/2023 3086 Gross per 24 hour  Intake 1320 ml  Output 3600 ml  Net -2280 ml    Additional Objective Labs: Basic Metabolic Panel: Recent Labs  Lab 10/13/23 0442 10/14/23 0430 10/15/23 0358  NA 127* 129* 130*  K 4.8 3.6 3.7  CL 93* 96* 96*  CO2 16* 20* 19*  GLUCOSE 102* 131* 132*  BUN 68* 51* 43*  CREATININE 10.92* 8.67* 7.40*  CALCIUM 9.0 8.5* 8.7*  PHOS 8.2* 6.8* 5.7*   Liver Function Tests: Recent Labs  Lab 10/13/23 0442 10/14/23 0430 10/15/23 0358  ALBUMIN 3.3* 3.1* 3.2*   No results for input(s): "LIPASE", "AMYLASE" in the last 168 hours. CBC: Recent Labs  Lab 10/10/23 1553 10/12/23 0402 10/13/23 0442 10/14/23 0430 10/15/23 0358  WBC 9.3 7.6 7.0 7.0 7.7  NEUTROABS 6.9  --   --   --   --   HGB 10.3* 11.2* 10.6* 10.3* 10.6*  HCT 31.6* 34.0* 32.5* 30.9* 32.9*  MCV 104.6* 103.0* 102.5* 101.3* 103.5*  PLT 178 199 204 191 200   Blood Culture    Component Value Date/Time   SDES BLOOD BLOOD RIGHT HAND 05/27/2023 0959   SPECREQUEST  05/27/2023 0959    BOTTLES DRAWN AEROBIC AND ANAEROBIC Blood Culture adequate volume   CULT  05/27/2023 0959    NO GROWTH 5  DAYS Performed at [facility], [address] [place], [place] [address]    REPTSTATUS 06/01/2023 FINAL 05/27/2023 9629    Cardiac Enzymes: No results for input(s): "CKTOTAL", "CKMB", "CKMBINDEX", "TROPONINI" in the last 168 hours. CBG: No results for input(s): "GLUCAP" in the last 168 hours. Iron Studies: No results for input(s): "IRON", "TIBC", "TRANSFERRIN", "FERRITIN" in the last 72 hours. Lab Results  Component Value Date   INR 2.0 (H) 06/10/2022   INR 2.8 (H) 06/09/2022   INR 2.9 (H) 06/07/2022   Studies/Results: No results found.  Medications:  anticoagulant sodium citrate      acetaminophen  1,000 mg Oral Q6H   apixaban  5 mg Oral BID   Chlorhexidine Gluconate Cloth  6 each Topical Q0600   clindamycin   Topical BID   darbepoetin (ARANESP) injection - DIALYSIS  150 mcg Subcutaneous Q Fri-1800   digoxin  0.125 mg Oral Once per day on Monday Wednesday Friday   levothyroxine  25 mcg Oral Q0600   [START ON 10/17/2023] methocarbamol  500 mg Oral Q M,W,F-HD   midodrine  30 mg Oral TID WC  pregabalin  50 mg Oral Daily   sevelamer carbonate  1,600 mg Oral TID WC   venlafaxine XR  37.5 mg Oral Daily    Dialysis Orders: MWF [facility] Needs updating  4.5H B400    155.5 kg   2.0 bath TDC   No heparin (history of HIT) -Patient does not accept blood products -Micera Q2wks - last dose 09/30/23  Renal-related home meds: Sensipar 30 mg MWF Midodrine 30 mg 3 times daily Renvela 1600 mg 3 times daily AC Others: Venlafaxine, Lyrica, oxycodone IR, Synthroid, digoxin, Eliquis, Narcan as needed, Zepbound  Assessment/Plan: Acute on chronic hypoxic respiratory failure: With pulmonary edema on chest x-ray.  Volume excess on exam with lower extremity edema. Continue to lower volume with HD as able.  ESRD - on HD MWF.  Received HD 3/18, 3/20, 3/21. Not able to do serial HD today d/t high inpatient census for dialysis. Next HD Monday.  BP -chronic hypotension on high-dose  midodrine at home.  Continue. Volume -Chronic volume overload.  Manage volume with HD. Tolerating 4L UF  Anemia of esrd -hemoglobin 10-11 here, follow. Aranesp 200 ordered for 3/21.  Secondary hyperparathyroidism -calcium in range, Phos a bit high.  Continue binders with meals Chronic pain syndrome Atrial fibrillation - on Eliquis, digoxin Neuropathy - per primary  Dispo - pending    Tomasa Blase PA-C [facility] 10/15/2023,10:08 AM

## 2023-10-15 NOTE — Progress Notes (Signed)
 HD#5 Subjective:   Summary: 38 year old male with a past medical history of chronic hypoxic respiratory failure on 2-4 L home O2, HFrEF, ESRD on dialysis who presents for concerns of shortness of breath and admitted for hypervolemia.  Overnight Events: No acute events overnight  Patient reports having pain.  He states dialysis went well.  He is no longer having any cramps.  He has this chronic pain that is not well-controlled at this time.  He states he is having trouble walking at times.  He reports eating and drinking well.  No other concerns this morning.  Patient notes of breath is slowly improving day by day.  Oxygen requirement is decreasing.  Objective:  Vital signs in last 24 hours: Vitals:   10/14/23 1810 10/14/23 2026 10/14/23 2324 10/15/23 0453  BP: 92/62 (!) 116/93 118/70 114/75  Pulse: (!) 117 (!) 112 (!) 115 (!) 111  Resp: 19 19 20 19   Temp: 97.6 F (36.4 C) 98.2 F (36.8 C) 98.6 F (37 C) 98.4 F (36.9 C)  TempSrc: Oral Oral Oral Oral  SpO2: 100% 100% 100% 98%  Weight: (!) 182.2 kg     Height:       Supplemental O2: Nasal cannula SpO2: 98 % O2 Flow Rate (L/min): 4 L/min   Physical Exam:  Constitutional: Resting in bed, in some pain Cardiovascular: Irregularly irregular Pulmonary/Chest: normal work of breathing on 4 L nasal cannula, lungs clear to auscultation bilaterally Extremities: Bilateral lower extremities with 2-3+ pitting edema, tender to touch Skin: Stage II decubitus ulcer appreciated to sacrum, multiple wounds noted to inner thighs  Filed Weights   10/14/23 0511 10/14/23 1410 10/14/23 1810  Weight: (!) 185.8 kg (!) 185.8 kg (!) 182.2 kg     Intake/Output Summary (Last 24 hours) at 10/15/2023 0649 Last data filed at 10/15/2023 0865 Gross per 24 hour  Intake 1320 ml  Output 3600 ml  Net -2280 ml   Net IO Since Admission: -3,234 mL [10/15/23 0649]  Pertinent Labs:    Latest Ref Rng & Units 10/15/2023    3:58 AM 10/14/2023    4:30 AM  10/13/2023    4:42 AM  CBC  WBC 4.0 - 10.5 K/uL 7.7  7.0  7.0   Hemoglobin 13.0 - 17.0 g/dL 78.4  69.6  29.5   Hematocrit 39.0 - 52.0 % 32.9  30.9  32.5   Platelets 150 - 400 K/uL 200  191  204        Latest Ref Rng & Units 10/15/2023    3:58 AM 10/14/2023    4:30 AM 10/13/2023    4:42 AM  CMP  Glucose 70 - 99 mg/dL 284  132  440   BUN 6 - 20 mg/dL 43  51  68   Creatinine 0.61 - 1.24 mg/dL 1.02  7.25  36.64   Sodium 135 - 145 mmol/L 130  129  127   Potassium 3.5 - 5.1 mmol/L 3.7  3.6  4.8   Chloride 98 - 111 mmol/L 96  96  93   CO2 22 - 32 mmol/L 19  20  16    Calcium 8.9 - 10.3 mg/dL 8.7  8.5  9.0     Imaging: No results found.  Assessment/Plan:   Principal Problem:   Acute on chronic hypoxic respiratory failure ([name]) Active Problems:   Morbid obesity ([name])   Chronic systolic CHF (congestive heart failure) ([name])   Chronic respiratory failure with hypoxia ([name])   Paroxysmal atrial flutter ([name])  ESRD (end stage renal disease) on dialysis ([name])   Hypotension   Hypervolemia   Type 2 diabetes mellitus with diabetic chronic kidney disease ([name])   Sacral wound   Patient Summary: Lucille Crichlow is a 38 y.o. male with a past medical history of chronic hypoxic respiratory failure on 2-4 L home O2, HFrEF, ESRD on dialysis who presents for concerns of shortness of breath and admitted for hypervolemia.  #Acute on chronic hypoxemic respiratory failure in the setting of volume overload #Presumed amiodarone toxicity #HFrEF #ESRD on dialysis Patient had 3.6 L removed yesterday.  Oxygen requirement slowly improving.  I have weaned him down to 4 L in the room today.  Will continue to have dialysis per nephrology. -Continue dialysis per nephrology -Continue digoxin 0.125 mg 3 times daily on Monday, Wednesday, Friday -Continue Renvela 1600 mg 3 times daily with meals -Continue Cinacelet 30 mg daily on dialysis days -TOC pending SNF at [facility] -Continue wean off oxygen to home of  2-4 -Cramps have improved with Robaxin  #Chronic pain Patient is on chronic oxycodone.  He was previously on Dilaudid.  This pain started after being in the ICU and intubated causing critical illness neuropathy.  It has not improved.  He reports pain today. -Continue oxycodone 10 mg every 6 hours as needed -One-time dose of IV Dilaudid today -Continue PT/OT  #Stage II sacral decubitus ulcer #Indurated abscess noted sacrum #Multiple pressure ulcers in her thigh Wound care consulted.  Present on admission.  #Hypertension Blood pressure managing well with midodrine 30 mg 3 times daily -Continue midodrine 30 mg 3 times daily  #Anemia of chronic disease No acute concern for blood loss today.  Hemoglobin stable. -Continue Aranesp 150 mcg every Friday  #Hypothyroidism Continue home levothyroxine   #Depression Continue home venlafaxine 37.5mg   Diet: Renal IVF: None,None VTE: DOAC Code: Full PT/OT recs: SNF for Subacute PT   Dispo: Anticipated discharge to Skilled nursing facility in 3 days pending clinical improvement.   Modena Slater DO Internal Medicine Resident PGY-2 [phone] Please contact the on call pager after 5 pm and on weekends at [phone].

## 2023-10-15 NOTE — Progress Notes (Signed)
 Unable to obtain accurate bed weight, pt is refusing to stand on the scale.   Pt has a backscratcher device & tweezers at the bedside, blood found on the sheets. Pt refusing to change sheets. Pt stated the blood came from weeping on his right side. Weeping not previously assessed.  Bari Edward, RN
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
## 2023-10-15 NOTE — Plan of Care (Signed)

## 2023-10-16 DIAGNOSIS — J9621 Acute and chronic respiratory failure with hypoxia: Secondary | ICD-10-CM | POA: Diagnosis not present

## 2023-10-16 LAB — RENAL FUNCTION PANEL
Albumin: 3.2 g/dL — ABNORMAL LOW (ref 3.5–5.0)
Anion gap: 18 — ABNORMAL HIGH (ref 5–15)
BUN: 53 mg/dL — ABNORMAL HIGH (ref 6–20)
CO2: 18 mmol/L — ABNORMAL LOW (ref 22–32)
Calcium: 9.7 mg/dL (ref 8.9–10.3)
Chloride: 95 mmol/L — ABNORMAL LOW (ref 98–111)
Creatinine, Ser: 8.75 mg/dL — ABNORMAL HIGH (ref 0.61–1.24)
GFR, Estimated: 7 mL/min — ABNORMAL LOW (ref >60)
Glucose, Bld: 130 mg/dL — ABNORMAL HIGH (ref 70–99)
Phosphorus: 7.6 mg/dL — ABNORMAL HIGH (ref 2.5–4.6)
Potassium: 4.6 mmol/L (ref 3.5–5.1)
Sodium: 131 mmol/L — ABNORMAL LOW (ref 135–145)

## 2023-10-16 NOTE — Progress Notes (Signed)
 [facility] Progress Note   Subjective:    Seen in room. No new events overnight. Wishing he felt better.   Objective Vitals:   10/15/23 1550 10/15/23 2000 10/15/23 2348 10/16/23 0604  BP: 97/87 110/67 122/84 116/65  Pulse: (!) 110 73 (!) 106 (!) 122  Resp: 20 (!) 22 (!) 21 (!) 24  Temp: 98.1 F (36.7 C) 97.9 F (36.6 C) 98.2 F (36.8 C) 98.7 F (37.1 C)  TempSrc: Oral Oral Oral Oral  SpO2: 95% 92% 100% 100%  Weight:      Height:       Physical Exam Gen alert, no distress, nasal oxygen 6L Chest clear bilat to bases, no rales/ wheezing Tachy  Abd soft ntnd no mass or ascites  Ext 1-2+ LE edema  Neuro is alert, Ox 3 , nf TDC intact  Filed Weights   10/14/23 0511 10/14/23 1410 10/14/23 1810  Weight: (!) 185.8 kg (!) 185.8 kg (!) 182.2 kg    Intake/Output Summary (Last 24 hours) at 10/16/2023 0923 Last data filed at 10/16/2023 0630 Gross per 24 hour  Intake 720 ml  Output --  Net 720 ml    Additional Objective Labs: Basic Metabolic Panel: Recent Labs  Lab 10/14/23 0430 10/15/23 0358 10/16/23 0338  NA 129* 130* 131*  K 3.6 3.7 4.6  CL 96* 96* 95*  CO2 20* 19* 18*  GLUCOSE 131* 132* 130*  BUN 51* 43* 53*  CREATININE 8.67* 7.40* 8.75*  CALCIUM 8.5* 8.7* 9.7  PHOS 6.8* 5.7* 7.6*   Liver Function Tests: Recent Labs  Lab 10/14/23 0430 10/15/23 0358 10/16/23 0338  ALBUMIN 3.1* 3.2* 3.2*   No results for input(s): "LIPASE", "AMYLASE" in the last 168 hours. CBC: Recent Labs  Lab 10/10/23 1553 10/12/23 0402 10/13/23 0442 10/14/23 0430 10/15/23 0358  WBC 9.3 7.6 7.0 7.0 7.7  NEUTROABS 6.9  --   --   --   --   HGB 10.3* 11.2* 10.6* 10.3* 10.6*  HCT 31.6* 34.0* 32.5* 30.9* 32.9*  MCV 104.6* 103.0* 102.5* 101.3* 103.5*  PLT 178 199 204 191 200   Blood Culture    Component Value Date/Time   SDES BLOOD BLOOD RIGHT HAND 05/27/2023 0959   SPECREQUEST  05/27/2023 0959    BOTTLES DRAWN AEROBIC AND ANAEROBIC Blood Culture adequate  volume   CULT  05/27/2023 0959    NO GROWTH 5 DAYS Performed at [facility], [address] [place], [place] [address]    REPTSTATUS 06/01/2023 FINAL 05/27/2023 6606    Cardiac Enzymes: No results for input(s): "CKTOTAL", "CKMB", "CKMBINDEX", "TROPONINI" in the last 168 hours. CBG: No results for input(s): "GLUCAP" in the last 168 hours. Iron Studies: No results for input(s): "IRON", "TIBC", "TRANSFERRIN", "FERRITIN" in the last 72 hours. Lab Results  Component Value Date   INR 2.0 (H) 06/10/2022   INR 2.8 (H) 06/09/2022   INR 2.9 (H) 06/07/2022   Studies/Results: No results found.  Medications:  anticoagulant sodium citrate      acetaminophen  1,000 mg Oral Q6H   apixaban  5 mg Oral BID   Chlorhexidine Gluconate Cloth  6 each Topical Q0600   clindamycin   Topical BID   darbepoetin (ARANESP) injection - DIALYSIS  150 mcg Subcutaneous Q Fri-1800   digoxin  0.125 mg Oral Once per day on Monday Wednesday Friday   levothyroxine  25 mcg Oral Q0600   [START ON 10/17/2023] methocarbamol  500 mg Oral Q M,W,F-HD   midodrine  30 mg Oral TID WC   pregabalin  50 mg Oral Daily   sevelamer carbonate  1,600 mg Oral TID WC   venlafaxine XR  37.5 mg Oral Daily    Dialysis Orders: MWF [facility] Needs updating  4.5H B400    155.5 kg   2.0 bath TDC   No heparin (history of HIT) -Patient does not accept blood products -Micera Q2wks - last dose 09/30/23  Renal-related home meds: Sensipar 30 mg MWF Midodrine 30 mg 3 times daily Renvela 1600 mg 3 times daily AC Others: Venlafaxine, Lyrica, oxycodone IR, Synthroid, digoxin, Eliquis, Narcan as needed, Zepbound  Assessment/Plan: Acute on chronic hypoxic respiratory failure: With pulmonary edema on chest x-ray.  Volume excess on exam with lower extremity edema. Continue to lower volume with HD as able.  ESRD - on HD MWF.  Received HD 3/18, 3/20, 3/21. Not able to do serial HD today d/t high inpatient census for dialysis. Next HD  Monday.  BP -chronic hypotension on high-dose midodrine at home.  Continue. Volume -Chronic volume overload.  Manage volume with HD. Tolerating 4L UF  Anemia of esrd -hemoglobin 10-11 here, follow. Aranesp 200 ordered for 3/21.  Secondary hyperparathyroidism -calcium in range, Phos a bit high.  Continue binders with meals Chronic pain syndrome Atrial fibrillation - on Eliquis, digoxin Neuropathy - per primary  Dispo - pending    Phillip Blase PA-C [facility] 10/16/2023,9:23 AM

## 2023-10-16 NOTE — Plan of Care (Signed)
  Problem: Education: Goal: Knowledge of General Education information will improve Description: Including pain rating scale, medication(s)/side effects and non-pharmacologic comfort measures Outcome: Progressing   Problem: Health Behavior/Discharge Planning: Goal: Ability to manage health-related needs will improve Outcome: Progressing   Problem: Clinical Measurements: Goal: Ability to maintain clinical measurements within normal limits will improve Outcome: Progressing Goal: Will remain free from infection Outcome: Progressing Goal: Diagnostic test results will improve Outcome: Progressing Goal: Respiratory complications will improve Outcome: Progressing Goal: Cardiovascular complication will be avoided Outcome: Progressing   Problem: Activity: Goal: Risk for activity intolerance will decrease Outcome: Not Progressing   Problem: Nutrition: Goal: Adequate nutrition will be maintained Outcome: Progressing   Problem: Coping: Goal: Level of anxiety will decrease Outcome: Not Progressing

## 2023-10-16 NOTE — Consult Note (Signed)
 Please note that the [facility] nursing team is utilizing a standardized work plan to manage patient consults. We are triaging consults and will try to see the patients within 48 hours. Wound photos in the patient's chart allow [place] to consult on the patient in the most efficient and timely manner.    Guneet Delpino [facility], CNS, [employer] [phone]

## 2023-10-16 NOTE — Progress Notes (Signed)
 HD#6 Subjective:   Summary: 39 year old male with a past medical history of chronic hypoxic respiratory failure on 2-4 L home O2, HFrEF, ESRD on dialysis who presents for concerns of shortness of breath and admitted for hypervolemia.  Overnight Events: No acute events overnight  Patient reports that he is improving but is still having shortness of breath.  He states he is having leg pain as well.  He is concerned about his wheelchair, as he is supposed to see someone to be evaluated to get a wheelchair sometime this week.  Would ask him to call them to figure this out.  Objective:  Vital signs in last 24 hours: Vitals:   10/15/23 2000 10/15/23 2348 10/16/23 0604 10/16/23 0955  BP: 110/67 122/84 116/65 111/79  Pulse: 73 (!) 106 (!) 122 86  Resp: (!) 22 (!) 21 (!) 24 20  Temp: 97.9 F (36.6 C) 98.2 F (36.8 C) 98.7 F (37.1 C)   TempSrc: Oral Oral Oral   SpO2: 92% 100% 100% 96%  Weight:      Height:       Supplemental O2: Nasal cannula SpO2: 98 % O2 Flow Rate (L/min): 4 L/min   Physical Exam:  Constitutional: Resting in bed, in some pain Cardiovascular: Irregularly irregular, slightly tachycardic at this time Pulmonary/Chest: Increased work of breathing on 4 L nasal cannula, lungs clear to auscultation bilaterally Extremities: Bilateral lower extremities with 2-3+ pitting edema, tender to touch Skin: Stage II decubitus ulcer appreciated to sacrum, multiple wounds noted to inner thighs  Filed Weights   10/14/23 0511 10/14/23 1410 10/14/23 1810  Weight: (!) 185.8 kg (!) 185.8 kg (!) 182.2 kg     Intake/Output Summary (Last 24 hours) at 10/16/2023 1124 Last data filed at 10/16/2023 0630 Gross per 24 hour  Intake 720 ml  Output --  Net 720 ml   Net IO Since Admission: -2,514 mL [10/16/23 1124]  Pertinent Labs:    Latest Ref Rng & Units 10/15/2023    3:58 AM 10/14/2023    4:30 AM 10/13/2023    4:42 AM  CBC  WBC 4.0 - 10.5 K/uL 7.7  7.0  7.0   Hemoglobin 13.0 -  17.0 g/dL 16.1  09.6  04.5   Hematocrit 39.0 - 52.0 % 32.9  30.9  32.5   Platelets 150 - 400 K/uL 200  191  204        Latest Ref Rng & Units 10/16/2023    3:38 AM 10/15/2023    3:58 AM 10/14/2023    4:30 AM  CMP  Glucose 70 - 99 mg/dL 409  811  914   BUN 6 - 20 mg/dL 53  43  51   Creatinine 0.61 - 1.24 mg/dL 7.82  9.56  2.13   Sodium 135 - 145 mmol/L 131  130  129   Potassium 3.5 - 5.1 mmol/L 4.6  3.7  3.6   Chloride 98 - 111 mmol/L 95  96  96   CO2 22 - 32 mmol/L 18  19  20    Calcium 8.9 - 10.3 mg/dL 9.7  8.7  8.5     Imaging: No results found.  Assessment/Plan:   Principal Problem:   Acute on chronic hypoxic respiratory failure ([name]) Active Problems:   Morbid obesity ([name])   Chronic systolic CHF (congestive heart failure) ([name])   Chronic respiratory failure with hypoxia ([name])   Paroxysmal atrial flutter ([name])   ESRD (end stage renal disease) on dialysis ([name])   Hypotension  ESRD on dialysis ([name])   Hypervolemia   Type 2 diabetes mellitus with diabetic chronic kidney disease ([name])   Chronic a-fib ([name])   Sacral wound   Patient Summary: Phillip Young is a 38 y.o. male with a past medical history of chronic hypoxic respiratory failure on 2-4 L home O2, HFrEF, ESRD on dialysis who presents for concerns of shortness of breath and admitted for hypervolemia.  #Acute on chronic hypoxemic respiratory failure in the setting of volume overload #Presumed amiodarone toxicity #HFrEF #ESRD on dialysis Patient did not have dialysis yesterday.  Inpatient dialysis is becoming very full.  Patient will continue the dialysis while here.  Overall he is down a total of 2.5 L while here.  Will need to take off more fluids.  Continue dialysis per nephrology.   -Continue dialysis per nephrology -Continue digoxin 0.125 mg 3 times daily on Monday, Wednesday, Friday -Continue Renvela 1600 mg 3 times daily with meals -Continue Cinacelet 30 mg daily on dialysis days -TOC pending SNF at  [facility] -Continue wean off oxygen to home of 2-4 -Cramps have improved with Robaxin, will continue Robaxin  #Chronic pain Patient reports having chronic pain still.  Trying her best to manage with oxycodone. This pain started after being in the ICU and intubated causing critical illness neuropathy.  It has not improved.  He reports pain today again.  Gave one-time dose of Dilaudid yesterday which helped him. -Continue oxycodone 10 mg every 6 hours as needed -Continue PT/OT  #Stage II sacral decubitus ulcer #Indurated abscess noted sacrum #Multiple pressure ulcers in her thigh Wound care consulted.  Present on admission.  #Hypertension Blood pressure managing well with midodrine 30 mg 3 times daily -Continue midodrine 30 mg 3 times daily  #Anemia of chronic disease No acute concern for blood loss today.  Hemoglobin stable. -Continue Aranesp 150 mcg every Friday  #Atrial fibrillation Patient has irregularly irregular heart rate.  He is rate controlled at this time.  No acute concern for RVR. -Continue telemetry -Continue on Eliquis 5 mg twice daily  #Hypothyroidism Continue home levothyroxine   #Depression Continue home venlafaxine 37.5mg   Diet: Renal IVF: None,None VTE: DOAC Code: Full PT/OT recs: SNF for Subacute PT   Dispo: Anticipated discharge to Skilled nursing facility in 3 days pending clinical improvement.   Modena Slater DO Internal Medicine Resident PGY-2 [phone] Please contact the on call pager after 5 pm and on weekends at [phone].

## 2023-10-17 LAB — CBC
HCT: 32.5 % — ABNORMAL LOW (ref 39.0–52.0)
Hemoglobin: 10.5 g/dL — ABNORMAL LOW (ref 13.0–17.0)
MCH: 34.1 pg — ABNORMAL HIGH (ref 26.0–34.0)
MCHC: 32.3 g/dL (ref 30.0–36.0)
MCV: 105.5 fL — ABNORMAL HIGH (ref 80.0–100.0)
Platelets: 207 10*3/uL (ref 150–400)
RBC: 3.08 MIL/uL — ABNORMAL LOW (ref 4.22–5.81)
RDW: 17.5 % — ABNORMAL HIGH (ref 11.5–15.5)
WBC: 7.9 10*3/uL (ref 4.0–10.5)
nRBC: 0 % (ref 0.0–0.2)

## 2023-10-17 LAB — RENAL FUNCTION PANEL
Albumin: 3.2 g/dL — ABNORMAL LOW (ref 3.5–5.0)
Anion gap: 18 — ABNORMAL HIGH (ref 5–15)
BUN: 69 mg/dL — ABNORMAL HIGH (ref 6–20)
CO2: 17 mmol/L — ABNORMAL LOW (ref 22–32)
Calcium: 9.7 mg/dL (ref 8.9–10.3)
Chloride: 96 mmol/L — ABNORMAL LOW (ref 98–111)
Creatinine, Ser: 9.68 mg/dL — ABNORMAL HIGH (ref 0.61–1.24)
GFR, Estimated: 6 mL/min — ABNORMAL LOW (ref >60)
Glucose, Bld: 114 mg/dL — ABNORMAL HIGH (ref 70–99)
Phosphorus: 8.4 mg/dL — ABNORMAL HIGH (ref 2.5–4.6)
Potassium: 4.8 mmol/L (ref 3.5–5.1)
Sodium: 131 mmol/L — ABNORMAL LOW (ref 135–145)

## 2023-10-17 LAB — GC/CHLAMYDIA PROBE AMP (~~LOC~~) NOT AT ARMC
Chlamydia: NEGATIVE
Comment: NEGATIVE
Comment: NORMAL
Neisseria Gonorrhea: NEGATIVE

## 2023-10-17 MED ORDER — HYDROCERIN EX CREA
TOPICAL_CREAM | Freq: Every day | CUTANEOUS | Status: DC
  Filled 2023-10-17: qty 113

## 2023-10-17 NOTE — Plan of Care (Signed)

## 2023-10-17 NOTE — Progress Notes (Signed)
 Internal Medicine Clinic Attending  Case, documentation, and findings reviewed. I agree with the assessment, diagnosis, and plan of care as outlined in the AWV note.

## 2023-10-17 NOTE — Progress Notes (Signed)
 Physical Therapy Treatment Patient Details Name: Phillip Young MRN: [id number] DOB: Jul 01, 1986 Today's Date: 10/17/2023   History of Present Illness Patient is 38 year old male who presented 10/10/23 with edema, dyspnea, weight gain and missed HD.  PMH: morbid obesity, HTN, prediabetes, OSA on home BiPAP, chronic respirator failure on 2-4L home oxygen, chronic systolic heart failure due to NICM, ESRD, and PAF.    PT Comments  Pt is sitting EoB eyes wide open or rolling back. Pt reports he has just had pain medication and is feeling drowsy. With some encouragement pt is willing to get back in bed. Pt linen is dirty and therapist changing linens. Pt does not follow commands to wait and rolls back into bed requiring increased effort to remove linens out from under him, causing him to swear at therapists. With increased effort PT and Rehab Tech able to get linens out from under him. Assisted pt in setting up BiPAP before leaving the room. PT will continue to follow acutely.    If plan is discharge home, recommend the following: A lot of help with walking and/or transfers;A little help with bathing/dressing/bathroom;Assistance with cooking/housework;Assist for transportation;Help with stairs or ramp for entrance   Can travel by private vehicle     Yes  Equipment Recommendations  Rollator (4 wheels) (bariatric - per pt request)    Recommendations for Other Services Rehab consult     Precautions / Restrictions Precautions Precautions: Fall Recall of Precautions/Restrictions: Impaired Precaution/Restrictions Comments: watch o2/HR Restrictions Weight Bearing Restrictions Per Provider Order: No     Mobility  Bed Mobility Overal bed mobility: Needs Assistance Bed Mobility: Sit to Supine       Sit to supine: Min assist, +2 for physical assistance   General bed mobility comments: pt does not want to attempt to scoot his hips up in bed, leansback and hoists L LE into bed, but needs assist for  management of LE into bed    Transfers                   General transfer comment: refuses transfer to scoot towards HoB       Balance Overall balance assessment: Needs assistance Sitting-balance support: Feet supported Sitting balance-Leahy Scale: Fair Sitting balance - Comments: pt sitting EoB dosing off, reports he just had pain medication and muscle relaxant, has difficult time with following conversation. Ultimately, even pt thinks it would be safer to                                    Communication Communication Communication: No apparent difficulties Factors Affecting Communication: Reduced clarity of speech;Difficulty expressing self (Pt reported that they have difficulties with speech due to damaged voice box and speaked softly until the end of the session.)  Cognition Arousal: Alert Behavior During Therapy: WFL for tasks assessed/performed   PT - Cognitive impairments: Safety/Judgement                       PT - Cognition Comments: pt with little to no command follow, pt does not want any input on how to get himself back in bed Following commands: Impaired Following commands impaired: Follows multi-step commands inconsistently (selectively follows commands)    Cueing Cueing Techniques: Verbal cues     General Comments General comments (skin integrity, edema, etc.): HR varied from 90-130s reports of dizziness      Pertinent Vitals/Pain  Pain Assessment Pain Assessment: Faces Faces Pain Scale: Hurts even more Pain Location: BLE with transfer of LE back into bed Pain Descriptors / Indicators: Discomfort, Heaviness, Pressure Pain Intervention(s): Limited activity within patient's tolerance, Monitored during session, Repositioned     PT Goals (current goals can now be found in the care plan section) Acute Rehab PT Goals Patient Stated Goal: improve independence PT Goal Formulation: With patient Time For Goal Achievement:  10/26/23 Potential to Achieve Goals: Fair Progress towards PT goals: Not progressing toward goals - comment (limited by lethargy)    Frequency    Min 2X/week       AM-PAC PT "6 Clicks" Mobility   Outcome Measure  Help needed turning from your back to your side while in a flat bed without using bedrails?: A Little Help needed moving from lying on your back to sitting on the side of a flat bed without using bedrails?: A Little Help needed moving to and from a bed to a chair (including a wheelchair)?: A Little Help needed standing up from a chair using your arms (e.g., wheelchair or bedside chair)?: A Lot Help needed to walk in hospital room?: Total Help needed climbing 3-5 steps with a railing? : Total 6 Click Score: 13    End of Session Equipment Utilized During Treatment: Oxygen Activity Tolerance: Patient limited by fatigue;Other (comment) (and impaired command follow) Patient left: in bed;with call bell/phone within reach (MD ok'd no alarm per pt adament refusal/request) Nurse Communication: Mobility status PT Visit Diagnosis: Unsteadiness on feet (R26.81);Other abnormalities of gait and mobility (R26.89);Muscle weakness (generalized) (M62.81);Difficulty in walking, not elsewhere classified (R26.2);Pain Pain - Right/Left: Right (Bilateral) Pain - part of body: Leg;Ankle and joints of foot     Time: 1419-1445 PT Time Calculation (min) (ACUTE ONLY): 26 min  Charges:    $Therapeutic Activity: 23-37 mins PT General Charges $$ ACUTE PT VISIT: 1 Visit                     Phillip Young Phillip Young PT, DPT Acute Rehabilitation Services Please use secure chat or  Call Office [phone]    Phillip Young [facility] 10/17/2023, 3:37 PM

## 2023-10-17 NOTE — Progress Notes (Signed)
 PT Cancellation Note  Patient Details Name: Phillip Young MRN: [id number] DOB: February 09, 1986   Cancelled Treatment:    Reason Eval/Treat Not Completed: (P) Patient at procedure or test/unavailable Pt is off floor for dialysis. PT will follow back this afternoon as able.   Reyhan Moronta B. Beverely Risen PT, DPT Acute Rehabilitation Services Please use secure chat or  Call Office [phone]    Elon Alas [facility] 10/17/2023, 8:31 AM

## 2023-10-17 NOTE — TOC Progression Note (Signed)
 Transition of Care [facility]) - Progression Note    Patient Details  Name: Phillip Young MRN: [id number] Date of Birth: 1986/03/09  Transition of [facility]) CM/SW Contact  Delilah Shan, LCSWA Phone Number: 10/17/2023, 12:55 PM  Clinical Narrative:     CSW provided patient with SNF bed offers at bedside. Patient was speaking with his mother by phone. Patient and patients mother request to review SNF bed offers and will follow back up with CSW with SNF choice. CSW will continue to follow and assist with patients dc planning needs.   Expected Discharge Plan: Home w Home Health Services Barriers to Discharge: Continued Medical Work up  Expected Discharge Plan and Services   Discharge Planning Services: CM Consult Post Acute Care Choice: Home Health Living arrangements for the past 2 months: Apartment                           HH Arranged: RN, Disease Management, OT, Nurse's Aide HH Agency: [facility] Date HH Agency Contacted: 10/13/23 Time HH Agency Contacted: 1130 Representative spoke with at [facility] Agency: Adele Dan   Social Determinants of Health (SDOH) Interventions SDOH Screenings   Food Insecurity: Food Insecurity Present (10/11/2023)  Housing: High Risk (10/11/2023)  Transportation Needs: Unmet Transportation Needs (10/11/2023)  Utilities: At Risk (10/11/2023)  Alcohol Screen: Low Risk  (10/06/2023)  Depression (PHQ2-9): High Risk (10/06/2023)  Financial Resource Strain: Low Risk  (10/06/2023)  Physical Activity: Insufficiently Active (10/06/2023)  Social Connections: Socially Isolated (10/06/2023)  Stress: No Stress Concern Present (10/06/2023)  Tobacco Use: Medium Risk (10/11/2023)  Health Literacy: Adequate Health Literacy (10/06/2023)    Readmission Risk Interventions    10/13/2023   11:27 AM 09/23/2023    5:05 PM 07/05/2023    4:25 PM  Readmission Risk Prevention Plan  Transportation Screening Complete Complete Complete  HRI or Home Care Consult    Complete  Social Work Consult for Recovery Care Planning/Counseling   Complete  Palliative Care Screening   Not Applicable  Medication Review Oceanographer) Referral to Pharmacy Referral to Pharmacy Complete  PCP or Specialist appointment within 3-5 days of discharge  Complete   HRI or Home Care Consult Complete Complete   SW Recovery Care/Counseling Consult Complete Complete   Palliative Care Screening Not Applicable Not Applicable   Skilled Nursing Facility Not Applicable Not Applicable

## 2023-10-17 NOTE — Consult Note (Signed)
 WOC Nurse Consult Note: Reason for Consult: Requested to assess a sacrum wound stage 2 Wound type: Rash on back, dry skin.   No skin breakdown and no PI on sacrum. Notice a abscess forming with 5 x 7 cm indurated area. According to the pt, he had already the same problem, that was drainage by a general surgeon. Scar for the last surgery on coccyx. Pressure Injury POA: NA Drainage (amount, consistency, odor) NONE  Dressing procedure/placement/frequency: No dressing is needed. Apply moisture cream daily on the dry skin, specially on his back.  Sent it a Mora about his history with a previous abscess on coccyx and the actual status. The team will assess him tomorrow.  WOC team will not plan to follow further.  Please reconsult if further assistance is needed. Thank-you,  Denyse Amass BSN, RN, [employer], WOC  (Pager: [phone])

## 2023-10-17 NOTE — Progress Notes (Signed)
 Pt satting 100 percent on 4L. Pt requesting 6L. Notified NP-Anderson and provider ok with pt at 6L. Pt changed to 6L.

## 2023-10-17 NOTE — Progress Notes (Signed)
 HD#7 SUBJECTIVE:  Patient Summary: Phillip Young is a 38 y.o. with a pertinent PMH of  chronic hypoxic respiratory failure on 2-4 L home O2, HFrEF, ESRD on dialysis who presents for concerns of shortness of breath and admitted for hypervolemia.   Overnight Events: None   Interim History: Patient is evaluated at bedside as he is receiving dialysis. Reports BLE tenderness. States that breathing is about the same, no improvement.   OBJECTIVE:  Vital Signs: Vitals:   10/16/23 1919 10/16/23 2323 10/17/23 0449 10/17/23 0501  BP: (!) 123/96 101/83 101/78   Pulse: (!) 101 60 (!) 113   Resp: 17 (!) 25 (!) 23   Temp: 97.9 F (36.6 C) 98.2 F (36.8 C) 97.9 F (36.6 C)   TempSrc: Oral Axillary Oral   SpO2: 100% 100% 100%   Weight:    (!) 187 kg  Height:       Supplemental O2: Nasal Cannula SpO2: 100 % O2 Flow Rate (L/min): 6 L/min  Filed Weights   10/14/23 1410 10/14/23 1810 10/17/23 0501  Weight: (!) 185.8 kg (!) 182.2 kg (!) 187 kg     Intake/Output Summary (Last 24 hours) at 10/17/2023 0715 Last data filed at 10/16/2023 1200 Gross per 24 hour  Intake 354 ml  Output --  Net 354 ml   Net IO Since Admission: -2,160 mL [10/17/23 0715]  Physical Exam: Physical Exam  General: Laying in bed. No acute distress  Cardiovascular: Regular rate Pulmonary: Breathing comfortably, no wheezing or crackles Abdomen: Obese, Soft, NT, ND.  MSK: Bilateral lower extremity edema, tender to touch  Patient Lines/Drains/Airways Status     Active Line/Drains/Airways     Name Placement date Placement time Site Days   Peripheral IV 10/10/23 22 G Posterior;Right Forearm 10/10/23  --  Forearm  7   Hemodialysis Catheter Right Internal jugular Double lumen Permanent (Tunneled) 05/30/23  0856  Internal jugular  140   Pressure Injury 10/16/23 Buttocks Right;Lower Stage 2 -  Partial thickness loss of dermis presenting as a shallow open injury with a red, pink wound bed without slough. 10/16/23  1200   -- 1   Wound / Incision (Open or Dehisced) 08/31/23 Other (Comment) Thigh Left;Posterior partial thickness blisters 08/31/23  --  Thigh  47   Wound / Incision (Open or Dehisced) 09/22/23 Non-pressure wound Toe (Comment  which one) Right R great toenail removed 09/22/23  1930  Toe (Comment  which one)  25             ASSESSMENT/PLAN:  Assessment: Principal Problem:   Acute on chronic hypoxic respiratory failure ([name]) Active Problems:   Morbid obesity ([name])   Chronic systolic CHF (congestive heart failure) ([name])   Chronic respiratory failure with hypoxia ([name])   Paroxysmal atrial flutter ([name])   ESRD (end stage renal disease) on dialysis ([name])   Hypotension   ESRD on dialysis ([name])   Hypervolemia   Type 2 diabetes mellitus with diabetic chronic kidney disease ([name])   Chronic a-fib ([name])   Sacral wound   Plan: Acute on chronic hypoxic respiratory failure, on home 2-4 L oxygen Pulmonary edema Hx presumed amiodarone toxicity HFrEF, LVEF <20% ESRD on HD  Hyperphosphatemia  HAGMA  Evaluated during dialysis this AM, reported BLE tenderness, still appears extremely edematous, will benefit with serial dialysis sessions to remove volume.  - HD today    - Wean oxygen as tolerated, baseline 3-4L - Continue digoxin 0.125 mg 3 times daily on Monday, Wednesday, Friday - Continue  Renvela 1600 mg 3 times daily with meals - Continue Cinacelet 30 mg daily on dialysis days - TOC pending SNF at [facility]    Muscle cramping Reported that it helped during dialysis.  - Robaxin 500 mg MWF during HD    Hypotension Likely due to above with underlying vasoplegia. Continue home medication midodrine 30 mg TID    Anemia of chronic disease Hgb stable at 10.5; No signs of blood loss. Continue to monitor - Aranesp 150 mcg every friday   Sacral Decubitus Ulcer Stage 2 Indurated abscesses Multiple areas of pressure ulcers along inner thigh  - Apply warm compresses around the induration site  to facilitate drainage - Clindamycin topical gel for sacrum - Wound care consult for further management of the ulcers    Paroxysmal atrial fibrillation - Telemetry monitoring - Continue home Eliquis 5mg  BID, Digoxin 0.125mg  MWF   Hyponatremia Na 131,  likely due to hypervolemia and underlying ESRD  - Trend    Chronic somatic and neuropathic pain Chronic opiate therapy It has been attempted to switch away from chronic oxycodone therapy particularly with history of ESRD, however patient reports dilaudid has been less effective and requested continued oxycodone.  - Continue venlafaxine 37.5mg  daily, lyrica 50mg  daily, oxycodone 10mg  q6h prn.  - Continue Tylenol 1000 mg every 6 hours for pain control - Comtinue PT/OT   Hypothyroidism Continue home levothyroxine   Depression Continue home venlafaxine 37.5mg   Best Practice: Diet: Renal diet IVF: Fluids: None VTE: Eliquis 5 mg BID  Code: Full AB: none Wounds: as above  Therapy Recs: SNF, DME: none Family Contact: mother, called and notified. DISPO: Anticipated discharge  2-3 days  to Skilled nursing facility pending  medical management .  Signature: Jeral Pinch, D.O.  Internal Medicine Resident, PGY-1 [facility]  Pager: [phone] 7:15 AM, 10/17/2023   Please contact the on call pager after 5 pm and on weekends at [phone].

## 2023-10-17 NOTE — Addendum Note (Signed)
 Addended by: Burnell Blanks on: 10/17/2023 01:55 PM   Modules accepted: Level of Service

## 2023-10-17 NOTE — Progress Notes (Signed)
 Internal Medicine Clinic Attending  Case discussed with the resident at the time of the visit.  We reviewed the resident's history and exam and pertinent patient test results.  I agree with the assessment, diagnosis, and plan of care documented in the resident's note.

## 2023-10-17 NOTE — Procedures (Signed)
 I was present at this dialysis session. I have reviewed the session itself and made appropriate changes.   Vital signs in last 24 hours:  Temp:  [97.9 F (36.6 C)-98.4 F (36.9 C)] 98.4 F (36.9 C) (03/24 0800) Pulse Rate:  [60-113] 90 (03/24 0815) Resp:  [12-25] 21 (03/24 0815) BP: (99-140)/(74-96) 99/81 (03/24 0815) SpO2:  [95 %-100 %] 100 % (03/24 0815) Weight:  [187 kg-192 kg] 192 kg (03/24 0800) Weight change:  Filed Weights   10/17/23 0501 10/17/23 0800  Weight: (!) 187 kg (!) 192 kg    Recent Labs  Lab 10/17/23 0524  NA 131*  K 4.8  CL 96*  CO2 17*  GLUCOSE 114*  BUN 69*  CREATININE 9.68*  CALCIUM 9.7  PHOS 8.4*    Recent Labs  Lab 10/10/23 1553 10/12/23 0402 10/14/23 0430 10/15/23 0358 10/17/23 0755  WBC 9.3   < > 7.0 7.7 7.9  NEUTROABS 6.9  --   --   --   --   HGB 10.3*   < > 10.3* 10.6* 10.5*  HCT 31.6*   < > 30.9* 32.9* 32.5*  MCV 104.6*   < > 101.3* 103.5* 105.5*  PLT 178   < > 191 200 207   < > = values in this interval not displayed.    Scheduled Meds:  acetaminophen  1,000 mg Oral Q6H   apixaban  5 mg Oral BID   Chlorhexidine Gluconate Cloth  6 each Topical Q0600   clindamycin   Topical BID   darbepoetin (ARANESP) injection - DIALYSIS  150 mcg Subcutaneous Q Fri-1800   digoxin  0.125 mg Oral Once per day on Monday Wednesday Friday   levothyroxine  25 mcg Oral Q0600   methocarbamol  500 mg Oral Q M,W,F-HD   midodrine  30 mg Oral TID WC   pregabalin  50 mg Oral Daily   sevelamer carbonate  1,600 mg Oral TID WC   venlafaxine XR  37.5 mg Oral Daily   Continuous Infusions:  anticoagulant sodium citrate     PRN Meds:.cinacalcet, oxyCODONE   Irena Cords,  MD 10/17/2023, 9:15 AM

## 2023-10-17 NOTE — Plan of Care (Signed)

## 2023-10-17 NOTE — Progress Notes (Signed)
   10/17/23 1205  Vitals  Temp 97.6 F (36.4 C)  Pulse Rate (!) 108  Resp (!) 22  BP 118/79  SpO2 100 %  O2 Device Nasal Cannula  Weight (!) 188.4 kg  Type of Weight Post-Dialysis  Oxygen Therapy  O2 Flow Rate (L/min) 4 L/min  Patient Activity (if Appropriate) In bed  Pulse Oximetry Type Continuous  Oximetry Probe Site Changed No  Post Treatment  Dialyzer Clearance Lightly streaked  Liters Processed 84  Fluid Removed (mL) 4000 mL  Tolerated HD Treatment Yes   Received patient in bed to unit.  Alert and oriented.  Informed consent signed and in chart.   TX duration: Three hours and thirty minutes  Patient tolerated well.  Transported back to the room  Alert, without acute distress.  Hand-off given to patient's nurse.   Access used: Right chest catheter Access issues: none  Medication(s) given: oxycodone 10mg

## 2023-10-18 ENCOUNTER — Ambulatory Visit

## 2023-10-18 ENCOUNTER — Telehealth: Payer: Self-pay | Admitting: *Deleted

## 2023-10-18 DIAGNOSIS — I5020 Unspecified systolic (congestive) heart failure: Secondary | ICD-10-CM

## 2023-10-18 DIAGNOSIS — R252 Cramp and spasm: Secondary | ICD-10-CM

## 2023-10-18 DIAGNOSIS — I480 Paroxysmal atrial fibrillation: Secondary | ICD-10-CM

## 2023-10-18 DIAGNOSIS — D638 Anemia in other chronic diseases classified elsewhere: Secondary | ICD-10-CM

## 2023-10-18 DIAGNOSIS — L89159 Pressure ulcer of sacral region, unspecified stage: Secondary | ICD-10-CM

## 2023-10-18 DIAGNOSIS — J811 Chronic pulmonary edema: Secondary | ICD-10-CM

## 2023-10-18 DIAGNOSIS — E871 Hypo-osmolality and hyponatremia: Secondary | ICD-10-CM

## 2023-10-18 DIAGNOSIS — I959 Hypotension, unspecified: Secondary | ICD-10-CM

## 2023-10-18 LAB — CBC
HCT: 32.7 % — ABNORMAL LOW (ref 39.0–52.0)
Hemoglobin: 10.5 g/dL — ABNORMAL LOW (ref 13.0–17.0)
MCH: 33.4 pg (ref 26.0–34.0)
MCHC: 32.1 g/dL (ref 30.0–36.0)
MCV: 104.1 fL — ABNORMAL HIGH (ref 80.0–100.0)
Platelets: 180 10*3/uL (ref 150–400)
RBC: 3.14 MIL/uL — ABNORMAL LOW (ref 4.22–5.81)
RDW: 17.7 % — ABNORMAL HIGH (ref 11.5–15.5)
WBC: 7.1 10*3/uL (ref 4.0–10.5)
nRBC: 0 % (ref 0.0–0.2)

## 2023-10-18 LAB — RENAL FUNCTION PANEL
Albumin: 2.9 g/dL — ABNORMAL LOW (ref 3.5–5.0)
Anion gap: 15 (ref 5–15)
BUN: 50 mg/dL — ABNORMAL HIGH (ref 6–20)
CO2: 22 mmol/L (ref 22–32)
Calcium: 9.3 mg/dL (ref 8.9–10.3)
Chloride: 94 mmol/L — ABNORMAL LOW (ref 98–111)
Creatinine, Ser: 8 mg/dL — ABNORMAL HIGH (ref 0.61–1.24)
GFR, Estimated: 8 mL/min — ABNORMAL LOW (ref >60)
Glucose, Bld: 95 mg/dL (ref 70–99)
Phosphorus: 7.1 mg/dL — ABNORMAL HIGH (ref 2.5–4.6)
Potassium: 4.3 mmol/L (ref 3.5–5.1)
Sodium: 131 mmol/L — ABNORMAL LOW (ref 135–145)

## 2023-10-18 MED ORDER — CHLORHEXIDINE GLUCONATE CLOTH 2 % EX PADS
6.0000 | MEDICATED_PAD | Freq: Every day | CUTANEOUS | Status: DC
  Administered 2023-10-18 – 2023-10-20 (×3): 6 via TOPICAL

## 2023-10-18 NOTE — Telephone Encounter (Signed)
 Received PCS form for dis enrollment for this patient / patient's insurance has been changed and we are uanble at this time to complete the form until we receive patient new insurance information. Phillip Young is going to contact with patient's mother who is working on getting his insurance corrected.

## 2023-10-18 NOTE — Progress Notes (Addendum)
 HD#8 SUBJECTIVE:  Patient Summary: Phillip Young is a 38 y.o. with a pertinent PMH of  chronic hypoxic respiratory failure on 2-4 L home O2, HFrEF, ESRD on dialysis who presents for concerns of shortness of breath and admitted for hypervolemia.   Overnight Events: None  Interim History: Patient is evaluated at bedside, reports that swelling has gone down of his lower extremities.  His mother was on call, she wanted 1) to reach out to social workers help patient get full Medicaid 2) hide patient evaluated for electric wheelchair in the hospital.  OBJECTIVE:  Vital Signs: Vitals:   10/17/23 1205 10/17/23 1217 10/17/23 2039 10/18/23 0455  BP: 118/79 95/67 (!) 109/98 113/78  Pulse: (!) 108 (!) 102 (!) 106 (!) 118  Resp: (!) 22 14  18   Temp: 97.6 F (36.4 C) (!) 97.5 F (36.4 C) 98.4 F (36.9 C) 97.6 F (36.4 C)  TempSrc:  Oral Oral Oral  SpO2: 100% 100% 99% 100%  Weight: (!) 188.4 kg     Height:       Supplemental O2: Nasal Cannula SpO2: 100 % O2 Flow Rate (L/min): 4 L/min  Filed Weights   10/17/23 0501 10/17/23 0800 10/17/23 1205  Weight: (!) 187 kg (!) 192 kg (!) 188.4 kg     Intake/Output Summary (Last 24 hours) at 10/18/2023 0759 Last data filed at 10/17/2023 1205 Gross per 24 hour  Intake --  Output 4000 ml  Net -4000 ml   Net IO Since Admission: -6,160 mL [10/18/23 0759]  Physical Exam: Physical Exam  General: Laying in bed. No acute distress  Cardiovascular: Regular rate Pulmonary: Breathing comfortably, no wheezing or crackles Abdomen: Obese, Soft, NT, ND.  MSK: Bilateral lower extremity edema, tender to touch Skin: Areas of induration noted sacrum that is draining, images below.       Patient Lines/Drains/Airways Status     Active Line/Drains/Airways     Name Placement date Placement time Site Days   Peripheral IV 10/10/23 22 G Posterior;Right Forearm 10/10/23  --  Forearm  8   Hemodialysis Catheter Right Internal jugular Double lumen Permanent  (Tunneled) 05/30/23  0856  Internal jugular  141   Pressure Injury 10/16/23 Buttocks Right;Lower Stage 2 -  Partial thickness loss of dermis presenting as a shallow open injury with a red, pink wound bed without slough. 10/16/23  1200  -- 2   Wound / Incision (Open or Dehisced) 08/31/23 Other (Comment) Thigh Left;Posterior partial thickness blisters 08/31/23  --  Thigh  48   Wound / Incision (Open or Dehisced) 09/22/23 Non-pressure wound Toe (Comment  which one) Right R great toenail removed 09/22/23  1930  Toe (Comment  which one)  26             ASSESSMENT/PLAN:  Assessment: Principal Problem:   Acute on chronic hypoxic respiratory failure ([name]) Active Problems:   Morbid obesity ([name])   Chronic systolic CHF (congestive heart failure) ([name])   Chronic respiratory failure with hypoxia ([name])   Paroxysmal atrial flutter ([name])   ESRD (end stage renal disease) on dialysis ([name])   Hypotension   ESRD on dialysis ([name])   Hypervolemia   Type 2 diabetes mellitus with diabetic chronic kidney disease ([name])   Chronic a-fib ([name])   Sacral wound  Acute on chronic hypoxic respiratory failure, on home 2-4 L oxygen Pulmonary edema Hx presumed amiodarone toxicity HFrEF, LVEF <20% ESRD on HD  Hyperphosphatemia  HAGMA  Patient HD session yesterday with 4.0 L UF. Patient  reports decrease in swelling.  Nephrology on board, appreciate their recommendations. - Next HD session scheduled 3/26 - Wean oxygen as tolerated, baseline 3-4L - Continue digoxin 0.125 mg 3 times daily on Monday, Wednesday, Friday - Continue Renvela 1600 mg 3 times daily with meals - Continue Cinacelet 30 mg daily on dialysis days - TOC on board, patient does not want to go to SNF. Unfortunately, he can not be screen inpatient for electric wheelchair.    Muscle cramping Reported that it helped during dialysis.  - Robaxin 500 mg MWF during HD    Hypotension Likely due to above with underlying vasoplegia. Continue home  medication midodrine 30 mg TID    Anemia of chronic disease Hgb stable at 10.5; No signs of blood loss. Continue to monitor - Aranesp 150 mcg every friday   Sacral Decubitus Ulcer Indurated abscess  Hx of hidradenitis suppurativa Per evaluation today, patient has indurated abscess at sacrum that is draining. The inner thighs were evaluated today that did not show any ulcerations at this time. He has few focal areas of grade 1-2 small ulcerations.  - Apply warm compresses around the induration site to facilitate drainage - Clindamycin topical gel for sacrum   Paroxysmal atrial fibrillation - Telemetry monitoring - Continue home Eliquis 5mg  BID, Digoxin 0.125mg  MWF   Hyponatremia Na 131,  likely due to hypervolemia and underlying ESRD  - Trend    Chronic somatic and neuropathic pain Chronic opiate therapy It has been attempted to switch away from chronic oxycodone therapy particularly with history of ESRD, however patient reports dilaudid has been less effective and requested continued oxycodone.  - Continue venlafaxine 37.5mg  daily, lyrica 50mg  daily, oxycodone 10mg  q6h prn.  - Continue Tylenol 1000 mg every 6 hours for pain control - Continue PT/OT   Hypothyroidism Continue home levothyroxine   Depression Continue home venlafaxine 37.5mg   Best Practice: Diet: Renal diet ZOX:WRUE VTE: Eliquis 5 mg BID Code: Full AB: none Wounds: as above  Therapy Recs: SNF vs home  Family Contact: mother, over call DISPO: Anticipated discharge  2-6 days  to  pending  pending  HD .  Signature: Jeral Pinch, D.O.  Internal Medicine Resident, PGY-1 [facility]  Pager: [phone] 7:59 AM, 10/18/2023   Please contact the on call pager after 5 pm and on weekends at [phone].

## 2023-10-18 NOTE — TOC Transition Note (Signed)
 Transition of Care [facility]) - Discharge Note   Patient Details  Name: Phillip Young MRN: [id number] Date of Birth: 06-25-1986  [facility] of [facility]) CM/SW Contact:  Gala Lewandowsky, RN Phone Number: 10/18/2023, 12:21 PM   Clinical Narrative: Case Manager spoke with patient, his mother, and the provider regarding motorized wheelchair. Patient had an appointment today at the Neurology office on [address] for consultation for motorized wheelchair. Case Manager did make the provider aware that the PCP or consulting provider will have to work with the patient regarding the motorized wheelchair and extended paperwork. Case Manager does not assist with ordering this DME. MD Allena Katz is reaching out to Neurology office to seek further advice on the wheelchair consultation. Mother is reporting that the patient can return home once stable with home health services once stable. Home Health is arranged with [facility] for PT/OT/Aide/RN. Case Manager will continue to follow for additional needs.   Final next level of care: Home w Home Health Services Barriers to Discharge: Continued Medical Work up  Patient Goals and CMS Choice Patient states their goals for this hospitalization and ongoing recovery are:: plan to to return home once stable.   Discharge Plan and Services Additional resources added to the After Visit Summary for     Discharge Planning Services: CM Consult Post Acute Care Choice: Home Health           HH Arranged: RN, Disease Management, OT, Nurse's Aide HH Agency: [facility] Date HH Agency Contacted: 10/13/23 Time HH Agency Contacted: 1130 Representative spoke with at [facility] Agency: Adele Dan  Social Drivers of Health (SDOH) Interventions SDOH Screenings   Food Insecurity: Food Insecurity Present (10/11/2023)  Housing: High Risk (10/11/2023)  Transportation Needs: Unmet Transportation Needs (10/11/2023)  Utilities: At Risk (10/11/2023)  Alcohol Screen: Low  Risk  (10/06/2023)  Depression (PHQ2-9): High Risk (10/06/2023)  Financial Resource Strain: Low Risk  (10/06/2023)  Physical Activity: Insufficiently Active (10/06/2023)  Social Connections: Socially Isolated (10/06/2023)  Stress: No Stress Concern Present (10/06/2023)  Tobacco Use: Medium Risk (10/11/2023)  Health Literacy: Adequate Health Literacy (10/06/2023)   Readmission Risk Interventions    10/13/2023   11:27 AM 09/23/2023    5:05 PM 07/05/2023    4:25 PM  Readmission Risk Prevention Plan  Transportation Screening Complete Complete Complete  HRI or Home Care Consult   Complete  Social Work Consult for Recovery Care Planning/Counseling   Complete  Palliative Care Screening   Not Applicable  Medication Review Oceanographer) Referral to Pharmacy Referral to Pharmacy Complete  PCP or Specialist appointment within 3-5 days of discharge  Complete   HRI or Home Care Consult Complete Complete   SW Recovery Care/Counseling Consult Complete Complete   Palliative Care Screening Not Applicable Not Applicable   Skilled Nursing Facility Not Applicable Not Applicable

## 2023-10-18 NOTE — Progress Notes (Signed)
 Update regarding patient evaluation for wheelchair.  This wheelchair evaluation he was supposed to get was today, and missed the appointment, I called the Neuro rehab place who said there there is nothing that they can do for him inpatient and well need to reschedule his evaluation when he gets out of the hospital. I asked if they came come here to evaluate him and they said no. Then I called the wheelchair place ( [employer] [phone]) to see if they can help coordinate potentially an inpatient visit to see if they can evaluate him and they also said no. He will need to call them to reschedule for a time that works for him when he gets out of the hospital. Even after the evaluation it is going to take some time for him to actually get the wheelchair as we will need to get the approval and get it through his pcp office.  [name] notified of all the details via phone call.

## 2023-10-18 NOTE — Progress Notes (Signed)
 Pt receives out-pt HD at [facility] on MWF 11:55 am chair time. Will assist as needed.   Olivia Canter Renal Navigator [phone]

## 2023-10-18 NOTE — Progress Notes (Addendum)
 [facility] Progress Note   Subjective:    Seen and examined patient at bedside. Tolerated yesterday's HD with net UF 4L. Next HD 3/26.  Objective Vitals:   10/17/23 1217 10/17/23 2039 10/18/23 0455 10/18/23 0823  BP: 95/67 (!) 109/98 113/78 108/75  Pulse: (!) 102 (!) 106 (!) 118 76  Resp: 14  18 18   Temp: (!) 97.5 F (36.4 C) 98.4 F (36.9 C) 97.6 F (36.4 C) (!) 97.5 F (36.4 C)  TempSrc: Oral Oral Oral Oral  SpO2: 100% 99% 100%   Weight:      Height:       Physical Exam Gen alert, no distress, nasal oxygen 6L Chest clear bilat to bases, no rales/ wheezing Tachy  Abd soft ntnd no mass or ascites  Ext 1+ LE edema (edema appears to be coming down) Neuro is alert, Ox 3 , nf TDC intact  Filed Weights   10/17/23 0501 10/17/23 0800 10/17/23 1205  Weight: (!) 187 kg (!) 192 kg (!) 188.4 kg    Intake/Output Summary (Last 24 hours) at 10/18/2023 0908 Last data filed at 10/17/2023 1205 Gross per 24 hour  Intake --  Output 4000 ml  Net -4000 ml    Additional Objective Labs: Basic Metabolic Panel: Recent Labs  Lab 10/16/23 0338 10/17/23 0524 10/18/23 0430  NA 131* 131* 131*  K 4.6 4.8 4.3  CL 95* 96* 94*  CO2 18* 17* 22  GLUCOSE 130* 114* 95  BUN 53* 69* 50*  CREATININE 8.75* 9.68* 8.00*  CALCIUM 9.7 9.7 9.3  PHOS 7.6* 8.4* 7.1*   Liver Function Tests: Recent Labs  Lab 10/16/23 0338 10/17/23 0524 10/18/23 0430  ALBUMIN 3.2* 3.2* 2.9*   No results for input(s): "LIPASE", "AMYLASE" in the last 168 hours. CBC: Recent Labs  Lab 10/13/23 0442 10/14/23 0430 10/15/23 0358 10/17/23 0755 10/18/23 0430  WBC 7.0 7.0 7.7 7.9 7.1  HGB 10.6* 10.3* 10.6* 10.5* 10.5*  HCT 32.5* 30.9* 32.9* 32.5* 32.7*  MCV 102.5* 101.3* 103.5* 105.5* 104.1*  PLT 204 191 200 207 180   Blood Culture    Component Value Date/Time   SDES BLOOD BLOOD RIGHT HAND 05/27/2023 0959   SPECREQUEST  05/27/2023 0959    BOTTLES DRAWN AEROBIC AND ANAEROBIC Blood Culture  adequate volume   CULT  05/27/2023 0959    NO GROWTH 5 DAYS Performed at [facility], [address], [place], [place] [address]    REPTSTATUS 06/01/2023 FINAL 05/27/2023 2956    Cardiac Enzymes: No results for input(s): "CKTOTAL", "CKMB", "CKMBINDEX", "TROPONINI" in the last 168 hours. CBG: No results for input(s): "GLUCAP" in the last 168 hours. Iron Studies: No results for input(s): "IRON", "TIBC", "TRANSFERRIN", "FERRITIN" in the last 72 hours. Lab Results  Component Value Date   INR 2.0 (H) 06/10/2022   INR 2.8 (H) 06/09/2022   INR 2.9 (H) 06/07/2022   Studies/Results: No results found.  Medications:  anticoagulant sodium citrate      acetaminophen  1,000 mg Oral Q6H   apixaban  5 mg Oral BID   Chlorhexidine Gluconate Cloth  6 each Topical Q0600   clindamycin   Topical BID   darbepoetin (ARANESP) injection - DIALYSIS  150 mcg Subcutaneous Q Fri-1800   digoxin  0.125 mg Oral Once per day on Monday Wednesday Friday   hydrocerin   Topical Daily   levothyroxine  25 mcg Oral Q0600   methocarbamol  500 mg Oral Q M,W,F-HD   midodrine  30 mg  Oral TID WC   pregabalin  50 mg Oral Daily   sevelamer carbonate  1,600 mg Oral TID WC   venlafaxine XR  37.5 mg Oral Daily    Dialysis Orders: MWF [facility] Needs updating  4.5H B400    155.5 kg   2.0 bath TDC   No heparin (history of HIT) -Patient does not accept blood products -Micera Q2wks - last dose 09/30/23   Renal-related home meds: Sensipar 30 mg MWF Midodrine 30 mg 3 times daily Renvela 1600 mg 3 times daily AC Others: Venlafaxine, Lyrica, oxycodone IR, Synthroid, digoxin, Eliquis, Narcan as needed, Zepbound  Assessment/Plan: Acute on chronic hypoxic respiratory failure: With pulmonary edema on chest x-ray. Lower extremity edema is coming down. Continue to lower volume with HD as able.  ESRD - on HD MWF.  Received HD 3/18, 3/20, 3/21. Not able to do serial HD today d/t high inpatient census for dialysis. Next HD  3/26.  BP -chronic hypotension on high-dose midodrine at home.  Continue. Volume -Chronic volume overload.  Manage volume with HD. Tolerating 4L UF  Anemia of esrd -hemoglobin 10-11 here, follow. Aranesp 200 ordered for 3/21.  Secondary hyperparathyroidism -calcium in range, Phos a bit high.  Continue binders with meals Chronic pain syndrome Atrial fibrillation - on Eliquis, digoxin Neuropathy - per primary  Dispo - pending   Salome Holmes, NP [facility] 10/18/2023,9:08 AM  LOS: 8 days

## 2023-10-19 LAB — RENAL FUNCTION PANEL
Albumin: 2.9 g/dL — ABNORMAL LOW (ref 3.5–5.0)
Anion gap: 14 (ref 5–15)
BUN: 64 mg/dL — ABNORMAL HIGH (ref 6–20)
CO2: 20 mmol/L — ABNORMAL LOW (ref 22–32)
Calcium: 9 mg/dL (ref 8.9–10.3)
Chloride: 97 mmol/L — ABNORMAL LOW (ref 98–111)
Creatinine, Ser: 9.39 mg/dL — ABNORMAL HIGH (ref 0.61–1.24)
GFR, Estimated: 7 mL/min — ABNORMAL LOW (ref >60)
Glucose, Bld: 108 mg/dL — ABNORMAL HIGH (ref 70–99)
Phosphorus: 7.3 mg/dL — ABNORMAL HIGH (ref 2.5–4.6)
Potassium: 4.5 mmol/L (ref 3.5–5.1)
Sodium: 131 mmol/L — ABNORMAL LOW (ref 135–145)

## 2023-10-19 LAB — CBC
HCT: 29.8 % — ABNORMAL LOW (ref 39.0–52.0)
Hemoglobin: 9.8 g/dL — ABNORMAL LOW (ref 13.0–17.0)
MCH: 33.8 pg (ref 26.0–34.0)
MCHC: 32.9 g/dL (ref 30.0–36.0)
MCV: 102.8 fL — ABNORMAL HIGH (ref 80.0–100.0)
Platelets: 186 10*3/uL (ref 150–400)
RBC: 2.9 MIL/uL — ABNORMAL LOW (ref 4.22–5.81)
RDW: 17.2 % — ABNORMAL HIGH (ref 11.5–15.5)
WBC: 7.3 10*3/uL (ref 4.0–10.5)
nRBC: 0 % (ref 0.0–0.2)

## 2023-10-19 LAB — MAGNESIUM: Magnesium: 2 mg/dL (ref 1.7–2.4)

## 2023-10-19 MED ORDER — HYDROMORPHONE HCL 2 MG PO TABS
1.0000 mg | ORAL_TABLET | Freq: Once | ORAL | Status: AC
  Administered 2023-10-19: 1 mg via ORAL
  Filled 2023-10-19: qty 1

## 2023-10-19 NOTE — Procedures (Signed)
 I was present at this dialysis session. I have reviewed the session itself and made appropriate changes.   Vital signs in last 24 hours:  Temp:  [97.3 F (36.3 C)-98 F (36.7 C)] 98 F (36.7 C) (03/26 0306) Pulse Rate:  [105-110] 105 (03/26 0306) Resp:  [13-19] 13 (03/26 0306) BP: (103-119)/(79-94) 115/87 (03/26 0306) SpO2:  [93 %-100 %] 100 % (03/26 0306) Weight change:  Filed Weights   10/17/23 0501 10/17/23 0800 10/17/23 1205  Weight: (!) 187 kg (!) 192 kg (!) 188.4 kg    Recent Labs  Lab 10/19/23 0507  NA 131*  K 4.5  CL 97*  CO2 20*  GLUCOSE 108*  BUN 64*  CREATININE 9.39*  CALCIUM 9.0  PHOS 7.3*    Recent Labs  Lab 10/17/23 0755 10/18/23 0430 10/19/23 0507  WBC 7.9 7.1 7.3  HGB 10.5* 10.5* 9.8*  HCT 32.5* 32.7* 29.8*  MCV 105.5* 104.1* 102.8*  PLT 207 180 186    Scheduled Meds:  acetaminophen  1,000 mg Oral Q6H   apixaban  5 mg Oral BID   Chlorhexidine Gluconate Cloth  6 each Topical Q0600   clindamycin   Topical BID   darbepoetin (ARANESP) injection - DIALYSIS  150 mcg Subcutaneous Q Fri-1800   digoxin  0.125 mg Oral Once per day on Monday Wednesday Friday   hydrocerin   Topical Daily   levothyroxine  25 mcg Oral Q0600   methocarbamol  500 mg Oral Q M,W,F-HD   midodrine  30 mg Oral TID WC   pregabalin  50 mg Oral Daily   sevelamer carbonate  1,600 mg Oral TID WC   venlafaxine XR  37.5 mg Oral Daily   Continuous Infusions:  anticoagulant sodium citrate     PRN Meds:.cinacalcet, oxyCODONE   Irena Cords,  MD 10/19/2023, 9:16 AM

## 2023-10-19 NOTE — Progress Notes (Signed)
 Physical Therapy Treatment Patient Details Name: Phillip Young MRN: [id number] DOB: 01-14-1986 Today's Date: 10/19/2023   History of Present Illness Patient is 38 year old male who presented 10/10/23 with edema, dyspnea, weight gain and missed HD.  PMH: morbid obesity, HTN, prediabetes, OSA on home BiPAP, chronic respirator failure on 2-4L home oxygen, chronic systolic heart failure due to NICM, ESRD, and PAF.    PT Comments  Pt sitting EoB after dialysis with 4+ pitting edema in bilateral feet, complaining of increased pain. Pt initially agreeable to work with therapy as he desires to walk. Pt attempts standing from slightly elevated bed surface and unable to come to standing. Requests that bed be elevated, PT elevates bed slightly higher. Pt request bed elevation to hip level. PT refuses due to safety. Pt upset but ultimately provided totalAx2 for >10 sec of standing, and sits back in bed. PT request pt get back in bed so feet can be elevated. Pt refuses. Pt educated on edema. Still refuses. Pt to discharge home via PTAR tomorrow.    If plan is discharge home, recommend the following: A lot of help with walking and/or transfers;A little help with bathing/dressing/bathroom;Assistance with cooking/housework;Assist for transportation;Help with stairs or ramp for entrance   Can travel by private vehicle     No  Equipment Recommendations  Rollator (4 wheels) (bariatric - per pt request)    Recommendations for Other Services Rehab consult     Precautions / Restrictions Precautions Precautions: Fall Recall of Precautions/Restrictions: Impaired Precaution/Restrictions Comments: watch o2/HR Restrictions Weight Bearing Restrictions Per Provider Order: No     Mobility  Bed Mobility               General bed mobility comments: sitting EoB with severly swollen feet    Transfers Overall transfer level: Needs assistance Equipment used: Rolling walker (2 wheels) Transfers: Sit to/from  Stand Sit to Stand: +2 physical assistance, +2 safety/equipment, From elevated surface, Total assist           General transfer comment: with bed in lower position pt unable to come to fully upright with assist from PT and Mobility Specialist. Pt request bed be elevated to position where he can just slide off bed to come to standing. PT refuses and pt becomes upset, swearing at therapist. PT elevates bed slightly and pt provided total Ax2 and stands <10 sec before sitting back on bed surface with no eccentric control        Balance Overall balance assessment: Needs assistance Sitting-balance support: Feet supported Sitting balance-Leahy Scale: Fair     Standing balance support: Bilateral upper extremity supported, During functional activity, Reliant on assistive device for balance Standing balance-Leahy Scale: Zero Standing balance comment: unable to maintain standing long enough to be able to stand                            Communication Communication Communication: No apparent difficulties Factors Affecting Communication: Reduced clarity of speech;Difficulty expressing self (Pt reported that they have difficulties with speech due to damaged voice box and speaked softly unless yelling at therapist)  Cognition Arousal: Alert Behavior During Therapy: WFL for tasks assessed/performed   PT - Cognitive impairments: Safety/Judgement                       PT - Cognition Comments: pt with little to no command follow, swears at therapist when he does not get his way, refuses  to elevate feet to decrease swelling in his feet Following commands: Impaired Following commands impaired: Follows multi-step commands inconsistently (selectively follows commands)    Cueing Cueing Techniques: Verbal cues     General Comments General comments (skin integrity, edema, etc.): Pt verbally abusive when he does not get his way      Pertinent Vitals/Pain Pain Assessment Pain  Assessment: 0-10 Faces Pain Scale: Hurts worst Pain Location: bilateral feet in dependent position due to 4+ pitting edema Pain Descriptors / Indicators: Discomfort, Heaviness, Pressure Pain Intervention(s): Limited activity within patient's tolerance    Home Living Family/patient expects to be discharged to:: Private residence Living Arrangements: Parent Available Help at Discharge: Family;Available 24 hours/day Type of Home: Apartment Home Access: Level entry Entrance Stairs-Rails: Right;Left;Can reach both Entrance Stairs-Number of Steps: level entrance at the back   Home Layout: One level Home Equipment: Wheelchair - Forensic psychologist (2 wheels);BSC/3in1;Hospital bed;Shower seat;Other (comment) Additional Comments: Recieving HH nursing care following return home from SNF rehab. However, recently did not have any aides and reported a decline in particiaption to ADLS. Pt then also requires to get up a hill to be able to enter/exit the home        PT Goals (current goals can now be found in the care plan section) Acute Rehab PT Goals Patient Stated Goal: improve independence PT Goal Formulation: With patient Time For Goal Achievement: 10/26/23 Potential to Achieve Goals: Fair Progress towards PT goals: Not progressing toward goals - comment (refuses to follow commands)    Frequency    Min 2X/week       AM-PAC PT "6 Clicks" Mobility   Outcome Measure  Help needed turning from your back to your side while in a flat bed without using bedrails?: A Little Help needed moving from lying on your back to sitting on the side of a flat bed without using bedrails?: A Little Help needed moving to and from a bed to a chair (including a wheelchair)?: A Lot Help needed standing up from a chair using your arms (e.g., wheelchair or bedside chair)?: Total Help needed to walk in hospital room?: Total Help needed climbing 3-5 steps with a railing? : Total 6 Click Score: 11    End of  Session Equipment Utilized During Treatment: Oxygen Activity Tolerance: Patient limited by fatigue;Other (comment) (and impaired command follow) Patient left: in bed;with call bell/phone within reach (MD ok'd no alarm per pt adament refusal/request) Nurse Communication: Mobility status PT Visit Diagnosis: Unsteadiness on feet (R26.81);Other abnormalities of gait and mobility (R26.89);Muscle weakness (generalized) (M62.81);Difficulty in walking, not elsewhere classified (R26.2);Pain Pain - Right/Left: Right (Bilateral) Pain - part of body: Leg;Ankle and joints of foot     Time: 1610-9604 PT Time Calculation (min) (ACUTE ONLY): 32 min  Charges:    $Therapeutic Activity: 23-37 mins PT General Charges $$ ACUTE PT VISIT: 1 Visit                     Brissia Delisa B. Beverely Risen PT, DPT Acute Rehabilitation Services Please use secure chat or  Call Office [phone]    Elon Alas [facility] 10/19/2023, 4:46 PM

## 2023-10-19 NOTE — Care Management Important Message (Signed)
 Important Message  Patient Details  Name: Phillip Young MRN: [id number] Date of Birth: [birth year]   Important Message Given:  Yes - Medicare IM     Renie Ora 10/19/2023, 10:42 AM

## 2023-10-19 NOTE — Progress Notes (Signed)
 PT Cancellation Note  Patient Details Name: Phillip Young MRN: [id number] DOB: [birth year]   Cancelled Treatment:    Reason Eval/Treat Not Completed: (P) Patient at procedure or test/unavailable Pt is off floor for HD. PT will follow back this afternoon for treatment as able.  Matteson Blue B. Beverely Risen PT, DPT Acute Rehabilitation Services Please use secure chat or  Call Office [phone]    Elon Alas [facility] 10/19/2023, 8:59 AM

## 2023-10-19 NOTE — Progress Notes (Signed)
   10/19/23 1205  Vitals  Temp 98.4 F (36.9 C)  Pulse Rate 89  Resp (!) 27  BP (!) 126/107  SpO2 100 %  O2 Device HFNC  Type of Weight Post-Dialysis  Oxygen Therapy  O2 Flow Rate (L/min) 6 L/min  Patient Activity (if Appropriate) In bed  Pulse Oximetry Type Continuous  Oximetry Probe Site Changed No  Post Treatment  Dialyzer Clearance Lightly streaked  Hemodialysis Intake (mL) 100 mL  Liters Processed 62  Fluid Removed (mL) 2700 mL  Tolerated HD Treatment No (Comment) (Pt cramping, requested to end treatment AMA. Cramping resolved at rinseback-333ml return of blood.)  Oxygen Therapy/Pulse Ox  $ High Flow Nasal Cannula  Yes   Received patient in bed to unit.  Alert and oriented.  Informed consent signed and in chart.   TX duration: Two hours and thirty-five minutes  Patient tolerated well.  Transported back to the room  Alert, without acute distress.  Hand-off given to patient's nurse.   Access used: Right chest HD catheter Access issues: none

## 2023-10-19 NOTE — Progress Notes (Signed)
 HD#9 SUBJECTIVE:  Patient Summary:Phillip Young is a 38 y.o. with a pertinent PMH of chronic hypoxic respiratory failure on 2-4 L home O2, HFrEF, ESRD on dialysis who presents for concerns of shortness of breath and admitted for hypervolemia.   Overnight Events: None   Interim History: Patient is evaluated during HD session, reports doing well. States that his swelling has gone down. Reports that in order for him to go home, he needs to be able to walk which he can do right now and would like serial HD sessions here.   OBJECTIVE:  Vital Signs: Vitals:   10/19/23 1008 10/19/23 1023 10/19/23 1039 10/19/23 1135  BP: 112/75 99/72 122/86   Pulse: 92 (!) 54 (!) 110 (!) 51  Resp: 20 (!) 22 (!) 21 10  Temp:      TempSrc:      SpO2: 100% 100% 100% 100%  Weight:      Height:       Supplemental O2: Nasal Cannula SpO2: 100 % O2 Flow Rate (L/min): 6 L/min  Filed Weights   10/17/23 0800 10/17/23 1205 10/19/23 0852  Weight: (!) 192 kg (!) 188.4 kg (!) 178.1 kg     Intake/Output Summary (Last 24 hours) at 10/19/2023 1406 Last data filed at 10/19/2023 0306 Gross per 24 hour  Intake 360 ml  Output --  Net 360 ml   Net IO Since Admission: -5,800 mL [10/19/23 1406]  Physical Exam: Physical Exam  Patient Lines/Drains/Airways Status     Active Line/Drains/Airways     Name Placement date Placement time Site Days   Peripheral IV 10/10/23 22 G Posterior;Right Forearm 10/10/23  --  Forearm  9   Hemodialysis Catheter Right Internal jugular Double lumen Permanent (Tunneled) 05/30/23  0856  Internal jugular  142   Pressure Injury 10/16/23 Buttocks Right;Lower Stage 2 -  Partial thickness loss of dermis presenting as a shallow open injury with a red, pink wound bed without slough. 10/16/23  1200  -- 3   Wound / Incision (Open or Dehisced) 08/31/23 Other (Comment) Thigh Left;Posterior partial thickness blisters 08/31/23  --  Thigh  49   Wound / Incision (Open or Dehisced) 09/22/23  Non-pressure wound Toe (Comment  which one) Right R great toenail removed 09/22/23  1930  Toe (Comment  which one)  27             ASSESSMENT/PLAN:  Assessment: Principal Problem:   Acute on chronic hypoxic respiratory failure ([name]) Active Problems:   Morbid obesity ([name])   Chronic systolic CHF (congestive heart failure) ([name])   Chronic respiratory failure with hypoxia ([name])   Paroxysmal atrial flutter ([name])   ESRD (end stage renal disease) on dialysis ([name])   Hypotension   ESRD on dialysis ([name])   Hypervolemia   Type 2 diabetes mellitus with diabetic chronic kidney disease ([name])   Chronic a-fib ([name])   Sacral wound  Acute on chronic hypoxic respiratory failure, on home 2-4 L oxygen Pulmonary edema Hx presumed amiodarone toxicity HFrEF, LVEF <20% ESRD on HD  Hyperphosphatemia  HAGMA  Stable, receiving HD session this morning. Reports that he needs to ambulate prior to going home. Nephrology on board. Will need to see if patient requires serial HD sessions here.  - Wean oxygen as tolerated, baseline 3-4L - Continue digoxin 0.125 mg 3 times daily on Monday, Wednesday, Friday - Continue Renvela 1600 mg 3 times daily with meals - Continue Cinacelet 30 mg daily on dialysis days - TOC on board, patient  does not want to go to SNF. Unfortunately, he can not be screen inpatient for electric wheelchair, pending discharge home when nephrologist clear for serial HD sessions    Muscle cramping Reported that it helped during dialysis, but has not been taking.   - Stop Robaxin 500 mg MWF   Hypotension Likely due to above with underlying vasoplegia. Continue home medication midodrine 30 mg TID    Anemia of chronic disease Hgb stable 9.8 (10.5); No signs of blood loss. Continue to monitor - Aranesp 150 mcg every friday   Sacral Decubitus Ulcer Indurated abscess  Hx of hidradenitis suppurativa - Apply warm compresses around the induration site to facilitate drainage - Clindamycin  topical gel for sacrum   Paroxysmal atrial fibrillation - Telemetry monitoring - Continue home Eliquis 5mg  BID, Digoxin 0.125mg  MWF   Hyponatremia Na 131, likely due to hypervolemia and underlying ESRD  - Trend    Chronic somatic and neuropathic pain Chronic opiate therapy It has been attempted to switch away from chronic oxycodone therapy particularly with history of ESRD, however patient reports dilaudid has been less effective and requested continued oxycodone.  - Continue venlafaxine 37.5mg  daily, lyrica 50mg  daily, oxycodone 10mg  q6h prn.  - Continue Tylenol 1000 mg every 6 hours for pain control - Continue PT/OT   Hypothyroidism Continue home levothyroxine   Depression Continue home venlafaxine 37.5mg   Best Practice: Diet: Renal diet IVF: None VTE: Eliquis 5 mg BID Code: Full AB: none Wounds: as above  Therapy Recs: SNF Family Contact: mother  DISPO: Anticipated discharge  2-3 days  to Home pending  HD sessions .  Signature: Jeral Pinch, D.O.  Internal Medicine Resident, PGY-1 [facility]  Pager: [phone] 2:06 PM, 10/19/2023   Please contact the on call pager after 5 pm and on weekends at [phone].

## 2023-10-20 LAB — RENAL FUNCTION PANEL
Albumin: 2.8 g/dL — ABNORMAL LOW (ref 3.5–5.0)
Anion gap: 15 (ref 5–15)
BUN: 54 mg/dL — ABNORMAL HIGH (ref 6–20)
CO2: 22 mmol/L (ref 22–32)
Calcium: 9.2 mg/dL (ref 8.9–10.3)
Chloride: 98 mmol/L (ref 98–111)
Creatinine, Ser: 7.99 mg/dL — ABNORMAL HIGH (ref 0.61–1.24)
GFR, Estimated: 8 mL/min — ABNORMAL LOW (ref >60)
Glucose, Bld: 95 mg/dL (ref 70–99)
Phosphorus: 6.4 mg/dL — ABNORMAL HIGH (ref 2.5–4.6)
Potassium: 4.3 mmol/L (ref 3.5–5.1)
Sodium: 135 mmol/L (ref 135–145)

## 2023-10-20 LAB — CBC
HCT: 31 % — ABNORMAL LOW (ref 39.0–52.0)
Hemoglobin: 10.1 g/dL — ABNORMAL LOW (ref 13.0–17.0)
MCH: 34 pg (ref 26.0–34.0)
MCHC: 32.6 g/dL (ref 30.0–36.0)
MCV: 104.4 fL — ABNORMAL HIGH (ref 80.0–100.0)
Platelets: 187 10*3/uL (ref 150–400)
RBC: 2.97 MIL/uL — ABNORMAL LOW (ref 4.22–5.81)
RDW: 17.3 % — ABNORMAL HIGH (ref 11.5–15.5)
WBC: 6.2 10*3/uL (ref 4.0–10.5)
nRBC: 0 % (ref 0.0–0.2)

## 2023-10-20 MED ORDER — CHLORHEXIDINE GLUCONATE CLOTH 2 % EX PADS
6.0000 | MEDICATED_PAD | Freq: Every day | CUTANEOUS | Status: DC
  Administered 2023-10-20 – 2023-10-21 (×2): 6 via TOPICAL

## 2023-10-20 NOTE — Progress Notes (Signed)
 Occupational Therapy Treatment Patient Details Name: Phillip Young MRN: [id number] DOB: June 01, 1986 Today's Date: 10/20/2023   History of present illness Patient is 38 year old male who presented 10/10/23 with edema, dyspnea, weight gain and missed HD.  PMH: morbid obesity, HTN, prediabetes, OSA on home BiPAP, chronic respirator failure on 2-4L home oxygen, chronic systolic heart failure due to NICM, ESRD, and PAF.   OT comments  Pt continues to be limited by pain in BLEs, deferred attempt to stand with stedy today but open to EOB exercises. Pt continued to report pain in bottom of BLEs  when returning them back to the ground while performing exercises but was still tolerable to exercises. Discussed use of gentle tactile input for pain desensitization by having pt apply lotion to BLEs. HR remains elevated with continued activity despite rest breaks between exercise sets. OT to continue to progress pt as able, DC plans remain appropriate for AIR.       If plan is discharge home, recommend the following:  Two people to help with walking and/or transfers;Two people to help with bathing/dressing/bathroom;Assistance with cooking/housework;Assist for transportation;Help with stairs or ramp for entrance   Equipment Recommendations  Other (comment) (TBD)    Recommendations for Other Services      Precautions / Restrictions Precautions Precautions: Fall Recall of Precautions/Restrictions: Impaired Precaution/Restrictions Comments: watch o2/HR Restrictions Weight Bearing Restrictions Per Provider Order: No       Mobility Bed Mobility Overal bed mobility: Needs Assistance Bed Mobility: Supine to Sit     Supine to sit: Contact guard, Used rails, HOB elevated          Transfers                   General transfer comment: Declined, reports his Bilat LEs are in too much pain     Balance Overall balance assessment: Needs assistance Sitting-balance support: Feet supported Sitting  balance-Leahy Scale: Fair                                     ADL either performed or assessed with clinical judgement   ADL       Grooming: Sitting;Set up Grooming Details (indicate cue type and reason): Pt applying lotion to BLEs                               General ADL Comments: pt declined stand efforts secondary to pain in BLEs with pressure. Receptive to EOB exercises    Extremity/Trunk Assessment              Vision       Perception     Praxis     Communication Communication Communication: No apparent difficulties   Cognition Arousal: Alert Behavior During Therapy: WFL for tasks assessed/performed                                 Following commands: Intact        Cueing   Cueing Techniques: Verbal cues  Exercises General Exercises - Lower Extremity Long Arc Quad: AROM, Both, Seated, 20 reps, Strengthening (2 sets of 10) Hip ABduction/ADduction: AROM, Both, 20 reps, Seated, Strengthening (2 sets of 10) Hip Flexion/Marching: AROM, Strengthening, Seated, Both, 20 reps (2 sets of 10)    Shoulder Instructions  General Comments HR in continuous afib throughout session, up to 156 bpm max with sup>sit transition and up to 146 bpm max with seated exercises despite frequent rest breaks. Sp02 >95% on 7L    Pertinent Vitals/ Pain       Pain Assessment Pain Assessment: 0-10 Pain Score: 10-Worst pain ever Pain Location: bilat feet with any WB Pain Descriptors / Indicators: Discomfort, Heaviness, Pressure Pain Intervention(s): Limited activity within patient's tolerance, Monitored during session, Repositioned  Home Living                                          Prior Functioning/Environment              Frequency  Min 2X/week        Progress Toward Goals  OT Goals(current goals can now be found in the care plan section)  Progress towards OT goals: Progressing toward  goals  Acute Rehab OT Goals Patient Stated Goal: to be independent OT Goal Formulation: With patient Time For Goal Achievement: 10/26/23 Potential to Achieve Goals: Good  Plan      Co-evaluation                 AM-PAC OT "6 Clicks" Daily Activity     Outcome Measure   Help from another person eating meals?: None Help from another person taking care of personal grooming?: A Little Help from another person toileting, which includes using toliet, bedpan, or urinal?: A Lot Help from another person bathing (including washing, rinsing, drying)?: A Lot Help from another person to put on and taking off regular upper body clothing?: A Little Help from another person to put on and taking off regular lower body clothing?: A Lot 6 Click Score: 16    End of Session    OT Visit Diagnosis: Unsteadiness on feet (R26.81);Other abnormalities of gait and mobility (R26.89);Muscle weakness (generalized) (M62.81);Pain Pain - Right/Left:  (BLEs)   Activity Tolerance Patient limited by pain   Patient Left in bed;with call bell/phone within reach;Other (comment) (sitting EOB eating leftover dinner)   Nurse Communication Mobility status        Time: [phone] OT Time Calculation (min): 22 min  Charges: OT General Charges $OT Visit: 1 Visit OT Treatments $Therapeutic Exercise: 8-22 mins  10/20/2023  AB, OTR/L  Acute Rehabilitation Services  Office: [phone]   Tristan Schroeder 10/20/2023, 6:36 PM

## 2023-10-20 NOTE — Progress Notes (Addendum)
 [facility] Progress Note   Subjective:    Seen and examined patient at bedside. Noted he didn't reach max UF yesterday 2nd cramping. He signed off early. Next HD 3/28.  Objective Vitals:   10/19/23 2205 10/20/23 0349 10/20/23 0500 10/20/23 0830  BP: (!) 128/99 111/87  104/79  Pulse: 83 92  (!) 106  Resp: 20 17  (!) 26  Temp:  98.5 F (36.9 C)  (!) 97.4 F (36.3 C)  TempSrc:  Oral  Axillary  SpO2: 98% 100%    Weight:   (!) 183.9 kg   Height:       Physical Exam Gen alert, no distress, nasal oxygen 6L Chest mild rhonchi anteriorly, no rales/ wheezing Tachy intermittently Abd soft ntnd no mass or ascites  Ext 1-2+ ABD and BL LEs Neuro is alert, Ox 3 , nf TDC intact  Filed Weights   10/17/23 1205 10/19/23 0852 10/20/23 0500  Weight: (!) 188.4 kg (!) 178.1 kg (!) 183.9 kg    Intake/Output Summary (Last 24 hours) at 10/20/2023 0924 Last data filed at 10/20/2023 0000 Gross per 24 hour  Intake 363.8 ml  Output 2700 ml  Net -2336.2 ml    Additional Objective Labs: Basic Metabolic Panel: Recent Labs  Lab 10/18/23 0430 10/19/23 0507 10/20/23 0506  NA 131* 131* 135  K 4.3 4.5 4.3  CL 94* 97* 98  CO2 22 20* 22  GLUCOSE 95 108* 95  BUN 50* 64* 54*  CREATININE 8.00* 9.39* 7.99*  CALCIUM 9.3 9.0 9.2  PHOS 7.1* 7.3* 6.4*   Liver Function Tests: Recent Labs  Lab 10/18/23 0430 10/19/23 0507 10/20/23 0506  ALBUMIN 2.9* 2.9* 2.8*   No results for input(s): "LIPASE", "AMYLASE" in the last 168 hours. CBC: Recent Labs  Lab 10/15/23 0358 10/17/23 0755 10/18/23 0430 10/19/23 0507 10/20/23 0506  WBC 7.7 7.9 7.1 7.3 6.2  HGB 10.6* 10.5* 10.5* 9.8* 10.1*  HCT 32.9* 32.5* 32.7* 29.8* 31.0*  MCV 103.5* 105.5* 104.1* 102.8* 104.4*  PLT 200 207 180 186 187   Blood Culture    Component Value Date/Time   SDES BLOOD BLOOD RIGHT HAND 05/27/2023 0959   SPECREQUEST  05/27/2023 0959    BOTTLES DRAWN AEROBIC AND ANAEROBIC Blood Culture adequate volume    CULT  05/27/2023 0959    NO GROWTH 5 DAYS Performed at [facility], [address] [place], [place] [address]    REPTSTATUS 06/01/2023 FINAL 05/27/2023 6045    Cardiac Enzymes: No results for input(s): "CKTOTAL", "CKMB", "CKMBINDEX", "TROPONINI" in the last 168 hours. CBG: No results for input(s): "GLUCAP" in the last 168 hours. Iron Studies: No results for input(s): "IRON", "TIBC", "TRANSFERRIN", "FERRITIN" in the last 72 hours. Lab Results  Component Value Date   INR 2.0 (H) 06/10/2022   INR 2.8 (H) 06/09/2022   INR 2.9 (H) 06/07/2022   Studies/Results: No results found.  Medications:  anticoagulant sodium citrate      acetaminophen  1,000 mg Oral Q6H   apixaban  5 mg Oral BID   Chlorhexidine Gluconate Cloth  6 each Topical Q0600   clindamycin   Topical BID   darbepoetin (ARANESP) injection - DIALYSIS  150 mcg Subcutaneous Q Fri-1800   digoxin  0.125 mg Oral Once per day on Monday Wednesday Friday   hydrocerin   Topical Daily   levothyroxine  25 mcg Oral Q0600   midodrine  30 mg Oral TID WC   pregabalin  50 mg Oral Daily  sevelamer carbonate  1,600 mg Oral TID WC   venlafaxine XR  37.5 mg Oral Daily    Dialysis Orders: MWF [facility] Needs updating  4.5H B400    155.5 kg   2.0 bath TDC   No heparin (history of HIT) -Patient does not accept blood products -Micera Q2wks - last dose 09/30/23   Renal-related home meds: Sensipar 30 mg MWF Midodrine 30 mg 3 times daily Renvela 1600 mg 3 times daily AC Others: Venlafaxine, Lyrica, oxycodone IR, Synthroid, digoxin, Eliquis, Narcan as needed, Zepbound   Assessment/Plan: Acute on chronic hypoxic respiratory failure: With pulmonary edema on chest x-ray. Lower extremity edema is coming down slowly. Continue to lower volume with HD as able.  ESRD - on HD MWF.  Received HD 3/18, 3/20, 3/21. Challenging to do serial HD d/t high inpatient census for dialysis. Next HD 3/28. Will consider extra HD for Saturday 3/29. BP  -chronic hypotension on high-dose midodrine at home.  Continue. Volume -Chronic volume overload.  Manage volume with HD. He signed off early yesterday d/t cramping. Push UF as tolerated. Anemia of esrd -hemoglobin 10-11 here, follow. Aranesp 200 ordered for 3/21.  Secondary hyperparathyroidism -calcium in range, Phos is high, noted he's missed some doses. Continue binders with meals and ongoing compliance. Chronic pain syndrome Atrial fibrillation - on Eliquis, digoxin Neuropathy - per primary  Dispo - pending   Salome Holmes, NP [facility] 10/20/2023,9:24 AM  LOS: 10 days

## 2023-10-20 NOTE — Progress Notes (Signed)
 HD#10 Subjective:   Summary: :Phillip Young is a 38 y.o. with a pertinent PMH of chronic hypoxic respiratory failure on 2-4 L home O2, HFrEF, ESRD on dialysis who presents for concerns of shortness of breath and admitted for hypervolemia.   Overnight Events: None  Patient is evaluated at bedside, reports overall stable. States swelling of his extremities and lower abdomen.  Objective:  Vital signs in last 24 hours: Vitals:   10/20/23 0500 10/20/23 0830 10/20/23 0932 10/20/23 1131  BP:  104/79 95/82 101/81  Pulse:  (!) 106 (!) 107 (!) 111  Resp:  (!) 26 20 18   Temp:  (!) 97.4 F (36.3 C) 97.9 F (36.6 C) 98.5 F (36.9 C)  TempSrc:  Axillary Oral Oral  SpO2:      Weight: (!) 183.9 kg     Height:       Supplemental O2: Nasal Cannula SpO2: 100 % O2 Flow Rate (L/min): 6 L/min   Physical Exam:   HENT: normocephalic atraumatic, mucous membranes moist Eyes: conjunctiva non-erythematous Neck: supple Cardiovascular: regular rate and rhythm, lower extremity swelling present Pulmonary/Chest: normal work of breathing on room air, lungs clear to auscultation bilaterally Abdominal: soft, non-tender, non-distended MSK: Obese  Neurological: Awake, alert, answering questions appropriately Skin: warm and dry  Filed Weights   10/17/23 1205 10/19/23 0852 10/20/23 0500  Weight: (!) 188.4 kg (!) 178.1 kg (!) 183.9 kg     Intake/Output Summary (Last 24 hours) at 10/20/2023 1227 Last data filed at 10/20/2023 0954 Gross per 24 hour  Intake 843.8 ml  Output 50 ml  Net 793.8 ml   Net IO Since Admission: -7,706.2 mL [10/20/23 1227]  Pertinent Labs:    Latest Ref Rng & Units 10/20/2023    5:06 AM 10/19/2023    5:07 AM 10/18/2023    4:30 AM  CBC  WBC 4.0 - 10.5 K/uL 6.2  7.3  7.1   Hemoglobin 13.0 - 17.0 g/dL 40.9  9.8  81.1   Hematocrit 39.0 - 52.0 % 31.0  29.8  32.7   Platelets 150 - 400 K/uL 187  186  180        Latest Ref Rng & Units 10/20/2023    5:06 AM 10/19/2023     5:07 AM 10/18/2023    4:30 AM  CMP  Glucose 70 - 99 mg/dL 95  914  95   BUN 6 - 20 mg/dL 54  64  50   Creatinine 0.61 - 1.24 mg/dL 7.82  9.56  2.13   Sodium 135 - 145 mmol/L 135  131  131   Potassium 3.5 - 5.1 mmol/L 4.3  4.5  4.3   Chloride 98 - 111 mmol/L 98  97  94   CO2 22 - 32 mmol/L 22  20  22    Calcium 8.9 - 10.3 mg/dL 9.2  9.0  9.3     Imaging: No results found.  Assessment/Plan:   Principal Problem:   Acute on chronic hypoxic respiratory failure ([name]) Active Problems:   Morbid obesity ([name])   Chronic systolic CHF (congestive heart failure) ([name])   Chronic respiratory failure with hypoxia ([name])   Paroxysmal atrial flutter ([name])   ESRD (end stage renal disease) on dialysis ([name])   Hypotension   ESRD on dialysis ([name])   Hypervolemia   Type 2 diabetes mellitus with diabetic chronic kidney disease ([name])   Chronic a-fib ([name])   Sacral wound  Acute on chronic hypoxic respiratory failure, on home 2-4 L oxygen  Pulmonary edema Hx presumed amiodarone toxicity HFrEF, LVEF <20% ESRD on HD  Hyperphosphatemia  HAGMA  Stable, Received HD yesterday. Nephrology is following. Plan for serial dialysis tomorrow and possible Saturday.  - Wean oxygen as tolerated, baseline 3-4L - Continue digoxin 0.125 mg 3 times daily on Monday, Wednesday, Friday - Continue Renvela 1600 mg 3 times daily with meals - Continue Cinacelet 30 mg daily on dialysis days - TOC on board, patient does not want to go to SNF. Unfortunately, he can not be screen inpatient for electric wheelchair, pending discharge home when nephrologist clear form serial HD sessions    Hypotension Likely due to above with underlying vasoplegia. Continue home medication midodrine 30 mg TID    Anemia of chronic disease Hgb stable 10.1; No signs of blood loss. Continue to monitor - Aranesp 150 mcg every friday   Sacral Decubitus Ulcer Indurated abscess  Hx of hidradenitis suppurativa - Apply warm compresses around the  induration site to facilitate drainage - Clindamycin topical gel for sacrum   Paroxysmal atrial fibrillation - Telemetry monitoring - Continue home Eliquis 5mg  BID, Digoxin 0.125mg  MWF   Chronic somatic and neuropathic pain Chronic opiate therapy It has been attempted to switch away from chronic oxycodone therapy particularly with history of ESRD, however patient reports dilaudid has been less effective and requested continued oxycodone.  - Continue venlafaxine 37.5mg  daily, lyrica 50mg  daily, oxycodone 10mg  q6h prn.  - Continue Tylenol 1000 mg every 6 hours for pain control - Continue PT/OT   Hypothyroidism Continue home levothyroxine   Depression Continue home venlafaxine 37.5mg   Muscle cramping Reported that it helped during dialysis, but has not been taking.   - Stop Robaxin 500 mg MWF  Resolved:  - Hyponatremia   Diet: Renal IVF: None VTE:  Eliquis BID Wounds: As above  Code: Full PT/OT recs: SNF TOC recs: PTAR Family Update: Mother    Dispo: Anticipated discharge to Home in 3 days pending Serial HDs.   Signature: Jeral Pinch, D.O.  Internal Medicine Resident, PGY-1 [facility]  Pager: [phone] 12:27 PM, 10/20/2023   Please contact the on call pager after 5 pm and on weekends at [phone].

## 2023-10-21 LAB — RENAL FUNCTION PANEL
Albumin: 2.9 g/dL — ABNORMAL LOW (ref 3.5–5.0)
Anion gap: 15 (ref 5–15)
BUN: 65 mg/dL — ABNORMAL HIGH (ref 6–20)
CO2: 22 mmol/L (ref 22–32)
Calcium: 9.6 mg/dL (ref 8.9–10.3)
Chloride: 98 mmol/L (ref 98–111)
Creatinine, Ser: 8.64 mg/dL — ABNORMAL HIGH (ref 0.61–1.24)
GFR, Estimated: 7 mL/min — ABNORMAL LOW (ref >60)
Glucose, Bld: 98 mg/dL (ref 70–99)
Phosphorus: 6.4 mg/dL — ABNORMAL HIGH (ref 2.5–4.6)
Potassium: 4.8 mmol/L (ref 3.5–5.1)
Sodium: 135 mmol/L (ref 135–145)

## 2023-10-21 LAB — CBC
HCT: 30.9 % — ABNORMAL LOW (ref 39.0–52.0)
Hemoglobin: 9.9 g/dL — ABNORMAL LOW (ref 13.0–17.0)
MCH: 33.6 pg (ref 26.0–34.0)
MCHC: 32 g/dL (ref 30.0–36.0)
MCV: 104.7 fL — ABNORMAL HIGH (ref 80.0–100.0)
Platelets: 198 10*3/uL (ref 150–400)
RBC: 2.95 MIL/uL — ABNORMAL LOW (ref 4.22–5.81)
RDW: 17.2 % — ABNORMAL HIGH (ref 11.5–15.5)
WBC: 7.5 10*3/uL (ref 4.0–10.5)
nRBC: 0 % (ref 0.0–0.2)

## 2023-10-21 MED ORDER — OXYBUTYNIN CHLORIDE 5 MG PO TABS
5.0000 mg | ORAL_TABLET | Freq: Once | ORAL | Status: AC
  Administered 2023-10-21: 5 mg via ORAL
  Filled 2023-10-21: qty 1

## 2023-10-21 MED ORDER — METHOCARBAMOL 500 MG PO TABS
500.0000 mg | ORAL_TABLET | Freq: Once | ORAL | Status: AC
  Administered 2023-10-21: 500 mg via ORAL
  Filled 2023-10-21: qty 1

## 2023-10-21 NOTE — Progress Notes (Signed)
   10/21/23 1240  Vitals  Temp 98.8 F (37.1 C)  Pulse Rate 66  Resp (!) 21  BP (!) 141/83  SpO2 100 %  O2 Device HFNC  Weight (!) 185.5 kg  Type of Weight Post-Dialysis  Oxygen Therapy  O2 Flow Rate (L/min) 6 L/min  Patient Activity (if Appropriate) In bed  Pulse Oximetry Type Continuous  Oximetry Probe Site Changed No  Post Treatment  Dialyzer Clearance Lightly streaked  Hemodialysis Intake (mL) 100 mL  Liters Processed 75.6  Fluid Removed (mL) 2900 mL  Tolerated HD Treatment Yes  Post-Hemodialysis Comments Pt requested to end treatment 51 minutes early due to cramping.  Oxygen Therapy/Pulse Ox  $ High Flow Nasal Cannula  Yes   Received patient in bed to unit.  Alert and oriented.  Informed consent signed and in chart.   TX duration: Three hours and nine minutes  Patient tolerated well.  Transported back to the room  Alert, without acute distress.  Hand-off given to patient's nurse.   Access used: Right upper chest HD catheter Access issues: None

## 2023-10-21 NOTE — Plan of Care (Signed)

## 2023-10-21 NOTE — Progress Notes (Signed)
 HD#11 Subjective:   Summary: Phillip Young is a 38 y.o. with a pertinent PMH of chronic hypoxic respiratory failure on 2-4 L home O2, HFrEF, ESRD on dialysis who presents for concerns of shortness of breath and admitted for hypervolemia.   Overnight Events: None  Interim history: Patient is evaluated as he is receiving HD. States that he is in pain. Reports that he wants to talk to palliative care and wants to be comfortable.   Objective:  Vital signs in last 24 hours: Vitals:   10/21/23 0843 10/21/23 0901 10/21/23 0916 10/21/23 0946  BP: (!) 135/95 95/78 109/67 97/66  Pulse: (!) 102 93 97 99  Resp: (!) 21 19 19 19   Temp: 98.2 F (36.8 C)     TempSrc:      SpO2: 100% 100% 100% 100%  Weight: (!) 188.4 kg     Height:       Supplemental O2: Nasal Cannula SpO2: 100 % O2 Flow Rate (L/min): 6 L/min   Physical Exam:  Constitutional: laying in bed  Cardiovascular: regular rate and rhythm Pulmonary/Chest: normal work of breathing on room air, lungs clear to auscultation bilaterally Abdominal: soft, non-tender, non-distended MSK: normal bulk and tone Neurological: Awake, Alert, answering questions  Skin: warm and dry  Filed Weights   10/20/23 0500 10/21/23 0500 10/21/23 0843  Weight: (!) 183.9 kg (!) 186.2 kg (!) 188.4 kg     Intake/Output Summary (Last 24 hours) at 10/21/2023 1113 Last data filed at 10/21/2023 0800 Gross per 24 hour  Intake 1431 ml  Output 0 ml  Net 1431 ml   Net IO Since Admission: -6,275.2 mL [10/21/23 1113]  Pertinent Labs:    Latest Ref Rng & Units 10/21/2023    4:27 AM 10/20/2023    5:06 AM 10/19/2023    5:07 AM  CBC  WBC 4.0 - 10.5 K/uL 7.5  6.2  7.3   Hemoglobin 13.0 - 17.0 g/dL 9.9  16.1  9.8   Hematocrit 39.0 - 52.0 % 30.9  31.0  29.8   Platelets 150 - 400 K/uL 198  187  186        Latest Ref Rng & Units 10/21/2023    4:27 AM 10/20/2023    5:06 AM 10/19/2023    5:07 AM  CMP  Glucose 70 - 99 mg/dL 98  95  096   BUN 6 - 20 mg/dL 65   54  64   Creatinine 0.61 - 1.24 mg/dL 0.45  4.09  8.11   Sodium 135 - 145 mmol/L 135  135  131   Potassium 3.5 - 5.1 mmol/L 4.8  4.3  4.5   Chloride 98 - 111 mmol/L 98  98  97   CO2 22 - 32 mmol/L 22  22  20    Calcium 8.9 - 10.3 mg/dL 9.6  9.2  9.0     Imaging: No results found.  Assessment/Plan:   Principal Problem:   Acute on chronic hypoxic respiratory failure ([name]) Active Problems:   Morbid obesity ([name])   Chronic systolic CHF (congestive heart failure) ([name])   Chronic respiratory failure with hypoxia ([name])   Paroxysmal atrial flutter ([name])   ESRD (end stage renal disease) on dialysis ([name])   Hypotension   ESRD on dialysis ([name])   Hypervolemia   Type 2 diabetes mellitus with diabetic chronic kidney disease ([name])   Chronic a-fib ([name])   Sacral wound  Acute on chronic hypoxic respiratory failure, on home 2-4 L oxygen Pulmonary edema  Hx presumed amiodarone toxicity HFrEF, LVEF <20% ESRD on HD  Hyperphosphatemia  HAGMA  Nephrology on board, receiving HD, reports that he lower extremity tenderness. States that he wants to talk to palliative care and wants to be comfortable.   - Palliative care consulted  - Continue HD sessions per nephro  - Wean oxygen as tolerated, baseline 3-4L - Continue digoxin 0.125 mg 3 times daily on Monday, Wednesday, Friday - Continue Renvela 1600 mg 3 times daily with meals - Continue Cinacelet 30 mg daily on dialysis days   Hypotension Likely due to above with underlying vasoplegia. Continue home medication midodrine 30 mg TID    Anemia of chronic disease Hgb stable 9.9; No signs of blood loss. Continue to monitor - Aranesp 150 mcg every friday   Sacral Decubitus Ulcer Indurated abscess  Hx of hidradenitis suppurativa - Apply warm compresses around the induration site to facilitate drainage - Clindamycin topical gel for sacrum   Paroxysmal atrial fibrillation - Telemetry monitoring - Continue home Eliquis 5mg  BID, Digoxin 0.125mg   MWF   Chronic somatic and neuropathic pain Chronic opiate therapy It has been attempted to switch away from chronic oxycodone therapy particularly with history of ESRD, however patient reports dilaudid has been less effective and requested continued oxycodone.  - Continue venlafaxine 37.5mg  daily, lyrica 50mg  daily, oxycodone 10mg  q6h prn.  - Continue Tylenol 1000 mg every 6 hours for pain control - Continue PT/OT   Hypothyroidism Continue home levothyroxine   Depression Continue home venlafaxine 37.5mg    Muscle cramping Reported that it helped during dialysis, but has not been taking.   - Stop Robaxin 500 mg MWF   Resolved:  - Hyponatremia   Diet: Renal IVF: None  VTE:  Eliquis BID Wounds: As above  Code: Full PT/OT recs: SNF TOC recs: PTAR Family Update: Mother      Dispo: Anticipated discharge to Home in 3 days pending Serial HDs.   Signature: Jeral Pinch, D.O.  Internal Medicine Resident, PGY-1 [facility]  Pager: [phone] 11:13 AM, 10/21/2023   Please contact the on call pager after 5 pm and on weekends at [phone].

## 2023-10-21 NOTE — Procedures (Signed)
 I was present at this dialysis session. I have reviewed the session itself and made appropriate changes.  Will consult palliative care to help with pain management.   Vital signs in last 24 hours:  Temp:  [97.1 F (36.2 C)-98.6 F (37 C)] 97.4 F (36.3 C) (03/28 0814) Pulse Rate:  [60-113] 113 (03/28 0814) Resp:  [17-22] 19 (03/28 0814) BP: (89-111)/(76-95) 89/76 (03/28 0814) SpO2:  [96 %-100 %] 100 % (03/28 0814) Weight:  [186.2 kg] 186.2 kg (03/28 0500) Weight change: 8.1 kg Filed Weights   10/19/23 0852 10/20/23 0500 10/21/23 0500  Weight: (!) 178.1 kg (!) 183.9 kg (!) 186.2 kg    Recent Labs  Lab 10/21/23 0427  NA 135  K 4.8  CL 98  CO2 22  GLUCOSE 98  BUN 65*  CREATININE 8.64*  CALCIUM 9.6  PHOS 6.4*    Recent Labs  Lab 10/19/23 0507 10/20/23 0506 10/21/23 0427  WBC 7.3 6.2 7.5  HGB 9.8* 10.1* 9.9*  HCT 29.8* 31.0* 30.9*  MCV 102.8* 104.4* 104.7*  PLT 186 187 198    Scheduled Meds:  acetaminophen  1,000 mg Oral Q6H   apixaban  5 mg Oral BID   Chlorhexidine Gluconate Cloth  6 each Topical Q0600   clindamycin   Topical BID   darbepoetin (ARANESP) injection - DIALYSIS  150 mcg Subcutaneous Q Fri-1800   digoxin  0.125 mg Oral Once per day on Monday Wednesday Friday   hydrocerin   Topical Daily   levothyroxine  25 mcg Oral Q0600   midodrine  30 mg Oral TID WC   pregabalin  50 mg Oral Daily   sevelamer carbonate  1,600 mg Oral TID WC   venlafaxine XR  37.5 mg Oral Daily   Continuous Infusions:  anticoagulant sodium citrate     PRN Meds:.cinacalcet, oxyCODONE   Irena Cords,  MD 10/21/2023, 9:17 AM

## 2023-10-22 ENCOUNTER — Other Ambulatory Visit (HOSPITAL_COMMUNITY): Payer: Self-pay

## 2023-10-22 LAB — CBC
HCT: 31.7 % — ABNORMAL LOW (ref 39.0–52.0)
Hemoglobin: 10.3 g/dL — ABNORMAL LOW (ref 13.0–17.0)
MCH: 33.6 pg (ref 26.0–34.0)
MCHC: 32.5 g/dL (ref 30.0–36.0)
MCV: 103.3 fL — ABNORMAL HIGH (ref 80.0–100.0)
Platelets: 208 10*3/uL (ref 150–400)
RBC: 3.07 MIL/uL — ABNORMAL LOW (ref 4.22–5.81)
RDW: 17.1 % — ABNORMAL HIGH (ref 11.5–15.5)
WBC: 6.7 10*3/uL (ref 4.0–10.5)
nRBC: 0 % (ref 0.0–0.2)

## 2023-10-22 LAB — RENAL FUNCTION PANEL
Albumin: 3 g/dL — ABNORMAL LOW (ref 3.5–5.0)
Anion gap: 17 — ABNORMAL HIGH (ref 5–15)
BUN: 51 mg/dL — ABNORMAL HIGH (ref 6–20)
CO2: 21 mmol/L — ABNORMAL LOW (ref 22–32)
Calcium: 9.7 mg/dL (ref 8.9–10.3)
Chloride: 97 mmol/L — ABNORMAL LOW (ref 98–111)
Creatinine, Ser: 7.31 mg/dL — ABNORMAL HIGH (ref 0.61–1.24)
GFR, Estimated: 9 mL/min — ABNORMAL LOW (ref >60)
Glucose, Bld: 86 mg/dL (ref 70–99)
Phosphorus: 5.7 mg/dL — ABNORMAL HIGH (ref 2.5–4.6)
Potassium: 4.4 mmol/L (ref 3.5–5.1)
Sodium: 135 mmol/L (ref 135–145)

## 2023-10-22 MED ORDER — HYDROCERIN EX CREA
1.0000 | TOPICAL_CREAM | Freq: Every day | CUTANEOUS | 0 refills | Status: DC
  Filled 2023-10-22: qty 120, 30d supply, fill #0

## 2023-10-22 NOTE — Progress Notes (Signed)
 [facility] Progress Note   Subjective: Seen in room. Sleeping says breathing is "choppy" but O2 sats 100% on 6L/M. Truncated treatment yesterday. Net UF 2.9.   Objective Vitals:   10/21/23 2015 10/21/23 2017 10/21/23 2352 10/22/23 0623  BP: (!) 128/95  124/84 (!) 92/44  Pulse:   95 97  Resp: (!) 27 17 18 20   Temp: 98.7 F (37.1 C)   (!) 97.4 F (36.3 C)  TempSrc: Oral  Axillary Oral  SpO2:   96% 96%  Weight:    121.4 kg  Height:       Physical Exam General: Obese male in NAD Heart S1,S2 irreg irreg Afib on monitor rate 90s Lungs: Decreased in bases otherwise CTAB. No SOB Abdomen:obese NABS Extremities: 1+ BLE edema Dialysis Access: RIJ TDC drsg intact    Additional Objective Labs: Basic Metabolic Panel: Recent Labs  Lab 10/20/23 0506 10/21/23 0427 10/22/23 0249  NA 135 135 135  K 4.3 4.8 4.4  CL 98 98 97*  CO2 22 22 21*  GLUCOSE 95 98 86  BUN 54* 65* 51*  CREATININE 7.99* 8.64* 7.31*  CALCIUM 9.2 9.6 9.7  PHOS 6.4* 6.4* 5.7*   Liver Function Tests: Recent Labs  Lab 10/20/23 0506 10/21/23 0427 10/22/23 0249  ALBUMIN 2.8* 2.9* 3.0*   No results for input(s): "LIPASE", "AMYLASE" in the last 168 hours. CBC: Recent Labs  Lab 10/18/23 0430 10/19/23 0507 10/20/23 0506 10/21/23 0427 10/22/23 0249  WBC 7.1 7.3 6.2 7.5 6.7  HGB 10.5* 9.8* 10.1* 9.9* 10.3*  HCT 32.7* 29.8* 31.0* 30.9* 31.7*  MCV 104.1* 102.8* 104.4* 104.7* 103.3*  PLT 180 186 187 198 208   Blood Culture    Component Value Date/Time   SDES BLOOD BLOOD RIGHT HAND 05/27/2023 0959   SPECREQUEST  05/27/2023 0959    BOTTLES DRAWN AEROBIC AND ANAEROBIC Blood Culture adequate volume   CULT  05/27/2023 0959    NO GROWTH 5 DAYS Performed at [facility], [address], [place], [place] [address]    REPTSTATUS 06/01/2023 FINAL 05/27/2023 9562    Cardiac Enzymes: No results for input(s): "CKTOTAL", "CKMB", "CKMBINDEX", "TROPONINI" in the last 168 hours. CBG: No  results for input(s): "GLUCAP" in the last 168 hours. Iron Studies: No results for input(s): "IRON", "TIBC", "TRANSFERRIN", "FERRITIN" in the last 72 hours. @lablastinr3 @ Studies/Results: No results found. Medications:  anticoagulant sodium citrate      acetaminophen  1,000 mg Oral Q6H   apixaban  5 mg Oral BID   Chlorhexidine Gluconate Cloth  6 each Topical Q0600   clindamycin   Topical BID   darbepoetin (ARANESP) injection - DIALYSIS  150 mcg Subcutaneous Q Fri-1800   digoxin  0.125 mg Oral Once per day on Monday Wednesday Friday   hydrocerin   Topical Daily   levothyroxine  25 mcg Oral Q0600   midodrine  30 mg Oral TID WC   pregabalin  50 mg Oral Daily   sevelamer carbonate  1,600 mg Oral TID WC   venlafaxine XR  37.5 mg Oral Daily     Dialysis Orders: MWF [facility] Needs updating  4.5H B400    155.5 kg   2.0 bath TDC   No heparin (history of HIT) -Patient does not accept blood products -Micera Q2wks - last dose 09/30/23   Renal-related home meds: Sensipar 30 mg MWF Midodrine 30 mg 3 times daily Renvela 1600 mg 3 times daily AC Others: Venlafaxine, Lyrica, oxycodone IR, Synthroid, digoxin, Eliquis, Narcan  as needed, Zepbound   Assessment/Plan: Acute on chronic hypoxic respiratory failure: With pulmonary edema on chest x-ray. Lower extremity edema is coming down slowly. Continue to lower volume with HD as able.  ESRD - on HD MWF.  Received HD 3/18, 3/20, 3/21. Challenging to do serial HD d/t high inpatient census for dialysis. Next HD 10/24/2023.  BP -chronic hypotension on high-dose midodrine at home.  Continue. Volume -Chronic volume overload.  Manage volume with HD. He signed off early yesterday d/t cramping. He needs to adhere to fluid restrictions.  Anemia of esrd -hemoglobin 10-11 here, follow. Aranesp 200 ordered for 3/21.  Secondary hyperparathyroidism -calcium in range, Phos is high, noted he's missed some doses. Continue binders with meals and ongoing  compliance. Chronic pain syndrome Atrial fibrillation - on Eliquis, digoxin Neuropathy - per primary  Dispo - pending     Aubriel Khanna H. Lealand Elting NP-C 10/22/2023, 9:55 AM  [employer] [phone]

## 2023-10-22 NOTE — Progress Notes (Signed)
 Pt refused 0600 scheduled CHG bath, pt educated on risk of infection & hospital protocol to administer daily CHG baths within a 24-hour period. Pt stated, "I will do it later". Will inform day shift RN.   Bari Edward, RN

## 2023-10-22 NOTE — Progress Notes (Addendum)
 HD#12 Subjective:   Summary: Phillip Young is a 38 y.o. with a pertinent PMH of chronic hypoxic respiratory failure on 2-4 L home O2, HFrEF, ESRD on dialysis who presents for concerns of shortness of breath and admitted for hypervolemia.   Overnight Events: None  Interim history: Patient is evaluated at bedside, was sleeping, did awake and spoke with [place]. Endorsed bilateral LE pain. Reports that he feels better this morning.   Objective:  Vital signs in last 24 hours: Vitals:   10/21/23 2015 10/21/23 2017 10/21/23 2352 10/22/23 0623  BP: (!) 128/95  124/84 (!) 92/44  Pulse:   95 97  Resp: (!) 27 17 18 20   Temp: 98.7 F (37.1 C)   (!) 97.4 F (36.3 C)  TempSrc: Oral  Axillary Oral  SpO2:   96% 96%  Weight:    121.4 kg  Height:       Supplemental O2: Nasal Cannula SpO2: 96 % O2 Flow Rate (L/min): 6 L/min   Physical Exam:  Constitutional: appears chronically ill, no acute distress  HENT: normocephalic atraumatic, mucous membranes moist Eyes: conjunctiva non-erythematous Neck: supple Cardiovascular: regular rate and rhythm, no m/r/g Pulmonary/Chest: normal work of breathing on room air, lungs clear to auscultation bilaterally Abdominal: soft, non-tender, non-distended MSK: +bilateral LE tenderness and edema  Neurological: awake, alert and answering questions appropriately  Skin: warm and dry  Filed Weights   10/21/23 0843 10/21/23 1240 10/22/23 0623  Weight: (!) 188.4 kg (!) 185.5 kg 121.4 kg     Intake/Output Summary (Last 24 hours) at 10/22/2023 1018 Last data filed at 10/21/2023 2300 Gross per 24 hour  Intake 960 ml  Output 2900 ml  Net -1940 ml   Net IO Since Admission: -8,215.2 mL [10/22/23 1018]  Pertinent Labs:    Latest Ref Rng & Units 10/22/2023    2:49 AM 10/21/2023    4:27 AM 10/20/2023    5:06 AM  CBC  WBC 4.0 - 10.5 K/uL 6.7  7.5  6.2   Hemoglobin 13.0 - 17.0 g/dL 16.1  9.9  09.6   Hematocrit 39.0 - 52.0 % 31.7  30.9  31.0   Platelets 150 -  400 K/uL 208  198  187        Latest Ref Rng & Units 10/22/2023    2:49 AM 10/21/2023    4:27 AM 10/20/2023    5:06 AM  CMP  Glucose 70 - 99 mg/dL 86  98  95   BUN 6 - 20 mg/dL 51  65  54   Creatinine 0.61 - 1.24 mg/dL 0.45  4.09  8.11   Sodium 135 - 145 mmol/L 135  135  135   Potassium 3.5 - 5.1 mmol/L 4.4  4.8  4.3   Chloride 98 - 111 mmol/L 97  98  98   CO2 22 - 32 mmol/L 21  22  22    Calcium 8.9 - 10.3 mg/dL 9.7  9.6  9.2     Imaging: No results found.  Assessment/Plan:   Principal Problem:   Acute on chronic hypoxic respiratory failure ([name]) Active Problems:   Morbid obesity ([name])   Chronic systolic CHF (congestive heart failure) ([name])   Chronic respiratory failure with hypoxia ([name])   Paroxysmal atrial flutter ([name])   ESRD (end stage renal disease) on dialysis ([name])   Hypotension   ESRD on dialysis ([name])   Hypervolemia   Type 2 diabetes mellitus with diabetic chronic kidney disease ([name])   Chronic a-fib ([name])  Sacral wound   Patient Summary: Phillip Young is a 38 y.o. with a pertinent PMH of chronic hypoxic respiratory failure on 2-4 L home O2, HFrEF, ESRD on dialysis who presents for concerns of shortness of breath and admitted for hypervolemia, receiving dialysis sessions here.    Acute on chronic hypoxic respiratory failure, on home 2-4 L oxygen Pulmonary edema Hx presumed amiodarone toxicity HFrEF, LVEF <20% ESRD on HD  Hyperphosphatemia  HAGMA  Nephrology on board, had HD session yesterday with 2.9 L UF, still endorsing LE tenderness and swelling that is apparent. Reports that he would like to try compression stockings. Patient is notified that it can possible take 2-5 days for palliative care to him as there patient census is high. Patient will be kept today for possible HD session, otherwise will discharge tomorrow with outpatient AMB referral to palliative care.    - Palliative care consulted, patient census is high, will d/c with amb referral OP  palliative care   - Continue HD sessions per nephro, will discharge tomorrow home with PTAR  - Compression socks and elevate feet  - Wean oxygen as tolerated, baseline 3-4L - Continue digoxin 0.125 mg 3 times daily on Monday, Wednesday, Friday - Continue Renvela 1600 mg 3 times daily with meals - Continue Cinacelet 30 mg daily on dialysis days   Hypotension Likely due to above with underlying vasoplegia. Continue home medication midodrine 30 mg TID    Anemia of chronic disease Hgb stable 9.9; No signs of blood loss. Continue to monitor - Aranesp 150 mcg every friday   Sacral Decubitus Ulcer, healing (previously stage IV) Hidradenitis suppurativa with draining lesion cover coccyx  - Apply warm compresses around the HS site to facilitate drainage - Clindamycin topical gel    Paroxysmal atrial fibrillation - Telemetry monitoring - Continue home Eliquis 5mg  BID, Digoxin 0.125mg  MWF   Chronic somatic and neuropathic pain Chronic opiate therapy It has been attempted to switch away from chronic oxycodone therapy particularly with history of ESRD, however patient reports dilaudid has been less effective and requested continued oxycodone.  - Continue venlafaxine 37.5mg  daily, lyrica 50mg  daily, oxycodone 10mg  q6h prn.  - Continue Tylenol 1000 mg every 6 hours for pain control - Continue PT/OT   Hypothyroidism Continue home levothyroxine   Depression Continue home venlafaxine 37.5mg    Muscle cramping Reported that it helped during dialysis, but has not been taking.   - Stop Robaxin 500 mg MWF   Resolved:  - Hyponatremia   Diet: Renal IVF: None VTE:  Eliquis BID Wounds: None Code: Full PT/OT recs: Home with PTAR TOC recs: Home with PTAR Family Update: Mother   Dispo: Anticipated discharge to Home in 1 days pending HD.   Signature: Jeral Pinch, D.O.  Internal Medicine Resident, PGY-1 [facility]  Pager: [phone] 10:18 AM,  10/22/2023   Please contact the on call pager after 5 pm and on weekends at [phone].

## 2023-10-23 LAB — CBC
HCT: 33.2 % — ABNORMAL LOW (ref 39.0–52.0)
Hemoglobin: 10.7 g/dL — ABNORMAL LOW (ref 13.0–17.0)
MCH: 33.1 pg (ref 26.0–34.0)
MCHC: 32.2 g/dL (ref 30.0–36.0)
MCV: 102.8 fL — ABNORMAL HIGH (ref 80.0–100.0)
Platelets: 227 10*3/uL (ref 150–400)
RBC: 3.23 MIL/uL — ABNORMAL LOW (ref 4.22–5.81)
RDW: 16.8 % — ABNORMAL HIGH (ref 11.5–15.5)
WBC: 6.6 10*3/uL (ref 4.0–10.5)
nRBC: 0 % (ref 0.0–0.2)

## 2023-10-23 LAB — RENAL FUNCTION PANEL
Albumin: 3.2 g/dL — ABNORMAL LOW (ref 3.5–5.0)
Anion gap: 14 (ref 5–15)
BUN: 61 mg/dL — ABNORMAL HIGH (ref 6–20)
CO2: 21 mmol/L — ABNORMAL LOW (ref 22–32)
Calcium: 10.1 mg/dL (ref 8.9–10.3)
Chloride: 97 mmol/L — ABNORMAL LOW (ref 98–111)
Creatinine, Ser: 8.41 mg/dL — ABNORMAL HIGH (ref 0.61–1.24)
GFR, Estimated: 8 mL/min — ABNORMAL LOW (ref >60)
Glucose, Bld: 79 mg/dL (ref 70–99)
Phosphorus: 7.3 mg/dL — ABNORMAL HIGH (ref 2.5–4.6)
Potassium: 5.9 mmol/L — ABNORMAL HIGH (ref 3.5–5.1)
Sodium: 132 mmol/L — ABNORMAL LOW (ref 135–145)

## 2023-10-23 MED ORDER — SODIUM ZIRCONIUM CYCLOSILICATE 10 G PO PACK
10.0000 g | PACK | Freq: Once | ORAL | Status: AC
Start: 2023-10-23 — End: 2023-10-23
  Administered 2023-10-23: 10 g via ORAL
  Filled 2023-10-23: qty 1

## 2023-10-23 MED ORDER — HYDROMORPHONE HCL 1 MG/ML IJ SOLN
1.0000 mg | Freq: Once | INTRAMUSCULAR | Status: AC
  Administered 2023-10-23: 1 mg via INTRAVENOUS
  Filled 2023-10-23: qty 1

## 2023-10-23 MED ORDER — NEPRO/CARBSTEADY PO LIQD: 237.0000 mL | Freq: Two times a day (BID) | ORAL | Status: DC

## 2023-10-23 NOTE — Progress Notes (Addendum)
 HD#13 Subjective:   Summary: 38 year old male with a past medical history of chronic hypoxic respiratory failure on 2-4 L home O2, HFrEF, ESRD on dialysis who presents for concerns of shortness of breath and admitted for hypervolemia.  Overnight Events: No acute events overnight  Patient evaluated at bedside this morning.  He states he is still having some pain.  He states he is feeling short of breath.  He denies any chest pain.  He reports a good appetite.  He states it is hard for him to walk due to the pain.  I encouraged him to walk today, as I anticipate him discharging tomorrow.  Objective:  Vital signs in last 24 hours: Vitals:   10/22/23 1415 10/22/23 1951 10/22/23 2311 10/23/23 0513  BP: (!) 131/98 (!) 112/97 91/71 (!) 122/99  Pulse: 60 (!) 122 (!) 59 74  Resp: 20 20 20 20   Temp: 98.6 F (37 C) 97.6 F (36.4 C) 98.3 F (36.8 C) 98.1 F (36.7 C)  TempSrc: Oral Oral Oral Oral  SpO2: 97% 98% 98% 100%  Weight:    (!) 184.2 kg  Height:       Supplemental O2: 4 L nasal cannula SpO2: 100 %   Physical Exam:  Constitutional: Resting in bed, no acute distress HENT: normocephalic atraumatic Cardiovascular: Irregularly irregular rhythm Pulmonary/Chest: normal work of breathing on 4 L nasal cannula Extremities: 2+ pitting edema to bilateral lower extremities, bandage noted to right great toe  Filed Weights   10/21/23 1240 10/22/23 0623 10/23/23 0513  Weight: (!) 185.5 kg 121.4 kg (!) 184.2 kg     Intake/Output Summary (Last 24 hours) at 10/23/2023 0838 Last data filed at 10/23/2023 0053 Gross per 24 hour  Intake 600 ml  Output --  Net 600 ml   Net IO Since Admission: -7,615.2 mL [10/23/23 0838]  Pertinent Labs:    Latest Ref Rng & Units 10/23/2023    4:59 AM 10/22/2023    2:49 AM 10/21/2023    4:27 AM  CBC  WBC 4.0 - 10.5 K/uL 6.6  6.7  7.5   Hemoglobin 13.0 - 17.0 g/dL 13.0  86.5  9.9   Hematocrit 39.0 - 52.0 % 33.2  31.7  30.9   Platelets 150 - 400 K/uL  227  208  198        Latest Ref Rng & Units 10/23/2023    4:59 AM 10/22/2023    2:49 AM 10/21/2023    4:27 AM  CMP  Glucose 70 - 99 mg/dL 79  86  98   BUN 6 - 20 mg/dL 61  51  65   Creatinine 0.61 - 1.24 mg/dL 7.84  6.96  2.95   Sodium 135 - 145 mmol/L 132  135  135   Potassium 3.5 - 5.1 mmol/L 5.9  4.4  4.8   Chloride 98 - 111 mmol/L 97  97  98   CO2 22 - 32 mmol/L 21  21  22    Calcium 8.9 - 10.3 mg/dL 28.4  9.7  9.6     Imaging: No results found.  Assessment/Plan:   Principal Problem:   Acute on chronic hypoxic respiratory failure ([name]) Active Problems:   Morbid obesity ([name])   Chronic systolic CHF (congestive heart failure) ([name])   Chronic respiratory failure with hypoxia ([name])   Paroxysmal atrial flutter ([name])   ESRD (end stage renal disease) on dialysis ([name])   Hypotension   ESRD on dialysis ([name])   Hypervolemia   Type  2 diabetes mellitus with diabetic chronic kidney disease ([name])   Chronic a-fib ([name])   Sacral wound   Patient Summary: Phillip Young is a 38 y.o. male with a past medical history of chronic hypoxic respiratory failure on 2-4 L home O2, HFrEF, ESRD on dialysis who presents for concerns of shortness of breath and admitted for hypervolemia   #Acute on chronic hypoxemic respiratory failure in the setting of volume overload #Presumed amiodarone toxicity #HFrEF #ESRD on dialysis #Hyperkalemia Did not have dialysis yesterday.  Total -4.6 L for hospitalization.  Oxygen requirement back to baseline.  Did want to discharge today, but unfortunately patient was hyperkalemic today.  Will need dialysis before discharge.  Did give Lokelma to temporize.  Plan on discharging tomorrow after dialysis.  I did have conversation with patient today regarding mobility.  He states he is in pain and it is hard for him to move.  I encouraged him to work with PT/OT.  Will give extra dose of pain medicine to work on mobility.  But he will need to muster up the strength to do it  himself as he will discharge home as he is declining SNF placement.   -Continue dialysis per nephrology -Continue digoxin 0.125 mg 3 times daily on Monday, Wednesday, Friday -Continue Renvela 1600 mg 3 times daily with meals -Encourage patient to get out of bed today -Lokelma provided today  #Chronic pain Patient reports having chronic pain still.  Trying our best to manage with oxycodone. This pain started after being in the ICU and intubated causing critical illness neuropathy.  It has not improved.  He reports pain today again.  Gave 1 time dose of Dilaudid today.  Encourage patient to work with physical therapy. -Continue oxycodone 10 mg every 6 hours as needed -Continue PT/OT   Sacral Decubitus Ulcer, healing (previously stage IV) Hidradenitis suppurativa with draining lesion cover coccyx  - Apply warm compresses around the HS site to facilitate drainage - Clindamycin topical gel   #Hypertension Blood pressure managing well with midodrine 30 mg 3 times daily.  He has been declining his midodrine, and I have encouraged him to take this medicine. -Continue midodrine 30 mg 3 times daily   #Anemia of chronic disease No acute concern for blood loss today.  Hemoglobin stable. -Continue Aranesp 150 mcg every Friday   #Atrial fibrillation Patient has irregularly irregular heart rate.  He is rate controlled at this time.  No acute concern for RVR. -Continue telemetry -Continue on Eliquis 5 mg twice daily   #Hypothyroidism Continue home levothyroxine   #Depression Continue home venlafaxine 37.5mg   Diet: Renal IVF: None,None VTE: DOAC Code: Full PT/OT recs: SNF for Subacute PT, patient declines  Dispo: Anticipated discharge to Home in 1 day pending dialysis.   Modena Slater DO Internal Medicine Resident PGY-2 [phone] Please contact the on call pager after 5 pm and on weekends at [phone].

## 2023-10-23 NOTE — Progress Notes (Signed)
 [facility] Progress Note   Subjective: Sitting up at bedside with no WOB present. Reminded to adhere to fluid restrictions. HD tomorrow.   Objective Vitals:   10/22/23 1415 10/22/23 1951 10/22/23 2311 10/23/23 0513  BP: (!) 131/98 (!) 112/97 91/71 (!) 122/99  Pulse: 60 (!) 122 (!) 59 74  Resp: 20 20 20 20   Temp: 98.6 F (37 C) 97.6 F (36.4 C) 98.3 F (36.8 C) 98.1 F (36.7 C)  TempSrc: Oral Oral Oral Oral  SpO2: 97% 98% 98% 100%  Weight:    (!) 184.2 kg  Height:       Physical Exam General: Obese male in NAD Heart S1,S2 irreg irreg Afib on monitor rate 90s Lungs: Decreased in bases otherwise CTAB. No SOB Abdomen:obese NABS Extremities: 1+ BLE edema Dialysis Access: RIJ TDC drsg intact  Additional Objective Labs: Basic Metabolic Panel: Recent Labs  Lab 10/21/23 0427 10/22/23 0249 10/23/23 0459  NA 135 135 132*  K 4.8 4.4 5.9*  CL 98 97* 97*  CO2 22 21* 21*  GLUCOSE 98 86 79  BUN 65* 51* 61*  CREATININE 8.64* 7.31* 8.41*  CALCIUM 9.6 9.7 10.1  PHOS 6.4* 5.7* 7.3*   Liver Function Tests: Recent Labs  Lab 10/21/23 0427 10/22/23 0249 10/23/23 0459  ALBUMIN 2.9* 3.0* 3.2*   No results for input(s): "LIPASE", "AMYLASE" in the last 168 hours. CBC: Recent Labs  Lab 10/19/23 0507 10/20/23 0506 10/21/23 0427 10/22/23 0249 10/23/23 0459  WBC 7.3 6.2 7.5 6.7 6.6  HGB 9.8* 10.1* 9.9* 10.3* 10.7*  HCT 29.8* 31.0* 30.9* 31.7* 33.2*  MCV 102.8* 104.4* 104.7* 103.3* 102.8*  PLT 186 187 198 208 227   Blood Culture    Component Value Date/Time   SDES BLOOD BLOOD RIGHT HAND 05/27/2023 0959   SPECREQUEST  05/27/2023 0959    BOTTLES DRAWN AEROBIC AND ANAEROBIC Blood Culture adequate volume   CULT  05/27/2023 0959    NO GROWTH 5 DAYS Performed at [facility], [address] [place], [place] [address]    REPTSTATUS 06/01/2023 FINAL 05/27/2023 6045    Cardiac Enzymes: No results for input(s): "CKTOTAL", "CKMB", "CKMBINDEX",  "TROPONINI" in the last 168 hours. CBG: No results for input(s): "GLUCAP" in the last 168 hours. Iron Studies: No results for input(s): "IRON", "TIBC", "TRANSFERRIN", "FERRITIN" in the last 72 hours. @lablastinr3 @ Studies/Results: No results found. Medications:  anticoagulant sodium citrate      acetaminophen  1,000 mg Oral Q6H   apixaban  5 mg Oral BID   Chlorhexidine Gluconate Cloth  6 each Topical Q0600   clindamycin   Topical BID   darbepoetin (ARANESP) injection - DIALYSIS  150 mcg Subcutaneous Q Fri-1800   digoxin  0.125 mg Oral Once per day on Monday Wednesday Friday   hydrocerin   Topical Daily    HYDROmorphone (DILAUDID) injection  1 mg Intravenous Once   levothyroxine  25 mcg Oral Q0600   midodrine  30 mg Oral TID WC   pregabalin  50 mg Oral Daily   sevelamer carbonate  1,600 mg Oral TID WC   sodium zirconium cyclosilicate  10 g Oral Once   venlafaxine XR  37.5 mg Oral Daily     Dialysis Orders: MWF [facility] Needs updating  4.5H B400    155.5 kg   2.0 bath TDC   No heparin (history of HIT) -Patient does not accept blood products -Micera Q2wks - last dose 09/30/23   Renal-related home meds: Sensipar 30 mg  MWF Midodrine 30 mg 3 times daily Renvela 1600 mg 3 times daily AC Others: Venlafaxine, Lyrica, oxycodone IR, Synthroid, digoxin, Eliquis, Narcan as needed, Zepbound   Assessment/Plan: Acute on chronic hypoxic respiratory failure: With pulmonary edema on chest x-ray. Lower extremity edema is coming down slowly. Continue to lower volume with HD as able.  ESRD - on HD MWF.  Next HD 10/24/2023.  BP -chronic hypotension on high-dose midodrine at home.  Continue. Volume -Chronic volume overload.  Manage volume with HD. He signed off early yesterday d/t cramping. He needs to adhere to fluid restrictions.  Anemia of esrd -hemoglobin 10-11 here, follow. Aranesp 200 ordered for 3/21.  Secondary hyperparathyroidism -calcium in range, Phos is high, noted he's missed some  doses. Continue binders with meals and ongoing compliance. Chronic pain syndrome Atrial fibrillation - on Eliquis, digoxin Neuropathy - per primary  Dispo - pending   Silver Achey H. Kemuel Buchmann NP-C 10/23/2023, 9:04 AM  [employer] [phone]

## 2023-10-23 NOTE — Progress Notes (Signed)
 Pt is refusing 0600 scheduled CHG bath, pt educated on potential risk of infection with HD catheter & hospital protocol to administer daily CHG baths within a 24-hour period.   Bari Edward, RN

## 2023-10-23 NOTE — Progress Notes (Signed)
 Pt refuses the midodrine because it makes him nauseous and gives him gas pains.

## 2023-10-24 LAB — CBC
HCT: 31.5 % — ABNORMAL LOW (ref 39.0–52.0)
Hemoglobin: 10.4 g/dL — ABNORMAL LOW (ref 13.0–17.0)
MCH: 33.5 pg (ref 26.0–34.0)
MCHC: 33 g/dL (ref 30.0–36.0)
MCV: 101.6 fL — ABNORMAL HIGH (ref 80.0–100.0)
Platelets: 202 10*3/uL (ref 150–400)
RBC: 3.1 MIL/uL — ABNORMAL LOW (ref 4.22–5.81)
RDW: 16.6 % — ABNORMAL HIGH (ref 11.5–15.5)
WBC: 6.7 10*3/uL (ref 4.0–10.5)
nRBC: 0 % (ref 0.0–0.2)

## 2023-10-24 LAB — RENAL FUNCTION PANEL
Albumin: 2.9 g/dL — ABNORMAL LOW (ref 3.5–5.0)
Anion gap: 19 — ABNORMAL HIGH (ref 5–15)
BUN: 78 mg/dL — ABNORMAL HIGH (ref 6–20)
CO2: 19 mmol/L — ABNORMAL LOW (ref 22–32)
Calcium: 9.7 mg/dL (ref 8.9–10.3)
Chloride: 95 mmol/L — ABNORMAL LOW (ref 98–111)
Creatinine, Ser: 9.65 mg/dL — ABNORMAL HIGH (ref 0.61–1.24)
GFR, Estimated: 7 mL/min — ABNORMAL LOW (ref >60)
Glucose, Bld: 90 mg/dL (ref 70–99)
Phosphorus: 8.1 mg/dL — ABNORMAL HIGH (ref 2.5–4.6)
Potassium: 5.6 mmol/L — ABNORMAL HIGH (ref 3.5–5.1)
Sodium: 133 mmol/L — ABNORMAL LOW (ref 135–145)

## 2023-10-24 MED ORDER — ANTICOAGULANT SODIUM CITRATE 4% (200MG/5ML) IV SOLN
5.0000 mL | Status: DC | PRN
  Filled 2023-10-24: qty 5

## 2023-10-24 MED ORDER — ANTICOAGULANT SODIUM CITRATE 4% (200MG/5ML) IV SOLN
5.0000 mL | Status: DC | PRN
Start: 2023-10-24 — End: 2023-10-24

## 2023-10-24 MED ORDER — ALTEPLASE 2 MG IJ SOLR: 2.0000 mg | Freq: Once | INTRAMUSCULAR | Status: DC | PRN

## 2023-10-24 MED ORDER — PENTAFLUOROPROP-TETRAFLUOROETH EX AERO: 1.0000 | INHALATION_SPRAY | CUTANEOUS | Status: DC | PRN

## 2023-10-24 MED ORDER — LIDOCAINE HCL (PF) 1 % IJ SOLN: 5.0000 mL | INTRAMUSCULAR | Status: DC | PRN

## 2023-10-24 MED ORDER — LIDOCAINE-PRILOCAINE 2.5-2.5 % EX CREA: 1.0000 | TOPICAL_CREAM | CUTANEOUS | Status: DC | PRN

## 2023-10-24 MED ORDER — ALTEPLASE 2 MG IJ SOLR
2.0000 mg | Freq: Once | INTRAMUSCULAR | Status: DC | PRN
Start: 2023-10-24 — End: 2023-10-24

## 2023-10-24 MED ORDER — PENTAFLUOROPROP-TETRAFLUOROETH EX AERO
1.0000 | INHALATION_SPRAY | CUTANEOUS | Status: DC | PRN
Start: 2023-10-24 — End: 2023-10-24

## 2023-10-24 NOTE — Discharge Summary (Addendum)
 Name: Phillip Young MRN: [id number] DOB: [birth year] 38 y.o. PCP: [name], DO  Date of Admission: 10/10/2023  2:43 PM Date of Discharge:  10/24/2023 Attending Physician: Dr. Cleda Daub  DISCHARGE DIAGNOSIS:  Primary Problem: Acute on chronic hypoxic respiratory failure [facility])   Hospital Problems: Principal Problem:   Acute on chronic hypoxic respiratory failure ([name]) Active Problems:   Morbid obesity ([name])   Chronic systolic CHF (congestive heart failure) ([name])   Chronic respiratory failure with hypoxia ([name])   Paroxysmal atrial flutter ([name])   ESRD (end stage renal disease) on dialysis ([name])   Hypotension   ESRD on dialysis ([name])   Hypervolemia   Type 2 diabetes mellitus with diabetic chronic kidney disease ([name])   Chronic a-fib ([name])   Sacral wound    DISCHARGE MEDICATIONS:   Allergies as of 10/24/2023       Reactions   Amiodarone Other (See Comments)   Suspicion for amiodarone lung/hepatotoxicity   Coreg [carvedilol] Shortness Of Breath, Diarrhea   Wheezing    Heparin Other (See Comments)   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, SRA positive   Metoprolol Other (See Comments)   near syncope   Amoxicillin Other (See Comments)   Was hospitalized    Other Swelling, Other (See Comments)   Steroids Fluid seeping out of legs    Tramadol         Medication List     TAKE these medications    acetaminophen 500 MG tablet Commonly known as: TYLENOL Take 2 tablets (1,000 mg total) by mouth every 6 (six) hours.   apixaban 5 MG Tabs tablet Commonly known as: ELIQUIS Take 1 tablet (5 mg total) by mouth 2 (two) times daily.   cinacalcet 30 MG tablet Commonly known as: SENSIPAR Take 1 tablet (30 mg total) by mouth every dialysis. What changed: when to take this   clindamycin 1 % gel Commonly known as: CLINDAGEL Apply topically 2 (two) times daily.   D3-1000 25 MCG (1000 UT) tablet Generic drug: Cholecalciferol Take 500 Units by mouth daily.    Darbepoetin Alfa 150 MCG/0.3ML Sosy injection Commonly known as: ARANESP To be given during dialysis by [facility] per Nephrology. Not to be given to patient at SNF.   digoxin 0.125 MG tablet Commonly known as: LANOXIN Take 1 tablet (0.125 mg total) by mouth 3 (three) times a week. Monday, Wednesday, Friday (dialysis days)   hydrocerin Crea Apply 1 Application topically daily.   levothyroxine 25 MCG tablet Commonly known as: SYNTHROID Take 1 tablet (25 mcg total) by mouth daily at 6 (six) AM.   midodrine 10 MG tablet Commonly known as: PROAMATINE Take 3 tablets (30 mg total) by mouth 3 (three) times daily with meals.   naloxone 2 MG/2ML injection Commonly known as: NARCAN Inject 2 mg into the skin as needed.   Oxycodone HCl 10 MG Tabs Take 1 tablet (10 mg total) by mouth every 6 (six) hours as needed.   pregabalin 50 MG capsule Commonly known as: LYRICA Take 1 capsule (50 mg total) by mouth daily. Take one capsule daily.   sevelamer carbonate 800 MG tablet Commonly known as: RENVELA Take 2 tablets (1,600 mg total) by mouth 3 (three) times daily with meals.   tirzepatide 2.5 MG/0.5ML injection vial Commonly known as: ZEPBOUND Inject 2.5 mg into the skin once a week.   triamcinolone ointment 0.1 % Commonly known as: KENALOG Apply topically 2 (two) times daily.   venlafaxine XR 37.5 MG 24 hr capsule Commonly  known as: Effexor XR Take 1 capsule (37.5 mg total) by mouth daily.        DISPOSITION AND FOLLOW-UP:  Phillip Young was discharged from [facility] in Good condition. At the hospital follow up visit please address:  AoCHRF/ESRD/HFrEF: Discharged with no changed to home medications. During this hospitalization, he was provided options for SNF, he refused and wanted to go home. He missed his electric wheelchair appointment, will need to follow up outpatient. Also palliative care is consulted and they will follow up outpatient.   Sacral Decubitis/HS: Clindamycin topical ointment A-fib: NSR. Patient discharged to home medication Eliquis 5 mg twice daily Chronic pain: Patient is discharged with home medications oxycodone 10 mg every 6 hours, venlafaxine 37.5 mg daily, Lyrica 50 mg daily.  Follow-up Recommendations: Consults: None  Labs: Basic Metabolic Profile and CBC Studies: None Medications: No changes to home medications   Follow-up Appointments:  Follow-up Information     [name], DO Follow up on 10/27/2023.   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 Patient did express interest in palliative care who were consulted however due to high patient census, they were unable to see him during the hospital stay.  During this hospitalization, we continued his home medication digoxin 3 times daily on Monday/Wednesday/Friday.  We continued his dialysis medication such as Renvela and Cinnacelet.  Patient was discharged with outpatient referral to palliative care.   Hypotension Likely due to above with underlying vasoplegia. Continue home medication midodrine 30 mg TID    Anemia of chronic disease Hgb stable 9.9; No signs of blood loss. Continue to monitor. Aranesp 150 mcg every friday   Sacral Decubitus Ulcer Indurated abscess  Hx of hidradenitis suppurativa Patient has indurated abscess at sacrum with skin breakdown and noted at his inner thighs likely due to friction and with history of hidradenitis suppurativa.  Wound care was  consulted, they recommended moisturizer. - Apply warm compresses around the induration site to facilitate drainage - Clindamycin topical gel for sacrum   Paroxysmal atrial fibrillation - Telemetry monitoring. Continue home Eliquis 5mg  BID, Digoxin 0.125mg  MWF   Chronic somatic and neuropathic pain Chronic opiate therapy It has been attempted to switch away from chronic oxycodone therapy particularly with history of ESRD, however patient reports dilaudid has been less effective and requested continued oxycodone.  Restarted his home medications venlafaxine 37.5 mg daily, Lyrica 50 mg daily, oxycodone 10 mg every 6 hours as needed, continue Tylenol 1000 mg every 6 hours for pain control.  Patient has been working with physical therapy and Occupational Therapy during this hospitalization.   Hypothyroidism Continue home levothyroxine   Depression Continue home venlafaxine 37.5mg    Muscle cramping Reported that it helped during dialysis, but has not been taking.   - Stop Robaxin 500 mg   Resolved:  - Hyponatremia   DISCHARGE INSTRUCTIONS:   Discharge Instructions     Amb Referral to Palliative Care   Complete by: As directed    Diet -  low sodium heart healthy   Complete by: As directed    Diet - low sodium heart healthy   Complete by: As directed    Discharge instructions   Complete by: As directed    Phillip Young, Phillip Young came to the hospital for shortness of breath and volume overload.   No changes to your medications were made.  Please continue taking your medication as prescribed.  Please make sure you follow-up for your electric wheelchair.  I sent an ambulatory referral for palliative care, they will call and get in touch with you.  Please continue your dialysis sessions.   If you have any of these following symptoms, please call us or seek care at an emergency department: -Chest Pain -Difficulty Breathing -Worsening abdominal pain -Syncope (passing  out) -Drooping of face -Slurred speech -Sudden weakness in your leg or arm -Fever -Chills -blood in the stool -dark black, sticky stool  If you have any questions or concerns, call our clinic at [phone] or after hours call [phone] and ask for the internal medicine resident on call.  I am glad you are feeling better. It was a pleasure taking care for you. I wish a good recovery and good health!   Dr. Jeral Pinch   Discharge instructions   Complete by: As directed    Phillip Young, Phillip Young came to the hospital for shortness of breath and volume overload.   No changes to your medications were made.  Please continue taking your medication as prescribed.  Please make sure you follow-up for your electric wheelchair.  I sent an ambulatory referral for palliative care, they will call and get in touch with you.  Please continue your dialysis sessions.   If you have any of these following symptoms, please call us or seek care at an emergency department: -Chest Pain -Difficulty Breathing -Worsening abdominal pain -Syncope (passing out) -Drooping of face -Slurred speech -Sudden weakness in your leg or arm -Fever -Chills -blood in the stool -dark black, sticky stool  If you have any questions or concerns, call our clinic at [phone] or after hours call [phone] and ask for the internal medicine resident on call.  I am glad you are feeling better. It was a pleasure taking care for you. I wish a good recovery and good health!   Dr. Jeral Pinch   Increase activity slowly   Complete by: As directed    Increase activity slowly   Complete by: As directed    No wound care   Complete by: As directed    No wound care   Complete by: As directed        SUBJECTIVE:  Patient is evaluated during his dialysis session.  Reports overall he is feeling well.  Denies any concerns this morning.  Denies any nausea or vomiting or abdominal pain.  Discharge Vitals:   BP  100/81   Pulse 77   Temp 98.6 F (37 C) (Oral)   Resp 20   Ht 6' (1.829 m)   Wt (!) 187.5 kg   SpO2 97%   BMI 56.06 kg/m   OBJECTIVE:  Physical Exam  General: Obese, laying in bed, no acute distress Cardiovascular: Regular rate Pulmonary: Normal work of breathing Abdomen: Soft, nontender, nondistended MSK: Chronic lower extremity edema bilaterally   Pertinent Labs, Studies, and Procedures:     Latest Ref Rng & Units 10/24/2023    5:34 AM 10/23/2023    4:59 AM 10/22/2023    2:49 AM  CBC  WBC 4.0 - 10.5 K/uL 6.7  6.6  6.7   Hemoglobin 13.0 - 17.0 g/dL 82.9  56.2  13.0   Hematocrit 39.0 - 52.0 % 31.5  33.2  31.7   Platelets 150 - 400 K/uL 202  227  208        Latest Ref Rng & Units 10/24/2023    5:34 AM 10/23/2023    4:59 AM 10/22/2023    2:49 AM  CMP  Glucose 70 - 99 mg/dL 90  79  86   BUN 6 - 20 mg/dL 78  61  51   Creatinine 0.61 - 1.24 mg/dL 8.65  7.84  6.96   Sodium 135 - 145 mmol/L 133  132  135   Potassium 3.5 - 5.1 mmol/L 5.6  5.9  4.4   Chloride 98 - 111 mmol/L 95  97  97   CO2 22 - 32 mmol/L 19  21  21    Calcium 8.9 - 10.3 mg/dL 9.7  29.5  9.7     DG Chest Portable 1 View Result Date: 10/10/2023 CLINICAL DATA:  Shortness of breath EXAM: PORTABLE CHEST 1 VIEW COMPARISON:  09/21/2023 FINDINGS: Right dialysis catheter remains in place, unchanged. Cardiomegaly with vascular congestion and probable interstitial edema. No effusions. No acute bony abnormality. IMPRESSION: Cardiomegaly with vascular congestion and probable mild interstitial edema. Electronically Signed   By: Charlett Nose M.D.   On: 10/10/2023 18:05     Signed: Jeral Pinch, D.O.  Internal Medicine Resident, PGY-1 [facility]  Pager: [phone] 3:11 PM, 10/24/2023

## 2023-10-24 NOTE — Progress Notes (Signed)
Pt refused CHG bath this morning.

## 2023-10-24 NOTE — Progress Notes (Signed)
 DISCHARGE NOTE HOME Malaky Tetrault to be discharged Home per MD order. Discussed prescriptions and follow up appointments with the patient. Prescriptions given to patient; medication list explained in detail. Patient verbalized understanding.  Skin clean, dry and intact without evidence of skin break down, no evidence of skin tears noted. IV catheter discontinued intact. Site without signs and symptoms of complications. Dressing and pressure applied. Pt denies pain at the site currently. No complaints noted.  Patient free of lines, drains, and wounds.   An After Visit Summary (AVS) was printed and given to the patient. Patient escorted via [facility].  Lorine Bears, RN

## 2023-10-24 NOTE — Progress Notes (Signed)
 D/C order noted. Contacted [facility] to be advised of pt's d/c today and that pt should resume care on Wednesday.   Olivia Canter Renal Navigator [phone]

## 2023-10-24 NOTE — Discharge Instructions (Signed)
 [name], Phillip Young came to the hospital for shortness of breath and volume overload.   No changes to your medications were made.  Please continue taking your medication as prescribed.  Please make sure you follow-up for your electric wheelchair.  I sent an ambulatory referral for palliative care, they will call and get in touch with you.  Please continue your dialysis sessions.   If you have any of these following symptoms, please call us or seek care at an emergency department: -Chest Pain -Difficulty Breathing -Worsening abdominal pain -Syncope (passing out) -Drooping of face -Slurred speech -Sudden weakness in your leg or arm -Fever -Chills -blood in the stool -dark black, sticky stool  If you have any questions or concerns, call our clinic at [phone] or after hours call [phone] and ask for the internal medicine resident on call.  I am glad you are feeling better. It was a pleasure taking care for you. I wish a good recovery and good health!   Dr. Jeral Pinch

## 2023-10-24 NOTE — Progress Notes (Addendum)
 HD#14 Subjective:   Summary: Phillip Young is a 38 y.o. with a pertinent PMH of chronic hypoxic respiratory failure on 2-4 L home O2, HFrEF, ESRD on dialysis who presents for concerns of shortness of breath and admitted for hypervolemia.   Overnight Events: None  Interim history: Patient is evaluated during his dialysis session. Reports overall he is feeling well.  Denies any concerns this morning.  Denies any nausea or vomiting or abdominal pain   Objective:  Vital signs in last 24 hours: Vitals:   10/24/23 1315 10/24/23 1325 10/24/23 1331 10/24/23 1341  BP: (!) 154/137 92/71 (!) 134/103   Pulse: 99 77    Resp: (!) 24 17 (!) 22   Temp: 98.6 F (37 C) 98.6 F (37 C)    TempSrc: Oral Oral    SpO2: 97% 97% 97%   Weight:    (!) 187.5 kg  Height:       Supplemental O2: Nasal Cannula SpO2: 97 % O2 Flow Rate (L/min): 4 L/min   Physical Exam:   General: Obese, laying in bed, no acute distress Cardiovascular: Regular rate Pulmonary: Normal work of breathing Abdomen: Soft, nontender, nondistended MSK: Chronic lower extremity edema bilaterally   Filed Weights   10/24/23 0500 10/24/23 0821 10/24/23 1341  Weight: (!) 189.2 kg (!) 192.1 kg (!) 187.5 kg     Intake/Output Summary (Last 24 hours) at 10/24/2023 1407 Last data filed at 10/24/2023 1325 Gross per 24 hour  Intake 600 ml  Output 4600 ml  Net -4000 ml   Net IO Since Admission: -10,684.2 mL [10/24/23 1407]  Pertinent Labs:    Latest Ref Rng & Units 10/24/2023    5:34 AM 10/23/2023    4:59 AM 10/22/2023    2:49 AM  CBC  WBC 4.0 - 10.5 K/uL 6.7  6.6  6.7   Hemoglobin 13.0 - 17.0 g/dL 29.5  28.4  13.2   Hematocrit 39.0 - 52.0 % 31.5  33.2  31.7   Platelets 150 - 400 K/uL 202  227  208        Latest Ref Rng & Units 10/24/2023    5:34 AM 10/23/2023    4:59 AM 10/22/2023    2:49 AM  CMP  Glucose 70 - 99 mg/dL 90  79  86   BUN 6 - 20 mg/dL 78  61  51   Creatinine 0.61 - 1.24 mg/dL 4.40  1.02  7.25   Sodium  135 - 145 mmol/L 133  132  135   Potassium 3.5 - 5.1 mmol/L 5.6  5.9  4.4   Chloride 98 - 111 mmol/L 95  97  97   CO2 22 - 32 mmol/L 19  21  21    Calcium 8.9 - 10.3 mg/dL 9.7  36.6  9.7     Imaging: No results found.  Assessment/Plan:   Principal Problem:   Acute on chronic hypoxic respiratory failure ([name]) Active Problems:   Morbid obesity ([name])   Chronic systolic CHF (congestive heart failure) ([name])   Chronic respiratory failure with hypoxia ([name])   Paroxysmal atrial flutter ([name])   ESRD (end stage renal disease) on dialysis ([name])   Hypotension   ESRD on dialysis ([name])   Hypervolemia   Type 2 diabetes mellitus with diabetic chronic kidney disease ([name])   Chronic a-fib ([name])   Sacral wound   PLAN:  Acute on chronic hypoxic respiratory failure, on home 2-4 L oxygen Pulmonary edema Hx presumed amiodarone toxicity HFrEF, LVEF <20%  ESRD on HD  Hyperphosphatemia  HAGMA  Nephrology on board, receiving HD today. No concerns. Stable for discharge, but held up because SW did not arrange patient's outpatient transportation for dialysis since patient has been admitted. Patient will need to stay until his transportation dialysis is arranged. Medically stable for discharge.     - Palliative care will follow up outpatient  - Compression socks and elevate feet  - Wean oxygen as tolerated, baseline 3-4L - Continue digoxin 0.125 mg 3 times daily on Monday, Wednesday, Friday - Continue Renvela 1600 mg 3 times daily with meals - Continue Cinacelet 30 mg daily on dialysis days - Pending discharge   Hypotension Likely due to above with underlying vasoplegia. Continue home medication midodrine 30 mg TID    Anemia of chronic disease Hgb stable; No signs of blood loss. Continue to monitor - Aranesp 150 mcg every friday   Sacral Decubitus Ulcer, healing (previously stage IV) Hidradenitis suppurativa with draining lesion cover coccyx  - Apply warm compresses around the HS site to  facilitate drainage - Clindamycin topical gel    Paroxysmal atrial fibrillation - Telemetry monitoring - Continue home Eliquis 5mg  BID, Digoxin 0.125mg  MWF   Chronic somatic and neuropathic pain Chronic opiate therapy It has been attempted to switch away from chronic oxycodone therapy particularly with history of ESRD, however patient reports dilaudid has been less effective and requested continued oxycodone.  - Continue venlafaxine 37.5mg  daily, lyrica 50mg  daily, oxycodone 10mg  q6h prn.  - Continue Tylenol 1000 mg every 6 hours for pain control - Continue PT/OT   Hypothyroidism Continue home levothyroxine   Depression Continue home venlafaxine 37.5mg    Muscle cramping Reported that it helped during dialysis, but has not been taking.   - Stop Robaxin 500 mg MWF   Resolved:  - Hyponatremia   Diet: Carb/Renal IVF: None VTE:  Eliquis BID Wounds: As above  Code: Full PT/OT recs: Home with [facility] PT/OT/Aide/RN  TOC recs: Pending transportation  Family Update: Mother    Dispo: Anticipated discharge to Home in 1 days pending transportation.   Signature: Jeral Pinch, D.O.  Internal Medicine Resident, PGY-1 [facility]  Pager: [phone] 2:07 PM, 10/24/2023   Please contact the on call pager after 5 pm and on weekends at [phone].

## 2023-10-24 NOTE — Plan of Care (Signed)
    Referral received for Phillip Young : severe leg pain. Medical records reviewed and case discussed with PMT Medical Director Julaine Fusi DO. Noted pending discharge and agree with appropriateness of outpatient palliative care follow up.  The palliative medicine team is more than happy to assist with goals of care if patient desires, but we will not be able to assist with chronic pain management for this patient.   PMT will get involved to assist with inpatient pain management related to life limiting illness (ie- cancer pain, calciphylaxis) because in situations such as cancer pain, for example, we have someone to hand off that management to (ie- cancer center palliative clinic, oncologist already involved, hospice, etc). If we get involved in non-life limiting chronic pain and make adjustments, there is no appropriate outpatient handoff for continued management with opioid Rx and no means of refill and follow-up. For this reason, we do not get involved in chronic pain management for these types of patients.    Richardson Dopp, [facility] Palliative Medicine Team  Team Phone # [phone]   NO CHARGE

## 2023-10-24 NOTE — TOC Transition Note (Addendum)
 Transition of Care [facility]) - Discharge Note   Patient Details  Name: Phillip Young MRN: [id number] Date of Birth: 1985/08/31  Transition of Care [facility]) CM/SW Contact:  Gala Lewandowsky, RN Phone Number: 10/24/2023, 3:13 PM   Clinical Narrative:  Patient will transition home today via [facility]. Patient has declined SNF; therefore, the plan will be for home with home health. [name] is aware that the patient has been discharged and will return home via [place]. CSW spoke with mother regarding outpatient transportation for HD. Case Manager has called [facility] to make them aware that the patient will transition home today to coordinate home health services. [employer] is aware that the patient will transition home today as well. Patient has oxygen in the home along with an NIV via [employer]. Case Manager spoke with patient and mother regarding outpatient palliative services- services were declined- mother states she has had a bad experience with palliative in the past and does not want them in the home-MD aware. Case Manager offered a different agency and mother still declined. Mother is receiving assistance from the HD CSW and CSW from the Department of Social Services for [employer]. Case Manager called [facility] and they will arrive within the hour. No further needs identified at this time.      10-24-23 1640 Case Manager called Mother to make her aware that patient was leaving th hospital via [facility]. No further needs identified.   Final next level of care: Home w Home Health Services Barriers to Discharge: No Barriers Identified   Patient Goals and CMS Choice Patient states their goals for this hospitalization and ongoing recovery are:: plan to to return home once stable.  Discharge Plan and Services Additional resources added to the After Visit Summary for     Discharge Planning Services: CM Consult Post Acute Care Choice: Home Health            HH Arranged: RN, Disease  Management, OT, Nurse's Aide HH Agency: [facility] Date HH Agency Contacted: 10/13/23 Time HH Agency Contacted: 1130 Representative spoke with at [facility] Agency: Adele Dan  Social Drivers of Health (SDOH) Interventions SDOH Screenings   Food Insecurity: Food Insecurity Present (10/11/2023)  Housing: High Risk (10/11/2023)  Transportation Needs: Unmet Transportation Needs (10/11/2023)  Utilities: At Risk (10/11/2023)  Alcohol Screen: Low Risk  (10/06/2023)  Depression (PHQ2-9): High Risk (10/06/2023)  Financial Resource Strain: Low Risk  (10/06/2023)  Physical Activity: Insufficiently Active (10/06/2023)  Social Connections: Socially Isolated (10/06/2023)  Stress: No Stress Concern Present (10/06/2023)  Tobacco Use: Medium Risk (10/11/2023)  Health Literacy: Adequate Health Literacy (10/06/2023)   Readmission Risk Interventions    10/13/2023   11:27 AM 09/23/2023    5:05 PM 07/05/2023    4:25 PM  Readmission Risk Prevention Plan  Transportation Screening Complete Complete Complete  HRI or Home Care Consult   Complete  Social Work Consult for Recovery Care Planning/Counseling   Complete  Palliative Care Screening   Not Applicable  Medication Review Oceanographer) Referral to Pharmacy Referral to Pharmacy Complete  PCP or Specialist appointment within 3-5 days of discharge  Complete   HRI or Home Care Consult Complete Complete   SW Recovery Care/Counseling Consult Complete Complete   Palliative Care Screening Not Applicable Not Applicable   Skilled Nursing Facility Not Applicable Not Applicable

## 2023-10-24 NOTE — Progress Notes (Addendum)
 Received patient in bed to unit.  Alert and oriented.  Informed consent signed and in chart.   TX duration:4 hours  Patient tolerated well.  Transported back to the room  Alert, without acute distress.  Hand-off given to patient's nurse.   Access used: R internal jugular HD Cath Access issues: none  Total UF removed: 4.6L Medication(s) given: 400 cc bolus to help low BP and cramps, midodrine, oxycodone, Sensispar   10/24/23 1325  Vitals  Temp 98.6 F (37 C)  Temp Source Oral  BP 92/71  MAP (mmHg) 80  Pulse Rate 77  ECG Heart Rate (!) 105  Resp 17  Oxygen Therapy  SpO2 97 %  O2 Device Nasal Cannula  O2 Flow Rate (L/min) 4 L/min  During Treatment Monitoring  Dialysis Fluid Bolus Normal Saline  Bolus Amount (mL) 300 mL  Post Treatment  Dialyzer Clearance Lightly streaked  Liters Processed 108  Fluid Removed (mL) 4600 mL  Tolerated HD Treatment Yes     Stacie Glaze LPN [facility]

## 2023-10-24 NOTE — TOC Progression Note (Signed)
 Transition of Care [facility]) - Progression Note    Patient Details  Name: Phillip Young MRN: [id number] Date of Birth: 1985-10-03  Transition of Care [facility]) CM/SW Contact  Delilah Shan, LCSWA Phone Number: 10/24/2023, 2:35 PM  Clinical Narrative:     CSW spoke with patient and patients mother [name] in regards to HD transportation.  Patient confirmed he would like to reapply to [facility] to see if eligible for transportation services. Application completed and  submitted to [place] [facility]. With [facility]. Patients mother informed CSW that her or patients [name] will transport patient to HD until HD transportation is in place for patient. Patients mother plans on following up with status of application for patient. CSW provided patients mother with [facility] telephone number. Patient confirmed he will transport home by [facility] when medically ready for dc. All questions answered. No further questions reported at this time.CSW submitted [facility] application to [employer].   Expected Discharge Plan: Home w Home Health Services Barriers to Discharge: Continued Medical Work up  Expected Discharge Plan and Services   Discharge Planning Services: CM Consult Post Acute Care Choice: Home Health Living arrangements for the past 2 months: Apartment Expected Discharge Date: 10/24/23                         HH Arranged: RN, Disease Management, OT, Nurse's Aide HH Agency: [facility] Date HH Agency Contacted: 10/13/23 Time HH Agency Contacted: 1130 Representative spoke with at [facility] Agency: Adele Dan   Social Determinants of Health (SDOH) Interventions SDOH Screenings   Food Insecurity: Food Insecurity Present (10/11/2023)  Housing: High Risk (10/11/2023)  Transportation Needs: Unmet Transportation Needs (10/11/2023)  Utilities: At Risk (10/11/2023)  Alcohol Screen: Low Risk  (10/06/2023)  Depression (PHQ2-9): High Risk (10/06/2023)  Financial Resource Strain: Low  Risk  (10/06/2023)  Physical Activity: Insufficiently Active (10/06/2023)  Social Connections: Socially Isolated (10/06/2023)  Stress: No Stress Concern Present (10/06/2023)  Tobacco Use: Medium Risk (10/11/2023)  Health Literacy: Adequate Health Literacy (10/06/2023)    Readmission Risk Interventions    10/13/2023   11:27 AM 09/23/2023    5:05 PM 07/05/2023    4:25 PM  Readmission Risk Prevention Plan  Transportation Screening Complete Complete Complete  HRI or Home Care Consult   Complete  Social Work Consult for Recovery Care Planning/Counseling   Complete  Palliative Care Screening   Not Applicable  Medication Review Oceanographer) Referral to Pharmacy Referral to Pharmacy Complete  PCP or Specialist appointment within 3-5 days of discharge  Complete   HRI or Home Care Consult Complete Complete   SW Recovery Care/Counseling Consult Complete Complete   Palliative Care Screening Not Applicable Not Applicable   Skilled Nursing Facility Not Applicable Not Applicable

## 2023-10-24 NOTE — Progress Notes (Signed)
 [facility] Progress Note   Subjective:   Seen on HD. Reports he is tired today. Otherwise no concerns reported.   Objective Vitals:   10/24/23 0547 10/24/23 0821 10/24/23 0837 10/24/23 0900  BP: 111/68 (!) 152/129 105/73 (!) 132/119  Pulse: 92 94 86 77  Resp: 20 20    Temp: 98.2 F (36.8 C) 98.6 F (37 C)    TempSrc: Oral     SpO2: 98% 99% 98% 100%  Weight:  (!) 192.1 kg    Height:       Physical Exam General: Alert male in NAD Heart: irregularly irregular, rate controlled, no murmurs, rubs or gallops Lungs: CTA bilaterally, respirations unlabored on RA Abdomen: Soft, non-distended, +BS Extremities: 1+ edema bilateral lower extremities Dialysis Access:  R internal jugular TDC accessed  Additional Objective Labs: Basic Metabolic Panel: Recent Labs  Lab 10/22/23 0249 10/23/23 0459 10/24/23 0534  NA 135 132* 133*  K 4.4 5.9* 5.6*  CL 97* 97* 95*  CO2 21* 21* 19*  GLUCOSE 86 79 90  BUN 51* 61* 78*  CREATININE 7.31* 8.41* 9.65*  CALCIUM 9.7 10.1 9.7  PHOS 5.7* 7.3* 8.1*   Liver Function Tests: Recent Labs  Lab 10/22/23 0249 10/23/23 0459 10/24/23 0534  ALBUMIN 3.0* 3.2* 2.9*   No results for input(s): "LIPASE", "AMYLASE" in the last 168 hours. CBC: Recent Labs  Lab 10/20/23 0506 10/21/23 0427 10/22/23 0249 10/23/23 0459 10/24/23 0534  WBC 6.2 7.5 6.7 6.6 6.7  HGB 10.1* 9.9* 10.3* 10.7* 10.4*  HCT 31.0* 30.9* 31.7* 33.2* 31.5*  MCV 104.4* 104.7* 103.3* 102.8* 101.6*  PLT 187 198 208 227 202   Blood Culture    Component Value Date/Time   SDES BLOOD BLOOD RIGHT HAND 05/27/2023 0959   SPECREQUEST  05/27/2023 0959    BOTTLES DRAWN AEROBIC AND ANAEROBIC Blood Culture adequate volume   CULT  05/27/2023 0959    NO GROWTH 5 DAYS Performed at [facility], [address] [place], [place] [address]    REPTSTATUS 06/01/2023 FINAL 05/27/2023 6578   Medications:  anticoagulant sodium citrate     anticoagulant sodium citrate       acetaminophen  1,000 mg Oral Q6H   apixaban  5 mg Oral BID   Chlorhexidine Gluconate Cloth  6 each Topical Q0600   clindamycin   Topical BID   darbepoetin (ARANESP) injection - DIALYSIS  150 mcg Subcutaneous Q Fri-1800   digoxin  0.125 mg Oral Once per day on Monday Wednesday Friday   feeding supplement (NEPRO CARB STEADY)  237 mL Oral BID BM   hydrocerin   Topical Daily   levothyroxine  25 mcg Oral Q0600   midodrine  30 mg Oral TID WC   pregabalin  50 mg Oral Daily   sevelamer carbonate  1,600 mg Oral TID WC   venlafaxine XR  37.5 mg Oral Daily    Dialysis Orders: MWF [facility] Needs updating  4.5H B400    155.5 kg   2.0 bath TDC   No heparin (history of HIT) -Patient does not accept blood products -Micera Q2wks - last dose 09/30/23   Renal-related home meds: Sensipar 30 mg MWF Midodrine 30 mg 3 times daily Renvela 1600 mg 3 times daily AC Others: Venlafaxine, Lyrica, oxycodone IR, Synthroid, digoxin, Eliquis, Narcan as needed, Zepbound  Assessment/Plan: 1. Acute on chronic hypoxic respiratory failure: With pulmonary edema on chest x-ray. Lower extremity edema is coming down slowly. Continue to lower volume with HD  as able.  2. ESRD - on HD MWF.  Tolerating well. No heparin w/ HD.  3. BP -chronic hypotension on high-dose midodrine at home.  Continue. 4. Volume -Chronic volume overload.  Manage volume with HD. He signed off early yesterday d/t cramping. He needs to adhere to fluid restrictions.  5. Anemia of esrd -hemoglobin 10-11 here, follow. Continue Aranesp weekly 6. Secondary hyperparathyroidism -Corrected calcium slightly high, not on VDRA. Phos elevated, has been missing some binders. Continue renvela with meals.  7. Atrial fibrillation - on Eliquis, digoxin 8. Dispo - pending   Rogers Blocker, PA-C 10/24/2023, 9:12 AM  [facility] Pager: [phone]

## 2023-10-25 ENCOUNTER — Telehealth: Payer: Self-pay

## 2023-10-25 ENCOUNTER — Telehealth: Payer: Self-pay | Admitting: *Deleted

## 2023-10-25 NOTE — Progress Notes (Signed)
 Complex Care Management Note Care Guide Note  10/25/2023 Name: Phillip Young MRN: [id number] DOB: 05-29-1986   Complex Care Management Outreach Attempts: A third unsuccessful outreach was attempted today to offer the patient with information about available complex care management services.  Follow Up Plan:  No further outreach attempts will be made at this time. We have been unable to contact the patient to offer or enroll patient in complex care management services.  Encounter Outcome:  No Answer  Clyde Lundborg HealthPopulation Health Care Guide  Direct Dial:[phone] Fax:[phone] Website: [email or web address]

## 2023-10-25 NOTE — Discharge Planning (Signed)
 [facility] Patient Discharge Orders- [facility] CLINIC: [facility]  Patient's name: Phillip Young Admit/DC Dates: 10/10/2023 - 10/24/2023  Discharge Diagnoses: Acute on chronic hypoxic respiratory failure   Hypotension  Aranesp: Given: yes   Date and amount of last dose: on 10/21/23 Last Hgb: 10.4 PRBC's Given: none Date/# of units: n/a ESA dose for discharge: mircera 200 mcg IV q 2 weeks  IV Iron dose at discharge: none  Heparin change: no  EDW Change: no New EDW:   Bath Change: no  Access intervention/Change: no Details:  Hectorol/Calcitriol change: no  Discharge Labs: Calcium 9.7 Phosphorus 8.1 Albumin 2.9 K+ 5.6  IV Antibiotics: no Details:  On Coumadin?: no Last INR: Next INR: Managed By:   OTHER/APPTS/LAB ORDERS: weights variable here (bed weights), please call if EDW needs to be updated    D/C Meds to be reconciled by nurse after every discharge.  Completed By: Rogers Blocker, PA-C 10/25/2023, 8:28 AM  [facility] Pager: [phone]   Reviewed by: MD:______ RN_______

## 2023-10-25 NOTE — Transitions of Care (Post Inpatient/ED Visit) (Signed)
   10/25/2023  Name: Phillip Young MRN: [id number] DOB: 1986-03-24  Today's TOC FU Call Status: Today's TOC FU Call Status:: Unsuccessful Call (1st Attempt) Unsuccessful Call (1st Attempt) Date: 10/25/23  Attempted to reach the patient regarding the most recent Inpatient/ED visit.  Follow Up Plan: Additional outreach attempts will be made to reach the patient to complete the Transitions of Care (Post Inpatient/ED visit) call.   Hilbert Odor RN, CCM [place]  VBCI-Population Health RN Care Manager [phone]

## 2023-10-26 ENCOUNTER — Telehealth: Payer: Self-pay

## 2023-10-26 NOTE — Transitions of Care (Post Inpatient/ED Visit) (Signed)
   10/26/2023  Name: Phillip Young MRN: [id number] DOB: [birth year]  Today's TOC FU Call Status: Today's TOC FU Call Status:: Unsuccessful Call (2nd Attempt) Unsuccessful Call (2nd Attempt) Date: 10/26/23  Attempted to reach the patient regarding the most recent Inpatient/ED visit.Patient stated he was at dialysis and requested call back tomorrow, 10/27/23  Follow Up Plan: Additional outreach attempts will be made to reach the patient to complete the Transitions of Care (Post Inpatient/ED visit) call.   Hilbert Odor RN, CCM [place]  VBCI-Population Health RN Care Manager [phone]

## 2023-10-27 ENCOUNTER — Telehealth: Payer: Self-pay | Admitting: *Deleted

## 2023-10-27 ENCOUNTER — Telehealth: Payer: Self-pay

## 2023-10-27 ENCOUNTER — Encounter: Admitting: Student

## 2023-10-27 ENCOUNTER — Other Ambulatory Visit: Payer: Self-pay | Admitting: Student

## 2023-10-27 NOTE — Telephone Encounter (Signed)
 Copied from CRM [id number]. Topic: Clinical - Medication Refill >> Oct 27, 2023 10:47 AM Alfred Levins wrote: Most Recent Primary Care Visit:  Provider: Derrell Lolling  Department: [facility]  Visit Type: MEDICARE AWV, INITIAL  Date: 10/06/2023  Medication: Oxycodone HCl 10 MG TABS  Has the patient contacted their pharmacy? No (Agent: If no, request that the patient contact the pharmacy for the refill. If patient does not wish to contact the pharmacy document the reason why and proceed with request.) (Agent: If yes, when and what did the pharmacy advise?)  Is this the correct pharmacy for this prescription? Yes If no, delete pharmacy and type the correct one.  This is the patient's preferred pharmacy:  [id number] - [place], [place] - [address] AT [facility] OF [address] [place] [address] Phone: [phone] Fax: [phone]      Is the patient out of the medication? No  Has the patient been seen for an appointment in the last year OR does the patient have an upcoming appointment? Yes  Can we respond through MyChart? Yes  Agent: Please be advised that Rx refills may take up to 3 business days. We ask that you follow-up with your pharmacy.

## 2023-10-27 NOTE — Telephone Encounter (Signed)
 RTC to [facility].  1 time a week for 8 weeks. Will be working on Mobility, Endurance and transfers . Approval given .  Will forward to PCP for approval or denial.

## 2023-10-27 NOTE — Transitions of Care (Post Inpatient/ED Visit) (Signed)
   10/27/2023  Name: Phillip Young MRN: [id number] DOB: [birth year]  Today's TOC FU Call Status: Today's TOC FU Call Status:: Unsuccessful Call (3rd Attempt) Unsuccessful Call (3rd Attempt) Date: 10/27/23  Attempted to reach the patient regarding the most recent Inpatient/ED visit.  Follow Up Plan: No further outreach attempts will be made at this time. We have been unable to contact the patient.  Hilbert Odor RN, CCM [place]  VBCI-Population Health RN Care Manager [phone]

## 2023-10-27 NOTE — Telephone Encounter (Signed)
 Copied from CRM [id number]. Topic: Appointments - Appointment Cancel/Reschedule >> Oct 27, 2023 10:57 AM Alfred Levins wrote: Patient would like to reschedule his appt, patient states he can not walk or get dressed. Please advise   Spoke with the patient, Appt has been resch:  Name: Apolo, [place] MRN: [id number]  Date: 11/08/2023 Status: Sch  Time: 3:45 PM Length: 30  Visit Type: OPEN ESTABLISHED [726] Copay: $0.00  Provider: Champ Mungo, DO

## 2023-10-28 MED ORDER — OXYCODONE HCL 10 MG PO TABS: 10.0000 mg | ORAL_TABLET | Freq: Four times a day (QID) | ORAL | 0 refills | Status: DC | PRN

## 2023-10-31 NOTE — Telephone Encounter (Signed)
 Forms should be coming to the doctor's box for signature.

## 2023-11-01 ENCOUNTER — Emergency Department (HOSPITAL_COMMUNITY)

## 2023-11-01 ENCOUNTER — Encounter (HOSPITAL_COMMUNITY): Payer: Self-pay

## 2023-11-01 ENCOUNTER — Other Ambulatory Visit: Payer: Self-pay

## 2023-11-01 ENCOUNTER — Telehealth: Payer: Self-pay

## 2023-11-01 ENCOUNTER — Inpatient Hospital Stay (HOSPITAL_COMMUNITY)
Admission: EM | Admit: 2023-11-01 | Discharge: 2023-11-12 | DRG: 871 | Disposition: A | Discharge status: Home / Self Care | Attending: Student in an Organized Health Care Education/Training Program | Admitting: Student in an Organized Health Care Education/Training Program

## 2023-11-01 DIAGNOSIS — I428 Other cardiomyopathies: Secondary | ICD-10-CM | POA: Diagnosis present

## 2023-11-01 DIAGNOSIS — K219 Gastro-esophageal reflux disease without esophagitis: Secondary | ICD-10-CM | POA: Diagnosis present

## 2023-11-01 DIAGNOSIS — B952 Enterococcus as the cause of diseases classified elsewhere: Secondary | ICD-10-CM | POA: Diagnosis present

## 2023-11-01 DIAGNOSIS — L8990 Pressure ulcer of unspecified site, unspecified stage: Secondary | ICD-10-CM | POA: Diagnosis present

## 2023-11-01 DIAGNOSIS — I5082 Biventricular heart failure: Secondary | ICD-10-CM | POA: Diagnosis present

## 2023-11-01 DIAGNOSIS — E8770 Fluid overload, unspecified: Principal | ICD-10-CM | POA: Diagnosis present

## 2023-11-01 DIAGNOSIS — Z79891 Long term (current) use of opiate analgesic: Secondary | ICD-10-CM

## 2023-11-01 DIAGNOSIS — Z8249 Family history of ischemic heart disease and other diseases of the circulatory system: Secondary | ICD-10-CM

## 2023-11-01 DIAGNOSIS — Z87891 Personal history of nicotine dependence: Secondary | ICD-10-CM

## 2023-11-01 DIAGNOSIS — M898X9 Other specified disorders of bone, unspecified site: Secondary | ICD-10-CM | POA: Diagnosis present

## 2023-11-01 DIAGNOSIS — I959 Hypotension, unspecified: Secondary | ICD-10-CM | POA: Diagnosis present

## 2023-11-01 DIAGNOSIS — B957 Other staphylococcus as the cause of diseases classified elsewhere: Secondary | ICD-10-CM | POA: Diagnosis present

## 2023-11-01 DIAGNOSIS — I9589 Other hypotension: Secondary | ICD-10-CM | POA: Diagnosis present

## 2023-11-01 DIAGNOSIS — A4181 Sepsis due to Enterococcus: Secondary | ICD-10-CM | POA: Diagnosis not present

## 2023-11-01 DIAGNOSIS — Z7901 Long term (current) use of anticoagulants: Secondary | ICD-10-CM

## 2023-11-01 DIAGNOSIS — G8929 Other chronic pain: Secondary | ICD-10-CM | POA: Diagnosis present

## 2023-11-01 DIAGNOSIS — Z9189 Other specified personal risk factors, not elsewhere classified: Secondary | ICD-10-CM

## 2023-11-01 DIAGNOSIS — Z91141 Patient's other noncompliance with medication regimen due to financial hardship: Secondary | ICD-10-CM

## 2023-11-01 DIAGNOSIS — Z79899 Other long term (current) drug therapy: Secondary | ICD-10-CM

## 2023-11-01 DIAGNOSIS — J9621 Acute and chronic respiratory failure with hypoxia: Secondary | ICD-10-CM | POA: Diagnosis present

## 2023-11-01 DIAGNOSIS — L03312 Cellulitis of back [any part except buttock]: Secondary | ICD-10-CM | POA: Diagnosis present

## 2023-11-01 DIAGNOSIS — Z79890 Hormone replacement therapy (postmenopausal): Secondary | ICD-10-CM

## 2023-11-01 DIAGNOSIS — Z515 Encounter for palliative care: Secondary | ICD-10-CM

## 2023-11-01 DIAGNOSIS — N2581 Secondary hyperparathyroidism of renal origin: Secondary | ICD-10-CM | POA: Diagnosis present

## 2023-11-01 DIAGNOSIS — I482 Chronic atrial fibrillation, unspecified: Secondary | ICD-10-CM | POA: Diagnosis present

## 2023-11-01 DIAGNOSIS — Z1629 Resistance to other single specified antibiotic: Secondary | ICD-10-CM | POA: Diagnosis present

## 2023-11-01 DIAGNOSIS — S31000A Unspecified open wound of lower back and pelvis without penetration into retroperitoneum, initial encounter: Secondary | ICD-10-CM | POA: Diagnosis present

## 2023-11-01 DIAGNOSIS — R7881 Bacteremia: Secondary | ICD-10-CM | POA: Diagnosis present

## 2023-11-01 DIAGNOSIS — E872 Acidosis, unspecified: Principal | ICD-10-CM | POA: Diagnosis present

## 2023-11-01 DIAGNOSIS — Z1611 Resistance to penicillins: Secondary | ICD-10-CM | POA: Diagnosis present

## 2023-11-01 DIAGNOSIS — Z992 Dependence on renal dialysis: Secondary | ICD-10-CM

## 2023-11-01 DIAGNOSIS — F4323 Adjustment disorder with mixed anxiety and depressed mood: Secondary | ICD-10-CM | POA: Diagnosis present

## 2023-11-01 DIAGNOSIS — Z9981 Dependence on supplemental oxygen: Secondary | ICD-10-CM

## 2023-11-01 DIAGNOSIS — I4819 Other persistent atrial fibrillation: Secondary | ICD-10-CM | POA: Diagnosis present

## 2023-11-01 DIAGNOSIS — G4733 Obstructive sleep apnea (adult) (pediatric): Secondary | ICD-10-CM | POA: Diagnosis present

## 2023-11-01 DIAGNOSIS — F1190 Opioid use, unspecified, uncomplicated: Secondary | ICD-10-CM | POA: Diagnosis present

## 2023-11-01 DIAGNOSIS — E875 Hyperkalemia: Secondary | ICD-10-CM | POA: Diagnosis not present

## 2023-11-01 DIAGNOSIS — R0602 Shortness of breath: Secondary | ICD-10-CM | POA: Diagnosis not present

## 2023-11-01 DIAGNOSIS — I5022 Chronic systolic (congestive) heart failure: Secondary | ICD-10-CM | POA: Diagnosis present

## 2023-11-01 DIAGNOSIS — D631 Anemia in chronic kidney disease: Secondary | ICD-10-CM | POA: Diagnosis present

## 2023-11-01 DIAGNOSIS — E871 Hypo-osmolality and hyponatremia: Secondary | ICD-10-CM

## 2023-11-01 DIAGNOSIS — E66813 Obesity, class 3: Secondary | ICD-10-CM | POA: Diagnosis present

## 2023-11-01 DIAGNOSIS — N186 End stage renal disease: Secondary | ICD-10-CM

## 2023-11-01 DIAGNOSIS — Z5941 Food insecurity: Secondary | ICD-10-CM

## 2023-11-01 DIAGNOSIS — E039 Hypothyroidism, unspecified: Secondary | ICD-10-CM | POA: Diagnosis present

## 2023-11-01 DIAGNOSIS — Z91158 Patient's noncompliance with renal dialysis for other reason: Secondary | ICD-10-CM

## 2023-11-01 DIAGNOSIS — Z5982 Transportation insecurity: Secondary | ICD-10-CM

## 2023-11-01 LAB — CBC WITH DIFFERENTIAL/PLATELET
Abs Immature Granulocytes: 0.02 10*3/uL (ref 0.00–0.07)
Basophils Absolute: 0.1 10*3/uL (ref 0.0–0.1)
Basophils Relative: 2 %
Eosinophils Absolute: 1.2 10*3/uL — ABNORMAL HIGH (ref 0.0–0.5)
Eosinophils Relative: 14 %
HCT: 33.8 % — ABNORMAL LOW (ref 39.0–52.0)
Hemoglobin: 10.5 g/dL — ABNORMAL LOW (ref 13.0–17.0)
Immature Granulocytes: 0 %
Lymphocytes Relative: 11 %
Lymphs Abs: 0.9 10*3/uL (ref 0.7–4.0)
MCH: 33.1 pg (ref 26.0–34.0)
MCHC: 31.1 g/dL (ref 30.0–36.0)
MCV: 106.6 fL — ABNORMAL HIGH (ref 80.0–100.0)
Monocytes Absolute: 0.7 10*3/uL (ref 0.1–1.0)
Monocytes Relative: 8 %
Neutro Abs: 5.5 10*3/uL (ref 1.7–7.7)
Neutrophils Relative %: 65 %
Platelets: 178 10*3/uL (ref 150–400)
RBC: 3.17 MIL/uL — ABNORMAL LOW (ref 4.22–5.81)
RDW: 16.8 % — ABNORMAL HIGH (ref 11.5–15.5)
WBC: 8.5 10*3/uL (ref 4.0–10.5)
nRBC: 0 % (ref 0.0–0.2)

## 2023-11-01 LAB — RESP PANEL BY RT-PCR (RSV, FLU A&B, COVID)  RVPGX2
Influenza A by PCR: NEGATIVE
Influenza B by PCR: NEGATIVE
Resp Syncytial Virus by PCR: NEGATIVE
SARS Coronavirus 2 by RT PCR: NEGATIVE

## 2023-11-01 LAB — TROPONIN I (HIGH SENSITIVITY)
Troponin I (High Sensitivity): 54 ng/L — ABNORMAL HIGH (ref <18)
Troponin I (High Sensitivity): 54 ng/L — ABNORMAL HIGH (ref <18)

## 2023-11-01 LAB — COMPREHENSIVE METABOLIC PANEL WITH GFR
ALT: 16 U/L (ref 0–44)
AST: 19 U/L (ref 15–41)
Albumin: 3.1 g/dL — ABNORMAL LOW (ref 3.5–5.0)
Alkaline Phosphatase: 208 U/L — ABNORMAL HIGH (ref 38–126)
Anion gap: 19 — ABNORMAL HIGH (ref 5–15)
BUN: 61 mg/dL — ABNORMAL HIGH (ref 6–20)
CO2: 18 mmol/L — ABNORMAL LOW (ref 22–32)
Calcium: 9.5 mg/dL (ref 8.9–10.3)
Chloride: 95 mmol/L — ABNORMAL LOW (ref 98–111)
Creatinine, Ser: 10.84 mg/dL — ABNORMAL HIGH (ref 0.61–1.24)
GFR, Estimated: 6 mL/min — ABNORMAL LOW (ref >60)
Glucose, Bld: 108 mg/dL — ABNORMAL HIGH (ref 70–99)
Potassium: 4.8 mmol/L (ref 3.5–5.1)
Sodium: 132 mmol/L — ABNORMAL LOW (ref 135–145)
Total Bilirubin: 1 mg/dL (ref 0.0–1.2)
Total Protein: 7.2 g/dL (ref 6.5–8.1)

## 2023-11-01 LAB — BRAIN NATRIURETIC PEPTIDE: B Natriuretic Peptide: 850 pg/mL — ABNORMAL HIGH (ref 0.0–100.0)

## 2023-11-01 LAB — I-STAT CHEM 8, ED
BUN: 53 mg/dL — ABNORMAL HIGH (ref 6–20)
Calcium, Ion: 1.03 mmol/L — ABNORMAL LOW (ref 1.15–1.40)
Chloride: 99 mmol/L (ref 98–111)
Creatinine, Ser: 12.5 mg/dL — ABNORMAL HIGH (ref 0.61–1.24)
Glucose, Bld: 106 mg/dL — ABNORMAL HIGH (ref 70–99)
HCT: 33 % — ABNORMAL LOW (ref 39.0–52.0)
Hemoglobin: 11.2 g/dL — ABNORMAL LOW (ref 13.0–17.0)
Potassium: 4.6 mmol/L (ref 3.5–5.1)
Sodium: 130 mmol/L — ABNORMAL LOW (ref 135–145)
TCO2: 19 mmol/L — ABNORMAL LOW (ref 22–32)

## 2023-11-01 LAB — I-STAT CG4 LACTIC ACID, ED
Lactic Acid, Venous: 2.1 mmol/L (ref 0.5–1.9)
Lactic Acid, Venous: 1.9 mmol/L (ref 0.5–1.9)

## 2023-11-01 LAB — PROTIME-INR
INR: 1.5 — ABNORMAL HIGH (ref 0.8–1.2)
Prothrombin Time: 18.6 s — ABNORMAL HIGH (ref 11.4–15.2)

## 2023-11-01 MED ORDER — SEVELAMER CARBONATE 800 MG PO TABS
1600.0000 mg | ORAL_TABLET | Freq: Three times a day (TID) | ORAL | Status: DC
  Administered 2023-11-02 – 2023-11-12 (×17): 1600 mg via ORAL
  Filled 2023-11-01 (×20): qty 2

## 2023-11-01 MED ORDER — SODIUM CHLORIDE 0.9 % IV SOLN
2.0000 g | Freq: Once | INTRAVENOUS | Status: AC
  Administered 2023-11-01: 2 g via INTRAVENOUS
  Filled 2023-11-01: qty 12.5

## 2023-11-01 MED ORDER — CHLORHEXIDINE GLUCONATE CLOTH 2 % EX PADS
6.0000 | MEDICATED_PAD | Freq: Every day | CUTANEOUS | Status: DC
  Administered 2023-11-02: 6 via TOPICAL

## 2023-11-01 MED ORDER — CINACALCET HCL 30 MG PO TABS
30.0000 mg | ORAL_TABLET | ORAL | Status: DC
  Administered 2023-11-03 – 2023-11-04 (×2): 30 mg via ORAL
  Filled 2023-11-01 (×5): qty 1

## 2023-11-01 MED ORDER — METRONIDAZOLE 500 MG/100ML IV SOLN
500.0000 mg | Freq: Once | INTRAVENOUS | Status: AC
  Administered 2023-11-01: 500 mg via INTRAVENOUS
  Filled 2023-11-01: qty 100

## 2023-11-01 MED ORDER — VANCOMYCIN HCL 2000 MG/400ML IV SOLN
2000.0000 mg | Freq: Once | INTRAVENOUS | Status: AC
  Administered 2023-11-01: 2000 mg via INTRAVENOUS
  Filled 2023-11-01: qty 400

## 2023-11-01 MED ORDER — LIDOCAINE HCL (PF) 1 % IJ SOLN: 5.0000 mL | INTRAMUSCULAR | Status: DC | PRN

## 2023-11-01 MED ORDER — LIDOCAINE-PRILOCAINE 2.5-2.5 % EX CREA: 1.0000 | TOPICAL_CREAM | CUTANEOUS | Status: DC | PRN

## 2023-11-01 MED ORDER — ALTEPLASE 2 MG IJ SOLR: 2.0000 mg | Freq: Once | INTRAMUSCULAR | Status: DC | PRN

## 2023-11-01 MED ORDER — ANTICOAGULANT SODIUM CITRATE 4% (200MG/5ML) IV SOLN
5.0000 mL | Status: DC | PRN
  Filled 2023-11-01: qty 5

## 2023-11-01 MED ORDER — DIGOXIN 125 MCG PO TABS
0.1250 mg | ORAL_TABLET | ORAL | Status: DC
  Administered 2023-11-02 – 2023-11-11 (×5): 0.125 mg via ORAL
  Filled 2023-11-01 (×5): qty 1

## 2023-11-01 MED ORDER — APIXABAN 5 MG PO TABS
5.0000 mg | ORAL_TABLET | Freq: Two times a day (BID) | ORAL | Status: DC
  Administered 2023-11-01 – 2023-11-12 (×20): 5 mg via ORAL
  Filled 2023-11-01 (×21): qty 1

## 2023-11-01 MED ORDER — LEVOTHYROXINE SODIUM 25 MCG PO TABS
25.0000 ug | ORAL_TABLET | Freq: Every day | ORAL | Status: DC
  Administered 2023-11-02 – 2023-11-12 (×8): 25 ug via ORAL
  Filled 2023-11-01 (×8): qty 1

## 2023-11-01 MED ORDER — PENTAFLUOROPROP-TETRAFLUOROETH EX AERO: 1.0000 | INHALATION_SPRAY | CUTANEOUS | Status: DC | PRN

## 2023-11-01 MED ORDER — MIDODRINE HCL 5 MG PO TABS
30.0000 mg | ORAL_TABLET | Freq: Three times a day (TID) | ORAL | Status: DC
  Administered 2023-11-02 – 2023-11-06 (×5): 30 mg via ORAL
  Filled 2023-11-01 (×17): qty 6

## 2023-11-01 MED ORDER — VENLAFAXINE HCL ER 37.5 MG PO CP24
37.5000 mg | ORAL_CAPSULE | Freq: Every day | ORAL | Status: DC
  Administered 2023-11-02 – 2023-11-12 (×11): 37.5 mg via ORAL
  Filled 2023-11-01 (×11): qty 1

## 2023-11-01 MED ORDER — OXYCODONE HCL 5 MG PO TABS: 10.0000 mg | ORAL_TABLET | Freq: Four times a day (QID) | ORAL | Status: DC | PRN

## 2023-11-01 MED ORDER — PREGABALIN 25 MG PO CAPS
50.0000 mg | ORAL_CAPSULE | Freq: Every day | ORAL | Status: DC
  Administered 2023-11-02 – 2023-11-12 (×11): 50 mg via ORAL
  Filled 2023-11-01 (×11): qty 2

## 2023-11-01 MED ORDER — VANCOMYCIN HCL IN DEXTROSE 1-5 GM/200ML-% IV SOLN: 1000.0000 mg | Freq: Once | INTRAVENOUS | Status: DC

## 2023-11-01 MED ORDER — OXYCODONE HCL 5 MG PO TABS
10.0000 mg | ORAL_TABLET | Freq: Four times a day (QID) | ORAL | Status: DC | PRN
  Administered 2023-11-01 – 2023-11-12 (×21): 10 mg via ORAL
  Filled 2023-11-01 (×21): qty 2

## 2023-11-01 NOTE — Telephone Encounter (Signed)
 Copied from CRM [id number]. Topic: Clinical - Medical Advice >> Nov 01, 2023  9:05 AM Philippa Chester F wrote: Reason for CRM:   Patient mom, [name], called in to inform the office that the patient is currently being seen at [facility].   Patient is being seen for extreme edema in lower extremities; shortness of breath. Patient has his Bipac with him so he doesn't have to get a breathing tube put in.   Patient just wanted to ensure that the team was aware aware of him being there so they can check in on him and ensure he is being properly taken care of. Patient reported his concerns being ignored during the last visit (for example not using his bipac machine previously and forcing him to utilize a breathing tube. )  Callback Number:   [phone] or [phone] Phillip Young)

## 2023-11-01 NOTE — ED Notes (Signed)
 Patient transported to dialysis

## 2023-11-01 NOTE — Progress Notes (Signed)
 New Admission Note:   Arrival Method: Arrived from Hemodialysis unit via stretcher Mental Orientation: Alert and oriented x4 Telemetry: Box #14-Atrial Fib Assessment: Completed Skin: See LDA IV: NSL-Rt FA Pain: 7/10 Tubes: N/A Safety Measures: Safety Fall Prevention Plan has been discussed.  Admission: Completed Orientation: Patient has been oriented to the room, unit and staff.  Family: None at bedside  Orders have been reviewed and implemented. Will continue to monitor the patient. Call light has been placed within reach and bed alarm has been activated.   Jonah Nestle [employer], RN-BC Phone number: [phone]

## 2023-11-01 NOTE — Progress Notes (Signed)
   11/01/23 2130  Vitals  Temp (!) 97.5 F (36.4 C)  Temp Source Axillary  BP (!) 89/72  MAP (mmHg) 79  BP Location Right Arm  BP Method Automatic  Patient Position (if appropriate) Lying  Pulse Rate 69  Pulse Rate Source Monitor  ECG Heart Rate (!) 106  Resp 17  Oxygen Therapy  SpO2 100 %  O2 Device Nasal Cannula  O2 Flow Rate (L/min) 6 L/min  During Treatment Monitoring  Blood Flow Rate (mL/min) 0 mL/min  Arterial Pressure (mmHg) -26.26 mmHg  Venous Pressure (mmHg) 56.56 mmHg  TMP (mmHg) 12.73 mmHg  Ultrafiltration Rate (mL/min) 1557 mL/min  Dialysate Flow Rate (mL/min) 299 ml/min  Dialysate Potassium Concentration 2  Dialysate Calcium Concentration 2.5  Duration of HD Treatment -hour(s) 4 hour(s)  Cumulative Fluid Removed (mL) per Treatment  5500.21  Intra-Hemodialysis Comments Tx completed  Post Treatment  Dialyzer Clearance Lightly streaked  Liters Processed 87  Fluid Removed (mL) 5500 mL  Tolerated HD Treatment Yes  Hemodialysis Catheter Right Internal jugular Double lumen Permanent (Tunneled)  Placement Date/Time: 05/30/23 0856   Serial / Lot #: [id number]  Expiration Date: 09/23/27  Time Out: Correct patient;Correct site;Correct procedure  Maximum sterile barrier precautions: Hand hygiene;Cap;Mask;Sterile gown;Sterile gloves;Large sterile s...  Site Condition No complications  Blue Lumen Status Flushed  Red Lumen Status Flushed  Catheter fill solution 4% Sodium Citrate  Catheter fill volume (Arterial) 2 cc  Catheter fill volume (Venous) 2  Dressing Type Transparent  Dressing Status Antimicrobial disc/dressing in place  Interventions New dressing  Drainage Description None  Dressing Change Due 11/07/23  Post treatment catheter status Capped and Clamped   10 mg po oxycodone oral post dialysis.

## 2023-11-01 NOTE — H&P (Cosign Needed Addendum)
 Date: 11/01/2023               Patient Name:  Phillip Young MRN: [id number]  DOB: [birth year] Age / Sex: 38 y.o., male   PCP: [name], DO         Medical Service: Internal Medicine Teaching Service         Attending Physician: Dr. Mayford Knife, Dorene Ar, MD      First Contact: Dr. Monna Fam, MD Pager [phone]    Second Contact: Dr. Rocky Morel, DO          After Hours (After 5p/  First Contact Pager: [phone]  weekends / holidays): Second Contact Pager: [phone]   SUBJECTIVE   Chief Complaint: dyspnea  History of Present Illness:  Phillip Young is a 38 yo male with chronic hypoxic respiratory failure on 2-4L home O2, HFrEF, Afib on Eliquis, chronic pain, ESRD on HD who presented to the ED with shortness of breath. The patient states he started feeling somewhat short of breath last week. He has felt congested but denies other symptoms. He then missed HD yesterday due to bad weather. He otherwise denies missing other HD sessions. His feet and thighs have felt swollen and painful and he has had extreme difficulty walking. He has needed to turn his home O2 up to 6L and called EMS today due to dyspnea.    Patient feels he has gained a significant amount of weight in the last week, unsure of the exact amount. Significant swelling particularly in his legs and inner thighs. He is concerned about cellulitis on his upper inner thighs due to the edema. His symptoms feel very similar to previous admissions for volume overload related to missing HD sessions, denies any new or changed symptoms. He has had difficulty affording his medications recently and has missed many doses, though has been able to continue taking his Eliquis and oxycodone. Denies fever, chest pain, palpitations, dizziness, bleeding, changes with bowel movements or urination.    ED Course: Afeb, hypotensive with MAP ranging 70-100s On 6L [place] from baseline 2-4L No leukocytosis, Hgb stable Cr 12.5 Respiratory panel  negative CT femur, pelvis: unchanged from previous, negative for abscess or deep infection Vancomycin, cefepime, metronidazole started out of concern for cellulitis  Past Medical History: Past Medical History:  Diagnosis Date   Acute on chronic respiratory failure with hypoxia ([name]) 04/21/2021   Acute on chronic systolic (congestive) heart failure ([name]) 02/26/2020   Amiodarone toxicity    Anemia    Atrial flutter ([name])    Biventricular congestive heart failure ([name])    Chronic hypoxemic respiratory failure ([name])    Class 3 severe obesity due to excess calories with serious comorbidity and body mass index (BMI) of 50.0 to 59.9 in adult [facility]) 02/26/2020   Essential hypertension 02/26/2020   GERD without esophagitis 02/26/2020   Hidradenitis suppurativa 02/26/2020   NICM (nonischemic cardiomyopathy) ([name])    Obesity hypoventilation syndrome ([name])    OSA (obstructive sleep apnea)    PAF (paroxysmal atrial fibrillation) ([name])    Pneumonia    Prediabetes 02/26/2020   Meds:  Current Outpatient Medications  Medication Instructions   acetaminophen (TYLENOL) 1,000 mg, Oral, Every 6 hours   apixaban (ELIQUIS) 5 mg, Oral, 2 times daily   cinacalcet (SENSIPAR) 30 mg, Oral, Every Dialysis   clindamycin (CLINDAGEL) 1 % gel Topical, 2 times daily   D3-1000 500 Units, Daily   Darbepoetin Alfa (ARANESP) 150 MCG/0.3ML SOSY injection To be given during dialysis by [facility] per Nephrology. Not to be given to patient at SNF.   digoxin (LANOXIN) 0.125 mg, Oral, 3 times weekly, Monday, Wednesday, Friday (dialysis days)   hydrocerin (EUCERIN) CREA 1 Application, Topical, Daily   levothyroxine (SYNTHROID) 25 mcg, Oral, Daily   midodrine (PROAMATINE) 30 mg, Oral, 3 times daily with meals   naloxone (NARCAN) 2 mg, Subcutaneous, As needed   Oxycodone HCl 10 mg, Oral, Every 6 hours PRN   pregabalin (LYRICA) 50 mg, Oral, Daily, Take one capsule daily.   sevelamer carbonate (RENVELA) 1,600  mg, Oral, 3 times daily with meals   tirzepatide (ZEPBOUND) 2.5 mg, Subcutaneous, Weekly   triamcinolone ointment (KENALOG) 0.1 % Topical, 2 times daily   venlafaxine XR (EFFEXOR XR) 37.5 mg, Oral, Daily    Past Surgical History:  Procedure Laterality Date   ABSCESS DRAINAGE     AV FISTULA PLACEMENT Left 08/21/2021   Procedure: LEFT ARM ARTERIOVENOUS (AV) FISTULA.;  Surgeon: Nada Libman, MD;  Location: [facility] OR;  Service: Vascular;  Laterality: Left;   CARDIAC CATHETERIZATION     CARDIOVERSION N/A 10/09/2021   Procedure: CARDIOVERSION;  Surgeon: Laurey Morale, MD;  Location: [facility] ENDOSCOPY;  Service: Cardiovascular;  Laterality: N/A;   CARDIOVERSION N/A 05/28/2022   Procedure: CARDIOVERSION;  Surgeon: Laurey Morale, MD;  Location: [facility] ENDOSCOPY;  Service: Cardiovascular;  Laterality: N/A;   CARDIOVERSION N/A 06/07/2022   Procedure: CARDIOVERSION;  Surgeon: Laurey Morale, MD;  Location: [facility] ENDOSCOPY;  Service: Cardiovascular;  Laterality: N/A;   IR FLUORO GUIDE CV LINE RIGHT  03/10/2021   IR FLUORO GUIDE CV LINE RIGHT  04/22/2021   IR FLUORO GUIDE CV LINE RIGHT  08/20/2021   IR FLUORO GUIDE CV LINE RIGHT  03/01/2023   IR FLUORO GUIDE CV LINE RIGHT  03/09/2023   IR FLUORO GUIDE CV LINE RIGHT  05/30/2023   IR PTA VENOUS EXCEPT DIALYSIS CIRCUIT  03/09/2023   IR REMOVAL TUN CV CATH W/O FL  05/26/2023   IR REMOVE CV FIBRIN SHEATH  03/09/2023   IR US GUIDE VASC ACCESS RIGHT  03/10/2021   IR US GUIDE VASC ACCESS RIGHT  04/22/2021   IR US GUIDE VASC ACCESS RIGHT  05/30/2023   RIGHT HEART CATH N/A 03/06/2021   Procedure: RIGHT HEART CATH;  Surgeon: Dolores Patty, MD;  Location: [facility] INVASIVE CV LAB;  Service: Cardiovascular;  Laterality: N/A;   RIGHT/LEFT HEART CATH AND CORONARY ANGIOGRAPHY N/A 03/04/2020   Procedure: RIGHT/LEFT HEART CATH AND CORONARY ANGIOGRAPHY;  Surgeon: Laurey Morale, MD;  Location: [facility] INVASIVE CV LAB;  Service: Cardiovascular;  Laterality: N/A;   TEE WITHOUT  CARDIOVERSION N/A 05/05/2021   Procedure: TRANSESOPHAGEAL ECHOCARDIOGRAM (TEE);  Surgeon: Laurey Morale, MD;  Location: [facility] ENDOSCOPY;  Service: Cardiovascular;  Laterality: N/A;   TEMPORARY DIALYSIS CATHETER  03/06/2021   Procedure: TEMPORARY DIALYSIS CATHETER;  Surgeon: Dolores Patty, MD;  Location: [facility] INVASIVE CV LAB;  Service: Cardiovascular;;   Social:  Living Situation: with mother PCP: [name], DO Tobacco: none Alcohol: none Drugs: none  Family History: noncontributory  Allergies: Allergies as of 11/01/2023 - Review Complete 11/01/2023  Allergen Reaction Noted   Amiodarone Other (See Comments) 06/01/2022   Coreg [carvedilol] Shortness Of Breath and Diarrhea 07/07/2020   Heparin Other (See Comments) 03/05/2021   Metoprolol Other (See Comments) 08/04/2022   Amoxicillin Other (See Comments) 01/26/2023   Other Swelling and Other (See Comments) 01/26/2023   Tramadol  09/22/2023   Review of Systems: A complete ROS  was negative except as per HPI.   OBJECTIVE:   Physical Exam: Blood pressure 90/62, pulse 87, temperature 97.6 F (36.4 C), resp. rate (!) 29, height 6' (1.829 m), weight (!) 198.2 kg, SpO2 100%.  Constitutional: chronically ill-appearing, dyspneic, fatigued, in no acute distress HENT: normocephalic atraumatic, mucous membranes moist Cardiovascular: irregularly irregular Pulmonary/Chest: increased work of breathing on 6L [place], decreased air movement bilaterally Abdominal: soft, non-tender, non-distended MSK: edematous inner thighs, no significant skin changes. Superficial ulceration of buttocks Neurological: alert & oriented x 3 Skin: warm and dry  Labs: CBC    Component Value Date/Time   WBC 8.5 11/01/2023 1004   RBC 3.17 (L) 11/01/2023 1004   HGB 11.2 (L) 11/01/2023 1020   HGB 11.7 (L) 06/21/2023 1606   HCT 33.0 (L) 11/01/2023 1020   HCT 36.4 (L) 06/21/2023 1606   PLT 178 11/01/2023 1004   PLT 230 06/21/2023 1606   MCV 106.6 (H)  11/01/2023 1004   MCV 97 06/21/2023 1606   MCH 33.1 11/01/2023 1004   MCHC 31.1 11/01/2023 1004   RDW 16.8 (H) 11/01/2023 1004   RDW 14.3 06/21/2023 1606   LYMPHSABS 0.9 11/01/2023 1004   MONOABS 0.7 11/01/2023 1004   EOSABS 1.2 (H) 11/01/2023 1004   BASOSABS 0.1 11/01/2023 1004    CMP     Component Value Date/Time   NA 130 (L) 11/01/2023 1020   NA 134 06/21/2023 1606   K 4.6 11/01/2023 1020   CL 99 11/01/2023 1020   CO2 18 (L) 11/01/2023 1004   GLUCOSE 106 (H) 11/01/2023 1020   BUN 53 (H) 11/01/2023 1020   BUN 41 (H) 06/21/2023 1606   CREATININE 12.50 (H) 11/01/2023 1020   CREATININE 9.17 (H) 08/26/2021 1014   CALCIUM 9.5 11/01/2023 1004   PROT 7.2 11/01/2023 1004   PROT 7.5 09/15/2021 1147   ALBUMIN 3.1 (L) 11/01/2023 1004   ALBUMIN 4.3 09/15/2021 1147   AST 19 11/01/2023 1004   ALT 16 11/01/2023 1004   ALKPHOS 208 (H) 11/01/2023 1004   BILITOT 1.0 11/01/2023 1004   BILITOT 1.1 09/15/2021 1147   GFRNONAA 6 (L) 11/01/2023 1004   GFRAA >60 03/27/2020 1055    Imaging: CT FEMUR RIGHT WO CONTRAST Result Date: 11/01/2023 CLINICAL DATA:  Cellulitis. Dialysis patient Monday Wednesday Friday. Missed yesterday. Vomited last night. History of chronic sacral wounds. EXAM: CT OF THE LOWER RIGHT EXTREMITY WITHOUT CONTRAST TECHNIQUE: Multidetector CT imaging of the right lower extremity was performed according to the standard protocol. RADIATION DOSE REDUCTION: This exam was performed according to the departmental dose-optimization program which includes automated exposure control, adjustment of the mA and/or kV according to patient size and/or use of iterative reconstruction technique. COMPARISON:  CT pelvis 07/01/2023 FINDINGS: Bones/Joint/Cartilage There is decreased bony detail due to patient large body habitus and photon starvation artifact. Mild lateral right knee patellofemoral joint space narrowing. Moderate superior right femoroacetabular joint space narrowing. No acute fracture is  seen. No cortical erosion is identified. Ligaments Suboptimally assessed by CT. Muscles and Tendons No gross abnormality is seen in size or density of the regional musculature. No gross tendon tear is seen. Soft tissues Large body habitus. Diffuse moderate subcutaneous fat edema and swelling throughout the right thigh and visualized portion of the medial left thigh. There is moderate skin thickening diffusely. No definite walled-off abscess is seen, within the limitations of the photon starvation and artifact. No knee joint effusion is seen. There are multiple prominent right inguinal lymph nodes, measuring up to  10 mm in short axis. These are presumably reactive. IMPRESSION: 1. Diffuse moderate subcutaneous fat edema and swelling throughout the right thigh and visualized portion of the medial left thigh. There is moderate skin thickening diffusely. No definite walled-off abscess is seen, within the limitations of large body habitus and associated photon starvation/artifact. 2. No acute fracture is seen. No cortical erosion is identified. 3. Multiple prominent right inguinal lymph nodes, measuring up to 10 mm in short axis. These are presumably reactive. Electronically Signed   By: Neita Garnet M.D.   On: 11/01/2023 16:56   CT PELVIS WO CONTRAST Result Date: 11/01/2023 CLINICAL DATA:  Vomiting.  Cellulitis. EXAM: CT PELVIS WITHOUT CONTRAST TECHNIQUE: Multidetector CT imaging of the pelvis was performed following the standard protocol without intravenous contrast. RADIATION DOSE REDUCTION: This exam was performed according to the departmental dose-optimization program which includes automated exposure control, adjustment of the mA and/or kV according to patient size and/or use of iterative reconstruction technique. COMPARISON:  July 01, 2023. FINDINGS: Urinary Tract:  Urinary bladder is decompressed. Bowel:  Unremarkable visualized pelvic bowel loops. Vascular/Lymphatic: No pathologically enlarged lymph nodes.  No significant vascular abnormality seen. Reproductive:  Prostate is unremarkable. Other: No ascites or hernia is noted. Stable irregular densities are seen in the subcutaneous tissues posterior to the sacrum which may represent scarring, or possibly cellulitis. Definite fluid collection or abscess is not identified. Musculoskeletal: No suspicious bone lesions identified. IMPRESSION: Stable irregular densities are noted in the subcutaneous tissues posterior to the sacrum which may represent cellulitis or scarring from prior inflammation or infection. No definite fluid collection or abscess is noted. Electronically Signed   By: Lupita Raider M.D.   On: 11/01/2023 13:19   DG Chest Port 1 View Result Date: 11/01/2023 CLINICAL DATA:  Shortness of breath EXAM: PORTABLE CHEST 1 VIEW COMPARISON:  10/10/2023 FINDINGS: Right dialysis catheter remains in place, unchanged. Cardiomegaly. No confluent airspace opacities, effusions or edema. No acute bony abnormality. IMPRESSION: Cardiomegaly.  No active disease. Electronically Signed   By: Charlett Nose M.D.   On: 11/01/2023 10:23   DG Chest Portable 1 View Result Date: 10/10/2023 CLINICAL DATA:  Shortness of breath EXAM: PORTABLE CHEST 1 VIEW COMPARISON:  09/21/2023 FINDINGS: Right dialysis catheter remains in place, unchanged. Cardiomegaly with vascular congestion and probable interstitial edema. No effusions. No acute bony abnormality. IMPRESSION: Cardiomegaly with vascular congestion and probable mild interstitial edema. Electronically Signed   By: Charlett Nose M.D.   On: 10/10/2023 18:05    ASSESSMENT & PLAN:   Aquan Kope is a 38 y.o. person living with a history of ESRD, HFrEF, chronic hypoxemic respiratory failure who presented with dyspnea and edema and admitted for acute on chronic hypoxemic respiratory failure on hospital day 0   Acute on chronic hypoxic respiratory failure  Hx presumed amiodarone toxicity HFrEF, LVEF <20% Symptoms of dyspnea with  increased oxygen requirement and weight gain in the setting of missed HD session. Most likely volume overloaded leading to exacerbation of his chronic hypoxic respiratory failure. Low suspicion for infectious etiology as patient has been afebrile with no leukocytosis, negative respiratory panel and no other significant infectious symptoms.  - HD per nephrology, appreciate assistance. HD today - Wean O2 as tolerated for SpO2 goal >90%   ESRD on HD Dyspnea and volume overload are most likely due to missing HD yesterday and possible worsening of his significant comorbidities.  - Nephrology consulted, appreciate assistance with HD needs. Receiving HD today - Trend BMP  Anemia of chronic disease Hgb stable at 11.2. No signs of blood loss. Continue to monitor   Paroxysmal atrial fibrillation - Telemetry monitoring - Continue home Eliquis 5mg  BID, Digoxin 0.125mg  MWF   Chronic somatic and neuropathic pain Chronic opiate therapy It has been attempted to switch away from chronic oxycodone therapy particularly with history of ESRD, however patient reports dilaudid has been less effective and requested continued oxycodone.  - Continue venlafaxine 37.5mg  daily, lyrica 50mg  daily, oxycodone 10mg  q6h prn.    Hypothyroidism Continue home levothyroxine   Depression Continue home venlafaxine 37.5mg ,   Diet: Renal VTE: DOAC Code: Full  Prior to Admission Living Arrangement: home Anticipated Discharge Location: pending PT/OT eval Barriers to Discharge: pending medical stability  Signed: Monna Fam, MD Internal Medicine Resident PGY-1  11/01/2023, 5:08 PM

## 2023-11-01 NOTE — Plan of Care (Signed)
   Problem: Education: Goal: Knowledge of disease and its progression will improve Outcome: Progressing Goal: Individualized Educational Video(s) Outcome: Progressing   Problem: Fluid Volume: Goal: Compliance with measures to maintain balanced fluid volume will improve Outcome: Progressing   Problem: Health Behavior/Discharge Planning: Goal: Ability to manage health-related needs will improve Outcome: Progressing   Problem: Nutritional: Goal: Ability to make healthy dietary choices will improve Outcome: Progressing   Problem: Clinical Measurements: Goal: Complications related to the disease process, condition or treatment will be avoided or minimized Outcome: Progressing

## 2023-11-01 NOTE — ED Provider Notes (Signed)
 [place] EMERGENCY DEPARTMENT AT [facility] Provider Note   CSN: [id number] Arrival date & time: 11/01/23  4098     History  Chief Complaint  Patient presents with   Shortness of Breath    Phillip Young is a 38 y.o. male.  HPI 38 year old male presents with a chief complaint of shortness of breath.  He was brought in by EMS.  He has chronic respiratory failure on oxygen, CHF, atrial fibrillation/flutter, ESRD on dialysis, and other comorbidities.  He states that he has been having about 6 days of shortness of breath.  He does MWF dialysis and last 1 on Friday (today is Tuesday).  He missed yesterday due to some issues at his house with mud and he was unable to get up the stairs out of his apartment.  He states he has been having acute on chronic sacral pain from a prior wound/ulcer.  He is also been having about 6 days of right proximal thigh pain where he had a previous wound as well but he feels like it swollen and perhaps with cellulitis.  He denies any fevers.  He has a chronic but unchanged cough.  No chest pain. Chronic bilateral leg pain from neuropathy, does not feel they are more swollen than typical.  Home Medications Prior to Admission medications   Medication Sig Start Date End Date Taking? Authorizing Provider  acetaminophen (TYLENOL) 500 MG tablet Take 2 tablets (1,000 mg total) by mouth every 6 (six) hours. 09/06/23   Champ Mungo, DO  apixaban (ELIQUIS) 5 MG TABS tablet Take 1 tablet (5 mg total) by mouth 2 (two) times daily. 06/03/23   [name], DO  Cholecalciferol (D3-1000) 25 MCG (1000 UT) tablet Take 500 Units by mouth daily.    [provider]  cinacalcet (SENSIPAR) 30 MG tablet Take 1 tablet (30 mg total) by mouth every dialysis. Patient taking differently: Take 30 mg by mouth every Monday, Wednesday, and Friday. 09/12/23   Lovie Macadamia, MD  clindamycin (CLINDAGEL) 1 % gel Apply topically 2 (two) times daily. 10/06/23   [name], DO  Darbepoetin Alfa (ARANESP) 150 MCG/0.3ML SOSY injection To be given during dialysis by [facility] per Nephrology. Not to be given to patient at SNF. 09/06/23   Lovie Macadamia, MD  digoxin (LANOXIN) 0.125 MG tablet Take 1 tablet (0.125 mg total) by mouth 3 (three) times a week. Monday, Wednesday, Friday (dialysis days) 07/22/23   Rocky Morel, DO  hydrocerin (EUCERIN) CREA Apply 1 Application topically daily. 10/22/23 11/21/23  Jeral Pinch, DO  levothyroxine (SYNTHROID) 25 MCG tablet Take 1 tablet (25 mcg total) by mouth daily at 6 (six) AM. 07/21/23   Rocky Morel, DO  midodrine (PROAMATINE) 10 MG tablet Take 3 tablets (30 mg total) by mouth 3 (three) times daily with meals. 06/20/23   Setzer, Lynnell Jude, PA-C  naloxone [facility]) 2 MG/2ML injection Inject 2 mg into the skin as needed.    [provider]  Oxycodone HCl 10 MG TABS Take 1 tablet (10 mg total) by mouth every 6 (six) hours as needed. 10/28/23   [name], DO  pregabalin (LYRICA) 50 MG capsule Take 1 capsule (50 mg total) by mouth daily. Take one capsule daily. 07/25/23   Champ Mungo, DO  sevelamer carbonate (RENVELA) 800 MG tablet Take 2 tablets (1,600 mg total) by mouth 3 (three) times daily with meals. Patient not taking: Reported on 10/11/2023 10/06/23   [name], DO  tirzepatide [facility]) 2.5 MG/0.5ML injection  vial Inject 2.5 mg into the skin once a week. Patient not taking: Reported on 10/11/2023 08/25/23   Lovie Macadamia, MD  triamcinolone ointment (KENALOG) 0.1 % Apply topically 2 (two) times daily. 09/12/23   Lovie Macadamia, MD  venlafaxine XR (EFFEXOR XR) 37.5 MG 24 hr capsule Take 1 capsule (37.5 mg total) by mouth daily. 07/07/23 07/06/24  [name], DO      Allergies    Amiodarone, Coreg [carvedilol], Heparin, Metoprolol, Amoxicillin, Other, and Tramadol    Review of Systems   Review of Systems  Constitutional:  Negative for fever.   Respiratory:  Positive for cough and shortness of breath.   Cardiovascular:  Positive for leg swelling. Negative for chest pain.  Gastrointestinal:  Negative for abdominal pain.  Musculoskeletal:  Positive for myalgias.    Physical Exam Updated Vital Signs BP (!) 108/92   Pulse (!) 103   Temp (!) 97 F (36.1 C) (Oral)   Resp (!) 24   Ht 6' (1.829 m)   Wt (!) 198.2 kg   SpO2 100%   BMI 59.27 kg/m  Physical Exam Vitals and nursing note reviewed.  Constitutional:      Appearance: He is well-developed. He is obese.  HENT:     Head: Normocephalic and atraumatic.  Cardiovascular:     Rate and Rhythm: Tachycardia present. Rhythm irregular.     Heart sounds: Normal heart sounds.  Pulmonary:     Effort: Pulmonary effort is normal.     Breath sounds: Normal breath sounds. No wheezing or rales.  Abdominal:     Palpations: Abdomen is soft.     Tenderness: There is no abdominal tenderness.  Musculoskeletal:     Right lower leg: Edema present.     Left lower leg: Edema present.     Comments: No obvious cellulitis to bilateral calves. There is tenderness and chronic superficial skin changes to right medal and proximal thigh. No erythema or fluctuance appreciated  Skin:    General: Skin is warm and dry.     Comments: He has a chronic wound (superficial) and scar to sacrum. No drainage. No cellulitis appreciated.  Neurological:     Mental Status: He is alert.     ED Results / Procedures / Treatments   Labs (all labs ordered are listed, but only abnormal results are displayed) Labs Reviewed  COMPREHENSIVE METABOLIC PANEL WITH GFR - Abnormal; Notable for the following components:      Result Value   Sodium 132 (*)    Chloride 95 (*)    CO2 18 (*)    Glucose, Bld 108 (*)    BUN 61 (*)    Creatinine, Ser 10.84 (*)    Albumin 3.1 (*)    Alkaline Phosphatase 208 (*)    GFR, Estimated 6 (*)    Anion gap 19 (*)    All other components within normal limits  CBC WITH  DIFFERENTIAL/PLATELET - Abnormal; Notable for the following components:   RBC 3.17 (*)    Hemoglobin 10.5 (*)    HCT 33.8 (*)    MCV 106.6 (*)    RDW 16.8 (*)    Eosinophils Absolute 1.2 (*)    All other components within normal limits  PROTIME-INR - Abnormal; Notable for the following components:   Prothrombin Time 18.6 (*)    INR 1.5 (*)    All other components within normal limits  BRAIN NATRIURETIC PEPTIDE - Abnormal; Notable for the following components:   B Natriuretic Peptide 850.0 (*)  All other components within normal limits  I-STAT CG4 LACTIC ACID, ED - Abnormal; Notable for the following components:   Lactic Acid, Venous 2.1 (*)    All other components within normal limits  I-STAT CHEM 8, ED - Abnormal; Notable for the following components:   Sodium 130 (*)    BUN 53 (*)    Creatinine, Ser 12.50 (*)    Glucose, Bld 106 (*)    Calcium, Ion 1.03 (*)    TCO2 19 (*)    Hemoglobin 11.2 (*)    HCT 33.0 (*)    All other components within normal limits  TROPONIN I (HIGH SENSITIVITY) - Abnormal; Notable for the following components:   Troponin I (High Sensitivity) 54 (*)    All other components within normal limits  TROPONIN I (HIGH SENSITIVITY) - Abnormal; Notable for the following components:   Troponin I (High Sensitivity) 54 (*)    All other components within normal limits  RESP PANEL BY RT-PCR (RSV, FLU A&B, COVID)  RVPGX2  CULTURE, BLOOD (ROUTINE X 2)  CULTURE, BLOOD (ROUTINE X 2)  I-STAT CG4 LACTIC ACID, ED    EKG EKG Interpretation Date/Time:  Tuesday November 01 2023 09:20:21 EDT Ventricular Rate:  118 PR Interval:    QRS Duration:  118 QT Interval:  323 QTC Calculation: 433 R Axis:   79  Text Interpretation: Atrial fibrillation Right bundle branch block Low voltage, precordial leads Confirmed by Pricilla Loveless [phone]) on 11/01/2023 9:42:52 AM  Radiology CT PELVIS WO CONTRAST Result Date: 11/01/2023 CLINICAL DATA:  Vomiting.  Cellulitis. EXAM: CT PELVIS  WITHOUT CONTRAST TECHNIQUE: Multidetector CT imaging of the pelvis was performed following the standard protocol without intravenous contrast. RADIATION DOSE REDUCTION: This exam was performed according to the departmental dose-optimization program which includes automated exposure control, adjustment of the mA and/or kV according to patient size and/or use of iterative reconstruction technique. COMPARISON:  July 01, 2023. FINDINGS: Urinary Tract:  Urinary bladder is decompressed. Bowel:  Unremarkable visualized pelvic bowel loops. Vascular/Lymphatic: No pathologically enlarged lymph nodes. No significant vascular abnormality seen. Reproductive:  Prostate is unremarkable. Other: No ascites or hernia is noted. Stable irregular densities are seen in the subcutaneous tissues posterior to the sacrum which may represent scarring, or possibly cellulitis. Definite fluid collection or abscess is not identified. Musculoskeletal: No suspicious bone lesions identified. IMPRESSION: Stable irregular densities are noted in the subcutaneous tissues posterior to the sacrum which may represent cellulitis or scarring from prior inflammation or infection. No definite fluid collection or abscess is noted. Electronically Signed   By: Lupita Raider M.D.   On: 11/01/2023 13:19   DG Chest Port 1 View Result Date: 11/01/2023 CLINICAL DATA:  Shortness of breath EXAM: PORTABLE CHEST 1 VIEW COMPARISON:  10/10/2023 FINDINGS: Right dialysis catheter remains in place, unchanged. Cardiomegaly. No confluent airspace opacities, effusions or edema. No acute bony abnormality. IMPRESSION: Cardiomegaly.  No active disease. Electronically Signed   By: Charlett Nose M.D.   On: 11/01/2023 10:23    Procedures Procedures    Medications Ordered in ED Medications  metroNIDAZOLE (FLAGYL) IVPB 500 mg (500 mg Intravenous New Bag/Given 11/01/23 1447)  vancomycin (VANCOREADY) IVPB 2000 mg/400 mL (2,000 mg Intravenous New Bag/Given 11/01/23 1448)   ceFEPIme (MAXIPIME) 2 g in sodium chloride 0.9 % 100 mL IVPB (0 g Intravenous Stopped 11/01/23 1445)    ED Course/ Medical Decision Making/ A&P  Medical Decision Making Amount and/or Complexity of Data Reviewed External Data Reviewed: notes. Labs: ordered.    Details: Lactate 2.1. normal WBC Radiology: ordered and independent interpretation performed.    Details: No CH ECG/medicine tests: ordered and independent interpretation performed.    Details: Afib  Risk Prescription drug management. Decision regarding hospitalization.   Patient presents with shortness of breath.  He does have a mild lactic acidosis.  He is concern for cellulitis though I do not see any obvious cellulitis though he has some thigh pain on the skin.  CT has been obtained.  I will also CT of his pelvis given history of recurrent abscesses near the pilonidal area.  Overall he was treated broadly with antibiotics as he has had some soft pressures but with tolerable maps above 65.  Ultimately he will need admission.  I have consulted nephrology as he will ultimately need dialysis and I have also consulted the internal medicine teaching service who will admit.        Final Clinical Impression(s) / ED Diagnoses Final diagnoses:  Lactic acidosis    Rx / DC Orders ED Discharge Orders     None         Pricilla Loveless, MD 11/01/23 1526

## 2023-11-01 NOTE — Plan of Care (Signed)
  Problem: Education: Goal: Knowledge of disease and its progression will improve Outcome: Progressing   Problem: Nutritional: Goal: Ability to make healthy dietary choices will improve Outcome: Progressing   Problem: Education: Goal: Knowledge of General Education information will improve Description: Including pain rating scale, medication(s)/side effects and non-pharmacologic comfort measures Outcome: Progressing   Problem: Nutrition: Goal: Adequate nutrition will be maintained Outcome: Progressing

## 2023-11-01 NOTE — ED Notes (Signed)
 Patient transported to CT

## 2023-11-01 NOTE — ED Triage Notes (Signed)
 Pt presents to ED via EMS from home. Dialysis pt, MWF. Missed yesterday. Went Friday. Increasingly SOB for the last four days. Also reports vomiting 400 ml last night. Clear lung sounds per EMS. BLE pitting edema, ascites. Pt also wanting some assistance with rehab and resources at home to have better access without steps at home. 2-4L [place] at baseline, 5-6 when distressed.

## 2023-11-01 NOTE — Consult Note (Signed)
 [name] Renal Consultation Note    Indication for Consultation:  Management of ESRD/hemodialysis, anemia, hypertension/volume, and secondary hyperparathyroidism. PCP:  HPI: Phillip Young is a 38 y.o. male with ESRD, A-fib/flutter, HFrEF, morbid obesity who was admitted with dyspnea/chronic overload and recurrent sacral cellulitis.  Brought in via EMS this AM with c/o worsening dyspnea since discharge on 10/24/23. Did have outpatient HD on 4/2 and 4/4, although he cut his time on 4/4 - ran 3:49hr instead of 4:30hr prescribed time. Remains severely overloaded, roughly 30kg up from what we suspect his EDW is. Also with pain in sacral area - tells me he has a new infection there - on Vanc/metronidazole for this.  Seen in trauma bay - looks comfortable on [place] O2. Denies CP, abd pain, N/V/D. Chronic LE pain.  Dialyzes on MWF schedule at [facility], although is frequently admitted here. Has R sided TDC as his access - no recent issues.  Past Medical History:  Diagnosis Date   Acute on chronic respiratory failure with hypoxia ([name]) 04/21/2021   Acute on chronic systolic (congestive) heart failure ([name]) 02/26/2020   Amiodarone toxicity    Anemia    Atrial flutter ([name])    Biventricular congestive heart failure ([name])    Chronic hypoxemic respiratory failure ([name])    Class 3 severe obesity due to excess calories with serious comorbidity and body mass index (BMI) of 50.0 to 59.9 in adult [facility]) 02/26/2020   Essential hypertension 02/26/2020   GERD without esophagitis 02/26/2020   Hidradenitis suppurativa 02/26/2020   NICM (nonischemic cardiomyopathy) ([name])    Obesity hypoventilation syndrome ([name])    OSA (obstructive sleep apnea)    PAF (paroxysmal atrial fibrillation) ([name])    Pneumonia    Prediabetes 02/26/2020   Past Surgical History:  Procedure Laterality Date   ABSCESS DRAINAGE     AV FISTULA PLACEMENT Left 08/21/2021   Procedure: LEFT ARM ARTERIOVENOUS (AV) FISTULA.;   Surgeon: Nada Libman, MD;  Location: [facility] OR;  Service: Vascular;  Laterality: Left;   CARDIAC CATHETERIZATION     CARDIOVERSION N/A 10/09/2021   Procedure: CARDIOVERSION;  Surgeon: Laurey Morale, MD;  Location: [facility] ENDOSCOPY;  Service: Cardiovascular;  Laterality: N/A;   CARDIOVERSION N/A 05/28/2022   Procedure: CARDIOVERSION;  Surgeon: Laurey Morale, MD;  Location: [facility] ENDOSCOPY;  Service: Cardiovascular;  Laterality: N/A;   CARDIOVERSION N/A 06/07/2022   Procedure: CARDIOVERSION;  Surgeon: Laurey Morale, MD;  Location: [facility] ENDOSCOPY;  Service: Cardiovascular;  Laterality: N/A;   IR FLUORO GUIDE CV LINE RIGHT  03/10/2021   IR FLUORO GUIDE CV LINE RIGHT  04/22/2021   IR FLUORO GUIDE CV LINE RIGHT  08/20/2021   IR FLUORO GUIDE CV LINE RIGHT  03/01/2023   IR FLUORO GUIDE CV LINE RIGHT  03/09/2023   IR FLUORO GUIDE CV LINE RIGHT  05/30/2023   IR PTA VENOUS EXCEPT DIALYSIS CIRCUIT  03/09/2023   IR REMOVAL TUN CV CATH W/O FL  05/26/2023   IR REMOVE CV FIBRIN SHEATH  03/09/2023   IR US GUIDE VASC ACCESS RIGHT  03/10/2021   IR US GUIDE VASC ACCESS RIGHT  04/22/2021   IR US GUIDE VASC ACCESS RIGHT  05/30/2023   RIGHT HEART CATH N/A 03/06/2021   Procedure: RIGHT HEART CATH;  Surgeon: Dolores Patty, MD;  Location: [facility] INVASIVE CV LAB;  Service: Cardiovascular;  Laterality: N/A;   RIGHT/LEFT HEART CATH AND CORONARY ANGIOGRAPHY N/A 03/04/2020   Procedure: RIGHT/LEFT HEART CATH AND CORONARY ANGIOGRAPHY;  Surgeon: Shirlee Latch,  Eliot Ford, MD;  Location: [facility] INVASIVE CV LAB;  Service: Cardiovascular;  Laterality: N/A;   TEE WITHOUT CARDIOVERSION N/A 05/05/2021   Procedure: TRANSESOPHAGEAL ECHOCARDIOGRAM (TEE);  Surgeon: Laurey Morale, MD;  Location: [facility] ENDOSCOPY;  Service: Cardiovascular;  Laterality: N/A;   TEMPORARY DIALYSIS CATHETER  03/06/2021   Procedure: TEMPORARY DIALYSIS CATHETER;  Surgeon: Dolores Patty, MD;  Location: [facility] INVASIVE CV LAB;  Service: Cardiovascular;;   Family History   Problem Relation Age of Onset   Heart disease Mother    Hypertension Mother    Pulmonary Hypertension Mother    Drug abuse Father        died due to Heroin overdose   Social History:  reports that he quit smoking about 6 years ago. His smoking use included cigarettes. He has been exposed to tobacco smoke. He has quit using smokeless tobacco. He reports that he does not drink alcohol and does not use drugs.  ROS: As per HPI otherwise negative.  Physical Exam: Vitals:   11/01/23 1400 11/01/23 1445 11/01/23 1500 11/01/23 1530  BP: (!) 88/73  (!) 108/92 (!) 107/93  Pulse: 94  (!) 103   Resp: (!) 26  (!) 24   Temp:  (!) 97 F (36.1 C)    TempSrc:  Oral    SpO2: 96%  100%   Weight:      Height:         General: Chronically ill man, NAD. [place] O2 Head: Normocephalic, atraumatic, sclera non-icteric, mucus membranes are moist. Neck: Supple without lymphadenopathy/masses. JVD not elevated. Lungs: Clear bilaterally to auscultation without wheezes, rales, or rhonchi.  Heart: RRR with normal S1, S2. No murmurs, rubs, or gallops appreciated. Abdomen: Soft, non-tender, non-distended with normoactive bowel sounds.  Musculoskeletal:  Strength and tone appear normal for age. Lower extremities: 4+ BLE edema with tenderness Neuro: Alert and oriented X 3. Moves all extremities spontaneously. Psych:  Responds to questions appropriately with a normal affect. Dialysis Access: TDC in R chest  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, SRA positive   Metoprolol Other (See Comments)    near syncope   Amoxicillin Other (See Comments)    Was hospitalized    Other Swelling and Other (See Comments)    Steroids Fluid seeping out of legs    Tramadol    Prior to Admission medications    Medication Sig Start Date End Date Taking? Authorizing Provider  acetaminophen (TYLENOL) 500 MG tablet Take 2 tablets (1,000 mg total) by mouth every 6 (six) hours. 09/06/23   Champ Mungo, DO  apixaban (ELIQUIS) 5 MG TABS tablet Take 1 tablet (5 mg total) by mouth 2 (two) times daily. 06/03/23   [name], DO  Cholecalciferol (D3-1000) 25 MCG (1000 UT) tablet Take 500 Units by mouth daily.    [provider]  cinacalcet (SENSIPAR) 30 MG tablet Take 1 tablet (30 mg total) by mouth every dialysis. Patient taking differently: Take 30 mg by mouth every Monday, Wednesday, and Friday. 09/12/23   Lovie Macadamia, MD  clindamycin (CLINDAGEL) 1 % gel Apply topically 2 (two) times daily. 10/06/23   [name], DO  Darbepoetin Alfa (ARANESP) 150 MCG/0.3ML SOSY injection To be given during dialysis by [facility] per Nephrology. Not to be given to patient  at SNF. 09/06/23   Lovie Macadamia, MD  digoxin (LANOXIN) 0.125 MG tablet Take 1 tablet (0.125 mg total) by mouth 3 (three) times a week. Monday, Wednesday, Friday (dialysis days) 07/22/23   Rocky Morel, DO  hydrocerin (EUCERIN) CREA Apply 1 Application topically daily. 10/22/23 11/21/23  Jeral Pinch, DO  levothyroxine (SYNTHROID) 25 MCG tablet Take 1 tablet (25 mcg total) by mouth daily at 6 (six) AM. 07/21/23   Rocky Morel, DO  midodrine (PROAMATINE) 10 MG tablet Take 3 tablets (30 mg total) by mouth 3 (three) times daily with meals. 06/20/23   Setzer, Lynnell Jude, PA-C  naloxone [facility]) 2 MG/2ML injection Inject 2 mg into the skin as needed.    [provider]  Oxycodone HCl 10 MG TABS Take 1 tablet (10 mg total) by mouth every 6 (six) hours as needed. 10/28/23   [name], DO  pregabalin (LYRICA) 50 MG capsule Take 1 capsule (50 mg total) by mouth daily. Take one capsule daily. 07/25/23   Champ Mungo, DO  sevelamer carbonate (RENVELA) 800 MG tablet Take 2 tablets (1,600 mg total) by mouth  3 (three) times daily with meals. Patient not taking: Reported on 10/11/2023 10/06/23   [name], DO  tirzepatide [facility]) 2.5 MG/0.5ML injection vial Inject 2.5 mg into the skin once a week. Patient not taking: Reported on 10/11/2023 08/25/23   Lovie Macadamia, MD  triamcinolone ointment (KENALOG) 0.1 % Apply topically 2 (two) times daily. 09/12/23   Lovie Macadamia, MD  venlafaxine XR (EFFEXOR XR) 37.5 MG 24 hr capsule Take 1 capsule (37.5 mg total) by mouth daily. 07/07/23 07/06/24  [name], DO   Current Facility-Administered Medications  Medication Dose Route Frequency Provider Last Rate Last Admin   [START ON 11/02/2023] Chlorhexidine Gluconate Cloth 2 % PADS 6 each  6 each Topical Q0600 Julien Nordmann, PA-C       vancomycin (VANCOREADY) IVPB 2000 mg/400 mL  2,000 mg Intravenous Once Pricilla Loveless, MD 200 mL/hr at 11/01/23 1448 2,000 mg at 11/01/23 1448   Current Outpatient Medications  Medication Sig Dispense Refill   acetaminophen (TYLENOL) 500 MG tablet Take 2 tablets (1,000 mg total) by mouth every 6 (six) hours. 30 tablet 0   apixaban (ELIQUIS) 5 MG TABS tablet Take 1 tablet (5 mg total) by mouth 2 (two) times daily.     Cholecalciferol (D3-1000) 25 MCG (1000 UT) tablet Take 500 Units by mouth daily.     cinacalcet (SENSIPAR) 30 MG tablet Take 1 tablet (30 mg total) by mouth every dialysis. (Patient taking differently: Take 30 mg by mouth every Monday, Wednesday, and Friday.) 60 tablet 0   clindamycin (CLINDAGEL) 1 % gel Apply topically 2 (two) times daily. 30 g 0   Darbepoetin Alfa (ARANESP) 150 MCG/0.3ML SOSY injection To be given during dialysis by [facility] per Nephrology. Not to be given to patient at SNF.     digoxin (LANOXIN) 0.125 MG tablet Take 1 tablet (0.125 mg total) by mouth 3 (three) times a week. Monday, Wednesday, Friday (dialysis days) 30 tablet 1   hydrocerin (EUCERIN) CREA Apply 1 Application topically daily. 120 g 0    levothyroxine (SYNTHROID) 25 MCG tablet Take 1 tablet (25 mcg total) by mouth daily at 6 (six) AM. 90 tablet 3   midodrine (PROAMATINE) 10 MG tablet Take 3 tablets (30 mg total) by mouth 3 (three) times daily with meals. 270 tablet 0   naloxone (NARCAN) 2 MG/2ML injection Inject 2 mg into the skin  as needed.     Oxycodone HCl 10 MG TABS Take 1 tablet (10 mg total) by mouth every 6 (six) hours as needed. 30 tablet 0   pregabalin (LYRICA) 50 MG capsule Take 1 capsule (50 mg total) by mouth daily. Take one capsule daily. 90 capsule 1   sevelamer carbonate (RENVELA) 800 MG tablet Take 2 tablets (1,600 mg total) by mouth 3 (three) times daily with meals. (Patient not taking: Reported on 10/11/2023) 180 tablet 0   tirzepatide (ZEPBOUND) 2.5 MG/0.5ML injection vial Inject 2.5 mg into the skin once a week. (Patient not taking: Reported on 10/11/2023) 2 mL 1   triamcinolone ointment (KENALOG) 0.1 % Apply topically 2 (two) times daily.     venlafaxine XR (EFFEXOR XR) 37.5 MG 24 hr capsule Take 1 capsule (37.5 mg total) by mouth daily. 30 capsule 2   Labs: Basic Metabolic Panel: Recent Labs  Lab 11/01/23 1004 11/01/23 1020  NA 132* 130*  K 4.8 4.6  CL 95* 99  CO2 18*  --   GLUCOSE 108* 106*  BUN 61* 53*  CREATININE 10.84* 12.50*  CALCIUM 9.5  --    Liver Function Tests: Recent Labs  Lab 11/01/23 1004  AST 19  ALT 16  ALKPHOS 208*  BILITOT 1.0  PROT 7.2  ALBUMIN 3.1*   CBC: Recent Labs  Lab 11/01/23 1004 11/01/23 1020  WBC 8.5  --   NEUTROABS 5.5  --   HGB 10.5* 11.2*  HCT 33.8* 33.0*  MCV 106.6*  --   PLT 178  --    Studies/Results: CT PELVIS WO CONTRAST Result Date: 11/01/2023 CLINICAL DATA:  Vomiting.  Cellulitis. EXAM: CT PELVIS WITHOUT CONTRAST TECHNIQUE: Multidetector CT imaging of the pelvis was performed following the standard protocol without intravenous contrast. RADIATION DOSE REDUCTION: This exam was performed according to the departmental dose-optimization program  which includes automated exposure control, adjustment of the mA and/or kV according to patient size and/or use of iterative reconstruction technique. COMPARISON:  July 01, 2023. FINDINGS: Urinary Tract:  Urinary bladder is decompressed. Bowel:  Unremarkable visualized pelvic bowel loops. Vascular/Lymphatic: No pathologically enlarged lymph nodes. No significant vascular abnormality seen. Reproductive:  Prostate is unremarkable. Other: No ascites or hernia is noted. Stable irregular densities are seen in the subcutaneous tissues posterior to the sacrum which may represent scarring, or possibly cellulitis. Definite fluid collection or abscess is not identified. Musculoskeletal: No suspicious bone lesions identified. IMPRESSION: Stable irregular densities are noted in the subcutaneous tissues posterior to the sacrum which may represent cellulitis or scarring from prior inflammation or infection. No definite fluid collection or abscess is noted. Electronically Signed   By: Lupita Raider M.D.   On: 11/01/2023 13:19   DG Chest Port 1 View Result Date: 11/01/2023 CLINICAL DATA:  Shortness of breath EXAM: PORTABLE CHEST 1 VIEW COMPARISON:  10/10/2023 FINDINGS: Right dialysis catheter remains in place, unchanged. Cardiomegaly. No confluent airspace opacities, effusions or edema. No acute bony abnormality. IMPRESSION: Cardiomegaly.  No active disease. Electronically Signed   By: Charlett Nose M.D.   On: 11/01/2023 10:23   Dialysis Orders:  MWF - [facility] 4:30hr, 400/800, EDW 155.5kg, 2K/2Ca, TDC, no heparin (HIT) - Mircera IV q 2 weeks - last 4/4 - Sensipar 30mg  PO HD  Assessment/Plan:  Acute on Chronic Resp Failure: Chronically overloaded.  ESRD: Missed HD yesterday -> run today if schedule allows, max UF.  Hypotension/volume: BP low, stable on mido. Severe edema  Anemia: Hgb 11.2, ok - just  given ESA as outpatient.  Metabolic bone disease: Ca/Phos pending. Continue home binders.   HFrEF A-fib Neuropathy  Ozzie Hoyle, PA-C 11/01/2023, 3:48 PM  [facility]

## 2023-11-02 DIAGNOSIS — L03312 Cellulitis of back [any part except buttock]: Secondary | ICD-10-CM | POA: Diagnosis present

## 2023-11-02 DIAGNOSIS — B952 Enterococcus as the cause of diseases classified elsewhere: Secondary | ICD-10-CM

## 2023-11-02 DIAGNOSIS — Z515 Encounter for palliative care: Secondary | ICD-10-CM | POA: Diagnosis not present

## 2023-11-02 DIAGNOSIS — Z5982 Transportation insecurity: Secondary | ICD-10-CM | POA: Diagnosis not present

## 2023-11-02 DIAGNOSIS — Z992 Dependence on renal dialysis: Secondary | ICD-10-CM | POA: Diagnosis not present

## 2023-11-02 DIAGNOSIS — J9621 Acute and chronic respiratory failure with hypoxia: Secondary | ICD-10-CM | POA: Diagnosis present

## 2023-11-02 DIAGNOSIS — F4323 Adjustment disorder with mixed anxiety and depressed mood: Secondary | ICD-10-CM | POA: Diagnosis not present

## 2023-11-02 DIAGNOSIS — N186 End stage renal disease: Secondary | ICD-10-CM | POA: Diagnosis present

## 2023-11-02 DIAGNOSIS — I482 Chronic atrial fibrillation, unspecified: Secondary | ICD-10-CM | POA: Diagnosis not present

## 2023-11-02 DIAGNOSIS — I428 Other cardiomyopathies: Secondary | ICD-10-CM | POA: Diagnosis present

## 2023-11-02 DIAGNOSIS — E871 Hypo-osmolality and hyponatremia: Secondary | ICD-10-CM | POA: Diagnosis present

## 2023-11-02 DIAGNOSIS — Z1611 Resistance to penicillins: Secondary | ICD-10-CM | POA: Diagnosis present

## 2023-11-02 DIAGNOSIS — I4819 Other persistent atrial fibrillation: Secondary | ICD-10-CM | POA: Diagnosis present

## 2023-11-02 DIAGNOSIS — E66813 Obesity, class 3: Secondary | ICD-10-CM | POA: Diagnosis present

## 2023-11-02 DIAGNOSIS — R7881 Bacteremia: Secondary | ICD-10-CM | POA: Diagnosis present

## 2023-11-02 DIAGNOSIS — Z9981 Dependence on supplemental oxygen: Secondary | ICD-10-CM | POA: Diagnosis not present

## 2023-11-02 DIAGNOSIS — N2581 Secondary hyperparathyroidism of renal origin: Secondary | ICD-10-CM | POA: Diagnosis present

## 2023-11-02 DIAGNOSIS — E872 Acidosis, unspecified: Secondary | ICD-10-CM | POA: Diagnosis present

## 2023-11-02 DIAGNOSIS — G8929 Other chronic pain: Secondary | ICD-10-CM | POA: Diagnosis present

## 2023-11-02 DIAGNOSIS — I5082 Biventricular heart failure: Secondary | ICD-10-CM | POA: Diagnosis present

## 2023-11-02 DIAGNOSIS — D631 Anemia in chronic kidney disease: Secondary | ICD-10-CM | POA: Diagnosis present

## 2023-11-02 DIAGNOSIS — R0602 Shortness of breath: Secondary | ICD-10-CM | POA: Diagnosis present

## 2023-11-02 DIAGNOSIS — E8770 Fluid overload, unspecified: Secondary | ICD-10-CM | POA: Diagnosis present

## 2023-11-02 DIAGNOSIS — E039 Hypothyroidism, unspecified: Secondary | ICD-10-CM | POA: Diagnosis present

## 2023-11-02 DIAGNOSIS — I5022 Chronic systolic (congestive) heart failure: Secondary | ICD-10-CM | POA: Diagnosis present

## 2023-11-02 DIAGNOSIS — E875 Hyperkalemia: Secondary | ICD-10-CM | POA: Diagnosis not present

## 2023-11-02 DIAGNOSIS — Z5941 Food insecurity: Secondary | ICD-10-CM | POA: Diagnosis not present

## 2023-11-02 DIAGNOSIS — A4181 Sepsis due to Enterococcus: Secondary | ICD-10-CM | POA: Diagnosis present

## 2023-11-02 LAB — RENAL FUNCTION PANEL
Albumin: 3 g/dL — ABNORMAL LOW (ref 3.5–5.0)
Anion gap: 18 — ABNORMAL HIGH (ref 5–15)
BUN: 42 mg/dL — ABNORMAL HIGH (ref 6–20)
CO2: 21 mmol/L — ABNORMAL LOW (ref 22–32)
Calcium: 9.2 mg/dL (ref 8.9–10.3)
Chloride: 92 mmol/L — ABNORMAL LOW (ref 98–111)
Creatinine, Ser: 8.13 mg/dL — ABNORMAL HIGH (ref 0.61–1.24)
GFR, Estimated: 8 mL/min — ABNORMAL LOW (ref >60)
Glucose, Bld: 117 mg/dL — ABNORMAL HIGH (ref 70–99)
Phosphorus: 5.6 mg/dL — ABNORMAL HIGH (ref 2.5–4.6)
Potassium: 3.8 mmol/L (ref 3.5–5.1)
Sodium: 131 mmol/L — ABNORMAL LOW (ref 135–145)

## 2023-11-02 LAB — CBC
HCT: 30.2 % — ABNORMAL LOW (ref 39.0–52.0)
Hemoglobin: 10 g/dL — ABNORMAL LOW (ref 13.0–17.0)
MCH: 33.7 pg (ref 26.0–34.0)
MCHC: 33.1 g/dL (ref 30.0–36.0)
MCV: 101.7 fL — ABNORMAL HIGH (ref 80.0–100.0)
Platelets: 175 10*3/uL (ref 150–400)
RBC: 2.97 MIL/uL — ABNORMAL LOW (ref 4.22–5.81)
RDW: 16.3 % — ABNORMAL HIGH (ref 11.5–15.5)
WBC: 7.6 10*3/uL (ref 4.0–10.5)
nRBC: 0 % (ref 0.0–0.2)

## 2023-11-02 LAB — BLOOD CULTURE ID PANEL (REFLEXED) - BCID2
A.calcoaceticus-baumannii: NOT DETECTED
Bacteroides fragilis: NOT DETECTED
Candida albicans: NOT DETECTED
Candida auris: NOT DETECTED
Candida glabrata: NOT DETECTED
Candida krusei: NOT DETECTED
Candida parapsilosis: NOT DETECTED
Candida tropicalis: NOT DETECTED
Cryptococcus neoformans/gattii: NOT DETECTED
Enterobacter cloacae complex: NOT DETECTED
Enterobacterales: NOT DETECTED
Enterococcus Faecium: NOT DETECTED
Enterococcus faecalis: DETECTED — AB
Escherichia coli: NOT DETECTED
Haemophilus influenzae: NOT DETECTED
Klebsiella aerogenes: NOT DETECTED
Klebsiella oxytoca: NOT DETECTED
Klebsiella pneumoniae: NOT DETECTED
Listeria monocytogenes: NOT DETECTED
Methicillin resistance mecA/C: DETECTED — AB
Neisseria meningitidis: NOT DETECTED
Proteus species: NOT DETECTED
Pseudomonas aeruginosa: NOT DETECTED
Salmonella species: NOT DETECTED
Serratia marcescens: NOT DETECTED
Staphylococcus aureus (BCID): NOT DETECTED
Staphylococcus epidermidis: DETECTED — AB
Staphylococcus lugdunensis: NOT DETECTED
Staphylococcus species: DETECTED — AB
Stenotrophomonas maltophilia: NOT DETECTED
Streptococcus agalactiae: NOT DETECTED
Streptococcus pneumoniae: NOT DETECTED
Streptococcus pyogenes: NOT DETECTED
Streptococcus species: NOT DETECTED
Vancomycin resistance: NOT DETECTED

## 2023-11-02 MED ORDER — VANCOMYCIN HCL IN DEXTROSE 1-5 GM/200ML-% IV SOLN
1000.0000 mg | Freq: Once | INTRAVENOUS | Status: AC
  Administered 2023-11-02: 1000 mg via INTRAVENOUS
  Filled 2023-11-02: qty 200

## 2023-11-02 MED ORDER — VANCOMYCIN HCL IN DEXTROSE 1-5 GM/200ML-% IV SOLN
1000.0000 mg | INTRAVENOUS | Status: DC
  Administered 2023-11-03: 1000 mg via INTRAVENOUS
  Filled 2023-11-02 (×2): qty 200

## 2023-11-02 MED ORDER — CAMPHOR-MENTHOL 0.5-0.5 % EX LOTN: TOPICAL_LOTION | CUTANEOUS | Status: DC | PRN

## 2023-11-02 NOTE — Progress Notes (Signed)
 [facility] Progress Note   Subjective:   Seen in room - feels ok today. Says HD went fine last night, 5.5L off. Blood Cx 4/8 showed 1 of 2 positive with Enterococcus + Staph, repeat Blood Cx 4/9 negative so far - assuming contaminant, but on Vanc + Cefepime + Flagyl. 5 cups on bedside table - reminded yet again of fluid restrictions.  Objective Vitals:   11/01/23 2237 11/02/23 0222 11/02/23 0500 11/02/23 0742  BP: 107/70 (!) 106/56 111/65 92/72  Pulse: 62 88 89 83  Resp: 17 20  19   Temp: 98.1 F (36.7 C) 98 F (36.7 C) 98 F (36.7 C) 98.4 F (36.9 C)  TempSrc:  Oral Oral   SpO2: 100% 100% 100% 100%  Weight:      Height:       Physical Exam General: Chronically ill appearing, obese man, NAD Heart: RRR Lungs: CTA anteriorly Abdomen: soft Extremities: 4+ BLE pitting edema Dialysis Access:  TDC in R chest  Additional Objective Labs: Basic Metabolic Panel: Recent Labs  Lab 11/01/23 1004 11/01/23 1020 11/02/23 0523  NA 132* 130* 131*  K 4.8 4.6 3.8  CL 95* 99 92*  CO2 18*  --  21*  GLUCOSE 108* 106* 117*  BUN 61* 53* 42*  CREATININE 10.84* 12.50* 8.13*  CALCIUM 9.5  --  9.2  PHOS  --   --  5.6*   Liver Function Tests: Recent Labs  Lab 11/01/23 1004 11/02/23 0523  AST 19  --   ALT 16  --   ALKPHOS 208*  --   BILITOT 1.0  --   PROT 7.2  --   ALBUMIN 3.1* 3.0*   CBC: Recent Labs  Lab 11/01/23 1004 11/01/23 1020 11/02/23 0523  WBC 8.5  --  7.6  NEUTROABS 5.5  --   --   HGB 10.5* 11.2* 10.0*  HCT 33.8* 33.0* 30.2*  MCV 106.6*  --  101.7*  PLT 178  --  175   Blood Culture    Component Value Date/Time   SDES BLOOD LEFT ARM 11/02/2023 0520   SPECREQUEST  11/02/2023 0520    BOTTLES DRAWN AEROBIC AND ANAEROBIC Blood Culture adequate volume   CULT  11/02/2023 0520    NO GROWTH < 12 HOURS Performed at [facility], [address] [place], [place] [address]    REPTSTATUS PENDING 11/02/2023 8295   Studies/Results: CT FEMUR  RIGHT WO CONTRAST Result Date: 11/01/2023 CLINICAL DATA:  Cellulitis. Dialysis patient Monday Wednesday Friday. Missed yesterday. Vomited last night. History of chronic sacral wounds. EXAM: CT OF THE LOWER RIGHT EXTREMITY WITHOUT CONTRAST TECHNIQUE: Multidetector CT imaging of the right lower extremity was performed according to the standard protocol. RADIATION DOSE REDUCTION: This exam was performed according to the departmental dose-optimization program which includes automated exposure control, adjustment of the mA and/or kV according to patient size and/or use of iterative reconstruction technique. COMPARISON:  CT pelvis 07/01/2023 FINDINGS: Bones/Joint/Cartilage There is decreased bony detail due to patient large body habitus and photon starvation artifact. Mild lateral right knee patellofemoral joint space narrowing. Moderate superior right femoroacetabular joint space narrowing. No acute fracture is seen. No cortical erosion is identified. Ligaments Suboptimally assessed by CT. Muscles and Tendons No gross abnormality is seen in size or density of the regional musculature. No gross tendon tear is seen. Soft tissues Large body habitus. Diffuse moderate subcutaneous fat edema and swelling throughout the right thigh and visualized portion of the medial left thigh. There is  moderate skin thickening diffusely. No definite walled-off abscess is seen, within the limitations of the photon starvation and artifact. No knee joint effusion is seen. There are multiple prominent right inguinal lymph nodes, measuring up to 10 mm in short axis. These are presumably reactive. IMPRESSION: 1. Diffuse moderate subcutaneous fat edema and swelling throughout the right thigh and visualized portion of the medial left thigh. There is moderate skin thickening diffusely. No definite walled-off abscess is seen, within the limitations of large body habitus and associated photon starvation/artifact. 2. No acute fracture is seen. No  cortical erosion is identified. 3. Multiple prominent right inguinal lymph nodes, measuring up to 10 mm in short axis. These are presumably reactive. Electronically Signed   By: Neita Garnet M.D.   On: 11/01/2023 16:56   CT PELVIS WO CONTRAST Result Date: 11/01/2023 CLINICAL DATA:  Vomiting.  Cellulitis. EXAM: CT PELVIS WITHOUT CONTRAST TECHNIQUE: Multidetector CT imaging of the pelvis was performed following the standard protocol without intravenous contrast. RADIATION DOSE REDUCTION: This exam was performed according to the departmental dose-optimization program which includes automated exposure control, adjustment of the mA and/or kV according to patient size and/or use of iterative reconstruction technique. COMPARISON:  July 01, 2023. FINDINGS: Urinary Tract:  Urinary bladder is decompressed. Bowel:  Unremarkable visualized pelvic bowel loops. Vascular/Lymphatic: No pathologically enlarged lymph nodes. No significant vascular abnormality seen. Reproductive:  Prostate is unremarkable. Other: No ascites or hernia is noted. Stable irregular densities are seen in the subcutaneous tissues posterior to the sacrum which may represent scarring, or possibly cellulitis. Definite fluid collection or abscess is not identified. Musculoskeletal: No suspicious bone lesions identified. IMPRESSION: Stable irregular densities are noted in the subcutaneous tissues posterior to the sacrum which may represent cellulitis or scarring from prior inflammation or infection. No definite fluid collection or abscess is noted. Electronically Signed   By: Lupita Raider M.D.   On: 11/01/2023 13:19   DG Chest Port 1 View Result Date: 11/01/2023 CLINICAL DATA:  Shortness of breath EXAM: PORTABLE CHEST 1 VIEW COMPARISON:  10/10/2023 FINDINGS: Right dialysis catheter remains in place, unchanged. Cardiomegaly. No confluent airspace opacities, effusions or edema. No acute bony abnormality. IMPRESSION: Cardiomegaly.  No active disease.  Electronically Signed   By: Charlett Nose M.D.   On: 11/01/2023 10:23   Medications:  vancomycin      apixaban  5 mg Oral BID   Chlorhexidine Gluconate Cloth  6 each Topical Q0600   cinacalcet  30 mg Oral Q M,W,F-HD   digoxin  0.125 mg Oral Q M,W,F   levothyroxine  25 mcg Oral Q0600   midodrine  30 mg Oral TID WC   pregabalin  50 mg Oral Daily   sevelamer carbonate  1,600 mg Oral TID WC   venlafaxine XR  37.5 mg Oral Daily    Dialysis Orders MWF - [facility] 4:30hr, 400/800, EDW 155.5kg, 2K/2Ca, TDC, no heparin (HIT) - Mircera IV q 2 weeks - last 4/4 - Sensipar 30mg  PO HD   Assessment/Plan:  Acute on Chronic Resp Failure: Chronically overloaded.  Sacral Cellulitis: On Vanc + Cefepime + Flagyl for now. Positive Blood Cx 1 of 2 on 4/8, repeat 4/9 negative so far. Assuming contaminant? On broad spectrum abx.  ESRD: Missed HD Monday -> s/p HD last night. Will plan to run tonight if schedule allows, otherwise next HD tomorrow.  Hypotension/volume: BP low, stable on mido. Severe edema  Anemia: Hgb 10 - just given ESA as outpatient.  Metabolic bone disease:  Ca/Phos pending. Continue home binders/sensipar.  HFrEF A-fib Neuropathy     Ozzie Hoyle, PA-C 11/02/2023, 10:20 AM  [employer]

## 2023-11-02 NOTE — Progress Notes (Cosign Needed)
                  Subjective:   Summary: Phillip Young is a 38 yo male with chronic hypoxic respiratory failure on 2-4L home O2, HFrEF w/LVEF <20%, Afib on Eliquis, chronic pain, ESRD on HD MWF who presented to the ED with shortness of breath. He is being treated for volume overload due to missed HD session as well as E faecalis and MRSE bacteremia  Patient feeling somewhat better today. He feels his breathing and edema have improved after receiving HD last night. Discussed his financial and insurance situation and current inability to afford most of his medications.   Objective:  Vital signs in last 24 hours: Vitals:   11/03/23 1033 11/03/23 1100 11/03/23 1118 11/03/23 1143  BP: (!) 72/50   (!) 114/57  Pulse: (!) 110 (!) 106 (!) 123 97  Resp: 18 (!) 25 (!) 24   Temp:    (!) 97.5 F (36.4 C)  TempSrc:    Oral  SpO2: 100% 99% 100% 100%  Weight:      Height:       Supplemental O2: Nasal Cannula SpO2: 100 % O2 Flow Rate (L/min): 6 L/min  Physical Exam:  Constitutional: fatigued appearing, sitting up in bed Cardiovascular: irregularly irregular rhythm Pulmonary/Chest: decreased breath sounds, difficult to auscultate with body habitus Neuro: A&O x 3 Skin: chronic subcutaneous nodules representing scar tissue from chronic/recurrent abscesses palpated at superior gluteal cleft L>R Legs heavy and tense with edema up to prox thighs; lower legs and feet are tender  Assessment/Plan:   Acute on chronic hypoxic respiratory failure  Hx presumed amiodarone toxicity HFrEF, LVEF <20% ESRD on HD Chronic hypotension managed with midodrine (limits GDMT) Patient subjectively feels his breathing and edema are improved after HD, though is still requiring 6L [place] to maintain appropriate O2 saturations. He has an unfortunate social situation and is currently unable to afford many of his medications except for his Eliquis and oxycodone. He is working on applying for [employer].  - HD per  nephrology  Enterococcus faecalis and MRSE bacteremia Blood cultures positive for Enterococcus faecalis and staph epidermitis. Staph species favored to be contamination, E. Faecalis is likely real growth. Patient is relatively asymptomatic with no fever or leukocytosis or other systemic symptoms, aside from general malaise.  - Vancomycin per pharmacy will cover both organisms  Chronic somatic and neuropathic pain Continue venlafaxine 37.5mg  daily, lyrica 50mg  daily, oxycodone 10mg  q6h prn  - added Sarna lotion for the itching component of his neuropathy  Anemia of chronic disease: Hgb stable, no evidence of acute blood loss Paroxysmal atrial fibrillation: Continue home Eliquis 5mg  BID, Digoxin 0.125mg  MWF Hypothyroidism: Continue home levothyroxine . Depression: Continue home venlafaxine 37.5mg   Diet: Renal VTE: Eliquis Code: Full  Dispo: Anticipated discharge to Home pending medical stability.   Monna Fam, MD PGY-1 Internal Medicine Resident Pager Number [phone] Please contact the on call pager after 5 pm and on weekends at [phone].

## 2023-11-02 NOTE — Progress Notes (Addendum)
 PHARMACY - PHYSICIAN COMMUNICATION CRITICAL VALUE ALERT - BLOOD CULTURE IDENTIFICATION (BCID)  Phillip Young is an 38 y.o. male who presented to [facility] on 11/01/2023 with a chief complaint of dyspnea   Name of physician (or Provider) Contacted: Dr. Rosaura Carpenter  Current antibiotics: None  Changes to prescribed antibiotics recommended:  Add vancomycin for MRSE and E faecalis coverage, pt has PCN allergy noted on profile  >>Vancomycin 2000 mg IV x 1 already given >>Vancomycin 1000 mg IV qHD MWF >>Will give an additional 1000 mg of vancomycin now since he got off schedule HD yesterday after the 2000 mg vancomycin load  Results for orders placed or performed during the hospital encounter of 11/01/23  Blood Culture ID Panel (Reflexed) (Collected: 11/01/2023  9:37 AM)  Result Value Ref Range   Enterococcus faecalis DETECTED (A) NOT DETECTED   Enterococcus Faecium NOT DETECTED NOT DETECTED   Listeria monocytogenes NOT DETECTED NOT DETECTED   Staphylococcus species DETECTED (A) NOT DETECTED   Staphylococcus aureus (BCID) NOT DETECTED NOT DETECTED   Staphylococcus epidermidis DETECTED (A) NOT DETECTED   Staphylococcus lugdunensis NOT DETECTED NOT DETECTED   Streptococcus species NOT DETECTED NOT DETECTED   Streptococcus agalactiae NOT DETECTED NOT DETECTED   Streptococcus pneumoniae NOT DETECTED NOT DETECTED   Streptococcus pyogenes NOT DETECTED NOT DETECTED   A.calcoaceticus-baumannii NOT DETECTED NOT DETECTED   Bacteroides fragilis NOT DETECTED NOT DETECTED   Enterobacterales NOT DETECTED NOT DETECTED   Enterobacter cloacae complex NOT DETECTED NOT DETECTED   Escherichia coli NOT DETECTED NOT DETECTED   Klebsiella aerogenes NOT DETECTED NOT DETECTED   Klebsiella oxytoca NOT DETECTED NOT DETECTED   Klebsiella pneumoniae NOT DETECTED NOT DETECTED   Proteus species NOT DETECTED NOT DETECTED   Salmonella species NOT DETECTED NOT DETECTED   Serratia marcescens NOT DETECTED NOT DETECTED    Haemophilus influenzae NOT DETECTED NOT DETECTED   Neisseria meningitidis NOT DETECTED NOT DETECTED   Pseudomonas aeruginosa NOT DETECTED NOT DETECTED   Stenotrophomonas maltophilia NOT DETECTED NOT DETECTED   Candida albicans NOT DETECTED NOT DETECTED   Candida auris NOT DETECTED NOT DETECTED   Candida glabrata NOT DETECTED NOT DETECTED   Candida krusei NOT DETECTED NOT DETECTED   Candida parapsilosis NOT DETECTED NOT DETECTED   Candida tropicalis NOT DETECTED NOT DETECTED   Cryptococcus neoformans/gattii NOT DETECTED NOT DETECTED   Methicillin resistance mecA/C DETECTED (A) NOT DETECTED   Vancomycin resistance NOT DETECTED NOT DETECTED    Abran Duke, PharmD, BCPS Clinical Pharmacist Phone: [phone]

## 2023-11-02 NOTE — Consult Note (Signed)
 [facility]    Date of Admission:  11/01/2023     Reason for Consult: bsi    Referring Provider: Paul Dykes     Lines:  Right chest tunneled cath  Abx: 4/8-c vanc  Assessment: 38 yo male esrd on ihd via right chest tunneled cath, hx mssa bsi (03/2021 with paraspinal abscess, lef sacral om, left si joint septic arthritis; 04/2023 secondary to catheter not able to do tee s/p 6 weeks cefazolin), admitted 4/8 for 1 week malaise found to have sepsis and e faecalis / mrse bsi     4/8 bcx gpc (bcid e faecalis and staph epi -- mrse) 4/9 repeat bcx in progress  No focal joint pain  Suspect right chest catheter could be seeded; will need to r/o e faecalis / mrse bsi as abx management would change      Plan: F/u repeat bcx Tte and tee Please discuss with nephrology to remove line and allow 2-3 day line holiday Please get blood culture after line is removed, on the day of removal; please also send catheter tip for semiquantitative cx Continue vanc for now Standard isolation precaution Discussed with primary team      ------------------------------------------------ Principal Problem:   Acute on chronic hypoxic respiratory failure ([name])    HPI: Phillip Young is a 38 y.o. male esrd on ihd via right chest tunneled cath, hx mssa bsi (03/2021 with paraspinal abscess, lef sacral om, left si joint septic arthritis; 04/2023 secondary to catheter not able to do tee s/p 6 weeks cefazolin), admitted 4/8 for 1 week malaise found to have sepsis and e faecalis / mrse bsi  Patient reports dyspnea and legs swelling and feeling unwell for a week No focal pain outside of the legs  No subjective f/c  Due to these sx came for evaluation 4/8 Afebrile No leukocytosis Bcx however with mrse and e faecalis Started on vanc  No other complaint No ivdu  Family History  Problem Relation Age of Onset   Heart disease Mother    Hypertension  Mother    Pulmonary Hypertension Mother    Drug abuse Father        died due to Heroin overdose    Social History   Tobacco Use   Smoking status: Former    Current packs/day: 0.00    Types: Cigarettes    Quit date: 2019    Years since quitting: 6.2    Passive exposure: Current   Smokeless tobacco: Former   Tobacco comments:    quit in 2019  Vaping Use   Vaping status: Never Used  Substance Use Topics   Alcohol use: Never   Drug use: Never    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, SRA positive   Metoprolol Other (See Comments)    near syncope   Amoxicillin Other (See Comments)    Was hospitalized    Other Swelling and Other (See Comments)    Steroids Fluid seeping out of legs    Tramadol     Review of Systems: ROS All Other ROS was negative, except mentioned above   Past Medical History:  Diagnosis Date   Acute on chronic respiratory failure with hypoxia ([name]) 04/21/2021  Acute on chronic systolic (congestive) heart failure ([name]) 02/26/2020   Amiodarone toxicity    Anemia    Atrial flutter ([name])    Biventricular congestive heart failure ([name])    Chronic hypoxemic respiratory failure ([name])    Class 3 severe obesity due to excess calories with serious comorbidity and body mass index (BMI) of 50.0 to 59.9 in adult [facility]) 02/26/2020   Essential hypertension 02/26/2020   GERD without esophagitis 02/26/2020   Hidradenitis suppurativa 02/26/2020   NICM (nonischemic cardiomyopathy) ([name])    Obesity hypoventilation syndrome ([name])    OSA (obstructive sleep apnea)    PAF (paroxysmal atrial fibrillation) ([name])    Pneumonia    Prediabetes 02/26/2020       Scheduled Meds:  apixaban  5 mg Oral BID   Chlorhexidine Gluconate Cloth  6 each Topical Q0600    cinacalcet  30 mg Oral Q M,W,F-HD   digoxin  0.125 mg Oral Q M,W,F   levothyroxine  25 mcg Oral Q0600   midodrine  30 mg Oral TID WC   pregabalin  50 mg Oral Daily   sevelamer carbonate  1,600 mg Oral TID WC   venlafaxine XR  37.5 mg Oral Daily   Continuous Infusions:  vancomycin     PRN Meds:.camphor-menthol, oxyCODONE   OBJECTIVE: Blood pressure (!) 90/37, pulse 90, temperature 97.6 F (36.4 C), temperature source Oral, resp. rate 19, height 6' (1.829 m), weight (!) 198.2 kg, SpO2 100%.  Physical Exam General/constitutional: no distress, pleasant HEENT: Normocephalic, PER, Conj Clear, EOMI, Oropharynx clear Neck supple CV: rrr no mrg Lungs: clear to auscultation, normal respiratory effort Abd: Soft, Nontender Ext: bilateral 1-2+ edema to knees; tender to touch; not warm or red Skin: hypigmented macules/papules trunk/ext Neuro: nonfocal - generalized weakness MSK: no peripheral joint swelling/tenderness/warmth; back spines nontender   Central line presence: right chest cath site no tenderness/purulence    Lab Results Lab Results  Component Value Date   WBC 7.6 11/02/2023   HGB 10.0 (L) 11/02/2023   HCT 30.2 (L) 11/02/2023   MCV 101.7 (H) 11/02/2023   PLT 175 11/02/2023    Lab Results  Component Value Date   CREATININE 8.13 (H) 11/02/2023   BUN 42 (H) 11/02/2023   NA 131 (L) 11/02/2023   K 3.8 11/02/2023   CL 92 (L) 11/02/2023   CO2 21 (L) 11/02/2023    Lab Results  Component Value Date   ALT 16 11/01/2023   AST 19 11/01/2023   ALKPHOS 208 (H) 11/01/2023   BILITOT 1.0 11/01/2023      Microbiology: Recent Results (from the past 240 hours)  Blood Culture (routine x 2)     Status: None (Preliminary result)   Collection Time: 11/01/23  9:37 AM   Specimen: BLOOD RIGHT HAND  Result Value Ref Range Status   Specimen Description BLOOD RIGHT HAND  Final   Special Requests   Final    BOTTLES DRAWN AEROBIC AND ANAEROBIC Blood Culture adequate volume    Culture  Setup Time   Final    GRAM POSITIVE COCCI IN BOTH AEROBIC AND ANAEROBIC BOTTLES CRITICAL RESULT CALLED TO, READ BACK BY AND VERIFIED WITH: PHARMD J LEDFORD 11/02/2023 @ 0420 BY AB Performed at [facility], [address], [place], [place] [address]    Culture GRAM POSITIVE COCCI  Final   Report Status PENDING  Incomplete  Resp panel by RT-PCR (RSV, Flu A&B, Covid) Anterior Nasal Swab     Status: None   Collection  Time: 11/01/23  9:37 AM   Specimen: Anterior Nasal Swab  Result Value Ref Range Status   SARS Coronavirus 2 by RT PCR NEGATIVE NEGATIVE Final   Influenza A by PCR NEGATIVE NEGATIVE Final   Influenza B by PCR NEGATIVE NEGATIVE Final    Comment: (NOTE) The Xpert Xpress SARS-CoV-2/FLU/RSV plus assay is intended as an aid in the diagnosis of influenza from Nasopharyngeal swab specimens and should not be used as a sole basis for treatment. Nasal washings and aspirates are unacceptable for Xpert Xpress SARS-CoV-2/FLU/RSV testing.  Fact Sheet for Patients: BloggerCourse.com  Fact Sheet for Healthcare Providers: SeriousBroker.it  This test is not yet approved or cleared by the [place] FDA and has been authorized for detection and/or diagnosis of SARS-CoV-2 by FDA under an Emergency Use Authorization (EUA). This EUA will remain in effect (meaning this test can be used) for the duration of the COVID-19 declaration under Section 564(b)(1) of the Act, 21 U.S.C. section 360bbb-3(b)(1), unless the authorization is terminated or revoked.     Resp Syncytial Virus by PCR NEGATIVE NEGATIVE Final    Comment: (NOTE) Fact Sheet for Patients: BloggerCourse.com  Fact Sheet for Healthcare Providers: SeriousBroker.it  This test is not yet approved or cleared by the [place] FDA and has been authorized for detection and/or diagnosis of SARS-CoV-2 by FDA under an  Emergency Use Authorization (EUA). This EUA will remain in effect (meaning this test can be used) for the duration of the COVID-19 declaration under Section 564(b)(1) of the Act, 21 U.S.C. section 360bbb-3(b)(1), unless the authorization is terminated or revoked.  Performed at [facility], [address], [place], [place] [address]   Blood Culture ID Panel (Reflexed)     Status: Abnormal   Collection Time: 11/01/23  9:37 AM  Result Value Ref Range Status   Enterococcus faecalis DETECTED (A) NOT DETECTED Final    Comment: CRITICAL RESULT CALLED TO, READ BACK BY AND VERIFIED WITH: PHARMD J LEDFORD 11/02/2023 @ 0420 BY AB    Enterococcus Faecium NOT DETECTED NOT DETECTED Final   Listeria monocytogenes NOT DETECTED NOT DETECTED Final   Staphylococcus species DETECTED (A) NOT DETECTED Final    Comment: CRITICAL RESULT CALLED TO, READ BACK BY AND VERIFIED WITH: PHARMD J LEDFORD 11/02/2023 @ 0420 BY AB    Staphylococcus aureus (BCID) NOT DETECTED NOT DETECTED Final   Staphylococcus epidermidis DETECTED (A) NOT DETECTED Final    Comment: Methicillin (oxacillin) resistant coagulase negative staphylococcus. Possible blood culture contaminant (unless isolated from more than one blood culture draw or clinical case suggests pathogenicity). No antibiotic treatment is indicated for blood  culture contaminants. CRITICAL RESULT CALLED TO, READ BACK BY AND VERIFIED WITH: PHARMD J LEDFORD 11/02/2023 @ 0420 BY AB    Staphylococcus lugdunensis NOT DETECTED NOT DETECTED Final   Streptococcus species NOT DETECTED NOT DETECTED Final   Streptococcus agalactiae NOT DETECTED NOT DETECTED Final   Streptococcus pneumoniae NOT DETECTED NOT DETECTED Final   Streptococcus pyogenes NOT DETECTED NOT DETECTED Final   A.calcoaceticus-baumannii NOT DETECTED NOT DETECTED Final   Bacteroides fragilis NOT DETECTED NOT DETECTED Final   Enterobacterales NOT DETECTED NOT DETECTED Final   Enterobacter cloacae complex NOT  DETECTED NOT DETECTED Final   Escherichia coli NOT DETECTED NOT DETECTED Final   Klebsiella aerogenes NOT DETECTED NOT DETECTED Final   Klebsiella oxytoca NOT DETECTED NOT DETECTED Final   Klebsiella pneumoniae NOT DETECTED NOT DETECTED Final   Proteus species NOT DETECTED NOT DETECTED Final   Salmonella  species NOT DETECTED NOT DETECTED Final   Serratia marcescens NOT DETECTED NOT DETECTED Final   Haemophilus influenzae NOT DETECTED NOT DETECTED Final   Neisseria meningitidis NOT DETECTED NOT DETECTED Final   Pseudomonas aeruginosa NOT DETECTED NOT DETECTED Final   Stenotrophomonas maltophilia NOT DETECTED NOT DETECTED Final   Candida albicans NOT DETECTED NOT DETECTED Final   Candida auris NOT DETECTED NOT DETECTED Final   Candida glabrata NOT DETECTED NOT DETECTED Final   Candida krusei NOT DETECTED NOT DETECTED Final   Candida parapsilosis NOT DETECTED NOT DETECTED Final   Candida tropicalis NOT DETECTED NOT DETECTED Final   Cryptococcus neoformans/gattii NOT DETECTED NOT DETECTED Final   Methicillin resistance mecA/C DETECTED (A) NOT DETECTED Final    Comment: CRITICAL RESULT CALLED TO, READ BACK BY AND VERIFIED WITH: PHARMD J LEDFORD 11/02/2023 @ 0420 BY AB    Vancomycin resistance NOT DETECTED NOT DETECTED Final    Comment: Performed at [facility], [address], [place], [place] [address]  Blood Culture (routine x 2)     Status: None (Preliminary result)   Collection Time: 11/01/23  9:42 AM   Specimen: BLOOD RIGHT FOREARM  Result Value Ref Range Status   Specimen Description BLOOD RIGHT FOREARM  Final   Special Requests   Final    BOTTLES DRAWN AEROBIC AND ANAEROBIC Blood Culture adequate volume   Culture   Final    NO GROWTH < 24 HOURS Performed at [facility], [address] [place], [place] [address]    Report Status PENDING  Incomplete  Culture, blood (Routine X 2) w Reflex to ID Panel     Status: None (Preliminary result)   Collection Time: 11/02/23   5:14 AM   Specimen: BLOOD RIGHT ARM  Result Value Ref Range Status   Specimen Description BLOOD RIGHT ARM  Final   Special Requests   Final    BOTTLES DRAWN AEROBIC AND ANAEROBIC Blood Culture adequate volume   Culture   Final    NO GROWTH < 12 HOURS Performed at [facility], [address], [place], [place] [address]    Report Status PENDING  Incomplete  Culture, blood (Routine X 2) w Reflex to ID Panel     Status: None (Preliminary result)   Collection Time: 11/02/23  5:20 AM   Specimen: BLOOD LEFT ARM  Result Value Ref Range Status   Specimen Description BLOOD LEFT ARM  Final   Special Requests   Final    BOTTLES DRAWN AEROBIC AND ANAEROBIC Blood Culture adequate volume   Culture   Final    NO GROWTH < 12 HOURS Performed at [facility], [address] [place], [place] [address]    Report Status PENDING  Incomplete     Serology:    Imaging: If present, new imagings (plain films, ct scans, and mri) have been personally visualized and interpreted; radiology reports have been reviewed. Decision making incorporated into the Impression / Recommendations.   4/8 cxr FINDINGS: Right dialysis catheter remains in place, unchanged. Cardiomegaly. No confluent airspace opacities, effusions or edema. No acute bony abnormality.   IMPRESSION: Cardiomegaly.  No active disease.  4/8 ct pelvis and right femur Stable irregular densities are noted in the subcutaneous tissues posterior to the sacrum which may represent cellulitis or scarring from prior inflammation or infection. No definite fluid collection or abscess is noted.  1. Diffuse moderate subcutaneous fat edema and swelling throughout the right thigh and visualized portion  of the medial left thigh. There is moderate skin thickening diffusely. No definite walled-off abscess is seen, within the limitations of large body habitus and associated photon starvation/artifact. 2. No acute fracture is seen. No  cortical erosion is identified. 3. Multiple prominent right inguinal lymph nodes, measuring up to 10 mm in short axis. These are presumably reactive.  Raymondo Band, MD [facility] [phone] pager    11/02/2023, 7:31 PM

## 2023-11-02 NOTE — Discharge Instructions (Addendum)
 [facility] Address: [address] [place], [place] [address] Phone Number: [phone] Hours of Operation: Residents of [place] can come to obtain food Monday through Friday from 8:30am until 3:30pm. Photo ID and Social Security cards required for all residents of a household. Can come six times a year  THE [facility] Address: [address] [place], [place] [address] Phone Number: [phone] Hours of Operation: Operates Tuesday-Friday 10:00 a.m. to 1 p.m. Requirements: Referral from DSS needed. May come 6 times a year, 30 days apart. Photo ID and SS required for all residents of household.  [facility] Address: [address] [place], [place] [address] Phone Number: [phone] Hours of Operation: Food pantry is open on the last Saturday of each month from 10:00 am - 12:00 noon. No appointment needed. No qualifications.  [facility] Address: [address] [place], [place] [address] Phone Number: [phone] EXT. [phone] Hours of Operation: Must make reservations to pick up food on Saturdays. Sign ups for Saturday pick up beginning at 8:30 a.m. on Monday morning.  [facility] Address: [address] [place], [place] [address] Phone Number: [phone] Hours of Operation: If you need food, bring proper identification such as a driver's license to receive a bag of food once a month. Requirements: Can come once every 30 days with referral DSS, Holiday representative, Mental health etc. Each referral good for six visits. Photo ID required. *1st visit no referral required.  [facility] Address: [address], [place] [address] Phone Number: [phone]  [facility] Address: [address] [place], [place] [address] Phone Number: [phone] Hours of Operation:  You can register at [email or web address] for free groceries  FREE INDEED FOOD  PANTRY Address: [address], [place] [address] Phone Number: [phone] Hours of Operation: Drive through giveaway, first come first served. Every 3rd Saturday 11AM - 1PM  [facility] [place] [address] Address: [address], [place] [address] Phone Number: [phone]   [place]  HAND TO HAND FOOD PANTRY Address: [address] [place], [place] [address] Phone Number: [phone] Hours of Operation: Once a month every 3rd Saturday  [facility] Address: [address] [place], [place] [address] Phone Number: [phone] Hours of Operation: Distribution happens from 9:00-10:00 a.m. every Saturday.     [facility] Address: [address] [place], [place] [address] Phone Number: [phone] Hours of Operation: ONCE a week for the community food distribution held every Tuesday, Wednesday and Thursday from 11 a.m. - 2:00 p.m. Food is available on a first come, first serve basis and varies week to week. No appointment necessary for drive thru pick up.  [facility] Address: [address] [place], [place] [address] Phone Number: [phone] Hours of Operation: Open every 3rd Thursday 9:30 a.m. - 11:00 a.m.  [facility] Address: [address] [place],  [address] Phone Number: [phone] Hours of Operation: Please call for hours, directions, and questions  [facility] [place] [facility] Address: [address], [place], [place]  [address] Phone Number: [phone] Website: https://www.Hollyguns.co.za Food Finder app: https://findfood.ghpfa.org  [facility] Address: [address] [place], [place] [address] Phone Number: [phone] Hours of Operation: Contact Bree Harpe. Enrolled Substance Abuse Clients Only  [facility] Address: [name] [place], [address]  Phone Number: [phone] Hours of Operation: Contact Alene Ana. Food pantry open the 3rd Saturday of each  month  from 9 a.m. -12 p.m. only  [place] [facility] Address: [address] [place], [place] [address] Phone Number: [phone] Hours of Operation: Contact Loletta Ripple. Emergency food bank open on Saturdays by appointment only  [facility] Address: [address] [place], [place] [address] Phone Number: [phone] Hours of Operation: No specific contact person; Anyone can help  [facility] Address: [address] [place], [place] [address] Phone Number: [phone] Hours of Operation: Contact Julia Oats. Agency gives out a bag of food every Thursday from 2-4 p.m. only, and also provides a community meal every Thursday between 5-6 p.m. Other services provided include rent/mortgage and utility assistance, women's winter shelter, thrift store, and senior adult activities.  [facility] [place] Address: [address] [place], [place] [address] Phone Number: [phone] Hours of Operation: The Emergency Food Assistance Program provides individuals and families with a generous supply of food including meat, fresh vegetables, and nonperishable items. The food box contains five days' worth of food, and each family or individual can receive a box once per month. M, W, Th, Fr 11am-2pm, walk-ins welcome.  [facility] Address: [address] [place], [place] [address]  Phone Number: [phone] Hours of Operation: Contact Asia Blanca Bunch. Tuesdays and Thursdays from 11am - 3pm by appointment only   Rent/Utility Assistance in [facility]:  [facility] [address] [place], [place] [address] [phone] Mon 8:00am - 6:00pm; Tue 8:00am - 6:00pm; Wed 8:00am - 6:00pm; Thu 8:00am - 6:00pm; Fri 8:00am - 6:00pm; Email: [email or web address] Eligibility: Residents of [place], [place], [place], [place], [place] and [place] that meet income limits. Call or text for eligibility  screening.   [facility] [address] [place], [place], [place] [address] [phone] (Main: Rental Assistance) [phone] (Main: Utility Assistance) Mon 8:30am - 5:00pm; Tue 8:30am - 5:00pm; Wed 8:30am - 5:00pm; Thu 8:30am - 5:00pm; Fri 8:30am - 5:00pm; Website: http://www.greensborourbanministry.org/emergency-assistance-program Eligibility: People who have an unexpected crisis or emergency that can be verified. Must have some form of income and meet income limits. At the first of the month, only helps with rent/mortgage assistance for those who have court ordered eviction notices. Call for application information. Call for exact documents that will be needed. Examples of documents that may be needed: Photo ID, Social Security cards for everyone in the household, and proof of income for previous 2 months. Copy of eviction notice for rent assistance and copy of final notice for utility assistance. Statements or receipts of bills for previous 2 months.   [facility] [place] [address] [place], [place] [address] [phone] (Main) [phone] (Alternate) Mon 9:00am - 5:00pm; Tue 9:00am - 5:00pm; Wed 9:00am - 5:00pm; Thu 9:00am - 5:00pm; Fri 9:00am - 5:00pm; Website: http://[email or web address].[email or web address]/[place]/emergency-financial-assistance Email: [email or web address] Eligibility: People experiencing a housing crisis with past-due rent and/or utilities and meet income limits. Must be willing to take part in 6 Call or visit website to download application. Return complete application by mail or email only. Documents: Help with Utilities: Photo ID, proof of household income, copies of monthly bills or receipts, and a final disconnection/shut-off notice. Help with Rent or Mortgage: Photo ID, proof of income, copies of monthly bills or receipts, and eviction notice. Help with Household Goods: Photo ID, proof of household income,  copies of monthly bills or receipts, and a fire or flood report.  [facility] [place] [address]  [address] [place], [place] [address] [phone] (Main) Mon 8:00am - 5:00pm; Tue 8:00am - 5:00pm; Wed 8:00am - 5:00pm; Thu 8:00am - 5:00pm; Fri 8:00am - 12:00pm; Website: http://[email or web address].salvationarmy.org/[place]/emergency-financial-assistance Email: [email or web address] Call for eligibility information. Apply :Utilities Assistance: Visit office by 8:30am on 1st and 4th Monday of each month to pick up application. Rent and Mortgage Assistance: Visit office by 8:30am on 2nd and 3rd Monday of each month to pick up application. NOTE: If Monday falls on a holiday applications can be picked up the following Tuesday. Documents required will be listed on application.  [facility] [place] [phone] (Main) Seen by appointment only. Call for more information. Eligibility: Meet income limits. Apply: Call for information on how to schedule an appointment. Each month there is a specific day to call to schedule an appointment. It is stated on the agency voicemail message. Appointments fill up quickly each month. Documents: Photo ID, copy of current utility bill.  [facility] [place] [address], [place], [place] [address] [phone] (Main) Tue 9:00am - 4:00pm; Wed 9:00am - 4:00pm; Thu 9:00am - 4:00pm; Website: http://www.[email or web address] Email: [email or web address] [email or web address] Eligibility: Utility Assistance: Meet income limits and be a Holiday representative. [employer] customers do not qualify. Must not have received utility assistance for another agency within the last 90 days. Rent Assistance: Residents of [employer] who meet income limits. Must not have received rent assistance for another agency within the last 90 days. Apply: Call to schedule an appointment. Documents: Utility Assistance: Photo ID, City  of [employer], copy of lease (if not paying a mortgage), proof of income, and monthly expenses. Rent Assistance: Photo ID, W-9 from the landlord, copy of the lease, proof of income, and a list of monthly expenses.  [facility] [place] [address], [place], [place] [address] [phone] (Main: Help With Rent) [phone] (Main: Help With Utilities) Mon 9:00am - 4:00pm; Tue 9:00am - 4:00pm; Wed 9:00am - 4:00pm; Thu 9:00am - 4:00pm; Fri 9:00am - 4:00pm; Website: [email or web address].no Email: [email or web address] Eligibility: People experiencing a financial crisis. Apply: Call to schedule an appointment Wednesday, 7:30am. Documents: Photo ID, Social Security card, proof of income, and proof of address. Other documents may be required, depending on service. Call for more information.  LOW INCOME ENERGY ASSISTANCE PROGRAM DEPARTMENT OF SOCIAL SERVICES - [facility] [address], [place], [place] [address] [phone] (Main) Mon 8:00am - 5:00pm; Tue 8:00am - 5:00pm; Wed 8:00am - 5:00pm; Thu 8:00am - 5:00pm; Fri 8:00am - 5:00pm; Website: [email or web address] Eligibility: Meet income limits and resource guidelines. Each household is only eligible once, even if multiple members apply. Apply: Call to see if funds are available. Visit to complete an application, call to have 1 mailed, or apply online at [email or web address] NOTE: Households with a person age 109 and over or a person with a documented disability can apply beginning December 1. Other households can apply beginning January 1. Documents: Photo ID, birth certificate, proof of household income, copy of utility bill, latest bank statement, the names and Social Security numbers for everyone in the household, and proof of disability if under age 29.  LOW INCOME ENERGY ASSISTANCE  PROGRAM DEPARTMENT OF SOCIAL SERVICES - [facility] [address] [place], [place], [place] [address] [phone] (Main) Mon 8:00am - 5:00pm; Tue 8:00am - 5:00pm; Wed 8:00am - 5:00pm; Thu 8:00am - 5:00pm; Fri 8:00am - 5:00pm; Website: [email or web address] Eligibility: Meet income limits and resource  guidelines. Each household is only eligible once, even if multiple members apply. Apply: Call to see if funds are available. Visit to complete an application, call to have 1 mailed, or apply online at [email or web address] NOTE: Households with a person age 28 and over or a person with a documented disability can apply beginning December 1. Other households can apply beginning January 1. Documents: Photo ID, birth certificate, proof of household income, copy of utility bill, latest bank statement, the names and Social Security numbers for everyone in the household, and proof of disability if under age 29.  [employer] assistance programs Crisis assistance programs  -[facility] Homelessness Arts development officer. If you are experiencing homelessness in [place], [place] , your first point of contact should be Pensions consultant. You can reach Coordinated Entry by calling [phone] or by emailing coordinatedentry@partnersendinghomelessness .org.  Community access points: [employer] [phone], [place]) every Tuesday from 9am-10am. [facility] ([address], [place]) every Wednesday from 8am-9am.   -[place] Coordinated Re-entry [name]: Dial 211 and request. Offers referrals to homeless shelters in the area.    -The [employer] [phone]) offers several services to local families, as funding allows. The Emergency Assistance Program (EAP), which they administer, provides household goods, free food, clothing, and financial aid to people in need in the [facility] area. The EAP program does have some qualification, and counselors will interview clients for financial assistance by written referral only. Referrals need to be made by the Department of Social Services or by other EAP approved human services agencies or charities in the area.  -[facility] Ministries of [employer], which can be reached at [phone], offers emergency assistance programs for those in need of help, such as food, rent assistance, a soup kitchen, shelter, and clothing. They are based in [facility]  but provide a number of services to those that qualify for assistance.   The [facility] Department of Social Services may be able to offer temporary financial assistance and cash grants for paying rent and utilities, Help may be provided for local county residents who may be experiencing personal crisis when other resources, including government programs, are not available. Call [phone]  -High [employer] is a [employer] agency, The organization can offer emergency assistance for paying rent, Care[employer] Rx, utilities, food, household products and furniture. They offer extensive emergency and transitional housing for families, children and single women, and also run a Boy's and [employer]. Thrift Shops, Secondary school teacher, and other aid offered too. [address] [place], [place]  [address], [phone]  -[place] Low Income Energy Assistance Program -- This is offered for [facility] families. The federal government created [employer] Program provides a one-time cash grant payment to help eligible low-income families pay their electric and heating bills. [address], [place], [place]  [address], [phone]  -[place] Emergency Assistance -- A program offers emergency utility and rent funds for greater [place] area residents. The program can also provide counseling and referrals to  charities and government programs. Also provides food and a free meal program that serves lunch Mondays - Saturdays and dinner seven days per week to individuals in the community. [address] [place], [place]  [address], [phone]  -[employer] - Offers affordable apartment and housing communities across      [place] and [place]. The low income and seniors  can access public housing, rental assistance to qualified applicants, and apply for the section 8 rent subsidy program. Other programs include Chiropractor and Engineer, maintenance. [address], [place], [place]  [address], dial [phone].  -The [facility] provides transitional housing to veterans and the disabled. Clients will also access other services too, including assistance in applying for Disability, life skills classes, case management, and assistance in finding permanent housing. [address] [place], [place]  [address], call [phone]  -[place] Transitional Housing through [employer] is for people who were just evicted or that are formerly homeless. The non-profit will also help then gain self-sufficiency, find a home or apartment to live in, and also provides information on rent assistance when needed. Phone [phone]  -The [place] [facility] [facility] helps low income, elderly, or disabled residents in seven counties in the [place] ([place], [place], [place], [place], [place], [place], [place], and [place]) save energy and reduce their utility bills by improving energy efficiency. Phone [phone].  -[employer] is located in the [place] [facility] in the [employer], [address], [place], [place] [address]. Parking is in the rear of the building. Phone: [phone]   General Email:  info@gsohc .org  GHC provides free housing counseling assistance in locating affordable rental housing or housing with support services for families and individuals in crisis and the chronically homeless. We provide potential resources for other housing needs like utilities. Our trained counselors also work with clients on budgeting and financial literacy in effort to empower them to take control of their financial situations. [employer] collaborates with homeless service providers and other stakeholders as part of the [employer] COC (Continuum of Care). The (COC) is a regional/local planning body that coordinates housing and services funding for homeless families and individuals. The role of [facility] in the COC is through housing counseling to work with people we serve on diversion strategies for those that are at imminent risk of becoming homeless. We also work with the Coordinated Assessment/Entry Specialist who attempts to find temporary solutions and/or connects the people to Housing First, Rapid Re-housing or transitional housing programs. Our Homelessness Prevention Housing Counselors meet with clients on business days (Monday-Fridays, except scheduled holidays) from 8:30 am to 4:30 pm.  Legal assistance for evictions, foreclosure, and more -If you need free legal advice on civil issues, such as foreclosures, evictions, Electronics engineer, government programs, domestic issues and more, Landscape architect of [place]  [facility]) is a Associate Professor firm that provides free legal services and counsel to lower income people, seniors, disabled, and others, The goal is to ensure everyone has access to justice and fair representation. Call them at [phone].  [facility] and Community Studies can provide info about obtaining legal assistance with evictions. Phone [phone].  Data processing manager  The [employer], [employer]. offers job and Dispensing optician.  Resources are focused on helping students obtain the skills and experiences that are necessary to compete in today's challenging and tight job market. The non-profit faith-based community action agency offers internship trainings as well as classroom instruction. Classes are tailored to meet the needs of people in the [facility] region. [place], [place] [address], [phone]  Foreclosure prevention/Debt Services Family Services of the [employer] Credit Counseling Service inludes debt and foreclosure prevention programs for local families. This includes money management, financial advice, budget review and development of a written action plan with a nationally  certified Publishing rights manager to help solve specific individual financial problems. In addition, housing and mortgage counselors can also provide pre- and post-purchase homeownership counseling, default resolution counseling (to prevent foreclosure) and reverse mortgage counseling. A Debt Management Program allows people and families with a high level of credit card or medical debt to consolidate and repay consumer debt and loans to creditors and rebuild positive credit ratings and scores. Contact [phone] F1555895.  Community clinics in [place] -Health Department [facility]: [address] [place], [place], [address]. [phone].  -Health Department [place] [facility]: [address], [facility], [address]. [phone].  -[facility] offers medical care through a group of doctors, pharmacies and other healthcare related agencies that offer services for low income, uninsured adults in [place]. Also offers adult Dental care and assistance with applying for an [employer]. Call [phone].   [facility]. This center provides low-cost health care to those without health insurance. Services offered include an onsite pharmacy. Phone [phone].  [address], [place].  -Medication Assistance Program serves as a link between pharmaceutical companies and patients to provide low cost or free prescription medications. This service is available for residents who meet certain income restrictions and have no insurance coverage. PLEASE CALL [phone] Jonette Nestle) OR [phone] ([place])  -One Step Further: Materials engineer, The [employer] Support & Nutrition Program, [employer]. Call [phone]/ [phone].  Food pantry and assistance -Urban Ministry-Food Bank: [address], [place] [address]. Phone [phone]  -[facility] Food [facility]ry: [address] [place], [place] [address]. [phone].  -Missionary Ministry: has the purpose of visiting the sick and shut-ins and provide for needs in the surrounding communities. Call [phone]. Email: [email or web address] [email or web address] This program provides: Food box for seniors, Financial assistance, Food to meet basic nutritional needs.  -Meals on Wheels with Senior Resources: [facility] residents age 40 and over who are homebound and unable to obtain and prepare a nutritious meal for themselves are eligible for this service. There may be a waiting list in certain parts of [facility] if the route in that area is full. If you are in [facility] and [place] call [phone] to register. For all other areas call [phone] to register.  -Greater Dietitian: https://findfood.BargainContractor.si  TRANSPORTATION: -[employer] Department of Health: Call [facility] and [employer] at [phone] for details. AttractionGuides.es  -Access [place]: Access [place] is the [employer] Agency's shared-ride transportation service for eligible riders who have a disability that prevents them from riding the fixed route bus.  Call [phone]. Access [place] riders must pay a fare of $1.50 per trip, or may purchase a 10-ride punch card for $14.00 ($1.40 per ride) or a 40-ride punch card for $48.00 ($1.20 per ride).  -The Shepherd's WHEELS rideshare transportation service is provided for senior citizens (60+) who live independently within [place] city limits and are unable to drive or have limited access to transportation. Call [phone] to schedule an appointment.  -[place] Transportation: For Medicare or Medicaid recipients call [phone]?[facility], wheelchair Carloyn Chi, and ambulatory quotes available.   FLEEING VIOLENCE: -[facility] the [place]- 24/7 Crisis line [phone]) -[facility]: [phone]) [phone])  [place]ence 2-1-1 is another useful way to locate resources in the community. Visit ShedSizes.ch to find service information online. If you need additional assistance, 2-1-1 Referral Specialists are available 24 hours a day,  every day by dialing 2-1-1 or [phone] from any phone. The call is free, confidential, and available in any language.  Affordable Housing Search http://www.nchousingsearch.[facility])   M-F 8a-3p [address]  [place], [place] [address] [phone] Services include: laundry, barbering, support groups, case management, phone & computer access, showers, AA/NA mtgs, mental health/substance abuse nurse, job skills class, disability information, VA assistance, spiritual classes, etc. Winter Shelter available when temperatures are less than 32 degrees.   HOMELESS SHELTERS [facility] at [facility]- Call [phone] ext. [phone] or ext. [phone]. Located at [address], [place], [place] [address]  [facility]- Call [phone]. Located at [address] [place] [address].  [facility] [employer]. Call [phone]. Office located at [address] [place] [address].  [facility] through [place] [phone].  [facility]- Call [phone]. Located at [address] [place], [place], [place] [address].  [facility]. Call [phone]. Located at [address] [place], [address].  [facility] in [place]. Call [phone]. [facility] in [place]. Call [phone].  [facility] of [employer] for [employer] [phone]. Office located at [address], [place], [address].  [employer] be agreeable to help with chores. Call [phone] ext. [phone].  Men's: [address] [place], [place] [address]. Women's: [facility]  [address] [place], [place] [address]  Crisis [facility] Mobile Crisis Management- [phone]  [facility] [address], [place], [place] [address]. Phone: [phone]  Information on my medicine - ELIQUIS  (apixaban )  This medication education was reviewed with me or my healthcare representative as part of my discharge preparation.    Why was Eliquis  prescribed for you? Eliquis  was prescribed for you to reduce the risk of a blood clot forming that can cause a stroke if you have a medical condition called atrial fibrillation (a type of irregular heartbeat).  What do You need to know about Eliquis  ? Take your Eliquis  TWICE DAILY - one tablet in the morning and one tablet in the evening with or without food. If you have difficulty swallowing the tablet whole please discuss with your pharmacist how to take the medication safely.  Take Eliquis  exactly as prescribed by your doctor and DO NOT stop taking Eliquis  without talking to the doctor who prescribed the medication.  Stopping may increase your risk of developing a stroke.  Refill your prescription before you run out.  After discharge, you should have  regular check-up appointments with your healthcare provider that is prescribing your Eliquis .  In the future your dose may need to be changed if your kidney function or weight changes by a significant amount or as you get older.  What do you do if you miss a dose? If you miss a dose, take it as soon as you remember on the same day and resume taking twice daily.  Do not take more than one dose of ELIQUIS  at the same time to make up a missed dose.  Important Safety Information A possible side effect of Eliquis  is bleeding. You should call your healthcare provider right away if you experience any of the following: Bleeding from an injury or your nose that does not stop. Unusual colored urine (red or dark brown) or unusual colored stools (red or black). Unusual bruising  for unknown reasons. A serious fall or if you hit your head (even if there is no bleeding).  Some medicines may interact with Eliquis  and might increase your risk of bleeding or clotting while on Eliquis . To help avoid this, consult your healthcare provider or pharmacist prior to using any new prescription or non-prescription medications, including herbals, vitamins, non-steroidal anti-inflammatory drugs (NSAIDs) and supplements.  This website has more information on Eliquis  (apixaban ): http://[email or web address] .com/eliquis Romaine Closs    Mr Rayland, Hamed were hospitalized for shortness of breath due to volume overload and bacteremia.  After receiving multiple sessions of HD, your breathing is back to normal.  We have also begun treatment of your bacteremia with vancomycin , which you can continue at your HD sessions for the next several weeks.  I feel comfortable discharging you from the hospital at this time. Thank you for allowing us  to be part of your care.   Our internal medicine clinic will be in touch to schedule follow-up appointment with you  Please note these changes made to your medications:   *Please START taking:  Fentanyl   patch every 3 days.  I have given you 5 patches, so you will need to attend your PCP appointment to have this refilled.  Please make sure to return to the hospital if you are having worsening shortness of breath, chest pain, fever, or pass out.  Please call our clinic if you have any questions or concerns, we may be able to help and keep you from a long and expensive emergency room wait. Our clinic and after hours phone number is [phone], the best time to call is Monday through Friday 9 am to 4 pm but there is always someone available 24/7 if you have an emergency. If you need medication refills please notify your pharmacy one week in advance and they will send us  a request.

## 2023-11-02 NOTE — TOC Initial Note (Addendum)
 Transition of Care [facility]) - Initial/Assessment Note    Patient Details  Name: Phillip Young MRN: [id number] Date of Birth: August 14, 1985  Transition of Care [facility]) CM/SW Contact:    Marliss Coots, LCSW Phone Number: 11/02/2023, 4:21 PM  Clinical Narrative:                  4:21 PM CSW and MSW intern, Hospital doctor, introduced selves and roles to patient at bedside. Patient consented to speak to CSW in front of MSW intern. Patient confirmed he resides in an apartment with his mother. Patient confirmed he receives disability but not food stamps. CSW followed up on SDOH needs (housing, food, transportation, utilities). Patient confirmed needs and expressed interest in resources. CSW printed resources (food stamps application, list of food pantries, rent/utility financial assistance resources, transportation resources packet) and provided them to patient. CSW informed patient of transportation options for discharge (taxi voucher/bus passes) and of how to obtain them (ask bedside RN) at discharge. Patient expressed understanding of the information. CSW added SDOH resources to AVS, as well.  Expected Discharge Plan: Home/Self Care Barriers to Discharge: Continued Medical Work up   Patient Goals and CMS Choice Patient states their goals for this hospitalization and ongoing recovery are:: to return home          Expected Discharge Plan and Services       Living arrangements for the past 2 months: Apartment                                      Prior Living Arrangements/Services Living arrangements for the past 2 months: Apartment Lives with:: Parents Patient language and need for interpreter reviewed:: Yes Do you feel safe going back to the place where you live?: Yes            Criminal Activity/Legal Involvement Pertinent to Current Situation/Hospitalization: No - Comment as needed  Activities of Daily Living   ADL Screening (condition at time of admission) Independently performs  ADLs?: No Does the patient have a NEW difficulty with bathing/dressing/toileting/self-feeding that is expected to last >3 days?: No (needs assist) Does the patient have a NEW difficulty with getting in/out of bed, walking, or climbing stairs that is expected to last >3 days?: No (needs assist) Does the patient have a NEW difficulty with communication that is expected to last >3 days?: No Is the patient deaf or have difficulty hearing?: No Does the patient have difficulty seeing, even when wearing glasses/contacts?: No Does the patient have difficulty concentrating, remembering, or making decisions?: No  Permission Sought/Granted Permission sought to share information with : Family Supports Permission granted to share information with : No (Contact information on chart)  Share Information with NAME: [name]     Permission granted to share info w Relationship: Mother  Permission granted to share info w Contact Information: [phone]  Emotional Assessment Appearance:: Appears stated age Attitude/Demeanor/Rapport: Engaged Affect (typically observed): Accepting, Appropriate, Adaptable, Calm, Stable Orientation: : Oriented to Self, Oriented to Place, Oriented to  Time, Oriented to Situation Alcohol / Substance Use: Not Applicable Psych Involvement: No (comment)  Admission diagnosis:  Lactic acidosis [E87.20] Acute on chronic hypoxic respiratory failure ([name]) [J96.21] Patient Active Problem List   Diagnosis Date Noted   Sacral wound 10/12/2023   Chronic, continuous use of opioids 10/06/2023   Acute encephalopathy 09/22/2023   ESRD (end stage renal disease) ([name]) 09/10/2023   Paronychia  of great toe of right foot 09/09/2023   Ingrown nail of great toe of right foot 09/09/2023   Ingrown toenail of right foot 09/08/2023   Atrial fibrillation with RVR ([name]) 09/07/2023   Encounter for screening involving social determinants of health (SDoH) 08/30/2023   Weakness of right foot  08/25/2023   ESRD needing dialysis ([name]) 07/02/2023   Hoarseness of voice 06/21/2023   Adjustment disorder with mixed anxiety and depressed mood 06/10/2023   Metabolic encephalopathy 06/07/2023   Severe muscle deconditioning 06/03/2023   HFrEF (heart failure with reduced ejection fraction) ([name]) 06/01/2023   Neuropathy 05/28/2023   MSSA bacteremia 05/26/2023   Hyperkalemia 03/21/2023   Central line infection 03/10/2023   Pressure injury of skin 01/23/2023   Acute hypoxic respiratory failure ([name]) 01/21/2023   Chronic a-fib ([name]) 01/21/2023   Acute on chronic hypoxic respiratory failure ([name]) 08/07/2022   Congestive heart failure ([name]) 08/05/2022   Elevated liver enzymes 06/08/2022   Multiple nodules of lung 05/31/2022   History of Gram positive sepsis    Advanced care planning/counseling discussion    Recurrent infections 09/30/2021   Reactive depression 09/02/2021   Hypervolemia 08/18/2021   Type 2 diabetes mellitus with diabetic chronic kidney disease ([name]) 06/26/2021   ESRD on dialysis [facility])    Medical non-compliance 04/21/2021   Hypotension 04/21/2021   ESRD (end stage renal disease) on dialysis ([name]) 04/16/2021   Paroxysmal atrial flutter ([name]) 04/12/2021   SOB (shortness of breath)    Cardiogenic shock ([name])    Chronic respiratory failure with hypoxia ([name]) 02/11/2021   Chronic systolic CHF (congestive heart failure) ([name])    Chronic cough    Morbid obesity ([name]) 02/26/2020   GERD without esophagitis 02/26/2020   OSA (obstructive sleep apnea) 02/26/2020   Hidradenitis suppurativa 02/26/2020   PCP:  [name], DO Pharmacy:   [id number] [place], [place] - [address] AT [facility] OF [address] [place] [address] Phone: [phone] Fax: [phone]  [facility] [address] [place] [address] Phone: [phone] Fax: [phone]  [facility] [address] [place] [address] Phone: [phone] Fax: [phone]  [facility] [place], [place] - [address] [place] [address] Phone: [phone] Fax: [phone]     Social Drivers of Health (SDOH) Social History: SDOH Screenings   Food Insecurity: Food Insecurity Present (11/01/2023)  Housing: High Risk (11/01/2023)  Transportation Needs: Unmet Transportation Needs (11/01/2023)  Utilities: At Risk (11/01/2023)  Alcohol Screen: Low Risk  (10/06/2023)  Depression (PHQ2-9): High Risk (10/06/2023)  Financial Resource Strain: Low Risk  (10/06/2023)  Physical Activity: Insufficiently Active (10/06/2023)  Social Connections: Socially Isolated (10/06/2023)  Stress: No Stress Concern Present (10/06/2023)  Tobacco Use: Medium Risk (11/01/2023)  Health Literacy: Adequate Health Literacy (10/06/2023)   SDOH Interventions: Food Insecurity Interventions: [employer] Provided, Inpatient TOC Housing Interventions: [employer] Provided, Inpatient [employer] Transportation Interventions: [employer] Provided, Inpatient TOC Utilities Interventions: Community Resources Provided, Inpatient TOC   Readmission Risk Interventions    10/13/2023   11:27 AM 09/23/2023    5:05 PM 07/05/2023    4:25 PM  Readmission Risk Prevention Plan  Transportation Screening Complete Complete Complete  HRI or Home Care Consult   Complete  Social Work Consult for Recovery Care Planning/Counseling   Complete  Palliative Care Screening   Not Applicable  Medication  Review Oceanographer) Referral to Pharmacy Referral to Pharmacy Complete  PCP or Specialist appointment within 3-5 days of discharge  Complete   HRI or Home Care Consult Complete Complete   SW Recovery Care/Counseling Consult Complete Complete   Palliative Care Screening Not Applicable Not Applicable   Skilled Nursing Facility Not Applicable Not Applicable

## 2023-11-02 NOTE — Progress Notes (Signed)
 Pt receives out-pt HD at [facility] on MWF 11:55 am chair time. Will assist as needed.   Olivia Canter Renal Navigator [phone]

## 2023-11-03 ENCOUNTER — Other Ambulatory Visit (HOSPITAL_COMMUNITY)

## 2023-11-03 DIAGNOSIS — I482 Chronic atrial fibrillation, unspecified: Secondary | ICD-10-CM | POA: Diagnosis not present

## 2023-11-03 DIAGNOSIS — J9621 Acute and chronic respiratory failure with hypoxia: Secondary | ICD-10-CM | POA: Diagnosis not present

## 2023-11-03 DIAGNOSIS — B952 Enterococcus as the cause of diseases classified elsewhere: Secondary | ICD-10-CM | POA: Diagnosis present

## 2023-11-03 DIAGNOSIS — R7881 Bacteremia: Secondary | ICD-10-CM | POA: Diagnosis not present

## 2023-11-03 DIAGNOSIS — B957 Other staphylococcus as the cause of diseases classified elsewhere: Secondary | ICD-10-CM | POA: Diagnosis present

## 2023-11-03 DIAGNOSIS — L8990 Pressure ulcer of unspecified site, unspecified stage: Secondary | ICD-10-CM | POA: Diagnosis present

## 2023-11-03 DIAGNOSIS — Z1629 Resistance to other single specified antibiotic: Secondary | ICD-10-CM | POA: Diagnosis present

## 2023-11-03 DIAGNOSIS — F4323 Adjustment disorder with mixed anxiety and depressed mood: Secondary | ICD-10-CM | POA: Diagnosis not present

## 2023-11-03 DIAGNOSIS — Z9189 Other specified personal risk factors, not elsewhere classified: Secondary | ICD-10-CM

## 2023-11-03 LAB — CBC
HCT: 31.4 % — ABNORMAL LOW (ref 39.0–52.0)
Hemoglobin: 10.1 g/dL — ABNORMAL LOW (ref 13.0–17.0)
MCH: 32.9 pg (ref 26.0–34.0)
MCHC: 32.2 g/dL (ref 30.0–36.0)
MCV: 102.3 fL — ABNORMAL HIGH (ref 80.0–100.0)
Platelets: 184 10*3/uL (ref 150–400)
RBC: 3.07 MIL/uL — ABNORMAL LOW (ref 4.22–5.81)
RDW: 16.4 % — ABNORMAL HIGH (ref 11.5–15.5)
WBC: 6.9 10*3/uL (ref 4.0–10.5)
nRBC: 0 % (ref 0.0–0.2)

## 2023-11-03 LAB — RENAL FUNCTION PANEL
Albumin: 3 g/dL — ABNORMAL LOW (ref 3.5–5.0)
Anion gap: 18 — ABNORMAL HIGH (ref 5–15)
BUN: 49 mg/dL — ABNORMAL HIGH (ref 6–20)
CO2: 20 mmol/L — ABNORMAL LOW (ref 22–32)
Calcium: 9.3 mg/dL (ref 8.9–10.3)
Chloride: 92 mmol/L — ABNORMAL LOW (ref 98–111)
Creatinine, Ser: 9.06 mg/dL — ABNORMAL HIGH (ref 0.61–1.24)
GFR, Estimated: 7 mL/min — ABNORMAL LOW (ref >60)
Glucose, Bld: 133 mg/dL — ABNORMAL HIGH (ref 70–99)
Phosphorus: 6.7 mg/dL — ABNORMAL HIGH (ref 2.5–4.6)
Potassium: 4.1 mmol/L (ref 3.5–5.1)
Sodium: 130 mmol/L — ABNORMAL LOW (ref 135–145)

## 2023-11-03 LAB — HEPATITIS B SURFACE ANTIGEN: Hepatitis B Surface Ag: NONREACTIVE

## 2023-11-03 MED ORDER — LIDOCAINE-PRILOCAINE 2.5-2.5 % EX CREA: 1.0000 | TOPICAL_CREAM | CUTANEOUS | Status: DC | PRN

## 2023-11-03 MED ORDER — ANTICOAGULANT SODIUM CITRATE 4% (200MG/5ML) IV SOLN
5.0000 mL | Status: DC | PRN
  Filled 2023-11-03: qty 5

## 2023-11-03 MED ORDER — ANTICOAGULANT SODIUM CITRATE 4% (200MG/5ML) IV SOLN
5.0000 mL | Status: AC
  Filled 2023-11-03: qty 5

## 2023-11-03 MED ORDER — ANTICOAGULANT SODIUM CITRATE 4% (200MG/5ML) IV SOLN
5.0000 mL | Freq: Once | Status: DC
  Administered 2023-11-03: 5 mL
  Filled 2023-11-03: qty 5

## 2023-11-03 MED ORDER — LIDOCAINE HCL (PF) 1 % IJ SOLN
5.0000 mL | INTRAMUSCULAR | Status: DC | PRN
Start: 2023-11-03 — End: 2023-11-03

## 2023-11-03 MED ORDER — PENTAFLUOROPROP-TETRAFLUOROETH EX AERO: 1.0000 | INHALATION_SPRAY | CUTANEOUS | Status: DC | PRN

## 2023-11-03 NOTE — Progress Notes (Addendum)
                  Subjective:   Summary: Phillip Young is a 38 yo male with chronic hypoxic respiratory failure on 2-4L home O2, HFrEF w/LVEF <20%, Afib on Eliquis, chronic pain, ESRD on HD MWF who presented to the ED with shortness of breath. He is being treated for volume overload due to missed HD session as well as E faecalis and MRSE bacteremia  Patient seen in HD. He was feeling frustrated at the health care staff attempting to enforce his fluid restriction. Patient was educated on respectful language.   Objective:  Vital signs in last 24 hours: Vitals:   11/03/23 1033 11/03/23 1100 11/03/23 1118 11/03/23 1143  BP: (!) 72/50   (!) 114/57  Pulse: (!) 110 (!) 106 (!) 123 97  Resp: 18 (!) 25 (!) 24   Temp:    (!) 97.5 F (36.4 C)  TempSrc:    Oral  SpO2: 100% 99% 100% 100%  Weight:      Height:       Supplemental O2: Nasal Cannula SpO2: 100 % O2 Flow Rate (L/min): 6 L/min  Physical Exam:  Constitutional: Seen in HD-lying in bed connected to treatment.  Chronically ill appearing, very upset and expressed that he would not be participating in this morning's interview due to heightened emotions. Neuro: A&O x 3  Assessment/Plan:   Acute on chronic hypoxic respiratory failure  Hx presumed amiodarone toxicity HFrEF, LVEF <20% ESRD on HD Patient continues to require 5L [place] to maintain appropriate O2 saturations, though his dyspnea and lower extremity edema are subjectively improved after HD x 1.  - HD today per nephrology  Enterococcus faecalis and MRSE bacteremia Blood cultures positive for Enterococcus faecalis and staph epidermitis. Staph species favored to be contamination, E. Faecalis is likely real growth. Patient is relatively asymptomatic with no fever, leukocytosis or other systemic symptoms.  - ID following, appreciate recommendations - Vancomycin per pharmacy  - TEE ordered to evaluate for endocarditis - Coordinate with nephrology to remove R tunneled chest  catheter followed by 2-3 day line holiday. Catheter tip to be sent for semiquantitative culture - Repeat blood cultures after line removal  Chronic somatic and neuropathic pain Continue venlafaxine 37.5mg  daily, lyrica 50mg  daily, oxycodone 10mg  q6h prn, Sarna lotion  Anemia of chronic disease: Hgb stable, no evidence of acute blood loss Paroxysmal atrial fibrillation: Continue home Eliquis 5mg  BID, Digoxin 0.125mg  MWF Hypothyroidism: Continue home levothyroxine . Depression: Continue home venlafaxine 37.5mg   Diet: Renal VTE: Eliquis Code: Full  Dispo: Anticipated discharge to Home pending medical stability.   Monna Fam, MD PGY-1 Internal Medicine Resident Pager Number [phone] Please contact the on call pager after 5 pm and on weekends at [phone].

## 2023-11-03 NOTE — Progress Notes (Signed)
 [facility]  Date of Admission:  11/01/2023     Lines:  Right chest tunneled cath   Abx: 4/8-c vanc   Assessment: 38 yo male esrd on ihd via right chest tunneled cath, hx mssa bsi (03/2021 with paraspinal abscess, lef sacral om, left si joint septic arthritis; 04/2023 secondary to catheter not able to do tee s/p 6 weeks cefazolin), admitted 4/8 for 1 week malaise found to have sepsis and e faecalis / mrse bsi                                       4/8 bcx gpc (bcid e faecalis and staph epi -- mrse) 4/9 repeat bcx in progress   No focal joint pain   Suspect right chest catheter could be seeded; will need to r/o e faecalis / mrse bsi as abx management would change      ---------- 11/03/23 id assessment High risk tee and cards recommending tte Bcx repeat in progress Renal planning line removal tomorrow and line holiday  Clinically volume overloaded but no focal pain outside of bilateral generalized distal lower ext pain/tenderness     Plan: F/u tte F/u repeat bcx Continue vanc for now Appreciate cardiology management Appreciate renal helping with line management Please get blood culture after line is removed, on the day of removal; please also send catheter tip for semiquantitative cx Standard isolation precaution Discussed with primary team     Principal Problem:   Enterococcal bacteremia Active Problems:   Chronic heart failure with reduced ejection fraction (HFrEF, <= 40%) ([name])   Chronic hyponatremia   ESRD (end stage renal disease) on dialysis ([name])   Hypotension   Hypervolemia   Acute on chronic hypoxic respiratory failure ([name])   Atrial fibrillation, chronic ([name])   Adjustment disorder with mixed anxiety and depressed mood   Chronic, continuous use of opioids   Sacral wound   At risk for central line-associated bloodstream infection (CLABSI)   Bacteremia due to methicillin resistant Staphylococcus epidermidis   Pressure  injury of skin   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, SRA positive   Metoprolol Other (See Comments)    near syncope   Amoxicillin Other (See Comments)    Was hospitalized    Other Swelling and Other (See Comments)    Steroids Fluid seeping out of legs    Tramadol     Scheduled Meds:  apixaban  5 mg Oral BID   Chlorhexidine Gluconate Cloth  6 each Topical Q0600   cinacalcet  30 mg Oral Q M,W,F-HD   digoxin  0.125 mg Oral Q M,W,F   levothyroxine  25 mcg Oral Q0600   midodrine  30 mg Oral TID WC   pregabalin  50 mg Oral Daily   sevelamer carbonate  1,600 mg Oral TID WC   venlafaxine XR  37.5 mg Oral Daily   Continuous Infusions:  anticoagulant sodium citrate     vancomycin 200 mL/hr at 11/03/23 1500   PRN Meds:.camphor-menthol, oxyCODONE   SUBJECTIVE: No new complaint   Review of Systems: ROS All other ROS was negative, except mentioned above  OBJECTIVE: Vitals:   11/03/23 1033 11/03/23 1100 11/03/23 1118 11/03/23 1143  BP: (!) 72/50   (!) 114/57  Pulse: (!) 110 (!) 106 (!) 123 97  Resp: 18 (!) 25 (!) 24   Temp:    (!) 97.5 F (36.4 C)  TempSrc:    Oral  SpO2: 100% 99% 100% 100%  Weight:      Height:       Body mass index is 57.8 kg/m.  Physical Exam General/constitutional: no distress, pleasant HEENT: Normocephalic, PER, Conj Clear, EOMI, Oropharynx clear Neck supple CV: rrr no mrg Lungs: clear to auscultation, normal respiratory effort Abd: Soft, Nontender Ext: 1-2 plus bilateral LE edema Skin: hyperpigmented papular macular rash trunk/ext Neuro: nonfocal MSK: no peripheral joint swelling/tenderness/warmth; back spines nontender   Central line presence: chest cath site no purulence/erythema   Lab Results Lab Results   Component Value Date   WBC 6.9 11/03/2023   HGB 10.1 (L) 11/03/2023   HCT 31.4 (L) 11/03/2023   MCV 102.3 (H) 11/03/2023   PLT 184 11/03/2023    Lab Results  Component Value Date   CREATININE 9.06 (H) 11/03/2023   BUN 49 (H) 11/03/2023   NA 130 (L) 11/03/2023   K 4.1 11/03/2023   CL 92 (L) 11/03/2023   CO2 20 (L) 11/03/2023    Lab Results  Component Value Date   ALT 16 11/01/2023   AST 19 11/01/2023   ALKPHOS 208 (H) 11/01/2023   BILITOT 1.0 11/01/2023      Microbiology: Recent Results (from the past 240 hours)  Blood Culture (routine x 2)     Status: Abnormal (Preliminary result)   Collection Time: 11/01/23  9:37 AM   Specimen: BLOOD RIGHT HAND  Result Value Ref Range Status   Specimen Description BLOOD RIGHT HAND  Final   Special Requests   Final    BOTTLES DRAWN AEROBIC AND ANAEROBIC Blood Culture adequate volume   Culture  Setup Time   Final    GRAM POSITIVE COCCI IN BOTH AEROBIC AND ANAEROBIC BOTTLES CRITICAL RESULT CALLED TO, READ BACK BY AND VERIFIED WITH: PHARMD J LEDFORD 11/02/2023 @ 0420 BY AB    Culture (A)  Final    ENTEROCOCCUS FAECALIS STAPHYLOCOCCUS EPIDERMIDIS THE SIGNIFICANCE OF ISOLATING THIS ORGANISM FROM A SINGLE SET OF BLOOD CULTURES WHEN MULTIPLE SETS ARE DRAWN IS UNCERTAIN. PLEASE NOTIFY THE MICROBIOLOGY DEPARTMENT WITHIN ONE WEEK IF SPECIATION AND SENSITIVITIES ARE REQUIRED. SUSCEPTIBILITIES TO FOLLOW Performed at [facility], [address] [place], [place] [address]    Report Status PENDING  Incomplete  Resp panel by RT-PCR (RSV, Flu A&B, Covid) Anterior Nasal Swab     Status: None   Collection Time: 11/01/23  9:37 AM   Specimen: Anterior Nasal Swab  Result Value Ref Range Status   SARS Coronavirus 2 by RT PCR NEGATIVE NEGATIVE Final   Influenza A by PCR NEGATIVE NEGATIVE Final   Influenza B by PCR NEGATIVE NEGATIVE Final    Comment: (NOTE) The Xpert Xpress SARS-CoV-2/FLU/RSV plus assay is intended as an aid in the diagnosis  of influenza from Nasopharyngeal swab specimens and should not be used as a sole basis for treatment. Nasal washings and aspirates are unacceptable for Xpert Xpress SARS-CoV-2/FLU/RSV testing.  Fact Sheet for Patients: BloggerCourse.com  Fact Sheet for Healthcare Providers: SeriousBroker.it  This test is not yet approved or cleared by the [place] FDA and has been authorized for detection and/or diagnosis of SARS-CoV-2 by FDA under an Emergency Use Authorization (  EUA). This EUA will remain in effect (meaning this test can be used) for the duration of the COVID-19 declaration under Section 564(b)(1) of the Act, 21 U.S.C. section 360bbb-3(b)(1), unless the authorization is terminated or revoked.     Resp Syncytial Virus by PCR NEGATIVE NEGATIVE Final    Comment: (NOTE) Fact Sheet for Patients: BloggerCourse.com  Fact Sheet for Healthcare Providers: SeriousBroker.it  This test is not yet approved or cleared by the [place] FDA and has been authorized for detection and/or diagnosis of SARS-CoV-2 by FDA under an Emergency Use Authorization (EUA). This EUA will remain in effect (meaning this test can be used) for the duration of the COVID-19 declaration under Section 564(b)(1) of the Act, 21 U.S.C. section 360bbb-3(b)(1), unless the authorization is terminated or revoked.  Performed at [facility] Lab, [address] [place], [place] [address]   Blood Culture ID Panel (Reflexed)     Status: Abnormal   Collection Time: 11/01/23  9:37 AM  Result Value Ref Range Status   Enterococcus faecalis DETECTED (A) NOT DETECTED Final    Comment: CRITICAL RESULT CALLED TO, READ BACK BY AND VERIFIED WITH: PHARMD J LEDFORD 11/02/2023 @ 0420 BY AB    Enterococcus Faecium NOT DETECTED NOT DETECTED Final   Listeria monocytogenes NOT DETECTED NOT DETECTED Final   Staphylococcus species  DETECTED (A) NOT DETECTED Final    Comment: CRITICAL RESULT CALLED TO, READ BACK BY AND VERIFIED WITH: PHARMD J LEDFORD 11/02/2023 @ 0420 BY AB    Staphylococcus aureus (BCID) NOT DETECTED NOT DETECTED Final   Staphylococcus epidermidis DETECTED (A) NOT DETECTED Final    Comment: Methicillin (oxacillin) resistant coagulase negative staphylococcus. Possible blood culture contaminant (unless isolated from more than one blood culture draw or clinical case suggests pathogenicity). No antibiotic treatment is indicated for blood  culture contaminants. CRITICAL RESULT CALLED TO, READ BACK BY AND VERIFIED WITH: PHARMD J LEDFORD 11/02/2023 @ 0420 BY AB    Staphylococcus lugdunensis NOT DETECTED NOT DETECTED Final   Streptococcus species NOT DETECTED NOT DETECTED Final   Streptococcus agalactiae NOT DETECTED NOT DETECTED Final   Streptococcus pneumoniae NOT DETECTED NOT DETECTED Final   Streptococcus pyogenes NOT DETECTED NOT DETECTED Final   A.calcoaceticus-baumannii NOT DETECTED NOT DETECTED Final   Bacteroides fragilis NOT DETECTED NOT DETECTED Final   Enterobacterales NOT DETECTED NOT DETECTED Final   Enterobacter cloacae complex NOT DETECTED NOT DETECTED Final   Escherichia coli NOT DETECTED NOT DETECTED Final   Klebsiella aerogenes NOT DETECTED NOT DETECTED Final   Klebsiella oxytoca NOT DETECTED NOT DETECTED Final   Klebsiella pneumoniae NOT DETECTED NOT DETECTED Final   Proteus species NOT DETECTED NOT DETECTED Final   Salmonella species NOT DETECTED NOT DETECTED Final   Serratia marcescens NOT DETECTED NOT DETECTED Final   Haemophilus influenzae NOT DETECTED NOT DETECTED Final   Neisseria meningitidis NOT DETECTED NOT DETECTED Final   Pseudomonas aeruginosa NOT DETECTED NOT DETECTED Final   Stenotrophomonas maltophilia NOT DETECTED NOT DETECTED Final   Candida albicans NOT DETECTED NOT DETECTED Final   Candida auris NOT DETECTED NOT DETECTED Final   Candida glabrata NOT DETECTED NOT  DETECTED Final   Candida krusei NOT DETECTED NOT DETECTED Final   Candida parapsilosis NOT DETECTED NOT DETECTED Final   Candida tropicalis NOT DETECTED NOT DETECTED Final   Cryptococcus neoformans/gattii NOT DETECTED NOT DETECTED Final   Methicillin resistance mecA/C DETECTED (A) NOT DETECTED Final    Comment: CRITICAL RESULT CALLED TO, READ BACK BY AND VERIFIED  WITH: PHARMD J LEDFORD 11/02/2023 @ 0420 BY AB    Vancomycin resistance NOT DETECTED NOT DETECTED Final    Comment: Performed at [facility], [address], [place], [place] [address]  Blood Culture (routine x 2)     Status: None (Preliminary result)   Collection Time: 11/01/23  9:42 AM   Specimen: BLOOD RIGHT FOREARM  Result Value Ref Range Status   Specimen Description BLOOD RIGHT FOREARM  Final   Special Requests   Final    BOTTLES DRAWN AEROBIC AND ANAEROBIC Blood Culture adequate volume   Culture   Final    NO GROWTH 2 DAYS Performed at [facility], [address], [place], [place] [address]    Report Status PENDING  Incomplete  Culture, blood (Routine X 2) w Reflex to ID Panel     Status: None (Preliminary result)   Collection Time: 11/02/23  5:14 AM   Specimen: BLOOD RIGHT ARM  Result Value Ref Range Status   Specimen Description BLOOD RIGHT ARM  Final   Special Requests   Final    BOTTLES DRAWN AEROBIC AND ANAEROBIC Blood Culture adequate volume   Culture   Final    NO GROWTH 1 DAY Performed at [facility], [address], [place], [place] [address]    Report Status PENDING  Incomplete  Culture, blood (Routine X 2) w Reflex to ID Panel     Status: None (Preliminary result)   Collection Time: 11/02/23  5:20 AM   Specimen: BLOOD LEFT ARM  Result Value Ref Range Status   Specimen Description BLOOD LEFT ARM  Final   Special Requests   Final    BOTTLES DRAWN AEROBIC AND ANAEROBIC Blood Culture adequate volume   Culture   Final    NO GROWTH 1 DAY Performed at [facility], [address], [place], [place] [address]    Report Status PENDING  Incomplete     Serology:   Imaging: If present, new imagings (plain films, ct scans, and mri) have been personally visualized and interpreted; radiology reports have been reviewed. Decision making incorporated into the Impression / Recommendations.   Raymondo Band, MD [facility] [phone] pager    11/03/2023, 4:25 PM

## 2023-11-03 NOTE — Plan of Care (Signed)
  Problem: Education: Goal: Knowledge of disease and its progression will improve Outcome: Progressing Goal: Individualized Educational Video(s) Outcome: Progressing   Problem: Fluid Volume: Goal: Compliance with measures to maintain balanced fluid volume will improve Outcome: Progressing   Problem: Health Behavior/Discharge Planning: Goal: Ability to manage health-related needs will improve Outcome: Progressing   Problem: Nutritional: Goal: Ability to make healthy dietary choices will improve Outcome: Progressing   Problem: Clinical Measurements: Goal: Complications related to the disease process, condition or treatment will be avoided or minimized Outcome: Progressing   Problem: Education: Goal: Knowledge of General Education information will improve Description: Including pain rating scale, medication(s)/side effects and non-pharmacologic comfort measures Outcome: Progressing   Problem: Health Behavior/Discharge Planning: Goal: Ability to manage health-related needs will improve Outcome: Progressing   Problem: Clinical Measurements: Goal: Ability to maintain clinical measurements within normal limits will improve Outcome: Progressing Goal: Will remain free from infection Outcome: Progressing Goal: Diagnostic test results will improve Outcome: Progressing Goal: Respiratory complications will improve Outcome: Progressing Goal: Cardiovascular complication will be avoided Outcome: Progressing   Problem: Activity: Goal: Risk for activity intolerance will decrease Outcome: Progressing   Problem: Nutrition: Goal: Adequate nutrition will be maintained Outcome: Progressing   Problem: Coping: Goal: Level of anxiety will decrease Outcome: Progressing   Problem: Elimination: Goal: Will not experience complications related to bowel motility Outcome: Progressing Goal: Will not experience complications related to urinary retention Outcome: Progressing   Problem: Pain  Managment: Goal: General experience of comfort will improve and/or be controlled Outcome: Progressing   Problem: Safety: Goal: Ability to remain free from injury will improve Outcome: Progressing   Problem: Skin Integrity: Goal: Risk for impaired skin integrity will decrease Outcome: Progressing

## 2023-11-03 NOTE — Progress Notes (Signed)
 Phillip Young was instructed he could not have any fluids from his bag he brings to dialysis because we are attempting to take 6 liters off. He got upset and began to use inappropriate language. He then stated he wanted to end treatment, he refused to sign the early termination form, Dr. Marisue Humble made aware of the issue. Another one of the doctors visited bedside he continued to verbally abuse staff. When he was told he could not speak in that manner he stated he would be quiet and not say anything.

## 2023-11-03 NOTE — Progress Notes (Signed)
 Pt is refusing to be NPO, stating he is not having the procedure done tomorrow. He said he has to have more fluid removed first. RN messaged MD on call to make aware.   Phillip Young Kaianna Dolezal

## 2023-11-03 NOTE — Progress Notes (Signed)
 Attemped to get a last BP for patient ending treatment, he refused and removed BP cuff

## 2023-11-03 NOTE — Procedures (Signed)
 I was present at this dialysis session. I have reviewed the session and made appropriate changes.   UF goal of 6L, tol 5.5L and chronic volume overload is principal issue here.  Hb stable. He is w/o complaint  ID notes reviewed.  Recommended TDC holiday.  Given his vol issues and that this is a Thursday will discuss timing with IR. Probably HD again tomorrow and removal thereafter with holiday over the weekend.      Filed Weights   11/01/23 0922 11/03/23 0500 11/03/23 0929  Weight: (!) 198.2 kg (!) 192.5 kg (!) 200 kg    Recent Labs  Lab 11/03/23 0501  NA 130*  K 4.1  CL 92*  CO2 20*  GLUCOSE 133*  BUN 49*  CREATININE 9.06*  CALCIUM 9.3  PHOS 6.7*    Recent Labs  Lab 11/01/23 1004 11/01/23 1020 11/02/23 0523 11/03/23 0501  WBC 8.5  --  7.6 6.9  NEUTROABS 5.5  --   --   --   HGB 10.5* 11.2* 10.0* 10.1*  HCT 33.8* 33.0* 30.2* 31.4*  MCV 106.6*  --  101.7* 102.3*  PLT 178  --  175 184    Scheduled Meds:  apixaban  5 mg Oral BID   Chlorhexidine Gluconate Cloth  6 each Topical Q0600   cinacalcet  30 mg Oral Q M,W,F-HD   digoxin  0.125 mg Oral Q M,W,F   levothyroxine  25 mcg Oral Q0600   midodrine  30 mg Oral TID WC   pregabalin  50 mg Oral Daily   sevelamer carbonate  1,600 mg Oral TID WC   venlafaxine XR  37.5 mg Oral Daily   Continuous Infusions:  anticoagulant sodium citrate     vancomycin     PRN Meds:.anticoagulant sodium citrate, camphor-menthol, lidocaine (PF), lidocaine-prilocaine, oxyCODONE, pentafluoroprop-tetrafluoroeth   Sabra Heck  MD 11/03/2023, 9:47 AM

## 2023-11-03 NOTE — Progress Notes (Signed)
  IR BRIEF NOTE:  Attempted to consent patient for upcoming tunneled HD cath removal tomorrow (after HD), as well as tunneled HD cath placement on 4/14, after line holiday. Patient consulted by phone with his mother during encounter for consent.   Patient refused to sign either consent today, prompted by his mother. Patient will only sign consent tomorrow after "enough fluid is removed so I can lay flat for procedure". Similarly, patient is refusing to sign consent for placement of HD cath on 4/14 as he is unsure how he will feel by then.   Patient was reassured he can rescind consent at any time, and no procedure will occur without his continued expressed consent. By signing consents today, IR Service will be better poised to ensure streamlined and timely care. By not signing today, delays may occur in consent signing and procedure timing, which may ultimately delay line holiday into next week. Patient and his mother both voiced understanding, but continue to refuse signing consent.   [name] requests to be informed of patient outcomes s/p IR procedures. Patient gives verbal permission to do so.     Electronically Signed: Sable Feil, PA-C 11/03/2023, 6:10 PM

## 2023-11-03 NOTE — Progress Notes (Signed)
 Patient is demanding to end treatment. Patient became verbally abusive, due to him not be able to consumed and food or liquid while on treatment. Per attending nurse. I tried to talk patient in continuing treatment and advised Phillip Young that he has a lot of fluid that needs to be removed. He still refused, stating that he wanted another nurse, due to his nurse only be an LPN. Dr. and other staff addressed Phillip Young concerns, but he proceeded to cuss the staff, I again asked Phillip Young if he would like to continue his treatment he declined, so I advised him that he could possibly come back tomorrow.

## 2023-11-03 NOTE — Progress Notes (Signed)
 Pharmacy Antibiotic Note  Phillip Young is a 38 y.o. male admitted on 11/01/2023 with E faecalis + MRSE bacteremia.Marland Kitchen  Pharmacy has been consulted for Vancomycin dosing.  The patient received a 2g LD on 4/8, followed by a 1g maintenance dose on 4/9 AM to supplement for his HD session on 4/8 (4h at a BFR of 400)  The patient signed off of HD this AM early after 1 hour. Given the patient's large size (193 kg, BMI 57) and lower initial loading dose (2g as opposed to 2.5g). Will allow for the 1g post-HD dose to still be given today. Levels are likely on the lower end/SUBtherapeutic. Will check a random level with AM labs to determine additional Vancomycin needs.   Noted plans for HD tomorrow, then likely line holiday.  Plan: - Continue with Vancomycin 1g x 1 after HD today (noted signed off AMA after 1 hour) - Obtain a random Vancomycin level with 4/11 AM labs - Will continue to follow HD schedule/duration, culture results, LOT, and antibiotic de-escalation plans   Height: 6' (182.9 cm) Weight: (!) 193.3 kg (426 lb 2.4 oz) IBW/kg (Calculated) : 77.6  Temp (24hrs), Avg:97.9 F (36.6 C), Min:97.6 F (36.4 C), Max:98.1 F (36.7 C)  Recent Labs  Lab 11/01/23 1004 11/01/23 1020 11/01/23 1021 11/01/23 1338 11/02/23 0523 11/03/23 0501  WBC 8.5  --   --   --  7.6 6.9  CREATININE 10.84* 12.50*  --   --  8.13* 9.06*  LATICACIDVEN  --   --  2.1* 1.9  --   --     Estimated Creatinine Clearance: 19.4 mL/min (A) (by C-G formula based on SCr of 9.06 mg/dL (H)).    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  Georgina Pillion, PharmD, BCPS, BCIDP Infectious Diseases Clinical Pharmacist 11/03/2023 12:39 PM   **Pharmacist phone directory can now be found on [email or web address] ([email or web address]).  Listed under [facility].

## 2023-11-03 NOTE — Progress Notes (Signed)
 Patient was asked again ,if he wanted to discontinue his hd treatment.Patient said" disconnect me from the machine and let me go back into my room.While I was disconnecting him ,he pulled out a cup of grapes and a can of soda from his bag and start eating and drinking,but I told patient wait until I totally disconnected,cap and clamp his catheter.He complied this time.

## 2023-11-03 NOTE — Progress Notes (Addendum)
 Was called to patient's bedside,patient was mad and cursed out staff including the Clinical research associate.I explained why we are not allowing extra fluid to him right now,he said and "f ''words to this writer and responded back to him that we want to an effective/helpful  caregiver to him ,but not enabler for his being non compliant.He has had excess of 500 cc of fluid as per note early a.m. Patient said '' I' ll stop the treatment,disconnect me as heard by the other nurse.

## 2023-11-03 NOTE — Progress Notes (Signed)
 Phillip Young is on 1200 ml  fluid restriction. Despite education and the importance to keep strict I&O's patient insist on more cranberry juice. He said " I am 38 years old, I can get what I want." As of midnight he's had 480 ml. (Over a third of his limit for the day.) MD on call has been made aware. Will continue to monitor.

## 2023-11-04 ENCOUNTER — Encounter (HOSPITAL_COMMUNITY): Payer: Self-pay | Admitting: Anesthesiology

## 2023-11-04 ENCOUNTER — Other Ambulatory Visit (HOSPITAL_COMMUNITY)

## 2023-11-04 ENCOUNTER — Encounter (HOSPITAL_COMMUNITY)
Admission: EM | Disposition: A | Discharge status: Home / Self Care | Payer: Self-pay | Source: Home / Self Care | Attending: Internal Medicine

## 2023-11-04 ENCOUNTER — Inpatient Hospital Stay (HOSPITAL_COMMUNITY)

## 2023-11-04 DIAGNOSIS — F4323 Adjustment disorder with mixed anxiety and depressed mood: Secondary | ICD-10-CM | POA: Diagnosis not present

## 2023-11-04 DIAGNOSIS — R7881 Bacteremia: Secondary | ICD-10-CM | POA: Diagnosis not present

## 2023-11-04 DIAGNOSIS — I482 Chronic atrial fibrillation, unspecified: Secondary | ICD-10-CM | POA: Diagnosis not present

## 2023-11-04 DIAGNOSIS — J9621 Acute and chronic respiratory failure with hypoxia: Secondary | ICD-10-CM | POA: Diagnosis not present

## 2023-11-04 LAB — RENAL FUNCTION PANEL
Albumin: 3 g/dL — ABNORMAL LOW (ref 3.5–5.0)
Anion gap: 18 — ABNORMAL HIGH (ref 5–15)
BUN: 50 mg/dL — ABNORMAL HIGH (ref 6–20)
CO2: 20 mmol/L — ABNORMAL LOW (ref 22–32)
Calcium: 9.3 mg/dL (ref 8.9–10.3)
Chloride: 93 mmol/L — ABNORMAL LOW (ref 98–111)
Creatinine, Ser: 9.59 mg/dL — ABNORMAL HIGH (ref 0.61–1.24)
GFR, Estimated: 7 mL/min — ABNORMAL LOW (ref >60)
Glucose, Bld: 94 mg/dL (ref 70–99)
Phosphorus: 6.8 mg/dL — ABNORMAL HIGH (ref 2.5–4.6)
Potassium: 4.9 mmol/L (ref 3.5–5.1)
Sodium: 131 mmol/L — ABNORMAL LOW (ref 135–145)

## 2023-11-04 LAB — CBC
HCT: 31.4 % — ABNORMAL LOW (ref 39.0–52.0)
Hemoglobin: 10.2 g/dL — ABNORMAL LOW (ref 13.0–17.0)
MCH: 33.1 pg (ref 26.0–34.0)
MCHC: 32.5 g/dL (ref 30.0–36.0)
MCV: 101.9 fL — ABNORMAL HIGH (ref 80.0–100.0)
Platelets: 204 10*3/uL (ref 150–400)
RBC: 3.08 MIL/uL — ABNORMAL LOW (ref 4.22–5.81)
RDW: 16.4 % — ABNORMAL HIGH (ref 11.5–15.5)
WBC: 6.9 10*3/uL (ref 4.0–10.5)
nRBC: 0 % (ref 0.0–0.2)

## 2023-11-04 LAB — VANCOMYCIN, RANDOM: Vancomycin Rm: 27 ug/mL

## 2023-11-04 LAB — CULTURE, BLOOD (ROUTINE X 2): Special Requests: ADEQUATE

## 2023-11-04 LAB — HEPATITIS B SURFACE ANTIBODY, QUANTITATIVE: Hep B S AB Quant (Post): 84 m[IU]/mL

## 2023-11-04 SURGERY — TRANSESOPHAGEAL ECHOCARDIOGRAM (TEE) (CATHLAB)
Anesthesia: Monitor Anesthesia Care

## 2023-11-04 MED ORDER — HEPARIN SODIUM (PORCINE) 1000 UNIT/ML IJ SOLN
INTRAMUSCULAR | Status: AC
  Filled 2023-11-04: qty 4

## 2023-11-04 MED ORDER — VANCOMYCIN HCL 750 MG/150ML IV SOLN
750.0000 mg | Freq: Once | INTRAVENOUS | Status: AC
  Administered 2023-11-04: 750 mg via INTRAVENOUS
  Filled 2023-11-04: qty 150

## 2023-11-04 MED ORDER — VANCOMYCIN HCL IN DEXTROSE 1-5 GM/200ML-% IV SOLN: 1000.0000 mg | INTRAVENOUS | Status: DC

## 2023-11-04 NOTE — Plan of Care (Signed)
  Problem: Education: Goal: Individualized Educational Video(s) Outcome: Not Applicable   

## 2023-11-04 NOTE — Plan of Care (Signed)
  Problem: Education: Goal: Knowledge of disease and its progression will improve Outcome: Progressing Goal: Individualized Educational Video(s) Outcome: Progressing   Problem: Fluid Volume: Goal: Compliance with measures to maintain balanced fluid volume will improve Outcome: Progressing   Problem: Health Behavior/Discharge Planning: Goal: Ability to manage health-related needs will improve Outcome: Progressing   Problem: Nutritional: Goal: Ability to make healthy dietary choices will improve Outcome: Progressing   Problem: Clinical Measurements: Goal: Complications related to the disease process, condition or treatment will be avoided or minimized Outcome: Progressing   Problem: Education: Goal: Knowledge of General Education information will improve Description: Including pain rating scale, medication(s)/side effects and non-pharmacologic comfort measures Outcome: Progressing   Problem: Health Behavior/Discharge Planning: Goal: Ability to manage health-related needs will improve Outcome: Progressing   Problem: Clinical Measurements: Goal: Ability to maintain clinical measurements within normal limits will improve Outcome: Progressing Goal: Will remain free from infection Outcome: Progressing Goal: Diagnostic test results will improve Outcome: Progressing Goal: Respiratory complications will improve Outcome: Progressing Goal: Cardiovascular complication will be avoided Outcome: Progressing   Problem: Activity: Goal: Risk for activity intolerance will decrease Outcome: Progressing   Problem: Nutrition: Goal: Adequate nutrition will be maintained Outcome: Progressing   Problem: Coping: Goal: Level of anxiety will decrease Outcome: Progressing   Problem: Elimination: Goal: Will not experience complications related to bowel motility Outcome: Progressing Goal: Will not experience complications related to urinary retention Outcome: Progressing   Problem: Pain  Managment: Goal: General experience of comfort will improve and/or be controlled Outcome: Progressing   Problem: Safety: Goal: Ability to remain free from injury will improve Outcome: Progressing   Problem: Skin Integrity: Goal: Risk for impaired skin integrity will decrease Outcome: Progressing

## 2023-11-04 NOTE — Progress Notes (Signed)
 Presented to bedside following message from nursing staff that patient was refusing n.p.o., stating he would not be having procedure tomorrow.  I do see a note from IR provider as well regarding similar concerns.  Patient expressing frustrations regarding hospital stay.  He states that he is very volume overloaded, does not think that he can lay back flat for procedure at this time.  He states in the past he has had difficulty with procedures for similar reasons, and has had to have procedures aborted due to hypotension which he attributed to volume overload and distention.  Patient was able to explain to me the indications for the procedures, blood cultures demonstrated bacteremia.  Per consulting services, Line holiday would be in order as well as transesophageal imaging of the heart valves in order to evaluate for possible endocarditis.  Discussed that failure/delays in exchanging dialysis catheter and evaluating for endocarditis may lead to increased risk for poor outcome, increased morbidity and mortality.  Patient demonstrated understanding of the indications, risks, benefits of of the given situation.  Patient states that he finds it very doubtful that he would be able to tolerate these procedures at this time, would not like to be n.p.o. if he is not going to be able to get the procedure.  I asked the patient to please remain n.p.o., this will allow greater flexibility in making decision in the morning.  Patient states he will try to remain n.p.o. so we may continue discussions in the morning with day team and consulting services.  Also discussed that remaining n.p.o. would be beneficial to his volume status.  Unfortunately, dialysis session cut short today as documented in notes from dialysis providers. I attempted to reach out to HD to see if it would be possible to dialyze patient, unable to reach them at this time. Doubtful that they would be able to fit patient in at this hour without indications  for emergent dialysis.

## 2023-11-04 NOTE — Plan of Care (Signed)
 Id brief note   Mrse and e faecalis bacteremia    Patient refused line removal wanting more hd done Tee not done as he wasn't npo    -continue vanc -would help with tee finding to decide proper abx treatment -appreciate cards and renal help -line removal/holiday per renal; ideally 2 days free of bacteremia prior to replacement -continue vanc for now -dr Elinor Parkinson available for urgent question this weekend -dr Daiva Eves to take over care monday

## 2023-11-04 NOTE — Progress Notes (Signed)
                  Subjective:   Summary: Phillip Young is a 38 yo male with chronic hypoxic respiratory failure on 2-4L home O2, HFrEF w/LVEF <20%, Afib on Eliquis, chronic pain, ESRD on HD MWF who presented to the ED with shortness of breath. He is being treated for volume overload due to missed HD session as well as E faecalis and MRSE bacteremia  Patient was seen in HD today. He is frustrated about being asked to be NPO other aspects of his care thus far. He is currently unwilling to have his TDC removed, saying he is still too volume overloaded and uncomfortable and wants more HD before removing it.   Objective:  Vital signs in last 24 hours: Vitals:   11/03/23 2027 11/03/23 2135 11/03/23 2200 11/04/23 0705  BP: (!) 86/53 99/80  (!) 124/103  Pulse: 95 72  (!) 105  Resp: 20  20 19   Temp: 98.3 F (36.8 C)   98 F (36.7 C)  TempSrc: Oral   Oral  SpO2: 100% 100%  96%  Weight:      Height:       Supplemental O2: Nasal Cannula SpO2: 96 % O2 Flow Rate (L/min): 6 L/min  Physical Exam:  Constitutional: chronically ill appearing, no acute distress Neuro: A&O x 3  Assessment/Plan:   Enterococcus faecalis and MRSE bacteremia Blood cultures positive for Enterococcus faecalis and staph epidermitis. Patient remains relatively asymptomatic with no fever, leukocytosis or other systemic symptoms. Per discussion with ID and cardiology, patient will be very high risk for TEE and should undergo TTE first. Unfortunately, patient ate overnight despite nursing staff and MD intervention - Vancomycin per pharmacy  - TTE ordered to evaluate for endocarditis, may require TEE pending TTE findings - ID following, appreciate recommendations - Plan to remove [facility] after HD today if patient will consent for procedure - Catheter tip to be sent for semiquantitative culture after removal - Repeat blood cultures after line removal - Per IR, plan for catheter replacement on 4/14  Acute on chronic hypoxic  respiratory failure  Hx presumed amiodarone toxicity HFrEF, LVEF <20% ESRD on HD Patient continues to require 5L [place] to maintain appropriate O2 saturations, though his dyspnea and lower extremity edema are subjectively improved after HD x 2. He elected to terminate HD yesterday early due to discomfort.  - HD today per nephrology  Chronic somatic and neuropathic pain Continue venlafaxine 37.5mg  daily, lyrica 50mg  daily, oxycodone 10mg  q6h prn, Sarna lotion  Anemia of chronic disease: Hgb stable, no evidence of acute blood loss Paroxysmal atrial fibrillation: Continue home Eliquis 5mg  BID, Digoxin 0.125mg  MWF Hypothyroidism: Continue home levothyroxine Depression: Continue home venlafaxine 37.5mg   Diet: Renal diet VTE: Eliquis Code: Full  Dispo: Anticipated discharge to Home pending medical stability.   Monna Fam, MD PGY-1 Internal Medicine Resident Pager Number [phone] Please contact the on call pager after 5 pm and on weekends at [phone].

## 2023-11-04 NOTE — Procedures (Signed)
 I was present at this dialysis session. I have reviewed the session and made appropriate changes.   Patient had a very brief dialysis treatment yesterday.  He was frustrated with my order not to provide food or beverages during dialysis as we are trying to achieve 6 L of UF.  Additionally he was just courteous to staff as noted.  Seen during dialysis here, goal 6 L using TDC.  Tentative plan is for [facility] removal following dialysis, but patient is refusing stating he needs more fluid off first.  We provided him an opportunity for 6 L removal yesterday, of note, but he signed off.  At length discussed that staff should be treated respectfully.  I also reinforced my order that he is not to eat or drink during dialysis.  He has some concerns about the staff and I will address that with management.  Filed Weights   11/03/23 0500 11/03/23 0929 11/04/23 0850  Weight: (!) 192.5 kg (!) 193.3 kg (!) 194.5 kg    Recent Labs  Lab 11/04/23 0614  NA 131*  K 4.9  CL 93*  CO2 20*  GLUCOSE 94  BUN 50*  CREATININE 9.59*  CALCIUM 9.3  PHOS 6.8*    Recent Labs  Lab 11/01/23 1004 11/01/23 1020 11/02/23 0523 11/03/23 0501 11/04/23 0614  WBC 8.5  --  7.6 6.9 6.9  NEUTROABS 5.5  --   --   --   --   HGB 10.5*   < > 10.0* 10.1* 10.2*  HCT 33.8*   < > 30.2* 31.4* 31.4*  MCV 106.6*  --  101.7* 102.3* 101.9*  PLT 178  --  175 184 204   < > = values in this interval not displayed.    Scheduled Meds:  apixaban  5 mg Oral BID   Chlorhexidine Gluconate Cloth  6 each Topical Q0600   cinacalcet  30 mg Oral Q M,W,F-HD   digoxin  0.125 mg Oral Q M,W,F   levothyroxine  25 mcg Oral Q0600   midodrine  30 mg Oral TID WC   pregabalin  50 mg Oral Daily   sevelamer carbonate  1,600 mg Oral TID WC   venlafaxine XR  37.5 mg Oral Daily   Continuous Infusions:  anticoagulant sodium citrate     vancomycin 200 mL/hr at 11/03/23 1500   PRN Meds:.camphor-menthol, oxyCODONE   Sabra Heck  MD 11/04/2023, 9:13  AM

## 2023-11-04 NOTE — Progress Notes (Signed)
 Pharmacy Antibiotic Note  Phillip Young is a 38 y.o. male admitted on 11/01/2023 with E faecalis + MRSE bacteremia.Marland Kitchen  Pharmacy has been consulted for Vancomycin dosing.  The patient received a 2g LD on 4/8, followed by a 1g maintenance dose on 4/9 AM to supplement for his HD session on 4/8 (4h at a BFR of 400)  The patient signed off of HD this AM early after 1 hour. Given the patient's large size (193 kg, BMI 57) and lower initial loading dose (2g as opposed to 2.5g). Will allow for the 1g post-HD dose to still be given today. Levels are likely on the lower end/SUBtherapeutic. Will check a random level with AM labs to determine additional Vancomycin needs.   PreHD vanc level came back at 27 today. He is currently getting HD. Plan for [facility] removal today. Plan for line holiday.   Plan: - Vanc 750mg  IV x1 after HD today - Vanc 1g IV qMWF based on HD schedule - Will continue to follow HD schedule/duration, culture results, LOT, and antibiotic de-escalation plans   Height: 6' (182.9 cm) Weight: (!) 194.5 kg (428 lb 12.7 oz) IBW/kg (Calculated) : 77.6  Temp (24hrs), Avg:98 F (36.7 C), Min:97.2 F (36.2 C), Max:98.4 F (36.9 C)  Recent Labs  Lab 11/01/23 1004 11/01/23 1020 11/01/23 1021 11/01/23 1338 11/02/23 0523 11/03/23 0501 11/04/23 0614  WBC 8.5  --   --   --  7.6 6.9 6.9  CREATININE 10.84* 12.50*  --   --  8.13* 9.06* 9.59*  LATICACIDVEN  --   --  2.1* 1.9  --   --   --   VANCORANDOM  --   --   --   --   --   --  27    Estimated Creatinine Clearance: 18.4 mL/min (A) (by C-G formula based on SCr of 9.59 mg/dL (H)).    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, SRA positive   Metoprolol Other (See Comments)    near syncope   Amoxicillin Other (See Comments)    Was  hospitalized    Other Swelling and Other (See Comments)    Steroids Fluid seeping out of legs    Tramadol     Antimicrobials this admission: Cefepime 4/8 x 1 Flagyl 4/8 x 1 Vancomycin 4/8 >> - Doses: 2g on 4/8 @ 1448, 1g (4/9 @ 0555, 4/10 @ 1200) - HD sessions: 4/8 (BFR 400, 4h), 4/10 (BFR 400, ~1h)  Dose adjustments this admission: N/a  Microbiology results: 4/8 BCx >> 1/4 E faecalis + MRSE 4/9 BCx >> ngx1d  Ulyses Southward, PharmD, [place], AAHIVP, CPP Infectious Disease Pharmacist 11/04/2023 12:11 PM

## 2023-11-04 NOTE — Progress Notes (Signed)
  Echocardiogram 2D Echocardiogram has been attempted before scheduled tee, pt in dialysis, TEE cancelled  Leda Roys RDCS 11/04/2023, 2:37 PM

## 2023-11-04 NOTE — Progress Notes (Signed)
 Chaplain responded to a request in the [facility] list, to offer spiritual support to Pt. Pt welcomed Chaplain and shared he was under a lot of pain and that he needed prayer, since he didn't feel too well to have a long conversation. Chaplain then prayed along with Pt. Pt was very grateful for this visit.  Oneida Alar Chaplain Resident   11/04/23 1544  Spiritual Encounters  Type of Visit Initial  Care provided to: Patient  Referral source Patient request;Clinical staff  Reason for visit Routine spiritual support  OnCall Visit No

## 2023-11-04 NOTE — Anesthesia Preprocedure Evaluation (Signed)
 Anesthesia Evaluation  Patient identified by MRN, date of birth, ID band Patient awake    Reviewed: Allergy & Precautions, NPO status , Patient's Chart, lab work & pertinent test results  Airway       Comment: Previous grade II view with Glidescope 4, easy mask Dental   Pulmonary sleep apnea , former smoker Chronic hypoxic respiratory failure on 2-4L home O2, Obesity hypoventilation syndrome, chronic cough, multiple lung nodules          Cardiovascular hypertension, +CHF (EF < 20%)  + dysrhythmias (RBBB) Atrial Fibrillation + Valvular Problems/Murmurs MR   TTE 09/08/2023: IMPRESSIONS    1. Based on VTI and aortic valve excursion cardiac output is severely  reduced.. Left ventricular ejection fraction, by estimation, is <20%. The  left ventricle has severely decreased function. The left ventricle has  global hypokinesis. The left  ventricular internal cavity size was moderately dilated. Left ventricular  diastolic function could not be evaluated due to atrial fibrillation but  is likely abnormal.   2. Right ventricular systolic function is moderately reduced. The right  ventricular size is mildly enlarged. There is mildly elevated pulmonary  artery systolic pressure.   3. Left atrial size was moderately dilated.   4. The mitral valve is normal in structure. Mild mitral valve  regurgitation. No evidence of mitral stenosis.   5. Tricuspid valve regurgitation is moderate.   6. The aortic valve is normal in structure. Aortic valve regurgitation is  not visualized. No aortic stenosis is present.   7. Severely dilated pulmonary artery.     Neuro/Psych Seizures: neuropathy.  PSYCHIATRIC DISORDERS  Depression     Neuromuscular disease    GI/Hepatic ,GERD  ,,  Endo/Other  diabetes, Type 2Hypothyroidism  Class 4 obesity  Renal/GU ESRF and DialysisRenal disease     Musculoskeletal   Abdominal   Peds  Hematology  (+) Blood  dyscrasia, anemia Lab Results      Component                Value               Date                      WBC                      6.9                 11/04/2023                HGB                      10.2 (L)            11/04/2023                HCT                      31.4 (L)            11/04/2023                MCV                      101.9 (H)           11/04/2023                PLT  204                 11/04/2023              Anesthesia Other Findings 38 yo male with chronic hypoxic respiratory failure on 2-4L home O2, HFrEF w/LVEF <20%, Afib on Eliquis, chronic pain, ESRD on HD MWF who presented to the ED with shortness of breath. He is being treated for volume overload due to missed HD session as well as E faecalis and MRSA bacteremia  Last Eliquis:  Last Zepbound:  Reproductive/Obstetrics                              Anesthesia Physical Anesthesia Plan  ASA: 4  Anesthesia Plan:    Post-op Pain Management:    Induction:   PONV Risk Score and Plan:   Airway Management Planned:   Additional Equipment:   Intra-op Plan:   Post-operative Plan:   Informed Consent:   Plan Discussed with:   Anesthesia Plan Comments:          Anesthesia Quick Evaluation

## 2023-11-05 ENCOUNTER — Inpatient Hospital Stay (HOSPITAL_COMMUNITY)

## 2023-11-05 DIAGNOSIS — R7881 Bacteremia: Secondary | ICD-10-CM

## 2023-11-05 LAB — RENAL FUNCTION PANEL
Albumin: 2.9 g/dL — ABNORMAL LOW (ref 3.5–5.0)
Anion gap: 16 — ABNORMAL HIGH (ref 5–15)
BUN: 36 mg/dL — ABNORMAL HIGH (ref 6–20)
CO2: 22 mmol/L (ref 22–32)
Calcium: 8.9 mg/dL (ref 8.9–10.3)
Chloride: 92 mmol/L — ABNORMAL LOW (ref 98–111)
Creatinine, Ser: 8.02 mg/dL — ABNORMAL HIGH (ref 0.61–1.24)
GFR, Estimated: 8 mL/min — ABNORMAL LOW (ref >60)
Glucose, Bld: 107 mg/dL — ABNORMAL HIGH (ref 70–99)
Phosphorus: 5.7 mg/dL — ABNORMAL HIGH (ref 2.5–4.6)
Potassium: 4.1 mmol/L (ref 3.5–5.1)
Sodium: 130 mmol/L — ABNORMAL LOW (ref 135–145)

## 2023-11-05 LAB — CBC
HCT: 32.4 % — ABNORMAL LOW (ref 39.0–52.0)
Hemoglobin: 10.5 g/dL — ABNORMAL LOW (ref 13.0–17.0)
MCH: 33.1 pg (ref 26.0–34.0)
MCHC: 32.4 g/dL (ref 30.0–36.0)
MCV: 102.2 fL — ABNORMAL HIGH (ref 80.0–100.0)
Platelets: 210 10*3/uL (ref 150–400)
RBC: 3.17 MIL/uL — ABNORMAL LOW (ref 4.22–5.81)
RDW: 16.5 % — ABNORMAL HIGH (ref 11.5–15.5)
WBC: 7.4 10*3/uL (ref 4.0–10.5)
nRBC: 0 % (ref 0.0–0.2)

## 2023-11-05 LAB — ECHOCARDIOGRAM COMPLETE
Est EF: <20
Height: 72 in
S' Lateral: 6.2 cm
Weight: 6652.6 [oz_av]

## 2023-11-05 MED ORDER — LIDOCAINE HCL (PF) 1 % IJ SOLN: 5.0000 mL | INTRAMUSCULAR | Status: DC | PRN

## 2023-11-05 MED ORDER — PENTAFLUOROPROP-TETRAFLUOROETH EX AERO: 1.0000 | INHALATION_SPRAY | CUTANEOUS | Status: DC | PRN

## 2023-11-05 MED ORDER — ORAL CARE MOUTH RINSE: 15.0000 mL | OROMUCOSAL | Status: DC | PRN

## 2023-11-05 MED ORDER — ANTICOAGULANT SODIUM CITRATE 4% (200MG/5ML) IV SOLN: 5.0000 mL | Status: DC | PRN

## 2023-11-05 MED ORDER — VANCOMYCIN HCL IN DEXTROSE 1-5 GM/200ML-% IV SOLN
1000.0000 mg | INTRAVENOUS | Status: AC
  Administered 2023-11-05: 1000 mg via INTRAVENOUS
  Filled 2023-11-05: qty 200

## 2023-11-05 MED ORDER — LIDOCAINE-PRILOCAINE 2.5-2.5 % EX CREA: 1.0000 | TOPICAL_CREAM | CUTANEOUS | Status: DC | PRN

## 2023-11-05 MED ORDER — CHLORHEXIDINE GLUCONATE CLOTH 2 % EX PADS: 6.0000 | MEDICATED_PAD | Freq: Every day | CUTANEOUS | Status: DC

## 2023-11-05 MED ORDER — ALTEPLASE 2 MG IJ SOLR: 2.0000 mg | Freq: Once | INTRAMUSCULAR | Status: DC | PRN

## 2023-11-05 NOTE — Progress Notes (Signed)
 [facility] Progress Note   Subjective:  Pt seen in room, he did refuse TDC removal yesterday. He feels like he still has more fluid to remove before procedure. Reports shortness of breath, currently NAD. Denies CP, palpitations, nausea. Spoke with IR PA- will not be able to remove [facility] post HD today but could potentially remove it tomorrow.   Objective Vitals:   11/04/23 1316 11/04/23 1716 11/04/23 1940 11/05/23 0602  BP: 106/61 (!) 94/56 (!) 153/51 108/89  Pulse: (!) 107 97 88 70  Resp: 19 19 19 19   Temp: 97.8 F (36.6 C) 98.5 F (36.9 C) 97.9 F (36.6 C) 98.2 F (36.8 C)  TempSrc:   Oral   SpO2: 98% 97% 100% 100%  Weight: (!) 188.6 kg     Height:       Physical Exam General: Alert male, laying flat in bed, NAD Heart: RRR, no murmurs, rubs or gallops Lungs: CTA anteriorly, respirations unlabored Abdomen: Soft, +BS Extremities: 3+ pitting edema b/l lower extremities Dialysis Access: R internal jugular [facility]  Additional Objective Labs: Basic Metabolic Panel: Recent Labs  Lab 11/03/23 0501 11/04/23 0614 11/05/23 0713  NA 130* 131* 130*  K 4.1 4.9 4.1  CL 92* 93* 92*  CO2 20* 20* 22  GLUCOSE 133* 94 107*  BUN 49* 50* 36*  CREATININE 9.06* 9.59* 8.02*  CALCIUM 9.3 9.3 8.9  PHOS 6.7* 6.8* 5.7*   Liver Function Tests: Recent Labs  Lab 11/01/23 1004 11/02/23 0523 11/03/23 0501 11/04/23 0614 11/05/23 0713  AST 19  --   --   --   --   ALT 16  --   --   --   --   ALKPHOS 208*  --   --   --   --   BILITOT 1.0  --   --   --   --   PROT 7.2  --   --   --   --   ALBUMIN 3.1*   < > 3.0* 3.0* 2.9*   < > = values in this interval not displayed.   No results for input(s): "LIPASE", "AMYLASE" in the last 168 hours. CBC: Recent Labs  Lab 11/01/23 1004 11/01/23 1020 11/02/23 0523 11/03/23 0501 11/04/23 0614 11/05/23 0713  WBC 8.5  --  7.6 6.9 6.9 7.4  NEUTROABS 5.5  --   --   --   --   --   HGB 10.5*   < > 10.0* 10.1* 10.2* 10.5*  HCT 33.8*   < >  30.2* 31.4* 31.4* 32.4*  MCV 106.6*  --  101.7* 102.3* 101.9* 102.2*  PLT 178  --  175 184 204 210   < > = values in this interval not displayed.   Blood Culture    Component Value Date/Time   SDES BLOOD LEFT ARM 11/02/2023 0520   SPECREQUEST  11/02/2023 0520    BOTTLES DRAWN AEROBIC AND ANAEROBIC Blood Culture adequate volume   CULT  11/02/2023 0520    NO GROWTH 3 DAYS Performed at [facility], [address] [place], [place] [address]    REPTSTATUS PENDING 11/02/2023 5784    Medications:  [START ON 11/07/2023] vancomycin      apixaban  5 mg Oral BID   Chlorhexidine Gluconate Cloth  6 each Topical Q0600   Chlorhexidine Gluconate Cloth  6 each Topical Q0600   cinacalcet  30 mg Oral Q M,W,F-HD   digoxin  0.125 mg Oral Q M,W,F   levothyroxine  25 mcg Oral Q0600   midodrine  30 mg Oral TID WC   pregabalin  50 mg Oral Daily   sevelamer carbonate  1,600 mg Oral TID WC   venlafaxine XR  37.5 mg Oral Daily    Outpatient Dialysis Orders: MWF - [facility] 4:30hr, 400/800, EDW 155.5kg, 2K/2Ca, TDC, no heparin (HIT) - Mircera 200mcg IV q 2 weeks - last 4/4 - Sensipar 30mg  PO HD  Assessment/Plan:  Acute on Chronic Resp Failure: Chronically overloaded. Extra HD again today  Sacral Cellulitis: ID recommending line holiday. Pt refused yesterday. Feels he needs more fluid removed before procedure. Spoke with IR PA and they will not be able to remove [facility] post HD today, could potentially remove it tomorrow.  On abx per ID  ESRD: Normally MWF schedule, extra HD today for volume as above  Hypotension/volume: BP low, stable on mido. Severe edema  Anemia: Hgb 10.5 - just given ESA as outpatient.  Metabolic bone disease: Ca/Phos pending. Continue home binders/sensipar.  Ramona Burner, PA-C 11/05/2023, 9:42 AM  [facility] Pager: [phone]

## 2023-11-05 NOTE — Progress Notes (Signed)
 Echocardiogram 2D Echocardiogram has been performed.  Emmaline Haring Karynn Deblasi RDCS 11/05/2023, 8:40 AM

## 2023-11-05 NOTE — Progress Notes (Signed)
 Patient refused to order breakfast due to nurse not allowing him to order a guest tray instead of his Renal diet ordered. Patient door dashed [employer] instead.

## 2023-11-05 NOTE — Progress Notes (Signed)
                  Subjective:   Summary: Phillip Young is a 38 yo male with chronic hypoxic respiratory failure on 2-4L home O2, HFrEF w/LVEF <20%, Afib on Eliquis, chronic pain, ESRD on HD MWF who presented to the ED with shortness of breath. He is being treated for volume overload due to missed HD session as well as E faecalis and MRSE bacteremia  Overall doing better this morning after 6 L ultrafiltration yesterday.  He still has his chronic pain but does not have any new or worsening pain.  We discussed the plan of doing a line holiday and he is still on board although like yesterday he wants to make sure that he gets enough dialysis prior to removing this line.  Objective:  Vital signs in last 24 hours: Vitals:   11/04/23 1316 11/04/23 1716 11/04/23 1940 11/05/23 0602  BP: 106/61 (!) 94/56 (!) 153/51 108/89  Pulse: (!) 107 97 88 70  Resp: 19 19 19 19   Temp: 97.8 F (36.6 C) 98.5 F (36.9 C) 97.9 F (36.6 C) 98.2 F (36.8 C)  TempSrc:   Oral   SpO2: 98% 97% 100% 100%  Weight: (!) 188.6 kg     Height:       Supplemental O2: Nasal Cannula SpO2: 100 % O2 Flow Rate (L/min): 6 L/min  Physical Exam:  Constitutional: chronically ill appearing, no acute distress MSK: 2+ pitting edema in lower extremities and sacrum, improved from yesterday Neuro: A&O x 3  Assessment/Plan:   Enterococcus faecalis and MRSE bacteremia Blood cultures positive for Enterococcus faecalis and staph epidermitis. Patient remains relatively asymptomatic with no fever, leukocytosis or other systemic symptoms. Per discussion with ID and cardiology, patient will be very high risk for TEE and should undergo TTE first, completed this morning pending read.  Fortunately yesterday we were unable to remove the HD line due to the patient worried about not getting enough HD before removing it.  I will talk to our nephrology and IR team about possible dialysis this weekend and removal early next week. - Vancomycin per  pharmacy  - TTE ordered to evaluate for endocarditis, may require TEE pending TTE findings - ID following, appreciate recommendations - Plan to remove TDC early next week - Catheter tip to be sent for semiquantitative culture after removal - Repeat blood cultures after line removal  Acute on chronic hypoxic respiratory failure  Hx presumed amiodarone toxicity HFrEF, LVEF <20% ESRD on HD Patient continues to require 5L [place] to maintain appropriate O2 saturations, though his dyspnea and lower extremity edema are subjectively improved after HD x 2.  - HD today per nephrology, hopefully will get an extra session this weekend  Chronic somatic and neuropathic pain Continue venlafaxine 37.5mg  daily, lyrica 50mg  daily, oxycodone 10mg  q6h prn, Sarna lotion  Anemia of chronic disease: Hgb stable, no evidence of acute blood loss Paroxysmal atrial fibrillation: Continue home Eliquis 5mg  BID, Digoxin 0.125mg  MWF Hypothyroidism: Continue home levothyroxine 25mcg Depression: Continue home venlafaxine 37.5mg   Diet: Renal diet VTE: Eliquis Code: Full  Dispo: Anticipated discharge to Home pending medical stability.   Cleven [place], DO Internal Medicine Resident, PGY-2 Please contact the on call pager at [phone] for any urgent or emergent needs. 6:24 AM 11/05/2023

## 2023-11-05 NOTE — Progress Notes (Addendum)
 Pharmacy Antibiotic Note  Phillip Young is a 38 y.o. male admitted on 11/01/2023 with E faecalis + MRSE bacteremia.Aaron Aas  Pharmacy has been consulted for Vancomycin dosing.  The patient received a 2g LD on 4/8, followed by a 1g maintenance dose on 4/9 AM to supplement for his HD session on 4/8 (4h at a BFR of 400)  The patient signed off of HD this AM early after 1 hour. Given the patient's large size (193 kg, BMI 57) and lower initial loading dose (2g as opposed to 2.5g). Will allow for the 1g post-HD dose to still be given today. Levels are likely on the lower end/SUBtherapeutic. Will check a random level with AM labs to determine additional Vancomycin needs.   PreHD vanc level came back at 27 today. He is currently getting HD. Plan for [facility] removal today. Plan for line holiday.   4/12: Pt to hemodialysis today. Will dose Vanc post-HD. Plans remain MWF HD, therefore will maintain current MWF with HD dosing. Catheter removal still pending.  Plan: - Vancomycin 1 g IV today post-HD, then continue MWF with HD dosing. - Will continue to follow HD schedule/duration, culture results, LOT, and antibiotic de-escalation plans   Height: 6' (182.9 cm) Weight: (!) 189.9 kg (418 lb 10.5 oz) IBW/kg (Calculated) : 77.6  Temp (24hrs), Avg:98.2 F (36.8 C), Min:97.9 F (36.6 C), Max:98.5 F (36.9 C)  Recent Labs  Lab 11/01/23 1004 11/01/23 1020 11/01/23 1021 11/01/23 1338 11/02/23 0523 11/03/23 0501 11/04/23 0614 11/05/23 0713  WBC 8.5  --   --   --  7.6 6.9 6.9 7.4  CREATININE 10.84* 12.50*  --   --  8.13* 9.06* 9.59* 8.02*  LATICACIDVEN  --   --  2.1* 1.9  --   --   --   --   VANCORANDOM  --   --   --   --   --   --  27  --     Estimated Creatinine Clearance: 21.6 mL/min (A) (by C-G formula based on SCr of 8.02 mg/dL (H)).    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, SRA positive   Metoprolol Other (See Comments)    near syncope   Amoxicillin Other (See Comments)    Was hospitalized    Other Swelling and Other (See Comments)    Steroids Fluid seeping out of legs    Tramadol     Antimicrobials this admission: Cefepime 4/8 x 1 Flagyl 4/8 x 1 Vancomycin 4/8 >> - Doses: 2g on 4/8 @ 1448, 1g (4/9 @ 0555, 4/10 @ 1200) - HD sessions: 4/8 (BFR 400, 4h), 4/10 (BFR 400, ~1h)  Dose adjustments this admission: N/a  Microbiology results: 4/8 BCx >> 1/4 E faecalis + MRSE 4/9 BCx >> ngx1d  Estela Held, PharmD PGY-2 Infectious Diseases Pharmacy Resident [facility] 11/05/2023 2:55 PM

## 2023-11-06 LAB — CBC
HCT: 33.8 % — ABNORMAL LOW (ref 39.0–52.0)
Hemoglobin: 10.9 g/dL — ABNORMAL LOW (ref 13.0–17.0)
MCH: 32.6 pg (ref 26.0–34.0)
MCHC: 32.2 g/dL (ref 30.0–36.0)
MCV: 101.2 fL — ABNORMAL HIGH (ref 80.0–100.0)
Platelets: 235 10*3/uL (ref 150–400)
RBC: 3.34 MIL/uL — ABNORMAL LOW (ref 4.22–5.81)
RDW: 16.5 % — ABNORMAL HIGH (ref 11.5–15.5)
WBC: 7.6 10*3/uL (ref 4.0–10.5)
nRBC: 0 % (ref 0.0–0.2)

## 2023-11-06 LAB — CULTURE, BLOOD (ROUTINE X 2)
Culture: NO GROWTH
Special Requests: ADEQUATE

## 2023-11-06 LAB — RENAL FUNCTION PANEL
Albumin: 3 g/dL — ABNORMAL LOW (ref 3.5–5.0)
Anion gap: 15 (ref 5–15)
BUN: 28 mg/dL — ABNORMAL HIGH (ref 6–20)
CO2: 22 mmol/L (ref 22–32)
Calcium: 9.4 mg/dL (ref 8.9–10.3)
Chloride: 95 mmol/L — ABNORMAL LOW (ref 98–111)
Creatinine, Ser: 6.87 mg/dL — ABNORMAL HIGH (ref 0.61–1.24)
GFR, Estimated: 10 mL/min — ABNORMAL LOW (ref >60)
Glucose, Bld: 91 mg/dL (ref 70–99)
Phosphorus: 5.1 mg/dL — ABNORMAL HIGH (ref 2.5–4.6)
Potassium: 4.4 mmol/L (ref 3.5–5.1)
Sodium: 132 mmol/L — ABNORMAL LOW (ref 135–145)

## 2023-11-06 MED ORDER — LIDOCAINE-PRILOCAINE 2.5-2.5 % EX CREA
1.0000 | TOPICAL_CREAM | CUTANEOUS | Status: DC | PRN
  Filled 2023-11-06: qty 5

## 2023-11-06 MED ORDER — CHLORHEXIDINE GLUCONATE CLOTH 2 % EX PADS
6.0000 | MEDICATED_PAD | Freq: Every day | CUTANEOUS | Status: DC
  Administered 2023-11-11: 6 via TOPICAL

## 2023-11-06 MED ORDER — PENTAFLUOROPROP-TETRAFLUOROETH EX AERO: 1.0000 | INHALATION_SPRAY | CUTANEOUS | Status: DC | PRN

## 2023-11-06 MED ORDER — ANTICOAGULANT SODIUM CITRATE 4% (200MG/5ML) IV SOLN
5.0000 mL | Status: DC | PRN
  Administered 2023-11-07: 5 mL
  Filled 2023-11-06 (×2): qty 5

## 2023-11-06 MED ORDER — LIDOCAINE HCL (PF) 1 % IJ SOLN
5.0000 mL | INTRAMUSCULAR | Status: DC | PRN
  Filled 2023-11-06: qty 5

## 2023-11-06 MED ORDER — ALTEPLASE 2 MG IJ SOLR
2.0000 mg | Freq: Once | INTRAMUSCULAR | Status: DC | PRN
  Filled 2023-11-06: qty 2

## 2023-11-06 NOTE — Progress Notes (Signed)
 Holding  his  eliquis. Pt slated for HD. Called HD RN  to verify status. Pt is scheduled. Pt took his midodrine.

## 2023-11-06 NOTE — Progress Notes (Signed)
                  Subjective:   Summary: Phillip Young is a 38 yo male with chronic hypoxic respiratory failure on 2-4L home O2, HFrEF w/LVEF <20%, Afib on Eliquis, chronic pain, ESRD on HD MWF who presented to the ED with shortness of breath. He is being treated for volume overload due to missed HD session as well as E faecalis and MRSE bacteremia  No new or worsening concerns today but he does feel like he needs more fluid off with dialysis. He is on board with plan for HD tomorrow and then line removal and will be encouraged to honor the NPO order prior to that procedure.  Objective:  Vital signs in last 24 hours: Vitals:   11/05/23 1550 11/05/23 1600 11/05/23 2106 11/06/23 0500  BP:  90/71 116/65   Pulse:  93 81   Resp:  (!) 22 18   Temp:  97.6 F (36.4 C) 98.3 F (36.8 C)   TempSrc:  Oral Oral   SpO2:  100% 99%   Weight: (!) 184.8 kg   (!) (P) 187.7 kg  Height:       Supplemental O2: Nasal Cannula SpO2: 99 % O2 Flow Rate (L/min): 4 L/min  Physical Exam:  Constitutional: chronically ill appearing, no acute distress MSK: 2+ pitting edema in lower extremities and sacrum Neuro: A&O x 3  Assessment/Plan:   Enterococcus faecalis and MRSE bacteremia Blood cultures positive for Enterococcus faecalis and staph epidermitis. Patient remains relatively asymptomatic with no fever, leukocytosis or other systemic symptoms. Repeat blood cultures from 4/9 without growth. Per discussion with ID and cardiology, patient will be very high risk for TEE and should undergo TTE 4/12 without evidence of vegetation. Hopeful to do one more HD session and then 2 day line holiday tomorrow. - Vancomycin per pharmacy  - ID following, appreciate recommendations - Plan to remove [facility] tomorrow after HD - Catheter tip to be sent for semiquantitative culture after removal - Repeat blood cultures after line removal  Acute on chronic hypoxic respiratory failure  Hx presumed amiodarone toxicity HFrEF,  LVEF <20% ESRD on HD Patient continues to require 4-6L [place] to maintain appropriate O2 saturations. At/near baseline - HD per nephrology  Chronic somatic and neuropathic pain Continue venlafaxine 37.5mg  daily, lyrica 50mg  daily, oxycodone 10mg  q6h prn, Sarna lotion  Anemia of chronic disease: Hgb stable, no evidence of acute blood loss Paroxysmal atrial fibrillation: Continue home Eliquis 5mg  BID, Digoxin 0.125mg  MWF Hypothyroidism: Continue home levothyroxine 25mcg Depression: Continue home venlafaxine 37.5mg   Discussed some goals of care, particularly in relation to intubation. After detailed discussion he remains full code and would like his mother called for discussion if he is going to need intubation in any other scenario.  Diet: Renal diet VTE: Eliquis Code: Full  Dispo: Anticipated discharge to Home pending medical stability.   Cleven Dallas, DO Internal Medicine Resident, PGY-2 Please contact the on call pager at [phone] for any urgent or emergent needs. 6:27 AM 11/06/2023

## 2023-11-06 NOTE — Progress Notes (Signed)
 [facility] Progress Note   Subjective:   Feels like breathing improved post HD yesterday but still thinks he needs more fluid removed and is refusing TDC removal at this time. Denies CP, dizziness.   Objective Vitals:   11/06/23 0510 11/06/23 0516 11/06/23 0654 11/06/23 0906  BP: 94/62 90/77  105/85  Pulse:  84  72  Resp:      Temp: 97.8 F (36.6 C) 98.1 F (36.7 C)    TempSrc: Oral Oral    SpO2:  (!) 68%  100%  Weight:   (!) 184.4 kg   Height:       Physical Exam General: Alert male, laying flat in bed, NAD Heart: RRR, no murmurs, rubs or gallops Lungs: CTA anteriorly, respirations unlabored Abdomen: Soft, +BS Extremities: 2+ pitting edema b/l lower extremities Dialysis Access: R internal jugular [facility]    Additional Objective Labs: Basic Metabolic Panel: Recent Labs  Lab 11/03/23 0501 11/04/23 0614 11/05/23 0713  NA 130* 131* 130*  K 4.1 4.9 4.1  CL 92* 93* 92*  CO2 20* 20* 22  GLUCOSE 133* 94 107*  BUN 49* 50* 36*  CREATININE 9.06* 9.59* 8.02*  CALCIUM 9.3 9.3 8.9  PHOS 6.7* 6.8* 5.7*   Liver Function Tests: Recent Labs  Lab 11/01/23 1004 11/02/23 0523 11/03/23 0501 11/04/23 0614 11/05/23 0713  AST 19  --   --   --   --   ALT 16  --   --   --   --   ALKPHOS 208*  --   --   --   --   BILITOT 1.0  --   --   --   --   PROT 7.2  --   --   --   --   ALBUMIN 3.1*   < > 3.0* 3.0* 2.9*   < > = values in this interval not displayed.   No results for input(s): "LIPASE", "AMYLASE" in the last 168 hours. CBC: Recent Labs  Lab 11/01/23 1004 11/01/23 1020 11/02/23 0523 11/03/23 0501 11/04/23 0614 11/05/23 0713  WBC 8.5  --  7.6 6.9 6.9 7.4  NEUTROABS 5.5  --   --   --   --   --   HGB 10.5*   < > 10.0* 10.1* 10.2* 10.5*  HCT 33.8*   < > 30.2* 31.4* 31.4* 32.4*  MCV 106.6*  --  101.7* 102.3* 101.9* 102.2*  PLT 178  --  175 184 204 210   < > = values in this interval not displayed.   Blood Culture    Component Value Date/Time   SDES  BLOOD LEFT ARM 11/02/2023 0520   SPECREQUEST  11/02/2023 0520    BOTTLES DRAWN AEROBIC AND ANAEROBIC Blood Culture adequate volume   CULT  11/02/2023 0520    NO GROWTH 4 DAYS Performed at [facility], [address], [place], [place] [address]    REPTSTATUS PENDING 11/02/2023 9147    Cardiac Enzymes: No results for input(s): "CKTOTAL", "CKMB", "CKMBINDEX", "TROPONINI" in the last 168 hours. CBG: No results for input(s): "GLUCAP" in the last 168 hours. Iron Studies: No results for input(s): "IRON", "TIBC", "TRANSFERRIN", "FERRITIN" in the last 72 hours. @lablastinr3 @ Studies/Results: ECHOCARDIOGRAM COMPLETE Result Date: 11/05/2023    ECHOCARDIOGRAM REPORT   Patient Name:   Phillip Young Date of Exam: 11/05/2023 Medical Rec #:  [id number]      Height:       72.0 in Accession #:    [id number]  Weight:       415.8 lb Date of Birth:  Mar 20, 1986       BSA:          2.908 m Patient Age:    37 years       BP:           108/89 mmHg Patient Gender: M              HR:           118 bpm. Exam Location:  Inpatient Procedure: 2D Echo, Color Doppler and Cardiac Doppler (Both Spectral and Color            Flow Doppler were utilized during procedure). Indications:    Bacteremia  History:        Patient has prior history of Echocardiogram examinations, most                 recent 09/08/2023. CHF, ESRD, Arrythmias:Atrial Fibrillation;                 Risk Factors:Diabetes and Sleep Apnea.  Sonographer:    Sherline Distel Senior RDCS Referring Phys: [id number] Theotis Flake D CALONE  Sonographer Comments: Technically difficult due to patient body habitus. IMPRESSIONS  1. Left ventricular ejection fraction, by estimation, is <20%. Left ventricular ejection fraction by PLAX is 26 %. The left ventricle has severely decreased function. The left ventricle demonstrates global hypokinesis. The left ventricular internal cavity size was severely dilated. Left ventricular diastolic parameters are indeterminate.  2. Evidence of RV-PA  Uncoupling. Right ventricular systolic function is severely reduced. The right ventricular size is severely enlarged. There is mildly elevated pulmonary artery systolic pressure. The estimated right ventricular systolic pressure is 41.6 mmHg.  3. Left atrial size was moderately dilated.  4. The mitral valve is normal in structure. Moderate mitral valve regurgitation.  5. Vena Contraca width 11 mm. The tricuspid valve is abnormal. Tricuspid valve regurgitation is severe.  6. The aortic valve is tricuspid. Aortic valve regurgitation is not visualized.  7. The inferior vena cava is dilated in size with <50% respiratory variability, suggesting right atrial pressure of 15 mmHg. Comparison(s): Prior images reviewed side by side. Nominal improvement in LV fuction; further dilation of left ventricle; RV dilation is now severe. Conclusion(s)/Recommendation(s): No evidence of valvular vegetations on this transthoracic echocardiogram. Consider a transesophageal echocardiogram to exclude infective endocarditis if clinically indicated. FINDINGS  Left Ventricle: Left ventricular ejection fraction, by estimation, is <20%. Left ventricular ejection fraction by PLAX is 26 %. The left ventricle has severely decreased function. The left ventricle demonstrates global hypokinesis. Strain was performed and the global longitudinal strain is indeterminate. The left ventricular internal cavity size was severely dilated. There is no left ventricular hypertrophy. Left ventricular diastolic parameters are indeterminate. Right Ventricle: Evidence of RV-PA Uncoupling. The right ventricular size is severely enlarged. No increase in right ventricular wall thickness. Right ventricular systolic function is severely reduced. There is mildly elevated pulmonary artery systolic pressure. The tricuspid regurgitant velocity is 2.58 m/s, and with an assumed right atrial pressure of 15 mmHg, the estimated right ventricular systolic pressure is 41.6 mmHg.  Left Atrium: Left atrial size was moderately dilated. Right Atrium: Right atrial size was normal in size. Pericardium: There is no evidence of pericardial effusion. Mitral Valve: The mitral valve is normal in structure. Moderate mitral valve regurgitation. Tricuspid Valve: Vena Contraca width 11 mm. The tricuspid valve is abnormal. Tricuspid valve regurgitation is severe. Aortic Valve: The aortic valve is tricuspid. Aortic  valve regurgitation is not visualized. Pulmonic Valve: The pulmonic valve was normal in structure. Pulmonic valve regurgitation is mild. No evidence of pulmonic stenosis. Aorta: The aortic root and ascending aorta are structurally normal, with no evidence of dilitation. Venous: The inferior vena cava is dilated in size with less than 50% respiratory variability, suggesting right atrial pressure of 15 mmHg. IAS/Shunts: The atrial septum is grossly normal. Additional Comments: 3D was performed not requiring image post processing on an independent workstation and was indeterminate.  LEFT VENTRICLE PLAX 2D LV EF:         Left ventricular ejection fraction by PLAX is 26 %. LVIDd:         7.10 cm LVIDs:         6.20 cm LV PW:         0.90 cm LV IVS:        0.80 cm LVOT diam:     2.30 cm LV SV:         39 LV SV Index:   14 LVOT Area:     4.15 cm  RIGHT VENTRICLE RV S prime:     6.42 cm/s TAPSE (M-mode): 1.1 cm LEFT ATRIUM            Index        RIGHT ATRIUM           Index LA diam:      4.40 cm  1.51 cm/m   RA Area:     26.70 cm LA Vol (A2C): 130.0 ml 44.71 ml/m  RA Volume:   88.10 ml  30.30 ml/m LA Vol (A4C): 102.0 ml 35.08 ml/m  AORTIC VALVE LVOT Vmax:   64.20 cm/s LVOT Vmean:  49.000 cm/s LVOT VTI:    0.094 m  AORTA Ao Root diam: 3.00 cm Ao Asc diam:  2.70 cm TRICUSPID VALVE TR Peak grad:   26.6 mmHg TR Vmax:        258.00 cm/s  SHUNTS Systemic VTI:  0.09 m Systemic Diam: 2.30 cm Gloriann Larger MD Electronically signed by Gloriann Larger MD Signature Date/Time: 11/05/2023/12:33:49 PM     Final    Medications:  [START ON 11/07/2023] vancomycin      apixaban  5 mg Oral BID   Chlorhexidine Gluconate Cloth  6 each Topical Q0600   Chlorhexidine Gluconate Cloth  6 each Topical Q0600   cinacalcet  30 mg Oral Q M,W,F-HD   digoxin  0.125 mg Oral Q M,W,F   levothyroxine  25 mcg Oral Q0600   midodrine  30 mg Oral TID WC   pregabalin  50 mg Oral Daily   sevelamer carbonate  1,600 mg Oral TID WC   venlafaxine XR  37.5 mg Oral Daily    OP Dialysis Orders: MWF - [facility] 4:30hr, 400/800, EDW 155.5kg, 2K/2Ca, TDC, no heparin (HIT) - Mircera 200mcg IV q 2 weeks - last 4/4 - Sensipar 30mg  PO HD  Assessment/Plan:  Acute on Chronic Resp Failure: Chronically overloaded. Improved but he still feels short of breath. Extra HD again tonight if HD schedule permits  Sacral Cellulitis: ID recommending line holiday. Pt refused yesterday and refuses now, Feels he needs more fluid removed before procedure.   On abx per ID. Will regroup with IR on Monday to determine timing of TDC removal.   ESRD: Normally MWF schedule, extra HD today for volume as above  Hypotension/volume: BP low, stable on mido. Severe edema  Anemia: Hgb 10.5 - just given ESA as outpatient.  Metabolic  bone disease: Ca/Phos pending. Continue home binders/sensipar.    Ramona Burner, PA-C 11/06/2023, 9:28 AM  [facility] Pager: [phone]

## 2023-11-06 NOTE — Progress Notes (Signed)
 [name] home unit at bedside and pt places self on/off.

## 2023-11-07 ENCOUNTER — Inpatient Hospital Stay (HOSPITAL_COMMUNITY)

## 2023-11-07 ENCOUNTER — Other Ambulatory Visit: Payer: Self-pay | Admitting: Student

## 2023-11-07 DIAGNOSIS — F4323 Adjustment disorder with mixed anxiety and depressed mood: Secondary | ICD-10-CM | POA: Diagnosis not present

## 2023-11-07 DIAGNOSIS — I482 Chronic atrial fibrillation, unspecified: Secondary | ICD-10-CM | POA: Diagnosis not present

## 2023-11-07 DIAGNOSIS — R7881 Bacteremia: Secondary | ICD-10-CM | POA: Diagnosis not present

## 2023-11-07 DIAGNOSIS — J9621 Acute and chronic respiratory failure with hypoxia: Secondary | ICD-10-CM | POA: Diagnosis not present

## 2023-11-07 HISTORY — PX: IR REMOVAL TUN CV CATH W/O FL: IMG2289

## 2023-11-07 LAB — CULTURE, BLOOD (ROUTINE X 2)
Culture: NO GROWTH
Culture: NO GROWTH
Special Requests: ADEQUATE
Special Requests: ADEQUATE

## 2023-11-07 LAB — RENAL FUNCTION PANEL
Albumin: 3.3 g/dL — ABNORMAL LOW (ref 3.5–5.0)
Anion gap: 14 (ref 5–15)
BUN: 18 mg/dL (ref 6–20)
CO2: 23 mmol/L (ref 22–32)
Calcium: 9.4 mg/dL (ref 8.9–10.3)
Chloride: 95 mmol/L — ABNORMAL LOW (ref 98–111)
Creatinine, Ser: 5.32 mg/dL — ABNORMAL HIGH (ref 0.61–1.24)
GFR, Estimated: 13 mL/min — ABNORMAL LOW (ref >60)
Glucose, Bld: 80 mg/dL (ref 70–99)
Phosphorus: 4.3 mg/dL (ref 2.5–4.6)
Potassium: 4.2 mmol/L (ref 3.5–5.1)
Sodium: 132 mmol/L — ABNORMAL LOW (ref 135–145)

## 2023-11-07 MED ORDER — VANCOMYCIN VARIABLE DOSE PER UNSTABLE RENAL FUNCTION (PHARMACIST DOSING): Status: DC

## 2023-11-07 MED ORDER — LIDOCAINE HCL 1 % IJ SOLN
20.0000 mL | Freq: Once | INTRAMUSCULAR | Status: AC
Start: 2023-11-07 — End: 2023-11-07
  Administered 2023-11-07: 20 mL

## 2023-11-07 MED ORDER — LIDOCAINE HCL 1 % IJ SOLN
INTRAMUSCULAR | Status: AC
  Filled 2023-11-07: qty 20

## 2023-11-07 MED ORDER — VANCOMYCIN HCL IN DEXTROSE 1-5 GM/200ML-% IV SOLN
1000.0000 mg | Freq: Once | INTRAVENOUS | Status: AC
  Administered 2023-11-07: 1000 mg via INTRAVENOUS
  Filled 2023-11-07: qty 200

## 2023-11-07 NOTE — Progress Notes (Signed)
 Heart Failure Navigator Progress Note  Assessed for Heart & Vascular TOC clinic readiness.  Patient does not meet criteria due to ESRD on hemodialysis.   Navigator will sign off at this time.   Rhae Hammock, BSN, Scientist, clinical (histocompatibility and immunogenetics) Only

## 2023-11-07 NOTE — Plan of Care (Signed)
  Problem: Education: Goal: Knowledge of disease and its progression will improve Outcome: Progressing   

## 2023-11-07 NOTE — Progress Notes (Signed)
  [facility] Progress Note   Subjective: finished HD early this AM. Net UF 6.5 liters. Went to IR for removal of TDC for line holiday.   Objective Vitals:   11/07/23 0349 11/07/23 0500 11/07/23 0610 11/07/23 0826  BP: 103/76  (!) 88/77 104/79  Pulse: 74  78 (!) 109  Resp: 19   20  Temp: 97.8 F (36.6 C)  98 F (36.7 C)   TempSrc: Oral  Oral   SpO2: 98%  98% 100%  Weight:  (!) 182.9 kg    Height:       Physical Exam General: obese male in NAD Heart: RRR, no murmurs, rubs or gallops Lungs: CTA anteriorly, respirations unlabored Abdomen: Soft, +BS Extremities: 2+ pitting edema b/l lower extremities Dialysis Access: No access.   Additional Objective Labs: Basic Metabolic Panel: Recent Labs  Lab 11/05/23 0713 11/06/23 0832 11/07/23 0457  NA 130* 132* 132*  K 4.1 4.4 4.2  CL 92* 95* 95*  CO2 22 22 23   GLUCOSE 107* 91 80  BUN 36* 28* 18  CREATININE 8.02* 6.87* 5.32*  CALCIUM 8.9 9.4 9.4  PHOS 5.7* 5.1* 4.3   Liver Function Tests: Recent Labs  Lab 11/01/23 1004 11/02/23 0523 11/05/23 0713 11/06/23 0832 11/07/23 0457  AST 19  --   --   --   --   ALT 16  --   --   --   --   ALKPHOS 208*  --   --   --   --   BILITOT 1.0  --   --   --   --   PROT 7.2  --   --   --   --   ALBUMIN 3.1*   < > 2.9* 3.0* 3.3*   < > = values in this interval not displayed.   No results for input(s): "LIPASE", "AMYLASE" in the last 168 hours. CBC: Recent Labs  Lab 11/01/23 1004 11/01/23 1020 11/02/23 0523 11/03/23 0501 11/04/23 0614 11/05/23 0713 11/06/23 1955  WBC 8.5  --  7.6 6.9 6.9 7.4 7.6  NEUTROABS 5.5  --   --   --   --   --   --   HGB 10.5*   < > 10.0* 10.1* 10.2* 10.5* 10.9*  HCT 33.8*   < > 30.2* 31.4* 31.4* 32.4* 33.8*  MCV 106.6*  --  101.7* 102.3* 101.9* 102.2* 101.2*  PLT 178  --  175 184 204 210 235   < > = values in this interval not displayed.   Blood Culture    Component Value Date/Time   SDES BLOOD LEFT ARM 11/02/2023 0520    SPECREQUEST  11/02/2023 0520    BOTTLES DRAWN AEROBIC AND ANAEROBIC Blood Culture adequate volume   CULT  11/02/2023 0520    NO GROWTH 5 DAYS Performed at [facility], [address] [place], [place] [address]    REPTSTATUS 11/07/2023 FINAL 11/02/2023 0520    Cardiac Enzymes: No results for input(s): "CKTOTAL", "CKMB", "CKMBINDEX", "TROPONINI" in the last 168 hours. CBG: No results for input(s): "GLUCAP" in the last 168 hours. Iron Studies: No results for input(s): "IRON", "TIBC", "TRANSFERRIN", "FERRITIN" in the last 72 hours. @lablastinr3 @ Studies/Results: IR Removal Tun Cv Cath W/O FL Result Date: 11/07/2023 INDICATION: 38 year old male with sacral cellulitis on antibiotics. Infectious disease recommending line holiday. Request for hemodialysis catheter removal. EXAM: REMOVAL OF TUNNELED HEMODIALYSIS CATHETER MEDICATIONS: 1% Lidocaine, 4 mL. COMPLICATIONS: None immediate. PROCEDURE: Informed written consent was obtained from the  patient following an explanation of the procedure, risks, benefits and alternatives to treatment. A time out was performed prior to the initiation of the procedure. Sterile technique was utilized including mask, sterile gloves, sterile drape, and hand hygiene. ChloraPrep was used to prep the patient's right neck, chest and existing catheter. The catheter was removed intact by applying manual retraction. Hemostasis was obtained with manual compression. A dressing was placed. The patient tolerated the procedure well without immediate post procedural complication. IMPRESSION: Successful removal of tunneled central venous catheter. Procedure performed by: Estella Helling, PA-C Electronically Signed   By: Melven Stable.  Shick M.D.   On: 11/07/2023 11:47   Medications:  vancomycin 1,000 mg (11/07/23 1350)    apixaban  5 mg Oral BID   Chlorhexidine Gluconate Cloth  6 each Topical Q0600   cinacalcet  30 mg Oral Q M,W,F-HD   digoxin  0.125 mg Oral Q M,W,F   levothyroxine   25 mcg Oral Q0600   midodrine  30 mg Oral TID WC   pregabalin  50 mg Oral Daily   sevelamer carbonate  1,600 mg Oral TID WC   vancomycin variable dose per unstable renal function (pharmacist dosing)   Does not apply See admin instructions   venlafaxine XR  37.5 mg Oral Daily     OP Dialysis Orders: MWF - [facility] 4:30hr, 400/800, EDW 155.5kg, 2K/2Ca, TDC, no heparin (HIT) - Mircera 200mcg IV q 2 weeks - last 4/4 - Sensipar 30mg  PO HD   Assessment/Plan:  Acute on Chronic Resp Failure: Chronically overloaded. Improved but he still feels short of breath. Extra HD overnight net UF 6.5 liters.   Sacral Cellulitis: ID recommending line holiday.  On abx per ID. Finally agreed to have TDC removed, removed in IR today. Will need to replace 11/10/2023. If urgent HD needs, will need Temp Cath placed.   ESRD: Normally MWF schedule. Next HD 11/10/2023. Hoping he can go that long and will not need temp catheter.   Hypotension/volume: BP low, stable on mido. Severe edema-chronic issue. UF as tolerated.   Anemia: Hgb 10.5 - just given ESA as outpatient.  Metabolic bone disease: Corrected Ca+ upper limit of normal. No VDRA. PO4 at goal. Continue home binders/sensipar.  Kaitlyn Franko H. Alaa Eyerman NP-C 11/07/2023, 2:46 PM  [employer] [phone]

## 2023-11-07 NOTE — Progress Notes (Signed)
   11/07/23 2300  BiPAP/CPAP/SIPAP  $ Non-Invasive Home Ventilator  Subsequent  BiPAP/CPAP/SIPAP Pt Type Adult  Reason BIPAP/CPAP not in use Other(comment) (Pt states he will place on himself.)  Mask Type Full face mask  EPAP 5 cmH2O  FiO2 (%) 21 %  Patient Home Machine Yes  Safety Check Completed by RT for Home Unit Yes, no issues noted  Patient Home Mask Yes  Patient Home Tubing Yes  Auto Titrate No  Press High Alarm 30 cmH2O  Device Plugged into RED Power Outlet Yes  BiPAP/CPAP /SiPAP Vitals  Pulse Rate 90  SpO2 96 %  Bilateral Breath Sounds Clear;Diminished  MEWS Score/Color  MEWS Score 1  MEWS Score Color Green

## 2023-11-07 NOTE — Plan of Care (Signed)
  Problem: Education: Goal: Knowledge of disease and its progression will improve Outcome: Progressing   Problem: Fluid Volume: Goal: Compliance with measures to maintain balanced fluid volume will improve Outcome: Progressing   Problem: Health Behavior/Discharge Planning: Goal: Ability to manage health-related needs will improve Outcome: Progressing   Problem: Nutritional: Goal: Ability to make healthy dietary choices will improve Outcome: Progressing   Problem: Clinical Measurements: Goal: Complications related to the disease process, condition or treatment will be avoided or minimized Outcome: Progressing   Problem: Education: Goal: Knowledge of General Education information will improve Description: Including pain rating scale, medication(s)/side effects and non-pharmacologic comfort measures Outcome: Progressing   Problem: Health Behavior/Discharge Planning: Goal: Ability to manage health-related needs will improve Outcome: Progressing   Problem: Clinical Measurements: Goal: Ability to maintain clinical measurements within normal limits will improve Outcome: Progressing Goal: Will remain free from infection Outcome: Progressing Goal: Diagnostic test results will improve Outcome: Progressing Goal: Respiratory complications will improve Outcome: Progressing Goal: Cardiovascular complication will be avoided Outcome: Progressing   Problem: Activity: Goal: Risk for activity intolerance will decrease Outcome: Progressing   Problem: Nutrition: Goal: Adequate nutrition will be maintained Outcome: Progressing   Problem: Coping: Goal: Level of anxiety will decrease Outcome: Progressing   Problem: Elimination: Goal: Will not experience complications related to bowel motility Outcome: Progressing Goal: Will not experience complications related to urinary retention Outcome: Progressing   Problem: Pain Managment: Goal: General experience of comfort will improve  and/or be controlled Outcome: Progressing   Problem: Safety: Goal: Ability to remain free from injury will improve Outcome: Progressing   Problem: Skin Integrity: Goal: Risk for impaired skin integrity will decrease Outcome: Progressing

## 2023-11-07 NOTE — Progress Notes (Signed)
                  Subjective:   Summary: Phillip Young is a 38 yo male with chronic hypoxic respiratory failure on 2-4L home O2, HFrEF w/LVEF <20%, Afib on Eliquis, chronic pain, ESRD on HD MWF who presented to the ED with shortness of breath. He is being treated for volume overload due to missed HD session as well as E faecalis and MRSE bacteremia  Patient tolerated his catheter removal well today. He feels less dyspneic and edematous after HD last night. He understands the need for line holiday and fluid restriction during this time he will not be having HD. All questions answered  Objective:  Vital signs in last 24 hours: Vitals:   11/07/23 0349 11/07/23 0500 11/07/23 0610 11/07/23 0826  BP: 103/76  (!) 88/77 104/79  Pulse: 74  78 (!) 109  Resp: 19   20  Temp: 97.8 F (36.6 C)  98 F (36.7 C)   TempSrc: Oral  Oral   SpO2: 98%  98% 100%  Weight:  (!) 182.9 kg    Height:       Supplemental O2: Nasal Cannula SpO2: 100 % O2 Flow Rate (L/min): 5 L/min FiO2 (%): 21 %  Physical Exam:  Const: chronically ill appearing. Fatigued, soft voice. No acute distress Neuro: A&O x 3  Assessment/Plan:   Enterococcus faecalis and MRSE bacteremia Blood cultures positive for Enterococcus faecalis and staph epidermitis. Patient remains relatively asymptomatic with no fever, leukocytosis or other systemic symptoms. Repeat blood cultures from 4/9 without growth. 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 At/near baseline - HD per nephrology  Chronic somatic and neuropathic pain Continue venlafaxine 37.5mg  daily, lyrica  50mg  daily, oxycodone 10mg  q6h prn, Sarna lotion  Anemia of chronic disease: Hgb stable, no evidence of acute blood loss Paroxysmal atrial fibrillation: Continue home Eliquis 5mg  BID, Digoxin 0.125mg  MWF Hypothyroidism: Continue home levothyroxine 25mcg Depression: Continue home venlafaxine 37.5mg   Diet: Renal VTE: Eliquis Code: Full  Dispo: Anticipated discharge to Home pending medical stability.   [name], MD Internal Medicine Resident, PGY-1 Please contact the on call pager at [phone] for any urgent or emergent needs. 10:39 AM 11/07/2023

## 2023-11-07 NOTE — Telephone Encounter (Signed)
 Copied from CRM [id number]. Topic: Clinical - Medication Refill >> Nov 07, 2023  8:05 AM [name] F wrote: Most Recent Primary Care Visit:  Provider: Lawson Prey  Department: [facility]  Visit Type: MEDICARE AWV, INITIAL  Date: 10/06/2023   Patient is currentky in the hospital and would like the nmedication in place for when he returns home.   Medication: Oxycodone HCl 10 MG TABS  Has the patient contacted their pharmacy? Yes   Is this the correct pharmacy for this prescription? Yes  This is the patient's preferred pharmacy:   [id number] - [place], [place] - [address] AT [facility] OF [address] [place] [id number] Phone: [phone] Fax: [phone]   Has the prescription been filled recently? No  Is the patient out of the medication? Yes  Has the patient been seen for an appointment in the last year OR does the patient have an upcoming appointment? Yes  Can we respond through MyChart? Yes  Agent: Please be advised that Rx refills may take up to 3 business days. We ask that you follow-up with your pharmacy.

## 2023-11-07 NOTE — Procedures (Signed)
 Interventional Radiology Procedure Note  PROCEDURE SUMMARY:  Successful removal of tunneled hemodialysis catheter.  No complications.   EBL = trace  Please see full dictation in imaging section of Epic for procedure details.  Electronically Signed: Jann Ra M Damian Buckles, PA-C 11/07/2023, 11:45 AM

## 2023-11-08 ENCOUNTER — Encounter: Admitting: Internal Medicine

## 2023-11-08 DIAGNOSIS — F4323 Adjustment disorder with mixed anxiety and depressed mood: Secondary | ICD-10-CM

## 2023-11-08 DIAGNOSIS — I482 Chronic atrial fibrillation, unspecified: Secondary | ICD-10-CM | POA: Diagnosis not present

## 2023-11-08 DIAGNOSIS — Z9189 Other specified personal risk factors, not elsewhere classified: Secondary | ICD-10-CM

## 2023-11-08 DIAGNOSIS — R7881 Bacteremia: Secondary | ICD-10-CM | POA: Diagnosis not present

## 2023-11-08 DIAGNOSIS — J9621 Acute and chronic respiratory failure with hypoxia: Secondary | ICD-10-CM | POA: Diagnosis not present

## 2023-11-08 DIAGNOSIS — B957 Other staphylococcus as the cause of diseases classified elsewhere: Secondary | ICD-10-CM

## 2023-11-08 DIAGNOSIS — Z1629 Resistance to other single specified antibiotic: Secondary | ICD-10-CM

## 2023-11-08 LAB — RENAL FUNCTION PANEL
Albumin: 3 g/dL — ABNORMAL LOW (ref 3.5–5.0)
Anion gap: 15 (ref 5–15)
BUN: 27 mg/dL — ABNORMAL HIGH (ref 6–20)
CO2: 24 mmol/L (ref 22–32)
Calcium: 9.8 mg/dL (ref 8.9–10.3)
Chloride: 94 mmol/L — ABNORMAL LOW (ref 98–111)
Creatinine, Ser: 7.2 mg/dL — ABNORMAL HIGH (ref 0.61–1.24)
GFR, Estimated: 9 mL/min — ABNORMAL LOW (ref >60)
Glucose, Bld: 95 mg/dL (ref 70–99)
Phosphorus: 5.6 mg/dL — ABNORMAL HIGH (ref 2.5–4.6)
Potassium: 4.7 mmol/L (ref 3.5–5.1)
Sodium: 133 mmol/L — ABNORMAL LOW (ref 135–145)

## 2023-11-08 LAB — CBC
HCT: 34.8 % — ABNORMAL LOW (ref 39.0–52.0)
Hemoglobin: 11.1 g/dL — ABNORMAL LOW (ref 13.0–17.0)
MCH: 32.8 pg (ref 26.0–34.0)
MCHC: 31.9 g/dL (ref 30.0–36.0)
MCV: 103 fL — ABNORMAL HIGH (ref 80.0–100.0)
Platelets: 218 10*3/uL (ref 150–400)
RBC: 3.38 MIL/uL — ABNORMAL LOW (ref 4.22–5.81)
RDW: 16.3 % — ABNORMAL HIGH (ref 11.5–15.5)
WBC: 8.1 10*3/uL (ref 4.0–10.5)
nRBC: 0 % (ref 0.0–0.2)

## 2023-11-08 MED ORDER — FENTANYL 12 MCG/HR TD PT72
1.0000 | MEDICATED_PATCH | TRANSDERMAL | Status: DC
  Administered 2023-11-08 – 2023-11-11 (×2): 1 via TRANSDERMAL
  Filled 2023-11-08 (×2): qty 1

## 2023-11-08 MED ORDER — VANCOMYCIN IV (FOR PTA / DISCHARGE USE ONLY)
1000.0000 mg | INTRAVENOUS | Status: DC
Start: 2023-11-09 — End: 2023-12-15

## 2023-11-08 NOTE — Plan of Care (Signed)
  Problem: Education: Goal: Knowledge of disease and its progression will improve Outcome: Progressing   Problem: Fluid Volume: Goal: Compliance with measures to maintain balanced fluid volume will improve Outcome: Progressing   Problem: Health Behavior/Discharge Planning: Goal: Ability to manage health-related needs will improve Outcome: Progressing   Problem: Nutritional: Goal: Ability to make healthy dietary choices will improve Outcome: Progressing   Problem: Clinical Measurements: Goal: Complications related to the disease process, condition or treatment will be avoided or minimized Outcome: Progressing   Problem: Education: Goal: Knowledge of General Education information will improve Description: Including pain rating scale, medication(s)/side effects and non-pharmacologic comfort measures Outcome: Progressing   Problem: Health Behavior/Discharge Planning: Goal: Ability to manage health-related needs will improve Outcome: Progressing   Problem: Clinical Measurements: Goal: Ability to maintain clinical measurements within normal limits will improve Outcome: Progressing Goal: Will remain free from infection Outcome: Progressing Goal: Diagnostic test results will improve Outcome: Progressing Goal: Respiratory complications will improve Outcome: Progressing Goal: Cardiovascular complication will be avoided Outcome: Progressing   Problem: Activity: Goal: Risk for activity intolerance will decrease Outcome: Progressing   Problem: Nutrition: Goal: Adequate nutrition will be maintained Outcome: Progressing   Problem: Coping: Goal: Level of anxiety will decrease Outcome: Progressing   Problem: Elimination: Goal: Will not experience complications related to bowel motility Outcome: Progressing Goal: Will not experience complications related to urinary retention Outcome: Progressing   Problem: Pain Managment: Goal: General experience of comfort will improve  and/or be controlled Outcome: Progressing   Problem: Safety: Goal: Ability to remain free from injury will improve Outcome: Progressing   Problem: Skin Integrity: Goal: Risk for impaired skin integrity will decrease Outcome: Progressing

## 2023-11-08 NOTE — Progress Notes (Signed)
   11/08/23 2319  BiPAP/CPAP/SIPAP  $ Non-Invasive Home Ventilator  Subsequent  BiPAP/CPAP/SIPAP Pt Type Adult  Reason BIPAP/CPAP not in use  (pt states he will place BIPAP on himself)  Mask Type Full face mask  Mask Size Medium  EPAP 5 cmH2O  PEEP 5 cmH20  FiO2 (%) 21 %  Patient Home Machine Yes  Safety Check Completed by RT for Home Unit Yes, no issues noted  Patient Home Mask Yes  Patient Home Tubing Yes  Auto Titrate No  Device Plugged into RED Power Outlet Yes  BiPAP/CPAP /SiPAP Vitals  Pulse Rate 76  Resp 20  SpO2 98 %  Bilateral Breath Sounds Clear  MEWS Score/Color  MEWS Score 0  MEWS Score Color Phillip Young

## 2023-11-08 NOTE — Progress Notes (Signed)
 Palliative Care consult received and chart reviewed in detail. Request for assistance with pain and symptom management as well as goals of care. Please see detailed consultation note done 07/02/23. We are happy to readdress goals of care if patient expressing a desire to have additional conversation. In terms of his complex pain management, he has difficult to manage neuropathic pain. He has been on Hydromorphone 4mg  q4 hours PRN and also  Oxycodone 10mg  intermittently and without consistency based on his PDMP review. It does not  Lyrica and Effexor for neuropathic pain. Challenging with hemodialysis and he also needs a long acting opioid with lowest risk of side effects and sedation. The best oral medication for him to be on chronically would be low dose methadone 5mg  BID however he would need a consistent and steady prescriber outpatient-If [facility] is willing to manage this and follow safe prescribing practices it would be reasonable to begin this regimen in the hospital. We could also request outpatient palliative care work with his PCP to manage his pain after hospitalization. If this is not possible, I would recommend lowest dose fentanyl patch at 12.5 mcg and using oxycodone for breakthrough pain. Additionally his Lyrica can be titrated up as long as he is not having sedation from this.  If there are question or concerns please message me directly.  Flora Humphreys, DO Palliative Medicine

## 2023-11-08 NOTE — Progress Notes (Signed)
 [name] Progress Note   Subjective: Sitting up at bedside. No complaints. Says he is watching fluids, doesn't want a temp cath placed. Denies SOB. Mother face timing. Updated on all parameters.     Objective Vitals:   11/07/23 2300 11/08/23 0500 11/08/23 0504 11/08/23 0835  BP:   117/73 (!) 132/107  Pulse: 90  (!) 45 (!) 109  Resp:   15 18  Temp:   97.7 F (36.5 C) 98.6 F (37 C)  TempSrc:   Oral   SpO2: 96%  100% 100%  Weight:  (!) 180.8 kg    Height:       Physical Exam General: obese male in NAD Heart: RRR, no murmurs, rubs or gallops Lungs: CTA anteriorly, respirations unlabored Abdomen: Soft, +BS Extremities: 2+ pitting edema b/l lower extremities Dialysis Access: No access.   Additional Objective Labs: Basic Metabolic Panel: Recent Labs  Lab 11/06/23 0832 11/07/23 0457 11/08/23 0500  NA 132* 132* 133*  K 4.4 4.2 4.7  CL 95* 95* 94*  CO2 22 23 24   GLUCOSE 91 80 95  BUN 28* 18 27*  CREATININE 6.87* 5.32* 7.20*  CALCIUM 9.4 9.4 9.8  PHOS 5.1* 4.3 5.6*   Liver Function Tests: Recent Labs  Lab 11/06/23 0832 11/07/23 0457 11/08/23 0500  ALBUMIN 3.0* 3.3* 3.0*   No results for input(s): "LIPASE", "AMYLASE" in the last 168 hours. CBC: Recent Labs  Lab 11/03/23 0501 11/04/23 0614 11/05/23 0713 11/06/23 1955 11/08/23 0500  WBC 6.9 6.9 7.4 7.6 8.1  HGB 10.1* 10.2* 10.5* 10.9* 11.1*  HCT 31.4* 31.4* 32.4* 33.8* 34.8*  MCV 102.3* 101.9* 102.2* 101.2* 103.0*  PLT 184 204 210 235 218   Blood Culture    Component Value Date/Time   SDES BLOOD RIGHT HAND 11/07/2023 1953   SPECREQUEST  11/07/2023 1953    BOTTLES DRAWN AEROBIC AND ANAEROBIC Blood Culture adequate volume   CULT  11/07/2023 1953    NO GROWTH < 12 HOURS Performed at [facility], [address] [place], [place] [address]    REPTSTATUS PENDING 11/07/2023 1953    Cardiac Enzymes: No results for input(s): "CKTOTAL", "CKMB", "CKMBINDEX", "TROPONINI" in the last  168 hours. CBG: No results for input(s): "GLUCAP" in the last 168 hours. Iron Studies: No results for input(s): "IRON", "TIBC", "TRANSFERRIN", "FERRITIN" in the last 72 hours. @lablastinr3 @ Studies/Results: IR Removal Tun Cv Cath W/O FL Result Date: 11/07/2023 INDICATION: 38 year old male with sacral cellulitis on antibiotics. Infectious disease recommending line holiday. Request for hemodialysis catheter removal. EXAM: REMOVAL OF TUNNELED HEMODIALYSIS CATHETER MEDICATIONS: 1% Lidocaine, 4 mL. COMPLICATIONS: None immediate. PROCEDURE: Informed written consent was obtained from the patient following an explanation of the procedure, risks, benefits and alternatives to treatment. A time out was performed prior to the initiation of the procedure. Sterile technique was utilized including mask, sterile gloves, sterile drape, and hand hygiene. ChloraPrep was used to prep the patient's right neck, chest and existing catheter. The catheter was removed intact by applying manual retraction. Hemostasis was obtained with manual compression. A dressing was placed. The patient tolerated the procedure well without immediate post procedural complication. IMPRESSION: Successful removal of tunneled central venous catheter. Procedure performed by: [name], PA-C Electronically Signed   By: Judie Petit.  Shick M.D.   On: 11/07/2023 11:47   Medications:   apixaban  5 mg Oral BID   Chlorhexidine Gluconate Cloth  6 each Topical Q0600   cinacalcet  30 mg Oral Q M,W,F-HD   digoxin  0.125 mg Oral Q M,W,F   levothyroxine  25 mcg Oral Q0600   midodrine  30 mg Oral TID WC   pregabalin  50 mg Oral Daily   sevelamer carbonate  1,600 mg Oral TID WC   vancomycin variable dose per unstable renal function (pharmacist dosing)   Does not apply See admin instructions   venlafaxine XR  37.5 mg Oral Daily     OP Dialysis Orders: MWF - [facility] 4:30hr, 400/800, EDW 155.5kg, 2K/2Ca, TDC, no heparin (HIT) - Mircera 200mcg IV q 2 weeks -  last 4/4 - Sensipar 30mg  PO HD   Assessment/Plan:  Acute on Chronic Resp Failure: Chronically overloaded. Improved but he still feels short of breath. Extra HD overnight net UF 6.5 liters.   Sacral Cellulitis: ID recommending line holiday.  On abx per ID. Finally agreed to have TDC removed, removed in IR today. Will need to replace 11/10/2023. If urgent HD needs, will need Temp Cath placed.   ESRD: Normally MWF schedule. Next HD 11/10/2023. Hoping he can go that long and will not need temp catheter.   Hypotension/volume: BP low, stable on mido. Severe edema-chronic issue. UF as tolerated.   Anemia: Hgb 10.5 - just given ESA as outpatient.  Metabolic bone disease: Corrected Ca+ upper limit of normal. No VDRA. PO4 at goal. Continue home binders/sensipar.  Janica Eldred H. Aariel Ems NP-C 11/08/2023, 11:47 AM  [employer] [phone]

## 2023-11-08 NOTE — Progress Notes (Signed)
 PHARMACY CONSULT NOTE FOR:  OUTPATIENT  PARENTERAL ANTIBIOTIC THERAPY (OPAT)  Informational as the patient will be receiving antibiotics at the HD center outpatient  Indication: MRSE + E faecalis bacteremia, deferring TEE Regimen: Vancomycin 1g/HD-MWF End date: 12/18/23  Given the patient's BMI of 54, a higher dose may need to be given with each HD session (I.e. 1250 mg/HD) however will need to be done based on levels and adjustments as needed inpatient or at the HD center.   Goal pre-HD Vancomycin level 15-25 mcg/ml  IV antibiotic discharge orders are pended. To discharging provider:  please sign these orders via discharge navigator,  Select New Orders & click on the button choice - Manage This Unsigned Work.     Thank you for allowing pharmacy to be a part of this patient's care.  Garland Junk, PharmD, BCPS, BCIDP Infectious Diseases Clinical Pharmacist 11/08/2023 3:35 PM   **Pharmacist phone directory can now be found on [email or web address] ([email or web address]).  Listed under [facility].

## 2023-11-08 NOTE — Care Management Important Message (Signed)
 Important Message  Patient Details  Name: Phillip Young MRN: [id number] Date of Birth: 09/19/1985   Important Message Given:  Yes - Medicare IM     Wynonia Hedges 11/08/2023, 3:29 PM

## 2023-11-08 NOTE — Progress Notes (Signed)
                  Subjective:   Summary: Phillip Young is a 38 yo male with chronic hypoxic respiratory failure on 2-4L home O2, HFrEF w/LVEF <20%, Afib on Eliquis, chronic pain, ESRD on HD MWF who presented to the ED with shortness of breath. He is being treated for volume overload due to missed HD session as well as E faecalis and MRSE bacteremia  Patient feels less dyspneic and edematous today.  He is agreeable to having a new TDC placed on 4/17.  He is concerned about his treatment during past hospitalizations and would like to talk to patient advocacy.  He is working to limit fluids while he has his catheter out.  Objective:  Vital signs in last 24 hours: Vitals:   11/07/23 2300 11/08/23 0500 11/08/23 0504 11/08/23 0835  BP:   117/73 (!) 132/107  Pulse: 90  (!) 45 (!) 109  Resp:   15 18  Temp:   97.7 F (36.5 C) 98.6 F (37 C)  TempSrc:   Oral   SpO2: 96%  100% 100%  Weight:  (!) 180.8 kg    Height:       Supplemental O2: Nasal Cannula SpO2: 100 % O2 Flow Rate (L/min): 5 L/min FiO2 (%): 21 %  Physical Exam:  Const: chronically ill appearing. Fatigued, soft voice. No acute distress Neuro: A&O x 3  Assessment/Plan:   Enterococcus faecalis and MRSE bacteremia Blood cultures positive for Enterococcus faecalis and staph epidermitis. Patient remains relatively asymptomatic with no fever, leukocytosis or other systemic symptoms. Repeat blood cultures from 4/9 and 4/14 with NGTD. TDC was pulled 4/14, with a 2-3 day line holiday after. After discussion with ID, TEE may not be necessary since tolerating the anesthesia will be a concern for him. Instead, will plan to treat empirically with 6 weeks of IV vancomycin during HD.  - Vancomycin for 6 week course during HD, end date May 26 - ID following, appreciate recommendations - S/p TDC removal 4/14 - Line holiday for 2-3 days starting 4/14, plan to replaced [facility] 4/17  Acute on chronic hypoxic respiratory failure  Hx presumed  amiodarone toxicity HFrEF, LVEF <20% ESRD on HD Patient continues to require 4-6L [place] to maintain appropriate O2 saturations. Near his baseline of 2-4L [place] chronically - HD per nephrology, currently on line holiday until [facility] replacement on 4/17  Chronic somatic and neuropathic pain Palliative team has seen patient and recommended methadone or fentanyl patch for his severe chronic leg pain. I feel a fentanyl patch will be easier to manage long term, particularly as transportation to PCP appointments has been a struggle in the past.  - Fentanyl patch 12 mcg/hr q72hr starting 4/15 - Continue venlafaxine 37.5mg  daily, lyrica 50mg  daily, oxycodone 10mg  q6h prn, Sarna lotion  Anemia of chronic disease: Hgb stable, no evidence of acute blood loss Paroxysmal atrial fibrillation: Continue home Eliquis 5mg  BID, Digoxin 0.125mg  MWF Hypothyroidism: Continue home levothyroxine 25mcg Depression: Continue home venlafaxine 37.5mg   Diet: Renal VTE: Eliquis Code: Full  Dispo: Anticipated discharge to Home pending medical stability.   Phillip Michael, MD Internal Medicine Resident, PGY-1 Please contact the on call pager at [phone] for any urgent or emergent needs. 12:49 PM 11/08/2023

## 2023-11-08 NOTE — Progress Notes (Addendum)
 Subjective: No new complaints   Antibiotics:  Anti-infectives (From admission, onward)    Start     Dose/Rate Route Frequency Ordered Stop   11/07/23 1230  vancomycin (VANCOCIN) IVPB 1000 mg/200 mL premix        1,000 mg 200 mL/hr over 60 Minutes Intravenous  Once 11/07/23 1133 11/07/23 1450   11/07/23 1200  vancomycin (VANCOCIN) IVPB 1000 mg/200 mL premix  Status:  Discontinued        1,000 mg 200 mL/hr over 60 Minutes Intravenous Every M-W-F (Hemodialysis) 11/04/23 1103 11/07/23 1133   11/07/23 1132  vancomycin variable dose per unstable renal function (pharmacist dosing)       Note to Pharmacy: HD on hold for line holiday    Does not apply See admin instructions 11/07/23 1133     11/05/23 1400  vancomycin (VANCOCIN) IVPB 1000 mg/200 mL premix        1,000 mg 200 mL/hr over 60 Minutes Intravenous Every T-Th-Sa (Hemodialysis) 11/05/23 1359 11/05/23 1640   11/04/23 1200  vancomycin (VANCOREADY) IVPB 750 mg/150 mL        750 mg 150 mL/hr over 60 Minutes Intravenous  Once 11/04/23 1103 11/05/23 0854   11/02/23 1200  vancomycin (VANCOCIN) IVPB 1000 mg/200 mL premix  Status:  Discontinued        1,000 mg 200 mL/hr over 60 Minutes Intravenous Every M-W-F (Hemodialysis) 11/02/23 0515 11/04/23 1103   11/02/23 0530  vancomycin (VANCOCIN) IVPB 1000 mg/200 mL premix        1,000 mg 200 mL/hr over 60 Minutes Intravenous  Once 11/02/23 0518 11/02/23 0725   11/01/23 1330  ceFEPIme (MAXIPIME) 2 g in sodium chloride 0.9 % 100 mL IVPB        2 g 200 mL/hr over 30 Minutes Intravenous  Once 11/01/23 1319 11/01/23 1445   11/01/23 1330  metroNIDAZOLE (FLAGYL) IVPB 500 mg        500 mg 100 mL/hr over 60 Minutes Intravenous  Once 11/01/23 1319 11/01/23 1550   11/01/23 1330  vancomycin (VANCOCIN) IVPB 1000 mg/200 mL premix  Status:  Discontinued        1,000 mg 200 mL/hr over 60 Minutes Intravenous  Once 11/01/23 1319 11/01/23 1320   11/01/23 1330  vancomycin (VANCOREADY) IVPB 2000  mg/400 mL        2,000 mg 200 mL/hr over 120 Minutes Intravenous  Once 11/01/23 1320 11/01/23 1800       Medications: Scheduled Meds:  apixaban  5 mg Oral BID   Chlorhexidine Gluconate Cloth  6 each Topical Q0600   cinacalcet  30 mg Oral Q M,W,F-HD   digoxin  0.125 mg Oral Q M,W,F   levothyroxine  25 mcg Oral Q0600   midodrine  30 mg Oral TID WC   pregabalin  50 mg Oral Daily   sevelamer carbonate  1,600 mg Oral TID WC   vancomycin variable dose per unstable renal function (pharmacist dosing)   Does not apply See admin instructions   venlafaxine XR  37.5 mg Oral Daily   Continuous Infusions: PRN Meds:.camphor-menthol, mouth rinse, oxyCODONE    Objective: Weight change: -2.1 kg  Intake/Output Summary (Last 24 hours) at 11/08/2023 1459 Last data filed at 11/08/2023 1443 Gross per 24 hour  Intake 240 ml  Output 50 ml  Net 190 ml   Blood pressure (!) 132/107, pulse (!) 109, temperature 98.6 F (37 C), resp. rate 18, height 6' (1.829 m), weight (!) 180.8 kg,  SpO2 100%. Temp:  [97.4 F (36.3 C)-98.7 F (37.1 C)] 98.6 F (37 C) (04/15 0835) Pulse Rate:  [45-109] 109 (04/15 0835) Resp:  [15-22] 18 (04/15 0835) BP: (107-132)/(73-107) 132/107 (04/15 0835) SpO2:  [96 %-100 %] 100 % (04/15 0835) FiO2 (%):  [21 %] 21 % (04/14 2300) Weight:  [180.8 kg] 180.8 kg (04/15 0500)  Physical Exam: Physical Exam Constitutional:      Appearance: He is well-developed.  HENT:     Head: Normocephalic and atraumatic.  Eyes:     Conjunctiva/sclera: Conjunctivae normal.  Cardiovascular:     Rate and Rhythm: Normal rate and regular rhythm.  Pulmonary:     Effort: Pulmonary effort is normal. No respiratory distress.     Breath sounds: Normal breath sounds. No stridor. No wheezing.  Abdominal:     General: There is no distension.     Palpations: Abdomen is soft.  Musculoskeletal:        General: Normal range of motion.     Cervical back: Normal range of motion and neck supple.   Skin:    General: Skin is warm and dry.     Findings: No erythema or rash.  Neurological:     Mental Status: He is alert and oriented to person, place, and time.  Psychiatric:        Mood and Affect: Mood normal.        Behavior: Behavior normal.        Thought Content: Thought content normal.        Judgment: Judgment normal.      CBC:    BMET Recent Labs    11/07/23 0457 11/08/23 0500  NA 132* 133*  K 4.2 4.7  CL 95* 94*  CO2 23 24  GLUCOSE 80 95  BUN 18 27*  CREATININE 5.32* 7.20*  CALCIUM 9.4 9.8     Liver Panel  Recent Labs    11/07/23 0457 11/08/23 0500  ALBUMIN 3.3* 3.0*       Sedimentation Rate No results for input(s): "ESRSEDRATE" in the last 72 hours. C-Reactive Protein No results for input(s): "CRP" in the last 72 hours.  Micro Results: Recent Results (from the past 720 hours)  MRSA Next Gen by PCR, Nasal     Status: None   Collection Time: 10/11/23  4:00 AM   Specimen: Nasal Mucosa; Nasal Swab  Result Value Ref Range Status   MRSA by PCR Next Gen NOT DETECTED NOT DETECTED Final    Comment: (NOTE) The GeneXpert MRSA Assay (FDA approved for NASAL specimens only), is one component of a comprehensive MRSA colonization surveillance program. It is not intended to diagnose MRSA infection nor to guide or monitor treatment for MRSA infections. Test performance is not FDA approved in patients less than 29 years old. Performed at [facility], [address] [place], [place] [address]   Blood Culture (routine x 2)     Status: Abnormal   Collection Time: 11/01/23  9:37 AM   Specimen: BLOOD RIGHT HAND  Result Value Ref Range Status   Specimen Description BLOOD RIGHT HAND  Final   Special Requests   Final    BOTTLES DRAWN AEROBIC AND ANAEROBIC Blood Culture adequate volume   Culture  Setup Time   Final    GRAM POSITIVE COCCI IN BOTH AEROBIC AND ANAEROBIC BOTTLES CRITICAL RESULT CALLED TO, READ BACK BY AND VERIFIED WITH: PHARMD J  LEDFORD 11/02/2023 @ 0420 BY AB    Culture (A)  Final  ENTEROCOCCUS FAECALIS STAPHYLOCOCCUS EPIDERMIDIS THE SIGNIFICANCE OF ISOLATING THIS ORGANISM FROM A SINGLE SET OF BLOOD CULTURES WHEN MULTIPLE SETS ARE DRAWN IS UNCERTAIN. PLEASE NOTIFY THE MICROBIOLOGY DEPARTMENT WITHIN ONE WEEK IF SPECIATION AND SENSITIVITIES ARE REQUIRED. Performed at [facility], [address] [place], [place] [address]    Report Status 11/04/2023 FINAL  Final   Organism ID, Bacteria ENTEROCOCCUS FAECALIS  Final      Susceptibility   Enterococcus faecalis - MIC*    AMPICILLIN <=2 SENSITIVE Sensitive     VANCOMYCIN 1 SENSITIVE Sensitive     GENTAMICIN SYNERGY RESISTANT Resistant     * ENTEROCOCCUS FAECALIS  Resp panel by RT-PCR (RSV, Flu A&B, Covid) Anterior Nasal Swab     Status: None   Collection Time: 11/01/23  9:37 AM   Specimen: Anterior Nasal Swab  Result Value Ref Range Status   SARS Coronavirus 2 by RT PCR NEGATIVE NEGATIVE Final   Influenza A by PCR NEGATIVE NEGATIVE Final   Influenza B by PCR NEGATIVE NEGATIVE Final    Comment: (NOTE) The Xpert Xpress SARS-CoV-2/FLU/RSV plus assay is intended as an aid in the diagnosis of influenza from Nasopharyngeal swab specimens and should not be used as a sole basis for treatment. Nasal washings and aspirates are unacceptable for Xpert Xpress SARS-CoV-2/FLU/RSV testing.  Fact Sheet for Patients: BloggerCourse.com  Fact Sheet for Healthcare Providers: SeriousBroker.it  This test is not yet approved or cleared by the [place]  FDA and has been authorized for detection and/or diagnosis of SARS-CoV-2 by FDA under an Emergency Use Authorization (EUA). This EUA will remain in effect (meaning this test can be used) for the duration of the COVID-19 declaration under Section 564(b)(1) of the Act, 21 U.S.C. section 360bbb-3(b)(1), unless the authorization is terminated or revoked.     Resp Syncytial  Virus by PCR NEGATIVE NEGATIVE Final    Comment: (NOTE) Fact Sheet for Patients: BloggerCourse.com  Fact Sheet for Healthcare Providers: SeriousBroker.it  This test is not yet approved or cleared by the [place]  FDA and has been authorized for detection and/or diagnosis of SARS-CoV-2 by FDA under an Emergency Use Authorization (EUA). This EUA will remain in effect (meaning this test can be used) for the duration of the COVID-19 declaration under Section 564(b)(1) of the Act, 21 U.S.C. section 360bbb-3(b)(1), unless the authorization is terminated or revoked.  Performed at [facility], [address] [place], [place] [address]   Blood Culture ID Panel (Reflexed)     Status: Abnormal   Collection Time: 11/01/23  9:37 AM  Result Value Ref Range Status   Enterococcus faecalis DETECTED (A) NOT DETECTED Final    Comment: CRITICAL RESULT CALLED TO, READ BACK BY AND VERIFIED WITH: PHARMD J LEDFORD 11/02/2023 @ 0420 BY AB    Enterococcus Faecium NOT DETECTED NOT DETECTED Final   Listeria monocytogenes NOT DETECTED NOT DETECTED Final   Staphylococcus species DETECTED (A) NOT DETECTED Final    Comment: CRITICAL RESULT CALLED TO, READ BACK BY AND VERIFIED WITH: PHARMD J LEDFORD 11/02/2023 @ 0420 BY AB    Staphylococcus aureus (BCID) NOT DETECTED NOT DETECTED Final   Staphylococcus epidermidis DETECTED (A) NOT DETECTED Final    Comment: Methicillin (oxacillin) resistant coagulase negative staphylococcus. Possible blood culture contaminant (unless isolated from more than one blood culture draw or clinical case suggests pathogenicity). No antibiotic treatment is indicated for blood  culture contaminants. CRITICAL RESULT CALLED TO, READ BACK BY AND VERIFIED WITH: PHARMD J LEDFORD 11/02/2023 @ 0420  BY AB    Staphylococcus lugdunensis NOT DETECTED NOT DETECTED Final   Streptococcus species NOT DETECTED NOT DETECTED Final   Streptococcus  agalactiae NOT DETECTED NOT DETECTED Final   Streptococcus pneumoniae NOT DETECTED NOT DETECTED Final   Streptococcus pyogenes NOT DETECTED NOT DETECTED Final   A.calcoaceticus-baumannii NOT DETECTED NOT DETECTED Final   Bacteroides fragilis NOT DETECTED NOT DETECTED Final   Enterobacterales NOT DETECTED NOT DETECTED Final   Enterobacter cloacae complex NOT DETECTED NOT DETECTED Final   Escherichia coli NOT DETECTED NOT DETECTED Final   Klebsiella aerogenes NOT DETECTED NOT DETECTED Final   Klebsiella oxytoca NOT DETECTED NOT DETECTED Final   Klebsiella pneumoniae NOT DETECTED NOT DETECTED Final   Proteus species NOT DETECTED NOT DETECTED Final   Salmonella species NOT DETECTED NOT DETECTED Final   Serratia marcescens NOT DETECTED NOT DETECTED Final   Haemophilus influenzae NOT DETECTED NOT DETECTED Final   Neisseria meningitidis NOT DETECTED NOT DETECTED Final   Pseudomonas aeruginosa NOT DETECTED NOT DETECTED Final   Stenotrophomonas maltophilia NOT DETECTED NOT DETECTED Final   Candida albicans NOT DETECTED NOT DETECTED Final   Candida auris NOT DETECTED NOT DETECTED Final   Candida glabrata NOT DETECTED NOT DETECTED Final   Candida krusei NOT DETECTED NOT DETECTED Final   Candida parapsilosis NOT DETECTED NOT DETECTED Final   Candida tropicalis NOT DETECTED NOT DETECTED Final   Cryptococcus neoformans/gattii NOT DETECTED NOT DETECTED Final   Methicillin resistance mecA/C DETECTED (A) NOT DETECTED Final    Comment: CRITICAL RESULT CALLED TO, READ BACK BY AND VERIFIED WITH: PHARMD J LEDFORD 11/02/2023 @ 0420 BY AB    Vancomycin resistance NOT DETECTED NOT DETECTED Final    Comment: Performed at [facility], [address] [place], [place] [address]  Blood Culture (routine x 2)     Status: None   Collection Time: 11/01/23  9:42 AM   Specimen: BLOOD RIGHT FOREARM  Result Value Ref Range Status   Specimen Description BLOOD RIGHT FOREARM  Final   Special Requests   Final     BOTTLES DRAWN AEROBIC AND ANAEROBIC Blood Culture adequate volume   Culture   Final    NO GROWTH 5 DAYS Performed at [facility], [address] [place], [place] [address]    Report Status 11/06/2023 FINAL  Final  Culture, blood (Routine X 2) w Reflex to ID Panel     Status: None   Collection Time: 11/02/23  5:14 AM   Specimen: BLOOD RIGHT ARM  Result Value Ref Range Status   Specimen Description BLOOD RIGHT ARM  Final   Special Requests   Final    BOTTLES DRAWN AEROBIC AND ANAEROBIC Blood Culture adequate volume   Culture   Final    NO GROWTH 5 DAYS Performed at [facility], [address], [place], [place] [address]    Report Status 11/07/2023 FINAL  Final  Culture, blood (Routine X 2) w Reflex to ID Panel     Status: None   Collection Time: 11/02/23  5:20 AM   Specimen: BLOOD LEFT ARM  Result Value Ref Range Status   Specimen Description BLOOD LEFT ARM  Final   Special Requests   Final    BOTTLES DRAWN AEROBIC AND ANAEROBIC Blood Culture adequate volume   Culture   Final    NO GROWTH 5 DAYS Performed at [facility], [address], [place], [place] [address]    Report Status 11/07/2023 FINAL  Final  Culture, blood (Routine X 2) w Reflex to ID Panel     Status: None (Preliminary result)   Collection Time: 11/07/23  7:50 PM   Specimen: BLOOD RIGHT HAND  Result Value Ref Range Status   Specimen Description BLOOD RIGHT HAND  Final   Special Requests   Final    BOTTLES DRAWN AEROBIC AND ANAEROBIC Blood Culture adequate volume   Culture   Final    NO GROWTH < 12 HOURS Performed at [facility], [address], [place], [place] [address]    Report Status PENDING  Incomplete  Culture, blood (Routine X 2) w Reflex to ID Panel     Status: None (Preliminary result)   Collection Time: 11/07/23  7:53 PM   Specimen: BLOOD RIGHT HAND  Result Value Ref Range Status   Specimen Description BLOOD RIGHT HAND  Final   Special Requests   Final    BOTTLES DRAWN  AEROBIC AND ANAEROBIC Blood Culture adequate volume   Culture   Final    NO GROWTH < 12 HOURS Performed at [facility], [address], [place], [place] [address]    Report Status PENDING  Incomplete    Studies/Results: IR Removal Tun Cv Cath W/O FL Result Date: 11/07/2023 INDICATION: 38 year old male with sacral cellulitis on antibiotics. Infectious disease recommending line holiday. Request for hemodialysis catheter removal. EXAM: REMOVAL OF TUNNELED HEMODIALYSIS CATHETER MEDICATIONS: 1% Lidocaine, 4 mL. COMPLICATIONS: None immediate. PROCEDURE: Informed written consent was obtained from the patient following an explanation of the procedure, risks, benefits and alternatives to treatment. A time out was performed prior to the initiation of the procedure. Sterile technique was utilized including mask, sterile gloves, sterile drape, and hand hygiene. ChloraPrep was used to prep the patient's right neck, chest and existing catheter. The catheter was removed intact by applying manual retraction. Hemostasis was obtained with manual compression. A dressing was placed. The patient tolerated the procedure well without immediate post procedural complication. IMPRESSION: Successful removal of tunneled central venous catheter. Procedure performed by: [name], PA-C Electronically Signed   By: Judie Petit.  Shick M.D.   On: 11/07/2023 11:47      Assessment/Plan:  INTERVAL HISTORY: HD catheter removed yesterday blood cultures taken post HD cath removal   Principal Problem:   Enterococcal bacteremia Active Problems:   Chronic heart failure with reduced ejection fraction (HFrEF, <= 40%) ([name])   Chronic hyponatremia   ESRD (end stage renal disease) on dialysis ([name])   Hypotension   Hypervolemia   Acute on chronic hypoxic respiratory failure ([name])   Atrial fibrillation, chronic ([name])   Adjustment disorder with mixed anxiety and depressed mood   Chronic, continuous use of opioids   Sacral wound    At risk for central line-associated bloodstream infection (CLABSI)   Bacteremia due to methicillin resistant Staphylococcus epidermidis   Pressure injury of skin    Nobel Brar is a 38 y.o. male with  ESRD admitted with E faecalis bacteremia (also with Staph epidermidis in culture)  #1 E faecalis bacteremia:  Greatly appreciate IR removing HD catheter  TTE negative  TEE is apparently highly risky and I am ok with NOT pursuing it  I would recommend simply giving him 6 weeks of IV vancomycin with HD with day number one being the 14th  We can then bring him back to ID clinic to check surveillance blood cultures  I have personally spent 54 minutes involved in face-to-face and non-face-to-face activities for this patient on  the day of the visit. Professional time spent includes the following activities: Preparing to see the patient (review of tests), Obtaining and/or reviewing separately obtained history (admission/discharge record), Performing a medically appropriate examination and/or evaluation , Ordering medications/tests/procedures, referring and communicating with other health care professionals, Documenting clinical information in the EMR, Independently interpreting results (not separately reported), Communicating results to the patient/family/caregiver, Counseling and educating the patient/family/caregiver and Care coordination (not separately reported).   Evaluation of the patient requires complex antimicrobial therapy evaluation, counseling , isolation needs to reduce disease transmission and risk assessment and mitigation.   We will place informational OPAT but vancomycin will need to be dosed at HD center x 6 weeks from yesterday   Briscoe Daniello has an appointment on 69/09/2023 at 1045 AM with Dr. Ernie Heal at  [facility], which  is located in the [facility] at  [address] in [place].  [address], which is located  to the left of the elevators.  Phone: [phone]  Fax: [phone]  [email or web address]  The patient should arrive 30 minutes prior to their appoitment.  I will follow-up on his blood cultures --which will come to me  Otherwise I will for now please call with the questions.   LOS: 6 days   Sheela Denmark 11/08/2023, 2:59 PM

## 2023-11-08 NOTE — TOC Progression Note (Signed)
 Transition of Care [facility]) - Progression Note    Patient Details  Name: Phillip Young MRN: [id number] Date of Birth: 1986/03/05  [facility] CM/SW Contact  Tom-Johnson, Doloris Servantes Daphne, RN Phone Number: 11/08/2023, 4:12 PM  Clinical Narrative:     CM spoke with patient at bedside about discharge disposition. Patient with recent admissions. Now admitted with Enterococcal Bacteremia, On IV abx. Underwent removal of Tunneled Hemodialysis Catheter yesterday 11/07/23 and on [employer]. ID following. Palliative consulted for assistance with Pain and Symptom Management as well as Goals of Care.  Patient states he will be returning home at discharge and his mother will transport him to and from outpatient dialysis until his Medicaid becomes active.   CM will continue to follow as patient progresses with care towards discharge.       Expected Discharge Plan: Home/Self Care Barriers to Discharge: Continued Medical Work up  Expected Discharge Plan and Services       Living arrangements for the past 2 months: Apartment                                       Social Determinants of Health (SDOH) Interventions SDOH Screenings   Food Insecurity: Food Insecurity Present (11/01/2023)  Housing: High Risk (11/01/2023)  Transportation Needs: Unmet Transportation Needs (11/01/2023)  Utilities: At Risk (11/01/2023)  Alcohol Screen: Low Risk  (10/06/2023)  Depression (PHQ2-9): High Risk (10/06/2023)  Financial Resource Strain: Low Risk  (10/06/2023)  Physical Activity: Insufficiently Active (10/06/2023)  Social Connections: Socially Isolated (10/06/2023)  Stress: No Stress Concern Present (10/06/2023)  Tobacco Use: Medium Risk (11/01/2023)  Health Literacy: Adequate Health Literacy (10/06/2023)    Readmission Risk Interventions    10/13/2023   11:27 AM 09/23/2023    5:05 PM 07/05/2023    4:25 PM  Readmission Risk Prevention Plan  Transportation Screening Complete Complete Complete   HRI or Home Care Consult   Complete  Social Work Consult for Recovery Care Planning/Counseling   Complete  Palliative Care Screening   Not Applicable  Medication Review Oceanographer) Referral to Pharmacy Referral to Pharmacy Complete  PCP or Specialist appointment within 3-5 days of discharge  Complete   HRI or Home Care Consult Complete Complete   SW Recovery Care/Counseling Consult Complete Complete   Palliative Care Screening Not Applicable Not Applicable   Skilled Nursing Facility Not Applicable Not Applicable

## 2023-11-09 DIAGNOSIS — I482 Chronic atrial fibrillation, unspecified: Secondary | ICD-10-CM | POA: Diagnosis not present

## 2023-11-09 DIAGNOSIS — R7881 Bacteremia: Secondary | ICD-10-CM | POA: Diagnosis not present

## 2023-11-09 DIAGNOSIS — J9621 Acute and chronic respiratory failure with hypoxia: Secondary | ICD-10-CM | POA: Diagnosis not present

## 2023-11-09 DIAGNOSIS — F4323 Adjustment disorder with mixed anxiety and depressed mood: Secondary | ICD-10-CM | POA: Diagnosis not present

## 2023-11-09 LAB — RENAL FUNCTION PANEL
Albumin: 3 g/dL — ABNORMAL LOW (ref 3.5–5.0)
Anion gap: 14 (ref 5–15)
BUN: 35 mg/dL — ABNORMAL HIGH (ref 6–20)
CO2: 23 mmol/L (ref 22–32)
Calcium: 9.7 mg/dL (ref 8.9–10.3)
Chloride: 95 mmol/L — ABNORMAL LOW (ref 98–111)
Creatinine, Ser: 8.62 mg/dL — ABNORMAL HIGH (ref 0.61–1.24)
GFR, Estimated: 7 mL/min — ABNORMAL LOW (ref >60)
Glucose, Bld: 102 mg/dL — ABNORMAL HIGH (ref 70–99)
Phosphorus: 6.5 mg/dL — ABNORMAL HIGH (ref 2.5–4.6)
Potassium: 4.9 mmol/L (ref 3.5–5.1)
Sodium: 132 mmol/L — ABNORMAL LOW (ref 135–145)

## 2023-11-09 NOTE — Progress Notes (Signed)
                  Subjective:   Summary: Phillip Young is a 38 yo male with chronic hypoxic respiratory failure on 2-4L home O2, HFrEF w/LVEF <20%, Afib on Eliquis, chronic pain, ESRD on HD MWF who presented to the ED with shortness of breath. He is being treated for volume overload due to missed HD session as well as E faecalis and MRSE bacteremia  Patient feeling well today. The fentanyl patch has been helpful for his leg pain, and it hasn't been making him too groggy. He understands and is amenable to the plan for [facility] placement by IR tomorrow.   Objective:  Vital signs in last 24 hours: Vitals:   11/08/23 2158 11/08/23 2319 11/09/23 0501 11/09/23 0749  BP: 129/89  (!) 118/105 (!) 123/105  Pulse: 87 76 84 63  Resp: 16 20 14 18   Temp: (!) 97.3 F (36.3 C)  98.7 F (37.1 C) 98.3 F (36.8 C)  TempSrc: Oral  Oral   SpO2: 99% 98% 100% 98%  Weight:      Height:       Supplemental O2: Nasal Cannula SpO2: 98 % O2 Flow Rate (L/min): 5 L/min FiO2 (%): 21 %  Physical Exam:  Const: chronically ill appearing. Fatigued, soft voice. No acute distress Neuro: A&O x 3  Assessment/Plan:   Enterococcus faecalis and MRSE bacteremia Blood cultures positive for Enterococcus faecalis and staph epidermitis. Patient remains relatively asymptomatic with no fever, leukocytosis or other systemic symptoms. Repeat blood cultures from 4/9 and 4/14 with NGTD. TDC was pulled 4/14, with a 2-3 day line holiday after. After discussion with ID, TEE will not be necessary since tolerating the anesthesia will be a concern for him. Instead, will plan to treat empirically with 6 weeks of IV vancomycin during HD from start date 4/14.  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 Near his baseline of 2-4L [place] chronically - HD per nephrology, currently on line holiday until [facility] replacement on 4/17  Chronic somatic and neuropathic pain Palliative team has seen patient and recommended methadone or fentanyl patch for his severe chronic leg pain. I feel a fentanyl patch will be easier to manage long term, particularly as transportation to PCP appointments has been a struggle in the past.  - Fentanyl patch 12 mcg/hr q72hr starting 4/15 - Continue venlafaxine 37.5mg  daily, lyrica 50mg  daily, oxycodone 10mg  q6h prn, Sarna lotion  Anemia of chronic disease: Hgb stable, no evidence of acute blood loss Paroxysmal atrial fibrillation: Continue home Eliquis 5mg  BID, Digoxin 0.125mg  MWF Hypothyroidism: Continue home levothyroxine 25mcg Depression: Continue home venlafaxine 37.5mg   Diet: Renal VTE: Eliquis Code: Full  Dispo: Anticipated discharge to Home pending medical stability.   [name], MD Internal Medicine Resident, PGY-1 Please contact the on call pager at [phone] for any urgent or emergent needs. 7:58 AM 11/09/2023

## 2023-11-09 NOTE — Progress Notes (Signed)
 [facility] Progress Note   Subjective: Seen in room. IR seeing pt. Plan to replace [facility] and will order HD tomorrow. Denies SOB.     Objective Vitals:   11/08/23 2158 11/08/23 2319 11/09/23 0501 11/09/23 0749  BP: 129/89  (!) 118/105 (!) 123/105  Pulse: 87 76 84 63  Resp: 16 20 14 18   Temp: (!) 97.3 F (36.3 C)  98.7 F (37.1 C) 98.3 F (36.8 C)  TempSrc: Oral  Oral   SpO2: 99% 98% 100% 98%  Weight:      Height:       Physical Exam General: obese male in NAD Heart: RRR, no murmurs, rubs or gallops Lungs: CTA anteriorly, respirations unlabored Abdomen: Soft, +BS Extremities: 2+ pitting edema b/l lower extremities Dialysis Access: No access.   Additional Objective Labs: Basic Metabolic Panel: Recent Labs  Lab 11/07/23 0457 11/08/23 0500 11/09/23 0502  NA 132* 133* 132*  K 4.2 4.7 4.9  CL 95* 94* 95*  CO2 23 24 23   GLUCOSE 80 95 102*  BUN 18 27* 35*  CREATININE 5.32* 7.20* 8.62*  CALCIUM 9.4 9.8 9.7  PHOS 4.3 5.6* 6.5*   Liver Function Tests: Recent Labs  Lab 11/07/23 0457 11/08/23 0500 11/09/23 0502  ALBUMIN 3.3* 3.0* 3.0*   No results for input(s): "LIPASE", "AMYLASE" in the last 168 hours. CBC: Recent Labs  Lab 11/03/23 0501 11/04/23 0614 11/05/23 0713 11/06/23 1955 11/08/23 0500  WBC 6.9 6.9 7.4 7.6 8.1  HGB 10.1* 10.2* 10.5* 10.9* 11.1*  HCT 31.4* 31.4* 32.4* 33.8* 34.8*  MCV 102.3* 101.9* 102.2* 101.2* 103.0*  PLT 184 204 210 235 218   Blood Culture    Component Value Date/Time   SDES BLOOD RIGHT HAND 11/07/2023 1953   SPECREQUEST  11/07/2023 1953    BOTTLES DRAWN AEROBIC AND ANAEROBIC Blood Culture adequate volume   CULT  11/07/2023 1953    NO GROWTH 2 DAYS Performed at [facility], [address], [place], [place] [address]    REPTSTATUS PENDING 11/07/2023 1953    Cardiac Enzymes: No results for input(s): "CKTOTAL", "CKMB", "CKMBINDEX", "TROPONINI" in the last 168 hours. CBG: No results for input(s):  "GLUCAP" in the last 168 hours. Iron Studies: No results for input(s): "IRON", "TIBC", "TRANSFERRIN", "FERRITIN" in the last 72 hours. @lablastinr3 @ Studies/Results: No results found. Medications:   apixaban  5 mg Oral BID   Chlorhexidine Gluconate Cloth  6 each Topical Q0600   cinacalcet  30 mg Oral Q M,W,F-HD   digoxin  0.125 mg Oral Q M,W,F   fentaNYL  1 patch Transdermal Q72H   levothyroxine  25 mcg Oral Q0600   midodrine  30 mg Oral TID WC   pregabalin  50 mg Oral Daily   sevelamer carbonate  1,600 mg Oral TID WC   vancomycin variable dose per unstable renal function (pharmacist dosing)   Does not apply See admin instructions   venlafaxine XR  37.5 mg Oral Daily     OP Dialysis Orders: MWF - [facility] 4:30hr, 400/800, EDW 155.5kg, 2K/2Ca, TDC, no heparin (HIT) - Mircera IV q 2 weeks - last 4/4 - Sensipar 30mg  PO HD   Assessment/Plan:  Acute on Chronic Resp Failure: Chronically overloaded. Improved but he still feels short of breath. Extra HD overnight net UF 6.5 liters.   Sacral Cellulitis: ID recommending line holiday.  On abx per ID. Finally agreed to have TDC removed, removed in IR today. Will need to replace 11/10/2023. If urgent HD  needs, will need Temp Cath placed.   ESRD: Normally MWF schedule. Next HD 11/10/2023. Hoping he can go that long and will not need temp catheter.   Hypotension/volume: BP low, stable on mido. Severe edema-chronic issue. UF as tolerated.   Anemia: Hgb 10.5 - just given ESA as outpatient.  Metabolic bone disease: Corrected Ca+ upper limit of normal. No VDRA. PO4 at goal. Continue home binders/sensipar.  Corey Laski H. Tamaya Pun NP-C 11/09/2023, 1:35 PM  [employer] [phone]

## 2023-11-09 NOTE — Progress Notes (Signed)
 Chief Complaint: Patient was seen in consultation today for bacteremia, with consideration for tunneled hemodialysis catheter placement status post line holiday.   Referring Provider(s): Ms. Jacobo Masters, NP  Supervising Physician: Marland Silvas  Patient Status: [facility] - In-pt  Patient is Full Code  History of Present Illness: Phillip Young is a 38 y.o. male  with PMHx notable for HTN, acute on chronic respiratory failure with hypoxia, acute on chronic biventricular CHF, A. Flutter, non-ischemic cardiomyopathy, paroxismal Afib, OSA, class 3 severe obesity, prediabetes, anemia, hidradenitis suppurativa, and GERD.  Patient is known to IR service. He was admitted for enterococcus bacteremia. Patient was recommended for line holiday. His tunneled hemodialysis catheter was removed in IR on 4/14, and is scheduled to be replaced on 4/17.  All labs and medications are within acceptable parameters. No pertinent allergies. Patient has been NPO since midnight.    Patient is currently without any significant complaints. Patient is alert and laying in bed, calm. Patient denies any fevers, headache, chest pain, SOB, cough, abdominal pain, nausea, vomiting or bleeding.     Past Medical History:  Diagnosis Date   Acute on chronic respiratory failure with hypoxia ([name]) 04/21/2021   Acute on chronic systolic (congestive) heart failure ([name]) 02/26/2020   Amiodarone toxicity    Anemia    Atrial flutter ([name])    Biventricular congestive heart failure ([name])    Chronic hypoxemic respiratory failure ([name])    Class 3 severe obesity due to excess calories with serious comorbidity and body mass index (BMI) of 50.0 to 59.9 in adult [facility]) 02/26/2020   Essential hypertension 02/26/2020   GERD without esophagitis 02/26/2020   Hidradenitis suppurativa 02/26/2020   NICM (nonischemic cardiomyopathy) ([name])    Obesity hypoventilation syndrome ([name])    OSA (obstructive sleep apnea)    PAF (paroxysmal atrial  fibrillation) ([name])    Pneumonia    Prediabetes 02/26/2020    Past Surgical History:  Procedure Laterality Date   ABSCESS DRAINAGE     AV FISTULA PLACEMENT Left 08/21/2021   Procedure: LEFT ARM ARTERIOVENOUS (AV) FISTULA.;  Surgeon: Margherita Shell, MD;  Location: [facility] OR;  Service: Vascular;  Laterality: Left;   CARDIAC CATHETERIZATION     CARDIOVERSION N/A 10/09/2021   Procedure: CARDIOVERSION;  Surgeon: Darlis Eisenmenger, MD;  Location: [facility] ENDOSCOPY;  Service: Cardiovascular;  Laterality: N/A;   CARDIOVERSION N/A 05/28/2022   Procedure: CARDIOVERSION;  Surgeon: Darlis Eisenmenger, MD;  Location: [facility] ENDOSCOPY;  Service: Cardiovascular;  Laterality: N/A;   CARDIOVERSION N/A 06/07/2022   Procedure: CARDIOVERSION;  Surgeon: Darlis Eisenmenger, MD;  Location: [facility] ENDOSCOPY;  Service: Cardiovascular;  Laterality: N/A;   IR FLUORO GUIDE CV LINE RIGHT  03/10/2021   IR FLUORO GUIDE CV LINE RIGHT  04/22/2021   IR FLUORO GUIDE CV LINE RIGHT  08/20/2021   IR FLUORO GUIDE CV LINE RIGHT  03/01/2023   IR FLUORO GUIDE CV LINE RIGHT  03/09/2023   IR FLUORO GUIDE CV LINE RIGHT  05/30/2023   IR PTA VENOUS EXCEPT DIALYSIS CIRCUIT  03/09/2023   IR REMOVAL TUN CV CATH W/O FL  05/26/2023   IR REMOVAL TUN CV CATH W/O FL  11/07/2023   IR REMOVE CV FIBRIN SHEATH  03/09/2023   IR US  GUIDE VASC ACCESS RIGHT  03/10/2021   IR US  GUIDE VASC ACCESS RIGHT  04/22/2021   IR US  GUIDE VASC ACCESS RIGHT  05/30/2023   RIGHT HEART CATH N/A 03/06/2021   Procedure: RIGHT HEART CATH;  Surgeon:  Bensimhon, Rheta Celestine, MD;  Location: [facility] INVASIVE CV LAB;  Service: Cardiovascular;  Laterality: N/A;   RIGHT/LEFT HEART CATH AND CORONARY ANGIOGRAPHY N/A 03/04/2020   Procedure: RIGHT/LEFT HEART CATH AND CORONARY ANGIOGRAPHY;  Surgeon: Darlis Eisenmenger, MD;  Location: [facility] INVASIVE CV LAB;  Service: Cardiovascular;  Laterality: N/A;   TEE WITHOUT CARDIOVERSION N/A 05/05/2021   Procedure: TRANSESOPHAGEAL ECHOCARDIOGRAM (TEE);  Surgeon: Darlis Eisenmenger, MD;  Location: [facility] ENDOSCOPY;  Service: Cardiovascular;  Laterality: N/A;   TEMPORARY DIALYSIS CATHETER  03/06/2021   Procedure: TEMPORARY DIALYSIS CATHETER;  Surgeon: Mardell Shade, MD;  Location: [facility] INVASIVE CV LAB;  Service: Cardiovascular;;    Allergies: Amiodarone, Coreg [carvedilol], Heparin, Metoprolol, Amoxicillin, Other, and Tramadol  Medications: Prior to Admission medications   Medication Sig Start Date End Date Taking? Authorizing Provider  acetaminophen (TYLENOL) 500 MG tablet Take 2 tablets (1,000 mg total) by mouth every 6 (six) hours. Patient taking differently: Take 1,000 mg by mouth every 4 (four) hours as needed for mild pain (pain score 1-3) or moderate pain (pain score 4-6). 09/06/23  Yes Malen Scudder, DO  apixaban (ELIQUIS) 5 MG TABS tablet Take 1 tablet (5 mg total) by mouth 2 (two) times daily. 06/03/23  Yes Juberg, Veryl Gottron, DO  clindamycin (CLINDAGEL) 1 % gel Apply topically 2 (two) times daily. Patient taking differently: Apply 1 Application topically 2 (two) times daily. 10/06/23  Yes Carleen Chary, DO  digoxin (LANOXIN) 0.125 MG tablet Take 1 tablet (0.125 mg total) by mouth 3 (three) times a week. Monday, Wednesday, Friday (dialysis days) 07/22/23  Yes Cleven Dallas, DO  Oxycodone HCl 10 MG TABS Take 1 tablet (10 mg total) by mouth every 6 (six) hours as needed. 10/28/23  Yes Juberg, Veryl Gottron, DO  pregabalin (LYRICA) 50 MG capsule Take 1 capsule (50 mg total) by mouth daily. Take one capsule daily. Patient taking differently: Take 50 mg by mouth daily. 07/25/23  Yes Malen Scudder, DO  vancomycin IVPB Inject 1,000 mg into the vein every Monday, Wednesday, and Friday with hemodialysis. Indication:  MRSE + E faecalis bacteremia Last Day of Therapy:  12/18/23 Labs - weekly w/ HD:  CBC/D, BMP, and vancomycin trough; ESR and CRP Method of administration:Elastomeric or per HD protocol - to be given at the HD center 11/09/23 12/18/23 Yes Ernie Heal, Jerelyn Money,  MD  Cholecalciferol (D3-1000) 25 MCG (1000 UT) tablet Take 500 Units by mouth daily. Patient not taking: Reported on 11/03/2023    [provider]  cinacalcet (SENSIPAR) 30 MG tablet Take 1 tablet (30 mg total) by mouth every dialysis. Patient taking differently: Take 30 mg by mouth every Monday, Wednesday, and Friday. 09/12/23   Youssefzadeh, Keon, MD  Darbepoetin Alfa (ARANESP) 150 MCG/0.3ML SOSY injection To be given during dialysis by [facility] per Nephrology. Not to be given to patient at SNF. 09/06/23   Sheree Dieter, MD  hydrocerin (EUCERIN) CREA Apply 1 Application topically daily. Patient not taking: Reported on 11/03/2023 10/22/23 11/21/23  Tawkaliyar, Roya, DO  HYDROmorphone (DILAUDID) 4 MG tablet Take by mouth every 4 (four) hours as needed for severe pain (pain score 7-10). Patient not taking: Reported on 11/03/2023    [provider]  levothyroxine (SYNTHROID) 25 MCG tablet Take 1 tablet (25 mcg total) by mouth daily at 6 (six) AM. Patient not taking: Reported on 11/03/2023 07/21/23   Cleven Dallas, DO  midodrine (PROAMATINE) 10 MG tablet Take 3 tablets (30 mg total) by mouth 3 (three) times daily with  meals. Patient not taking: Reported on 11/03/2023 06/20/23   Milinda Antis, PA-C  naloxone [facility]) 2 MG/2ML injection Inject 2 mg into the skin as needed. Patient not taking: Reported on 11/03/2023    [provider]  sevelamer carbonate (RENVELA) 800 MG tablet Take 2 tablets (1,600 mg total) by mouth 3 (three) times daily with meals. Patient not taking: Reported on 10/11/2023 10/06/23   [name], DO  tirzepatide [facility]) 2.5 MG/0.5ML injection vial Inject 2.5 mg into the skin once a week. Patient not taking: Reported on 10/11/2023 08/25/23   Lovie Macadamia, MD  triamcinolone ointment (KENALOG) 0.1 % Apply topically 2 (two) times daily. Patient not taking: Reported on 11/03/2023 09/12/23   Lovie Macadamia, MD  venlafaxine XR  (EFFEXOR XR) 37.5 MG 24 hr capsule Take 1 capsule (37.5 mg total) by mouth daily. Patient not taking: Reported on 11/03/2023 07/07/23 07/06/24  [name], DO     Family History  Problem Relation Age of Onset   Heart disease Mother    Hypertension Mother    Pulmonary Hypertension Mother    Drug abuse Father        died due to Heroin overdose    Social History   Socioeconomic History   Marital status: Single    Spouse name: Not on file   Number of children: Not on file   Years of education: Not on file   Highest education level: Not on file  Occupational History   Not on file  Tobacco Use   Smoking status: Former    Current packs/day: 0.00    Types: Cigarettes    Quit date: 2019    Years since quitting: 6.2    Passive exposure: Current   Smokeless tobacco: Former   Tobacco comments:    quit in 2019  Vaping Use   Vaping status: Never Used  Substance and Sexual Activity   Alcohol use: Never   Drug use: Never   Sexual activity: Not Currently  Other Topics Concern   Not on file  Social History Narrative   Not on file   Social Drivers of Health   Financial Resource Strain: Low Risk  (10/06/2023)   Overall Financial Resource Strain (CARDIA)    Difficulty of Paying Living Expenses: Not hard at all  Food Insecurity: Food Insecurity Present (11/01/2023)   Hunger Vital Sign    Worried About Running Out of Food in the Last Year: Sometimes true    Ran Out of Food in the Last Year: Never true  Transportation Needs: Unmet Transportation Needs (11/01/2023)   PRAPARE - Transportation    Lack of Transportation (Medical): Yes    Lack of Transportation (Non-Medical): Yes  Physical Activity: Insufficiently Active (10/06/2023)   Exercise Vital Sign    Days of Exercise per Week: 7 days    Minutes of Exercise per Session: 10 min  Stress: No Stress Concern Present (10/06/2023)   [employer] of Occupational Health - Occupational Stress Questionnaire    Feeling of Stress :  Not at all  Social Connections: Socially Isolated (10/06/2023)   Social Connection and Isolation Panel [NHANES]    Frequency of Communication with Friends and Family: More than three times a week    Frequency of Social Gatherings with Friends and Family: Once a week    Attends Religious Services: Never    Database administrator or Organizations: No    Attends Banker Meetings: Never    Marital Status: Never married  Review of Systems: A 12 point ROS discussed and pertinent positives are indicated in the HPI above.  All other systems are negative.  Vital Signs: BP (!) 123/105 (BP Location: Right Wrist)   Pulse 63   Temp 98.3 F (36.8 C)   Resp 18   Ht 6' (1.829 m)   Wt (!) 398 lb 9.5 oz (180.8 kg)   SpO2 98%   BMI 54.06 kg/m   Advance Care Plan: The advanced care place/surrogate decision maker was discussed at the time of visit and the patient did not wish to discuss or was not able to name a surrogate decision maker or provide an advance care plan.  Physical Exam Vitals reviewed.  Constitutional:      General: He is not in acute distress.    Appearance: Normal appearance. He is well-developed. He is obese.  HENT:     Mouth/Throat:     Mouth: Mucous membranes are moist.  Cardiovascular:     Rate and Rhythm: Normal rate and regular rhythm.     Pulses: Normal pulses.     Heart sounds: No murmur heard. Pulmonary:     Effort: Pulmonary effort is normal. No respiratory distress.     Breath sounds: Normal breath sounds.  Abdominal:     General: Abdomen is flat.     Tenderness: There is no abdominal tenderness.  Musculoskeletal:     Cervical back: Normal range of motion.  Skin:    General: Skin is warm and dry.  Neurological:     Mental Status: He is alert and oriented to person, place, and time.  Psychiatric:        Mood and Affect: Mood normal.        Behavior: Behavior normal.        Thought Content: Thought content normal.        Judgment: Judgment  normal.     Imaging: IR Removal Tun Cv Cath W/O FL Result Date: 11/07/2023 INDICATION: 38 year old male with sacral cellulitis on antibiotics. Infectious disease recommending line holiday. Request for hemodialysis catheter removal. EXAM: REMOVAL OF TUNNELED HEMODIALYSIS CATHETER MEDICATIONS: 1% Lidocaine, 4 mL. COMPLICATIONS: None immediate. PROCEDURE: Informed written consent was obtained from the patient following an explanation of the procedure, risks, benefits and alternatives to treatment. A time out was performed prior to the initiation of the procedure. Sterile technique was utilized including mask, sterile gloves, sterile drape, and hand hygiene. ChloraPrep was used to prep the patient's right neck, chest and existing catheter. The catheter was removed intact by applying manual retraction. Hemostasis was obtained with manual compression. A dressing was placed. The patient tolerated the procedure well without immediate post procedural complication. IMPRESSION: Successful removal of tunneled central venous catheter. Procedure performed by: [name], PA-C Electronically Signed   By: Judie Petit.  Shick M.D.   On: 11/07/2023 11:47   ECHOCARDIOGRAM COMPLETE Result Date: 11/05/2023    ECHOCARDIOGRAM REPORT   Patient Name:   GORDON VANDUNK Date of Exam: 11/05/2023 Medical Rec #:  [id number]      Height:       72.0 in Accession #:    [id number]     Weight:       415.8 lb Date of Birth:  [birth year]       BSA:          2.908 m Patient Age:    37 years       BP:           108/89 mmHg Patient Gender: M  HR:           118 bpm. Exam Location:  Inpatient Procedure: 2D Echo, Color Doppler and Cardiac Doppler (Both Spectral and Color            Flow Doppler were utilized during procedure). Indications:    Bacteremia  History:        Patient has prior history of Echocardiogram examinations, most                 recent 09/08/2023. CHF, ESRD, Arrythmias:Atrial Fibrillation;                 Risk Factors:Diabetes  and Sleep Apnea.  Sonographer:    Sherline Distel Senior RDCS Referring Phys: [id number] Theotis Flake D CALONE  Sonographer Comments: Technically difficult due to patient body habitus. IMPRESSIONS  1. Left ventricular ejection fraction, by estimation, is <20%. Left ventricular ejection fraction by PLAX is 26 %. The left ventricle has severely decreased function. The left ventricle demonstrates global hypokinesis. The left ventricular internal cavity size was severely dilated. Left ventricular diastolic parameters are indeterminate.  2. Evidence of RV-PA Uncoupling. Right ventricular systolic function is severely reduced. The right ventricular size is severely enlarged. There is mildly elevated pulmonary artery systolic pressure. The estimated right ventricular systolic pressure is 41.6 mmHg.  3. Left atrial size was moderately dilated.  4. The mitral valve is normal in structure. Moderate mitral valve regurgitation.  5. Vena Contraca width 11 mm. The tricuspid valve is abnormal. Tricuspid valve regurgitation is severe.  6. The aortic valve is tricuspid. Aortic valve regurgitation is not visualized.  7. The inferior vena cava is dilated in size with <50% respiratory variability, suggesting right atrial pressure of 15 mmHg. Comparison(s): Prior images reviewed side by side. Nominal improvement in LV fuction; further dilation of left ventricle; RV dilation is now severe. Conclusion(s)/Recommendation(s): No evidence of valvular vegetations on this transthoracic echocardiogram. Consider a transesophageal echocardiogram to exclude infective endocarditis if clinically indicated. FINDINGS  Left Ventricle: Left ventricular ejection fraction, by estimation, is <20%. Left ventricular ejection fraction by PLAX is 26 %. The left ventricle has severely decreased function. The left ventricle demonstrates global hypokinesis. Strain was performed and the global longitudinal strain is indeterminate. The left ventricular internal cavity size was  severely dilated. There is no left ventricular hypertrophy. Left ventricular diastolic parameters are indeterminate. Right Ventricle: Evidence of RV-PA Uncoupling. The right ventricular size is severely enlarged. No increase in right ventricular wall thickness. Right ventricular systolic function is severely reduced. There is mildly elevated pulmonary artery systolic pressure. The tricuspid regurgitant velocity is 2.58 m/s, and with an assumed right atrial pressure of 15 mmHg, the estimated right ventricular systolic pressure is 41.6 mmHg. Left Atrium: Left atrial size was moderately dilated. Right Atrium: Right atrial size was normal in size. Pericardium: There is no evidence of pericardial effusion. Mitral Valve: The mitral valve is normal in structure. Moderate mitral valve regurgitation. Tricuspid Valve: Vena Contraca width 11 mm. The tricuspid valve is abnormal. Tricuspid valve regurgitation is severe. Aortic Valve: The aortic valve is tricuspid. Aortic valve regurgitation is not visualized. Pulmonic Valve: The pulmonic valve was normal in structure. Pulmonic valve regurgitation is mild. No evidence of pulmonic stenosis. Aorta: The aortic root and ascending aorta are structurally normal, with no evidence of dilitation. Venous: The inferior vena cava is dilated in size with less than 50% respiratory variability, suggesting right atrial pressure of 15 mmHg. IAS/Shunts: The atrial septum is grossly normal. Additional Comments: 3D was performed not  requiring image post processing on an independent workstation and was indeterminate.  LEFT VENTRICLE PLAX 2D LV EF:         Left ventricular ejection fraction by PLAX is 26 %. LVIDd:         7.10 cm LVIDs:         6.20 cm LV PW:         0.90 cm LV IVS:        0.80 cm LVOT diam:     2.30 cm LV SV:         39 LV SV Index:   14 LVOT Area:     4.15 cm  RIGHT VENTRICLE RV S prime:     6.42 cm/s TAPSE (M-mode): 1.1 cm LEFT ATRIUM            Index        RIGHT ATRIUM            Index LA diam:      4.40 cm  1.51 cm/m   RA Area:     26.70 cm LA Vol (A2C): 130.0 ml 44.71 ml/m  RA Volume:   88.10 ml  30.30 ml/m LA Vol (A4C): 102.0 ml 35.08 ml/m  AORTIC VALVE LVOT Vmax:   64.20 cm/s LVOT Vmean:  49.000 cm/s LVOT VTI:    0.094 m  AORTA Ao Root diam: 3.00 cm Ao Asc diam:  2.70 cm TRICUSPID VALVE TR Peak grad:   26.6 mmHg TR Vmax:        258.00 cm/s  SHUNTS Systemic VTI:  0.09 m Systemic Diam: 2.30 cm Gloriann Larger MD Electronically signed by Gloriann Larger MD Signature Date/Time: 11/05/2023/12:33:49 PM    Final    CT FEMUR RIGHT WO CONTRAST Result Date: 11/01/2023 CLINICAL DATA:  Cellulitis. Dialysis patient Monday Wednesday Friday. Missed yesterday. Vomited last night. History of chronic sacral wounds. EXAM: CT OF THE LOWER RIGHT EXTREMITY WITHOUT CONTRAST TECHNIQUE: Multidetector CT imaging of the right lower extremity was performed according to the standard protocol. RADIATION DOSE REDUCTION: This exam was performed according to the departmental dose-optimization program which includes automated exposure control, adjustment of the mA and/or kV according to patient size and/or use of iterative reconstruction technique. COMPARISON:  CT pelvis 07/01/2023 FINDINGS: Bones/Joint/Cartilage There is decreased bony detail due to patient large body habitus and photon starvation artifact. Mild lateral right knee patellofemoral joint space narrowing. Moderate superior right femoroacetabular joint space narrowing. No acute fracture is seen. No cortical erosion is identified. Ligaments Suboptimally assessed by CT. Muscles and Tendons No gross abnormality is seen in size or density of the regional musculature. No gross tendon tear is seen. Soft tissues Large body habitus. Diffuse moderate subcutaneous fat edema and swelling throughout the right thigh and visualized portion of the medial left thigh. There is moderate skin thickening diffusely. No definite walled-off abscess is seen, within  the limitations of the photon starvation and artifact. No knee joint effusion is seen. There are multiple prominent right inguinal lymph nodes, measuring up to 10 mm in short axis. These are presumably reactive. IMPRESSION: 1. Diffuse moderate subcutaneous fat edema and swelling throughout the right thigh and visualized portion of the medial left thigh. There is moderate skin thickening diffusely. No definite walled-off abscess is seen, within the limitations of large body habitus and associated photon starvation/artifact. 2. No acute fracture is seen. No cortical erosion is identified. 3. Multiple prominent right inguinal lymph nodes, measuring up to 10 mm in short axis. These are presumably  reactive. Electronically Signed   By: Neita Garnet M.D.   On: 11/01/2023 16:56   CT PELVIS WO CONTRAST Result Date: 11/01/2023 CLINICAL DATA:  Vomiting.  Cellulitis. EXAM: CT PELVIS WITHOUT CONTRAST TECHNIQUE: Multidetector CT imaging of the pelvis was performed following the standard protocol without intravenous contrast. RADIATION DOSE REDUCTION: This exam was performed according to the departmental dose-optimization program which includes automated exposure control, adjustment of the mA and/or kV according to patient size and/or use of iterative reconstruction technique. COMPARISON:  July 01, 2023. FINDINGS: Urinary Tract:  Urinary bladder is decompressed. Bowel:  Unremarkable visualized pelvic bowel loops. Vascular/Lymphatic: No pathologically enlarged lymph nodes. No significant vascular abnormality seen. Reproductive:  Prostate is unremarkable. Other: No ascites or hernia is noted. Stable irregular densities are seen in the subcutaneous tissues posterior to the sacrum which may represent scarring, or possibly cellulitis. Definite fluid collection or abscess is not identified. Musculoskeletal: No suspicious bone lesions identified. IMPRESSION: Stable irregular densities are noted in the subcutaneous tissues posterior  to the sacrum which may represent cellulitis or scarring from prior inflammation or infection. No definite fluid collection or abscess is noted. Electronically Signed   By: Lupita Raider M.D.   On: 11/01/2023 13:19   DG Chest Port 1 View Result Date: 11/01/2023 CLINICAL DATA:  Shortness of breath EXAM: PORTABLE CHEST 1 VIEW COMPARISON:  10/10/2023 FINDINGS: Right dialysis catheter remains in place, unchanged. Cardiomegaly. No confluent airspace opacities, effusions or edema. No acute bony abnormality. IMPRESSION: Cardiomegaly.  No active disease. Electronically Signed   By: Charlett Nose M.D.   On: 11/01/2023 10:23   DG Chest Portable 1 View Result Date: 10/10/2023 CLINICAL DATA:  Shortness of breath EXAM: PORTABLE CHEST 1 VIEW COMPARISON:  09/21/2023 FINDINGS: Right dialysis catheter remains in place, unchanged. Cardiomegaly with vascular congestion and probable interstitial edema. No effusions. No acute bony abnormality. IMPRESSION: Cardiomegaly with vascular congestion and probable mild interstitial edema. Electronically Signed   By: Charlett Nose M.D.   On: 10/10/2023 18:05    Labs:  CBC: Recent Labs    11/04/23 0614 11/05/23 0713 11/06/23 1955 11/08/23 0500  WBC 6.9 7.4 7.6 8.1  HGB 10.2* 10.5* 10.9* 11.1*  HCT 31.4* 32.4* 33.8* 34.8*  PLT 204 210 235 218    COAGS: Recent Labs    11/01/23 1004  INR 1.5*    BMP: Recent Labs    11/06/23 0832 11/07/23 0457 11/08/23 0500 11/09/23 0502  NA 132* 132* 133* 132*  K 4.4 4.2 4.7 4.9  CL 95* 95* 94* 95*  CO2 22 23 24 23   GLUCOSE 91 80 95 102*  BUN 28* 18 27* 35*  CALCIUM 9.4 9.4 9.8 9.7  CREATININE 6.87* 5.32* 7.20* 8.62*  GFRNONAA 10* 13* 9* 7*    LIVER FUNCTION TESTS: Recent Labs    07/01/23 2208 07/02/23 0637 08/30/23 0250 08/30/23 0540 09/07/23 1452 09/08/23 0339 11/01/23 1004 11/02/23 0523 11/06/23 0832 11/07/23 0457 11/08/23 0500 11/09/23 0502  BILITOT 1.0  --  1.3*  --  1.1  --  1.0  --   --   --   --    --   AST 28  --  22  --  26  --  19  --   --   --   --   --   ALT 6  --  26  --  18  --  16  --   --   --   --   --  ALKPHOS 87  --  141*  --  125  --  208*  --   --   --   --   --   PROT 7.0  --  7.1  --  6.8  --  7.2  --   --   --   --   --   ALBUMIN 3.1*   < > 3.5   < > 3.3*   < > 3.1*   < > 3.0* 3.3* 3.0* 3.0*   < > = values in this interval not displayed.    TUMOR MARKERS: No results for input(s): "AFPTM", "CEA", "CA199", "CHROMGRNA" in the last 8760 hours.  Assessment and Plan: Patient is known to IR service. He was admitted for enterococcus bacteremia. Patient was recommended for line holiday. His tunneled hemodialysis catheter was removed on 4/14, and scheduled to be replaced on 4/17.  Patient will present for scheduled tunneled hemodialysis catheter placement in IR tomorrow.  Risks and benefits discussed with the patient including, but not limited to bleeding, infection, vascular injury, pneumothorax which may require chest tube placement, air embolism or even death  All of the patient's questions were answered, patient is agreeable to proceed.  Consent signed and in chart.      Thank you for allowing our service to participate in Phillip Young 's care.  Electronically Signed: Lovena Rubinstein, PA-C   11/09/2023, 9:29 AM      I spent a total of 40 Minutes  in face to face in clinical consultation, greater than 50% of which was counseling/coordinating care for bacteremia, with consideration for tunneled hemodialysis catheter placement status post line holiday.

## 2023-11-09 NOTE — Progress Notes (Signed)
 Patient requesting additional cranberry juices from staff. Patient has been educated on fluid restriction. Patient verbalizes understanding of fluid restriction and tells this nurse "I can go over my fluid restriction, just document it." This nurse attempted to explain to patient the purpose of fluid restriction, including critical time during line holiday to limit fluid due to missing normal dialysis day to which patient replied "this ain't prison, this is a hospital. I'm in charge here and you have to do what I say." This nurse again attempted to educate patient to which patient demanded this nurse ask the doctor to "allow me over my fluid restriction." Patient also informed this nurse that he did not wish to have [facility] placed tomorrow "because this is what happens when y'all poke the bear." This nurse asked patient to elaborate and patient reported that he did not wish to have TDC placed. This nurse asked patient what would happen if he was unable to get dialysis, he reported "They'll just put an emergent line in, I've done this before." Patient asked this nurse leave the room, call bell within reach. Dr. Esaw Heckler notified.

## 2023-11-10 ENCOUNTER — Inpatient Hospital Stay (HOSPITAL_COMMUNITY)

## 2023-11-10 DIAGNOSIS — I482 Chronic atrial fibrillation, unspecified: Secondary | ICD-10-CM | POA: Diagnosis not present

## 2023-11-10 DIAGNOSIS — F4323 Adjustment disorder with mixed anxiety and depressed mood: Secondary | ICD-10-CM | POA: Diagnosis not present

## 2023-11-10 DIAGNOSIS — R7881 Bacteremia: Secondary | ICD-10-CM | POA: Diagnosis not present

## 2023-11-10 DIAGNOSIS — J9621 Acute and chronic respiratory failure with hypoxia: Secondary | ICD-10-CM | POA: Diagnosis not present

## 2023-11-10 HISTORY — PX: IR FLUORO GUIDE CV LINE RIGHT: IMG2283

## 2023-11-10 HISTORY — PX: IR US GUIDE VASC ACCESS RIGHT: IMG2390

## 2023-11-10 LAB — RENAL FUNCTION PANEL
Albumin: 3 g/dL — ABNORMAL LOW (ref 3.5–5.0)
Anion gap: 19 — ABNORMAL HIGH (ref 5–15)
BUN: 45 mg/dL — ABNORMAL HIGH (ref 6–20)
CO2: 19 mmol/L — ABNORMAL LOW (ref 22–32)
Calcium: 9.9 mg/dL (ref 8.9–10.3)
Chloride: 95 mmol/L — ABNORMAL LOW (ref 98–111)
Creatinine, Ser: 9.8 mg/dL — ABNORMAL HIGH (ref 0.61–1.24)
GFR, Estimated: 6 mL/min — ABNORMAL LOW (ref >60)
Glucose, Bld: 104 mg/dL — ABNORMAL HIGH (ref 70–99)
Phosphorus: 6.3 mg/dL — ABNORMAL HIGH (ref 2.5–4.6)
Potassium: 5.2 mmol/L — ABNORMAL HIGH (ref 3.5–5.1)
Sodium: 133 mmol/L — ABNORMAL LOW (ref 135–145)

## 2023-11-10 MED ORDER — FENTANYL CITRATE (PF) 100 MCG/2ML IJ SOLN
INTRAMUSCULAR | Status: AC | PRN
  Administered 2023-11-10 (×2): 50 ug via INTRAVENOUS

## 2023-11-10 MED ORDER — LIDOCAINE HCL 1 % IJ SOLN
INTRAMUSCULAR | Status: AC
  Filled 2023-11-10: qty 20

## 2023-11-10 MED ORDER — HEPARIN SODIUM (PORCINE) 1000 UNIT/ML IJ SOLN
INTRAMUSCULAR | Status: AC
  Filled 2023-11-10: qty 10

## 2023-11-10 MED ORDER — VANCOMYCIN HCL IN DEXTROSE 1-5 GM/200ML-% IV SOLN
INTRAVENOUS | Status: AC | PRN
  Administered 2023-11-10: 1000 mg via INTRAVENOUS

## 2023-11-10 MED ORDER — MIDAZOLAM HCL 2 MG/2ML IJ SOLN
INTRAMUSCULAR | Status: AC
  Filled 2023-11-10: qty 4

## 2023-11-10 MED ORDER — VANCOMYCIN HCL IN DEXTROSE 1-5 GM/200ML-% IV SOLN: 1000.0000 mg | INTRAVENOUS | Status: DC | PRN

## 2023-11-10 MED ORDER — ANTICOAGULANT SODIUM CITRATE 4% (200MG/5ML) IV SOLN: 5.0000 mL | Freq: Once | Status: DC

## 2023-11-10 MED ORDER — LIDOCAINE HCL 1 % IJ SOLN
20.0000 mL | Freq: Once | INTRAMUSCULAR | Status: AC
  Administered 2023-11-10: 10 mL

## 2023-11-10 MED ORDER — SODIUM ZIRCONIUM CYCLOSILICATE 10 G PO PACK
10.0000 g | PACK | Freq: Once | ORAL | Status: AC
  Administered 2023-11-10: 10 g via ORAL
  Filled 2023-11-10: qty 1

## 2023-11-10 MED ORDER — DIPHENHYDRAMINE HCL 50 MG/ML IJ SOLN
INTRAMUSCULAR | Status: AC
  Filled 2023-11-10: qty 1

## 2023-11-10 MED ORDER — MIDAZOLAM HCL 2 MG/2ML IJ SOLN
INTRAMUSCULAR | Status: AC | PRN
Start: 2023-11-10 — End: 2023-11-10
  Administered 2023-11-10 (×2): .5 mg via INTRAVENOUS
  Administered 2023-11-10: 1 mg via INTRAVENOUS

## 2023-11-10 MED ORDER — ANTICOAGULANT SODIUM CITRATE 4% (200MG/5ML) IV SOLN
10.0000 mL | Freq: Once | Status: AC
Start: 2023-11-10 — End: 2023-11-10
  Administered 2023-11-10: 3.8 mL
  Filled 2023-11-10: qty 10

## 2023-11-10 MED ORDER — VANCOMYCIN HCL IN DEXTROSE 1-5 GM/200ML-% IV SOLN
INTRAVENOUS | Status: AC
  Filled 2023-11-10: qty 200

## 2023-11-10 MED ORDER — FENTANYL CITRATE (PF) 100 MCG/2ML IJ SOLN
INTRAMUSCULAR | Status: AC
  Filled 2023-11-10: qty 4

## 2023-11-10 MED ORDER — NEPRO/CARBSTEADY PO LIQD: 237.0000 mL | Freq: Two times a day (BID) | ORAL | Status: DC

## 2023-11-10 NOTE — Progress Notes (Signed)
                  Subjective:   Summary: Phillip Young is a 38 yo male with chronic hypoxic respiratory failure on 2-4L home O2, HFrEF w/LVEF <20%, Afib on Eliquis, chronic pain, ESRD on HD MWF who presented to the ED with shortness of breath. He is being treated for volume overload due to missed HD session as well as E faecalis and MRSE bacteremia  Patient is feeling well today, no new complaints. He is prepared for [facility] placement today. Noted by nursing he was unwilling to abide by fluid restrictions last night, however he did not break NPO in preparation for procedure today.   Objective:  Vital signs in last 24 hours: Vitals:   11/09/23 0749 11/09/23 1732 11/09/23 2027 11/10/23 0500  BP: (!) 123/105 (!) 116/100 (!) 144/121   Pulse: 63 91 (!) 102   Resp: 18 18 18    Temp: 98.3 F (36.8 C) 98.1 F (36.7 C) 98.9 F (37.2 C)   TempSrc:      SpO2: 98% 100% 100%   Weight:    (!) 180.1 kg  Height:       Supplemental O2: Nasal Cannula SpO2: 100 % O2 Flow Rate (L/min): 6 L/min FiO2 (%): 21 %  Physical Exam:  Const: chronically ill appearing. Fatigued, soft voice. No acute distress Neuro: A&O x 3  Assessment/Plan:   Enterococcus faecalis and MRSE bacteremia Blood cultures positive for Enterococcus faecalis and staph epidermitis, repeat blood cultures from 4/9 and 4/14 with NGTD. Patient remains relatively asymptomatic with no fever, leukocytosis or other systemic symptoms.  TDC was pulled 4/14, plan to replace today.  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 Near his baseline of 2-4L [place]  chronically. Patient will receive HD today following TDC placement. If he is feeling well tomorrow, he may be discharged to resume his regular outpatient HD schedule.  - NPO since midnight for [facility] placement today by IR - HD today per nephrology  Chronic somatic and neuropathic pain Palliative team has seen patient and recommended methadone or fentanyl patch for his severe chronic leg pain. I feel a fentanyl patch will be easier to manage long term, particularly as transportation to PCP appointments has been a struggle in the past.  - Fentanyl patch 12 mcg/hr q72hr starting 4/15 - Continue venlafaxine 37.5mg  daily, lyrica 50mg  daily, oxycodone 10mg  q6h prn, Sarna lotion  Anemia of chronic disease: Hgb stable, no evidence of acute blood loss Paroxysmal atrial fibrillation: Continue home Eliquis 5mg  BID, Digoxin 0.125mg  MWF Hypothyroidism: Continue home levothyroxine 25mcg Depression: Continue home venlafaxine 37.5mg   Diet: Renal VTE: Eliquis Code: Full  Dispo: Anticipated discharge to Home pending medical stability.   [name], MD Internal Medicine Resident, PGY-1 Please contact the on call pager at [phone] for any urgent or emergent needs. 8:03 AM 11/10/2023

## 2023-11-10 NOTE — Plan of Care (Signed)
  Problem: Education: Goal: Knowledge of disease and its progression will improve Outcome: Progressing   Problem: Fluid Volume: Goal: Compliance with measures to maintain balanced fluid volume will improve Outcome: Progressing   Problem: Health Behavior/Discharge Planning: Goal: Ability to manage health-related needs will improve Outcome: Progressing   Problem: Nutritional: Goal: Ability to make healthy dietary choices will improve Outcome: Progressing   Problem: Clinical Measurements: Goal: Complications related to the disease process, condition or treatment will be avoided or minimized Outcome: Progressing   Problem: Education: Goal: Knowledge of General Education information will improve Description: Including pain rating scale, medication(s)/side effects and non-pharmacologic comfort measures Outcome: Progressing   Problem: Health Behavior/Discharge Planning: Goal: Ability to manage health-related needs will improve Outcome: Progressing   Problem: Clinical Measurements: Goal: Ability to maintain clinical measurements within normal limits will improve Outcome: Progressing Goal: Will remain free from infection Outcome: Progressing Goal: Diagnostic test results will improve Outcome: Progressing Goal: Respiratory complications will improve Outcome: Progressing Goal: Cardiovascular complication will be avoided Outcome: Progressing   Problem: Activity: Goal: Risk for activity intolerance will decrease Outcome: Progressing   Problem: Nutrition: Goal: Adequate nutrition will be maintained Outcome: Progressing   Problem: Coping: Goal: Level of anxiety will decrease Outcome: Progressing   Problem: Elimination: Goal: Will not experience complications related to bowel motility Outcome: Progressing Goal: Will not experience complications related to urinary retention Outcome: Progressing   Problem: Pain Managment: Goal: General experience of comfort will improve  and/or be controlled Outcome: Progressing   Problem: Safety: Goal: Ability to remain free from injury will improve Outcome: Progressing   Problem: Skin Integrity: Goal: Risk for impaired skin integrity will decrease Outcome: Progressing

## 2023-11-10 NOTE — Progress Notes (Signed)
 [facility] Progress Note   Subjective: Seen in room. No SOB reported but K+ climbing. Give lokelma this AM. HD later this afternoon post TDC insertion.      Objective Vitals:   11/10/23 1120 11/10/23 1125 11/10/23 1130 11/10/23 1135  BP: 118/77 (!) 125/107 (!) 121/90 (!) 127/94  Pulse: (!) 123 (!) 112 (!) 113 (!) 126  Resp: (!) 21 20 15 20   Temp:      TempSrc:      SpO2: 97% 97% 98% 98%  Weight:      Height:       Physical Exam General: obese male in NAD Heart: RRR, no murmurs, rubs or gallops Lungs: CTA anteriorly, respirations unlabored Abdomen: Soft, +BS Extremities: 2+ pitting edema b/l lower extremities Dialysis Access: No access.    Additional Objective Labs: Basic Metabolic Panel: Recent Labs  Lab 11/08/23 0500 11/09/23 0502 11/10/23 0454  NA 133* 132* 133*  K 4.7 4.9 5.2*  CL 94* 95* 95*  CO2 24 23 19*  GLUCOSE 95 102* 104*  BUN 27* 35* 45*  CREATININE 7.20* 8.62* 9.80*  CALCIUM 9.8 9.7 9.9  PHOS 5.6* 6.5* 6.3*   Liver Function Tests: Recent Labs  Lab 11/08/23 0500 11/09/23 0502 11/10/23 0454  ALBUMIN 3.0* 3.0* 3.0*   No results for input(s): "LIPASE", "AMYLASE" in the last 168 hours. CBC: Recent Labs  Lab 11/04/23 0614 11/05/23 0713 11/06/23 1955 11/08/23 0500  WBC 6.9 7.4 7.6 8.1  HGB 10.2* 10.5* 10.9* 11.1*  HCT 31.4* 32.4* 33.8* 34.8*  MCV 101.9* 102.2* 101.2* 103.0*  PLT 204 210 235 218   Blood Culture    Component Value Date/Time   SDES BLOOD RIGHT HAND 11/07/2023 1953   SPECREQUEST  11/07/2023 1953    BOTTLES DRAWN AEROBIC AND ANAEROBIC Blood Culture adequate volume   CULT  11/07/2023 1953    NO GROWTH 3 DAYS Performed at [facility], [address] [place], [place] [address]    REPTSTATUS PENDING 11/07/2023 1953    Cardiac Enzymes: No results for input(s): "CKTOTAL", "CKMB", "CKMBINDEX", "TROPONINI" in the last 168 hours. CBG: No results for input(s): "GLUCAP" in the last 168 hours. Iron  Studies: No results for input(s): "IRON", "TIBC", "TRANSFERRIN", "FERRITIN" in the last 72 hours. @lablastinr3 @ Studies/Results: No results found. Medications:  vancomycin      apixaban  5 mg Oral BID   Chlorhexidine Gluconate Cloth  6 each Topical Q0600   cinacalcet  30 mg Oral Q M,W,F-HD   digoxin  0.125 mg Oral Q M,W,F   fentaNYL  1 patch Transdermal Q72H   levothyroxine  25 mcg Oral Q0600   midodrine  30 mg Oral TID WC   pregabalin  50 mg Oral Daily   sevelamer carbonate  1,600 mg Oral TID WC   vancomycin variable dose per unstable renal function (pharmacist dosing)   Does not apply See admin instructions   venlafaxine XR  37.5 mg Oral Daily     OP Dialysis Orders: MWF - [facility] 4:30hr, 400/800, EDW 155.5kg, 2K/2Ca, TDC, no heparin (HIT) - Mircera IV q 2 weeks - last 4/4 - Sensipar 30mg  PO HD   Assessment/Plan:  Acute on Chronic Resp Failure: Chronically overloaded.   Sacral Cellulitis: ID recommending line holiday.  On abx per ID. Finally agreed to have TDC removed, removed in IR 11/07/2023. Will need to replace 11/10/2023. If urgent HD needs, will need Temp Cath placed. ABX per primary Mild hyperkalemia: Lokelma 10 gram PO now.  HD later today. Follow K+.   ESRD: Normally MWF schedule. Next HD 11/10/2023. Hoping he can go that long and will not need temp catheter.   Hypotension/volume: BP low, stable on mido. Severe edema-chronic issue. UF as tolerated.   Anemia: Hgb 11.5 - Next ESA due 11/11/2023 but does not need. Follow HGB.   Metabolic bone disease: Corrected Ca+ upper limit of normal. No VDRA. PO4 at goal. Continue home binders/sensipar. Low albumin-add nepro.   Dorotha Hirschi H. Dontray Haberland NP-C 11/10/2023, 11:46 AM  [employer] [phone]

## 2023-11-10 NOTE — Progress Notes (Signed)
   11/10/23 2009  BiPAP/CPAP/SIPAP  BiPAP/CPAP/SIPAP Pt Type Adult  Patient Home Machine Yes  Patient Home Mask Yes  Patient Home Tubing Yes

## 2023-11-10 NOTE — Progress Notes (Signed)
 Pt. Has home bipap unit. Pt. Able to operate and place on himself.

## 2023-11-10 NOTE — Progress Notes (Signed)
 Patient refused some medication and care because he wants to drink over his fluid restriction. Educated patient on the importance of the fluid restriction. MD on call and charge Nurse notified.

## 2023-11-10 NOTE — Procedures (Signed)
 Interventional Radiology Procedure Note  Procedure: Tunneled HD catheter placement  Complications: None  Estimated Blood Loss: < 10 mL  Findings: Tunneled Palindrome catheter measuring 23 cm from tip to cuff via right internal jugular vein. Tip in RA. OK to use.  Nonda Bays. Nereida Banning, M.D Pager:  [phone]

## 2023-11-10 NOTE — Procedures (Signed)
 Called primary RN  [facility] to inquire about [name] coming for his dialysis treatment today. RN was in the presents of the patient. I could hear pt refusing to come for his treatment today. RN [facility] was educating pt on reasons why he needed to come for treatment. I could hear pt in the back ground wanting to know who the nurse was for him in dialysis today. Pt requested a picture of me to see if he wanted to come to the unit. Myself and [facility] RN said no he couldn't have a picture of me, but he could still come to the unit for treatment. Pt then asked if his mother could come, I told [name] pt is aware that policy will not allow visitors in the unit for the privacy of others. Pt then said he wasn't going to come for treatment because he did not want to. [name] RN explained to the pt again reasons why he needed treatment, I could hear [name] verbalize an understanding, but still refused. Manager Louellen Route informed of the conversation above

## 2023-11-11 DIAGNOSIS — B952 Enterococcus as the cause of diseases classified elsewhere: Secondary | ICD-10-CM | POA: Diagnosis not present

## 2023-11-11 DIAGNOSIS — R7881 Bacteremia: Secondary | ICD-10-CM | POA: Diagnosis not present

## 2023-11-11 LAB — RENAL FUNCTION PANEL
Albumin: 3.1 g/dL — ABNORMAL LOW (ref 3.5–5.0)
Albumin: 3.1 g/dL — ABNORMAL LOW (ref 3.5–5.0)
Anion gap: 17 — ABNORMAL HIGH (ref 5–15)
Anion gap: 17 — ABNORMAL HIGH (ref 5–15)
BUN: 54 mg/dL — ABNORMAL HIGH (ref 6–20)
BUN: 57 mg/dL — ABNORMAL HIGH (ref 6–20)
CO2: 20 mmol/L — ABNORMAL LOW (ref 22–32)
CO2: 21 mmol/L — ABNORMAL LOW (ref 22–32)
Calcium: 9.8 mg/dL (ref 8.9–10.3)
Calcium: 9.8 mg/dL (ref 8.9–10.3)
Chloride: 96 mmol/L — ABNORMAL LOW (ref 98–111)
Chloride: 97 mmol/L — ABNORMAL LOW (ref 98–111)
Creatinine, Ser: 10.76 mg/dL — ABNORMAL HIGH (ref 0.61–1.24)
Creatinine, Ser: 11.16 mg/dL — ABNORMAL HIGH (ref 0.61–1.24)
GFR, Estimated: 6 mL/min — ABNORMAL LOW (ref >60)
GFR, Estimated: 5 mL/min — ABNORMAL LOW (ref >60)
Glucose, Bld: 107 mg/dL — ABNORMAL HIGH (ref 70–99)
Glucose, Bld: 117 mg/dL — ABNORMAL HIGH (ref 70–99)
Phosphorus: 7 mg/dL — ABNORMAL HIGH (ref 2.5–4.6)
Phosphorus: 6.9 mg/dL — ABNORMAL HIGH (ref 2.5–4.6)
Potassium: 5.4 mmol/L — ABNORMAL HIGH (ref 3.5–5.1)
Potassium: 5.6 mmol/L — ABNORMAL HIGH (ref 3.5–5.1)
Sodium: 133 mmol/L — ABNORMAL LOW (ref 135–145)
Sodium: 135 mmol/L (ref 135–145)

## 2023-11-11 LAB — CBC
HCT: 32.8 % — ABNORMAL LOW (ref 39.0–52.0)
Hemoglobin: 10.4 g/dL — ABNORMAL LOW (ref 13.0–17.0)
MCH: 32.2 pg (ref 26.0–34.0)
MCHC: 31.7 g/dL (ref 30.0–36.0)
MCV: 101.5 fL — ABNORMAL HIGH (ref 80.0–100.0)
Platelets: 201 10*3/uL (ref 150–400)
RBC: 3.23 MIL/uL — ABNORMAL LOW (ref 4.22–5.81)
RDW: 15.9 % — ABNORMAL HIGH (ref 11.5–15.5)
WBC: 8.8 10*3/uL (ref 4.0–10.5)
nRBC: 0 % (ref 0.0–0.2)

## 2023-11-11 LAB — VANCOMYCIN, RANDOM: Vancomycin Rm: 35 ug/mL

## 2023-11-11 MED ORDER — SODIUM ZIRCONIUM CYCLOSILICATE 10 G PO PACK: 10.0000 g | PACK | Freq: Once | ORAL | Status: DC

## 2023-11-11 MED ORDER — SODIUM ZIRCONIUM CYCLOSILICATE 10 G PO PACK
10.0000 g | PACK | Freq: Once | ORAL | Status: DC
  Filled 2023-11-11: qty 1

## 2023-11-11 MED ORDER — ANTICOAGULANT SODIUM CITRATE 4% (200MG/5ML) IV SOLN
5.0000 mL | Status: DC | PRN
  Filled 2023-11-11: qty 5

## 2023-11-11 MED ORDER — ALTEPLASE 2 MG IJ SOLR: 2.0000 mg | Freq: Once | INTRAMUSCULAR | Status: DC | PRN

## 2023-11-11 NOTE — Progress Notes (Signed)
                  Subjective:   Summary: Phillip Young is a 38 yo male with chronic hypoxic respiratory failure on 2-4L home O2, HFrEF w/LVEF <20%, Afib on Eliquis , chronic pain, ESRD on HD MWF who presented to the ED with shortness of breath. He is being treated for volume overload due to missed HD session as well as E faecalis and MRSE bacteremia  Patient is feeling well today. The fentanyl  patch has been helping significantly with his chronic leg pain. He did not want to have HD yesterday due to personal issues with members of the staff. He feels he would like HD today.   Objective:  Vital signs in last 24 hours: Vitals:   11/10/23 1602 11/10/23 2015 11/11/23 0519 11/11/23 0833  BP: 103/70 100/77 110/82 99/87  Pulse: 89 (!) 53 99 93  Resp: 19 18 18 19   Temp:   98.9 F (37.2 C) 98.8 F (37.1 C)  TempSrc:      SpO2: 96% 100% 99% 99%  Weight:      Height:       Supplemental O2: Nasal Cannula SpO2: 99 % O2 Flow Rate (L/min): 6 L/min FiO2 (%): 21 %  Physical Exam:  Const: chronically ill appearing. Fatigued, soft voice. No acute distress Neuro: A&O x 3  Assessment/Plan:   Enterococcus faecalis and MRSE bacteremia Blood cultures positive for Enterococcus faecalis and staph epidermitis, repeat blood cultures from 4/9 and 4/14 with NGTD. Patient remains relatively asymptomatic with no fever, leukocytosis or other systemic symptoms. TDC pulled on 4/14, replaced on 4/17.  - Per ID, vancomycin  for 6 week course during HD, end date May 26 - ID outpatient follow up scheduled for 12/27/23  Acute on chronic hypoxic respiratory failure  Hx presumed amiodarone  toxicity HFrEF, LVEF <20% ESRD on HD Patient continues to require 4-6L [place] to maintain appropriate O2 saturations. Near his baseline of 2-4L [place] chronically.  - HD today. Can likely be discharged afterwards, today or tomorrow  Chronic somatic and neuropathic pain Fentanyl  patch as well as multimodal pain regimen has been helping  significantly with his chronic pain - Fentanyl  patch 12 mcg/hr q72hr starting 4/15. Replace today - Continue venlafaxine  37.5mg  daily, lyrica  50mg  daily, oxycodone  10mg  q6h prn, Sarna lotion  Anemia of chronic disease: Hgb stable, no evidence of acute blood loss Paroxysmal atrial fibrillation: Continue home Eliquis  5mg  BID, Digoxin  0.125mg  MWF Hypothyroidism: Continue home levothyroxine  Depression: Continue home venlafaxine  37.5mg   Diet: Renal VTE: Eliquis  Code: Full  Dispo: Anticipated discharge to Home pending medical stability.   [name], MD Internal Medicine Resident, PGY-1 Please contact the on call pager at [phone] for any urgent or emergent needs. 12:58 PM 11/11/2023

## 2023-11-11 NOTE — Progress Notes (Cosign Needed Addendum)
 [facility] Progress Note   Subjective: TDC placed 11/10/2023 but patient refused to go to HD. HD today. He wants permanent access placed but this can be done as OP. K+ 5.4. Give lokelma  now until he can go to HD.   Objective Vitals:   11/10/23 1602 11/10/23 2015 11/11/23 0519 11/11/23 0833  BP: 103/70 100/77 110/82 99/87  Pulse: 89 (!) 53 99 93  Resp: 19 18 18 19   Temp:   98.9 F (37.2 C) 98.8 F (37.1 C)  TempSrc:      SpO2: 96% 100% 99% 99%  Weight:      Height:       Physical Exam General: obese male in NAD Heart: RRR, no murmurs, rubs or gallops Lungs: CTA anteriorly, respirations unlabored Abdomen: Soft, +BS Extremities: 2+ pitting edema b/l lower extremities Dialysis Access: RIJ TDC Drsg intact.    Additional Objective Labs: Basic Metabolic Panel: Recent Labs  Lab 11/09/23 0502 11/10/23 0454 11/11/23 0520  NA 132* 133* 133*  K 4.9 5.2* 5.4*  CL 95* 95* 96*  CO2 23 19* 20*  GLUCOSE 102* 104* 107*  BUN 35* 45* 54*  CREATININE 8.62* 9.80* 10.76*  CALCIUM  9.7 9.9 9.8  PHOS 6.5* 6.3* 7.0*   Liver Function Tests: Recent Labs  Lab 11/09/23 0502 11/10/23 0454 11/11/23 0520  ALBUMIN  3.0* 3.0* 3.1*   No results for input(s): "LIPASE", "AMYLASE" in the last 168 hours. CBC: Recent Labs  Lab 11/05/23 0713 11/06/23 1955 11/08/23 0500  WBC 7.4 7.6 8.1  HGB 10.5* 10.9* 11.1*  HCT 32.4* 33.8* 34.8*  MCV 102.2* 101.2* 103.0*  PLT 210 235 218   Blood Culture    Component Value Date/Time   SDES BLOOD RIGHT HAND 11/07/2023 1953   SPECREQUEST  11/07/2023 1953    BOTTLES DRAWN AEROBIC AND ANAEROBIC Blood Culture adequate volume   CULT  11/07/2023 1953    NO GROWTH 4 DAYS Performed at [facility], [address] [place], [place] [address]    REPTSTATUS PENDING 11/07/2023 1953    Cardiac Enzymes: No results for input(s): "CKTOTAL", "CKMB", "CKMBINDEX", "TROPONINI" in the last 168 hours. CBG: No results for input(s): "GLUCAP" in  the last 168 hours. Iron Studies: No results for input(s): "IRON", "TIBC", "TRANSFERRIN", "FERRITIN" in the last 72 hours. @lablastinr3 @ Studies/Results: IR Fluoro Guide CV Line Right Result Date: 11/10/2023 CLINICAL DATA:  Status post removal of tunneled hemodialysis catheter for bacteremia on 11/07/2023. Now in need of a new tunneled catheter for continued hemodialysis. EXAM: TUNNELED CENTRAL VENOUS HEMODIALYSIS CATHETER PLACEMENT WITH ULTRASOUND AND FLUOROSCOPIC GUIDANCE ANESTHESIA/SEDATION: Moderate (conscious) sedation was employed during this procedure. A total of Versed  2.0 mg and Fentanyl  100 mcg was administered intravenously. Moderate Sedation Time: 27 minutes. The patient's level of consciousness and vital signs were monitored continuously by radiology nursing throughout the procedure under my direct supervision. MEDICATIONS: 1 g IV vancomycin . FLUOROSCOPY: 2 minutes and 12 seconds.  53.9 mGy. PROCEDURE: The procedure, risks, benefits, and alternatives were explained to the patient. Questions regarding the procedure were encouraged and answered. The patient understands and consents to the procedure. A timeout was performed prior to initiating the procedure. The right neck and chest were prepped with chlorhexidine  in a sterile fashion, and a sterile drape was applied covering the operative field. Maximum barrier sterile technique with sterile gowns and gloves were used for the procedure. Local anesthesia was provided with 1% lidocaine . Patency of the right internal jugular vein was confirmed by ultrasound and an  ultrasound image saved and recorded. After creating a small venotomy incision, a 21 gauge needle was advanced into the right internal jugular vein under direct, real-time ultrasound guidance. Ultrasound image documentation was performed. After securing guidewire access, an 8 Fr dilator was placed. A J-wire was kinked to measure appropriate catheter length. A Palindrome tunneled hemodialysis  catheter measuring 23 cm from tip to cuff was chosen for placement. This was tunneled in a retrograde fashion from the chest wall to the venotomy incision. At the venotomy, serial dilatation was performed and a 15 Fr peel-away sheath was placed over a guidewire. The catheter was then placed through the sheath and the sheath removed. Final catheter positioning was confirmed and documented with a fluoroscopic spot image. The catheter was aspirated, flushed with saline, and injected with appropriate volume heparin  dwells. The venotomy incision was closed with subcutaneous and subcuticular 4-0 Vicryl. Dermabond was applied to the incision. The catheter exit site was secured with 0-Prolene retention sutures. COMPLICATIONS: None.  No pneumothorax. FINDINGS: After catheter placement, the tip lies in the right atrium. The catheter aspirates normally and is ready for immediate use. IMPRESSION: Placement of tunneled hemodialysis catheter via the right internal jugular vein. The catheter tip lies in the right atrium. The catheter is ready for immediate use. Electronically Signed   By: Erica Hau M.D.   On: 11/10/2023 12:44   IR US  Guide Vasc Access Right Result Date: 11/10/2023 CLINICAL DATA:  Status post removal of tunneled hemodialysis catheter for bacteremia on 11/07/2023. Now in need of a new tunneled catheter for continued hemodialysis. EXAM: TUNNELED CENTRAL VENOUS HEMODIALYSIS CATHETER PLACEMENT WITH ULTRASOUND AND FLUOROSCOPIC GUIDANCE ANESTHESIA/SEDATION: Moderate (conscious) sedation was employed during this procedure. A total of Versed  2.0 mg and Fentanyl  100 mcg was administered intravenously. Moderate Sedation Time: 27 minutes. The patient's level of consciousness and vital signs were monitored continuously by radiology nursing throughout the procedure under my direct supervision. MEDICATIONS: 1 g IV vancomycin . FLUOROSCOPY: 2 minutes and 12 seconds.  53.9 mGy. PROCEDURE: The procedure, risks, benefits,  and alternatives were explained to the patient. Questions regarding the procedure were encouraged and answered. The patient understands and consents to the procedure. A timeout was performed prior to initiating the procedure. The right neck and chest were prepped with chlorhexidine  in a sterile fashion, and a sterile drape was applied covering the operative field. Maximum barrier sterile technique with sterile gowns and gloves were used for the procedure. Local anesthesia was provided with 1% lidocaine . Patency of the right internal jugular vein was confirmed by ultrasound and an ultrasound image saved and recorded. After creating a small venotomy incision, a 21 gauge needle was advanced into the right internal jugular vein under direct, real-time ultrasound guidance. Ultrasound image documentation was performed. After securing guidewire access, an 8 Fr dilator was placed. A J-wire was kinked to measure appropriate catheter length. A Palindrome tunneled hemodialysis catheter measuring 23 cm from tip to cuff was chosen for placement. This was tunneled in a retrograde fashion from the chest wall to the venotomy incision. At the venotomy, serial dilatation was performed and a 15 Fr peel-away sheath was placed over a guidewire. The catheter was then placed through the sheath and the sheath removed. Final catheter positioning was confirmed and documented with a fluoroscopic spot image. The catheter was aspirated, flushed with saline, and injected with appropriate volume heparin  dwells. The venotomy incision was closed with subcutaneous and subcuticular 4-0 Vicryl. Dermabond was applied to the incision. The catheter exit site was  secured with 0-Prolene retention sutures. COMPLICATIONS: None.  No pneumothorax. FINDINGS: After catheter placement, the tip lies in the right atrium. The catheter aspirates normally and is ready for immediate use. IMPRESSION: Placement of tunneled hemodialysis catheter via the right internal  jugular vein. The catheter tip lies in the right atrium. The catheter is ready for immediate use. Electronically Signed   By: Erica Hau M.D.   On: 11/10/2023 12:44   Medications:  anticoagulant sodium citrate      vancomycin       apixaban   5 mg Oral BID   Chlorhexidine  Gluconate Cloth  6 each Topical Q0600   cinacalcet   30 mg Oral Q M,W,F-HD   digoxin   0.125 mg Oral Q M,W,F   feeding supplement (NEPRO CARB STEADY)  237 mL Oral BID BM   fentaNYL   1 patch Transdermal Q72H   levothyroxine   25 mcg Oral Q0600   midodrine   30 mg Oral TID WC   pregabalin   50 mg Oral Daily   sevelamer  carbonate  1,600 mg Oral TID WC   sodium zirconium cyclosilicate   10 g Oral Once   vancomycin  variable dose per unstable renal function (pharmacist dosing)   Does not apply See admin instructions   venlafaxine  XR  37.5 mg Oral Daily     OP Dialysis Orders: MWF - [facility] 4:30hr, 400/800, EDW 155.5kg, 2K/2Ca, TDC, no heparin  (HIT) - Mircera 200mcg IV q 2 weeks - last 4/4 - Sensipar  30mg  PO HD   Assessment/Plan:  Acute on Chronic Resp Failure: Chronically overloaded.   Sacral Cellulitis/Enterococcus faecalis and MRSE bacteremia : ID recommending line holiday.  On abx per ID.  TDC removed  in IR 11/07/2023. Was replaced 11/10/2023.  ABX per primary. Vancomycin  for 6 weeks ending 12/19/2023.  Mild hyperkalemia: K+ 5.4 Lokelma  10 gram PO again today. HD later today. Follow K+.   ESRD: Normally MWF schedule. He refused HD 11/10/2023. HD today on schedule.   Hypotension/volume: BP low, stable on mido. Severe edema-chronic issue. UF as tolerated.   Anemia: Hgb 11.5 - Next ESA due 11/11/2023 but does not need. Follow HGB.   Metabolic bone disease: Corrected Ca+ upper limit of normal. No VDRA. PO4 at goal. Continue home binders/sensipar . Low albumin -add nepro.  9.   Permanent Access-patient asking for permanent access. This can be done as OP.   Disposition: Stable from renal perspective for DC.   Sudiksha Victor H. Kayliana Codd  NP-C 11/11/2023, 12:12 PM  [employer] [phone]

## 2023-11-11 NOTE — Procedures (Signed)
 Patient wanted goal increased to 6 liters. Advised patient I'd have to ask MD. Notified Dr. Zana Hesselbach, said okay to increase to 6 liters. Attempted to increase to 6 liters but machine only let me increased to 4950 ml. Patient and MD notified. Patient states that he's going to sign off treatment early, when he has 1 hour left. Dr. Zana Hesselbach notified.

## 2023-11-12 ENCOUNTER — Other Ambulatory Visit (HOSPITAL_COMMUNITY): Payer: Self-pay

## 2023-11-12 DIAGNOSIS — B952 Enterococcus as the cause of diseases classified elsewhere: Secondary | ICD-10-CM | POA: Diagnosis not present

## 2023-11-12 DIAGNOSIS — R7881 Bacteremia: Secondary | ICD-10-CM | POA: Diagnosis not present

## 2023-11-12 LAB — CULTURE, BLOOD (ROUTINE X 2)
Culture: NO GROWTH
Culture: NO GROWTH
Special Requests: ADEQUATE
Special Requests: ADEQUATE

## 2023-11-12 LAB — RENAL FUNCTION PANEL
Albumin: 3.1 g/dL — ABNORMAL LOW (ref 3.5–5.0)
Anion gap: 15 (ref 5–15)
BUN: 49 mg/dL — ABNORMAL HIGH (ref 6–20)
CO2: 21 mmol/L — ABNORMAL LOW (ref 22–32)
Calcium: 9.8 mg/dL (ref 8.9–10.3)
Chloride: 97 mmol/L — ABNORMAL LOW (ref 98–111)
Creatinine, Ser: 9.62 mg/dL — ABNORMAL HIGH (ref 0.61–1.24)
GFR, Estimated: 7 mL/min — ABNORMAL LOW (ref >60)
Glucose, Bld: 99 mg/dL (ref 70–99)
Phosphorus: 6.1 mg/dL — ABNORMAL HIGH (ref 2.5–4.6)
Potassium: 4.9 mmol/L (ref 3.5–5.1)
Sodium: 133 mmol/L — ABNORMAL LOW (ref 135–145)

## 2023-11-12 LAB — VANCOMYCIN, RANDOM: Vancomycin Rm: 27 ug/mL

## 2023-11-12 MED ORDER — LEVOTHYROXINE SODIUM 25 MCG PO TABS
25.0000 ug | ORAL_TABLET | Freq: Every day | ORAL | 3 refills | Status: DC
  Filled 2023-11-12: qty 90, 90d supply, fill #0

## 2023-11-12 MED ORDER — FENTANYL 12 MCG/HR TD PT72: 1.0000 | MEDICATED_PATCH | TRANSDERMAL | 0 refills | Status: DC

## 2023-11-12 MED ORDER — SEVELAMER CARBONATE 800 MG PO TABS
1600.0000 mg | ORAL_TABLET | Freq: Three times a day (TID) | ORAL | 0 refills | Status: DC
  Filled 2023-11-12: qty 180, 30d supply, fill #0

## 2023-11-12 MED ORDER — VENLAFAXINE HCL ER 37.5 MG PO CP24
37.5000 mg | ORAL_CAPSULE | Freq: Every day | ORAL | 2 refills | Status: DC
Start: 2023-11-12 — End: 2024-01-29
  Filled 2023-11-12: qty 30, 30d supply, fill #0

## 2023-11-12 MED ORDER — PREGABALIN 50 MG PO CAPS
50.0000 mg | ORAL_CAPSULE | Freq: Every day | ORAL | 1 refills | Status: DC
  Filled 2023-11-12: qty 90, 90d supply, fill #0

## 2023-11-12 MED ORDER — CHLORHEXIDINE GLUCONATE CLOTH 2 % EX PADS: 6.0000 | MEDICATED_PAD | Freq: Every day | CUTANEOUS | Status: DC

## 2023-11-12 MED ORDER — FENTANYL 12 MCG/HR TD PT72
1.0000 | MEDICATED_PATCH | TRANSDERMAL | 0 refills | Status: DC
  Filled 2023-11-12: qty 5, 15d supply, fill #0

## 2023-11-12 MED ORDER — VITAMIN D3 25 MCG PO TABS
500.0000 [IU] | ORAL_TABLET | Freq: Every day | ORAL | 0 refills | Status: DC
  Filled 2023-11-12: qty 30, 60d supply, fill #0

## 2023-11-12 MED ORDER — SODIUM ZIRCONIUM CYCLOSILICATE 10 G PO PACK
10.0000 g | PACK | Freq: Once | ORAL | Status: AC
  Administered 2023-11-12: 10 g via ORAL
  Filled 2023-11-12: qty 1

## 2023-11-12 MED ORDER — DIGOXIN 125 MCG PO TABS
0.1250 mg | ORAL_TABLET | ORAL | 1 refills | Status: DC
  Filled 2023-11-12: qty 30, 70d supply, fill #0

## 2023-11-12 NOTE — Hospital Course (Signed)
 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 Patient received HD with improvement in his hypoxia and edema. Due to his bacteremia, his tunneled dialysis catheter was removed by IR on 4/14 and replaced on 4/17. After multiple sessions of HD patient was at his baseline respiratory status and was discharged to resume his outpatient MWF HD schedule.   Enterococcus faecalis and MRSE bacteremia Blood cultures on admission were positive for E. Faecalis and MRSE. Vancomycin  was initiated and ID consulted. Patient's TDC was removed as this was a likely nidus for infection, followed by a line holiday until it was replaced by IR on 4/17. Blood cultures after TDC removal showed no growth. TTE was negative for valvular vegetations. Though TEE was discussed to definitively rule out endocarditis, patient would be extremely high risk for this procedure. It was therefore determined to forego the TEE and instead treat empirically with 6 weeks of vancomycin  during outpatient HD sessions.   Chronic somatic and neuropathic pain Patient's home regimen of venlafaxine , lyrica , oxycodone  10mg  q6h prn were continued during admission. A 12.5mcg fentanyl  patch was started during this admission, to be continued at discharge.   Anemia of chronic disease Hemoglobin was at patient's baseline with no evidence of acute blood loss  Paroxysmal atrial fibrillation EKG showed persistent Afib, similar to previous. Home Eliquis  5mg  BID and Digoxin  0.125 MWF were continued  Hypothyroidism Continued home levothyroxine   Depression Continued home venlafaxine  37.5mg 

## 2023-11-12 NOTE — Discharge Summary (Addendum)
 Name: Phillip Young MRN: [id number] DOB: Feb 14, 1986 38 y.o. PCP: Carleen Chary, DO  Date of Admission: 11/01/2023  9:14 AM Date of Discharge: 11/12/23 Attending Physician: Dr. Gayl Katos  Discharge Diagnosis: Principal Problem:   Enterococcal bacteremia Active Problems:   Chronic heart failure with reduced ejection fraction (HFrEF, <= 40%) ([name])   Chronic hyponatremia   ESRD (end stage renal disease) on dialysis ([name])   Hypotension   Hypervolemia   Acute on chronic hypoxic respiratory failure ([name])   Atrial fibrillation, chronic ([name])   Adjustment disorder with mixed anxiety and depressed mood   Chronic, continuous use of opioids   Sacral wound   At risk for central line-associated bloodstream infection (CLABSI)   Bacteremia due to methicillin resistant Staphylococcus epidermidis   Pressure injury of skin    Discharge Medications: Allergies as of 11/12/2023       Reactions   Amiodarone  Other (See Comments)   Suspicion for amiodarone  lung/hepatotoxicity   Coreg  [carvedilol ] Shortness Of Breath, Diarrhea   Wheezing    Heparin  Other (See Comments)   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 What changed:  when to take this reasons to take this   apixaban  5 MG Tabs tablet Commonly known as: ELIQUIS  Take 1 tablet (5 mg total) by mouth 2 (two) times daily.   cinacalcet  30 MG tablet Commonly known as: SENSIPAR  Take 1  tablet (30 mg total) by mouth every dialysis.   clindamycin  1 % gel Commonly known as: CLINDAGEL  Apply topically 2 (two) times daily. What changed:  how much to take when to take this   D3-1000 25 MCG (1000 UT) tablet Generic drug: Cholecalciferol  Take 500 Units by mouth daily.   Darbepoetin Alfa  150 MCG/0.3ML Sosy injection Commonly known as: ARANESP  To be given during dialysis by [facility] per Nephrology. Not to be given to patient at SNF.   digoxin  0.125 MG tablet Commonly known as: LANOXIN  Take 1 tablet (0.125 mg total) by mouth 3 (three) times a week. Monday, Wednesday, Friday (dialysis days)   fentaNYL  12 MCG/HR Commonly known as: DURAGESIC  Place 1 patch onto the skin every 3 (three) days. Start taking on: November 14, 2023   levothyroxine  25 MCG tablet Commonly known as: SYNTHROID  Take 1 tablet (25 mcg total) by mouth daily at 6 (six) AM.   midodrine  10 MG tablet Commonly known as: PROAMATINE  Take 3 tablets (30 mg total) by mouth 3 (three) times daily with meals.   naloxone 2 MG/2ML injection Commonly known as: NARCAN Inject 2 mg into the skin as needed.   Oxycodone  HCl 10 MG Tabs Take 1 tablet (10 mg total) by mouth every 6 (six) hours as needed.   pregabalin  50 MG capsule Commonly known as: LYRICA  Take 1 capsule (50 mg total) by mouth daily. Take one capsule daily. What changed: additional instructions  sevelamer  carbonate 800 MG tablet Commonly known as: RENVELA  Take 2 tablets (1,600 mg total) by mouth 3 (three) times daily with meals.   tirzepatide 2.5 MG/0.5ML injection vial Commonly known as: ZEPBOUND Inject 2.5 mg into the skin once a week.   vancomycin  IVPB Inject 1,000 mg into the vein every Monday, Wednesday, and Friday with hemodialysis. Indication:  MRSE + E faecalis bacteremia Last Day of Therapy:  12/18/23 Labs - weekly w/ HD:  CBC/D, BMP, and vancomycin  trough; ESR and CRP Method of administration:Elastomeric or per HD  protocol - to be given at the HD center   venlafaxine  XR 37.5 MG 24 hr capsule Commonly known as: Effexor  XR Take 1 capsule (37.5 mg total) by mouth daily.               Home Infusion Instuctions  (From admission, onward)           Start     Ordered   11/08/23 0000  Home infusion instructions       Comments: To be given at HD center  Question:  Instructions  Answer:  Flushing of vascular access device: 0.9% NaCl pre/post medication administration and prn patency; Heparin  100 u/ml, 5ml for implanted ports and Heparin  10u/ml, 5ml for all other central venous catheters.   11/08/23 1540              Discharge Care Instructions  (From admission, onward)           Start     Ordered   11/12/23 0000  Discharge wound care:       Comments: Pressure Injury 10/16/23 Buttocks Right;Lower Stage 2 -  Partial thickness loss of dermis presenting as a shallow open injury with a red, pink wound bed without slough. 27 days    Pressure Injury 11/01/23 Thigh Posterior;Right;Left Stage 2 -  Partial thickness loss of dermis presenting as a shallow open injury with a red, pink wound bed without slough. serous blister 10 days   11/12/23 1229            Disposition and follow-up:   Mr.Rieley Kawamoto was discharged from [facility] in Stable condition.  At the hospital follow up visit please address:  1.  Follow-up:  Acute on chronic hypoxic respiratory failure: Check if patient is at/near his baseline respiratory status  Bacteremia: Patient will require vancomycin  with HD sessions, end date May 25.  Evaluate for signs of worsening infection, ensure he is aware of ID follow-up appointment on 6/3  Chronic pain: 12.5mg  fentanyl  patch q72h started on discharge. TOC unfortunately does not stock fentanyl  patches so this was ordered to his outpatient pharmacy. Cost of medications has been a barrier  2.  Labs / imaging needed at time of follow-up: None  3.  Pending  labs/ test needing follow-up: None  4.  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 Patient received HD with improvement in his hypoxia and edema. Due to his bacteremia, his tunneled dialysis catheter was removed by IR on 4/14 and replaced on 4/17. After  multiple sessions of HD patient was at his baseline respiratory status and was discharged to resume his outpatient MWF HD schedule.   Enterococcus faecalis and MRSE bacteremia Blood cultures on admission were positive for E. Faecalis and MRSE. Vancomycin  was initiated and ID consulted. Patient's TDC was removed as this was a likely nidus for infection, followed by a line holiday until it was replaced by IR on 4/17. Blood cultures after TDC removal showed no growth. TTE was negative for valvular vegetations. Though TEE was discussed to definitively rule out endocarditis, patient would be extremely high risk for this procedure. It was therefore determined to forego the TEE and instead treat empirically with 6 weeks of vancomycin  during outpatient HD sessions.   Chronic somatic and neuropathic pain Patient's home regimen of venlafaxine , lyrica , oxycodone  10mg  q6h prn were continued during admission. A 12.5mcg fentanyl  patch was started during this admission, to be continued at discharge.   Anemia of chronic disease Hemoglobin was at patient's baseline with no evidence of acute blood loss  Paroxysmal atrial fibrillation EKG showed persistent Afib, similar to previous. Home Eliquis  5mg  BID and Digoxin   0.125 MWF were continued  Hypothyroidism Continued home levothyroxine   Depression Continued home venlafaxine  37.5mg    Discharge Subjective: Patient was unable to finish his HD session yesterday due to hand cramping. Today he endorses an short-lived headache that spontaneously resolved. He would like to have HD again today, though is willing to be discharged if this is not possible.   Later updated patient he would not be able to receive HD today due to scheduling issues. He can be discharged today to resume his outpatient HD schedule on Monday. Patient was amenable to the plan.   Discharge Exam:   BP (!) 129/119 (BP Location: Right Wrist)   Pulse (!) 103   Temp 98.3 F (36.8 C)   Resp 19   Ht 6' (1.829 m)   Wt (!) 183.8 kg   SpO2 99%   BMI 54.96 kg/m  Const: chronically ill appearing. Fatigued, soft voice. No acute distress Neuro: A&O x 3 Skin: TDC clean and dry, nontender to palpation  Pertinent Labs, Studies, and Procedures:     Latest Ref Rng & Units 11/11/2023   12:43 PM 11/08/2023    5:00 AM 11/06/2023    7:55 PM  CBC  WBC 4.0 - 10.5 K/uL 8.8  8.1  7.6   Hemoglobin 13.0 - 17.0 g/dL 16.1  09.6  04.5   Hematocrit 39.0 - 52.0 % 32.8  34.8  33.8   Platelets 150 - 400 K/uL 201  218  235        Latest Ref Rng & Units 11/12/2023    5:43 AM 11/11/2023   12:43 PM 11/11/2023    5:20 AM  CMP  Glucose 70 - 99 mg/dL 99  409  811   BUN 6 - 20 mg/dL 49  57  54   Creatinine 0.61 - 1.24 mg/dL 9.14  78.29  56.21   Sodium 135 - 145 mmol/L 133  135  133   Potassium 3.5 - 5.1 mmol/L 4.9  5.6  5.4   Chloride 98 - 111 mmol/L 97  97  96   CO2 22 - 32 mmol/L 21  21  20    Calcium  8.9 - 10.3 mg/dL 9.8  9.8  9.8     CT FEMUR RIGHT WO CONTRAST Result Date: 11/01/2023 CLINICAL DATA:  Cellulitis. Dialysis patient Monday Wednesday Friday. Missed yesterday. Vomited last night. History of  chronic sacral wounds. EXAM: CT OF THE LOWER RIGHT EXTREMITY WITHOUT CONTRAST TECHNIQUE:  Multidetector CT imaging of the right lower extremity was performed according to the standard protocol. RADIATION DOSE REDUCTION: This exam was performed according to the departmental dose-optimization program which includes automated exposure control, adjustment of the mA and/or kV according to patient size and/or use of iterative reconstruction technique. COMPARISON:  CT pelvis 07/01/2023 FINDINGS: Bones/Joint/Cartilage There is decreased bony detail due to patient large body habitus and photon starvation artifact. Mild lateral right knee patellofemoral joint space narrowing. Moderate superior right femoroacetabular joint space narrowing. No acute fracture is seen. No cortical erosion is identified. Ligaments Suboptimally assessed by CT. Muscles and Tendons No gross abnormality is seen in size or density of the regional musculature. No gross tendon tear is seen. Soft tissues Large body habitus. Diffuse moderate subcutaneous fat edema and swelling throughout the right thigh and visualized portion of the medial left thigh. There is moderate skin thickening diffusely. No definite walled-off abscess is seen, within the limitations of the photon starvation and artifact. No knee joint effusion is seen. There are multiple prominent right inguinal lymph nodes, measuring up to 10 mm in short axis. These are presumably reactive. IMPRESSION: 1. Diffuse moderate subcutaneous fat edema and swelling throughout the right thigh and visualized portion of the medial left thigh. There is moderate skin thickening diffusely. No definite walled-off abscess is seen, within the limitations of large body habitus and associated photon starvation/artifact. 2. No acute fracture is seen. No cortical erosion is identified. 3. Multiple prominent right inguinal lymph nodes, measuring up to 10 mm in short axis. These are presumably reactive. Electronically Signed   By: Bertina Broccoli M.D.   On: 11/01/2023 16:56   CT PELVIS WO CONTRAST Result  Date: 11/01/2023 CLINICAL DATA:  Vomiting.  Cellulitis. EXAM: CT PELVIS WITHOUT CONTRAST TECHNIQUE: Multidetector CT imaging of the pelvis was performed following the standard protocol without intravenous contrast. RADIATION DOSE REDUCTION: This exam was performed according to the departmental dose-optimization program which includes automated exposure control, adjustment of the mA and/or kV according to patient size and/or use of iterative reconstruction technique. COMPARISON:  July 01, 2023. FINDINGS: Urinary Tract:  Urinary bladder is decompressed. Bowel:  Unremarkable visualized pelvic bowel loops. Vascular/Lymphatic: No pathologically enlarged lymph nodes. No significant vascular abnormality seen. Reproductive:  Prostate is unremarkable. Other: No ascites or hernia is noted. Stable irregular densities are seen in the subcutaneous tissues posterior to the sacrum which may represent scarring, or possibly cellulitis. Definite fluid collection or abscess is not identified. Musculoskeletal: No suspicious bone lesions identified. IMPRESSION: Stable irregular densities are noted in the subcutaneous tissues posterior to the sacrum which may represent cellulitis or scarring from prior inflammation or infection. No definite fluid collection or abscess is noted. Electronically Signed   By: Rosalene Colon M.D.   On: 11/01/2023 13:19   DG Chest Port 1 View Result Date: 11/01/2023 CLINICAL DATA:  Shortness of breath EXAM: PORTABLE CHEST 1 VIEW COMPARISON:  10/10/2023 FINDINGS: Right dialysis catheter remains in place, unchanged. Cardiomegaly. No confluent airspace opacities, effusions or edema. No acute bony abnormality. IMPRESSION: Cardiomegaly.  No active disease. Electronically Signed   By: Janeece Mechanic M.D.   On: 11/01/2023 10:23      Signed: [name], MD PGY-1 11/12/2023, 12:29 PM   Pager: [phone]

## 2023-11-12 NOTE — Progress Notes (Signed)
   11/11/23 1947  Assess: MEWS Score  Temp 98.2 F (36.8 C)  BP 107/72  MAP (mmHg) 80  Pulse Rate (!) 111  Resp 20  SpO2 96 %  O2 Device Nasal Cannula  Heater temperature 42.8 F (6 C)  Assess: MEWS Score  MEWS Temp 0  MEWS Systolic 0  MEWS Pulse 2  MEWS RR 0  MEWS LOC 0  MEWS Score 2  MEWS Score Color Yellow  Assess: if the MEWS score is Yellow or Red  Were vital signs accurate and taken at a resting state? Yes  Does the patient meet 2 or more of the SIRS criteria? No  MEWS guidelines implemented  Yes, yellow  Treat  MEWS Interventions Considered administering scheduled or prn medications/treatments as ordered  Take Vital Signs  Increase Vital Sign Frequency  Yellow: Q2hr x1, continue Q4hrs until patient remains green for 12hrs  Escalate  MEWS: Escalate Yellow: Discuss with charge nurse and consider notifying provider and/or RRT  Notify: Charge Nurse/RN  Name of Charge Nurse/RN Notified Nana Avers, RN  Provider Notification  Provider Name/Title Alida Ape, MD  Date Provider Notified 11/11/23  Time Provider Notified 2000  Method of Notification Page  Notification Reason Other (Comment) (Yellow MEWS)  Provider response No new orders  Date of Provider Response 11/11/23  Time of Provider Response 2012  Assess: SIRS CRITERIA  SIRS Temperature  0  SIRS Respirations  0  SIRS Pulse 1  SIRS WBC 0  SIRS Score Sum  1

## 2023-11-12 NOTE — Progress Notes (Signed)
                  Subjective:   Summary: Phillip Young is a 38 yo male with chronic hypoxic respiratory failure on 2-4L home O2, HFrEF w/LVEF <20%, Afib on Eliquis , chronic pain, ESRD on HD MWF who presented to the ED with shortness of breath. He is being treated for volume overload due to missed HD session as well as E faecalis and MRSE bacteremia  Patient was unable to finish his HD session yesterday due to hand cramping. Today he endorses an short-lived headache that spontaneously resolved. He would like to have HD again today, though is willing to be discharged if this is not possible.  Objective:  Vital signs in last 24 hours: Vitals:   11/11/23 2251 11/12/23 0101 11/12/23 0430 11/12/23 0824  BP: 122/85 115/83 108/70 (!) 129/119  Pulse: 93 60 99 (!) 103  Resp: 20 (!) 21 20 19   Temp: 98 F (36.7 C) (!) 97.4 F (36.3 C) 97.9 F (36.6 C) 98.3 F (36.8 C)  TempSrc: Oral Oral    SpO2: 99% 98% 97% 99%  Weight:      Height:       Supplemental O2: Nasal Cannula SpO2: 99 % O2 Flow Rate (L/min): 6 L/min FiO2 (%): 21 %  Physical Exam:  Const: chronically ill appearing. Fatigued, soft voice. No acute distress Neuro: A&O x 3 Skin: TDC clean and dry, nontender to palpation  Assessment/Plan:   38 yo M with severe HFrEF, ESRD, admitted for volume overload 2/2 missing HD as well as E faecalis and MRSE bacteremia. Patient has been responding well to multiple sessions of HD and is medically stable as far as his bacteremia. He will receive another session of HD today, then be discharged tomorrow if no new issues arise.   Enterococcus faecalis and MRSE bacteremia Most recent blood cultures NGTD. Patient remains afebrile, HDS, no leukocytosis.   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 Near his baseline of 2-4L [place] chronically.  - HD planned for this afternoon. Plan for discharge home tomorrow.   Chronic somatic and neuropathic pain Fentanyl  patch as well as multimodal pain regimen has been helping significantly with his chronic pain - Fentanyl  patch 12 mcg/hr q72hr  - Continue venlafaxine  37.5mg  daily, lyrica  50mg  daily, oxycodone  10mg  q6h prn, Sarna lotion  Anemia of chronic disease: Hgb stable, no evidence of acute blood loss Paroxysmal atrial fibrillation: Continue home Eliquis  5mg  BID, Digoxin  0.125mg  MWF Hypothyroidism: Continue home levothyroxine  25mcg Depression: Continue home venlafaxine  37.5mg   Diet: Renal VTE: Eliquis  Code: Full  Dispo: Anticipated discharge to Home pending medical stability.   [name], MD Internal Medicine Resident, PGY-1 Please contact the on call pager at [phone] for any urgent or emergent needs. 11:39 AM 11/12/2023

## 2023-11-12 NOTE — TOC Transition Note (Signed)
 Transition of Care [facility]) - Discharge Note   Patient Details  Name: Phillip Young MRN: [id number] Date of Birth: [birth year]  [facility] CM/SW Contact:  Jannine Meo, RN Phone Number: 11/12/2023, 1:25 PM   Clinical Narrative:   Patient is being discharged today. Spoke with mom of patient, reports that patient has been nonambulatory for at least a year. Mom takes patient to dialysis by private vehicle due to medicaid changed to [facility] planning only and does not cover transportation. Per mom patient has home O2. [facility] arranged for transport home.    Final next level of care: Home/Self Care Barriers to Discharge: No Barriers Identified   Patient Goals and CMS Choice Patient states their goals for this hospitalization and ongoing recovery are:: to return home          Discharge Placement                       Discharge Plan and Services Additional resources added to the After Visit Summary for                                       Social Drivers of Health (SDOH) Interventions SDOH Screenings   Food Insecurity: Food Insecurity Present (11/01/2023)  Housing: High Risk (11/01/2023)  Transportation Needs: Unmet Transportation Needs (11/01/2023)  Utilities: At Risk (11/01/2023)  Alcohol  Screen: Low Risk  (10/06/2023)  Depression (PHQ2-9): High Risk (10/06/2023)  Financial Resource Strain: Low Risk  (10/06/2023)  Physical Activity: Insufficiently Active (10/06/2023)  Social Connections: Socially Isolated (10/06/2023)  Stress: No Stress Concern Present (10/06/2023)  Tobacco Use: Medium Risk (11/01/2023)  Health Literacy: Adequate Health Literacy (10/06/2023)     Readmission Risk Interventions    10/13/2023   11:27 AM 09/23/2023    5:05 PM 07/05/2023    4:25 PM  Readmission Risk Prevention Plan  Transportation Screening Complete Complete Complete  HRI or Home Care Consult   Complete  Social Work Consult for Recovery Care Planning/Counseling   Complete   Palliative Care Screening   Not Applicable  Medication Review Oceanographer) Referral to Pharmacy Referral to Pharmacy Complete  PCP or Specialist appointment within 3-5 days of discharge  Complete   HRI or Home Care Consult Complete Complete   SW Recovery Care/Counseling Consult Complete Complete   Palliative Care Screening Not Applicable Not Applicable   Skilled Nursing Facility Not Applicable Not Applicable

## 2023-11-12 NOTE — Progress Notes (Signed)
 [facility] Progress Note   Subjective: TDC placed 11/10/2023 but patient refused to go to HD. D/C today. We were unable to get him on the HD schedule for tonight. He will show up for his regular HD time at [facility] on 11/14/23 and confirmed with his mother that he would be there. He wants permanent access placed but this can be done as OP. K+ 4.9. Give lokelma  today before d/c Objective Vitals:   11/11/23 2251 11/12/23 0101 11/12/23 0430 11/12/23 0824  BP: 122/85 115/83 108/70 (!) 129/119  Pulse: 93 60 99 (!) 103  Resp: 20 (!) 21 20 19   Temp: 98 F (36.7 C) (!) 97.4 F (36.3 C) 97.9 F (36.6 C) 98.3 F (36.8 C)  TempSrc: Oral Oral    SpO2: 99% 98% 97% 99%  Weight:      Height:       Physical Exam General: obese male in NAD Heart: RRR, no murmurs, rubs or gallops Lungs: CTA anteriorly, respirations unlabored. Decreased lung sounds right lower lobe.  Abdomen: Soft, +BS Extremities: 2+ pitting edema b/l lower extremities, painful to the touch Dialysis Access: RIJ TDC Drsg intact.    Additional Objective Labs: Basic Metabolic Panel: Recent Labs  Lab 11/11/23 0520 11/11/23 1243 11/12/23 0543  NA 133* 135 133*  K 5.4* 5.6* 4.9  CL 96* 97* 97*  CO2 20* 21* 21*  GLUCOSE 107* 117* 99  BUN 54* 57* 49*  CREATININE 10.76* 11.16* 9.62*  CALCIUM  9.8 9.8 9.8  PHOS 7.0* 6.9* 6.1*   Liver Function Tests: Recent Labs  Lab 11/11/23 0520 11/11/23 1243 11/12/23 0543  ALBUMIN  3.1* 3.1* 3.1*   No results for input(s): "LIPASE", "AMYLASE" in the last 168 hours. CBC: Recent Labs  Lab 11/06/23 1955 11/08/23 0500 11/11/23 1243  WBC 7.6 8.1 8.8  HGB 10.9* 11.1* 10.4*  HCT 33.8* 34.8* 32.8*  MCV 101.2* 103.0* 101.5*  PLT 235 218 201   Blood Culture    Component Value Date/Time   SDES BLOOD RIGHT HAND 11/07/2023 1953   SPECREQUEST  11/07/2023 1953    BOTTLES DRAWN AEROBIC AND ANAEROBIC Blood Culture adequate volume   CULT  11/07/2023 1953    NO GROWTH 5  DAYS Performed at [facility], [address] [place], [place] [address]    REPTSTATUS 11/12/2023 FINAL 11/07/2023 1953    Cardiac Enzymes: No results for input(s): "CKTOTAL", "CKMB", "CKMBINDEX", "TROPONINI" in the last 168 hours. CBG: No results for input(s): "GLUCAP" in the last 168 hours. Iron Studies: No results for input(s): "IRON", "TIBC", "TRANSFERRIN", "FERRITIN" in the last 72 hours. @lablastinr3 @ Studies/Results: No results found.  Medications:  vancomycin       apixaban   5 mg Oral BID   Chlorhexidine  Gluconate Cloth  6 each Topical Q0600   cinacalcet   30 mg Oral Q M,W,F-HD   digoxin   0.125 mg Oral Q M,W,F   feeding supplement (NEPRO CARB STEADY)  237 mL Oral BID BM   fentaNYL   1 patch Transdermal Q72H   levothyroxine   25 mcg Oral Q0600   midodrine   30 mg Oral TID WC   pregabalin   50 mg Oral Daily   sevelamer  carbonate  1,600 mg Oral TID WC   sodium zirconium cyclosilicate   10 g Oral Once   sodium zirconium cyclosilicate   10 g Oral Once   vancomycin  variable dose per unstable renal function (pharmacist dosing)   Does not apply See admin instructions   venlafaxine  XR  37.5 mg Oral Daily  OP Dialysis Orders: MWF - [facility] 4:30hr, 400/800, EDW 155.5kg, 2K/2Ca, TDC, no heparin  (HIT) - Mircera 200mcg IV q 2 weeks - last 4/4 - Sensipar  30mg  PO HD   Assessment/Plan:  Acute on Chronic Resp Failure: Chronically overloaded.   Sacral Cellulitis/Enterococcus faecalis and MRSE bacteremia : ID recommending line holiday.  On abx per ID.  TDC removed  in IR 11/07/2023. Was replaced 11/10/2023.  ABX per primary. Vancomycin  for 6 weeks ending 12/19/2023.  Mild hyperkalemia: K+ 4.9 Lokelma  10 gram PO on 11/11/23 and 1 dose ordered before d/c today. HD scheduled Monday 11/14/23. Follow K+.   ESRD: Normally MWF schedule. He refused HD 11/10/2023.   Hypotension/volume: BP low, stable on mido. Severe edema-chronic issue. UF as tolerated.   Anemia: Hgb 11.5 - Next ESA due  11/11/2023 but does not need. Follow HGB.   Metabolic bone disease: Corrected Ca+ upper limit of normal. No VDRA. PO4 at goal. Continue home binders/sensipar . Low albumin -add nepro.  9.   Permanent Access-patient asking for permanent access. This can be done as OP.   Disposition: Stable from renal perspective for DC.   Hersey Lorenzo, NP-C 11/12/2023, 2:46 PM  [employer] [phone]

## 2023-11-13 NOTE — Discharge Planning (Signed)
 [facility] Patient Discharge Orders - [facility] CLINIC: [facility]  Patient's name: Phillip Young Admit/DC Dates: 11/01/2023 - 11/12/2023  DISCHARGE DIAGNOSES: Enterococcal bacteremia   Chronic heart failure with reduced EF ESRD on HD Acute on chronic hypoxic respiratory failure  HD ORDER CHANGES: Heparin  change: No heparin  (HIT) EDW Change: yes New EDW: 180 kg [place] Change: no  ANEMIA MANAGEMENT: Aranesp : Given: n/a   ESA dose for discharge: mircera 200 mcg. Last dose 10/28/23 - Due at next HD IV Iron dose at discharge: per protocol Transfusion: Given: n/a  BONE/MINERAL MEDICATIONS: Hectorol /Calcitriol change: n/a Sensipar /Parsabiv change: 30 mg q dialysis  ACCESS INTERVENTION/CHANGE: n/a Details:   RECENT LABS: Recent Labs  Lab 11/11/23 1243 11/12/23 0543  HGB 10.4*  --   NA 135 133*  K 5.6* 4.9  CALCIUM  9.8 9.8  PHOS 6.9* 6.1*  ALBUMIN  3.1* 3.1*    IV ANTIBIOTICS: yes Details: IV Vancomycin  1,000 mg every MWF with HD ending on 12/19/23  OTHER ANTICOAGULATION: On Coumadin?: n/a   OTHER/APPTS/LAB ORDERS: Please draw monthly labs on patient in HD.   D/C Meds to be reconciled by nurse after every discharge.  Completed By: Hersey Lorenzo, NP-C   Reviewed by: MD:______ RN_______

## 2023-11-14 MED ORDER — OXYCODONE HCL 10 MG PO TABS
10.0000 mg | ORAL_TABLET | Freq: Four times a day (QID) | ORAL | 0 refills | Status: DC | PRN
Start: 2023-11-14 — End: 2023-11-22

## 2023-11-14 NOTE — Progress Notes (Signed)
 Late Note Entry- November 14, 2023  Pt was d/c on Saturday. Contacted [facility] this morning to be advised of pt's d/c date and that pt should resume care today.   Lauraine Polite Renal Navigator [phone]

## 2023-11-18 ENCOUNTER — Telehealth: Payer: Self-pay | Admitting: *Deleted

## 2023-11-18 NOTE — Telephone Encounter (Signed)
 Copied from CRM [id number]. Topic: Clinical - Medical Advice >> Nov 18, 2023 11:07 AM Tisa Forester wrote: Reason for CRM: patient request for his Provider Carleen Chary to sign, need something on file  so patient can keep his   Oxyen concentrater machine and the Cpap machine  please reachout to patient [phone]

## 2023-11-19 ENCOUNTER — Telehealth: Payer: Self-pay | Admitting: Student

## 2023-11-19 NOTE — Telephone Encounter (Signed)
 Received after hours page. Returned call to patient and confirmed DOB. Patient requests refill for oxycodone  10 mg Q6H PRN. He states he took the last tablet today. Last fill per PDMP was 4/21 for 30 tablets but also noted prior fill in early April. Discussed with patient that it is too early for refill at this time. After discussion, patient was able to locate bottle for Oxycodone  with tablets remaining. Also, patient states unable to get fentanyl  patches at pharmacy, states out of stock. Called [facility], they state fentanyl  patches available but patient will have to pay out of pocket for them, notified patient about this. Patient verbalizes understanding and all questions addressed. Encouraged patient to reschedule missed [facility] appt.

## 2023-11-22 ENCOUNTER — Telehealth: Payer: Self-pay | Admitting: Student

## 2023-11-22 ENCOUNTER — Ambulatory Visit: Payer: Medicare Other | Attending: Cardiology | Admitting: Cardiology

## 2023-11-22 ENCOUNTER — Other Ambulatory Visit: Payer: Self-pay | Admitting: Student

## 2023-11-22 NOTE — Telephone Encounter (Signed)
 A request has been sent to the pt's provider to place a new Referral.  Copied from CRM [id number]. Topic: Referral - Request for Referral >> Nov 22, 2023 10:55 AM Eleanore Grey wrote: [name] [name] called, she was told another referral for the patient's speech therapy needs to be sent again since patient was in the hospital when initial referral was sent. Referral needs to be resent to [facility], [address], [place], [place] [address]

## 2023-11-22 NOTE — Telephone Encounter (Signed)
 [name] mother's below is requesting a New Referral be placed.  Per [facility] they closed the prev referral because the referral was placed during a hospitalization.       Type Date User Summary Attachment  General 10/24/2023  4:28 PM Sherlene Diss, Ellen Guppy PATIENT CURRENTLY IN HOSPITAL -  Note: PATIENT CURRENTLY IN HOSPITAL . Type Date User Summary Attachment  General 10/24/2023  4:28 PM Arlis Bent J DISREGARD PREVIOUS MESSAGE -  Note: DISREGARD PREVIOUS MESSAGE . Type Date User Summary Attachment  General 10/24/2023  4:26 PM Arlis Bent J Duplicate request - see referral# [id number] -  Note: Duplicate request - see referral# [id number]   . Type Date User Summary Attachment  General 10/13/2023  2:55 PM [facility] -  Note: [facility]   Rehabilitation center in [place], [place]  Address: [address], [place], [place] [address] Phone: [phone] Referral Order             Source  Phillip, Young (Patient)   Subject  Phillip Young (Patient)   Topic  Referral - Request for Referral    Communication  [name] [name] called, she was told another referral for the patient's speech therapy needs to be sent again since patient was in the hospital when initial referral was sent. Referral needs to be resent to [facility], [address], [place], [place] [address]

## 2023-11-22 NOTE — Telephone Encounter (Unsigned)
 Copied from CRM [id number]. Topic: Clinical - Medication Refill >> Nov 22, 2023  9:58 AM Corin V wrote: Most Recent Primary Care Visit:  Provider: Lawson Prey  Department: [facility]  Visit Type: MEDICARE AWV, INITIAL  Date: 10/06/2023  Medication: Oxycodone  HCl 10 MG TABS  Has the patient contacted their pharmacy? Yes (Agent: If no, request that the patient contact the pharmacy for the refill. If patient does not wish to contact the pharmacy document the reason why and proceed with request.) (Agent: If yes, when and what did the pharmacy advise?)  Is this the correct pharmacy for this prescription? Yes If no, delete pharmacy and type the correct one.  This is the patient's preferred pharmacy:  [id number] - [place], [place] - [address] AT [facility] [address] [place]  [address] Phone: [phone] Fax: [phone]  [facility] [address] [place] [address] Phone: [phone] Fax: [phone]    Has the prescription been filled recently? Yes  Is the patient out of the medication? Yes  Has the patient been seen for an appointment in the last year OR does the patient have an upcoming appointment? Yes  Can we respond through MyChart? No  Agent: Please be advised that Rx refills may take up to 3 business days. We ask that you follow-up with your pharmacy.

## 2023-11-23 ENCOUNTER — Other Ambulatory Visit: Payer: Self-pay | Admitting: Student

## 2023-11-23 DIAGNOSIS — R490 Dysphonia: Secondary | ICD-10-CM

## 2023-11-23 MED ORDER — OXYCODONE HCL 10 MG PO TABS: 10.0000 mg | ORAL_TABLET | Freq: Four times a day (QID) | ORAL | 0 refills | Status: DC | PRN

## 2023-11-29 NOTE — Telephone Encounter (Signed)
 4 attempt to get orders:  Spoke with [employer] Rep Ivanna who states she will be faxing over an order to be completed.

## 2023-11-29 NOTE — Telephone Encounter (Addendum)
 Just spoke with [employer] again for the 3 time today.  We have not seen any orders about any type of DME devices for this patient. Nor have we rec'd  request for OV notes.    Advised [employer] that information Requested will need to be sent before we can assist with this patient as he has not been seen in [facility] since   Name: Phillip Young, Phillip Young MRN: [id number]  Date: 10/06/2023 Status: Comp  Time: 10:45 AM Length: 30  Visit Type: OPEN ESTABLISHED [726] Copay: $0.00  Provider: Carleen Chary, DO          Copied from CRM [id number]. Topic: Clinical - Medical Advice >> Nov 18, 2023 11:07 AM Tisa Forester wrote: Reason for CRM: patient request for his Provider Carleen Chary to sign, need something on file  so patient can keep his   Oxyen concentrater machine and the Cpap machine  please reachout to patient [phone] >> Nov 29, 2023 11:37 AM Corin V wrote: Tiara with [employer] called and stated that if updated orders and clinicals outlining medical necessity for the home ventilation and oxygen  equipment are not received today they are scheduled to be picked up by end of day due to no paperwork being received. Please send over documentation as soon as possible and verify receipt to ensure equipment is not removed if still needed. Fax: [phone], Phone: [phone]  >> Nov 25, 2023  1:09 PM Madelyne Schiff wrote: Ms. Richeson (mother) calling back, stating that [facility] called and stated that if they do not receive a referral or Script or document stating the need for the Bipap and Oxygen  concentrator the will be coming to pick it up on Tuesday  [employer] tech# [phone] [employer] office# [phone]

## 2023-11-30 ENCOUNTER — Other Ambulatory Visit: Payer: Self-pay | Admitting: Student

## 2023-11-30 ENCOUNTER — Telehealth: Payer: Self-pay | Admitting: Student

## 2023-11-30 NOTE — Telephone Encounter (Unsigned)
 Copied from CRM [id number]. Topic: Clinical - Medication Refill >> Nov 30, 2023 10:20 AM Blair Bumpers wrote: Medication: Oxycodone  HCl 10 MG TABS  Has the patient contacted their pharmacy? No (Agent: If no, request that the patient contact the pharmacy for the refill. If patient does not wish to contact the pharmacy document the reason why and proceed with request.) (Agent: If yes, when and what did the pharmacy advise?)  This is the patient's preferred pharmacy:  [id number] - [place], [place] - [address] AT [facility] [address] [place] [address] Phone: [phone] Fax: [phone]   Is this the correct pharmacy for this prescription? Yes If no, delete pharmacy and type the correct one.   Has the prescription been filled recently? Yes  Is the patient out of the medication? No but patient wanted to put in a 48 hour notice for refill.   Has the patient been seen for an appointment in the last year OR does the patient have an upcoming appointment? Yes  Can we respond through MyChart? Yes  Agent: Please be advised that Rx refills may take up to 3 business days. We ask that you follow-up with your pharmacy.

## 2023-11-30 NOTE — Telephone Encounter (Signed)
 Last Fill: 11/23/23 30 tabs/0 RF  Last OV: 10/06/23 Next OV: 12/06/23  Routing to provider for review/authorization.

## 2023-11-30 NOTE — Telephone Encounter (Signed)
 Spoke with [employer] and the [facility] office did not order the requested supplies. [employer] has now sent the necessary DME paperwork to our office to be completed and explained that all of the calls on 11/18/2023 11/25/2023  and 11/29/2023/  were for the provider who signed for the order  previously.  Spoke with the patient and explained the we never rec'd the DME request.  Pt verbalized understanding and now also understanding no paperwork will be filled out until he has been seen.  Pt is now Sch for the following:   Name: Phillip Young, Phillip Young MRN: [id number]  Date: 12/06/2023 Status: Sch  Time: 10:15 AM Length: 45  Visit Type: OPEN ESTABLISHED [726] Copay: $0.00  Provider: Marni Sins, MD        Copied from CRM [id number]. Topic: Clinical - Medical Advice >> Nov 18, 2023 11:07 AM Tisa Forester wrote: Reason for CRM: patient request for his Provider Carleen Chary to sign, need something on file  so patient can keep his   Oxyen concentrater machine and the Cpap machine  please reachout to patient [phone] >> Nov 29, 2023 11:37 AM Corin V wrote: Tiara with [employer] called and stated that if updated orders and clinicals outlining medical necessity for the home ventilation and oxygen  equipment are not received today they are scheduled to be picked up by end of day due to no paperwork being received. Please send over documentation as soon as possible and verify receipt to ensure equipment is not removed if still needed. Fax: [phone], Phone: [phone]  >> Nov 25, 2023  1:09 PM Madelyne Schiff wrote: Ms. Zarr (mother) calling back, stating that [facility] called and stated that if they do not receive a referral or Script or document stating the need for the Bipap and Oxygen  concentrator the will be coming to pick it up on Tuesday  [employer] tech# [phone] [employer] office# [phone]

## 2023-11-30 NOTE — Telephone Encounter (Signed)
 Copied from CRM [id number]. Topic: Clinical - Medication Refill >> Nov 30, 2023 10:14 AM Blair Bumpers wrote: Medication: Oxycodone  HCl 10 MG TABS  Has the patient contacted their pharmacy? No (Agent: If no, request that the patient contact the pharmacy for the refill. If patient does not wish to contact the pharmacy document the reason why and proceed with request.) (Agent: If yes, when and what did the pharmacy advise?)  This is the patient's preferred pharmacy:  [id number] - [place], [place] - [address] AT [facility] [address] [place]  [address] Phone: [phone] Fax: [phone]  Is this the correct pharmacy for this prescription? Yes If no, delete pharmacy and type the correct one.   Has the prescription been filled recently? Yes  Is the patient out of the medication? No but wanted to place a 48 hour notice for refill.   Has the patient been seen for an appointment in the last year OR does the patient have an upcoming appointment? Yes  Can we respond through MyChart? Yes  Agent: Please be advised that Rx refills may take up to 3 business days. We ask that you follow-up with your pharmacy.

## 2023-11-30 NOTE — Telephone Encounter (Signed)
Thanks Chilon! 

## 2023-12-02 MED ORDER — OXYCODONE HCL 10 MG PO TABS: 10.0000 mg | ORAL_TABLET | Freq: Four times a day (QID) | ORAL | 0 refills | Status: DC | PRN

## 2023-12-05 ENCOUNTER — Telehealth: Payer: Self-pay | Admitting: *Deleted

## 2023-12-05 NOTE — Telephone Encounter (Signed)
 RTC to [place].  Message left that the Clinics had returned her call.   Will forward to PCP. Copied from CRM [id number]. Topic: Clinical - Medical Advice >> Dec 05, 2023  1:30 PM Tisa Forester wrote: Reason for CRM: [place] [facility] patient sunday  , patient refuse to go to Emergency room per [place]  bilateral swelling , both legs really swollen , lungs clear , 22 is repiration , oxgyen 96  okay , no chest pain , when patient stand up his heart rate it will go up ,  was recommended to go to the emergency room  nurse stated concerning , was told if get chest pain to go to ER  patient requesting to get dialysis 5 days week reason he whole a lot of fluid  [phone] is [place] nurse call back number   Please call [place]

## 2023-12-05 NOTE — Telephone Encounter (Signed)
 Call from patient extremely short of breath today.  Advised patient to go to the ER on today.  Patient states has an appointment for wheelchair evaluation on tomorrow.  Advised patient that due to his shortness of breath we will reschedule his appointment.  Call to [facility] who will reschedule patient's appointment.

## 2023-12-06 ENCOUNTER — Encounter: Admitting: Student

## 2023-12-06 ENCOUNTER — Encounter (HOSPITAL_COMMUNITY): Payer: Self-pay

## 2023-12-06 ENCOUNTER — Inpatient Hospital Stay (HOSPITAL_COMMUNITY)
Admission: EM | Admit: 2023-12-06 | Discharge: 2023-12-15 | DRG: 314 | Disposition: A | Discharge status: Other Acute Inpatient Hospital | Attending: Internal Medicine | Admitting: Internal Medicine

## 2023-12-06 ENCOUNTER — Inpatient Hospital Stay (HOSPITAL_COMMUNITY)

## 2023-12-06 ENCOUNTER — Emergency Department (HOSPITAL_COMMUNITY)

## 2023-12-06 ENCOUNTER — Other Ambulatory Visit: Payer: Self-pay

## 2023-12-06 ENCOUNTER — Ambulatory Visit: Admitting: Physical Therapy

## 2023-12-06 DIAGNOSIS — E1122 Type 2 diabetes mellitus with diabetic chronic kidney disease: Secondary | ICD-10-CM | POA: Diagnosis present

## 2023-12-06 DIAGNOSIS — E872 Acidosis, unspecified: Secondary | ICD-10-CM | POA: Diagnosis present

## 2023-12-06 DIAGNOSIS — N186 End stage renal disease: Secondary | ICD-10-CM

## 2023-12-06 DIAGNOSIS — I5021 Acute systolic (congestive) heart failure: Secondary | ICD-10-CM

## 2023-12-06 DIAGNOSIS — I330 Acute and subacute infective endocarditis: Secondary | ICD-10-CM | POA: Diagnosis present

## 2023-12-06 DIAGNOSIS — Z880 Allergy status to penicillin: Secondary | ICD-10-CM

## 2023-12-06 DIAGNOSIS — I4891 Unspecified atrial fibrillation: Secondary | ICD-10-CM | POA: Diagnosis present

## 2023-12-06 DIAGNOSIS — D689 Coagulation defect, unspecified: Secondary | ICD-10-CM | POA: Diagnosis present

## 2023-12-06 DIAGNOSIS — E875 Hyperkalemia: Secondary | ICD-10-CM | POA: Diagnosis present

## 2023-12-06 DIAGNOSIS — I9589 Other hypotension: Secondary | ICD-10-CM | POA: Diagnosis present

## 2023-12-06 DIAGNOSIS — Z91148 Patient's other noncompliance with medication regimen for other reason: Secondary | ICD-10-CM

## 2023-12-06 DIAGNOSIS — Z930 Tracheostomy status: Secondary | ICD-10-CM | POA: Diagnosis not present

## 2023-12-06 DIAGNOSIS — I5020 Unspecified systolic (congestive) heart failure: Secondary | ICD-10-CM | POA: Diagnosis present

## 2023-12-06 DIAGNOSIS — D631 Anemia in chronic kidney disease: Secondary | ICD-10-CM | POA: Diagnosis present

## 2023-12-06 DIAGNOSIS — A419 Sepsis, unspecified organism: Secondary | ICD-10-CM

## 2023-12-06 DIAGNOSIS — E1165 Type 2 diabetes mellitus with hyperglycemia: Secondary | ICD-10-CM | POA: Diagnosis not present

## 2023-12-06 DIAGNOSIS — I428 Other cardiomyopathies: Secondary | ICD-10-CM | POA: Diagnosis present

## 2023-12-06 DIAGNOSIS — B49 Unspecified mycosis: Secondary | ICD-10-CM | POA: Diagnosis not present

## 2023-12-06 DIAGNOSIS — I5023 Acute on chronic systolic (congestive) heart failure: Secondary | ICD-10-CM | POA: Diagnosis present

## 2023-12-06 DIAGNOSIS — R1310 Dysphagia, unspecified: Secondary | ICD-10-CM | POA: Diagnosis not present

## 2023-12-06 DIAGNOSIS — I5082 Biventricular heart failure: Secondary | ICD-10-CM | POA: Diagnosis not present

## 2023-12-06 DIAGNOSIS — G8929 Other chronic pain: Secondary | ICD-10-CM | POA: Diagnosis present

## 2023-12-06 DIAGNOSIS — E11649 Type 2 diabetes mellitus with hypoglycemia without coma: Secondary | ICD-10-CM | POA: Diagnosis present

## 2023-12-06 DIAGNOSIS — Z7901 Long term (current) use of anticoagulants: Secondary | ICD-10-CM

## 2023-12-06 DIAGNOSIS — R6521 Severe sepsis with septic shock: Secondary | ICD-10-CM | POA: Diagnosis present

## 2023-12-06 DIAGNOSIS — Z7189 Other specified counseling: Secondary | ICD-10-CM | POA: Diagnosis not present

## 2023-12-06 DIAGNOSIS — T80211A Bloodstream infection due to central venous catheter, initial encounter: Secondary | ICD-10-CM | POA: Diagnosis present

## 2023-12-06 DIAGNOSIS — Z992 Dependence on renal dialysis: Secondary | ICD-10-CM | POA: Diagnosis not present

## 2023-12-06 DIAGNOSIS — L03116 Cellulitis of left lower limb: Secondary | ICD-10-CM | POA: Diagnosis present

## 2023-12-06 DIAGNOSIS — A411 Sepsis due to other specified staphylococcus: Secondary | ICD-10-CM

## 2023-12-06 DIAGNOSIS — I071 Rheumatic tricuspid insufficiency: Secondary | ICD-10-CM | POA: Diagnosis present

## 2023-12-06 DIAGNOSIS — Z91158 Patient's noncompliance with renal dialysis for other reason: Secondary | ICD-10-CM

## 2023-12-06 DIAGNOSIS — J9621 Acute and chronic respiratory failure with hypoxia: Secondary | ICD-10-CM | POA: Diagnosis present

## 2023-12-06 DIAGNOSIS — Z6841 Body Mass Index (BMI) 40.0 and over, adult: Secondary | ICD-10-CM | POA: Diagnosis not present

## 2023-12-06 DIAGNOSIS — B957 Other staphylococcus as the cause of diseases classified elsewhere: Secondary | ICD-10-CM | POA: Diagnosis present

## 2023-12-06 DIAGNOSIS — Z79899 Other long term (current) drug therapy: Secondary | ICD-10-CM

## 2023-12-06 DIAGNOSIS — E119 Type 2 diabetes mellitus without complications: Secondary | ICD-10-CM | POA: Diagnosis not present

## 2023-12-06 DIAGNOSIS — E871 Hypo-osmolality and hyponatremia: Secondary | ICD-10-CM | POA: Diagnosis present

## 2023-12-06 DIAGNOSIS — Z5982 Transportation insecurity: Secondary | ICD-10-CM

## 2023-12-06 DIAGNOSIS — E039 Hypothyroidism, unspecified: Secondary | ICD-10-CM | POA: Diagnosis present

## 2023-12-06 DIAGNOSIS — T827XXA Infection and inflammatory reaction due to other cardiac and vascular devices, implants and grafts, initial encounter: Secondary | ICD-10-CM

## 2023-12-06 DIAGNOSIS — J9622 Acute and chronic respiratory failure with hypercapnia: Secondary | ICD-10-CM | POA: Diagnosis present

## 2023-12-06 DIAGNOSIS — J189 Pneumonia, unspecified organism: Secondary | ICD-10-CM | POA: Diagnosis not present

## 2023-12-06 DIAGNOSIS — N2581 Secondary hyperparathyroidism of renal origin: Secondary | ICD-10-CM | POA: Diagnosis present

## 2023-12-06 DIAGNOSIS — I959 Hypotension, unspecified: Secondary | ICD-10-CM | POA: Diagnosis not present

## 2023-12-06 DIAGNOSIS — U071 COVID-19: Secondary | ICD-10-CM | POA: Diagnosis present

## 2023-12-06 DIAGNOSIS — E1142 Type 2 diabetes mellitus with diabetic polyneuropathy: Secondary | ICD-10-CM | POA: Diagnosis present

## 2023-12-06 DIAGNOSIS — Z9981 Dependence on supplemental oxygen: Secondary | ICD-10-CM

## 2023-12-06 DIAGNOSIS — R7881 Bacteremia: Secondary | ICD-10-CM

## 2023-12-06 DIAGNOSIS — K5900 Constipation, unspecified: Secondary | ICD-10-CM | POA: Diagnosis not present

## 2023-12-06 DIAGNOSIS — B9562 Methicillin resistant Staphylococcus aureus infection as the cause of diseases classified elsewhere: Secondary | ICD-10-CM | POA: Diagnosis not present

## 2023-12-06 DIAGNOSIS — Z888 Allergy status to other drugs, medicaments and biological substances status: Secondary | ICD-10-CM

## 2023-12-06 DIAGNOSIS — Z87891 Personal history of nicotine dependence: Secondary | ICD-10-CM

## 2023-12-06 DIAGNOSIS — Z813 Family history of other psychoactive substance abuse and dependence: Secondary | ICD-10-CM

## 2023-12-06 DIAGNOSIS — R579 Shock, unspecified: Secondary | ICD-10-CM | POA: Diagnosis not present

## 2023-12-06 DIAGNOSIS — E662 Morbid (severe) obesity with alveolar hypoventilation: Secondary | ICD-10-CM | POA: Diagnosis present

## 2023-12-06 DIAGNOSIS — J810 Acute pulmonary edema: Secondary | ICD-10-CM

## 2023-12-06 DIAGNOSIS — Z885 Allergy status to narcotic agent status: Secondary | ICD-10-CM

## 2023-12-06 DIAGNOSIS — Z515 Encounter for palliative care: Secondary | ICD-10-CM | POA: Diagnosis not present

## 2023-12-06 DIAGNOSIS — N049 Nephrotic syndrome with unspecified morphologic changes: Secondary | ICD-10-CM

## 2023-12-06 DIAGNOSIS — I4819 Other persistent atrial fibrillation: Secondary | ICD-10-CM | POA: Diagnosis present

## 2023-12-06 DIAGNOSIS — Z8249 Family history of ischemic heart disease and other diseases of the circulatory system: Secondary | ICD-10-CM

## 2023-12-06 DIAGNOSIS — E11621 Type 2 diabetes mellitus with foot ulcer: Secondary | ICD-10-CM | POA: Diagnosis present

## 2023-12-06 DIAGNOSIS — E66813 Obesity, class 3: Secondary | ICD-10-CM | POA: Diagnosis present

## 2023-12-06 DIAGNOSIS — J9611 Chronic respiratory failure with hypoxia: Secondary | ICD-10-CM | POA: Diagnosis present

## 2023-12-06 DIAGNOSIS — L97429 Non-pressure chronic ulcer of left heel and midfoot with unspecified severity: Secondary | ICD-10-CM | POA: Diagnosis present

## 2023-12-06 DIAGNOSIS — R0603 Acute respiratory distress: Secondary | ICD-10-CM | POA: Diagnosis present

## 2023-12-06 DIAGNOSIS — G894 Chronic pain syndrome: Secondary | ICD-10-CM | POA: Diagnosis not present

## 2023-12-06 DIAGNOSIS — L299 Pruritus, unspecified: Secondary | ICD-10-CM | POA: Diagnosis not present

## 2023-12-06 DIAGNOSIS — A4902 Methicillin resistant Staphylococcus aureus infection, unspecified site: Secondary | ICD-10-CM | POA: Diagnosis not present

## 2023-12-06 DIAGNOSIS — R570 Cardiogenic shock: Secondary | ICD-10-CM | POA: Diagnosis not present

## 2023-12-06 DIAGNOSIS — I132 Hypertensive heart and chronic kidney disease with heart failure and with stage 5 chronic kidney disease, or end stage renal disease: Secondary | ICD-10-CM | POA: Diagnosis present

## 2023-12-06 DIAGNOSIS — I4821 Permanent atrial fibrillation: Secondary | ICD-10-CM | POA: Diagnosis not present

## 2023-12-06 DIAGNOSIS — Z79890 Hormone replacement therapy (postmenopausal): Secondary | ICD-10-CM

## 2023-12-06 HISTORY — DX: End stage renal disease: Z99.2

## 2023-12-06 HISTORY — DX: End stage renal disease: N18.6

## 2023-12-06 LAB — CBC WITH DIFFERENTIAL/PLATELET
Abs Immature Granulocytes: 0.06 10*3/uL (ref 0.00–0.07)
Basophils Absolute: 0.1 10*3/uL (ref 0.0–0.1)
Basophils Relative: 1 %
Eosinophils Absolute: 0.1 10*3/uL (ref 0.0–0.5)
Eosinophils Relative: 1 %
HCT: 33.8 % — ABNORMAL LOW (ref 39.0–52.0)
Hemoglobin: 10.7 g/dL — ABNORMAL LOW (ref 13.0–17.0)
Immature Granulocytes: 1 %
Lymphocytes Relative: 6 %
Lymphs Abs: 0.8 10*3/uL (ref 0.7–4.0)
MCH: 31.8 pg (ref 26.0–34.0)
MCHC: 31.7 g/dL (ref 30.0–36.0)
MCV: 100.3 fL — ABNORMAL HIGH (ref 80.0–100.0)
Monocytes Absolute: 0.6 10*3/uL (ref 0.1–1.0)
Monocytes Relative: 5 %
Neutro Abs: 10.3 10*3/uL — ABNORMAL HIGH (ref 1.7–7.7)
Neutrophils Relative %: 86 %
Platelets: 262 10*3/uL (ref 150–400)
RBC: 3.37 MIL/uL — ABNORMAL LOW (ref 4.22–5.81)
RDW: 16.4 % — ABNORMAL HIGH (ref 11.5–15.5)
WBC: 11.9 10*3/uL — ABNORMAL HIGH (ref 4.0–10.5)
nRBC: 0.3 % — ABNORMAL HIGH (ref 0.0–0.2)

## 2023-12-06 LAB — I-STAT VENOUS BLOOD GAS, ED
Acid-base deficit: 10 mmol/L — ABNORMAL HIGH (ref 0.0–2.0)
Bicarbonate: 15.9 mmol/L — ABNORMAL LOW (ref 20.0–28.0)
Calcium, Ion: 0.95 mmol/L — ABNORMAL LOW (ref 1.15–1.40)
HCT: 30 % — ABNORMAL LOW (ref 39.0–52.0)
Hemoglobin: 10.2 g/dL — ABNORMAL LOW (ref 13.0–17.0)
O2 Saturation: 98 %
Potassium: 4.4 mmol/L (ref 3.5–5.1)
Sodium: 129 mmol/L — ABNORMAL LOW (ref 135–145)
TCO2: 17 mmol/L — ABNORMAL LOW (ref 22–32)
pCO2, Ven: 35.1 mmHg — ABNORMAL LOW (ref 44–60)
pH, Ven: 7.264 (ref 7.25–7.43)
pO2, Ven: 114 mmHg — ABNORMAL HIGH (ref 32–45)

## 2023-12-06 LAB — COMPREHENSIVE METABOLIC PANEL WITH GFR
ALT: 15 U/L (ref 0–44)
AST: 25 U/L (ref 15–41)
Albumin: 3.3 g/dL — ABNORMAL LOW (ref 3.5–5.0)
Alkaline Phosphatase: 239 U/L — ABNORMAL HIGH (ref 38–126)
Anion gap: 24 — ABNORMAL HIGH (ref 5–15)
BUN: 49 mg/dL — ABNORMAL HIGH (ref 6–20)
CO2: 18 mmol/L — ABNORMAL LOW (ref 22–32)
Calcium: 9.3 mg/dL (ref 8.9–10.3)
Chloride: 90 mmol/L — ABNORMAL LOW (ref 98–111)
Creatinine, Ser: 10.83 mg/dL — ABNORMAL HIGH (ref 0.61–1.24)
GFR, Estimated: 6 mL/min — ABNORMAL LOW (ref >60)
Glucose, Bld: 83 mg/dL (ref 70–99)
Potassium: 4.7 mmol/L (ref 3.5–5.1)
Sodium: 132 mmol/L — ABNORMAL LOW (ref 135–145)
Total Bilirubin: 2.3 mg/dL — ABNORMAL HIGH (ref 0.0–1.2)
Total Protein: 7.8 g/dL (ref 6.5–8.1)

## 2023-12-06 LAB — ECHOCARDIOGRAM LIMITED
Est EF: <20
Height: 72 in
S' Lateral: 6.1 cm
Weight: 6400 [oz_av]

## 2023-12-06 LAB — TROPONIN I (HIGH SENSITIVITY)
Troponin I (High Sensitivity): 57 ng/L — ABNORMAL HIGH (ref <18)
Troponin I (High Sensitivity): 23 ng/L — ABNORMAL HIGH (ref <18)

## 2023-12-06 LAB — RENAL FUNCTION PANEL
Albumin: 3 g/dL — ABNORMAL LOW (ref 3.5–5.0)
Anion gap: 23 — ABNORMAL HIGH (ref 5–15)
BUN: 49 mg/dL — ABNORMAL HIGH (ref 6–20)
CO2: 14 mmol/L — ABNORMAL LOW (ref 22–32)
Calcium: 8.8 mg/dL — ABNORMAL LOW (ref 8.9–10.3)
Chloride: 92 mmol/L — ABNORMAL LOW (ref 98–111)
Creatinine, Ser: 9.57 mg/dL — ABNORMAL HIGH (ref 0.61–1.24)
GFR, Estimated: 7 mL/min — ABNORMAL LOW (ref >60)
Glucose, Bld: 102 mg/dL — ABNORMAL HIGH (ref 70–99)
Phosphorus: 7.5 mg/dL — ABNORMAL HIGH (ref 2.5–4.6)
Potassium: 5.2 mmol/L — ABNORMAL HIGH (ref 3.5–5.1)
Sodium: 129 mmol/L — ABNORMAL LOW (ref 135–145)

## 2023-12-06 LAB — GAMMA GT: GGT: 30 U/L (ref 7–50)

## 2023-12-06 LAB — GLUCOSE, CAPILLARY
Glucose-Capillary: 100 mg/dL — ABNORMAL HIGH (ref 70–99)
Glucose-Capillary: 101 mg/dL — ABNORMAL HIGH (ref 70–99)
Glucose-Capillary: 107 mg/dL — ABNORMAL HIGH (ref 70–99)
Glucose-Capillary: 111 mg/dL — ABNORMAL HIGH (ref 70–99)
Glucose-Capillary: 130 mg/dL — ABNORMAL HIGH (ref 70–99)
Glucose-Capillary: 57 mg/dL — ABNORMAL LOW (ref 70–99)
Glucose-Capillary: 57 mg/dL — ABNORMAL LOW (ref 70–99)
Glucose-Capillary: 61 mg/dL — ABNORMAL LOW (ref 70–99)
Glucose-Capillary: 90 mg/dL (ref 70–99)

## 2023-12-06 LAB — DIGOXIN LEVEL: Digoxin Level: <0.4 ng/mL — ABNORMAL LOW (ref 0.8–2.0)

## 2023-12-06 LAB — HEMOGLOBIN A1C
Hgb A1c MFr Bld: 6.2 % — ABNORMAL HIGH (ref 4.8–5.6)
Mean Plasma Glucose: 131 mg/dL

## 2023-12-06 LAB — RESP PANEL BY RT-PCR (RSV, FLU A&B, COVID)  RVPGX2
Influenza A by PCR: NEGATIVE
Influenza B by PCR: NEGATIVE
Resp Syncytial Virus by PCR: NEGATIVE
SARS Coronavirus 2 by RT PCR: POSITIVE — AB

## 2023-12-06 LAB — PROTIME-INR
INR: 3.5 — ABNORMAL HIGH (ref 0.8–1.2)
Prothrombin Time: 35.7 s — ABNORMAL HIGH (ref 11.4–15.2)

## 2023-12-06 LAB — I-STAT CG4 LACTIC ACID, ED: Lactic Acid, Venous: 7.9 mmol/L (ref 0.5–1.9)

## 2023-12-06 LAB — BRAIN NATRIURETIC PEPTIDE: B Natriuretic Peptide: 1176.1 pg/mL — ABNORMAL HIGH (ref 0.0–100.0)

## 2023-12-06 LAB — LACTIC ACID, PLASMA
Lactic Acid, Venous: 4.8 mmol/L (ref 0.5–1.9)
Lactic Acid, Venous: >9 mmol/L (ref 0.5–1.9)

## 2023-12-06 LAB — MRSA NEXT GEN BY PCR, NASAL: MRSA by PCR Next Gen: NOT DETECTED

## 2023-12-06 MED ORDER — DOCUSATE SODIUM 100 MG PO CAPS
100.0000 mg | ORAL_CAPSULE | Freq: Two times a day (BID) | ORAL | Status: DC | PRN
  Filled 2023-12-06: qty 1

## 2023-12-06 MED ORDER — VENLAFAXINE HCL ER 37.5 MG PO CP24
37.5000 mg | ORAL_CAPSULE | Freq: Every day | ORAL | Status: DC
  Administered 2023-12-06 – 2023-12-15 (×10): 37.5 mg via ORAL
  Filled 2023-12-06 (×10): qty 1

## 2023-12-06 MED ORDER — LACTATED RINGERS IV BOLUS
1000.0000 mL | Freq: Once | INTRAVENOUS | Status: AC
  Administered 2023-12-06: 1000 mL via INTRAVENOUS

## 2023-12-06 MED ORDER — VANCOMYCIN HCL 2000 MG/400ML IV SOLN: 2000.0000 mg | Freq: Once | INTRAVENOUS | Status: DC

## 2023-12-06 MED ORDER — PRISMASOL BGK 4/2.5 32-4-2.5 MEQ/L EC SOLN: Status: DC

## 2023-12-06 MED ORDER — VANCOMYCIN HCL IN DEXTROSE 1-5 GM/200ML-% IV SOLN
1000.0000 mg | Freq: Once | INTRAVENOUS | Status: DC
  Filled 2023-12-06: qty 200

## 2023-12-06 MED ORDER — LACTATED RINGERS IV SOLN: INTRAVENOUS | Status: DC

## 2023-12-06 MED ORDER — METRONIDAZOLE 500 MG/100ML IV SOLN
500.0000 mg | Freq: Once | INTRAVENOUS | Status: AC
  Administered 2023-12-06: 500 mg via INTRAVENOUS
  Filled 2023-12-06: qty 100

## 2023-12-06 MED ORDER — LEVOTHYROXINE SODIUM 25 MCG PO TABS
25.0000 ug | ORAL_TABLET | Freq: Every day | ORAL | Status: DC
  Administered 2023-12-06 – 2023-12-14 (×9): 25 ug via ORAL
  Filled 2023-12-06 (×9): qty 1

## 2023-12-06 MED ORDER — SODIUM CHLORIDE 0.9 % IV SOLN
2.0000 g | Freq: Once | INTRAVENOUS | Status: AC
  Administered 2023-12-06: 2 g via INTRAVENOUS
  Filled 2023-12-06: qty 12.5

## 2023-12-06 MED ORDER — LINEZOLID 600 MG/300ML IV SOLN
600.0000 mg | Freq: Once | INTRAVENOUS | Status: AC
  Administered 2023-12-06: 600 mg via INTRAVENOUS
  Filled 2023-12-06: qty 300

## 2023-12-06 MED ORDER — FENTANYL 12 MCG/HR TD PT72
1.0000 | MEDICATED_PATCH | TRANSDERMAL | Status: DC
  Administered 2023-12-06 – 2023-12-15 (×4): 1 via TRANSDERMAL
  Filled 2023-12-06 (×5): qty 1

## 2023-12-06 MED ORDER — NOREPINEPHRINE 4 MG/250ML-% IV SOLN
2.0000 ug/min | INTRAVENOUS | Status: DC
  Administered 2023-12-06: 5 ug/min via INTRAVENOUS
  Administered 2023-12-06 – 2023-12-07 (×2): 4 ug/min via INTRAVENOUS
  Administered 2023-12-08: 3 ug/min via INTRAVENOUS
  Filled 2023-12-06 (×3): qty 250

## 2023-12-06 MED ORDER — SODIUM BICARBONATE 8.4 % IV SOLN
INTRAVENOUS | Status: AC
  Filled 2023-12-06: qty 50

## 2023-12-06 MED ORDER — PANTOPRAZOLE SODIUM 40 MG PO TBEC
40.0000 mg | DELAYED_RELEASE_TABLET | Freq: Every day | ORAL | Status: DC
  Administered 2023-12-06 – 2023-12-09 (×4): 40 mg via ORAL
  Filled 2023-12-06 (×4): qty 1

## 2023-12-06 MED ORDER — DEXTROSE 50 % IV SOLN
INTRAVENOUS | Status: AC
  Administered 2023-12-06: 50 mL
  Filled 2023-12-06: qty 50

## 2023-12-06 MED ORDER — INSULIN ASPART 100 UNIT/ML IJ SOLN: 0.0000 [IU] | INTRAMUSCULAR | Status: DC

## 2023-12-06 MED ORDER — MIDODRINE HCL 5 MG PO TABS
30.0000 mg | ORAL_TABLET | Freq: Three times a day (TID) | ORAL | Status: DC
  Administered 2023-12-06 – 2023-12-14 (×22): 30 mg via ORAL
  Filled 2023-12-06 (×26): qty 6

## 2023-12-06 MED ORDER — APIXABAN 5 MG PO TABS
5.0000 mg | ORAL_TABLET | Freq: Two times a day (BID) | ORAL | Status: DC
  Administered 2023-12-06 – 2023-12-12 (×14): 5 mg via ORAL
  Filled 2023-12-06 (×14): qty 1

## 2023-12-06 MED ORDER — ORAL CARE MOUTH RINSE: 15.0000 mL | OROMUCOSAL | Status: DC | PRN

## 2023-12-06 MED ORDER — OXYCODONE HCL 5 MG PO TABS
10.0000 mg | ORAL_TABLET | Freq: Three times a day (TID) | ORAL | Status: DC | PRN
  Administered 2023-12-06 (×2): 10 mg via ORAL
  Filled 2023-12-06 (×2): qty 2

## 2023-12-06 MED ORDER — POLYETHYLENE GLYCOL 3350 17 G PO PACK: 17.0000 g | PACK | Freq: Every day | ORAL | Status: DC | PRN

## 2023-12-06 MED ORDER — SODIUM CHLORIDE 0.9 % IV SOLN: 250.0000 mL | INTRAVENOUS | Status: AC

## 2023-12-06 MED ORDER — LACTATED RINGERS IV BOLUS
500.0000 mL | Freq: Once | INTRAVENOUS | Status: AC
  Administered 2023-12-06: 500 mL via INTRAVENOUS

## 2023-12-06 MED ORDER — LACTATED RINGERS IV BOLUS (SEPSIS)
500.0000 mL | Freq: Once | INTRAVENOUS | Status: AC
  Administered 2023-12-06: 500 mL via INTRAVENOUS

## 2023-12-06 MED ORDER — ONDANSETRON HCL 4 MG/2ML IJ SOLN
4.0000 mg | Freq: Four times a day (QID) | INTRAMUSCULAR | Status: DC | PRN
  Administered 2023-12-07 – 2023-12-08 (×2): 4 mg via INTRAVENOUS
  Filled 2023-12-06 (×2): qty 2

## 2023-12-06 MED ORDER — NOREPINEPHRINE 4 MG/250ML-% IV SOLN
0.0000 ug/min | INTRAVENOUS | Status: DC
Start: 2023-12-06 — End: 2023-12-06
  Administered 2023-12-06: 6 ug/min via INTRAVENOUS
  Filled 2023-12-06: qty 250

## 2023-12-06 MED ORDER — SODIUM BICARBONATE 8.4 % IV SOLN
100.0000 meq | Freq: Once | INTRAVENOUS | Status: AC
  Administered 2023-12-06: 100 meq via INTRAVENOUS
  Filled 2023-12-06: qty 50

## 2023-12-06 MED ORDER — PREGABALIN 25 MG PO CAPS
50.0000 mg | ORAL_CAPSULE | Freq: Every day | ORAL | Status: DC
  Administered 2023-12-06 – 2023-12-15 (×10): 50 mg via ORAL
  Filled 2023-12-06 (×10): qty 2

## 2023-12-06 MED ORDER — CHLORHEXIDINE GLUCONATE CLOTH 2 % EX PADS
6.0000 | MEDICATED_PAD | Freq: Every day | CUTANEOUS | Status: DC
  Administered 2023-12-06 – 2023-12-08 (×2): 6 via TOPICAL

## 2023-12-06 MED ORDER — DIGOXIN 125 MCG PO TABS
0.1250 mg | ORAL_TABLET | ORAL | Status: DC
  Administered 2023-12-07 – 2023-12-14 (×4): 0.125 mg via ORAL
  Filled 2023-12-06 (×4): qty 1

## 2023-12-06 NOTE — Progress Notes (Signed)
  Echocardiogram 2D Echocardiogram has been performed.  Fain Home RDCS 12/06/2023, 8:28 AM

## 2023-12-06 NOTE — Progress Notes (Signed)
 RN met with patient requesting water . RN reemphasized to patient the importance of strict intake and output. RN also reminded patient of NPO status. Patient continues to demand having water .   Patient response is as is: "I know what being fluid overloaded feels like. The doctor said I can have what I want."   RN will give patient 120 of water  and will document in intake and output.

## 2023-12-06 NOTE — Progress Notes (Signed)
 NAME:  Phillip Young, MRN:  [id number], DOB:  05-10-1986, LOS: 0 ADMISSION DATE:  12/06/2023, CONSULTATION DATE:  5/13/2 REFERRING MD:  EDP, CHIEF COMPLAINT:  hypotension and hypoxemia   History of Present Illness:   38 yo chronically ill male with PMH significant biventricular HF, chronic Afib on Eliquis , chronic hypotension on midodrine  30mg  TID, ESRD on MWF iHD, chronic hypoxic/ hypercarbic respiratory failure on 2-4L [place] and NIV, severe morbid obesity, and chronic pain presented with SOB, continued increased LE swelling with pain, and weeping from left foot.  Denies recent fever or chills.  Last iHD 5/9.  Found to be hypotensive requiring levophed  support and NIV for WOB in ER.  CXR clear.  BNP 1176, lactate increasing 4.8> 7.9, WBC 11.9 but afebrile.  Concern for sepsis given LLE wound and other wounds> pt unable to lay flat and or examine due to pain.  Cefepime , linezolid , and flagyl  started.   Recent MRSE and E. Faecalis bacteremia 4/8 felt 2/2 HD cath, not a candidate for TEE to rule out endocarditis, on vanc w/ iHD scheduled till 5/25 however pt has been refusing for 2 weeks due to it causing leg pain.  Fifth hospitalization this year.     Pertinent  Medical History  Biventricular HF Chronic Afib on Eliquis   ESRD MWF Chronic hypotension on midodrine  Chronic hypoxic/ hypercarbic respiratory failure Severe morbid obesity LLE cellulitis with wound Sacral decub> resolved Chronic macrocytic anemia Chronic pain HITT- 2022, SRA + Suspected prior amiodarone  toxicity   Significant Hospital Events: Including procedures, antibiotic start and stop dates in addition to other pertinent events   Admitted to ICU 5/13  Interim History / Subjective:  Off NIV  complaining of severe pain due to extremity swelling and chronic pain and wants to eat/ drink  Weaning levophed >  4 mcg Lactic remains > 9  Objective    Blood pressure (!) 121/90, pulse (!) 114, temperature 98.7 F (37.1 C),  temperature source Oral, resp. rate (!) 31, height 6' (1.829 m), weight (!) 181.4 kg, SpO2 97%.    FiO2 (%):  [50 %] 50 %   Intake/Output Summary (Last 24 hours) at 12/06/2023 1033 Last data filed at 12/06/2023 0900 Gross per 24 hour  Intake 2122.82 ml  Output --  Net 2122.82 ml   Filed Weights   12/06/23 0213  Weight: (!) 181.4 kg   Examination: General:  AoC ill appearing morbidly obese male sitting upright in bed c/o of pain everywhere HEENT: MM pink/moist, voice hoarse, unable to determine JVD Neuro: Alert, oriented, MAE CV: irir, Afib rates 120, distant PULM:  intermittently conversationally dyspneic, clear anteriorly, no wheeze, on 4L [place]- prefers no humidity  GI: soft, bs+, no abd tenderness  Extremities: warm/dry, anasarca edema  Skin: left foot and left thigh- non expressible drainage, scant clear fluid on dressing, no erythema, no open sores/ wounds in rectum/ perineum   Labs/ images reviewed  afebrile  Resolved problem list   Assessment and Plan   Shock - suspect acute on chronic biventricular HF with severe volume overload.  Sepsis less suspected but given recent bacteremia and abx non-compliance, remains in differential Chronic hypotension - reports compliance with meds Chronic Afib on Eliquis   P:  - cont to wean NE for MAP goal > 60/ SBP > 90 - cont midodrine  72m gTID - repeat limited echo pending - trop hs flat neg> 57> 23 - suspect un resolving lactic due to HF exacerbation but given improving vasopressor requirement, cont to monitor - stop  fluids> volume removal per CRRT as below - cont digoxin , no amio with prior concern for amio toxicity.  Rate controlled  - resume eliquis > no procedures planned.  Has [facility] for CRRT - hold on further imaging at this time.  Pt willing to let us  look more into wounds.  Discussed with attending> holding on abx at this time> follow cultures and fever/ WBC trend - chronic hypotension limites GDMT.  Pt not interesting in seeing  [facility] - suspect med non-compliance could be contributing to recurrent admissions, noted on last admit> financial limitations contributing to inconsistent medication adherence.  Of note, on pharmacy review, his midodrine  was last filled 05/2023 for 30 day supply.     ESRD on MWF Hypervolemic hyponatremia AGMA/ lactic acidosis - frequently stops iHD early on EMR review due to cramping - EDW 155.5> current wt 181kg - TDC placed 11/10/23 P:  - appreciate Nephrology consult.  Plans to start CRRT due to ongoing vasopressor needs and volume removal.    Acute on chronic hypoxic/ hypercarbic resp failure on NIV - CXR clear - cont supplemental O2 for sat goal > 88% - NIV with naps and at bedtime or increased WOB - aggressive pulm hygiene    T2DM - SSI prn    Alk phos elevated - chronically, likely due to ESRD   Anemia of chronic disease  Coagulopathy, INR 3.5 - on eliquis , no evidence of bleeding, H/H stable   Hypothyroidism - recheck TSH, last 4.9 on 09/08/23 - cont synthroid     Severe morbid obesity- BMI 54 Functional deconditioning  - financial limitations have prevented prior GLP-1   Chronic wounds - no erythema or purulence, cont supportive care.  Old sacral scarring.    Chronic pain/ polyneuropathy  - cont lyrica , fentanyl  12 mcg patch, and oxy prn q 8 w/ bowel regimen - cont effexor     GOC - pt requesting DNI but ok with CPR and other meds/ interventions.  Wants to use NIV for resp distress.   Pt seems to have poor insight into disease issue/ severity.   Best Practice (right click and "Reselect all SmartList Selections" daily)   Diet/type: NPO w/ oral meds> advance as tolerated to renal/ heart healthy DVT prophylaxis SCD> restart eliquis   Pressure ulcer(s): present on admission  GI prophylaxis: PPI Lines: N/A Foley:  N/A Code Status:  full code> does not want intubation  Last date of multidisciplinary goals of care discussion [12/06/23]  Labs    CBC: Recent Labs  Lab 12/06/23 0216 12/06/23 0323  WBC 11.9*  --   NEUTROABS 10.3*  --   HGB 10.7* 10.2*  HCT 33.8* 30.0*  MCV 100.3*  --   PLT 262  --     Basic Metabolic Panel: Recent Labs  Lab 12/06/23 0216 12/06/23 0323  NA 132* 129*  K 4.7 4.4  CL 90*  --   CO2 18*  --   GLUCOSE 83  --   BUN 49*  --   CREATININE 10.83*  --   CALCIUM  9.3  --    GFR: Estimated Creatinine Clearance: 15.6 mL/min (A) (by C-G formula based on SCr of 10.83 mg/dL (H)). Recent Labs  Lab 12/06/23 0216 12/06/23 0232 12/06/23 0328 12/06/23 0430  WBC 11.9*  --   --   --   LATICACIDVEN  --  4.8* 7.9* >9.0*    Liver Function Tests: Recent Labs  Lab 12/06/23 0216  AST 25  ALT 15  ALKPHOS 239*  BILITOT 2.3*  PROT 7.8  ALBUMIN  3.3*   No results for input(s): "LIPASE", "AMYLASE" in the last 168 hours. No results for input(s): "AMMONIA" in the last 168 hours.  ABG    Component Value Date/Time   PHART 7.31 (L) 03/08/2023 2030   PCO2ART 37 03/08/2023 2030   PO2ART 127 (H) 03/08/2023 2030   HCO3 15.9 (L) 12/06/2023 0323   TCO2 17 (L) 12/06/2023 0323   ACIDBASEDEF 10.0 (H) 12/06/2023 0323   O2SAT 98 12/06/2023 0323     Coagulation Profile: Recent Labs  Lab 12/06/23 0430  INR 3.5*    Cardiac Enzymes: No results for input(s): "CKTOTAL", "CKMB", "CKMBINDEX", "TROPONINI" in the last 168 hours.  HbA1C: Hemoglobin A1C  Date/Time Value Ref Range Status  06/21/2023 03:09 PM 4.6 4.0 - 5.6 % Final  11/05/2022 11:44 AM 5.6 4.0 - 5.6 % Final   Hgb A1c MFr Bld  Date/Time Value Ref Range Status  03/26/2022 07:58 PM 5.3 4.8 - 5.6 % Final    Comment:    (NOTE) Pre diabetes:          5.7%-6.4%  Diabetes:              >6.4%  Glycemic control for   <7.0% adults with diabetes   08/19/2021 05:40 AM 5.9 (H) 4.8 - 5.6 % Final    Comment:    (NOTE)         Prediabetes: 5.7 - 6.4         Diabetes: >6.4         Glycemic control for adults with diabetes: <7.0     CBG: Recent  Labs  Lab 12/06/23 0610 12/06/23 0756  GLUCAP 101* 90     Allergies Allergies  Allergen Reactions   Amiodarone  Other (See Comments)    Suspicion for amiodarone  lung/hepatotoxicity    Coreg  [Carvedilol ] Shortness Of Breath and Diarrhea    Wheezing    Heparin  Other (See Comments)    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, SRA positive   Metoprolol  Other (See Comments)    near syncope   Amoxicillin  Other (See Comments)    Was hospitalized    Other Swelling and Other (See Comments)    Steroids Fluid seeping out of legs    Tramadol       Home Medications  Prior to Admission medications   Medication Sig Start Date End Date Taking? Authorizing Provider  acetaminophen  (TYLENOL ) 500 MG tablet Take 2 tablets (1,000 mg total) by mouth every 6 (six) hours. Patient taking differently: Take 1,000 mg by mouth every 4 (four) hours as needed for mild pain (pain score 1-3) or moderate pain (pain score 4-6). 09/06/23   Malen Scudder, DO  apixaban  (ELIQUIS ) 5 MG TABS tablet Take 1 tablet (5 mg total) by mouth 2 (two) times daily. 06/03/23   Carleen Chary, DO  vitamin D3 (CHOLECALCIFEROL ) 25 MCG tablet Take 0.5 tablets (500 Units total) by mouth daily. 11/12/23   [name], MD  cinacalcet  (SENSIPAR ) 30 MG tablet Take 1 tablet (30 mg total) by mouth every dialysis. Patient taking differently: Take 30 mg by mouth every Monday, Wednesday, and Friday. 09/12/23   Sheree Dieter, MD  clindamycin  (CLINDAGEL ) 1 % gel Apply topically 2 (two) times daily. Patient taking differently: Apply 1 Application topically 2 (two) times daily. 10/06/23   Carleen Chary, DO  Darbepoetin Alfa  (ARANESP ) 150 MCG/0.3ML SOSY injection To be given during dialysis by [facility] per Nephrology. Not to be given to patient at [facility]. 09/06/23   Sheree Dieter, MD  digoxin  (LANOXIN ) 0.125 MG tablet Take 1 tablet (0.125 mg total) by mouth 3 (three) times a week. Monday, Wednesday, Friday (dialysis days) 11/14/23    [name], MD  fentaNYL  (DURAGESIC ) 12 MCG/HR Place 1 patch onto the skin every 3 (three) days. 11/14/23   [name], MD  levothyroxine  (SYNTHROID ) 25 MCG tablet Take 1 tablet (25 mcg total) by mouth daily at 6 (six) AM. 11/12/23   [name], MD  midodrine  (PROAMATINE ) 10 MG tablet Take 3 tablets (30 mg total) by mouth 3 (three) times daily with meals. Patient not taking: Reported on 11/03/2023 06/20/23   Setzer, Sandra J, PA-C  naloxone [facility]) 2 MG/2ML injection Inject 2 mg into the skin as needed. Patient not taking: Reported on 11/03/2023    [provider]  Oxycodone  HCl 10 MG TABS Take 1 tablet (10 mg total) by mouth every 6 (six) hours as needed. 12/02/23   Carleen Chary, DO  pregabalin  (LYRICA ) 50 MG capsule Take 1 capsule (50 mg total) by mouth daily. Take one capsule daily. 11/12/23   [name], MD  sevelamer  carbonate (RENVELA ) 800 MG tablet Take 2 tablets (1,600 mg total) by mouth 3 (three) times daily with meals. 11/12/23   [name], MD  tirzepatide [facility]) 2.5 MG/0.5ML injection vial Inject 2.5 mg into the skin once a week. Patient not taking: Reported on 10/11/2023 08/25/23   Sheree Dieter, MD  vancomycin  IVPB Inject 1,000 mg into the vein every Monday, Wednesday, and Friday with hemodialysis. Indication:  MRSE + E faecalis bacteremia Last Day of Therapy:  12/18/23 Labs - weekly w/ HD:  CBC/D, BMP, and vancomycin  trough; ESR and CRP Method of administration:Elastomeric or per HD protocol - to be given at the HD center 11/09/23 12/18/23  Ernie Heal, Jerelyn Money, MD  venlafaxine  XR (EFFEXOR  XR) 37.5 MG 24 hr capsule Take 1 capsule (37.5 mg total) by mouth daily. 11/12/23 11/11/24  [name], MD     Critical care time: 35 min       Early Glisson, MSN, AG-ACNP-BC [facility] 12/06/2023, 10:33 AM  See Amion for pager If no response to pager , please call [phone] until 7pm After 7:00 pm call Elink  [phone]

## 2023-12-06 NOTE — Procedures (Signed)
 I have reviewed the pt's issues and adjusted the CRRT session to match the needs of the patient.  Larry Poag MD  [facility] 12/06/2023, 12:06 PM

## 2023-12-06 NOTE — ED Triage Notes (Addendum)
 Patient BIB [facility] for resp. distress 30 minutes PTA EMS arrival, patient arrived with non-rebreather, on 15 LPM O2, states chest and neck pain. CBG 126 en route.

## 2023-12-06 NOTE — ED Provider Notes (Signed)
 [place] EMERGENCY DEPARTMENT AT [facility] Provider Note   CSN: [id number] Arrival date & time: 12/06/23  0206     History  Chief Complaint  Patient presents with   Respiratory Distress    Phillip Young is a 38 y.o. male.  38 yo male with chronic hypoxic respiratory failure on 2-4L home O2, HFrEF w/LVEF <20%, Afib on Eliquis , chronic pain, ESRD on HD MWF presents with difficulty breathing onset suddenly tonight.  States he was using some grease to fix his mother's wheelchair and became suddenly short of breath.  He felt tightness in his chest that radiated to his neck.  He increased his oxygen  from 2 to 4 L to 6 and then called EMS.  EMS reports he was in atrial fibrillation on arrival with rates as high as 140s.  They placed him on a nonrebreather for O2 saturations in the 60s.  He denies productive cough.  Denies fever.  He missed dialysis today due to the rain.  His last dialysis session was Friday, May 9.  States he does make some urine.  Was recently hospitalized for bacteremia.  The history is provided by the patient and the EMS personnel. The history is limited by the condition of the patient.       Home Medications Prior to Admission medications   Medication Sig Start Date End Date Taking? Authorizing Provider  acetaminophen  (TYLENOL ) 500 MG tablet Take 2 tablets (1,000 mg total) by mouth every 6 (six) hours. Patient taking differently: Take 1,000 mg by mouth every 4 (four) hours as needed for mild pain (pain score 1-3) or moderate pain (pain score 4-6). 09/06/23   Malen Scudder, DO  apixaban  (ELIQUIS ) 5 MG TABS tablet Take 1 tablet (5 mg total) by mouth 2 (two) times daily. 06/03/23   Carleen Chary, DO  vitamin D3 (CHOLECALCIFEROL ) 25 MCG tablet Take 0.5 tablets (500 Units total) by mouth daily. 11/12/23   [name], MD  cinacalcet  (SENSIPAR ) 30 MG tablet Take 1 tablet (30 mg total) by mouth every dialysis. Patient taking differently: Take 30 mg by mouth  every Monday, Wednesday, and Friday. 09/12/23   Sheree Dieter, MD  clindamycin  (CLINDAGEL ) 1 % gel Apply topically 2 (two) times daily. Patient taking differently: Apply 1 Application topically 2 (two) times daily. 10/06/23   Carleen Chary, DO  Darbepoetin Alfa  (ARANESP ) 150 MCG/0.3ML SOSY injection To be given during dialysis by [facility] per Nephrology. Not to be given to patient at SNF. 09/06/23   Sheree Dieter, MD  digoxin  (LANOXIN ) 0.125 MG tablet Take 1 tablet (0.125 mg total) by mouth 3 (three) times a week. Monday, Wednesday, Friday (dialysis days) 11/14/23   [name], MD  fentaNYL  (DURAGESIC ) 12 MCG/HR Place 1 patch onto the skin every 3 (three) days. 11/14/23   [name], MD  levothyroxine  (SYNTHROID ) 25 MCG tablet Take 1 tablet (25 mcg total) by mouth daily at 6 (six) AM. 11/12/23   [name], MD  midodrine  (PROAMATINE ) 10 MG tablet Take 3 tablets (30 mg total) by mouth 3 (three) times daily with meals. Patient not taking: Reported on 11/03/2023 06/20/23   Setzer, Sandra J, PA-C  naloxone [facility]) 2 MG/2ML injection Inject 2 mg into the skin as needed. Patient not taking: Reported on 11/03/2023    [provider]  Oxycodone  HCl 10 MG TABS Take 1 tablet (10 mg total) by mouth every 6 (six) hours as needed. 12/02/23   Carleen Chary, DO  pregabalin  (LYRICA ) 50 MG capsule  Take 1 capsule (50 mg total) by mouth daily. Take one capsule daily. 11/12/23   [name], MD  sevelamer  carbonate (RENVELA ) 800 MG tablet Take 2 tablets (1,600 mg total) by mouth 3 (three) times daily with meals. 11/12/23   [name], MD  tirzepatide [facility]) 2.5 MG/0.5ML injection vial Inject 2.5 mg into the skin once a week. Patient not taking: Reported on 10/11/2023 08/25/23   Sheree Dieter, MD  vancomycin  IVPB Inject 1,000 mg into the vein every Monday, Wednesday, and Friday with hemodialysis. Indication:  MRSE + E faecalis bacteremia Last Day of Therapy:   12/18/23 Labs - weekly w/ HD:  CBC/D, BMP, and vancomycin  trough; ESR and CRP Method of administration:Elastomeric or per HD protocol - to be given at the HD center 11/09/23 12/18/23  Ernie Heal, Jerelyn Money, MD  venlafaxine  XR (EFFEXOR  XR) 37.5 MG 24 hr capsule Take 1 capsule (37.5 mg total) by mouth daily. 11/12/23 11/11/24  [name], MD      Allergies    Amiodarone , Coreg  [carvedilol ], Heparin , Metoprolol , Amoxicillin , Other, and Tramadol     Review of Systems   Review of Systems  Constitutional:  Positive for fatigue. Negative for activity change, appetite change and fever.  HENT:  Negative for congestion.   Respiratory:  Positive for cough, chest tightness and shortness of breath.   Cardiovascular:  Negative for chest pain.  Gastrointestinal:  Negative for abdominal pain, nausea and vomiting.  Genitourinary:  Negative for dysuria and hematuria.  Musculoskeletal:  Positive for arthralgias and myalgias.  Skin:  Negative for rash.  Neurological:  Positive for weakness. Negative for dizziness and headaches.   all other systems are negative except as noted in the HPI and PMH.    Physical Exam Updated Vital Signs BP 90/61 (BP Location: Right Arm)   Pulse (!) 121   Resp (!) 26   Ht 6' (1.829 m)   Wt (!) 181.4 kg   SpO2 100%   BMI 54.25 kg/m  Physical Exam Vitals and nursing note reviewed.  Constitutional:      General: He is in acute distress.     Appearance: He is well-developed. He is obese. He is ill-appearing.     Comments: Tachypneic.  HENT:     Head: Normocephalic and atraumatic.     Mouth/Throat:     Pharynx: No oropharyngeal exudate.  Eyes:     Conjunctiva/sclera: Conjunctivae normal.     Pupils: Pupils are equal, round, and reactive to light.  Neck:     Comments: No meningismus. Cardiovascular:     Rate and Rhythm: Tachycardia present. Rhythm irregular.     Heart sounds: Normal heart sounds. No murmur heard. Pulmonary:     Effort: Pulmonary effort is normal. No  respiratory distress.     Breath sounds: Normal breath sounds.  Abdominal:     Palpations: Abdomen is soft.     Tenderness: There is no abdominal tenderness. There is no guarding or rebound.  Musculoskeletal:        General: No tenderness. Normal range of motion.     Cervical back: Normal range of motion and neck supple.     Comments: Chronic appearing fistula wound to sacrum There is tenderness and chronic superficial skin changes to right medal and proximal thigh. No erythema or fluctuance appreciated  Skin:    General: Skin is warm.  Neurological:     Mental Status: He is alert and oriented to person, place, and time.     Cranial Nerves: No  cranial nerve deficit.     Motor: No abnormal muscle tone.     Coordination: Coordination normal.     Comments:  5/5 strength throughout. CN 2-12 intact.Equal grip strength.   Psychiatric:        Behavior: Behavior normal.     ED Results / Procedures / Treatments   Labs (all labs ordered are listed, but only abnormal results are displayed) Labs Reviewed  CBC WITH DIFFERENTIAL/PLATELET - Abnormal; Notable for the following components:      Result Value   WBC 11.9 (*)    RBC 3.37 (*)    Hemoglobin 10.7 (*)    HCT 33.8 (*)    MCV 100.3 (*)    RDW 16.4 (*)    nRBC 0.3 (*)    Neutro Abs 10.3 (*)    All other components within normal limits  COMPREHENSIVE METABOLIC PANEL WITH GFR - Abnormal; Notable for the following components:   Sodium 132 (*)    Chloride 90 (*)    CO2 18 (*)    BUN 49 (*)    Creatinine, Ser 10.83 (*)    Albumin  3.3 (*)    Alkaline Phosphatase 239 (*)    Total Bilirubin 2.3 (*)    GFR, Estimated 6 (*)    Anion gap 24 (*)    All other components within normal limits  LACTIC ACID, PLASMA - Abnormal; Notable for the following components:   Lactic Acid, Venous 4.8 (*)    All other components within normal limits  LACTIC ACID, PLASMA - Abnormal; Notable for the following components:   Lactic Acid, Venous >9.0 (*)     All other components within normal limits  BRAIN NATRIURETIC PEPTIDE - Abnormal; Notable for the following components:   B Natriuretic Peptide 1,176.1 (*)    All other components within normal limits  DIGOXIN  LEVEL - Abnormal; Notable for the following components:   Digoxin  Level <0.4 (*)    All other components within normal limits  PROTIME-INR - Abnormal; Notable for the following components:   Prothrombin Time 35.7 (*)    INR 3.5 (*)    All other components within normal limits  I-STAT CG4 LACTIC ACID, ED - Abnormal; Notable for the following components:   Lactic Acid, Venous 7.9 (*)    All other components within normal limits  I-STAT VENOUS BLOOD GAS, ED - Abnormal; Notable for the following components:   pCO2, Ven 35.1 (*)    pO2, Ven 114 (*)    Bicarbonate 15.9 (*)    TCO2 17 (*)    Acid-base deficit 10.0 (*)    Sodium 129 (*)    Calcium , Ion 0.95 (*)    HCT 30.0 (*)    Hemoglobin 10.2 (*)    All other components within normal limits  TROPONIN I (HIGH SENSITIVITY) - Abnormal; Notable for the following components:   Troponin I (High Sensitivity) 57 (*)    All other components within normal limits  TROPONIN I (HIGH SENSITIVITY) - Abnormal; Notable for the following components:   Troponin I (High Sensitivity) 23 (*)    All other components within normal limits  CULTURE, BLOOD (ROUTINE X 2)  CULTURE, BLOOD (ROUTINE X 2)  RESP PANEL BY RT-PCR (RSV, FLU A&B, COVID)  RVPGX2  MRSA NEXT GEN BY PCR, NASAL  URINALYSIS, W/ REFLEX TO CULTURE (INFECTION SUSPECTED)  GAMMA GT  HEMOGLOBIN A1C  I-STAT VENOUS BLOOD GAS, ED  I-STAT CG4 LACTIC ACID, ED    EKG EKG Interpretation Date/Time:  Tuesday Dec 06 2023 02:14:40 EDT Ventricular Rate:  135 PR Interval:    QRS Duration:  90 QT Interval:  375 QTC Calculation: 563 R Axis:   81  Text Interpretation: Atrial fibrillation Low voltage, precordial leads Borderline repolarization abnormality Prolonged QT interval No significant  change was found Confirmed by Earma Gloss [phone]) on 12/06/2023 2:43:40 AM  Radiology DG Chest Portable 1 View Result Date: 12/06/2023 CLINICAL DATA:  Shortness of breath and respiratory distress EXAM: PORTABLE CHEST 1 VIEW COMPARISON:  11/01/2018 FINDINGS: Cardiac shadow is enlarged. Dialysis catheter is again noted. Lungs are clear. No bony abnormality is noted. IMPRESSION: Stable cardiomegaly.  No acute abnormality noted. Electronically Signed   By: Violeta Grey M.D.   On: 12/06/2023 02:31    Procedures .Critical Care  Performed by: Earma Gloss, MD Authorized by: Earma Gloss, MD   Critical care provider statement:    Critical care time (minutes):  60   Critical care time was exclusive of:  Separately billable procedures and treating other patients   Critical care was necessary to treat or prevent imminent or life-threatening deterioration of the following conditions:  Shock and sepsis   Critical care was time spent personally by me on the following activities:  Development of treatment plan with patient or surrogate, discussions with consultants, evaluation of patient's response to treatment, examination of patient, ordering and review of laboratory studies, ordering and review of radiographic studies, ordering and performing treatments and interventions, pulse oximetry, re-evaluation of patient's condition, review of old charts, blood draw for specimens and obtaining history from patient or surrogate   I assumed direction of critical care for this patient from another provider in my specialty: no     Care discussed with: admitting provider       Medications Ordered in ED Medications  lactated ringers  bolus 500 mL (has no administration in time range)    ED Course/ Medical Decision Making/ A&P                                 Medical Decision Making Amount and/or Complexity of Data Reviewed Independent Historian: EMS Labs: ordered. Decision-making details documented  in ED Course. Radiology: ordered and independent interpretation performed. Decision-making details documented in ED Course. ECG/medicine tests: ordered and independent interpretation performed. Decision-making details documented in ED Course.  Risk Prescription drug management. Decision regarding hospitalization.   Patient with shortness of breath, hypotension, chest pain and neck pain.  Came on suddenly while he was using chemicals.  On arrival he has increased work of breathing but clear lungs.  Hypotension in the 90s.  Rapid A-fib in the 140s.  Did miss dialysis today.  Broad workup pursued.  Recent admission for bacteremia so blood cultures obtained as well as lactic acid.  He feels like he is full of fluid but his chest x-ray is clear. He has chronic wounds to his sacrum and lower extremities.  Given his hypotension and elevated lactate, there is concern for sepsis. With known low ejection fraction, judicious IV fluids given as well as IV antibiotics after cultures are obtained.  X-ray negative for pneumonia.  Labs remarkable for lactic acidosis. EKG without acute ischemia.  Will avoid Cardizem  given his low ejection fraction.  States he cannot take amiodarone  due to lung toxicity. Can consider digoxin .  Placed on BiPAP for work of breathing.  Septic workup initiated.  Also some concern for possible cardiogenic etiology of his shock.  Low-dose Levophed  begun after judicious IV fluids.  CT will attempt to be obtained to evaluate for possible source of sepsis.  Work of breathing improved on BiPAP.  ABG without significant CO2 retention.  Will continue antibiotics and vasopressors.  Attempt obtain  CT scan to further evaluate for source of possible infection.  Admission to ICU discussed with Dr. Charon Copper.       Final Clinical Impression(s) / ED Diagnoses Final diagnoses:  Lactic acidosis  Shock [facility])    Rx / DC Orders ED Discharge Orders     None         Zyion Doxtater,  Mara [place], MD 12/06/23 [phone]

## 2023-12-06 NOTE — TOC CM/SW Note (Signed)
 Transition of Care [facility]) - Inpatient Brief Assessment   Patient Details  Name: Phillip Young MRN: [id number] Date of Birth: 1985/12/22  Transition of Care [facility]) CM/SW Contact:    Tom-Johnson, Angelique Ken, RN Phone Number: 12/06/2023, 2:57 PM   Clinical Narrative:  Patient presented to the ED with worsening Respiratory Distress, BLE Edema, Neck and Chest pain. Admitted with Shock, on IV abx, Levo, Eliquis . Patient with hx of ESRD, started on CRRT, Nephrology following.  Patient known by this CM form recent admissions.    Patient not Medically ready for discharge.  CM will continue to follow as patient progresses with care towards discharge.         Transition of Care Asessment:

## 2023-12-06 NOTE — H&P (Signed)
 NAME:  Phillip Young, MRN:  [id number], DOB:  [birth year], LOS: 0 ADMISSION DATE:  12/06/2023, CONSULTATION DATE:  5/13/2 REFERRING MD:  EDP, CHIEF COMPLAINT:  hypotension and hypoxemia   History of Present Illness:  38 yo chronically ill male presented to ED with shortness of breath, increased wob and hypotension.  Pt seen in ED by myself fairly comfortable now on NIV. He is hypotensive req levo at 6mcg for now. Pt states takes midodrine  "all the time" especially with dialysis. He reported cp that has improved since NIV placement. Pt states last dialysis session was done on Friday, he missed Monday.   Yesterday pt was short of breath and called his clinics to let them know. He was advised to go to the ED at that time but declined. He was also requesting to have dialysis 5 days a week for volume removal. He states that his LE are progressively more swollen over the past couple weeks. He endorses his breathing gradually getting worse as well, can tell he is "filling up with fluid". Denies any fever, chills, n/v + diarrhea yesterday evening. Pt also states he has been having weeping from his L foot. He has a noted wound on the dorsal aspect of the food with oozing serosanguinous fluid.  Both legs are painful to touch. He states that his bedsores have been worsening since his last admission as well.    BNP elevated at 1,176 trop 57. Bicarb 18, lactate 4.8->7.9. CT scans ordered but pt unable to lay flat at this time.  Pertinent  Medical History  Esrd mwf (misses sessions) Severe morbid obesity LLE cellulitis with wound Sacral decub Chronic macrocytic anemia Chronic afib on chronic a/c  Significant Hospital Events: Including procedures, antibiotic start and stop dates in addition to other pertinent events   Admitted to ICU 5/13  Interim History / Subjective:    Objective    Blood pressure (!) 110/99, pulse 69, temperature 97.6 F (36.4 C), temperature source Axillary, resp. rate 20, height  6' (1.829 m), weight (!) 181.4 kg, SpO2 97%.    FiO2 (%):  [50 %] 50 %   Intake/Output Summary (Last 24 hours) at 12/06/2023 0524 Last data filed at 12/06/2023 0421 Gross per 24 hour  Intake 500 ml  Output --  Net 500 ml   Filed Weights   12/06/23 4098  Weight: (!) 181.4 kg    Examination: General: morbidly obese, laying in bed with NIV in place HENT: ncat eomi mmmp Lungs: distant but seemingly clear Cardiovascular: irreg irreg, distant Abdomen: obese, bs distant Extremities: +++ pitting edema with weeping on dorsal aspect L foot, edema in b/l up and pannus Neuro: alert, oriented, no focal deficits appreciated GU: deferred  Resolved problem list   Assessment and Plan  Shock, undifferentiated septic and cardiogenic suspected Acute on chronic hypoxic resp failure on NIV Acute on chronic HFrEF with R heart failure as well LLE wound Pulmonary edema, acute Lymphedema Sepsis, unknown source LLE/sacral wounds?/other Lactic acidosis Esrd MWF T2dm with hyperglycemia Alk phos elevated Coagulopathy, INR 3.5 hypothyroidism -titrate vasopressor, map goal >60 or sbp >90 -cont midodrine  per home schedule 30mg  tid -echo, limited ordered -trend trop -ask nephro to see pt for volume removal -given empiric abx in ED, f/u cx. Will likely need id with h/o MDRO -wound care for L LE, also ordering ct +/- MRI of that found based on what is found  -agree with NIV use to support respirations, may need ETT to obtain imaging and lay flat  but would like to avoid at this time.  -do not feel u/s will be useful due to body habitus.  -will likely need HF, however pt has expressed frustration with service, stating he does not want to see them. During my conversation with him he seems to lack insight into his disease issue and severity -holding eliquis  should procedure be necessary, has h/o HITT so no heparin  gtt, risks vs benefits advised -check ggt for elevated alk phos -trend trop and lactate,  check ck as well -cont levothyroxine  -ssi   Best Practice (right click and "Reselect all SmartList Selections" daily)   Diet/type: NPO w/ oral meds DVT prophylaxis SCD Pressure ulcer(s): present on admission  GI prophylaxis: PPI Lines: N/A Foley:  N/A Code Status:  full code Last date of multidisciplinary goals of care discussion [-- pending]  Labs   CBC: Recent Labs  Lab 12/06/23 0216 12/06/23 0323  WBC 11.9*  --   NEUTROABS 10.3*  --   HGB 10.7* 10.2*  HCT 33.8* 30.0*  MCV 100.3*  --   PLT 262  --     Basic Metabolic Panel: Recent Labs  Lab 12/06/23 0216 12/06/23 0323  NA 132* 129*  K 4.7 4.4  CL 90*  --   CO2 18*  --   GLUCOSE 83  --   BUN 49*  --   CREATININE 10.83*  --   CALCIUM  9.3  --    GFR: Estimated Creatinine Clearance: 15.6 mL/min (A) (by C-G formula based on SCr of 10.83 mg/dL (H)). Recent Labs  Lab 12/06/23 0216 12/06/23 0232 12/06/23 0328  WBC 11.9*  --   --   LATICACIDVEN  --  4.8* 7.9*    Liver Function Tests: Recent Labs  Lab 12/06/23 0216  AST 25  ALT 15  ALKPHOS 239*  BILITOT 2.3*  PROT 7.8  ALBUMIN  3.3*   No results for input(s): "LIPASE", "AMYLASE" in the last 168 hours. No results for input(s): "AMMONIA" in the last 168 hours.  ABG    Component Value Date/Time   PHART 7.31 (L) 03/08/2023 2030   PCO2ART 37 03/08/2023 2030   PO2ART 127 (H) 03/08/2023 2030   HCO3 15.9 (L) 12/06/2023 0323   TCO2 17 (L) 12/06/2023 0323   ACIDBASEDEF 10.0 (H) 12/06/2023 0323   O2SAT 98 12/06/2023 0323     Coagulation Profile: Recent Labs  Lab 12/06/23 0430  INR 3.5*    Cardiac Enzymes: No results for input(s): "CKTOTAL", "CKMB", "CKMBINDEX", "TROPONINI" in the last 168 hours.  HbA1C: Hemoglobin A1C  Date/Time Value Ref Range Status  06/21/2023 03:09 PM 4.6 4.0 - 5.6 % Final  11/05/2022 11:44 AM 5.6 4.0 - 5.6 % Final   Hgb A1c MFr Bld  Date/Time Value Ref Range Status  03/26/2022 07:58 PM 5.3 4.8 - 5.6 % Final     Comment:    (NOTE) Pre diabetes:          5.7%-6.4%  Diabetes:              >6.4%  Glycemic control for   <7.0% adults with diabetes   08/19/2021 05:40 AM 5.9 (H) 4.8 - 5.6 % Final    Comment:    (NOTE)         Prediabetes: 5.7 - 6.4         Diabetes: >6.4         Glycemic control for adults with diabetes: <7.0     CBG: No results for input(s): "GLUCAP" in the last  168 hours.  Review of Systems:   As per HPI  Past Medical History:  He,  has a past medical history of Acute on chronic respiratory failure with hypoxia ([name]) (04/21/2021), Acute on chronic systolic (congestive) heart failure ([name]) (02/26/2020), Amiodarone  toxicity, Anemia, Atrial flutter ([name]), Biventricular congestive heart failure ([name]), Chronic hypoxemic respiratory failure ([name]), Class 3 severe obesity due to excess calories with serious comorbidity and body mass index (BMI) of 50.0 to 59.9 in adult (02/26/2020), Essential hypertension (02/26/2020), GERD without esophagitis (02/26/2020), Hidradenitis suppurativa (02/26/2020), NICM (nonischemic cardiomyopathy) ([name]), Obesity hypoventilation syndrome ([name]), OSA (obstructive sleep apnea), PAF (paroxysmal atrial fibrillation) ([name]), Pneumonia, and Prediabetes (02/26/2020).   Surgical History:   Past Surgical History:  Procedure Laterality Date   ABSCESS DRAINAGE     AV FISTULA PLACEMENT Left 08/21/2021   Procedure: LEFT ARM ARTERIOVENOUS (AV) FISTULA.;  Surgeon: Margherita Shell, MD;  Location: [facility] OR;  Service: Vascular;  Laterality: Left;   CARDIAC CATHETERIZATION     CARDIOVERSION N/A 10/09/2021   Procedure: CARDIOVERSION;  Surgeon: Darlis Eisenmenger, MD;  Location: [facility] ENDOSCOPY;  Service: Cardiovascular;  Laterality: N/A;   CARDIOVERSION N/A 05/28/2022   Procedure: CARDIOVERSION;  Surgeon: Darlis Eisenmenger, MD;  Location: [facility] ENDOSCOPY;  Service: Cardiovascular;  Laterality: N/A;   CARDIOVERSION N/A 06/07/2022   Procedure: CARDIOVERSION;  Surgeon: Darlis Eisenmenger, MD;  Location: [facility] ENDOSCOPY;  Service: Cardiovascular;  Laterality: N/A;   IR FLUORO GUIDE CV LINE RIGHT  03/10/2021   IR FLUORO GUIDE CV LINE RIGHT  04/22/2021   IR FLUORO GUIDE CV LINE RIGHT  08/20/2021   IR FLUORO GUIDE CV LINE RIGHT  03/01/2023   IR FLUORO GUIDE CV LINE RIGHT  03/09/2023   IR FLUORO GUIDE CV LINE RIGHT  05/30/2023   IR FLUORO GUIDE CV LINE RIGHT  11/10/2023   IR PTA VENOUS EXCEPT DIALYSIS CIRCUIT  03/09/2023   IR REMOVAL TUN CV CATH W/O FL  05/26/2023   IR REMOVAL TUN CV CATH W/O FL  11/07/2023   IR REMOVE CV FIBRIN SHEATH  03/09/2023   IR US  GUIDE VASC ACCESS RIGHT  03/10/2021   IR US  GUIDE VASC ACCESS RIGHT  04/22/2021   IR US  GUIDE VASC ACCESS RIGHT  05/30/2023   IR US  GUIDE VASC ACCESS RIGHT  11/10/2023   RIGHT HEART CATH N/A 03/06/2021   Procedure: RIGHT HEART CATH;  Surgeon: Mardell Shade, MD;  Location: [facility] INVASIVE CV LAB;  Service: Cardiovascular;  Laterality: N/A;   RIGHT/LEFT HEART CATH AND CORONARY ANGIOGRAPHY N/A 03/04/2020   Procedure: RIGHT/LEFT HEART CATH AND CORONARY ANGIOGRAPHY;  Surgeon: Darlis Eisenmenger, MD;  Location[facility] INVASIVE CV LAB;  Service: Cardiovascular;  Laterality: N/A;   TEE WITHOUT CARDIOVERSION N/A 05/05/2021   Procedure: TRANSESOPHAGEAL ECHOCARDIOGRAM (TEE);  Surgeon: Darlis Eisenmenger, MD;  Location: [facility] ENDOSCOPY;  Service: Cardiovascular;  Laterality: N/A;   TEMPORARY DIALYSIS CATHETER  03/06/2021   Procedure: TEMPORARY DIALYSIS CATHETER;  Surgeon: Mardell Shade, MD;  Location: [facility] INVASIVE CV LAB;  Service: Cardiovascular;;     Social History:   reports that he quit smoking about 6 years ago. His smoking use included cigarettes. He has been exposed to tobacco smoke. He has quit using smokeless tobacco. He reports that he does not drink alcohol  and does not use drugs.   Family History:  His family history includes Drug abuse in his father; Heart disease in his mother; Hypertension in his mother; Pulmonary Hypertension in his  mother.   Allergies  Allergies  Allergen Reactions   Amiodarone  Other (See Comments)    Suspicion for amiodarone  lung/hepatotoxicity    Coreg  [Carvedilol ] Shortness Of Breath and Diarrhea    Wheezing    Heparin  Other (See Comments)    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, SRA positive   Metoprolol  Other (See Comments)    near syncope   Amoxicillin  Other (See Comments)    Was hospitalized    Other Swelling and Other (See Comments)    Steroids Fluid seeping out of legs    Tramadol       Home Medications  Prior to Admission medications   Medication Sig Start Date End Date Taking? Authorizing Provider  acetaminophen  (TYLENOL ) 500 MG tablet Take 2 tablets (1,000 mg total) by mouth every 6 (six) hours. Patient taking differently: Take 1,000 mg by mouth every 4 (four) hours as needed for mild pain (pain score 1-3) or moderate pain (pain score 4-6). 09/06/23   Malen Scudder, DO  apixaban  (ELIQUIS ) 5 MG TABS tablet Take 1 tablet (5 mg total) by mouth 2 (two) times daily. 06/03/23   Carleen Chary, DO  vitamin D3 (CHOLECALCIFEROL ) 25 MCG tablet Take 0.5 tablets (500 Units total) by mouth daily. 11/12/23   [name], MD  cinacalcet  (SENSIPAR ) 30 MG tablet Take 1 tablet (30 mg total) by mouth every dialysis. Patient taking differently: Take 30 mg by mouth every Monday, Wednesday, and Friday. 09/12/23   Sheree Dieter, MD  clindamycin  (CLINDAGEL ) 1 % gel Apply topically 2 (two) times daily. Patient taking differently: Apply 1 Application topically 2 (two) times daily. 10/06/23   Carleen Chary, DO  Darbepoetin Alfa  (ARANESP ) 150 MCG/0.3ML SOSY injection To be given during dialysis by [facility] per Nephrology. Not to be given to patient at SNF. 09/06/23   Sheree Dieter, MD  digoxin  (LANOXIN ) 0.125 MG tablet Take 1 tablet (0.125 mg total) by mouth 3 (three) times a week. Monday, Wednesday, Friday (dialysis days) 11/14/23   [name], MD  fentaNYL  (DURAGESIC ) 12 MCG/HR  Place 1 patch onto the skin every 3 (three) days. 11/14/23   [name], MD  levothyroxine  (SYNTHROID ) 25 MCG tablet Take 1 tablet (25 mcg total) by mouth daily at 6 (six) AM. 11/12/23   [name], MD  midodrine  (PROAMATINE ) 10 MG tablet Take 3 tablets (30 mg total) by mouth 3 (three) times daily with meals. Patient not taking: Reported on 11/03/2023 06/20/23   Setzer, Sandra J, PA-C  naloxone [facility]) 2 MG/2ML injection Inject 2 mg into the skin as needed. Patient not taking: Reported on 11/03/2023    [provider]  Oxycodone  HCl 10 MG TABS Take 1 tablet (10 mg total) by mouth every 6 (six) hours as needed. 12/02/23   Carleen Chary, DO  pregabalin  (LYRICA ) 50 MG capsule Take 1 capsule (50 mg total) by mouth daily. Take one capsule daily. 11/12/23   [name], MD  sevelamer  carbonate (RENVELA ) 800 MG tablet Take 2 tablets (1,600 mg total) by mouth 3 (three) times daily with meals. 11/12/23   [name], MD  tirzepatide [facility]) 2.5 MG/0.5ML injection vial Inject 2.5 mg into the skin once a week. Patient not taking: Reported on 10/11/2023 08/25/23   Sheree Dieter, MD  vancomycin  IVPB Inject 1,000 mg into the vein every Monday, Wednesday, and Friday with hemodialysis. Indication:  MRSE + E faecalis bacteremia Last Day of Therapy:  12/18/23 Labs - weekly w/ HD:  CBC/D, BMP, and vancomycin  trough; ESR and CRP Method of administration:Elastomeric or per HD protocol -  to be given at the HD center 11/09/23 12/18/23  Ernie Heal, Jerelyn Money, MD  venlafaxine  XR (EFFEXOR  XR) 37.5 MG 24 hr capsule Take 1 capsule (37.5 mg total) by mouth daily. 11/12/23 11/11/24  [name], MD     Critical care time: 

## 2023-12-06 NOTE — Progress Notes (Signed)
 Pt. Was  brought in by ems for resp. Distress. Pt. Brought home bipap and was started on it with 6L being bled in. MD made aware. VS are stable at this time

## 2023-12-06 NOTE — Sepsis Progress Note (Signed)
 Elink monitoring for the code sepsis protocol.   Notified bedside nurse of need to order repeat lactic acid.

## 2023-12-06 NOTE — Progress Notes (Signed)
 eLink Physician-Brief Progress Note Patient Name: Zigmond Disch DOB: [birth year] MRN: [id number]   Date of Service  12/06/2023  HPI/Events of Note  37/M with ESRD on HD, who had missed dialysis, presenting with shortness of breath and hypotension.  Pt is chronically on midodrine .  Pt was placed on NIV and started levophed  as well as empiric antibiotics.  BP 94/65, HR 124, RR 22  eICU Interventions  Renal consult for CRRT.  Continue levophed .  Continue to trend lactate.  Continue home dose of midodrine .  Insulin  for glucose control.  PPI for GI prophylaxis.  SCDs for DVT prophylaxis.       Intervention Category Evaluation Type: New Patient Evaluation  Lanell Pinta 12/06/2023, 6:23 AM

## 2023-12-06 NOTE — Inpatient Diabetes Management (Signed)
 Inpatient Diabetes Program Recommendations  AACE/ADA: New Consensus Statement on Inpatient Glycemic Control (2015)  Target Ranges:  Prepandial:   less than 140 mg/dL      Peak postprandial:   less than 180 mg/dL (1-2 hours)      Critically ill patients:  140 - 180 mg/dL   Lab Results  Component Value Date   GLUCAP 61 (L) 12/06/2023   HGBA1C 4.6 06/21/2023    Review of Glycemic Control  Latest Reference Range & Units 12/06/23 06:10 12/06/23 07:56 12/06/23 11:48 12/06/23 12:01 12/06/23 12:08  Glucose-Capillary 70 - 99 mg/dL 324 (H) 90 57 (L) 57 (L) 61 (L)   Diabetes history: DM2 Outpatient Diabetes medications:  None noted Current orders for Inpatient glycemic control:  Novolog  0-9 units q 4 hours  Inpatient Diabetes Program Recommendations:    Note low blood sugars.  Consider reducing Novolog  to 0-6 units q 4 hours (CBG's currently low) and may need addition of dextrose  in IV fluids?   Thanks,  Josefa Ni, RN, BC-ADM Inpatient Diabetes Coordinator Pager [phone]  (8a-5p)

## 2023-12-06 NOTE — Progress Notes (Signed)
  Interdisciplinary Goals of Care Family Meeting   Date carried out:: 12/06/2023  Location of the meeting: Bedside  Member's involved: Physician, Nurse Practitioner, and Bedside Registered Nurse  Durable Power of Attorney or acting medical decision maker: pt    Discussion: We discussed goals of care for Thomasina Fletcher at his request.  Pt requesting NOT to be intubated if any decompensation, but ok with CPR and other ACLS measures.  Wants to use his NIV for any respiratory distress  Code status: Full Code > not able to change specifically to just DNI.    Disposition: Continue current acute care   Time spent for the meeting: 10 mins      Early Glisson, MSN, AG-ACNP-BC [facility] 12/06/2023, 10:30 AM  See Amion for pager If no response to pager , please call [phone] until 7pm After 7:00 pm call Elink  [phone]

## 2023-12-06 NOTE — Progress Notes (Signed)
 Pt home medications given to mother to bring back home. That included 4 Fentanyl  patches. They were counted in front of pt and pts mother prior to given them to her.

## 2023-12-06 NOTE — Consult Note (Signed)
 Renal Service Consult Note [facility]  Phillip Young 12/06/2023 Phillip Sandifer, MD Requesting Physician: Dr. Charon Copper, J.   Reason for Consult: ESRD patient with shock and severe volume overload HPI: The patient is a 38 y.o. year-old w/ PMH as below who presented to ED early this morning with respiratory distress.  Patient arrived on nonrebreather at 15 LPM O2 complaining of chest pain and neck pain.  Patient had increased his home oxygen  from 2 to 4 to 6 L and called EMS.  O2 sats were in the 60s, no cough no fever.  Patient has missed 1 or more dialysis sessions in the last week.  Was recently hospitalized for bacteremia.  In the ED blood pressure 94/68, heart rate 113-132,  RR 20-33, temp 98.7.  Chest x-ray shows chronically enlarged heart and no obvious edema.  Labs showed K+ 4.7 creatinine 10.8, albumin  3.3, BNP 1176, lactic acid 4.8 then greater then 9.0, Hgb 10.2.  Patient tested positive for COVID.  Patient was admitted to ICU.  We are asked to see for CRRT   Pt seen in ICU.  Levophed  drip is running at 5-8 mics per minute, also received IV antibiotics with cefepime , Flagyl  and Zyvox .  Patient is a bit lethargic, denies any current complaints.    ROS - denies CP, no joint pain, no HA, no blurry vision, no rash, no diarrhea, no nausea/ vomiting  PMH: Chronic respiratory failure on home O2 History of amiodarone  toxicity Anemia Atrial flutter/atrial fibrillation Biventricular CHF ESRD on HD Severe obesity Hypertension GERD Hidradenitis suppurativa Nonischemic cardiomyopathy OHS OSA Prediabetes  Past Surgical History  Past Surgical History:  Procedure Laterality Date   ABSCESS DRAINAGE     AV FISTULA PLACEMENT Left 08/21/2021   Procedure: LEFT ARM ARTERIOVENOUS (AV) FISTULA.;  Surgeon: Margherita Shell, MD;  Location: [facility] OR;  Service: Vascular;  Laterality: Left;   CARDIAC CATHETERIZATION     CARDIOVERSION N/A 10/09/2021   Procedure: CARDIOVERSION;   Surgeon: Darlis Eisenmenger, MD;  Location: [facility] ENDOSCOPY;  Service: Cardiovascular;  Laterality: N/A;   CARDIOVERSION N/A 05/28/2022   Procedure: CARDIOVERSION;  Surgeon: Darlis Eisenmenger, MD;  Location: [facility] ENDOSCOPY;  Service: Cardiovascular;  Laterality: N/A;   CARDIOVERSION N/A 06/07/2022   Procedure: CARDIOVERSION;  Surgeon: Darlis Eisenmenger, MD;  Location: [facility] ENDOSCOPY;  Service: Cardiovascular;  Laterality: N/A;   IR FLUORO GUIDE CV LINE RIGHT  03/10/2021   IR FLUORO GUIDE CV LINE RIGHT  04/22/2021   IR FLUORO GUIDE CV LINE RIGHT  08/20/2021   IR FLUORO GUIDE CV LINE RIGHT  03/01/2023   IR FLUORO GUIDE CV LINE RIGHT  03/09/2023   IR FLUORO GUIDE CV LINE RIGHT  05/30/2023   IR FLUORO GUIDE CV LINE RIGHT  11/10/2023   IR PTA VENOUS EXCEPT DIALYSIS CIRCUIT  03/09/2023   IR REMOVAL TUN CV CATH W/O FL  05/26/2023   IR REMOVAL TUN CV CATH W/O FL  11/07/2023   IR REMOVE CV FIBRIN SHEATH  03/09/2023   IR US  GUIDE VASC ACCESS RIGHT  03/10/2021   IR US  GUIDE VASC ACCESS RIGHT  04/22/2021   IR US  GUIDE VASC ACCESS RIGHT  05/30/2023   IR US  GUIDE VASC ACCESS RIGHT  11/10/2023   RIGHT HEART CATH N/A 03/06/2021   Procedure: RIGHT HEART CATH;  Surgeon: Mardell Shade, MD;  Location: [facility] INVASIVE CV LAB;  Service: Cardiovascular;  Laterality: N/A;   RIGHT/LEFT HEART CATH AND CORONARY ANGIOGRAPHY N/A 03/04/2020   Procedure: RIGHT/LEFT  HEART CATH AND CORONARY ANGIOGRAPHY;  Surgeon: Darlis Eisenmenger, MD;  Location: [facility] INVASIVE CV LAB;  Service: Cardiovascular;  Laterality: N/A;   TEE WITHOUT CARDIOVERSION N/A 05/05/2021   Procedure: TRANSESOPHAGEAL ECHOCARDIOGRAM (TEE);  Surgeon: Darlis Eisenmenger, MD;  Location: [facility] ENDOSCOPY;  Service: Cardiovascular;  Laterality: N/A;   TEMPORARY DIALYSIS CATHETER  03/06/2021   Procedure: TEMPORARY DIALYSIS CATHETER;  Surgeon: Mardell Shade, MD;  Location: [facility] INVASIVE CV LAB;  Service: Cardiovascular;;   Family History  Family History  Problem Relation Age of Onset    Heart disease Mother    Hypertension Mother    Pulmonary Hypertension Mother    Drug abuse Father        died due to Heroin overdose   Social History  reports that he quit smoking about 6 years ago. His smoking use included cigarettes. He has been exposed to tobacco smoke. He has quit using smokeless tobacco. He reports that he does not drink alcohol  and does not use drugs. 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 Authorizing Provider  apixaban  (ELIQUIS ) 5 MG TABS tablet Take 1 tablet (5 mg total) by mouth 2 (two) times daily. 06/03/23  Yes Juberg, Veryl Gottron, DO  clindamycin  (CLINDAGEL ) 1 % gel Apply topically 2 (two) times daily. Patient taking differently: Apply 1 Application topically 2 (two) times daily. 10/06/23  Yes Carleen Chary, DO  Darbepoetin Alfa  (ARANESP ) 150 MCG/0.3ML SOSY injection To be given during dialysis by [facility] per Nephrology. Not to be given to patient at SNF. 09/06/23  Yes Sheree Dieter, MD  digoxin  (LANOXIN ) 0.125 MG tablet Take 1 tablet (0.125 mg total) by mouth 3 (three) times a week. Monday, Wednesday, Friday (dialysis days) 11/14/23  Yes [name], MD  fentaNYL  (DURAGESIC ) 12 MCG/HR Place 1 patch onto the skin every 3 (three) days. 11/14/23  Yes [name], MD  levothyroxine  (SYNTHROID ) 25 MCG tablet Take 1 tablet (25 mcg total) by mouth daily at 6 (six) AM. 11/12/23  Yes [name], MD  midodrine   (PROAMATINE ) 10 MG tablet Take 3 tablets (30 mg total) by mouth 3 (three) times daily with meals. 06/20/23  Yes Setzer, Hamp Levine, PA-C  Oxycodone  HCl 10 MG TABS Take 1 tablet (10 mg total) by mouth every 6 (six) hours as needed. Patient taking differently: Take 10 mg by mouth every 6 (six) hours. 12/02/23  Yes Juberg, Veryl Gottron, DO  pregabalin  (LYRICA ) 50 MG capsule Take 1 capsule (50 mg total) by mouth daily. Take one capsule daily. 11/12/23  Yes [name], MD  sevelamer  carbonate (RENVELA ) 800 MG tablet Take 2 tablets (1,600 mg total) by mouth 3 (three) times daily with meals. Patient taking differently: Take 2,400 mg by mouth 3 (three) times daily with meals. 11/12/23  Yes [name], MD  vancomycin  IVPB Inject 1,000 mg into the vein every Monday, Wednesday, and Friday with hemodialysis. Indication:  MRSE +  E faecalis bacteremia Last Day of Therapy:  12/18/23 Labs - weekly w/ HD:  CBC/D, BMP, and vancomycin  trough; ESR and CRP Method of administration:Elastomeric or per HD protocol - to be given at the HD center 11/09/23 12/18/23 Yes Ernie Heal, Jerelyn Money, MD  venlafaxine  XR (EFFEXOR  XR) 37.5 MG 24 hr capsule Take 1 capsule (37.5 mg total) by mouth daily. 11/12/23 11/11/24 Yes [name], MD  vitamin D3 (CHOLECALCIFEROL ) 25 MCG tablet Take 0.5 tablets (500 Units total) by mouth daily. 11/12/23  Yes [name], MD  acetaminophen  (TYLENOL ) 500 MG tablet Take 2 tablets (1,000 mg total) by mouth every 6 (six) hours. Patient not taking: Reported on 12/06/2023 09/06/23   Malen Scudder, DO  cinacalcet  (SENSIPAR ) 30 MG tablet Take 1 tablet (30 mg total) by mouth every dialysis. Patient taking differently: Take 30 mg by mouth every Monday, Wednesday, and Friday. 09/12/23   Youssefzadeh, Keon, MD  naloxone (NARCAN) 2 MG/2ML injection Inject 2 mg into the skin as needed. Patient not taking: Reported on 11/03/2023    [provider]  tirzepatide (ZEPBOUND) 2.5 MG/0.5ML injection vial Inject 2.5 mg into  the skin once a week. Patient not taking: Reported on 10/11/2023 08/25/23   Sheree Dieter, MD     Vitals:   12/06/23 0830 12/06/23 0845 12/06/23 0900 12/06/23 1000  BP: (!) 115/100 (!) 116/103 127/64 (!) 121/90  Pulse:  (!) 118 (!) 114   Resp: (!) 30 (!) 23 (!) 31   Temp:      TempSrc:      SpO2:  99% 97%   Weight:      Height:       Exam Gen alert, looks acutely and chronically ill Face looks cyanotic, on nasal O2 not in respiratory distress No rash, cyanosis or gangrene Sclera anicteric, throat clear  No jvd or bruits Chest clear bilat to bases, no rales/ wheezing RRR no RG Abd soft morbidly obese ntnd no mass or ascites +bs GU normal male MS no joint effusions or deformity Ext diffuse 2+ bilateral pitting edema, tender to the touch but not erythematous Neuro is sluggish, Ox 3 , nf     TDC intact     Renal-related home meds: Sensipar  30 mg MWF Midodrine  30 mg 3 times daily Renvela  2 AC 3 times daily    OP HD: MWF [facility] From April 2025 -> 4.5h  B400   155.5kg   TDC   NO heparin  (HIT)    Assessment/ Plan: Acute on chronic hypoxic and hypercarbic resp failure - prob some degree of pulm edema. Plan is for CRRT w/ UF 100- 200 cc/hr as tolerated.  Shock: possibly cardiogenic, hx bivent CHF. Getting levo support  Chronic vol overload: big problem in OP setting ESRD: on HD MWF. Plan is for CRRT as above.  Chronic hypotensino: takes 30mg  tid of midodrine  at home. Pressor support here.  Anemia of esrd: Hb 10, follow Secondary hyperparathyroidism: CCa in range, follow, cont binders w/ meals.    Phillip Poag  MD [facility] 12/06/2023, 11:40 AM  Recent Labs  Lab 12/06/23 0216 12/06/23 0323  HGB 10.7* 10.2*  ALBUMIN  3.3*  --   CALCIUM  9.3  --   CREATININE 10.83*  --   K 4.7 4.4   Inpatient medications:  apixaban   5 mg Oral BID   Chlorhexidine  Gluconate Cloth  6 each Topical Daily   [START ON 12/07/2023] digoxin   0.125 mg Oral Once per day on Monday Wednesday Friday    fentaNYL   1 patch Transdermal Q72H   insulin  aspart  0-9 Units Subcutaneous Q4H   levothyroxine   25 mcg Oral Q0600   midodrine   30 mg Oral TID WC   pantoprazole   40 mg Oral Daily   pregabalin   50 mg Oral Daily   venlafaxine  XR  37.5 mg Oral Daily    sodium chloride      norepinephrine  (LEVOPHED ) Adult infusion 5 mcg/min (12/06/23 0907)   prismasol  BGK 4/2.5     prismasol  BGK 4/2.5     prismasol  BGK 4/2.5     docusate sodium , ondansetron  (ZOFRAN ) IV, mouth rinse, oxyCODONE , polyethylene glycol

## 2023-12-07 DIAGNOSIS — I4891 Unspecified atrial fibrillation: Secondary | ICD-10-CM | POA: Diagnosis not present

## 2023-12-07 DIAGNOSIS — N186 End stage renal disease: Secondary | ICD-10-CM | POA: Diagnosis not present

## 2023-12-07 DIAGNOSIS — U071 COVID-19: Secondary | ICD-10-CM

## 2023-12-07 DIAGNOSIS — J9622 Acute and chronic respiratory failure with hypercapnia: Secondary | ICD-10-CM

## 2023-12-07 DIAGNOSIS — I5020 Unspecified systolic (congestive) heart failure: Secondary | ICD-10-CM

## 2023-12-07 DIAGNOSIS — Z992 Dependence on renal dialysis: Secondary | ICD-10-CM | POA: Diagnosis not present

## 2023-12-07 DIAGNOSIS — E119 Type 2 diabetes mellitus without complications: Secondary | ICD-10-CM

## 2023-12-07 DIAGNOSIS — A4902 Methicillin resistant Staphylococcus aureus infection, unspecified site: Secondary | ICD-10-CM

## 2023-12-07 DIAGNOSIS — R579 Shock, unspecified: Secondary | ICD-10-CM | POA: Diagnosis not present

## 2023-12-07 DIAGNOSIS — R7881 Bacteremia: Secondary | ICD-10-CM

## 2023-12-07 LAB — RENAL FUNCTION PANEL
Albumin: 3 g/dL — ABNORMAL LOW (ref 3.5–5.0)
Albumin: 2.9 g/dL — ABNORMAL LOW (ref 3.5–5.0)
Anion gap: 19 — ABNORMAL HIGH (ref 5–15)
Anion gap: 15 (ref 5–15)
BUN: 39 mg/dL — ABNORMAL HIGH (ref 6–20)
BUN: 34 mg/dL — ABNORMAL HIGH (ref 6–20)
CO2: 17 mmol/L — ABNORMAL LOW (ref 22–32)
CO2: 18 mmol/L — ABNORMAL LOW (ref 22–32)
Calcium: 9.1 mg/dL (ref 8.9–10.3)
Calcium: 8.8 mg/dL — ABNORMAL LOW (ref 8.9–10.3)
Chloride: 94 mmol/L — ABNORMAL LOW (ref 98–111)
Chloride: 97 mmol/L — ABNORMAL LOW (ref 98–111)
Creatinine, Ser: 7.5 mg/dL — ABNORMAL HIGH (ref 0.61–1.24)
Creatinine, Ser: 6.25 mg/dL — ABNORMAL HIGH (ref 0.61–1.24)
GFR, Estimated: 9 mL/min — ABNORMAL LOW (ref >60)
GFR, Estimated: 11 mL/min — ABNORMAL LOW (ref >60)
Glucose, Bld: 124 mg/dL — ABNORMAL HIGH (ref 70–99)
Glucose, Bld: 106 mg/dL — ABNORMAL HIGH (ref 70–99)
Phosphorus: 6.3 mg/dL — ABNORMAL HIGH (ref 2.5–4.6)
Phosphorus: 5.4 mg/dL — ABNORMAL HIGH (ref 2.5–4.6)
Potassium: 4.7 mmol/L (ref 3.5–5.1)
Potassium: 4.4 mmol/L (ref 3.5–5.1)
Sodium: 130 mmol/L — ABNORMAL LOW (ref 135–145)
Sodium: 130 mmol/L — ABNORMAL LOW (ref 135–145)

## 2023-12-07 LAB — BLOOD CULTURE ID PANEL (REFLEXED) - BCID2

## 2023-12-07 LAB — MAGNESIUM: Magnesium: 2.3 mg/dL (ref 1.7–2.4)

## 2023-12-07 LAB — GLUCOSE, CAPILLARY
Glucose-Capillary: 100 mg/dL — ABNORMAL HIGH (ref 70–99)
Glucose-Capillary: 119 mg/dL — ABNORMAL HIGH (ref 70–99)
Glucose-Capillary: 115 mg/dL — ABNORMAL HIGH (ref 70–99)
Glucose-Capillary: 108 mg/dL — ABNORMAL HIGH (ref 70–99)
Glucose-Capillary: 102 mg/dL — ABNORMAL HIGH (ref 70–99)
Glucose-Capillary: 141 mg/dL — ABNORMAL HIGH (ref 70–99)

## 2023-12-07 LAB — CBC
HCT: 32.4 % — ABNORMAL LOW (ref 39.0–52.0)
Hemoglobin: 10.7 g/dL — ABNORMAL LOW (ref 13.0–17.0)
MCH: 31.9 pg (ref 26.0–34.0)
MCHC: 33 g/dL (ref 30.0–36.0)
MCV: 96.7 fL (ref 80.0–100.0)
Platelets: 271 10*3/uL (ref 150–400)
RBC: 3.35 MIL/uL — ABNORMAL LOW (ref 4.22–5.81)
RDW: 16.5 % — ABNORMAL HIGH (ref 11.5–15.5)
WBC: 12.5 10*3/uL — ABNORMAL HIGH (ref 4.0–10.5)
nRBC: 0.2 % (ref 0.0–0.2)

## 2023-12-07 LAB — COMPREHENSIVE METABOLIC PANEL WITH GFR
ALT: 16 U/L (ref 0–44)
AST: 27 U/L (ref 15–41)
Albumin: 3 g/dL — ABNORMAL LOW (ref 3.5–5.0)
Alkaline Phosphatase: 187 U/L — ABNORMAL HIGH (ref 38–126)
Anion gap: 19 — ABNORMAL HIGH (ref 5–15)
BUN: 39 mg/dL — ABNORMAL HIGH (ref 6–20)
CO2: 16 mmol/L — ABNORMAL LOW (ref 22–32)
Calcium: 8.9 mg/dL (ref 8.9–10.3)
Chloride: 94 mmol/L — ABNORMAL LOW (ref 98–111)
Creatinine, Ser: 7.49 mg/dL — ABNORMAL HIGH (ref 0.61–1.24)
GFR, Estimated: 9 mL/min — ABNORMAL LOW (ref >60)
Glucose, Bld: 126 mg/dL — ABNORMAL HIGH (ref 70–99)
Potassium: 4.8 mmol/L (ref 3.5–5.1)
Sodium: 129 mmol/L — ABNORMAL LOW (ref 135–145)
Total Bilirubin: 3.4 mg/dL — ABNORMAL HIGH (ref 0.0–1.2)
Total Protein: 7.2 g/dL (ref 6.5–8.1)

## 2023-12-07 LAB — CK: Total CK: 30 U/L — ABNORMAL LOW (ref 49–397)

## 2023-12-07 LAB — TSH: TSH: 2.76 u[IU]/mL (ref 0.350–4.500)

## 2023-12-07 MED ORDER — POLYETHYLENE GLYCOL 3350 17 G PO PACK
17.0000 g | PACK | Freq: Every day | ORAL | Status: DC
  Administered 2023-12-09: 17 g via ORAL
  Filled 2023-12-07 (×7): qty 1

## 2023-12-07 MED ORDER — OXYCODONE HCL 5 MG PO TABS
5.0000 mg | ORAL_TABLET | Freq: Four times a day (QID) | ORAL | Status: DC | PRN
  Administered 2023-12-07 – 2023-12-11 (×7): 5 mg via ORAL
  Filled 2023-12-07 (×7): qty 1

## 2023-12-07 MED ORDER — ENSURE ENLIVE PO LIQD
237.0000 mL | Freq: Two times a day (BID) | ORAL | Status: DC
  Administered 2023-12-07 – 2023-12-15 (×11): 237 mL via ORAL

## 2023-12-07 MED ORDER — VANCOMYCIN HCL 1750 MG/350ML IV SOLN
1750.0000 mg | INTRAVENOUS | Status: AC
  Administered 2023-12-08: 1750 mg via INTRAVENOUS
  Filled 2023-12-07: qty 350

## 2023-12-07 MED ORDER — PROSOURCE PLUS PO LIQD
30.0000 mL | Freq: Three times a day (TID) | ORAL | Status: DC
  Administered 2023-12-07 – 2023-12-11 (×5): 30 mL via ORAL
  Filled 2023-12-07 (×8): qty 30

## 2023-12-07 MED ORDER — VANCOMYCIN HCL 10 G IV SOLR
2500.0000 mg | Freq: Once | INTRAVENOUS | Status: AC
  Administered 2023-12-07: 2500 mg via INTRAVENOUS
  Filled 2023-12-07: qty 2500

## 2023-12-07 MED ORDER — RENA-VITE PO TABS
1.0000 | ORAL_TABLET | Freq: Every day | ORAL | Status: DC
  Administered 2023-12-07 – 2023-12-14 (×8): 1 via ORAL
  Filled 2023-12-07 (×8): qty 1

## 2023-12-07 MED ORDER — OXYCODONE HCL 5 MG PO TABS
5.0000 mg | ORAL_TABLET | Freq: Once | ORAL | Status: AC
  Administered 2023-12-07: 5 mg via ORAL
  Filled 2023-12-07: qty 1

## 2023-12-07 NOTE — Progress Notes (Signed)
 RN entered room to re-assess patient post administration of PRN oxycodone  5 mg as per MAR. Patient states he continues to have 9/10 pain.   RN explains to patient that she is able to reach out to provider for more pain medicine, and explains that this is not a guarantee in receiving more pain medication.   Patient states the following "I am done fucking with these doctors." RN asks for clarification as to whether he would still want RN to reach out to provider. Patient says yes.   At this time 2051 patient appears calm and comfortable, resting in bed. HR is 109, RR 28, SPO2 96.   RN reached out to [facility].

## 2023-12-07 NOTE — Progress Notes (Signed)
 Pt refusing to turn when asked. He is refusing a bath or to have his linen changed throughout the day. He is non-compliant with fluid restrictions, constantly requesting juice. He is refusing to wear his Bipap despite becoming more lethargic throughout the day. Will continue to educate.

## 2023-12-07 NOTE — Progress Notes (Signed)
 RN entered room to assess patient. RN attempted to pupil check patient with flashlight. RN unable to do so as patient closes eyes with flash light.   RN asked patient if he wants to complete CHG bath. Patient refused.   Upon finished attempt at completing neuro assessment, patient states "you are trying to make me blind."   RN explained that flashlight used is one provided by medical ICU, and that RN is not attempting to blind patient. Patients mother at bedside recommends I go purchase a penlight.   Patient complaining of 9/10 pain, RN will provide PRN pain med as ordered.   CRRT running and patient BP 108/90 at this time.

## 2023-12-07 NOTE — Progress Notes (Addendum)
 Pharmacy Antibiotic Note  Phillip Young is a 38 y.o. male admitted on 12/06/2023 with MRSE bacteremia and COVID+. PMH of ESRD MWF, severe morbid obesity, LLE cellulitis with wound, chronic sacral decub ulcer. Reports not finishing full sessions of HD. Patient recently admitted from 4/8-4/19, found to have MRSE and E.faecalis Bacteremia. TTE was negative for valvular vegetations but not a candidate (high risk) for TEE to definitively rule out endocarditis. Was discharged on Vancomycin  qHD for 6 weeks (4/8-5/5) but pt stopped ~2wks due to leg pain.   Pharmacy has been consulted for Vancomycin  dosing. Patient refused vancomycin  in the ED and received one time doses of cefepime , linezolid , and flagyl  in the ED. Patient started on CRRT on 5/13 due to cardiogenic shock and severe volume overload.  Plan: Start Vancomycin  2500 mg (AdjBW 120kg ~20mg /kg) LD, followed by 1750mg  q24h (~15mg /kg) Monitor fever curve, clinical status Follow up bcx, LOT, de-escalation plans, renal replacement changes   Height: 6' (182.9 cm) Weight: (!) 184.3 kg (406 lb 4.9 oz) IBW/kg (Calculated) : 77.6 AdjBW 120kg  Temp (24hrs), Avg:98.1 F (36.7 C), Min:97.8 F (36.6 C), Max:98.4 F (36.9 C)  Recent Labs  Lab 12/06/23 0216 12/06/23 0232 12/06/23 0328 12/06/23 0430 12/06/23 1541 12/07/23 0551 12/07/23 0552  WBC 11.9*  --   --   --   --  12.5*  --   CREATININE 10.83*  --   --   --  9.57* 7.49* 7.50*  LATICACIDVEN  --  4.8* 7.9* >9.0*  --   --   --     Estimated Creatinine Clearance: 22.7 mL/min (A) (by C-G formula based on SCr of 7.5 mg/dL (H)).    Allergies  Allergen Reactions   Amiodarone  Other (See Comments)    Suspicion for amiodarone  lung/hepatotoxicity    Coreg  [Carvedilol ] Shortness Of Breath and Diarrhea    Wheezing    Heparin  Other (See Comments)    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, SRA positive   Metoprolol  Other (See Comments)    near syncope   Amoxicillin  Other (See Comments)    Was  hospitalized    Other Swelling and Other (See Comments)    Steroids Fluid seeping out of legs    Tramadol      Antimicrobials this admission: Cefepime , Linezolid , Flagyl  x1 5/13 Vancomycin  5/13 >>  Microbiology results: 4/8 BCx >> 1/4 E faecalis + MRSE 4/9 BCx >> NGTD  5/13 BCx: MRSE 5/13 MRSA PCR: neg 5/13 Resp PCR: COVID+  Thank you for allowing pharmacy to be a part of this patient's care.  Deakin Lacek 12/07/2023 9:37 AM

## 2023-12-07 NOTE — Progress Notes (Signed)
 PHARMACY - PHYSICIAN COMMUNICATION CRITICAL VALUE ALERT - BLOOD CULTURE IDENTIFICATION (BCID)  Phillip Young is an 38 y.o. male who presented to [facility] on 12/06/2023 with a chief complaint of hypotension and hypoxemia.   Assessment:  Patient with history of MRSE bacteremia on vancomycin . Now with 2/4 blood cultures positive for MRSE.   Name of physician (or Provider) Contacted: Dr. Marene Shape   Current antibiotics: n/a  Changes to prescribed antibiotics recommended:  Recommendations accepted by provider. Start vancomycin .   Results for orders placed or performed during the hospital encounter of 12/06/23  Blood Culture ID Panel (Reflexed) (Collected: 12/06/2023  2:33 AM)  Result Value Ref Range   Enterococcus faecalis NOT DETECTED NOT DETECTED   Enterococcus Faecium NOT DETECTED NOT DETECTED   Listeria monocytogenes NOT DETECTED NOT DETECTED   Staphylococcus species DETECTED (A) NOT DETECTED   Staphylococcus aureus (BCID) NOT DETECTED NOT DETECTED   Staphylococcus epidermidis DETECTED (A) NOT DETECTED   Staphylococcus lugdunensis NOT DETECTED NOT DETECTED   Streptococcus species NOT DETECTED NOT DETECTED   Streptococcus agalactiae NOT DETECTED NOT DETECTED   Streptococcus pneumoniae NOT DETECTED NOT DETECTED   Streptococcus pyogenes NOT DETECTED NOT DETECTED   A.calcoaceticus-baumannii NOT DETECTED NOT DETECTED   Bacteroides fragilis NOT DETECTED NOT DETECTED   Enterobacterales NOT DETECTED NOT DETECTED   Enterobacter cloacae complex NOT DETECTED NOT DETECTED   Escherichia coli NOT DETECTED NOT DETECTED   Klebsiella aerogenes NOT DETECTED NOT DETECTED   Klebsiella oxytoca NOT DETECTED NOT DETECTED   Klebsiella pneumoniae NOT DETECTED NOT DETECTED   Proteus species NOT DETECTED NOT DETECTED   Salmonella species NOT DETECTED NOT DETECTED   Serratia marcescens NOT DETECTED NOT DETECTED   Haemophilus influenzae NOT DETECTED NOT DETECTED   Neisseria meningitidis NOT DETECTED NOT  DETECTED   Pseudomonas aeruginosa NOT DETECTED NOT DETECTED   Stenotrophomonas maltophilia NOT DETECTED NOT DETECTED   Candida albicans NOT DETECTED NOT DETECTED   Candida auris NOT DETECTED NOT DETECTED   Candida glabrata NOT DETECTED NOT DETECTED   Candida krusei NOT DETECTED NOT DETECTED   Candida parapsilosis NOT DETECTED NOT DETECTED   Candida tropicalis NOT DETECTED NOT DETECTED   Cryptococcus neoformans/gattii NOT DETECTED NOT DETECTED   Methicillin resistance mecA/C DETECTED (A) NOT DETECTED    Patience Bonito 12/07/2023  7:33 AM

## 2023-12-07 NOTE — Progress Notes (Signed)
   12/07/23 2308  BiPAP/CPAP/SIPAP  BiPAP/CPAP/SIPAP Pt Type Adult  BiPAP/CPAP/SIPAP  (Home Bipap)  Mask Type Full face mask  Dentures removed? Not applicable  Mask Size Large  Patient Home Machine Yes  Safety Check Completed by RT for Home Unit Yes, no issues noted  CPAP/SIPAP surface wiped down Yes  Device Plugged into RED Power Outlet Yes   Pt was agreeable to wearing his home bipap tonight.  Resting comfortably.  RT wil lcontinue to monitor

## 2023-12-07 NOTE — Progress Notes (Signed)
 NAME:  Phillip Young, MRN:  [id number], DOB:  [birth year], LOS: 1 ADMISSION DATE:  12/06/2023, CONSULTATION DATE:  5/13/2 REFERRING MD:  EDP, CHIEF COMPLAINT:  hypotension and hypoxemia   History of Present Illness:   38 yo chronically ill male with PMH significant biventricular HF, chronic Afib on Eliquis , chronic hypotension on midodrine  30mg  TID, ESRD on MWF iHD, chronic hypoxic/ hypercarbic respiratory failure on 2-4L Ambia and NIV, severe morbid obesity, and chronic pain presented with SOB, continued increased LE swelling with pain, and weeping from left foot.  Denies recent fever or chills.  Last iHD 5/9.  Found to be hypotensive requiring levophed  support and NIV for WOB in ER.  CXR clear.  BNP 1176, lactate increasing 4.8> 7.9, WBC 11.9 but afebrile.  Concern for sepsis given LLE wound and other wounds> pt unable to lay flat and or examine due to pain.  Cefepime , linezolid , and flagyl  started.   Recent MRSE and E. Faecalis bacteremia 4/8 felt 2/2 HD cath, not a candidate for TEE to rule out endocarditis, on vanc w/ iHD scheduled till 5/25 however pt has been refusing for 2 weeks due to it causing leg pain.  Fifth hospitalization this year.     Pertinent  Medical History  Biventricular HF Chronic Afib on Eliquis   ESRD MWF Chronic hypotension on midodrine  Chronic hypoxic/ hypercarbic respiratory failure Severe morbid obesity LLE cellulitis with wound Sacral decub> resolved Chronic macrocytic anemia Chronic pain HITT- 2022, SRA + Suspected prior amiodarone  toxicity   Significant Hospital Events: Including procedures, antibiotic start and stop dates in addition to other pertinent events   Admitted to ICU 5/13 5/13 started on CRRT, weaning NE, SARS +  Interim History / Subjective:  Has not been wearing NIV- voices significant mistrust with staff and afraid if he wears it we will do something to him like intubate him.  Had discussion with ongoing wishes for DNI but ok with CPR.   Explained difficult situation- said to call his mother and she will make decision for intubation.   Drinking only juice- wants family to bring in outside food, not wanting hospital food.  Upset his pain medication was reduced overnight.   Remains on peripheral NE , UF -150 to 200 ml/hr, -1.4L/ 24hrs, wt up 181.4> 184.3  BC +MRSE COVID +  Objective    Blood pressure 98/81, pulse (!) 110, temperature 98 F (36.7 C), temperature source Oral, resp. rate 19, height 6' (1.829 m), weight (!) 184.3 kg, SpO2 (!) 81%.        Intake/Output Summary (Last 24 hours) at 12/07/2023 0846 Last data filed at 12/07/2023 0804 Gross per 24 hour  Intake 1531.99 ml  Output 3186 ml  Net -1654.01 ml   Filed Weights   12/06/23 0213 12/07/23 0415  Weight: (!) 181.4 kg (!) 184.3 kg   Examination: General:  AoC ill appearing morbidly obese male sitting upright in bed in NAD HEENT: MM pink/moist, hoarse  Neuro: awake, oriented, MAE CV: irir, rate ~100-110, distant PULM:  clear, diminished in bases, on [place] 6L GI: soft, bs+, NT, pt reports occasionally voids  Extremities: warm/dry, anasarca> no erythema  Skin: no rashes  afebrile Labs> Na 130, 4.7, bicarb 17, BUN/ sCr 39/ 7.5, phos 6.3, WBC 12.5, H/H 10.7/ 32  5/13 MRSA PCR > neg 5/13 resp panel > SARS + 5/13 Bcx2> MRSE  Resolved problem list   Assessment and Plan   Shock -2/2 AoC biventricular HF with severe volume overload, recurrent MRSE bacteremia  Chronic  hypotension on midodrine  Chronic Afib on Eliquis   - hx of med non-compliance due to financial constraints.   P:  - cont to wean peripheral NE for MAP goal > 60/ SBP > 90.  Remains on low dose peripheral NE> unable to wean further given UF goals.  Pt refusing CVL placement at time time> aware of risk of continued peripheral administration  - cont midodrine  15m gTID - repeat echo> EF < 20, RVSF severely reduced, RVSP 45.9, interventricular septum flatted c/w right ventricular overload,  trivial MR and moderate TR (improved from prior echo of mod MR and severe TR from April 2025) - trop hs flat neg> 57> 23  - volume removal per CRRT - remains rate controlled>  cont digoxin , no amio with prior concern for amio toxicity - cont eliquis  - ID consulted for recurrent bacteremia.  Of note, pt had been refusing outpt vanc w/iHD for 2 weeks.  Restart vanc - trend WBC / fever curve  - chronic hypotension limites GDMT.  Pt not interesting in seeing AHF     ESRD on MWF Hypervolemic hyponatremia AGMA/ lactic acidosis - frequently stops iHD early on EMR review due to cramping - EDW 155.5 but outpt HD recently increased to 180kg> current wt 181kg - TDC placed 11/10/23 P:  - CRRT per Nephrology    Acute on chronic hypoxic/ hypercarbic resp failure on HOT/ NIV due to volume overload and deconditioning - CXR without infiltrates or pulmonary edema on admit P:  - cont supplemental O2 for sat goal > 88%, near baseline O2 - continue to educate and recommend NIV compliance- did not wear overnight - aggressive pulm hygiene  - no URI symptoms   T2DM - A1c 6.2 - hold prn SSI, was hypoglycemic 5/13.  CBG q4, add back if consistently elevated due to risk of recurrent hypoglycemia - encouraged to eat more protein, just drinking juice    Alk phos elevated - chronically, likely due to ESRD   Anemia of chronic disease  Coagulopathy - on eliquis , no evidence of bleeding, H/H stable   Hypothyroidism - TSH 2.7 - cont synthroid     Severe morbid obesity- BMI 54, at risk for malnutrition  Functional deconditioning  - financial limitations have prevented prior GLP-1 - encourage renal / heart healthy diet w/ fluid restriction> no fast food.     Chronic wounds - no erythema or purulence, cont supportive care.  Old sacral scarring.    Chronic pain/ polyneuropathy  - cont lyrica , fentanyl  12 mcg patch, and oxy prn w/ bowel regimen - cont effexor     GOC - DNI but ok with CPR and  other meds/ interventions. Discussed again this am, if decompensates> pt defers intubation decision to mother.  Pt seems to have poor insight into disease issue/ severity.  Challenging at times to discuss health issues and manage care as pt manipulates certain meds and care essential to his recovery.  His mistrust with prior healthcare experiences is contributing but continued support and reassurance is ongoing to re-establish trust.    Best Practice (right click and "Reselect all SmartList Selections" daily)   Diet/type: renal/ heart healthy DVT prophylaxis SCDeliquis Pressure ulcer(s): present on admission  GI prophylaxis: PPI Lines: N/A Foley:  N/A Code Status:  full code> does not want intubation > defer to mom in event of arrest Last date of multidisciplinary goals of care discussion [12/07/23]  Labs   CBC: Recent Labs  Lab 12/06/23 0216 12/06/23 0323 12/07/23 0551  WBC 11.9*  --  12.5*  NEUTROABS 10.3*  --   --   HGB 10.7* 10.2* 10.7*  HCT 33.8* 30.0* 32.4*  MCV 100.3*  --  96.7  PLT 262  --  271    Basic Metabolic Panel: Recent Labs  Lab 12/06/23 0216 12/06/23 0323 12/06/23 1541 12/07/23 0551 12/07/23 0552  NA 132* 129* 129* 129* 130*  K 4.7 4.4 5.2* 4.8 4.7  CL 90*  --  92* 94* 94*  CO2 18*  --  14* 16* 17*  GLUCOSE 83  --  102* 126* 124*  BUN 49*  --  49* 39* 39*  CREATININE 10.83*  --  9.57* 7.49* 7.50*  CALCIUM  9.3  --  8.8* 8.9 9.1  MG  --   --   --  2.3  --   PHOS  --   --  7.5*  --  6.3*   GFR: Estimated Creatinine Clearance: 22.7 mL/min (A) (by C-G formula based on SCr of 7.5 mg/dL (H)). Recent Labs  Lab 12/06/23 0216 12/06/23 0232 12/06/23 0328 12/06/23 0430 12/07/23 0551  WBC 11.9*  --   --   --  12.5*  LATICACIDVEN  --  4.8* 7.9* >9.0*  --     Liver Function Tests: Recent Labs  Lab 12/06/23 0216 12/06/23 1541 12/07/23 0551 12/07/23 0552  AST 25  --  27  --   ALT 15  --  16  --   ALKPHOS 239*  --  187*  --   BILITOT 2.3*  --  3.4*   --   PROT 7.8  --  7.2  --   ALBUMIN  3.3* 3.0* 3.0* 3.0*   No results for input(s): "LIPASE", "AMYLASE" in the last 168 hours. No results for input(s): "AMMONIA" in the last 168 hours.  ABG    Component Value Date/Time   PHART 7.31 (L) 03/08/2023 2030   PCO2ART 37 03/08/2023 2030   PO2ART 127 (H) 03/08/2023 2030   HCO3 15.9 (L) 12/06/2023 0323   TCO2 17 (L) 12/06/2023 0323   ACIDBASEDEF 10.0 (H) 12/06/2023 0323   O2SAT 98 12/06/2023 0323     Coagulation Profile: Recent Labs  Lab 12/06/23 0430  INR 3.5*    Cardiac Enzymes: Recent Labs  Lab 12/07/23 0551  CKTOTAL 30*    HbA1C: Hemoglobin A1C  Date/Time Value Ref Range Status  06/21/2023 03:09 PM 4.6 4.0 - 5.6 % Final  11/05/2022 11:44 AM 5.6 4.0 - 5.6 % Final   Hgb A1c MFr Bld  Date/Time Value Ref Range Status  12/06/2023 02:16 AM 6.2 (H) 4.8 - 5.6 % Final    Comment:    (NOTE)         Prediabetes: 5.7 - 6.4         Diabetes: >6.4         Glycemic control for adults with diabetes: <7.0   03/26/2022 07:58 PM 5.3 4.8 - 5.6 % Final    Comment:    (NOTE) Pre diabetes:          5.7%-6.4%  Diabetes:              >6.4%  Glycemic control for   <7.0% adults with diabetes     CBG: Recent Labs  Lab 12/06/23 1538 12/06/23 2036 12/06/23 2333 12/07/23 0341 12/07/23 0736  GLUCAP 111* 100* 107* 100* 119*     Allergies Allergies  Allergen Reactions   Amiodarone  Other (See Comments)    Suspicion for amiodarone  lung/hepatotoxicity    Coreg  [Carvedilol ] Shortness  Of Breath and Diarrhea    Wheezing    Heparin  Other (See Comments)    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, SRA positive   Metoprolol  Other (See Comments)    near syncope   Amoxicillin  Other (See Comments)    Was hospitalized    Other Swelling and Other (See Comments)    Steroids Fluid seeping out of legs    Tramadol       Home Medications  Prior to Admission medications   Medication Sig Start Date End Date Taking? Authorizing Provider   acetaminophen  (TYLENOL ) 500 MG tablet Take 2 tablets (1,000 mg total) by mouth every 6 (six) hours. Patient taking differently: Take 1,000 mg by mouth every 4 (four) hours as needed for mild pain (pain score 1-3) or moderate pain (pain score 4-6). 09/06/23   Malen Scudder, DO  apixaban  (ELIQUIS ) 5 MG TABS tablet Take 1 tablet (5 mg total) by mouth 2 (two) times daily. 06/03/23   Carleen Chary, DO  vitamin D3 (CHOLECALCIFEROL ) 25 MCG tablet Take 0.5 tablets (500 Units total) by mouth daily. 11/12/23   [name], MD  cinacalcet  (SENSIPAR ) 30 MG tablet Take 1 tablet (30 mg total) by mouth every dialysis. Patient taking differently: Take 30 mg by mouth every Monday, Wednesday, and Friday. 09/12/23   Sheree Dieter, MD  clindamycin  (CLINDAGEL ) 1 % gel Apply topically 2 (two) times daily. Patient taking differently: Apply 1 Application topically 2 (two) times daily. 10/06/23   Carleen Chary, DO  Darbepoetin Alfa  (ARANESP ) 150 MCG/0.3ML SOSY injection To be given during dialysis by [facility] per Nephrology. Not to be given to patient at SNF. 09/06/23   Sheree Dieter, MD  digoxin  (LANOXIN ) 0.125 MG tablet Take 1 tablet (0.125 mg total) by mouth 3 (three) times a week. Monday, Wednesday, Friday (dialysis days) 11/14/23   [name], MD  fentaNYL  (DURAGESIC ) 12 MCG/HR Place 1 patch onto the skin every 3 (three) days. 11/14/23   [name], MD  levothyroxine  (SYNTHROID ) 25 MCG tablet Take 1 tablet (25 mcg total) by mouth daily at 6 (six) AM. 11/12/23   [name], MD  midodrine  (PROAMATINE ) 10 MG tablet Take 3 tablets (30 mg total) by mouth 3 (three) times daily with meals. Patient not taking: Reported on 11/03/2023 06/20/23   Setzer, Sandra J, PA-C  naloxone [facility]) 2 MG/2ML injection Inject 2 mg into the skin as needed. Patient not taking: Reported on 11/03/2023    [provider]  Oxycodone  HCl 10 MG TABS Take 1 tablet (10 mg total) by mouth every 6 (six) hours as  needed. 12/02/23   Carleen Chary, DO  pregabalin  (LYRICA ) 50 MG capsule Take 1 capsule (50 mg total) by mouth daily. Take one capsule daily. 11/12/23   [name], MD  sevelamer  carbonate (RENVELA ) 800 MG tablet Take 2 tablets (1,600 mg total) by mouth 3 (three) times daily with meals. 11/12/23   [name], MD  tirzepatide [facility]) 2.5 MG/0.5ML injection vial Inject 2.5 mg into the skin once a week. Patient not taking: Reported on 10/11/2023 08/25/23   Sheree Dieter, MD  vancomycin  IVPB Inject 1,000 mg into the vein every Monday, Wednesday, and Friday with hemodialysis. Indication:  MRSE + E faecalis bacteremia Last Day of Therapy:  12/18/23 Labs - weekly w/ HD:  CBC/D, BMP, and vancomycin  trough; ESR and CRP Method of administration:Elastomeric or per HD protocol - to be given at the HD center 11/09/23 12/18/23  Ernie Heal, Jerelyn Money, MD  venlafaxine  XR (EFFEXOR  XR) 37.5  MG 24 hr capsule Take 1 capsule (37.5 mg total) by mouth daily. 11/12/23 11/11/24  [name], MD     Critical care time: 45 min       Early Glisson, MSN, AG-ACNP-BC [facility] 12/07/2023, 8:46 AM  See Amion for pager If no response to pager , please call [phone] until 7pm After 7:00 pm call Elink  [phone]

## 2023-12-07 NOTE — Consult Note (Signed)
 [facility]    Date of Admission:  12/06/2023     Total days of antibiotics 1   Vancomycin                Reason for Consult: MRSE, recurrent bacteremias    Referring Provider: Charon Copper  Primary Care Provider: Carleen Chary, DO   Assessment: Roderic Sayer is a 38 y.o. male admitted from home with hypoxia and hypotension -   Recurrent Bloodstream Infections -  MRSE -  Not identified as to where his blood cultures were drawn, growing gram positive cocci from both sites after he self discontinued Vancomycin  due to causing feet burning. He is tolerating this well while he is here.  We discussed that his line will again need to be removed. We were not able to evaluate him properly for endocarditis and he was receiving presumptive treatment; this recurrent bacteremia raises clinical suspicion. Once off CRRT will need to work with nephrology re; timing of line removal   HD Line -  Fistula?  Ongoing risk with HD catheters which have contributed to other more morbid sequela such as vertebral infection in the past.  He is asking for a fistula to be placed, which I agree would lower his risk. I am not as familiar with his history of previous vascular accesses but it is a reasonable question to discuss.  Will need to wait until his infection clears regardless.   ESRD -  On CRRT for now Nephrology following Unable to remove catheter at present.   Heart Failure, HFrEF -  LVEF <20% Severe RV enlargement  with biventricular failure.  No findings on TTE that would suggest endocarditis.  Not a candidate for TEE   COVID 19 Infection -  Not hypoxic. Does not require treatment   Isolation Recommendations -  Airborne Contact to remain   Plan: Continue Vancomycin   Will need line holiday once CRRT off - will need to time with nephrology  Will restart abx clock when current line gets pulled.    Principal Problem:   Shock [facility]) Active Problems:    Bacteremia associated with intravascular line ([name])    (feeding supplement) PROSource Plus  30 mL Oral TID BM   apixaban   5 mg Oral BID   Chlorhexidine  Gluconate Cloth  6 each Topical Daily   digoxin   0.125 mg Oral Once per day on Monday Wednesday Friday   feeding supplement  237 mL Oral BID BM   fentaNYL   1 patch Transdermal Q72H   levothyroxine   25 mcg Oral Q0600   midodrine   30 mg Oral TID WC   multivitamin  1 tablet Oral QHS   pantoprazole   40 mg Oral Daily   polyethylene glycol  17 g Oral Daily   pregabalin   50 mg Oral Daily   venlafaxine  XR  37.5 mg Oral Daily    HPI: Daigan Mudd is a 38 y.o. male admitted from home with hypotension and hypoxemia.   Extensive PMHx noted.   Reviewed recent hospitalization and pertinent ID history -  Bacteremia 4/08 - enterococcus faecalis, MRSE --> catheter was removed and replaced. TEE unable to be done and treated empirically with vancomycin  with HD with end date through 12/18/2023  Bacteremia 04/2023 - MSSA  Bacteremia 03/2021 - MSSA with paraspinal abscess, L SI joint septic arthritis.   Said he stopped the vancomycin  due to feet burning because they were not mixing it right. Started a cough in January without  any notable features of acute changes. Came in volume overloaded. No fevers from what he declares.   Review of Systems: Review of Systems  Constitutional:  Negative for chills, fever, malaise/fatigue and weight loss.  HENT:  Negative for sore throat.        No dental problems  Respiratory:  Negative for cough and sputum production.   Cardiovascular:  Negative for chest pain and leg swelling.  Gastrointestinal:  Negative for abdominal pain, diarrhea and vomiting.  Genitourinary:  Negative for dysuria and flank pain.  Musculoskeletal:  Negative for joint pain, myalgias and neck pain.  Skin:  Negative for rash.  Neurological:  Negative for dizziness, tingling and headaches.  Psychiatric/Behavioral:  Negative for depression and  substance abuse. The patient is not nervous/anxious and does not have insomnia.     Past Medical History:  Diagnosis Date   Acute on chronic respiratory failure with hypoxia ([name]) 04/21/2021   Acute on chronic systolic (congestive) heart failure ([name]) 02/26/2020   Amiodarone  toxicity    Anemia    Atrial flutter ([name])    Biventricular congestive heart failure ([name])    Chronic hypoxemic respiratory failure ([name])    Class 3 severe obesity due to excess calories with serious comorbidity and body mass index (BMI) of 50.0 to 59.9 in adult 02/26/2020   ESRD on hemodialysis ([name])    Essential hypertension 02/26/2020   GERD without esophagitis 02/26/2020   Hidradenitis suppurativa 02/26/2020   NICM (nonischemic cardiomyopathy) ([name])    Obesity hypoventilation syndrome ([name])    OSA (obstructive sleep apnea)    PAF (paroxysmal atrial fibrillation) ([name])    Pneumonia    Prediabetes 02/26/2020    Social History   Tobacco Use   Smoking status: Former    Current packs/day: 0.00    Types: Cigarettes    Quit date: 2019    Years since quitting: 6.3    Passive exposure: Current   Smokeless tobacco: Former   Tobacco comments:    quit in 2019  Vaping Use   Vaping status: Never Used  Substance Use Topics   Alcohol  use: Never   Drug use: Never    Family History  Problem Relation Age of Onset   Heart disease Mother    Hypertension Mother    Pulmonary Hypertension Mother    Drug abuse Father        died due to Heroin overdose   Allergies  Allergen Reactions   Amiodarone  Other (See Comments)    Suspicion for amiodarone  lung/hepatotoxicity    Coreg  [Carvedilol ] Shortness Of Breath and Diarrhea    Wheezing    Heparin  Other (See Comments)    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 rate 16, height 6' (1.829 m), weight (!) 184.3 kg, SpO2 98%.  Physical Exam Vitals and nursing note reviewed.  Constitutional:      General: He is not in acute distress.    Appearance: He is ill-appearing.  HENT:     Mouth/Throat:     Mouth: Mucous membranes are moist.     Pharynx: Oropharynx is clear.  Cardiovascular:     Rate and Rhythm: Normal  rate and regular rhythm.  Pulmonary:     Effort: Pulmonary effort is normal.     Breath sounds: Normal breath sounds.  Chest:     Comments: Rt chest HD port in place with CRRT ongoing  Abdominal:     General: Bowel sounds are normal. There is no distension.     Palpations: Abdomen is soft.  Musculoskeletal:        General: Normal range of motion.     Cervical back: Normal range of motion.  Skin:    General: Skin is warm and dry.     Capillary Refill: Capillary refill takes less than 2 seconds.  Neurological:     Mental Status: He is oriented to person, place, and time.     Lab Results Lab Results  Component Value Date   WBC 12.5 (H) 12/07/2023   HGB 10.7 (L) 12/07/2023   HCT 32.4 (L) 12/07/2023   MCV 96.7 12/07/2023   PLT 271 12/07/2023    Lab Results  Component Value Date   CREATININE 7.50 (H) 12/07/2023   BUN 39 (H) 12/07/2023   NA 130 (L) 12/07/2023   K 4.7 12/07/2023   CL 94 (L) 12/07/2023   CO2 17 (L) 12/07/2023    Lab Results  Component Value Date   ALT 16 12/07/2023   AST 27 12/07/2023   GGT 30 12/06/2023   ALKPHOS 187 (H) 12/07/2023   BILITOT 3.4 (H) 12/07/2023     Microbiology: Recent Results (from the past 240 hours)  Blood culture (routine x 2)     Status: None (Preliminary result)   Collection Time: 12/06/23  2:30 AM   Specimen: BLOOD  Result Value Ref Range Status   Specimen Description BLOOD SITE NOT SPECIFIED  Final   Special Requests   Final    BOTTLES DRAWN AEROBIC AND  ANAEROBIC Blood Culture results may not be optimal due to an inadequate volume of blood received in culture bottles   Culture  Setup Time   Final    GRAM POSITIVE COCCI ANAEROBIC BOTTLE ONLY CRITICAL VALUE NOTED.  VALUE IS CONSISTENT WITH PREVIOUSLY REPORTED AND CALLED VALUE. Performed at [facility], [address] [place], [place] [address]    Culture GRAM POSITIVE COCCI  Final   Report Status PENDING  Incomplete  Blood culture (routine x 2)     Status: None (Preliminary result)   Collection Time: 12/06/23  2:33 AM   Specimen: BLOOD  Result Value Ref Range Status   Specimen Description BLOOD SITE NOT SPECIFIED  Final   Special Requests   Final    BOTTLES DRAWN AEROBIC AND ANAEROBIC Blood Culture results may not be optimal due to an inadequate volume of blood received in culture bottles   Culture  Setup Time   Final    GRAM POSITIVE COCCI AEROBIC BOTTLE ONLY CRITICAL RESULT CALLED TO, READ BACK BY AND VERIFIED WITH: J [facility]  12/07/23 MK Performed at [facility], [address], [place], [place] [address]    Culture GRAM POSITIVE COCCI  Final   Report Status PENDING  Incomplete  Blood Culture ID Panel (Reflexed)     Status: Abnormal   Collection Time: 12/06/23  2:33 AM  Result Value Ref Range Status   Enterococcus faecalis NOT DETECTED NOT DETECTED Final   Enterococcus Faecium NOT DETECTED NOT DETECTED Final   Listeria monocytogenes NOT DETECTED NOT DETECTED Final   Staphylococcus species DETECTED (A) NOT DETECTED Final    Comment: CRITICAL RESULT CALLED  TO, READ BACK BY AND VERIFIED WITH: J [facility]  12/07/23 MK    Staphylococcus aureus (BCID) NOT DETECTED NOT DETECTED Final   Staphylococcus epidermidis DETECTED (A) NOT DETECTED Final    Comment: Methicillin (oxacillin) resistant coagulase negative staphylococcus. Possible blood culture contaminant (unless isolated from more than one blood culture draw or clinical case suggests pathogenicity).  No antibiotic treatment is indicated for blood  culture contaminants. CRITICAL RESULT CALLED TO, READ BACK BY AND VERIFIED WITH: J WYLAND,PHARMD@[id number]  12/07/23 MK    Staphylococcus lugdunensis NOT DETECTED NOT DETECTED Final   Streptococcus species NOT DETECTED NOT DETECTED Final   Streptococcus agalactiae NOT DETECTED NOT DETECTED Final   Streptococcus pneumoniae NOT DETECTED NOT DETECTED Final   Streptococcus pyogenes NOT DETECTED NOT DETECTED Final   A.calcoaceticus-baumannii NOT DETECTED NOT DETECTED Final   Bacteroides fragilis NOT DETECTED NOT DETECTED Final   Enterobacterales NOT DETECTED NOT DETECTED Final   Enterobacter cloacae complex NOT DETECTED NOT DETECTED Final   Escherichia coli NOT DETECTED NOT DETECTED Final   Klebsiella aerogenes NOT DETECTED NOT DETECTED Final   Klebsiella oxytoca NOT DETECTED NOT DETECTED Final   Klebsiella pneumoniae NOT DETECTED NOT DETECTED Final   Proteus species NOT DETECTED NOT DETECTED Final   Salmonella species NOT DETECTED NOT DETECTED Final   Serratia marcescens NOT DETECTED NOT DETECTED Final   Haemophilus influenzae NOT DETECTED NOT DETECTED Final   Neisseria meningitidis NOT DETECTED NOT DETECTED Final   Pseudomonas aeruginosa NOT DETECTED NOT DETECTED Final   Stenotrophomonas maltophilia NOT DETECTED NOT DETECTED Final   Candida albicans NOT DETECTED NOT DETECTED Final   Candida auris NOT DETECTED NOT DETECTED Final   Candida glabrata NOT DETECTED NOT DETECTED Final   Candida krusei NOT DETECTED NOT DETECTED Final   Candida parapsilosis NOT DETECTED NOT DETECTED Final   Candida tropicalis NOT DETECTED NOT DETECTED Final   Cryptococcus neoformans/gattii NOT DETECTED NOT DETECTED Final   Methicillin resistance mecA/C DETECTED (A) NOT DETECTED Final    Comment: CRITICAL RESULT CALLED TO, READ BACK BY AND VERIFIED WITH: J WYLAND,[email or web address]  12/07/23 MK Performed at [facility], [address], [place], [place] [address]   MRSA  Next Gen by PCR, Nasal     Status: None   Collection Time: 12/06/23  5:03 AM   Specimen: Nasal Mucosa; Nasal Swab  Result Value Ref Range Status   MRSA by PCR Next Gen NOT DETECTED NOT DETECTED Final    Comment: (NOTE) The GeneXpert MRSA Assay (FDA approved for NASAL specimens only), is one component of a comprehensive MRSA colonization surveillance program. It is not intended to diagnose MRSA infection nor to guide or monitor treatment for MRSA infections. Test performance is not FDA approved in patients less than 29 years old. Performed at [facility], [address], [place], [place] [address]   Resp panel by RT-PCR (RSV, Flu A&B, Covid) Anterior Nasal Swab     Status: Abnormal   Collection Time: 12/06/23  9:25 AM   Specimen: Anterior Nasal Swab  Result Value Ref Range Status   SARS Coronavirus 2 by RT PCR POSITIVE (A) NEGATIVE Final   Influenza A by PCR NEGATIVE NEGATIVE Final   Influenza B by PCR NEGATIVE NEGATIVE Final    Comment: (NOTE) The Xpert Xpress SARS-CoV-2/FLU/RSV plus assay is intended as an aid in the diagnosis of influenza from Nasopharyngeal swab specimens and should not be used as a sole basis for treatment. Nasal washings and aspirates are unacceptable for Xpert Xpress SARS-CoV-2/FLU/RSV testing.  Fact Sheet for Patients: BloggerCourse.com  Fact Sheet for Healthcare Providers: SeriousBroker.it  This test is not yet approved or cleared by the [place]  FDA and has been authorized for detection and/or diagnosis of SARS-CoV-2 by FDA under an Emergency Use Authorization (EUA). This EUA will remain in effect (meaning this test can be used) for the duration of the COVID-19 declaration under Section 564(b)(1) of the Act, 21 U.S.C. section 360bbb-3(b)(1), unless the authorization is terminated or revoked.     Resp Syncytial Virus by PCR NEGATIVE NEGATIVE Final    Comment: (NOTE) Fact Sheet for  Patients: BloggerCourse.com  Fact Sheet for Healthcare Providers: SeriousBroker.it  This test is not yet approved or cleared by the [place]  FDA and has been authorized for detection and/or diagnosis of SARS-CoV-2 by FDA under an Emergency Use Authorization (EUA). This EUA will remain in effect (meaning this test can be used) for the duration of the COVID-19 declaration under Section 564(b)(1) of the Act, 21 U.S.C. section 360bbb-3(b)(1), unless the authorization is terminated or revoked.  Performed at [facility], [address] [place], [place] [address]     Gibson Kurtz, MSN, NP-C [facility] for Infectious Disease [facility] Pager: [phone]  12/07/2023 2:52 PM

## 2023-12-07 NOTE — Progress Notes (Signed)
 [facility] Progress Note  Subjective:  UF has been about 150- 200 cc/hr net negative   Vitals:   12/07/23 1400 12/07/23 1415 12/07/23 1445 12/07/23 1500  BP: 94/74 (!) 89/66 109/71   Pulse: 100 (!) 123 94 100  Resp: 19 (!) 25 16 19   Temp:      TempSrc:      SpO2: 98% 94% 98% 100%  Weight:      Height:        Exam: Gen alert, acutely and chronically ill nasal O2 not in respiratory distress No jvd  Chest clear bilat to bases RRR no RG Abd soft morbidly obese ntnd no mass or ascites +bs Ext diffuse 2+ bilateral pitting edema, tender to the touch Neuro is alert, Ox 3 , nf, deconditioned     TDC intact       Renal-related home meds: Sensipar  30 mg MWF Midodrine  30 mg 3 times daily Renvela  2 AC 3 times daily      OP HD: MWF [facility] 4.5h  B400   180kg   TDC   NO heparin  (HIT)       Assessment/ Plan: Acute on chronic hypoxic and hypercarbic resp failure. CXR w/ some IS pulm edema. Started CRRT 5/13 w/ UF goal 150- 200 cc/hr as tolerated.  Shock: possibly cardiogenic, hx bivent CHF. Levo at 4 mcg /min Chronic vol overload ESRD: on HD MWF. CRRT started on 5/13. Cont CRRT.  Chronic hypotension: takes 30mg  tid of midodrine  at home, cont here. Also pressor support.  Anemia of esrd: Hb 10, follow Secondary hyperparathyroidism: CCa in range, follow, cont binders w/ meals.     Larry Poag MD  [facility] 12/07/2023, 3:18 PM  Recent Labs  Lab 12/06/23 0323 12/06/23 1541 12/07/23 0551 12/07/23 0552  HGB 10.2*  --  10.7*  --   ALBUMIN   --  3.0* 3.0* 3.0*  CALCIUM   --  8.8* 8.9 9.1  PHOS  --  7.5*  --  6.3*  CREATININE  --  9.57* 7.49* 7.50*  K 4.4 5.2* 4.8 4.7   No results for input(s): "IRON", "TIBC", "FERRITIN" in the last 168 hours. Inpatient medications:  (feeding supplement) PROSource Plus  30 mL Oral TID BM   apixaban   5 mg Oral BID   Chlorhexidine  Gluconate Cloth  6 each Topical Daily   digoxin   0.125 mg Oral Once per day on Monday Wednesday Friday    feeding supplement  237 mL Oral BID BM   fentaNYL   1 patch Transdermal Q72H   levothyroxine   25 mcg Oral Q0600   midodrine   30 mg Oral TID WC   multivitamin  1 tablet Oral QHS   pantoprazole   40 mg Oral Daily   polyethylene glycol  17 g Oral Daily   pregabalin   50 mg Oral Daily   venlafaxine  XR  37.5 mg Oral Daily    norepinephrine  (LEVOPHED ) Adult infusion 4 mcg/min (12/07/23 1500)   prismasol  BGK 4/2.5 1,500 mL/hr at 12/07/23 1228   prismasol  BGK 4/2.5 400 mL/hr at 12/07/23 1412   prismasol  BGK 4/2.5 400 mL/hr at 12/07/23 1411   vancomycin  131.3 mL/hr at 12/07/23 1500   [START ON 12/08/2023] vancomycin      docusate sodium , ondansetron  (ZOFRAN ) IV, mouth rinse, oxyCODONE 

## 2023-12-07 NOTE — Progress Notes (Signed)
 Initial Nutrition Assessment  DOCUMENTATION CODES:  Morbid obesity  INTERVENTION:  Liberalize diet to 2 gram sodium diet; 1.2L fluid restriction Double protein portions with all meals Ensure Enlive po BID, each supplement provides 350 kcal and 20 grams of protein. 30 ml ProSource Plus TID, each supplement provides 100 kcals and 15 grams protein.  Renal MVI with minerals daily Reached out to Nephrology re: resuming phos binder; MD amenable  NUTRITION DIAGNOSIS:  Increased nutrient needs related to acute illness as evidenced by other (comment) (CRRT).  GOAL:  Patient will meet greater than or equal to 90% of their needs  MONITOR:  PO intake, Supplement acceptance, Labs, Weight trends, Skin, I & O's  REASON FOR ASSESSMENT:  Consult Assessment of nutrition requirement/status (CRRT protocol)  ASSESSMENT:  Pt admitted with hypotension and hypoxemia r/t cardiogenic shock due to acute on chronic biventricular HF and severe volume overload. Also found to be COVID+ on admission. PMH significant for ESRD on HD, biventricular HF, chronic afib, chronic hypotension, chronic respiratory failure, severe morbid obesity, chronic pain   5/13: admitted, CRRT initiated 5/14: diet advanced, renal 1.2L FR  Spoke with pt at bedside though nutrition history limited as pt focused on showing RD pictures of wounds from prior admission. Pt mentions that these have since healed but remain itchy.    Pt has been followed during multiple prior admissions by RD team.  He reports that he had not had breakfast up until that time and mentions that given acute illness he has not eaten in about 5 days. He states that his mom had been preparing food and he has been feeding it to the dog.   Spoke with RN who reports pt mentioned he will have family provide food when visiting from outside hospital. Also reports difficulty adhering to fluid restriction.   Obtained lunch order. Pt requesting double portions as previously  ordered during prior admissions. In addition he is requesting 10 ranch dressings with his salad. Unable to fulfill this request d/t exceeding nutrient limits, however double protein portions ordered. Received message from RN stating pt refusal to eat as he did not receive what was requested.   Pt states that he has previously tried Nepro but prefers vanilla Ensure. Amenable to ordering and receiving these supplements to promote adequate nutritional intake in setting of CRRT.   Pt states that since his admission in June 2024, he has primarily been bed bound.   Current weight: 1874.3 kg EDW: 155.5 kg + edmea: moderate pitting generalized, BUE; deep pitting BLE edema  Drains/lines: CRRT - net UF 3L x24 hours RIJ double lumen HD cath  Medications: SSI 0-9 units q4h, protonix  Drips: Levo @ 4mcg/min  Labs: Sodium 130 BUN 39 Cr 7.50 Anion gap 19 Ionized calcium  0.95 (5/13) Phos 6.3 Alkaline phos 187  GFR 9  NUTRITION - FOCUSED PHYSICAL EXAM: Volume overload limiting ability to adequately assess for muscle and subcutaneous fat deficits. Will continue to monitor.   Diet Order:   Diet Order             Diet 2 gram sodium Room service appropriate? Yes; Fluid consistency: Thin; Fluid restriction: 1200 mL Fluid  Diet effective now                   EDUCATION NEEDS:  Education needs have been addressed  Skin:  Skin Assessment: Skin Integrity Issues: Skin Integrity Issues:: Other (Comment) Other: 1.5 x 1cm L foot tear; 0.5 x 0.5cm L thigh  Last BM:  unknown/PTA  Height:  Ht Readings from Last 1 Encounters:  12/06/23 6' (1.829 m)    Weight:  Wt Readings from Last 1 Encounters:  12/07/23 (!) 184.3 kg    Ideal Body Weight:  80.9 kg  BMI:  Body mass index is 55.11 kg/m.  Estimated Nutritional Needs:   Kcal:  2300-2500  Protein:  160g  Fluid:  1L + UOP  Rocklin Chute, RDN, LDN Clinical Nutrition See AMiON for contact information.

## 2023-12-07 NOTE — Progress Notes (Signed)
 Heart Failure Navigator Progress Note  Assessed for Heart & Vascular TOC clinic readiness.  Patient does not meet criteria due to ESRD on hemodialysis.   Navigator will sign off at this time.   Rhae Hammock, BSN, Scientist, clinical (histocompatibility and immunogenetics) Only

## 2023-12-08 ENCOUNTER — Encounter: Admitting: Psychology

## 2023-12-08 DIAGNOSIS — I959 Hypotension, unspecified: Secondary | ICD-10-CM

## 2023-12-08 DIAGNOSIS — R570 Cardiogenic shock: Secondary | ICD-10-CM

## 2023-12-08 DIAGNOSIS — I4891 Unspecified atrial fibrillation: Secondary | ICD-10-CM | POA: Diagnosis not present

## 2023-12-08 DIAGNOSIS — B9562 Methicillin resistant Staphylococcus aureus infection as the cause of diseases classified elsewhere: Secondary | ICD-10-CM

## 2023-12-08 DIAGNOSIS — N186 End stage renal disease: Secondary | ICD-10-CM | POA: Diagnosis not present

## 2023-12-08 LAB — RENAL FUNCTION PANEL
Albumin: 2.9 g/dL — ABNORMAL LOW (ref 3.5–5.0)
Albumin: 2.7 g/dL — ABNORMAL LOW (ref 3.5–5.0)
Anion gap: 15 (ref 5–15)
Anion gap: 14 (ref 5–15)
BUN: 28 mg/dL — ABNORMAL HIGH (ref 6–20)
BUN: 26 mg/dL — ABNORMAL HIGH (ref 6–20)
CO2: 23 mmol/L (ref 22–32)
CO2: 23 mmol/L (ref 22–32)
Calcium: 8.9 mg/dL (ref 8.9–10.3)
Calcium: 8.7 mg/dL — ABNORMAL LOW (ref 8.9–10.3)
Chloride: 94 mmol/L — ABNORMAL LOW (ref 98–111)
Chloride: 95 mmol/L — ABNORMAL LOW (ref 98–111)
Creatinine, Ser: 5.59 mg/dL — ABNORMAL HIGH (ref 0.61–1.24)
Creatinine, Ser: 5.1 mg/dL — ABNORMAL HIGH (ref 0.61–1.24)
GFR, Estimated: 13 mL/min — ABNORMAL LOW (ref >60)
GFR, Estimated: 14 mL/min — ABNORMAL LOW (ref >60)
Glucose, Bld: 91 mg/dL (ref 70–99)
Glucose, Bld: 60 mg/dL — ABNORMAL LOW (ref 70–99)
Phosphorus: 5.1 mg/dL — ABNORMAL HIGH (ref 2.5–4.6)
Phosphorus: 4.5 mg/dL (ref 2.5–4.6)
Potassium: 4.4 mmol/L (ref 3.5–5.1)
Potassium: 4.5 mmol/L (ref 3.5–5.1)
Sodium: 132 mmol/L — ABNORMAL LOW (ref 135–145)
Sodium: 132 mmol/L — ABNORMAL LOW (ref 135–145)

## 2023-12-08 LAB — HEMOGLOBIN A1C
Hgb A1c MFr Bld: 5.9 % — ABNORMAL HIGH (ref 4.8–5.6)
Mean Plasma Glucose: 123 mg/dL

## 2023-12-08 LAB — GLUCOSE, CAPILLARY
Glucose-Capillary: 115 mg/dL — ABNORMAL HIGH (ref 70–99)
Glucose-Capillary: 147 mg/dL — ABNORMAL HIGH (ref 70–99)
Glucose-Capillary: 93 mg/dL (ref 70–99)
Glucose-Capillary: 100 mg/dL — ABNORMAL HIGH (ref 70–99)
Glucose-Capillary: 133 mg/dL — ABNORMAL HIGH (ref 70–99)

## 2023-12-08 LAB — HEPATITIS B SURFACE ANTIGEN: Hepatitis B Surface Ag: NONREACTIVE

## 2023-12-08 LAB — MAGNESIUM: Magnesium: 2.4 mg/dL (ref 1.7–2.4)

## 2023-12-08 MED ORDER — VANCOMYCIN HCL IN DEXTROSE 1-5 GM/200ML-% IV SOLN: 1000.0000 mg | INTRAVENOUS | Status: DC

## 2023-12-08 MED ORDER — CHLORHEXIDINE GLUCONATE CLOTH 2 % EX PADS
6.0000 | MEDICATED_PAD | Freq: Every day | CUTANEOUS | Status: DC
  Administered 2023-12-09 – 2023-12-10 (×2): 6 via TOPICAL

## 2023-12-08 MED ORDER — ANTICOAGULANT SODIUM CITRATE 4% (200MG/5ML) IV SOLN
5.0000 mL | Freq: Once | Status: AC
  Administered 2023-12-08: 5 mL
  Filled 2023-12-08: qty 5

## 2023-12-08 MED ORDER — HEPARIN SODIUM (PORCINE) 1000 UNIT/ML DIALYSIS: 1000.0000 [IU] | INTRAMUSCULAR | Status: DC | PRN

## 2023-12-08 MED ORDER — CYCLOBENZAPRINE HCL 5 MG PO TABS
5.0000 mg | ORAL_TABLET | Freq: Two times a day (BID) | ORAL | Status: DC | PRN
Start: 2023-12-08 — End: 2023-12-16
  Filled 2023-12-08: qty 1

## 2023-12-08 MED ORDER — VANCOMYCIN HCL IN DEXTROSE 1-5 GM/200ML-% IV SOLN
1000.0000 mg | INTRAVENOUS | Status: DC
  Administered 2023-12-09 – 2023-12-12 (×2): 1000 mg via INTRAVENOUS
  Filled 2023-12-08 (×2): qty 200

## 2023-12-08 MED ORDER — CYCLOBENZAPRINE HCL 5 MG PO TABS: 5.0000 mg | ORAL_TABLET | Freq: Three times a day (TID) | ORAL | Status: DC | PRN

## 2023-12-08 NOTE — Progress Notes (Addendum)
 PHARMACY NOTE:  ANTIMICROBIAL RENAL DOSAGE ADJUSTMENT  Current antimicrobial regimen includes a mismatch between antimicrobial dosage and estimated renal function.  As per policy approved by the Pharmacy & Therapeutics and Medical Executive Committees, the antimicrobial dosage will be adjusted accordingly.  Current antimicrobial dosage:  Vancomycin  1750mg  IV q24h  Indication: MRSE Bacteremia  Renal Function:  Estimated Creatinine Clearance: 30.5 mL/min (A) (by C-G formula based on SCr of 5.59 mg/dL (H)). [x]      On intermittent HD, scheduled: MWF (transitioning off CRRT 5/15) []      On CRRT    Antimicrobial dosage has been changed to:  Vancomycin  1000mg  IV qHD   Thank you for allowing pharmacy to be a part of this patient's care.  Harvest Lineman, [facility] 12/08/2023 11:22 AM

## 2023-12-08 NOTE — TOC Progression Note (Signed)
 Transition of Care [facility]) - Progression Note    Patient Details  Name: Phillip Young MRN: [id number] Date of Birth: 01/19/1986  [facility] CM/SW Contact  Tom-Johnson, Angelique Ken, RN Phone Number: 12/08/2023, 2:02 PM  Clinical Narrative:     Patient found to be Covid + with little symptoms. Blood Cx shows MRSE, ID following. On IV abx. On 6L O2. CRRT stopped today  and plan for iHD tomorrow.   Patient not Medically ready for discharge.  CM will continue to follow as patient progresses with care towards discharge.          Expected Discharge Plan and Services                                               Social Determinants of Health (SDOH) Interventions SDOH Screenings   Food Insecurity: No Food Insecurity (12/06/2023)  Recent Concern: Food Insecurity - Food Insecurity Present (11/01/2023)  Housing: High Risk (12/06/2023)  Transportation Needs: Unmet Transportation Needs (12/06/2023)  Utilities: At Risk (12/06/2023)  Alcohol  Screen: Low Risk  (10/06/2023)  Depression (PHQ2-9): High Risk (10/06/2023)  Financial Resource Strain: Low Risk  (10/06/2023)  Physical Activity: Insufficiently Active (10/06/2023)  Social Connections: Socially Isolated (10/06/2023)  Stress: No Stress Concern Present (10/06/2023)  Tobacco Use: Medium Risk (12/06/2023)  Health Literacy: Adequate Health Literacy (10/06/2023)    Readmission Risk Interventions    10/13/2023   11:27 AM 09/23/2023    5:05 PM 07/05/2023    4:25 PM  Readmission Risk Prevention Plan  Transportation Screening Complete Complete Complete  HRI or Home Care Consult   Complete  Social Work Consult for Recovery Care Planning/Counseling   Complete  Palliative Care Screening   Not Applicable  Medication Review Oceanographer) Referral to Pharmacy Referral to Pharmacy Complete  PCP or Specialist appointment within 3-5 days of discharge  Complete   HRI or Home Care Consult Complete Complete   SW Recovery  Care/Counseling Consult Complete Complete   Palliative Care Screening Not Applicable Not Applicable   Skilled Nursing Facility Not Applicable Not Applicable

## 2023-12-08 NOTE — Progress Notes (Signed)
 Pt receives out-pt HD at [facility] on MWF. Will assist as needed.   Olivia Canter Renal Navigator [phone]

## 2023-12-08 NOTE — Plan of Care (Signed)
  Problem: Education: Goal: Ability to describe self-care measures that may prevent or decrease complications (Diabetes Survival Skills Education) will improve Outcome: Progressing Goal: Individualized Educational Video(s) Outcome: Progressing   Problem: Coping: Goal: Ability to adjust to condition or change in health will improve Outcome: Progressing   Problem: Fluid Volume: Goal: Ability to maintain a balanced intake and output will improve Outcome: Progressing   Problem: Health Behavior/Discharge Planning: Goal: Ability to identify and utilize available resources and services will improve Outcome: Progressing Goal: Ability to manage health-related needs will improve Outcome: Progressing   Problem: Metabolic: Goal: Ability to maintain appropriate glucose levels will improve Outcome: Progressing   Problem: Nutritional: Goal: Maintenance of adequate nutrition will improve Outcome: Progressing Goal: Progress toward achieving an optimal weight will improve Outcome: Progressing   Problem: Skin Integrity: Goal: Risk for impaired skin integrity will decrease Outcome: Progressing   Problem: Tissue Perfusion: Goal: Adequacy of tissue perfusion will improve Outcome: Progressing   Problem: Education: Goal: Knowledge of General Education information will improve Description: Including pain rating scale, medication(s)/side effects and non-pharmacologic comfort measures Outcome: Progressing   Problem: Health Behavior/Discharge Planning: Goal: Ability to manage health-related needs will improve Outcome: Progressing   Problem: Clinical Measurements: Goal: Ability to maintain clinical measurements within normal limits will improve Outcome: Progressing Goal: Will remain free from infection Outcome: Progressing Goal: Diagnostic test results will improve Outcome: Progressing Goal: Respiratory complications will improve Outcome: Progressing Goal: Cardiovascular complication will  be avoided Outcome: Progressing   Problem: Activity: Goal: Risk for activity intolerance will decrease Outcome: Progressing   Problem: Nutrition: Goal: Adequate nutrition will be maintained Outcome: Progressing   Problem: Coping: Goal: Level of anxiety will decrease Outcome: Progressing   Problem: Elimination: Goal: Will not experience complications related to bowel motility Outcome: Progressing Goal: Will not experience complications related to urinary retention Outcome: Progressing   Problem: Pain Managment: Goal: General experience of comfort will improve and/or be controlled Outcome: Progressing   Problem: Safety: Goal: Ability to remain free from injury will improve Outcome: Progressing   Problem: Skin Integrity: Goal: Risk for impaired skin integrity will decrease Outcome: Progressing   Problem: Education: Goal: Knowledge of risk factors and measures for prevention of condition will improve Outcome: Progressing   Problem: Coping: Goal: Psychosocial and spiritual needs will be supported Outcome: Progressing   Problem: Respiratory: Goal: Will maintain a patent airway Outcome: Progressing Goal: Complications related to the disease process, condition or treatment will be avoided or minimized Outcome: Progressing

## 2023-12-08 NOTE — Progress Notes (Signed)
 eLink Physician-Brief Progress Note Patient Name: Phillip Young DOB: 01-27-1986 MRN: [id number]   Date of Service  12/08/2023  HPI/Events of Note  Patient having some distal upper extremity cramping, electrolytes are within normal limits.  eICU Interventions  Will order PRN Flexeril  and re-evaluate.        [name] U [name] 12/08/2023, 9:32 PM

## 2023-12-08 NOTE — Progress Notes (Signed)
 NAME:  Phillip Young, MRN:  [id number], DOB:  1985/08/15, LOS: 2 ADMISSION DATE:  12/06/2023, CONSULTATION DATE:  5/13/2 REFERRING MD:  EDP, CHIEF COMPLAINT:  hypotension and hypoxemia   History of Present Illness:   38 yo chronically ill male with PMH significant biventricular HF, chronic Afib on Eliquis , chronic hypotension on midodrine  30mg  TID, ESRD on MWF iHD, chronic hypoxic/ hypercarbic respiratory failure on 2-4L [place] and NIV, severe morbid obesity, and chronic pain presented with SOB, continued increased LE swelling with pain, and weeping from left foot.  Denies recent fever or chills.  Last iHD 5/9.  Found to be hypotensive requiring levophed  support and NIV for WOB in ER.  CXR clear.  BNP 1176, lactate increasing 4.8> 7.9, WBC 11.9 but afebrile.  Concern for sepsis given LLE wound and other wounds> pt unable to lay flat and or examine due to pain.  Cefepime , linezolid , and flagyl  started.   Recent MRSE and E. Faecalis bacteremia 4/8 felt 2/2 HD cath, not a candidate for TEE to rule out endocarditis, on vanc w/ iHD scheduled till 5/25 however pt has been refusing for 2 weeks due to it causing leg pain.  Fifth hospitalization this year.     Pertinent  Medical History  Biventricular HF Chronic Afib on Eliquis   ESRD MWF Chronic hypotension on midodrine  Chronic hypoxic/ hypercarbic respiratory failure Severe morbid obesity LLE cellulitis with wound Sacral decub> resolved Chronic macrocytic anemia Chronic pain HITT- 2022, SRA + Suspected prior amiodarone  toxicity   Significant Hospital Events: Including procedures, antibiotic start and stop dates in addition to other pertinent events   Admitted to ICU 5/13 5/13 started on CRRT, weaning NE, SARS + 5/15 off NE   Interim History / Subjective:   Off NE   Fib rates 100-130s   No CBC. BMP ok -- phos a little high and Na a little low. Cr/BUN as expected   Objective    Blood pressure (!) 134/106, pulse (!) 113, temperature 98.1  F (36.7 C), temperature source Oral, resp. rate 16, height 6' (1.829 m), weight (!) 184.3 kg, SpO2 (!) 82%.        Intake/Output Summary (Last 24 hours) at 12/08/2023 0926 Last data filed at 12/08/2023 0800 Gross per 24 hour  Intake 2615.05 ml  Output 5996 ml  Net -3380.95 ml   Filed Weights   12/06/23 0213 12/07/23 0415  Weight: (!) 181.4 kg (!) 184.3 kg   Examination:   General:  chronically ill obese M NAD HEENT:NCAT redundant neck tissue Neuro: AAOx4  CV: irreg. Distant heart tones cap refill brisk PULM:  diminished , no accessory use  GI: obese soft  Extremities: edematous. No acute appearing deformity  Skin: clean dry     5/13 MRSA PCR > neg 5/13 resp panel > SARS + 5/13 Bcx2> MRSE  Resolved problem list  AGMA Shock  Assessment and Plan   AoC resp failure w hypoxia Suspected OSA/OHS COVID 19 positive  P -noct NIV -IS, pulm hygiene, mobility  -supplemental O2 for goal > 90  -iso precautions   AoC BiV HF Chronic hypotension Afib on eliquis   P -eliquis  -midodrine  -dig  -optimize lytes   MRSE bacteremia -vanc   ESRD on MWF HD  Hypervolemic hyponatremia Hyperphosphatemia  - TDC placed 11/10/23 P -will talk  w nephro and see if we can transition from CRRT to iHD   Anemia of chronic dz Coagulopathy  P -Follow CBC and coags PRN   DM2 P -SSI   Hypothyroidism -  synthroid    Morbid obesity  Physical deconditioning  Chronic wounds P -PT consult  -fq repositioning  -prn wound care   Chronic pain/ polyneuropathy  - cont lyrica , fentanyl  12 mcg patch, and oxy prn w/ bowel regimen - cont effexor    GOC Code status discussion -from prior PCCM discussions:  " DNI but ok with CPR and other meds/ interventions. Discussed again this am, if decompensates> pt defers intubation decision to mother.  Pt seems to have poor insight into disease issue/ severity.  Challenging at times to discuss health issues and manage care as pt manipulates certain  meds and care essential to his recovery.  His mistrust with prior healthcare experiences is contributing but continued support and reassurance is ongoing to re-establish trust"  Best Practice (right click and "Reselect all SmartList Selections" daily)   Diet/type: renal/ heart healthy DVT prophylaxis SCDeliquis Pressure ulcer(s): present on admission  GI prophylaxis: PPI Lines: N/A  Foley:  N/A Code Status:  full code> does not want intubation > defer to mom in event of arrest Last date of multidisciplinary goals of care discussion [12/07/23]  Labs   CBC: Recent Labs  Lab 12/06/23 0216 12/06/23 0323 12/07/23 0551  WBC 11.9*  --  12.5*  NEUTROABS 10.3*  --   --   HGB 10.7* 10.2* 10.7*  HCT 33.8* 30.0* 32.4*  MCV 100.3*  --  96.7  PLT 262  --  271    Basic Metabolic Panel: Recent Labs  Lab 12/06/23 1541 12/07/23 0551 12/07/23 0552 12/07/23 1526 12/08/23 0220  NA 129* 129* 130* 130* 132*  K 5.2* 4.8 4.7 4.4 4.4  CL 92* 94* 94* 97* 94*  CO2 14* 16* 17* 18* 23  GLUCOSE 102* 126* 124* 106* 91  BUN 49* 39* 39* 34* 28*  CREATININE 9.57* 7.49* 7.50* 6.25* 5.59*  CALCIUM  8.8* 8.9 9.1 8.8* 8.9  MG  --  2.3  --   --  2.4  PHOS 7.5*  --  6.3* 5.4* 5.1*   GFR: Estimated Creatinine Clearance: 30.5 mL/min (A) (by C-G formula based on SCr of 5.59 mg/dL (H)). Recent Labs  Lab 12/06/23 0216 12/06/23 0232 12/06/23 0328 12/06/23 0430 12/07/23 0551  WBC 11.9*  --   --   --  12.5*  LATICACIDVEN  --  4.8* 7.9* >9.0*  --     Liver Function Tests: Recent Labs  Lab 12/06/23 0216 12/06/23 1541 12/07/23 0551 12/07/23 0552 12/07/23 1526 12/08/23 0220  AST 25  --  27  --   --   --   ALT 15  --  16  --   --   --   ALKPHOS 239*  --  187*  --   --   --   BILITOT 2.3*  --  3.4*  --   --   --   PROT 7.8  --  7.2  --   --   --   ALBUMIN  3.3* 3.0* 3.0* 3.0* 2.9* 2.9*   No results for input(s): "LIPASE", "AMYLASE" in the last 168 hours. No results for input(s): "AMMONIA" in the  last 168 hours.  ABG    Component Value Date/Time   PHART 7.31 (L) 03/08/2023 2030   PCO2ART 37 03/08/2023 2030   PO2ART 127 (H) 03/08/2023 2030   HCO3 15.9 (L) 12/06/2023 0323   TCO2 17 (L) 12/06/2023 0323   ACIDBASEDEF 10.0 (H) 12/06/2023 0323   O2SAT 98 12/06/2023 0323     Coagulation Profile: Recent Labs  Lab 12/06/23  0430  INR 3.5*    Cardiac Enzymes: Recent Labs  Lab 12/07/23 0551  CKTOTAL 30*    HbA1C: Hgb A1c MFr Bld  Date/Time Value Ref Range Status  12/07/2023 05:51 AM 5.9 (H) 4.8 - 5.6 % Final    Comment:    (NOTE)         Prediabetes: 5.7 - 6.4         Diabetes: >6.4         Glycemic control for adults with diabetes: <7.0   12/06/2023 02:16 AM 6.2 (H) 4.8 - 5.6 % Final    Comment:    (NOTE)         Prediabetes: 5.7 - 6.4         Diabetes: >6.4         Glycemic control for adults with diabetes: <7.0     CBG: Recent Labs  Lab 12/07/23 1542 12/07/23 1950 12/07/23 2310 12/08/23 0254 12/08/23 0749  GLUCAP 108* 102* 141* 115* 147*     CRITICAL CARE Performed by: Delories Fetter   Total critical care time: 41 minutes  Critical care time was exclusive of separately billable procedures and treating other patients. Critical care was necessary to treat or prevent imminent or life-threatening deterioration.  Critical care was time spent personally by me on the following activities: development of treatment plan with patient and/or surrogate as well as nursing, discussions with consultants, evaluation of patient's response to treatment, examination of patient, obtaining history from patient or surrogate, ordering and performing treatments and interventions, ordering and review of laboratory studies, ordering and review of radiographic studies, pulse oximetry and re-evaluation of patient's condition.  Eston Hence MSN, AGACNP-BC [facility] Amion for pager  12/08/2023, 9:26 AM

## 2023-12-08 NOTE — Progress Notes (Signed)
 [facility] Progress Note  Subjective:  Net I/O since admit = net neg 3.8 L  I/O yest 2.1 L net negative ICU team can't pull him more than 150 cc/hr for risk of severe afib/ RVR Pt w/o new /co's   Vitals:   12/08/23 1000 12/08/23 1100 12/08/23 1123 12/08/23 1200  BP: 100/84 (!) 87/58  (!) 85/75  Pulse: (!) 131 (!) 101    Resp: (!) 22 (!) 27  (!) 29  Temp:   98.3 F (36.8 C)   TempSrc:   Oral   SpO2: 91% (!) 81%    Weight:      Height:        Exam: Gen alert, acutely and chronically ill nasal O2 not in respiratory distress No jvd  Chest clear bilat to bases RRR no RG Abd soft morbidly obese ntnd no mass or ascites +bs Ext diffuse 2+ bilateral pitting edema, tender to the touch Neuro is alert, Ox 3 , nf, deconditioned     TDC intact       Renal-related home meds: Sensipar  30 mg MWF Midodrine  30 mg 3 times daily Renvela  2 AC 3 times daily      OP HD: MWF [facility] 4.5h  B400   180kg   TDC   NO heparin  (HIT)       Assessment/ Plan: Acute on chronic hypoxic and hypercarbic resp failure. CXR w/ some IS pulm edema. Started CRRT 5/13. Was 15 L [place] and now down to 6 L [place]. Not able to pull larger amts due to afib/ RVR. Have d/w CCM, will d/c CRRT now and plan for iHD tomorrow.  Chronic vol overload ESRD: on HD MWF. CRRT started on 5/13. Will dc CRRT. HD tomorrow.  Chronic hypotension: takes 30mg  tid of midodrine  at home, cont here, off pressors Anemia of esrd: Hb 10, follow Secondary hyperparathyroidism: CCa in range, follow, cont binders w/ meals.     Larry Poag MD  [facility] 12/08/2023, 12:53 PM  Recent Labs  Lab 12/06/23 0323 12/06/23 1541 12/07/23 0551 12/07/23 0552 12/07/23 1526 12/08/23 0220  HGB 10.2*  --  10.7*  --   --   --   ALBUMIN   --    < > 3.0*   < > 2.9* 2.9*  CALCIUM   --    < > 8.9   < > 8.8* 8.9  PHOS  --    < >  --    < > 5.4* 5.1*  CREATININE  --    < > 7.49*   < > 6.25* 5.59*  K 4.4   < > 4.8   < > 4.4 4.4   < > = values in this  interval not displayed.   No results for input(s): "IRON", "TIBC", "FERRITIN" in the last 168 hours. Inpatient medications:  (feeding supplement) PROSource Plus  30 mL Oral TID BM   apixaban   5 mg Oral BID   Chlorhexidine  Gluconate Cloth  6 each Topical Daily   digoxin   0.125 mg Oral Once per day on Monday Wednesday Friday   feeding supplement  237 mL Oral BID BM   fentaNYL   1 patch Transdermal Q72H   levothyroxine   25 mcg Oral Q0600   midodrine   30 mg Oral TID WC   multivitamin  1 tablet Oral QHS   pantoprazole   40 mg Oral Daily   polyethylene glycol  17 g Oral Daily   pregabalin   50 mg Oral Daily   venlafaxine  XR  37.5 mg  Oral Daily    anticoagulant sodium citrate      prismasol  BGK 4/2.5 1,500 mL/hr at 12/08/23 1148   prismasol  BGK 4/2.5 400 mL/hr at 12/08/23 0243   prismasol  BGK 4/2.5 400 mL/hr at 12/08/23 0239   [START ON 12/10/2023] vancomycin      vancomycin  175 mL/hr at 12/08/23 1200   docusate sodium , ondansetron  (ZOFRAN ) IV, mouth rinse, oxyCODONE 

## 2023-12-09 DIAGNOSIS — I4891 Unspecified atrial fibrillation: Secondary | ICD-10-CM | POA: Diagnosis not present

## 2023-12-09 DIAGNOSIS — R570 Cardiogenic shock: Secondary | ICD-10-CM | POA: Diagnosis not present

## 2023-12-09 DIAGNOSIS — N186 End stage renal disease: Secondary | ICD-10-CM | POA: Diagnosis not present

## 2023-12-09 DIAGNOSIS — I959 Hypotension, unspecified: Secondary | ICD-10-CM | POA: Diagnosis not present

## 2023-12-09 LAB — CULTURE, BLOOD (ROUTINE X 2)

## 2023-12-09 LAB — RENAL FUNCTION PANEL
Albumin: 2.8 g/dL — ABNORMAL LOW (ref 3.5–5.0)
Albumin: 2.7 g/dL — ABNORMAL LOW (ref 3.5–5.0)
Anion gap: 14 (ref 5–15)
Anion gap: 14 (ref 5–15)
BUN: 29 mg/dL — ABNORMAL HIGH (ref 6–20)
BUN: 23 mg/dL — ABNORMAL HIGH (ref 6–20)
CO2: 22 mmol/L (ref 22–32)
CO2: 22 mmol/L (ref 22–32)
Calcium: 9.2 mg/dL (ref 8.9–10.3)
Calcium: 9.3 mg/dL (ref 8.9–10.3)
Chloride: 94 mmol/L — ABNORMAL LOW (ref 98–111)
Chloride: 94 mmol/L — ABNORMAL LOW (ref 98–111)
Creatinine, Ser: 5.69 mg/dL — ABNORMAL HIGH (ref 0.61–1.24)
Creatinine, Ser: 4.7 mg/dL — ABNORMAL HIGH (ref 0.61–1.24)
GFR, Estimated: 12 mL/min — ABNORMAL LOW (ref >60)
GFR, Estimated: 15 mL/min — ABNORMAL LOW (ref >60)
Glucose, Bld: 77 mg/dL (ref 70–99)
Glucose, Bld: 93 mg/dL (ref 70–99)
Phosphorus: 5.1 mg/dL — ABNORMAL HIGH (ref 2.5–4.6)
Phosphorus: 4.4 mg/dL (ref 2.5–4.6)
Potassium: 4.3 mmol/L (ref 3.5–5.1)
Potassium: 4.7 mmol/L (ref 3.5–5.1)
Sodium: 130 mmol/L — ABNORMAL LOW (ref 135–145)
Sodium: 130 mmol/L — ABNORMAL LOW (ref 135–145)

## 2023-12-09 LAB — GLUCOSE, CAPILLARY
Glucose-Capillary: 102 mg/dL — ABNORMAL HIGH (ref 70–99)
Glucose-Capillary: 110 mg/dL — ABNORMAL HIGH (ref 70–99)
Glucose-Capillary: 112 mg/dL — ABNORMAL HIGH (ref 70–99)
Glucose-Capillary: 157 mg/dL — ABNORMAL HIGH (ref 70–99)
Glucose-Capillary: 73 mg/dL (ref 70–99)
Glucose-Capillary: 90 mg/dL (ref 70–99)

## 2023-12-09 LAB — HEPATITIS B SURFACE ANTIBODY, QUANTITATIVE: Hep B S AB Quant (Post): 85.3 m[IU]/mL

## 2023-12-09 LAB — MAGNESIUM: Magnesium: 2.4 mg/dL (ref 1.7–2.4)

## 2023-12-09 MED ORDER — LIDOCAINE-PRILOCAINE 2.5-2.5 % EX CREA: 1.0000 | TOPICAL_CREAM | CUTANEOUS | Status: DC | PRN

## 2023-12-09 MED ORDER — SENNA 8.6 MG PO TABS
1.0000 | ORAL_TABLET | Freq: Every day | ORAL | Status: DC
  Filled 2023-12-09 (×6): qty 1

## 2023-12-09 MED ORDER — PENTAFLUOROPROP-TETRAFLUOROETH EX AERO: 1.0000 | INHALATION_SPRAY | CUTANEOUS | Status: DC | PRN

## 2023-12-09 MED ORDER — ALTEPLASE 2 MG IJ SOLR: 2.0000 mg | Freq: Once | INTRAMUSCULAR | Status: DC | PRN

## 2023-12-09 MED ORDER — NEPRO/CARBSTEADY PO LIQD: 237.0000 mL | ORAL | Status: DC | PRN

## 2023-12-09 MED ORDER — ANTICOAGULANT SODIUM CITRATE 4% (200MG/5ML) IV SOLN
5.0000 mL | Status: DC | PRN
  Administered 2023-12-09: 5 mL
  Filled 2023-12-09: qty 5

## 2023-12-09 MED ORDER — LIDOCAINE HCL (PF) 1 % IJ SOLN: 5.0000 mL | INTRAMUSCULAR | Status: DC | PRN

## 2023-12-09 NOTE — Progress Notes (Addendum)
 Received patient in bed to unit. 1O10 Alert and oriented. A&O x4 Informed consent signed and in chart. Yes  TX duration:3.5  Patient tolerated well. Bedside Txcomplete Alert, without acute distress.  Hand-off given to patient's nurse. Johnathan RN  Access used: Right Chest CVC Access issues: None  Total UF removed: 5000 Medication(s) given: None Post HD weight: 177.31Kg Post HD VS: 93/69, 79 MAP, 130 P, 86% on 4L, 22 Resp   Brodie Correll S Laiza Veenstra RN, DNP [facility]

## 2023-12-09 NOTE — Progress Notes (Signed)
 [facility] Progress Note  Subjective:  On HD goal 5 L off    Vitals:   12/09/23 1031 12/09/23 1045 12/09/23 1115 12/09/23 1146  BP: 110/76 108/80 103/84   Pulse:      Resp:      Temp:    (!) 96.1 F (35.6 C)  TempSrc:    Axillary  SpO2:      Weight:      Height:        Exam: Gen alert, acutely/ chronically ill nasal O2 not in resp distress No jvd  Chest clear bilat to bases RRR no RG Abd soft morbidly obese ntnd no mass or ascites +bs Ext diffuse 2+ bilateral pitting edema Neuro is alert, Ox 3 , nf, deconditioned     TDC intact       Renal-related home meds: Sensipar  30 mg MWF Midodrine  30 mg 3 times daily Renvela  2 AC 3 times daily      OP HD: MWF [facility] 4.5h  B400   180kg   TDC   NO heparin  (HIT)       Assessment/ Plan: Acute on chronic hypoxic / hypercarbic resp failure. Admit CXR w/ some IS pulm edema. Started CRRT 5/13. Was 15 L [place] then > 6L [place] > now 4 L [place]. CRRT was not efficient, transitioned back to iHD which is today.   Chronic vol overload: hard to tell how much of wt gain is tissue vs fluid ESRD: on HD MWF. CRRT 5/13- 5/15. Doing iHD now, HD in progress.  Chronic hypotension: takes 30mg  tid of midodrine  at home, cont here, off pressors Anemia of esrd: Hb 10, follow Secondary hyperparathyroidism: CCa in range, follow, cont binders w/ meals. Severe bivent CHF: last echo 12/06/23 w/ EF < 20% and RV function severely reduced Atrial fib: another factor that can affect good dialysis GOC: overall prognosis is poor     Rob Zana Hesselbach MD  [facility] 12/09/2023, 11:56 AM  Recent Labs  Lab 12/06/23 0323 12/06/23 1541 12/07/23 0551 12/07/23 0552 12/08/23 1526 12/09/23 0451  HGB 10.2*  --  10.7*  --   --   --   ALBUMIN   --    < > 3.0*   < > 2.7* 2.8*  CALCIUM   --    < > 8.9   < > 8.7* 9.2  PHOS  --    < >  --    < > 4.5 5.1*  CREATININE  --    < > 7.49*   < > 5.10* 5.69*  K 4.4   < > 4.8   < > 4.5 4.3   < > = values in this interval not  displayed.   No results for input(s): "IRON", "TIBC", "FERRITIN" in the last 168 hours. Inpatient medications:  (feeding supplement) PROSource Plus  30 mL Oral TID BM   apixaban   5 mg Oral BID   Chlorhexidine  Gluconate Cloth  6 each Topical Q0600   digoxin   0.125 mg Oral Once per day on Monday Wednesday Friday   feeding supplement  237 mL Oral BID BM   fentaNYL   1 patch Transdermal Q72H   levothyroxine   25 mcg Oral Q0600   midodrine   30 mg Oral TID WC   multivitamin  1 tablet Oral QHS   pantoprazole   40 mg Oral Daily   polyethylene glycol  17 g Oral Daily   pregabalin   50 mg Oral Daily   senna  1 tablet Oral Daily   venlafaxine  XR  37.5 mg Oral Daily    anticoagulant sodium citrate      vancomycin      alteplase , anticoagulant sodium citrate , cyclobenzaprine , docusate sodium , feeding supplement (NEPRO CARB STEADY), lidocaine  (PF), lidocaine -prilocaine , ondansetron  (ZOFRAN ) IV, mouth rinse, oxyCODONE , pentafluoroprop-tetrafluoroeth

## 2023-12-09 NOTE — Progress Notes (Signed)
 NAME:  Phillip Young, MRN:  [id number], DOB:  02-24-1986, LOS: 3 ADMISSION DATE:  12/06/2023, CONSULTATION DATE:  5/13/2 REFERRING MD:  EDP, CHIEF COMPLAINT:  hypotension and hypoxemia   History of Present Illness:   38 yo chronically ill male with PMH significant biventricular HF, chronic Afib on Eliquis , chronic hypotension on midodrine  30mg  TID, ESRD on MWF iHD, chronic hypoxic/ hypercarbic respiratory failure on 2-4L [place] and NIV, severe morbid obesity, and chronic pain presented with SOB, continued increased LE swelling with pain, and weeping from left foot.  Denies recent fever or chills.  Last iHD 5/9.  Found to be hypotensive requiring levophed  support and NIV for WOB in ER.  CXR clear.  BNP 1176, lactate increasing 4.8> 7.9, WBC 11.9 but afebrile.  Concern for sepsis given LLE wound and other wounds> pt unable to lay flat and or examine due to pain.  Cefepime , linezolid , and flagyl  started.   Recent MRSE and E. Faecalis bacteremia 4/8 felt 2/2 HD cath, not a candidate for TEE to rule out endocarditis, on vanc w/ iHD scheduled till 5/25 however pt has been refusing for 2 weeks due to it causing leg pain.  Fifth hospitalization this year.     Pertinent  Medical History  Biventricular HF Chronic Afib on Eliquis   ESRD MWF Chronic hypotension on midodrine  Chronic hypoxic/ hypercarbic respiratory failure Severe morbid obesity LLE cellulitis with wound Sacral decub> resolved Chronic macrocytic anemia Chronic pain HITT- 2022, SRA + Suspected prior amiodarone  toxicity   Significant Hospital Events: Including procedures, antibiotic start and stop dates in addition to other pertinent events   Admitted to ICU 5/13 5/13 started on CRRT, weaning NE, SARS + 5/15 off NE  5/16 transition to iHD. Likely txf out of ICU   Interim History / Subjective:   Remained off NE  Did not wear NIV overnight  Not eating well, says food doesn't have a taste  Objective    Blood pressure 103/68, pulse  (!) 122, temperature 98.3 F (36.8 C), resp. rate (!) 24, height 6' (1.829 m), weight (!) 183.3 kg, SpO2 93%.        Intake/Output Summary (Last 24 hours) at 12/09/2023 8841 Last data filed at 12/08/2023 1800 Gross per 24 hour  Intake 1221.54 ml  Output 1004.5 ml  Net 217.04 ml   Filed Weights   12/06/23 0213 12/07/23 0415 12/09/23 0900  Weight: (!) 181.4 kg (!) 184.3 kg (!) 183.3 kg   Examination:   General:  chronically and acutely ill adult M NAD  HEENT: NCAT pink mm anicteric sclera redundant neck tissue  Neuro: AAOx4  CV: irir cap refill < 3 sec  PULM:  Even and unlabored  GI: obese round  Extremities: edematous BLE  Skin: clean dry     5/13 MRSA PCR > neg 5/13 resp panel > SARS + 5/13 Bcx2> MRSE  Resolved problem list  AGMA Shock  Assessment and Plan    AoC resp failure w hypoxia Suspected OSA/OHS COVID-19 positive  P -covid iso precautions  -noct NIV  -IS, pulm hygiene, mobility  -supplemental O2 for goal > 90   AoC BiV failure Chronic hypotension Persistent Afib on eliquis   P -eliquis  -midodrine  (30mg  TID is home dose)  -dig  -optimize lytes   MRSE bacteremia  -vanc   ESRD on MWF HD  Hypervolemic hyponatremia Hyperphosphatemia  - TDC placed 11/10/23 P -transitioned to [facility] 5/16   Anemia of chronic dz P -PRN CBC   DM2 P -SSI  -ongoing  diet edu. At present, is mostly only eating fruit and drinking juice.   Hypothyroidism -synthroid    Morbid obesity  Physical deconditioning  Chronic wounds P -PT consult  -fq repositioning  -prn wound care   Chronic pain/ polyneuropathy  - cont lyrica , fentanyl  12 mcg patch, and oxy prn w/ bowel regimen - cont effexor    GOC Code status discussion -from prior PCCM discussions:  " DNI but ok with CPR and other meds/ interventions. Discussed again this am, if decompensates> pt defers intubation decision to mother.  Pt seems to have poor insight into disease issue/ severity.  Challenging at  times to discuss health issues and manage care as pt manipulates certain meds and care essential to his recovery.  His mistrust with prior healthcare experiences is contributing but continued support and reassurance is ongoing to re-establish trust"  Dispo:  -remains HDS off of NE -txf to progressive.  -will ask IMTS to take over care 5/17   Best Practice (right click and "Reselect all SmartList Selections" daily)   Diet/type: renal/ heart healthy DVT prophylaxis SCDeliquis Pressure ulcer(s): present on admission  GI prophylaxis: PPI Lines: N/A  Foley:  N/A Code Status:  full code  See above. Has indicated he wouldn't really want intubation, but also that he would defer intubation decision to his mother. When teasing this out, he is est as full code.   Labs   CBC: Recent Labs  Lab 12/06/23 0216 12/06/23 0323 12/07/23 0551  WBC 11.9*  --  12.5*  NEUTROABS 10.3*  --   --   HGB 10.7* 10.2* 10.7*  HCT 33.8* 30.0* 32.4*  MCV 100.3*  --  96.7  PLT 262  --  271    Basic Metabolic Panel: Recent Labs  Lab 12/07/23 0551 12/07/23 0552 12/07/23 1526 12/08/23 0220 12/08/23 1526 12/09/23 0451  NA 129* 130* 130* 132* 132* 130*  K 4.8 4.7 4.4 4.4 4.5 4.3  CL 94* 94* 97* 94* 95* 94*  CO2 16* 17* 18* 23 23 22   GLUCOSE 126* 124* 106* 91 60* 77  BUN 39* 39* 34* 28* 26* 29*  CREATININE 7.49* 7.50* 6.25* 5.59* 5.10* 5.69*  CALCIUM  8.9 9.1 8.8* 8.9 8.7* 9.2  MG 2.3  --   --  2.4  --  2.4  PHOS  --  6.3* 5.4* 5.1* 4.5 5.1*   GFR: Estimated Creatinine Clearance: 29.9 mL/min (A) (by C-G formula based on SCr of 5.69 mg/dL (H)). Recent Labs  Lab 12/06/23 0216 12/06/23 0232 12/06/23 0328 12/06/23 0430 12/07/23 0551  WBC 11.9*  --   --   --  12.5*  LATICACIDVEN  --  4.8* 7.9* >9.0*  --     Liver Function Tests: Recent Labs  Lab 12/06/23 0216 12/06/23 1541 12/07/23 0551 12/07/23 0552 12/07/23 1526 12/08/23 0220 12/08/23 1526 12/09/23 0451  AST 25  --  27  --   --   --    --   --   ALT 15  --  16  --   --   --   --   --   ALKPHOS 239*  --  187*  --   --   --   --   --   BILITOT 2.3*  --  3.4*  --   --   --   --   --   PROT 7.8  --  7.2  --   --   --   --   --   ALBUMIN  3.3*   < >  3.0* 3.0* 2.9* 2.9* 2.7* 2.8*   < > = values in this interval not displayed.   No results for input(s): "LIPASE", "AMYLASE" in the last 168 hours. No results for input(s): "AMMONIA" in the last 168 hours.  ABG    Component Value Date/Time   PHART 7.31 (L) 03/08/2023 2030   PCO2ART 37 03/08/2023 2030   PO2ART 127 (H) 03/08/2023 2030   HCO3 15.9 (L) 12/06/2023 0323   TCO2 17 (L) 12/06/2023 0323   ACIDBASEDEF 10.0 (H) 12/06/2023 0323   O2SAT 98 12/06/2023 0323     Coagulation Profile: Recent Labs  Lab 12/06/23 0430  INR 3.5*    Cardiac Enzymes: Recent Labs  Lab 12/07/23 0551  CKTOTAL 30*    HbA1C: Hgb A1c MFr Bld  Date/Time Value Ref Range Status  12/07/2023 05:51 AM 5.9 (H) 4.8 - 5.6 % Final    Comment:    (NOTE)         Prediabetes: 5.7 - 6.4         Diabetes: >6.4         Glycemic control for adults with diabetes: <7.0   12/06/2023 02:16 AM 6.2 (H) 4.8 - 5.6 % Final    Comment:    (NOTE)         Prediabetes: 5.7 - 6.4         Diabetes: >6.4         Glycemic control for adults with diabetes: <7.0     CBG: Recent Labs  Lab 12/08/23 1537 12/08/23 2007 12/09/23 0009 12/09/23 0416 12/09/23 0748  GLUCAP 100* 133* 90 73 157*     CCT na   High MDM   Eston Hence MSN, AGACNP-BC [facility] Amion for pager  12/09/2023, 9:37 AM

## 2023-12-09 NOTE — Progress Notes (Signed)
 IMTS will assume care of this patient on 5/17 at 7am.    Phillip Young Phillip Leeyah [name], DO Internal Medicine Resident, PGY-3 Please contact the on call pager at [phone] for any urgent or emergent needs. 11:18 AM 12/09/2023

## 2023-12-09 NOTE — Evaluation (Signed)
 Physical Therapy Evaluation Patient Details Name: Phillip Young MRN: [id number] DOB: 1985-10-04 Today's Date: 12/09/2023  History of Present Illness  38 yo man admitted 5/13 with hypotension and shock.  COVID positive with respiratory issues as well.  PMH:  ESRD on HD, biventricular heart failure EF<20%, morbid obesity, chronic respiratory failure on 4LNC during the day, NIV  Clinical Impression  Pt admitted with above diagnosis. Nurse asked PT to perform bed level evaluation therefore pt initiated UE and LE exercises in bed.  Pt not moving left LE much at all due to edema.  Exercises with all 3 other extremities. Pt with low BP 80's/50's and nurse didn't want pt moved to EOB as well as elevated HR with little activity. Will follow acutely and once pt to EOB, will determine appropriate d/c plan.  Pt currently with functional limitations due to the deficits listed below (see PT Problem List). Pt will benefit from acute skilled PT to increase their independence and safety with mobility to allow discharge.           If plan is discharge home, recommend the following: A little help with walking and/or transfers;A little help with bathing/dressing/bathroom;Assistance with cooking/housework;Help with stairs or ramp for entrance;Assist for transportation   Can travel by private vehicle        Equipment Recommendations None recommended by PT  Recommendations for Other Services       Functional Status Assessment Patient has had a recent decline in their functional status and demonstrates the ability to make significant improvements in function in a reasonable and predictable amount of time.     Precautions / Restrictions Precautions Precautions: Fall Precaution/Restrictions Comments: COVID on precautions Restrictions Weight Bearing Restrictions Per Provider Order: No      Mobility  Bed Mobility Overal bed mobility: Needs Assistance Bed Mobility: Rolling Rolling: Min assist          General bed mobility comments: Pt can roll with bed rails and a little assist for LEs.  Nurse asked PT to perform bed level eval with exercises so kept pt in bed.  At one point, pt asked PT to move the rail so he could sit up.  Pt wanted to clarify with nurse as pt with BP 80/50 and this upset pt and he asked PT to leave his room.    Transfers                   General transfer comment: Unable today    Ambulation/Gait               General Gait Details: Doesnt walk at home  Stairs            Wheelchair Mobility     Tilt Bed    Modified Rankin (Stroke Patients Only)       Balance                                             Pertinent Vitals/Pain Pain Assessment Pain Assessment: 0-10 Pain Score: 10-Worst pain ever Pain Location: LEs Pain Descriptors / Indicators: Aching, Discomfort, Grimacing, Guarding Pain Intervention(s): Limited activity within patient's tolerance, Monitored during session, Repositioned    Home Living Family/patient expects to be discharged to:: Private residence Living Arrangements: Parent Available Help at Discharge: Family;Available 24 hours/day Type of Home: Apartment Home Access: Level entry  Home Layout: One level Home Equipment: Wheelchair - Forensic psychologist (2 wheels);BSC/3in1;Hospital bed;Shower seat;Other (comment) Additional Comments: Recieving HH nursing care . Pt then also requires to get up a hill to be able to enter/exit the home    Prior Function Prior Level of Function : Needs assist       Physical Assist : Mobility (physical);ADLs (physical)     Mobility Comments: Pt reported they would complete stand transfers to [facility] and standing tolerance of 30 seconds. ADLs Comments: Pt reported they would try to complete what they could     Extremity/Trunk Assessment   Upper Extremity Assessment Upper Extremity Assessment: Defer to OT evaluation    Lower Extremity Assessment Lower  Extremity Assessment: RLE deficits/detail;LLE deficits/detail RLE Deficits / Details: drop foot with 1/5 movement, knee and hip 3/5 LLE Deficits / Details: Edema therefore couldnt move ankle or leg       Communication   Communication Communication: No apparent difficulties Factors Affecting Communication: Reduced clarity of speech;Difficulty expressing self (Pt reported that they have difficulties with speech due to damaged voice box and speaked softly unless yelling at therapist)    Cognition Arousal: Alert Behavior During Therapy: Impulsive, Restless   PT - Cognitive impairments: Safety/Judgement                                 Cueing       General Comments General comments (skin integrity, edema, etc.): 129 bpm, 88% 10LO2 with raising self slightly in bed, 95% at rest, 84/49 at rest on arrival    Exercises General Exercises - Upper Extremity Shoulder Flexion: AROM, Both, 5 reps, Supine Shoulder Extension: AROM, Both, 5 reps, Supine Elbow Flexion: AROM, Both, 5 reps, Supine Elbow Extension: AROM, Both, 5 reps, Supine General Exercises - Lower Extremity Ankle Circles/Pumps: AROM, 5 reps, Supine, Right Quad Sets: AROM, Right, 5 reps, Supine Gluteal Sets: AROM, Both, 5 reps, Supine Heel Slides: Right, AROM, 10 reps, Supine   Assessment/Plan    PT Assessment Patient needs continued PT services  PT Problem List Decreased activity tolerance;Decreased balance;Decreased mobility;Decreased knowledge of use of DME;Decreased safety awareness;Decreased knowledge of precautions;Decreased strength;Decreased range of motion;Pain;Obesity       PT Treatment Interventions DME instruction;Functional mobility training;Therapeutic activities;Therapeutic exercise;Balance training;Patient/family education    PT Goals (Current goals can be found in the Care Plan section)  Acute Rehab PT Goals Patient Stated Goal: to get better PT Goal Formulation: With patient Time For Goal  Achievement: 12/23/23 Potential to Achieve Goals: Fair    Frequency Min 2X/week     Co-evaluation               AM-PAC PT "6 Clicks" Mobility  Outcome Measure Help needed turning from your back to your side while in a flat bed without using bedrails?: A Little Help needed moving from lying on your back to sitting on the side of a flat bed without using bedrails?: Total Help needed moving to and from a bed to a chair (including a wheelchair)?: Total Help needed standing up from a chair using your arms (e.g., wheelchair or bedside chair)?: Total Help needed to walk in hospital room?: Total Help needed climbing 3-5 steps with a railing? : Total 6 Click Score: 8    End of Session   Activity Tolerance: Patient limited by fatigue;Patient limited by pain Patient left: in bed;with call bell/phone within reach Nurse Communication: Mobility status PT Visit Diagnosis: Other  abnormalities of gait and mobility (R26.89);Muscle weakness (generalized) (M62.81);Pain Pain - Right/Left: Left Pain - part of body: Leg    Time: 1610-9604 PT Time Calculation (min) (ACUTE ONLY): 11 min   Charges:   PT Evaluation $PT Eval Moderate Complexity: 1 Mod   PT General Charges $$ ACUTE PT VISIT: 1 Visit         Mava Suares M,PT Acute Rehab Services [phone]   Florencia Hunter 12/09/2023, 3:37 PM

## 2023-12-09 NOTE — Progress Notes (Signed)
 ID PROGRESS NOTE  38yo M with MRSE bacteremia, suspect HD line is involved, and will ultimately need to be replaced - continue on vancomycin  with hd session x 6 wk   We will see back on Monday  Suzann Lazaro B. Levern Reader MD MPH [facility] [phone]

## 2023-12-10 DIAGNOSIS — A411 Sepsis due to other specified staphylococcus: Secondary | ICD-10-CM

## 2023-12-10 DIAGNOSIS — N186 End stage renal disease: Secondary | ICD-10-CM

## 2023-12-10 DIAGNOSIS — R579 Shock, unspecified: Secondary | ICD-10-CM

## 2023-12-10 DIAGNOSIS — N049 Nephrotic syndrome with unspecified morphologic changes: Secondary | ICD-10-CM

## 2023-12-10 DIAGNOSIS — Z992 Dependence on renal dialysis: Secondary | ICD-10-CM

## 2023-12-10 LAB — RENAL FUNCTION PANEL
Albumin: 2.8 g/dL — ABNORMAL LOW (ref 3.5–5.0)
Albumin: 2.8 g/dL — ABNORMAL LOW (ref 3.5–5.0)
Anion gap: 15 (ref 5–15)
Anion gap: 15 (ref 5–15)
BUN: 28 mg/dL — ABNORMAL HIGH (ref 6–20)
BUN: 34 mg/dL — ABNORMAL HIGH (ref 6–20)
CO2: 22 mmol/L (ref 22–32)
CO2: 20 mmol/L — ABNORMAL LOW (ref 22–32)
Calcium: 9.4 mg/dL (ref 8.9–10.3)
Calcium: 9.6 mg/dL (ref 8.9–10.3)
Chloride: 92 mmol/L — ABNORMAL LOW (ref 98–111)
Chloride: 95 mmol/L — ABNORMAL LOW (ref 98–111)
Creatinine, Ser: 5.28 mg/dL — ABNORMAL HIGH (ref 0.61–1.24)
Creatinine, Ser: 5.93 mg/dL — ABNORMAL HIGH (ref 0.61–1.24)
GFR, Estimated: 13 mL/min — ABNORMAL LOW (ref >60)
GFR, Estimated: 12 mL/min — ABNORMAL LOW (ref >60)
Glucose, Bld: 82 mg/dL (ref 70–99)
Glucose, Bld: 108 mg/dL — ABNORMAL HIGH (ref 70–99)
Phosphorus: 4.7 mg/dL — ABNORMAL HIGH (ref 2.5–4.6)
Phosphorus: 5.4 mg/dL — ABNORMAL HIGH (ref 2.5–4.6)
Potassium: 4.7 mmol/L (ref 3.5–5.1)
Potassium: 4.9 mmol/L (ref 3.5–5.1)
Sodium: 129 mmol/L — ABNORMAL LOW (ref 135–145)
Sodium: 130 mmol/L — ABNORMAL LOW (ref 135–145)

## 2023-12-10 LAB — CBC
HCT: 34 % — ABNORMAL LOW (ref 39.0–52.0)
Hemoglobin: 10.6 g/dL — ABNORMAL LOW (ref 13.0–17.0)
MCH: 31.1 pg (ref 26.0–34.0)
MCHC: 31.2 g/dL (ref 30.0–36.0)
MCV: 99.7 fL (ref 80.0–100.0)
Platelets: 230 10*3/uL (ref 150–400)
RBC: 3.41 MIL/uL — ABNORMAL LOW (ref 4.22–5.81)
RDW: 16.8 % — ABNORMAL HIGH (ref 11.5–15.5)
WBC: 8.1 10*3/uL (ref 4.0–10.5)
nRBC: 0.2 % (ref 0.0–0.2)

## 2023-12-10 LAB — GLUCOSE, CAPILLARY
Glucose-Capillary: 101 mg/dL — ABNORMAL HIGH (ref 70–99)
Glucose-Capillary: 104 mg/dL — ABNORMAL HIGH (ref 70–99)
Glucose-Capillary: 107 mg/dL — ABNORMAL HIGH (ref 70–99)
Glucose-Capillary: 109 mg/dL — ABNORMAL HIGH (ref 70–99)
Glucose-Capillary: 125 mg/dL — ABNORMAL HIGH (ref 70–99)
Glucose-Capillary: 86 mg/dL (ref 70–99)

## 2023-12-10 LAB — MAGNESIUM: Magnesium: 2.3 mg/dL (ref 1.7–2.4)

## 2023-12-10 MED ORDER — OXYCODONE HCL 5 MG PO TABS
10.0000 mg | ORAL_TABLET | Freq: Once | ORAL | Status: AC
  Administered 2023-12-10: 10 mg via ORAL
  Filled 2023-12-10: qty 2

## 2023-12-10 NOTE — Progress Notes (Signed)
   12/10/23 2337  BiPAP/CPAP/SIPAP  BiPAP/CPAP/SIPAP Pt Type Adult  Mask Type Full face mask  Mask Size Large  Respiratory Rate 20 breaths/min  IPAP 22 cmH20  EPAP 7 cmH2O  Flow Rate 7 lpm  Minute Ventilation 11.2  Tidal Volume (Vt) 660  Patient Home Machine Yes  Safety Check Completed by RT for Home Unit Yes, no issues noted  Patient Home Mask Yes  Patient Home Tubing Yes  CPAP/SIPAP surface wiped down Yes  Device Plugged into RED Power Outlet Yes  BiPAP/CPAP /SiPAP Vitals  Pulse Rate (!) 106  Resp 19  SpO2 99 %  Bilateral Breath Sounds Clear;Diminished  MEWS Score/Color  MEWS Score 1  MEWS Score Color Marrie Sizer

## 2023-12-10 NOTE — Progress Notes (Signed)
   12/10/23 0031  BiPAP/CPAP/SIPAP  $ Non-Invasive Home Ventilator  Subsequent  BiPAP/CPAP/SIPAP Pt Type Adult  Mask Type Full face mask  Mask Size Large  Respiratory Rate 25 breaths/min  IPAP 22 cmH20  EPAP 7 cmH2O  Flow Rate 7 lpm (pt wanted [place] to be left on while using BIPAP)  Minute Ventilation 15.7  Patient Home Machine Yes  Safety Check Completed by RT for Home Unit Yes, no issues noted  Patient Home Mask Yes  Patient Home Tubing Yes  BiPAP/CPAP /SiPAP Vitals  Bilateral Breath Sounds Clear;Diminished

## 2023-12-10 NOTE — Plan of Care (Signed)
 Patient remains noncompliant with fluid restriction, consistent with prior behavior. Continuous to require frequent redirection and education with limited adherence. Slight increased in work of breathing noted; patient complained of SOB and requested increase in oxygen  support. O? delivery increased gradually from 4L to 10L via [place]  per patient's request. Oxygen  saturation improved from low 80s to mid-90s following adjustment. Patient monitored closely; condition stable at end of shift. Will continue to monitor respiratory status and reinforce fluid restriction as tolerated.

## 2023-12-10 NOTE — Progress Notes (Addendum)
 Patient summary: 38 yo chronically ill male with PMH significant biventricular HF (EF < 20%), chronic Afib on Eliquis , chronic hypotension on midodrine  30mg  TID, ESRD on MWF iHD, chronic hypoxic/ hypercarbic respiratory failure on 2-4L [place] and NIV, severe morbid obesity, and chronic pain presented with SOB, continued increased LE swelling with pain, and weeping from left foot. Found to be hypotensive requiring levophed  support and NIV for WOB in ER. Admitted to ICU and is being treated for MRSE bacteremia in addition to other comorbidities. IMTS resuming care 5/17/  Subjective Denies fever, chills. Cough is improving.  Physical exam Blood pressure 96/67, pulse (!) 113, temperature 97.8 F (36.6 C), temperature source Oral, resp. rate (!) 24, height 6' (1.829 m), weight (!) 183.3 kg, SpO2 95%.  Physical Exam: Constitutional: morbidly obese, chronically ill-appearing, NAD Cardiovascular: irregular rate and rhythm Pulmonary/Chest: normal work of breathing on 10 L HFNC, coarse sounds on anterior auscultation Abdominal: soft, non-tender, non-distended Neurological: alert & oriented Skin: HD cath without surrounding signs of infection   Weight change:    Intake/Output Summary (Last 24 hours) at 12/10/2023 0654 Last data filed at 12/10/2023 0424 Gross per 24 hour  Intake 1583 ml  Output --  Net 1583 ml   Net IO Since Admission: -1,020.53 mL [12/10/23 0654]  Labs, images, and other studies    Latest Ref Rng & Units 12/10/2023    3:04 AM 12/07/2023    5:51 AM 12/06/2023    3:23 AM  CBC  WBC 4.0 - 10.5 K/uL 8.1  12.5    Hemoglobin 13.0 - 17.0 g/dL 16.1  09.6  04.5   Hematocrit 39.0 - 52.0 % 34.0  32.4  30.0   Platelets 150 - 400 K/uL 230  271         Latest Ref Rng & Units 12/10/2023    3:04 AM 12/09/2023    4:31 PM 12/09/2023    4:51 AM  BMP  Glucose 70 - 99 mg/dL 82  93  77   BUN 6 - 20 mg/dL 28  23  29    Creatinine 0.61 - 1.24 mg/dL 4.09  8.11  9.14   Sodium  135 - 145 mmol/L 129  130  130   Potassium 3.5 - 5.1 mmol/L 4.7  4.7  4.3   Chloride 98 - 111 mmol/L 92  94  94   CO2 22 - 32 mmol/L 22  22  22    Calcium  8.9 - 10.3 mg/dL 9.4  9.3  9.2      Assessment and plan Hospital day 4  Jawan Chavarria is a 38 y.o.male with PMH significant biventricular HF (EF < 20%), chronic Afib on Eliquis , chronic hypotension on midodrine  30mg  TID, ESRD on MWF iHD, chronic hypoxic/ hypercarbic respiratory failure on 2-4L [place] and NIV, severe morbid obesity, and chronic pain admitted for MRSE bacteremia and management of comorbidities.   MRSE bacteremia, presumed endocarditis due to inabiltiy to tolerate TEE ID following, appreciate recommendations. Afebrile without leukocytosis. Continue Vanc with HD with plan for 6 week course. Will reach out to ID for tentative EOT date. Will repeat BC today.  Acute on chronic hypoxemic and hypercapnic respiratory failure, OHS/OSA Acutely Decompensated HFrEF EF 20% ESRD with volume overload Hyponatremia Incidental covid positive Atrial Fibrillation Continue inpatient HD MWF. Appreciate Nephrology recommendations. Labs are stable. -Continue Digoxin , Eliquis , Midodrine  -Airborne precautions  Chronic Pain -Continue Lyrica  and Effexor  -Oxycodone  prn -  Flexeril  prn  Hypothyroidism -Continue home Synthroid   Goals of Care I had an extensive conversation with him today regarding his overall health and how non-compliance is affecting his overall trajectory.  I feel this was a fruitful conversation.  He states he will be "all in" on medical recommendations.  Ronni Colace, DO 12/10/2023, 6:54 AM  Pager: [phone] After 5pm or weekend: [phone]

## 2023-12-10 NOTE — Progress Notes (Signed)
 [facility] Progress Note  Subjective:  5 L UF w/ HD yesterday  Seen in room, no new c/o's   Vitals:   12/10/23 0800 12/10/23 0813 12/10/23 0900 12/10/23 1002  BP: 94/69  (!) 85/65 96/65  Pulse: (!) 121 (!) 114 (!) 120 (!) 122  Resp: 13 14 (!) 24 14  Temp:  98.5 F (36.9 C)    TempSrc:  Oral    SpO2:  100% 100% 93%  Weight:      Height:        Exam: Gen alert, acutely/ chronically ill nasal O2 not in resp distress No jvd  Chest clear bilat to bases RRR no RG Abd soft morbidly obese ntnd no mass or ascites +bs Ext diffuse 2+ bilateral pitting edema Neuro is alert, Ox 3 , nf, deconditioned     TDC intact       Renal-related home meds: Sensipar  30 mg MWF Midodrine  30 mg 3 times daily Renvela  2 AC 3 times daily      OP HD: MWF [facility] 4.5h  B400   180kg   TDC   NO heparin  (HIT)       Assessment/ Plan: MRSE bacteremia: will need a line holiday. Will consult IR for cath removal after HD Monday.  Acute on chronic hypoxic / hypercarbic resp failure. Admit CXR w/ some IS pulm edema. Started CRRT 5/13. Was 15 L [place] then > 6L [place] > now 4 L [place]. CRRT was not efficient so he was transitioned back to iHD which was done yesterday w/ 5 L off.   Chronic vol overload ESRD: on HD MWF. CRRT 5/13- 5/15. Doing iHD now. Had HD yest. Next HD Monday.  Chronic hypotension: takes 30mg  tid of midodrine  at home, cont here, off pressors Anemia of esrd: Hb 10, follow Secondary hyperparathyroidism: CCa in range, follow, cont binders w/ meals. Severe bivent CHF: last echo 12/06/23 w/ EF < 20% and RV function severely reduced Atrial fib: another factor that can affect good dialysis GOC: overall prognosis is poor     Larry Poag MD  [facility] 12/10/2023, 10:33 AM  Recent Labs  Lab 12/07/23 0551 12/07/23 0552 12/09/23 1631 12/10/23 0304  HGB 10.7*  --   --  10.6*  ALBUMIN  3.0*   < > 2.7* 2.8*  CALCIUM  8.9   < > 9.3 9.4  PHOS  --    < > 4.4 4.7*  CREATININE 7.49*   < > 4.70* 5.28*   K 4.8   < > 4.7 4.7   < > = values in this interval not displayed.   No results for input(s): "IRON", "TIBC", "FERRITIN" in the last 168 hours. Inpatient medications:  (feeding supplement) PROSource Plus  30 mL Oral TID BM   apixaban   5 mg Oral BID   Chlorhexidine  Gluconate Cloth  6 each Topical Q0600   digoxin   0.125 mg Oral Once per day on Monday Wednesday Friday   feeding supplement  237 mL Oral BID BM   fentaNYL   1 patch Transdermal Q72H   levothyroxine   25 mcg Oral Q0600   midodrine   30 mg Oral TID WC   multivitamin  1 tablet Oral QHS   polyethylene glycol  17 g Oral Daily   pregabalin   50 mg Oral Daily   senna  1 tablet Oral Daily   venlafaxine  XR  37.5 mg Oral Daily    anticoagulant sodium citrate      vancomycin  Stopped (12/09/23 1443)   alteplase , anticoagulant sodium citrate ,  cyclobenzaprine , docusate sodium , feeding supplement (NEPRO CARB STEADY), lidocaine  (PF), lidocaine -prilocaine , ondansetron  (ZOFRAN ) IV, mouth rinse, oxyCODONE , pentafluoroprop-tetrafluoroeth

## 2023-12-10 NOTE — Plan of Care (Deleted)
 Patient remains noncompliant with fluid restriction, consistent with prior behavior. Continuous to require frequent redirection and education with limited adherence. Slight increased in work of breathing noted; patient complained of SOB and requested increase in oxygen  support. O? delivery increased from 4L to 10L per nasal cannula gradually per patient's request. Oxygen  saturation improved from low 80s to mid-90s following adjustment. Patient monitored closely; condition stable at end of shift. Will continue to monitor respiratory status and reinforce fluid restriction as tolerated.

## 2023-12-10 NOTE — Plan of Care (Signed)
 Patient did better on fluid consumption.  Did not ask for additional fluids outside meal tray.  BP remains soft and patient on 10 L HFNC.

## 2023-12-11 ENCOUNTER — Inpatient Hospital Stay (HOSPITAL_COMMUNITY)

## 2023-12-11 DIAGNOSIS — N186 End stage renal disease: Secondary | ICD-10-CM | POA: Diagnosis not present

## 2023-12-11 DIAGNOSIS — Z992 Dependence on renal dialysis: Secondary | ICD-10-CM | POA: Diagnosis not present

## 2023-12-11 DIAGNOSIS — A411 Sepsis due to other specified staphylococcus: Secondary | ICD-10-CM | POA: Diagnosis not present

## 2023-12-11 DIAGNOSIS — R579 Shock, unspecified: Secondary | ICD-10-CM | POA: Diagnosis not present

## 2023-12-11 LAB — RENAL FUNCTION PANEL
Albumin: 2.8 g/dL — ABNORMAL LOW (ref 3.5–5.0)
Albumin: 2.8 g/dL — ABNORMAL LOW (ref 3.5–5.0)
Anion gap: 16 — ABNORMAL HIGH (ref 5–15)
Anion gap: 17 — ABNORMAL HIGH (ref 5–15)
BUN: 37 mg/dL — ABNORMAL HIGH (ref 6–20)
BUN: 44 mg/dL — ABNORMAL HIGH (ref 6–20)
CO2: 21 mmol/L — ABNORMAL LOW (ref 22–32)
CO2: 18 mmol/L — ABNORMAL LOW (ref 22–32)
Calcium: 9.6 mg/dL (ref 8.9–10.3)
Calcium: 9.6 mg/dL (ref 8.9–10.3)
Chloride: 92 mmol/L — ABNORMAL LOW (ref 98–111)
Chloride: 91 mmol/L — ABNORMAL LOW (ref 98–111)
Creatinine, Ser: 6.32 mg/dL — ABNORMAL HIGH (ref 0.61–1.24)
Creatinine, Ser: 6.91 mg/dL — ABNORMAL HIGH (ref 0.61–1.24)
GFR, Estimated: 11 mL/min — ABNORMAL LOW (ref >60)
GFR, Estimated: 10 mL/min — ABNORMAL LOW (ref >60)
Glucose, Bld: 87 mg/dL (ref 70–99)
Glucose, Bld: 94 mg/dL (ref 70–99)
Phosphorus: 5.4 mg/dL — ABNORMAL HIGH (ref 2.5–4.6)
Phosphorus: 6 mg/dL — ABNORMAL HIGH (ref 2.5–4.6)
Potassium: 5.4 mmol/L — ABNORMAL HIGH (ref 3.5–5.1)
Potassium: 5.4 mmol/L — ABNORMAL HIGH (ref 3.5–5.1)
Sodium: 129 mmol/L — ABNORMAL LOW (ref 135–145)
Sodium: 126 mmol/L — ABNORMAL LOW (ref 135–145)

## 2023-12-11 LAB — MAGNESIUM: Magnesium: 2.5 mg/dL — ABNORMAL HIGH (ref 1.7–2.4)

## 2023-12-11 LAB — GLUCOSE, CAPILLARY
Glucose-Capillary: 114 mg/dL — ABNORMAL HIGH (ref 70–99)
Glucose-Capillary: 73 mg/dL (ref 70–99)
Glucose-Capillary: 91 mg/dL (ref 70–99)
Glucose-Capillary: 94 mg/dL (ref 70–99)

## 2023-12-11 LAB — CBC
HCT: 33.1 % — ABNORMAL LOW (ref 39.0–52.0)
Hemoglobin: 10.3 g/dL — ABNORMAL LOW (ref 13.0–17.0)
MCH: 31.3 pg (ref 26.0–34.0)
MCHC: 31.1 g/dL (ref 30.0–36.0)
MCV: 100.6 fL — ABNORMAL HIGH (ref 80.0–100.0)
Platelets: 220 10*3/uL (ref 150–400)
RBC: 3.29 MIL/uL — ABNORMAL LOW (ref 4.22–5.81)
RDW: 17 % — ABNORMAL HIGH (ref 11.5–15.5)
WBC: 8.8 10*3/uL (ref 4.0–10.5)
nRBC: 0.3 % — ABNORMAL HIGH (ref 0.0–0.2)

## 2023-12-11 MED ORDER — OXYCODONE HCL 5 MG PO TABS
10.0000 mg | ORAL_TABLET | Freq: Four times a day (QID) | ORAL | Status: AC | PRN
  Administered 2023-12-12 – 2023-12-13 (×3): 10 mg via ORAL
  Filled 2023-12-11 (×5): qty 2

## 2023-12-11 MED ORDER — CHLORHEXIDINE GLUCONATE CLOTH 2 % EX PADS: 6.0000 | MEDICATED_PAD | Freq: Every day | CUTANEOUS | Status: DC

## 2023-12-11 NOTE — Progress Notes (Signed)
                 Subjective Little more SOB this morning but overall doing okay. No fevers/chills. No BM.  Physical exam Blood pressure 105/72, pulse (!) 128, temperature 97.9 F (36.6 C), temperature source Oral, resp. rate 18, height 6' (1.829 m), weight (!) 183 kg, SpO2 97%.  Constitutional: morbidly obese, chronically ill-appearing, NAD Cardiovascular: irregular rate and rhythm Pulmonary/Chest: normal work of breathing on 10 L HFNC, coarse sounds on anterior auscultation Abdominal: soft, non-tender, non-distended Neurological: alert & oriented Skin: HD cath without surrounding signs of infection  Weight change: -0.3 kg   Intake/Output Summary (Last 24 hours) at 12/11/2023 0925 Last data filed at 12/11/2023 0400 Gross per 24 hour  Intake 1320 ml  Output --  Net 1320 ml   Net IO Since Admission: 539.47 mL [12/11/23 0925]  Labs, images, and other studies    Latest Ref Rng & Units 12/11/2023    2:49 AM 12/10/2023    3:04 AM 12/07/2023    5:51 AM  CBC  WBC 4.0 - 10.5 K/uL 8.8  8.1  12.5   Hemoglobin 13.0 - 17.0 g/dL 16.1  09.6  04.5   Hematocrit 39.0 - 52.0 % 33.1  34.0  32.4   Platelets 150 - 400 K/uL 220  230  271        Latest Ref Rng & Units 12/11/2023    2:49 AM 12/10/2023    4:15 PM 12/10/2023    3:04 AM  BMP  Glucose 70 - 99 mg/dL 87  409  82   BUN 6 - 20 mg/dL 37  34  28   Creatinine 0.61 - 1.24 mg/dL 8.11  9.14  7.82   Sodium 135 - 145 mmol/L 129  130  129   Potassium 3.5 - 5.1 mmol/L 5.4  4.9  4.7   Chloride 98 - 111 mmol/L 92  95  92   CO2 22 - 32 mmol/L 21  20  22    Calcium  8.9 - 10.3 mg/dL 9.6  9.6  9.4      Assessment and plan Hospital day 5 Phillip Young is a 38 y.o.male with PMH significant biventricular HF (EF < 20%), chronic Afib on Eliquis , chronic hypotension on midodrine  30mg  TID, ESRD on MWF iHD, chronic hypoxic/ hypercarbic respiratory failure on 2-4L [place] and NIV, severe morbid obesity, and chronic pain admitted for MRSE bacteremia and  management of comorbidities.    MRSE bacteremia, presumed endocarditis due to inabiltiy to tolerate TEE ID following, appreciate recommendations. Afebrile without leukocytosis. Repeat [facility] 5/17 NGTD. Continue Vanc with HD with plan for 6 week course EOT 6/26. Plan for line holiday after HD tomorrow.     Acute on chronic hypoxemic and hypercapnic respiratory failure, OHS/OSA Acutely Decompensated HFrEF EF 20% ESRD with volume overload Hyponatremia Hyperkalemia Incidental covid positive Atrial Fibrillation Continue inpatient HD MWF. Appreciate Nephrology recommendations. K+ mildly elevated at 5.4. -Continue Digoxin , Eliquis , Midodrine  -Airborne precautions -Follow-up DG chest    Chronic Pain -Continue Lyrica  and Effexor  -Oxycodone  prn -Flexeril  prn   Hypothyroidism -Continue home Synthroid    Goals of Care I had an extensive conversation with him 5/17 regarding his overall health and how non-compliance is affecting his trajectory.  I feel this was a fruitful conversation.  He states he will be "all in" on medical recommendations.    Ronni Colace, DO 12/11/2023, 9:25 AM  Pager: [phone] After 5pm or weekend: [phone]

## 2023-12-11 NOTE — Progress Notes (Signed)
 Patient not taking any of his medications. Notified that I am not able to leave meds in room unattended. Patient not taking medications.

## 2023-12-11 NOTE — Progress Notes (Signed)
 Patient only received 3 salad dressing packages with meal, requested 7. Notified at time of order that he is not allowed 7 dressing pkgs due to renal diet. Refusing breakfast because of it and requesting salad to be tossed into the trash. Now also refusing laxatives and additional protein supplement. Upset.

## 2023-12-11 NOTE — Progress Notes (Incomplete)
 1215 Alert oriented and refusing movement in bed to change fitted sheet that has blood stains from excoriations on right back from "back scratcher". Pt states "later" when asked if he would like sheet changed.  Pt refusing renal diet lunch tray. States "I can't eat hospital food" States he "didn't order this tray and I can't eat that".   Pt refusing miralax , docusate, and senna. Acknowledges last BM was 6 days ago (Monday 12/05/2003) and implies that he goes 1-2 weeks between BMs. Pt states that the longest he has gone was 17 days between BMs.   Pt refused AM midodrine . But took noon dose for this RN.Pt agreed to take AM doses of apixaban , Neurontin , and Effexor  that he refused this AM.

## 2023-12-11 NOTE — Plan of Care (Signed)
  Problem: Education: Goal: Ability to describe self-care measures that may prevent or decrease complications (Diabetes Survival Skills Education) will improve Outcome: Progressing   Problem: Coping: Goal: Ability to adjust to condition or change in health will improve Outcome: Progressing   Problem: Health Behavior/Discharge Planning: Goal: Ability to identify and utilize available resources and services will improve Outcome: Progressing   Problem: Metabolic: Goal: Ability to maintain appropriate glucose levels will improve Outcome: Progressing   Problem: Nutritional: Goal: Maintenance of adequate nutrition will improve Outcome: Progressing Goal: Progress toward achieving an optimal weight will improve Outcome: Progressing   Problem: Skin Integrity: Goal: Risk for impaired skin integrity will decrease Outcome: Progressing   Problem: Tissue Perfusion: Goal: Adequacy of tissue perfusion will improve Outcome: Progressing   Problem: Clinical Measurements: Goal: Will remain free from infection Outcome: Progressing Goal: Diagnostic test results will improve Outcome: Progressing Goal: Respiratory complications will improve Outcome: Progressing Goal: Cardiovascular complication will be avoided Outcome: Progressing   Problem: Activity: Goal: Risk for activity intolerance will decrease Outcome: Progressing   Problem: Coping: Goal: Level of anxiety will decrease Outcome: Progressing   Problem: Elimination: Goal: Will not experience complications related to urinary retention Outcome: Progressing   Problem: Pain Managment: Goal: General experience of comfort will improve and/or be controlled Outcome: Progressing   Problem: Skin Integrity: Goal: Risk for impaired skin integrity will decrease Outcome: Progressing   Problem: Education: Goal: Knowledge of risk factors and measures for prevention of condition will improve Outcome: Progressing   Problem: Coping: Goal:  Psychosocial and spiritual needs will be supported Outcome: Progressing   Problem: Respiratory: Goal: Will maintain a patent airway Outcome: Progressing Goal: Complications related to the disease process, condition or treatment will be avoided or minimized Outcome: Progressing

## 2023-12-11 NOTE — Progress Notes (Signed)
 [facility] Progress Note  Subjective:  Seen in room  Wants dialysis today, I told him Sunday dialysis is for emergencies And he is not an emergency Had long discussion about fluid balance and what causes edema in HD pts   Vitals:   12/11/23 0335 12/11/23 0400 12/11/23 0500 12/11/23 0800  BP:  (!) 84/68  105/72  Pulse:  (!) 108  (!) 128  Resp:  14  18  Temp: 97.9 F (36.6 C)     TempSrc: Oral     SpO2:  93%  97%  Weight:   (!) 183 kg   Height:        Exam: Gen alert, chronically ill appearing nasal O2 not in distress Chest clear bilat to bases RRR no RG Abd soft morbidly obese ntnd no mass or ascites +bs Ext diffuse 2+ bilateral pitting edema Neuro is alert, Ox 3 , nf, deconditioned     TDC intact       Renal-related home meds: Sensipar  30 mg MWF Midodrine  30 mg 3 times daily Renvela  2 AC 3 times daily      OP HD: MWF [facility] 4.5h  B400   180kg   TDC   NO heparin  (HIT)       Assessment/ Plan: MRSE bacteremia: will need a line holiday. Will consult IR for cath removal after HD Monday.  Acute on chronic hypoxic / hypercarbic resp failure. Admit CXR w/ some IS pulm edema. Started CRRT 5/13, but CRRT was not efficient so he was transitioned back to iHD which was done 5/16 w/ 5 L off.   Chronic vol overload ESRD: on HD MWF. CRRT 5/13- 5/15, now back on iHD. Next HD Monday.  Chronic hypotension: takes 30mg  tid of midodrine  at home, cont here Anemia of esrd: Hb 10, follow Secondary hyperparathyroidism: CCa in range, follow, cont binders w/ meals. Severe bivent CHF: last echo 12/06/23 w/ EF < 20% and RV function severely reduced Atrial fib: another factor that can affect quality of dialysis for this pt      Larry Poag MD  [facility] 12/11/2023, 10:26 AM  Recent Labs  Lab 12/10/23 0304 12/10/23 1615 12/11/23 0249  HGB 10.6*  --  10.3*  ALBUMIN  2.8* 2.8* 2.8*  CALCIUM  9.4 9.6 9.6  PHOS 4.7* 5.4* 5.4*  CREATININE 5.28* 5.93* 6.32*  K 4.7 4.9 5.4*   No  results for input(s): "IRON", "TIBC", "FERRITIN" in the last 168 hours. Inpatient medications:  (feeding supplement) PROSource Plus  30 mL Oral TID BM   apixaban   5 mg Oral BID   Chlorhexidine  Gluconate Cloth  6 each Topical Q0600   digoxin   0.125 mg Oral Once per day on Monday Wednesday Friday   feeding supplement  237 mL Oral BID BM   fentaNYL   1 patch Transdermal Q72H   levothyroxine   25 mcg Oral Q0600   midodrine   30 mg Oral TID WC   multivitamin  1 tablet Oral QHS   polyethylene glycol  17 g Oral Daily   pregabalin   50 mg Oral Daily   senna  1 tablet Oral Daily   venlafaxine  XR  37.5 mg Oral Daily    anticoagulant sodium citrate      vancomycin  Stopped (12/09/23 1443)   alteplase , anticoagulant sodium citrate , cyclobenzaprine , docusate sodium , feeding supplement (NEPRO CARB STEADY), lidocaine  (PF), lidocaine -prilocaine , ondansetron  (ZOFRAN ) IV, mouth rinse, oxyCODONE , pentafluoroprop-tetrafluoroeth

## 2023-12-12 ENCOUNTER — Encounter: Admitting: Student

## 2023-12-12 DIAGNOSIS — R579 Shock, unspecified: Secondary | ICD-10-CM | POA: Diagnosis not present

## 2023-12-12 DIAGNOSIS — N186 End stage renal disease: Secondary | ICD-10-CM | POA: Diagnosis not present

## 2023-12-12 DIAGNOSIS — A411 Sepsis due to other specified staphylococcus: Secondary | ICD-10-CM | POA: Diagnosis not present

## 2023-12-12 DIAGNOSIS — N049 Nephrotic syndrome with unspecified morphologic changes: Secondary | ICD-10-CM | POA: Diagnosis not present

## 2023-12-12 LAB — RENAL FUNCTION PANEL
Albumin: 3 g/dL — ABNORMAL LOW (ref 3.5–5.0)
Anion gap: 15 (ref 5–15)
BUN: 49 mg/dL — ABNORMAL HIGH (ref 6–20)
CO2: 21 mmol/L — ABNORMAL LOW (ref 22–32)
Calcium: 9.7 mg/dL (ref 8.9–10.3)
Chloride: 92 mmol/L — ABNORMAL LOW (ref 98–111)
Creatinine, Ser: 7.61 mg/dL — ABNORMAL HIGH (ref 0.61–1.24)
GFR, Estimated: 9 mL/min — ABNORMAL LOW (ref >60)
Glucose, Bld: 84 mg/dL (ref 70–99)
Phosphorus: 6.6 mg/dL — ABNORMAL HIGH (ref 2.5–4.6)
Potassium: 6.2 mmol/L — ABNORMAL HIGH (ref 3.5–5.1)
Sodium: 128 mmol/L — ABNORMAL LOW (ref 135–145)

## 2023-12-12 LAB — VANCOMYCIN, RANDOM: Vancomycin Rm: 29 ug/mL

## 2023-12-12 LAB — GLUCOSE, CAPILLARY
Glucose-Capillary: 104 mg/dL — ABNORMAL HIGH (ref 70–99)
Glucose-Capillary: 74 mg/dL (ref 70–99)
Glucose-Capillary: 88 mg/dL (ref 70–99)
Glucose-Capillary: 98 mg/dL (ref 70–99)

## 2023-12-12 LAB — CBC
HCT: 35.5 % — ABNORMAL LOW (ref 39.0–52.0)
Hemoglobin: 11.2 g/dL — ABNORMAL LOW (ref 13.0–17.0)
MCH: 31 pg (ref 26.0–34.0)
MCHC: 31.5 g/dL (ref 30.0–36.0)
MCV: 98.3 fL (ref 80.0–100.0)
Platelets: 247 10*3/uL (ref 150–400)
RBC: 3.61 MIL/uL — ABNORMAL LOW (ref 4.22–5.81)
RDW: 17 % — ABNORMAL HIGH (ref 11.5–15.5)
WBC: 7.9 10*3/uL (ref 4.0–10.5)
nRBC: 0.3 % — ABNORMAL HIGH (ref 0.0–0.2)

## 2023-12-12 LAB — MAGNESIUM: Magnesium: 2.6 mg/dL — ABNORMAL HIGH (ref 1.7–2.4)

## 2023-12-12 MED ORDER — LIDOCAINE HCL (PF) 1 % IJ SOLN: 5.0000 mL | INTRAMUSCULAR | Status: DC | PRN

## 2023-12-12 MED ORDER — NEPRO/CARBSTEADY PO LIQD: 237.0000 mL | ORAL | Status: DC | PRN

## 2023-12-12 MED ORDER — SEVELAMER CARBONATE 800 MG PO TABS
1600.0000 mg | ORAL_TABLET | Freq: Three times a day (TID) | ORAL | Status: DC
  Administered 2023-12-12: 1600 mg via ORAL
  Filled 2023-12-12 (×8): qty 2

## 2023-12-12 MED ORDER — LIDOCAINE-PRILOCAINE 2.5-2.5 % EX CREA: 1.0000 | TOPICAL_CREAM | CUTANEOUS | Status: DC | PRN

## 2023-12-12 MED ORDER — SODIUM ZIRCONIUM CYCLOSILICATE 10 G PO PACK
10.0000 g | PACK | Freq: Every day | ORAL | Status: DC
  Filled 2023-12-12 (×3): qty 1

## 2023-12-12 MED ORDER — ANTICOAGULANT SODIUM CITRATE 4% (200MG/5ML) IV SOLN
5.0000 mL | Status: DC | PRN
  Administered 2023-12-12 – 2023-12-13 (×2): 5 mL
  Filled 2023-12-12 (×5): qty 5

## 2023-12-12 MED ORDER — SODIUM ZIRCONIUM CYCLOSILICATE 10 G PO PACK: 10.0000 g | PACK | Freq: Once | ORAL | Status: DC

## 2023-12-12 MED ORDER — PENTAFLUOROPROP-TETRAFLUOROETH EX AERO: 1.0000 | INHALATION_SPRAY | CUTANEOUS | Status: DC | PRN

## 2023-12-12 NOTE — TOC Progression Note (Signed)
 Transition of Care [facility]) - Progression Note    Patient Details  Name: Phillip Budhu MRN: [id number] Date of Birth: 1986-04-23  [name] CM/SW Contact  Tom-Johnson, Angelique Ken, RN Phone Number: 12/12/2023, 1:51 PM  Clinical Narrative:     Patient continue on IV abx with iHD with plan for 6 weeks course EOT 01/19/24. Plan is for [facility] after HD today with plan to replace 12/14/23. Patient currently is on 12L O2 [place].   Patient not Medically ready for discharge.  CM will continue to follow as patient progresses with care towards discharge.            Expected Discharge Plan and Services                                               Social Determinants of Health (SDOH) Interventions SDOH Screenings   Food Insecurity: No Food Insecurity (12/06/2023)  Recent Concern: Food Insecurity - Food Insecurity Present (11/01/2023)  Housing: High Risk (12/06/2023)  Transportation Needs: Unmet Transportation Needs (12/06/2023)  Utilities: At Risk (12/06/2023)  Alcohol  Screen: Low Risk  (10/06/2023)  Depression (PHQ2-9): High Risk (10/06/2023)  Financial Resource Strain: Low Risk  (10/06/2023)  Physical Activity: Insufficiently Active (10/06/2023)  Social Connections: Socially Isolated (10/06/2023)  Stress: No Stress Concern Present (10/06/2023)  Tobacco Use: Medium Risk (12/06/2023)  Health Literacy: Adequate Health Literacy (10/06/2023)    Readmission Risk Interventions    10/13/2023   11:27 AM 09/23/2023    5:05 PM 07/05/2023    4:25 PM  Readmission Risk Prevention Plan  Transportation Screening Complete Complete Complete  HRI or Home Care Consult   Complete  Social Work Consult for Recovery Care Planning/Counseling   Complete  Palliative Care Screening   Not Applicable  Medication Review Oceanographer) Referral to Pharmacy Referral to Pharmacy Complete  PCP or Specialist appointment within 3-5 days of discharge  Complete   HRI or Home Care Consult  Complete Complete   SW Recovery Care/Counseling Consult Complete Complete   Palliative Care Screening Not Applicable Not Applicable   Skilled Nursing Facility Not Applicable Not Applicable

## 2023-12-12 NOTE — Progress Notes (Signed)
   12/12/23 2325  BiPAP/CPAP/SIPAP  Reason BIPAP/CPAP not in use Non-compliant (pt refused at this time.)   Pt in no distress at this time on 10L [place] HFNC. RT will continue to monitor and be available as needed.

## 2023-12-12 NOTE — Progress Notes (Signed)
 Patient yelling and cussing and throwing food. Yelling for me to get out of his room. Refusing 1200 medications and blood sugar checks.

## 2023-12-12 NOTE — Progress Notes (Signed)
 Patient stated he did not like the "water " hooked to his oxygen . Educated patient on humidified air vs non-humidified air. Patient still insisted he wanted the nasal cannula hooked directly to oxygen  without it being humidified. Also requested oxygen  be turned up more despite oxygen  saturation with in normal limits.

## 2023-12-12 NOTE — Progress Notes (Addendum)
 Patient refusing several medications including lokelma . Education provided his potassium is very high and needs lokelma  and dialysis to reduce elevation to reduce chance of lethal heart arrhythmia. Education provided due to illness and condition of heart he may not survive CPR attempts if heart has lethal arrhythmia. Patient stated I don't care if I die I'm not drinking it. Dr Broadus Canes notified. He also refuses miralax  because it "taste bad" offered to mix with juice and he continued to refuse. Education provided he has not had a bowel movement since 12/06/23 and this puts his at risk for bowel complications and a full belly may contribute to his increased work of breathing. Patient refused turns. Education provided I need to evaluate his back wounds, continued to refuse. Patient seen scratching his groin and scrotum with a wooden back scratcher. Education provided not to scratch areas he can not see because he could cause a wound and skin breakdown can lead to infection. Lotion refused. Patient seen inserting metal tweezers in nostril several inches. He states he was picking his nose. Education provided on infection risk and wound risk. Tweezers thrown into sharps container. Patient states he also refuses to have his dialysis catheter replaced. Education provided why this needs to be replaced. I told the patient he will need to discuss this treatment with the doctor during morning rounds.

## 2023-12-12 NOTE — Progress Notes (Signed)
   12/12/23 0310  BiPAP/CPAP/SIPAP  $ Non-Invasive Home Ventilator  Subsequent  BiPAP/CPAP/SIPAP Pt Type Adult  BiPAP/CPAP/SIPAP  (pt request not to be placed on for the night)  BiPAP/CPAP /SiPAP Vitals  Pulse Rate (!) 111  Resp 20  Bilateral Breath Sounds Diminished  MEWS Score/Color  MEWS Score 3  MEWS Score Color Yellow

## 2023-12-12 NOTE — Progress Notes (Addendum)
 Pharmacy Antibiotic Note  Phillip Young is a 38 y.o. male admitted on 12/06/2023 with enterococcal/MRSE complicated bacteremia concern for endocarditis .  Pharmacy has been consulted for vancomycin  dosing.  Vancomycin  level on 12/12/23 @ 0505 reported as 29 micrograms/mL.   Plan: This patient's current antibiotics will be continued without adjustments. Vancomycin , 1 gram, IV with HD on MWF. Height: 6' (182.9 cm) Weight: (!) 176.3 kg (388 lb 10.7 oz) IBW/kg (Calculated) : 77.6  Temp (24hrs), Avg:97 F (36.1 C), Min:96.5 F (35.8 C), Max:97.4 F (36.3 C)  Recent Labs  Lab 12/06/23 0216 12/06/23 0232 12/06/23 0328 12/06/23 0430 12/06/23 1541 12/07/23 0551 12/07/23 0552 12/10/23 0304 12/10/23 1615 12/11/23 0249 12/11/23 1528 12/12/23 0458 12/12/23 0505  WBC 11.9*  --   --   --   --  12.5*  --  8.1  --  8.8  --  7.9  --   CREATININE 10.83*  --   --   --    < > 7.49*   < > 5.28* 5.93* 6.32* 6.91*  --  7.61*  LATICACIDVEN  --  4.8* 7.9* >9.0*  --   --   --   --   --   --   --   --   --   VANCORANDOM  --   --   --   --   --   --   --   --   --   --   --   --  29   < > = values in this interval not displayed.    Estimated Creatinine Clearance: 21.8 mL/min (A) (by C-G formula based on SCr of 7.61 mg/dL (H)).    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  Kenda Paula, PharmD, CPP Clinical Pharmacist Practitioner 12/12/2023 7:14 AM

## 2023-12-12 NOTE — Progress Notes (Signed)
 Received patient in bed to unit. 1O10 Alert and oriented. x4 Informed consent signed and in chart. Yes  TX duration: 3.5  Patient tolerated well.  Transported pt to the room  Alert, without acute distress.  Hand-off given to patient's nurse.  Jenette Mitchell RN  Access used: Right CVC Access issues: None  Total UF removed: 6000 Medication(s) given: None Post HD weight: 176.0kg Post HD VS: 98.7 Temp,21 Resp,99% on RA,84 Liters Removed,,111/ 64   Keesha Pellum S Rudi Bunyard RN, DNP [facility]

## 2023-12-12 NOTE — Progress Notes (Signed)
 [facility] Progress Note  Subjective:  Last HD on 5/16 with 5 kg removed (located in chart note).  He states that he is not having outside food.  We discussed fluid restrictions and plans for HD today.      Review of systems:  Reports shortness of breath  States he feels like he has extra fluid on  Denies n/v    Vitals:   12/12/23 0311 12/12/23 0400 12/12/23 0440 12/12/23 0452  BP:  95/84    Pulse:  (!) 118    Resp: (!) 21 18  18   Temp:   (!) 97.4 F (36.3 C)   TempSrc:   Oral   SpO2:  92%  96%  Weight:      Height:        Physical Exam:  General adult male in bed in no acute distress HEENT normocephalic atraumatic extraocular movements intact sclera anicteric Neck supple trachea midline Lungs reduced breath sounds; on 10 liters oxygen  per nasal cannula  Heart tachycardic; S1S2 Abdomen obese habitus; soft nontender nondistended Extremities 1-2+ edema bilateral lower extremities  Psych normal mood and affect Neuro alert and oriented x 3 provides hx and follows commands  Access RIJ tunneled catheter in place         Renal-related home meds: Sensipar  30 mg MWF Midodrine  30 mg 3 times daily Renvela  2 AC 3 times daily      OP HD: MWF [facility] 4.5h  B400   180kg   TDC   NO heparin  (HIT)       Assessment/ Plan: MRSE bacteremia: will need a line holiday.  IR was  consulted for cath removal after HD Monday, 5/19.  Anticipate placing tunn catheter back on 5/21.  Acute on chronic hypoxic / hypercarbic resp failure. Admit CXR w/ some IS pulm edema. Started CRRT 5/13, but CRRT was not efficient per charting so he was transitioned back to iHD on 5/16.   Optimize volume with HD.   Covid 19 PNA - therapies - per primary team and Optimize volume status with HD Chronic vol overload - optimize volume status with HD and fluid restrictions with patient is key, as well.  Discussed with patient  ESRD:  on HD per MWF schedule Getting line holiday Chronic hypotension:  takes 30mg  tid of midodrine  at home, cont here Hyperkalemia - lokelma  once daily for now.  HD today.  Low K diet restriction  Anemia of esrd: Hb acceptable; defer ESA for now Secondary hyperparathyroidism: hyperphos on renvela  at home - resumed Severe bivent CHF: last echo 12/06/23 w/ EF < 20% and RV function severely reduced Atrial fibrillation - agents per primary team   Disposition - RN states orders for transfer to progressive bed are in place   Recent Labs  Lab 12/11/23 0249 12/11/23 1528 12/12/23 0458 12/12/23 0505  HGB 10.3*  --  11.2*  --   ALBUMIN  2.8* 2.8*  --  3.0*  CALCIUM  9.6 9.6  --  9.7  PHOS 5.4* 6.0*  --  6.6*  CREATININE 6.32* 6.91*  --  7.61*  K 5.4* 5.4*  --  6.2*   No results for input(s): "IRON", "TIBC", "FERRITIN" in the last 168 hours. Inpatient medications:  (feeding supplement) PROSource Plus  30 mL Oral TID BM   apixaban   5 mg Oral BID   Chlorhexidine  Gluconate Cloth  6 each Topical Q0600   Chlorhexidine  Gluconate Cloth  6 each Topical Q0600   digoxin   0.125 mg Oral Once per day  on Monday Wednesday Friday   feeding supplement  237 mL Oral BID BM   fentaNYL   1 patch Transdermal Q72H   levothyroxine   25 mcg Oral Q0600   midodrine   30 mg Oral TID WC   multivitamin  1 tablet Oral QHS   polyethylene glycol  17 g Oral Daily   pregabalin   50 mg Oral Daily   senna  1 tablet Oral Daily   venlafaxine  XR  37.5 mg Oral Daily    anticoagulant sodium citrate      vancomycin  Stopped (12/09/23 1443)   alteplase , anticoagulant sodium citrate , cyclobenzaprine , docusate sodium , feeding supplement (NEPRO CARB STEADY), lidocaine  (PF), lidocaine -prilocaine , ondansetron  (ZOFRAN ) IV, mouth rinse, oxyCODONE , pentafluoroprop-tetrafluoroeth     Christyann Manolis C Hero Kulish, MD 6:23 AM 12/12/2023

## 2023-12-12 NOTE — Progress Notes (Signed)
 PT Cancellation Note  Patient Details Name: Phillip Young MRN: [id number] DOB: 03/05/1986   Cancelled Treatment:    Reason Eval/Treat Not Completed: Patient at procedure or test/unavailable (on HD)   Jerricka Carvey B Yerick Eggebrecht 12/12/2023, 7:49 AM Annis Baseman, PT Acute Rehabilitation Services Office: [phone]

## 2023-12-12 NOTE — Progress Notes (Signed)
 Pt off bipap when RT came to assess.  Pt in no distress requiring bipap at this time.

## 2023-12-12 NOTE — Progress Notes (Signed)
                 Subjective Denies chest pain/palpitations/SOB. Didn't want Lokelma  because it taste bad in water . Doesn't want bowel regimen.  Physical exam Blood pressure 110/69, pulse (!) 110, temperature 97.6 F (36.4 C), temperature source Oral, resp. rate (!) 21, height 6' (1.829 m), weight (!) 182 kg, SpO2 96%.  Constitutional: morbidly obese, chronically ill-appearing, NAD Cardiovascular: irregular rate and rhythm, anasarca Pulmonary/Chest: normal work of breathing on 10 L HFNC, coarse sounds on anterior auscultation Abdominal: normoactive bowel sounds, soft, non-tender, non-distended Neurological: alert & oriented Skin: HD cath without surrounding signs of infection  Weight change: -6.7 kg   Intake/Output Summary (Last 24 hours) at 12/12/2023 1159 Last data filed at 12/12/2023 0827 Gross per 24 hour  Intake 870 ml  Output --  Net 870 ml   Net IO Since Admission: 2,249.47 mL [12/12/23 1159]  Labs, images, and other studies    Latest Ref Rng & Units 12/12/2023    4:58 AM 12/11/2023    2:49 AM 12/10/2023    3:04 AM  CBC  WBC 4.0 - 10.5 K/uL 7.9  8.8  8.1   Hemoglobin 13.0 - 17.0 g/dL 59.5  63.8  75.6   Hematocrit 39.0 - 52.0 % 35.5  33.1  34.0   Platelets 150 - 400 K/uL 247  220  230        Latest Ref Rng & Units 12/12/2023    5:05 AM 12/11/2023    3:28 PM 12/11/2023    2:49 AM  BMP  Glucose 70 - 99 mg/dL 84  94  87   BUN 6 - 20 mg/dL 49  44  37   Creatinine 0.61 - 1.24 mg/dL 4.33  2.95  1.88   Sodium 135 - 145 mmol/L 128  126  129   Potassium 3.5 - 5.1 mmol/L 6.2  5.4  5.4   Chloride 98 - 111 mmol/L 92  91  92   CO2 22 - 32 mmol/L 21  18  21    Calcium  8.9 - 10.3 mg/dL 9.7  9.6  9.6      Assessment and plan Hospital day 6  Emerick Weatherly is a 38 y.o.male with PMH significant biventricular HF (EF < 20%), chronic Afib on Eliquis , chronic hypotension on midodrine  30mg  TID, ESRD on MWF iHD, chronic hypoxic/ hypercarbic respiratory failure on 2-4L [place] and NIV,  severe morbid obesity, and chronic pain admitted for MRSE bacteremia and management of comorbidities.    MRSE bacteremia, presumed endocarditis due to inabiltiy to tolerate TEE ID following, appreciate recommendations. Afebrile without leukocytosis. Repeat [facility] 5/17 NGTD. Continue Vanc with HD with plan for 6 week course EOT 6/26. Plan for line holiday after HD today with plan to replace 5/21. -Trend Vitals/Labs   Acute on chronic hypoxemic and hypercapnic respiratory failure, OHS/OSA Acutely Decompensated HFrEF EF 20% ESRD with volume overload Hyponatremia Hyperkalemia Incidental covid positive Atrial Fibrillation Continue inpatient HD MWF. Appreciate Nephrology recommendations. Pre-HD K+ 6.2 today. No chest pain/palpitations.  -Continue Digoxin , Eliquis , Midodrine  -Airborne precautions  Constipation No BM since 5/13. Refusing bowel regimen. States he went 17 days without BM recently.   Chronic Pain -Continue Lyrica  and Effexor  -Oxycodone  prn -Flexeril  prn   Hypothyroidism -Continue home Synthroid    Ronni Colace, DO 12/12/2023, 11:59 AM  Pager: [phone] After 5pm or weekend: [phone]

## 2023-12-13 ENCOUNTER — Encounter

## 2023-12-13 ENCOUNTER — Inpatient Hospital Stay (HOSPITAL_COMMUNITY)

## 2023-12-13 DIAGNOSIS — N049 Nephrotic syndrome with unspecified morphologic changes: Secondary | ICD-10-CM | POA: Diagnosis not present

## 2023-12-13 DIAGNOSIS — A411 Sepsis due to other specified staphylococcus: Secondary | ICD-10-CM | POA: Diagnosis not present

## 2023-12-13 DIAGNOSIS — N186 End stage renal disease: Secondary | ICD-10-CM | POA: Diagnosis not present

## 2023-12-13 DIAGNOSIS — R579 Shock, unspecified: Secondary | ICD-10-CM | POA: Diagnosis not present

## 2023-12-13 HISTORY — PX: IR REMOVAL TUN CV CATH W/O FL: IMG2289

## 2023-12-13 LAB — RENAL FUNCTION PANEL
Albumin: 2.8 g/dL — ABNORMAL LOW (ref 3.5–5.0)
Anion gap: 13 (ref 5–15)
BUN: 40 mg/dL — ABNORMAL HIGH (ref 6–20)
CO2: 24 mmol/L (ref 22–32)
Calcium: 9.4 mg/dL (ref 8.9–10.3)
Chloride: 91 mmol/L — ABNORMAL LOW (ref 98–111)
Creatinine, Ser: 6.54 mg/dL — ABNORMAL HIGH (ref 0.61–1.24)
GFR, Estimated: 10 mL/min — ABNORMAL LOW (ref >60)
Glucose, Bld: 102 mg/dL — ABNORMAL HIGH (ref 70–99)
Phosphorus: 5.6 mg/dL — ABNORMAL HIGH (ref 2.5–4.6)
Potassium: 4.9 mmol/L (ref 3.5–5.1)
Sodium: 128 mmol/L — ABNORMAL LOW (ref 135–145)

## 2023-12-13 LAB — GLUCOSE, CAPILLARY
Glucose-Capillary: 81 mg/dL (ref 70–99)
Glucose-Capillary: 98 mg/dL (ref 70–99)
Glucose-Capillary: 83 mg/dL (ref 70–99)
Glucose-Capillary: 94 mg/dL (ref 70–99)
Glucose-Capillary: 108 mg/dL — ABNORMAL HIGH (ref 70–99)
Glucose-Capillary: 113 mg/dL — ABNORMAL HIGH (ref 70–99)

## 2023-12-13 LAB — CBC
HCT: 34.8 % — ABNORMAL LOW (ref 39.0–52.0)
Hemoglobin: 10.9 g/dL — ABNORMAL LOW (ref 13.0–17.0)
MCH: 31.3 pg (ref 26.0–34.0)
MCHC: 31.3 g/dL (ref 30.0–36.0)
MCV: 100 fL (ref 80.0–100.0)
Platelets: 244 10*3/uL (ref 150–400)
RBC: 3.48 MIL/uL — ABNORMAL LOW (ref 4.22–5.81)
RDW: 17.2 % — ABNORMAL HIGH (ref 11.5–15.5)
WBC: 8.6 10*3/uL (ref 4.0–10.5)
nRBC: 0.2 % (ref 0.0–0.2)

## 2023-12-13 LAB — MAGNESIUM: Magnesium: 2.4 mg/dL (ref 1.7–2.4)

## 2023-12-13 MED ORDER — VANCOMYCIN HCL IN DEXTROSE 1-5 GM/200ML-% IV SOLN
1000.0000 mg | INTRAVENOUS | Status: AC
  Administered 2023-12-13: 1000 mg via INTRAVENOUS
  Filled 2023-12-13: qty 200

## 2023-12-13 MED ORDER — LIDOCAINE-PRILOCAINE 2.5-2.5 % EX CREA: 1.0000 | TOPICAL_CREAM | CUTANEOUS | Status: DC | PRN

## 2023-12-13 MED ORDER — LIDOCAINE HCL 1 % IJ SOLN
20.0000 mL | Freq: Once | INTRAMUSCULAR | Status: AC
  Administered 2023-12-13: 10 mL via INTRADERMAL
  Filled 2023-12-13: qty 20

## 2023-12-13 MED ORDER — OXYCODONE HCL 5 MG PO TABS
10.0000 mg | ORAL_TABLET | Freq: Four times a day (QID) | ORAL | Status: DC | PRN
  Administered 2023-12-13 – 2023-12-15 (×5): 10 mg via ORAL
  Filled 2023-12-13 (×5): qty 2

## 2023-12-13 MED ORDER — PENTAFLUOROPROP-TETRAFLUOROETH EX AERO: 1.0000 | INHALATION_SPRAY | CUTANEOUS | Status: DC | PRN

## 2023-12-13 MED ORDER — ANTICOAGULANT SODIUM CITRATE 4% (200MG/5ML) IV SOLN
5.0000 mL | Status: DC | PRN
  Administered 2023-12-13: 5 mL
  Filled 2023-12-13: qty 5

## 2023-12-13 MED ORDER — LIDOCAINE HCL (PF) 1 % IJ SOLN: 5.0000 mL | INTRAMUSCULAR | Status: DC | PRN

## 2023-12-13 MED ORDER — LIDOCAINE HCL 1 % IJ SOLN
INTRAMUSCULAR | Status: AC
  Filled 2023-12-13: qty 20

## 2023-12-13 MED ORDER — CHLORHEXIDINE GLUCONATE CLOTH 2 % EX PADS
6.0000 | MEDICATED_PAD | Freq: Every day | CUTANEOUS | Status: DC
  Administered 2023-12-13: 6 via TOPICAL

## 2023-12-13 NOTE — Progress Notes (Signed)
 eLink Physician-Brief Progress Note Patient Name: Phillip Young DOB: 1985-08-15 MRN: [id number]   Date of Service  12/13/2023  HPI/Events of Note  38 yo man admitted 5/13 with hypotension and shock. COVID positive with respiratory issues as well. PMH: ESRD on HD, biventricular heart failure EF<20%, morbid obesity, chronic respiratory failure on 4LNC during the day   Complaining of pain rated 9 out of 10 in the feet and legs patient has chronic pain to the legs and feet.  Patient normally takes Lyrica , fentanyl  patch, and oxycodone   eICU Interventions  Per primary team's note, maintain Lyrica  and oxycodone  as needed.  Reordered oxycodone      Intervention Category Intermediate Interventions: Pain - evaluation and management  Satcha Storlie 12/13/2023, 11:41 PM

## 2023-12-13 NOTE — Plan of Care (Signed)
  Problem: Education: Goal: Ability to describe self-care measures that may prevent or decrease complications (Diabetes Survival Skills Education) will improve Outcome: Progressing Goal: Individualized Educational Video(s) Outcome: Progressing   Problem: Coping: Goal: Ability to adjust to condition or change in health will improve Outcome: Progressing   Problem: Fluid Volume: Goal: Ability to maintain a balanced intake and output will improve Outcome: Progressing   Problem: Health Behavior/Discharge Planning: Goal: Ability to identify and utilize available resources and services will improve Outcome: Progressing Goal: Ability to manage health-related needs will improve Outcome: Progressing   Problem: Metabolic: Goal: Ability to maintain appropriate glucose levels will improve Outcome: Progressing   Problem: Nutritional: Goal: Maintenance of adequate nutrition will improve Outcome: Progressing Goal: Progress toward achieving an optimal weight will improve Outcome: Progressing   Problem: Skin Integrity: Goal: Risk for impaired skin integrity will decrease Outcome: Progressing   Problem: Tissue Perfusion: Goal: Adequacy of tissue perfusion will improve Outcome: Progressing   Problem: Education: Goal: Knowledge of General Education information will improve Description: Including pain rating scale, medication(s)/side effects and non-pharmacologic comfort measures Outcome: Progressing   Problem: Health Behavior/Discharge Planning: Goal: Ability to manage health-related needs will improve Outcome: Progressing   Problem: Clinical Measurements: Goal: Ability to maintain clinical measurements within normal limits will improve Outcome: Progressing Goal: Will remain free from infection Outcome: Progressing Goal: Diagnostic test results will improve Outcome: Progressing Goal: Respiratory complications will improve Outcome: Progressing Goal: Cardiovascular complication will  be avoided Outcome: Progressing   Problem: Activity: Goal: Risk for activity intolerance will decrease Outcome: Progressing   Problem: Nutrition: Goal: Adequate nutrition will be maintained Outcome: Progressing   Problem: Coping: Goal: Level of anxiety will decrease Outcome: Progressing   

## 2023-12-13 NOTE — Progress Notes (Signed)
   12/13/23 1250  Vitals  Temp 98.3 F (36.8 C)  Pulse Rate (!) 119  Resp 16  BP 127/88  SpO2 98 %  O2 Device HHFNC  Type of Weight Post-Dialysis (bed is not weighing post tx)  Oxygen  Therapy  FiO2 (%) 40 %  Patient Activity (if Appropriate) In bed  Pulse Oximetry Type Continuous  Oximetry Probe Site Changed No  Post Treatment  Dialyzer Clearance Lightly streaked  Hemodialysis Intake (mL) 0 mL  Liters Processed 84  Fluid Removed (mL) 5500 mL  Tolerated HD Treatment Yes  Post-Hemodialysis Comments volume---200cc was removed for the vancomycin    Tx completed at pt bedside---100m08 Alert and oriented.  Informed consent signed and in chart.   TX duration:3.5  Patient tolerated well.  Alert, without acute distress.  Hand-off given to patient's nurse.   Access used: RIJDLC----to be removed by IR today Access issues: no complications  Total UF removed: 5700---200 of which was the volume for the iv vancomycin --total of 5500 of uf Medication(s) given: vancomycin  iv x 1   [facility]

## 2023-12-13 NOTE — Progress Notes (Signed)
 [facility] Progress Note  Subjective:  Last HD on 5/19 with 6 kg removed (located in chart note).  IR did not remove his tunneled catheter yet.  Per charting, yelling and cursing at staff yesterday at one point.  His nurse states that he refused to have the tunneled catheter taken out yesterday because he felt like he needed more fluid off; he confirms this.  He does agree to wear a mask today.  He has been on 10 liters HFNC and asks me to increase to 12.   Review of systems:    Reports shortness of breath but better States he feels like he has extra fluid on - less so than before No chest pain Leg discomfort  Denies n/v    Vitals:   12/13/23 0000 12/13/23 0016 12/13/23 0400 12/13/23 0442  BP: (!) 106/90  101/74   Pulse: (!) 138 (!) 106  (!) 117  Resp: 20 20 19  (!) 24  Temp:  98.2 F (36.8 C)  97.8 F (36.6 C)  TempSrc:  Oral  Oral  SpO2: 98% 98% 100% 100%  Weight:      Height:        Physical Exam:    General adult male in bed in no acute distress HEENT normocephalic atraumatic extraocular movements intact sclera anicteric Neck supple trachea midline Lungs reduced breath sounds; on 10 liters oxygen  per nasal cannula  Heart tachycardic; S1S2 Abdomen obese habitus; soft nontender nondistended Extremities 1+ edema bilateral lower extremities  Psych normal mood and affect Neuro alert and oriented x 3 provides hx and follows commands  Access RIJ tunneled catheter in place         Renal-related home meds: Sensipar  30 mg MWF Midodrine  30 mg 3 times daily Renvela  2 AC 3 times daily      OP HD: MWF [facility] 4.5h  B400   180kg   TDC   NO heparin  (HIT)       Assessment/ Plan: MRSE bacteremia: will need a line holiday.  IR was  consulted for catheter removal after HD Monday, 5/19.  This was not removed.  Re-consulted IR for removal of catheter after extra HD on Tuesday/today.  I have requested replacement of tunneled catheter on 5/22 with IR.  He will need to be  NPO after midnight on 5/22 12:01 am.  (Will place this order tomorrow).  I have paused eliquis  5 mg BID in anticipation new line Acute on chronic hypoxic / hypercarbic resp failure. Admit CXR w/ some IS pulm edema. Started CRRT 5/13, but CRRT was not efficient per charting so he was transitioned back to iHD on 5/16.   Optimize volume with HD.   Covid 19 PNA - therapies - per primary team and Optimize volume status with HD Chronic vol overload - optimize volume status with HD and fluid restrictions with patient is key, as well.  Discussed with patient  ESRD:  on HD per MWF schedule. Extra tx today to allow for HD then line holiday after catheter removal today  Chronic hypotension: takes 30mg  tid of midodrine  at home, cont here Hyperkalemia - lokelma  once daily for now.  HD today.  Low K diet restriction  Anemia of esrd: Hb acceptable; defer ESA for now Secondary hyperparathyroidism: hyperphos on renvela  at home - resumed Severe biventricular CHF: last echo 12/06/23 w/ EF < 20% and RV function severely reduced Atrial fibrillation - agents per primary team Hyponatremia - optimize volume and clearance with HD   Disposition -  RN states orders for transfer to progressive bed are in place   Recent Labs  Lab 12/12/23 0458 12/12/23 0505 12/13/23 0447  HGB 11.2*  --  10.9*  ALBUMIN   --  3.0* 2.8*  CALCIUM   --  9.7 9.4  PHOS  --  6.6* 5.6*  CREATININE  --  7.61* 6.54*  K  --  6.2* 4.9   No results for input(s): "IRON", "TIBC", "FERRITIN" in the last 168 hours. Inpatient medications:  (feeding supplement) PROSource Plus  30 mL Oral TID BM   apixaban   5 mg Oral BID   Chlorhexidine  Gluconate Cloth  6 each Topical Q0600   Chlorhexidine  Gluconate Cloth  6 each Topical Q0600   digoxin   0.125 mg Oral Once per day on Monday Wednesday Friday   feeding supplement  237 mL Oral BID BM   fentaNYL   1 patch Transdermal Q72H   levothyroxine   25 mcg Oral Q0600   midodrine   30 mg Oral TID WC   multivitamin   1 tablet Oral QHS   polyethylene glycol  17 g Oral Daily   pregabalin   50 mg Oral Daily   senna  1 tablet Oral Daily   sevelamer  carbonate  1,600 mg Oral TID WC   sodium zirconium cyclosilicate   10 g Oral Daily   venlafaxine  XR  37.5 mg Oral Daily    anticoagulant sodium citrate      vancomycin  Stopped (12/12/23 1432)   alteplase , anticoagulant sodium citrate , cyclobenzaprine , docusate sodium , feeding supplement (NEPRO CARB STEADY), feeding supplement (NEPRO CARB STEADY), lidocaine  (PF), lidocaine  (PF), lidocaine -prilocaine , lidocaine -prilocaine , ondansetron  (ZOFRAN ) IV, mouth rinse, oxyCODONE , pentafluoroprop-tetrafluoroeth, pentafluoroprop-tetrafluoroeth     Travontae Freiberger C Jermar Colter, MD 6:32 AM 12/13/2023

## 2023-12-13 NOTE — Progress Notes (Signed)
 Physical Therapy Treatment Patient Details Name: Phillip Young MRN: [id number] DOB: [birth year] Today's Date: 12/13/2023   History of Present Illness 38 yo man admitted 5/13 with hypotension and shock. COVID positive with respiratory issues as well.  PMH:  ESRD on HD, biventricular heart failure EF<20%, morbid obesity, chronic respiratory failure on 4LNC during the day, NIV    PT Comments  Pt received in supine, pt lethargic today but agreeable to some bed-level exercises with encouragement, pt's mother present and also encouraging him to participate as able. PTA discussed with pt and his mother about possible disposition options given his recent deconditioning compared with his PLOF, both seemed agreeable at this time to consider LTACH setting, case mgmt notified. Pt and mother instructed on supine BLE exercises for ROM/strengthening (handout provided) and discussion on pressure relief and edema mgmt techniques while in supine. Pt may benefit from handout on CHF mgmt and pressure relief next session if indicated. Discussed disposition update with supervising PT Ryan L, and pt also was supposed to have fitting for his power WC on day of admission so he would benefit from rescheduling that appointment as well or if he could get it done while at post-acute setting or hospital that would be ideal as per his mother he has missed multiple appointments to get fitted for specialty wheelchair due to recent hospital admissions.   If plan is discharge home, recommend the following: Assistance with cooking/housework;Help with stairs or ramp for entrance;Assist for transportation;A lot of help with walking and/or transfers;A lot of help with bathing/dressing/bathroom (anticipated pending progress)   Can travel by private vehicle        Equipment Recommendations  Other (comment) (power wheelchair (pt may need new appointment as he missed his apt for chair fitting the day he came to hospital))    Recommendations  for Other Services       Precautions / Restrictions Precautions Precautions: Fall Recall of Precautions/Restrictions: Impaired Precaution/Restrictions Comments: COVID on precautions; BLE edema, watch BP Restrictions Weight Bearing Restrictions Per Provider Order: No     Mobility  Bed Mobility Overal bed mobility: Needs Assistance             General bed mobility comments: pt defers, but was able to pull shoulders away from bed surface partially using BUE on bed side rails, pt defers long sitting or transfer to EOB due to c/o fatigue.    Transfers                   General transfer comment: Unable today    Ambulation/Gait               General Gait Details: Doesnt walk at home   Stairs             Wheelchair Mobility     Tilt Bed    Modified Rankin (Stroke Patients Only)       Balance                                            Communication Communication Communication: No apparent difficulties Factors Affecting Communication: Reduced clarity of speech;Difficulty expressing self (Pt reported that they have difficulties with speech due to damaged voice box and spoke softly)  Cognition Arousal: Lethargic Behavior During Therapy: Flat affect   PT - Cognitive impairments: Safety/Judgement, Initiation  PT - Cognition Comments: Pt appears drowsy, frequently closing eyes or having difficulty paying attention to questions/instructions. [name] mother present and encouraging him to participate in session; handout provided to his mother for supine BLE/UE exercises so she can encourage him to increased bed-level ROM as tolerated, pt's mother appreciative. Following commands: Impaired Following commands impaired: Only follows one step commands consistently    Cueing Cueing Techniques: Verbal cues, Gestural cues, Tactile cues  Exercises General Exercises - Lower Extremity Ankle Circles/Pumps: AROM,  Supine, Both, 10 reps, AAROM (AA on RLE) Quad Sets: AROM, 5 reps, Supine, Both Heel Slides: AROM, 10 reps, Supine, AAROM, Both Hip ABduction/ADduction: AROM, Left, 10 reps, Supine, Other (comment) (cues to "turn your toes up and bring your knees together" for L hip IR and ADduction) Straight Leg Raises: AROM, AAROM, Right, 10 reps, Supine    General Comments General comments (skin integrity, edema, etc.): pt with BLE edema, reviewed with pt and his mother how to place bed in more of a chair posture and bed positioned more in Trendelenburg posture wtih BLE elevated for edema relief, also pillows positioned under BLE to increase elevation/float his heels and pillow placed to L lateral side of LLE to try to decrease L hip ER.      Pertinent Vitals/Pain Pain Assessment Pain Assessment: Faces Faces Pain Scale: Hurts even more Pain Location: LLE>RLE, generalized Pain Descriptors / Indicators: Aching, Discomfort, Grimacing, Guarding Pain Intervention(s): Limited activity within patient's tolerance, Monitored during session, Repositioned, Premedicated before session    Home Living                          Prior Function            PT Goals (current goals can now be found in the care plan section) Acute Rehab PT Goals Patient Stated Goal: to get better PT Goal Formulation: With patient/family Time For Goal Achievement: 12/23/23 Progress towards PT goals: Progressing toward goals (slowly)    Frequency    Min 2X/week      PT Plan      Co-evaluation              AM-PAC PT "6 Clicks" Mobility   Outcome Measure  Help needed turning from your back to your side while in a flat bed without using bedrails?: A Little Help needed moving from lying on your back to sitting on the side of a flat bed without using bedrails?: A Lot ((anticipated)) Help needed moving to and from a bed to a chair (including a wheelchair)?: Total Help needed standing up from a chair using your  arms (e.g., wheelchair or bedside chair)?: Total Help needed to walk in hospital room?: Total Help needed climbing 3-5 steps with a railing? : Total 6 Click Score: 9    End of Session Equipment Utilized During Treatment: Oxygen  (12L HFNC) Activity Tolerance: Patient limited by fatigue;Patient limited by lethargy;Other (comment) (pt defers bed chair posture or EOB transfers) Patient left: in bed;with call bell/phone within reach;with bed alarm set;with family/visitor present (Mother present) Nurse Communication: Mobility status;Need for lift equipment;Other (comment) (pt defers EOB or to eat his meal) PT Visit Diagnosis: Other abnormalities of gait and mobility (R26.89);Muscle weakness (generalized) (M62.81);Pain Pain - Right/Left: Left Pain - part of body: Leg     Time: 1610-9604 PT Time Calculation (min) (ACUTE ONLY): 29 min  Charges:    $Therapeutic Exercise: 8-22 mins $Therapeutic Activity: 8-22 mins PT General Charges $$ ACUTE PT  VISIT: 1 Visit                     Salman Wellen P., PTA Acute Rehabilitation Services Secure Chat Preferred 9a-5:30pm Office: [phone]    Mariel Shope [facility] 12/13/2023, 5:52 PM

## 2023-12-13 NOTE — Progress Notes (Signed)
                 Subjective Denies chest pain/SOB. Left leg feels like it's cramping from edema.   Physical exam Blood pressure 127/88, pulse (!) 119, temperature 98.5 F (36.9 C), temperature source Oral, resp. rate 16, height 6' (1.829 m), weight (!) 176 kg, SpO2 98%.  Constitutional: morbidly obese, chronically ill-appearing, NAD Neurological: alert & oriented Skin: HD cath without surrounding signs of infection, anasarca  Weight change: 5.7 kg   Intake/Output Summary (Last 24 hours) at 12/13/2023 1359 Last data filed at 12/13/2023 1337 Gross per 24 hour  Intake 1162 ml  Output 5500 ml  Net -4338 ml   Net IO Since Admission: -7,848.53 mL [12/13/23 1359]  Labs, images, and other studies    Latest Ref Rng & Units 12/13/2023    4:47 AM 12/12/2023    4:58 AM 12/11/2023    2:49 AM  CBC  WBC 4.0 - 10.5 K/uL 8.6  7.9  8.8   Hemoglobin 13.0 - 17.0 g/dL 09.8  11.9  14.7   Hematocrit 39.0 - 52.0 % 34.8  35.5  33.1   Platelets 150 - 400 K/uL 244  247  220        Latest Ref Rng & Units 12/13/2023    4:47 AM 12/12/2023    5:05 AM 12/11/2023    3:28 PM  BMP  Glucose 70 - 99 mg/dL 829  84  94   BUN 6 - 20 mg/dL 40  49  44   Creatinine 0.61 - 1.24 mg/dL 5.62  1.30  8.65   Sodium 135 - 145 mmol/L 128  128  126   Potassium 3.5 - 5.1 mmol/L 4.9  6.2  5.4   Chloride 98 - 111 mmol/L 91  92  91   CO2 22 - 32 mmol/L 24  21  18    Calcium  8.9 - 10.3 mg/dL 9.4  9.7  9.6      Assessment and plan Hospital day 7  Lotus Gover is a 38 y.o.male with PMH significant biventricular HF (EF < 20%), chronic Afib on Eliquis , chronic hypotension on midodrine  30mg  TID, ESRD on MWF iHD, chronic hypoxic/ hypercarbic respiratory failure on 2-4L [place] and NIV, severe morbid obesity, and chronic pain admitted for MRSE bacteremia and management of comorbidities.    MRSE bacteremia, presumed endocarditis due to inabiltiy to tolerate TEE ID following, appreciate recommendations. Afebrile without leukocytosis.  Repeat [facility] 5/17 NGTD. Continue Vanc with HD with plan for 6 week course EOT 6/26. Plan for line holiday after HD today with plan to replace 5/22. Eliquis  held pending procedure. -Trend Vitals/Labs   Acute on chronic hypoxemic and hypercapnic respiratory failure, OHS/OSA Acutely Decompensated HFrEF EF 20% ESRD with volume overload Hyponatremia Incidental covid positive Atrial Fibrillation Appreciate Nephrology recommendations. Continue inpatient HD MWF with additional session today.  Labs are stable. -Continue Digoxin , Eliquis , Midodrine  -Airborne precautions   Constipation No BM since 5/13. Refusing bowel regimen. States he went 17 days without BM recently.   Chronic Pain -Continue Lyrica  and Effexor  -Oxycodone  prn -Flexeril  prn   Hypothyroidism -Continue home Synthroid   Ronni Colace, DO 12/13/2023, 1:59 PM  Pager: [phone] After 5pm or weekend: [phone]

## 2023-12-13 NOTE — Progress Notes (Signed)
 Interventional Radiology Procedure Note  Risks and benefits of removal of hemodialysis catheter were discussed with the patient including, but not limited to bleeding, hematoma, infection, damage to adjacent structures.  All of the patient's questions were answered, patient is agreeable to proceed. Consent signed and in chart. PROCEDURE SUMMARY:  Successful removal of tunneled dialysis catheter.  No complications.   EBL = trace  Please see full dictation in imaging section of Epic for procedure details.  Marceil Sensor NP 12/13/2023 3:47 PM

## 2023-12-14 ENCOUNTER — Encounter (HOSPITAL_COMMUNITY): Payer: Self-pay | Admitting: Critical Care Medicine

## 2023-12-14 LAB — CBC
HCT: 36.9 % — ABNORMAL LOW (ref 39.0–52.0)
Hemoglobin: 11.4 g/dL — ABNORMAL LOW (ref 13.0–17.0)
MCH: 31 pg (ref 26.0–34.0)
MCHC: 30.9 g/dL (ref 30.0–36.0)
MCV: 100.3 fL — ABNORMAL HIGH (ref 80.0–100.0)
Platelets: 250 10*3/uL (ref 150–400)
RBC: 3.68 MIL/uL — ABNORMAL LOW (ref 4.22–5.81)
RDW: 17.3 % — ABNORMAL HIGH (ref 11.5–15.5)
WBC: 8 10*3/uL (ref 4.0–10.5)
nRBC: 0 % (ref 0.0–0.2)

## 2023-12-14 LAB — GLUCOSE, CAPILLARY
Glucose-Capillary: 102 mg/dL — ABNORMAL HIGH (ref 70–99)
Glucose-Capillary: 103 mg/dL — ABNORMAL HIGH (ref 70–99)
Glucose-Capillary: 115 mg/dL — ABNORMAL HIGH (ref 70–99)
Glucose-Capillary: 127 mg/dL — ABNORMAL HIGH (ref 70–99)
Glucose-Capillary: 95 mg/dL (ref 70–99)
Glucose-Capillary: 97 mg/dL (ref 70–99)

## 2023-12-14 LAB — RENAL FUNCTION PANEL
Albumin: 3 g/dL — ABNORMAL LOW (ref 3.5–5.0)
Anion gap: 17 — ABNORMAL HIGH (ref 5–15)
BUN: 34 mg/dL — ABNORMAL HIGH (ref 6–20)
CO2: 24 mmol/L (ref 22–32)
Calcium: 9.7 mg/dL (ref 8.9–10.3)
Chloride: 92 mmol/L — ABNORMAL LOW (ref 98–111)
Creatinine, Ser: 5.77 mg/dL — ABNORMAL HIGH (ref 0.61–1.24)
GFR, Estimated: 12 mL/min — ABNORMAL LOW (ref >60)
Glucose, Bld: 86 mg/dL (ref 70–99)
Phosphorus: 5.2 mg/dL — ABNORMAL HIGH (ref 2.5–4.6)
Potassium: 4.7 mmol/L (ref 3.5–5.1)
Sodium: 133 mmol/L — ABNORMAL LOW (ref 135–145)

## 2023-12-14 LAB — MAGNESIUM: Magnesium: 2.3 mg/dL (ref 1.7–2.4)

## 2023-12-14 MED ORDER — VANCOMYCIN HCL IN DEXTROSE 1-5 GM/200ML-% IV SOLN
1000.0000 mg | INTRAVENOUS | Status: DC
  Filled 2023-12-14 (×2): qty 200

## 2023-12-14 MED ORDER — CHLORHEXIDINE GLUCONATE CLOTH 2 % EX PADS: 6.0000 | MEDICATED_PAD | Freq: Every day | CUTANEOUS | Status: DC

## 2023-12-14 MED ORDER — VITAMIN C 500 MG PO TABS
250.0000 mg | ORAL_TABLET | Freq: Every day | ORAL | Status: DC
  Administered 2023-12-14 – 2023-12-15 (×2): 250 mg via ORAL
  Filled 2023-12-14 (×2): qty 1

## 2023-12-14 MED ORDER — CAMPHOR-MENTHOL 0.5-0.5 % EX LOTN
TOPICAL_LOTION | CUTANEOUS | Status: DC | PRN
  Filled 2023-12-14: qty 222

## 2023-12-14 MED ORDER — ZINC SULFATE 220 (50 ZN) MG PO CAPS
220.0000 mg | ORAL_CAPSULE | Freq: Every day | ORAL | Status: DC
  Administered 2023-12-14 – 2023-12-15 (×2): 220 mg via ORAL
  Filled 2023-12-14 (×2): qty 1

## 2023-12-14 MED ORDER — HYDROXYZINE HCL 25 MG PO TABS
25.0000 mg | ORAL_TABLET | Freq: Three times a day (TID) | ORAL | Status: DC | PRN
Start: 2023-12-14 — End: 2023-12-16
  Administered 2023-12-14: 25 mg via ORAL
  Filled 2023-12-14: qty 1

## 2023-12-14 NOTE — Progress Notes (Signed)
   12/13/23 2333  BiPAP/CPAP/SIPAP  Reason BIPAP/CPAP not in use Non-compliant (pt refused at this time.)  BiPAP/CPAP /SiPAP Vitals  Temp 98.3 F (36.8 C)  MEWS Score/Color  MEWS Score 1  MEWS Score Color Green

## 2023-12-14 NOTE — Progress Notes (Signed)
 Diagnosis: Line associated infection -- mrse Can't r/o endocarditis Esrd Noncompliant  Patient to get new line tomorrow 5/22, dialyzed and discharged to ltac  Culture Result: 5/13 bcx mrse; 5/17 and 5/20 bcx negative  Maintain standard isolation precaution  Discussed with primary team   Allergies  Allergen Reactions   Amiodarone  Other (See Comments)    Suspicion for amiodarone  lung/hepatotoxicity    Coreg  [Carvedilol ] Shortness Of Breath and Diarrhea    Wheezing    Heparin  Other (See Comments)    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, SRA positive   Metoprolol  Other (See Comments)    near syncope   Amoxicillin  Other (See Comments)    Was hospitalized    Other Swelling and Other (See Comments)    Steroids Fluid seeping out of legs    Tramadol      OPAT Orders Discharge antibiotics to be given via PICC line Discharge antibiotics: Vancomycin  Per facility pharmacy protocol  Aim for Vancomycin  trough 15-20 or AUC 400-550 (unless otherwise indicated)  Duration: 6 weeks from 5/20  End Date: 6/30  [facility] Care Per Protocol:  Home health RN for IV administration and teaching; PICC line care and labs.    Labs weekly while on IV antibiotics: _x_ CBC with differential __ BMP _x_ CMP __ CRP __ ESR __ Vancomycin  trough __ CK  __ Please pull PIC at completion of IV antibiotics __ Please leave PIC in place until doctor has seen patient or been notified  Fax weekly labs to [phone]  Clinic Follow Up Appt: 6/3 with dr Ernie Heal  @  [facility] [address], [place], [place] [address] Phone: [phone]              Jamesetta Mcbride, MD [facility] [phone]  pager   [phone] cell 12/14/2023, 8:32 PM   I spent more than 15 minute reviewing data/chart, and coordinating care, providing treatment plan with treatment team

## 2023-12-14 NOTE — Progress Notes (Signed)
 Patient requested to take a picture/video of his dirty linens after he was informed that it is very dirty. After seeing the video, he agreed to only change the bed pad and pillow case and refused bath.

## 2023-12-14 NOTE — Progress Notes (Signed)
 Subjective Generalized pruritus most notably on thighs. LE wounds have been bothersome.   Physical exam Blood pressure (!) 123/93, pulse (!) 126, temperature 98.3 F (36.8 C), temperature source Oral, resp. rate 19, height 6' (1.829 m), weight (!) 171.2 kg, SpO2 99%.  Constitutional: morbidly obese, chronically ill-appearing, NAD Pulmonary/Chest: normal work of breathing on 11 L HFNC Skin: HD cath removal site without signs of infection, left thigh with posteromedial 2 cm wound without active draining (re-covered with new bandage), left dorsal foot with 2 cm wounding with mild purulent draining (not overtly malodorous)   Weight change: -6 kg   Intake/Output Summary (Last 24 hours) at 12/14/2023 0639 Last data filed at 12/14/2023 4098 Gross per 24 hour  Intake 1897 ml  Output 5500 ml  Net -3603 ml   Net IO Since Admission: -6,428.53 mL [12/14/23 0639]  Labs, images, and other studies    Latest Ref Rng & Units 12/14/2023    4:18 AM 12/13/2023    4:47 AM 12/12/2023    4:58 AM  CBC  WBC 4.0 - 10.5 K/uL 8.0  8.6  7.9   Hemoglobin 13.0 - 17.0 g/dL 11.9  14.7  82.9   Hematocrit 39.0 - 52.0 % 36.9  34.8  35.5   Platelets 150 - 400 K/uL 250  244  247        Latest Ref Rng & Units 12/14/2023    4:18 AM 12/13/2023    4:47 AM 12/12/2023    5:05 AM  BMP  Glucose 70 - 99 mg/dL 86  562  84   BUN 6 - 20 mg/dL 34  40  49   Creatinine 0.61 - 1.24 mg/dL 1.30  8.65  7.84   Sodium 135 - 145 mmol/L 133  128  128   Potassium 3.5 - 5.1 mmol/L 4.7  4.9  6.2   Chloride 98 - 111 mmol/L 92  91  92   CO2 22 - 32 mmol/L 24  24  21    Calcium  8.9 - 10.3 mg/dL 9.7  9.4  9.7      Assessment and plan Hospital day 8  Phillip Young is a 38 y.o.male with PMH significant biventricular HF (EF < 20%), chronic Afib on Eliquis , chronic hypotension on midodrine  30mg  TID, ESRD on MWF iHD, chronic hypoxic/ hypercarbic respiratory failure on 2-4L [place] and NIV, severe morbid obesity, and chronic  pain admitted for MRSE bacteremia and management of comorbidities.    MRSE bacteremia, presumed endocarditis due to inabiltiy to tolerate TEE Afebrile without leukocytosis. Repeat [facility] 5/17 NGTD. TDC removed yesterday without complication. Plan to replace tomorrow. Repeat BC after removal with NGTD. Continue Vanc with HD with plan for 6 week course EOT 6/26. NPO @ midnight and Eliquis  held pending procedure. -Trend Vitals/Labs   Acute on chronic hypoxemic and hypercapnic respiratory failure, OHS/OSA Acutely Decompensated HFrEF EF 20% ESRD with volume overload Hyponatremia Incidental covid positive Atrial Fibrillation Appreciate Nephrology recommendations. Continue inpatient HD MWF though he often requires additional sessions.  Labs are stable. -Continue Digoxin , Eliquis , Midodrine  -Airborne precautions  Left Thigh and Foot Wounds As noted above. Wound care consult today with improved management. No present concern for cellulitis.  Pruritus -Order Atarax  and Sarna lotion   Constipation No BM since 5/13. Refusing bowel regimen. States he went 17 days without BM recently.   Chronic Pain -Continue Lyrica  and Effexor  -Oxycodone  prn -Flexeril  prn   Hypothyroidism -  Continue home Synthroid   Ronni Colace, DO 12/14/2023, 6:39 AM  Pager: [phone] After 5pm or weekend: [phone]

## 2023-12-14 NOTE — Consult Note (Signed)
 Chief Complaint: Renal failure on hemodialysis  Referring Provider(s): Shaune Delaine  Supervising Physician: Creasie Doctor  Patient Status: [facility] - In-pt  History of Present Illness: Phillip Young is a 38 y.o. male who is known to our service for previous removal/placement of tunneled HD catheters.  He is admitted with MRSE Bacteremia and his HD catheter was removed yesterday.  We are asked to place a new line tomorrow after his line holiday.  Patient is Full Code  Past Medical History:  Diagnosis Date   Acute on chronic respiratory failure with hypoxia ([name]) 04/21/2021   Acute on chronic systolic (congestive) heart failure ([name]) 02/26/2020   Amiodarone  toxicity    Anemia    Atrial flutter ([name])    Biventricular congestive heart failure ([name])    Chronic hypoxemic respiratory failure ([name])    Class 3 severe obesity due to excess calories with serious comorbidity and body mass index (BMI) of 50.0 to 59.9 in adult 02/26/2020   ESRD on hemodialysis [facility])    Essential hypertension 02/26/2020   GERD without esophagitis 02/26/2020   Hidradenitis suppurativa 02/26/2020   NICM (nonischemic cardiomyopathy) ([name])    Obesity hypoventilation syndrome ([name])    OSA (obstructive sleep apnea)    PAF (paroxysmal atrial fibrillation) ([name])    Pneumonia    Prediabetes 02/26/2020    Past Surgical History:  Procedure Laterality Date   ABSCESS DRAINAGE     AV FISTULA PLACEMENT Left 08/21/2021   Procedure: LEFT ARM ARTERIOVENOUS (AV) FISTULA.;  Surgeon: Margherita Shell, MD;  Location: [facility] OR;  Service: Vascular;  Laterality: Left;   CARDIAC CATHETERIZATION     CARDIOVERSION N/A 10/09/2021   Procedure: CARDIOVERSION;  Surgeon: Darlis Eisenmenger, MD;  Location: [facility] ENDOSCOPY;  Service: Cardiovascular;  Laterality: N/A;   CARDIOVERSION N/A 05/28/2022   Procedure: CARDIOVERSION;  Surgeon: Darlis Eisenmenger, MD;  Location: [facility] ENDOSCOPY;  Service: Cardiovascular;  Laterality: N/A;    CARDIOVERSION N/A 06/07/2022   Procedure: CARDIOVERSION;  Surgeon: Darlis Eisenmenger, MD;  Location: [facility] ENDOSCOPY;  Service: Cardiovascular;  Laterality: N/A;   IR FLUORO GUIDE CV LINE RIGHT  03/10/2021   IR FLUORO GUIDE CV LINE RIGHT  04/22/2021   IR FLUORO GUIDE CV LINE RIGHT  08/20/2021   IR FLUORO GUIDE CV LINE RIGHT  03/01/2023   IR FLUORO GUIDE CV LINE RIGHT  03/09/2023   IR FLUORO GUIDE CV LINE RIGHT  05/30/2023   IR FLUORO GUIDE CV LINE RIGHT  11/10/2023   IR PTA VENOUS EXCEPT DIALYSIS CIRCUIT  03/09/2023   IR REMOVAL TUN CV CATH W/O FL  05/26/2023   IR REMOVAL TUN CV CATH W/O FL  11/07/2023   IR REMOVAL TUN CV CATH W/O FL  12/13/2023   IR REMOVE CV FIBRIN SHEATH  03/09/2023   IR US  GUIDE VASC ACCESS RIGHT  03/10/2021   IR US  GUIDE VASC ACCESS RIGHT  04/22/2021   IR US  GUIDE VASC ACCESS RIGHT  05/30/2023   IR US  GUIDE VASC ACCESS RIGHT  11/10/2023   RIGHT HEART CATH N/A 03/06/2021   Procedure: RIGHT HEART CATH;  Surgeon: Mardell Shade, MD;  Location: [facility] INVASIVE CV LAB;  Service: Cardiovascular;  Laterality: N/A;   RIGHT/LEFT HEART CATH AND CORONARY ANGIOGRAPHY N/A 03/04/2020   Procedure: RIGHT/LEFT HEART CATH AND CORONARY ANGIOGRAPHY;  Surgeon: Darlis Eisenmenger, MD;  Location: [facility] INVASIVE CV LAB;  Service: Cardiovascular;  Laterality: N/A;   TEE WITHOUT CARDIOVERSION N/A 05/05/2021   Procedure: TRANSESOPHAGEAL ECHOCARDIOGRAM (TEE);  Surgeon: Darlis Eisenmenger, MD;  Location: [facility] ENDOSCOPY;  Service: Cardiovascular;  Laterality: N/A;   TEMPORARY DIALYSIS CATHETER  03/06/2021   Procedure: TEMPORARY DIALYSIS CATHETER;  Surgeon: Mardell Shade, MD;  Location: [facility] INVASIVE CV LAB;  Service: Cardiovascular;;    Allergies: Amiodarone , Coreg  [carvedilol ], Heparin , Metoprolol , Amoxicillin , Other, and Tramadol   Medications: Prior to Admission medications   Medication Sig Start Date End Date Taking? Authorizing Provider  apixaban  (ELIQUIS ) 5 MG TABS tablet Take 1 tablet (5 mg total) by  mouth 2 (two) times daily. 06/03/23  Yes Juberg, Veryl Gottron, DO  clindamycin  (CLINDAGEL ) 1 % gel Apply topically 2 (two) times daily. Patient taking differently: Apply 1 Application topically 2 (two) times daily. 10/06/23  Yes Carleen Chary, DO  Darbepoetin Alfa  (ARANESP ) 150 MCG/0.3ML SOSY injection To be given during dialysis by [facility] per Nephrology. Not to be given to patient at SNF. 09/06/23  Yes Sheree Dieter, MD  digoxin  (LANOXIN ) 0.125 MG tablet Take 1 tablet (0.125 mg total) by mouth 3 (three) times a week. Monday, Wednesday, Friday (dialysis days) 11/14/23  Yes [name], MD  fentaNYL  (DURAGESIC ) 12 MCG/HR Place 1 patch onto the skin every 3 (three) days. 11/14/23  Yes [name], MD  levothyroxine  (SYNTHROID ) 25 MCG tablet Take 1 tablet (25 mcg total) by mouth daily at 6 (six) AM. 11/12/23  Yes [name], MD  midodrine  (PROAMATINE ) 10 MG tablet Take 3 tablets (30 mg total) by mouth 3 (three) times daily with meals. 06/20/23  Yes Setzer, Hamp Levine, PA-C  Oxycodone  HCl 10 MG TABS Take 1 tablet (10 mg total) by mouth every 6 (six) hours as needed. Patient taking differently: Take 10 mg by mouth every 6 (six) hours. 12/02/23  Yes Juberg, Veryl Gottron, DO  pregabalin  (LYRICA ) 50 MG capsule Take 1 capsule (50 mg total) by mouth daily. Take one capsule daily. 11/12/23  Yes [name], MD  sevelamer  carbonate (RENVELA ) 800 MG tablet Take 2 tablets (1,600 mg total) by mouth 3 (three) times daily with meals. Patient taking differently: Take 2,400 mg by mouth 3 (three) times daily with meals. 11/12/23  Yes [name], MD  vancomycin  IVPB Inject 1,000 mg into the vein every Monday, Wednesday, and Friday with hemodialysis. Indication:  MRSE + E faecalis bacteremia Last Day of Therapy:  12/18/23 Labs - weekly w/ HD:  CBC/D, BMP, and vancomycin  trough; ESR and CRP Method of administration:Elastomeric or per HD protocol - to be given at the HD center 11/09/23 12/18/23 Yes Ernie Heal, Jerelyn Money, MD  venlafaxine  XR (EFFEXOR  XR) 37.5 MG 24 hr capsule Take 1 capsule (37.5 mg total) by mouth daily. 11/12/23 11/11/24 Yes [name], MD  vitamin D3 (CHOLECALCIFEROL ) 25 MCG tablet Take 0.5 tablets (500 Units total) by mouth daily. 11/12/23  Yes [name], MD  acetaminophen  (TYLENOL ) 500 MG tablet Take 2 tablets (1,000 mg total) by mouth every 6 (six) hours. Patient not taking: Reported on 12/06/2023 09/06/23   Malen Scudder, DO  cinacalcet  (SENSIPAR ) 30 MG tablet Take 1 tablet (30 mg total) by mouth every dialysis. Patient taking differently: Take 30 mg by mouth every Monday, Wednesday, and Friday. 09/12/23   Youssefzadeh, Keon, MD  naloxone (NARCAN) 2 MG/2ML injection Inject 2 mg into the skin as needed. Patient not taking: Reported on 11/03/2023    [provider]  tirzepatide (ZEPBOUND) 2.5 MG/0.5ML injection vial Inject 2.5 mg into the skin once a week. Patient not taking: Reported on 10/11/2023 08/25/23   Sheree Dieter,  MD     Family History  Problem Relation Age of Onset   Heart disease Mother    Hypertension Mother    Pulmonary Hypertension Mother    Drug abuse Father        died due to Heroin overdose    Social History   Socioeconomic History   Marital status: Single    Spouse name: Not on file   Number of children: Not on file   Years of education: Not on file   Highest education level: Not on file  Occupational History   Not on file  Tobacco Use   Smoking status: Former    Current packs/day: 0.00    Types: Cigarettes    Quit date: 2019    Years since quitting: 6.3    Passive exposure: Current   Smokeless tobacco: Former   Tobacco comments:    quit in 2019  Vaping Use   Vaping status: Never Used  Substance and Sexual Activity   Alcohol  use: Never   Drug use: Never   Sexual activity: Not Currently  Other Topics Concern   Not on file  Social History Narrative   Not on file   Social Drivers of Health   Financial Resource Strain:  Low Risk  (10/06/2023)   Overall Financial Resource Strain (CARDIA)    Difficulty of Paying Living Expenses: Not hard at all  Food Insecurity: No Food Insecurity (12/06/2023)   Hunger Vital Sign    Worried About Running Out of Food in the Last Year: Never true    Ran Out of Food in the Last Year: Never true  Recent Concern: Food Insecurity - Food Insecurity Present (11/01/2023)   Hunger Vital Sign    Worried About Running Out of Food in the Last Year: Sometimes true    Ran Out of Food in the Last Year: Never true  Transportation Needs: Unmet Transportation Needs (12/06/2023)   PRAPARE - Transportation    Lack of Transportation (Medical): Yes    Lack of Transportation (Non-Medical): Yes  Physical Activity: Insufficiently Active (10/06/2023)   Exercise Vital Sign    Days of Exercise per Week: 7 days    Minutes of Exercise per Session: 10 min  Stress: No Stress Concern Present (10/06/2023)   [employer] of Occupational Health - Occupational Stress Questionnaire    Feeling of Stress : Not at all  Social Connections: Socially Isolated (10/06/2023)   Social Connection and Isolation Panel [NHANES]    Frequency of Communication with Friends and Family: More than three times a week    Frequency of Social Gatherings with Friends and Family: Once a week    Attends Religious Services: Never    Database administrator or Organizations: No    Attends Engineer, structural: Never    Marital Status: Never married     Vital Signs: BP 90/70 (BP Location: Right Arm)   Pulse (!) 119   Temp 98 F (36.7 C) (Oral)   Resp (!) 23   Ht 6' (1.829 m)   Wt (!) 377 lb 6.8 oz (171.2 kg)   SpO2 100%   BMI 51.19 kg/m   Advance Care Plan: The advanced care place/surrogate decision maker was discussed at the time of visit and the patient did not wish to discuss or was not able to name a surrogate decision maker or provide an advance care plan.  Physical Exam Vitals reviewed.  Constitutional:       Appearance: Normal appearance. He is obese.  HENT:     Head: Normocephalic and atraumatic.  Eyes:     Extraocular Movements: Extraocular movements intact.  Cardiovascular:     Rate and Rhythm: Normal rate and regular rhythm.  Pulmonary:     Effort: Pulmonary effort is normal. No respiratory distress.     Breath sounds: Normal breath sounds.  Abdominal:     General: There is no distension.     Palpations: Abdomen is soft.     Tenderness: There is no abdominal tenderness.  Musculoskeletal:        General: Normal range of motion.     Cervical back: Normal range of motion.  Skin:    General: Skin is warm and dry.  Neurological:     General: No focal deficit present.     Mental Status: He is alert and oriented to person, place, and time.  Psychiatric:        Mood and Affect: Mood normal.        Behavior: Behavior normal.        Thought Content: Thought content normal.        Judgment: Judgment normal.     Labs:  CBC: Recent Labs    12/11/23 0249 12/12/23 0458 12/13/23 0447 12/14/23 0418  WBC 8.8 7.9 8.6 8.0  HGB 10.3* 11.2* 10.9* 11.4*  HCT 33.1* 35.5* 34.8* 36.9*  PLT 220 247 244 250    COAGS: Recent Labs    11/01/23 1004 12/06/23 0430  INR 1.5* 3.5*    BMP: Recent Labs    12/11/23 1528 12/12/23 0505 12/13/23 0447 12/14/23 0418  NA 126* 128* 128* 133*  K 5.4* 6.2* 4.9 4.7  CL 91* 92* 91* 92*  CO2 18* 21* 24 24  GLUCOSE 94 84 102* 86  BUN 44* 49* 40* 34*  CALCIUM  9.6 9.7 9.4 9.7  CREATININE 6.91* 7.61* 6.54* 5.77*  GFRNONAA 10* 9* 10* 12*    LIVER FUNCTION TESTS: Recent Labs    09/07/23 1452 09/08/23 0339 11/01/23 1004 11/02/23 0523 12/06/23 0216 12/06/23 1541 12/07/23 0551 12/07/23 0552 12/11/23 1528 12/12/23 0505 12/13/23 0447 12/14/23 0418  BILITOT 1.1  --  1.0  --  2.3*  --  3.4*  --   --   --   --   --   AST 26  --  19  --  25  --  27  --   --   --   --   --   ALT 18  --  16  --  15  --  16  --   --   --   --   --   ALKPHOS 125   --  208*  --  239*  --  187*  --   --   --   --   --   PROT 6.8  --  7.2  --  7.8  --  7.2  --   --   --   --   --   ALBUMIN  3.3*   < > 3.1*   < > 3.3*   < > 3.0*   < > 2.8* 3.0* 2.8* 3.0*   < > = values in this interval not displayed.    TUMOR MARKERS: No results for input(s): "AFPTM", "CEA", "CA199", "CHROMGRNA" in the last 8760 hours.  Assessment and Plan:  MRSE bacteremia - s/p removal of HD cath for line holiday. Now needs replacement.  Will plan for replacement of the HD catheter tomorrow by Dr. Jinx Mourning.  Risks and benefits discussed with the patient including, but not limited to bleeding, infection, vascular injury, pneumothorax which may require chest tube placement, air embolism or even death  All of the patient's questions were answered, patient is agreeable to proceed. Consent signed and in chart.   Electronically Signed: Connor Deiters, PA-C   12/14/2023, 1:33 PM      I spent a total of  15 Minutes  in face to face in clinical consultation, greater than 50% of which was counseling/coordinating care for HD cath placement.

## 2023-12-14 NOTE — Consult Note (Signed)
 WOC Nurse Consult Note: Reason for Consult: buttock, thigh and foot wound. All POA; reviewed images and met with nursing Wound type: Skin tear posterior thigh Skin tear left foot  Pressure Injury POA: NA Measurement: see nursing flow sheet Wound bed: see nursing flow sheet; discussed with bedside nursing Drainage (amount, consistency, odor) see nursing flow sheet Periwound: edema  Dressing procedure/placement/frequency: Cleanse wounds with saline, cover with single layer of xeroform gauze, top with kerlix on the foot, foam on the thigh  Re consult if needed, will not follow at this time. Thanks  Parissa Chiao [employer], RN,CWOCN, CNS, CWON-AP [phone])

## 2023-12-14 NOTE — Progress Notes (Signed)
 PHARMACY CONSULT NOTE FOR:  OUTPATIENT  PARENTERAL ANTIBIOTIC THERAPY With Dialysis   Indication: MRSE Bacteremia Regimen: Vancomycin  1000 mg with HD MWF  End date: 01/23/24  No formal OPAT will be done as patient will receive antibiotics with dialysis. Nephrology aware.     Thank you for allowing pharmacy to be a part of this patient's care.  Denson Flake, PharmD, BCPS, BCIDP Infectious Diseases Clinical Pharmacist Phone: [phone] 12/14/2023, 10:51 AM

## 2023-12-14 NOTE — TOC Progression Note (Signed)
 Transition of Care [facility]) - Progression Note    Patient Details  Name: Phillip Young MRN: [id number] Date of Birth: 15-Jul-1986  Transition of Care [facility]) CM/SW Contact  Tom-Johnson, Angelique Ken, RN Phone Number: 12/14/2023, 10:58 AM  Clinical Narrative:     CM spoke with patient at bedside and his mother, Phillip Young via phone about LTACH recommendation. Both agreed to [facility] and [facility] out of the options given from [email or web address] MD notified. CM called in referral to [facility] and DJ voiced acceptance, info on AVS. Plan to have HD access placed tomorrow and discharge after iHD.   CM will continue to follow as patient progresses with care towards discharge.        Expected Discharge Plan and Services                                               Social Determinants of Health (SDOH) Interventions SDOH Screenings   Food Insecurity: No Food Insecurity (12/06/2023)  Recent Concern: Food Insecurity - Food Insecurity Present (11/01/2023)  Housing: High Risk (12/06/2023)  Transportation Needs: Unmet Transportation Needs (12/06/2023)  Utilities: At Risk (12/06/2023)  Alcohol  Screen: Low Risk  (10/06/2023)  Depression (PHQ2-9): High Risk (10/06/2023)  Financial Resource Strain: Low Risk  (10/06/2023)  Physical Activity: Insufficiently Active (10/06/2023)  Social Connections: Socially Isolated (10/06/2023)  Stress: No Stress Concern Present (10/06/2023)  Tobacco Use: Medium Risk (12/06/2023)  Health Literacy: Adequate Health Literacy (10/06/2023)    Readmission Risk Interventions    10/13/2023   11:27 AM 09/23/2023    5:05 PM 07/05/2023    4:25 PM  Readmission Risk Prevention Plan  Transportation Screening Complete Complete Complete  HRI or Home Care Consult   Complete  Social Work Consult for Recovery Care Planning/Counseling   Complete  Palliative Care Screening   Not Applicable  Medication Review Oceanographer) Referral to Pharmacy Referral to Pharmacy Complete  PCP  or Specialist appointment within 3-5 days of discharge  Complete   HRI or Home Care Consult Complete Complete   SW Recovery Care/Counseling Consult Complete Complete   Palliative Care Screening Not Applicable Not Applicable   Skilled Nursing Facility Not Applicable Not Applicable

## 2023-12-14 NOTE — Progress Notes (Signed)
 Nutrition Follow-up  DOCUMENTATION CODES:   Morbid obesity  INTERVENTION:  Continue to encourage adequate PO intake Double protein portions with all meals Ensure Enlive po TID, each supplement provides 350 kcal and 20 grams of protein. Discontinue ProSoruce Renal MVI with minerals daily Vitamin C 250mg  daily and 220mg  zinc  daily x14 days to support wound healing  NUTRITION DIAGNOSIS:   Increased nutrient needs related to acute illness as evidenced by other (comment) (CRRT). - diagnosis ongoing  GOAL:   Patient will meet greater than or equal to 90% of their needs - goal unmet  MONITOR:   PO intake, Supplement acceptance, Labs, Weight trends, Skin, I & O's  REASON FOR ASSESSMENT:   Consult Assessment of nutrition requirement/status (CRRT protocol)  ASSESSMENT:   Pt admitted with hypotension and hypoxemia r/t cardiogenic shock due to acute on chronic biventricular HF and severe volume overload. Also found to be COVID+ on admission. PMH significant for ESRD on HD, biventricular HF, chronic afib, chronic hypotension, chronic respiratory failure, severe morbid obesity, chronic pain  5/13: admitted, CRRT initiated 5/14: diet advanced, renal 1.2L FR  5/16: CRRT discontinued; transitioned back to [facility] 5/20: tunneled HD cath removed in IR (line holiday)  Nephrology planning for replacement of tunneled HD cath on 5/22. TOC working on [employer] placement.   Spoke with pt at bedside. He is attempting to eat his breakfast which includes 2 cups of grapes, 2 juices, grilled chicken caesar  salad with extra ranch and double protein. However he was not consuming the double protein portion as it is "not the same meet that comes on the salad."  He endorses ongoing poor appetite/PO intake.  Pt does not like the prosource but states that has been consuming 3 Ensure vanilla when available.   Pt has not had a BM since 5/13. He expresses concerns of bed bound status and not being able to use the  restroom. Explained importance of having regular BM's to excrete toxins and avoid fecal impaction. [name] mom on phone states that he does not need medications, rather warm prune/apple juice usually helps him at home.   Discussed with pt importance of dialysis, medications and nutrition compliance.   Pt states that he is losing weight which he is pleased with. He states that "not eating" is what he needs to do to lose weight. Educated pt on importance of consuming adequate protein and nutrients to support muscle maintenance and avoid skin breakdown; as well as maintaining a variety of nutrients to support healthy/sustainable weight loss.   Admit weight: 181.4 kg Current weight: 171.2 kg EDW: 180 kg + anasarca  Drains/lines:  Last HD 5/20 Net UF 5.5L  Medications: rena-vit, miralax  daily, senna daily, renvela  TID, lokelma  Drips: abx  Labs:  Sodium 133 BUN 34 Cr 5.77 Anion gap 17 Phos 5.2 GFR 12 CBG's 83-113 x24 hours  Diet Order:   Diet Order             Diet NPO time specified  Diet effective midnight           Diet renal with fluid restriction Fluid restriction: 1200 mL Fluid; Room service appropriate? Yes; Fluid consistency: Thin  Diet effective now                   EDUCATION NEEDS:   Education needs have been addressed  Skin:  Skin Assessment: Skin Integrity Issues: Skin Integrity Issues:: Other (Comment) Other: 1.5 x 1cm L foot tear; 0.5 x 0.5cm L thigh  Last BM:  5/13  Height:   Ht Readings from Last 1 Encounters:  12/06/23 6' (1.829 m)    Weight:   Wt Readings from Last 1 Encounters:  12/14/23 (!) 171.2 kg    Ideal Body Weight:  80.9 kg  BMI:  Body mass index is 51.19 kg/m.  Estimated Nutritional Needs:   Kcal:  2300-2500  Protein:  160g  Fluid:  1L + UOP  Rocklin Chute, RDN, LDN Clinical Nutrition See AMiON for contact information.

## 2023-12-14 NOTE — Progress Notes (Addendum)
 [facility] Progress Note  Subjective:  Last HD on 5/20 with 5.5 kg removed.  IR removed his tunneled catheter after that.  He is going to watch fluids carefully today.  He states he is eating ice and I specifically counseled against this.  He is on 11 liters HFNC.   Review of systems:     Reports shortness of breath is better No chest pain Still with Leg discomfort  Denies n/v    Vitals:   12/14/23 0314 12/14/23 0400 12/14/23 0500 12/14/23 0600  BP:  (!) 107/39 (!) 96/53 (!) 123/93  Pulse:  (!) 114 (!) 122 (!) 126  Resp:  18 16 19   Temp: 98.3 F (36.8 C)     TempSrc: Oral     SpO2:  100% 100% 99%  Weight:   (!) 171.2 kg   Height:        Physical Exam:      General adult male in bed in no acute distress HEENT normocephalic atraumatic extraocular movements intact sclera anicteric Neck supple trachea midline Lungs reduced breath sounds; on 11 liters oxygen  per nasal cannula  Heart tachycardic; S1S2 Abdomen obese habitus; soft nontender nondistended Extremities 2+ edema bilateral lower extremities  Psych normal mood and affect Neuro alert and oriented x 3 provides hx and follows commands  Access bandage over old RIJ catheter site         Renal-related home meds: Sensipar  30 mg MWF Midodrine  30 mg 3 times daily Renvela  2 AC 3 times daily      OP HD: MWF [facility] 4.5h  B400   180kg   TDC   NO heparin  (HIT)       Assessment/ Plan: MRSE bacteremia: getting line holiday.  IR was  consulted and catheter was removed after HD on 5/20.  I have requested replacement of tunneled catheter on 5/22 with IR.  He will need to be NPO after midnight on 5/22 12:01 am.  Order placed. I have paused eliquis  5 mg BID in anticipation new line Acute on chronic hypoxic / hypercarbic resp failure. Admit CXR w/ some IS pulm edema. Started CRRT 5/13, but CRRT was not efficient per charting so he was transitioned back to iHD on 5/16.   Optimize volume with HD.   Covid 19 PNA -  therapies - per primary team and Optimize volume status with HD Chronic vol overload - optimize volume status with HD and fluid restrictions with patient is key, as well.  Discussed with patient  ESRD:  on HD per MWF schedule usually.   Had extra HD on Tues, 5/20 off schedule.  Will plan for next tx off schedule on 5/22 then on Sat and resume MWF schedule next week.   Chronic hypotension: takes 30mg  tid of midodrine  at home, cont here Hyperkalemia - lokelma  once daily for now esp given line holiday.  HD today.  Low K diet restriction  Anemia of esrd: Hb acceptable; defer ESA for now Secondary hyperparathyroidism: hyperphos on renvela  at home - resumed Severe biventricular CHF: last echo 12/06/23 w/ EF < 20% and RV function severely reduced Atrial fibrillation - agents per primary team Hyponatremia - optimize volume and clearance with HD   Disposition - RN states orders for transfer to progressive bed are in place   Recent Labs  Lab 12/13/23 0447 12/14/23 0418  HGB 10.9* 11.4*  ALBUMIN  2.8* 3.0*  CALCIUM  9.4 9.7  PHOS 5.6* 5.2*  CREATININE 6.54* 5.77*  K 4.9 4.7   No  results for input(s): "IRON", "TIBC", "FERRITIN" in the last 168 hours. Inpatient medications:  (feeding supplement) PROSource Plus  30 mL Oral TID BM   Chlorhexidine  Gluconate Cloth  6 each Topical Q0600   Chlorhexidine  Gluconate Cloth  6 each Topical Q0600   Chlorhexidine  Gluconate Cloth  6 each Topical Q0600   digoxin   0.125 mg Oral Once per day on Monday Wednesday Friday   feeding supplement  237 mL Oral BID BM   fentaNYL   1 patch Transdermal Q72H   levothyroxine   25 mcg Oral Q0600   midodrine   30 mg Oral TID WC   multivitamin  1 tablet Oral QHS   polyethylene glycol  17 g Oral Daily   pregabalin   50 mg Oral Daily   senna  1 tablet Oral Daily   sevelamer  carbonate  1,600 mg Oral TID WC   sodium zirconium cyclosilicate   10 g Oral Daily   venlafaxine  XR  37.5 mg Oral Daily    anticoagulant sodium citrate       anticoagulant sodium citrate      vancomycin  Stopped (12/12/23 1432)   alteplase , anticoagulant sodium citrate , anticoagulant sodium citrate , cyclobenzaprine , docusate sodium , feeding supplement (NEPRO CARB STEADY), feeding supplement (NEPRO CARB STEADY), lidocaine  (PF), lidocaine  (PF), lidocaine  (PF), lidocaine -prilocaine , lidocaine -prilocaine , lidocaine -prilocaine , ondansetron  (ZOFRAN ) IV, mouth rinse, oxyCODONE , pentafluoroprop-tetrafluoroeth, pentafluoroprop-tetrafluoroeth, pentafluoroprop-tetrafluoroeth     Tonie Elsey C Taj Nevins, MD 6:38 AM 12/14/2023   Addendum:  Spoke with pharmacy and primary team.  ID is planning another 6 weeks of Vanc 1 gm with HD for this patient ending June 30th.  He is going to [facility].   Nan Aver, MD 10:53 AM 12/14/2023

## 2023-12-14 NOTE — Evaluation (Signed)
 Occupational Therapy Evaluation Patient Details Name: Phillip Young MRN: [id number] DOB: [birth year] Today's Date: 12/14/2023   History of Present Illness   38 yo man admitted 5/13 with hypotension and shock. COVID positive with respiratory issues as well.  PMH:  ESRD on HD, biventricular heart failure EF<20%, morbid obesity, chronic respiratory failure on 4LNC during the day, NIV     Clinical Impressions Pt reports washing up at bed level, assist for some ADLs, pivot transfers to w/c at baseline. Pt lives with his mother. Pt currently declining OOB mobility, needs mod-total A for ADLs, able to use overhead rails to pull self up in bed, assist to shift hips to L. Pt with good UB strength, incr time spent stretching LLE to neutral position as pt uncomfortable with LLE resting in external rotation. Pt presenting with impairments listed below, will follow acutely. Recommend OT at [facility] at d/c.      If plan is discharge home, recommend the following:   Two people to help with walking and/or transfers;A lot of help with bathing/dressing/bathroom;Assistance with cooking/housework;Direct supervision/assist for financial management;Direct supervision/assist for medications management;Assist for transportation;Help with stairs or ramp for entrance     Functional Status Assessment   Patient has had a recent decline in their functional status and demonstrates the ability to make significant improvements in function in a reasonable and predictable amount of time.     Equipment Recommendations   Other (comment) (defer)     Recommendations for Other Services   PT consult     Precautions/Restrictions   Precautions Precautions: Fall Recall of Precautions/Restrictions: Impaired Precaution/Restrictions Comments: COVID on precautions; BLE edema, watch BP Restrictions Weight Bearing Restrictions Per Provider Order: No     Mobility Bed Mobility Overal bed mobility: Needs Assistance              General bed mobility comments: reaches overhead for rails to pull self up in bed needs total A to shift hips to R    Transfers                   General transfer comment: deferred, will need +2      Balance                                           ADL either performed or assessed with clinical judgement   ADL Overall ADL's : Needs assistance/impaired Eating/Feeding: Set up;Bed level   Grooming: Set up;Bed level   Upper Body Bathing: Moderate assistance;Bed level   Lower Body Bathing: Total assistance;Bed level   Upper Body Dressing : Moderate assistance;Bed level   Lower Body Dressing: Total assistance;Bed level   Toilet Transfer: Maximal assistance;+2 for physical assistance   Toileting- Clothing Manipulation and Hygiene: Total assistance;Bed level               Vision   Vision Assessment?: No apparent visual deficits     Perception Perception: Not tested       Praxis Praxis: Not tested       Pertinent Vitals/Pain Pain Assessment Pain Assessment: Faces Pain Score: 9  Faces Pain Scale: Hurts whole lot Pain Location: BLE legs and feet Pain Descriptors / Indicators: Aching, Discomfort, Grimacing, Guarding Pain Intervention(s): Limited activity within patient's tolerance, Monitored during session, Repositioned     Extremity/Trunk Assessment Upper Extremity Assessment Upper Extremity Assessment: Overall WFL for tasks assessed   Lower  Extremity Assessment Lower Extremity Assessment: Defer to PT evaluation   Cervical / Trunk Assessment Cervical / Trunk Assessment: Other exceptions (body habitus)   Communication Communication Communication: Impaired Factors Affecting Communication: Reduced clarity of speech (difficulties with speech due to damage voice box, speaks softly)   Cognition Arousal: Lethargic Behavior During Therapy: Flat affect Cognition: No apparent impairments                                        Cueing  General Comments   Cueing Techniques: Verbal cues;Gestural cues;Tactile cues      Exercises     Shoulder Instructions      Home Living Family/patient expects to be discharged to:: Private residence Living Arrangements: Parent (mom) Available Help at Discharge: Family;Available 24 hours/day Type of Home: Apartment Home Access: Stairs to enter Entrance Stairs-Number of Steps: 15 in front, 0 in the back   Home Layout: One level     Bathroom Shower/Tub: Chief Strategy Officer: Handicapped height Bathroom Accessibility: Yes   Home Equipment: Wheelchair - Forensic psychologist (2 wheels);BSC/3in1;Hospital bed;Shower seat;Other (comment)   Additional Comments: Recieving HH nursing care . Pt then also requires to get up a hill to be able to enter/exit the home      Prior Functioning/Environment Prior Level of Function : Needs assist             Mobility Comments: SPT to w/c ADLs Comments: washed up at bed level, rolls w/c to vehicle if it is brought around  back (0 steps) for community mobility.    OT Problem List: Decreased strength;Decreased range of motion;Decreased activity tolerance;Impaired balance (sitting and/or standing);Decreased cognition;Decreased safety awareness   OT Treatment/Interventions: Self-care/ADL training;Therapeutic exercise;Energy conservation;DME and/or AE instruction;Therapeutic activities;Patient/family education;Balance training      OT Goals(Current goals can be found in the care plan section)   Acute Rehab OT Goals Patient Stated Goal: none stated OT Goal Formulation: With patient Time For Goal Achievement: 12/28/23 Potential to Achieve Goals: Fair   OT Frequency:  Min 2X/week    Co-evaluation              AM-PAC OT "6 Clicks" Daily Activity     Outcome Measure Help from another person eating meals?: A Little Help from another person taking care of personal grooming?: A Little Help  from another person toileting, which includes using toliet, bedpan, or urinal?: Total Help from another person bathing (including washing, rinsing, drying)?: A Lot Help from another person to put on and taking off regular upper body clothing?: A Lot Help from another person to put on and taking off regular lower body clothing?: Total 6 Click Score: 12   End of Session Equipment Utilized During Treatment: Oxygen  Nurse Communication: Mobility status  Activity Tolerance: Patient tolerated treatment well Patient left: in bed;with call bell/phone within reach  OT Visit Diagnosis: Unsteadiness on feet (R26.81);Other abnormalities of gait and mobility (R26.89);Muscle weakness (generalized) (M62.81)                Time: 1610-9604 OT Time Calculation (min): 32 min Charges:  OT General Charges $OT Visit: 1 Visit OT Evaluation $OT Eval Moderate Complexity: 1 Mod OT Treatments $Therapeutic Activity: 8-22 mins  Phillip Young, OTD, OTR/L SecureChat Preferred Acute Rehab [phone]   Phillip Young 12/14/2023, 1:53 PM

## 2023-12-15 ENCOUNTER — Inpatient Hospital Stay (HOSPITAL_COMMUNITY)

## 2023-12-15 ENCOUNTER — Telehealth: Payer: Self-pay | Admitting: Internal Medicine

## 2023-12-15 HISTORY — PX: IR FLUORO GUIDE CV LINE RIGHT: IMG2283

## 2023-12-15 HISTORY — PX: IR US GUIDE VASC ACCESS RIGHT: IMG2390

## 2023-12-15 LAB — RENAL FUNCTION PANEL
Albumin: 2.8 g/dL — ABNORMAL LOW (ref 3.5–5.0)
Anion gap: 17 — ABNORMAL HIGH (ref 5–15)
BUN: 46 mg/dL — ABNORMAL HIGH (ref 6–20)
CO2: 21 mmol/L — ABNORMAL LOW (ref 22–32)
Calcium: 9.5 mg/dL (ref 8.9–10.3)
Chloride: 93 mmol/L — ABNORMAL LOW (ref 98–111)
Creatinine, Ser: 6.78 mg/dL — ABNORMAL HIGH (ref 0.61–1.24)
GFR, Estimated: 10 mL/min — ABNORMAL LOW (ref >60)
Glucose, Bld: 85 mg/dL (ref 70–99)
Phosphorus: 5.6 mg/dL — ABNORMAL HIGH (ref 2.5–4.6)
Potassium: 5.8 mmol/L — ABNORMAL HIGH (ref 3.5–5.1)
Sodium: 131 mmol/L — ABNORMAL LOW (ref 135–145)

## 2023-12-15 LAB — GLUCOSE, CAPILLARY
Glucose-Capillary: 111 mg/dL — ABNORMAL HIGH (ref 70–99)
Glucose-Capillary: 96 mg/dL (ref 70–99)
Glucose-Capillary: 107 mg/dL — ABNORMAL HIGH (ref 70–99)
Glucose-Capillary: 105 mg/dL — ABNORMAL HIGH (ref 70–99)

## 2023-12-15 LAB — BLOOD CULTURE ID PANEL (REFLEXED) - BCID2
A.calcoaceticus-baumannii: NOT DETECTED
Bacteroides fragilis: NOT DETECTED
Candida albicans: NOT DETECTED
Candida auris: NOT DETECTED
Candida glabrata: NOT DETECTED
Candida krusei: NOT DETECTED
Candida parapsilosis: DETECTED — AB
Candida tropicalis: DETECTED — AB
Cryptococcus neoformans/gattii: NOT DETECTED
Enterobacter cloacae complex: NOT DETECTED
Enterobacterales: NOT DETECTED
Enterococcus Faecium: NOT DETECTED
Enterococcus faecalis: NOT DETECTED
Escherichia coli: NOT DETECTED
Haemophilus influenzae: NOT DETECTED
Klebsiella aerogenes: NOT DETECTED
Klebsiella oxytoca: NOT DETECTED
Klebsiella pneumoniae: NOT DETECTED
Listeria monocytogenes: NOT DETECTED
Neisseria meningitidis: NOT DETECTED
Proteus species: NOT DETECTED
Pseudomonas aeruginosa: NOT DETECTED
Salmonella species: NOT DETECTED
Serratia marcescens: NOT DETECTED
Staphylococcus aureus (BCID): NOT DETECTED
Staphylococcus epidermidis: NOT DETECTED
Staphylococcus lugdunensis: NOT DETECTED
Staphylococcus species: NOT DETECTED
Stenotrophomonas maltophilia: NOT DETECTED
Streptococcus agalactiae: NOT DETECTED
Streptococcus pneumoniae: NOT DETECTED
Streptococcus pyogenes: NOT DETECTED
Streptococcus species: NOT DETECTED

## 2023-12-15 LAB — CULTURE, BLOOD (ROUTINE X 2)
Culture: NO GROWTH
Culture: NO GROWTH

## 2023-12-15 LAB — CBC
HCT: 33.2 % — ABNORMAL LOW (ref 39.0–52.0)
Hemoglobin: 10.2 g/dL — ABNORMAL LOW (ref 13.0–17.0)
MCH: 31.3 pg (ref 26.0–34.0)
MCHC: 30.7 g/dL (ref 30.0–36.0)
MCV: 101.8 fL — ABNORMAL HIGH (ref 80.0–100.0)
Platelets: 260 10*3/uL (ref 150–400)
RBC: 3.26 MIL/uL — ABNORMAL LOW (ref 4.22–5.81)
RDW: 17.3 % — ABNORMAL HIGH (ref 11.5–15.5)
WBC: 8.1 10*3/uL (ref 4.0–10.5)
nRBC: 0 % (ref 0.0–0.2)

## 2023-12-15 LAB — MAGNESIUM: Magnesium: 2.4 mg/dL (ref 1.7–2.4)

## 2023-12-15 MED ORDER — VANCOMYCIN HCL IN DEXTROSE 1-5 GM/200ML-% IV SOLN
INTRAVENOUS | Status: AC
  Filled 2023-12-15: qty 200

## 2023-12-15 MED ORDER — FENTANYL 12 MCG/HR TD PT72: 1.0000 | MEDICATED_PATCH | TRANSDERMAL | 0 refills | Status: DC

## 2023-12-15 MED ORDER — VANCOMYCIN HCL 500 MG IV SOLR: 1000.0000 mg | INTRAVENOUS | Status: AC

## 2023-12-15 MED ORDER — LIDOCAINE-EPINEPHRINE 1 %-1:100000 IJ SOLN: 20.0000 mL | Freq: Once | INTRAMUSCULAR | Status: DC

## 2023-12-15 MED ORDER — POLYETHYLENE GLYCOL 3350 17 G PO PACK: 17.0000 g | PACK | Freq: Every day | ORAL | Status: DC

## 2023-12-15 MED ORDER — ZINC SULFATE 220 (50 ZN) MG PO CAPS: 220.0000 mg | ORAL_CAPSULE | Freq: Every day | ORAL | Status: DC

## 2023-12-15 MED ORDER — OXYCODONE HCL 10 MG PO TABS: 10.0000 mg | ORAL_TABLET | Freq: Four times a day (QID) | ORAL | 0 refills | Status: DC | PRN

## 2023-12-15 MED ORDER — MIDAZOLAM HCL 2 MG/2ML IJ SOLN
INTRAMUSCULAR | Status: AC
  Filled 2023-12-15: qty 2

## 2023-12-15 MED ORDER — PREGABALIN 50 MG PO CAPS: 50.0000 mg | ORAL_CAPSULE | Freq: Every day | ORAL | 1 refills | Status: DC

## 2023-12-15 MED ORDER — HEPARIN SODIUM (PORCINE) 1000 UNIT/ML IJ SOLN
INTRAMUSCULAR | Status: AC
  Filled 2023-12-15: qty 10

## 2023-12-15 MED ORDER — VANCOMYCIN HCL IN DEXTROSE 1-5 GM/200ML-% IV SOLN
INTRAVENOUS | Status: AC | PRN
  Administered 2023-12-15: 1000 mg via INTRAVENOUS

## 2023-12-15 MED ORDER — MIDAZOLAM HCL 2 MG/2ML IJ SOLN
INTRAMUSCULAR | Status: AC | PRN
  Administered 2023-12-15: 1 mg via INTRAVENOUS
  Administered 2023-12-15: .5 mg via INTRAVENOUS

## 2023-12-15 MED ORDER — LIDOCAINE-EPINEPHRINE 1 %-1:100000 IJ SOLN
INTRAMUSCULAR | Status: AC
  Filled 2023-12-15: qty 1

## 2023-12-15 MED ORDER — FENTANYL CITRATE (PF) 100 MCG/2ML IJ SOLN
INTRAMUSCULAR | Status: AC
  Filled 2023-12-15: qty 2

## 2023-12-15 MED ORDER — FENTANYL CITRATE (PF) 100 MCG/2ML IJ SOLN
INTRAMUSCULAR | Status: AC | PRN
  Administered 2023-12-15 (×2): 25 ug via INTRAVENOUS

## 2023-12-15 MED ORDER — SENNA 8.6 MG PO TABS: 1.0000 | ORAL_TABLET | Freq: Every day | ORAL | Status: DC

## 2023-12-15 MED ORDER — ANTICOAGULANT SODIUM CITRATE 4% (200MG/5ML) IV SOLN
10.0000 mL | Freq: Once | Status: AC
  Administered 2023-12-15: 5 mL
  Filled 2023-12-15 (×2): qty 10

## 2023-12-15 MED ORDER — CAMPHOR-MENTHOL 0.5-0.5 % EX LOTN: TOPICAL_LOTION | CUTANEOUS | Status: DC | PRN

## 2023-12-15 NOTE — Progress Notes (Signed)
 PT Cancellation Note  Patient Details Name: Phillip Young MRN: [id number] DOB: [birth year]   Cancelled Treatment:    Reason Eval/Treat Not Completed: (P) Patient at procedure or test/unavailable (pt at IR dept for new HD catheter placement.) Will continue efforts per PT plan of care as schedule permits.   Phillip Young 12/15/2023, 9:15 AM

## 2023-12-15 NOTE — Plan of Care (Signed)
  Problem: Metabolic: Goal: Ability to maintain appropriate glucose levels will improve Outcome: Progressing   Problem: Skin Integrity: Goal: Risk for impaired skin integrity will decrease Outcome: Progressing   Problem: Nutrition: Goal: Adequate nutrition will be maintained Outcome: Progressing

## 2023-12-15 NOTE — Procedures (Signed)
 Interventional Radiology Procedure Note  Procedure: Tunneled hemodialysis catheter placement   Findings: Please refer to procedural dictation for full description. 28 cm right internal jugular vein.  Complications: None immediate  Estimated Blood Loss:  < 5 mL  Recommendations: Catheter ready for immediate use.   Creasie Doctor, MD

## 2023-12-15 NOTE — Discharge Summary (Signed)
 Name: Phillip Young MRN: [id number] DOB: October 18, 1985 38 y.o. PCP: Carleen Chary, DO  Date of Admission: 12/06/2023  2:06 AM Date of Discharge:  12/15/2023 Attending Physician: Dr. Barkley Boot  DISCHARGE DIAGNOSIS:  Primary Problem: Shock [facility])   Hospital Problems: Principal Problem:   Shock [facility]) Active Problems:   Chronic respiratory failure with hypoxia ([name])   ESRD on dialysis ([name])   Hyperkalemia   HFrEF (heart failure with reduced ejection fraction) ([name])   Atrial fibrillation with RVR ([name])   Bacteremia associated with intravascular line ([name])   Staphylococcus epidermidis sepsis ([name])   Anasarca associated with disorder of kidney    DISCHARGE MEDICATIONS:   Allergies as of 12/15/2023       Reactions   Amiodarone  Other (See Comments)   Suspicion for amiodarone  lung/hepatotoxicity   Coreg  [carvedilol ] Shortness Of Breath, Diarrhea   Wheezing    Heparin  Other (See Comments)   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   camphor-menthol  lotion Commonly known as: SARNA Apply topically as needed for itching.   cinacalcet  30 MG tablet Commonly known as: SENSIPAR  Take 1 tablet (30 mg total) by mouth every dialysis.   clindamycin  1 % gel Commonly known as: CLINDAGEL  Apply topically 2 (two) times daily. What changed:  how much to take when to take this   Darbepoetin Alfa  150 MCG/0.3ML Sosy  injection Commonly known as: ARANESP  To be given during dialysis by [facility] per Nephrology. Not to be given to patient at SNF.   digoxin  0.125 MG tablet Commonly known as: LANOXIN  Take 1 tablet (0.125 mg total) by mouth 3 (three) times a week. Monday, Wednesday, Friday (dialysis days)   fentaNYL  12 MCG/HR Commonly known as: DURAGESIC  Place 1 patch onto the skin every 3 (three) days.   levothyroxine  25 MCG tablet Commonly known as: SYNTHROID  Take 1 tablet (25 mcg total) by mouth daily at 6 (six) AM.   midodrine  10 MG tablet Commonly known as: PROAMATINE  Take 3 tablets (30 mg total) by mouth 3 (three) times daily with meals.   naloxone 2 MG/2ML injection Commonly known as: NARCAN Inject 2 mg into the skin as needed.   Oxycodone  HCl 10 MG Tabs Take 1 tablet (10 mg total) by mouth every 6 (six) hours as needed. What changed:  when to take this reasons to take this   polyethylene glycol 17 g packet Commonly known as: MIRALAX  / GLYCOLAX  Take 17 g by mouth daily. Start taking on: Dec 16, 2023   pregabalin  50 MG capsule Commonly known as: LYRICA  Take 1 capsule (50 mg total) by mouth daily. (Take 1 capsule (50 mg total) by mouth daily. Take one capsule daily.)   senna 8.6 MG Tabs tablet Commonly known as: SENOKOT Take 1 tablet (8.6 mg total) by mouth daily. Start taking on: Dec 16, 2023   sevelamer  carbonate 800 MG tablet Commonly known as: RENVELA  Take 2 tablets (1,600 mg total) by mouth 3 (three) times daily with meals. What changed: how much to take   vancomycin  1,000 mg in sodium chloride  0.9 % 100 mL Inject 1,000 mg into the vein every Monday, Wednesday, and Friday with hemodialysis. Stop date: 01/23/24 Start taking on: Dec 16, 2023   venlafaxine  XR 37.5 MG 24 hr capsule Commonly known as: Effexor  XR Take 1 capsule (37.5 mg total) by mouth daily.   vitamin D3 25 MCG tablet Commonly known as: CHOLECALCIFEROL  Take 0.5 tablets (500 Units total) by  mouth daily.   zinc  sulfate (50mg  elemental zinc ) 220 (50 Zn) MG capsule Take 1 capsule (220 mg total) by mouth daily. Start taking on: Dec 16, 2023               Discharge Care Instructions  (From admission, onward)           Start     Ordered   12/15/23 0000  Discharge wound care:       Comments: Wound care  Every shift   12/15/23 1347            DISPOSITION AND FOLLOW-UP:  Phillip Young was discharged from [facility] in [place] condition. At the hospital follow up visit please address:    Follow-up Appointments:  Follow-up Information     Ernie Heal, Jerelyn Money, MD. Go on 12/27/2023.   Specialty: Infectious Diseases Why: 10:45 AM appointment Contact information: [address] [place] [address] [phone]         Carleen Chary, DO. Call.   Specialty: Internal Medicine Why: call to make hospital follow up appointment Contact information: [address] [place] [address] [phone]                 HOSPITAL COURSE:  Patient Summary:  Patient summary: 38 yo chronically ill male with PMH significant biventricular HF (EF < 20%), chronic Afib on Eliquis , chronic hypotension on midodrine  30mg  TID, ESRD on MWF HD, chronic hypoxic/hypercarbic respiratory failure on 2-4L [place] and NIV, severe morbid obesity, and chronic pain presented with SOB, continued increased LE swelling with pain, and weeping from left foot. Found to be hypotensive requiring levophed  support and NIV for WOB in ER. Admitted to ICU for treatment of recurrent MRSE bacteremia and management of comorbidities.  MRSE bacteremia POA, presumed endocarditis due to inabiltiy to tolerate TEE, septic shock Recurrent after being non-compliant with antibiotic regimen during last admission. TDC was removed 5/20 and replaced 5/22. Repeat BC on 5/20 with NGTD. ID followed and he was treated with Vancomycin  with recommendations for continued treatment on HD days  through 6/30.     Acute on chronic hypoxemic and hypercapnic respiratory failure, OHS/OSA Acutely Decompensated HFrEF EF 20% ESRD with volume overload Hyponatremia Hyperkalemia Incidental covid positive Atrial Fibrillation Initially required Levophed  on admission but overall BP's stabilized. Nephrology followed, he continued inpatient HD MWF with a few additional sessions given anasarca. Was incidentally found COVID positive but was largely asymptomatic. Labs remained stable during admission. He continued on home Digoxin , Eliquis , and Midodrine .  Chronic Pain Continued home Lyrica , Effexor , and Oxycodone . Was given flexeril  prn.  Hypothyroidism Continued home Synthroid    DISCHARGE INSTRUCTIONS:   Discharge Instructions     Call MD for:  difficulty breathing, headache or visual disturbances   Complete by: As directed    Call MD for:  extreme fatigue  Complete by: As directed    Call MD for:  hives   Complete by: As directed    Call MD for:  persistant dizziness or light-headedness   Complete by: As directed    Call MD for:  persistant nausea and vomiting   Complete by: As directed    Call MD for:  redness, tenderness, or signs of infection (pain, swelling, redness, odor or green/yellow discharge around incision site)   Complete by: As directed    Call MD for:  severe uncontrolled pain   Complete by: As directed    Call MD for:  temperature >100.4   Complete by: As directed    Diet - low sodium heart healthy   Complete by: As directed    Discharge instructions   Complete by: As directed    You were admitted to the hospital for a bloodstream infection and management of your other conditions.  Thankfully you are stable for discharge to [facility] for continued management.  It is incredibly important to continue getting your vancomycin  with dialysis through June 30th in order to treat this infection. All the best to you.   Discharge wound care:   Complete by: As directed    Wound  care  Every shift   Increase activity slowly   Complete by: As directed        SUBJECTIVE:  Feels okay. Comfortable with discharge after HD. Discharge Vitals:   BP (!) 94/57   Pulse 100   Temp 98.8 F (37.1 C)   Resp 18   Ht 6' (1.829 m)   Wt (!) 172 kg   SpO2 97%   BMI 51.43 kg/m   OBJECTIVE:  Constitutional: morbidly obese, chronically ill-appearing, NAD Pulmonary/Chest: normal work of breathing on 11 L HFNC Skin: TDC site without active draining/bleeding/TTP  Pertinent Labs, Studies, and Procedures:     Latest Ref Rng & Units 12/15/2023    3:21 AM 12/14/2023    4:18 AM 12/13/2023    4:47 AM  CBC  WBC 4.0 - 10.5 K/uL 8.1  8.0  8.6   Hemoglobin 13.0 - 17.0 g/dL 16.1  09.6  04.5   Hematocrit 39.0 - 52.0 % 33.2  36.9  34.8   Platelets 150 - 400 K/uL 260  250  244        Latest Ref Rng & Units 12/15/2023    3:21 AM 12/14/2023    4:18 AM 12/13/2023    4:47 AM  CMP  Glucose 70 - 99 mg/dL 85  86  409   BUN 6 - 20 mg/dL 46  34  40   Creatinine 0.61 - 1.24 mg/dL 8.11  9.14  7.82   Sodium 135 - 145 mmol/L 131  133  128   Potassium 3.5 - 5.1 mmol/L 5.8  4.7  4.9   Chloride 98 - 111 mmol/L 93  92  91   CO2 22 - 32 mmol/L 21  24  24    Calcium  8.9 - 10.3 mg/dL 9.5  9.7  9.4     ECHOCARDIOGRAM LIMITED Result Date: 12/06/2023    ECHOCARDIOGRAM LIMITED REPORT   Patient Name:   Phillip Young Date of Exam: 12/06/2023 Medical Rec #:  [id number]      Height:       72.0 in Accession #:    [id number]     Weight:       400.0 lb Date of Birth:  05-16-1986       BSA:  2.860 m Patient Age:    37 years       BP:           118/74 mmHg Patient Gender: M              HR:           133 bpm. Exam Location:  Inpatient Procedure: Color Doppler, Cardiac Doppler and Limited Echo (Both Spectral and            Color Flow Doppler were utilized during procedure). Indications:    CHF Acute Systolic I50.21  History:        Patient has prior history of Echocardiogram examinations, most                  recent 11/05/2023.  Sonographer:    Hersey Lorenzo RDCS Referring Phys: [id number] JESSICA MARSHALL IMPRESSIONS  1. Left ventricular ejection fraction, by estimation, is <20%. The left ventricle has severely decreased function. The left ventricle demonstrates global hypokinesis. The left ventricular internal cavity size was severely dilated. Left ventricular diastolic parameters are indeterminate. There is the interventricular septum is flattened in diastole ('D' shaped left ventricle), consistent with right ventricular volume overload.  2. Right ventricular systolic function is severely reduced. The right ventricular size is severely enlarged. There is moderately elevated pulmonary artery systolic pressure. The estimated right ventricular systolic pressure is 45.9 mmHg.  3. A small pericardial effusion is present. The pericardial effusion is posterior to the left ventricle.  4. Trivial mitral valve regurgitation.  5. Tricuspid valve regurgitation is moderate.  6. The aortic valve is grossly normal. Aortic valve regurgitation is not visualized. No aortic stenosis is present.  7. The inferior vena cava is dilated in size with <50% respiratory variability, suggesting right atrial pressure of 15 mmHg. Comparison(s): Prior images reviewed side by side. Mitral and tricuspid regurgitation have improved. FINDINGS  Left Ventricle: Left ventricular ejection fraction, by estimation, is <20%. The left ventricle has severely decreased function. The left ventricle demonstrates global hypokinesis. The left ventricular internal cavity size was severely dilated. There is no left ventricular hypertrophy. The interventricular septum is flattened in diastole ('D' shaped left ventricle), consistent with right ventricular volume overload. Left ventricular diastolic parameters are indeterminate. Right Ventricle: The right ventricular size is severely enlarged. Right ventricular systolic function is severely reduced. There is moderately  elevated pulmonary artery systolic pressure. The tricuspid regurgitant velocity is 2.78 m/s, and with an assumed right atrial pressure of 15 mmHg, the estimated right ventricular systolic pressure is 45.9 mmHg. Pericardium: A small pericardial effusion is present. The pericardial effusion is posterior to the left ventricle. Mitral Valve: Trivial mitral valve regurgitation. Tricuspid Valve: The tricuspid valve is normal in structure. Tricuspid valve regurgitation is moderate . No evidence of tricuspid stenosis. Aortic Valve: The aortic valve is grossly normal. Aortic valve regurgitation is not visualized. No aortic stenosis is present. Venous: The inferior vena cava is dilated in size with less than 50% respiratory variability, suggesting right atrial pressure of 15 mmHg. LEFT VENTRICLE PLAX 2D LVIDd:         7.40 cm LVIDs:         6.10 cm LV PW:         1.00 cm LV IVS:        0.90 cm LVOT diam:     2.40 cm LVOT Area:     4.52 cm   AORTA Ao Root diam: 2.90 cm TRICUSPID VALVE TR Peak grad:   30.9 mmHg  TR Vmax:        278.00 cm/s  SHUNTS Systemic Diam: 2.40 cm Gloriann Larger MD Electronically signed by Gloriann Larger MD Signature Date/Time: 12/06/2023/9:35:08 AM    Final    DG Chest Portable 1 View Result Date: 12/06/2023 CLINICAL DATA:  Shortness of breath and respiratory distress EXAM: PORTABLE CHEST 1 VIEW COMPARISON:  11/01/2018 FINDINGS: Cardiac shadow is enlarged. Dialysis catheter is again noted. Lungs are clear. No bony abnormality is noted. IMPRESSION: Stable cardiomegaly.  No acute abnormality noted. Electronically Signed   By: Violeta Grey M.D.   On: 12/06/2023 02:31     Signed: Ronni Colace, DO  Internal Medicine Resident, PGY-1 [facility]  Pager: [phone] 1:50 PM, 12/15/2023

## 2023-12-15 NOTE — TOC Transition Note (Signed)
 Transition of Care [facility]) - Discharge Note   Patient Details  Name: Phillip Young MRN: [id number] Date of Birth: Oct 04, 1985  Transition of Care [facility]) CM/SW Contact:  Tom-Johnson, Angelique Ken, RN Phone Number: 12/15/2023, 2:10 PM   Clinical Narrative:     Patient is scheduled for discharge to [facility] today. Patient underwent Tunneled Hemodialysis Catheter placement today 12/15/23 by IR and received inpatient HD prior discharge.  [facility] scheduled to transport patient at discharge. CM notified patient's mother, [name] about discharge and she is in agreement.  No further TOC needs noted.   Final next level of care: Long Term Acute Care (LTAC) [facility]) Barriers to Discharge: Barriers Resolved   Patient Goals and CMS Choice Patient states their goals for this hospitalization and ongoing recovery are:: To go to [facility] and return home after improvement. CMS Medicare.gov Compare Post Acute Care list provided to:: Patient Choice offered to / list presented to : Patient, Parent (Mother, [name])      Discharge Placement                Patient to be transferred to facility by: [facility]      Discharge Plan and Services Additional resources added to the After Visit Summary for                  DME Arranged: N/A DME Agency: NA       HH Arranged: NA HH Agency: NA        Social Drivers of Health (SDOH) Interventions SDOH Screenings   Food Insecurity: No Food Insecurity (12/06/2023)  Recent Concern: Food Insecurity - Food Insecurity Present (11/01/2023)  Housing: High Risk (12/06/2023)  Transportation Needs: Unmet Transportation Needs (12/06/2023)  Utilities: At Risk (12/06/2023)  Alcohol  Screen: Low Risk  (10/06/2023)  Depression (PHQ2-9): High Risk (10/06/2023)  Financial Resource Strain: Low Risk  (10/06/2023)  Physical Activity: Insufficiently Active (10/06/2023)  Social Connections: Socially Isolated (10/06/2023)  Stress: No Stress Concern Present  (10/06/2023)  Tobacco Use: Medium Risk (12/14/2023)  Health Literacy: Adequate Health Literacy (10/06/2023)     Readmission Risk Interventions    12/15/2023    2:07 PM 10/13/2023   11:27 AM 09/23/2023    5:05 PM  Readmission Risk Prevention Plan  Transportation Screening Complete Complete Complete  Medication Review (RN Care Manager) Referral to Pharmacy Referral to Pharmacy Referral to Pharmacy  PCP or Specialist appointment within 3-5 days of discharge Complete  Complete  HRI or Home Care Consult Complete Complete Complete  SW Recovery Care/Counseling Consult Complete Complete Complete  Palliative Care Screening Not Applicable Not Applicable Not Applicable  Skilled Nursing Facility Not Applicable Not Applicable Not Applicable

## 2023-12-15 NOTE — Progress Notes (Signed)
 Delay in IR procedure start time due to no acceptable IV access upon arrival to IR. PIV in place removed. US  PIV placed by this RN after 2x attempts in IR bay. Pt. Into IR room 1 with acceptable PIV with blood return and confirmation via US . With RN NS flush, pt. Complained of burning and refused use of new PIV. Pt. Requested new PIV placement and refused leaving IR room 1 at this time - unsuccessful by 2x RN in IR. Reflush of US  guided PIV with blood return and no pain with gentle flush. Pt. Agreeable to use US  PIV by this RN. IR procedural antibiotics started by Shelda Destine, RN in IR. Dr. Jinx Mourning notified of delay in start time due to pt. PIV concerns and refusal to leave IR Room 1 to address said issue.

## 2023-12-15 NOTE — Telephone Encounter (Addendum)
 Mr. Radabaugh is a 38 year old with PMH MRSE bactermia 2/2 to HD line c/p presumed endocarditis, ESRD on HD, afib on DOAC, HFrEF EF20%, hypothyroidism.  He underwent line exchange 5/22 then was discharged 5/22 in stable condition to [facility].  Paged by micro lab as 1/4 culture + with candida parapsilosis and tropicalis.  He will need to be re-admitted for treatment of fungemia. I called for direct admission to med tele and called and updated [facility] hospital [facility] RN) as well.

## 2023-12-15 NOTE — Progress Notes (Signed)
 If brief note  Abx to be given tiw with dialysis. Not via PICC. No pic needed

## 2023-12-15 NOTE — Progress Notes (Signed)
 [facility] Progress Note  Subjective:  Last HD on 5/20 with 5.5 kg removed.  IR removed his tunneled catheter after that for a line holiday.  Has been NPO for tunn catheter replacement.  Going to [facility] after HD today.  Answered his questions.  He has been on 7 liters oxygen  this AM.  Had a good night per RN.    Review of systems:      Reports shortness of breath and cough No chest pain Still with Leg discomfort - hard to get comfortable  Denies n/v    Vitals:   12/14/23 2342 12/15/23 0000 12/15/23 0100 12/15/23 0400  BP:  107/83    Pulse:  (!) 135 (!) 133   Resp:  16 (!) 23   Temp: 98.5 F (36.9 C)   98.3 F (36.8 C)  TempSrc: Oral   Oral  SpO2:  100% 94%   Weight:      Height:        Physical Exam:      General adult male in bed in no acute distress HEENT normocephalic atraumatic extraocular movements intact sclera anicteric Neck supple trachea midline Lungs reduced breath sounds; on 7 liters oxygen  per nasal cannula  Heart tachycardic; S1S2 Abdomen obese habitus; soft nontender nondistended Extremities 2+ edema bilateral lower extremities  Psych normal mood and affect Neuro alert and oriented x 3 provides hx and follows commands  Access bandage over old RIJ catheter site         Renal-related home meds: Sensipar  30 mg MWF Midodrine  30 mg 3 times daily Renvela  2 AC 3 times daily      OP HD: MWF [facility] 4.5h  B400   180kg   TDC   NO heparin  (HIT)       Assessment/ Plan: MRSE bacteremia: getting line holiday.  IR was  consulted and catheter was removed after HD on 5/20.   I have requested replacement of tunneled catheter on 5/22 with IR.  NPO for same. I have paused eliquis  5 mg BID in anticipation new line ID is planning another 6 weeks of Vanc 1 gm with HD for this patient ending June 30th.  He is going to [facility].  Resume eliquis  after line in place  Acute on chronic hypoxic / hypercarbic resp failure. Admit CXR w/ some IS pulm edema. Started  CRRT 5/13, but CRRT was not efficient per charting so he was transitioned back to iHD on 5/16.   Optimize volume with HD.   Covid 19 PNA - therapies - per primary team and Optimize volume status with HD Chronic vol overload - optimize volume status with HD and fluid restrictions with patient is key, as well.  Discussed with patient  ESRD:  on HD per MWF schedule usually.   Had extra HD on Tues, 5/20 off schedule.  Will plan for next tx off schedule on 5/22 then resume MWF schedule for a treatment on 5/23 if transferred to [facility] Chronic hypotension: takes 30mg  tid of midodrine  at home, cont here Hyperkalemia - lokelma  once daily for now given line holiday.  Low K diet restriction  Anemia of esrd: Hb acceptable; defer ESA for now Secondary hyperparathyroidism: hyperphos on renvela  at home - resumed Severe biventricular CHF: last echo 12/06/23 w/ EF < 20% and RV function severely reduced Atrial fibrillation - agents per primary team Hyponatremia - optimize volume and clearance with HD   Disposition - RN states orders for transfer to progressive bed are in place  Recent Labs  Lab 12/14/23 0418 12/15/23 0321  HGB 11.4* 10.2*  ALBUMIN  3.0* 2.8*  CALCIUM  9.7 9.5  PHOS 5.2* 5.6*  CREATININE 5.77* 6.78*  K 4.7 5.8*   No results for input(s): "IRON", "TIBC", "FERRITIN" in the last 168 hours. Inpatient medications:  ascorbic acid  250 mg Oral Daily   Chlorhexidine  Gluconate Cloth  6 each Topical Q0600   digoxin   0.125 mg Oral Once per day on Monday Wednesday Friday   feeding supplement  237 mL Oral BID BM   fentaNYL   1 patch Transdermal Q72H   levothyroxine   25 mcg Oral Q0600   midodrine   30 mg Oral TID WC   multivitamin  1 tablet Oral QHS   polyethylene glycol  17 g Oral Daily   pregabalin   50 mg Oral Daily   senna  1 tablet Oral Daily   sevelamer  carbonate  1,600 mg Oral TID WC   sodium zirconium cyclosilicate   10 g Oral Daily   venlafaxine  XR  37.5 mg Oral Daily   zinc  sulfate  (50mg  elemental zinc )  220 mg Oral Daily    anticoagulant sodium citrate      anticoagulant sodium citrate      vancomycin      alteplase , anticoagulant sodium citrate , anticoagulant sodium citrate , camphor-menthol , cyclobenzaprine , docusate sodium , feeding supplement (NEPRO CARB STEADY), feeding supplement (NEPRO CARB STEADY), hydrOXYzine , lidocaine  (PF), lidocaine  (PF), lidocaine  (PF), lidocaine -prilocaine , lidocaine -prilocaine , lidocaine -prilocaine , ondansetron  (ZOFRAN ) IV, mouth rinse, oxyCODONE , pentafluoroprop-tetrafluoroeth, pentafluoroprop-tetrafluoroeth, pentafluoroprop-tetrafluoroeth     Svea Pusch C Elisa Sorlie, MD 6:12 AM 12/15/2023

## 2023-12-15 NOTE — Progress Notes (Signed)
   12/15/23 1653  Vitals  Temp 98.4 F (36.9 C)  Pulse Rate (!) 118  Resp (!) 22  BP 97/70  SpO2 100 %  O2 Device HFNC  Oxygen  Therapy  O2 Flow Rate (L/min) 7 L/min  Patient Activity (if Appropriate) In bed  Pulse Oximetry Type Continuous  Oximetry Probe Site Changed No  Post Treatment  Dialyzer Clearance Lightly streaked  Hemodialysis Intake (mL) 0 mL  Liters Processed 84  Fluid Removed (mL) 5500 mL  Tolerated HD Treatment Yes  Oxygen  Therapy/Pulse Ox  $ Heated High Flow Nasal Cannula  Yes    Alert and oriented.  Informed consent signed and in chart.   TX duration: Three hours and thirty minutes  Patient tolerated well.   Alert, without acute distress.  Hand-off given to patient's nurse.   Access used: Right HD Catheter Access issues: None

## 2023-12-16 ENCOUNTER — Observation Stay: Admit: 2023-12-16

## 2023-12-16 ENCOUNTER — Encounter (HOSPITAL_COMMUNITY): Payer: Self-pay

## 2023-12-16 ENCOUNTER — Telehealth: Payer: Self-pay | Admitting: Internal Medicine

## 2023-12-16 ENCOUNTER — Other Ambulatory Visit: Payer: Self-pay | Admitting: Student

## 2023-12-16 DIAGNOSIS — B49 Unspecified mycosis: Secondary | ICD-10-CM | POA: Diagnosis not present

## 2023-12-16 DIAGNOSIS — I330 Acute and subacute infective endocarditis: Secondary | ICD-10-CM | POA: Diagnosis not present

## 2023-12-16 NOTE — Progress Notes (Signed)
 Late Note Entry- Dec 16, 2023  Pt d/c to [facility] yesterday. Contacted [facility] this morning to be advised of pt's d/c disposition.   Lauraine Polite Renal Navigator [phone]

## 2023-12-16 NOTE — Telephone Encounter (Signed)
 Patient recent admission line associated mrse bsi line removed   While aseptic his repeat bcx grew 2 different species candida ?unclear if real  [facility] is at [facility]  Discussed with primary team he'll need repeat bcx and would start on micafungin preferably over fluconazole   If blood cultures negative can consider contaminant; if positive will need full evaluation/hospital admission  Discussed with medicine team who previously had patient

## 2023-12-18 ENCOUNTER — Telehealth: Payer: Self-pay | Admitting: Infectious Disease

## 2023-12-18 LAB — CULTURE, BLOOD (ROUTINE X 2): Culture: NO GROWTH

## 2023-12-18 NOTE — Telephone Encounter (Signed)
 I was notified via the [facility] system of the patient growing Candida parapsilosis from blood cultures.  My partner Dr. Hildegarde Lowe was already made aware of this when the [facility] ID identified it few days ago he had counseled for the patient to have repeat blood cultures done at [facility] and for initiation of echinocandin  I called [facility] today to check on what has transpired.  It does not appear that repeat blood cultures were taken.  The patient is indeed on an echinocandin.  I have given my cell phone to the RN taking care of the patient to pass on to the MD taking care of the patient.  Point I would recommend giving him 2 weeks of antifungal therapy and then stopping.  One could get repeat blood cultures 2 weeks after finishing echinocandin therapy  Certainly if this is a genuine candidemia and if so it will likely recur the HD catheter would need to be removed  I think at this point repeat blood cultures 2 weeks post antifungals is best maneuver to determine this.  He is currently scheduled to continue vancomycin  through 30 June.

## 2023-12-19 DIAGNOSIS — J9621 Acute and chronic respiratory failure with hypoxia: Secondary | ICD-10-CM

## 2023-12-19 DIAGNOSIS — Z930 Tracheostomy status: Secondary | ICD-10-CM

## 2023-12-19 DIAGNOSIS — R1310 Dysphagia, unspecified: Secondary | ICD-10-CM

## 2023-12-19 DIAGNOSIS — J189 Pneumonia, unspecified organism: Secondary | ICD-10-CM

## 2023-12-20 ENCOUNTER — Encounter: Admitting: Psychology

## 2023-12-20 DIAGNOSIS — J9621 Acute and chronic respiratory failure with hypoxia: Secondary | ICD-10-CM

## 2023-12-20 DIAGNOSIS — R1310 Dysphagia, unspecified: Secondary | ICD-10-CM

## 2023-12-20 DIAGNOSIS — Z930 Tracheostomy status: Secondary | ICD-10-CM

## 2023-12-20 DIAGNOSIS — J189 Pneumonia, unspecified organism: Secondary | ICD-10-CM

## 2023-12-21 ENCOUNTER — Ambulatory Visit: Payer: Self-pay | Admitting: Internal Medicine

## 2023-12-21 ENCOUNTER — Telehealth: Payer: Self-pay | Admitting: *Deleted

## 2023-12-21 DIAGNOSIS — J189 Pneumonia, unspecified organism: Secondary | ICD-10-CM

## 2023-12-21 DIAGNOSIS — Z930 Tracheostomy status: Secondary | ICD-10-CM

## 2023-12-21 DIAGNOSIS — R1310 Dysphagia, unspecified: Secondary | ICD-10-CM

## 2023-12-21 DIAGNOSIS — J9621 Acute and chronic respiratory failure with hypoxia: Secondary | ICD-10-CM

## 2023-12-21 NOTE — Telephone Encounter (Signed)
 Call from patient presently at [facility].  Call requesting a transfer back to [facility].  Informed patient that he will need to speak with the doctors and the nurses there about his concerns and his request for transfer. Spoke to [place] , RN stated have had conversations with the patient and his family.  Patient is refusing care .  Informed Ivette Marks that the doctors here will not be able to do orders for the patient as he is not in a [employer].  Ivette Marks stated she is aware and has also informed patient along with the Supervisor.

## 2023-12-22 ENCOUNTER — Encounter: Admitting: Psychology

## 2023-12-22 DIAGNOSIS — J189 Pneumonia, unspecified organism: Secondary | ICD-10-CM

## 2023-12-22 DIAGNOSIS — J9621 Acute and chronic respiratory failure with hypoxia: Secondary | ICD-10-CM

## 2023-12-22 DIAGNOSIS — Z930 Tracheostomy status: Secondary | ICD-10-CM

## 2023-12-22 DIAGNOSIS — R1310 Dysphagia, unspecified: Secondary | ICD-10-CM

## 2023-12-23 DIAGNOSIS — J9621 Acute and chronic respiratory failure with hypoxia: Secondary | ICD-10-CM

## 2023-12-23 DIAGNOSIS — R1310 Dysphagia, unspecified: Secondary | ICD-10-CM

## 2023-12-23 DIAGNOSIS — J189 Pneumonia, unspecified organism: Secondary | ICD-10-CM

## 2023-12-23 DIAGNOSIS — Z930 Tracheostomy status: Secondary | ICD-10-CM

## 2023-12-24 DIAGNOSIS — J9621 Acute and chronic respiratory failure with hypoxia: Secondary | ICD-10-CM

## 2023-12-24 DIAGNOSIS — R1310 Dysphagia, unspecified: Secondary | ICD-10-CM

## 2023-12-24 DIAGNOSIS — Z930 Tracheostomy status: Secondary | ICD-10-CM

## 2023-12-24 DIAGNOSIS — J189 Pneumonia, unspecified organism: Secondary | ICD-10-CM

## 2023-12-25 DIAGNOSIS — Z930 Tracheostomy status: Secondary | ICD-10-CM

## 2023-12-25 DIAGNOSIS — J189 Pneumonia, unspecified organism: Secondary | ICD-10-CM

## 2023-12-25 DIAGNOSIS — J9621 Acute and chronic respiratory failure with hypoxia: Secondary | ICD-10-CM

## 2023-12-25 DIAGNOSIS — R1310 Dysphagia, unspecified: Secondary | ICD-10-CM

## 2023-12-25 LAB — CULTURE, BLOOD (ROUTINE X 2): Special Requests: ADEQUATE

## 2023-12-26 ENCOUNTER — Encounter: Payer: Self-pay | Admitting: Infectious Disease

## 2023-12-26 DIAGNOSIS — B379 Candidiasis, unspecified: Secondary | ICD-10-CM

## 2023-12-26 HISTORY — DX: Candidiasis, unspecified: B37.9

## 2023-12-26 NOTE — Telephone Encounter (Signed)
 Pt was prev sch and cancelled due to his dialysis. Pt is now sch again for the following in order to get the form completed if [employer] calls back.  Please relay the message again as they have been called and are aware he will need to keep an appointment before his from can ben completed.  Please also see prev message as of 12/21/2023  the patient is still inpatient with [facility].  Name: Phillip Young, Phillip Young MRN: [id number]  Date: 01/05/2024 Status: Sch  Time: 10:45 AM Length: 30  Visit Type: OPEN ESTABLISHED [726] Copay: $0.00  Provider: [name], MD Department: [facility]  Referring Provider: Carleen Chary CSN: [id number]  Notes: FU VISIT/resch /FORM IN THE BOX TO DISCUSS DME NEEDS/BI PAP AND O2 CONCENTRATOR 3 STAT TEST NEEDED PER [employer] TO QUALIFY THE PT TO BEEN RECERT/BEFORE THEY PICK UP HTE EQUIPMENT/PT IS AWARE/CFB     Copied from CRM #[id number]. Topic: Clinical - Order For Equipment >> Dec 22, 2023  4:42 PM Brynn Caras wrote: Reason for CRM: [employer] representative called to check the status of the order faxed on behalf of this patient previously on 11/29/2023 in regards to his O2 [employer] and Cpap Machine. She would like to confirm if this has been completed yet?  Callback #: [phone]

## 2023-12-26 NOTE — Progress Notes (Deleted)
 Subjective:  Chief Complaint: followup for E faecalis nd MRSE bacteremia with concern for possible endocarditis also with c parapsilosis isolated on 1/2 blood cultures taken post HD catheter removal   Patient ID: Phillip Young, male    DOB: 09-17-1985, 38 y.o.   MRN: [id number]  HPI   Past Medical History:  Diagnosis Date   Acute on chronic respiratory failure with hypoxia ([name]) 04/21/2021   Acute on chronic systolic (congestive) heart failure ([name]) 02/26/2020   Amiodarone  toxicity    Anemia    Atrial flutter ([name])    Biventricular congestive heart failure ([name])    Candida parapsilosis infection 12/26/2023   Chronic hypoxemic respiratory failure ([name])    Class 3 severe obesity due to excess calories with serious comorbidity and body mass index (BMI) of 50.0 to 59.9 in adult 02/26/2020   ESRD on hemodialysis [facility])    Essential hypertension 02/26/2020   GERD without esophagitis 02/26/2020   Hidradenitis suppurativa 02/26/2020   NICM (nonischemic cardiomyopathy) ([name])    Obesity hypoventilation syndrome ([name])    OSA (obstructive sleep apnea)    PAF (paroxysmal atrial fibrillation) ([name])    Pneumonia    Prediabetes 02/26/2020    Past Surgical History:  Procedure Laterality Date   ABSCESS DRAINAGE     AV FISTULA PLACEMENT Left 08/21/2021   Procedure: LEFT ARM ARTERIOVENOUS (AV) FISTULA.;  Surgeon: Margherita Shell, MD;  Location: [facility] OR;  Service: Vascular;  Laterality: Left;   CARDIAC CATHETERIZATION     CARDIOVERSION N/A 10/09/2021   Procedure: CARDIOVERSION;  Surgeon: Darlis Eisenmenger, MD;  Location: [facility] ENDOSCOPY;  Service: Cardiovascular;  Laterality: N/A;   CARDIOVERSION N/A 05/28/2022   Procedure: CARDIOVERSION;  Surgeon: Darlis Eisenmenger, MD;  Location: [facility] ENDOSCOPY;  Service: Cardiovascular;  Laterality: N/A;   CARDIOVERSION N/A 06/07/2022   Procedure: CARDIOVERSION;  Surgeon: Darlis Eisenmenger, MD;  Location: [facility] ENDOSCOPY;  Service: Cardiovascular;  Laterality: N/A;    IR FLUORO GUIDE CV LINE RIGHT  03/10/2021   IR FLUORO GUIDE CV LINE RIGHT  04/22/2021   IR FLUORO GUIDE CV LINE RIGHT  08/20/2021   IR FLUORO GUIDE CV LINE RIGHT  03/01/2023   IR FLUORO GUIDE CV LINE RIGHT  03/09/2023   IR FLUORO GUIDE CV LINE RIGHT  05/30/2023   IR FLUORO GUIDE CV LINE RIGHT  11/10/2023   IR FLUORO GUIDE CV LINE RIGHT  12/15/2023   IR PTA VENOUS EXCEPT DIALYSIS CIRCUIT  03/09/2023   IR REMOVAL TUN CV CATH W/O FL  05/26/2023   IR REMOVAL TUN CV CATH W/O FL  11/07/2023   IR REMOVAL TUN CV CATH W/O FL  12/13/2023   IR REMOVE CV FIBRIN SHEATH  03/09/2023   IR US  GUIDE VASC ACCESS RIGHT  03/10/2021   IR US  GUIDE VASC ACCESS RIGHT  04/22/2021   IR US  GUIDE VASC ACCESS RIGHT  05/30/2023   IR US  GUIDE VASC ACCESS RIGHT  11/10/2023   IR US  GUIDE VASC ACCESS RIGHT  12/15/2023   RIGHT HEART CATH N/A 03/06/2021   Procedure: RIGHT HEART CATH;  Surgeon: Mardell Shade, MD;  Location: [facility] INVASIVE CV LAB;  Service: Cardiovascular;  Laterality: N/A;   RIGHT/LEFT HEART CATH AND CORONARY ANGIOGRAPHY N/A 03/04/2020   Procedure: RIGHT/LEFT HEART CATH AND CORONARY ANGIOGRAPHY;  Surgeon: Darlis Eisenmenger, MD;  Location: [facility] INVASIVE CV LAB;  Service: Cardiovascular;  Laterality: N/A;   TEE WITHOUT CARDIOVERSION N/A 05/05/2021   Procedure: TRANSESOPHAGEAL ECHOCARDIOGRAM (TEE);  Surgeon: Darlis Eisenmenger,  MD;  Location: [facility] ENDOSCOPY;  Service: Cardiovascular;  Laterality: N/A;   TEMPORARY DIALYSIS CATHETER  03/06/2021   Procedure: TEMPORARY DIALYSIS CATHETER;  Surgeon: Mardell Shade, MD;  Location: [facility] INVASIVE CV LAB;  Service: Cardiovascular;;    Family History  Problem Relation Age of Onset   Heart disease Mother    Hypertension Mother    Pulmonary Hypertension Mother    Drug abuse Father        died due to Heroin overdose      Social History   Socioeconomic History   Marital status: Single    Spouse name: Not on file   Number of children: Not on file   Years of education: Not on  file   Highest education level: Not on file  Occupational History   Not on file  Tobacco Use   Smoking status: Former    Current packs/day: 0.00    Types: Cigarettes    Quit date: 2019    Years since quitting: 6.4    Passive exposure: Current   Smokeless tobacco: Former   Tobacco comments:    quit in 2019  Vaping Use   Vaping status: Never Used  Substance and Sexual Activity   Alcohol  use: Never   Drug use: Never   Sexual activity: Not Currently  Other Topics Concern   Not on file  Social History Narrative   Not on file   Social Drivers of Health   Financial Resource Strain: Low Risk  (10/06/2023)   Overall Financial Resource Strain (CARDIA)    Difficulty of Paying Living Expenses: Not hard at all  Food Insecurity: No Food Insecurity (12/06/2023)   Hunger Vital Sign    Worried About Running Out of Food in the Last Year: Never true    Ran Out of Food in the Last Year: Never true  Recent Concern: Food Insecurity - Food Insecurity Present (11/01/2023)   Hunger Vital Sign    Worried About Running Out of Food in the Last Year: Sometimes true    Ran Out of Food in the Last Year: Never true  Transportation Needs: Unmet Transportation Needs (12/06/2023)   PRAPARE - Transportation    Lack of Transportation (Medical): Yes    Lack of Transportation (Non-Medical): Yes  Physical Activity: Insufficiently Active (10/06/2023)   Exercise Vital Sign    Days of Exercise per Week: 7 days    Minutes of Exercise per Session: 10 min  Stress: No Stress Concern Present (10/06/2023)   [employer] of Occupational Health - Occupational Stress Questionnaire    Feeling of Stress : Not at all  Social Connections: Socially Isolated (10/06/2023)   Social Connection and Isolation Panel [NHANES]    Frequency of Communication with Friends and Family: More than three times a week    Frequency of Social Gatherings with Friends and Family: Once a week    Attends Religious Services: Never    Automotive engineer or Organizations: No    Attends Engineer, structural: Never    Marital Status: Never married    Allergies  Allergen Reactions   Amiodarone  Other (See Comments)    Suspicion for amiodarone  lung/hepatotoxicity    Coreg  [Carvedilol ] Shortness Of Breath and Diarrhea    Wheezing    Heparin  Other (See Comments)    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 (Patient taking differently: Take 30 mg by mouth every Monday, Wednesday, and Friday.), Disp: 60 tablet, Rfl: 0   clindamycin  (CLINDAGEL ) 1 % gel, Apply topically 2 (two) times daily. (Patient taking differently: Apply 1 Application topically 2 (two) times daily.), Disp: 30 g, Rfl: 0   Darbepoetin Alfa  (ARANESP ) 150 MCG/0.3ML SOSY injection, To be given during dialysis by [facility] per Nephrology. Not to be given to patient at SNF., Disp: , Rfl:    digoxin  (LANOXIN ) 0.125 MG tablet, Take 1 tablet (0.125 mg total) by mouth 3 (three) times a week. Monday, Wednesday, Friday (dialysis days), Disp: 30 tablet, Rfl: 1   fentaNYL  (DURAGESIC ) 12 MCG/HR, Place 1 patch onto the skin every 3 (three) days., Disp: 5 patch, Rfl: 0   levothyroxine  (SYNTHROID ) 25 MCG tablet, Take 1 tablet (25 mcg total) by mouth daily at 6 (six) AM., Disp: 90 tablet, Rfl: 3   midodrine  (PROAMATINE ) 10 MG tablet, Take 3 tablets (30 mg total) by mouth 3 (three) times daily with meals., Disp: 270 tablet, Rfl: 0    naloxone (NARCAN) 2 MG/2ML injection, Inject 2 mg into the skin as needed. (Patient not taking: Reported on 11/03/2023), Disp: , Rfl:    Oxycodone  HCl 10 MG TABS, Take 1 tablet (10 mg total) by mouth every 6 (six) hours as needed., Disp: 30 tablet, Rfl: 0   polyethylene glycol (MIRALAX  / GLYCOLAX ) 17 g packet, Take 17 g by mouth daily., Disp: , Rfl:    pregabalin  (LYRICA ) 50 MG capsule, Take 1 capsule (50 mg total) by mouth daily. Take one capsule daily., Disp: 90 capsule, Rfl: 1   senna (SENOKOT) 8.6 MG TABS tablet, Take 1 tablet (8.6 mg total) by mouth daily., Disp: , Rfl:    sevelamer  carbonate (RENVELA ) 800 MG tablet, Take 2 tablets (1,600 mg total) by mouth 3 (three) times daily with meals. (Patient taking differently: Take 2,400 mg by mouth 3 (three) times daily with meals.), Disp: 180 tablet, Rfl: 0   vancomycin  1,000 mg in sodium chloride  0.9 % 100 mL, Inject 1,000 mg into the vein every Monday, Wednesday, and Friday with hemodialysis. Stop date: 01/23/24, Disp: , Rfl:    venlafaxine  XR (EFFEXOR  XR) 37.5 MG 24 hr capsule, Take 1 capsule (37.5 mg total) by mouth daily., Disp: 30 capsule, Rfl: 2   vitamin D3 (CHOLECALCIFEROL ) 25 MCG tablet, Take 0.5 tablets (500 Units total) by mouth daily., Disp: 30 tablet, Rfl: 0   zinc  sulfate, 50mg  elemental zinc , 220 (50 Zn) MG capsule, Take 1 capsule (220 mg total) by mouth daily., Disp: , Rfl:    Review of Systems     Objective:   Physical Exam        Assessment & Plan:

## 2023-12-27 ENCOUNTER — Ambulatory Visit: Payer: Self-pay | Admitting: Infectious Disease

## 2023-12-27 DIAGNOSIS — B952 Enterococcus as the cause of diseases classified elsewhere: Secondary | ICD-10-CM

## 2023-12-27 DIAGNOSIS — B379 Candidiasis, unspecified: Secondary | ICD-10-CM

## 2023-12-27 DIAGNOSIS — Z992 Dependence on renal dialysis: Secondary | ICD-10-CM

## 2023-12-27 DIAGNOSIS — Z1629 Resistance to other single specified antibiotic: Secondary | ICD-10-CM

## 2023-12-27 DIAGNOSIS — T80219D Unspecified infection due to central venous catheter, subsequent encounter: Secondary | ICD-10-CM

## 2023-12-28 ENCOUNTER — Telehealth: Payer: Self-pay

## 2023-12-28 ENCOUNTER — Ambulatory Visit: Payer: Self-pay

## 2023-12-28 ENCOUNTER — Telehealth: Payer: Self-pay | Admitting: *Deleted

## 2023-12-28 ENCOUNTER — Ambulatory Visit: Admitting: Speech Pathology
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
## 2023-12-28 NOTE — Telephone Encounter (Signed)
 Patients mother called back with the patient and social worker on the line. Patients social worker stated that in order for [facility] to transfer the patient we will first need to accept him as a patient in the ED. I advised the social worker that we can not transfer the patient since we aren't the ones that admitted the patient or evaluated the patient. The patient will have to be transferred by [facility] to the ED and then the Ed will have to evaluate the patient. The ED will make the decision on if the patient needs to stay or if he needs to go home. I spoke to Dr.Williams she stated that the patient will need to go to the ED to be evaluated. Patients mother, Child psychotherapist and the patient understands and will try to get him transferred.

## 2023-12-28 NOTE — Telephone Encounter (Signed)
 I talked to pt's mother who wanted to know how to transfer her son from [facility] to [facility] facility. I informed her to talk to the pt's social worker (she stated he does have a SW); let her know of their transferred request so she can do the paperwork and see if/when a bed is available. She stated ok.
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
## 2023-12-28 NOTE — Telephone Encounter (Signed)
 Per [facility], patient paper work is still being processed. [phone].

## 2023-12-28 NOTE — Telephone Encounter (Signed)
 Thank you :)

## 2023-12-28 NOTE — Telephone Encounter (Signed)
 Copied from CRM [id number]. Topic: Clinical - Red Word Triage >> Dec 28, 2023  2:12 PM Phillip Young F wrote: Red Word that prompted transfer to Nurse Triage:   Patient is currently admitted and looking to speak with his doctor; his breath sounds labored so I wanted to get him over to the nurse >> Dec 28, 2023  4:08 PM Phillip Young wrote: Patient sister Phillip Young calling on issue for letting the patient be released but called office they stated they have spoken to patient with instructions already on what to do. Office had me tranfers them anyway to talk to to them again

## 2023-12-28 NOTE — Telephone Encounter (Signed)
 Patient is being DC'd from [facility] on 01/02/24 per patient's mother. I also spoke with Pam in regards to his discharge and IV abx. Per Pam she has spoke with the case manager at [facility] and they will be getting a tunneled CVC placed before discharge for IV abx to be administered at home.  Vancomycin  at HD MWF and Micafungin daily thru 6/30 at home.  He is scheduled to follow up with you on 01/12/24.  Aahan Marques Adel Holt, CMA

## 2023-12-28 NOTE — Telephone Encounter (Signed)
 Return call to pt's sister, Jenette Mitchell, who stated "things were being handled". Stated pt's oxygen  has been turned down. There's another person in the room who stated his name is [employer]. He stated pt's O2 level is 97% and is currently discussing a plan with pt and sister. End of conversation.

## 2023-12-29 ENCOUNTER — Telehealth: Payer: Self-pay | Admitting: Student

## 2023-12-29 NOTE — Telephone Encounter (Signed)
 Phillip Young and Phillip Young call from [place], where Phillip Young is currently receiving care. He has concerns about leg pain and wonders if he has cellulitis. He also notes weeping from Phillip swollen scrotum. He has been at [facility] since May after treatment for septic shock due to MRSE bacteremia complicated by respiratory failure due to heart failure and ESRD, among other chronic problems. After discharge blood cultures grew Candida species and he has been treated for this at [facility]. He says tomorrow they plan to discharge him home and he fears he is not ready. I encouraged him and Phillip Young to have this conversation with the healthcare providers at [facility]. I reassured him that we will continue to care for him when he leaves, be it in the clinic or on the hospital wards.  Adria Hopkins MD 12/29/2023, 10:31 PM

## 2024-01-02 ENCOUNTER — Telehealth: Payer: Self-pay | Admitting: *Deleted

## 2024-01-02 DIAGNOSIS — Z930 Tracheostomy status: Secondary | ICD-10-CM

## 2024-01-02 DIAGNOSIS — J189 Pneumonia, unspecified organism: Secondary | ICD-10-CM

## 2024-01-02 DIAGNOSIS — R1310 Dysphagia, unspecified: Secondary | ICD-10-CM

## 2024-01-02 DIAGNOSIS — J9621 Acute and chronic respiratory failure with hypoxia: Secondary | ICD-10-CM

## 2024-01-02 NOTE — Telephone Encounter (Signed)
 Return call to patient's mom.  Patient to be discharged today. Will need to go through the ER for Admission to the hospital is needed as patient has Cellulitis per his mom that is going up his leg.  Patient's mom was informed that the doctor in the ER will determine what needs to be done next.  RTC to patient's mom informed her that patient is no longer a patient of the [facility] he has been reassigned to Dr. Leia Pun patient's mom was given number of [phone] to call his office. Copied from CRM [id number]. Topic: Clinical - Medical Advice >> Jan 02, 2024  8:13 AM Bearl Botts A wrote: Reason for CRM: Milus Fritze is calling stating that the patient is still at [facility] and is getting ready to be discharged but is needing to be transported to [facility] due to his cellulitis and infection in his body. [name] states that she spoke to someone at the office last week and was told to call back when the patient was getting discharged from [facility]. Per patient his doctor is Dr. Carleen Chary.   Please call [name] back [phone]. >> Jan 02, 2024  8:49 AM Blair Bumpers wrote: Patient's mom, [name], called back stating that he's currently in [facility] hospital & is soon to be discharged. She states that patient has wounds that has opened up and he needs to be treated for that by Dr. Carleen Chary. Advised her that a previous message has already been sent to nurse and they will speak with with Dr. Juberg and give her a call back.

## 2024-01-03 ENCOUNTER — Inpatient Hospital Stay (HOSPITAL_COMMUNITY)

## 2024-01-03 ENCOUNTER — Telehealth: Payer: Self-pay | Admitting: *Deleted

## 2024-01-03 ENCOUNTER — Emergency Department (HOSPITAL_COMMUNITY)

## 2024-01-03 ENCOUNTER — Inpatient Hospital Stay (HOSPITAL_COMMUNITY)
Admission: EM | Admit: 2024-01-03 | Discharge: 2024-02-24 | DRG: 291 | Disposition: E | Attending: Pulmonary Disease | Admitting: Pulmonary Disease

## 2024-01-03 ENCOUNTER — Other Ambulatory Visit: Payer: Self-pay | Admitting: Internal Medicine

## 2024-01-03 ENCOUNTER — Other Ambulatory Visit: Payer: Self-pay

## 2024-01-03 DIAGNOSIS — G894 Chronic pain syndrome: Secondary | ICD-10-CM | POA: Diagnosis not present

## 2024-01-03 DIAGNOSIS — Z6841 Body Mass Index (BMI) 40.0 and over, adult: Secondary | ICD-10-CM

## 2024-01-03 DIAGNOSIS — I4819 Other persistent atrial fibrillation: Secondary | ICD-10-CM

## 2024-01-03 DIAGNOSIS — D631 Anemia in chronic kidney disease: Secondary | ICD-10-CM | POA: Diagnosis present

## 2024-01-03 DIAGNOSIS — R570 Cardiogenic shock: Secondary | ICD-10-CM | POA: Diagnosis present

## 2024-01-03 DIAGNOSIS — R7401 Elevation of levels of liver transaminase levels: Secondary | ICD-10-CM

## 2024-01-03 DIAGNOSIS — E162 Hypoglycemia, unspecified: Secondary | ICD-10-CM | POA: Diagnosis not present

## 2024-01-03 DIAGNOSIS — Z91158 Patient's noncompliance with renal dialysis for other reason: Secondary | ICD-10-CM

## 2024-01-03 DIAGNOSIS — I5082 Biventricular heart failure: Secondary | ICD-10-CM | POA: Diagnosis present

## 2024-01-03 DIAGNOSIS — I4821 Permanent atrial fibrillation: Secondary | ICD-10-CM | POA: Diagnosis present

## 2024-01-03 DIAGNOSIS — K219 Gastro-esophageal reflux disease without esophagitis: Secondary | ICD-10-CM | POA: Diagnosis present

## 2024-01-03 DIAGNOSIS — L97129 Non-pressure chronic ulcer of left thigh with unspecified severity: Secondary | ICD-10-CM | POA: Diagnosis present

## 2024-01-03 DIAGNOSIS — Z8249 Family history of ischemic heart disease and other diseases of the circulatory system: Secondary | ICD-10-CM

## 2024-01-03 DIAGNOSIS — I959 Hypotension, unspecified: Principal | ICD-10-CM

## 2024-01-03 DIAGNOSIS — E871 Hypo-osmolality and hyponatremia: Secondary | ICD-10-CM | POA: Diagnosis present

## 2024-01-03 DIAGNOSIS — I428 Other cardiomyopathies: Secondary | ICD-10-CM | POA: Diagnosis present

## 2024-01-03 DIAGNOSIS — I5021 Acute systolic (congestive) heart failure: Secondary | ICD-10-CM | POA: Diagnosis not present

## 2024-01-03 DIAGNOSIS — E039 Hypothyroidism, unspecified: Secondary | ICD-10-CM | POA: Diagnosis present

## 2024-01-03 DIAGNOSIS — J9622 Acute and chronic respiratory failure with hypercapnia: Secondary | ICD-10-CM | POA: Diagnosis present

## 2024-01-03 DIAGNOSIS — Z91119 Patient's noncompliance with dietary regimen due to unspecified reason: Secondary | ICD-10-CM

## 2024-01-03 DIAGNOSIS — J9621 Acute and chronic respiratory failure with hypoxia: Secondary | ICD-10-CM | POA: Diagnosis present

## 2024-01-03 DIAGNOSIS — Z87892 Personal history of anaphylaxis: Secondary | ICD-10-CM

## 2024-01-03 DIAGNOSIS — L0390 Cellulitis, unspecified: Secondary | ICD-10-CM | POA: Diagnosis present

## 2024-01-03 DIAGNOSIS — A419 Sepsis, unspecified organism: Secondary | ICD-10-CM | POA: Diagnosis present

## 2024-01-03 DIAGNOSIS — E440 Moderate protein-calorie malnutrition: Secondary | ICD-10-CM | POA: Diagnosis not present

## 2024-01-03 DIAGNOSIS — Z515 Encounter for palliative care: Secondary | ICD-10-CM | POA: Diagnosis not present

## 2024-01-03 DIAGNOSIS — D689 Coagulation defect, unspecified: Secondary | ICD-10-CM | POA: Diagnosis not present

## 2024-01-03 DIAGNOSIS — Z79899 Other long term (current) drug therapy: Secondary | ICD-10-CM

## 2024-01-03 DIAGNOSIS — R0602 Shortness of breath: Secondary | ICD-10-CM | POA: Diagnosis present

## 2024-01-03 DIAGNOSIS — G9340 Encephalopathy, unspecified: Secondary | ICD-10-CM | POA: Diagnosis not present

## 2024-01-03 DIAGNOSIS — K56609 Unspecified intestinal obstruction, unspecified as to partial versus complete obstruction: Secondary | ICD-10-CM | POA: Diagnosis not present

## 2024-01-03 DIAGNOSIS — I9589 Other hypotension: Secondary | ICD-10-CM | POA: Diagnosis present

## 2024-01-03 DIAGNOSIS — N2581 Secondary hyperparathyroidism of renal origin: Secondary | ICD-10-CM | POA: Diagnosis present

## 2024-01-03 DIAGNOSIS — X58XXXA Exposure to other specified factors, initial encounter: Secondary | ICD-10-CM | POA: Diagnosis present

## 2024-01-03 DIAGNOSIS — I5023 Acute on chronic systolic (congestive) heart failure: Secondary | ICD-10-CM | POA: Diagnosis present

## 2024-01-03 DIAGNOSIS — Z7189 Other specified counseling: Secondary | ICD-10-CM | POA: Diagnosis not present

## 2024-01-03 DIAGNOSIS — E662 Morbid (severe) obesity with alveolar hypoventilation: Secondary | ICD-10-CM | POA: Diagnosis present

## 2024-01-03 DIAGNOSIS — G9341 Metabolic encephalopathy: Secondary | ICD-10-CM | POA: Diagnosis not present

## 2024-01-03 DIAGNOSIS — L97119 Non-pressure chronic ulcer of right thigh with unspecified severity: Secondary | ICD-10-CM | POA: Diagnosis present

## 2024-01-03 DIAGNOSIS — Z87891 Personal history of nicotine dependence: Secondary | ICD-10-CM

## 2024-01-03 DIAGNOSIS — L89302 Pressure ulcer of unspecified buttock, stage 2: Secondary | ICD-10-CM | POA: Diagnosis present

## 2024-01-03 DIAGNOSIS — I5043 Acute on chronic combined systolic (congestive) and diastolic (congestive) heart failure: Secondary | ICD-10-CM | POA: Diagnosis not present

## 2024-01-03 DIAGNOSIS — R1084 Generalized abdominal pain: Secondary | ICD-10-CM | POA: Diagnosis not present

## 2024-01-03 DIAGNOSIS — I330 Acute and subacute infective endocarditis: Secondary | ICD-10-CM | POA: Diagnosis present

## 2024-01-03 DIAGNOSIS — R6521 Severe sepsis with septic shock: Secondary | ICD-10-CM | POA: Diagnosis present

## 2024-01-03 DIAGNOSIS — K761 Chronic passive congestion of liver: Secondary | ICD-10-CM | POA: Diagnosis present

## 2024-01-03 DIAGNOSIS — Z992 Dependence on renal dialysis: Secondary | ICD-10-CM | POA: Diagnosis not present

## 2024-01-03 DIAGNOSIS — N186 End stage renal disease: Secondary | ICD-10-CM | POA: Diagnosis present

## 2024-01-03 DIAGNOSIS — M898X9 Other specified disorders of bone, unspecified site: Secondary | ICD-10-CM | POA: Diagnosis present

## 2024-01-03 DIAGNOSIS — Z7901 Long term (current) use of anticoagulants: Secondary | ICD-10-CM

## 2024-01-03 DIAGNOSIS — R579 Shock, unspecified: Secondary | ICD-10-CM | POA: Diagnosis present

## 2024-01-03 DIAGNOSIS — I469 Cardiac arrest, cause unspecified: Secondary | ICD-10-CM | POA: Diagnosis not present

## 2024-01-03 DIAGNOSIS — E7220 Disorder of urea cycle metabolism, unspecified: Secondary | ICD-10-CM | POA: Diagnosis not present

## 2024-01-03 DIAGNOSIS — L98499 Non-pressure chronic ulcer of skin of other sites with unspecified severity: Secondary | ICD-10-CM | POA: Insufficient documentation

## 2024-01-03 DIAGNOSIS — D72829 Elevated white blood cell count, unspecified: Secondary | ICD-10-CM | POA: Diagnosis not present

## 2024-01-03 DIAGNOSIS — E872 Acidosis, unspecified: Secondary | ICD-10-CM | POA: Diagnosis not present

## 2024-01-03 DIAGNOSIS — Z880 Allergy status to penicillin: Secondary | ICD-10-CM

## 2024-01-03 DIAGNOSIS — E875 Hyperkalemia: Secondary | ICD-10-CM | POA: Diagnosis present

## 2024-01-03 DIAGNOSIS — Z79890 Hormone replacement therapy (postmenopausal): Secondary | ICD-10-CM

## 2024-01-03 DIAGNOSIS — S70322A Blister (nonthermal), left thigh, initial encounter: Secondary | ICD-10-CM | POA: Diagnosis present

## 2024-01-03 LAB — CBC WITH DIFFERENTIAL/PLATELET
Abs Immature Granulocytes: 0.2 10*3/uL — ABNORMAL HIGH (ref 0.00–0.07)
Basophils Absolute: 0 10*3/uL (ref 0.0–0.1)
Basophils Relative: 0 %
Eosinophils Absolute: 0 10*3/uL (ref 0.0–0.5)
Eosinophils Relative: 0 %
HCT: 29.2 % — ABNORMAL LOW (ref 39.0–52.0)
Hemoglobin: 9.7 g/dL — ABNORMAL LOW (ref 13.0–17.0)
Immature Granulocytes: 1 %
Lymphocytes Relative: 3 %
Lymphs Abs: 0.6 10*3/uL — ABNORMAL LOW (ref 0.7–4.0)
MCH: 29.8 pg (ref 26.0–34.0)
MCHC: 33.2 g/dL (ref 30.0–36.0)
MCV: 89.6 fL (ref 80.0–100.0)
Monocytes Absolute: 1.2 10*3/uL — ABNORMAL HIGH (ref 0.1–1.0)
Monocytes Relative: 6 %
Neutro Abs: 18.6 10*3/uL — ABNORMAL HIGH (ref 1.7–7.7)
Neutrophils Relative %: 90 %
Platelets: 309 10*3/uL (ref 150–400)
RBC: 3.26 MIL/uL — ABNORMAL LOW (ref 4.22–5.81)
RDW: 17.4 % — ABNORMAL HIGH (ref 11.5–15.5)
WBC: 20.6 10*3/uL — ABNORMAL HIGH (ref 4.0–10.5)
nRBC: 0 % (ref 0.0–0.2)

## 2024-01-03 LAB — TROPONIN I (HIGH SENSITIVITY): Troponin I (High Sensitivity): 84 ng/L — ABNORMAL HIGH (ref <18)

## 2024-01-03 LAB — COMPREHENSIVE METABOLIC PANEL WITH GFR
ALT: 32 U/L (ref 0–44)
AST: 84 U/L — ABNORMAL HIGH (ref 15–41)
Albumin: 1.7 g/dL — ABNORMAL LOW (ref 3.5–5.0)
Alkaline Phosphatase: 346 U/L — ABNORMAL HIGH (ref 38–126)
Anion gap: 16 — ABNORMAL HIGH (ref 5–15)
BUN: 61 mg/dL — ABNORMAL HIGH (ref 6–20)
CO2: 24 mmol/L (ref 22–32)
Calcium: 9.2 mg/dL (ref 8.9–10.3)
Chloride: 89 mmol/L — ABNORMAL LOW (ref 98–111)
Creatinine, Ser: 6.92 mg/dL — ABNORMAL HIGH (ref 0.61–1.24)
GFR, Estimated: 10 mL/min — ABNORMAL LOW (ref >60)
Glucose, Bld: 93 mg/dL (ref 70–99)
Potassium: 6.3 mmol/L (ref 3.5–5.1)
Sodium: 129 mmol/L — ABNORMAL LOW (ref 135–145)
Total Bilirubin: 12 mg/dL — ABNORMAL HIGH (ref 0.0–1.2)
Total Protein: 6.4 g/dL — ABNORMAL LOW (ref 6.5–8.1)

## 2024-01-03 LAB — BLOOD GAS, VENOUS
Acid-base deficit: 1.7 mmol/L (ref 0.0–2.0)
Bicarbonate: 23.7 mmol/L (ref 20.0–28.0)
Drawn by: 32061
O2 Saturation: 82.2 %
Patient temperature: 36.6
pCO2, Ven: 41 mmHg — ABNORMAL LOW (ref 44–60)
pH, Ven: 7.37 (ref 7.25–7.43)
pO2, Ven: 51 mmHg — ABNORMAL HIGH (ref 32–45)

## 2024-01-03 LAB — TSH: TSH: 4.023 u[IU]/mL (ref 0.350–4.500)

## 2024-01-03 LAB — I-STAT CG4 LACTIC ACID, ED: Lactic Acid, Venous: 3.8 mmol/L (ref 0.5–1.9)

## 2024-01-03 LAB — PROTIME-INR
INR: 2.9 — ABNORMAL HIGH (ref 0.8–1.2)
Prothrombin Time: 30.9 s — ABNORMAL HIGH (ref 11.4–15.2)

## 2024-01-03 LAB — MRSA NEXT GEN BY PCR, NASAL: MRSA by PCR Next Gen: NOT DETECTED

## 2024-01-03 LAB — DIGOXIN LEVEL: Digoxin Level: 0.9 ng/mL (ref 0.8–2.0)

## 2024-01-03 LAB — BRAIN NATRIURETIC PEPTIDE: B Natriuretic Peptide: 1274.2 pg/mL — ABNORMAL HIGH (ref 0.0–100.0)

## 2024-01-03 LAB — BILIRUBIN, DIRECT: Bilirubin, Direct: 8.6 mg/dL — ABNORMAL HIGH (ref 0.0–0.2)

## 2024-01-03 LAB — GLUCOSE, CAPILLARY: Glucose-Capillary: 128 mg/dL — ABNORMAL HIGH (ref 70–99)

## 2024-01-03 MED ORDER — SODIUM CHLORIDE 0.9 % IV SOLN
1.0000 g | INTRAVENOUS | Status: DC
  Administered 2024-01-03: 1 g via INTRAVENOUS
  Filled 2024-01-03: qty 1

## 2024-01-03 MED ORDER — HYDROMORPHONE HCL 1 MG/ML IJ SOLN
1.0000 mg | INTRAMUSCULAR | Status: DC | PRN
  Administered 2024-01-03 – 2024-01-08 (×23): 1 mg via INTRAVENOUS
  Filled 2024-01-03 (×23): qty 1

## 2024-01-03 MED ORDER — MEDIHONEY WOUND/BURN DRESSING EX PSTE
1.0000 | PASTE | Freq: Every day | CUTANEOUS | Status: DC
  Administered 2024-01-04 – 2024-01-28 (×17): 1 via TOPICAL
  Filled 2024-01-03 (×2): qty 44

## 2024-01-03 MED ORDER — LINEZOLID 600 MG/300ML IV SOLN
600.0000 mg | Freq: Once | INTRAVENOUS | Status: AC
  Administered 2024-01-03: 600 mg via INTRAVENOUS
  Filled 2024-01-03: qty 300

## 2024-01-03 MED ORDER — ALBUTEROL SULFATE (2.5 MG/3ML) 0.083% IN NEBU
10.0000 mg | INHALATION_SOLUTION | Freq: Once | RESPIRATORY_TRACT | Status: AC
  Filled 2024-01-03: qty 12

## 2024-01-03 MED ORDER — DOCUSATE SODIUM 100 MG PO CAPS
100.0000 mg | ORAL_CAPSULE | Freq: Two times a day (BID) | ORAL | Status: DC | PRN
  Administered 2024-01-11: 100 mg via ORAL
  Filled 2024-01-03 (×2): qty 1

## 2024-01-03 MED ORDER — SODIUM CHLORIDE 0.9 % IV SOLN
2.0000 g | Freq: Once | INTRAVENOUS | Status: DC
  Filled 2024-01-03: qty 12.5

## 2024-01-03 MED ORDER — METRONIDAZOLE 500 MG/100ML IV SOLN
500.0000 mg | Freq: Two times a day (BID) | INTRAVENOUS | Status: DC
  Administered 2024-01-03 – 2024-01-04 (×2): 500 mg via INTRAVENOUS
  Filled 2024-01-03 (×2): qty 100

## 2024-01-03 MED ORDER — PRISMASOL BGK 4/2.5 32-4-2.5 MEQ/L EC SOLN
Status: DC
Start: 2024-01-03 — End: 2024-01-04

## 2024-01-03 MED ORDER — SODIUM CHLORIDE 0.9 % IV BOLUS
500.0000 mL | Freq: Once | INTRAVENOUS | Status: AC
  Administered 2024-01-03: 500 mL via INTRAVENOUS

## 2024-01-03 MED ORDER — OXYCODONE HCL 5 MG PO TABS
10.0000 mg | ORAL_TABLET | Freq: Four times a day (QID) | ORAL | Status: DC | PRN
  Administered 2024-01-04 – 2024-01-12 (×22): 10 mg via ORAL
  Filled 2024-01-03 (×24): qty 2

## 2024-01-03 MED ORDER — SODIUM CHLORIDE 0.9 % IV SOLN
200.0000 mg | Freq: Once | INTRAVENOUS | Status: AC
  Administered 2024-01-03: 200 mg via INTRAVENOUS
  Filled 2024-01-03: qty 10

## 2024-01-03 MED ORDER — ORAL CARE MOUTH RINSE
15.0000 mL | OROMUCOSAL | Status: DC
  Administered 2024-01-04 – 2024-01-26 (×67): 15 mL via OROMUCOSAL

## 2024-01-03 MED ORDER — PANTOPRAZOLE SODIUM 40 MG IV SOLR
40.0000 mg | INTRAVENOUS | Status: DC
  Administered 2024-01-03: 40 mg via INTRAVENOUS
  Filled 2024-01-03: qty 10

## 2024-01-03 MED ORDER — METRONIDAZOLE 500 MG/100ML IV SOLN: 500.0000 mg | Freq: Once | INTRAVENOUS | Status: DC

## 2024-01-03 MED ORDER — ZINC SULFATE 220 (50 ZN) MG PO CAPS
220.0000 mg | ORAL_CAPSULE | Freq: Every day | ORAL | Status: DC
  Administered 2024-01-04 – 2024-01-16 (×13): 220 mg via ORAL
  Filled 2024-01-03 (×13): qty 1

## 2024-01-03 MED ORDER — MIDODRINE HCL 5 MG PO TABS
30.0000 mg | ORAL_TABLET | Freq: Three times a day (TID) | ORAL | Status: DC
  Administered 2024-01-03 – 2024-01-04 (×2): 30 mg via ORAL
  Filled 2024-01-03 (×2): qty 6

## 2024-01-03 MED ORDER — VENLAFAXINE HCL ER 37.5 MG PO CP24
37.5000 mg | ORAL_CAPSULE | Freq: Every day | ORAL | Status: DC
  Administered 2024-01-04 – 2024-01-16 (×13): 37.5 mg via ORAL
  Filled 2024-01-03 (×14): qty 1

## 2024-01-03 MED ORDER — ORAL CARE MOUTH RINSE: 15.0000 mL | OROMUCOSAL | Status: DC | PRN

## 2024-01-03 MED ORDER — CAMPHOR-MENTHOL 0.5-0.5 % EX LOTN
TOPICAL_LOTION | CUTANEOUS | Status: DC | PRN
  Filled 2024-01-03 (×2): qty 222

## 2024-01-03 MED ORDER — ALBUMIN HUMAN 25 % IV SOLN
25.0000 g | Freq: Four times a day (QID) | INTRAVENOUS | Status: AC
  Administered 2024-01-03 – 2024-01-04 (×4): 25 g via INTRAVENOUS
  Filled 2024-01-03 (×4): qty 100

## 2024-01-03 MED ORDER — IOHEXOL 350 MG/ML SOLN
100.0000 mL | Freq: Once | INTRAVENOUS | Status: AC | PRN
  Administered 2024-01-03: 100 mL via INTRAVENOUS

## 2024-01-03 MED ORDER — CHLORHEXIDINE GLUCONATE CLOTH 2 % EX PADS
6.0000 | MEDICATED_PAD | Freq: Every day | CUTANEOUS | Status: DC
  Administered 2024-01-08 – 2024-01-27 (×18): 6 via TOPICAL

## 2024-01-03 MED ORDER — SODIUM CHLORIDE 0.9 % IV SOLN
250.0000 mL | INTRAVENOUS | Status: AC
  Administered 2024-01-03: 1000 mL via INTRAVENOUS

## 2024-01-03 MED ORDER — NOREPINEPHRINE 4 MG/250ML-% IV SOLN
0.0000 ug/min | INTRAVENOUS | Status: DC
  Administered 2024-01-03: 6 ug/min via INTRAVENOUS
  Administered 2024-01-04 (×2): 5 ug/min via INTRAVENOUS
  Administered 2024-01-05: 9 ug/min via INTRAVENOUS
  Administered 2024-01-05: 7 ug/min via INTRAVENOUS
  Administered 2024-01-05: 9 ug/min via INTRAVENOUS
  Administered 2024-01-06: 6 ug/min via INTRAVENOUS
  Administered 2024-01-06 (×2): 7 ug/min via INTRAVENOUS
  Administered 2024-01-07: 4 ug/min via INTRAVENOUS
  Filled 2024-01-03 (×10): qty 250

## 2024-01-03 MED ORDER — POLYETHYLENE GLYCOL 3350 17 G PO PACK
17.0000 g | PACK | Freq: Every day | ORAL | Status: DC | PRN
  Filled 2024-01-03: qty 1

## 2024-01-03 MED ORDER — SODIUM CHLORIDE 0.9 % IV SOLN
200.0000 mg | INTRAVENOUS | Status: DC
  Filled 2024-01-03: qty 10

## 2024-01-03 MED ORDER — SODIUM CHLORIDE 0.9 % IV SOLN: 100.0000 mg | INTRAVENOUS | Status: DC

## 2024-01-03 MED ORDER — PREGABALIN 25 MG PO CAPS
50.0000 mg | ORAL_CAPSULE | Freq: Every day | ORAL | Status: DC
  Administered 2024-01-04 – 2024-01-16 (×13): 50 mg via ORAL
  Filled 2024-01-03 (×13): qty 2

## 2024-01-03 MED ORDER — NOREPINEPHRINE 4 MG/250ML-% IV SOLN
0.0000 ug/min | INTRAVENOUS | Status: DC
  Administered 2024-01-03: 6 ug/min via INTRAVENOUS
  Filled 2024-01-03: qty 250

## 2024-01-03 MED ORDER — ANTICOAGULANT SODIUM CITRATE 4% (200MG/5ML) IV SOLN
5.0000 mL | Status: DC | PRN
  Administered 2024-01-12 – 2024-01-19 (×2): 5 mL
  Filled 2024-01-03 (×4): qty 5

## 2024-01-03 MED ORDER — PRISMASOL BGK 4/2.5 32-4-2.5 MEQ/L EC SOLN: Status: DC

## 2024-01-03 MED ORDER — ZINC OXIDE 40 % EX OINT
TOPICAL_OINTMENT | Freq: Two times a day (BID) | CUTANEOUS | Status: DC
  Administered 2024-01-27: 1 via TOPICAL
  Filled 2024-01-03 (×3): qty 57

## 2024-01-03 MED ORDER — LEVOTHYROXINE SODIUM 25 MCG PO TABS
25.0000 ug | ORAL_TABLET | Freq: Every day | ORAL | Status: DC
  Administered 2024-01-04 – 2024-01-16 (×13): 25 ug via ORAL
  Filled 2024-01-03 (×13): qty 1

## 2024-01-03 MED ORDER — SODIUM CHLORIDE 0.9 % IV SOLN
2.0000 g | Freq: Two times a day (BID) | INTRAVENOUS | Status: DC
  Administered 2024-01-04: 2 g via INTRAVENOUS
  Filled 2024-01-03: qty 12.5

## 2024-01-03 NOTE — Progress Notes (Signed)
 Pt requesting bipap be taken off at this time and to be put back on his nasal cannula. Mask removed at this time and pt placed on 9L HFNC without complication.

## 2024-01-03 NOTE — ED Provider Notes (Signed)
 [facility] AT [facility] Provider Note   CSN: [id number] Arrival date & time: 01/03/24  1421     History  Chief Complaint  Patient presents with   Shortness of Breath    Phillip Young is a 38 y.o. male.  HPI Patient presents with shortness of breath.  Reportedly also having fevers and chills.  Reportedly is here because he short of breath and was discharged from [facility] today.  Is on IV vancomycin  for presumed endocarditis.  Also reportedly has potential cellulitis on his extremities buttock area.   Past Medical History:  Diagnosis Date   Acute on chronic respiratory failure with hypoxia ([name]) 04/21/2021   Acute on chronic systolic (congestive) heart failure ([name]) 02/26/2020   Amiodarone  toxicity    Anemia    Atrial flutter ([name])    Biventricular congestive heart failure ([name])    Candida parapsilosis infection 12/26/2023   Chronic hypoxemic respiratory failure ([name])    Class 3 severe obesity due to excess calories with serious comorbidity and body mass index (BMI) of 50.0 to 59.9 in adult 02/26/2020   ESRD on hemodialysis ([name])    Essential hypertension 02/26/2020   GERD without esophagitis 02/26/2020   Hidradenitis suppurativa 02/26/2020   NICM (nonischemic cardiomyopathy) ([name])    Obesity hypoventilation syndrome ([name])    OSA (obstructive sleep apnea)    PAF (paroxysmal atrial fibrillation) ([name])    Pneumonia    Prediabetes 02/26/2020    Home Medications Prior to Admission medications   Medication Sig Start Date End Date Taking? Authorizing Provider  apixaban  (ELIQUIS ) 5 MG TABS tablet Take 1 tablet (5 mg total) by mouth 2 (two) times daily. 06/03/23  Yes Carleen Chary, DO  camphor-menthol  [facility]) lotion Apply topically as needed for itching. 12/15/23  Yes Ronni Colace, DO  cinacalcet  (SENSIPAR ) 30 MG tablet Take 1 tablet (30 mg total) by mouth every dialysis. Patient taking differently: Take 30 mg by mouth every Monday,  Wednesday, and Friday. 09/12/23  Yes Sheree Dieter, MD  clindamycin  (CLINDAGEL ) 1 % gel Apply topically 2 (two) times daily. Patient taking differently: Apply 1 Application topically 2 (two) times daily. 10/06/23  Yes Juberg, Veryl Gottron, DO  digoxin  (LANOXIN ) 0.125 MG tablet Take 1 tablet (0.125 mg total) by mouth 3 (three) times a week. Monday, Wednesday, Friday (dialysis days) 11/14/23  Yes [name], MD  levothyroxine  (SYNTHROID ) 25 MCG tablet Take 1 tablet (25 mcg total) by mouth daily at 6 (six) AM. 11/12/23  Yes [name], MD  naloxone (NARCAN) 2 MG/2ML injection Inject 2 mg into the skin as needed (for overdose).   Yes [provider]  Oxycodone  HCl 10 MG TABS Take 1 tablet (10 mg total) by mouth every 6 (six) hours as needed. Patient taking differently: Take 10 mg by mouth every 6 (six) hours as needed (for pain). 12/15/23  Yes Ronni Colace, DO  polyethylene glycol (MIRALAX  / GLYCOLAX ) 17 g packet Take 17 g by mouth daily. 12/16/23  Yes Ronni Colace, DO  pregabalin  (LYRICA ) 50 MG capsule Take 1 capsule (50 mg total) by mouth daily. Take one capsule daily. 12/15/23  Yes Ronni Colace, DO  senna (SENOKOT) 8.6 MG TABS tablet Take 1 tablet (8.6 mg total) by mouth daily. 12/16/23  Yes Ronni Colace, DO  sevelamer  carbonate (RENVELA ) 800 MG tablet Take 2 tablets (1,600 mg total) by mouth 3 (three) times daily with meals. Patient taking differently: Take 2,400 mg by mouth 3 (three) times daily with meals. 11/12/23  Yes  [name], MD  vancomycin  1,000 mg in sodium chloride  0.9 % 100 mL Inject 1,000 mg into the vein every Monday, Wednesday, and Friday with hemodialysis. Stop date: 01/23/24 12/16/23 01/23/24 Yes Orson Blalock, NP  venlafaxine  XR (EFFEXOR  XR) 37.5 MG 24 hr capsule Take 1 capsule (37.5 mg total) by mouth daily. 11/12/23 11/11/24 Yes [name], MD  vitamin D3 (CHOLECALCIFEROL ) 25 MCG tablet Take 0.5 tablets (500 Units total) by mouth daily. 11/12/23  Yes [name], MD   zinc  sulfate, 50mg  elemental zinc , 220 (50 Zn) MG capsule Take 1 capsule (220 mg total) by mouth daily. 12/16/23  Yes Ronni Colace, DO  Darbepoetin Alfa  (ARANESP ) 150 MCG/0.3ML SOSY injection To be given during dialysis by [facility] per Nephrology. Not to be given to patient at SNF. 09/06/23   Sheree Dieter, MD  fentaNYL  (DURAGESIC ) 12 MCG/HR Place 1 patch onto the skin every 3 (three) days. 12/15/23   Ronni Colace, DO  midodrine  (PROAMATINE ) 10 MG tablet Take 3 tablets (30 mg total) by mouth 3 (three) times daily with meals. Patient not taking: Reported on 01/03/2024 06/20/23   Lacretia Piccolo, PA-C      Allergies    Amiodarone , Amoxicillin , Coreg  [carvedilol ], Heparin , Metoprolol , Other, and Tramadol     Review of Systems   Review of Systems  Physical Exam Updated Vital Signs BP 90/74   Pulse (!) 130   Temp 98.8 F (37.1 C) (Oral)   Resp (!) 34   Ht 6\' 2"  (1.88 m)   Wt (!) 180 kg   SpO2 95%   BMI 50.95 kg/m  Physical Exam Vitals and nursing note reviewed.  Constitutional:      Appearance: He is obese.  HENT:     Head: Normocephalic.  Cardiovascular:     Rate and Rhythm: Tachycardia present. Rhythm irregular.  Pulmonary:     Breath sounds: No wheezing or rhonchi.  Chest:     Comments: Dialysis catheter right chest wall. Abdominal:     Tenderness: There is abdominal tenderness.  Skin:    Comments: Induration of skin from thighs buttock area and flanks.  Some erythema.  No clear abscess.  Some breakdown of skin between the legs near groin.  Scleral icterus.   Neurological:     Mental Status: He is alert. Mental status is at baseline.     ED Results / Procedures / Treatments   Labs (all labs ordered are listed, but only abnormal results are displayed) Labs Reviewed  COMPREHENSIVE METABOLIC PANEL WITH GFR - Abnormal; Notable for the following components:      Result Value   Sodium 129 (*)    Potassium 6.3 (*)    Chloride 89 (*)    BUN 61 (*)     Creatinine, Ser 6.92 (*)    Total Protein 6.4 (*)    Albumin  1.7 (*)    AST 84 (*)    Alkaline Phosphatase 346 (*)    Total Bilirubin 12.0 (*)    GFR, Estimated 10 (*)    Anion gap 16 (*)    All other components within normal limits  CBC WITH DIFFERENTIAL/PLATELET - Abnormal; Notable for the following components:   WBC 20.6 (*)    RBC 3.26 (*)    Hemoglobin 9.7 (*)    HCT 29.2 (*)    RDW 17.4 (*)    Neutro Abs 18.6 (*)    Lymphs Abs 0.6 (*)    Monocytes Absolute 1.2 (*)    Abs Immature  Granulocytes 0.20 (*)    All other components within normal limits  PROTIME-INR - Abnormal; Notable for the following components:   Prothrombin Time 30.9 (*)    INR 2.9 (*)    All other components within normal limits  BLOOD GAS, VENOUS - Abnormal; Notable for the following components:   pCO2, Ven 41 (*)    pO2, Ven 51 (*)    All other components within normal limits  BRAIN NATRIURETIC PEPTIDE - Abnormal; Notable for the following components:   B Natriuretic Peptide 1,274.2 (*)    All other components within normal limits  BILIRUBIN, DIRECT - Abnormal; Notable for the following components:   Bilirubin, Direct 8.6 (*)    All other components within normal limits  GLUCOSE, CAPILLARY - Abnormal; Notable for the following components:   Glucose-Capillary 128 (*)    All other components within normal limits  I-STAT CG4 LACTIC ACID, ED - Abnormal; Notable for the following components:   Lactic Acid, Venous 3.8 (*)    All other components within normal limits  TROPONIN I (HIGH SENSITIVITY) - Abnormal; Notable for the following components:   Troponin I (High Sensitivity) 84 (*)    All other components within normal limits  MRSA NEXT GEN BY PCR, NASAL  CULTURE, BLOOD (ROUTINE X 2)  CULTURE, BLOOD (ROUTINE X 2)  RESPIRATORY PANEL BY PCR  DIGOXIN  LEVEL  TSH  URINALYSIS, W/ REFLEX TO CULTURE (INFECTION SUSPECTED)  CBC  COMPREHENSIVE METABOLIC PANEL WITH GFR  RENAL FUNCTION PANEL  MAGNESIUM    I-STAT CG4 LACTIC ACID, ED    EKG EKG Interpretation Date/Time:  Tuesday January 03 2024 14:40:15 EDT Ventricular Rate:  125 PR Interval:    QRS Duration:  106 QT Interval:  306 QTC Calculation: 442 R Axis:   95  Text Interpretation: Atrial fibrillation Borderline right axis deviation Abnormal R-wave progression, late transition Nonspecific repol abnormality, inferior leads Confirmed by Mozell Arias [phone]) on 01/03/2024 2:58:27 PM  Radiology US  Abdomen Limited RUQ (LIVER/GB) Result Date: 01/03/2024 CLINICAL DATA:  Jaundice EXAM: ULTRASOUND ABDOMEN LIMITED RIGHT UPPER QUADRANT COMPARISON:  CT today FINDINGS: Gallbladder: Gallbladder is contracted. No visible stones. Negative sonographic Murphy sign. Common bile duct: Diameter: Normal caliber, 2 mm. Liver: No focal lesion identified. Within normal limits in parenchymal echogenicity. Portal vein is patent on color Doppler imaging with normal direction of blood flow towards the liver. Other: None. IMPRESSION: No acute findings. Contracted gallbladder. Electronically Signed   By: Janeece Mechanic M.D.   On: 01/03/2024 19:39   CT CHEST ABDOMEN PELVIS W CONTRAST Result Date: 01/03/2024 CLINICAL DATA:  Sepsis. EXAM: CT CHEST, ABDOMEN, AND PELVIS WITH CONTRAST TECHNIQUE: Multidetector CT imaging of the chest, abdomen and pelvis was performed following the standard protocol during bolus administration of intravenous contrast. RADIATION DOSE REDUCTION: This exam was performed according to the departmental dose-optimization program which includes automated exposure control, adjustment of the mA and/or kV according to patient size and/or use of iterative reconstruction technique. CONTRAST:  OMNIPAQUE  IOHEXOL  350 MG/ML SOLN COMPARISON:  Chest radiograph dated 01/03/2024 and CT pelvis dated 11/01/2023. FINDINGS: Evaluation of this exam is limited due to respiratory motion as well as streak artifact caused by patient's arms. CT CHEST FINDINGS  Cardiovascular: Mild chronic pericardial effusion adjacent to the right atrium measures 2 cm in thickness. Central venous catheter with tip in the right atrium. The thoracic aorta is unremarkable. The origins of the great vessels of the aortic arch and the central pulmonary arteries are patent. Mediastinum/Nodes: No obvious  hilar or mediastinal adenopathy. The esophagus is grossly unremarkable. No mediastinal fluid collection. Lungs/Pleura: Interstitial and interlobular septal prominence with diffuse ground-glass density throughout the lungs most consistent with edema. Pneumonia is not excluded. No pleural effusion or pneumothorax. The central airways are patent. Musculoskeletal: No acute osseous pathology. CT ABDOMEN PELVIS FINDINGS No intra-abdominal free air or free fluid. Hepatobiliary: The liver is unremarkable. No biliary dilatation. The gallbladder is from the visualized and appears contracted. No calcified gallstone. Pancreas: Unremarkable. No pancreatic ductal dilatation or surrounding inflammatory changes. Spleen: Normal in size without focal abnormality. Adrenals/Urinary Tract: The adrenal glands are unremarkable. There is no hydronephrosis on either side. The urinary bladder is collapsed. Stomach/Bowel: There is no bowel obstruction. The appendix is normal. Vascular/Lymphatic: Mild aortoiliac atherosclerotic disease. The IVC is unremarkable. No portal venous gas. There is no adenopathy. Reproductive: The prostate and seminal vesicles are grossly unremarkable. No pelvic mass. Other: There is diffuse subcutaneous edema.  No fluid collection. Musculoskeletal: Irregular appearance of the subcutaneous fat superficial to the sacrum similar to prior CT may be sequela of prior inflammatory/infectious process or represent areas of fat necrosis. No fluid collection. No acute osseous pathology. IMPRESSION: 1. Pulmonary edema. Pneumonia is not excluded. 2. No bowel obstruction. Normal appendix. 3. No fluid  collection. 4.  Aortic Atherosclerosis (ICD10-I70.0). Electronically Signed   By: Angus Bark M.D.   On: 01/03/2024 16:44   DG Chest 2 View Result Date: 01/03/2024 CLINICAL DATA:  Sepsis. EXAM: CHEST - 2 VIEW COMPARISON:  Chest x-ray 12/11/2023 and older FINDINGS: Markedly limited examination. The lateral view is nondiagnostic. Frontal view is under penetrated. Enlarged cardiopericardial silhouette as on prior with vascular congestion. Increasing pleural effusions. No pneumothorax. Right IJ double-lumen catheter with tip overlying the right atrium. Overlapping cardiac leads. IMPRESSION: Extremely limited examination as above. The lateral view is nondiagnostic. Enlarged cardiopericardial silhouette with increasing vascular congestion and pleural effusions. Right IJ double-lumen catheter with tip lower along the mediastinum, possibly right atrial. Please correlate with history. Electronically Signed   By: Adrianna Horde M.D.   On: 01/03/2024 15:17    Procedures Procedures    Medications Ordered in ED Medications  docusate sodium  (COLACE) capsule 100 mg (has no administration in time range)  polyethylene glycol (MIRALAX  / GLYCOLAX ) packet 17 g (has no administration in time range)  camphor-menthol  (SARNA) lotion (has no administration in time range)  levothyroxine  (SYNTHROID ) tablet 25 mcg (has no administration in time range)  midodrine  (PROAMATINE ) tablet 30 mg (30 mg Oral Given 01/03/24 1850)  pregabalin  (LYRICA ) capsule 50 mg (has no administration in time range)  venlafaxine  XR (EFFEXOR -XR) 24 hr capsule 37.5 mg (has no administration in time range)  zinc  sulfate (50mg  elemental zinc ) capsule 220 mg (has no administration in time range)  metroNIDAZOLE  (FLAGYL ) IVPB 500 mg (0 mg Intravenous Stopped 01/03/24 1749)  0.9 %  sodium chloride  infusion ( Intravenous Stopped 01/03/24 1948)  norepinephrine  (LEVOPHED ) 4mg  in (0.016 mg/mL) premix infusion (6 mcg/min Intravenous Infusion Verify  01/03/24 2200)  pantoprazole  (PROTONIX ) injection 40 mg (40 mg Intravenous Given 01/03/24 1835)  albumin  human 25 % solution 25 g (0 g Intravenous Stopped 01/03/24 2125)  HYDROmorphone  (DILAUDID ) injection 1 mg (1 mg Intravenous Given 01/03/24 1943)  oxyCODONE  (Oxy IR/ROXICODONE ) immediate release tablet 10 mg (has no administration in time range)  prismasol  BGK 4/2.5 infusion ( CRRT New Bag/Given 01/03/24 2019)  prismasol  BGK 4/2.5 infusion ( CRRT New Bag/Given 01/03/24 2019)  prismasol  BGK 4/2.5 infusion ( CRRT New Bag/Given 01/03/24  2019)  anticoagulant sodium citrate  solution 5 mL (has no administration in time range)  ceFEPIme  (MAXIPIME ) 2 g in sodium chloride  0.9 % 100 mL IVPB (has no administration in time range)  micafungin (MYCAMINE) 200 mg in sodium chloride  0.9 % 100 mL IVPB (0 mg Intravenous Stopped 01/03/24 1944)    Followed by  micafungin (MYCAMINE) 200 mg in sodium chloride  0.9 % 100 mL IVPB (has no administration in time range)  leptospermum manuka honey (MEDIHONEY) paste 1 Application (has no administration in time range)  liver oil-zinc  oxide (DESITIN) 40 % ointment (has no administration in time range)  Oral care mouth rinse (has no administration in time range)  Oral care mouth rinse (has no administration in time range)  Chlorhexidine  Gluconate Cloth 2 % PADS 6 each (has no administration in time range)  albuterol  (PROVENTIL ) (2.5 MG/3ML) 0.083% nebulizer solution 10 mg (0 mg Nebulization Return to [facility] 01/03/24 1638)  sodium chloride  0.9 % bolus 500 mL ( Intravenous Stopped 01/03/24 1743)  linezolid  (ZYVOX ) IVPB 600 mg (0 mg Intravenous Stopped 01/03/24 1749)  iohexol  (OMNIPAQUE ) 350 MG/ML injection 100 mL (100 mLs Intravenous Contrast Given 01/03/24 1629)    ED Course/ Medical Decision Making/ A&P                                 Medical Decision Making Amount and/or Complexity of Data Reviewed Labs: ordered. Radiology: ordered.  Risk Prescription drug  management. Decision regarding hospitalization.   Patient brought in with hypotension.  Prophy breathing.  Dialysis patient was dialyzed yesterday.  May have cellulitis.  Already on vancomycin .  I think a large component of this is that he was discharged from [facility] today.  However has hypotension.  Is on midodrine .  Initial hypotension has improved although may be secondary to the pain with her coming to home.  Will get basic blood work.  Chest x-ray.  Discussed with pharmacist will help review records and determine antibiotic choice for potential sepsis.  Blood pressure has improved some.  Will continue to monitor.  Lactic acid elevated but below 4.  Chest x-ray shows potential volume overload but no clear pneumonia.  Will give antibiotics to cover cellulitis/skin infection.  Patient has new jaundice.  Will get CT scan of chest abdomen pelvis to evaluate for causes of sepsis.  However has had return of hypotension.  With this we will give small fluid bolus although does appear somewhat volume overloaded on x-ray.  Will also start peripheral Levophed  for now.  Also with hyperkalemia we will give insulin .   Discussed with ICU who will admit.  CRITICAL CARE Performed by: Mozell Arias Total critical care time: 30 minutes Critical care time was exclusive of separately billable procedures and treating other patients. Critical care was necessary to treat or prevent imminent or life-threatening deterioration. Critical care was time spent personally by me on the following activities: development of treatment plan with patient and/or surrogate as well as nursing, discussions with consultants, evaluation of patient's response to treatment, examination of patient, obtaining history from patient or surrogate, ordering and performing treatments and interventions, ordering and review of laboratory studies, ordering and review of radiographic studies, pulse oximetry and re-evaluation of patient's  condition.          Final Clinical Impression(s) / ED Diagnoses Final diagnoses:  Hypotension, unspecified hypotension type  Sepsis, due to unspecified organism, unspecified whether acute organ dysfunction present ([name])  Rx / DC Orders ED Discharge Orders     None         Mozell Arias, MD 01/03/24 2240

## 2024-01-03 NOTE — ED Triage Notes (Signed)
 From home with [facility], spo2 on 6L sats are 90%,

## 2024-01-03 NOTE — Consult Note (Signed)
 WOC Nurse Consult Note: this patient is well known to WOC team from multiple previous admissions; last seen with skin tears to foot and B thighs  Reason for Consult: multiple wounds  Wound type: 1.  Buttocks with scattered stage 2 Pressure injuries pink dry  2.  B inner thighs formerly noted skin tears now presenting with some dark hemorrhagic tissue and some yellow necrotic tissue  3.  L distal thigh pink moist full thickness  4.  L foot prior noted skin tear now with necrotic tissue  Pressure Injury POA: Yes Measurement: see nursing flowsheet  Wound bed: as above  Drainage (amount, consistency, odor) see nursing flowsheet  Periwound: Dressing procedure/placement/frequency:  Cleanse buttocks with soap and water , dry and apply a thin layer of Desitin 2 times a day and prn soiling  Cleanse B inner thighs and L distal thigh with Vashe wound cleanser [employer] [phone]) do not rinse and allow to air dry. Apply Xeroform gauze ([place] [phone]) to wound beds daily and secure with silicone foam or ABD pad and tape whichever is preferred.  L dorsal foot wound cleanse with Vashe, apply Medihoney to wound bed daily and cover with dry gauze and silicone foam to secure.  POC discussed with bedside nurse. WOC team will not follow.  Re-consult if further needs arise.   Thank you,    Ronni Colace MSN, RN-BC, [employer] [phone]

## 2024-01-03 NOTE — Progress Notes (Signed)
 ED Pharmacy Antibiotic Sign Off An antibiotic consult was received from an ED provider for pharnacy recommendations for sepsis.   Patient is from [facility] where he was being treated with vancomycin  750 mg with dialysis. Per pharmacist at [facility], patient last received 12/30/23 as patient refused yesterday's dose.  The following one time order(s) were placed per pharmacy consult:  cefepime  2000 mg x 1 dose linezolid  600 mg x 1 dose Metronidazolie 500mg  x 1 dose  Further antibiotic and/or antibiotic pharmacy consults should be ordered by the admitting provider if indicated.   Thank you for allowing pharmacy to be a part of this patient's care.   Dionicio Fray, PharmD, BCPS 01/03/2024 3:58 PM ED Clinical Pharmacist -  [phone]

## 2024-01-03 NOTE — Consult Note (Signed)
 [facility]  HISTORY AND PHYSICAL  Tylin Force is an 38 y.o. male.    Chief Complaint: shock  HPI: Pt is a 76M with ESRD on HD, noncompliance, NICM, and recent admission to [facility] 5/13-5/22 for MRSA bacteremia/ presumed endocarditis and septic shock and COVID who is now seen in consultation at the request of Dr. Felipe Horton for eval and recs re: management of ESRD and provision of HD.  Pt seen in ICU.  He was discharged from [facility] to [facility] where he was receiving HD and antibiotics as well as wound care.  He says they "did him so wrong" at [facility] so he signed himself out today.  Went home.  Had EMS bring him here to [facility].    Says his last HD was yesterday.  K is 6.3 here.  On levophed  at 6.  Cultures/ antibiotics etc have been ordered and are being administered.    In this setting we are asked to see.  Pt reports allover pain.    PMH: Past Medical History:  Diagnosis Date   Acute on chronic respiratory failure with hypoxia ([name]) 04/21/2021   Acute on chronic systolic (congestive) heart failure ([name]) 02/26/2020   Amiodarone  toxicity    Anemia    Atrial flutter ([name])    Biventricular congestive heart failure ([name])    Candida parapsilosis infection 12/26/2023   Chronic hypoxemic respiratory failure ([name])    Class 3 severe obesity due to excess calories with serious comorbidity and body mass index (BMI) of 50.0 to 59.9 in adult 02/26/2020   ESRD on hemodialysis [facility])    Essential hypertension 02/26/2020   GERD without esophagitis 02/26/2020   Hidradenitis suppurativa 02/26/2020   NICM (nonischemic cardiomyopathy) ([name])    Obesity hypoventilation syndrome ([name])    OSA (obstructive sleep apnea)    PAF (paroxysmal atrial fibrillation) ([name])    Pneumonia    Prediabetes 02/26/2020   PSH: Past Surgical History:  Procedure Laterality Date   ABSCESS DRAINAGE     AV FISTULA PLACEMENT Left 08/21/2021   Procedure: LEFT ARM ARTERIOVENOUS (AV) FISTULA.;  Surgeon: Margherita Shell,  MD;  Location: [facility] OR;  Service: Vascular;  Laterality: Left;   CARDIAC CATHETERIZATION     CARDIOVERSION N/A 10/09/2021   Procedure: CARDIOVERSION;  Surgeon: Darlis Eisenmenger, MD;  Location: [facility] ENDOSCOPY;  Service: Cardiovascular;  Laterality: N/A;   CARDIOVERSION N/A 05/28/2022   Procedure: CARDIOVERSION;  Surgeon: Darlis Eisenmenger, MD;  Location: [facility] ENDOSCOPY;  Service: Cardiovascular;  Laterality: N/A;   CARDIOVERSION N/A 06/07/2022   Procedure: CARDIOVERSION;  Surgeon: Darlis Eisenmenger, MD;  Location: [facility] ENDOSCOPY;  Service: Cardiovascular;  Laterality: N/A;   IR FLUORO GUIDE CV LINE RIGHT  03/10/2021   IR FLUORO GUIDE CV LINE RIGHT  04/22/2021   IR FLUORO GUIDE CV LINE RIGHT  08/20/2021   IR FLUORO GUIDE CV LINE RIGHT  03/01/2023   IR FLUORO GUIDE CV LINE RIGHT  03/09/2023   IR FLUORO GUIDE CV LINE RIGHT  05/30/2023   IR FLUORO GUIDE CV LINE RIGHT  11/10/2023   IR FLUORO GUIDE CV LINE RIGHT  12/15/2023   IR PTA VENOUS EXCEPT DIALYSIS CIRCUIT  03/09/2023   IR REMOVAL TUN CV CATH W/O FL  05/26/2023   IR REMOVAL TUN CV CATH W/O FL  11/07/2023   IR REMOVAL TUN CV CATH W/O FL  12/13/2023   IR REMOVE CV FIBRIN SHEATH  03/09/2023   IR US  GUIDE VASC ACCESS RIGHT  03/10/2021   IR US   GUIDE VASC ACCESS RIGHT  04/22/2021   IR US  GUIDE VASC ACCESS RIGHT  05/30/2023   IR US  GUIDE VASC ACCESS RIGHT  11/10/2023   IR US  GUIDE VASC ACCESS RIGHT  12/15/2023   RIGHT HEART CATH N/A 03/06/2021   Procedure: RIGHT HEART CATH;  Surgeon: Mardell Shade, MD;  Location: [facility] INVASIVE CV LAB;  Service: Cardiovascular;  Laterality: N/A;   RIGHT/LEFT HEART CATH AND CORONARY ANGIOGRAPHY N/A 03/04/2020   Procedure: RIGHT/LEFT HEART CATH AND CORONARY ANGIOGRAPHY;  Surgeon: Darlis Eisenmenger, MD;  Location: [facility] INVASIVE CV LAB;  Service: Cardiovascular;  Laterality: N/A;   TEE WITHOUT CARDIOVERSION N/A 05/05/2021   Procedure: TRANSESOPHAGEAL ECHOCARDIOGRAM (TEE);  Surgeon: Darlis Eisenmenger, MD;  Location: [facility] ENDOSCOPY;   Service: Cardiovascular;  Laterality: N/A;   TEMPORARY DIALYSIS CATHETER  03/06/2021   Procedure: TEMPORARY DIALYSIS CATHETER;  Surgeon: Mardell Shade, MD;  Location: [facility] INVASIVE CV LAB;  Service: Cardiovascular;;     Past Medical History:  Diagnosis Date   Acute on chronic respiratory failure with hypoxia ([name]) 04/21/2021   Acute on chronic systolic (congestive) heart failure ([name]) 02/26/2020   Amiodarone  toxicity    Anemia    Atrial flutter ([name])    Biventricular congestive heart failure ([name])    Candida parapsilosis infection 12/26/2023   Chronic hypoxemic respiratory failure ([name])    Class 3 severe obesity due to excess calories with serious comorbidity and body mass index (BMI) of 50.0 to 59.9 in adult 02/26/2020   ESRD on hemodialysis ([name])    Essential hypertension 02/26/2020   GERD without esophagitis 02/26/2020   Hidradenitis suppurativa 02/26/2020   NICM (nonischemic cardiomyopathy) ([name])    Obesity hypoventilation syndrome ([name])    OSA (obstructive sleep apnea)    PAF (paroxysmal atrial fibrillation) ([name])    Pneumonia    Prediabetes 02/26/2020    Medications:  Scheduled:  leptospermum manuka honey  1 Application Topical Daily   [START ON 01/04/2024] levothyroxine   25 mcg Oral Q0600   liver oil-zinc  oxide   Topical BID   midodrine   30 mg Oral TID WC   pantoprazole  (PROTONIX ) IV  40 mg Intravenous Q24H   [START ON 01/04/2024] pregabalin   50 mg Oral Daily   [START ON 01/04/2024] venlafaxine  XR  37.5 mg Oral Daily   [START ON 01/04/2024] zinc  sulfate (50mg  elemental zinc )  220 mg Oral Daily    Medications Prior to Admission  Medication Sig Dispense Refill   apixaban  (ELIQUIS ) 5 MG TABS tablet Take 1 tablet (5 mg total) by mouth 2 (two) times daily.     camphor-menthol  (SARNA) lotion Apply topically as needed for itching.     cinacalcet  (SENSIPAR ) 30 MG tablet Take 1 tablet (30 mg total) by mouth every dialysis. (Patient taking differently: Take 30 mg by mouth  every Monday, Wednesday, and Friday.) 60 tablet 0   clindamycin  (CLINDAGEL ) 1 % gel Apply topically 2 (two) times daily. (Patient taking differently: Apply 1 Application topically 2 (two) times daily.) 30 g 0   digoxin  (LANOXIN ) 0.125 MG tablet Take 1 tablet (0.125 mg total) by mouth 3 (three) times a week. Monday, Wednesday, Friday (dialysis days) 30 tablet 1   levothyroxine  (SYNTHROID ) 25 MCG tablet Take 1 tablet (25 mcg total) by mouth daily at 6 (six) AM. 90 tablet 3   naloxone (NARCAN) 2 MG/2ML injection Inject 2 mg into the skin as needed (for overdose).     Oxycodone  HCl 10 MG TABS Take 1 tablet (10 mg total) by  mouth every 6 (six) hours as needed. (Patient taking differently: Take 10 mg by mouth every 6 (six) hours as needed (for pain).) 30 tablet 0   polyethylene glycol (MIRALAX  / GLYCOLAX ) 17 g packet Take 17 g by mouth daily.     pregabalin  (LYRICA ) 50 MG capsule Take 1 capsule (50 mg total) by mouth daily. Take one capsule daily. 90 capsule 1   senna (SENOKOT) 8.6 MG TABS tablet Take 1 tablet (8.6 mg total) by mouth daily.     sevelamer  carbonate (RENVELA ) 800 MG tablet Take 2 tablets (1,600 mg total) by mouth 3 (three) times daily with meals. (Patient taking differently: Take 2,400 mg by mouth 3 (three) times daily with meals.) 180 tablet 0   vancomycin  1,000 mg in sodium chloride  0.9 % 100 mL Inject 1,000 mg into the vein every Monday, Wednesday, and Friday with hemodialysis. Stop date: 01/23/24     venlafaxine  XR (EFFEXOR  XR) 37.5 MG 24 hr capsule Take 1 capsule (37.5 mg total) by mouth daily. 30 capsule 2   vitamin D3 (CHOLECALCIFEROL ) 25 MCG tablet Take 0.5 tablets (500 Units total) by mouth daily. 30 tablet 0   zinc  sulfate, 50mg  elemental zinc , 220 (50 Zn) MG capsule Take 1 capsule (220 mg total) by mouth daily.     Darbepoetin Alfa  (ARANESP ) 150 MCG/0.3ML SOSY injection To be given during dialysis by [facility] per Nephrology. Not to be given to patient at SNF.      fentaNYL  (DURAGESIC ) 12 MCG/HR Place 1 patch onto the skin every 3 (three) days. 5 patch 0   midodrine  (PROAMATINE ) 10 MG tablet Take 3 tablets (30 mg total) by mouth 3 (three) times daily with meals. (Patient not taking: Reported on 01/03/2024) 270 tablet 0    ALLERGIES:   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, SRA positive   Metoprolol  Other (See Comments)    near syncope   Other Swelling and Other (See Comments)    Steroids Fluid seeping out of legs Nebulizer(unknown name of neb) elevated pressure    Tramadol      unknown    FAM HX: Family History  Problem Relation Age of Onset   Heart disease Mother    Hypertension Mother    Pulmonary Hypertension Mother    Drug abuse Father        died due to Heroin overdose    Social History:   reports that he quit smoking about 6 years ago. His smoking use included cigarettes. He has been exposed to tobacco smoke. He has quit using smokeless tobacco. He reports that he does not drink alcohol  and does not use drugs.  ROS: ROS: all other systems reviewed and are negative except as per HPI  Blood pressure 114/70, pulse (!) 133, temperature 98.8 F (37.1 C), temperature source Oral, resp. rate (!) 27, height 6\' 2"  (1.88 m), weight (!) 180 kg, SpO2 95%. PHYSICAL EXAM: Physical Exam GEN sleeping, arousable HEENT EOMI PERRL sclerae icteric NECK no JVD PULM bilateral crackles CV tachycardic ABD obese EXT 3+ pitting edema NEURO sleepy, arousable    Results for orders placed or performed during the hospital encounter of 01/03/24 (from the past 48 hours)  Comprehensive metabolic panel     Status: Abnormal  Collection Time: 01/03/24  2:49 PM  Result Value Ref Range   Sodium 129 (L) 135 - 145 mmol/L    Potassium 6.3 (HH) 3.5 - 5.1 mmol/L    Comment: CRITICAL RESULT CALLED TO, READ BACK BY AND VERIFIED WITH Toni Frank RN ,  @1543  , 01/03/24, Dabdee,T.   Chloride 89 (L) 98 - 111 mmol/L   CO2 24 22 - 32 mmol/L   Glucose, Bld 93 70 - 99 mg/dL    Comment: Glucose reference range applies only to samples taken after fasting for at least 8 hours.   BUN 61 (H) 6 - 20 mg/dL   Creatinine, Ser 8.65 (H) 0.61 - 1.24 mg/dL   Calcium  9.2 8.9 - 10.3 mg/dL   Total Protein 6.4 (L) 6.5 - 8.1 g/dL   Albumin  1.7 (L) 3.5 - 5.0 g/dL   AST 84 (H) 15 - 41 U/L   ALT 32 0 - 44 U/L   Alkaline Phosphatase 346 (H) 38 - 126 U/L   Total Bilirubin 12.0 (H) 0.0 - 1.2 mg/dL   GFR, Estimated 10 (L) >60 mL/min    Comment: (NOTE) Calculated using the CKD-EPI Creatinine Equation (2021)    Anion gap 16 (H) 5 - 15    Comment: Performed at [facility], [address] [place], [place] [address]  CBC with Differential     Status: Abnormal   Collection Time: 01/03/24  2:49 PM  Result Value Ref Range   WBC 20.6 (H) 4.0 - 10.5 K/uL   RBC 3.26 (L) 4.22 - 5.81 MIL/uL   Hemoglobin 9.7 (L) 13.0 - 17.0 g/dL   HCT 62.9 (L) 52.8 - 41.3 %   MCV 89.6 80.0 - 100.0 fL   MCH 29.8 26.0 - 34.0 pg   MCHC 33.2 30.0 - 36.0 g/dL   RDW 24.4 (H) 01.0 - 27.2 %   Platelets 309 150 - 400 K/uL   nRBC 0.0 0.0 - 0.2 %   Neutrophils Relative % 90 %   Neutro Abs 18.6 (H) 1.7 - 7.7 K/uL   Lymphocytes Relative 3 %   Lymphs Abs 0.6 (L) 0.7 - 4.0 K/uL   Monocytes Relative 6 %   Monocytes Absolute 1.2 (H) 0.1 - 1.0 K/uL   Eosinophils Relative 0 %   Eosinophils Absolute 0.0 0.0 - 0.5 K/uL   Basophils Relative 0 %   Basophils Absolute 0.0 0.0 - 0.1 K/uL   Immature Granulocytes 1 %   Abs Immature Granulocytes 0.20 (H) 0.00 - 0.07 K/uL    Comment: Performed at [facility], [address] [place], [place] [address]  Protime-INR     Status: Abnormal   Collection Time: 01/03/24  2:49 PM  Result Value Ref Range   Prothrombin Time  30.9 (H) 11.4 - 15.2 seconds   INR 2.9 (H) 0.8 - 1.2    Comment: (NOTE) INR goal varies based on device and disease states. Performed at [facility], [address], [place], [place] [address]   I-Stat Lactic Acid, ED     Status: Abnormal   Collection Time: 01/03/24  3:08 PM  Result Value Ref Range   Lactic Acid, Venous 3.8 (HH) 0.5 - 1.9 mmol/L   Comment NOTIFIED PHYSICIAN   Digoxin  level     Status: None   Collection Time: 01/03/24  4:03 PM  Result Value Ref Range   Digoxin  Level 0.9 0.8 - 2.0 ng/mL    Comment: Performed at [facility], [address] [place], [place] [address]  Glucose, capillary     Status: Abnormal   Collection Time: 01/03/24  5:40 PM  Result Value Ref Range   Glucose-Capillary 128 (H) 70 - 99 mg/dL    Comment: Glucose reference range applies only to samples taken after fasting for at least 8 hours.  Troponin I (High Sensitivity)     Status: Abnormal   Collection Time: 01/03/24  6:30 PM  Result Value Ref Range   Troponin I (High Sensitivity) 84 (H) <18 ng/L    Comment: (NOTE) Elevated high sensitivity troponin I (hsTnI) values and significant  changes across serial measurements may suggest ACS but many other  chronic and acute conditions are known to elevate hsTnI results.  Refer to the "Links" section for chest pain algorithms and additional  guidance. Performed at [facility], [address] [place], [place] [address]   TSH     Status: None   Collection Time: 01/03/24  6:30 PM  Result Value Ref Range   TSH 4.023 0.350 - 4.500 uIU/mL    Comment: Performed by a 3rd Generation assay with a functional sensitivity of <=0.01 uIU/mL. Performed at [facility], [address], [place], [place] [address]   Brain natriuretic peptide     Status: Abnormal   Collection Time: 01/03/24  6:30 PM  Result Value Ref Range   B Natriuretic Peptide 1,274.2 (H) 0.0 - 100.0 pg/mL    Comment: Performed at [facility], [address], [place], [place] [address]  Bilirubin, direct     Status: Abnormal   Collection Time: 01/03/24  6:30 PM  Result Value Ref Range   Bilirubin, Direct 8.6 (H) 0.0 - 0.2 mg/dL    Comment: Performed at [facility], [address], [place], [place] [address]  Blood gas, venous     Status: Abnormal   Collection Time: 01/03/24  6:35 PM  Result Value Ref Range   pH, Ven 7.37 7.25 - 7.43   pCO2, Ven 41 (L) 44 - 60 mmHg   pO2, Ven 51 (H) 32 - 45 mmHg   Bicarbonate 23.7 20.0 - 28.0 mmol/L   Acid-base deficit 1.7 0.0 - 2.0 mmol/L   O2 Saturation 82.2 %   Patient temperature 36.6    Collection site R.HAND    Drawn by [id number]     Comment: Performed at [facility], [address] [place], [place] [address]    US  Abdomen Limited RUQ (LIVER/GB) Result Date: 01/03/2024 CLINICAL DATA:  Jaundice EXAM: ULTRASOUND ABDOMEN LIMITED RIGHT UPPER QUADRANT COMPARISON:  CT today FINDINGS: Gallbladder: Gallbladder is contracted. No visible stones. Negative sonographic Murphy sign. Common bile duct: Diameter: Normal caliber, 2 mm. Liver: No focal lesion identified. Within normal limits in parenchymal echogenicity. Portal vein is patent on color Doppler imaging with normal direction of blood flow towards the liver. Other: None. IMPRESSION: No acute findings. Contracted gallbladder. Electronically Signed   By: Janeece Mechanic M.D.   On: 01/03/2024 19:39   CT CHEST ABDOMEN PELVIS W CONTRAST Result Date: 01/03/2024 CLINICAL DATA:  Sepsis. EXAM: CT CHEST, ABDOMEN, AND PELVIS WITH CONTRAST TECHNIQUE: Multidetector CT imaging of the chest, abdomen and pelvis was performed following the standard protocol during bolus administration of intravenous contrast. RADIATION DOSE REDUCTION: This exam was performed according to the departmental dose-optimization program which includes automated exposure control, adjustment of the mA and/or kV according to patient size and/or use of iterative reconstruction technique. CONTRAST:  OMNIPAQUE  IOHEXOL  350 MG/ML SOLN COMPARISON:  Chest radiograph dated 01/03/2024 and CT pelvis dated 11/01/2023. FINDINGS: Evaluation of this exam is limited due to respiratory motion as well as streak artifact caused by patient's arms. CT CHEST FINDINGS Cardiovascular: Mild chronic pericardial effusion adjacent to the right atrium measures 2 cm in thickness. Central venous catheter with tip in the right atrium. The thoracic aorta is unremarkable. The origins of the great vessels of the aortic arch and the central pulmonary arteries are patent. Mediastinum/Nodes: No obvious hilar or mediastinal adenopathy. The esophagus is grossly unremarkable. No mediastinal fluid collection. Lungs/Pleura: Interstitial and interlobular septal prominence with diffuse ground-glass density throughout the lungs most consistent with edema. Pneumonia is not excluded. No pleural effusion or pneumothorax. The central airways are patent. Musculoskeletal: No acute osseous pathology. CT ABDOMEN PELVIS FINDINGS No intra-abdominal free air or free fluid. Hepatobiliary: The liver is unremarkable. No biliary dilatation. The gallbladder is from the visualized and appears contracted. No calcified gallstone. Pancreas: Unremarkable. No pancreatic ductal dilatation or surrounding inflammatory changes. Spleen: Normal in size without focal abnormality. Adrenals/Urinary Tract: The adrenal glands are unremarkable. There is no hydronephrosis on either side. The urinary bladder is collapsed. Stomach/Bowel: There is no bowel obstruction. The appendix is normal. Vascular/Lymphatic: Mild aortoiliac atherosclerotic disease. The IVC is unremarkable. No portal venous gas. There is no adenopathy. Reproductive: The prostate and seminal vesicles are grossly unremarkable. No pelvic mass. Other: There is diffuse subcutaneous edema.  No fluid collection. Musculoskeletal: Irregular appearance of the subcutaneous fat superficial to the sacrum similar to prior CT may  be sequela of prior inflammatory/infectious process or represent areas of fat necrosis. No fluid collection. No acute osseous pathology. IMPRESSION: 1. Pulmonary edema. Pneumonia is not excluded. 2. No bowel obstruction. Normal appendix. 3. No fluid collection. 4.  Aortic Atherosclerosis (ICD10-I70.0). Electronically Signed   By: Angus Bark M.D.   On: 01/03/2024 16:44   DG Chest 2 View Result Date: 01/03/2024 CLINICAL DATA:  Sepsis. EXAM: CHEST - 2 VIEW COMPARISON:  Chest x-ray 12/11/2023 and older FINDINGS: Markedly limited examination. The lateral view is nondiagnostic. Frontal view is under penetrated. Enlarged cardiopericardial silhouette as on prior with vascular congestion. Increasing pleural effusions. No pneumothorax. Right IJ double-lumen catheter with tip overlying the right atrium. Overlapping cardiac leads. IMPRESSION: Extremely limited examination as above. The lateral view is nondiagnostic. Enlarged cardiopericardial silhouette with increasing vascular congestion and pleural effusions. Right IJ double-lumen catheter with tip lower along the mediastinum, possibly right atrial. Please correlate with history. Electronically Signed   By: Adrianna Horde M.D.   On: 01/03/2024 15:17    Assessment/Plan  Shock: likely septic.  Cultures, antibiotics on board.  Multiple potential sources of infection.  Per PCCM ESRD on HD: pressures too soft for IHD, will do CRRT.  4K bath, no heparin  d/t HIT Hyperkalemia: will correct with CRRT Hyperbilirubinemia: LFTs not too bad actually- would expect higher if it were shock liver, ? Meds.  RUQ US  negative, ? If needs more imaging Anemia: ESA when needed BMM: binders/ vitamins when eating Dispo: in ICU  Artia Singley 01/03/2024, 8:35 PM

## 2024-01-03 NOTE — Progress Notes (Signed)
   01/03/24 1950  BiPAP/CPAP/SIPAP  BiPAP/CPAP/SIPAP Pt Type Adult  BiPAP/CPAP/SIPAP  (home machine)  Mask Type Full face mask  Dentures removed? Not applicable  Respiratory Rate 26 breaths/min  Pressure Support 10 cmH20  Flow Rate 8 lpm  Minute Ventilation 14.8  Leak 32  Peak Inspiratory Pressure (PIP) 20.4  Tidal Volume (Vt) 532  Patient Home Machine Yes  Safety Check Completed by RT for Home Unit Yes, no issues noted  Patient Home Mask Yes  Patient Home Tubing Yes  Auto Titrate Yes  Minimum cmH2O 5 cmH2O  Maximum cmH2O 15 cmH2O  Press High Alarm  (per home machine)  CPAP/SIPAP surface wiped down Yes  Device Plugged into RED Power Outlet Yes  BiPAP/CPAP /SiPAP Vitals  Pulse Rate (!) 130  Resp (!) 26  SpO2 94 %  Bilateral Breath Sounds Diminished  MEWS Score/Color  MEWS Score 6  MEWS Score Color Red   Mode: TTV-VAPS-AE Auto Rate- on  Target tidal volume per home machine .

## 2024-01-03 NOTE — H&P (Addendum)
 NAME:  Phillip Young, MRN:  [id number], DOB:  1986/05/14, LOS: 0 ADMISSION DATE:  01/03/2024, CONSULTATION DATE:  01/03/24 REFERRING MD:  Dr. Val Garin, CHIEF COMPLAINT:  SOB/ chills   History of Present Illness:   59 yoM w/PMH as below significant for biventricular HF (EF < 20%), chronic afib on Eliquis , chronic hypotension on midodrine  30mg  TID, ESRD, chronic hypoxic and hypercarbic respiratory failure on NIV, morbid obesity, medical non-compliance, and chronic pain presenting from home after being discharged from [facility] 6/9 with SOB, chills, subjective fever, ongoing poor PO, vomiting today strings of blood but then reported it was with coughing episodes, pain all over, and increased weeping from left thigh blister.  Had BM, unclear on consistency/ color.  Reports last dialysis yesterday.  Initially reports not taking any PO meds today but then said he did this morning.  Non- ambulatory at baseline.  Frequent admits, most recently 5/13-5/22 for recurrent septic shock and MRSE bacteremia due to suspected line complicated by acute on chronic HF and respiratory failure, suspected IE but not a candidate for TEE, discharged on vancomycin  with HD scheduled thru 6/30.  TDC was replaced 5/22 after 2 day line holiday.  Also started on micafungin w/HD at [facility] after [facility] grew candida species, scheduled thru 6/30.   In ER, temp 99.1, on HFNC at 6L, with SBP into 70's requiring NE support after 500 ml bolus.  Labs significant for Na 129, K 6.3, Cl 89, alk phos 346, AST 84, t.bili 12 (recent 3.4 on 5/14), lactic 3.8, WBC 20.6 (previously 8.1 on 5/22), INR 2.9, CXR showing vascular congestion and increasing congestion and pleural effusions.  BC sent and empirically started on linezolid , flagyl , and cefepime .  Pending CT chest/abd/ pelvis.  Nephrology consulted.  PCCM called for admit.   Pertinent  Medical History   Past Medical History:  Diagnosis Date   Acute on chronic respiratory failure with hypoxia ([name])  04/21/2021   Acute on chronic systolic (congestive) heart failure ([name]) 02/26/2020   Amiodarone  toxicity    Anemia    Atrial flutter ([name])    Biventricular congestive heart failure ([name])    Candida parapsilosis infection 12/26/2023   Chronic hypoxemic respiratory failure ([name])    Class 3 severe obesity due to excess calories with serious comorbidity and body mass index (BMI) of 50.0 to 59.9 in adult 02/26/2020   ESRD on hemodialysis [facility])    Essential hypertension 02/26/2020   GERD without esophagitis 02/26/2020   Hidradenitis suppurativa 02/26/2020   NICM (nonischemic cardiomyopathy) ([name])    Obesity hypoventilation syndrome ([name])    OSA (obstructive sleep apnea)    PAF (paroxysmal atrial fibrillation) ([name])    Pneumonia    Prediabetes 02/26/2020   Significant Hospital Events: Including procedures, antibiotic start and stop dates in addition to other pertinent events   6/10 Admit  Interim History / Subjective:   Objective    Blood pressure (!) 115/95, pulse (!) 128, temperature 99.1 F (37.3 C), temperature source Oral, resp. rate (!) 33, height 6\' 2"  (1.88 m), weight (!) 180 kg, SpO2 95%.       No intake or output data in the 24 hours ending 01/03/24 1623 Filed Weights   01/03/24 1518  Weight: (!) 180 kg   Examination: General:  AoC ill appearing morbidly obese male in ER stretcher, no acute distress HEENT: MM pink/dry, pupils 3/r, scleral icterus, chronic hoarseness/ soft phonation Neuro:  Awake, appropriate, oriented x 3, initially confused on time, MAE- limited due to pain/ chronic  edema CV: irir, afib with RVR, R TDC - site with scant old blood PULM:  non labored, mild tachypnea, clear anteriorly, diminished in bases, Lucas 6L at 98% GI: obese, +bs, mild diffuse tenderness Extremities: warm/dry, anasarca edema  Skin: no rashes, small lesion to left foot, no drainage, left thigh with previous blister- now with serous drainage, not able to inspect posterior at this  time      Resolved problem list   Assessment and Plan   Shock- likely multifactorial from sepsis- possible intra-abd and LLE cellulitis, recurrent HF exacerbation, and ? Medication non-compliance Elevated bilirubin/ Transaminitis  P:  - admit to ICU - cont peripheral NE for SBP goal > 90 - restart home midodrine  - NPO except with meds pending RUQ US  - broad spectrum abx w/ cefepime , linezolid , flagyl , and micafungin - follow cultures - trend lactate - pending CT chest/ abd/ pelvis - RUQ US  - trend LFTs - ? Hematemesis vs hemoptysis.  Hold eliquis .  Hgb stable, trend.  Empiric PPI    AoC biventricular failure, EF < 20% Persistent Afib Chronic hypotension - hold pta digoxin  and eliquis  for now - HR chronically around 120 on previous admits - midodrine  as above - tele monitoring - optimize electrolytes - volume removal with CRRT   Acute on chronic hypoxic and hypercarbic respiratory failure - cont to wean supplemental O2 for sat goal 88-94% - BiPAP q HS and prn for any increased WOB - volume removal with CRRT - pulm hygiene - pending imaging as above    ESRD with volume overload/ anasarca, on MWF HD  Hypervolemic hyponatremia Hyperkalemia - appreciate Nephrology consult.  CRRT given hypotension.    Anemia of chronic disease - Hgb at baseline - trend CBC - pt ok with albumin  if needed but declines any blood transfusion   Chronic pain  - cont home lyrica , effexor  - hold home oxy and fentanyl  patch for now   Hypothyroid - cont synthroid   Morbid obesity Deconditioning - wound care  GOC - full code per pt  Best Practice (right click and "Reselect all SmartList Selections" daily)   Diet/type: NPO w/ oral meds DVT prophylaxis SCD Pressure ulcer(s): present on admission  GI prophylaxis: PPI Lines: Dialysis Catheter-pta R TDC (12/15/23) Foley:  N/A Code Status:  full code Last date of multidisciplinary goals of care discussion [6/10]  Labs    CBC: Recent Labs  Lab 01/03/24 1449  WBC 20.6*  NEUTROABS 18.6*  HGB 9.7*  HCT 29.2*  MCV 89.6  PLT 309    Basic Metabolic Panel: Recent Labs  Lab 01/03/24 1449  NA 129*  K 6.3*  CL 89*  CO2 24  GLUCOSE 93  BUN 61*  CREATININE 6.92*  CALCIUM  9.2   GFR: Estimated Creatinine Clearance: 24.8 mL/min (A) (by C-G formula based on SCr of 6.92 mg/dL (H)). Recent Labs  Lab 01/03/24 1449 01/03/24 1508  WBC 20.6*  --   LATICACIDVEN  --  3.8*    Liver Function Tests: Recent Labs  Lab 01/03/24 1449  AST 84*  ALT 32  ALKPHOS 346*  BILITOT 12.0*  PROT 6.4*  ALBUMIN  1.7*   No results for input(s): "LIPASE", "AMYLASE" in the last 168 hours. No results for input(s): "AMMONIA" in the last 168 hours.  ABG    Component Value Date/Time   PHART 7.31 (L) 03/08/2023 2030   PCO2ART 37 03/08/2023 2030   PO2ART 127 (H) 03/08/2023 2030   HCO3 15.9 (L) 12/06/2023 0323   TCO2 17 (L)  12/06/2023 0323   ACIDBASEDEF 10.0 (H) 12/06/2023 0323   O2SAT 98 12/06/2023 0323     Coagulation Profile: Recent Labs  Lab 01/03/24 1449  INR 2.9*    Cardiac Enzymes: No results for input(s): "CKTOTAL", "CKMB", "CKMBINDEX", "TROPONINI" in the last 168 hours.  HbA1C: Hgb A1c MFr Bld  Date/Time Value Ref Range Status  12/07/2023 05:51 AM 5.9 (H) 4.8 - 5.6 % Final    Comment:    (NOTE)         Prediabetes: 5.7 - 6.4         Diabetes: >6.4         Glycemic control for adults with diabetes: <7.0   12/06/2023 02:16 AM 6.2 (H) 4.8 - 5.6 % Final    Comment:    (NOTE)         Prediabetes: 5.7 - 6.4         Diabetes: >6.4         Glycemic control for adults with diabetes: <7.0     CBG: No results for input(s): "GLUCAP" in the last 168 hours.  Review of Systems:   As per HPI  Past Medical History:  He,  has a past medical history of Acute on chronic respiratory failure with hypoxia ([name]) (04/21/2021), Acute on chronic systolic (congestive) heart failure ([name]) (02/26/2020),  Amiodarone  toxicity, Anemia, Atrial flutter ([name]), Biventricular congestive heart failure ([name]), Candida parapsilosis infection (12/26/2023), Chronic hypoxemic respiratory failure ([name]), Class 3 severe obesity due to excess calories with serious comorbidity and body mass index (BMI) of 50.0 to 59.9 in adult (02/26/2020), ESRD on hemodialysis [facility]), Essential hypertension (02/26/2020), GERD without esophagitis (02/26/2020), Hidradenitis suppurativa (02/26/2020), NICM (nonischemic cardiomyopathy) ([name]), Obesity hypoventilation syndrome ([name]), OSA (obstructive sleep apnea), PAF (paroxysmal atrial fibrillation) ([name]), Pneumonia, and Prediabetes (02/26/2020).   Surgical History:   Past Surgical History:  Procedure Laterality Date   ABSCESS DRAINAGE     AV FISTULA PLACEMENT Left 08/21/2021   Procedure: LEFT ARM ARTERIOVENOUS (AV) FISTULA.;  Surgeon: Margherita Shell, MD;  Location: [facility] OR;  Service: Vascular;  Laterality: Left;   CARDIAC CATHETERIZATION     CARDIOVERSION N/A 10/09/2021   Procedure: CARDIOVERSION;  Surgeon: Darlis Eisenmenger, MD;  Location: [facility] ENDOSCOPY;  Service: Cardiovascular;  Laterality: N/A;   CARDIOVERSION N/A 05/28/2022   Procedure: CARDIOVERSION;  Surgeon: Darlis Eisenmenger, MD;  Location: [facility] ENDOSCOPY;  Service: Cardiovascular;  Laterality: N/A;   CARDIOVERSION N/A 06/07/2022   Procedure: CARDIOVERSION;  Surgeon: Darlis Eisenmenger, MD;  Location: [facility] ENDOSCOPY;  Service: Cardiovascular;  Laterality: N/A;   IR FLUORO GUIDE CV LINE RIGHT  03/10/2021   IR FLUORO GUIDE CV LINE RIGHT  04/22/2021   IR FLUORO GUIDE CV LINE RIGHT  08/20/2021   IR FLUORO GUIDE CV LINE RIGHT  03/01/2023   IR FLUORO GUIDE CV LINE RIGHT  03/09/2023   IR FLUORO GUIDE CV LINE RIGHT  05/30/2023   IR FLUORO GUIDE CV LINE RIGHT  11/10/2023   IR FLUORO GUIDE CV LINE RIGHT  12/15/2023   IR PTA VENOUS EXCEPT DIALYSIS CIRCUIT  03/09/2023   IR REMOVAL TUN CV CATH W/O FL  05/26/2023   IR REMOVAL TUN CV CATH W/O FL   11/07/2023   IR REMOVAL TUN CV CATH W/O FL  12/13/2023   IR REMOVE CV FIBRIN SHEATH  03/09/2023   IR US  GUIDE VASC ACCESS RIGHT  03/10/2021   IR US  GUIDE VASC ACCESS RIGHT  04/22/2021   IR US  GUIDE VASC ACCESS RIGHT  05/30/2023   IR US  GUIDE VASC ACCESS RIGHT  11/10/2023   IR US  GUIDE VASC ACCESS RIGHT  12/15/2023   RIGHT HEART CATH N/A 03/06/2021   Procedure: RIGHT HEART CATH;  Surgeon: Mardell Shade, MD;  Location: [facility] INVASIVE CV LAB;  Service: Cardiovascular;  Laterality: N/A;   RIGHT/LEFT HEART CATH AND CORONARY ANGIOGRAPHY N/A 03/04/2020   Procedure: RIGHT/LEFT HEART CATH AND CORONARY ANGIOGRAPHY;  Surgeon: Darlis Eisenmenger, MD;  Location: [facility] INVASIVE CV LAB;  Service: Cardiovascular;  Laterality: N/A;   TEE WITHOUT CARDIOVERSION N/A 05/05/2021   Procedure: TRANSESOPHAGEAL ECHOCARDIOGRAM (TEE);  Surgeon: Darlis Eisenmenger, MD;  Location: [facility] ENDOSCOPY;  Service: Cardiovascular;  Laterality: N/A;   TEMPORARY DIALYSIS CATHETER  03/06/2021   Procedure: TEMPORARY DIALYSIS CATHETER;  Surgeon: Mardell Shade, MD;  Location: [facility] INVASIVE CV LAB;  Service: Cardiovascular;;     Social History:   reports that he quit smoking about 6 years ago. His smoking use included cigarettes. He has been exposed to tobacco smoke. He has quit using smokeless tobacco. He reports that he does not drink alcohol  and does not use drugs.   Family History:  His family history includes Drug abuse in his father; Heart disease in his mother; Hypertension in his mother; Pulmonary Hypertension in his mother.   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 Authorizing Provider  apixaban  (ELIQUIS ) 5 MG TABS tablet Take 1 tablet (5 mg total) by mouth 2 (two) times daily. 06/03/23   Carleen Chary, DO  camphor-menthol  [facility]) lotion Apply topically as needed for itching. 12/15/23   Ronni Colace, DO  cinacalcet  (SENSIPAR ) 30 MG tablet Take 1 tablet (30 mg total) by mouth every dialysis. Patient taking differently: Take 30 mg by mouth every Monday, Wednesday, and Friday. 09/12/23   Sheree Dieter, MD  clindamycin  (CLINDAGEL ) 1 % gel Apply topically 2 (two) times daily. Patient taking differently: Apply 1 Application topically 2 (two) times daily. 10/06/23   Carleen Chary, DO  Darbepoetin Alfa  (ARANESP ) 150 MCG/0.3ML SOSY injection To be given during dialysis by [facility] per Nephrology. Not to be given to patient at SNF. 09/06/23   Sheree Dieter, MD  digoxin  (LANOXIN ) 0.125 MG tablet Take 1 tablet (0.125 mg total) by mouth 3 (three) times a week. Monday, Wednesday, Friday (dialysis days) 11/14/23   [name], MD  fentaNYL  (DURAGESIC ) 12 MCG/HR Place 1 patch onto the skin every 3 (three) days. 12/15/23   Ronni Colace, DO  levothyroxine  (SYNTHROID ) 25 MCG tablet Take 1 tablet (25 mcg total) by mouth daily at 6 (six) AM. 11/12/23   [name], MD  midodrine  (PROAMATINE ) 10 MG tablet Take 3 tablets (30 mg total) by mouth 3 (three) times daily with meals. 06/20/23   Setzer, Sandra J, PA-C  naloxone [facility]) 2 MG/2ML injection Inject 2 mg into the skin as needed. Patient not taking: Reported on  11/03/2023    [provider]  Oxycodone  HCl 10 MG TABS Take 1 tablet (10 mg total) by mouth every 6 (six) hours as needed. 12/15/23   Ronni Colace, DO  polyethylene glycol (MIRALAX  / GLYCOLAX ) 17 g packet Take 17 g by mouth daily. 12/16/23   Ronni Colace, DO   pregabalin  (LYRICA ) 50 MG capsule Take 1 capsule (50 mg total) by mouth daily. Take one capsule daily. 12/15/23   Ronni Colace, DO  senna (SENOKOT) 8.6 MG TABS tablet Take 1 tablet (8.6 mg total) by mouth daily. 12/16/23   Ronni Colace, DO  sevelamer  carbonate (RENVELA ) 800 MG tablet Take 2 tablets (1,600 mg total) by mouth 3 (three) times daily with meals. Patient taking differently: Take 2,400 mg by mouth 3 (three) times daily with meals. 11/12/23   [name], MD  vancomycin  1,000 mg in sodium chloride  0.9 % 100 mL Inject 1,000 mg into the vein every Monday, Wednesday, and Friday with hemodialysis. Stop date: 01/23/24 12/16/23 01/23/24  Orson Blalock, NP  venlafaxine  XR (EFFEXOR  XR) 37.5 MG 24 hr capsule Take 1 capsule (37.5 mg total) by mouth daily. 11/12/23 11/11/24  [name], MD  vitamin D3 (CHOLECALCIFEROL ) 25 MCG tablet Take 0.5 tablets (500 Units total) by mouth daily. 11/12/23   [name], MD  zinc  sulfate, 50mg  elemental zinc , 220 (50 Zn) MG capsule Take 1 capsule (220 mg total) by mouth daily. 12/16/23   Ronni Colace, DO     Critical care time: 55 mins       Early Glisson, MSN, AG-ACNP-BC [facility] 01/03/2024, 5:39 PM  See Amion for pager If no response to pager , please call [phone] until 7pm After 7:00 pm call Elink  [phone]

## 2024-01-03 NOTE — ED Notes (Signed)
 Blood obtained,

## 2024-01-03 NOTE — Telephone Encounter (Signed)
 Copied from CRM [id number]. Topic: Appointments - Scheduling Inquiry for Clinic >> Jan 03, 2024  2:04 PM Madelyne Schiff wrote: Patient mother called , did not want to cancel patients appointment, stated patient is heading to [facility] ER and wanted Dr Diann Forth to know.

## 2024-01-04 ENCOUNTER — Ambulatory Visit: Payer: Self-pay | Admitting: Internal Medicine

## 2024-01-04 DIAGNOSIS — I5021 Acute systolic (congestive) heart failure: Secondary | ICD-10-CM | POA: Diagnosis not present

## 2024-01-04 DIAGNOSIS — R579 Shock, unspecified: Secondary | ICD-10-CM | POA: Diagnosis not present

## 2024-01-04 LAB — RESPIRATORY PANEL BY PCR

## 2024-01-04 LAB — COMPREHENSIVE METABOLIC PANEL WITH GFR
ALT: 33 U/L (ref 0–44)
AST: 86 U/L — ABNORMAL HIGH (ref 15–41)
Albumin: 2 g/dL — ABNORMAL LOW (ref 3.5–5.0)
Alkaline Phosphatase: 333 U/L — ABNORMAL HIGH (ref 38–126)
Anion gap: 16 — ABNORMAL HIGH (ref 5–15)
BUN: 52 mg/dL — ABNORMAL HIGH (ref 6–20)
CO2: 22 mmol/L (ref 22–32)
Calcium: 9.1 mg/dL (ref 8.9–10.3)
Chloride: 94 mmol/L — ABNORMAL LOW (ref 98–111)
Creatinine, Ser: 5.45 mg/dL — ABNORMAL HIGH (ref 0.61–1.24)
GFR, Estimated: 13 mL/min — ABNORMAL LOW (ref >60)
Glucose, Bld: 88 mg/dL (ref 70–99)
Potassium: 5.8 mmol/L — ABNORMAL HIGH (ref 3.5–5.1)
Sodium: 132 mmol/L — ABNORMAL LOW (ref 135–145)
Total Bilirubin: 13.4 mg/dL — ABNORMAL HIGH (ref 0.0–1.2)
Total Protein: 6.6 g/dL (ref 6.5–8.1)

## 2024-01-04 LAB — RENAL FUNCTION PANEL
Albumin: 2 g/dL — ABNORMAL LOW (ref 3.5–5.0)
Albumin: 2.1 g/dL — ABNORMAL LOW (ref 3.5–5.0)
Anion gap: 15 (ref 5–15)
Anion gap: 13 (ref 5–15)
BUN: 51 mg/dL — ABNORMAL HIGH (ref 6–20)
BUN: 39 mg/dL — ABNORMAL HIGH (ref 6–20)
CO2: 21 mmol/L — ABNORMAL LOW (ref 22–32)
CO2: 22 mmol/L (ref 22–32)
Calcium: 8.9 mg/dL (ref 8.9–10.3)
Calcium: 9.4 mg/dL (ref 8.9–10.3)
Chloride: 95 mmol/L — ABNORMAL LOW (ref 98–111)
Chloride: 96 mmol/L — ABNORMAL LOW (ref 98–111)
Creatinine, Ser: 5.37 mg/dL — ABNORMAL HIGH (ref 0.61–1.24)
Creatinine, Ser: 3.98 mg/dL — ABNORMAL HIGH (ref 0.61–1.24)
GFR, Estimated: 13 mL/min — ABNORMAL LOW (ref >60)
GFR, Estimated: 19 mL/min — ABNORMAL LOW (ref >60)
Glucose, Bld: 89 mg/dL (ref 70–99)
Glucose, Bld: 96 mg/dL (ref 70–99)
Phosphorus: 7 mg/dL — ABNORMAL HIGH (ref 2.5–4.6)
Phosphorus: 5.3 mg/dL — ABNORMAL HIGH (ref 2.5–4.6)
Potassium: 5.7 mmol/L — ABNORMAL HIGH (ref 3.5–5.1)
Potassium: 5.4 mmol/L — ABNORMAL HIGH (ref 3.5–5.1)
Sodium: 131 mmol/L — ABNORMAL LOW (ref 135–145)
Sodium: 131 mmol/L — ABNORMAL LOW (ref 135–145)

## 2024-01-04 LAB — CBC
HCT: 28.4 % — ABNORMAL LOW (ref 39.0–52.0)
Hemoglobin: 9.4 g/dL — ABNORMAL LOW (ref 13.0–17.0)
MCH: 29.3 pg (ref 26.0–34.0)
MCHC: 33.1 g/dL (ref 30.0–36.0)
MCV: 88.5 fL (ref 80.0–100.0)
Platelets: 295 10*3/uL (ref 150–400)
RBC: 3.21 MIL/uL — ABNORMAL LOW (ref 4.22–5.81)
RDW: 17.2 % — ABNORMAL HIGH (ref 11.5–15.5)
WBC: 18.7 10*3/uL — ABNORMAL HIGH (ref 4.0–10.5)
nRBC: 0 % (ref 0.0–0.2)

## 2024-01-04 LAB — MAGNESIUM: Magnesium: 2.3 mg/dL (ref 1.7–2.4)

## 2024-01-04 MED ORDER — PRISMASOL BGK 2/3.5 32-2-3.5 MEQ/L EC SOLN: Status: DC

## 2024-01-04 MED ORDER — VANCOMYCIN HCL 1750 MG/350ML IV SOLN
1750.0000 mg | INTRAVENOUS | Status: DC
  Administered 2024-01-05 – 2024-01-08 (×4): 1750 mg via INTRAVENOUS
  Filled 2024-01-04 (×4): qty 350

## 2024-01-04 MED ORDER — MIDODRINE HCL 5 MG PO TABS
30.0000 mg | ORAL_TABLET | Freq: Three times a day (TID) | ORAL | Status: DC
  Administered 2024-01-04 – 2024-01-10 (×18): 30 mg via ORAL
  Filled 2024-01-04 (×19): qty 6

## 2024-01-04 MED ORDER — VANCOMYCIN HCL 10 G IV SOLR
2500.0000 mg | Freq: Once | INTRAVENOUS | Status: AC
  Administered 2024-01-04: 2500 mg via INTRAVENOUS
  Filled 2024-01-04: qty 2500

## 2024-01-04 MED ORDER — RENA-VITE PO TABS
1.0000 | ORAL_TABLET | Freq: Every day | ORAL | Status: DC
  Administered 2024-01-04 – 2024-01-14 (×11): 1 via ORAL
  Filled 2024-01-04 (×12): qty 1

## 2024-01-04 MED ORDER — PRISMASOL BGK 0/2.5 32-2.5 MEQ/L EC SOLN: Status: DC

## 2024-01-04 MED ORDER — PANTOPRAZOLE SODIUM 40 MG PO TBEC
40.0000 mg | DELAYED_RELEASE_TABLET | Freq: Every day | ORAL | Status: DC
  Administered 2024-01-04: 40 mg via ORAL
  Filled 2024-01-04: qty 1

## 2024-01-04 NOTE — Plan of Care (Signed)
  Problem: Education: Goal: Knowledge of General Education information will improve Description: Including pain rating scale, medication(s)/side effects and non-pharmacologic comfort measures 01/04/2024 2049 by Clora Dane, RN Outcome: Progressing 01/04/2024 2049 by Clora Dane, RN Outcome: Progressing   Problem: Health Behavior/Discharge Planning: Goal: Ability to manage health-related needs will improve 01/04/2024 2049 by Clora Dane, RN Outcome: Progressing 01/04/2024 2049 by Clora Dane, RN Outcome: Progressing   Problem: Clinical Measurements: Goal: Ability to maintain clinical measurements within normal limits will improve 01/04/2024 2049 by Clora Dane, RN Outcome: Progressing 01/04/2024 2049 by Clora Dane, RN Outcome: Progressing Goal: Will remain free from infection 01/04/2024 2049 by Clora Dane, RN Outcome: Progressing 01/04/2024 2049 by Clora Dane, RN Outcome: Progressing Goal: Diagnostic test results will improve 01/04/2024 2049 by Clora Dane, RN Outcome: Progressing 01/04/2024 2049 by Clora Dane, RN Outcome: Progressing Goal: Respiratory complications will improve 01/04/2024 2049 by Clora Dane, RN Outcome: Progressing 01/04/2024 2049 by Clora Dane, RN Outcome: Progressing Goal: Cardiovascular complication will be avoided 01/04/2024 2049 by Clora Dane, RN Outcome: Progressing 01/04/2024 2049 by Clora Dane, RN Outcome: Progressing   Problem: Activity: Goal: Risk for activity intolerance will decrease Outcome: Progressing

## 2024-01-04 NOTE — Progress Notes (Addendum)
 Pharmacy Antibiotic Note  Phillip Young is a 38 y.o. male admitted on 01/03/2024 with hx of MRSE bacteremia presenting with new hypotension, initial concern for sepsis.  Pharmacy has been consulted for vancomycin  dosing. Patient ERSD, now on CRRT.   Plan: Patient receiving IV vancomycin  750mg  q HD at OSH, last dose 6/6 due to patient refusing 6/9 dose.  Received 1x dose of linezolid  in ED, will give 2500mg  of IV vancomycin  followed by 1750mg  q24h while on CRRT - planned EOT 6/30 per ID for presumed MRSE endocarditis.  Monitor for transition back to HD to adjust vancomycin .   Height: 6' 2 (188 cm) Weight: (!) 176.3 kg (388 lb 10.7 oz) IBW/kg (Calculated) : 82.2  Temp (24hrs), Avg:98.4 F (36.9 C), Min:97.8 F (36.6 C), Max:99.1 F (37.3 C)  Recent Labs  Lab 01/03/24 1449 01/03/24 1508 01/04/24 0250 01/04/24 0251  WBC 20.6*  --  18.7*  --   CREATININE 6.92*  --  5.45* 5.37*  LATICACIDVEN  --  3.8*  --   --     Estimated Creatinine Clearance: 31.6 mL/min (A) (by C-G formula based on SCr of 5.37 mg/dL (H)).    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, SRA positive   Metoprolol  Other (See Comments)    near syncope   Other Swelling and Other (See Comments)    Steroids Fluid seeping out of legs Nebulizer(unknown name of neb) elevated pressure    Tramadol      unknown    Antimicrobials this admission: Vancomycin  5/14 >> planned EOT 6/30  Cefepime  6/10 >> 6/11   Flagyl  6/10 >> 6/11  Micafungin 5/24 >> 6/5 at OSH, 6/11 >  S/p 1x linezolid  6/10  Microbiology results: 6/10 BCx: NGTD x < 24h  6/10 MRSA PCR: negative  5/25 Bcx: NGTD x final (OSH)  5/20 Bcx: 1/2 Candida tropicalis, Candida parapsilosis  5/13 Bcx: MRSE 2/2   Thank you for  allowing pharmacy to be a part of this patient's care.Aaron Aas  Mamie Searles, PharmD, BCCCP  01/04/2024 8:40 AM

## 2024-01-04 NOTE — Progress Notes (Signed)
 [facility] NEPHROLOGY PROGRESS NOTE  Assessment/ Plan: Pt is a 38 y.o. yo male with past medical history of biventricular heart failure EF less than 20, chronic A-fib, ESRD on HD, chronic hypotension on high-dose midodrine , nonadherence with outpatient HD and treatment, recently at [facility] presented with shock and volume overload.  # Shock cardiac versus septic: On antibiotics and UF with HD.  Currently on Levophed  with dose titration.  # ESRD MWF, noncompliant with outpatient HD with chronic hyperkalemia.  Currently on CRRT, UF as tolerated.  # Hyperkalemia: Currently on 4K bath, follow labs.  # Anemia of ESRD: Dose Aranesp .  # Secondary hyperparathyroidism/hyperphosphatemia: Currently on CRRT.  Follow labs.  # Chronic hypotension/volume: On Levophed  for shock and receiving midodrine .  Subjective: Seen and examined.  Patient is receiving CRRT well, on Levophed .  Feels generalized body pain Objective Vital signs in last 24 hours: Vitals:   01/04/24 0845 01/04/24 0915 01/04/24 0930 01/04/24 0945  BP: (!) 81/55 99/72 104/84 106/69  Pulse: (!) 116 (!) 119 (!) 122 (!) 129  Resp: (!) 23 (!) 30 (!) 26 (!) 26  Temp:      TempSrc:      SpO2: (!) 89% 92% 91% 94%  Weight:      Height:       Weight change:   Intake/Output Summary (Last 24 hours) at 01/04/2024 1118 Last data filed at 01/04/2024 1000 Gross per 24 hour  Intake 2510.01 ml  Output 1940 ml  Net 570.01 ml       Labs: RENAL PANEL Recent Labs  Lab 01/03/24 1449 01/04/24 0250 01/04/24 0251  NA 129* 132* 131*  K 6.3* 5.8* 5.7*  CL 89* 94* 95*  CO2 24 22 21*  GLUCOSE 93 88 89  BUN 61* 52* 51*  CREATININE 6.92* 5.45* 5.37*  CALCIUM  9.2 9.1 8.9  MG  --  2.3  --   PHOS  --   --  7.0*  ALBUMIN  1.7* 2.0* 2.0*    Liver Function Tests: Recent Labs  Lab 01/03/24 1449 01/04/24 0250 01/04/24 0251  AST 84* 86*  --   ALT 32 33  --   ALKPHOS 346* 333*  --   BILITOT 12.0* 13.4*  --   PROT 6.4* 6.6   --   ALBUMIN  1.7* 2.0* 2.0*   No results for input(s): LIPASE, AMYLASE in the last 168 hours. No results for input(s): AMMONIA in the last 168 hours. CBC: Recent Labs    02/21/23 1538 02/22/23 0209 04/14/23 0314 04/15/23 1054 05/19/23 2020 05/20/23 1422 08/31/23 0348 09/01/23 0343 12/13/23 0447 12/14/23 0418 12/15/23 0321 01/03/24 1449 01/04/24 0250  HGB  --    < >  --    < >  --    < > 10.3*   < > 10.9* 11.4* 10.2* 9.7* 9.4*  MCV  --    < >  --    < >  --    < > 100.6*   < > 100.0 100.3* 101.8* 89.6 88.5  VITAMINB12  --   --  1,014*  --   --   --  815  --   --   --   --   --   --   FOLATE 12.0  --   --   --   --   --  7.9  --   --   --   --   --   --   FERRITIN  --   --   --   --  359*  --  241  --   --   --   --   --   --   TIBC  --   --   --   --  211*  --  364  --   --   --   --   --   --   IRON  --   --   --   --  64  --  58  --   --   --   --   --   --    < > = values in this interval not displayed.    Cardiac Enzymes: No results for input(s): CKTOTAL, CKMB, CKMBINDEX, TROPONINI in the last 168 hours. CBG: Recent Labs  Lab 01/03/24 1740  GLUCAP 128*    Iron Studies: No results for input(s): IRON, TIBC, TRANSFERRIN, FERRITIN in the last 72 hours. Studies/Results: US  Abdomen Limited RUQ (LIVER/GB) Result Date: 01/03/2024 CLINICAL DATA:  Jaundice EXAM: ULTRASOUND ABDOMEN LIMITED RIGHT UPPER QUADRANT COMPARISON:  CT today FINDINGS: Gallbladder: Gallbladder is contracted. No visible stones. Negative sonographic Murphy sign. Common bile duct: Diameter: Normal caliber, 2 mm. Liver: No focal lesion identified. Within normal limits in parenchymal echogenicity. Portal vein is patent on color Doppler imaging with normal direction of blood flow towards the liver. Other: None. IMPRESSION: No acute findings. Contracted gallbladder. Electronically Signed   By: Janeece Mechanic M.D.   On: 01/03/2024 19:39   CT CHEST ABDOMEN PELVIS W CONTRAST Result Date:  01/03/2024 CLINICAL DATA:  Sepsis. EXAM: CT CHEST, ABDOMEN, AND PELVIS WITH CONTRAST TECHNIQUE: Multidetector CT imaging of the chest, abdomen and pelvis was performed following the standard protocol during bolus administration of intravenous contrast. RADIATION DOSE REDUCTION: This exam was performed according to the departmental dose-optimization program which includes automated exposure control, adjustment of the mA and/or kV according to patient size and/or use of iterative reconstruction technique. CONTRAST:  OMNIPAQUE  IOHEXOL  350 MG/ML SOLN COMPARISON:  Chest radiograph dated 01/03/2024 and CT pelvis dated 11/01/2023. FINDINGS: Evaluation of this exam is limited due to respiratory motion as well as streak artifact caused by patient's arms. CT CHEST FINDINGS Cardiovascular: Mild chronic pericardial effusion adjacent to the right atrium measures 2 cm in thickness. Central venous catheter with tip in the right atrium. The thoracic aorta is unremarkable. The origins of the great vessels of the aortic arch and the central pulmonary arteries are patent. Mediastinum/Nodes: No obvious hilar or mediastinal adenopathy. The esophagus is grossly unremarkable. No mediastinal fluid collection. Lungs/Pleura: Interstitial and interlobular septal prominence with diffuse ground-glass density throughout the lungs most consistent with edema. Pneumonia is not excluded. No pleural effusion or pneumothorax. The central airways are patent. Musculoskeletal: No acute osseous pathology. CT ABDOMEN PELVIS FINDINGS No intra-abdominal free air or free fluid. Hepatobiliary: The liver is unremarkable. No biliary dilatation. The gallbladder is from the visualized and appears contracted. No calcified gallstone. Pancreas: Unremarkable. No pancreatic ductal dilatation or surrounding inflammatory changes. Spleen: Normal in size without focal abnormality. Adrenals/Urinary Tract: The adrenal glands are unremarkable. There is no hydronephrosis  on either side. The urinary bladder is collapsed. Stomach/Bowel: There is no bowel obstruction. The appendix is normal. Vascular/Lymphatic: Mild aortoiliac atherosclerotic disease. The IVC is unremarkable. No portal venous gas. There is no adenopathy. Reproductive: The prostate and seminal vesicles are grossly unremarkable. No pelvic mass. Other: There is diffuse subcutaneous edema.  No fluid collection. Musculoskeletal: Irregular appearance of the subcutaneous fat superficial to the sacrum similar to prior CT  may be sequela of prior inflammatory/infectious process or represent areas of fat necrosis. No fluid collection. No acute osseous pathology. IMPRESSION: 1. Pulmonary edema. Pneumonia is not excluded. 2. No bowel obstruction. Normal appendix. 3. No fluid collection. 4.  Aortic Atherosclerosis (ICD10-I70.0). Electronically Signed   By: Angus Bark M.D.   On: 01/03/2024 16:44   DG Chest 2 View Result Date: 01/03/2024 CLINICAL DATA:  Sepsis. EXAM: CHEST - 2 VIEW COMPARISON:  Chest x-ray 12/11/2023 and older FINDINGS: Markedly limited examination. The lateral view is nondiagnostic. Frontal view is under penetrated. Enlarged cardiopericardial silhouette as on prior with vascular congestion. Increasing pleural effusions. No pneumothorax. Right IJ double-lumen catheter with tip overlying the right atrium. Overlapping cardiac leads. IMPRESSION: Extremely limited examination as above. The lateral view is nondiagnostic. Enlarged cardiopericardial silhouette with increasing vascular congestion and pleural effusions. Right IJ double-lumen catheter with tip lower along the mediastinum, possibly right atrial. Please correlate with history. Electronically Signed   By: Adrianna Horde M.D.   On: 01/03/2024 15:17    Medications: Infusions:  sodium chloride  Stopped (01/04/24 0830)   albumin  human 60 mL/hr at 01/04/24 1000   anticoagulant sodium citrate      norepinephrine  (LEVOPHED ) Adult infusion 5 mcg/min (01/04/24  1000)   prismasol  BGK 4/2.5 750 mL/hr at 01/04/24 1015   prismasol  BGK 4/2.5 2,000 mL/hr at 01/04/24 0906   prismasol  BGK 4/2.5 400 mL/hr at 01/04/24 1610   vancomycin  2,500 mg (01/04/24 1101)   Followed by   Cecily Cohen ON 01/05/2024] vancomycin       Scheduled Medications:  Chlorhexidine  Gluconate Cloth  6 each Topical Daily   leptospermum manuka honey  1 Application Topical Daily   levothyroxine   25 mcg Oral Q0600   liver oil-zinc  oxide   Topical BID   midodrine   30 mg Oral Q8H   mouth rinse  15 mL Mouth Rinse 4 times per day   pantoprazole   40 mg Oral QHS   pregabalin   50 mg Oral Daily   venlafaxine  XR  37.5 mg Oral Daily   zinc  sulfate (50mg  elemental zinc )  220 mg Oral Daily    have reviewed scheduled and prn medications.  Physical Exam: General:NAD, comfortable Heart:RRR, s1s2 nl Lungs: Bilateral distant breath sound Abdomen:soft, distended Extremities: Peripheral edema Dialysis Access: TDC  Georgeanna Radziewicz Prasad Roselee Tayloe 01/04/2024,11:19 AM  LOS: 1 day

## 2024-01-04 NOTE — TOC Initial Note (Signed)
 Transition of Care [facility]) - Initial/Assessment Note    Patient Details  Name: Phillip Young MRN: [id number] Date of Birth: [birth year]  [facility] CM/SW Contact:    Jeani Mill, RN Phone Number: 01/04/2024, 3:53 PM  Clinical Narrative:                 Spoke to patient and mother (by phone) regarding transition needs.  Patient recently discharged from [facility] and is living with mother. Patient has hospital bed, wheelchair, hoyer lift, walker. Patient's PCP is working on getting patient a motorized wheelchair. Mother states patient has applied for medicaid. This RNCM sent secure message to [facility]. Patient is active with [facility] and has home 02 from [place]. Mother states patient needs 10L concentrator. Sent secure email to Jermaine with [employer]. Address, Phone number and PCP verified. TOC will continue to follow for needs.  Expected Discharge Plan: Home w Home Health Services Barriers to Discharge: Continued Medical Work up   Patient Goals and CMS Choice Patient states their goals for this hospitalization and ongoing recovery are:: return home          Expected Discharge Plan and Services       Living arrangements for the past 2 months: Apartment                             HH Agency: [facility]        Prior Living Arrangements/Services Living arrangements for the past 2 months: Apartment Lives with:: Parents Patient language and need for interpreter reviewed:: Yes Do you feel safe going back to the place where you live?: Yes      Need for Family Participation in Patient Care: Yes (Comment) Care giver support system in place?: Yes (comment) Current home services: DME (walker, wheelchair, hospital bed, hoyer lift,) Criminal Activity/Legal Involvement Pertinent to Current Situation/Hospitalization: No - Comment as needed  Activities of Daily Living      Permission Sought/Granted                  Emotional  Assessment Appearance:: Appears stated age     Orientation: : Oriented to Self, Oriented to Place, Oriented to  Time, Oriented to Situation Alcohol  / Substance Use: Not Applicable Psych Involvement: No (comment)  Admission diagnosis:  Shock ([name]) [R57.9] Patient Active Problem List   Diagnosis Date Noted   Candida parapsilosis infection 12/26/2023   Staphylococcus epidermidis sepsis ([name]) 12/10/2023   Anasarca associated with disorder of kidney 12/10/2023   Bacteremia associated with intravascular line ([name]) 12/07/2023   Shock ([name]) 12/06/2023   At risk for central line-associated bloodstream infection (CLABSI) 11/03/2023   Enterococcal bacteremia 11/03/2023   Bacteremia due to methicillin resistant Staphylococcus epidermidis 11/03/2023   Pressure injury of skin 11/03/2023   Sacral wound 10/12/2023   Chronic, continuous use of opioids 10/06/2023   Acute encephalopathy 09/22/2023   ESRD (end stage renal disease) ([name]) 09/10/2023   Paronychia of great toe of right foot 09/09/2023   Ingrown nail of great toe of right foot 09/09/2023   Ingrown toenail of right foot 09/08/2023   Atrial fibrillation with RVR ([name]) 09/07/2023   Encounter for screening involving social determinants of health (SDoH) 08/30/2023   Weakness of right foot 08/25/2023   ESRD needing dialysis ([name]) 07/02/2023   Hoarseness of voice 06/21/2023   Adjustment disorder with mixed anxiety and depressed mood 06/10/2023   Metabolic encephalopathy 06/07/2023   Severe  muscle deconditioning 06/03/2023   HFrEF (heart failure with reduced ejection fraction) ([name]) 06/01/2023   Neuropathy 05/28/2023   MSSA bacteremia 05/26/2023   Hyperkalemia 03/21/2023   Central line infection 03/10/2023   Pressure injury 01/23/2023   Acute hypoxic respiratory failure ([name]) 01/21/2023   Atrial fibrillation, chronic ([name]) 01/21/2023   Acute on chronic hypoxic respiratory failure ([name]) 08/07/2022   Congestive heart failure ([name])  08/05/2022   Elevated liver enzymes 06/08/2022   Multiple nodules of lung 05/31/2022   History of Gram positive sepsis    Advanced care planning/counseling discussion    Recurrent infections 09/30/2021   Reactive depression 09/02/2021   Hypervolemia 08/18/2021   Type 2 diabetes mellitus with diabetic chronic kidney disease ([name]) 06/26/2021   ESRD on dialysis [facility])    Medical non-compliance 04/21/2021   Hypotension 04/21/2021   ESRD (end stage renal disease) on dialysis ([name]) 04/16/2021   Paroxysmal atrial flutter ([name]) 04/12/2021   Chronic hyponatremia 04/12/2021   SOB (shortness of breath)    Cardiogenic shock ([name])    Chronic respiratory failure with hypoxia ([name]) 02/11/2021   Chronic heart failure with reduced ejection fraction (HFrEF, <= 40%) ([name])    Chronic cough    Morbid obesity ([name]) 02/26/2020   GERD without esophagitis 02/26/2020   OSA (obstructive sleep apnea) 02/26/2020   Hidradenitis suppurativa 02/26/2020   PCP:  Carleen Chary, DO Pharmacy:   [id number] [place], [place] - [address] AT [facility] OF [address] & [address] [place] [id number] Phone: [phone] Fax: [phone]  [facility] [address] [place] [address] Phone: [phone] Fax: [phone]  [place] [facility] [address], [address] [place] [address] Phone: [phone] Fax: [phone]  [facility] [place], [place] - [address] [place] [address] Phone: [phone] Fax: [phone]     Social Drivers of Health (SDOH) Social History: SDOH Screenings   Food Insecurity: No Food Insecurity (12/06/2023)  Recent Concern: Food Insecurity - Food Insecurity Present (11/01/2023)  Housing: High Risk (12/06/2023)  Transportation Needs: Unmet Transportation Needs (12/06/2023)  Utilities: At Risk (12/06/2023)  Alcohol   Screen: Low Risk  (10/06/2023)  Depression (PHQ2-9): High Risk (10/06/2023)  Financial Resource Strain: Low Risk  (10/06/2023)  Physical Activity: Insufficiently Active (10/06/2023)  Social Connections: Socially Isolated (10/06/2023)  Stress: No Stress Concern Present (10/06/2023)  Tobacco Use: Medium Risk (12/14/2023)  Health Literacy: Adequate Health Literacy (10/06/2023)   SDOH Interventions:     Readmission Risk Interventions    12/15/2023    2:07 PM 10/13/2023   11:27 AM 09/23/2023    5:05 PM  Readmission Risk Prevention Plan  Transportation Screening Complete Complete Complete  Medication Review (RN Care Manager) Referral to Pharmacy Referral to Pharmacy Referral to Pharmacy  PCP or Specialist appointment within 3-5 days of discharge Complete  Complete  HRI or Home Care Consult Complete Complete Complete  SW Recovery Care/Counseling Consult Complete Complete Complete  Palliative Care Screening Not Applicable Not Applicable Not Applicable  Skilled Nursing Facility Not Applicable Not Applicable Not Applicable

## 2024-01-04 NOTE — Plan of Care (Signed)
  Problem: Clinical Measurements: Goal: Ability to maintain clinical measurements within normal limits will improve Outcome: Progressing Goal: Will remain free from infection Outcome: Progressing Goal: Diagnostic test results will improve Outcome: Progressing Goal: Respiratory complications will improve Outcome: Progressing   Problem: Coping: Goal: Level of anxiety will decrease Outcome: Progressing

## 2024-01-04 NOTE — Progress Notes (Signed)
 Initial Nutrition Assessment  DOCUMENTATION CODES:  Morbid obesity  INTERVENTION:  Continue current diet as ordered Encourage PO intake Renal MVI  NUTRITION DIAGNOSIS:  Increased nutrient needs related to chronic illness as evidenced by estimated needs.  GOAL:  Patient will meet greater than or equal to 90% of their needs  MONITOR:  PO intake, Supplement acceptance, Weight trends, Labs  REASON FOR ASSESSMENT:  Consult Assessment of nutrition requirement/status (CRRT)  ASSESSMENT:  Pt with hx of CHF, COPD, GERD, HTN, morbid obesity, and ESRD on HD presented to ED with concern for wound infections after leaving [facility] the day prior. Pt with recent admission in May for similar presentation.  Pt with other provider at first attempt and patient care at second attempt. Reviewed chart and noted pt was followed by RD team during last admission.   Pt discussed during ICU rounds and with RN and MD. RN reports that pt did not eat much this AM, states he wanted to rest. Continues on CRRT.  Admit weight: 180 kg   Current weight: 176.3 kg EDW: 155.5 kg    Intake/Output Summary (Last 24 hours) at 01/04/2024 1321 Last data filed at 01/04/2024 1300 Gross per 24 hour  Intake 3134.38 ml  Output 3106 ml  Net 28.38 ml  Net IO Since Admission: 28.38 mL [01/04/24 1321]  Drains/Lines: HD Right IJ  Nutritionally Relevant Medications: Scheduled Meds:  levothyroxine   25 mcg Oral Q0600   pantoprazole  IV  40 mg Intravenous Q24H   zinc  sulfate   220 mg Oral Daily   Continuous Infusions:  albumin  human Stopped (01/04/24 0320)   ceFEPime  (MAXIPIME ) IV Stopped (01/04/24 0146)   metronidazole  Stopped (01/04/24 0507)   micafungin (MYCAMINE) 200 mg in sodium chloride  0.9 % 100 mL IVPB     norepinephrine  (LEVOPHED ) Adult infusion 5 mcg/min (01/04/24 0700)   PRN Meds: docusate sodium , polyethylene glycol  Labs Reviewed: Sodium 131, chloride 95 Potassium 5.7 BUN 51, creatinine  5.37 Phosphorus 7.0 CBG ranges from 88-128 mg/dL over the last 24 hours HgbA1c 5.9% (5/14)  NUTRITION - FOCUSED PHYSICAL EXAM: Defer to in-person assessment, exam unable to be performed accurately last admission due to excess fluid and adipose tissue  Diet Order:   Diet Order             Diet renal with fluid restriction Fluid restriction: 1200 mL Fluid; Room service appropriate? Yes; Fluid consistency: Thin  Diet effective now                   EDUCATION NEEDS:  Not appropriate for education at this time  Skin:    Per WOC Note 6/10: Stage 2: - buttocks, scattered Skin Tears: - bilateral inner thighs (presenting with some dark hemorrhagic tissue and some yellow necrotic tissue) - left foot (with necrotic tissue) Full Thickness Wounds: - left distal thigh  Last BM:  PTA  Height:  Ht Readings from Last 1 Encounters:  01/04/24 6' (1.829 m)    Weight:  Wt Readings from Last 1 Encounters:  01/04/24 (!) 176.3 kg    Ideal Body Weight:  80.9 kg  BMI:  Body mass index is 52.71 kg/m.  Estimated Nutritional Needs:  Kcal:  2400-2600 kcal/d Protein:  140-160 g/d Fluid:  1L+UOP    Edwena Graham, RD, LDN Registered Dietitian II Please reach out via secure chat

## 2024-01-04 NOTE — H&P (Signed)
 NAME:  Phillip Young, MRN:  [id number], DOB:  Dec 16, 1985, LOS: 1 ADMISSION DATE:  01/03/2024, CONSULTATION DATE:  01/03/24 REFERRING MD:  Dr. Val Garin, CHIEF COMPLAINT:  SOB/ chills   History of Present Illness:   83 yoM w/PMH as below significant for biventricular HF (EF < 20%), chronic afib on Eliquis , chronic hypotension on midodrine  30mg  TID, ESRD, chronic hypoxic and hypercarbic respiratory failure on NIV, morbid obesity, medical non-compliance, and chronic pain presenting from home after being discharged from [facility] 6/9 with SOB, chills, subjective fever, ongoing poor PO, vomiting today strings of blood but then reported it was with coughing episodes, pain all over, and increased weeping from left thigh blister.  Had BM, unclear on consistency/ color.  Reports last dialysis yesterday.  Initially reports not taking any PO meds today but then said he did this morning.  Non- ambulatory at baseline.  Frequent admits, most recently 5/13-5/22 for recurrent septic shock and MRSE bacteremia due to suspected line complicated by acute on chronic HF and respiratory failure, suspected IE but not a candidate for TEE, discharged on vancomycin  with HD scheduled thru 6/30.  TDC was replaced 5/22 after 2 day line holiday.  Also started on micafungin w/HD at [facility] after [facility] grew candida species, scheduled thru 6/30.   In ER, temp 99.1, on HFNC at 6L, with SBP into 70's requiring NE support after 500 ml bolus.  Labs significant for Na 129, K 6.3, Cl 89, alk phos 346, AST 84, t.bili 12 (recent 3.4 on 5/14), lactic 3.8, WBC 20.6 (previously 8.1 on 5/22), INR 2.9, CXR showing vascular congestion and increasing congestion and pleural effusions.  BC sent and empirically started on linezolid , flagyl , and cefepime .  Pending CT chest/abd/ pelvis.  Nephrology consulted.  PCCM called for admit.   Pertinent  Medical History   Past Medical History:  Diagnosis Date   Acute on chronic respiratory failure with hypoxia ([name])  04/21/2021   Acute on chronic systolic (congestive) heart failure ([name]) 02/26/2020   Amiodarone  toxicity    Anemia    Atrial flutter ([name])    Biventricular congestive heart failure ([name])    Candida parapsilosis infection 12/26/2023   Chronic hypoxemic respiratory failure ([name])    Class 3 severe obesity due to excess calories with serious comorbidity and body mass index (BMI) of 50.0 to 59.9 in adult 02/26/2020   ESRD on hemodialysis [facility])    Essential hypertension 02/26/2020   GERD without esophagitis 02/26/2020   Hidradenitis suppurativa 02/26/2020   NICM (nonischemic cardiomyopathy) ([name])    Obesity hypoventilation syndrome ([name])    OSA (obstructive sleep apnea)    PAF (paroxysmal atrial fibrillation) ([name])    Pneumonia    Prediabetes 02/26/2020   Significant Hospital Events: Including procedures, antibiotic start and stop dates in addition to other pertinent events   6/10 Admit  Interim History / Subjective:  Remains on low-dose vasopressors.  Complains of generalized discomfort.  Objective    Blood pressure (!) 81/55, pulse (!) 116, temperature 98.7 F (37.1 C), temperature source Axillary, resp. rate (!) 23, height 6' (1.829 m), weight (!) 176.3 kg, SpO2 (!) 89%.    Pressure Support:  [10 cmH20] 10 cmH20   Intake/Output Summary (Last 24 hours) at 01/04/2024 0929 Last data filed at 01/04/2024 0800 Gross per 24 hour  Intake 2196.32 ml  Output 1506 ml  Net 690.32 ml   Filed Weights   01/03/24 1518 01/04/24 0328  Weight: (!) 180 kg (!) 176.3 kg   Examination: General: Chronically  ill-appearing obese male lying in bed, appears uncomfortable. HEENT: Dukas membranes are dry.  Voice is hoarse. Neuro: Awake and appropriate with no focal deficits. CV: Heart sounds are unremarkable.  Remains in atrial fibrillation. PULM: Chest is clear to auscultation bilaterally. GI: Abdomen is soft. Extremities: Chronic lower extremity brawny edema. Skin: Skin wounds are clean.    Ancillary test personally reviewed  BNP elevated above baseline. Assessment and Plan   Shock which appears to be predominantly secondary to decompensated HFrEF No clear source of infection at this time. Cholelithiasis without dilatation.  Likely secondary to acute illness. Acute on chronic hypoxic and hypercarbic respiratory failure due to obesity hypoventilation. ESRD on Monday Wednesday Friday hemodialysis. Anemia of renal disease Chronic pain syndrome Hypothyroidism   Plan:  - Complete already started course of antibiotics for prior MRSE bacteremia. - Looks intravascularly contracted.  Hold fluid removal and give trial of albumin . - Titrate norepinephrine  to keep MAP greater than 65. - Continue to monitor hyperbilirubinemia.  Best Practice (right click and Reselect all SmartList Selections daily)   Diet/type: NPO w/ oral meds DVT prophylaxis SCD Pressure ulcer(s): present on admission  GI prophylaxis: PPI Lines: Dialysis Catheter-pta R TDC (12/15/23) Foley:  N/A Code Status:  full code Last date of multidisciplinary goals of care discussion [6/10]  CRITICAL CARE Performed by: Arlina Lair   Total critical care time: 45 minutes  Critical care time was exclusive of separately billable procedures and treating other patients.  Critical care was necessary to treat or prevent imminent or life-threatening deterioration.  Critical care was time spent personally by me on the following activities: development of treatment plan with patient and/or surrogate as well as nursing, discussions with consultants, evaluation of patient's response to treatment, examination of patient, obtaining history from patient or surrogate, ordering and performing treatments and interventions, ordering and review of laboratory studies, ordering and review of radiographic studies, pulse oximetry, re-evaluation of patient's condition and participation in multidisciplinary rounds.  Arlina Lair, MD  [facility] ICU Physician [facility] Critical Care  Pager: [phone] Mobile: [phone] After hours: [phone].

## 2024-01-05 ENCOUNTER — Encounter: Admitting: Internal Medicine

## 2024-01-05 DIAGNOSIS — I4821 Permanent atrial fibrillation: Secondary | ICD-10-CM | POA: Diagnosis not present

## 2024-01-05 DIAGNOSIS — I5021 Acute systolic (congestive) heart failure: Secondary | ICD-10-CM | POA: Diagnosis not present

## 2024-01-05 DIAGNOSIS — R579 Shock, unspecified: Secondary | ICD-10-CM | POA: Diagnosis not present

## 2024-01-05 LAB — RENAL FUNCTION PANEL
Albumin: 2.1 g/dL — ABNORMAL LOW (ref 3.5–5.0)
Albumin: 2 g/dL — ABNORMAL LOW (ref 3.5–5.0)
Anion gap: 12 (ref 5–15)
Anion gap: 14 (ref 5–15)
BUN: 29 mg/dL — ABNORMAL HIGH (ref 6–20)
BUN: 24 mg/dL — ABNORMAL HIGH (ref 6–20)
CO2: 21 mmol/L — ABNORMAL LOW (ref 22–32)
CO2: 22 mmol/L (ref 22–32)
Calcium: 8.9 mg/dL (ref 8.9–10.3)
Calcium: 8.7 mg/dL — ABNORMAL LOW (ref 8.9–10.3)
Chloride: 98 mmol/L (ref 98–111)
Chloride: 95 mmol/L — ABNORMAL LOW (ref 98–111)
Creatinine, Ser: 3.05 mg/dL — ABNORMAL HIGH (ref 0.61–1.24)
Creatinine, Ser: 2.46 mg/dL — ABNORMAL HIGH (ref 0.61–1.24)
GFR, Estimated: 26 mL/min — ABNORMAL LOW (ref >60)
GFR, Estimated: 34 mL/min — ABNORMAL LOW (ref >60)
Glucose, Bld: 102 mg/dL — ABNORMAL HIGH (ref 70–99)
Glucose, Bld: 91 mg/dL (ref 70–99)
Phosphorus: 4.3 mg/dL (ref 2.5–4.6)
Phosphorus: 3.7 mg/dL (ref 2.5–4.6)
Potassium: 4.7 mmol/L (ref 3.5–5.1)
Potassium: 4.5 mmol/L (ref 3.5–5.1)
Sodium: 131 mmol/L — ABNORMAL LOW (ref 135–145)
Sodium: 131 mmol/L — ABNORMAL LOW (ref 135–145)

## 2024-01-05 LAB — COMPREHENSIVE METABOLIC PANEL WITH GFR
ALT: 33 U/L (ref 0–44)
AST: 86 U/L — ABNORMAL HIGH (ref 15–41)
Albumin: 2.1 g/dL — ABNORMAL LOW (ref 3.5–5.0)
Alkaline Phosphatase: 324 U/L — ABNORMAL HIGH (ref 38–126)
Anion gap: 9 (ref 5–15)
BUN: 28 mg/dL — ABNORMAL HIGH (ref 6–20)
CO2: 23 mmol/L (ref 22–32)
Calcium: 8.8 mg/dL — ABNORMAL LOW (ref 8.9–10.3)
Chloride: 98 mmol/L (ref 98–111)
Creatinine, Ser: 3 mg/dL — ABNORMAL HIGH (ref 0.61–1.24)
GFR, Estimated: 26 mL/min — ABNORMAL LOW (ref >60)
Glucose, Bld: 100 mg/dL — ABNORMAL HIGH (ref 70–99)
Potassium: 4.7 mmol/L (ref 3.5–5.1)
Sodium: 130 mmol/L — ABNORMAL LOW (ref 135–145)
Total Bilirubin: 17.1 mg/dL — ABNORMAL HIGH (ref 0.0–1.2)
Total Protein: 6.6 g/dL (ref 6.5–8.1)

## 2024-01-05 LAB — CBC
HCT: 27.1 % — ABNORMAL LOW (ref 39.0–52.0)
Hemoglobin: 9.2 g/dL — ABNORMAL LOW (ref 13.0–17.0)
MCH: 30.7 pg (ref 26.0–34.0)
MCHC: 33.9 g/dL (ref 30.0–36.0)
MCV: 90.3 fL (ref 80.0–100.0)
Platelets: 293 10*3/uL (ref 150–400)
RBC: 3 MIL/uL — ABNORMAL LOW (ref 4.22–5.81)
RDW: 17.8 % — ABNORMAL HIGH (ref 11.5–15.5)
WBC: 20.7 10*3/uL — ABNORMAL HIGH (ref 4.0–10.5)
nRBC: 0 % (ref 0.0–0.2)

## 2024-01-05 LAB — APTT
aPTT: 93 s — ABNORMAL HIGH (ref 24–36)
aPTT: 91 s — ABNORMAL HIGH (ref 24–36)
aPTT: 101 s — ABNORMAL HIGH (ref 24–36)

## 2024-01-05 LAB — MAGNESIUM: Magnesium: 2.4 mg/dL (ref 1.7–2.4)

## 2024-01-05 MED ORDER — NEPRO/CARBSTEADY PO LIQD
237.0000 mL | Freq: Two times a day (BID) | ORAL | Status: DC
  Administered 2024-01-06 – 2024-01-23 (×13): 237 mL via ORAL

## 2024-01-05 MED ORDER — ARGATROBAN 50 MG/50ML IV SOLN
0.3500 ug/kg/min | INTRAVENOUS | Status: DC
  Administered 2024-01-05: 0.45 ug/kg/min via INTRAVENOUS
  Administered 2024-01-05: 0.5 ug/kg/min via INTRAVENOUS
  Administered 2024-01-06: 0.35 ug/kg/min via INTRAVENOUS
  Filled 2024-01-05 (×3): qty 50

## 2024-01-05 MED ORDER — ACETAMINOPHEN 500 MG PO TABS
1000.0000 mg | ORAL_TABLET | Freq: Three times a day (TID) | ORAL | Status: DC | PRN
  Administered 2024-01-05 – 2024-01-09 (×2): 1000 mg via ORAL
  Filled 2024-01-05 (×2): qty 2

## 2024-01-05 NOTE — Progress Notes (Signed)
 PHARMACY - ANTICOAGULATION CONSULT NOTE  Pharmacy Consult for argatroban  Indication: atrial fibrillation , hx of HIT in 2022   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, SRA positive   Metoprolol  Other (See Comments)    near syncope   Other Swelling and Other (See Comments)    Steroids Fluid seeping out of legs Nebulizer(unknown name of neb) elevated pressure    Tramadol      unknown    Patient Measurements: Height: 6' (182.9 cm) Weight: (!) 172.1 kg (379 lb 6.6 oz) IBW/kg (Calculated) : 77.6 HEPARIN  DW (KG): 125.9  Vital Signs: Temp: 97.9 F (36.6 C) (06/12 1112) Temp Source: Oral (06/12 1112) BP: 95/69 (06/12 1345) Pulse Rate: 117 (06/12 1345)  Labs: Recent Labs    01/03/24 1449 01/03/24 1830 01/04/24 0250 01/04/24 0251 01/04/24 1455 01/05/24 0347 01/05/24 0348 01/05/24 1418  HGB 9.7*  --  9.4*  --   --  9.2*  --   --   HCT 29.2*  --  28.4*  --   --  27.1*  --   --   PLT 309  --  295  --   --  293  --   --   APTT  --   --   --   --   --   --   --  93*  LABPROT 30.9*  --   --   --   --   --   --   --   INR 2.9*  --   --   --   --   --   --   --   CREATININE 6.92*  --  5.45*   < > 3.98* 3.00* 3.05*  --   TROPONINIHS  --  84*  --   --   --   --   --   --    < > = values in this interval not displayed.    Estimated Creatinine Clearance: 53.6 mL/min (A) (by C-G formula based on SCr of 3.05 mg/dL (H)).   Medical History: Past Medical History:  Diagnosis Date   Acute on chronic respiratory failure with hypoxia ([name]) 04/21/2021   Acute on chronic systolic (congestive) heart failure ([name]) 02/26/2020   Amiodarone  toxicity    Anemia    Atrial flutter ([name])    Biventricular congestive heart failure ([name])    Candida parapsilosis  infection 12/26/2023   Chronic hypoxemic respiratory failure ([name])    Class 3 severe obesity due to excess calories with serious comorbidity and body mass index (BMI) of 50.0 to 59.9 in adult 02/26/2020   ESRD on hemodialysis ([name])    Essential hypertension 02/26/2020   GERD without esophagitis 02/26/2020   Hidradenitis suppurativa 02/26/2020   NICM (nonischemic cardiomyopathy) ([name])    Obesity hypoventilation syndrome ([name])    OSA (obstructive sleep apnea)    PAF (paroxysmal atrial fibrillation) ([name])    Pneumonia    Prediabetes 02/26/2020   Assessment: Patient admitted with hypotension suspecting secondary to low EF. Hx of Afib on Eliquis  PTA, last dose 6/10 AM. Currently in Afib on CRRT - limited evidence for the use of DOACs while on CRRT. With hx of HIT - will utilize argatroban for now for anticoagulation. HgB 9.2 and PLTs  293.  aPTT slightly supra-therapeutic at 93 while on argatroban 0.5mcg/kg/min. No issues noted.   Goal of Therapy:  aPTT 50-90 seconds Monitor platelets by anticoagulation protocol: Yes   Plan:  Reduce argatroban by 10% to 0.45 mcg/kg/min.  Check aPTT in 4 hours.  Monitor H/H and for s/sx of bleeding.  Once off CRRT plan to transition back to Eliquis .   Mamie Searles, PharmD, BCCCP  01/05/2024,3:08 PM

## 2024-01-05 NOTE — Progress Notes (Signed)
 PHARMACY - ANTICOAGULATION CONSULT NOTE  Pharmacy Consult for argatroban  Indication: atrial fibrillation , hx of HIT in 2022   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, SRA positive   Metoprolol  Other (See Comments)    near syncope   Other Swelling and Other (See Comments)    Steroids Fluid seeping out of legs Nebulizer(unknown name of neb) elevated pressure    Tramadol      unknown    Patient Measurements: Height: 6' (182.9 cm) Weight: (!) 172.1 kg (379 lb 6.6 oz) IBW/kg (Calculated) : 77.6 HEPARIN  DW (KG): 125.9  Vital Signs: Temp: 97.4 F (36.3 C) (06/12 0723) Temp Source: Oral (06/12 0723) BP: 96/57 (06/12 0800) Pulse Rate: 110 (06/12 0800)  Labs: Recent Labs    01/03/24 1449 01/03/24 1830 01/04/24 0250 01/04/24 0251 01/04/24 1455 01/05/24 0347 01/05/24 0348  HGB 9.7*  --  9.4*  --   --  9.2*  --   HCT 29.2*  --  28.4*  --   --  27.1*  --   PLT 309  --  295  --   --  293  --   LABPROT 30.9*  --   --   --   --   --   --   INR 2.9*  --   --   --   --   --   --   CREATININE 6.92*  --  5.45*   < > 3.98* 3.00* 3.05*  TROPONINIHS  --  84*  --   --   --   --   --    < > = values in this interval not displayed.    Estimated Creatinine Clearance: 53.6 mL/min (A) (by C-G formula based on SCr of 3.05 mg/dL (H)).   Medical History: Past Medical History:  Diagnosis Date   Acute on chronic respiratory failure with hypoxia ([name]) 04/21/2021   Acute on chronic systolic (congestive) heart failure ([name]) 02/26/2020   Amiodarone  toxicity    Anemia    Atrial flutter ([name])    Biventricular congestive heart failure ([name])    Candida parapsilosis infection 12/26/2023   Chronic hypoxemic respiratory failure ([name])    Class 3 severe obesity  due to excess calories with serious comorbidity and body mass index (BMI) of 50.0 to 59.9 in adult 02/26/2020   ESRD on hemodialysis ([name])    Essential hypertension 02/26/2020   GERD without esophagitis 02/26/2020   Hidradenitis suppurativa 02/26/2020   NICM (nonischemic cardiomyopathy) ([name])    Obesity hypoventilation syndrome ([name])    OSA (obstructive sleep apnea)    PAF (paroxysmal atrial fibrillation) ([name])    Pneumonia    Prediabetes 02/26/2020   Assessment: Patient admitted with hypotension suspecting secondary to low EF. Hx of Afib on Eliquis  PTA, last dose 6/10 AM. Currently in Afib on CRRT - limited evidence for the use of DOACs while on CRRT. With hx of HIT - will utilize argatroban for now for anticoagulation. HgB 9.2 and PLTs 293.  Goal of Therapy:  aPTT 50-90 seconds Monitor platelets by anticoagulation protocol: Yes   Plan:  Start argatroban 0.5 mcg/kg/min.  Check aPTT in 4 hours.  Monitor H/H and for s/sx of bleeding.  Once off CRRT plan to transition back to Eliquis .  Mamie Searles, PharmD, BCCCP  01/05/2024,8:09 AM

## 2024-01-05 NOTE — Progress Notes (Signed)
 I have asked the patient twice if he ready to wear his Bipap and he has declined twice. I notified the nurse. I will place on Bipap when patient decides that he is ready to wear it.

## 2024-01-05 NOTE — Progress Notes (Signed)
 NAME:  Phillip Young, MRN:  [id number], DOB:  [birth year], LOS: 2 ADMISSION DATE:  01/03/2024, CONSULTATION DATE:  01/03/24 REFERRING MD:  Dr. Val Garin, CHIEF COMPLAINT:  SOB/ chills   History of Present Illness:   42 yoM w/PMH as below significant for biventricular HF (EF < 20%), chronic afib on Eliquis , chronic hypotension on midodrine  30mg  TID, ESRD, chronic hypoxic and hypercarbic respiratory failure on NIV, morbid obesity, medical non-compliance, and chronic pain presenting from home after being discharged from [facility] 6/9 with SOB, chills, subjective fever, ongoing poor PO, vomiting today strings of blood but then reported it was with coughing episodes, pain all over, and increased weeping from left thigh blister.  Had BM, unclear on consistency/ color.  Reports last dialysis yesterday.  Initially reports not taking any PO meds today but then said he did this morning.  Non- ambulatory at baseline.  Frequent admits, most recently 5/13-5/22 for recurrent septic shock and MRSE bacteremia due to suspected line complicated by acute on chronic HF and respiratory failure, suspected IE but not a candidate for TEE, discharged on vancomycin  with HD scheduled thru 6/30.  TDC was replaced 5/22 after 2 day line holiday.  Also started on micafungin w/HD at [facility] after [facility] grew candida species, scheduled thru 6/30.   In ER, temp 99.1, on HFNC at 6L, with SBP into 70's requiring NE support after 500 ml bolus.  Labs significant for Na 129, K 6.3, Cl 89, alk phos 346, AST 84, t.bili 12 (recent 3.4 on 5/14), lactic 3.8, WBC 20.6 (previously 8.1 on 5/22), INR 2.9, CXR showing vascular congestion and increasing congestion and pleural effusions.  BC sent and empirically started on linezolid , flagyl , and cefepime .  Pending CT chest/abd/ pelvis.  Nephrology consulted.  PCCM called for admit.   Pertinent  Medical History   Past Medical History:  Diagnosis Date   Acute on chronic respiratory failure with hypoxia ([name])  04/21/2021   Acute on chronic systolic (congestive) heart failure ([name]) 02/26/2020   Amiodarone  toxicity    Anemia    Atrial flutter ([name])    Biventricular congestive heart failure ([name])    Candida parapsilosis infection 12/26/2023   Chronic hypoxemic respiratory failure ([name])    Class 3 severe obesity due to excess calories with serious comorbidity and body mass index (BMI) of 50.0 to 59.9 in adult 02/26/2020   ESRD on hemodialysis [facility])    Essential hypertension 02/26/2020   GERD without esophagitis 02/26/2020   Hidradenitis suppurativa 02/26/2020   NICM (nonischemic cardiomyopathy) ([name])    Obesity hypoventilation syndrome ([name])    OSA (obstructive sleep apnea)    PAF (paroxysmal atrial fibrillation) ([name])    Pneumonia    Prediabetes 02/26/2020   Significant Hospital Events: Including procedures, antibiotic start and stop dates in addition to other pertinent events   6/10 Admit 6/12 Remains on vasopressors  Interim History / Subjective:   Low dose vasopressor stable with fluid removal.  Objective    Blood pressure 98/75, pulse (!) 120, temperature (!) 97.4 F (36.3 C), temperature source Oral, resp. rate 17, height 6' (1.829 m), weight (!) 172.1 kg, SpO2 95%.        Intake/Output Summary (Last 24 hours) at 01/05/2024 1002 Last data filed at 01/05/2024 0900 Gross per 24 hour  Intake 2299.86 ml  Output 5118 ml  Net -2818.14 ml   Filed Weights   01/03/24 1518 01/04/24 0328 01/05/24 0500  Weight: (!) 180 kg (!) 176.3 kg (!) 172.1 kg   Examination: General:  Chronically ill-appearing obese male lying in bed, appears uncomfortable. HEENT: Voice is hoarse. Neuro: Awake and appropriate with no focal deficits. CV: Heart sounds are unremarkable.  Remains in atrial fibrillation. PULM: Chest is clear to auscultation bilaterally. GI: Abdomen is soft. Extremities: Chronic lower extremity brawny edema. ++ dependent edema. Skin: Skin wounds are clean.   Ancillary test  personally reviewed  Hyponatremia 131.  WBC 20 Echo shows  Assessment and Plan   Shock which appears to be predominantly secondary to decompensated HFrEF No clear source of infection at this time. Cholelistasis without dilatation.  Likely secondary to acute illness. Rate controlled permanent atrial fibrillation.  Acute on chronic hypoxic and hypercarbic respiratory failure due to obesity hypoventilation. ESRD on Monday Wednesday Friday hemodialysis. Anemia of renal disease Chronic pain syndrome Hypothyroidism   Plan:  - Remains on low dose vasopressors. Now tolerating fluid removal.  - Spoke to Dr Mitzie Anda - patient is well know to AHF service. Limited therapeutic options other than vasopressor supported fluid removal. Hope is that with decompression BP will improve to the point where he is able to tolerate intermittent HD.  - Baseline hypotension on home midodrine , precludes GDHFT - Continue current pain control regimen.   Best Practice (right click and Reselect all SmartList Selections daily)   Diet/type: NPO w/ oral meds DVT prophylaxis SCD Pressure ulcer(s): present on admission  GI prophylaxis: PPI Lines: Dialysis Catheter-pta R TDC (12/15/23) Foley:  N/A Code Status:  full code Last date of multidisciplinary goals of care discussion [6/10]  CRITICAL CARE Performed by: Arlina Lair   Total critical care time: 45 minutes  Critical care time was exclusive of separately billable procedures and treating other patients.  Critical care was necessary to treat or prevent imminent or life-threatening deterioration.  Critical care was time spent personally by me on the following activities: development of treatment plan with patient and/or surrogate as well as nursing, discussions with consultants, evaluation of patient's response to treatment, examination of patient, obtaining history from patient or surrogate, ordering and performing treatments and interventions, ordering  and review of laboratory studies, ordering and review of radiographic studies, pulse oximetry, re-evaluation of patient's condition and participation in multidisciplinary rounds.  Arlina Lair, MD [facility] ICU Physician [facility] Critical Care  Pager: [phone] Mobile: [phone] After hours: [phone].

## 2024-01-05 NOTE — Plan of Care (Signed)

## 2024-01-05 NOTE — Progress Notes (Signed)
 [facility] NEPHROLOGY PROGRESS NOTE  Assessment/ Plan: Pt is a 38 y.o. yo male with past medical history of biventricular heart failure EF less than 20, chronic A-fib, ESRD on HD, chronic hypotension on high-dose midodrine , nonadherence with outpatient HD and treatment, recently at [facility] presented with shock and volume overload.  # Shock cardiac versus septic: On antibiotics and UF with HD.  Currently on Levophed  with dose titration.  # ESRD MWF, noncompliant with outpatient HD with chronic hyperkalemia.  Currently on CRRT, UF as tolerated.  Around 100 cc an hour net negative.  # Hyperkalemia: CRRT prescription changed with improvement of potassium level.  # Anemia of ESRD: Dose Aranesp .  Transfuse as needed.  # Secondary hyperparathyroidism/hyperphosphatemia: Currently on CRRT.  Follow labs.  # Chronic hypotension/volume: On Levophed  for shock and receiving midodrine .  Subjective: Seen and examined.  Tolerating CRRT well.  No nausea, vomiting and breathing is much better.  No problem with CRRT filter.  Discussed with ICU nurse.  Objective Vital signs in last 24 hours: Vitals:   01/05/24 0845 01/05/24 0900 01/05/24 0915 01/05/24 0930  BP: 104/60  100/60 98/75  Pulse: (!) 107 (!) 120 (!) 118 (!) 120  Resp: (!) 27 (!) 39 (!) 30 17  Temp:      TempSrc:      SpO2: 94% 96% 95% 95%  Weight:      Height:       Weight change: -7.9 kg  Intake/Output Summary (Last 24 hours) at 01/05/2024 1049 Last data filed at 01/05/2024 1000 Gross per 24 hour  Intake 2568.51 ml  Output 5298 ml  Net -2729.49 ml       Labs: RENAL PANEL Recent Labs  Lab 01/04/24 0250 01/04/24 0251 01/04/24 1455 01/05/24 0347 01/05/24 0348  NA 132* 131* 131* 130* 131*  K 5.8* 5.7* 5.4* 4.7 4.7  CL 94* 95* 96* 98 98  CO2 22 21* 22 23 21*  GLUCOSE 88 89 96 100* 102*  BUN 52* 51* 39* 28* 29*  CREATININE 5.45* 5.37* 3.98* 3.00* 3.05*  CALCIUM  9.1 8.9 9.4 8.8* 8.9  MG 2.3  --   --  2.4   --   PHOS  --  7.0* 5.3*  --  4.3  ALBUMIN  2.0* 2.0* 2.1* 2.1* 2.1*    Liver Function Tests: Recent Labs  Lab 01/03/24 1449 01/04/24 0250 01/04/24 0251 01/04/24 1455 01/05/24 0347 01/05/24 0348  AST 84* 86*  --   --  86*  --   ALT 32 33  --   --  33  --   ALKPHOS 346* 333*  --   --  324*  --   BILITOT 12.0* 13.4*  --   --  17.1*  --   PROT 6.4* 6.6  --   --  6.6  --   ALBUMIN  1.7* 2.0*   < > 2.1* 2.1* 2.1*   < > = values in this interval not displayed.   No results for input(s): LIPASE, AMYLASE in the last 168 hours. No results for input(s): AMMONIA in the last 168 hours. CBC: Recent Labs    02/21/23 1538 02/22/23 0209 04/14/23 0314 04/15/23 1054 05/19/23 2020 05/20/23 1422 08/31/23 0348 09/01/23 0343 12/14/23 0418 12/15/23 0321 01/03/24 1449 01/04/24 0250 01/05/24 0347  HGB  --    < >  --    < >  --    < > 10.3*   < > 11.4* 10.2* 9.7* 9.4* 9.2*  MCV  --    < >  --    < >  --    < >  100.6*   < > 100.3* 101.8* 89.6 88.5 90.3  VITAMINB12  --   --  1,014*  --   --   --  815  --   --   --   --   --   --   FOLATE 12.0  --   --   --   --   --  7.9  --   --   --   --   --   --   FERRITIN  --   --   --   --  359*  --  241  --   --   --   --   --   --   TIBC  --   --   --   --  211*  --  364  --   --   --   --   --   --   IRON  --   --   --   --  64  --  58  --   --   --   --   --   --    < > = values in this interval not displayed.    Cardiac Enzymes: No results for input(s): CKTOTAL, CKMB, CKMBINDEX, TROPONINI in the last 168 hours. CBG: Recent Labs  Lab 01/03/24 1740  GLUCAP 128*    Iron Studies: No results for input(s): IRON, TIBC, TRANSFERRIN, FERRITIN in the last 72 hours. Studies/Results: US  Abdomen Limited RUQ (LIVER/GB) Result Date: 01/03/2024 CLINICAL DATA:  Jaundice EXAM: ULTRASOUND ABDOMEN LIMITED RIGHT UPPER QUADRANT COMPARISON:  CT today FINDINGS: Gallbladder: Gallbladder is contracted. No visible stones. Negative sonographic  Murphy sign. Common bile duct: Diameter: Normal caliber, 2 mm. Liver: No focal lesion identified. Within normal limits in parenchymal echogenicity. Portal vein is patent on color Doppler imaging with normal direction of blood flow towards the liver. Other: None. IMPRESSION: No acute findings. Contracted gallbladder. Electronically Signed   By: Janeece Mechanic M.D.   On: 01/03/2024 19:39   CT CHEST ABDOMEN PELVIS W CONTRAST Result Date: 01/03/2024 CLINICAL DATA:  Sepsis. EXAM: CT CHEST, ABDOMEN, AND PELVIS WITH CONTRAST TECHNIQUE: Multidetector CT imaging of the chest, abdomen and pelvis was performed following the standard protocol during bolus administration of intravenous contrast. RADIATION DOSE REDUCTION: This exam was performed according to the departmental dose-optimization program which includes automated exposure control, adjustment of the mA and/or kV according to patient size and/or use of iterative reconstruction technique. CONTRAST:  OMNIPAQUE  IOHEXOL  350 MG/ML SOLN COMPARISON:  Chest radiograph dated 01/03/2024 and CT pelvis dated 11/01/2023. FINDINGS: Evaluation of this exam is limited due to respiratory motion as well as streak artifact caused by patient's arms. CT CHEST FINDINGS Cardiovascular: Mild chronic pericardial effusion adjacent to the right atrium measures 2 cm in thickness. Central venous catheter with tip in the right atrium. The thoracic aorta is unremarkable. The origins of the great vessels of the aortic arch and the central pulmonary arteries are patent. Mediastinum/Nodes: No obvious hilar or mediastinal adenopathy. The esophagus is grossly unremarkable. No mediastinal fluid collection. Lungs/Pleura: Interstitial and interlobular septal prominence with diffuse ground-glass density throughout the lungs most consistent with edema. Pneumonia is not excluded. No pleural effusion or pneumothorax. The central airways are patent. Musculoskeletal: No acute osseous pathology. CT ABDOMEN  PELVIS FINDINGS No intra-abdominal free air or free fluid. Hepatobiliary: The liver is unremarkable. No biliary dilatation. The gallbladder is from the visualized and appears contracted. No calcified gallstone. Pancreas: Unremarkable.  No pancreatic ductal dilatation or surrounding inflammatory changes. Spleen: Normal in size without focal abnormality. Adrenals/Urinary Tract: The adrenal glands are unremarkable. There is no hydronephrosis on either side. The urinary bladder is collapsed. Stomach/Bowel: There is no bowel obstruction. The appendix is normal. Vascular/Lymphatic: Mild aortoiliac atherosclerotic disease. The IVC is unremarkable. No portal venous gas. There is no adenopathy. Reproductive: The prostate and seminal vesicles are grossly unremarkable. No pelvic mass. Other: There is diffuse subcutaneous edema.  No fluid collection. Musculoskeletal: Irregular appearance of the subcutaneous fat superficial to the sacrum similar to prior CT may be sequela of prior inflammatory/infectious process or represent areas of fat necrosis. No fluid collection. No acute osseous pathology. IMPRESSION: 1. Pulmonary edema. Pneumonia is not excluded. 2. No bowel obstruction. Normal appendix. 3. No fluid collection. 4.  Aortic Atherosclerosis (ICD10-I70.0). Electronically Signed   By: Angus Bark M.D.   On: 01/03/2024 16:44   DG Chest 2 View Result Date: 01/03/2024 CLINICAL DATA:  Sepsis. EXAM: CHEST - 2 VIEW COMPARISON:  Chest x-ray 12/11/2023 and older FINDINGS: Markedly limited examination. The lateral view is nondiagnostic. Frontal view is under penetrated. Enlarged cardiopericardial silhouette as on prior with vascular congestion. Increasing pleural effusions. No pneumothorax. Right IJ double-lumen catheter with tip overlying the right atrium. Overlapping cardiac leads. IMPRESSION: Extremely limited examination as above. The lateral view is nondiagnostic. Enlarged cardiopericardial silhouette with increasing  vascular congestion and pleural effusions. Right IJ double-lumen catheter with tip lower along the mediastinum, possibly right atrial. Please correlate with history. Electronically Signed   By: Adrianna Horde M.D.   On: 01/03/2024 15:17    Medications: Infusions:  anticoagulant sodium citrate      argatroban 0.5 mcg/kg/min (01/05/24 1000)   norepinephrine  (LEVOPHED ) Adult infusion 8 mcg/min (01/05/24 1000)   prismasol  BGK 0/2.5 400 mL/hr at 01/04/24 1902   PrismaSol  BGK 2/3.5 600 mL/hr at 01/05/24 0258   prismasol  BGK 4/2.5 2,000 mL/hr at 01/05/24 0920   vancomycin       Scheduled Medications:  Chlorhexidine  Gluconate Cloth  6 each Topical Daily   leptospermum manuka honey  1 Application Topical Daily   levothyroxine   25 mcg Oral Q0600   liver oil-zinc  oxide   Topical BID   midodrine   30 mg Oral Q8H   multivitamin  1 tablet Oral QHS   mouth rinse  15 mL Mouth Rinse 4 times per day   pregabalin   50 mg Oral Daily   venlafaxine  XR  37.5 mg Oral Daily   zinc  sulfate (50mg  elemental zinc )  220 mg Oral Daily    have reviewed scheduled and prn medications.  Physical Exam: General:NAD, comfortable Heart:RRR, s1s2 nl Lungs: Bilateral distant breath sound Abdomen:soft, distended Extremities: Peripheral edema Dialysis Access: TDC  Phillip Young Phillip Young 01/05/2024,10:49 AM  LOS: 2 days

## 2024-01-06 DIAGNOSIS — I5021 Acute systolic (congestive) heart failure: Secondary | ICD-10-CM | POA: Diagnosis not present

## 2024-01-06 DIAGNOSIS — I4821 Permanent atrial fibrillation: Secondary | ICD-10-CM | POA: Diagnosis not present

## 2024-01-06 DIAGNOSIS — D72829 Elevated white blood cell count, unspecified: Secondary | ICD-10-CM | POA: Diagnosis not present

## 2024-01-06 DIAGNOSIS — R579 Shock, unspecified: Secondary | ICD-10-CM | POA: Diagnosis not present

## 2024-01-06 LAB — RENAL FUNCTION PANEL
Albumin: 1.9 g/dL — ABNORMAL LOW (ref 3.5–5.0)
Albumin: 1.8 g/dL — ABNORMAL LOW (ref 3.5–5.0)
Anion gap: 10 (ref 5–15)
Anion gap: 13 (ref 5–15)
BUN: 19 mg/dL (ref 6–20)
BUN: 18 mg/dL (ref 6–20)
CO2: 23 mmol/L (ref 22–32)
CO2: 22 mmol/L (ref 22–32)
Calcium: 8.8 mg/dL — ABNORMAL LOW (ref 8.9–10.3)
Calcium: 8.9 mg/dL (ref 8.9–10.3)
Chloride: 98 mmol/L (ref 98–111)
Chloride: 97 mmol/L — ABNORMAL LOW (ref 98–111)
Creatinine, Ser: 2.06 mg/dL — ABNORMAL HIGH (ref 0.61–1.24)
Creatinine, Ser: 1.95 mg/dL — ABNORMAL HIGH (ref 0.61–1.24)
GFR, Estimated: 42 mL/min — ABNORMAL LOW (ref >60)
GFR, Estimated: 44 mL/min — ABNORMAL LOW (ref >60)
Glucose, Bld: 86 mg/dL (ref 70–99)
Glucose, Bld: 120 mg/dL — ABNORMAL HIGH (ref 70–99)
Phosphorus: 3.4 mg/dL (ref 2.5–4.6)
Phosphorus: 2.9 mg/dL (ref 2.5–4.6)
Potassium: 4.1 mmol/L (ref 3.5–5.1)
Potassium: 4.1 mmol/L (ref 3.5–5.1)
Sodium: 131 mmol/L — ABNORMAL LOW (ref 135–145)
Sodium: 132 mmol/L — ABNORMAL LOW (ref 135–145)

## 2024-01-06 LAB — COMPREHENSIVE METABOLIC PANEL WITH GFR
ALT: 36 U/L (ref 0–44)
AST: 83 U/L — ABNORMAL HIGH (ref 15–41)
Albumin: 1.9 g/dL — ABNORMAL LOW (ref 3.5–5.0)
Alkaline Phosphatase: 340 U/L — ABNORMAL HIGH (ref 38–126)
Anion gap: 10 (ref 5–15)
BUN: 21 mg/dL — ABNORMAL HIGH (ref 6–20)
CO2: 23 mmol/L (ref 22–32)
Calcium: 8.9 mg/dL (ref 8.9–10.3)
Chloride: 99 mmol/L (ref 98–111)
Creatinine, Ser: 2.19 mg/dL — ABNORMAL HIGH (ref 0.61–1.24)
GFR, Estimated: 39 mL/min — ABNORMAL LOW (ref >60)
Glucose, Bld: 88 mg/dL (ref 70–99)
Potassium: 4.2 mmol/L (ref 3.5–5.1)
Sodium: 132 mmol/L — ABNORMAL LOW (ref 135–145)
Total Bilirubin: 18.6 mg/dL (ref 0.0–1.2)
Total Protein: 6.4 g/dL — ABNORMAL LOW (ref 6.5–8.1)

## 2024-01-06 LAB — CBC
HCT: 28.7 % — ABNORMAL LOW (ref 39.0–52.0)
Hemoglobin: 9.3 g/dL — ABNORMAL LOW (ref 13.0–17.0)
MCH: 29.5 pg (ref 26.0–34.0)
MCHC: 32.4 g/dL (ref 30.0–36.0)
MCV: 91.1 fL (ref 80.0–100.0)
Platelets: 280 10*3/uL (ref 150–400)
RBC: 3.15 MIL/uL — ABNORMAL LOW (ref 4.22–5.81)
RDW: 18.1 % — ABNORMAL HIGH (ref 11.5–15.5)
WBC: 19.2 10*3/uL — ABNORMAL HIGH (ref 4.0–10.5)
nRBC: 0 % (ref 0.0–0.2)

## 2024-01-06 LAB — MAGNESIUM: Magnesium: 2.4 mg/dL (ref 1.7–2.4)

## 2024-01-06 LAB — APTT
aPTT: 100 s — ABNORMAL HIGH (ref 24–36)
aPTT: 107 s — ABNORMAL HIGH (ref 24–36)
aPTT: 113 s — ABNORMAL HIGH (ref 24–36)
aPTT: 123 s — ABNORMAL HIGH (ref 24–36)

## 2024-01-06 LAB — GLUCOSE, CAPILLARY: Glucose-Capillary: 88 mg/dL (ref 70–99)

## 2024-01-06 MED ORDER — PRISMASOL BGK 2/3.5 32-2-3.5 MEQ/L EC SOLN: Status: DC

## 2024-01-06 MED ORDER — POLYETHYLENE GLYCOL 3350 17 G PO PACK
17.0000 g | PACK | Freq: Every day | ORAL | Status: DC
  Administered 2024-01-06 – 2024-01-16 (×2): 17 g via ORAL
  Filled 2024-01-06 (×5): qty 1

## 2024-01-06 NOTE — Progress Notes (Addendum)
 PHARMACY - ANTICOAGULATION CONSULT NOTE  Pharmacy Consult for argatroban   Indication: atrial fibrillation , hx of HIT in 2022   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, SRA positive   Metoprolol  Other (See Comments)    near syncope   Other Swelling and Other (See Comments)    Steroids Fluid seeping out of legs Nebulizer(unknown name of neb) elevated pressure    Tramadol      unknown    Patient Measurements: Height: 6' (182.9 cm) Weight: (!) 171.3 kg (377 lb 10.4 oz) IBW/kg (Calculated) : 77.6 HEPARIN  DW (KG): 125.9  Vital Signs: Temp: 97.8 F (36.6 C) (06/13 0400) Temp Source: Oral (06/13 0400) BP: 116/59 (06/13 0815) Pulse Rate: 118 (06/13 0815)  Labs: Recent Labs    01/03/24 1449 01/03/24 1830 01/04/24 0250 01/04/24 0251 01/05/24 0347 01/05/24 0348 01/05/24 1530 01/05/24 2015 01/05/24 2333 01/06/24 0442 01/06/24 0443  HGB 9.7*  --  9.4*  --  9.2*  --   --   --   --  9.3*  --   HCT 29.2*  --  28.4*  --  27.1*  --   --   --   --  28.7*  --   PLT 309  --  295  --  293  --   --   --   --  280  --   APTT  --   --   --   --   --    < > 91* 101* 107*  --   --   LABPROT 30.9*  --   --   --   --   --   --   --   --   --   --   INR 2.9*  --   --   --   --   --   --   --   --   --   --   CREATININE 6.92*  --  5.45*   < > 3.00*   < > 2.46*  --   --  2.19* 2.06*  TROPONINIHS  --  84*  --   --   --   --   --   --   --   --   --    < > = values in this interval not displayed.    Estimated Creatinine Clearance: 79.2 mL/min (A) (by C-G formula based on SCr of 2.06 mg/dL (H)).   Medical History: Past Medical History:  Diagnosis Date   Acute on chronic respiratory failure with hypoxia ([name]) 04/21/2021   Acute on chronic systolic  (congestive) heart failure ([name]) 02/26/2020   Amiodarone  toxicity    Anemia    Atrial flutter ([name])    Biventricular congestive heart failure ([name])    Candida parapsilosis infection 12/26/2023   Chronic hypoxemic respiratory failure ([name])    Class 3 severe obesity due to excess calories with serious comorbidity and body mass index (BMI) of 50.0 to 59.9 in adult 02/26/2020   ESRD on hemodialysis ([name])    Essential hypertension 02/26/2020   GERD without esophagitis 02/26/2020   Hidradenitis suppurativa 02/26/2020   NICM (nonischemic cardiomyopathy) ([name])    Obesity hypoventilation syndrome ([name])    OSA (obstructive sleep apnea)  PAF (paroxysmal atrial fibrillation) ([name])    Pneumonia    Prediabetes 02/26/2020   Assessment: Patient admitted with hypotension suspecting secondary to low EF. Hx of Afib on Eliquis  PTA, last dose 6/10 AM. Currently in Afib on CRRT - limited evidence for the use of DOACs while on CRRT. With hx of HIT - will utilize argatroban  for now for anticoagulation. HgB 9.2 and PLTs 293.  aPTT slightly supra-therapeutic at 123 while on argatroban  0.35mcg/kg/min. No issues noted. T-bili continues to increase no clear cause, CCM suspecting sepsis indcued. No recent baseline aPTT, however aPTT continues to increase even after dose reduction x2.   Goal of Therapy:  aPTT 50-90 seconds Monitor platelets by anticoagulation protocol: Yes   Plan:  Hold argatroban  x 2 hours, get repeat aPTT to determine baseline aPTT to evaluate if impact on DTI monitoring.  Monitor H/H and for s/sx of bleeding.   Mamie Searles, PharmD, BCCCP  Clinical Pharmacist Phone: [phone]

## 2024-01-06 NOTE — Progress Notes (Signed)
 NAME:  Phillip Young, MRN:  [id number], DOB:  1985-11-10, LOS: 3 ADMISSION DATE:  01/03/2024, CONSULTATION DATE:  01/03/24 REFERRING MD:  Dr. Val Garin, CHIEF COMPLAINT:  SOB/ chills   History of Present Illness:   21 yoM w/PMH as below significant for biventricular HF (EF < 20%), chronic afib on Eliquis , chronic hypotension on midodrine  30mg  TID, ESRD, chronic hypoxic and hypercarbic respiratory failure on NIV, morbid obesity, medical non-compliance, and chronic pain presenting from home after being discharged from [facility] 6/9 with SOB, chills, subjective fever, ongoing poor PO, vomiting today strings of blood but then reported it was with coughing episodes, pain all over, and increased weeping from left thigh blister.  Had BM, unclear on consistency/ color.  Reports last dialysis yesterday.  Initially reports not taking any PO meds today but then said he did this morning.  Non- ambulatory at baseline.  Frequent admits, most recently 5/13-5/22 for recurrent septic shock and MRSE bacteremia due to suspected line complicated by acute on chronic HF and respiratory failure, suspected IE but not a candidate for TEE, discharged on vancomycin  with HD scheduled thru 6/30.  TDC was replaced 5/22 after 2 day line holiday.  Also started on micafungin  w/HD at [facility] after [facility] grew candida species, scheduled thru 6/30.   In ER, temp 99.1, on HFNC at 6L, with SBP into 70's requiring NE support after 500 ml bolus.  Labs significant for Na 129, K 6.3, Cl 89, alk phos 346, AST 84, t.bili 12 (recent 3.4 on 5/14), lactic 3.8, WBC 20.6 (previously 8.1 on 5/22), INR 2.9, CXR showing vascular congestion and increasing congestion and pleural effusions.  BC sent and empirically started on linezolid , flagyl , and cefepime .  Pending CT chest/abd/ pelvis.  Nephrology consulted.  PCCM called for admit.   Pertinent  Medical History   Past Medical History:  Diagnosis Date   Acute on chronic respiratory failure with hypoxia ([name])  04/21/2021   Acute on chronic systolic (congestive) heart failure ([name]) 02/26/2020   Amiodarone  toxicity    Anemia    Atrial flutter ([name])    Biventricular congestive heart failure ([name])    Candida parapsilosis infection 12/26/2023   Chronic hypoxemic respiratory failure ([name])    Class 3 severe obesity due to excess calories with serious comorbidity and body mass index (BMI) of 50.0 to 59.9 in adult 02/26/2020   ESRD on hemodialysis ([name])    Essential hypertension 02/26/2020   GERD without esophagitis 02/26/2020   Hidradenitis suppurativa 02/26/2020   NICM (nonischemic cardiomyopathy) ([name])    Obesity hypoventilation syndrome ([name])    OSA (obstructive sleep apnea)    PAF (paroxysmal atrial fibrillation) ([name])    Pneumonia    Prediabetes 02/26/2020   Significant Hospital Events: Including procedures, antibiotic start and stop dates in addition to other pertinent events   6/10 Admit 6/12 Remains on vasopressors.  Spoke to heart failure.  Not a candidate for advanced therapies.  Not a candidate for tailored hemodynamics.  Recommend a strategy of vasopressor support at fluid removal in an attempt to transition back to IHD.  Interim History / Subjective:     Objective    Blood pressure 94/63, pulse 98, temperature 97.8 F (36.6 C), temperature source Oral, resp. rate (!) 25, height 6' (1.829 m), weight (!) 171.3 kg, SpO2 96%.        Intake/Output Summary (Last 24 hours) at 01/06/2024 0755 Last data filed at 01/06/2024 0700 Gross per 24 hour  Intake 2344.67 ml  Output 4903 ml  Net -2558.33 ml   Filed Weights   01/04/24 0328 01/05/24 0500 01/06/24 0407  Weight: (!) 176.3 kg (!) 172.1 kg (!) 171.3 kg   Examination: General: Chronically ill-appearing obese male lying in bed, appears uncomfortable. HEENT: Voice is hoarse. Neuro: Awake and appropriate with no focal deficits. CV: Heart sounds are unremarkable.  Remains in atrial fibrillation. PULM: Chest is clear to auscultation  bilaterally. GI: Abdomen is soft. Extremities: Chronic lower extremity brawny edema. ++ dependent edema. Skin: Skin wounds are clean.   Ancillary test personally reviewed  Hyponatremia 131.  WBC 19 Echo shows severe biventricular failure.  Assessment and Plan   Shock which appears to be predominantly secondary to decompensated HFrEF No clear source of infection at this time.  Leukocytosis may be related to general hypoperfusion. Cholelistasis without dilatation.  Likely secondary to acute illness. Rate controlled permanent atrial fibrillation.  Acute on chronic hypoxic and hypercarbic respiratory failure due to obesity hypoventilation. ESRD on Monday Wednesday Friday hemodialysis. Anemia of renal disease Chronic pain syndrome Hypothyroidism   Plan:  - Continue argatroban  for anticoagulation as patient has history of HIT.  Presently well rate controlled.  Avoid amiodarone  due to prior allergy. - Wean norepinephrine  to keep MAP greater than 65.  If not able to come off norepinephrine  over the weekend we will need to consider a transition to comfort care next week as all therapeutic options will have been exhausted at that point per discussion with heart failure team. - Continue to emphasize strict use of BiPAP at bedtime.  - Patient has been refusing dressing changes but of emphasize that we need to look at his wounds to decide on further antibiotic therapy.   Best Practice (right click and Reselect all SmartList Selections daily)   Diet/type: NPO w/ oral meds DVT prophylaxis SCD Pressure ulcer(s): present on admission  GI prophylaxis: PPI Lines: Dialysis Catheter-pta R TDC (12/15/23) Foley:  N/A Code Status:  full code Last date of multidisciplinary goals of care discussion [6/10]  CRITICAL CARE Performed by: Arlina Lair   Total critical care time: 35 minutes  Critical care time was exclusive of separately billable procedures and treating other patients.  Critical  care was necessary to treat or prevent imminent or life-threatening deterioration.  Critical care was time spent personally by me on the following activities: development of treatment plan with patient and/or surrogate as well as nursing, discussions with consultants, evaluation of patient's response to treatment, examination of patient, obtaining history from patient or surrogate, ordering and performing treatments and interventions, ordering and review of laboratory studies, ordering and review of radiographic studies, pulse oximetry, re-evaluation of patient's condition and participation in multidisciplinary rounds.  Arlina Lair, MD [facility] ICU Physician [facility] [place] [facility]  Pager: [phone] Mobile: [phone] After hours: [phone].

## 2024-01-06 NOTE — Progress Notes (Signed)
 [facility] NEPHROLOGY PROGRESS NOTE  Assessment/ Plan: Pt is a 38 y.o. yo male with past medical history of biventricular heart failure EF less than 20, chronic A-fib, ESRD on HD, chronic hypotension on high-dose midodrine , nonadherence with outpatient HD and treatment, recently at [facility] presented with shock and volume overload.  # Shock cardiac +/- septic: On antibiotics and UF with HD.  Currently on Levophed  with dose titration. Bld cx NGTD 3d  # ESRD MWF, noncompliant with outpatient HD with chronic hyperkalemia.  Currently on CRRT, UF as tolerated.  Around 100 cc an hour net negative.  # Hyperkalemia: resolved with CRRT. Change all fluids to 2K today, ok to let current bags run out.   # Anemia of ESRD: Dose Aranesp .  Transfuse as needed.  # Secondary hyperparathyroidism/hyperphosphatemia: Currently on CRRT.  Follow labs.  # Chronic hypotension/volume: On Levophed  for shock and receiving midodrine  to facilitate UF.  # a fib: rate controlled  #HIT: avoiding heparin   Subjective: Seen and examined.  Tolerating CRRT well.  No nausea, vomiting and breathing is much better.  No problem with CRRT filter.  Discussed with ICU nurse.   Objective Vital signs in last 24 hours: Vitals:   01/06/24 0730 01/06/24 0745 01/06/24 0800 01/06/24 0815  BP: (!) 99/56 99/63 92/61  (!) 116/59  Pulse: (!) 107 (!) 103 (!) 109 (!) 118  Resp: (!) 26 (!) 22 (!) 21 (!) 29  Temp:      TempSrc:      SpO2: 96% 94% 97% 95%  Weight:      Height:       Weight change: -0.8 kg  Intake/Output Summary (Last 24 hours) at 01/06/2024 0932 Last data filed at 01/06/2024 0900 Gross per 24 hour  Intake 2467.43 ml  Output 4952 ml  Net -2484.57 ml       Labs: RENAL PANEL Recent Labs  Lab 01/04/24 0250 01/04/24 0251 01/04/24 1455 01/05/24 0347 01/05/24 0348 01/05/24 1530 01/06/24 0442 01/06/24 0443  NA 132* 131* 131* 130* 131* 131* 132* 131*  K 5.8* 5.7* 5.4* 4.7 4.7 4.5 4.2 4.1  CL  94* 95* 96* 98 98 95* 99 98  CO2 22 21* 22 23 21* 22 23 23   GLUCOSE 88 89 96 100* 102* 91 88 86  BUN 52* 51* 39* 28* 29* 24* 21* 19  CREATININE 5.45* 5.37* 3.98* 3.00* 3.05* 2.46* 2.19* 2.06*  CALCIUM  9.1 8.9 9.4 8.8* 8.9 8.7* 8.9 8.8*  MG 2.3  --   --  2.4  --   --  2.4  --   PHOS  --  7.0* 5.3*  --  4.3 3.7  --  3.4  ALBUMIN  2.0* 2.0* 2.1* 2.1* 2.1* 2.0* 1.9* 1.9*    Liver Function Tests: Recent Labs  Lab 01/04/24 0250 01/04/24 0251 01/05/24 0347 01/05/24 0348 01/05/24 1530 01/06/24 0442 01/06/24 0443  AST 86*  --  86*  --   --  83*  --   ALT 33  --  33  --   --  36  --   ALKPHOS 333*  --  324*  --   --  340*  --   BILITOT 13.4*  --  17.1*  --   --  18.6*  --   PROT 6.6  --  6.6  --   --  6.4*  --   ALBUMIN  2.0*   < > 2.1*   < > 2.0* 1.9* 1.9*   < > = values in this interval  not displayed.   No results for input(s): LIPASE, AMYLASE in the last 168 hours. No results for input(s): AMMONIA in the last 168 hours. CBC: Recent Labs    02/21/23 1538 02/22/23 0209 04/14/23 0314 04/15/23 1054 05/19/23 2020 05/20/23 1422 08/31/23 0348 09/01/23 0343 12/15/23 0321 01/03/24 1449 01/04/24 0250 01/05/24 0347 01/06/24 0442  HGB  --    < >  --    < >  --    < > 10.3*   < > 10.2* 9.7* 9.4* 9.2* 9.3*  MCV  --    < >  --    < >  --    < > 100.6*   < > 101.8* 89.6 88.5 90.3 91.1  VITAMINB12  --   --  1,014*  --   --   --  815  --   --   --   --   --   --   FOLATE 12.0  --   --   --   --   --  7.9  --   --   --   --   --   --   FERRITIN  --   --   --   --  359*  --  241  --   --   --   --   --   --   TIBC  --   --   --   --  211*  --  364  --   --   --   --   --   --   IRON  --   --   --   --  64  --  58  --   --   --   --   --   --    < > = values in this interval not displayed.    Cardiac Enzymes: No results for input(s): CKTOTAL, CKMB, CKMBINDEX, TROPONINI in the last 168 hours. CBG: Recent Labs  Lab 01/03/24 1740  GLUCAP 128*    Iron Studies: No results  for input(s): IRON, TIBC, TRANSFERRIN, FERRITIN in the last 72 hours. Studies/Results: No results found.   Medications: Infusions:  anticoagulant sodium citrate      argatroban  0.35 mcg/kg/min (01/06/24 0900)   norepinephrine  (LEVOPHED ) Adult infusion 7 mcg/min (01/06/24 0900)   prismasol  BGK 0/2.5 400 mL/hr at 01/06/24 0908   PrismaSol  BGK 2/3.5 600 mL/hr at 01/06/24 0407   prismasol  BGK 4/2.5 2,000 mL/hr at 01/06/24 0735   vancomycin  Stopped (01/05/24 1315)    Scheduled Medications:  Chlorhexidine  Gluconate Cloth  6 each Topical Daily   feeding supplement (NEPRO CARB STEADY)  237 mL Oral BID BM   leptospermum manuka honey  1 Application Topical Daily   levothyroxine   25 mcg Oral Q0600   liver oil-zinc  oxide   Topical BID   midodrine   30 mg Oral Q8H   multivitamin  1 tablet Oral QHS   mouth rinse  15 mL Mouth Rinse 4 times per day   polyethylene glycol  17 g Oral Daily   pregabalin   50 mg Oral Daily   venlafaxine  XR  37.5 mg Oral Daily   zinc  sulfate (50mg  elemental zinc )  220 mg Oral Daily    have reviewed scheduled and prn medications.  Physical Exam: General:NAD, lying flat Heart:RRR, s1s2 nl Lungs: Bilateral distant breath sound Abdomen:soft, mildly distended Extremities: 1-2+ Peripheral edema Dialysis Access: [facility]  Heidi Llamas A Azahel Belcastro 01/06/2024,9:32 AM  LOS: 3 days

## 2024-01-06 NOTE — Progress Notes (Signed)
 Pt continues to refuse wound care,turns, bed mobility and BM meds. CCM called to bedside to reassess.

## 2024-01-06 NOTE — Progress Notes (Signed)
 PHARMACY - ANTICOAGULATION CONSULT NOTE  Pharmacy Consult for argatroban   Indication: atrial fibrillation , hx of HIT in 2022   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, SRA positive   Metoprolol  Other (See Comments)    near syncope   Other Swelling and Other (See Comments)    Steroids Fluid seeping out of legs Nebulizer(unknown name of neb) elevated pressure    Tramadol      unknown    Patient Measurements: Height: 6' (182.9 cm) Weight: (!) 171.3 kg (377 lb 10.4 oz) IBW/kg (Calculated) : 77.6 HEPARIN  DW (KG): 125.9  Vital Signs: Temp: 98.6 F (37 C) (06/13 1900) Temp Source: Axillary (06/13 1900) BP: 103/53 (06/13 1815) Pulse Rate: 106 (06/13 1815)  Labs: Recent Labs    01/04/24 0250 01/04/24 0251 01/05/24 0347 01/05/24 0348 01/06/24 0442 01/06/24 0443 01/06/24 0841 01/06/24 1400 01/06/24 1550 01/06/24 1654  HGB 9.4*  --  9.2*  --  9.3*  --   --   --   --   --   HCT 28.4*  --  27.1*  --  28.7*  --   --   --   --   --   PLT 295  --  293  --  280  --   --   --   --   --   APTT  --   --   --    < >  --   --  123* 113*  --  100*  CREATININE 5.45*   < > 3.00*   < > 2.19* 2.06*  --   --  1.95*  --    < > = values in this interval not displayed.    Estimated Creatinine Clearance: 83.6 mL/min (A) (by C-G formula based on SCr of 1.95 mg/dL (H)).   Medical History: Past Medical History:  Diagnosis Date   Acute on chronic respiratory failure with hypoxia ([name]) 04/21/2021   Acute on chronic systolic (congestive) heart failure ([name]) 02/26/2020   Amiodarone  toxicity    Anemia    Atrial flutter ([name])    Biventricular congestive heart failure ([name])    Candida parapsilosis infection 12/26/2023   Chronic hypoxemic respiratory failure  ([name])    Class 3 severe obesity due to excess calories with serious comorbidity and body mass index (BMI) of 50.0 to 59.9 in adult 02/26/2020   ESRD on hemodialysis ([name])    Essential hypertension 02/26/2020   GERD without esophagitis 02/26/2020   Hidradenitis suppurativa 02/26/2020   NICM (nonischemic cardiomyopathy) ([name])    Obesity hypoventilation syndrome ([name])    OSA (obstructive sleep apnea)    PAF (paroxysmal atrial fibrillation) ([name])    Pneumonia    Prediabetes 02/26/2020   Assessment: Patient admitted with hypotension suspecting secondary to low EF. Hx of Afib on Eliquis  PTA, last dose 6/10 AM. Currently in Afib on CRRT - limited evidence for the use of DOACs while on CRRT. With hx of HIT - will utilize argatroban  for now for anticoagulation. HgB 9.2 and PLTs 293.  aPTT slightly supra-therapeutic at 123 while on argatroban  0.35mcg/kg/min. No issues noted. T-bili continues to increase to 18 no clear cause, CCM suspecting sepsis indcued. No recent baseline aPTT.  Argatroban  held for 2hr aptt 113sec held another  2hours and aptt 100 sec  Question underlying coagulopathy or slow to clear argatroban   Goal of Therapy:  aPTT 50-90 seconds Monitor platelets by anticoagulation protocol: Yes   Plan:  Hold argatroban  until am labs and reassess Monitor H/H and for s/sx of bleeding.    Hortensia Ma Pharm.D. CPP, BCPS Clinical Pharmacist [phone] 01/06/2024 9:13 PM

## 2024-01-06 NOTE — Progress Notes (Signed)
 Pt refused wound care, bath and CHG

## 2024-01-06 NOTE — Progress Notes (Signed)
 PHARMACY - ANTICOAGULATION CONSULT NOTE  Pharmacy Consult for argatroban   Indication: atrial fibrillation , hx of HIT in 2022   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, SRA positive   Metoprolol  Other (See Comments)    near syncope   Other Swelling and Other (See Comments)    Steroids Fluid seeping out of legs Nebulizer(unknown name of neb) elevated pressure    Tramadol      unknown    Patient Measurements: Height: 6' (182.9 cm) Weight: (!) 172.1 kg (379 lb 6.6 oz) IBW/kg (Calculated) : 77.6 HEPARIN  DW (KG): 125.9  Vital Signs: Temp: 97.7 F (36.5 C) (06/13 0003) Temp Source: Oral (06/13 0003) BP: 97/56 (06/13 0115) Pulse Rate: 103 (06/13 0115)  Labs: Recent Labs    01/03/24 1449 01/03/24 1830 01/04/24 0250 01/04/24 0251 01/05/24 0347 01/05/24 0348 01/05/24 1418 01/05/24 1530 01/05/24 2015 01/05/24 2333  HGB 9.7*  --  9.4*  --  9.2*  --   --   --   --   --   HCT 29.2*  --  28.4*  --  27.1*  --   --   --   --   --   PLT 309  --  295  --  293  --   --   --   --   --   APTT  --   --   --   --   --   --    < > 91* 101* 107*  LABPROT 30.9*  --   --   --   --   --   --   --   --   --   INR 2.9*  --   --   --   --   --   --   --   --   --   CREATININE 6.92*  --  5.45*   < > 3.00* 3.05*  --  2.46*  --   --   TROPONINIHS  --  84*  --   --   --   --   --   --   --   --    < > = values in this interval not displayed.    Estimated Creatinine Clearance: 66.5 mL/min (A) (by C-G formula based on SCr of 2.46 mg/dL (H)).   Medical History: Past Medical History:  Diagnosis Date   Acute on chronic respiratory failure with hypoxia ([name]) 04/21/2021   Acute on chronic systolic (congestive) heart failure ([name]) 02/26/2020   Amiodarone  toxicity     Anemia    Atrial flutter ([name])    Biventricular congestive heart failure ([name])    Candida parapsilosis infection 12/26/2023   Chronic hypoxemic respiratory failure ([name])    Class 3 severe obesity due to excess calories with serious comorbidity and body mass index (BMI) of 50.0 to 59.9 in adult 02/26/2020   ESRD on hemodialysis ([name])    Essential hypertension 02/26/2020   GERD without esophagitis 02/26/2020   Hidradenitis suppurativa 02/26/2020   NICM (nonischemic cardiomyopathy) ([name])    Obesity hypoventilation syndrome ([name])    OSA (obstructive sleep apnea)    PAF (paroxysmal atrial fibrillation) ([name])    Pneumonia    Prediabetes 02/26/2020   Assessment: Patient admitted  with hypotension suspecting secondary to low EF. Hx of Afib on Eliquis  PTA, last dose 6/10 AM. Currently in Afib on CRRT - limited evidence for the use of DOACs while on CRRT. With hx of HIT - will utilize argatroban  for now for anticoagulation. HgB 9.2 and PLTs 293.  aPTT slightly supra-therapeutic at 93 while on argatroban  0.5mcg/kg/min. No issues noted.   6/13 AM update:  aPTT supra-therapeutic   Goal of Therapy:  aPTT 50-90 seconds Monitor platelets by anticoagulation protocol: Yes   Plan:  Reduce argatroban  to 0.35 mcg/kg/min.  Check aPTT in 4 hours.  Monitor H/H and for s/sx of bleeding.   Silvestre Drum, PharmD, BCPS Clinical Pharmacist Phone: [phone]

## 2024-01-06 NOTE — Progress Notes (Incomplete)
 PHARMACY - ANTICOAGULATION CONSULT NOTE  Pharmacy Consult for argatroban   Indication: atrial fibrillation , hx of HIT in 2022   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, SRA positive   Metoprolol  Other (See Comments)    near syncope   Other Swelling and Other (See Comments)    Steroids Fluid seeping out of legs Nebulizer(unknown name of neb) elevated pressure    Tramadol      unknown    Patient Measurements: Height: 6' (182.9 cm) Weight: (!) 171.3 kg (377 lb 10.4 oz) IBW/kg (Calculated) : 77.6 HEPARIN  DW (KG): 125.9  Vital Signs: BP: 93/69 (06/13 1445) Pulse Rate: 125 (06/13 1445)  Labs: Recent Labs     0000 01/03/24 1830 01/04/24 0250 01/04/24 0251 01/05/24 0347 01/05/24 0348 01/05/24 1530 01/05/24 2015 01/05/24 2333 01/06/24 0442 01/06/24 0443 01/06/24 0841 01/06/24 1400  HGB   < >  --  9.4*  --  9.2*  --   --   --   --  9.3*  --   --   --   HCT  --   --  28.4*  --  27.1*  --   --   --   --  28.7*  --   --   --   PLT  --   --  295  --  293  --   --   --   --  280  --   --   --   APTT  --   --   --   --   --    < > 91*   < > 107*  --   --  123* 113*  CREATININE  --   --  5.45*   < > 3.00*   < > 2.46*  --   --  2.19* 2.06*  --   --   TROPONINIHS  --  84*  --   --   --   --   --   --   --   --   --   --   --    < > = values in this interval not displayed.    Estimated Creatinine Clearance: 79.2 mL/min (A) (by C-G formula based on SCr of 2.06 mg/dL (H)).   Medical History: Past Medical History:  Diagnosis Date   Acute on chronic respiratory failure with hypoxia ([name]) 04/21/2021   Acute on chronic systolic (congestive) heart failure ([name]) 02/26/2020   Amiodarone  toxicity    Anemia    Atrial flutter ([name])    Biventricular  congestive heart failure ([name])    Candida parapsilosis infection 12/26/2023   Chronic hypoxemic respiratory failure ([name])    Class 3 severe obesity due to excess calories with serious comorbidity and body mass index (BMI) of 50.0 to 59.9 in adult 02/26/2020   ESRD on hemodialysis ([name])    Essential hypertension 02/26/2020   GERD without esophagitis 02/26/2020   Hidradenitis suppurativa 02/26/2020   NICM (nonischemic cardiomyopathy) ([name])    Obesity hypoventilation syndrome ([name])    OSA (obstructive sleep apnea)    PAF (paroxysmal atrial fibrillation) ([name])    Pneumonia    Prediabetes 02/26/2020   Assessment: Patient admitted with hypotension suspecting secondary to low EF. Hx of Afib on  Eliquis  PTA, last dose 6/10 AM. Currently in Afib on CRRT - limited evidence for the use of DOACs while on CRRT. With hx of HIT - will utilize argatroban  for now for anticoagulation. HgB 9.2 and PLTs 293.  aPTT slightly supra-therapeutic at 123 while on argatroban  0.35mcg/kg/min. No issues noted. T-bili continues to increase no clear cause, CCM suspecting sepsis indcued. No recent baseline aPTT, however aPTT continues to increase even after dose reduction x2.   6/13 PM - aPTT 113 sec (down from 123 sec) remains above goal despite argatroban  being held for the past 4h.  No overt bleeding per RN.  Running through L PIV, lab drawn from R arm.  Goal of Therapy:  aPTT 50-90 seconds Monitor platelets by anticoagulation protocol: Yes   Plan:  Hold argatroban  x 2 hours, get repeat aPTT to determine baseline aPTT to evaluate if impact on DTI monitoring.  Monitor H/H and for s/sx of bleeding.   Cecillia Cogan, PharmD Clinical Pharmacist 01/06/2024  4:27 PM

## 2024-01-06 NOTE — Progress Notes (Addendum)
 Family at bedside and providing pt w bath. This RN made multiple attempts prior to this w/ pt refusal

## 2024-01-06 NOTE — Therapy (Deleted)
 OUTPATIENT SPEECH LANGUAGE PATHOLOGY VOICE EVALUATION   Patient Name: Phillip Young MRN: [id number] DOB:[birth year], 38 y.o., male Today's Date: 01/06/2024  PCP: *** REFERRING PROVIDER: ***  END OF SESSION:   Past Medical History:  Diagnosis Date   Acute on chronic respiratory failure with hypoxia ([name]) 04/21/2021   Acute on chronic systolic (congestive) heart failure ([name]) 02/26/2020   Amiodarone  toxicity    Anemia    Atrial flutter ([name])    Biventricular congestive heart failure ([name])    Candida parapsilosis infection 12/26/2023   Chronic hypoxemic respiratory failure ([name])    Class 3 severe obesity due to excess calories with serious comorbidity and body mass index (BMI) of 50.0 to 59.9 in adult 02/26/2020   ESRD on hemodialysis ([name])    Essential hypertension 02/26/2020   GERD without esophagitis 02/26/2020   Hidradenitis suppurativa 02/26/2020   NICM (nonischemic cardiomyopathy) ([name])    Obesity hypoventilation syndrome ([name])    OSA (obstructive sleep apnea)    PAF (paroxysmal atrial fibrillation) ([name])    Pneumonia    Prediabetes 02/26/2020   Past Surgical History:  Procedure Laterality Date   ABSCESS DRAINAGE     AV FISTULA PLACEMENT Left 08/21/2021   Procedure: LEFT ARM ARTERIOVENOUS (AV) FISTULA.;  Surgeon: Margherita Shell, MD;  Location: [facility] OR;  Service: Vascular;  Laterality: Left;   CARDIAC CATHETERIZATION     CARDIOVERSION N/A 10/09/2021   Procedure: CARDIOVERSION;  Surgeon: Darlis Eisenmenger, MD;  Location: [facility] ENDOSCOPY;  Service: Cardiovascular;  Laterality: N/A;   CARDIOVERSION N/A 05/28/2022   Procedure: CARDIOVERSION;  Surgeon: Darlis Eisenmenger, MD;  Location: [facility] ENDOSCOPY;  Service: Cardiovascular;  Laterality: N/A;   CARDIOVERSION N/A 06/07/2022   Procedure: CARDIOVERSION;  Surgeon: Darlis Eisenmenger, MD;  Location: [facility] ENDOSCOPY;  Service: Cardiovascular;  Laterality: N/A;   IR FLUORO GUIDE CV LINE RIGHT  03/10/2021   IR FLUORO GUIDE CV LINE RIGHT   04/22/2021   IR FLUORO GUIDE CV LINE RIGHT  08/20/2021   IR FLUORO GUIDE CV LINE RIGHT  03/01/2023   IR FLUORO GUIDE CV LINE RIGHT  03/09/2023   IR FLUORO GUIDE CV LINE RIGHT  05/30/2023   IR FLUORO GUIDE CV LINE RIGHT  11/10/2023   IR FLUORO GUIDE CV LINE RIGHT  12/15/2023   IR PTA VENOUS EXCEPT DIALYSIS CIRCUIT  03/09/2023   IR REMOVAL TUN CV CATH W/O FL  05/26/2023   IR REMOVAL TUN CV CATH W/O FL  11/07/2023   IR REMOVAL TUN CV CATH W/O FL  12/13/2023   IR REMOVE CV FIBRIN SHEATH  03/09/2023   IR US  GUIDE VASC ACCESS RIGHT  03/10/2021   IR US  GUIDE VASC ACCESS RIGHT  04/22/2021   IR US  GUIDE VASC ACCESS RIGHT  05/30/2023   IR US  GUIDE VASC ACCESS RIGHT  11/10/2023   IR US  GUIDE VASC ACCESS RIGHT  12/15/2023   RIGHT HEART CATH N/A 03/06/2021   Procedure: RIGHT HEART CATH;  Surgeon: Mardell Shade, MD;  Location: [facility] INVASIVE CV LAB;  Service: Cardiovascular;  Laterality: N/A;   RIGHT/LEFT HEART CATH AND CORONARY ANGIOGRAPHY N/A 03/04/2020   Procedure: RIGHT/LEFT HEART CATH AND CORONARY ANGIOGRAPHY;  Surgeon: Darlis Eisenmenger, MD;  Location: [facility] INVASIVE CV LAB;  Service: Cardiovascular;  Laterality: N/A;   TEE WITHOUT CARDIOVERSION N/A 05/05/2021   Procedure: TRANSESOPHAGEAL ECHOCARDIOGRAM (TEE);  Surgeon: Darlis Eisenmenger, MD;  Location: [facility] ENDOSCOPY;  Service: Cardiovascular;  Laterality: N/A;   TEMPORARY DIALYSIS CATHETER  03/06/2021   Procedure:  TEMPORARY DIALYSIS CATHETER;  Surgeon: Mardell Shade, MD;  Location: [facility] INVASIVE CV LAB;  Service: Cardiovascular;;   Patient Active Problem List   Diagnosis Date Noted   Candida parapsilosis infection 12/26/2023   Staphylococcus epidermidis sepsis ([name]) 12/10/2023   Anasarca associated with disorder of kidney 12/10/2023   Bacteremia associated with intravascular line ([name]) 12/07/2023   Shock ([name]) 12/06/2023   At risk for central line-associated bloodstream infection (CLABSI) 11/03/2023   Enterococcal bacteremia 11/03/2023   Bacteremia  due to methicillin resistant Staphylococcus epidermidis 11/03/2023   Pressure injury of skin 11/03/2023   Sacral wound 10/12/2023   Chronic, continuous use of opioids 10/06/2023   Acute encephalopathy 09/22/2023   ESRD (end stage renal disease) ([name]) 09/10/2023   Paronychia of great toe of right foot 09/09/2023   Ingrown nail of great toe of right foot 09/09/2023   Ingrown toenail of right foot 09/08/2023   Atrial fibrillation with RVR ([name]) 09/07/2023   Encounter for screening involving social determinants of health (SDoH) 08/30/2023   Weakness of right foot 08/25/2023   ESRD needing dialysis ([name]) 07/02/2023   Hoarseness of voice 06/21/2023   Adjustment disorder with mixed anxiety and depressed mood 06/10/2023   Metabolic encephalopathy 06/07/2023   Severe muscle deconditioning 06/03/2023   HFrEF (heart failure with reduced ejection fraction) ([name]) 06/01/2023   Neuropathy 05/28/2023   MSSA bacteremia 05/26/2023   Hyperkalemia 03/21/2023   Central line infection 03/10/2023   Pressure injury 01/23/2023   Acute hypoxic respiratory failure ([name]) 01/21/2023   Atrial fibrillation, chronic ([name]) 01/21/2023   Acute on chronic hypoxic respiratory failure ([name]) 08/07/2022   Congestive heart failure ([name]) 08/05/2022   Elevated liver enzymes 06/08/2022   Multiple nodules of lung 05/31/2022   History of Gram positive sepsis    Advanced care planning/counseling discussion    Recurrent infections 09/30/2021   Reactive depression 09/02/2021   Hypervolemia 08/18/2021   Type 2 diabetes mellitus with diabetic chronic kidney disease ([name]) 06/26/2021   ESRD on dialysis [facility])    Medical non-compliance 04/21/2021   Hypotension 04/21/2021   ESRD (end stage renal disease) on dialysis ([name]) 04/16/2021   Paroxysmal atrial flutter ([name]) 04/12/2021   Chronic hyponatremia 04/12/2021   SOB (shortness of breath)    Cardiogenic shock ([name])    Chronic respiratory failure with hypoxia ([name]) 02/11/2021    Chronic heart failure with reduced ejection fraction (HFrEF, <= 40%) ([name])    Chronic cough    Morbid obesity ([name]) 02/26/2020   GERD without esophagitis 02/26/2020   OSA (obstructive sleep apnea) 02/26/2020   Hidradenitis suppurativa 02/26/2020    Onset date: ***  REFERRING DIAG: ***  THERAPY DIAG:  No diagnosis found.  Rationale for Evaluation and Treatment: {HABREHAB:27488}  SUBJECTIVE:   SUBJECTIVE STATEMENT: *** Pt accompanied by: {accompnied:27141}  PERTINENT HISTORY: ***  PAIN:  Are you having pain? {OPRCPAIN:27236}  FALLS: Has patient fallen in last 6 months? {yes/no:20286}, Number of falls: ***  LIVING ENVIRONMENT: Lives with: {OPRC lives with:25569::lives with their family} Lives in: {Lives in:25570}  PLOF:Level of assistance: {BJYNWGN:56213} Employment: {SLPemployment:25674}  PATIENT GOALS: ***  OBJECTIVE:  Note: Objective measures were completed at Evaluation unless otherwise noted.  DIAGNOSTIC FINDINGS: ***  COGNITION: Overall cognitive status: {cognition:24006} Areas of impairment:  {cognitiveimpairmentslp:27409} Functional deficits: ***  SOCIAL HISTORY: Occupation: *** Water  intake: {waterintake:27189} Caffeine/alcohol  intake: {intakelevel:27191} Daily voice use: {intakelevel:27191}  On a scale of 1-5 where 1= very little and 5= excessive, how would you characterize your daily average voice use? ***  On a scale of 0-5, where 0= never and 5= always, how often do you do the following?  Shout or scream *** Talk loudly *** Talk a lot *** Talk over noise *** Use the phone *** Sing *** Surgical history: ***  PHYSICAL SYMPTOMS: Do you have any burning, soreness, tickling, or irritation in your throat? {yes/no:20286}  If yes, can you describe the sensation? *** Do you sometimes have a sensation of a lump in your throat?  {yes/no:20286} Do you have any aching or tightness in your throat? {yes/no:20286} Do you ever feel tension in your neck  area? {yes/no:20286} Does your voice get tired easily? {yes/no:20286} Do you feel as if you have to strain to produce voice? {yes/no:20286} Do you feel as if you need to cough or clear your throat a lot? {yes/no:20286} Do you ever lose your voice completely? {yes/no:20286} Do you ever have difficulty swallowing? {yes/no:20286} Do you have difficulty projecting your voice? {yes/no:20286}  VOCAL ABUSE:  Episodes observed during evaluation: *** Characteristics observed during evaluation: *** Pt report of frequency: *** Pt report of duration: *** Pt description of *** episodes:  Identified triggers: {COUGH TRIGGERS:29247}  PERCEPTUAL VOICE ASSESSMENT: Voice quality: {VQL:27192} Vocal abuse: {VA:27193} Resonance: {resonance:27194} Respiratory function: {respbreathing:27195}  OBJECTIVE VOICE ASSESSMENT: Maximum phonation time for sustained ah: *** Conversational pitch average: *** Hz Conversational pitch range: *** Hz Conversational loudness average: *** dB Conversational loudness range: *** dB S/z ratio: *** (Suggestive of dysfunction >1.0)  STIMULIBILITY TRIALS FOR IMPROVED VOCAL QUALITY: ***  Pt {does does not:27788} report difficulty with swallowing, which {does does not:27788} warrant further evaluation  PATIENT REPORTED OUTCOME MEASURES (PROM): {SLPPROM:27095}  TODAY'S TREATMENT:                                                                                                                                         DATE: ***   PATIENT EDUCATION: Education details: *** Person educated: {Person educated:25204} Education method: {Education Method:25205} Education comprehension: {Education Comprehension:25206}  HOME EXERCISE PROGRAM: ***  GOALS: Goals reviewed with patient? {yes/no:20286}  SHORT TERM GOALS: Target date: ***  *** Baseline: Goal status: INITIAL  2.  *** Baseline:  Goal status: INITIAL  3.  *** Baseline:  Goal status: INITIAL  4.   *** Baseline:  Goal status: INITIAL  5.  *** Baseline:  Goal status: INITIAL  6.  *** Baseline:  Goal status: INITIAL  LONG TERM GOALS: Target date: ***  *** Baseline:  Goal status: INITIAL  2.  *** Baseline:  Goal status: INITIAL  3.  *** Baseline:  Goal status: INITIAL  4.  *** Baseline:  Goal status: INITIAL  5.  *** Baseline:  Goal status: INITIAL  6.  *** Baseline:  Goal status: INITIAL  ASSESSMENT:  CLINICAL IMPRESSION: Patient is a *** y.o. *** who was seen today for voice evaluation. Evaluation reveals {MILD/MOD/MARKED/SEVERE:28894} dysphonia. [name] voice is c/b ***. Pt reports ***.  Pt would benefit from skilled ST to address aforementioned deficits to {CI outcomes:29167}.    OBJECTIVE IMPAIRMENTS: include {SLPOBJIMP:27107}. These impairments are limiting patient from {SLPLIMIT:27108}. Factors affecting potential to achieve goals and functional outcome are {SLP factors:25450}.. Patient will benefit from skilled SLP services to address above impairments and improve overall function.  REHAB POTENTIAL: {rehabpotential:25112}  PLAN:  SLP FREQUENCY: {rehab frequency:25116}  SLP DURATION: {rehab duration:25117}  PLANNED INTERVENTIONS: {SLP treatment/interventions:25449}    Alston Jerry, CCC-SLP 01/06/2024, 4:59 AM

## 2024-01-07 ENCOUNTER — Encounter (HOSPITAL_COMMUNITY): Payer: Self-pay | Admitting: Internal Medicine

## 2024-01-07 DIAGNOSIS — I5021 Acute systolic (congestive) heart failure: Secondary | ICD-10-CM | POA: Diagnosis not present

## 2024-01-07 DIAGNOSIS — L98499 Non-pressure chronic ulcer of skin of other sites with unspecified severity: Secondary | ICD-10-CM | POA: Insufficient documentation

## 2024-01-07 DIAGNOSIS — E662 Morbid (severe) obesity with alveolar hypoventilation: Secondary | ICD-10-CM | POA: Diagnosis present

## 2024-01-07 DIAGNOSIS — I4821 Permanent atrial fibrillation: Secondary | ICD-10-CM | POA: Diagnosis not present

## 2024-01-07 DIAGNOSIS — G894 Chronic pain syndrome: Secondary | ICD-10-CM | POA: Diagnosis present

## 2024-01-07 DIAGNOSIS — R579 Shock, unspecified: Secondary | ICD-10-CM | POA: Diagnosis not present

## 2024-01-07 DIAGNOSIS — D72829 Elevated white blood cell count, unspecified: Secondary | ICD-10-CM | POA: Diagnosis not present

## 2024-01-07 LAB — CBC
HCT: 29.2 % — ABNORMAL LOW (ref 39.0–52.0)
Hemoglobin: 9.3 g/dL — ABNORMAL LOW (ref 13.0–17.0)
MCH: 28.9 pg (ref 26.0–34.0)
MCHC: 31.8 g/dL (ref 30.0–36.0)
MCV: 90.7 fL (ref 80.0–100.0)
Platelets: 275 10*3/uL (ref 150–400)
RBC: 3.22 MIL/uL — ABNORMAL LOW (ref 4.22–5.81)
RDW: 18.2 % — ABNORMAL HIGH (ref 11.5–15.5)
WBC: 17.7 10*3/uL — ABNORMAL HIGH (ref 4.0–10.5)
nRBC: 0 % (ref 0.0–0.2)

## 2024-01-07 LAB — APTT
aPTT: 96 s — ABNORMAL HIGH (ref 24–36)
aPTT: 83 s — ABNORMAL HIGH (ref 24–36)
aPTT: 82 s — ABNORMAL HIGH (ref 24–36)
aPTT: 81 s — ABNORMAL HIGH (ref 24–36)

## 2024-01-07 LAB — COMPREHENSIVE METABOLIC PANEL WITH GFR
ALT: 36 U/L (ref 0–44)
AST: 89 U/L — ABNORMAL HIGH (ref 15–41)
Albumin: 1.8 g/dL — ABNORMAL LOW (ref 3.5–5.0)
Alkaline Phosphatase: 290 U/L — ABNORMAL HIGH (ref 38–126)
Anion gap: 12 (ref 5–15)
BUN: 19 mg/dL (ref 6–20)
CO2: 22 mmol/L (ref 22–32)
Calcium: 9.1 mg/dL (ref 8.9–10.3)
Chloride: 99 mmol/L (ref 98–111)
Creatinine, Ser: 2.02 mg/dL — ABNORMAL HIGH (ref 0.61–1.24)
GFR, Estimated: 42 mL/min — ABNORMAL LOW (ref >60)
Glucose, Bld: 92 mg/dL (ref 70–99)
Potassium: 3.9 mmol/L (ref 3.5–5.1)
Sodium: 133 mmol/L — ABNORMAL LOW (ref 135–145)
Total Bilirubin: 19.7 mg/dL (ref 0.0–1.2)
Total Protein: 6.5 g/dL (ref 6.5–8.1)

## 2024-01-07 LAB — RENAL FUNCTION PANEL
Albumin: 1.8 g/dL — ABNORMAL LOW (ref 3.5–5.0)
Anion gap: 12 (ref 5–15)
BUN: 19 mg/dL (ref 6–20)
CO2: 24 mmol/L (ref 22–32)
Calcium: 9 mg/dL (ref 8.9–10.3)
Chloride: 97 mmol/L — ABNORMAL LOW (ref 98–111)
Creatinine, Ser: 1.92 mg/dL — ABNORMAL HIGH (ref 0.61–1.24)
GFR, Estimated: 45 mL/min — ABNORMAL LOW (ref >60)
Glucose, Bld: 97 mg/dL (ref 70–99)
Phosphorus: 2.6 mg/dL (ref 2.5–4.6)
Potassium: 4.1 mmol/L (ref 3.5–5.1)
Sodium: 133 mmol/L — ABNORMAL LOW (ref 135–145)

## 2024-01-07 LAB — MAGNESIUM: Magnesium: 2.3 mg/dL (ref 1.7–2.4)

## 2024-01-07 LAB — PHOSPHORUS: Phosphorus: 2.6 mg/dL (ref 2.5–4.6)

## 2024-01-07 MED ORDER — NOREPINEPHRINE 16 MG/250ML-% IV SOLN: 0.0000 ug/min | INTRAVENOUS | Status: DC

## 2024-01-07 MED ORDER — CINACALCET HCL 30 MG PO TABS
30.0000 mg | ORAL_TABLET | ORAL | Status: DC
  Administered 2024-01-09 – 2024-01-16 (×4): 30 mg via ORAL
  Filled 2024-01-07 (×5): qty 1

## 2024-01-07 MED ORDER — SENNA 8.6 MG PO TABS
1.0000 | ORAL_TABLET | Freq: Every day | ORAL | Status: DC
  Administered 2024-01-09 – 2024-01-16 (×2): 8.6 mg via ORAL
  Filled 2024-01-07 (×5): qty 1

## 2024-01-07 MED ORDER — SEVELAMER CARBONATE 800 MG PO TABS
1600.0000 mg | ORAL_TABLET | Freq: Three times a day (TID) | ORAL | Status: DC
  Administered 2024-01-08: 1600 mg via ORAL
  Filled 2024-01-07 (×3): qty 2

## 2024-01-07 MED ORDER — DARBEPOETIN ALFA 150 MCG/0.3ML IJ SOSY
150.0000 ug | PREFILLED_SYRINGE | INTRAMUSCULAR | Status: DC
  Administered 2024-01-07: 150 ug via INTRAVENOUS
  Filled 2024-01-07 (×2): qty 0.3

## 2024-01-07 MED ORDER — NOREPINEPHRINE 4 MG/250ML-% IV SOLN
0.0000 ug/min | INTRAVENOUS | Status: DC
  Administered 2024-01-07 – 2024-01-08 (×2): 6 ug/min via INTRAVENOUS
  Administered 2024-01-09: 8 ug/min via INTRAVENOUS
  Administered 2024-01-09: 3 ug/min via INTRAVENOUS
  Administered 2024-01-09: 9 ug/min via INTRAVENOUS
  Administered 2024-01-09: 10 ug/min via INTRAVENOUS
  Administered 2024-01-10 – 2024-01-12 (×7): 9 ug/min via INTRAVENOUS
  Administered 2024-01-12: 8 ug/min via INTRAVENOUS
  Administered 2024-01-12 (×2): 9 ug/min via INTRAVENOUS
  Administered 2024-01-13: 10 ug/min via INTRAVENOUS
  Administered 2024-01-13 (×2): 12 ug/min via INTRAVENOUS
  Administered 2024-01-14: 15 ug/min via INTRAVENOUS
  Administered 2024-01-14: 17 ug/min via INTRAVENOUS
  Administered 2024-01-14 (×2): 14 ug/min via INTRAVENOUS
  Administered 2024-01-14 (×2): 15 ug/min via INTRAVENOUS
  Administered 2024-01-15 (×2): 16 ug/min via INTRAVENOUS
  Administered 2024-01-15: 15 ug/min via INTRAVENOUS
  Administered 2024-01-15 (×2): 16 ug/min via INTRAVENOUS
  Administered 2024-01-16: 15 ug/min via INTRAVENOUS
  Administered 2024-01-16: 16 ug/min via INTRAVENOUS
  Administered 2024-01-16: 13 ug/min via INTRAVENOUS
  Administered 2024-01-16 (×3): 15 ug/min via INTRAVENOUS
  Administered 2024-01-17: 18 ug/min via INTRAVENOUS
  Administered 2024-01-17 (×2): 20 ug/min via INTRAVENOUS
  Administered 2024-01-17: 16 ug/min via INTRAVENOUS
  Administered 2024-01-17 (×2): 20 ug/min via INTRAVENOUS
  Administered 2024-01-18: 19 ug/min via INTRAVENOUS
  Administered 2024-01-18: 20 ug/min via INTRAVENOUS
  Administered 2024-01-18: 18 ug/min via INTRAVENOUS
  Administered 2024-01-18: 20 ug/min via INTRAVENOUS
  Filled 2024-01-07: qty 500
  Filled 2024-01-07 (×43): qty 250

## 2024-01-07 MED ORDER — DIGOXIN 125 MCG PO TABS
0.1250 mg | ORAL_TABLET | ORAL | Status: DC
  Administered 2024-01-07 – 2024-01-15 (×5): 0.125 mg via ORAL
  Filled 2024-01-07 (×6): qty 1

## 2024-01-07 NOTE — Progress Notes (Addendum)
 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, SRA positive   Metoprolol  Other (See Comments)    near syncope   Other Swelling and Other (See Comments)    Steroids Fluid seeping out of legs Nebulizer(unknown name of neb) elevated pressure    Tramadol      unknown    Patient Measurements: Height: 6' (182.9 cm) Weight: (!) 170.9 kg (376 lb 12.3 oz) IBW/kg (Calculated) : 77.6 HEPARIN  DW (KG): 125.9  Vital Signs: Temp: 98.2 F (36.8 C) (06/14 2303) Temp Source: Oral (06/14 1511) BP: 107/70 (06/14 2303) Pulse Rate: 109 (06/14 2303)  Labs: Recent Labs    01/05/24 0347 01/05/24 0348 01/06/24 0442 01/06/24 0443 01/06/24 1550 01/06/24 1654 01/07/24 0246 01/07/24 0252 01/07/24 1306 01/07/24 1707 01/07/24 1708 01/07/24 2221  HGB 9.2*  --  9.3*  --   --   --  9.3*  --   --   --   --   --   HCT 27.1*  --  28.7*  --   --   --  29.2*  --   --   --   --   --   PLT 293  --  280  --   --   --  275  --   --   --   --   --   APTT  --    < >  --    < >  --    < > 96*  --  83*  --  82* 81*  CREATININE 3.00*   < > 2.19*   < > 1.95*  --   --  2.02*  --  1.92*  --   --    < > = values in this interval not displayed.    Estimated Creatinine Clearance: 84.8 mL/min (A) (by C-G formula based on SCr of 1.92 mg/dL (H)).  Assessment: 38 y.o. male with h/o Afib, Eliquis  on hold, for argatroban  due to h/o HIT  Goal of Therapy:  aPTT 50-90 seconds Monitor platelets by anticoagulation protocol: Yes   Plan:  Follow-up am labs.   Claudine Cullens, PharmD, BCPS  01/07/2024 11:31 PM

## 2024-01-07 NOTE — Progress Notes (Signed)
 Pt moved over to a new bed and this RN did wound care and peri care post pain meds given while pt on lift

## 2024-01-07 NOTE — Progress Notes (Signed)
 Pt continues to bend arm while Bp in process despite multiple explanations

## 2024-01-07 NOTE — Plan of Care (Signed)
   Problem: Education: Goal: Knowledge of General Education information will improve Description: Including pain rating scale, medication(s)/side effects and non-pharmacologic comfort measures Outcome: Progressing   Problem: Health Behavior/Discharge Planning: Goal: Ability to manage health-related needs will improve Outcome: Progressing   Problem: Clinical Measurements: Goal: Ability to maintain clinical measurements within normal limits will improve Outcome: Progressing Goal: Will remain free from infection Outcome: Progressing Goal: Diagnostic test results will improve Outcome: Progressing Goal: Respiratory complications will improve Outcome: Progressing Goal: Cardiovascular complication will be avoided Outcome: Progressing   Problem: Activity: Goal: Risk for activity intolerance will decrease Outcome: Progressing   Problem: Nutrition: Goal: Adequate nutrition will be maintained Outcome: Progressing   Problem: Coping: Goal: Level of anxiety will decrease Outcome: Progressing   Problem: Elimination: Goal: Will not experience complications related to bowel motility Outcome: Progressing Goal: Will not experience complications related to urinary retention Outcome: Progressing   Problem: Pain Managment: Goal: General experience of comfort will improve and/or be controlled Outcome: Progressing   Problem: Safety: Goal: Ability to remain free from injury will improve Outcome: Progressing

## 2024-01-07 NOTE — Progress Notes (Signed)
 [facility] NEPHROLOGY PROGRESS NOTE  Assessment/ Plan: Pt is Phillip 38 y.o. yo male with past medical history of biventricular heart failure EF less than 20, chronic Phillip-fib, ESRD on HD, chronic hypotension on high-dose midodrine , nonadherence with outpatient HD and treatment, recently at [facility] presented with shock and volume overload.  # Shock cardiac +/- septic: On antibiotics and UF with HD.  Currently on Levophed  with dose titration. Bld cx NGTD 3d  # ESRD MWF, noncompliant with outpatient HD with chronic hyperkalemia.  Currently on CRRT, UF as tolerated.  Around 100 cc an hour net negative.   # Anemia of ESRD: Dose Aranesp .  Transfuse as needed.  # Secondary hyperparathyroidism/hyperphosphatemia: Currently on CRRT.  Follow labs.  # Chronic hypotension/volume: On Levophed  and midodrine  for shock and receiving midodrine  to facilitate UF.  # Phillip fib: rate controlled  #HIT: avoiding heparin   GOC:  pt with poor prognosis and refusing basic care, not abiding by fluid restriction.  Would re engage with pall care re: long term GOC.  Despite young age his long term prognosis and options are fairly dismal from what I can see.    Subjective: Seen and examined.  Tolerating CRRT I/Os yest 2.5 / 4.9 net net 7 for admission.  No problem with CRRT filter.  Discussed with ICU nurse. Pt refusing care - wound care, BM clean up.  Pt not abiding by fluid restriction.   Objective Vital signs in last 24 hours: Vitals:   01/07/24 0730 01/07/24 0745 01/07/24 0800 01/07/24 1105  BP: (!) 85/54 (!) 96/57 91/60   Pulse: (!) 109 (!) 117 (!) 126   Resp: (!) 27 (!) 23 18   Temp:    (!) 97.3 F (36.3 C)  TempSrc:    Axillary  SpO2: 94% 94% 97%   Weight:      Height:       Weight change: -0.4 kg  Intake/Output Summary (Last 24 hours) at 01/07/2024 1150 Last data filed at 01/07/2024 0900 Gross per 24 hour  Intake 2242.16 ml  Output 4342 ml  Net -2099.84 ml       Labs: RENAL PANEL Recent  Labs  Lab 01/04/24 0250 01/04/24 0251 01/05/24 0347 01/05/24 0348 01/05/24 1530 01/06/24 0442 01/06/24 0443 01/06/24 1550 01/07/24 0252  NA 132*   < > 130* 131* 131* 132* 131* 132* 133*  K 5.8*   < > 4.7 4.7 4.5 4.2 4.1 4.1 3.9  CL 94*   < > 98 98 95* 99 98 97* 99  CO2 22   < > 23 21* 22 23 23 22 22   GLUCOSE 88   < > 100* 102* 91 88 86 120* 92  BUN 52*   < > 28* 29* 24* 21* 19 18 19   CREATININE 5.45*   < > 3.00* 3.05* 2.46* 2.19* 2.06* 1.95* 2.02*  CALCIUM  9.1   < > 8.8* 8.9 8.7* 8.9 8.8* 8.9 9.1  MG 2.3  --  2.4  --   --  2.4  --   --  2.3  PHOS  --    < >  --  4.3 3.7  --  3.4 2.9 2.6  ALBUMIN  2.0*   < > 2.1* 2.1* 2.0* 1.9* 1.9* 1.8* 1.8*   < > = values in this interval not displayed.    Liver Function Tests: Recent Labs  Lab 01/05/24 0347 01/05/24 0348 01/06/24 0442 01/06/24 0443 01/06/24 1550 01/07/24 0252  AST 86*  --  83*  --   --  89*  ALT 33  --  36  --   --  36  ALKPHOS 324*  --  340*  --   --  290*  BILITOT 17.1*  --  18.6*  --   --  19.7*  PROT 6.6  --  6.4*  --   --  6.5  ALBUMIN  2.1*   < > 1.9* 1.9* 1.8* 1.8*   < > = values in this interval not displayed.   No results for input(s): LIPASE, AMYLASE in the last 168 hours. No results for input(s): AMMONIA in the last 168 hours. CBC: Recent Labs    02/21/23 1538 02/22/23 0209 04/14/23 0314 04/15/23 1054 05/19/23 2020 05/20/23 1422 08/31/23 0348 09/01/23 0343 01/03/24 1449 01/04/24 0250 01/05/24 0347 01/06/24 0442 01/07/24 0246  HGB  --    < >  --    < >  --    < > 10.3*   < > 9.7* 9.4* 9.2* 9.3* 9.3*  MCV  --    < >  --    < >  --    < > 100.6*   < > 89.6 88.5 90.3 91.1 90.7  VITAMINB12  --   --  1,014*  --   --   --  815  --   --   --   --   --   --   FOLATE 12.0  --   --   --   --   --  7.9  --   --   --   --   --   --   FERRITIN  --   --   --   --  359*  --  241  --   --   --   --   --   --   TIBC  --   --   --   --  211*  --  364  --   --   --   --   --   --   IRON  --   --   --    --  64  --  58  --   --   --   --   --   --    < > = values in this interval not displayed.    Cardiac Enzymes: No results for input(s): CKTOTAL, CKMB, CKMBINDEX, TROPONINI in the last 168 hours. CBG: Recent Labs  Lab 01/03/24 1740 01/06/24 2325  GLUCAP 128* 88    Iron Studies: No results for input(s): IRON, TIBC, TRANSFERRIN, FERRITIN in the last 72 hours. Studies/Results: No results found.   Medications: Infusions:  anticoagulant sodium citrate      norepinephrine  (LEVOPHED ) Adult infusion 4 mcg/min (01/07/24 0900)   PrismaSol  BGK 2/3.5 400 mL/hr at 01/07/24 0255   PrismaSol  BGK 2/3.5 400 mL/hr at 01/07/24 0557   prismasol  BGK 4/2.5 2,000 mL/hr at 01/07/24 1119   vancomycin  1,750 mg (01/07/24 0920)    Scheduled Medications:  Chlorhexidine  Gluconate Cloth  6 each Topical Daily   [START ON 01/09/2024] cinacalcet   30 mg Oral Q M,W,F   Darbepoetin Alfa   150 mcg Intravenous Q7 days   digoxin   0.125 mg Oral Q48H   feeding supplement (NEPRO CARB STEADY)  237 mL Oral BID BM   leptospermum manuka honey  1 Application Topical Daily   levothyroxine   25 mcg Oral Q0600   liver oil-zinc  oxide   Topical BID   midodrine   30 mg Oral Q8H  multivitamin  1 tablet Oral QHS   mouth rinse  15 mL Mouth Rinse 4 times per day   polyethylene glycol  17 g Oral Daily   pregabalin   50 mg Oral Daily   senna  1 tablet Oral Daily   sevelamer  carbonate  1,600 mg Oral TID WC   venlafaxine  XR  37.5 mg Oral Daily   zinc  sulfate (50mg  elemental zinc )  220 mg Oral Daily    have reviewed scheduled and prn medications.  Physical Exam: General:NAD, lying flat, sleepy but arousable Heart:RRR, s1s2 nl Lungs: Bilateral distant breath sound Abdomen:soft, mildly distended Extremities: 1-2+ Peripheral edema Dialysis Access: TDC  Phillip Young Phillip Young 01/07/2024,11:50 AM  LOS: 4 days

## 2024-01-07 NOTE — Assessment & Plan Note (Signed)
 Known NICM. Echo shows severe RV and LV failure with EF <20%. Non-compliant.   On exam: HS normal. + dependent edema.  - Per HF service, not a candidate for advance therapies.  - No a candidate for GDMT due to hypotension, cannot take SGLT-2 due to skin infection risk.  - Goal is to decompress heart and hopefully get off pressors.  - If unable to wean off pressors, hospice care will be only reasonable option.

## 2024-01-07 NOTE — Progress Notes (Signed)
 2000: Pt with noticeable skin tears to LLE and abrasions to upper to mid back from minor repositioning. Pt refusal to position to apply Vaseline gauze and foam. Noted beneath pt were linen soiled in yellowish stained discharge and minimal bleeding. Pt refuses to turn to visualize site and further refuses linen changes despite coaching of potential risks of infection and further skin break down if left sitting in bed without proper care. Integumentary assessment unable to be properly completed d/t pt refusal.  2200: 3+ assist with pt (this RN, another RN, and NT) positioning on bed pan without fully turning per pt request. Once removed, pt again advised into letting staff change linens d/t pt stained linens (yellow discharge, blood, and scant stool). Pt verbally stern, tearful, and stating I'm done, take me to Hospice.  Pt refusal of care.

## 2024-01-07 NOTE — Hospital Course (Addendum)
 6/10 Patient discharged from [facility] and brought to [facility] ED d/t cellulitis  He was being treated with vancomycin . CRRT started d/t pressor needs.Hx: ESRD, noncompliance, CHF 6/13 Spoke to heart failure.  Not a candidate for advanced therapies.  Not a candidate for tailored hemodynamics.  Recommend a strategy of vasopressor support at fluid removal in an attempt to transition back to IHD 6/14 weaning pressors.

## 2024-01-07 NOTE — Progress Notes (Signed)
 Pt educated on the purpose of 1600 meds and  the importance of taking theses meds but continues to refuse meds

## 2024-01-07 NOTE — Assessment & Plan Note (Signed)
 Intermittently compliant with home BIPAP.  On examination, morbid obesity with increased neck girth. Chest clear.  - Encourage BiPAP use at night.

## 2024-01-07 NOTE — Assessment & Plan Note (Signed)
 Remains on low dose norepinephrine , slowly weaning. On home midodrine .   On exam, awake and extremities warm.   - Wean NE off, keep MAP >65.

## 2024-01-07 NOTE — Assessment & Plan Note (Addendum)
 Unable to tolerate amiodarone .   On exam: rate controlled atrial fibrillation.   - Resume home digoxin  on HD days.

## 2024-01-07 NOTE — Assessment & Plan Note (Signed)
 Feels well today.   Continue home oxycodone  gabapentin 

## 2024-01-07 NOTE — Assessment & Plan Note (Signed)
 Due to non-ischemic cardiomyopathy.  Echo shows severe bic

## 2024-01-07 NOTE — Evaluation (Addendum)
 Physical Therapy Evaluation Patient Details Name: Phillip Young MRN: [id number] DOB: 02-03-1986 Today's Date: 01/07/2024  History of Present Illness  Pt is 38 year old presented to [facility] on  01/03/24 for SOB, chills, and incr weeping from lt thigh blister. Pt was dc'd from [facility] on same date. Pt with shock apparently predominately from decompensated HF. Pt started on CRRT PMH - ESRD on HD, chronic resp failure, morbid obesity, NICM, OSA, chronic pain, chronic hypotension.  Clinical Impression  Pt admitted with above diagnosis and presents to PT with functional limitations due to deficits listed below (See PT problem list). Pt needs skilled PT to maximize independence and safety. Pt seen at bed level and transferred to [facility] tilt bed with nurses and use of maxisky. Currently pt with severe pain in bil thighs making any mobility painful. Not sure that pt will be able to tolerate much therapy but will try to mobilize as able using the Kreg bed. DC options are not good at this point as unsure that pt will be able to tolerate HD in recliner as needed for SNF or for home. Not sure if he is able to return to LTAC. I am not optimistic that pt's mobility will reach a level allowing him to return home.           If plan is discharge home, recommend the following: Two people to help with walking and/or transfers;Two people to help with bathing/dressing/bathroom   Can travel by private vehicle        Equipment Recommendations None recommended by PT  Recommendations for Other Services       Functional Status Assessment Patient has had a recent decline in their functional status and/or demonstrates limited ability to make significant improvements in function in a reasonable and predictable amount of time     Precautions / Restrictions Precautions Precautions: Fall Recall of Precautions/Restrictions: Impaired Restrictions Weight Bearing Restrictions Per Provider Order: No      Mobility  Bed  Mobility Overal bed mobility: Needs Assistance Bed Mobility: Rolling Rolling: +2 for physical assistance, Total assist (+3 total assist)         General bed mobility comments: Assist for all aspects of bed mobility    Transfers Overall transfer level: Needs assistance Equipment used: Ambulation equipment used               General transfer comment: Used maxisky for bed to bed transfer to get pt from ICU bed to Kreg tilt bed Transfer via Lift Equipment: Maxisky  Ambulation/Gait                  Stairs            Wheelchair Mobility     Tilt Bed    Modified Rankin (Stroke Patients Only)       Balance                                             Pertinent Vitals/Pain Pain Assessment Pain Assessment: Faces Faces Pain Scale: Hurts worst Pain Location: Bil thighs Pain Descriptors / Indicators: Moaning, Guarding, Grimacing Pain Intervention(s): Limited activity within patient's tolerance, Repositioned, Monitored during session, RN gave pain meds during session    Home Living Family/patient expects to be discharged to:: Private residence Living Arrangements: Parent Available Help at Discharge: Family;Available 24 hours/day Type of Home: Apartment Home Access: Level entry  Home Layout: One level Home Equipment: Wheelchair - Forensic psychologist (2 wheels);BSC/3in1;Hospital bed;Shower seat;Other (comment) (hoyer lift)      Prior Function Prior Level of Function : Needs assist       Physical Assist : Mobility (physical);ADLs (physical) Mobility (physical): Transfers;Bed mobility   Mobility Comments: Difficult to understand pt's speech at times. It seems he said he could stand and get to w/c with assist but I am not positive that is what he said       Extremity/Trunk Assessment   Upper Extremity Assessment Upper Extremity Assessment: Defer to OT evaluation    Lower Extremity Assessment Lower Extremity  Assessment: RLE deficits/detail;LLE deficits/detail RLE: Unable to fully assess due to pain LLE: Unable to fully assess due to pain    Cervical / Trunk Assessment Cervical / Trunk Assessment: Other exceptions (body habitus)  Communication   Communication Communication: Impaired Factors Affecting Communication: Reduced clarity of speech (and low volume)    Cognition Arousal: Alert Behavior During Therapy: Anxious   PT - Cognitive impairments: Difficult to assess Difficult to assess due to: Impaired communication                     PT - Cognition Comments: Pt with low volume voice and reduced clarity of speech. Following commands: Impaired Following commands impaired: Only follows one step commands consistently     Cueing Cueing Techniques: Verbal cues, Tactile cues     General Comments General comments (skin integrity, edema, etc.): HR and O2 stable on 5L. BP 80-90's/50-60's.    Exercises     Assessment/Plan    PT Assessment Patient needs continued PT services  PT Problem List Decreased strength;Decreased activity tolerance;Decreased balance;Decreased mobility;Pain;Decreased skin integrity       PT Treatment Interventions DME instruction;Functional mobility training;Therapeutic activities;Therapeutic exercise;Balance training;Patient/family education    PT Goals (Current goals can be found in the Care Plan section)  Acute Rehab PT Goals Patient Stated Goal: decr pain PT Goal Formulation: With patient Time For Goal Achievement: 01/21/24 Potential to Achieve Goals: Poor    Frequency Min 1X/week     Co-evaluation               AM-PAC PT 6 Clicks Mobility  Outcome Measure Help needed turning from your back to your side while in a flat bed without using bedrails?: Total Help needed moving from lying on your back to sitting on the side of a flat bed without using bedrails?: Total Help needed moving to and from a bed to a chair (including a  wheelchair)?: Total Help needed standing up from a chair using your arms (e.g., wheelchair or bedside chair)?: Total Help needed to walk in hospital room?: Total Help needed climbing 3-5 steps with a railing? : Total 6 Click Score: 6    End of Session Equipment Utilized During Treatment: Oxygen  Activity Tolerance: Patient limited by pain Patient left: in bed;with call bell/phone within reach Nurse Communication: Mobility status;Need for lift equipment (Nurses present and assisting with transfer) PT Visit Diagnosis: Other abnormalities of gait and mobility (R26.89);Muscle weakness (generalized) (M62.81);Difficulty in walking, not elsewhere classified (R26.2);Pain Pain - Right/Left:  (bil) Pain - part of body: Leg    Time: 1610-9604 PT Time Calculation (min) (ACUTE ONLY): 39 min   Charges:   PT Evaluation $PT Eval Moderate Complexity: 1 Mod PT Treatments $Therapeutic Activity: 23-37 mins PT General Charges $$ ACUTE PT VISIT: 1 Visit         [facility] PT Acute  Rehabilitation Services Office [phone]   Pura Browns [facility] 01/07/2024, 2:00 PM

## 2024-01-07 NOTE — Assessment & Plan Note (Signed)
 Chronic skin ulceration. Patient refusing dressing changes.  On examination: multiple areas of covered wound but no drainage or discoloration.  Complete course of vancomycin  as prescribed from prior admission.

## 2024-01-07 NOTE — Progress Notes (Signed)
 PHARMACY - ANTICOAGULATION CONSULT NOTE  Pharmacy Consult for argatroban   Indication: atrial fibrillation , hx of HIT in 2022   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, SRA positive   Metoprolol  Other (See Comments)    near syncope   Other Swelling and Other (See Comments)    Steroids Fluid seeping out of legs Nebulizer(unknown name of neb) elevated pressure    Tramadol      unknown    Patient Measurements: Height: 6' (182.9 cm) Weight: (!) 170.9 kg (376 lb 12.3 oz) IBW/kg (Calculated) : 77.6 HEPARIN  DW (KG): 125.9  Vital Signs: Temp: 97.6 F (36.4 C) (06/14 0718) Temp Source: Oral (06/14 0718) BP: 91/60 (06/14 0800) Pulse Rate: 126 (06/14 0800)  Labs: Recent Labs    01/05/24 0347 01/05/24 0348 01/06/24 0442 01/06/24 0443 01/06/24 0841 01/06/24 1400 01/06/24 1550 01/06/24 1654 01/07/24 0246 01/07/24 0252  HGB 9.2*  --  9.3*  --   --   --   --   --  9.3*  --   HCT 27.1*  --  28.7*  --   --   --   --   --  29.2*  --   PLT 293  --  280  --   --   --   --   --  275  --   APTT  --    < >  --   --    < > 113*  --  100* 96*  --   CREATININE 3.00*   < > 2.19* 2.06*  --   --  1.95*  --   --  2.02*   < > = values in this interval not displayed.    Estimated Creatinine Clearance: 80.6 mL/min (A) (by C-G formula based on SCr of 2.02 mg/dL (H)).   Medical History: Past Medical History:  Diagnosis Date   Acute on chronic respiratory failure with hypoxia ([name]) 04/21/2021   Acute on chronic systolic (congestive) heart failure ([name]) 02/26/2020   Amiodarone  toxicity    Anemia    Atrial flutter ([name])    Biventricular congestive heart failure ([name])    Candida parapsilosis infection 12/26/2023   Chronic hypoxemic respiratory failure ([name])     Class 3 severe obesity due to excess calories with serious comorbidity and body mass index (BMI) of 50.0 to 59.9 in adult 02/26/2020   ESRD on hemodialysis ([name])    Essential hypertension 02/26/2020   GERD without esophagitis 02/26/2020   Hidradenitis suppurativa 02/26/2020   NICM (nonischemic cardiomyopathy) ([name])    Obesity hypoventilation syndrome ([name])    OSA (obstructive sleep apnea)    PAF (paroxysmal atrial fibrillation) ([name])    Pneumonia    Prediabetes 02/26/2020   Assessment: Patient admitted with hypotension suspecting secondary to low EF. Hx of Afib on Eliquis  PTA, last dose 6/10 AM. Currently in Afib on CRRT - limited evidence for the use of DOACs while on CRRT. With hx of HIT - will utilize argatroban  for now for anticoagulation. HgB 9.2 and PLTs 293.  aPTT remians slightly supra-therapeutic at 96 with argatroban  off. No issues noted. T-bili continues to increase to 19.7 no clear cause, CCM suspecting sepsis indcued. No recent baseline aPTT. INR on admission was 2.9. Question underlying coagulopathy or  slow to clear argatroban .  Goal of Therapy:  aPTT 50-90 seconds Monitor platelets by anticoagulation protocol: Yes   Plan:  Hold argatroban  and repeat aPTT in  - plan to restart when aPTT trends down Monitor H/H and for s/sx of bleeding.    Lenard Quam, PharmD, BCPS, BCCCP Please refer to [facility] for [facility] Pharmacy numbers 01/07/2024 8:40 AM

## 2024-01-07 NOTE — Progress Notes (Signed)
 CCM Progress Note  Patient Details Name: Phillip Young MRN: [id number] DOB: 06-23-1986  LOS: 4  Subjective   States that he feels better today.   6/10 Patient discharged from [facility] and brought to [facility] ED d/t cellulitis  He was being treated with vancomycin . CRRT started d/t pressor needs.Hx: ESRD, noncompliance, CHF 6/13 Spoke to heart failure.  Not a candidate for advanced therapies.  Not a candidate for tailored hemodynamics.  Recommend a strategy of vasopressor support at fluid removal in an attempt to transition back to IHD 6/14 weaning pressors.          Assessment and Plan Cardiovascular and Mediastinum Permanent atrial fibrillation [facility]) Assessment & Plan Unable to tolerate amiodarone .   On exam: rate controlled atrial fibrillation.   - Resume home digoxin  on HD days.   Biventricular congestive heart failure ([name]) Assessment & Plan Known NICM. Echo shows severe RV and LV failure with EF <20%. Non-compliant.   On exam: HS normal. + dependent edema.  - Per HF service, not a candidate for advance therapies.  - No a candidate for GDMT due to hypotension, cannot take SGLT-2 due to skin infection risk.  - Goal is to decompress heart and hopefully get off pressors.  - If unable to wean off pressors, hospice care will be only reasonable option.   Musculoskeletal and Integument Skin ulceration ([name]) Assessment & Plan Chronic skin ulceration. Patient refusing dressing changes.   On examination: multiple areas of covered wound but no drainage or discoloration.   - Complete course of vancomycin  as prescribed from prior admission.   Genitourinary ESRD on hemodialysis [facility]) Assessment & Plan Remains on CRRT.  Has pattern of developing fluid overload and hypotension due to inability to tolerate IHD with inadequate fluid removal.   On examination: dependent edema only. -7 L since admission.  Improving hyponatremia 133.  - Continue CRRT per nephrology. UF -100/-200  per hour. - Resume home phosphate binders. - Resume EPO  Other Chronic pain disorder Assessment & Plan Feels well today.   - Continue home oxycodone  gabapentin   Obesity hypoventilation syndrome ([name]) Assessment & Plan Intermittently compliant with home BIPAP.  On examination, morbid obesity with increased neck girth. Chest clear.  - Encourage BiPAP use at night.   * Shock [facility]) Assessment & Plan Remains on low dose norepinephrine , slowly weaning. On home midodrine .   On exam, awake and extremities warm.   - Wean NE off, keep MAP >65.   Best Practice (right click and Reselect all SmartList Selections daily)   Diet/type: Regular consistency (see orders) DVT prophylaxis: SCDs Start: 01/03/24 1619   Pressure ulcers present: present on admission  GI prophylaxis: N/A Lines: Dialysis Catheter Foley:  N/A Code Status:  full code Last date of multidisciplinary goals of care discussion [pending]  CRITICAL CARE Performed by: Arlina Lair   Total critical care time: 45 minutes  Critical care time was exclusive of separately billable procedures and treating other patients.  Critical care was necessary to treat or prevent imminent or life-threatening deterioration.  Critical care was time spent personally by me on the following activities: development of treatment plan with patient and/or surrogate as well as nursing, discussions with consultants, evaluation of patient's response to treatment, examination of patient, obtaining history from patient or surrogate, ordering and performing treatments and interventions, ordering and review of laboratory studies, ordering and review of radiographic studies, pulse oximetry, re-evaluation of patient's condition and participation in multidisciplinary rounds.  Arlina Lair, MD [facility] ICU Physician [facility]  Critical Care  Pager: [phone] Mobile: [phone] After hours: [phone].

## 2024-01-07 NOTE — Assessment & Plan Note (Addendum)
 Remains on CRRT.  Has pattern of developing fluid overload and hypotension due to inability to tolerate IHD with inadequate fluid removal.   On examination: dependent edema only. -7 L since admission.  Improving hyponatremia 133.  - Continue CRRT per nephrology. UF -100/-200 per hour. - Resume home phosphate binders. - Resume EPO

## 2024-01-08 DIAGNOSIS — R579 Shock, unspecified: Secondary | ICD-10-CM | POA: Diagnosis not present

## 2024-01-08 DIAGNOSIS — I4821 Permanent atrial fibrillation: Secondary | ICD-10-CM | POA: Diagnosis not present

## 2024-01-08 DIAGNOSIS — I5021 Acute systolic (congestive) heart failure: Secondary | ICD-10-CM | POA: Diagnosis not present

## 2024-01-08 DIAGNOSIS — D72829 Elevated white blood cell count, unspecified: Secondary | ICD-10-CM | POA: Diagnosis not present

## 2024-01-08 LAB — CBC
HCT: 28.7 % — ABNORMAL LOW (ref 39.0–52.0)
Hemoglobin: 9.3 g/dL — ABNORMAL LOW (ref 13.0–17.0)
MCH: 29.7 pg (ref 26.0–34.0)
MCHC: 32.4 g/dL (ref 30.0–36.0)
MCV: 91.7 fL (ref 80.0–100.0)
Platelets: 261 10*3/uL (ref 150–400)
RBC: 3.13 MIL/uL — ABNORMAL LOW (ref 4.22–5.81)
RDW: 18.6 % — ABNORMAL HIGH (ref 11.5–15.5)
WBC: 19.3 10*3/uL — ABNORMAL HIGH (ref 4.0–10.5)
nRBC: 0 % (ref 0.0–0.2)

## 2024-01-08 LAB — RENAL FUNCTION PANEL
Albumin: 1.7 g/dL — ABNORMAL LOW (ref 3.5–5.0)
Albumin: 1.7 g/dL — ABNORMAL LOW (ref 3.5–5.0)
Anion gap: 12 (ref 5–15)
Anion gap: 11 (ref 5–15)
BUN: 17 mg/dL (ref 6–20)
BUN: 16 mg/dL (ref 6–20)
CO2: 21 mmol/L — ABNORMAL LOW (ref 22–32)
CO2: 24 mmol/L (ref 22–32)
Calcium: 8.8 mg/dL — ABNORMAL LOW (ref 8.9–10.3)
Calcium: 9.1 mg/dL (ref 8.9–10.3)
Chloride: 99 mmol/L (ref 98–111)
Chloride: 96 mmol/L — ABNORMAL LOW (ref 98–111)
Creatinine, Ser: 1.75 mg/dL — ABNORMAL HIGH (ref 0.61–1.24)
Creatinine, Ser: 1.68 mg/dL — ABNORMAL HIGH (ref 0.61–1.24)
GFR, Estimated: 50 mL/min — ABNORMAL LOW (ref >60)
GFR, Estimated: 53 mL/min — ABNORMAL LOW (ref >60)
Glucose, Bld: 115 mg/dL — ABNORMAL HIGH (ref 70–99)
Glucose, Bld: 104 mg/dL — ABNORMAL HIGH (ref 70–99)
Phosphorus: 2.5 mg/dL (ref 2.5–4.6)
Phosphorus: 2.4 mg/dL — ABNORMAL LOW (ref 2.5–4.6)
Potassium: 3.8 mmol/L (ref 3.5–5.1)
Potassium: 3.9 mmol/L (ref 3.5–5.1)
Sodium: 132 mmol/L — ABNORMAL LOW (ref 135–145)
Sodium: 131 mmol/L — ABNORMAL LOW (ref 135–145)

## 2024-01-08 LAB — COMPREHENSIVE METABOLIC PANEL WITH GFR
ALT: 39 U/L (ref 0–44)
AST: 87 U/L — ABNORMAL HIGH (ref 15–41)
Albumin: 1.7 g/dL — ABNORMAL LOW (ref 3.5–5.0)
Alkaline Phosphatase: 342 U/L — ABNORMAL HIGH (ref 38–126)
Anion gap: 9 (ref 5–15)
BUN: 17 mg/dL (ref 6–20)
CO2: 23 mmol/L (ref 22–32)
Calcium: 8.8 mg/dL — ABNORMAL LOW (ref 8.9–10.3)
Chloride: 99 mmol/L (ref 98–111)
Creatinine, Ser: 1.7 mg/dL — ABNORMAL HIGH (ref 0.61–1.24)
GFR, Estimated: 52 mL/min — ABNORMAL LOW (ref >60)
Glucose, Bld: 116 mg/dL — ABNORMAL HIGH (ref 70–99)
Potassium: 3.8 mmol/L (ref 3.5–5.1)
Sodium: 131 mmol/L — ABNORMAL LOW (ref 135–145)
Total Bilirubin: 20.7 mg/dL (ref 0.0–1.2)
Total Protein: 6.5 g/dL (ref 6.5–8.1)

## 2024-01-08 LAB — CULTURE, BLOOD (ROUTINE X 2)
Culture: NO GROWTH
Culture: NO GROWTH

## 2024-01-08 LAB — MAGNESIUM: Magnesium: 2.3 mg/dL (ref 1.7–2.4)

## 2024-01-08 LAB — APTT
aPTT: 81 s — ABNORMAL HIGH (ref 24–36)
aPTT: 80 s — ABNORMAL HIGH (ref 24–36)
aPTT: 71 s — ABNORMAL HIGH (ref 24–36)

## 2024-01-08 LAB — VANCOMYCIN, TROUGH: Vancomycin Tr: 26 ug/mL (ref 15–20)

## 2024-01-08 MED ORDER — VANCOMYCIN HCL 1.25 G IV SOLR
1250.0000 mg | INTRAVENOUS | Status: DC
  Filled 2024-01-08 (×2): qty 25

## 2024-01-08 MED ORDER — HYDROXYZINE HCL 10 MG PO TABS
10.0000 mg | ORAL_TABLET | Freq: Four times a day (QID) | ORAL | Status: DC | PRN
  Administered 2024-01-08 – 2024-01-12 (×4): 10 mg via ORAL
  Filled 2024-01-08 (×6): qty 1

## 2024-01-08 MED ORDER — HYDROMORPHONE HCL 1 MG/ML IJ SOLN
2.0000 mg | INTRAMUSCULAR | Status: DC | PRN
  Administered 2024-01-08 – 2024-01-13 (×23): 2 mg via INTRAVENOUS
  Filled 2024-01-08 (×23): qty 2

## 2024-01-08 MED ORDER — HYDROXYZINE HCL 50 MG/ML IM SOLN
25.0000 mg | Freq: Once | INTRAMUSCULAR | Status: AC
  Administered 2024-01-08: 25 mg via INTRAMUSCULAR
  Filled 2024-01-08: qty 0.5

## 2024-01-08 MED ORDER — HYDROMORPHONE HCL 1 MG/ML IJ SOLN
1.0000 mg | Freq: Once | INTRAMUSCULAR | Status: AC
  Administered 2024-01-08: 1 mg via INTRAVENOUS
  Filled 2024-01-08: qty 1

## 2024-01-08 NOTE — Assessment & Plan Note (Signed)
 Chronic skin ulceration. Patient refusing dressing changes.  On examination: multiple areas of covered wound but no drainage or discoloration.  Complete course of vancomycin  as prescribed from prior admission.

## 2024-01-08 NOTE — Assessment & Plan Note (Signed)
 Unable to tolerate amiodarone .  Resume home digoxin  every 48h while on CRRT Monitor digoxin  levels for toxicity

## 2024-01-08 NOTE — Progress Notes (Signed)
 NAME:  Phillip Young, MRN:  [id number], DOB:  Oct 05, 1985, LOS: 5 ADMISSION DATE:  01/03/2024, CONSULTATION DATE:  01/03/24 REFERRING MD:  Dr. Val Garin, CHIEF COMPLAINT:  SOB/ chills   History of Present Illness:   25 yoM w/PMH as below significant for biventricular HF (EF < 20%), chronic afib on Eliquis , chronic hypotension on midodrine  30mg  TID, ESRD, chronic hypoxic and hypercarbic respiratory failure on NIV, morbid obesity, medical non-compliance, and chronic pain presenting from home after being discharged from [facility] 6/9 with SOB, chills, subjective fever, ongoing poor PO, vomiting today strings of blood but then reported it was with coughing episodes, pain all over, and increased weeping from left thigh blister.  Had BM, unclear on consistency/ color.  Reports last dialysis yesterday.  Initially reports not taking any PO meds today but then said he did this morning.  Non- ambulatory at baseline.  Frequent admits, most recently 5/13-5/22 for recurrent septic shock and MRSE bacteremia due to suspected line complicated by acute on chronic HF and respiratory failure, suspected IE but not a candidate for TEE, discharged on vancomycin  with HD scheduled thru 6/30.  TDC was replaced 5/22 after 2 day line holiday.  Also started on micafungin  w/HD at [facility] after [facility] grew candida species, scheduled thru 6/30.   In ER, temp 99.1, on HFNC at 6L, with SBP into 70's requiring NE support after 500 ml bolus.  Labs significant for Na 129, K 6.3, Cl 89, alk phos 346, AST 84, t.bili 12 (recent 3.4 on 5/14), lactic 3.8, WBC 20.6 (previously 8.1 on 5/22), INR 2.9, CXR showing vascular congestion and increasing congestion and pleural effusions.  BC sent and empirically started on linezolid , flagyl , and cefepime .  Pending CT chest/abd/ pelvis.  Nephrology consulted.  PCCM called for admit.   Pertinent  Medical History   Past Medical History:  Diagnosis Date   Acute on chronic respiratory failure with hypoxia ([name])  04/21/2021   Acute on chronic systolic (congestive) heart failure ([name]) 02/26/2020   Amiodarone  toxicity    Anemia    Biventricular congestive heart failure ([name])    Candida parapsilosis infection 12/26/2023   Chronic hypoxemic respiratory failure ([name])    Class 3 severe obesity due to excess calories with serious comorbidity and body mass index (BMI) of 50.0 to 59.9 in adult 02/26/2020   ESRD on hemodialysis [facility])    Essential hypertension 02/26/2020   GERD without esophagitis 02/26/2020   Hidradenitis suppurativa 02/26/2020   NICM (nonischemic cardiomyopathy) ([name])    Obesity hypoventilation syndrome ([name])    OSA (obstructive sleep apnea)    Pneumonia    Prediabetes 02/26/2020   Significant Hospital Events: Including procedures, antibiotic start and stop dates in addition to other pertinent events   6/10 Admit 6/12 Remains on vasopressors.  Spoke to heart failure.  Not a candidate for advanced therapies.  Not a candidate for tailored hemodynamics.  Recommend a strategy of vasopressor support at fluid removal in an attempt to transition back to IHD.  Interim History / Subjective:   Patient has been expressing desire to limit care .   Objective    Blood pressure (!) 90/59, pulse (!) 101, temperature 99.2 F (37.3 C), temperature source Axillary, resp. rate (!) 26, height 6' (1.829 m), weight (!) 164.7 kg, SpO2 93%.        Intake/Output Summary (Last 24 hours) at 01/08/2024 1319 Last data filed at 01/08/2024 1233 Gross per 24 hour  Intake 2030.82 ml  Output 6046 ml  Net -4015.18 ml  Filed Weights   01/06/24 0407 01/07/24 0500 01/08/24 0500  Weight: (!) 171.3 kg (!) 170.9 kg (!) 164.7 kg   Examination: General: Chronically ill-appearing obese male lying in bed, appears uncomfortable. HEENT: Voice is hoarse. Neuro: Awake and appropriate with no focal deficits. CV: Heart sounds are unremarkable.  Remains in atrial fibrillation. PULM: Chest is clear to auscultation  bilaterally. GI: Abdomen is soft. Extremities: Chronic lower extremity brawny edema. ++ dependent edema. Skin: Skin wounds are clean.   Ancillary test personally reviewed  Hyponatremia 132.  WBC 19 Echo shows severe biventricular failure.  Assessment and Plan   Shock ([name]) Remains on low dose norepinephrine , slowly weaning. On home midodrine .  Continue to wean NE to off. Keep MAP > 65   Biventricular congestive heart failure ([name]) Known NICM. Echo shows severe RV and LV failure with EF <20%. Non-compliant. Per HF service, not a candidate for advance therapies.  No a candidate for GDMT due to hypotension, cannot take SGLT-2 due to skin infection risk.  Goal is to decompress heart and hopefully get off pressors.  If unable to wean off pressors, hospice care will be only reasonable option.   ESRD on hemodialysis ([name]) Remains on CRRT.  Has pattern of developing fluid overload and hypotension due to inability to tolerate IHD with inadequate fluid removal Continue CRRT per nephrology. UF decreased to allow for pressor weaning Resume home phosphate binders. Resume EPO  Obesity hypoventilation syndrome ([name]) Intermittently compliant with home BIPAP. Encourage BiPAP use at night.   Permanent atrial fibrillation ([name]) Unable to tolerate amiodarone .  Resume home digoxin  every 48h while on CRRT Monitor digoxin  levels for toxicity   Chronic pain disorder Feels well today.   Continue home oxycodone  gabapentin   Skin ulceration ([name]) Chronic skin ulceration. Patient refusing dressing changes.  On examination: multiple areas of covered wound but no drainage or discoloration.  Complete course of vancomycin  as prescribed from prior admission.    Best Practice (right click and Reselect all SmartList Selections daily)   Diet/type: NPO w/ oral meds DVT prophylaxis SCD Pressure ulcer(s): present on admission  GI prophylaxis: PPI Lines: Dialysis Catheter-pta R TDC (12/15/23) Foley:   N/A Code Status:  full code Last date of multidisciplinary goals of care discussion - Will consult PMT again as patient has expressed that there are certain interventions that he wishes to forego.   CRITICAL CARE Performed by: Arlina Lair   Total critical care time: 45 minutes  Critical care time was exclusive of separately billable procedures and treating other patients.  Critical care was necessary to treat or prevent imminent or life-threatening deterioration.  Critical care was time spent personally by me on the following activities: development of treatment plan with patient and/or surrogate as well as nursing, discussions with consultants, evaluation of patient's response to treatment, examination of patient, obtaining history from patient or surrogate, ordering and performing treatments and interventions, ordering and review of laboratory studies, ordering and review of radiographic studies, pulse oximetry, re-evaluation of patient's condition and participation in multidisciplinary rounds.  Arlina Lair, MD [facility] ICU Physician [facility]  Critical Care  Pager: [phone] Mobile: [phone] After hours: [phone].

## 2024-01-08 NOTE — Progress Notes (Addendum)
 PHARMACY - ANTICOAGULATION  Pharmacy Consult for argatroban   Indication: atrial fibrillation  Brief A/P: aPTT still elevated-holding anticoagulation for now  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, SRA positive   Metoprolol  Other (See Comments)    near syncope   Other Swelling and Other (See Comments)    Steroids Fluid seeping out of legs Nebulizer(unknown name of neb) elevated pressure    Tramadol      unknown    Patient Measurements: Height: 6' (182.9 cm) Weight: (!) 164.7 kg (363 lb 1.6 oz) IBW/kg (Calculated) : 77.6 HEPARIN  DW (KG): 125.9  Vital Signs: Temp: 99.2 F (37.3 C) (06/15 1129) Temp Source: Axillary (06/15 1129) BP: 90/59 (06/15 1130) Pulse Rate: 101 (06/15 1145)  Labs: Recent Labs    01/06/24 0442 01/06/24 0443 01/07/24 0246 01/07/24 0252 01/07/24 1707 01/07/24 1708 01/07/24 2221 01/08/24 0107 01/08/24 0451 01/08/24 0453  HGB 9.3*  --  9.3*  --   --   --   --   --  9.3*  --   HCT 28.7*  --  29.2*  --   --   --   --   --  28.7*  --   PLT 280  --  275  --   --   --   --   --  261  --   APTT  --    < > 96*   < >  --    < > 81* 81* 80*  --   CREATININE 2.19*   < >  --    < > 1.92*  --   --   --  1.70* 1.75*   < > = values in this interval not displayed.    Estimated Creatinine Clearance: 91 mL/min (A) (by C-G formula based on SCr of 1.75 mg/dL (H)).  Assessment: 38 y.o. male with h/o Afib, Eliquis  on hold, for argatroban  due to h/o HIT. Remains on CRRT.   aPTT still elevated at 80 - slow to trend down.  Goal of Therapy:  aPTT 50-90 seconds Monitor platelets by anticoagulation protocol: Yes   Plan:  Holding anticoagulation for now Follow-up pm laPTT  Lenard Quam, PharmD, BCPS, BCCCP Please refer to  [facility] for [facility] Pharmacy numbers 01/08/2024 12:58 PM

## 2024-01-08 NOTE — Progress Notes (Signed)
 Pharmacy Antibiotic Note  Phillip Young is a 38 y.o. male admitted on 01/03/2024 with hx of MRSE bacteremia presenting with new hypotension, initial concern for sepsis.  Pharmacy has been consulted for vancomycin  dosing. Patient ERSD, now on CRRT.   Vancomycin  level (ongoing CRRT) elevated above goal at 26 (goal 15-20). Dose given after trough drawn -accurate trough. CRRT remains running will without issues noted. Effluent rate reading high at 118 ml/kg/hr currently.   Plan: Reduce Vancomycin  to 1250mg  IV daily  while on CRRT  - delay next dose until tomorrow PM to allow level to fall  Planned EOT 6/30 per ID for presumed MRSE endocarditis.  Monitor for transition back to HD to adjust vancomycin .   Height: 6' (182.9 cm) Weight: (!) 164.7 kg (363 lb 1.6 oz) IBW/kg (Calculated) : 77.6  Temp (24hrs), Avg:98.2 F (36.8 C), Min:97.4 F (36.3 C), Max:99.2 F (37.3 C)  Recent Labs  Lab 01/03/24 1508 01/04/24 0250 01/04/24 0251 01/05/24 0347 01/05/24 0348 01/06/24 0442 01/06/24 0443 01/06/24 1550 01/07/24 0246 01/07/24 0252 01/07/24 1707 01/08/24 0451 01/08/24 0453 01/08/24 1035  WBC  --  18.7*  --  20.7*  --  19.2*  --   --  17.7*  --   --  19.3*  --   --   CREATININE  --  5.45*   < > 3.00*   < > 2.19*   < > 1.95*  --  2.02* 1.92* 1.70* 1.75*  --   LATICACIDVEN 3.8*  --   --   --   --   --   --   --   --   --   --   --   --   --   VANCOTROUGH  --   --   --   --   --   --   --   --   --   --   --   --   --  26*   < > = values in this interval not displayed.    Estimated Creatinine Clearance: 91 mL/min (A) (by C-G formula based on SCr of 1.75 mg/dL (H)).    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, SRA positive   Metoprolol  Other (See Comments)     near syncope   Other Swelling and Other (See Comments)    Steroids Fluid seeping out of legs Nebulizer(unknown name of neb) elevated pressure    Tramadol      unknown    Antimicrobials this admission: Vancomycin  5/14 >> planned EOT 6/30  Cefepime  6/10 >> 6/11   Flagyl  6/10 >> 6/11  Micafungin  5/24 >> 6/5 at OSH, 6/11 >  S/p 1x linezolid  6/10  Microbiology results: 6/10 BCx: NGTD x < 24h  6/10 MRSA PCR: negative  5/25 Bcx: NGTD x final (OSH)  5/20 Bcx: 1/2 Candida tropicalis, Candida parapsilosis  5/13 Bcx: MRSE 2/2   Thank you for allowing pharmacy to be a part of this patient's care.Lenard Quam, PharmD, BCPS, BCCCP Please refer to [facility] for [facility] Pharmacy numbers 01/08/2024 12:39 PM

## 2024-01-08 NOTE — Progress Notes (Addendum)
 [facility] NEPHROLOGY PROGRESS NOTE  Assessment/ Plan: Pt is a 38 y.o. yo male with past medical history of biventricular heart failure EF less than 20, chronic A-fib, ESRD on HD, chronic hypotension on high-dose midodrine , nonadherence with outpatient HD and treatment, recently at [facility] presented with shock and volume overload.  # Shock, cardiogenic:  12/06/23 EF < 20%. Sepsis less likely with neg cultures, abx off. Managing UF with HD.  Currently on Levophed  with dose titration.   # ESRD:  on HD since 01/2021.  Was [facility] MWF, noncompliant with outpatient HD with chronic hyperkalemia.  Currently on CRRT, UF as tolerated - decrease to  50- 100 cc an hour net negative.  Does not appear hemodynamics will allow iHD transition at this point.  ? If we are coming to an impasse.   # Anemia of ESRD: Dosed Aranesp .  Transfuse as needed.  # Secondary hyperparathyroidism/hyperphosphatemia: Currently on CRRT.  Follow labs.  # Chronic hypotension/volume: On Levophed  and midodrine  for shock and receiving midodrine  to facilitate UF.  # a fib: usually rate controlled, this AM low 100s. Decrease UF rate today.   #HIT: avoiding heparin   GOC:  pt with poor prognosis and at times even refusing basic care, not abiding by fluid restriction.  We talked frankly today - he doesn't want to be comfort care but does have some specific limits to care - no trach, if CVA or MI no further intervention.  Would re engage with pall care re: long term GOC.  Despite young age his long term prognosis and options are fairly dismal from what I can see.  He may have no other option than comfort care if unable to be liberated from CRRT.    Subjective: Seen and examined.  Tolerating CRRT I/Os yest 2.0 /5.6 net net 11 for admission.  No problem with CRRT filter.  Discussed with ICU nurse.   Objective Vital signs in last 24 hours: Vitals:   01/08/24 0645 01/08/24 0700 01/08/24 0715 01/08/24 0716  BP:  (!) 69/41     Pulse: (!) 115 (!) 116 (!) 109   Resp: (!) 22 17 14    Temp:    (!) 97.4 F (36.3 C)  TempSrc:    Oral  SpO2: 94% 91% 96%   Weight:      Height:       Weight change: -6.2 kg  Intake/Output Summary (Last 24 hours) at 01/08/2024 0951 Last data filed at 01/08/2024 0900 Gross per 24 hour  Intake 1788.63 ml  Output 5915 ml  Net -4126.37 ml       Labs: RENAL PANEL Recent Labs  Lab 01/04/24 0250 01/04/24 0251 01/05/24 0347 01/05/24 0348 01/06/24 0442 01/06/24 0443 01/06/24 1550 01/07/24 0252 01/07/24 1707 01/08/24 0451 01/08/24 0453  NA 132*   < > 130*   < > 132* 131* 132* 133* 133* 131* 132*  K 5.8*   < > 4.7   < > 4.2 4.1 4.1 3.9 4.1 3.8 3.8  CL 94*   < > 98   < > 99 98 97* 99 97* 99 99  CO2 22   < > 23   < > 23 23 22 22 24 23  21*  GLUCOSE 88   < > 100*   < > 88 86 120* 92 97 116* 115*  BUN 52*   < > 28*   < > 21* 19 18 19 19 17 17   CREATININE 5.45*   < > 3.00*   < >  2.19* 2.06* 1.95* 2.02* 1.92* 1.70* 1.75*  CALCIUM  9.1   < > 8.8*   < > 8.9 8.8* 8.9 9.1 9.0 8.8* 8.8*  MG 2.3  --  2.4  --  2.4  --   --  2.3  --  2.3  --   PHOS  --    < >  --    < >  --  3.4 2.9 2.6 2.6  --  2.5  ALBUMIN  2.0*   < > 2.1*   < > 1.9* 1.9* 1.8* 1.8* 1.8* 1.7* 1.7*   < > = values in this interval not displayed.    Liver Function Tests: Recent Labs  Lab 01/06/24 0442 01/06/24 0443 01/07/24 0252 01/07/24 1707 01/08/24 0451 01/08/24 0453  AST 83*  --  89*  --  87*  --   ALT 36  --  36  --  39  --   ALKPHOS 340*  --  290*  --  342*  --   BILITOT 18.6*  --  19.7*  --  20.7*  --   PROT 6.4*  --  6.5  --  6.5  --   ALBUMIN  1.9*   < > 1.8* 1.8* 1.7* 1.7*   < > = values in this interval not displayed.   No results for input(s): LIPASE, AMYLASE in the last 168 hours. No results for input(s): AMMONIA in the last 168 hours. CBC: Recent Labs    02/21/23 1538 02/22/23 0209 04/14/23 0314 04/15/23 1054 05/19/23 2020 05/20/23 1422 08/31/23 0348 09/01/23 0343 01/04/24 0250  01/05/24 0347 01/06/24 0442 01/07/24 0246 01/08/24 0451  HGB  --    < >  --    < >  --    < > 10.3*   < > 9.4* 9.2* 9.3* 9.3* 9.3*  MCV  --    < >  --    < >  --    < > 100.6*   < > 88.5 90.3 91.1 90.7 91.7  VITAMINB12  --   --  1,014*  --   --   --  815  --   --   --   --   --   --   FOLATE 12.0  --   --   --   --   --  7.9  --   --   --   --   --   --   FERRITIN  --   --   --   --  359*  --  241  --   --   --   --   --   --   TIBC  --   --   --   --  211*  --  364  --   --   --   --   --   --   IRON  --   --   --   --  64  --  58  --   --   --   --   --   --    < > = values in this interval not displayed.    Cardiac Enzymes: No results for input(s): CKTOTAL, CKMB, CKMBINDEX, TROPONINI in the last 168 hours. CBG: Recent Labs  Lab 01/03/24 1740 01/06/24 2325  GLUCAP 128* 88    Iron Studies: No results for input(s): IRON, TIBC, TRANSFERRIN, FERRITIN in the last 72 hours. Studies/Results: No results found.   Medications: Infusions:  anticoagulant sodium  citrate     norepinephrine  (LEVOPHED ) Adult infusion 7 mcg/min (01/08/24 0900)   PrismaSol  BGK 2/3.5 400 mL/hr at 01/08/24 0505   PrismaSol  BGK 2/3.5 400 mL/hr at 01/08/24 1610   prismasol  BGK 4/2.5 2,000 mL/hr at 01/08/24 0757   vancomycin  Stopped (01/07/24 1120)    Scheduled Medications:  Chlorhexidine  Gluconate Cloth  6 each Topical Daily   [START ON 01/09/2024] cinacalcet   30 mg Oral Q M,W,F   Darbepoetin Alfa   150 mcg Intravenous Q7 days   digoxin   0.125 mg Oral Q48H   feeding supplement (NEPRO CARB STEADY)  237 mL Oral BID BM   leptospermum manuka honey  1 Application Topical Daily   levothyroxine   25 mcg Oral Q0600   liver oil-zinc  oxide   Topical BID   midodrine   30 mg Oral Q8H   multivitamin  1 tablet Oral QHS   mouth rinse  15 mL Mouth Rinse 4 times per day   polyethylene glycol  17 g Oral Daily   pregabalin   50 mg Oral Daily   senna  1 tablet Oral Daily   sevelamer  carbonate  1,600 mg Oral  TID WC   venlafaxine  XR  37.5 mg Oral Daily   zinc  sulfate (50mg  elemental zinc )  220 mg Oral Daily    have reviewed scheduled and prn medications.  Physical Exam: General:NAD, lying flat, sleepy but arousable Heart:RRR, s1s2 nl Lungs: Bilateral distant breath sound Abdomen:soft, mildly distended Extremities: 1-2+ Peripheral edema Dialysis Access: TDC  Makyia Erxleben A Raydell Maners 01/08/2024,9:51 AM  LOS: 5 days

## 2024-01-08 NOTE — Progress Notes (Signed)
 This RN had a long educational meeting with patient at bedside about wound care, cleanliness, and importance of repositioning, baths, and clean linens. Patient agreed to let us  bathe him, change his dressings, and linens. New wounds and skin tears found, photographed, and documented in chart. Patient viewed pictures and has now agreed to be more compliant with his care to prevent future breakdown.  Brenlynn Fake C Rakeisha Nyce

## 2024-01-08 NOTE — Assessment & Plan Note (Signed)
 Remains on CRRT.  Has pattern of developing fluid overload and hypotension due to inability to tolerate IHD with inadequate fluid removal Continue CRRT per nephrology. UF decreased to allow for pressor weaning Resume home phosphate binders. Resume EPO

## 2024-01-08 NOTE — Progress Notes (Signed)
 eLink Physician-Brief Progress Note Patient Name: Wilborn Membreno DOB: 23-May-1986 MRN: [id number]   Date of Service  01/08/2024  HPI/Events of Note  Itching, Says sarna lotion does not help  eICU Interventions  Hydroxyzine  x1     Intervention Category Minor Interventions: Routine modifications to care plan (e.g. PRN medications for pain, fever)  Karrissa Parchment 01/08/2024, 12:08 AM

## 2024-01-08 NOTE — Assessment & Plan Note (Signed)
 Intermittently compliant with home BIPAP. Encourage BiPAP use at night.

## 2024-01-08 NOTE — Assessment & Plan Note (Signed)
 Remains on low dose norepinephrine , slowly weaning. On home midodrine .  Continue to wean NE to off. Keep MAP > 65

## 2024-01-08 NOTE — Assessment & Plan Note (Signed)
 Feels well today.   Continue home oxycodone  gabapentin 

## 2024-01-08 NOTE — Assessment & Plan Note (Signed)
 Known NICM. Echo shows severe RV and LV failure with EF <20%. Non-compliant. Per HF service, not a candidate for advance therapies.  No a candidate for GDMT due to hypotension, cannot take SGLT-2 due to skin infection risk.  Goal is to decompress heart and hopefully get off pressors.  If unable to wean off pressors, hospice care will be only reasonable option.

## 2024-01-08 NOTE — Progress Notes (Signed)
 PHARMACY - ANTICOAGULATION  Pharmacy Consult for argatroban   Indication: atrial fibrillation  Brief A/P: aPTT still elevated-holding anticoagulation for now  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, SRA positive   Metoprolol  Other (See Comments)    near syncope   Other Swelling and Other (See Comments)    Steroids Fluid seeping out of legs Nebulizer(unknown name of neb) elevated pressure    Tramadol      unknown    Patient Measurements: Height: 6' (182.9 cm) Weight: (!) 164.7 kg (363 lb 1.6 oz) IBW/kg (Calculated) : 77.6 HEPARIN  DW (KG): 125.9  Vital Signs: Temp: 97.5 F (36.4 C) (06/15 1510) Temp Source: Axillary (06/15 1510) BP: 90/57 (06/15 1600) Pulse Rate: 112 (06/15 1600)  Labs: Recent Labs    01/06/24 0442 01/06/24 0443 01/07/24 0246 01/07/24 0252 01/08/24 0107 01/08/24 0451 01/08/24 0453 01/08/24 1624  HGB 9.3*  --  9.3*  --   --  9.3*  --   --   HCT 28.7*  --  29.2*  --   --  28.7*  --   --   PLT 280  --  275  --   --  261  --   --   APTT  --    < > 96*   < > 81* 80*  --  71*  CREATININE 2.19*   < >  --    < >  --  1.70* 1.75* 1.68*   < > = values in this interval not displayed.    Estimated Creatinine Clearance: 94.8 mL/min (A) (by C-G formula based on SCr of 1.68 mg/dL (H)).  Assessment: 38 y.o. male with h/o Afib, Eliquis  on hold, for argatroban  due to h/o HIT. Remains on CRRT.   aPTT still elevated at 71 - slow to trend down.  Goal of Therapy:  aPTT 50-90 seconds Monitor platelets by anticoagulation protocol: Yes   Plan:  Holding anticoagulation for now Follow-up AM aPTT  Joanell Mowers, Davey Erp, [facility] Clinical Pharmacist  01/08/2024 5:15 PM   [facility] pharmacy phone numbers are listed on  [email or web address]

## 2024-01-09 ENCOUNTER — Inpatient Hospital Stay (HOSPITAL_COMMUNITY)

## 2024-01-09 DIAGNOSIS — Z7189 Other specified counseling: Secondary | ICD-10-CM

## 2024-01-09 DIAGNOSIS — R1084 Generalized abdominal pain: Secondary | ICD-10-CM

## 2024-01-09 DIAGNOSIS — I5082 Biventricular heart failure: Secondary | ICD-10-CM

## 2024-01-09 DIAGNOSIS — R579 Shock, unspecified: Secondary | ICD-10-CM

## 2024-01-09 DIAGNOSIS — Z992 Dependence on renal dialysis: Secondary | ICD-10-CM

## 2024-01-09 DIAGNOSIS — I5043 Acute on chronic combined systolic (congestive) and diastolic (congestive) heart failure: Secondary | ICD-10-CM | POA: Diagnosis not present

## 2024-01-09 DIAGNOSIS — E662 Morbid (severe) obesity with alveolar hypoventilation: Secondary | ICD-10-CM

## 2024-01-09 DIAGNOSIS — Z515 Encounter for palliative care: Secondary | ICD-10-CM

## 2024-01-09 DIAGNOSIS — N186 End stage renal disease: Secondary | ICD-10-CM

## 2024-01-09 DIAGNOSIS — G894 Chronic pain syndrome: Secondary | ICD-10-CM

## 2024-01-09 LAB — RENAL FUNCTION PANEL
Albumin: 1.7 g/dL — ABNORMAL LOW (ref 3.5–5.0)
Albumin: 1.7 g/dL — ABNORMAL LOW (ref 3.5–5.0)
Anion gap: 11 (ref 5–15)
Anion gap: 10 (ref 5–15)
BUN: 16 mg/dL (ref 6–20)
BUN: 16 mg/dL (ref 6–20)
CO2: 22 mmol/L (ref 22–32)
CO2: 22 mmol/L (ref 22–32)
Calcium: 9.2 mg/dL (ref 8.9–10.3)
Calcium: 8.7 mg/dL — ABNORMAL LOW (ref 8.9–10.3)
Chloride: 99 mmol/L (ref 98–111)
Chloride: 98 mmol/L (ref 98–111)
Creatinine, Ser: 1.65 mg/dL — ABNORMAL HIGH (ref 0.61–1.24)
Creatinine, Ser: 1.6 mg/dL — ABNORMAL HIGH (ref 0.61–1.24)
GFR, Estimated: 54 mL/min — ABNORMAL LOW (ref >60)
GFR, Estimated: 56 mL/min — ABNORMAL LOW (ref >60)
Glucose, Bld: 101 mg/dL — ABNORMAL HIGH (ref 70–99)
Glucose, Bld: 100 mg/dL — ABNORMAL HIGH (ref 70–99)
Phosphorus: 2.5 mg/dL (ref 2.5–4.6)
Phosphorus: 2.3 mg/dL — ABNORMAL LOW (ref 2.5–4.6)
Potassium: 4 mmol/L (ref 3.5–5.1)
Potassium: 4.2 mmol/L (ref 3.5–5.1)
Sodium: 132 mmol/L — ABNORMAL LOW (ref 135–145)
Sodium: 130 mmol/L — ABNORMAL LOW (ref 135–145)

## 2024-01-09 LAB — APTT
aPTT: 61 s — ABNORMAL HIGH (ref 24–36)
aPTT: 57 s — ABNORMAL HIGH (ref 24–36)

## 2024-01-09 LAB — CBC
HCT: 30.2 % — ABNORMAL LOW (ref 39.0–52.0)
Hemoglobin: 9.8 g/dL — ABNORMAL LOW (ref 13.0–17.0)
MCH: 29.6 pg (ref 26.0–34.0)
MCHC: 32.5 g/dL (ref 30.0–36.0)
MCV: 91.2 fL (ref 80.0–100.0)
Platelets: 281 10*3/uL (ref 150–400)
RBC: 3.31 MIL/uL — ABNORMAL LOW (ref 4.22–5.81)
RDW: 19.5 % — ABNORMAL HIGH (ref 11.5–15.5)
WBC: 21.3 10*3/uL — ABNORMAL HIGH (ref 4.0–10.5)
nRBC: 0 % (ref 0.0–0.2)

## 2024-01-09 LAB — MAGNESIUM: Magnesium: 2.3 mg/dL (ref 1.7–2.4)

## 2024-01-09 LAB — AMMONIA: Ammonia: 44 umol/L — ABNORMAL HIGH (ref 9–35)

## 2024-01-09 MED ORDER — IOHEXOL 350 MG/ML SOLN
75.0000 mL | Freq: Once | INTRAVENOUS | Status: AC | PRN
  Administered 2024-01-09: 75 mL via INTRAVENOUS

## 2024-01-09 MED ORDER — VANCOMYCIN HCL 1250 MG/250ML IV SOLN
1250.0000 mg | INTRAVENOUS | Status: DC
  Administered 2024-01-09 – 2024-01-11 (×3): 1250 mg via INTRAVENOUS
  Filled 2024-01-09 (×3): qty 250

## 2024-01-09 NOTE — Progress Notes (Signed)
 eLink Physician-Brief Progress Note Patient Name: Phillip Young DOB: 1986/04/06 MRN: [id number]   Date of Service  01/09/2024  HPI/Events of Note  Despite midodrine  30 mg every 8 hours and norepinephrine , patient is still mildly hypotensive.  CRRT set to 0.  Net negative 2.2 L for 24 hours.  eICU Interventions  Increase norepinephrine  ceiling to 20 mcg peripherally.  May need to consider long-term cvc access depending on goals of care     Intervention Category Intermediate Interventions: Hypotension - evaluation and management  Devyon Keator 01/09/2024, 2:49 AM

## 2024-01-09 NOTE — Progress Notes (Signed)
   01/09/24 0014  BiPAP/CPAP/SIPAP  BiPAP/CPAP/SIPAP Pt Type Adult  Mask Type Full face mask  Dentures removed? Not applicable  Mask Size Large  Flow Rate 6 lpm  Patient Home Machine Yes  Safety Check Completed by RT for Home Unit Yes, no issues noted  Patient Home Mask Yes  Patient Home Tubing Yes  Auto Titrate Yes  Device Plugged into RED Power Outlet Yes  BiPAP/CPAP /SiPAP Vitals  Pulse Rate (!) 114  Resp 20  BP (!) 91/53  SpO2 99 %  MEWS Score/Color  MEWS Score 3  MEWS Score Color Yellow   Patient placed on home BIPAP unit for the night.

## 2024-01-09 NOTE — Progress Notes (Addendum)
 PHARMACY - ANTICOAGULATION  Pharmacy Consult for argatroban   Indication: atrial fibrillation w/ hx of HIT   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, SRA positive   Metoprolol  Other (See Comments)    near syncope   Other Swelling and Other (See Comments)    Steroids Fluid seeping out of legs Nebulizer(unknown name of neb) elevated pressure    Tramadol      unknown    Patient Measurements: Height: 6' (182.9 cm) Weight: (!) 163 kg (359 lb 5.6 oz) IBW/kg (Calculated) : 77.6 HEPARIN  DW (KG): 125.9  Vital Signs: Temp: 98.1 F (36.7 C) (06/16 0353) Temp Source: Axillary (06/16 0353) BP: 111/66 (06/16 0830) Pulse Rate: 112 (06/16 0830)  Labs: Recent Labs    01/07/24 0246 01/07/24 0252 01/08/24 0451 01/08/24 0453 01/08/24 1624 01/09/24 0236  HGB 9.3*  --  9.3*  --   --  9.8*  HCT 29.2*  --  28.7*  --   --  30.2*  PLT 275  --  261  --   --  281  APTT 96*   < > 80*  --  71* 61*  CREATININE  --    < > 1.70* 1.75* 1.68* 1.65*   < > = values in this interval not displayed.    Estimated Creatinine Clearance: 96 mL/min (A) (by C-G formula based on SCr of 1.65 mg/dL (H)).  Assessment: 38 y.o. male with h/o Afib, Eliquis  on hold for CRRT, for argatroban  due to h/o HIT. Remains on CRRT. HgB 9.8 and PLTs 281, aPTT downtrending but remains elevated at 61. No baseline aPTT from this admission, T-bili continues to increase to 20.7, noted yellowing of sclera on exam. Argatroban  off since 6/13 around noon. Unclear if aPTT elevated due to liver dysfunction/congestion, underlying coagulopathy, or due to argatroban . Half life of argatroban  even with liver dysfunction approximately 190 minutes so would anticipate this to be out of patient's system given it has  been off since 6/13. T-bili during last admission around 3 with no baseline aPTT reading. INR upon admission 11/2023 was 3.5 INR this admission was 2.9 but noted on Eliquis  PTA.   Goal of Therapy:  aPTT 50-90 seconds Monitor platelets by anticoagulation protocol: Yes   Plan:  Discussed with provider, continue to hold argatroban , no clear safe way to anticoagulate without baseline aPTT and patient has not had consistent readings indicating what his baseline is.  Will continue q12h checks.   Mamie Searles, PharmD, BCCCP  Please refer to [facility] for [facility] Pharmacy numbers 01/09/2024 9:22 AM
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
## 2024-01-09 NOTE — Progress Notes (Signed)
 NAME:  Douglass Dunshee, MRN:  [id number], DOB:  July 15, 1986, LOS: 6 ADMISSION DATE:  01/03/2024, CONSULTATION DATE:  01/03/24 REFERRING MD:  Dr. Val Garin, CHIEF COMPLAINT:  SOB/ chills   History of Present Illness:   79 yoM w/PMH as below significant for biventricular HF (EF < 20%), chronic afib on Eliquis , chronic hypotension on midodrine  30mg  TID, ESRD, chronic hypoxic and hypercarbic respiratory failure on NIV, morbid obesity, medical non-compliance, and chronic pain presenting from home after being discharged from [facility] 6/9 with SOB, chills, subjective fever, ongoing poor PO, vomiting today strings of blood but then reported it was with coughing episodes, pain all over, and increased weeping from left thigh blister.  Had BM, unclear on consistency/ color.  Reports last dialysis yesterday.  Initially reports not taking any PO meds today but then said he did this morning.  Non- ambulatory at baseline.  Frequent admits, most recently 5/13-5/22 for recurrent septic shock and MRSE bacteremia due to suspected line complicated by acute on chronic HF and respiratory failure, suspected IE but not a candidate for TEE, discharged on vancomycin  with HD scheduled thru 6/30.  TDC was replaced 5/22 after 2 day line holiday.  Also started on micafungin  w/HD at [facility] after [facility] grew candida species, scheduled thru 6/30.   In ER, temp 99.1, on HFNC at 6L, with SBP into 70's requiring NE support after 500 ml bolus.  Labs significant for Na 129, K 6.3, Cl 89, alk phos 346, AST 84, t.bili 12 (recent 3.4 on 5/14), lactic 3.8, WBC 20.6 (previously 8.1 on 5/22), INR 2.9, CXR showing vascular congestion and increasing congestion and pleural effusions.  BC sent and empirically started on linezolid , flagyl , and cefepime .  Pending CT chest/abd/ pelvis.  Nephrology consulted.  PCCM called for admit.   Pertinent  Medical History   Past Medical History:  Diagnosis Date   Acute on chronic respiratory failure with hypoxia ([name])  04/21/2021   Acute on chronic systolic (congestive) heart failure ([name]) 02/26/2020   Amiodarone  toxicity    Anemia    Biventricular congestive heart failure ([name])    Candida parapsilosis infection 12/26/2023   Chronic hypoxemic respiratory failure ([name])    Class 3 severe obesity due to excess calories with serious comorbidity and body mass index (BMI) of 50.0 to 59.9 in adult 02/26/2020   ESRD on hemodialysis [facility])    Essential hypertension 02/26/2020   GERD without esophagitis 02/26/2020   Hidradenitis suppurativa 02/26/2020   NICM (nonischemic cardiomyopathy) ([name])    Obesity hypoventilation syndrome ([name])    OSA (obstructive sleep apnea)    Pneumonia    Prediabetes 02/26/2020   Significant Hospital Events: Including procedures, antibiotic start and stop dates in addition to other pertinent events   6/10 Admit 6/12 Remains on vasopressors.  Spoke to heart failure.  Not a candidate for advanced therapies.  Not a candidate for tailored hemodynamics.  Recommend a strategy of vasopressor support at fluid removal in an attempt to transition back to IHD. 6/15 patient brought up discussion of limiting some of his care; Palliative re-consulted.   Interim History / Subjective:   Complaining of pain in his abdomen and all over. He has refused his bowel regimen this weekend.  Remains on NE; dose increased overnight. Remains on CRRT. He was asking for Palliative care referral overnight.  Objective    Blood pressure 101/62, pulse (!) 108, temperature 98.1 F (36.7 C), temperature source Axillary, resp. rate (!) 36, height 6' (1.829 m), weight (!) 163 kg, SpO2  95%.        Intake/Output Summary (Last 24 hours) at 01/09/2024 0829 Last data filed at 01/09/2024 0800 Gross per 24 hour  Intake 2137.38 ml  Output 3787 ml  Net -1649.62 ml   Filed Weights   01/07/24 0500 01/08/24 0500 01/09/24 0305  Weight: (!) 170.9 kg (!) 164.7 kg (!) 163 kg   Examination: General: chronically ill appearing  man lying in bed in NAD, on CRRT HEENT: [place]/AT, icteric sclera Neuro: Awake, alert, moving extremities. Confused, answering some questions but not all answers make sense. CV: S1S2, tachycardic, reg rhythm PULM: breathing comfortably on [place], CTAB GI: abdomen obese, indurated in lower abdomen, on small area of skin breakdown in left lower quadrant under pannus-- some mild erythema near that. No drainage. Extremities: +edema, no cyanosis  I/O -2L, net - 13L for the admission  Na+ 132 BUN 16 Cr 1.65 WBC 21.3 H/H 9.8/30.2 Platelets 281 Vanc trough 26   Resolved problem list:    Assessment and Plan   Shock ([name])> suspect primarily cardiogenic; sepsis also likely with leukocytosis, but no source has been identified. RUQ US  was reassuring. -NE to maintain MAP >65 -CT abd/pelvis today to evaluate abdominal pain -Con't antibiotics -not a candidate for advanced heart failure therapies; optimize getting fluid off with CRRT using pressors  Hyperbilirubinemia-- congestive vs low forward flow. Not likely obstructed. Acaculous cholecystitis is possible -abdominal CT will give more information if this is anatomical  Acute on chronic biventricular congestive heart failure ([name])- sysolic & diastolic; known NICM. Echo shows severe RV and LV failure with EF <20%. Non-compliant for many years. Per HF service, not a candidate for advance therapies. Options for GDMT limited due to hypotension. Cannot take SGLT-2 due to skin infection risk.  Goal is con't CRRT to pull fluid, get him back on his [place] curve, but so far he has not significantly weaned off inotropes.  I agree that if he is unable to wean off pressors, hospice care will be only reasonable option.  Palliative care reconsulted at the patient's request. I think he is at very high risk of further decompensation this admission  Abdominal pain; worry this is related to his constipation -encouraged him to take his  bowel regimen and asked his  mother to help encourage this as well -CT abd pelvis -oxycodone , dilaudid  PRN  ESRD on hemodialysis - Remains on CRRT.  Has pattern of developing fluid overload and hypotension due to inability to tolerate iHD with inadequate fluid removal and noncompliance with diet and fluid restriction. Con't CRRT per nephrology; appreciate their management. Not sure he will be able to get back to iHD. Phos binders  Obesity hypoventilation syndrome - Intermittently compliant with home BIPAP. Bipap QHS  Permanent atrial fibrillation  -Not able to tolerate amiodarone  -AC is a challenge right now with renal failure and liver failure, on CRRT. No ideal option with his history of HIT. All options will have limitations with their monitoring to ensure they are therapeutic, so we will run the risk of being under or over anticoagulated. -digoxin  q48h; restarted this weekend. Checking level in 2 days.   Chronic pain disorder Con't PTA oxycodone  & lyrica  Dilaudid  PRN  Acute metabolic encephalopathy; suspect pain meds may be contributing to this some, but are unlikely fully explaining it.  -check ammonia -pain control -optimize perfusion  Skin ulceration  Chronic skin ulceration. Patient refusing dressing changes> done overnight despite his resistance. He understands he will not be able to make progress if we  cannot keep his wounds clean. On examination: multiple areas of covered wound but no drainage or discoloration.  Complete vancomycin  course from prior admission.    Mr Jayne has asked to speak to palliative and has refused some of his care. He mentioned overnight that there were part of his care he wanted to forego. Today he is not clear enough to be able to tell me what these things are, but we discussed that without doing the prescribed therapies, his recovery is likely to be limited. I discussed this with his mother as well. She thinks she can convince him to take his bowel regimen and allow us  to  continue his aggressive care measures. If comfort is his biggest priority, I do not think this is unreasonable, but I think we have a lot of room to improve this aspect of his care as well, but we are limited by his aggressive care goals previously. His mother has been asked to come to the hospital to help clarify this.  Regardless, he unfortunately has few options and has multiple end-stage diseases.    Best Practice (right click and Reselect all SmartList Selections daily)   Diet/type: Regular consistency (see orders) DVT prophylaxis SCD Pressure ulcer(s): present on admission  GI prophylaxis: PPI Lines: Dialysis Catheter-pta R TDC (12/15/23) Foley:  N/A Code Status:  full code Last date of multidisciplinary goals of care discussion - Will consult PMT again as patient has expressed that there are certain interventions that he wishes to forego.    This patient is critically ill with multiple organ system failure which requires frequent high complexity decision making, assessment, support, evaluation, and titration of therapies. This was completed through the application of advanced monitoring technologies and extensive interpretation of multiple databases. During this encounter critical care time was devoted to patient care services described in this note for 55 minutes.  Joesph Mussel, DO 01/09/24 9:45 AM [facility]  For contact information, see Amion. If no response to pager, please call PCCM consult pager. After hours, 7PM- 7AM, please call Elink.

## 2024-01-09 NOTE — TOC Progression Note (Signed)
 Transition of Care [facility]) - Progression Note    Patient Details  Name: Phillip Young MRN: [id number] Date of Birth: 02-19-1986  Transition of [facility]) CM/SW Contact  Juliane Och, LCSW Phone Number: 01/09/2024, 11:34 AM  Clinical Narrative:     11:34 AM Per progressions, Palliative Care team is to conduct GOC meeting today with patient and patient's mother, Phillip Young. Per chart review, physical therapy recommended patient discharge to [facility].  Expected Discharge Plan: Long Term Acute Care (LTAC) Barriers to Discharge: Continued Medical Work up  Expected Discharge Plan and Services       Living arrangements for the past 2 months: Apartment                             HH Agency[facility]         Social Determinants of Health (SDOH) Interventions SDOH Screenings   Food Insecurity: No Food Insecurity (01/08/2024)  Recent Concern: Food Insecurity - Food Insecurity Present (11/01/2023)  Housing: High Risk (01/08/2024)  Transportation Needs: Unmet Transportation Needs (01/08/2024)  Utilities: At Risk (01/08/2024)  Alcohol  Screen: Low Risk  (10/06/2023)  Depression (PHQ2-9): High Risk (10/06/2023)  Financial Resource Strain: Low Risk  (10/06/2023)  Physical Activity: Insufficiently Active (10/06/2023)  Social Connections: Socially Isolated (10/06/2023)  Stress: No Stress Concern Present (10/06/2023)  Tobacco Use: Medium Risk (12/14/2023)  Health Literacy: Adequate Health Literacy (10/06/2023)    Readmission Risk Interventions    12/15/2023    2:07 PM 10/13/2023   11:27 AM 09/23/2023    5:05 PM  Readmission Risk Prevention Plan  Transportation Screening Complete Complete Complete  Medication Review (RN Care Manager) Referral to Pharmacy Referral to Pharmacy Referral to Pharmacy  PCP or Specialist appointment within 3-5 days of discharge Complete  Complete  HRI or Home Care Consult Complete Complete Complete  SW Recovery Care/Counseling Consult Complete  Complete Complete  Palliative Care Screening Not Applicable Not Applicable Not Applicable  Skilled Nursing Facility Not Applicable Not Applicable Not Applicable

## 2024-01-09 NOTE — Evaluation (Signed)
 Occupational Therapy Evaluation Patient Details Name: Phillip Young MRN: [id number] DOB: 28-Nov-1985 Today's Date: 01/09/2024   History of Present Illness   Pt is 38 year old presented to [facility] on  01/03/24 for SOB, chills, and incr weeping from lt thigh blister. Pt was dc'd from [facility] on same date. Pt with shock apparently predominately from decompensated HF. Pt started on CRRT PMH - ESRD on HD, chronic resp failure, morbid obesity, NICM, OSA, chronic pain, chronic hypotension.     Clinical Impressions Pt reports at LTAC he was being lifted OOB to manual w/c and also sat up EOB, performed self-feeding and grooming tasks independently but had assist for other ADLs. Pt currently needing up to total A for ADLs. Pt reports significant back/neck pain, however declines bed mobility/OOB mobility/or repositioning at this time. Pt presenting with impairments listed below, will follow acutely. Recommend OT at [facility] upon d/c.      If plan is discharge home, recommend the following:   Two people to help with walking and/or transfers;A lot of help with bathing/dressing/bathroom;Assistance with cooking/housework;Direct supervision/assist for financial management;Direct supervision/assist for medications management;Assist for transportation;Help with stairs or ramp for entrance     Functional Status Assessment   Patient has had a recent decline in their functional status and demonstrates the ability to make significant improvements in function in a reasonable and predictable amount of time.     Equipment Recommendations   Other (comment) (defer)     Recommendations for Other Services   PT consult     Precautions/Restrictions   Precautions Precautions: Fall Precaution/Restrictions Comments: CRRT Restrictions Weight Bearing Restrictions Per Provider Order: No     Mobility Bed Mobility               General bed mobility comments: pt declines    Transfers                    General transfer comment: pt declines      Balance                                           ADL either performed or assessed with clinical judgement   ADL Overall ADL's : Needs assistance/impaired Eating/Feeding: Minimal assistance;Bed level   Grooming: Minimal assistance;Bed level   Upper Body Bathing: Maximal assistance;Bed level   Lower Body Bathing: Total assistance;Bed level   Upper Body Dressing : Maximal assistance;Bed level   Lower Body Dressing: Total assistance;Bed level   Toilet Transfer: Total assistance   Toileting- Clothing Manipulation and Hygiene: Total assistance       Functional mobility during ADLs: Total assistance       Vision   Vision Assessment?: No apparent visual deficits     Perception Perception: Not tested       Praxis Praxis: Not tested       Pertinent Vitals/Pain Pain Assessment Pain Assessment: Faces Pain Score: 8  Faces Pain Scale: Hurts whole lot Pain Location: back Pain Descriptors / Indicators: Moaning, Guarding, Grimacing Pain Intervention(s): Limited activity within patient's tolerance, Monitored during session     Extremity/Trunk Assessment Upper Extremity Assessment Upper Extremity Assessment: Generalized weakness   Lower Extremity Assessment Lower Extremity Assessment: Defer to PT evaluation   Cervical / Trunk Assessment Cervical / Trunk Assessment: Other exceptions (body habitus)   Communication Communication Communication: Impaired Factors Affecting Communication: Difficulty expressing self   Cognition  Arousal: Alert Behavior During Therapy: Anxious Cognition: No apparent impairments                               Following commands: Impaired Following commands impaired: Only follows one step commands consistently     Cueing  General Comments   Cueing Techniques: Verbal cues;Tactile cues  VSS on RA   Exercises     Shoulder Instructions      Home  Living Family/patient expects to be discharged to:: Private residence Living Arrangements: Parent Available Help at Discharge: Family;Available 24 hours/day Type of Home: Apartment Home Access: Level entry     Home Layout: One level     Bathroom Shower/Tub: Chief Strategy Officer: Handicapped height Bathroom Accessibility: Yes   Home Equipment: Wheelchair - Forensic psychologist (2 wheels);BSC/3in1;Hospital bed;Shower seat;Other (comment) (hoyer lift)          Prior Functioning/Environment               Mobility Comments: reports at facility he was getting lifted from bed to manual w/c and could propel self ADLs Comments: performs self feeding and grooming tasks without assist, assist for all other ADLs    OT Problem List: Decreased strength;Decreased range of motion;Decreased activity tolerance;Impaired balance (sitting and/or standing);Decreased cognition;Decreased safety awareness   OT Treatment/Interventions: Self-care/ADL training;Therapeutic exercise;Energy conservation;DME and/or AE instruction;Therapeutic activities;Patient/family education;Balance training      OT Goals(Current goals can be found in the care plan section)   Acute Rehab OT Goals Patient Stated Goal: to talk with hospice OT Goal Formulation: With patient Time For Goal Achievement: 01/23/24 Potential to Achieve Goals: Fair   OT Frequency:  Min 1X/week    Co-evaluation              AM-PAC OT 6 Clicks Daily Activity     Outcome Measure Help from another person eating meals?: A Little Help from another person taking care of personal grooming?: A Little Help from another person toileting, which includes using toliet, bedpan, or urinal?: Total Help from another person bathing (including washing, rinsing, drying)?: A Lot Help from another person to put on and taking off regular upper body clothing?: A Lot Help from another person to put on and taking off regular lower body  clothing?: Total 6 Click Score: 12   End of Session Equipment Utilized During Treatment: Oxygen  Nurse Communication: Mobility status  Activity Tolerance: Patient limited by pain Patient left: in bed;with call bell/phone within reach  OT Visit Diagnosis: Unsteadiness on feet (R26.81);Other abnormalities of gait and mobility (R26.89);Muscle weakness (generalized) (M62.81)                Time: 1135-1150 OT Time Calculation (min): 15 min Charges:  OT General Charges $OT Visit: 1 Visit OT Evaluation $OT Eval Low Complexity: 1 Low  Benedict Brain, OTD, OTR/L SecureChat Preferred Acute Rehab [phone]   Benedict Brain Koonce 01/09/2024, 12:32 PM

## 2024-01-09 NOTE — Progress Notes (Signed)
 [facility] NEPHROLOGY PROGRESS NOTE  Assessment/ Plan: Pt is a 38 y.o. yo male with past medical history of biventricular heart failure EF less than 20, chronic A-fib, ESRD on HD, chronic hypotension on high-dose midodrine , nonadherence with outpatient HD and treatment, recently at [facility] presented with shock and volume overload.  # Shock, cardiogenic:  12/06/23 EF < 20%. Sepsis less likely with neg cultures, abx off. Managing UF with HD.  Currently on Levophed  with dose titration.   # ESRD:  on HD since 01/2021.  Was [facility] MWF, noncompliant with outpatient HD with chronic hyperkalemia.  Currently on CRRT, UF as tolerated - continue  50- 100 cc an hour net negative.  Does not appear hemodynamics will allow iHD transition at this point.  ? If we are coming to an impasse.   # Anemia of ESRD: Dosed Aranesp .  Transfuse as needed.  # Secondary hyperparathyroidism/hyperphosphatemia: Currently on CRRT.  Follow labs.  # Chronic hypotension/volume: On Levophed  and midodrine  for shock and receiving midodrine  to facilitate UF.  # a fib: usually rate controlled, this AM low 100s. Decrease UF rate today.   #HIT: avoiding heparin   GOC:  pt with poor prognosis and at times even refusing basic care, not abiding by fluid restriction.  He requested a pall care c/s- I think this is reasonable   Subjective: Seen and examined.  Mom at bedside.  Tolerating CRRT.  Objective Vital signs in last 24 hours: Vitals:   01/09/24 1030 01/09/24 1100 01/09/24 1245 01/09/24 1315  BP: (!) 88/60     Pulse: (!) 136 (!) 109 (!) 101 (!) 1  Resp: 19 (!) 29 17   Temp:   97.8 F (36.6 C)   TempSrc:   Axillary   SpO2: (!) 85% (!) 88% (!) 84%   Weight:      Height:       Weight change: -1.7 kg  Intake/Output Summary (Last 24 hours) at 01/09/2024 1329 Last data filed at 01/09/2024 1300 Gross per 24 hour  Intake 1231.31 ml  Output 2793 ml  Net -1561.69 ml       Labs: RENAL PANEL Recent Labs  Lab  01/05/24 0347 01/05/24 0348 01/06/24 0442 01/06/24 0443 01/07/24 0252 01/07/24 1707 01/08/24 0451 01/08/24 0453 01/08/24 1624 01/09/24 0236  NA 130*   < > 132*   < > 133* 133* 131* 132* 131* 132*  K 4.7   < > 4.2   < > 3.9 4.1 3.8 3.8 3.9 4.0  CL 98   < > 99   < > 99 97* 99 99 96* 99  CO2 23   < > 23   < > 22 24 23  21* 24 22  GLUCOSE 100*   < > 88   < > 92 97 116* 115* 104* 101*  BUN 28*   < > 21*   < > 19 19 17 17 16 16   CREATININE 3.00*   < > 2.19*   < > 2.02* 1.92* 1.70* 1.75* 1.68* 1.65*  CALCIUM  8.8*   < > 8.9   < > 9.1 9.0 8.8* 8.8* 9.1 9.2  MG 2.4  --  2.4  --  2.3  --  2.3  --   --  2.3  PHOS  --    < >  --    < > 2.6 2.6  --  2.5 2.4* 2.5  ALBUMIN  2.1*   < > 1.9*   < > 1.8* 1.8* 1.7* 1.7* 1.7* 1.7*   < > =  values in this interval not displayed.    Liver Function Tests: Recent Labs  Lab 01/06/24 0442 01/06/24 0443 01/07/24 0252 01/07/24 1707 01/08/24 0451 01/08/24 0453 01/08/24 1624 01/09/24 0236  AST 83*  --  89*  --  87*  --   --   --   ALT 36  --  36  --  39  --   --   --   ALKPHOS 340*  --  290*  --  342*  --   --   --   BILITOT 18.6*  --  19.7*  --  20.7*  --   --   --   PROT 6.4*  --  6.5  --  6.5  --   --   --   ALBUMIN  1.9*   < > 1.8*   < > 1.7* 1.7* 1.7* 1.7*   < > = values in this interval not displayed.   No results for input(s): LIPASE, AMYLASE in the last 168 hours. Recent Labs  Lab 01/09/24 1059  AMMONIA 44*   CBC: Recent Labs    02/21/23 1538 02/22/23 0209 04/14/23 0314 04/15/23 1054 05/19/23 2020 05/20/23 1422 08/31/23 0348 09/01/23 0343 01/05/24 0347 01/06/24 0442 01/07/24 0246 01/08/24 0451 01/09/24 0236  HGB  --    < >  --    < >  --    < > 10.3*   < > 9.2* 9.3* 9.3* 9.3* 9.8*  MCV  --    < >  --    < >  --    < > 100.6*   < > 90.3 91.1 90.7 91.7 91.2  VITAMINB12  --   --  1,014*  --   --   --  815  --   --   --   --   --   --   FOLATE 12.0  --   --   --   --   --  7.9  --   --   --   --   --   --   FERRITIN  --   --    --   --  359*  --  241  --   --   --   --   --   --   TIBC  --   --   --   --  211*  --  364  --   --   --   --   --   --   IRON  --   --   --   --  64  --  58  --   --   --   --   --   --    < > = values in this interval not displayed.    Cardiac Enzymes: No results for input(s): CKTOTAL, CKMB, CKMBINDEX, TROPONINI in the last 168 hours. CBG: Recent Labs  Lab 01/03/24 1740 01/06/24 2325  GLUCAP 128* 88    Iron Studies: No results for input(s): IRON, TIBC, TRANSFERRIN, FERRITIN in the last 72 hours. Studies/Results: No results found.   Medications: Infusions:  anticoagulant sodium citrate      norepinephrine  (LEVOPHED ) Adult infusion 10 mcg/min (01/09/24 1200)   PrismaSol  BGK 2/3.5 400 mL/hr at 01/09/24 0642   PrismaSol  BGK 2/3.5 400 mL/hr at 01/09/24 0822   prismasol  BGK 4/2.5 2,000 mL/hr at 01/09/24 1139   vancomycin       Scheduled Medications:  Chlorhexidine  Gluconate Cloth  6 each Topical Daily  cinacalcet   30 mg Oral Q M,W,F   Darbepoetin Alfa   150 mcg Intravenous Q7 days   digoxin   0.125 mg Oral Q48H   feeding supplement (NEPRO CARB STEADY)  237 mL Oral BID BM   leptospermum manuka honey  1 Application Topical Daily   levothyroxine   25 mcg Oral Q0600   liver oil-zinc  oxide   Topical BID   midodrine   30 mg Oral Q8H   multivitamin  1 tablet Oral QHS   mouth rinse  15 mL Mouth Rinse 4 times per day   polyethylene glycol  17 g Oral Daily   pregabalin   50 mg Oral Daily   senna  1 tablet Oral Daily   venlafaxine  XR  37.5 mg Oral Daily   zinc  sulfate (50mg  elemental zinc )  220 mg Oral Daily    have reviewed scheduled and prn medications.  Physical Exam: General:NAD, lying flat, sleepy but arousable Heart:RRR, s1s2 nl Lungs: Bilateral distant breath sound Abdomen:soft, mildly distended Extremities: 1-2+ Peripheral edema Dialysis Access: TDC  Cyd Hostler 01/09/2024,1:29 PM  LOS: 6 days

## 2024-01-09 NOTE — Progress Notes (Signed)
 Pt receives out-pt HD at [facility] on [address] on MWF. Will assist as needed.   Lauraine Polite Renal Navigator [phone]

## 2024-01-09 NOTE — Progress Notes (Signed)
 Ct abd pelvis reviewed> no obvious source of uncontrolled pain or uncontrolled infection.   Still think using his bowel regimen would be beneficial to treating his discomfort.  Joesph Mussel, DO 01/09/24 4:20 PM [facility]  For contact information, see Amion. If no response to pager, please call PCCM consult pager. After hours, 7PM- 7AM, please call Elink.

## 2024-01-10 ENCOUNTER — Inpatient Hospital Stay (HOSPITAL_COMMUNITY)

## 2024-01-10 ENCOUNTER — Telehealth: Payer: Self-pay | Admitting: *Deleted

## 2024-01-10 ENCOUNTER — Ambulatory Visit: Admitting: Speech Pathology

## 2024-01-10 DIAGNOSIS — E7220 Disorder of urea cycle metabolism, unspecified: Secondary | ICD-10-CM

## 2024-01-10 DIAGNOSIS — I4821 Permanent atrial fibrillation: Secondary | ICD-10-CM

## 2024-01-10 DIAGNOSIS — G9340 Encephalopathy, unspecified: Secondary | ICD-10-CM

## 2024-01-10 DIAGNOSIS — R579 Shock, unspecified: Secondary | ICD-10-CM | POA: Diagnosis not present

## 2024-01-10 DIAGNOSIS — G9341 Metabolic encephalopathy: Secondary | ICD-10-CM

## 2024-01-10 DIAGNOSIS — I5043 Acute on chronic combined systolic (congestive) and diastolic (congestive) heart failure: Secondary | ICD-10-CM | POA: Diagnosis not present

## 2024-01-10 DIAGNOSIS — R1084 Generalized abdominal pain: Secondary | ICD-10-CM | POA: Diagnosis not present

## 2024-01-10 LAB — RENAL FUNCTION PANEL
Albumin: 1.6 g/dL — ABNORMAL LOW (ref 3.5–5.0)
Albumin: <1.5 g/dL — ABNORMAL LOW (ref 3.5–5.0)
Anion gap: 8 (ref 5–15)
Anion gap: 12 (ref 5–15)
BUN: 17 mg/dL (ref 6–20)
BUN: 16 mg/dL (ref 6–20)
CO2: 23 mmol/L (ref 22–32)
CO2: 22 mmol/L (ref 22–32)
Calcium: 8.6 mg/dL — ABNORMAL LOW (ref 8.9–10.3)
Calcium: 8.6 mg/dL — ABNORMAL LOW (ref 8.9–10.3)
Chloride: 100 mmol/L (ref 98–111)
Chloride: 96 mmol/L — ABNORMAL LOW (ref 98–111)
Creatinine, Ser: 1.42 mg/dL — ABNORMAL HIGH (ref 0.61–1.24)
Creatinine, Ser: 1.57 mg/dL — ABNORMAL HIGH (ref 0.61–1.24)
GFR, Estimated: >60 mL/min (ref >60)
GFR, Estimated: 57 mL/min — ABNORMAL LOW (ref >60)
Glucose, Bld: 88 mg/dL (ref 70–99)
Glucose, Bld: 123 mg/dL — ABNORMAL HIGH (ref 70–99)
Phosphorus: 2.7 mg/dL (ref 2.5–4.6)
Phosphorus: 2.5 mg/dL (ref 2.5–4.6)
Potassium: 4 mmol/L (ref 3.5–5.1)
Potassium: 4.1 mmol/L (ref 3.5–5.1)
Sodium: 131 mmol/L — ABNORMAL LOW (ref 135–145)
Sodium: 130 mmol/L — ABNORMAL LOW (ref 135–145)

## 2024-01-10 LAB — HEPATIC FUNCTION PANEL
ALT: 38 U/L (ref 0–44)
AST: 65 U/L — ABNORMAL HIGH (ref 15–41)
Albumin: 1.6 g/dL — ABNORMAL LOW (ref 3.5–5.0)
Alkaline Phosphatase: 296 U/L — ABNORMAL HIGH (ref 38–126)
Bilirubin, Direct: 14.2 mg/dL — ABNORMAL HIGH (ref 0.0–0.2)
Indirect Bilirubin: 8.4 mg/dL — ABNORMAL HIGH (ref 0.3–0.9)
Total Bilirubin: 22.6 mg/dL (ref 0.0–1.2)
Total Protein: 6.3 g/dL — ABNORMAL LOW (ref 6.5–8.1)

## 2024-01-10 LAB — APTT
aPTT: 56 s — ABNORMAL HIGH (ref 24–36)
aPTT: 75 s — ABNORMAL HIGH (ref 24–36)
aPTT: 71 s — ABNORMAL HIGH (ref 24–36)

## 2024-01-10 LAB — MAGNESIUM: Magnesium: 2.1 mg/dL (ref 1.7–2.4)

## 2024-01-10 MED ORDER — SODIUM CHLORIDE 0.9 % IV SOLN
0.0600 mg/kg/h | INTRAVENOUS | Status: DC
  Administered 2024-01-10: 0.05 mg/kg/h via INTRAVENOUS
  Administered 2024-01-11 – 2024-01-12 (×2): 0.09 mg/kg/h via INTRAVENOUS
  Administered 2024-01-14: 0.075 mg/kg/h via INTRAVENOUS
  Filled 2024-01-10 (×5): qty 250

## 2024-01-10 MED ORDER — MIDODRINE HCL 5 MG PO TABS
40.0000 mg | ORAL_TABLET | Freq: Three times a day (TID) | ORAL | Status: DC
  Administered 2024-01-11 – 2024-01-16 (×18): 40 mg via ORAL
  Filled 2024-01-10 (×19): qty 8

## 2024-01-10 MED ORDER — LACTULOSE 10 GM/15ML PO SOLN
30.0000 g | Freq: Two times a day (BID) | ORAL | Status: DC
  Administered 2024-01-11 – 2024-01-16 (×4): 30 g via ORAL
  Filled 2024-01-10 (×9): qty 45

## 2024-01-10 NOTE — Progress Notes (Signed)
 Daily Progress Note   Patient Name: Phillip Young       Date: 01/10/2024 DOB: 07/08/1986  Age: 38 y.o. MRN#: [id number] Attending Physician: Joesph Mussel, DO Primary Care Physician: Carleen Chary, DO Admit Date: 01/03/2024  Reason for Consultation/Follow-up: Establishing goals of care  Subjective: Medical records reviewed including progress notes, labs, imaging. Patient assessed at the bedside.  He reports pain is improved today to 1 out of 10.  Discussed with RN.  No family present during my visit.  Created space and opportunity for patient's thoughts and feelings on his current illness.  Patient shares that after he continued the conversation yesterday evening with his mother, he wishes to continue with aggressive care.  She has told him that he is her best friend and that she will have no one left if he takes a mercy killing.  He remains concerned that comfort care would be suicide, despite several conversations and education on his organ failure as the cause of his death should life-prolonging interventions be removed.    We reviewed the severity of his illness in detail, explaining life-support measures currently in place, end-stage heart failure with EF of less than 20%, and exploring his preferences about any further escalation of care.  CODE STATUS was discussed.  Patient shares that his boundaries include drawing a line at no aggressive care for a heart attack or stroke. He would also not want to continue care indefinitely if it appears he is unable to leave the hospital. He understands that he will eventually die from his recurrent complications and chronic illnesses, but similarly to our discussion last December, he strongly values his mother's input on this. Emotional support and therapeutic listening was provided.  Questions  and concerns addressed. PMT will continue to support holistically.   Length of Stay: 7   Physical Exam Vitals and nursing note reviewed.  Constitutional:      General: He is not in acute distress.    Appearance: He is ill-appearing.   Cardiovascular:     Comments: AFIB Pulmonary:     Effort: Tachypnea present.   Skin:    General: Skin is warm and dry.   Neurological:     Mental Status: He is alert and oriented to person, place, and time.   Psychiatric:        Mood and Affect: Mood normal.        Behavior: Behavior normal.            Vital Signs: BP 106/66   Pulse (!) 105   Temp 98.6 F (37 C) (Axillary)   Resp (!) 23   Ht 6' (1.829 m)   Wt (!) 163 kg   SpO2 92%   BMI 48.74 kg/m  SpO2: SpO2: 92 % O2 Device: O2 Device: Nasal Cannula O2 Flow Rate: O2 Flow Rate (L/min): 5 L/min      Palliative Assessment/Data: 30%   Palliative Care Assessment & Plan   Patient Profile: 38 y.o. male  with past medical history of biventricular HF (EF < 20%), chronic afib on Eliquis , chronic hypotension on midodrine  30mg  TID, ESRD, chronic hypoxic and hypercarbic respiratory failure on NIV, morbid obesity, medical non-compliance  admitted on 01/03/2024 with  pain, left wound weeping.    Patient has had 7 hospitalizations in the past 6 months. Most recent admit 5/13-5/22 for recurrent septic shock and MRSE bacteremia due to suspected line complicated by acute on chronic HF and respiratory failure, suspected IE but not a candidate for TEE, discharged on vancomycin  with HD scheduled thru 6/30.     He is currently admitted for shock (cardiogenic, septic shock likely as well) with acute on chronic CHF and requiring pressors. He has multiple wounds and ulcers but refusing dressing changes. Also with constipation and opting against bowel regimen.    PMT has been consulted to assist with goals of care conversation at patient's request.  Assessment: Goals of care conversation End-stage renal  disease on dialysis Cardiogenic shock Acute on chronic CHF biventricular Chronic pain  Recommendations/Plan: Continue full code/full scope treatment Despite voicing his preference for comfort focused care and readiness for end-of-life on his worst days, patient historically chooses to continue aggressive measures when feeling better and at the encouragement of his mother Spoke with CRNA her discussed with anesthesiologist regarding potential nerve block for his wound pain; anesthesia states they are unable to manage acute pain in this setting Ongoing goals of care discussions PMT will continue to follow and support   Prognosis: Poor  Discharge Planning: To Be Determined    Total time: I spent 70 minutes in the care of the patient today in the above activities and documenting the encounter.  MDM high         Darly Massi Alroy Jericho, PA-C  Palliative Medicine Team Team phone # [phone]  Thank you for allowing the Palliative Medicine Team to assist in the care of this patient. Please utilize secure chat with additional questions, if there is no response within 30 minutes please call the above phone number.  Palliative Medicine Team providers are available by phone from 7am to 7pm daily and can be reached through the team cell phone.  Should this patient require assistance outside of these hours, please call the patient's attending physician.

## 2024-01-10 NOTE — Progress Notes (Signed)
 Received consult on behalf of pt. Pt has working PIV in L upper arm but was infusing with vasopressors. Upon review of MAR, pt has had levophed  infusing peripherally since Saturday. Educated primary RN that central line would be needed at this time due to prolonged vasopressor infusion. Primary RN stated that pt is considering comfort care/palliative care and therefore providers have not ordered a central line yet. Placed 20g in R forearm without incident. Spoke with primary RN and stated that pt will need a central line today (6/17) or tomorrow (6/18) if pt remains on vasopressors. Primary RN agreed and reached out to primary team for further discussion of the plan of care.

## 2024-01-10 NOTE — Telephone Encounter (Signed)
 Copied from CRM [id number]. Topic: Clinical - Order For Equipment >> Jan 09, 2024  4:03 PM Lenon Radar A wrote: Reason for CRM: Patients mother called in requesting DME for wheelchair. Please fax request to [phone] patient has appointment next Tuesday for wheelchair assessment. Any questions please contact mother at [phone].

## 2024-01-10 NOTE — Progress Notes (Signed)
 NAME:  Phillip Young, MRN:  [id number], DOB:  1985/10/01, LOS: 7 ADMISSION DATE:  01/03/2024, CONSULTATION DATE:  01/03/24 REFERRING MD:  Dr. Val Garin, CHIEF COMPLAINT:  SOB/ chills   History of Present Illness:   64 yoM w/PMH as below significant for biventricular HF (EF < 20%), chronic afib on Eliquis , chronic hypotension on midodrine  30mg  TID, ESRD, chronic hypoxic and hypercarbic respiratory failure on NIV, morbid obesity, medical non-compliance, and chronic pain presenting from home after being discharged from [facility] 6/9 with SOB, chills, subjective fever, ongoing poor PO, vomiting today strings of blood but then reported it was with coughing episodes, pain all over, and increased weeping from left thigh blister.  Had BM, unclear on consistency/ color.  Reports last dialysis yesterday.  Initially reports not taking any PO meds today but then said he did this morning.  Non- ambulatory at baseline.  Frequent admits, most recently 5/13-5/22 for recurrent septic shock and MRSE bacteremia due to suspected line complicated by acute on chronic HF and respiratory failure, suspected IE but not a candidate for TEE, discharged on vancomycin  with HD scheduled thru 6/30.  TDC was replaced 5/22 after 2 day line holiday.  Also started on micafungin  w/HD at [facility] after [facility] grew candida species, scheduled thru 6/30.   In ER, temp 99.1, on HFNC at 6L, with SBP into 70's requiring NE support after 500 ml bolus.  Labs significant for Na 129, K 6.3, Cl 89, alk phos 346, AST 84, t.bili 12 (recent 3.4 on 5/14), lactic 3.8, WBC 20.6 (previously 8.1 on 5/22), INR 2.9, CXR showing vascular congestion and increasing congestion and pleural effusions.  BC sent and empirically started on linezolid , flagyl , and cefepime .  Pending CT chest/abd/ pelvis.  Nephrology consulted.  PCCM called for admit.   Pertinent  Medical History   Past Medical History:  Diagnosis Date   Acute on chronic respiratory failure with hypoxia ([name])  04/21/2021   Acute on chronic systolic (congestive) heart failure ([name]) 02/26/2020   Amiodarone  toxicity    Anemia    Biventricular congestive heart failure ([name])    Candida parapsilosis infection 12/26/2023   Chronic hypoxemic respiratory failure ([name])    Class 3 severe obesity due to excess calories with serious comorbidity and body mass index (BMI) of 50.0 to 59.9 in adult 02/26/2020   ESRD on hemodialysis [facility])    Essential hypertension 02/26/2020   GERD without esophagitis 02/26/2020   Hidradenitis suppurativa 02/26/2020   NICM (nonischemic cardiomyopathy) ([name])    Obesity hypoventilation syndrome ([name])    OSA (obstructive sleep apnea)    Pneumonia    Prediabetes 02/26/2020   Significant Hospital Events: Including procedures, antibiotic start and stop dates in addition to other pertinent events   6/10 Admit 6/12 Remains on vasopressors.  Spoke to heart failure.  Not a candidate for advanced therapies.  Not a candidate for tailored hemodynamics.  Recommend a strategy of vasopressor support at fluid removal in an attempt to transition back to IHD. 6/15 patient brought up discussion of limiting some of his care; Palliative re-consulted.  6/16 palliative consulted per patient's request. CT unrevealing for cause of abdominal pain.  Interim History / Subjective:  He has some mild ongoing pain but feels better than yesterday.  Objective    Blood pressure (!) 94/56, pulse (!) 106, temperature 98.1 F (36.7 C), temperature source Oral, resp. rate (!) 26, height 6' (1.829 m), weight (!) 163 kg, SpO2 93%.        Intake/Output Summary (Last  24 hours) at 01/10/2024 0720 Last data filed at 01/10/2024 0600 Gross per 24 hour  Intake 1931.42 ml  Output 2491 ml  Net -559.58 ml   Filed Weights   01/07/24 0500 01/08/24 0500 01/09/24 0305  Weight: (!) 170.9 kg (!) 164.7 kg (!) 163 kg   Examination: General: chronically ill appearing man lying in bed in NAD HEENT: [place]/AT, eyes  anicteric Neuro: awake, alert, moving extremities, answering questions CV: S1S2, tachycardic, irreg rhythm PULM: breathing comfortably on [place], reduced basilar breath sounds. No conversational dyspnea. Hoarse voice. GI: abd obese, soft, NT. Still indurated chronically in lower abdomen over pannus. Extremities: +edema  I/O -0.5L, net - 13.2L for the admission  Na+ 131 BUN 17 Cr 1.42 on CRRT   Resolved problem list:    Assessment and Plan   Shock ([name])> suspect primarily cardiogenic; sepsis also likely with leukocytosis, but no source has been identified. RUQ US  was reassuring. -con't NE to maintain MAP >65 -con't vanc for cellulitis -not a candidate for advanced heart failure therapies; optimize getting fluid off with CRRT using pressors  Hyperbilirubinemia-- congestive vs low forward flow. Not likely obstructed. Acaculous cholecystitis is possible -recheck LFTs today -CT on 6/16 reassurring that GB is contracted  Acute on chronic biventricular congestive heart failure ([name])- sysolic & diastolic; known NICM. Echo shows severe RV and LV failure with EF <20%. Non-compliant for many years. Per HF service, not a candidate for advance therapies. Options for GDMT limited due to hypotension. Cannot take SGLT-2 due to skin infection risk.  Goal is CRRT to optimize volume status and cardiac function, but he is not coming off pressors yet I agree that if he is unable to wean off pressors, hospice care will be only reasonable option.  Palliative care reconsulted at the patient's request. Ihe remains at high risk for decompensation this admission.   Abdominal pain; worry this is related to his constipation -encouraged bowel regimen -oxycodone  & dilaudid  PRN  ESRD on hemodialysis - Remains on CRRT.  Has pattern of developing fluid overload and hypotension due to inability to tolerate iHD with inadequate fluid removal and noncompliance with diet and fluid restriction. Con't CRRT per nephro; not  clear when he will get back to [facility] or if he will be able to Con't phos binders  Obesity hypoventilation syndrome - Intermittently compliant with home BIPAP. BiPAP at bedtime; was not weaning this morning  Permanent atrial fibrillation  -does not tolerate amiodarone  -AC is a challenge right now with renal failure and liver failure> with more stable PTTs today will start bival with pharmacy dosing; with high baseline PTT will base on PTT 1.5x his baseline -con't digoxin  q48h with periodic checking levels  Chronic pain disorder Con't PTA oxycodone  & lirica dilaudid  PRN  Acute metabolic encephalopathy; suspect pain meds may be contributing to this some, but are unlikely fully explaining it.  Encephalopathy better today.  Mild hyperammoniemia -lactulose  BID -pain control -maintain adequate MAP   Skin ulceration  Has chronic skin ulceration, and he routinely has been refusing dressing changes. He understands he will not be able to make progress if we cannot keep his wounds clean. On examination: multiple areas of covered wound but no drainage or discoloration.  Complete vancomycin  course   Follow up palliative care meeting today. Long term prognosis poor. May not have good options if he is unable to tolerated coming off CRRT back to [facility].     Best Practice (right click and Reselect all SmartList Selections daily)  Diet/type: Regular consistency (see orders) DVT prophylaxis SCD, start bival gtt Pressure ulcer(s): present on admission  GI prophylaxis: PPI Lines: Dialysis Catheter-pta R TDC (12/15/23) Foley:  N/A Code Status:  full code Last date of multidisciplinary goals of care discussion - 6/16 with PMT   This patient is critically ill with multiple organ system failure which requires frequent high complexity decision making, assessment, support, evaluation, and titration of therapies. This was completed through the application of advanced monitoring technologies and extensive  interpretation of multiple databases. During this encounter critical care time was devoted to patient care services described in this note for 36 minutes.  Joesph Mussel, DO 01/10/24 9:25 AM [facility]  For contact information, see Amion. If no response to pager, please call PCCM consult pager. After hours, 7PM- 7AM, please call Elink.

## 2024-01-10 NOTE — Progress Notes (Addendum)
 PHARMACY - ANTICOAGULATION  Pharmacy Consult for bivalirudin    Indication: atrial fibrillation w/ hx of HIT   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, SRA positive   Metoprolol  Other (See Comments)    near syncope   Other Swelling and Other (See Comments)    Steroids Fluid seeping out of legs Nebulizer(unknown name of neb) elevated pressure    Tramadol      unknown    Patient Measurements: Height: 6' (182.9 cm) Weight: (!) 163 kg (359 lb 5.6 oz) IBW/kg (Calculated) : 77.6 HEPARIN  DW (KG): 125.9  Vital Signs: Temp: 97.8 F (36.6 C) (06/17 0806) Temp Source: Oral (06/17 0806) BP: 102/61 (06/17 0800) Pulse Rate: 101 (06/17 0800)  Labs: Recent Labs    01/08/24 0451 01/08/24 0453 01/09/24 0236 01/09/24 1708 01/10/24 0549  HGB 9.3*  --  9.8*  --   --   HCT 28.7*  --  30.2*  --   --   PLT 261  --  281  --   --   APTT 80*   < > 61* 57* 56*  CREATININE 1.70*   < > 1.65* 1.60* 1.42*   < > = values in this interval not displayed.    Estimated Creatinine Clearance: 111.5 mL/min (A) (by C-G formula based on SCr of 1.42 mg/dL (H)).  Assessment: 38 y.o. male with h/o Afib, Eliquis  on hold for CRRT, for argatroban  due to h/o HIT. Remains on CRRT. HgB 9.8 and PLTs 281, aPTT downtrending but remains elevated at 61. No baseline aPTT from this admission, T-bili continues to increase to 20.7, noted yellowing of sclera on exam. Argatroban  off since 6/13 around noon. Unclear if aPTT elevated due to liver dysfunction/congestion, underlying coagulopathy, or due to argatroban . Half life of argatroban  even with liver dysfunction approximately 190 minutes so would anticipate this to be out of patient's system given it has been off since 6/13. T-bili during last  admission around 3 with no baseline aPTT reading. INR upon admission 11/2023 was 3.5 INR this admission was 2.9 but noted on Eliquis  PTA.   aPTT has been stable x 3 readings 61,57, and 56. Suspect this to be patient's baseline. Due to liver impairment will transition to bival while on CRRT. In addition to Afib, patient high risk for PE with weight, low EF, and minimal movement.   Goal of Therapy:  aPTT: 80-100, 1.5x baseline of approx 55  Monitor platelets by anticoagulation protocol: Yes   Plan:  Start bival 0.05mg /kg/hr utilizing adjusted body weight.  Adjust aPTT targets given baseline aPTT is around 55. Target aPTT per protocol is 50-85 however unreliable target with baseline elevation. Will target 1.5x baseline aPTT of 55 after discussion w/ provider.  Recheck aPTT in 4 hours.   Mamie Searles, PharmD, BCCCP  Please refer to [facility] for [facility] Pharmacy numbers 01/10/2024 9:13 AM

## 2024-01-10 NOTE — Progress Notes (Signed)
 Pt requesting to move abd US  to the early morning so he can fast overnight instead of right now. US  techs agreeable and attending physician aware.

## 2024-01-10 NOTE — Progress Notes (Signed)
   01/10/24 0002  BiPAP/CPAP/SIPAP  BiPAP/CPAP/SIPAP  (home machine)  Reason BIPAP/CPAP not in use Non-compliant  Patient Home Machine Yes   Patient declined BIPAP tonight. RN made aware and told to call if he changes his mind.

## 2024-01-10 NOTE — Progress Notes (Addendum)
 Phillip Young  Assessment/ Plan: Pt is a 38 y.o. yo male with past medical history of biventricular heart failure EF less than 20, chronic A-fib, ESRD on HD, chronic hypotension on high-dose midodrine , nonadherence with outpatient HD and treatment, recently at [facility] presented with shock and volume overload.  # Shock, cardiogenic:  12/06/23 EF < 20%. Sepsis less likely with neg cultures, abx off. Managing UF with HD.  Currently on Levophed  with dose titration.   # ESRD:  on HD since 01/2021.  Was [facility] MWF, noncompliant with outpatient HD with chronic hyperkalemia.  Currently on CRRT, UF as tolerated - continue  50- 100 cc an hour net negative.  Does not appear hemodynamics will allow iHD transition at this point.  Prognosis is very poor.  # Anemia of ESRD: Dosed Aranesp .  Transfuse as needed.  # Secondary hyperparathyroidism/hyperphosphatemia: Currently on CRRT.  Follow labs.  # Chronic hypotension/volume: On Levophed  and midodrine  for shock and receiving midodrine  to facilitate UF.  # a fib: usually rate controlled, this AM low 100s. Decrease UF rate today.   #HIT: avoiding heparin   GOC:  pt with poor prognosis and at times even refusing basic care, not abiding by fluid restriction.  He requested a pall care c/s- greatly appreciate their expertise,  Subjective: CRRT ongoing.  Pressors about the same.    Objective Vital signs in last 24 hours: Vitals:   01/10/24 1015 01/10/24 1030 01/10/24 1045 01/10/24 1128  BP: (!) 107/56 91/60 106/66   Pulse: (!) 108 (!) 106 (!) 105   Resp: (!) 27 (!) 21 (!) 23   Temp:    98.6 F (37 C)  TempSrc:    Axillary  SpO2: 93% 90% 92%   Weight:      Height:       Weight change:   Intake/Output Summary (Last 24 hours) at 01/10/2024 1331 Last data filed at 01/10/2024 1200 Gross per 24 hour  Intake 2161.24 ml  Output 3728 ml  Net -1566.76 ml       Labs: RENAL PANEL Recent Labs  Lab 01/06/24 0442  01/06/24 0443 01/07/24 0252 01/07/24 1707 01/08/24 0451 01/08/24 0453 01/08/24 1624 01/09/24 0236 01/09/24 1708 01/10/24 0549  NA 132*   < > 133*   < > 131* 132* 131* 132* 130* 131*  K 4.2   < > 3.9   < > 3.8 3.8 3.9 4.0 4.2 4.0  CL 99   < > 99   < > 99 99 96* 99 98 100  CO2 23   < > 22   < > 23 21* 24 22 22 23   GLUCOSE 88   < > 92   < > 116* 115* 104* 101* 100* 88  BUN 21*   < > 19   < > 17 17 16 16 16 17   CREATININE 2.19*   < > 2.02*   < > 1.70* 1.75* 1.68* 1.65* 1.60* 1.42*  CALCIUM  8.9   < > 9.1   < > 8.8* 8.8* 9.1 9.2 8.7* 8.6*  MG 2.4  --  2.3  --  2.3  --   --  2.3  --  2.1  PHOS  --    < > 2.6   < >  --  2.5 2.4* 2.5 2.3* 2.7  ALBUMIN  1.9*   < > 1.8*   < > 1.7* 1.7* 1.7* 1.7* 1.7* 1.6*   < > = values in this interval not displayed.  Liver Function Tests: Recent Labs  Lab 01/06/24 0442 01/06/24 0443 01/07/24 0252 01/07/24 1707 01/08/24 0451 01/08/24 0453 01/09/24 0236 01/09/24 1708 01/10/24 0549  AST 83*  --  89*  --  87*  --   --   --   --   ALT 36  --  36  --  39  --   --   --   --   ALKPHOS 340*  --  290*  --  342*  --   --   --   --   BILITOT 18.6*  --  19.7*  --  20.7*  --   --   --   --   PROT 6.4*  --  6.5  --  6.5  --   --   --   --   ALBUMIN  1.9*   < > 1.8*   < > 1.7*   < > 1.7* 1.7* 1.6*   < > = values in this interval not displayed.   No results for input(s): LIPASE, AMYLASE in the last 168 hours. Recent Labs  Lab 01/09/24 1059  AMMONIA 44*   CBC: Recent Labs    02/21/23 1538 02/22/23 0209 04/14/23 0314 04/15/23 1054 05/19/23 2020 05/20/23 1422 08/31/23 0348 09/01/23 0343 01/05/24 0347 01/06/24 0442 01/07/24 0246 01/08/24 0451 01/09/24 0236  HGB  --    < >  --    < >  --    < > 10.3*   < > 9.2* 9.3* 9.3* 9.3* 9.8*  MCV  --    < >  --    < >  --    < > 100.6*   < > 90.3 91.1 90.7 91.7 91.2  VITAMINB12  --   --  1,014*  --   --   --  815  --   --   --   --   --   --   FOLATE 12.0  --   --   --   --   --  7.9  --   --   --   --    --   --   FERRITIN  --   --   --   --  359*  --  241  --   --   --   --   --   --   TIBC  --   --   --   --  211*  --  364  --   --   --   --   --   --   IRON  --   --   --   --  64  --  58  --   --   --   --   --   --    < > = values in this interval not displayed.    Cardiac Enzymes: No results for input(s): CKTOTAL, CKMB, CKMBINDEX, TROPONINI in the last 168 hours. CBG: Recent Labs  Lab 01/03/24 1740 01/06/24 2325  GLUCAP 128* 88    Iron Studies: No results for input(s): IRON, TIBC, TRANSFERRIN, FERRITIN in the last 72 hours. Studies/Results: CT ABDOMEN PELVIS W CONTRAST Result Date: 01/09/2024 CLINICAL DATA:  Acute, non localized abdominal pain. Skin breakdown underneath pannus in the left lower quadrant. EXAM: CT ABDOMEN AND PELVIS WITH CONTRAST TECHNIQUE: Multidetector CT imaging of the abdomen and pelvis was performed using the standard protocol following bolus administration of intravenous contrast. RADIATION DOSE REDUCTION: This exam was performed  according to the departmental dose-optimization program which includes automated exposure control, adjustment of the mA and/or kV according to patient size and/or use of iterative reconstruction technique. CONTRAST:  75mL OMNIPAQUE  IOHEXOL  350 MG/ML SOLN COMPARISON:  01/03/2024 FINDINGS: Lower chest: Stable enlarged heart. Decreased size of the 2 cm thick chronic pericardial effusion, currently measuring 1.2 cm in thickness mild diffuse ground-glass opacity at both lung bases no significant change. No pleural fluid. Stable small right lower lobe calcified granuloma. Hepatobiliary: Unremarkable liver. The gallbladder remains contracted and poorly visualized. Pancreas: Unremarkable. No pancreatic ductal dilatation or surrounding inflammatory changes. Spleen: Normal in size without focal abnormality. Adrenals/Urinary Tract: Stable mild left adrenal hyperplasia. Normal-appearing right adrenal gland, kidneys and ureters. The urinary  bladder is empty and grossly unremarkable. Stomach/Bowel: Unremarkable stomach, small bowel and colon. The appendix is not visualized. No secondary signs of appendicitis. Vascular/Lymphatic: No significant vascular findings are present. No enlarged abdominal or pelvic lymph nodes. Reproductive: Prostate is unremarkable. Other: Bilateral cutaneous and subcutaneous edema with little change. No soft tissue gas. Stable areas of subcutaneous fat necrosis and scarring in the sacral region. Small umbilical and right inguinal hernias containing fat. Musculoskeletal: Minimal lower thoracic spine degenerative changes. No evidence of osteomyelitis. IMPRESSION: 1. No acute abnormality. 2. Stable cardiomegaly. 3. Decreased size of the chronic pericardial effusion. 4. Stable mild diffuse ground-glass opacity at both lung bases, likely due to interstitial edema 5. Stable bilateral cutaneous and subcutaneous edema. 6. Stable areas of subcutaneous fat necrosis and scarring in the sacral region. Electronically Signed   By: Catherin Closs M.D.   On: 01/09/2024 14:23     Medications: Infusions:  anticoagulant sodium citrate      bivalirudin  (ANGIOMAX ) 250 mg in sodium chloride  0.9 % 500 mL (0.5 mg/mL) infusion 0.05 mg/kg/hr (01/10/24 1200)   norepinephrine  (LEVOPHED ) Adult infusion 9 mcg/min (01/10/24 1200)   PrismaSol  BGK 2/3.5 400 mL/hr at 01/10/24 0915   PrismaSol  BGK 2/3.5 400 mL/hr at 01/10/24 1132   prismasol  BGK 4/2.5 2,000 mL/hr at 01/10/24 1150   vancomycin  Stopped (01/09/24 2337)    Scheduled Medications:  Chlorhexidine  Gluconate Cloth  6 each Topical Daily   cinacalcet   30 mg Oral Q M,W,F   Darbepoetin Alfa   150 mcg Intravenous Q7 days   digoxin   0.125 mg Oral Q48H   feeding supplement (NEPRO CARB STEADY)  237 mL Oral BID BM   lactulose   30 g Oral BID   leptospermum manuka honey  1 Application Topical Daily   levothyroxine   25 mcg Oral Q0600   liver oil-zinc  oxide   Topical BID   midodrine   30 mg Oral  Q8H   multivitamin  1 tablet Oral QHS   mouth rinse  15 mL Mouth Rinse 4 times per day   polyethylene glycol  17 g Oral Daily   pregabalin   50 mg Oral Daily   senna  1 tablet Oral Daily   venlafaxine  XR  37.5 mg Oral Daily   zinc  sulfate (50mg  elemental zinc )  220 mg Oral Daily    have reviewed scheduled and prn medications.  Physical Exam: General:NAD, lying flat, sleepy but arousable Heart:RRR, s1s2 nl Lungs: Bilateral distant breath sound Abdomen:soft, mildly distended Extremities: 1-2+ Peripheral edema Dialysis Access: TDC  Ozro Russett 01/10/2024,1:31 PM  LOS: 7 days

## 2024-01-10 NOTE — Progress Notes (Addendum)
 PHARMACY - ANTICOAGULATION  Pharmacy Consult for bivalirudin    Indication: atrial fibrillation w/ hx of HIT   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, SRA positive   Metoprolol  Other (See Comments)    near syncope   Other Swelling and Other (See Comments)    Steroids Fluid seeping out of legs Nebulizer(unknown name of neb) elevated pressure    Tramadol      unknown    Patient Measurements: Height: 6' (182.9 cm) Weight: (!) 163 kg (359 lb 5.6 oz) IBW/kg (Calculated) : 77.6 HEPARIN  DW (KG): 125.9  Vital Signs: Temp: 97.6 F (36.4 C) (06/17 1534) Temp Source: Oral (06/17 1534) BP: 101/64 (06/17 1400) Pulse Rate: 112 (06/17 1400)  Labs: Recent Labs    01/08/24 0451 01/08/24 0453 01/09/24 0236 01/09/24 1708 01/10/24 0549 01/10/24 1433  HGB 9.3*  --  9.8*  --   --   --   HCT 28.7*  --  30.2*  --   --   --   PLT 261  --  281  --   --   --   APTT 80*   < > 61* 57* 56* 75*  CREATININE 1.70*   < > 1.65* 1.60* 1.42*  --    < > = values in this interval not displayed.    Estimated Creatinine Clearance: 111.5 mL/min (A) (by C-G formula based on SCr of 1.42 mg/dL (H)).  Assessment: 38 y.o. male with h/o Afib, Eliquis  on hold for CRRT, for argatroban  due to h/o HIT. Remains on CRRT. HgB 9.8 and PLTs 281, aPTT downtrending but remains elevated at 61. No baseline aPTT from this admission, T-bili continues to increase to 20.7, noted yellowing of sclera on exam. Argatroban  off since 6/13 around noon. Unclear if aPTT elevated due to liver dysfunction/congestion, underlying coagulopathy, or due to argatroban . Half life of argatroban  even with liver dysfunction approximately 190 minutes so would anticipate this to be out of patient's system given it has  been off since 6/13. T-bili during last admission around 3 with no baseline aPTT reading. INR upon admission 11/2023 was 3.5 INR this admission was 2.9 but noted on Eliquis  PTA.   aPTT has been stable x 3 readings 61,57, and 56. Suspect this to be patient's baseline. Due to liver impairment will transition to bival while on CRRT. In addition to Afib, patient high risk for PE with weight, low EF, and minimal movement.  aPTT 75 sec (slighlty subtherapeutic) on bivalirudin  at 0.05mg /kg/hr (adj BW 111.8 kg). No issues with line or bleeding reported per RN.   Goal of Therapy:  aPTT: 80-100, 1.5x baseline of approx 55  Monitor platelets by anticoagulation protocol: Yes   Plan:  Increase bivalirudin  to 0.055mg /kg/hr (Adj BW 111.8 kg). Recheck aPTT in 4 hours.  Continue with adjust aPTT targets given baseline aPTT ~55. Target aPTT per protocol is 50-85 however unreliable target with baseline elevation. Will target 1.5x baseline aPTT of 55 after discussion w/ provider.   Enrigue Harvard, PharmD, BCPS Please see amion for complete clinical pharmacist phone list 01/10/2024 3:53 PM   ADDENDUM (2045) aPTT down to 71 sec (still subtherapeutic for goal of 80-100). No issues with line or bleeding reported per RN.  Plan: Increase bivalirudin  to 0.065 mg/kg/hr (Adj BW  111.8 kg) Recheck aPTT in 4 hours  Enrigue Harvard, PharmD, BCPS Please see amion for complete clinical pharmacist phone list 01/10/2024 8:43 PM

## 2024-01-10 NOTE — TOC Progression Note (Signed)
 Transition of Care [facility]) - Progression Note    Patient Details  Name: Phillip Young MRN: [id number] Date of Birth: 1986/01/01  Transition of Care [facility]) CM/SW Contact  Juliane Och, LCSW Phone Number: 01/10/2024, 3:47 PM  Clinical Narrative:     3:47 PM Per chart review, patient has decided to continue aggressive measures upon GOC meeting. Palliative Care team will continue to follow.  Expected Discharge Plan: Long Term Acute Care (LTAC) Barriers to Discharge: Continued Medical Work up  Expected Discharge Plan and Services       Living arrangements for the past 2 months: Apartment                             HH Agency: [facility]         Social Determinants of Health (SDOH) Interventions SDOH Screenings   Food Insecurity: No Food Insecurity (01/08/2024)  Recent Concern: Food Insecurity - Food Insecurity Present (11/01/2023)  Housing: High Risk (01/08/2024)  Transportation Needs: Unmet Transportation Needs (01/08/2024)  Utilities: At Risk (01/08/2024)  Alcohol  Screen: Low Risk  (10/06/2023)  Depression (PHQ2-9): High Risk (10/06/2023)  Financial Resource Strain: Low Risk  (10/06/2023)  Physical Activity: Insufficiently Active (10/06/2023)  Social Connections: Socially Isolated (10/06/2023)  Stress: No Stress Concern Present (10/06/2023)  Tobacco Use: Medium Risk (12/14/2023)  Health Literacy: Adequate Health Literacy (10/06/2023)    Readmission Risk Interventions    12/15/2023    2:07 PM 10/13/2023   11:27 AM 09/23/2023    5:05 PM  Readmission Risk Prevention Plan  Transportation Screening Complete Complete Complete  Medication Review (RN Care Manager) Referral to Pharmacy Referral to Pharmacy Referral to Pharmacy  PCP or Specialist appointment within 3-5 days of discharge Complete  Complete  HRI or Home Care Consult Complete Complete Complete  SW Recovery Care/Counseling Consult Complete Complete Complete  Palliative Care Screening Not  Applicable Not Applicable Not Applicable  Skilled Nursing Facility Not Applicable Not Applicable Not Applicable

## 2024-01-10 NOTE — Progress Notes (Signed)
 PT Cancellation Note  Patient Details Name: Phillip Young MRN: [id number] DOB: [birth year]   Cancelled Treatment:    Reason Eval/Treat Not Completed: (P) Other (comment) (pt eating dinner meal.) Will continue efforts per PT plan of care as schedule permits.   Mariel Shope Mirha Brucato 01/10/2024, 4:50 PM

## 2024-01-11 DIAGNOSIS — R1084 Generalized abdominal pain: Secondary | ICD-10-CM | POA: Diagnosis not present

## 2024-01-11 DIAGNOSIS — R579 Shock, unspecified: Secondary | ICD-10-CM | POA: Diagnosis not present

## 2024-01-11 DIAGNOSIS — I5043 Acute on chronic combined systolic (congestive) and diastolic (congestive) heart failure: Secondary | ICD-10-CM | POA: Diagnosis not present

## 2024-01-11 LAB — HEPATIC FUNCTION PANEL
ALT: 33 U/L (ref 0–44)
AST: 60 U/L — ABNORMAL HIGH (ref 15–41)
Albumin: 1.6 g/dL — ABNORMAL LOW (ref 3.5–5.0)
Alkaline Phosphatase: 283 U/L — ABNORMAL HIGH (ref 38–126)
Bilirubin, Direct: 13.8 mg/dL — ABNORMAL HIGH (ref 0.0–0.2)
Indirect Bilirubin: 8.4 mg/dL — ABNORMAL HIGH (ref 0.3–0.9)
Total Bilirubin: 22.2 mg/dL (ref 0.0–1.2)
Total Protein: 6.8 g/dL (ref 6.5–8.1)

## 2024-01-11 LAB — RENAL FUNCTION PANEL
Albumin: 1.6 g/dL — ABNORMAL LOW (ref 3.5–5.0)
Albumin: 1.6 g/dL — ABNORMAL LOW (ref 3.5–5.0)
Anion gap: 13 (ref 5–15)
Anion gap: 10 (ref 5–15)
BUN: 14 mg/dL (ref 6–20)
BUN: 16 mg/dL (ref 6–20)
CO2: 21 mmol/L — ABNORMAL LOW (ref 22–32)
CO2: 21 mmol/L — ABNORMAL LOW (ref 22–32)
Calcium: 8.9 mg/dL (ref 8.9–10.3)
Calcium: 8.7 mg/dL — ABNORMAL LOW (ref 8.9–10.3)
Chloride: 99 mmol/L (ref 98–111)
Chloride: 99 mmol/L (ref 98–111)
Creatinine, Ser: 1.56 mg/dL — ABNORMAL HIGH (ref 0.61–1.24)
Creatinine, Ser: 1.56 mg/dL — ABNORMAL HIGH (ref 0.61–1.24)
GFR, Estimated: 58 mL/min — ABNORMAL LOW (ref >60)
GFR, Estimated: 58 mL/min — ABNORMAL LOW (ref >60)
Glucose, Bld: 83 mg/dL (ref 70–99)
Glucose, Bld: 86 mg/dL (ref 70–99)
Phosphorus: 2.8 mg/dL (ref 2.5–4.6)
Phosphorus: 2.9 mg/dL (ref 2.5–4.6)
Potassium: 4.2 mmol/L (ref 3.5–5.1)
Potassium: 4.3 mmol/L (ref 3.5–5.1)
Sodium: 133 mmol/L — ABNORMAL LOW (ref 135–145)
Sodium: 130 mmol/L — ABNORMAL LOW (ref 135–145)

## 2024-01-11 LAB — VANCOMYCIN, TROUGH: Vancomycin Tr: 22 ug/mL (ref 15–20)

## 2024-01-11 LAB — APTT
aPTT: 79 s — ABNORMAL HIGH (ref 24–36)
aPTT: 78 s — ABNORMAL HIGH (ref 24–36)
aPTT: 86 s — ABNORMAL HIGH (ref 24–36)
aPTT: 90 s — ABNORMAL HIGH (ref 24–36)

## 2024-01-11 LAB — MAGNESIUM: Magnesium: 2.1 mg/dL (ref 1.7–2.4)

## 2024-01-11 LAB — DIGOXIN LEVEL: Digoxin Level: 0.7 ng/mL — ABNORMAL LOW (ref 0.8–2.0)

## 2024-01-11 NOTE — Plan of Care (Signed)

## 2024-01-11 NOTE — Progress Notes (Addendum)
 Pharmacy Antibiotic Note  Phillip Young is a 38 y.o. male admitted on 01/03/2024 with hx of MRSE bacteremia presenting with new hypotension, initial concern for sepsis.  Pharmacy has been consulted for vancomycin  dosing. Patient ERSD, now on CRRT.   6/18 PM: Vancomycin  level (ongoing CRRT) still slightly elevated above goal at 22 (goal 15-20). Level drawn shortly after infusion was already started-most likely had no affect given short period of time  Plan: Decrease Vancomycin  to 1000mg  IV daily while on CRRT  Planned EOT 6/30 per ID for presumed MRSE endocarditis.  Monitor for transition back to HD to adjust vancomycin .   Height: 6' (182.9 cm) Weight: (!) 163 kg (359 lb 5.6 oz) IBW/kg (Calculated) : 77.6  Temp (24hrs), Avg:98.1 F (36.7 C), Min:96.9 F (36.1 C), Max:98.7 F (37.1 C)  Recent Labs  Lab 01/05/24 0347 01/05/24 0348 01/06/24 0442 01/06/24 0443 01/07/24 0246 01/07/24 0252 01/08/24 0451 01/08/24 0453 01/08/24 1035 01/08/24 1624 01/09/24 0236 01/09/24 1708 01/10/24 0549 01/10/24 1626 01/11/24 0841 01/11/24 1839 01/11/24 2143  WBC 20.7*  --  19.2*  --  17.7*  --  19.3*  --   --   --  21.3*  --   --   --   --   --   --   CREATININE 3.00*   < > 2.19*   < >  --    < > 1.70*   < >  --    < > 1.65* 1.60* 1.42* 1.57* 1.56* 1.56*  --   VANCOTROUGH  --   --   --   --   --   --   --   --  26*  --   --   --   --   --   --   --  22*   < > = values in this interval not displayed.    Estimated Creatinine Clearance: 101.5 mL/min (A) (by C-G formula based on SCr of 1.56 mg/dL (H)).    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, SRA positive   Metoprolol  Other (See Comments)    near syncope   Other Swelling and Other (See Comments)     Steroids Fluid seeping out of legs Nebulizer(unknown name of neb) elevated pressure    Tramadol      unknown    Antimicrobials this admission: Vancomycin  5/14 >> planned EOT 6/30  Cefepime  6/10 >> 6/11   Flagyl  6/10 >> 6/11  Micafungin  5/24 >> 6/5 at OSH, 6/11 >  S/p 1x linezolid  6/10  Microbiology results: 6/10 BCx: NGTD x < 24h  6/10 MRSA PCR: negative  5/25 Bcx: NGTD x final (OSH)  5/20 Bcx: 1/2 Candida tropicalis, Candida parapsilosis  5/13 Bcx: MRSE 2/2   Thank you for allowing pharmacy to be a part of this patient's care.[name], PharmD, BCPS Clinical Pharmacist 01/11/2024 11:01 PM

## 2024-01-11 NOTE — Progress Notes (Signed)
 PHARMACY - ANTICOAGULATION  Pharmacy Consult for bivalirudin    Indication: atrial fibrillation w/ hx of HIT  Brief A/P: aPTT subtherapeutic Increase bivalirudin  rate  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, SRA positive   Metoprolol  Other (See Comments)    near syncope   Other Swelling and Other (See Comments)    Steroids Fluid seeping out of legs Nebulizer(unknown name of neb) elevated pressure    Tramadol      unknown    Patient Measurements: Height: 6' (182.9 cm) Weight: (!) 163 kg (359 lb 5.6 oz) IBW/kg (Calculated) : 77.6 HEPARIN  DW (KG): 125.9  Vital Signs: Temp: 97.6 F (36.4 C) (06/17 2003) Temp Source: Oral (06/17 2003) BP: 95/61 (06/18 0100) Pulse Rate: 111 (06/18 0100)  Labs: Recent Labs    01/08/24 0451 01/08/24 0453 01/09/24 0236 01/09/24 1708 01/10/24 0549 01/10/24 1433 01/10/24 1626 01/10/24 1953 01/11/24 0058  HGB 9.3*  --  9.8*  --   --   --   --   --   --   HCT 28.7*  --  30.2*  --   --   --   --   --   --   PLT 261  --  281  --   --   --   --   --   --   APTT 80*   < > 61* 57* 56* 75*  --  71* 79*  CREATININE 1.70*   < > 1.65* 1.60* 1.42*  --  1.57*  --   --    < > = values in this interval not displayed.    Estimated Creatinine Clearance: 100.9 mL/min (A) (by C-G formula based on SCr of 1.57 mg/dL (H)).  Assessment: 38 y.o. male with h/o Afib, Eliquis  on hold, for bivalirudin   Goal of Therapy:  aPTT: 80-100 sec  (~1.5x baseline of 55 sec)  Monitor platelets by anticoagulation protocol: Yes   Plan:  Increase bivalirudin  to 0.08 mg/kg/hr  Follow-up am labs.  Claudine Cullens, PharmD, BCPS  01/11/2024 1:56 AM

## 2024-01-11 NOTE — Progress Notes (Signed)
 PHARMACY - ANTICOAGULATION  Pharmacy Consult for bivalirudin    Indication: atrial fibrillation w/ hx of HIT   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, SRA positive   Metoprolol  Other (See Comments)    near syncope   Other Swelling and Other (See Comments)    Steroids Fluid seeping out of legs Nebulizer(unknown name of neb) elevated pressure    Tramadol      unknown    Patient Measurements: Height: 6' (182.9 cm) Weight: (!) 163 kg (359 lb 5.6 oz) IBW/kg (Calculated) : 77.6 HEPARIN  DW (KG): 125.9  Vital Signs: Temp: 98.5 F (36.9 C) (06/18 1106) Temp Source: Oral (06/18 1106) BP: 105/69 (06/18 1400) Pulse Rate: 122 (06/18 1400)  Labs: Recent Labs    01/09/24 0236 01/09/24 1708 01/10/24 0549 01/10/24 1433 01/10/24 1626 01/10/24 1953 01/11/24 0058 01/11/24 0840 01/11/24 0841 01/11/24 1401  HGB 9.8*  --   --   --   --   --   --   --   --   --   HCT 30.2*  --   --   --   --   --   --   --   --   --   PLT 281  --   --   --   --   --   --   --   --   --   APTT 61*   < > 56*   < >  --    < > 79* 78*  --  86*  CREATININE 1.65*   < > 1.42*  --  1.57*  --   --   --  1.56*  --    < > = values in this interval not displayed.    Estimated Creatinine Clearance: 101.5 mL/min (A) (by C-G formula based on SCr of 1.56 mg/dL (H)).  Assessment: 38 y.o. male with h/o Afib, Eliquis  on hold for CRRT, for argatroban  due to h/o HIT. Remains on CRRT. HgB 9.8 and PLTs 281, aPTT downtrending but remains elevated at 61. No baseline aPTT from this admission, T-bili continues to increase to 20.7, noted yellowing of sclera on exam. Argatroban  off since 6/13 around noon. Unclear if aPTT elevated due to liver dysfunction/congestion, underlying coagulopathy, or due  to argatroban . Half life of argatroban  even with liver dysfunction approximately 190 minutes so would anticipate this to be out of patient's system given it has been off since 6/13. T-bili during last admission around 3 with no baseline aPTT reading. INR upon admission 11/2023 was 3.5 INR this admission was 2.9 but noted on Eliquis  PTA.   aPTT has been stable x 3 readings 61,57, and 56. Suspect this to be patient's baseline. Due to liver impairment will transition to bival while on CRRT. In addition to Afib, patient high risk for PE with weight, low EF, and minimal movement.   aPTT therapeutic at 86 this afternoon while on bival 0.09mg /kghr, no issues noted. HgB 9.8 and PLT 281.   Goal of Therapy:  aPTT: 80-100, 1.5x baseline of approx 55  Monitor platelets by anticoagulation protocol: Yes   Plan:  Continue bival 0.09mg /kg/hr utilizing adjusted body weight.  Monitor for s/sx of bleeding.  Adjust aPTT targets given baseline aPTT is around 55. Target  aPTT per protocol is 50-85 however unreliable target with baseline elevation. Will target 1.5x baseline aPTT of 55 after discussion w/ provider.  Recheck aPTT in 4 hours.   Mamie Searles, PharmD, BCCCP  Please refer to [facility] for [facility] Pharmacy numbers 01/11/2024 3:11 PM

## 2024-01-11 NOTE — TOC Progression Note (Signed)
 Transition of Care [facility]) - Progression Note    Patient Details  Name: Phillip Young MRN: [id number] Date of Birth: 04-Sep-1985  Transition of Care [facility]) CM/SW Contact  Juliane Och, LCSW Phone Number: 01/11/2024, 2:43 PM  Clinical Narrative:     2:43 PM CSW provided SDOH (housing, transportation, utilities) resources. Per chart review, Palliative Care team continues to follow patient.  Expected Discharge Plan: Long Term Acute Care (LTAC) Barriers to Discharge: Continued Medical Work up  Expected Discharge Plan and Services       Living arrangements for the past 2 months: Apartment                             HH Agency[facility]         Social Determinants of Health (SDOH) Interventions SDOH Screenings   Food Insecurity: No Food Insecurity (01/08/2024)  Recent Concern: Food Insecurity - Food Insecurity Present (11/01/2023)  Housing: High Risk (01/08/2024)  Transportation Needs: Unmet Transportation Needs (01/08/2024)  Utilities: At Risk (01/08/2024)  Alcohol  Screen: Low Risk  (10/06/2023)  Depression (PHQ2-9): High Risk (10/06/2023)  Financial Resource Strain: Low Risk  (10/06/2023)  Physical Activity: Insufficiently Active (10/06/2023)  Social Connections: Socially Isolated (10/06/2023)  Stress: No Stress Concern Present (10/06/2023)  Tobacco Use: Medium Risk (12/14/2023)  Health Literacy: Adequate Health Literacy (10/06/2023)    Readmission Risk Interventions    12/15/2023    2:07 PM 10/13/2023   11:27 AM 09/23/2023    5:05 PM  Readmission Risk Prevention Plan  Transportation Screening Complete Complete Complete  Medication Review (RN Care Manager) Referral to Pharmacy Referral to Pharmacy Referral to Pharmacy  PCP or Specialist appointment within 3-5 days of discharge Complete  Complete  HRI or Home Care Consult Complete Complete Complete  SW Recovery Care/Counseling Consult Complete Complete Complete  Palliative Care Screening Not  Applicable Not Applicable Not Applicable  Skilled Nursing Facility Not Applicable Not Applicable Not Applicable

## 2024-01-11 NOTE — Progress Notes (Addendum)
 PT Cancellation Note  Patient Details Name: Phillip Young MRN: [id number] DOB: [birth year]   Cancelled Treatment:    Reason Eval/Treat Not Completed: (P) Patient declined, no reason specified (pt states check back next week.) Will continue efforts per PT plan of care as schedule permits.   Geremy Rister M Fareed Fung 01/11/2024, 11:14 AM

## 2024-01-11 NOTE — Progress Notes (Signed)
 Nutrition Follow-up  DOCUMENTATION CODES:  Morbid obesity  INTERVENTION:  Renal Multivitamin w/ minerals daily Nepro Shake po BID, each supplement provides 425 kcal and 19 grams protein Liberalize diet to regular due to increased nutritional needs Encourage PO intake  NUTRITION DIAGNOSIS:  Increased nutrient needs related to chronic illness as evidenced by estimated needs. - Ongoing   GOAL:  Patient will meet greater than or equal to 90% of their needs - Ongoing  MONITOR:  PO intake, Labs, I & O's, Weight trends, Skin  REASON FOR ASSESSMENT:  Consult Assessment of nutrition requirement/status (CRRT)  ASSESSMENT:  Pt with hx of CHF, COPD, GERD, HTN, morbid obesity, and ESRD on HD presented to ED with concern for wound infections after leaving [facility] the day prior. Pt with recent admission in May for similar presentation.  6/10 - Admitted  RD working remotely at time of follow-up. Discussed with RN, reports pt diet was just re-ordered. RN unsure of how pt was eating prior to today.  Pt remains on CRRT. Per meds history, Nepro shakes are ordered and pt has been refusing the past three days.   Meal Intakes 6/17: 20% x1 meal   Admission Weight: 180 kg Current Weight: 163 kg (6/16)  Nutrition Related Medications: Cinacalcet , Lactulose , Rena-vit, Miralax , Senokot, Zinc  Sulfate, IV antibiotics  Drips Levophed   Labs: Sodium 133, Potassium 4.2, Phosphorus 2.8, Magnesium  2.1  Net UF: 6551 mL x 24 hrs  I/O's: -17 L since admit  Diet Order:   Diet Order             Diet regular Room service appropriate? Yes with Assist; Fluid consistency: Thin; Fluid restriction: 2000 mL Fluid  Diet effective now                  EDUCATION NEEDS: No education needs have been identified at this time  Skin:  Skin Assessment: Reviewed RN Assessment  Last BM:  6/13  Height:  Ht Readings from Last 1 Encounters:  01/04/24 6' (1.829 m)   Weight:  Wt Readings from Last 1 Encounters:   01/09/24 (!) 163 kg   Ideal Body Weight:  80.9 kg  BMI:  Body mass index is 48.74 kg/m.  Estimated Nutritional Needs:  Kcal:  2400-2600 kcal/d Protein:  140-160 g/d Fluid:  1L+UOP   Doneta Furbish RD, LDN Clinical Dietitian

## 2024-01-11 NOTE — Progress Notes (Signed)
 PHARMACY - ANTICOAGULATION  Pharmacy Consult for bivalirudin    Indication: atrial fibrillation w/ hx of HIT   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, SRA positive   Metoprolol  Other (See Comments)    near syncope   Other Swelling and Other (See Comments)    Steroids Fluid seeping out of legs Nebulizer(unknown name of neb) elevated pressure    Tramadol      unknown    Patient Measurements: Height: 6' (182.9 cm) Weight: (!) 163 kg (359 lb 5.6 oz) IBW/kg (Calculated) : 77.6 HEPARIN  DW (KG): 125.9  Vital Signs: Temp: 98.5 F (36.9 C) (06/18 1918) Temp Source: Oral (06/18 1918) BP: 99/58 (06/18 2000) Pulse Rate: 116 (06/18 2000)  Labs: Recent Labs    01/09/24 0236 01/09/24 1708 01/10/24 1626 01/10/24 1953 01/11/24 0840 01/11/24 0841 01/11/24 1401 01/11/24 1839 01/11/24 2003  HGB 9.8*  --   --   --   --   --   --   --   --   HCT 30.2*  --   --   --   --   --   --   --   --   PLT 281  --   --   --   --   --   --   --   --   APTT 61*   < >  --    < > 78*  --  86*  --  90*  CREATININE 1.65*   < > 1.57*  --   --  1.56*  --  1.56*  --    < > = values in this interval not displayed.    Estimated Creatinine Clearance: 101.5 mL/min (A) (by C-G formula based on SCr of 1.56 mg/dL (H)).  Assessment: 38 y.o. male with h/o Afib, Eliquis  on hold for CRRT, for argatroban  due to h/o HIT. Remains on CRRT. HgB 9.8 and PLTs 281, aPTT downtrending but remains elevated at 61. No baseline aPTT from this admission, T-bili continues to increase to 20.7, noted yellowing of sclera on exam. Argatroban  off since 6/13 around noon. Unclear if aPTT elevated due to liver dysfunction/congestion, underlying coagulopathy, or due to argatroban . Half life of argatroban   even with liver dysfunction approximately 190 minutes so would anticipate this to be out of patient's system given it has been off since 6/13. T-bili during last admission around 3 with no baseline aPTT reading. INR upon admission 11/2023 was 3.5 INR this admission was 2.9 but noted on Eliquis  PTA.   aPTT has been stable x 3 readings 61,57, and 56. Suspect this to be patient's baseline. Due to liver impairment will transition to bival while on CRRT. In addition to Afib, patient high risk for PE with weight, low EF, and minimal movement.   aPTT therapeutic at 90 sec while on bival 0.0 9mg /kghr, no issues noted.  Goal of Therapy:  aPTT: 80-100, 1.5x baseline of approx 55  Monitor platelets by anticoagulation protocol: Yes   Plan:  Continue bival 0.09 mg/kg/hr utilizing adjusted body weight.  F/u a.m. aPTT Adjust aPTT targets given baseline aPTT is around 55. Target aPTT per protocol is 50-85 however unreliable target with baseline elevation. Will target 1.5x baseline aPTT of 55 after discussion w/ provider.  Enrigue Harvard, PharmD, BCPS Please see amion for complete clinical pharmacist phone list 01/11/2024 8:45 PM

## 2024-01-11 NOTE — Telephone Encounter (Signed)
 A new Referral is needed per below as the [facility] will need a new one placed as the old one was closed per message below:   Type Date User Summary Attachment  General 01/10/2024  8:55 AM Phillip Young Patient currently in hospital, advised new referral required -  Note: Patient currently in hospital, advised new referral required .   Copied from CRM [id number]. Topic: Referral - Request for Referral >> Jan 09, 2024  4:05 PM Lenon Radar A wrote: Did the patient discuss referral with their provider in the last year? Yes (If No - schedule appointment) (If Yes - send message)  Appointment offered? No  Type of order/referral and detailed reason for visit: Speech Therapy  Preference of office, provider, location: N/A  If referral order, have you been seen by this specialty before? No (If Yes, this issue or another issue? When? Where?  Can we respond through MyChart? Yes

## 2024-01-11 NOTE — Progress Notes (Signed)
 PHARMACY - ANTICOAGULATION  Pharmacy Consult for bivalirudin    Indication: atrial fibrillation w/ hx of HIT   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, SRA positive   Metoprolol  Other (See Comments)    near syncope   Other Swelling and Other (See Comments)    Steroids Fluid seeping out of legs Nebulizer(unknown name of neb) elevated pressure    Tramadol      unknown    Patient Measurements: Height: 6' (182.9 cm) Weight: (!) 163 kg (359 lb 5.6 oz) IBW/kg (Calculated) : 77.6 HEPARIN  DW (KG): 125.9  Vital Signs: Temp: 97.8 F (36.6 C) (06/18 0751) Temp Source: Oral (06/18 0751) BP: 78/37 (06/18 0815) Pulse Rate: 118 (06/18 0815)  Labs: Recent Labs    01/09/24 0236 01/09/24 1708 01/10/24 0549 01/10/24 1433 01/10/24 1626 01/10/24 1953 01/11/24 0058 01/11/24 0840  HGB 9.8*  --   --   --   --   --   --   --   HCT 30.2*  --   --   --   --   --   --   --   PLT 281  --   --   --   --   --   --   --   APTT 61* 57* 56*   < >  --  71* 79* 78*  CREATININE 1.65* 1.60* 1.42*  --  1.57*  --   --   --    < > = values in this interval not displayed.    Estimated Creatinine Clearance: 100.9 mL/min (A) (by C-G formula based on SCr of 1.57 mg/dL (H)).  Assessment: 38 y.o. male with h/o Afib, Eliquis  on hold for CRRT, for argatroban  due to h/o HIT. Remains on CRRT. HgB 9.8 and PLTs 281, aPTT downtrending but remains elevated at 61. No baseline aPTT from this admission, T-bili continues to increase to 20.7, noted yellowing of sclera on exam. Argatroban  off since 6/13 around noon. Unclear if aPTT elevated due to liver dysfunction/congestion, underlying coagulopathy, or due to argatroban . Half life of argatroban  even with liver dysfunction approximately 190  minutes so would anticipate this to be out of patient's system given it has been off since 6/13. T-bili during last admission around 3 with no baseline aPTT reading. INR upon admission 11/2023 was 3.5 INR this admission was 2.9 but noted on Eliquis  PTA.   aPTT has been stable x 3 readings 61,57, and 56. Suspect this to be patient's baseline. Due to liver impairment will transition to bival while on CRRT. In addition to Afib, patient high risk for PE with weight, low EF, and minimal movement.   aPTT subtherapeutic at 78 this morning while on bival 0.08mg /kghr, no issues noted. HgB and PLT pending from this AM.   Goal of Therapy:  aPTT: 80-100, 1.5x baseline of approx 55  Monitor platelets by anticoagulation protocol: Yes   Plan:  Increase bival 0.09mg /kg/hr utilizing adjusted body weight. ~10% increase in rate.  Monitor for s/sx of bleeding.  Adjust aPTT targets given baseline aPTT is around 55. Target aPTT per protocol is 50-85 however unreliable target with baseline elevation. Will target 1.5x baseline aPTT of 55 after discussion w/ provider.  Recheck aPTT in 4 hours.   Mamie Searles, PharmD,  BCCCP  Please refer to [facility] for [facility] Pharmacy numbers 01/11/2024 9:47 AM

## 2024-01-11 NOTE — Progress Notes (Signed)
 Daily Progress Note   Patient Name: Phillip Young       Date: 01/11/2024 DOB: [birth year]  Age: 38 y.o. MRN#: [id number] Attending Physician: Joesph Mussel, DO Primary Care Physician: Carleen Chary, DO Admit Date: 01/03/2024  Reason for Consultation/Follow-up: Establishing goals of care  Subjective: Medical records reviewed including progress notes, labs, imaging. Patient assessed at the bedside. He reports 10/10 pain and deferred medication to the end of our conversation. I shared that I tried to reach his mother to reschedule our follow up meeting but I was unable to reach her and requested a return call.  Created space and opportunity for patient's thoughts and feelings on patient's current illness. He shares that he has decided to remain a full code after talking with his mother, due to his mother's opinion that deciding otherwise would be selfish. I encouraged him to make the decisions he feel are best for himself. He is having a hard time with her continued restating of how she would have nobody without him.   We discussed whether he has considered a time-limited trial of aggressive care in light of his boundary of not wanting to remain in the hospital for a prolonged period ever again. He understands medical team's concern that he may not wean from pressors or from CRRT and states that's what they always say. Re-emphasized importance of considering his own limitations, even independently from prognosis during acute illness, given his end stage chronic diseases. He feels 2 weeks would be his limit, as this is how long he was in the hospital last time. I encouraged his complete compliance to care plan during this second week to give himself the best chance for any recovery if he is going to base his decisions on this.   Phillip Young then  called his mother to share the plan to revisit goals of care next Wednesday. She is agreeable to another meeting at that time and appreciative of support for him. She states that she knows it is his decision. He requested my assistance to explain to her how hard it is for him to be told of her needs for his survival and his selfishness. He also wanted me to explain that this is why he keeps trying, no matter what they say. I gently prompted her to give him reassurance and she was able to tell him that if he ultimately dies from this illness, life goes on and I will have to get over it. Patient then requested his pain medicine and more drinks, which I relayed to RN. Patient and family were encouraged to reach out to PMT if needs arise before our next meeting.  I also provided education on improbability of tetanus infection from his wounds.   Questions and concerns addressed. PMT will continue to support holistically.   Length of Stay: 8   Physical Exam Vitals and nursing note reviewed.  Constitutional:      General: He is not in acute distress.    Appearance: He is ill-appearing.   Cardiovascular:     Comments: AFIB Pulmonary:     Effort: Tachypnea present.   Skin:    General: Skin is warm and dry.   Neurological:  Mental Status: He is alert and oriented to person, place, and time.   Psychiatric:        Mood and Affect: Mood normal. Affect is labile.        Behavior: Behavior normal.             Vital Signs: BP (!) 78/37 Comment: increased levo  Pulse (!) 118   Temp 97.8 F (36.6 C) (Oral)   Resp (!) 29   Ht 6' (1.829 m)   Wt (!) 163 kg   SpO2 91%   BMI 48.74 kg/m  SpO2: SpO2: 91 % O2 Device: O2 Device: Nasal Cannula O2 Flow Rate: O2 Flow Rate (L/min): 5 L/min      Palliative Assessment/Data: 30%   Palliative Care Assessment & Plan   Patient Profile: 38 y.o. male  with past medical history of biventricular HF (EF < 20%), chronic afib on Eliquis , chronic  hypotension on midodrine  30mg  TID, ESRD, chronic hypoxic and hypercarbic respiratory failure on NIV, morbid obesity, medical non-compliance  admitted on 01/03/2024 with pain, left wound weeping.    Patient has had 7 hospitalizations in the past 6 months. Most recent admit 5/13-5/22 for recurrent septic shock and MRSE bacteremia due to suspected line complicated by acute on chronic HF and respiratory failure, suspected IE but not a candidate for TEE, discharged on vancomycin  with HD scheduled thru 6/30.     He is currently admitted for shock (cardiogenic, septic shock likely as well) with acute on chronic CHF and requiring pressors. He has multiple wounds and ulcers but refusing dressing changes. Also with constipation and opting against bowel regimen.    PMT has been consulted to assist with goals of care conversation at patient's request.  Assessment: Goals of care conversation End-stage renal disease on dialysis Cardiogenic shock Acute on chronic CHF biventricular Chronic pain  Recommendations/Plan: Continue full code/full scope treatment Patient states he desires a time-limited trial of aggressive care for a total of two weeks Follow up GOC meeting tentatively Wednesday 6/25, time TBD Psychosocial and emotional support provided PMT will continue to follow and support   Prognosis: Poor  Discharge Planning: To Be Determined    MDM high         Aubriana Ravelo Alroy Jericho, PA-C  Palliative Medicine Team Team phone # [phone]  Thank you for allowing the Palliative Medicine Team to assist in the care of this patient. Please utilize secure chat with additional questions, if there is no response within 30 minutes please call the above phone number.  Palliative Medicine Team providers are available by phone from 7am to 7pm daily and can be reached through the team cell phone.  Should this patient require assistance outside of these hours, please call the patient's attending physician.

## 2024-01-11 NOTE — Progress Notes (Signed)
 OT Cancellation Note  Patient Details Name: Phillip Young MRN: [id number] DOB: 03-25-1986   Cancelled Treatment:    Reason Eval/Treat Not Completed: Patient declined, no reason specified (reports he wants therapy to check back next week)  Beanca Kiester K, OTD, OTR/L SecureChat Preferred Acute [facility] [phone]   Antionette Kirks 01/11/2024, 11:35 AM

## 2024-01-11 NOTE — Progress Notes (Signed)
 Phillip Young  Assessment/ Plan: Pt is a 38 y.o. yo male with past medical history of biventricular heart failure EF less than 20, chronic A-fib, ESRD on HD, chronic hypotension on high-dose midodrine , nonadherence with outpatient HD and treatment, recently at [facility] presented with shock and volume overload.  # Shock, cardiogenic:  12/06/23 EF < 20%. Sepsis less likely with neg cultures, abx off. Managing UF with HD.  Currently on Levophed  with dose titration.   # ESRD:  on HD since 01/2021.  Was [facility] MWF, noncompliant with outpatient HD with chronic hyperkalemia.  Currently on CRRT, UF as tolerated - continue  50- 100 cc an hour net negative.  Does not appear hemodynamics will allow iHD transition at this point.  Prognosis is very poor.  # Anemia of ESRD: Dosed Aranesp .  Transfuse as needed.  # Secondary hyperparathyroidism/hyperphosphatemia: Currently on CRRT.  Follow labs.  # Chronic hypotension/volume: On Levophed  and midodrine  for shock and receiving midodrine  to facilitate UF.  # a fib: usually rate controlled, this AM low 100s. Decrease UF rate today.   #HIT: avoiding heparin   GOC:  pt with poor prognosis and at times even refusing basic care, not abiding by fluid restriction.  He requested a pall care c/s- greatly appreciate their expertise,  Subjective: CRRT ongoing. No real change/ improvement  Objective Vital signs in last 24 hours: Vitals:   01/11/24 1030 01/11/24 1045 01/11/24 1100 01/11/24 1106  BP: (!) 85/50 (!) 97/39 (!) 90/57   Pulse: (!) 105 (!) 102 (!) 104   Resp: (!) 30 (!) 22 20   Temp:    98.5 F (36.9 C)  TempSrc:    Oral  SpO2: 95% 92% 95%   Weight:      Height:       Weight change:   Intake/Output Summary (Last 24 hours) at 01/11/2024 1226 Last data filed at 01/11/2024 1221 Gross per 24 hour  Intake 3026.92 ml  Output 6170.5 ml  Net -3143.58 ml       Labs: RENAL PANEL Recent Labs  Lab 01/07/24 0252  01/07/24 1707 01/08/24 0451 01/08/24 0453 01/09/24 0236 01/09/24 1708 01/10/24 0549 01/10/24 1208 01/10/24 1626 01/11/24 0840 01/11/24 0841  NA 133*   < > 131*   < > 132* 130* 131*  --  130*  --  133*  K 3.9   < > 3.8   < > 4.0 4.2 4.0  --  4.1  --  4.2  CL 99   < > 99   < > 99 98 100  --  96*  --  99  CO2 22   < > 23   < > 22 22 23   --  22  --  21*  GLUCOSE 92   < > 116*   < > 101* 100* 88  --  123*  --  83  BUN 19   < > 17   < > 16 16 17   --  16  --  14  CREATININE 2.02*   < > 1.70*   < > 1.65* 1.60* 1.42*  --  1.57*  --  1.56*  CALCIUM  9.1   < > 8.8*   < > 9.2 8.7* 8.6*  --  8.6*  --  8.9  MG 2.3  --  2.3  --  2.3  --  2.1  --   --  2.1  --   PHOS 2.6   < >  --    < >  2.5 2.3* 2.7  --  2.5  --  2.8  ALBUMIN  1.8*   < > 1.7*   < > 1.7* 1.7* 1.6* 1.6* <1.5* 1.6* 1.6*   < > = values in this interval not displayed.    Liver Function Tests: Recent Labs  Lab 01/08/24 0451 01/08/24 0453 01/10/24 1208 01/10/24 1626 01/11/24 0840 01/11/24 0841  AST 87*  --  65*  --  60*  --   ALT 39  --  38  --  33  --   ALKPHOS 342*  --  296*  --  283*  --   BILITOT 20.7*  --  22.6*  --  22.2*  --   PROT 6.5  --  6.3*  --  6.8  --   ALBUMIN  1.7*   < > 1.6* <1.5* 1.6* 1.6*   < > = values in this interval not displayed.   No results for input(s): LIPASE, AMYLASE in the last 168 hours. Recent Labs  Lab 01/09/24 1059  AMMONIA 44*   CBC: Recent Labs    02/21/23 1538 02/22/23 0209 04/14/23 0314 04/15/23 1054 05/19/23 2020 05/20/23 1422 08/31/23 0348 09/01/23 0343 01/05/24 0347 01/06/24 0442 01/07/24 0246 01/08/24 0451 01/09/24 0236  HGB  --    < >  --    < >  --    < > 10.3*   < > 9.2* 9.3* 9.3* 9.3* 9.8*  MCV  --    < >  --    < >  --    < > 100.6*   < > 90.3 91.1 90.7 91.7 91.2  VITAMINB12  --   --  1,014*  --   --   --  815  --   --   --   --   --   --   FOLATE 12.0  --   --   --   --   --  7.9  --   --   --   --   --   --   FERRITIN  --   --   --   --  359*  --  241  --    --   --   --   --   --   TIBC  --   --   --   --  211*  --  364  --   --   --   --   --   --   IRON  --   --   --   --  64  --  58  --   --   --   --   --   --    < > = values in this interval not displayed.    Cardiac Enzymes: No results for input(s): CKTOTAL, CKMB, CKMBINDEX, TROPONINI in the last 168 hours. CBG: Recent Labs  Lab 01/06/24 2325  GLUCAP 88    Iron Studies: No results for input(s): IRON, TIBC, TRANSFERRIN, FERRITIN in the last 72 hours. Studies/Results: US  ABDOMEN LIMITED WITH LIVER DOPPLER Result Date: 01/11/2024 CLINICAL DATA:  Hyperbilirubinemia EXAM: DUPLEX ULTRASOUND OF LIVER TECHNIQUE: Color and duplex Doppler ultrasound was performed to evaluate the hepatic in-flow and out-flow vessels. COMPARISON:  CT abdomen pelvis with contrast 01/09/2024 FINDINGS: Liver: Normal parenchymal echogenicity. Mild diffuse nodularity of the hepatic contours. No focal lesion, mass or intrahepatic biliary ductal dilatation. Main Portal Vein size: 1.0 cm Portal Vein Velocities Main Prox:  43 cm/sec Main Mid:  40 cm/sec Main Dist:  45 cm/sec Right: 31 cm/sec Left: 22 cm/sec Hepatic Vein Velocities Right:  85 cm/sec Middle:  94 cm/sec Left:  57 cm/sec IVC: Present and patent with normal respiratory phasicity. Hepatic Artery Velocity:  97 cm/sec Splenic Vein Velocity:  19 cm/sec Spleen: Obscured by shadowing bowel gas. Portal Vein Occlusion/Thrombus: No Splenic Vein Occlusion/Thrombus: No Ascites: None Varices: None IMPRESSION: 1. Patent portal vein with appropriate direction of flow. 2. Nodular hepatic contour suspicious for cirrhosis. No focal hepatic lesion. 3. Unable to evaluate the spleen as it is obscured by overlying shadowing bowel gas. Spleen appears normal on recent CT from 01/09/2024. 4. Mild nonspecific thickening of the gallbladder wall, most likely a combination of underdistention and chronic hepatitis. Electronically Signed   By: Elester Grim M.D.   On: 01/11/2024 07:55    CT ABDOMEN PELVIS W CONTRAST Result Date: 01/09/2024 CLINICAL DATA:  Acute, non localized abdominal pain. Skin breakdown underneath pannus in the left lower quadrant. EXAM: CT ABDOMEN AND PELVIS WITH CONTRAST TECHNIQUE: Multidetector CT imaging of the abdomen and pelvis was performed using the standard protocol following bolus administration of intravenous contrast. RADIATION DOSE REDUCTION: This exam was performed according to the departmental dose-optimization program which includes automated exposure control, adjustment of the mA and/or kV according to patient size and/or use of iterative reconstruction technique. CONTRAST:  75mL OMNIPAQUE  IOHEXOL  350 MG/ML SOLN COMPARISON:  01/03/2024 FINDINGS: Lower chest: Stable enlarged heart. Decreased size of the 2 cm thick chronic pericardial effusion, currently measuring 1.2 cm in thickness mild diffuse ground-glass opacity at both lung bases no significant change. No pleural fluid. Stable small right lower lobe calcified granuloma. Hepatobiliary: Unremarkable liver. The gallbladder remains contracted and poorly visualized. Pancreas: Unremarkable. No pancreatic ductal dilatation or surrounding inflammatory changes. Spleen: Normal in size without focal abnormality. Adrenals/Urinary Tract: Stable mild left adrenal hyperplasia. Normal-appearing right adrenal gland, kidneys and ureters. The urinary bladder is empty and grossly unremarkable. Stomach/Bowel: Unremarkable stomach, small bowel and colon. The appendix is not visualized. No secondary signs of appendicitis. Vascular/Lymphatic: No significant vascular findings are present. No enlarged abdominal or pelvic lymph nodes. Reproductive: Prostate is unremarkable. Other: Bilateral cutaneous and subcutaneous edema with little change. No soft tissue gas. Stable areas of subcutaneous fat necrosis and scarring in the sacral region. Small umbilical and right inguinal hernias containing fat. Musculoskeletal: Minimal lower  thoracic spine degenerative changes. No evidence of osteomyelitis. IMPRESSION: 1. No acute abnormality. 2. Stable cardiomegaly. 3. Decreased size of the chronic pericardial effusion. 4. Stable mild diffuse ground-glass opacity at both lung bases, likely due to interstitial edema 5. Stable bilateral cutaneous and subcutaneous edema. 6. Stable areas of subcutaneous fat necrosis and scarring in the sacral region. Electronically Signed   By: Catherin Closs M.D.   On: 01/09/2024 14:23     Medications: Infusions:  anticoagulant sodium citrate      bivalirudin  (ANGIOMAX ) 250 mg in sodium chloride  0.9 % 500 mL (0.5 mg/mL) infusion 0.09 mg/kg/hr (01/11/24 1200)   norepinephrine  (LEVOPHED ) Adult infusion 9 mcg/min (01/11/24 1200)   PrismaSol  BGK 2/3.5 400 mL/hr at 01/11/24 1023   PrismaSol  BGK 2/3.5 400 mL/hr at 01/11/24 0009   prismasol  BGK 4/2.5 2,000 mL/hr at 01/11/24 1020   vancomycin  Stopped (01/11/24 0114)    Scheduled Medications:  Chlorhexidine  Gluconate Cloth  6 each Topical Daily   cinacalcet   30 mg Oral Q M,W,F   Darbepoetin Alfa   150 mcg Intravenous Q7 days   digoxin   0.125 mg Oral Q48H  feeding supplement (NEPRO CARB STEADY)  237 mL Oral BID BM   lactulose   30 g Oral BID   leptospermum manuka honey  1 Application Topical Daily   levothyroxine   25 mcg Oral Q0600   liver oil-zinc  oxide   Topical BID   midodrine   40 mg Oral Q8H   multivitamin  1 tablet Oral QHS   mouth rinse  15 mL Mouth Rinse 4 times per day   polyethylene glycol  17 g Oral Daily   pregabalin   50 mg Oral Daily   senna  1 tablet Oral Daily   venlafaxine  XR  37.5 mg Oral Daily   zinc  sulfate (50mg  elemental zinc )  220 mg Oral Daily    have reviewed scheduled and prn medications.  Physical Exam: General:NAD, lying flat, sleepy but arousable Heart:RRR, s1s2 nl Lungs: Bilateral distant breath sound Abdomen:soft, mildly distended Extremities: 1-2+ Peripheral edema Dialysis Access: TDC  Phillip Young,  Phillip Young 01/11/2024,12:26 PM  LOS: 8 days

## 2024-01-11 NOTE — Progress Notes (Signed)
 NAME:  Phillip Young, MRN:  [id number], DOB:  Apr 26, 1986, LOS: 8 ADMISSION DATE:  01/03/2024, CONSULTATION DATE:  01/03/24 REFERRING MD:  Dr. Val Garin, CHIEF COMPLAINT:  SOB/ chills   History of Present Illness:   52 yoM w/PMH as below significant for biventricular HF (EF < 20%), chronic afib on Eliquis , chronic hypotension on midodrine  30mg  TID, ESRD, chronic hypoxic and hypercarbic respiratory failure on NIV, morbid obesity, medical non-compliance, and chronic pain presenting from home after being discharged from [facility] 6/9 with SOB, chills, subjective fever, ongoing poor PO, vomiting today strings of blood but then reported it was with coughing episodes, pain all over, and increased weeping from left thigh blister.  Had BM, unclear on consistency/ color.  Reports last dialysis yesterday.  Initially reports not taking any PO meds today but then said he did this morning.  Non- ambulatory at baseline.  Frequent admits, most recently 5/13-5/22 for recurrent septic shock and MRSE bacteremia due to suspected line complicated by acute on chronic HF and respiratory failure, suspected IE but not a candidate for TEE, discharged on vancomycin  with HD scheduled thru 6/30.  TDC was replaced 5/22 after 2 day line holiday.  Also started on micafungin  w/HD at [facility] after [facility] grew candida species, scheduled thru 6/30.   In ER, temp 99.1, on HFNC at 6L, with SBP into 70's requiring NE support after 500 ml bolus.  Labs significant for Na 129, K 6.3, Cl 89, alk phos 346, AST 84, t.bili 12 (recent 3.4 on 5/14), lactic 3.8, WBC 20.6 (previously 8.1 on 5/22), INR 2.9, CXR showing vascular congestion and increasing congestion and pleural effusions.  BC sent and empirically started on linezolid , flagyl , and cefepime .  Pending CT chest/abd/ pelvis.  Nephrology consulted.  PCCM called for admit.   Pertinent  Medical History   Past Medical History:  Diagnosis Date   Acute on chronic respiratory failure with hypoxia ([name])  04/21/2021   Acute on chronic systolic (congestive) heart failure ([name]) 02/26/2020   Amiodarone  toxicity    Anemia    Biventricular congestive heart failure ([name])    Candida parapsilosis infection 12/26/2023   Chronic hypoxemic respiratory failure ([name])    Class 3 severe obesity due to excess calories with serious comorbidity and body mass index (BMI) of 50.0 to 59.9 in adult 02/26/2020   ESRD on hemodialysis [facility])    Essential hypertension 02/26/2020   GERD without esophagitis 02/26/2020   Hidradenitis suppurativa 02/26/2020   NICM (nonischemic cardiomyopathy) ([name])    Obesity hypoventilation syndrome ([name])    OSA (obstructive sleep apnea)    Pneumonia    Prediabetes 02/26/2020   Significant Hospital Events: Including procedures, antibiotic start and stop dates in addition to other pertinent events   6/10 Admit 6/12 Remains on vasopressors.  Spoke to heart failure.  Not a candidate for advanced therapies.  Not a candidate for tailored hemodynamics.  Recommend a strategy of vasopressor support at fluid removal in an attempt to transition back to IHD. 6/15 patient brought up discussion of limiting some of his care; Palliative re-consulted.  6/16 palliative consulted per patient's request. CT unrevealing for cause of abdominal pain.  Interim History / Subjective:  He denies abdominal pain today. No other complaints at the moment.   Objective    Blood pressure (!) 95/52, pulse (!) 105, temperature (!) 96.9 F (36.1 C), temperature source Axillary, resp. rate 18, height 6' (1.829 m), weight (!) 163 kg, SpO2 95%.        Intake/Output Summary (  Last 24 hours) at 01/11/2024 0723 Last data filed at 01/11/2024 0700 Gross per 24 hour  Intake 2771.89 ml  Output 6551 ml  Net -3779.11 ml   Filed Weights   01/07/24 0500 01/08/24 0500 01/09/24 0305  Weight: (!) 170.9 kg (!) 164.7 kg (!) 163 kg   Examination: General: chronically ill appearing man lying in bed sleeping HEENT:  [place]/AT,  eyes icteric Neuro: awake, answering questions, not moving much CV: S1S2, RRR PULM: breathing comfortably on , reduced basilar breath sounds GI: soft, NT. Indurated area of chronic edema in lower abdomen on pannus Extremities: minimal edema Psych: poor insight, cooperative with exam  I/O -3.8L, net - 17.1L for the admission  AM labs not collected.   RUQ US  with doppler: patent portal veins, likely cirrhosis, underdistended GB with mild wall thickening   Resolved problem list:   Abdominal pain; worry this is related to his constipation Acute metabolic encephalopathy; suspect pain meds may be contributing to this some  Assessment and Plan   Shock ([name])> suspect primarily cardiogenic; sepsis also likely with leukocytosis, but no source has been identified. RUQ US  was reassuring. -con't NE to maintain MAP >60  -midodrine  increased to 40mg  TID -con't vanc for cellulitis -Not a candidate for advanced heart failure therapies; optimize getting fluid off with CRRT using pressors> not sure we are making much headway honestly.  Hyperbilirubinemia-- congestive vs low forward flow. Imaging has been inconsistent, but possible cirrhosis on recent RUQ US > his picture would fit for cardiac cirrhosis. No signs of obstruction.  Acaculous cholecystitis is possible> no suggestion of this on any of his imaging. Patent hepatic portal veins so no Budd Chiari. Low suspicion for DILI with normal transaminases. Cirrhosis would fit his picture the best honestly-- coagulopathy, low albumin , low BP, hyperbilirubinemia, hyperammonemia -daily LFTs  Acute on chronic biventricular congestive heart failure ([name])- sysolic & diastolic; known NICM. Echo shows severe RV and LV failure with EF <20%. Non-compliant for many years. Per HF service, not a candidate for advance therapies. Options for GDMT limited due to hypotension. Cannot take SGLT-2 due to skin infection risk.  Goal is CRRT to optimize volume status and  cardiac function, but he is making no progress coming off pressors If he is unable to wean off pressors, hospice care will be the only reasonable option.  Right now I think it is highly unlikely he will be able to resume iHD with his current status. Palliative care reconsulted at the patient's request. He has been back and forth on what he wants.  He remains at high risk for decompensation this admission.   ESRD on hemodialysis - Remains on CRRT.  Has pattern of developing fluid overload and hypotension due to inability to tolerate iHD with inadequate fluid removal and noncompliance with diet and fluid restriction. Con't CRRT per nephro; not clear when he will get back to [facility] or if he will be able to Con't phos binders  Constipation-- no bowel movement the entire admission -has been refusing his bowel regimen more often than not -con't bowel regimen  Obesity hypoventilation syndrome - Intermittently compliant with home BIPAP. Recommend at bedtime bipap; he has not been using  Permanent atrial fibrillation  -intolerance of amiodarone  -con't bival; with high baseline PTT he is at risk of being under or over anticoagulated -con't digoxin  q28h with periodic level checks  Chronic pain disorder Con't PTA lyrica  and oxycodone  PRN dilaudid   Mild hyperammoniemia -con't lactulose  BID  Skin ulceration; recent cellulitis  Has chronic  skin ulceration, and he routinely has been refusing dressing changes. He understands he will not be able to make progress if we cannot keep his wounds clean. On examination: multiple areas of covered wound but no drainage or discoloration.  Complete vancomycin  course   Long term prognosis grim. May not have good options if he is unable to tolerated coming off CRRT back to [facility].     Best Practice (right click and Reselect all SmartList Selections daily)   Diet/type: Regular consistency (see orders) DVT prophylaxis SCD, start bival gtt Pressure ulcer(s):  present on admission  GI prophylaxis: PPI Lines: Dialysis Catheter-pta R TDC (12/15/23) Foley:  N/A Code Status:  full code Last date of multidisciplinary goals of care discussion - 6/17 with PMT   This patient is critically ill with multiple organ system failure which requires frequent high complexity decision making, assessment, support, evaluation, and titration of therapies. This was completed through the application of advanced monitoring technologies and extensive interpretation of multiple databases. During this encounter critical care time was devoted to patient care services described in this note for 38 minutes.  Joesph Mussel, DO 01/11/24 8:39 AM [facility]  For contact information, see Amion. If no response to pager, please call PCCM consult pager. After hours, 7PM- 7AM, please call Elink.

## 2024-01-11 NOTE — Discharge Instructions (Signed)
 [employer] assistance programs Crisis assistance programs  -[facility]eless[facility] Arts development officer. If you are experiencing homelessness in [place], [place] , your first point of contact should be Pensions consultant. You can reach Coordinated Entry by calling (336) [phone] or by emailing [email or web address].  Community access points: [employer] [phone], [place]) every Tuesday from 9am-10am. [facility] ([address] [place]. [address], [place]) every Wednesday from 8am-9am.   -[place] Coordinated Re-entry [name]: Dial 211 and request. Offers referrals to homeless shelters in the area.    -The [employer] 339-464-0057) offers several services to local families, as funding allows. The Emergency Assistance Program (EAP), which they administer, provides household goods, free food, clothing, and financial aid to people in need in the [place]  area. The EAP program does have some qualification, and counselors will interview clients for financial assistance by written referral only. Referrals need to be made by the Department of Social Services or by other EAP approved human services agencies or charities in the area.  -Open [facility] Minist[facility] of [employer], which can be reached at [phone], offers emergency assistance programs for those in need of help, such as food, rent assistance, a soup kitchen, shelter, and clothing. They are based in [facility] [place]  but provide a number of services to those that qualify for assistance.   [employer] Department of Social Services may be able to offer temporary financial assistance and cash grants for paying rent and utilities, Help may be provided for local county residents who may be experiencing personal crisis when other resources, including government programs, are not available. Call [phone]  -High [employer] is a [employer] agency, The organization can offer emergency assistance for paying rent, Care[employer] Rx, utilities, food, household products and furniture. They offer extensive emergency and transitional housing for families, children and single women, and also run a Boy's and [employer]. Thrift Shops, Secondary school teacher, and other aid offered too. [address], [place], [place]  [address], [phone]  -[place] Low Income Energy Assistance Program -- This is offered for [facility] families. The federal government created [employer] Program provides a one-time cash grant payment to help eligible low-income families pay their electric and heating bills. [address], [place], [place]  [address], [phone]  -[place] Emergency Assistance -- A program offers emergency utility and rent funds for greater [employer] area residents. The program can also provide counseling and referrals to charities and government programs. Also provides food and a free meal program that serves lunch Mondays - Saturdays and dinner seven days per week to individuals in the community. [address] [place], [place]  [address], [phone]  -[employer] - Offers affordable apartment and housing communities across      [place] and [place]. The low income and seniors can access public housing, rental assistance to qualified applicants, and apply for the section 8 rent subsidy program. Other programs include Chiropractor and Engineer, maintenance. [address] [place], [place]  [address], dial [phone].  -The [facility] provides transitional housing to veterans and the disabled. Clients will also access other services too, including assistance in applying for Disability, life skills classes, case management, and assistance in finding permanent housing. [address], [place], [place]  [place] [address], call [phone]  -[place] Transitional Housing through [facility] is for people who were just  evicted or that are formerly homeless. The non-profit will also help then gain self-sufficiency, find a home or apartment to live in, and also provides information on rent assistance when needed. Phone [phone]  -The [place] [facility] [facility] helps low income, elderly, or disabled residents in seven counties in the [place] ([place], [place], [place], [place], [place], [place], [place], and [place]) save energy and reduce their utility bills by improving energy efficiency. Phone [phone].  -[employer] is located in the [place] [facility] in the [employer], [address], [place], [place] [address]. Parking is in the rear of the building. Phone: [phone]   General Email: Benn Brash  GHC provides free housing counseling assistance in locating affordable rental housing or housing with support services for families and individuals in crisis and the chronically homeless. We provide potential resources for other housing needs like utilities. Our trained counselors also work with clients on budgeting and financial literacy in effort to empower them to take control of their financial situations. [employer] collaborates with homeless service providers and other stakeholders as part of the [employer] COC (Continuum of Care). The (COC) is a regional/local planning body that coordinates housing and services funding for homeless families and individuals. The role of GHC in the COC is through housing counseling to work with people we serve on diversion strategies for those that are at imminent risk of becoming homeless. We also work with the Coordinated Assessment/Entry Specialist who attempts to find temporary solutions and/or connects the people  to Housing First, Rapid Re-housing or transitional housing programs. Our Homelessness Prevention Housing Counselors meet with clients on business days (Monday-Fridays, except scheduled holidays) from 8:30 am to 4:30 pm.  Legal assistance for evictions, foreclosure, and more -If you need free legal advice on civil issues, such as foreclosures, evictions, Electronics engineer, government programs, domestic issues and more, Landscape architect of [place]  [facility]) is a Associate Professor firm that provides free legal services and counsel to lower income people, seniors, disabled, and others, The goal is to ensure everyone has access to justice and fair representation. Call them at [phone].  [facility] for Housing and Community Studies can provide info about obtaining legal assistance with evictions. Phone [phone].  Data processing manager  The [employer], [employer]. offers job and Dispensing optician. Resources are focused on helping students obtain the skills and experiences that are necessary to compete in today's challenging and tight job market. The non-profit faith-based community action agency offers internship trainings as well as classroom instruction. Classes are tailored to meet the needs of people in the [facility] region. [place], [place] [address], [phone]  Foreclosure prevention/Debt Services Family Services of the [employer] Credit Counseling Service inludes debt and foreclosure prevention programs for local families. This includes money management, financial advice, budget review and development of a written action plan with a Pensions consultant to help solve specific individual financial problems. In addition, housing and mortgage counselors can also provide pre- and post-purchase homeownership counseling, default resolution counseling (to prevent foreclosure) and reverse mortgage counseling. A Debt Management Program allows  people and families with a high level of credit card or medical debt to consolidate and repay consumer debt and loans to creditors and rebuild positive credit ratings and scores. Contact ([phone] [phone].  Community clinics in [place] -Health Department [facility]: [address] [place], [place], [address]. [phone].  -Health Department [place] [facility]: [phone]  [address], [facility], [address]. [phone].  -[facility] offers medical care through a group of doctors, pharmacies and other healthcare related agencies that offer services for low income, uninsured adults in [place]. Also offers adult Dental care and assistance with applying for an [employer]. Call [phone].   [facility]. This center provides low-cost health care to those without health insurance. Services offered include an onsite pharmacy. Phone [phone]. [address], [place].  -Medication Assistance Program serves as a link between pharmaceutical companies and patients to provide low cost or free prescription medications. This service is available for residents who meet certain income restrictions and have no insurance coverage. PLEASE CALL [phone] Jonette Nestle) OR [phone] ([place])  -One Step Further: Materials engineer, The [employer] Support & Nutrition Program, [employer]. Call [phone]/ [phone].  Food pantry and assistance -[facility]-Food Bank: [address], [place] [address]. Phone [phone]  -[facility] Food Pantry: [address] [place], [place] [address]. [phone].  -Missionary Ministry: has the purpose of visiting the sick and shut-ins and provide for needs in the surrounding communities. Call [phone]. Email: [email or web address] This program provides: Food box for seniors, Financial assistance, Food to meet basic  nutritional needs.  -Meals on Wheels with Senior Resources: [facility] residents age 66 and over who are homebound and unable to obtain and prepare a nutritious meal for themselves are eligible for this service. There may be a waiting list in certain parts of [facility] if the route in that area is full. If you are in [facility] [place] and [place] call [phone] to register. For all other areas call [phone] to register.  -Greater Dietitian: [email or web address]://[email or web address]  TRANSPORTATION: -[employer] Department of Health: Call [facility] and [employer] at [phone] for details. AttractionGuides.es  -Access [place]: Access [place] is the [employer] Agency's shared-ride transportation service for eligible riders who have a disability that prevents them from riding the fixed route bus. Call [phone]. Access [place] riders must pay a fare of $1.50 per trip, or may purchase a 10-ride punch card for $14.00 ($1.40 per ride) or a 40-ride punch card for $48.00 ($1.20 per ride).  -The Shepherd's WHEELS rideshare transportation service is provided for senior citizens (60+) who live independently within [place] city limits and are unable to drive or have limited access to transportation. Call [phone] to schedule an appointment.  -[place] Transportation: For Medicare or Medicaid recipients call [phone]?[facility], wheelchair Carloyn Chi, and ambulatory quotes available.   FLEEING VIOLENCE: -Family Services of the [place]- 24/7 Crisis line [phone]) -[facility]: [phone]) [phone] 2076682092)  [place] 2-1-1 is another useful way to locate resources in the community. Visit ShedSizes.ch to find service information online. If you need additional assistance, 2-1-1 Referral Specialists are available 24 hours a day, every day by dialing  2-1-1 or [phone] from any phone. The call is free, confidential, and available in any language.  Affordable Housing Search http://www.nchousingsearch.[facility])   M-F 8a-3p [address]  [place], [place] [address] [phone] Services include: laundry, barbering, support groups, case management, phone & computer access, showers, AA/NA mtgs, mental health/substance abuse nurse, job skills class, disability information, VA assistance, spiritual classes, etc. Winter Shelter available when temperatures are less than 32 degrees.   HOMELESS SHELTERS [facility] at [facility]- Call [phone] ext. [phone]  or ext. [phone]. Located at [address] [place], [place] [address]  [facility] Mens Shelter- Call [phone]. Located at [address], [place] [address].  [facility]- [employer]. Call [phone]. Office located at [address] [place] [address].  Pathways Family Housing through [place] [phone].  [facility]- Call [phone]. Located at [address] [place], [place], [place] [address].  Room at the Inn-For Pregnant mothers. Call [phone]. Located at [address] [place], [address].  [place] [facility] of Hope-For men in [place]. Call [phone]. [address]-Shelter in [place]. Call [phone].  Home of [employer] for [employer] [phone]. Office located at [address], [place], [address].  [employer] be agreeable to help with chores. Call [phone] ext. [phone].  Men's: [address] [place], [place] [address]. Women's: [facility]  [address] [place], [place] [address]  Crisis Services [facility]atives Mobile Crisis Management- [phone]  [facility] [address], [place], [place] [address]. Phone: [phone] Rent/Utility Assistance in  [facility]:  INNOVATIVE PATHWAYS [address], [place], [place] [address] [phone] Mon 8:00am - 6:00pm; Tue 8:00am - 6:00pm; Wed 8:00am - 6:00pm; Thu 8:00am - 6:00pm; Fri 8:00am - 6:00pm; Email: [email or web address] Eligibility: Residents of [place], [place], [place], [place], [place] and [place] that meet income limits. Call or text for eligibility screening.   [facility] MINISTRY [address] [place], [place], [place] [address] [phone] (Main: Rental Assistance) [phone] (Main: Utility Assistance) Mon 8:30am - 5:00pm; Tue 8:30am - 5:00pm; Wed 8:30am - 5:00pm; Thu 8:30am - 5:00pm; Fri 8:30am - 5:00pm; Website: http://www.greensborourbanministry.org/emergency-assistance-program Eligibility: People who have an unexpected crisis or emergency that can be verified. Must have some form of income and meet income limits. At the first of the month, only helps with rent/mortgage assistance for those who have court ordered eviction notices. Call for application information. Call for exact documents that will be needed. Examples of documents that may be needed: Photo ID, Social Security cards for everyone in the household, and proof of income for previous 2 months. Copy of eviction notice for rent assistance and copy of final notice for utility assistance. Statements or receipts of bills for previous 2 months.   [facility] - [place] [address], [place], [place] [address] [phone] (Main) [phone] (Alternate) Mon 9:00am - 5:00pm; Tue 9:00am - 5:00pm; Wed 9:00am - 5:00pm; Thu 9:00am - 5:00pm; Fri 9:00am - 5:00pm; Website: [email or web address].[email or web address]/[place]/emergency-financial-assistance Email: [email or web address] Eligibility: People experiencing a housing crisis with past-due rent and/or utilities and meet income limits. Must be willing to take part in 6 Call or visit website to download  application. Return complete application by mail or email only. Documents: Help with Utilities: Photo ID, proof of household income, copies of monthly bills or receipts, and a final disconnection/shut-off notice. Help with Rent or Mortgage: Photo ID, proof of income, copies of monthly bills or receipts, and eviction notice. Help with Household Goods: Photo ID, proof of household income, copies of monthly bills or receipts, and a fire or flood report.  [facility] - [place] [address], [place], [place] [address] [phone] (Main) Mon 8:00am - 5:00pm; Tue 8:00am - 5:00pm; Wed 8:00am - 5:00pm; Thu 8:00am - 5:00pm; Fri 8:00am - 12:00pm; Website: http://[email or web address].salvationarmy.org/[place]/emergency-financial-assistance Email: [email or web address] Call for eligibility information. Apply :Utilities Assistance: Visit office by 8:30am on 1st and 4th Monday of each  month to pick up application. Rent and Mortgage Assistance: Visit office by 8:30am on 2nd and 3rd Monday of each month to pick up application. NOTE: If Monday falls on a holiday applications can be picked up the following Tuesday. Documents required will be listed on application.  [facility] [phone] (Main) Seen by appointment only. Call for more information. Eligibility: Meet income limits. Apply: Call for information on how to schedule an appointment. Each month there is a specific day to call to schedule an appointment. It is stated on the agency voicemail message. Appointments fill up quickly each month. Documents: Photo ID, copy of current utility bill.  [facility] HANDS [place] [address] [place], [place] [address] [phone] (Main) Tue 9:00am - 4:00pm; Wed 9:00am - 4:00pm; Thu 9:00am - 4:00pm; Website: http://www.[email or web address] Email: [email or web address] [email or web address] Eligibility: Utility Assistance: Meet income limits and be a Haematologist. [employer] customers do not qualify. Must not have received utility assistance for another agency within the last 90 days. Rent Assistance: Residents of [employer] who meet income limits. Must not have received rent assistance for another agency within the last 90 days. Apply: Call to schedule an appointment. Documents: Utility Assistance: Photo ID, City of [employer], copy of lease (if not paying a mortgage), proof of income, and monthly expenses. Rent Assistance: Photo ID, W-9 from the landlord, copy of the lease, proof of income, and a list of monthly expenses.  [facility]IST[facility] - [place] [address] [place], [place] [address] [phone] (Main: Help With Rent) [phone] (Main: Help With Utilities) Mon 9:00am - 4:00pm; Tue 9:00am - 4:00pm; Wed 9:00am - 4:00pm; Thu 9:00am - 4:00pm; Fri 9:00am - 4:00pm; Website: MotivationalSites.no Email: [email or web address] Eligibility: People experiencing a financial crisis. Apply: Call to schedule an appointment Wednesday, 7:30am. Documents: Photo ID, Social Security card, proof of income, and proof of address. Other documents may be required, depending on service. Call for more information.  LOW INCOME ENERGY ASSISTANCE PROGRAM DEPARTMENT OF SOCIAL SERVICES - [facility] [address] [place], [place] [address] [phone] (Main) Mon 8:00am - 5:00pm; Tue 8:00am - 5:00pm; Wed 8:00am - 5:00pm; Thu 8:00am - 5:00pm; Fri 8:00am - 5:00pm; Website: [email or web address] Eligibility: Meet income limits and resource guidelines. Each household is only eligible once, even if multiple members apply. Apply: Call to see if funds are available. Visit to complete an application, call to have 1 mailed, or apply online at [email or web address] NOTE: Households with a person age 2  and over or a person with a documented disability can apply beginning December 1. Other households can apply beginning January 1. Documents: Photo ID, birth certificate, proof of household income, copy of utility bill, latest bank statement, the names and Social Security numbers for everyone in the household, and proof of disability if under age 59.  LOW INCOME ENERGY ASSISTANCE PROGRAM DEPARTMENT OF SOCIAL SERVICES - [facility] [address] [place], [place], [place] [address] [phone] (Main) Mon 8:00am - 5:00pm; Tue 8:00am - 5:00pm; Wed 8:00am - 5:00pm; Thu 8:00am - 5:00pm; Fri 8:00am - 5:00pm; Website: [email or web address] Eligibility: Meet income limits and resource guidelines. Each household is only eligible once, even if multiple members apply. Apply: Call to see if funds are available. Visit to complete an application, call to have 1 mailed, or apply online at [email or web address] NOTE: Households with a person age 67 and over or a person with a documented  disability can apply beginning December 1. Other households can apply beginning January 1. Documents: Photo ID, birth certificate, proof of household income, copy of utility bill, latest bank statement, the names and Social Security numbers for everyone in the household, and proof of disability if under age 66.  TRANSPORTATION: -[place] Department of Health: Call [facility] and [employer] at [phone] for details. AttractionGuides.es  -Access [place]: Access [place] is the [employer]'s shared-ride transportation service for eligible riders who have a disability that prevents them from riding the fixed route bus. Call [phone]. Access [place] riders must pay a fare of $1.50 per trip, or may purchase a 10-ride punch card for $14.00 ($1.40 per ride) or a  40-ride punch card for $48.00 ($1.20 per ride).  -The Shepherd's WHEELS rideshare transportation service is provided for senior citizens (60+) who live independently within [place] city limits and are unable to drive or have limited access to transportation. Call [phone] to schedule an appointment.  -[place] Transportation: For Medicare or Medicaid recipients call [phone]?[facility], wheelchair Carloyn Chi, and ambulatory quotes available.   MEDICAID TRANSPORTATION: -If you have a Medicaid blue card or pink card and have no other means for transportation to doctor's offices, clinics, dentists, hospitals, and other health related trip needs.  -Transportation services are available to all [place] locations. Trips to [facility] and [place] are provided in association with PART. -Services are provided between 6:00AM and 9:00PM Monday-Friday. -Call [phone] to schedule a trip or request further information.

## 2024-01-12 ENCOUNTER — Ambulatory Visit: Admitting: Internal Medicine

## 2024-01-12 DIAGNOSIS — I5043 Acute on chronic combined systolic (congestive) and diastolic (congestive) heart failure: Secondary | ICD-10-CM | POA: Diagnosis not present

## 2024-01-12 DIAGNOSIS — R579 Shock, unspecified: Secondary | ICD-10-CM | POA: Diagnosis not present

## 2024-01-12 DIAGNOSIS — R1084 Generalized abdominal pain: Secondary | ICD-10-CM | POA: Diagnosis not present

## 2024-01-12 LAB — RENAL FUNCTION PANEL
Albumin: 1.7 g/dL — ABNORMAL LOW (ref 3.5–5.0)
Albumin: 1.6 g/dL — ABNORMAL LOW (ref 3.5–5.0)
Anion gap: 13 (ref 5–15)
Anion gap: 14 (ref 5–15)
BUN: 17 mg/dL (ref 6–20)
BUN: 17 mg/dL (ref 6–20)
CO2: 20 mmol/L — ABNORMAL LOW (ref 22–32)
CO2: 20 mmol/L — ABNORMAL LOW (ref 22–32)
Calcium: 9 mg/dL (ref 8.9–10.3)
Calcium: 9.2 mg/dL (ref 8.9–10.3)
Chloride: 97 mmol/L — ABNORMAL LOW (ref 98–111)
Chloride: 95 mmol/L — ABNORMAL LOW (ref 98–111)
Creatinine, Ser: 1.67 mg/dL — ABNORMAL HIGH (ref 0.61–1.24)
Creatinine, Ser: 1.81 mg/dL — ABNORMAL HIGH (ref 0.61–1.24)
GFR, Estimated: 53 mL/min — ABNORMAL LOW (ref >60)
GFR, Estimated: 48 mL/min — ABNORMAL LOW (ref >60)
Glucose, Bld: 95 mg/dL (ref 70–99)
Glucose, Bld: 98 mg/dL (ref 70–99)
Phosphorus: 2.8 mg/dL (ref 2.5–4.6)
Phosphorus: 3.1 mg/dL (ref 2.5–4.6)
Potassium: 4.2 mmol/L (ref 3.5–5.1)
Potassium: 4.3 mmol/L (ref 3.5–5.1)
Sodium: 130 mmol/L — ABNORMAL LOW (ref 135–145)
Sodium: 129 mmol/L — ABNORMAL LOW (ref 135–145)

## 2024-01-12 LAB — APTT: aPTT: 87 s — ABNORMAL HIGH (ref 24–36)

## 2024-01-12 LAB — HEPATIC FUNCTION PANEL
ALT: 36 U/L (ref 0–44)
AST: 66 U/L — ABNORMAL HIGH (ref 15–41)
Albumin: 1.7 g/dL — ABNORMAL LOW (ref 3.5–5.0)
Alkaline Phosphatase: 298 U/L — ABNORMAL HIGH (ref 38–126)
Bilirubin, Direct: 15.7 mg/dL — ABNORMAL HIGH (ref 0.0–0.2)
Indirect Bilirubin: 8.9 mg/dL — ABNORMAL HIGH (ref 0.3–0.9)
Total Bilirubin: 24.6 mg/dL (ref 0.0–1.2)
Total Protein: 7.4 g/dL (ref 6.5–8.1)

## 2024-01-12 LAB — MAGNESIUM: Magnesium: 2.3 mg/dL (ref 1.7–2.4)

## 2024-01-12 MED ORDER — VANCOMYCIN HCL IN DEXTROSE 1-5 GM/200ML-% IV SOLN: 1000.0000 mg | INTRAVENOUS | Status: DC

## 2024-01-12 MED ORDER — VANCOMYCIN VARIABLE DOSE PER UNSTABLE RENAL FUNCTION (PHARMACIST DOSING): Status: DC

## 2024-01-12 MED ORDER — URSODIOL 300 MG PO CAPS
300.0000 mg | ORAL_CAPSULE | Freq: Three times a day (TID) | ORAL | Status: DC
  Administered 2024-01-12 – 2024-01-16 (×12): 300 mg via ORAL
  Filled 2024-01-12 (×17): qty 1

## 2024-01-12 NOTE — Progress Notes (Signed)
 NAME:  Phillip Young, MRN:  [id number], DOB:  1986-05-03, LOS: 9 ADMISSION DATE:  01/03/2024, CONSULTATION DATE:  01/03/24 REFERRING MD:  Dr. Val Garin, CHIEF COMPLAINT:  SOB/ chills   History of Present Illness:   64 yoM w/PMH as below significant for biventricular HF (EF < 20%), chronic afib on Eliquis , chronic hypotension on midodrine  30mg  TID, ESRD, chronic hypoxic and hypercarbic respiratory failure on NIV, morbid obesity, medical non-compliance, and chronic pain presenting from home after being discharged from [facility] 6/9 with SOB, chills, subjective fever, ongoing poor PO, vomiting today strings of blood but then reported it was with coughing episodes, pain all over, and increased weeping from left thigh blister.  Had BM, unclear on consistency/ color.  Reports last dialysis yesterday.  Initially reports not taking any PO meds today but then said he did this morning.  Non- ambulatory at baseline.  Frequent admits, most recently 5/13-5/22 for recurrent septic shock and MRSE bacteremia due to suspected line complicated by acute on chronic HF and respiratory failure, suspected IE but not a candidate for TEE, discharged on vancomycin  with HD scheduled thru 6/30.  TDC was replaced 5/22 after 2 day line holiday.  Also started on micafungin  w/HD at [facility] after [facility] grew candida species, scheduled thru 6/30.   In ER, temp 99.1, on HFNC at 6L, with SBP into 70's requiring NE support after 500 ml bolus.  Labs significant for Na 129, K 6.3, Cl 89, alk phos 346, AST 84, t.bili 12 (recent 3.4 on 5/14), lactic 3.8, WBC 20.6 (previously 8.1 on 5/22), INR 2.9, CXR showing vascular congestion and increasing congestion and pleural effusions.  BC sent and empirically started on linezolid , flagyl , and cefepime .  Pending CT chest/abd/ pelvis.  Nephrology consulted.  PCCM called for admit.   Pertinent  Medical History   Past Medical History:  Diagnosis Date   Acute on chronic respiratory failure with hypoxia ([name])  04/21/2021   Acute on chronic systolic (congestive) heart failure ([name]) 02/26/2020   Amiodarone  toxicity    Anemia    Biventricular congestive heart failure ([name])    Candida parapsilosis infection 12/26/2023   Chronic hypoxemic respiratory failure ([name])    Class 3 severe obesity due to excess calories with serious comorbidity and body mass index (BMI) of 50.0 to 59.9 in adult 02/26/2020   ESRD on hemodialysis [facility])    Essential hypertension 02/26/2020   GERD without esophagitis 02/26/2020   Hidradenitis suppurativa 02/26/2020   NICM (nonischemic cardiomyopathy) ([name])    Obesity hypoventilation syndrome ([name])    OSA (obstructive sleep apnea)    Pneumonia    Prediabetes 02/26/2020   Significant Hospital Events: Including procedures, antibiotic start and stop dates in addition to other pertinent events   6/10 Admit 6/12 Remains on vasopressors.  Spoke to heart failure.  Not a candidate for advanced therapies.  Not a candidate for tailored hemodynamics.  Recommend a strategy of vasopressor support at fluid removal in an attempt to transition back to IHD. 6/15 patient brought up discussion of limiting some of his care; Palliative re-consulted.  6/16 palliative consulted per patient's request. CT unrevealing for cause of abdominal pain.  Interim History / Subjective:  He still has pain around his wounds.   Objective    Blood pressure 92/76, pulse (!) 110, temperature (!) 97.5 F (36.4 C), temperature source Oral, resp. rate 16, height 6' (1.829 m), weight (!) 163 kg, SpO2 97%.        Intake/Output Summary (Last 24 hours) at 01/12/2024  1610 Last data filed at 01/12/2024 0919 Gross per 24 hour  Intake 3732.48 ml  Output 6787.9 ml  Net -3055.42 ml   Filed Weights   01/07/24 0500 01/08/24 0500 01/09/24 0305  Weight: (!) 170.9 kg (!) 164.7 kg (!) 163 kg   Examination: General: chronically ill appearing man lying in bed, dressings on feet being changed HEENT:  [place]/AT, eyes  icteric Neuro: awake, answering some questions but slowly. Moving all extremities CV: S1S2, RRR PULM: breathing comfortably on [place], CTAB GI: obese, soft, NT.  Extremities: minimal edema, wounds dressed on LE Psych: cooperative with exam  I/O -3.2L, net - 20.2L for the admission  Na+  130 BUN 17 Cr 1.67 on CRRT AST 66 ALT 36 T bili 24.6, both indirect & direct  Vanc tough 22  RUQ US  with doppler: patent portal veins, likely cirrhosis, underdistended GB with mild wall thickening   Resolved problem list:   Abdominal pain; worry this is related to his constipation Acute metabolic encephalopathy; suspect pain meds may be contributing to this some  Assessment and Plan   Shock ([name])> suspect primarily cardiogenic; sepsis also likely with leukocytosis, but no source has been identified. RUQ US  was reassuring. -NE to maintain MAP >60; not really making much progress here -midodrine  40mg  TID -vanc for cellulitis -con't vanc for cellulitis -Not a candidate for advanced heart failure therapies; optimize getting fluid off with CRRT using pressors.  Hyperbilirubinemia-- congestive vs low forward flow. Synthetic function is the main issue. 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  Imaging has been inconsistent, but possible cirrhosis on recent RUQ US > his picture would fit for cardiac cirrhosis. No signs of obstruction.  Acaculous cholecystitis is possible> no suggestion of this on any of his imaging. Patent hepatic portal veins so no Budd Chiari. Low suspicion for DILI with normal transaminases. Cirrhosis would fit his picture the best honestly-- coagulopathy, low albumin , low BP, hyperbilirubinemia, hyperammonemia -daily LFTs -trial of ursodiol  Acute on chronic biventricular congestive heart failure ([name])- sysolic & diastolic; known NICM. Echo shows severe RV and LV failure with EF <20%. Non-compliant for many years. Per HF service, not a candidate for advance therapies. Options for GDMT limited due to  hypotension. Cannot take SGLT-2 due to skin infection risk.  Goal is CRRT to optimize volume status and cardiac function, but he is making no progress coming off pressors> so far no change in pressor requirements. If he is unable to wean off pressors, hospice care will be the only reasonable option.  Right now I think it is highly unlikely he will be able to resume iHD with his current status. Palliative care reconsulted at the patient's request earlier this week.  He remains at high risk for decompensation this admission.  Planning tomorrow to transition back to trial of iHD; long-term CRRT with iHD intolerance is not an option. D/w Nephrology  ESRD on hemodialysis - Remains on CRRT.  Has pattern of developing fluid overload and hypotension due to inability to tolerate iHD with inadequate fluid removal and noncompliance with diet and fluid restriction. CRRT; planning to switch to iHD tomorrow Phos binders  Constipation-- no bowel movement the entire admission -has been refusing his bowel regimen more often than not -con't bowel regimen  Obesity hypoventilation syndrome - Intermittently compliant with home BIPAP. Recommend at bedtime bipap; hasn't been using  Permanent atrial fibrillation  -amiodarone  intolerant -con't bival on CRRT, switch back to apixaban  when coming off CRRT -con't digoxin  q28h with periodic level checks  Chronic pain disorder  Con't PTA lyrica  & oxycodone ; has PRN dilaudid  for breakthrough  Mild hyperammonemia -lactulose  BID; he has been refusing  Skin ulceration; recent cellulitis  Has chronic skin ulceration, and he routinely has been refusing dressing changes. He understands he will not be able to make progress if we cannot keep his wounds clean. On examination: multiple areas of covered wound but no drainage or discoloration.  Vanc- dosing per pharmacy   Long term prognosis remains grim. No good options if he is not able to tolerate iHD. Appreciate Palliative  care team's management.    Best Practice (right click and Reselect all SmartList Selections daily)   Diet/type: Regular consistency (see orders) DVT prophylaxis SCD, bival gtt Pressure ulcer(s): present on admission  GI prophylaxis: PPI Lines: Dialysis Catheter-pta R TDC (12/15/23) Foley:  N/A Code Status:  full code Last date of multidisciplinary goals of care discussion - 6/17 with PMT   This patient is critically ill with multiple organ system failure which requires frequent high complexity decision making, assessment, support, evaluation, and titration of therapies. This was completed through the application of advanced monitoring technologies and extensive interpretation of multiple databases. During this encounter critical care time was devoted to patient care services described in this note for 36 minutes.  Joesph Mussel, DO 01/12/24 10:45 AM [facility]  For contact information, see Amion. If no response to pager, please call PCCM consult pager. After hours, 7PM- 7AM, please call Elink.

## 2024-01-12 NOTE — Progress Notes (Signed)
 Patient refuse to be turn in bed and to change his bed linen, patient educated on the importance of turning in bed every two hours  to prevent skin breakdown.

## 2024-01-12 NOTE — Progress Notes (Signed)
 Called mother Cranford Blessinger today.  I let her know we are planning on transitioning to [facility] tomorrow, coming off CRRT. She knows we are out of options if he cannot tolerate iHD. She thinks he should be able to now that we have fluid off.  We discussed his progressive liver dysfunction and likely cardiac cirrhosis.   Joesph Mussel, DO 01/12/24 10:51 AM [facility]  For contact information, see Amion. If no response to pager, please call PCCM consult pager. After hours, 7PM- 7AM, please call Elink.

## 2024-01-12 NOTE — Progress Notes (Signed)
 [facility] NEPHROLOGY PROGRESS NOTE  Assessment/ Plan: Pt is a 38 y.o. yo male with past medical history of biventricular heart failure EF less than 20, chronic A-fib, ESRD on HD, chronic hypotension on high-dose midodrine , nonadherence with outpatient HD and treatment, recently at [facility] presented with shock and volume overload.  # Shock, cardiogenic:  12/06/23 EF < 20%. Sepsis less likely with neg cultures, abx off. Managing UF with HD.   # ESRD:  on HD since 01/2021.  Was [facility] MWF, noncompliant with outpatient HD with chronic hyperkalemia.  Currently on CRRT, UF as tolerated - continue  50- 100 cc an hour net negative. Prognosis is very poor.  # Anemia of ESRD: Dosed Aranesp .  Transfuse as needed.  # Secondary hyperparathyroidism/hyperphosphatemia: Currently on CRRT.  Follow labs.  # Chronic hypotension/volume: On Levophed  and midodrine  for shock and receiving midodrine  to facilitate UF.  # a fib: usually rate controlled, this AM low 100s. Decrease UF rate today.   #HIT: avoiding heparin   GOC:  pt with poor prognosis and at times even refusing basic care, not abiding by fluid restriction.  He requested a pall care c/s- greatly appreciate their expertise.  Will stop CRRT this evening and observe hemodynamics with a trial of IHD if appropriate.  I anticipate not a lot to offer.    Subjective: Still no improvement.  D/w PCCM- not a lot of options to offer.  Will stop CRRT this evening and observe hemodynamics and appropriateness for IHD transition.    Objective Vital signs in last 24 hours: Vitals:   01/12/24 0915 01/12/24 0930 01/12/24 0945 01/12/24 1000  BP: 92/67 (!) 78/40 94/73   Pulse: (!) 116 88 (!) 101 97  Resp: (!) 23 (!) 22 (!) 25 (!) 22  Temp:      TempSrc:      SpO2: 99% 98% 98% 97%  Weight:      Height:       Weight change:   Intake/Output Summary (Last 24 hours) at 01/12/2024 1045 Last data filed at 01/12/2024 1000 Gross per 24 hour  Intake 3497.48  ml  Output 6856.5 ml  Net -3359.02 ml       Labs: RENAL PANEL Recent Labs  Lab 01/08/24 0451 01/08/24 0453 01/09/24 0236 01/09/24 1708 01/10/24 0549 01/10/24 1208 01/10/24 1626 01/11/24 0840 01/11/24 0841 01/11/24 1839 01/12/24 0422 01/12/24 0423  NA 131*   < > 132*   < > 131*  --  130*  --  133* 130*  --  130*  K 3.8   < > 4.0   < > 4.0  --  4.1  --  4.2 4.3  --  4.2  CL 99   < > 99   < > 100  --  96*  --  99 99  --  97*  CO2 23   < > 22   < > 23  --  22  --  21* 21*  --  20*  GLUCOSE 116*   < > 101*   < > 88  --  123*  --  83 86  --  95  BUN 17   < > 16   < > 17  --  16  --  14 16  --  17  CREATININE 1.70*   < > 1.65*   < > 1.42*  --  1.57*  --  1.56* 1.56*  --  1.67*  CALCIUM  8.8*   < > 9.2   < >  8.6*  --  8.6*  --  8.9 8.7*  --  9.0  MG 2.3  --  2.3  --  2.1  --   --  2.1  --   --  2.3  --   PHOS  --    < > 2.5   < > 2.7  --  2.5  --  2.8 2.9  --  2.8  ALBUMIN  1.7*   < > 1.7*   < > 1.6*   < > <1.5* 1.6* 1.6* 1.6* 1.7* 1.7*   < > = values in this interval not displayed.    Liver Function Tests: Recent Labs  Lab 01/10/24 1208 01/10/24 1626 01/11/24 0840 01/11/24 0841 01/11/24 1839 01/12/24 0422 01/12/24 0423  AST 65*  --  60*  --   --  66*  --   ALT 38  --  33  --   --  36  --   ALKPHOS 296*  --  283*  --   --  298*  --   BILITOT 22.6*  --  22.2*  --   --  24.6*  --   PROT 6.3*  --  6.8  --   --  7.4  --   ALBUMIN  1.6*   < > 1.6*   < > 1.6* 1.7* 1.7*   < > = values in this interval not displayed.   No results for input(s): LIPASE, AMYLASE in the last 168 hours. Recent Labs  Lab 01/09/24 1059  AMMONIA 44*   CBC: Recent Labs    02/21/23 1538 02/22/23 0209 04/14/23 0314 04/15/23 1054 05/19/23 2020 05/20/23 1422 08/31/23 0348 09/01/23 0343 01/05/24 0347 01/06/24 0442 01/07/24 0246 01/08/24 0451 01/09/24 0236  HGB  --    < >  --    < >  --    < > 10.3*   < > 9.2* 9.3* 9.3* 9.3* 9.8*  MCV  --    < >  --    < >  --    < > 100.6*   < > 90.3  91.1 90.7 91.7 91.2  VITAMINB12  --   --  1,014*  --   --   --  815  --   --   --   --   --   --   FOLATE 12.0  --   --   --   --   --  7.9  --   --   --   --   --   --   FERRITIN  --   --   --   --  359*  --  241  --   --   --   --   --   --   TIBC  --   --   --   --  211*  --  364  --   --   --   --   --   --   IRON  --   --   --   --  64  --  58  --   --   --   --   --   --    < > = values in this interval not displayed.    Cardiac Enzymes: No results for input(s): CKTOTAL, CKMB, CKMBINDEX, TROPONINI in the last 168 hours. CBG: Recent Labs  Lab 01/06/24 2325  GLUCAP 88    Iron Studies: No results for input(s): IRON, TIBC, TRANSFERRIN, FERRITIN in  the last 72 hours. Studies/Results: US  ABDOMEN LIMITED WITH LIVER DOPPLER Result Date: 01/11/2024 CLINICAL DATA:  Hyperbilirubinemia EXAM: DUPLEX ULTRASOUND OF LIVER TECHNIQUE: Color and duplex Doppler ultrasound was performed to evaluate the hepatic in-flow and out-flow vessels. COMPARISON:  CT abdomen pelvis with contrast 01/09/2024 FINDINGS: Liver: Normal parenchymal echogenicity. Mild diffuse nodularity of the hepatic contours. No focal lesion, mass or intrahepatic biliary ductal dilatation. Main Portal Vein size: 1.0 cm Portal Vein Velocities Main Prox:  43 cm/sec Main Mid: 40 cm/sec Main Dist:  45 cm/sec Right: 31 cm/sec Left: 22 cm/sec Hepatic Vein Velocities Right:  85 cm/sec Middle:  94 cm/sec Left:  57 cm/sec IVC: Present and patent with normal respiratory phasicity. Hepatic Artery Velocity:  97 cm/sec Splenic Vein Velocity:  19 cm/sec Spleen: Obscured by shadowing bowel gas. Portal Vein Occlusion/Thrombus: No Splenic Vein Occlusion/Thrombus: No Ascites: None Varices: None IMPRESSION: 1. Patent portal vein with appropriate direction of flow. 2. Nodular hepatic contour suspicious for cirrhosis. No focal hepatic lesion. 3. Unable to evaluate the spleen as it is obscured by overlying shadowing bowel gas. Spleen appears normal on  recent CT from 01/09/2024. 4. Mild nonspecific thickening of the gallbladder wall, most likely a combination of underdistention and chronic hepatitis. Electronically Signed   By: Elester Grim M.D.   On: 01/11/2024 07:55     Medications: Infusions:  anticoagulant sodium citrate      bivalirudin  (ANGIOMAX ) 250 mg in sodium chloride  0.9 % 500 mL (0.5 mg/mL) infusion 0.09 mg/kg/hr (01/12/24 1000)   norepinephrine  (LEVOPHED ) Adult infusion 8 mcg/min (01/12/24 1013)   PrismaSol  BGK 2/3.5 400 mL/hr at 01/12/24 1006   PrismaSol  BGK 2/3.5 400 mL/hr at 01/12/24 0138   prismasol  BGK 4/2.5 2,000 mL/hr at 01/12/24 0906    Scheduled Medications:  Chlorhexidine  Gluconate Cloth  6 each Topical Daily   cinacalcet   30 mg Oral Q M,W,F   Darbepoetin Alfa   150 mcg Intravenous Q7 days   digoxin   0.125 mg Oral Q48H   feeding supplement (NEPRO CARB STEADY)  237 mL Oral BID BM   lactulose   30 g Oral BID   leptospermum manuka honey  1 Application Topical Daily   levothyroxine   25 mcg Oral Q0600   liver oil-zinc  oxide   Topical BID   midodrine   40 mg Oral Q8H   multivitamin  1 tablet Oral QHS   mouth rinse  15 mL Mouth Rinse 4 times per day   polyethylene glycol  17 g Oral Daily   pregabalin   50 mg Oral Daily   senna  1 tablet Oral Daily   ursodiol  300 mg Oral TID   vancomycin  variable dose per unstable renal function (pharmacist dosing)   Does not apply See admin instructions   venlafaxine  XR  37.5 mg Oral Daily   zinc  sulfate (50mg  elemental zinc )  220 mg Oral Daily    have reviewed scheduled and prn medications.  Physical Exam: General:NAD, lying flat, sleepy but arousable Heart:RRR, s1s2 nl Lungs: Bilateral distant breath sound Abdomen:soft, mildly distended Extremities: 1-2+ Peripheral edema Dialysis Access: TDC  Phillip Young 01/12/2024,10:45 AM  LOS: 9 days

## 2024-01-12 NOTE — Progress Notes (Signed)
 PHARMACY - ANTICOAGULATION  Pharmacy Consult for bivalirudin    Indication: atrial fibrillation w/ hx of HIT   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, SRA positive   Metoprolol  Other (See Comments)    near syncope   Other Swelling and Other (See Comments)    Steroids Fluid seeping out of legs Nebulizer(unknown name of neb) elevated pressure    Tramadol      unknown    Patient Measurements: Height: 6' (182.9 cm) Weight: (!) 163 kg (359 lb 5.6 oz) IBW/kg (Calculated) : 77.6 HEPARIN  DW (KG): 125.9  Vital Signs: Temp: 97.5 F (36.4 C) (06/19 0734) Temp Source: Oral (06/19 0734) BP: 89/73 (06/19 0600) Pulse Rate: 108 (06/19 0715)  Labs: Recent Labs    01/11/24 0841 01/11/24 1401 01/11/24 1839 01/11/24 2003 01/12/24 0422 01/12/24 0423  APTT  --  86*  --  90* 87*  --   CREATININE 1.56*  --  1.56*  --   --  1.67*    Estimated Creatinine Clearance: 94.8 mL/min (A) (by C-G formula based on SCr of 1.67 mg/dL (H)).  Assessment: 38 y.o. male with h/o Afib, Eliquis  on hold for CRRT, for argatroban  due to h/o HIT. Remains on CRRT. HgB 9.8 and PLTs 281, aPTT downtrending but remains elevated at 61. No baseline aPTT from this admission, T-bili continues to increase to 20.7, noted yellowing of sclera on exam. Argatroban  off since 6/13 around noon. Unclear if aPTT elevated due to liver dysfunction/congestion, underlying coagulopathy, or due to argatroban . Half life of argatroban  even with liver dysfunction approximately 190 minutes so would anticipate this to be out of patient's system given it has been off since 6/13. T-bili during last admission around 3 with no baseline aPTT reading. INR upon admission 11/2023 was 3.5 INR this admission was 2.9 but noted  on Eliquis  PTA.   aPTT has been stable x 3 readings 61,57, and 56. Suspect this to be patient's baseline. Due to liver impairment will transition to bival while on CRRT. In addition to Afib, patient high risk for PE with weight, low EF, and minimal movement.   aPTT therapeutic at 87 this afternoon while on bival 0.09mg /kghr, no issues noted. HgB 9.8 and PLT 281.   Goal of Therapy:  aPTT: 80-100, 1.5x baseline of approx 55  Monitor platelets by anticoagulation protocol: Yes   Plan:  Continue bival 0.09mg /kg/hr utilizing adjusted body weight.  Monitor for s/sx of bleeding.  Adjust aPTT targets given baseline aPTT is around 55. Target aPTT per protocol is 50-85 however unreliable target with baseline elevation. Will target 1.5x baseline aPTT of 55 after discussion w/ provider.  Will space checks to daily - patient has had 3x therapeutic readings on this regimen.   Mamie Searles, PharmD, BCCCP  Please refer to [facility] for [facility] numbers 01/12/2024 7:39 AM

## 2024-01-12 NOTE — Progress Notes (Addendum)
 Physical Therapy Treatment Patient Details Name: Phillip Young MRN: [id number] DOB: Apr 28, 1986 Today's Date: 01/12/2024   History of Present Illness Pt is 38 year old presented to [facility] on  01/03/24 for SOB, chills, and increased weeping from lt thigh blister. Pt was dc'd from [facility] on same date. Pt with shock apparently predominately from decompensated HF. Pt started on CRRT. Plan to transition back to iHD later in afternoon on 6/19. PMH - ESRD on HD, chronic resp failure, morbid obesity, NICM, OSA, chronic pain, chronic hypotension.    PT Comments  Pt received in supine, drowsy but oriented to self/location/situation, anxious at prospect of mobility and reluctant to participate in any bed level mobility. With heavy encouragement and extensive discussion on need for repositioning for hygiene assist and so RN can address his wounds, pt allows PTA to minimally reposition his limbs and raise HOB slightly (for pt safety when taking bites of ice to reduce risk of aspiration).  Pt with limited participation and very weak voice, whispering but appears to understand reasoning behind PTA request to participate in mobility, but continuing to refuse rolling/repositioning adamantly even when therapist offered to get more staff to assist to reduce his effort needed. Pt continues to benefit from PT services to progress toward functional mobility goals, although progress is limited due to pt decreased motivation with poor pain tolerance. Unclear if pt will be able to progress mobility, recommend continued Palliative services given severity of his wounds and refusing many aspects of care with unstable VS. Vital Signs  Pulse Rate (!) 110  Pulse Rate Source Monitor  Resp 19  BP (!) 67/52 (57) (HOB ~15 degrees)  BP Location Left Wrist  BP Method Automatic  Patient Position (if appropriate) Lying   Vital Signs  Pulse Rate (!) 115-131 bpm  Pulse Rate Source Monitor  Resp 19  BP (!) 82/50 (61) (HOB increased to  25 deg)  BP Location Left Wrist  BP Method Automatic  Patient Position (if appropriate) Lying (after BLE AAROM)     If plan is discharge home, recommend the following: Two people to help with walking and/or transfers;Two people to help with bathing/dressing/bathroom   Can travel by private vehicle        Equipment Recommendations  None recommended by PT (not currently safe to DC home, TBD)    Recommendations for Other Services       Precautions / Restrictions Precautions Precautions: Fall Recall of Precautions/Restrictions: Impaired Precaution/Restrictions Comments: CRRT, many wounds and not allowing staff to assist him with hygiene Restrictions Weight Bearing Restrictions Per Provider Order: No     Mobility  Bed Mobility               General bed mobility comments: pt declines    Transfers                   General transfer comment: pt declines    Ambulation/Gait                   Stairs             Wheelchair Mobility     Tilt Bed    Modified Rankin (Stroke Patients Only)       Balance Overall balance assessment:  (not yet able to assess)  Communication Communication Communication: Impaired Factors Affecting Communication: Difficulty expressing self  Cognition Arousal: Alert Behavior During Therapy: Anxious   PT - Cognitive impairments: Difficult to assess Difficult to assess due to: Impaired communication                     PT - Cognition Comments: Pt with low volume voice and reduced clarity of speech. Pt perseverates on pain and very limiting of what he allows staff to help him with in terms of repositioning and hygiene despite extended discussion on risks of immobility/benefits of mobility. Following commands: Impaired Following commands impaired: Follows one step commands inconsistently    Cueing Cueing Techniques: Verbal cues, Tactile cues,  Gestural cues  Exercises General Exercises - Lower Extremity Ankle Circles/Pumps: AAROM, Both, 5 reps, Supine Straight Leg Raises: AAROM, PROM, Both, 5 reps, Supine, Limitations Straight Leg Raises Limitations: moaning and calling out due to pain Hip Flexion/Marching: PROM, AAROM, 5 reps, Supine (yelling in pain, very limited tolerance)    General Comments General comments (skin integrity, edema, etc.): Pt with dressings on BLE upper thighs anterior/posterior and bed pad under limbs appears damp from wounds/drainage and possible soiling. PTA assisting pt to reposition pillows under his limbs during AAROM but pt very self-limiting and not agreeable to allow PTA and RN to roll him for further hygiene assist or pressure relief. No family present to encourage him either. Pt had been premedicated with IV pain meds and BP very low initially, improves after gentle AAROM and pillow repositioning, even with HOB slightly elevated, but still soft. see comments above for VS. RN present while pt refusing repositioning and hygiene assist. Pt reports his mother will not be here today.      Pertinent Vitals/Pain Pain Assessment Pain Assessment: Faces Faces Pain Scale: Hurts whole lot Pain Location: Bottom, back of thighs and hips from areas of wounds/drainage with any AA/PROM Pain Descriptors / Indicators: Moaning, Guarding, Grimacing, Sharp, Sore Pain Intervention(s): Limited activity within patient's tolerance, Monitored during session, Premedicated before session, Repositioned, Patient requesting pain meds-RN notified (BP very low, pt very limited participation in repositioning)    Home Living                          Prior Function            PT Goals (current goals can now be found in the care plan section) Acute Rehab PT Goals Patient Stated Goal: decr pain PT Goal Formulation: With patient Time For Goal Achievement: 01/21/24 Progress towards PT goals: Not progressing toward goals -  comment    Frequency    Min 1X/week      PT Plan      Co-evaluation              AM-PAC PT 6 Clicks Mobility   Outcome Measure  Help needed turning from your back to your side while in a flat bed without using bedrails?: Total (not currently tolerating) Help needed moving from lying on your back to sitting on the side of a flat bed without using bedrails?: Total Help needed moving to and from a bed to a chair (including a wheelchair)?: Total Help needed standing up from a chair using your arms (e.g., wheelchair or bedside chair)?: Total Help needed to walk in hospital room?: Total Help needed climbing 3-5 steps with a railing? : Total 6 Click Score: 6    End of Session Equipment Utilized During Treatment: Oxygen   Activity Tolerance: Patient limited by pain;Treatment limited secondary to medical complications (Comment);Other (comment) (low BP, pt self-limiting and refusing despite encouragement) Patient left: in bed;with call bell/phone within reach;with bed alarm set;Other (comment) (pt refusing SCD/pillows behind lower back/hip to offload pressure) Nurse Communication: Mobility status;Need for lift equipment;Patient requests pain meds;Precautions;Other (comment) (would be better to have +4 staff for wound care/hygiene assist.) PT Visit Diagnosis: Other abnormalities of gait and mobility (R26.89);Muscle weakness (generalized) (M62.81);Difficulty in walking, not elsewhere classified (R26.2);Pain Pain - part of body: Leg (bottom/groin where wounds are)     Time: 1450-1510 PT Time Calculation (min) (ACUTE ONLY): 20 min  Charges:    $Therapeutic Activity: 8-22 mins PT General Charges $$ ACUTE PT VISIT: 1 Visit                     Conswella Bruney P., PTA Acute Rehabilitation Services Secure Chat Preferred 9a-5:30pm Office: [phone]    Mariel Shope [facility] 01/12/2024, 4:29 PM

## 2024-01-12 NOTE — Progress Notes (Signed)
 Since change of shift, pt has been refusing mobility as well as dressing changes on his medial thighs. Pt is also refusing to turn on side to allow for wound/back assessment. Pt had previously agreed to have dressings changed after 2200 meds but is now stating lets do them later. Re-educated pt on both the importance of, and the reason behind, dressing changes. Will continue to encourage pt participation in care and re-educate as needed.

## 2024-01-12 NOTE — TOC Progression Note (Signed)
 Transition of Care [facility]) - Progression Note    Patient Details  Name: Phillip Young MRN: [id number] Date of Birth: 1985-08-01  [facility] CM/SW Contact  Juliane Och, LCSW Phone Number: 01/12/2024, 9:15 AM  Clinical Narrative:     9:15 AM Per chart review, GOC meeting was conducted yesterday. Follow up GOC meeting is to occur next Wednesday, June 25th. Palliative Care team continues to follow.  Expected Discharge Plan: Long Term Acute Care (LTAC) Barriers to Discharge: Continued Medical Work up  Expected Discharge Plan and Services       Living arrangements for the past 2 months: Apartment                             HH Agency: [facility]         Social Determinants of Health (SDOH) Interventions SDOH Screenings   Food Insecurity: No Food Insecurity (01/08/2024)  Recent Concern: Food Insecurity - Food Insecurity Present (11/01/2023)  Housing: High Risk (01/08/2024)  Transportation Needs: Unmet Transportation Needs (01/08/2024)  Utilities: At Risk (01/08/2024)  Alcohol  Screen: Low Risk  (10/06/2023)  Depression (PHQ2-9): High Risk (10/06/2023)  Financial Resource Strain: Low Risk  (10/06/2023)  Physical Activity: Insufficiently Active (10/06/2023)  Social Connections: Socially Isolated (10/06/2023)  Stress: No Stress Concern Present (10/06/2023)  Tobacco Use: Medium Risk (12/14/2023)  Health Literacy: Adequate Health Literacy (10/06/2023)    Readmission Risk Interventions    12/15/2023    2:07 PM 10/13/2023   11:27 AM 09/23/2023    5:05 PM  Readmission Risk Prevention Plan  Transportation Screening Complete Complete Complete  Medication Review (RN Care Manager) Referral to Pharmacy Referral to Pharmacy Referral to Pharmacy  PCP or Specialist appointment within 3-5 days of discharge Complete  Complete  HRI or Home Care Consult Complete Complete Complete  SW Recovery Care/Counseling Consult Complete Complete Complete  Palliative  Care Screening Not Applicable Not Applicable Not Applicable  Skilled Nursing Facility Not Applicable Not Applicable Not Applicable

## 2024-01-13 DIAGNOSIS — Z992 Dependence on renal dialysis: Secondary | ICD-10-CM | POA: Diagnosis not present

## 2024-01-13 DIAGNOSIS — N186 End stage renal disease: Secondary | ICD-10-CM | POA: Diagnosis not present

## 2024-01-13 DIAGNOSIS — E7220 Disorder of urea cycle metabolism, unspecified: Secondary | ICD-10-CM | POA: Diagnosis not present

## 2024-01-13 DIAGNOSIS — R579 Shock, unspecified: Secondary | ICD-10-CM | POA: Diagnosis not present

## 2024-01-13 LAB — CBC
HCT: 30.6 % — ABNORMAL LOW (ref 39.0–52.0)
Hemoglobin: 9.9 g/dL — ABNORMAL LOW (ref 13.0–17.0)
MCH: 29.6 pg (ref 26.0–34.0)
MCHC: 32.4 g/dL (ref 30.0–36.0)
MCV: 91.6 fL (ref 80.0–100.0)
Platelets: 195 10*3/uL (ref 150–400)
RBC: 3.34 MIL/uL — ABNORMAL LOW (ref 4.22–5.81)
RDW: 21.4 % — ABNORMAL HIGH (ref 11.5–15.5)
WBC: 21.1 10*3/uL — ABNORMAL HIGH (ref 4.0–10.5)
nRBC: 0 % (ref 0.0–0.2)

## 2024-01-13 LAB — RENAL FUNCTION PANEL
Albumin: 1.6 g/dL — ABNORMAL LOW (ref 3.5–5.0)
Albumin: 1.6 g/dL — ABNORMAL LOW (ref 3.5–5.0)
Anion gap: 13 (ref 5–15)
Anion gap: 16 — ABNORMAL HIGH (ref 5–15)
BUN: 28 mg/dL — ABNORMAL HIGH (ref 6–20)
BUN: 38 mg/dL — ABNORMAL HIGH (ref 6–20)
CO2: 21 mmol/L — ABNORMAL LOW (ref 22–32)
CO2: 17 mmol/L — ABNORMAL LOW (ref 22–32)
Calcium: 9.1 mg/dL (ref 8.9–10.3)
Calcium: 8.6 mg/dL — ABNORMAL LOW (ref 8.9–10.3)
Chloride: 93 mmol/L — ABNORMAL LOW (ref 98–111)
Chloride: 93 mmol/L — ABNORMAL LOW (ref 98–111)
Creatinine, Ser: 2.48 mg/dL — ABNORMAL HIGH (ref 0.61–1.24)
Creatinine, Ser: 3.16 mg/dL — ABNORMAL HIGH (ref 0.61–1.24)
GFR, Estimated: 33 mL/min — ABNORMAL LOW (ref >60)
GFR, Estimated: 25 mL/min — ABNORMAL LOW (ref >60)
Glucose, Bld: 84 mg/dL (ref 70–99)
Glucose, Bld: 83 mg/dL (ref 70–99)
Phosphorus: 3.4 mg/dL (ref 2.5–4.6)
Phosphorus: 3.7 mg/dL (ref 2.5–4.6)
Potassium: 4.5 mmol/L (ref 3.5–5.1)
Potassium: 6.2 mmol/L — ABNORMAL HIGH (ref 3.5–5.1)
Sodium: 127 mmol/L — ABNORMAL LOW (ref 135–145)
Sodium: 126 mmol/L — ABNORMAL LOW (ref 135–145)

## 2024-01-13 LAB — APTT
aPTT: 99 s — ABNORMAL HIGH (ref 24–36)
aPTT: 99 s — ABNORMAL HIGH (ref 24–36)

## 2024-01-13 LAB — HEPATIC FUNCTION PANEL
ALT: 38 U/L (ref 0–44)
AST: 61 U/L — ABNORMAL HIGH (ref 15–41)
Albumin: 1.6 g/dL — ABNORMAL LOW (ref 3.5–5.0)
Alkaline Phosphatase: 254 U/L — ABNORMAL HIGH (ref 38–126)
Bilirubin, Direct: 15.2 mg/dL — ABNORMAL HIGH (ref 0.0–0.2)
Indirect Bilirubin: 8.6 mg/dL — ABNORMAL HIGH (ref 0.3–0.9)
Total Bilirubin: 23.8 mg/dL (ref 0.0–1.2)
Total Protein: 7.2 g/dL (ref 6.5–8.1)

## 2024-01-13 LAB — VANCOMYCIN, RANDOM: Vancomycin Rm: 31 ug/mL

## 2024-01-13 LAB — MAGNESIUM: Magnesium: 2.1 mg/dL (ref 1.7–2.4)

## 2024-01-13 LAB — HEPATITIS B SURFACE ANTIGEN: Hepatitis B Surface Ag: NONREACTIVE

## 2024-01-13 MED ORDER — OXYCODONE HCL 5 MG PO TABS
5.0000 mg | ORAL_TABLET | Freq: Once | ORAL | Status: AC
  Administered 2024-01-13: 5 mg via ORAL
  Filled 2024-01-13: qty 1

## 2024-01-13 MED ORDER — HYDROMORPHONE HCL 1 MG/ML IJ SOLN
0.5000 mg | INTRAMUSCULAR | Status: AC | PRN
  Administered 2024-01-13 – 2024-01-14 (×2): 0.5 mg via INTRAVENOUS
  Filled 2024-01-13 (×2): qty 1

## 2024-01-13 NOTE — Progress Notes (Signed)
 NAME:  Phillip Young, MRN:  [id number], DOB:  1985/10/31, LOS: 10 ADMISSION DATE:  01/03/2024, CONSULTATION DATE:  01/03/24 REFERRING MD:  Dr. Val Garin, CHIEF COMPLAINT:  SOB/ chills   History of Present Illness:  7 yoM w/PMH as below significant for biventricular HF (EF < 20%), chronic afib on Eliquis , chronic hypotension on midodrine  30mg  TID, ESRD, chronic hypoxic and hypercarbic respiratory failure on NIV, morbid obesity, medical non-compliance, and chronic pain presenting from home after being discharged from [facility] 6/9 with SOB, chills, subjective fever, ongoing poor PO, vomiting today strings of blood but then reported it was with coughing episodes, pain all over, and increased weeping from left thigh blister.  Had BM, unclear on consistency/ color.  Reports last dialysis yesterday.  Initially reports not taking any PO meds today but then said he did this morning.  Non- ambulatory at baseline.  Frequent admits, most recently 5/13-5/22 for recurrent septic shock and MRSE bacteremia due to suspected line complicated by acute on chronic HF and respiratory failure, suspected IE but not a candidate for TEE, discharged on vancomycin  with HD scheduled thru 6/30.  TDC was replaced 5/22 after 2 day line holiday.  Also started on micafungin  w/HD at [facility] after [facility] grew candida species, scheduled thru 6/30.   In ER, temp 99.1, on HFNC at 6L, with SBP into 70's requiring NE support after 500 ml bolus.  Labs significant for Na 129, K 6.3, Cl 89, alk phos 346, AST 84, t.bili 12 (recent 3.4 on 5/14), lactic 3.8, WBC 20.6 (previously 8.1 on 5/22), INR 2.9, CXR showing vascular congestion and increasing congestion and pleural effusions.  BC sent and empirically started on linezolid , flagyl , and cefepime .  Pending CT chest/abd/ pelvis.  Nephrology consulted.  PCCM called for admit.   Pertinent  Medical History   Past Medical History:  Diagnosis Date   Acute on chronic respiratory failure with hypoxia ([name])  04/21/2021   Acute on chronic systolic (congestive) heart failure ([name]) 02/26/2020   Amiodarone  toxicity    Anemia    Biventricular congestive heart failure ([name])    Candida parapsilosis infection 12/26/2023   Chronic hypoxemic respiratory failure ([name])    Class 3 severe obesity due to excess calories with serious comorbidity and body mass index (BMI) of 50.0 to 59.9 in adult 02/26/2020   ESRD on hemodialysis [facility])    Essential hypertension 02/26/2020   GERD without esophagitis 02/26/2020   Hidradenitis suppurativa 02/26/2020   NICM (nonischemic cardiomyopathy) ([name])    Obesity hypoventilation syndrome ([name])    OSA (obstructive sleep apnea)    Pneumonia    Prediabetes 02/26/2020   Significant Hospital Events: Including procedures, antibiotic start and stop dates in addition to other pertinent events   6/10 Admit 6/12 Remains on vasopressors.  Spoke to heart failure.  Not a candidate for advanced therapies.  Not a candidate for tailored hemodynamics.  Recommend a strategy of vasopressor support at fluid removal in an attempt to transition back to IHD. 6/15 patient brought up discussion of limiting some of his care; Palliative re-consulted.  6/16 palliative consulted per patient's request. CT unrevealing for cause of abdominal pain.  Interim History / Subjective:  Patient continued to require vasopressor support with Levophed  currently at 23 mics Per nephrology patient will not tolerate IHD and he is not a candidate for CRRT at this point He is complaining of pain all over He has been refusing to change position in the bed  Objective    Blood pressure (!) 62/46,  pulse (!) 109, temperature 98 F (36.7 C), resp. rate (!) 28, height 6' (1.829 m), weight (!) 159.4 kg, SpO2 100%.        Intake/Output Summary (Last 24 hours) at 01/13/2024 1013 Last data filed at 01/13/2024 0600 Gross per 24 hour  Intake 2709.87 ml  Output 2047 ml  Net 662.87 ml   Filed Weights   01/08/24 0500  01/09/24 0305 01/13/24 0500  Weight: (!) 164.7 kg (!) 163 kg (!) 159.4 kg   Examination: General: [name], lying on the bed HEENT: [place]/AT, eyes anicteric.  moist mucus membranes Neuro: Alert, awake following commands, generalized weak Chest: Coarse breath sounds, no wheezes or rhonchi Heart: Irregularly irregular, no murmurs or gallops Abdomen: Soft, nontender, nondistended, bowel sounds present   I/O +422 in last 24 hours  Na+  127 BUN 28 Cr 2.48 AST 61 ALT 38 T bili 23.8, both indirect & direct both elevated WBC 21.1 Hb 9.9  RUQ US  with doppler: patent portal veins, likely cirrhosis, underdistended GB with mild wall thickening   Resolved problem list:   Abdominal pain; worry this is related to his constipation Acute metabolic encephalopathy; suspect pain meds may be contributing to this some  Assessment and Plan  Shock ([name])> suspect primarily cardiogenic; sepsis also likely with leukocytosis, but no source has been identified Continue vasopressor support with Levophed , currently on 12 mics with MAP goal 60 Continue high-dose midodrine  at 40 mg 3 times daily Continue IV vancomycin  to treat cellulitis Not a candidate for advanced heart failure therapy  Hyperbilirubinemia-- congestive vs low forward flow state. 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   Imaging has been inconsistent, but possible cirrhosis on recent RUQ US > his picture would fit for cardiac cirrhosis. No signs of obstruction.  Acaculous cholecystitis is possible> no suggestion of this on any of his imaging. Patent hepatic portal veins so no Budd Chiari. Low suspicion for DILI with normal transaminases.  Continue trial of ursodiol  Acute on chronic biventricular congestive heart failure ([name])-combined sysolic & diastolic; known NICM. Echo shows severe RV and LV failure with EF <20%. Non-compliant for many years. Per advanced heart failure, patient is not a candidate for advanced  therapy Unable to tolerate GDMT in the setting of hypotension CRRT was stopped, still requiring vasopressor support, would not tolerate IHD If he is unable to wean off pressors, hospice care will be the only reasonable option.  Right now I think it is highly unlikely he will be able to resume iHD with his current status. Palliative care reconsulted at the patient's request earlier this week.  He remains at high risk for decompensation this admission.   ESRD on hemodialysis CRRT was stopped yesterday Per nephrology will not be able to tolerate IHD considering still requiring 12 mics of Levophed  Patient mother was updated  Constipation-- no bowel movement the since entire admission He has been refusing his bowel regimen more often than not Continue to offer bowel regimen  Obesity hypoventilation syndrome - Intermittently compliant with home BIPAP. Recommend at bedtime bipap; hasn't been using  Permanent atrial fibrillation  Amiodarone  intolerant Heart rate is controlled Continue bival on CRRT Due to hyperbilirubinemia, can switch him to Eliquis  yet Continue digoxin  every 48 hours with.  Rec level checks   Chronic pain disorder Continue pregabalin  Stop oxycodone  and Dilaudid  considering low blood pressure  Mild hyperammonemia Lactulose  BID; he has been refusing  Skin ulceration; recent cellulitis  Has chronic skin ulceration, and he routinely has been refusing dressing changes.  He understands he will not be able to make progress if we cannot keep his wounds clean. On examination: multiple areas of covered wound but no drainage or discoloration.  On Vanc- dosing per pharmacy   Long term prognosis remains grim. No good options if he is not able to tolerate iHD. Appreciate Palliative care team's management.    Best Practice (right click and Reselect all SmartList Selections daily)   Diet/type: Regular consistency (see orders) DVT prophylaxis SCD, bival gtt Pressure ulcer(s):  present on admission  GI prophylaxis: PPI Lines: Dialysis Catheter-pta R TDC (12/15/23) Foley:  N/A Code Status:  full code Last date of multidisciplinary goals of care discussion - 6/17 with PMT   The patient is critically ill due to cardiogenic shock.  Critical care was necessary to treat or prevent imminent or life-threatening deterioration.  Critical care was time spent personally by me on the following activities: development of treatment plan with patient and/or surrogate as well as nursing, discussions with consultants, evaluation of patient's response to treatment, examination of patient, obtaining history from patient or surrogate, ordering and performing treatments and interventions, ordering and review of laboratory studies, ordering and review of radiographic studies, pulse oximetry, re-evaluation of patient's condition and participation in multidisciplinary rounds.   During this encounter critical care time was devoted to patient care services described in this note for 40 minutes.     Trevor Fudge, MD [facility] See Amion for pager If no response to pager, please call [phone] until 7pm After 7pm, Please call E-link [phone]

## 2024-01-13 NOTE — Progress Notes (Signed)
 [facility] NEPHROLOGY PROGRESS NOTE  Assessment/ Plan: Pt is a 38 y.o. yo male with past medical history of biventricular heart failure EF less than 20, chronic A-fib, ESRD on HD, chronic hypotension on high-dose midodrine , nonadherence with outpatient HD and treatment, recently at [facility] presented with shock and volume overload.  # Shock, cardiogenic:  12/06/23 EF < 20%. Sepsis less likely with neg cultures, abx off. Managing UF with HD.   # ESRD:  on HD since 01/2021.  Was [facility] MWF, noncompliant with outpatient HD with chronic hyperkalemia.  CRRT paused 01/12/24  # Anemia of ESRD: Dosed Aranesp .  Transfuse as needed.  # Secondary hyperparathyroidism/hyperphosphatemia: Currently on CRRT.  Follow labs.  # Chronic hypotension/volume: On Levophed  and midodrine  for shock and receiving midodrine  to facilitate UF.  # a fib: usually rate controlled, this AM low 100s. Decrease UF rate today.   #HIT: avoiding heparin   #Elevated Tbili: with synthetic dysfunction  GOC:  pt with poor prognosis and at times even refusing basic care, not abiding by fluid restriction.  He requested a pall care c/s- greatly appreciate their expertise.  We have paused CRRT 01/12/24.  Hemodynamics have not improved to be in a spot to offer iHD today.  Putting him back on CRRT is not a step forward out of the ICU.  I think we may be coming to an impasse and nearly are at the limits of what we can offer him.    Subjective: CRRT stopped yesterday.  Pressures are still soft.  Hemodynamics are not favorable for starting IHD at all.  Discussed with pt.  He is requesting pain meds.  Objective Vital signs in last 24 hours: Vitals:   01/13/24 0611 01/13/24 0700 01/13/24 0800 01/13/24 1036  BP: (!) 88/69 (!) 62/46    Pulse: (!) 107 (!) 109  (!) 119  Resp: 14 (!) 28    Temp:   98 F (36.7 C)   TempSrc:      SpO2: 100% 100%    Weight:      Height:       Weight change:   Intake/Output Summary (Last 24 hours)  at 01/13/2024 1127 Last data filed at 01/13/2024 0600 Gross per 24 hour  Intake 2421.46 ml  Output 1747 ml  Net 674.46 ml       Labs: RENAL PANEL Recent Labs  Lab 01/09/24 0236 01/09/24 1708 01/10/24 0549 01/10/24 1208 01/11/24 0840 01/11/24 0841 01/11/24 1839 01/12/24 0422 01/12/24 0423 01/12/24 1641 01/13/24 0426  NA 132*   < > 131*   < >  --  133* 130*  --  130* 129* 127*  K 4.0   < > 4.0   < >  --  4.2 4.3  --  4.2 4.3 4.5  CL 99   < > 100   < >  --  99 99  --  97* 95* 93*  CO2 22   < > 23   < >  --  21* 21*  --  20* 20* 21*  GLUCOSE 101*   < > 88   < >  --  83 86  --  95 98 84  BUN 16   < > 17   < >  --  14 16  --  17 17 28*  CREATININE 1.65*   < > 1.42*   < >  --  1.56* 1.56*  --  1.67* 1.81* 2.48*  CALCIUM  9.2   < > 8.6*   < >  --  8.9 8.7*  --  9.0 9.2 9.1  MG 2.3  --  2.1  --  2.1  --   --  2.3  --   --  2.1  PHOS 2.5   < > 2.7   < >  --  2.8 2.9  --  2.8 3.1 3.4  ALBUMIN  1.7*   < > 1.6*   < > 1.6* 1.6* 1.6* 1.7* 1.7* 1.6* 1.6*  1.6*   < > = values in this interval not displayed.    Liver Function Tests: Recent Labs  Lab 01/11/24 0840 01/11/24 0841 01/12/24 0422 01/12/24 0423 01/12/24 1641 01/13/24 0426  AST 60*  --  66*  --   --  61*  ALT 33  --  36  --   --  38  ALKPHOS 283*  --  298*  --   --  254*  BILITOT 22.2*  --  24.6*  --   --  23.8*  PROT 6.8  --  7.4  --   --  7.2  ALBUMIN  1.6*   < > 1.7* 1.7* 1.6* 1.6*  1.6*   < > = values in this interval not displayed.   No results for input(s): LIPASE, AMYLASE in the last 168 hours. Recent Labs  Lab 01/09/24 1059  AMMONIA 44*   CBC: Recent Labs    02/21/23 1538 02/22/23 0209 04/14/23 0314 04/15/23 1054 05/19/23 2020 05/20/23 1422 08/31/23 0348 09/01/23 0343 01/06/24 0442 01/07/24 0246 01/08/24 0451 01/09/24 0236 01/13/24 0426  HGB  --    < >  --    < >  --    < > 10.3*   < > 9.3* 9.3* 9.3* 9.8* 9.9*  MCV  --    < >  --    < >  --    < > 100.6*   < > 91.1 90.7 91.7 91.2 91.6   VITAMINB12  --   --  1,014*  --   --   --  815  --   --   --   --   --   --   FOLATE 12.0  --   --   --   --   --  7.9  --   --   --   --   --   --   FERRITIN  --   --   --   --  359*  --  241  --   --   --   --   --   --   TIBC  --   --   --   --  211*  --  364  --   --   --   --   --   --   IRON  --   --   --   --  64  --  58  --   --   --   --   --   --    < > = values in this interval not displayed.    Cardiac Enzymes: No results for input(s): CKTOTAL, CKMB, CKMBINDEX, TROPONINI in the last 168 hours. CBG: Recent Labs  Lab 01/06/24 2325  GLUCAP 88    Iron Studies: No results for input(s): IRON, TIBC, TRANSFERRIN, FERRITIN in the last 72 hours. Studies/Results: No results found.    Medications: Infusions:  anticoagulant sodium citrate      bivalirudin  (ANGIOMAX ) 250 mg in sodium chloride  0.9 % 500 mL (0.5 mg/mL) infusion  0.08 mg/kg/hr (01/13/24 1038)   norepinephrine  (LEVOPHED ) Adult infusion 10 mcg/min (01/13/24 0649)    Scheduled Medications:  Chlorhexidine  Gluconate Cloth  6 each Topical Daily   cinacalcet   30 mg Oral Q M,W,F   Darbepoetin Alfa   150 mcg Intravenous Q7 days   digoxin   0.125 mg Oral Q48H   feeding supplement (NEPRO CARB STEADY)  237 mL Oral BID BM   lactulose   30 g Oral BID   leptospermum manuka honey  1 Application Topical Daily   levothyroxine   25 mcg Oral Q0600   liver oil-zinc  oxide   Topical BID   midodrine   40 mg Oral Q8H   multivitamin  1 tablet Oral QHS   mouth rinse  15 mL Mouth Rinse 4 times per day   polyethylene glycol  17 g Oral Daily   pregabalin   50 mg Oral Daily   senna  1 tablet Oral Daily   ursodiol  300 mg Oral TID   vancomycin  variable dose per unstable renal function (pharmacist dosing)   Does not apply See admin instructions   venlafaxine  XR  37.5 mg Oral Daily   zinc  sulfate (50mg  elemental zinc )  220 mg Oral Daily    have reviewed scheduled and prn medications.  Physical Exam: General:NAD, lying flat,  sleepy but arousable Heart:RRR, s1s2 nl Lungs: Bilateral distant breath sound Abdomen:soft, mildly distended Extremities: 1-2+ Peripheral edema Dialysis Access: TDC  Cassara Nida 01/13/2024,11:27 AM  LOS: 10 days

## 2024-01-13 NOTE — Progress Notes (Addendum)
 PHARMACY - ANTICOAGULATION  Pharmacy Consult for bivalirudin    Indication: atrial fibrillation w/ hx of HIT   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, SRA positive   Metoprolol  Other (See Comments)    near syncope   Other Swelling and Other (See Comments)    Steroids Fluid seeping out of legs Nebulizer(unknown name of neb) elevated pressure    Tramadol      unknown    Patient Measurements: Height: 6' (182.9 cm) Weight: (!) 159.4 kg (351 lb 6.6 oz) IBW/kg (Calculated) : 77.6 HEPARIN  DW (KG): 125.9  Vital Signs: Temp: 98 F (36.7 C) (06/20 0800) Temp Source: Oral (06/20 0409) BP: 62/46 (06/20 0700) Pulse Rate: 109 (06/20 0700)  Labs: Recent Labs    01/11/24 2003 01/12/24 0422 01/12/24 0423 01/12/24 1641 01/13/24 0426  HGB  --   --   --   --  9.9*  HCT  --   --   --   --  30.6*  PLT  --   --   --   --  195  APTT 90* 87*  --   --  99*  CREATININE  --   --  1.67* 1.81* 2.48*    Estimated Creatinine Clearance: 63 mL/min (A) (by C-G formula based on SCr of 2.48 mg/dL (H)).  Assessment: 38 y.o. male with h/o Afib, Eliquis  on hold for CRRT, for argatroban  due to h/o HIT. Remains on CRRT. HgB 9.8 and PLTs 281, aPTT downtrending but remains elevated at 61. No baseline aPTT from this admission, T-bili continues to increase to 20.7, noted yellowing of sclera on exam. Argatroban  off since 6/13 around noon. Unclear if aPTT elevated due to liver dysfunction/congestion, underlying coagulopathy, or due to argatroban . Half life of argatroban  even with liver dysfunction approximately 190 minutes so would anticipate this to be out of patient's system given it has been off since 6/13. T-bili during last admission around 3 with no baseline aPTT reading. INR  upon admission 11/2023 was 3.5 INR this admission was 2.9 but noted on Eliquis  PTA.   aPTT has been stable x 3 readings 61,57, and 56. Suspect this to be patient's baseline. Due to liver impairment will transition to bival while on CRRT. In addition to Afib, patient high risk for PE with weight, low EF, and minimal movement.   Off of CRRT since 6/19 PM - however will continue to hold DOAC given rising T-bili and ongoing liver failure suspected due to congestion from known end stage HF. aPTT 99 this AM, suspect this is due to CRRT stopping.   Goal of Therapy:  aPTT: 80-100, 1.5x baseline of approx 55  Monitor platelets by anticoagulation protocol: Yes   Plan:  Reduce bival 0.080mg /kg/hr utilizing adjusted body weight.  Monitor for s/sx of bleeding.  Adjust aPTT targets given baseline aPTT is around 55. Target aPTT per protocol is 50-85 however unreliable target with baseline elevation. Will target 1.5x baseline aPTT of 55 after discussion w/ provider.  Will check aPTT in 4 hours.   Mamie Searles, PharmD, BCCCP  Please refer to [facility] for [facility] numbers 01/13/2024 10:17 AM

## 2024-01-13 NOTE — Progress Notes (Addendum)
 PHARMACY - ANTICOAGULATION  Pharmacy Consult for bivalirudin    Indication: atrial fibrillation w/ hx of HIT   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, SRA positive   Metoprolol  Other (See Comments)    near syncope   Other Swelling and Other (See Comments)    Steroids Fluid seeping out of legs Nebulizer(unknown name of neb) elevated pressure    Tramadol      unknown    Patient Measurements: Height: 6' (182.9 cm) Weight: (!) 159.4 kg (351 lb 6.6 oz) IBW/kg (Calculated) : 77.6 HEPARIN  DW (KG): 125.9  Vital Signs: Temp: 98.2 F (36.8 C) (06/20 1200) Temp Source: Oral (06/20 0409) BP: 84/64 (06/20 1200) Pulse Rate: 100 (06/20 1215)  Labs: Recent Labs    01/12/24 0422 01/12/24 0423 01/12/24 1641 01/13/24 0426 01/13/24 1442  HGB  --   --   --  9.9*  --   HCT  --   --   --  30.6*  --   PLT  --   --   --  195  --   APTT 87*  --   --  99* 99*  CREATININE  --  1.67* 1.81* 2.48*  --     Estimated Creatinine Clearance: 63 mL/min (A) (by C-G formula based on SCr of 2.48 mg/dL (H)).  Assessment: 38 y.o. male with h/o Afib, Eliquis  on hold for CRRT, for argatroban  due to h/o HIT. Remains on CRRT. HgB 9.8 and PLTs 281, aPTT downtrending but remains elevated at 61. No baseline aPTT from this admission, T-bili continues to increase to 20.7, noted yellowing of sclera on exam. Argatroban  off since 6/13 around noon. Unclear if aPTT elevated due to liver dysfunction/congestion, underlying coagulopathy, or due to argatroban . Half life of argatroban  even with liver dysfunction approximately 190 minutes so would anticipate this to be out of patient's system given it has been off since 6/13. T-bili during last admission around 3 with no baseline aPTT reading.  INR upon admission 11/2023 was 3.5 INR this admission was 2.9 but noted on Eliquis  PTA.   aPTT has been stable x 3 readings 61,57, and 56. Suspect this to be patient's baseline. Due to liver impairment will transition to bival while on CRRT. In addition to Afib, patient high risk for PE with weight, low EF, and minimal movement.   Off of CRRT since 6/19 PM - however will continue to hold DOAC given rising T-bili and ongoing liver failure suspected due to congestion from known end stage HF.   aPTT remains 99 (at top of therapeutic range). No bleeding noted.  Goal of Therapy:  aPTT: 80-100, 1.5x baseline of approx 55  Monitor platelets by anticoagulation protocol: Yes   Plan:  Reduce bivalirudin  slightly to 0.075 mg/kg/hr utilizing adjusted body weight.  Monitor for s/sx of bleeding.  Adjust aPTT targets given baseline aPTT is around 55. Target aPTT per protocol is 50-85 however unreliable target with baseline elevation. Will target 1.5x baseline aPTT of 55 after discussion w/ provider.  Will check aPTT in ~4 hours.   Enrigue Harvard, PharmD, BCPS Please see amion for complete clinical pharmacist phone list 01/13/2024 3:37 PM   ADDENDUM (2140) Noted that pt refusing labs this p.m. Okay to get aPTT in a.m.  Enrigue Harvard, PharmD,  BCPS Please see amion for complete clinical pharmacist phone list 01/13/2024 9:37 PM

## 2024-01-13 NOTE — Progress Notes (Signed)
 PT Cancellation Note  Patient Details Name: Phillip Young MRN: [id number] DOB: 1986-03-26   Cancelled Treatment:    Reason Eval/Treat Not Completed: (P) Patient declined, no reason specified (per RN, pt refusing repositioning/care and defers PT session this date.)  Recommend palliative and possibly chaplain services continue to follow patient if indicated given pt refusal of interventions. Will continue efforts per PT plan of care as schedule permits if pt agreeable to participate.  Mariel Shope Amalia Edgecombe 01/13/2024, 10:38 AM

## 2024-01-13 NOTE — Progress Notes (Signed)
 Pt continues to remove pulse ox, and move arm while manuel Bp are being done. Will continue to educate pt on the importance of compliance.

## 2024-01-13 NOTE — Progress Notes (Signed)
 Pharmacy Antibiotic Note  Phillip Young is a 38 y.o. male admitted on 01/03/2024 with MRSE presumed endocarditis.  Pharmacy has been consulted for vancomycin  dosing. Patient ESRD, has been on CRRT for approximately 1 week due to hypotension. CRRT stopped 6/19 around 17:30. Was planning for potential trial of iHD today so obtained VR which resulted at 31. However due to [place] requirements, no iHD trial today and will continue to hold CRRT.   Plan: Hold vancomycin  for now.  F/u nephrology plan for further dialysis and need for additional random levels.   Height: 6' (182.9 cm) Weight: (!) 159.4 kg (351 lb 6.6 oz) IBW/kg (Calculated) : 77.6  Temp (24hrs), Avg:97.9 F (36.6 C), Min:97.6 F (36.4 C), Max:98.9 F (37.2 C)  Recent Labs  Lab 01/07/24 0246 01/07/24 0252 01/08/24 0451 01/08/24 0453 01/08/24 1035 01/08/24 1624 01/09/24 0236 01/09/24 1708 01/11/24 0841 01/11/24 1839 01/11/24 2143 01/12/24 0423 01/12/24 1641 01/13/24 0426  WBC 17.7*  --  19.3*  --   --   --  21.3*  --   --   --   --   --   --  21.1*  CREATININE  --    < > 1.70*   < >  --    < > 1.65*   < > 1.56* 1.56*  --  1.67* 1.81* 2.48*  VANCOTROUGH  --   --   --   --  26*  --   --   --   --   --  22*  --   --   --   VANCORANDOM  --   --   --   --   --   --   --   --   --   --   --   --   --  31   < > = values in this interval not displayed.    Estimated Creatinine Clearance: 63 mL/min (A) (by C-G formula based on SCr of 2.48 mg/dL (H)).    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, SRA positive   Metoprolol  Other (See Comments)    near syncope   Other Swelling and Other (See Comments)    Steroids Fluid seeping out of legs Nebulizer(unknown name of neb) elevated pressure     Tramadol      unknown    Antimicrobials this admission: Vancomycin  5/14 >> EOT 6/30  Microbiology results: 6/10 BCx: NGTD x 5d  6/10 MRSA PCR: negative   Thank you for allowing pharmacy to be a part of this patient's care.  Mamie Searles, PharmD, BCCCP  01/13/2024 8:46 AM

## 2024-01-13 NOTE — Progress Notes (Addendum)
 eLink Physician-Brief Progress Note Patient Name: Phillip Young DOB: [birth year] MRN: [id number]   Date of Service  01/13/2024  HPI/Events of Note  Pt refusing lab draw for aPTT, BSRN attempted to talk them into it.   Discussed with RN.  Looks like he is depressed. Earlier refusing bed side cleaning care.  On angiomax , last potassium > 6 from evening, had his HD earlier today.   eICU Interventions  aPTT, BMP ordered for 9 PM.       Intervention Category Minor Interventions: Other:  Rexann Catalan 01/13/2024, 7:50 PM  21:37 he would like his pain meds restarted and decrease Levo @ 12  Camera: Morbidly obese, on nasal o2, MAP 67. HR 106. Sats ok. Looks depressed. Refusing plan of care on and off.  Discussed with RN. - was getting dilaudid  IV and OxyContin , that was stopped due to low MAP. Ordered oxy IR 5 mg once for now, I am bit hesitant to give higher dose of narcotics due to risk for intubation.  - consider psych eval for depression? In AM.  23:28 Still c/o of pain 10/10. IV dilaudid  ordered once.

## 2024-01-13 NOTE — Plan of Care (Signed)
  Problem: Education: Goal: Knowledge of General Education information will improve Description: Including pain rating scale, medication(s)/side effects and non-pharmacologic comfort measures Outcome: Progressing   Problem: Clinical Measurements: Goal: Respiratory complications will improve Outcome: Progressing   Problem: Nutrition: Goal: Adequate nutrition will be maintained Outcome: Progressing   Problem: Elimination: Goal: Will not experience complications related to bowel motility Outcome: Progressing

## 2024-01-13 NOTE — Progress Notes (Signed)
 Pt refused peri care and under sheets are soiled

## 2024-01-14 DIAGNOSIS — I4821 Permanent atrial fibrillation: Secondary | ICD-10-CM | POA: Diagnosis not present

## 2024-01-14 DIAGNOSIS — I5043 Acute on chronic combined systolic (congestive) and diastolic (congestive) heart failure: Secondary | ICD-10-CM | POA: Diagnosis not present

## 2024-01-14 DIAGNOSIS — R579 Shock, unspecified: Secondary | ICD-10-CM | POA: Diagnosis not present

## 2024-01-14 LAB — RENAL FUNCTION PANEL
Albumin: 1.5 g/dL — ABNORMAL LOW (ref 3.5–5.0)
Albumin: 1.5 g/dL — ABNORMAL LOW (ref 3.5–5.0)
Anion gap: 13 (ref 5–15)
Anion gap: 14 (ref 5–15)
BUN: 48 mg/dL — ABNORMAL HIGH (ref 6–20)
BUN: 51 mg/dL — ABNORMAL HIGH (ref 6–20)
CO2: 19 mmol/L — ABNORMAL LOW (ref 22–32)
CO2: 19 mmol/L — ABNORMAL LOW (ref 22–32)
Calcium: 8.7 mg/dL — ABNORMAL LOW (ref 8.9–10.3)
Calcium: 9 mg/dL (ref 8.9–10.3)
Chloride: 93 mmol/L — ABNORMAL LOW (ref 98–111)
Chloride: 91 mmol/L — ABNORMAL LOW (ref 98–111)
Creatinine, Ser: 3.88 mg/dL — ABNORMAL HIGH (ref 0.61–1.24)
Creatinine, Ser: 4.29 mg/dL — ABNORMAL HIGH (ref 0.61–1.24)
GFR, Estimated: 19 mL/min — ABNORMAL LOW (ref >60)
GFR, Estimated: 17 mL/min — ABNORMAL LOW (ref >60)
Glucose, Bld: 84 mg/dL (ref 70–99)
Glucose, Bld: 71 mg/dL (ref 70–99)
Phosphorus: 4.5 mg/dL (ref 2.5–4.6)
Phosphorus: 4.4 mg/dL (ref 2.5–4.6)
Potassium: 5 mmol/L (ref 3.5–5.1)
Potassium: 5.1 mmol/L (ref 3.5–5.1)
Sodium: 125 mmol/L — ABNORMAL LOW (ref 135–145)
Sodium: 124 mmol/L — ABNORMAL LOW (ref 135–145)

## 2024-01-14 LAB — BASIC METABOLIC PANEL WITH GFR
Anion gap: 16 — ABNORMAL HIGH (ref 5–15)
BUN: 48 mg/dL — ABNORMAL HIGH (ref 6–20)
CO2: 18 mmol/L — ABNORMAL LOW (ref 22–32)
Calcium: 8.8 mg/dL — ABNORMAL LOW (ref 8.9–10.3)
Chloride: 92 mmol/L — ABNORMAL LOW (ref 98–111)
Creatinine, Ser: 4.01 mg/dL — ABNORMAL HIGH (ref 0.61–1.24)
GFR, Estimated: 19 mL/min — ABNORMAL LOW (ref >60)
Glucose, Bld: 85 mg/dL (ref 70–99)
Potassium: 5 mmol/L (ref 3.5–5.1)
Sodium: 126 mmol/L — ABNORMAL LOW (ref 135–145)

## 2024-01-14 LAB — CBC
HCT: 29.2 % — ABNORMAL LOW (ref 39.0–52.0)
Hemoglobin: 9.7 g/dL — ABNORMAL LOW (ref 13.0–17.0)
MCH: 29.8 pg (ref 26.0–34.0)
MCHC: 33.2 g/dL (ref 30.0–36.0)
MCV: 89.6 fL (ref 80.0–100.0)
Platelets: 137 10*3/uL — ABNORMAL LOW (ref 150–400)
RBC: 3.26 MIL/uL — ABNORMAL LOW (ref 4.22–5.81)
RDW: 21.5 % — ABNORMAL HIGH (ref 11.5–15.5)
WBC: 21 10*3/uL — ABNORMAL HIGH (ref 4.0–10.5)
nRBC: 0 % (ref 0.0–0.2)

## 2024-01-14 LAB — MAGNESIUM: Magnesium: 2.2 mg/dL (ref 1.7–2.4)

## 2024-01-14 LAB — APTT
aPTT: 105 s — ABNORMAL HIGH (ref 24–36)
aPTT: 100 s — ABNORMAL HIGH (ref 24–36)
aPTT: 97 s — ABNORMAL HIGH (ref 24–36)

## 2024-01-14 LAB — HCV AB W REFLEX TO QUANT PCR: HCV Ab: NONREACTIVE

## 2024-01-14 LAB — HCV INTERPRETATION

## 2024-01-14 MED ORDER — SODIUM ZIRCONIUM CYCLOSILICATE 10 G PO PACK
10.0000 g | PACK | Freq: Once | ORAL | Status: AC
  Administered 2024-01-14: 10 g via ORAL
  Filled 2024-01-14: qty 1

## 2024-01-14 MED ORDER — HYDROMORPHONE HCL 1 MG/ML IJ SOLN
0.5000 mg | INTRAMUSCULAR | Status: DC | PRN
  Administered 2024-01-14 – 2024-01-22 (×23): 0.5 mg via INTRAVENOUS
  Filled 2024-01-14 (×26): qty 1

## 2024-01-14 MED ORDER — SODIUM ZIRCONIUM CYCLOSILICATE 10 G PO PACK
10.0000 g | PACK | Freq: Three times a day (TID) | ORAL | Status: DC
  Administered 2024-01-14 – 2024-01-16 (×3): 10 g via ORAL
  Filled 2024-01-14 (×6): qty 1

## 2024-01-14 MED ORDER — DARBEPOETIN ALFA 150 MCG/0.3ML IJ SOSY
150.0000 ug | PREFILLED_SYRINGE | INTRAMUSCULAR | Status: DC
  Administered 2024-01-14 – 2024-01-21 (×2): 150 ug via SUBCUTANEOUS
  Filled 2024-01-14 (×3): qty 0.3

## 2024-01-14 NOTE — IPAL (Addendum)
  Interdisciplinary Goals of Care Family Meeting   Date carried out: 01/14/2024  Location of the meeting: Bedside  Member's involved: Physician, Bedside Registered Nurse, and Family Member or next of kin  Durable Power of Attorney or acting medical decision maker: Patient Phillip Young, mother present. Aunt on the phone.     Discussion: We discussed goals of care for Phillip Young .  Earlier today Phillip Young was having lots of pain and expressed to myself and nurse Sedrick that he wanted to die. Unfortunately unable to tolerate HD due to high vasopressor requirements. Did not tolerate coming down on the norepinephrine . Phillip Young is very weak, not getting out of bed although mom acknowledges that goal is get get better blood pressure, tolerate HD and get to rehab. I explained to mom that I don't think his blood pressure Phillip Young improve and we are dealing with end stage heart and kidney failure. Explained that he Phillip Young most likely die in this room and be unable to leave the hospital, but if getting out of this room is a desire, hospice could still be an option. Phillip Young says he wants to be full code. I did tell Phillip Young and his mom that when his breathing worsens due to not being able to tolerate dialysis, I would recommend transitioning to comfort measures at that time.  Code status:   Code Status: Full Code   Disposition: Continue current acute care  Time spent for the meeting: 15 minutes.     Phillip GORMAN Gore, MD  01/14/2024, 3:54 PM

## 2024-01-14 NOTE — Progress Notes (Signed)
 PHARMACY - ANTICOAGULATION  Pharmacy Consult for bivalirudin    Indication: atrial fibrillation w/ hx of HIT   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, SRA positive   Metoprolol  Other (See Comments)    near syncope   Other Swelling and Other (See Comments)    Steroids Fluid seeping out of legs Nebulizer(unknown name of neb) elevated pressure    Tramadol      unknown    Patient Measurements: Height: 6' (182.9 cm) Weight: (!) 159.4 kg (351 lb 6.6 oz) IBW/kg (Calculated) : 77.6 HEPARIN  DW (KG): 125.9  Vital Signs: Temp: 98.6 F (37 C) (06/21 0723) Temp Source: Axillary (06/21 0723) BP: 112/76 (06/21 0930) Pulse Rate: 118 (06/21 0930)  Labs: Recent Labs    01/13/24 0426 01/13/24 1442 01/13/24 1643 01/14/24 0818  HGB 9.9*  --   --  9.7*  HCT 30.6*  --   --  29.2*  PLT 195  --   --  137*  APTT 99* 99*  --  105*  CREATININE 2.48*  --  3.16* 3.88*  4.01*    Estimated Creatinine Clearance: 40.3 mL/min (A) (by C-G formula based on SCr of 3.88 mg/dL (H)).  Assessment: 38 y.o. male with h/o Afib, Eliquis  on hold for CRRT, for argatroban  due to h/o HIT. Remains on CRRT. HgB 9.8 and PLTs 281, aPTT downtrending but remains elevated at 61. No baseline aPTT from this admission, T-bili continues to increase to 20.7, noted yellowing of sclera on exam. Argatroban  off since 6/13 around noon. Unclear if aPTT elevated due to liver dysfunction/congestion, underlying coagulopathy, or due to argatroban . Half life of argatroban  even with liver dysfunction approximately 190 minutes so would anticipate this to be out of patient's system given it has been off since 6/13. T-bili during last admission around 3 with no baseline aPTT reading. INR upon admission 11/2023  was 3.5 INR this admission was 2.9 but noted on Eliquis  PTA. Patient's baseline suspected to be around 55. Due to liver impairment will transition to bival while on CRRT. In addition to Afib, patient high risk for PE with weight, low EF, and minimal movement.   aPTT slightly above goal at 105. No bleeding noted. Off of CRRT since 6/19 PM - however will continue to hold DOAC given rising T-bili and ongoing liver failure suspected due to congestion from known end stage HF.    Goal of Therapy:  aPTT: 80-100, 1.5x baseline of approx 55  Monitor platelets by anticoagulation protocol: Yes   Plan:  Reduce bivalirudin  slightly to 0.070 mg/kg/hr utilizing adjusted body weight.  Monitor for s/sx of bleeding.  Adjust aPTT targets given baseline aPTT is around 55. Target aPTT per protocol is 50-85 however unreliable target with baseline elevation. Targeting 1.5x baseline aPTT of 55 after discussion w/ provider.  Will check aPTT ~4 hours after dose change  Randall Call, PharmD, BCPS 01/14/24 10:23 AM

## 2024-01-14 NOTE — Progress Notes (Signed)
 Called into patient room to hand patient his cell phone. Patient states he's calling a medical facility because he can't live like this gave patient his phone at his request. Mother at bedside. Patient requesting pain medication, and informed patient that his blood pressure is too low to sustain IV pain medication. Mother verbalizes understanding. Patient states I can't live like this informed patient that as his blood pressure becomes more stable then I will be able to safely give medication for pain. Mother verbalizes understanding

## 2024-01-14 NOTE — Progress Notes (Signed)
 Received consult on behalf of pt at 0720. This RN secure chatted primary RN at 0730 and stated that this patient would need a central line due to levo infusing peripherally >6 days and at 15mcg/min. RN stated she would notify primary team about possible placement of central line at 0800.

## 2024-01-14 NOTE — Progress Notes (Signed)
 [facility] NEPHROLOGY PROGRESS NOTE  Assessment/ Plan: Pt is a 38 y.o. yo male with past medical history of biventricular heart failure EF less than 20, chronic A-fib, ESRD on HD, chronic hypotension on high-dose midodrine , nonadherence with outpatient HD and treatment, recently at [facility] presented with shock and volume overload.  # Shock, cardiogenic:  12/06/23 EF < 20%. Sepsis less likely with neg cultures, abx off. Not a candidate for advanced therapies  # ESRD:  on HD since 01/2021.  Was [facility] MWF, noncompliant with outpatient HD with chronic hyperkalemia.  CRRT stopped 01/12/24.  He remains on significant doses of levophed  and midodrine  with unfavorable hemodynamics.  There is unacceptable risk of harm to the pt to attempt IHD at this time- therefore we will not offer it today.    # Anemia of ESRD: Dosed Aranesp .  Transfuse as needed.  # Secondary hyperparathyroidism/hyperphosphatemia: Currently on CRRT.  Follow labs.  # Chronic hypotension/volume: On Levophed  and midodrine  for shock and receiving midodrine   # a fib: usually rate controlled, this AM low 100s. Decrease UF rate today.   #HIT: avoiding heparin   #Elevated Tbili: with synthetic dysfunction  GOC:  ESRD and end-stage heart failure. Unless he makes a significant turnaround, he will not be a candidate for hemodialysis.  Comfort/ palliative approach is appropriate at any time.  Greatly appreciate PCCM and palliative care.  Subjective:  Continues to be hemodynamically unstable for intermittent hemodialysis.  I discussed this with him and his mother today.    Objective Vital signs in last 24 hours: Vitals:   01/14/24 1515 01/14/24 1525 01/14/24 1530 01/14/24 1545  BP: (!) 84/58  (!) 96/53 (!) 82/62  Pulse: (!) 105  (!) 122 (!) 112  Resp: (!) 22  (!) 33 (!) 23  Temp:  (!) 97.2 F (36.2 C)    TempSrc:  Oral    SpO2: 96%  98% 98%  Weight:      Height:       Weight change:   Intake/Output Summary (Last 24  hours) at 01/14/2024 1716 Last data filed at 01/14/2024 1400 Gross per 24 hour  Intake 2050.1 ml  Output --  Net 2050.1 ml       Labs: RENAL PANEL Recent Labs  Lab 01/10/24 0549 01/10/24 1208 01/11/24 0840 01/11/24 0841 01/12/24 0422 01/12/24 0423 01/12/24 1641 01/13/24 0426 01/13/24 1643 01/14/24 0818 01/14/24 1527  NA 131*   < >  --    < >  --    < > 129* 127* 126* 125*  126* 124*  K 4.0   < >  --    < >  --    < > 4.3 4.5 6.2* 5.0  5.0 5.1  CL 100   < >  --    < >  --    < > 95* 93* 93* 93*  92* 91*  CO2 23   < >  --    < >  --    < > 20* 21* 17* 19*  18* 19*  GLUCOSE 88   < >  --    < >  --    < > 98 84 83 84  85 71  BUN 17   < >  --    < >  --    < > 17 28* 38* 48*  48* 51*  CREATININE 1.42*   < >  --    < >  --    < > 1.81* 2.48*  3.16* 3.88*  4.01* 4.29*  CALCIUM  8.6*   < >  --    < >  --    < > 9.2 9.1 8.6* 8.7*  8.8* 9.0  MG 2.1  --  2.1  --  2.3  --   --  2.1  --  2.2  --   PHOS 2.7   < >  --    < >  --    < > 3.1 3.4 3.7 4.5 4.4  ALBUMIN  1.6*   < > 1.6*   < > 1.7*   < > 1.6* 1.6*  1.6* 1.6* 1.5* 1.5*   < > = values in this interval not displayed.    Liver Function Tests: Recent Labs  Lab 01/11/24 0840 01/11/24 0841 01/12/24 0422 01/12/24 0423 01/13/24 0426 01/13/24 1643 01/14/24 0818 01/14/24 1527  AST 60*  --  66*  --  61*  --   --   --   ALT 33  --  36  --  38  --   --   --   ALKPHOS 283*  --  298*  --  254*  --   --   --   BILITOT 22.2*  --  24.6*  --  23.8*  --   --   --   PROT 6.8  --  7.4  --  7.2  --   --   --   ALBUMIN  1.6*   < > 1.7*   < > 1.6*  1.6* 1.6* 1.5* 1.5*   < > = values in this interval not displayed.   No results for input(s): LIPASE, AMYLASE in the last 168 hours. Recent Labs  Lab 01/09/24 1059  AMMONIA 44*   CBC: Recent Labs    02/21/23 1538 02/22/23 0209 04/14/23 0314 04/15/23 1054 05/19/23 2020 05/20/23 1422 08/31/23 0348 09/01/23 0343 01/07/24 0246 01/08/24 0451 01/09/24 0236 01/13/24 0426  01/14/24 0818  HGB  --    < >  --    < >  --    < > 10.3*   < > 9.3* 9.3* 9.8* 9.9* 9.7*  MCV  --    < >  --    < >  --    < > 100.6*   < > 90.7 91.7 91.2 91.6 89.6  VITAMINB12  --   --  1,014*  --   --   --  815  --   --   --   --   --   --   FOLATE 12.0  --   --   --   --   --  7.9  --   --   --   --   --   --   FERRITIN  --   --   --   --  359*  --  241  --   --   --   --   --   --   TIBC  --   --   --   --  211*  --  364  --   --   --   --   --   --   IRON  --   --   --   --  64  --  58  --   --   --   --   --   --    < > = values in this interval not displayed.    Cardiac Enzymes: No results  for input(s): CKTOTAL, CKMB, CKMBINDEX, TROPONINI in the last 168 hours. CBG: No results for input(s): GLUCAP in the last 168 hours.   Iron Studies: No results for input(s): IRON, TIBC, TRANSFERRIN, FERRITIN in the last 72 hours. Studies/Results: No results found.    Medications: Infusions:  anticoagulant sodium citrate      bivalirudin  (ANGIOMAX ) 250 mg in sodium chloride  0.9 % 500 mL (0.5 mg/mL) infusion 0.07 mg/kg/hr (01/14/24 1400)   norepinephrine  (LEVOPHED ) Adult infusion 14 mcg/min (01/14/24 1428)    Scheduled Medications:  Chlorhexidine  Gluconate Cloth  6 each Topical Daily   cinacalcet   30 mg Oral Q M,W,F   Darbepoetin Alfa   150 mcg Subcutaneous Q7 days   digoxin   0.125 mg Oral Q48H   feeding supplement (NEPRO CARB STEADY)  237 mL Oral BID BM   lactulose   30 g Oral BID   leptospermum manuka honey  1 Application Topical Daily   levothyroxine   25 mcg Oral Q0600   liver oil-zinc  oxide   Topical BID   midodrine   40 mg Oral Q8H   multivitamin  1 tablet Oral QHS   mouth rinse  15 mL Mouth Rinse 4 times per day   polyethylene glycol  17 g Oral Daily   pregabalin   50 mg Oral Daily   senna  1 tablet Oral Daily   sodium zirconium cyclosilicate   10 g Oral TID   ursodiol   300 mg Oral TID   vancomycin  variable dose per unstable renal function (pharmacist dosing)    Does not apply See admin instructions   venlafaxine  XR  37.5 mg Oral Daily   zinc  sulfate (50mg  elemental zinc )  220 mg Oral Daily    have reviewed scheduled and prn medications.  Physical Exam: General:NAD, lying flat, sleepy but arousable Heart:RRR, s1s2 nl Lungs: Bilateral distant breath sound Abdomen:soft, mildly distended Extremities: 1-2+ Peripheral edema Dialysis Access: TDC  Devaughn Savant 01/14/2024,5:16 PM  LOS: 11 days

## 2024-01-14 NOTE — Plan of Care (Signed)

## 2024-01-14 NOTE — Progress Notes (Signed)
 PHARMACY - ANTICOAGULATION  Pharmacy Consult for bivalirudin    Indication: atrial fibrillation w/ hx of HIT   Patient Measurements: Height: 6' (182.9 cm) Weight: (!) 159.4 kg (351 lb 6.6 oz) IBW/kg (Calculated) : 77.6 HEPARIN  DW (KG): 125.9  Vital Signs: Temp: 98.4 F (36.9 C) (06/21 1938) Temp Source: Oral (06/21 1938) BP: 81/57 (06/21 2215) Pulse Rate: 124 (06/21 2130)  Labs: Recent Labs    01/13/24 0426 01/13/24 1442 01/13/24 1643 01/14/24 0818 01/14/24 1527 01/14/24 2204  HGB 9.9*  --   --  9.7*  --   --   HCT 30.6*  --   --  29.2*  --   --   PLT 195  --   --  137*  --   --   APTT 99*   < >  --  105* 100* 97*  CREATININE 2.48*  --  3.16* 3.88*  4.01* 4.29*  --    < > = values in this interval not displayed.    Estimated Creatinine Clearance: 36.4 mL/min (A) (by C-G formula based on SCr of 4.29 mg/dL (H)).  Assessment: 38 y.o. male with h/o Afib, Eliquis  on hold for CRRT, for argatroban  due to h/o HIT. Remains on CRRT. HgB 9.8 and PLTs 281, aPTT downtrending but remains elevated at 61. No baseline aPTT from this admission, T-bili continues to increase to 20.7, noted yellowing of sclera on exam. Argatroban  off since 6/13 around noon. Unclear if aPTT elevated due to liver dysfunction/congestion, underlying coagulopathy, or due to argatroban . Half life of argatroban  even with liver dysfunction approximately 190 minutes so would anticipate this to be out of patient's system given it has been off since 6/13. T-bili during last admission around 3 with no baseline aPTT reading. INR upon admission 11/2023 was 3.5 INR this admission was 2.9 but noted on Eliquis  PTA. Patient's baseline suspected to be around 55. Due to liver impairment will transition to bival while on CRRT. In addition to Afib, patient high risk for PE with weight, low EF, and minimal movement.   aPTT slightly above goal at 105. No bleeding noted. Off of CRRT since 6/19 PM - however will continue to hold DOAC given  rising T-bili and ongoing liver failure suspected due to congestion from known end stage HF.    PM: aPTT = 97, at upper end of goal range after rate decrease to 0.065 mg/kg/hr. No issues with the infusion or bleeding reported per RN.  Goal of Therapy:  aPTT: 80-100, 1.5x baseline of approx 55  Monitor platelets by anticoagulation protocol: Yes   Plan:  Reduce bivalirudin  slightly to 0.060 mg/kg/hr utilizing adjusted body weight given still on upper limit of goal.  Monitor for s/sx of bleeding.  Adjust aPTT targets given baseline aPTT is around 55. Target aPTT per protocol is 50-85 however unreliable target with baseline elevation. Targeting 1.5x baseline aPTT of 55 after discussion w/ provider.  Will check aPTT with AM labs   Lynwood Poplar, PharmD, BCPS Clinical Pharmacist 01/14/2024 10:48 PM

## 2024-01-14 NOTE — Progress Notes (Signed)
 Pt found pumping large bottle of hand sanitizer into his mouth. Bottle of hand sanitizer immediately taken from patient and removed from bedside. All lotions, sanitizers, wound care solutions, and sharps removed from patient bedside. Patient educated and denies drinking the hand sanitizer. Elink notified. Psych consult planned for AM. RN continuing to monitor

## 2024-01-14 NOTE — Progress Notes (Signed)
 PHARMACY - ANTICOAGULATION  Pharmacy Consult for bivalirudin    Indication: atrial fibrillation w/ hx of HIT   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, SRA positive   Metoprolol  Other (See Comments)    near syncope   Other Swelling and Other (See Comments)    Steroids Fluid seeping out of legs Nebulizer(unknown name of neb) elevated pressure    Tramadol      unknown    Patient Measurements: Height: 6' (182.9 cm) Weight: (!) 159.4 kg (351 lb 6.6 oz) IBW/kg (Calculated) : 77.6 HEPARIN  DW (KG): 125.9  Vital Signs: Temp: 97.2 F (36.2 C) (06/21 1525) Temp Source: Oral (06/21 1525) BP: 113/88 (06/21 1715) Pulse Rate: 109 (06/21 1700)  Labs: Recent Labs    01/13/24 0426 01/13/24 1442 01/13/24 1643 01/14/24 0818 01/14/24 1527  HGB 9.9*  --   --  9.7*  --   HCT 30.6*  --   --  29.2*  --   PLT 195  --   --  137*  --   APTT 99* 99*  --  105* 100*  CREATININE 2.48*  --  3.16* 3.88*  4.01* 4.29*    Estimated Creatinine Clearance: 36.4 mL/min (A) (by C-G formula based on SCr of 4.29 mg/dL (H)).  Assessment: 38 y.o. male with h/o Afib, Eliquis  on hold for CRRT, for argatroban  due to h/o HIT. Remains on CRRT. HgB 9.8 and PLTs 281, aPTT downtrending but remains elevated at 61. No baseline aPTT from this admission, T-bili continues to increase to 20.7, noted yellowing of sclera on exam. Argatroban  off since 6/13 around noon. Unclear if aPTT elevated due to liver dysfunction/congestion, underlying coagulopathy, or due to argatroban . Half life of argatroban  even with liver dysfunction approximately 190 minutes so would anticipate this to be out of patient's system given it has been off since 6/13. T-bili during last admission around 3 with no baseline  aPTT reading. INR upon admission 11/2023 was 3.5 INR this admission was 2.9 but noted on Eliquis  PTA. Patient's baseline suspected to be around 55. Due to liver impairment will transition to bival while on CRRT. In addition to Afib, patient high risk for PE with weight, low EF, and minimal movement.   aPTT slightly above goal at 105. No bleeding noted. Off of CRRT since 6/19 PM - however will continue to hold DOAC given rising T-bili and ongoing liver failure suspected due to congestion from known end stage HF.    PM: aPTT = 100, at upper end of goal range. No issues with the infusion or bleeding reported per RN.  Goal of Therapy:  aPTT: 80-100, 1.5x baseline of approx 55  Monitor platelets by anticoagulation protocol: Yes   Plan:  Reduce bivalirudin  slightly to 0.065 mg/kg/hr utilizing adjusted body weight.  Monitor for s/sx of bleeding.  Adjust aPTT targets given baseline aPTT is around 55. Target aPTT per protocol is 50-85 however unreliable target with baseline elevation. Targeting 1.5x baseline aPTT of 55 after discussion w/ provider.  Will check aPTT ~4 hours after dose change  Rocky Slade, PharmD, BCPS 01/14/24 5:28 PM  Please check AMION for all [facility] phone numbers After 10:00 PM, call Main Pharmacy [phone]

## 2024-01-14 NOTE — Progress Notes (Signed)
 NAME:  Phillip Young, MRN:  [id number], DOB:  [birth year], LOS: 11 ADMISSION DATE:  01/03/2024, CONSULTATION DATE:  01/03/24 REFERRING MD:  Dr. Patsey, CHIEF COMPLAINT:  SOB/ chills   History of Present Illness:  52 yoM w/PMH as below significant for biventricular HF (EF < 20%), chronic afib on Eliquis , chronic hypotension on midodrine  30mg  TID, ESRD, chronic hypoxic and hypercarbic respiratory failure on NIV, morbid obesity, medical non-compliance, and chronic pain presenting from home after being discharged from [facility] 6/9 with SOB, chills, subjective fever, ongoing poor PO, vomiting today strings of blood but then reported it was with coughing episodes, pain all over, and increased weeping from left thigh blister.  Had BM, unclear on consistency/ color.  Reports last dialysis yesterday.  Initially reports not taking any PO meds today but then said he did this morning.  Non- ambulatory at baseline.  Frequent admits, most recently 5/13-5/22 for recurrent septic shock and MRSE bacteremia due to suspected line complicated by acute on chronic HF and respiratory failure, suspected IE but not a candidate for TEE, discharged on vancomycin  with HD scheduled thru 6/30.  TDC was replaced 5/22 after 2 day line holiday.  Also started on micafungin  w/HD at [facility] after [facility] grew candida species, scheduled thru 6/30.   In ER, temp 99.1, on HFNC at 6L, with SBP into 70's requiring NE support after 500 ml bolus.  Labs significant for Na 129, K 6.3, Cl 89, alk phos 346, AST 84, t.bili 12 (recent 3.4 on 5/14), lactic 3.8, WBC 20.6 (previously 8.1 on 5/22), INR 2.9, CXR showing vascular congestion and increasing congestion and pleural effusions.  BC sent and empirically started on linezolid , flagyl , and cefepime .  Pending CT chest/abd/ pelvis.  Nephrology consulted.  PCCM called for admit.   Pertinent  Medical History   Past Medical History:  Diagnosis Date   Acute on chronic respiratory failure with hypoxia ([name])  04/21/2021   Acute on chronic systolic (congestive) heart failure ([name]) 02/26/2020   Amiodarone  toxicity    Anemia    Biventricular congestive heart failure ([name])    Candida parapsilosis infection 12/26/2023   Chronic hypoxemic respiratory failure ([name])    Class 3 severe obesity due to excess calories with serious comorbidity and body mass index (BMI) of 50.0 to 59.9 in adult 02/26/2020   ESRD on hemodialysis [facility])    Essential hypertension 02/26/2020   GERD without esophagitis 02/26/2020   Hidradenitis suppurativa 02/26/2020   NICM (nonischemic cardiomyopathy) ([name])    Obesity hypoventilation syndrome ([name])    OSA (obstructive sleep apnea)    Pneumonia    Prediabetes 02/26/2020   Significant Hospital Events: Including procedures, antibiotic start and stop dates in addition to other pertinent events   6/10 Admit 6/12 Remains on vasopressors.  Spoke to heart failure.  Not a candidate for advanced therapies.  Not a candidate for tailored hemodynamics.  Recommend a strategy of vasopressor support at fluid removal in an attempt to transition back to IHD. 6/15 patient brought up discussion of limiting some of his care; Palliative re-consulted.  6/16 palliative consulted per patient's request. CT unrevealing for cause of abdominal pain.  Interim History / Subjective:  On 15 mcg of norepinephrine . On 40 tid of midodrine . Unable to tolerate HD today Saying he is in pain.   Objective    Blood pressure 92/71, pulse (!) 119, temperature 98.6 F (37 C), temperature source Axillary, resp. rate (!) 22, height 6' (1.829 m), weight (!) 159.4 kg, SpO2 98%.  Intake/Output Summary (Last 24 hours) at 01/14/2024 0823 Last data filed at 01/14/2024 0700 Gross per 24 hour  Intake 1888.9 ml  Output --  Net 1888.9 ml   Filed Weights   01/08/24 0500 01/09/24 0305 01/13/24 0500  Weight: (!) 164.7 kg (!) 163 kg (!) 159.4 kg   Examination: Chronically ill appearing obese man On room air RRR,  breath sounds diminished Lung sounds diminished, no wheeze Abdomen soft Tunneled HD catheter in place.  Chronic lower extremity edema   -17L since admission +2L in the last 24 hours.  Mag today 2.2 rest of bmet pending  RUQ US  with doppler: patent portal veins, likely cirrhosis, underdistended GB with mild wall thickening   Resolved problem list:   Abdominal pain; worry this is related to his constipation Acute metabolic encephalopathy; suspect pain meds may be contributing to this some  Assessment and Plan  Shock ([name])> suspect primarily cardiogenic; sepsis also likely with leukocytosis, but no source has been identified Continue vasopressor support with Levophed , currently on 15 mics with MAP goal 60 Continue high-dose midodrine  at 40 mg 3 times daily Continue IV vancomycin  to treat cellulitis Not a candidate for advanced heart failure therapy  Hyperbilirubinemia-- congestive vs low forward flow state. 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   Imaging has been inconsistent, but possible cirrhosis on recent RUQ US > his picture would fit for cardiac cirrhosis. No signs of obstruction.  Acaculous cholecystitis is possible> no suggestion of this on any of his imaging. Patent hepatic portal veins so no Budd Chiari. Low suspicion for DILI with normal transaminases.  Continue trial of ursodiol   Acute on chronic biventricular congestive heart failure ([name])-combined sysolic & diastolic; known NICM. Echo shows severe RV and LV failure with EF <20%. Non-compliant for many years. Per advanced heart failure, patient is not a candidate for advanced therapy Unable to tolerate GDMT in the setting of hypotension CRRT was stopped, still requiring vasopressor support, would not tolerate IHD If he is unable to wean off pressors, hospice care will be the only reasonable option.  Right now I think it is highly unlikely he will be able to resume iHD with his current status. Palliative care reconsulted at the  patient's request earlier this week.  He remains at high risk for decompensation this admission.   ESRD on hemodialysis CRRT was stopped yesterday Unable to tolerate HD due to high norepinephrine  needs Patient mother was updated  Constipation-- no bowel movement the since entire admission He has been refusing his bowel regimen more often than not Continue to offer bowel regimen  Obesity hypoventilation syndrome - Intermittently compliant with home BIPAP. Recommend at bedtime bipap; hasn't been using  Permanent atrial fibrillation  Amiodarone  intolerant Heart rate is controlled Continue bival on CRRT Due to hyperbilirubinemia, can switch him to Eliquis  yet Continue digoxin  every 48 hours with.  Rec level checks   Chronic pain disorder Continue pregabalin  Stop oxycodone  and Dilaudid  considering low blood pressure  Mild hyperammonemia Lactulose  BID; he has been refusing  Skin ulceration; recent cellulitis  Has chronic skin ulceration, and he routinely has been refusing dressing changes. He understands he will not be able to make progress if we cannot keep his wounds clean. On examination: multiple areas of covered wound but no drainage or discoloration.  On Vanc- dosing per pharmacy   Long term prognosis remains grim. No good options if he is not able to tolerate iHD. Appreciate Palliative care team's management.  I have called the patient's mother to come into the  hospital to discuss goals of care. I think we are approaching his end of life.    Best Practice (right click and Reselect all SmartList Selections daily)   Diet/type: Regular consistency (see orders) DVT prophylaxis SCD, bival gtt Pressure ulcer(s): present on admission  GI prophylaxis: PPI Lines: Dialysis Catheter-pta R TDC (12/15/23) Foley:  N/A Code Status:  full code Last date of multidisciplinary goals of care discussion - 6/17 with PMT  The patient is critically ill due to shock, heart failure.   Critical care was necessary to treat or prevent imminent or life-threatening deterioration.  Critical care was time spent personally by me on the following activities: development of treatment plan with patient and/or surrogate as well as nursing, discussions with consultants, evaluation of patient's response to treatment, examination of patient, obtaining history from patient or surrogate, ordering and performing treatments and interventions, ordering and review of laboratory studies, ordering and review of radiographic studies, pulse oximetry, re-evaluation of patient's condition and participation in multidisciplinary rounds.   Critical Care Time devoted to patient care services described in this note is 85 minutes. This time reflects time of care of this signee Kayla Deshaies S Naven Giambalvo . This critical care time does not reflect separately billable procedures or procedure time, teaching time or supervisory time of PA/NP/Med student/Med Resident etc but could involve care discussion time.       Verdon GORMAN Meade Cloretta Pul[facility] 01/14/2024 8:23 AM  Pager: see AMION  If no response to pager , please call critical care on call (see AMION) until 7pm After 7:00 pm call Elink

## 2024-01-15 DIAGNOSIS — R579 Shock, unspecified: Secondary | ICD-10-CM | POA: Diagnosis not present

## 2024-01-15 DIAGNOSIS — I4821 Permanent atrial fibrillation: Secondary | ICD-10-CM | POA: Diagnosis not present

## 2024-01-15 DIAGNOSIS — I5043 Acute on chronic combined systolic (congestive) and diastolic (congestive) heart failure: Secondary | ICD-10-CM | POA: Diagnosis not present

## 2024-01-15 LAB — POCT I-STAT 7, (LYTES, BLD GAS, ICA,H+H)
Acid-base deficit: 5 mmol/L — ABNORMAL HIGH (ref 0.0–2.0)
Bicarbonate: 18.9 mmol/L — ABNORMAL LOW (ref 20.0–28.0)
Calcium, Ion: 1.08 mmol/L — ABNORMAL LOW (ref 1.15–1.40)
HCT: 30 % — ABNORMAL LOW (ref 39.0–52.0)
Hemoglobin: 10.2 g/dL — ABNORMAL LOW (ref 13.0–17.0)
O2 Saturation: 98 %
Patient temperature: 98.9
Potassium: 4.8 mmol/L (ref 3.5–5.1)
Sodium: 123 mmol/L — ABNORMAL LOW (ref 135–145)
TCO2: 20 mmol/L — ABNORMAL LOW (ref 22–32)
pCO2 arterial: 31.2 mmHg — ABNORMAL LOW (ref 32–48)
pH, Arterial: 7.39 (ref 7.35–7.45)
pO2, Arterial: 107 mmHg (ref 83–108)

## 2024-01-15 LAB — CBC
HCT: 29.5 % — ABNORMAL LOW (ref 39.0–52.0)
Hemoglobin: 9.7 g/dL — ABNORMAL LOW (ref 13.0–17.0)
MCH: 28.9 pg (ref 26.0–34.0)
MCHC: 32.9 g/dL (ref 30.0–36.0)
MCV: 87.8 fL (ref 80.0–100.0)
Platelets: 81 10*3/uL — ABNORMAL LOW (ref 150–400)
RBC: 3.36 MIL/uL — ABNORMAL LOW (ref 4.22–5.81)
RDW: 21.3 % — ABNORMAL HIGH (ref 11.5–15.5)
WBC: 20.9 10*3/uL — ABNORMAL HIGH (ref 4.0–10.5)
nRBC: 0 % (ref 0.0–0.2)

## 2024-01-15 LAB — APTT
aPTT: 109 s — ABNORMAL HIGH (ref 24–36)
aPTT: 103 s — ABNORMAL HIGH (ref 24–36)
aPTT: 105 s — ABNORMAL HIGH (ref 24–36)

## 2024-01-15 LAB — GLUCOSE, CAPILLARY
Glucose-Capillary: 119 mg/dL — ABNORMAL HIGH (ref 70–99)
Glucose-Capillary: 58 mg/dL — ABNORMAL LOW (ref 70–99)

## 2024-01-15 MED ORDER — DEXTROSE 50 % IV SOLN
INTRAVENOUS | Status: AC
  Administered 2024-01-15: 12.5 g via INTRAVENOUS
  Filled 2024-01-15: qty 50

## 2024-01-15 MED ORDER — CYCLOBENZAPRINE HCL 5 MG PO TABS: 5.0000 mg | ORAL_TABLET | Freq: Three times a day (TID) | ORAL | Status: DC | PRN

## 2024-01-15 MED ORDER — SODIUM CHLORIDE 0.9 % IV SOLN
0.0200 mg/kg/h | INTRAVENOUS | Status: DC
  Administered 2024-01-15: 0.055 mg/kg/h via INTRAVENOUS
  Filled 2024-01-15: qty 250

## 2024-01-15 MED ORDER — DEXTROSE 50 % IV SOLN: 12.5000 g | INTRAVENOUS | Status: AC

## 2024-01-15 NOTE — Progress Notes (Signed)
 PHARMACY - ANTICOAGULATION CONSULT NOTE  Pharmacy Consult for bivalirudin  Indication: atrial fibrillation  Labs: Recent Labs    01/13/24 0426 01/13/24 1442 01/13/24 1643 01/14/24 0818 01/14/24 1527 01/14/24 2204 01/15/24 0410 01/15/24 0826 01/15/24 1424 01/15/24 2036  HGB 9.9*  --   --  9.7*  --   --  10.2* 9.7*  --   --   HCT 30.6*  --   --  29.2*  --   --  30.0* 29.5*  --   --   PLT 195  --   --  137*  --   --   --  81*  --   --   APTT 99*   < >  --  105* 100*   < >  --  109* 103* 105*  CREATININE 2.48*  --  3.16* 3.88*  4.01* 4.29*  --   --   --   --   --    < > = values in this interval not displayed.   Assessment: 38yo male remains supratherapeutic on bival with increase in PTT despite rate decrease; no infusion issues or signs of bleeding per RN.  Goal of Therapy:  aPTT 80-100 seconds   Plan:  Decrease bivalirudin  infusion by 25% to 0.03 mg/kg/hr (using previous weight of 111.8kg). Check PTT with am labs.   Marvetta Dauphin, PharmD, BCPS 01/15/2024 11:47 PM

## 2024-01-15 NOTE — Progress Notes (Signed)
 eLink Physician-Brief Progress Note Patient Name: Phillip Young DOB: 06/13/1986 MRN: [id number]   Date of Service  01/15/2024  HPI/Events of Note  RN requested ABG due to some lethargy and irregular resps. No distress on camera. I see notes saying that he has been refusing bipap and o2 overnight. O2 sat is 98   eICU Interventions  ABG ordered Plz inform results      Intervention Category Major Interventions: Respiratory failure - evaluation and management  Tyeasha Ebbs G Alline Pio 01/15/2024, 3:56 AM

## 2024-01-15 NOTE — Progress Notes (Signed)
 Attempted to give patient 2200 medications and provide patient care. Patient opens eyes to voice and answers this nurse by shaking head yes/no. Patient shook head yes to take medications. Informed patient of each medication he is getting. Removed bipap off patient and placed nasal canula on patient. Sat patient up in upright position. Patient took one sip of Lokelma , shook his head no and waved his hands at me. Patient opened his mouth to take oral medications. Patient spit meds out of mouth. Patient shook head no waved his hands at this nurse and motioned hands to lay him back in bed. Attempted to educate patient on medications and purpose of taking medications. . Patient shook his head yes and motions to lay him back. Offered to reposition patient- patient shook head no again. Placed patient back on bipap and laid patient back in previous position. Will continue to provide care to patient as he allows.

## 2024-01-15 NOTE — Progress Notes (Signed)
 On afternoons was somnolent. Slept for about 2 hours per bedside nurse and was not on bipap at that time. Difficult to arouse for medication administration. He later did take his meds. If ongoing confusion or lethargy recommend putting him on bipap prn for suspected hypercarbia.   Additional cc time ten minutes.

## 2024-01-15 NOTE — Progress Notes (Signed)
 [facility] NEPHROLOGY PROGRESS NOTE  Assessment/ Plan: Pt is a 38 y.o. yo male with past medical history of biventricular heart failure EF less than 20, chronic A-fib, ESRD on HD, chronic hypotension on high-dose midodrine , nonadherence with outpatient HD and treatment, recently at [facility] presented with shock and volume overload.  # Shock, cardiogenic:  12/06/23 EF < 20%. Sepsis less likely with neg cultures, abx off. Not a candidate for advanced therapies  # ESRD:  on HD since 01/2021.  Was [facility] MWF, noncompliant with outpatient HD with chronic hyperkalemia.  CRRT stopped 01/12/24.  He remains on significant doses of levophed  and midodrine  with unfavorable hemodynamics.  There is unacceptable risk of harm to the pt to attempt IHD at this time.  His levophed  dose has increased and BP appears to be trending downwards.  He remains unstable for iHD today.     # Anemia of ESRD: Dosed Aranesp .  Transfuse as needed.  # Chronic hypotension/volume: On Levophed  and midodrine  for shock   # a fib: Has been running in the 110s-130s  #HIT: avoiding heparin   #Elevated Tbili: with synthetic dysfunction  GOC:  ESRD and end-stage heart failure. Unless he makes a significant turnaround, he will not be a candidate for hemodialysis.  Comfort/ palliative approach is appropriate at any time.  Greatly appreciate PCCM and palliative care.  He has multisystem organ failure (ESRD, end-stage heart failure, hepatic dysfunction, skin wounds/ sloughing) and is not expected to survive this hospitalization.  Subjective:  Seen in room with MD Meade and RN Sedrick.  He remains in sig discomfort and distress.  Discussed again unfavorable hemodynamics to attempt IHD with pt and mom.  PT and mom expressed frustrations with care team.  Pt requested again to be evaluated by physical therapy- will have them eval tomorrow.      Objective Vital signs in last 24 hours: Vitals:   01/15/24 0800 01/15/24 0815 01/15/24 0830  01/15/24 0845  BP: (!) 86/49 (!) 85/68  (!) 73/41  Pulse: (!) 111 (!) 111 (!) 114 (!) 112  Resp: (!) 23 (!) 23 (!) 22 (!) 24  Temp:      TempSrc:      SpO2: 98% 98% 99% 99%  Weight:      Height:       Weight change:   Intake/Output Summary (Last 24 hours) at 01/15/2024 1034 Last data filed at 01/15/2024 0800 Gross per 24 hour  Intake 1498.46 ml  Output --  Net 1498.46 ml       Labs: RENAL PANEL Recent Labs  Lab 01/10/24 0549 01/10/24 1208 01/11/24 0840 01/11/24 0841 01/12/24 0422 01/12/24 0423 01/12/24 1641 01/13/24 0426 01/13/24 1643 01/14/24 0818 01/14/24 1527 01/15/24 0410  NA 131*   < >  --    < >  --    < > 129* 127* 126* 125*  126* 124* 123*  K 4.0   < >  --    < >  --    < > 4.3 4.5 6.2* 5.0  5.0 5.1 4.8  CL 100   < >  --    < >  --    < > 95* 93* 93* 93*  92* 91*  --   CO2 23   < >  --    < >  --    < > 20* 21* 17* 19*  18* 19*  --   GLUCOSE 88   < >  --    < >  --    < >  98 84 83 84  85 71  --   BUN 17   < >  --    < >  --    < > 17 28* 38* 48*  48* 51*  --   CREATININE 1.42*   < >  --    < >  --    < > 1.81* 2.48* 3.16* 3.88*  4.01* 4.29*  --   CALCIUM  8.6*   < >  --    < >  --    < > 9.2 9.1 8.6* 8.7*  8.8* 9.0  --   MG 2.1  --  2.1  --  2.3  --   --  2.1  --  2.2  --   --   PHOS 2.7   < >  --    < >  --    < > 3.1 3.4 3.7 4.5 4.4  --   ALBUMIN  1.6*   < > 1.6*   < > 1.7*   < > 1.6* 1.6*  1.6* 1.6* 1.5* 1.5*  --    < > = values in this interval not displayed.    Liver Function Tests: Recent Labs  Lab 01/11/24 0840 01/11/24 0841 01/12/24 0422 01/12/24 0423 01/13/24 0426 01/13/24 1643 01/14/24 0818 01/14/24 1527  AST 60*  --  66*  --  61*  --   --   --   ALT 33  --  36  --  38  --   --   --   ALKPHOS 283*  --  298*  --  254*  --   --   --   BILITOT 22.2*  --  24.6*  --  23.8*  --   --   --   PROT 6.8  --  7.4  --  7.2  --   --   --   ALBUMIN  1.6*   < > 1.7*   < > 1.6*  1.6* 1.6* 1.5* 1.5*   < > = values in this interval not  displayed.   No results for input(s): LIPASE, AMYLASE in the last 168 hours. Recent Labs  Lab 01/09/24 1059  AMMONIA 44*   CBC: Recent Labs    02/21/23 1538 02/22/23 0209 04/14/23 0314 04/15/23 1054 05/19/23 2020 05/20/23 1422 08/31/23 0348 09/01/23 0343 01/09/24 0236 01/13/24 0426 01/14/24 0818 01/15/24 0410 01/15/24 0826  HGB  --    < >  --    < >  --    < > 10.3*   < > 9.8* 9.9* 9.7* 10.2* 9.7*  MCV  --    < >  --    < >  --    < > 100.6*   < > 91.2 91.6 89.6  --  87.8  VITAMINB12  --   --  1,014*  --   --   --  815  --   --   --   --   --   --   FOLATE 12.0  --   --   --   --   --  7.9  --   --   --   --   --   --   FERRITIN  --   --   --   --  359*  --  241  --   --   --   --   --   --   TIBC  --   --   --   --  211*  --  364  --   --   --   --   --   --   IRON  --   --   --   --  64  --  58  --   --   --   --   --   --    < > = values in this interval not displayed.    Cardiac Enzymes: No results for input(s): CKTOTAL, CKMB, CKMBINDEX, TROPONINI in the last 168 hours. CBG: Recent Labs  Lab 01/15/24 0341 01/15/24 0403  GLUCAP 58* 119*     Iron Studies: No results for input(s): IRON, TIBC, TRANSFERRIN, FERRITIN in the last 72 hours. Studies/Results: No results found.    Medications: Infusions:  anticoagulant sodium citrate      bivalirudin  (ANGIOMAX ) 250 mg in sodium chloride  0.9 % 500 mL (0.5 mg/mL) infusion     norepinephrine  (LEVOPHED ) Adult infusion 16 mcg/min (01/15/24 0821)    Scheduled Medications:  Chlorhexidine  Gluconate Cloth  6 each Topical Daily   cinacalcet   30 mg Oral Q M,W,F   Darbepoetin Alfa   150 mcg Subcutaneous Q7 days   digoxin   0.125 mg Oral Q48H   feeding supplement (NEPRO CARB STEADY)  237 mL Oral BID BM   lactulose   30 g Oral BID   leptospermum manuka honey  1 Application Topical Daily   levothyroxine   25 mcg Oral Q0600   liver oil-zinc  oxide   Topical BID   midodrine   40 mg Oral Q8H   multivitamin  1  tablet Oral QHS   mouth rinse  15 mL Mouth Rinse 4 times per day   polyethylene glycol  17 g Oral Daily   pregabalin   50 mg Oral Daily   senna  1 tablet Oral Daily   sodium zirconium cyclosilicate   10 g Oral TID   ursodiol   300 mg Oral TID   vancomycin  variable dose per unstable renal function (pharmacist dosing)   Does not apply See admin instructions   venlafaxine  XR  37.5 mg Oral Daily   zinc  sulfate (50mg  elemental zinc )  220 mg Oral Daily    have reviewed scheduled and prn medications.  Physical Exam: General:NAD, lying flat, sleepy but arousable Heart:RRR, s1s2 nl Lungs: Bilateral distant breath sound Abdomen:soft, mildly distended Extremities: 1-2+ Peripheral edema Dialysis Access: TDC  Marbin Olshefski 01/15/2024,10:34 AM  LOS: 12 days

## 2024-01-15 NOTE — Progress Notes (Addendum)
 Pt placed on home BIPAP  due to increased WOB and pt wanting to nap. Pt tolerating well at this time, RN at bedside.     01/15/24 1550  Therapy Vitals  Pulse Rate (!) 203  Resp (!) 32  BP 122/67  MEWS Score/Color  MEWS Score 5  MEWS Score Color Red  Respiratory Assessment  Assessment Type Assess only  Respiratory Pattern Regular;Labored  Chest Assessment Chest expansion symmetrical  Cough None  Bilateral Breath Sounds Diminished  Oxygen  Therapy/Pulse Ox  O2 Device (S)  Bi-PAP (home unit)  O2 Therapy Oxygen   SpO2 90 %

## 2024-01-15 NOTE — Progress Notes (Addendum)
 PHARMACY - ANTICOAGULATION  Pharmacy Consult for bivalirudin    Indication: atrial fibrillation w/ hx of HIT   Patient Measurements: Height: 6' (182.9 cm) Weight: (!) 159.8 kg (352 lb 4.7 oz) IBW/kg (Calculated) : 77.6 HEPARIN  DW (KG): 125.9  Vital Signs: Temp: 97.2 F (36.2 C) (06/22 0720) Temp Source: Axillary (06/22 0720) BP: 73/41 (06/22 0845) Pulse Rate: 112 (06/22 0845)  Labs: Recent Labs    01/13/24 0426 01/13/24 1442 01/13/24 1643 01/14/24 0818 01/14/24 1527 01/14/24 2204 01/15/24 0410 01/15/24 0826  HGB 9.9*  --   --  9.7*  --   --  10.2*  --   HCT 30.6*  --   --  29.2*  --   --  30.0*  --   PLT 195  --   --  137*  --   --   --   --   APTT 99*   < >  --  105* 100* 97*  --  109*  CREATININE 2.48*  --  3.16* 3.88*  4.01* 4.29*  --   --   --    < > = values in this interval not displayed.    Estimated Creatinine Clearance: 36.5 mL/min (A) (by C-G formula based on SCr of 4.29 mg/dL (H)).  Assessment: 37 y.o. male with h/o Afib, Eliquis  on hold for CRRT, for argatroban  due to h/o HIT. Remains on CRRT. HgB 9.8 and PLTs 281, aPTT downtrending but remains elevated at 61. No baseline aPTT from this admission, T-bili continues to increase to 20.7, noted yellowing of sclera on exam. Argatroban  off since 6/13 around noon. Unclear if aPTT elevated due to liver dysfunction/congestion, underlying coagulopathy, or due to argatroban . Half life of argatroban  even with liver dysfunction approximately 190 minutes so would anticipate this to be out of patient's system given it has been off since 6/13. T-bili during last admission around 3 with no baseline aPTT reading. INR upon admission 11/2023 was 3.5 INR this admission was 2.9 but noted on Eliquis  PTA. Patient's baseline suspected to be around 55. Due to liver impairment will transition to bival while on CRRT. In addition to Afib, patient high risk for PE with weight, low EF, and minimal movement.   aPTT again supratherapeutic at 109  (increased) despite dose reductions. No bleeding noted. Off of CRRT since 6/19 PM - however will continue to hold DOAC given rising T-bili and ongoing liver failure suspected due to congestion from known end stage HF.    Goal of Therapy:  aPTT: 80-100, 1.5x baseline of approx 55  Monitor platelets by anticoagulation protocol: Yes   Plan:  Reduce bivalirudin  slightly to 0.055 mg/kg/hr utilizing adjusted body weight given still on upper limit of goal.  Monitor for s/sx of bleeding.  Adjust aPTT targets given baseline aPTT is around 55. Target aPTT per protocol is 50-85 however unreliable target with baseline elevation. Targeting 1.5x baseline aPTT of 55 after discussion w/ provider.  Will check aPTT with AM labs   Randall Call, PharmD, BCPS 01/15/24 9:41 AM

## 2024-01-15 NOTE — Plan of Care (Signed)

## 2024-01-15 NOTE — Progress Notes (Addendum)
 Pt mother's requesting that pt be placed on Bipap. This RT tried putting the mask on the patient and the patient pulled it off and said No. Attempted to place the nasal cannula back in the patients nose and he refused. Explain to the patient that I wasn't going to put the mask back on since he refused but he needed to wear his oxygen . Pt refusing to let me put his oxygen  back on. RN made aware.

## 2024-01-15 NOTE — Progress Notes (Signed)
 Patients mother questioning why patient is not wearing his cpap tonight. I reminded the patients mother that patient refused to wear the machine tonight when I asked the patient. The mother states you dont ask you just do I informed the mother that the patient has autonomy in his care and has the right to refuse treatment just as he refused medication earlier in the shift. I reminded the patients mother that we cannot force care on the patient if the patient is refusing. Called RT to bedside at this time to assist patient at mothers request

## 2024-01-15 NOTE — Progress Notes (Signed)
 NAME:  Phillip Young, MRN:  [id number], DOB:  1986-05-22, LOS: 12 ADMISSION DATE:  01/03/2024, CONSULTATION DATE:  01/03/24 REFERRING MD:  Dr. Patsey, CHIEF COMPLAINT:  SOB/ chills   History of Present Illness:  67 yoM w/PMH as below significant for biventricular HF (EF < 20%), chronic afib on Eliquis , chronic hypotension on midodrine  30mg  TID, ESRD, chronic hypoxic and hypercarbic respiratory failure on NIV, morbid obesity, medical non-compliance, and chronic pain presenting from home after being discharged from [facility] 6/9 with SOB, chills, subjective fever, ongoing poor PO, vomiting today strings of blood but then reported it was with coughing episodes, pain all over, and increased weeping from left thigh blister.  Had BM, unclear on consistency/ color.  Reports last dialysis yesterday.  Initially reports not taking any PO meds today but then said he did this morning.  Non- ambulatory at baseline.  Frequent admits, most recently 5/13-5/22 for recurrent septic shock and MRSE bacteremia due to suspected line complicated by acute on chronic HF and respiratory failure, suspected IE but not a candidate for TEE, discharged on vancomycin  with HD scheduled thru 6/30.  TDC was replaced 5/22 after 2 day line holiday.  Also started on micafungin  w/HD at [facility] after [facility] grew candida species, scheduled thru 6/30.   In ER, temp 99.1, on HFNC at 6L, with SBP into 70's requiring NE support after 500 ml bolus.  Labs significant for Na 129, K 6.3, Cl 89, alk phos 346, AST 84, t.bili 12 (recent 3.4 on 5/14), lactic 3.8, WBC 20.6 (previously 8.1 on 5/22), INR 2.9, CXR showing vascular congestion and increasing congestion and pleural effusions.  BC sent and empirically started on linezolid , flagyl , and cefepime .  Pending CT chest/abd/ pelvis.  Nephrology consulted.  PCCM called for admit.   Pertinent  Medical History   Past Medical History:  Diagnosis Date   Acute on chronic respiratory failure with hypoxia ([name])  04/21/2021   Acute on chronic systolic (congestive) heart failure ([name]) 02/26/2020   Amiodarone  toxicity    Anemia    Biventricular congestive heart failure ([name])    Candida parapsilosis infection 12/26/2023   Chronic hypoxemic respiratory failure ([name])    Class 3 severe obesity due to excess calories with serious comorbidity and body mass index (BMI) of 50.0 to 59.9 in adult 02/26/2020   ESRD on hemodialysis [facility])    Essential hypertension 02/26/2020   GERD without esophagitis 02/26/2020   Hidradenitis suppurativa 02/26/2020   NICM (nonischemic cardiomyopathy) ([name])    Obesity hypoventilation syndrome ([name])    OSA (obstructive sleep apnea)    Pneumonia    Prediabetes 02/26/2020   Significant Hospital Events: Including procedures, antibiotic start and stop dates in addition to other pertinent events   6/10 Admit 6/12 Remains on vasopressors.  Spoke to heart failure.  Not a candidate for advanced therapies.  Not a candidate for tailored hemodynamics.  Recommend a strategy of vasopressor support at fluid removal in an attempt to transition back to IHD. 6/15 patient brought up discussion of limiting some of his care; Palliative re-consulted.  6/16 palliative consulted per patient's request. CT unrevealing for cause of abdominal pain.  Interim History / Subjective:  Still on norepinephrine  now at 16 mcg peripherally.  Mom stayed overnight. Patient refused bipap but then requested and put back on at mom's request.  Ongoing concerns for patient's pain and discomfort with inability to give much pain medication due to shock Minimal po intake One stool yesterday  Objective    Blood pressure ROLLEN)  77/60, pulse (!) 106, temperature (!) 97.2 F (36.2 C), temperature source Axillary, resp. rate (!) 23, height 6' (1.829 m), weight (!) 159.8 kg, SpO2 96%.        Intake/Output Summary (Last 24 hours) at 01/15/2024 0845 Last data filed at 01/15/2024 0700 Gross per 24 hour  Intake 1569.85 ml   Output --  Net 1569.85 ml   Filed Weights   01/09/24 0305 01/13/24 0500 01/15/24 0500  Weight: (!) 163 kg (!) 159.4 kg (!) 159.8 kg   Examination: Chronically ill appearing obese man, laying flat On nasal cannula Breath and lung sounds diminished RRR No wheeze Abdomen obese, soft Non pitting peripheral edema vs lymphedema Faint breathy and soft voice, very weak, unable to sit up independently   Total bilirubin 23.8 Na 123 K 4.8 CO2 19 Calcium  ionized 1.08  Mag today 2.2 rest of bmet pending  RUQ US  with doppler: patent portal veins, likely cirrhosis, underdistended GB with mild wall thickening   Resolved problem list:   Abdominal pain; worry this is related to his constipation Acute metabolic encephalopathy; suspect pain meds may be contributing to this some  Assessment and Plan  Shock ([name])> suspect primarily cardiogenic; sepsis also likely with leukocytosis, but no source has been identified Continue vasopressor support with Levophed , currently on 15 mics with MAP goal 60 Continue high-dose midodrine  at 40 mg 3 times daily Continue IV vancomycin  to treat cellulitis Not a candidate for advanced heart failure therapy  Hyperbilirubinemia-- congestive vs low forward flow state. 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   Imaging has been inconsistent, but possible cirrhosis on recent RUQ US > his picture would fit for cardiac cirrhosis. No signs of obstruction.  Acaculous cholecystitis is possible> no suggestion of this on any of his imaging. Patent hepatic portal veins so no Budd Chiari. Low suspicion for DILI with normal transaminases.  Continue trial of ursodiol   Acute on chronic biventricular congestive heart failure ([name])-combined sysolic & diastolic; known NICM. Echo shows severe RV and LV failure with EF <20%. Non-compliant for many years. Per advanced heart failure, patient is not a candidate for advanced therapy Unable to tolerate GDMT in the setting of hypotension CRRT was  stopped, still requiring vasopressor support, would not tolerate IHD If he is unable to wean off pressors, hospice care will be the only reasonable option.  Right now I think it is highly unlikely he will be able to resume iHD with his current status. Palliative care reconsulted at the patient's request earlier this week.  He remains at high risk for decompensation this admission.   ESRD on hemodialysis CRRT was stopped yesterday Unable to tolerate HD due to high norepinephrine  needs Patient mother was updated  Constipation-- Had BM 6/21. Continue to offer bowel regimen  Obesity hypoventilation syndrome - Intermittently compliant with home BIPAP. Recommend at bedtime bipap; patient intermittently declining but wore when mom was at bedside overnight.   Permanent atrial fibrillation  Amiodarone  intolerant Heart rate is controlled Continue bival Due to hyperbilirubinemia, can switch him to Eliquis  yet Continue digoxin  every 48 hours with.  Rec level checks   Chronic pain disorder Continue pregabalin  Stop oxycodone  and Dilaudid  considering low blood pressure  Mild hyperammonemia Lactulose  BID; he has been refusing  Skin ulceration; recent cellulitis  Has chronic skin ulceration, and he routinely has been refusing dressing changes. He understands he will not be able to make progress if we cannot keep his wounds clean. On examination: multiple areas of covered wound but no drainage or discoloration.  On Vanc- dosing per pharmacy   Long term prognosis remains grim. No good options if he is not able to tolerate iHD. Appreciate Palliative care team's management.   Interdisciplinary rounds this morning with mother, patient, bedside nurse and nephrology.  Will was uncomfortable and asking for the sheet behind him to be adjusted but when myself Dr. Gearline and Randall went to adjust the sheet he became uncomfortable and asked us  to stop.  Expressed to will and his mom that he is not currently  stable for dialysis due to blood pressure.  I told mom that what will likely happen is he will have progressive shortness of breath, confusion, encephalopathy due to hyperbilirubinemia, hyperammonemia.  When it happens I would recommend making him comfortable at that time.  Mom was very frustrated and upset with care team.  Will started stating that the doctors in this hospital tried to kill him.  Mom expressed frustrations about care team man handling him and treating him roughly when doing daily care.  She is upset that the doctors including myself are focusing on his death and telling him he is going to die.  She asks that we not focus on his death and instead focus on his will to live and desire to get better.  He is now asking for physical therapy to come work with them.  I told them that we will call physical therapy on Monday to see if they can reevaluate him for treatment.  Previously he has declined physical therapy and asked them to come back a week later.  Will expresses to be full code and tells his mom not to give up on him.  However he is also having significant pain and discomfort with daily care including voiding, repositioning.  We told him that we will continue to daily assess his appropriateness for dialysis with nephrology team.  Best Practice (right click and Reselect all SmartList Selections daily)   Diet/type: Regular consistency (see orders) DVT prophylaxis SCD, bival gtt Pressure ulcer(s): present on admission  GI prophylaxis: PPI Lines: Dialysis Catheter-pta R TDC (12/15/23) Foley:  N/A Code Status:  full code Last date of multidisciplinary goals of care discussion - daily.   The patient is critically ill due to shock.  Critical care was necessary to treat or prevent imminent or life-threatening deterioration.  Critical care was time spent personally by me on the following activities: development of treatment plan with patient and/or surrogate as well as nursing, discussions  with consultants, evaluation of patient's response to treatment, examination of patient, obtaining history from patient or surrogate, ordering and performing treatments and interventions, ordering and review of laboratory studies, ordering and review of radiographic studies, pulse oximetry, re-evaluation of patient's condition and participation in multidisciplinary rounds.   Critical Care Time devoted to patient care services described in this note is 80 minutes. This time reflects time of care of this signee Phillip Young S Phillip Young . This critical care time does not reflect separately billable procedures or procedure time, teaching time or supervisory time of PA/NP/Med student/Med Resident etc but could involve care discussion time.       Phillip Young Phillip Young Pulmonary and Critical Care Medicine 01/15/2024 9:50 AM  Pager: see AMION  If no response to pager , please call critical care on call (see AMION) until 7pm After 7:00 pm call Elink

## 2024-01-15 NOTE — Progress Notes (Signed)
 PHARMACY - ANTICOAGULATION  Pharmacy Consult for bivalirudin    Indication: atrial fibrillation w/ hx of HIT   Patient Measurements: Height: 6' (182.9 cm) Weight: (!) 159.8 kg (352 lb 4.7 oz) IBW/kg (Calculated) : 77.6 HEPARIN  DW (KG): 125.9  Vital Signs: Temp: 98.1 F (36.7 C) (06/22 1156) Temp Source: Axillary (06/22 1156) BP: 88/49 (06/22 1300) Pulse Rate: 120 (06/22 1145)  Labs: Recent Labs    01/13/24 0426 01/13/24 1442 01/13/24 1643 01/14/24 0818 01/14/24 1527 01/14/24 2204 01/15/24 0410 01/15/24 0826 01/15/24 1424  HGB 9.9*  --   --  9.7*  --   --  10.2* 9.7*  --   HCT 30.6*  --   --  29.2*  --   --  30.0* 29.5*  --   PLT 195  --   --  137*  --   --   --  81*  --   APTT 99*   < >  --  105* 100* 97*  --  109* 103*  CREATININE 2.48*  --  3.16* 3.88*  4.01* 4.29*  --   --   --   --    < > = values in this interval not displayed.    Estimated Creatinine Clearance: 36.5 mL/min (A) (by C-G formula based on SCr of 4.29 mg/dL (H)).  Assessment: 38 y.o. male with h/o Afib, Eliquis  on hold for CRRT, for argatroban  due to h/o HIT. Remains on CRRT. HgB 9.8 and PLTs 281, aPTT downtrending but remains elevated at 61. No baseline aPTT from this admission, T-bili continues to increase to 20.7, noted yellowing of sclera on exam. Argatroban  off since 6/13 around noon. Unclear if aPTT elevated due to liver dysfunction/congestion, underlying coagulopathy, or due to argatroban . Half life of argatroban  even with liver dysfunction approximately 190 minutes so would anticipate this to be out of patient's system given it has been off since 6/13. T-bili during last admission around 3 with no baseline aPTT reading. INR upon admission 11/2023 was 3.5 INR this admission was 2.9 but noted on Eliquis  PTA. Patient's baseline suspected to be around 55. Due to liver impairment will transition to bival while on CRRT. In addition to Afib, patient high risk for PE with weight, low EF, and minimal movement.    aPTT again supratherapeutic at 103 despite dose reductions. No bleeding noted. Off of CRRT since 6/19 PM - however will continue to hold DOAC given rising T-bili and ongoing liver failure suspected due to congestion from known end stage HF.    Goal of Therapy:  aPTT: 80-100, 1.5x baseline of approx 55  Monitor platelets by anticoagulation protocol: Yes   Plan:  Reduce bivalirudin  to 0.04 mg/kg/hr utilizing adjusted body weight and recheck aPTT in 4hrs Monitor for s/sx of bleeding.  Adjust aPTT targets given baseline aPTT is around 55. Target aPTT per protocol is 50-85 however unreliable target with baseline elevation. Targeting 1.5x baseline aPTT of 55 after discussion w/ provider.   Rocky Slade, PharmD, BCPS 01/15/24 3:19 PM

## 2024-01-15 NOTE — Progress Notes (Signed)
 RT at bedside due alarms from home BIPAP. RT arrived at bedside, pt had pulled off BiPAP mask stating  turn it off. RT turned off home unit and placed pt on 5l [place], Spo2 100. Pt stated he did not want go back on BiPAP. Pt tolerating 5L [place] well at this time, no increased WOB noted, home BiPAP unit at bedside, SpO2 100%.      01/15/24 0740  Therapy Vitals  Pulse Rate (!) 107  Resp (!) 27  MEWS Score/Color  MEWS Score 5  MEWS Score Color Red  Respiratory Assessment  Assessment Type Assess only  Respiratory Pattern Regular;Unlabored  Chest Assessment Chest expansion symmetrical  Cough None  Bilateral Breath Sounds Diminished  Oxygen  Therapy/Pulse Ox  O2 Device (S)  Nasal Cannula  O2 Therapy Oxygen  humidified  O2 Flow Rate (L/min) 5 L/min  SpO2 100 %

## 2024-01-15 NOTE — Progress Notes (Addendum)
 Pt requesting to be placed on Bipap, pt placed on his home unit at the bedside with 5L bled in.

## 2024-01-16 ENCOUNTER — Other Ambulatory Visit: Payer: Self-pay

## 2024-01-16 ENCOUNTER — Inpatient Hospital Stay (HOSPITAL_COMMUNITY)

## 2024-01-16 DIAGNOSIS — R579 Shock, unspecified: Secondary | ICD-10-CM | POA: Diagnosis not present

## 2024-01-16 DIAGNOSIS — N186 End stage renal disease: Secondary | ICD-10-CM | POA: Diagnosis not present

## 2024-01-16 DIAGNOSIS — Z992 Dependence on renal dialysis: Secondary | ICD-10-CM | POA: Diagnosis not present

## 2024-01-16 LAB — HEPATIC FUNCTION PANEL
ALT: 37 U/L (ref 0–44)
AST: 84 U/L — ABNORMAL HIGH (ref 15–41)
Albumin: <1.5 g/dL — ABNORMAL LOW (ref 3.5–5.0)
Alkaline Phosphatase: 269 U/L — ABNORMAL HIGH (ref 38–126)
Bilirubin, Direct: 15.2 mg/dL — ABNORMAL HIGH (ref 0.0–0.2)
Indirect Bilirubin: 8.8 mg/dL — ABNORMAL HIGH (ref 0.3–0.9)
Total Bilirubin: 24 mg/dL (ref 0.0–1.2)
Total Protein: 6.5 g/dL (ref 6.5–8.1)

## 2024-01-16 LAB — CBC WITH DIFFERENTIAL/PLATELET
Abs Immature Granulocytes: 0.52 10*3/uL — ABNORMAL HIGH (ref 0.00–0.07)
Basophils Absolute: 0.1 10*3/uL (ref 0.0–0.1)
Basophils Relative: 0 %
Eosinophils Absolute: 0.2 10*3/uL (ref 0.0–0.5)
Eosinophils Relative: 1 %
HCT: 29.8 % — ABNORMAL LOW (ref 39.0–52.0)
Hemoglobin: 10.1 g/dL — ABNORMAL LOW (ref 13.0–17.0)
Immature Granulocytes: 2 %
Lymphocytes Relative: 3 %
Lymphs Abs: 0.6 10*3/uL — ABNORMAL LOW (ref 0.7–4.0)
MCH: 29.9 pg (ref 26.0–34.0)
MCHC: 33.9 g/dL (ref 30.0–36.0)
MCV: 88.2 fL (ref 80.0–100.0)
Monocytes Absolute: 0.6 10*3/uL (ref 0.1–1.0)
Monocytes Relative: 3 %
Neutro Abs: 20 10*3/uL — ABNORMAL HIGH (ref 1.7–7.7)
Neutrophils Relative %: 91 %
Platelets: 53 10*3/uL — ABNORMAL LOW (ref 150–400)
RBC: 3.38 MIL/uL — ABNORMAL LOW (ref 4.22–5.81)
RDW: 21.6 % — ABNORMAL HIGH (ref 11.5–15.5)
WBC: 21.9 10*3/uL — ABNORMAL HIGH (ref 4.0–10.5)
nRBC: 0.1 % (ref 0.0–0.2)

## 2024-01-16 LAB — APTT
aPTT: 102 s — ABNORMAL HIGH (ref 24–36)
aPTT: 91 s — ABNORMAL HIGH (ref 24–36)
aPTT: 96 s — ABNORMAL HIGH (ref 24–36)
aPTT: 81 s — ABNORMAL HIGH (ref 24–36)

## 2024-01-16 LAB — GLUCOSE, CAPILLARY
Glucose-Capillary: 76 mg/dL (ref 70–99)
Glucose-Capillary: 99 mg/dL (ref 70–99)

## 2024-01-16 LAB — BASIC METABOLIC PANEL WITH GFR
Anion gap: 20 — ABNORMAL HIGH (ref 5–15)
BUN: 77 mg/dL — ABNORMAL HIGH (ref 6–20)
CO2: 15 mmol/L — ABNORMAL LOW (ref 22–32)
Calcium: 8.3 mg/dL — ABNORMAL LOW (ref 8.9–10.3)
Chloride: 88 mmol/L — ABNORMAL LOW (ref 98–111)
Creatinine, Ser: 5.86 mg/dL — ABNORMAL HIGH (ref 0.61–1.24)
GFR, Estimated: 12 mL/min — ABNORMAL LOW (ref >60)
Glucose, Bld: 63 mg/dL — ABNORMAL LOW (ref 70–99)
Potassium: 6 mmol/L — ABNORMAL HIGH (ref 3.5–5.1)
Sodium: 123 mmol/L — ABNORMAL LOW (ref 135–145)

## 2024-01-16 LAB — VANCOMYCIN, RANDOM: Vancomycin Rm: 22 ug/mL

## 2024-01-16 LAB — RENAL FUNCTION PANEL
Albumin: <1.5 g/dL — ABNORMAL LOW (ref 3.5–5.0)
Anion gap: 21 — ABNORMAL HIGH (ref 5–15)
BUN: 80 mg/dL — ABNORMAL HIGH (ref 6–20)
CO2: 12 mmol/L — ABNORMAL LOW (ref 22–32)
Calcium: 7.8 mg/dL — ABNORMAL LOW (ref 8.9–10.3)
Chloride: 87 mmol/L — ABNORMAL LOW (ref 98–111)
Creatinine, Ser: 5.89 mg/dL — ABNORMAL HIGH (ref 0.61–1.24)
GFR, Estimated: 12 mL/min — ABNORMAL LOW (ref >60)
Glucose, Bld: 65 mg/dL — ABNORMAL LOW (ref 70–99)
Phosphorus: 6.1 mg/dL — ABNORMAL HIGH (ref 2.5–4.6)
Potassium: 6.2 mmol/L — ABNORMAL HIGH (ref 3.5–5.1)
Sodium: 120 mmol/L — ABNORMAL LOW (ref 135–145)

## 2024-01-16 MED ORDER — ONDANSETRON HCL 4 MG/2ML IJ SOLN
4.0000 mg | Freq: Four times a day (QID) | INTRAMUSCULAR | Status: DC | PRN
  Administered 2024-01-20 – 2024-01-24 (×3): 4 mg via INTRAVENOUS
  Filled 2024-01-16 (×3): qty 2

## 2024-01-16 MED ORDER — HYDROCORTISONE SOD SUC (PF) 100 MG IJ SOLR
100.0000 mg | Freq: Three times a day (TID) | INTRAMUSCULAR | Status: DC
  Filled 2024-01-16: qty 2

## 2024-01-16 MED ORDER — PRISMASOL BGK 4/2.5 32-4-2.5 MEQ/L EC SOLN: Status: DC

## 2024-01-16 MED ORDER — PRISMASOL BGK 2/3.5 32-2-3.5 MEQ/L EC SOLN: Status: DC

## 2024-01-16 MED ORDER — PANTOPRAZOLE SODIUM 40 MG IV SOLR
40.0000 mg | Freq: Two times a day (BID) | INTRAVENOUS | Status: DC
  Administered 2024-01-16 – 2024-01-28 (×24): 40 mg via INTRAVENOUS
  Filled 2024-01-16 (×24): qty 10

## 2024-01-16 MED ORDER — ANTICOAGULANT SODIUM CITRATE 4% (200MG/5ML) IV SOLN: 5.0000 mL | Freq: Every day | Status: DC | PRN

## 2024-01-16 NOTE — Progress Notes (Signed)
 NAME:  Marisa Hufstetler, MRN:  [id number], DOB:  October 02, 1985, LOS: 13 ADMISSION DATE:  01/03/2024, CONSULTATION DATE:  01/03/24 REFERRING MD:  Dr. Patsey, CHIEF COMPLAINT:  SOB/ chills   History of Present Illness:  52 yoM w/PMH as below significant for biventricular HF (EF < 20%), chronic afib on Eliquis , chronic hypotension on midodrine  30mg  TID, ESRD, chronic hypoxic and hypercarbic respiratory failure on NIV, morbid obesity, medical non-compliance, and chronic pain presenting from home after being discharged from [facility] 6/9 with SOB, chills, subjective fever, ongoing poor PO, vomiting today strings of blood but then reported it was with coughing episodes, pain all over, and increased weeping from left thigh blister.  Had BM, unclear on consistency/ color.  Reports last dialysis yesterday.  Initially reports not taking any PO meds today but then said he did this morning.  Non- ambulatory at baseline.  Frequent admits, most recently 5/13-5/22 for recurrent septic shock and MRSE bacteremia due to suspected line complicated by acute on chronic HF and respiratory failure, suspected IE but not a candidate for TEE, discharged on vancomycin  with HD scheduled thru 6/30.  TDC was replaced 5/22 after 2 day line holiday.  Also started on micafungin  w/HD at [facility] after [facility] grew candida species, scheduled thru 6/30.   In ER, temp 99.1, on HFNC at 6L, with SBP into 70's requiring NE support after 500 ml bolus.  Labs significant for Na 129, K 6.3, Cl 89, alk phos 346, AST 84, t.bili 12 (recent 3.4 on 5/14), lactic 3.8, WBC 20.6 (previously 8.1 on 5/22), INR 2.9, CXR showing vascular congestion and increasing congestion and pleural effusions.  BC sent and empirically started on linezolid , flagyl , and cefepime .  Pending CT chest/abd/ pelvis.  Nephrology consulted.  PCCM called for admit.   Pertinent  Medical History   Past Medical History:  Diagnosis Date   Acute on chronic respiratory failure with hypoxia ([name])  04/21/2021   Acute on chronic systolic (congestive) heart failure ([name]) 02/26/2020   Amiodarone  toxicity    Anemia    Biventricular congestive heart failure ([name])    Candida parapsilosis infection 12/26/2023   Chronic hypoxemic respiratory failure ([name])    Class 3 severe obesity due to excess calories with serious comorbidity and body mass index (BMI) of 50.0 to 59.9 in adult 02/26/2020   ESRD on hemodialysis [facility])    Essential hypertension 02/26/2020   GERD without esophagitis 02/26/2020   Hidradenitis suppurativa 02/26/2020   NICM (nonischemic cardiomyopathy) ([name])    Obesity hypoventilation syndrome ([name])    OSA (obstructive sleep apnea)    Pneumonia    Prediabetes 02/26/2020   Significant Hospital Events: Including procedures, antibiotic start and stop dates in addition to other pertinent events   6/10 Admit 6/12 Remains on vasopressors.  Spoke to heart failure.  Not a candidate for advanced therapies.  Not a candidate for tailored hemodynamics.  Recommend a strategy of vasopressor support at fluid removal in an attempt to transition back to IHD. 6/15 patient brought up discussion of limiting some of his care; Palliative re-consulted.  6/16 palliative consulted per patient's request. CT unrevealing for cause of abdominal pain.  Interim History / Subjective:  Still on norepinephrine  double digits Severe scleral icterus Worked with PT  Objective    Blood pressure 96/77, pulse 100, temperature 97.9 F (36.6 C), temperature source Axillary, resp. rate 20, height 6' (1.829 m), weight (!) 160.6 kg, SpO2 97%.    Pressure Support:  [10 cmH20] 10 cmH20   Intake/Output Summary (  Last 24 hours) at 01/16/2024 1302 Last data filed at 01/16/2024 1200 Gross per 24 hour  Intake 1929.84 ml  Output --  Net 1929.84 ml   Filed Weights   01/13/24 0500 01/15/24 0500 01/16/24 0500  Weight: (!) 159.4 kg (!) 159.8 kg (!) 160.6 kg   Examination: Chronically ill appearing obese man, laying  flat On nasal cannula Breath and lung sounds diminished RRR No wheeze Abdomen obese, soft Non pitting peripheral edema vs lymphedema Faint breathy and soft voice, very weak, unable to sit up independently   Labs and images reviewed   Resolved problem list:   Abdominal pain; worry this is related to his constipation Acute metabolic encephalopathy; suspect pain meds may be contributing to this some  Assessment and Plan  Shock ([name])> suspect primarily cardiogenic Continue vasopressor support with Levophed , MAP goal 60 or SBP 90 Continue high-dose midodrine  at 40 mg 3 times daily Continue IV vancomycin  to treat cellulitis Not a candidate for advanced heart failure therapy Patient mother refused solucortef 6/23 for possible adrenal insufficiency   Hyperbilirubinemia-- POA and present 5/25 now worsened, congestive vs low forward flow state. 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 No signs of obstruction.  Acaculous cholecystitis is possible> no suggestion of this on any of his imaging. Patent hepatic portal veins so no Budd Chiari. Low suspicion for DILI with normal transaminases and no obvious culprit.  Continue trial of ursodiol   Acute on chronic biventricular congestive heart failure ([name])-combined sysolic & diastolic; known NICM. Echo shows severe RV and LV failure with EF <20%. Non-compliant for many years. Per advanced heart failure, patient is not a candidate for advanced therapy Unable to tolerate GDMT in the setting of hypotension CRRT was stopped, still requiring vasopressor support, would not tolerate IHD If he is unable to wean off pressors, hospice care will be the only reasonable option.  Right now I think it is highly unlikely he will be able to resume iHD  with his current status. Palliative care reconsulted at the patient's request earlier this week.  He remains at high risk for decompensation this admission.   ESRD on hemodialysis CRRT was stopped, discussed with nephro utility to resume, possible time limited trial again after discussion 6/23 Unable to tolerate HD due to high norepinephrine  needs As such unless hemodynamics improve there is no option for dialysis long term  Constipation-- Had BM 6/21. Continue to offer bowel regimen  Obesity hypoventilation syndrome - Intermittently compliant with home BIPAP. Recommend at bedtime or when sleeping otherwise he use bipap; patient intermittently declining   Permanent atrial fibrillation  Amiodarone  intolerant Heart rate is controlled Continue bival Due to hyperbilirubinemia, can switch him to Eliquis  yet Continue digoxin  every 48 hours  Chronic pain disorder Continue pregabalin  Stop oxycodone  and Dilaudid  considering low blood pressure  Mild hyperammonemia Lactulose  BID; he has been refusing  Skin ulceration; recent cellulitis  Has chronic skin ulceration, and he routinely has been refusing dressing changes. He understands he will not be able to make progress if we cannot keep his wounds clean. On examination: multiple areas of covered wound but no drainage or discoloration.  On Vanc- dosing per pharmacy  Long term prognosis remains grim. No good options if he is not able to tolerate iHD. Appreciate Palliative care team's management.    Best Practice (  right click and Reselect all SmartList Selections daily)   Diet/type: Regular consistency (see orders) DVT prophylaxis SCD, bival gtt Pressure ulcer(s): present on admission  GI prophylaxis: PPI Lines: Dialysis Catheter-pta R TDC (12/15/23) Foley:  N/A Code Status:  full code Last date of multidisciplinary goals of care discussion - discussed with mom bedside 6/23, discussed with CMO Gordy Pina, MD 6/23.   CRITICAL  CARE Performed by: Donnice SAUNDERS Cyndel Griffey   Total critical care time: 35 minutes  Critical care time was exclusive of separately billable procedures and treating other patients.  Critical care was necessary to treat or prevent imminent or life-threatening deterioration.  Critical care was time spent personally by me on the following activities: development of treatment plan with patient and/or surrogate as well as nursing, discussions with consultants, evaluation of patient's response to treatment, examination of patient, obtaining history from patient or surrogate, ordering and performing treatments and interventions, ordering and review of laboratory studies, ordering and review of radiographic studies, pulse oximetry and re-evaluation of patient's condition.      Donnice SAUNDERS Lucciano Vitali [place] [facility] 01/16/2024 1:02 PM  Pager: see AMION  If no response to pager , please call critical care on call (see AMION) until 7pm After 7:00 pm call Elink

## 2024-01-16 NOTE — Progress Notes (Signed)
 Nutrition Follow-up  DOCUMENTATION CODES:  Morbid obesity  INTERVENTION:  Liberalize to regular with 1.2L fluid restriction Encourage PO intake Nepro Shake po BID, each supplement provides 425 kcal and 19 grams protein Renal MVI  NUTRITION DIAGNOSIS:  Increased nutrient needs related to chronic illness as evidenced by estimated needs. - remains applicable  GOAL:  Patient will meet greater than or equal to 90% of their needs - ongoing  MONITOR:  PO intake, Labs, I & O's, Weight trends, Skin  REASON FOR ASSESSMENT:  Consult Assessment of nutrition requirement/status  ASSESSMENT:  Pt with hx of CHF, COPD, GERD, HTN, morbid obesity, and ESRD on HD presented to ED with concern for wound infections after leaving [facility] the day prior. Pt with recent admission in May for similar presentation.  6/10 - Admitted to ICU and started on CRRT 6/19 - CRRT stopped 6/23 - CRRT to be restarted on a trial x 3 days  New consult received for CRRT restarting.  Pt has had extensive discussions over the last week about his refusal of care and poor prognosis. CRRT was stopped on 6/19 and pt has not been able to tolerate iHD due to poor hemodynamics. PMT consulted and pt has made multiple statements over the last week about just wanting to be comfortable. CRRT was restarted today after nephrology confirmed with pt and mother that this would be a 3 day trial to see if pt can become hemodynamically stable enough for iHD.   Discussed intake with RN, states that pt eating some today. ~25% of his meals. Will liberalize to regular with 1.2L fluid restriction to promote PO intake  Admit weight: 180 kg   Current weight: 160.6 kg EDW: 155.5 kg    Intake/Output Summary (Last 24 hours) at 01/16/2024 1602 Last data filed at 01/16/2024 1300 Gross per 24 hour  Intake 1537.35 ml  Output --  Net 1537.35 ml  Net IO Since Admission: -13,167.02 mL [01/16/24 1602]  Drains/Lines: HD Right IJ  Nutritionally  Relevant Medications: Scheduled Meds:  cinacalcet   30 mg Oral Q M,W,F   digoxin   0.125 mg Oral Q48H   NEPRO CARB STEADY  237 mL Oral BID BM   lactulose   30 g Oral BID   levothyroxine   25 mcg Oral Q0600   multivitamin  1 tablet Oral QHS   polyethylene glycol  17 g Oral Daily   senna  1 tablet Oral Daily   sodium zirconium cyclosilicate   10 g Oral TID   ursodiol   300 mg Oral TID   vancomycin     Does not apply See admin instructions   zinc  sulfate  220 mg Oral Daily   Continuous Infusions:  bivalirudin  (ANGIOMAX ) 250 mg in sodium chloride  0.9 % 500 mL (0.5 mg/mL) infusion 0.02 mg/kg/hr (01/16/24 1300)   norepinephrine  (LEVOPHED ) Adult infusion 15 mcg/min (01/16/24 1519)   PRN Meds: docusate sodium , polyethylene glycol  Labs Reviewed: Sodium 123, chloride 88 Potassium 6.0 BUN 77, creatinine 5.86 HgbA1c 5.9% (5/14)  NUTRITION - FOCUSED PHYSICAL EXAM: Defer due to excess fluid and adipose tissue  Diet Order:   Diet Order             Diet regular Room service appropriate? Yes; Fluid consistency: Thin; Fluid restriction: 1200 mL Fluid  Diet effective now                   EDUCATION NEEDS:  No education needs have been identified at this time  Skin:  Skin Assessment: Reviewed RN Assessment Per  WOC Note 6/10 (pt has been refusing dressing changes): Stage 2: - buttocks, scattered Skin Tears: - bilateral inner thighs (presenting with some dark hemorrhagic tissue and some yellow necrotic tissue) - left foot (with necrotic tissue) Full Thickness Wounds: - left distal thigh  Last BM:  6/23 - type 6  Height:  Ht Readings from Last 1 Encounters:  01/04/24 6' (1.829 m)    Weight:  Wt Readings from Last 1 Encounters:  01/16/24 (!) 160.6 kg    Ideal Body Weight:  80.9 kg  BMI:  Body mass index is 48.02 kg/m.  Estimated Nutritional Needs:  Kcal:  2400-2600 kcal/d Protein:  140-160 g/d Fluid:  1L+UOP    Vernell Lukes, RD, LDN Registered Dietitian II Please  reach out via secure chat

## 2024-01-16 NOTE — Significant Event (Signed)
 With decision to reinitiate CRRT no longer have central access for vasopressors.  I discussed with nephrology and they okayed a PICC line which I think is most reasonable in the situation as I do not think this is reversible.  Was recently informed PICC cannot perform this today.  CRRT is not started yet.  I called the mother and discussed safety of vasopressors via IV and recommendation for central line placement.  Discussed risks and benefits of procedure.  Mother did not give consent for central line placement.  She asked how the vasopressors were administered before while CRRT and I informed her this was via a peripheral IV.  She states she wants the patient to have the same treatment as before, vasopressors through peripheral IV.  I discussed that if there is extravasation we have to pause and then place a central line and treat the extravasation which could lead to significant soft tissue damage.  She expressed understanding.  She would be okay with a PICC placement tomorrow if able.  She states she does not want central line and wants via PIV at this time.

## 2024-01-16 NOTE — Progress Notes (Signed)
 PHARMACY - ANTICOAGULATION  Pharmacy Consult for bivalirudin    Indication: atrial fibrillation w/ hx of HIT   Patient Measurements: Height: 6' (182.9 cm) Weight: (!) 160.6 kg (354 lb 0.9 oz) IBW/kg (Calculated) : 77.6 HEPARIN  DW (KG): 125.9  Vital Signs: Temp: 97.3 F (36.3 C) (06/23 0352) Temp Source: Axillary (06/23 0352) BP: 92/66 (06/23 0630) Pulse Rate: 113 (06/23 0630)  Labs: Recent Labs    01/13/24 1442 01/13/24 1643 01/14/24 0818 01/14/24 1527 01/14/24 2204 01/15/24 0410 01/15/24 0826 01/15/24 1424 01/15/24 2036 01/16/24 0527  HGB   < >  --  9.7*  --   --  10.2* 9.7*  --   --   --   HCT  --   --  29.2*  --   --  30.0* 29.5*  --   --   --   PLT  --   --  137*  --   --   --  81*  --   --   --   APTT  --   --  105* 100*   < >  --  109* 103* 105* 102*  CREATININE  --  3.16* 3.88*  4.01* 4.29*  --   --   --   --   --   --    < > = values in this interval not displayed.    Estimated Creatinine Clearance: 36.6 mL/min (A) (by C-G formula based on SCr of 4.29 mg/dL (H)).  Assessment: 38 y.o. male with h/o Afib, Eliquis  on hold for CRRT, for argatroban  due to h/o HIT. Remains on CRRT. HgB 9.8 and PLTs 281, aPTT downtrending but remains elevated at 61. No baseline aPTT from this admission, T-bili continues to increase to 20.7, noted yellowing of sclera on exam. Argatroban  off since 6/13 around noon. Unclear if aPTT elevated due to liver dysfunction/congestion, underlying coagulopathy, or due to argatroban . Half life of argatroban  even with liver dysfunction approximately 190 minutes so would anticipate this to be out of patient's system given it has been off since 6/13. T-bili during last admission around 3 with no baseline aPTT reading. INR upon admission 11/2023 was 3.5 INR this admission was 2.9 but noted on Eliquis  PTA. Patient's baseline suspected to be around 55. Due to liver impairment will transition to bival while on CRRT. In addition to Afib, patient high risk for PE  with weight, low EF, and minimal movement.   aPTT again supratherapeutic at 102 while on bival 0.03 mg/kg/hr despite dose reductions. No bleeding noted. Off of CRRT since 6/19 PM - however will continue to hold DOAC given rising T-bili and ongoing liver failure suspected due to congestion from known end stage HF.  HgB 9.7 and PLTs 81.   Goal of Therapy:  aPTT: 80-100, 1.5x baseline of approx 55  Monitor platelets by anticoagulation protocol: Yes   Plan:  Reduce bivalirudin  to 0.02 mg/kg/hr utilizing adjusted body weight and recheck aPTT in 4hrs Monitor for s/sx of bleeding.  Adjust aPTT targets given baseline aPTT is around 55. Target aPTT per protocol is 50-85 however unreliable target with baseline elevation. Targeting 1.5x baseline aPTT of 55 after discussion w/ provider. T-bili remains stable from last checks.   Powell Blush, PharmD, BCCCP  01/16/24 7:16 AM

## 2024-01-16 NOTE — Progress Notes (Addendum)
 [facility] NEPHROLOGY PROGRESS NOTE  Summary: Pt is a 38 y.o. yo male with past medical history of biventricular heart failure EF less than 20, chronic A-fib, ESRD on HD, chronic hypotension on high-dose midodrine , nonadherence with outpatient HD and treatment, recently at [facility] presented with shock and volume overload.  Assessment/ Plan # Shock, cardiogenic:  12/06/23 EF < 20%. Sepsis less likely with neg cultures, abx off. Not a candidate for advanced therapies  # ESRD:  on HD since 01/2021.  Was [facility] MWF, noncompliant with outpatient HD with chronic hyperkalemia.  Was on CRRT 6/10- 6/19.  He remains on significant doses of levophed  and midodrine  with unfavorable hemodynamics.  There is unacceptable risk of harm to the pt to attempt iHD at this time.  His levophed  dose remains high.  He remains too unstable for iHD. Long discussion about EOL issues w/ pt's mother. Plan is for re-trial of CRRT x 3 days. She understands after that we will do iHD if he is stable enough, and if after that he cannot do regular iHD she understands that he will not make it.   # Anemia of ESRD: Dosed Aranesp .  Transfuse as needed.  # Chronic hypotension/volume: On Levophed  and midodrine  for shock   # a fib: Has been running in the 110s-130s  #HIT: avoiding heparin   #Elevated Tbili: with synthetic dysfunction  # Possible calciphylaxis: bilat anterior thighs (did not see posterior wounds) has sig areas of skin hardening as well as serpentine areas of skin darkening w/ some associated sloughing, looks like calciphylaxis. Avoid Ca/ vit D products.    Subjective:   Seen in ICU bed Pt not responsive  Objective Vital signs in last 24 hours: Vitals:   01/16/24 1045 01/16/24 1049 01/16/24 1100 01/16/24 1115  BP: (!) 123/108 95/60 (!) 86/63 (!) 88/70  Pulse: (!) 115 (!) 105 100 (!) 111  Resp: 16  (!) 21 (!) 24  Temp:      TempSrc:      SpO2: 100% 97% 94% 94%  Weight:      Height:        Physical Exam: General:NAD, lying flat, sleepy but arousable Heart:RRR, s1s2 nl Lungs: Bilateral distant breath sound Abdomen:soft, mildly distended Extremities: 1+ bilat LE edema, upper thighs show sig skin hardening w/ serpentine areas of darkening w/ periodic sloughing in the same areas; pt not able to respond, pt's mother says the lesions are very painful Dialysis Access: [facility]  Kaydin Labo D Danner Paulding 01/16/2024,11:38 AM  LOS: 13 days

## 2024-01-16 NOTE — Progress Notes (Signed)
 Pharmacy Antibiotic Note  Phillip Young is a 38 y.o. male admitted on 01/03/2024 with MRSE presumed endocarditis.  Pharmacy has been consulted for vancomycin  dosing. Patient ESRD, has been on CRRT for approximately 1 week due to hypotension. CRRT stopped 6/19 around 17:30. Vancomycin  random this morning resulted at 22. Plan to resume CRRT at some point this afternoon.   Plan: Hold vancomycin  for now.  Will get a vancomycin  random in the AM - if okay will likely resume vancomycin  1000mg  q24h. Patients last trough was on vancomycin  1250mg  q24h and was supra-therapeutic at 22. Trial of 3 days of CRRT per nephro.   Height: 6' (182.9 cm) Weight: (!) 160.6 kg (354 lb 0.9 oz) IBW/kg (Calculated) : 77.6  Temp (24hrs), Avg:97.6 F (36.4 C), Min:96.8 F (36 C), Max:98.6 F (37 C)  Recent Labs  Lab 01/11/24 2143 01/12/24 0423 01/12/24 1641 01/13/24 0426 01/13/24 1643 01/14/24 0818 01/14/24 1527 01/15/24 0826 01/16/24 0527  WBC  --   --   --  21.1*  --  21.0*  --  20.9* 21.9*  CREATININE  --    < > 1.81* 2.48* 3.16* 3.88*  4.01* 4.29*  --   --   VANCOTROUGH 22*  --   --   --   --   --   --   --   --   VANCORANDOM  --   --   --  31  --   --   --   --  22   < > = values in this interval not displayed.    Estimated Creatinine Clearance: 36.6 mL/min (A) (by C-G formula based on SCr of 4.29 mg/dL (H)).    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, SRA positive   Metoprolol  Other (See Comments)    near syncope   Other Swelling and Other (See Comments)    Steroids Fluid seeping out of legs Nebulizer(unknown name of neb) elevated pressure    Tramadol      unknown    Antimicrobials this admission: Vancomycin  5/14 >> EOT 6/30  Microbiology  results: 6/10 BCx: NGTD x 5d  6/10 MRSA PCR: negative   Thank you for allowing pharmacy to be a part of this patient's care.  Powell Blush, PharmD, BCCCP  01/16/2024 8:39 AM

## 2024-01-16 NOTE — Progress Notes (Signed)
 Pt with large projectile emesis at this time. Pt to remain off bipap at this time. Pt on 5L HFNC SpO2 96%. RT will continue to be available as needed.

## 2024-01-16 NOTE — Plan of Care (Signed)
 Titration norepi to achieve SBP goal of 90. Mom visited. Rass -1. 1 large BM.  Problem: Education: Goal: Knowledge of General Education information will improve Description: Including pain rating scale, medication(s)/side effects and non-pharmacologic comfort measures Outcome: Progressing   Problem: Health Behavior/Discharge Planning: Goal: Ability to manage health-related needs will improve Outcome: Progressing   Problem: Clinical Measurements: Goal: Ability to maintain clinical measurements within normal limits will improve Outcome: Progressing Goal: Will remain free from infection Outcome: Progressing Goal: Diagnostic test results will improve Outcome: Progressing Goal: Respiratory complications will improve Outcome: Progressing Goal: Cardiovascular complication will be avoided Outcome: Progressing   Problem: Activity: Goal: Risk for activity intolerance will decrease Outcome: Progressing   Problem: Nutrition: Goal: Adequate nutrition will be maintained Outcome: Progressing   Problem: Coping: Goal: Level of anxiety will decrease Outcome: Progressing   Problem: Elimination: Goal: Will not experience complications related to bowel motility Outcome: Progressing Goal: Will not experience complications related to urinary retention Outcome: Progressing   Problem: Pain Managment: Goal: General experience of comfort will improve and/or be controlled Outcome: Progressing   Problem: Safety: Goal: Ability to remain free from injury will improve Outcome: Progressing   Problem: Skin Integrity: Goal: Risk for impaired skin integrity will decrease Outcome: Progressing

## 2024-01-16 NOTE — Progress Notes (Signed)
 Pressor requirements levo at 16. Called mother Phillip Young about central line access and states would not want us  to place and refusing picc line overnight but okay with it being placed during day tomorrow. Educated on concern running levo on peripheral iv and risk extravasation and is okay.  JD Emilio RIGGERS [facility] 01/16/2024, 8:44 PM  Please see [email or web address] for pager details.  From 7A-7P if no response, please call [phone]. After hours, please call ELink [phone].

## 2024-01-16 NOTE — Progress Notes (Signed)
 PHARMACY - ANTICOAGULATION  Pharmacy Consult for bivalirudin    Indication: atrial fibrillation w/ hx of HIT   Patient Measurements: Height: 6' (182.9 cm) Weight: (!) 160.6 kg (354 lb 0.9 oz) IBW/kg (Calculated) : 77.6 HEPARIN  DW (KG): 125.9  Vital Signs: Temp: 97.3 F (36.3 C) (06/23 1546) Temp Source: Oral (06/23 1546) BP: 83/65 (06/23 1819) Pulse Rate: 120 (06/23 1819)  Labs: Recent Labs    01/14/24 0818 01/14/24 1527 01/14/24 2204 01/15/24 0410 01/15/24 0826 01/15/24 1424 01/16/24 0527 01/16/24 1006 01/16/24 1212 01/16/24 1541 01/16/24 1751  HGB 9.7*  --   --  10.2* 9.7*  --  10.1*  --   --   --   --   HCT 29.2*  --   --  30.0* 29.5*  --  29.8*  --   --   --   --   PLT 137*  --   --   --  81*  --  53*  --   --   --   --   APTT 105* 100*   < >  --  109*   < > 102*  --  91*  --  96*  CREATININE 3.88*  4.01* 4.29*  --   --   --   --   --  5.86*  --  5.89*  --    < > = values in this interval not displayed.    Estimated Creatinine Clearance: 26.6 mL/min (A) (by C-G formula based on SCr of 5.89 mg/dL (H)).  Assessment: 38 y.o. male with h/o Afib, Eliquis  on hold for CRRT, for argatroban  due to h/o HIT. Remains on CRRT. HgB 9.8 and PLTs 281, aPTT downtrending but remains elevated at 61. No baseline aPTT from this admission, T-bili continues to increase to 20.7, noted yellowing of sclera on exam. Argatroban  off since 6/13 around noon. Unclear if aPTT elevated due to liver dysfunction/congestion, underlying coagulopathy, or due to argatroban . Half life of argatroban  even with liver dysfunction approximately 190 minutes so would anticipate this to be out of patient's system given it has been off since 6/13. T-bili during last admission around 3 with no baseline aPTT reading. INR upon admission 11/2023 was 3.5 INR this admission was 2.9 but noted on Eliquis  PTA. Patient's baseline suspected to be around 55. Due to liver impairment will transition to bival while on CRRT. In addition  to Afib, patient high risk for PE with weight, low EF, and minimal movement.   aPTT therapeutic x 2 checks (91, 96) while on bival 0.02mg /kg/hr. Patient will be going back on CRRT tonight. No issues with bleeding per RN.  Goal of Therapy:  aPTT: 80-100, 1.5x baseline of approx 55  Monitor platelets by anticoagulation protocol: Yes   Plan:  Continue bivalirudin  at 0.02 mg/kg/hr utilizing adjusted body weight - will check aPTT at midnight since CRRT resuming soon Ancipitate patient may need escalating doses, previously therapeutic on 0.09mg /kg/hr while on CRRT in the past.  Monitor for s/sx of bleeding.  Adjust aPTT targets given baseline aPTT is around 55. Target aPTT per protocol is 50-85 however unreliable target with baseline elevation. Targeting 1.5x baseline aPTT of 55 after discussion w/ provider. T-bili remains stable from last checks.   Powell Blush, PharmD, BCCCP  01/16/24 6:47 PM

## 2024-01-16 NOTE — Progress Notes (Signed)
 Called mother -Jylan Loeza for PICC placement consent. Explained to her the importance of having a PICC line but she's adamant about having PICC or Central placement  , she is agreeable for PIV. RN aware.

## 2024-01-16 NOTE — Progress Notes (Addendum)
 eLink Physician-Brief Progress Note Patient Name: Phillip Young DOB: 06-01-1986 MRN: [id number]   Date of Service  01/16/2024  HPI/Events of Note  Had a large bout of projectile vomiting with streaks of blood while on Angiomax  with CRRT  Running peripheral norepinephrine  at 16 mcg  Ongoing nausea/vomiting  eICU Interventions  Will place NG tube for decompression, obtain KUB post. Add PPI, hold Bival for now  Add antiemetics as needed.  QTc 443  Maintain peripheral norepinephrine  per family request    2256 -abdominal film most consistent with bowel gas pattern found in SBO.  Will obtain CT abdomen with contrast.  Trend lactate.  Hold all enteral meds.  Advance NG tube 10 cm and then okay to use for LIS  Intervention Category Minor Interventions: Routine modifications to care plan (e.g. PRN medications for pain, fever)  Breydon Senters 01/16/2024, 8:25 PM

## 2024-01-16 NOTE — Progress Notes (Signed)
 PHARMACY - ANTICOAGULATION  Pharmacy Consult for bivalirudin    Indication: atrial fibrillation w/ hx of HIT   Patient Measurements: Height: 6' (182.9 cm) Weight: (!) 160.6 kg (354 lb 0.9 oz) IBW/kg (Calculated) : 77.6 HEPARIN  DW (KG): 125.9  Vital Signs: Temp: 97.9 F (36.6 C) (06/23 1140) Temp Source: Axillary (06/23 1140) BP: 96/77 (06/23 1248) Pulse Rate: 100 (06/23 1248)  Labs: Recent Labs    01/14/24 0818 01/14/24 1527 01/14/24 2204 01/15/24 0410 01/15/24 0826 01/15/24 1424 01/15/24 2036 01/16/24 0527 01/16/24 1006 01/16/24 1212  HGB 9.7*  --   --  10.2* 9.7*  --   --  10.1*  --   --   HCT 29.2*  --   --  30.0* 29.5*  --   --  29.8*  --   --   PLT 137*  --   --   --  81*  --   --  53*  --   --   APTT 105* 100*   < >  --  109*   < > 105* 102*  --  91*  CREATININE 3.88*  4.01* 4.29*  --   --   --   --   --   --  5.86*  --    < > = values in this interval not displayed.    Estimated Creatinine Clearance: 26.8 mL/min (A) (by C-G formula based on SCr of 5.86 mg/dL (H)).  Assessment: 38 y.o. male with h/o Afib, Eliquis  on hold for CRRT, for argatroban  due to h/o HIT. Remains on CRRT. HgB 9.8 and PLTs 281, aPTT downtrending but remains elevated at 61. No baseline aPTT from this admission, T-bili continues to increase to 20.7, noted yellowing of sclera on exam. Argatroban  off since 6/13 around noon. Unclear if aPTT elevated due to liver dysfunction/congestion, underlying coagulopathy, or due to argatroban . Half life of argatroban  even with liver dysfunction approximately 190 minutes so would anticipate this to be out of patient's system given it has been off since 6/13. T-bili during last admission around 3 with no baseline aPTT reading. INR upon admission 11/2023 was 3.5 INR this admission was 2.9 but noted on Eliquis  PTA. Patient's baseline suspected to be around 55. Due to liver impairment will transition to bival while on CRRT. In addition to Afib, patient high risk for PE  with weight, low EF, and minimal movement.   aPTT therapeutic at 91 while on bival 0.02mg /kg/hr. Patient will be going back on CRRT this afternoon.   Goal of Therapy:  aPTT: 80-100, 1.5x baseline of approx 55  Monitor platelets by anticoagulation protocol: Yes   Plan:  Continue bivalirudin  to 0.02 mg/kg/hr utilizing adjusted body weight and recheck aPTT in 4hrs.  Ancipitate patient may need escalating doses, previously therapeutic on 0.09mg /kg/hr while on CRRT in the past.  Monitor for s/sx of bleeding.  Adjust aPTT targets given baseline aPTT is around 55. Target aPTT per protocol is 50-85 however unreliable target with baseline elevation. Targeting 1.5x baseline aPTT of 55 after discussion w/ provider. T-bili remains stable from last checks.   Powell Blush, PharmD, BCCCP  01/16/24 12:50 PM

## 2024-01-16 NOTE — Progress Notes (Signed)
 Physical Therapy Treatment Patient Details Name: Phillip Young MRN: [id number] DOB: November 01, 1985 Today's Date: 01/16/2024   History of Present Illness 38 yo male adm  01/03/24 for SOB, chills, and incr weeping from lt thigh blister. Pt was dc'd from [facility] on same date. Pt with shock  from decompensated HF. CRRT 6/10-6/19. PMH - ESRD on HD, chronic resp failure, morbid obesity, NICM, OSA, chronic pain, chronic hypotension.    PT Comments  Pt pleasant and very willing to attempt mobility. Pt with induration of thighs and hips with pain and pt able to assist with bending knees to roll bil for linen change and wound dressings. Pt willing to initiate tilt today tolerating 23 degrees with BP tolerating. Pt positioned in semichair end of session for eating, could not tolerate fully upright. Will continue to pursue tilting trials and upright positioning in sitting.  HR 95-123 BP 131/117 (123) supine Initial til 85/61 (69) HR 117 Return to supine 85/63 (72) HR 122 SPO2 92-93% on 6L    If plan is discharge home, recommend the following: Two people to help with walking and/or transfers;Two people to help with bathing/dressing/bathroom   Can travel by private vehicle        Equipment Recommendations  None recommended by PT    Recommendations for Other Services       Precautions / Restrictions Precautions Precautions: Fall;Other (comment) Recall of Precautions/Restrictions: Impaired Precaution/Restrictions Comments: many wounds and not allowing staff to assist him with hygiene consistently     Mobility  Bed Mobility Overal bed mobility: Needs Assistance Bed Mobility: Rolling Rolling: Max assist, +2 for physical assistance         General bed mobility comments: max +2 assist to roll bil to change pad and dress wounds. pt able to slide to [facility] with min assist with use of head rails and trendelenburg    Transfers Overall transfer level: Needs assistance                 General  transfer comment: tilt to 23degrees with physical blocking at knees. Pt maintains left hip external rotation throughout but able to engage quads repeatedly to push against foot board for 5 min    Ambulation/Gait                   Stairs             Wheelchair Mobility     Tilt Bed    Modified Rankin (Stroke Patients Only)       Balance                                            Communication Communication Communication: Impaired Factors Affecting Communication: Difficulty expressing self  Cognition Arousal: Alert Behavior During Therapy: Flat affect   PT - Cognitive impairments: Difficult to assess                       PT - Cognition Comments: oriented to place not day, following commands with delay, seems to lack awareness of wounds and function Following commands: Impaired Following commands impaired: Follows one step commands inconsistently, Follows one step commands with increased time    Cueing Cueing Techniques: Verbal cues, Gestural cues, Tactile cues  Exercises General Exercises - Lower Extremity Quad Sets: AROM, Other (comment) (repeated quad sets at 23 degree tilt for 5 min. max  hold 30 sec)    General Comments        Pertinent Vitals/Pain Pain Assessment Pain Score: 8  Pain Location: wounds, left thigh the worst Pain Descriptors / Indicators: Moaning, Guarding, Grimacing, Sharp, Sore Pain Intervention(s): Limited activity within patient's tolerance, Monitored during session, Repositioned, RN gave pain meds during session    Home Living                          Prior Function            PT Goals (current goals can now be found in the care plan section) Progress towards PT goals: Progressing toward goals    Frequency    Min 1X/week      PT Plan      Co-evaluation              AM-PAC PT 6 Clicks Mobility   Outcome Measure  Help needed turning from your back to your side  while in a flat bed without using bedrails?: Total Help needed moving from lying on your back to sitting on the side of a flat bed without using bedrails?: Total Help needed moving to and from a bed to a chair (including a wheelchair)?: Total Help needed standing up from a chair using your arms (e.g., wheelchair or bedside chair)?: Total Help needed to walk in hospital room?: Total Help needed climbing 3-5 steps with a railing? : Total 6 Click Score: 6    End of Session Equipment Utilized During Treatment: Oxygen  Activity Tolerance: Patient tolerated treatment well Patient left: in bed;with call bell/phone within reach;with nursing/sitter in room Nurse Communication: Mobility status;Need for lift equipment;Precautions PT Visit Diagnosis: Other abnormalities of gait and mobility (R26.89);Muscle weakness (generalized) (M62.81);Difficulty in walking, not elsewhere classified (R26.2);Pain     Time: 9241-9171 PT Time Calculation (min) (ACUTE ONLY): 30 min  Charges:    $Therapeutic Activity: 23-37 mins PT General Charges $$ ACUTE PT VISIT: 1 Visit                     Lenoard SQUIBB, PT Acute Rehabilitation Services Office: [phone]    Lenoard NOVAK Burma Ketcher 01/16/2024, 10:24 AM

## 2024-01-16 NOTE — Progress Notes (Signed)
 PHARMACY - ANTICOAGULATION CONSULT NOTE  Pharmacy Consult for bivalirudin  Indication: atrial fibrillation  Labs: Recent Labs    01/14/24 0818 01/14/24 1527 01/14/24 2204 01/15/24 0410 01/15/24 0826 01/15/24 1424 01/16/24 0527 01/16/24 1006 01/16/24 1212 01/16/24 1541 01/16/24 1751 01/16/24 2314  HGB 9.7*  --   --  10.2* 9.7*  --  10.1*  --   --   --   --   --   HCT 29.2*  --   --  30.0* 29.5*  --  29.8*  --   --   --   --   --   PLT 137*  --   --   --  81*  --  53*  --   --   --   --   --   APTT 105* 100*   < >  --  109*   < > 102*  --  91*  --  96* 81*  CREATININE 3.88*  4.01* 4.29*  --   --   --   --   --  5.86*  --  5.89*  --   --    < > = values in this interval not displayed.  Assessment/Plan:  38yo male therapeutic on bivalirudin  though now at lower end of goal after RRT resumed. Will continue infusion at current rate and monitor daily PTT.  Marvetta Dauphin, PharmD, BCPS 01/16/2024 11:47 PM

## 2024-01-17 ENCOUNTER — Other Ambulatory Visit: Payer: Self-pay

## 2024-01-17 ENCOUNTER — Inpatient Hospital Stay (HOSPITAL_COMMUNITY)

## 2024-01-17 DIAGNOSIS — N186 End stage renal disease: Secondary | ICD-10-CM | POA: Diagnosis not present

## 2024-01-17 DIAGNOSIS — Z992 Dependence on renal dialysis: Secondary | ICD-10-CM | POA: Diagnosis not present

## 2024-01-17 DIAGNOSIS — R579 Shock, unspecified: Secondary | ICD-10-CM | POA: Diagnosis not present

## 2024-01-17 LAB — MAGNESIUM: Magnesium: 2.3 mg/dL (ref 1.7–2.4)

## 2024-01-17 LAB — HEPATIC FUNCTION PANEL
ALT: 40 U/L (ref 0–44)
AST: 88 U/L — ABNORMAL HIGH (ref 15–41)
Albumin: <1.5 g/dL — ABNORMAL LOW (ref 3.5–5.0)
Alkaline Phosphatase: 214 U/L — ABNORMAL HIGH (ref 38–126)
Bilirubin, Direct: 15.6 mg/dL — ABNORMAL HIGH (ref 0.0–0.2)
Indirect Bilirubin: 8.1 mg/dL — ABNORMAL HIGH (ref 0.3–0.9)
Total Bilirubin: 23.7 mg/dL (ref 0.0–1.2)
Total Protein: 6.4 g/dL — ABNORMAL LOW (ref 6.5–8.1)

## 2024-01-17 LAB — GLUCOSE, CAPILLARY: Glucose-Capillary: 103 mg/dL — ABNORMAL HIGH (ref 70–99)

## 2024-01-17 LAB — RENAL FUNCTION PANEL
Albumin: <1.5 g/dL — ABNORMAL LOW (ref 3.5–5.0)
Albumin: <1.5 g/dL — ABNORMAL LOW (ref 3.5–5.0)
Anion gap: 16 — ABNORMAL HIGH (ref 5–15)
Anion gap: 19 — ABNORMAL HIGH (ref 5–15)
BUN: 69 mg/dL — ABNORMAL HIGH (ref 6–20)
BUN: 49 mg/dL — ABNORMAL HIGH (ref 6–20)
CO2: 16 mmol/L — ABNORMAL LOW (ref 22–32)
CO2: 14 mmol/L — ABNORMAL LOW (ref 22–32)
Calcium: 8.2 mg/dL — ABNORMAL LOW (ref 8.9–10.3)
Calcium: 8.2 mg/dL — ABNORMAL LOW (ref 8.9–10.3)
Chloride: 92 mmol/L — ABNORMAL LOW (ref 98–111)
Chloride: 96 mmol/L — ABNORMAL LOW (ref 98–111)
Creatinine, Ser: 5.13 mg/dL — ABNORMAL HIGH (ref 0.61–1.24)
Creatinine, Ser: 3.57 mg/dL — ABNORMAL HIGH (ref 0.61–1.24)
GFR, Estimated: 14 mL/min — ABNORMAL LOW (ref >60)
GFR, Estimated: 21 mL/min — ABNORMAL LOW (ref >60)
Glucose, Bld: 72 mg/dL (ref 70–99)
Glucose, Bld: 77 mg/dL (ref 70–99)
Phosphorus: 5 mg/dL — ABNORMAL HIGH (ref 2.5–4.6)
Phosphorus: 4.7 mg/dL — ABNORMAL HIGH (ref 2.5–4.6)
Potassium: 5.9 mmol/L — ABNORMAL HIGH (ref 3.5–5.1)
Potassium: 5.4 mmol/L — ABNORMAL HIGH (ref 3.5–5.1)
Sodium: 124 mmol/L — ABNORMAL LOW (ref 135–145)
Sodium: 129 mmol/L — ABNORMAL LOW (ref 135–145)

## 2024-01-17 LAB — CBC
HCT: 28.9 % — ABNORMAL LOW (ref 39.0–52.0)
Hemoglobin: 9.9 g/dL — ABNORMAL LOW (ref 13.0–17.0)
MCH: 29.9 pg (ref 26.0–34.0)
MCHC: 34.3 g/dL (ref 30.0–36.0)
MCV: 87.3 fL (ref 80.0–100.0)
Platelets: 61 10*3/uL — ABNORMAL LOW (ref 150–400)
RBC: 3.31 MIL/uL — ABNORMAL LOW (ref 4.22–5.81)
RDW: 22.2 % — ABNORMAL HIGH (ref 11.5–15.5)
WBC: 28.3 10*3/uL — ABNORMAL HIGH (ref 4.0–10.5)
nRBC: 0.1 % (ref 0.0–0.2)

## 2024-01-17 LAB — LACTIC ACID, PLASMA
Lactic Acid, Venous: 3.9 mmol/L (ref 0.5–1.9)
Lactic Acid, Venous: 3.7 mmol/L (ref 0.5–1.9)

## 2024-01-17 LAB — DIGOXIN LEVEL: Digoxin Level: 0.8 ng/mL (ref 0.8–2.0)

## 2024-01-17 LAB — APTT: aPTT: 63 s — ABNORMAL HIGH (ref 24–36)

## 2024-01-17 LAB — VANCOMYCIN, RANDOM: Vancomycin Rm: 17 ug/mL

## 2024-01-17 MED ORDER — DIGOXIN 0.25 MG/ML IJ SOLN
0.1250 mg | INTRAMUSCULAR | Status: DC
  Administered 2024-01-17 – 2024-01-19 (×2): 0.125 mg via INTRAVENOUS
  Filled 2024-01-17 (×2): qty 0.5

## 2024-01-17 MED ORDER — SODIUM THIOSULFATE 250 MG/ML IV SOLN
25.0000 g | INTRAVENOUS | Status: DC
  Administered 2024-01-17 – 2024-01-22 (×3): 25 g via INTRAVENOUS
  Filled 2024-01-17 (×4): qty 100

## 2024-01-17 MED ORDER — VANCOMYCIN HCL IN DEXTROSE 1-5 GM/200ML-% IV SOLN
1000.0000 mg | INTRAVENOUS | Status: DC
  Administered 2024-01-17 – 2024-01-19 (×3): 1000 mg via INTRAVENOUS
  Filled 2024-01-17 (×3): qty 200

## 2024-01-17 NOTE — TOC Progression Note (Signed)
 Transition of Care [facility]) - Progression Note    Patient Details  Name: Phillip Young MRN: [id number] Date of Birth: November 03, 1985  [facility] CM/SW Contact  Lauraine FORBES Saa, LCSW Phone Number: 01/17/2024, 1:11 PM  Clinical Narrative:     1:11 PM Per progressions, PCMT is to conduct GOC meeting tomorrow with patient and patient's mother.  Expected Discharge Plan: Long Term Acute Care (LTAC) Barriers to Discharge: Continued Medical Work up  Expected Discharge Plan and Services       Living arrangements for the past 2 months: Apartment                             HH Agency: [facility]         Social Determinants of Health (SDOH) Interventions SDOH Screenings   Food Insecurity: No Food Insecurity (01/08/2024)  Recent Concern: Food Insecurity - Food Insecurity Present (11/01/2023)  Housing: High Risk (01/08/2024)  Transportation Needs: Unmet Transportation Needs (01/08/2024)  Utilities: At Risk (01/08/2024)  Alcohol  Screen: Low Risk  (10/06/2023)  Depression (PHQ2-9): High Risk (10/06/2023)  Financial Resource Strain: Low Risk  (10/06/2023)  Physical Activity: Insufficiently Active (10/06/2023)  Social Connections: Socially Isolated (10/06/2023)  Stress: No Stress Concern Present (10/06/2023)  Tobacco Use: Medium Risk (12/14/2023)  Health Literacy: Adequate Health Literacy (10/06/2023)    Readmission Risk Interventions    12/15/2023    2:07 PM 10/13/2023   11:27 AM 09/23/2023    5:05 PM  Readmission Risk Prevention Plan  Transportation Screening Complete Complete Complete  Medication Review (RN Care Manager) Referral to Pharmacy Referral to Pharmacy Referral to Pharmacy  PCP or Specialist appointment within 3-5 days of discharge Complete  Complete  HRI or Home Care Consult Complete Complete Complete  SW Recovery Care/Counseling Consult Complete Complete Complete  Palliative Care Screening Not Applicable Not Applicable Not Applicable   Skilled Nursing Facility Not Applicable Not Applicable Not Applicable

## 2024-01-17 NOTE — Progress Notes (Signed)
 At bedside for PICC placement. Upon assessment, no suitable vessels identified for PICC or midline insertion. Bilateral upper arms assessed. No cephalic or basilic vessel identified on either arm. Very poor vasculature. Left brachial vessel >40% vessel occupancy. Unable to place PICC line at this time. Central line recommended for further access needs. RN made aware.

## 2024-01-17 NOTE — Significant Event (Signed)
 Notified approximately 11:30 AM of vascular access team unable to find adequate vasculature for PICC placement.  I met with the mother at bedside shortly thereafter to discuss alternative IV access.  Patient is encephalopathic cannot participate in care at this time.  Advised my recommendation to place a central line either in the neck, chest, or groin to safely deliver vasopressors, notably norepinephrine  at this time.  I also advised my recommendation for an arterial line placement given wide range and blood pressures and no improvement despite escalating doses of pressors to more accurately monitor his blood pressure and titrate off vasopressors.  She states that this will not go all much longer and there is not much longer he will be on CRRT.  She declines to give consent for either procedure.  She states she does not want more invasive things done at this time.  We discussed the risks and benefits of central line placement and the risks of ongoing vasopressor use through a peripheral IV even at higher doses than normal.  She expressed understanding of these risks and again reiterates desire to continue medications through peripheral IV.  Current norepinephrine  dose at that time was 20 mcg.  I stated this is the maximum amount we can administer and further increases are not possible in no further additional vasopressors will be added.  She expressed understanding that if the blood pressure gets lower there is nothing more we can do.

## 2024-01-17 NOTE — Plan of Care (Signed)
  Problem: Education: Goal: Knowledge of General Education information will improve Description: Including pain rating scale, medication(s)/side effects and non-pharmacologic comfort measures Outcome: Not Progressing   Problem: Health Behavior/Discharge Planning: Goal: Ability to manage health-related needs will improve Outcome: Not Progressing   Problem: Clinical Measurements: Goal: Ability to maintain clinical measurements within normal limits will improve Outcome: Not Progressing Goal: Will remain free from infection Outcome: Not Progressing Goal: Diagnostic test results will improve Outcome: Not Progressing Goal: Respiratory complications will improve Outcome: Not Progressing Goal: Cardiovascular complication will be avoided Outcome: Not Progressing   Problem: Activity: Goal: Risk for activity intolerance will decrease Outcome: Not Progressing   Problem: Nutrition: Goal: Adequate nutrition will be maintained Outcome: Not Progressing   Problem: Coping: Goal: Level of anxiety will decrease Outcome: Not Progressing   Problem: Elimination: Goal: Will not experience complications related to bowel motility Outcome: Not Progressing   Problem: Pain Managment: Goal: General experience of comfort will improve and/or be controlled Outcome: Not Progressing   Problem: Safety: Goal: Ability to remain free from injury will improve Outcome: Not Progressing   Problem: Skin Integrity: Goal: Risk for impaired skin integrity will decrease Outcome: Not Progressing

## 2024-01-17 NOTE — Progress Notes (Signed)
 OT Cancellation Note  Patient Details Name: Phillip Young MRN: [id number] DOB: 1985/07/29   Cancelled Treatment:    Reason Eval/Treat Not Completed: Patient's level of consciousness (lethargic, does not respond to therapist upon arrival. Will follow up for OT tx as schedule permits)  Phillip Young, OTD, OTR/L [facility] [phone]   Phillip Young 01/17/2024, 2:21 PM

## 2024-01-17 NOTE — Progress Notes (Signed)
 [facility] NEPHROLOGY PROGRESS NOTE  Summary: Pt is a 38 y.o. yo male with past medical history of biventricular heart failure EF less than 20, chronic A-fib, ESRD on HD, chronic hypotension on high-dose midodrine , nonadherence with outpatient HD and treatment, recently at [facility] presented with shock and volume overload.  Subjective:   Seen in ICU bed Pt not responsive  Objective Vital signs in last 24 hours: Vitals:   01/17/24 1204 01/17/24 1215 01/17/24 1245 01/17/24 1300  BP: (!) 90/50 (!) 84/55 (!) 81/53 (!) 87/69  Pulse: (!) 121 (!) 116 (!) 104 (!) 117  Resp: (!) 22 (!) 23 (!) 21 (!) 25  Temp:      TempSrc:      SpO2: 98% 99% 98% 94%  Weight:      Height:       Physical Exam: General:NAD, lying flat, not responding much verbally Heart:RRR, s1s2 nl Lungs: Bilateral distant breath sound Abdomen:soft, mildly distended Extremities: 1+ bilat LE edema, upper thighs show sig skin hardening w/ serpentine areas of darkening w/ periodic sloughing in the same areas; pt not able to respond, pt's mother says the lesions are very painful Dialysis Access: TDC  OP HD: MWF [facility] 4.5h   B400  180kg  TDC  NO heparin  (h/o HIT) Last OP HD 5/09, post wt 190kg  Assessment/ Plan # Shock, cardiogenic:  12/06/23 EF < 20%. Sepsis less likely with neg cultures, abx off. Not a candidate for advanced therapies  # ESRD:  on HD since 01/2021. [facility] MWF, noncompliant with outpatient HD with chronic hyperkalemia.  Was on CRRT 6/10- 6/19, then was dc'd. He remained too unstable for iHD. Long discussion yesterday about EOL issues w/ pt's mother. Plan is for re-trial of CRRT time-limited (3 days 6/23 -> 6/26). She understands after that we will do iHD if he is stable enough, and if not stable for iHD she understands that he will not make it.   # Anemia of ESRD: cont weekly darge sq 150 mcg . Transfuse as needed.  # Chronic hypotension/volume: on Levophed  15- 20 micrograms/min  # a fib: Has  been running in the 110s-130s  #HIT: avoiding heparin   #Elevated Tbili: with synthetic dysfunction  # Suspected calciphylaxis: bilat anterior thighs (did not see posterior wounds) has sig areas of skin hardening as well as serpentine areas of skin darkening w/ some associated sloughing, looks like calciphylaxis. Avoid Ca/ vit D products. Will start IV sodium thiosulfate tiw. Very poor prognosis w/ significant calciphylaxis on background of cardiogenic shock.    Myer Fret  MD  [facility] 01/17/2024, 1:31 PM  Recent Labs  Lab 01/16/24 0527 01/16/24 1006 01/16/24 1541 01/17/24 0347 01/17/24 0348 01/17/24 0736  HGB 10.1*  --   --   --   --  9.9*  ALBUMIN   --    < > <1.5* <1.5* <1.5*  --   CALCIUM   --    < > 7.8*  --  8.2*  --   PHOS  --   --  6.1*  --  5.0*  --   CREATININE  --    < > 5.89*  --  5.13*  --   K  --    < > 6.2*  --  5.9*  --    < > = values in this interval not displayed.    Inpatient medications:  Chlorhexidine  Gluconate Cloth  6 each Topical Daily   Darbepoetin Alfa   150 mcg Subcutaneous Q7 days   digoxin   0.125 mg Intravenous QODAY   feeding supplement (NEPRO CARB STEADY)  237 mL Oral BID BM   leptospermum manuka honey  1 Application Topical Daily   liver oil-zinc  oxide   Topical BID   mouth rinse  15 mL Mouth Rinse 4 times per day   pantoprazole  (PROTONIX ) IV  40 mg Intravenous Q12H    anticoagulant sodium citrate      norepinephrine  (LEVOPHED ) Adult infusion 20 mcg/min (01/17/24 1300)   PrismaSol  BGK 2/3.5 1,500 mL/hr at 01/17/24 1146   prismasol  BGK 4/2.5 400 mL/hr at 01/17/24 1035   prismasol  BGK 4/2.5 400 mL/hr at 01/17/24 1033   vancomycin  Stopped (01/17/24 1145)   anticoagulant sodium citrate , camphor-menthol , HYDROmorphone  (DILAUDID ) injection, ondansetron  (ZOFRAN ) IV, mouth rinse

## 2024-01-17 NOTE — Progress Notes (Signed)
 NAME:  Phillip Young, MRN:  [id number], DOB:  08/23/1985, LOS: 14 ADMISSION DATE:  01/03/2024, CONSULTATION DATE:  01/03/24 REFERRING MD:  Dr. Patsey, CHIEF COMPLAINT:  SOB/ chills   History of Present Illness:  57 yoM w/PMH as below significant for biventricular HF (EF < 20%), chronic afib on Eliquis , chronic hypotension on midodrine  30mg  TID, ESRD, chronic hypoxic and hypercarbic respiratory failure on NIV, morbid obesity, medical non-compliance, and chronic pain presenting from home after being discharged from [facility] 6/9 with SOB, chills, subjective fever, ongoing poor PO, vomiting today strings of blood but then reported it was with coughing episodes, pain all over, and increased weeping from left thigh blister.  Had BM, unclear on consistency/ color.  Reports last dialysis yesterday.  Initially reports not taking any PO meds today but then said he did this morning.  Non- ambulatory at baseline.  Frequent admits, most recently 5/13-5/22 for recurrent septic shock and MRSE bacteremia due to suspected line complicated by acute on chronic HF and respiratory failure, suspected IE but not a candidate for TEE, discharged on vancomycin  with HD scheduled thru 6/30.  TDC was replaced 5/22 after 2 day line holiday.  Also started on micafungin  w/HD at [facility] after [facility] grew candida species, scheduled thru 6/30.   In ER, temp 99.1, on HFNC at 6L, with SBP into 70's requiring NE support after 500 ml bolus.  Labs significant for Na 129, K 6.3, Cl 89, alk phos 346, AST 84, t.bili 12 (recent 3.4 on 5/14), lactic 3.8, WBC 20.6 (previously 8.1 on 5/22), INR 2.9, CXR showing vascular congestion and increasing congestion and pleural effusions.  BC sent and empirically started on linezolid , flagyl , and cefepime .  Pending CT chest/abd/ pelvis.  Nephrology consulted.  PCCM called for admit.   Pertinent  Medical History   Past Medical History:  Diagnosis Date   Acute on chronic respiratory failure with hypoxia ([name])  04/21/2021   Acute on chronic systolic (congestive) heart failure ([name]) 02/26/2020   Amiodarone  toxicity    Anemia    Biventricular congestive heart failure ([name])    Candida parapsilosis infection 12/26/2023   Chronic hypoxemic respiratory failure ([name])    Class 3 severe obesity due to excess calories with serious comorbidity and body mass index (BMI) of 50.0 to 59.9 in adult 02/26/2020   ESRD on hemodialysis [facility])    Essential hypertension 02/26/2020   GERD without esophagitis 02/26/2020   Hidradenitis suppurativa 02/26/2020   NICM (nonischemic cardiomyopathy) ([name])    Obesity hypoventilation syndrome ([name])    OSA (obstructive sleep apnea)    Pneumonia    Prediabetes 02/26/2020   Significant Hospital Events: Including procedures, antibiotic start and stop dates in addition to other pertinent events   6/10 Admit 6/12 Remains on vasopressors.  Spoke to heart failure.  Not a candidate for advanced therapies.  Not a candidate for tailored hemodynamics.  Recommend a strategy of vasopressor support at fluid removal in an attempt to transition back to IHD. 6/15 patient brought up discussion of limiting some of his care; Palliative re-consulted.  6/16 palliative consulted per patient's request. CT unrevealing for cause of abdominal pain. 6/23 nephro decided to restart CRRT despite prior nephro stopping. Remains hypotensive, mother did not consent central line, PICC delayed, peripheral pressors and resume CRRT, developed ileus vs SBO, NG placed to LIWS  Interim History / Subjective:  Still on norepinephrine  double digits via peripheral, PICC yet to be placed BP low and all over the place Severe scleral icterus  Objective    Blood pressure (!) 111/94, pulse (!) 121, temperature 97.9 F (36.6 C), temperature source Oral, resp. rate (!) 33, height 6' (1.829 m), weight (!) 159.7 kg, SpO2 98%.        Intake/Output Summary (Last 24 hours) at 01/17/2024 0850 Last data filed at 01/17/2024  0800 Gross per 24 hour  Intake 1408.49 ml  Output 1100 ml  Net 308.49 ml   Filed Weights   01/15/24 0500 01/16/24 0500 01/17/24 0500  Weight: (!) 159.8 kg (!) 160.6 kg (!) 159.7 kg   Examination: Chronically ill appearing obese man, laying flat On nasal cannula Breath and lung sounds diminished RRR No wheeze Abdomen obese, soft Non pitting peripheral edema vs lymphedema Faint breathy and soft voice, very weak, unable to sit up independently, alert but disoriented   Labs and images reviewed   Resolved problem list:    Assessment and Plan  Shock ([name])> suspect primarily cardiogenic Continue vasopressor support with Levophed , MAP goal 60 or SBP 90 Continue high-dose midodrine  at 40 mg 3 times daily - on hold as NPO and NG To LIWS Continue IV vancomycin  to treat cellulitis Not a candidate for advanced heart failure therapy Patient mother refused solucortef 6/23 for possible adrenal insufficiency   Hyperbilirubinemia-- POA and present 5/25 now worsened, congestive vs low forward flow state. 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 No signs of obstruction.  Acaculous cholecystitis is possible> no suggestion of this on any of his imaging. Patent hepatic portal veins so no Budd Chiari. Low suspicion for DILI with normal transaminases and no obvious culprit.  Continue trial of ursodiol  - on hold for NG to LIWS  Acute on chronic biventricular congestive heart failure ([name])-combined sysolic & diastolic; known NICM. Echo shows severe RV and LV failure with EF <20%. Non-compliant for many years. Per advanced heart failure, patient is not a candidate for advanced therapy Unable to tolerate GDMT in the setting of hypotension CRRT was stopped, still requiring vasopressor  support, would not tolerate IHD, CRRT resumed time limited trial 6/23 for 3 days to end AM 6/25 If he is unable to wean off pressors, hospice care will be the only reasonable option.  Right now I think it is highly unlikely he will be able to resume iHD with his current status. Palliative care reconsulted aand following.  He remains at high risk for decompensation this admission.   ESRD on hemodialysis CRRT was stopped, discussed with nephro utility to resume, time limited trial again after discussion 6/23 Unable to tolerate HD due to high norepinephrine  needs As such unless hemodynamics improve there is no option for dialysis long term  Constipation SBO on plain film 6/23 Had BM 6/21. Continue to offer bowel regimen NG placed overnight to 6/24, LIWS, repeat plain film in PM  Obesity hypoventilation syndrome - Intermittently compliant with home BIPAP. Recommend at bedtime or when sleeping otherwise he use bipap; patient intermittently declining   Permanent atrial fibrillation  Amiodarone  intolerant Heart rate is controlled Bival stopped with bleeding wounds 6/24 Due to hyperbilirubinemia, can switch him to Eliquis   Continue digoxin  every 48 hours  Chronic pain disorder Continue pregabalin  Stop oxycodone  and Dilaudid  considering low blood pressure  Mild hyperammonemia Lactulose  BID; he has been refusing  Skin ulceration; recent cellulitis  Has chronic skin ulceration, and he  routinely has been refusing dressing changes. He understands he will not be able to make progress if we cannot keep his wounds clean. On examination: multiple areas of covered wound but no drainage or discoloration.  On Vanc- dosing per pharmacy  Long term prognosis remains grim. No good options if he is not able to tolerate iHD. Appreciate Palliative care team's management. Most likely outcome is in hospital death. GOC remain full code yet at times refuse appropriate and aggressive medical care as recommended.  Not amenable currently to discussion about imminent death.    Best Practice (right click and Reselect all SmartList Selections daily)   Diet/type: Regular consistency (see orders) DVT prophylaxis SCD, bival gtt Pressure ulcer(s): present on admission  GI prophylaxis: PPI Lines: Dialysis Catheter-pta R TDC (12/15/23) Foley:  N/A Code Status:  full code Last date of multidisciplinary goals of care discussion - discussed with mom bedside 6/24, discussed with CMO Gordy Pina, MD 6/23.   CRITICAL CARE Performed by: Donnice SAUNDERS Chanler Mendonca   Total critical care time: 36 minutes  Critical care time was exclusive of separately billable procedures and treating other patients.  Critical care was necessary to treat or prevent imminent or life-threatening deterioration.  Critical care was time spent personally by me on the following activities: development of treatment plan with patient and/or surrogate as well as nursing, discussions with consultants, evaluation of patient's response to treatment, examination of patient, obtaining history from patient or surrogate, ordering and performing treatments and interventions, ordering and review of laboratory studies, ordering and review of radiographic studies, pulse oximetry and re-evaluation of patient's condition.      Donnice SAUNDERS Taneeka Curtner [facility] 01/17/2024 8:50 AM  Pager: see AMION  If no response to pager , please call critical care on call (see AMION) until 7pm After 7:00 pm call Elink

## 2024-01-17 NOTE — Plan of Care (Signed)
   Medical records reviewed including progress notes, labs, imaging. Discussed with MD.  Goal during last conversation with PMT on 6/18 was for one more week of full scope treatment and full code status, then re-visit GOC discussion again if he has failed to improve. Mother is aware of this plan and will let me know of her preferred time for meeting tomorrow on 6/25.  Patient is aware of poor prognosis and potential for death as a result of his illness. He has struggled with both making decision to transition to comfort focused care and complying with all aspects of aggressive life-prolonging care. PMT will continue to support.   Thank you for your referral and allowing PMT to assist in [name] care.   Maija Biggers, PA-C Palliative Medicine Team  Team Phone # [phone]   NO CHARGE

## 2024-01-17 NOTE — Progress Notes (Signed)
 PHARMACY - ANTICOAGULATION  Pharmacy Consult for bivalirudin    Indication: atrial fibrillation w/ hx of HIT   Patient Measurements: Height: 6' (182.9 cm) Weight: (!) 159.7 kg (352 lb 1.2 oz) IBW/kg (Calculated) : 77.6 HEPARIN  DW (KG): 125.9  Vital Signs: Temp: 97.9 F (36.6 C) (06/24 0712) Temp Source: Oral (06/24 0712) BP: 81/56 (06/24 0845) Pulse Rate: 110 (06/24 0845)  Labs: Recent Labs    01/15/24 0826 01/15/24 1424 01/16/24 0527 01/16/24 1006 01/16/24 1212 01/16/24 1541 01/16/24 1751 01/16/24 2314 01/17/24 0347 01/17/24 0348 01/17/24 0736  HGB 9.7*  --  10.1*  --   --   --   --   --   --   --  9.9*  HCT 29.5*  --  29.8*  --   --   --   --   --   --   --  28.9*  PLT 81*  --  53*  --   --   --   --   --   --   --  61*  APTT 109*   < > 102*  --    < >  --  96* 81* 63*  --   --   CREATININE  --   --   --  5.86*  --  5.89*  --   --   --  5.13*  --    < > = values in this interval not displayed.    Estimated Creatinine Clearance: 30.5 mL/min (A) (by C-G formula based on SCr of 5.13 mg/dL (H)).  Assessment: 38 y.o. male with h/o Afib, Eliquis  on hold for CRRT, for argatroban  due to h/o HIT. Remains on CRRT. HgB 9.8 and PLTs 281, aPTT downtrending but remains elevated at 61. No baseline aPTT from this admission, T-bili continues to increase to 20.7, noted yellowing of sclera on exam. Argatroban  off since 6/13 around noon. Unclear if aPTT elevated due to liver dysfunction/congestion, underlying coagulopathy, or due to argatroban . Half life of argatroban  even with liver dysfunction approximately 190 minutes so would anticipate this to be out of patient's system given it has been off since 6/13. T-bili during last admission around 3 with no baseline aPTT reading. INR upon admission 11/2023 was 3.5 INR this admission was 2.9 but noted on Eliquis  PTA. Patient's baseline suspected to be around 55. Due to liver impairment will transition to bival while on CRRT. In addition to Afib,  patient high risk for PE with weight, low EF, and minimal movement.   aPTT 63 this morning, bival held since last night around 20:00 due to streaks of blood after vomiting. PLTs down to 61 and HgB stable at 9.9.   Goal of Therapy:  aPTT: 80-100, 1.5x baseline of approx 55  Monitor platelets by anticoagulation protocol: Yes   Plan:  Hold bivalirudin  for today per discussion with CCM.  F/u s/sx for additional bleeding and H/H.   Powell Blush, PharmD, BCCCP  01/17/24 8:55 AM

## 2024-01-17 NOTE — Progress Notes (Addendum)
 Pharmacy Antibiotic Note  Phillip Young is a 38 y.o. male admitted on 01/03/2024 with MRSE presumed endocarditis.  Pharmacy has been consulted for vancomycin  dosing. Patient ESRD, was off CRRT over the weekend and obtained several vancomycin  random to ensure patient remained therapeutic. CRRT restarted 6/23 PM. Has not had any pauses or issues since resuming.   Plan: Vancomycin  random this morning 17.  Will resume vancomycin  1000mg  q24h, obtain trough prior to 3rd dose if patient remains on CRRT.  Trial of 3 days of CRRT per nephro.  EOT per ID 6/30 for presumed MRSE endocarditis.   Height: 6' (182.9 cm) Weight: (!) 159.7 kg (352 lb 1.2 oz) IBW/kg (Calculated) : 77.6  Temp (24hrs), Avg:97.8 F (36.6 C), Min:97.3 F (36.3 C), Max:98 F (36.7 C)  Recent Labs  Lab 01/11/24 2143 01/12/24 0423 01/13/24 0426 01/13/24 1643 01/14/24 0818 01/14/24 1527 01/15/24 0826 01/16/24 0527 01/16/24 1006 01/16/24 1541 01/16/24 2314 01/17/24 0347 01/17/24 0348  WBC  --   --  21.1*  --  21.0*  --  20.9* 21.9*  --   --   --   --   --   CREATININE  --    < > 2.48*   < > 3.88*  4.01* 4.29*  --   --  5.86* 5.89*  --   --  5.13*  LATICACIDVEN  --   --   --   --   --   --   --   --   --   --  3.9* 3.7*  --   VANCOTROUGH 22*  --   --   --   --   --   --   --   --   --   --   --   --   VANCORANDOM  --    < > 31  --   --   --   --  22  --   --   --  17  --    < > = values in this interval not displayed.    Estimated Creatinine Clearance: 30.5 mL/min (A) (by C-G formula based on SCr of 5.13 mg/dL (H)).    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, SRA positive   Metoprolol  Other (See Comments)    near syncope   Other Swelling and Other (See Comments)     Steroids Fluid seeping out of legs Nebulizer(unknown name of neb) elevated pressure    Tramadol      unknown    Dose Adjustments this admission:  6/24 VR 17 - resume vancomycin  1000mg  q24h, CRRT restarted 6/23 PM  6/23 VR 22 - vancomycin  continued to be held due to holding RRT  6/20 VR 31 - vancomycin  continued to be held due to holding RRT  6/18 VT 22 - on vancomycin  1250mg  q24h, reduced to 1000mg  24h  6/15 VT 26 - on vancomycin  1750mg  q24h, reduced to 1250mg  q24h   Antimicrobials this admission: Vancomycin  5/14 >> EOT 6/30  Microbiology results: 6/10 BCx: NGTD x 5d  6/10 MRSA PCR: negative   Thank you for allowing pharmacy to be a part of this patient's care.  Powell Blush, PharmD, BCCCP  01/17/2024 7:56 AM

## 2024-01-18 ENCOUNTER — Inpatient Hospital Stay (HOSPITAL_COMMUNITY)

## 2024-01-18 DIAGNOSIS — A419 Sepsis, unspecified organism: Secondary | ICD-10-CM

## 2024-01-18 DIAGNOSIS — N186 End stage renal disease: Secondary | ICD-10-CM | POA: Diagnosis not present

## 2024-01-18 DIAGNOSIS — I959 Hypotension, unspecified: Secondary | ICD-10-CM

## 2024-01-18 DIAGNOSIS — R579 Shock, unspecified: Secondary | ICD-10-CM | POA: Diagnosis not present

## 2024-01-18 DIAGNOSIS — Z992 Dependence on renal dialysis: Secondary | ICD-10-CM | POA: Diagnosis not present

## 2024-01-18 LAB — RENAL FUNCTION PANEL
Albumin: <1.5 g/dL — ABNORMAL LOW (ref 3.5–5.0)
Albumin: <1.5 g/dL — ABNORMAL LOW (ref 3.5–5.0)
Anion gap: 12 (ref 5–15)
Anion gap: 14 (ref 5–15)
BUN: 40 mg/dL — ABNORMAL HIGH (ref 6–20)
BUN: 31 mg/dL — ABNORMAL HIGH (ref 6–20)
CO2: 19 mmol/L — ABNORMAL LOW (ref 22–32)
CO2: 20 mmol/L — ABNORMAL LOW (ref 22–32)
Calcium: 7.9 mg/dL — ABNORMAL LOW (ref 8.9–10.3)
Calcium: 8.3 mg/dL — ABNORMAL LOW (ref 8.9–10.3)
Chloride: 98 mmol/L (ref 98–111)
Chloride: 98 mmol/L (ref 98–111)
Creatinine, Ser: 2.86 mg/dL — ABNORMAL HIGH (ref 0.61–1.24)
Creatinine, Ser: 2.26 mg/dL — ABNORMAL HIGH (ref 0.61–1.24)
GFR, Estimated: 28 mL/min — ABNORMAL LOW (ref >60)
GFR, Estimated: 37 mL/min — ABNORMAL LOW (ref >60)
Glucose, Bld: 94 mg/dL (ref 70–99)
Glucose, Bld: 89 mg/dL (ref 70–99)
Phosphorus: 3.9 mg/dL (ref 2.5–4.6)
Phosphorus: 3.6 mg/dL (ref 2.5–4.6)
Potassium: 4.6 mmol/L (ref 3.5–5.1)
Potassium: 4.1 mmol/L (ref 3.5–5.1)
Sodium: 129 mmol/L — ABNORMAL LOW (ref 135–145)
Sodium: 132 mmol/L — ABNORMAL LOW (ref 135–145)

## 2024-01-18 LAB — CBC
HCT: 25.8 % — ABNORMAL LOW (ref 39.0–52.0)
Hemoglobin: 8.9 g/dL — ABNORMAL LOW (ref 13.0–17.0)
MCH: 29.3 pg (ref 26.0–34.0)
MCHC: 34.5 g/dL (ref 30.0–36.0)
MCV: 84.9 fL (ref 80.0–100.0)
Platelets: 107 10*3/uL — ABNORMAL LOW (ref 150–400)
RBC: 3.04 MIL/uL — ABNORMAL LOW (ref 4.22–5.81)
RDW: 21.9 % — ABNORMAL HIGH (ref 11.5–15.5)
WBC: 30.9 10*3/uL — ABNORMAL HIGH (ref 4.0–10.5)
nRBC: 0.1 % (ref 0.0–0.2)

## 2024-01-18 LAB — HEPATIC FUNCTION PANEL
ALT: 40 U/L (ref 0–44)
AST: 76 U/L — ABNORMAL HIGH (ref 15–41)
Albumin: <1.5 g/dL — ABNORMAL LOW (ref 3.5–5.0)
Alkaline Phosphatase: 198 U/L — ABNORMAL HIGH (ref 38–126)
Bilirubin, Direct: 12.8 mg/dL — ABNORMAL HIGH (ref 0.0–0.2)
Total Bilirubin: 20.3 mg/dL (ref 0.0–1.2)
Total Protein: 5.7 g/dL — ABNORMAL LOW (ref 6.5–8.1)

## 2024-01-18 LAB — LACTIC ACID, PLASMA: Lactic Acid, Venous: 2.5 mmol/L (ref 0.5–1.9)

## 2024-01-18 LAB — MAGNESIUM: Magnesium: 2.2 mg/dL (ref 1.7–2.4)

## 2024-01-18 LAB — APTT: aPTT: 60 s — ABNORMAL HIGH (ref 24–36)

## 2024-01-18 LAB — GLUCOSE, CAPILLARY: Glucose-Capillary: 91 mg/dL (ref 70–99)

## 2024-01-18 MED ORDER — NOREPINEPHRINE 16 MG/250ML-% IV SOLN
0.0000 ug/min | INTRAVENOUS | Status: DC
  Administered 2024-01-18: 20 ug/min via INTRAVENOUS
  Administered 2024-01-18: 32 ug/min via INTRAVENOUS
  Administered 2024-01-19: 16 ug/min via INTRAVENOUS
  Administered 2024-01-21: 7 ug/min via INTRAVENOUS
  Administered 2024-01-22: 22 ug/min via INTRAVENOUS
  Administered 2024-01-22: 24 ug/min via INTRAVENOUS
  Administered 2024-01-23: 25 ug/min via INTRAVENOUS
  Administered 2024-01-23: 30 ug/min via INTRAVENOUS
  Administered 2024-01-23: 36 ug/min via INTRAVENOUS
  Administered 2024-01-24 (×2): 33 ug/min via INTRAVENOUS
  Administered 2024-01-25: 24 ug/min via INTRAVENOUS
  Administered 2024-01-26: 34 ug/min via INTRAVENOUS
  Administered 2024-01-26: 40 ug/min via INTRAVENOUS
  Administered 2024-01-26: 8 ug/min via INTRAVENOUS
  Administered 2024-01-27 – 2024-01-28 (×5): 40 ug/min via INTRAVENOUS
  Filled 2024-01-18 (×20): qty 250

## 2024-01-18 MED ORDER — NOREPINEPHRINE 4 MG/250ML-% IV SOLN
INTRAVENOUS | Status: AC
  Filled 2024-01-18: qty 250

## 2024-01-18 MED ORDER — ACETAMINOPHEN 10 MG/ML IV SOLN
1000.0000 mg | Freq: Once | INTRAVENOUS | Status: AC
  Filled 2024-01-18: qty 100

## 2024-01-18 MED ORDER — NOREPINEPHRINE 4 MG/250ML-% IV SOLN: 0.0000 ug/min | INTRAVENOUS | Status: DC

## 2024-01-18 NOTE — Progress Notes (Signed)
 PHARMACY - ANTICOAGULATION  Pharmacy Consult for bivalirudin    Indication: atrial fibrillation w/ hx of HIT   Patient Measurements: Height: 6' (182.9 cm) Weight: (!) 162.8 kg (358 lb 14.5 oz) IBW/kg (Calculated) : 77.6 HEPARIN  DW (KG): 125.9  Vital Signs: Temp: 98.3 F (36.8 C) (06/25 0732) Temp Source: Axillary (06/25 0732) BP: 93/61 (06/25 1200) Pulse Rate: 112 (06/25 1200)  Labs: Recent Labs    01/16/24 0527 01/16/24 1006 01/16/24 2314 01/17/24 0347 01/17/24 0348 01/17/24 0736 01/17/24 1721 01/18/24 0459 01/18/24 0833  HGB 10.1*  --   --   --   --  9.9*  --   --   --   HCT 29.8*  --   --   --   --  28.9*  --   --   --   PLT 53*  --   --   --   --  61*  --   --   --   APTT 102*   < > 81* 63*  --   --   --   --  60*  CREATININE  --    < >  --   --  5.13*  --  3.57* 2.86*  --    < > = values in this interval not displayed.    Estimated Creatinine Clearance: 55.3 mL/min (A) (by C-G formula based on SCr of 2.86 mg/dL (H)).  Assessment: 38 y.o. male with h/o Afib, Eliquis  on hold for CRRT, for argatroban  due to h/o HIT. Remains on CRRT. HgB 9.8 and PLTs 281, aPTT downtrending but remains elevated at 61. No baseline aPTT from this admission, T-bili continues to increase to 20.7, noted yellowing of sclera on exam. Argatroban  off since 6/13 around noon. Unclear if aPTT elevated due to liver dysfunction/congestion, underlying coagulopathy, or due to argatroban . Half life of argatroban  even with liver dysfunction approximately 190 minutes so would anticipate this to be out of patient's system given it has been off since 6/13. T-bili during last admission around 3 with no baseline aPTT reading. INR upon admission 11/2023 was 3.5 INR this admission was 2.9 but noted on Eliquis  PTA. Patient's baseline suspected to be around 55. Due to liver impairment will transition to bival while on CRRT. In addition to Afib, patient high risk for PE with weight, low EF, and minimal movement.   aPTT  60 this morning, bival has been off since 6/23 @ 20:00. Half life bival while on dialysis approximately 3.5 hours. aPTT yesterday morning was 63. Anticipate this elevation is due to patient's liver dysfunction, T-bili is downtrending.   Patient has had no bleeding since event on 6/24 AM. HgB down from 9.9 to 8.9 today -   Goal of Therapy:  aPTT: 80-100, 1.5x baseline of approx 55-60, lower end range w/ recent bleeding  Monitor platelets by anticoagulation protocol: Yes   Plan:  Patient had been therapeutic on 0.02mg /kg/hr (adjusted body weight) bival prior to resuming CRRT. While on CRRT previously therapeutic at 0.09mg /kg/hr adjusted body weight. Blood flow rate has remained stable through both CRRT sessions (212mL/min).  Given recent bleeding event - will start at lower than previously required dosing @ 0.05mg /kg/hr utilizing adjusted body weight and adjust based on aPTT.  Daily CBC.  F/u s/sx for additional bleeding.   Powell Blush, PharmD, BCCCP  01/18/24 1:48 PM

## 2024-01-18 NOTE — TOC Progression Note (Signed)
 Transition of Care [facility]) - Progression Note    Patient Details  Name: Phillip Young MRN: [id number] Date of Birth: February 20, 1986  [facility] CM/SW Contact  Lauraine FORBES Saa, LCSW Phone Number: 01/18/2024, 3:23 PM  Clinical Narrative:     3:23 PM Per chart review, PCMT continues to follow patient and are to conduct another GOC meeting tomorrow with patient and patient's mother.  Expected Discharge Plan: Long Term Acute Care (LTAC) Barriers to Discharge: Continued Medical Work up  Expected Discharge Plan and Services       Living arrangements for the past 2 months: Apartment                             HH Agency: [facility]         Social Determinants of Health (SDOH) Interventions SDOH Screenings   Food Insecurity: No Food Insecurity (01/08/2024)  Recent Concern: Food Insecurity - Food Insecurity Present (11/01/2023)  Housing: High Risk (01/08/2024)  Transportation Needs: Unmet Transportation Needs (01/08/2024)  Utilities: At Risk (01/08/2024)  Alcohol  Screen: Low Risk  (10/06/2023)  Depression (PHQ2-9): High Risk (10/06/2023)  Financial Resource Strain: Low Risk  (10/06/2023)  Physical Activity: Insufficiently Active (10/06/2023)  Social Connections: Socially Isolated (10/06/2023)  Stress: No Stress Concern Present (10/06/2023)  Tobacco Use: Medium Risk (12/14/2023)  Health Literacy: Adequate Health Literacy (10/06/2023)    Readmission Risk Interventions    12/15/2023    2:07 PM 10/13/2023   11:27 AM 09/23/2023    5:05 PM  Readmission Risk Prevention Plan  Transportation Screening Complete Complete Complete  Medication Review (RN Care Manager) Referral to Pharmacy Referral to Pharmacy Referral to Pharmacy  PCP or Specialist appointment within 3-5 days of discharge Complete  Complete  HRI or Home Care Consult Complete Complete Complete  SW Recovery Care/Counseling Consult Complete Complete Complete  Palliative Care Screening Not  Applicable Not Applicable Not Applicable  Skilled Nursing Facility Not Applicable Not Applicable Not Applicable

## 2024-01-18 NOTE — Progress Notes (Signed)
 eLink Physician-Brief Progress Note Patient Name: Exander Shaul DOB: 1986/01/02 MRN: [id number]   Date of Service  01/18/2024  HPI/Events of Note  Patient attempting to pull out NG tube, mom concerned he might try to pull out central line as well.  eICU Interventions  I explained to mom why NG tube needs to stay in place with his ongoing ileus. Mittens and soft restraints ordered.        Henretter Piekarski U Elizibeth Breau 01/18/2024, 8:39 PM

## 2024-01-18 NOTE — Progress Notes (Signed)
 [facility] NEPHROLOGY PROGRESS NOTE  Summary: Pt is a 38 y.o. yo male with past medical history of biventricular heart failure EF less than 20, chronic A-fib, ESRD on HD, chronic hypotension on high-dose midodrine , nonadherence with outpatient HD and treatment, recently at [facility] presented with shock and volume overload.  Subjective:   Seen in ICU bed Pt half-awake, not responding much verbally  Objective Vital signs in last 24 hours: Vitals:   01/18/24 0732 01/18/24 0800 01/18/24 0815 01/18/24 0830  BP:  93/62 (!) 75/60 (!) 85/62  Pulse:  (!) 117 (!) 106 (!) 107  Resp:  (!) 21 (!) 23 (!) 23  Temp: 98.3 F (36.8 C)     TempSrc: Axillary     SpO2:  99% 97% 99%  Weight:      Height:       Physical Exam: General:NAD, lying flat, not responding much verbally Heart:RRR, s1s2 nl Lungs: Bilateral distant breath sound Abdomen:soft, mildly distended Extremities: 1+ bilat LE edema, bilat upper thighs w/ skin hardening/ blackening in serpentine pattern w/ some skin sloughing Dialysis Access: TDC  OP HD: MWF [facility] 4.5h   B400  180kg  TDC  NO heparin  (h/o HIT) Last OP HD 5/09, post wt 190kg  Assessment/ Plan # Shock, cardiogenic:  12/06/23 EF < 20%. Sepsis less likely with neg cultures, abx off. Not a candidate for advanced therapies  # ESRD:  on HD since 01/2021. [facility] MWF, noncompliant with outpatient HD with chronic hyperkalemia.  Was on CRRT 6/10- 6/19, then was dc'd. He remained too unstable for iHD. Long discussion 6/23 w/ pt's mother about EOL issues. We agreed for a 3 day trial of CRRT (6/23 -> 6/26). She understands after the CRRT trial we will do iHD if he is stable enough, and if not stable for iHD she understands that he will not make it and will need comfort care.    # Anemia of ESRD: cont weekly darge sq 150 mcg . Transfuse as needed.  # Chronic hypotension/volume: on Levophed  15- 20 micrograms/min  # a fib: Has been running in the 110s-130s  #HIT: avoiding  heparin   #Elevated Tbili: with synthetic dysfunction  # Suspected calciphylaxis: bilat anterior thighs w/ sig areas of skin hardening as well as serpentine areas of skin darkening w/ associated sloughing. Exam looks like calciphylaxis. Avoid Ca/ vit D products. Will start IV sodium thiosulfate tiw. Very poor prognosis w/ significant calciphylaxis on background of cardiogenic shock.    Myer Fret  MD  [facility] 01/18/2024, 9:31 AM  Recent Labs  Lab 01/16/24 0527 01/16/24 1006 01/17/24 0736 01/17/24 1721 01/18/24 0459  HGB 10.1*  --  9.9*  --   --   ALBUMIN   --    < >  --  <1.5* <1.5*  <1.5*  CALCIUM   --    < >  --  8.2* 7.9*  PHOS  --    < >  --  4.7* 3.9  CREATININE  --    < >  --  3.57* 2.86*  K  --    < >  --  5.4* 4.6   < > = values in this interval not displayed.    Inpatient medications:  Chlorhexidine  Gluconate Cloth  6 each Topical Daily   Darbepoetin Alfa   150 mcg Subcutaneous Q7 days   digoxin   0.125 mg Intravenous QODAY   feeding supplement (NEPRO CARB STEADY)  237 mL Oral BID BM   leptospermum manuka honey  1 Application Topical Daily  liver oil-zinc  oxide   Topical BID   mouth rinse  15 mL Mouth Rinse 4 times per day   pantoprazole  (PROTONIX ) IV  40 mg Intravenous Q12H    anticoagulant sodium citrate      norepinephrine  (LEVOPHED ) Adult infusion 18 mcg/min (01/18/24 0800)   PrismaSol  BGK 2/3.5 1,500 mL/hr at 01/18/24 0754   prismasol  BGK 4/2.5 400 mL/hr at 01/17/24 2321   prismasol  BGK 4/2.5 400 mL/hr at 01/17/24 2323   sodium thiosulfate 25 g in sodium chloride  0.9 % 200 mL Infusion for Calciphylaxis Stopped (01/17/24 1852)   vancomycin  Stopped (01/17/24 1145)   anticoagulant sodium citrate , camphor-menthol , HYDROmorphone  (DILAUDID ) injection, ondansetron  (ZOFRAN ) IV, mouth rinse

## 2024-01-18 NOTE — Progress Notes (Signed)
 NAME:  Phillip Young, MRN:  [id number], DOB:  [birth year], LOS: 15 ADMISSION DATE:  01/03/2024, CONSULTATION DATE:  01/03/24 REFERRING MD:  Dr. Patsey, CHIEF COMPLAINT:  SOB/ chills   History of Present Illness:  52 yoM w/PMH as below significant for biventricular HF (EF < 20%), chronic afib on Eliquis , chronic hypotension on midodrine  30mg  TID, ESRD, chronic hypoxic and hypercarbic respiratory failure on NIV, morbid obesity, medical non-compliance, and chronic pain presenting from home after being discharged from [facility] 6/9 with SOB, chills, subjective fever, ongoing poor PO, vomiting today strings of blood but then reported it was with coughing episodes, pain all over, and increased weeping from left thigh blister.  Had BM, unclear on consistency/ color.  Reports last dialysis yesterday.  Initially reports not taking any PO meds today but then said he did this morning.  Non- ambulatory at baseline.  Frequent admits, most recently 5/13-5/22 for recurrent septic shock and MRSE bacteremia due to suspected line complicated by acute on chronic HF and respiratory failure, suspected IE but not a candidate for TEE, discharged on vancomycin  with HD scheduled thru 6/30.  TDC was replaced 5/22 after 2 day line holiday.  Also started on micafungin  w/HD at [facility] after [facility] grew candida species, scheduled thru 6/30.   In ER, temp 99.1, on HFNC at 6L, with SBP into 70's requiring NE support after 500 ml bolus.  Labs significant for Na 129, K 6.3, Cl 89, alk phos 346, AST 84, t.bili 12 (recent 3.4 on 5/14), lactic 3.8, WBC 20.6 (previously 8.1 on 5/22), INR 2.9, CXR showing vascular congestion and increasing congestion and pleural effusions.  BC sent and empirically started on linezolid , flagyl , and cefepime .  Pending CT chest/abd/ pelvis.  Nephrology consulted.  PCCM called for admit.   Pertinent  Medical History   Past Medical History:  Diagnosis Date   Acute on chronic respiratory failure with hypoxia ([name])  04/21/2021   Acute on chronic systolic (congestive) heart failure ([name]) 02/26/2020   Amiodarone  toxicity    Anemia    Biventricular congestive heart failure ([name])    Candida parapsilosis infection 12/26/2023   Chronic hypoxemic respiratory failure ([name])    Class 3 severe obesity due to excess calories with serious comorbidity and body mass index (BMI) of 50.0 to 59.9 in adult 02/26/2020   ESRD on hemodialysis [facility])    Essential hypertension 02/26/2020   GERD without esophagitis 02/26/2020   Hidradenitis suppurativa 02/26/2020   NICM (nonischemic cardiomyopathy) ([name])    Obesity hypoventilation syndrome ([name])    OSA (obstructive sleep apnea)    Pneumonia    Prediabetes 02/26/2020   Significant Hospital Events: Including procedures, antibiotic start and stop dates in addition to other pertinent events   6/10 Admit 6/12 Remains on vasopressors.  Spoke to heart failure.  Not a candidate for advanced therapies.  Not a candidate for tailored hemodynamics.  Recommend a strategy of vasopressor support at fluid removal in an attempt to transition back to IHD. 6/15 patient brought up discussion of limiting some of his care; Palliative re-consulted.  6/16 palliative consulted per patient's request. CT unrevealing for cause of abdominal pain. 6/23 nephro decided to restart CRRT despite prior nephro stopping. Remains hypotensive, mother did not consent central line, PICC delayed, peripheral pressors and resume CRRT, developed ileus vs SBO, NG placed to [facility] 6/24 PICC could not be placed, mother refused central line again  Interim History / Subjective:  Still on norepinephrine  double digits via peripheral, refused central line via mother,  remains encephalopathic   Objective    Blood pressure (!) 85/62, pulse (!) 107, temperature 98.3 F (36.8 C), temperature source Axillary, resp. rate (!) 23, height 6' (1.829 m), weight (!) 162.8 kg, SpO2 99%.        Intake/Output Summary (Last 24 hours) at  01/18/2024 0948 Last data filed at 01/18/2024 0800 Gross per 24 hour  Intake 2082.9 ml  Output -28 ml  Net 2110.9 ml   Filed Weights   01/16/24 0500 01/17/24 0500 01/18/24 0500  Weight: (!) 160.6 kg (!) 159.7 kg (!) 162.8 kg   Examination: Chronically ill appearing obese man, laying flat On nasal cannula Breath and lung sounds diminished RRR No wheeze Abdomen obese, soft Non pitting peripheral edema vs lymphedema Faint breathy and soft voice, very weak, unable to sit up independently, alert but disoriented, does not reliably follow commands   Labs and images reviewed   Resolved problem list:    Assessment and Plan  Shock ([name])> suspect primarily cardiogenic Continue vasopressor support with Levophed , MAP goal 60 or SBP 90 Continue high-dose midodrine  at 40 mg 3 times daily - on hold as NPO and NG To LIWS Continue IV vancomycin  to treat cellulitis Not a candidate for advanced heart failure therapy Patient mother refused solucortef 6/23 for possible adrenal insufficiency  Mother efused central line 6/23 and 6/24, unable to place picc, cap NE at 20 via peripheral  Hyperbilirubinemia-- POA and present 5/25 now worsened, congestive vs low forward flow state. 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 No signs of obstruction.  Acaculous cholecystitis is possible> no suggestion of this on any of his imaging. Patent hepatic portal veins so no Budd Chiari. Low suspicion for DILI with normal transaminases and no obvious culprit.  Continue trial of ursodiol  - on hold for NG to LIWS  Acute on chronic biventricular congestive heart failure ([name])-combined sysolic & diastolic; known NICM. Echo shows severe RV and LV failure with EF <20%. Non-compliant for many years. Per advanced  heart failure, patient is not a candidate for advanced therapy Unable to tolerate GDMT in the setting of hypotension CRRT was stopped, still requiring vasopressor support, would not tolerate IHD, CRRT resumed time limited trial 6/23 for 3 days to end AM 6/25 If he is unable to wean off pressors, hospice care will be the only reasonable option.  Right now I think it is highly unlikely he will be able to resume iHD with his current status. Palliative care reconsulted and following.  ESRD on hemodialysis CRRT was stopped, discussed with nephro utility to resume, time limited trial again after discussion 6/23 Unable to tolerate HD due to high norepinephrine  needs As such unless hemodynamics improve there is no option for dialysis long term  Constipation SBO on plain film 6/23 Had BM 6/21. Continue to offer bowel regimen NG placed overnight to 6/24, LIWS, repeat plain film in slightly imrpoved on my review, rads read as unchanged  Obesity hypoventilation syndrome - Intermittently compliant with home BIPAP. Recommend at bedtime or when sleeping otherwise he use bipap; patient intermittently declining   Permanent atrial fibrillation  Amiodarone  intolerant Heart rate is controlled Bival stopped with bleeding wounds 6/24, resume 6/25 ad hgb stable and no further bleeding Due to hyperbilirubinemia, can not switch him to Eliquis   Continue digoxin  every 48 hours  Chronic  pain disorder Continue pregabalin  Oxycodone  and Dilaudid   stopped considering low blood pressure  Mild hyperammonemia Lactulose  BID; he has been refusing  Skin ulceration; recent cellulitis  Has chronic skin ulceration, and he routinely has been refusing dressing changes. He understands he will not be able to make progress if we cannot keep his wounds clean. On examination: multiple areas of covered wound but no drainage or discoloration.  On Vanc- dosing per pharmacy  Long term prognosis remains grim. No good options if he  is not able to tolerate iHD. Appreciate Palliative care team's management. Most likely outcome is in hospital death. GOC remain full code yet at times refuse appropriate and aggressive medical care as recommended. Not amenable currently to discussion about imminent death.    Best Practice (right click and Reselect all SmartList Selections daily)   Diet/type: Regular consistency (see orders) DVT prophylaxis SCD, bival gtt Pressure ulcer(s): present on admission  GI prophylaxis: PPI Lines: Dialysis Catheter-pta R TDC (12/15/23) Foley:  N/A Code Status:  full code Last date of multidisciplinary goals of care discussion - discussed with mom bedside 6/24, discussed with CMO Gordy Pina, MD 6/23.   CRITICAL CARE Performed by: Donnice SAUNDERS Madline Oesterling   Total critical care time: 32 minutes  Critical care time was exclusive of separately billable procedures and treating other patients.  Critical care was necessary to treat or prevent imminent or life-threatening deterioration.  Critical care was time spent personally by me on the following activities: development of treatment plan with patient and/or surrogate as well as nursing, discussions with consultants, evaluation of patient's response to treatment, examination of patient, obtaining history from patient or surrogate, ordering and performing treatments and interventions, ordering and review of laboratory studies, ordering and review of radiographic studies, pulse oximetry and re-evaluation of patient's condition.      Donnice SAUNDERS Jemima Petko [facility] 01/18/2024 9:48 AM  Pager: see AMION  If no response to pager , please call critical care on call (see AMION) until 7pm After 7:00 pm call Elink

## 2024-01-18 NOTE — Plan of Care (Signed)
  Problem: Education: Goal: Knowledge of General Education information will improve Description: Including pain rating scale, medication(s)/side effects and non-pharmacologic comfort measures Outcome: Not Progressing   Problem: Health Behavior/Discharge Planning: Goal: Ability to manage health-related needs will improve Outcome: Not Progressing   Problem: Clinical Measurements: Goal: Ability to maintain clinical measurements within normal limits will improve Outcome: Not Progressing Goal: Will remain free from infection Outcome: Not Progressing Goal: Diagnostic test results will improve Outcome: Not Progressing   Problem: Activity: Goal: Risk for activity intolerance will decrease Outcome: Not Progressing   Problem: Nutrition: Goal: Adequate nutrition will be maintained Outcome: Not Progressing   Problem: Coping: Goal: Level of anxiety will decrease Outcome: Not Progressing   Problem: Elimination: Goal: Will not experience complications related to bowel motility Outcome: Not Progressing Goal: Will not experience complications related to urinary retention Outcome: Not Progressing   Problem: Pain Managment: Goal: General experience of comfort will improve and/or be controlled Outcome: Not Progressing   Problem: Safety: Goal: Ability to remain free from injury will improve Outcome: Not Progressing   Problem: Skin Integrity: Goal: Risk for impaired skin integrity will decrease Outcome: Not Progressing

## 2024-01-18 NOTE — Procedures (Signed)
 Central Venous Catheter Insertion Procedure Note  Phillip Young  [id number]  Apr 13, 1986  Date:01/18/24  Time:1:52 PM   Provider Performing:Christino Mcglinchey CHRISTELLA Ilah   Procedure: Insertion of Non-tunneled Central Venous Catheter(36556) with US  guidance (23062)   Indication(s) Medication administration and Difficult access  Consent Risks of the procedure as well as the alternatives and risks of each were explained to the patient and/or caregiver.  Consent for the procedure was obtained and is signed in the bedside chart  Anesthesia Topical only with 1% lidocaine    Timeout Verified patient identification, verified procedure, site/side was marked, verified correct patient position, special equipment/implants available, medications/allergies/relevant history reviewed, required imaging and test results available.  Sterile Technique Maximal sterile technique including full sterile barrier drape, hand hygiene, sterile gown, sterile gloves, mask, hair covering, sterile ultrasound probe cover (if used).  Procedure Description Area of catheter insertion was cleaned with chlorhexidine  and draped in sterile fashion.  With real-time ultrasound guidance a central venous catheter was placed into the left internal jugular vein. Nonpulsatile blood flow and easy flushing noted in all ports.  The catheter was sutured in place and sterile dressing applied.    Complications/Tolerance None; patient tolerated the procedure well. Chest X-ray is ordered to verify placement for internal jugular or subclavian cannulation.   Chest x-ray is not ordered for femoral cannulation.  EBL Minimal  Specimen(s) None  Corean CHRISTELLA Ilah, [place] [facility] 01/18/24 1:53 PM  Please see [email or web address] for pager details.  From 7A-7P if no response, please call [phone] After hours, please call ELink [phone]

## 2024-01-18 NOTE — Progress Notes (Signed)
 Daily Progress Note   Patient Name: Phillip Young       Date: 01/18/2024 DOB: [birth year]  Age: 38 y.o. MRN#: [id number] Attending Physician: Annella Donnice SAUNDERS, MD Primary Care Physician: [name], DO Admit Date: 01/03/2024  Reason for Consultation/Follow-up: Establishing goals of care  Subjective: Medical records reviewed including progress notes, labs, imaging. Patient assessed at the bedside. He is lethargic, occasionally moves extremities. Hypotensive on 20 Levo. Discussed with RN. Patient's wife is present visiting for scheduled family meeting.  Created space and opportunity for patient and family's thoughts and feelings on patient's current illness and the past week since our last conversation. Phillip Young speaks to the ups and downs patient has had and her belief that he will recover, just as he has the other two times that she has been told he will die.  She states he was feeling better while doing iHD until this was stopped. She would like clarification on patient's abdomen and does not understand why he is not being given midodrine . Education was provided on SBO and management, as well as inability to take PO meds and rationale for pressors in patient's acute illness.  She questions whether he actually has an SBO or if this is just assumed.  Provided update on labs including bilirubin.  Of note, patient's WBC increased today from 21.9 to 28.3.  Lactic acid is elevated at 3.7.  Discussed central line which she is now agreeable if it is his only chance to purse ongoing aggressive care.  She tells me that she knows him well and that he would want to continue fighting despite him telling me last week that he only wanted to do a 2-week trial of aggressive care.  She also feels that he is getting different care plan and someone who is  expected to recover would be receiving.  Emotional support and therapeutic listening was provided.  I shared rationale for consideration of DNR after discontinuation of CRRT given high risk for cardiac arrhythmias/arrest and no other options even if resuscitation were to be successful.  She can understand this but does not want to talk about it today.  She desires full code.  I was present for Dr. Leatha update and education as well.  We made the plan to meet with Phillip Young again tomorrow at 930 to continue goals of care discussions.  Questions and concerns addressed. PMT will continue to support holistically.   Length of Stay: 15   Physical Exam Vitals and nursing note reviewed.  Constitutional:      General: He is not in acute distress.    Appearance: He is ill-appearing.   Cardiovascular:     Comments: AFIB Pulmonary:     Effort: Tachypnea present.   Skin:    General: Skin is warm and dry.   Neurological:     Mental Status: He is lethargic.   Psychiatric:        Cognition and Memory: Cognition is impaired.             Vital Signs: BP 109/70   Pulse (!) 104   Temp 98.3 F (36.8 C) (Axillary)   Resp (!) 22   Ht 6' (1.829 m)   Phillip Young)  162.8 kg   SpO2 97%   BMI 48.68 kg/m  SpO2: SpO2: 97 % O2 Device: O2 Device: Nasal Cannula O2 Flow Rate: O2 Flow Rate (L/min): 5 L/min      Palliative Assessment/Data: 10%   Palliative Care Assessment & Plan   Patient Profile: 38 y.o. male  with past medical history of biventricular HF (EF < 20%), chronic afib on Eliquis , chronic hypotension on midodrine  30mg  TID, ESRD, chronic hypoxic and hypercarbic respiratory failure on NIV, morbid obesity, medical non-compliance  admitted on 01/03/2024 with pain, left wound weeping.    Patient has had 7 hospitalizations in the past 6 months. Most recent admit 5/13-5/22 for recurrent septic shock and MRSE bacteremia due to suspected line complicated by acute on chronic HF and respiratory  failure, suspected IE but not a candidate for TEE, discharged on vancomycin  with HD scheduled thru 6/30.     He is currently admitted for shock (cardiogenic, septic shock likely as well) with acute on chronic CHF and requiring pressors. He has multiple wounds and ulcers but refusing dressing changes. Also with constipation and opting against bowel regimen.    PMT has been consulted to assist with goals of care conversation at patient's request.  Assessment: Goals of care conversation End-stage renal disease on dialysis Cardiogenic shock Acute on chronic CHF biventricular Chronic pain Hyperbilirubinemia End-stage renal disease on CRRT SBO  Recommendations/Plan: Continue full code/full scope treatment [name] has difficulty trusting medical team recommendations and insight due to previous experiences when she was told he would die Ongoing goals of care discussions Psychosocial and emotional support provided PMT will continue to follow and support   Prognosis: Very poor  Discharge Planning: To Be Determined   Billing based on time  Time Total: 90  Visit consisted of counseling and education dealing with the complex and emotionally intense issues of symptom management and palliative care in the setting of serious and potentially life-threatening illness. Greater than 50% of this time was spent counseling and coordinating care related to the above assessment and plan.  Personally spent 90 minutes in patient care including extensive chart review (labs, imaging, progress/consult notes, vital signs), medically appropraite exam, discussed with treatment team, education to patient, family, and staff, documenting clinical information, medication review and management, coordination of care, and available advanced directive documents.           Eugenie Harewood P Trinka Keshishyan, PA-C  Palliative Medicine Team Team phone # [phone]  Thank you for allowing the Palliative Medicine Team to  assist in the care of this patient. Please utilize secure chat with additional questions, if there is no response within 30 minutes please call the above phone number.  Palliative Medicine Team providers are available by phone from 7am to 7pm daily and can be reached through the team cell phone.  Should this patient require assistance outside of these hours, please call the patient's attending physician.

## 2024-01-18 NOTE — Progress Notes (Signed)
 Elink notified about patient and patients mom wanting to take the NGT out d/t discomfort. Elink aware and are going to camera in to discuss care plan with patient and mom.

## 2024-01-19 DIAGNOSIS — Z992 Dependence on renal dialysis: Secondary | ICD-10-CM | POA: Diagnosis not present

## 2024-01-19 DIAGNOSIS — N186 End stage renal disease: Secondary | ICD-10-CM | POA: Diagnosis not present

## 2024-01-19 DIAGNOSIS — R579 Shock, unspecified: Secondary | ICD-10-CM | POA: Diagnosis not present

## 2024-01-19 LAB — CBC
HCT: 25.5 % — ABNORMAL LOW (ref 39.0–52.0)
Hemoglobin: 8.9 g/dL — ABNORMAL LOW (ref 13.0–17.0)
MCH: 29.6 pg (ref 26.0–34.0)
MCHC: 34.9 g/dL (ref 30.0–36.0)
MCV: 84.7 fL (ref 80.0–100.0)
Platelets: 119 10*3/uL — ABNORMAL LOW (ref 150–400)
RBC: 3.01 MIL/uL — ABNORMAL LOW (ref 4.22–5.81)
RDW: 22.1 % — ABNORMAL HIGH (ref 11.5–15.5)
WBC: 33.3 10*3/uL — ABNORMAL HIGH (ref 4.0–10.5)
nRBC: 0.2 % (ref 0.0–0.2)

## 2024-01-19 LAB — RENAL FUNCTION PANEL
Albumin: <1.5 g/dL — ABNORMAL LOW (ref 3.5–5.0)
Albumin: <1.5 g/dL — ABNORMAL LOW (ref 3.5–5.0)
Anion gap: 16 — ABNORMAL HIGH (ref 5–15)
Anion gap: 12 (ref 5–15)
BUN: 28 mg/dL — ABNORMAL HIGH (ref 6–20)
BUN: 22 mg/dL — ABNORMAL HIGH (ref 6–20)
CO2: 19 mmol/L — ABNORMAL LOW (ref 22–32)
CO2: 21 mmol/L — ABNORMAL LOW (ref 22–32)
Calcium: 8.2 mg/dL — ABNORMAL LOW (ref 8.9–10.3)
Calcium: 7.9 mg/dL — ABNORMAL LOW (ref 8.9–10.3)
Chloride: 99 mmol/L (ref 98–111)
Chloride: 95 mmol/L — ABNORMAL LOW (ref 98–111)
Creatinine, Ser: 2.11 mg/dL — ABNORMAL HIGH (ref 0.61–1.24)
Creatinine, Ser: 1.84 mg/dL — ABNORMAL HIGH (ref 0.61–1.24)
GFR, Estimated: 40 mL/min — ABNORMAL LOW (ref >60)
GFR, Estimated: 48 mL/min — ABNORMAL LOW (ref >60)
Glucose, Bld: 88 mg/dL (ref 70–99)
Glucose, Bld: 91 mg/dL (ref 70–99)
Phosphorus: 3.3 mg/dL (ref 2.5–4.6)
Phosphorus: 3.4 mg/dL (ref 2.5–4.6)
Potassium: 4 mmol/L (ref 3.5–5.1)
Potassium: 3.7 mmol/L (ref 3.5–5.1)
Sodium: 134 mmol/L — ABNORMAL LOW (ref 135–145)
Sodium: 128 mmol/L — ABNORMAL LOW (ref 135–145)

## 2024-01-19 LAB — HEPATIC FUNCTION PANEL
ALT: 44 U/L (ref 0–44)
AST: 69 U/L — ABNORMAL HIGH (ref 15–41)
Albumin: <1.5 g/dL — ABNORMAL LOW (ref 3.5–5.0)
Alkaline Phosphatase: 216 U/L — ABNORMAL HIGH (ref 38–126)
Bilirubin, Direct: 14.9 mg/dL — ABNORMAL HIGH (ref 0.0–0.2)
Indirect Bilirubin: 6.1 mg/dL — ABNORMAL HIGH (ref 0.3–0.9)
Total Bilirubin: 21 mg/dL (ref 0.0–1.2)
Total Protein: 5.7 g/dL — ABNORMAL LOW (ref 6.5–8.1)

## 2024-01-19 LAB — MAGNESIUM: Magnesium: 2.2 mg/dL (ref 1.7–2.4)

## 2024-01-19 LAB — APTT: aPTT: 70 s — ABNORMAL HIGH (ref 24–36)

## 2024-01-19 LAB — LACTIC ACID, PLASMA: Lactic Acid, Venous: 2.7 mmol/L (ref 0.5–1.9)

## 2024-01-19 MED ORDER — HEPARIN SODIUM (PORCINE) 1000 UNIT/ML IJ SOLN
INTRAMUSCULAR | Status: AC
  Filled 2024-01-19: qty 4

## 2024-01-19 MED ORDER — URSODIOL 300 MG PO CAPS
300.0000 mg | ORAL_CAPSULE | Freq: Two times a day (BID) | ORAL | Status: DC
  Administered 2024-01-19 – 2024-01-24 (×11): 300 mg via ORAL
  Filled 2024-01-19 (×15): qty 1

## 2024-01-19 MED ORDER — MIDODRINE HCL 5 MG PO TABS
40.0000 mg | ORAL_TABLET | Freq: Three times a day (TID) | ORAL | Status: DC
  Administered 2024-01-19 – 2024-01-20 (×3): 40 mg via ORAL
  Administered 2024-01-20: 15 mg via ORAL
  Administered 2024-01-21 – 2024-01-25 (×13): 40 mg via ORAL
  Filled 2024-01-19 (×18): qty 8

## 2024-01-19 MED ORDER — VANCOMYCIN VARIABLE DOSE PER UNSTABLE RENAL FUNCTION (PHARMACIST DOSING): Status: DC

## 2024-01-19 MED ORDER — HYDROMORPHONE HCL 1 MG/ML IJ SOLN
0.5000 mg | Freq: Once | INTRAMUSCULAR | Status: AC
  Administered 2024-01-19: 0.5 mg via INTRAVENOUS
  Filled 2024-01-19: qty 1

## 2024-01-19 NOTE — Progress Notes (Signed)
 Physical Therapy Treatment Patient Details Name: Phillip Young MRN: [id number] DOB: 1985/12/03 Today's Date: 01/19/2024   History of Present Illness 38 yo male adm  01/03/24 for SOB, chills, and incr weeping from lt thigh blister. Pt was dc'd from [facility] on same date. Pt with shock  from decompensated HF. CRRT 6/10-6/19, restarted 6/23. PMH - ESRD on HD, chronic resp failure, morbid obesity, NICM, OSA, chronic pain, chronic hypotension.    PT Comments  Pt awake with limited intelligible speech. Mom present in room and wanting therapy to attempt despite pt initial declination of therapy. Pt able to tolerate 6 min in tilt up to 35degrees before fatigue and fixated on wanting water . Pt maintains bil hip external rotation and limited left knee extension. Tilt to 25 degrees for 4 min, 30 degrees 1 min and 35 degrees 1 min. Pt positioned in semichair end of session with RN present. Pt agitated with mitt return end of session and lack of fluid.  Supine 120/79 (92) 25degrees 125/94 (104) 35 degrees 141/114 (125) HR 127, SPO2 99% on 2L Attempted RA with rapid drop to 85% and maintained 2L throughout    If plan is discharge home, recommend the following: Two people to help with walking and/or transfers;Two people to help with bathing/dressing/bathroom   Can travel by private vehicle        Equipment Recommendations  None recommended by PT    Recommendations for Other Services       Precautions / Restrictions Precautions Precautions: Fall;Other (comment) Recall of Precautions/Restrictions: Impaired Precaution/Restrictions Comments: many wounds, CRRT, NGT     Mobility  Bed Mobility Overal bed mobility: Needs Assistance             General bed mobility comments: scooting to HOB in trendelenburg max +2    Transfers Overall transfer level: Needs assistance                 General transfer comment: tilt to 35 degrees with physical blocking at knees. Pt maintains left hip  external rotation throughout even with pillows engaged at lateral thighs to assist rotation but able to engage quads repeatedly to push against foot board for 6 min total tilt. max quad contraction 10 sec at a time    Ambulation/Gait               General Gait Details: Doesnt walk at home   Stairs             Wheelchair Mobility     Tilt Bed    Modified Rankin (Stroke Patients Only)       Balance                                            Communication Communication Communication: Impaired Factors Affecting Communication: Difficulty expressing self;Reduced clarity of speech  Cognition Arousal: Lethargic Behavior During Therapy: Flat affect   PT - Cognitive impairments: Difficult to assess                       PT - Cognition Comments: pt stating name and mom in room. Pt initially stating no to therapy but mom insistent on attempt. Pt participating and pleasant during tilt. Pt frustrated, agitated and beligerant end of session with application of mitts and lack of fluid Following commands: Impaired Following commands impaired: Follows one step commands inconsistently, Follows one  step commands with increased time    Cueing Cueing Techniques: Verbal cues, Gestural cues, Tactile cues  Exercises      General Comments        Pertinent Vitals/Pain Pain Assessment Pain Assessment: Faces Pain Score: 6  Pain Location: LLE with movement Pain Descriptors / Indicators: Grimacing, Guarding Pain Intervention(s): Limited activity within patient's tolerance, Monitored during session, Repositioned    Home Living                          Prior Function            PT Goals (current goals can now be found in the care plan section) Acute Rehab PT Goals Patient Stated Goal: decr pain, get up PT Goal Formulation: With patient/family Time For Goal Achievement: 02/02/24 Potential to Achieve Goals: Poor Progress towards PT  goals: Progressing toward goals    Frequency    Min 1X/week      PT Plan      Co-evaluation              AM-PAC PT 6 Clicks Mobility   Outcome Measure  Help needed turning from your back to your side while in a flat bed without using bedrails?: Total Help needed moving from lying on your back to sitting on the side of a flat bed without using bedrails?: Total Help needed moving to and from a bed to a chair (including a wheelchair)?: Total Help needed standing up from a chair using your arms (e.g., wheelchair or bedside chair)?: Total Help needed to walk in hospital room?: Total Help needed climbing 3-5 steps with a railing? : Total 6 Click Score: 6    End of Session Equipment Utilized During Treatment: Oxygen  Activity Tolerance: Patient tolerated treatment well Patient left: in bed;with call bell/phone within reach;with nursing/sitter in room;with family/visitor present Nurse Communication: Mobility status;Need for lift equipment;Precautions PT Visit Diagnosis: Other abnormalities of gait and mobility (R26.89);Muscle weakness (generalized) (M62.81);Difficulty in walking, not elsewhere classified (R26.2);Pain     Time: 9168-9095 PT Time Calculation (min) (ACUTE ONLY): 33 min  Charges:    $Therapeutic Activity: 23-37 mins PT General Charges $$ ACUTE PT VISIT: 1 Visit                     Lenoard SQUIBB, PT Acute Rehabilitation Services Office: [phone]    Antonella Upson B Cashtyn Pouliot 01/19/2024, 10:12 AM

## 2024-01-19 NOTE — Progress Notes (Addendum)
 Daily Progress Note   Patient Name: Phillip Young       Date: 01/19/2024 DOB: [birth year]  Age: 38 y.o. MRN#: [id number] Attending Physician: Annella Donnice SAUNDERS, MD Primary Care Physician: [name], DO Admit Date: 01/03/2024  Reason for Consultation/Follow-up: Establishing goals of care  Subjective: Medical records reviewed including progress notes, labs, imaging. Patient assessed at the bedside. He is more alert, reporting back pain. Redirected from pulling NGT. [name] is present visiting overnight and agreeable to continuing GOC discussion.  Emotional support and therapeutic listening was provided as mother reflected on her concerns, including that pressors were increased and that NGT remains in place. She wants a CT of his abdomen to evaluate SBO further and has been researching this herself. She understands plan is to discontinue CRRT this afternoon and feels absolutely certain that he will do well and go on to tolerate iHD. I attempted to discuss the possibility that he would not tolerate iHD and that comfort care would be the only alternative option. She did not believe this and shared her skepticism about physicians' statements and plans from different specialities. She continues to feel that the medical team is trying to find ways to kill him when he does not meet expectations of in-hospital death. Attempted to provide education on care plan.  Followed up on code status conversation from yesterday, however patient's mother does not want to discuss that at all and still feels upset about how this conversation was handled during previous hospitalization. She shares her feeling that palliative care opens the door to mercy killing. Explored patient's feelings on his care and he would like to continue full code/full scope  treatment.  Questions and concerns addressed. PMT will continue to support holistically.   Length of Stay: 16   Physical Exam Vitals and nursing note reviewed.  Constitutional:      General: He is not in acute distress.    Appearance: He is ill-appearing.   Cardiovascular:     Comments: AFIB Pulmonary:     Effort: Tachypnea present.   Skin:    General: Skin is warm and dry.   Neurological:     Mental Status: He is lethargic.   Psychiatric:     Comments: restless             Vital Signs: BP (!) 97/59   Pulse 90   Temp 98.9 F (37.2 C) (Axillary)   Resp (!) 25   Ht 6' (1.829 m)   Wt (!) 162.6 kg   SpO2 97%   BMI 48.62 kg/m  SpO2: SpO2: 97 % O2 Device: O2 Device: Nasal Cannula O2 Flow Rate: O2 Flow Rate (L/min): 3 L/min      Palliative Assessment/Data: 10%   Palliative Care Assessment & Plan   Patient Profile: 38 y.o. male  with past medical history of biventricular HF (EF < 20%), chronic afib on Eliquis , chronic hypotension on midodrine  30mg  TID, ESRD, chronic hypoxic and hypercarbic respiratory failure on NIV, morbid obesity, medical non-compliance  admitted on 01/03/2024 with pain, left wound weeping.    Patient has had 7 hospitalizations in the past 6 months. Most recent admit 5/13-5/22 for recurrent septic shock and MRSE bacteremia due to  suspected line complicated by acute on chronic HF and respiratory failure, suspected IE but not a candidate for TEE, discharged on vancomycin  with HD scheduled thru 6/30.     He is currently admitted for shock (cardiogenic, septic shock likely as well) with acute on chronic CHF and requiring pressors. He has multiple wounds and ulcers but refusing dressing changes. Also with constipation and opting against bowel regimen.    PMT has been consulted to assist with goals of care conversation at patient's request.  Assessment: Goals of care conversation End-stage renal disease on dialysis Cardiogenic shock Acute on chronic  CHF biventricular Chronic pain Hyperbilirubinemia End-stage renal disease on CRRT SBO  Recommendations/Plan: Continue full code/full scope treatment Goals are clear for all available interventions to prolong life [name] has stated repeatedly that she will never trust the medical team and her priority is keeping him alive by any means necessary Patient himself has consistently deferred to mother's preferences each time I have met with them, despite occasionally expressing interest in speaking to PMT about comfort care. He understands the option is available Psychosocial and emotional support provided PMT will continue to follow peripherally   Prognosis: Very poor  Discharge Planning: To Be Determined   Billing based on time  Time Total: 80  Visit consisted of counseling and education dealing with the complex and emotionally intense issues of symptom management and palliative care in the setting of serious and potentially life-threatening illness. Greater than 50% of this time was spent counseling and coordinating care related to the above assessment and plan.  Personally spent 80 minutes in patient care including extensive chart review (labs, imaging, progress/consult notes, vital signs), medically appropraite exam, discussed with treatment team, education to patient, family, and staff, documenting clinical information, medication review and management, coordination of care, and available advanced directive documents.           Chastidy Ranker P Ailsa Mireles, PA-C  Palliative Medicine Team Team phone # [phone]  Thank you for allowing the Palliative Medicine Team to assist in the care of this patient. Please utilize secure chat with additional questions, if there is no response within 30 minutes please call the above phone number.  Palliative Medicine Team providers are available by phone from 7am to 7pm daily and can be reached through the team cell phone.  Should this  patient require assistance outside of these hours, please call the patient's attending physician.

## 2024-01-19 NOTE — Progress Notes (Signed)
 [facility] NEPHROLOGY PROGRESS NOTE  Summary: Pt is a 38 y.o. yo male with past medical history of biventricular heart failure EF less than 20, chronic A-fib, ESRD on HD, chronic hypotension on high-dose midodrine , nonadherence with outpatient HD and treatment, recently at [facility] presented with shock and volume overload.  Subjective:   Seen in ICU bed Pt half-awake, interacting minimally Levo peaked at 32 last evening, down to 14 this am  Objective Vital signs in last 24 hours: Vitals:   01/19/24 1215 01/19/24 1230 01/19/24 1245 01/19/24 1300  BP: (!) 90/54 (!) 87/60 (!) 89/51 (!) 76/65  Pulse: 93 92 92 (!) 103  Resp: 17 (!) 21 (!) 25 (!) 21  Temp:      TempSrc:      SpO2: 92% 97% 100% 99%  Weight:      Height:       Physical Exam: General:NAD, lying flat, NG tube in  Heart:RRR, s1s2 nl Lungs: Bilateral distant breath sound Abdomen:soft, mildly distended Extremities: 1+ bilat LE edema, bilat upper thighs w/ skin hardening/ blackening in serpentine pattern w/ some skin sloughing Dialysis Access: TDC  OP HD: MWF [facility] 4.5h   B400  180kg  TDC  NO heparin  (h/o HIT) Last OP HD 5/09, post wt 190kg  Assessment/ Plan # Shock, cardiogenic:  12/06/23 EF < 20%. Sepsis less likely with neg cultures, abx off. Not a candidate for advanced therapies  # ESRD:  on HD since 01/2021. [facility] MWF, noncompliant with outpatient HD with chronic hyperkalemia.  Was on CRRT 6/10- 6/19, then was dc'd. He remained too unstable for iHD. Long discussion 6/23 w/ pt's mother about EOL issues. We agreed for a 3 day trial of CRRT (6/23 -> 6/26) which ends today. She understands after the CRRT trial we will do iHD only if he is stable enough, and if not stable for iHD he will likely not make it.   # Anemia of ESRD: cont weekly darbe sq 150 mcg . Transfuse as needed.  # Chronic hypotension/volume: levo down to 14 this am  # a fib: HR has been running in the 110s-130s  #HIT: avoiding  heparin   #Elevated Tbili: with synthetic dysfunction  # Suspected calciphylaxis: bilat anterior thighs w/ sig areas of skin hardening as well as serpentine areas of skin darkening w/ associated sloughing. Exam looks like calciphylaxis. Avoid Ca/ vit D products. Will start IV sodium thiosulfate tiw. Very poor prognosis w/ significant calciphylaxis on background of cardiogenic shock.    Myer Fret  MD  [facility] 01/19/2024, 1:17 PM  Recent Labs  Lab 01/18/24 1447 01/18/24 2009 01/19/24 0255  HGB 8.9*  --  8.9*  ALBUMIN   --  <1.5* <1.5*  <1.5*  CALCIUM   --  8.3* 8.2*  PHOS  --  3.6 3.3  CREATININE  --  2.26* 2.11*  K  --  4.1 4.0    Inpatient medications:  Chlorhexidine  Gluconate Cloth  6 each Topical Daily   Darbepoetin Alfa   150 mcg Subcutaneous Q7 days   digoxin   0.125 mg Intravenous QODAY   feeding supplement (NEPRO CARB STEADY)  237 mL Oral BID BM   leptospermum manuka honey  1 Application Topical Daily   liver oil-zinc  oxide   Topical BID   mouth rinse  15 mL Mouth Rinse 4 times per day   pantoprazole  (PROTONIX ) IV  40 mg Intravenous Q12H   vancomycin  variable dose per unstable renal function (pharmacist dosing)   Does not apply See admin instructions  acetaminophen  Stopped (01/19/24 0004)   anticoagulant sodium citrate      norepinephrine  (LEVOPHED ) Adult infusion 14 mcg/min (01/19/24 1300)   PrismaSol  BGK 2/3.5 1,500 mL/hr at 01/19/24 1254   prismasol  BGK 4/2.5 400 mL/hr at 01/19/24 1211   prismasol  BGK 4/2.5 400 mL/hr at 01/19/24 1245   sodium thiosulfate 25 g in sodium chloride  0.9 % 200 mL Infusion for Calciphylaxis Stopped (01/17/24 1852)   anticoagulant sodium citrate , camphor-menthol , HYDROmorphone  (DILAUDID ) injection, ondansetron  (ZOFRAN ) IV, mouth rinse

## 2024-01-19 NOTE — Progress Notes (Signed)
 Pharmacy Antibiotic Note  Phillip Young is a 38 y.o. male admitted on 01/03/2024 with MRSE presumed endocarditis.  Pharmacy has been consulted for vancomycin  dosing. Patient ESRD, was off CRRT over the weekend and obtained several vancomycin  random to ensure patient remained therapeutic. CRRT restarted 6/23 PM. Has not had any pauses or issues since resuming.   Plan: Patient received 3 doses of vancomycin  1000mg  q24h. Will stop scheduled regimen as patient comes off CRRT.  CRRT will be stopped this afternoon, anticipating sometime around dinner per nephro.  Will get random level 6/27 AM given unclear time that CRRT will remain running to ensure that level remains therapeutic.  F/u plans for additional RRT.   Height: 6' (182.9 cm) Weight: (!) 162.6 kg (358 lb 7.5 oz) IBW/kg (Calculated) : 77.6  Temp (24hrs), Avg:98.4 F (36.9 C), Min:98.1 F (36.7 C), Max:98.9 F (37.2 C)  Recent Labs  Lab 01/15/24 0826 01/16/24 0527 01/16/24 1006 01/16/24 2314 01/17/24 0347 01/17/24 0348 01/17/24 0736 01/17/24 1721 01/18/24 0459 01/18/24 1447 01/18/24 2009 01/18/24 2300 01/19/24 0255 01/19/24 0555  WBC 20.9* 21.9*  --   --   --   --  28.3*  --   --  30.9*  --   --  33.3*  --   CREATININE  --   --    < >  --   --  5.13*  --  3.57* 2.86*  --  2.26*  --  2.11*  --   LATICACIDVEN  --   --   --  3.9* 3.7*  --   --   --   --   --   --  2.5*  --  2.7*  VANCORANDOM  --  22  --   --  17  --   --   --   --   --   --   --   --   --    < > = values in this interval not displayed.    Estimated Creatinine Clearance: 74.9 mL/min (A) (by C-G formula based on SCr of 2.11 mg/dL (H)).    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, SRA positive   Metoprolol  Other (See  Comments)    near syncope   Other Swelling and Other (See Comments)    Steroids Fluid seeping out of legs Nebulizer(unknown name of neb) elevated pressure    Tramadol      unknown    Dose Adjustments this admission:  6/24 VR 17 - resume vancomycin  1000mg  q24h, CRRT restarted 6/23 PM  6/23 VR 22 - vancomycin  continued to be held due to holding RRT  6/20 VR 31 - vancomycin  continued to be held due to holding RRT  6/18 VT 22 - on vancomycin  1250mg  q24h, reduced to 1000mg  24h  6/15 VT 26 - on vancomycin  1750mg  q24h, reduced to 1250mg  q24h   Antimicrobials this admission: Vancomycin  5/14 >> EOT 6/30  Microbiology results: 6/10 BCx: NGTD x 5d  6/10 MRSA PCR: negative   Thank you for allowing pharmacy to be a part of this patient's care.  Powell Blush, PharmD, BCCCP  01/19/2024 10:54 AM

## 2024-01-19 NOTE — Progress Notes (Signed)
 NAME:  Phillip Young, MRN:  [id number], DOB:  April 16, 1986, LOS: 16 ADMISSION DATE:  01/03/2024, CONSULTATION DATE:  01/03/24 REFERRING MD:  Dr. Patsey, CHIEF COMPLAINT:  SOB/ chills   History of Present Illness:  76 yoM w/PMH as below significant for biventricular HF (EF < 20%), chronic afib on Eliquis , chronic hypotension on midodrine  30mg  TID, ESRD, chronic hypoxic and hypercarbic respiratory failure on NIV, morbid obesity, medical non-compliance, and chronic pain presenting from home after being discharged from [facility] 6/9 with SOB, chills, subjective fever, ongoing poor PO, vomiting today strings of blood but then reported it was with coughing episodes, pain all over, and increased weeping from left thigh blister.  Had BM, unclear on consistency/ color.  Reports last dialysis yesterday.  Initially reports not taking any PO meds today but then said he did this morning.  Non- ambulatory at baseline.  Frequent admits, most recently 5/13-5/22 for recurrent septic shock and MRSE bacteremia due to suspected line complicated by acute on chronic HF and respiratory failure, suspected IE but not a candidate for TEE, discharged on vancomycin  with HD scheduled thru 6/30.  TDC was replaced 5/22 after 2 day line holiday.  Also started on micafungin  w/HD at [facility] after [facility] grew candida species, scheduled thru 6/30.   In ER, temp 99.1, on HFNC at 6L, with SBP into 70's requiring NE support after 500 ml bolus.  Labs significant for Na 129, K 6.3, Cl 89, alk phos 346, AST 84, t.bili 12 (recent 3.4 on 5/14), lactic 3.8, WBC 20.6 (previously 8.1 on 5/22), INR 2.9, CXR showing vascular congestion and increasing congestion and pleural effusions.  BC sent and empirically started on linezolid , flagyl , and cefepime .  Pending CT chest/abd/ pelvis.  Nephrology consulted.  PCCM called for admit.   Pertinent  Medical History   Past Medical History:  Diagnosis Date   Acute on chronic respiratory failure with hypoxia ([name])  04/21/2021   Acute on chronic systolic (congestive) heart failure ([name]) 02/26/2020   Amiodarone  toxicity    Anemia    Biventricular congestive heart failure ([name])    Candida parapsilosis infection 12/26/2023   Chronic hypoxemic respiratory failure ([name])    Class 3 severe obesity due to excess calories with serious comorbidity and body mass index (BMI) of 50.0 to 59.9 in adult 02/26/2020   ESRD on hemodialysis [facility])    Essential hypertension 02/26/2020   GERD without esophagitis 02/26/2020   Hidradenitis suppurativa 02/26/2020   NICM (nonischemic cardiomyopathy) ([name])    Obesity hypoventilation syndrome ([name])    OSA (obstructive sleep apnea)    Pneumonia    Prediabetes 02/26/2020   Significant Hospital Events: Including procedures, antibiotic start and stop dates in addition to other pertinent events   6/10 Admit 6/12 Remains on vasopressors.  Spoke to heart failure.  Not a candidate for advanced therapies.  Not a candidate for tailored hemodynamics.  Recommend a strategy of vasopressor support at fluid removal in an attempt to transition back to IHD. 6/15 patient brought up discussion of limiting some of his care; Palliative re-consulted.  6/16 palliative consulted per patient's request. CT unrevealing for cause of abdominal pain. 6/23 nephro decided to restart CRRT despite prior nephro stopping. Remains hypotensive, mother did not consent central line, PICC delayed, peripheral pressors and resume CRRT, developed ileus vs SBO, NG placed to [facility] 6/24 PICC could not be placed, mother refused central line again 6/25 central line placed  Interim History / Subjective:  Still on norepinephrine  double digits via peripheral, had  BM, try clears  Objective    Blood pressure (!) 76/65, pulse (!) 103, temperature 98.9 F (37.2 C), temperature source Axillary, resp. rate (!) 21, height 6' (1.829 m), weight (!) 162.6 kg, SpO2 99%.        Intake/Output Summary (Last 24 hours) at 01/19/2024  1359 Last data filed at 01/19/2024 1300 Gross per 24 hour  Intake 832.27 ml  Output 855 ml  Net -22.73 ml   Filed Weights   01/17/24 0500 01/18/24 0500 01/19/24 0500  Weight: (!) 159.7 kg (!) 162.8 kg (!) 162.6 kg   Examination: Chronically ill appearing obese man, laying flat On nasal cannula Breath and lung sounds diminished RRR No wheeze Abdomen obese, soft Non pitting peripheral edema vs lymphedema Faint breathy and soft voice, very weak, unable to sit up independently, alert but disoriented, does not reliably follow commands   Labs and images reviewed   Resolved problem list:    Assessment and Plan  Shock ([name])> suspect primarily cardiogenic Continue vasopressor support with Levophed , MAP goal 60 or SBP 90 Continue high-dose midodrine  at 40 mg 3 times daily - on hold as NPO and NG To LIWS Continue IV vancomycin  to treat cellulitis Not a candidate for advanced heart failure therapy Patient mother refused solucortef 6/23 for possible adrenal insufficiency  Central line placed 6/25, cap pressors at NE max dose 40 Resume midodrine  in PM given tolerating clears  Hyperbilirubinemia-- POA and present 5/25 now worsened, congestive vs low forward flow state. 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 No signs of obstruction.  Acaculous cholecystitis is possible> no suggestion of this on any of his imaging. Patent hepatic portal veins so no Budd Chiari. Low suspicion for DILI with normal transaminases and no obvious culprit.  Continue trial of ursodiol  resume as tolerating clears  Acute on chronic biventricular congestive heart failure ([name])-combined sysolic & diastolic; known NICM. Echo shows severe RV and LV failure with EF <20%. Non-compliant for many years. Per  advanced heart failure, patient is not a candidate for advanced therapy Unable to tolerate GDMT in the setting of hypotension CRRT was stopped, still requiring vasopressor support, would not tolerate IHD, CRRT resumed time limited trial 6/23 for 3 days to end AM 6/25 If he is unable to wean off pressors, hospice care will be the only reasonable option.  Right now I think it is highly unlikely he will be able to resume iHD with his current status. Palliative care reconsulted and following.  ESRD on hemodialysis CRRT was stopped, discussed with nephro utility to resume, time limited trial again after discussion 6/23 Unable to tolerate HD due to high norepinephrine  needs As such unless hemodynamics improve there is no option for dialysis long term  Constipation SBO vs Ileus on plain film 6/23 Had BM 6/21. Continue to offer bowel regimen NG placed overnight to 6/24, LIWS, repeat plain film in slightly imrpoved on my review, rads read as unchanged Had BM overnight, trial clears  Obesity hypoventilation syndrome - Intermittently compliant with home BIPAP. Recommend at bedtime or when sleeping otherwise he use bipap; patient intermittently declining   Permanent atrial fibrillation  Amiodarone  intolerant Heart rate is controlled Bival stopped with bleeding wounds 6/24, resume 6/25 ad hgb stable and no further bleeding Due to hyperbilirubinemia, can not switch him to Eliquis   Continue digoxin   every 48 hours  Chronic pain disorder Continue pregabalin  Oxycodone  and Dilaudid   stopped considering low blood pressure  Mild hyperammonemia Lactulose  BID; he has been refusing  Skin ulceration; recent cellulitis  Has chronic skin ulceration, and he routinely has been refusing dressing changes. He understands he will not be able to make progress if we cannot keep his wounds clean. On examination: multiple areas of covered wound but no drainage or discoloration.  On Vanc- dosing per pharmacy  Long  term prognosis remains grim. No good options if he is not able to tolerate iHD. Appreciate Palliative care team's management. Most likely outcome is in hospital death. GOC remain full code yet at times refuse appropriate and aggressive medical care as recommended. Not amenable currently to discussion about imminent death.    Best Practice (right click and Reselect all SmartList Selections daily)   Diet/type: Regular consistency (see orders) DVT prophylaxis SCD, bival gtt Pressure ulcer(s): present on admission  GI prophylaxis: PPI Lines: Dialysis Catheter-pta R TDC (12/15/23) Foley:  N/A Code Status:  full code Last date of multidisciplinary goals of care discussion - discussed with mom bedside 6/25, discussed with CMO Gordy Pina, MD 6/23.   CRITICAL CARE Performed by: Donnice SAUNDERS Jahdai Padovano   Total critical care time: 33 minutes  Critical care time was exclusive of separately billable procedures and treating other patients.  Critical care was necessary to treat or prevent imminent or life-threatening deterioration.  Critical care was time spent personally by me on the following activities: development of treatment plan with patient and/or surrogate as well as nursing, discussions with consultants, evaluation of patient's response to treatment, examination of patient, obtaining history from patient or surrogate, ordering and performing treatments and interventions, ordering and review of laboratory studies, ordering and review of radiographic studies, pulse oximetry and re-evaluation of patient's condition.      Donnice SAUNDERS [facility] 01/19/2024 1:59 PM  Pager: see AMION  If no response to pager , please call critical care on call (see AMION) until 7pm After 7:00 pm call Elink

## 2024-01-20 ENCOUNTER — Telehealth: Payer: Self-pay | Admitting: *Deleted

## 2024-01-20 DIAGNOSIS — N186 End stage renal disease: Secondary | ICD-10-CM | POA: Diagnosis not present

## 2024-01-20 DIAGNOSIS — R579 Shock, unspecified: Secondary | ICD-10-CM | POA: Diagnosis not present

## 2024-01-20 DIAGNOSIS — Z992 Dependence on renal dialysis: Secondary | ICD-10-CM | POA: Diagnosis not present

## 2024-01-20 LAB — APTT
aPTT: 66 s — ABNORMAL HIGH (ref 24–36)
aPTT: 99 s — ABNORMAL HIGH (ref 24–36)
aPTT: 105 s — ABNORMAL HIGH (ref 24–36)

## 2024-01-20 LAB — HEPATIC FUNCTION PANEL
ALT: 40 U/L (ref 0–44)
AST: 58 U/L — ABNORMAL HIGH (ref 15–41)
Albumin: <1.5 g/dL — ABNORMAL LOW (ref 3.5–5.0)
Alkaline Phosphatase: 184 U/L — ABNORMAL HIGH (ref 38–126)
Bilirubin, Direct: 13.2 mg/dL — ABNORMAL HIGH (ref 0.0–0.2)
Indirect Bilirubin: 6.2 mg/dL — ABNORMAL HIGH (ref 0.3–0.9)
Total Bilirubin: 19.4 mg/dL (ref 0.0–1.2)
Total Protein: 5.1 g/dL — ABNORMAL LOW (ref 6.5–8.1)

## 2024-01-20 LAB — GLUCOSE, CAPILLARY: Glucose-Capillary: 77 mg/dL (ref 70–99)

## 2024-01-20 LAB — RENAL FUNCTION PANEL
Albumin: <1.5 g/dL — ABNORMAL LOW (ref 3.5–5.0)
Anion gap: 20 — ABNORMAL HIGH (ref 5–15)
BUN: 29 mg/dL — ABNORMAL HIGH (ref 6–20)
CO2: 18 mmol/L — ABNORMAL LOW (ref 22–32)
Calcium: 8.7 mg/dL — ABNORMAL LOW (ref 8.9–10.3)
Chloride: 98 mmol/L (ref 98–111)
Creatinine, Ser: 2.32 mg/dL — ABNORMAL HIGH (ref 0.61–1.24)
GFR, Estimated: 36 mL/min — ABNORMAL LOW (ref >60)
Glucose, Bld: 96 mg/dL (ref 70–99)
Phosphorus: 3 mg/dL (ref 2.5–4.6)
Potassium: 4.1 mmol/L (ref 3.5–5.1)
Sodium: 136 mmol/L (ref 135–145)

## 2024-01-20 LAB — CBC
HCT: 25.8 % — ABNORMAL LOW (ref 39.0–52.0)
Hemoglobin: 8.6 g/dL — ABNORMAL LOW (ref 13.0–17.0)
MCH: 29.5 pg (ref 26.0–34.0)
MCHC: 33.3 g/dL (ref 30.0–36.0)
MCV: 88.4 fL (ref 80.0–100.0)
Platelets: 97 10*3/uL — ABNORMAL LOW (ref 150–400)
RBC: 2.92 MIL/uL — ABNORMAL LOW (ref 4.22–5.81)
RDW: 22.6 % — ABNORMAL HIGH (ref 11.5–15.5)
WBC: 33 10*3/uL — ABNORMAL HIGH (ref 4.0–10.5)
nRBC: 0.2 % (ref 0.0–0.2)

## 2024-01-20 LAB — MAGNESIUM: Magnesium: 2.2 mg/dL (ref 1.7–2.4)

## 2024-01-20 LAB — VANCOMYCIN, RANDOM: Vancomycin Rm: 26 ug/mL

## 2024-01-20 MED ORDER — CHLORHEXIDINE GLUCONATE CLOTH 2 % EX PADS
6.0000 | MEDICATED_PAD | Freq: Every day | CUTANEOUS | Status: DC
  Administered 2024-01-22: 6 via TOPICAL

## 2024-01-20 MED ORDER — VANCOMYCIN HCL IN DEXTROSE 1-5 GM/200ML-% IV SOLN: 1000.0000 mg | INTRAVENOUS | Status: DC

## 2024-01-20 MED ORDER — SODIUM CHLORIDE 0.9 % IV SOLN
0.0030 mg/kg/h | INTRAVENOUS | Status: DC
  Administered 2024-01-20: 0.02 mg/kg/h via INTRAVENOUS
  Filled 2024-01-20 (×2): qty 250

## 2024-01-20 NOTE — Progress Notes (Signed)
 Pharmacy Antibiotic Note  Phillip Young is a 38 y.o. male admitted on 01/03/2024 with MRSE presumed endocarditis.  Pharmacy has been consulted for vancomycin  dosing. Patient ESRD, was off CRRT over the weekend and obtained several vancomycin  random to ensure patient remained therapeutic. CRRT restarted 6/23 PM. Has not had any pauses or issues since resuming.   Vancomycin  random this AM is 26 after CRRT stopped 6/27 PM. Patient is not making urine.  Next HD in ICU Saturday.  Plan: Plan for Vancomycin  1g post HD Saturday.  Follow-up HD schedule for further dosing.  EOT per ID 6/30 for presumed MRSE endocarditis.   Height: 6' (182.9 cm) Weight: (!) 162.6 kg (358 lb 7.5 oz) IBW/kg (Calculated) : 77.6  Temp (24hrs), Avg:97.5 F (36.4 C), Min:96.6 F (35.9 C), Max:98 F (36.7 C)  Recent Labs  Lab 01/16/24 0527 01/16/24 1006 01/16/24 2314 01/17/24 0347 01/17/24 0348 01/17/24 0736 01/17/24 1721 01/18/24 0459 01/18/24 1447 01/18/24 2009 01/18/24 2300 01/19/24 0255 01/19/24 0555 01/19/24 1600 01/20/24 0415 01/20/24 0416  WBC 21.9*  --   --   --   --  28.3*  --   --  30.9*  --   --  33.3*  --   --  33.0*  --   CREATININE  --    < >  --   --    < >  --    < > 2.86*  --  2.26*  --  2.11*  --  1.84*  --  2.32*  LATICACIDVEN  --   --  3.9* 3.7*  --   --   --   --   --   --  2.5*  --  2.7*  --   --   --   VANCORANDOM 22  --   --  17  --   --   --   --   --   --   --   --   --   --  26  --    < > = values in this interval not displayed.    Estimated Creatinine Clearance: 68.1 mL/min (A) (by C-G formula based on SCr of 2.32 mg/dL (H)).    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, SRA positive   Metoprolol  Other (See Comments)    near syncope    Other Swelling and Other (See Comments)    Steroids Fluid seeping out of legs Nebulizer(unknown name of neb) elevated pressure    Tramadol      unknown    Dose Adjustments this admission:  6/24 VR 17 - resume vancomycin  1000mg  q24h, CRRT restarted 6/23 PM  6/23 VR 22 - vancomycin  continued to be held due to holding RRT  6/20 VR 31 - vancomycin  continued to be held due to holding RRT  6/18 VT 22 - on vancomycin  1250mg  q24h, reduced to 1000mg  24h  6/15 VT 26 - on vancomycin  1750mg  q24h, reduced to 1250mg  q24h   Antimicrobials this admission: Vancomycin  5/14 >> EOT 6/30  Microbiology results: 6/10 BCx: NGTD x 5d  6/10 MRSA PCR: negative   Thank you for allowing pharmacy to be a part of this patient's care.  Harlene Boga, PharmD, BCPS, BCCCP Clinical Pharmacist Please refer to [facility] for [facility]  numbers 01/20/2024 9:30 AM

## 2024-01-20 NOTE — Progress Notes (Signed)
 PHARMACY - ANTICOAGULATION  Pharmacy Consult for bivalirudin    Indication: atrial fibrillation w/ hx of HIT   Patient Measurements: Height: 6' (182.9 cm) Weight: (!) 162.6 kg (358 lb 7.5 oz) IBW/kg (Calculated) : 77.6 HEPARIN  DW (KG): 125.9  Vital Signs: Temp: 97.4 F (36.3 C) (06/27 1600) Temp Source: Axillary (06/27 1600) BP: 104/49 (06/27 1615) Pulse Rate: 85 (06/27 1615)  Labs: Recent Labs    01/18/24 1447 01/18/24 2009 01/19/24 0255 01/19/24 0925 01/19/24 1600 01/20/24 0415 01/20/24 0416 01/20/24 1400 01/20/24 1800  HGB 8.9*  --  8.9*  --   --  8.6*  --   --   --   HCT 25.8*  --  25.5*  --   --  25.8*  --   --   --   PLT 107*  --  119*  --   --  97*  --   --   --   APTT  --   --   --    < >  --  66*  --  99* 105*  CREATININE  --    < > 2.11*  --  1.84*  --  2.32*  --   --    < > = values in this interval not displayed.    Estimated Creatinine Clearance: 68.1 mL/min (A) (by C-G formula based on SCr of 2.32 mg/dL (H)).  Assessment: 38 y.o. male with h/o Afib, Eliquis  on hold for CRRT, for argatroban  due to h/o HIT. Remains on CRRT. HgB 9.8 and PLTs 281, aPTT downtrending but remains elevated at 61. No baseline aPTT from this admission, T-bili continues to increase to 20.7, noted yellowing of sclera on exam. Argatroban  off since 6/13 around noon. Unclear if aPTT elevated due to liver dysfunction/congestion, underlying coagulopathy, or due to argatroban . Half life of argatroban  even with liver dysfunction approximately 190 minutes so would anticipate this to be out of patient's system given it has been off since 6/13. T-bili during last admission around 3 with no baseline aPTT reading. INR upon admission 11/2023 was 3.5 INR this admission was 2.9 but noted on Eliquis  PTA. Patient's baseline suspected to be around 55. Due to liver impairment will transition to bival while on CRRT. In addition to Afib, patient high risk for PE with weight, low EF, and minimal movement.    Bivalrudin has been off since 6/23 @ 20:00. Half life bival while on dialysis approximately 3.5 hours. Plan was to resume 6/26 however patient had some bloody emesis and continued to hold.   aPTT 105 sec (slightly supratherapeutic) on infusion at 0.02 mg/kg/min. No further bleeding noted.  Goal of Therapy:  aPTT: 80-100, 1.5x baseline of approx 55-60, lower end range w/ recent bleeding  Monitor platelets by anticoagulation protocol: Yes   Plan:  Decrease bivalrudin to 0.018 mg/kg/min utilizing adjusted body weight Recheck aPTT in 4 hours  Vito Ralph, PharmD, BCPS Please see amion for complete clinical pharmacist phone list 01/20/24 8:01 PM

## 2024-01-20 NOTE — Progress Notes (Signed)
 Occupational Therapy Treatment Patient Details Name: Phillip Young MRN: [id number] DOB: [birth year] Today's Date: 01/20/2024   History of present illness 38 yo male adm  01/03/24 for SOB, chills, and incr weeping from lt thigh blister. Pt was dc'd from [facility] on same date. Pt with shock  from decompensated HF. CRRT 6/10-6/19, restarted 6/23. PMH - ESRD on HD, chronic resp failure, morbid obesity, NICM, OSA, chronic pain, chronic hypotension.   OT comments  Pt premedicated with IV pain meds prior to OT session though abdominal pain still reported w/ pt declining to attempt tilting. With max encouragement, pt able to demo basic UB exercises bed level, Max A for washing face and only tolerated HOB to approx 35*. [name] mother at bedside, supportive during session. Plan to provide UE HEP with theraband in next OT session. Continue to recommend LTACH given pt's medical complexities.       If plan is discharge home, recommend the following:  Two people to help with walking and/or transfers;A lot of help with bathing/dressing/bathroom;Assistance with cooking/housework;Direct supervision/assist for financial management;Direct supervision/assist for medications management;Assist for transportation;Help with stairs or ramp for entrance   Equipment Recommendations  Other (comment) (TBD)    Recommendations for Other Services      Precautions / Restrictions Precautions Precautions: Fall;Other (comment) Recall of Precautions/Restrictions: Impaired Precaution/Restrictions Comments: many wounds Restrictions Weight Bearing Restrictions Per Provider Order: No       Mobility Bed Mobility               General bed mobility comments: declined to tilt    Transfers                         Balance                                           ADL either performed or assessed with clinical judgement   ADL Overall ADL's : Needs assistance/impaired     Grooming: Maximal  assistance;Bed level;Wash/dry face Grooming Details (indicate cue type and reason): Max A to wash face with hand over hand to bring UE to face w/ forearm support. pt able to minimally wipe nose when cued. Unsure if increased assist needed due to weakness and/or pt decreased motivation to attempt task                                    Extremity/Trunk Assessment Upper Extremity Assessment Upper Extremity Assessment: Generalized weakness;Difficult to assess due to impaired cognition;Right hand dominant   Lower Extremity Assessment Lower Extremity Assessment: Defer to PT evaluation        Vision   Vision Assessment?: No apparent visual deficits   Perception     Praxis     Communication Communication Communication: Impaired Factors Affecting Communication: Difficulty expressing self;Reduced clarity of speech   Cognition Arousal: Lethargic Behavior During Therapy: Flat affect Cognition: Difficult to assess Difficult to assess due to: Level of arousal           OT - Cognition Comments: some word finding difficulties noted, difficult to fully assess due to pt soft spoken and lethargic today                 Following commands: Impaired Following commands impaired: Follows one step commands inconsistently, Follows one step  commands with increased time      Cueing   Cueing Techniques: Verbal cues, Gestural cues, Tactile cues  Exercises Exercises: General Upper Extremity General Exercises - Upper Extremity Shoulder Flexion: Strengthening, Both, 5 reps Shoulder Horizontal ABduction: Strengthening, Both, 5 reps Elbow Flexion: Strengthening, Both, 5 reps    Shoulder Instructions       General Comments Mother at bedside, pleasant during session    Pertinent Vitals/ Pain       Pain Assessment Pain Assessment: Faces Faces Pain Scale: Hurts whole lot Pain Location: abdomen Pain Descriptors / Indicators: Grimacing, Guarding Pain Intervention(s):  Monitored during session, Limited activity within patient's tolerance, Premedicated before session  Home Living                                          Prior Functioning/Environment              Frequency  Min 1X/week        Progress Toward Goals  OT Goals(current goals can now be found in the care plan section)  Progress towards OT goals: Not progressing toward goals - comment  Acute Rehab OT Goals Patient Stated Goal: put my head back OT Goal Formulation: With patient Time For Goal Achievement: 01/23/24 Potential to Achieve Goals: Fair ADL Goals Pt Will Perform Eating: with supervision;sitting Pt Will Perform Grooming: with supervision;sitting Pt Will Perform Upper Body Dressing: sitting;with contact guard assist Pt Will Transfer to Toilet: with mod assist;stand pivot transfer;bedside commode Pt/caregiver will Perform Home Exercise Program: Both right and left upper extremity;Increased ROM;Increased strength;With written HEP provided;With Supervision Additional ADL Goal #1: pt will perform bed mobility mod A in prep for ADLs Additional ADL Goal #2: Pt will tolerate OOB to recliner x1 hour in prep for ADLs  Plan      Co-evaluation                 AM-PAC OT 6 Clicks Daily Activity     Outcome Measure   Help from another person eating meals?: A Little Help from another person taking care of personal grooming?: A Lot Help from another person toileting, which includes using toliet, bedpan, or urinal?: Total Help from another person bathing (including washing, rinsing, drying)?: Total Help from another person to put on and taking off regular upper body clothing?: A Lot Help from another person to put on and taking off regular lower body clothing?: Total 6 Click Score: 10    End of Session Equipment Utilized During Treatment: Oxygen   OT Visit Diagnosis: Unsteadiness on feet (R26.81);Other abnormalities of gait and mobility (R26.89);Muscle  weakness (generalized) (M62.81)   Activity Tolerance Patient limited by pain;Patient limited by lethargy   Patient Left in bed;with call bell/phone within reach;with family/visitor present   Nurse Communication Mobility status        Time: 8965-8941 OT Time Calculation (min): 24 min  Charges: OT General Charges $OT Visit: 1 Visit OT Treatments $Therapeutic Activity: 8-22 mins  Mliss NOVAK, OTR/L Acute Rehab Services Office: [phone]   Mliss Fish 01/20/2024, 11:21 AM

## 2024-01-20 NOTE — Progress Notes (Addendum)
 [facility] NEPHROLOGY PROGRESS NOTE  Summary: Pt is a 38 y.o. yo male with past medical history of biventricular heart failure EF less than 20, chronic A-fib, ESRD on HD, chronic hypotension on high-dose midodrine , nonadherence with outpatient HD and treatment, recently at [facility] presented with shock and volume overload.  Subjective:   Seen in ICU bed Pt more alert, almost off pressors CRRT dc'd yesterday  Objective Vital signs in last 24 hours: Vitals:   01/20/24 0915 01/20/24 0930 01/20/24 0945 01/20/24 1000  BP: (!) 80/38 92/68 (!) 96/52 (!) 88/65  Pulse: 87 99 98 80  Resp: (!) 28 (!) 26 (!) 29 (!) 27  Temp:      TempSrc:      SpO2: 94% 97% 95% 94%  Weight:      Height:       Physical Exam: General:NAD, lying flat, NG tube in  Heart:RRR, s1s2 nl Lungs: Bilateral distant breath sound Abdomen:soft, mildly distended Extremities: 1+ bilat LE edema, bilat upper thighs w/ skin hardening/ blackening in serpentine pattern w/ some skin sloughing Dialysis Access: TDC  OP HD: MWF [facility] 4.5h   B400  180kg  TDC  NO heparin  (h/o HIT) Last OP HD 5/09, post wt 190kg  Assessment/ Plan # Shock, cardiogenic:  12/06/23 EF < 20%. Sepsis less likely with neg cultures, abx off. Not a candidate for advanced therapies  # ESRD:  on HD since 01/2021. [facility] MWF, noncompliant with outpatient HD with chronic hyperkalemia.  Was on CRRT 6/10- 6/19, then was dc'd. He remained too unstable for iHD. Discussed EOL issues 6/23 w/ pt's mother and we agreed for a 3 day trial of CRRT which ended yesterday. Plan is for iHD as tolerated, no further CRRT. BP's better and pressors mostly off, will plan for iHD in ICU Sat.   # Anemia of ESRD: cont weekly darbe sq 150 mcg . Transfuse as needed.  # Chronic hypotension/volume: on midodrine  40mg  tid, also levo 1-2 this am. BP's improved. Will not pull fluid w/ HD tomorrow.   # a fib: HR has been running in the 110s-130s  #HIT: avoiding  heparin   #Elevated Tbili: with synthetic dysfunction  # Possible calciphylaxis: bilat anterior thighs w/ sig areas of skin hardening + darkening w/ associated sloughing. Potentially this could be calciphylaxis. Avoid Ca/ vit D products. Started on IV sodium thiosulfate  tiw.    Myer Fret  MD  [facility] 01/20/2024, 10:15 AM  Recent Labs  Lab 01/19/24 0255 01/19/24 1600 01/20/24 0415 01/20/24 0416  HGB 8.9*  --  8.6*  --   ALBUMIN  <1.5*  <1.5* <1.5* <1.5* <1.5*  CALCIUM  8.2* 7.9*  --  8.7*  PHOS 3.3 3.4  --  3.0  CREATININE 2.11* 1.84*  --  2.32*  K 4.0 3.7  --  4.1    Inpatient medications:  Chlorhexidine  Gluconate Cloth  6 each Topical Daily   Darbepoetin Alfa   150 mcg Subcutaneous Q7 days   feeding supplement (NEPRO CARB STEADY)  237 mL Oral BID BM   leptospermum manuka honey  1 Application Topical Daily   liver oil-zinc  oxide   Topical BID   midodrine   40 mg Oral TID WC   mouth rinse  15 mL Mouth Rinse 4 times per day   pantoprazole  (PROTONIX ) IV  40 mg Intravenous Q12H   ursodiol   300 mg Oral BID   vancomycin  variable dose per unstable renal function (pharmacist dosing)   Does not apply See admin instructions    anticoagulant  sodium citrate      bivalirudin  (ANGIOMAX ) 250 mg in sodium chloride  0.9 % 500 mL (0.5 mg/mL) infusion     norepinephrine  (LEVOPHED ) Adult infusion 3 mcg/min (01/20/24 0800)   PrismaSol  BGK 2/3.5 1,500 mL/hr at 01/19/24 1619   prismasol  BGK 4/2.5 400 mL/hr at 01/19/24 1211   prismasol  BGK 4/2.5 400 mL/hr at 01/19/24 1245   sodium thiosulfate  25 g in sodium chloride  0.9 % 200 mL Infusion for Calciphylaxis Stopped (01/19/24 1820)   anticoagulant sodium citrate , camphor-menthol , HYDROmorphone  (DILAUDID ) injection, ondansetron  (ZOFRAN ) IV, mouth rinse

## 2024-01-20 NOTE — Progress Notes (Addendum)
 PHARMACY - ANTICOAGULATION  Pharmacy Consult for bivalirudin    Indication: atrial fibrillation w/ hx of HIT   Patient Measurements: Height: 6' (182.9 cm) Weight: (!) 162.6 kg (358 lb 7.5 oz) IBW/kg (Calculated) : 77.6 HEPARIN  DW (KG): 125.9  Vital Signs: Temp: 97.6 F (36.4 C) (06/27 0407) Temp Source: Axillary (06/27 0407) BP: 109/85 (06/27 0830) Pulse Rate: 84 (06/27 0815)  Labs: Recent Labs     0000 01/18/24 0833 01/18/24 1447 01/18/24 2009 01/19/24 0255 01/19/24 0925 01/19/24 1600 01/20/24 0415 01/20/24 0416  HGB   < >  --  8.9*  --  8.9*  --   --  8.6*  --   HCT  --   --  25.8*  --  25.5*  --   --  25.8*  --   PLT  --   --  107*  --  119*  --   --  97*  --   APTT  --  60*  --   --   --  70*  --  66*  --   CREATININE  --   --   --    < > 2.11*  --  1.84*  --  2.32*   < > = values in this interval not displayed.    Estimated Creatinine Clearance: 68.1 mL/min (A) (by C-G formula based on SCr of 2.32 mg/dL (H)).  Assessment: 38 y.o. male with h/o Afib, Eliquis  on hold for CRRT, for argatroban  due to h/o HIT. Remains on CRRT. HgB 9.8 and PLTs 281, aPTT downtrending but remains elevated at 61. No baseline aPTT from this admission, T-bili continues to increase to 20.7, noted yellowing of sclera on exam. Argatroban  off since 6/13 around noon. Unclear if aPTT elevated due to liver dysfunction/congestion, underlying coagulopathy, or due to argatroban . Half life of argatroban  even with liver dysfunction approximately 190 minutes so would anticipate this to be out of patient's system given it has been off since 6/13. T-bili during last admission around 3 with no baseline aPTT reading. INR upon admission 11/2023 was 3.5 INR this admission was 2.9 but noted on Eliquis  PTA. Patient's baseline suspected to be around 55. Due to liver impairment will transition to bival while on CRRT. In addition to Afib, patient high risk for PE with weight, low EF, and minimal movement.   Bivalrudin has  been off since 6/23 @ 20:00. Half life bival while on dialysis approximately 3.5 hours.Plan was to resume 6/26 however patient has some bloody emesis and continued to hold.   HgB remains stable. Per discussion with Dr. Annella, plan to resume bivalrudin today. CRRT stopped 6/26 at 1822 PM. aPTT this AM remains elevated at 66 (past baseline aPTT was 60 while on CRRT). Tbili trending down some at 19.3 today.  Goal of Therapy:  aPTT: 80-100, 1.5x baseline of approx 55-60, lower end range w/ recent bleeding  Monitor platelets by anticoagulation protocol: Yes   Plan:  Restart low dose bivalrudin at 0.02mg /kg/min utilizing adjusted body weight Recheck aPTT in 4 hours.  BID aPTT when therapeutic Daily CBC F/u s/sx for additional bleeding.   Addendum: 1400 aPTT is therapeutic for upper end of goal at 99. Note RN just moved infusion from draw site to a PIV. Draw site was flushed prior to level being drawn.   Plan: Continue current rate of bivalrudin 0.2 mg/kg/min utilizing adjusted body weight. Recheck aPTT in 4 hours.   Harlene Boga, PharmD, BCPS, BCCCP Clinical Pharmacist Please refer to [facility] for [facility] Pharm[facility]  numbers 01/20/24 9:12 AM

## 2024-01-20 NOTE — Plan of Care (Signed)
   Medical records reviewed including progress notes, labs, imaging. Discussed with MD.  Goals of care are clear for aggressive life-prolonging care.  Patient has weaned off pressors and SBO resolving with NGT removed today.  Remains to be seen whether he will tolerate iHD.    PMT will follow peripherally at this time. Thank you for your referral and allowing PMT to assist in [name] care.    Evany Schecter, PA-C Palliative Medicine Team  Team Phone # [phone]   NO CHARGE

## 2024-01-20 NOTE — Telephone Encounter (Signed)
 Called patient lvm- call is in regards to [facility] / needing to know if patient has in home assessment appointment-if so date of appointment and the name of the agency  if receiving  care. Waiting call back.

## 2024-01-20 NOTE — Progress Notes (Signed)
 NAME:  Phillip Young, MRN:  [id number], DOB:  May 26, 1986, LOS: 17 ADMISSION DATE:  01/03/2024, CONSULTATION DATE:  01/03/24 REFERRING MD:  Dr. Patsey, CHIEF COMPLAINT:  SOB/ chills   History of Present Illness:  89 yoM w/PMH as below significant for biventricular HF (EF < 20%), chronic afib on Eliquis , chronic hypotension on midodrine  30mg  TID, ESRD, chronic hypoxic and hypercarbic respiratory failure on NIV, morbid obesity, medical non-compliance, and chronic pain presenting from home after being discharged from [facility] 6/9 with SOB, chills, subjective fever, ongoing poor PO, vomiting today strings of blood but then reported it was with coughing episodes, pain all over, and increased weeping from left thigh blister.  Had BM, unclear on consistency/ color.  Reports last dialysis yesterday.  Initially reports not taking any PO meds today but then said he did this morning.  Non- ambulatory at baseline.  Frequent admits, most recently 5/13-5/22 for recurrent septic shock and MRSE bacteremia due to suspected line complicated by acute on chronic HF and respiratory failure, suspected IE but not a candidate for TEE, discharged on vancomycin  with HD scheduled thru 6/30.  TDC was replaced 5/22 after 2 day line holiday.  Also started on micafungin  w/HD at [facility] after [facility] grew candida species, scheduled thru 6/30.   In ER, temp 99.1, on HFNC at 6L, with SBP into 70's requiring NE support after 500 ml bolus.  Labs significant for Na 129, K 6.3, Cl 89, alk phos 346, AST 84, t.bili 12 (recent 3.4 on 5/14), lactic 3.8, WBC 20.6 (previously 8.1 on 5/22), INR 2.9, CXR showing vascular congestion and increasing congestion and pleural effusions.  BC sent and empirically started on linezolid , flagyl , and cefepime .  Pending CT chest/abd/ pelvis.  Nephrology consulted.  PCCM called for admit.   Pertinent  Medical History   Past Medical History:  Diagnosis Date   Acute on chronic respiratory failure with hypoxia ([name])  04/21/2021   Acute on chronic systolic (congestive) heart failure ([name]) 02/26/2020   Amiodarone  toxicity    Anemia    Biventricular congestive heart failure ([name])    Candida parapsilosis infection 12/26/2023   Chronic hypoxemic respiratory failure ([name])    Class 3 severe obesity due to excess calories with serious comorbidity and body mass index (BMI) of 50.0 to 59.9 in adult 02/26/2020   ESRD on hemodialysis [facility])    Essential hypertension 02/26/2020   GERD without esophagitis 02/26/2020   Hidradenitis suppurativa 02/26/2020   NICM (nonischemic cardiomyopathy) ([name])    Obesity hypoventilation syndrome ([name])    OSA (obstructive sleep apnea)    Pneumonia    Prediabetes 02/26/2020   Significant Hospital Events: Including procedures, antibiotic start and stop dates in addition to other pertinent events   6/10 Admit 6/12 Remains on vasopressors.  Spoke to heart failure.  Not a candidate for advanced therapies.  Not a candidate for tailored hemodynamics.  Recommend a strategy of vasopressor support at fluid removal in an attempt to transition back to IHD. 6/15 patient brought up discussion of limiting some of his care; Palliative re-consulted.  6/16 palliative consulted per patient's request. CT unrevealing for cause of abdominal pain. 6/23 nephro decided to restart CRRT despite prior nephro stopping. Remains hypotensive, mother did not consent central line, PICC delayed, peripheral pressors and resume CRRT, developed ileus vs SBO, NG placed to [facility] 6/24 PICC could not be placed, mother refused central line again 6/25 central line placed  Interim History / Subjective:  On low dose NE weaning, remove NG as  having BM  and tolerating clears.  Objective    Blood pressure (!) 88/65, pulse 80, temperature 97.6 F (36.4 C), temperature source Axillary, resp. rate (!) 27, height 6' (1.829 m), weight (!) 162.6 kg, SpO2 94%.        Intake/Output Summary (Last 24 hours) at 01/20/2024 1053 Last  data filed at 01/20/2024 0800 Gross per 24 hour  Intake 950.8 ml  Output 478 ml  Net 472.8 ml   Filed Weights   01/17/24 0500 01/18/24 0500 01/19/24 0500  Weight: (!) 159.7 kg (!) 162.8 kg (!) 162.6 kg   Examination: Chronically ill appearing obese man, laying flat On nasal cannula Breath and lung sounds diminished RRR No wheeze Abdomen obese, soft Non pitting peripheral edema vs lymphedema Faint breathy and soft voice, very weak, unable to sit up independently, alert but disoriented, does not reliably follow commands   Labs and images reviewed   Resolved problem list:    Assessment and Plan  Shock ([name])> suspect primarily cardiogenic Continue vasopressor support with Levophed , MAP goal 60 or SBP 90 Continue IV vancomycin  to treat cellulitis end 6/30 Not a candidate for advanced heart failure therapy Patient mother refused solucortef 6/23 for possible adrenal insufficiency  Central line placed 6/25, cap pressors at NE max dose 40 Continue midodrine    Hyperbilirubinemia-- POA and present 5/25 now worsened, congestive vs low forward flow state. 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 No signs of obstruction.  Acaculous cholecystitis is possible> no suggestion of this on any of his imaging. Patent hepatic portal veins so no Budd Chiari. Low suspicion for DILI with normal transaminases and no obvious culprit.  Continue trial of ursodiol , bili seems trending down  Acute on chronic biventricular congestive heart failure ([name])-combined sysolic & diastolic; known NICM. Echo shows severe RV and LV failure with EF <20%. Non-compliant for many years. Per advanced heart failure, patient is not a candidate for advanced therapy Unable to tolerate GDMT in the setting of  hypotension CRRT was stopped, still requiring vasopressor support, would not tolerate IHD, CRRT resumed time limited trial 6/23 for 3 days to end AM 6/25 If he is unable to wean off pressors, hospice care will be the only reasonable option.  Right now I think it is highly unlikely he will be able to resume iHD with his current status. Palliative care reconsulted and following.  ESRD on hemodialysis CRRT was stopped, discussed with nephro utility to resume, time limited trial again after discussion 6/23 Unable to tolerate HD due to high norepinephrine  needs As such unless hemodynamics improve there is no option for dialysis long term  Constipation SBO vs Ileus on plain film 6/23 Had BM 6/21. Continue to offer bowel regimen NG placed overnight to 6/24, LIWS, repeat plain film in slightly imrpoved on my review, rads read as unchanged Had BM overnight, trial clears  Obesity hypoventilation syndrome - Intermittently compliant with home BIPAP. Recommend at bedtime or when sleeping otherwise he use bipap; patient intermittently declining   Permanent atrial fibrillation  Amiodarone  intolerant Heart rate is controlled Bival stopped with bleeding wounds 6/24, resume 6/25 ad hgb stable and no further bleeding Due to hyperbilirubinemia, can not switch him to Eliquis   Check digoxin  level to aid in re-dosing  Chronic pain disorder Continue pregabalin  Oxycodone  and Dilaudid   stopped considering low  blood pressure  Mild hyperammonemia Lactulose  BID; he has been refusing  Skin ulceration; recent cellulitis  Has chronic skin ulceration, and he routinely has been refusing dressing changes. He understands he will not be able to make progress if we cannot keep his wounds clean. On examination: multiple areas of covered wound but no drainage or discoloration.  On Vanc- dosing per pharmacy - to end 6/30  Long term prognosis remains grim. No good options if he is not able to tolerate iHD. Appreciate  Palliative care team's management. Most likely outcome is in hospital death. GOC remain full code yet at times refuse appropriate and aggressive medical care as recommended. Not amenable currently to discussion about imminent death.    Best Practice (right click and Reselect all SmartList Selections daily)   Diet/type: Regular consistency (see orders) DVT prophylaxis SCD, bival gtt Pressure ulcer(s): present on admission  GI prophylaxis: PPI Lines: Dialysis Catheter-pta R TDC (12/15/23) Foley:  N/A Code Status:  full code Last date of multidisciplinary goals of care discussion - discussed with mom bedside 6/25, discussed with CMO Gordy Pina, MD 6/23.   CRITICAL CARE Performed by: Donnice SAUNDERS Deshawnda Acrey   Total critical care time: 31 minutes  Critical care time was exclusive of separately billable procedures and treating other patients.  Critical care was necessary to treat or prevent imminent or life-threatening deterioration.  Critical care was time spent personally by me on the following activities: development of treatment plan with patient and/or surrogate as well as nursing, discussions with consultants, evaluation of patient's response to treatment, examination of patient, obtaining history from patient or surrogate, ordering and performing treatments and interventions, ordering and review of laboratory studies, ordering and review of radiographic studies, pulse oximetry and re-evaluation of patient's condition.      Donnice SAUNDERS Lorelie Biermann [facility] 01/20/2024 10:53 AM  Pager: see AMION  If no response to pager , please call critical care on call (see AMION) until 7pm After 7:00 pm call Elink

## 2024-01-21 DIAGNOSIS — N186 End stage renal disease: Secondary | ICD-10-CM | POA: Diagnosis not present

## 2024-01-21 DIAGNOSIS — Z992 Dependence on renal dialysis: Secondary | ICD-10-CM | POA: Diagnosis not present

## 2024-01-21 DIAGNOSIS — R579 Shock, unspecified: Secondary | ICD-10-CM | POA: Diagnosis not present

## 2024-01-21 LAB — BASIC METABOLIC PANEL WITH GFR
Anion gap: 20 — ABNORMAL HIGH (ref 5–15)
BUN: 47 mg/dL — ABNORMAL HIGH (ref 6–20)
CO2: 18 mmol/L — ABNORMAL LOW (ref 22–32)
Calcium: 8.6 mg/dL — ABNORMAL LOW (ref 8.9–10.3)
Chloride: 97 mmol/L — ABNORMAL LOW (ref 98–111)
Creatinine, Ser: 3.34 mg/dL — ABNORMAL HIGH (ref 0.61–1.24)
GFR, Estimated: 23 mL/min — ABNORMAL LOW (ref >60)
Glucose, Bld: 66 mg/dL — ABNORMAL LOW (ref 70–99)
Potassium: 4.3 mmol/L (ref 3.5–5.1)
Sodium: 135 mmol/L (ref 135–145)

## 2024-01-21 LAB — HEPATIC FUNCTION PANEL
ALT: 42 U/L (ref 0–44)
AST: 52 U/L — ABNORMAL HIGH (ref 15–41)
Albumin: <1.5 g/dL — ABNORMAL LOW (ref 3.5–5.0)
Alkaline Phosphatase: 191 U/L — ABNORMAL HIGH (ref 38–126)
Bilirubin, Direct: 12.7 mg/dL — ABNORMAL HIGH (ref 0.0–0.2)
Indirect Bilirubin: 8.1 mg/dL — ABNORMAL HIGH (ref 0.3–0.9)
Total Bilirubin: 20.8 mg/dL (ref 0.0–1.2)
Total Protein: 6.3 g/dL — ABNORMAL LOW (ref 6.5–8.1)

## 2024-01-21 LAB — APTT
aPTT: 101 s — ABNORMAL HIGH (ref 24–36)
aPTT: 108 s — ABNORMAL HIGH (ref 24–36)
aPTT: 109 s — ABNORMAL HIGH (ref 24–36)

## 2024-01-21 LAB — CBC
HCT: 23.7 % — ABNORMAL LOW (ref 39.0–52.0)
Hemoglobin: 8.2 g/dL — ABNORMAL LOW (ref 13.0–17.0)
MCH: 29.9 pg (ref 26.0–34.0)
MCHC: 34.6 g/dL (ref 30.0–36.0)
MCV: 86.5 fL (ref 80.0–100.0)
Platelets: 91 10*3/uL — ABNORMAL LOW (ref 150–400)
RBC: 2.74 MIL/uL — ABNORMAL LOW (ref 4.22–5.81)
RDW: 22.1 % — ABNORMAL HIGH (ref 11.5–15.5)
WBC: 39.6 10*3/uL — ABNORMAL HIGH (ref 4.0–10.5)
nRBC: 0.3 % — ABNORMAL HIGH (ref 0.0–0.2)

## 2024-01-21 LAB — DIGOXIN LEVEL: Digoxin Level: 0.8 ng/mL (ref 0.8–2.0)

## 2024-01-21 MED ORDER — VANCOMYCIN HCL IN DEXTROSE 1-5 GM/200ML-% IV SOLN: 1000.0000 mg | INTRAVENOUS | Status: DC

## 2024-01-21 MED ORDER — VANCOMYCIN HCL IN DEXTROSE 1-5 GM/200ML-% IV SOLN
1000.0000 mg | INTRAVENOUS | Status: AC
  Administered 2024-01-22: 1000 mg via INTRAVENOUS
  Filled 2024-01-21: qty 200

## 2024-01-21 MED ORDER — HYDROMORPHONE HCL 1 MG/ML IJ SOLN
0.5000 mg | Freq: Once | INTRAMUSCULAR | Status: AC
  Administered 2024-01-21: 0.5 mg via INTRAVENOUS

## 2024-01-21 NOTE — Progress Notes (Signed)
 PHARMACY - ANTICOAGULATION  Pharmacy Consult for bivalirudin    Indication: atrial fibrillation w/ hx of HIT   Patient Measurements: Height: 6' (182.9 cm) Weight: (!) 164.3 kg (362 lb 3.5 oz) IBW/kg (Calculated) : 77.6 HEPARIN  DW (KG): 125.9  Vital Signs: Temp: 98.3 F (36.8 C) (06/28 1932) Temp Source: Oral (06/28 1932) BP: 98/66 (06/28 2215) Pulse Rate: 103 (06/28 2215)  Labs: Recent Labs    01/19/24 0255 01/19/24 0925 01/19/24 1600 01/20/24 0415 01/20/24 0416 01/20/24 1400 01/21/24 0352 01/21/24 0721 01/21/24 1353 01/21/24 2000  HGB 8.9*  --   --  8.6*  --   --  8.2*  --   --   --   HCT 25.5*  --   --  25.8*  --   --  23.7*  --   --   --   PLT 119*  --   --  97*  --   --  91*  --   --   --   APTT  --    < >  --  66*  --    < > 101*  --  108* 109*  CREATININE 2.11*  --  1.84*  --  2.32*  --   --  3.34*  --   --    < > = values in this interval not displayed.    Estimated Creatinine Clearance: 47.6 mL/min (A) (by C-G formula based on SCr of 3.34 mg/dL (H)).  Assessment: 38 y.o. male with h/o Afib, Eliquis  on hold for CRRT, for argatroban  due to h/o HIT. Remains on CRRT. HgB 9.8 and PLTs 281, aPTT downtrending but remains elevated at 61. No baseline aPTT from this admission, T-bili continues to increase to 20.7, noted yellowing of sclera on exam. Argatroban  off since 6/13 around noon. Unclear if aPTT elevated due to liver dysfunction/congestion, underlying coagulopathy, or due to argatroban . Half life of argatroban  even with liver dysfunction approximately 190 minutes so would anticipate this to be out of patient's system given it has been off since 6/13. T-bili during last admission around 3 with no baseline aPTT reading. INR upon admission 11/2023 was 3.5 INR this admission was 2.9 but noted on Eliquis  PTA. Patient's baseline suspected to be around 55. Due to liver impairment will transition to bival while on CRRT. In addition to Afib, patient high risk for PE with weight,  low EF, and minimal movement.   Bivalrudin has been off since 6/23 @ 20:00. Half life bival while on dialysis approximately 3.5 hours. Plan was to resume 6/26 however patient had some bloody emesis and continued to hold.   aPTT 101 sec (slightly supratherapeutic) on infusion at 0.018 mg/kg/min. No further bleeding noted. Noted liver dysfunction remains stable - will f/u with CCM about conformability of utilizing DOAC.   6/28 PM - aPTT 109 sec increased from last check despite rate decrease to 0.016 mg/kg/min.    Goal of Therapy:  aPTT: 80-100, 1.5x baseline of approx 55-60, lower end range w/ recent bleeding  Monitor platelets by anticoagulation protocol: Yes   Plan:  Decrease bivalrudin to 0.011 mg/kg/min utilizing adjusted body weight Recheck aPTT in 4 hours Potential or iHD today - would anticipate this to impact dosing requirements.   Maurilio Fila, PharmD Clinical Pharmacist 01/21/2024  11:12 PM

## 2024-01-21 NOTE — Progress Notes (Addendum)
 PHARMACY - ANTICOAGULATION  Pharmacy Consult for bivalirudin    Indication: atrial fibrillation w/ hx of HIT   Patient Measurements: Height: 6' (182.9 cm) Weight: (!) 164.3 kg (362 lb 3.5 oz) IBW/kg (Calculated) : 77.6 HEPARIN  DW (KG): 125.9  Vital Signs: Temp: 98.2 F (36.8 C) (06/28 0400) Temp Source: Axillary (06/28 0400) BP: 148/105 (06/28 0630) Pulse Rate: 85 (06/28 0630)  Labs: Recent Labs    01/19/24 0255 01/19/24 0925 01/19/24 1600 01/20/24 0415 01/20/24 0416 01/20/24 1400 01/20/24 1800 01/21/24 0352  HGB 8.9*  --   --  8.6*  --   --   --  8.2*  HCT 25.5*  --   --  25.8*  --   --   --  23.7*  PLT 119*  --   --  97*  --   --   --  91*  APTT  --    < >  --  66*  --  99* 105* 101*  CREATININE 2.11*  --  1.84*  --  2.32*  --   --   --    < > = values in this interval not displayed.    Estimated Creatinine Clearance: 68.6 mL/min (A) (by C-G formula based on SCr of 2.32 mg/dL (H)).  Assessment: 38 y.o. male with h/o Afib, Eliquis  on hold for CRRT, for argatroban  due to h/o HIT. Remains on CRRT. HgB 9.8 and PLTs 281, aPTT downtrending but remains elevated at 61. No baseline aPTT from this admission, T-bili continues to increase to 20.7, noted yellowing of sclera on exam. Argatroban  off since 6/13 around noon. Unclear if aPTT elevated due to liver dysfunction/congestion, underlying coagulopathy, or due to argatroban . Half life of argatroban  even with liver dysfunction approximately 190 minutes so would anticipate this to be out of patient's system given it has been off since 6/13. T-bili during last admission around 3 with no baseline aPTT reading. INR upon admission 11/2023 was 3.5 INR this admission was 2.9 but noted on Eliquis  PTA. Patient's baseline suspected to be around 55. Due to liver impairment will transition to bival while on CRRT. In addition to Afib, patient high risk for PE with weight, low EF, and minimal movement.   Bivalrudin has been off since 6/23 @ 20:00.  Half life bival while on dialysis approximately 3.5 hours. Plan was to resume 6/26 however patient had some bloody emesis and continued to hold.   aPTT 101 sec (slightly supratherapeutic) on infusion at 0.018 mg/kg/min. No further bleeding noted. Noted liver dysfunction remains stable - will f/u with CCM about conformability of utilizing DOAC.   Goal of Therapy:  aPTT: 80-100, 1.5x baseline of approx 55-60, lower end range w/ recent bleeding  Monitor platelets by anticoagulation protocol: Yes   Plan:  Decrease bivalrudin to 0.016 mg/kg/min utilizing adjusted body weight Recheck aPTT in 4 hours Potential or iHD today - would anticipate this to impact dosing requirements.   Powell Blush, PharmD, BCCCP  Please see amion for complete clinical pharmacist phone list 01/21/24 7:19 AM

## 2024-01-21 NOTE — Progress Notes (Signed)
 PHARMACY - ANTICOAGULATION  Pharmacy Consult for bivalirudin    Indication: atrial fibrillation w/ hx of HIT   Patient Measurements: Height: 6' (182.9 cm) Weight: (!) 164.3 kg (362 lb 3.5 oz) IBW/kg (Calculated) : 77.6 HEPARIN  DW (KG): 125.9  Vital Signs: Temp: 97.5 F (36.4 C) (06/28 1124) Temp Source: Oral (06/28 1124) BP: 81/60 (06/28 1415) Pulse Rate: 107 (06/28 1415)  Labs: Recent Labs    01/19/24 0255 01/19/24 0925 01/19/24 1600 01/20/24 0415 01/20/24 0416 01/20/24 1400 01/20/24 1800 01/21/24 0352 01/21/24 0721 01/21/24 1353  HGB 8.9*  --   --  8.6*  --   --   --  8.2*  --   --   HCT 25.5*  --   --  25.8*  --   --   --  23.7*  --   --   PLT 119*  --   --  97*  --   --   --  91*  --   --   APTT  --    < >  --  66*  --    < > 105* 101*  --  108*  CREATININE 2.11*  --  1.84*  --  2.32*  --   --   --  3.34*  --    < > = values in this interval not displayed.    Estimated Creatinine Clearance: 47.6 mL/min (A) (by C-G formula based on SCr of 3.34 mg/dL (H)).  Assessment: 38 y.o. male with h/o Afib, Eliquis  on hold for CRRT, for argatroban  due to h/o HIT. Remains on CRRT. HgB 9.8 and PLTs 281, aPTT downtrending but remains elevated at 61. No baseline aPTT from this admission, T-bili continues to increase to 20.7, noted yellowing of sclera on exam. Argatroban  off since 6/13 around noon. Unclear if aPTT elevated due to liver dysfunction/congestion, underlying coagulopathy, or due to argatroban . Half life of argatroban  even with liver dysfunction approximately 190 minutes so would anticipate this to be out of patient's system given it has been off since 6/13. T-bili during last admission around 3 with no baseline aPTT reading. INR upon admission 11/2023 was 3.5 INR this admission was 2.9 but noted on Eliquis  PTA. Patient's baseline suspected to be around 55. Due to liver impairment will transition to bival while on CRRT. In addition to Afib, patient high risk for PE with weight,  low EF, and minimal movement.   Bivalrudin has been off since 6/23 @ 20:00. Half life bival while on dialysis approximately 3.5 hours. Plan was to resume 6/26 however patient had some bloody emesis and continued to hold.   aPTT 108 sec (slightly supratherapeutic) on infusion at 0.016 mg/kg/min. Confirmed drawn away from bival. No further bleeding noted. Noted liver dysfunction remains stable - will f/u with CCM about conformability of utilizing DOAC.   Goal of Therapy:  aPTT: 80-100, 1.5x baseline of approx 55-60, lower end range w/ recent bleeding  Monitor platelets by anticoagulation protocol: Yes   Plan:  Decrease bivalrudin to 0.014 mg/kg/min utilizing adjusted body weight Recheck aPTT in 4 hours Potential or iHD today - would anticipate this to impact dosing requirements.  Discussed transition to DOAC today with CCM - will continue to hold with T-bili of around 20. Child Pugh score of C- DOACs not recommended per package insert. Would consider if continues to downtrend.   Powell Blush, PharmD, BCCCP  Please see amion for complete clinical pharmacist phone list 01/21/24 2:35 PM

## 2024-01-21 NOTE — Plan of Care (Signed)
  Problem: Education: Goal: Knowledge of General Education information will improve Description: Including pain rating scale, medication(s)/side effects and non-pharmacologic comfort measures Outcome: Progressing   Problem: Clinical Measurements: Goal: Diagnostic test results will improve Outcome: Progressing Goal: Respiratory complications will improve Outcome: Progressing Goal: Cardiovascular complication will be avoided Outcome: Progressing   Problem: Elimination: Goal: Will not experience complications related to bowel motility Outcome: Progressing

## 2024-01-21 NOTE — Progress Notes (Signed)
 NAME:  Phillip Young, MRN:  [id number], DOB:  27-Dec-1985, LOS: 18 ADMISSION DATE:  01/03/2024, CONSULTATION DATE:  01/03/24 REFERRING MD:  Dr. Patsey, CHIEF COMPLAINT:  SOB/ chills   History of Present Illness:  63 yoM w/PMH as below significant for biventricular HF (EF < 20%), chronic afib on Eliquis , chronic hypotension on midodrine  30mg  TID, ESRD, chronic hypoxic and hypercarbic respiratory failure on NIV, morbid obesity, medical non-compliance, and chronic pain presenting from home after being discharged from [facility] 6/9 with SOB, chills, subjective fever, ongoing poor PO, vomiting today strings of blood but then reported it was with coughing episodes, pain all over, and increased weeping from left thigh blister.  Had BM, unclear on consistency/ color.  Reports last dialysis yesterday.  Initially reports not taking any PO meds today but then said he did this morning.  Non- ambulatory at baseline.  Frequent admits, most recently 5/13-5/22 for recurrent septic shock and MRSE bacteremia due to suspected line complicated by acute on chronic HF and respiratory failure, suspected IE but not a candidate for TEE, discharged on vancomycin  with HD scheduled thru 6/30.  TDC was replaced 5/22 after 2 day line holiday.  Also started on micafungin  w/HD at [facility] after [facility] grew candida species, scheduled thru 6/30.   In ER, temp 99.1, on HFNC at 6L, with SBP into 70's requiring NE support after 500 ml bolus.  Labs significant for Na 129, K 6.3, Cl 89, alk phos 346, AST 84, t.bili 12 (recent 3.4 on 5/14), lactic 3.8, WBC 20.6 (previously 8.1 on 5/22), INR 2.9, CXR showing vascular congestion and increasing congestion and pleural effusions.  BC sent and empirically started on linezolid , flagyl , and cefepime .  Pending CT chest/abd/ pelvis.  Nephrology consulted.  PCCM called for admit.   Pertinent  Medical History   Past Medical History:  Diagnosis Date   Acute on chronic respiratory failure with hypoxia ([name])  04/21/2021   Acute on chronic systolic (congestive) heart failure ([name]) 02/26/2020   Amiodarone  toxicity    Anemia    Biventricular congestive heart failure ([name])    Candida parapsilosis infection 12/26/2023   Chronic hypoxemic respiratory failure ([name])    Class 3 severe obesity due to excess calories with serious comorbidity and body mass index (BMI) of 50.0 to 59.9 in adult 02/26/2020   ESRD on hemodialysis [facility])    Essential hypertension 02/26/2020   GERD without esophagitis 02/26/2020   Hidradenitis suppurativa 02/26/2020   NICM (nonischemic cardiomyopathy) ([name])    Obesity hypoventilation syndrome ([name])    OSA (obstructive sleep apnea)    Pneumonia    Prediabetes 02/26/2020   Significant Hospital Events: Including procedures, antibiotic start and stop dates in addition to other pertinent events   6/10 Admit 6/12 Remains on vasopressors.  Spoke to heart failure.  Not a candidate for advanced therapies.  Not a candidate for tailored hemodynamics.  Recommend a strategy of vasopressor support at fluid removal in an attempt to transition back to IHD. 6/15 patient brought up discussion of limiting some of his care; Palliative re-consulted.  6/16 palliative consulted per patient's request. CT unrevealing for cause of abdominal pain. 6/23 nephro decided to restart CRRT despite prior nephro stopping. Remains hypotensive, mother did not consent central line, PICC delayed, peripheral pressors and resume CRRT, developed ileus vs SBO, NG placed to [facility] 6/24 PICC could not be placed, mother refused central line again 6/25 central line placed 6/26 CRRT stopped  Interim History / Subjective:  Remains on NE but lower  dose Bps all over the place  Objective    Blood pressure (!) 148/105, pulse 85, temperature 98.2 F (36.8 C), temperature source Axillary, resp. rate 17, height 6' (1.829 m), weight (!) 164.3 kg, SpO2 94%.        Intake/Output Summary (Last 24 hours) at 01/21/2024 0753 Last  data filed at 01/21/2024 9278 Gross per 24 hour  Intake 650.77 ml  Output --  Net 650.77 ml   Filed Weights   01/19/24 0500 01/20/24 0600 01/21/24 0400  Weight: (!) 162.6 kg (!) 163 kg (!) 164.3 kg   Examination: Chronically ill appearing obese man, laying flat On nasal cannula Breath and lung sounds diminished RRR No wheeze Abdomen obese, soft Non pitting peripheral edema vs lymphedema Faint breathy and soft voice, very weak, unable to sit up independently, alert follows commands and conversant   Labs and images reviewed   Resolved problem list:    Assessment and Plan  Shock ([name])> suspect primarily cardiogenic Continue vasopressor support with Levophed , MAP goal 60 or SBP 90 Continue IV vancomycin  to treat cellulitis end 6/30 Not a candidate for advanced heart failure therapy Patient mother refused solucortef 6/23 for possible adrenal insufficiency  Central line placed 6/25, cap pressors at NE max dose 40 Continue midodrine    Hyperbilirubinemia-- POA and present 5/25 now worsened, congestive vs low forward flow state. 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 No signs of obstruction.  Acaculous cholecystitis is possible> no suggestion of this on any of his imaging. Patent hepatic portal veins so no Budd Chiari. Low suspicion for DILI with normal transaminases and no obvious culprit.  Continue trial of ursodiol , bili seems trending down mildly, remains over 20  Acute on chronic biventricular congestive heart failure ([name])-combined sysolic & diastolic; known NICM. Echo shows severe RV and LV failure with EF <20%. Non-compliant for many years. Per advanced heart failure, patient is not a candidate for advanced therapy Unable to tolerate GDMT in the setting of  hypotension CRRT was stopped, still requiring vasopressor support, would not tolerate IHD, CRRT resumed time limited trial 6/23 for 3 days to end AM 6/25 If he is unable to wean off pressors, hospice care will be the only reasonable option.  Right now I think it is highly unlikely he will be able to resume iHD with his current status. Palliative care reconsulted and now following peripherally.  ESRD on hemodialysis CRRT was stopped, discussed with nephro utility to resume, time limited trial again after discussion 6/23 and ended 6/26 Unable to tolerate HD due to high norepinephrine  needs As such unless hemodynamics improve there is no option for dialysis long term  Constipation SBO vs Ileus on plain film 6/23 Had BM 6/21. Continue to offer bowel regimen NG placed overnight to 6/24, LIWS, repeat plain film in slightly imrpoved on my review, rads read as unchanged Had BM overnight, trial clears  Obesity hypoventilation syndrome - Intermittently compliant with home BIPAP. Recommend at bedtime or when sleeping otherwise he use bipap; patient intermittently declining   Permanent atrial fibrillation  Amiodarone  intolerant Heart rate is controlled Bival stopped with bleeding wounds 6/24, resume 6/25 ad hgb stable and no further bleeding Due to hyperbilirubinemia, can not switch him to Eliquis   Digoxin  level to aid in re-dosing  Chronic pain disorder Continue pregabalin  Oxycodone  and Dilaudid   stopped considering low blood pressure  Mild hyperammonemia Lactulose  BID; he has been refusing  Skin ulceration; recent cellulitis  Has chronic skin ulceration, and he routinely has been refusing dressing changes. He understands he will not be able to make progress if we cannot keep his wounds clean. On examination: multiple areas of covered wound but no drainage or discoloration.  On Vanc- dosing per pharmacy - to end 6/30  Long term prognosis remains grim. No good options if he is not able to  tolerate iHD. Appreciate Palliative care team's management. Most likely outcome is in hospital death. GOC remain full code yet at times refuse appropriate and aggressive medical care as recommended. Not amenable currently to discussion about imminent death.    Best Practice (right click and Reselect all SmartList Selections daily)   Diet/type: Regular consistency (see orders) DVT prophylaxis SCD, bival gtt Pressure ulcer(s): present on admission  GI prophylaxis: PPI Lines: Dialysis Catheter-pta R TDC (12/15/23) Foley:  N/A Code Status:  full code Last date of multidisciplinary goals of care discussion - discussed with mom bedside 6/25, discussed with CMO Phillip Pina, MD 6/23.   CRITICAL CARE Performed by: Phillip Young   Total critical care time: 32 minutes  Critical care time was exclusive of separately billable procedures and treating other patients.  Critical care was necessary to treat or prevent imminent or life-threatening deterioration.  Critical care was time spent personally by me on the following activities: development of treatment plan with patient and/or surrogate as well as nursing, discussions with consultants, evaluation of patient's response to treatment, examination of patient, obtaining history from patient or surrogate, ordering and performing treatments and interventions, ordering and review of laboratory studies, ordering and review of radiographic studies, pulse oximetry and re-evaluation of patient's condition.      Phillip Young [place] [facility] 01/21/2024 7:53 AM  Pager: see AMION  If no response to pager , please call critical care on call (see AMION) until 7pm After 7:00 pm call Elink

## 2024-01-21 NOTE — Progress Notes (Signed)
  [facility]GRESS NOTE  Summary: Pt is a 38 y.o. yo male with past medical history of biventricular heart failure EF less than 20, chronic A-fib, ESRD on HD, chronic hypotension on high-dose midodrine , nonadherence with outpatient HD and treatment, recently at [facility] presented with shock and volume overload.  Subjective:   Seen in ICU bed Pt more alert, levo at 3- 6 micrograms/min  Objective Vital signs in last 24 hours: Vitals:   01/21/24 0930 01/21/24 0945 01/21/24 1000 01/21/24 1030  BP: (!) 137/107 (!) 152/130 107/82 (!) 86/75  Pulse: 93 (!) 104 95 96  Resp: 18 (!) 22 17 (!) 34  Temp:      TempSrc:      SpO2: 97% 98% 97% 97%  Weight:      Height:       Physical Exam: General:NAD, lying flat, NG tube in  Heart:RRR, s1s2 nl Lungs: Bilateral distant breath sound Abdomen:soft, mildly distended Extremities: 1+ bilat LE edema, bilat upper thighs w/ skin hardening/ blackening in serpentine pattern w/ some skin sloughing Dialysis Access: TDC  OP HD: MWF [facility] 4.5h   B400  180kg  TDC  NO heparin  (h/o HIT) Last OP HD 5/09, post wt 190kg  Assessment/ Plan # Shock, cardiogenic:  12/06/23 EF < 20%. Sepsis less likely with neg cultures, abx off. Not a candidate for advanced therapies  # ESRD:  on HD since 01/2021. [facility] MWF, noncompliant with outpatient HD with chronic hyperkalemia.  Was on CRRT 6/10- 6/19, then was dc'd. He remained too unstable for iHD. Discussed EOL issues 6/23 w/ pt's mother and we agreed for a 3 day trial of CRRT which ended yesterday. Plan is for iHD as tolerated, no further CRRT. Plan for iHD in ICU Sat.   # Anemia of ESRD: cont weekly darbe sq 150 mcg . Transfuse as needed.  # Chronic hypotension/volume: on midodrine  40mg  tid, also levo gtt 3-6 micrograms/min  # a fib: HR has been running in the 110s-130s  #HIT: avoiding heparin   #Elevated Tbili: with synthetic dysfunction  # Possible calciphylaxis: bilat anterior thighs w/ sig  areas of skin hardening + darkening w/ associated sloughing. Potentially this could be calciphylaxis. Avoid Ca/ vit D products. Started on IV sodium thiosulfate  tiw.    Myer Fret  MD  [facility] 01/21/2024, 11:00 AM  Recent Labs  Lab 01/19/24 1600 01/20/24 0415 01/20/24 0416 01/21/24 0352 01/21/24 0721  HGB  --  8.6*  --  8.2*  --   ALBUMIN  <1.5* <1.5* <1.5* <1.5*  --   CALCIUM  7.9*  --  8.7*  --  8.6*  PHOS 3.4  --  3.0  --   --   CREATININE 1.84*  --  2.32*  --  3.34*  K 3.7  --  4.1  --  4.3    Inpatient medications:  Chlorhexidine  Gluconate Cloth  6 each Topical Daily   Chlorhexidine  Gluconate Cloth  6 each Topical Q0600   Darbepoetin Alfa   150 mcg Subcutaneous Q7 days   feeding supplement (NEPRO CARB STEADY)  237 mL Oral BID BM   leptospermum manuka honey  1 Application Topical Daily   liver oil-zinc  oxide   Topical BID   midodrine   40 mg Oral TID WC   mouth rinse  15 mL Mouth Rinse 4 times per day   pantoprazole  (PROTONIX ) IV  40 mg Intravenous Q12H   ursodiol   300 mg Oral BID   vancomycin  variable dose per unstable renal function (pharmacist dosing)  Does not apply See admin instructions    anticoagulant sodium citrate      bivalirudin  (ANGIOMAX ) 250 mg in sodium chloride  0.9 % 500 mL (0.5 mg/mL) infusion 0.016 mg/kg/hr (01/21/24 1032)   norepinephrine  (LEVOPHED ) Adult infusion 3 mcg/min (01/21/24 1032)   sodium thiosulfate  25 g in sodium chloride  0.9 % 200 mL Infusion for Calciphylaxis Stopped (01/19/24 1820)   vancomycin  Stopped (01/21/24 1002)   anticoagulant sodium citrate , camphor-menthol , HYDROmorphone  (DILAUDID ) injection, ondansetron  (ZOFRAN ) IV, mouth rinse

## 2024-01-22 DIAGNOSIS — R579 Shock, unspecified: Secondary | ICD-10-CM | POA: Diagnosis not present

## 2024-01-22 DIAGNOSIS — Z992 Dependence on renal dialysis: Secondary | ICD-10-CM | POA: Diagnosis not present

## 2024-01-22 DIAGNOSIS — N186 End stage renal disease: Secondary | ICD-10-CM | POA: Diagnosis not present

## 2024-01-22 LAB — HEPATIC FUNCTION PANEL
ALT: 40 U/L (ref 0–44)
AST: 54 U/L — ABNORMAL HIGH (ref 15–41)
Albumin: <1.5 g/dL — ABNORMAL LOW (ref 3.5–5.0)
Alkaline Phosphatase: 242 U/L — ABNORMAL HIGH (ref 38–126)
Bilirubin, Direct: 13.4 mg/dL — ABNORMAL HIGH (ref 0.0–0.2)
Indirect Bilirubin: 6.4 mg/dL — ABNORMAL HIGH (ref 0.3–0.9)
Total Bilirubin: 19.8 mg/dL (ref 0.0–1.2)
Total Protein: 5.4 g/dL — ABNORMAL LOW (ref 6.5–8.1)

## 2024-01-22 LAB — CBC
HCT: 24.3 % — ABNORMAL LOW (ref 39.0–52.0)
Hemoglobin: 8.5 g/dL — ABNORMAL LOW (ref 13.0–17.0)
MCH: 30.2 pg (ref 26.0–34.0)
MCHC: 35 g/dL (ref 30.0–36.0)
MCV: 86.5 fL (ref 80.0–100.0)
Platelets: 87 10*3/uL — ABNORMAL LOW (ref 150–400)
RBC: 2.81 MIL/uL — ABNORMAL LOW (ref 4.22–5.81)
RDW: 23 % — ABNORMAL HIGH (ref 11.5–15.5)
WBC: 47.4 10*3/uL — ABNORMAL HIGH (ref 4.0–10.5)
nRBC: 0.1 % (ref 0.0–0.2)

## 2024-01-22 LAB — APTT
aPTT: 107 s — ABNORMAL HIGH (ref 24–36)
aPTT: 93 s — ABNORMAL HIGH (ref 24–36)
aPTT: 85 s — ABNORMAL HIGH (ref 24–36)
aPTT: 98 s — ABNORMAL HIGH (ref 24–36)

## 2024-01-22 LAB — BASIC METABOLIC PANEL WITH GFR
Anion gap: 23 — ABNORMAL HIGH (ref 5–15)
BUN: 39 mg/dL — ABNORMAL HIGH (ref 6–20)
CO2: 17 mmol/L — ABNORMAL LOW (ref 22–32)
Calcium: 8.6 mg/dL — ABNORMAL LOW (ref 8.9–10.3)
Chloride: 93 mmol/L — ABNORMAL LOW (ref 98–111)
Creatinine, Ser: 2.94 mg/dL — ABNORMAL HIGH (ref 0.61–1.24)
GFR, Estimated: 27 mL/min — ABNORMAL LOW (ref >60)
Glucose, Bld: 101 mg/dL — ABNORMAL HIGH (ref 70–99)
Potassium: 4.5 mmol/L (ref 3.5–5.1)
Sodium: 133 mmol/L — ABNORMAL LOW (ref 135–145)

## 2024-01-22 LAB — PROTIME-INR
INR: 3.7 — ABNORMAL HIGH (ref 0.8–1.2)
Prothrombin Time: 38.3 s — ABNORMAL HIGH (ref 11.4–15.2)

## 2024-01-22 MED ORDER — LIDOCAINE HCL (PF) 1 % IJ SOLN: 5.0000 mL | INTRAMUSCULAR | Status: DC | PRN

## 2024-01-22 MED ORDER — ALTEPLASE 2 MG IJ SOLR: 2.0000 mg | Freq: Once | INTRAMUSCULAR | Status: DC | PRN

## 2024-01-22 MED ORDER — PENTAFLUOROPROP-TETRAFLUOROETH EX AERO: 1.0000 | INHALATION_SPRAY | CUTANEOUS | Status: DC | PRN

## 2024-01-22 MED ORDER — ANTICOAGULANT SODIUM CITRATE 4% (200MG/5ML) IV SOLN
5.0000 mL | Freq: Once | Status: AC
  Administered 2024-01-25: 5 mL
  Filled 2024-01-22 (×2): qty 5

## 2024-01-22 MED ORDER — LIDOCAINE-PRILOCAINE 2.5-2.5 % EX CREA
1.0000 | TOPICAL_CREAM | CUTANEOUS | Status: DC | PRN
Start: 2024-01-22 — End: 2024-01-28

## 2024-01-22 MED ORDER — NEPRO/CARBSTEADY PO LIQD
237.0000 mL | ORAL | Status: DC | PRN
Start: 2024-01-22 — End: 2024-01-23

## 2024-01-22 MED ORDER — ANTICOAGULANT SODIUM CITRATE 4% (200MG/5ML) IV SOLN
5.0000 mL | Status: DC | PRN
  Administered 2024-01-22: 5 mL
  Filled 2024-01-22: qty 5

## 2024-01-22 MED ORDER — DIGOXIN 125 MCG PO TABS
0.1250 mg | ORAL_TABLET | Freq: Once | ORAL | Status: AC
  Administered 2024-01-22: 0.125 mg via ORAL
  Filled 2024-01-22: qty 1

## 2024-01-22 MED ORDER — HYDROMORPHONE HCL 1 MG/ML IJ SOLN
1.0000 mg | INTRAMUSCULAR | Status: DC | PRN
  Administered 2024-01-22 – 2024-01-24 (×9): 1 mg via INTRAVENOUS
  Filled 2024-01-22 (×9): qty 1

## 2024-01-22 NOTE — Progress Notes (Signed)
 Wound care to all wounds completed per orders. Patient tolerated well. Patient's [name], at bedside who was very cooperative and appreciative.
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
## 2024-01-22 NOTE — Progress Notes (Signed)
 PHARMACY - ANTICOAGULATION  Pharmacy Consult for bivalirudin    Indication: atrial fibrillation w/ hx of HIT  Brief A/P: aPTT within goal range No change to bivalirudin   Patient Measurements: Height: 6' (182.9 cm) Weight: (!) 164.3 kg (362 lb 3.5 oz) IBW/kg (Calculated) : 77.6 HEPARIN  DW (KG): 125.9  Vital Signs: Temp: 97.3 F (36.3 C) (06/29 2000) Temp Source: Axillary (06/29 2000) BP: 95/58 (06/29 2245) Pulse Rate: 76 (06/29 2245)  Labs: Recent Labs    01/20/24 0415 01/20/24 0416 01/20/24 1400 01/21/24 0352 01/21/24 0721 01/21/24 1353 01/22/24 0418 01/22/24 0959 01/22/24 1456 01/22/24 1457 01/22/24 2237  HGB 8.6*  --   --  8.2*  --   --   --   --   --  8.5*  --   HCT 25.8*  --   --  23.7*  --   --   --   --   --  24.3*  --   PLT 97*  --   --  91*  --   --   --   --   --  87*  --   APTT 66*  --    < > 101*  --    < >  --  93*  --  85* 98*  LABPROT  --   --   --   --   --   --  38.3*  --   --   --   --   INR  --   --   --   --   --   --  3.7*  --   --   --   --   CREATININE  --  2.32*  --   --  3.34*  --   --   --  2.94*  --   --    < > = values in this interval not displayed.    Estimated Creatinine Clearance: 54.1 mL/min (A) (by C-G formula based on SCr of 2.94 mg/dL (H)).  Assessment: 38 y.o. male with h/o Afib, Eliquis  on hold for CRRT, for bivalirudin  due to h/o HIT.   Goal of Therapy:  aPTT: 80-100 sec Monitor platelets by anticoagulation protocol: Yes   Plan:  Continue bivalrudin at current rate  Cathlyn Arrant, PharmD, BCPS 01/22/2024  11:09 PM

## 2024-01-22 NOTE — Progress Notes (Signed)
  Alert and oriented.  Informed consent signed and in chart.   TX duration:3hours  Patient tolerated well.  Alert, without acute distress.  Hand-off given to patient's nurse.  Access used: catheter Access issues: none  Total UF removed: 0 ml Medication(s) given: none Post HD VS: 131/111 Post HD weight: unable to obtain.   01/22/24 0645  Vitals  BP (!) 131/111  MAP (mmHg) 118  BP Location Right Arm  BP Method Automatic  Patient Position (if appropriate) Lying  Pulse Rate (!) 112  Pulse Rate Source Monitor  ECG Heart Rate (!) 126  Resp (!) 23  Oxygen  Therapy  SpO2 97 %  O2 Device Nasal Cannula  O2 Flow Rate (L/min) 3 L/min  Patient Activity (if Appropriate) In bed  Pulse Oximetry Type Continuous  During Treatment Monitoring  Blood Flow Rate (mL/min) 0 mL/min  Arterial Pressure (mmHg) 1.01 mmHg  Venous Pressure (mmHg) -3.03 mmHg  TMP (mmHg) 15.15 mmHg  Ultrafiltration Rate (mL/min) 165 mL/min  Dialysate Flow Rate (mL/min) 299 ml/min  Duration of HD Treatment -hour(s) 3 hour(s)  Cumulative Fluid Removed (mL) per Treatment  -99.95  Post Treatment  Dialyzer Clearance Lightly streaked  Hemodialysis Intake (mL) 0 mL  Liters Processed 48.4  Fluid Removed (mL) 0 mL  Tolerated HD Treatment Yes  Post-Hemodialysis Comments HD tx achieved as expected, tolerated well, UF even for this treatment. The blood flow rate decreased to 250-300 due to heart rate elevated.  pt is stable.  AVG/AVF Arterial Site Held (minutes) 0 minutes  AVG/AVF Venous Site Held (minutes) 0 minutes  Note  Patient Observations pt is in bed resting.  Hemodialysis Catheter Right Internal jugular  Placement Date/Time: 12/15/23 0936   Serial / Lot #: [id number]  Expiration Date: 04/27/28  Time Out: Correct patient;Correct site;Correct procedure  Maximum sterile barrier precautions: Hand hygiene;Cap;Mask;Sterile gown;Sterile gloves;Large sterile ...  Site Condition No complications  Blue Lumen Status Flushed   Red Lumen Status Infusing  Purple Lumen Status N/A  Catheter fill solution 4% Sodium Citrate   Catheter fill volume (Arterial) 2.1 cc  Catheter fill volume (Venous) 2.1  Dressing Type Transparent  Dressing Status Antimicrobial disc/dressing in place  Drainage Description None  Post treatment catheter status Capped and Clamped      [facility]

## 2024-01-22 NOTE — Progress Notes (Signed)
 Wound care done per order on all wounds after bath.
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
## 2024-01-22 NOTE — Progress Notes (Signed)
 PHARMACY - ANTICOAGULATION  Pharmacy Consult for bivalirudin    Indication: atrial fibrillation w/ hx of HIT   Patient Measurements: Height: 6' (182.9 cm) Weight: (!) 164.3 kg (362 lb 3.5 oz) IBW/kg (Calculated) : 77.6 HEPARIN  DW (KG): 125.9  Vital Signs: Temp: 98.1 F (36.7 C) (06/29 0356) Temp Source: Axillary (06/29 0356) BP: 156/141 (06/29 0515) Pulse Rate: 96 (06/29 0515)  Labs: Recent Labs    01/19/24 1600 01/20/24 0415 01/20/24 0416 01/20/24 1400 01/21/24 0352 01/21/24 0721 01/21/24 1353 01/21/24 2000 01/22/24 0400 01/22/24 0418  HGB  --  8.6*  --   --  8.2*  --   --   --   --   --   HCT  --  25.8*  --   --  23.7*  --   --   --   --   --   PLT  --  97*  --   --  91*  --   --   --   --   --   APTT  --  66*  --    < > 101*  --  108* 109* 107*  --   LABPROT  --   --   --   --   --   --   --   --   --  38.3*  INR  --   --   --   --   --   --   --   --   --  3.7*  CREATININE 1.84*  --  2.32*  --   --  3.34*  --   --   --   --    < > = values in this interval not displayed.    Estimated Creatinine Clearance: 47.6 mL/min (A) (by C-G formula based on SCr of 3.34 mg/dL (H)).  Assessment: 38 y.o. male with h/o Afib, Eliquis  on hold for CRRT, for argatroban  due to h/o HIT. Remains on CRRT. HgB 9.8 and PLTs 281, aPTT downtrending but remains elevated at 61. No baseline aPTT from this admission, T-bili continues to increase to 20.7, noted yellowing of sclera on exam. Argatroban  off since 6/13 around noon. Unclear if aPTT elevated due to liver dysfunction/congestion, underlying coagulopathy, or due to argatroban . Half life of argatroban  even with liver dysfunction approximately 190 minutes so would anticipate this to be out of patient's system given it has been off since 6/13. T-bili during last admission around 3 with no baseline aPTT reading. INR upon admission 11/2023 was 3.5 INR this admission was 2.9 but noted on Eliquis  PTA. Patient's baseline suspected to be around 55. Due to  liver impairment will transition to bival while on CRRT. In addition to Afib, patient high risk for PE with weight, low EF, and minimal movement.   Bivalrudin has been off since 6/23 @ 20:00. Half life bival while on dialysis approximately 3.5 hours. Plan was to resume 6/26 however patient had some bloody emesis and continued to hold.   aPTT 101 sec (slightly supratherapeutic) on infusion at 0.018 mg/kg/min. No further bleeding noted. Noted liver dysfunction remains stable - will f/u with CCM about conformability of utilizing DOAC.   6/28 PM - aPTT 109 sec increased from last check despite rate decrease to 0.016 mg/kg/min.    6/29 AM: aPTT remains supra-therapeutic   Goal of Therapy:  aPTT: 80-100, 1.5x baseline of approx 55-60, lower end range w/ recent bleeding  Monitor platelets by anticoagulation protocol: Yes   Plan:  Decrease bivalrudin  to 0.008 mg/kg/min utilizing adjusted body weight Recheck aPTT in 4 hours   Lynwood Mckusick, PharmD, BCPS Clinical Pharmacist Phone: [phone]

## 2024-01-22 NOTE — Progress Notes (Addendum)
 NAME:  Phillip Young, MRN:  [id number], DOB:  10-30-1985, LOS: 19 ADMISSION DATE:  01/03/2024, CONSULTATION DATE:  01/03/24 REFERRING MD:  Dr. Patsey, CHIEF COMPLAINT:  SOB/ chills   History of Present Illness:  89 yoM w/PMH as below significant for biventricular HF (EF < 20%), chronic afib on Eliquis , chronic hypotension on midodrine  30mg  TID, ESRD, chronic hypoxic and hypercarbic respiratory failure on NIV, morbid obesity, medical non-compliance, and chronic pain presenting from home after being discharged from [facility] 6/9 with SOB, chills, subjective fever, ongoing poor PO, vomiting today strings of blood but then reported it was with coughing episodes, pain all over, and increased weeping from left thigh blister.  Had BM, unclear on consistency/ color.  Reports last dialysis yesterday.  Initially reports not taking any PO meds today but then said he did this morning.  Non- ambulatory at baseline.  Frequent admits, most recently 5/13-5/22 for recurrent septic shock and MRSE bacteremia due to suspected line complicated by acute on chronic HF and respiratory failure, suspected IE but not a candidate for TEE, discharged on vancomycin  with HD scheduled thru 6/30.  TDC was replaced 5/22 after 2 day line holiday.  Also started on micafungin  w/HD at [facility] after [facility] grew candida species, scheduled thru 6/30.   In ER, temp 99.1, on HFNC at 6L, with SBP into 70's requiring NE support after 500 ml bolus.  Labs significant for Na 129, K 6.3, Cl 89, alk phos 346, AST 84, t.bili 12 (recent 3.4 on 5/14), lactic 3.8, WBC 20.6 (previously 8.1 on 5/22), INR 2.9, CXR showing vascular congestion and increasing congestion and pleural effusions.  BC sent and empirically started on linezolid , flagyl , and cefepime .  Pending CT chest/abd/ pelvis.  Nephrology consulted.  PCCM called for admit.   Pertinent  Medical History   Past Medical History:  Diagnosis Date   Acute on chronic respiratory failure with hypoxia ([name])  04/21/2021   Acute on chronic systolic (congestive) heart failure ([name]) 02/26/2020   Amiodarone  toxicity    Anemia    Biventricular congestive heart failure ([name])    Candida parapsilosis infection 12/26/2023   Chronic hypoxemic respiratory failure ([name])    Class 3 severe obesity due to excess calories with serious comorbidity and body mass index (BMI) of 50.0 to 59.9 in adult 02/26/2020   ESRD on hemodialysis [facility])    Essential hypertension 02/26/2020   GERD without esophagitis 02/26/2020   Hidradenitis suppurativa 02/26/2020   NICM (nonischemic cardiomyopathy) ([name])    Obesity hypoventilation syndrome ([name])    OSA (obstructive sleep apnea)    Pneumonia    Prediabetes 02/26/2020   Significant Hospital Events: Including procedures, antibiotic start and stop dates in addition to other pertinent events   6/10 Admit 6/12 Remains on vasopressors.  Spoke to heart failure.  Not a candidate for advanced therapies.  Not a candidate for tailored hemodynamics.  Recommend a strategy of vasopressor support at fluid removal in an attempt to transition back to IHD. 6/15 patient brought up discussion of limiting some of his care; Palliative re-consulted.  6/16 palliative consulted per patient's request. CT unrevealing for cause of abdominal pain. 6/23 nephro decided to restart CRRT despite prior nephro stopping. Remains hypotensive, mother did not consent central line, PICC delayed, peripheral pressors and resume CRRT, developed ileus vs SBO, NG placed to [facility] 6/24 PICC could not be placed, mother refused central line again 6/25 central line placed 6/26 CRRT stopped  Interim History / Subjective:  Remains on NE now on  higher doses, remains alert. Complains of pain. Dilaudid  dose increased. Mother was rude and mocked this physician in room while discussing care.   Objective    Blood pressure 104/69, pulse (!) 125, temperature 97.8 F (36.6 C), temperature source Axillary, resp. rate (!) 22, height  6' (1.829 m), weight (!) 164.3 kg, SpO2 (!) 85%.        Intake/Output Summary (Last 24 hours) at 01/22/2024 0956 Last data filed at 01/22/2024 0900 Gross per 24 hour  Intake 1294.08 ml  Output 0 ml  Net 1294.08 ml   Filed Weights   01/19/24 0500 01/20/24 0600 01/21/24 0400  Weight: (!) 162.6 kg (!) 163 kg (!) 164.3 kg   Examination: Chronically ill appearing obese man, laying flat On nasal cannula Breath and lung sounds diminished RRR No wheeze Abdomen obese, soft Non pitting peripheral edema vs lymphedema Faint breathy and soft voice, very weak, unable to sit up independently, alert follows commands and conversant   Labs and images reviewed   Resolved problem list:    Assessment and Plan  Shock ([name])> suspect primarily cardiogenic Continue vasopressor support with Levophed , MAP goal 60 or SBP 90 Continue IV vancomycin  to treat cellulitis end 6/30 Not a candidate for advanced heart failure therapy Patient mother refused solucortef 6/23 for possible adrenal insufficiency  Central line placed 6/25, cap pressors at NE max dose 40 Continue midodrine    Leukocytosis: Rising WBC, pressors up and down, no fever, unable to remove central line with ongoing pressor need and possible iHD.  --CTM -- Receiving course of vanc -- cross sectional imaging w/o contrast ordered, consider blood cultures if continues to rise and/or imaging negative  Hyperbilirubinemia-- POA and present 5/25 now worsened, congestive vs low forward flow state. 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 No signs of obstruction.  Acaculous cholecystitis is possible> no suggestion of this on any of his imaging. Patent hepatic portal veins so no Budd Chiari. Low suspicion for DILI with normal  transaminases and no obvious culprit.  Continue trial of ursodiol , bili seems trending down mildly, stable ~20  Acute on chronic biventricular congestive heart failure ([name])-combined sysolic & diastolic; known NICM. Echo shows severe RV and LV failure with EF <20%. Non-compliant for many years. Per advanced heart failure, patient is not a candidate for advanced therapy Unable to tolerate GDMT in the setting of hypotension CRRT was stopped, still requiring vasopressor support, would not tolerate IHD, CRRT resumed time limited trial 6/23 for 3 days to end AM 6/25 If he is unable to wean off pressors, hospice care will be the only reasonable option.  Right now I think it is highly unlikely he will be able to resume iHD with his current status. Palliative care reconsulted and now following peripherally.  ESRD on hemodialysis CRRT was stopped, discussed with nephro utility to resume, time limited trial again after discussion 6/23 and ended 6/26 Unable to tolerate HD due to high norepinephrine  needs As such unless hemodynamics improve there is no option for dialysis long term  Constipation SBO vs Ileus on plain film 6/23 (resolved) Had BM 6/21. Continue to offer bowel regimen  Obesity hypoventilation syndrome - Intermittently compliant with home BIPAP. Recommend at bedtime or when sleeping otherwise he use bipap; patient intermittently declining   Permanent atrial fibrillation  Amiodarone  intolerant Heart rate is  controlled Bival stopped with bleeding wounds 6/24, resume 6/25 ad hgb stable and no further bleeding Due to hyperbilirubinemia, can not switch him to Eliquis   Digoxin  repeat dose 6/29 - will hold addl doses as unlikely will tolerate iHD  Chronic pain disorder Continue pregabalin  Dilaudid  PRN  Mild hyperammonemia Lactulose  BID; he has been refusing  Skin ulceration; recent cellulitis  Has chronic skin ulceration, and he routinely has been refusing dressing changes. He  understands he will not be able to make progress if we cannot keep his wounds clean. On examination: multiple areas of covered wound but no drainage or discoloration.  On Vanc- dosing per pharmacy - to end 6/30  Long term prognosis remains grim. No good options if he is not able to tolerate iHD. Appreciate Palliative care team's management. Most likely outcome is in hospital death. GOC remain full code yet at times refuse appropriate and aggressive medical care as recommended. Not amenable currently to discussion about imminent death.    Best Practice (right click and Reselect all SmartList Selections daily)   Diet/type: Regular consistency (see orders) DVT prophylaxis SCD, bival gtt Pressure ulcer(s): present on admission  GI prophylaxis: PPI Lines: Dialysis Catheter-pta R TDC (12/15/23) Foley:  N/A Code Status:  full code Last date of multidisciplinary goals of care discussion - discussed with mom bedside 6/29, discussed with CMO Gordy Pina, MD 6/23.   CRITICAL CARE Performed by: Donnice SAUNDERS Maisyn Nouri   Total critical care time: 31 minutes  Critical care time was exclusive of separately billable procedures and treating other patients.  Critical care was necessary to treat or prevent imminent or life-threatening deterioration.  Critical care was time spent personally by me on the following activities: development of treatment plan with patient and/or surrogate as well as nursing, discussions with consultants, evaluation of patient's response to treatment, examination of patient, obtaining history from patient or surrogate, ordering and performing treatments and interventions, ordering and review of laboratory studies, ordering and review of radiographic studies, pulse oximetry and re-evaluation of patient's condition.      Donnice SAUNDERS Beals MD [facility] 01/22/2024 9:56 AM  Pager: see AMION  If no response to pager , please call critical care on  call (see AMION) until 7pm After 7:00 pm call Elink

## 2024-01-22 NOTE — Progress Notes (Signed)
 [facility] NEPHROLOGY PROGRESS NOTE  Summary: Pt is a 38 y.o. yo male with past medical history of biventricular heart failure EF less than 20, chronic A-fib, ESRD on HD, chronic hypotension on high-dose midodrine , nonadherence with outpatient HD and treatment, recently at [facility] presented with shock and volume overload.  Subjective:   Seen in ICU bed Was off pressors but overnight was restarted at 20-25 micrograms/min range  Objective Vital signs in last 24 hours: Vitals:   01/22/24 1615 01/22/24 1630 01/22/24 1645 01/22/24 1700  BP: 136/64 (!) 111/56 99/66 111/75  Pulse: 95 (!) 115 (!) 110 (!) 107  Resp: (!) 23 (!) 21 19 19   Temp:      TempSrc:      SpO2: 99% (!) 88% 96% 92%  Weight:      Height:       Physical Exam: General:NAD, head up 40 deg, interacts minimally, tired Heart:RRR, s1s2 nl Lungs: Bilateral distant breath sound Abdomen:soft, mildly distended Extremities: 1+ bilat LE edema, bilat upper thighs w/ skin hardening/ blackening in serpentine pattern w/ some skin sloughing Dialysis Access: TDC  OP HD: MWF [facility] 4.5h   B400  180kg  TDC  NO heparin  (h/o HIT) Last OP HD 5/09, post wt 190kg  Assessment/ Plan # Shock, cardiogenic:  12/06/23 EF < 20%. Sepsis less likely with neg cultures, abx off. Not a candidate for advanced therapies  # ESRD:  on HD since 01/2021. Usual HD at [facility]. Noncompliant with outpatient HD with chronic hyperkalemia.  Was on CRRT 6/10- 6/19, then was dc'd. He remained too unstable for iHD. Discussed EOL issues 6/23 w/ pt's mother and she agreed for a 3 day trial of CRRT which ended 6/26. Plan was for iHD as tolerated, no further CRRT. Tolerated iHD in ICU Sat night overnight w/ not removing any fluid. Next HD Tuesday. Keep on TTS for now. Prognosis remains guarded.   # Anemia of ESRD: cont weekly darbe sq 150 mcg . Transfuse as needed.  # Chronic hypotension/volume: on midodrine  40mg  tid, levo gtt resumed overnight last night.   #  a fib: HR has been running in the 110s-130s  #HIT: avoiding heparin   #Elevated Tbili: with synthetic dysfunction  # Possible calciphylaxis: bilat anterior thighs w/ sig areas of skin hardening + darkening w/ associated sloughing. Potentially this is calciphylaxis. Avoiding Ca/ vit D products. Started on IV sodium thiosulfate  tiw.    Myer Fret  MD  [facility] 01/22/2024, 5:50 PM  Recent Labs  Lab 01/19/24 1600 01/20/24 0415 01/20/24 0416 01/21/24 0352 01/21/24 0721 01/22/24 0418 01/22/24 1456 01/22/24 1457  HGB  --    < >  --  8.2*  --   --   --  8.5*  ALBUMIN  <1.5*   < > <1.5* <1.5*  --  <1.5*  --   --   CALCIUM  7.9*  --  8.7*  --  8.6*  --  8.6*  --   PHOS 3.4  --  3.0  --   --   --   --   --   CREATININE 1.84*  --  2.32*  --  3.34*  --  2.94*  --   K 3.7  --  4.1  --  4.3  --  4.5  --    < > = values in this interval not displayed.    Inpatient medications:  Chlorhexidine  Gluconate Cloth  6 each Topical Daily   Chlorhexidine  Gluconate Cloth  6 each Topical Q0600  Darbepoetin Alfa   150 mcg Subcutaneous Q7 days   feeding supplement (NEPRO CARB STEADY)  237 mL Oral BID BM   leptospermum manuka honey  1 Application Topical Daily   liver oil-zinc  oxide   Topical BID   midodrine   40 mg Oral TID WC   mouth rinse  15 mL Mouth Rinse 4 times per day   pantoprazole  (PROTONIX ) IV  40 mg Intravenous Q12H   ursodiol   300 mg Oral BID   vancomycin  variable dose per unstable renal function (pharmacist dosing)   Does not apply See admin instructions    anticoagulant sodium citrate      anticoagulant sodium citrate      anticoagulant sodium citrate      bivalirudin  (ANGIOMAX ) 250 mg in sodium chloride  0.9 % 500 mL (0.5 mg/mL) infusion 0.008 mg/kg/hr (01/22/24 1700)   norepinephrine  (LEVOPHED ) Adult infusion 22 mcg/min (01/22/24 1700)   sodium thiosulfate  25 g in sodium chloride  0.9 % 200 mL Infusion for Calciphylaxis 25 g (01/22/24 0537)   alteplase , anticoagulant sodium citrate ,  anticoagulant sodium citrate , camphor-menthol , feeding supplement (NEPRO CARB STEADY), HYDROmorphone  (DILAUDID ) injection, lidocaine  (PF), lidocaine -prilocaine , ondansetron  (ZOFRAN ) IV, mouth rinse, pentafluoroprop-tetrafluoroeth

## 2024-01-22 NOTE — Plan of Care (Signed)
  Problem: Education: Goal: Knowledge of General Education information will improve Description: Including pain rating scale, medication(s)/side effects and non-pharmacologic comfort measures Outcome: Not Progressing   Problem: Health Behavior/Discharge Planning: Goal: Ability to manage health-related needs will improve Outcome: Not Progressing   Problem: Clinical Measurements: Goal: Diagnostic test results will improve Outcome: Not Progressing Goal: Respiratory complications will improve Outcome: Progressing Goal: Cardiovascular complication will be avoided Outcome: Progressing   Problem: Clinical Measurements: Goal: Respiratory complications will improve Outcome: Progressing   Problem: Clinical Measurements: Goal: Cardiovascular complication will be avoided Outcome: Progressing   Problem: Coping: Goal: Level of anxiety will decrease Outcome: Not Progressing   Problem: Pain Managment: Goal: General experience of comfort will improve and/or be controlled Outcome: Not Progressing

## 2024-01-22 NOTE — Progress Notes (Addendum)
 PHARMACY - ANTICOAGULATION  Pharmacy Consult for bivalirudin    Indication: atrial fibrillation w/ hx of HIT   Patient Measurements: Height: 6' (182.9 cm) Weight: (!) 164.3 kg (362 lb 3.5 oz) IBW/kg (Calculated) : 77.6 HEPARIN  DW (KG): 125.9  Vital Signs: Temp: 97.8 F (36.6 C) (06/29 0819) Temp Source: Axillary (06/29 0819) BP: 98/51 (06/29 1100) Pulse Rate: 98 (06/29 1100)  Labs: Recent Labs    01/19/24 1600 01/20/24 0415 01/20/24 0416 01/20/24 1400 01/21/24 0352 01/21/24 0721 01/21/24 1353 01/21/24 2000 01/22/24 0400 01/22/24 0418 01/22/24 0959  HGB  --  8.6*  --   --  8.2*  --   --   --   --   --   --   HCT  --  25.8*  --   --  23.7*  --   --   --   --   --   --   PLT  --  97*  --   --  91*  --   --   --   --   --   --   APTT  --  66*  --    < > 101*  --    < > 109* 107*  --  93*  LABPROT  --   --   --   --   --   --   --   --   --  38.3*  --   INR  --   --   --   --   --   --   --   --   --  3.7*  --   CREATININE 1.84*  --  2.32*  --   --  3.34*  --   --   --   --   --    < > = values in this interval not displayed.    Estimated Creatinine Clearance: 47.6 mL/min (A) (by C-G formula based on SCr of 3.34 mg/dL (H)).  Assessment: 38 y.o. male with h/o Afib, Eliquis  on hold for CRRT, for argatroban  due to h/o HIT. Remains on CRRT. HgB 9.8 and PLTs 281, aPTT downtrending but remains elevated at 61. No baseline aPTT from this admission, T-bili continues to increase to 20.7, noted yellowing of sclera on exam. Argatroban  off since 6/13 around noon. Unclear if aPTT elevated due to liver dysfunction/congestion, underlying coagulopathy, or due to argatroban . Half life of argatroban  even with liver dysfunction approximately 190 minutes so would anticipate this to be out of patient's system given it has been off since 6/13. T-bili during last admission around 3 with no baseline aPTT reading. INR upon admission 11/2023 was 3.5 INR this admission was 2.9 but noted on Eliquis  PTA.  Patient's baseline suspected to be around 55. Due to liver impairment will transition to bival while on CRRT. In addition to Afib, patient high risk for PE with weight, low EF, and minimal movement.   Bivalrudin has been off since 6/23 @ 20:00. Half life bival while on dialysis approximately 3.5 hours. Plan was to resume 6/26 however patient had some bloody emesis and continued to hold. Resumed 6/27, aPTT has been variable and required numerous dose decreases over the weekend.   aPTT 93 sec therapeutic on infusion at 0.008 mg/kg/min. No further bleeding noted. Noted liver dysfunction remains stable. INR today elevated 3.7 (note that bival can increase INR potentially around 0.5 from baseline), 6/28 HgB 8.2 and PLTs 91. Noted patient underwent HD this morning.    Goal  of Therapy:  aPTT: 80-100, 1.5x baseline of approx 55-60, lower end range w/ recent bleeding  Monitor platelets by anticoagulation protocol: Yes   Plan:  Continue bivalrudin to 0.008 mg/kg/min utilizing adjusted body weight Recheck aPTT in 4 hours Will get CBC with next aPTT check.   Powell Blush, PharmD, BCCCP  Clinical Pharmacist 01/22/2024  11:45 AM

## 2024-01-22 NOTE — Plan of Care (Signed)
  Problem: Clinical Measurements: Goal: Cardiovascular complication will be avoided Outcome: Progressing   Problem: Clinical Measurements: Goal: Ability to maintain clinical measurements within normal limits will improve Outcome: Not Progressing Goal: Will remain free from infection Outcome: Not Progressing Goal: Diagnostic test results will improve Outcome: Not Progressing

## 2024-01-22 NOTE — Progress Notes (Signed)
 PHARMACY - ANTICOAGULATION  Pharmacy Consult for bivalirudin    Indication: atrial fibrillation w/ hx of HIT   Patient Measurements: Height: 6' (182.9 cm) Weight: (!) 164.3 kg (362 lb 3.5 oz) IBW/kg (Calculated) : 77.6 HEPARIN  DW (KG): 125.9  Vital Signs: Temp: 96.9 F (36.1 C) (06/29 1552) Temp Source: Axillary (06/29 1552) BP: 92/54 (06/29 1600) Pulse Rate: 97 (06/29 1600)  Labs: Recent Labs    01/20/24 0415 01/20/24 0416 01/20/24 1400 01/21/24 0352 01/21/24 0721 01/21/24 1353 01/22/24 0400 01/22/24 0418 01/22/24 0959 01/22/24 1457  HGB 8.6*  --   --  8.2*  --   --   --   --   --  8.5*  HCT 25.8*  --   --  23.7*  --   --   --   --   --  24.3*  PLT 97*  --   --  91*  --   --   --   --   --  87*  APTT 66*  --    < > 101*  --    < > 107*  --  93* 85*  LABPROT  --   --   --   --   --   --   --  38.3*  --   --   INR  --   --   --   --   --   --   --  3.7*  --   --   CREATININE  --  2.32*  --   --  3.34*  --   --   --   --   --    < > = values in this interval not displayed.    Estimated Creatinine Clearance: 47.6 mL/min (A) (by C-G formula based on SCr of 3.34 mg/dL (H)).  Assessment: 38 y.o. male with h/o Afib, Eliquis  on hold for CRRT, for argatroban  due to h/o HIT. Remains on CRRT. HgB 9.8 and PLTs 281, aPTT downtrending but remains elevated at 61. No baseline aPTT from this admission, T-bili continues to increase to 20.7, noted yellowing of sclera on exam. Argatroban  off since 6/13 around noon. Unclear if aPTT elevated due to liver dysfunction/congestion, underlying coagulopathy, or due to argatroban . Half life of argatroban  even with liver dysfunction approximately 190 minutes so would anticipate this to be out of patient's system given it has been off since 6/13. T-bili during last admission around 3 with no baseline aPTT reading. INR upon admission 11/2023 was 3.5 INR this admission was 2.9 but noted on Eliquis  PTA. Patient's baseline suspected to be around 55. Due to  liver impairment will transition to bival while on CRRT. In addition to Afib, patient high risk for PE with weight, low EF, and minimal movement.   Bivalrudin has been off since 6/23 @ 20:00. Half life bival while on dialysis approximately 3.5 hours. Plan was to resume 6/26 however patient had some bloody emesis and continued to hold. Resumed 6/27, aPTT has been variable and required numerous dose decreases over the weekend.   aPTT 93 sec therapeutic on infusion at 0.008 mg/kg/min. No further bleeding noted. Noted liver dysfunction remains stable. INR today elevated 3.7 (note that bival can increase INR potentially around 0.5 from baseline), 6/28 HgB 8.2 and PLTs 91. Noted patient underwent HD this morning.   6/29 PM - aPTT 85 sec at goal but trending down on 0.008 mg/kg/min.  Repeat CBC this afternoon is stable - Hgb 8.5, pltc 87 (slightly down).  No issues per RN.  Previous aPTT draw ~4h after end of iHD session this morning.  Will obtain confirmatory level to ensure no further drops.   Goal of Therapy:  aPTT: 80-100, 1.5x baseline of approx 55-60, lower end range w/ recent bleeding  Monitor platelets by anticoagulation protocol: Yes   Plan:  Continue bivalrudin to 0.008 mg/kg/min utilizing adjusted body weight Recheck aPTT in 4 hours Daily CBC, aPTT  Maurilio Fila, PharmD Clinical Pharmacist 01/22/2024  4:20 PM

## 2024-01-23 ENCOUNTER — Inpatient Hospital Stay (HOSPITAL_COMMUNITY)

## 2024-01-23 DIAGNOSIS — I5082 Biventricular heart failure: Secondary | ICD-10-CM | POA: Diagnosis not present

## 2024-01-23 DIAGNOSIS — D72829 Elevated white blood cell count, unspecified: Secondary | ICD-10-CM | POA: Diagnosis not present

## 2024-01-23 DIAGNOSIS — A419 Sepsis, unspecified organism: Secondary | ICD-10-CM | POA: Diagnosis not present

## 2024-01-23 DIAGNOSIS — R579 Shock, unspecified: Secondary | ICD-10-CM | POA: Diagnosis not present

## 2024-01-23 DIAGNOSIS — Z7189 Other specified counseling: Secondary | ICD-10-CM | POA: Diagnosis not present

## 2024-01-23 DIAGNOSIS — Z515 Encounter for palliative care: Secondary | ICD-10-CM | POA: Diagnosis not present

## 2024-01-23 DIAGNOSIS — E440 Moderate protein-calorie malnutrition: Secondary | ICD-10-CM

## 2024-01-23 LAB — CBC
HCT: 24.1 % — ABNORMAL LOW (ref 39.0–52.0)
Hemoglobin: 8.4 g/dL — ABNORMAL LOW (ref 13.0–17.0)
MCH: 29.8 pg (ref 26.0–34.0)
MCHC: 34.9 g/dL (ref 30.0–36.0)
MCV: 85.5 fL (ref 80.0–100.0)
Platelets: 86 10*3/uL — ABNORMAL LOW (ref 150–400)
RBC: 2.82 MIL/uL — ABNORMAL LOW (ref 4.22–5.81)
RDW: 22.6 % — ABNORMAL HIGH (ref 11.5–15.5)
WBC: 44.6 10*3/uL — ABNORMAL HIGH (ref 4.0–10.5)
nRBC: 0.1 % (ref 0.0–0.2)

## 2024-01-23 LAB — BASIC METABOLIC PANEL WITH GFR
Anion gap: 25 — ABNORMAL HIGH (ref 5–15)
BUN: 46 mg/dL — ABNORMAL HIGH (ref 6–20)
CO2: 17 mmol/L — ABNORMAL LOW (ref 22–32)
Calcium: 8.4 mg/dL — ABNORMAL LOW (ref 8.9–10.3)
Chloride: 93 mmol/L — ABNORMAL LOW (ref 98–111)
Creatinine, Ser: 3.42 mg/dL — ABNORMAL HIGH (ref 0.61–1.24)
GFR, Estimated: 23 mL/min — ABNORMAL LOW (ref >60)
Glucose, Bld: 93 mg/dL (ref 70–99)
Potassium: 4.9 mmol/L (ref 3.5–5.1)
Sodium: 135 mmol/L (ref 135–145)

## 2024-01-23 LAB — APTT
aPTT: 100 s — ABNORMAL HIGH (ref 24–36)
aPTT: 99 s — ABNORMAL HIGH (ref 24–36)
aPTT: 104 s — ABNORMAL HIGH (ref 24–36)

## 2024-01-23 LAB — PROTIME-INR
INR: 4.8 (ref 0.8–1.2)
Prothrombin Time: 46.7 s — ABNORMAL HIGH (ref 11.4–15.2)

## 2024-01-23 LAB — COOXEMETRY PANEL
Carboxyhemoglobin: 2.1 % — ABNORMAL HIGH (ref 0.5–1.5)
Methemoglobin: 0.8 % (ref 0.0–1.5)
O2 Saturation: 65.3 %
Total hemoglobin: 8.6 g/dL — ABNORMAL LOW (ref 12.0–16.0)

## 2024-01-23 MED ORDER — SODIUM CHLORIDE 0.9 % IV SOLN
1.0000 g | Freq: Every day | INTRAVENOUS | Status: DC
  Administered 2024-01-23 – 2024-01-24 (×2): 1 g via INTRAVENOUS
  Filled 2024-01-23 (×2): qty 20

## 2024-01-23 MED ORDER — RENA-VITE PO TABS
1.0000 | ORAL_TABLET | Freq: Every day | ORAL | Status: DC
  Administered 2024-01-23 – 2024-01-24 (×2): 1 via ORAL
  Filled 2024-01-23 (×2): qty 1

## 2024-01-23 MED ORDER — SODIUM BICARBONATE 650 MG PO TABS
650.0000 mg | ORAL_TABLET | Freq: Three times a day (TID) | ORAL | Status: DC
  Administered 2024-01-23 – 2024-01-25 (×7): 650 mg via ORAL
  Filled 2024-01-23 (×7): qty 1

## 2024-01-23 MED ORDER — NEPRO/CARBSTEADY PO LIQD
237.0000 mL | Freq: Three times a day (TID) | ORAL | Status: DC
  Administered 2024-01-23 – 2024-01-24 (×2): 237 mL via ORAL

## 2024-01-23 NOTE — Progress Notes (Signed)
 Nutrition Follow-up  DOCUMENTATION CODES:  Morbid obesity  INTERVENTION:  Continue liberalized to regular with 1.2L fluid restriction Encourage PO intake Increase Nepro Shake po TID, each supplement provides 425 kcal and 19 grams protein Renal MVI  NUTRITION DIAGNOSIS:  Increased nutrient needs related to chronic illness as evidenced by estimated needs. - Ongoing  GOAL:  Patient will meet greater than or equal to 90% of their needs - Ongoing   MONITOR:  PO intake, Labs, I & O's, Weight trends, Skin  REASON FOR ASSESSMENT:  Consult Assessment of nutrition requirement/status  ASSESSMENT:   Pt with hx of CHF, COPD, GERD, HTN, morbid obesity, and ESRD on HD presented to ED with concern for wound infections after leaving [facility] the day prior. Pt with recent admission in May for similar presentation.  6/10 - Admitted to ICU and started on CRRT 6/19 - CRRT stopped 6/23 - CRRT to be restarted on a trial x 3 days; NGT placed to LIWS 6/26 - CRRT stopped; diet advanced to clear liquids 6/27 - NGT removed; diet advanced to regular  Pt with pressor requirement. CRRT trial done, possible HD tomorrow pending norepinephrine  dose.   RN reports that he ate some applesauce, juice and Nepro. Unsure how he was eating over the weekend. RD to increase Nepro to TID due to ongoing limited PO intake.   Admit weight: 180 kg   Current weight: 164.3 kg (6/28) EDW: 155.5 kg   Nutrition Related Medications: Protonix , Sodium Bicarbonate  tablet Drips  Angiomax  Levophed  36 mcg/min  Labs: reviewed   Net UF on 6/29: 0 mL  Diet Order:   Diet Order             Diet regular Room service appropriate? Yes; Fluid consistency: Thin  Diet effective now                  EDUCATION NEEDS:  No education needs have been identified at this time  Skin:  Skin Assessment: Reviewed RN Assessment  Last BM:  6/28  Height:  Ht Readings from Last 1 Encounters:  01/22/24 6' (1.829 m)   Weight:  Wt  Readings from Last 1 Encounters:  01/21/24 (!) 164.3 kg   Ideal Body Weight:  80.9 kg  BMI:  Body mass index is 49.13 kg/m.  Estimated Nutritional Needs:  Kcal:  2400-2600 kcal/d Protein:  140-160 g/d Fluid:  1L+UOP    Nestora Glatter RD, LDN Clinical Dietitian

## 2024-01-23 NOTE — Progress Notes (Signed)
 eLink Physician-Brief Progress Note Patient Name: Phillip Young DOB: May 19, 1986 MRN: [id number]   Date of Service  01/23/2024  HPI/Events of Note  Escalating vasopressor doses, asked to assess about adding vasopressin .  Patient is not able to tolerate laying flat due to dyspnea and increased work of breathing  eICU Interventions  Per primary team, not escalating beyond 40 mcg of norepinephrine   If the patient is unable to lay flat, unable to proceed with CT at this time, hold routine imaging for tonight     Intervention Category Minor Interventions: Clinical assessment - ordering diagnostic tests  Athziry Millican 01/23/2024, 4:22 AM

## 2024-01-23 NOTE — Progress Notes (Signed)
 PHARMACY - ANTICOAGULATION  Pharmacy Consult for bivalirudin    Indication: atrial fibrillation w/ hx of HIT  Brief A/P: aPTT within goal range No change to bivalirudin   Patient Measurements: Height: 6' (182.9 cm) Weight: (!) 164.3 kg (362 lb 3.5 oz) IBW/kg (Calculated) : 77.6 HEPARIN  DW (KG): 125.9  Vital Signs: Temp: 97.6 F (36.4 C) (06/30 0817) Temp Source: Axillary (06/30 0817) BP: 91/50 (06/30 0830) Pulse Rate: 110 (06/30 0830)  Labs: Recent Labs    01/21/24 0352 01/21/24 0721 01/21/24 1353 01/22/24 0418 01/22/24 0959 01/22/24 1456 01/22/24 1457 01/22/24 2237 01/23/24 0317  HGB 8.2*  --   --   --   --   --  8.5*  --  8.4*  HCT 23.7*  --   --   --   --   --  24.3*  --  24.1*  PLT 91*  --   --   --   --   --  87*  --  86*  APTT 101*  --    < >  --    < >  --  85* 98* 100*  LABPROT  --   --   --  38.3*  --   --   --   --  46.7*  INR  --   --   --  3.7*  --   --   --   --  4.8*  CREATININE  --  3.34*  --   --   --  2.94*  --   --  3.42*   < > = values in this interval not displayed.    Estimated Creatinine Clearance: 46.5 mL/min (A) (by C-G formula based on SCr of 3.42 mg/dL (H)).  Assessment: 38 y.o. male with h/o Afib, Eliquis  on hold for CRRT, for bivalirudin  due to h/o HIT.   aPTT trend up at 100 sec on current rate of 0.008 mg/kg/hr.  Next HD Tuesday.  Goal of Therapy:  aPTT: 80-100 sec Monitor platelets by anticoagulation protocol: Yes   Plan:  Reduce bivalrudin to 0.007 mg/kg/hr. Recheck aPTT in 4 hrs.   Harlene Boga, PharmD, BCPS, BCCCP Please refer to [facility] for [facility] numbers 01/23/2024  9:40 AM
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
## 2024-01-23 NOTE — Progress Notes (Signed)
 [facility] NEPHROLOGY PROGRESS NOTE  Summary: Pt is a 38 y.o. yo male with past medical history of biventricular heart failure EF less than 20, chronic A-fib, ESRD on HD, chronic hypotension on high-dose midodrine , nonadherence with outpatient HD and treatment, recently at [facility] presented with shock and volume overload.  Subjective:   On extensive dose of pressors this morning.  Screaming out in pain but difficulty giving exact details of what is bothering him.  Objective Vital signs in last 24 hours: Vitals:   01/23/24 0745 01/23/24 0800 01/23/24 0817 01/23/24 0830  BP: (!) 87/63 93/60  (!) 91/50  Pulse: (!) 105 (!) 105  (!) 110  Resp: 17 (!) 21  20  Temp:   97.6 F (36.4 C)   TempSrc:   Axillary   SpO2: 98% 99%  99%  Weight:      Height:       Physical Exam: General: Yelling in pain, appears uncomfortable Heart: Tachycardic, no rub Lungs: Bilateral distant breath sound Abdomen:soft, mildly distended, obese Extremities: 1+ bilat LE edema, chronic venous stasis changes with some skin sloughing of the lower extremities Dialysis Access: TDC  OP HD: MWF [facility] 4.5h   B400  180kg  TDC  NO heparin  (h/o HIT) Last OP HD 5/09, post wt 190kg  Assessment/ Plan # Shock, cardiogenic:  12/06/23 EF < 20%. Sepsis less likely with neg cultures, abx off. Not a candidate for advanced therapies  # ESRD:  on HD since 01/2021. Usual HD at [facility]. Noncompliant with outpatient HD with chronic hyperkalemia.  Was on CRRT 6/10- 6/19, then was dc'd. He remained too unstable for iHD. Discussed EOL issues 6/23 w/ pt's mother and she agreed for a 3 day trial of CRRT which ended 6/26. Plan was for iHD as tolerated, no further CRRT. Tolerated iHD in ICU Sat night overnight w/ not removing any fluid. Next HD Tuesday but on extensive dose of norepinephrine .  Would need to be on ideally norepinephrine  5 mcg before proceeding with dialysis.  Unlikely to tolerate tomorrow.  Continue to monitor progress.   Prognosis guarded  # Anemia of ESRD: cont weekly darbe sq 150 mcg . Transfuse as needed.  # Chronic hypotension/volume: on midodrine  40mg  tid, norepinephrine  per primary team  # a fib: HR has been running in the 110s-130s  #HIT: avoiding heparin   #Elevated Tbili: with synthetic dysfunction  # Possible calciphylaxis: bilat anterior thighs w/ sig areas of skin hardening + darkening w/ associated sloughing. Potentially this is calciphylaxis. Avoiding Ca/ vit D products. Started on IV sodium thiosulfate  tiw.    01/23/2024, 10:52 AM  Recent Labs  Lab 01/19/24 1600 01/20/24 0415 01/20/24 0416 01/21/24 0352 01/21/24 0721 01/22/24 0418 01/22/24 1456 01/22/24 1457 01/23/24 0317  HGB  --    < >  --  8.2*  --   --   --  8.5* 8.4*  ALBUMIN  <1.5*   < > <1.5* <1.5*  --  <1.5*  --   --   --   CALCIUM  7.9*  --  8.7*  --    < >  --  8.6*  --  8.4*  PHOS 3.4  --  3.0  --   --   --   --   --   --   CREATININE 1.84*  --  2.32*  --    < >  --  2.94*  --  3.42*  K 3.7  --  4.1  --    < >  --  4.5  --  4.9   < > = values in this interval not displayed.    Inpatient medications:  Chlorhexidine  Gluconate Cloth  6 each Topical Daily   Chlorhexidine  Gluconate Cloth  6 each Topical Q0600   Darbepoetin Alfa   150 mcg Subcutaneous Q7 days   feeding supplement (NEPRO CARB STEADY)  237 mL Oral BID BM   leptospermum manuka honey  1 Application Topical Daily   liver oil-zinc  oxide   Topical BID   midodrine   40 mg Oral TID WC   mouth rinse  15 mL Mouth Rinse 4 times per day   pantoprazole  (PROTONIX ) IV  40 mg Intravenous Q12H   sodium bicarbonate   650 mg Oral TID   ursodiol   300 mg Oral BID   vancomycin  variable dose per unstable renal function (pharmacist dosing)   Does not apply See admin instructions    anticoagulant sodium citrate      anticoagulant sodium citrate      anticoagulant sodium citrate      bivalirudin  (ANGIOMAX ) 250 mg in sodium chloride  0.9 % 500 mL (0.5 mg/mL) infusion 0.008 mg/kg/hr  (01/23/24 0800)   meropenem  (MERREM ) IV     norepinephrine  (LEVOPHED ) Adult infusion 36 mcg/min (01/23/24 0951)   sodium thiosulfate  25 g in sodium chloride  0.9 % 200 mL Infusion for Calciphylaxis 25 g (01/22/24 0537)   alteplase , anticoagulant sodium citrate , anticoagulant sodium citrate , camphor-menthol , feeding supplement (NEPRO CARB STEADY), HYDROmorphone  (DILAUDID ) injection, lidocaine  (PF), lidocaine -prilocaine , ondansetron  (ZOFRAN ) IV, mouth rinse, pentafluoroprop-tetrafluoroeth

## 2024-01-23 NOTE — Progress Notes (Addendum)
   01/23/24 0310  Provider Notification  Provider Name/Title Ema Breath RN)  Date Provider Notified 01/23/24  Time Provider Notified [phone]  Method of Notification Call  Notification Reason Change in status (patient now requiring 35mcg/min of levophed  to achieve SBP >90/MAP>65 per order, requesting vasopressin  gtt, discussed CT C/A/P scheduled for 0500)  Provider response Evaluate remotely    0425- Attempted to lay patient flat in bed numerous times throughout the shift to see if he would tolerate ordered CT scan. He immediately complains of severe back pain and becomes SOB, tachypneic, and tachycardic. PRN dilaudid  1mg  does greatly help him but drops his BP below SBP and MAP goals, requiring up titration of levophed  gtt. Unable to give PRN dilaudid  to be able to tolerate the CT scan as patient nearing max dose of levophed . Discussed with [facility] RN and Paliwal MD. See note from Paliwal MD

## 2024-01-23 NOTE — Progress Notes (Addendum)
 PHARMACY - ANTICOAGULATION  Pharmacy Consult for bivalirudin    Indication: atrial fibrillation w/ hx of HIT   Patient Measurements: Height: 6' (182.9 cm) Weight: (!) 164.3 kg (362 lb 3.5 oz) IBW/kg (Calculated) : 77.6 HEPARIN  DW (KG): 125.9  Vital Signs: Temp: 96.6 F (35.9 C) (06/30 1130) Temp Source: Axillary (06/30 1130) BP: 99/46 (06/30 1400) Pulse Rate: 106 (06/30 1400)  Labs: Recent Labs    01/21/24 0352 01/21/24 0721 01/21/24 1353 01/22/24 0418 01/22/24 0959 01/22/24 1456 01/22/24 1457 01/22/24 2237 01/23/24 0317 01/23/24 1400  HGB 8.2*  --   --   --   --   --  8.5*  --  8.4*  --   HCT 23.7*  --   --   --   --   --  24.3*  --  24.1*  --   PLT 91*  --   --   --   --   --  87*  --  86*  --   APTT 101*  --    < >  --    < >  --  85* 98* 100* 99*  LABPROT  --   --   --  38.3*  --   --   --   --  46.7*  --   INR  --   --   --  3.7*  --   --   --   --  4.8*  --   CREATININE  --  3.34*  --   --   --  2.94*  --   --  3.42*  --    < > = values in this interval not displayed.    Estimated Creatinine Clearance: 46.5 mL/min (A) (by C-G formula based on SCr of 3.42 mg/dL (H)).  Assessment: 38 y.o. male with h/o Afib, Eliquis  on hold for CRRT, for bivalirudin  due to h/o HIT.   aPTT remains on higher end of goal range @ 99 sec on current rate of 0.007 mg/kg/hr (recently reduced from 0.008).  Next HD Tuesday.  Goal of Therapy:  aPTT: 80-100 sec Monitor platelets by anticoagulation protocol: Yes   Plan:  Reduce bivalrudin to 0.006 mg/kg/hr. Recheck aPTT in 4 hrs.   Rankin Sams, PharmD, BCPS, BCCCP Clinical Pharmacist  Addendum:  Repeat aPTT increased to 104 and is supratherapeutic despite dose decrease  Decrease further to 0.005 mg/kg/hr  Recheck aPTT in 4 hours   Rankin Sams, PharmD, BCPS, BCCCP Clinical Pharmacist

## 2024-01-23 NOTE — Progress Notes (Signed)
 NAME:  Phillip Young, MRN:  [id number], DOB:  03-05-1986, LOS: 20 ADMISSION DATE:  01/03/2024, CONSULTATION DATE:  01/03/24 REFERRING MD:  Dr. Patsey, CHIEF COMPLAINT:  SOB/ chills   History of Present Illness:  34 yoM w/PMH as below significant for biventricular HF (EF < 20%), chronic afib on Eliquis , chronic hypotension on midodrine  30mg  TID, ESRD, chronic hypoxic and hypercarbic respiratory failure on NIV, morbid obesity, medical non-compliance, and chronic pain presenting from home after being discharged from [facility] 6/9 with SOB, chills, subjective fever, ongoing poor PO, vomiting today strings of blood but then reported it was with coughing episodes, pain all over, and increased weeping from left thigh blister.  Had BM, unclear on consistency/ color.  Reports last dialysis yesterday.  Initially reports not taking any PO meds today but then said he did this morning.  Non- ambulatory at baseline.  Frequent admits, most recently 5/13-5/22 for recurrent septic shock and MRSE bacteremia due to suspected line complicated by acute on chronic HF and respiratory failure, suspected IE but not a candidate for TEE, discharged on vancomycin  with HD scheduled thru 6/30.  TDC was replaced 5/22 after 2 day line holiday.  Also started on micafungin  w/HD at [facility] after [facility] grew candida species, scheduled thru 6/30.   In ER, temp 99.1, on HFNC at 6L, with SBP into 70's requiring NE support after 500 ml bolus.  Labs significant for Na 129, K 6.3, Cl 89, alk phos 346, AST 84, t.bili 12 (recent 3.4 on 5/14), lactic 3.8, WBC 20.6 (previously 8.1 on 5/22), INR 2.9, CXR showing vascular congestion and increasing congestion and pleural effusions.  BC sent and empirically started on linezolid , flagyl , and cefepime .  Pending CT chest/abd/ pelvis.  Nephrology consulted.  PCCM called for admit.   Pertinent  Medical History   Past Medical History:  Diagnosis Date   Acute on chronic respiratory failure with hypoxia ([name])  04/21/2021   Acute on chronic systolic (congestive) heart failure ([name]) 02/26/2020   Amiodarone  toxicity    Anemia    Biventricular congestive heart failure ([name])    Candida parapsilosis infection 12/26/2023   Chronic hypoxemic respiratory failure ([name])    Class 3 severe obesity due to excess calories with serious comorbidity and body mass index (BMI) of 50.0 to 59.9 in adult 02/26/2020   ESRD on hemodialysis [facility])    Essential hypertension 02/26/2020   GERD without esophagitis 02/26/2020   Hidradenitis suppurativa 02/26/2020   NICM (nonischemic cardiomyopathy) ([name])    Obesity hypoventilation syndrome ([name])    OSA (obstructive sleep apnea)    Pneumonia    Prediabetes 02/26/2020   Significant Hospital Events: Including procedures, antibiotic start and stop dates in addition to other pertinent events   6/10 Admit 6/12 Remains on vasopressors.  Spoke to heart failure.  Not a candidate for advanced therapies.  Not a candidate for tailored hemodynamics.  Recommend a strategy of vasopressor support at fluid removal in an attempt to transition back to IHD. 6/15 patient brought up discussion of limiting some of his care; Palliative re-consulted.  6/16 palliative consulted per patient's request. CT unrevealing for cause of abdominal pain. 6/23 nephro decided to restart CRRT despite prior nephro stopping. Remains hypotensive, mother did not consent central line, PICC delayed, peripheral pressors and resume CRRT, developed ileus vs SBO, NG placed to [facility] 6/24 PICC could not be placed, mother refused central line again 6/25 central line placed 6/26 CRRT stopped 6/27 on low dose levophed  6/28 on levophed  3-55mcg/min, tolerated iHD  6/29 increasing levophed  requirements  Interim History / Subjective:  He is on of levophed  this morning He is in a great deal of pain all over  Objective    Blood pressure (!) 75/54, pulse (!) 112, temperature 97.7 F (36.5 C), temperature source Axillary,  resp. rate (!) 23, height 6' (1.829 m), weight (!) 164.3 kg, SpO2 99%.        Intake/Output Summary (Last 24 hours) at 01/23/2024 0809 Last data filed at 01/23/2024 0700 Gross per 24 hour  Intake 986.39 ml  Output --  Net 986.39 ml   Filed Weights   01/19/24 0500 01/20/24 0600 01/21/24 0400  Weight: (!) 162.6 kg (!) 163 kg (!) 164.3 kg   Examination: Chronically ill appearing obese man, laying in bed, in moderate discomfort On nasal cannula Breath and lung sounds diminished RRR No wheeze Abdomen obese, soft, non-tender Non pitting peripheral edema vs lymphedema Faint breathy and soft voice, very weak, unable to sit up independently, alert follows commands and conversant   Resolved problem list:    Assessment and Plan  Shock ([name])> suspect primarily cardiogenic Continue vasopressor support with Levophed , MAP goal 60 or SBP 90 Continue IV vancomycin  to treat cellulitis end 6/30 Start Meropenem  for empiric coverage today Not a candidate for advanced heart failure therapy Patient mother refused solucortef 6/23 for possible adrenal insufficiency  Central line placed 6/25, cap pressors at NE max dose 40 Check Coox today Continue midodrine    Leukocytosis: Rising WBC, pressors up and down, no fever, unable to remove central line with ongoing pressor need and possible iHD.  CTM Receiving course of vanc which stops today 6/30 Add empiric meropenem  Unable to get CT scans as he is unable to lay completely flat.   Hyperbilirubinemia-- POA and present 5/25 now worsened, congestive vs low forward flow state. 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 No signs of obstruction.  Acaculous cholecystitis is possible> no suggestion of this on any of his imaging. Patent hepatic  portal veins so no Budd Chiari. Low suspicion for DILI with normal transaminases and no obvious culprit.  Continue trial of ursodiol , bili seems trending down mildly, stable ~19  Acute on chronic biventricular congestive heart failure ([name])-combined sysolic & diastolic; known NICM. Echo shows severe RV and LV failure with EF <20%. Non-compliant for many years. Per advanced heart failure, patient is not a candidate for advanced therapy Unable to tolerate GDMT in the setting of hypotension CRRT was stopped, still requiring vasopressor support, would not tolerate IHD, CRRT resumed time limited trial 6/23 for 3 days to end AM 6/25 If he is unable to wean off pressors, hospice care will be the only reasonable option.  Right now I think it is highly unlikely he will be able to resume iHD with his current status.  Palliative care reconsulted and now following peripherally.  ESRD on hemodialysis CRRT was stopped, discussed with nephro utility to resume, time limited trial again after discussion 6/23 and ended 6/26 He had iHD 6/29 AM Discussed with nephrology this morning, if he remains on high dose norepinephrine  he is not a candidate for iHD.  Constipation SBO vs Ileus on plain film 6/23 (resolved) Had BM 6/21. Continue to offer bowel regimen  Obesity hypoventilation syndrome - Intermittently compliant with home BIPAP. Recommend at bedtime or when sleeping  otherwise he use bipap; patient intermittently declining   Obesity, BMI 49 Moderate Protein Calorie Malnutrition - feeding supplement as tolerated - nutrition evaluation  Permanent atrial fibrillation  Amiodarone  intolerant Heart rate is controlled Bival stopped with bleeding wounds 6/24, resume 6/25 ad hgb stable and no further bleeding Due to hyperbilirubinemia, can not switch him to Eliquis   Digoxin  repeat dose 6/29 - will hold addl doses as unlikely will tolerate iHD  Chronic pain disorder Continue pregabalin  Dilaudid  PRN  Mild  hyperammonemia Lactulose  BID; he has been refusing  Skin ulceration; recent cellulitis  Has chronic skin ulceration, and he routinely has been refusing dressing changes. He understands he will not be able to make progress if we cannot keep his wounds clean. On Vanc- dosing per pharmacy - to end 6/30  Long term prognosis remains grim. No good options if he is not able to tolerate iHD. Appreciate Palliative care team's management. Most likely outcome is in hospital death. GOC remain full code yet at times refuse appropriate and aggressive medical care as recommended. Not amenable currently to discussion about imminent death.    Best Practice (right click and Reselect all SmartList Selections daily)   Diet/type: Regular consistency (see orders) DVT prophylaxis SCD, bival gtt Pressure ulcer(s): present on admission  GI prophylaxis: PPI Lines: Dialysis Catheter-pta R TDC (12/15/23) Foley:  N/A Code Status:  full code Last date of multidisciplinary goals of care discussion - discussed with mom bedside 6/29, discussed with CMO Gordy Pina, MD 6/23.   CRITICAL CARE Performed by: Dorn KATHEE Chill   Total critical care time: 35 minutes  Critical care time was exclusive of separately billable procedures and treating other patients.  Critical care was necessary to treat or prevent imminent or life-threatening deterioration.  Critical care was time spent personally by me on the following activities: development of treatment plan with patient and/or surrogate as well as nursing, discussions with consultants, evaluation of patient's response to treatment, examination of patient, obtaining history from patient or surrogate, ordering and performing treatments and interventions, ordering and review of laboratory studies, ordering and review of radiographic studies, pulse oximetry and re-evaluation of patient's condition.      Dorn Chill, MD [facility] Office:  [phone]   See Amion for personal pager PCCM on call pager [phone] until 7pm. Please call Elink 7p-7a. [phone]

## 2024-01-23 NOTE — Progress Notes (Signed)
 Daily Progress Note   Patient Name: Phillip Young       Date: 01/23/2024 DOB: 09/08/1985  Age: 38 y.o. MRN#: [id number] Attending Physician: Phillip Dorn NOVAK, MD Primary Care Physician: Phillip Bruckner, DO Admit Date: 01/03/2024  Reason for Consultation/Follow-up: Establishing goals of care  Subjective: Medical records reviewed including progress notes, labs, imaging. Patient assessed at the bedside. He is lethargic and his mother is visiting at the bedside with RN present. His mother immediately stated I don't want to see you right now, it is not a good time. I informed patient that I was attempting to visit for support and that I would be available at a later time if he would find a visit helpful. He stated okay. I then excused myself per mother's wishes.   Questions and concerns addressed. PMT will continue to support holistically.   Length of Stay: 20   Physical Exam Vitals and nursing note reviewed.  Constitutional:      General: He is not in acute distress.    Appearance: He is ill-appearing.   Cardiovascular:     Comments: AFIB Pulmonary:     Effort: Tachypnea present.   Skin:    General: Skin is warm and dry.   Neurological:     Mental Status: He is lethargic.   Psychiatric:     Comments: restless             Vital Signs: BP (!) 75/54   Pulse (!) 112   Temp 97.6 F (36.4 C) (Axillary)   Resp (!) 23   Ht 6' (1.829 m)   Wt (!) 164.3 kg   SpO2 99%   BMI 49.13 kg/m  SpO2: SpO2: 99 % O2 Device: O2 Device: Nasal Cannula O2 Flow Rate: O2 Flow Rate (L/min): 5 L/min      Palliative Assessment/Data: 20%   Palliative Care Assessment & Plan   Patient Profile: 38 y.o. male  with past medical history of biventricular HF (EF < 20%), chronic afib on Eliquis , chronic hypotension on midodrine  30mg  TID, ESRD,  chronic hypoxic and hypercarbic respiratory failure on NIV, morbid obesity, medical non-compliance  admitted on 01/03/2024 with pain, left wound weeping.    Patient has had 7 hospitalizations in the past 6 months. Most recent admit 5/13-5/22 for recurrent septic shock and MRSE bacteremia due to suspected line complicated by acute on chronic HF and respiratory failure, suspected IE but not a candidate for TEE, discharged on vancomycin  with HD scheduled thru 6/30.     He is currently admitted for shock (cardiogenic, septic shock likely as well) with acute on chronic CHF and requiring pressors. He has multiple wounds and ulcers but refusing dressing changes. Also with constipation and opting against bowel regimen.    PMT has been consulted to assist with goals of care conversation at patient's request.  Assessment: Goals of care conversation End-stage renal disease on dialysis Cardiogenic shock Acute on chronic CHF biventricular Chronic pain Hyperbilirubinemia End-stage renal disease on CRRT SBO  Recommendations/Plan: Continue full code/full scope treatment Goals are clear for all available interventions to prolong life [name] has stated repeatedly that she will never trust the medical team and her priority is keeping him alive  by any means necessary Patient himself has consistently deferred to mother's preferences each time I have met with them, despite occasionally expressing interest in speaking to PMT about comfort care. He understands the option is available Psychosocial and emotional support provided PMT will continue to follow peripherally   Prognosis: Very poor  Discharge Planning: To Be Determined    Time Total: 25  Visit consisted of counseling and education dealing with the complex and emotionally intense issues of symptom management and palliative care in the setting of serious and potentially life-threatening illness. Greater than 50% of this time was spent  counseling and coordinating care related to the above assessment and plan.  Personally spent 25 minutes in patient care including extensive chart review (labs, imaging, progress/consult notes, vital signs), medically appropraite exam, discussed with treatment team, education to patient, family, and staff, documenting clinical information, medication review and management, coordination of care, and available advanced directive documents.           Phillip Gladu P [name], PA-C  Palliative Medicine Team Team phone # [phone]  Thank you for allowing the Palliative Medicine Team to assist in the care of this patient. Please utilize secure chat with additional questions, if there is no response within 30 minutes please call the above phone number.  Palliative Medicine Team providers are available by phone from 7am to 7pm daily and can be reached through the team cell phone.  Should this patient require assistance outside of these hours, please call the patient's attending physician.

## 2024-01-24 DIAGNOSIS — I5082 Biventricular heart failure: Secondary | ICD-10-CM | POA: Diagnosis not present

## 2024-01-24 DIAGNOSIS — R579 Shock, unspecified: Secondary | ICD-10-CM | POA: Diagnosis not present

## 2024-01-24 DIAGNOSIS — D72829 Elevated white blood cell count, unspecified: Secondary | ICD-10-CM | POA: Diagnosis not present

## 2024-01-24 LAB — PROTIME-INR
INR: 3.9 — ABNORMAL HIGH (ref 0.8–1.2)
Prothrombin Time: 40.3 s — ABNORMAL HIGH (ref 11.4–15.2)

## 2024-01-24 LAB — CBC
HCT: 24 % — ABNORMAL LOW (ref 39.0–52.0)
HCT: 22.3 % — ABNORMAL LOW (ref 39.0–52.0)
Hemoglobin: 8.2 g/dL — ABNORMAL LOW (ref 13.0–17.0)
Hemoglobin: 7.7 g/dL — ABNORMAL LOW (ref 13.0–17.0)
MCH: 29.5 pg (ref 26.0–34.0)
MCH: 29.7 pg (ref 26.0–34.0)
MCHC: 34.2 g/dL (ref 30.0–36.0)
MCHC: 34.5 g/dL (ref 30.0–36.0)
MCV: 86.3 fL (ref 80.0–100.0)
MCV: 86.1 fL (ref 80.0–100.0)
Platelets: 80 10*3/uL — ABNORMAL LOW (ref 150–400)
Platelets: 81 10*3/uL — ABNORMAL LOW (ref 150–400)
RBC: 2.78 MIL/uL — ABNORMAL LOW (ref 4.22–5.81)
RBC: 2.59 MIL/uL — ABNORMAL LOW (ref 4.22–5.81)
RDW: 22.6 % — ABNORMAL HIGH (ref 11.5–15.5)
RDW: 22.5 % — ABNORMAL HIGH (ref 11.5–15.5)
WBC: 44.4 10*3/uL — ABNORMAL HIGH (ref 4.0–10.5)
WBC: 42 10*3/uL — ABNORMAL HIGH (ref 4.0–10.5)
nRBC: 0.2 % (ref 0.0–0.2)
nRBC: 0.3 % — ABNORMAL HIGH (ref 0.0–0.2)

## 2024-01-24 LAB — DIFFERENTIAL
Abs Immature Granulocytes: 1.21 10*3/uL — ABNORMAL HIGH (ref 0.00–0.07)
Basophils Absolute: 0.2 10*3/uL — ABNORMAL HIGH (ref 0.0–0.1)
Basophils Relative: 0 %
Eosinophils Absolute: 0.2 10*3/uL (ref 0.0–0.5)
Eosinophils Relative: 1 %
Immature Granulocytes: 3 %
Lymphocytes Relative: 3 %
Lymphs Abs: 1 10*3/uL (ref 0.7–4.0)
Monocytes Absolute: 0.8 10*3/uL (ref 0.1–1.0)
Monocytes Relative: 2 %
Neutro Abs: 38.6 10*3/uL — ABNORMAL HIGH (ref 1.7–7.7)
Neutrophils Relative %: 91 %
Smear Review: DECREASED
WBC Morphology: INCREASED

## 2024-01-24 LAB — COOXEMETRY PANEL
Carboxyhemoglobin: 1.8 % — ABNORMAL HIGH (ref 0.5–1.5)
Methemoglobin: <0.7 % (ref 0.0–1.5)
O2 Saturation: 62.9 %
Total hemoglobin: 8.8 g/dL — ABNORMAL LOW (ref 12.0–16.0)

## 2024-01-24 LAB — RENAL FUNCTION PANEL
Albumin: <1.5 g/dL — ABNORMAL LOW (ref 3.5–5.0)
Anion gap: 23 — ABNORMAL HIGH (ref 5–15)
BUN: 61 mg/dL — ABNORMAL HIGH (ref 6–20)
CO2: 17 mmol/L — ABNORMAL LOW (ref 22–32)
Calcium: 8.3 mg/dL — ABNORMAL LOW (ref 8.9–10.3)
Chloride: 92 mmol/L — ABNORMAL LOW (ref 98–111)
Creatinine, Ser: 4.05 mg/dL — ABNORMAL HIGH (ref 0.61–1.24)
GFR, Estimated: 18 mL/min — ABNORMAL LOW (ref >60)
Glucose, Bld: 90 mg/dL (ref 70–99)
Phosphorus: 5.7 mg/dL — ABNORMAL HIGH (ref 2.5–4.6)
Potassium: 5.4 mmol/L — ABNORMAL HIGH (ref 3.5–5.1)
Sodium: 132 mmol/L — ABNORMAL LOW (ref 135–145)

## 2024-01-24 LAB — LACTIC ACID, PLASMA
Lactic Acid, Venous: 3.9 mmol/L (ref 0.5–1.9)
Lactic Acid, Venous: 5.3 mmol/L (ref 0.5–1.9)
Lactic Acid, Venous: 4.1 mmol/L (ref 0.5–1.9)

## 2024-01-24 LAB — PROCALCITONIN: Procalcitonin: 32.57 ng/mL

## 2024-01-24 LAB — APTT
aPTT: 93 s — ABNORMAL HIGH (ref 24–36)
aPTT: 98 s — ABNORMAL HIGH (ref 24–36)
aPTT: 89 s — ABNORMAL HIGH (ref 24–36)

## 2024-01-24 MED ORDER — HYDROMORPHONE HCL 1 MG/ML IJ SOLN
0.5000 mg | INTRAMUSCULAR | Status: DC | PRN
  Administered 2024-01-24 – 2024-01-25 (×2): 0.5 mg via INTRAVENOUS
  Filled 2024-01-24 (×2): qty 1

## 2024-01-24 MED ORDER — SODIUM ZIRCONIUM CYCLOSILICATE 10 G PO PACK
10.0000 g | PACK | Freq: Once | ORAL | Status: AC
  Administered 2024-01-24: 10 g via ORAL
  Filled 2024-01-24: qty 1

## 2024-01-24 MED ORDER — LINEZOLID 600 MG/300ML IV SOLN
300.0000 mg | Freq: Two times a day (BID) | INTRAVENOUS | Status: DC
  Administered 2024-01-24 – 2024-01-26 (×5): 300 mg via INTRAVENOUS
  Administered 2024-01-26: 600 mg via INTRAVENOUS
  Administered 2024-01-27 – 2024-01-28 (×3): 300 mg via INTRAVENOUS
  Filled 2024-01-24 (×11): qty 300

## 2024-01-24 MED ORDER — ALBUMIN HUMAN 25 % IV SOLN
25.0000 g | Freq: Four times a day (QID) | INTRAVENOUS | Status: AC
  Administered 2024-01-24 (×3): 25 g via INTRAVENOUS
  Filled 2024-01-24 (×3): qty 100

## 2024-01-24 MED ORDER — LIDOCAINE 4 % EX CREA
TOPICAL_CREAM | Freq: Once | CUTANEOUS | Status: AC
  Administered 2024-01-24: 1 via TOPICAL
  Filled 2024-01-24: qty 5

## 2024-01-24 MED ORDER — SODIUM CHLORIDE 0.9 % IV SOLN: INTRAVENOUS | Status: AC | PRN

## 2024-01-24 NOTE — Procedures (Signed)
 Arterial Catheter Insertion Procedure Note  Gamaliel Charney  [id number]  Jan 11, 1986  Date:01/24/24  Time:1:44 PM    Provider Performing: Toribio JAYSON Sharps    Procedure: Insertion of Arterial Line (63379) with US  guidance (23062)   Indication(s) Blood pressure monitoring and/or need for frequent ABGs  Consent Verbal  Anesthesia Lidocaine  jelly   Time Out Verified patient identification, verified procedure, site/side was marked, verified correct patient position, special equipment/implants available, medications/allergies/relevant history reviewed, required imaging and test results available.   Sterile Technique Maximal sterile technique including full sterile barrier drape, hand hygiene, sterile gown, sterile gloves, mask, hair covering, sterile ultrasound probe cover (if used).   Procedure Description Area of catheter insertion was cleaned with chlorhexidine  and draped in sterile fashion. With real-time ultrasound guidance an arterial catheter was placed into the right radial artery.  Appropriate arterial tracings confirmed on monitor.     Complications/Tolerance None; patient tolerated the procedure well.   EBL Minimal   Specimen(s) None

## 2024-01-24 NOTE — TOC Progression Note (Signed)
 Transition of Care [facility]) - Progression Note    Patient Details  Name: Phillip Young MRN: [id number] Date of Birth: 01/07/1986  Transition of [facility]) CM/SW Contact  Roxie KANDICE Stain, RN Phone Number: 01/24/2024, 1:23 PM  Clinical Narrative:      Patient continues on high does of norepinephrine . Nephrology won't do HD until patient is down to of norepinephrine .  TIC will continue to follow.  Expected Discharge Plan: Long Term Acute Care (LTAC) Barriers to Discharge: Continued Medical Work up  Expected Discharge Plan and Services       Living arrangements for the past 2 months: Apartment                             HH Agency: [facility]         Social Determinants of Health (SDOH) Interventions SDOH Screenings   Food Insecurity: No Food Insecurity (01/08/2024)  Recent Concern: Food Insecurity - Food Insecurity Present (11/01/2023)  Housing: High Risk (01/08/2024)  Transportation Needs: Unmet Transportation Needs (01/08/2024)  Utilities: At Risk (01/08/2024)  Alcohol  Screen: Low Risk  (10/06/2023)  Depression (PHQ2-9): High Risk (10/06/2023)  Financial Resource Strain: Low Risk  (10/06/2023)  Physical Activity: Insufficiently Active (10/06/2023)  Social Connections: Socially Isolated (10/06/2023)  Stress: No Stress Concern Present (10/06/2023)  Tobacco Use: Medium Risk (12/14/2023)  Health Literacy: Adequate Health Literacy (10/06/2023)    Readmission Risk Interventions    12/15/2023    2:07 PM 10/13/2023   11:27 AM 09/23/2023    5:05 PM  Readmission Risk Prevention Plan  Transportation Screening Complete Complete Complete  Medication Review (RN Care Manager) Referral to Pharmacy Referral to Pharmacy Referral to Pharmacy  PCP or Specialist appointment within 3-5 days of discharge Complete  Complete  HRI or Home Care Consult Complete Complete Complete  SW Recovery Care/Counseling Consult Complete Complete Complete  Palliative Care Screening  Not Applicable Not Applicable Not Applicable  Skilled Nursing Facility Not Applicable Not Applicable Not Applicable

## 2024-01-24 NOTE — Progress Notes (Signed)
 CT C/A/P completed and patient returned back to 2M05. Lines intact and VSS throughout transport.

## 2024-01-24 NOTE — Progress Notes (Signed)
 PT Cancellation Note  Patient Details Name: Phillip Young MRN: [id number] DOB: [birth year]   Cancelled Treatment:    Reason Eval/Treat Not Completed: Other (comment); noted higher doses of pressor earlier in the day.  RN reported ok to work later in pm due to titrated down.  Will attempt to see tomorrow.   Montie Portal 01/24/2024, 4:48 PM Micheline Portal, PT Acute Rehabilitation Services [phone] 01/24/2024

## 2024-01-24 NOTE — Progress Notes (Signed)
 [facility] NEPHROLOGY PROGRESS NOTE  Summary: Pt is a 38 y.o. yo male with past medical history of biventricular heart failure EF less than 20, chronic A-fib, ESRD on HD, chronic hypotension on high-dose midodrine , nonadherence with outpatient HD and treatment, recently at [facility] presented with shock and volume overload.  Subjective:   Remains on high dose of norepi. Patient minimally interactive this morning. Care discussed with mother  Objective Vital signs in last 24 hours: Vitals:   01/24/24 0630 01/24/24 0645 01/24/24 0700 01/24/24 0727  BP: (!) 75/56 (!) 73/51 (!) 83/68   Pulse: (!) 116 (!) 109 (!) 122   Resp: 19 (!) 21 (!) 29   Temp:    97.9 F (36.6 C)  TempSrc:    Axillary  SpO2: 98% 99% 98%   Weight:      Height:       Physical Exam: General: lying in bed, nad Heart: Tachycardic, no rub Lungs: Bilateral distant breath sound, mild iwob Abdomen:soft, mildly distended, obese Extremities: 1+ bilat LE edema, chronic venous stasis changes with some skin sloughing of the lower extremities Dialysis Access: TDC  OP HD: MWF [facility] 4.5h   B400  180kg  TDC  NO heparin  (h/o HIT) Last OP HD 5/09, post wt 190kg  Assessment/ Plan # Shock, cardiogenic:  12/06/23 EF < 20%. Sepsis less likely with neg cultures, abx off. Not a candidate for advanced therapies. Likely at end stage of life  # ESRD:  on HD since 01/2021. Usual HD at [facility]. Noncompliant with outpatient HD with chronic hyperkalemia.  Was on CRRT 6/10- 6/19, then was dc'd. He remained too unstable for iHD. Discussed EOL issues 6/23 w/ pt's mother and she agreed for a 3 day trial of CRRT which ended 6/26. Plan was for iHD as tolerated, no further CRRT. Tolerated iHD in ICU Sat night overnight w/ not removing any fluid. Plan was for HD today but on too high a dose of norepi. Discussed with mother that he would need to be on 5mcg of norepi and even with that he may not tolerate UF and we still would not achieve overall  goals. CTM progress  # Anemia of ESRD: cont weekly darbe sq 150 mcg . Transfuse as needed.  # Chronic hypotension/volume: on midodrine  40mg  tid, norepinephrine  per primary team  # a fib: HR has been running in the 110s-130s  #HIT: avoiding heparin   #Elevated Tbili: with synthetic dysfunction  # Possible calciphylaxis: bilat anterior thighs w/ sig areas of skin hardening + darkening w/ associated sloughing. Potentially this is calciphylaxis. Avoiding Ca/ vit D products. Started on IV sodium thiosulfate  tiw. However with worsening acidosis and overall picture will hold his Na thiosulfate    01/24/2024, 10:05 AM  Recent Labs  Lab 01/20/24 0416 01/21/24 0352 01/22/24 0418 01/22/24 1456 01/23/24 0317 01/24/24 0411  HGB  --    < >  --    < > 8.4* 8.2*  ALBUMIN  <1.5*   < > <1.5*  --   --  <1.5*  CALCIUM  8.7*   < >  --    < > 8.4* 8.3*  PHOS 3.0  --   --   --   --  5.7*  CREATININE 2.32*   < >  --    < > 3.42* 4.05*  K 4.1   < >  --    < > 4.9 5.4*   < > = values in this interval not displayed.    Inpatient medications:  Chlorhexidine  Gluconate Cloth  6 each Topical Daily   Darbepoetin Alfa   150 mcg Subcutaneous Q7 days   feeding supplement (NEPRO CARB STEADY)  237 mL Oral TID BM   leptospermum manuka honey  1 Application Topical Daily   liver oil-zinc  oxide   Topical BID   midodrine   40 mg Oral TID WC   multivitamin  1 tablet Oral QHS   mouth rinse  15 mL Mouth Rinse 4 times per day   pantoprazole  (PROTONIX ) IV  40 mg Intravenous Q12H   sodium bicarbonate   650 mg Oral TID   sodium zirconium cyclosilicate   10 g Oral Once   ursodiol   300 mg Oral BID    albumin  human 25 g (01/24/24 0751)   anticoagulant sodium citrate      anticoagulant sodium citrate      anticoagulant sodium citrate      bivalirudin  (ANGIOMAX ) 250 mg in sodium chloride  0.9 % 500 mL (0.5 mg/mL) infusion 0.005 mg/kg/hr (01/24/24 0700)   meropenem  (MERREM ) IV Stopped (01/23/24 1219)   norepinephrine  (LEVOPHED )  Adult infusion 34 mcg/min (01/24/24 0700)   sodium thiosulfate  25 g in sodium chloride  0.9 % 200 mL Infusion for Calciphylaxis 25 g (01/22/24 0537)   alteplase , anticoagulant sodium citrate , anticoagulant sodium citrate , camphor-menthol , HYDROmorphone  (DILAUDID ) injection, lidocaine  (PF), lidocaine -prilocaine , ondansetron  (ZOFRAN ) IV, mouth rinse, pentafluoroprop-tetrafluoroeth

## 2024-01-24 NOTE — Progress Notes (Signed)
 eLink Physician-Brief Progress Note Patient Name: Phillip Young DOB: [birth year] MRN: [id number]   Date of Service  01/24/2024  HPI/Events of Note  Dilaudid  works great for patient's pain, but it is dropping patient's BP. On high dose norepi  eICU Interventions  Maintain Dilaudid      Intervention Category Intermediate Interventions: Pain - evaluation and management  Kendyll Huettner 01/24/2024, 2:51 AM

## 2024-01-24 NOTE — Progress Notes (Signed)
 PHARMACY - ANTICOAGULATION  Pharmacy Consult for bivalirudin    Indication: atrial fibrillation w/ hx of HIT   Patient Measurements: Height: 6' (182.9 cm) Weight: (!) 164.3 kg (362 lb 3.5 oz) IBW/kg (Calculated) : 77.6 HEPARIN  DW (KG): 125.9  Vital Signs: Temp: 97.7 F (36.5 C) (07/01 1129) Temp Source: Axillary (07/01 1129) BP: 83/68 (07/01 1315) Pulse Rate: 110 (07/01 1345)  Labs: Recent Labs    01/22/24 0418 01/22/24 0959 01/22/24 1456 01/22/24 1457 01/22/24 2237 01/23/24 0317 01/23/24 1400 01/23/24 2030 01/24/24 0411 01/24/24 1306  HGB  --    < >  --  8.5*  --  8.4*  --   --  8.2*  --   HCT  --   --   --  24.3*  --  24.1*  --   --  24.0*  --   PLT  --   --   --  87*  --  86*  --   --  80*  --   APTT  --    < >  --  85*   < > 100*   < > 104* 93* 98*  LABPROT 38.3*  --   --   --   --  46.7*  --   --  40.3*  --   INR 3.7*  --   --   --   --  4.8*  --   --  3.9*  --   CREATININE  --   --  2.94*  --   --  3.42*  --   --  4.05*  --    < > = values in this interval not displayed.    Estimated Creatinine Clearance: 39.3 mL/min (A) (by C-G formula based on SCr of 4.05 mg/dL (H)).  Assessment: 38 y.o. male with h/o Afib, Eliquis  on hold for CRRT, for bivalirudin  due to h/o HIT.   aPTT remains on higher end of goal range @ 98 sec on current rate of 0.005 mg/kg/hr (1.1 ml/hr).  No HD today.  Goal of Therapy:  aPTT: 80-100 sec Monitor platelets by anticoagulation protocol: Yes   Plan:  Cont bivalrudin at 0.005 mg/kg/hr (1.1 ml/hr) - unsure we could go any lower and still run via pump.  aPTT BID for now.   Harlene Boga, PharmD, BCPS, BCCCP Please refer to [facility] for [facility] Pharmacy numbers

## 2024-01-24 NOTE — Progress Notes (Signed)
 PHARMACY - ANTICOAGULATION  Pharmacy Consult for bivalirudin    Indication: atrial fibrillation w/ hx of HIT   Patient Measurements: Height: 6' (182.9 cm) Weight: (!) 164.3 kg (362 lb 3.5 oz) IBW/kg (Calculated) : 77.6 HEPARIN  DW (KG): 125.9  Vital Signs: Temp: 98.2 F (36.8 C) (07/01 0335) Temp Source: Axillary (06/30 2002) BP: 86/52 (07/01 0430) Pulse Rate: 122 (07/01 0430)  Labs: Recent Labs    01/21/24 0721 01/21/24 1353 01/22/24 0418 01/22/24 0959 01/22/24 1456 01/22/24 1457 01/22/24 2237 01/23/24 0317 01/23/24 1400 01/23/24 2030 01/24/24 0411  HGB  --   --   --    < >  --  8.5*  --  8.4*  --   --  8.2*  HCT  --   --   --   --   --  24.3*  --  24.1*  --   --  24.0*  PLT  --   --   --   --   --  87*  --  86*  --   --  80*  APTT  --    < >  --    < >  --  85*   < > 100* 99* 104* 93*  LABPROT  --   --  38.3*  --   --   --   --  46.7*  --   --  40.3*  INR  --   --  3.7*  --   --   --   --  4.8*  --   --  3.9*  CREATININE 3.34*  --   --   --  2.94*  --   --  3.42*  --   --   --    < > = values in this interval not displayed.    Estimated Creatinine Clearance: 46.5 mL/min (A) (by C-G formula based on SCr of 3.42 mg/dL (H)).  Assessment: 38 y.o. male with h/o Afib, Eliquis  on hold for CRRT, for bivalirudin  due to h/o HIT.   aPTT remains on higher end of goal range @ 99 sec on current rate of 0.007 mg/kg/hr (recently reduced from 0.008).  Next HD Tuesday.  7/1 AM update:  aPTT therapeutic after small rate decrease  Goal of Therapy:  aPTT: 80-100 sec Monitor platelets by anticoagulation protocol: Yes   Plan:  Cont bivalrudin at 0.005 mg/kg/hr. Re-check aPTT in 4 hrs.   Lynwood Mckusick, PharmD, BCPS Clinical Pharmacist Phone: [phone]

## 2024-01-24 NOTE — Progress Notes (Signed)
 PHARMACY - ANTICOAGULATION  Pharmacy Consult for bivalirudin    Indication: atrial fibrillation w/ hx of HIT   Patient Measurements: Height: 6' (182.9 cm) Weight: (!) 164.3 kg (362 lb 3.5 oz) IBW/kg (Calculated) : 77.6 HEPARIN  DW (KG): 125.9  Vital Signs: Temp: 98 F (36.7 C) (07/01 1518) Temp Source: Oral (07/01 1518) BP: 85/73 (07/01 1532) Pulse Rate: 114 (07/01 1532)  Labs: Recent Labs    01/22/24 0418 01/22/24 0959 01/22/24 1456 01/22/24 1457 01/23/24 0317 01/23/24 1400 01/24/24 0411 01/24/24 1306 01/24/24 1743  HGB  --   --   --    < > 8.4*  --  8.2* 7.7*  --   HCT  --   --   --    < > 24.1*  --  24.0* 22.3*  --   PLT  --   --   --    < > 86*  --  80* 81*  --   APTT  --    < >  --    < > 100*   < > 93* 98* 89*  LABPROT 38.3*  --   --   --  46.7*  --  40.3*  --   --   INR 3.7*  --   --   --  4.8*  --  3.9*  --   --   CREATININE  --   --  2.94*  --  3.42*  --  4.05*  --   --    < > = values in this interval not displayed.    Estimated Creatinine Clearance: 39.3 mL/min (A) (by C-G formula based on SCr of 4.05 mg/dL (H)).  Assessment: 38 y.o. male with h/o Afib, Eliquis  on hold for CRRT, for bivalirudin  due to h/o HIT.   aPTT remains on higher end of goal range @ 98 sec on current rate of 0.005 mg/kg/hr (1.1 ml/hr).  No HD today.  2nd shift update: aPTT 89, therapeutic.   Goal of Therapy:  aPTT: 80-100 sec Monitor platelets by anticoagulation protocol: Yes   Plan:  Cont bivalrudin at 0.005 mg/kg/hr (1.1 ml/hr) - unsure we could go any lower and still run via pump.  aPTT BID for now.   Rankin Sams, PharmD, BCPS, BCCCP Clinical Pharmacist

## 2024-01-24 NOTE — Progress Notes (Addendum)
  Per Dr. Claudene, titrate Levophed  to a MAP goal of 50.

## 2024-01-24 NOTE — Progress Notes (Addendum)
 Per Dr. Claudene, titrate Levophed  to a MAP goal of 55.

## 2024-01-24 NOTE — Progress Notes (Signed)
 NAME:  Phillip Young, MRN:  [id number], DOB:  March 30, 1986, LOS: 21 ADMISSION DATE:  01/03/2024, CONSULTATION DATE:  01/03/24 REFERRING MD:  Dr. Patsey, CHIEF COMPLAINT:  SOB/ chills   History of Present Illness:  3 yoM w/PMH as below significant for biventricular HF (EF < 20%), chronic afib on Eliquis , chronic hypotension on midodrine  30mg  TID, ESRD, chronic hypoxic and hypercarbic respiratory failure on NIV, morbid obesity, medical non-compliance, and chronic pain presenting from home after being discharged from [facility] 6/9 with SOB, chills, subjective fever, ongoing poor PO, vomiting today strings of blood but then reported it was with coughing episodes, pain all over, and increased weeping from left thigh blister.  Had BM, unclear on consistency/ color.  Reports last dialysis yesterday.  Initially reports not taking any PO meds today but then said he did this morning.  Non- ambulatory at baseline.  Frequent admits, most recently 5/13-5/22 for recurrent septic shock and MRSE bacteremia due to suspected line complicated by acute on chronic HF and respiratory failure, suspected IE but not a candidate for TEE, discharged on vancomycin  with HD scheduled thru 6/30.  TDC was replaced 5/22 after 2 day line holiday.  Also started on micafungin  w/HD at [facility] after [facility] grew candida species, scheduled thru 6/30.   In ER, temp 99.1, on HFNC at 6L, with SBP into 70's requiring NE support after 500 ml bolus.  Labs significant for Na 129, K 6.3, Cl 89, alk phos 346, AST 84, t.bili 12 (recent 3.4 on 5/14), lactic 3.8, WBC 20.6 (previously 8.1 on 5/22), INR 2.9, CXR showing vascular congestion and increasing congestion and pleural effusions.  BC sent and empirically started on linezolid , flagyl , and cefepime .  Pending CT chest/abd/ pelvis.  Nephrology consulted.  PCCM called for admit.   Pertinent  Medical History   Past Medical History:  Diagnosis Date   Acute on chronic respiratory failure with hypoxia ([name])  04/21/2021   Acute on chronic systolic (congestive) heart failure ([name]) 02/26/2020   Amiodarone  toxicity    Anemia    Biventricular congestive heart failure ([name])    Candida parapsilosis infection 12/26/2023   Chronic hypoxemic respiratory failure ([name])    Class 3 severe obesity due to excess calories with serious comorbidity and body mass index (BMI) of 50.0 to 59.9 in adult 02/26/2020   ESRD on hemodialysis [facility])    Essential hypertension 02/26/2020   GERD without esophagitis 02/26/2020   Hidradenitis suppurativa 02/26/2020   NICM (nonischemic cardiomyopathy) ([name])    Obesity hypoventilation syndrome ([name])    OSA (obstructive sleep apnea)    Pneumonia    Prediabetes 02/26/2020   Significant Hospital Events: Including procedures, antibiotic start and stop dates in addition to other pertinent events   6/10 Admit 6/12 Remains on vasopressors.  Spoke to heart failure.  Not a candidate for advanced therapies.  Not a candidate for tailored hemodynamics.  Recommend a strategy of vasopressor support at fluid removal in an attempt to transition back to IHD. 6/15 patient brought up discussion of limiting some of his care; Palliative re-consulted.  6/16 palliative consulted per patient's request. CT unrevealing for cause of abdominal pain. 6/23 nephro decided to restart CRRT despite prior nephro stopping. Remains hypotensive, mother did not consent central line, PICC delayed, peripheral pressors and resume CRRT, developed ileus vs SBO, NG placed to [facility] 6/24 PICC could not be placed, mother refused central line again 6/25 central line placed 6/26 CRRT stopped 6/27 on low dose levophed  6/28 on levophed  3-59mcg/min, tolerated iHD  6/29 increasing levophed  requirements 6/30 remained on levophed  above , arterial line declined  Interim History / Subjective:   No acute events overnight Updated mother at beside Patient and mother have agreed to have arterial line placed  Objective     Blood pressure (!) 83/68, pulse (!) 122, temperature 97.9 F (36.6 C), temperature source Axillary, resp. rate (!) 29, height 6' (1.829 m), weight (!) 164.3 kg, SpO2 98%.        Intake/Output Summary (Last 24 hours) at 01/24/2024 9041 Last data filed at 01/24/2024 0700 Gross per 24 hour  Intake 742.07 ml  Output --  Net 742.07 ml   Filed Weights   01/19/24 0500 01/20/24 0600 01/21/24 0400  Weight: (!) 162.6 kg (!) 163 kg (!) 164.3 kg   Examination: Chronically ill appearing obese man, laying in bed, in moderate discomfort On nasal cannula Breath and lung sounds diminished, No wheeze RRR Abdomen obese, soft, non-tender Non pitting peripheral edema vs lymphedema Faint breathy and soft voice, very weak, unable to sit up independently, alert follows commands and conversant   Resolved problem list:    Assessment and Plan  Shock ([name])> suspect primarily cardiogenic Continue vasopressor support with Levophed , MAP goal 55 or SBP 90 IV vancomycin  to treat cellulitis end 6/30 Start Meropenem  for empiric coverage today Not a candidate for advanced heart failure therapy, reached out to heartfailure team today - no further recommendations Central line placed 6/25, cap pressors at NE max dose 40 Continue midodrine   Arterial Line to be placed today Trend lactic acid and Coox as levo is weaned  Leukocytosis: Rising WBC, pressors up and down, no fever, unable to remove central line with ongoing pressor need and possible iHD.  CTM Vanc course end 6/30 Continue empiric meropenem  Start empiric linezolid  for hx of VRE Trend procalcitonin  Hyperbilirubinemia-- POA and present 5/25 now worsened, congestive vs low forward flow state. 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 No  signs of obstruction.  Acaculous cholecystitis is possible> no suggestion of this on any of his imaging. Patent hepatic portal veins so no Budd Chiari. Low suspicion for DILI with normal transaminases and no obvious culprit.  Continue trial of ursodiol , bili seems trending down mildly, stable ~19  Acute on chronic biventricular congestive heart failure ([name])-combined sysolic & diastolic; known NICM. Echo shows severe RV and LV failure with EF <20%. Non-compliant for many years. Per advanced heart failure, patient is not a candidate for advanced therapy Unable to tolerate GDMT in the setting of hypotension CRRT was stopped, still requiring vasopressor support, would not tolerate IHD, CRRT resumed time limited trial 6/23 for 3 days, ended AM 6/25 Unable to do iHD today due to pressor requirements If he is unable to wean off pressors, hospice care will be the only reasonable option.  Palliative care reconsulted and now following peripherally.  ESRD on hemodialysis CRRT was stopped, discussed with nephro utility to resume, time limited trial again after discussion 6/23 and ended 6/26 He had iHD 6/29 AM Discussed with nephrology this morning, if he remains on high dose norepinephrine  he is not a candidate for iHD.  Constipation SBO vs Ileus on plain film 6/23 (resolved) Had BM 6/21. Continue to offer bowel regimen  Obesity hypoventilation syndrome - Intermittently compliant with home BIPAP. Recommend at bedtime or when sleeping  otherwise he use bipap; patient intermittently declining   Obesity, BMI 49 Moderate Protein Calorie Malnutrition - feeding supplement as tolerated - nutrition evaluation  Permanent atrial fibrillation  Amiodarone  intolerant Heart rate is controlled Bival stopped with bleeding wounds 6/24, resume 6/25 ad hgb stable and no further bleeding Due to hyperbilirubinemia, can not switch him to Eliquis   Digoxin  repeat dose 6/29 - will hold addl doses as unlikely will  tolerate iHD  Chronic pain disorder Continue pregabalin  Dilaudid  PRN  Mild hyperammonemia Lactulose  BID; he has been refusing  Skin ulceration; recent cellulitis  Has chronic skin ulceration, and he routinely has been refusing dressing changes. He understands he will not be able to make progress if we cannot keep his wounds clean. On Vanc- dosing per pharmacy - to end 6/30  Long term prognosis remains grim. No good options if he is not able to tolerate iHD. Appreciate Palliative care team's management. Most likely outcome is in hospital death. GOC remain full code yet at times refuse appropriate and aggressive medical care as recommended. Not amenable currently to discussion about imminent death.    Best Practice (right click and Reselect all SmartList Selections daily)   Diet/type: Regular consistency (see orders) DVT prophylaxis SCD, bival gtt Pressure ulcer(s): present on admission  GI prophylaxis: PPI Lines: Dialysis Catheter-pta R TDC (12/15/23) Foley:  N/A Code Status:  full code Last date of multidisciplinary goals of care discussion - discussed with mom bedside 6/29, discussed with CMO Gordy Pina, MD 6/23.   CRITICAL CARE Performed by: Dorn KATHEE Chill   Total critical care time: 35 minutes  Critical care time was exclusive of separately billable procedures and treating other patients.  Critical care was necessary to treat or prevent imminent or life-threatening deterioration.  Critical care was time spent personally by me on the following activities: development of treatment plan with patient and/or surrogate as well as nursing, discussions with consultants, evaluation of patient's response to treatment, examination of patient, obtaining history from patient or surrogate, ordering and performing treatments and interventions, ordering and review of laboratory studies, ordering and review of radiographic studies, pulse oximetry and re-evaluation of patient's  condition.      Dorn Chill, MD [facility] Office: [phone]   See Amion for personal pager PCCM on call pager [phone] until 7pm. Please call Elink 7p-7a. [phone]

## 2024-01-24 DEATH — deceased

## 2024-01-25 DIAGNOSIS — D72829 Elevated white blood cell count, unspecified: Secondary | ICD-10-CM | POA: Diagnosis not present

## 2024-01-25 DIAGNOSIS — I5082 Biventricular heart failure: Secondary | ICD-10-CM | POA: Diagnosis not present

## 2024-01-25 DIAGNOSIS — R579 Shock, unspecified: Secondary | ICD-10-CM | POA: Diagnosis not present

## 2024-01-25 LAB — COMPREHENSIVE METABOLIC PANEL WITH GFR
ALT: 28 U/L (ref 0–44)
AST: 35 U/L (ref 15–41)
Albumin: 1.8 g/dL — ABNORMAL LOW (ref 3.5–5.0)
Alkaline Phosphatase: 182 U/L — ABNORMAL HIGH (ref 38–126)
Anion gap: 27 — ABNORMAL HIGH (ref 5–15)
BUN: 71 mg/dL — ABNORMAL HIGH (ref 6–20)
CO2: 15 mmol/L — ABNORMAL LOW (ref 22–32)
Calcium: 8.2 mg/dL — ABNORMAL LOW (ref 8.9–10.3)
Chloride: 90 mmol/L — ABNORMAL LOW (ref 98–111)
Creatinine, Ser: 4.58 mg/dL — ABNORMAL HIGH (ref 0.61–1.24)
GFR, Estimated: 16 mL/min — ABNORMAL LOW (ref >60)
Glucose, Bld: 56 mg/dL — ABNORMAL LOW (ref 70–99)
Potassium: 5.3 mmol/L — ABNORMAL HIGH (ref 3.5–5.1)
Sodium: 132 mmol/L — ABNORMAL LOW (ref 135–145)
Total Bilirubin: 22.3 mg/dL (ref 0.0–1.2)
Total Protein: 5.2 g/dL — ABNORMAL LOW (ref 6.5–8.1)

## 2024-01-25 LAB — PROTIME-INR
INR: 3.9 — ABNORMAL HIGH (ref 0.8–1.2)
Prothrombin Time: 40 s — ABNORMAL HIGH (ref 11.4–15.2)

## 2024-01-25 LAB — CBC
HCT: 20.3 % — ABNORMAL LOW (ref 39.0–52.0)
Hemoglobin: 7 g/dL — ABNORMAL LOW (ref 13.0–17.0)
MCH: 29 pg (ref 26.0–34.0)
MCHC: 34.5 g/dL (ref 30.0–36.0)
MCV: 84.2 fL (ref 80.0–100.0)
Platelets: 84 10*3/uL — ABNORMAL LOW (ref 150–400)
RBC: 2.41 MIL/uL — ABNORMAL LOW (ref 4.22–5.81)
RDW: 21.5 % — ABNORMAL HIGH (ref 11.5–15.5)
WBC: 38.9 10*3/uL — ABNORMAL HIGH (ref 4.0–10.5)
nRBC: 0.2 % (ref 0.0–0.2)

## 2024-01-25 LAB — APTT
aPTT: 99 s — ABNORMAL HIGH (ref 24–36)
aPTT: 99 s — ABNORMAL HIGH (ref 24–36)

## 2024-01-25 LAB — COOXEMETRY PANEL
Carboxyhemoglobin: 2.4 % — ABNORMAL HIGH (ref 0.5–1.5)
Methemoglobin: <0.7 % (ref 0.0–1.5)
O2 Saturation: 67.8 %
Total hemoglobin: 7.4 g/dL — ABNORMAL LOW (ref 12.0–16.0)

## 2024-01-25 LAB — CORTISOL: Cortisol, Plasma: 34.9 ug/dL

## 2024-01-25 LAB — PROCALCITONIN: Procalcitonin: 32.16 ng/mL

## 2024-01-25 LAB — LACTIC ACID, PLASMA
Lactic Acid, Venous: 2.6 mmol/L (ref 0.5–1.9)
Lactic Acid, Venous: 2.8 mmol/L (ref 0.5–1.9)

## 2024-01-25 MED ORDER — ORAL CARE MOUTH RINSE
15.0000 mL | OROMUCOSAL | Status: DC
  Administered 2024-01-25 – 2024-01-26 (×8): 15 mL via OROMUCOSAL

## 2024-01-25 MED ORDER — SODIUM ZIRCONIUM CYCLOSILICATE 10 G PO PACK
10.0000 g | PACK | Freq: Once | ORAL | Status: AC
  Administered 2024-01-25: 10 g
  Filled 2024-01-25: qty 1

## 2024-01-25 MED ORDER — RENA-VITE PO TABS: 1.0000 | ORAL_TABLET | Freq: Every day | ORAL | Status: DC

## 2024-01-25 MED ORDER — URSODIOL 300 MG PO CAPS
300.0000 mg | ORAL_CAPSULE | Freq: Two times a day (BID) | ORAL | Status: DC
  Administered 2024-01-26 – 2024-01-28 (×6): 300 mg
  Filled 2024-01-25 (×6): qty 1

## 2024-01-25 MED ORDER — MIDODRINE HCL 5 MG PO TABS
40.0000 mg | ORAL_TABLET | Freq: Three times a day (TID) | ORAL | Status: DC
  Administered 2024-01-26 – 2024-01-28 (×7): 40 mg
  Filled 2024-01-25 (×7): qty 8

## 2024-01-25 MED ORDER — SODIUM CHLORIDE 0.9 % IV SOLN
1.0000 g | Freq: Two times a day (BID) | INTRAVENOUS | Status: DC
  Administered 2024-01-25 – 2024-01-28 (×7): 1 g via INTRAVENOUS
  Filled 2024-01-25 (×7): qty 20

## 2024-01-25 MED ORDER — SODIUM BICARBONATE 650 MG PO TABS: 650.0000 mg | ORAL_TABLET | Freq: Three times a day (TID) | ORAL | Status: DC

## 2024-01-25 MED ORDER — HYDROCORTISONE SOD SUC (PF) 100 MG IJ SOLR
100.0000 mg | Freq: Three times a day (TID) | INTRAMUSCULAR | Status: DC
  Administered 2024-01-25: 100 mg via INTRAVENOUS
  Filled 2024-01-25: qty 2

## 2024-01-25 MED ORDER — SODIUM BICARBONATE 8.4 % IV SOLN
50.0000 meq | Freq: Once | INTRAVENOUS | Status: AC
  Administered 2024-01-25: 50 meq via INTRAVENOUS
  Filled 2024-01-25: qty 50

## 2024-01-25 MED ORDER — SODIUM CHLORIDE 0.9% IV SOLUTION: Freq: Once | INTRAVENOUS | Status: DC

## 2024-01-25 MED ORDER — ALBUMIN HUMAN 25 % IV SOLN
25.0000 g | Freq: Four times a day (QID) | INTRAVENOUS | Status: AC
  Administered 2024-01-25 – 2024-01-26 (×4): 25 g via INTRAVENOUS
  Filled 2024-01-25 (×4): qty 100

## 2024-01-25 MED ORDER — VITAL HP 1.0 CAL PO LIQD
1000.0000 mL | ORAL | Status: DC
  Administered 2024-01-26: 1000 mL

## 2024-01-25 MED ORDER — STERILE WATER FOR INJECTION IJ SOLN
INTRAMUSCULAR | Status: AC
  Administered 2024-01-25: 10 mL
  Filled 2024-01-25: qty 10

## 2024-01-25 NOTE — Progress Notes (Addendum)
 0730 patient alert responds to name and states hospital when asked where we are response very delayed patient doesn't answer any time or situation  will on head at times to yes and no questions. Patient not swallowing juice given with a spoon 2 attempts made 15 mins apart had to use suction to remove apple juice to prevent aspiration. Patient now NPO Patient mother asked for HD Dr to come in and discuss HD she wanted patient to have HD because he has not had it since 01/22/24 manager spoke with patient mother and CC team did also. Nephro came and seen patient explained plan of care and why.  1130 while obtaining type and screen patients mother states she doesn't want blood at all only dialysis.

## 2024-01-25 NOTE — Progress Notes (Addendum)
 1949: Patients mother refused tube feeding and states she will reassess whether she wants it tomorrow morning. Also states that levophed  drip is not to be increased above no matter what. Attempted to discuss purpose of levophed  and why we may need to increase it depending on his blood pressures in order for him to adequately perfuse. She repeated that it is not to be increased over .  2042: Patient tachycardic to 130s, tachypneic RR 20-32, and seems generally uncomfortable. He is restless and withdrawn, unable to answer questions or nod/gesture appropriately. Pain medication offered but his mother refused, stating he will tell me/us  if he wants it. Discussed how vital signs may be indicative of pain since patient is currently unable to communicate his feelings/desires. She stated I am in control tonight so you can let everybody know that  2104: [facility] RN notified with the following message: '[name] is very concerned about swelling in his RUE/R hand specifically. Wants charge nurse and/or MD to look at it and possibly us  remove the arterial line. She wanted me to down titrate his levo. He was at and I went to since his SBP was 80s on arterial line, and per new levo order SBP goal is 68. Arterial line SBP now high 50's/low 60's but cuff pressure is 97/49 (63). He is restless and wont stop moving RUE where arterial line is so it's hard to trust it. He is tachycardic 105-130's, RR is 20-40. She refused pain meds. Are we ok going by cuff pressures?'  -  Attempted to explain that minor bleeding from arterial line site may be from his being restless and unknowingly pulling at/irritating insertion site. She states it is infected, although no signs of infection present. She refused arterial line dressing change until charge RN looked at it. Jess RN (Press photographer) at bedside to evaluate and reiterated what I had already attempted to explain. RUE elevated to help with swelling. Arterial  line dressing reinforced.   - Discussed with Paliwal MD via phone call - per Paliwal MD, OK to go by cuff pressures if SBP is met, even if arterial line pressure is below goal. Also discussed patients mothers significant obstruction of care. Paliwal MD and team well aware and following closely.  2330: wound care completed to all wounds per orders along with CHG bath. Patients mother took pictures with her phone of patient, she was advised by charge nurse that we (staff) are not to be in any photos or videos. She became aggressive and defensive stating she was not taking any pictures of staff, just of her son's wounds to document as she is trying to get him transferred to burn unit at [facility].  0330: told by patients mother that I am the only nurse in this hospital who wants to keep him on levophed , everyone else has worked so hard to get it down/off and you erase all of the progress. Attempted to educate her again on the necessity of levophed  infusion in order for adequate perfusion of his organs. Levophed  dose has stayed at 2-37mcg all shift, meeting/exceeding SBP goal of 68.   0630: [facility] RN notified of worsening respiratory status, increased work of breathing and inability to cough/manage secretions

## 2024-01-25 NOTE — Progress Notes (Incomplete Revision)
 1949: Patients mother refused tube feeding and states she will reassess whether she wants it tomorrow morning  2042: Patient tachycardic to 130s, tachypneic RR 20-32, and seems generally uncomfortable. He is restless and withdrawn, unable to answer questions or nod/gesture appropriately. Pain medication offered but his mother refused, stating he will tell me/us  if he wants it. Discussed how vital signs may be indicative of pain since patient is currently unable to communicate his feelings/desires. She stated I am in control tonight so you can let everybody know that  2104: [facility] RN notified with the following message: '[name] is very concerned about swelling in his RUE/R hand specifically. Wants charge nurse and/or MD to look at it and possibly us  remove the A line. She wanted me to down titrate his levo. He was at and I went to since his SBP was 80s on A line, and per new levo order SBP goal is 68. A line SBP now high 50's/low 60's but cuff is 97/49 (63). He is restless and wont stop moving RUE where A line is so it's hard to trust it. He is tachycardic 105-130's, RR is 20-40. She refused pain meds. Are we ok going by cuff pressures?' Attempted to explain necessity of levophed  infusion for adequate BP/perfusion with patients mother. As well as explaining that minor bleeding from A line site may be from his being restless and unknowingly pulling at/irritating insertion site. She states it is infected, although no signs of infection present. She refused A line dressing change until charge RN looked at it. Jess RN (Press photographer) at bedside to evaluate and reiterated what I had already attempted to explain. RUE elevated to help with swelling.   - Discussed with Paliwal MD via phone call - per Paliwal MD, OK to go by cuff pressures if SBP is met, even if A line pressure is below goal. Also discussed patients mothers significant obstruction of care. Paliwal MD and team well aware and  following closely.

## 2024-01-25 NOTE — Progress Notes (Signed)
 [name] mother does not want a bath while pt is receiving iHD.   Also refuses to take steroids... Notified Dr. Claudene  Sts she does not want TF at the moment and will address this as the days go... notified MD

## 2024-01-25 NOTE — Procedures (Addendum)
 Cortrak  Person Inserting Tube:  Rosabel Rollo PARAS, RD Tube Type:  Cortrak - 43 inches Tube Size:  10 Tube Location:  Left nare Initial Placement:  Stomach Secured by: Bridle Technique Used to Measure Tube Placement:  Marking at nare/corner of mouth Cortrak Secured At:  73 cm   Cortrak Tube Team Note:  Consult received to place a Cortrak feeding tube. INR > 2.0. Discussed with MD, okay to proceed with both Cortrak and bridle per Dr. Kara.  No x-ray is required. RN may begin using tube.   If the tube becomes dislodged please keep the tube and contact the Cortrak team at [email or web address] for replacement.  If after hours and replacement cannot be delayed, place a NG tube and confirm placement with an abdominal x-ray.    Mallie Rosabel, MS, RD, LDN Registered Dietitian II Please see AMiON for contact information.

## 2024-01-25 NOTE — Plan of Care (Signed)

## 2024-01-25 NOTE — Plan of Care (Signed)
 Pt observed pulling at wound dressings, education given, dressings reinforced throughout shift. 0500 Collected materials to do wound care where needed. Coordinator at bedside, instructed RN to allow pt to rest. Pass on to day team to try to coordinate complete skin care at 1300.  Problem: Clinical Measurements: Goal: Ability to maintain clinical measurements within normal limits will improve Outcome: Progressing Goal: Will remain free from infection Outcome: Progressing Goal: Diagnostic test results will improve Outcome: Progressing Goal: Respiratory complications will improve Outcome: Progressing

## 2024-01-25 NOTE — Progress Notes (Signed)
 NAME:  Phillip Young, MRN:  [id number], DOB:  Nov 13, 1985, LOS: 22 ADMISSION DATE:  01/03/2024, CONSULTATION DATE:  01/03/24 REFERRING MD:  Dr. Patsey, CHIEF COMPLAINT:  SOB/ chills   History of Present Illness:  42 yoM w/PMH as below significant for biventricular HF (EF < 20%), chronic afib on Eliquis , chronic hypotension on midodrine  30mg  TID, ESRD, chronic hypoxic and hypercarbic respiratory failure on NIV, morbid obesity, medical non-compliance, and chronic pain presenting from home after being discharged from [facility] 6/9 with SOB, chills, subjective fever, ongoing poor PO, vomiting today strings of blood but then reported it was with coughing episodes, pain all over, and increased weeping from left thigh blister.  Had BM, unclear on consistency/ color.  Reports last dialysis yesterday.  Initially reports not taking any PO meds today but then said he did this morning.  Non- ambulatory at baseline.  Frequent admits, most recently 5/13-5/22 for recurrent septic shock and MRSE bacteremia due to suspected line complicated by acute on chronic HF and respiratory failure, suspected IE but not a candidate for TEE, discharged on vancomycin  with HD scheduled thru 6/30.  TDC was replaced 5/22 after 2 day line holiday.  Also started on micafungin  w/HD at [facility] after [facility] grew candida species, scheduled thru 6/30.   In ER, temp 99.1, on HFNC at 6L, with SBP into 70's requiring NE support after 500 ml bolus.  Labs significant for Na 129, K 6.3, Cl 89, alk phos 346, AST 84, t.bili 12 (recent 3.4 on 5/14), lactic 3.8, WBC 20.6 (previously 8.1 on 5/22), INR 2.9, CXR showing vascular congestion and increasing congestion and pleural effusions.  BC sent and empirically started on linezolid , flagyl , and cefepime .  Pending CT chest/abd/ pelvis.  Nephrology consulted.  PCCM called for admit.   Pertinent  Medical History   Past Medical History:  Diagnosis Date   Acute on chronic respiratory failure with hypoxia ([name])  04/21/2021   Acute on chronic systolic (congestive) heart failure ([name]) 02/26/2020   Amiodarone  toxicity    Anemia    Biventricular congestive heart failure ([name])    Candida parapsilosis infection 12/26/2023   Chronic hypoxemic respiratory failure ([name])    Class 3 severe obesity due to excess calories with serious comorbidity and body mass index (BMI) of 50.0 to 59.9 in adult 02/26/2020   ESRD on hemodialysis [facility])    Essential hypertension 02/26/2020   GERD without esophagitis 02/26/2020   Hidradenitis suppurativa 02/26/2020   NICM (nonischemic cardiomyopathy) ([name])    Obesity hypoventilation syndrome ([name])    OSA (obstructive sleep apnea)    Pneumonia    Prediabetes 02/26/2020   Significant Hospital Events: Including procedures, antibiotic start and stop dates in addition to other pertinent events   6/10 Admit 6/12 Remains on vasopressors.  Spoke to heart failure.  Not a candidate for advanced therapies.  Not a candidate for tailored hemodynamics.  Recommend a strategy of vasopressor support at fluid removal in an attempt to transition back to IHD. 6/15 patient brought up discussion of limiting some of his care; Palliative re-consulted.  6/16 palliative consulted per patient's request. CT unrevealing for cause of abdominal pain. 6/23 nephro decided to restart CRRT despite prior nephro stopping. Remains hypotensive, mother did not consent central line, PICC delayed, peripheral pressors and resume CRRT, developed ileus vs SBO, NG placed to [facility] 6/24 PICC could not be placed, mother refused central line again 6/25 central line placed 6/26 CRRT stopped 6/27 on low dose levophed  6/28 on levophed  3-63mcg/min, tolerated iHD  6/29 increasing levophed  requirements 6/30 remained on levophed  above , arterial line declined 6/31 remained on levophed , not able to do iHD due to elevated pressor requirements  Interim History / Subjective:   No acute events overnight Updated mother at  beside Discussed need for blood transfusion and cortrak now that patient is more altered and not taking oral medications.  Objective    Blood pressure (!) 79/53, pulse (!) 116, temperature 97.8 F (36.6 C), temperature source Oral, resp. rate (!) 24, height 6' (1.829 m), weight (!) 164.3 kg, SpO2 96%.        Intake/Output Summary (Last 24 hours) at 01/25/2024 0743 Last data filed at 01/25/2024 0700 Gross per 24 hour  Intake 1189.75 ml  Output 1 ml  Net 1188.75 ml   Filed Weights   01/19/24 0500 01/20/24 0600 01/21/24 0400  Weight: (!) 162.6 kg (!) 163 kg (!) 164.3 kg   Examination: Chronically ill appearing obese man, laying in bed On nasal cannula Breath and lung sounds diminished, No wheeze RRR Abdomen obese, soft, non-tender Non pitting peripheral edema Awake, moving all extremities, intermittently following commands   Resolved problem list:    Assessment and Plan  Shock ([name])> suspect primarily cardiogenic Continue vasopressor support with Levophed , MAP goal 45 or greater Meropenem  and linezolid  for empiric coverage, procalcicontin is elevated - will trend Not a candidate for advanced heart failure therapy, reached out to heartfailure team 7/1 - no further recommendations Central line placed 6/25, arterial line placed 7/1 Continue midodrine   Trend lactic acid and Coox as levo is weaned  Acute Metabolic Encephalopathy Uremia vs shock vs hepatic  Goal to try to get dialysis done Place cortrak due to altered mentation  Leukocytosis: Rising WBC, pressors up and down, no fever, unable to remove central line with ongoing pressor need and possible iHD.  CTM Vanc course end 6/30 Continue empiric meropenem /linezolid  Trend procalcitonin  Anemia Offered blood transfusion but refused by mother per patient's wishes  Hyperbilirubinemia-- POA and present 5/25 now worsened, congestive vs low forward flow state. 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 No signs of obstruction.  Acaculous cholecystitis is possible> no suggestion of this on any of his imaging. Patent hepatic portal veins so no Budd Chiari. Low suspicion for DILI with normal transaminases and no obvious culprit.  Continue trial of ursodiol , bili seems trending down mildly, stable ~19  Acute on chronic biventricular congestive heart failure ([name])-combined sysolic & diastolic; known NICM. Echo shows severe RV and LV failure with EF <20%. Non-compliant for many years. Per advanced heart failure, patient is not a candidate for advanced therapy Unable to tolerate GDMT in the setting of hypotension CRRT was stopped, still requiring vasopressor support, would not tolerate IHD, CRRT resumed time limited trial 6/23 for 3 days, ended AM 6/25 Unable to do iHD today due to pressor requirements, goal levophed  at 5mcg  If he is unable to wean off pressors, hospice care will be the only reasonable option.  Palliative care following peripherally.  ESRD on hemodialysis CRRT was stopped, discussed with nephro utility to resume, time limited trial again after discussion 6/23 and ended 6/26 He had iHD 6/29 AM Discussed with nephrology this morning, if he remains on high dose norepinephrine  he is not a candidate for iHD.  Constipation  SBO vs Ileus on  plain film 6/23 (resolved) Had BM 7/1. Continue to offer bowel regimen  Obesity hypoventilation syndrome - Intermittently compliant with home BIPAP. Recommend at bedtime or when sleeping otherwise he use bipap; patient intermittently declining   Obesity, BMI 49 Moderate Protein Calorie Malnutrition - feeding supplement as tolerated - nutrition evaluation - place cortrak today due to mental status  Permanent atrial fibrillation  Amiodarone   intolerant Heart rate is controlled Bival stopped with bleeding wounds 6/24, resume 6/25 ad hgb stable and no further bleeding Due to hyperbilirubinemia, can not switch him to Eliquis   Digoxin  repeat dose 6/29 - will hold addl doses as unlikely will tolerate iHD  Chronic pain disorder Continue pregabalin  Dilaudid  PRN  Mild hyperammonemia Lactulose  BID; he has been refusing  Skin ulceration; recent cellulitis  Has chronic skin ulceration, and he routinely has been refusing dressing changes. He understands he will not be able to make progress if we cannot keep his wounds clean. On Vanc- dosing per pharmacy - to end 6/30  Long term prognosis remains grim. No good options if he is not able to tolerate iHD. Appreciate Palliative care team's management. Most likely outcome is in hospital death. GOC remain full code yet at times refuse appropriate and aggressive medical care as recommended.    Best Practice (right click and Reselect all SmartList Selections daily)   Diet/type: tubefeeds DVT prophylaxis SCD, bival gtt Pressure ulcer(s): present on admission  GI prophylaxis: PPI Lines: Dialysis Catheter-pta R TDC (12/15/23) Foley:  N/A Code Status:  full code Last date of multidisciplinary goals of care discussion - updated mother at bedside today  CRITICAL CARE Performed by: Dorn KATHEE Chill   Total critical care time: 45 minutes  Critical care time was exclusive of separately billable procedures and treating other patients.  Critical care was necessary to treat or prevent imminent or life-threatening deterioration.  Critical care was time spent personally by me on the following activities: development of treatment plan with patient and/or surrogate as well as nursing, discussions with consultants, evaluation of patient's response to treatment, examination of patient, obtaining history from patient or surrogate, ordering and performing treatments and interventions, ordering and  review of laboratory studies, ordering and review of radiographic studies, pulse oximetry and re-evaluation of patient's condition.      Dorn Chill, MD [facility] Office: [phone]   See Amion for personal pager PCCM on call pager [phone] until 7pm. Please call Elink 7p-7a. [phone]

## 2024-01-25 NOTE — Progress Notes (Addendum)
 [facility] NEPHROLOGY PROGRESS NOTE  Summary: Pt is a 38 y.o. yo male with past medical history of biventricular heart failure EF less than 20, chronic A-fib, ESRD on HD, chronic hypotension on high-dose midodrine , nonadherence with outpatient HD and treatment, recently at [facility] presented with shock and volume overload.  Subjective:   Remains on high dose of norepi. Patient not interactive this morning.  Discussed care with the patient's mother at bedside.  We discussed that he does need renal replacement therapy but unable to tolerate hemodialysis at this time.  We continue to agree on attempting dialysis once he is on 5 MCG of norepinephrine .  We also continued to discuss that his overall medical trajectory seems to be pointing towards a gram prognosis.  Objective Vital signs in last 24 hours: Vitals:   01/25/24 0845 01/25/24 0900 01/25/24 0915 01/25/24 0930  BP:      Pulse: (!) 110 (!) 137 (!) 129 (!) 109  Resp: (!) 24 (!) 22 (!) 24 (!) 21  Temp:      TempSrc:      SpO2: 95% 95% 94% 96%  Weight:      Height:       Physical Exam: General: lying in bed, nad Heart: Tachycardic, no rub Lungs: Bilateral distant breath sound, mild iwob Abdomen:soft, mildly distended, obese Extremities: 1+ bilat LE edema, chronic venous stasis changes with some skin sloughing of the lower extremities Dialysis Access: TDC  OP HD: MWF [facility] 4.5h   B400  180kg  TDC  NO heparin  (h/o HIT) Last OP HD 5/09, post wt 190kg  Assessment/ Plan # Shock, cardiogenic:  12/06/23 EF < 20%. Sepsis less likely with neg cultures, abx off. Not a candidate for advanced therapies. Likely at end stage of life  # ESRD:  on HD since 01/2021. Usual HD at [facility]. Noncompliant with outpatient HD with chronic hyperkalemia.  Was on CRRT 6/10- 6/19, then was dc'd. He remained too unstable for iHD. Discussed EOL issues 6/23 w/ pt's mother and she agreed for a 3 day trial of CRRT which ended 6/26. Plan was for iHD as  tolerated, no further CRRT. Tolerated iHD in ICU  on 6/28 w/ not removing any fluid. Plan was for HD 7/1 but on too high a dose of norepi.   There are no guidelines or definitive recommendations on norepinephrine  and hemodialysis.  However, generally we try to preserve dialysis for patients on lower doses of norepinephrine .  When on higher doses they do not tolerate dialysis very well and also have a higher risk of cardiac arrhythmia.  Doing slow hemodialysis on higher doses of norepinephrine  would essentially be the same thing is CRRT which we have decided to not continue.  The patient's mother and I as well as the other teams continued to seem in agreement regarding no dialysis until the patient is on norepinephrine  of 5 MCG or less.  However if we are not able to achieve significant ultrafiltration and clearance with dialysis over time progressing to the point where he can tolerate a full dialysis session without the assistance of norepinephrine  we will have reached a state of futility.  Because of his lack of progress over all and complicated factors I think an argument of fertility could already be made.  I appreciate the insight of the ethics team to help guide these conversations.  # Anemia of ESRD: cont weekly darbe sq 150 mcg . Transfuse as needed.some dilutional component  # Chronic hypotension/volume: on midodrine  40mg  tid,  norepinephrine  per primary team.  Targeting lower medical  # a fib: HR has been running in the 110s-130s  #HIT: avoiding heparin   #Elevated Tbili: with synthetic dysfunction  # Possible calciphylaxis: bilat anterior thighs w/ sig areas of skin hardening + darkening w/ associated sloughing. Potentially this is calciphylaxis. Avoiding Ca/ vit D products. Started on IV sodium thiosulfate  tiw. However with worsening acidosis and overall picture will hold his Na thiosulfate    01/25/2024, 11:22 AM  Recent Labs  Lab 01/20/24 0416 01/21/24 0352 01/24/24 0411  01/24/24 1306 01/25/24 0500  HGB  --    < > 8.2* 7.7* 7.0*  ALBUMIN  <1.5*   < > <1.5*  --  1.8*  CALCIUM  8.7*   < > 8.3*  --  8.2*  PHOS 3.0  --  5.7*  --   --   CREATININE 2.32*   < > 4.05*  --  4.58*  K 4.1   < > 5.4*  --  5.3*   < > = values in this interval not displayed.    Inpatient medications:  sodium chloride    Intravenous Once   sodium chloride    Intravenous Once   Chlorhexidine  Gluconate Cloth  6 each Topical Daily   Darbepoetin Alfa   150 mcg Subcutaneous Q7 days   feeding supplement (NEPRO CARB STEADY)  237 mL Oral TID BM   hydrocortisone  sod succinate (SOLU-CORTEF ) inj  100 mg Intravenous Q8H   leptospermum manuka honey  1 Application Topical Daily   liver oil-zinc  oxide   Topical BID   midodrine   40 mg Oral TID WC   multivitamin  1 tablet Oral QHS   mouth rinse  15 mL Mouth Rinse 4 times per day   mouth rinse  15 mL Mouth Rinse 4 times per day   pantoprazole  (PROTONIX ) IV  40 mg Intravenous Q12H   sodium bicarbonate   650 mg Oral TID   sodium zirconium cyclosilicate   10 g Per Tube Once   ursodiol   300 mg Oral BID    albumin  human 60 mL/hr at 01/25/24 1100   anticoagulant sodium citrate      anticoagulant sodium citrate      anticoagulant sodium citrate      bivalirudin  (ANGIOMAX ) 250 mg in sodium chloride  0.9 % 500 mL (0.5 mg/mL) infusion 0.005 mg/kg/hr (01/25/24 1100)   linezolid  (ZYVOX ) IV Stopped (01/24/24 2321)   meropenem  (MERREM ) IV Stopped (01/25/24 1044)   norepinephrine  (LEVOPHED ) Adult infusion 7 mcg/min (01/25/24 1118)   alteplase , anticoagulant sodium citrate , anticoagulant sodium citrate , camphor-menthol , HYDROmorphone  (DILAUDID ) injection, lidocaine  (PF), lidocaine -prilocaine , ondansetron  (ZOFRAN ) IV, mouth rinse, pentafluoroprop-tetrafluoroeth

## 2024-01-25 NOTE — Progress Notes (Signed)
   01/25/24 1800  Vitals  Temp (!) 97.4 F (36.3 C)  Pulse Rate (!) 114  Resp (!) 24  BP (!) 85/46  SpO2 94 %  Post Treatment  Dialyzer Clearance Other (Comment) (dialyzer yellow post rinseback)  Hemodialysis Intake (mL) 0 mL  Liters Processed 63  Fluid Removed (mL) 1000 mL  Tolerated HD Treatment Yes   Received patient in bed to unit.  Alert and oriented.  Informed consent signed and in chart.   TX duration:3.5hrs  Patient tolerated well.  Transported back to the room   without acute distress.  Hand-off given to patient's nurse.   Access used: [facility] Access issues: none  Total UF removed: 1L Medication(s) given: none    Na'Shaminy T [facility]

## 2024-01-25 NOTE — Progress Notes (Addendum)
 PHARMACY - ANTICOAGULATION  Pharmacy Consult for bivalirudin    Indication: atrial fibrillation w/ hx of HIT   Patient Measurements: Height: 6' (182.9 cm) Weight: (!) 164.3 kg (362 lb 3.5 oz) IBW/kg (Calculated) : 77.6 HEPARIN  DW (KG): 125.9  Vital Signs: Temp: 97.8 F (36.6 C) (07/02 0726) Temp Source: Oral (07/02 0726) Pulse Rate: 116 (07/02 0715)  Labs: Recent Labs    01/23/24 0317 01/23/24 1400 01/24/24 0411 01/24/24 1306 01/24/24 1743 01/25/24 0500  HGB 8.4*  --  8.2* 7.7*  --  7.0*  HCT 24.1*  --  24.0* 22.3*  --  20.3*  PLT 86*  --  80* 81*  --  84*  APTT 100*   < > 93* 98* 89* 99*  LABPROT 46.7*  --  40.3*  --   --  40.0*  INR 4.8*  --  3.9*  --   --  3.9*  CREATININE 3.42*  --  4.05*  --   --  4.58*   < > = values in this interval not displayed.    Estimated Creatinine Clearance: 34.7 mL/min (A) (by C-G formula based on SCr of 4.58 mg/dL (H)).  Assessment: 38 y.o. male with h/o Afib, Eliquis  on hold for CRRT, for bivalirudin  due to h/o HIT.   aPTT remains on higher end of goal range @ 98 sec on current rate of 0.005 mg/kg/hr (1.1 ml/hr).  No HD today.  aPTT 99 - therapeutic, on high end of range D/w MD on AM rounds. Ok to continue with Hgb 7.0, will transfuse 1u PRBC.   Goal of Therapy:  aPTT: 80-100 sec Monitor platelets by anticoagulation protocol: Yes   Plan:  Cont bivalrudin at 0.005 mg/kg/hr (1.1 ml/hr) -  --Alaris pump minimum is 0.1 mL/hr for any medication so downtitration is possible if/when needed. Soft minimum in Alaris pump for bivalirudin  infusion is 0.01 mcg/kg/min (already far below this soft minimum).  aPTT BID for now.   Sharyne Glatter, PharmD, BCCCP Clinical Pharmacist 01/25/2024 7:39 AM

## 2024-01-25 NOTE — Progress Notes (Signed)
 PT Cancellation Note  Patient Details Name: Phillip Young MRN: [id number] DOB: 08/26/1985   Cancelled Treatment:    Reason Eval/Treat Not Completed: Medical issues which prohibited therapy; pt with continued medical instability and decreased responsiveness.  Will sign off for now due to multiple cancels.  Please consult again when stable for mobility progression.  Thanks.    Montie Portal 01/25/2024, 1:28 PM Micheline Portal, PT Acute Rehabilitation Services Office:[phone] 01/25/2024

## 2024-01-25 NOTE — Progress Notes (Signed)
 01/25/2024 Medical director note  Case well known to me.  Spoke with mom for a while.  Will lower MAP threshold and trend lactates.  She is refusing blood transfusion understanding risk of death.  Plan is cortrak, midodrine , trial of stress steroids, and keeping levo at 5: if does okay with this will attempt iHD.  Not candidate to restart CRRT.  Prognosis very grim unfortunately I think his body gets weaker every admission; he now has end stage heart, lung, liver, and skin disease.  Rolan Sharps MD PCCM

## 2024-01-25 NOTE — Progress Notes (Signed)
 PHARMACY - ANTICOAGULATION  Pharmacy Consult for bivalirudin    Indication: atrial fibrillation w/ hx of HIT   Patient Measurements: Height: 6' (182.9 cm) Weight: (!) 164.3 kg (362 lb 3.5 oz) IBW/kg (Calculated) : 77.6 HEPARIN  DW (KG): 125.9  Vital Signs: Temp: 97.6 F (36.4 C) (07/02 1922) Temp Source: Axillary (07/02 1922) BP: 85/46 (07/02 1800) Pulse Rate: 117 (07/02 1810)  Labs: Recent Labs    01/23/24 0317 01/23/24 1400 01/24/24 0411 01/24/24 1306 01/24/24 1743 01/25/24 0500 01/25/24 2035  HGB 8.4*  --  8.2* 7.7*  --  7.0*  --   HCT 24.1*  --  24.0* 22.3*  --  20.3*  --   PLT 86*  --  80* 81*  --  84*  --   APTT 100*   < > 93* 98* 89* 99* 99*  LABPROT 46.7*  --  40.3*  --   --  40.0*  --   INR 4.8*  --  3.9*  --   --  3.9*  --   CREATININE 3.42*  --  4.05*  --   --  4.58*  --    < > = values in this interval not displayed.    Estimated Creatinine Clearance: 34.7 mL/min (A) (by C-G formula based on SCr of 4.58 mg/dL (H)).  Assessment: 38 y.o. male with h/o Afib, Eliquis  on hold for CRRT, for bivalirudin  due to h/o HIT.   aPTT remains on higher end of goal range @ 98 sec on current rate of 0.005 mg/kg/hr (1.1 ml/hr).  No HD today.  aPTT 99 - therapeutic, on high end of range. Again on 2nd shift.  D/w MD on AM rounds. Ok to continue with Hgb 7.0, will transfuse 1u PRBC.   Goal of Therapy:  aPTT: 80-100 sec Monitor platelets by anticoagulation protocol: Yes   Plan:  Cont bivalrudin at 0.005 mg/kg/hr (1.1 ml/hr) -  --Alaris pump minimum is 0.1 mL/hr for any medication so downtitration is possible if/when needed. Soft minimum in Alaris pump for bivalirudin  infusion is 0.01 mcg/kg/min (already far below this soft minimum).  aPTT BID for now.   Rankin Sams, PharmD, BCPS, BCCCP Clinical Pharmacist

## 2024-01-25 NOTE — Progress Notes (Signed)
 Nutrition Follow-up  DOCUMENTATION CODES:  Morbid obesity  INTERVENTION:  Once cortrak in place, initiate trickle feeds Vital High Protein at 56mL/h (433mL/d) This will provide 480 kcal, 42g of protein, and of free water   If pt's MAP improves and is appropriate for advancement of enteral feeds, recommend the following goal enteral regimen: Vital 1.5 at 65 ml/h (1560 ml per day) Prosource TF20 60 ml BID Provides 2500 kcal, 145 gm protein, 1192 ml free water  daily Renal MVI daily  NUTRITION DIAGNOSIS:  Increased nutrient needs related to chronic illness as evidenced by estimated needs. - Ongoing  GOAL:  Patient will meet greater than or equal to 90% of their needs - Ongoing   MONITOR:  PO intake, Labs, I & O's, Weight trends, Skin  REASON FOR ASSESSMENT:  Consult Assessment of nutrition requirement/status  ASSESSMENT:  Pt with hx of CHF, COPD, GERD, HTN, morbid obesity, and ESRD on HD presented to ED with concern for wound infections after leaving [facility] the day prior. Pt with recent admission in May for similar presentation.  6/10 - Admitted to ICU and started on CRRT 6/19 - CRRT stopped 6/23 - CRRT to be restarted on a trial x 3 days; NGT placed to LIWS 6/26 - CRRT stopped; diet advanced to clear liquids 6/27 - NGT removed; diet advanced to regular  Pt continues to require high dose pressors which is preventing HD. Last HD session was 7/29. MAP goal lowered to 50 with the aim to decrease pressor requirements and undergo HD. Pt more lethargic and unable to swallow this AM. Order for cortrak placed by CCM to ensure medications can be administered.   Discussed with MD, ok to start trickle feeds on pt as he has been without adequate nutritional intake since diet was readvanced. If pt is able to achieve hemodynamic stability will attempt to advance to goal regimen.   Admit weight: 180 kg   Current weight: 164.3 kg (6/28) EDW: 155.5 kg   Nutrition Related  Medications: Scheduled Meds:  hydrocortisone  sod succinate  100 mg Intravenous Q8H   multivitamin  1 tablet Oral QHS   pantoprazole  (PROTONIX ) IV  40 mg Intravenous Q12H   sodium bicarbonate   650 mg Oral TID   sodium zirconium cyclosilicate   10 g Per Tube Once   ursodiol   300 mg Oral BID   Continuous Infusions:  albumin  human Stopped (01/25/24 1246)   bivalirudin  (ANGIOMAX ) 250 mg in sodium chloride  0.9 % 500 mL (0.5 mg/mL) infusion 0.005 mg/kg/hr (01/25/24 1400)   feeding supplement (VITAL HIGH PROTEIN)     linezolid  (ZYVOX ) IV Stopped (01/25/24 1241)   meropenem  (MERREM ) IV Stopped (01/25/24 1044)   norepinephrine  (LEVOPHED ) Adult infusion 3 mcg/min (01/25/24 1400)   PRN Meds: alteplase , ondansetron     Labs: Sodium 132, chloride 90 Potassium 5.3 BUN 71, creatinine 4.58 Phosphorus 5.7 Bilirubin (total): 22.3 HgbA1c 5.9%  Diet Order:   Diet Order             Diet NPO time specified  Diet effective now                  EDUCATION NEEDS:  No education needs have been identified at this time  Skin:  Skin Assessment: Reviewed RN Assessment Skin tears  Last BM:  7/1 - type 6  Height:  Ht Readings from Last 1 Encounters:  01/22/24 6' (1.829 m)   Weight:  Wt Readings from Last 1 Encounters:  01/21/24 (!) 164.3 kg   Ideal Body Weight:  80.9 kg  BMI:  Body mass index is 49.13 kg/m.  Estimated Nutritional Needs:  Kcal:  2400-2600 kcal/d Protein:  140-160 g/d Fluid:  1L+UOP    Vernell Lukes, RD, LDN, CNSC Registered Dietitian II Please reach out via secure chat

## 2024-01-25 NOTE — Progress Notes (Signed)
 Blood refusal form has been given to pt's mother but will not sign until iHD has finished.   Form at bedside. Education given and mother verb understanding.

## 2024-01-26 ENCOUNTER — Telehealth: Payer: Self-pay | Admitting: *Deleted

## 2024-01-26 ENCOUNTER — Inpatient Hospital Stay (HOSPITAL_COMMUNITY)

## 2024-01-26 DIAGNOSIS — Z7189 Other specified counseling: Secondary | ICD-10-CM | POA: Diagnosis not present

## 2024-01-26 DIAGNOSIS — I469 Cardiac arrest, cause unspecified: Secondary | ICD-10-CM | POA: Diagnosis not present

## 2024-01-26 DIAGNOSIS — I5082 Biventricular heart failure: Secondary | ICD-10-CM | POA: Diagnosis not present

## 2024-01-26 DIAGNOSIS — A419 Sepsis, unspecified organism: Secondary | ICD-10-CM | POA: Diagnosis not present

## 2024-01-26 DIAGNOSIS — R579 Shock, unspecified: Secondary | ICD-10-CM | POA: Diagnosis not present

## 2024-01-26 DIAGNOSIS — D72829 Elevated white blood cell count, unspecified: Secondary | ICD-10-CM | POA: Diagnosis not present

## 2024-01-26 LAB — COMPREHENSIVE METABOLIC PANEL WITH GFR
ALT: 26 U/L (ref 0–44)
ALT: 24 U/L (ref 0–44)
AST: 38 U/L (ref 15–41)
AST: 39 U/L (ref 15–41)
Albumin: 2 g/dL — ABNORMAL LOW (ref 3.5–5.0)
Albumin: 1.7 g/dL — ABNORMAL LOW (ref 3.5–5.0)
Alkaline Phosphatase: 148 U/L — ABNORMAL HIGH (ref 38–126)
Alkaline Phosphatase: 125 U/L (ref 38–126)
Anion gap: 21 — ABNORMAL HIGH (ref 5–15)
Anion gap: 21 — ABNORMAL HIGH (ref 5–15)
BUN: 46 mg/dL — ABNORMAL HIGH (ref 6–20)
BUN: 44 mg/dL — ABNORMAL HIGH (ref 6–20)
CO2: 20 mmol/L — ABNORMAL LOW (ref 22–32)
CO2: 18 mmol/L — ABNORMAL LOW (ref 22–32)
Calcium: 8.4 mg/dL — ABNORMAL LOW (ref 8.9–10.3)
Calcium: 7.4 mg/dL — ABNORMAL LOW (ref 8.9–10.3)
Chloride: 93 mmol/L — ABNORMAL LOW (ref 98–111)
Chloride: 100 mmol/L (ref 98–111)
Creatinine, Ser: 3.28 mg/dL — ABNORMAL HIGH (ref 0.61–1.24)
Creatinine, Ser: 3.17 mg/dL — ABNORMAL HIGH (ref 0.61–1.24)
GFR, Estimated: 24 mL/min — ABNORMAL LOW (ref >60)
GFR, Estimated: 25 mL/min — ABNORMAL LOW (ref >60)
Glucose, Bld: 46 mg/dL — ABNORMAL LOW (ref 70–99)
Glucose, Bld: 71 mg/dL (ref 70–99)
Potassium: 4 mmol/L (ref 3.5–5.1)
Potassium: 3.9 mmol/L (ref 3.5–5.1)
Sodium: 134 mmol/L — ABNORMAL LOW (ref 135–145)
Sodium: 139 mmol/L (ref 135–145)
Total Bilirubin: 21 mg/dL (ref 0.0–1.2)
Total Bilirubin: 17.8 mg/dL — ABNORMAL HIGH (ref 0.0–1.2)
Total Protein: 4.8 g/dL — ABNORMAL LOW (ref 6.5–8.1)
Total Protein: 4 g/dL — ABNORMAL LOW (ref 6.5–8.1)

## 2024-01-26 LAB — POCT I-STAT 7, (LYTES, BLD GAS, ICA,H+H)
Acid-base deficit: 9 mmol/L — ABNORMAL HIGH (ref 0.0–2.0)
Acid-base deficit: 5 mmol/L — ABNORMAL HIGH (ref 0.0–2.0)
Bicarbonate: 18.9 mmol/L — ABNORMAL LOW (ref 20.0–28.0)
Bicarbonate: 22.6 mmol/L (ref 20.0–28.0)
Calcium, Ion: 1.11 mmol/L — ABNORMAL LOW (ref 1.15–1.40)
Calcium, Ion: 1.16 mmol/L (ref 1.15–1.40)
HCT: 25 % — ABNORMAL LOW (ref 39.0–52.0)
HCT: 20 % — ABNORMAL LOW (ref 39.0–52.0)
Hemoglobin: 8.5 g/dL — ABNORMAL LOW (ref 13.0–17.0)
Hemoglobin: 6.8 g/dL — CL (ref 13.0–17.0)
O2 Saturation: 87 %
O2 Saturation: 100 %
Patient temperature: 97.7
Patient temperature: 98.3
Potassium: 4.4 mmol/L (ref 3.5–5.1)
Potassium: 4.5 mmol/L (ref 3.5–5.1)
Sodium: 131 mmol/L — ABNORMAL LOW (ref 135–145)
Sodium: 135 mmol/L (ref 135–145)
TCO2: 20 mmol/L — ABNORMAL LOW (ref 22–32)
TCO2: 24 mmol/L (ref 22–32)
pCO2 arterial: 50.3 mmHg — ABNORMAL HIGH (ref 32–48)
pCO2 arterial: 55 mmHg — ABNORMAL HIGH (ref 32–48)
pH, Arterial: 7.181 — CL (ref 7.35–7.45)
pH, Arterial: 7.222 — ABNORMAL LOW (ref 7.35–7.45)
pO2, Arterial: 65 mmHg — ABNORMAL LOW (ref 83–108)
pO2, Arterial: 250 mmHg — ABNORMAL HIGH (ref 83–108)

## 2024-01-26 LAB — PROTIME-INR
INR: 6.7 (ref 0.8–1.2)
Prothrombin Time: 61 s — ABNORMAL HIGH (ref 11.4–15.2)

## 2024-01-26 LAB — CBC
HCT: 20.2 % — ABNORMAL LOW (ref 39.0–52.0)
Hemoglobin: 6.8 g/dL — CL (ref 13.0–17.0)
MCH: 29.3 pg (ref 26.0–34.0)
MCHC: 33.7 g/dL (ref 30.0–36.0)
MCV: 87.1 fL (ref 80.0–100.0)
Platelets: 89 10*3/uL — ABNORMAL LOW (ref 150–400)
RBC: 2.32 MIL/uL — ABNORMAL LOW (ref 4.22–5.81)
RDW: 22.4 % — ABNORMAL HIGH (ref 11.5–15.5)
WBC: 44 10*3/uL — ABNORMAL HIGH (ref 4.0–10.5)
nRBC: 0.5 % — ABNORMAL HIGH (ref 0.0–0.2)

## 2024-01-26 LAB — TYPE AND SCREEN
ABO/RH(D): B POS
Antibody Screen: NEGATIVE

## 2024-01-26 LAB — CBC WITH DIFFERENTIAL/PLATELET
Abs Immature Granulocytes: 0 10*3/uL (ref 0.00–0.07)
Band Neutrophils: 30 %
Basophils Absolute: 0 10*3/uL (ref 0.0–0.1)
Basophils Absolute: 0 10*3/uL (ref 0.0–0.1)
Basophils Relative: 0 %
Basophils Relative: 0 %
Eosinophils Absolute: 0.4 10*3/uL (ref 0.0–0.5)
Eosinophils Absolute: 0 10*3/uL (ref 0.0–0.5)
Eosinophils Relative: 1 %
Eosinophils Relative: 0 %
HCT: 18.3 % — ABNORMAL LOW (ref 39.0–52.0)
HCT: 14 % — ABNORMAL LOW (ref 39.0–52.0)
Hemoglobin: 6.4 g/dL — CL (ref 13.0–17.0)
Hemoglobin: 4.7 g/dL — CL (ref 13.0–17.0)
Lymphocytes Relative: 3 %
Lymphocytes Relative: 2 %
Lymphs Abs: 1.1 10*3/uL (ref 0.7–4.0)
Lymphs Abs: 0.6 10*3/uL — ABNORMAL LOW (ref 0.7–4.0)
MCH: 29.6 pg (ref 26.0–34.0)
MCH: 30.1 pg (ref 26.0–34.0)
MCHC: 35 g/dL (ref 30.0–36.0)
MCHC: 33.6 g/dL (ref 30.0–36.0)
MCV: 84.7 fL (ref 80.0–100.0)
MCV: 89.7 fL (ref 80.0–100.0)
Metamyelocytes Relative: 2 %
Monocytes Absolute: 0.7 10*3/uL (ref 0.1–1.0)
Monocytes Absolute: 0.9 10*3/uL (ref 0.1–1.0)
Monocytes Relative: 2 %
Monocytes Relative: 3 %
Neutro Abs: 34.4 10*3/uL — ABNORMAL HIGH (ref 1.7–7.7)
Neutro Abs: 27.5 10*3/uL — ABNORMAL HIGH (ref 1.7–7.7)
Neutrophils Relative %: 64 %
Neutrophils Relative %: 92 %
Platelets: 73 10*3/uL — ABNORMAL LOW (ref 150–400)
Platelets: 59 10*3/uL — ABNORMAL LOW (ref 150–400)
Promyelocytes Relative: 1 %
RBC: 2.16 MIL/uL — ABNORMAL LOW (ref 4.22–5.81)
RBC: 1.56 MIL/uL — ABNORMAL LOW (ref 4.22–5.81)
RDW: 21.9 % — ABNORMAL HIGH (ref 11.5–15.5)
RDW: 22.9 % — ABNORMAL HIGH (ref 11.5–15.5)
Smear Review: NORMAL
Smear Review: NORMAL
WBC Morphology: INCREASED
WBC: 36.6 10*3/uL — ABNORMAL HIGH (ref 4.0–10.5)
WBC: 29.9 10*3/uL — ABNORMAL HIGH (ref 4.0–10.5)
nRBC: 0.3 % — ABNORMAL HIGH (ref 0.0–0.2)
nRBC: 0.4 % — ABNORMAL HIGH (ref 0.0–0.2)

## 2024-01-26 LAB — GLUCOSE, CAPILLARY
Glucose-Capillary: 129 mg/dL — ABNORMAL HIGH (ref 70–99)
Glucose-Capillary: 135 mg/dL — ABNORMAL HIGH (ref 70–99)
Glucose-Capillary: 56 mg/dL — ABNORMAL LOW (ref 70–99)
Glucose-Capillary: 61 mg/dL — ABNORMAL LOW (ref 70–99)
Glucose-Capillary: 83 mg/dL (ref 70–99)
Glucose-Capillary: 86 mg/dL (ref 70–99)
Glucose-Capillary: 87 mg/dL (ref 70–99)

## 2024-01-26 LAB — COOXEMETRY PANEL
Carboxyhemoglobin: 2.3 % — ABNORMAL HIGH (ref 0.5–1.5)
Methemoglobin: <0.7 % (ref 0.0–1.5)
O2 Saturation: 36.7 %
Total hemoglobin: <6.9 g/dL — CL (ref 12.0–16.0)

## 2024-01-26 LAB — LACTIC ACID, PLASMA
Lactic Acid, Venous: 3.6 mmol/L (ref 0.5–1.9)
Lactic Acid, Venous: 6.1 mmol/L (ref 0.5–1.9)

## 2024-01-26 LAB — APTT
aPTT: 102 s — ABNORMAL HIGH (ref 24–36)
aPTT: 91 s — ABNORMAL HIGH (ref 24–36)
aPTT: 101 s — ABNORMAL HIGH (ref 24–36)

## 2024-01-26 LAB — AMMONIA: Ammonia: 72 umol/L — ABNORMAL HIGH (ref 9–35)

## 2024-01-26 LAB — PROCALCITONIN: Procalcitonin: 27.74 ng/mL

## 2024-01-26 LAB — NO BLOOD PRODUCTS

## 2024-01-26 MED ORDER — FENTANYL CITRATE PF 50 MCG/ML IJ SOSY: 25.0000 ug | PREFILLED_SYRINGE | Freq: Once | INTRAMUSCULAR | Status: DC

## 2024-01-26 MED ORDER — POLYETHYLENE GLYCOL 3350 17 G PO PACK: 17.0000 g | PACK | Freq: Every day | ORAL | Status: DC

## 2024-01-26 MED ORDER — SODIUM CHLORIDE 0.9 % IV SOLN
150.0000 mg | INTRAVENOUS | Status: DC
  Administered 2024-01-26 – 2024-01-27 (×2): 150 mg via INTRAVENOUS
  Filled 2024-01-26 (×3): qty 7.5

## 2024-01-26 MED ORDER — MIDAZOLAM HCL 2 MG/2ML IJ SOLN
INTRAMUSCULAR | Status: AC
  Administered 2024-01-26: 2 mg
  Filled 2024-01-26: qty 2

## 2024-01-26 MED ORDER — SODIUM BICARBONATE 650 MG PO TABS
650.0000 mg | ORAL_TABLET | Freq: Three times a day (TID) | ORAL | Status: DC
  Administered 2024-01-26 – 2024-01-28 (×8): 650 mg
  Filled 2024-01-26 (×8): qty 1

## 2024-01-26 MED ORDER — DEXTROSE 50 % IV SOLN: 1.0000 | Freq: Once | INTRAVENOUS | Status: AC

## 2024-01-26 MED ORDER — DEXTROSE 50 % IV SOLN
INTRAVENOUS | Status: AC
  Filled 2024-01-26: qty 50

## 2024-01-26 MED ORDER — FENTANYL BOLUS VIA INFUSION
25.0000 ug | INTRAVENOUS | Status: DC | PRN
  Administered 2024-01-27: 100 ug via INTRAVENOUS
  Administered 2024-01-28: 50 ug via INTRAVENOUS
  Administered 2024-01-28: 25 ug via INTRAVENOUS
  Administered 2024-01-28: 50 ug via INTRAVENOUS

## 2024-01-26 MED ORDER — ROCURONIUM BROMIDE 10 MG/ML (PF) SYRINGE
PREFILLED_SYRINGE | INTRAVENOUS | Status: AC
  Filled 2024-01-26: qty 10

## 2024-01-26 MED ORDER — SODIUM BICARBONATE 8.4 % IV SOLN
50.0000 meq | Freq: Once | INTRAVENOUS | Status: AC
  Administered 2024-01-26: 50 meq via INTRAVENOUS
  Filled 2024-01-26: qty 50

## 2024-01-26 MED ORDER — SUCCINYLCHOLINE CHLORIDE 200 MG/10ML IV SOSY
PREFILLED_SYRINGE | INTRAVENOUS | Status: AC
  Filled 2024-01-26: qty 10

## 2024-01-26 MED ORDER — SODIUM CHLORIDE 0.9 % IV SOLN
0.0030 mg/kg/h | INTRAVENOUS | Status: DC
  Filled 2024-01-26: qty 250

## 2024-01-26 MED ORDER — DEXTROSE 50 % IV SOLN
1.0000 | Freq: Once | INTRAVENOUS | Status: AC
  Administered 2024-01-26: 50 mL via INTRAVENOUS
  Filled 2024-01-26: qty 50

## 2024-01-26 MED ORDER — LACTULOSE 10 GM/15ML PO SOLN
20.0000 g | Freq: Two times a day (BID) | ORAL | Status: DC
  Administered 2024-01-26 – 2024-01-28 (×5): 20 g
  Filled 2024-01-26 (×5): qty 30

## 2024-01-26 MED ORDER — FENTANYL CITRATE PF 50 MCG/ML IJ SOSY
PREFILLED_SYRINGE | INTRAMUSCULAR | Status: AC
  Administered 2024-01-26: 100 ug
  Filled 2024-01-26: qty 2

## 2024-01-26 MED ORDER — FENTANYL 2500MCG IN NS 250ML (10MCG/ML) PREMIX INFUSION
0.0000 ug/h | INTRAVENOUS | Status: DC
  Administered 2024-01-26: 50 ug/h via INTRAVENOUS
  Filled 2024-01-26: qty 250

## 2024-01-26 MED ORDER — ETOMIDATE 2 MG/ML IV SOLN
INTRAVENOUS | Status: AC
  Filled 2024-01-26: qty 20

## 2024-01-26 MED ORDER — RENA-VITE PO TABS
1.0000 | ORAL_TABLET | Freq: Every day | ORAL | Status: DC
  Administered 2024-01-26 – 2024-01-27 (×3): 1
  Filled 2024-01-26 (×3): qty 1

## 2024-01-26 MED ORDER — DEXTROSE 10 % IV SOLN: INTRAVENOUS | Status: DC

## 2024-01-26 MED ORDER — DEXTROSE 50 % IV SOLN
25.0000 g | INTRAVENOUS | Status: AC
  Administered 2024-01-26: 25 g via INTRAVENOUS

## 2024-01-26 MED ORDER — VASOPRESSIN 20 UNITS/100 ML INFUSION FOR SHOCK: 0.0000 [IU]/min | INTRAVENOUS | Status: DC

## 2024-01-26 MED ORDER — DEXMEDETOMIDINE HCL IN NACL 400 MCG/100ML IV SOLN
0.0000 ug/kg/h | INTRAVENOUS | Status: DC
  Administered 2024-01-26 – 2024-01-28 (×4): 0.2 ug/kg/h via INTRAVENOUS
  Filled 2024-01-26 (×4): qty 100

## 2024-01-26 MED ORDER — SODIUM CHLORIDE 0.9% IV SOLUTION: Freq: Once | INTRAVENOUS | Status: DC

## 2024-01-26 MED ORDER — DOCUSATE SODIUM 50 MG/5ML PO LIQD
100.0000 mg | Freq: Two times a day (BID) | ORAL | Status: DC
  Administered 2024-01-26 – 2024-01-27 (×3): 100 mg
  Filled 2024-01-26 (×3): qty 10

## 2024-01-26 NOTE — Code Documentation (Signed)
 Code Blue Note:  During meeting with Ethics Committee representative, patient lost his pulse and went into cardiac arrest.   2 rounds of CPR completed prior to return of spontaneous circulation. He was given epinephrine , calcium , and bicarb. He was given atropine with initial bradycardia with ROSC. Airway maintained with bag mask valve and then patient intubated. Given fentanyl , versed  and rocuronium . Glydescope used for intubation. Airway cleared with suction, dried material appeared to be partially occluding the airway.   Dorn Chill, MD [facility] Office: [phone]   See Amion for personal pager PCCM on call pager [phone] until 7pm. Please call Elink 7p-7a. [phone]
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
## 2024-01-26 NOTE — Progress Notes (Signed)
 0930: [name] still refusing blood transfusion for patient despite drop in hemoglobin. MD at bedside confirming refusal. Mother refusing to sign her name to blood refusal form.    1434: During ethics meeting with mother at bedside, patient's heart rate suddenly dropped and patient went into cardiac arrest and CPR immediately initiated. See code report for details. After code complete, mother stated, Imagine what would have happened if I had left earlier and this had happened. No one would have been in the room with him and no one would have probably noticed what was happening. It was God's will that the doctors and nurses were in the room. Everything will be fine.

## 2024-01-26 NOTE — Progress Notes (Incomplete)
 [name] called out. Patient's RN responded to call bell.

## 2024-01-26 NOTE — Progress Notes (Signed)
 RT placed pt on home BiPAP per Mothers request. Once RT placed mask on pt he was grabbing at his mask and mother asked RT to take it off due to discomfort. Mother also stated pt needed cortrax out before we put on BiPAP. MD will be made aware.

## 2024-01-26 NOTE — Progress Notes (Signed)
 PCCM Update:  Called [facility] transfer line today to request transfer per mother's request. Patient was denied transfer as no higher level of care needed.   Information was relayed to patient's mother.  Ethics committee meeting held today. Ethics informed patient's mother that all medical options have been exhausted and that there is nothing more to offer. She was informed that if she wants transfer of care that she is now responsible for finding a hospital that will accept him for further care. She expressed understanding.  Dorn Chill, MD [facility] Office: [phone]   See Amion for personal pager PCCM on call pager [phone] until 7pm. Please call Elink 7p-7a. [phone]

## 2024-01-26 NOTE — Ethics Note (Addendum)
 Ethics Consult Note  Initial contact w/ medical team 01/26/24, which was on patient's hospital day 23   Source of Consult: Dr Kara.  Current attending physician/service: Kara Dorn NOVAK, MD  Reason(s) for consult / relevant ethical question(s): Mr. Armstrong has developed multiorgan failure despite aggressive medical care. He will benefit from palliative measures in order to avoid further suffering. [name] is in disagreement with this and would like to continue current line of therapy.   Information-gathering: Discussion with source of consult Chart review @TODAY @  Family meeting with medical team, nursing, and Mrs. Upshur (patient's mother), took place at the bedside.   Narrative:  Medical situation:  Mr. Condie was hospitalized 23 days ago with a diagnosis of shock, due to heart failure and systemic infection. Despite aggressive medical therapy including vasopressors, ventilatory support (non invasive mechanical ventilation), and renal replacement therapy, unfortunately he has continued to deteriorate. All medical providers, have agreed that Mr. Prats is in a terminal condition and all interventions have not resulted in any meaningful improvement.   Mrs. Arizpe has been informed about her son's  terminal condition and she expresses full understanding of this situation and knows that patient can die any time.  It has been attempted to transfer patient to another institution per his mother request, but patient has been declined.  She has been informed that if she continues to desire a transfer, according to the hospital regulations ,she will have to find another facility.  It is recommended to give patient's family a reasonable amount of time to find another hospital, at the conclusion of this time, I think another meeting will need to take place to address, the futile care protocol.   Patient's decision-making capacity: [name], Mrs. Deyoung is making medical decisions  for her son. Currently patient is not able to make his own medical decisions.  Note: The meeting was concluded because patient developed cardiac arrest and required resuscitation interventions.   Recommendations / Ethical Analysis:  It is recommended to give patient's family a reasonable amount of time to find another hospital, at the conclusion of this time, I think another meeting will need to take place to address, the futile care protocol.   AUTONOMY  Competence  Capacity  Informed Consent/Refusal  BENEFICENCE & NON-MALEFICENCE   Forcing or withholding treatment(s)  Emergency  JUSTICE  Principles equally/equitably applied  Triage  PATERNALISM   Unilateral decision-making by the attending physician  MEDICAL FUTILITY   Relevant underlying ethical principles were discussed directly with medical and nursing team. Other resources were discussed directly with them, if needed. Ethics committee strives to ensure that all necessary and appropriate steps are taken by the medical team such that all decisions made for this patient are ethically appropriate. Please note that Ethics committee members will NOT offer legal or medical counsel.  Thank you for this consult. Ethics will continue to follow this case.   Dr Kara has my personal cell phone number and has my permission to share this number at their discretion.  Secure message on Epic is also welcome but may not receive an immediate response.  Please reference AMION for on-call committee member if needed.    Fallen Crisostomo [facility] Ethics Committee

## 2024-01-26 NOTE — Progress Notes (Signed)
 Cross covering ICU physician  Called to bedside to discuss with family pt's persistent and progressive hypotension. [name] map 40 on 40 norepi. Reading thru chart I understand the great difficulties this pt has been thru as well as family. Ethics, palliative, hf, nephrology and ccm have all convened and the argument of futility has been made. Other family at bedside (aunt) was spoken with as well as RN's who have been caring for the patient and they all endorse that pt reported he would not want to be prolonged on life support and he was understanding and aware that he was dying.   However family states that pt's mother and now decision maker for the pt is rightly suffering with the reality of his condition and she has requested full code. She is on her way back to ICU and I will speak with her. Unfortunately, there are no further interventions to offer and pt will most certainly cont to deteriorate BP wise in addition to his already established co morbidities. [name] mother has reportedly fired multiple physicians already and so the communication is unfortunately limited to receptiveness.   Update:   Had good conversation with pt's mother, sister, and aunt. Charge RN, Nurse, mental health and Retail banker, Building surveyor.  The express understanding that pt is dying. They state he would want full code, he would refuse blood. They state they are not ready for comfort care but understand that he may code this evening and what that would entail with cpr/shock/medications. I advised them that as he is already on max support that these efforts will not likely result in ROSC. They stated they understand. They do want him to be comfortable for now without de-escalating his current therapies, but also endorsed that they understand we are not escalating any care at this time.   All questions answered to best of my ability.

## 2024-01-26 NOTE — Progress Notes (Incomplete)
 Verbal order to titrate
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
## 2024-01-26 NOTE — Progress Notes (Addendum)
 eLink Physician-Brief Progress Note Patient Name: Phillip Young DOB: [birth year] MRN: [id number]   Date of Service  01/26/2024  HPI/Events of Note  Hemoglobin 6.4 this morning, remains on vasopressors [name] and patient inconsistent about goals of care and recently refusing blood products  eICU Interventions  Agree with ordered ethics consultation.  Limited medical treatment is not consistent with full CODE STATUS.  If the patient does have a cardiac arrest, ongoing limited treatment would certainly be futile.   0702 -asked to evaluate the patient's respiratory distress.  The patient seems to be in increasing respiratory distress compared to the beginning of the night laboratory studies reviewed and there is evidence of hypoglycemia.  CBG checks have been discontinued.  Will administer hypoglycemia protocol and initiate CBG checks.  I tried to discuss this with the patient's mother at bedside who yelled at me throughout the explanation and interrupted me every few words.  We discussed the fact that respirations have gotten worse and that the constellation of findings is concerning for imminent decline.  She was insistent that we focus on transferring the patient and and that she knows better about patient transport than anyone else.  She disagrees with their opinion that [facility] is currently unsafe for transport and believes that we are maliciously obstructing his care.  Despite the timing that have been ongoing conversation with her, she continued to interrupt me, be disruptive, and cause disruption to nursing and other patients on the unit.  Ultimately, it was decided to defer further conversation for in person evaluation.  Intervention Category Intermediate Interventions: Hypotension - evaluation and management  Sharel Behne 01/26/2024, 6:03 AM

## 2024-01-26 NOTE — Progress Notes (Addendum)
 eLink Physician-Brief Progress Note Patient Name: Phillip Young DOB: 01-Jun-1986 MRN: [id number]   Date of Service  01/26/2024  HPI/Events of Note  Patient is hypotensive unable to meet MAP goal of 45.  Primary team has evaluated the patient extensively.  No additional pressors are indicated, norepinephrine  was To 40 mcg.  Ethics committee has communicated with the family as well, no additional interventions to be offered.  Mother is also refusing numerous foci of care.  eICU Interventions  For now, maintain systolic greater than 68 as per the written order.  If we are unable to make this, unfortunately it signals continue cardiopulmonary decline and will need to be addressed with mother at bedside     Intervention Category Intermediate Interventions: Hypotension - evaluation and management  Maretta Overdorf 01/26/2024, 8:01 PM

## 2024-01-26 NOTE — Plan of Care (Signed)
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
## 2024-01-26 NOTE — Progress Notes (Signed)
 NAME:  Phillip Young, MRN:  [id number], DOB:  04-Oct-1985, LOS: 23 ADMISSION DATE:  01/03/2024, CONSULTATION DATE:  01/03/24 REFERRING MD:  Dr. Patsey, CHIEF COMPLAINT:  SOB/ chills   History of Present Illness:  27 yoM w/PMH as below significant for biventricular HF (EF < 20%), chronic afib on Eliquis , chronic hypotension on midodrine  30mg  TID, ESRD, chronic hypoxic and hypercarbic respiratory failure on NIV, morbid obesity, medical non-compliance, and chronic pain presenting from home after being discharged from [facility] 6/9 with SOB, chills, subjective fever, ongoing poor PO, vomiting today strings of blood but then reported it was with coughing episodes, pain all over, and increased weeping from left thigh blister.  Had BM, unclear on consistency/ color.  Reports last dialysis yesterday.  Initially reports not taking any PO meds today but then said he did this morning.  Non- ambulatory at baseline.  Frequent admits, most recently 5/13-5/22 for recurrent septic shock and MRSE bacteremia due to suspected line complicated by acute on chronic HF and respiratory failure, suspected IE but not a candidate for TEE, discharged on vancomycin  with HD scheduled thru 6/30.  TDC was replaced 5/22 after 2 day line holiday.  Also started on micafungin  w/HD at [facility] after [facility] grew candida species, scheduled thru 6/30.   In ER, temp 99.1, on HFNC at 6L, with SBP into 70's requiring NE support after 500 ml bolus.  Labs significant for Na 129, K 6.3, Cl 89, alk phos 346, AST 84, t.bili 12 (recent 3.4 on 5/14), lactic 3.8, WBC 20.6 (previously 8.1 on 5/22), INR 2.9, CXR showing vascular congestion and increasing congestion and pleural effusions.  BC sent and empirically started on linezolid , flagyl , and cefepime .  Pending CT chest/abd/ pelvis.  Nephrology consulted.  PCCM called for admit.   Pertinent  Medical History   Past Medical History:  Diagnosis Date   Acute on chronic respiratory failure with hypoxia ([name])  04/21/2021   Acute on chronic systolic (congestive) heart failure ([name]) 02/26/2020   Amiodarone  toxicity    Anemia    Biventricular congestive heart failure ([name])    Candida parapsilosis infection 12/26/2023   Chronic hypoxemic respiratory failure ([name])    Class 3 severe obesity due to excess calories with serious comorbidity and body mass index (BMI) of 50.0 to 59.9 in adult 02/26/2020   ESRD on hemodialysis [facility])    Essential hypertension 02/26/2020   GERD without esophagitis 02/26/2020   Hidradenitis suppurativa 02/26/2020   NICM (nonischemic cardiomyopathy) ([name])    Obesity hypoventilation syndrome ([name])    OSA (obstructive sleep apnea)    Pneumonia    Prediabetes 02/26/2020   Significant Hospital Events: Including procedures, antibiotic start and stop dates in addition to other pertinent events   6/10 Admit 6/12 Remains on vasopressors.  Spoke to heart failure.  Not a candidate for advanced therapies.  Not a candidate for tailored hemodynamics.  Recommend a strategy of vasopressor support at fluid removal in an attempt to transition back to IHD. 6/15 patient brought up discussion of limiting some of his care; Palliative re-consulted.  6/16 palliative consulted per patient's request. CT unrevealing for cause of abdominal pain. 6/23 nephro decided to restart CRRT despite prior nephro stopping. Remains hypotensive, mother did not consent central line, PICC delayed, peripheral pressors and resume CRRT, developed ileus vs SBO, NG placed to [facility] 6/24 PICC could not be placed, mother refused central line again 6/25 central line placed 6/26 CRRT stopped 6/27 on low dose levophed  6/28 on levophed  3-15mcg/min, tolerated iHD  6/29 increasing levophed  requirements 6/30 remained on levophed  above , arterial line declined 6/31 remained on levophed , not able to do iHD due to elevated pressor requirements 7/2 received dialysis, 1L removed. MAP goal changed to 45.  Interim History /  Subjective:   Patient with increased breathing distress. Chest x-ray refused by mother at bedside.  ABG 7.18, pCO2 50, pO2 65%  Hemoglobin less than 7 this morning, transfusion refused by mother.   Mother agreed to starting tube feeds  Call placed to [facility]  Call placed to ethics committee   On 3mcg of levophed   Hypoglycemic episodes   Objective    Blood pressure (!) 59/43, pulse (!) 102, temperature 97.7 F (36.5 C), temperature source Axillary, resp. rate (!) 21, height 6' (1.829 m), weight (!) 164.3 kg, SpO2 91%.    FiO2 (%):  [40 %] 40 %   Intake/Output Summary (Last 24 hours) at 01/26/2024 0954 Last data filed at 01/26/2024 0800 Gross per 24 hour  Intake 1054.27 ml  Output 1000 ml  Net 54.27 ml   Filed Weights   01/19/24 0500 01/20/24 0600 01/21/24 0400  Weight: (!) 162.6 kg (!) 163 kg (!) 164.3 kg   Examination: Chronically ill appearing obese man, laying in bed On nasal cannula, in moderate respiratory distress Breath and lung sounds diminished, No wheeze RRR Abdomen obese, soft, non-tender Non pitting peripheral edema Awake, moving all extremities, intermittently following commands   Resolved problem list:    Assessment and Plan  Shock ([name])> suspect primarily cardiogenic Continue vasopressor support with Levophed , MAP goal 45 or greater Meropenem  and linezolid  for empiric coverage, procalcicontin is elevated - trending down Not a candidate for advanced heart failure therapy, reached out to heartfailure team 7/1 - no further recommendations Central line placed 6/25, arterial line placed 7/1 after approval by mother Continue midodrine  per tube Trend lactic acid and Coox  Give amp of bicarb this morning  Acute Metabolic Encephalopathy Uremia vs shock vs hepatic  iHD completed 7/2 without improvement in mental status Ammonia 77, lactulose  BID ordered  Leukocytosis: Rising WBC, pressors up and down, no fever, unable to remove central line  with ongoing pressor need and possible iHD.  Vanc course end 6/30 Continue empiric meropenem /linezolid  Trend procalcitonin  Anemia Offered blood transfusion but refused by mother per patient's wishes 7/2 and again 7/3  Hyperbilirubinemia-- POA, congestive vs low forward flow state. 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 No signs of obstruction.  Acaculous cholecystitis is possible> no suggestion of this on any of his imaging. Patent hepatic portal veins so no Budd Chiari. Low suspicion for DILI with normal transaminases and no obvious culprit.  Continue trial of ursodiol   Acute on chronic biventricular congestive heart failure ([name])-combined sysolic & diastolic; known NICM. Echo shows severe RV and LV failure with EF <20%. Non-compliant for many years. Per advanced heart failure, patient is not a candidate for advanced therapy Unable to tolerate GDMT in the setting of hypotension CRRT was stopped, still requiring vasopressor support, would not tolerate IHD, CRRT resumed time limited trial 6/23 for 3 days, ended AM 6/25 If he is unable to wean off pressors, hospice care will be the only reasonable option.  Palliative care following peripherally. Ethics consulted.  ESRD on hemodialysis CRRT was stopped, time limited trial again after discussion  6/23 and ended 6/26 He had iHD 6/29 AM and 7/2. MAP goal adjusted in order to get to levophed  goal in order to attempt iHD.  Nephrology following  Constipation  SBO vs Ileus on plain film 6/23 (resolved) Had BM 7/1. Continue to offer bowel regimen  Obesity hypoventilation syndrome - Intermittently compliant with home BIPAP. Recommend at bedtime or when sleeping otherwise he use bipap; patient intermittently declining   Obesity,  BMI 49 Moderate Protein Calorie Malnutrition - feeding supplement as tolerated - nutrition evaluation - cortrak in place, start tube feeds  Permanent atrial fibrillation  Amiodarone  intolerant Heart rate is controlled Bival stopped with bleeding wounds 6/24, resume 6/25 ad hgb stable and no further bleeding Due to hyperbilirubinemia, can not switch him to Eliquis   Digoxin  repeat dose 6/29 - will hold addl doses as unlikely will tolerate iHD  Chronic pain disorder Continue pregabalin  Dilaudid  PRN  Mild hyperammonemia Lactulose  BID ordered  Skin ulceration; recent cellulitis  Has chronic skin ulceration, and he routinely has been refusing dressing changes. He understands he will not be able to make progress if we cannot keep his wounds clean. On Vanc- dosing per pharmacy - to end 6/30  Long term prognosis remains grim. No good options if he is not able to tolerate iHD. Appreciate Palliative care team's management. Most likely outcome is in hospital death. GOC remain full code yet at times refuse appropriate and aggressive medical care as recommended. Ethics committee has been consulted. Reaching out to other medical centers for transfer request per mother.   Best Practice (right click and Reselect all SmartList Selections daily)   Diet/type: tubefeeds DVT prophylaxis SCD, bival gtt Pressure ulcer(s): present on admission  GI prophylaxis: PPI Lines: Dialysis Catheter-pta R TDC (12/15/23) Foley:  N/A Code Status:  full code Last date of multidisciplinary goals of care discussion - updated mother at bedside today  CRITICAL CARE Performed by: Dorn KATHEE Chill   Total critical care time: 40 minutes  Critical care time was exclusive of separately billable procedures and treating other patients.  Critical care was necessary to treat or prevent imminent or life-threatening deterioration.  Critical care was time spent personally by me on the following activities: development of  treatment plan with patient and/or surrogate as well as nursing, discussions with consultants, evaluation of patient's response to treatment, examination of patient, obtaining history from patient or surrogate, ordering and performing treatments and interventions, ordering and review of laboratory studies, ordering and review of radiographic studies, pulse oximetry and re-evaluation of patient's condition.      Dorn Chill, MD [facility] Office: [phone]   See Amion for personal pager PCCM on call pager [phone] until 7pm. Please call Elink 7p-7a. [phone]

## 2024-01-26 NOTE — Progress Notes (Signed)
 Daily Progress Note   Patient Name: Phillip Young       Date: 01/23/2024 DOB: [birth year]  Age: 38 y.o. MRN#: [id number] Attending Physician: Kara Dorn NOVAK, MD Primary Care Physician: [name], DO Admit Date: 01/03/2024  Reason for Consultation/Follow-up: Establishing goals of care  Subjective: I discussed patient's care with primary attending, ethics committee MD, ICU medical director, and RN ahead of ethics meeting. Medical records reviewed including progress notes, labs, imaging, MAR.   Patient assessed at the bedside. He is hypotensive tachypneic and restless, appears to be actively dying. His mother is present visiting.  I offered to attend meeting for support and patient's mother declined this PA's participation. She thanked me for my services and restated that she did not want palliative involvement after I clarified intent and role as an additional layer of support. I shared with the rest of the team.  Questions and concerns addressed. PMT will continue to support holistically.   Length of Stay: 20   Physical Exam Vitals and nursing note reviewed.  Constitutional:      Appearance: He is ill-appearing.  Cardiovascular:     Rate and Rhythm: Tachycardia present.  Pulmonary:     Effort: Tachypnea and respiratory distress present.  Skin:    General: Skin is warm and dry.  Neurological:     Mental Status: He is lethargic.  Psychiatric:        Cognition and Memory: Cognition is impaired.     Comments: restless             Vital Signs: BP (!) 75/54   Pulse (!) 112   Temp 97.6 F (36.4 C) (Axillary)   Resp (!) 23   Ht 6' (1.829 m)   Wt (!) 164.3 kg   SpO2 99%   BMI 49.13 kg/m  SpO2: SpO2: 99 % O2 Device: O2 Device: Nasal Cannula O2 Flow Rate: O2 Flow Rate (L/min): 5 L/min      Palliative Assessment/Data:  10%   Palliative Care Assessment & Plan   Patient Profile: 38 y.o. male  with past medical history of biventricular HF (EF < 20%), chronic afib on Eliquis , chronic hypotension on midodrine  30mg  TID, ESRD, chronic hypoxic and hypercarbic respiratory failure on NIV, morbid obesity, medical non-compliance  admitted on 01/03/2024 with pain, left wound weeping.    Patient has had 7 hospitalizations in the past 6 months. Most recent admit 5/13-5/22 for recurrent septic shock and MRSE bacteremia due to suspected line complicated by acute on chronic HF and respiratory failure, suspected IE but not a candidate for TEE, discharged on vancomycin  with HD scheduled thru 6/30.     He is currently admitted for shock (cardiogenic, septic shock likely as well) with acute on chronic CHF and requiring pressors. He has multiple wounds and ulcers but refusing dressing changes. Also with constipation and opting against bowel regimen.    PMT has been consulted to assist with goals of care conversation at patient's request.  Assessment: Goals of care conversation End-stage renal disease on dialysis Cardiogenic shock, worsening Acute on chronic CHF biventricular Chronic pain Hyperbilirubinemia End-stage renal disease on CRRT SBO  Recommendations/Plan: [name] continues to decline palliative involvement and hopes for a  transfer to outside facility. Primary team is calling transfer centers accordingly Agree with medical futility of continued aggressive care in this circumstance. Aggressive life-prolonging measures are futile and will not improve patient's QOL, will serve no medical benefit, or may in fact cause harm. Psychosocial and emotional support provided PMT will continue to follow peripherally   Prognosis: Very poor  Discharge Planning: Anticipated Hospital Death    MDM: High         Gianmarco Roye SHAUNNA Fell, PA-C  Palliative Medicine Team Team phone # [phone]  Thank you for allowing  the Palliative Medicine Team to assist in the care of this patient. Please utilize secure chat with additional questions, if there is no response within 30 minutes please call the above phone number.  Palliative Medicine Team providers are available by phone from 7am to 7pm daily and can be reached through the team cell phone.  Should this patient require assistance outside of these hours, please call the patient's attending physician.

## 2024-01-26 NOTE — Progress Notes (Addendum)
 PHARMACY - ANTICOAGULATION  Pharmacy Consult for bivalirudin    Indication: atrial fibrillation w/ hx of HIT   Patient Measurements: Height: 6' (182.9 cm) Weight: (!) 164.3 kg (362 lb 3.5 oz) IBW/kg (Calculated) : 77.6 HEPARIN  DW (KG): 125.9  Vital Signs: Temp: 97.2 F (36.2 C) (07/03 0311) Temp Source: Axillary (07/03 0311) BP: 79/57 (07/03 0700) Pulse Rate: 123 (07/03 0700)  Labs: Recent Labs    01/24/24 0411 01/24/24 1306 01/24/24 1743 01/25/24 0500 01/25/24 2035 01/26/24 0524  HGB 8.2* 7.7*  --  7.0*  --  6.4*  HCT 24.0* 22.3*  --  20.3*  --  18.3*  PLT 80* 81*  --  84*  --  73*  APTT 93* 98*   < > 99* 99* 102*  LABPROT 40.3*  --   --  40.0*  --  61.0*  INR 3.9*  --   --  3.9*  --  6.7*  CREATININE 4.05*  --   --  4.58*  --  3.28*   < > = values in this interval not displayed.    Estimated Creatinine Clearance: 48.5 mL/min (A) (by C-G formula based on SCr of 3.28 mg/dL (H)).  Assessment: 38 y.o. male with h/o Afib, Eliquis  on hold for CRRT, for bivalirudin  due to h/o HIT.   aPTT slightly supratherapeutic with uptrend over past 4 readings after dose reduction on 7/1. Patient is now on norepinephrine  at 3 mcg/min and was able to tolerate 1L removal 7/2. Otherwise, CBC worsening with Hgb 6.4 this morning. No note of bleeding or issues with line at this time.   Goal of Therapy:  aPTT: 80-100 sec Monitor platelets by anticoagulation protocol: Yes   Plan:  Decrease bivalrudin to 0.004 mg/kg/hr (0.9 ml/hr) - follow up HD plan as bivalirudin  can be cleared via RRT -- Alaris pump minimum is 0.1 mL/hr for any medication so downtitration is possible if/when needed. Soft minimum in Alaris pump for bivalirudin  infusion is 0.01 mcg/kg/min (already far below this soft minimum).  aPTT BID for now  Randall Call, PharmD, BCPS 01/26/24 7:22 AM  1334: follow up aPTT therapeutic at 91. Repeat per protocol in 4 hours then q12h

## 2024-01-26 NOTE — Progress Notes (Signed)
 Attempted sputum at this time, no success.

## 2024-01-26 NOTE — Progress Notes (Signed)
 OT Cancellation Note  Patient Details Name: Phillip Young MRN: [id number] DOB: 29-Apr-1986   Cancelled Treatment:    Reason Eval/Treat Not Completed: Medical issues which prohibited therapy pt with continued medical instability, decreased responsiveness and decreased engagement with therapy given tenuous medical status. Will sign off for acute OT at this time. Please reconsult if needs change.   Mliss Fish 01/26/2024, 1:27 PM

## 2024-01-26 NOTE — TOC Progression Note (Signed)
 Transition of Care [facility]) - Progression Note    Patient Details  Name: Phillip Young MRN: [id number] Date of Birth: 30-May-1986  [facility]) CM/SW Contact  Lauraine FORBES Saa, LCSW Phone Number: 01/26/2024, 10:59 AM  Clinical Narrative:     10:59 AM TOC continues to follow.  Expected Discharge Plan: Long Term Acute Care (LTAC) Barriers to Discharge: Continued Medical Work up  Expected Discharge Plan and Services       Living arrangements for the past 2 months: Apartment                             HH Agency: [facility]         Social Determinants of Health (SDOH) Interventions SDOH Screenings   Food Insecurity: No Food Insecurity (01/08/2024)  Recent Concern: Food Insecurity - Food Insecurity Present (11/01/2023)  Housing: High Risk (01/08/2024)  Transportation Needs: Unmet Transportation Needs (01/08/2024)  Utilities: At Risk (01/08/2024)  Alcohol  Screen: Low Risk  (10/06/2023)  Depression (PHQ2-9): High Risk (10/06/2023)  Financial Resource Strain: Low Risk  (10/06/2023)  Physical Activity: Insufficiently Active (10/06/2023)  Social Connections: Socially Isolated (10/06/2023)  Stress: No Stress Concern Present (10/06/2023)  Tobacco Use: Medium Risk (12/14/2023)  Health Literacy: Adequate Health Literacy (10/06/2023)    Readmission Risk Interventions    12/15/2023    2:07 PM 10/13/2023   11:27 AM 09/23/2023    5:05 PM  Readmission Risk Prevention Plan  Transportation Screening Complete Complete Complete  Medication Review (RN Care Manager) Referral to Pharmacy Referral to Pharmacy Referral to Pharmacy  PCP or Specialist appointment within 3-5 days of discharge Complete  Complete  HRI or Home Care Consult Complete Complete Complete  SW Recovery Care/Counseling Consult Complete Complete Complete  Palliative Care Screening Not Applicable Not Applicable Not Applicable  Skilled Nursing Facility Not Applicable Not Applicable Not Applicable

## 2024-01-26 NOTE — Telephone Encounter (Signed)
 Called patient -mom answered and states that Phillip Young is in the hospital. Mom requested for someone to return call to her wanting doctor to order to have her som removed to another site. Will have nurse to call her back

## 2024-01-26 NOTE — Progress Notes (Signed)
 PHARMACY - ANTICOAGULATION  Pharmacy Consult for bivalirudin    Indication: atrial fibrillation w/ hx of HIT   Patient Measurements: Height: 6' (182.9 cm) Weight: (!) 164.3 kg (362 lb 3.5 oz) IBW/kg (Calculated) : 77.6 HEPARIN  DW (KG): 125.9  Vital Signs: Temp: 96.7 F (35.9 C) (07/03 1547) Temp Source: Axillary (07/03 1547) BP: 76/49 (07/03 1630) Pulse Rate: 88 (07/03 1700)  Labs: Recent Labs    01/24/24 0411 01/24/24 1306 01/25/24 0500 01/25/24 2035 01/26/24 0524 01/26/24 0944 01/26/24 1300 01/26/24 1426 01/26/24 1456 01/26/24 1610  HGB 8.2*   < > 7.0*  --  6.4* 8.5*  --   --  6.8* 4.7*  HCT 24.0*   < > 20.3*  --  18.3* 25.0*  --   --  20.0* 14.0*  PLT 80*   < > 84*  --  73*  --   --   --   --  59*  APTT 93*   < > 99*   < > 102*  --  91* 101*  --   --   LABPROT 40.3*  --  40.0*  --  61.0*  --   --   --   --   --   INR 3.9*  --  3.9*  --  6.7*  --   --   --   --   --   CREATININE 4.05*  --  4.58*  --  3.28*  --   --   --   --  3.17*   < > = values in this interval not displayed.    Estimated Creatinine Clearance: 50.2 mL/min (A) (by C-G formula based on SCr of 3.17 mg/dL (H)).  Assessment: 38 y.o. male with h/o Afib, Eliquis  on hold for CRRT, for bivalirudin  due to h/o HIT.   aPTT 101. Hb 4.7. Discussed with CCM.   Goal of Therapy:  aPTT: 80-100 sec Monitor platelets by anticoagulation protocol: Yes   Plan:  Decrease bivalrudin to 0.003 mg/kg/hr (0.7 ml/hr) - Discussed with CCM, this will be resumed tomorrow AM. Bivalirudin  on hold for now.   -- Alaris pump minimum is 0.1 mL/hr for any medication so downtitration is possible if/when needed. Soft minimum in Alaris pump for bivalirudin  infusion is 0.01 mcg/kg/min (already far below this soft minimum).  aPTT BID for now  Rankin Sams, PharmD, BCPS, BCCCP Clinical Pharmacist

## 2024-01-26 NOTE — Progress Notes (Signed)
   01/26/24 1500  Spiritual Encounters  Type of Visit Initial  Care provided to: Family  Reason for visit Code  OnCall Visit Yes    Chaplain was paged to code blue. His mother was at the bedside. She stated that she is fine now. Chaplain will be available if needed.   M.Kubra Susanna Kerry Resident [phone]

## 2024-01-27 DIAGNOSIS — D72829 Elevated white blood cell count, unspecified: Secondary | ICD-10-CM | POA: Diagnosis not present

## 2024-01-27 DIAGNOSIS — I5082 Biventricular heart failure: Secondary | ICD-10-CM | POA: Diagnosis not present

## 2024-01-27 DIAGNOSIS — R579 Shock, unspecified: Secondary | ICD-10-CM | POA: Diagnosis not present

## 2024-01-27 LAB — PROTIME-INR
INR: >10 (ref 0.8–1.2)
Prothrombin Time: 87.7 s — ABNORMAL HIGH (ref 11.4–15.2)

## 2024-01-27 LAB — COMPREHENSIVE METABOLIC PANEL WITH GFR
ALT: 28 U/L (ref 0–44)
AST: 44 U/L — ABNORMAL HIGH (ref 15–41)
Albumin: 1.7 g/dL — ABNORMAL LOW (ref 3.5–5.0)
Alkaline Phosphatase: 148 U/L — ABNORMAL HIGH (ref 38–126)
Anion gap: 25 — ABNORMAL HIGH (ref 5–15)
BUN: 59 mg/dL — ABNORMAL HIGH (ref 6–20)
CO2: 18 mmol/L — ABNORMAL LOW (ref 22–32)
Calcium: 8.3 mg/dL — ABNORMAL LOW (ref 8.9–10.3)
Chloride: 90 mmol/L — ABNORMAL LOW (ref 98–111)
Creatinine, Ser: 4.22 mg/dL — ABNORMAL HIGH (ref 0.61–1.24)
GFR, Estimated: 18 mL/min — ABNORMAL LOW (ref >60)
Glucose, Bld: 140 mg/dL — ABNORMAL HIGH (ref 70–99)
Potassium: 4.9 mmol/L (ref 3.5–5.1)
Sodium: 133 mmol/L — ABNORMAL LOW (ref 135–145)
Total Bilirubin: 21.3 mg/dL (ref 0.0–1.2)
Total Protein: 4.7 g/dL — ABNORMAL LOW (ref 6.5–8.1)

## 2024-01-27 LAB — APTT: aPTT: 92 s — ABNORMAL HIGH (ref 24–36)

## 2024-01-27 LAB — CBC WITH DIFFERENTIAL/PLATELET
Abs Immature Granulocytes: 0 K/uL (ref 0.00–0.07)
Basophils Absolute: 0 K/uL (ref 0.0–0.1)
Basophils Relative: 0 %
Eosinophils Absolute: 0 K/uL (ref 0.0–0.5)
Eosinophils Relative: 0 %
HCT: 18.5 % — ABNORMAL LOW (ref 39.0–52.0)
Hemoglobin: 6.7 g/dL — CL (ref 13.0–17.0)
Lymphocytes Relative: 0 %
Lymphs Abs: 0 K/uL — ABNORMAL LOW (ref 0.7–4.0)
MCH: 31.2 pg (ref 26.0–34.0)
MCHC: 36.2 g/dL — ABNORMAL HIGH (ref 30.0–36.0)
MCV: 86 fL (ref 80.0–100.0)
Monocytes Absolute: 0 K/uL — ABNORMAL LOW (ref 0.1–1.0)
Monocytes Relative: 0 %
Neutro Abs: 44.3 K/uL — ABNORMAL HIGH (ref 1.7–7.7)
Neutrophils Relative %: 100 %
Platelets: 85 K/uL — ABNORMAL LOW (ref 150–400)
RBC: 2.15 MIL/uL — ABNORMAL LOW (ref 4.22–5.81)
RDW: 22 % — ABNORMAL HIGH (ref 11.5–15.5)
WBC: 44.3 K/uL — ABNORMAL HIGH (ref 4.0–10.5)
nRBC: 0.5 % — ABNORMAL HIGH (ref 0.0–0.2)
nRBC: 1 /100{WBCs} — ABNORMAL HIGH

## 2024-01-27 LAB — GLUCOSE, CAPILLARY
Glucose-Capillary: 119 mg/dL — ABNORMAL HIGH (ref 70–99)
Glucose-Capillary: 125 mg/dL — ABNORMAL HIGH (ref 70–99)
Glucose-Capillary: 119 mg/dL — ABNORMAL HIGH (ref 70–99)
Glucose-Capillary: 85 mg/dL (ref 70–99)
Glucose-Capillary: 75 mg/dL (ref 70–99)
Glucose-Capillary: 69 mg/dL — ABNORMAL LOW (ref 70–99)

## 2024-01-27 LAB — LACTIC ACID, PLASMA: Lactic Acid, Venous: 5.1 mmol/L (ref 0.5–1.9)

## 2024-01-27 MED ORDER — ORAL CARE MOUTH RINSE
15.0000 mL | OROMUCOSAL | Status: DC
  Administered 2024-01-27 – 2024-01-28 (×13): 15 mL via OROMUCOSAL

## 2024-01-27 MED ORDER — ORAL CARE MOUTH RINSE: 15.0000 mL | OROMUCOSAL | Status: DC | PRN

## 2024-01-27 MED ORDER — DEXTROSE 50 % IV SOLN
12.5000 g | Freq: Once | INTRAVENOUS | Status: AC
  Administered 2024-01-27: 12.5 g via INTRAVENOUS

## 2024-01-27 NOTE — Progress Notes (Signed)
 NAME:  Phillip Young, MRN:  [id number], DOB:  1986-03-24, LOS: 24 ADMISSION DATE:  01/03/2024, CONSULTATION DATE:  01/03/24 REFERRING MD:  Dr. Patsey, CHIEF COMPLAINT:  SOB/ chills   History of Present Illness:  8 yoM w/PMH as below significant for biventricular HF (EF < 20%), chronic afib on Eliquis , chronic hypotension on midodrine  30mg  TID, ESRD, chronic hypoxic and hypercarbic respiratory failure on NIV, morbid obesity, medical non-compliance, and chronic pain presenting from home after being discharged from [facility] 6/9 with SOB, chills, subjective fever, ongoing poor PO, vomiting today strings of blood but then reported it was with coughing episodes, pain all over, and increased weeping from left thigh blister.  Had BM, unclear on consistency/ color.  Reports last dialysis yesterday.  Initially reports not taking any PO meds today but then said he did this morning.  Non- ambulatory at baseline.  Frequent admits, most recently 5/13-5/22 for recurrent septic shock and MRSE bacteremia due to suspected line complicated by acute on chronic HF and respiratory failure, suspected IE but not a candidate for TEE, discharged on vancomycin  with HD scheduled thru 6/30.  TDC was replaced 5/22 after 2 day line holiday.  Also started on micafungin  w/HD at [facility] after [facility] grew candida species, scheduled thru 6/30.   In ER, temp 99.1, on HFNC at 6L, with SBP into 70's requiring NE support after 500 ml bolus.  Labs significant for Na 129, K 6.3, Cl 89, alk phos 346, AST 84, t.bili 12 (recent 3.4 on 5/14), lactic 3.8, WBC 20.6 (previously 8.1 on 5/22), INR 2.9, CXR showing vascular congestion and increasing congestion and pleural effusions.  BC sent and empirically started on linezolid , flagyl , and cefepime .  Pending CT chest/abd/ pelvis.  Nephrology consulted.  PCCM called for admit.   Pertinent  Medical History   Past Medical History:  Diagnosis Date   Acute on chronic respiratory failure with hypoxia ([name])  04/21/2021   Acute on chronic systolic (congestive) heart failure ([name]) 02/26/2020   Amiodarone  toxicity    Anemia    Biventricular congestive heart failure ([name])    Candida parapsilosis infection 12/26/2023   Chronic hypoxemic respiratory failure ([name])    Class 3 severe obesity due to excess calories with serious comorbidity and body mass index (BMI) of 50.0 to 59.9 in adult 02/26/2020   ESRD on hemodialysis [facility])    Essential hypertension 02/26/2020   GERD without esophagitis 02/26/2020   Hidradenitis suppurativa 02/26/2020   NICM (nonischemic cardiomyopathy) ([name])    Obesity hypoventilation syndrome ([name])    OSA (obstructive sleep apnea)    Pneumonia    Prediabetes 02/26/2020   Significant Hospital Events: Including procedures, antibiotic start and stop dates in addition to other pertinent events   6/10 Admit 6/12 Remains on vasopressors.  Spoke to heart failure.  Not a candidate for advanced therapies.  Not a candidate for tailored hemodynamics.  Recommend a strategy of vasopressor support at fluid removal in an attempt to transition back to IHD. 6/15 patient brought up discussion of limiting some of his care; Palliative re-consulted.  6/16 palliative consulted per patient's request. CT unrevealing for cause of abdominal pain. 6/23 nephro decided to restart CRRT despite prior nephro stopping. Remains hypotensive, mother did not consent central line, PICC delayed, peripheral pressors and resume CRRT, developed ileus vs SBO, NG placed to [facility] 6/24 PICC could not be placed, mother refused central line again 6/25 central line placed 6/26 CRRT stopped 6/27 on low dose levophed  6/28 on levophed  3-37mcg/min, tolerated iHD  6/29 increasing levophed  requirements 6/30 remained on levophed  above , arterial line declined 6/31 remained on levophed , not able to do iHD due to elevated pressor requirements 7/2 received dialysis, 1L removed. MAP goal changed to 45. 7/3 ethics meeting, cardiac  arrest post ACLS and intubation  Interim History / Subjective:   Patient intubated and sedated  Mother at bedside, updates provided.   Objective    Blood pressure (!) 53/19, pulse 76, temperature 99.3 F (37.4 C), temperature source Axillary, resp. rate (!) 26, height 6' 0.01 (1.829 m), weight (!) 164.3 kg, SpO2 100%.    Vent Mode: PRVC FiO2 (%):  [60 %] 60 % Set Rate:  [26 bmp] 26 bmp Vt Set:  [620 mL] 620 mL PEEP:  [8 cmH20] 8 cmH20 Pressure Support:  [8 cmH20] 8 cmH20 Plateau Pressure:  [19 cmH20-26 cmH20] 19 cmH20   Intake/Output Summary (Last 24 hours) at 01/27/2024 0934 Last data filed at 01/27/2024 9266 Gross per 24 hour  Intake 2543.02 ml  Output --  Net 2543.02 ml   Filed Weights   01/19/24 0500 01/20/24 0600 01/21/24 0400  Weight: (!) 162.6 kg (!) 163 kg (!) 164.3 kg   Examination: Chronically ill appearing obese man, laying in bed, intubated /AT, ET tube in place, moist oral mucosa RRR Breath and lung sounds diminished, No wheeze Abdomen obese, soft, non-tender Non-pitting peripheral edema Sedated, PERRL   Resolved problem list:  SBO vs Ileus on plain film 6/23 (resolved)  Assessment and Plan  Shock ([name])> suspect primarily cardiogenic Continue vasopressor support with Levophed , MAP goal 45 or greater Meropenem , micafungin , linezolid  for empiric coverage, procalcicontin is elevated - trending down Not a candidate for advanced heart failure therapy, reached out to heartfailure team 7/1 - no further recommendations Central line placed 6/25, arterial line placed 7/1 after approval by mother Continue midodrine  per tube Trend lactic acid and Coox   Acute Hypoxemic Respiratory Failure Obesity hypoventilation syndrome  Continue mechanical ventilatory support Fentanyl  precedex  for sedation Check tracheal aspirate  Acute Metabolic Encephalopathy Uremia vs shock vs hepatic  iHD completed 7/2 without improvement in mental status Ammonia 77, lactulose  BID  ordered  Leukocytosis: Rising WBC, pressors up and down, no fever, unable to remove central line with ongoing pressor need. Vanc course end 6/30 Continue empiric meropenem /linezolid /micafungin  Trend procalcitonin  Anemia Offered blood transfusion but refused by mother per patient's  Hyperbilirubinemia Coagulopathy -- POA, congestive vs low forward flow state. 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 No signs of obstruction.  Acaculous cholecystitis is possible> no suggestion of this on any of his imaging. Patent hepatic portal veins so no Budd Chiari. Low suspicion for DILI with normal transaminases and no obvious culprit.  Continue trial of ursodiol   Acute on chronic biventricular congestive heart failure ([name])-combined sysolic & diastolic; known NICM. Echo shows severe RV and LV failure with EF <20%. Non-compliant for many years. Per advanced heart failure, patient is not a candidate for advanced therapy Unable to tolerate GDMT in the setting of hypotension CRRT resumed time limited trial 6/23 for 3 days, ended AM 6/25 Unable to tolerate iHD due to hemodynamics  ESRD on hemodialysis CRRT was stopped, time limited trial again after discussion 6/23 and ended 6/26 He had iHD 6/29 AM and 7/2. MAP goal adjusted in order to get to levophed  goal  in order to attempt iHD.  Nephrology following, not candidate for iHD at this time  Obesity, BMI 49 Moderate Protein Calorie Malnutrition - feeding supplement as tolerated - nutrition evaluation - cortrak in place, continue tube feeds  Permanent atrial fibrillation  Amiodarone  intolerant Heart rate is controlled Bival stopped Due to hyperbilirubinemia, can not switch him to Eliquis   Digoxin  repeat dose 6/29 - will hold addl  doses unable to have iHD  Chronic pain disorder Continue pregabalin  On fenatnyl drip  Mild hyperammonemia Lactulose  BID ordered  Skin ulceration  Has chronic skin ulceration Continue wound care  Long term prognosis remains grim. No good options if he is not able to tolerate iHD.  Most likely outcome is in hospital death. GOC remain full code yet at times refuse appropriate and aggressive medical care as recommended. Ethics committee consulted 7/3. Not a candidate for transfer to other medical systems.  Best Practice (right click and Reselect all SmartList Selections daily)   Diet/type: tubefeeds DVT prophylaxis SCD Pressure ulcer(s): present on admission  GI prophylaxis: PPI Lines: Central line, Dialysis Catheter, and Arterial Line  Foley:  N/A Code Status:  full code Last date of multidisciplinary goals of care discussion - updated mother at bedside today  CRITICAL CARE Performed by: Dorn KATHEE Chill   Total critical care time: 45 minutes  Critical care time was exclusive of separately billable procedures and treating other patients.  Critical care was necessary to treat or prevent imminent or life-threatening deterioration.  Critical care was time spent personally by me on the following activities: development of treatment plan with patient and/or surrogate as well as nursing, discussions with consultants, evaluation of patient's response to treatment, examination of patient, obtaining history from patient or surrogate, ordering and performing treatments and interventions, ordering and review of laboratory studies, ordering and review of radiographic studies, pulse oximetry and re-evaluation of patient's condition.      Dorn Chill, MD  Pulmonary & Critical Care Office: [phone]   See Amion for personal pager PCCM on call pager [phone] until 7pm. Please call Elink 7p-7a. [phone]

## 2024-01-27 NOTE — Plan of Care (Signed)
 Patient remains highly unstable today.  Maxed on ordered pressures.  Bleeding oral and from IV sights. CCM aware.

## 2024-01-27 NOTE — Procedures (Signed)
 HD note:  Verbal orders to discontinue HD orders for today related to patient instability.

## 2024-01-27 NOTE — Progress Notes (Signed)
 Brief nephrology note  Patient with difficult day yesterday with CODE BLUE was able to achieve ROSC.  Persistent severe illness overnight with high-dose norepinephrine  required.  MAP very low.  Concerned that he may expire throughout the day.  Not able to tolerate dialysis at this time.  We will follow peripherally at this time but are available if the patient's clinical status changes.  Details requiring candidacy for further hemodialysis outlined in note on 7/3

## 2024-01-28 DIAGNOSIS — I469 Cardiac arrest, cause unspecified: Secondary | ICD-10-CM | POA: Diagnosis not present

## 2024-01-28 DIAGNOSIS — I5082 Biventricular heart failure: Secondary | ICD-10-CM | POA: Diagnosis not present

## 2024-01-28 DIAGNOSIS — D72829 Elevated white blood cell count, unspecified: Secondary | ICD-10-CM | POA: Diagnosis not present

## 2024-01-28 DIAGNOSIS — R579 Shock, unspecified: Secondary | ICD-10-CM | POA: Diagnosis not present

## 2024-01-28 LAB — POCT I-STAT 7, (LYTES, BLD GAS, ICA,H+H)
Acid-base deficit: 14 mmol/L — ABNORMAL HIGH (ref 0.0–2.0)
Bicarbonate: 11.1 mmol/L — ABNORMAL LOW (ref 20.0–28.0)
Calcium, Ion: 1.02 mmol/L — ABNORMAL LOW (ref 1.15–1.40)
HCT: 18 % — ABNORMAL LOW (ref 39.0–52.0)
Hemoglobin: 6.1 g/dL — CL (ref 13.0–17.0)
O2 Saturation: 100 %
Patient temperature: 98.8
Potassium: 5.3 mmol/L — ABNORMAL HIGH (ref 3.5–5.1)
Sodium: 128 mmol/L — ABNORMAL LOW (ref 135–145)
TCO2: 12 mmol/L — ABNORMAL LOW (ref 22–32)
pCO2 arterial: 21.3 mmHg — ABNORMAL LOW (ref 32–48)
pH, Arterial: 7.328 — ABNORMAL LOW (ref 7.35–7.45)
pO2, Arterial: 521 mmHg — ABNORMAL HIGH (ref 83–108)

## 2024-01-28 LAB — CBC WITH DIFFERENTIAL/PLATELET
Abs Immature Granulocytes: 2.27 K/uL — ABNORMAL HIGH (ref 0.00–0.07)
Basophils Absolute: 0.1 K/uL (ref 0.0–0.1)
Basophils Relative: 0 %
Eosinophils Absolute: 0.1 K/uL (ref 0.0–0.5)
Eosinophils Relative: 0 %
HCT: 19.1 % — ABNORMAL LOW (ref 39.0–52.0)
Hemoglobin: 6.1 g/dL — CL (ref 13.0–17.0)
Immature Granulocytes: 6 %
Lymphocytes Relative: 5 %
Lymphs Abs: 2 K/uL (ref 0.7–4.0)
MCH: 30.2 pg (ref 26.0–34.0)
MCHC: 31.9 g/dL (ref 30.0–36.0)
MCV: 94.6 fL (ref 80.0–100.0)
Monocytes Absolute: 0.9 K/uL (ref 0.1–1.0)
Monocytes Relative: 2 %
Neutro Abs: 32.2 K/uL — ABNORMAL HIGH (ref 1.7–7.7)
Neutrophils Relative %: 87 %
Platelets: 61 K/uL — ABNORMAL LOW (ref 150–400)
RBC: 2.02 MIL/uL — ABNORMAL LOW (ref 4.22–5.81)
RDW: 22.4 % — ABNORMAL HIGH (ref 11.5–15.5)
Smear Review: NORMAL
WBC: 37.5 K/uL — ABNORMAL HIGH (ref 4.0–10.5)
nRBC: 1 % — ABNORMAL HIGH (ref 0.0–0.2)

## 2024-01-28 LAB — GLUCOSE, CAPILLARY
Glucose-Capillary: 101 mg/dL — ABNORMAL HIGH (ref 70–99)
Glucose-Capillary: 13 mg/dL — CL (ref 70–99)
Glucose-Capillary: 180 mg/dL — ABNORMAL HIGH (ref 70–99)
Glucose-Capillary: 50 mg/dL — ABNORMAL LOW (ref 70–99)
Glucose-Capillary: 76 mg/dL (ref 70–99)
Glucose-Capillary: 86 mg/dL (ref 70–99)
Glucose-Capillary: <10 mg/dL — CL (ref 70–99)
Glucose-Capillary: <10 mg/dL — CL (ref 70–99)

## 2024-01-28 LAB — PROTIME-INR
INR: 9.5 (ref 0.8–1.2)
Prothrombin Time: 80.3 s — ABNORMAL HIGH (ref 11.4–15.2)

## 2024-01-28 LAB — PROCALCITONIN: Procalcitonin: 29.38 ng/mL

## 2024-01-28 MED ORDER — SODIUM ZIRCONIUM CYCLOSILICATE 10 G PO PACK
10.0000 g | PACK | Freq: Once | ORAL | Status: AC
  Administered 2024-01-28: 10 g
  Filled 2024-01-28: qty 1

## 2024-01-28 MED ORDER — DEXTROSE 50 % IV SOLN
INTRAVENOUS | Status: AC
  Filled 2024-01-28: qty 50

## 2024-01-28 MED ORDER — FENTANYL CITRATE PF 50 MCG/ML IJ SOSY: 50.0000 ug | PREFILLED_SYRINGE | Freq: Once | INTRAMUSCULAR | Status: DC

## 2024-01-28 MED ORDER — DEXTROSE 50 % IV SOLN
INTRAVENOUS | Status: AC
  Administered 2024-01-28: 100 mL via INTRAVENOUS
  Filled 2024-01-28: qty 50

## 2024-01-28 MED ORDER — DEXTROSE 50 % IV SOLN: 2.0000 | Freq: Once | INTRAVENOUS | Status: AC

## 2024-01-28 MED ORDER — DEXTROSE 50 % IV SOLN
25.0000 g | Freq: Once | INTRAVENOUS | Status: AC
  Administered 2024-01-28: 25 g via INTRAVENOUS

## 2024-01-28 MED ORDER — DEXTROSE 10 % IV SOLN: INTRAVENOUS | Status: DC

## 2024-01-29 LAB — COMPREHENSIVE METABOLIC PANEL WITH GFR
ALT: 27 U/L (ref 0–44)
AST: 66 U/L — ABNORMAL HIGH (ref 15–41)
Albumin: <1.5 g/dL — ABNORMAL LOW (ref 3.5–5.0)
Alkaline Phosphatase: 142 U/L — ABNORMAL HIGH (ref 38–126)
BUN: 73 mg/dL — ABNORMAL HIGH (ref 6–20)
CO2: <7 mmol/L — ABNORMAL LOW (ref 22–32)
Calcium: 9.2 mg/dL (ref 8.9–10.3)
Chloride: 93 mmol/L — ABNORMAL LOW (ref 98–111)
Creatinine, Ser: 4.8 mg/dL — ABNORMAL HIGH (ref 0.61–1.24)
GFR, Estimated: 15 mL/min — ABNORMAL LOW (ref >60)
Glucose, Bld: <20 mg/dL — CL (ref 70–99)
Potassium: 6.7 mmol/L (ref 3.5–5.1)
Sodium: 133 mmol/L — ABNORMAL LOW (ref 135–145)
Total Bilirubin: 18.4 mg/dL (ref 0.0–1.2)
Total Protein: 4.4 g/dL — ABNORMAL LOW (ref 6.5–8.1)

## 2024-01-30 LAB — PATHOLOGIST SMEAR REVIEW

## 2024-02-18 MED FILL — Medication: Qty: 1 | Status: AC

## 2024-02-24 NOTE — Code Documentation (Addendum)
 Code Blue Note:  Patient went into cardiac arrest at 10:21. Initial rhythm was PEA. ACLS was initiated immediately. 5 total round of CPR completed with PEA activity. No organized rhythm noted after each subsequent pulse checks. Epinephrine  was given every 2 minutes. Pushes of bicarb were given.   [name] was on phone during the code. Discussed wither her that we were not getting pulses back. Patient had severe metabolic derangements that are uncorrectable. Decision made to stop resuscitative efforts.  Patient passed away at 10:31am. [name] notified on phone.  Dorn Chill, MD [place] [facility] Critical [facility] Office: [phone]   See Amion for personal pager PCCM on call pager [phone] until 7pm. Please call Elink 7p-7a. [phone]

## 2024-02-24 NOTE — Plan of Care (Signed)
  Problem: Education: Goal: Knowledge of General Education information will improve Description: Including pain rating scale, medication(s)/side effects and non-pharmacologic comfort measures Outcome: Not Progressing   Problem: Clinical Measurements: Goal: Ability to maintain clinical measurements within normal limits will improve Outcome: Not Progressing Goal: Respiratory complications will improve Outcome: Not Progressing Goal: Cardiovascular complication will be avoided Outcome: Not Progressing   Problem: Clinical Measurements: Goal: Respiratory complications will improve Outcome: Not Progressing   Problem: Clinical Measurements: Goal: Cardiovascular complication will be avoided Outcome: Not Progressing   Problem: Pain Managment: Goal: General experience of comfort will improve and/or be controlled Outcome: Not Progressing   Problem: Skin Integrity: Goal: Risk for impaired skin integrity will decrease Outcome: Not Progressing

## 2024-02-24 NOTE — Progress Notes (Signed)
 Contacted June, RN/Elink to notify Dr Haze of continued decreasing hypotension despite being on max Levo at this time. Tachypnea, RR 35-40 and Vent alarming. Noted increased accessory muscle use and asymmetrical breathing pattern as well as tears coming down face at times. See new order.

## 2024-02-24 NOTE — Progress Notes (Signed)
   01/29/2024 1038  Spiritual Encounters  Type of Visit Initial  Reason for visit Code  OnCall Visit Yes   Chaplain responded to Code [facility] page while on call. Patient was revived but remains unconscious. No family is present. No spiritual need at this time.  Chaplain Therisa Samuel

## 2024-02-24 NOTE — Progress Notes (Signed)
 NAME:  Phillip Young, MRN:  [id number], DOB:  [birth year], LOS: 25 ADMISSION DATE:  01/03/2024, CONSULTATION DATE:  01/03/24 REFERRING MD:  Dr. Patsey, CHIEF COMPLAINT:  SOB/ chills   History of Present Illness:  23 yoM w/PMH as below significant for biventricular HF (EF < 20%), chronic afib on Eliquis , chronic hypotension on midodrine  30mg  TID, ESRD, chronic hypoxic and hypercarbic respiratory failure on NIV, morbid obesity, medical non-compliance, and chronic pain presenting from home after being discharged from [facility] 6/9 with SOB, chills, subjective fever, ongoing poor PO, vomiting today strings of blood but then reported it was with coughing episodes, pain all over, and increased weeping from left thigh blister.  Had BM, unclear on consistency/ color.  Reports last dialysis yesterday.  Initially reports not taking any PO meds today but then said he did this morning.  Non- ambulatory at baseline.  Frequent admits, most recently 5/13-5/22 for recurrent septic shock and MRSE bacteremia due to suspected line complicated by acute on chronic HF and respiratory failure, suspected IE but not a candidate for TEE, discharged on vancomycin  with HD scheduled thru 6/30.  TDC was replaced 5/22 after 2 day line holiday.  Also started on micafungin  w/HD at [facility] after [facility] grew candida species, scheduled thru 6/30.   In ER, temp 99.1, on HFNC at 6L, with SBP into 70's requiring NE support after 500 ml bolus.  Labs significant for Na 129, K 6.3, Cl 89, alk phos 346, AST 84, t.bili 12 (recent 3.4 on 5/14), lactic 3.8, WBC 20.6 (previously 8.1 on 5/22), INR 2.9, CXR showing vascular congestion and increasing congestion and pleural effusions.  BC sent and empirically started on linezolid , flagyl , and cefepime .  Pending CT chest/abd/ pelvis.  Nephrology consulted.  PCCM called for admit.   Pertinent  Medical History   Past Medical History:  Diagnosis Date   Acute on chronic respiratory failure with hypoxia ([name])  04/21/2021   Acute on chronic systolic (congestive) heart failure ([name]) 02/26/2020   Amiodarone  toxicity    Anemia    Biventricular congestive heart failure ([name])    Candida parapsilosis infection 12/26/2023   Chronic hypoxemic respiratory failure ([name])    Class 3 severe obesity due to excess calories with serious comorbidity and body mass index (BMI) of 50.0 to 59.9 in adult 02/26/2020   ESRD on hemodialysis [facility]    Essential hypertension 02/26/2020   GERD without esophagitis 02/26/2020   Hidradenitis suppurativa 02/26/2020   NICM (nonischemic cardiomyopathy) ([name])    Obesity hypoventilation syndrome ([name])    OSA (obstructive sleep apnea)    Pneumonia    Prediabetes 02/26/2020   Significant Hospital Events: Including procedures, antibiotic start and stop dates in addition to other pertinent events   6/10 Admit 6/12 Remains on vasopressors.  Spoke to heart failure.  Not a candidate for advanced therapies.  Not a candidate for tailored hemodynamics.  Recommend a strategy of vasopressor support at fluid removal in an attempt to transition back to IHD. 6/15 patient brought up discussion of limiting some of his care; Palliative re-consulted.  6/16 palliative consulted per patient's request. CT unrevealing for cause of abdominal pain. 6/23 nephro decided to restart CRRT despite prior nephro stopping. Remains hypotensive, mother did not consent central line, PICC delayed, peripheral pressors and resume CRRT, developed ileus vs SBO, NG placed to [facility] 6/24 PICC could not be placed, mother refused central line again 6/25 central line placed 6/26 CRRT stopped 6/27 on low dose levophed  6/28 on levophed  3-26mcg/min, tolerated iHD  6/29 increasing levophed  requirements 6/30 remained on levophed  above , arterial line declined 6/31 remained on levophed , not able to do iHD due to elevated pressor requirements 7/2 received dialysis, 1L removed. MAP goal changed to 45. 7/3 ethics meeting, cardiac  arrest post ACLS and intubation  Interim History / Subjective:   Patient remains intubated Low blood sugar events this morning, elevated potassium and low serum bicarb levels Increased his analgesia overnight  Objective    Blood pressure (!) 55/31, pulse 84, temperature 98.8 F (37.1 C), temperature source Axillary, resp. rate (!) 23, height 6' (1.829 m), weight (!) 164.3 kg, SpO2 100%.    Vent Mode: PRVC FiO2 (%):  [50 %-100 %] 100 % Set Rate:  [26 bmp] 26 bmp Vt Set:  [379 mL] 620 mL PEEP:  [8 cmH20] 8 cmH20 Plateau Pressure:  [19 cmH20-26 cmH20] 24 cmH20   Intake/Output Summary (Last 24 hours) at 2024/02/04 0724 Last data filed at 02-04-2024 0500 Gross per 24 hour  Intake 3838.21 ml  Output --  Net 3838.21 ml   Filed Weights   01/19/24 0500 01/20/24 0600 01/21/24 0400  Weight: (!) 162.6 kg (!) 163 kg (!) 164.3 kg   Examination: Chronically ill appearing obese man, laying in bed, intubated/agonal respirations /AT, ET tube in place, moist oral mucosa RRR Breath and lung sounds diminished, No wheeze Abdomen obese, soft, non-tender Non-pitting peripheral edema Sedated, PERRL   Resolved problem list:  SBO vs Ileus on plain film 6/23 (resolved)  Assessment and Plan  Shock ([name])> suspect primarily cardiogenic Continue vasopressor support with Levophed , MAP goal 45 or greater Meropenem , micafungin , linezolid  for empiric coverage, procalcicontin is elevated - trending down Not a candidate for advanced heart failure therapy, reached out to heartfailure team 7/1 - no further recommendations Central line placed 6/25, arterial line placed 7/1 after approval by mother Continue midodrine  per tube  Acute Hypoxemic Respiratory Failure Obesity hypoventilation syndrome  Continue mechanical ventilatory support Fentanyl  precedex  for sedation  Acute on chronic biventricular congestive heart failure ([name])-combined sysolic & diastolic; known NICM. Echo shows severe RV and LV failure  with EF <20%. Non-compliant for many years. Per advanced heart failure, patient is not a candidate for advanced therapy Unable to tolerate GDMT in the setting of hypotension CRRT resumed time limited trial 6/23 for 3 days, ended AM 6/25 Unable to tolerate iHD due to hemodynamics  ESRD on hemodialysis Hyperkalemia CRRT was stopped, time limited trial again after discussion 6/23 and ended 6/26 He had iHD 6/29 AM and 7/2. MAP goal adjusted in order to get to levophed  goal in order to attempt iHD.  Nephrology following, not candidate for iHD at this time Give lokelma   Acute Metabolic Encephalopathy Uremia vs shock vs hepatic  iHD completed 7/2 without improvement in mental status Ammonia 77, lactulose  BID ordered  Leukocytosis: Rising WBC, pressors up and down, no fever, unable to remove central line with ongoing pressor need. Vanc course end 6/30 Continue empiric meropenem /linezolid /micafungin   Anemia Patient does not want blood products  Hyperbilirubinemia Coagulopathy -- POA, congestive vs low forward flow state. 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 No signs of obstruction.  Acaculous cholecystitis is possible> no suggestion of this on any of his imaging. Patent hepatic portal veins so no Budd Chiari. Low suspicion for DILI with normal transaminases and no  obvious culprit.  Continue trial of ursodiol   Obesity, BMI 49 Moderate Protein Calorie Malnutrition Hypoglycemia - feeding supplement as tolerated - nutrition evaluation - cortrak in place, continue tube feeds - D50 as needed, D10 at 29mL/hr  Permanent atrial fibrillation  Amiodarone  intolerant Heart rate is controlled Bival stopped Due to hyperbilirubinemia, can not switch him to Eliquis   Digoxin   repeat dose 6/29 - will hold addl doses unable to have iHD  Chronic pain disorder On fenatnyl drip  Mild hyperammonemia Lactulose  BID ordered  Skin ulceration  Has chronic skin ulceration Continue wound care  Patient is actively dying. Mother has been called about his current state and working on coming in to the hospital.  Most likely outcome is in hospital death. GOC remain full code yet at times refuse appropriate and aggressive medical care as recommended. Ethics committee consulted 7/3. Not a candidate for transfer to other medical systems.  Best Practice (right click and Reselect all SmartList Selections daily)   Diet/type: tubefeeds DVT prophylaxis SCD Pressure ulcer(s): present on admission  GI prophylaxis: PPI Lines: Central line, Dialysis Catheter, and Arterial Line  Foley:  N/A Code Status:  full code Last date of multidisciplinary goals of care discussion - updated mother at bedside yesterday  CRITICAL CARE Performed by: Dorn KATHEE Chill   Total critical care time: 35 minutes  Critical care time was exclusive of separately billable procedures and treating other patients.  Critical care was necessary to treat or prevent imminent or life-threatening deterioration.  Critical care was time spent personally by me on the following activities: development of treatment plan with patient and/or surrogate as well as nursing, discussions with consultants, evaluation of patient's response to treatment, examination of patient, obtaining history from patient or surrogate, ordering and performing treatments and interventions, ordering and review of laboratory studies, ordering and review of radiographic studies, pulse oximetry and re-evaluation of patient's condition.      Dorn Chill, MD [place] [facility] Office: [phone]   See Amion for personal pager PCCM on call pager [phone] until 7pm. Please call Elink 7p-7a. [phone]

## 2024-02-24 NOTE — Progress Notes (Signed)
 Observed patient's HR drop on monitor and went to patient's room. Code Blue initiated and chest compressions begun immediately. Multiple providers at bedside to assist with code. See code report sheet for details. Time of death pronounced by MD at 1031. Family en route to hospital.

## 2024-02-24 NOTE — Progress Notes (Addendum)
 eLink Physician-Brief Progress Note Patient Name: Phillip Young DOB: 01-21-1986 MRN: [id number]   Date of Service  2024/02/03  HPI/Events of Note  Patient has end-stage ovarian dysfunction and an cardiac arrest earlier today.  Patient has elevated C pod score and is actively tearing but unfortunately hypotensive at the same time without available therapies.  eICU Interventions  Given the lack of alternative therapies for his disease processes, his tachypnea, tearing, and discomfort should be prioritized-additional fentanyl  at this time.  Risk/benefits favor providing analgesia as no other viable therapy exists   0421 -attempted to call patient's mother, left a message.  No response thus far.  9566 -persistent coagulopathy and anemia.  Transfusions refused by MPOA.  No intervention for now  Intervention Category Minor Interventions: Routine modifications to care plan (e.g. PRN medications for pain, fever)  Phillip Young Feb 03, 2024, 4:00 AM

## 2024-02-24 NOTE — Progress Notes (Signed)
 RT responded to code blue. Patient removed from mechanical ventilator and manually bagged during ACLS. After patient pronounced deceased, ETT removed per MD request.

## 2024-02-24 NOTE — Progress Notes (Signed)
 At time of patient's passing, MD gave verbal order to RT to remove ETT and NG tube as patient is not an ME's case. ETT tube removed by RT and NG removed by this RN. At 1234 this RN was notified by family members of their wish to pursue autopsy. All IV lines and fluids remain in place at this time.

## 2024-02-24 NOTE — Progress Notes (Signed)
 Late Note Entry- January 30, 2024  Contacted [facility] this morning to be advised of pt's passing.   Randine Mungo Renal Navigator [phone]

## 2024-02-24 NOTE — Death Summary Note (Signed)
 DEATH SUMMARY   Patient Details  Name: Phillip Young MRN: [id number] DOB: 10/30/1985  Admission/Discharge Information   Admit Date:  2024-01-16  Date of Death: Date of Death: February 10, 2024  Time of Death: Time of Death: February 18, 1030  Length of Stay: Mar 01, 2024  Referring Physician: [name], DO   Reason(s) for Hospitalization  Shock  Diagnoses  Preliminary cause of death:  Acute on Chronic Systolic Heart Failure  Secondary Diagnoses (including complications and co-morbidities):  Principal Problem:   Shock ([name]) Active Problems:   Biventricular congestive heart failure ([name])   ESRD on hemodialysis ([name])   Obesity hypoventilation syndrome ([name])   Permanent atrial fibrillation ([name])   Chronic pain disorder   Skin ulceration ([name])   Sepsis ([name]) Acute Hypoxemic Respiratory Failure Hyperkalemia Acute Metabolic Encephalopathy Anemia of Chronic Disease Obesity, BMI 49 Moderate Protein Calorie Malnutrition Hyperbilirubinemia Coagulopathy Skin Ulceration  Brief Hospital Course (including significant findings, care, treatment, and services provided and events leading to death)  Phillip Young is a 38 y.o. year old male with biventricular HF (EF < 20%), chronic afib on Eliquis , chronic hypotension on midodrine  30mg  TID, ESRD, chronic hypoxic and hypercarbic respiratory failure on NIV, morbid obesity, and chronic pain presenting from home after being discharged from [facility] 6/9 with SOB, chills, subjective fever, ongoing poor PO, vomiting today strings of blood but then reported it was with coughing episodes, pain all over, and increased weeping from left thigh blister. He was admitted to the ICU for shock and started on vasopressor support and broad spectrum antibiotics. He had elevated bilirubin level on admission concerning for new onset liver failure due to hepatic congestion from heart failure. The bilirubin level continued to rise throughout the hospitalization. Patient had acute on  chronic multiorgan failure which ultimately led to his death.  Summary of Hospital Course by Date: 01-16-24 Admit 6/12 Remains on vasopressors.  Spoke to heart failure.  Not a candidate for advanced therapies.  Not a candidate for tailored hemodynamics.  Recommend a strategy of vasopressor support at fluid removal in an attempt to transition back to IHD. 6/15 patient brought up discussion of limiting some of his care; Palliative re-consulted.  6/16 palliative consulted per patient's request. CT unrevealing for cause of abdominal pain. 6/23 nephro decided to restart CRRT despite prior nephro stopping. Remains hypotensive, mother did not consent central line, PICC delayed, peripheral pressors and resume CRRT, developed ileus vs SBO, NG placed to [facility] 6/24 PICC could not be placed, mother refused central line again 6/25 central line placed 6/26 CRRT stopped 6/27 on low dose levophed  6/28 on levophed  3-30mcg/min, tolerated iHD 6/29 increasing levophed  requirements 6/30 remained on levophed  above , arterial line declined 6/31 remained on levophed , not able to do iHD due to elevated pressor requirements 7/2 received dialysis, 1L removed. MAP goal changed to 45. 7/3 ethics meeting, cardiac arrest post ACLS and intubation 7/4 patient maxed out on levophed  , not a candidated for iHD 02-10-24 PEA cardiac arrest - s/p 5 rounds of ACLS. Passed away at 10:31am.    Pertinent Labs and Studies  Significant Diagnostic Studies DG CHEST PORT 1 VIEW Result Date: 01/26/2024 CLINICAL DATA:  Endotracheally intubated. EXAM: PORTABLE CHEST 1 VIEW COMPARISON:  01/18/2024 FINDINGS: The endotracheal tube tip is 4.1 cm from the carina. Right-sided dialysis catheter tip in the right atrium. Weighted enteric tube tip below the diaphragm not included in the field of view. Left-sided central line tip is obscured on the current exam. Cardiomegaly is again seen. Increase in perihilar opacity  in the left lung with central air  bronchograms. Mild improvement in right lung opacities. There may be a left pleural effusion. No visible pneumothorax. IMPRESSION: 1. Endotracheal tube tip 4.1 cm from the carina. 2. Increase in perihilar opacity in the left lung with central air bronchograms. Mild improvement in right lung opacities. This may represent asymmetric edema or pneumonia. 3. Cardiomegaly. Electronically Signed   By: Andrea Gasman M.D.   On: 01/26/2024 16:58   CT CHEST ABDOMEN PELVIS WO CONTRAST Result Date: 01/24/2024 CLINICAL DATA:  Leukocytosis EXAM: CT CHEST, ABDOMEN AND PELVIS WITHOUT CONTRAST TECHNIQUE: Multidetector CT imaging of the chest, abdomen and pelvis was performed following the standard protocol without IV contrast. RADIATION DOSE REDUCTION: This exam was performed according to the departmental dose-optimization program which includes automated exposure control, adjustment of the mA and/or kV according to patient size and/or use of iterative reconstruction technique. COMPARISON:  Chest and abdominal radiograph 01/18/2024; CT abdomen pelvis 01/09/2024 and CT chest abdomen pelvis 01/03/2024 FINDINGS: CT CHEST FINDINGS Cardiovascular: Cardiomegaly. Right IJ CVC tip in the right atrium. Left IJ CVC tip in the right atrium. No pericardial effusion. Normal caliber thoracic aorta. Mild aortic atherosclerotic calcification. Mediastinum/Nodes: Trachea and esophagus are unremarkable. No thoracic adenopathy noting limitations of noncontrast exam. Lungs/Pleura: Diffuse bilateral ground-glass opacities, edema versus infection. No pleural effusion. No pneumothorax. Musculoskeletal: No acute fracture. CT ABDOMEN PELVIS FINDINGS Hepatobiliary: No acute abnormality. Pancreas: Unremarkable. Spleen: Unremarkable. Adrenals/Urinary Tract: Normal adrenal glands. No urinary calculi or hydronephrosis. Nondistended bladder. Stomach/Bowel: Normal caliber large and small bowel. Mild wall thickening of the small bowel and colon. Adjacent fluid  about the sigmoid colon. Engorgement of the Vasa recta of the small bowel with adjacent mesenteric edema. The appendix is normal. Stomach is within normal limits. Vascular/Lymphatic: Mild aortic atherosclerotic calcification. No lymphadenopathy. Reproductive: No acute abnormality. Other: No intraperitoneal abscess or air. Stranding in the proximal bilateral thighs greatest along the anteromedial right thigh. Correlate for cellulitis. No soft tissue gas or abscess. Musculoskeletal: No acute fracture. IMPRESSION: 1. Diffuse bilateral ground-glass opacities, edema versus infection. 2. Mild wall thickening of the small bowel and colon with adjacent fluid about the sigmoid colon. Findings are suggestive of enterocolitis. Alternatively this could represent vascular congestion and third-spacing of fluid due to heart failure. 3. Stranding in the proximal bilateral thighs greatest along the anteromedial right thigh. Correlate for cellulitis. No soft tissue gas or abscess. 4. Cardiomegaly. 5. Aortic Atherosclerosis (ICD10-I70.0). Electronically Signed   By: Norman Gatlin M.D.   On: 01/24/2024 00:00   DG Abd 1 View Result Date: 01/18/2024 CLINICAL DATA:  [id number] Small bowel obstruction [facility]) [id number] EXAM: ABDOMEN - 1 VIEW COMPARISON:  01/17/2024 FINDINGS: Decompressed stomach with an esophagogastric tube in place. Similar gaseous distension throughout the small bowel in the central abdomen. Scattered gas also noted in the colon. No pneumoperitoneum. No organomegaly or radiopaque calculi. No acute fracture or destructive lesion. The lung bases are clear. IMPRESSION: Similar gaseous distension of the small bowel and colon, which could represent changes of an adynamic ileus versus partial small bowel obstruction. Well-positioned esophagogastric tube terminating in the stomach. Electronically Signed   By: Rogelia Myers M.D.   On: 01/18/2024 16:36   DG CHEST PORT 1 VIEW Result Date: 01/18/2024 CLINICAL DATA:  Central venous  line placement. EXAM: PORTABLE CHEST 1 VIEW COMPARISON:  Chest radiograph dated 01/03/2024. FINDINGS: Similar positioning of the dialysis catheter. Left IJ central venous line with tip in the region of the cavoatrial junction. Enteric tube with  tip in the proximal stomach. There is cardiomegaly with vascular congestion and edema. Pneumonia is not excluded no pleural effusion or pneumothorax. No acute osseous pathology. Partially visualized dilated bowel loops in the upper abdomen. IMPRESSION: 1. Left IJ central venous line with tip in the region of the cavoatrial junction. No pneumothorax. 2. Cardiomegaly with vascular congestion and edema. Electronically Signed   By: Vanetta Chou M.D.   On: 01/18/2024 14:48   DG Abd Portable 1V Result Date: 01/17/2024 CLINICAL DATA:  Small bowel obstruction. EXAM: PORTABLE ABDOMEN - 1 VIEW COMPARISON:  Abdominal radiograph dated 01/16/2024. FINDINGS: Decreased gaseous distention of the stomach. Similar gas distended loops of small bowel measuring up to 4.3 cm in diameter, concerning for small-bowel obstruction. Enteric tube within the stomach. No evidence of free air. IMPRESSION: Similar gas distended loops of small bowel measuring up to 4.3 cm, concerning for small-bowel obstruction. Enteric tube within the stomach. Electronically Signed   By: Harrietta Sherry M.D.   On: 01/17/2024 15:30   US  EKG SITE RITE Result Date: 01/17/2024 If Site Rite image not attached, placement could not be confirmed due to current cardiac rhythm.  DG Abd 1 View Result Date: 01/16/2024 CLINICAL DATA:  Nausea and vomiting EXAM: ABDOMEN - 1 VIEW COMPARISON:  01/09/2024 FINDINGS: Scattered large and small bowel gas is noted. The stomach is mildly distended. Distended loops of small bowel are noted without definitive transition zone. Gastric catheter is noted with the tip in the stomach. Proximal side port lies in the distal esophagus. This could be advanced further into the stomach. No free  air is seen. IMPRESSION: Changes suggestive of small-bowel obstruction. CT would be helpful for further evaluation. Electronically Signed   By: Oneil Devonshire M.D.   On: 01/16/2024 22:19   US  EKG SITE RITE Result Date: 01/16/2024 If [facility] image not attached, placement could not be confirmed due to current cardiac rhythm.  US  ABDOMEN LIMITED WITH LIVER DOPPLER Result Date: 01/11/2024 CLINICAL DATA:  Hyperbilirubinemia EXAM: DUPLEX ULTRASOUND OF LIVER TECHNIQUE: Color and duplex Doppler ultrasound was performed to evaluate the hepatic in-flow and out-flow vessels. COMPARISON:  CT abdomen pelvis with contrast 01/09/2024 FINDINGS: Liver: Normal parenchymal echogenicity. Mild diffuse nodularity of the hepatic contours. No focal lesion, mass or intrahepatic biliary ductal dilatation. Main Portal Vein size: 1.0 cm Portal Vein Velocities Main Prox:  43 cm/sec Main Mid: 40 cm/sec Main Dist:  45 cm/sec Right: 31 cm/sec Left: 22 cm/sec Hepatic Vein Velocities Right:  85 cm/sec Middle:  94 cm/sec Left:  57 cm/sec IVC: Present and patent with normal respiratory phasicity. Hepatic Artery Velocity:  97 cm/sec Splenic Vein Velocity:  19 cm/sec Spleen: Obscured by shadowing bowel gas. Portal Vein Occlusion/Thrombus: No Splenic Vein Occlusion/Thrombus: No Ascites: None Varices: None IMPRESSION: 1. Patent portal vein with appropriate direction of flow. 2. Nodular hepatic contour suspicious for cirrhosis. No focal hepatic lesion. 3. Unable to evaluate the spleen as it is obscured by overlying shadowing bowel gas. Spleen appears normal on recent CT from 01/09/2024. 4. Mild nonspecific thickening of the gallbladder wall, most likely a combination of underdistention and chronic hepatitis. Electronically Signed   By: Aliene Lloyd M.D.   On: 01/11/2024 07:55   CT ABDOMEN PELVIS W CONTRAST Result Date: 01/09/2024 CLINICAL DATA:  Acute, non localized abdominal pain. Skin breakdown underneath pannus in the left lower quadrant. EXAM:  CT ABDOMEN AND PELVIS WITH CONTRAST TECHNIQUE: Multidetector CT imaging of the abdomen and pelvis was performed using the standard protocol following  bolus administration of intravenous contrast. RADIATION DOSE REDUCTION: This exam was performed according to the departmental dose-optimization program which includes automated exposure control, adjustment of the mA and/or kV according to patient size and/or use of iterative reconstruction technique. CONTRAST:  75mL OMNIPAQUE  IOHEXOL  350 MG/ML SOLN COMPARISON:  01/03/2024 FINDINGS: Lower chest: Stable enlarged heart. Decreased size of the 2 cm thick chronic pericardial effusion, currently measuring 1.2 cm in thickness mild diffuse ground-glass opacity at both lung bases no significant change. No pleural fluid. Stable small right lower lobe calcified granuloma. Hepatobiliary: Unremarkable liver. The gallbladder remains contracted and poorly visualized. Pancreas: Unremarkable. No pancreatic ductal dilatation or surrounding inflammatory changes. Spleen: Normal in size without focal abnormality. Adrenals/Urinary Tract: Stable mild left adrenal hyperplasia. Normal-appearing right adrenal gland, kidneys and ureters. The urinary bladder is empty and grossly unremarkable. Stomach/Bowel: Unremarkable stomach, small bowel and colon. The appendix is not visualized. No secondary signs of appendicitis. Vascular/Lymphatic: No significant vascular findings are present. No enlarged abdominal or pelvic lymph nodes. Reproductive: Prostate is unremarkable. Other: Bilateral cutaneous and subcutaneous edema with little change. No soft tissue gas. Stable areas of subcutaneous fat necrosis and scarring in the sacral region. Small umbilical and right inguinal hernias containing fat. Musculoskeletal: Minimal lower thoracic spine degenerative changes. No evidence of osteomyelitis. IMPRESSION: 1. No acute abnormality. 2. Stable cardiomegaly. 3. Decreased size of the chronic pericardial effusion.  4. Stable mild diffuse ground-glass opacity at both lung bases, likely due to interstitial edema 5. Stable bilateral cutaneous and subcutaneous edema. 6. Stable areas of subcutaneous fat necrosis and scarring in the sacral region. Electronically Signed   By: Elspeth Bathe M.D.   On: 01/09/2024 14:23   US  Abdomen Limited RUQ (LIVER/GB) Result Date: 01/03/2024 CLINICAL DATA:  Jaundice EXAM: ULTRASOUND ABDOMEN LIMITED RIGHT UPPER QUADRANT COMPARISON:  CT today FINDINGS: Gallbladder: Gallbladder is contracted. No visible stones. Negative sonographic Murphy sign. Common bile duct: Diameter: Normal caliber, 2 mm. Liver: No focal lesion identified. Within normal limits in parenchymal echogenicity. Portal vein is patent on color Doppler imaging with normal direction of blood flow towards the liver. Other: None. IMPRESSION: No acute findings. Contracted gallbladder. Electronically Signed   By: Franky Crease M.D.   On: 01/03/2024 19:39   CT CHEST ABDOMEN PELVIS W CONTRAST Result Date: 01/03/2024 CLINICAL DATA:  Sepsis. EXAM: CT CHEST, ABDOMEN, AND PELVIS WITH CONTRAST TECHNIQUE: Multidetector CT imaging of the chest, abdomen and pelvis was performed following the standard protocol during bolus administration of intravenous contrast. RADIATION DOSE REDUCTION: This exam was performed according to the departmental dose-optimization program which includes automated exposure control, adjustment of the mA and/or kV according to patient size and/or use of iterative reconstruction technique. CONTRAST:  OMNIPAQUE  IOHEXOL  350 MG/ML SOLN COMPARISON:  Chest radiograph dated 01/03/2024 and CT pelvis dated 11/01/2023. FINDINGS: Evaluation of this exam is limited due to respiratory motion as well as streak artifact caused by patient's arms. CT CHEST FINDINGS Cardiovascular: Mild chronic pericardial effusion adjacent to the right atrium measures 2 cm in thickness. Central venous catheter with tip in the right atrium. The thoracic  aorta is unremarkable. The origins of the great vessels of the aortic arch and the central pulmonary arteries are patent. Mediastinum/Nodes: No obvious hilar or mediastinal adenopathy. The esophagus is grossly unremarkable. No mediastinal fluid collection. Lungs/Pleura: Interstitial and interlobular septal prominence with diffuse ground-glass density throughout the lungs most consistent with edema. Pneumonia is not excluded. No pleural effusion or pneumothorax. The central airways are patent. Musculoskeletal: No acute  osseous pathology. CT ABDOMEN PELVIS FINDINGS No intra-abdominal free air or free fluid. Hepatobiliary: The liver is unremarkable. No biliary dilatation. The gallbladder is from the visualized and appears contracted. No calcified gallstone. Pancreas: Unremarkable. No pancreatic ductal dilatation or surrounding inflammatory changes. Spleen: Normal in size without focal abnormality. Adrenals/Urinary Tract: The adrenal glands are unremarkable. There is no hydronephrosis on either side. The urinary bladder is collapsed. Stomach/Bowel: There is no bowel obstruction. The appendix is normal. Vascular/Lymphatic: Mild aortoiliac atherosclerotic disease. The IVC is unremarkable. No portal venous gas. There is no adenopathy. Reproductive: The prostate and seminal vesicles are grossly unremarkable. No pelvic mass. Other: There is diffuse subcutaneous edema.  No fluid collection. Musculoskeletal: Irregular appearance of the subcutaneous fat superficial to the sacrum similar to prior CT may be sequela of prior inflammatory/infectious process or represent areas of fat necrosis. No fluid collection. No acute osseous pathology. IMPRESSION: 1. Pulmonary edema. Pneumonia is not excluded. 2. No bowel obstruction. Normal appendix. 3. No fluid collection. 4.  Aortic Atherosclerosis (ICD10-I70.0). Electronically Signed   By: Vanetta Chou M.D.   On: 01/03/2024 16:44   DG Chest 2 View Result Date: 01/03/2024 CLINICAL  DATA:  Sepsis. EXAM: CHEST - 2 VIEW COMPARISON:  Chest x-ray 12/11/2023 and older FINDINGS: Markedly limited examination. The lateral view is nondiagnostic. Frontal view is under penetrated. Enlarged cardiopericardial silhouette as on prior with vascular congestion. Increasing pleural effusions. No pneumothorax. Right IJ double-lumen catheter with tip overlying the right atrium. Overlapping cardiac leads. IMPRESSION: Extremely limited examination as above. The lateral view is nondiagnostic. Enlarged cardiopericardial silhouette with increasing vascular congestion and pleural effusions. Right IJ double-lumen catheter with tip lower along the mediastinum, possibly right atrial. Please correlate with history. Electronically Signed   By: Ranell Bring M.D.   On: 01/03/2024 15:17    Microbiology No results found for this or any previous visit (from the past 240 hours).  Lab Basic Metabolic Panel: Recent Labs  Lab 01/24/24 0411 01/25/24 0500 01/26/24 0524 01/26/24 0944 01/26/24 1456 01/26/24 1610 01/27/24 1005 02-03-2024 0416 02/03/2024 0730  NA 132* 132* 134*   < > 135 139 133* 128* 133*  K 5.4* 5.3* 4.0   < > 4.5 3.9 4.9 5.3* 6.7*  CL 92* 90* 93*  --   --  100 90*  --  93*  CO2 17* 15* 20*  --   --  18* 18*  --  <7*  GLUCOSE 90 56* 46*  --   --  71 140*  --  <20*  BUN 61* 71* 46*  --   --  44* 59*  --  73*  CREATININE 4.05* 4.58* 3.28*  --   --  3.17* 4.22*  --  4.80*  CALCIUM  8.3* 8.2* 8.4*  --   --  7.4* 8.3*  --  9.2  PHOS 5.7*  --   --   --   --   --   --   --   --    < > = values in this interval not displayed.   Liver Function Tests: Recent Labs  Lab 01/25/24 0500 01/26/24 0524 01/26/24 1610 01/27/24 1005 February 03, 2024 0730  AST 35 38 39 44* 66*  ALT 28 26 24 28 27   ALKPHOS 182* 148* 125 148* 142*  BILITOT 22.3* 21.0* 17.8* 21.3* 18.4*  PROT 5.2* 4.8* 4.0* 4.7* 4.4*  ALBUMIN  1.8* 2.0* 1.7* 1.7* <1.5*   No results for input(s): LIPASE, AMYLASE in the last 168 hours. Recent  Labs  Lab 01/26/24 0524  AMMONIA 72*   CBC: Recent Labs  Lab 01/24/24 1306 01/25/24 0500 01/26/24 0524 01/26/24 0944 01/26/24 1610 01/26/24 1730 01/27/24 1005 19-Feb-2024 0416 2024/02/19 0730  WBC 42.0*   < > 36.6*  --  29.9* 44.0* 44.3*  --  37.5*  NEUTROABS 38.6*  --  34.4*  --  27.5*  --  44.3*  --  32.2*  HGB 7.7*   < > 6.4*   < > 4.7* 6.8* 6.7* 6.1* 6.1*  HCT 22.3*   < > 18.3*   < > 14.0* 20.2* 18.5* 18.0* 19.1*  MCV 86.1   < > 84.7  --  89.7 87.1 86.0  --  94.6  PLT 81*   < > 73*  --  59* 89* 85*  --  61*   < > = values in this interval not displayed.   Cardiac Enzymes: No results for input(s): CKTOTAL, CKMB, CKMBINDEX, TROPONINI in the last 168 hours. Sepsis Labs: Recent Labs  Lab 01/24/24 1306 01/24/24 1453 01/25/24 0857 01/25/24 1142 01/25/24 1300 01/26/24 0524 01/26/24 0743 01/26/24 1046 01/26/24 1610 01/26/24 1730 01/27/24 1005 01/27/24 1030 2024-02-19 0730  PROCALCITON 32.57  --  32.16  --   --  27.74  --   --   --   --   --   --  29.38  WBC 42.0*   < >  --   --   --  36.6*  --   --  29.9* 44.0* 44.3*  --  37.5*  LATICACIDVEN 3.9*   < >  --    < > 2.8*  --  3.6* 6.1*  --   --   --  5.1*  --    < > = values in this interval not displayed.    Procedures/Operations  ACLS Endotracheal Intubation Central Line place Arterial Line placement   Dorn KATHEE Chill February 19, 2024, 3:50 PM

## 2024-02-24 DEATH — deceased

## 2024-05-07 LAB — YEAST SUSCEPTIBILITIES
Amphotericin B MIC: 0.5
Anidulafungin MIC: 1
Caspofungin MIC: 1
Fluconazole Islt MIC: 64
ISAVUCONAZOLE MIC: 0.25
Itraconazole MIC: 0.5
Micafungin MIC: 1
Posaconazole MIC: 0.5
REZAFUNGIN MIC: 1
Voriconazole MIC: 1
